# Patient Record
Sex: Male | Born: 1950 | Race: White | Hispanic: No | Marital: Single | Smoking: Former smoker
Health system: Southern US, Community
[De-identification: ages and names within clinical notes are randomized; demographics above are authoritative.]

## PROBLEM LIST (undated history)

## (undated) ENCOUNTER — Emergency Department (HOSPITAL_COMMUNITY): Payer: No Typology Code available for payment source

## (undated) DIAGNOSIS — I219 Acute myocardial infarction, unspecified: Secondary | ICD-10-CM

## (undated) DIAGNOSIS — F419 Anxiety disorder, unspecified: Secondary | ICD-10-CM

## (undated) DIAGNOSIS — J930 Spontaneous tension pneumothorax: Secondary | ICD-10-CM

## (undated) DIAGNOSIS — J449 Chronic obstructive pulmonary disease, unspecified: Secondary | ICD-10-CM

## (undated) DIAGNOSIS — H209 Unspecified iridocyclitis: Secondary | ICD-10-CM

## (undated) DIAGNOSIS — F329 Major depressive disorder, single episode, unspecified: Secondary | ICD-10-CM

## (undated) DIAGNOSIS — J40 Bronchitis, not specified as acute or chronic: Secondary | ICD-10-CM

## (undated) DIAGNOSIS — Z9981 Dependence on supplemental oxygen: Secondary | ICD-10-CM

## (undated) DIAGNOSIS — I10 Essential (primary) hypertension: Secondary | ICD-10-CM

## (undated) DIAGNOSIS — T1491XA Suicide attempt, initial encounter: Secondary | ICD-10-CM

## (undated) DIAGNOSIS — G4733 Obstructive sleep apnea (adult) (pediatric): Secondary | ICD-10-CM

## (undated) DIAGNOSIS — Z923 Personal history of irradiation: Secondary | ICD-10-CM

## (undated) DIAGNOSIS — F32A Depression, unspecified: Secondary | ICD-10-CM

## (undated) DIAGNOSIS — F99 Mental disorder, not otherwise specified: Secondary | ICD-10-CM

## (undated) HISTORY — PX: OTHER SURGICAL HISTORY: SHX169

## (undated) HISTORY — PX: SKIN CANCER EXCISION: SHX779

---

## 2013-01-09 ENCOUNTER — Emergency Department (HOSPITAL_COMMUNITY): Payer: Non-veteran care

## 2013-01-09 ENCOUNTER — Encounter (HOSPITAL_COMMUNITY): Payer: Self-pay | Admitting: Internal Medicine

## 2013-01-09 ENCOUNTER — Observation Stay (HOSPITAL_COMMUNITY)
Admission: EM | Admit: 2013-01-09 | Discharge: 2013-01-11 | Disposition: A | Discharge status: Home / Self Care | Payer: Non-veteran care | Attending: Internal Medicine | Admitting: Internal Medicine

## 2013-01-09 DIAGNOSIS — R0789 Other chest pain: Secondary | ICD-10-CM | POA: Diagnosis not present

## 2013-01-09 DIAGNOSIS — I252 Old myocardial infarction: Secondary | ICD-10-CM

## 2013-01-09 DIAGNOSIS — F329 Major depressive disorder, single episode, unspecified: Secondary | ICD-10-CM | POA: Diagnosis not present

## 2013-01-09 DIAGNOSIS — E669 Obesity, unspecified: Secondary | ICD-10-CM | POA: Diagnosis present

## 2013-01-09 DIAGNOSIS — I10 Essential (primary) hypertension: Secondary | ICD-10-CM | POA: Insufficient documentation

## 2013-01-09 DIAGNOSIS — Z720 Tobacco use: Secondary | ICD-10-CM | POA: Diagnosis present

## 2013-01-09 DIAGNOSIS — Z6833 Body mass index (BMI) 33.0-33.9, adult: Secondary | ICD-10-CM | POA: Insufficient documentation

## 2013-01-09 DIAGNOSIS — F32A Depression, unspecified: Secondary | ICD-10-CM | POA: Diagnosis present

## 2013-01-09 DIAGNOSIS — E1159 Type 2 diabetes mellitus with other circulatory complications: Secondary | ICD-10-CM | POA: Diagnosis present

## 2013-01-09 DIAGNOSIS — F3289 Other specified depressive episodes: Secondary | ICD-10-CM | POA: Insufficient documentation

## 2013-01-09 DIAGNOSIS — F17200 Nicotine dependence, unspecified, uncomplicated: Secondary | ICD-10-CM | POA: Diagnosis not present

## 2013-01-09 DIAGNOSIS — Z23 Encounter for immunization: Secondary | ICD-10-CM | POA: Insufficient documentation

## 2013-01-09 DIAGNOSIS — R079 Chest pain, unspecified: Secondary | ICD-10-CM | POA: Diagnosis present

## 2013-01-09 DIAGNOSIS — R0602 Shortness of breath: Secondary | ICD-10-CM | POA: Insufficient documentation

## 2013-01-09 HISTORY — DX: Essential (primary) hypertension: I10

## 2013-01-09 LAB — CBC
HCT: 45.8 % (ref 39.0–52.0)
Hemoglobin: 16 g/dL (ref 13.0–17.0)
MCH: 31.9 pg (ref 26.0–34.0)
MCHC: 34.9 g/dL (ref 30.0–36.0)
MCV: 91.4 fL (ref 78.0–100.0)
Platelets: 238 10*3/uL (ref 150–400)
RBC: 5.01 MIL/uL (ref 4.22–5.81)
RDW: 13.9 % (ref 11.5–15.5)
WBC: 10.8 10*3/uL — ABNORMAL HIGH (ref 4.0–10.5)

## 2013-01-09 LAB — BASIC METABOLIC PANEL
BUN: 17 mg/dL (ref 6–23)
CO2: 25 mEq/L (ref 19–32)
Calcium: 8.7 mg/dL (ref 8.4–10.5)
Chloride: 103 mEq/L (ref 96–112)
Creatinine, Ser: 0.99 mg/dL (ref 0.50–1.35)
GFR calc Af Amer: >90 mL/min (ref >90)
GFR calc non Af Amer: 87 mL/min — ABNORMAL LOW (ref >90)
Glucose, Bld: 89 mg/dL (ref 70–99)
Potassium: 4.2 mEq/L (ref 3.5–5.1)
Sodium: 138 mEq/L (ref 135–145)

## 2013-01-09 LAB — POCT I-STAT TROPONIN I: Troponin i, poc: 0.02 ng/mL (ref 0.00–0.08)

## 2013-01-09 LAB — PRO B NATRIURETIC PEPTIDE: Pro B Natriuretic peptide (BNP): 229.7 pg/mL — ABNORMAL HIGH (ref 0–125)

## 2013-01-09 MED ORDER — MORPHINE SULFATE 4 MG/ML IJ SOLN
4.0000 mg | Freq: Once | INTRAMUSCULAR | Status: AC
  Administered 2013-01-09: 4 mg via INTRAVENOUS
  Filled 2013-01-09: qty 1

## 2013-01-09 MED ORDER — HYDROMORPHONE HCL PF 1 MG/ML IJ SOLN: 1.0000 mg | INTRAMUSCULAR | Status: AC | PRN

## 2013-01-09 MED ORDER — ONDANSETRON HCL 4 MG/2ML IJ SOLN
4.0000 mg | Freq: Once | INTRAMUSCULAR | Status: AC
  Administered 2013-01-09: 4 mg via INTRAVENOUS
  Filled 2013-01-09: qty 2

## 2013-01-09 MED ORDER — NITROGLYCERIN 0.4 MG SL SUBL
0.4000 mg | SUBLINGUAL_TABLET | SUBLINGUAL | Status: DC | PRN
  Administered 2013-01-09: 0.4 mg via SUBLINGUAL
  Filled 2013-01-09: qty 25

## 2013-01-09 MED ORDER — ONDANSETRON HCL 4 MG/2ML IJ SOLN: 4.0000 mg | Freq: Three times a day (TID) | INTRAMUSCULAR | Status: DC | PRN

## 2013-01-09 MED ORDER — ASPIRIN 325 MG PO TABS
325.0000 mg | ORAL_TABLET | Freq: Every day | ORAL | Status: DC
  Administered 2013-01-09: 325 mg via ORAL
  Filled 2013-01-09: qty 1

## 2013-01-09 NOTE — ED Provider Notes (Signed)
CSN: [id number]     Arrival date & time 01/09/13  2110 History   First MD Initiated Contact with Patient 01/09/13 2206     Chief Complaint  Patient presents with  . Chest Pain   (Consider location/radiation/quality/duration/timing/severity/associated sxs/prior Treatment) Patient is a 62 y.o. male presenting with chest pain. The history is provided by the patient and medical records.  Chest Pain Associated symptoms: diaphoresis and nausea    This is a 62 year old male with past medical history significant for hypertension and BPH, presenting to the ED for midsternal chest pressure with radiation to left side of his jaw.  Patient states "I feel like there is an elephant sitting on my chest".  States he has associated SOB, diaphoresis, and nausea.  No vomiting or palpitations.  No numbness/weakness of extremities.  Patient has no prior cardiac history. States he has had episodes of similar in the past, but has never been formally evaluated. Patient has no family history of heart disease. Patient is a daily smoker, parceling one pack per day which is decreased from his prior 1.5- 2 packs per day.  No intervention PTA.  No past medical history on file. No past surgical history on file. No family history on file. History  Substance Use Topics  . Smoking status: Not on file  . Smokeless tobacco: Not on file  . Alcohol Use: Not on file    Review of Systems  Constitutional: Positive for diaphoresis.  Cardiovascular: Positive for chest pain.  Gastrointestinal: Positive for nausea.  All other systems reviewed and are negative.    Allergies  Demerol; Zocor; and Other  Home Medications   Current Outpatient Rx  Name  Route  Sig  Dispense  Refill  . lisinopril (PRINIVIL,ZESTRIL) 20 MG tablet   Oral   Take 20 mg by mouth daily.         . tamsulosin (FLOMAX) 0.4 MG CAPS capsule   Oral   Take 0.4 mg by mouth.          BP 150/88  Pulse 77  Temp(Src) 97.8 F (36.6 C) (Oral)  Resp  20  Ht 6' 3.5" (1.918 m)  Wt 268 lb (121.564 kg)  BMI 33.05 kg/m2  SpO2 93%  Physical Exam  Nursing note and vitals reviewed. Constitutional: He is oriented to person, place, and time. He appears well-developed and well-nourished. No distress.  HENT:  Head: Normocephalic and atraumatic.  Mouth/Throat: Oropharynx is clear and moist.  Eyes: Conjunctivae and EOM are normal. Pupils are equal, round, and reactive to light.  Neck: Normal range of motion. Neck supple.  Cardiovascular: Normal rate, regular rhythm and normal heart sounds.   Pulmonary/Chest: Effort normal and breath sounds normal. No respiratory distress. He has no wheezes.  Abdominal: Soft. Bowel sounds are normal. There is no tenderness. There is no guarding.  Musculoskeletal: Normal range of motion. He exhibits edema.  Trace edema BLE  Neurological: He is alert and oriented to person, place, and time. He has normal strength. He displays no tremor. No cranial nerve deficit or sensory deficit. He displays no seizure activity.  CN grossly intact, moves all extremities appropriately without ataxia, no focal neuro deficits or facial droop appreciated  Skin: Skin is warm and dry. He is not diaphoretic.  Psychiatric: He has a normal mood and affect.    ED Course  Procedures (including critical care time)   Date: 01/10/2013  Rate: 91  Rhythm: normal sinus rhythm  QRS Axis: normal  Intervals: normal  ST/T  Wave abnormalities: normal  Conduction Disutrbances:none  Narrative Interpretation:   Old EKG Reviewed: none available  Labs Review Labs Reviewed  CBC - Abnormal; Notable for the following:    WBC 10.8 (*)    All other components within normal limits  BASIC METABOLIC PANEL - Abnormal; Notable for the following:    GFR calc non Af Amer 87 (*)    All other components within normal limits  PRO B NATRIURETIC PEPTIDE - Abnormal; Notable for the following:    Pro B Natriuretic peptide (BNP) 229.7 (*)    All other  components within normal limits  POCT I-STAT TROPONIN I   Imaging Review Dg Chest 2 View  01/09/2013   CLINICAL DATA:  Chest pain.  EXAM: CHEST  2 VIEW  COMPARISON:  None.  FINDINGS: Normal sized heart. Small amount of linear density at the left lung base. Otherwise, clear lungs. The lungs are mildly hyperexpanded. Mild central peribronchial thickening. Mild thoracic spine degenerative changes.  IMPRESSION: 1. Mild changes of COPD and chronic bronchitis. 2. Minimal left basilar linear atelectasis or scarring.   Electronically Signed   By: Gordan Payment M.D.   On: 01/09/2013 23:24    EKG Interpretation   None       MDM   1. Chest pain    EKG normal sinus rhythm, no acute ischemic changes. Troponin negative. Labs as above, largely within normal limits. BNP mildly elevated at 229. Patient given ASA and 2 SL nitroglycerin with resolution of his pain, but did cause a headache.  Given pts sx on arrival and story of prior instances of the same, i feel he would benefit from admission for close monitoring and serial enzymes.  Consulted unassigned, spoke with Dr, Toniann Fail-- pt to be admitted to Telemetry.  Temp admit orders placed.  VS stable for transport to floor.  Garlon Hatchet, PA-C 01/10/13 [phone]

## 2013-01-09 NOTE — ED Notes (Signed)
Pt sts dizziness is worsening. And feels like he is going to pass out.

## 2013-01-09 NOTE — ED Notes (Signed)
Pt c/o substernal CP that radiates to jaw, with diaphoresis, nausea. He states that pain has decreased.

## 2013-01-10 ENCOUNTER — Encounter (HOSPITAL_COMMUNITY): Payer: Self-pay | Admitting: Internal Medicine

## 2013-01-10 DIAGNOSIS — I10 Essential (primary) hypertension: Secondary | ICD-10-CM | POA: Diagnosis present

## 2013-01-10 DIAGNOSIS — E1159 Type 2 diabetes mellitus with other circulatory complications: Secondary | ICD-10-CM | POA: Diagnosis present

## 2013-01-10 DIAGNOSIS — R079 Chest pain, unspecified: Secondary | ICD-10-CM | POA: Diagnosis present

## 2013-01-10 LAB — BASIC METABOLIC PANEL
BUN: 16 mg/dL (ref 6–23)
CO2: 24 mEq/L (ref 19–32)
Calcium: 8.4 mg/dL (ref 8.4–10.5)
Chloride: 103 mEq/L (ref 96–112)
Creatinine, Ser: 0.88 mg/dL (ref 0.50–1.35)
GFR calc Af Amer: >90 mL/min (ref >90)
GFR calc non Af Amer: >90 mL/min (ref >90)
Glucose, Bld: 85 mg/dL (ref 70–99)
Potassium: 3.8 mEq/L (ref 3.5–5.1)
Sodium: 137 mEq/L (ref 135–145)

## 2013-01-10 LAB — CBC
HCT: 43.3 % (ref 39.0–52.0)
Hemoglobin: 14.8 g/dL (ref 13.0–17.0)
MCH: 31.4 pg (ref 26.0–34.0)
MCHC: 34.2 g/dL (ref 30.0–36.0)
MCV: 91.7 fL (ref 78.0–100.0)
Platelets: 205 10*3/uL (ref 150–400)
RBC: 4.72 MIL/uL (ref 4.22–5.81)
RDW: 13.8 % (ref 11.5–15.5)
WBC: 12 10*3/uL — ABNORMAL HIGH (ref 4.0–10.5)

## 2013-01-10 LAB — HEPATIC FUNCTION PANEL
ALT: 11 U/L (ref 0–53)
AST: 18 U/L (ref 0–37)
Albumin: 3.1 g/dL — ABNORMAL LOW (ref 3.5–5.2)
Alkaline Phosphatase: 87 U/L (ref 39–117)
Bilirubin, Direct: <0.1 mg/dL (ref 0.0–0.3)
Total Bilirubin: 0.3 mg/dL (ref 0.3–1.2)
Total Protein: 6.3 g/dL (ref 6.0–8.3)

## 2013-01-10 LAB — TROPONIN I
Troponin I: <0.3 ng/mL (ref <0.30)
Troponin I: <0.3 ng/mL (ref <0.30)
Troponin I: <0.3 ng/mL (ref <0.30)

## 2013-01-10 LAB — D-DIMER, QUANTITATIVE: D-Dimer, Quant: 0.32 ug/mL-FEU (ref 0.00–0.48)

## 2013-01-10 MED ORDER — HYDRALAZINE HCL 20 MG/ML IJ SOLN: 10.0000 mg | INTRAMUSCULAR | Status: DC | PRN

## 2013-01-10 MED ORDER — ENOXAPARIN SODIUM 40 MG/0.4ML ~~LOC~~ SOLN
40.0000 mg | SUBCUTANEOUS | Status: DC
  Administered 2013-01-10 – 2013-01-11 (×2): 40 mg via SUBCUTANEOUS
  Filled 2013-01-10 (×2): qty 0.4

## 2013-01-10 MED ORDER — SODIUM CHLORIDE 0.9 % IJ SOLN
3.0000 mL | Freq: Two times a day (BID) | INTRAMUSCULAR | Status: DC
  Administered 2013-01-10 – 2013-01-11 (×3): 3 mL via INTRAVENOUS

## 2013-01-10 MED ORDER — SODIUM CHLORIDE 0.9 % IJ SOLN
3.0000 mL | Freq: Two times a day (BID) | INTRAMUSCULAR | Status: DC
  Administered 2013-01-10: 3 mL via INTRAVENOUS

## 2013-01-10 MED ORDER — INFLUENZA VAC SPLIT QUAD 0.5 ML IM SUSP
0.5000 mL | INTRAMUSCULAR | Status: AC
  Administered 2013-01-11: 0.5 mL via INTRAMUSCULAR
  Filled 2013-01-10: qty 0.5

## 2013-01-10 MED ORDER — ASPIRIN EC 81 MG PO TBEC
81.0000 mg | DELAYED_RELEASE_TABLET | Freq: Every day | ORAL | Status: DC
  Administered 2013-01-10 – 2013-01-11 (×2): 81 mg via ORAL
  Filled 2013-01-10 (×2): qty 1

## 2013-01-10 MED ORDER — ACETAMINOPHEN 325 MG PO TABS
650.0000 mg | ORAL_TABLET | Freq: Four times a day (QID) | ORAL | Status: DC | PRN
  Administered 2013-01-10 (×2): 650 mg via ORAL
  Filled 2013-01-10 (×2): qty 2

## 2013-01-10 MED ORDER — ONDANSETRON HCL 4 MG PO TABS: 4.0000 mg | ORAL_TABLET | Freq: Four times a day (QID) | ORAL | Status: DC | PRN

## 2013-01-10 MED ORDER — ONDANSETRON HCL 4 MG/2ML IJ SOLN: 4.0000 mg | Freq: Four times a day (QID) | INTRAMUSCULAR | Status: DC | PRN

## 2013-01-10 MED ORDER — TAMSULOSIN HCL 0.4 MG PO CAPS
0.4000 mg | ORAL_CAPSULE | Freq: Every day | ORAL | Status: DC
  Administered 2013-01-10 – 2013-01-11 (×2): 0.4 mg via ORAL
  Filled 2013-01-10 (×2): qty 1

## 2013-01-10 MED ORDER — ASPIRIN EC 325 MG PO TBEC
325.0000 mg | DELAYED_RELEASE_TABLET | Freq: Every day | ORAL | Status: DC
  Administered 2013-01-10: 325 mg via ORAL
  Filled 2013-01-10: qty 1

## 2013-01-10 MED ORDER — PNEUMOCOCCAL VAC POLYVALENT 25 MCG/0.5ML IJ INJ
0.5000 mL | INJECTION | INTRAMUSCULAR | Status: AC
  Administered 2013-01-11: 0.5 mL via INTRAMUSCULAR
  Filled 2013-01-10: qty 0.5

## 2013-01-10 MED ORDER — ACETAMINOPHEN 650 MG RE SUPP: 650.0000 mg | Freq: Four times a day (QID) | RECTAL | Status: DC | PRN

## 2013-01-10 NOTE — H&P (Signed)
[facility] History and Physical  Mario [name] ZOX:[id number] DOB: 01/15/1951 DOA: 01/09/2013  Referring physician: ER physician. PCP: No PCP Per Patient   Chief Complaint: Chest pain.  HPI: Mario [name] is a 61 y.o. male with history of hypertension presently on no medications for last one year due to financial issues, ongoing tobacco abuse presented to the ER because of chest pain. Patient was driving car at around 8 PM when he started developing retrosternal chest pain which was associated with shortness of breath and nausea. Chest pain did not get better he came to the ER. EKG and cardiac markers were negative and chest x-ray did not show anything acute. Patient's chest pain got better with nitroglycerin and has been admitted for further management. Patient denies any abdominal pain diarrhea fever chills productive cough.   Review of Systems: As presented in the history of presenting illness, rest negative.  Past Medical History  Diagnosis Date  . Hypertension    Past Surgical History  Procedure Laterality Date  . Skin cancer excision    . Cryptorchidism     Social History:  reports that he has been smoking.  He does not have any smokeless tobacco history on file. He reports that he does not drink alcohol or use illicit drugs. Where does patient live home. Can patient participate in ADLs? Yes.  Allergies  Allergen Reactions  . Demerol [Meperidine] Nausea And Vomiting    Violently sick  . Zocor [Simvastatin] Other (See Comments)    Made him very jittery, nausea and vomiting  . Other Other (See Comments)    Some antibiotic-not sure which one-caused nausea and vomiting.      Family History:  Family History  Problem Relation Age of Onset  . Dementia Father       Prior to Admission medications   Medication Sig Start Date End Date Taking? Authorizing Provider  lisinopril (PRINIVIL,ZESTRIL) 20 MG tablet Take 20 mg by mouth daily.    Historical Provider, MD   tamsulosin (FLOMAX) 0.4 MG CAPS capsule Take 0.4 mg by mouth.    Historical Provider, MD    Physical Exam: Filed Vitals:   01/09/13 2310 01/09/13 2315 01/09/13 2330 01/10/13 0018  BP: 130/80 150/88 153/96 132/93  Pulse: 85 77 70 71  Temp:      TempSrc:      Resp:  20 17 18   Height:      Weight:      SpO2:  93% 96% 95%     General:  Well-developed and nourished.  Eyes: Anicteric no pallor.  ENT: No discharge from ears eyes nose mouth.  Neck: No mass felt.  Cardiovascular: S1-S2 heard.  Respiratory: No rhonchi or crepitations.  Abdomen: Soft nontender bowel sounds present.  Skin: Chronic skin changes in the lower extremities.  Musculoskeletal: No edema.  Psychiatric: Appears normal.  Neurologic: Alert awake oriented to time place and person. Moves all extremities.  Labs on Admission:  Basic Metabolic Panel:  Recent Labs Lab 01/09/13 2123  NA 138  K 4.2  CL 103  CO2 25  GLUCOSE 89  BUN 17  CREATININE 0.99  CALCIUM 8.7   Liver Function Tests: No results found for this basename: AST, ALT, ALKPHOS, BILITOT, PROT, ALBUMIN,  in the last 168 hours No results found for this basename: LIPASE, AMYLASE,  in the last 168 hours No results found for this basename: AMMONIA,  in the last 168 hours CBC:  Recent Labs Lab 01/09/13 2123  WBC 10.8*  HGB 16.0  HCT 45.8  MCV 91.4  PLT 238   Cardiac Enzymes: No results found for this basename: CKTOTAL, CKMB, CKMBINDEX, TROPONINI,  in the last 168 hours  BNP (last 3 results)  Recent Labs  01/09/13 2122  PROBNP 229.7*   CBG: No results found for this basename: GLUCAP,  in the last 168 hours  Radiological Exams on Admission: Dg Chest 2 View  01/09/2013   CLINICAL DATA:  Chest pain.  EXAM: CHEST  2 VIEW  COMPARISON:  None.  FINDINGS: Normal sized heart. Small amount of linear density at the left lung base. Otherwise, clear lungs. The lungs are mildly hyperexpanded. Mild central peribronchial thickening. Mild  thoracic spine degenerative changes.  IMPRESSION: 1. Mild changes of COPD and chronic bronchitis. 2. Minimal left basilar linear atelectasis or scarring.   Electronically Signed   By: Gordan Payment M.D.   On: 01/09/2013 23:24    EKG: Independently reviewed. Normal sinus rhythm.  Assessment/Plan Principal Problem:   Chest pain Active Problems:   HTN (hypertension)   1. Chest pain - patient's chest pain has typical features and with history of ongoing tobacco abuse and history of hypertension we will cycle cardiac markers check 2-D echo aspirin when necessary nitroglycerin and check d-dimer. 2. Hypertension - noncompliant with medications due to financial issues. For now I have placed patient on when necessary IV hydralazine for systolic blood pressure more than 160. Based on patient's blood pressure trends patient will need different antihypertensives eventually. 3. Tobacco abuse - strongly advised to quit smoking.    Code Status: Full code.  Family Communication: None.  Disposition Plan: Admit for observation.    Sandrika Schwinn N. [facility] Pager [phone].  If 7PM-7AM, please contact night-coverage [email or web address] Password [facility] 01/10/2013, 12:31 AM

## 2013-01-10 NOTE — ED Provider Notes (Signed)
Medical screening examination/treatment/procedure(s) were performed by non-physician practitioner and as supervising physician I was immediately available for consultation/collaboration.  EKG Interpretation     Ventricular Rate:    PR Interval:    QRS Duration:   QT Interval:    QTC Calculation:   R Axis:     Text Interpretation:                Junius Argyle, MD 01/10/13 [phone]

## 2013-01-10 NOTE — Progress Notes (Signed)
Nutrition Brief Note  Patient identified on the Malnutrition Screening Tool (MST) Report for recent weight lost without trying (patient unsure) and eating poorly because of a decreased appetite.  Wt Readings from Last 15 Encounters:  01/10/13 266 lb 4.8 oz (120.793 kg)    Body mass index is 32.84 kg/(m^2). Patient meets criteria for Obesity Class I based on current BMI.   Current diet order is Heart Healthy, patient is consuming approximately 75% of meals at this time. Labs and medications reviewed.   No nutrition interventions warranted at this time. If nutrition issues arise, please consult RD.   Maureen Chatters, RD, LDN Pager #: [phone] After-Hours Pager #: [phone]

## 2013-01-10 NOTE — Progress Notes (Signed)
Patient admitted after midnight.  Chart reviewed.  Feels exhausted. Occasional shortness of breath. No chest pain. Not interested in quitting smoking. No history of previous stress test. No PCP. "Gave up" when he lost his job. Has been very depressed. Prefers to have inpatient ischemia workup rather than outpatient followup. Have discussed the case with Dr. Excell Seltzer. D-dimer normal. Cardiac enzymes negative. Echo not yet done. Would benefit from a Myoview.  He agrees to scheduling a Myoview for tomorrow. No need for them to console unless abnormal. Will discuss treatment of depression with the patient further tomorrow. He reports having been on antidepressants in the past.  Crista Curb M.D. [phone]

## 2013-01-10 NOTE — Progress Notes (Signed)
UR COMPLETED  

## 2013-01-11 ENCOUNTER — Emergency Department (HOSPITAL_COMMUNITY)
Admission: EM | Admit: 2013-01-11 | Discharge: 2013-01-12 | Disposition: A | Discharge status: Other Acute Inpatient Hospital | Payer: Non-veteran care | Attending: Emergency Medicine | Admitting: Emergency Medicine

## 2013-01-11 ENCOUNTER — Observation Stay (HOSPITAL_COMMUNITY): Payer: Non-veteran care

## 2013-01-11 ENCOUNTER — Encounter (HOSPITAL_COMMUNITY): Payer: Non-veteran care

## 2013-01-11 DIAGNOSIS — F329 Major depressive disorder, single episode, unspecified: Secondary | ICD-10-CM | POA: Diagnosis present

## 2013-01-11 DIAGNOSIS — Z720 Tobacco use: Secondary | ICD-10-CM | POA: Diagnosis present

## 2013-01-11 DIAGNOSIS — R079 Chest pain, unspecified: Secondary | ICD-10-CM

## 2013-01-11 DIAGNOSIS — Z79899 Other long term (current) drug therapy: Secondary | ICD-10-CM | POA: Insufficient documentation

## 2013-01-11 DIAGNOSIS — Z7982 Long term (current) use of aspirin: Secondary | ICD-10-CM | POA: Insufficient documentation

## 2013-01-11 DIAGNOSIS — E669 Obesity, unspecified: Secondary | ICD-10-CM | POA: Diagnosis present

## 2013-01-11 DIAGNOSIS — I10 Essential (primary) hypertension: Secondary | ICD-10-CM | POA: Insufficient documentation

## 2013-01-11 DIAGNOSIS — R45851 Suicidal ideations: Secondary | ICD-10-CM

## 2013-01-11 DIAGNOSIS — F32A Depression, unspecified: Secondary | ICD-10-CM | POA: Diagnosis present

## 2013-01-11 DIAGNOSIS — I252 Old myocardial infarction: Secondary | ICD-10-CM

## 2013-01-11 DIAGNOSIS — F17200 Nicotine dependence, unspecified, uncomplicated: Secondary | ICD-10-CM | POA: Insufficient documentation

## 2013-01-11 LAB — LIPID PANEL
Cholesterol: 136 mg/dL (ref 0–200)
HDL: 29 mg/dL — ABNORMAL LOW (ref >39)
LDL Cholesterol: 90 mg/dL (ref 0–99)
Total CHOL/HDL Ratio: 4.7 RATIO
Triglycerides: 86 mg/dL (ref <150)
VLDL: 17 mg/dL (ref 0–40)

## 2013-01-11 LAB — TSH: TSH: 1.111 u[IU]/mL (ref 0.350–4.500)

## 2013-01-11 MED ORDER — REGADENOSON 0.4 MG/5ML IV SOLN
0.4000 mg | Freq: Once | INTRAVENOUS | Status: AC
  Administered 2013-01-11: 0.4 mg via INTRAVENOUS

## 2013-01-11 MED ORDER — BUPROPION HCL ER (XL) 150 MG PO TB24: 150.0000 mg | ORAL_TABLET | Freq: Every day | ORAL | Status: DC

## 2013-01-11 MED ORDER — TECHNETIUM TC 99M SESTAMIBI GENERIC - CARDIOLITE
30.0000 | Freq: Once | INTRAVENOUS | Status: AC | PRN
  Administered 2013-01-11: 30 via INTRAVENOUS

## 2013-01-11 MED ORDER — TECHNETIUM TC 99M SESTAMIBI GENERIC - CARDIOLITE
10.0000 | Freq: Once | INTRAVENOUS | Status: AC | PRN
  Administered 2013-01-11: 10 via INTRAVENOUS

## 2013-01-11 MED ORDER — REGADENOSON 0.4 MG/5ML IV SOLN
INTRAVENOUS | Status: AC
  Filled 2013-01-11: qty 5

## 2013-01-11 MED ORDER — LISINOPRIL 20 MG PO TABS: 20.0000 mg | ORAL_TABLET | Freq: Every day | ORAL | Status: DC

## 2013-01-11 MED ORDER — ASPIRIN 81 MG PO TBEC: 81.0000 mg | DELAYED_RELEASE_TABLET | Freq: Every day | ORAL | Status: DC

## 2013-01-11 NOTE — Care Management Note (Signed)
    Page 1 of 1   01/11/2013     5:24:04 PM   CARE MANAGEMENT NOTE 01/11/2013  Patient:  Bjorklund,Page   Account Number:  [id number]  Date Initiated:  01/11/2013  Documentation initiated by:  Dossie Ocanas  Subjective/Objective Assessment:   PT ADM ON 01/11/13 WITH CHEST PAIN.  PTA, PT INDEPENDENT OF ADLS.     Action/Plan:   Anticipated DC Date:  01/11/2013   Anticipated DC Plan:  HOME/SELF CARE      DC Planning Services  CM consult  MATCH Program  [facility]      Choice offered to / List presented to:             Status of service:  Completed, signed off Medicare Important Message given?   (If response is "NO", the following Medicare IM given date fields will be blank) Date Medicare IM given:   Date Additional Medicare IM given:    Discharge Disposition:  HOME/SELF CARE  Per UR Regulation:  Reviewed for med. necessity/level of care/duration of stay  If discussed at Long Length of Stay Meetings, dates discussed:    Comments:  01/11/13 Nahomi Hegner,RN,BSN [phone] PT FOR DC TODAY.  MATCH LETTER GIVEN, AS HAS NO INSURANCE. PT HAS NO PCP; FO[facility].  APPT INFO ON AVS.  CSW CONSULTED FOR HOMELESS ISSUES.

## 2013-01-11 NOTE — Progress Notes (Signed)
Chaplain responded to spiritual care consult. Pt was sitting on side of bed, said he was "terrible."   Pt's primary concern is being discharged today and having no where to go. Pt explained that he lost his job when his health declined, then lost his apartment, and is low on gas in his car. Pt's family lives out of state.   Chaplain consulted with CSW. [place] is working with pt on finding shelter. Pt is a Cytogeneticist.   Pt said he feels "down, hopeless, and helpless."  Pt said, "I've thought about just ending it. I actually tried a couple of weeks ago. I put a plastic bag over my head, but I couldn't take it." Chaplain explained that medical team's goal is to keep pt safe and if he is not safe, chaplain would need to speak with pt's nurse. Chaplain asked if pt was safe to leave the hospital. Pt said, "I don't know. I don't know what I will do. Maybe just freeze to death."   Chaplain shared this conversation with pt's RN, Belenda Cruise, who said that "Dr. Lendell Caprice addressed this completely yesterday with the pt and cleared pt for discharge."  Maurene Capes, [place] [phone]

## 2013-01-11 NOTE — Progress Notes (Addendum)
01/11/13 Nursing Note Social work has given patient extended list of shelters/ and over night stay opportunities, Patient has called several places without success.  Dr Lendell Caprice Made aware and discharge order inplace  Patient agreeable to discharge, AVS paper prescriptions and medication list given and reviewed with patient. Patient states that his phone with contacts is in his car and he plans to call the paster of [place] [employer], Offered patient to have security assist to find car in parking lot. Patient declined at this time. Again offered to patient Per social worker patient could sit in ED waiting room until the AM and try shelters again in the morning. Patient again stated that he would try to find a place to stay. Will discharge home as ordered. 13 Spoke with on call SW Jodie and made her aware in case patient decided to stay in ED as previously offered.  Mario [name], [employer]

## 2013-01-11 NOTE — ED Notes (Signed)
Spoke at length with pt in ED.  Pt unable to secure housing this pm, but has plenty of resources.  Pt to meet with a pastor in the am re: his situation and follow up with area shelters.  Pt counseled on applying for disability/EBT benefits etc.  Pt plans on sleeping in car this pm/possibly warming up in the ED waiting room from time to time.  [employer] informed.  Case discussed with ED Director/AD.  Emotional support offered.

## 2013-01-11 NOTE — Discharge Summary (Signed)
Physician Discharge Summary  Mario [name] ZOX:[id number] DOB: [birth year] DOA: 01/09/2013  PCP: No PCP Per Patient  Admit date: 01/09/2013 Discharge date: 01/11/2013   Recommendations for Outpatient Follow-up:  1. Monitor depression symptoms on wellbutrin  Discharge Diagnoses:     Chest pain, noncardiac    HTN (hypertension)    Depression    Tobacco abuse    Obesity, unspecified    Old MI (myocardial infarction)   Discharge Condition: stable  Filed Weights   01/09/13 2216 01/10/13 0117  Weight: 121.564 kg (268 lb) 120.793 kg (266 lb 4.8 oz)    History of present illness:  62 y.o. male with history of hypertension presently on no medications for last one year due to financial issues, ongoing tobacco abuse presented to the ER because of chest pain. Patient was driving car at around 8 PM when he started developing retrosternal chest pain which was associated with shortness of breath and nausea. Chest pain did not get better he came to the ER. EKG and cardiac markers were negative and chest x-ray did not show anything acute. Patient's chest pain got better with nitroglycerin and has been admitted for further management. Patient denies any abdominal pain diarrhea fever chills productive cough.   Hospital Course:     Chest pain: MI ruled out. D dimer normal. Started on aspirin. Needs to quit smoking.  Myoview showed scar without inducible ischemia. EF 48%. No further chest pain.  LDL less than 100, and reports intolerance to zocor    HTN (hypertension):  Started back on lisinopril. Had been off meds for months    Depression: has lost his job, been evicted from home, has had depression in the past.  Denied active suicidal ideation. Able to contract for safety. Consulted social work for homelessness and community resources. Has been on paxil in the past.  Did not help. Started Wellbutrin and will arrange close follow up.  Care management assisted with discharge meds    Tobacco  abuse, counselled against    Obesity, unspecified    Old MI (myocardial infarction)  Procedures:  none  Consultations:  none  Discharge Exam: Filed Vitals:   01/11/13 1118  BP: 161/86  Pulse: 77  Temp:   Resp:     General: alert, oriented.  Cardiovascular: RRR without WRR Respiratory: CTA without WRR Psych:  Good eye contact. Affect flat, but occasionally cracking jokes. Talkative.  Discharge Instructions  Discharge Orders   Future Orders Complete By Expires   Activity as tolerated - No restrictions  As directed    Diet - low sodium heart healthy  As directed    Discharge instructions  As directed    Comments:     Quit smoking       Medication List         aspirin 81 MG EC tablet  Take 1 tablet (81 mg total) by mouth daily.     buPROPion 150 MG 24 hr tablet  Commonly known as:  WELLBUTRIN XL  Take 1 tablet (150 mg total) by mouth daily.     lisinopril 20 MG tablet  Commonly known as:  PRINIVIL,ZESTRIL  Take 1 tablet (20 mg total) by mouth daily.     tamsulosin 0.4 MG Caps capsule  Commonly known as:  FLOMAX  Take 0.4 mg by mouth.       Allergies  Allergen Reactions  . Demerol [Meperidine] Nausea And Vomiting    Violently sick  . Zocor [Simvastatin] Other (See Comments)    Made  him very jittery, nausea and vomiting  . Other Other (See Comments)    Some antibiotic-not sure which one-caused nausea and vomiting.         Follow-up Information   Follow up with [facility]    .   Contact information:   [address] [place] [address] [phone]       The results of significant diagnostics from this hospitalization (including imaging, microbiology, ancillary and laboratory) are listed below for reference.    Significant Diagnostic Studies: Dg Chest 2 View  01/09/2013   CLINICAL DATA:  Chest pain.  EXAM: CHEST  2 VIEW  COMPARISON:  None.  FINDINGS: Normal sized heart. Small amount of linear  density at the left lung base. Otherwise, clear lungs. The lungs are mildly hyperexpanded. Mild central peribronchial thickening. Mild thoracic spine degenerative changes.  IMPRESSION: 1. Mild changes of COPD and chronic bronchitis. 2. Minimal left basilar linear atelectasis or scarring.   Electronically Signed   By: Gordan Payment M.D.   On: 01/09/2013 23:24   Nm Myocar Multi W/spect W/wall Motion / Ef  01/11/2013   CLINICAL DATA:  Chest pain and hypertension.  EXAM: MYOCARDIAL IMAGING WITH SPECT (REST AND PHARMACOLOGIC-STRESS)  GATED LEFT VENTRICULAR WALL MOTION STUDY  LEFT VENTRICULAR EJECTION FRACTION  TECHNIQUE: Standard myocardial SPECT imaging was performed after resting intravenous injection of 10 mCi Tc-35m sestamibi. Subsequently, intravenous infusion of Lexiscan was performed under the supervision of the Cardiology staff. At peak effect of the drug, 30 mCi Tc-7m sestamibi was injected intravenously and standard myocardial SPECT imaging was performed. Quantitative gated imaging was also performed to evaluate left ventricular wall motion, and estimate left ventricular ejection fraction.  COMPARISON:  Chest x-ray 01/09/2013  FINDINGS: A remote inferior wall infarct is present. There is a matching defect at rest and stress. Moderate hypokinesis is present along the inferior wall. Remainder of the heart demonstrates normal perfusion and contractility.  The calculated ejection fraction is 48% with no estimated diastolic volume of 157 mL and end estimated systolic volume of 82 mL.  IMPRESSION: 1. 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 2. No evidence for acute ischemia or reversible perfusion. 3. Otherwise normal contractility. 4. Low normal cardiac function with an estimated ejection fraction of 48%.   Electronically Signed   By: Gennette Pac M.D.   On: 01/11/2013 13:46   EKG:  Normal sinus rhythm Normal ECG  Microbiology: No results found  for this or any previous visit (from the past 240 hour(s)).   Labs: Basic Metabolic Panel:  Recent Labs Lab 01/09/13 2123 01/10/13 0223  NA 138 137  K 4.2 3.8  CL 103 103  CO2 25 24  GLUCOSE 89 85  BUN 17 16  CREATININE 0.99 0.88  CALCIUM 8.7 8.4   Liver Function Tests:  Recent Labs Lab 01/10/13 0223  AST 18  ALT 11  ALKPHOS 87  BILITOT 0.3  PROT 6.3  ALBUMIN 3.1*   No results found for this basename: LIPASE, AMYLASE,  in the last 168 hours No results found for this basename: AMMONIA,  in the last 168 hours CBC:  Recent Labs Lab 01/09/13 2123 01/10/13 0223  WBC 10.8* 12.0*  HGB 16.0 14.8  HCT 45.8 43.3  MCV 91.4 91.7  PLT 238 205   Cardiac Enzymes:  Recent Labs Lab 01/10/13 0223 01/10/13 0705 01/10/13 1430  TROPONINI <0.30 <0.30 <0.30   BNP: BNP (last 3  results)  Recent Labs  01/09/13 2122  PROBNP 229.7*   CBG: No results found for this basename: GLUCAP,  in the last 168 hours     Signed:  Laurine Kuyper L  [facility] 01/11/2013, 2:38 PM

## 2013-01-11 NOTE — Progress Notes (Signed)
Clinical Social Work Department BRIEF PSYCHOSOCIAL ASSESSMENT 01/11/2013  Patient:  Mario [name],Mario [name]     Account Number:  [id number]     Admit date:  01/09/2013  Clinical Social Worker:  Harless Nakayama  Date/Time:  01/11/2013 03:00 PM  Referred by:  Physician  Date Referred:  01/11/2013 Referred for  Homelessness   Other Referral:   Interview type:  Patient Other interview type:    PSYCHOSOCIAL DATA Living Status:  ALONE Admitted from facility:   Level of care:   Primary support name:   Primary support relationship to patient:   Degree of support available:   Pt reports having limited to no support    CURRENT CONCERNS Current Concerns  Other - See comment   Other Concerns:   Homeless    SOCIAL WORK ASSESSMENT / PLAN CSW informed that pt is ready for dc and has no discharge location. CSW spoke with pt who repors he was living in an apartment prior to hospitalization but he no longer has that apartment. CSW provided pt with shelther list and asked pt to call shelters from his room. CSW did call [facility] for pt but they do not have any beds available at this time, they advised to keep checking. CSW staffed with supervisor who recommended calling [facility] in [place]. CSW call and left message for intake. CSW to continue to follow up with pt.   Assessment/plan status:  Psychosocial Support/Ongoing Assessment of Needs Other assessment/ plan:   Information/referral to community resources:   Homeless resources    PATIENT'S/FAMILY'S RESPONSE TO PLAN OF CARE: Pt was thankful for CSW support but doubtful on being able to find a dc location.       Chaney Maclaren, LCSWA [phone]

## 2013-01-12 ENCOUNTER — Inpatient Hospital Stay (HOSPITAL_COMMUNITY)
Admission: AD | Admit: 2013-01-12 | Discharge: 2013-01-21 | DRG: 885 | Disposition: A | Discharge status: Home / Self Care | Payer: Self-pay | Source: Intra-hospital | Attending: Psychiatry | Admitting: Psychiatry

## 2013-01-12 ENCOUNTER — Inpatient Hospital Stay: Admission: AD | Admit: 2013-01-12 | Payer: Self-pay | Source: Intra-hospital | Admitting: Psychiatry

## 2013-01-12 ENCOUNTER — Encounter (HOSPITAL_COMMUNITY): Payer: Self-pay

## 2013-01-12 ENCOUNTER — Encounter (HOSPITAL_COMMUNITY): Payer: Self-pay | Admitting: Emergency Medicine

## 2013-01-12 DIAGNOSIS — I10 Essential (primary) hypertension: Secondary | ICD-10-CM | POA: Diagnosis present

## 2013-01-12 DIAGNOSIS — Z79899 Other long term (current) drug therapy: Secondary | ICD-10-CM

## 2013-01-12 DIAGNOSIS — Z5900 Homelessness unspecified: Secondary | ICD-10-CM

## 2013-01-12 DIAGNOSIS — Z765 Malingerer [conscious simulation]: Secondary | ICD-10-CM

## 2013-01-12 DIAGNOSIS — F332 Major depressive disorder, recurrent severe without psychotic features: Principal | ICD-10-CM | POA: Diagnosis present

## 2013-01-12 DIAGNOSIS — R45851 Suicidal ideations: Secondary | ICD-10-CM

## 2013-01-12 HISTORY — DX: Bronchitis, not specified as acute or chronic: J40

## 2013-01-12 LAB — URINALYSIS, ROUTINE W REFLEX MICROSCOPIC
Bilirubin Urine: NEGATIVE
Glucose, UA: NEGATIVE mg/dL
Hgb urine dipstick: NEGATIVE
Ketones, ur: NEGATIVE mg/dL
Leukocytes, UA: NEGATIVE
Nitrite: NEGATIVE
Protein, ur: NEGATIVE mg/dL
Specific Gravity, Urine: 1.026 (ref 1.005–1.030)
Urobilinogen, UA: 1 mg/dL (ref 0.0–1.0)
pH: 7 (ref 5.0–8.0)

## 2013-01-12 LAB — BASIC METABOLIC PANEL
BUN: 14 mg/dL (ref 6–23)
CO2: 28 mEq/L (ref 19–32)
Calcium: 9 mg/dL (ref 8.4–10.5)
Chloride: 99 mEq/L (ref 96–112)
Creatinine, Ser: 0.95 mg/dL (ref 0.50–1.35)
GFR calc Af Amer: >90 mL/min (ref >90)
GFR calc non Af Amer: 88 mL/min — ABNORMAL LOW (ref >90)
Glucose, Bld: 94 mg/dL (ref 70–99)
Potassium: 4.3 mEq/L (ref 3.5–5.1)
Sodium: 134 mEq/L — ABNORMAL LOW (ref 135–145)

## 2013-01-12 LAB — CBC WITH DIFFERENTIAL/PLATELET
Basophils Absolute: 0.1 10*3/uL (ref 0.0–0.1)
Basophils Relative: 0 % (ref 0–1)
Eosinophils Absolute: 0.1 10*3/uL (ref 0.0–0.7)
Eosinophils Relative: 1 % (ref 0–5)
HCT: 47.3 % (ref 39.0–52.0)
Hemoglobin: 16.4 g/dL (ref 13.0–17.0)
Lymphocytes Relative: 33 % (ref 12–46)
Lymphs Abs: 4.2 10*3/uL — ABNORMAL HIGH (ref 0.7–4.0)
MCH: 31.5 pg (ref 26.0–34.0)
MCHC: 34.7 g/dL (ref 30.0–36.0)
MCV: 91 fL (ref 78.0–100.0)
Monocytes Absolute: 1 10*3/uL (ref 0.1–1.0)
Monocytes Relative: 8 % (ref 3–12)
Neutro Abs: 7.3 10*3/uL (ref 1.7–7.7)
Neutrophils Relative %: 58 % (ref 43–77)
Platelets: 222 10*3/uL (ref 150–400)
RBC: 5.2 MIL/uL (ref 4.22–5.81)
RDW: 13.6 % (ref 11.5–15.5)
WBC: 12.6 10*3/uL — ABNORMAL HIGH (ref 4.0–10.5)

## 2013-01-12 LAB — RAPID URINE DRUG SCREEN, HOSP PERFORMED
Amphetamines: NOT DETECTED
Barbiturates: NOT DETECTED
Benzodiazepines: NOT DETECTED
Cocaine: NOT DETECTED
Opiates: NOT DETECTED
Tetrahydrocannabinol: NOT DETECTED

## 2013-01-12 LAB — ETHANOL: Alcohol, Ethyl (B): <11 mg/dL (ref 0–11)

## 2013-01-12 MED ORDER — LISINOPRIL 20 MG PO TABS
20.0000 mg | ORAL_TABLET | Freq: Every day | ORAL | Status: DC
  Filled 2013-01-12: qty 1

## 2013-01-12 MED ORDER — MAGNESIUM HYDROXIDE 400 MG/5ML PO SUSP: 30.0000 mL | Freq: Every day | ORAL | Status: DC | PRN

## 2013-01-12 MED ORDER — TRAZODONE HCL 50 MG PO TABS
50.0000 mg | ORAL_TABLET | Freq: Every day | ORAL | Status: DC
  Administered 2013-01-12 – 2013-01-14 (×3): 50 mg via ORAL
  Filled 2013-01-12 (×6): qty 1

## 2013-01-12 MED ORDER — ALUM & MAG HYDROXIDE-SIMETH 200-200-20 MG/5ML PO SUSP: 30.0000 mL | ORAL | Status: DC | PRN

## 2013-01-12 MED ORDER — NICOTINE 21 MG/24HR TD PT24: 21.0000 mg | MEDICATED_PATCH | Freq: Every day | TRANSDERMAL | Status: DC

## 2013-01-12 MED ORDER — ACETAMINOPHEN 325 MG PO TABS
650.0000 mg | ORAL_TABLET | Freq: Four times a day (QID) | ORAL | Status: DC | PRN
  Administered 2013-01-12 – 2013-01-21 (×13): 650 mg via ORAL
  Filled 2013-01-12 (×14): qty 2

## 2013-01-12 MED ORDER — ACETAMINOPHEN 500 MG PO TABS
1000.0000 mg | ORAL_TABLET | Freq: Once | ORAL | Status: AC
  Administered 2013-01-12: 1000 mg via ORAL
  Filled 2013-01-12: qty 2

## 2013-01-12 MED ORDER — ACETAMINOPHEN 325 MG PO TABS: 650.0000 mg | ORAL_TABLET | ORAL | Status: DC | PRN

## 2013-01-12 MED ORDER — ASPIRIN EC 81 MG PO TBEC
81.0000 mg | DELAYED_RELEASE_TABLET | Freq: Every day | ORAL | Status: DC
  Filled 2013-01-12: qty 1

## 2013-01-12 MED ORDER — FLUOXETINE HCL 20 MG PO CAPS
20.0000 mg | ORAL_CAPSULE | Freq: Every day | ORAL | Status: DC
  Administered 2013-01-12 – 2013-01-13 (×2): 20 mg via ORAL
  Filled 2013-01-12 (×5): qty 1

## 2013-01-12 MED ORDER — BACITRACIN-NEOMYCIN-POLYMYXIN OINTMENT TUBE
TOPICAL_OINTMENT | Freq: Two times a day (BID) | CUTANEOUS | Status: DC
  Administered 2013-01-13 – 2013-01-16 (×5): via TOPICAL
  Filled 2013-01-12: qty 15

## 2013-01-12 MED ORDER — ZOLPIDEM TARTRATE 5 MG PO TABS
5.0000 mg | ORAL_TABLET | Freq: Every evening | ORAL | Status: DC | PRN
  Administered 2013-01-12: 5 mg via ORAL
  Filled 2013-01-12: qty 1

## 2013-01-12 MED ORDER — LISINOPRIL 20 MG PO TABS
20.0000 mg | ORAL_TABLET | Freq: Every morning | ORAL | Status: DC
  Administered 2013-01-12: 20 mg via ORAL

## 2013-01-12 MED ORDER — LISINOPRIL 20 MG PO TABS
20.0000 mg | ORAL_TABLET | Freq: Every day | ORAL | Status: DC
  Administered 2013-01-12 – 2013-01-21 (×10): 20 mg via ORAL
  Filled 2013-01-12 (×12): qty 1

## 2013-01-12 MED ORDER — TAMSULOSIN HCL 0.4 MG PO CAPS: 0.4000 mg | ORAL_CAPSULE | Freq: Every day | ORAL | Status: DC

## 2013-01-12 NOTE — Progress Notes (Signed)
Patient ID: Mario [name], male   DOB: August 15, 1950, 62 y.o.   MRN: [id number]  Patient alert, oriented, calm and cooperative. C/o irritation and itching to his left lower leg. Patient has linear excoriation secondary to scratching. Mild erythema noted, normal temperature and non-tender to touch. No drainage present.  Plan: Will start Bacitracin/Neomycin/polymyxin B topically BID.   Reviewed the information documented and agree with the treatment plan.  [place] Haub,[name] 01/14/2013 8:32 AM

## 2013-01-12 NOTE — ED Notes (Signed)
Patients belongings have been moved to locker 27. 2 bags

## 2013-01-12 NOTE — Progress Notes (Signed)
Patient ID: Mario [name], male   DOB: September 28, 1950, 62 y.o.   MRN: [id number] Pt. Came in with SI thoughts. Pt. Stated he had a plan to take 30 Ambien. Pt. Stated he has been on a downward spiral for a few months. Lost job, evicted from home, divorce, all finances spent. Pt. Stated he has had 3 previous SI attempts with trying to suffocate self with plastic bag. All three attempts failed because pt. Got too hot when trying to tie the bag around his head. Pt. States that he has no one to confide in. Pt. Lives alone and tends to keep to self. Pt. Stated he has social anxiety and does not like being around a large group of people. Pt. Is calm and cooperative, pleasant. Pt. Admits to having some hopelessness and depression. Pt. Stated he has no reason to live, everything is going wrong.Denies SI/HI/AVH. Pt. Stated he has chronic shoulder and lower back pain. Pt. Stated he is unable to stand longer than 10 minutes at a time, the pain in his back get to excruciating and he has to sit down. Pt. Stated he has a back injury when younger, use to wrestle. Pt. Introduced to unit, given food and drink. Support and encouragement given. Explained procedure and policy. Q 15 min safety checks. No complaints or signs of distress at the moment.

## 2013-01-12 NOTE — [facility] Group Notes (Signed)
[facility] Group Notes:  (Clinical Social Work)  01/12/2013   3:00-4:00PM  Summary of Progress/Problems:   The main focus of today's process group was for the patient to identify ways in which they have sabotaged their own mental health wellness/recovery.  Motivational interviewing was used to explore the reasons they engage in this behavior, and reasons they may have for wanting to change.  The Stages of Change were explained to the group using a handout, and patients identified where they are with regard to changing self-defeating behaviors.  The patient expressed that he self-sabotages by isolating himself, which leads to procrastination of important things he needs to do.  The good thing that does for him is allow him to escape reality, but unfortunately it also leads to longer and longer periods of isolation and even paranoia about leaving the house.  He feels that it would be more responsible to not wait for things to just fall apart, and would preempt coming problems such as his money running out.    Type of Therapy:  Process Group  Participation Level:  Active  Participation Quality:  Attentive and Sharing  Affect:  Anxious and Depressed  Cognitive:  Oriented  Insight:  Developing/Improving  Engagement in Therapy:  Engaged  Modes of Intervention:  Education, Motivational Interviewing   Ambrose Mantle, LCSW 01/12/2013, 4:00pm

## 2013-01-12 NOTE — ED Notes (Addendum)
Patient has 2 belonging bags containing: Belongings are currently in triage at the nurses station. 1 black jacket 1 blue t-shirt Blue jeans underwear 1 pair of brown shoes 1 pair of black socks 1 black cell phone 1 black cell phone charger 1 brown wallet $15 cash Paramedic card
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
## 2013-01-12 NOTE — ED Provider Notes (Signed)
CSN: [id number]     Arrival date & time 01/11/13  2351 History   First MD Initiated Contact with Patient 01/11/13 2355     Chief Complaint  Patient presents with  . Medical Clearance   (Consider location/radiation/quality/duration/timing/severity/associated sxs/prior Treatment) HPI Mario [name] Is a 62 year old male who presents the emergency department chief complaint of suicidal ideation.  The patient was recently seen yesterday with complaint of chest pain.  The patient states that he has had worsening depression over the past 9 months.  He has had 2 recent suicide attempts.  During these attempts he placed a bag over his head but he became too hot and he took it off.  Patient used a cold towel to try to cool it down but was unable to go through with the process due to discomfort.  Patient also researched how many Ambien pills it would take to kill him self and states that 30 pills will not do it.  He has full intact in of committing suicide and has multiple attempts at home.  Patient denies any substance abuse or alcohol abuse.  The patient denies any history of hospitalization.  Patient states he's had a spiral of both medical and social losses including the death of his mother in the past few years.  Patient was evicted from his home this past Tuesday.  He was released from the hospital yesterday and and have decided that he no longer wanted to live and would be able to "do it anymore." Patient denies any medical complaints at this time.  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  He requests Tylenol for pain relief Past Medical History  Diagnosis Date  . Hypertension    Past Surgical History  Procedure Laterality Date  . Skin cancer excision    . Cryptorchidism     Family History  Problem Relation Age of Onset  . Dementia Father    History  Substance Use Topics  . Smoking status: Current Every Day Smoker  . Smokeless tobacco: Not on file  . Alcohol Use: No     Review of Systems Ten systems reviewed and are negative for acute change, except as noted in the HPI. \  Allergies  Demerol; Zocor; and Other  Home Medications   Current Outpatient Rx  Name  Route  Sig  Dispense  Refill  . aspirin EC 81 MG EC tablet   Oral   Take 1 tablet (81 mg total) by mouth daily.         [name] buPROPion (WELLBUTRIN XL) 150 MG 24 hr tablet   Oral   Take 1 tablet (150 mg total) by mouth daily.   30 tablet   0   . lisinopril (PRINIVIL,ZESTRIL) 20 MG tablet   Oral   Take 1 tablet (20 mg total) by mouth daily.   30 tablet   0   . tamsulosin (FLOMAX) 0.4 MG CAPS capsule   Oral   Take 0.4 mg by mouth.          There were no vitals taken for this visit. Physical Exam Physical Exam  Nursing note and vitals reviewed. Constitutional: He appears well-developed and well-nourished. No distress.  HENT:  Head: Normocephalic and atraumatic.  Eyes: Conjunctivae normal are normal. No scleral icterus.  Neck: Normal range of motion. Neck supple.  Cardiovascular: Normal rate, regular rhythm and normal heart sounds.   Pulmonary/Chest: Effort normal and breath sounds normal. No respiratory distress.  Abdominal: Soft. There  is no tenderness.  Musculoskeletal: He exhibits no edema.  Neurological: He is alert.  Skin: Skin is warm and dry. He is not diaphoretic.  Psychiatric: His behavior is normal.    ED Course  Procedures (including critical care time) Labs Review Labs Reviewed - No data to display Imaging Review Nm Myocar Multi W/spect W/wall Motion / Ef  01/11/2013   CLINICAL DATA:  Chest pain and hypertension.  EXAM: MYOCARDIAL IMAGING WITH SPECT (REST AND PHARMACOLOGIC-STRESS)  GATED LEFT VENTRICULAR WALL MOTION STUDY  LEFT VENTRICULAR EJECTION FRACTION  TECHNIQUE: Standard myocardial SPECT imaging was performed after resting intravenous injection of 10 mCi Tc-38m sestamibi. Subsequently, intravenous infusion of Lexiscan was performed under the  supervision of the Cardiology staff. At peak effect of the drug, 30 mCi Tc-88m sestamibi was injected intravenously and standard myocardial SPECT imaging was performed. Quantitative gated imaging was also performed to evaluate left ventricular wall motion, and estimate left ventricular ejection fraction.  COMPARISON:  Chest x-ray 01/09/2013  FINDINGS: A remote inferior wall infarct is present. There is a matching defect at rest and stress. Moderate hypokinesis is present along the inferior wall. Remainder of the heart demonstrates normal perfusion and contractility.  The calculated ejection fraction is 48% with no estimated diastolic volume of 157 mL and end estimated systolic volume of 82 mL.  IMPRESSION: 1. 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 2. No evidence for acute ischemia or reversible perfusion. 3. Otherwise normal contractility. 4. Low normal cardiac function with an estimated ejection fraction of 48%.   Electronically Signed   By: Gennette Pac M.D.   On: 01/11/2013 13:46    EKG Interpretation   None       MDM   1. Suicidal intent    12:17 AM BP 184/102  Pulse 94  Temp(Src) 98.2 F (36.8 C) (Oral)  Resp 18  SpO2 100% Patient hypertensive. He did not take his medication and has been unable to access his medications. Labs currently pending. Pateint will need admission for his depression and suicide attempt.   2:24 AM patient accepted for inpatient psychiatric treatment at [facility] by Dr. Magdalen Spatz. Patient appears medically clear for inpatient psych work up.    Arthor Captain, PA-C 01/12/13 1515

## 2013-01-12 NOTE — ED Notes (Signed)
Pt arrived to ED with a need for medical clearance.  Pt states that he is suicidal.  Pt states he lost his job nine months ago.  Pt states he has chronic bursitis of the shoulders and is tired of living in pain.  Pt states he has a prescription for Ambien but has only 30 pills and didn't believe that that was enough.  Pt has attempted to place a bag over his head but states it got too hot and had to take it off.  Pt then attempted to use a wet towel and a plastic bag over his head but it took too long.

## 2013-01-12 NOTE — Tx Team (Signed)
Initial Interdisciplinary Treatment Plan  PATIENT STRENGTHS: (choose at least two) Ability for insight Active sense of humor Average or above average intelligence Capable of independent living Communication skills  PATIENT STRESSORS: Financial difficulties Health problems   PROBLEM LIST: Problem List/Patient Goals Date to be addressed Date deferred Reason deferred Estimated date of resolution  Evicted from home      Fired from job      divorce      Health concerns                                     DISCHARGE CRITERIA:  Ability to meet basic life and health needs Adequate post-discharge living arrangements Improved stabilization in mood, thinking, and/or behavior Reduction of life-threatening or endangering symptoms to within safe limits  PRELIMINARY DISCHARGE PLAN: Attend aftercare/continuing care group Outpatient therapy Placement in alternative living arrangements  PATIENT/FAMIILY INVOLVEMENT: This treatment plan has been presented to and reviewed with the patient, Mario [name].  The patient and family have been given the opportunity to ask questions and make suggestions.  Heriberto Antigua M 01/12/2013, 4:31 AM
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
## 2013-01-12 NOTE — Progress Notes (Signed)
[facility] Group Notes:  (Nursing/MHT/Case Management/Adjunct)  Date:  01/12/2013  Time:  8:00 p.m.   Type of Therapy:  Psychoeducational Skills  Participation Level:  Minimal  Participation Quality:  Resistant  Affect:  Blunted  Cognitive:  Appropriate  Insight:  Lacking  Engagement in Group:  Lacking  Modes of Intervention:  Education  Summary of Progress/Problems: The patient described his day as having been "ok". He mentioned briefly that he felt "overwhelmed", but would not elaborate any further. He did verbalize that he spent some time meeting his peers. As a theme for the day, he shared that his coping skill is to "be by myself".   Hazle Coca S 01/12/2013, 10:11 PM

## 2013-01-13 DIAGNOSIS — F332 Major depressive disorder, recurrent severe without psychotic features: Secondary | ICD-10-CM | POA: Diagnosis present

## 2013-01-13 DIAGNOSIS — R45851 Suicidal ideations: Secondary | ICD-10-CM

## 2013-01-13 MED ORDER — FLUOXETINE HCL 20 MG PO CAPS
40.0000 mg | ORAL_CAPSULE | Freq: Every day | ORAL | Status: DC
  Administered 2013-01-14 – 2013-01-21 (×8): 40 mg via ORAL
  Filled 2013-01-13 (×10): qty 2

## 2013-01-13 MED ORDER — NICOTINE 14 MG/24HR TD PT24
MEDICATED_PATCH | TRANSDERMAL | Status: AC
  Administered 2013-01-13: 09:00:00
  Filled 2013-01-13: qty 1
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
## 2013-01-13 NOTE — Progress Notes (Signed)
Nutrition Brief Note  Patient identified on the Malnutrition Screening Tool (MST) Report  Wt Readings from Last 15 Encounters:  01/12/13 263 lb (119.296 kg)  01/10/13 266 lb 4.8 oz (120.793 kg)    Body mass index is 32.43 kg/(m^2). Patient meets criteria for Obesity, Class 1 based on current BMI.   Current diet order is Low sodium, patient is consuming approximately 100% of meals at this time. Labs and medications reviewed.   No nutrition interventions warranted at this time. If nutrition issues arise, please consult RD.   Linnell Fulling, RD, LDN Pager #: [phone] After-Hours Pager #: [phone]

## 2013-01-13 NOTE — Progress Notes (Signed)
Writer has observed patient sitting in the dayroom watching tv with minimal interaction with peers. Patient attended group this evening and shortly after group requested his trazadone. Writer inquired as to how his day had been and patient reported he was not sure if he should have came here. Writer spoke with patient 1:1 and he reports that he is currently homeless and has financial problems which led to his eviction. Patient reports that he has spent all his money from his [address]- K and is currently broke. Writer encouraged patient to speak with his social worker concerning his current living situation. Writer informed him of the groups offered during the day that would benefit him  and encouraged him to attend. Patient currenlty denies si/hi/a/v hallucinations. Safety maintained on unit with 15 min checks. Patient compliant with POC.

## 2013-01-13 NOTE — Progress Notes (Signed)
Psychoeducational Group Note  Date: 01/13/2013 Time:  1015  Group Topic/Focus:  Identifying Needs:   The focus of this group is to help patients identify their personal needs that have been historically problematic and identify healthy behaviors to address their needs.  Participation Level:  Active  Participation Quality:  Appropriate  Affect:  Flat  Cognitive:  Oriented  Insight:  Improving  Engagement in Group:  Engaged  Additional Comments:    Hatcher Froning A 

## 2013-01-13 NOTE — [facility] Group Notes (Signed)
[facility] Group Notes:  (Clinical Social Work)  01/13/2013   3-4PM  Summary of Progress/Problems:   The main focus of today's process group was to identify the patient's current support system and decide on other supports that can be put in place.  The picture on workbook was used to discuss why additional supports are needed, and there was an interactive discussion about unhealthy supports and healthy supports.  The patient expressed full comprehension of the concepts presented, and agreed that there is a need to add more supports.  He was fully engaged in the discussion about healthy versus unhealthy supports, has had experience with both, and showed insight.  Type of Therapy:  Process Group  Participation Level:  Active  Participation Quality:  Attentive and Sharing  Affect:  Blunted  Cognitive:  Appropriate and Oriented  Insight:  Engaged  Engagement in Therapy:  Engaged  Modes of Intervention:  Education,  Support and [employer], LCSW 01/13/2013, 4:00pm
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
## 2013-01-13 NOTE — Progress Notes (Addendum)
Mario [name] is seen OOB UAL on the 500 hall today...tolerated fair. HE remains dishsheveled, he takes his meds as scheduled and he is engaged in trying to understand and learn healthier coping skills. He completes his AM self inventory, he rates his depression and hopelessness "1/2" and writes that he cont to have " off and on" SI ( but states he can contract easily with this Clinical research associate for safety".    A HE is flat, sad and has a blunted  Affect. He does not talk much and  Is acutely depressed and quite worried about his DC status (where he will live when he is DC'[facility]) and he writes " I don't know" under section on self inventory that asks himspecifically how he feels about his DC.     R Safety is in place and poc moves forward Addendum: Pt's prozac is increased today to 40    Mg  Per Dr. Rexene Edison.

## 2013-01-13 NOTE — Progress Notes (Signed)
Psychoeducational Group Note  Date:  01/13/2013 Time:  1015  Group Topic/Focus:  Making Healthy Choices:   The focus of this group is to help patients identify negative/unhealthy choices they were using prior to admission and identify positive/healthier coping strategies to replace them upon discharge.  Participation Level:  Active  Participation Quality:  Appropriate  Affect:  Appropriate  Cognitive:  Oriented  Insight:  Partisipating  Engagement in Group:  Engaged  Additional Comments:  Dina Mobley A 01/13/2013  

## 2013-01-14 DIAGNOSIS — F329 Major depressive disorder, single episode, unspecified: Secondary | ICD-10-CM

## 2013-01-14 MED ORDER — CLONAZEPAM 0.5 MG PO TABS
0.5000 mg | ORAL_TABLET | Freq: Two times a day (BID) | ORAL | Status: DC
  Administered 2013-01-14 – 2013-01-17 (×6): 0.5 mg via ORAL
  Filled 2013-01-14 (×6): qty 1

## 2013-01-14 MED ORDER — NICOTINE 21 MG/24HR TD PT24
21.0000 mg | MEDICATED_PATCH | Freq: Every day | TRANSDERMAL | Status: DC
  Administered 2013-01-14 – 2013-01-21 (×8): 21 mg via TRANSDERMAL
  Filled 2013-01-14 (×9): qty 1

## 2013-01-14 NOTE — Tx Team (Signed)
Interdisciplinary Treatment Plan Update (Adult)  Date: 01/14/2013  Time Reviewed:  9:45 AM  Progress in Treatment: Attending groups: Yes Participating in groups:  Yes Taking medication as prescribed:  Yes Tolerating medication:  Yes Family/Significant othe contact made: CSW assessing  Patient understands diagnosis:  Yes Discussing patient identified problems/goals with staff:  Yes Medical problems stabilized or resolved:  Yes Denies suicidal/homicidal ideation: Yes Issues/concerns per patient self-inventory:  Yes Other:  New problem(s) identified: N/A  Discharge Plan or Barriers: CSW assessing for appropriate referrals.  Reason for Continuation of Hospitalization: Anxiety Depression Medication Stabilization  Comments: N/A  Estimated length of stay: 3-5 days  For review of initial/current patient goals, please see plan of care.  Attendees: Patient:    Family:     Physician:  Dr. Johnalagadda 01/14/2013 9:57 AM   Nursing:   Carol Davis, RN 01/14/2013 9:57 AM   Clinical Social Worker:  Kaija Kovacevic Horton, LCSW 01/14/2013 9:57 AM   Other: Neil Mashburn, PA 01/14/2013 9:57 AM   Other:  Maseta Dorley, MA care coordination 01/14/2013 9:57 AM   Other:  Quylle Hodnett, LCSW 01/14/2013 9:57 AM   Other:  Beverly Knight, RN 01/14/2013 9:58 AM   Other: Christa Dopson, RN 01/14/2013 9:58 AM   Other: Jennifer Clark, RN case management  01/14/2013 9:58 AM   Other:    Other:    Other:      Scribe for Treatment Team:   Horton, Josselyn Harkins Nicole, 01/14/2013 , 9:57 AM   

## 2013-01-14 NOTE — Progress Notes (Signed)
D: Patient in the dayroom on approach interacting with peers.  Patient states he had a good day but he did not sleep good the night before.  Patient states he is worried because he does not have a place to go when discharged.  Patient states he was evicted from his apartment and has no income at all.  Patient is passive SI but verbally contracts for safety. A: Staff to monitor Q 15 mins for safety.  Encouragement and support offered.  Scheduled medications administered per orders. R: Patient remains safe on the unit.  Patient attended group tonight.  Patient visible on the unit and interacting with peers.  Patient taking administered medications.

## 2013-01-14 NOTE — Progress Notes (Signed)
Recreation Therapy Notes  Date: 11.17.2014 Time: 3:00pm Location: [address] Dayroom  Group Topic: Gratitude  Goal Area(s) Addresses:  Patient will be able to identify things they are grateful for. Patient will be able to identify benefit of recognizing things they are grateful for.   Behavioral Response: Engaged, Appropriate  Intervention: Mandala   Activity: Patients were provided worksheet with "I am Grateful For" surrounded by categories - Happiness, Laughter; Work, Play, Rest; Knowledge, Education. Using these categories patients were asked identify 2-3 things they are grateful that fit into each category.   Education: Runner, broadcasting/film/video, Pharmacologist, Self-expression  Education Outcome: Acknowledges understanding.   Clinical Observations/Feedback: Patient actively engaged in activity, identifying things he is grateful for to correspond with each category. Patient contributed to group discussion sharing a story of a man he met in [place] who had a positive attitude because he made it a point to be grateful each day. Patient expressed understanding of why this works and how it can positively effect life.   Marykay Lex Mckenzi Buonomo, LRT/CTRS  Jearl Klinefelter 01/14/2013 3:49 PM

## 2013-01-14 NOTE — Progress Notes (Signed)
Adult Psychoeducational Group Note  Date:  01/14/2013 Time:  9:44 PM  Group Topic/Focus:  Wrap-Up Group:   The focus of this group is to help patients review their daily goal of treatment and discuss progress on daily workbooks.  Participation Level:  Active  Participation Quality:  Appropriate and Attentive  Affect:  Appropriate  Cognitive:  Alert and Appropriate  Insight: Appropriate and Good  Engagement in Group:  Engaged  Modes of Intervention:  Discussion  Additional Comments:   Pt attended group today and was appropriate and attentive during group. He expressed to the group that he was anxious about his car insurance, but other than that he had an "okay" day.   [place] Shi K 01/14/2013, 9:44 PM

## 2013-01-14 NOTE — [facility] Group Notes (Signed)
[facility] LCSW Group Therapy  01/14/2013  1:15 PM   Type of Therapy:  Group Therapy  Participation Level:  Active  Participation Quality:  Appropriate and Attentive  Affect:  Appropriate, Flat and Depressed  Cognitive:  Alert and Appropriate  Insight:  Developing/Improving and Engaged  Engagement in Therapy:  Developing/Improving and Engaged  Modes of Intervention:  Clarification, Confrontation, Discussion, Education, Exploration, Limit-setting, Orientation, Problem-solving, Rapport Building, Dance movement psychotherapist, Socialization and Support  Summary of Progress/Problems: Pt identified obstacles faced currently and processed barriers involved in overcoming these obstacles. Pt identified steps necessary for overcoming these obstacles and explored motivation (internal and external) for facing these difficulties head on. Pt further identified one area of concern in their lives and chose a goal to focus on for today.  Pt states that his biggest obstacle is running out of money and now being homeless. Pt states that he is was planning on doing "something stupid", referring to the suicidal thoughts and attempts but is now more open to living, but having no idea where to go from here.  Pt states that he can't stay at a homeless shelter because he doesn't like to be around people.  Pt states hopeless and helpless, stating he has no help or supports.  Pt actively participated and was engaged in group discussion.    Reyes Ivan, LCSW 01/14/2013 2:38 PM

## 2013-01-14 NOTE — Progress Notes (Signed)
[facility] MD Progress Note  01/14/2013 1:48 PM Mario [name]  MRN:  [id number] Subjective:  Patient complaining about insomnia and unable to sleep last night even after taking medication. Patient stated his chest is hurting. Patient stated he wanted to talk to the Mr. Hollice Espy who is a Education officer, environmental from his church regarding financial support to pay for his car insurance. Patient reportedly lives in his car. Patient reported his 401(k) has been running out in the ninth and his life is his normal resources. Patient requested to make a phone call to Mr. Hollice Espy and then requests his financial needs. Patient has been compliant with his medication without adverse effects.  Diagnosis:   DSM5: Schizophrenia Disorders:   Obsessive-Compulsive Disorders:   Trauma-Stressor Disorders:   Substance/Addictive Disorders:   Depressive Disorders:  Major Depressive Disorder - Severe (296.23)  Axis I: Major Depression, single episode  ADL's:  Intact  Sleep: Poor  Appetite:  Poor  Suicidal Ideation:  Patient endorses suicide ideation and her contract for safety Homicidal Ideation:  Denied AEB (as evidenced by):  Psychiatric Specialty Exam: ROS  Blood pressure 142/90, pulse 87, temperature 98.4 F (36.9 C), temperature source Oral, resp. rate 16, height 6' 3.5" (1.918 m), weight 119.296 kg (263 lb).Body mass index is 32.43 kg/(m^2).  General Appearance: Disheveled and Guarded  Patent attorney::  Fair  Speech:  Clear and Coherent  Volume:  Decreased  Mood:  Anxious, Depressed, Hopeless and Worthless  Affect:  Depressed and Flat  Thought Process:  Goal Directed and Intact  Orientation:  Full (Time, Place, and Person)  Thought Content:  Rumination  Suicidal Thoughts:  Yes.  without intent/plan  Homicidal Thoughts:  No  Memory:  Immediate;   Fair  Judgement:  Impaired  Insight:  Lacking  Psychomotor Activity:  Psychomotor Retardation  Concentration:  Fair  Recall:  Fair  Akathisia:  NA  Handed:  Right  AIMS  (if indicated):     Assets:  Communication Skills Desire for Improvement Physical Health Resilience  Sleep:  Number of Hours: 6.75   Current Medications: Current Facility-Administered Medications  Medication Dose Route Frequency Provider Last Rate Last Dose  . acetaminophen (TYLENOL) tablet 650 mg  650 mg Oral Q6H PRN Kristeen Mans, NP   650 mg at 01/13/13 2238  . alum & mag hydroxide-simeth (MAALOX/MYLANTA) 200-200-20 MG/5ML suspension 30 mL  30 mL Oral Q4H PRN Kristeen Mans, NP      . FLUoxetine (PROZAC) capsule 40 mg  40 mg Oral Daily Nehemiah Settle, MD   40 mg at 01/14/13 0831  . lisinopril (PRINIVIL,ZESTRIL) tablet 20 mg  20 mg Oral Daily Kristeen Mans, NP   20 mg at 01/14/13 0831  . magnesium hydroxide (MILK OF MAGNESIA) suspension 30 mL  30 mL Oral Daily PRN Kristeen Mans, NP      . neomycin-bacitracin-polymyxin (NEOSPORIN) ointment   Topical BID Kristeen Mans, NP      . nicotine (NICODERM CQ - dosed in mg/24 hours) patch 21 mg  21 mg Transdermal Daily Nehemiah Settle, MD   21 mg at 01/14/13 1304  . traZODone (DESYREL) tablet 50 mg  50 mg Oral QHS Nehemiah Settle, MD   50 mg at 01/13/13 2237    Lab Results: No results found for this or any previous visit (from the past 48 hour(s)).  Physical Findings: AIMS: Facial and Oral Movements Muscles of Facial Expression: None, normal Lips and Perioral Area: None, normal Jaw: None, normal Tongue: None,  normal,Extremity Movements Upper (arms, wrists, hands, fingers): None, normal Lower (legs, knees, ankles, toes): None, normal, Trunk Movements Neck, shoulders, hips: None, normal, Overall Severity Severity of abnormal movements (highest score from questions above): None, normal Incapacitation due to abnormal movements: None, normal Patient's awareness of abnormal movements (rate only patient's report): No Awareness, Dental Status Current problems with teeth and/or dentures?: No Does patient usually wear  dentures?: No  CIWA:  CIWA-Ar Total: 0 COWS:     Treatment Plan Summary: Daily contact with patient to assess and evaluate symptoms and progress in treatment Medication management  Plan: Treatment Plan/Recommendations:   1. Admit for crisis management and stabilization. 2. Medication management to reduce current symptoms to base line and improve the patient's overall level of functioning. Continue fluoxetine 40 mg daily for depression and trazodone 50 mg at bedtime for sleep. May add Klonopin 0.5 mg twice daily for anxiety. 3. Treat health problems as indicated. 4. Develop treatment plan to decrease risk of relapse upon discharge and to reduce the need for readmission. 5. Psycho-social education regarding relapse prevention and self care. 6. Health care follow up as needed for medical problems. 7. Restart home medications where appropriate. 8. Disposition plans are in progress   Medical Decision Making Problem Points:  Established problem, worsening (2), New problem, with no additional work-up planned (3), Review of last therapy session (1) and Review of psycho-social stressors (1) Data Points:  Review or order clinical lab tests (1) Review or order medicine tests (1) Review of medication regiment & side effects (2) Review of new medications or change in dosage (2)  I certify that inpatient services furnished can reasonably be expected to improve the patient's condition.   Lemya Greenwell,[name] 01/14/2013, 1:48 PM

## 2013-01-14 NOTE — Progress Notes (Signed)
[facility] Group Notes:  (Nursing/MHT/Case Management/Adjunct)  Date:  01/13/2013 Time:  8:00 p.m.  Type of Therapy:  Psychoeducational Skills  Participation Level:  Active  Participation Quality:  Attentive  Affect:  Appropriate  Cognitive:  Appropriate  Insight:  Improving  Engagement in Group:  Improving  Modes of Intervention:  Education  Summary of Progress/Problems: The patient was more spontaneous in group this evening. He shared with the group that he felt better as a result of playing a board game with his peers. In addition, he spent more time socializing with his peers in the dayroom.  The patient did however mentioned that he is concerned about being homeless and that he does not have a therapist. He was unable to identify who is social supports are for the theme of the day.    Atilano Covelli S 01/14/2013, 12:25 AM

## 2013-01-14 NOTE — [facility] Suicide Risk Assessment (Signed)
[facility] Adult Inpatient Family/Significant Other Suicide Prevention Education  Suicide Prevention Education:   Patient Refusal for Family/Significant Other Suicide Prevention Education: The patient has refused to provide written consent for family/significant other to be provided Family/Significant Other Suicide Prevention Education during admission and/or prior to discharge.  Physician notified.  CSW provided suicide prevention information with patient.    The suicide prevention education provided includes the following:  Suicide risk factors  Suicide prevention and interventions  National Suicide Hotline telephone number  [facility] assessment telephone number  [facility] Emergency Assistance 911  [facility] and/or Residential Mobile Crisis Unit telephone number   Reyes Ivan, [place] 01/14/2013 3:03 PM

## 2013-01-14 NOTE — Progress Notes (Signed)
Adult Psychoeducational Group Note  Date:  01/14/2013 Time:  11:00am Group Topic/Focus:  Wellness Toolbox:   The focus of this group is to discuss various aspects of wellness, balancing those aspects and exploring ways to increase the ability to experience wellness.  Patients will create a wellness toolbox for use upon discharge.  Participation Level:  Did Not Attend  Participation Quality:    Affect:    Cognitive:    Insight:   Engagement in Group:    Modes of Intervention:    Additional Comments:   Pt did not attend group.  Shelly Bombard D 01/14/2013, 1:19 PM

## 2013-01-14 NOTE — Progress Notes (Signed)
Report received from [facility]. Patient lying in bed asleep with eyes closed and respirations even. No distress noted. Safety maintained with 15 min checks.

## 2013-01-14 NOTE — [facility] Group Notes (Signed)
[facility] LCSW Aftercare Discharge Planning Group Note   01/14/2013  8:45 AM  Participation Quality:  Did Not Attend  Reyes Ivan, LCSW 01/14/2013 10:07 AM

## 2013-01-14 NOTE — Progress Notes (Signed)
Patient ID: Mario [name], male   DOB: Apr 01, 1950, 62 y.o.   MRN: [id number]  D: Patient presents with depressed affect and depressed mood. Patient denies HI and A/V hallucinations. Patient endorses passive SI but verbally contracts for safety. Patient states that he has pain in both of his shoulders and rates it at a 7/10 but refuses any intervention. Patient c/o of chest pain stated that it's because he was tired. BP was rechecked this evening and it was WDL. Patient states that he no longer has any chest pain.   A: Encouragement and support was offered to patient. Scheduled medications were given to patient per physician's orders.  R: Patient is pleasant with Clinical research associate and is seen periodically in the milieu interacting with other patients. Q15 minute safety checks are maintained and patient is safe at this time.

## 2013-01-14 NOTE — ED Provider Notes (Signed)
Medical screening examination/treatment/procedure(s) were conducted as a shared visit with non-physician practitioner(s) and myself.  I personally evaluated the patient during the encounter.  EKG Interpretation   None       TTS to evaluate. Medically clear  Lyanne Co, MD 01/14/13 1501

## 2013-01-15 DIAGNOSIS — F322 Major depressive disorder, single episode, severe without psychotic features: Secondary | ICD-10-CM

## 2013-01-15 MED ORDER — TRAZODONE HCL 100 MG PO TABS
100.0000 mg | ORAL_TABLET | Freq: Every day | ORAL | Status: DC
  Filled 2013-01-15: qty 1

## 2013-01-15 MED ORDER — TRIAMCINOLONE 0.1 % CREAM:EUCERIN CREAM 1:1
TOPICAL_CREAM | Freq: Three times a day (TID) | CUTANEOUS | Status: DC
  Administered 2013-01-16 – 2013-01-20 (×7): via TOPICAL
  Filled 2013-01-15 (×2): qty 1

## 2013-01-15 MED ORDER — QUETIAPINE FUMARATE 50 MG PO TABS
50.0000 mg | ORAL_TABLET | Freq: Every day | ORAL | Status: DC
  Administered 2013-01-15: 50 mg via ORAL
  Filled 2013-01-15 (×4): qty 1

## 2013-01-15 NOTE — Progress Notes (Signed)
Patient ID: Mario [name], male   DOB: [birth year], 62 y.o.   MRN: [id number] [facility] MD Progress Note  01/15/2013 4:37 PM [name]  MRN:  [id number] Subjective:  Met with the patient 1:1 to discuss his progress. He reports feeling depressed and suicidal. He states he has a plan but "not here." He does contract for safety. He says he has trouble both going to sleep and staying asleep and that the trazodone is not helping even after it was increased.    He is attending groups and says they help, but he is still unclear on what his discharge plans are.     He also complains that his legs are not getting any better. Diagnosis:   DSM5: Schizophrenia Disorders:   Obsessive-Compulsive Disorders:   Trauma-Stressor Disorders:   Substance/Addictive Disorders:   Depressive Disorders:  Major Depressive Disorder - Severe (296.23)  Axis I: Major Depression, single episode  ADL's:  Intact  Sleep: Poor  Appetite:  Poor  Suicidal Ideation:  Patient endorses suicide ideation and her contract for safety Homicidal Ideation:  Denied AEB (as evidenced by):  Psychiatric Specialty Exam: ROS  Blood pressure 124/90, pulse 70, temperature 98.5 F (36.9 C), temperature source Oral, resp. rate 20, height 6' 3.5" (1.918 m), weight 119.296 kg (263 lb).Body mass index is 32.43 kg/(m^2).  General Appearance: Disheveled and Guarded  Patent attorney::  Fair  Speech:  Clear and Coherent  Volume:  Decreased  Mood:  Anxious, Depressed, Hopeless and Worthless  Affect:  Depressed and Flat  Thought Process:  Goal Directed and Intact  Orientation:  Full (Time, Place, and Person)  Thought Content:  Rumination  Suicidal Thoughts:  Yes.  without intent/plan  Homicidal Thoughts:  No  Memory:  Immediate;   Fair  Judgement:  Impaired  Insight:  Lacking  Psychomotor Activity:  Psychomotor Retardation  Concentration:  Fair  Recall:  Fair  Akathisia:  NA  Handed:  Right  AIMS (if indicated):     Assets:   Communication Skills Desire for Improvement Physical Health Resilience  Sleep:  Number of Hours: 6.75   Current Medications: Current Facility-Administered Medications  Medication Dose Route Frequency Provider Last Rate Last Dose  . acetaminophen (TYLENOL) tablet 650 mg  650 mg Oral Q6H PRN Kristeen Mans, NP   650 mg at 01/15/13 1205  . alum & mag hydroxide-simeth (MAALOX/MYLANTA) 200-200-20 MG/5ML suspension 30 mL  30 mL Oral Q4H PRN Kristeen Mans, NP      . clonazePAM Scarlette Calico) tablet 0.5 mg  0.5 mg Oral BID Nehemiah Settle, MD   0.5 mg at 01/15/13 0817  . FLUoxetine (PROZAC) capsule 40 mg  40 mg Oral Daily Nehemiah Settle, MD   40 mg at 01/15/13 0817  . lisinopril (PRINIVIL,ZESTRIL) tablet 20 mg  20 mg Oral Daily Kristeen Mans, NP   20 mg at 01/15/13 0817  . magnesium hydroxide (MILK OF MAGNESIA) suspension 30 mL  30 mL Oral Daily PRN Kristeen Mans, NP      . neomycin-bacitracin-polymyxin (NEOSPORIN) ointment   Topical BID Kristeen Mans, NP      . nicotine (NICODERM CQ - dosed in mg/24 hours) patch 21 mg  21 mg Transdermal Daily Nehemiah Settle, MD   21 mg at 01/15/13 0815  . traZODone (DESYREL) tablet 100 mg  100 mg Oral QHS Nehemiah Settle, MD        Lab Results: No results found for this or any previous visit (from the  past 48 hour(s)).  Physical Findings: patient has bilateral PVD of both lower extremities as well as excoriations from his dry skin.  He states it is worse after he showers but denies taking hot showers. AIMS: Facial and Oral Movements Muscles of Facial Expression: None, normal Lips and Perioral Area: None, normal Jaw: None, normal Tongue: None, normal,Extremity Movements Upper (arms, wrists, hands, fingers): None, normal Lower (legs, knees, ankles, toes): None, normal, Trunk Movements Neck, shoulders, hips: None, normal, Overall Severity Severity of abnormal movements (highest score from questions above): None,  normal Incapacitation due to abnormal movements: None, normal Patient's awareness of abnormal movements (rate only patient's report): No Awareness, Dental Status Current problems with teeth and/or dentures?: No Does patient usually wear dentures?: No  CIWA:  CIWA-Ar Total: 0 COWS:     Treatment Plan Summary: Daily contact with patient to assess and evaluate symptoms and progress in treatment Medication management  Plan: Treatment Plan/Recommendations:  1. D/C trazodone and use Seroquel for sleep. 2. Continue Prozac as written. 3. Will order Eucerin cream for legs and Aveeno soap. 4. ELOS: continue to monitor. Disposition in progress.  Medical Decision Making Problem Points:  Established problem, worsening (2), New problem, with no additional work-up planned (3), Review of last therapy session (1) and Review of psycho-social stressors (1) Data Points:  Review or order clinical lab tests (1) Review or order medicine tests (1) Review of medication regiment & side effects (2) Review of new medications or change in dosage (2)  I certify that inpatient services furnished can reasonably be expected to improve the patient's condition.  Rona Ravens. Mashburn RPAC 4:43 PM 01/15/2013  Reviewed the information documented and agree with the treatment plan.  Raymar Joiner,[name] 01/16/2013 4:16 PM

## 2013-01-15 NOTE — Progress Notes (Signed)
Recreation Therapy Notes  Date: 11.18.2014 Time: 2:45pm Location: [address] Dayroom   Group Topic: Animal Assisted Activities (AAA)  Behavioral Response: Engaged, Appropriate   Affect: Euthymic  Clinical Observations/Feedback: Dog Team: [place] & handler. Patient interacted appropriately with peer, dog team, and LRT.   Kaytie Ratcliffe L Aniket Paye, LRT/CTRS  [place] Howington L 01/15/2013 5:10 PM 

## 2013-01-15 NOTE — Progress Notes (Signed)
The focus of this group is to educate the patient on the purpose and policies of crisis stabilization and provide a format to answer questions about their admission.  The group details unit policies and expectations of patients while admitted.  Patient participated in group and did share humor when asked to.

## 2013-01-15 NOTE — Progress Notes (Signed)
[facility] Group Notes:  (Nursing/MHT/Case Management/Adjunct)  Date:  01/15/2013  Time:  8:00p.m.  Type of Therapy:  Psychoeducational Skills  Participation Level:  Active  Participation Quality:  Attentive  Affect:  Appropriate  Cognitive:  Appropriate  Insight:  Improving  Engagement in Group:  Improving  Modes of Intervention:  Education  Summary of Progress/Problems: The patient verbalized in group that he had a good day overall. For one, he kept himself awake throughout the day. Next, he learned a great deal from one of the group speaker's. Finally, he used some of his personal humor to socialize with his peers. The patient was unable to come up with a recovery technique for the theme of the day.   Leoni Goodness S 01/15/2013, 10:00 PM

## 2013-01-15 NOTE — [facility] Group Notes (Signed)
[facility] LCSW Group Therapy  01/15/2013  1:15 PM   Type of Therapy:  Group Therapy  Participation Level:  Active  Participation Quality:  Appropriate and Attentive  Affect:  Appropriate  Cognitive:  Alert and Appropriate  Insight:  Developing/Improving and Engaged  Engagement in Therapy:  Developing/Improving and Engaged  Modes of Intervention:  Activity, Clarification, Confrontation, Discussion, Education, Exploration, Limit-setting, Orientation, Problem-solving, Rapport Building, Reality Testing, Socialization and Support  Summary of Progress/Problems: Patient was attentive and engaged with speaker from [employer].  Patient was attentive to speaker while they shared their story of dealing with mental health and overcoming it.  Patient expressed interest in their programs and services and received information on their agency.  Patient processed ways they can relate to the speaker.     Reiley Bertagnolli Horton, LCSW 01/15/2013 1:26 PM   

## 2013-01-15 NOTE — Progress Notes (Signed)
D: Patient in he dayroom on approach.  Patient states he had a good day.  Patient states he did not sleep at all last night.  Patient states he is less stressed out because he found out his car insurance was paid.  Patient states he has passive SI but verbally contracts for safety.  Patient denies HI and denies AVH.   A: Staff to monitor Q 15 mins for safety.  Encouragement and support offered.  Scheduled medications administered per orders. R: Patient remains safe on the unit.  Patient attended group tonight.  Patient visible on the unit and interacting with peers.  Patient taking administered medications.

## 2013-01-15 NOTE — Progress Notes (Signed)
Patient ID: Mario [name], male   DOB: 1950/06/30, 62 y.o.   MRN: [id number] D-Patient reports poor sleep last night for the second night in a row.  His appetite is good and his energy level is low.  His ability to pay attention is poor and he is rating his depression at 7/10.  Patient talked about losing his job and not understanding how HR could interfere in his employment when he had been given no verbal warnings.A-  Encouraged patient to start making phone calls to pursue this while he is here as he says he plans to. R- Patient had several reasons why he has not started to pursue post discharge plans.  He continues to feel suicidal and contracts for safety.  Patient's legs continue to be reddened and itchy in shin and L ankle area.  He has been scratching them and has several scratch marks on them.  He is using ordered ointment to decrease itching.

## 2013-01-16 MED ORDER — QUETIAPINE FUMARATE 25 MG PO TABS
25.0000 mg | ORAL_TABLET | Freq: Every evening | ORAL | Status: DC | PRN
  Administered 2013-01-16 – 2013-01-18 (×3): 25 mg via ORAL
  Filled 2013-01-16 (×8): qty 1

## 2013-01-16 NOTE — Tx Team (Signed)
Interdisciplinary Treatment Plan Update (Adult)  Date: 01/16/2013  Time Reviewed:  9:45 AM  Progress in Treatment: Attending groups: Yes Participating in groups:  Yes Taking medication as prescribed:  Yes Tolerating medication:  Yes Family/Significant othe contact made: No, pt refused Patient understands diagnosis:  Yes Discussing patient identified problems/goals with staff:  Yes Medical problems stabilized or resolved:  Yes Denies suicidal/homicidal ideation: No, pt endorses SI Issues/concerns per patient self-inventory:  Yes Other:  New problem(s) identified: N/A  Discharge Plan or Barriers: CSW assessing for appropriate referrals.  Pt is homeless and not open to staying in a homeless shelter.  Pt wants to go to [facility].  CSW working with pt on a d/c plan.    Reason for Continuation of Hospitalization: Anxiety Depression Medication Stabilization  Comments: N/A  Estimated length of stay: 2-3 days  For review of initial/current patient goals, please see plan of care.  Attendees: Patient:    Family:     Physician:  Dr. Javier Glazier 01/16/2013 10:09 AM   Nursing:   Burnetta Sabin, RN 01/16/2013 10:09 AM   Clinical Social Worker:  Reyes Ivan, LCSW 01/16/2013 10:09 AM   Other: Verne Spurr, PA 01/16/2013 10:09 AM   Other:  Frankey Shown, MA care coordination 01/16/2013 10:09 AM   Other:  Juline Patch, LCSW 01/16/2013 10:09 AM   Other:  Lowella Grip, RN 01/16/2013 10:10 AM   Other: Marzetta Board, RN 01/16/2013 10:10 AM   Other: Liliane Bade, care coordination 01/16/2013 10:10 AM   Other:    Other:    Other:      Scribe for Treatment Team:   Carmina Miller, 01/16/2013 , 10:09 AM

## 2013-01-16 NOTE — Progress Notes (Signed)
D: Patient in the dayroom interacting with peers on approach.  Patient states he was having a good day until he talked to the doctor.  Patient states he is not ready for discharge and states he does not want to be kicked out on the street.  Patient states he is passive SI but verbally contracts for safety.  Patient states if he is discharged he is going to go out there and do something stupid.  Patient denies HI and denies AVH. A: Staff to monitor Q 15 mins for safety.  Encouragement and support offered.  Scheduled medications administered per orders. R: Patient remains safe on the unit.  Patient attended group tonight.  Patient visible on the unit.  Patient taking administered medications.

## 2013-01-16 NOTE — Progress Notes (Signed)
Adult Psychoeducational Group Note  Date:  01/16/2013 Time: 10:30am Group Topic/Focus:  Personal Choices and Values:   The focus of this group is to help patients assess and explore the importance of values in their lives, how their values affect their decisions, how they express their values and what opposes their expression.  Participation Level:  Active  Participation Quality:  Appropriate and Attentive  Affect:  Appropriate  Cognitive:  Alert and Appropriate  Insight: Appropriate  Engagement in Group:  Engaged  Modes of Intervention:  Discussion and Education  Additional Comments:  Pt attended and participated in group. Discussion was on personal development. The question was asked what does personal development mean to you? Pt stated personal development means not to dwell on the past and focus on the future.  Shelly Bombard D 01/16/2013, 4:22 PM

## 2013-01-16 NOTE — [facility] Group Notes (Signed)
[facility] LCSW Aftercare Discharge Planning Group Note   01/16/2013 8:45 AM  Participation Quality:  Alert and Appropriate   Mood/Affect:  Appropriate, Flat and Depressed  Depression Rating:  8  Anxiety Rating:  8  Thoughts of Suicide:  Pt endorses SI with a plan to jump off a bridge or cut with a razor, denies HI  Will you contract for safety?   Yes  Current AVH:  Pt denies  Plan for Discharge/Comments:  Pt attended discharge planning group and actively participated in group.  CSW provided pt with today's workbook.  Pt states that he is not doing well today and is tired.  Pt states that he doesn't know what he will do when he leaves here.  CSW worked with pt in depth after group about homeless resources and options.  Pt states that he can't stay at a homeless shelter, not open to relocating to a different city or staying in any shelter.  CSW discussed [employer] and pt states he wants to apply for disability and stay in this area for the [place].  Pt is very interested in getting into [facility].  CSW explained that it is hard to get into there but CSW has left a voicemail message for admissions at [facility] and will continue to call to make this referral.  CSW will continue to assess for appropriate referrals.  No further needs voiced by pt at this time.      Transportation Means: Pt reports access to transportation  Supports: No supports mentioned at this time  Reyes Ivan, LCSW 01/16/2013 9:58 AM

## 2013-01-16 NOTE — Progress Notes (Signed)
Patient ID: Mario [name], male   DOB: 1950/09/04, 62 y.o.   MRN: [id number] [facility] MD Progress Note  01/16/2013 4:04 PM Mario [name]  MRN:  [id number]  Subjective: Patient has been depressed, anxious and continued to be suicidal . Patient stated his left well after 48 hour was with the medication and also found himself snoring. Patient stated that his car insurance premium was paid with the help of pastor from his church. Patient is concerned about his psychosocial situation, placement in the community and financial support. Patient reported he wants to go to the social service department and also [facility] beneficial department and one pustule placement at the [facility]. Patient stated he would like to get unemployment benefits and the possible want to appeal if denied. He is attending groups and says they help, but he is still unclear on what his discharge plans are. Patient complained feeling drowsy and sleepy during the daytime so we will stop his clonazepam in daytime.  Diagnosis:   DSM5: Schizophrenia Disorders:   Obsessive-Compulsive Disorders:   Trauma-Stressor Disorders:   Substance/Addictive Disorders:   Depressive Disorders:  Major Depressive Disorder - Severe (296.23)  Axis I: Major Depression, single episode  ADL's:  Intact  Sleep: Poor  Appetite:  Poor  Suicidal Ideation:  Patient endorses suicide ideation and her contract for safety Homicidal Ideation:  Denied AEB (as evidenced by):  Psychiatric Specialty Exam: ROS  Blood pressure 150/95, pulse 71, temperature 97.8 F (36.6 C), temperature source Oral, resp. rate 18, height 6' 3.5" (1.918 m), weight 119.296 kg (263 lb).Body mass index is 32.43 kg/(m^2).  General Appearance: Disheveled and Guarded  Patent attorney::  Fair  Speech:  Clear and Coherent  Volume:  Decreased  Mood:  Anxious, Depressed, Hopeless and Worthless  Affect:  Depressed and Flat  Thought Process:  Goal Directed and Intact  Orientation:   Full (Time, Place, and Person)  Thought Content:  Rumination  Suicidal Thoughts:  Yes.  without intent/plan  Homicidal Thoughts:  No  Memory:  Immediate;   Fair  Judgement:  Impaired  Insight:  Lacking  Psychomotor Activity:  Psychomotor Retardation  Concentration:  Fair  Recall:  Fair  Akathisia:  NA  Handed:  Right  AIMS (if indicated):     Assets:  Communication Skills Desire for Improvement Physical Health Resilience  Sleep:  Number of Hours: 6.75   Current Medications: Current Facility-Administered Medications  Medication Dose Route Frequency Provider Last Rate Last Dose  . acetaminophen (TYLENOL) tablet 650 mg  650 mg Oral Q6H PRN Kristeen Mans, NP   650 mg at 01/15/13 1205  . alum & mag hydroxide-simeth (MAALOX/MYLANTA) 200-200-20 MG/5ML suspension 30 mL  30 mL Oral Q4H PRN Kristeen Mans, NP      . clonazePAM Scarlette Calico) tablet 0.5 mg  0.5 mg Oral BID Nehemiah Settle, MD   0.5 mg at 01/15/13 0817  . FLUoxetine (PROZAC) capsule 40 mg  40 mg Oral Daily Nehemiah Settle, MD   40 mg at 01/15/13 0817  . lisinopril (PRINIVIL,ZESTRIL) tablet 20 mg  20 mg Oral Daily Kristeen Mans, NP   20 mg at 01/15/13 0817  . magnesium hydroxide (MILK OF MAGNESIA) suspension 30 mL  30 mL Oral Daily PRN Kristeen Mans, NP      . neomycin-bacitracin-polymyxin (NEOSPORIN) ointment   Topical BID Kristeen Mans, NP      . nicotine (NICODERM CQ - dosed in mg/24 hours) patch 21 mg  21 mg Transdermal Daily  Nehemiah Settle, MD   21 mg at 01/15/13 0815  . traZODone (DESYREL) tablet 100 mg  100 mg Oral QHS Nehemiah Settle, MD        Lab Results: No results found for this or any previous visit (from the past 48 hour(s)).  Physical Findings: patient has bilateral PVD of both lower extremities as well as excoriations from his dry skin.  He states it is worse after he showers but denies taking hot showers. AIMS: Facial and Oral Movements Muscles of Facial Expression: None,  normal Lips and Perioral Area: None, normal Jaw: None, normal Tongue: None, normal,Extremity Movements Upper (arms, wrists, hands, fingers): None, normal Lower (legs, knees, ankles, toes): None, normal, Trunk Movements Neck, shoulders, hips: None, normal, Overall Severity Severity of abnormal movements (highest score from questions above): None, normal Incapacitation due to abnormal movements: None, normal Patient's awareness of abnormal movements (rate only patient's report): No Awareness, Dental Status Current problems with teeth and/or dentures?: No Does patient usually wear dentures?: No  CIWA:  CIWA-Ar Total: 0 COWS:     Treatment Plan Summary: Daily contact with patient to assess and evaluate symptoms and progress in treatment Medication management  Plan: Treatment Plan/Recommendations:  1. continue Seroquel 50 mg and clonazepam 0.5 mg at bedtime for sleep. 2. Continue Prozac 40 mg daily for depression. 3. Will order Eucerin cream for legs and Aveeno soap. 4. ELOS: continue to monitor. Disposition in progress.  Medical Decision Making Problem Points:  Established problem, worsening (2), New problem, with no additional work-up planned (3), Review of last therapy session (1) and Review of psycho-social stressors (1) Data Points:  Review or order clinical lab tests (1) Review or order medicine tests (1) Review of medication regiment & side effects (2) Review of new medications or change in dosage (2)  I certify that inpatient services furnished can reasonably be expected to improve the patient's condition.   Aureliano Oshields,[name] 01/16/2013 4:09 PM

## 2013-01-16 NOTE — Progress Notes (Signed)
Recreation Therapy Notes  Date: 11.19.2014 Time: 2:50pm Location: [address] Dayroom   Group Topic: Communication  Goal Area(s) Addresses:  Patient will effectively communicate with peers in group.   Patient will verbalize benefit of healthy communication. Patient will verbalize positive effect of healthy communication on post d/c goals.  Patient will identify communication techniques that made activity effective for group.   Behavioral Response: Engaged, Attentive, Appropriate   Intervention: Game  Activity: Random Words. Game was played in three rounds. Patients were asked to select words from provided container, round one patients were allowed to describe the selected words using 5 words. Round two patient was given four words. The last round patients were only allowed to use non-verbal means to get group members to guess selected words.     Education: Communication, Discharge Planning  Education Outcome: Acknowledges understanding  Clinical Observations/Feedback: Patient presented with flat, withdrawn affect. Patient participated in activity, but did so without any excitement or enthusiasm. Patient made no contributions to group discussion, but appeared to actively listen as he maintained appropriate eye contact with speaker.   Patient asked to speak with LRT 1:1 following group session. Patient expressed that he is not ready to leave, which is expected in the next few days. Patient expressed passive SI stating that he knows of ways he could kill himself, as well as stating that he has a knife in his car which he could use to end his life. Patient stated he did not think he would be able to go through with it, but expressed it was possible. Patient additionally stated he has no where to go following discharge and that he has no support system. LRT explained that his LCSW can arrange some place for him to go following d/c. Patient stated he was not open to going to [employer] or [employer]. LRT encouraged patient to speak with LCSW in order to see what other options are available. After some debate patient stated if he had someplace to go following d/c he would feel more comfortable. LRT again encouraged patient to speak with LCSW about his options following d/c, explaining that she would be the person to make arrangements for his d/c. Patient stated he open to speaking with LCSW. LRT stated she would let patient know he was interested in talking to LCSW.   Patinet additionally described in great detail previous suicide attempts. Both by means of wrapping a plastic bag around his head, the second time with a wet towel additionally wrapped around his neck. Patient stated he abandoned the second try because he was afraid he would fail at suicide and become brain dead.   Marykay Lex Roark Rufo, LRT/CTRS  Padraig Nhan L 01/16/2013 4:18 PM

## 2013-01-16 NOTE — [facility] Group Notes (Signed)
[facility] LCSW Group Therapy  01/16/2013  1:15 PM   Type of Therapy:  Group Therapy  Participation Level:  Did Not Attend   Keionna Kinnaird Horton, LCSW 01/16/2013 2:25 PM   

## 2013-01-16 NOTE — Progress Notes (Signed)
Adult Psychoeducational Group Note  Date:  01/16/2013 Time:  9:29 PM  Group Topic/Focus:  Wrap-Up Group:   The focus of this group is to help patients review their daily goal of treatment and discuss progress on daily workbooks.  Participation Level:  Active  Participation Quality:  Appropriate  Affect:  Appropriate  Cognitive:  Alert  Insight: Appropriate  Engagement in Group:  Engaged  Modes of Intervention:  Discussion  Additional Comments:  Pt stated that he day did start off crappy because of the adjustments that were made to his medications. He felt that he really did not wake up until about 3:00 this evening with meds. Pt is very bright and enjoys telling jokes.  Kaleen Odea R 01/16/2013, 9:29 PM

## 2013-01-16 NOTE — Progress Notes (Signed)
Patient ID: Mario [name], male   DOB: 06/29/1950, 62 y.o.   MRN: [id number] D: Patient presents with appropriate affect and depressed mood. Patient endorses SI but verbally contracts for safety. Patient denies HI and A/V hallucinations. Patient rates depression and hopelessness at 8/10 for the day.  Patient complains of pain but refuses any intervention. Patient complains of sleeping medication making him groggy during the day. Writer spoke with patient and encouraged patient to report these symptoms to physician.  A: Writer gave patient scheduled medications as prescribed by physician. Writer offered patient support and encouragement.   R: Patient is cooperative and receptive with Clinical research associate but remains to be negative about his treatment. Patient is seen in the milieu and is going to groups periodically. Q15 minute safety checks are maintained. Patient is safe at this time.

## 2013-01-17 NOTE — [facility] Group Notes (Signed)
[facility] LCSW Group Therapy  01/17/2013  1:15 PM   Type of Therapy:  Group Therapy  Participation Level:  Active  Participation Quality:  Attentive and Supportive  Affect:  Depressed and Flat  Cognitive:  Alert and Oriented  Insight:  Developing/Improving, Engaged and Supportive  Engagement in Therapy:  Developing/Improving, Engaged and Supportive  Modes of Intervention:  Activity, Clarification, Confrontation, Discussion, Education, Exploration, Limit-setting, Orientation, Problem-solving, Rapport Building, Dance movement psychotherapist, Socialization and Support  Summary of Progress/Problems: Patient was attentive and engaged with speaker from [employer].  Patient was attentive to speaker while they shared their story of dealing with mental health and overcoming it.  Patient expressed interest in their programs and services and received information on their agency.  Patient processed ways they can relate to the speaker.   Pt came into group about halfway through.    Undrea Shipes Horton, LCSW 01/17/2013 1:25 PM

## 2013-01-17 NOTE — Progress Notes (Signed)
Adult Psychoeducational Group Note  Date:  01/17/2013 Time:  11:00AM Group Topic/Focus:  Leisure and Lifestyle Changes  Participation Level:  Did Not Attend   Additional Comments:  Pt. Didn't attend group.   Bing Plume D 01/17/2013, 12:00 PM

## 2013-01-17 NOTE — Progress Notes (Signed)
Patient ID: Mario [name], male   DOB: 12/11/1950, 62 y.o.   MRN: [id number] [facility] MD Progress Note  01/17/2013 12:14 PM Wrigley Plasencia  MRN:  [id number]  Subjective: Patient stated that not feeling good and being sleepy during this morning. Patient stated that thinking about discharged from the facility making him feel scared and panicky. Patient  has been less socializing and has increased psychomotor retardation. Patient was encouraged to make phone calls regarding aftercare/disposition plans including placement in the community and financial support.  patient stated that he did not make an effort to make phone calls yet and has a plan of working with case Production designer, theatre/television/film.   Patient reported he wants to go to the social service department and also [facility] beneficial department and placement at the [facility]. Patient stated he would like to get unemployment benefits and the possible want to appeal if denied. He is attending groups and says they help, but he is still unclear on what his discharge plans are.   Diagnosis:   DSM5: Schizophrenia Disorders:   Obsessive-Compulsive Disorders:   Trauma-Stressor Disorders:   Substance/Addictive Disorders:   Depressive Disorders:  Major Depressive Disorder - Severe (296.23)  Axis I: Major Depression, single episode  ADL's:  Intact  Sleep: Poor  Appetite:  Poor  Suicidal Ideation:  Patient endorses suicide ideation and her contract for safety Homicidal Ideation:  Denied AEB (as evidenced by):  Psychiatric Specialty Exam: ROS  Blood pressure 145/89, pulse 88, temperature 97.4 F (36.3 C), temperature source Oral, resp. rate 20, height 6' 3.5" (1.918 m), weight 119.296 kg (263 lb).Body mass index is 32.43 kg/(m^2).  General Appearance: Disheveled and Guarded  Patent attorney::  Fair  Speech:  Clear and Coherent  Volume:  Decreased  Mood:  Anxious, Depressed, Hopeless and Worthless  Affect:  Depressed and Flat  Thought Process:  Goal Directed  and Intact  Orientation:  Full (Time, Place, and Person)  Thought Content:  Rumination  Suicidal Thoughts:  Yes.  without intent/plan  Homicidal Thoughts:  No  Memory:  Immediate;   Fair  Judgement:  Impaired  Insight:  Lacking  Psychomotor Activity:  Psychomotor Retardation  Concentration:  Fair  Recall:  Fair  Akathisia:  NA  Handed:  Right  AIMS (if indicated):     Assets:  Communication Skills Desire for Improvement Physical Health Resilience  Sleep:  Number of Hours: 5.75   Current Medications: Current Facility-Administered Medications  Medication Dose Route Frequency Provider Last Rate Last Dose  . acetaminophen (TYLENOL) tablet 650 mg  650 mg Oral Q6H PRN Kristeen Mans, NP   650 mg at 01/15/13 1205  . alum & mag hydroxide-simeth (MAALOX/MYLANTA) 200-200-20 MG/5ML suspension 30 mL  30 mL Oral Q4H PRN Kristeen Mans, NP      . clonazePAM Scarlette Calico) tablet 0.5 mg  0.5 mg Oral BID Nehemiah Settle, MD   0.5 mg at 01/15/13 0817  . FLUoxetine (PROZAC) capsule 40 mg  40 mg Oral Daily Nehemiah Settle, MD   40 mg at 01/15/13 0817  . lisinopril (PRINIVIL,ZESTRIL) tablet 20 mg  20 mg Oral Daily Kristeen Mans, NP   20 mg at 01/15/13 0817  . magnesium hydroxide (MILK OF MAGNESIA) suspension 30 mL  30 mL Oral Daily PRN Kristeen Mans, NP      . neomycin-bacitracin-polymyxin (NEOSPORIN) ointment   Topical BID Kristeen Mans, NP      . nicotine (NICODERM CQ - dosed in mg/24 hours) patch 21 mg  21 mg Transdermal Daily Nehemiah Settle, MD   21 mg at 01/15/13 0815  . traZODone (DESYREL) tablet 100 mg  100 mg Oral QHS Nehemiah Settle, MD        Lab Results: No results found for this or any previous visit (from the past 48 hour(s)).  Physical Findings: patient has bilateral PVD of both lower extremities as well as excoriations from his dry skin.  He states it is worse after he showers but denies taking hot showers. AIMS: Facial and Oral Movements Muscles of  Facial Expression: None, normal Lips and Perioral Area: None, normal Jaw: None, normal Tongue: None, normal,Extremity Movements Upper (arms, wrists, hands, fingers): None, normal Lower (legs, knees, ankles, toes): None, normal, Trunk Movements Neck, shoulders, hips: None, normal, Overall Severity Severity of abnormal movements (highest score from questions above): None, normal Incapacitation due to abnormal movements: None, normal Patient's awareness of abnormal movements (rate only patient's report): No Awareness, Dental Status Current problems with teeth and/or dentures?: No Does patient usually wear dentures?: No  CIWA:  CIWA-Ar Total: 0 COWS:     Treatment Plan Summary: Daily contact with patient to assess and evaluate symptoms and progress in treatment Medication management  Plan: Treatment Plan/Recommendations:  1. Continue Seroquel 50 mg Qhs and discontinue clonazepam due to increased sleep. 2. Continue Prozac 40 mg daily for depression. 3. Will order Eucerin cream for legs and Aveeno soap. 4. ELOS: continue to monitor. Disposition in progress.  Medical Decision Making Problem Points:  Established problem, worsening (2), New problem, with no additional work-up planned (3), Review of last therapy session (1) and Review of psycho-social stressors (1) Data Points:  Review or order clinical lab tests (1) Review or order medicine tests (1) Review of medication regiment & side effects (2) Review of new medications or change in dosage (2)  I certify that inpatient services furnished can reasonably be expected to improve the patient's condition.   Izick Gasbarro,[name] 01/17/2013 12:14 PM

## 2013-01-17 NOTE — Progress Notes (Signed)
Adult Psychoeducational Group Note  Date:  01/17/2013 Time:  10:00AM Group Topic/Focus:  Therapuetic Activity  Participation Level:  Did Not Attend   Additional Comments: Pt. Didn't attend group.   Bing Plume D 01/17/2013, 10:09 AM

## 2013-01-17 NOTE — Progress Notes (Signed)
Patient ID: Mario [name], male   DOB: 02-28-1951, 62 y.o.   MRN: [id number]  Around 11:45 a.m. pt spoke to MHT and told MHT that he was not going to lunch because his legs hurt standing in line. MHT told the patient that was his choice however she would move him to be in the front of the line so he did not have to stand long. Patient still did not go to the cafeteria for lunch. After lunch patient complained of light headedness and dizziness. Patient's vitals were taken and writer inquired if there was anything that caused his stated complaints. Patient stated, "I don't know." Vital signs were WDL for patient and patient was given water to help with possible dehydration. Patient stayed in his room. Shortly after patient was seen going into the day room for group. Patient also complained of a headache and was given PRN Tylenol for his complaint of pain. No signs of distress noted. Q15 minute safety checks are maintained and patient is safe at this time.

## 2013-01-17 NOTE — Progress Notes (Signed)
Patient ID: Mario [name], male   DOB: 04-09-1950, 62 y.o.   MRN: [id number] Morning wellness group- 09:00  The focus of this group is to educate the patient on the purpose and policies of crisis stabilization and provide a format to answer questions about their admission.  The group details unit policies and expectations of patients while admitted.  Patient did not attend group.

## 2013-01-17 NOTE — Progress Notes (Addendum)
Patient ID: Mario [name], male   DOB: 1951/01/30, 62 y.o.   MRN: [id number]  D: Patient presents with flat affect and depressed mood. Patient denies HI and A/V hallucinations. Patient endorses SI but contracts for safety. Patient states, "I won't do anything while I'm here but I will when I leave. I will look for a high place to jump off of." Patient rates his depression and hopelessness both at 10/10 today. Patient does not report any pain to writer but does rate his pain at 8/10 on his daily inventory sheet. Patient refuses any intervention stating, "nothing works." Patient does report that a decrease in his sleeping medication did help him not to feel as "groggy."  A: Writer gave patient encouragement and emotional support. Writer encouraged patient to continue to work on coping skills and find a purpose in order to move forward with his life. Writer gave patient scheduled medications per physician orders.   R: Patient is cooperative with Clinical research associate but does not appear receptive to writer's advice. Patient continues to have a negative outlook. Patient is seen in the milieu periodically talking to other patients and staff. Patient is sleeping through morning groups.  Q15 minute safety checks are maintained and patient is safe at this time.

## 2013-01-17 NOTE — Clinical Social Work Note (Addendum)
CSW met with pt individually to discuss discharge plans.  Pt is still not open to staying at any homeless shelters.  CSW discussed shelter options in [place], as well as other cities such as [place] or [employer].  Pt states that he barely knows [place] and wouldn't want to have to learn another city.  CSW discussed [employer] as an option and pt declined this option, stating that he didn't want to leave [place].  Pt states that he is paranoid around people and doesn't want to be around "crack heads".  Pt asked about halfway houses or [facility] but CSW explained that costs money and is for substance abuse, which pt is adamant about not having any history of substance abuse.  CSW has left messages for admission coordinator with [facility] with no return call back.  CSW will provide pt with a list of shelters in the community.   CSW asked pt if he has any friends or family that he could stay with instead and pt denies any support, besides his pastor.  Pt gave consent for CSW to talk with his pastor, Hendricks Limes [phone]).  CSW will contact him to see if there is any additional support the church can offer.  Pt does state that he is relieved that the church was able to help with his car insurance, so he can at least keep his car.   Pt states that he is not ready to discharge, as he continues to endorse suicidal ideation with a plan to cut with a razor or [place] and [place].  No further needs voiced by pt at this time.    CSW spoke with Hendricks Limes at this time.  Elijah Birk states that pt has been working with him and states he referred pt to sign up for unemployment, to go to the [facility] for homeless resources, go to [employer] for food stamps and referred him to the homeless shelters in [place].  Elijah Birk states that the church has paid for pt's car insurance for the next 2 months and are housing his belongings.  Elijah Birk states that he sees a lack of self motivation in pt and can't  help him any further until pt helps himslef. Elijah Birk states that once pt shows more motivation to handle what he needs to, the church may be able to help more.    Mario Ivan, LCSW 01/17/2013  2:53 PM

## 2013-01-18 MED ORDER — ARIPIPRAZOLE 5 MG PO TABS
5.0000 mg | ORAL_TABLET | Freq: Every day | ORAL | Status: DC
  Administered 2013-01-19: 5 mg via ORAL
  Filled 2013-01-18 (×3): qty 1

## 2013-01-18 NOTE — Progress Notes (Signed)
D   Pt OOB UAL on the 500 hall today.Marland KitchenHe  remains sad, depressed and flat and  States " I don't know.Marland KitchenMarland KitchenI  don't know what I'm doing...".    A He states he is " no better " today than when he was first admitted her. He rates his depression and hopelessness " 10 / 10 " and states he is " off and on " filled with suicidal ideations ( he contracts for safety with this nurse).   R He states he has ' no idea " what his dc plans are...he says he feels like MD and staff are " po'd at me" because "I'm st by this writer that no staff is judging him and / or upset that he is here.

## 2013-01-18 NOTE — [facility] Group Notes (Signed)
[facility] LCSW Aftercare Discharge Planning Group Note   01/18/2013 1:11 PM    Participation Quality:  Appropraite  Mood/Affect:  Appropriate  Depression Rating:  9  Anxiety Rating:  9  Thoughts of Suicide:  Yes  Will you contract for safety?   Yes  Current AVH:  No  Plan for Discharge/Comments:  Patient attended discharge planning group and actively participated in group.  Patient advised of not doing well.  Discharge plans are in process.  CSW provided all participants with daily workbook.   Transportation Means: Patient has transportation.   Supports:  Patient has a support system.   Kalisha Keadle, Joesph July

## 2013-01-18 NOTE — Progress Notes (Signed)
Recreation Therapy Notes   Date: 11.20.2014 Time: 2:45pm Location: 500 Hall Dayroom  Group Topic: Software engineer Activities (AAA)  Behavioral Response: Engaged, Attentive, Appropriate   Affect: Euthymic  Clinical Observations/Feedback: Dog Team: [employer] Patient interacted appropriately with peer, dog team, and LRT.    Marykay Lex Deina Lipsey, LRT/CTRS  Jessi Pitstick L 01/18/2013 8:56 AM

## 2013-01-18 NOTE — Progress Notes (Signed)
[facility] Group Notes:  (Nursing/MHT/Case Management/Adjunct)  Date:  01/18/2013  Time:  8:00 p.m.   Type of Therapy:  Psychoeducational Skills  Participation Level:  Minimal  Participation Quality:  Resistant  Affect:  Flat  Cognitive:  Lacking  Insight:  Lacking  Engagement in Group:  Limited  Modes of Intervention:  Education  Summary of Progress/Problems: The patient had very little to share with the group except to say that he had a "so so" day. As for the theme of the day, he shared that his relapse prevention involves not procrastinating.   Hazle Coca S 01/18/2013, 10:09 PM

## 2013-01-18 NOTE — Progress Notes (Signed)
Patient ID: Mario [name], male   DOB: 06/12/1950, 62 y.o.   MRN: [id number] [facility] MD Progress Note  01/18/2013 4:22 PM [name]  MRN:  [id number]  Subjective: Draycen again is isolating to his room stating that he has a headache. He rates his depression at an 8-9 and still reports suicidal ideation.  When it is pointed out that he is not making any effort to find lodging when he is discharged he states that "I've just given up."  He reports poor sleep, worsening suicidal ideation and depression with his anxiety is increasing. He reports that he is the same as when he arrived.  Diagnosis:   DSM5: Schizophrenia Disorders:   Obsessive-Compulsive Disorders:   Trauma-Stressor Disorders:   Substance/Addictive Disorders:   Depressive Disorders:  Major Depressive Disorder - Severe (296.23)  Axis I: Major Depression, single episode  ADL's:  Intact  Sleep: Poor  Appetite:  Poor  Suicidal Ideation:  Patient endorses suicide ideation and her contract for safety Homicidal Ideation:  Denied AEB (as evidenced by):  Psychiatric Specialty Exam: ROS  Blood pressure 151/86, pulse 72, temperature 97.2 F (36.2 C), temperature source Oral, resp. rate 18, height 6' 3.5" (1.918 m), weight 119.296 kg (263 lb), SpO2 95.00%.Body mass index is 32.43 kg/(m^2).  General Appearance: Disheveled and Guarded  Patent attorney::  Fair  Speech:  Clear and Coherent  Volume:  Decreased  Mood:  Anxious, Depressed, Hopeless and Worthless  Affect:  Depressed and Flat  Thought Process:  Goal Directed and Intact  Orientation:  Full (Time, Place, and Person)  Thought Content:  Rumination  Suicidal Thoughts:  Yes.  without intent/plan  Homicidal Thoughts:  No  Memory:  Immediate;   Fair  Judgement:  Impaired  Insight:  Lacking  Psychomotor Activity:  Psychomotor Retardation  Concentration:  Fair  Recall:  Fair  Akathisia:  NA  Handed:  Right  AIMS (if indicated):     Assets:  Communication Skills Desire for  Improvement Physical Health Resilience  Sleep:  Number of Hours: 6.5   Current Medications: Current Facility-Administered Medications  Medication Dose Route Frequency Provider Last Rate Last Dose  . acetaminophen (TYLENOL) tablet 650 mg  650 mg Oral Q6H PRN Kristeen Mans, NP   650 mg at 01/15/13 1205  . alum & mag hydroxide-simeth (MAALOX/MYLANTA) 200-200-20 MG/5ML suspension 30 mL  30 mL Oral Q4H PRN Kristeen Mans, NP      . clonazePAM Scarlette Calico) tablet 0.5 mg  0.5 mg Oral BID Nehemiah Settle, MD   0.5 mg at 01/15/13 0817  . FLUoxetine (PROZAC) capsule 40 mg  40 mg Oral Daily Nehemiah Settle, MD   40 mg at 01/15/13 0817  . lisinopril (PRINIVIL,ZESTRIL) tablet 20 mg  20 mg Oral Daily Kristeen Mans, NP   20 mg at 01/15/13 0817  . magnesium hydroxide (MILK OF MAGNESIA) suspension 30 mL  30 mL Oral Daily PRN Kristeen Mans, NP      . neomycin-bacitracin-polymyxin (NEOSPORIN) ointment   Topical BID Kristeen Mans, NP      . nicotine (NICODERM CQ - dosed in mg/24 hours) patch 21 mg  21 mg Transdermal Daily Nehemiah Settle, MD   21 mg at 01/15/13 0815  . traZODone (DESYREL) tablet 100 mg  100 mg Oral QHS Nehemiah Settle, MD        Lab Results: No results found for this or any previous visit (from the past 48 hour(s)).  Physical Findings: patient has bilateral  PVD of both lower extremities as well as excoriations from his dry skin.  He states it is worse after he showers but denies taking hot showers. AIMS: Facial and Oral Movements Muscles of Facial Expression: None, normal Lips and Perioral Area: None, normal Jaw: None, normal Tongue: None, normal,Extremity Movements Upper (arms, wrists, hands, fingers): None, normal Lower (legs, knees, ankles, toes): None, normal, Trunk Movements Neck, shoulders, hips: None, normal, Overall Severity Severity of abnormal movements (highest score from questions above): None, normal Incapacitation due to abnormal  movements: None, normal Patient's awareness of abnormal movements (rate only patient's report): No Awareness, Dental Status Current problems with teeth and/or dentures?: No Does patient usually wear dentures?: No  CIWA:  CIWA-Ar Total: 0 COWS:     Treatment Plan Summary: Daily contact with patient to assess and evaluate symptoms and progress in treatment Medication management  Plan: Treatment Plan/Recommendations:  1. Continue Seroquel 50 mg Qhs and discontinue clonazepam due to increased sleep. 2. Continue Prozac 40 mg daily for depression. 3. 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 4. Patient encouraged to seek out CM, attend all groups and to make more of an effort of recovering.   Medical Decision Making Problem Points:  Established problem, worsening (2), New problem, with no additional work-up planned (3), Review of last therapy session (1) and Review of psycho-social stressors (1) Data Points:  Review or order clinical lab tests (1) Review or order medicine tests (1) Review of medication regiment & side effects (2) Review of new medications or change in dosage (2)  I certify that inpatient services furnished can reasonably be expected to improve the patient's condition.   MASHBURN,NEIL 01/18/2013 4:22 PM  Reviewed the information documented and agree with the treatment plan.  Benton Tooker,[name] 01/19/2013 12:09 PM

## 2013-01-18 NOTE — Progress Notes (Signed)
D: Pt is flat in affect and depressed in mood. Reports positive for suicidal ideation. He is currently contracting for safety. Pt reports the urge to give up and continue with a plan to commit suicide at discharge. He denied having anything or anyone positive in his life. He does not like the plan of being discharged to a homeless shelter for the possibility of being around "crackheads". Pt has been up and active on the unit. Pt attended Karaoke this evening.  A: Writer administered scheduled medications to pt. Continued support and availability as needed was extended to this pt. Staff continue to monitor pt with q21min checks.  R: No adverse drug reactions noted. Pt receptive to treatment. Pt remains safe at this time.

## 2013-01-19 MED ORDER — ARIPIPRAZOLE 5 MG PO TABS
5.0000 mg | ORAL_TABLET | Freq: Every day | ORAL | Status: DC
  Administered 2013-01-19: 5 mg via ORAL
  Filled 2013-01-19 (×4): qty 1

## 2013-01-19 NOTE — Progress Notes (Signed)
.  Psychoeducational Group Note    Date: 01/19/2013 Time:  0930   Goal Setting Purpose of Group: To be able to set a goal that is measurable and that can be accomplished in one day Participation Level:  Active  Participation Quality:  Appropriate  Affect:  Appropriate  Cognitive:  Oriented  Insight:  improving  Engagement in Group:  Engaged  Additional Comments:  Participated and paid attention throughout the whole group.   Mario [name] A  

## 2013-01-19 NOTE — Progress Notes (Signed)
  Date: 01/19/2013 Time:  1015  Group Topic/Focus:  Identifying Needs:   The focus of this group is to help patients identify their personal needs that have been historically problematic and identify healthy behaviors to address their needs.  Participation Level:  Active  Participation Quality:  Attentive  Affect:  Appropriate  Cognitive:  Oriented  Insight:  Improving  Engagement in Group:  Engaged  Additional Comments:  Was involved in the group, shared and paid attention.  Dione Housekeeper

## 2013-01-19 NOTE — Progress Notes (Signed)
Writer spoke with patient 1:1 and he reports his day as being so-so. When writer asked him to elaborate, patient is discouraged with his discharge plans of being discharged to a shelter. Patient reports that he plans to get a referral to [facility] which is for veterans. Patient reports that he probably would have been better off not coming in. Writer encouraged patient to be hopeful that things will work out for him to be accepted to [facility]. Patient has flat affect but is receptive to encouragement. Patient reports passive si and verbally contracts for safety. Patient denies hi/a/v hallucinations. Safety maintained with 15 min checks.

## 2013-01-19 NOTE — Clinical Social Work Note (Signed)
Clinical Social Work Note  At the request of physician extender, CSW met with patient individually to provide him with some specific tasks to undertake with regard to moving his discharge forward.  A packet had been prepared for him which lists the shelters available to him in this county, since he wants to stay local, as well as a worksheet to help him make a list of negatives and positives about each, with the end result of a decision.  He stated initially he could not read the print and CSW told him repeatedly to ask MHTs or other patients to assist with this.  He stated he was quite disturbed by a physician extender earlier, felt that it was being implied that he is not trying to get better.  He stated he feels "only slightly" better in his mood than when he arrived.  He reported that he is feeling quite sedated currently due to a change in medication, but that he is glad something different is being tried since the first thing was not really helping significantly.  He wants to get in touch with the [facility] on Monday to see if they can help him get into [facility], but CSW also emphasized to him that the [place] system is under attack for under-performance for many vets and that he cannot just wait for their help.  Hee needs to use his Child psychotherapist in this facility to see if he can be referred to [facility].  CSW also recommended to him that he call [facility] directly and ask if there are currently any available beds, in order to have the most complete information with which to make plans.  CSW mentioned to the patient that it is reported he has expressed some hesitancy in going to a shelter, but that this may be his option if none other is available.  He talked again about he procrastinated, and that is how he ended up losing his job and apartment.  CSW related this to his current situation and told him he does not want to continue to procrastinate and find himself not only  homeless, but on the street rather than in a shelter.  He did not state outright that he would do the worksheet, but indicated some willingness to do it.  The patient stated previously his only plan had been to wait until his money ran out, then kill himself.  He did not reiterate this plan at this time or make any suicidal statements.  He was unable to state that he is glad he survived his suicide attempt.    Ambrose Mantle, LCSW 01/19/2013, 5:21 PM

## 2013-01-19 NOTE — Progress Notes (Signed)
Patient ID: Mario [name], male   DOB: 07-25-1950, 62 y.o.   MRN: [id number] [facility] MD Progress Note  01/19/2013 1:26 PM Mario [name]  MRN:  [id number]  Subjective: [name] again today states his depression is a 9/10, and his anxiety is an 8-9/10. His affect has changed minimally from his admission. He is up and attending groups and states that he did make a few phone calls yesterday, but he has had trouble getting an open phone. He also said he called [employer] but you have to be able to lift 50 #. He was able to lift an empty wheel chair during our discussion but quickly roadblocks with "that wasn't 50#." He goes on to disclose that he has a brother in [place], a Sister in [place], and a brother in [place] with whom he used to be close. But the last time he reached out to his brother there were some problems which [name] did not go into other than his brother did not like the fact that [name] had married an [place] American woman. He was encouraged to call his other brother and sister, but again, "roadblocked with "I don't have their numbers." Objective: While the patient continues to endorse maximum depression and anxiety, his behavior is suggestive of malingering. Diagnosis:   DSM5: Schizophrenia Disorders:   Obsessive-Compulsive Disorders:   Trauma-Stressor Disorders:   Substance/Addictive Disorders:   Depressive Disorders:  Major Depressive Disorder - Severe (296.23)  Axis I: Major Depression, single episode  ADL's:  Intact  Sleep: Poor  Appetite:  Poor  Suicidal Ideation:  Patient endorses suicide ideation and her contract for safety Homicidal Ideation:  Denied AEB (as evidenced by):  Psychiatric Specialty Exam: Review of Systems  Constitutional: Negative.  Negative for fever, chills, weight loss, malaise/fatigue and diaphoresis.  HENT: Negative for congestion and sore throat.   Eyes: Negative for blurred vision, double vision and photophobia.  Respiratory: Negative for cough,  shortness of breath and wheezing.   Cardiovascular: Negative for chest pain, palpitations and PND.  Gastrointestinal: Negative for heartburn, nausea, vomiting, abdominal pain, diarrhea and constipation.  Musculoskeletal: Negative for falls, joint pain and myalgias.  Neurological: Negative for dizziness, tingling, tremors, sensory change, speech change, focal weakness, seizures, loss of consciousness, weakness and headaches.  Endo/Heme/Allergies: Negative for polydipsia. Does not bruise/bleed easily.  Psychiatric/Behavioral: Negative for depression, suicidal ideas, hallucinations, memory loss and substance abuse. The patient is not nervous/anxious and does not have insomnia.     Blood pressure 131/88, pulse 92, temperature 97.5 F (36.4 C), temperature source Oral, resp. rate 16, height 6' 3.5" (1.918 m), weight 119.296 kg (263 lb), SpO2 95.00%.Body mass index is 32.43 kg/(m^2).  General Appearance: Disheveled and Guarded  Eye Contact::  Fair  Speech:  Clear and Coherent  Volume:  Decreased  Mood:  Depressed with more emotion today.  Affect:  Not as flat as he has been  Thought Process:  Goal Directed and Intact  Orientation:  Full (Time, Place, and Person)  Thought Content:  Rumination  Suicidal Thoughts:  Yes.  without intent/plan  Homicidal Thoughts:  No  Memory:  Immediate;   Fair  Judgement:  Impaired  Insight:  Lacking  Psychomotor Activity:  Psychomotor Retardation  Concentration:  Fair  Recall:  Fair  Akathisia:  NA  Handed:  Right  AIMS (if indicated):     Assets:  Communication Skills Desire for Improvement Physical Health Resilience  Sleep:  Number of Hours: 6.75   Current Medications: Current Facility-Administered Medications  Medication Dose  Route Frequency Provider Last Rate Last Dose  . acetaminophen (TYLENOL) tablet 650 mg  650 mg Oral Q6H PRN Kristeen Mans, NP   650 mg at 01/15/13 1205  . alum & mag hydroxide-simeth (MAALOX/MYLANTA) 200-200-20 MG/5ML suspension  30 mL  30 mL Oral Q4H PRN Kristeen Mans, NP      . clonazePAM Scarlette Calico) tablet 0.5 mg  0.5 mg Oral BID Nehemiah Settle, MD   0.5 mg at 01/15/13 0817  . FLUoxetine (PROZAC) capsule 40 mg  40 mg Oral Daily Nehemiah Settle, MD   40 mg at 01/15/13 0817  . lisinopril (PRINIVIL,ZESTRIL) tablet 20 mg  20 mg Oral Daily Kristeen Mans, NP   20 mg at 01/15/13 0817  . magnesium hydroxide (MILK OF MAGNESIA) suspension 30 mL  30 mL Oral Daily PRN Kristeen Mans, NP      . neomycin-bacitracin-polymyxin (NEOSPORIN) ointment   Topical BID Kristeen Mans, NP      . nicotine (NICODERM CQ - dosed in mg/24 hours) patch 21 mg  21 mg Transdermal Daily Nehemiah Settle, MD   21 mg at 01/15/13 0815  . traZODone (DESYREL) tablet 100 mg  100 mg Oral QHS Nehemiah Settle, MD        Lab Results: No results found for this or any previous visit (from the past 48 hour(s)).  Physical Findings: patient has bilateral PVD of both lower extremities as well as excoriations from his dry skin.  He states it is worse after he showers but denies taking hot showers. AIMS: Facial and Oral Movements Muscles of Facial Expression: None, normal Lips and Perioral Area: None, normal Jaw: None, normal Tongue: None, normal,Extremity Movements Upper (arms, wrists, hands, fingers): None, normal Lower (legs, knees, ankles, toes): None, normal, Trunk Movements Neck, shoulders, hips: None, normal, Overall Severity Severity of abnormal movements (highest score from questions above): None, normal Incapacitation due to abnormal movements: None, normal Patient's awareness of abnormal movements (rate only patient's report): No Awareness, Dental Status Current problems with teeth and/or dentures?: No Does patient usually wear dentures?: No  CIWA:  CIWA-Ar Total: 0 COWS:     Treatment Plan Summary: Daily contact with patient to assess and evaluate symptoms and progress in treatment Medication  management  Plan: Treatment Plan/Recommendations:  1. Continue Seroquel 50 mg Qhs and discontinue clonazepam due to increased sleep. 2. Continue Prozac 40 mg daily for depression. 3. 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 4. Patient is encouraged to seek out CM today for further instructions for finding housing. 5. Will continue current plan of medication. If still minimal improvement will increase Abilify to 5mg  BID.   Medical Decision Making Problem Points:  Established problem, worsening (2), New problem, with no additional work-up planned (3), Review of last therapy session (1) and Review of psycho-social stressors (1) Data Points:  Review or order clinical lab tests (1) Review or order medicine tests (1) Review of medication regiment & side effects (2) Review of new medications or change in dosage (2)  I certify that inpatient services furnished can reasonably be expected to improve the patient's condition.   MASHBURN,NEIL 01/19/2013 1:26 PM  Agree with the plan and reviewed notes.

## 2013-01-19 NOTE — [facility] Group Notes (Signed)
[facility] Group Notes:  (Clinical Social Work)  01/19/2013   3:00-4:00PM  Summary of Progress/Problems:   The main focus of today's process group was for the patient to identify ways in which they have sabotaged their own mental health wellness/recovery.  Motivational interviewing was used to explore the reasons they engage in this behavior, and reasons they may have for wanting to change.  The Stages of Change were explained to the group using a handout, and patients identified where they are with regard to changing self-defeating behaviors.  The patient expressed that he self-sabotages by procrastination, because it allows him to escape reality and responsibilities.  The negative consequence is that he is now unemployed and homeless.  Type of Therapy:  Process Group  Participation Level:  Active  Participation Quality:  Attentive  Affect:  Anxious, Depressed and Flat  Cognitive:  Alert  Insight:  Improving  Engagement in Therapy:  Improving  Modes of Intervention:  Education, Motivational Interviewing   Mario Mantle, LCSW 01/19/2013, 4:00pm

## 2013-01-19 NOTE — Progress Notes (Signed)
[facility] Group Notes:  (Nursing/MHT/Case Management/Adjunct)  Date:  01/19/2013  Time:  8:00 p.m.   Type of Therapy:  Psychoeducational Skills  Participation Level:  Minimal  Participation Quality:  Attentive  Affect:  Flat  Cognitive:  Appropriate  Insight:  Lacking  Engagement in Group:  Lacking  Modes of Intervention:  Education  Summary of Progress/Problems: The patient shared with the group this evening that he slept in between groups and was very drowsy. As a theme for the day, he mentioned that his coping skill is to try not to procrastinate as much.   Hazle Coca S 01/19/2013, 10:04 PM

## 2013-01-20 MED ORDER — ARIPIPRAZOLE 5 MG PO TABS
5.0000 mg | ORAL_TABLET | Freq: Two times a day (BID) | ORAL | Status: DC
  Administered 2013-01-20 – 2013-01-21 (×2): 5 mg via ORAL
  Filled 2013-01-20 (×4): qty 1

## 2013-01-20 MED ORDER — TRAZODONE HCL 50 MG PO TABS
50.0000 mg | ORAL_TABLET | Freq: Every day | ORAL | Status: DC
  Filled 2013-01-20: qty 1

## 2013-01-20 MED ORDER — ZOLPIDEM TARTRATE 5 MG PO TABS
5.0000 mg | ORAL_TABLET | Freq: Every day | ORAL | Status: DC
  Administered 2013-01-20: 5 mg via ORAL
  Filled 2013-01-20: qty 1

## 2013-01-20 NOTE — Progress Notes (Signed)
Writer spoke with patient 1:1 and he continues to speak negative of his current situation and his  plans for discharge. Patient complained about the NP he saw earlier today because he felt like she was rude. Writer inquired as to what his plans are for discharge and patient reports that [facility] have [facility] available. Writer encouraged patient to follow through with his plans. Patient remains flat and sad affect. He still reports passive si and verbally contracts for safety, denies hi/a/v hallucinations. Safety maintained with 15 min checks.

## 2013-01-20 NOTE — Progress Notes (Addendum)
D Kemet remains grossly flat, sad and depressed. HE is seen moping around the halls on the unit. He is dissatisfied in regards to his treatment and the communication he has had while here in our hospital.    A HE requested , and was given , 2 tylenols for c/o bursitis pain and stated "  A little " relief afterwards.    R He attends his groups as scheduled. His abilify is changed to po bid as well as trazadone 50 for sleep. He remains pessimistic about his plans to join / be accepted at [facility] and says he will call " first thing " when he is awake MON . His am self inventory shows he rates his depression and hopelessness "7/8"  And he reports he has " off and on" SI ( but he contracts for safety with this nurse). POC cont.

## 2013-01-20 NOTE — Progress Notes (Signed)
Psychoeducational Group Note  Psychoeducational Group Note  Date: 01/20/2013 Time:  0930 Group Topic/Focus:  Gratefulness:  The focus of this group is to help patients identify what two things they are most grateful for in their lives. What helps ground them and to center them on their work to their recovery.  Participation Level:  Active  Participation Quality:  Attentive  Affect:  Appropriate  Cognitive:  Oriented  Insight:  Improving  Engagement in Group:  Engaged  Additional Comments:  Participated in group  Alannie Amodio A  RN 

## 2013-01-20 NOTE — Progress Notes (Signed)
Patient has been up in the dayroom watching tv and interacting with peers. Patient reports his day as " so-so" and continues to talk negative about his situation. Writer encouraged patient to replace negative thoughts with positive and be more optimistic about his plans rather that thinking and talking so negatively. Patient was receptive and plans to move forward with referral for [facility]. Patient even though he talks negative he has been observed laughing and telling jokes to some of the other young females in the dayroom from time to time. Patient reports passive si and verbally contracts for safety, denies hi/a/v hallucinations. Safety maintained on unit.

## 2013-01-20 NOTE — Progress Notes (Signed)
Adult Psychoeducational Group Note  Date:  01/20/2013 Time:  10:31 PM  Group Topic/Focus:  Goals Group:   The focus of this group is to help patients establish daily goals to achieve during treatment and discuss how the patient can incorporate goal setting into their daily lives to aide in recovery.  Participation Level:  Active  Participation Quality:  Appropriate  Affect:  Appropriate  Cognitive:  Appropriate  Insight: Appropriate  Engagement in Group:  Engaged  Modes of Intervention:  Discussion  Additional Comments:  Pt stated that his day was good and he made some new friends  Aldona Lento 01/20/2013, 10:31 PM

## 2013-01-20 NOTE — Clinical Social Work Note (Signed)
Clinical Social Work Note  Patient informed CSW that he had looked at the papers and worksheet given to him yesterday, but that most places are closed because it is the weekend.  He intends to call some places, including [facility], tomorrow.  He asked what PTSD is, and when a brief explanation was rendered about reacting to a previous trauma, he responded "You mean like my ex-wife?"  CSW explained further about the diagnosis, and he stated that he had never seen combat in the military and was not molested as a child.    When CSW asked the reason for his inquiry, he stated that he had been told he would not qualify for disability based on his diagnosis of bursitis, and he had wondered about PTSD.    Ambrose Mantle, LCSW 01/20/2013, 4:46 PM

## 2013-01-20 NOTE — Progress Notes (Signed)
Patient ID: Mario [name], male   DOB: Sep 13, 1950, 62 y.o.   MRN: [id number] [facility] MD Progress Note  01/20/2013 3:19 PM Mario [name]  MRN:  [id number]  Subjective: Patient states that he did not sleep last night, and states he had to nap between groups yesterday as well as today. The seroquel was discontinued and the Abilify was restarted. He is ok with trying Trazodone. 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 He is not on disability, but has worked at [employer] [place] for 10 years.  He is interested in getting a referral to [facility] as a place to live.  Today he rates his depression at a 7/10, reports his SI has decreased >50%, and rates his anxiety as a 6-7/10. This is improvement over what he has shown.   Diagnosis:   DSM5: Schizophrenia Disorders:   Obsessive-Compulsive Disorders:   Trauma-Stressor Disorders:   Substance/Addictive Disorders:   Depressive Disorders:  Major Depressive Disorder - Severe (296.23)  Axis I: Major Depression, single episode  ADL's:  Intact  Sleep: Poor  Appetite:  Poor  Suicidal Ideation:  Patient endorses suicide ideation and her contract for safety Homicidal Ideation:  Denied AEB (as evidenced by):  Psychiatric Specialty Exam: Review of Systems  Constitutional: Negative.  Negative for fever, chills, weight loss, malaise/fatigue and diaphoresis.  HENT: Negative for congestion and sore throat.   Eyes: Negative for blurred vision, double vision and photophobia.  Respiratory: Negative for cough, shortness of breath and wheezing.   Cardiovascular: Negative for chest pain, palpitations and PND.  Gastrointestinal: Negative for heartburn, nausea, vomiting, abdominal pain, diarrhea and constipation.  Musculoskeletal: Negative for falls, joint pain and myalgias.  Neurological: Negative for dizziness, tingling, tremors, sensory change, speech change, focal weakness, seizures, loss of consciousness, weakness  and headaches.  Endo/Heme/Allergies: Negative for polydipsia. Does not bruise/bleed easily.  Psychiatric/Behavioral: Negative for depression, suicidal ideas, hallucinations, memory loss and substance abuse. The patient is not nervous/anxious and does not have insomnia.     Blood pressure 138/96, pulse 98, temperature 97.2 F (36.2 C), temperature source Oral, resp. rate 16, height 6' 3.5" (1.918 m), weight 119.296 kg (263 lb), SpO2 95.00%.Body mass index is 32.43 kg/(m^2).  General Appearance: Disheveled and Guarded  Eye Contact::  Fair  Speech:  Clear and Coherent  Volume:  Decreased  Mood:  Depressed with more emotion today.  Affect:  Not as flat as he has been  Thought Process:  Goal Directed and Intact  Orientation:  Full (Time, Place, and Person)  Thought Content:  Rumination  Suicidal Thoughts:  Yes.  without intent/plan  Homicidal Thoughts:  No  Memory:  Immediate;   Fair  Judgement:  Impaired  Insight:  Lacking  Psychomotor Activity:  Psychomotor Retardation  Concentration:  Fair  Recall:  Fair  Akathisia:  NA  Handed:  Right  AIMS (if indicated):     Assets:  Communication Skills Desire for Improvement Physical Health Resilience  Sleep:  Number of Hours: 6   Current Medications: Current Facility-Administered Medications  Medication Dose Route Frequency Provider Last Rate Last Dose  . acetaminophen (TYLENOL) tablet 650 mg  650 mg Oral Q6H PRN Kristeen Mans, NP   650 mg at 01/15/13 1205  . alum & mag hydroxide-simeth (MAALOX/MYLANTA) 200-200-20 MG/5ML suspension 30 mL  30 mL Oral Q4H PRN Kristeen Mans, NP      . clonazePAM (KLONOPIN) tablet 0.5 mg  0.5 mg Oral BID  Nehemiah Settle, MD   0.5 mg at 01/15/13 0817  . FLUoxetine (PROZAC) capsule 40 mg  40 mg Oral Daily Nehemiah Settle, MD   40 mg at 01/15/13 0817  . lisinopril (PRINIVIL,ZESTRIL) tablet 20 mg  20 mg Oral Daily Kristeen Mans, NP   20 mg at 01/15/13 0817  . magnesium hydroxide (MILK OF  MAGNESIA) suspension 30 mL  30 mL Oral Daily PRN Kristeen Mans, NP      . neomycin-bacitracin-polymyxin (NEOSPORIN) ointment   Topical BID Kristeen Mans, NP      . nicotine (NICODERM CQ - dosed in mg/24 hours) patch 21 mg  21 mg Transdermal Daily Nehemiah Settle, MD   21 mg at 01/15/13 0815  . traZODone (DESYREL) tablet 100 mg  100 mg Oral QHS Nehemiah Settle, MD        Lab Results: No results found for this or any previous visit (from the past 48 hour(s)).  Physical Findings: patient has bilateral PVD of both lower extremities as well as excoriations from his dry skin.  He states it is worse after he showers but denies taking hot showers. AIMS: Facial and Oral Movements Muscles of Facial Expression: None, normal Lips and Perioral Area: None, normal Jaw: None, normal Tongue: None, normal,Extremity Movements Upper (arms, wrists, hands, fingers): None, normal Lower (legs, knees, ankles, toes): None, normal, Trunk Movements Neck, shoulders, hips: None, normal, Overall Severity Severity of abnormal movements (highest score from questions above): None, normal Incapacitation due to abnormal movements: None, normal Patient's awareness of abnormal movements (rate only patient's report): No Awareness, Dental Status Current problems with teeth and/or dentures?: No Does patient usually wear dentures?: No  CIWA:  CIWA-Ar Total: 0 COWS:     Treatment Plan Summary: Daily contact with patient to assess and evaluate symptoms and progress in treatment Medication management  Plan: Treatment Plan/Recommendations:  1. Abilify to be increased to BID. 2. Continue Prozac 40 mg daily for depression. 3. 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 4. Patient is encouraged to seek out CM today for further instructions for finding housing. 5. ELOS: 1-2 days. 6. Will add Trazodone 50mg  for sleep. Medical Decision Making Problem Points:  Established problem, worsening  (2), New problem, with no additional work-up planned (3), Review of last therapy session (1) and Review of psycho-social stressors (1) Data Points:  Review or order clinical lab tests (1) Review or order medicine tests (1) Review of medication regiment & side effects (2) Review of new medications or change in dosage (2)  I certify that inpatient services furnished can reasonably be expected to improve the patient's condition.  Rona Ravens. Mashburn RPAC 3:29 PM 01/20/2013 I agreed with findings and treatment plan of this patient

## 2013-01-20 NOTE — [facility] Group Notes (Signed)
[facility] Group Notes:  (Clinical Social Work)  01/20/2013   1:15-2:20PM  Summary of Progress/Problems:   The main focus of today's process group was to identify the patient's current support system and decide on other supports that can be put in place.  The picture on workbook was used to discuss why additional supports are needed, then used to talk about how patients have given and received all different kinds of support.  An emphasis was placed on using counselor, doctor, therapy groups, 12-step groups, and problem-specific support groups to expand supports.  There was also an extensive discussion about what constitutes a healthy support versus an unhealthy support.  The patient expressed full comprehension of the concepts presented, and agreed that there is a need to add more supports.  The patient stated the current supports in place are his pastor and a co-worker or two.    Type of Therapy:  Process Group  Participation Level:  Active  Participation Quality:  Attentive and Sharing  Affect:  Depressed and Flat  Cognitive:  Appropriate and Oriented  Insight:  Developing/Improving  Engagement in Therapy:  Improving  Modes of Intervention:  Education,  Support and Processing  Ambrose Mantle, LCSW 01/20/2013, 4:00pm

## 2013-01-20 NOTE — Progress Notes (Signed)
Adult Psychoeducational Group Note  Date:  01/20/2013 Time:  1:15PM  Group Topic/Focus:  Healthy Support Systems  Participation Level:  Active  Participation Quality:  Appropriate  Affect:  Appropriate  Cognitive:  Appropriate  Insight: Limited  Engagement in Group:  Engaged  Modes of Intervention:  Discussion  Additional Comments:  Pt was active in the group session but was unable to name a support person initially, but eventually stated that a few coworkers "might" be support persons.   Zacarias Pontes R 01/20/2013, 2:54 PM

## 2013-01-21 ENCOUNTER — Encounter (HOSPITAL_COMMUNITY): Payer: Self-pay | Admitting: Emergency Medicine

## 2013-01-21 ENCOUNTER — Emergency Department (HOSPITAL_COMMUNITY)
Admission: EM | Admit: 2013-01-21 | Discharge: 2013-01-23 | Disposition: A | Discharge status: Other Acute Inpatient Hospital | Payer: Non-veteran care | Attending: Emergency Medicine | Admitting: Emergency Medicine

## 2013-01-21 DIAGNOSIS — J449 Chronic obstructive pulmonary disease, unspecified: Secondary | ICD-10-CM | POA: Insufficient documentation

## 2013-01-21 DIAGNOSIS — R45851 Suicidal ideations: Secondary | ICD-10-CM

## 2013-01-21 DIAGNOSIS — F17200 Nicotine dependence, unspecified, uncomplicated: Secondary | ICD-10-CM | POA: Insufficient documentation

## 2013-01-21 DIAGNOSIS — Z79899 Other long term (current) drug therapy: Secondary | ICD-10-CM | POA: Insufficient documentation

## 2013-01-21 DIAGNOSIS — J4489 Other specified chronic obstructive pulmonary disease: Secondary | ICD-10-CM | POA: Insufficient documentation

## 2013-01-21 DIAGNOSIS — M954 Acquired deformity of chest and rib: Secondary | ICD-10-CM | POA: Insufficient documentation

## 2013-01-21 DIAGNOSIS — F332 Major depressive disorder, recurrent severe without psychotic features: Secondary | ICD-10-CM | POA: Diagnosis present

## 2013-01-21 DIAGNOSIS — F411 Generalized anxiety disorder: Secondary | ICD-10-CM | POA: Insufficient documentation

## 2013-01-21 DIAGNOSIS — F3289 Other specified depressive episodes: Secondary | ICD-10-CM | POA: Insufficient documentation

## 2013-01-21 DIAGNOSIS — Z560 Unemployment, unspecified: Secondary | ICD-10-CM | POA: Insufficient documentation

## 2013-01-21 DIAGNOSIS — I10 Essential (primary) hypertension: Secondary | ICD-10-CM | POA: Insufficient documentation

## 2013-01-21 DIAGNOSIS — Z765 Malingerer [conscious simulation]: Secondary | ICD-10-CM

## 2013-01-21 DIAGNOSIS — F329 Major depressive disorder, single episode, unspecified: Secondary | ICD-10-CM

## 2013-01-21 DIAGNOSIS — F32A Depression, unspecified: Secondary | ICD-10-CM

## 2013-01-21 HISTORY — DX: Major depressive disorder, single episode, unspecified: F32.9

## 2013-01-21 HISTORY — DX: Mental disorder, not otherwise specified: F99

## 2013-01-21 HISTORY — DX: Suicide attempt, initial encounter: T14.91XA

## 2013-01-21 HISTORY — DX: Depression, unspecified: F32.A

## 2013-01-21 HISTORY — DX: Anxiety disorder, unspecified: F41.9

## 2013-01-21 LAB — COMPREHENSIVE METABOLIC PANEL WITH GFR
ALT: 19 U/L (ref 0–53)
AST: 20 U/L (ref 0–37)
Albumin: 3.5 g/dL (ref 3.5–5.2)
Alkaline Phosphatase: 101 U/L (ref 39–117)
BUN: 20 mg/dL (ref 6–23)
CO2: 26 meq/L (ref 19–32)
Calcium: 9 mg/dL (ref 8.4–10.5)
Chloride: 101 meq/L (ref 96–112)
Creatinine, Ser: 1.04 mg/dL (ref 0.50–1.35)
GFR calc Af Amer: 88 mL/min — ABNORMAL LOW
GFR calc non Af Amer: 76 mL/min — ABNORMAL LOW
Glucose, Bld: 112 mg/dL — ABNORMAL HIGH (ref 70–99)
Potassium: 4.4 meq/L (ref 3.5–5.1)
Sodium: 137 meq/L (ref 135–145)
Total Bilirubin: 0.2 mg/dL — ABNORMAL LOW (ref 0.3–1.2)
Total Protein: 7.3 g/dL (ref 6.0–8.3)

## 2013-01-21 LAB — CBC
HCT: 48.1 % (ref 39.0–52.0)
Hemoglobin: 16.7 g/dL (ref 13.0–17.0)
MCH: 31.3 pg (ref 26.0–34.0)
MCHC: 34.7 g/dL (ref 30.0–36.0)
MCV: 90.1 fL (ref 78.0–100.0)
Platelets: 267 10*3/uL (ref 150–400)
RBC: 5.34 MIL/uL (ref 4.22–5.81)
RDW: 13.9 % (ref 11.5–15.5)
WBC: 13.7 10*3/uL — ABNORMAL HIGH (ref 4.0–10.5)

## 2013-01-21 LAB — ACETAMINOPHEN LEVEL: Acetaminophen (Tylenol), Serum: <15 ug/mL (ref 10–30)

## 2013-01-21 LAB — RAPID URINE DRUG SCREEN, HOSP PERFORMED
Amphetamines: NOT DETECTED
Barbiturates: NOT DETECTED
Benzodiazepines: NOT DETECTED
Cocaine: NOT DETECTED
Opiates: NOT DETECTED
Tetrahydrocannabinol: NOT DETECTED

## 2013-01-21 LAB — ETHANOL: Alcohol, Ethyl (B): <11 mg/dL (ref 0–11)

## 2013-01-21 LAB — SALICYLATE LEVEL: Salicylate Lvl: <2 mg/dL — ABNORMAL LOW (ref 2.8–20.0)

## 2013-01-21 MED ORDER — ZOLPIDEM TARTRATE 5 MG PO TABS: 5.0000 mg | ORAL_TABLET | Freq: Every evening | ORAL | Status: DC | PRN

## 2013-01-21 MED ORDER — FLUOXETINE HCL 20 MG PO CAPS
40.0000 mg | ORAL_CAPSULE | Freq: Every day | ORAL | Status: DC
  Administered 2013-01-22 – 2013-01-23 (×2): 40 mg via ORAL
  Filled 2013-01-21 (×2): qty 2

## 2013-01-21 MED ORDER — ARIPIPRAZOLE 5 MG PO TABS
5.0000 mg | ORAL_TABLET | Freq: Two times a day (BID) | ORAL | Status: DC
  Administered 2013-01-22 – 2013-01-23 (×2): 5 mg via ORAL
  Filled 2013-01-21 (×4): qty 1

## 2013-01-21 MED ORDER — IBUPROFEN 400 MG PO TABS
600.0000 mg | ORAL_TABLET | Freq: Three times a day (TID) | ORAL | Status: DC | PRN
  Administered 2013-01-22 – 2013-01-23 (×2): 600 mg via ORAL
  Filled 2013-01-21 (×3): qty 1

## 2013-01-21 MED ORDER — TAMSULOSIN HCL 0.4 MG PO CAPS
0.4000 mg | ORAL_CAPSULE | Freq: Every day | ORAL | Status: DC
  Administered 2013-01-22: 0.4 mg via ORAL
  Filled 2013-01-21: qty 1

## 2013-01-21 MED ORDER — ARIPIPRAZOLE 5 MG PO TABS: 5.0000 mg | ORAL_TABLET | Freq: Two times a day (BID) | ORAL | Status: DC

## 2013-01-21 MED ORDER — LISINOPRIL 20 MG PO TABS: 20.0000 mg | ORAL_TABLET | Freq: Every day | ORAL | Status: DC

## 2013-01-21 MED ORDER — ACETAMINOPHEN 325 MG PO TABS
650.0000 mg | ORAL_TABLET | ORAL | Status: DC | PRN
  Administered 2013-01-22 – 2013-01-23 (×2): 650 mg via ORAL
  Filled 2013-01-21 (×4): qty 2

## 2013-01-21 MED ORDER — NICOTINE 21 MG/24HR TD PT24
21.0000 mg | MEDICATED_PATCH | Freq: Every day | TRANSDERMAL | Status: DC
  Administered 2013-01-22 – 2013-01-23 (×2): 21 mg via TRANSDERMAL
  Filled 2013-01-21 (×2): qty 1

## 2013-01-21 MED ORDER — TAMSULOSIN HCL 0.4 MG PO CAPS: 0.4000 mg | ORAL_CAPSULE | Freq: Every day | ORAL | Status: DC

## 2013-01-21 MED ORDER — ONDANSETRON HCL 4 MG PO TABS: 4.0000 mg | ORAL_TABLET | Freq: Three times a day (TID) | ORAL | Status: DC | PRN

## 2013-01-21 MED ORDER — FLUOXETINE HCL 40 MG PO CAPS: 40.0000 mg | ORAL_CAPSULE | Freq: Every day | ORAL | Status: DC

## 2013-01-21 MED ORDER — LISINOPRIL 20 MG PO TABS
20.0000 mg | ORAL_TABLET | Freq: Every day | ORAL | Status: DC
  Administered 2013-01-22 – 2013-01-23 (×2): 20 mg via ORAL
  Filled 2013-01-21 (×2): qty 1

## 2013-01-21 NOTE — ED Notes (Signed)
Patient placed in paper scrubs and gray slippers and security called to wand patient

## 2013-01-21 NOTE — ED Notes (Signed)
Patient states he is still in the mode where he wants to end it all.  Has prescription for 30 Ambien and looked it up on the internet and it said that was not enough to to do anything, tried plastic bags on his head "but it got to hot".  Discharged from [facility] today.  Stated he was angry with some of the MD's and did not want to deal with their attitudes.

## 2013-01-21 NOTE — ED Notes (Addendum)
Pt states he does not feel like he was ready to leave [facility], pt states he feels like "ending it". When asked if pt had a plan he states he was going to attempt to overdose on ambien but did not take any because he looked it up online and the dosage was not enough to kill him, pt states he tried a plastic bag but he got too hot, attempted to tie something around his neck but states he did not go through with it. Pt visibly sad and expressing hopelessness. Pt states he was in constant pain from bursitis and he lost his job in April.

## 2013-01-21 NOTE — Progress Notes (Signed)
Adult Psychoeducational Group Note  Date:  01/21/2013 Time:  11:00am Group Topic/Focus:  Self Care:   The focus of this group is to help patients understand the importance of self-care in order to improve or restore emotional, physical, spiritual, interpersonal, and financial health.  Participation Level:  Did Not Attend  Participation Quality:    Affect:    Cognitive:    Insight:   Engagement in Group:    Modes of Intervention:    Additional Comments:   Pt did not attend group Shelly Bombard D 01/21/2013, 1:43 PM

## 2013-01-21 NOTE — Progress Notes (Addendum)
[facility] Adult Case Management Discharge Plan :  Will you be returning to the same living situation after discharge: Yes,  pt was homeless on admission and will continue to be homeless at discharge.  CSW has made attempts to help pt with shelter referrals and pt has a list of all shelters in the area.  Pt continues to decline shelter as a placement option.  At discharge, do you have transportation home?:Yes,  pt has own car at the ED Do you have the ability to pay for your medications:Yes,  access to meds  Release of information consent forms completed and in the chart;  Patient's signature needed at discharge.  Patient to Follow up at: Follow-up Information   Follow up with [facility] On 01/22/2013. (Walk in on this date for hospital discharge appointment. Walk in clinic is Monday - Friday 8 am - 3 pm. They will then schedule you for medication management and therapy.)    Contact information:   [address], [place] [address] Phone: [phone] Fax: [phone]      Patient denies SI/HI:   Yes,  denies SI/HI, stating "not here".  Pt has had chronic SI.   Safety Planning and Suicide Prevention discussed:  Yes,  discussed with pt, refused consent to contact family/friend.  See suicide prevention education note.   See CSW notes in regards to assistance offered to pt during his stay.  Contact made with pt's pastor and the church can not offer any additional resources. A list of all shelters in the area, which includes emergency winter shelters, was provided for pt.  No further needs voiced by pt at this time.  Durwin Reges is a homeless liaison that works in the [place] area for the [place].  Pt is aware of this service and plans to work with her for additional VA benefits/resources.  Pt also received $20 from [facility] petty cash to help with gas.    Carmina Miller 01/21/2013, 10:54 AM

## 2013-01-21 NOTE — ED Notes (Signed)
House coverage called, will not have a sitter available until 11pm.

## 2013-01-21 NOTE — ED Provider Notes (Signed)
CSN: [id number]     Arrival date & time 01/21/13  2003 History   First MD Initiated Contact with Patient 01/21/13 2058     Chief Complaint  Patient presents with  . Suicidal   HPI  History provided by the patient and recent medical chart. The patient is a 62 year old male with history of hypertension, depression and COPD who presents with continued depression and suicidal thoughts. Patient reports that he has had worsening depression and suicidal thoughts that first began over 11 months ago. Patient states his problems began with bilateral shoulder pains and bursitis which interfered with his work. 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 Since then patient has been unemployed with worsening depression. He reports previous suicide attempts by placing a plastic bag over his head and tying around his neck. He states he has tried this twice over one month ago. He is also tried tying a wet towel over his face. He states every time he cannot take the discomfort and eventually stops himself. Patient was seen and evaluated multiple times emergency room for his depression and suicidal ideations. He was most recently in [facility] and discharged earlier today. Review of his medical records indicated that he had poor participation and psychiatric providers felt that he was malingering. Patient reports to me that he did feel like he was trying to participate in that he was not receiving the help that he needed. He denies any other changes.     Past Medical History  Diagnosis Date  . Hypertension   . Bronchitis   . Anxiety    Past Surgical History  Procedure Laterality Date  . Skin cancer excision    . Cryptorchidism     Family History  Problem Relation Age of Onset  . Dementia Father    History  Substance Use Topics  . Smoking status: Current Every Day Smoker -- 1.00 packs/day for 20 years    Types: Cigarettes  . Smokeless tobacco: Never Used  . Alcohol Use: No    Review of  Systems  All other systems reviewed and are negative.    Allergies  Demerol; Zocor; and Other  Home Medications   Current Outpatient Rx  Name  Route  Sig  Dispense  Refill  . ARIPiprazole (ABILIFY) 5 MG tablet   Oral   Take 1 tablet (5 mg total) by mouth 2 (two) times daily at 8 am and 10 pm. For depression and anxiety.   60 tablet   0   . FLUoxetine (PROZAC) 40 MG capsule   Oral   Take 1 capsule (40 mg total) by mouth daily. For depression.   30 capsule   0   . lisinopril (PRINIVIL,ZESTRIL) 20 MG tablet   Oral   Take 1 tablet (20 mg total) by mouth daily. For high blood pressure   30 tablet   0   . tamsulosin (FLOMAX) 0.4 MG CAPS capsule   Oral   Take 1 capsule (0.4 mg total) by mouth daily after breakfast. For BPH.   30 capsule   0    BP 154/97  Pulse 101  Temp(Src) 98.2 F (36.8 C) (Oral)  Resp 20  Ht 6\' 3"  (1.905 m)  Wt 265 lb 11.2 oz (120.521 kg)  BMI 33.21 kg/m2  SpO2 92% Physical Exam  Nursing note and vitals reviewed. Constitutional: He is oriented to person, place, and time. He appears well-developed and well-nourished.  HENT:  Head: Normocephalic.  Eyes: Conjunctivae are normal.  Cardiovascular: Normal rate and regular rhythm.   Pulmonary/Chest: Effort normal and breath sounds normal. No respiratory distress. He has no wheezes. He has no rales.  Pectus Carinatum present. Skin normal. No crepitus.  Abdominal: Soft. There is no tenderness. There is no rebound.  Musculoskeletal: Normal range of motion.  Neurological: He is alert and oriented to person, place, and time.  Skin: Skin is warm.  Psychiatric: His behavior is normal. He exhibits a depressed mood. He expresses suicidal ideation.    ED Course  Procedures   DIAGNOSTIC STUDIES: Oxygen Saturation is 94% on room air.    COORDINATION OF CARE:  Nursing notes reviewed. Vital signs reviewed. Initial pt interview and examination performed.   9:59 PM-Discussed work up plan with pt at  bedside, which includes medical screening and TTS evaluation. Pt agrees with plan.  Labs unremarkable. The patient is medically screened and cleared. Psychiatric holding orders in place. TTS consult placed.    Results for orders placed during the hospital encounter of 01/21/13  ACETAMINOPHEN LEVEL      Result Value Range   Acetaminophen (Tylenol), Serum <15.0  10 - 30 ug/mL  CBC      Result Value Range   WBC 13.7 (*) 4.0 - 10.5 K/uL   RBC 5.34  4.22 - 5.81 MIL/uL   Hemoglobin 16.7  13.0 - 17.0 g/dL   HCT 16.1  09.6 - 04.5 %   MCV 90.1  78.0 - 100.0 fL   MCH 31.3  26.0 - 34.0 pg   MCHC 34.7  30.0 - 36.0 g/dL   RDW 40.9  81.1 - 91.4 %   Platelets 267  150 - 400 K/uL  COMPREHENSIVE METABOLIC PANEL      Result Value Range   Sodium 137  135 - 145 mEq/L   Potassium 4.4  3.5 - 5.1 mEq/L   Chloride 101  96 - 112 mEq/L   CO2 26  19 - 32 mEq/L   Glucose, Bld 112 (*) 70 - 99 mg/dL   BUN 20  6 - 23 mg/dL   Creatinine, Ser 7.82  0.50 - 1.35 mg/dL   Calcium 9.0  8.4 - 95.6 mg/dL   Total Protein 7.3  6.0 - 8.3 g/dL   Albumin 3.5  3.5 - 5.2 g/dL   AST 20  0 - 37 U/L   ALT 19  0 - 53 U/L   Alkaline Phosphatase 101  39 - 117 U/L   Total Bilirubin 0.2 (*) 0.3 - 1.2 mg/dL   GFR calc non Af Amer 76 (*) >90 mL/min   GFR calc Af Amer 88 (*) >90 mL/min  ETHANOL      Result Value Range   Alcohol, Ethyl (B) <11  0 - 11 mg/dL  SALICYLATE LEVEL      Result Value Range   Salicylate Lvl <2.0 (*) 2.8 - 20.0 mg/dL  URINE RAPID DRUG SCREEN (HOSP PERFORMED)      Result Value Range   Opiates NONE DETECTED  NONE DETECTED   Cocaine NONE DETECTED  NONE DETECTED   Benzodiazepines NONE DETECTED  NONE DETECTED   Amphetamines NONE DETECTED  NONE DETECTED   Tetrahydrocannabinol NONE DETECTED  NONE DETECTED   Barbiturates NONE DETECTED  NONE DETECTED         MDM   1. Suicidal ideations   2. Depression         Angus Seller, PA-C 01/22/13 0532

## 2013-01-21 NOTE — ED Provider Notes (Signed)
Discussed with TTS. Psychiatry physician extender declines the patient himself, but states the patient will need to be seen by a psychiatrist in the morning.   Mario [name]. Rubin Payor, MD 01/21/13 2333

## 2013-01-21 NOTE — ED Notes (Signed)
Tele monitor placed in room at this time.

## 2013-01-21 NOTE — BH Assessment (Signed)
TTS Clinical research associate spoke with Dr. Rubin Payor, PA to obtain clinical information prior to assessing patient.  TTS consult requested for patient who presents with Depression and SI. Pt recently D/C from [facility] today.   Glorious Peach, MS, LCASA Assessment Counselor

## 2013-01-21 NOTE — BHH Group Notes (Signed)
[facility] LCSW Aftercare Discharge Planning Group Note   01/21/2013 8:45 AM  Participation Quality:  Alert and Appropriate   Mood/Affect:  Appropriate, Flat and Depressed  Depression Rating:  7  Anxiety Rating:  7  Thoughts of Suicide:  Pt denies HI, pt denies SI, stating "not here"  Will you contract for safety?   Yes  Current AVH:  Pt denies  Plan for Discharge/Comments:  Pt attended discharge planning group and actively participated in group.  CSW provided pt with today's workbook.  Pt reports poor sleep today.  Pt states that he plans to work with a Child psychotherapist with the [place] on housing on options.  Pt continues to deny wanting to stay at the shelter or going to [facility].  Pt will follow up at [facility] for medication management and therapy.  No further needs voiced by pt at this time.    Transportation Means: Pt reports access to transportation - pt has own transportation  Supports: No supports mentioned at this time  Reyes Ivan, LCSW 01/21/2013 10:26 AM

## 2013-01-21 NOTE — ED Notes (Signed)
Pt unable to void at this time. 

## 2013-01-21 NOTE — BH Assessment (Signed)
Consulted with psych extender Donell Sievert who declined patient at [facility] due to patient malingering and recent d/c from [facility] today, as patient did not benefit from inpatient treatment. Donell Sievert is requesting that patient be evaluated by a psychiatrist in the morning. Informed Dr. Rubin Payor EDP of this.  Glorious Peach, MS, LCASA Assessment Counselor

## 2013-01-21 NOTE — BHH Suicide Risk Assessment (Signed)
Suicide Risk Assessment  Discharge Assessment     Demographic Factors:  Male, Adolescent or young adult, Caucasian, Low socioeconomic status, Living alone and Unemployed  Mental Status Per Nursing Assessment::   On Admission:  NA  Current Mental Status by Physician: Mental Status Examination: Patient appeared as per his stated age, casually dressed, and fairly groomed, and maintaining good eye contact. Patient has good mood and his affect was constricted. He has normal rate, rhythm, and volume of speech. His thought process is linear and goal directed. Patient has denied suicidal, homicidal ideations, intentions or plans. Patient has no evidence of auditory or visual hallucinations, delusions, and paranoia. Patient has fair insight judgment and impulse control.  Loss Factors: Decrease in vocational status and Financial problems/change in socioeconomic status  Historical Factors: NA  Risk Reduction Factors:   Sense of responsibility to family, Religious beliefs about death, Positive therapeutic relationship and Positive coping skills or problem solving skills  Continued Clinical Symptoms:  Depression:   Recent sense of peace/wellbeing  Cognitive Features That Contribute To Risk:  Polarized thinking    Suicide Risk:  Mild:  Suicidal ideation of limited frequency, intensity, duration, and specificity.  There are no identifiable plans, no associated intent, mild dysphoria and related symptoms, good self-control (both objective and subjective assessment), few other risk factors, and identifiable protective factors, including available and accessible social support.  Discharge Diagnoses:   AXIS I:  Major Depression, Recurrent severe AXIS II:  Deferred AXIS III:   Past Medical History  Diagnosis Date  . Hypertension   . Bronchitis    AXIS IV:  economic problems, housing problems, occupational problems, other psychosocial or environmental problems, problems related to social  environment and problems with primary support group AXIS V:  51-60 moderate symptoms  Plan Of Care/Follow-up recommendations:  Activity:  As tolerated Diet:  Regular  Is patient on multiple antipsychotic therapies at discharge:  No   Has Patient had three or more failed trials of antipsychotic monotherapy by history:  No  Recommended Plan for Multiple Antipsychotic Therapies: NA  Nehemiah Settle., M.D. 01/21/2013, 12:22 PM

## 2013-01-21 NOTE — ED Notes (Signed)
Patient currently completing TTS. Sitter at bedside.

## 2013-01-21 NOTE — Progress Notes (Signed)
Pt attended spiritual care group on grief and loss facilitated by chaplain Burnis Kingfisher.  Group opened with brief discussion and psycho-social ed around grief and loss in relationships and in relation to self - identifying life patterns, circumstances, changes that cause losses. Established group norm of speaking from own life experience. Group goal of establishing open and affirming space for members to share loss and experience with grief, normalize grief experience and provide psycho social education and grief support.  Mario [name]) was present and attentive in group.  He did not contribute to group discussion.   Mario [name] MDiv

## 2013-01-21 NOTE — Discharge Summary (Signed)
Physician Discharge Summary Note  Patient:  Mario [name] is an 62 y.o., male MRN:  [id number] DOB:  1950/10/07 Patient phone:  [phone] (home)  Patient address:   [address] [place] [address],   Date of Admission:  01/12/2013 Date of Discharge: 01/21/2013   Reason for Admission:  Suicide attempt  Discharge Diagnoses: Principal Problem:   MDD (major depressive disorder), recurrent severe, without psychosis   consider malingering ROS  DSM5: DSM5:  Schizophrenia Disorders:  Obsessive-Compulsive Disorders:  Trauma-Stressor Disorders:  Substance/Addictive Disorders:  Depressive Disorders:  AXIS I: Major Depression, Recurrent severe  AXIS II: Deferred  AXIS III:  Past Medical History   Diagnosis  Date   .  Hypertension    .  Bronchitis     AXIS IV: economic problems, housing problems, occupational problems, other psychosocial or environmental problems, problems related to social environment, problems with access to health care services and problems with primary support group  AXIS V: 41-50 serious symptoms   Level of Care:  OP  Hospital Course:  Mario [name] is a 62 year old WM who was admitted after presenting to the [facility] the same day he was discharged from [facility] after a 2 day admission for chest pain. He had explained that he was depressed with SI to his RN, LCSW, and chaplin while in the hospital and referrals were made for him to follow up for outpatient therapy.        He was discharged at 2:37 pm and arrived in the [facility] at 11:30pm that same night stating that he had SI and had attempted suicide several weeks ago by tying a plastic bag over his head x 2, but self interrupted due to discomfort. He stated that he had full intent to go through with his desire to die as "he just couldn't do it any more."       Mario [name] stated that his problems began when he lost his job at [employer] [place] due to a bad back, his mother's death, and recently being evicted  from his apartment. He had been living in his car for several days before he presented to the ED with chest pain.         Mario [name] was evaluated and felt to be in need of acute psychiatric stabilization on an in patient unit with medication management.         Mario [name] was admitted to the adult unit. He was evaluated and his symptoms were identified. Medication management was discussed and initiated with Prozac.  He was oriented to the unit and encouraged to participate in unit programming. Medical problems were identified and treated appropriately. Home medication was restarted as needed.        The patient was evaluated each day by a clinical provider to ascertain the patient's response to treatment. He continued to report severe depression and suicidal ideation and showed little interest in working on his plans. Abilify was added to his regimen in an effort to reduce his symptoms.        Mario [name] was asked each day to complete a self inventory noting mood, mental status, pain, new symptoms, anxiety and concerns.  Each day he continued to report the maximum in severity of symptoms but was often seen behaving in a manner that was incongruent with his reported numbers.          He did attend some of the unit programming and had some participation. He did note that he often self  sabotages success by failing to do what is required of him. Coping skills, problem solving as well as relaxation therapies were also part of the unit programming.  He continued to be resistant to any further efforts to find housing.         It was felt that he was indeed malingering due to his lack of effort at pursuing any options for housing and poor motivation for recovery. Mario [name] was felt to be at his baseline and was discharged out with the following plan of care.  He was given prescriptions for one month, and 2 weeks worth of samples.            Consults:  None  Significant Diagnostic Studies:  labs: CBC, CMP, UA, UDS  Discharge  Vitals:   Blood pressure 121/89, pulse 109, temperature 98.1 F (36.7 C), temperature source Oral, resp. rate 18, height 6' 3.5" (1.918 m), weight 119.296 kg (263 lb), SpO2 95.00%. Body mass index is 32.43 kg/(m^2). Lab Results:   No results found for this or any previous visit (from the past 72 hour(s)).  Physical Findings: AIMS: Facial and Oral Movements Muscles of Facial Expression: None, normal Lips and Perioral Area: None, normal Jaw: None, normal Tongue: None, normal,Extremity Movements Upper (arms, wrists, hands, fingers): None, normal Lower (legs, knees, ankles, toes): None, normal, Trunk Movements Neck, shoulders, hips: None, normal, Overall Severity Severity of abnormal movements (highest score from questions above): None, normal Incapacitation due to abnormal movements: None, normal Patient's awareness of abnormal movements (rate only patient's report): No Awareness, Dental Status Current problems with teeth and/or dentures?: No Does patient usually wear dentures?: No  CIWA:  CIWA-Ar Total: 0 COWS:     Psychiatric Specialty Exam: See Psychiatric Specialty Exam and Suicide Risk Assessment completed by Attending Physician prior to discharge.  Discharge destination:  Home  Is patient on multiple antipsychotic therapies at discharge:  No   Has Patient had three or more failed trials of antipsychotic monotherapy by history:  No  Recommended Plan for Multiple Antipsychotic Therapies: NA  Discharge Orders   Future Appointments Provider Department Dept Phone   01/31/2013 4:30 PM [facility] Covering Provider [facility] [phone]   Future Orders Complete By Expires   Diet - low sodium heart healthy  As directed    Discharge instructions  As directed    Comments:     Take all of your medications as directed. Be sure to keep all of your follow up appointments.  If you are unable to keep your follow up appointment, call your Doctor's office to  let them know, and reschedule.  Make sure that you have enough medication to last until your appointment. Be sure to get plenty of rest. Going to bed at the same time each night will help. Try to avoid sleeping during the day.  Increase your activity as tolerated. Regular exercise will help you to sleep better and improve your mental health. Eating a heart healthy diet is recommended. Try to avoid salty or fried foods. Be sure to avoid all alcohol and illegal drugs.   Increase activity slowly  As directed        Medication List    STOP taking these medications       aspirin 81 MG EC tablet     buPROPion 150 MG 24 hr tablet  Commonly known as:  WELLBUTRIN XL      TAKE these medications     Indication   ARIPiprazole 5 MG  tablet  Commonly known as:  ABILIFY  Take 1 tablet (5 mg total) by mouth 2 (two) times daily at 8 am and 10 pm. For depression and anxiety.   Indication:  Major Depressive Disorder     FLUoxetine 40 MG capsule  Commonly known as:  PROZAC  Take 1 capsule (40 mg total) by mouth daily. For depression.   Indication:  Depression     lisinopril 20 MG tablet  Commonly known as:  PRINIVIL,ZESTRIL  Take 1 tablet (20 mg total) by mouth daily. For high blood pressure   Indication:  High Blood Pressure     tamsulosin 0.4 MG Caps capsule  Commonly known as:  FLOMAX  Take 1 capsule (0.4 mg total) by mouth daily after breakfast. For BPH.   Indication:  Enlarged Prostate with Urination Problems           Follow-up Information   Follow up with [facility] On 01/22/2013. (Walk in on this date for hospital discharge appointment. Walk in clinic is Monday - Friday 8 am - 3 pm. They will then schedule you for medication management and therapy.)    Contact information:   [address] [place] [address] Phone: [phone] Fax: [phone]      Follow-up recommendations:   Activities: Resume activity as tolerated. Diet: Heart healthy low sodium diet Tests: Follow up  testing will be determined by your out patient provider. Comments:    Total Discharge Time:  Greater than 30 minutes.  Signed: MASHBURN,NEIL 01/21/2013, 10:22 AM  Patient seen face-to-face interview for psychiatric evaluation, suicide risk assessment, discussed this with the treatment team and formulated treatment plan.Reviewed the information documented and agree with the discharge treatment plan.  Asjia Berrios,[name] 01/21/2013 6:14 PM

## 2013-01-21 NOTE — Progress Notes (Signed)
Patient ID: Mario [name], male   DOB: 02-25-1951, 62 y.o.   MRN: [id number]  D: Patient presents with flat affect and depressed mood. Patient did endorse SI this morning but could contract for safety. Patient denied HI and A/V hallucinations. Patient rated his depression and hopelessness symptoms at a 7/10 today. Patient c.o blurred vision and lightheadedness but vital signs were WDL and writer encouraged patient to push fluids. Patient did not have unsteady gait and did not appear impaired by stated complaints. On discharge, patient denies SI/HI and A/V hallucinations. Patient did have head pain this morning and requested PRN Tylenol. Patient stated that the weekend "white woman in business casual dress" promised him $20 dollars cash for gas. Writer inquired about who this person was. Patient said, "I think a Child psychotherapist."  A: Clinical research associate encouraged patient and offered emotional support. Writer gave patient prescribed medications and PRN Tylenol per physician's orders. Patient was given $88 cash for gas money per Child psychotherapist. Patient was given discharge and medication instructions and information was gone over by Clinical research associate.   R: Patient was receptive with Clinical research associate. Patient verbalized understanding of discharge and medication instructions and had no questions or concerns. Patient belongings were returned to patient and patient signed belongings sheet signifying that patient received all his items back. Q15 minute safety checks were maintained until patient was discharge. No signs of distress noted with patient upon discharge.

## 2013-01-22 ENCOUNTER — Encounter (HOSPITAL_COMMUNITY): Payer: Self-pay | Admitting: *Deleted

## 2013-01-22 NOTE — BH Assessment (Signed)
Per notes written by Verne Spurr, NP:  "This patient was presented for admission <24 hours after being discharged from [facility] where he was poorly motivated for recovery and felt to be malingering. He is declined for readmission to [facility] at this time, he has outpatient follow up information in his possession and should keep his scheduled appointments. He was given samples and prescriptions to use until his appointment".  "This was discussed with Dr. Elsie Saas who agrees with the above"  Writer will pass the above information in shift report. As this patient's recommended discharge plan is to "discharge home." No further actions needed from TTS.

## 2013-01-22 NOTE — BH Assessment (Signed)
[facility] Assessment Progress Note  Per Donell Sievert, PA, given that pt is endorsing SI w/ plan to slit throat and wrist, he is not safe for discharge.  However pt is too acute for admission to [facility].  Outside placement is to be sought for pt.  At 20:30 I spoke to Heide Guile, MHT to notify her.  She agrees to seek outside placement for pt.  Doylene Canning, MA Triage Specialist 01/22/2013 @ 20:33

## 2013-01-22 NOTE — Progress Notes (Signed)
Patient ID: Mario [name], male   DOB: 1950/03/06, 62 y.o.   MRN: [id number]   This patient was presented for admission <24 hours after being discharged from [facility] where he was poorly motivated for recovery and felt to be malingering. He is declined for readmission to [facility] at this time, he has outpatient follow up information in his possession and should keep his scheduled appointments.  He was given samples and prescriptions to use until his appointment. This was discussed with Dr. Elsie Saas who agrees with the above. [name] 01/22/2013 2:27 PM Reviewed the information documented and agree with the treatment plan.  [name],[name] 01/28/2013 8:52 AM

## 2013-01-22 NOTE — ED Provider Notes (Signed)
TTS consult is appreciated. The note states that there is concern that the patient is malingering and had just been discharged from [facility]. Note also states that the patient would be evaluated by a psychiatrist in the morning. There is no note from psychiatry. Applied to evaluate the patient and he confirms that he is suicidal, he has a visible at night and his box and that if he went home, he would cut his wrist and throat with a razor blade knife tonight. At this point, discharge is being held and 12 patient can get evaluation by psychiatrist.  Dione Booze, MD 01/22/13 [phone]
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
## 2013-01-22 NOTE — ED Provider Notes (Addendum)
Patient discussed with Verne Spurr and Dr. Shela Commons. discharged from behavioral health yesterday. They feel patient is malingering. Patient is in no distress. He did not attend any groups when he was hospitalized. He denies any current suicidal ideation stating it comes and goes. He denies any homicidal ideation. No hallucinations. He doesn't have a current plan for suicide. BP 155/93  Pulse 83  Temp(Src) 98.2 F (36.8 C) (Oral)  Resp 20  Ht 6\' 3"  (1.905 m)  Wt 265 lb 11.2 oz (120.521 kg)  BMI 33.21 kg/m2  SpO2 97%  Upon attempted discharge, patient told nurse he was planning to hurt himself with a razorblade.  Discharge will be held. D/w Dr. Preston Fleeting who will reconsult TTS.  Glynn Octave, MD 01/22/13 1454  Glynn Octave, MD 01/22/13 1610  Glynn Octave, MD 01/23/13 [phone]

## 2013-01-23 ENCOUNTER — Emergency Department (HOSPITAL_COMMUNITY)
Admission: EM | Admit: 2013-01-23 | Discharge: 2013-01-24 | Disposition: A | Discharge status: Home / Self Care | Payer: Non-veteran care | Attending: Emergency Medicine | Admitting: Emergency Medicine

## 2013-01-23 ENCOUNTER — Encounter (HOSPITAL_COMMUNITY): Payer: Self-pay | Admitting: Emergency Medicine

## 2013-01-23 DIAGNOSIS — R45851 Suicidal ideations: Secondary | ICD-10-CM | POA: Insufficient documentation

## 2013-01-23 DIAGNOSIS — F3289 Other specified depressive episodes: Secondary | ICD-10-CM | POA: Insufficient documentation

## 2013-01-23 DIAGNOSIS — F411 Generalized anxiety disorder: Secondary | ICD-10-CM | POA: Insufficient documentation

## 2013-01-23 DIAGNOSIS — Z79899 Other long term (current) drug therapy: Secondary | ICD-10-CM | POA: Insufficient documentation

## 2013-01-23 DIAGNOSIS — Z880 Allergy status to penicillin: Secondary | ICD-10-CM | POA: Insufficient documentation

## 2013-01-23 DIAGNOSIS — F17200 Nicotine dependence, unspecified, uncomplicated: Secondary | ICD-10-CM | POA: Insufficient documentation

## 2013-01-23 DIAGNOSIS — Z8709 Personal history of other diseases of the respiratory system: Secondary | ICD-10-CM | POA: Insufficient documentation

## 2013-01-23 DIAGNOSIS — F329 Major depressive disorder, single episode, unspecified: Secondary | ICD-10-CM | POA: Insufficient documentation

## 2013-01-23 DIAGNOSIS — I10 Essential (primary) hypertension: Secondary | ICD-10-CM | POA: Insufficient documentation

## 2013-01-23 NOTE — ED Notes (Signed)
Patient is resting comfortably. 

## 2013-01-23 NOTE — ED Notes (Signed)
Patient received meal tray.  Patient currently eating.
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
## 2013-01-23 NOTE — ED Notes (Signed)
Waiting for social work to come and speak with patient before he is released.

## 2013-01-23 NOTE — ED Notes (Signed)
Patient still waiting for discharge.  Patient had asked to stay one more night.   Dr. Elesa Massed working with [facility] to determine if okay to send patient home or to keep until tomorrow.

## 2013-01-23 NOTE — Progress Notes (Signed)
Writer consulted with Aggie after she completed a Tele Psych with the patient.  Aggie reports that the patient does not meet criteria for inpatient hospitalization and he will be discharged.  Aggie recommends that the patient will follow up with [facility].    Writer informed the ER MD (Dr. Elesa Massed).  Dr. Elesa Massed reports that she will discharge the patient after the NP, Aggie has submitted her Tele Psych Assessment in the epic chart.

## 2013-01-23 NOTE — ED Notes (Signed)
Patient had telepsych and advised that he was "not happy with the person I talked to.  She kept cutting me off and wouldn't let me speak.  I'm not ready to go home".   Patient expressed to sitter that he is homeless.

## 2013-01-23 NOTE — Progress Notes (Signed)
Writer informed the nurse working with the patient (Mario [name]) and the ER MD (Dr. Elesa Massed) that the patient will receive a Tele Psych with the NP, (Aggie) at 12:00

## 2013-01-23 NOTE — ED Provider Notes (Addendum)
12:27 PM  Patient here for 40+ hours with complaints of suicidal ideation. ED notes ?malingering because he is homeless and needs a place to stay. Behavioral health has assessed the patient I do not feel that he needs inpatient treatment at this time I feel he is safe for discharge. We'll have social work discussed with patient. Have referred back to [facility] for further management.  3:14 PM  Patient was seen in ED by SW, Lupita Leash, who reports pt has place to stay at [facility].  He states that he has not here because of a place to stay but rather because of his worsening depression due to loss of family member. D/w Minerva Areola with BH.  Patient still endorsing suicidal ideation with plan to cut his wrists or throat. He states he has access to a razor blade to use to do this. He states he has had prior suicide attempts. He does not contract for safety and does not feel safe for discharge. Discussed with paper health who recommends transfer to [facility] for the patient to be seen by a psychiatrist today.  3:29 PM  Discussed with Dr. Manus Gunning in the emergency department at [facility] who agrees to accept the patient. Have attempted to d/w Dr. Lucianne Muss or Dr. Dub Mikes multiple times today but unsuccessful in reaching on call psychiatrist.  Also discussed with charge nurse and psych ED nurse.  Will transferred to [facility] per behavioral health request Minerva Areola). Patient agrees to this plan. He agrees to voluntary evaluation.  Layla Maw Jannat Rosemeyer, DO 01/23/13 1228  Layla Maw Eliese Kerwood, DO 01/23/13 1517  Layla Maw Eusevio Schriver, DO 01/23/13 1619  Layla Maw Chrishawn Kring, DO 01/23/13 1610

## 2013-01-23 NOTE — ED Provider Notes (Signed)
CSN: [id number]     Arrival date & time 01/23/13  1915 History   First MD Initiated Contact with Patient 01/23/13 1916     Chief Complaint  Patient presents with  . Medical Clearance   (Consider location/radiation/quality/duration/timing/severity/associated sxs/prior Treatment) HPI Pt presenting in transfer from [facility] for further psych evaluation.  Pt was being seen at [facility] ED for c/o suicidality.  Per chart review, there was some concern for malingering due to patient being homeless.  However, pt was not willing to contract for safety.  States he has been feeling more depressed due to recent loss of family member.    Past Medical History  Diagnosis Date  . Hypertension   . Bronchitis   . Anxiety   . Mental disorder   . Depression   . Suicide attempt    Past Surgical History  Procedure Laterality Date  . Skin cancer excision    . Cryptorchidism     Family History  Problem Relation Age of Onset  . Dementia Father    History  Substance Use Topics  . Smoking status: Current Every Day Smoker -- 1.00 packs/day for 20 years    Types: Cigarettes  . Smokeless tobacco: Never Used  . 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    Review of Systems ROS reviewed and all otherwise negative except for mentioned in HPI  Allergies  Demerol; Zocor; Other; and Penicillins  Home Medications   Current Outpatient Rx  Name  Route  Sig  Dispense  Refill  . acetaminophen (TYLENOL) 500 MG tablet   Oral   Take 500 mg by mouth every 6 (six) hours as needed for moderate pain.         . ARIPiprazole (ABILIFY) 5 MG tablet   Oral   Take 1 tablet (5 mg total) by mouth 2 (two) times daily at 8 am and 10 pm. For depression and anxiety.   60 tablet   0   . FLUoxetine (PROZAC) 40 MG capsule   Oral   Take 1 capsule (40 mg total) by mouth daily. For depression.   30 capsule   0   . ibuprofen (ADVIL,MOTRIN) 200 MG tablet   Oral   Take  200 mg by mouth every 6 (six) hours as needed for mild pain.         [name] lisinopril (PRINIVIL,ZESTRIL) 20 MG tablet   Oral   Take 1 tablet (20 mg total) by mouth daily. For high blood pressure   30 tablet   0   . tamsulosin (FLOMAX) 0.4 MG CAPS capsule   Oral   Take 1 capsule (0.4 mg total) by mouth daily after breakfast. For BPH.   30 capsule   0    BP 153/94  Pulse 87  Temp(Src) 98.2 F (36.8 C) (Oral)  Resp 16  SpO2 94% Vitals reviewed Physical Exam Physical Examination: General appearance - alert, well appearing, and in no distress Mental status - alert, oriented to person, place, and time Chest - normal respiratory effort Skin - normal coloration and turgor, no rashes Psych- calm, flat affect  ED Course  Procedures (including critical care time) Labs Review Labs Reviewed - No data to display Imaging Review No results found.  EKG Interpretation   None       MDM   1. Suicidal ideation    Pt had been previously medically cleared at [facility] ED, he  was transferred today from [facility] for evaluation by TTS/Psychiatry to determine disposition.  Please refer to notes from [facility] for complete details.  Pt continues to state he is suicidal.  There is concern that he may be being manipulative due to homelessness.  Pt will be assess by TTS/psychiatry.      Ethelda Chick, MD 01/23/13 2227

## 2013-01-23 NOTE — ED Notes (Signed)
Per Ward, MD, not discharging patient until there is a note in [facility] showing that patient is not a threat to himself.   Advised her that patient wanted to speak with her about his situation.   Called social work to come and speak with patient to offer up resources.

## 2013-01-23 NOTE — ED Notes (Signed)
Patient comes in through the PSY transport. The patient continues to not be able to go home. Needs a Psy eval

## 2013-01-23 NOTE — ED Notes (Signed)
Social work spoke with patient who states he will kill himself if he leaves.   Social work going to talk with Dr. Elesa Massed about release.

## 2013-01-23 NOTE — ED Notes (Signed)
Called [facility] to verify when patient will have his TTS done, Shemeyer stated they will add patient to the list.

## 2013-01-23 NOTE — Progress Notes (Signed)
Patient Discharge Instructions:  After Visit Summary (AVS):   Faxed to:  01/23/13 Discharge Summary Note:   Faxed to:  01/23/13 Psychiatric Admission Assessment Note:   Faxed to:  01/23/13 Suicide Risk Assessment - Discharge Assessment:   Faxed to:  01/23/13 Faxed/Sent to the Next Level Care provider:  01/23/13 Faxed to [facility] @ [phone]  Jerelene Redden, 01/23/2013, 2:31 PM

## 2013-01-23 NOTE — ED Notes (Signed)
Dr. Elesa Massed calling [facility] about patient.  Discharge on hold at this time.

## 2013-01-23 NOTE — Progress Notes (Signed)
CSW was contacted by nursing to talk to this 62 year old male to give homeless resources.  CSW met with patient and homeless shelter resources provided. During the visit- patient verbalized that he had presented to the emergency room with suicidal ideations. He related that he has had multiple past attempts and was recently released from [facility] with planned follow-up with  [facility].  Patient states that he has been staying at the [facility] and he also has a car but he does not want to go there.  CSW contacted area shelters but they are all full.  Patient states that he has met with the Psychiatrist via telepsych service today; per discussion with patient's nurse and review of the chart- patient has been cleared for d/c.  During our visit- patient related that he continues to have suicidal thoughts and that he does not want to leave today. "Why can't I stay until tomorrow?"  When asked if he had a plan- patient was somewhat vague but then replied  "Well- I have a razor blade in my car and would use that."  (Patient reports that past attempts to harm himself have involved putting a plastic bag over his head but he always removes it when he 'gets too hot.'" )  CSW spoke with Dr. Elesa Massed, ED Physician and she was notified of above information and will talk further with the Behavioral Health Liason for follow up to see if patient could meet criteria for inpatient admission. If not- she will proceed with discharge.  Patient is aware of this.  The Mobile Crisis number and 911 will be placed on his AVS by nursing for him to call should he decide he wants to harm himself or others.  Patient talked about how he hates the holidays and the cold weather; he states he has no family in the state and family that is out of state are not involved in his care. Patient was able to talk about his past misfortunes and how he does not feel he has anything to live for.  He states that he has planned to kill himself for an  extended amount of time once his money ran out  (he was living on a 401K).  Discussed with patient the benefits of following up with the [facility] as they would be able to provide many additional resources and support.  He stated that he would consider contacting them.  No further CSW needs identified. CSW signing off.  Lorri Frederick. [place] Pugh  [phone]

## 2013-01-24 NOTE — ED Notes (Signed)
Patient denies SI, Hi and AVH at the time of discharge. No acute distress noted.

## 2013-01-24 NOTE — ED Notes (Signed)
Tele assessment done 

## 2013-01-24 NOTE — ED Provider Notes (Signed)
TTS evaluated the patient. He was just discharged from behavioral health for the same symptoms. He has declined most outpatient resources for what appeared to be inappropriate reasons. Today he is agreeable to followup with shelters. Is not actively suicidal. No self injury. Will discharge from emergency department. TTS provided outpatient resources and followup instructions.  Results for orders placed during the hospital encounter of 01/21/13  ACETAMINOPHEN LEVEL      Result Value Range   Acetaminophen (Tylenol), Serum <15.0  10 - 30 ug/mL  CBC      Result Value Range   WBC 13.7 (*) 4.0 - 10.5 K/uL   RBC 5.34  4.22 - 5.81 MIL/uL   Hemoglobin 16.7  13.0 - 17.0 g/dL   HCT 40.9  81.1 - 91.4 %   MCV 90.1  78.0 - 100.0 fL   MCH 31.3  26.0 - 34.0 pg   MCHC 34.7  30.0 - 36.0 g/dL   RDW 78.2  95.6 - 21.3 %   Platelets 267  150 - 400 K/uL  COMPREHENSIVE METABOLIC PANEL      Result Value Range   Sodium 137  135 - 145 mEq/L   Potassium 4.4  3.5 - 5.1 mEq/L   Chloride 101  96 - 112 mEq/L   CO2 26  19 - 32 mEq/L   Glucose, Bld 112 (*) 70 - 99 mg/dL   BUN 20  6 - 23 mg/dL   Creatinine, Ser 0.86  0.50 - 1.35 mg/dL   Calcium 9.0  8.4 - 57.8 mg/dL   Total Protein 7.3  6.0 - 8.3 g/dL   Albumin 3.5  3.5 - 5.2 g/dL   AST 20  0 - 37 U/L   ALT 19  0 - 53 U/L   Alkaline Phosphatase 101  39 - 117 U/L   Total Bilirubin 0.2 (*) 0.3 - 1.2 mg/dL   GFR calc non Af Amer 76 (*) >90 mL/min   GFR calc Af Amer 88 (*) >90 mL/min  ETHANOL      Result Value Range   Alcohol, Ethyl (B) <11  0 - 11 mg/dL  SALICYLATE LEVEL      Result Value Range   Salicylate Lvl <2.0 (*) 2.8 - 20.0 mg/dL  URINE RAPID DRUG SCREEN (HOSP PERFORMED)      Result Value Range   Opiates NONE DETECTED  NONE DETECTED   Cocaine NONE DETECTED  NONE DETECTED   Benzodiazepines NONE DETECTED  NONE DETECTED   Amphetamines NONE DETECTED  NONE DETECTED   Tetrahydrocannabinol NONE DETECTED  NONE DETECTED   Barbiturates NONE DETECTED  NONE  DETECTED       Sunnie Nielsen, MD 01/24/13 [phone]

## 2013-01-24 NOTE — ED Provider Notes (Signed)
Medical screening examination/treatment/procedure(s) were performed by non-physician practitioner and as supervising physician I was immediately available for consultation/collaboration.  EKG Interpretation   None        Tanny Harnack R. Sergey Ishler, MD 01/24/13 1058 

## 2013-01-24 NOTE — BHH Counselor (Signed)
CSW was contacted by nursing to talk to this 62 year old male to give homeless resources. CSW met with patient and homeless shelter resources provided. During the visit- patient verbalized that he had presented to the emergency room with suicidal ideations. He related that he has had multiple past attempts and was recently released from [facility] with planned follow-up with [facility]. Patient states that he has been staying at the [facility] and he also has a car but he does not want to go there. CSW contacted area shelters but they are all full. Patient states that he has met with the Psychiatrist via telepsych service today; per discussion with patient's nurse and review of the chart- patient has been cleared for d/c. During our visit- patient related that he continues to have suicidal thoughts and that he does not want to leave today. "Why can't I stay until tomorrow?" When asked if he had a plan- patient was somewhat vague but then replied "Well- I have a razor blade in my car and would use that." (Patient reports that past attempts to harm himself have involved putting a plastic bag over his head but he always removes it when he 'gets too hot.'" ) CSW spoke with Dr. Elesa Massed, ED Physician and she was notified of above information and will talk further with the Behavioral Health Liason for follow up to see if patient could meet criteria for inpatient admission. If not- she will proceed with discharge. Patient is aware of this. The Mobile Crisis number and 911 will be placed on his AVS by nursing for him to call should he decide he wants to harm himself or others. Patient talked about how he hates the holidays and the cold weather; he states he has no family in the state and family that is out of state are not involved in his care. Patient was able to talk about his past misfortunes and how he does not feel he has anything to live for. He states that he has planned to kill himself for an extended  amount of time once his money ran out (he was living on a 401K). Discussed with patient the benefits of following up with the [facility]) as they would be able to provide many additional resources and support. He stated that he would consider contacting them. No further CSW needs identified. CSW signing off. Lorri Frederick. [place] P[place] [phone]    01/24/13 0345am  Pls see previous note. After d/c from [facility], pt presented to [facility] with on-going SI, told this this that he had a razor in his vehicle that he could use to harm himself.  Pt told this writer--"I've been in this 'I give up' for months now" and I don't know what to do".  Pt says that he attempted to harm self 3x's in the past all by suffocation but when he put the bag over his head it was too hot so placed a cold towel on his face and then placed the bag back over his head.  This Clinical research associate inquired about pt.'s threat to harm self after 401k was depleted, pt told this Clinical research associate funds were depleted 2 mos ago.  This says he visited [facility], states he has not been residing with this facility.  Pt says he doesn't want to go to this facility because "it looks a little run down".  This Clinical research associate discussed disposition with Dr. Dierdre Highman, this writer recommends d/c and follow up with referrals provided to pt  by [facility] and CSW

## 2013-01-28 NOTE — Consult Note (Signed)
Agree with plan 

## 2013-01-31 ENCOUNTER — Ambulatory Visit: Payer: Self-pay

## 2013-03-22 ENCOUNTER — Encounter (HOSPITAL_COMMUNITY): Payer: Self-pay | Admitting: Emergency Medicine

## 2013-03-22 ENCOUNTER — Emergency Department (HOSPITAL_COMMUNITY): Payer: Non-veteran care

## 2013-03-22 ENCOUNTER — Emergency Department (HOSPITAL_COMMUNITY)
Admission: EM | Admit: 2013-03-22 | Discharge: 2013-03-22 | Disposition: A | Discharge status: Home / Self Care | Payer: Non-veteran care | Attending: Emergency Medicine | Admitting: Emergency Medicine

## 2013-03-22 DIAGNOSIS — J441 Chronic obstructive pulmonary disease with (acute) exacerbation: Secondary | ICD-10-CM

## 2013-03-22 DIAGNOSIS — F411 Generalized anxiety disorder: Secondary | ICD-10-CM | POA: Insufficient documentation

## 2013-03-22 DIAGNOSIS — Z880 Allergy status to penicillin: Secondary | ICD-10-CM | POA: Insufficient documentation

## 2013-03-22 DIAGNOSIS — I10 Essential (primary) hypertension: Secondary | ICD-10-CM | POA: Insufficient documentation

## 2013-03-22 DIAGNOSIS — F17200 Nicotine dependence, unspecified, uncomplicated: Secondary | ICD-10-CM | POA: Insufficient documentation

## 2013-03-22 DIAGNOSIS — F329 Major depressive disorder, single episode, unspecified: Secondary | ICD-10-CM | POA: Insufficient documentation

## 2013-03-22 DIAGNOSIS — Z79899 Other long term (current) drug therapy: Secondary | ICD-10-CM | POA: Insufficient documentation

## 2013-03-22 DIAGNOSIS — F3289 Other specified depressive episodes: Secondary | ICD-10-CM | POA: Insufficient documentation

## 2013-03-22 LAB — BASIC METABOLIC PANEL
BUN: 15 mg/dL (ref 6–23)
CO2: 23 mEq/L (ref 19–32)
Calcium: 8.6 mg/dL (ref 8.4–10.5)
Chloride: 101 mEq/L (ref 96–112)
Creatinine, Ser: 0.78 mg/dL (ref 0.50–1.35)
GFR calc Af Amer: >90 mL/min (ref >90)
GFR calc non Af Amer: >90 mL/min (ref >90)
Glucose, Bld: 84 mg/dL (ref 70–99)
Potassium: 4.4 mEq/L (ref 3.7–5.3)
Sodium: 136 mEq/L — ABNORMAL LOW (ref 137–147)

## 2013-03-22 LAB — CBC
HCT: 44.8 % (ref 39.0–52.0)
Hemoglobin: 15.1 g/dL (ref 13.0–17.0)
MCH: 31.2 pg (ref 26.0–34.0)
MCHC: 33.7 g/dL (ref 30.0–36.0)
MCV: 92.6 fL (ref 78.0–100.0)
Platelets: 269 10*3/uL (ref 150–400)
RBC: 4.84 MIL/uL (ref 4.22–5.81)
RDW: 14.2 % (ref 11.5–15.5)
WBC: 9.2 10*3/uL (ref 4.0–10.5)

## 2013-03-22 LAB — POCT I-STAT TROPONIN I: Troponin i, poc: 0 ng/mL (ref 0.00–0.08)

## 2013-03-22 LAB — PRO B NATRIURETIC PEPTIDE: Pro B Natriuretic peptide (BNP): 75.4 pg/mL (ref 0–125)

## 2013-03-22 MED ORDER — PREDNISONE 20 MG PO TABS
60.0000 mg | ORAL_TABLET | Freq: Once | ORAL | Status: AC
  Administered 2013-03-22: 60 mg via ORAL
  Filled 2013-03-22: qty 3

## 2013-03-22 MED ORDER — ALBUTEROL SULFATE (2.5 MG/3ML) 0.083% IN NEBU
5.0000 mg | INHALATION_SOLUTION | Freq: Once | RESPIRATORY_TRACT | Status: AC
  Administered 2013-03-22: 5 mg via RESPIRATORY_TRACT
  Filled 2013-03-22: qty 6

## 2013-03-22 MED ORDER — PREDNISONE 20 MG PO TABS: ORAL_TABLET | ORAL | Status: DC

## 2013-03-22 MED ORDER — ALBUTEROL SULFATE HFA 108 (90 BASE) MCG/ACT IN AERS
2.0000 | INHALATION_SPRAY | Freq: Once | RESPIRATORY_TRACT | Status: AC
  Administered 2013-03-22: 2 via RESPIRATORY_TRACT
  Filled 2013-03-22: qty 6.7

## 2013-03-22 MED ORDER — IPRATROPIUM BROMIDE 0.02 % IN SOLN
0.5000 mg | Freq: Once | RESPIRATORY_TRACT | Status: AC
  Administered 2013-03-22: 0.5 mg via RESPIRATORY_TRACT
  Filled 2013-03-22: qty 2.5

## 2013-03-22 NOTE — ED Provider Notes (Signed)
CSN: [id number]     Arrival date & time 03/22/13  1608 History   First MD Initiated Contact with Patient 03/22/13 1657     Chief Complaint  Patient presents with  . Shortness of Breath   (Consider location/radiation/quality/duration/timing/severity/associated sxs/prior Treatment) HPI Comments: Patient is a 63 year old male with a past medical history hypertension, bronchitis, anxiety and depression who presents to the emergency department complaining of shortness of breath x2 days. Patient states he is short of breath both at rest and on exertion, worse with laying flat. Patient states last night when he is laying in bed, he felt like he was "suffocating suffocating and could not catch my breath when laying flat". Admits to associated nonproductive cough that has been present for a couple weeks. States his chest feels tight. Denies chest pain, nausea, vomiting, diaphoresis, fever or chills.  Patient is a 63 y.o. male presenting with shortness of breath. The history is provided by the patient.  Shortness of Breath Associated symptoms: cough     Past Medical History  Diagnosis Date  . Hypertension   . Bronchitis   . Anxiety   . Mental disorder   . Depression   . Suicide attempt    Past Surgical History  Procedure Laterality Date  . Skin cancer excision    . Cryptorchidism     Family History  Problem Relation Age of Onset  . Dementia Father    History  Substance Use Topics  . Smoking status: Current Every Day Smoker -- 0.50 packs/day for 20 years    Types: Cigarettes  . Smokeless tobacco: Never Used  . 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    Review of Systems  Respiratory: Positive for cough and shortness of breath.   All other systems reviewed and are negative.    Allergies  Demerol; Zocor; Other; and Penicillins  Home Medications   Current Outpatient Rx  Name  Route  Sig  Dispense  Refill  . acetaminophen  (TYLENOL) 500 MG tablet   Oral   Take 500 mg by mouth every 6 (six) hours as needed for moderate pain.         . ARIPiprazole (ABILIFY) 5 MG tablet   Oral   Take 1 tablet (5 mg total) by mouth 2 (two) times daily at 8 am and 10 pm. For depression and anxiety.   60 tablet   0   . FLUoxetine (PROZAC) 40 MG capsule   Oral   Take 1 capsule (40 mg total) by mouth daily. For depression.   30 capsule   0   . ibuprofen (ADVIL,MOTRIN) 200 MG tablet   Oral   Take 200 mg by mouth every 6 (six) hours as needed for mild pain.         [name] lisinopril (PRINIVIL,ZESTRIL) 20 MG tablet   Oral   Take 1 tablet (20 mg total) by mouth daily. For high blood pressure   30 tablet   0   . tamsulosin (FLOMAX) 0.4 MG CAPS capsule   Oral   Take 1 capsule (0.4 mg total) by mouth daily after breakfast. For BPH.   30 capsule   0    BP 131/79  Pulse 81  Temp(Src) 98.2 F (36.8 C) (Oral)  Resp 16  SpO2 96% Physical Exam  Nursing note and vitals reviewed. Constitutional: He is oriented to person, place, and time. He appears well-developed and well-nourished. No  distress.  HENT:  Head: Normocephalic and atraumatic.  Mouth/Throat: Oropharynx is clear and moist.  Eyes: Conjunctivae are normal.  Neck: Normal range of motion. Neck supple.  Cardiovascular: Normal rate, regular rhythm, normal heart sounds and intact distal pulses.   No extremity edema.  Pulmonary/Chest: Effort normal. No respiratory distress.  Poor air movement, scattered inspiratory and expiratory wheezes bilateral.  Abdominal: Soft. Normal appearance and bowel sounds are normal. He exhibits no distension. There is no tenderness.  Musculoskeletal: Normal range of motion. He exhibits no edema.  Neurological: He is alert and oriented to person, place, and time.  Skin: Skin is warm and dry. He is not diaphoretic.  Psychiatric: He has a normal mood and affect. His behavior is normal.    ED Course  Procedures (including critical care  time) Labs Review Labs Reviewed  BASIC METABOLIC PANEL - Abnormal; Notable for the following:    Sodium 136 (*)    All other components within normal limits  CBC  PRO B NATRIURETIC PEPTIDE  POCT I-STAT TROPONIN I   Imaging Review Dg Chest 2 View  03/22/2013   CLINICAL DATA:  Shortness of breath.  EXAM: CHEST  2 VIEW  COMPARISON:  Two-view chest 03/07/2013.  FINDINGS: The heart size is normal. There is no edema or effusion to suggest failure. Emphysematous changes and chronic interstitial coarsening are stable. No focal airspace disease is evident. The visualized soft tissues and bony thorax are unremarkable.  IMPRESSION: 1. Emphysema. 2. No acute cardiopulmonary disease.   Electronically Signed   By: Lawrence Santiago M.D.   On: 03/22/2013 18:03    EKG Interpretation    Date/Time:  Friday March 22 2013 17:01:02 EST Ventricular Rate:  88 PR Interval:  166 QRS Duration: 100 QT Interval:  385 QTC Calculation: 466 R Axis:   35 Text Interpretation:  Atrial-paced complexes and normal sinus rhythm Abnormal R-wave progression, early transition No significant change since last tracing Confirmed by YAO  MD, DAVID [phone]) on 03/22/2013 5:18:51 PM            MDM   1. COPD exacerbation     Patient presenting with shortness of breath and cough, orthopnea. He is well appearing and in no apparent distress, normal vital signs, O2 sat 96% on room air. On exam, there is scattered inspiratory and expiratory wheezes. Labs pending- CBC, BMP, BNP, troponin. EKG and chest x-ray pending. Will give DuoNeb. 6:28 PM Labs normal, CXR showing emphysema, no no acute process. EKG unchanged. Pt reports improvement in symptoms after DuoNeb treatment. Repeat exam, lungs still with scattered wheezes, however marked clinical improvement noted. O2 sat remains stable. Will put on short course of steroids, albuterol inhaler. He is stable for discharge. F/u with PCP at the [place]. Return precautions given. Patient states  understanding of treatment care plan and is agreeable.  Illene Labrador, PA-C 03/22/13 [phone]

## 2013-03-22 NOTE — ED Provider Notes (Signed)
Medical screening examination/treatment/procedure(s) were performed by non-physician practitioner and as supervising physician I was immediately available for consultation/collaboration.  EKG Interpretation    Date/Time:  Friday March 22 2013 17:01:02 EST Ventricular Rate:  88 PR Interval:  166 QRS Duration: 100 QT Interval:  385 QTC Calculation: 466 R Axis:   35 Text Interpretation:  Atrial-paced complexes and normal sinus rhythm Abnormal R-wave progression, early transition No significant change since last tracing Confirmed by Ailee Pates  MD, Damonie Furney [phone]) on 03/22/2013 5:18:51 PM              Wandra Arthurs, MD 03/22/13 2317

## 2013-03-22 NOTE — Discharge Instructions (Signed)
Use inhaler every 4-6 hours as needed for shortness of breath. Take prednisone as prescribed beginning tomorrow as you were given the persistence in the emergency department today. Followup with your primary care physician.  Chronic Obstructive Pulmonary Disease Exacerbation Chronic obstructive pulmonary disease (COPD) is a common lung condition in which airflow from the lungs is limited. COPD is a general term that can be used to describe many different lung problems that limit airflow, including chronic bronchitis and emphysema. COPD exacerbations are episodes when breathing symptoms become much worse and require extra treatment. Without treatment, COPD exacerbations can be life threatening, and frequent COPD exacerbations can cause further damage to your lungs. CAUSES   Respiratory infections.   Exposure to smoke.   Exposure to air pollution, chemical fumes, or dust. Sometimes there is no apparent cause or trigger. RISK FACTORS  Smoking cigarettes.  Older age.  Frequent prior COPD exacerbations. SIGNS AND SYMPTOMS   Increased coughing.   Increased thick spit (sputum) production.   Increased wheezing.   Increased shortness of breath.   Rapid breathing.   Chest tightness. DIAGNOSIS  Your medical history, a physical exam, and tests will help your health care provider make a diagnosis. Tests may include:  A chest X-ray.  Basic lab tests.  Sputum testing.  An arterial blood gas test. TREATMENT  Depending on the severity of your COPD exacerbation, you may need to be admitted to a hospital for treatment. Some of the treatments commonly used to treat COPD exacerbations are:   Antibiotic medicines.   Bronchodilators. These are drugs that expand the air passages. They may be given with an inhaler or nebulizer. Spacer devices may be needed to help improve drug delivery.  Corticosteroid medicines.  Supplemental oxygen therapy.  HOME CARE INSTRUCTIONS   Do not  smoke. Quitting smoking is very important to prevent COPD from getting worse and exacerbations from happening as often.  Avoid exposure to all substances that irritate the airway, especially to tobacco smoke.   If prescribed, take your antibiotics as directed. Finish them even if you start to feel better.  Only take over-the-counter or prescription medicines as directed by your health care provider.It is important to use correct technique with inhaled medicines.  Drink enough fluids to keep your urine clear or pale yellow (unless you have a medical condition that requires fluid restriction).  Use a cool mist vaporizer. This makes it easier to clear your chest when you cough.   If you have a home nebulizer and oxygen, continue to use them as directed.   Maintain all necessary vaccinations to prevent infections.   Exercise regularly.   Eat a healthy diet.   Keep all follow-up appointments as directed by your health care provider. SEEK IMMEDIATE MEDICAL CARE IF:  You have worsening shortness of breath.   You have trouble talking.   You have severe chest pain.  You have blood in your sputum.  You have a fever.  You have weakness, vomit repeatedly, or faint.   You feel confused.   You continue to get worse. MAKE SURE YOU:   Understand these instructions.  Will watch your condition.  Will get help right away if you are not doing well or get worse. Document Released: 12/12/2006 Document Revised: 12/05/2012 Document Reviewed: 10/19/2012 [facility] Patient Information 2014 [place].

## 2013-03-22 NOTE — ED Notes (Signed)
Pt reports SOB x 2 days, worst at night when lying down. Pt states "felt like I was suffocating and couldn't catch my breath when lying down, so I had to sit up." Pt states has had non-productive cough for a couple weeks. Denies any pain. Denies fever.

## 2013-03-22 NOTE — Progress Notes (Signed)
   CARE MANAGEMENT ED NOTE 03/22/2013  Patient:  Mario [name],Mario [name]   Account Number:  [id number]  Date Initiated:  03/22/2013  Documentation initiated by:  Livia Snellen  Subjective/Objective Assessment:   Patient presents to Ed with shortness of breath and cough.     Subjective/Objective Assessment Detail:     Action/Plan:   Action/Plan Detail:   Patient dischrged to home with steroids and inhaler, to follow up with pcp.   Anticipated DC Date:  03/22/2013     Status Recommendation to Physician:   Result of Recommendation:    Other ED [place]  Other  PCP issues    Choice offered to / List presented to:            Status of service:  Completed, signed off  ED Comments:   ED Comments Detail:  Patient confirms his pcp is Dr. Ihor Gully at the [facility] outpatient clinic in [facility].  System updated.

## 2013-05-18 ENCOUNTER — Emergency Department (HOSPITAL_COMMUNITY)
Admission: EM | Admit: 2013-05-18 | Discharge: 2013-05-18 | Disposition: A | Discharge status: Home / Self Care | Payer: Non-veteran care | Attending: Emergency Medicine | Admitting: Emergency Medicine

## 2013-05-18 ENCOUNTER — Emergency Department (HOSPITAL_COMMUNITY): Payer: Non-veteran care

## 2013-05-18 ENCOUNTER — Encounter (HOSPITAL_COMMUNITY): Payer: Self-pay | Admitting: Emergency Medicine

## 2013-05-18 DIAGNOSIS — R059 Cough, unspecified: Secondary | ICD-10-CM

## 2013-05-18 DIAGNOSIS — R079 Chest pain, unspecified: Secondary | ICD-10-CM

## 2013-05-18 DIAGNOSIS — F3289 Other specified depressive episodes: Secondary | ICD-10-CM | POA: Insufficient documentation

## 2013-05-18 DIAGNOSIS — F329 Major depressive disorder, single episode, unspecified: Secondary | ICD-10-CM | POA: Insufficient documentation

## 2013-05-18 DIAGNOSIS — Z79899 Other long term (current) drug therapy: Secondary | ICD-10-CM | POA: Insufficient documentation

## 2013-05-18 DIAGNOSIS — F17200 Nicotine dependence, unspecified, uncomplicated: Secondary | ICD-10-CM | POA: Insufficient documentation

## 2013-05-18 DIAGNOSIS — J441 Chronic obstructive pulmonary disease with (acute) exacerbation: Secondary | ICD-10-CM | POA: Insufficient documentation

## 2013-05-18 DIAGNOSIS — R05 Cough: Secondary | ICD-10-CM

## 2013-05-18 DIAGNOSIS — Z7982 Long term (current) use of aspirin: Secondary | ICD-10-CM | POA: Insufficient documentation

## 2013-05-18 DIAGNOSIS — I252 Old myocardial infarction: Secondary | ICD-10-CM | POA: Insufficient documentation

## 2013-05-18 DIAGNOSIS — I10 Essential (primary) hypertension: Secondary | ICD-10-CM | POA: Insufficient documentation

## 2013-05-18 DIAGNOSIS — F411 Generalized anxiety disorder: Secondary | ICD-10-CM | POA: Insufficient documentation

## 2013-05-18 HISTORY — DX: Acute myocardial infarction, unspecified: I21.9

## 2013-05-18 LAB — CBC
HCT: 44.7 % (ref 39.0–52.0)
Hemoglobin: 15.6 g/dL (ref 13.0–17.0)
MCH: 33.1 pg (ref 26.0–34.0)
MCHC: 34.9 g/dL (ref 30.0–36.0)
MCV: 94.7 fL (ref 78.0–100.0)
Platelets: 268 10*3/uL (ref 150–400)
RBC: 4.72 MIL/uL (ref 4.22–5.81)
RDW: 13.9 % (ref 11.5–15.5)
WBC: 12.2 10*3/uL — ABNORMAL HIGH (ref 4.0–10.5)

## 2013-05-18 LAB — BASIC METABOLIC PANEL
BUN: 15 mg/dL (ref 6–23)
CO2: 22 mEq/L (ref 19–32)
Calcium: 8.7 mg/dL (ref 8.4–10.5)
Chloride: 102 mEq/L (ref 96–112)
Creatinine, Ser: 0.79 mg/dL (ref 0.50–1.35)
GFR calc Af Amer: >90 mL/min (ref >90)
GFR calc non Af Amer: >90 mL/min (ref >90)
Glucose, Bld: 96 mg/dL (ref 70–99)
Potassium: 4.5 mEq/L (ref 3.7–5.3)
Sodium: 137 mEq/L (ref 137–147)

## 2013-05-18 LAB — PRO B NATRIURETIC PEPTIDE: Pro B Natriuretic peptide (BNP): 26.1 pg/mL (ref 0–125)

## 2013-05-18 LAB — I-STAT TROPONIN, ED: Troponin i, poc: 0 ng/mL (ref 0.00–0.08)

## 2013-05-18 MED ORDER — ALBUTEROL SULFATE HFA 108 (90 BASE) MCG/ACT IN AERS
2.0000 | INHALATION_SPRAY | Freq: Once | RESPIRATORY_TRACT | Status: AC
  Administered 2013-05-18: 2 via RESPIRATORY_TRACT
  Filled 2013-05-18: qty 6.7

## 2013-05-18 MED ORDER — ALBUTEROL (5 MG/ML) CONTINUOUS INHALATION SOLN
5.0000 mg/h | INHALATION_SOLUTION | Freq: Once | RESPIRATORY_TRACT | Status: AC
  Administered 2013-05-18: 5 mg/h via RESPIRATORY_TRACT
  Filled 2013-05-18: qty 20

## 2013-05-18 MED ORDER — ALBUTEROL SULFATE (2.5 MG/3ML) 0.083% IN NEBU
2.5000 mg | INHALATION_SOLUTION | Freq: Once | RESPIRATORY_TRACT | Status: AC
  Administered 2013-05-18: 2.5 mg via RESPIRATORY_TRACT
  Filled 2013-05-18: qty 3

## 2013-05-18 MED ORDER — KETOROLAC TROMETHAMINE 30 MG/ML IJ SOLN
30.0000 mg | Freq: Once | INTRAMUSCULAR | Status: AC
  Administered 2013-05-18: 30 mg via INTRAVENOUS
  Filled 2013-05-18: qty 1

## 2013-05-18 MED ORDER — PREDNISONE 10 MG PO TABS: ORAL_TABLET | ORAL | Status: DC

## 2013-05-18 MED ORDER — AEROCHAMBER PLUS W/MASK MISC
Status: AC
  Administered 2013-05-18: 16:00:00
  Filled 2013-05-18: qty 1

## 2013-05-18 MED ORDER — PREDNISONE 20 MG PO TABS
60.0000 mg | ORAL_TABLET | Freq: Once | ORAL | Status: AC
  Administered 2013-05-18: 60 mg via ORAL
  Filled 2013-05-18: qty 3

## 2013-05-18 NOTE — Discharge Instructions (Signed)
Chronic Obstructive Pulmonary Disease  Chronic obstructive pulmonary disease (COPD) is a common lung condition in which airflow from the lungs is limited. COPD is a general term that can be used to describe many different lung problems that limit airflow, including both chronic bronchitis and emphysema.  If you have COPD, your lung function will probably never return to normal, but there are measures you can take to improve lung function and make yourself feel better.   CAUSES   · Smoking (common).    · Exposure to secondhand smoke.    · Genetic problems.  · Chronic inflammatory lung diseases or recurrent infections.  SYMPTOMS   · Shortness of breath, especially with physical activity.    · Deep, persistent (chronic) cough with a large amount of thick mucus.    · Wheezing.    · Rapid breaths (tachypnea).    · Gray or bluish discoloration (cyanosis) of the skin, especially in fingers, toes, or lips.    · Fatigue.    · Weight loss.    · Frequent infections or episodes when breathing symptoms become much worse (exacerbations).    · Chest tightness.  DIAGNOSIS   Your healthcare provider will take a medical history and perform a physical examination to make the initial diagnosis.  Additional tests for COPD may include:   · Lung (pulmonary) function tests.  · Chest X-ray.  · CT scan.  · Blood tests.  TREATMENT   Treatment available to help you feel better when you have COPD include:   · Inhaler and nebulizer medicines. These help manage the symptoms of COPD and make your breathing more comfortable  · Supplemental oxygen. Supplemental oxygen is only helpful if you have a low oxygen level in your blood.    · Exercise and physical activity. These are beneficial for nearly all people with COPD. Some people may also benefit from a pulmonary rehabilitation program.  HOME CARE INSTRUCTIONS   · Take all medicines (inhaled or pills) as directed by your health care provider.  · Only take over-the-counter or prescription medicines  for pain, fever, or discomfort as directed by your health care provider.    · Avoid over-the-counter medicines or cough syrups that dry up your airway (such as antihistamines) and slow down the elimination of secretions unless instructed otherwise by your healthcare provider.    · If you are a smoker, the most important thing that you can do is stop smoking. Continuing to smoke will cause further lung damage and breathing trouble. Ask your health care provider for help with quitting smoking. He or she can direct you to community resources or hospitals that provide support.  · Avoid exposure to irritants such as smoke, chemicals, and fumes that aggravate your breathing.  · Use oxygen therapy and pulmonary rehabilitation if directed by your health care provider. If you require home oxygen therapy, ask your healthcare provider whether you should purchase a pulse oximeter to measure your oxygen level at home.    · Avoid contact with individuals who have a contagious illness.  · Avoid extreme temperature and humidity changes.  · Eat healthy foods. Eating smaller, more frequent meals and resting before meals may help you maintain your strength.  · Stay active, but balance activity with periods of rest. Exercise and physical activity will help you maintain your ability to do things you want to do.  · Preventing infection and hospitalization is very important when you have COPD. Make sure to receive all the vaccines your health care provider recommends, especially the pneumococcal and influenza vaccines. Ask your healthcare provider whether you   in (inhaling) through your nose for 1 second. Then, purse your lips as if you were going to whistle and breathe out (exhale)  through the pursed lips for 2 seconds.   Diaphragmatic breathing. Start by putting one hand on your abdomen just above your waist. Inhale slowly through your nose. The hand on your abdomen should move out. Then purse your lips and exhale slowly. You should be able to feel the hand on your abdomen moving in as you exhale.   Learn and use controlled coughing to clear mucus from your lungs. Controlled coughing is a series of short, progressive coughs. The steps of controlled coughing are:  1. Lean your head slightly forward.  2. Breathe in deeply using diaphragmatic breathing.  3. Try to hold your breath for 3 seconds.  4. Keep your mouth slightly open while coughing twice.  5. Spit any mucus out into a tissue.  6. Rest and repeat the steps once or twice as needed. SEEK MEDICAL CARE IF:   You are coughing up more mucus than usual.   There is a change in the color or thickness of your mucus.   Your breathing is more labored than usual.   Your breathing is faster than usual.  SEEK IMMEDIATE MEDICAL CARE IF:   You have shortness of breath while you are resting.   You have shortness of breath that prevents you from:  Being able to talk.   Performing your usual physical activities.   You have chest pain lasting longer than 5 minutes.   Your skin color is more cyanotic than usual.  You measure low oxygen saturations for longer than 5 minutes with a pulse oximeter. MAKE SURE YOU:   Understand these instructions.  Will watch your condition.  Will get help right away if you are not doing well or get worse. Document Released: 11/24/2004 Document Revised: 12/05/2012 Document Reviewed: 10/11/2012 [facility] Patient Information 2014 [place], [place].  Use albuterol inhaler every 4-6 hours as needed Take prednisone as prescribed Return if symptoms worsen, chest pain or increasing shortness of breath

## 2013-05-18 NOTE — ED Notes (Signed)
Headache relieved, patient states "I am feeling better."

## 2013-05-18 NOTE — ED Provider Notes (Signed)
CSN: [id number]     Arrival date & time 05/18/13  1044 History   First MD Initiated Contact with Patient 05/18/13 1115     Chief Complaint  Patient presents with  . Chest Pain     (Consider location/radiation/quality/duration/timing/severity/associated sxs/prior Treatment) Patient is a 63 y.o. male presenting with chest pain. The history is provided by the patient. No language interpreter was used.  Chest Pain Pain location:  L chest and R lateral chest Pain quality: stabbing   Pain radiates to:  Does not radiate Pain radiates to the back: no   Pain severity:  Mild Duration:  6 hours Timing:  Constant Progression:  Improving Relieved by:  Nothing Ineffective treatments:  Aspirin and nitroglycerin Associated symptoms: cough   Associated symptoms: no abdominal pain, no diaphoresis, no dizziness, no fever, no nausea, no palpitations and not vomiting    62y.o caucasian male pt with hx of MI, HTN, COPD, Anxiety/Depression was brought into ER by EMS with chest pain starting at 6am.  The pain is sharp in character, non-radiating, and was constant for 5 hours about 5/10.  Pain unrelieved by 4 ASA and 1 NTG.  Deep breathing, coughing and palpation makes pain worse.  Pt denies associated N/V, SOB, and dizziness.  States he had negative stress test 4 months ago.  Pt also reports R costal border soreness starting around the same time.  Pain is also made worse with coughing and deep breathing.  Pt has chronic cough and has noticed he has been coughing more this past week and needed to use albuterol inhaler twice this morning for mild relief.    Past Medical History  Diagnosis Date  . Hypertension   . Bronchitis   . Anxiety   . Mental disorder   . Depression   . Suicide attempt   . MI (myocardial infarction)    Past Surgical History  Procedure Laterality Date  . Skin cancer excision    . Cryptorchidism     Family History  Problem Relation Age of Onset  . Dementia Father    History   Substance Use Topics  . Smoking status: Current Every Day Smoker -- 0.50 packs/day for 20 years    Types: Cigarettes  . Smokeless tobacco: Never Used  . Alcohol Use: No     Comment: denies use of any drugs or alcohol    Review of Systems  Constitutional: Negative for fever and diaphoresis.  Respiratory: Positive for cough.   Cardiovascular: Positive for chest pain. Negative for palpitations.  Gastrointestinal: Negative for nausea, vomiting, abdominal pain and diarrhea.  Genitourinary: Negative for dysuria.  Neurological: Negative for dizziness and light-headedness.      Allergies  Demerol and Zocor  Home Medications   Current Outpatient Rx  Name  Route  Sig  Dispense  Refill  . acetaminophen (TYLENOL) 500 MG tablet   Oral   Take 500 mg by mouth every 6 (six) hours as needed for moderate pain.         . ANTIPYRINE-BENZOCAINE OT   Both Ears   Place 1 drop into both ears daily as needed (ITCHING).         [name] ARIPiprazole (ABILIFY) 5 MG tablet   Oral   Take 1 tablet (5 mg total) by mouth 2 (two) times daily at 8 am and 10 pm. For depression and anxiety.   60 tablet   0   . aspirin 81 MG tablet   Oral   Take 81 mg by mouth  daily.         . DULoxetine (CYMBALTA) 30 MG capsule   Oral   Take 30 mg by mouth daily.         . flunisolide (NASALIDE) 25 MCG/ACT (0.025%) SOLN   Each Nare   Place 1 spray into both nostrils 2 (two) times daily as needed (STUFFY NOSE).         [name] gabapentin (NEURONTIN) 300 MG capsule   Oral   Take 300 mg by mouth 2 (two) times daily.         [name] guaiFENesin-dextromethorphan (ROBITUSSIN DM) 100-10 MG/5ML syrup   Oral   Take 5 mLs by mouth every 4 (four) hours as needed for cough.         [name] HYDROcodone-acetaminophen (NORCO/VICODIN) 5-325 MG per tablet   Oral   Take 1 tablet by mouth every 8 (eight) hours as needed for moderate pain.         [name] ibuprofen (ADVIL,MOTRIN) 200 MG tablet   Oral   Take 200 mg by mouth every 6  (six) hours as needed for mild pain.         . Ipratropium-Albuterol (COMBIVENT RESPIMAT) 20-100 MCG/ACT AERS respimat   Inhalation   Inhale 1 puff into the lungs every 6 (six) hours as needed for wheezing.         [name] lisinopril (PRINIVIL,ZESTRIL) 20 MG tablet   Oral   Take 1 tablet (20 mg total) by mouth daily. For high blood pressure   30 tablet   0   . Naphazoline HCl (CLEAR EYES OP)   Both Eyes   Place 1-2 drops into both eyes 2 (two) times daily as needed (DRY EYES).         . sodium chloride (OCEAN) 0.65 % SOLN nasal spray   Each Nare   Place 2 sprays into both nostrils 4 (four) times daily as needed for congestion.         . triamcinolone cream (KENALOG) 0.1 %   Topical   Apply 1 application topically daily as needed (ITCHING).         [name] predniSONE (DELTASONE) 10 MG tablet      Take 5 pills on day one, take 4 pills on day two, take 3 pills on day three, take 2 pills on day four, take 1 pill on day 5.   15 tablet   0    BP 109/58  Pulse 99  Temp(Src) 97.5 F (36.4 C) (Oral)  Resp 16  Ht 6\' 2"  (1.88 m)  Wt 284 lb (128.822 kg)  BMI 36.45 kg/m2  SpO2 100% Physical Exam  Constitutional: He is oriented to person, place, and time. He appears well-developed and well-nourished. No distress.  HENT:  Head: Normocephalic.  Eyes: Conjunctivae and EOM are normal. Pupils are equal, round, and reactive to light.  Cardiovascular: Normal rate, regular rhythm, normal heart sounds and intact distal pulses.  Exam reveals no gallop and no friction rub.   No murmur heard. Pulmonary/Chest: Effort normal. He has wheezes in the right upper field, the left upper field and the left middle field. He exhibits tenderness.  R costal margin, L chest tenderness to palpation  Abdominal: Soft. Bowel sounds are normal. He exhibits no distension.  Neurological: He is alert and oriented to person, place, and time.    ED Course  Procedures (including critical care time) Labs  Review Labs Reviewed  CBC - Abnormal; Notable for the following:    WBC 12.2 (*)  All other components within normal limits  BASIC METABOLIC PANEL  PRO B NATRIURETIC PEPTIDE  I-STAT TROPOININ, ED   Imaging Review No results found.   EKG Interpretation   Date/Time:  Saturday May 18 2013 10:52:24 EDT Ventricular Rate:  91 PR Interval:  162 QRS Duration: 103 QT Interval:  358 QTC Calculation: 440 R Axis:   -2 Text Interpretation:  Sinus rhythm Abnormal R-wave progression, early  transition When compared with ECG of 03/22/2013 No significant change was  found Confirmed by [facility]  MD, Nunzio Cory ([id number]) on 05/18/2013 11:16:58 AM      MDM   Final diagnoses:  Chest pain  Cough    Chest x-ray; COPD changes, no infiltrates. Negative troponin and BNP. Slight leuokocytosis. Afebrile. Stable electrolytes. Feeling better after albuterol neb tx's x2 and 1 hour long continuous neb tx. Prednisone given here and toradol for discomfort. SPO2 93-100%, no tachypnea or wheezing after tx's. Home with prednisone taper. Plan of care discussed and pt agrees. Return precautions given.       Elisha Headland, NP 05/22/13 2340

## 2013-05-18 NOTE — ED Notes (Signed)
Discharged with staff from [facility]. Patient denies pain and states I am feeling better. Vital signs stable. NAD noted at the time of discharge.

## 2013-05-18 NOTE — ED Notes (Addendum)
Per EMS: Chest pain since 0600 this am, hx of same, however normally it is midsternal.  This time it is more on right side accompanied by right arm numbness and tingling.  Also c/o RUQ pain that started around same times, denies recent injury, denies sob, denies other pain.  Hx of MI, CVA, HTN, COPD.  12 Lead unremarkable.  Was given 4 ASA PTA, [facility].  Given 1 NTG pta, denies relief from pain after NTG administration.  Rates pain 5/10.  Patient states pain is on left side upper chest and right sided anterior lower rib cage.  Worse with inspiration and palpation

## 2013-05-28 NOTE — ED Provider Notes (Signed)
Medical screening examination/treatment/procedure(s) were performed by non-physician practitioner and as supervising physician I was immediately available for consultation/collaboration.   EKG Interpretation   Date/Time:  Saturday May 18 2013 10:52:24 EDT Ventricular Rate:  91 PR Interval:  162 QRS Duration: 103 QT Interval:  358 QTC Calculation: 440 R Axis:   -2 Text Interpretation:  Sinus rhythm Abnormal R-wave progression, early  transition When compared with ECG of 03/22/2013 No significant change was  found Confirmed by [facility]  MD, Kayna Suppa ([id number]) on 05/18/2013 11:16:58 AM        Alfonzo Feller, DO 05/28/13 1038

## 2014-01-08 ENCOUNTER — Encounter (HOSPITAL_COMMUNITY): Payer: Self-pay | Admitting: *Deleted

## 2014-01-08 ENCOUNTER — Emergency Department (HOSPITAL_COMMUNITY)
Admission: EM | Admit: 2014-01-08 | Discharge: 2014-01-08 | Disposition: A | Discharge status: Home / Self Care | Payer: Non-veteran care | Attending: Emergency Medicine | Admitting: Emergency Medicine

## 2014-01-08 DIAGNOSIS — Z79899 Other long term (current) drug therapy: Secondary | ICD-10-CM | POA: Insufficient documentation

## 2014-01-08 DIAGNOSIS — S01311A Laceration without foreign body of right ear, initial encounter: Secondary | ICD-10-CM | POA: Insufficient documentation

## 2014-01-08 DIAGNOSIS — Z7982 Long term (current) use of aspirin: Secondary | ICD-10-CM | POA: Insufficient documentation

## 2014-01-08 DIAGNOSIS — Y929 Unspecified place or not applicable: Secondary | ICD-10-CM | POA: Diagnosis not present

## 2014-01-08 DIAGNOSIS — Y998 Other external cause status: Secondary | ICD-10-CM | POA: Diagnosis not present

## 2014-01-08 DIAGNOSIS — R42 Dizziness and giddiness: Secondary | ICD-10-CM | POA: Insufficient documentation

## 2014-01-08 DIAGNOSIS — Z8709 Personal history of other diseases of the respiratory system: Secondary | ICD-10-CM | POA: Insufficient documentation

## 2014-01-08 DIAGNOSIS — W298XXA Contact with other powered powered hand tools and household machinery, initial encounter: Secondary | ICD-10-CM | POA: Diagnosis not present

## 2014-01-08 DIAGNOSIS — Z7952 Long term (current) use of systemic steroids: Secondary | ICD-10-CM | POA: Diagnosis not present

## 2014-01-08 DIAGNOSIS — R110 Nausea: Secondary | ICD-10-CM | POA: Insufficient documentation

## 2014-01-08 DIAGNOSIS — I252 Old myocardial infarction: Secondary | ICD-10-CM | POA: Diagnosis not present

## 2014-01-08 DIAGNOSIS — I10 Essential (primary) hypertension: Secondary | ICD-10-CM | POA: Insufficient documentation

## 2014-01-08 DIAGNOSIS — Z720 Tobacco use: Secondary | ICD-10-CM | POA: Insufficient documentation

## 2014-01-08 DIAGNOSIS — Y9389 Activity, other specified: Secondary | ICD-10-CM | POA: Diagnosis not present

## 2014-01-08 MED ORDER — MECLIZINE HCL 25 MG PO TABS: 25.0000 mg | ORAL_TABLET | Freq: Three times a day (TID) | ORAL | Status: DC | PRN

## 2014-01-08 NOTE — ED Provider Notes (Signed)
CSN: [id number]     Arrival date & time 01/08/14  0102 History   First MD Initiated Contact with Patient 01/08/14 [phone]     Chief Complaint  Patient presents with  . Ear Injury     (Consider location/radiation/quality/duration/timing/severity/associated sxs/prior Treatment) HPI  Pt is a 63yo male presenting to ED with reports of right external ear bleeding that started "a few hours" PTA. Pt states he has a blood vessel close to the skin he accidentally cut when trimming hair. States last time this happened it took "forever" for bleeding to stop.  Pt only takes aspirin, no other blood thinners.  Pt states he used a cigarette to help stop the bleeding but states it did not help so he padded the wound with tissue paper and a bandage.  No other injuries.  Pt states he does have an unrelated concern for vertigo. States within the last few weeks he has had several episodes of vertigo, described as the room spinning, when he stands or lies down too quickly. States episodes only last about 5-6 seconds as he is able to focus on his finger to help stop the spinning sensation.  Reports associated nausea if vertigo lasts longer than a few seconds. Reports mild congestion but no cough, fever, vomiting, or headache. Denies any other symptoms. States he has been eating and drinking well. Denies head injury or falls. No change in balance. Does report hx of vertigo in the past for which he has taken meclizine.    Past Medical History  Diagnosis Date  . Hypertension   . Bronchitis   . Anxiety   . Mental disorder   . Depression   . Suicide attempt   . MI (myocardial infarction)    Past Surgical History  Procedure Laterality Date  . Skin cancer excision    . Cryptorchidism     Family History  Problem Relation Age of Onset  . Dementia Father    History  Substance Use Topics  . Smoking status: Current Every Day Smoker -- 0.50 packs/day for 20 years    Types: Cigarettes  . Smokeless tobacco: Never  Used  . Alcohol Use: No     Comment: denies use of any drugs or alcohol    Review of Systems  Constitutional: Negative for fever and chills.  HENT: Negative for congestion.   Eyes: Negative for photophobia and visual disturbance.  Respiratory: Negative for cough and choking.   Gastrointestinal: Positive for nausea. Negative for vomiting.  Skin: Positive for wound ( right ear). Negative for color change.  Neurological: Positive for dizziness. Negative for syncope, weakness, light-headedness and headaches.  All other systems reviewed and are negative.     Allergies  Demerol and Zocor  Home Medications   Prior to Admission medications   Medication Sig Start Date End Date Taking? Authorizing Provider  acetaminophen (TYLENOL) 500 MG tablet Take 500 mg by mouth every 6 (six) hours as needed for moderate pain.    Historical Provider, MD  ANTIPYRINE-BENZOCAINE OT Place 1 drop into both ears daily as needed (ITCHING).    Historical Provider, MD  ARIPiprazole (ABILIFY) 5 MG tablet Take 1 tablet (5 mg total) by mouth 2 (two) times daily at 8 am and 10 pm. For depression and anxiety. 01/21/13   Nena Polio, PA-C  aspirin 81 MG tablet Take 81 mg by mouth daily.    Historical Provider, MD  DULoxetine (CYMBALTA) 30 MG capsule Take 30 mg by mouth daily.    Historical Provider,  MD  flunisolide (NASALIDE) 25 MCG/ACT (0.025%) SOLN Place 1 spray into both nostrils 2 (two) times daily as needed (STUFFY NOSE).    Historical Provider, MD  gabapentin (NEURONTIN) 300 MG capsule Take 300 mg by mouth 2 (two) times daily.    Historical Provider, MD  guaiFENesin-dextromethorphan (ROBITUSSIN DM) 100-10 MG/5ML syrup Take 5 mLs by mouth every 4 (four) hours as needed for cough.    Historical Provider, MD  HYDROcodone-acetaminophen (NORCO/VICODIN) 5-325 MG per tablet Take 1 tablet by mouth every 8 (eight) hours as needed for moderate pain.    Historical Provider, MD  ibuprofen (ADVIL,MOTRIN) 200 MG tablet Take  200 mg by mouth every 6 (six) hours as needed for mild pain.    Historical Provider, MD  Ipratropium-Albuterol (COMBIVENT RESPIMAT) 20-100 MCG/ACT AERS respimat Inhale 1 puff into the lungs every 6 (six) hours as needed for wheezing.    Historical Provider, MD  lisinopril (PRINIVIL,ZESTRIL) 20 MG tablet Take 1 tablet (20 mg total) by mouth daily. For high blood pressure 01/21/13   Nena Polio, PA-C  meclizine (ANTIVERT) 25 MG tablet Take 1 tablet (25 mg total) by mouth 3 (three) times daily as needed for dizziness. 01/08/14   Noland Fordyce, PA-C  Naphazoline HCl (CLEAR EYES OP) Place 1-2 drops into both eyes 2 (two) times daily as needed (DRY EYES).    Historical Provider, MD  predniSONE (DELTASONE) 10 MG tablet Take 5 pills on day one, take 4 pills on day two, take 3 pills on day three, take 2 pills on day four, take 1 pill on day 5. 05/18/13   Elisha Headland, NP  sodium chloride (OCEAN) 0.65 % SOLN nasal spray Place 2 sprays into both nostrils 4 (four) times daily as needed for congestion.    Historical Provider, MD  triamcinolone cream (KENALOG) 0.1 % Apply 1 application topically daily as needed (ITCHING).    Historical Provider, MD   BP 150/86 mmHg  Pulse 99  Temp(Src) 97.3 F (36.3 C) (Oral)  Resp 17  Ht 6\' 2"  (1.88 m)  Wt 300 lb (136.079 kg)  BMI 38.50 kg/m2  SpO2 96% Physical Exam  Constitutional: He is oriented to person, place, and time. He appears well-developed and well-nourished.  HENT:  Head: Normocephalic and atraumatic.  Eyes: EOM are normal. Pupils are equal, round, and reactive to light.  Neck: Normal range of motion. Neck supple.  Cardiovascular: Normal rate.   Pulmonary/Chest: Effort normal. No respiratory distress.  Musculoskeletal: Normal range of motion.  Neurological: He is alert and oriented to person, place, and time. No cranial nerve deficit. Coordination normal.  Alert and oriented to person, place, and time. fluent speech. Normal gait  Skin: Skin is warm and  dry.  Right external ear: 0.5cm clotted laceration. No active bleeding.   Psychiatric: He has a normal mood and affect. His behavior is normal.  Nursing note and vitals reviewed.   ED Course  Procedures   The wound is cleansed, debrided of foreign material as much as possible, and dressed. The patient is alerted to watch for any signs of infection (redness, pus, pain, increased swelling or fever) and call if such occurs. Home wound care instructions are provided.   Labs Review Labs Reviewed - No data to display  Imaging Review No results found.   EKG Interpretation None      MDM   Final diagnoses:  Laceration of right ear without complication, initial encounter  Vertigo    Pt presenting to ED with c/o laceration  to right external ear that would not stop bleeding.  On exam, pt had pressure bandage on, which was gently removed with use of saline flush.  Wound appears clean, no active bleeding. New bandage reapplied. Home care instructions provided.  Pt also c/o intermittent vertigo. Hx of same.  No focal neuro deficits. Discharged home with meclizine.   Return precautions provided. Pt verbalized understanding and agreement with tx plan.     Noland Fordyce, PA-C 01/08/14 [place], MD 01/08/14 [phone]

## 2014-01-08 NOTE — ED Notes (Signed)
Declined W/C at D/C and was escorted to lobby by RN. 

## 2014-01-08 NOTE — ED Notes (Signed)
Pt reports he cut an enlarged blood vessel on on RT ear while trimming hair.

## 2014-05-23 ENCOUNTER — Other Ambulatory Visit (HOSPITAL_COMMUNITY): Payer: Self-pay

## 2014-05-23 ENCOUNTER — Emergency Department (HOSPITAL_COMMUNITY): Payer: Non-veteran care

## 2014-05-23 ENCOUNTER — Encounter (HOSPITAL_COMMUNITY): Payer: Self-pay | Admitting: Emergency Medicine

## 2014-05-23 ENCOUNTER — Inpatient Hospital Stay (HOSPITAL_COMMUNITY)
Admission: EM | Admit: 2014-05-23 | Discharge: 2014-05-26 | DRG: 192 | Disposition: A | Discharge status: Home / Self Care | Payer: Non-veteran care | Attending: Internal Medicine | Admitting: Internal Medicine

## 2014-05-23 DIAGNOSIS — E1159 Type 2 diabetes mellitus with other circulatory complications: Secondary | ICD-10-CM | POA: Diagnosis present

## 2014-05-23 DIAGNOSIS — T380X5A Adverse effect of glucocorticoids and synthetic analogues, initial encounter: Secondary | ICD-10-CM | POA: Diagnosis not present

## 2014-05-23 DIAGNOSIS — E785 Hyperlipidemia, unspecified: Secondary | ICD-10-CM | POA: Diagnosis present

## 2014-05-23 DIAGNOSIS — F17210 Nicotine dependence, cigarettes, uncomplicated: Secondary | ICD-10-CM | POA: Diagnosis present

## 2014-05-23 DIAGNOSIS — F329 Major depressive disorder, single episode, unspecified: Secondary | ICD-10-CM | POA: Diagnosis present

## 2014-05-23 DIAGNOSIS — R739 Hyperglycemia, unspecified: Secondary | ICD-10-CM | POA: Diagnosis present

## 2014-05-23 DIAGNOSIS — D751 Secondary polycythemia: Secondary | ICD-10-CM | POA: Diagnosis present

## 2014-05-23 DIAGNOSIS — I10 Essential (primary) hypertension: Secondary | ICD-10-CM | POA: Diagnosis present

## 2014-05-23 DIAGNOSIS — Z720 Tobacco use: Secondary | ICD-10-CM | POA: Diagnosis not present

## 2014-05-23 DIAGNOSIS — F419 Anxiety disorder, unspecified: Secondary | ICD-10-CM | POA: Diagnosis present

## 2014-05-23 DIAGNOSIS — Z885 Allergy status to narcotic agent status: Secondary | ICD-10-CM | POA: Diagnosis not present

## 2014-05-23 DIAGNOSIS — Z888 Allergy status to other drugs, medicaments and biological substances status: Secondary | ICD-10-CM | POA: Diagnosis not present

## 2014-05-23 DIAGNOSIS — G4733 Obstructive sleep apnea (adult) (pediatric): Secondary | ICD-10-CM | POA: Diagnosis present

## 2014-05-23 DIAGNOSIS — I252 Old myocardial infarction: Secondary | ICD-10-CM | POA: Diagnosis not present

## 2014-05-23 DIAGNOSIS — R0600 Dyspnea, unspecified: Secondary | ICD-10-CM

## 2014-05-23 DIAGNOSIS — R079 Chest pain, unspecified: Secondary | ICD-10-CM

## 2014-05-23 DIAGNOSIS — D72829 Elevated white blood cell count, unspecified: Secondary | ICD-10-CM | POA: Diagnosis not present

## 2014-05-23 DIAGNOSIS — J441 Chronic obstructive pulmonary disease with (acute) exacerbation: Secondary | ICD-10-CM | POA: Diagnosis present

## 2014-05-23 DIAGNOSIS — I152 Hypertension secondary to endocrine disorders: Secondary | ICD-10-CM | POA: Diagnosis present

## 2014-05-23 HISTORY — DX: Chronic obstructive pulmonary disease, unspecified: J44.9

## 2014-05-23 HISTORY — DX: Obstructive sleep apnea (adult) (pediatric): G47.33

## 2014-05-23 LAB — BASIC METABOLIC PANEL
Anion gap: 7 (ref 5–15)
BUN: 17 mg/dL (ref 6–23)
CO2: 24 mmol/L (ref 19–32)
Calcium: 8.7 mg/dL (ref 8.4–10.5)
Chloride: 104 mmol/L (ref 96–112)
Creatinine, Ser: 1.16 mg/dL (ref 0.50–1.35)
GFR calc Af Amer: 76 mL/min — ABNORMAL LOW (ref >90)
GFR calc non Af Amer: 66 mL/min — ABNORMAL LOW (ref >90)
Glucose, Bld: 171 mg/dL — ABNORMAL HIGH (ref 70–99)
Potassium: 4.3 mmol/L (ref 3.5–5.1)
Sodium: 135 mmol/L (ref 135–145)

## 2014-05-23 LAB — I-STAT TROPONIN, ED: Troponin i, poc: 0 ng/mL (ref 0.00–0.08)

## 2014-05-23 LAB — CBC
HCT: 50.1 % (ref 39.0–52.0)
Hemoglobin: 17.2 g/dL — ABNORMAL HIGH (ref 13.0–17.0)
MCH: 32.4 pg (ref 26.0–34.0)
MCHC: 34.3 g/dL (ref 30.0–36.0)
MCV: 94.4 fL (ref 78.0–100.0)
Platelets: 234 10*3/uL (ref 150–400)
RBC: 5.31 MIL/uL (ref 4.22–5.81)
RDW: 13.8 % (ref 11.5–15.5)
WBC: 12.6 10*3/uL — ABNORMAL HIGH (ref 4.0–10.5)

## 2014-05-23 LAB — TROPONIN I: Troponin I: <0.03 ng/mL (ref <0.031)

## 2014-05-23 LAB — BRAIN NATRIURETIC PEPTIDE: B Natriuretic Peptide: 20.3 pg/mL (ref 0.0–100.0)

## 2014-05-23 MED ORDER — BUPROPION HCL ER (SR) 150 MG PO TB12
150.0000 mg | ORAL_TABLET | Freq: Two times a day (BID) | ORAL | Status: DC
  Administered 2014-05-24 – 2014-05-26 (×5): 150 mg via ORAL
  Filled 2014-05-23 (×7): qty 1

## 2014-05-23 MED ORDER — ATORVASTATIN CALCIUM 80 MG PO TABS
80.0000 mg | ORAL_TABLET | Freq: Every day | ORAL | Status: DC
  Administered 2014-05-24 – 2014-05-25 (×2): 80 mg via ORAL
  Filled 2014-05-23 (×3): qty 1

## 2014-05-23 MED ORDER — IPRATROPIUM-ALBUTEROL 0.5-2.5 (3) MG/3ML IN SOLN
3.0000 mL | Freq: Four times a day (QID) | RESPIRATORY_TRACT | Status: DC | PRN
  Administered 2014-05-26: 3 mL via RESPIRATORY_TRACT
  Filled 2014-05-23: qty 3

## 2014-05-23 MED ORDER — MORPHINE SULFATE 4 MG/ML IJ SOLN
4.0000 mg | Freq: Once | INTRAMUSCULAR | Status: AC
  Administered 2014-05-23: 4 mg via INTRAVENOUS
  Filled 2014-05-23: qty 1

## 2014-05-23 MED ORDER — SODIUM CHLORIDE 0.9 % IJ SOLN
3.0000 mL | Freq: Two times a day (BID) | INTRAMUSCULAR | Status: DC
  Administered 2014-05-24 – 2014-05-25 (×5): 3 mL via INTRAVENOUS

## 2014-05-23 MED ORDER — GABAPENTIN 300 MG PO CAPS
300.0000 mg | ORAL_CAPSULE | Freq: Two times a day (BID) | ORAL | Status: DC
  Administered 2014-05-24 – 2014-05-26 (×6): 300 mg via ORAL
  Filled 2014-05-23 (×7): qty 1

## 2014-05-23 MED ORDER — LISINOPRIL 20 MG PO TABS
20.0000 mg | ORAL_TABLET | Freq: Every day | ORAL | Status: DC
Start: 2014-05-24 — End: 2014-05-26
  Administered 2014-05-24 – 2014-05-26 (×3): 20 mg via ORAL
  Filled 2014-05-23 (×3): qty 1

## 2014-05-23 MED ORDER — ONDANSETRON HCL 4 MG/2ML IJ SOLN
4.0000 mg | Freq: Once | INTRAMUSCULAR | Status: AC
  Administered 2014-05-23: 4 mg via INTRAVENOUS
  Filled 2014-05-23: qty 2

## 2014-05-23 MED ORDER — ACETAMINOPHEN 325 MG PO TABS
650.0000 mg | ORAL_TABLET | Freq: Four times a day (QID) | ORAL | Status: DC | PRN
Start: 2014-05-23 — End: 2014-05-26
  Administered 2014-05-24 – 2014-05-26 (×4): 650 mg via ORAL
  Filled 2014-05-23 (×4): qty 2

## 2014-05-23 MED ORDER — ENOXAPARIN SODIUM 80 MG/0.8ML ~~LOC~~ SOLN
70.0000 mg | SUBCUTANEOUS | Status: DC
  Administered 2014-05-24 – 2014-05-26 (×3): 70 mg via SUBCUTANEOUS
  Filled 2014-05-23 (×3): qty 0.8

## 2014-05-23 MED ORDER — GI COCKTAIL ~~LOC~~
30.0000 mL | Freq: Once | ORAL | Status: AC
  Administered 2014-05-23: 30 mL via ORAL

## 2014-05-23 MED ORDER — ALBUTEROL SULFATE (2.5 MG/3ML) 0.083% IN NEBU: 5.0000 mg | INHALATION_SOLUTION | RESPIRATORY_TRACT | Status: DC

## 2014-05-23 MED ORDER — SODIUM CHLORIDE 0.9 % IJ SOLN: 3.0000 mL | INTRAMUSCULAR | Status: DC | PRN

## 2014-05-23 MED ORDER — TIOTROPIUM BROMIDE MONOHYDRATE 18 MCG IN CAPS
18.0000 ug | ORAL_CAPSULE | Freq: Every day | RESPIRATORY_TRACT | Status: DC
  Administered 2014-05-24 – 2014-05-25 (×2): 18 ug via RESPIRATORY_TRACT
  Filled 2014-05-23: qty 5

## 2014-05-23 MED ORDER — AMITRIPTYLINE HCL 10 MG PO TABS
20.0000 mg | ORAL_TABLET | Freq: Every day | ORAL | Status: DC
  Administered 2014-05-24 – 2014-05-25 (×3): 20 mg via ORAL
  Filled 2014-05-23 (×4): qty 2

## 2014-05-23 MED ORDER — MECLIZINE HCL 25 MG PO TABS
25.0000 mg | ORAL_TABLET | Freq: Three times a day (TID) | ORAL | Status: DC | PRN
  Filled 2014-05-23: qty 1

## 2014-05-23 MED ORDER — ASPIRIN 81 MG PO CHEW
324.0000 mg | CHEWABLE_TABLET | Freq: Once | ORAL | Status: AC
  Administered 2014-05-23: 324 mg via ORAL
  Filled 2014-05-23: qty 4

## 2014-05-23 MED ORDER — ARIPIPRAZOLE 5 MG PO TABS
5.0000 mg | ORAL_TABLET | Freq: Every day | ORAL | Status: DC
  Administered 2014-05-24 – 2014-05-26 (×3): 5 mg via ORAL
  Filled 2014-05-23 (×3): qty 1

## 2014-05-23 MED ORDER — IPRATROPIUM BROMIDE 0.02 % IN SOLN: 0.5000 mg | RESPIRATORY_TRACT | Status: DC

## 2014-05-23 MED ORDER — ACETAMINOPHEN 500 MG PO TABS: 500.0000 mg | ORAL_TABLET | Freq: Four times a day (QID) | ORAL | Status: DC | PRN

## 2014-05-23 MED ORDER — SALINE SPRAY 0.65 % NA SOLN
2.0000 | Freq: Four times a day (QID) | NASAL | Status: DC | PRN
  Filled 2014-05-23: qty 44

## 2014-05-23 MED ORDER — SODIUM CHLORIDE 0.9 % IJ SOLN
3.0000 mL | Freq: Two times a day (BID) | INTRAMUSCULAR | Status: DC
  Administered 2014-05-24 – 2014-05-25 (×2): 3 mL via INTRAVENOUS

## 2014-05-23 MED ORDER — NICOTINE 21 MG/24HR TD PT24
21.0000 mg | MEDICATED_PATCH | Freq: Every day | TRANSDERMAL | Status: DC
  Administered 2014-05-24 – 2014-05-26 (×3): 21 mg via TRANSDERMAL
  Filled 2014-05-23 (×3): qty 1

## 2014-05-23 MED ORDER — ONDANSETRON HCL 4 MG/2ML IJ SOLN: 4.0000 mg | Freq: Four times a day (QID) | INTRAMUSCULAR | Status: DC | PRN

## 2014-05-23 MED ORDER — ASPIRIN 81 MG PO TABS: 81.0000 mg | ORAL_TABLET | Freq: Every day | ORAL | Status: DC

## 2014-05-23 MED ORDER — LEVOFLOXACIN IN D5W 500 MG/100ML IV SOLN
500.0000 mg | INTRAVENOUS | Status: DC
  Administered 2014-05-24 – 2014-05-25 (×2): 500 mg via INTRAVENOUS
  Filled 2014-05-23 (×2): qty 100

## 2014-05-23 MED ORDER — DULOXETINE HCL 30 MG PO CPEP
30.0000 mg | ORAL_CAPSULE | Freq: Every day | ORAL | Status: DC
  Administered 2014-05-24 – 2014-05-26 (×3): 30 mg via ORAL
  Filled 2014-05-23 (×3): qty 1

## 2014-05-23 MED ORDER — ASPIRIN EC 325 MG PO TBEC
325.0000 mg | DELAYED_RELEASE_TABLET | Freq: Every day | ORAL | Status: DC
  Administered 2014-05-24 – 2014-05-26 (×3): 325 mg via ORAL
  Filled 2014-05-23 (×3): qty 1

## 2014-05-23 MED ORDER — ALBUTEROL SULFATE (2.5 MG/3ML) 0.083% IN NEBU
5.0000 mg | INHALATION_SOLUTION | Freq: Once | RESPIRATORY_TRACT | Status: AC
  Administered 2014-05-23: 5 mg via RESPIRATORY_TRACT
  Filled 2014-05-23: qty 6

## 2014-05-23 MED ORDER — SODIUM CHLORIDE 0.9 % IV SOLN: 250.0000 mL | INTRAVENOUS | Status: DC | PRN

## 2014-05-23 MED ORDER — GI COCKTAIL ~~LOC~~
30.0000 mL | Freq: Once | ORAL | Status: DC
  Filled 2014-05-23: qty 30

## 2014-05-23 MED ORDER — ACETAMINOPHEN 650 MG RE SUPP: 650.0000 mg | Freq: Four times a day (QID) | RECTAL | Status: DC | PRN

## 2014-05-23 MED ORDER — ALBUTEROL SULFATE (2.5 MG/3ML) 0.083% IN NEBU: 2.5000 mg | INHALATION_SOLUTION | Freq: Four times a day (QID) | RESPIRATORY_TRACT | Status: DC | PRN

## 2014-05-23 MED ORDER — IPRATROPIUM BROMIDE 0.02 % IN SOLN
0.5000 mg | Freq: Once | RESPIRATORY_TRACT | Status: AC
  Administered 2014-05-23: 0.5 mg via RESPIRATORY_TRACT
  Filled 2014-05-23: qty 2.5

## 2014-05-23 MED ORDER — METHYLPREDNISOLONE SODIUM SUCC 125 MG IJ SOLR
80.0000 mg | Freq: Three times a day (TID) | INTRAMUSCULAR | Status: DC
  Administered 2014-05-24: 80 mg via INTRAVENOUS
  Filled 2014-05-23 (×4): qty 1.28

## 2014-05-23 MED ORDER — ALBUTEROL SULFATE (2.5 MG/3ML) 0.083% IN NEBU: 2.5000 mg | INHALATION_SOLUTION | Freq: Four times a day (QID) | RESPIRATORY_TRACT | Status: DC

## 2014-05-23 MED ORDER — PANTOPRAZOLE SODIUM 40 MG PO TBEC
40.0000 mg | DELAYED_RELEASE_TABLET | Freq: Every day | ORAL | Status: DC
  Administered 2014-05-24 – 2014-05-26 (×3): 40 mg via ORAL
  Filled 2014-05-23 (×3): qty 1

## 2014-05-23 MED ORDER — METHYLPREDNISOLONE SODIUM SUCC 125 MG IJ SOLR
125.0000 mg | Freq: Once | INTRAMUSCULAR | Status: AC
  Administered 2014-05-23: 125 mg via INTRAVENOUS
  Filled 2014-05-23: qty 2

## 2014-05-23 NOTE — ED Notes (Signed)
Was called for report. Pt now reporting chest pain, so the pt cannot go up until he is chest pain free.

## 2014-05-23 NOTE — ED Provider Notes (Signed)
CSN: [id number]     Arrival date & time 05/23/14  1843 History   First MD Initiated Contact with Patient 05/23/14 1857     Chief Complaint  Patient presents with  . Chest Pain     (Consider location/radiation/quality/duration/timing/severity/associated sxs/prior Treatment) HPI Comments: Patient with history of COPD (not oxygen dependent but is on 2 L at night with CPAP), cardiac catheterization done at the [facility] 3 weeks ago which showed mild obstructive disease per patient but not bad enough for stenting -- presents with complaint of chest pain described as a pressure and heaviness in the middle of the chest which occurred at rest approximately one and half hours prior to arrival. Patient has had similar chest pains in the past. Pressure does not radiate. It was not associated with diaphoresis, nausea/vomiting, palpitations, lightheadedness. Patient also reports worsening shortness of breath over the past week, especially with activity. He describes becoming very short of breath and needing to take breaks while taking out the garbage. No treatments prior to arrival. Patient took a baby aspirin this morning but no other aspirin. Patient denies increasing orthopnea. No worsening lower extremity edema. No history of congestive heart failure. Patient has noted some dizziness described as spinning at times when he lies flat or sits up. No syncope. Onset of symptoms acute. Course is improving. Nothing makes symptoms better or worse.  Patient is a 64 y.o. male presenting with chest pain. The history is provided by the patient and medical records.  Chest Pain Associated symptoms: back pain (chronic, unchanged) and shortness of breath   Associated symptoms: no abdominal pain, no cough, no diaphoresis, no fever, no nausea, no palpitations and not vomiting     Past Medical History  Diagnosis Date  . Hypertension   . Bronchitis   . Anxiety   . Mental disorder   . Depression   . Suicide attempt   .  MI (myocardial infarction)    Past Surgical History  Procedure Laterality Date  . Skin cancer excision    . Cryptorchidism     Family History  Problem Relation Age of Onset  . Dementia Father    History  Substance Use Topics  . Smoking status: Current Every Day Smoker -- 0.50 packs/day for 20 years    Types: Cigarettes  . Smokeless tobacco: Never Used  . Alcohol Use: No     Comment: denies use of any drugs or alcohol    Review of Systems  Constitutional: Negative for fever and diaphoresis.  Eyes: Negative for redness.  Respiratory: Positive for shortness of breath. Negative for cough and wheezing.   Cardiovascular: Positive for chest pain. Negative for palpitations and leg swelling.  Gastrointestinal: Negative for nausea, vomiting and abdominal pain.  Genitourinary: Negative for dysuria.  Musculoskeletal: Positive for back pain (chronic, unchanged). Negative for neck pain.  Skin: Negative for rash.  Neurological: Negative for syncope and light-headedness.  Psychiatric/Behavioral: The patient is not nervous/anxious.       Allergies  Demerol and Zocor  Home Medications   Prior to Admission medications   Medication Sig Start Date End Date Taking? Authorizing Provider  acetaminophen (TYLENOL) 500 MG tablet Take 500 mg by mouth every 6 (six) hours as needed for moderate pain.    Historical Provider, MD  ANTIPYRINE-BENZOCAINE OT Place 1 drop into both ears daily as needed (ITCHING).    Historical Provider, MD  ARIPiprazole (ABILIFY) 5 MG tablet Take 1 tablet (5 mg total) by mouth 2 (two) times daily at  8 am and 10 pm. For depression and anxiety. 01/21/13   Ruben Im, PA-C  aspirin 81 MG tablet Take 81 mg by mouth daily.    Historical Provider, MD  DULoxetine (CYMBALTA) 30 MG capsule Take 30 mg by mouth daily.    Historical Provider, MD  flunisolide (NASALIDE) 25 MCG/ACT (0.025%) SOLN Place 1 spray into both nostrils 2 (two) times daily as needed (STUFFY NOSE).     Historical Provider, MD  gabapentin (NEURONTIN) 300 MG capsule Take 300 mg by mouth 2 (two) times daily.    Historical Provider, MD  guaiFENesin-dextromethorphan (ROBITUSSIN DM) 100-10 MG/5ML syrup Take 5 mLs by mouth every 4 (four) hours as needed for cough.    Historical Provider, MD  HYDROcodone-acetaminophen (NORCO/VICODIN) 5-325 MG per tablet Take 1 tablet by mouth every 8 (eight) hours as needed for moderate pain.    Historical Provider, MD  ibuprofen (ADVIL,MOTRIN) 200 MG tablet Take 200 mg by mouth every 6 (six) hours as needed for mild pain.    Historical Provider, MD  Ipratropium-Albuterol (COMBIVENT RESPIMAT) 20-100 MCG/ACT AERS respimat Inhale 1 puff into the lungs every 6 (six) hours as needed for wheezing.    Historical Provider, MD  lisinopril (PRINIVIL,ZESTRIL) 20 MG tablet Take 1 tablet (20 mg total) by mouth daily. For high blood pressure 01/21/13   Ruben Im, PA-C  meclizine (ANTIVERT) 25 MG tablet Take 1 tablet (25 mg total) by mouth 3 (three) times daily as needed for dizziness. 01/08/14   Noland Fordyce, PA-C  Naphazoline HCl (CLEAR EYES OP) Place 1-2 drops into both eyes 2 (two) times daily as needed (DRY EYES).    Historical Provider, MD  predniSONE (DELTASONE) 10 MG tablet Take 5 pills on day one, take 4 pills on day two, take 3 pills on day three, take 2 pills on day four, take 1 pill on day 5. 05/18/13   Elisha Headland, NP  sodium chloride (OCEAN) 0.65 % SOLN nasal spray Place 2 sprays into both nostrils 4 (four) times daily as needed for congestion.    Historical Provider, MD  triamcinolone cream (KENALOG) 0.1 % Apply 1 application topically daily as needed (ITCHING).    Historical Provider, MD   BP 143/91 mmHg  Pulse 102  Temp(Src) 97.8 F (36.6 C) (Oral)  Resp 20  Ht 6\' 2"  (1.88 m)  Wt 317 lb 8 oz (144.017 kg)  BMI 40.75 kg/m2  SpO2 94%   Physical Exam  Constitutional: He appears well-developed and well-nourished.  HENT:  Head: Normocephalic and atraumatic.   Mouth/Throat: Mucous membranes are normal. Mucous membranes are not dry.  Eyes: Conjunctivae are normal.  Neck: Trachea normal and normal range of motion. Neck supple. Normal carotid pulses and no JVD present. No muscular tenderness present. Carotid bruit is not present. No tracheal deviation present.  Cardiovascular: Normal rate, regular rhythm, S1 normal, S2 normal, normal heart sounds and intact distal pulses.  Exam reveals no distant heart sounds and no decreased pulses.   No murmur heard. Pulmonary/Chest: Effort normal and breath sounds normal. No respiratory distress. He has no wheezes. He exhibits no tenderness.  Abdominal: Soft. Normal aorta and bowel sounds are normal. There is no tenderness. There is no rebound and no guarding.  Musculoskeletal: He exhibits no edema.  Neurological: He is alert.  Skin: Skin is warm and dry. He is not diaphoretic. No cyanosis. No pallor.  Psychiatric: He has a normal mood and affect.  Nursing note and vitals reviewed.   ED Course  Procedures (including critical care time) Labs Review Labs Reviewed  CBC - Abnormal; Notable for the following:    WBC 12.6 (*)    Hemoglobin 17.2 (*)    All other components within normal limits  BASIC METABOLIC PANEL - Abnormal; Notable for the following:    Glucose, Bld 171 (*)    GFR calc non Af Amer 66 (*)    GFR calc Af Amer 76 (*)    All other components within normal limits  BRAIN NATRIURETIC PEPTIDE  I-STAT TROPOININ, ED    Imaging Review Dg Chest 2 View  05/23/2014   CLINICAL DATA:  Chest pain, COPD  EXAM: CHEST  2 VIEW  COMPARISON:  05/18/2013  FINDINGS: Cardiomediastinal silhouette is stable. No acute infiltrate or pleural effusion. No pulmonary edema. Hyperinflation again noted. There is linear atelectasis or scarring in lingula. Mild degenerative changes thoracic spine.  IMPRESSION: No active disease. Hyperinflation again noted. Linear atelectasis or scarring in lingula.   Electronically Signed   By:  Lahoma Crocker M.D.   On: 05/23/2014 20:11     EKG Interpretation   Date/Time:  Friday May 23 2014 18:48:21 EDT Ventricular Rate:  105 PR Interval:  156 QRS Duration: 110 QT Interval:  346 QTC Calculation: 457 R Axis:   6 Text Interpretation:  Sinus tachycardia Otherwise normal ECG No  significant change since last tracing Confirmed by Mingo Amber  MD, [place]  ([id number]) on 05/23/2014 8:21:58 PM       6:59 PM Patient seen and examined. EKG reviewed, sinus tachycardia, no ischemic change. Work-up initiated. Medications ordered.   Vital signs reviewed and are as follows: BP 143/91 mmHg  Pulse 102  Temp(Src) 97.8 F (36.6 C) (Oral)  Resp 20  Ht 6\' 2"  (1.88 m)  Wt 317 lb 8 oz (144.017 kg)  BMI 40.75 kg/m2  SpO2 94%  10:00 PM patient ambulated with oxygen saturations to 78%. Will admit for COPD exacerbation and chest pain.  Spoke with Dr. Maudie Mercury of [facility] who will see patient.  Will give Solu-Medrol and albuterol/Atrovent.  MDM   Final diagnoses:  COPD exacerbation  Chest pain, unspecified chest pain type   Admit for above.    Carlisle Cater, PA-C 05/23/14 2201  Evelina Bucy, MD 05/23/14 [phone]

## 2014-05-23 NOTE — ED Notes (Signed)
Pt reports centralized cp that started at rest 1 hour ago, pt sts he's having a hard time catching his breath and dizziness when he lays down. Pt had cardiac cath completed a few weeks ago.

## 2014-05-23 NOTE — H&P (Addendum)
Mario [name] is an 64 y.o. male.    [facility]  Chief Complaint: dyspnea HPI: 64 yo male with Copd not on home o2, OSA, Tobacco use,  apparently c/o dry cough and increase in sob today and therefore presented to ED.  Pt was wheezing and pox 78% on ra while walking.  CXR negative.  Pt noted slight chest pain substernal with radiation to neck ,  pt states that he had a cardiac cath about 2 months ago at the [facility].   Pt will be admitted for Copd exacerbation.    Past Medical History  Diagnosis Date  . Hypertension   . Bronchitis   . Anxiety   . Mental disorder   . Depression   . Suicide attempt   . MI (myocardial infarction)     ????  . COPD (chronic obstructive pulmonary disease)   . OSA (obstructive sleep apnea)     Past Surgical History  Procedure Laterality Date  . Skin cancer excision    . Cryptorchidism      Family History  Problem Relation Age of Onset  . Dementia Father    Social History:  reports that he has been smoking Cigarettes.  He has a 10 pack-year smoking history. He has never used smokeless tobacco. He reports that he does not drink alcohol or use illicit drugs.  Allergies:  Allergies  Allergen Reactions  . Demerol [Meperidine] Nausea And Vomiting    Violently sick  . Zocor [Simvastatin] Other (See Comments)    Made him very jittery, nausea and vomiting  Medications reviewed   (Not in a hospital admission)  Results for orders placed or performed during the hospital encounter of 05/23/14 (from the past 48 hour(s))  CBC     Status: Abnormal   Collection Time: 05/23/14  6:58 PM  Result Value Ref Range   WBC 12.6 (H) 4.0 - 10.5 K/uL   RBC 5.31 4.22 - 5.81 MIL/uL   Hemoglobin 17.2 (H) 13.0 - 17.0 g/dL   HCT 50.1 39.0 - 52.0 %   MCV 94.4 78.0 - 100.0 fL   MCH 32.4 26.0 - 34.0 pg   MCHC 34.3 30.0 - 36.0 g/dL   RDW 13.8 11.5 - 15.5 %   Platelets 234 150 - 400 K/uL  Basic metabolic panel     Status: Abnormal   Collection Time: 05/23/14  6:58 PM  Result  Value Ref Range   Sodium 135 135 - 145 mmol/L   Potassium 4.3 3.5 - 5.1 mmol/L   Chloride 104 96 - 112 mmol/L   CO2 24 19 - 32 mmol/L   Glucose, Bld 171 (H) 70 - 99 mg/dL   BUN 17 6 - 23 mg/dL   Creatinine, Ser 1.16 0.50 - 1.35 mg/dL   Calcium 8.7 8.4 - 10.5 mg/dL   GFR calc non Af Amer 66 (L) >90 mL/min   GFR calc Af Amer 76 (L) >90 mL/min    Comment: (NOTE) The eGFR has been calculated using the CKD EPI equation. This calculation has not been validated in all clinical situations. eGFR's persistently <90 mL/min signify possible Chronic Kidney Disease.    Anion gap 7 5 - 15  BNP (order ONLY if patient complains of dyspnea/SOB AND you have documented it for THIS visit)     Status: None   Collection Time: 05/23/14  6:58 PM  Result Value Ref Range   B Natriuretic Peptide 20.3 0.0 - 100.0 pg/mL  I-stat troponin, ED (not at [facility])  Status: None   Collection Time: 05/23/14  7:10 PM  Result Value Ref Range   Troponin i, poc 0.00 0.00 - 0.08 ng/mL   Comment 3            Comment: Due to the release kinetics of cTnI, a negative result within the first hours of the onset of symptoms does not rule out myocardial infarction with certainty. If myocardial infarction is still suspected, repeat the test at appropriate intervals.    Dg Chest 2 View  05/23/2014   CLINICAL DATA:  Chest pain, COPD  EXAM: CHEST  2 VIEW  COMPARISON:  05/18/2013  FINDINGS: Cardiomediastinal silhouette is stable. No acute infiltrate or pleural effusion. No pulmonary edema. Hyperinflation again noted. There is linear atelectasis or scarring in lingula. Mild degenerative changes thoracic spine.  IMPRESSION: No active disease. Hyperinflation again noted. Linear atelectasis or scarring in lingula.   Electronically Signed   By: Lahoma Crocker M.D.   On: 05/23/2014 20:11    Review of Systems  Constitutional: Negative.   HENT: Negative.   Eyes: Negative.   Respiratory: Positive for cough, shortness of breath and wheezing.  Negative for hemoptysis and sputum production.   Cardiovascular: Negative.   Gastrointestinal: Negative.   Genitourinary: Negative.   Musculoskeletal: Negative.   Skin: Negative.   Neurological: Negative.   Endo/Heme/Allergies: Negative.   Psychiatric/Behavioral: Negative.     Blood pressure 126/89, pulse 76, temperature 97.8 F (36.6 C), temperature source Oral, resp. rate 11, height _0  (1.88 m), weight 144.017 kg (317 lb 8 oz), SpO2 98 %. Physical Exam  Constitutional: He is oriented to person, place, and time. He appears well-developed and well-nourished.  HENT:  Head: Normocephalic and atraumatic.  Mouth/Throat: No oropharyngeal exudate.  Eyes: Conjunctivae and EOM are normal. Pupils are equal, round, and reactive to light. No scleral icterus.  Neck: Normal range of motion. Neck supple. No JVD present. No tracheal deviation present. No thyromegaly present.  Cardiovascular: Normal rate and regular rhythm.  Exam reveals no gallop and no friction rub.   No murmur heard. Respiratory: He is in respiratory distress. He has wheezes. He has no rales. He exhibits no tenderness.  GI: Soft. Bowel sounds are normal. He exhibits no distension. There is no tenderness. There is no rebound and no guarding.  Musculoskeletal: Normal range of motion. He exhibits no edema or tenderness.  Lymphadenopathy:    He has no cervical adenopathy.  Neurological: He is alert and oriented to person, place, and time. He has normal reflexes. He displays normal reflexes. No cranial nerve deficit. He exhibits normal muscle tone. Coordination normal.  Skin: Skin is warm and dry. No rash noted. No erythema. No pallor.  Psychiatric: He has a normal mood and affect. His behavior is normal. Judgment and thought content normal.     Assessment/Plan Dyspnea secondary to Copd exacerbation Solumedrol 44m iv q8h spiriva 1puff qday Albuterol neb 1 neb po q6h and q6h prn levaquin 5031miv qday  Hyperglycemia Check  hga1c  Tobacco use Pt counselled for about 49m43mtes on smoking cessation.  Nicotine patch  Polycythemia Likely secodnary to tobacco use.   CP Start aspirin [id number] qday, Lipitor 80mg18mqhs Please expand data base in am  To find out results of cardiac cath  DVT prophylaxis :  Scd, and lovenox  Maurice Ramseur 05/23/2014, 10:01 PM

## 2014-05-24 DIAGNOSIS — R0600 Dyspnea, unspecified: Secondary | ICD-10-CM

## 2014-05-24 LAB — COMPREHENSIVE METABOLIC PANEL
ALT: 20 U/L (ref 0–53)
AST: 27 U/L (ref 0–37)
Albumin: 3.3 g/dL — ABNORMAL LOW (ref 3.5–5.2)
Alkaline Phosphatase: 93 U/L (ref 39–117)
Anion gap: 9 (ref 5–15)
BUN: 18 mg/dL (ref 6–23)
CO2: 24 mmol/L (ref 19–32)
Calcium: 8.6 mg/dL (ref 8.4–10.5)
Chloride: 102 mmol/L (ref 96–112)
Creatinine, Ser: 1.25 mg/dL (ref 0.50–1.35)
GFR calc Af Amer: 70 mL/min — ABNORMAL LOW (ref >90)
GFR calc non Af Amer: 60 mL/min — ABNORMAL LOW (ref >90)
Glucose, Bld: 246 mg/dL — ABNORMAL HIGH (ref 70–99)
Potassium: 4.5 mmol/L (ref 3.5–5.1)
Sodium: 135 mmol/L (ref 135–145)
Total Bilirubin: 0.5 mg/dL (ref 0.3–1.2)
Total Protein: 6.4 g/dL (ref 6.0–8.3)

## 2014-05-24 LAB — LIPID PANEL
Cholesterol: 230 mg/dL — ABNORMAL HIGH (ref 0–200)
HDL: 30 mg/dL — ABNORMAL LOW (ref >39)
LDL Cholesterol: 174 mg/dL — ABNORMAL HIGH (ref 0–99)
Total CHOL/HDL Ratio: 7.7 RATIO
Triglycerides: 131 mg/dL (ref <150)
VLDL: 26 mg/dL (ref 0–40)

## 2014-05-24 LAB — CBC
HCT: 48.6 % (ref 39.0–52.0)
Hemoglobin: 16.4 g/dL (ref 13.0–17.0)
MCH: 31.9 pg (ref 26.0–34.0)
MCHC: 33.7 g/dL (ref 30.0–36.0)
MCV: 94.6 fL (ref 78.0–100.0)
Platelets: 242 10*3/uL (ref 150–400)
RBC: 5.14 MIL/uL (ref 4.22–5.81)
RDW: 14 % (ref 11.5–15.5)
WBC: 11.8 10*3/uL — ABNORMAL HIGH (ref 4.0–10.5)

## 2014-05-24 LAB — TROPONIN I
Troponin I: <0.03 ng/mL (ref <0.031)
Troponin I: <0.03 ng/mL (ref <0.031)

## 2014-05-24 MED ORDER — METHYLPREDNISOLONE SODIUM SUCC 125 MG IJ SOLR
60.0000 mg | Freq: Two times a day (BID) | INTRAMUSCULAR | Status: DC
  Administered 2014-05-24 – 2014-05-25 (×2): 60 mg via INTRAVENOUS
  Filled 2014-05-24 (×4): qty 0.96

## 2014-05-24 MED ORDER — IPRATROPIUM-ALBUTEROL 0.5-2.5 (3) MG/3ML IN SOLN
3.0000 mL | RESPIRATORY_TRACT | Status: DC
  Administered 2014-05-24 – 2014-05-25 (×7): 3 mL via RESPIRATORY_TRACT
  Filled 2014-05-24 (×6): qty 3

## 2014-05-24 NOTE — Progress Notes (Signed)
RT Note: Pt staes he does wear CPAP at home w 2L o2 but does not want to wear one of ours. I told him to call if he changes his mine. R Twill continue to monitor

## 2014-05-24 NOTE — Progress Notes (Addendum)
[facility]                                                                              Patient Demographics  Mario [name], is a 64 y.o. male, DOB - 07-20-1950, [id number]  Admit date - 05/23/2014   Admitting Physician Jani Gravel, MD  Outpatient Primary MD for the patient is PROVID[facility] NOT [place]  LOS - 1   Chief Complaint  Patient presents with  . Chest Pain      HPI on 05/23/2014 by Dr. Jani Gravel 64 yo male with Copd not on home o2, OSA, Tobacco use, apparently c/o dry cough and increase in sob today and therefore presented to ED. Pt was wheezing and pox 78% on ra while walking. CXR negative. Pt noted slight chest pain substernal with radiation to neck , pt states that he had a cardiac cath about 2 months ago at the [facility]. Pt will be admitted for Copd exacerbation.   Assessment & Plan   Dyspnea secondary to COPD exacerbation -Chest x-ray: No active disease -Continue Solu-Medrol, nebulizer treatments, Levaquin, Spiriva, supplemental oxygen to maintain saturations above 92% -Patient continues to smoke -Currently afebrile, leukocytosis trending downward  Chest pain -Currently chest pain free -Continue aspirin, statin -Patient had cardiac catheterization approximately 3 weeks ago at [place] -pending receipt of records -Troponin negative x3  Hyperlipidemia -TC 2:30, TG 131, HDL 30, LDL 174 -Continue statin  Continued tobacco abuse -Patient counseled on smoking cessation -nicotine patch  Obstructive sleep apnea -Continue CPAP and supplemental oxygen  Polycythemia -Secondary to tobacco abuse -Hb trending downward  Hyperglycemia -Hemoglobin A1c pending -Currently complicated by Solu-Medrol -Has no history of diabetes -Will place on insulin sliding scale with CBG monitoring  Code Status: Full  Family Communication: None at bedside  Disposition Plan: Admitted  Time Spent in minutes   30 minutes  Procedures  None  Consults    None  DVT Prophylaxis  Lovenox  Lab Results  Component Value Date   PLT 242 05/24/2014    Medications  Scheduled Meds: . amitriptyline  20 mg Oral QHS  . ARIPiprazole  5 mg Oral Daily  . aspirin EC  325 mg Oral Daily  . atorvastatin  80 mg Oral q1800  . buPROPion  150 mg Oral BID  . DULoxetine  30 mg Oral Daily  . enoxaparin (LOVENOX) injection  70 mg Subcutaneous Q24H  . gabapentin  300 mg Oral BID  . gi cocktail  30 mL Oral Once  . ipratropium-albuterol  3 mL Nebulization Q4H  . levofloxacin (LEVAQUIN) IV  500 mg Intravenous Q24H  . lisinopril  20 mg Oral Daily  . methylPREDNISolone (SOLU-MEDROL) injection  80 mg Intravenous 3 times per day  . nicotine  21 mg Transdermal Daily  . pantoprazole  40 mg Oral Daily  . sodium chloride  3 mL Intravenous Q12H  . sodium chloride  3 mL Intravenous Q12H  . tiotropium  18 mcg Inhalation Daily   Continuous Infusions:  PRN Meds:.sodium chloride, acetaminophen **OR** acetaminophen, albuterol, ipratropium-albuterol, meclizine, ondansetron (ZOFRAN) IV, sodium chloride, sodium chloride  Antibiotics    Anti-infectives    Start     Dose/Rate Route Frequency Ordered  Stop   05/24/14 0000  levofloxacin (LEVAQUIN) IVPB 500 mg     500 mg 100 mL/hr over 60 Minutes Intravenous Every 24 hours 05/23/14 2355        Subjective:   Mario [name] seen and examined today.  Patient feels his shortness of breath has improved slightly. Denies any current chest pain. Denies any abdominal pain, nausea, vomiting, diarrhea.  Objective:   Filed Vitals:   05/24/14 0347 05/24/14 0516 05/24/14 0834 05/24/14 1042  BP:  124/70  115/67  Pulse:  10  106  Temp:  97.7 F (36.5 C)  97.8 F (36.6 C)  TempSrc:  Oral  Oral  Resp:  16  18  Height:      Weight:  142.293 kg (313 lb 11.2 oz)    SpO2: 91% 90% 91% 96%    Wt Readings from Last 3 Encounters:  05/24/14 142.293 kg (313 lb 11.2 oz)  01/08/14 136.079 kg (300 lb)  05/18/13 128.822 kg (284 lb)      Intake/Output Summary (Last 24 hours) at 05/24/14 1118 Last data filed at 05/24/14 0815  Gross per 24 hour  Intake    300 ml  Output      0 ml  Net    300 ml    Exam  General: Well developed, well nourished, NAD  HEENT: NCAT,  mucous membranes moist.   Cardiovascular: S1 S2 auscultated, RRR, no murmurs  Respiratory: Slight expiratory wheezing noted, diminished breath sounds  Abdomen: Soft, obese, nontender, nondistended, + bowel sounds  Extremities: warm dry without cyanosis clubbing or edema  Neuro: AAOx3, nonfocal  Psych: Normal affect and demeanor with intact judgement and insight  Data Review   Micro Results No results found for this or any previous visit (from the past 240 hour(s)).  Radiology Reports Dg Chest 2 View  05/23/2014   CLINICAL DATA:  Chest pain, COPD  EXAM: CHEST  2 VIEW  COMPARISON:  05/18/2013  FINDINGS: Cardiomediastinal silhouette is stable. No acute infiltrate or pleural effusion. No pulmonary edema. Hyperinflation again noted. There is linear atelectasis or scarring in lingula. Mild degenerative changes thoracic spine.  IMPRESSION: No active disease. Hyperinflation again noted. Linear atelectasis or scarring in lingula.   Electronically Signed   By: Lahoma Crocker M.D.   On: 05/23/2014 20:11    CBC  Recent Labs Lab 05/23/14 1858 05/24/14 0408  WBC 12.6* 11.8*  HGB 17.2* 16.4  HCT 50.1 48.6  PLT 234 242  MCV 94.4 94.6  MCH 32.4 31.9  MCHC 34.3 33.7  RDW 13.8 14.0    Chemistries   Recent Labs Lab 05/23/14 1858 05/24/14 0408  NA 135 135  K 4.3 4.5  CL 104 102  CO2 24 24  GLUCOSE 171* 246*  BUN 17 18  CREATININE 1.16 1.25  CALCIUM 8.7 8.6  AST  --  27  ALT  --  20  ALKPHOS  --  93  BILITOT  --  0.5   ------------------------------------------------------------------------------------------------------------------ estimated creatinine clearance is 92 mL/min (by C-G formula based on Cr of  1.25). ------------------------------------------------------------------------------------------------------------------ No results for input(s): HGBA1C in the last 72 hours. ------------------------------------------------------------------------------------------------------------------  Recent Labs  05/24/14 0408  CHOL 230*  HDL 30*  LDLCALC 174*  TRIG 131  CHOLHDL 7.7   ------------------------------------------------------------------------------------------------------------------ No results for input(s): TSH, T4TOTAL, T3FREE, THYROIDAB in the last 72 hours.  Invalid input(s): FREET3 ------------------------------------------------------------------------------------------------------------------ No results for input(s): VITAMINB12, FOLATE, FERRITIN, TIBC, IRON, RETICCTPCT in the last 72 hours.  Coagulation profile No results  for input(s): INR, PROTIME in the last 168 hours.  No results for input(s): DDIMER in the last 72 hours.  Cardiac Enzymes  Recent Labs Lab 05/23/14 2224 05/24/14 0408 05/24/14 0945  TROPONINI <0.03 <0.03 <0.03   ------------------------------------------------------------------------------------------------------------------ Invalid input(s): POCBNP    Mario [name] D.O. on 05/24/2014 at 11:18 AM  Between 7am to 7pm - Pager - [phone]  After 7pm go to [email or web address] - password [email or web address]  And look for the night coverage person covering for me after hours  [facility] Office  [phone]

## 2014-05-25 LAB — BASIC METABOLIC PANEL
Anion gap: 8 (ref 5–15)
BUN: 16 mg/dL (ref 6–23)
CO2: 24 mmol/L (ref 19–32)
Calcium: 8.7 mg/dL (ref 8.4–10.5)
Chloride: 103 mmol/L (ref 96–112)
Creatinine, Ser: 1.07 mg/dL (ref 0.50–1.35)
GFR calc Af Amer: 84 mL/min — ABNORMAL LOW (ref >90)
GFR calc non Af Amer: 72 mL/min — ABNORMAL LOW (ref >90)
Glucose, Bld: 171 mg/dL — ABNORMAL HIGH (ref 70–99)
Potassium: 4.5 mmol/L (ref 3.5–5.1)
Sodium: 135 mmol/L (ref 135–145)

## 2014-05-25 LAB — CBC
HCT: 46.1 % (ref 39.0–52.0)
Hemoglobin: 15.3 g/dL (ref 13.0–17.0)
MCH: 31.5 pg (ref 26.0–34.0)
MCHC: 33.2 g/dL (ref 30.0–36.0)
MCV: 95.1 fL (ref 78.0–100.0)
Platelets: 254 10*3/uL (ref 150–400)
RBC: 4.85 MIL/uL (ref 4.22–5.81)
RDW: 14 % (ref 11.5–15.5)
WBC: 19.7 10*3/uL — ABNORMAL HIGH (ref 4.0–10.5)

## 2014-05-25 MED ORDER — METHYLPREDNISOLONE SODIUM SUCC 125 MG IJ SOLR
60.0000 mg | Freq: Every day | INTRAMUSCULAR | Status: DC
  Administered 2014-05-26: 60 mg via INTRAVENOUS
  Filled 2014-05-25: qty 0.96

## 2014-05-25 MED ORDER — LEVOFLOXACIN 500 MG PO TABS
500.0000 mg | ORAL_TABLET | Freq: Every day | ORAL | Status: DC
  Administered 2014-05-25 – 2014-05-26 (×2): 500 mg via ORAL
  Filled 2014-05-25 (×2): qty 1

## 2014-05-25 MED ORDER — IPRATROPIUM-ALBUTEROL 0.5-2.5 (3) MG/3ML IN SOLN
3.0000 mL | Freq: Four times a day (QID) | RESPIRATORY_TRACT | Status: DC
  Administered 2014-05-25 (×2): 3 mL via RESPIRATORY_TRACT
  Filled 2014-05-25 (×3): qty 3

## 2014-05-25 NOTE — Progress Notes (Signed)
[facility]                                                                              Patient Demographics  Mario [name], is a 64 y.o. male, DOB - 1950-09-23, [id number]  Admit date - 05/23/2014   Admitting Physician Jani Gravel, MD  Outpatient Primary MD for the patient is PROVIDER NOT [place]  LOS - 2   Chief Complaint  Patient presents with  . Chest Pain      HPI on 05/23/2014 by Dr. Jani Gravel 64 yo male with Copd not on home o2, OSA, Tobacco use, apparently c/o dry cough and increase in sob today and therefore presented to ED. Pt was wheezing and pox 78% on ra while walking. CXR negative. Pt noted slight chest pain substernal with radiation to neck , pt states that he had a cardiac cath about 2 months ago at the [facility]. Pt will be admitted for Copd exacerbation.   Assessment & Plan   Dyspnea secondary to COPD exacerbation -Dyspnea improving as per patient, however, continues to wheeze -Chest x-ray: No active disease -Continue Solu-Medrol, nebulizer treatments, Levaquin, Spiriva, supplemental oxygen to maintain saturations above 92% -Patient continues to smoke -Currently afebrile -Will decrease solumedrol   Chest pain -Currently chest pain free -Continue aspirin, statin -Patient had cardiac catheterization approximately 3 weeks ago at [facility] hospital -pending receipt of records -Troponin negative x3  Hyperlipidemia -TC 2:30, TG 131, HDL 30, LDL 174 -Continue statin  Continued tobacco abuse -Patient counseled on smoking cessation -nicotine patch  Obstructive sleep apnea -Continue CPAP and supplemental oxygen  Polycythemia -Secondary to tobacco abuse -Hb trending downward  Hyperglycemia -Hemoglobin A1c pending -Currently complicated by Solu-Medrol -Has no history of diabetes -Will place on insulin sliding scale with CBG monitoring  Leukoyctosis -Likely secondary to steroids -Will continue to monitor CBC and reduce solumedrol -Patient  afebrile, CXR negative for infection, no urinary burning/pain  Code Status: Full  Family Communication: None at bedside  Disposition Plan: Admitted, possible d/c 3/28.   Time Spent in minutes   30 minutes  Procedures  None  Consults   None  DVT Prophylaxis  Lovenox  Lab Results  Component Value Date   PLT 254 05/25/2014    Medications  Scheduled Meds: . amitriptyline  20 mg Oral QHS  . ARIPiprazole  5 mg Oral Daily  . aspirin EC  325 mg Oral Daily  . atorvastatin  80 mg Oral q1800  . buPROPion  150 mg Oral BID  . DULoxetine  30 mg Oral Daily  . enoxaparin (LOVENOX) injection  70 mg Subcutaneous Q24H  . gabapentin  300 mg Oral BID  . gi cocktail  30 mL Oral Once  . ipratropium-albuterol  3 mL Nebulization QID  . levofloxacin (LEVAQUIN) IV  500 mg Intravenous Q24H  . lisinopril  20 mg Oral Daily  . methylPREDNISolone (SOLU-MEDROL) injection  60 mg Intravenous Q12H  . nicotine  21 mg Transdermal Daily  . pantoprazole  40 mg Oral Daily  . sodium chloride  3 mL Intravenous Q12H  . sodium chloride  3 mL Intravenous Q12H  . tiotropium  18 mcg Inhalation Daily   Continuous Infusions:  PRN Meds:.sodium chloride, acetaminophen **OR** acetaminophen, albuterol, ipratropium-albuterol, meclizine, ondansetron (ZOFRAN) IV, sodium chloride, sodium chloride  Antibiotics    Anti-infectives    Start     Dose/Rate Route Frequency Ordered Stop   05/24/14 0000  levofloxacin (LEVAQUIN) IVPB 500 mg     500 mg 100 mL/hr over 60 Minutes Intravenous Every 24 hours 05/23/14 2355        Subjective:   Mario [name] seen and examined today.  Patient feels his shortness of breath has improved slightly.  He continues to wheeze.  Denies chest pain, abdominal pain, nausea, vomiting, diarrhea.  Objective:   Filed Vitals:   05/24/14 2242 05/25/14 0451 05/25/14 1005 05/25/14 1028  BP: 111/65 121/71  131/68  Pulse: 103 106  88  Temp: 97.9 F (36.6 C) 98.2 F (36.8 C)  97.7 F (36.5 C)   TempSrc: Oral Oral  Oral  Resp: 21 21  20   Height:      Weight:  142.112 kg (313 lb 4.8 oz)    SpO2: 91% 94% 94% 94%    Wt Readings from Last 3 Encounters:  05/25/14 142.112 kg (313 lb 4.8 oz)  01/08/14 136.079 kg (300 lb)  05/18/13 128.822 kg (284 lb)     Intake/Output Summary (Last 24 hours) at 05/25/14 1227 Last data filed at 05/25/14 1013  Gross per 24 hour  Intake    360 ml  Output      0 ml  Net    360 ml    Exam  General: Well developed, well nourished, NAD  Cardiovascular: S1 S2 auscultated, RRR, no murmurs  Respiratory: Diminished breath sounds, +exp wheezing  Abdomen: Soft, obese, nontender, nondistended, + bowel sounds  Extremities: warm dry without cyanosis clubbing or edema  Data Review   Micro Results No results found for this or any previous visit (from the past 240 hour(s)).  Radiology Reports Dg Chest 2 View  05/23/2014   CLINICAL DATA:  Chest pain, COPD  EXAM: CHEST  2 VIEW  COMPARISON:  05/18/2013  FINDINGS: Cardiomediastinal silhouette is stable. No acute infiltrate or pleural effusion. No pulmonary edema. Hyperinflation again noted. There is linear atelectasis or scarring in lingula. Mild degenerative changes thoracic spine.  IMPRESSION: No active disease. Hyperinflation again noted. Linear atelectasis or scarring in lingula.   Electronically Signed   By: Lahoma Crocker M.D.   On: 05/23/2014 20:11    CBC  Recent Labs Lab 05/23/14 1858 05/24/14 0408 05/25/14 0447  WBC 12.6* 11.8* 19.7*  HGB 17.2* 16.4 15.3  HCT 50.1 48.6 46.1  PLT 234 242 254  MCV 94.4 94.6 95.1  MCH 32.4 31.9 31.5  MCHC 34.3 33.7 33.2  RDW 13.8 14.0 14.0    Chemistries   Recent Labs Lab 05/23/14 1858 05/24/14 0408 05/25/14 0447  NA 135 135 135  K 4.3 4.5 4.5  CL 104 102 103  CO2 24 24 24   GLUCOSE 171* 246* 171*  BUN 17 18 16   CREATININE 1.16 1.25 1.07  CALCIUM 8.7 8.6 8.7  AST  --  27  --   ALT  --  20  --   ALKPHOS  --  93  --   BILITOT  --  0.5  --      ------------------------------------------------------------------------------------------------------------------ estimated creatinine clearance is 107.5 mL/min (by C-G formula based on Cr of 1.07). ------------------------------------------------------------------------------------------------------------------ No results for input(s): HGBA1C in the last 72 hours. ------------------------------------------------------------------------------------------------------------------  Recent Labs  05/24/14 0408  CHOL 230*  HDL 30*  LDLCALC 174*  TRIG 131  CHOLHDL 7.7   ------------------------------------------------------------------------------------------------------------------ No results for input(s): TSH, T4TOTAL, T3FREE, THYROIDAB in the last 72 hours.  Invalid input(s): FREET3 ------------------------------------------------------------------------------------------------------------------ No results for input(s): VITAMINB12, FOLATE, FERRITIN, TIBC, IRON, RETICCTPCT in the last 72 hours.  Coagulation profile No results for input(s): INR, PROTIME in the last 168 hours.  No results for input(s): DDIMER in the last 72 hours.  Cardiac Enzymes  Recent Labs Lab 05/23/14 2224 05/24/14 0408 05/24/14 0945  TROPONINI <0.03 <0.03 <0.03   ------------------------------------------------------------------------------------------------------------------ Invalid input(s): POCBNP    Teigan Sahli D.O. on 05/25/2014 at 12:27 PM  Between 7am to 7pm - Pager - [phone]  After 7pm go to [email or web address] - password [email or web address]  And look for the night coverage person covering for me after hours  [facility] Office  [phone]

## 2014-05-25 NOTE — Progress Notes (Signed)
Utilization review completed.  

## 2014-05-26 LAB — HEMOGLOBIN A1C
Hgb A1c MFr Bld: 6.1 % — ABNORMAL HIGH (ref 4.8–5.6)
Mean Plasma Glucose: 128 mg/dL

## 2014-05-26 LAB — CBC
HCT: 49 % (ref 39.0–52.0)
Hemoglobin: 16 g/dL (ref 13.0–17.0)
MCH: 31.3 pg (ref 26.0–34.0)
MCHC: 32.7 g/dL (ref 30.0–36.0)
MCV: 95.9 fL (ref 78.0–100.0)
Platelets: 217 10*3/uL (ref 150–400)
RBC: 5.11 MIL/uL (ref 4.22–5.81)
RDW: 14.1 % (ref 11.5–15.5)
WBC: 14.3 10*3/uL — ABNORMAL HIGH (ref 4.0–10.5)

## 2014-05-26 MED ORDER — TIOTROPIUM BROMIDE MONOHYDRATE 18 MCG IN CAPS: 18.0000 ug | ORAL_CAPSULE | Freq: Every day | RESPIRATORY_TRACT | Status: DC

## 2014-05-26 MED ORDER — ATORVASTATIN CALCIUM 20 MG PO TABS: 20.0000 mg | ORAL_TABLET | Freq: Every day | ORAL | Status: DC

## 2014-05-26 MED ORDER — PREDNISONE 10 MG PO TABS: ORAL_TABLET | ORAL | Status: DC

## 2014-05-26 MED ORDER — PANTOPRAZOLE SODIUM 40 MG PO TBEC: 40.0000 mg | DELAYED_RELEASE_TABLET | Freq: Every day | ORAL | Status: DC

## 2014-05-26 MED ORDER — NICOTINE 21 MG/24HR TD PT24
21.0000 mg | MEDICATED_PATCH | Freq: Every day | TRANSDERMAL | Status: DC
Start: 2014-05-26 — End: 2014-07-21

## 2014-05-26 MED ORDER — LEVOFLOXACIN 500 MG PO TABS: 500.0000 mg | ORAL_TABLET | Freq: Every day | ORAL | Status: DC

## 2014-05-26 NOTE — Discharge Summary (Addendum)
Physician Discharge Summary  Mario [name] IZT:[id number] DOB: Jul 28, 1950 DOA: 05/23/2014  PCP: PROVIDER NOT IN SYSTEM  Admit date: 05/23/2014 Discharge date: 05/26/2014  Time spent: 45 minutes  Recommendations for Outpatient Follow-up:  Patient will be discharged home. Continue his medications as prescribed. Patient to follow-up with his primary care physician at the [facility] hospital within one week of discharge. Patient should avoid tobacco products. Patient to continue a heart healthy diet.  Discharge Diagnoses:  Dyspnea secondary to COPD exacerbation Chest pain Hyperlipidemia continued tobacco abuse Obstructive sleep apnea Polycythemia hyperglycemia Leukocytosis  Discharge Condition: Stable  Diet recommendation: Heart healthy  Filed Weights   05/24/14 0516 05/25/14 0451 05/26/14 0431  Weight: 142.293 kg (313 lb 11.2 oz) 142.112 kg (313 lb 4.8 oz) 142.112 kg (313 lb 4.8 oz)    History of present illness:  on 05/23/2014 by Dr. Jani Gravel 64 yo male with Copd not on home o2, OSA, Tobacco use, apparently c/o dry cough and increase in sob today and therefore presented to ED. Pt was wheezing and pox 78% on ra while walking. CXR negative. Pt noted slight chest pain substernal with radiation to neck , pt states that he had a cardiac cath about 2 months ago at the [facility]. Pt will be admitted for Copd exacerbation.  Hospital Course:  Dyspnea secondary to COPD exacerbation -Dyspnea improving as per patient, however, continues to wheeze -Chest x-ray: No active disease -Continue Solu-Medrol, nebulizer treatments, Levaquin, Spiriva, supplemental oxygen to maintain saturations above 92% -Patient continues to smoke -Currently afebrile -Will decrease solumedrol   Chest pain -Currently chest pain free -Continue aspirin, statin -Patient had cardiac catheterization approximately 3 weeks ago at [facility] hospital -pending receipt of records -Troponin negative x3  Hyperlipidemia -TC 230, TG  131, HDL 30, LDL 174 -Continue statin  Continued tobacco abuse -Patient counseled on smoking cessation -nicotine patch  Obstructive sleep apnea -Continue CPAP and supplemental oxygen  Polycythemia -Secondary to tobacco abuse -Hb trending downward  Hyperglycemia -Hemoglobin A1c 6.1 -Currently complicated by Solu-Medrol -Has no history of diabetes -was placed on insulin sliding scale with CBG monitoring  Leukoyctosis -Likely secondary to steroids, WBC tredning downward -Patient afebrile, CXR negative for infection, no urinary burning/pain  Procedures  None  Consults  None  Discharge Exam: Filed Vitals:   05/26/14 0431  BP: 138/89  Pulse: 85  Temp: 97.8 F (36.6 C)  Resp: 20   Exam  General: Well developed, well nourished, NAD  Cardiovascular: S1 S2 auscultated, RRR, no murmurs  Respiratory: Diminished but clear breath sounds, few scattered exp wheezing  Abdomen: Soft, obese, nontender, nondistended, + bowel sounds  Extremities: warm dry without cyanosis clubbing or edema  Neuro: AAOx3, nonfocal  Psych: Normal affect and demeanor with intact judgement and insight  Discharge Instructions      Discharge Instructions    Discharge instructions    Complete by:  As directed   Patient will be discharged home. Continue his medications as prescribed. Patient to follow-up with his primary care physician within one week of discharge. Patient should avoid tobacco products. Patient to continue a heart healthy diet.            Medication List    TAKE these medications        acetaminophen 500 MG tablet  Commonly known as:  TYLENOL  Take 500 mg by mouth every 6 (six) hours as needed for moderate pain.     amitriptyline 10 MG tablet  Commonly known as:  ELAVIL  Take 20 mg  by mouth at bedtime.     ARIPiprazole 5 MG tablet  Commonly known as:  ABILIFY  Take 1 tablet (5 mg total) by mouth 2 (two) times daily at 8 am and 10 pm. For depression and anxiety.       aspirin 81 MG tablet  Take 81 mg by mouth daily.     atorvastatin 20 MG tablet  Commonly known as:  LIPITOR  Take 1 tablet (20 mg total) by mouth daily.     buPROPion 150 MG 12 hr tablet  Commonly known as:  WELLBUTRIN SR  Take 150 mg by mouth 2 (two) times daily.     CLEAR EYES OP  Place 1-2 drops into both eyes 2 (two) times daily as needed (DRY EYES).     COMBIVENT RESPIMAT 20-100 MCG/ACT Aers respimat  Generic drug:  Ipratropium-Albuterol  Inhale 1 puff into the lungs every 6 (six) hours as needed for wheezing.     DULoxetine 30 MG capsule  Commonly known as:  CYMBALTA  Take 30 mg by mouth daily.     flunisolide 25 MCG/ACT (0.025%) Soln  Commonly known as:  NASALIDE  Place 1 spray into both nostrils 2 (two) times daily as needed (STUFFY NOSE).     gabapentin 300 MG capsule  Commonly known as:  NEURONTIN  Take 300 mg by mouth 2 (two) times daily.     levofloxacin 500 MG tablet  Commonly known as:  LEVAQUIN  Take 1 tablet (500 mg total) by mouth daily.     lisinopril 20 MG tablet  Commonly known as:  PRINIVIL,ZESTRIL  Take 1 tablet (20 mg total) by mouth daily. For high blood pressure     meclizine 25 MG tablet  Commonly known as:  ANTIVERT  Take 1 tablet (25 mg total) by mouth 3 (three) times daily as needed for dizziness.     nicotine 21 mg/24hr patch  Commonly known as:  NICODERM CQ - dosed in mg/24 hours  Place 1 patch (21 mg total) onto the skin daily.     OVER THE COUNTER MEDICATION  Take 1 tablet by mouth as needed (for pain).     OVER THE COUNTER MEDICATION  Inhale 1 application into the lungs at bedtime. CPAP     pantoprazole 40 MG tablet  Commonly known as:  PROTONIX  Take 1 tablet (40 mg total) by mouth daily.     predniSONE 10 MG tablet  Commonly known as:  DELTASONE  Prednisone dosing: Take  Prednisone 40mg  (4 tabs) x 3 days, then taper to 30mg  (3 tabs) x 3 days, then 20mg  (2 tabs) x 3days, then 10mg  (1 tab) x 3days, then OFF.      sodium chloride 0.65 % Soln nasal spray  Commonly known as:  OCEAN  Place 2 sprays into both nostrils 4 (four) times daily as needed for congestion.     tiotropium 18 MCG inhalation capsule  Commonly known as:  SPIRIVA  Place 1 capsule (18 mcg total) into inhaler and inhale daily.     triamcinolone cream 0.1 %  Commonly known as:  KENALOG  Apply 1 application topically daily as needed (ITCHING).       Allergies  Allergen Reactions  . Demerol [Meperidine] Nausea And Vomiting    Violently sick  . Zocor [Simvastatin] Other (See Comments)    Made him very jittery, nausea and vomiting   Follow-up Information    Follow up with Primary care physician. Schedule an appointment as soon as  possible for a visit in 1 week.   Why:  Hospital follow up       The results of significant diagnostics from this hospitalization (including imaging, microbiology, ancillary and laboratory) are listed below for reference.    Significant Diagnostic Studies: Dg Chest 2 View  05/23/2014   CLINICAL DATA:  Chest pain, COPD  EXAM: CHEST  2 VIEW  COMPARISON:  05/18/2013  FINDINGS: Cardiomediastinal silhouette is stable. No acute infiltrate or pleural effusion. No pulmonary edema. Hyperinflation again noted. There is linear atelectasis or scarring in lingula. Mild degenerative changes thoracic spine.  IMPRESSION: No active disease. Hyperinflation again noted. Linear atelectasis or scarring in lingula.   Electronically Signed   By: Lahoma Crocker M.D.   On: 05/23/2014 20:11    Microbiology: No results found for this or any previous visit (from the past 240 hour(s)).   Labs: Basic Metabolic Panel:  Recent Labs Lab 05/23/14 1858 05/24/14 0408 05/25/14 0447  NA 135 135 135  K 4.3 4.5 4.5  CL 104 102 103  CO2 24 24 24   GLUCOSE 171* 246* 171*  BUN 17 18 16   CREATININE 1.16 1.25 1.07  CALCIUM 8.7 8.6 8.7   Liver Function Tests:  Recent Labs Lab 05/24/14 0408  AST 27  ALT 20  ALKPHOS 93  BILITOT 0.5   PROT 6.4  ALBUMIN 3.3*   No results for input(s): LIPASE, AMYLASE in the last 168 hours. No results for input(s): AMMONIA in the last 168 hours. CBC:  Recent Labs Lab 05/23/14 1858 05/24/14 0408 05/25/14 0447 05/26/14 0605  WBC 12.6* 11.8* 19.7* 14.3*  HGB 17.2* 16.4 15.3 16.0  HCT 50.1 48.6 46.1 49.0  MCV 94.4 94.6 95.1 95.9  PLT 234 242 254 217   Cardiac Enzymes:  Recent Labs Lab 05/23/14 2224 05/24/14 0408 05/24/14 0945  TROPONINI <0.03 <0.03 <0.03   BNP: BNP (last 3 results)  Recent Labs  05/23/14 1858  BNP 20.3    ProBNP (last 3 results) No results for input(s): PROBNP in the last 8760 hours.  CBG: No results for input(s): GLUCAP in the last 168 hours.     SignedCristal Ford  [facility] 05/26/2014, 10:41 AM

## 2014-05-26 NOTE — Progress Notes (Signed)
Discharge instructions gave to pt and all questions answered. Pt is ready to discharge.

## 2014-05-26 NOTE — Discharge Instructions (Signed)

## 2014-06-22 ENCOUNTER — Encounter (HOSPITAL_COMMUNITY): Payer: Self-pay | Admitting: Physician Assistant

## 2014-07-21 ENCOUNTER — Emergency Department (HOSPITAL_COMMUNITY): Payer: Non-veteran care

## 2014-07-21 ENCOUNTER — Encounter (HOSPITAL_COMMUNITY): Payer: Self-pay | Admitting: Family Medicine

## 2014-07-21 ENCOUNTER — Emergency Department (HOSPITAL_COMMUNITY)
Admission: EM | Admit: 2014-07-21 | Discharge: 2014-07-21 | Disposition: A | Discharge status: Home / Self Care | Payer: Non-veteran care | Attending: Emergency Medicine | Admitting: Emergency Medicine

## 2014-07-21 DIAGNOSIS — J441 Chronic obstructive pulmonary disease with (acute) exacerbation: Secondary | ICD-10-CM | POA: Diagnosis not present

## 2014-07-21 DIAGNOSIS — R0602 Shortness of breath: Secondary | ICD-10-CM

## 2014-07-21 DIAGNOSIS — Z7951 Long term (current) use of inhaled steroids: Secondary | ICD-10-CM | POA: Diagnosis not present

## 2014-07-21 DIAGNOSIS — I10 Essential (primary) hypertension: Secondary | ICD-10-CM | POA: Insufficient documentation

## 2014-07-21 DIAGNOSIS — Z79899 Other long term (current) drug therapy: Secondary | ICD-10-CM | POA: Diagnosis not present

## 2014-07-21 DIAGNOSIS — Z7982 Long term (current) use of aspirin: Secondary | ICD-10-CM | POA: Diagnosis not present

## 2014-07-21 DIAGNOSIS — R079 Chest pain, unspecified: Secondary | ICD-10-CM | POA: Diagnosis present

## 2014-07-21 DIAGNOSIS — R42 Dizziness and giddiness: Secondary | ICD-10-CM | POA: Diagnosis not present

## 2014-07-21 DIAGNOSIS — Z720 Tobacco use: Secondary | ICD-10-CM | POA: Insufficient documentation

## 2014-07-21 DIAGNOSIS — Z8669 Personal history of other diseases of the nervous system and sense organs: Secondary | ICD-10-CM | POA: Diagnosis not present

## 2014-07-21 DIAGNOSIS — I252 Old myocardial infarction: Secondary | ICD-10-CM | POA: Diagnosis not present

## 2014-07-21 DIAGNOSIS — Z792 Long term (current) use of antibiotics: Secondary | ICD-10-CM | POA: Insufficient documentation

## 2014-07-21 DIAGNOSIS — R55 Syncope and collapse: Secondary | ICD-10-CM | POA: Diagnosis not present

## 2014-07-21 LAB — BRAIN NATRIURETIC PEPTIDE: B Natriuretic Peptide: 11.7 pg/mL (ref 0.0–100.0)

## 2014-07-21 LAB — CBC
HCT: 48.7 % (ref 39.0–52.0)
Hemoglobin: 16.7 g/dL (ref 13.0–17.0)
MCH: 32.5 pg (ref 26.0–34.0)
MCHC: 34.3 g/dL (ref 30.0–36.0)
MCV: 94.7 fL (ref 78.0–100.0)
Platelets: 256 10*3/uL (ref 150–400)
RBC: 5.14 MIL/uL (ref 4.22–5.81)
RDW: 14.2 % (ref 11.5–15.5)
WBC: 10.2 10*3/uL (ref 4.0–10.5)

## 2014-07-21 LAB — I-STAT TROPONIN, ED
Troponin i, poc: 0.01 ng/mL (ref 0.00–0.08)
Troponin i, poc: 0 ng/mL (ref 0.00–0.08)

## 2014-07-21 LAB — BASIC METABOLIC PANEL
Anion gap: 11 (ref 5–15)
BUN: 8 mg/dL (ref 6–20)
CO2: 25 mmol/L (ref 22–32)
Calcium: 9 mg/dL (ref 8.9–10.3)
Chloride: 101 mmol/L (ref 101–111)
Creatinine, Ser: 1.13 mg/dL (ref 0.61–1.24)
GFR calc Af Amer: >60 mL/min (ref >60)
GFR calc non Af Amer: >60 mL/min (ref >60)
Glucose, Bld: 87 mg/dL (ref 65–99)
Potassium: 4.4 mmol/L (ref 3.5–5.1)
Sodium: 137 mmol/L (ref 135–145)

## 2014-07-21 MED ORDER — DULOXETINE HCL 30 MG PO CPEP: 30.0000 mg | ORAL_CAPSULE | Freq: Every day | ORAL | Status: DC

## 2014-07-21 MED ORDER — IPRATROPIUM-ALBUTEROL 0.5-2.5 (3) MG/3ML IN SOLN
3.0000 mL | RESPIRATORY_TRACT | Status: DC
  Administered 2014-07-21: 3 mL via RESPIRATORY_TRACT
  Filled 2014-07-21: qty 3

## 2014-07-21 MED ORDER — HEPARIN SOD (PORK) LOCK FLUSH 100 UNIT/ML IV SOLN: 500.0000 [IU] | Freq: Once | INTRAVENOUS | Status: DC

## 2014-07-21 MED ORDER — PREDNISONE 20 MG PO TABS: ORAL_TABLET | ORAL | Status: DC

## 2014-07-21 MED ORDER — PREDNISONE 20 MG PO TABS
60.0000 mg | ORAL_TABLET | Freq: Once | ORAL | Status: AC
  Administered 2014-07-21: 60 mg via ORAL
  Filled 2014-07-21: qty 3

## 2014-07-21 MED ORDER — ARIPIPRAZOLE 5 MG PO TABS: 5.0000 mg | ORAL_TABLET | Freq: Every day | ORAL | Status: DC

## 2014-07-21 NOTE — ED Notes (Signed)
Pt here for chest pain, SOB that started today. sts intermittent. sts some dry eyes and blurred vision.

## 2014-07-21 NOTE — ED Notes (Signed)
MRI approximately 1 hour until patient taken for scan, 18:15.

## 2014-07-21 NOTE — ED Notes (Signed)
Pt. Left with all belongings 

## 2014-07-21 NOTE — Discharge Instructions (Signed)
°Chest Pain (Nonspecific) °It is often hard to give a specific diagnosis for the cause of chest pain. There is always a chance that your pain could be related to something serious, such as a heart attack or a blood clot in the lungs. You need to follow up with your health care provider for further evaluation. °CAUSES  °· Heartburn. °· Pneumonia or bronchitis. °· Anxiety or stress. °· Inflammation around your heart (pericarditis) or lung (pleuritis or pleurisy). °· A blood clot in the lung. °· A collapsed lung (pneumothorax). It can develop suddenly on its own (spontaneous pneumothorax) or from trauma to the chest. °· Shingles infection (herpes zoster virus). °The chest wall is composed of bones, muscles, and cartilage. Any of these can be the source of the pain. °· The bones can be bruised by injury. °· The muscles or cartilage can be strained by coughing or overwork. °· The cartilage can be affected by inflammation and become sore (costochondritis). °DIAGNOSIS  °Lab tests or other studies may be needed to find the cause of your pain. Your health care provider may have you take a test called an ambulatory electrocardiogram (ECG). An ECG records your heartbeat patterns over a 24-hour period. You may also have other tests, such as: °· Transthoracic echocardiogram (TTE). During echocardiography, sound waves are used to evaluate how blood flows through your heart. °· Transesophageal echocardiogram (TEE). °· Cardiac monitoring. This allows your health care provider to monitor your heart rate and rhythm in real time. °· Holter monitor. This is a portable device that records your heartbeat and can help diagnose heart arrhythmias. It allows your health care provider to track your heart activity for several days, if needed. °· Stress tests by exercise or by giving medicine that makes the heart beat faster. °TREATMENT  °· Treatment depends on what may be causing your chest pain. Treatment may include: °· Acid blockers for  heartburn. °· Anti-inflammatory medicine. °· Pain medicine for inflammatory conditions. °· Antibiotics if an infection is present. °· You may be advised to change lifestyle habits. This includes stopping smoking and avoiding alcohol, caffeine, and chocolate. °· You may be advised to keep your head raised (elevated) when sleeping. This reduces the chance of acid going backward from your stomach into your esophagus. °Most of the time, nonspecific chest pain will improve within 2-3 days with rest and mild pain medicine.  °HOME CARE INSTRUCTIONS  °· If antibiotics were prescribed, take them as directed. Finish them even if you start to feel better. °· For the next few days, avoid physical activities that bring on chest pain. Continue physical activities as directed. °· Do not use any tobacco products, including cigarettes, chewing tobacco, or electronic cigarettes. °· Avoid drinking alcohol. °· Only take medicine as directed by your health care provider. °· Follow your health care provider's suggestions for further testing if your chest pain does not go away. °· Keep any follow-up appointments you made. If you do not go to an appointment, you could develop lasting (chronic) problems with pain. If there is any problem keeping an appointment, call to reschedule. °SEEK MEDICAL CARE IF:  °· Your chest pain does not go away, even after treatment. °· You have a rash with blisters on your chest. °· You have a fever. °SEEK IMMEDIATE MEDICAL CARE IF:  °· You have increased chest pain or pain that spreads to your arm, neck, jaw, back, or abdomen. °· You have shortness of breath. °· You have an increasing cough, or you cough   up blood. °· You have severe back or abdominal pain. °· You feel nauseous or vomit. °· You have severe weakness. °· You faint. °· You have chills. °This is an emergency. Do not wait to see if the pain will go away. Get medical help at once. Call your local emergency services (911 in [place] Do not drive  yourself to the hospital. °MAKE SURE YOU:  °· Understand these instructions. °· Will watch your condition. °· Will get help right away if you are not doing well or get worse. °Document Released: 11/24/2004 Document Revised: 02/19/2013 Document Reviewed: 09/20/2007 °[employer]® Patient Information ©2015 [employer]. This information is not intended to replace advice given to you by your health care provider. Make sure you discuss any questions you have with your health care provider. ° ° °Dizziness °Dizziness is a common problem. It is a feeling of unsteadiness or light-headedness. You may feel like you are about to faint. Dizziness can lead to injury if you stumble or fall. A person of any age group can suffer from dizziness, but dizziness is more common in older adults. °CAUSES  °Dizziness can be caused by many different things, including: °· Middle ear problems. °· Standing for too long. °· Infections. °· An allergic reaction. °· Aging. °· An emotional response to something, such as the sight of blood. °· Side effects of medicines. °· Tiredness. °· Problems with circulation or blood pressure. °· Excessive use of alcohol or medicines, or illegal drug use. °· Breathing too fast (hyperventilation). °· An irregular heart rhythm (arrhythmia). °· A low red blood cell count (anemia). °· Pregnancy. °· Vomiting, diarrhea, fever, or other illnesses that cause body fluid loss (dehydration). °· Diseases or conditions such as Parkinson's disease, high blood pressure (hypertension), diabetes, and thyroid problems. °· Exposure to extreme heat. °DIAGNOSIS  °Your health care provider will ask about your symptoms, perform a physical exam, and perform an electrocardiogram (ECG) to record the electrical activity of your heart. Your health care provider may also perform other heart or blood tests to determine the cause of your dizziness. These may include: °· Transthoracic echocardiogram (TTE). During echocardiography, sound waves are  used to evaluate how blood flows through your heart. °· Transesophageal echocardiogram (TEE). °· Cardiac monitoring. This allows your health care provider to monitor your heart rate and rhythm in real time. °· Holter monitor. This is a portable device that records your heartbeat and can help diagnose heart arrhythmias. It allows your health care provider to track your heart activity for several days if needed. °· Stress tests by exercise or by giving medicine that makes the heart beat faster. °TREATMENT  °Treatment of dizziness depends on the cause of your symptoms and can vary greatly. °HOME CARE INSTRUCTIONS  °· Drink enough fluids to keep your urine clear or pale yellow. This is especially important in very hot weather. In older adults, it is also important in cold weather. °· Take your medicine exactly as directed if your dizziness is caused by medicines. When taking blood pressure medicines, it is especially important to get up slowly. °¨ Rise slowly from chairs and steady yourself until you feel okay. °¨ In the morning, first sit up on the side of the bed. When you feel okay, stand slowly while holding onto something until you know your balance is fine. °· Move your legs often if you need to stand in one place for a long time. Tighten and relax your muscles in your legs while standing. °· Have someone stay   with you for 1-2 days if dizziness continues to be a problem. Do this until you feel you are well enough to stay alone. Have the person call your health care provider if he or she notices changes in you that are concerning. °· Do not drive or use heavy machinery if you feel dizzy. °· Do not drink alcohol. °SEEK IMMEDIATE MEDICAL CARE IF:  °· Your dizziness or light-headedness gets worse. °· You feel nauseous or vomit. °· You have problems talking, walking, or using your arms, hands, or legs. °· You feel weak. °· You are not thinking clearly or you have trouble forming sentences. It may take a friend or  family member to notice this. °· You have chest pain, abdominal pain, shortness of breath, or sweating. °· Your vision changes. °· You notice any bleeding. °· You have side effects from medicine that seems to be getting worse rather than better. °MAKE SURE YOU:  °· Understand these instructions. °· Will watch your condition. °· Will get help right away if you are not doing well or get worse. °Document Released: 08/10/2000 Document Revised: 02/19/2013 Document Reviewed: 09/03/2010 °[employer]® Patient Information ©2015 [employer]. This information is not intended to replace advice given to you by your health care provider. Make sure you discuss any questions you have with your health care provider. ° ° °

## 2014-07-21 NOTE — ED Provider Notes (Signed)
CSN: [id number]     Arrival date & time 07/21/14  1405 History   First MD Initiated Contact with Patient 07/21/14 1517     Chief Complaint  Patient presents with  . Chest Pain     (Consider location/radiation/quality/duration/timing/severity/associated sxs/prior Treatment) Patient is a 64 y.o. male presenting with chest pain. The history is provided by the patient.  Chest Pain Pain location:  Substernal area Pain quality: burning and pressure   Pain radiates to:  Does not radiate Pain radiates to the back: no   Pain severity:  Mild Onset quality:  Gradual Duration:  4 hours Timing:  Intermittent Progression:  Unchanged Chronicity:  Recurrent Context: at rest   Context: not breathing and no drug use   Relieved by:  Nothing Worsened by:  Nothing tried Associated symptoms: dizziness (for past week, described as light-headedness), shortness of breath and syncope (no true syncope, however had multiple episodes of near-syncope while at rest today)   Associated symptoms: no abdominal pain, no fever, no nausea and not vomiting     Past Medical History  Diagnosis Date  . Hypertension   . Bronchitis   . Anxiety   . Mental disorder   . Depression   . Suicide attempt   . MI (myocardial infarction)     ????  . COPD (chronic obstructive pulmonary disease)   . OSA (obstructive sleep apnea)    Past Surgical History  Procedure Laterality Date  . Skin cancer excision    . Cryptorchidism     Family History  Problem Relation Age of Onset  . Dementia Father    History  Substance Use Topics  . Smoking status: Current Every Day Smoker -- 0.50 packs/day for 20 years    Types: Cigarettes  . Smokeless tobacco: Never Used  . Alcohol Use: No     Comment: denies use of any drugs or alcohol    Review of Systems  Constitutional: Negative for fever and chills.  Respiratory: Positive for shortness of breath.   Cardiovascular: Positive for chest pain and syncope (no true syncope,  however had multiple episodes of near-syncope while at rest today).  Gastrointestinal: Negative for nausea, vomiting and abdominal pain.  Neurological: Positive for dizziness (for past week, described as light-headedness).  All other systems reviewed and are negative.     Allergies  Demerol and Zocor  Home Medications   Prior to Admission medications   Medication Sig Start Date End Date Taking? Authorizing Provider  acetaminophen (TYLENOL) 500 MG tablet Take 500 mg by mouth every 6 (six) hours as needed for moderate pain.    Historical Provider, MD  amitriptyline (ELAVIL) 10 MG tablet Take 20 mg by mouth at bedtime.    Historical Provider, MD  ARIPiprazole (ABILIFY) 5 MG tablet Take 1 tablet (5 mg total) by mouth 2 (two) times daily at 8 am and 10 pm. For depression and anxiety. Patient taking differently: Take 5 mg by mouth daily. For depression and anxiety. 01/21/13   Ruben Im, PA-C  aspirin 81 MG tablet Take 81 mg by mouth daily.    Historical Provider, MD  atorvastatin (LIPITOR) 20 MG tablet Take 1 tablet (20 mg total) by mouth daily. 05/26/14   Maryann Mikhail, DO  buPROPion (WELLBUTRIN SR) 150 MG 12 hr tablet Take 150 mg by mouth 2 (two) times daily.    Historical Provider, MD  DULoxetine (CYMBALTA) 30 MG capsule Take 30 mg by mouth daily.    Historical Provider, MD  flunisolide (NASALIDE)  25 MCG/ACT (0.025%) SOLN Place 1 spray into both nostrils 2 (two) times daily as needed (STUFFY NOSE).    Historical Provider, MD  gabapentin (NEURONTIN) 300 MG capsule Take 300 mg by mouth 2 (two) times daily.    Historical Provider, MD  Ipratropium-Albuterol (COMBIVENT RESPIMAT) 20-100 MCG/ACT AERS respimat Inhale 1 puff into the lungs every 6 (six) hours as needed for wheezing.    Historical Provider, MD  levofloxacin (LEVAQUIN) 500 MG tablet Take 1 tablet (500 mg total) by mouth daily. 05/26/14   Maryann Mikhail, DO  lisinopril (PRINIVIL,ZESTRIL) 20 MG tablet Take 1 tablet (20 mg total) by  mouth daily. For high blood pressure 01/21/13   Ruben Im, PA-C  meclizine (ANTIVERT) 25 MG tablet Take 1 tablet (25 mg total) by mouth 3 (three) times daily as needed for dizziness. 01/08/14   Noland Fordyce, PA-C  Naphazoline HCl (CLEAR EYES OP) Place 1-2 drops into both eyes 2 (two) times daily as needed (DRY EYES).    Historical Provider, MD  nicotine (NICODERM CQ - DOSED IN MG/24 HOURS) 21 mg/24hr patch Place 1 patch (21 mg total) onto the skin daily. 05/26/14   Maryann Mikhail, DO  OVER THE COUNTER MEDICATION Take 1 tablet by mouth as needed (for pain).    Historical Provider, MD  OVER THE COUNTER MEDICATION Inhale 1 application into the lungs at bedtime. CPAP    Historical Provider, MD  pantoprazole (PROTONIX) 40 MG tablet Take 1 tablet (40 mg total) by mouth daily. 05/26/14   Maryann Mikhail, DO  predniSONE (DELTASONE) 10 MG tablet Prednisone dosing: Take  Prednisone '40mg'$  (4 tabs) x 3 days, then taper to '30mg'$  (3 tabs) x 3 days, then '20mg'$  (2 tabs) x 3days, then '10mg'$  (1 tab) x 3days, then OFF. 05/26/14   Maryann Mikhail, DO  sodium chloride (OCEAN) 0.65 % SOLN nasal spray Place 2 sprays into both nostrils 4 (four) times daily as needed for congestion.    Historical Provider, MD  tiotropium (SPIRIVA) 18 MCG inhalation capsule Place 1 capsule (18 mcg total) into inhaler and inhale daily. 05/26/14   Maryann Mikhail, DO  triamcinolone cream (KENALOG) 0.1 % Apply 1 application topically daily as needed (ITCHING).    Historical Provider, MD   BP 127/77 mmHg  Pulse 84  Temp(Src) 97.4 F (36.3 C)  Resp 18  SpO2 96% Physical Exam  Constitutional: He is oriented to person, place, and time. He appears well-developed and well-nourished. No distress.  HENT:  Head: Normocephalic and atraumatic.  Mouth/Throat: No oropharyngeal exudate.  Eyes: EOM are normal. Pupils are equal, round, and reactive to light.  Neck: Normal range of motion. Neck supple.  Cardiovascular: Normal rate and regular rhythm.   Exam reveals no friction rub.   No murmur heard. Pulmonary/Chest: Effort normal. No respiratory distress. He has decreased breath sounds (diffusely). He has wheezes (mild, diffuse). He has no rales.  Abdominal: He exhibits no distension. There is no tenderness. There is no rebound.  Musculoskeletal: Normal range of motion. He exhibits no edema.  Neurological: He is alert and oriented to person, place, and time.  Skin: He is not diaphoretic.  Nursing note and vitals reviewed.   ED Course  Procedures (including critical care time) Labs Review Labs Reviewed  CBC  BASIC METABOLIC PANEL  BRAIN NATRIURETIC PEPTIDE  I-STAT [place], ED    Imaging Review Dg Chest 2 View  07/21/2014   CLINICAL DATA:  Central chest pain with weakness which began 2 days ago  EXAM: CHEST  2 VIEW  COMPARISON:  05/23/2014  FINDINGS: Heart size and vascular pattern are normal. Hyperinflation suggests COPD. Mild lower lobe atelectasis similar to prior study. No evidence of pneumonia edema effusion or pneumothorax.  IMPRESSION: COPD with no acute findings.   Electronically Signed   By: Skipper Cliche M.D.   On: 07/21/2014 15:47     EKG Interpretation   Date/Time:  Monday Jul 21 2014 14:10:06 EDT Ventricular Rate:  101 PR Interval:  188 QRS Duration: 98 QT Interval:  330 QTC Calculation: 427 R Axis:   -6 Text Interpretation:  Sinus tachycardia Otherwise normal ECG No signficant  change since last tracing Confirmed by Mingo Amber  MD, [place] ([id number]) on  07/21/2014 3:18:41 PM      MDM   Final diagnoses:  Shortness of breath  Dizziness    64 year old male here with chest pain and shortness of breath. CP has been intermittent for past 5 hours, and it began while he was taking a nap. Does not radiate. Has had this before. 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 Mild associated SOB. No cough or fever. He also reports one week  of constant dizziness described as light-headedness, headaches, and nausea. One week ago he also ran out of his duloxetine and Abilify. He states occasionally he feels off balance with the dizziness, but none currently. Here vitals are stable. He is neurologically intact. On exam he does have some decreased breath sounds with some wheezing. Will give steroids injection and start breathing treatments. With normal neuro exam, will check an MRI, but I suspect his symptoms are likely due to withdrawal of his antidepressant and Abilify  MR normal. Serial troponins normal. Given refill of his cymbalta and abilify as his symptoms are likely due to being out of these meds.  Evelina Bucy, MD 07/22/14 [phone]

## 2015-10-10 ENCOUNTER — Encounter (HOSPITAL_COMMUNITY): Payer: Self-pay | Admitting: *Deleted

## 2015-10-10 ENCOUNTER — Emergency Department (HOSPITAL_COMMUNITY)
Admission: EM | Admit: 2015-10-10 | Discharge: 2015-10-10 | Disposition: A | Discharge status: Home / Self Care | Payer: Non-veteran care | Attending: Emergency Medicine | Admitting: Emergency Medicine

## 2015-10-10 DIAGNOSIS — I10 Essential (primary) hypertension: Secondary | ICD-10-CM | POA: Insufficient documentation

## 2015-10-10 DIAGNOSIS — Y999 Unspecified external cause status: Secondary | ICD-10-CM | POA: Insufficient documentation

## 2015-10-10 DIAGNOSIS — S3992XA Unspecified injury of lower back, initial encounter: Secondary | ICD-10-CM | POA: Diagnosis present

## 2015-10-10 DIAGNOSIS — J449 Chronic obstructive pulmonary disease, unspecified: Secondary | ICD-10-CM | POA: Insufficient documentation

## 2015-10-10 DIAGNOSIS — Y929 Unspecified place or not applicable: Secondary | ICD-10-CM | POA: Diagnosis not present

## 2015-10-10 DIAGNOSIS — Z85828 Personal history of other malignant neoplasm of skin: Secondary | ICD-10-CM | POA: Insufficient documentation

## 2015-10-10 DIAGNOSIS — Y939 Activity, unspecified: Secondary | ICD-10-CM | POA: Diagnosis not present

## 2015-10-10 DIAGNOSIS — Z79899 Other long term (current) drug therapy: Secondary | ICD-10-CM | POA: Diagnosis not present

## 2015-10-10 DIAGNOSIS — Z7982 Long term (current) use of aspirin: Secondary | ICD-10-CM | POA: Insufficient documentation

## 2015-10-10 DIAGNOSIS — X58XXXA Exposure to other specified factors, initial encounter: Secondary | ICD-10-CM | POA: Diagnosis not present

## 2015-10-10 DIAGNOSIS — S39012A Strain of muscle, fascia and tendon of lower back, initial encounter: Secondary | ICD-10-CM | POA: Diagnosis not present

## 2015-10-10 DIAGNOSIS — I252 Old myocardial infarction: Secondary | ICD-10-CM | POA: Diagnosis not present

## 2015-10-10 DIAGNOSIS — F17210 Nicotine dependence, cigarettes, uncomplicated: Secondary | ICD-10-CM | POA: Diagnosis not present

## 2015-10-10 MED ORDER — KETOROLAC TROMETHAMINE 60 MG/2ML IM SOLN
60.0000 mg | Freq: Once | INTRAMUSCULAR | Status: AC
  Administered 2015-10-10: 60 mg via INTRAMUSCULAR
  Filled 2015-10-10: qty 2

## 2015-10-10 NOTE — ED Provider Notes (Signed)
[facility] DEPT Provider Note   CSN: [id number] Arrival date & time: 10/10/15  4034  First Provider Contact:    First MD Initiated Contact with Patient 10/10/15 1948      By signing my name below, I, Mario [name], attest that this documentation has been prepared under the direction and in the presence of non-physician practitioner, Domenic Moras, PA-C Electronically Signed: Dolores [name], Scribe. 10/10/2015. 7:42 PM.   History   Chief Complaint Chief Complaint  Patient presents with  . Back Pain   The history is provided by the patient. No language interpreter was used.     HPI Comments:  Mario [name] is a 65 y.o. male with PMHx of chronic back pain who presents to the Emergency Department complaining of worsening sharp, stabbing right sided lower back pain that worsened 4 days ago. Pt reports that he was in the shower when he lost balance and jerked. He indicates the symptoms are similar to when he first hurt his back when he was 29. Pt indicates he has been compliant with all medications, which includes gabapentin, metholcarbamol, and ibuprofen. He states he saw his PCP earlier this week where he had his gabapentin dose increased and was also started on metholcarbamol with no relief. Pt reports symptoms are worsened by movement. No alleviating factors noted. Pt reports he saw his rheumatologist last week.  Pt denies any bowel/bladder incontinence, numbness in extremities, fever, or vomiting. He denies a history of cancer. 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    Past Medical History:  Diagnosis Date  . Anxiety   . Bronchitis   . COPD (chronic obstructive pulmonary disease) ([place])   . Depression   . Hypertension   . Mental disorder   . MI (myocardial infarction) ([place])    ????  . OSA (obstructive sleep apnea)   . Suicide attempt [facility])     Patient Active Problem List   Diagnosis Date Noted  . Dyspnea 05/23/2014  . COPD exacerbation ([place])  05/23/2014  . Malingering 01/21/2013  . MDD (major depressive disorder), recurrent severe, without psychosis ([place]) 01/13/2013  . Depression 01/11/2013  . Tobacco abuse 01/11/2013  . Obesity, unspecified 01/11/2013  . Old MI (myocardial infarction) 01/11/2013  . Chest pain 01/10/2013  . HTN (hypertension) 01/10/2013    Past Surgical History:  Procedure Laterality Date  . cryptorchidism    . SKIN CANCER EXCISION       Home Medications    Prior to Admission medications   Medication Sig Start Date End Date Taking? Authorizing Provider  acetaminophen (TYLENOL) 500 MG tablet Take 500 mg by mouth every 6 (six) hours as needed for moderate pain.    Historical Provider, MD  amitriptyline (ELAVIL) 10 MG tablet Take 20 mg by mouth at bedtime.    Historical Provider, MD  ARIPiprazole (ABILIFY) 5 MG tablet Take 1 tablet (5 mg total) by mouth daily. For depression and anxiety. 07/21/14   Evelina Bucy, MD  aspirin 81 MG tablet Take 81 mg by mouth daily.    Historical Provider, MD  atorvastatin (LIPITOR) 20 MG tablet Take 1 tablet (20 mg total) by mouth daily. Patient not taking: Reported on 07/21/2014 05/26/14   Velta Addison Mikhail, DO  buPROPion [facility] SR) 150 MG 12 hr tablet Take 150 mg by mouth 2 (two) times daily.    Historical Provider, MD  DULoxetine (CYMBALTA) 30 MG capsule Take 1 capsule (30 mg total) by mouth daily. 07/21/14  Evelina Bucy, MD  flunisolide (NASALIDE) 25 MCG/ACT (0.025%) SOLN Place 1 spray into both nostrils 2 (two) times daily as needed (STUFFY NOSE).    Historical Provider, MD  ibuprofen (ADVIL,MOTRIN) 600 MG tablet Take 600 mg by mouth 2 (two) times daily.    Historical Provider, MD  Ipratropium-Albuterol (COMBIVENT RESPIMAT) 20-100 MCG/ACT AERS respimat Inhale 1 puff into the lungs every 6 (six) hours as needed for wheezing.    Historical Provider, MD  lisinopril (PRINIVIL,ZESTRIL) 20 MG tablet Take 1 tablet (20 mg total) by mouth daily. For high blood pressure 01/21/13    Ruben Im, PA-C  meclizine (ANTIVERT) 25 MG tablet Take 1 tablet (25 mg total) by mouth 3 (three) times daily as needed for dizziness. 01/08/14   Noland Fordyce, PA-C  Naphazoline HCl (CLEAR EYES OP) Place 1-2 drops into both eyes 2 (two) times daily as needed (DRY EYES).    Historical Provider, MD  pantoprazole (PROTONIX) 40 MG tablet Take 1 tablet (40 mg total) by mouth daily. Patient not taking: Reported on 07/21/2014 05/26/14   Velta Addison Mikhail, DO  predniSONE (DELTASONE) 20 MG tablet 3 tabs po day one, then 2 po daily x 4 days 07/21/14   Evelina Bucy, MD  sodium chloride (OCEAN) 0.65 % SOLN nasal spray Place 2 sprays into both nostrils 4 (four) times daily as needed for congestion.    Historical Provider, MD  tiotropium (SPIRIVA) 18 MCG inhalation capsule Place 1 capsule (18 mcg total) into inhaler and inhale daily. 05/26/14   Maryann Mikhail, DO  triamcinolone cream (KENALOG) 0.1 % Apply 1 application topically daily as needed (ITCHING).    Historical Provider, MD    Family History Family History  Problem Relation Age of Onset  . Dementia Father     Social History Social History  Substance Use Topics  . Smoking status: Current Every Day Smoker    Packs/day: 0.50    Years: 20.00    Types: Cigarettes  . Smokeless tobacco: Never Used  . Alcohol use No     Comment: denies use of any drugs or alcohol     Allergies   Demerol [meperidine] and Zocor [simvastatin]   Review of Systems Review of Systems  Constitutional: Negative for fever.  Gastrointestinal: Negative for vomiting.  Musculoskeletal: Positive for back pain.  Neurological: Negative for numbness.     Physical Exam Updated Vital Signs BP 135/88 (BP Location: Right Arm)   Pulse 111   Temp 98.3 F (36.8 C) (Oral)   Resp 16   Ht '6\' 2"'$  (1.88 m)   Wt (!) 316 lb 2 oz (143.4 kg)   SpO2 92%   BMI 40.59 kg/m   Physical Exam  Constitutional: He is oriented to person, place, and time. He appears well-developed and  well-nourished. No distress.  HENT:  Head: Normocephalic and atraumatic.  Eyes: Conjunctivae are normal.  Cardiovascular: Normal rate.   Pulmonary/Chest: Effort normal.  Abdominal: He exhibits no distension.  Musculoskeletal: He exhibits tenderness.  Tenderness noted to lumbar spine at the level of L5-S1 and right lumbar paraspinal muscle on palpation without any overlying skin changes. Back ROM limited secondary to pain. No Crepitus or step-offs. Patellar tendon reflexes intact. Able to ambulate with cane.  Neurological: He is alert and oriented to person, place, and time.  Skin: Skin is warm and dry.  Psychiatric: He has a normal mood and affect.  Nursing note and vitals reviewed.    ED Treatments / Results  DIAGNOSTIC STUDIES:  Oxygen Saturation  is 92% on RA, low by my interpretation.    COORDINATION OF CARE:  7:58 PM Discussed treatment plan with pt at bedside which included gabapentin injection and referral to PCP and pt agreed to plan.   Labs (all labs ordered are listed, but only abnormal results are displayed) Labs Reviewed - No data to display  EKG  EKG Interpretation None       Radiology No results found.  Procedures Procedures (including critical care time)  Medications Ordered in ED Medications  ketorolac (TORADOL) injection 60 mg (60 mg Intramuscular Given 10/10/15 2013)     Initial Impression / Assessment and Plan / ED Course  I have reviewed the triage vital signs and the nursing notes.  Pertinent labs & imaging results that were available during my care of the patient were reviewed by me and considered in my medical decision making (see chart for details).  Clinical Course   Patient with back pain.  No neurological deficits and normal neuro exam.  Patient is ambulatory.  No loss of bowel or bladder control.  No concern for cauda equina.  No fever, night sweats, weight loss, h/o cancer, IVDA, no recent procedure to back. No urinary symptoms  suggestive of UTI.  Supportive care and return precaution discussed. Appears safe for discharge at this time. Follow up as indicated in discharge paperwork.   Final Clinical Impressions(s) / ED Diagnoses   Final diagnoses:  Low back strain, initial encounter    New Prescriptions New Prescriptions   No medications on file  I personally performed the services described in this documentation, which was scribed in my presence. The recorded information has been reviewed and is accurate.      Domenic Moras, PA-C 10/11/15 [place], MD 10/11/15 [phone]

## 2015-10-10 NOTE — ED Notes (Signed)
PA-C at bedside, discussing pain management with patient.
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
## 2015-10-10 NOTE — Discharge Instructions (Signed)
Please continue to take ibuprofen, methocarbamol and gabapentin previously prescribed to help with your lower back strain. Follow up with orthopedist for further management as needed.

## 2015-10-10 NOTE — ED Notes (Signed)
Patient verbalized understanding of discharge instructions and denies any further needs or questions at this time. VS stable. Patient ambulatory with steady gait, using cane. Declined wheelchair, RN escorted to ED entrance.

## 2016-06-27 ENCOUNTER — Inpatient Hospital Stay (HOSPITAL_COMMUNITY)
Admission: EM | Admit: 2016-06-27 | Discharge: 2016-07-07 | DRG: 199 | Disposition: A | Discharge status: Home / Self Care | Payer: Medicare Other | Attending: Cardiothoracic Surgery | Admitting: Cardiothoracic Surgery

## 2016-06-27 ENCOUNTER — Encounter (HOSPITAL_COMMUNITY): Payer: Self-pay | Admitting: Emergency Medicine

## 2016-06-27 ENCOUNTER — Emergency Department (HOSPITAL_COMMUNITY): Payer: Medicare Other

## 2016-06-27 DIAGNOSIS — J930 Spontaneous tension pneumothorax: Secondary | ICD-10-CM | POA: Diagnosis present

## 2016-06-27 DIAGNOSIS — J939 Pneumothorax, unspecified: Secondary | ICD-10-CM

## 2016-06-27 DIAGNOSIS — G4733 Obstructive sleep apnea (adult) (pediatric): Secondary | ICD-10-CM | POA: Diagnosis present

## 2016-06-27 DIAGNOSIS — J441 Chronic obstructive pulmonary disease with (acute) exacerbation: Secondary | ICD-10-CM | POA: Diagnosis not present

## 2016-06-27 DIAGNOSIS — J9601 Acute respiratory failure with hypoxia: Secondary | ICD-10-CM | POA: Diagnosis present

## 2016-06-27 DIAGNOSIS — J9383 Other pneumothorax: Secondary | ICD-10-CM | POA: Diagnosis present

## 2016-06-27 DIAGNOSIS — F17210 Nicotine dependence, cigarettes, uncomplicated: Secondary | ICD-10-CM | POA: Diagnosis present

## 2016-06-27 DIAGNOSIS — F329 Major depressive disorder, single episode, unspecified: Secondary | ICD-10-CM | POA: Diagnosis present

## 2016-06-27 DIAGNOSIS — I252 Old myocardial infarction: Secondary | ICD-10-CM

## 2016-06-27 DIAGNOSIS — Z4682 Encounter for fitting and adjustment of non-vascular catheter: Secondary | ICD-10-CM

## 2016-06-27 DIAGNOSIS — R079 Chest pain, unspecified: Secondary | ICD-10-CM | POA: Diagnosis present

## 2016-06-27 DIAGNOSIS — Z7982 Long term (current) use of aspirin: Secondary | ICD-10-CM

## 2016-06-27 DIAGNOSIS — Z85828 Personal history of other malignant neoplasm of skin: Secondary | ICD-10-CM

## 2016-06-27 DIAGNOSIS — Z9989 Dependence on other enabling machines and devices: Secondary | ICD-10-CM | POA: Diagnosis not present

## 2016-06-27 DIAGNOSIS — J449 Chronic obstructive pulmonary disease, unspecified: Secondary | ICD-10-CM | POA: Diagnosis present

## 2016-06-27 DIAGNOSIS — R0902 Hypoxemia: Secondary | ICD-10-CM | POA: Diagnosis not present

## 2016-06-27 DIAGNOSIS — M069 Rheumatoid arthritis, unspecified: Secondary | ICD-10-CM | POA: Diagnosis present

## 2016-06-27 DIAGNOSIS — J9311 Primary spontaneous pneumothorax: Secondary | ICD-10-CM

## 2016-06-27 DIAGNOSIS — Z79899 Other long term (current) drug therapy: Secondary | ICD-10-CM

## 2016-06-27 DIAGNOSIS — Z9689 Presence of other specified functional implants: Secondary | ICD-10-CM

## 2016-06-27 DIAGNOSIS — J9811 Atelectasis: Secondary | ICD-10-CM | POA: Diagnosis present

## 2016-06-27 DIAGNOSIS — Z9981 Dependence on supplemental oxygen: Secondary | ICD-10-CM | POA: Diagnosis not present

## 2016-06-27 DIAGNOSIS — G8929 Other chronic pain: Secondary | ICD-10-CM | POA: Diagnosis present

## 2016-06-27 DIAGNOSIS — F419 Anxiety disorder, unspecified: Secondary | ICD-10-CM | POA: Diagnosis present

## 2016-06-27 DIAGNOSIS — I10 Essential (primary) hypertension: Secondary | ICD-10-CM | POA: Diagnosis present

## 2016-06-27 HISTORY — DX: Spontaneous tension pneumothorax: J93.0

## 2016-06-27 HISTORY — PX: CHEST TUBE INSERTION: SHX231

## 2016-06-27 LAB — COMPREHENSIVE METABOLIC PANEL
ALT: 75 U/L — ABNORMAL HIGH (ref 17–63)
AST: 50 U/L — ABNORMAL HIGH (ref 15–41)
Albumin: 3.9 g/dL (ref 3.5–5.0)
Alkaline Phosphatase: 76 U/L (ref 38–126)
Anion gap: 8 (ref 5–15)
BUN: 13 mg/dL (ref 6–20)
CO2: 24 mmol/L (ref 22–32)
Calcium: 8.5 mg/dL — ABNORMAL LOW (ref 8.9–10.3)
Chloride: 105 mmol/L (ref 101–111)
Creatinine, Ser: 1.13 mg/dL (ref 0.61–1.24)
GFR calc Af Amer: >60 mL/min (ref >60)
GFR calc non Af Amer: >60 mL/min (ref >60)
Glucose, Bld: 150 mg/dL — ABNORMAL HIGH (ref 65–99)
Potassium: 4.5 mmol/L (ref 3.5–5.1)
Sodium: 137 mmol/L (ref 135–145)
Total Bilirubin: 0.6 mg/dL (ref 0.3–1.2)
Total Protein: 6.9 g/dL (ref 6.5–8.1)

## 2016-06-27 LAB — CBC WITH DIFFERENTIAL/PLATELET
Basophils Absolute: 0 10*3/uL (ref 0.0–0.1)
Basophils Relative: 0 %
Eosinophils Absolute: 0 10*3/uL (ref 0.0–0.7)
Eosinophils Relative: 0 %
HCT: 51.7 % (ref 39.0–52.0)
Hemoglobin: 17.6 g/dL — ABNORMAL HIGH (ref 13.0–17.0)
Lymphocytes Relative: 23 %
Lymphs Abs: 3 10*3/uL (ref 0.7–4.0)
MCH: 33.8 pg (ref 26.0–34.0)
MCHC: 34 g/dL (ref 30.0–36.0)
MCV: 99.2 fL (ref 78.0–100.0)
Monocytes Absolute: 0.4 10*3/uL (ref 0.1–1.0)
Monocytes Relative: 3 %
Neutro Abs: 9.4 10*3/uL — ABNORMAL HIGH (ref 1.7–7.7)
Neutrophils Relative %: 74 %
Platelets: 246 10*3/uL (ref 150–400)
RBC: 5.21 MIL/uL (ref 4.22–5.81)
RDW: 14.8 % (ref 11.5–15.5)
WBC: 12.8 10*3/uL — ABNORMAL HIGH (ref 4.0–10.5)

## 2016-06-27 LAB — I-STAT TROPONIN, ED: Troponin i, poc: 0 ng/mL (ref 0.00–0.08)

## 2016-06-27 LAB — CREATININE, SERUM
Creatinine, Ser: 1.34 mg/dL — ABNORMAL HIGH (ref 0.61–1.24)
GFR calc Af Amer: >60 mL/min (ref >60)
GFR calc non Af Amer: 54 mL/min — ABNORMAL LOW (ref >60)

## 2016-06-27 LAB — CBC
HCT: 51.4 % (ref 39.0–52.0)
Hemoglobin: 17.4 g/dL — ABNORMAL HIGH (ref 13.0–17.0)
MCH: 34 pg (ref 26.0–34.0)
MCHC: 33.9 g/dL (ref 30.0–36.0)
MCV: 100.4 fL — ABNORMAL HIGH (ref 78.0–100.0)
Platelets: 248 10*3/uL (ref 150–400)
RBC: 5.12 MIL/uL (ref 4.22–5.81)
RDW: 14.5 % (ref 11.5–15.5)
WBC: 15.8 10*3/uL — ABNORMAL HIGH (ref 4.0–10.5)

## 2016-06-27 LAB — D-DIMER, QUANTITATIVE: D-Dimer, Quant: 0.38 ug/mL-FEU (ref 0.00–0.50)

## 2016-06-27 MED ORDER — ONDANSETRON HCL 4 MG/2ML IJ SOLN
4.0000 mg | Freq: Once | INTRAMUSCULAR | Status: AC
  Administered 2016-06-27: 4 mg via INTRAVENOUS
  Filled 2016-06-27: qty 2

## 2016-06-27 MED ORDER — LIDOCAINE HCL (PF) 1 % IJ SOLN
0.0000 mL | Freq: Once | INTRAMUSCULAR | Status: DC | PRN
  Filled 2016-06-27: qty 30

## 2016-06-27 MED ORDER — OXYCODONE HCL 5 MG PO TABS
5.0000 mg | ORAL_TABLET | ORAL | Status: DC | PRN
  Administered 2016-06-27 – 2016-07-01 (×16): 5 mg via ORAL
  Filled 2016-06-27 (×17): qty 1

## 2016-06-27 MED ORDER — SODIUM CHLORIDE 0.9% FLUSH
3.0000 mL | Freq: Two times a day (BID) | INTRAVENOUS | Status: DC
  Administered 2016-06-27 – 2016-07-06 (×16): 3 mL via INTRAVENOUS

## 2016-06-27 MED ORDER — MAGNESIUM SULFATE 2 GM/50ML IV SOLN
2.0000 g | Freq: Once | INTRAVENOUS | Status: AC
  Administered 2016-06-27: 2 g via INTRAVENOUS
  Filled 2016-06-27: qty 50

## 2016-06-27 MED ORDER — TIOTROPIUM BROMIDE MONOHYDRATE 18 MCG IN CAPS
18.0000 ug | ORAL_CAPSULE | Freq: Every day | RESPIRATORY_TRACT | Status: DC
  Administered 2016-06-27 – 2016-07-07 (×9): 18 ug via RESPIRATORY_TRACT
  Filled 2016-06-27 (×3): qty 5

## 2016-06-27 MED ORDER — FENTANYL CITRATE (PF) 100 MCG/2ML IJ SOLN
25.0000 ug | INTRAMUSCULAR | Status: DC | PRN
  Administered 2016-06-27 – 2016-07-01 (×18): 50 ug via INTRAVENOUS
  Filled 2016-06-27 (×19): qty 2

## 2016-06-27 MED ORDER — PREDNISOLONE ACETATE 1 % OP SUSP
2.0000 [drp] | Freq: Four times a day (QID) | OPHTHALMIC | Status: DC
  Administered 2016-06-27 – 2016-07-07 (×36): 2 [drp] via OPHTHALMIC
  Filled 2016-06-27: qty 1

## 2016-06-27 MED ORDER — LIDOCAINE-EPINEPHRINE (PF) 2 %-1:200000 IJ SOLN
10.0000 mL | Freq: Once | INTRAMUSCULAR | Status: AC
  Administered 2016-06-27: 10 mL via INTRADERMAL
  Filled 2016-06-27: qty 20

## 2016-06-27 MED ORDER — TRAMADOL HCL 50 MG PO TABS
50.0000 mg | ORAL_TABLET | Freq: Four times a day (QID) | ORAL | Status: DC | PRN
  Administered 2016-06-27 – 2016-07-04 (×2): 50 mg via ORAL
  Filled 2016-06-27 (×3): qty 1

## 2016-06-27 MED ORDER — ARIPIPRAZOLE 5 MG PO TABS: 5.0000 mg | ORAL_TABLET | Freq: Every day | ORAL | Status: DC

## 2016-06-27 MED ORDER — LEVALBUTEROL HCL 0.63 MG/3ML IN NEBU
0.6300 mg | INHALATION_SOLUTION | Freq: Four times a day (QID) | RESPIRATORY_TRACT | Status: DC
  Administered 2016-06-27: 0.63 mg via RESPIRATORY_TRACT
  Filled 2016-06-27: qty 3

## 2016-06-27 MED ORDER — LEVALBUTEROL HCL 0.63 MG/3ML IN NEBU
0.6300 mg | INHALATION_SOLUTION | Freq: Three times a day (TID) | RESPIRATORY_TRACT | Status: DC
  Administered 2016-06-28 – 2016-07-07 (×28): 0.63 mg via RESPIRATORY_TRACT
  Filled 2016-06-27 (×29): qty 3

## 2016-06-27 MED ORDER — NITROGLYCERIN 2 % TD OINT
1.0000 [in_us] | TOPICAL_OINTMENT | Freq: Once | TRANSDERMAL | Status: AC
  Administered 2016-06-27: 1 [in_us] via TOPICAL
  Filled 2016-06-27: qty 1

## 2016-06-27 MED ORDER — NITROGLYCERIN 0.4 MG SL SUBL
0.4000 mg | SUBLINGUAL_TABLET | SUBLINGUAL | Status: DC | PRN
  Filled 2016-06-27: qty 1

## 2016-06-27 MED ORDER — PANTOPRAZOLE SODIUM 40 MG PO TBEC
40.0000 mg | DELAYED_RELEASE_TABLET | Freq: Every day | ORAL | Status: DC
  Administered 2016-06-27 – 2016-07-07 (×11): 40 mg via ORAL
  Filled 2016-06-27 (×11): qty 1

## 2016-06-27 MED ORDER — DOCUSATE SODIUM 100 MG PO CAPS
100.0000 mg | ORAL_CAPSULE | Freq: Every day | ORAL | Status: DC
  Administered 2016-06-27 – 2016-07-07 (×11): 100 mg via ORAL
  Filled 2016-06-27 (×11): qty 1

## 2016-06-27 MED ORDER — DULOXETINE HCL 30 MG PO CPEP
30.0000 mg | ORAL_CAPSULE | Freq: Every day | ORAL | Status: DC
  Administered 2016-06-27 – 2016-07-07 (×11): 30 mg via ORAL
  Filled 2016-06-27 (×11): qty 1

## 2016-06-27 MED ORDER — LISINOPRIL 10 MG PO TABS
20.0000 mg | ORAL_TABLET | Freq: Every day | ORAL | Status: DC
  Administered 2016-06-27: 20 mg via ORAL
  Filled 2016-06-27: qty 2

## 2016-06-27 MED ORDER — ENOXAPARIN SODIUM 40 MG/0.4ML ~~LOC~~ SOLN
40.0000 mg | SUBCUTANEOUS | Status: DC
  Administered 2016-06-27 – 2016-07-06 (×10): 40 mg via SUBCUTANEOUS
  Filled 2016-06-27 (×10): qty 0.4

## 2016-06-27 MED ORDER — ALBUTEROL (5 MG/ML) CONTINUOUS INHALATION SOLN
5.0000 mg/h | INHALATION_SOLUTION | RESPIRATORY_TRACT | Status: DC
  Administered 2016-06-27: 5 mg/h via RESPIRATORY_TRACT
  Filled 2016-06-27: qty 20

## 2016-06-27 MED ORDER — ONDANSETRON HCL 4 MG PO TABS: 4.0000 mg | ORAL_TABLET | Freq: Four times a day (QID) | ORAL | Status: DC | PRN

## 2016-06-27 MED ORDER — IPRATROPIUM BROMIDE 0.02 % IN SOLN
0.5000 mg | Freq: Once | RESPIRATORY_TRACT | Status: AC
  Administered 2016-06-27: 0.5 mg via RESPIRATORY_TRACT
  Filled 2016-06-27: qty 2.5

## 2016-06-27 MED ORDER — SODIUM CHLORIDE 0.9% FLUSH: 3.0000 mL | INTRAVENOUS | Status: DC | PRN

## 2016-06-27 MED ORDER — SODIUM CHLORIDE 0.9 % IV SOLN: 250.0000 mL | INTRAVENOUS | Status: DC | PRN

## 2016-06-27 MED ORDER — ACETAMINOPHEN 650 MG RE SUPP: 650.0000 mg | Freq: Four times a day (QID) | RECTAL | Status: DC | PRN

## 2016-06-27 MED ORDER — BUPROPION HCL ER (SR) 150 MG PO TB12
150.0000 mg | ORAL_TABLET | Freq: Two times a day (BID) | ORAL | Status: DC
  Administered 2016-06-27 – 2016-07-07 (×20): 150 mg via ORAL
  Filled 2016-06-27 (×21): qty 1

## 2016-06-27 MED ORDER — SODIUM CHLORIDE 0.9% FLUSH
3.0000 mL | Freq: Two times a day (BID) | INTRAVENOUS | Status: DC
  Administered 2016-06-27 – 2016-06-29 (×5): 3 mL via INTRAVENOUS

## 2016-06-27 MED ORDER — ONDANSETRON HCL 4 MG/2ML IJ SOLN: 4.0000 mg | Freq: Four times a day (QID) | INTRAMUSCULAR | Status: DC | PRN

## 2016-06-27 MED ORDER — ORAL CARE MOUTH RINSE
15.0000 mL | Freq: Two times a day (BID) | OROMUCOSAL | Status: DC
Start: 2016-06-27 — End: 2016-07-07
  Administered 2016-06-30 – 2016-07-06 (×8): 15 mL via OROMUCOSAL

## 2016-06-27 MED ORDER — ASPIRIN EC 81 MG PO TBEC
81.0000 mg | DELAYED_RELEASE_TABLET | Freq: Every day | ORAL | Status: DC
  Administered 2016-06-27 – 2016-07-07 (×11): 81 mg via ORAL
  Filled 2016-06-27 (×11): qty 1

## 2016-06-27 MED ORDER — ACETAMINOPHEN 325 MG PO TABS
650.0000 mg | ORAL_TABLET | Freq: Four times a day (QID) | ORAL | Status: DC | PRN
  Administered 2016-06-27 – 2016-07-02 (×2): 650 mg via ORAL
  Filled 2016-06-27 (×3): qty 2

## 2016-06-27 MED ORDER — AMITRIPTYLINE HCL 10 MG PO TABS
20.0000 mg | ORAL_TABLET | Freq: Every day | ORAL | Status: DC
  Administered 2016-06-27 – 2016-07-02 (×6): 20 mg via ORAL
  Filled 2016-06-27 (×6): qty 2

## 2016-06-27 MED ORDER — FENTANYL CITRATE (PF) 100 MCG/2ML IJ SOLN
100.0000 ug | Freq: Once | INTRAMUSCULAR | Status: AC
  Administered 2016-06-27: 100 ug via INTRAVENOUS
  Filled 2016-06-27: qty 2

## 2016-06-27 NOTE — ED Notes (Signed)
Cardio thoracic at bedside.

## 2016-06-27 NOTE — ED Triage Notes (Signed)
Pt arrives from home by [facility] that started suddenly this am. Pt reports substernal chest pain that is worse with movement. Pt reports hx of COPD. EMS gave '10mg'$  albuterol, '1mg'$  Atrovent, 125 soul medrol. 2 G magnesium IV en route. EMS reports improve lung sounds from diminished to audible wheezing. Pt has o2 sats on 83% on room air with ems arrival. Pt arrives to ED with o2 sat's 91% on neb treatment.

## 2016-06-27 NOTE — ED Notes (Signed)
When NRB removed, spo2 drops to 88%, dr Leonette Monarch notified and ordered to keep pt on NRB.

## 2016-06-27 NOTE — ED Notes (Signed)
Portable chest xray complete, admitting at bedside.

## 2016-06-27 NOTE — ED Notes (Signed)
Patient transported to X-ray 

## 2016-06-27 NOTE — ED Notes (Signed)
ED Provider at bedside. 

## 2016-06-27 NOTE — Progress Notes (Signed)
Pt unable to tolerate mask.  Pt will have home mask brought in tom.  RT left pt on 4LPM [place] and sats in lower 0s.  Rt will monitor.

## 2016-06-27 NOTE — H&P (Deleted)
[address]       [place] [address]             [phone]                                       Mario [name] [place] Medical Record #[id number] Date of Birth: Feb 23, 1951  Referring: Dr. Leonette Monarch M.D. Primary Care: PROVIDER NOT IN SYSTEM  Chief Complaint:       Patient presents with  . Respiratory distress, substernal chest pain  Reason for consultation: Spontaneous left tension pneumothorax  History of Present Illness:     This is a 66 year old Caucasian male with a history of tobacco abuse and COPD who presented to [facility] ED with complaints of substernal chest pain that worsened with movement and shortness of breath. He states the aformentioned symptoms started earlier this morning around 3 am. His oxygenation was 83% on room air upon arrival to the ED. CXR showed left tension pneumothorax with near collapse of the left lung. Dr. Leonette Monarch placed a left chest tube. Chest tube has NO air leak. Follow up chest x ray showed re expansion of the left lung with only a small pneumothorax left lateral chest wall and base. Dr. Servando Snare has been consulted to manage the chest tube. At the time of my exam, patient was in no acute distress. Vital signs were  BP 133/73   Pulse  93   Resp 17   SpO2 93% on aerosol mask.  Current Activity/ Functional Status: Patient is independent with mobility/ambulation, transfers, ADL's, IADL's.   Zubrod Score: At the time of surgery this patient's most appropriate activity status/level should be described as: '[]'$     0    Normal activity, no symptoms '[]'$     1    Restricted in physical strenuous activity but ambulatory, able to do out light work '[x]'$     2    Ambulatory and capable of self care, unable to do work activities, up and about more than 50% of the time                      '[]'$     3    Only limited self care, in bed greater than 50% of waking hours '[]'$     4    Completely disabled, no self care, confined to bed or chair '[]'$     5   Moribund      Past Medical History:  Diagnosis Date  . Anxiety   . Bronchitis   . COPD (chronic obstructive pulmonary disease) ([place])   . Depression   . Hypertension   . Mental disorder   . MI (myocardial infarction) ([place])   . OSA (obstructive sleep apnea)   . Suicide attempt [facility])          Past Surgical History:  Procedure Laterality Date  . cryptorchidism    . SKIN CANCER EXCISION (BCC on nose)      History  Smoking Status  . Current Every Day Smoker  . Packs/day: 0.50 now as trying to quit but 1 pack per day previously  . Years: 40.00  . Types: Cigarettes  Smokeless Tobacco  . Never Used        History  Alcohol Use No    Comment: denies use of any drugs or alcohol     Social History  Social History  . Marital status: Single    Spouse name: N/A  . Number of children: N/A  . Years of education: N/A      Occupational History  . Disabled         Allergies  Allergen Reactions  . Demerol [Meperidine] Nausea And Vomiting    Violently sick  . Zocor [Simvastatin] Other (See Comments)    Made him very jittery, nausea and vomiting             Current Facility-Administered Medications  Medication Dose Route Frequency Provider Last Rate Last Dose  . albuterol (PROVENTIL,VENTOLIN) solution continuous neb  5 mg/hr Nebulization Continuous Fatima Blank, MD 1 mL/hr at 06/27/16 1149 5 mg/hr at 06/27/16 1149  . nitroGLYCERIN (NITROSTAT) SL tablet 0.4 mg  0.4 mg Sublingual Q5 min PRN Fatima Blank, MD             Current Outpatient Prescriptions  Medication Sig Dispense Refill  . acetaminophen (TYLENOL) 500 MG tablet Take 500 mg by mouth every 6 (six) hours as needed for moderate pain.    [name] amitriptyline (ELAVIL) 10 MG tablet Take 20 mg by mouth at bedtime.    . ARIPiprazole (ABILIFY) 5 MG tablet Take 1 tablet (5 mg total) by mouth daily. For depression and anxiety. 30 tablet 0  . aspirin 81 MG tablet  Take 81 mg by mouth daily.    [name] atorvastatin (LIPITOR) 20 MG tablet Take 1 tablet (20 mg total) by mouth daily. (Patient not taking: Reported on 07/21/2014) 30 tablet 0  . buPROPion (WELLBUTRIN SR) 150 MG 12 hr tablet Take 150 mg by mouth 2 (two) times daily.    . DULoxetine (CYMBALTA) 30 MG capsule Take 1 capsule (30 mg total) by mouth daily. 30 capsule 0  . flunisolide (NASALIDE) 25 MCG/ACT (0.025%) SOLN Place 1 spray into both nostrils 2 (two) times daily as needed (STUFFY NOSE).    [name] ibuprofen (ADVIL,MOTRIN) 600 MG tablet Take 600 mg by mouth 2 (two) times daily.    . Ipratropium-Albuterol (COMBIVENT RESPIMAT) 20-100 MCG/ACT AERS respimat Inhale 1 puff into the lungs every 6 (six) hours as needed for wheezing.    [name] lisinopril (PRINIVIL,ZESTRIL) 20 MG tablet Take 1 tablet (20 mg total) by mouth daily. For high blood pressure 30 tablet 0  . meclizine (ANTIVERT) 25 MG tablet Take 1 tablet (25 mg total) by mouth 3 (three) times daily as needed for dizziness. 30 tablet 0  . Naphazoline HCl (CLEAR EYES OP) Place 1-2 drops into both eyes 2 (two) times daily as needed (DRY EYES).    . pantoprazole (PROTONIX) 40 MG tablet Take 1 tablet (40 mg total) by mouth daily. (Patient not taking: Reported on 07/21/2014) 30 tablet 0  . predniSONE (DELTASONE) 20 MG tablet 3 tabs po day one, then 2 po daily x 4 days 11 tablet 0  . sodium chloride (OCEAN) 0.65 % SOLN nasal spray Place 2 sprays into both nostrils 4 (four) times daily as needed for congestion.    [name] tiotropium (SPIRIVA) 18 MCG inhalation capsule Place 1 capsule (18 mcg total) into inhaler and inhale daily. 30 capsule 12  . triamcinolone cream (KENALOG) 0.1 % Apply 1 application topically daily as needed (ITCHING).           Family History  Problem Relation Age of Onset  . Dementia Father is deceased 73's   . Gallstones  Mother is deceased at 54                                                       Review of Systems:                 Cardiac Review of Systems: Y or N             Chest Pain [  Y  ] Resting SOB [ Y ]  Exertional SOB                   [Y ] Orthopnea [ N ]              Palpitations [N  ]Syncope  Aqua.Slicker  ]         Presyncope [ N  ]             General Review of Systems: [Y] = yes [N  ]=no Constitional:  nausea Aqua.Slicker  ]; night sweats Aqua.Slicker  ]; fever [ N ]; or chills [ N ]                                                                           Eye : blurred vision [ N ]; diplopia [  N ];              Resp: cough [ N ];  wheezing[N  ];  hemoptysis[N  ];  GI: vomiting[ N ];  dysphagia[  ]; melena[ N ];  hematochezia Aqua.Slicker  ];  GU: hematuria[ N ];                Skin: rash, swelling[ N ]; or itching[ N ];             Heme/Lymph: bleeding[ N ];  anemia[N  ];  Neuro: Sharlene.Ates  ]; stroke[N  ];  vertigo[ N ];  seizures[N ]  Psych:depression[ Y ]; anxiety[ Y ];             Endocrine: diabetes[ N ];  thyroid dysfunction[N  ];               Physical Exam: BP 133/73   Pulse  93   Resp 17   SpO2 93% on aerosol mask   General appearance: alert, cooperative and no distress Head: Normocephalic, without obvious abnormality, atraumatic Neck: no JVD and supple, symmetrical, trachea midline Resp: Clear to auscultation on right and slightly diminished left base, no wheezing or tachypnea Cardio: RRR, no murmur GI: Soft,obese, non tender, bowel sounds present Extremities: No LE edema Neurologic: Grossly normal  Diagnostic Studies & Laboratory data:     Recent Radiology Findings:   Dg Chest 2 View  Result Date: 06/27/2016 CLINICAL DATA:  Sharp pain. EXAM: CHEST  2 VIEW COMPARISON:  07/21/2014. FINDINGS: Left-sided tension pneumothorax is noted with near complete collapse of the left lung. Mild atelectasis right lung base. Cardiomegaly with normal pulmonary vascularity. No acute bony abnormality. IMPRESSION: Left-sided tension pneumothorax with near complete collapse of the left lung. Critical  Value/emergent results  were called by telephone at the time of interpretation on 06/27/2016 at 11:52 am to nurse Olevia Perches, who verbally acknowledged these results. Electronically Signed   By: Marcello Moores  Register   On: 06/27/2016 11:55    CLINICAL DATA: Shortness of breath and chest pain status post chest tube insertion  EXAM: PORTABLE CHEST 1 VIEW  COMPARISON: 06/27/2016  FINDINGS: A left sided chest tube has been placed with re-expansion of the left lung and decrease in volume of left-sided pneumothorax Small residual pneumothorax is identified laterally and over the left base. Atelectasis is identified within both lung bases.  IMPRESSION: 1. Significant decrease in volume of the left-sided pneumothorax status post chest tube placement.   Electronically Signed By: Kerby Moors M.D. On: 06/27/2016 13:31  I have independently reviewed the above radiologic studies.  Recent Lab Findings:      Lab Results  Component Value Date   WBC 12.8 (H) 06/27/2016   HGB 17.6 (H) 06/27/2016   HCT 51.7 06/27/2016   PLT 246 06/27/2016   GLUCOSE 150 (H) 06/27/2016   CHOL 230 (H) 05/24/2014   TRIG 131 05/24/2014   HDL 30 (L) 05/24/2014   LDLCALC 174 (H) 05/24/2014   ALT 75 (H) 06/27/2016   AST 50 (H) 06/27/2016   NA 137 06/27/2016   K 4.5 06/27/2016   CL 105 06/27/2016   CREATININE 1.13 06/27/2016   BUN 13 06/27/2016   CO2 24 06/27/2016   TSH 1.111 01/11/2013   HGBA1C 6.1 (H) 05/24/2014    Assessment / Plan:      1. Spontaneous left tension pneumothorax-Chest tube placed by ED physician. Follow up chest x ray showed re expansion of the left lung with only a small pneumothorax left lateral chest wall and base. Chest tube has NO air leak. Check CXR in am.  2. COPD-(chronic obstructive pulmonary disease). On Spiriva 18 mcg daily, Combivent Respimat 20/100 mcg/act one puff Q 6 hours PRN 3. Tobacco abuse-current cigarette use about one half pack per day. He is trying to quit  and was on a Nicotine patch. 4. Hypertension-on Lisinopril 20 mg daily. 5. OSA (obstructive sleep apnea)-wears CPAP with oxygen concentrator at night. 6. Depression and anxiety-on Aripiprazole 5 mg daily, Bupropion 150 mg daily, Duloxetine 30 mg daily, and Amitriptyline 20 mg at hs.  I  spent 20 minutes counseling the patient face to face and 50% or more the  time was spent in counseling and coordination of care. The total time spent in the appointment was 60 minutes.  Lars Pinks PA-C 06/27/2016 1:08 PM  Chest tube in good position , no air leak  Will need aggressive rx for underlying copd and smoking cessation  I have seen and examined Mario [name] and agree with the above assessment  and plan.  Grace Isaac MD Bee[place] [phone] Office [phone] 06/27/2016 2:18 PM

## 2016-06-27 NOTE — ED Notes (Signed)
Patient is stable and ready to be transport to the floor at this time.  Report was called to 2W RN.  Belongings taken with the patient to the floor.   

## 2016-06-27 NOTE — H&P (Signed)
[address]       [place] [address]             [phone]        Kenzie Regas [place] Medical Record #[id number] Date of Birth: 1950/06/10  Referring: Dr. Leonette Monarch M.D. Primary Care: PROVIDER NOT IN SYSTEM  Chief Complaint:    Patient presents with  . Respiratory distress, substernal chest pain  Reason for consultation: Spontaneous left tension pneumothorax  History of Present Illness:     This is a 66 year old Caucasian male with a history of tobacco abuse and COPD who presented to [facility] ED with complaints of substernal chest pain that worsened with movement and shortness of breath. He states the aformentioned symptoms started earlier this morning around 3 am. His oxygenation was 83% on room air upon arrival to the ED. CXR showed left tension pneumothorax with near collapse of the left lung. Dr. Leonette Monarch placed a left chest tube. Chest tube has NO air leak. Follow up chest x ray showed re expansion of the left lung with only a small pneumothorax left lateral chest wall and base. Dr. Servando Snare has been consulted to manage the chest tube. At the time of my exam, patient was in no acute distress. Vital signs were  BP 133/73   Pulse  93   Resp 17   SpO2 93% on aerosol mask.  Current Activity/ Functional Status: Patient is independent with mobility/ambulation, transfers, ADL's, IADL's.   Zubrod Score: At the time of surgery this patient's most appropriate activity status/level should be described as: '[]'$     0    Normal activity, no symptoms '[]'$     1    Restricted in physical strenuous activity but ambulatory, able to do out light work '[x]'$     2    Ambulatory and capable of self care, unable to do work activities, up and about more than 50% of the time                      '[]'$     3    Only limited self care, in bed greater than 50% of waking hours '[]'$     4    Completely disabled, no self care, confined to bed or chair '[]'$     5  Moribund  Past Medical History:    Diagnosis Date  . Anxiety   . Bronchitis   . COPD (chronic obstructive pulmonary disease) ([place])   . Depression   . Hypertension   . Mental disorder   . MI (myocardial infarction) ([place])   . OSA (obstructive sleep apnea)   . Suicide attempt [facility])     Past Surgical History:  Procedure Laterality Date  . cryptorchidism    . SKIN CANCER EXCISION (BCC on nose)      History  Smoking Status  . Current Every Day Smoker  . Packs/day: 0.50 now as trying to quit but 1 pack per day previously  . Years: 40.00  . Types: Cigarettes  Smokeless Tobacco  . Never Used   History  Alcohol Use No    Comment: denies use of any drugs or alcohol    Social History   Social History  . Marital status: Single    Spouse name: N/A  . Number of children: N/A  . Years of education: N/A   Occupational History  . Disabled    Allergies  Allergen Reactions  . Demerol [Meperidine] Nausea And Vomiting  Violently sick  . Zocor [Simvastatin] Other (See Comments)    Made him very jittery, nausea and vomiting    Current Facility-Administered Medications  Medication Dose Route Frequency Provider Last Rate Last Dose  . albuterol (PROVENTIL,VENTOLIN) solution continuous neb  5 mg/hr Nebulization Continuous Fatima Blank, MD 1 mL/hr at 06/27/16 1149 5 mg/hr at 06/27/16 1149  . nitroGLYCERIN (NITROSTAT) SL tablet 0.4 mg  0.4 mg Sublingual Q5 min PRN Fatima Blank, MD       Current Outpatient Prescriptions  Medication Sig Dispense Refill  . acetaminophen (TYLENOL) 500 MG tablet Take 500 mg by mouth every 6 (six) hours as needed for moderate pain.    [name] amitriptyline (ELAVIL) 10 MG tablet Take 20 mg by mouth at bedtime.    . ARIPiprazole (ABILIFY) 5 MG tablet Take 1 tablet (5 mg total) by mouth daily. For depression and anxiety. 30 tablet 0  . aspirin 81 MG tablet Take 81 mg by mouth daily.    [name] atorvastatin (LIPITOR) 20 MG tablet Take 1 tablet (20 mg total) by mouth daily. (Patient not  taking: Reported on 07/21/2014) 30 tablet 0  . buPROPion (WELLBUTRIN SR) 150 MG 12 hr tablet Take 150 mg by mouth 2 (two) times daily.    . DULoxetine (CYMBALTA) 30 MG capsule Take 1 capsule (30 mg total) by mouth daily. 30 capsule 0  . flunisolide (NASALIDE) 25 MCG/ACT (0.025%) SOLN Place 1 spray into both nostrils 2 (two) times daily as needed (STUFFY NOSE).    [name] ibuprofen (ADVIL,MOTRIN) 600 MG tablet Take 600 mg by mouth 2 (two) times daily.    . Ipratropium-Albuterol (COMBIVENT RESPIMAT) 20-100 MCG/ACT AERS respimat Inhale 1 puff into the lungs every 6 (six) hours as needed for wheezing.    [name] lisinopril (PRINIVIL,ZESTRIL) 20 MG tablet Take 1 tablet (20 mg total) by mouth daily. For high blood pressure 30 tablet 0  . meclizine (ANTIVERT) 25 MG tablet Take 1 tablet (25 mg total) by mouth 3 (three) times daily as needed for dizziness. 30 tablet 0  . Naphazoline HCl (CLEAR EYES OP) Place 1-2 drops into both eyes 2 (two) times daily as needed (DRY EYES).    . pantoprazole (PROTONIX) 40 MG tablet Take 1 tablet (40 mg total) by mouth daily. (Patient not taking: Reported on 07/21/2014) 30 tablet 0  . predniSONE (DELTASONE) 20 MG tablet 3 tabs po day one, then 2 po daily x 4 days 11 tablet 0  . sodium chloride (OCEAN) 0.65 % SOLN nasal spray Place 2 sprays into both nostrils 4 (four) times daily as needed for congestion.    [name] tiotropium (SPIRIVA) 18 MCG inhalation capsule Place 1 capsule (18 mcg total) into inhaler and inhale daily. 30 capsule 12  . triamcinolone cream (KENALOG) 0.1 % Apply 1 application topically daily as needed (ITCHING).      Family History  Problem Relation Age of Onset  . Dementia Father is deceased 84's   . Gallstones  Mother is deceased at 42                           Review of Systems:     Cardiac Review of Systems: Y or N  Chest Pain [  Y  ] Resting SOB [ Y ] Exertional SOB                   Jazmín.Cullens ] Orthopnea [ N ]    Palpitations Aqua.Slicker  ]  Syncope  Aqua.Slicker  ]   Presyncope [ N   ]  General Review of Systems: [Y] = yes [N  ]=no Constitional:  nausea Aqua.Slicker  ]; night sweats Aqua.Slicker  ]; fever [ N ]; or chills [ N ]                                                                Eye : blurred vision [ N ]; diplopia [  N ];              Resp: cough [ N ];  wheezing[N  ];  hemoptysis[N  ];  GI: vomiting[ N ];  dysphagia[  ]; melena[ N ];  hematochezia Aqua.Slicker  ];  GU: hematuria[ N ];                Skin: rash, swelling[ N ]; or itching[ N ];  Heme/Lymph: bleeding[ N ];  anemia[N  ];  Neuro: Sharlene.Ates  ]; stroke[N  ];  vertigo[ N ];  seizures[N ]  Psych:depression[ Y ]; anxiety[ Y ];  Endocrine: diabetes[ N ];  thyroid dysfunction[N  ];    Physical Exam: BP 133/73   Pulse  93   Resp 17   SpO2 93% on aerosol mask   General appearance: alert, cooperative and no distress Head: Normocephalic, without obvious abnormality, atraumatic Neck: no JVD and supple, symmetrical, trachea midline Resp: Clear to auscultation on right and slightly diminished left base, no wheezing or tachypnea Cardio: RRR, no murmur GI: Soft,obese, non tender, bowel sounds present Extremities: No LE edema Neurologic: Grossly normal  Diagnostic Studies & Laboratory data:     Recent Radiology Findings:   Dg Chest 2 View  Result Date: 06/27/2016 CLINICAL DATA:  Sharp pain. EXAM: CHEST  2 VIEW COMPARISON:  07/21/2014. FINDINGS: Left-sided tension pneumothorax is noted with near complete collapse of the left lung. Mild atelectasis right lung base. Cardiomegaly with normal pulmonary vascularity. No acute bony abnormality. IMPRESSION: Left-sided tension pneumothorax with near complete collapse of the left lung. Critical Value/emergent results were called by telephone at the time of interpretation on 06/27/2016 at 11:52 am to nurse Olevia Perches, who verbally acknowledged these results. Electronically Signed   By: Marcello Moores  Register   On: 06/27/2016 11:55    CLINICAL DATA:  Shortness of breath and chest pain status post chest tube  insertion  EXAM: PORTABLE CHEST 1 VIEW  COMPARISON:  06/27/2016  FINDINGS: A left sided chest tube has been placed with re-expansion of the left lung and decrease in volume of left-sided pneumothorax. Small residual pneumothorax is identified laterally and over the left base. Atelectasis is identified within both lung bases.  IMPRESSION: 1. Significant decrease in volume of the left-sided pneumothorax status post chest tube placement.   Electronically Signed   By: Kerby Moors M.D.   On: 06/27/2016 13:31  I have independently reviewed the above radiologic studies.  Recent Lab Findings: Lab Results  Component Value Date   WBC 12.8 (H) 06/27/2016   HGB 17.6 (H) 06/27/2016   HCT 51.7 06/27/2016   PLT 246 06/27/2016   GLUCOSE 150 (H) 06/27/2016   CHOL 230 (H) 05/24/2014   TRIG 131 05/24/2014   HDL 30 (L) 05/24/2014   LDLCALC 174 (H) 05/24/2014   ALT 75 (  H) 06/27/2016   AST 50 (H) 06/27/2016   NA 137 06/27/2016   K 4.5 06/27/2016   CL 105 06/27/2016   CREATININE 1.13 06/27/2016   BUN 13 06/27/2016   CO2 24 06/27/2016   TSH 1.111 01/11/2013   HGBA1C 6.1 (H) 05/24/2014    Assessment / Plan:      1. Spontaneous left tension pneumothorax-Chest tube placed by ED physician. Follow up chest x ray showed re expansion of the left lung with only a small pneumothorax left lateral chest wall and base. Chest tube has NO air leak. Check CXR in am.  2. COPD-(chronic obstructive pulmonary disease). On Spiriva 18 mcg daily, Combivent Respimat 20/100 mcg/act one puff Q 6 hours PRN 3. Tobacco abuse-current cigarette use about one half pack per day. He is trying to quit and was on a Nicotine patch. 4. Hypertension-on Lisinopril 20 mg daily. 5. OSA (obstructive sleep apnea)-wears CPAP with oxygen concentrator at night. 6. Depression and anxiety-on Aripiprazole 5 mg daily, Bupropion 150 mg daily, Duloxetine 30 mg daily, and Amitriptyline 20 mg at hs.  I  spent 20 minutes  counseling the patient face to face and 50% or more the  time was spent in counseling and coordination of care. The total time spent in the appointment was 60 minutes.  Lars Pinks PA-C 06/27/2016 1:08 PM  Chest tube in good position , no air leak  Will need aggressive rx for underlying copd and smoking cessation  I have seen and examined Corliss Marcus and agree with the above assessment  and plan.  Grace Isaac MD Beeper [phone] Office [phone] 06/27/2016 2:51 PM

## 2016-06-27 NOTE — Consult Note (Signed)
[address]       [place] [address]             [phone]        Madix Ned [place] Medical Record #[id number] Date of Birth: [birth year]  Referring: Dr. Leonette Monarch M.D. Primary Care: PROVIDER NOT IN SYSTEM  Chief Complaint:    Patient presents with  . Respiratory distress, substernal chest pain  Reason for consultation: Spontaneous left tension pneumothorax  History of Present Illness:     This is a 66 year old Caucasian male with a history of tobacco abuse and COPD who presented to [facility] ED with complaints of substernal chest pain that worsened with movement and shortness of breath. He states the aformentioned symptoms started earlier this morning around 3 am. His oxygenation was 83% on room air upon arrival to the ED. CXR showed left tension pneumothorax with near collapse of the left lung. Dr. Leonette Monarch placed a left chest tube. Chest tube has NO air leak. Follow up chest x ray showed re expansion of the left lung with only a small pneumothorax left lateral chest wall and base. Dr. Servando Snare has been consulted to manage the chest tube. At the time of my exam, patient was in no acute distress. Vital signs were  BP 133/73   Pulse  93   Resp 17   SpO2 93% on aerosol mask.  Current Activity/ Functional Status: Patient is independent with mobility/ambulation, transfers, ADL's, IADL's.   Zubrod Score: At the time of surgery this patient's most appropriate activity status/level should be described as: '[]'$     0    Normal activity, no symptoms '[]'$     1    Restricted in physical strenuous activity but ambulatory, able to do out light work '[x]'$     2    Ambulatory and capable of self care, unable to do work activities, up and about more than 50% of the time                      '[]'$     3    Only limited self care, in bed greater than 50% of waking hours '[]'$     4    Completely disabled, no self care, confined to bed or chair '[]'$     5  Moribund  Past Medical History:    Diagnosis Date  . Anxiety   . Bronchitis   . COPD (chronic obstructive pulmonary disease) ([place])   . Depression   . Hypertension   . Mental disorder   . MI (myocardial infarction) ([place])   . OSA (obstructive sleep apnea)   . Suicide attempt [facility])     Past Surgical History:  Procedure Laterality Date  . cryptorchidism    . SKIN CANCER EXCISION (BCC on nose)      History  Smoking Status  . Current Every Day Smoker  . Packs/day: 0.50 now as trying to quit but 1 pack per day previously  . Years: 40.00  . Types: Cigarettes  Smokeless Tobacco  . Never Used   History  Alcohol Use No    Comment: denies use of any drugs or alcohol    Social History   Social History  . Marital status: Single    Spouse name: N/A  . Number of children: N/A  . Years of education: N/A   Occupational History  . Disabled    Allergies  Allergen Reactions  . Demerol [Meperidine] Nausea And Vomiting  Violently sick  . Zocor [Simvastatin] Other (See Comments)    Made him very jittery, nausea and vomiting    Current Facility-Administered Medications  Medication Dose Route Frequency Provider Last Rate Last Dose  . albuterol (PROVENTIL,VENTOLIN) solution continuous neb  5 mg/hr Nebulization Continuous Fatima Blank, MD 1 mL/hr at 06/27/16 1149 5 mg/hr at 06/27/16 1149  . nitroGLYCERIN (NITROSTAT) SL tablet 0.4 mg  0.4 mg Sublingual Q5 min PRN Fatima Blank, MD       Current Outpatient Prescriptions  Medication Sig Dispense Refill  . acetaminophen (TYLENOL) 500 MG tablet Take 500 mg by mouth every 6 (six) hours as needed for moderate pain.    [name] amitriptyline (ELAVIL) 10 MG tablet Take 20 mg by mouth at bedtime.    . ARIPiprazole (ABILIFY) 5 MG tablet Take 1 tablet (5 mg total) by mouth daily. For depression and anxiety. 30 tablet 0  . aspirin 81 MG tablet Take 81 mg by mouth daily.    [name] atorvastatin (LIPITOR) 20 MG tablet Take 1 tablet (20 mg total) by mouth daily. (Patient not  taking: Reported on 07/21/2014) 30 tablet 0  . buPROPion (WELLBUTRIN SR) 150 MG 12 hr tablet Take 150 mg by mouth 2 (two) times daily.    . DULoxetine (CYMBALTA) 30 MG capsule Take 1 capsule (30 mg total) by mouth daily. 30 capsule 0  . flunisolide (NASALIDE) 25 MCG/ACT (0.025%) SOLN Place 1 spray into both nostrils 2 (two) times daily as needed (STUFFY NOSE).    [name] ibuprofen (ADVIL,MOTRIN) 600 MG tablet Take 600 mg by mouth 2 (two) times daily.    . Ipratropium-Albuterol (COMBIVENT RESPIMAT) 20-100 MCG/ACT AERS respimat Inhale 1 puff into the lungs every 6 (six) hours as needed for wheezing.    [name] lisinopril (PRINIVIL,ZESTRIL) 20 MG tablet Take 1 tablet (20 mg total) by mouth daily. For high blood pressure 30 tablet 0  . meclizine (ANTIVERT) 25 MG tablet Take 1 tablet (25 mg total) by mouth 3 (three) times daily as needed for dizziness. 30 tablet 0  . Naphazoline HCl (CLEAR EYES OP) Place 1-2 drops into both eyes 2 (two) times daily as needed (DRY EYES).    . pantoprazole (PROTONIX) 40 MG tablet Take 1 tablet (40 mg total) by mouth daily. (Patient not taking: Reported on 07/21/2014) 30 tablet 0  . predniSONE (DELTASONE) 20 MG tablet 3 tabs po day one, then 2 po daily x 4 days 11 tablet 0  . sodium chloride (OCEAN) 0.65 % SOLN nasal spray Place 2 sprays into both nostrils 4 (four) times daily as needed for congestion.    [name] tiotropium (SPIRIVA) 18 MCG inhalation capsule Place 1 capsule (18 mcg total) into inhaler and inhale daily. 30 capsule 12  . triamcinolone cream (KENALOG) 0.1 % Apply 1 application topically daily as needed (ITCHING).      Family History  Problem Relation Age of Onset  . Dementia Father is deceased 51's   . Gallstones  Mother is deceased at 87                           Review of Systems:     Cardiac Review of Systems: Y or N  Chest Pain [  Y  ] Resting SOB [ Y ] Exertional SOB                   Jazmín.Cullens ] Orthopnea [ N ]    Palpitations Aqua.Slicker  ]  Syncope  Aqua.Slicker  ]   Presyncope [ N   ]  General Review of Systems: [Y] = yes [N  ]=no Constitional:  nausea Aqua.Slicker  ]; night sweats Aqua.Slicker  ]; fever [ N ]; or chills [ N ]                                                                Eye : blurred vision [ N ]; diplopia [  N ];              Resp: cough [ N ];  wheezing[N  ];  hemoptysis[N  ];  GI: vomiting[ N ];  dysphagia[  ]; melena[ N ];  hematochezia Aqua.Slicker  ];  GU: hematuria[ N ];                Skin: rash, swelling[ N ]; or itching[ N ];  Heme/Lymph: bleeding[ N ];  anemia[N  ];  Neuro: Sharlene.Ates  ]; stroke[N  ];  vertigo[ N ];  seizures[N ]  Psych:depression[ Y ]; anxiety[ Y ];  Endocrine: diabetes[ N ];  thyroid dysfunction[N  ];    Physical Exam: BP 133/73   Pulse  93   Resp 17   SpO2 93% on aerosol mask   General appearance: alert, cooperative and no distress Head: Normocephalic, without obvious abnormality, atraumatic Neck: no JVD and supple, symmetrical, trachea midline Resp: Clear to auscultation on right and slightly diminished left base, no wheezing or tachypnea Cardio: RRR, no murmur GI: Soft,obese, non tender, bowel sounds present Extremities: No LE edema Neurologic: Grossly normal  Diagnostic Studies & Laboratory data:     Recent Radiology Findings:   Dg Chest 2 View  Result Date: 06/27/2016 CLINICAL DATA:  Sharp pain. EXAM: CHEST  2 VIEW COMPARISON:  07/21/2014. FINDINGS: Left-sided tension pneumothorax is noted with near complete collapse of the left lung. Mild atelectasis right lung base. Cardiomegaly with normal pulmonary vascularity. No acute bony abnormality. IMPRESSION: Left-sided tension pneumothorax with near complete collapse of the left lung. Critical Value/emergent results were called by telephone at the time of interpretation on 06/27/2016 at 11:52 am to nurse Olevia Perches, who verbally acknowledged these results. Electronically Signed   By: Marcello Moores  Register   On: 06/27/2016 11:55    CLINICAL DATA:  Shortness of breath and chest pain status post chest tube  insertion  EXAM: PORTABLE CHEST 1 VIEW  COMPARISON:  06/27/2016  FINDINGS: A left sided chest tube has been placed with re-expansion of the left lung and decrease in volume of left-sided pneumothorax. Small residual pneumothorax is identified laterally and over the left base. Atelectasis is identified within both lung bases.  IMPRESSION: 1. Significant decrease in volume of the left-sided pneumothorax status post chest tube placement.   Electronically Signed   By: Kerby Moors M.D.   On: 06/27/2016 13:31  I have independently reviewed the above radiologic studies.  Recent Lab Findings: Lab Results  Component Value Date   WBC 12.8 (H) 06/27/2016   HGB 17.6 (H) 06/27/2016   HCT 51.7 06/27/2016   PLT 246 06/27/2016   GLUCOSE 150 (H) 06/27/2016   CHOL 230 (H) 05/24/2014   TRIG 131 05/24/2014   HDL 30 (L) 05/24/2014   LDLCALC 174 (H) 05/24/2014   ALT 75 (  H) 06/27/2016   AST 50 (H) 06/27/2016   NA 137 06/27/2016   K 4.5 06/27/2016   CL 105 06/27/2016   CREATININE 1.13 06/27/2016   BUN 13 06/27/2016   CO2 24 06/27/2016   TSH 1.111 01/11/2013   HGBA1C 6.1 (H) 05/24/2014    Assessment / Plan:      1. Spontaneous left tension pneumothorax-Chest tube placed by ED physician. Follow up chest x ray showed re expansion of the left lung with only a small pneumothorax left lateral chest wall and base. Chest tube has NO air leak. Check CXR in am.  2. COPD-(chronic obstructive pulmonary disease). On Spiriva 18 mcg daily, Combivent Respimat 20/100 mcg/act one puff Q 6 hours PRN 3. Tobacco abuse-current cigarette use about one half pack per day. He is trying to quit and was on a Nicotine patch. 4. Hypertension-on Lisinopril 20 mg daily. 5. OSA (obstructive sleep apnea)-wears CPAP with oxygen concentrator at night. 6. Depression and anxiety-on Aripiprazole 5 mg daily, Bupropion 150 mg daily, Duloxetine 30 mg daily, and Amitriptyline 20 mg at hs.  I  spent 20 minutes  counseling the patient face to face and 50% or more the  time was spent in counseling and coordination of care. The total time spent in the appointment was 60 minutes.  Lars Pinks PA-C 06/27/2016 1:08 PM  Chest tube in good position , no air leak  Will need aggressive rx for underlying copd and smoking cessation  I have seen and examined Mario [name] and agree with the above assessment  and plan.  Grace Isaac MD Beeper [phone] Office [phone] 06/27/2016 2:18 PM

## 2016-06-27 NOTE — ED Provider Notes (Signed)
[place] DEPT Provider Note   CSN: [id number] Arrival date & time: 06/27/16  1051     History   Chief Complaint Chief Complaint  Patient presents with  . Respiratory Distress   Triage note: Pt arrives from home by [facility] that started suddenly this am. Pt reports substernal chest pain that is worse with movement. Pt reports hx of COPD. EMS gave '10mg'$  albuterol, '1mg'$  Atrovent, 125 soul medrol. 2 G magnesium IV en route. EMS reports improve lung sounds from diminished to audible wheezing. Pt has o2 sats on 83% on room air with ems arrival. Pt arrives to ED with o2 sat's 91% on neb treatment  HPI Kyley Laurel is a 66 y.o. male.  The history is provided by the patient.  Chest Pain   This is a recurrent problem. The current episode started 6 to 12 hours ago. The problem occurs constantly. The problem has not changed since onset.The pain is associated with movement. The pain is present in the substernal region. The quality of the pain is described as pressure-like and sharp. The pain radiates to the mid back. The symptoms are aggravated by certain positions. Associated symptoms include shortness of breath. Pertinent negatives include no cough, no fever, no lower extremity edema, no nausea and no vomiting. Risk factors include smoking/tobacco exposure, obesity, male gender and being elderly.  His past medical history is significant for COPD and hypertension.  Pertinent negatives for past medical history include no MI (pt denies).    Past Medical History:  Diagnosis Date  . Anxiety   . Bronchitis   . COPD (chronic obstructive pulmonary disease) ([place])   . Depression   . Hypertension   . Mental disorder   . MI (myocardial infarction) ([place])    ????  . OSA (obstructive sleep apnea)   . Suicide attempt [facility])     Patient Active Problem List   Diagnosis Date Noted  . Dyspnea 05/23/2014  . COPD exacerbation ([place]) 05/23/2014  . Malingering 01/21/2013  . MDD (major depressive  disorder), recurrent severe, without psychosis ([place]) 01/13/2013  . Depression 01/11/2013  . Tobacco abuse 01/11/2013  . Obesity, unspecified 01/11/2013  . Old MI (myocardial infarction) 01/11/2013  . Chest pain 01/10/2013  . HTN (hypertension) 01/10/2013    Past Surgical History:  Procedure Laterality Date  . cryptorchidism    . SKIN CANCER EXCISION         Home Medications    Prior to Admission medications   Medication Sig Start Date End Date Taking? Authorizing Provider  acetaminophen (TYLENOL) 500 MG tablet Take 500 mg by mouth every 6 (six) hours as needed for moderate pain.    Historical Provider, MD  amitriptyline (ELAVIL) 10 MG tablet Take 20 mg by mouth at bedtime.    Historical Provider, MD  ARIPiprazole (ABILIFY) 5 MG tablet Take 1 tablet (5 mg total) by mouth daily. For depression and anxiety. 07/21/14   Evelina Bucy, MD  aspirin 81 MG tablet Take 81 mg by mouth daily.    Historical Provider, MD  atorvastatin (LIPITOR) 20 MG tablet Take 1 tablet (20 mg total) by mouth daily. Patient not taking: Reported on 07/21/2014 05/26/14   Velta Addison Mikhail, DO  buPROPion [facility] SR) 150 MG 12 hr tablet Take 150 mg by mouth 2 (two) times daily.    Historical Provider, MD  DULoxetine (CYMBALTA) 30 MG capsule Take 1 capsule (30 mg total) by mouth daily. 07/21/14   Evelina Bucy, MD  flunisolide (NASALIDE) 25 MCG/ACT (0.025%) SOLN Place  1 spray into both nostrils 2 (two) times daily as needed (STUFFY NOSE).    Historical Provider, MD  ibuprofen (ADVIL,MOTRIN) 600 MG tablet Take 600 mg by mouth 2 (two) times daily.    Historical Provider, MD  Ipratropium-Albuterol (COMBIVENT RESPIMAT) 20-100 MCG/ACT AERS respimat Inhale 1 puff into the lungs every 6 (six) hours as needed for wheezing.    Historical Provider, MD  lisinopril (PRINIVIL,ZESTRIL) 20 MG tablet Take 1 tablet (20 mg total) by mouth daily. For high blood pressure 01/21/13   Ruben Im, PA-C  meclizine (ANTIVERT) 25 MG tablet  Take 1 tablet (25 mg total) by mouth 3 (three) times daily as needed for dizziness. 01/08/14   Noland Fordyce, PA-C  Naphazoline HCl (CLEAR EYES OP) Place 1-2 drops into both eyes 2 (two) times daily as needed (DRY EYES).    Historical Provider, MD  pantoprazole (PROTONIX) 40 MG tablet Take 1 tablet (40 mg total) by mouth daily. Patient not taking: Reported on 07/21/2014 05/26/14   Velta Addison Mikhail, DO  predniSONE (DELTASONE) 20 MG tablet 3 tabs po day one, then 2 po daily x 4 days 07/21/14   Evelina Bucy, MD  sodium chloride (OCEAN) 0.65 % SOLN nasal spray Place 2 sprays into both nostrils 4 (four) times daily as needed for congestion.    Historical Provider, MD  tiotropium (SPIRIVA) 18 MCG inhalation capsule Place 1 capsule (18 mcg total) into inhaler and inhale daily. 05/26/14   Maryann Mikhail, DO  triamcinolone cream (KENALOG) 0.1 % Apply 1 application topically daily as needed (ITCHING).    Historical Provider, MD    Family History Family History  Problem Relation Age of Onset  . Dementia Father     Social History Social History  Substance Use Topics  . Smoking status: Current Every Day Smoker    Packs/day: 0.50    Years: 20.00    Types: Cigarettes  . Smokeless tobacco: Never Used  . Alcohol use No     Comment: denies use of any drugs or alcohol     Allergies   Demerol [meperidine] and Zocor [simvastatin]   Review of Systems Review of Systems  Constitutional: Negative for fever.  Respiratory: Positive for shortness of breath. Negative for cough.   Cardiovascular: Positive for chest pain.  Gastrointestinal: Negative for nausea and vomiting.  All other systems are reviewed and are negative for acute change except as noted in the HPI   Physical Exam Updated Vital Signs BP (!) 130/92 (BP Location: Right Arm)   Pulse (!) 107   Resp (!) 24   SpO2 91%   Physical Exam  Constitutional: He is oriented to person, place, and time. He appears well-developed and well-nourished. No  distress.  HENT:  Head: Normocephalic and atraumatic.  Nose: Nose normal.  Eyes: Conjunctivae and EOM are normal. Pupils are equal, round, and reactive to light. Right eye exhibits no discharge. Left eye exhibits no discharge. No scleral icterus.  Neck: Normal range of motion. Neck supple.  Cardiovascular: Normal rate and regular rhythm.  Exam reveals no gallop and no friction rub.   No murmur heard. Pulmonary/Chest: Effort normal. No stridor. Tachypnea noted. No respiratory distress. He has no decreased breath sounds. He has wheezes (exp wheezing throughout).  Abdominal: Soft. He exhibits no distension. There is no tenderness.  Musculoskeletal: He exhibits no edema or tenderness.  Neurological: He is alert and oriented to person, place, and time.  Skin: Skin is warm and dry. No rash noted. He is not diaphoretic.  No erythema.  Psychiatric: He has a normal mood and affect.  Vitals reviewed.    ED Treatments / Results  Labs (all labs ordered are listed, but only abnormal results are displayed) Labs Reviewed  COMPREHENSIVE METABOLIC PANEL - Abnormal; Notable for the following:       Result Value   Glucose, Bld 150 (*)    Calcium 8.5 (*)    AST 50 (*)    ALT 75 (*)    All other components within normal limits  CBC WITH DIFFERENTIAL/PLATELET - Abnormal; Notable for the following:    WBC 12.8 (*)    Hemoglobin 17.6 (*)    Neutro Abs 9.4 (*)    All other components within normal limits  D-DIMER, QUANTITATIVE (NOT AT [facility])  I-STAT TROPOININ, ED    EKG  EKG Interpretation  Date/Time:  Monday June 27 2016 10:52:26 EDT Ventricular Rate:  107 PR Interval:    QRS Duration: 111 QT Interval:  339 QTC Calculation: 453 R Axis:   63 Text Interpretation:  Sinus tachycardia Low voltage, extremity and precordial leads Otherwise no significant change Confirmed by [facility] MD, PEDRO ([id number]) on 06/27/2016 11:13:16 AM       Radiology Dg Chest 2 View  Result Date: 06/27/2016 CLINICAL  DATA:  Sharp pain. EXAM: CHEST  2 VIEW COMPARISON:  07/21/2014. FINDINGS: Left-sided tension pneumothorax is noted with near complete collapse of the left lung. Mild atelectasis right lung base. Cardiomegaly with normal pulmonary vascularity. No acute bony abnormality. IMPRESSION: Left-sided tension pneumothorax with near complete collapse of the left lung. Critical Value/emergent results were called by telephone at the time of interpretation on 06/27/2016 at 11:52 am to nurse Olevia Perches, who verbally acknowledged these results. Electronically Signed   By: Marcello Moores  Register   On: 06/27/2016 11:55   Dg Chest Portable 1 View  Result Date: 06/27/2016 CLINICAL DATA:  Shortness of breath and chest pain status post chest tube insertion EXAM: PORTABLE CHEST 1 VIEW COMPARISON:  06/27/2016 FINDINGS: A left sided chest tube has been placed with re-expansion of the left lung and decrease in volume of left-sided pneumothorax. Small residual pneumothorax is identified laterally and over the left base. Atelectasis is identified within both lung bases. IMPRESSION: 1. Significant decrease in volume of the left-sided pneumothorax status post chest tube placement. Electronically Signed   By: Kerby Moors M.D.   On: 06/27/2016 13:31    Procedures CHEST TUBE INSERTION Date/Time: 06/27/2016 2:50 PM Performed by: Fatima Blank Authorized by: Fatima Blank   Consent:    Consent obtained:  Written   Consent given by:  Patient   Risks discussed:  Damage to surrounding structures and pain   Alternatives discussed:  No treatment Pre-procedure details:    Skin preparation:  ChloraPrep   Preparation: Patient was prepped and draped in the usual sterile fashion   Anesthesia (see MAR for exact dosages):    Anesthesia method:  Local infiltration   Local anesthetic:  Lidocaine 2% WITH epi Procedure details:    Placement location:  L lateral   Scalpel size:  11   Tube size (Fr):  Minicatheter   Ultrasound  guidance: no     Tension pneumothorax: yes     Tube connected to:  Suction   Suture material: 3-0 silk.   Dressing:  4x4 sterile gauze and petrolatum-impregnated gauze Post-procedure details:    Post-insertion x-ray findings: tube in good position     Patient tolerance of procedure:  Tolerated well, no immediate complications   (  including critical care time)  Medications Ordered in ED Medications  nitroGLYCERIN (NITROSTAT) SL tablet 0.4 mg (not administered)  albuterol (PROVENTIL,VENTOLIN) solution continuous neb (5 mg/hr Nebulization New Bag/Given 06/27/16 1149)  nitroGLYCERIN (NITROGLYN) 2 % ointment 1 inch (1 inch Topical Given 06/27/16 1120)  magnesium sulfate IVPB 2 g 50 mL (0 g Intravenous Stopped 06/27/16 1258)  ipratropium (ATROVENT) nebulizer solution 0.5 mg (0.5 mg Nebulization Given 06/27/16 1149)  fentaNYL (SUBLIMAZE) injection 100 mcg (100 mcg Intravenous Given 06/27/16 1237)  ondansetron (ZOFRAN) injection 4 mg (4 mg Intravenous Given 06/27/16 1237)  lidocaine-EPINEPHrine (XYLOCAINE W/EPI) 2 %-1:200000 (PF) injection 10 mL (10 mLs Intradermal Given by Other 06/27/16 1238)     Initial Impression / Assessment and Plan / ED Course  I have reviewed the triage vital signs and the nursing notes.  Pertinent labs & imaging results that were available during my care of the patient were reviewed by me and considered in my medical decision making (see chart for details).  Clinical Course as of Jun 28 1450  Mon Jun 27, 2016  1330 Tension PTx noted on CXR. HDS. Pigtail inserted with reexpansion of lung on repeat CXR. CT sx consulted and will admit pt for further management.  [PC]  3361 DG Chest Portable 1 View [NP]    Clinical Course User Index [NP] Jenny Reichmann, MD [PC] Fatima Blank, MD      Final Clinical Impressions(s) / ED Diagnoses   Final diagnoses:  Chest pain  Tension pneumothorax  Hypoxia      Fatima Blank, MD 06/27/16 1453

## 2016-06-27 NOTE — Consult Note (Signed)
Name: Mario [name] MRN: [id number] DOB: [birth year]    ADMISSION DATE:  06/27/2016 CONSULTATION DATE:  4/30  REFERRING MD :  Servando Snare   CHIEF COMPLAINT/reason for consult COPD  BRIEF PATIENT DESCRIPTION:  66 year old male w/ h/o COPD and tobacco abuse. He developed acute onset of substernal chest pain w/ associated shortness of breath. EMS was called and on arrival sats were 83% on room air. CXR showed left sided tension PTX. A left chest tube was placed per the EDP.  No air leak identified post placement.  Follow up CXR showed resolution of pneumothorax.    PAST MEDICAL HISTORY :   has a past medical history of Anxiety; Bronchitis; COPD (chronic obstructive pulmonary disease) ([place]); Depression; Hypertension; Mental disorder; MI (myocardial infarction) ([place]); OSA (obstructive sleep apnea); and Suicide attempt ([place]).  has a past surgical history that includes Skin cancer excision and cryptorchidism.   Prior to Admission medications   Medication Sig Start Date End Date Taking? Authorizing Provider  albuterol (PROVENTIL HFA;VENTOLIN HFA) 108 (90 Base) MCG/ACT inhaler Inhale 2 puffs into the lungs 4 (four) times daily.   Yes Historical Provider, MD  amitriptyline (ELAVIL) 10 MG tablet Take 20 mg by mouth at bedtime.   Yes Historical Provider, MD  aspirin 81 MG tablet Take 81 mg by mouth daily.   Yes Historical Provider, MD  buPROPion (WELLBUTRIN SR) 150 MG 12 hr tablet Take 300 mg by mouth every morning.    Yes Historical Provider, MD  carboxymethylcellulose 1 % ophthalmic solution Place 1 drop into both eyes at bedtime.   Yes Historical Provider, MD  Cholecalciferol (VITAMIN D) 2000 units CAPS Take 2,000 Units by mouth daily.   Yes Historical Provider, MD  clotrimazole (LOTRIMIN) 1 % cream Apply 1 application topically 2 (two) times daily.   Yes Historical Provider, MD  diclofenac (VOLTAREN) 75 MG EC tablet Take 75 mg by mouth 2 (two) times daily.   Yes Historical Provider, MD  DULoxetine  (CYMBALTA) 30 MG capsule Take 1 capsule (30 mg total) by mouth daily. 07/21/14  Yes Evelina Bucy, MD  flunisolide (NASALIDE) 25 MCG/ACT (0.025%) SOLN Place 1 spray into both nostrils daily.    Yes Historical Provider, MD  folic acid (FOLVITE) 1 MG tablet Take 1 mg by mouth daily.   Yes Historical Provider, MD  Hypromellose (ARTIFICIAL TEARS OP) Place 1 drop into both eyes daily as needed (dry eyes).   Yes Historical Provider, MD  Ipratropium-Albuterol (COMBIVENT RESPIMAT) 20-100 MCG/ACT AERS respimat Inhale 1 puff into the lungs every 6 (six) hours as needed for wheezing.   Yes Historical Provider, MD  lisinopril (PRINIVIL,ZESTRIL) 20 MG tablet Take 1 tablet (20 mg total) by mouth daily. For high blood pressure 01/21/13  Yes Milta Deiters T Mashburn, PA-C  methotrexate (RHEUMATREX) 2.5 MG tablet Take 15 mg by mouth once a week. Caution:Chemotherapy. Protect from light.   Yes Historical Provider, MD  mirtazapine (REMERON) 15 MG tablet Take 15 mg by mouth at bedtime.   Yes Historical Provider, MD  nicotine (NICODERM CQ - DOSED IN MG/24 HOURS) 21 mg/24hr patch Place 21 mg onto the skin daily.   Yes Historical Provider, MD  sennosides-docusate sodium (SENOKOT-S) 8.6-50 MG tablet Take 1-2 tablets by mouth daily as needed for constipation.   Yes Historical Provider, MD  sodium chloride (OCEAN) 0.65 % SOLN nasal spray Place 2 sprays into both nostrils 4 (four) times daily as needed for congestion.   Yes Historical Provider, MD  triamcinolone cream (KENALOG) 0.1 %  Apply 1 application topically 2 (two) times daily.   Yes Historical Provider, MD  ARIPiprazole (ABILIFY) 5 MG tablet Take 1 tablet (5 mg total) by mouth daily. For depression and anxiety. Patient not taking: Reported on 06/27/2016 07/21/14   Evelina Bucy, MD  atorvastatin (LIPITOR) 20 MG tablet Take 1 tablet (20 mg total) by mouth daily. Patient not taking: Reported on 07/21/2014 05/26/14   Velta Addison Mikhail, DO  meclizine (ANTIVERT) 25 MG tablet Take 1 tablet (25  mg total) by mouth 3 (three) times daily as needed for dizziness. Patient not taking: Reported on 06/27/2016 01/08/14   Noland Fordyce, PA-C  pantoprazole (PROTONIX) 40 MG tablet Take 1 tablet (40 mg total) by mouth daily. Patient not taking: Reported on 07/21/2014 05/26/14   Velta Addison Mikhail, DO  predniSONE (DELTASONE) 20 MG tablet 3 tabs po day one, then 2 po daily x 4 days Patient not taking: Reported on 06/27/2016 07/21/14   Evelina Bucy, MD  tiotropium (SPIRIVA) 18 MCG inhalation capsule Place 1 capsule (18 mcg total) into inhaler and inhale daily. Patient not taking: Reported on 06/27/2016 05/26/14   Cristal Ford, DO   Allergies  Allergen Reactions  . Demerol [Meperidine] Nausea And Vomiting    Violently sick  . Zocor [Simvastatin] Other (See Comments)    Made him very jittery, nausea and vomiting    FAMILY HISTORY:  family history includes Dementia in his father. SOCIAL HISTORY:  reports that he has been smoking Cigarettes.  He has a 10.00 pack-year smoking history. He has never used smokeless tobacco. He reports that he does not drink alcohol or use drugs.  REVIEW OF SYSTEMS:  POSITIVES IN BOLD Constitutional: Negative for fever, chills, weight loss, malaise/fatigue and diaphoresis.  HENT: Negative for hearing loss, ear pain, nosebleeds, congestion, sore throat, neck pain, tinnitus and ear discharge.   Eyes: Negative for blurred vision, double vision, photophobia, pain, discharge and redness.  Respiratory: Negative for cough, hemoptysis, sputum production, shortness of breath, wheezing and stridor.   Cardiovascular: Negative for chest pain, palpitations, orthopnea, claudication, leg swelling and PND.  Gastrointestinal: Negative for heartburn, nausea, vomiting, abdominal pain, diarrhea, constipation, blood in stool and melena.  Genitourinary: Negative for dysuria, urgency, frequency, hematuria and flank pain.  Musculoskeletal: Negative for myalgias, back pain, joint pain and falls.    Skin: Negative for itching and rash.  Neurological: Negative for dizziness, tingling, tremors, sensory change, speech change, focal weakness, seizures, loss of consciousness, weakness and headaches.  Endo/Heme/Allergies: Negative for environmental allergies and polydipsia. Does not bruise/bleed easily.  SUBJECTIVE:  Feels better  VITAL SIGNS: Pulse Rate:  [84-112] 92 (04/30 1500) Resp:  [15-24] 16 (04/30 1500) BP: (120-167)/(65-92) 126/71 (04/30 1500) SpO2:  [86 %-96 %] 94 % (04/30 1500)  PHYSICAL EXAMINATION: General:  Obese male in NAD, sitting on ER stretcher Neuro:  AAOx4, speech clear, MAE  HEENT:  MM pink/moist, thick neck  Cardiovascular:  s1s2 rrr, no m/r/g Lungs:  Even/non-labored, lungs bilaterally clear, L chest tube to 20 cm suction Abdomen:  Obese/soft, bsx4 active  Musculoskeletal:  No acute deformities  Skin:  Warm/dry, no edema    Recent Labs Lab 06/27/16 1121  NA 137  K 4.5  CL 105  CO2 24  BUN 13  CREATININE 1.13  GLUCOSE 150*    Recent Labs Lab 06/27/16 1121  HGB 17.6*  HCT 51.7  WBC 12.8*  PLT 246   Dg Chest 2 View  Result Date: 06/27/2016 CLINICAL DATA:  Sharp pain. EXAM: CHEST  2  VIEW COMPARISON:  07/21/2014. FINDINGS: Left-sided tension pneumothorax is noted with near complete collapse of the left lung. Mild atelectasis right lung base. Cardiomegaly with normal pulmonary vascularity. No acute bony abnormality. IMPRESSION: Left-sided tension pneumothorax with near complete collapse of the left lung. Critical Value/emergent results were called by telephone at the time of interpretation on 06/27/2016 at 11:52 am to nurse Olevia Perches, who verbally acknowledged these results. Electronically Signed   By: Marcello Moores  Register   On: 06/27/2016 11:55   Dg Chest Portable 1 View  Result Date: 06/27/2016 CLINICAL DATA:  Shortness of breath and chest pain status post chest tube insertion EXAM: PORTABLE CHEST 1 VIEW COMPARISON:  06/27/2016 FINDINGS: A left sided  chest tube has been placed with re-expansion of the left lung and decrease in volume of left-sided pneumothorax. Small residual pneumothorax is identified laterally and over the left base. Atelectasis is identified within both lung bases. IMPRESSION: 1. Significant decrease in volume of the left-sided pneumothorax status post chest tube placement. Electronically Signed   By: Kerby Moors M.D.   On: 06/27/2016 13:31    ASSESSMENT / PLAN:  Discussion:  66 y/o M with COPD, OSA on CPAP admitted 4/30 with a spontaneous pneumothorax s/p chest tube placement.  No acute evidence to support COPD exacerbation.  Followed at the [facility] for Pulmonary.    1.  Left Spontaneous Pneumothorax s/p Chest Tube  - continue chest tube to 20 cm suction - defer management to CVTS  - intermittent CXR  - add chest tube flush protocol to prevent clogging / malfunction  2.  COPD without Acute Exacerbation  - continue scheduled xopenex  - continue home Spiriva  - follow up at the [facility] post discharge for Pulmonary   3.  Tobacco Abuse  - smoking cessation counseling   4.  Acute Hypoxic Respiratory Failure - suspect in setting of PTX, recent URI and COPD (? Underlying O2 need) - O2 as needed to support sats 88-95% - will need ambulatory O2 assessment prior to dischargge   5.  OSA on CPAP  - continue nocturnal auto-set CPAP for now.  He may not tolerate with CT / monitor closely  6.  Rheumatoid Arthritis  - defer medications to primary, on methotrexate at baseline  - methotrexate on hold with recent URI  7. Chronic Pain / Depression  - continue amitriptyline, abilify, wellbutrin, cymbalta, oxycodone, ultram    Erick Colace ACNP-BC [place] Pager # [phone] OR # [phone] if no answer   06/27/2016, 3:28 PM   ATTENDING NOTE / ATTESTATION NOTE :   I have discussed the case with the resident/APP  Marni Griffon  I agree with the resident/APP's  history, physical examination, assessment,  and plans.    I have edited the above note and modified it according to our agreed history, physical examination, assessment and plan.   Briefly, 66 year old male w/ h/o COPD and tobacco abuse. Also with OSA severe on cpap.  Has HLA27 arthritis. He developed acute onset of substernal chest pain w/ associated shortness of breath. EMS was called and on arrival sats were 83% on room air. CXR showed left sided tension PTX. A left chest tube was placed per the EDP.  No air leak identified post placement.  Follow up CXR showed resolution of pneumothorax.  He finished levofloxacin more than a week ago for bronchitis. He improved and was at baseline. No recent infection prior to levofloxacin. He had some pulmonary issues with Humira for his  HLA B27 arthritis and he switched to methotrexate recently. Chest x-ray with significant pneumothorax on the left. Chest tube was placed by ER. Repeat chest x-ray with significantly improved pneumothorax.  Pt seen, examined, not in distress.  VSS.  NV not prominent.  Good air entry in both lung fields. No crackles, wheezing, rhonchi. Left chest tube in place. Good cardiac exam. Abdomen was soft, no masses or tenderness. Trace edema. No rashes.   Assessment/Plan : L Pneumothorax - cont chest tube for now - I'm not sure what the trigger for the pneumothorax is. It did not sound that he was persistently coughing from his recent bronchitis. He finished levofloxacin a week ago and was at baseline. - [facility] is primary. We'll defer further imaging and chest tube management to [facility].    COPD. Not in exacerbation - He is not in acute exacerbation of COPD. - continue home spiriva and xopenex/albuterol  - keep o2 sats > 88%   OSA, severe - hold off on cpap tonight 2/2 potentially worsening PTX, unless he desaturates.  - likely we can resume cpap in 1-2 days   Family :  No family at bedside.    Monica Becton, MD 06/27/2016, 5:43 PM [facility] Pager [phone] After 3 pm or if no answer, call [phone]

## 2016-06-28 ENCOUNTER — Encounter (HOSPITAL_COMMUNITY): Payer: Self-pay | Admitting: General Practice

## 2016-06-28 ENCOUNTER — Inpatient Hospital Stay (HOSPITAL_COMMUNITY): Payer: Medicare Other

## 2016-06-28 DIAGNOSIS — J939 Pneumothorax, unspecified: Secondary | ICD-10-CM

## 2016-06-28 DIAGNOSIS — J441 Chronic obstructive pulmonary disease with (acute) exacerbation: Secondary | ICD-10-CM

## 2016-06-28 DIAGNOSIS — G4733 Obstructive sleep apnea (adult) (pediatric): Secondary | ICD-10-CM

## 2016-06-28 DIAGNOSIS — Z9989 Dependence on other enabling machines and devices: Secondary | ICD-10-CM

## 2016-06-28 DIAGNOSIS — J9311 Primary spontaneous pneumothorax: Secondary | ICD-10-CM

## 2016-06-28 DIAGNOSIS — R0902 Hypoxemia: Secondary | ICD-10-CM

## 2016-06-28 LAB — PROTIME-INR
INR: 0.98
Prothrombin Time: 13 seconds (ref 11.4–15.2)

## 2016-06-28 MED ORDER — LISINOPRIL 10 MG PO TABS
10.0000 mg | ORAL_TABLET | Freq: Every day | ORAL | Status: DC
  Administered 2016-06-28 – 2016-07-07 (×10): 10 mg via ORAL
  Filled 2016-06-28 (×10): qty 1

## 2016-06-28 NOTE — Progress Notes (Addendum)
      [address]       [place] [address]             [phone]           Subjective: Patient states breathing is better now that he had a breathing treatment. He has pain to left of sternum, which he had at admission.  Objective: Vital signs in last 24 hours: Temp:  [97.7 F (36.5 C)-98 F (36.7 C)] 97.9 F (36.6 C) (05/01 0416) Pulse Rate:  [72-112] 96 (05/01 0639) Cardiac Rhythm: Sinus tachycardia (04/30 1900) Resp:  [15-24] 18 (05/01 0639) BP: (100-167)/(52-92) 100/57 (05/01 0416) SpO2:  [86 %-96 %] 89 % (05/01 0416) Weight:  [146.7 kg (323 lb 6.4 oz)] 146.7 kg (323 lb 6.4 oz) (04/30 1737)     Intake/Output from previous day: 04/30 0701 - 05/01 0700 In: 480 [P.O.:480] Out: 625 [Urine:625]   Physical Exam:  Cardiovascular: RRR Pulmonary: Mostly clear to auscultation bilaterally Chest Tube: to suction, no air leak  Lab Results: CBC: Recent Labs  06/27/16 1121 06/27/16 1711  WBC 12.8* 15.8*  HGB 17.6* 17.4*  HCT 51.7 51.4  PLT 246 248   BMET:  Recent Labs  06/27/16 1121 06/27/16 1711  NA 137  --   K 4.5  --   CL 105  --   CO2 24  --   GLUCOSE 150*  --   BUN 13  --   CREATININE 1.13 1.34*  CALCIUM 8.5*  --     PT/INR:  Recent Labs  06/28/16 0208  LABPROT 13.0  INR 0.98   ABG:  INR: Will add last result for INR, ABG once components are confirmed Will add last 4 CBG results once components are confirmed  Assessment/Plan:  1. CV - SR in the 90's. On Lisinopril 20 mg daily as taken pre op. SBP mostly in low 100's. Will decrease to 10 mg daily for now and monitor BP and creatinine (as slightly increased as 1.34). 2.  Pulmonary - On 5 liters of oxygen via [place]. CXR this am appears to show minor right lateral chest wall subcutaneous emphysema, no pneumothorax, and appears chest tube has moved back. Hope to place chest tube to water seal. Check CXR in am. 3. OSA- patient unable to tolerate CPAP (mask does not fit)  from here  so is going to have someone bring in his mask from home.  ZIMMERMAN,DONIELLE MPA-C 06/28/2016,7:12 AM  no air leak today  Chest tube on suction  Poss water seal in am I have seen and examined Mario [name] and agree with the above assessment  and plan.  Grace Isaac MD Beeper [phone] Office [phone] 06/28/2016 2:23 PM

## 2016-06-28 NOTE — Progress Notes (Signed)
Name: Mario [name] MRN: [id number] DOB: 1950/03/05    ADMISSION DATE:  06/27/2016 CONSULTATION DATE:  4/30  REFERRING MD :  Servando Snare   CHIEF COMPLAINT/reason for consult COPD  BRIEF PATIENT DESCRIPTION:  66 year old male w/ h/o COPD and tobacco abuse. He developed acute onset of substernal chest pain w/ associated shortness of breath. EMS was called and on arrival sats were 83% on room air. CXR showed left sided tension PTX. A left chest tube was placed per the EDP.  No air leak identified post placement.  Follow up CXR showed resolution of pneumothorax.    SUBJECTIVE:  States he is very sore at chest tube site.Otherwise his breathing is at baseline  VITAL SIGNS: Temp:  [97.7 F (36.5 C)-98 F (36.7 C)] 97.9 F (36.6 C) (05/01 0416) Pulse Rate:  [72-112] 96 (05/01 0639) Resp:  [15-18] 18 (05/01 0639) BP: (100-167)/(52-92) 121/62 (05/01 0853) SpO2:  [86 %-96 %] 89 % (05/01 0416) Weight:  [323 lb 6.4 oz (146.7 kg)] 323 lb 6.4 oz (146.7 kg) (04/30 1737)  PHYSICAL EXAMINATION: General:  Obese male in NAD, sitting on side of bed Neuro:  AAOx4, speech clear, MAE x 4, appropriate  HEENT:  MM pink/moist, thick neck, No JVD  Cardiovascular:  RRR, S1,S2, no RMG Lungs:  Even/non-labored, lungs bilaterally clear, L chest tube to 20 cm suction Abdomen:  Obese/soft, non-distended bsx4 +  Musculoskeletal:  No acute deformities noted,   Skin:  Warm/dry, intact,no edema, lesions or rash noted    Recent Labs Lab 06/27/16 1121 06/27/16 1711  NA 137  --   K 4.5  --   CL 105  --   CO2 24  --   BUN 13  --   CREATININE 1.13 1.34*  GLUCOSE 150*  --     Recent Labs Lab 06/27/16 1121 06/27/16 1711  HGB 17.6* 17.4*  HCT 51.7 51.4  WBC 12.8* 15.8*  PLT 246 248   Dg Chest 2 View  Result Date: 06/27/2016 CLINICAL DATA:  Sharp pain. EXAM: CHEST  2 VIEW COMPARISON:  07/21/2014. FINDINGS: Left-sided tension pneumothorax is noted with near complete collapse of the left lung. Mild  atelectasis right lung base. Cardiomegaly with normal pulmonary vascularity. No acute bony abnormality. IMPRESSION: Left-sided tension pneumothorax with near complete collapse of the left lung. Critical Value/emergent results were called by telephone at the time of interpretation on 06/27/2016 at 11:52 am to nurse Olevia Perches, who verbally acknowledged these results. Electronically Signed   By: Marcello Moores  Register   On: 06/27/2016 11:55   Dg Chest Port 1 View  Result Date: 06/28/2016 CLINICAL DATA:  Pneumothorax. EXAM: PORTABLE CHEST 1 VIEW COMPARISON:  06/27/2016. FINDINGS: Left chest tube noted. No pneumothorax noted on today's exam. Heart size normal. Low lung volumes with basilar atelectasis. No acute bony abnormality identified. Mild left chest wall subcutaneous emphysema . IMPRESSION: 1. Left chest tube noted.  No pneumothorax noted on today's exam. 2.  Low lung volumes with basilar atelectasis. Electronically Signed   By: Marcello Moores  Register   On: 06/28/2016 07:56   Dg Chest Portable 1 View  Result Date: 06/27/2016 CLINICAL DATA:  Shortness of breath and chest pain status post chest tube insertion EXAM: PORTABLE CHEST 1 VIEW COMPARISON:  06/27/2016 FINDINGS: A left sided chest tube has been placed with re-expansion of the left lung and decrease in volume of left-sided pneumothorax. Small residual pneumothorax is identified laterally and over the left base. Atelectasis is identified within both lung bases. IMPRESSION: 1.  Significant decrease in volume of the left-sided pneumothorax status post chest tube placement. Electronically Signed   By: Kerby Moors M.D.   On: 06/27/2016 13:31    ASSESSMENT / PLAN:  Discussion:  66 y/o M with COPD, OSA on CPAP admitted 4/30 with a spontaneous pneumothorax s/p chest tube placement.  No acute evidence to support COPD exacerbation. He completed  Treatment  1 week PTA for bronchitis with Levaquin. 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    1.  Left Spontaneous Pneumothorax s/p Chest Tube       No residual Pneumo per CXR 5/1      Low lung volumes with basilar atelectasis - continue chest tube to water seal per CVTS - defer management to CVTS  - intermittent CXR  - consider adding  chest tube flush protocol to prevent clogging / malfunction  2.  COPD without Acute Exacerbation  - continue scheduled xopenex/ albuterol  - continue home Spiriva  - follow up at the [facility] post discharge for Pulmonary   3.  Tobacco Abuse  - smoking cessation counseling  - Be Stronger than your Excuses card given with community resources to assist in      Smoking cessation  4.  Acute Hypoxic Respiratory Failure - suspect in setting of PTX, recent URI and COPD (? Underlying O2 need) - O2 as needed to support sats 88-93% - will need ambulatory O2 assessment prior to discharge   5.  OSA  Severe on CPAP  - resume    nocturnal auto-set CPAP starting 5/1 - Have family bring mask from home as hospital mask does not fit  - Monitor closely for tolerance  With recent pneumo   6.  Rheumatoid Arthritis  - defer medications to primary, on methotrexate at baseline  - methotrexate on hold with recent URI  7. Chronic Pain / Depression  - continue amitriptyline, abilify, wellbutrin, cymbalta, oxycodone, ultram  Magdalen Spatz, AGACNP-BC [facility] Pulmonary/Critical Care Medicine Pager # [phone]  Pager [phone] after 12 noon   06/28/2016, 12:29 PM  Attending Note:  66 year old male with COPD and OSA presenting to PCCM with spontaneous PTX.  On exam, lungs are clear and CT in place.  I reviewed CXR myself, CT in good position.  Discussed with PCCM-NP.  PTX:  - CT to water seal  - CVTS following  COPD:  - Xopenex  - Spiriva  - F/U in [place]  OSA:  - Auto-PAP  - Use with care in the setting of  a PTX  Hypoxemia:  - Titrate O2 for sat of 88-92%  - Home O2  PCCM will sign off, please call back if needed.  Patient seen and examined, agree with above note.  I dictated the care and orders written for this patient under my direction.  Rush Farmer, MD [phone]

## 2016-06-29 ENCOUNTER — Inpatient Hospital Stay (HOSPITAL_COMMUNITY): Payer: Medicare Other

## 2016-06-29 DIAGNOSIS — J9311 Primary spontaneous pneumothorax: Secondary | ICD-10-CM

## 2016-06-29 LAB — BASIC METABOLIC PANEL
Anion gap: 8 (ref 5–15)
BUN: 20 mg/dL (ref 6–20)
CO2: 26 mmol/L (ref 22–32)
Calcium: 8.6 mg/dL — ABNORMAL LOW (ref 8.9–10.3)
Chloride: 102 mmol/L (ref 101–111)
Creatinine, Ser: 1.11 mg/dL (ref 0.61–1.24)
GFR calc Af Amer: >60 mL/min (ref >60)
GFR calc non Af Amer: >60 mL/min (ref >60)
Glucose, Bld: 95 mg/dL (ref 65–99)
Potassium: 4.7 mmol/L (ref 3.5–5.1)
Sodium: 136 mmol/L (ref 135–145)

## 2016-06-29 NOTE — Progress Notes (Addendum)
      [address]       [place] [address]             [phone]           Subjective: Patient just finished breakfast and requesting pain medication.  Objective: Vital signs in last 24 hours: Temp:  [97.7 F (36.5 C)-97.8 F (36.6 C)] 97.7 F (36.5 C) (05/02 0410) Pulse Rate:  [66-94] 71 (05/02 0410) Cardiac Rhythm: Normal sinus rhythm (05/02 0700) Resp:  [16-20] 16 (05/02 0410) BP: (117-130)/(62-82) 117/82 (05/02 0410) SpO2:  [90 %-95 %] 93 % (05/02 0410)     Intake/Output from previous day: 05/01 0701 - 05/02 0700 In: 840 [P.O.:840] Out: 1655 [Urine:1625; Chest Tube:30]   Physical Exam:  Cardiovascular: RRR Pulmonary: Mostly clear to auscultation bilaterally Chest Tube: to suction, no air leak  Lab Results: CBC:  Recent Labs  06/27/16 1121 06/27/16 1711  WBC 12.8* 15.8*  HGB 17.6* 17.4*  HCT 51.7 51.4  PLT 246 248   BMET:   Recent Labs  06/27/16 1121 06/27/16 1711 06/29/16 0526  NA 137  --  136  K 4.5  --  4.7  CL 105  --  102  CO2 24  --  26  GLUCOSE 150*  --  95  BUN 13  --  20  CREATININE 1.13 1.34* 1.11  CALCIUM 8.5*  --  8.6*    PT/INR:   Recent Labs  06/28/16 0208  LABPROT 13.0  INR 0.98   ABG:  INR: Will add last result for INR, ABG once components are confirmed Will add last 4 CBG results once components are confirmed  Assessment/Plan:  1. CV - SR in the 70's. On Lisinopril 10 mg daily. 2.  Pulmonary - On 5 liters of oxygen via [place]. CXR this am shows no pneumothorax and bibasilar atelectasis.  Place chest tube to water seal. Check CXR in am. 3. OSA- patient unable to tolerate CPAP (mask does not fit)  from here so is going to have someone bring in his mask from home.  ZIMMERMAN,DONIELLE MPA-C 06/29/2016,7:14 AM   Wheezing today , getting pul rx now I have seen and examined Mario [name] and agree with the above assessment  and plan.  Grace Isaac MD Beeper [phone] Office  [phone] 06/29/2016 3:17 PM

## 2016-06-30 ENCOUNTER — Inpatient Hospital Stay (HOSPITAL_COMMUNITY): Payer: Medicare Other

## 2016-06-30 NOTE — Progress Notes (Signed)
Placed patient on CPAP for the night with pressure set at 18cm and oxygen set at 4lpm

## 2016-06-30 NOTE — Discharge Instructions (Signed)
Pneumothorax A pneumothorax, commonly called a collapsed lung, is a condition in which air leaks from a lung and builds up in the space between the lung and the chest wall (pleural space). The air in a pneumothorax is trapped outside the lung and takes up space, preventing the lung from fully expanding. This is a condition that usually occurs suddenly. The buildup of air may be small or large. A small pneumothorax may go away on its own. When a pneumothorax is larger, it will often require medical treatment and hospitalization. What are the causes? A pneumothorax can sometimes happen quickly with no apparent cause. People with underlying lung problems, particularly COPD or emphysema, are at higher risk of pneumothorax. However, pneumothorax can happen quickly even in people with no prior known lung problems. Trauma, surgery, medical procedures, or injury to the chest wall can also cause a pneumothorax. What are the signs or symptoms? Sometimes a pneumothorax will have no symptoms. When symptoms are present, they can include:  Chest pain.  Shortness of breath.  Increased rate of breathing.  Bluish color to your lips or skin (cyanosis). How is this diagnosed? Pneumothorax is usually diagnosed by a chest X-ray or chest CT scan. Your health care provider will also take a medical history and perform a physical exam to determine why you may have a pneumothorax. How is this treated? A small pneumothorax may go away on its own without treatment. Extra oxygen can sometimes help a small pneumothorax go away more quickly. For a larger pneumothorax or a pneumothorax that is causing symptoms, a procedure is usually needed to drain the air.In some cases, the health care provider may drain the air using a needle. In other cases, a chest tube may be inserted into the pleural space. A chest tube is a small tube placed between the ribs and into the pleural space. This removes the extra air and allows the lung to  expand back to its normal size. A large pneumothorax will usually require a hospital stay. If there is ongoing air leakage into the pleural space, then the chest tube may need to remain in place for several days until the air leak has healed. In some cases, surgery may be needed. Follow these instructions at home:  Only take over-the-counter or prescription medicines as directed by your health care provider.  If a cough or pain makes it difficult for you to sleep at night, try sleeping in a semi-upright position in a recliner or by using 2 or 3 pillows.  Rest and limit activity as directed by your health care provider.  If you had a chest tube and it was removed, ask your health care provider when it is okay to remove the dressing. Until your health care provider says you can remove the dressing, do not allow it to get wet.  Do not smoke. Smoking is a risk factor for pneumothorax.  Do not fly in an airplane or scuba dive until your health care provider says it is okay.  Follow up with your health care provider as directed. Get help right away if:  You have increasing chest pain or shortness of breath.  You have a cough that is not controlled with suppressants.  You begin coughing up blood.  You have pain that is getting worse or is not controlled with medicines.  You cough up thick, discolored mucus (sputum) that is yellow to green in color.  You have redness, increasing pain, or discharge at the site where a chest  tube had been in place (if your pneumothorax was treated with a chest tube).  The site where your chest tube was located opens up.  You feel air coming out of the site where the chest tube was placed.  You have a fever or persistent symptoms for more than 2-3 days.  You have a fever and your symptoms suddenly get worse. This information is not intended to replace advice given to you by your health care provider. Make sure you discuss any questions you have with your  health care provider. Document Released: 02/14/2005 Document Revised: 07/23/2015 Document Reviewed: 07/10/2013 Elsevier Interactive Patient Education  2017 [employer].

## 2016-06-30 NOTE — Discharge Summary (Signed)
Physician Discharge Summary       [address]       [place] [address]             [phone]    Patient ID: Mario [name] MRN: [id number] DOB/AGE: 1950-05-16 66 y.o.  Admit date: 06/27/2016 Discharge date: 07/07/2016  Admission Diagnoses: Spontaneous left tension pneumothorax  Active Diagnoses:  1. COPD(chronic obstructive pulmonary disease) ([place]) 2. Hypertension 3. Tobacco abuse 4. Anxiety 5. Depression 6. OSA (obstructive sleep apnea) 7. MI (myocardial infarction) ([place]) 8. Suicide attempt ([place]) 9. Basal cell skin cancer on nose  Consults: pulmonary/intensive care and IR  Procedure (s): Mini left chest tube placed by Dr. Leonette Monarch on 06/27/2016.  History of Presenting Illness: This is a 66 year old Caucasian male with a history of tobacco abuse and COPD who presented to [facility] ED with complaints of substernal chest pain that worsened with movement and shortness of breath. He states the aformentioned symptoms started earlier this morning around 3 am. His oxygenation was 83% on room air upon arrival to the ED. CXR showed left tension pneumothorax with near collapse of the left lung. Dr. Leonette Monarch placed a left chest tube. Chest tube has NO air leak. Follow up chest x ray showed re expansion of the left lung with only a small pneumothorax left lateral chest wall and base. Dr. Servando Snare has been consulted to manage the chest tube. At the time of my exam, patient was in no acute distress. Vital signs were  BP 133/73   Pulse  93   Resp 17   SpO2 93% on aerosol mask. He was admitted by Dr. Newell Coral for further evaluation and management of the left spontaneous pneumothorax.  Brief Hospital Course:  He remained afebrile and hemodynamically stable. He was restarted on Spiriva and Combivent PRN (history of COPD). He was give daily Xopenex breathing treatments tid. He was on 4-5l liters of oxygen via [place].  Initially, his chest tubeafter placement was to water seal. We placed it  to 20 cm of suction as he had a small pneumothorax after chest tube placement. Daily chest x rays were obtained and remained stable. Chest tube was placed to water seal on 06/29/2016 and there was no air leak. Chest tube was removed on 06/30/2016. Follow up chest x ray showed a recurrent left pneumothorax (20-30%). As discussed with Dr. Servando Snare, IR placed a 12 French pigtail catheter on 07/01/2016. Chest tube did not have an air leak. Daily chest x rays were obtained and remained stable ( no pneumothorax). Per Dr. Servando Snare, [facility] slurry was placed on 07/04/2016. Follow up chest x ray remained stable (no pneumothorax).He has been weaned to 2 liters of oxgyen via [place]. He will  require home oxygen at discharge as he has desaturation into the high 80's with ambulation on room air. Chest tube was removed on 07/05/2016. Chest x ray done this morning showed no re accumulation of left sided pneumothorax, persistent left basilar atelectasis (?pneumonia).  He expectorated green bloody sputum.  Will give him a 10 day course of Augmentin for possible pneumonia vs. Bronchitis.  Per Dr. Servando Snare, patient is surgically stable for discharge.  Latest Vital Signs: Blood pressure (!) 117/40, pulse 69, temperature 98.2 F (36.8 C), temperature source Oral, resp. rate 18, height '6\' 2"'$  (1.88 m), weight (!) 330 lb 1.6 oz (149.7 kg), SpO2 99 %.  Physical Exam: Cardiovascular: Slightly tachy Pulmonary: Mostly clear to auscultation bilaterally  Discharge Condition:Stable and discharged to home.  Recent laboratory  studies:  Lab Results  Component Value Date   WBC 15.8 (H) 06/27/2016   HGB 17.4 (H) 06/27/2016   HCT 51.4 06/27/2016   MCV 100.4 (H) 06/27/2016   PLT 248 06/27/2016   Lab Results  Component Value Date   NA 136 06/29/2016   K 4.7 06/29/2016   CL 102 06/29/2016   CO2 26 06/29/2016   CREATININE 1.13 07/04/2016   GLUCOSE 95 06/29/2016    Diagnostic Studies:   Dg Chest 2 View  Result Date:  06/27/2016 CLINICAL DATA:  Sharp pain. EXAM: CHEST  2 VIEW COMPARISON:  07/21/2014. FINDINGS: Left-sided tension pneumothorax is noted with near complete collapse of the left lung. Mild atelectasis right lung base. Cardiomegaly with normal pulmonary vascularity. No acute bony abnormality. IMPRESSION: Left-sided tension pneumothorax with near complete collapse of the left lung. Critical Value/emergent results were called by telephone at the time of interpretation on 06/27/2016 at 11:52 am to nurse Olevia Perches, who verbally acknowledged these results. Electronically Signed   By: Marcello Moores  Register   On: 06/27/2016 11:55   CLINICAL DATA:  Left-sided chest tube removal yesterday. Persistent left-sided chest pain. History of COPD, previous MI, left pneumothorax.  EXAM: CHEST  2 VIEW  COMPARISON:  Portable chest x-ray of Jul 05, 2016.  FINDINGS: The small caliber left-sided chest tube has been removed. There is no pneumothorax. There is persistent left basilar atelectasis or less likely infiltrate. The right lung is adequately inflated. Minimal right basilar interstitial density is present but has improved. The heart is normal in size. The pulmonary vascularity is not engorged. The mediastinum is normal in width. The bony thorax exhibits no acute abnormality.  IMPRESSION: No reaccumulation of left-sided pneumothorax since chest tube removal. There is persistent left basilar atelectasis or pneumonia. Minimal right basilar atelectasis persists but is less conspicuous today.   Electronically Signed   By: David  Martinique M.D.   On: 07/06/2016 08:05   Discharge Medications: Allergies as of 07/07/2016      Reactions   Demerol [meperidine] Nausea And Vomiting   Violently sick   Zocor [simvastatin] Other (See Comments)   Made him very jittery, nausea and vomiting      Medication List    STOP taking these medications   albuterol 108 (90 Base) MCG/ACT inhaler Commonly known as:  PROVENTIL  HFA;VENTOLIN HFA   amitriptyline 10 MG tablet Commonly known as:  ELAVIL   predniSONE 20 MG tablet Commonly known as:  DELTASONE     TAKE these medications   amoxicillin-clavulanate 875-125 MG tablet Commonly known as:  AUGMENTIN Take 1 tablet by mouth 2 (two) times daily.   ARIPiprazole 5 MG tablet Commonly known as:  ABILIFY Take 1 tablet (5 mg total) by mouth daily. For depression and anxiety.   ARTIFICIAL TEARS OP Place 1 drop into both eyes daily as needed (dry eyes).   aspirin 81 MG tablet Take 81 mg by mouth daily.   atorvastatin 20 MG tablet Commonly known as:  LIPITOR Take 1 tablet (20 mg total) by mouth daily.   buPROPion 150 MG 12 hr tablet Commonly known as:  WELLBUTRIN SR Take 300 mg by mouth every morning.   carboxymethylcellulose 1 % ophthalmic solution Place 1 drop into both eyes at bedtime.   clotrimazole 1 % cream Commonly known as:  LOTRIMIN Apply 1 application topically 2 (two) times daily.   COMBIVENT RESPIMAT 20-100 MCG/ACT Aers respimat Generic drug:  Ipratropium-Albuterol Inhale 1 puff into the lungs every 6 (six) hours as needed  for wheezing.   diclofenac 75 MG EC tablet Commonly known as:  VOLTAREN Take 75 mg by mouth 2 (two) times daily.   DULoxetine 30 MG capsule Commonly known as:  CYMBALTA Take 1 capsule (30 mg total) by mouth daily.   flunisolide 25 MCG/ACT (0.025%) Soln Commonly known as:  NASALIDE Place 1 spray into both nostrils daily.   folic acid 1 MG tablet Commonly known as:  FOLVITE Take 1 mg by mouth daily.   gabapentin 300 MG capsule Commonly known as:  NEURONTIN Take 2 capsules (600 mg total) by mouth 2 (two) times daily.   lisinopril 20 MG tablet Commonly known as:  PRINIVIL,ZESTRIL Take 1 tablet (20 mg total) by mouth daily. For high blood pressure   meclizine 25 MG tablet Commonly known as:  ANTIVERT Take 1 tablet (25 mg total) by mouth 3 (three) times daily as needed for dizziness.   methotrexate 2.5  MG tablet Commonly known as:  RHEUMATREX Take 15 mg by mouth once a week. Caution:Chemotherapy. Protect from light.   mirtazapine 15 MG tablet Commonly known as:  REMERON Take 15 mg by mouth at bedtime.   nicotine 21 mg/24hr patch Commonly known as:  NICODERM CQ - dosed in mg/24 hours Place 21 mg onto the skin daily.   Oxycodone HCl 10 MG Tabs Take 10 mg by mouth every 4-6 hours PRN severe pain.   pantoprazole 40 MG tablet Commonly known as:  PROTONIX Take 1 tablet (40 mg total) by mouth daily.   prednisoLONE acetate 1 % ophthalmic suspension Commonly known as:  PRED FORTE Place 1 drop into the right eye every 2 (two) hours.   sennosides-docusate sodium 8.6-50 MG tablet Commonly known as:  SENOKOT-S Take 1-2 tablets by mouth daily as needed for constipation.   sodium chloride 0.65 % Soln nasal spray Commonly known as:  OCEAN Place 2 sprays into both nostrils 4 (four) times daily as needed for congestion.   tiotropium 18 MCG inhalation capsule Commonly known as:  SPIRIVA Place 1 capsule (18 mcg total) into inhaler and inhale daily.   triamcinolone cream 0.1 % Commonly known as:  KENALOG Apply 1 application topically 2 (two) times daily.   Vitamin D 2000 units Caps Take 2,000 Units by mouth daily.            Durable Medical Equipment        Start     Ordered   07/05/16 0715  For home use only DME oxygen  Once    Question Answer Comment  Mode or (Route) Nasal cannula   Liters per Minute 2   Oxygen delivery system Gas      07/05/16 0721      Follow Up Appointments: Follow-up Information    Grace Isaac, MD. Call on 07/11/2016.   Specialty:  Cardiothoracic Surgery Why:  PA/LAT CXR to be taken (at [facility] which is in the same building as Dr. Emelda Brothers office) on 07/11/2016 at 1:45 [id number] time is at 2:15 pm and is with the physician assistant Contact information: [address] [place]  [address] [phone]        Eric Form NP Follow up on 07/14/2016.   Why:  Appointment time is at 9:15 am Contact information: [address] [place] [address]          Signed: Cinda Quest 07/07/2016, 8:01 AM

## 2016-06-30 NOTE — Progress Notes (Addendum)
      [address]       [place] [address]             [phone]           Subjective: Patient receiving breathing treatment, which he states helps his breathing a lot  Objective: Vital signs in last 24 hours: Temp:  [97.8 F (36.6 C)-98.5 F (36.9 C)] 97.8 F (36.6 C) (05/03 0452) Pulse Rate:  [73-78] 78 (05/03 0452) Cardiac Rhythm: Normal sinus rhythm (05/03 0700) Resp:  [18] 18 (05/03 0452) BP: (102-137)/(56-92) 137/92 (05/03 0452) SpO2:  [93 %-98 %] 96 % (05/03 0452)     Intake/Output from previous day: 05/02 0701 - 05/03 0700 In: 1580 [P.O.:1580] Out: 2360 [Urine:2325; Stool:1; Chest Tube:34]   Physical Exam:  Cardiovascular: RRR Pulmonary: Mostly clear to auscultation bilaterally Chest Tube: to water seal and no air leak  Lab Results: CBC:  Recent Labs  06/27/16 1121 06/27/16 1711  WBC 12.8* 15.8*  HGB 17.6* 17.4*  HCT 51.7 51.4  PLT 246 248   BMET:   Recent Labs  06/27/16 1121 06/27/16 1711 06/29/16 0526  NA 137  --  136  K 4.5  --  4.7  CL 105  --  102  CO2 24  --  26  GLUCOSE 150*  --  95  BUN 13  --  20  CREATININE 1.13 1.34* 1.11  CALCIUM 8.5*  --  8.6*    PT/INR:   Recent Labs  06/28/16 0208  LABPROT 13.0  INR 0.98   ABG:  INR: Will add last result for INR, ABG once components are confirmed Will add last 4 CBG results once components are confirmed  Assessment/Plan:  1. CV - SR in the 70's. On Lisinopril 10 mg daily. 2.  Pulmonary - On 5 liters of oxygen via [place]. CXR this am shows no pneumothorax and bibasilar atelectasis. Remove chest tube today. Continue Xopenex tid. Check CXR in am. 3. OSA- using CPAP mask from home.. 4. If CXR stable in am, will discharge. Will arrange for follow up with Dr. Servando Snare as well as pulmonary.  ZIMMERMAN,DONIELLE MPA-C 06/30/2016,7:30 AM

## 2016-07-01 ENCOUNTER — Inpatient Hospital Stay (HOSPITAL_COMMUNITY): Payer: Medicare Other

## 2016-07-01 ENCOUNTER — Encounter (HOSPITAL_COMMUNITY): Payer: Self-pay | Admitting: General Surgery

## 2016-07-01 MED ORDER — FENTANYL CITRATE (PF) 100 MCG/2ML IJ SOLN
INTRAMUSCULAR | Status: AC
  Filled 2016-07-01: qty 2

## 2016-07-01 MED ORDER — OXYCODONE HCL 5 MG PO TABS
10.0000 mg | ORAL_TABLET | ORAL | Status: DC | PRN
  Administered 2016-07-01 – 2016-07-07 (×40): 10 mg via ORAL
  Filled 2016-07-01 (×40): qty 2

## 2016-07-01 MED ORDER — FENTANYL CITRATE (PF) 100 MCG/2ML IJ SOLN
INTRAMUSCULAR | Status: AC | PRN
  Administered 2016-07-01: 25 ug via INTRAVENOUS
  Administered 2016-07-01: 50 ug via INTRAVENOUS

## 2016-07-01 MED ORDER — SODIUM CHLORIDE 0.9 % IV SOLN
INTRAVENOUS | Status: AC | PRN
Start: 2016-07-01 — End: 2016-07-01
  Administered 2016-07-01: 10 mL/h via INTRAVENOUS

## 2016-07-01 MED ORDER — MIDAZOLAM HCL 2 MG/2ML IJ SOLN
INTRAMUSCULAR | Status: AC
  Filled 2016-07-01: qty 2

## 2016-07-01 MED ORDER — LIDOCAINE HCL 1 % IJ SOLN
INTRAMUSCULAR | Status: AC
  Filled 2016-07-01: qty 20

## 2016-07-01 MED ORDER — CALCIUM CARBONATE ANTACID 500 MG PO CHEW
400.0000 mg | CHEWABLE_TABLET | Freq: Three times a day (TID) | ORAL | Status: DC | PRN
  Administered 2016-07-01: 400 mg via ORAL
  Filled 2016-07-01: qty 2

## 2016-07-01 MED ORDER — MIDAZOLAM HCL 2 MG/2ML IJ SOLN
INTRAMUSCULAR | Status: AC | PRN
  Administered 2016-07-01: 1 mg via INTRAVENOUS
  Administered 2016-07-01 (×2): 0.5 mg via INTRAVENOUS

## 2016-07-01 NOTE — Plan of Care (Signed)
Problem: Physical Regulation: Goal: Ability to maintain clinical measurements within normal limits will improve Outcome: Not Met (add Reason) Pt had pigtail chest tube placed today due to re-spontanious PE

## 2016-07-01 NOTE — Sedation Documentation (Signed)
Patient is resting comfortably. 

## 2016-07-01 NOTE — Procedures (Signed)
Interventional Radiology Procedure Note  Procedure: CT guided placement of left chest tube  Complications: None  Estimated Blood Loss: < 10 mL  Findings:  12 Fr pigtail chest tube placed in left pleural space.  Connected to [place] device.  Will connect to wall suction at -20 cm H2O.  Venetia Night. Kathlene Cote, M.D Pager:  [phone]

## 2016-07-01 NOTE — Consult Note (Signed)
.   Chief Complaint: left recurrent pneumothorax  Referring Physician:Dr. Lanelle Bal  Supervising Physician: Aletta Edouard  Patient Status: [facility] - In-pt  HPI: Mario [name] is a 66 y.o. male who was admitted on 4/30 secondary to a tension PTX on the left.  He had a chest tube placed by the EDP.  It was in for several day and removed yesterday after his CXR showed resolution.  On today's x-ray there is a recurrence of his PTX.  We have been requested to replace his chest tube.    Past Medical History:  Past Medical History:  Diagnosis Date  . Anxiety   . Bronchitis   . COPD (chronic obstructive pulmonary disease) ([place])   . Depression   . Hypertension   . Mental disorder   . MI (myocardial infarction) ([place])    ????  . OSA (obstructive sleep apnea)   . Suicide attempt ([place])   . Tension pneumothorax 06/27/2016    Past Surgical History:  Past Surgical History:  Procedure Laterality Date  . CHEST TUBE INSERTION Left 06/27/2016  . cryptorchidism    . SKIN CANCER EXCISION      Family History:  Family History  Problem Relation Age of Onset  . Dementia Father     Social History:  reports that he has been smoking Cigarettes.  He has a 10.00 pack-year smoking history. He has never used smokeless tobacco. He reports that he does not drink alcohol or use drugs.  Allergies:  Allergies  Allergen Reactions  . Demerol [Meperidine] Nausea And Vomiting    Violently sick  . Zocor [Simvastatin] Other (See Comments)    Made him very jittery, nausea and vomiting    Medications: Medications reviewed in epic  Please HPI for pertinent positives, otherwise complete 10 system ROS negative.  Mallampati Score: MD Evaluation Airway: WNL Heart: WNL Abdomen: WNL Chest/ Lungs: Other (comments) Chest/ lungs comments: decreased breath sounds on left secondary to PTX ASA  Classification: 3 Mallampati/Airway Score: One  Physical Exam: BP (!) 146/94 (BP Location: Left Arm)    Pulse 99   Temp 97.4 F (36.3 C) (Axillary)   Resp 18   Ht '6\' 2"'$  (1.88 m)   Wt (!) 323 lb 6.4 oz (146.7 kg)   SpO2 99%   BMI 41.52 kg/m  Body mass index is 41.52 kg/m. General: pleasant, obese white male who is laying in bed in NAD HEENT: head is normocephalic, atraumatic.  Sclera are noninjected.  PERRL.  Ears and nose without any masses or lesions.  Mouth is pink and moist Heart: regular, rate, and rhythm.  Normal s1,s2. No obvious murmurs, gallops, or rubs noted.  Palpable radial and pedal pulses bilaterally Lungs: CTAB, no wheezes, rhonchi, or rales noted.  Respiratory effort nonlabored with [place] in place.  Decreased breath sounds on the left. Abd: soft, NT, obese, +BS, no masses, hernias, or organomegaly Psych: A&Ox3 with an appropriate affect.   Labs: No results found for this or any previous visit (from the past 48 hour(s)).  Imaging: Dg Chest 1 View  Result Date: 06/30/2016 CLINICAL DATA:  Left pneumothorax with chest tube treatment EXAM: CHEST 1 VIEW COMPARISON:  Portable chest x-ray of Jun 29, 2016 and Jul 21, 2014. FINDINGS: The image is obtained in a lordotic manner. The lungs are mildly hypoinflated. Bibasilar densities persist but are stable. There is no pleural effusion or new visible pneumothorax today. The small caliber chest tube projects over the lateral aspect of the mid upper hemithorax overlying  the lateral aspect of the fourth rib. The heart and pulmonary vascularity are normal. IMPRESSION: Bibasilar atelectasis, stable.  No residual left-sided pneumothorax. Electronically Signed   By: David  Martinique M.D.   On: 06/30/2016 07:32   Dg Chest 2 View  Result Date: 07/01/2016 CLINICAL DATA:  66 year old on CPAP at night for the past 3 years, presenting with acute onset of shortness of breath after removing his CPAP. Patient had a left chest tube removed on 06/29/2016 for a pneumothorax. EXAM: CHEST  2 VIEW COMPARISON:  06/30/2016, 06/29/2016 and earlier. FINDINGS: Recurrent  left pneumothorax on the order of 20-30% or so. The left lung at the base is tethered to the pleural by scar. The pneumothorax is predominantly lateral in location. Associated passive atelectasis in the left lower lobe, increased since yesterday. Lungs otherwise clear. Emphysematous changes in the upper lobes, prominent bronchovascular markings diffusely and mild central peribronchial thickening are unchanged. Cardiac silhouette normal in size, unchanged. Thoracic aorta atherosclerotic, unchanged. Hilar and mediastinal contours otherwise unremarkable. Degenerative changes and DISH involving the thoracic spine. IMPRESSION: 1. Recurrent left pneumothorax on the order of 20 through 30% or so, predominantly lateral in location. The left lung at the base is tethered to the pleural by scar. 2. Associated mild passive atelectasis in the left lower lobe, increased since yesterday. 3. Stable baseline COPD/emphysema. I telephoned this critical value/emergent result at the time of interpretation on 07/01/2016 at 7:56 am to [place], the nurse caring for the patient, who verbally acknowledged these results. Electronically Signed   By: Evangeline Dakin M.D.   On: 07/01/2016 08:00   Dg Chest Port 1 View  Result Date: 06/30/2016 CLINICAL DATA:  Status post chest tube removal EXAM: PORTABLE CHEST 1 VIEW COMPARISON:  Study obtained earlier in the day FINDINGS: Chest tube removal of side without evident pneumothorax. There is slight scarring in the bases. There is no edema or consolidation. The heart size and pulmonary vascularity are normal. No adenopathy. No bone lesions. IMPRESSION: No evident pneumothorax. Slight bibasilar scarring. No edema or consolidation. Electronically Signed   By: Lowella Grip III M.D.   On: 06/30/2016 09:50    Assessment/Plan 1. Recurrent left pneumothorax We will plan to replaced his chest tube today.  He had a couple bites of breakfast so it will be early this afternoon before we can place  this.  He is NPO and his labs have been reviewed.   Risks and Benefits discussed with the patient including bleeding, infection, damage to adjacent structures. All of the patient's questions were answered, patient is agreeable to proceed. Consent signed and in chart.    Thank you for this interesting consult.  I greatly enjoyed meeting Mario [name] and look forward to participating in their care.  A copy of this report was sent to the requesting provider on this date.  Electronically Signed: Henreitta Cea 07/01/2016, 9:49 AM   I spent a total of 40 Minutes    in face to face in clinical consultation, greater than 50% of which was counseling/coordinating care for recurrent left pneumothorax

## 2016-07-01 NOTE — Sedation Documentation (Addendum)
Patient resting in bed. Dr Kathlene Cote said his pleura is sensitive. Having pain in chest tube area but falls asleep.

## 2016-07-01 NOTE — Sedation Documentation (Addendum)
Patient is resting comfortably. Still has shoulder pain as prior to procedure 8/10

## 2016-07-01 NOTE — Progress Notes (Addendum)
      [address]       [place] [address]             [phone]           Subjective: Patient states it was harder to take a deep breath this morning. He is receiving a breathing treatment.  Objective: Vital signs in last 24 hours: Temp:  [97.4 F (36.3 C)-98.3 F (36.8 C)] 97.4 F (36.3 C) (05/04 0602) Pulse Rate:  [80-99] 99 (05/04 0602) Cardiac Rhythm: Normal sinus rhythm (05/04 0700) Resp:  [18-158] 18 (05/04 0602) BP: (116-146)/(74-94) 146/94 (05/04 0602) SpO2:  [91 %-99 %] 99 % (05/04 0602)     Intake/Output from previous day: 05/03 0701 - 05/04 0700 In: 960 [P.O.:960] Out: 1675 [Urine:1675]   Physical Exam:  Cardiovascular: RRR Pulmonary: Mostly clear to auscultation bilaterally Chest Tube: to water seal and no air leak  Lab Results: CBC: No results for input(s): WBC, HGB, HCT, PLT in the last 72 hours. BMET:   Recent Labs  06/29/16 0526  NA 136  K 4.7  CL 102  CO2 26  GLUCOSE 95  BUN 20  CREATININE 1.11  CALCIUM 8.6*    PT/INR:  No results for input(s): LABPROT, INR in the last 72 hours. ABG:  INR: Will add last result for INR, ABG once components are confirmed Will add last 4 CBG results once components are confirmed  Assessment/Plan:  1. CV - SR in the 70's. On Lisinopril 10 mg daily. 2.  Pulmonary - On 3 liters of oxygen via [place]. CXR this am shows a pneumothorax and bibasilar atelectasis. As discussed with Dr. Servando Snare, will ask IR to place a pigtail to help re expand lung. Continue Xopenex tid. Check CXR in am. 3. OSA- using CPAP mask from home.[name]  Rubel Heckard MPA-C 07/01/2016,7:29 AM

## 2016-07-02 ENCOUNTER — Inpatient Hospital Stay (HOSPITAL_COMMUNITY): Payer: Medicare Other

## 2016-07-02 DIAGNOSIS — J9311 Primary spontaneous pneumothorax: Secondary | ICD-10-CM

## 2016-07-02 MED ORDER — POLYETHYLENE GLYCOL 3350 17 G PO PACK
17.0000 g | PACK | Freq: Every day | ORAL | Status: DC
  Administered 2016-07-02 – 2016-07-07 (×6): 17 g via ORAL
  Filled 2016-07-02 (×6): qty 1

## 2016-07-02 NOTE — Progress Notes (Addendum)
      [address]       [place] [address]             [phone]        Subjective: Feels pain when he takes a deep breath at the chest tube site.   Objective: Vital signs in last 24 hours: Temp:  [97.6 F (36.4 C)-98.3 F (36.8 C)] 97.6 F (36.4 C) (05/05 0512) Pulse Rate:  [70-89] 71 (05/05 0512) Cardiac Rhythm: Normal sinus rhythm (05/04 1900) Resp:  [13-19] 18 (05/05 0512) BP: (105-140)/(37-82) 125/65 (05/05 0512) SpO2:  [90 %-98 %] 95 % (05/05 0512)     Intake/Output from previous day: 05/04 0701 - 05/05 0700 In: 990 [P.O.:960] Out: 1050 [Urine:1050] Intake/Output this shift: No intake/output data recorded.  General appearance: alert, cooperative and no distress Heart: regular rate and rhythm, S1, S2 normal, no murmur, click, rub or gallop Lungs: clear to auscultation bilaterally Abdomen: soft, non-tender; bowel sounds normal; no masses,  no organomegaly Extremities: extremities normal, atraumatic, no cyanosis or edema Wound: clean and dry  Lab Results: No results for input(s): WBC, HGB, HCT, PLT in the last 72 hours. BMET: No results for input(s): NA, K, CL, CO2, GLUCOSE, BUN, CREATININE, CALCIUM in the last 72 hours.  PT/INR: No results for input(s): LABPROT, INR in the last 72 hours. ABG No results found for: PHART, HCO3, TCO2, ACIDBASEDEF, O2SAT CBG (last 3)  No results for input(s): GLUCAP in the last 72 hours.  Assessment/Plan: S/P IR placement of a left pigtail catheter   1. CV - SR in the 70's. On Lisinopril 10 mg daily with good blood pressure control 2.  Pulmonary - On 4 liters of oxygen via [place]. It appears on today's CXR that the left lung has completely expanded. Will await official read from radiology. Continue Xopenex tid. 3. OSA- using CPAP mask from home. 4. Pain poorly controlled per patient. He is on Oxycodone '10mg'$  q 3 hours. Perhaps he would benefit from gabapentin since there is not really room to increase his  narcotics.    LOS: 5 days    Mario [name] 07/02/2016  Consider talc slurry per tube before removal, check chest xray in am I have seen and examined Mario [name] and agree with the above assessment  and plan.  Grace Isaac MD [place] [phone] Office [phone] 07/02/2016 11:40 AM

## 2016-07-02 NOTE — Plan of Care (Signed)
Problem: Bowel/Gastric: Goal: Will not experience complications related to bowel motility Outcome: Not Progressing No BM since 4/30; Miralax started today

## 2016-07-02 NOTE — Progress Notes (Signed)
Referring Physician(s): Dr. Ceasar Mons  Supervising Physician: Aletta Edouard  Patient Status:  [facility] - In-pt  Chief Complaint: Follow up (L)chest tube  Subjective: Pt s/p placement of (L)pigtail chest tube for PTX Sitting up in chair. Some soreness at tube site. No SOB  Allergies: Demerol [meperidine] and Zocor [simvastatin]  Medications:  Current Facility-Administered Medications:  .  0.9 %  sodium chloride infusion, 250 mL, Intravenous, PRN, Lars Pinks M, PA-C .  acetaminophen (TYLENOL) tablet 650 mg, 650 mg, Oral, Q6H PRN, 650 mg at 07/02/16 0205 **OR** acetaminophen (TYLENOL) suppository 650 mg, 650 mg, Rectal, Q6H PRN, Tacy Dura, Donielle M, PA-C .  amitriptyline (ELAVIL) tablet 20 mg, 20 mg, Oral, QHS, Zimmerman, Donielle M, PA-C, 20 mg at 07/01/16 2148 .  aspirin EC tablet 81 mg, 81 mg, Oral, Daily, Lars Pinks M, PA-C, 81 mg at 07/02/16 4782 .  buPROPion [facility] SR) 12 hr tablet 150 mg, 150 mg, Oral, BID, Lars Pinks M, PA-C, 150 mg at 07/02/16 9562 .  calcium carbonate (TUMS - dosed in mg elemental calcium) chewable tablet 400 mg of elemental calcium, 400 mg of elemental calcium, Oral, TID PRN, Grace Isaac, MD, 400 mg of elemental calcium at 07/01/16 1710 .  docusate sodium (COLACE) capsule 100 mg, 100 mg, Oral, Daily, Lars Pinks M, PA-C, 100 mg at 07/02/16 1308 .  DULoxetine (CYMBALTA) DR capsule 30 mg, 30 mg, Oral, Daily, Lars Pinks M, PA-C, 30 mg at 07/02/16 6578 .  enoxaparin (LOVENOX) injection 40 mg, 40 mg, Subcutaneous, Q24H, Lars Pinks M, PA-C, 40 mg at 07/01/16 2148 .  levalbuterol (XOPENEX) nebulizer solution 0.63 mg, 0.63 mg, Nebulization, TID, Grace Isaac, MD, 0.63 mg at 07/02/16 0825 .  lisinopril (PRINIVIL,ZESTRIL) tablet 10 mg, 10 mg, Oral, Daily, Lars Pinks M, PA-C, 10 mg at 07/02/16 4696 .  MEDLINE mouth rinse, 15 mL, Mouth Rinse, BID, Grace Isaac, MD, 15 mL at  07/02/16 1000 .  nitroGLYCERIN (NITROSTAT) SL tablet 0.4 mg, 0.4 mg, Sublingual, Q5 min PRN, Cardama, Grayce Sessions, MD .  ondansetron (ZOFRAN) tablet 4 mg, 4 mg, Oral, Q6H PRN **OR** ondansetron (ZOFRAN) injection 4 mg, 4 mg, Intravenous, Q6H PRN, Tacy Dura, Donielle M, PA-C .  oxyCODONE (Oxy IR/ROXICODONE) immediate release tablet 10 mg, 10 mg, Oral, Q3H PRN, Grace Isaac, MD, 10 mg at 07/02/16 2952 .  pantoprazole (PROTONIX) EC tablet 40 mg, 40 mg, Oral, Daily, Lars Pinks M, PA-C, 40 mg at 07/02/16 8413 .  prednisoLONE acetate (PRED FORTE) 1 % ophthalmic suspension 2 drop, 2 drop, Right Eye, QID, Grace Isaac, MD, 2 drop at 07/02/16 1000 .  sodium chloride flush (NS) 0.9 % injection 3 mL, 3 mL, Intravenous, Q12H, Zimmerman, Donielle M, PA-C, 3 mL at 07/02/16 1000 .  sodium chloride flush (NS) 0.9 % injection 3 mL, 3 mL, Intravenous, PRN, Lars Pinks M, PA-C .  tiotropium [facility]) inhalation capsule 18 mcg, 18 mcg, Inhalation, Daily, Lars Pinks M, PA-C, 18 mcg at 07/01/16 2440 .  traMADol (ULTRAM) tablet 50 mg, 50 mg, Oral, Q6H PRN, Lars Pinks M, PA-C, 50 mg at 06/27/16 2023    Vital Signs: BP 125/65 (BP Location: Left Arm)   Pulse 71   Temp 97.6 F (36.4 C) (Oral)   Resp 18   Ht '6\' 2"'$  (1.88 m)   Wt (!) 323 lb 6.4 oz (146.7 kg)   SpO2 94%   BMI 41.52 kg/m   Physical Exam (L)chest tube intact, site clean, mildly tender No  air leak appreciated, scant serosanguinous fluid output CXR shows no PTX  Imaging: Dg Chest 1 View  Result Date: 06/30/2016 CLINICAL DATA:  Left pneumothorax with chest tube treatment EXAM: CHEST 1 VIEW COMPARISON:  Portable chest x-ray of Jun 29, 2016 and Jul 21, 2014. FINDINGS: The image is obtained in a lordotic manner. The lungs are mildly hypoinflated. Bibasilar densities persist but are stable. There is no pleural effusion or new visible pneumothorax today. The small caliber chest tube projects over the lateral  aspect of the mid upper hemithorax overlying the lateral aspect of the fourth rib. The heart and pulmonary vascularity are normal. IMPRESSION: Bibasilar atelectasis, stable.  No residual left-sided pneumothorax. Electronically Signed   By: David  Martinique M.D.   On: 06/30/2016 07:32   Dg Chest 2 View  Result Date: 07/01/2016 CLINICAL DATA:  66 year old on CPAP at night for the past 3 years, presenting with acute onset of shortness of breath after removing his CPAP. Patient had a left chest tube removed on 06/29/2016 for a pneumothorax. EXAM: CHEST  2 VIEW COMPARISON:  06/30/2016, 06/29/2016 and earlier. FINDINGS: Recurrent left pneumothorax on the order of 20-30% or so. The left lung at the base is tethered to the pleural by scar. The pneumothorax is predominantly lateral in location. Associated passive atelectasis in the left lower lobe, increased since yesterday. Lungs otherwise clear. Emphysematous changes in the upper lobes, prominent bronchovascular markings diffusely and mild central peribronchial thickening are unchanged. Cardiac silhouette normal in size, unchanged. Thoracic aorta atherosclerotic, unchanged. Hilar and mediastinal contours otherwise unremarkable. Degenerative changes and DISH involving the thoracic spine. IMPRESSION: 1. Recurrent left pneumothorax on the order of 20 through 30% or so, predominantly lateral in location. The left lung at the base is tethered to the pleural by scar. 2. Associated mild passive atelectasis in the left lower lobe, increased since yesterday. 3. Stable baseline COPD/emphysema. I telephoned this critical value/emergent result at the time of interpretation on 07/01/2016 at 7:56 am to [place], the nurse caring for the patient, who verbally acknowledged these results. Electronically Signed   By: Evangeline Dakin M.D.   On: 07/01/2016 08:00   Dg Chest Port 1 View  Result Date: 07/02/2016 CLINICAL DATA:  Follow-up left pneumothorax EXAM: PORTABLE CHEST 1 VIEW COMPARISON:   Chest radiograph from one day prior. FINDINGS: Left basilar pigtail chest tube is in place. Stable cardiomediastinal silhouette with normal heart size. No appreciable residual pneumothorax. No pleural effusion. Patchy bibasilar scarring versus atelectasis. No pulmonary edema. IMPRESSION: 1. No appreciable residual left pneumothorax status post left basilar chest tube placement. 2. Patchy bibasilar scarring versus atelectasis. Electronically Signed   By: Ilona Sorrel M.D.   On: 07/02/2016 09:10   Dg Chest Port 1 View  Result Date: 06/30/2016 CLINICAL DATA:  Status post chest tube removal EXAM: PORTABLE CHEST 1 VIEW COMPARISON:  Study obtained earlier in the day FINDINGS: Chest tube removal of side without evident pneumothorax. There is slight scarring in the bases. There is no edema or consolidation. The heart size and pulmonary vascularity are normal. No adenopathy. No bone lesions. IMPRESSION: No evident pneumothorax. Slight bibasilar scarring. No edema or consolidation. Electronically Signed   By: Lowella Grip III M.D.   On: 06/30/2016 09:50   Dg Chest Port 1 View  Result Date: 06/29/2016 CLINICAL DATA:  Chest tube treatment for left-sided pneumothorax. EXAM: PORTABLE CHEST 1 VIEW COMPARISON:  Portable chest x-ray of Jun 28, 2016 FINDINGS: The lungs are adequately inflated. No residual left-sided pneumothorax is  observed. The small caliber chest tube projects over the lateral aspects of the left fourth and fifth ribs and is stable. There is patchy density at both bases compatible with subsegmental atelectasis. There is no pleural effusion. The heart and pulmonary vascularity are normal. There is calcification in the wall of the aortic arch. IMPRESSION: No residual pneumothorax is observed. There is persistent bibasilar atelectasis. Electronically Signed   By: David  Martinique M.D.   On: 06/29/2016 07:48   Ct Perc Pleural Drain W/indwell Cath W/img Guide  Result Date: 07/01/2016 INDICATION: Recurrent  spontaneous left pneumothorax with request for image guided chest tube placement. EXAM: CT-GUIDED LEFT THORACOSTOMY TUBE PLACEMENT MEDICATIONS: See below. ANESTHESIA/SEDATION: Fentanyl 75 mcg IV; Versed 2.0 mg IV Moderate Sedation Time:  25 minutes. The patient was continuously monitored during the procedure by the interventional radiology nurse under my direct supervision. COMPLICATIONS: None immediate. PROCEDURE: Informed written consent was obtained from the patient after a thorough discussion of the procedural risks, benefits and alternatives. All questions were addressed. Maximal Sterile Barrier Technique was utilized including caps, mask, sterile gowns, sterile gloves, sterile drape, hand hygiene and skin antiseptic. A timeout was performed prior to the initiation of the procedure. Imaging was performed in a supine position. A site was marked along the left anterior chest wall. Under CT guidance, an 18 gauge trocar needle was advanced into the anterior pleural space. A guidewire was advanced. The tract was dilated and a 12 French pigtail catheter advanced into the pleural space. CT imaging was performed to confirm to position. The tube was secured at the skin with a Prolene retention suture, StatLock device and petroleum gauze dressing. The tube was connected to a [place] Pleur-Evac device which will be connected to wall suction at -20 cm of water. FINDINGS: Moderate-sized left-sided pneumothorax is present. The pigtail catheter was advanced into the anterior aspect of the pneumothorax at the level of the nipple line. IMPRESSION: CT-guided placement of left-sided pigtail chest tube within the pleural space to treat a recurrent pneumothorax. Electronically Signed   By: Aletta Edouard M.D.   On: 07/01/2016 16:30    Labs:  CBC:  Recent Labs  06/27/16 1121 06/27/16 1711  WBC 12.8* 15.8*  HGB 17.6* 17.4*  HCT 51.7 51.4  PLT 246 248    COAGS:  Recent Labs  06/28/16 0208  INR 0.98     BMP:  Recent Labs  06/27/16 1121 06/27/16 1711 06/29/16 0526  NA 137  --  136  K 4.5  --  4.7  CL 105  --  102  CO2 24  --  26  GLUCOSE 150*  --  95  BUN 13  --  20  CALCIUM 8.5*  --  8.6*  CREATININE 1.13 1.34* 1.11  GFRNONAA >60 54* >60  GFRAA >60 >60 >60    LIVER FUNCTION TESTS:  Recent Labs  06/27/16 1121  BILITOT 0.6  AST 50*  ALT 75*  ALKPHOS 76  PROT 6.9  ALBUMIN 3.9    Assessment and Plan: Spont (L)PTX s/p perc chest tube Keep to suction. Probable water seal tomorrow. IR following along.  Electronically Signed: Ascencion Dike 07/02/2016, 10:23 AM   I spent a total of 15 Minutes at the the patient's bedside AND on the patient's hospital floor or unit, greater than 50% of which was counseling/coordinating care for (L)chest tube for PTX

## 2016-07-03 ENCOUNTER — Inpatient Hospital Stay (HOSPITAL_COMMUNITY): Payer: Medicare Other

## 2016-07-03 MED ORDER — GABAPENTIN 300 MG PO CAPS
600.0000 mg | ORAL_CAPSULE | Freq: Two times a day (BID) | ORAL | Status: DC
  Administered 2016-07-03 – 2016-07-07 (×9): 600 mg via ORAL
  Filled 2016-07-03 (×9): qty 2

## 2016-07-03 MED ORDER — SENNA 8.6 MG PO TABS
1.0000 | ORAL_TABLET | Freq: Every day | ORAL | Status: DC
  Administered 2016-07-03 – 2016-07-07 (×5): 8.6 mg via ORAL
  Filled 2016-07-03 (×5): qty 1

## 2016-07-03 MED ORDER — MIRTAZAPINE 15 MG PO TBDP
15.0000 mg | ORAL_TABLET | Freq: Every day | ORAL | Status: DC
Start: 2016-07-03 — End: 2016-07-07
  Administered 2016-07-03 – 2016-07-06 (×4): 15 mg via ORAL
  Filled 2016-07-03 (×4): qty 1

## 2016-07-03 NOTE — Progress Notes (Addendum)
      [address]       [place] [address]             [phone]        Subjective: Feels okay this morning. Shares that he is not on some of his home medications.   Objective: Vital signs in last 24 hours: Temp:  [97.9 F (36.6 C)-98.1 F (36.7 C)] 97.9 F (36.6 C) (05/06 0814) Pulse Rate:  [80-81] 80 (05/06 4818) Cardiac Rhythm: Sinus tachycardia;Other (Comment) (05/06 0531) Resp:  [18] 18 (05/06 5631) BP: (124-126)/(69-75) 124/75 (05/06 4970) SpO2:  [93 %-95 %] 93 % (05/06 0608)    Intake/Output from previous day: 05/05 0701 - 05/06 0700 In: -  Out: 30 [Chest Tube:30] Intake/Output this shift: No intake/output data recorded.  General appearance: alert, cooperative and no distress Heart: regular rate and rhythm, S1, S2 normal, no murmur, click, rub or gallop Lungs: clear to auscultation bilaterally Abdomen: soft, non-tender; bowel sounds normal; no masses,  no organomegaly Extremities: extremities normal, atraumatic, no cyanosis or edema Wound: clean and dry  Lab Results: No results for input(s): WBC, HGB, HCT, PLT in the last 72 hours. BMET: No results for input(s): NA, K, CL, CO2, GLUCOSE, BUN, CREATININE, CALCIUM in the last 72 hours.  PT/INR: No results for input(s): LABPROT, INR in the last 72 hours. ABG No results found for: PHART, HCO3, TCO2, ACIDBASEDEF, O2SAT CBG (last 3)  No results for input(s): GLUCAP in the last 72 hours.  Assessment/Plan:  S/P IR placement of a left pigtail catheter   1. CV - SR in the 70's. On Lisinopril 10 mg daily with good blood pressure control 2. Pulmonary - Remains on 4liters of oxygen via [place]. Await CXR from this morning. Continue Xopenex tid. 3. OSA- using CPAP mask from home. 4. Pain poorly controlled per patient. He is on Oxycodone '10mg'$  q 3 hours.  5. The patient states he was on gabapentin at home '600mg'$  BID, Remeron '15mg'$  at night, and senna. I will make these changes. Also, he shares that he  stopped taking Amitiptyline, so I will remove. It appears some of the home medications have not been reviewed on the mar.   Plan: Await chest xray then will discuss talc slurry with Dr. Servando Snare.     LOS: 6 days    Elgie Collard 07/03/2016  Chest xray reviewed  Dg Chest Port 1 View  Result Date: 07/03/2016 CLINICAL DATA:  66 year old male with spontaneous left pneumothorax status post CT-guided chest tube placement. EXAM: PORTABLE CHEST 1 VIEW COMPARISON:  07/02/2016 and earlier. FINDINGS: Portable AP semi upright view at 0718 hours. Pigtail type left chest tube remains in place. No pneumothorax identified. Patchy bibasilar scarring or atelectasis is stable. No pulmonary edema. No pleural effusion identified. Stable cardiac size and mediastinal contours. IMPRESSION: 1. Stable left chest tube with no pneumothorax. 2. Stable ventilation with bibasilar atelectasis or scarring. Electronically Signed   By: Genevie Ann M.D.   On: 07/03/2016 08:42    Ct to water seal, consider talc slurry tomorrow  I have seen and examined Corliss Marcus and agree with the above assessment  and plan.  Grace Isaac MD Bee[place] [phone] Office [phone] 07/03/2016 10:49 AM

## 2016-07-03 NOTE — Progress Notes (Signed)
Referring Physician(s): Dr. Ceasar Mons  Supervising Physician: Aletta Edouard  Patient Status:  [facility] - In-pt  Chief Complaint: Follow up (L)chest tube  Subjective: Pt s/p placement of (L)pigtail chest tube for PTX Out for a walk in the halls. Mild soreness at tube site. No SOB  Allergies: Demerol [meperidine] and Zocor [simvastatin]  Medications:  Current Facility-Administered Medications:  .  0.9 %  sodium chloride infusion, 250 mL, Intravenous, PRN, Lars Pinks M, PA-C .  acetaminophen (TYLENOL) tablet 650 mg, 650 mg, Oral, Q6H PRN, 650 mg at 07/02/16 0205 **OR** acetaminophen (TYLENOL) suppository 650 mg, 650 mg, Rectal, Q6H PRN, Tacy Dura, Donielle M, PA-C .  aspirin EC tablet 81 mg, 81 mg, Oral, Daily, Lars Pinks M, PA-C, 81 mg at 07/03/16 0818 .  buPROPion [facility] SR) 12 hr tablet 150 mg, 150 mg, Oral, BID, Lars Pinks M, PA-C, 150 mg at 07/03/16 0817 .  calcium carbonate (TUMS - dosed in mg elemental calcium) chewable tablet 400 mg of elemental calcium, 400 mg of elemental calcium, Oral, TID PRN, Grace Isaac, MD, 400 mg of elemental calcium at 07/01/16 1710 .  docusate sodium (COLACE) capsule 100 mg, 100 mg, Oral, Daily, Lars Pinks M, PA-C, 100 mg at 07/03/16 0818 .  DULoxetine (CYMBALTA) DR capsule 30 mg, 30 mg, Oral, Daily, Lars Pinks M, PA-C, 30 mg at 07/03/16 0818 .  enoxaparin (LOVENOX) injection 40 mg, 40 mg, Subcutaneous, Q24H, Lars Pinks M, PA-C, 40 mg at 07/02/16 2141 .  gabapentin (NEURONTIN) capsule 600 mg, 600 mg, Oral, BID, Conte, Tessa N, PA-C, 600 mg at 07/03/16 0818 .  levalbuterol (XOPENEX) nebulizer solution 0.63 mg, 0.63 mg, Nebulization, TID, Grace Isaac, MD, 0.63 mg at 07/03/16 0856 .  lisinopril (PRINIVIL,ZESTRIL) tablet 10 mg, 10 mg, Oral, Daily, Lars Pinks M, PA-C, 10 mg at 07/03/16 0817 .  MEDLINE mouth rinse, 15 mL, Mouth Rinse, BID, Grace Isaac, MD, 15 mL at  07/03/16 0817 .  mirtazapine (REMERON SOL-TAB) disintegrating tablet 15 mg, 15 mg, Oral, QHS, Conte, Tessa N, PA-C .  nitroGLYCERIN (NITROSTAT) SL tablet 0.4 mg, 0.4 mg, Sublingual, Q5 min PRN, Cardama, Grayce Sessions, MD .  ondansetron (ZOFRAN) tablet 4 mg, 4 mg, Oral, Q6H PRN **OR** ondansetron (ZOFRAN) injection 4 mg, 4 mg, Intravenous, Q6H PRN, Tacy Dura, Donielle M, PA-C .  oxyCODONE (Oxy IR/ROXICODONE) immediate release tablet 10 mg, 10 mg, Oral, Q3H PRN, Grace Isaac, MD, 10 mg at 07/03/16 0818 .  pantoprazole (PROTONIX) EC tablet 40 mg, 40 mg, Oral, Daily, Lars Pinks M, PA-C, 40 mg at 07/03/16 0817 .  polyethylene glycol (MIRALAX / GLYCOLAX) packet 17 g, 17 g, Oral, Daily, Harriet Pho, Tessa N, PA-C, 17 g at 07/03/16 0816 .  prednisoLONE acetate (PRED FORTE) 1 % ophthalmic suspension 2 drop, 2 drop, Right Eye, QID, Grace Isaac, MD, 2 drop at 07/03/16 [phone] .  senna (SENOKOT) tablet 8.6 mg, 1 tablet, Oral, Daily, Harriet Pho, Tessa N, PA-C, 8.6 mg at 07/03/16 0817 .  sodium chloride flush (NS) 0.9 % injection 3 mL, 3 mL, Intravenous, Q12H, Zimmerman, Donielle M, PA-C, 3 mL at 07/03/16 1000 .  sodium chloride flush (NS) 0.9 % injection 3 mL, 3 mL, Intravenous, PRN, Lars Pinks M, PA-C .  tiotropium [facility]) inhalation capsule 18 mcg, 18 mcg, Inhalation, Daily, Lars Pinks M, PA-C, 18 mcg at 07/03/16 0856 .  traMADol (ULTRAM) tablet 50 mg, 50 mg, Oral, Q6H PRN, Nani Skillern, PA-C, 50 mg at 06/27/16 2023  Vital Signs: BP 124/75 (BP Location: Right Arm)   Pulse 80   Temp 97.9 F (36.6 C) (Oral)   Resp 18   Ht '6\' 2"'$  (1.88 m)   Wt (!) 323 lb 6.4 oz (146.7 kg)   SpO2 95%   BMI 41.52 kg/m   Physical Exam (L)chest tube intact, site clean, mildly tender No air leak appreciated, scant serosanguinous fluid output CXR shows no PTX  Imaging: Dg Chest 1 View  Result Date: 06/30/2016 CLINICAL DATA:  Left pneumothorax with chest tube treatment EXAM: CHEST  1 VIEW COMPARISON:  Portable chest x-ray of Jun 29, 2016 and Jul 21, 2014. FINDINGS: The image is obtained in a lordotic manner. The lungs are mildly hypoinflated. Bibasilar densities persist but are stable. There is no pleural effusion or new visible pneumothorax today. The small caliber chest tube projects over the lateral aspect of the mid upper hemithorax overlying the lateral aspect of the fourth rib. The heart and pulmonary vascularity are normal. IMPRESSION: Bibasilar atelectasis, stable.  No residual left-sided pneumothorax. Electronically Signed   By: David  Martinique M.D.   On: 06/30/2016 07:32   Dg Chest 2 View  Result Date: 07/01/2016 CLINICAL DATA:  66 year old on CPAP at night for the past 3 years, presenting with acute onset of shortness of breath after removing his CPAP. Patient had a left chest tube removed on 06/29/2016 for a pneumothorax. EXAM: CHEST  2 VIEW COMPARISON:  06/30/2016, 06/29/2016 and earlier. FINDINGS: Recurrent left pneumothorax on the order of 20-30% or so. The left lung at the base is tethered to the pleural by scar. The pneumothorax is predominantly lateral in location. Associated passive atelectasis in the left lower lobe, increased since yesterday. Lungs otherwise clear. Emphysematous changes in the upper lobes, prominent bronchovascular markings diffusely and mild central peribronchial thickening are unchanged. Cardiac silhouette normal in size, unchanged. Thoracic aorta atherosclerotic, unchanged. Hilar and mediastinal contours otherwise unremarkable. Degenerative changes and DISH involving the thoracic spine. IMPRESSION: 1. Recurrent left pneumothorax on the order of 20 through 30% or so, predominantly lateral in location. The left lung at the base is tethered to the pleural by scar. 2. Associated mild passive atelectasis in the left lower lobe, increased since yesterday. 3. Stable baseline COPD/emphysema. I telephoned this critical value/emergent result at the time of  interpretation on 07/01/2016 at 7:56 am to [place], the nurse caring for the patient, who verbally acknowledged these results. Electronically Signed   By: Evangeline Dakin M.D.   On: 07/01/2016 08:00   Dg Chest Port 1 View  Result Date: 07/03/2016 CLINICAL DATA:  66 year old male with spontaneous left pneumothorax status post CT-guided chest tube placement. EXAM: PORTABLE CHEST 1 VIEW COMPARISON:  07/02/2016 and earlier. FINDINGS: Portable AP semi upright view at 0718 hours. Pigtail type left chest tube remains in place. No pneumothorax identified. Patchy bibasilar scarring or atelectasis is stable. No pulmonary edema. No pleural effusion identified. Stable cardiac size and mediastinal contours. IMPRESSION: 1. Stable left chest tube with no pneumothorax. 2. Stable ventilation with bibasilar atelectasis or scarring. Electronically Signed   By: Genevie Ann M.D.   On: 07/03/2016 08:42   Dg Chest Port 1 View  Result Date: 07/02/2016 CLINICAL DATA:  Follow-up left pneumothorax EXAM: PORTABLE CHEST 1 VIEW COMPARISON:  Chest radiograph from one day prior. FINDINGS: Left basilar pigtail chest tube is in place. Stable cardiomediastinal silhouette with normal heart size. No appreciable residual pneumothorax. No pleural effusion. Patchy bibasilar scarring versus atelectasis. No pulmonary edema. IMPRESSION: 1. No  appreciable residual left pneumothorax status post left basilar chest tube placement. 2. Patchy bibasilar scarring versus atelectasis. Electronically Signed   By: Ilona Sorrel M.D.   On: 07/02/2016 09:10   Dg Chest Port 1 View  Result Date: 06/30/2016 CLINICAL DATA:  Status post chest tube removal EXAM: PORTABLE CHEST 1 VIEW COMPARISON:  Study obtained earlier in the day FINDINGS: Chest tube removal of side without evident pneumothorax. There is slight scarring in the bases. There is no edema or consolidation. The heart size and pulmonary vascularity are normal. No adenopathy. No bone lesions. IMPRESSION: No evident  pneumothorax. Slight bibasilar scarring. No edema or consolidation. Electronically Signed   By: Lowella Grip III M.D.   On: 06/30/2016 09:50   Ct Perc Pleural Drain W/indwell Cath W/img Guide  Result Date: 07/01/2016 INDICATION: Recurrent spontaneous left pneumothorax with request for image guided chest tube placement. EXAM: CT-GUIDED LEFT THORACOSTOMY TUBE PLACEMENT MEDICATIONS: See below. ANESTHESIA/SEDATION: Fentanyl 75 mcg IV; Versed 2.0 mg IV Moderate Sedation Time:  25 minutes. The patient was continuously monitored during the procedure by the interventional radiology nurse under my direct supervision. COMPLICATIONS: None immediate. PROCEDURE: Informed written consent was obtained from the patient after a thorough discussion of the procedural risks, benefits and alternatives. All questions were addressed. Maximal Sterile Barrier Technique was utilized including caps, mask, sterile gowns, sterile gloves, sterile drape, hand hygiene and skin antiseptic. A timeout was performed prior to the initiation of the procedure. Imaging was performed in a supine position. A site was marked along the left anterior chest wall. Under CT guidance, an 18 gauge trocar needle was advanced into the anterior pleural space. A guidewire was advanced. The tract was dilated and a 12 French pigtail catheter advanced into the pleural space. CT imaging was performed to confirm to position. The tube was secured at the skin with a Prolene retention suture, StatLock device and petroleum gauze dressing. The tube was connected to a [place] Pleur-Evac device which will be connected to wall suction at -20 cm of water. FINDINGS: Moderate-sized left-sided pneumothorax is present. The pigtail catheter was advanced into the anterior aspect of the pneumothorax at the level of the nipple line. IMPRESSION: CT-guided placement of left-sided pigtail chest tube within the pleural space to treat a recurrent pneumothorax. Electronically Signed   By:  Aletta Edouard M.D.   On: 07/01/2016 16:30    Labs:  CBC:  Recent Labs  06/27/16 1121 06/27/16 1711  WBC 12.8* 15.8*  HGB 17.6* 17.4*  HCT 51.7 51.4  PLT 246 248    COAGS:  Recent Labs  06/28/16 0208  INR 0.98    BMP:  Recent Labs  06/27/16 1121 06/27/16 1711 06/29/16 0526  NA 137  --  136  K 4.5  --  4.7  CL 105  --  102  CO2 24  --  26  GLUCOSE 150*  --  95  BUN 13  --  20  CALCIUM 8.5*  --  8.6*  CREATININE 1.13 1.34* 1.11  GFRNONAA >60 54* >60  GFRAA >60 >60 >60    LIVER FUNCTION TESTS:  Recent Labs  06/27/16 1121  BILITOT 0.6  AST 50*  ALT 75*  ALKPHOS 76  PROT 6.9  ALBUMIN 3.9    Assessment and Plan: Spont (L)PTX s/p perc chest tube CTS plans for talc/pleurodesis noted IR following along.  Electronically Signed: Ascencion Dike 07/03/2016, 10:31 AM   I spent a total of 15 Minutes at the the patient's bedside AND  on the patient's hospital floor or unit, greater than 50% of which was counseling/coordinating care for (L)chest tube for PTX

## 2016-07-04 ENCOUNTER — Other Ambulatory Visit: Payer: Self-pay | Admitting: Cardiothoracic Surgery

## 2016-07-04 ENCOUNTER — Inpatient Hospital Stay (HOSPITAL_COMMUNITY): Payer: Medicare Other

## 2016-07-04 DIAGNOSIS — J9311 Primary spontaneous pneumothorax: Secondary | ICD-10-CM

## 2016-07-04 DIAGNOSIS — J9383 Other pneumothorax: Secondary | ICD-10-CM

## 2016-07-04 LAB — CREATININE, SERUM
Creatinine, Ser: 1.13 mg/dL (ref 0.61–1.24)
GFR calc Af Amer: >60 mL/min (ref >60)
GFR calc non Af Amer: >60 mL/min (ref >60)

## 2016-07-04 MED ORDER — MORPHINE SULFATE (PF) 4 MG/ML IV SOLN
2.0000 mg | INTRAVENOUS | Status: AC | PRN
  Administered 2016-07-04 (×2): 2 mg via INTRAVENOUS
  Filled 2016-07-04 (×2): qty 1

## 2016-07-04 MED ORDER — TALC (STERITALC) POWDER FOR INTRAPLEURAL USE
4.0000 g | Freq: Once | INTRAVENOUS | Status: AC
  Administered 2016-07-04: 2 g via INTRAPLEURAL
  Filled 2016-07-04 (×3): qty 4

## 2016-07-04 NOTE — Progress Notes (Addendum)
      [address]       [place] [address]             [phone]           Subjective: Patient states he was able to sleep in bed last night without much difficulty.  Objective: Vital signs in last 24 hours: Temp:  [97.5 F (36.4 C)-97.8 F (36.6 C)] 97.5 F (36.4 C) (05/06 1925) Pulse Rate:  [80-86] 86 (05/07 0434) Cardiac Rhythm: Normal sinus rhythm (05/07 0700) Resp:  [18] 18 (05/07 0434) BP: (125-150)/(63-82) 150/82 (05/07 0434) SpO2:  [93 %-97 %] 93 % (05/07 0434)     Intake/Output from previous day: 05/06 0701 - 05/07 0700 In: -  Out: 615 [Urine:600; Chest Tube:15]   Physical Exam:  Cardiovascular: RRR Pulmonary: Mostly clear to auscultation bilaterally Chest Tube: to water seal and no air leak  Lab Results: CBC: No results for input(s): WBC, HGB, HCT, PLT in the last 72 hours. BMET:   Recent Labs  07/04/16 0523  CREATININE 1.13    PT/INR:  No results for input(s): LABPROT, INR in the last 72 hours. ABG:  INR: Will add last result for INR, ABG once components are confirmed Will add last 4 CBG results once components are confirmed  Assessment/Plan:  1. CV - SR in the 70's. On Lisinopril 10 mg daily. 2.  Pulmonary - S/p 12 French chest tube by IR 05/04.On 4 liters of oxygen via [place]. CXR NOT ordered this am so will order. May need TALC slurry. Xopenex tid. Check CXR in am.  3. OSA- using CPAP mask from home.[name]  ZIMMERMAN,DONIELLE MPA-C 07/04/2016,7:20 AM    Talc pleurodesis today I have seen and examined Corliss Marcus and agree with the above assessment  and plan.  Grace Isaac MD [place] [phone] Office [phone] 07/04/2016 4:35 PM

## 2016-07-04 NOTE — Care Management Note (Addendum)
Case Management Note  Patient Details  Name: Danel Requena MRN: [id number] Date of Birth: [birth year]  Subjective/Objective:   s/p left spontaneous left pneumothorax                 Action/Plan: Discharge Planning: Spoke to pt at bedside. Lives with a roommate. States his roommate will assist him with transportation to his appt. States [facility] [place]. Has nocturnal oxygen and CPAP through the [place] with [facility]. Pt may need continuous oxygen for home. Will need qualifying sats prior to dc. Will continue to follow for dc needs.   Contacted [place] and left message for [facility], [place] #[phone] ext [phone].   Spoke to [place] call support. Pt sees Dr, Andre Lefort, Fax # [phone].    Expected Discharge Date:                  Expected Discharge Plan:  [place]  In-House Referral:  NA  Discharge planning Services  CM Consult  Post Acute Care Choice:    Choice offered to:     DME Arranged:    DME Agency:     HH Arranged:    HH Agency:     Status of Service:  In process, will continue to follow  If discussed at Long Length of Stay Meetings, dates discussed:    Additional Comments:  Erenest Rasher, RN 07/04/2016, 2:58 PM

## 2016-07-04 NOTE — Progress Notes (Signed)
Referring Physician(s): Dr Festus Barren  Supervising Physician: Sandi Mariscal  Patient Status:  [facility] - In-pt  Chief Complaint:  Left chest tube placed 5/4 Spontaneous PTX  Subjective:  Up in chair Breathing some better VEL:[id number]: No change in left-sided chest tube position without evident pneumothorax. Bibasilar atelectasis. No new opacity. Stable cardiac silhouette. There is aortic atherosclerosis.  For talc slurry per TCTS PA   Allergies: Demerol [meperidine] and Zocor [simvastatin]  Medications: Prior to Admission medications   Medication Sig Start Date End Date Taking? Authorizing Provider  albuterol (PROVENTIL HFA;VENTOLIN HFA) 108 (90 Base) MCG/ACT inhaler Inhale 2 puffs into the lungs 4 (four) times daily.   Yes [provider]  amitriptyline (ELAVIL) 10 MG tablet Take 20 mg by mouth at bedtime.   Yes [provider]  aspirin 81 MG tablet Take 81 mg by mouth daily.   Yes [provider]  buPROPion (WELLBUTRIN SR) 150 MG 12 hr tablet Take 300 mg by mouth every morning.    Yes [provider]  carboxymethylcellulose 1 % ophthalmic solution Place 1 drop into both eyes at bedtime.   Yes [provider]  Cholecalciferol (VITAMIN D) 2000 units CAPS Take 2,000 Units by mouth daily.   Yes [provider]  clotrimazole (LOTRIMIN) 1 % cream Apply 1 application topically 2 (two) times daily.   Yes [provider]  diclofenac (VOLTAREN) 75 MG EC tablet Take 75 mg by mouth 2 (two) times daily.   Yes [provider]  DULoxetine (CYMBALTA) 30 MG capsule Take 1 capsule (30 mg total) by mouth daily. 07/21/14  Yes Evelina Bucy, MD  flunisolide (NASALIDE) 25 MCG/ACT (0.025%) SOLN Place 1 spray into both nostrils daily.    Yes [provider]  folic acid (FOLVITE) 1 MG tablet Take 1 mg by mouth daily.   Yes [provider]  Hypromellose (ARTIFICIAL TEARS OP) Place 1 drop into both eyes daily as  needed (dry eyes).   Yes [provider]  Ipratropium-Albuterol (COMBIVENT RESPIMAT) 20-100 MCG/ACT AERS respimat Inhale 1 puff into the lungs every 6 (six) hours as needed for wheezing.   Yes [provider]  lisinopril (PRINIVIL,ZESTRIL) 20 MG tablet Take 1 tablet (20 mg total) by mouth daily. For high blood pressure 01/21/13  Yes Mashburn, Marlane Hatcher, PA-C  methotrexate (RHEUMATREX) 2.5 MG tablet Take 15 mg by mouth once a week. Caution:Chemotherapy. Protect from light.   Yes [provider]  mirtazapine (REMERON) 15 MG tablet Take 15 mg by mouth at bedtime.   Yes [provider]  nicotine (NICODERM CQ - DOSED IN MG/24 HOURS) 21 mg/24hr patch Place 21 mg onto the skin daily.   Yes [provider]  prednisoLONE acetate (PRED FORTE) 1 % ophthalmic suspension Place 1 drop into the right eye every 2 (two) hours.   Yes [provider]  sennosides-docusate sodium (SENOKOT-S) 8.6-50 MG tablet Take 1-2 tablets by mouth daily as needed for constipation.   Yes [provider]  sodium chloride (OCEAN) 0.65 % SOLN nasal spray Place 2 sprays into both nostrils 4 (four) times daily as needed for congestion.   Yes [provider]  triamcinolone cream (KENALOG) 0.1 % Apply 1 application topically 2 (two) times daily.   Yes [provider]  ARIPiprazole (ABILIFY) 5 MG tablet Take 1 tablet (5 mg total) by mouth daily. For depression and anxiety. Patient not taking: Reported on 06/27/2016 07/21/14   Evelina Bucy, MD  atorvastatin (LIPITOR) 20 MG  tablet Take 1 tablet (20 mg total) by mouth daily. Patient not taking: Reported on 07/21/2014 05/26/14   Cristal Ford, DO  meclizine (ANTIVERT) 25 MG tablet Take 1 tablet (25 mg total) by mouth 3 (three) times daily as needed for dizziness. Patient not taking: Reported on 06/27/2016 01/08/14   Noland Fordyce, PA-C  pantoprazole (PROTONIX) 40 MG tablet Take 1 tablet (40 mg total) by mouth  daily. Patient not taking: Reported on 07/21/2014 05/26/14   Cristal Ford, DO  predniSONE (DELTASONE) 20 MG tablet 3 tabs po day one, then 2 po daily x 4 days Patient not taking: Reported on 06/27/2016 07/21/14   Evelina Bucy, MD  tiotropium (SPIRIVA) 18 MCG inhalation capsule Place 1 capsule (18 mcg total) into inhaler and inhale daily. Patient not taking: Reported on 06/27/2016 05/26/14   Cristal Ford, DO     Vital Signs: BP (!) 150/82 (BP Location: Left Arm)   Pulse 86   Temp 97.5 F (36.4 C) (Oral)   Resp 18   Ht '6\' 2"'$  (1.88 m)   Wt (!) 323 lb 6.4 oz (146.7 kg)   SpO2 93%   BMI 41.52 kg/m   Physical Exam  Constitutional: He is oriented to person, place, and time.  Cardiovascular: Normal rate and regular rhythm.   Pulmonary/Chest:  Few wheezes left chest  Musculoskeletal: Normal range of motion.  Neurological: He is alert and oriented to person, place, and time.  Skin: Skin is warm and dry.  Site of drain is clean and dry Water sealed since yesterday  No air leak  Psychiatric: He has a normal mood and affect. His behavior is normal.  Nursing note and vitals reviewed.   Imaging: Dg Chest 1 View  Result Date: 07/04/2016 CLINICAL DATA:  Chest tube on left side.  Atelectasis. EXAM: CHEST 1 VIEW COMPARISON:  Jul 03, 2016 FINDINGS: Chest tube remains on the left without evident pneumothorax. There is patchy atelectatic change in both lower lung zones, stable. No new opacity. Heart size and pulmonary vascularity are normal. No adenopathy. There is aortic atherosclerosis. No bone lesions. IMPRESSION: No change in left-sided chest tube position without evident pneumothorax. Bibasilar atelectasis. No new opacity. Stable cardiac silhouette. There is aortic atherosclerosis. Electronically Signed   By: Lowella Grip III M.D.   On: 07/04/2016 08:28   Dg Chest 2 View  Result Date: 07/01/2016 CLINICAL DATA:  66 year old on CPAP at night for the past 3 years, presenting with acute  onset of shortness of breath after removing his CPAP. Patient had a left chest tube removed on 06/29/2016 for a pneumothorax. EXAM: CHEST  2 VIEW COMPARISON:  06/30/2016, 06/29/2016 and earlier. FINDINGS: Recurrent left pneumothorax on the order of 20-30% or so. The left lung at the base is tethered to the pleural by scar. The pneumothorax is predominantly lateral in location. Associated passive atelectasis in the left lower lobe, increased since yesterday. Lungs otherwise clear. Emphysematous changes in the upper lobes, prominent bronchovascular markings diffusely and mild central peribronchial thickening are unchanged. Cardiac silhouette normal in size, unchanged. Thoracic aorta atherosclerotic, unchanged. Hilar and mediastinal contours otherwise unremarkable. Degenerative changes and DISH involving the thoracic spine. IMPRESSION: 1. Recurrent left pneumothorax on the order of 20 through 30% or so, predominantly lateral in location. The left lung at the base is tethered to the pleural by scar. 2. Associated mild passive atelectasis in the left lower lobe, increased since yesterday. 3. Stable baseline COPD/emphysema. I telephoned this critical value/emergent result at the time of interpretation  on 07/01/2016 at 7:56 am to [place], the nurse caring for the patient, who verbally acknowledged these results. Electronically Signed   By: Evangeline Dakin M.D.   On: 07/01/2016 08:00   Dg Chest Port 1 View  Result Date: 07/03/2016 CLINICAL DATA:  66 year old male with spontaneous left pneumothorax status post CT-guided chest tube placement. EXAM: PORTABLE CHEST 1 VIEW COMPARISON:  07/02/2016 and earlier. FINDINGS: Portable AP semi upright view at 0718 hours. Pigtail type left chest tube remains in place. No pneumothorax identified. Patchy bibasilar scarring or atelectasis is stable. No pulmonary edema. No pleural effusion identified. Stable cardiac size and mediastinal contours. IMPRESSION: 1. Stable left chest tube with  no pneumothorax. 2. Stable ventilation with bibasilar atelectasis or scarring. Electronically Signed   By: Genevie Ann M.D.   On: 07/03/2016 08:42   Dg Chest Port 1 View  Result Date: 07/02/2016 CLINICAL DATA:  Follow-up left pneumothorax EXAM: PORTABLE CHEST 1 VIEW COMPARISON:  Chest radiograph from one day prior. FINDINGS: Left basilar pigtail chest tube is in place. Stable cardiomediastinal silhouette with normal heart size. No appreciable residual pneumothorax. No pleural effusion. Patchy bibasilar scarring versus atelectasis. No pulmonary edema. IMPRESSION: 1. No appreciable residual left pneumothorax status post left basilar chest tube placement. 2. Patchy bibasilar scarring versus atelectasis. Electronically Signed   By: Ilona Sorrel M.D.   On: 07/02/2016 09:10   Dg Chest Port 1 View  Result Date: 06/30/2016 CLINICAL DATA:  Status post chest tube removal EXAM: PORTABLE CHEST 1 VIEW COMPARISON:  Study obtained earlier in the day FINDINGS: Chest tube removal of side without evident pneumothorax. There is slight scarring in the bases. There is no edema or consolidation. The heart size and pulmonary vascularity are normal. No adenopathy. No bone lesions. IMPRESSION: No evident pneumothorax. Slight bibasilar scarring. No edema or consolidation. Electronically Signed   By: Lowella Grip III M.D.   On: 06/30/2016 09:50   Ct Perc Pleural Drain W/indwell Cath W/img Guide  Result Date: 07/01/2016 INDICATION: Recurrent spontaneous left pneumothorax with request for image guided chest tube placement. EXAM: CT-GUIDED LEFT THORACOSTOMY TUBE PLACEMENT MEDICATIONS: See below. ANESTHESIA/SEDATION: Fentanyl 75 mcg IV; Versed 2.0 mg IV Moderate Sedation Time:  25 minutes. The patient was continuously monitored during the procedure by the interventional radiology nurse under my direct supervision. COMPLICATIONS: None immediate. PROCEDURE: Informed written consent was obtained from the patient after a thorough discussion  of the procedural risks, benefits and alternatives. All questions were addressed. Maximal Sterile Barrier Technique was utilized including caps, mask, sterile gowns, sterile gloves, sterile drape, hand hygiene and skin antiseptic. A timeout was performed prior to the initiation of the procedure. Imaging was performed in a supine position. A site was marked along the left anterior chest wall. Under CT guidance, an 18 gauge trocar needle was advanced into the anterior pleural space. A guidewire was advanced. The tract was dilated and a 12 French pigtail catheter advanced into the pleural space. CT imaging was performed to confirm to position. The tube was secured at the skin with a Prolene retention suture, StatLock device and petroleum gauze dressing. The tube was connected to a [place] Pleur-Evac device which will be connected to wall suction at -20 cm of water. FINDINGS: Moderate-sized left-sided pneumothorax is present. The pigtail catheter was advanced into the anterior aspect of the pneumothorax at the level of the nipple line. IMPRESSION: CT-guided placement of left-sided pigtail chest tube within the pleural space to treat a recurrent pneumothorax. Electronically Signed   By:  Aletta Edouard M.D.   On: 07/01/2016 16:30    Labs:  CBC:  Recent Labs  06/27/16 1121 06/27/16 1711  WBC 12.8* 15.8*  HGB 17.6* 17.4*  HCT 51.7 51.4  PLT 246 248    COAGS:  Recent Labs  06/28/16 0208  INR 0.98    BMP:  Recent Labs  06/27/16 1121 06/27/16 1711 06/29/16 0526 07/04/16 0523  NA 137  --  136  --   K 4.5  --  4.7  --   CL 105  --  102  --   CO2 24  --  26  --   GLUCOSE 150*  --  95  --   BUN 13  --  20  --   CALCIUM 8.5*  --  8.6*  --   CREATININE 1.13 1.34* 1.11 1.13  GFRNONAA >60 54* >60 >60  GFRAA >60 >60 >60 >60    LIVER FUNCTION TESTS:  Recent Labs  06/27/16 1121  BILITOT 0.6  AST 50*  ALT 75*  ALKPHOS 76  PROT 6.9  ALBUMIN 3.9    Assessment and Plan:  Chest tube  drain intact Talc slurry per notes for today? Will follow  Electronically Signed: Shawana Knoch A 07/04/2016, 9:37 AM   I spent a total of 15 Minutes at the the patient's bedside AND on the patient's hospital floor or unit, greater than 50% of which was counseling/coordinating care for left chest tube drain

## 2016-07-04 NOTE — Progress Notes (Signed)
BP (!) 150/82 (BP Location: Left Arm)   Pulse 86   Temp 97.5 F (36.4 C) (Oral)   Resp 18   Ht '6\' 2"'$  (1.88 m)   Wt (!) 323 lb 6.4 oz (146.7 kg)   SpO2 93%   BMI 41.52 kg/m   Left Bedside Talc Pleurodesis performed with 2G sterile talc.  Patient adamantly refused instillation of second 2G of sterile talc.  Patient complained of intense pain post procedure.  He was treated with IV Morphine and Oxycodone.  Chest tube will remain clamped until 3 pm.   Junie Panning Mical Kicklighter PA-C

## 2016-07-05 ENCOUNTER — Inpatient Hospital Stay (HOSPITAL_COMMUNITY): Payer: Medicare Other

## 2016-07-05 DIAGNOSIS — J9311 Primary spontaneous pneumothorax: Secondary | ICD-10-CM

## 2016-07-05 NOTE — Progress Notes (Signed)
Patient states he is able to place self on and off of CPAP when ready. RT informed patient if he has any trouble have RN contact RT.

## 2016-07-05 NOTE — Progress Notes (Signed)
SATURATION QUALIFICATIONS: (This note is used to comply with regulatory documentation for home oxygen)  Patient Saturations on Room Air at Rest = 92%  Patient Saturations on Room Air while Ambulating = 86%    Please briefly explain why patient needs home oxygen:  Patient tolerated walk without oxygen poorly. Stats dropped to 86%, patient become short of breath and dyspneic. Patient requiring 2L of oxygen.  Cyndia Bent RN

## 2016-07-05 NOTE — Progress Notes (Addendum)
      [address]       [place] [address]             [phone]           Subjective: Patient had extreme pain after TALC yesterday. Has pain at chest tube site this am and he states if he moves "wrong" it is hard to take a deep breath.  Objective: Vital signs in last 24 hours: Temp:  [97.5 F (36.4 C)-98 F (36.7 C)] 97.5 F (36.4 C) (05/08 0346) Pulse Rate:  [88-95] 88 (05/08 0346) Cardiac Rhythm: Sinus tachycardia (05/07 1908) Resp:  [17-18] 18 (05/08 0346) BP: (126-145)/(64-94) 142/65 (05/08 0346) SpO2:  [93 %-95 %] 94 % (05/08 0346)     Intake/Output from previous day: 05/07 0701 - 05/08 0700 In: 960 [P.O.:960] Out: 400 [Urine:400]   Physical Exam:  Cardiovascular: RRR Pulmonary: Mostly clear to auscultation bilaterally Chest Tube: to water seal and no air leak  Lab Results: CBC: No results for input(s): WBC, HGB, HCT, PLT in the last 72 hours. BMET:   Recent Labs  07/04/16 0523  CREATININE 1.13    PT/INR:  No results for input(s): LABPROT, INR in the last 72 hours. ABG:  INR: Will add last result for INR, ABG once components are confirmed Will add last 4 CBG results once components are confirmed  Assessment/Plan:  1. CV - SR in the 80's. On Lisinopril 10 mg daily. 2.  Pulmonary - S/p 12 French chest tube by IR 05/04. S/p TALC slurry 05/047. On 2 liters of oxygen via [place].  Xopenex tid. CXR this am appears relatively stable.  3. OSA- using CPAP mask from home.. 4. Will discuss disposition with Dr. Suzanne Boron MPA-C 07/05/2016,7:14 AM   D/c chest tube today I have seen and examined Mario [name] and agree with the above assessment  and plan.  Grace Isaac MD Beeper [phone] Office [phone] 07/05/2016 3:04 PM

## 2016-07-05 NOTE — Progress Notes (Signed)
Referring Physician(s):  Dr. Lanelle Bal  Supervising Physician: Corrie Mckusick  Patient Status:  Mario [name] - In-pt  Chief Complaint: Spontaneous PTX s/p chest tube placement 5/4  Subjective: Patient sitting in chair; tired from poor sleep.  Still on 2L [place]  Allergies: Demerol [meperidine] and Zocor [simvastatin]  Medications: Prior to Admission medications   Medication Sig Start Date End Date Taking? Authorizing Provider  albuterol (PROVENTIL HFA;VENTOLIN HFA) 108 (90 Base) MCG/ACT inhaler Inhale 2 puffs into the lungs 4 (four) times daily.   Yes [provider]  amitriptyline (ELAVIL) 10 MG tablet Take 20 mg by mouth at bedtime.   Yes [provider]  aspirin 81 MG tablet Take 81 mg by mouth daily.   Yes [provider]  buPROPion (WELLBUTRIN SR) 150 MG 12 hr tablet Take 300 mg by mouth every morning.    Yes [provider]  carboxymethylcellulose 1 % ophthalmic solution Place 1 drop into both eyes at bedtime.   Yes [provider]  Cholecalciferol (VITAMIN D) 2000 units CAPS Take 2,000 Units by mouth daily.   Yes [provider]  clotrimazole (LOTRIMIN) 1 % cream Apply 1 application topically 2 (two) times daily.   Yes [provider]  diclofenac (VOLTAREN) 75 MG EC tablet Take 75 mg by mouth 2 (two) times daily.   Yes [provider]  DULoxetine (CYMBALTA) 30 MG capsule Take 1 capsule (30 mg total) by mouth daily. 07/21/14  Yes Evelina Bucy, MD  flunisolide (NASALIDE) 25 MCG/ACT (0.025%) SOLN Place 1 spray into both nostrils daily.    Yes [provider]  folic acid (FOLVITE) 1 MG tablet Take 1 mg by mouth daily.   Yes [provider]  Hypromellose (ARTIFICIAL TEARS OP) Place 1 drop into both eyes daily as needed (dry eyes).   Yes [provider]  Ipratropium-Albuterol (COMBIVENT RESPIMAT) 20-100 MCG/ACT AERS respimat Inhale 1 puff into the lungs every 6 (six) hours as needed for  wheezing.   Yes [provider]  lisinopril (PRINIVIL,ZESTRIL) 20 MG tablet Take 1 tablet (20 mg total) by mouth daily. For high blood pressure 01/21/13  Yes Mashburn, Marlane Hatcher, PA-C  methotrexate (RHEUMATREX) 2.5 MG tablet Take 15 mg by mouth once a week. Caution:Chemotherapy. Protect from light.   Yes [provider]  mirtazapine (REMERON) 15 MG tablet Take 15 mg by mouth at bedtime.   Yes [provider]  nicotine (NICODERM CQ - DOSED IN MG/24 HOURS) 21 mg/24hr patch Place 21 mg onto the skin daily.   Yes [provider]  prednisoLONE acetate (PRED FORTE) 1 % ophthalmic suspension Place 1 drop into the right eye every 2 (two) hours.   Yes [provider]  sennosides-docusate sodium (SENOKOT-S) 8.6-50 MG tablet Take 1-2 tablets by mouth daily as needed for constipation.   Yes [provider]  sodium chloride (OCEAN) 0.65 % SOLN nasal spray Place 2 sprays into both nostrils 4 (four) times daily as needed for congestion.   Yes [provider]  triamcinolone cream (KENALOG) 0.1 % Apply 1 application topically 2 (two) times daily.   Yes [provider]  ARIPiprazole (ABILIFY) 5 MG tablet Take 1 tablet (5 mg total) by mouth daily. For depression and anxiety. Patient not taking: Reported on 06/27/2016 07/21/14   Evelina Bucy, MD  atorvastatin (LIPITOR) 20 MG tablet Take 1 tablet (20 mg total) by mouth daily. Patient not taking: Reported on 07/21/2014 05/26/14   Cristal Ford, DO  meclizine Johnathan Hausen)  25 MG tablet Take 1 tablet (25 mg total) by mouth 3 (three) times daily as needed for dizziness. Patient not taking: Reported on 06/27/2016 01/08/14   Noland Fordyce, PA-C  pantoprazole (PROTONIX) 40 MG tablet Take 1 tablet (40 mg total) by mouth daily. Patient not taking: Reported on 07/21/2014 05/26/14   Cristal Ford, DO  predniSONE (DELTASONE) 20 MG tablet 3 tabs po day one, then 2 po daily x 4 days Patient not taking: Reported on  06/27/2016 07/21/14   Evelina Bucy, MD  tiotropium (SPIRIVA) 18 MCG inhalation capsule Place 1 capsule (18 mcg total) into inhaler and inhale daily. Patient not taking: Reported on 06/27/2016 05/26/14   Cristal Ford, DO     Vital Signs: BP (!) 142/65 (BP Location: Left Arm)   Pulse 88   Temp 97.5 F (36.4 C) (Oral)   Resp 18   Ht '6\' 2"'$  (1.88 m)   Wt (!) 323 lb 6.4 oz (146.7 kg)   SpO2 90%   BMI 41.52 kg/m   Physical Exam  Constitutional: He appears well-developed.  Pulmonary/Chest: Effort normal. No respiratory distress.  Anterior chest tube in place.  Serosangiunous output, but to water seal.  Talc presents in tube.   Nursing note and vitals reviewed.   Imaging: Dg Chest 1 View  Result Date: 07/04/2016 CLINICAL DATA:  Chest tube on left side.  Atelectasis. EXAM: CHEST 1 VIEW COMPARISON:  Jul 03, 2016 FINDINGS: Chest tube remains on the left without evident pneumothorax. There is patchy atelectatic change in both lower lung zones, stable. No new opacity. Heart size and pulmonary vascularity are normal. No adenopathy. There is aortic atherosclerosis. No bone lesions. IMPRESSION: No change in left-sided chest tube position without evident pneumothorax. Bibasilar atelectasis. No new opacity. Stable cardiac silhouette. There is aortic atherosclerosis. Electronically Signed   By: Lowella Grip III M.D.   On: 07/04/2016 08:28   Dg Chest Port 1 View  Result Date: 07/05/2016 CLINICAL DATA:  Spontaneous left pneumothorax with chest tube treatment EXAM: PORTABLE CHEST 1 VIEW COMPARISON:  Portable chest x-ray of Jul 04, 2016 FINDINGS: The lungs are less well inflated today. No pneumothorax on the left is observed. The small caliber chest tube projects over the lower hemithorax. There is atelectasis at the left lung base. Minimal persistent density at the right lung base likely reflects atelectasis is well. The heart is normal in size. The pulmonary vascularity is not engorged. The bony thorax  exhibits no acute abnormality. IMPRESSION: No definite pneumothorax is observed. There is persistent bibasilar atelectasis more conspicuous today in part due to hypoinflation. The chest tube appears to be in stable position projecting over the posterior 8-9 left rib interspaces. Electronically Signed   By: David  Martinique M.D.   On: 07/05/2016 07:21   Dg Chest Port 1 View  Result Date: 07/03/2016 CLINICAL DATA:  66 year old male with spontaneous left pneumothorax status post CT-guided chest tube placement. EXAM: PORTABLE CHEST 1 VIEW COMPARISON:  07/02/2016 and earlier. FINDINGS: Portable AP semi upright view at 0718 hours. Pigtail type left chest tube remains in place. No pneumothorax identified. Patchy bibasilar scarring or atelectasis is stable. No pulmonary edema. No pleural effusion identified. Stable cardiac size and mediastinal contours. IMPRESSION: 1. Stable left chest tube with no pneumothorax. 2. Stable ventilation with bibasilar atelectasis or scarring. Electronically Signed   By: Genevie Ann M.D.   On: 07/03/2016 08:42   Dg Chest Port 1 View  Result Date: 07/02/2016 CLINICAL DATA:  Follow-up left pneumothorax EXAM: PORTABLE  CHEST 1 VIEW COMPARISON:  Chest radiograph from one day prior. FINDINGS: Left basilar pigtail chest tube is in place. Stable cardiomediastinal silhouette with normal heart size. No appreciable residual pneumothorax. No pleural effusion. Patchy bibasilar scarring versus atelectasis. No pulmonary edema. IMPRESSION: 1. No appreciable residual left pneumothorax status post left basilar chest tube placement. 2. Patchy bibasilar scarring versus atelectasis. Electronically Signed   By: Ilona Sorrel M.D.   On: 07/02/2016 09:10   Ct Perc Pleural Drain W/indwell Cath W/img Guide  Result Date: 07/01/2016 INDICATION: Recurrent spontaneous left pneumothorax with request for image guided chest tube placement. EXAM: CT-GUIDED LEFT THORACOSTOMY TUBE PLACEMENT MEDICATIONS: See below.  ANESTHESIA/SEDATION: Fentanyl 75 mcg IV; Versed 2.0 mg IV Moderate Sedation Time:  25 minutes. The patient was continuously monitored during the procedure by the interventional radiology nurse under my direct supervision. COMPLICATIONS: None immediate. PROCEDURE: Informed written consent was obtained from the patient after a thorough discussion of the procedural risks, benefits and alternatives. All questions were addressed. Maximal Sterile Barrier Technique was utilized including caps, mask, sterile gowns, sterile gloves, sterile drape, hand hygiene and skin antiseptic. A timeout was performed prior to the initiation of the procedure. Imaging was performed in a supine position. A site was marked along the left anterior chest wall. Under CT guidance, an 18 gauge trocar needle was advanced into the anterior pleural space. A guidewire was advanced. The tract was dilated and a 12 French pigtail catheter advanced into the pleural space. CT imaging was performed to confirm to position. The tube was secured at the skin with a Prolene retention suture, StatLock device and petroleum gauze dressing. The tube was connected to a [place] Pleur-Evac device which will be connected to wall suction at -20 cm of water. FINDINGS: Moderate-sized left-sided pneumothorax is present. The pigtail catheter was advanced into the anterior aspect of the pneumothorax at the level of the nipple line. IMPRESSION: CT-guided placement of left-sided pigtail chest tube within the pleural space to treat a recurrent pneumothorax. Electronically Signed   By: Aletta Edouard M.D.   On: 07/01/2016 16:30    Labs:  CBC:  Recent Labs  06/27/16 1121 06/27/16 1711  WBC 12.8* 15.8*  HGB 17.6* 17.4*  HCT 51.7 51.4  PLT 246 248    COAGS:  Recent Labs  06/28/16 0208  INR 0.98    BMP:  Recent Labs  06/27/16 1121 06/27/16 1711 06/29/16 0526 07/04/16 0523  NA 137  --  136  --   K 4.5  --  4.7  --   CL 105  --  102  --   CO2 24  --   26  --   GLUCOSE 150*  --  95  --   BUN 13  --  20  --   CALCIUM 8.5*  --  8.6*  --   CREATININE 1.13 1.34* 1.11 1.13  GFRNONAA >60 54* >60 >60  GFRAA >60 >60 >60 >60    LIVER FUNCTION TESTS:  Recent Labs  06/27/16 1121  BILITOT 0.6  AST 50*  ALT 75*  ALKPHOS 76  PROT 6.9  ALBUMIN 3.9    Assessment and Plan: Spontaneous pneumothorax Patient received talc slurry tomorrow which he was reports was extremely painful.  His tube is in place and to water seal today.   He is still on 2L [place].  CXR stable today. Plan per TCTS.  IR to follow.  Electronically Signed: Docia Barrier 07/05/2016, 1:24 PM   I spent a total  of 15 Minutes at the the patient's bedside AND on the patient's [name] floor or unit, greater than 50% of which was counseling/coordinating care for spontaneous PTX

## 2016-07-05 NOTE — Progress Notes (Signed)
Removed chest tube per MD order. Patient tolerated well. Occlusive dressing placed. Will continue to monitor.  Cyndia Bent RN

## 2016-07-06 ENCOUNTER — Inpatient Hospital Stay (HOSPITAL_COMMUNITY): Payer: Medicare Other

## 2016-07-06 MED ORDER — TRAMADOL HCL 50 MG PO TABS
50.0000 mg | ORAL_TABLET | Freq: Four times a day (QID) | ORAL | Status: DC | PRN
  Administered 2016-07-06 – 2016-07-07 (×4): 100 mg via ORAL
  Filled 2016-07-06 (×4): qty 2

## 2016-07-06 MED ORDER — OXYCODONE HCL 10 MG PO TABS: ORAL_TABLET | ORAL | 0 refills | Status: DC

## 2016-07-06 MED ORDER — GABAPENTIN 300 MG PO CAPS: 600.0000 mg | ORAL_CAPSULE | Freq: Two times a day (BID) | ORAL | 1 refills | Status: DC

## 2016-07-06 NOTE — Care Management Important Message (Signed)
Important Message  Patient Details  Name: Mario [name] MRN: [id number] Date of Birth: 11-09-1950   Medicare Important Message Given:  Yes    Ramyah Pankowski Abena 07/06/2016, 11:20 AM

## 2016-07-06 NOTE — Progress Notes (Addendum)
      [address]       [place] [address]             [phone]           Subjective: He is eating breakfast this am. Patient states has "pain all over" and feels as though he is having muscle spasms.   Objective: Vital signs in last 24 hours: Temp:  [97.7 F (36.5 C)-98 F (36.7 C)] 98 F (36.7 C) (05/09 0550) Pulse Rate:  [95-102] 102 (05/09 0550) Cardiac Rhythm: Sinus tachycardia (05/09 0700) Resp:  [18-24] 24 (05/09 0550) BP: (118-137)/(62-75) 137/67 (05/09 0550) SpO2:  [90 %-98 %] 91 % (05/09 0550)     Intake/Output from previous day: 05/08 0701 - 05/09 0700 In: 720 [P.O.:720] Out: 1750 [Urine:1750]   Physical Exam:  Cardiovascular: Slightly tachycardic Pulmonary: Mostly clear to auscultation bilaterally   Lab Results: CBC: No results for input(s): WBC, HGB, HCT, PLT in the last 72 hours. BMET:   Recent Labs  07/04/16 0523  CREATININE 1.13    PT/INR:  No results for input(s): LABPROT, INR in the last 72 hours. ABG:  INR: Will add last result for INR, ABG once components are confirmed Will add last 4 CBG results once components are confirmed  Assessment/Plan:  1. CV - Tachycardic in the low 100's at times. On Lisinopril 10 mg daily. BP improved so will resume home dose of 20 mg daily at discharge. Will not start a BB secondary to COPD. Perhaps some of the tachycardia is related to pain as it happened after TALC. 2.  Pulmonary - S/p TALC slurry 05/07. Chest tube removed yesterday. On 2 liters of oxygen via [place].  Will need oxygen at discharge. Xopenex tid. CXR this am appears stable. 3. OSA- using CPAP mask from home.. 4. Possible discharge home later today  ZIMMERMAN,DONIELLE MPA-C 07/06/2016,8:02 AM   Patient still slow walking Plan d/c in am On home o2 , has concentrator at home I have seen and examined Corliss Marcus and agree with the above assessment  and plan.  Grace Isaac MD Beeper [phone] Office  [phone] 07/06/2016 11:58 AM

## 2016-07-07 ENCOUNTER — Ambulatory Visit: Payer: Self-pay

## 2016-07-07 DIAGNOSIS — J9311 Primary spontaneous pneumothorax: Secondary | ICD-10-CM

## 2016-07-07 MED ORDER — AMOXICILLIN-POT CLAVULANATE 875-125 MG PO TABS: 1.0000 | ORAL_TABLET | Freq: Two times a day (BID) | ORAL | 0 refills | Status: AC

## 2016-07-07 NOTE — Progress Notes (Signed)
Spoke with Mario [name] at [facility] who manages home oxygen. She stated patient with l oxygen needs as demonstrated by sats 92% RA at rest, desat to 80's with exertion and recovered to 90s with 2L is considered as needing oxygen with ertional. Faxed sats to her at [phone]. She will contact [facility], to update DME order.

## 2016-07-07 NOTE — Progress Notes (Signed)
D/c instructions discussed with patient and pt verbalized understanding. Pt's roommate is coming to take him home. Pt states he is going home directly from the hospital, and he understands that he needs oxygen for exertion. Pt and case manager have spoken to a staff at the [place] and pt will have a portable oxygen tank delivered to him at home (he currently has a big o2 tank at home that he was using).

## 2016-07-07 NOTE — Progress Notes (Addendum)
Faxed H&P, DC note, facesheet, orders for O2 (now at continuous, was nocturnal pta) and new scripts to [place] at [phone]. Spoke with [place] Skye in dr Marisa Cyphers office [phone] ext [phone] to expidite O2 order getting rewritten by [facility], faxed to [facility], and to have [facility] contact [facility]  provide O2 for transportation home to hospital room today as patient has a DC order.  [employer] [phone]

## 2016-07-07 NOTE — Progress Notes (Signed)
SATURATION QUALIFICATIONS: (This note is used to comply with regulatory documentation for home oxygen)  Patient Saturations on Room Air at Rest =92%  Patient Saturations on Room Air while Ambulating = 82 %  Patient Saturations on 2 Liters of oxygen while Ambulating = 91 %  Please briefly explain why patient needs home oxygen: see above

## 2016-07-07 NOTE — Progress Notes (Addendum)
      [address]       [place] [address]             [phone]      Subjective:  Mario [name] states " I'm sore as hell".  He also states he coughed up a big ball of phlegm that was green and had some blood in it.  Objective: Vital signs in last 24 hours: Temp:  [97.6 F (36.4 C)-98.2 F (36.8 C)] 98.2 F (36.8 C) (05/10 0609) Pulse Rate:  [69-100] 69 (05/10 0609) Cardiac Rhythm: Normal sinus rhythm (05/09 1900) Resp:  [18] 18 (05/10 0609) BP: (117-134)/(40-75) 117/40 (05/10 0609) SpO2:  [90 %-99 %] 99 % (05/10 0609) Weight:  [330 lb 1.6 oz (149.7 kg)] 330 lb 1.6 oz (149.7 kg) (05/10 0609)  Intake/Output from previous day: 05/09 0701 - 05/10 0700 In: 480 [P.O.:480] Out: 250 [Urine:250]  General appearance: alert, cooperative and no distress Heart: regular rate and rhythm Lungs: clear to auscultation bilaterally Abdomen: soft, non-tender; bowel sounds normal; no masses,  no organomegaly Wound: clean and dry  Lab Results: No results for input(s): WBC, HGB, HCT, PLT in the last 72 hours. BMET: No results for input(s): NA, K, CL, CO2, GLUCOSE, BUN, CREATININE, CALCIUM in the last 72 hours.  PT/INR: No results for input(s): LABPROT, INR in the last 72 hours. ABG No results found for: PHART, HCO3, TCO2, ACIDBASEDEF, O2SAT CBG (last 3)  No results for input(s): GLUCAP in the last 72 hours.  Dg Chest 2 View  Result Date: 07/06/2016 CLINICAL DATA:  Left-sided chest tube removal yesterday. Persistent left-sided chest pain. History of COPD, previous MI, left pneumothorax. EXAM: CHEST  2 VIEW COMPARISON:  Portable chest x-ray of Jul 05, 2016. FINDINGS: The small caliber left-sided chest tube has been removed. There is no pneumothorax. There is persistent left basilar atelectasis or less likely infiltrate. The right lung is adequately inflated. Minimal right basilar interstitial density is present but has improved. The heart is normal in size. The pulmonary  vascularity is not engorged. The mediastinum is normal in width. The bony thorax exhibits no acute abnormality. IMPRESSION: No reaccumulation of left-sided pneumothorax since chest tube removal. There is persistent left basilar atelectasis or pneumonia. Minimal right basilar atelectasis persists but is less conspicuous today. Electronically Signed   By: David  Martinique M.D.   On: 07/06/2016 08:05    Assessment/Plan:  1. CV- Sinus Tach, Bp controlled- will continue Lisinopril 2. Pulm- S/P Talc slurry, pain is likely related to this, uses oxygen at home, green sputum could be early pneumonia with poor lungs and questionable opacity on lungs... Will d/c on empiric ABX 3. Dispo- patient stable, will give 10 days of Augmentin for possible early pneumonia, continue home oxygen use, d/c home today   LOS: 10 days    Ellwood Handler 07/07/2016  Xray reviewed no left ptx, suspect "early pneumonia " on xray is reation to talc, but will cover with antibiotics  No fever  I have seen and examined Mario [name] and agree with the above assessment  and plan.  Grace Isaac MD Beeper [phone] Office [phone] 07/07/2016 8:45 AM

## 2016-07-11 ENCOUNTER — Ambulatory Visit: Payer: Medicare Other

## 2016-07-14 ENCOUNTER — Inpatient Hospital Stay: Payer: Self-pay | Admitting: Acute Care

## 2016-09-17 ENCOUNTER — Encounter (HOSPITAL_COMMUNITY): Payer: Self-pay | Admitting: Emergency Medicine

## 2016-09-17 ENCOUNTER — Emergency Department (HOSPITAL_COMMUNITY): Payer: Non-veteran care

## 2016-09-17 ENCOUNTER — Observation Stay (HOSPITAL_COMMUNITY)
Admission: EM | Admit: 2016-09-17 | Discharge: 2016-09-18 | Discharge status: Against Medical Advice | Payer: Non-veteran care | Attending: Family Medicine | Admitting: Family Medicine

## 2016-09-17 ENCOUNTER — Other Ambulatory Visit: Payer: Self-pay

## 2016-09-17 DIAGNOSIS — Z6841 Body Mass Index (BMI) 40.0 and over, adult: Secondary | ICD-10-CM | POA: Diagnosis not present

## 2016-09-17 DIAGNOSIS — I10 Essential (primary) hypertension: Secondary | ICD-10-CM | POA: Diagnosis not present

## 2016-09-17 DIAGNOSIS — G4733 Obstructive sleep apnea (adult) (pediatric): Secondary | ICD-10-CM | POA: Diagnosis not present

## 2016-09-17 DIAGNOSIS — E875 Hyperkalemia: Secondary | ICD-10-CM | POA: Diagnosis not present

## 2016-09-17 DIAGNOSIS — I252 Old myocardial infarction: Secondary | ICD-10-CM | POA: Diagnosis not present

## 2016-09-17 DIAGNOSIS — J441 Chronic obstructive pulmonary disease with (acute) exacerbation: Secondary | ICD-10-CM | POA: Diagnosis not present

## 2016-09-17 DIAGNOSIS — F419 Anxiety disorder, unspecified: Secondary | ICD-10-CM | POA: Diagnosis not present

## 2016-09-17 DIAGNOSIS — Z79899 Other long term (current) drug therapy: Secondary | ICD-10-CM | POA: Diagnosis not present

## 2016-09-17 DIAGNOSIS — F332 Major depressive disorder, recurrent severe without psychotic features: Secondary | ICD-10-CM | POA: Diagnosis not present

## 2016-09-17 DIAGNOSIS — R079 Chest pain, unspecified: Secondary | ICD-10-CM | POA: Diagnosis present

## 2016-09-17 DIAGNOSIS — F17210 Nicotine dependence, cigarettes, uncomplicated: Secondary | ICD-10-CM | POA: Insufficient documentation

## 2016-09-17 DIAGNOSIS — E669 Obesity, unspecified: Secondary | ICD-10-CM | POA: Insufficient documentation

## 2016-09-17 LAB — CBC
HCT: 46.2 % (ref 39.0–52.0)
Hemoglobin: 15 g/dL (ref 13.0–17.0)
MCH: 32.3 pg (ref 26.0–34.0)
MCHC: 32.5 g/dL (ref 30.0–36.0)
MCV: 99.6 fL (ref 78.0–100.0)
Platelets: 262 10*3/uL (ref 150–400)
RBC: 4.64 MIL/uL (ref 4.22–5.81)
RDW: 15.6 % — ABNORMAL HIGH (ref 11.5–15.5)
WBC: 13.6 10*3/uL — ABNORMAL HIGH (ref 4.0–10.5)

## 2016-09-17 LAB — I-STAT TROPONIN, ED: Troponin i, poc: 0 ng/mL (ref 0.00–0.08)

## 2016-09-17 LAB — BASIC METABOLIC PANEL
Anion gap: 10 (ref 5–15)
BUN: 14 mg/dL (ref 6–20)
CO2: 26 mmol/L (ref 22–32)
Calcium: 9.2 mg/dL (ref 8.9–10.3)
Chloride: 99 mmol/L — ABNORMAL LOW (ref 101–111)
Creatinine, Ser: 1.23 mg/dL (ref 0.61–1.24)
GFR calc Af Amer: >60 mL/min (ref >60)
GFR calc non Af Amer: >60 mL/min (ref >60)
Glucose, Bld: 121 mg/dL — ABNORMAL HIGH (ref 65–99)
Potassium: 5.3 mmol/L — ABNORMAL HIGH (ref 3.5–5.1)
Sodium: 135 mmol/L (ref 135–145)

## 2016-09-17 LAB — D-DIMER, QUANTITATIVE (NOT AT ARMC): D-Dimer, Quant: 0.81 ug/mL-FEU — ABNORMAL HIGH (ref 0.00–0.50)

## 2016-09-17 LAB — TSH: TSH: 1.501 u[IU]/mL (ref 0.350–4.500)

## 2016-09-17 LAB — BRAIN NATRIURETIC PEPTIDE: B Natriuretic Peptide: 12.3 pg/mL (ref 0.0–100.0)

## 2016-09-17 LAB — TROPONIN I: Troponin I: <0.03 ng/mL (ref <0.03)

## 2016-09-17 LAB — LIPID PANEL
Cholesterol: 184 mg/dL (ref 0–200)
HDL: 42 mg/dL (ref >40)
LDL Cholesterol: 103 mg/dL — ABNORMAL HIGH (ref 0–99)
Total CHOL/HDL Ratio: 4.4 RATIO
Triglycerides: 197 mg/dL — ABNORMAL HIGH (ref <150)
VLDL: 39 mg/dL (ref 0–40)

## 2016-09-17 MED ORDER — ACETAMINOPHEN 325 MG PO TABS: 650.0000 mg | ORAL_TABLET | ORAL | Status: DC | PRN

## 2016-09-17 MED ORDER — LURASIDONE HCL 20 MG PO TABS
20.0000 mg | ORAL_TABLET | Freq: Every day | ORAL | Status: DC
  Administered 2016-09-17: 20 mg via ORAL
  Filled 2016-09-17 (×2): qty 1

## 2016-09-17 MED ORDER — HYDROCODONE-ACETAMINOPHEN 5-325 MG PO TABS: 2.0000 | ORAL_TABLET | Freq: Two times a day (BID) | ORAL | Status: DC | PRN

## 2016-09-17 MED ORDER — TIOTROPIUM BROMIDE MONOHYDRATE 18 MCG IN CAPS
18.0000 ug | ORAL_CAPSULE | Freq: Every day | RESPIRATORY_TRACT | Status: DC
  Administered 2016-09-18: 18 ug via RESPIRATORY_TRACT
  Filled 2016-09-17: qty 5

## 2016-09-17 MED ORDER — ONDANSETRON HCL 4 MG/2ML IJ SOLN: 4.0000 mg | Freq: Four times a day (QID) | INTRAMUSCULAR | Status: DC | PRN

## 2016-09-17 MED ORDER — ALBUTEROL SULFATE (2.5 MG/3ML) 0.083% IN NEBU: 2.5000 mg | INHALATION_SOLUTION | Freq: Four times a day (QID) | RESPIRATORY_TRACT | Status: DC | PRN

## 2016-09-17 MED ORDER — NITROGLYCERIN 0.4 MG SL SUBL
0.4000 mg | SUBLINGUAL_TABLET | SUBLINGUAL | Status: DC | PRN
Start: 2016-09-17 — End: 2016-09-18

## 2016-09-17 MED ORDER — ACETAMINOPHEN 500 MG PO TABS
1000.0000 mg | ORAL_TABLET | Freq: Once | ORAL | Status: AC
  Administered 2016-09-17: 1000 mg via ORAL
  Filled 2016-09-17: qty 2

## 2016-09-17 MED ORDER — PREDNISONE 5 MG PO TABS
5.0000 mg | ORAL_TABLET | Freq: Every day | ORAL | Status: DC
Start: 2016-09-18 — End: 2016-09-18
  Administered 2016-09-18: 5 mg via ORAL
  Filled 2016-09-17: qty 1

## 2016-09-17 MED ORDER — ENOXAPARIN SODIUM 40 MG/0.4ML ~~LOC~~ SOLN
40.0000 mg | SUBCUTANEOUS | Status: DC
  Administered 2016-09-17: 40 mg via SUBCUTANEOUS
  Filled 2016-09-17: qty 0.4

## 2016-09-17 MED ORDER — TIOTROPIUM BROMIDE MONOHYDRATE 18 MCG IN CAPS
18.0000 ug | ORAL_CAPSULE | Freq: Every day | RESPIRATORY_TRACT | Status: DC
  Filled 2016-09-17: qty 5

## 2016-09-17 MED ORDER — METHOTREXATE 2.5 MG PO TABS
15.0000 mg | ORAL_TABLET | ORAL | Status: DC
  Administered 2016-09-17: 15 mg via ORAL
  Filled 2016-09-17: qty 6

## 2016-09-17 MED ORDER — SENNOSIDES-DOCUSATE SODIUM 8.6-50 MG PO TABS: 1.0000 | ORAL_TABLET | Freq: Every evening | ORAL | Status: DC | PRN

## 2016-09-17 MED ORDER — IPRATROPIUM-ALBUTEROL 0.5-2.5 (3) MG/3ML IN SOLN: 3.0000 mL | Freq: Four times a day (QID) | RESPIRATORY_TRACT | Status: DC | PRN

## 2016-09-17 MED ORDER — GABAPENTIN 300 MG PO CAPS
900.0000 mg | ORAL_CAPSULE | Freq: Three times a day (TID) | ORAL | Status: DC
  Administered 2016-09-17 – 2016-09-18 (×2): 900 mg via ORAL
  Filled 2016-09-17 (×2): qty 3

## 2016-09-17 MED ORDER — ASPIRIN EC 81 MG PO TBEC
81.0000 mg | DELAYED_RELEASE_TABLET | Freq: Every day | ORAL | Status: DC
  Administered 2016-09-18: 81 mg via ORAL
  Filled 2016-09-17: qty 1

## 2016-09-17 MED ORDER — DULOXETINE HCL 60 MG PO CPEP
60.0000 mg | ORAL_CAPSULE | Freq: Every day | ORAL | Status: DC
  Administered 2016-09-18: 60 mg via ORAL
  Filled 2016-09-17: qty 1

## 2016-09-17 MED ORDER — DULOXETINE HCL 30 MG PO CPEP
30.0000 mg | ORAL_CAPSULE | Freq: Every day | ORAL | Status: DC
  Administered 2016-09-17: 30 mg via ORAL
  Filled 2016-09-17: qty 1

## 2016-09-17 MED ORDER — DULOXETINE HCL 30 MG PO CPEP: 30.0000 mg | ORAL_CAPSULE | ORAL | Status: DC

## 2016-09-17 MED ORDER — IOPAMIDOL (ISOVUE-370) INJECTION 76%
INTRAVENOUS | Status: AC
  Administered 2016-09-17: 100 mL
  Filled 2016-09-17: qty 100

## 2016-09-17 MED ORDER — BUPROPION HCL ER (XL) 300 MG PO TB24
300.0000 mg | ORAL_TABLET | Freq: Every day | ORAL | Status: DC
  Administered 2016-09-18: 300 mg via ORAL
  Filled 2016-09-17: qty 1

## 2016-09-17 MED ORDER — ASPIRIN 81 MG PO CHEW
324.0000 mg | CHEWABLE_TABLET | Freq: Once | ORAL | Status: AC
  Administered 2016-09-17: 324 mg via ORAL
  Filled 2016-09-17: qty 4

## 2016-09-17 MED ORDER — LISINOPRIL 20 MG PO TABS
40.0000 mg | ORAL_TABLET | Freq: Every day | ORAL | Status: DC
  Administered 2016-09-18: 40 mg via ORAL
  Filled 2016-09-17: qty 2

## 2016-09-17 NOTE — ED Notes (Signed)
Pt ambulated to restroom, no issues.

## 2016-09-17 NOTE — ED Provider Notes (Signed)
[facility] DEPT Provider Note   CSN: [id number] Arrival date & time: 09/17/16  1437     History   Chief Complaint Chief Complaint  Patient presents with  . Hypertension  . Chest Pain    HPI Momodou Consiglio is a 66 y.o. male.  Patient presents with high blood pressure chest pain and shortness of breath. Patient's history of COPD on oxygen only occasionally with exertion. Patient's on lisinopril and doubled his medicines to help his rising blood pressure. Patient's had intermittent chest pressure at times with exertion for the past 2-3 days. No cardiac history. Patient quit smoking in April. Patient has had a pneumothorax and chest tube in the past. Patient states last stress test was 3 years ago and is followed by the [place].      Past Medical History:  Diagnosis Date  . Anxiety   . Bronchitis   . COPD (chronic obstructive pulmonary disease) ([place])   . Depression   . Hypertension   . Mental disorder   . MI (myocardial infarction) ([place])    ????  . OSA (obstructive sleep apnea)   . Suicide attempt ([place])   . Tension pneumothorax 06/27/2016    Patient Active Problem List   Diagnosis Date Noted  . Spontaneous pneumothorax 06/27/2016  . Hypoxia   . Pneumothorax on left   . Dyspnea 05/23/2014  . COPD exacerbation ([place]) 05/23/2014  . Malingering 01/21/2013  . MDD (major depressive disorder), recurrent severe, without psychosis ([place]) 01/13/2013  . Depression 01/11/2013  . Tobacco abuse 01/11/2013  . Obesity, unspecified 01/11/2013  . Old MI (myocardial infarction) 01/11/2013  . Chest pain 01/10/2013  . HTN (hypertension) 01/10/2013    Past Surgical History:  Procedure Laterality Date  . CHEST TUBE INSERTION Left 06/27/2016  . cryptorchidism    . SKIN CANCER EXCISION         Home Medications    Prior to Admission medications   Medication Sig Start Date End Date Taking? Authorizing Provider  ARIPiprazole (ABILIFY) 5 MG tablet Take 1 tablet (5 mg total) by  mouth daily. For depression and anxiety. Patient not taking: Reported on 06/27/2016 07/21/14   Evelina Bucy, MD  aspirin 81 MG tablet Take 81 mg by mouth daily.    [provider]  atorvastatin (LIPITOR) 20 MG tablet Take 1 tablet (20 mg total) by mouth daily. Patient not taking: Reported on 07/21/2014 05/26/14   Cristal Ford, DO  buPROPion [facility] SR) 150 MG 12 hr tablet Take 300 mg by mouth every morning.     [provider]  carboxymethylcellulose 1 % ophthalmic solution Place 1 drop into both eyes at bedtime.    [provider]  Cholecalciferol (VITAMIN D) 2000 units CAPS Take 2,000 Units by mouth daily.    [provider]  clotrimazole (LOTRIMIN) 1 % cream Apply 1 application topically 2 (two) times daily.    [provider]  diclofenac (VOLTAREN) 75 MG EC tablet Take 75 mg by mouth 2 (two) times daily.    [provider]  DULoxetine (CYMBALTA) 30 MG capsule Take 1 capsule (30 mg total) by mouth daily. 07/21/14   Evelina Bucy, MD  flunisolide (NASALIDE) 25 MCG/ACT (0.025%) SOLN Place 1 spray into both nostrils daily.     [provider]  folic acid (FOLVITE) 1 MG tablet Take 1 mg by mouth daily.    [provider]  gabapentin (NEURONTIN) 300 MG capsule Take 2 capsules (600 mg total) by mouth 2 (two) times daily. 07/06/16  Lars Pinks M, PA-C  Hypromellose (ARTIFICIAL TEARS OP) Place 1 drop into both eyes daily as needed (dry eyes).    [provider]  Ipratropium-Albuterol (COMBIVENT RESPIMAT) 20-100 MCG/ACT AERS respimat Inhale 1 puff into the lungs every 6 (six) hours as needed for wheezing.    [provider]  lisinopril (PRINIVIL,ZESTRIL) 20 MG tablet Take 1 tablet (20 mg total) by mouth daily. For high blood pressure 01/21/13   Nena Polio T, PA-C  meclizine (ANTIVERT) 25 MG tablet Take 1 tablet (25 mg total) by mouth 3 (three) times daily as needed for dizziness. Patient not taking:  Reported on 06/27/2016 01/08/14   Noe Gens, PA-C  methotrexate (RHEUMATREX) 2.5 MG tablet Take 15 mg by mouth once a week. Caution:Chemotherapy. Protect from light.    [provider]  mirtazapine (REMERON) 15 MG tablet Take 15 mg by mouth at bedtime.    [provider]  nicotine (NICODERM CQ - DOSED IN MG/24 HOURS) 21 mg/24hr patch Place 21 mg onto the skin daily.    [provider]  oxyCODONE 10 MG TABS Take 10 mg by mouth every 4-6 hours PRN severe pain. 07/06/16   Nani Skillern, PA-C  pantoprazole (PROTONIX) 40 MG tablet Take 1 tablet (40 mg total) by mouth daily. Patient not taking: Reported on 07/21/2014 05/26/14   Cristal Ford, DO  prednisoLONE acetate (PRED FORTE) 1 % ophthalmic suspension Place 1 drop into the right eye every 2 (two) hours.    [provider]  sennosides-docusate sodium (SENOKOT-S) 8.6-50 MG tablet Take 1-2 tablets by mouth daily as needed for constipation.    [provider]  sodium chloride (OCEAN) 0.65 % SOLN nasal spray Place 2 sprays into both nostrils 4 (four) times daily as needed for congestion.    [provider]  tiotropium (SPIRIVA) 18 MCG inhalation capsule Place 1 capsule (18 mcg total) into inhaler and inhale daily. Patient not taking: Reported on 06/27/2016 05/26/14   Cristal Ford, DO  triamcinolone cream (KENALOG) 0.1 % Apply 1 application topically 2 (two) times daily.    [provider]    Family History Family History  Problem Relation Age of Onset  . Dementia Father     Social History Social History  Substance Use Topics  . Smoking status: Current Every Day Smoker    Packs/day: 0.50    Years: 20.00    Types: Cigarettes  . Smokeless tobacco: Never Used  . Alcohol use No     Comment: denies use of any drugs or alcohol     Allergies   Demerol [meperidine] and Zocor [simvastatin]   Review of Systems Review of Systems  Constitutional: Negative for chills and  fever.  HENT: Negative for congestion.   Eyes: Negative for visual disturbance.  Respiratory: Positive for shortness of breath.   Cardiovascular: Positive for chest pain.  Gastrointestinal: Negative for abdominal pain and vomiting.  Genitourinary: Negative for dysuria and flank pain.  Musculoskeletal: Negative for back pain, neck pain and neck stiffness.  Skin: Negative for rash.  Neurological: Positive for light-headedness. Negative for headaches.     Physical Exam Updated Vital Signs BP (!) 124/94   Pulse (!) 115   Temp 98.8 F (37.1 C) (Oral)   Resp 20   Ht 6\' 2"  (1.88 m)   Wt (!) 142.9 kg (315 lb)   SpO2 93%   BMI 40.44 kg/m   Physical Exam  Constitutional: He is oriented to person, place, and time. He appears  well-developed and well-nourished.  HENT:  Head: Normocephalic and atraumatic.  Eyes: Conjunctivae are normal. Right eye exhibits no discharge. Left eye exhibits no discharge.  Neck: Normal range of motion. Neck supple. No tracheal deviation present.  Cardiovascular: Regular rhythm.  Tachycardia present.   Pulmonary/Chest: Effort normal and breath sounds normal.  Abdominal: Soft. He exhibits no distension. There is no tenderness. There is no guarding.  Musculoskeletal: He exhibits edema (mild bilateral lower).  Neurological: He is alert and oriented to person, place, and time.  Skin: Skin is warm. No rash noted.  Psychiatric: He has a normal mood and affect.  Nursing note and vitals reviewed.    ED Treatments / Results  Labs (all labs ordered are listed, but only abnormal results are displayed) Labs Reviewed  CBC - Abnormal; Notable for the following:       Result Value   WBC 13.6 (*)    RDW 15.6 (*)    All other components within normal limits  BASIC METABOLIC PANEL  BRAIN NATRIURETIC PEPTIDE  D-DIMER, QUANTITATIVE (NOT AT [facility])  I-STAT TROPONIN, ED    EKG  EKG Interpretation  Date/Time:  Saturday September 17 2016 14:39:14 EDT Ventricular Rate:    115 PR Interval:  180 QRS Duration: 88 QT Interval:  304 QTC Calculation: 420 R Axis:   20 Text Interpretation:  Sinus tachycardia Otherwise normal ECG Confirmed by Elnora Morrison [phone]) on 09/17/2016 3:08:42 PM       Radiology Dg Chest 2 View  Result Date: 09/17/2016 CLINICAL DATA:  Elevated blood pressure for 3 days, lightheaded, history hypertension, COPD, MI EXAM: CHEST  2 VIEW COMPARISON:  07/06/2016 FINDINGS: Normal heart size, mediastinal contours, and pulmonary vascularity. Emphysematous changes consistent with COPD. Bibasilar scarring greater on LEFT. No acute infiltrate, pleural effusion or pneumothorax. Bones appear demineralized. IMPRESSION: COPD changes with LEFT basilar scarring. No acute abnormalities. Emphysema (ICD10-J43.9). Electronically Signed   By: Lavonia Dana M.D.   On: 09/17/2016 15:22    Procedures Procedures (including critical care time)  Medications Ordered in ED Medications  aspirin chewable tablet 324 mg (not administered)     Initial Impression / Assessment and Plan / ED Course  I have reviewed the triage vital signs and the nursing notes.  Pertinent labs & imaging results that were available during my care of the patient were reviewed by me and considered in my medical decision making (see chart for details).    Patient presents with hypertension, tachycardia, chest pressure and mild shortness of breath. Patient denies classic blood clot risk factors however with persistent mild tachycardia plan for d-dimer. Patient denies cardiac history although in his chart heart attack as listed. With significant chest pressure the past couple days plan for telemetry observation for further evaluation. Aspirin ordered.  The patients results and plan were reviewed and discussed.   Any x-rays performed were independently reviewed by myself.   Differential diagnosis were considered with the presenting HPI.  Medications  aspirin chewable tablet 324 mg (not  administered)    Vitals:   09/17/16 1441 09/17/16 1442  BP: (!) 124/94   Pulse: (!) 115   Resp: 20   Temp: 98.8 F (37.1 C)   TempSrc: Oral   SpO2: 93%   Weight:  (!) 142.9 kg (315 lb)  Height:  6\' 2"  (1.88 m)    Final diagnoses:  Acute chest pain    Admission/ observation were discussed with the admitting physician, patient and/or family and they are comfortable with the plan.  Final Clinical Impressions(s) / ED Diagnoses   Final diagnoses:  Acute chest pain    New Prescriptions New Prescriptions   No medications on file     Elnora Morrison, MD 09/21/16 1504

## 2016-09-17 NOTE — ED Notes (Addendum)
Patient transported to X-ray from Triage. X-ray stated they will bring pt back to room.

## 2016-09-17 NOTE — H&P (Signed)
[facility] Admission History and Physical Service Pager: [phone]  Patient name: Mario [name] Medical record number: [id number] Date of birth: Apr 18, 1950 Age: 66 y.o. Gender: male  Primary Care Provider: [facility] Consultants: None Code Status: FULL   Chief Complaint: chest pressure   Assessment and Plan: Mario [name] is a 66 y.o. male presenting with chest pain/pressure x 3 days . PMH is significant for HTN, obesity, tobacco use, h/o old MI (documented in problem list however pt denies any cardiac hx), COPD, h/o vertigo, anxiety and depression.    Chest pain, r/o ACS.   With 3 days of chest pain and pressure that has not subsided.  Pt notes he was seen in [place] ED yesterday for same problem.  Unable to see records however per patient and his roommate, enzyme level was ordered (likely troponin) and was found to be elevated.  He was advised to take Lisinopril to help with his blood pressures and follow up with primary doctor.  On arrival to ED, pt with elevated BP to 016 systolic and tachycardic to low 100s.  Afebrile with mildly elevated white count of 13.6.  Labs notable for mild hyperkalemia to 5.3, and normal BNP of 12.3.  CXR with COPD changes and L basilar scarring but no acute abnormalities.  Given body habitus, associated SOB with CP, and likely decreased mobility at home, concern for PE.  D-dimer mildly elevated to 0.81.  CTA ordered in ED which was negative for PE and revealed centrilobular emphysema with thick walled cavitation or bulla at the left lung base which may reflect prior infection -- neoplasm less likely in absence of ancillary findings however recommend repeat chest CT in 3 months.  EKG with sinus tachycardia and initial troponin 0.00.  In ED, given Aspirin 325 and 1g Tylenol which resolved his chest pressure. Symptoms largely resolved by time of my exam and chest pain not reproducible with palpation so unlikely MSK in nature.   Unlikely CHF as BNP within normal limits, no crackles appreciated on lung exam and no obvious signs of fluid overload such as leg swelling.  He does not have a prior echo but notes he did have a stress test about 4 years ago.  He is unsure of the results of this.  Will admit for rule out given his significant comorbidities and typical signs/symptoms concerning for ACS.  -Admit to telemetry - observation, attending Dr. Gwendlyn Deutscher -cont cardiac monitoring  -Trend serial troponins -continue daily ASA 81 mg  -SL nitro prn  -AM EKG -ECHO -would recommend stress test inpatient if possible, vs outpatient  -AM CBC, BMET  -Risk stratification labs: TSH, A1c, lipid panel -Tylenol 650 mg Q6 PRN  -Zofran 4 mg Q6 PRN  -continuous pulse ox -supplemental O2 as needed to maintain sats >92%  -vitals per unit routine   H/o MI.  Is documented on his problem list however pt denies any cardiac history or workup.  On admission when asked, he notes he did have a stress test about 4 years ago but unsure of the results.  Very rarely has episodes of angina which occurred more frequently when he was smoking often.  Last anginal episode was about 3 years ago and he has felt well in the meantime.  Take a daily ASA 81 mg at home and was started on a statin medication however was told his lipid panel was wnl and that he did not need to continue this.   -Continue daily aspirin 81 mg -Echo  as above -consider stress test while inpatient if possible vs set up outpatient   Hyperkalemia.  On admission with mild hyperkalemia to 5.3.  EKG without peaked T waves.  -AM EKG  -AM BMET  -Continue to monitor   HTN.  On admission with BP 151/99 however BPs have improved while in ED with most recent 136/85.   At home on Lisinopril 20-40 mg.  Reports good compliance with medications and checks blood pressures at home. Has recently doubled his home Lisinopril due to high readings.  -continue home Lisinopril; consider adding another agent if  needed  -Continue to monitor bp  HLD.  Last lipid panel 2016 with LDL 174, HDL 30, total chol 230, TG 131.  Lipitor 20 mg on home med list however pt reports he is no longer taking it due to jittery feeling with associated nausea and vomiting.  Of note, he states he was told his lipid profile was wnl and that he did not need to be on a statin medication.   -Consider restarting statin medication vs alternate day dosing vs Zetia -recheck lipid panel  COPD.  At home on Spiriva, and albuterol prn.  Long history of tobacco use.  Recently quit smoking.  Used to smoke 1-1/2 PPD x 30 years.   -Continue home Spiriva 18 mcg daily -albuterol 2 puffs Q6 PRN  -combivent Q6 PRN   Chronic pain.  Long-standing history of back, neck, and knee pain. Takes Norco/Vicodin 5-325 2 tabs BID prn at home, as well as Prednisone 5 mg daily from rehab for his knee x the past 1 month and MTX 15 mg once a week.  Additionally taking Gabapentin 900 mg TID.  -Follows with [facility] home meds  -Continue to monitor pain   Vertigo.  Pt used to take Antivert but states he no longer needs to as he no longer experiences symptoms.   -Will continue to monitor   Depression.  Stable. At home on Wellbutrin 300 mg and Cymbalta 30-60 mg.  H/o suicide attempt is documented in his chart.  Currently denies SI/HI and notes he is doing well on his medications.  -Continue home Wellbutrin -Continue home Cymbalta   Anxiety.  Stable.  Takes Latuda at home.  -Continue home medication    FEN/GI: SLIV, heart healthy diet  Prophylaxis: Lovenox   Disposition: Admit to tele-obs, attending Dr. Gwendlyn Deutscher   History of Present Illness:  Mario [name] is a 66 y.o. male presenting with HTN and chest pressure x 3 days.  Began last Thursday when he was playing video games.  Occurred at rest while laying down. Notes his chest felt heavy and tight like elephants sitting on it.  Laid down to take a nap but chest tightness did not go  away.  Took deep breaths to see if it would help and it did not.  Did not take any Nitro or aspirin at home. He recently quit smoking in April.  Sometimes has episodes of this where it goes away 20 min to 2 hours later however notes he has not had an episode in a while.  Denies any cardiac history.  Endorses episodes in the past of heart pounding.  Over last 3 days has also had some blurred vision.  Denies lightheadedness and dizziness.  Endorses some SOB associated with the CP but no palpitations or diaphoresis.  He reports good compliance with home medications and checks his blood pressures frequently at home.  BP was 151/109 at 4 AM this  morning.  Sometimes he has systolics to the 734L. Has recently doubled his home lisinopril to better control blood pressures.    He was seen at [facility] ED yesterday. I am unable to see records of this in his chart however per pt, he had an elevated enzyme level, likely his troponin, and was advised to increase his lisinopril dose and follow up with primary doctor.  Although he denies cardiac history, he notes he did have a stress test about 4 years ago and does not recall the results.  He does not think he has ever had an echocardiogram.  Follows with PCP in [place].    Review Of Systems: Per HPI with the following additions:   Review of Systems  Constitutional: Negative for chills and fever.  HENT: Negative for congestion and sore throat.   Eyes: Positive for blurred vision.  Respiratory: Positive for shortness of breath. Negative for cough and wheezing.   Cardiovascular: Positive for chest pain. Negative for palpitations and leg swelling.  Gastrointestinal: Negative for abdominal pain, nausea and vomiting.  Genitourinary: Negative for dysuria.  Musculoskeletal: Positive for back pain and joint pain.  Neurological: Negative for dizziness, weakness and headaches.   Patient Active Problem List   Diagnosis Date Noted  . Spontaneous pneumothorax 06/27/2016  .  Hypoxia   . Pneumothorax on left   . Dyspnea 05/23/2014  . COPD exacerbation ([place]) 05/23/2014  . Malingering 01/21/2013  . MDD (major depressive disorder), recurrent severe, without psychosis ([place]) 01/13/2013  . Depression 01/11/2013  . Tobacco abuse 01/11/2013  . Obesity, unspecified 01/11/2013  . Old MI (myocardial infarction) 01/11/2013  . Chest pain 01/10/2013  . HTN (hypertension) 01/10/2013   Past Medical History: Past Medical History:  Diagnosis Date  . Anxiety   . Bronchitis   . COPD (chronic obstructive pulmonary disease) ([place])   . Depression   . Hypertension   . Mental disorder   . MI (myocardial infarction) ([place])    ????  . OSA (obstructive sleep apnea)   . Suicide attempt ([place])   . Tension pneumothorax 06/27/2016   Past Surgical History: Past Surgical History:  Procedure Laterality Date  . CHEST TUBE INSERTION Left 06/27/2016  . cryptorchidism    . SKIN CANCER EXCISION     Social History: Social History  Substance Use Topics  . Smoking status: Current Every Day Smoker    Packs/day: 0.50    Years: 20.00    Types: Cigarettes  . Smokeless tobacco: Never Used  . Alcohol use No     Comment: denies use of any drugs or alcohol   Additional social history: Lives at home in a duplex with his roommate.  Former smoker, recently quit.  Used to smoke 1-1.5 PPD x 30 years, rare alcohol use.  Denies drug use.   Please also refer to relevant sections of EMR.  Family History: Family History  Problem Relation Age of Onset  . Dementia Father   No known cardiac history in family.   Allergies and Medications: Allergies  Allergen Reactions  . Demerol [Meperidine] Nausea And Vomiting    Violently sick  . Zocor [Simvastatin] Other (See Comments)    Made him very jittery, nausea and vomiting   No current facility-administered medications on file prior to encounter.    Current Outpatient Prescriptions on File Prior to Encounter  Medication Sig Dispense Refill  .  diclofenac (VOLTAREN) 75 MG EC tablet Take 75 mg by mouth 2 (two) times daily.    . DULoxetine (  CYMBALTA) 30 MG capsule Take 1 capsule (30 mg total) by mouth daily. (Patient taking differently: Take 30-60 mg by mouth See admin instructions. Take 2 capsules (60 mg) by mouth every morning and 1 capsule (30 mg) at bedtime) 30 capsule 0  . folic acid (FOLVITE) 1 MG tablet Take 1 mg by mouth daily.    [name] gabapentin (NEURONTIN) 300 MG capsule Take 2 capsules (600 mg total) by mouth 2 (two) times daily. (Patient taking differently: Take 900 mg by mouth 3 (three) times daily. ) 60 capsule 1  . Hypromellose (ARTIFICIAL TEARS OP) Place 1 drop into both eyes daily as needed (dry eyes).    . Ipratropium-Albuterol (COMBIVENT RESPIMAT) 20-100 MCG/ACT AERS respimat Inhale 1-2 puffs into the lungs every 6 (six) hours as needed for wheezing.     [name] lisinopril (PRINIVIL,ZESTRIL) 20 MG tablet Take 1 tablet (20 mg total) by mouth daily. For high blood pressure (Patient taking differently: Take 20-40 mg by mouth See admin instructions. Take 1 tablet (20 mg) by mouth daily (per [place] doctor take 1 tablet (20 mg) twice daily (early am and at breakfast) on 7/20, 7/21 and 7/22 and revisit PCP on 7/23) 30 tablet 0  . methotrexate (RHEUMATREX) 2.5 MG tablet Take 15 mg by mouth every Saturday. Caution:Chemotherapy. Protect from light.     . mirtazapine (REMERON) 15 MG tablet Take 15 mg by mouth at bedtime.    [name] oxyCODONE 10 MG TABS Take 10 mg by mouth every 4-6 hours PRN severe pain. 28 tablet 0  . sennosides-docusate sodium (SENOKOT-S) 8.6-50 MG tablet Take 1 tablet by mouth daily.     . sodium chloride (OCEAN) 0.65 % SOLN nasal spray Place 2 sprays into both nostrils 4 (four) times daily as needed for congestion.    [name] tiotropium (SPIRIVA) 18 MCG inhalation capsule Place 1 capsule (18 mcg total) into inhaler and inhale daily. 30 capsule 12  . ARIPiprazole (ABILIFY) 5 MG tablet Take 1 tablet (5 mg total) by mouth daily. For depression  and anxiety. (Patient not taking: Reported on 06/27/2016) 30 tablet 0  . atorvastatin (LIPITOR) 20 MG tablet Take 1 tablet (20 mg total) by mouth daily. (Patient not taking: Reported on 07/21/2014) 30 tablet 0  . meclizine (ANTIVERT) 25 MG tablet Take 1 tablet (25 mg total) by mouth 3 (three) times daily as needed for dizziness. (Patient not taking: Reported on 06/27/2016) 30 tablet 0  . pantoprazole (PROTONIX) 40 MG tablet Take 1 tablet (40 mg total) by mouth daily. (Patient not taking: Reported on 07/21/2014) 30 tablet 0   Objective: BP (!) 131/91   Pulse 94   Temp 98.8 F (37.1 C) (Oral)   Resp 17   Ht 6\' 2"  (1.88 m)   Wt (!) 315 lb (142.9 kg)   SpO2 94%   BMI 40.44 kg/m    Exam: General: pleasant 66 yo M, lying in hospital bed, in no acute distress Eyes: EOMI, PERRL ENTM: MMM, o/p clear Neck: supple, normal ROM  Cardiovascular: RRR no MRG, palpable pulses present  Respiratory: CTAB, WOB is comfortable on RA  Gastrointestinal: soft, NTND, no masses palpated, +bs  MSK: normal ROM  Derm: warm, dry, no rashes  Neuro: alert, oriented x 4, normal tone, 5/5 strength bilaterally in upper and lower ext, sensation intact  Psych: normal mood and affect   Labs and Imaging: CBC BMET   Recent Labs Lab 09/17/16 1450  WBC 13.6*  HGB 15.0  HCT 46.2  PLT 262  Recent Labs Lab 09/17/16 1450  NA 135  K 5.3*  CL 99*  CO2 26  BUN 14  CREATININE 1.23  GLUCOSE 121*  CALCIUM 9.2     Dg Chest 2 View  Result Date: 09/17/2016 CLINICAL DATA:  Elevated blood pressure for 3 days, lightheaded, history hypertension, COPD, MI EXAM: CHEST  2 VIEW COMPARISON:  07/06/2016 FINDINGS: Normal heart size, mediastinal contours, and pulmonary vascularity. Emphysematous changes consistent with COPD. Bibasilar scarring greater on LEFT. No acute infiltrate, pleural effusion or pneumothorax. Bones appear demineralized. IMPRESSION: COPD changes with LEFT basilar scarring. No acute abnormalities. Emphysema  (ICD10-J43.9). Electronically Signed   By: Lavonia Dana M.D.   On: 09/17/2016 15:22   Ct Angio Chest Pe W And/or Wo Contrast  Result Date: 09/17/2016 CLINICAL DATA:  Hypertension, chest pain and blurry vision with dizziness x3 days EXAM: CT ANGIOGRAPHY CHEST WITH CONTRAST TECHNIQUE: Multidetector CT imaging of the chest was performed using the standard protocol during bolus administration of intravenous contrast. Multiplanar CT image reconstructions and MIPs were obtained to evaluate the vascular anatomy. CONTRAST:  100 cc Isovue 370 IV COMPARISON:  CXR from earlier on the same day. FINDINGS: Cardiovascular: The study is of quality for the evaluation of pulmonary embolism. There are no filling defects in the central, lobar, segmental or subsegmental pulmonary artery branches to suggest acute pulmonary embolism. Great vessels are normal in course and caliber. Normal heart size. No significant pericardial fluid/thickening. Anatomic variant left intercostal vein seen coursing along the descending thoracic aorta and arch emptying into the left brachiocephalic vein. Minimal calcific aortic atherosclerosis. Mediastinum/Nodes: No discrete thyroid nodules. Unremarkable esophagus. No pathologically enlarged axillary, mediastinal or hilar lymph nodes. Lungs/Pleura: No pneumothorax. No pleural effusion. Centrilobular emphysema bilaterally with atelectasis at each lung base and lingula. A thick-walled cavitation with single wall thickness of 9 mm is seen at the left lung base involving the left lower lobe. It measures 2 cm in diameter. Findings are nonspecific but may represent a thick-walled pole possibly from prior infection. Malignancy is not excluded but believed less likely. Upper abdomen: Unremarkable. Musculoskeletal: No aggressive appearing focal osseous lesions. Diffuse idiopathic skeletal hyperostosis with thoracolumbar spondylosis is noted. Review of the MIP images confirms the above findings. IMPRESSION: 1.  Centrilobular emphysema. Thick-walled cavitation or bulla at the left lung base may reflect prior infection. Neoplasm believed less likely in the absence ancillary findings. Short term interval follow-up is suggested to assure stability Consider one of the following in 3 months for both low-risk and high-risk individuals: (a) repeat chest CT, (b) follow-up PET-CT, or (c) tissue sampling. This recommendation follows the consensus statement: Guidelines for Management of Incidental Pulmonary Nodules Detected on CT Images: From the Fleischner Society 2017; Radiology 2017; 284:228-243. 2. No acute pulmonary embolus. Aortic Atherosclerosis (ICD10-I70.0) and Emphysema (ICD10-J43.9). Electronically Signed   By: Ashley Royalty M.D.   On: 09/17/2016 19:17    Lovenia Kim, MD 09/17/2016, 10:20 PM PGY-2, [facility] pager: [phone], text pages welcome

## 2016-09-17 NOTE — ED Notes (Signed)
Pt denies CP

## 2016-09-17 NOTE — ED Triage Notes (Signed)
Pt c/o high blood pressure and chest pain ongoing since Thursday. Pt reports that he "doubled up" on his blood pressure medications yesterday and day.

## 2016-09-17 NOTE — ED Notes (Signed)
Pt c/o blurred vision but states this has been ongoing for months. Pt c/o headache and lightheadedness.

## 2016-09-18 ENCOUNTER — Other Ambulatory Visit: Payer: Self-pay

## 2016-09-18 ENCOUNTER — Observation Stay (HOSPITAL_BASED_OUTPATIENT_CLINIC_OR_DEPARTMENT_OTHER): Payer: Non-veteran care

## 2016-09-18 DIAGNOSIS — R079 Chest pain, unspecified: Secondary | ICD-10-CM

## 2016-09-18 DIAGNOSIS — R072 Precordial pain: Secondary | ICD-10-CM

## 2016-09-18 DIAGNOSIS — E875 Hyperkalemia: Secondary | ICD-10-CM

## 2016-09-18 DIAGNOSIS — Z720 Tobacco use: Secondary | ICD-10-CM | POA: Diagnosis not present

## 2016-09-18 DIAGNOSIS — F332 Major depressive disorder, recurrent severe without psychotic features: Secondary | ICD-10-CM | POA: Diagnosis not present

## 2016-09-18 DIAGNOSIS — I10 Essential (primary) hypertension: Secondary | ICD-10-CM | POA: Diagnosis not present

## 2016-09-18 DIAGNOSIS — F419 Anxiety disorder, unspecified: Secondary | ICD-10-CM | POA: Diagnosis not present

## 2016-09-18 LAB — BASIC METABOLIC PANEL
Anion gap: 7 (ref 5–15)
BUN: 16 mg/dL (ref 6–20)
CO2: 25 mmol/L (ref 22–32)
Calcium: 8.7 mg/dL — ABNORMAL LOW (ref 8.9–10.3)
Chloride: 106 mmol/L (ref 101–111)
Creatinine, Ser: 1.01 mg/dL (ref 0.61–1.24)
GFR calc Af Amer: >60 mL/min (ref >60)
GFR calc non Af Amer: >60 mL/min (ref >60)
Glucose, Bld: 95 mg/dL (ref 65–99)
Potassium: 4.6 mmol/L (ref 3.5–5.1)
Sodium: 138 mmol/L (ref 135–145)

## 2016-09-18 LAB — CBC
HCT: 42.1 % (ref 39.0–52.0)
Hemoglobin: 13.3 g/dL (ref 13.0–17.0)
MCH: 31.1 pg (ref 26.0–34.0)
MCHC: 31.6 g/dL (ref 30.0–36.0)
MCV: 98.6 fL (ref 78.0–100.0)
Platelets: 229 10*3/uL (ref 150–400)
RBC: 4.27 MIL/uL (ref 4.22–5.81)
RDW: 16 % — ABNORMAL HIGH (ref 11.5–15.5)
WBC: 10.2 10*3/uL (ref 4.0–10.5)

## 2016-09-18 LAB — TROPONIN I: Troponin I: <0.03 ng/mL (ref <0.03)

## 2016-09-18 MED ORDER — METOPROLOL TARTRATE 12.5 MG HALF TABLET
12.5000 mg | ORAL_TABLET | Freq: Two times a day (BID) | ORAL | Status: DC
  Administered 2016-09-18: 12.5 mg via ORAL
  Filled 2016-09-18: qty 1

## 2016-09-18 MED ORDER — ROSUVASTATIN CALCIUM 10 MG PO TABS: 5.0000 mg | ORAL_TABLET | Freq: Every day | ORAL | Status: DC

## 2016-09-18 NOTE — Care Management CC44 (Signed)
Condition Code 44 Documentation Completed  Patient Details  Name: Cailean Heacock MRN: [id number] Date of Birth: 01/03/1951   Condition Code 44 given:  Yes Patient signature on Condition Code 44 notice:  Yes Documentation of 2 MD's agreement:  Yes Code 44 added to claim:  Yes    Delrae Sawyers, RN 09/18/2016, 2:35 PM

## 2016-09-18 NOTE — Progress Notes (Signed)
Spoke with patient at approximately 1600 regarding his desire to be discharged. Discussed that his echo had not yet been read, and we needed the interpretation prior to his discharge. Explained need for interpretation and cardiology's agreement with this plan. Specifically discussed that if there were abnormal findings on echo, we would need to keep him inpatient for further cardiology evaluation and possibly more urgent treatment, and that outpatient stress test alone was only the plan if echo was completely normal. Patient amenable to waiting a few more hours for read, but said he would be leaving AMA after dinner regardless of echo status.   Received page from patient's RN at 1748 stating that patient leaving AMA. Immediately went to speak with patient but he had already left.   Adin Hector, MD, MPH PGY-3 [facility] Pager [phone]

## 2016-09-18 NOTE — Care Management Obs Status (Signed)
[facility] NOTIFICATION   Patient Details  Name: Mario [name] MRN: [id number] Date of Birth: 08-09-1950   Medicare Observation Status Notification Given:  Yes    CrutchfieldAntony Haste, RN 09/18/2016, 2:34 PM

## 2016-09-18 NOTE — Progress Notes (Signed)
Patient placed on CPAP for the night without complications. RT will continue to monitor as needed.

## 2016-09-18 NOTE — Consult Note (Signed)
Cardiology Consult    Patient ID: Maxum Cassarino MRN: [id number], DOB/AGE: 1950/08/24   Admit date: 09/17/2016 Date of Consult: 09/18/2016  Primary Physician: [facility] Primary Cardiologist: None Requesting Provider: Dr. Gwendlyn Deutscher Reason for Consultation: Chest pain  Luby Seamans is a 66 y.o. male who is being seen today for the evaluation of chest pain at the request of Dr. Gwendlyn Deutscher.   Patient Profile    66 year old male with past medical history of hypertension, obesity, tobacco use, anxiety/depression and COPD who presented with chest pain.  Past Medical History   Past Medical History:  Diagnosis Date  . Anxiety   . Bronchitis   . COPD (chronic obstructive pulmonary disease) ([place])   . Depression   . Hypertension   . Mental disorder   . MI (myocardial infarction) ([place])    ????  . OSA (obstructive sleep apnea)   . Suicide attempt ([place])   . Tension pneumothorax 06/27/2016    Past Surgical History:  Procedure Laterality Date  . CHEST TUBE INSERTION Left 06/27/2016  . cryptorchidism    . SKIN CANCER EXCISION       Allergies  Allergies  Allergen Reactions  . Demerol [Meperidine] Nausea And Vomiting    Violently sick  . Zocor [Simvastatin] Other (See Comments)    Made him very jittery, nausea and vomiting    History of Present Illness    Mr. Orsak is a 66 year old male with past medical history of hypertension, obesity, tobacco use, anxiety/depression and COPD. Reports he is currently followed through the [place]. Underwent stress testing back in 2014 which showed fixed defect along the inferior wall with associated hypokinesis but no reversible ischemia, and EF of 48%. States he did have a cardiac cath about 3 years ago done at the [place], but per his report no PCI or stenting done. States he is in his usual state of health up until Thursday evening. Noted that his blood pressure was significantly elevated with readings noting the systolic 073X. Reports he is  very compliant with his blood pressure medications, and was taking extra doses of medicine to try to improve his blood pressure. Also developed some left-sided chest tightness with this hypertension. States this subsided on its own, but his blood pressure remained persistently elevated. He went to the ED in [place], and was instructed that one of his labs was abnormal, in that his blood pressure was elevated. Was advised to take lisinopril to help lower his blood pressures and follow-up with his primary care doctor.  Reports attempting to increase his medication, but blood pressures continued to be elevated. Presented to the ED on 7/21 with reported symptoms. In the ED his labs showed potassium 5.3, other electrolytes stable, troponin negative 3, hemoglobin 15, d-dimer 0.81. EKG showed sinus tachycardia, rate 110s with no acute ST/T-wave abnormality. Blood pressure stable on admission. CTA was negative for PE, but noted aortic calcifications. Chest x-ray was stable COPD. Of note he reports quitting smoking back in April. No further episodes of chest pressure noted this admission.  Inpatient Medications    . aspirin EC  81 mg Oral Daily  . buPROPion  300 mg Oral Daily  . DULoxetine  60 mg Oral Daily   And  . DULoxetine  30 mg Oral QHS  . enoxaparin (LOVENOX) injection  40 mg Subcutaneous Q24H  . gabapentin  900 mg Oral TID  . lisinopril  40 mg Oral Daily  . lurasidone  20 mg Oral QHS  . methotrexate  15  mg Oral Q Sat  . predniSONE  5 mg Oral Q breakfast  . rosuvastatin  5 mg Oral q1800  . tiotropium  18 mcg Inhalation Daily    Family History    Family History  Problem Relation Age of Onset  . Dementia Father     Social History    Social History   Social History  . Marital status: Single    Spouse name: N/A  . Number of children: N/A  . Years of education: N/A   Occupational History  . Not on file.   Social History Main Topics  . Smoking status: Current Every Day Smoker     Packs/day: 0.50    Years: 20.00    Types: Cigarettes  . Smokeless tobacco: Never Used  . Alcohol use No     Comment: denies use of any drugs or alcohol  . Drug use: No  . Sexual activity: Not on file   Other Topics Concern  . Not on file   Social History Narrative  . No narrative on file     Review of Systems    See HPI  All other systems reviewed and are otherwise negative except as noted above.  Physical Exam    Blood pressure 131/77, pulse 97, temperature 98.7 F (37.1 C), temperature source Oral, resp. rate 20, height 6\' 2"  (1.88 m), weight (!) 312 lb (141.5 kg), SpO2 93 %.  General: Pleasant,Obese white male NAD Psych: Normal affect. Neuro: Alert and oriented X 3. Moves all extremities spontaneously. HEENT: Normal  Neck: Supple without bruits or JVD. Lungs:  Resp regular and unlabored, CTA. Heart: RRR no s3, s4, or murmurs. Abdomen: Soft, non-tender, non-distended, BS + x 4.  Extremities: No clubbing, cyanosis, mild bilateral lower extremity edema. DP/PT/Radials 2+ and equal bilaterally.  Labs    Troponin [facility] Test)  Recent Labs  09/17/16 1454  TROPIPOC 0.00    Recent Labs  09/17/16 2238 09/18/16 0243  TROPONINI <0.03 <0.03   Lab Results  Component Value Date   WBC 10.2 09/18/2016   HGB 13.3 09/18/2016   HCT 42.1 09/18/2016   MCV 98.6 09/18/2016   PLT 229 09/18/2016     Recent Labs Lab 09/18/16 0243  NA 138  K 4.6  CL 106  CO2 25  BUN 16  CREATININE 1.01  CALCIUM 8.7*  GLUCOSE 95   Lab Results  Component Value Date   CHOL 184 09/17/2016   HDL 42 09/17/2016   LDLCALC 103 (H) 09/17/2016   TRIG 197 (H) 09/17/2016   Lab Results  Component Value Date   DDIMER 0.81 (H) 09/17/2016     Radiology Studies    Dg Chest 2 View  Result Date: 09/17/2016 CLINICAL DATA:  Elevated blood pressure for 3 days, lightheaded, history hypertension, COPD, MI EXAM: CHEST  2 VIEW COMPARISON:  07/06/2016 FINDINGS: Normal heart size,  mediastinal contours, and pulmonary vascularity. Emphysematous changes consistent with COPD. Bibasilar scarring greater on LEFT. No acute infiltrate, pleural effusion or pneumothorax. Bones appear demineralized. IMPRESSION: COPD changes with LEFT basilar scarring. No acute abnormalities. Emphysema (ICD10-J43.9). Electronically Signed   By: Lavonia Dana M.D.   On: 09/17/2016 15:22   Ct Angio Chest Pe W And/or Wo Contrast  Result Date: 09/17/2016 CLINICAL DATA:  Hypertension, chest pain and blurry vision with dizziness x3 days EXAM: CT ANGIOGRAPHY CHEST WITH CONTRAST TECHNIQUE: Multidetector CT imaging of the chest was performed using the standard protocol during bolus administration of intravenous contrast. Multiplanar CT  image reconstructions and MIPs were obtained to evaluate the vascular anatomy. CONTRAST:  100 cc Isovue 370 IV COMPARISON:  CXR from earlier on the same day. FINDINGS: Cardiovascular: The study is of quality for the evaluation of pulmonary embolism. There are no filling defects in the central, lobar, segmental or subsegmental pulmonary artery branches to suggest acute pulmonary embolism. Great vessels are normal in course and caliber. Normal heart size. No significant pericardial fluid/thickening. Anatomic variant left intercostal vein seen coursing along the descending thoracic aorta and arch emptying into the left brachiocephalic vein. Minimal calcific aortic atherosclerosis. Mediastinum/Nodes: No discrete thyroid nodules. Unremarkable esophagus. No pathologically enlarged axillary, mediastinal or hilar lymph nodes. Lungs/Pleura: No pneumothorax. No pleural effusion. Centrilobular emphysema bilaterally with atelectasis at each lung base and lingula. A thick-walled cavitation with single wall thickness of 9 mm is seen at the left lung base involving the left lower lobe. It measures 2 cm in diameter. Findings are nonspecific but may represent a thick-walled pole possibly from prior infection.  Malignancy is not excluded but believed less likely. Upper abdomen: Unremarkable. Musculoskeletal: No aggressive appearing focal osseous lesions. Diffuse idiopathic skeletal hyperostosis with thoracolumbar spondylosis is noted. Review of the MIP images confirms the above findings. IMPRESSION: 1. Centrilobular emphysema. Thick-walled cavitation or bulla at the left lung base may reflect prior infection. Neoplasm believed less likely in the absence ancillary findings. Short term interval follow-up is suggested to assure stability Consider one of the following in 3 months for both low-risk and high-risk individuals: (a) repeat chest CT, (b) follow-up PET-CT, or (c) tissue sampling. This recommendation follows the consensus statement: Guidelines for Management of Incidental Pulmonary Nodules Detected on CT Images: From the Fleischner Society 2017; Radiology 2017; 284:228-243. 2. No acute pulmonary embolus. Aortic Atherosclerosis (ICD10-I70.0) and Emphysema (ICD10-J43.9). Electronically Signed   By: Ashley Royalty M.D.   On: 09/17/2016 19:17    ECG & Cardiac Imaging    EKG: Sinus tachycardia without acute ST/T-wave abnormalities  Echo: Pending  Stress Test: 11/14  FINDINGS: A remote inferior wall infarct is present. There is a matching defect at rest and stress. Moderate hypokinesis is present along the inferior wall. Remainder of the heart demonstrates normal perfusion and contractility.  The calculated ejection fraction is 48% with no estimated diastolic volume of 938 mL and end estimated systolic volume of 82 mL.  IMPRESSION: 1. 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 2. No evidence for acute ischemia or reversible perfusion. 3. Otherwise normal contractility. 4. Low normal cardiac function with an estimated ejection fraction of 48%.  Assessment & Plan    66 year old male with past medical history of hypertension, obesity, tobacco  use, anxiety/depression and COPD who presented with chest pain.  1. Chest pressure: Reports developing left-sided chest pressure in the setting of hypertension for the past couple of days. Presented to outside hospital and was instructed that his enzymes were elevated, but unable to obtain records to verify this. Troponins negative 2, EKG without acute findings. Underwent stress test in 2014 which showed defect along the inferior wall with associated hypokinesis. States he underwent cardiac cath several years ago to the [place] but no PCI or stenting done per patient report. No further episodes of chest tightness this admission. -- Discussed the option of stress test with patient, but he prefers to follow with the [place]. Given that he has had no further episodes of chest pressure, negative troponins and nonacute EKG seems reasonable to allow this to  be done outpatient to the [facility] per patient request given no acute findings on echo. --  Echo pending  2. HTN: Reports his blood pressures were uncontrolled prior to admission. Lisinopril recently increased prior to this admission without improvement per his report. He has been noted to be somewhat tachycardic this admission.  -- May benefit from low-dose beta blocker.  3. HL: LDL 103, on Crestor 5 mg daily. Noted to be intolerant to Zocor in the past.   4. History of tobacco use: States he quit back in April.  5. Hyperkalemia: Resolved   Signed, Luby Seamans, NP-C Pager [phone] 09/18/2016, 11:25 AM

## 2016-09-18 NOTE — Progress Notes (Signed)
Pt has decided to leave AMA. Risks to leaving AMA explained to pt in full detail including possibility for sudden death. Pt is AOx4 and verbalized understanding but still wishes to sign AMA. AMA papers signed. IVs removed. Telemetry removed. Pt walked off floor.

## 2016-09-18 NOTE — Progress Notes (Signed)
2D Echocardiogram has been performed.  Mario [name] 09/18/2016, 3:48 PM

## 2016-09-18 NOTE — Discharge Summary (Signed)
[facility] Discharge Summary  Patient name: Mario [name] Medical record number: [id number] Date of birth: 02-Nov-1950 Age: 66 y.o. Gender: male Date of Admission: 09/17/2016  Date of Discharge: PATIENT LEFT AMA 07/22 Admitting Physician: Kinnie Feil, MD  Primary Care Provider: [facility] Consultants: cardiology  Indication for Hospitalization: chest pain  Discharge Diagnoses/Problem List:  Patient Active Problem List   Diagnosis Date Noted  . Acute chest pain   . Hyperkalemia   . Spontaneous pneumothorax 06/27/2016  . Hypoxia   . Pneumothorax on left   . Dyspnea 05/23/2014  . COPD exacerbation ([place]) 05/23/2014  . Malingering 01/21/2013  . MDD (major depressive disorder), recurrent severe, without psychosis ([place]) 01/13/2013  . Depression 01/11/2013  . Tobacco abuse 01/11/2013  . Obesity, unspecified 01/11/2013  . Chest pain 01/10/2013  . HTN (hypertension) 01/10/2013     Disposition: LEFT AMA  Discharge Condition: Improved  Discharge Exam:  General: pleasant gentleman sitting up on side of bed in NAD Cardiovascular: RRR no MRG Respiratory: CTAB, no increased WOB Abdomen: obese abdomen, soft, NTND Extremities: no edema or cyanosis noted  Brief Hospital Course:  Patient presented with chest pain occurring at rest. He had already been seen for this at the [facility] ED the day prior and was told he had an elevated enzyme level, and was advised to increase his lisinopril dose and f/u with his PCP at [facility]. Patient's chest pain persisted, which is why he presented to [facility] the following day. Also found to have elevated BP to 151/109. Given risk factors of obesity, SOB associated with CP, and HTN, he was admitted for ACS rule out, and also out of concern for possible PE, as D-dimer was elevated.  CTA was performed which was negative for PE. EKG showed sinus tach. Trop were trended and were negative x3. Chest pain resolved in ED.  Cardiology was consulted given patient's risk factors, and recommended echo with outpatient stress test at [facility] if echo results normal. Echo was performed, however patient chose to leave AMA prior to echo read. Discussed with patient the risks of leaving before echo interpretation resulted (please see interim progress note from same date for further details). Patient voiced understanding but chose to leave regardless.   Issues for Follow Up:  1. Follow up on echo read.  2. Patient to schedule outpatient stress test at [facility].  3. Patient was started on metoprolol and statin during admission, however as he left AMA these were not prescribed.   Significant Procedures: None  Significant Labs and Imaging:   Recent Labs Lab 09/17/16 1450 09/18/16 0243  WBC 13.6* 10.2  HGB 15.0 13.3  HCT 46.2 42.1  PLT 262 229    Recent Labs Lab 09/17/16 1450 09/18/16 0243  NA 135 138  K 5.3* 4.6  CL 99* 106  CO2 26 25  GLUCOSE 121* 95  BUN 14 16  CREATININE 1.23 1.01  CALCIUM 9.2 8.7*     Results/Tests Pending: Echo, A1C    Verner Mould, MD 09/18/2016, 6:05 PM PGY-3, [place]

## 2016-09-18 NOTE — Progress Notes (Signed)
Family Medicine Teaching Service Daily Progress Note Intern Pager: [phone]  Patient name: Mario [name] Medical record number: [id number] Date of birth: 1951-01-18 Age: 66 y.o. Gender: male  Primary Care Provider: [facility] Consultants: cards Code Status: full  Pt Overview and Major Events to Date:  7/21- admitted to [facility] for ACS r/o  Assessment and Plan: Chest pain, r/o ACS.   With 3 days of chest pain and pressure that has not subsided. D-dimer mildly elevated to 0.81. CTA ordered in ED which was negative for PE. EKG with sinus tachycardia and initial troponin 0.00. Serum troponin < 0.03 x 3. -monitor on telemetry with continuous pulse ox  -repeat EKG pending this morning -obtain Echo -consult to cardiology this am -continue daily ASA 81 mg, SL nitro prn  -Tylenol 650 mg Q6 PRN  -Zofran 4 mg Q6 PRN  -supplemental O2 as needed to maintain sats >92%  -vitals per unit routine   ?H/o MI.  Is documented on his problem list however pt denies any cardiac history or workup. -Continue daily aspirin 81 mg -Echo as above -consider stress test while inpatient if possible vs set up outpatient   Hyperkalemia- resolved  On admission with mild hyperkalemia to 5.3 > this morning 4.6 -monitor BMET    HTN-  stable. BP this morning 126/88 -continue home Lisinopril -Continue to monitor bp  HLD- Lipitor 20 mg on home med list however pt reports he is no longer taking it due to jittery feeling with associated nausea and vomiting.  Of note, he states he was told his lipid profile was wnl and that he did not need to be on a statin medication.   -lipid panel repeated, 10 year ASCVD 15.8% indicating initiating a statin -will start Crestor due to side effects from Lipitor, if does not tolerate can try qod dosing  COPD- stable  At home on Spiriva, and albuterol prn.  Long history of tobacco use.  Recently quit smoking.  Used to smoke 1-1/2 PPD x 30 years.   -Continue home Spiriva  18 mcg daily -albuterol 2 puffs Q6 PRN  -combivent Q6 PRN   Chronic pain- stable on home regimen  Long-standing history of back, neck, and knee pain. Takes Norco/Vicodin 5-325 2 tabs BID prn at home, as well as Prednisone 5 mg daily from rehab for his knee x the past 1 month and MTX 15 mg once a week.  Additionally taking Gabapentin 900 mg TID.  -Follow up with Orthopedics at [place] home meds  -Continue to monitor pain   Vertigo- Chronic, asymptomatic. Pt used to take Antivert but states he no longer needs to as he no longer experiences symptoms.   -Will continue to monitor   Depression- Stable. At home on Wellbutrin 300 mg and Cymbalta 30-60 mg.  H/o suicide attempt is documented in his chart.  Currently denies SI/HI and notes he is doing well on his medications.  -Continue home Wellbutrin -Continue home Cymbalta   Anxiety-  Stable.  Takes Latuda at home.  -Continue home medication   FEN/GI: SLIV, heart healthy diet  Prophylaxis: Lovenox   Disposition: home pending ACS work up  Subjective:  Mr. Carchi is doing well this morning, his chest pain has resolved. He is worried about his blood pressure and follows this closely at home. Denies SOB, pain anywhere. No other complaints or concerns at this time.   Objective: Temp:  [97.5 F (36.4 C)-98.8 F (37.1 C)] 98.7 F (37.1 C) (07/22 0900) Pulse Rate:  [  75-115] 97 (07/22 0900) Resp:  [12-24] 20 (07/22 0900) BP: (111-151)/(71-99) 131/77 (07/22 0900) SpO2:  [90 %-95 %] 93 % (07/22 0900) Weight:  [312 lb (141.5 kg)-315 lb (142.9 kg)] 312 lb (141.5 kg) (07/21 2300) Physical Exam: General: pleasant gentleman sitting up on side of bed in NAD Cardiovascular: RRR no MRG Respiratory: CTAB, no increased WOB Abdomen: obese abdomen, soft, NTND Extremities: no edema or cyanosis noted  Laboratory:  Recent Labs Lab 09/17/16 1450 09/18/16 0243  WBC 13.6* 10.2  HGB 15.0 13.3  HCT 46.2 42.1  PLT 262 229    Recent  Labs Lab 09/17/16 1450 09/18/16 0243  NA 135 138  K 5.3* 4.6  CL 99* 106  CO2 26 25  BUN 14 16  CREATININE 1.23 1.01  CALCIUM 9.2 8.7*  GLUCOSE 121* 95   BNP 12.3 TSH 1.501 Lipid panel: LDL 103, HDL 42, Tri 197  Imaging/Diagnostic Tests:  Dg Chest 2 View IMPRESSION: COPD changes with LEFT basilar scarring. No acute abnormalities. Emphysema (ICD10-J43.9).  Ct Angio Chest Pe W And/or Wo Contrast IMPRESSION: 1. Centrilobular emphysema. Thick-walled cavitation or bulla at the left lung base may reflect prior infection. Neoplasm believed less likely in the absence ancillary findings. Short term interval follow-up is suggested to assure stability Consider one of the following in 3 months for both low-risk and high-risk individuals: (a) repeat chest CT, (b) follow-up PET-CT, or (c) tissue sampling. This recommendation follows the consensus statement: Guidelines for Management of Incidental Pulmonary Nodules Detected on CT Images: From the Fleischner Society 2017; Radiology 2017; 284:228-243. 2. No acute pulmonary embolus. Aortic Atherosclerosis (ICD10-I70.0) and Emphysema (ICD10-J43.9).  Steve Rattler, DO 09/18/2016, 9:25 AM PGY-2, [place] Intern pager: [phone], text pages welcome

## 2016-09-19 LAB — ECHOCARDIOGRAM COMPLETE
Height: 74 in
Weight: 4992 oz

## 2016-09-21 LAB — HIV ANTIBODY (ROUTINE TESTING W REFLEX): HIV Screen 4th Generation wRfx: NONREACTIVE

## 2016-09-21 LAB — HEMOGLOBIN A1C
Hgb A1c MFr Bld: 6.1 % — ABNORMAL HIGH (ref 4.8–5.6)
Mean Plasma Glucose: 128 mg/dL

## 2017-07-15 ENCOUNTER — Inpatient Hospital Stay (HOSPITAL_COMMUNITY)
Admission: EM | Admit: 2017-07-15 | Discharge: 2017-07-21 | DRG: 603 | Disposition: A | Discharge status: Home Health | Payer: Medicare Other | Attending: Internal Medicine | Admitting: Internal Medicine

## 2017-07-15 ENCOUNTER — Encounter (HOSPITAL_COMMUNITY): Payer: Self-pay | Admitting: Emergency Medicine

## 2017-07-15 DIAGNOSIS — L97321 Non-pressure chronic ulcer of left ankle limited to breakdown of skin: Secondary | ICD-10-CM

## 2017-07-15 DIAGNOSIS — L97929 Non-pressure chronic ulcer of unspecified part of left lower leg with unspecified severity: Secondary | ICD-10-CM | POA: Diagnosis present

## 2017-07-15 DIAGNOSIS — L97521 Non-pressure chronic ulcer of other part of left foot limited to breakdown of skin: Secondary | ICD-10-CM

## 2017-07-15 DIAGNOSIS — F17210 Nicotine dependence, cigarettes, uncomplicated: Secondary | ICD-10-CM | POA: Diagnosis present

## 2017-07-15 DIAGNOSIS — G4733 Obstructive sleep apnea (adult) (pediatric): Secondary | ICD-10-CM | POA: Diagnosis present

## 2017-07-15 DIAGNOSIS — E1159 Type 2 diabetes mellitus with other circulatory complications: Secondary | ICD-10-CM | POA: Diagnosis present

## 2017-07-15 DIAGNOSIS — Z885 Allergy status to narcotic agent status: Secondary | ICD-10-CM

## 2017-07-15 DIAGNOSIS — L03116 Cellulitis of left lower limb: Secondary | ICD-10-CM | POA: Diagnosis present

## 2017-07-15 DIAGNOSIS — Z79899 Other long term (current) drug therapy: Secondary | ICD-10-CM

## 2017-07-15 DIAGNOSIS — G8929 Other chronic pain: Secondary | ICD-10-CM

## 2017-07-15 DIAGNOSIS — E119 Type 2 diabetes mellitus without complications: Secondary | ICD-10-CM

## 2017-07-15 DIAGNOSIS — L97919 Non-pressure chronic ulcer of unspecified part of right lower leg with unspecified severity: Secondary | ICD-10-CM | POA: Diagnosis present

## 2017-07-15 DIAGNOSIS — I10 Essential (primary) hypertension: Secondary | ICD-10-CM | POA: Diagnosis present

## 2017-07-15 DIAGNOSIS — J449 Chronic obstructive pulmonary disease, unspecified: Secondary | ICD-10-CM | POA: Diagnosis present

## 2017-07-15 DIAGNOSIS — L03115 Cellulitis of right lower limb: Secondary | ICD-10-CM | POA: Diagnosis not present

## 2017-07-15 DIAGNOSIS — Z7952 Long term (current) use of systemic steroids: Secondary | ICD-10-CM

## 2017-07-15 DIAGNOSIS — Z8614 Personal history of Methicillin resistant Staphylococcus aureus infection: Secondary | ICD-10-CM

## 2017-07-15 DIAGNOSIS — M79605 Pain in left leg: Secondary | ICD-10-CM

## 2017-07-15 DIAGNOSIS — L97511 Non-pressure chronic ulcer of other part of right foot limited to breakdown of skin: Secondary | ICD-10-CM

## 2017-07-15 DIAGNOSIS — Z6841 Body Mass Index (BMI) 40.0 and over, adult: Secondary | ICD-10-CM

## 2017-07-15 DIAGNOSIS — Z7984 Long term (current) use of oral hypoglycemic drugs: Secondary | ICD-10-CM

## 2017-07-15 DIAGNOSIS — H209 Unspecified iridocyclitis: Secondary | ICD-10-CM | POA: Diagnosis present

## 2017-07-15 DIAGNOSIS — I776 Arteritis, unspecified: Secondary | ICD-10-CM | POA: Diagnosis present

## 2017-07-15 DIAGNOSIS — Z888 Allergy status to other drugs, medicaments and biological substances status: Secondary | ICD-10-CM

## 2017-07-15 DIAGNOSIS — Z1589 Genetic susceptibility to other disease: Secondary | ICD-10-CM

## 2017-07-15 DIAGNOSIS — L97311 Non-pressure chronic ulcer of right ankle limited to breakdown of skin: Secondary | ICD-10-CM

## 2017-07-15 DIAGNOSIS — M79604 Pain in right leg: Secondary | ICD-10-CM

## 2017-07-15 DIAGNOSIS — F319 Bipolar disorder, unspecified: Secondary | ICD-10-CM | POA: Diagnosis present

## 2017-07-15 DIAGNOSIS — I152 Hypertension secondary to endocrine disorders: Secondary | ICD-10-CM | POA: Diagnosis present

## 2017-07-15 DIAGNOSIS — E11622 Type 2 diabetes mellitus with other skin ulcer: Secondary | ICD-10-CM | POA: Diagnosis present

## 2017-07-15 DIAGNOSIS — I252 Old myocardial infarction: Secondary | ICD-10-CM

## 2017-07-15 DIAGNOSIS — Z85828 Personal history of other malignant neoplasm of skin: Secondary | ICD-10-CM

## 2017-07-15 DIAGNOSIS — F419 Anxiety disorder, unspecified: Secondary | ICD-10-CM | POA: Diagnosis present

## 2017-07-15 DIAGNOSIS — E1140 Type 2 diabetes mellitus with diabetic neuropathy, unspecified: Secondary | ICD-10-CM | POA: Diagnosis present

## 2017-07-15 DIAGNOSIS — Z7982 Long term (current) use of aspirin: Secondary | ICD-10-CM

## 2017-07-15 NOTE — ED Triage Notes (Signed)
Pt comes to ed, via ems, diabetic foot pains and wound care management. Pt  out of pain meds. Pt v/s 180/98. Hr 112, spo2 92, cbg 175, rr20.  Bilateral leg pain/ wound care conducted every thursday. Pt comes from home.

## 2017-07-15 NOTE — ED Provider Notes (Signed)
[facility] DEPT Provider Note: Georgena Spurling, MD, FACEP  CSN: [id number] MRN: [id number] ARRIVAL: 07/15/17 at 2128 ROOM: 1304/1304-01   CHIEF COMPLAINT  Foot Pain   HISTORY OF PRESENT ILLNESS  07/15/17 11:41 PM Mario [name] is a 67 y.o. 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  He is followed at the [facility] in [place] where he sees wound care weekly.  He is out of pain medication and is having severe pain associated with these wounds.  The pain is severe enough to have him contemplating suicide at times.  He changes his bandages several times a day.  Pain is worse with palpation or when lying still.  He is not having associated fever.    Past Medical History:  Diagnosis Date  . Anxiety   . Bronchitis   . COPD (chronic obstructive pulmonary disease) ([place])   . Depression   . Hypertension   . Mental disorder   . MI (myocardial infarction) ([place])    ????  . OSA (obstructive sleep apnea)   . Suicide attempt ([place])   . Tension pneumothorax 06/27/2016    Past Surgical History:  Procedure Laterality Date  . CHEST TUBE INSERTION Left 06/27/2016  . cryptorchidism    . SKIN CANCER EXCISION      Family History  Problem Relation Age of Onset  . Dementia Father     Social History   Tobacco Use  . Smoking status: Current Every Day Smoker    Packs/day: 0.50    Years: 20.00    Pack years: 10.00    Types: Cigarettes  . Smokeless tobacco: Never Used  Substance Use Topics  . Alcohol use: No    Alcohol/week: 0.0 oz    Comment: denies use of any drugs or alcohol  . Drug use: No    Prior to Admission medications   Medication Sig Start Date End Date Taking? Authorizing Provider  acetaminophen (TYLENOL) 325 MG tablet Take 975 mg by mouth every 8 (eight) hours as needed for mild pain, moderate pain or headache.   Yes [provider]  albuterol (PROAIR HFA) 108 (90 Base) MCG/ACT inhaler Inhale 2  puffs into the lungs every 6 (six) hours as needed for wheezing or shortness of breath.   Yes [provider]  aspirin EC 81 MG tablet Take 81 mg by mouth daily.   Yes [provider]  buPROPion (WELLBUTRIN XL) 300 MG 24 hr tablet Take 300 mg by mouth daily after breakfast.    Yes [provider]  cholecalciferol (VITAMIN D) 1000 units tablet Take 3,000 Units by mouth daily.   Yes [provider]  DULoxetine (CYMBALTA) 30 MG capsule Take 1 capsule (30 mg total) by mouth daily. Patient taking differently: Take 30-60 mg by mouth See admin instructions. Take 2 capsules (60 mg) by mouth every morning and 1 capsule (30 mg) at bedtime 07/21/14  Yes Evelina Bucy, MD  folic acid (FOLVITE) 1 MG tablet Take 1 mg by mouth daily.   Yes [provider]  gabapentin (NEURONTIN) 300 MG capsule Take 2 capsules (600 mg total) by mouth 2 (two) times daily. Patient taking differently: Take 900 mg by mouth 3 (three) times daily.  07/06/16  Yes Tacy Dura, Donielle M, PA-C  Hypromellose (ARTIFICIAL TEARS OP) Place 1 drop into both eyes daily as needed (dry eyes).   Yes [provider]  Ipratropium-Albuterol (COMBIVENT RESPIMAT) 20-100 MCG/ACT AERS respimat Inhale  1-2 puffs into the lungs every 6 (six) hours as needed for wheezing.    Yes [provider]  lisinopril (PRINIVIL,ZESTRIL) 20 MG tablet Take 1 tablet (20 mg total) by mouth daily. For high blood pressure Patient taking differently: Take 20 mg by mouth daily.  01/21/13  Yes Mashburn, Marlane Hatcher, PA-C  lurasidone (LATUDA) 20 MG TABS tablet Take 20 mg by mouth at bedtime.   Yes [provider]  metFORMIN (GLUCOPHAGE) 1000 MG tablet Take 1,000 mg by mouth 2 (two) times daily with a meal.   Yes [provider]  methotrexate (RHEUMATREX) 2.5 MG tablet Take 15 mg by mouth every Saturday. Caution:Chemotherapy. Protect from light.    Yes [provider]  mirtazapine (REMERON) 15 MG tablet  Take 15 mg by mouth at bedtime.   Yes [provider]  oxyCODONE 10 MG TABS Take 10 mg by mouth every 4-6 hours PRN severe pain. Patient taking differently: Take 5-10 mg by mouth every 6 (six) hours as needed (pain).  07/06/16  Yes Lars Pinks M, PA-C  predniSONE (DELTASONE) 5 MG tablet Take 35 mg by mouth daily with breakfast.    Yes [provider]  PRESCRIPTION MEDICATION Take 1 application by mouth daily as needed (foot itching). Anti-fungal cream with moisturizer from [place]   Yes [provider]  [place] into the lungs at bedtime. CPAP   Yes [provider]  ranitidine (ZANTAC) 150 MG tablet Take 150 mg by mouth 2 (two) times daily.   Yes [provider]  sennosides-docusate sodium (SENOKOT-S) 8.6-50 MG tablet Take 1 tablet by mouth daily.    Yes [provider]  sodium chloride (OCEAN) 0.65 % SOLN nasal spray Place 2 sprays into both nostrils 4 (four) times daily as needed for congestion.   Yes [provider]  tiotropium (SPIRIVA) 18 MCG inhalation capsule Place 1 capsule (18 mcg total) into inhaler and inhale daily. 05/26/14  Yes Mikhail, Velta Addison, DO  traMADol (ULTRAM) 50 MG tablet Take 100 mg by mouth every 8 (eight) hours as needed for moderate pain or severe pain.   Yes [provider]    Allergies Demerol [meperidine] and Zocor [simvastatin]   REVIEW OF SYSTEMS  Negative except as noted here or in the History of Present Illness.   PHYSICAL EXAMINATION  Initial Vital Signs Blood pressure 128/85, pulse 99, resp. rate 18, SpO2 94 %.  Examination General: Well-developed, obese male in no acute distress; appearance consistent with age of record HENT: normocephalic; atraumatic Eyes: pupils equal, round and reactive to light; extraocular muscles intact Neck: supple Heart: regular rate and rhythm Lungs: clear to auscultation bilaterally Abdomen: soft; nondistended; nontender; bowel  sounds present Extremities: No deformity; full range of motion; pulses normal Neurologic: Awake, alert and oriented; motor function intact in all extremities and symmetric; no facial droop Skin: Warm and dry; multiple ulcerations of the skin of the ankles and feet with surrounding erythema but no significant warmth:     Psychiatric: Normal mood and affect   RESULTS  Summary of this visit's results, reviewed by myself:   EKG Interpretation  Date/Time:    Ventricular Rate:    PR Interval:    QRS Duration:   QT Interval:    QTC Calculation:   R Axis:     Text Interpretation:        Laboratory Studies: Results for orders placed or performed during the hospital encounter of 07/15/17 (from the past 24 hour(s))  CBC with  Differential/Platelet     Status: Abnormal   Collection Time: 07/16/17 12:14 AM  Result Value Ref Range   WBC 13.5 (H) 4.0 - 10.5 K/uL   RBC 4.21 (L) 4.22 - 5.81 MIL/uL   Hemoglobin 14.4 13.0 - 17.0 g/dL   HCT 43.9 39.0 - 52.0 %   MCV 104.3 (H) 78.0 - 100.0 fL   MCH 34.2 (H) 26.0 - 34.0 pg   MCHC 32.8 30.0 - 36.0 g/dL   RDW 14.5 11.5 - 15.5 %   Platelets 307 150 - 400 K/uL   Neutrophils Relative % 82 %   Lymphocytes Relative 13 %   Monocytes Relative 5 %   Eosinophils Relative 0 %   Basophils Relative 0 %   Neutro Abs 11.0 (H) 1.7 - 7.7 K/uL   Lymphs Abs 1.8 0.7 - 4.0 K/uL   Monocytes Absolute 0.7 0.1 - 1.0 K/uL   Eosinophils Absolute 0.0 0.0 - 0.7 K/uL   Basophils Absolute 0.0 0.0 - 0.1 K/uL   Smear Review MORPHOLOGY UNREMARKABLE   Basic metabolic panel     Status: Abnormal   Collection Time: 07/16/17 12:14 AM  Result Value Ref Range   Sodium 141 135 - 145 mmol/L   Potassium 4.9 3.5 - 5.1 mmol/L   Chloride 105 101 - 111 mmol/L   CO2 23 22 - 32 mmol/L   Glucose, Bld 139 (H) 65 - 99 mg/dL   BUN 20 6 - 20 mg/dL   Creatinine, Ser 1.18 0.61 - 1.24 mg/dL   Calcium 9.4 8.9 - 10.3 mg/dL   GFR calc non Af Amer >60 >60 mL/min   GFR calc Af Amer >60 >60  mL/min   Anion gap 13 5 - 15  Glucose, capillary     Status: Abnormal   Collection Time: 07/16/17  7:20 AM  Result Value Ref Range   Glucose-Capillary 100 (H) 65 - 99 mg/dL   Imaging Studies: No results found.  ED COURSE and MDM  Nursing notes and initial vitals signs, including pulse oximetry, reviewed.  Vitals:   07/16/17 0224 07/16/17 0232 07/16/17 0554 07/16/17 0621  BP:  135/85 140/90 124/80  Pulse:  100 100 89  Resp:  17 19 16   Temp:   98.8 F (37.1 C) (!) 97.5 F (36.4 C)  TempSrc:   Oral Oral  SpO2: 93% (!) 89% 93% 98%   2:01 AM Patient's wound was redressed with Xeroform gauze and lidocaine cream.  Patient is failing outpatient management of his wound care and pain management.  We will have him admitted.  PROCEDURES    ED DIAGNOSES     ICD-10-CM   1. Chronic pain of lower extremity, bilateral M79.604    M79.605    G89.29   2. Skin ulcer of ankle, left, limited to breakdown of skin ([place]) L97.321   3. Skin ulcer of ankle, right, limited to breakdown of skin ([place]) L97.311   4. Skin ulcer of left foot, limited to breakdown of skin ([place]) L97.521   5. Skin ulcer of right foot, limited to breakdown of skin ([place]) L97.511        Shanon Rosser, MD 07/16/17 [phone]

## 2017-07-15 NOTE — ED Notes (Signed)
Bed: WA09 Expected date:  Expected time:  Means of arrival:  Comments: 67 yo M/Foot wound-pain

## 2017-07-15 NOTE — ED Notes (Signed)
"  Pt has verbalized he needs to get pain meds or he will just end it, by using his insulin at home."

## 2017-07-16 ENCOUNTER — Encounter (HOSPITAL_COMMUNITY): Payer: Self-pay

## 2017-07-16 ENCOUNTER — Other Ambulatory Visit: Payer: Self-pay

## 2017-07-16 DIAGNOSIS — Z794 Long term (current) use of insulin: Secondary | ICD-10-CM

## 2017-07-16 DIAGNOSIS — Z1589 Genetic susceptibility to other disease: Secondary | ICD-10-CM | POA: Diagnosis not present

## 2017-07-16 DIAGNOSIS — F17210 Nicotine dependence, cigarettes, uncomplicated: Secondary | ICD-10-CM | POA: Diagnosis present

## 2017-07-16 DIAGNOSIS — F419 Anxiety disorder, unspecified: Secondary | ICD-10-CM | POA: Diagnosis present

## 2017-07-16 DIAGNOSIS — I776 Arteritis, unspecified: Secondary | ICD-10-CM | POA: Diagnosis present

## 2017-07-16 DIAGNOSIS — Z79899 Other long term (current) drug therapy: Secondary | ICD-10-CM | POA: Diagnosis not present

## 2017-07-16 DIAGNOSIS — L97929 Non-pressure chronic ulcer of unspecified part of left lower leg with unspecified severity: Secondary | ICD-10-CM | POA: Diagnosis present

## 2017-07-16 DIAGNOSIS — Z7982 Long term (current) use of aspirin: Secondary | ICD-10-CM | POA: Diagnosis not present

## 2017-07-16 DIAGNOSIS — L03115 Cellulitis of right lower limb: Secondary | ICD-10-CM | POA: Diagnosis present

## 2017-07-16 DIAGNOSIS — E119 Type 2 diabetes mellitus without complications: Secondary | ICD-10-CM | POA: Diagnosis not present

## 2017-07-16 DIAGNOSIS — L0390 Cellulitis, unspecified: Secondary | ICD-10-CM | POA: Diagnosis not present

## 2017-07-16 DIAGNOSIS — L97311 Non-pressure chronic ulcer of right ankle limited to breakdown of skin: Secondary | ICD-10-CM

## 2017-07-16 DIAGNOSIS — M79605 Pain in left leg: Secondary | ICD-10-CM

## 2017-07-16 DIAGNOSIS — I10 Essential (primary) hypertension: Secondary | ICD-10-CM | POA: Diagnosis present

## 2017-07-16 DIAGNOSIS — J449 Chronic obstructive pulmonary disease, unspecified: Secondary | ICD-10-CM | POA: Diagnosis present

## 2017-07-16 DIAGNOSIS — Z6841 Body Mass Index (BMI) 40.0 and over, adult: Secondary | ICD-10-CM | POA: Diagnosis not present

## 2017-07-16 DIAGNOSIS — E1140 Type 2 diabetes mellitus with diabetic neuropathy, unspecified: Secondary | ICD-10-CM | POA: Diagnosis present

## 2017-07-16 DIAGNOSIS — Z888 Allergy status to other drugs, medicaments and biological substances status: Secondary | ICD-10-CM | POA: Diagnosis not present

## 2017-07-16 DIAGNOSIS — E11622 Type 2 diabetes mellitus with other skin ulcer: Secondary | ICD-10-CM | POA: Diagnosis present

## 2017-07-16 DIAGNOSIS — L97521 Non-pressure chronic ulcer of other part of left foot limited to breakdown of skin: Secondary | ICD-10-CM

## 2017-07-16 DIAGNOSIS — Z885 Allergy status to narcotic agent status: Secondary | ICD-10-CM | POA: Diagnosis not present

## 2017-07-16 DIAGNOSIS — M79604 Pain in right leg: Secondary | ICD-10-CM

## 2017-07-16 DIAGNOSIS — L03116 Cellulitis of left lower limb: Secondary | ICD-10-CM | POA: Diagnosis present

## 2017-07-16 DIAGNOSIS — G8929 Other chronic pain: Secondary | ICD-10-CM

## 2017-07-16 DIAGNOSIS — Z85828 Personal history of other malignant neoplasm of skin: Secondary | ICD-10-CM | POA: Diagnosis not present

## 2017-07-16 DIAGNOSIS — L97511 Non-pressure chronic ulcer of other part of right foot limited to breakdown of skin: Secondary | ICD-10-CM

## 2017-07-16 DIAGNOSIS — E118 Type 2 diabetes mellitus with unspecified complications: Secondary | ICD-10-CM | POA: Diagnosis not present

## 2017-07-16 DIAGNOSIS — I252 Old myocardial infarction: Secondary | ICD-10-CM | POA: Diagnosis not present

## 2017-07-16 DIAGNOSIS — Z7952 Long term (current) use of systemic steroids: Secondary | ICD-10-CM | POA: Diagnosis not present

## 2017-07-16 DIAGNOSIS — L97321 Non-pressure chronic ulcer of left ankle limited to breakdown of skin: Secondary | ICD-10-CM | POA: Diagnosis not present

## 2017-07-16 DIAGNOSIS — F319 Bipolar disorder, unspecified: Secondary | ICD-10-CM | POA: Diagnosis present

## 2017-07-16 DIAGNOSIS — Z8614 Personal history of Methicillin resistant Staphylococcus aureus infection: Secondary | ICD-10-CM | POA: Diagnosis not present

## 2017-07-16 DIAGNOSIS — G4733 Obstructive sleep apnea (adult) (pediatric): Secondary | ICD-10-CM | POA: Diagnosis present

## 2017-07-16 DIAGNOSIS — H209 Unspecified iridocyclitis: Secondary | ICD-10-CM | POA: Diagnosis present

## 2017-07-16 DIAGNOSIS — L97919 Non-pressure chronic ulcer of unspecified part of right lower leg with unspecified severity: Secondary | ICD-10-CM | POA: Diagnosis present

## 2017-07-16 LAB — BASIC METABOLIC PANEL
Anion gap: 13 (ref 5–15)
BUN: 20 mg/dL (ref 6–20)
CO2: 23 mmol/L (ref 22–32)
Calcium: 9.4 mg/dL (ref 8.9–10.3)
Chloride: 105 mmol/L (ref 101–111)
Creatinine, Ser: 1.18 mg/dL (ref 0.61–1.24)
GFR calc Af Amer: >60 mL/min (ref >60)
GFR calc non Af Amer: >60 mL/min (ref >60)
Glucose, Bld: 139 mg/dL — ABNORMAL HIGH (ref 65–99)
Potassium: 4.9 mmol/L (ref 3.5–5.1)
Sodium: 141 mmol/L (ref 135–145)

## 2017-07-16 LAB — CBC WITH DIFFERENTIAL/PLATELET
Basophils Absolute: 0 10*3/uL (ref 0.0–0.1)
Basophils Relative: 0 %
Eosinophils Absolute: 0 10*3/uL (ref 0.0–0.7)
Eosinophils Relative: 0 %
HCT: 43.9 % (ref 39.0–52.0)
Hemoglobin: 14.4 g/dL (ref 13.0–17.0)
Lymphocytes Relative: 13 %
Lymphs Abs: 1.8 10*3/uL (ref 0.7–4.0)
MCH: 34.2 pg — ABNORMAL HIGH (ref 26.0–34.0)
MCHC: 32.8 g/dL (ref 30.0–36.0)
MCV: 104.3 fL — ABNORMAL HIGH (ref 78.0–100.0)
Monocytes Absolute: 0.7 10*3/uL (ref 0.1–1.0)
Monocytes Relative: 5 %
Neutro Abs: 11 10*3/uL — ABNORMAL HIGH (ref 1.7–7.7)
Neutrophils Relative %: 82 %
Platelets: 307 10*3/uL (ref 150–400)
RBC: 4.21 MIL/uL — ABNORMAL LOW (ref 4.22–5.81)
RDW: 14.5 % (ref 11.5–15.5)
WBC: 13.5 10*3/uL — ABNORMAL HIGH (ref 4.0–10.5)

## 2017-07-16 LAB — GLUCOSE, CAPILLARY
Glucose-Capillary: 100 mg/dL — ABNORMAL HIGH (ref 65–99)
Glucose-Capillary: 164 mg/dL — ABNORMAL HIGH (ref 65–99)
Glucose-Capillary: 142 mg/dL — ABNORMAL HIGH (ref 65–99)
Glucose-Capillary: 161 mg/dL — ABNORMAL HIGH (ref 65–99)

## 2017-07-16 MED ORDER — ENOXAPARIN SODIUM 40 MG/0.4ML ~~LOC~~ SOLN
40.0000 mg | SUBCUTANEOUS | Status: DC
  Administered 2017-07-16 – 2017-07-21 (×6): 40 mg via SUBCUTANEOUS
  Filled 2017-07-16 (×7): qty 0.4

## 2017-07-16 MED ORDER — PREDNISONE 5 MG PO TABS
35.0000 mg | ORAL_TABLET | Freq: Every day | ORAL | Status: DC
  Administered 2017-07-17 – 2017-07-21 (×5): 35 mg via ORAL
  Filled 2017-07-16 (×2): qty 3
  Filled 2017-07-16: qty 1
  Filled 2017-07-16 (×2): qty 3

## 2017-07-16 MED ORDER — INSULIN GLARGINE 100 UNIT/ML ~~LOC~~ SOLN
12.0000 [IU] | Freq: Every day | SUBCUTANEOUS | Status: DC
  Administered 2017-07-16 – 2017-07-19 (×4): 12 [IU] via SUBCUTANEOUS
  Filled 2017-07-16 (×5): qty 0.12

## 2017-07-16 MED ORDER — VITAMIN D3 25 MCG (1000 UNIT) PO TABS
3000.0000 [IU] | ORAL_TABLET | Freq: Every day | ORAL | Status: DC
  Administered 2017-07-16 – 2017-07-21 (×6): 3000 [IU] via ORAL
  Filled 2017-07-16 (×6): qty 3

## 2017-07-16 MED ORDER — CLINDAMYCIN PHOSPHATE 600 MG/50ML IV SOLN
600.0000 mg | Freq: Three times a day (TID) | INTRAVENOUS | Status: DC
  Filled 2017-07-16: qty 50

## 2017-07-16 MED ORDER — SODIUM CHLORIDE 0.9 % IV SOLN: 100.0000 mg | Freq: Two times a day (BID) | INTRAVENOUS | Status: DC

## 2017-07-16 MED ORDER — ALBUTEROL SULFATE (2.5 MG/3ML) 0.083% IN NEBU: 3.0000 mL | INHALATION_SOLUTION | Freq: Four times a day (QID) | RESPIRATORY_TRACT | Status: DC | PRN

## 2017-07-16 MED ORDER — ONDANSETRON HCL 4 MG PO TABS: 4.0000 mg | ORAL_TABLET | Freq: Four times a day (QID) | ORAL | Status: DC | PRN

## 2017-07-16 MED ORDER — CLINDAMYCIN PHOSPHATE 600 MG/50ML IV SOLN: 600.0000 mg | Freq: Three times a day (TID) | INTRAVENOUS | Status: DC

## 2017-07-16 MED ORDER — ONDANSETRON HCL 4 MG/2ML IJ SOLN
4.0000 mg | Freq: Once | INTRAMUSCULAR | Status: AC
  Administered 2017-07-16: 4 mg via INTRAVENOUS
  Filled 2017-07-16: qty 2

## 2017-07-16 MED ORDER — LIDOCAINE 5 % EX OINT
TOPICAL_OINTMENT | CUTANEOUS | Status: DC | PRN
  Administered 2017-07-16: 1 via TOPICAL
  Filled 2017-07-16: qty 35.44

## 2017-07-16 MED ORDER — MIRTAZAPINE 15 MG PO TABS
15.0000 mg | ORAL_TABLET | Freq: Every day | ORAL | Status: DC
  Administered 2017-07-16 – 2017-07-20 (×5): 15 mg via ORAL
  Filled 2017-07-16 (×2): qty 1
  Filled 2017-07-16: qty 0.5
  Filled 2017-07-16 (×3): qty 1

## 2017-07-16 MED ORDER — DULOXETINE HCL 60 MG PO CPEP
60.0000 mg | ORAL_CAPSULE | Freq: Every day | ORAL | Status: DC
  Administered 2017-07-16 – 2017-07-20 (×5): 60 mg via ORAL
  Filled 2017-07-16 (×5): qty 1

## 2017-07-16 MED ORDER — INSULIN ASPART 100 UNIT/ML ~~LOC~~ SOLN
0.0000 [IU] | Freq: Three times a day (TID) | SUBCUTANEOUS | Status: DC
  Administered 2017-07-16: 2 [IU] via SUBCUTANEOUS
  Administered 2017-07-16 – 2017-07-17 (×3): 1 [IU] via SUBCUTANEOUS
  Administered 2017-07-18: 2 [IU] via SUBCUTANEOUS
  Administered 2017-07-18: 1 [IU] via SUBCUTANEOUS
  Administered 2017-07-19 (×2): 2 [IU] via SUBCUTANEOUS
  Administered 2017-07-20: 1 [IU] via SUBCUTANEOUS

## 2017-07-16 MED ORDER — IPRATROPIUM-ALBUTEROL 0.5-2.5 (3) MG/3ML IN SOLN: 3.0000 mL | Freq: Four times a day (QID) | RESPIRATORY_TRACT | Status: DC | PRN

## 2017-07-16 MED ORDER — SODIUM CHLORIDE 0.9 % IV BOLUS
1000.0000 mL | Freq: Once | INTRAVENOUS | Status: AC
  Administered 2017-07-16: 1000 mL via INTRAVENOUS

## 2017-07-16 MED ORDER — VANCOMYCIN HCL IN DEXTROSE 1-5 GM/200ML-% IV SOLN
1000.0000 mg | Freq: Once | INTRAVENOUS | Status: DC
  Filled 2017-07-16: qty 200

## 2017-07-16 MED ORDER — TIOTROPIUM BROMIDE MONOHYDRATE 18 MCG IN CAPS
18.0000 ug | ORAL_CAPSULE | Freq: Every day | RESPIRATORY_TRACT | Status: DC
  Administered 2017-07-17 – 2017-07-21 (×5): 18 ug via RESPIRATORY_TRACT
  Filled 2017-07-16: qty 5

## 2017-07-16 MED ORDER — FAMOTIDINE 20 MG PO TABS
20.0000 mg | ORAL_TABLET | Freq: Two times a day (BID) | ORAL | Status: DC
  Administered 2017-07-16 – 2017-07-21 (×11): 20 mg via ORAL
  Filled 2017-07-16 (×11): qty 1

## 2017-07-16 MED ORDER — TRAMADOL HCL 50 MG PO TABS
100.0000 mg | ORAL_TABLET | Freq: Three times a day (TID) | ORAL | Status: DC | PRN
  Administered 2017-07-16 – 2017-07-17 (×3): 100 mg via ORAL
  Filled 2017-07-16 (×3): qty 2

## 2017-07-16 MED ORDER — POLYVINYL ALCOHOL 1.4 % OP SOLN: 1.0000 [drp] | OPHTHALMIC | Status: DC | PRN

## 2017-07-16 MED ORDER — LISINOPRIL 20 MG PO TABS
20.0000 mg | ORAL_TABLET | Freq: Every day | ORAL | Status: DC
  Administered 2017-07-16 – 2017-07-21 (×6): 20 mg via ORAL
  Filled 2017-07-16 (×6): qty 1

## 2017-07-16 MED ORDER — BUPROPION HCL ER (XL) 150 MG PO TB24
300.0000 mg | ORAL_TABLET | Freq: Every day | ORAL | Status: DC
  Administered 2017-07-16 – 2017-07-21 (×6): 300 mg via ORAL
  Filled 2017-07-16 (×6): qty 2

## 2017-07-16 MED ORDER — OXYCODONE-ACETAMINOPHEN 7.5-325 MG PO TABS
1.0000 | ORAL_TABLET | Freq: Four times a day (QID) | ORAL | Status: DC | PRN
  Administered 2017-07-16 – 2017-07-17 (×4): 2 via ORAL
  Filled 2017-07-16 (×4): qty 2

## 2017-07-16 MED ORDER — VANCOMYCIN HCL 10 G IV SOLR
2500.0000 mg | Freq: Once | INTRAVENOUS | Status: AC
  Administered 2017-07-16: 2500 mg via INTRAVENOUS
  Filled 2017-07-16: qty 2500

## 2017-07-16 MED ORDER — HYDROMORPHONE HCL 1 MG/ML IJ SOLN
1.0000 mg | Freq: Once | INTRAMUSCULAR | Status: AC
  Administered 2017-07-16: 1 mg via INTRAVENOUS
  Filled 2017-07-16: qty 1

## 2017-07-16 MED ORDER — LURASIDONE HCL 20 MG PO TABS
20.0000 mg | ORAL_TABLET | Freq: Every day | ORAL | Status: DC
  Administered 2017-07-16 – 2017-07-20 (×5): 20 mg via ORAL
  Filled 2017-07-16 (×5): qty 1

## 2017-07-16 MED ORDER — SODIUM CHLORIDE 0.9 % IV SOLN
2000.0000 mg | INTRAVENOUS | Status: DC
  Administered 2017-07-17: 2000 mg via INTRAVENOUS
  Filled 2017-07-16 (×2): qty 2000

## 2017-07-16 MED ORDER — ASPIRIN EC 81 MG PO TBEC
81.0000 mg | DELAYED_RELEASE_TABLET | Freq: Every day | ORAL | Status: DC
  Administered 2017-07-16 – 2017-07-21 (×6): 81 mg via ORAL
  Filled 2017-07-16 (×6): qty 1

## 2017-07-16 MED ORDER — PREDNISONE 50 MG PO TABS
35.0000 mg | ORAL_TABLET | Freq: Every day | ORAL | Status: DC
  Administered 2017-07-16: 35 mg via ORAL
  Filled 2017-07-16 (×2): qty 0.5

## 2017-07-16 MED ORDER — SALINE SPRAY 0.65 % NA SOLN: 2.0000 | Freq: Four times a day (QID) | NASAL | Status: DC | PRN

## 2017-07-16 MED ORDER — ACETAMINOPHEN 650 MG RE SUPP: 650.0000 mg | Freq: Four times a day (QID) | RECTAL | Status: DC | PRN

## 2017-07-16 MED ORDER — DULOXETINE HCL 30 MG PO CPEP
30.0000 mg | ORAL_CAPSULE | Freq: Every day | ORAL | Status: DC
  Administered 2017-07-16 – 2017-07-19 (×4): 30 mg via ORAL
  Filled 2017-07-16 (×4): qty 1

## 2017-07-16 MED ORDER — GABAPENTIN 300 MG PO CAPS
900.0000 mg | ORAL_CAPSULE | Freq: Three times a day (TID) | ORAL | Status: DC
  Administered 2017-07-16 – 2017-07-21 (×16): 900 mg via ORAL
  Filled 2017-07-16 (×16): qty 3

## 2017-07-16 MED ORDER — FOLIC ACID 1 MG PO TABS
1.0000 mg | ORAL_TABLET | Freq: Every day | ORAL | Status: DC
  Administered 2017-07-16 – 2017-07-21 (×6): 1 mg via ORAL
  Filled 2017-07-16 (×6): qty 1

## 2017-07-16 MED ORDER — SENNOSIDES-DOCUSATE SODIUM 8.6-50 MG PO TABS
1.0000 | ORAL_TABLET | Freq: Every day | ORAL | Status: DC
  Administered 2017-07-16 – 2017-07-21 (×6): 1 via ORAL
  Filled 2017-07-16 (×5): qty 1

## 2017-07-16 MED ORDER — ACETAMINOPHEN 325 MG PO TABS
650.0000 mg | ORAL_TABLET | Freq: Four times a day (QID) | ORAL | Status: DC | PRN
  Administered 2017-07-18: 650 mg via ORAL
  Filled 2017-07-16: qty 2

## 2017-07-16 MED ORDER — SILVER SULFADIAZINE 1 % EX CREA
TOPICAL_CREAM | Freq: Every day | CUTANEOUS | Status: DC
  Administered 2017-07-16: 16:00:00 via TOPICAL
  Administered 2017-07-17: 1 via TOPICAL
  Administered 2017-07-18 – 2017-07-21 (×3): via TOPICAL
  Filled 2017-07-16 (×4): qty 50

## 2017-07-16 MED ORDER — ONDANSETRON HCL 4 MG/2ML IJ SOLN: 4.0000 mg | Freq: Four times a day (QID) | INTRAMUSCULAR | Status: DC | PRN

## 2017-07-16 NOTE — Progress Notes (Signed)
Pharmacy Antibiotic Note  Kaedyn Polivka is a 67 y.o. male admitted on 07/15/2017 with cellulitis.  Pharmacy has been consulted for vanc dosing.  Plan: 1) Vancomycin 2500mg  IV x 1 then 2g IV q24 - goal AUC 400-500 2) Will check vanc pk/tr as appropriate  Height: 6\' 2"  (188 cm) Weight: (!) 329 lb 6.4 oz (149.4 kg) IBW/kg (Calculated) : 82.2  Temp (24hrs), Avg:98.2 F (36.8 C), Min:97.5 F (36.4 C), Max:98.8 F (37.1 C)  Recent Labs  Lab 07/16/17 0014  WBC 13.5*  CREATININE 1.18    Estimated Creatinine Clearance: 96.3 mL/min (by C-G formula based on SCr of 1.18 mg/dL).    Allergies  Allergen Reactions  . Demerol [Meperidine] Nausea And Vomiting    Violently sick  . Zocor [Simvastatin] Other (See Comments)    Made him very jittery, nausea and vomiting     Thank you for allowing pharmacy to be a part of this patient's care.  Kara Mead 07/16/2017 10:19 AM

## 2017-07-16 NOTE — Progress Notes (Signed)
Received from ED at [facility].  Complaining of shooting pain, skin warm and dry, alert and oriented.   No family at bedside.

## 2017-07-16 NOTE — H&P (Signed)
History and Physical    Mario [name] VOP:[id number] DOB: February 12, 1951 DOA: 07/15/2017  PCP: [facility]  Patient coming from: Home  I have personally briefly reviewed patient's old medical records in [place]  Chief Complaint: Foot pain  HPI: Mario [name] is a 67 y.o. male with medical history significant of HLA-B27, uveitis.  Patient is normally on prednisone and MTX.  Patient developed painful red bumps on forearms and feet in Jan-Feb.  Sounds like he was treated with prednisone, MTX, and possibly mycophenolate for a time (not currently).  Bumps on forearm went away but developed weeping sores on feet.   His diagnostic work up was at the [place] and has included Derm consult, Rheum consult, and skin biopsy x3.  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  These have persisted despite a recent course of keflex.  He was told to hold MTX while he was on Keflex. So this has been on hold.  Feet are severely painful.  He is out of pain meds.   ED Course: Pain controlled with narcotics.  Hospital ist asked to admit as he has failed outpatient treatment.   Review of Systems: As per HPI otherwise 10 point review of systems negative.   Past Medical History:  Diagnosis Date  . Anxiety   . Bronchitis   . COPD (chronic obstructive pulmonary disease) ([place])   . Depression   . Hypertension   . Mental disorder   . MI (myocardial infarction) ([place])    ????  . OSA (obstructive sleep apnea)   . Suicide attempt ([place])   . Tension pneumothorax 06/27/2016    Past Surgical History:  Procedure Laterality Date  . CHEST TUBE INSERTION Left 06/27/2016  . cryptorchidism    . SKIN CANCER EXCISION       reports that he has been smoking cigarettes.  He has a 10.00 pack-year smoking history. He has never used smokeless tobacco. He reports that he does not drink alcohol or use drugs.  Allergies  Allergen Reactions  . Demerol [Meperidine] Nausea And  Vomiting    Violently sick  . Zocor [Simvastatin] Other (See Comments)    Made him very jittery, nausea and vomiting    Family History  Problem Relation Age of Onset  . Dementia Father      Prior to Admission medications   Medication Sig Start Date End Date Taking? Authorizing Provider  acetaminophen (TYLENOL) 325 MG tablet Take 975 mg by mouth every 8 (eight) hours as needed for mild pain, moderate pain or headache.   Yes [provider]  albuterol (PROAIR HFA) 108 (90 Base) MCG/ACT inhaler Inhale 2 puffs into the lungs every 6 (six) hours as needed for wheezing or shortness of breath.   Yes [provider]  aspirin EC 81 MG tablet Take 81 mg by mouth daily.   Yes [provider]  buPROPion (WELLBUTRIN XL) 300 MG 24 hr tablet Take 300 mg by mouth daily after breakfast.    Yes [provider]  cholecalciferol (VITAMIN D) 1000 units tablet Take 3,000 Units by mouth daily.   Yes [provider]  DULoxetine (CYMBALTA) 30 MG capsule Take 1 capsule (30 mg total) by mouth daily. Patient taking differently: Take 30-60 mg by mouth See admin instructions. Take 2 capsules (60 mg) by mouth every morning and 1 capsule (30 mg) at bedtime 07/21/14  Yes Evelina Bucy, MD  folic acid (FOLVITE) 1 MG tablet Take 1 mg by  mouth daily.   Yes [provider]  gabapentin (NEURONTIN) 300 MG capsule Take 2 capsules (600 mg total) by mouth 2 (two) times daily. Patient taking differently: Take 900 mg by mouth 3 (three) times daily.  07/06/16  Yes Tacy Dura, Donielle M, PA-C  Hypromellose (ARTIFICIAL TEARS OP) Place 1 drop into both eyes daily as needed (dry eyes).   Yes [provider]  Ipratropium-Albuterol (COMBIVENT RESPIMAT) 20-100 MCG/ACT AERS respimat Inhale 1-2 puffs into the lungs every 6 (six) hours as needed for wheezing.    Yes [provider]  lisinopril (PRINIVIL,ZESTRIL) 20 MG tablet Take 1 tablet (20 mg total) by mouth daily. For high  blood pressure Patient taking differently: Take 20 mg by mouth daily.  01/21/13  Yes Mashburn, Marlane Hatcher, PA-C  lurasidone (LATUDA) 20 MG TABS tablet Take 20 mg by mouth at bedtime.   Yes [provider]  metFORMIN (GLUCOPHAGE) 1000 MG tablet Take 1,000 mg by mouth 2 (two) times daily with a meal.   Yes [provider]  methotrexate (RHEUMATREX) 2.5 MG tablet Take 15 mg by mouth every Saturday. Caution:Chemotherapy. Protect from light.    Yes [provider]  mirtazapine (REMERON) 15 MG tablet Take 15 mg by mouth at bedtime.   Yes [provider]  oxyCODONE 10 MG TABS Take 10 mg by mouth every 4-6 hours PRN severe pain. Patient taking differently: Take 5-10 mg by mouth every 6 (six) hours as needed (pain).  07/06/16  Yes Lars Pinks M, PA-C  predniSONE (DELTASONE) 5 MG tablet Take 35 mg by mouth daily with breakfast.    Yes [provider]  PRESCRIPTION MEDICATION Take 1 application by mouth daily as needed (foot itching). Anti-fungal cream with moisturizer from [place]   Yes [provider]  [place] into the lungs at bedtime. CPAP   Yes [provider]  ranitidine (ZANTAC) 150 MG tablet Take 150 mg by mouth 2 (two) times daily.   Yes [provider]  sennosides-docusate sodium (SENOKOT-S) 8.6-50 MG tablet Take 1 tablet by mouth daily.    Yes [provider]  sodium chloride (OCEAN) 0.65 % SOLN nasal spray Place 2 sprays into both nostrils 4 (four) times daily as needed for congestion.   Yes [provider]  tiotropium (SPIRIVA) 18 MCG inhalation capsule Place 1 capsule (18 mcg total) into inhaler and inhale daily. 05/26/14  Yes Mikhail, Velta Addison, DO  traMADol (ULTRAM) 50 MG tablet Take 100 mg by mouth every 8 (eight) hours as needed for moderate pain or severe pain.   Yes [provider]    Physical Exam: Vitals:   07/15/17 2350 07/16/17 0058 07/16/17 0224 07/16/17 0232  BP:  115/72 (!) 145/94  135/85  Pulse: (!) 112 (!) 101  100  Resp: '20 18  17  ' SpO2: 95% 91% 93% (!) 89%    Constitutional: NAD, calm, comfortable Eyes: PERRL, lids and conjunctivae normal ENMT: Mucous membranes are moist. Posterior pharynx clear of any exudate or lesions.Normal dentition.  Neck: normal, supple, no masses, no thyromegaly Respiratory: clear to auscultation bilaterally, no wheezing, no crackles. Normal respiratory effort. No accessory muscle use.  Cardiovascular: Regular rate and rhythm, no murmurs / rubs / gallops. No extremity edema. 2+ pedal pulses. No carotid bruits.  Abdomen: no tenderness, no masses palpated. No hepatosplenomegaly. Bowel sounds positive.  Musculoskeletal: no clubbing / cyanosis. No joint deformity upper and lower extremities. Good ROM, no contractures. Normal muscle tone.  Skin:  Neurologic: CN 2-12 grossly intact. Sensation intact, DTR normal. Strength 5/5 in all 4.  Psychiatric: Normal judgment and insight. Alert and oriented x 3. Normal mood.    Labs on Admission: I have personally reviewed following labs and imaging studies  CBC: Recent Labs  Lab 07/16/17 0014  WBC 13.5*  NEUTROABS 11.0*  HGB 14.4  HCT 43.9  MCV 104.3*  PLT 875   Basic Metabolic Panel: Recent Labs  Lab 07/16/17 0014  NA 141  K 4.9  CL 105  CO2 23  GLUCOSE 139*  BUN 20  CREATININE 1.18  CALCIUM 9.4   GFR: CrCl cannot be calculated (Unknown ideal weight.). Liver Function Tests: No results for input(s): AST, ALT, ALKPHOS, BILITOT, PROT, ALBUMIN in the last 168 hours. No results for input(s): LIPASE, AMYLASE in the last 168 hours. No results for input(s): AMMONIA in the last 168 hours. Coagulation Profile: No results for input(s): INR, PROTIME in the last 168 hours. Cardiac Enzymes: No results for input(s): CKTOTAL, CKMB, CKMBINDEX, TROPONINI in the last 168 hours. BNP (last 3 results) No results for input(s): PROBNP in the last 8760 hours. HbA1C: No  results for input(s): HGBA1C in the last 72 hours. CBG: No results for input(s): GLUCAP in the last 168 hours. Lipid Profile: No results for input(s): CHOL, HDL, LDLCALC, TRIG, CHOLHDL, LDLDIRECT in the last 72 hours. Thyroid Function Tests: No results for input(s): TSH, T4TOTAL, FREET4, T3FREE, THYROIDAB in the last 72 hours. Anemia Panel: No results for input(s): VITAMINB12, FOLATE, FERRITIN, TIBC, IRON, RETICCTPCT in the last 72 hours. Urine analysis:    Component Value Date/Time   COLORURINE YELLOW 01/12/2013 0024   APPEARANCEUR CLEAR 01/12/2013 0024   LABSPEC 1.026 01/12/2013 0024   PHURINE 7.0 01/12/2013 0024   GLUCOSEU NEGATIVE 01/12/2013 0024   HGBUR NEGATIVE 01/12/2013 0024   BILIRUBINUR NEGATIVE 01/12/2013 0024   KETONESUR NEGATIVE 01/12/2013 0024   PROTEINUR NEGATIVE 01/12/2013 0024   UROBILINOGEN 1.0 01/12/2013 0024   NITRITE NEGATIVE 01/12/2013 0024   LEUKOCYTESUR NEGATIVE 01/12/2013 0024    Radiological Exams on Admission: No results found.  EKG: Independently reviewed.  Assessment/Plan Principal Problem:   Cellulitis of both feet Active Problems:   HTN (hypertension)   DM2 (diabetes mellitus, type 2) (HCC)   HLA B27 (HLA B27 positive)    1. Cellulitis of both feet - possibly bacterial cellulitis superimposed on what was previously a vasculitis?  Vasculitis now in remission thanks to daily prednisone? 1. Definitely sounds like someone thought it was infectious since they put him on course of keflex and had him hold MTX. 2. Will put on clindamycin IV 3. Skin and BCx pending 4. Trying to get records from [place] via Fax right now so we dont have to repeat what sounds like an extensive work up involving multiple specialists. 2. HLA-B27 - 1. Will continue prednisone for the moment. 3. HTN - Cont home meds 4. DM2 - 1. Cont home Lantus 12u QHS 2. Hold metformin 3. Sensitive SSI AC  DVT prophylaxis: Lovenox Code Status: Full Family Communication: No family in  room Disposition Plan: Home after admit Consults called: None Admission status: Admit to inpatient - failed outpatient ABx.   Etta Quill DO [facility] Pager [phone]  If 7AM-7PM, please contact day team taking care of patient [email or web address] Password [email or web address]  07/16/2017, 3:09 AM

## 2017-07-16 NOTE — Consult Note (Signed)
[facility] for Infectious Disease    Date of Admission:  07/15/2017   Total days of antibiotics: 1 vanco               Reason for Consult: vasculitis    Referring Provider: Mikhail   Assessment: LE wounds ? Vasculitis Cellulitis  Plan: 1. Treat as cellulitis on top of his vasculitis 2. Will change vanco to ancef as he improves.  3. Send BCx 4. Primary team obtain his records from [facility] PCP [facility] 5. We will follow with you.   Comment- The etiology of his wounds is unclear.  Getting his records and extensive [facility] w/u is paramount.   Thank you for the consult,  Principal Problem:   Cellulitis of both feet Active Problems:   HTN (hypertension)   DM2 (diabetes mellitus, type 2) (HCC)   HLA B27 (HLA B27 positive)   . aspirin EC  81 mg Oral Daily  . buPROPion  300 mg Oral QPC breakfast  . cholecalciferol  3,000 Units Oral Daily  . DULoxetine  30 mg Oral QHS  . DULoxetine  60 mg Oral Daily  . enoxaparin (LOVENOX) injection  40 mg Subcutaneous Q24H  . famotidine  20 mg Oral BID  . folic acid  1 mg Oral Daily  . gabapentin  900 mg Oral TID  . insulin aspart  0-9 Units Subcutaneous TID WC  . insulin glargine  12 Units Subcutaneous QHS  . lisinopril  20 mg Oral Daily  . lurasidone  20 mg Oral QHS  . mirtazapine  15 mg Oral QHS  . predniSONE  35 mg Oral Q breakfast  . senna-docusate  1 tablet Oral Daily  . silver sulfADIAZINE   Topical Daily  . tiotropium  18 mcg Inhalation Daily    HPI: Mario [name] is a 67 y.o. 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 He was hospitalized at [facility] ~ 6 weeks ago. He had Bx done at that time. The results of his w/u are unclear. He has prev been treated with mycophenolate, embrel, and methotrexate.He has been off these since his last hospitalization. He was treated with keflex for 1 month after d/c. He has had WOC f/u.   He comes to [facility]  on 5-18 with worsening pain after running out of his pain rx.   He has been afebrile, his WBC is 13.5.  He has a superficial Cx of his wounds sent today.  He denies f/c. He has had clear, yellow d/c from his wounds.   Review of Systems: Review of Systems  Constitutional: Positive for diaphoresis. Negative for chills and fever.  Respiratory: Positive for shortness of breath.   Gastrointestinal: Negative for constipation and diarrhea.  Genitourinary: Negative for dysuria.  Neurological: Positive for sensory change.  SOB from COPD. Prev neuropathy.  Please see HPI. All other systems reviewed and negative.   Past Medical History:  Diagnosis Date  . Anxiety   . Bronchitis   . COPD (chronic obstructive pulmonary disease) ([place])   . Depression   . Hypertension   . Mental disorder   . MI (myocardial infarction) ([place])    ????  . OSA (obstructive sleep apnea)   . Suicide attempt ([place])   . Tension pneumothorax 06/27/2016    Social History   Tobacco Use  . Smoking status: Current Every Day Smoker  Packs/day: 0.50    Years: 20.00    Pack years: 10.00    Types: Cigarettes  . Smokeless tobacco: Never Used  Substance Use Topics  . Alcohol use: No    Alcohol/week: 0.0 oz    Comment: denies use of any drugs or alcohol  . Drug use: No    Family History  Problem Relation Age of Onset  . Dementia Father      Medications:  Scheduled: . aspirin EC  81 mg Oral Daily  . buPROPion  300 mg Oral QPC breakfast  . cholecalciferol  3,000 Units Oral Daily  . DULoxetine  30 mg Oral QHS  . DULoxetine  60 mg Oral Daily  . enoxaparin (LOVENOX) injection  40 mg Subcutaneous Q24H  . famotidine  20 mg Oral BID  . folic acid  1 mg Oral Daily  . gabapentin  900 mg Oral TID  . insulin aspart  0-9 Units Subcutaneous TID WC  . insulin glargine  12 Units Subcutaneous QHS  . lisinopril  20 mg Oral Daily  . lurasidone  20 mg Oral QHS  . mirtazapine  15 mg Oral QHS  . predniSONE  35 mg Oral Q  breakfast  . senna-docusate  1 tablet Oral Daily  . silver sulfADIAZINE   Topical Daily  . tiotropium  18 mcg Inhalation Daily    Abtx:  Anti-infectives (From admission, onward)   Start     Dose/Rate Route Frequency Ordered Stop   07/17/17 1200  vancomycin (VANCOCIN) 2,000 mg in sodium chloride 0.9 % 500 mL IVPB     2,000 mg 250 mL/hr over 120 Minutes Intravenous Every 24 hours 07/16/17 1021     07/16/17 1100  vancomycin (VANCOCIN) 2,500 mg in sodium chloride 0.9 % 500 mL IVPB     2,500 mg 250 mL/hr over 120 Minutes Intravenous  Once 07/16/17 1021 07/16/17 1300   07/16/17 0930  vancomycin (VANCOCIN) IVPB 1000 mg/200 mL premix  Status:  Discontinued     1,000 mg 200 mL/hr over 60 Minutes Intravenous  Once 07/16/17 0922 07/16/17 1018   07/16/17 0600  clindamycin (CLEOCIN) IVPB 600 mg  Status:  Discontinued     600 mg 100 mL/hr over 30 Minutes Intravenous Every 8 hours 07/16/17 0238 07/16/17 0249   07/16/17 0600  clindamycin (CLEOCIN) IVPB 600 mg  Status:  Discontinued     600 mg 100 mL/hr over 30 Minutes Intravenous Every 8 hours 07/16/17 0256 07/16/17 0257   07/16/17 0300  doxycycline (VIBRAMYCIN) 100 mg in sodium chloride 0.9 % 250 mL IVPB  Status:  Discontinued     100 mg 125 mL/hr over 120 Minutes Intravenous Every 12 hours 07/16/17 0251 07/16/17 0251   07/16/17 0300  clindamycin (CLEOCIN) IVPB 600 mg  Status:  Discontinued     600 mg 100 mL/hr over 30 Minutes Intravenous Every 8 hours 07/16/17 0257 07/16/17 0808        OBJECTIVE: Blood pressure 140/85, pulse (!) 106, temperature 97.6 F (36.4 C), temperature source Oral, resp. rate 17, height '6\' 2"'  (1.88 m), weight (!) 149.4 kg (329 lb 6.4 oz), SpO2 94 %.  Physical Exam  Constitutional: He is oriented to person, place, and time. He appears well-developed and well-nourished.  HENT:  Mouth/Throat: No oropharyngeal exudate.  Eyes: Pupils are equal, round, and reactive to light. EOM are normal.  Neck: Normal range of  motion. Neck supple.  Cardiovascular: Normal rate, regular rhythm and normal heart sounds.  Pulmonary/Chest: Effort normal and breath  sounds normal.  Abdominal: Soft. Bowel sounds are normal. There is no tenderness.  Neurological: He is alert and oriented to person, place, and time. A sensory deficit is present.  Skin: There is erythema.       Lab Results Results for orders placed or performed during the hospital encounter of 07/15/17 (from the past 48 hour(s))  CBC with Differential/Platelet     Status: Abnormal   Collection Time: 07/16/17 12:14 AM  Result Value Ref Range   WBC 13.5 (H) 4.0 - 10.5 K/uL   RBC 4.21 (L) 4.22 - 5.81 MIL/uL   Hemoglobin 14.4 13.0 - 17.0 g/dL   HCT 43.9 39.0 - 52.0 %   MCV 104.3 (H) 78.0 - 100.0 fL   MCH 34.2 (H) 26.0 - 34.0 pg   MCHC 32.8 30.0 - 36.0 g/dL   RDW 14.5 11.5 - 15.5 %   Platelets 307 150 - 400 K/uL   Neutrophils Relative % 82 %   Lymphocytes Relative 13 %   Monocytes Relative 5 %   Eosinophils Relative 0 %   Basophils Relative 0 %   Neutro Abs 11.0 (H) 1.7 - 7.7 K/uL   Lymphs Abs 1.8 0.7 - 4.0 K/uL   Monocytes Absolute 0.7 0.1 - 1.0 K/uL   Eosinophils Absolute 0.0 0.0 - 0.7 K/uL   Basophils Absolute 0.0 0.0 - 0.1 K/uL   Smear Review MORPHOLOGY UNREMARKABLE     Comment: Performed at [facility], [place] [address], [place], [place] [address]  Basic metabolic panel     Status: Abnormal   Collection Time: 07/16/17 12:14 AM  Result Value Ref Range   Sodium 141 135 - 145 mmol/L   Potassium 4.9 3.5 - 5.1 mmol/L   Chloride 105 101 - 111 mmol/L   CO2 23 22 - 32 mmol/L   Glucose, Bld 139 (H) 65 - 99 mg/dL   BUN 20 6 - 20 mg/dL   Creatinine, Ser 1.18 0.61 - 1.24 mg/dL   Calcium 9.4 8.9 - 10.3 mg/dL   GFR calc non Af Amer >60 >60 mL/min   GFR calc Af Amer >60 >60 mL/min    Comment: (NOTE) The eGFR has been calculated using the CKD EPI equation. This calculation has not been validated in all clinical situations. eGFR's  persistently <60 mL/min signify possible Chronic Kidney Disease.    Anion gap 13 5 - 15    Comment: Performed at [facility], [place] [address] [place], [place] [address]  Wound or Superficial Culture     Status: None (Preliminary result)   Collection Time: 07/16/17 12:14 AM  Result Value Ref Range   Specimen Description      FOOT RIGHT Performed at [facility], [place] [address], [place], [place] [address]    Special Requests      NONE Performed at [facility], [place] [address], [place], [place] [address]    Gram Stain      NO WBC SEEN NO ORGANISMS SEEN Performed at [facility], [place] [address], [place], [place] [address]    Culture PENDING    Report Status PENDING   Glucose, capillary     Status: Abnormal   Collection Time: 07/16/17  7:20 AM  Result Value Ref Range   Glucose-Capillary 100 (H) 65 - 99 mg/dL  Glucose, capillary     Status: Abnormal   Collection Time: 07/16/17 11:35 AM  Result Value Ref Range   Glucose-Capillary 164 (H) 65 -  99 mg/dL      Component Value Date/Time   SDES  07/16/2017 0014    FOOT RIGHT Performed at [facility] [address], [place], [place] [address]    SPECREQUEST  07/16/2017 0014    NONE Performed at [facility], [place] [address], [place], [place] [address]    CULT PENDING 07/16/2017 0014   REPTSTATUS PENDING 07/16/2017 0014   No results found. Recent Results (from the past 240 hour(s))  Wound or Superficial Culture     Status: None (Preliminary result)   Collection Time: 07/16/17 12:14 AM  Result Value Ref Range Status   Specimen Description   Final    FOOT RIGHT Performed at [facility], [address] [place], [place] [address]    Special Requests   Final    NONE Performed at [facility], [place] [address], [place], [place] [address]    Gram Stain   Final    NO WBC SEEN NO ORGANISMS SEEN Performed at [facility], [place] [address], [place], [place] [address]    Culture PENDING  Incomplete   Report Status PENDING  Incomplete    Microbiology: Recent Results (from the past 240 hour(s))  Wound or Superficial Culture     Status: None (Preliminary result)   Collection Time: 07/16/17 12:14 AM  Result Value Ref Range Status   Specimen Description   Final    FOOT RIGHT Performed at [facility], [address], [place], [place] [address]    Special Requests   Final    NONE Performed at [facility], [place] [address] [place], [place] [address]    Gram Stain   Final    NO WBC SEEN NO ORGANISMS SEEN Performed at [facility], [place] [address], [place], [place] [address]    Culture PENDING  Incomplete   Report Status PENDING  Incomplete    Radiographs and labs were personally reviewed by me.   Bobby Rumpf, MD [facility] [place] [facility] [phone] 07/16/2017, 3:06 PM

## 2017-07-16 NOTE — Progress Notes (Signed)
PROGRESS NOTE    Mario [name]  XJO:[id number] DOB: 12/02/1950 DOA: 07/15/2017 PCP: [facility]   Brief Narrative:  HPI on 07/16/2017 by Dr. Jennette Kettle Mario [name] is a 67 y.o. male with medical history significant of HLA-B27, uveitis.  Patient is normally on prednisone and MTX.  Patient developed painful red bumps on forearms and feet in Jan-Feb.  Sounds like he was treated with prednisone, MTX, and possibly mycophenolate for a time (not currently).  Bumps on forearm went away but developed weeping sores on feet.   His diagnostic work up was at the [place] and has included Derm consult, Rheum consult, and skin biopsy x3. 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 These have persisted despite a recent course of keflex. He was told to hold MTX while he was on Keflex. So this has been on hold. Feet are severely painful.  He is out of pain meds.  Assessment & Plan   Lower extremity cellulitis -Possibly bacterial superimposed on a questionable vasculitis -Patient was initially placed on Keflex as an outpatient.  Has been dealing with this since January 2019.  Has been going to the [facility] for his care.  Patient has seen dermatology and rheumatology has also had skin biopsy.  States he was supposed to be transferred to [facility] from the [place] however this did not occur. -Patient initially placed on clindamycin however given his age and history of C. difficile colitis, will place him on vancomycin -Pending records from the [place]. -Wound culture pending; shows no organisms on Gram stain -Continue pain control.  Patient currently asking for IV Dilaudid and states oxycodone is currently not helping him.  HLA-B 27 -Continue prednisone  Essential hypertension -Continue lisinopril  Diabetes mellitus, type II with neuropathy -Continue Lantus, insulin sliding scale and CBG monitoring -Continue gabapentin for neuropathy  Depression/bipolar disorder -Continue  Cymbalta, Remeron, Latuda, and Wellbutrin  DVT Prophylaxis Lovenox  Code Status: Full  Family Communication: None at bedside  Disposition Plan: Admitted. Dispo pending  Consultants None  Procedures  None  Antibiotics   Anti-infectives (From admission, onward)   Start     Dose/Rate Route Frequency Ordered Stop   07/17/17 1200  vancomycin (VANCOCIN) 2,000 mg in sodium chloride 0.9 % 500 mL IVPB     2,000 mg 250 mL/hr over 120 Minutes Intravenous Every 24 hours 07/16/17 1021     07/16/17 1100  vancomycin (VANCOCIN) 2,500 mg in sodium chloride 0.9 % 500 mL IVPB     2,500 mg 250 mL/hr over 120 Minutes Intravenous  Once 07/16/17 1021     07/16/17 0930  vancomycin (VANCOCIN) IVPB 1000 mg/200 mL premix  Status:  Discontinued     1,000 mg 200 mL/hr over 60 Minutes Intravenous  Once 07/16/17 0922 07/16/17 1018   07/16/17 0600  clindamycin (CLEOCIN) IVPB 600 mg  Status:  Discontinued     600 mg 100 mL/hr over 30 Minutes Intravenous Every 8 hours 07/16/17 0238 07/16/17 0249   07/16/17 0600  clindamycin (CLEOCIN) IVPB 600 mg  Status:  Discontinued     600 mg 100 mL/hr over 30 Minutes Intravenous Every 8 hours 07/16/17 0256 07/16/17 0257   07/16/17 0300  doxycycline (VIBRAMYCIN) 100 mg in sodium chloride 0.9 % 250 mL IVPB  Status:  Discontinued     100 mg 125 mL/hr over 120 Minutes Intravenous Every 12 hours 07/16/17 0251 07/16/17 0251   07/16/17 0300  clindamycin (CLEOCIN) IVPB 600 mg  Status:  Discontinued  600 mg 100 mL/hr over 30 Minutes Intravenous Every 8 hours 07/16/17 0257 07/16/17 8416      Subjective:   Mario [name] seen and examined today.  Continues to complain of pain and would like Dilaudid.  States that his pain was uncontrolled at the [facility] and did not feel that they were doing enough for him as far as his care goes.  States he was supposed to go to [facility].  Currently denies chest pain, shortness of breath, abdominal pain, nausea vomiting,  diarrhea or constipation.  Objective:   Vitals:   07/16/17 0232 07/16/17 0554 07/16/17 0621 07/16/17 1011  BP: 135/85 140/90 124/80   Pulse: 100 100 89   Resp: '17 19 16   ' Temp:  98.8 F (37.1 C) (!) 97.5 F (36.4 C)   TempSrc:  Oral Oral   SpO2: (!) 89% 93% 98%   Weight:    (!) 149.4 kg (329 lb 6.4 oz)  Height:    '6\' 2"'  (1.88 m)    Intake/Output Summary (Last 24 hours) at 07/16/2017 1245 Last data filed at 07/16/2017 0853 Gross per 24 hour  Intake 240 ml  Output -  Net 240 ml   Filed Weights   07/16/17 1011  Weight: (!) 149.4 kg (329 lb 6.4 oz)    Exam  General: Well developed, well nourished, NAD, appears stated age  HEENT: NCAT, mucous membranes moist.   Extremities: warm dry without cyanosis clubbing. LE ext with dressings in place  Neuro: AAOx3, nonfocal  Psych: appropriate mood and affect   Data Reviewed: I have personally reviewed following labs and imaging studies  CBC: Recent Labs  Lab 07/16/17 0014  WBC 13.5*  NEUTROABS 11.0*  HGB 14.4  HCT 43.9  MCV 104.3*  PLT 606   Basic Metabolic Panel: Recent Labs  Lab 07/16/17 0014  NA 141  K 4.9  CL 105  CO2 23  GLUCOSE 139*  BUN 20  CREATININE 1.18  CALCIUM 9.4   GFR: Estimated Creatinine Clearance: 96.3 mL/min (by C-G formula based on SCr of 1.18 mg/dL). Liver Function Tests: No results for input(s): AST, ALT, ALKPHOS, BILITOT, PROT, ALBUMIN in the last 168 hours. No results for input(s): LIPASE, AMYLASE in the last 168 hours. No results for input(s): AMMONIA in the last 168 hours. Coagulation Profile: No results for input(s): INR, PROTIME in the last 168 hours. Cardiac Enzymes: No results for input(s): CKTOTAL, CKMB, CKMBINDEX, TROPONINI in the last 168 hours. BNP (last 3 results) No results for input(s): PROBNP in the last 8760 hours. HbA1C: No results for input(s): HGBA1C in the last 72 hours. CBG: Recent Labs  Lab 07/16/17 0720 07/16/17 1135  GLUCAP 100* 164*   Lipid  Profile: No results for input(s): CHOL, HDL, LDLCALC, TRIG, CHOLHDL, LDLDIRECT in the last 72 hours. Thyroid Function Tests: No results for input(s): TSH, T4TOTAL, FREET4, T3FREE, THYROIDAB in the last 72 hours. Anemia Panel: No results for input(s): VITAMINB12, FOLATE, FERRITIN, TIBC, IRON, RETICCTPCT in the last 72 hours. Urine analysis:    Component Value Date/Time   COLORURINE YELLOW 01/12/2013 0024   APPEARANCEUR CLEAR 01/12/2013 0024   LABSPEC 1.026 01/12/2013 0024   PHURINE 7.0 01/12/2013 0024   GLUCOSEU NEGATIVE 01/12/2013 0024   HGBUR NEGATIVE 01/12/2013 0024   BILIRUBINUR NEGATIVE 01/12/2013 0024   KETONESUR NEGATIVE 01/12/2013 0024   PROTEINUR NEGATIVE 01/12/2013 0024   UROBILINOGEN 1.0 01/12/2013 0024   NITRITE NEGATIVE 01/12/2013 0024   LEUKOCYTESUR NEGATIVE 01/12/2013 0024   Sepsis  Labs: '@[id number]' (procalcitonin:4,lacticidven:4)  ) Recent Results (from the past 240 hour(s))  Wound or Superficial Culture     Status: None (Preliminary result)   Collection Time: 07/16/17 12:14 AM  Result Value Ref Range Status   Specimen Description   Final    FOOT RIGHT Performed at [facility], [address], [place], [place] [address]    Special Requests   Final    NONE Performed at [facility], [place] [address] [place], [place] [address]    Gram Stain   Final    NO WBC SEEN NO ORGANISMS SEEN Performed at [facility], [place] [address] [place], [place] [address]    Culture PENDING  Incomplete   Report Status PENDING  Incomplete      Radiology Studies: No results found.   Scheduled Meds: . aspirin EC  81 mg Oral Daily  . buPROPion  300 mg Oral QPC breakfast  . cholecalciferol  3,000 Units Oral Daily  . DULoxetine  30 mg Oral QHS  . DULoxetine  60 mg Oral Daily  . enoxaparin (LOVENOX) injection  40 mg Subcutaneous Q24H  . famotidine  20 mg Oral BID  . folic acid  1 mg Oral Daily  . gabapentin  900 mg Oral TID  . insulin aspart   0-9 Units Subcutaneous TID WC  . insulin glargine  12 Units Subcutaneous QHS  . lisinopril  20 mg Oral Daily  . lurasidone  20 mg Oral QHS  . mirtazapine  15 mg Oral QHS  . predniSONE  35 mg Oral Q breakfast  . senna-docusate  1 tablet Oral Daily  . tiotropium  18 mcg Inhalation Daily   Continuous Infusions: . [START ON 07/17/2017] vancomycin    . vancomycin 2,500 mg (07/16/17 1049)     LOS: 0 days   Time Spent in minutes   30 minutes  Vandy Fong D.O. on 07/16/2017 at 12:45 PM  Between 7am to 7pm - Pager - [phone]  After 7pm go to [email or web address] - password [email or web address]  And look for the night coverage person covering for me after hours  [facility] Office  [phone]

## 2017-07-16 NOTE — ED Notes (Signed)
ED TO INPATIENT HANDOFF REPORT  Name/Age/Gender Mario [name] 67 y.o. male  Code Status    Code Status Orders  (From admission, onward)        Start     Ordered   07/16/17 0309  Full code  Continuous     07/16/17 0309    Code Status History    Date Active Date Inactive Code Status Order ID Comments User Context   09/17/2016 2107 09/18/2016 2055 Full Code [id number]  Lovenia Kim, MD ED   06/27/2016 1523 07/07/2016 1718 Full Code [id number]  Nani Skillern, PA-C ED   05/23/2014 2356 05/26/2014 1656 Full Code [id number]  Jani Gravel, MD Inpatient   01/21/2013 2202 01/23/2013 2202 Full Code [id number]  Martie Lee, PA-C ED   01/12/2013 0020 01/12/2013 0641 Full Code [id number]  Margarita Mail, PA-C ED   01/10/2013 0122 01/11/2013 2122 Full Code [id number]  Rise Patience, MD Inpatient      Home/SNF/Other Home  Chief Complaint foot pains  Level of Care/Admitting Diagnosis ED Disposition    ED Disposition Condition [place] [facility] Area: [facility] [[id number]]  Level of Care: Med-Surg [16]  Diagnosis: Cellulitis of both feet [8416606]  Admitting Physician: Etta Quill [[id number]]  Attending Physician: Etta Quill [[id number]]  Estimated length of stay: past midnight tomorrow  Certification:: I certify this patient will need inpatient services for at least 2 midnights  PT Class (Do Not Modify): Inpatient [101]  PT Acc Code (Do Not Modify): Private [1]       Medical History Past Medical History:  Diagnosis Date  . Anxiety   . Bronchitis   . COPD (chronic obstructive pulmonary disease) ([place])   . Depression   . Hypertension   . Mental disorder   . MI (myocardial infarction) ([place])    ????  . OSA (obstructive sleep apnea)   . Suicide attempt ([place])   . Tension pneumothorax 06/27/2016    Allergies Allergies  Allergen Reactions  . Demerol [Meperidine] Nausea And Vomiting    Violently sick  . Zocor [Simvastatin] Other (See  Comments)    Made him very jittery, nausea and vomiting    IV Location/Drains/Wounds Patient Lines/Drains/Airways Status   Active Line/Drains/Airways    Name:   Placement date:   Placement time:   Site:   Days:   Wound / Incision (Open or Dehisced) 01/08/14 Other (Comment) Ear Right Cut to RT external ear. bleeding controoled on arrival   01/08/14    0824    Ear   1285          Labs/Imaging Results for orders placed or performed during the hospital encounter of 07/15/17 (from the past 48 hour(s))  CBC with Differential/Platelet     Status: Abnormal   Collection Time: 07/16/17 12:14 AM  Result Value Ref Range   WBC 13.5 (H) 4.0 - 10.5 K/uL   RBC 4.21 (L) 4.22 - 5.81 MIL/uL   Hemoglobin 14.4 13.0 - 17.0 g/dL   HCT 43.9 39.0 - 52.0 %   MCV 104.3 (H) 78.0 - 100.0 fL   MCH 34.2 (H) 26.0 - 34.0 pg   MCHC 32.8 30.0 - 36.0 g/dL   RDW 14.5 11.5 - 15.5 %   Platelets 307 150 - 400 K/uL   Neutrophils Relative % 82 %   Lymphocytes Relative 13 %   Monocytes Relative 5 %   Eosinophils Relative 0 %   Basophils Relative 0 %   Neutro Abs  11.0 (H) 1.7 - 7.7 K/uL   Lymphs Abs 1.8 0.7 - 4.0 K/uL   Monocytes Absolute 0.7 0.1 - 1.0 K/uL   Eosinophils Absolute 0.0 0.0 - 0.7 K/uL   Basophils Absolute 0.0 0.0 - 0.1 K/uL   Smear Review MORPHOLOGY UNREMARKABLE     Comment: Performed at [facility], [place] [address], [place], [place] [address]  Basic metabolic panel     Status: Abnormal   Collection Time: 07/16/17 12:14 AM  Result Value Ref Range   Sodium 141 135 - 145 mmol/L   Potassium 4.9 3.5 - 5.1 mmol/L   Chloride 105 101 - 111 mmol/L   CO2 23 22 - 32 mmol/L   Glucose, Bld 139 (H) 65 - 99 mg/dL   BUN 20 6 - 20 mg/dL   Creatinine, Ser 1.18 0.61 - 1.24 mg/dL   Calcium 9.4 8.9 - 10.3 mg/dL   GFR calc non Af Amer >60 >60 mL/min   GFR calc Af Amer >60 >60 mL/min    Comment: (NOTE) The eGFR has been calculated using the CKD EPI equation. This calculation has not been  validated in all clinical situations. eGFR's persistently <60 mL/min signify possible Chronic Kidney Disease.    Anion gap 13 5 - 15    Comment: Performed at [facility], [place] [address] [place], [place] [address]   No results found.  Pending Labs Unresulted Labs (From admission, onward)   Start     Ordered   07/16/17 0014  Wound or Superficial Culture  Once,   STAT     07/16/17 0015      Vitals/Pain Today's Vitals   07/16/17 0219 07/16/17 0224 07/16/17 0232 07/16/17 0554  BP:   135/85 140/90  Pulse:   100 100  Resp:   17 19  Temp:    98.8 F (37.1 C)  TempSrc:    Oral  SpO2:  93% (!) 89% 93%  PainSc: 9    8     Isolation Precautions No active isolations  Medications Medications  lidocaine (XYLOCAINE) 5 % ointment (1 application Topical Given 07/16/17 0219)  oxyCODONE-acetaminophen (PERCOCET) 7.5-325 MG per tablet 1-2 tablet (has no administration in time range)  lurasidone (LATUDA) tablet 20 mg (has no administration in time range)  lisinopril (PRINIVIL,ZESTRIL) tablet 20 mg (has no administration in time range)  polyvinyl alcohol (LIQUIFILM TEARS) 1.4 % ophthalmic solution 1 drop (has no administration in time range)  gabapentin (NEURONTIN) capsule 900 mg (has no administration in time range)  predniSONE (DELTASONE) tablet 35 mg (has no administration in time range)  clindamycin (CLEOCIN) IVPB 600 mg (has no administration in time range)  albuterol (PROVENTIL) (2.5 MG/3ML) 0.083% nebulizer solution 3 mL (has no administration in time range)  aspirin EC tablet 81 mg (has no administration in time range)  buPROPion (WELLBUTRIN XL) 24 hr tablet 300 mg (has no administration in time range)  DULoxetine (CYMBALTA) DR capsule 30-60 mg (has no administration in time range)  cholecalciferol (VITAMIN D) tablet 3,000 Units (has no administration in time range)  folic acid (FOLVITE) tablet 1 mg (has no administration in time range)  mirtazapine (REMERON)  tablet 15 mg (has no administration in time range)  Ipratropium-Albuterol (COMBIVENT) respimat 1-2 puff (has no administration in time range)  traMADol (ULTRAM) tablet 100 mg (has no administration in time range)  tiotropium (SPIRIVA) inhalation capsule 18 mcg (has no administration in time range)  sennosides-docusate sodium (SENOKOT-S) 8.6-50 MG tablet 1 tablet (has no  administration in time range)  famotidine (PEPCID) tablet 20 mg (has no administration in time range)  sodium chloride (OCEAN) 0.65 % nasal spray 2 spray (has no administration in time range)  insulin glargine (LANTUS) injection 12 Units (has no administration in time range)  insulin aspart (novoLOG) injection 0-9 Units (has no administration in time range)  acetaminophen (TYLENOL) tablet 650 mg (has no administration in time range)    Or  acetaminophen (TYLENOL) suppository 650 mg (has no administration in time range)  ondansetron (ZOFRAN) tablet 4 mg (has no administration in time range)    Or  ondansetron (ZOFRAN) injection 4 mg (has no administration in time range)  enoxaparin (LOVENOX) injection 40 mg (has no administration in time range)  sodium chloride 0.9 % bolus 1,000 mL (0 mLs Intravenous Stopped 07/16/17 0245)  ondansetron (ZOFRAN) injection 4 mg (4 mg Intravenous Given 07/16/17 0108)  HYDROmorphone (DILAUDID) injection 1 mg (1 mg Intravenous Given 07/16/17 0107)    Mobility non-ambulatory currently bilateral leg wounds

## 2017-07-16 NOTE — Consult Note (Signed)
[facility] Nurse wound consult note Reason for Consult: BLE wounds  Wound type: Possible vasculitis, possible bacterial cellulitis per Dr. Juleen China note 07/16/17 at 0309 a.m. POA: Yes Patient did not want the wraps placed in the ED removed.  He insists that once the wraps are removed he wants lidocaine gel applied. I am hesitant to use lidocaine topically due to the possible cardiac implications given the extent of the wounds represented in the photos taken in our ED. We discussed the use of Silvadene cream, non-adherent gauze or telfa pads, and kerlex wraps daily.  He is in agreement with this.  I have spoken with Dr. Ree Kida via phone about the topical approach with silvadene.  She is in agreement with this plan.  She also agrees to avoid the lidocaine gel.  I have recommended to her consulting Infectious Diseases and Plastic Surgery.  She will follow through as she deems appropriate. Monitor the wound area(s) for worsening of condition such as: Signs/symptoms of infection,  Increase in size,  Development of or worsening of odor, Development of pain, or increased pain at the affected locations.  Notify the medical team if any of these develop.  Thank you for the consult.  Discussed plan of care with the patient and bedside nurse.  [facility] nurse will not follow at this time.  Please re-consult the [facility] team if needed.  Val Riles, RN, MSN, CWOCN, CNS-BC, pager [phone]

## 2017-07-17 ENCOUNTER — Encounter (HOSPITAL_COMMUNITY): Payer: Self-pay | Admitting: Physician Assistant

## 2017-07-17 DIAGNOSIS — E118 Type 2 diabetes mellitus with unspecified complications: Secondary | ICD-10-CM

## 2017-07-17 DIAGNOSIS — Z1589 Genetic susceptibility to other disease: Secondary | ICD-10-CM

## 2017-07-17 DIAGNOSIS — I10 Essential (primary) hypertension: Secondary | ICD-10-CM

## 2017-07-17 LAB — BASIC METABOLIC PANEL
Anion gap: 8 (ref 5–15)
BUN: 16 mg/dL (ref 6–20)
CO2: 28 mmol/L (ref 22–32)
Calcium: 8.8 mg/dL — ABNORMAL LOW (ref 8.9–10.3)
Chloride: 105 mmol/L (ref 101–111)
Creatinine, Ser: 0.98 mg/dL (ref 0.61–1.24)
GFR calc Af Amer: >60 mL/min (ref >60)
GFR calc non Af Amer: >60 mL/min (ref >60)
Glucose, Bld: 140 mg/dL — ABNORMAL HIGH (ref 65–99)
Potassium: 4.2 mmol/L (ref 3.5–5.1)
Sodium: 141 mmol/L (ref 135–145)

## 2017-07-17 LAB — CBC
HCT: 39.7 % (ref 39.0–52.0)
Hemoglobin: 12.9 g/dL — ABNORMAL LOW (ref 13.0–17.0)
MCH: 34 pg (ref 26.0–34.0)
MCHC: 32.5 g/dL (ref 30.0–36.0)
MCV: 104.7 fL — ABNORMAL HIGH (ref 78.0–100.0)
Platelets: 256 10*3/uL (ref 150–400)
RBC: 3.79 MIL/uL — ABNORMAL LOW (ref 4.22–5.81)
RDW: 14.2 % (ref 11.5–15.5)
WBC: 13.7 10*3/uL — ABNORMAL HIGH (ref 4.0–10.5)

## 2017-07-17 LAB — GLUCOSE, CAPILLARY
Glucose-Capillary: 95 mg/dL (ref 65–99)
Glucose-Capillary: 145 mg/dL — ABNORMAL HIGH (ref 65–99)
Glucose-Capillary: 138 mg/dL — ABNORMAL HIGH (ref 65–99)
Glucose-Capillary: 147 mg/dL — ABNORMAL HIGH (ref 65–99)

## 2017-07-17 MED ORDER — HYDROMORPHONE HCL 1 MG/ML IJ SOLN
1.0000 mg | Freq: Once | INTRAMUSCULAR | Status: AC
  Administered 2017-07-17: 1 mg via INTRAVENOUS
  Filled 2017-07-17: qty 1

## 2017-07-17 MED ORDER — TRAMADOL HCL 50 MG PO TABS
100.0000 mg | ORAL_TABLET | Freq: Three times a day (TID) | ORAL | Status: DC | PRN
  Administered 2017-07-17 – 2017-07-20 (×10): 100 mg via ORAL
  Filled 2017-07-17 (×11): qty 2

## 2017-07-17 MED ORDER — IPRATROPIUM-ALBUTEROL 0.5-2.5 (3) MG/3ML IN SOLN: 3.0000 mL | Freq: Four times a day (QID) | RESPIRATORY_TRACT | Status: DC | PRN

## 2017-07-17 MED ORDER — OXYCODONE-ACETAMINOPHEN 7.5-325 MG PO TABS
1.0000 | ORAL_TABLET | Freq: Four times a day (QID) | ORAL | Status: DC | PRN
  Administered 2017-07-17 – 2017-07-21 (×16): 2 via ORAL
  Filled 2017-07-17 (×18): qty 2

## 2017-07-17 NOTE — Progress Notes (Signed)
Subjective: Thinks right leg is improving as far as pain Antibiotics:  Anti-infectives (From admission, onward)   Start     Dose/Rate Route Frequency Ordered Stop   07/17/17 1200  vancomycin (VANCOCIN) 2,000 mg in sodium chloride 0.9 % 500 mL IVPB     2,000 mg 250 mL/hr over 120 Minutes Intravenous Every 24 hours 07/16/17 1021     07/16/17 1100  vancomycin (VANCOCIN) 2,500 mg in sodium chloride 0.9 % 500 mL IVPB     2,500 mg 250 mL/hr over 120 Minutes Intravenous  Once 07/16/17 1021 07/16/17 1300   07/16/17 0930  vancomycin (VANCOCIN) IVPB 1000 mg/200 mL premix  Status:  Discontinued     1,000 mg 200 mL/hr over 60 Minutes Intravenous  Once 07/16/17 0922 07/16/17 1018   07/16/17 0600  clindamycin (CLEOCIN) IVPB 600 mg  Status:  Discontinued     600 mg 100 mL/hr over 30 Minutes Intravenous Every 8 hours 07/16/17 0238 07/16/17 0249   07/16/17 0600  clindamycin (CLEOCIN) IVPB 600 mg  Status:  Discontinued     600 mg 100 mL/hr over 30 Minutes Intravenous Every 8 hours 07/16/17 0256 07/16/17 0257   07/16/17 0300  doxycycline (VIBRAMYCIN) 100 mg in sodium chloride 0.9 % 250 mL IVPB  Status:  Discontinued     100 mg 125 mL/hr over 120 Minutes Intravenous Every 12 hours 07/16/17 0251 07/16/17 0251   07/16/17 0300  clindamycin (CLEOCIN) IVPB 600 mg  Status:  Discontinued     600 mg 100 mL/hr over 30 Minutes Intravenous Every 8 hours 07/16/17 0257 07/16/17 0808      Medications: Scheduled Meds: . aspirin EC  81 mg Oral Daily  . buPROPion  300 mg Oral QPC breakfast  . cholecalciferol  3,000 Units Oral Daily  . DULoxetine  30 mg Oral QHS  . DULoxetine  60 mg Oral Daily  . enoxaparin (LOVENOX) injection  40 mg Subcutaneous Q24H  . famotidine  20 mg Oral BID  . folic acid  1 mg Oral Daily  . gabapentin  900 mg Oral TID  . insulin aspart  0-9 Units Subcutaneous TID WC  . insulin glargine  12 Units Subcutaneous QHS  . lisinopril  20 mg Oral Daily  . lurasidone  20 mg Oral QHS    . mirtazapine  15 mg Oral QHS  . predniSONE  35 mg Oral Q breakfast  . senna-docusate  1 tablet Oral Daily  . silver sulfADIAZINE   Topical Daily  . tiotropium  18 mcg Inhalation Daily   Continuous Infusions: . vancomycin     PRN Meds:.acetaminophen **OR** acetaminophen, albuterol, ipratropium-albuterol, lidocaine, ondansetron **OR** ondansetron (ZOFRAN) IV, oxyCODONE-acetaminophen, polyvinyl alcohol, sodium chloride, traMADol    Objective: Weight change:   Intake/Output Summary (Last 24 hours) at 07/17/2017 0958 Last data filed at 07/17/2017 0815 Gross per 24 hour  Intake 600 ml  Output -  Net 600 ml   Blood pressure 118/87, pulse 92, temperature (!) 97.5 F (36.4 C), temperature source Oral, resp. rate 16, height '6\' 2"'  (1.88 m), weight 146 lb (66.2 kg), SpO2 93 %. Temp:  [97.5 F (36.4 C)-97.9 F (36.6 C)] 97.5 F (36.4 C) (05/20 0450) Pulse Rate:  [92-106] 92 (05/20 0450) Resp:  [16-18] 16 (05/20 0450) BP: (118-140)/(82-87) 118/87 (05/20 0450) SpO2:  [93 %-97 %] 93 % (05/20 0932) Weight:  [146 lb (66.2 kg)-329 lb 6.4 oz (149.4 kg)] 146 lb (66.2 kg) (05/20 0800)  Physical Exam: General:  Alert and awake, oriented x3, not in any acute distress. HEENT: anicteric sclera,EOMI CVS regular rate, normal r,   Chest: no wheezing, resp distress Abdomen: soft  nondistended,  Extremities: legs wrapped in dressings Neuro: nonfocal  CBC:   BMET Recent Labs    07/16/17 0014 07/17/17 0533  NA 141 141  K 4.9 4.2  CL 105 105  CO2 23 28  GLUCOSE 139* 140*  BUN 20 16  CREATININE 1.18 0.98  CALCIUM 9.4 8.8*     Liver Panel  No results for input(s): PROT, ALBUMIN, AST, ALT, ALKPHOS, BILITOT, BILIDIR, IBILI in the last 72 hours.     Sedimentation Rate No results for input(s): ESRSEDRATE in the last 72 hours. C-Reactive Protein No results for input(s): CRP in the last 72 hours.  Micro Results: Recent Results (from the past 720 hour(s))  Wound or Superficial  Culture     Status: None (Preliminary result)   Collection Time: 07/16/17 12:14 AM  Result Value Ref Range Status   Specimen Description   Final    FOOT RIGHT Performed at [facility], [address] [place], [place] [address]    Special Requests   Final    NONE Performed at [facility], [place] [address], [place], [place] [address]    Gram Stain   Final    NO WBC SEEN NO ORGANISMS SEEN Performed at [facility], [place] [address], [place], [place] [address]    Culture PENDING  Incomplete   Report Status PENDING  Incomplete  Culture, blood (Routine X 2) w Reflex to ID Panel     Status: None (Preliminary result)   Collection Time: 07/16/17  3:59 PM  Result Value Ref Range Status   Specimen Description   Final    BLOOD RIGHT HAND Performed at [facility], [place] [address], [place], [place] [address]    Special Requests   Final    BOTTLES DRAWN AEROBIC ONLY Blood Culture adequate volume Performed at [place] [address] [place], [place] [address]    Culture PENDING  Incomplete   Report Status PENDING  Incomplete    Studies/Results: No results found.    Assessment/Plan:  INTERVAL HISTORY: pt with some improvement pain wise esp in RLE since change to vancomycin and altering of pain meds   Principal Problem:   Cellulitis of both feet Active Problems:   HTN (hypertension)   DM2 (diabetes mellitus, type 2) (HCC)   HLA B27 (HLA B27 positive)    Mario [name] is a 67 y.o. male with  Vasculitis recently DC from [place] with treatment for vasculitis and superimposed cellulitis on prednisone and keflex admitted with worsening pain R> L leg, erythema, changed to vancomcyin  #1 ? Superimposed cellulitis: fine to continue vancomycin for now  #2 Vasculitis: would be best served by transfer [facility] to [facility]  I will sign off for now please call with further questions.   LOS: 1 day   Alcide Evener 07/17/2017,  9:58 AM

## 2017-07-17 NOTE — Consult Note (Signed)
Reason for Consult: Bilateral lower leg ulcers Referring Physician: Dr. Curly Shores, Hospitalist Service, [place] Date: 07/17/2017  Mario [name] is an 67 y.o. male.  HPI: Mario [name] is a 67 yo male with a history of newly diagnosed T2DM,  HLAB27 vasculitis, tobacco abuse/cigarette smoking, HTN, obesity, depression, COPD, OSA  who was admitted to [facility] as his pain from bilateral lower leg wounds worsened. He had run out of pain medications.   He reports he developed some bumps over his arms and legs back in Feb. 2019 and was admitted to the [place] in [place] about 6 weeks ago with work up per his report consistent with vasculitis and cellulitis of the extremities. He reports his T2DM diagnosis is new. He reports previously being treated with Prednisone, MTX and Enbrel for his HLAB27.  He reports he was seeing Derm at the [facility] for follow up and was doing silver dressings to the lower legs twice weekly. He reports the drainage from the ulcers has lessened since this time, but that the wounds are no better than a couple of months ago and they are getting more painful.   He was on Keflex for about a month following his discharge from the [place]. He reports he was scheduled to have some vascular studies done of the lower extremities, but that he had not had any recent studies done. He reports fair control of his DM at home.  We are asked to evaluate his wounds.  He has been evaluated by ID and is being treated for cellulitis on top of his vasculitis. Wound cultures have shown no organisms, no growth and no wbc's. WBC is 13.5. Afebrile.  The patient reports some improvement in pain since admit. Current wound care is silvadene cream and xeroform dressings as everything else including adaptic was sticking to the ulcers and making dressing changes very painful. He reports he has sensation changes and is very numb in parts of the anterior ankle areas bilaterally.    Past Medical  History:  Diagnosis Date  . Anxiety   . Bronchitis   . COPD (chronic obstructive pulmonary disease) ([place])   . Depression   . Hypertension   . Mental disorder   . MI (myocardial infarction) ([place])    ????  . OSA (obstructive sleep apnea)   . Suicide attempt ([place])   . Tension pneumothorax 06/27/2016    Past Surgical History:  Procedure Laterality Date  . CHEST TUBE INSERTION Left 06/27/2016  . cryptorchidism    . SKIN CANCER EXCISION      Family History  Problem Relation Age of Onset  . Dementia Father     Social History:  reports that he has been smoking cigarettes.  He has a 10.00 pack-year smoking history. He has never used smokeless tobacco. He reports that he does not drink alcohol or use drugs.  Allergies:  Allergies  Allergen Reactions  . Demerol [Meperidine] Nausea And Vomiting    Violently sick  . Zocor [Simvastatin] Other (See Comments)    Made him very jittery, nausea and vomiting    Medications:  Continuous: . vancomycin 2,000 mg (07/17/17 1216)    Results for orders placed or performed during the hospital encounter of 07/15/17 (from the past 48 hour(s))  CBC with Differential/Platelet     Status: Abnormal   Collection Time: 07/16/17 12:14 AM  Result Value Ref Range   WBC 13.5 (H) 4.0 - 10.5 K/uL   RBC 4.21 (L) 4.22 - 5.81 MIL/uL  Hemoglobin 14.4 13.0 - 17.0 g/dL   HCT 43.9 39.0 - 52.0 %   MCV 104.3 (H) 78.0 - 100.0 fL   MCH 34.2 (H) 26.0 - 34.0 pg   MCHC 32.8 30.0 - 36.0 g/dL   RDW 14.5 11.5 - 15.5 %   Platelets 307 150 - 400 K/uL   Neutrophils Relative % 82 %   Lymphocytes Relative 13 %   Monocytes Relative 5 %   Eosinophils Relative 0 %   Basophils Relative 0 %   Neutro Abs 11.0 (H) 1.7 - 7.7 K/uL   Lymphs Abs 1.8 0.7 - 4.0 K/uL   Monocytes Absolute 0.7 0.1 - 1.0 K/uL   Eosinophils Absolute 0.0 0.0 - 0.7 K/uL   Basophils Absolute 0.0 0.0 - 0.1 K/uL   Smear Review MORPHOLOGY UNREMARKABLE     Comment: Performed at [facility], [place] [address] [place], [place] [address]  Basic metabolic panel     Status: Abnormal   Collection Time: 07/16/17 12:14 AM  Result Value Ref Range   Sodium 141 135 - 145 mmol/L   Potassium 4.9 3.5 - 5.1 mmol/L   Chloride 105 101 - 111 mmol/L   CO2 23 22 - 32 mmol/L   Glucose, Bld 139 (H) 65 - 99 mg/dL   BUN 20 6 - 20 mg/dL   Creatinine, Ser 1.18 0.61 - 1.24 mg/dL   Calcium 9.4 8.9 - 10.3 mg/dL   GFR calc non Af Amer >60 >60 mL/min   GFR calc Af Amer >60 >60 mL/min    Comment: (NOTE) The eGFR has been calculated using the CKD EPI equation. This calculation has not been validated in all clinical situations. eGFR's persistently <60 mL/min signify possible Chronic Kidney Disease.    Anion gap 13 5 - 15    Comment: Performed at [facility], [place] [address], [place], [place] [address]  Wound or Superficial Culture     Status: None (Preliminary result)   Collection Time: 07/16/17 12:14 AM  Result Value Ref Range   Specimen Description      FOOT RIGHT Performed at [facility], [place] [address], [place], [place] [address]    Special Requests      NONE Performed at [facility], [place] [address] [place], [place] [address]    Gram Stain NO WBC SEEN NO ORGANISMS SEEN     Culture      CULTURE REINCUBATED FOR BETTER GROWTH Performed at [facility], [place] [address], [place], [place] [address]    Report Status PENDING   Glucose, capillary     Status: Abnormal   Collection Time: 07/16/17  7:20 AM  Result Value Ref Range   Glucose-Capillary 100 (H) 65 - 99 mg/dL  Glucose, capillary     Status: Abnormal   Collection Time: 07/16/17 11:35 AM  Result Value Ref Range   Glucose-Capillary 164 (H) 65 - 99 mg/dL  Culture, blood (Routine X 2) w Reflex to ID Panel     Status: None (Preliminary result)   Collection Time: 07/16/17  3:59 PM  Result Value Ref Range   Specimen Description      BLOOD RIGHT HAND Performed at [facility], [place] [address], [place], [place] [address]    Special Requests      BOTTLES DRAWN AEROBIC ONLY Blood Culture adequate volume Performed at [place] [address] [place], [place] [address]    Culture PENDING  Report Status PENDING   Glucose, capillary     Status: Abnormal   Collection Time: 07/16/17  4:46 PM  Result Value Ref Range   Glucose-Capillary 142 (H) 65 - 99 mg/dL  Glucose, capillary     Status: Abnormal   Collection Time: 07/16/17  9:37 PM  Result Value Ref Range   Glucose-Capillary 161 (H) 65 - 99 mg/dL  CBC     Status: Abnormal   Collection Time: 07/17/17  5:33 AM  Result Value Ref Range   WBC 13.7 (H) 4.0 - 10.5 K/uL   RBC 3.79 (L) 4.22 - 5.81 MIL/uL   Hemoglobin 12.9 (L) 13.0 - 17.0 g/dL   HCT 39.7 39.0 - 52.0 %   MCV 104.7 (H) 78.0 - 100.0 fL   MCH 34.0 26.0 - 34.0 pg   MCHC 32.5 30.0 - 36.0 g/dL   RDW 14.2 11.5 - 15.5 %   Platelets 256 150 - 400 K/uL    Comment: Performed at [facility], [place] [address] [place], [place] [address]  Basic metabolic panel     Status: Abnormal   Collection Time: 07/17/17  5:33 AM  Result Value Ref Range   Sodium 141 135 - 145 mmol/L   Potassium 4.2 3.5 - 5.1 mmol/L   Chloride 105 101 - 111 mmol/L   CO2 28 22 - 32 mmol/L   Glucose, Bld 140 (H) 65 - 99 mg/dL   BUN 16 6 - 20 mg/dL   Creatinine, Ser 0.98 0.61 - 1.24 mg/dL   Calcium 8.8 (L) 8.9 - 10.3 mg/dL   GFR calc non Af Amer >60 >60 mL/min   GFR calc Af Amer >60 >60 mL/min    Comment: (NOTE) The eGFR has been calculated using the CKD EPI equation. This calculation has not been validated in all clinical situations. eGFR's persistently <60 mL/min signify possible Chronic Kidney Disease.    Anion gap 8 5 - 15    Comment: Performed at [facility], [place] [address], [place], [place] [address]  Glucose, capillary     Status: None   Collection Time: 07/17/17  7:53 AM  Result Value Ref Range    Glucose-Capillary 95 65 - 99 mg/dL  Glucose, capillary     Status: Abnormal   Collection Time: 07/17/17 12:01 PM  Result Value Ref Range   Glucose-Capillary 145 (H) 65 - 99 mg/dL    No results found.  Review of Systems  Constitutional: Positive for malaise/fatigue. Negative for chills, fever and weight loss.  Respiratory: Negative.   Cardiovascular: Positive for claudication and leg swelling.  Musculoskeletal: Positive for joint pain and myalgias.  Skin: Positive for rash.  Psychiatric/Behavioral: Positive for depression.   Blood pressure 132/89, pulse 98, temperature (!) 97.5 F (36.4 C), temperature source Oral, resp. rate 17, height '6\' 2"'  (1.88 m), weight 66.2 kg (146 lb), SpO2 95 %. Physical Exam  Constitutional: He is oriented to person, place, and time.  Obese elderly male lying in bed complains with severe pain with the dressing changes.   HENT:  Head: Normocephalic and atraumatic.  Cardiovascular: Normal rate.  Respiratory: Effort normal. No respiratory distress.  Musculoskeletal: He exhibits edema.  Painful dorsiflexion of right ankle>> left ankle.   Neurological: He is alert and oriented to person, place, and time.  Skin: There is erythema.  Bilateral lower legs with multiple shallow appearing ulcerations with nearly 100 % yellow green slough in nearly all wound beds. Very little pale pink tissue in several  smaller wounds. Minimal tan green drainage, some odor.  Bilateral toes very cool to touch. Difficulty palpating pedal pulses. Purple discoloration of toes. Erythema of distal calves.   Psychiatric: He has a normal mood and affect. His behavior is normal. Thought content normal.    Assessment/Plan: Bilateral lower leg ulcerations ? Vasculitis and cellulitis- Currently on Vancomycin and local wound care with silvadene and xeroform and kerlix dressing changes daily.   Might benefit from debridement in the OR and placement of Acell.   Would need ABI's to help  determine vascular status if not done at [facility].   Also need pre albumin and would recommend nutrition consult to make sure patient is meeting protein needs and also for further diet education with newly diagnosed DM.   Patient currently not smoking and hopefully can quit permanently.   Will discuss case with Dr. Marla Roe and she will follow up with patient later this week.   RAYBURN,SHAWN,PA-C Plastic Surgery [phone]

## 2017-07-17 NOTE — Progress Notes (Signed)
PROGRESS NOTE    Mario [name]  OIB:[id number] DOB: [birth year] DOA: 07/15/2017 PCP: [facility]   Brief Narrative:  HPI on 07/16/2017 by Dr. Jennette Kettle Mario [name] is a 67 y.o. male with medical history significant of HLA-B27, uveitis.  Patient is normally on prednisone and MTX.  Patient developed painful red bumps on forearms and feet in Jan-Feb.  Sounds like he was treated with prednisone, MTX, and possibly mycophenolate for a time (not currently).  Bumps on forearm went away but developed weeping sores on feet.   His diagnostic work up was at the [place] and has included Derm consult, Rheum consult, and skin biopsy x3. 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 These have persisted despite a recent course of keflex. He was told to hold MTX while he was on Keflex. So this has been on hold. Feet are severely painful.  He is out of pain meds.  Interim history Admitted for cellulitis and currently on antibiotics. Pending records from [place]. ID and plastics consulted.   Assessment & Plan   Lower extremity cellulitis -Possibly bacterial superimposed on a questionable vasculitis -Patient was initially placed on Keflex as an outpatient.  Has been dealing with this since January 2019.  Has been going to the [place] for his care.  Patient has seen dermatology and rheumatology has also had skin biopsy.  States he was supposed to be transferred to [facility] from the [place] however this did not occur. -Patient initially placed on clindamycin however given his age and history of C. difficile colitis, will place him on vancomycin -Pending records from the [place]. -Wound culture pending; shows no organisms on Gram stain -Continue pain control.  Patient currently asking for IV Dilaudid and states oxycodone is currently not helping him. -Wound care recommended ID consultation as well as plastic surgery consultation -ID consulted and appreciated, recommended continue  vancomycin and ceftriaxone when patient improves. -Plastic surgery consulted and appreciated, will likely evaluate on 07/18/2017  HLA-B 27 -Continue prednisone  Essential hypertension -Continue lisinopril  Diabetes mellitus, type II with neuropathy -Continue Lantus, insulin sliding scale and CBG monitoring -Continue gabapentin for neuropathy  Depression/bipolar disorder -Continue Cymbalta, Remeron, Latuda, and Wellbutrin  DVT Prophylaxis Lovenox  Code Status: Full  Family Communication: None at bedside  Disposition Plan: Admitted. Dispo pending  Consultants Infectious disease Plastic surgery  Procedures  None  Antibiotics   Anti-infectives (From admission, onward)   Start     Dose/Rate Route Frequency Ordered Stop   07/17/17 1200  vancomycin (VANCOCIN) 2,000 mg in sodium chloride 0.9 % 500 mL IVPB     2,000 mg 250 mL/hr over 120 Minutes Intravenous Every 24 hours 07/16/17 1021     07/16/17 1100  vancomycin (VANCOCIN) 2,500 mg in sodium chloride 0.9 % 500 mL IVPB     2,500 mg 250 mL/hr over 120 Minutes Intravenous  Once 07/16/17 1021 07/16/17 1300   07/16/17 0930  vancomycin (VANCOCIN) IVPB 1000 mg/200 mL premix  Status:  Discontinued     1,000 mg 200 mL/hr over 60 Minutes Intravenous  Once 07/16/17 0922 07/16/17 1018   07/16/17 0600  clindamycin (CLEOCIN) IVPB 600 mg  Status:  Discontinued     600 mg 100 mL/hr over 30 Minutes Intravenous Every 8 hours 07/16/17 0238 07/16/17 0249   07/16/17 0600  clindamycin (CLEOCIN) IVPB 600 mg  Status:  Discontinued     600 mg 100 mL/hr over 30 Minutes Intravenous Every 8 hours 07/16/17 0256 07/16/17  4854   07/16/17 0300  doxycycline (VIBRAMYCIN) 100 mg in sodium chloride 0.9 % 250 mL IVPB  Status:  Discontinued     100 mg 125 mL/hr over 120 Minutes Intravenous Every 12 hours 07/16/17 0251 07/16/17 0251   07/16/17 0300  clindamycin (CLEOCIN) IVPB 600 mg  Status:  Discontinued     600 mg 100 mL/hr over 30 Minutes Intravenous  Every 8 hours 07/16/17 0257 07/16/17 6270      Subjective:   Mario [name] seen and examined today.  Continue to have pain in the lower extremities, however feels her mildly improved.  Denies current chest pain, shortness of breath, abdominal pain, nausea or vomiting, diarrhea constipation, dizziness or headache.  Objective:   Vitals:   07/16/17 2141 07/17/17 0450 07/17/17 0800 07/17/17 0932  BP: 136/82 118/87    Pulse: 100 92    Resp: 16 16    Temp: 97.9 F (36.6 C) (!) 97.5 F (36.4 C)    TempSrc: Oral Oral    SpO2: 97% 93%  93%  Weight:   66.2 kg (146 lb)   Height:        Intake/Output Summary (Last 24 hours) at 07/17/2017 1220 Last data filed at 07/17/2017 0815 Gross per 24 hour  Intake 600 ml  Output -  Net 600 ml   Filed Weights   07/16/17 1011 07/17/17 0800  Weight: (!) 149.4 kg (329 lb 6.4 oz) 66.2 kg (146 lb)   Exam  General: Well developed, well nourished, NAD, appears stated age  HEENT: NCAT, mucous membranes moist.   Neck: Supple  Cardiovascular: S1 S2 auscultated, no rubs, murmurs or gallops. Regular rate and rhythm.  Respiratory: Clear to auscultation bilaterally with equal chest rise  Abdomen: Soft, obese, nontender, nondistended, + bowel sounds  Extremities: warm dry without cyanosis clubbing. +2 LE edema B/L with dressing in place  Neuro: AAOx3, nonfocal  Psych: appropriate mood and affect  Data Reviewed: I have personally reviewed following labs and imaging studies  CBC: Recent Labs  Lab 07/16/17 0014 07/17/17 0533  WBC 13.5* 13.7*  NEUTROABS 11.0*  --   HGB 14.4 12.9*  HCT 43.9 39.7  MCV 104.3* 104.7*  PLT 307 350   Basic Metabolic Panel: Recent Labs  Lab 07/16/17 0014 07/17/17 0533  NA 141 141  K 4.9 4.2  CL 105 105  CO2 23 28  GLUCOSE 139* 140*  BUN 20 16  CREATININE 1.18 0.98  CALCIUM 9.4 8.8*   GFR: Estimated Creatinine Clearance: 70.4 mL/min (by C-G formula based on SCr of 0.98 mg/dL). Liver Function  Tests: No results for input(s): AST, ALT, ALKPHOS, BILITOT, PROT, ALBUMIN in the last 168 hours. No results for input(s): LIPASE, AMYLASE in the last 168 hours. No results for input(s): AMMONIA in the last 168 hours. Coagulation Profile: No results for input(s): INR, PROTIME in the last 168 hours. Cardiac Enzymes: No results for input(s): CKTOTAL, CKMB, CKMBINDEX, TROPONINI in the last 168 hours. BNP (last 3 results) No results for input(s): PROBNP in the last 8760 hours. HbA1C: No results for input(s): HGBA1C in the last 72 hours. CBG: Recent Labs  Lab 07/16/17 1135 07/16/17 1646 07/16/17 2137 07/17/17 0753 07/17/17 1201  GLUCAP 164* 142* 161* 95 145*   Lipid Profile: No results for input(s): CHOL, HDL, LDLCALC, TRIG, CHOLHDL, LDLDIRECT in the last 72 hours. Thyroid Function Tests: No results for input(s): TSH, T4TOTAL, FREET4, T3FREE, THYROIDAB in the last 72 hours. Anemia Panel: No results for input(s): VITAMINB12, FOLATE, FERRITIN,  TIBC, IRON, RETICCTPCT in the last 72 hours. Urine analysis:    Component Value Date/Time   COLORURINE YELLOW 01/12/2013 0024   APPEARANCEUR CLEAR 01/12/2013 0024   LABSPEC 1.026 01/12/2013 0024   PHURINE 7.0 01/12/2013 0024   GLUCOSEU NEGATIVE 01/12/2013 0024   HGBUR NEGATIVE 01/12/2013 0024   BILIRUBINUR NEGATIVE 01/12/2013 0024   KETONESUR NEGATIVE 01/12/2013 0024   PROTEINUR NEGATIVE 01/12/2013 0024   UROBILINOGEN 1.0 01/12/2013 0024   NITRITE NEGATIVE 01/12/2013 0024   LEUKOCYTESUR NEGATIVE 01/12/2013 0024   Sepsis Labs: '@LABRCNTIP' (procalcitonin:4,lacticidven:4)  ) Recent Results (from the past 240 hour(s))  Wound or Superficial Culture     Status: None (Preliminary result)   Collection Time: 07/16/17 12:14 AM  Result Value Ref Range Status   Specimen Description   Final    FOOT RIGHT Performed at [facility], [place] [address], [place], [place] [address]    Special Requests   Final    NONE Performed at [facility], [place] [address] [place], [place] [address]    Gram Stain NO WBC SEEN NO ORGANISMS SEEN   Final   Culture   Final    CULTURE REINCUBATED FOR BETTER GROWTH Performed at [facility], [place] [address], [place], [place] [address]    Report Status PENDING  Incomplete  Culture, blood (Routine X 2) w Reflex to ID Panel     Status: None (Preliminary result)   Collection Time: 07/16/17  3:59 PM  Result Value Ref Range Status   Specimen Description   Final    BLOOD RIGHT HAND Performed at [facility], [place] [address] [place], [place] [address]    Special Requests   Final    BOTTLES DRAWN AEROBIC ONLY Blood Culture adequate volume Performed at [place] [address] [place], [place] [address]    Culture PENDING  Incomplete   Report Status PENDING  Incomplete      Radiology Studies: No results found.   Scheduled Meds: . aspirin EC  81 mg Oral Daily  . buPROPion  300 mg Oral QPC breakfast  . cholecalciferol  3,000 Units Oral Daily  . DULoxetine  30 mg Oral QHS  . DULoxetine  60 mg Oral Daily  . enoxaparin (LOVENOX) injection  40 mg Subcutaneous Q24H  . famotidine  20 mg Oral BID  . folic acid  1 mg Oral Daily  . gabapentin  900 mg Oral TID  . insulin aspart  0-9 Units Subcutaneous TID WC  . insulin glargine  12 Units Subcutaneous QHS  . lisinopril  20 mg Oral Daily  . lurasidone  20 mg Oral QHS  . mirtazapine  15 mg Oral QHS  . predniSONE  35 mg Oral Q breakfast  . senna-docusate  1 tablet Oral Daily  . silver sulfADIAZINE   Topical Daily  . tiotropium  18 mcg Inhalation Daily   Continuous Infusions: . vancomycin 2,000 mg (07/17/17 1216)     LOS: 1 day   Time Spent in minutes   30 minutes  Dereon Williamsen D.O. on 07/17/2017 at 12:20 PM  Between 7am to 7pm - Pager - [phone]  After 7pm go to [email or web address] - password [email or web address]  And look for the night coverage person covering for me after hours  [facility] Office  [phone]

## 2017-07-18 ENCOUNTER — Encounter (HOSPITAL_COMMUNITY): Payer: Self-pay

## 2017-07-18 LAB — AEROBIC CULTURE  (SUPERFICIAL SPECIMEN): Culture: NORMAL

## 2017-07-18 LAB — BASIC METABOLIC PANEL
Anion gap: 10 (ref 5–15)
BUN: 17 mg/dL (ref 6–20)
CO2: 29 mmol/L (ref 22–32)
Calcium: 8.8 mg/dL — ABNORMAL LOW (ref 8.9–10.3)
Chloride: 102 mmol/L (ref 101–111)
Creatinine, Ser: 0.89 mg/dL (ref 0.61–1.24)
GFR calc Af Amer: >60 mL/min (ref >60)
GFR calc non Af Amer: >60 mL/min (ref >60)
Glucose, Bld: 163 mg/dL — ABNORMAL HIGH (ref 65–99)
Potassium: 3.6 mmol/L (ref 3.5–5.1)
Sodium: 141 mmol/L (ref 135–145)

## 2017-07-18 LAB — AEROBIC CULTURE W GRAM STAIN (SUPERFICIAL SPECIMEN): Gram Stain: NONE SEEN

## 2017-07-18 LAB — GLUCOSE, CAPILLARY
Glucose-Capillary: 102 mg/dL — ABNORMAL HIGH (ref 65–99)
Glucose-Capillary: 134 mg/dL — ABNORMAL HIGH (ref 65–99)
Glucose-Capillary: 164 mg/dL — ABNORMAL HIGH (ref 65–99)
Glucose-Capillary: 115 mg/dL — ABNORMAL HIGH (ref 65–99)
Glucose-Capillary: 147 mg/dL — ABNORMAL HIGH (ref 65–99)

## 2017-07-18 LAB — PREALBUMIN: Prealbumin: 39 mg/dL — ABNORMAL HIGH (ref 18–38)

## 2017-07-18 MED ORDER — VANCOMYCIN HCL 10 G IV SOLR
1500.0000 mg | Freq: Two times a day (BID) | INTRAVENOUS | Status: DC
  Administered 2017-07-18 – 2017-07-21 (×7): 1500 mg via INTRAVENOUS
  Filled 2017-07-18 (×8): qty 1500

## 2017-07-18 MED ORDER — HYDROMORPHONE HCL 1 MG/ML IJ SOLN
1.0000 mg | Freq: Once | INTRAMUSCULAR | Status: AC
  Administered 2017-07-18: 1 mg via INTRAVENOUS
  Filled 2017-07-18: qty 1

## 2017-07-18 NOTE — Progress Notes (Signed)
Pharmacy Antibiotic Note  Mario [name] is a 67 y.o. male admitted on 07/15/2017 with cellulitis.  Pharmacy has been consulted for vanc dosing.  Plan: Day 3 antibiotics 1) SCr improved some - will change vanc from 2g q24 to 1500mg  IV q12 - goal AUC still 400-500 2) Will check vanc pk/tr as appropriate  Height: 6\' 2"  (188 cm) Weight: (!) 321 lb 14 oz (146 kg) IBW/kg (Calculated) : 82.2  Temp (24hrs), Avg:97.8 F (36.6 C), Min:97.7 F (36.5 C), Max:97.9 F (36.6 C)  Recent Labs  Lab 07/16/17 0014 07/17/17 0533 07/18/17 0544  WBC 13.5* 13.7*  --   CREATININE 1.18 0.98 0.89    Estimated Creatinine Clearance: 126.1 mL/min (by C-G formula based on SCr of 0.89 mg/dL).    Allergies  Allergen Reactions  . Demerol [Meperidine] Nausea And Vomiting    Violently sick  . Zocor [Simvastatin] Other (See Comments)    Made him very jittery, nausea and vomiting     Thank you for allowing pharmacy to be a part of this patient's care.  Adrian Saran, PharmD, BCPS Pager [phone] 07/18/2017 8:53 AM

## 2017-07-18 NOTE — Progress Notes (Signed)
PROGRESS NOTE    Filippo Puls  CHY:[id number] DOB: Mar 19, 1950 DOA: 07/15/2017 PCP: [facility]   Brief Narrative:  HPI on 07/16/2017 by Dr. Jennette Kettle Keylen Eckenrode is a 67 y.o. male with medical history significant of HLA-B27, uveitis.  Patient is normally on prednisone and MTX.  Patient developed painful red bumps on forearms and feet in Jan-Feb.  Sounds like he was treated with prednisone, MTX, and possibly mycophenolate for a time (not currently).  Bumps on forearm went away but developed weeping sores on feet.   His diagnostic work up was at the [place] and has included Derm consult, Rheum consult, and skin biopsy x3. 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 These have persisted despite a recent course of keflex. He was told to hold MTX while he was on Keflex. So this has been on hold. Feet are severely painful.  He is out of pain meds.  Interim history Admitted for cellulitis and currently on antibiotics. Pending records from [place]. ID and plastics consulted.   Assessment & Plan   Lower extremity cellulitis -Possibly bacterial superimposed on a questionable vasculitis -Patient was initially placed on Keflex as an outpatient.  Has been dealing with this since January 2019.  Has been going to the [place] for his care.  Patient has seen dermatology and rheumatology has also had skin biopsy.  States he was supposed to be transferred to [facility] from the [place] however this did not occur. -Patient initially placed on clindamycin however given his age and history of C. difficile colitis, will place him on vancomycin -Pending records from the [place]. -Wound culture pending; shows no organisms on Gram stain -Continue pain control.  Patient currently asking for IV Dilaudid and states oxycodone is currently not helping him. -Wound care recommended ID consultation as well as plastic surgery consultation -ID consulted and appreciated, recommended continue  vancomycin and ceftriaxone when patient improves. -Plastic surgery consulted and appreciated, and recommended ABI and prealbumin for possible nutrition consult. May benefit from debridement  HLA-B 27 -Continue prednisone  Essential hypertension -Continue lisinopril  Diabetes mellitus, type II with neuropathy -Continue Lantus, insulin sliding scale and CBG monitoring -Continue gabapentin for neuropathy  Depression/bipolar disorder -Continue Cymbalta, Remeron, Latuda, and Wellbutrin  DVT Prophylaxis Lovenox  Code Status: Full  Family Communication: None at bedside  Disposition Plan: Admitted. Pending further recommendations from plastics  Consultants Infectious disease Plastic surgery  Procedures  None  Antibiotics   Anti-infectives (From admission, onward)   Start     Dose/Rate Route Frequency Ordered Stop   07/18/17 1000  vancomycin (VANCOCIN) 1,500 mg in sodium chloride 0.9 % 500 mL IVPB     1,500 mg 250 mL/hr over 120 Minutes Intravenous Every 12 hours 07/18/17 0855     07/17/17 1200  vancomycin (VANCOCIN) 2,000 mg in sodium chloride 0.9 % 500 mL IVPB  Status:  Discontinued     2,000 mg 250 mL/hr over 120 Minutes Intravenous Every 24 hours 07/16/17 1021 07/18/17 0855   07/16/17 1100  vancomycin (VANCOCIN) 2,500 mg in sodium chloride 0.9 % 500 mL IVPB     2,500 mg 250 mL/hr over 120 Minutes Intravenous  Once 07/16/17 1021 07/16/17 1300   07/16/17 0930  vancomycin (VANCOCIN) IVPB 1000 mg/200 mL premix  Status:  Discontinued     1,000 mg 200 mL/hr over 60 Minutes Intravenous  Once 07/16/17 0922 07/16/17 1018   07/16/17 0600  clindamycin (CLEOCIN) IVPB 600 mg  Status:  Discontinued  600 mg 100 mL/hr over 30 Minutes Intravenous Every 8 hours 07/16/17 0238 07/16/17 0249   07/16/17 0600  clindamycin (CLEOCIN) IVPB 600 mg  Status:  Discontinued     600 mg 100 mL/hr over 30 Minutes Intravenous Every 8 hours 07/16/17 0256 07/16/17 0257   07/16/17 0300  doxycycline  (VIBRAMYCIN) 100 mg in sodium chloride 0.9 % 250 mL IVPB  Status:  Discontinued     100 mg 125 mL/hr over 120 Minutes Intravenous Every 12 hours 07/16/17 0251 07/16/17 0251   07/16/17 0300  clindamycin (CLEOCIN) IVPB 600 mg  Status:  Discontinued     600 mg 100 mL/hr over 30 Minutes Intravenous Every 8 hours 07/16/17 0257 07/16/17 4270      Subjective:   Corliss Marcus seen and examined today.  Feeling pain is better today. Denies current pain, shortness of breath, abdominal pain, nausea vomiting, diarrhea or constipation.  Objective:   Vitals:   07/17/17 1332 07/17/17 2223 07/18/17 0553 07/18/17 0829  BP: 132/89 125/80 (!) 151/98   Pulse: 98 94 98   Resp: '17 17 16   ' Temp:  97.9 F (36.6 C) 97.7 F (36.5 C)   TempSrc:  Oral Oral   SpO2: 95% 94% 94% 93%  Weight:      Height:        Intake/Output Summary (Last 24 hours) at 07/18/2017 1304 Last data filed at 07/18/2017 6237 Gross per 24 hour  Intake 1660 ml  Output -  Net 1660 ml   Filed Weights   07/16/17 1011 07/17/17 0800  Weight: (!) 149.4 kg (329 lb 6.4 oz) (!) 146 kg (321 lb 14 oz)   Exam  General: Well developed, well nourished, NAD, appears stated age  HEENT: NCAT,  mucous membranes moist.   Neck: Supple  Cardiovascular: S1 S2 auscultated, no rubs, murmurs or gallops. Regular rate and rhythm.  Respiratory: Clear to auscultation bilaterally with equal chest rise  Abdomen: Soft, obese, nontender, nondistended, + bowel sounds  Extremities: warm dry without cyanosis clubbing. +2 LE edema B/L with dressing in place  Neuro: AAOx3, nonfocal  Psych: propria mood and affect  Data Reviewed: I have personally reviewed following labs and imaging studies  CBC: Recent Labs  Lab 07/16/17 0014 07/17/17 0533  WBC 13.5* 13.7*  NEUTROABS 11.0*  --   HGB 14.4 12.9*  HCT 43.9 39.7  MCV 104.3* 104.7*  PLT 307 628   Basic Metabolic Panel: Recent Labs  Lab 07/16/17 0014 07/17/17 0533 07/18/17 0544  NA 141  141 141  K 4.9 4.2 3.6  CL 105 105 102  CO2 '23 28 29  ' GLUCOSE 139* 140* 163*  BUN '20 16 17  ' CREATININE 1.18 0.98 0.89  CALCIUM 9.4 8.8* 8.8*   GFR: Estimated Creatinine Clearance: 126.1 mL/min (by C-G formula based on SCr of 0.89 mg/dL). Liver Function Tests: No results for input(s): AST, ALT, ALKPHOS, BILITOT, PROT, ALBUMIN in the last 168 hours. No results for input(s): LIPASE, AMYLASE in the last 168 hours. No results for input(s): AMMONIA in the last 168 hours. Coagulation Profile: No results for input(s): INR, PROTIME in the last 168 hours. Cardiac Enzymes: No results for input(s): CKTOTAL, CKMB, CKMBINDEX, TROPONINI in the last 168 hours. BNP (last 3 results) No results for input(s): PROBNP in the last 8760 hours. HbA1C: No results for input(s): HGBA1C in the last 72 hours. CBG: Recent Labs  Lab 07/17/17 1201 07/17/17 1638 07/17/17 2218 07/18/17 0717 07/18/17 1211  GLUCAP 145* 138* 147* 102* 134*  Lipid Profile: No results for input(s): CHOL, HDL, LDLCALC, TRIG, CHOLHDL, LDLDIRECT in the last 72 hours. Thyroid Function Tests: No results for input(s): TSH, T4TOTAL, FREET4, T3FREE, THYROIDAB in the last 72 hours. Anemia Panel: No results for input(s): VITAMINB12, FOLATE, FERRITIN, TIBC, IRON, RETICCTPCT in the last 72 hours. Urine analysis:    Component Value Date/Time   COLORURINE YELLOW 01/12/2013 0024   APPEARANCEUR CLEAR 01/12/2013 0024   LABSPEC 1.026 01/12/2013 0024   PHURINE 7.0 01/12/2013 0024   GLUCOSEU NEGATIVE 01/12/2013 0024   HGBUR NEGATIVE 01/12/2013 0024   BILIRUBINUR NEGATIVE 01/12/2013 0024   KETONESUR NEGATIVE 01/12/2013 0024   PROTEINUR NEGATIVE 01/12/2013 0024   UROBILINOGEN 1.0 01/12/2013 0024   NITRITE NEGATIVE 01/12/2013 0024   LEUKOCYTESUR NEGATIVE 01/12/2013 0024   Sepsis Labs: '@LABRCNTIP' (procalcitonin:4,lacticidven:4)  ) Recent Results (from the past 240 hour(s))  Wound or Superficial Culture     Status: None   Collection  Time: 07/16/17 12:14 AM  Result Value Ref Range Status   Specimen Description   Final    FOOT RIGHT Performed at [facility], [place] [address] [place], [place] [address]    Special Requests   Final    NONE Performed at [facility], [place] [address] [place], [place] [address]    Gram Stain NO WBC SEEN NO ORGANISMS SEEN   Final   Culture   Final    NORMAL SKIN FLORA Performed at [facility], [place] [address] [place], [place] [address]    Report Status 07/18/2017 FINAL  Final  Culture, blood (Routine X 2) w Reflex to ID Panel     Status: None (Preliminary result)   Collection Time: 07/16/17  3:59 PM  Result Value Ref Range Status   Specimen Description   Final    BLOOD RIGHT HAND Performed at [facility], [place] [address] [place], [place] [address]    Special Requests   Final    BOTTLES DRAWN AEROBIC ONLY Blood Culture adequate volume Performed at [place] [address] [place], [place] [address]    Culture   Final    NO GROWTH < 24 HOURS Performed at [place] [address] [place], [place] [address]    Report Status PENDING  Incomplete      Radiology Studies: No results found.   Scheduled Meds: . aspirin EC  81 mg Oral Daily  . buPROPion  300 mg Oral QPC breakfast  . cholecalciferol  3,000 Units Oral Daily  . DULoxetine  30 mg Oral QHS  . DULoxetine  60 mg Oral Daily  . enoxaparin (LOVENOX) injection  40 mg Subcutaneous Q24H  . famotidine  20 mg Oral BID  . folic acid  1 mg Oral Daily  . gabapentin  900 mg Oral TID  .  HYDROmorphone (DILAUDID) injection  1 mg Intravenous Once  . insulin aspart  0-9 Units Subcutaneous TID WC  . insulin glargine  12 Units Subcutaneous QHS  . lisinopril  20 mg Oral Daily  . lurasidone  20 mg Oral QHS  . mirtazapine  15 mg Oral QHS  . predniSONE  35 mg Oral Q breakfast  . senna-docusate  1 tablet Oral Daily  . silver sulfADIAZINE   Topical Daily  .  tiotropium  18 mcg Inhalation Daily   Continuous Infusions: . vancomycin Stopped (07/18/17 1108)     LOS: 2 days   Time Spent in minutes   30 minutes  Gennell How D.O. on 07/18/2017 at 1:04 PM  Between 7am to 7pm - Pager - [phone]  After 7pm go to [email or web address] - password [email or web address]  And look for the night coverage person covering for me after hours  [facility] Office  [phone]

## 2017-07-19 ENCOUNTER — Inpatient Hospital Stay (HOSPITAL_COMMUNITY): Payer: Medicare Other

## 2017-07-19 DIAGNOSIS — L0390 Cellulitis, unspecified: Secondary | ICD-10-CM

## 2017-07-19 LAB — BASIC METABOLIC PANEL
Anion gap: 10 (ref 5–15)
BUN: 14 mg/dL (ref 6–20)
CO2: 29 mmol/L (ref 22–32)
Calcium: 8.7 mg/dL — ABNORMAL LOW (ref 8.9–10.3)
Chloride: 104 mmol/L (ref 101–111)
Creatinine, Ser: 1.04 mg/dL (ref 0.61–1.24)
GFR calc Af Amer: >60 mL/min (ref >60)
GFR calc non Af Amer: >60 mL/min (ref >60)
Glucose, Bld: 113 mg/dL — ABNORMAL HIGH (ref 65–99)
Potassium: 3.9 mmol/L (ref 3.5–5.1)
Sodium: 143 mmol/L (ref 135–145)

## 2017-07-19 LAB — GLUCOSE, CAPILLARY
Glucose-Capillary: 104 mg/dL — ABNORMAL HIGH (ref 65–99)
Glucose-Capillary: 171 mg/dL — ABNORMAL HIGH (ref 65–99)
Glucose-Capillary: 170 mg/dL — ABNORMAL HIGH (ref 65–99)
Glucose-Capillary: 112 mg/dL — ABNORMAL HIGH (ref 65–99)

## 2017-07-19 LAB — CBC
HCT: 39.6 % (ref 39.0–52.0)
Hemoglobin: 12.8 g/dL — ABNORMAL LOW (ref 13.0–17.0)
MCH: 33.9 pg (ref 26.0–34.0)
MCHC: 32.3 g/dL (ref 30.0–36.0)
MCV: 104.8 fL — ABNORMAL HIGH (ref 78.0–100.0)
Platelets: 279 10*3/uL (ref 150–400)
RBC: 3.78 MIL/uL — ABNORMAL LOW (ref 4.22–5.81)
RDW: 14.1 % (ref 11.5–15.5)
WBC: 13.7 10*3/uL — ABNORMAL HIGH (ref 4.0–10.5)

## 2017-07-19 MED ORDER — HYDROMORPHONE HCL 1 MG/ML IJ SOLN
1.0000 mg | Freq: Every day | INTRAMUSCULAR | Status: DC
  Administered 2017-07-20 – 2017-07-21 (×2): 1 mg via INTRAVENOUS
  Filled 2017-07-19 (×2): qty 1

## 2017-07-19 NOTE — Care Management Important Message (Signed)
Important Message  Patient Details  Name: Beldon Nowling MRN: [id number] Date of Birth: 1951/01/07   Medicare Important Message Given:  Yes    Kerin Salen 07/19/2017, 1:34 PMImportant Message  Patient Details  Name: Jovan Colligan MRN: [id number] Date of Birth: 10-20-1950   Medicare Important Message Given:  Yes    Kerin Salen 07/19/2017, 1:34 PM

## 2017-07-19 NOTE — Progress Notes (Signed)
[facility] Progress Note  Patient: Mario [name] VHQ:[id number]   PCP: [facility] DOB: 1950/05/28   DOA: 07/15/2017   DOS: 07/19/2017   Date of Service: the patient was seen and examined on 07/19/2017  Subjective: Complains about severe sharp shooting pain in bilateral toes.  No nausea no vomiting no diarrhea.  No fever no chills no chest pain no abdominal pain.  Brief hospital course: Pt. with PMH of newly diagnosed type II DM, vasculitis, HLA-B27 positivity, active smoker, HTN, morbid obesity, depression, COPD, OSA; admitted on 07/15/2017, presented with complaint of leg pain, was found to have bilateral inflammatory ulcers.  Started on broad-spectrum antibiotics, infectious disease, plastic surgery both were consulted.  Information from [place] was requested, following documents are available in physical chart. dermatology follow-up on Jul 13, 2017. Diagnosis for leukocytoclastic vasculitis with MRSA infection Twice daily dressing changes with dakins ABI was planned to be performed on June 6. Wound care plan was soaking rolled gauze with dakins 0.125 solution then applied to both legs, apply AmLactin 12% solution to legs intact skin, apply Adaptic base to all wounds then try tach, cover with absorbent pad, wrap with 2 rolls of Kerlix, applied band neck to secure. Change twice weekly until healed. Based on the note dated 07/07/2017 the patient was using Tritec silver daily on his wound as opposed to weekly.  Rheumatology follow-up on 07/03/2017 Vasculitis rash initially was on Enbrel and methotrexate and MMF and patient was resumed back on these immunosuppressive medications. Prednisone was recommended to be tapered on 5 mg/week, on 35 mg on 07/05/2017. Mycophenolate 1500 mg twice daily, Enbrel weekly, methotrexate 6 tab 2.5 mg/week, folic acid 1 mg daily, ranitidine 150 mg twice daily  Currently further plan is continue antibiotics and follow-up on recommendation from plastic  surgery.  Assessment and Plan: 1.  Bilateral lower extremity ulcer. Cellulitis versus active vasculitis causing ulceration. Recently hospitalized for similar situation, per patient he was treated with Keflex although notes from dermatology mentions about MRSA infection. 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 Started on steroids, methotrexate, CellCept, Enbrel. Dressing changes were recommended although the patient was mistakenly performing dressing changes daily instead of weekly. There is no documentation available, on follow-up the ulcers were getting better. Patient ran out of his pain medication and came to ER with worsening pain in his legs. Started on broad-spectrum antibiotics, infectious disease consulted switched to IV vancomycin, potentially can transition to oral Bactrim/doxycycline/clindamycin. Suspicion for is an active infection is less and appears to be more likely progressive inflammatory vasculitis causing ulceration. ABI cannot be performed due to presence of ulcers although flow appears triphasic in all the vessels. Plastic surgery was consulted, feels the patient may require debridement. ID was also consulted and has signed off although recommending the patient to be transferred to the [place] as all his work-up has been completed there. At present continue with current plan and follow-up on recommendation from plastic surgery. We will discuss with vascular surgery tomorrow as well. Informed nursing to perform dressing changes in my presence tomorrow.  2.  Type 2 diabetes mellitus. Neuropathy. Continue Lantus, continue sliding scale insulin, blood glucose well controlled. Continue gabapentin for neuropathy. Hemoglobin A1c checked in July 2018, will recheck tomorrow.  3.  Depression, bipolar disorder. Continue Cymbalta, Remeron, Latuda, Wellbutrin.   4. Morbid obesity, OSA. Continue CPAP at night.  Diet: Cardiac diet carb  modified DVT Prophylaxis: subcutaneous Heparin  Advance goals of care discussion: full code  Family Communication:  no family was present at bedside, at the time of interview.   Disposition:  Planning depends on plastic surgery consultation, also depends on patient's clinical progression.  Consultants: Infectious disease, plastic surgery Procedures: ABI  Antibiotics: Anti-infectives (From admission, onward)   Start     Dose/Rate Route Frequency Ordered Stop   07/18/17 1000  vancomycin (VANCOCIN) 1,500 mg in sodium chloride 0.9 % 500 mL IVPB     1,500 mg 250 mL/hr over 120 Minutes Intravenous Every 12 hours 07/18/17 0855     07/17/17 1200  vancomycin (VANCOCIN) 2,000 mg in sodium chloride 0.9 % 500 mL IVPB  Status:  Discontinued     2,000 mg 250 mL/hr over 120 Minutes Intravenous Every 24 hours 07/16/17 1021 07/18/17 0855   07/16/17 1100  vancomycin (VANCOCIN) 2,500 mg in sodium chloride 0.9 % 500 mL IVPB     2,500 mg 250 mL/hr over 120 Minutes Intravenous  Once 07/16/17 1021 07/16/17 1300   07/16/17 0930  vancomycin (VANCOCIN) IVPB 1000 mg/200 mL premix  Status:  Discontinued     1,000 mg 200 mL/hr over 60 Minutes Intravenous  Once 07/16/17 0922 07/16/17 1018   07/16/17 0600  clindamycin (CLEOCIN) IVPB 600 mg  Status:  Discontinued     600 mg 100 mL/hr over 30 Minutes Intravenous Every 8 hours 07/16/17 0238 07/16/17 0249   07/16/17 0600  clindamycin (CLEOCIN) IVPB 600 mg  Status:  Discontinued     600 mg 100 mL/hr over 30 Minutes Intravenous Every 8 hours 07/16/17 0256 07/16/17 0257   07/16/17 0300  doxycycline (VIBRAMYCIN) 100 mg in sodium chloride 0.9 % 250 mL IVPB  Status:  Discontinued     100 mg 125 mL/hr over 120 Minutes Intravenous Every 12 hours 07/16/17 0251 07/16/17 0251   07/16/17 0300  clindamycin (CLEOCIN) IVPB 600 mg  Status:  Discontinued     600 mg 100 mL/hr over 30 Minutes Intravenous Every 8 hours 07/16/17 0257 07/16/17 0808       Objective: Physical  Exam: Vitals:   07/19/17 0531 07/19/17 1046 07/19/17 1048 07/19/17 1306  BP: (!) 138/91   (!) 135/93  Pulse: 81   100  Resp: 20   18  Temp: (!) 97.3 F (36.3 C)   98.1 F (36.7 C)  TempSrc: Oral   Oral  SpO2: 100% 93% 93% 91%  Weight:      Height:        Intake/Output Summary (Last 24 hours) at 07/19/2017 1311 Last data filed at 07/19/2017 1305 Gross per 24 hour  Intake 2180 ml  Output -  Net 2180 ml   Filed Weights   07/16/17 1011 07/17/17 0800  Weight: (!) 149.4 kg (329 lb 6.4 oz) (!) 146 kg (321 lb 14 oz)   General: Alert, Awake and Oriented to Time, Place and Person. Appear in moderate distress, affect appropriate Eyes: PERRL, Conjunctiva normal ENT: Oral Mucosa clear moist. Neck: no JVD, no Abnormal Mass Or lumps Cardiovascular: S1 and S2 Present, no Murmur,  Respiratory: normal respiratory effort, Bilateral Air entry equal and Decreased, no use of accessory muscle, Clear to Auscultation, no Crackles, no wheezes Abdomen: Bowel Sound present, Soft and no tenderness, no hernia Skin: No rash seen on the exposed area.  Unable to complete examination due to presence of dressing. Extremities: bilateral Pedal edema, no calf tenderness Neurologic: Grossly no focal neuro deficit. Bilaterally Equal motor strength  Data Reviewed: CBC: Recent Labs  Lab 07/16/17 0014 07/17/17 0533 07/19/17 0540  WBC 13.5*  13.7* 13.7*  NEUTROABS 11.0*  --   --   HGB 14.4 12.9* 12.8*  HCT 43.9 39.7 39.6  MCV 104.3* 104.7* 104.8*  PLT 307 256 544   Basic Metabolic Panel: Recent Labs  Lab 07/16/17 0014 07/17/17 0533 07/18/17 0544 07/19/17 0540  NA 141 141 141 143  K 4.9 4.2 3.6 3.9  CL 105 105 102 104  CO2 _0 GLUCOSE 139* 140* 163* 113*  BUN _1 CREATININE 1.18 0.98 0.89 1.04  CALCIUM 9.4 8.8* 8.8* 8.7*    Liver Function Tests: No results for input(s): AST, ALT, ALKPHOS, BILITOT, PROT, ALBUMIN in the last 168 hours. No results for input(s): LIPASE, AMYLASE in  the last 168 hours. No results for input(s): AMMONIA in the last 168 hours. Coagulation Profile: No results for input(s): INR, PROTIME in the last 168 hours. Cardiac Enzymes: No results for input(s): CKTOTAL, CKMB, CKMBINDEX, TROPONINI in the last 168 hours. BNP (last 3 results) No results for input(s): PROBNP in the last 8760 hours. CBG: Recent Labs  Lab 07/18/17 1648 07/18/17 1943 07/18/17 2203 07/19/17 0732 07/19/17 1139  GLUCAP 164* 115* 147* 104* 171*   Studies: No results found.  Scheduled Meds: . aspirin EC  81 mg Oral Daily  . buPROPion  300 mg Oral QPC breakfast  . cholecalciferol  3,000 Units Oral Daily  . DULoxetine  30 mg Oral QHS  . DULoxetine  60 mg Oral Daily  . enoxaparin (LOVENOX) injection  40 mg Subcutaneous Q24H  . famotidine  20 mg Oral BID  . folic acid  1 mg Oral Daily  . gabapentin  900 mg Oral TID  . insulin aspart  0-9 Units Subcutaneous TID WC  . insulin glargine  12 Units Subcutaneous QHS  . lisinopril  20 mg Oral Daily  . lurasidone  20 mg Oral QHS  . mirtazapine  15 mg Oral QHS  . predniSONE  35 mg Oral Q breakfast  . senna-docusate  1 tablet Oral Daily  . silver sulfADIAZINE   Topical Daily  . tiotropium  18 mcg Inhalation Daily   Continuous Infusions: . vancomycin 1,500 mg (07/19/17 1014)   PRN Meds: acetaminophen **OR** acetaminophen, albuterol, ipratropium-albuterol, lidocaine, ondansetron **OR** ondansetron (ZOFRAN) IV, oxyCODONE-acetaminophen, polyvinyl alcohol, sodium chloride, traMADol  Time spent: 35 minutes  Author: [name], MD [facility] Pager: [phone] 07/19/2017 1:11 PM  If 7PM-7AM, please contact night-coverage at [email or web address], password [email or web address]

## 2017-07-19 NOTE — Progress Notes (Signed)
VASCULAR LAB PRELIMINARY  ARTERIAL  ABI completed:    RIGHT    LEFT    PRESSURE WAVEFORM  PRESSURE WAVEFORM  BRACHIAL 155 Triphasic BRACHIAL 153 Triphasic  DP - Biphasic DP - Biphasic  AT   AT    PT - Triphasic PT - Triphasic  PER   PER    GREAT TOE 130 NA GREAT TOE 100 NA    RIGHT LEFT  TBI 0.84 0.65   -Unable to obtain bilateral ABIs due to wound location and dressings. Bilateral posterior tibial artery waveforms are within normal limits at rest. The right TBI is within normal limits at rest. The left TBI is abnormal at rest.  07/19/2017 9:39 AM Maudry Mayhew, BS, RVT, RDCS, RDMS

## 2017-07-20 LAB — GLUCOSE, CAPILLARY
Glucose-Capillary: 68 mg/dL (ref 65–99)
Glucose-Capillary: 112 mg/dL — ABNORMAL HIGH (ref 65–99)
Glucose-Capillary: 137 mg/dL — ABNORMAL HIGH (ref 65–99)
Glucose-Capillary: 110 mg/dL — ABNORMAL HIGH (ref 65–99)
Glucose-Capillary: 106 mg/dL — ABNORMAL HIGH (ref 65–99)

## 2017-07-20 MED ORDER — INSULIN GLARGINE 100 UNIT/ML ~~LOC~~ SOLN
8.0000 [IU] | Freq: Every day | SUBCUTANEOUS | Status: DC
  Administered 2017-07-20: 8 [IU] via SUBCUTANEOUS
  Filled 2017-07-20 (×2): qty 0.08

## 2017-07-20 MED ORDER — DULOXETINE HCL 60 MG PO CPEP
60.0000 mg | ORAL_CAPSULE | Freq: Two times a day (BID) | ORAL | Status: DC
  Administered 2017-07-20 – 2017-07-21 (×2): 60 mg via ORAL
  Filled 2017-07-20 (×2): qty 1

## 2017-07-20 NOTE — Progress Notes (Signed)
Hypoglycemic Event  CBG: 68  Treatment:mild juice breakfast tray  Symptoms: none  Follow-up CBG: Time:0830 CBG Result:112  Possible Reasons for Event: unknown  Comments/MD notified:    Arletha Pili

## 2017-07-20 NOTE — Progress Notes (Signed)
Inpatient Diabetes Program Recommendations  AACE/ADA: New Consensus Statement on Inpatient Glycemic Control (2015)  Target Ranges:  Prepandial:   less than 140 mg/dL      Peak postprandial:   less than 180 mg/dL (1-2 hours)      Critically ill patients:  140 - 180 mg/dL   Results for Mario [name], Mario [name] (MRN [id number]) as of 07/20/2017 10:58  Ref. Range 07/20/2017 07:33  Glucose-Capillary Latest Ref Range: 65 - 99 mg/dL 68     Home DM Meds: Metformin 1000 mg BID  Current Orders: Lantus 12 units QHS       Novolog Sensitive Correction Scale/ SSI (0-9 units) TID AC       Patient receiving Prednisone 35 mg daily.   Mild Hypoglycemic event this AM after getting Lantus 12 units last PM.     MD- Please consider reducing Lantus to 6 units QHS    --Will follow patient during hospitalization--  Wyn Quaker RN, MSN, CDE Diabetes Coordinator Inpatient Glycemic Control Team Team Pager: [phone] (8a-5p)

## 2017-07-20 NOTE — Progress Notes (Addendum)
[facility] Progress Note  Patient: Mario [name] MMN:[id number]   PCP: [facility], Mario [name] DOB: Aug 19, 1950   DOA: 07/15/2017   DOS: 07/20/2017   Date of Service: the patient was seen and examined on 07/20/2017  Subjective: Patient continues to have sharp pain on the right leg.  No other complaints of chest pain nausea vomiting abdominal pain diarrhea constipation.  No other active bleeding.  No fever or chills.  Patient showed me the pictures off his leg before the problem initiated in April and after his discharge from the hospital.  Brief hospital course: Pt. with PMH of newly diagnosed type II DM, vasculitis, HLA-B27 positivity, active smoker, HTN, morbid obesity, depression, COPD, OSA; admitted on 07/15/2017, presented with complaint of leg pain, was found to have bilateral inflammatory ulcers.  Started on broad-spectrum antibiotics, infectious disease, plastic surgery both were consulted.  Information from [place] was requested, following documents are available in physical chart. dermatology follow-up on Jul 13, 2017. Diagnosis for leukocytoclastic vasculitis with MRSA infection Twice daily dressing changes with dakins ABI was planned to be performed on June 6. Wound care plan was soaking rolled gauze with dakins 0.125 solution then applied to both legs, apply AmLactin 12% solution to legs intact skin, apply Adaptic base to all wounds then try tach, cover with absorbent pad, wrap with 2 rolls of Kerlix, applied band neck to secure. Change twice weekly until healed. Based on the note dated 07/07/2017 the patient was using Tritec silver daily on his wound as opposed to weekly.  Rheumatology follow-up on 07/03/2017 Vasculitis rash initially was on Enbrel and methotrexate and MMF and patient was resumed back on these immunosuppressive medications. Prednisone was recommended to be tapered on 5 mg/week, on 35 mg on 07/05/2017. Mycophenolate 1500 mg twice daily, Enbrel weekly, methotrexate 6 tab  2.5 mg/week, folic acid 1 mg daily, ranitidine 150 mg twice daily  Currently further plan is continue antibiotics and follow-up on recommendation from plastic surgery.  Assessment and Plan: 1.  Bilateral lower extremity ulcer. Cellulitis versus active vasculitis causing ulceration. Recently hospitalized for similar situation, per patient he was treated with Keflex although notes from dermatology mentions about MRSA infection. 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 Started on steroids, methotrexate, CellCept, Enbrel. Dressing changes were recommended although the patient was mistakenly performing dressing changes daily instead of weekly. There is no documentation available, on follow-up the ulcers were getting better. Patient ran out of his pain medication and came to ER with worsening pain in his legs. Started on broad-spectrum antibiotics, infectious disease consulted switched to IV vancomycin, potentially can transition to oral Bactrim/doxycycline/clindamycin. Suspicion for is an active infection is less and appears to be more likely progressive inflammatory vasculitis causing ulceration. ABI cannot be performed due to presence of ulcers although flow appears triphasic in all the vessels. Plastic surgery was consulted, feels the patient may require debridement. ID was also consulted and has signed off although recommending the patient to be transferred to the [place] as all his work-up has been completed there. At present continue with current plan and follow-up on recommendation from plastic surgery. We will discuss with vascular surgery as well.  Patient showed me the pictures of the lesion before his admission, It did involve bilateral lower extremity with significant redness as well as black scabs over the ulcers. After his discharge from the hospital patient had yellow sloughing on the ulcer base with significant erythema of the surrounding  tissue and more swelling than current presentation.  Addendum: Discussed with plastic surgery, at present recommend to continue dressing changes, follow-up with rheumatology and dermatology as an outpatient.  No indication for debridement for now.  Mario [name] 2:23 PM 07/20/2017   2.  Type 2 diabetes mellitus. Neuropathy. Continue Lantus, continue sliding scale insulin, blood glucose well controlled. Continue gabapentin for neuropathy. Hemoglobin A1c checked in July 2018, will recheck tomorrow.  3.  Depression, bipolar disorder. Continue Cymbalta, Remeron, Latuda, Wellbutrin.  4. Morbid obesity, OSA. Continue CPAP at night.  5.  HLA-B27 uveitis. On his initial appointment with ophthalmology after diagnosis of diabetes patient was found to have HLA-B27 uveitis. Patient was referred to rheumatology and was started on methotrexate and Enbrel. His recent ulceration was thought to be secondary to leukocytoclastic vasculitis associated with that HLAB 27 disease.  And the patient was started on CellCept along with other medication.   Diet: Cardiac diet carb modified DVT Prophylaxis: subcutaneous Heparin  Advance goals of care discussion: full code  Family Communication: no family was present at bedside, at the time of interview.   Disposition:  Planning depends on plastic surgery consultation, also depends on patient's clinical progression.  Consultants: Infectious disease, plastic surgery Procedures: ABI  Antibiotics: Anti-infectives (From admission, onward)   Start     Dose/Rate Route Frequency Ordered Stop   07/18/17 1000  vancomycin (VANCOCIN) 1,500 mg in sodium chloride 0.9 % 500 mL IVPB     1,500 mg 250 mL/hr over 120 Minutes Intravenous Every 12 hours 07/18/17 0855     07/17/17 1200  vancomycin (VANCOCIN) 2,000 mg in sodium chloride 0.9 % 500 mL IVPB  Status:  Discontinued     2,000 mg 250 mL/hr over 120 Minutes Intravenous Every 24 hours 07/16/17 1021 07/18/17 0855     07/16/17 1100  vancomycin (VANCOCIN) 2,500 mg in sodium chloride 0.9 % 500 mL IVPB     2,500 mg 250 mL/hr over 120 Minutes Intravenous  Once 07/16/17 1021 07/16/17 1300   07/16/17 0930  vancomycin (VANCOCIN) IVPB 1000 mg/200 mL premix  Status:  Discontinued     1,000 mg 200 mL/hr over 60 Minutes Intravenous  Once 07/16/17 0922 07/16/17 1018   07/16/17 0600  clindamycin (CLEOCIN) IVPB 600 mg  Status:  Discontinued     600 mg 100 mL/hr over 30 Minutes Intravenous Every 8 hours 07/16/17 0238 07/16/17 0249   07/16/17 0600  clindamycin (CLEOCIN) IVPB 600 mg  Status:  Discontinued     600 mg 100 mL/hr over 30 Minutes Intravenous Every 8 hours 07/16/17 0256 07/16/17 0257   07/16/17 0300  doxycycline (VIBRAMYCIN) 100 mg in sodium chloride 0.9 % 250 mL IVPB  Status:  Discontinued     100 mg 125 mL/hr over 120 Minutes Intravenous Every 12 hours 07/16/17 0251 07/16/17 0251   07/16/17 0300  clindamycin (CLEOCIN) IVPB 600 mg  Status:  Discontinued     600 mg 100 mL/hr over 30 Minutes Intravenous Every 8 hours 07/16/17 0257 07/16/17 0808       Objective: Physical Exam: Vitals:   07/19/17 2115 07/20/17 0530 07/20/17 0847 07/20/17 0848  BP: 137/89 137/89    Pulse: 96 90    Resp: 17 14    Temp: (!) 97.5 F (36.4 C) 97.9 F (36.6 C)    TempSrc: Oral Oral    SpO2: 93% 98% 93% 93%  Weight:      Height:        Intake/Output Summary (Last 24 hours) at 07/20/2017 1003 Last data filed at 07/20/2017  5625 Gross per 24 hour  Intake 2520 ml  Output -  Net 2520 ml   Filed Weights   07/16/17 1011 07/17/17 0800  Weight: (!) 149.4 kg (329 lb 6.4 oz) (!) 146 kg (321 lb 14 oz)   General: Alert, Awake and Oriented to Time, Place and Person. Appear in mild distress, affect appropriate Eyes: PERRL, Conjunctiva normal ENT: Oral Mucosa clear moist. Neck: no JVD, no Abnormal Mass Or lumps Cardiovascular: S1 and S2 Present, no Murmur, Peripheral Pulses Present Respiratory: normal respiratory effort,  Bilateral Air entry equal and Decreased, no use of accessory muscle, Clear to Auscultation, no Crackles, no wheezes Abdomen: Bowel Sound present, Soft and no tenderness, no hernia Skin: Please see the pictures involving the lower extremity. No other significant rash anywhere in the body. Based on the information provided by the patient comparing the pictures prior to his admission as well as recent discharge from the hospital the redness is significantly better and the ulcers are stable from the day of admission this time.  Extremities: trace Pedal edema, generalized tenderness Neurologic: Grossly no focal neuro deficit. Bilaterally Equal motor strength Right foot medial aspect   Right foot lateral aspect    Left foot lateral aspect    Data Reviewed: CBC: Recent Labs  Lab 07/16/17 0014 07/17/17 0533 07/19/17 0540  WBC 13.5* 13.7* 13.7*  NEUTROABS 11.0*  --   --   HGB 14.4 12.9* 12.8*  HCT 43.9 39.7 39.6  MCV 104.3* 104.7* 104.8*  PLT 307 256 638   Basic Metabolic Panel: Recent Labs  Lab 07/16/17 0014 07/17/17 0533 07/18/17 0544 07/19/17 0540  NA 141 141 141 143  K 4.9 4.2 3.6 3.9  CL 105 105 102 104  CO2 '23 28 29 29  ' GLUCOSE 139* 140* 163* 113*  BUN '20 16 17 14  ' CREATININE 1.18 0.98 0.89 1.04  CALCIUM 9.4 8.8* 8.8* 8.7*    Liver Function Tests: No results for input(s): AST, ALT, ALKPHOS, BILITOT, PROT, ALBUMIN in the last 168 hours. No results for input(s): LIPASE, AMYLASE in the last 168 hours. No results for input(s): AMMONIA in the last 168 hours. Coagulation Profile: No results for input(s): INR, PROTIME in the last 168 hours. Cardiac Enzymes: No results for input(s): CKTOTAL, CKMB, CKMBINDEX, TROPONINI in the last 168 hours. BNP (last 3 results) No results for input(s): PROBNP in the last 8760 hours. CBG: Recent Labs  Lab 07/19/17 1139 07/19/17 1613 07/19/17 2114 07/20/17 0733 07/20/17 0841  GLUCAP 171* 170* 112* 68 112*   Studies: No  results found.  Scheduled Meds: . aspirin EC  81 mg Oral Daily  . buPROPion  300 mg Oral QPC breakfast  . cholecalciferol  3,000 Units Oral Daily  . DULoxetine  30 mg Oral QHS  . DULoxetine  60 mg Oral Daily  . enoxaparin (LOVENOX) injection  40 mg Subcutaneous Q24H  . famotidine  20 mg Oral BID  . folic acid  1 mg Oral Daily  . gabapentin  900 mg Oral TID  .  HYDROmorphone (DILAUDID) injection  1 mg Intravenous Q0600  . insulin aspart  0-9 Units Subcutaneous TID WC  . insulin glargine  12 Units Subcutaneous QHS  . lisinopril  20 mg Oral Daily  . lurasidone  20 mg Oral QHS  . mirtazapine  15 mg Oral QHS  . predniSONE  35 mg Oral Q breakfast  . senna-docusate  1 tablet Oral Daily  . silver sulfADIAZINE   Topical Daily  . tiotropium  18 mcg Inhalation Daily   Continuous Infusions: . vancomycin Stopped (07/20/17 0315)   PRN Meds: acetaminophen **OR** acetaminophen, albuterol, ipratropium-albuterol, lidocaine, ondansetron **OR** ondansetron (ZOFRAN) IV, oxyCODONE-acetaminophen, polyvinyl alcohol, sodium chloride, traMADol  Time spent: 35 minutes  Author: [name], MD [facility] Pager: [phone] 07/20/2017 10:03 AM  If 7PM-7AM, please contact night-coverage at [email or web address], password [facility]

## 2017-07-21 LAB — GLUCOSE, CAPILLARY
Glucose-Capillary: 89 mg/dL (ref 65–99)
Glucose-Capillary: 116 mg/dL — ABNORMAL HIGH (ref 65–99)

## 2017-07-21 LAB — HEMOGLOBIN A1C
Hgb A1c MFr Bld: 7.3 % — ABNORMAL HIGH (ref 4.8–5.6)
Mean Plasma Glucose: 162.81 mg/dL

## 2017-07-21 LAB — CREATININE, SERUM
Creatinine, Ser: 1 mg/dL (ref 0.61–1.24)
GFR calc Af Amer: >60 mL/min (ref >60)
GFR calc non Af Amer: >60 mL/min (ref >60)

## 2017-07-21 LAB — CULTURE, BLOOD (ROUTINE X 2)
Culture: NO GROWTH
Special Requests: ADEQUATE

## 2017-07-21 MED ORDER — DULOXETINE HCL 60 MG PO CPEP: 60.0000 mg | ORAL_CAPSULE | Freq: Two times a day (BID) | ORAL | 0 refills | Status: DC

## 2017-07-21 MED ORDER — DOXYCYCLINE HYCLATE 100 MG PO TABS: 100.0000 mg | ORAL_TABLET | Freq: Two times a day (BID) | ORAL | 0 refills | Status: DC

## 2017-07-21 MED ORDER — SILVER SULFADIAZINE 1 % EX CREA: TOPICAL_CREAM | Freq: Every day | CUTANEOUS | 0 refills | Status: DC

## 2017-07-21 MED ORDER — LIDOCAINE 5 % EX OINT: TOPICAL_OINTMENT | CUTANEOUS | 0 refills | Status: DC | PRN

## 2017-07-21 MED ORDER — OXYCODONE-ACETAMINOPHEN 7.5-325 MG PO TABS: 1.0000 | ORAL_TABLET | Freq: Four times a day (QID) | ORAL | 0 refills | Status: DC | PRN

## 2017-07-21 MED ORDER — METHOTREXATE 2.5 MG PO TABS: 15.0000 mg | ORAL_TABLET | ORAL | 0 refills | Status: DC

## 2017-07-21 MED ORDER — DOXYCYCLINE HYCLATE 100 MG PO TABS: 100.0000 mg | ORAL_TABLET | Freq: Two times a day (BID) | ORAL | Status: DC

## 2017-07-21 MED ORDER — DOXYCYCLINE HYCLATE 100 MG PO TABS: 100.0000 mg | ORAL_TABLET | Freq: Two times a day (BID) | ORAL | 0 refills | Status: AC

## 2017-07-21 MED FILL — OXYCODONE/APAP 7.5/325MG: 7.5-325 | 5 days supply | Qty: 20 | Fill #0

## 2017-07-21 MED FILL — DOXYCYCLINE HYCLATE 100 MG: 100 | 4 days supply | Qty: 8 | Fill #0

## 2017-07-21 NOTE — Progress Notes (Signed)
Consult-Transportation needs  CSW met with patient at bedside to determine patient transportation needs. Patient reports he does not have any family in [place] to help assist with his discharge. Patient reports he usually walks to his destination but is unable to do so today due to pain his feet and bandages (cellulitis of both feet). Patient does have funds to pay for his own taxi at this time. CSW provided patient with taxi voucher to his home listed in chart. Patient reports the address is within [facility].   Taxi voucher filled out given to nurse. No other needs identified.   Kathrin Greathouse, Latanya Presser, MSW Clinical Social Worker  [phone] 07/21/2017  2:07 PM

## 2017-07-21 NOTE — Progress Notes (Signed)
PT Cancellation Note  Patient Details Name: Jsean Taussig MRN: [id number] DOB: Aug 06, 1950   Cancelled Treatment:    Reason Eval/Treat Not Completed: Other (comment)(pt is about to DC.  Per SW, pt is able to ambulate and doesn't need PT prior to [place]. )   Philomena Doheny 07/21/2017, 2:18 PM [phone]

## 2017-07-21 NOTE — Progress Notes (Signed)
Discharge instructions given to patient. Bethann Punches RN

## 2017-07-21 NOTE — Progress Notes (Signed)
   Daily Progress Note  Asked to briefly review the chart in this patient with known vasculitis managed by [facility].  I reviewed the ABI and there is multiphasic flow into both feet.  R TBI is normal with L TBI nearing normal also, so I suspect his wounds are related to his known vasculitis.  I do not routine manage vasculitis medically, so I would defer to his current Dermatology and Rheumatology consultants.  I do not think I have anything to add to his care currently.  Thank you for the call.   Adele Barthel, MD, FACS [facility] Office: [phone] Pager: [phone]  07/21/2017, 9:19 AM

## 2017-07-21 NOTE — Care Management Note (Signed)
Case Management Note  Patient Details  Name: Mourad Cwikla MRN: [id number] Date of Birth: 1950/10/04  Subjective/Objective: Received call from attending about d/c home w/HHC. Patient active w/[facility]-HHRN-dsg changes rep Ronalee Belts aware of [facility] order,await face to face-MD notified. [facility]-patient will have to go to [place] clinic for scripts to be filled, & for ongoing transportation issues through the [place] CSW-I have left a message w/my call back to the [place] CSW-Bertina. Our CSW will provide patient w/a taxi voucher to get to the [place] @ d/c. No further CM needs.                 Action/Plan:d/c home w/HHC   Expected Discharge Date:  07/21/17               Expected Discharge Plan:  [place]  In-House Referral:     Discharge planning Services  CM Consult  Post Acute Care Choice:  Home Health(Active w/[facility] -HHRN-dsg changes.) Choice offered to:  Patient  DME Arranged:    DME Agency:     HH Arranged:  RN [place] Agency:  [facility] (formerly [employer])  Status of Service:  Completed, signed off  If discussed at [employer], dates discussed:    Additional Comments:  Dessa Phi, RN 07/21/2017, 11:52 AM

## 2017-07-21 NOTE — Evaluation (Signed)
Occupational Therapy Evaluation Patient Details Name: Mario [name] MRN: [id number] DOB: 1950/06/18 Today's Date: 07/21/2017    History of Present Illness Pt. with PMH of newly diagnosed type II DM, vasculitis, HLA-B27 positivity, active smoker, HTN, morbid obesity, depression, COPD, OSA; admitted on 07/15/2017, presented with complaint of leg pain, was found to have bilateral inflammatory ulcers   Clinical Impression   Pt admitted with bilateral foot ulcers. Pt currently with functional limitations due to the deficits listed below (see OT Problem List).  Pt will benefit from skilled OT to increase their safety and independence with ADL and functional mobility for ADL to facilitate discharge to venue listed below.      Follow Up Recommendations  No OT follow up    Equipment Recommendations  None recommended by OT       Precautions / Restrictions Precautions Precautions: Fall      Mobility Bed Mobility Overal bed mobility: Needs Assistance Bed Mobility: Supine to Sit     Supine to sit: Supervision        Transfers Overall transfer level: Needs assistance Equipment used: Straight cane Transfers: Sit to/from Stand;Stand Pivot Transfers Sit to Stand: Supervision Stand pivot transfers: Supervision       General transfer comment: VC for safety. Pt did walk to bathroom with a cane    Balance Overall balance assessment: Mild deficits observed, not formally tested                                         ADL either performed or assessed with clinical judgement   ADL Overall ADL's : Needs assistance/impaired Eating/Feeding: Set up;Sitting   Grooming: Set up;Standing   Upper Body Bathing: Set up;Sitting   Lower Body Bathing: Minimal assistance;Sit to/from stand;Cueing for sequencing   Upper Body Dressing : Set up;Sitting   Lower Body Dressing: Minimal assistance;Sit to/from stand   Toilet Transfer: Min guard;Ambulation Toilet Transfer Details  (indicate cue type and reason): cane Toileting- Clothing Manipulation and Hygiene: Supervision/safety;Sit to/from stand;Cueing for safety;Cueing for sequencing         General ADL Comments: Pt walked to the bathroom. Pt had bandages on his feet and would not try to don socks.  Pt does have darco shoes but he states he doesnt wear often     Vision Patient Visual Report: No change from baseline              Pertinent Vitals/Pain Pain Assessment: 0-10 Pain Score: 8  Pain Descriptors / Indicators: Discomfort;Sore Pain Intervention(s): Limited activity within patient's tolerance;Repositioned     Hand Dominance     Extremity/Trunk Assessment Upper Extremity Assessment Upper Extremity Assessment: Generalized weakness           Communication Communication Communication: No difficulties   Cognition Arousal/Alertness: Awake/alert Behavior During Therapy: WFL for tasks assessed/performed Overall Cognitive Status: Within Functional Limits for tasks assessed                                                Home Living Family/patient expects to be discharged to:: Private residence Living Arrangements: Non-relatives/Friends   Type of Home: House Home Access: Stairs to enter Technical brewer of Steps: 2   Home Layout: One level     Bathroom Shower/Tub: Tub/shower unit  Bathroom Toilet: Handicapped height     Home Equipment: [place] - single point;Walker - 2 wheels          Prior Functioning/Environment Level of Independence: Independent with assistive device(s)        Comments: not driving        OT Problem List: Decreased strength;Pain;Obesity;Decreased knowledge of use of DME or AE      OT Treatment/Interventions: Self-care/ADL training;Patient/family education;DME and/or AE instruction    OT Goals(Current goals can be found in the care plan section) Acute Rehab OT Goals Patient Stated Goal: home OT Goal Formulation: With  patient Time For Goal Achievement: 07/28/17 Potential to Achieve Goals: Good  OT Frequency: Min 2X/week              AM-PAC PT "6 Clicks" Daily Activity     Outcome Measure Help from another person eating meals?: None Help from another person taking care of personal grooming?: None Help from another person toileting, which includes using toliet, bedpan, or urinal?: A Little Help from another person bathing (including washing, rinsing, drying)?: A Little Help from another person to put on and taking off regular upper body clothing?: None Help from another person to put on and taking off regular lower body clothing?: A Little 6 Click Score: 21   End of Session Nurse Communication: Mobility status  Activity Tolerance: Patient tolerated treatment well Patient left: in bed(sitting EOB)  OT Visit Diagnosis: Unsteadiness on feet (R26.81);Pain Pain - part of body: (feet)                Time: 8350-7573 OT Time Calculation (min): 16 min Charges:  OT General Charges $OT Visit: 1 Visit OT Evaluation $OT Eval Moderate Complexity: 1 Mod G-Codes:     Mario [name], Mario [name]  Mario [name] 07/21/2017, 10:45 AM

## 2017-07-22 NOTE — Discharge Summary (Signed)
[facility] Discharge Summary   Patient: Mario [name] ESP:[id number]   PC[facility]: [facility] DOB: 11/16/1950   Date of admission: 07/15/2017   Date of discharge: 07/21/2017   Discharge Diagnoses:  Principal Problem:   Cellulitis of both feet Active Problems:   HTN (hypertension)   DM2 (diabetes mellitus, type 2) ([place])   HLA B27 (HLA B27 positive)   Admitted From: home Disposition:  Home with home health  Recommendations for Outpatient Follow-up:  1. Please follow-up in 1 week 2. Follow-up with dermatology regarding changes in 1 week 3. Follow-up with dermatology regarding his medication for vasculitis 4. Continue dressing changes daily as recommended on discharge.  Follow-up Information    [facility], [facility]. Schedule an appointment as soon as possible for a visit in 1 week(s).   Contact information: [place] [address] [phone]        rheumatology. Schedule an appointment as soon as possible for a visit in 1 week(s).   Why:  discuss about resuming medication        dermatology. Schedule an appointment as soon as possible for a visit in 1 week(s).   Why:  discuss about wound care.        Home, [facility] up.   Specialty:  Home Health Services Why:  [facility] Contact information: [address] [place] [address] [phone]          Diet recommendation: cardiac diet  Activity: The patient is advised to gradually reintroduce usual activities.  Discharge Condition: good  Code Status: full code  History of present illness: As per the H and P dictated on admission, "Mario [name] is a 67 y.o. male with medical history significant of HLA-B27, uveitis.  Patient is normally on prednisone and MTX.  Patient developed painful red bumps on forearms and feet in Jan-Feb.  Sounds like he was treated with prednisone, MTX, and possibly mycophenolate for a time (not currently).  Bumps on  forearm went away but developed weeping sores on feet.   His diagnostic work up was at the [facility] and has included Derm consult, Rheum consult, and skin biopsy x3.  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  These have persisted despite a recent course of keflex.  He was told to hold MTX while he was on Keflex. So this has been on hold.  Feet are severely painful.  He is out of pain meds."  Hospital Course:  Summary of his active problems in the hospital is as following. 1.Bilateral lower extremity ulcers. 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 Started on steroids and CellCept, was already on Enbrel, methotrexate for his HLA-B27 uveitis history. Dressing changes were recommended weekly with silver, although the patient was mistakenly performing dressing changes daily instead of weekly. Patient ran out of his pain medication and came to ER with worsening pain in his legs. Started on broad-spectrum antibiotics, infectious disease consulted switched to IV vancomycin. Suspicion for is an active infection is less and appears to be more likely progressive inflammatory vasculitis causing ulceration. ABI cannot be performed due to presence of ulcers although flow appears triphasic in all the vessels. Plastic surgery was consulted, initially they felt the patient may require debridement.  Later on, recommended to  continue dressing changes, follow-up with rheumatology and dermatology as an outpatient.  No indication for debridement for now.  Patient showed me the pictures of the lesion before his admission, It did involve bilateral lower extremity with significant redness as well as black scabs over the  ulcers. After his discharge from the hospital patient had yellow sloughing on the ulcer base with significant erythema of the surrounding tissue and more swelling than current presentation.  Discussed with vascular surgery and at present no recommendation continue follow-up with rheumatology for vasculitis. Appreciate assistance from ID, plastic surgery and vascular surgery in patient's management.  2.Type 2 diabetes mellitus. Neuropathy. Continue Lantus, continue sliding scale insulin, blood glucose well controlled. Continue gabapentin for neuropathy.  Cymbalta dose increased from 30 and 60 to 60 twice daily Hemoglobin A1c checked in July 2018, recheck here is 7.3  3.Depression, bipolar disorder. Continue Cymbalta, Remeron, Latuda, Wellbutrin.  4.Morbid obesity, OSA. Continue CPAP at night.  5.  HLA-B27 uveitis. On his initial appointment with ophthalmology after diagnosis of diabetes patient was found to have HLA-B27 uveitis. Patient was referred to rheumatology and was started on methotrexate and Enbrel. His recent ulceration was thought to be secondary to leukocytoclastic vasculitis associated with that HLAB 27 disease.  And the patient was started on CellCept along with other medication.  All other chronic medical condition were stable during the hospitalization.  Patient was ambulatory without any assistance, patient feels that he does not feel that he will need physical therapy.  Patient does home health vacuum joint which was resumed on discharge for dressing changes. Case manager and social worker consulted for assistance with medication and transportation. On the day of the discharge the patient's vitals were stable , and no other acute medical condition were reported by patient. the patient was felt safe to be discharge at home with home health.  Consultants: ID, plastic surgery, phone consultation with vascular surgery Procedures: ABI  DISCHARGE  MEDICATION: Allergies as of 07/21/2017      Reactions   Demerol [meperidine] Nausea And Vomiting   Violently sick   Zocor [simvastatin] Other (See Comments)   Made him very jittery, nausea and vomiting      Medication List    TAKE these medications   acetaminophen 325 MG tablet Commonly known as:  TYLENOL Take 975 mg by mouth every 8 (eight) hours as needed for mild pain, moderate pain or headache.   ARTIFICIAL TEARS OP Place 1 drop into both eyes daily as needed (dry eyes).   aspirin EC 81 MG tablet Take 81 mg by mouth daily.   buPROPion 300 MG 24 hr tablet Commonly known as:  WELLBUTRIN XL Take 300 mg by mouth daily after breakfast.   cholecalciferol 1000 units tablet Commonly known as:  VITAMIN D Take 3,000 Units by mouth daily.   COMBIVENT RESPIMAT 20-100 MCG/ACT Aers respimat Generic drug:  Ipratropium-Albuterol Inhale 1-2 puffs into the lungs every 6 (six) hours as needed for wheezing.   doxycycline 100 MG tablet Commonly known as:  VIBRA-TABS Take 1 tablet (100 mg total) by mouth every 12 (twelve) hours for 4 days.   DULoxetine 60 MG capsule Commonly known as:  CYMBALTA Take 1 capsule (60 mg total) by mouth 2 (two) times daily. What changed:    medication strength  how much to take  when to take this   folic acid 1 MG tablet Commonly known as:  FOLVITE Take 1 mg by mouth daily.   gabapentin 300 MG capsule Commonly known  as:  NEURONTIN Take 2 capsules (600 mg total) by mouth 2 (two) times daily. What changed:    how much to take  when to take this   lidocaine 5 % ointment Commonly known as:  XYLOCAINE Apply topically as needed (at dressing changes).   lisinopril 20 MG tablet Commonly known as:  PRINIVIL,ZESTRIL Take 1 tablet (20 mg total) by mouth daily. For high blood pressure What changed:  additional instructions   lurasidone 20 MG Tabs tablet Commonly known as:  LATUDA Take 20 mg by mouth at bedtime.   metFORMIN 1000 MG  tablet Commonly known as:  GLUCOPHAGE Take 1,000 mg by mouth 2 (two) times daily with a meal.   methotrexate 2.5 MG tablet Commonly known as:  RHEUMATREX Take 6 tablets (15 mg total) by mouth every Saturday. Hold until seen by rheumatology  Caution:Chemotherapy. Protect from light. What changed:  additional instructions   mirtazapine 15 MG tablet Commonly known as:  REMERON Take 15 mg by mouth at bedtime.   Oxycodone HCl 10 MG Tabs Take 10 mg by mouth every 4-6 hours PRN severe pain. What changed:    how much to take  how to take this  when to take this  reasons to take this  additional instructions   oxyCODONE-acetaminophen 7.5-325 MG tablet Commonly known as:  PERCOCET Take 1 tablet by mouth every 6 (six) hours as needed for severe pain.   predniSONE 5 MG tablet Commonly known as:  DELTASONE Take 35 mg by mouth daily with breakfast.   PRESCRIPTION MEDICATION Take 1 application by mouth daily as needed (foot itching). Anti-fungal cream with moisturizer from [facility] into the lungs at bedtime. CPAP   PROAIR HFA 108 (90 Base) MCG/ACT inhaler Generic drug:  albuterol Inhale 2 puffs into the lungs every 6 (six) hours as needed for wheezing or shortness of breath.   ranitidine 150 MG tablet Commonly known as:  ZANTAC Take 150 mg by mouth 2 (two) times daily.   sennosides-docusate sodium 8.6-50 MG tablet Commonly known as:  SENOKOT-S Take 1 tablet by mouth daily.   silver sulfADIAZINE 1 % cream Commonly known as:  SILVADENE Apply topically daily.   sodium chloride 0.65 % Soln nasal spray Commonly known as:  OCEAN Place 2 sprays into both nostrils 4 (four) times daily as needed for congestion.   tiotropium 18 MCG inhalation capsule Commonly known as:  SPIRIVA Place 1 capsule (18 mcg total) into inhaler and inhale daily.   traMADol 50 MG tablet Commonly known as:  ULTRAM Take 100 mg by mouth every 8 (eight) hours as needed for  moderate pain or severe pain.            Discharge Care Instructions  (From admission, onward)        Start     Ordered   07/21/17 0000  Discharge wound care:    Comments:  Apply to bilateral lower leg wounds.  Cover with non-adhering product such as vaseline gauzes, xeroform gauzes, telfa pads. If areas are weeping, then cover the non-adhering dressings with ABD pads.  Secure with kerlex. Change daily.   07/21/17 1146     Allergies  Allergen Reactions  . Demerol [Meperidine] Nausea And Vomiting    Violently sick  . Zocor [Simvastatin] Other (See Comments)    Made him very jittery, nausea and vomiting   Discharge Instructions    Diet - low sodium heart healthy   Complete by:  As directed  Discharge wound care:   Complete by:  As directed    Apply to bilateral lower leg wounds.  Cover with non-adhering product such as vaseline gauzes, xeroform gauzes, telfa pads. If areas are weeping, then cover the non-adhering dressings with ABD pads.  Secure with kerlex. Change daily.   Increase activity slowly   Complete by:  As directed      Discharge Exam: Filed Weights   07/16/17 1011 07/17/17 0800  Weight: (!) 149.4 kg (329 lb 6.4 oz) (!) 146 kg (321 lb 14 oz)   Vitals:   07/21/17 0520 07/21/17 0805  BP: (!) 149/101   Pulse: 78   Resp: 16   Temp: 98 F (36.7 C)   SpO2: 100% 100%   General: Appear in no distress, no Rash; Oral Mucosa moist. Cardiovascular: S1 and S2 Present, no Murmur, no JVD Respiratory: Bilateral Air entry present and Clear to Auscultation, no Crackles, no wheezes Abdomen: Bowel Sound present, Soft and no tenderness Extremities: trwce Pedal edema, bilateral lower extremity tenderness, multiple diffuse sloughing bilateral lower extremity ulcer unchanged from admission, no calf tenderness Neurology: Grossly no focal neuro deficit.  The results of significant diagnostics from this hospitalization (including imaging, microbiology, ancillary and  laboratory) are listed below for reference.    Significant Diagnostic Studies: No results found.  Microbiology: Recent Results (from the past 240 hour(s))  Wound or Superficial Culture     Status: None   Collection Time: 07/16/17 12:14 AM  Result Value Ref Range Status   Specimen Description   Final    FOOT RIGHT Performed at [facility], [address], [place], [place] [address]    Special Requests   Final    NONE Performed at [facility], [place] [address] [place], [place] [address]    Gram Stain NO WBC SEEN NO ORGANISMS SEEN   Final   Culture   Final    NORMAL SKIN FLORA Performed at [facility], [place] [address], [place], [place] [address]    Report Status 07/18/2017 FINAL  Final  Culture, blood (Routine X 2) w Reflex to ID Panel     Status: None   Collection Time: 07/16/17  3:59 PM  Result Value Ref Range Status   Specimen Description   Final    BLOOD RIGHT HAND Performed at [facility], [place] [address], [place], [place] [address]    Special Requests   Final    BOTTLES DRAWN AEROBIC ONLY Blood Culture adequate volume Performed at [facility] [address], [place], [place] [address]    Culture   Final    NO GROWTH 5 DAYS Performed at [facility], [place] [address] [place], [place] [address]    Report Status 07/21/2017 FINAL  Final     Labs: CBC: Recent Labs  Lab 07/16/17 0014 07/17/17 0533 07/19/17 0540  WBC 13.5* 13.7* 13.7*  NEUTROABS 11.0*  --   --   HGB 14.4 12.9* 12.8*  HCT 43.9 39.7 39.6  MCV 104.3* 104.7* 104.8*  PLT 307 256 353   Basic Metabolic Panel: Recent Labs  Lab 07/16/17 0014 07/17/17 0533 07/18/17 0544 07/19/17 0540 07/21/17 0535  NA 141 141 141 143  --   K 4.9 4.2 3.6 3.9  --   CL 105 105 102 104  --   CO2 '23 28 29 29  ' --   GLUCOSE 139* 140* 163* 113*  --   BUN 20 16 17  14  --   CREATININE 1.18 0.98 0.89 1.04 1.00  CALCIUM 9.4 8.8* 8.8* 8.7*  --    BNP (last  3 results) Recent Labs    09/17/16 1555  BNP 12.3   CBG: Recent Labs  Lab 07/20/17 1145 07/20/17 1635 07/20/17 2043 07/21/17 0721 07/21/17 1205  GLUCAP 137* 110* 106* 89 116*   Time spent: 35 minutes  Signed:  [name]  [facility] 07/21/2017 , 8:41 AM

## 2017-07-23 ENCOUNTER — Encounter (HOSPITAL_COMMUNITY): Payer: Self-pay | Admitting: Emergency Medicine

## 2017-07-23 ENCOUNTER — Emergency Department (HOSPITAL_COMMUNITY)
Admission: EM | Admit: 2017-07-23 | Discharge: 2017-07-24 | Disposition: A | Discharge status: Home / Self Care | Payer: Non-veteran care | Attending: Emergency Medicine | Admitting: Emergency Medicine

## 2017-07-23 DIAGNOSIS — I10 Essential (primary) hypertension: Secondary | ICD-10-CM | POA: Diagnosis not present

## 2017-07-23 DIAGNOSIS — Z7984 Long term (current) use of oral hypoglycemic drugs: Secondary | ICD-10-CM | POA: Diagnosis not present

## 2017-07-23 DIAGNOSIS — M79671 Pain in right foot: Secondary | ICD-10-CM | POA: Insufficient documentation

## 2017-07-23 DIAGNOSIS — Z79899 Other long term (current) drug therapy: Secondary | ICD-10-CM | POA: Insufficient documentation

## 2017-07-23 DIAGNOSIS — Z8679 Personal history of other diseases of the circulatory system: Secondary | ICD-10-CM | POA: Insufficient documentation

## 2017-07-23 DIAGNOSIS — F17210 Nicotine dependence, cigarettes, uncomplicated: Secondary | ICD-10-CM | POA: Diagnosis not present

## 2017-07-23 DIAGNOSIS — J449 Chronic obstructive pulmonary disease, unspecified: Secondary | ICD-10-CM | POA: Insufficient documentation

## 2017-07-23 DIAGNOSIS — E119 Type 2 diabetes mellitus without complications: Secondary | ICD-10-CM | POA: Insufficient documentation

## 2017-07-23 DIAGNOSIS — M792 Neuralgia and neuritis, unspecified: Secondary | ICD-10-CM | POA: Diagnosis not present

## 2017-07-23 MED ORDER — HYDROMORPHONE HCL 1 MG/ML IJ SOLN
1.0000 mg | Freq: Once | INTRAMUSCULAR | Status: AC
  Administered 2017-07-24: 1 mg via INTRAVENOUS
  Filled 2017-07-23: qty 1

## 2017-07-23 NOTE — ED Triage Notes (Signed)
Patient here from home via [facility] with complaints of bilateral foot pain, wound is being treated at wound center. Feet wrapped and bandaged by [facility]. Reports that he has shooting pain increased in right foot radiating to toe. Also states that "if I dont get any pain control im going to go home and shoot up all my insulin".

## 2017-07-23 NOTE — ED Provider Notes (Signed)
[facility] DEPT Provider Note   CSN: [id number] Arrival date & time: 07/23/17  2106     History   Chief Complaint Chief Complaint  Patient presents with  . Wound Infection  . Foot Pain    HPI Mario [name] is a 67 y.o. male.  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  The patient was discharged 2 days ago after a 6-day admission for management of foot wounds.  He has continued to have his wounds bandaged by [facility], but states that he is unable to maintain dressings on his right foot secondary to sharp, shooting, burning pains.  This pain is been fairly constant and has been worsening over the past 3 weeks.  The patient has tried applying topical lidocaine which provides very little temporary relief.  He has continued with 7.5 mg Percocet tablets in addition to tramadol, but this has not resulted in adequate pain control.  He states that he contemplates taking all of his insulin and "just going to sleep" because the pain is so severe.  He has not had any fever since discharge.  He was placed on a 4-day course of doxycycline with which she has remained compliant.  Symptoms previously managed by the [facility].  The history is provided by the patient. No language interpreter was used.  Foot Pain     Past Medical History:  Diagnosis Date  . Anxiety   . Bronchitis   . COPD (chronic obstructive pulmonary disease) ([place])   . Depression   . Hypertension   . Mental disorder   . MI (myocardial infarction) ([place])    ????  . OSA (obstructive sleep apnea)   . Suicide attempt ([place])   . Tension pneumothorax 06/27/2016    Patient Active Problem List   Diagnosis Date Noted  . Cellulitis of both feet 07/16/2017  . DM2 (diabetes mellitus, type 2) ([place]) 07/16/2017  . HLA B27 (HLA B27 positive) 07/16/2017  . Acute chest pain    . Hyperkalemia   . Spontaneous pneumothorax 06/27/2016  . Hypoxia   . Pneumothorax on left   . Dyspnea 05/23/2014  . COPD exacerbation ([place]) 05/23/2014  . Malingering 01/21/2013  . MDD (major depressive disorder), recurrent severe, without psychosis ([place]) 01/13/2013  . Depression 01/11/2013  . Tobacco abuse 01/11/2013  . Obesity, unspecified 01/11/2013  . Chest pain 01/10/2013  . HTN (hypertension) 01/10/2013    Past Surgical History:  Procedure Laterality Date  . CHEST TUBE INSERTION Left 06/27/2016  . cryptorchidism    . SKIN CANCER EXCISION          Home Medications    Prior to Admission medications   Medication Sig Start Date End Date Taking? Authorizing Provider  acetaminophen (TYLENOL) 325 MG tablet Take 975 mg by mouth every 8 (eight) hours as needed for mild pain, moderate pain or headache.   Yes [provider]  albuterol (PROAIR HFA) 108 (90 Base) MCG/ACT inhaler Inhale 2 puffs into the lungs every 6 (six) hours as needed for wheezing or shortness of breath.   Yes [provider]  aspirin EC 81 MG tablet Take 81 mg by mouth daily.   Yes [provider]  buPROPion (WELLBUTRIN XL) 300 MG 24 hr tablet Take 300 mg by mouth daily after breakfast.    Yes [provider]  cholecalciferol (VITAMIN D) 1000 units tablet  Take 3,000 Units by mouth daily.   Yes [provider]  doxycycline (VIBRA-TABS) 100 MG tablet Take 1 tablet (100 mg total) by mouth every 12 (twelve) hours for 4 days. 07/21/17 07/25/17 Yes Lavina Hamman, MD  DULoxetine (CYMBALTA) 60 MG capsule Take 1 capsule (60 mg total) by mouth 2 (two) times daily. 07/21/17  Yes Lavina Hamman, MD  folic acid (FOLVITE) 1 MG tablet Take 1 mg by mouth daily.   Yes [provider]  gabapentin (NEURONTIN) 300 MG capsule Take 2 capsules (600 mg total) by mouth 2 (two) times daily. Patient taking differently: Take 900 mg by mouth 3 (three) times daily.  07/06/16  Yes Tacy Dura,  Donielle M, PA-C  Hypromellose (ARTIFICIAL TEARS OP) Place 1 drop into both eyes daily as needed (dry eyes).   Yes [provider]  Ipratropium-Albuterol (COMBIVENT RESPIMAT) 20-100 MCG/ACT AERS respimat Inhale 1-2 puffs into the lungs every 6 (six) hours as needed for wheezing.    Yes [provider]  lisinopril (PRINIVIL,ZESTRIL) 20 MG tablet Take 1 tablet (20 mg total) by mouth daily. For high blood pressure Patient taking differently: Take 20 mg by mouth daily.  01/21/13  Yes Mashburn, Marlane Hatcher, PA-C  lurasidone (LATUDA) 20 MG TABS tablet Take 20 mg by mouth at bedtime.   Yes [provider]  metFORMIN (GLUCOPHAGE) 1000 MG tablet Take 1,000 mg by mouth 2 (two) times daily with a meal.   Yes [provider]  methotrexate (RHEUMATREX) 2.5 MG tablet Take 6 tablets (15 mg total) by mouth every Saturday. Hold until seen by rheumatology  Caution:Chemotherapy. Protect from light. 07/22/17  Yes Lavina Hamman, MD  mirtazapine (REMERON) 15 MG tablet Take 15 mg by mouth at bedtime.   Yes [provider]  oxyCODONE-acetaminophen (PERCOCET) 7.5-325 MG tablet Take 1 tablet by mouth every 6 (six) hours as needed for severe pain. 07/21/17  Yes Lavina Hamman, MD  predniSONE (DELTASONE) 5 MG tablet Take 35 mg by mouth daily with breakfast.    Yes [provider]  PRESCRIPTION MEDICATION Take 1 application by mouth daily as needed (foot itching). Anti-fungal cream with moisturizer from [place]   Yes [provider]  [place] into the lungs at bedtime. CPAP   Yes [provider]  ranitidine (ZANTAC) 150 MG tablet Take 150 mg by mouth 2 (two) times daily.   Yes [provider]  sennosides-docusate sodium (SENOKOT-S) 8.6-50 MG tablet Take 1 tablet by mouth daily.    Yes [provider]  sodium chloride (OCEAN) 0.65 % SOLN nasal spray Place 2 sprays into both nostrils 4 (four) times daily as needed for congestion.    Yes [provider]  tiotropium (SPIRIVA) 18 MCG inhalation capsule Place 1 capsule (18 mcg total) into inhaler and inhale daily. 05/26/14  Yes Mikhail, Velta Addison, DO  traMADol (ULTRAM) 50 MG tablet Take 100 mg by mouth every 8 (eight) hours as needed for moderate pain or severe pain.   Yes [provider]  lidocaine (XYLOCAINE) 5 % ointment Apply topically as needed (at dressing changes). 07/21/17   Lavina Hamman, MD  oxyCODONE 10 MG TABS Take 10 mg by mouth every 4-6 hours PRN severe pain. Patient not taking: Reported on 07/24/2017 07/06/16   Nani Skillern, PA-C  silver sulfADIAZINE (SILVADENE) 1 % cream Apply topically daily. 07/21/17   Lavina Hamman, MD    Family History Family History  Problem Relation Age of Onset  .  Dementia Father     Social History Social History   Tobacco Use  . Smoking status: Current Every Day Smoker    Packs/day: 0.50    Years: 20.00    Pack years: 10.00    Types: Cigarettes  . Smokeless tobacco: Never Used  Substance Use Topics  . Alcohol use: No    Alcohol/week: 0.0 oz    Comment: denies use of any drugs or alcohol  . Drug use: No     Allergies   Demerol [meperidine] and Zocor [simvastatin]   Review of Systems Review of Systems Ten systems reviewed and are negative for acute change, except as noted in the HPI.    Physical Exam Updated Vital Signs BP 118/80   Pulse (!) 101   Temp 98.2 F (36.8 C) (Oral)   Resp 16   SpO2 90%   Physical Exam  Constitutional: He is oriented to person, place, and time. He appears well-developed and well-nourished. No distress.  Nontoxic and in NAD  HENT:  Head: Normocephalic and atraumatic.  Eyes: Conjunctivae and EOM are normal. No scleral icterus.  Neck: Normal range of motion.  Cardiovascular: Normal rate, regular rhythm and intact distal pulses.  DP pulse 1+ in the RLE  Pulmonary/Chest: Effort normal. No respiratory distress.  Respirations even and unlabored    Musculoskeletal: Normal range of motion.  Edema to the RLE. Extremity is erythematous with multiple ulcerative lesions. No active drainage. Diffuse TTP. No crepitus or deformity.  Neurological: He is alert and oriented to person, place, and time. He exhibits normal muscle tone. Coordination normal.  Skin: Skin is warm and dry. No rash noted. He is not diaphoretic. No erythema. No pallor.  Psychiatric: He has a normal mood and affect. His behavior is normal.  Nursing note and vitals reviewed.          ED Treatments / Results  Labs (all labs ordered are listed, but only abnormal results are displayed) Labs Reviewed  CBC - Abnormal; Notable for the following components:      Result Value   WBC 13.9 (*)    RBC 4.15 (*)    MCV 103.4 (*)    MCH 34.2 (*)    All other components within normal limits  BASIC METABOLIC PANEL - Abnormal; Notable for the following components:   Glucose, Bld 127 (*)    BUN 22 (*)    Creatinine, Ser 1.25 (*)    GFR calc non Af Amer 59 (*)    All other components within normal limits    EKG None  Radiology No results found.  Procedures Procedures (including critical care time)  Medications Ordered in ED Medications  HYDROmorphone (DILAUDID) injection 1 mg (has no administration in time range)  HYDROmorphone (DILAUDID) injection 1 mg (1 mg Intravenous Given 07/24/17 0003)  oxyCODONE-acetaminophen (PERCOCET/ROXICET) 5-325 MG per tablet 2 tablet (2 tablets Oral Given 07/24/17 0134)  HYDROmorphone (DILAUDID) injection 1 mg (1 mg Intravenous Given 07/24/17 0300)  lidocaine (XYLOCAINE) 5 % ointment (1 application Topical Given 07/24/17 0518)     Initial Impression / Assessment and Plan / ED Course  I have reviewed the triage vital signs and the nursing notes.  Pertinent labs & imaging results that were available during my care of the patient were reviewed by me and considered in my medical decision making (see chart for details).     67 year old  male presents the emergency department for evaluation of foot pain.  This is associated with ulcerative lesions secondary to  known vasculitis.  Patient was discharged 2 days ago after a 6-day hospitalization for symptomatic management.  He has previously been followed by the [facility]; numerous specialists involved in the patient's care.  Patient with stable leukocytosis.  To this chronic leukocytosis may be secondary to ongoing steroid use.  It has not significantly worsened since discharge.  The patient does have erythema surrounding his wounds, though this is thought to be representative of inflammatory response rather than acute infection.  He is currently on a course of doxycycline which he reports full compliance with.  Compartments of bilateral lower extremities are soft.  Patient afebrile in the ED today.  Patient is mostly concerned about pain to his bilateral feet associated with wound sites.  Based on characteristics of the pain, this is thought to be nerve related.  I have explained to the patient that this is often difficult to control.  He is already on both Percocet and tramadol and may benefit from future follow-up with pain management.  The pain has been managed with Dilaudid and Percocet while observed in the emergency department.  I do not see current indication for further emergent work-up or admission at this time.  The patient has been encouraged to follow-up with his outpatient specialist, but has been told to return for any new or concerning symptoms.  Return precautions discussed and provided. Patient discharged in stable condition with no unaddressed concerns.   Final Clinical Impressions(s) / ED Diagnoses   Final diagnoses:  Foot pain, right  History of vasculitis  Neuropathic pain    ED Discharge Orders    None       Antonietta Breach, PA-C 07/24/17 5300    Ezequiel Essex, MD 07/24/17 [phone]

## 2017-07-24 LAB — CBC
HCT: 42.9 % (ref 39.0–52.0)
Hemoglobin: 14.2 g/dL (ref 13.0–17.0)
MCH: 34.2 pg — ABNORMAL HIGH (ref 26.0–34.0)
MCHC: 33.1 g/dL (ref 30.0–36.0)
MCV: 103.4 fL — ABNORMAL HIGH (ref 78.0–100.0)
Platelets: 329 10*3/uL (ref 150–400)
RBC: 4.15 MIL/uL — ABNORMAL LOW (ref 4.22–5.81)
RDW: 14.5 % (ref 11.5–15.5)
WBC: 13.9 10*3/uL — ABNORMAL HIGH (ref 4.0–10.5)

## 2017-07-24 LAB — BASIC METABOLIC PANEL
Anion gap: 14 (ref 5–15)
BUN: 22 mg/dL — ABNORMAL HIGH (ref 6–20)
CO2: 22 mmol/L (ref 22–32)
Calcium: 9.4 mg/dL (ref 8.9–10.3)
Chloride: 103 mmol/L (ref 101–111)
Creatinine, Ser: 1.25 mg/dL — ABNORMAL HIGH (ref 0.61–1.24)
GFR calc Af Amer: >60 mL/min (ref >60)
GFR calc non Af Amer: 59 mL/min — ABNORMAL LOW (ref >60)
Glucose, Bld: 127 mg/dL — ABNORMAL HIGH (ref 65–99)
Potassium: 5 mmol/L (ref 3.5–5.1)
Sodium: 139 mmol/L (ref 135–145)

## 2017-07-24 MED ORDER — HYDROMORPHONE HCL 1 MG/ML IJ SOLN
1.0000 mg | Freq: Once | INTRAMUSCULAR | Status: AC
  Administered 2017-07-24: 1 mg via INTRAVENOUS
  Filled 2017-07-24: qty 1

## 2017-07-24 MED ORDER — LIDOCAINE 5 % EX OINT
TOPICAL_OINTMENT | Freq: Once | CUTANEOUS | Status: AC
  Administered 2017-07-24: 1 via TOPICAL
  Filled 2017-07-24: qty 35.44

## 2017-07-24 MED ORDER — OXYCODONE-ACETAMINOPHEN 5-325 MG PO TABS
2.0000 | ORAL_TABLET | Freq: Once | ORAL | Status: AC
  Administered 2017-07-24: 2 via ORAL
  Filled 2017-07-24: qty 2

## 2017-07-24 NOTE — Progress Notes (Signed)
CSW spoke with RN and was informed that pt needs taxi vochure to get pt back home. CSW has reached out to [place] at [facility] and she is bringing vouchure down. There are no further CSW needs. CSW will sign off.    Virgie Dad. Casanova Schurman, MSW, [place] [facility] Clinical Social Worker [phone]

## 2017-07-24 NOTE — Discharge Instructions (Addendum)
Continue with your home prescribed pain medication.  We recommend close follow-up with your primary care doctor and specialist regarding ongoing care for your chronic wounds.  Return to the emergency department, as needed, for new or concerning symptoms.

## 2017-07-24 NOTE — Progress Notes (Signed)
Cab Voucher provided. Patient was able to pay $10.00 and voucher was made out for $13.00.  No other needs identified.   Kathrin Greathouse, Latanya Presser, MSW Clinical Social Worker  [phone] 07/24/2017  9:07 AM

## 2017-11-10 ENCOUNTER — Emergency Department (HOSPITAL_COMMUNITY)
Admission: EM | Admit: 2017-11-10 | Discharge: 2017-11-10 | Discharge status: Against Medical Advice | Payer: Medicare Other | Attending: Emergency Medicine | Admitting: Emergency Medicine

## 2017-11-10 ENCOUNTER — Other Ambulatory Visit: Payer: Self-pay

## 2017-11-10 ENCOUNTER — Encounter (HOSPITAL_COMMUNITY): Payer: Self-pay | Admitting: Emergency Medicine

## 2017-11-10 DIAGNOSIS — Z5321 Procedure and treatment not carried out due to patient leaving prior to being seen by health care provider: Secondary | ICD-10-CM | POA: Insufficient documentation

## 2017-11-10 DIAGNOSIS — R42 Dizziness and giddiness: Secondary | ICD-10-CM | POA: Insufficient documentation

## 2017-11-10 DIAGNOSIS — R1031 Right lower quadrant pain: Secondary | ICD-10-CM | POA: Diagnosis present

## 2017-11-10 LAB — CBC
HCT: 44.3 % (ref 39.0–52.0)
Hemoglobin: 14.1 g/dL (ref 13.0–17.0)
MCH: 32.9 pg (ref 26.0–34.0)
MCHC: 31.8 g/dL (ref 30.0–36.0)
MCV: 103.5 fL — ABNORMAL HIGH (ref 78.0–100.0)
Platelets: 317 10*3/uL (ref 150–400)
RBC: 4.28 MIL/uL (ref 4.22–5.81)
RDW: 14.4 % (ref 11.5–15.5)
WBC: 15.3 10*3/uL — ABNORMAL HIGH (ref 4.0–10.5)

## 2017-11-10 LAB — COMPREHENSIVE METABOLIC PANEL
ALT: 33 U/L (ref 0–44)
AST: 23 U/L (ref 15–41)
Albumin: 3.9 g/dL (ref 3.5–5.0)
Alkaline Phosphatase: 50 U/L (ref 38–126)
Anion gap: 13 (ref 5–15)
BUN: 14 mg/dL (ref 8–23)
CO2: 23 mmol/L (ref 22–32)
Calcium: 9.4 mg/dL (ref 8.9–10.3)
Chloride: 100 mmol/L (ref 98–111)
Creatinine, Ser: 1.3 mg/dL — ABNORMAL HIGH (ref 0.61–1.24)
GFR calc Af Amer: >60 mL/min (ref >60)
GFR calc non Af Amer: 56 mL/min — ABNORMAL LOW (ref >60)
Glucose, Bld: 108 mg/dL — ABNORMAL HIGH (ref 70–99)
Potassium: 4.9 mmol/L (ref 3.5–5.1)
Sodium: 136 mmol/L (ref 135–145)
Total Bilirubin: 0.6 mg/dL (ref 0.3–1.2)
Total Protein: 6.6 g/dL (ref 6.5–8.1)

## 2017-11-10 LAB — URINALYSIS, ROUTINE W REFLEX MICROSCOPIC
Bilirubin Urine: NEGATIVE
Glucose, UA: NEGATIVE mg/dL
Hgb urine dipstick: NEGATIVE
Ketones, ur: NEGATIVE mg/dL
Leukocytes, UA: NEGATIVE
Nitrite: NEGATIVE
Protein, ur: NEGATIVE mg/dL
Specific Gravity, Urine: 1.01 (ref 1.005–1.030)
pH: 7 (ref 5.0–8.0)

## 2017-11-10 LAB — LIPASE, BLOOD: Lipase: 35 U/L (ref 11–51)

## 2017-11-10 NOTE — ED Triage Notes (Addendum)
Per [facility], [facility], throbbing RLQ pain x 2 days. Pt reports light-headed, increases with movement. Pt reports nausea. Denies vomiting and diarrhea. Pt reports today he developed weakness and dizziness. Pt reports eating drinking normally.

## 2018-02-20 ENCOUNTER — Emergency Department (HOSPITAL_COMMUNITY)
Admission: EM | Admit: 2018-02-20 | Discharge: 2018-02-21 | Disposition: A | Discharge status: Home / Self Care | Payer: Non-veteran care | Attending: Emergency Medicine | Admitting: Emergency Medicine

## 2018-02-20 ENCOUNTER — Encounter (HOSPITAL_COMMUNITY): Payer: Self-pay | Admitting: Emergency Medicine

## 2018-02-20 ENCOUNTER — Other Ambulatory Visit: Payer: Self-pay

## 2018-02-20 DIAGNOSIS — F17210 Nicotine dependence, cigarettes, uncomplicated: Secondary | ICD-10-CM | POA: Insufficient documentation

## 2018-02-20 DIAGNOSIS — R45851 Suicidal ideations: Secondary | ICD-10-CM | POA: Insufficient documentation

## 2018-02-20 DIAGNOSIS — F4325 Adjustment disorder with mixed disturbance of emotions and conduct: Secondary | ICD-10-CM | POA: Diagnosis present

## 2018-02-20 DIAGNOSIS — I252 Old myocardial infarction: Secondary | ICD-10-CM | POA: Insufficient documentation

## 2018-02-20 DIAGNOSIS — Z915 Personal history of self-harm: Secondary | ICD-10-CM | POA: Insufficient documentation

## 2018-02-20 DIAGNOSIS — J449 Chronic obstructive pulmonary disease, unspecified: Secondary | ICD-10-CM | POA: Insufficient documentation

## 2018-02-20 DIAGNOSIS — I10 Essential (primary) hypertension: Secondary | ICD-10-CM | POA: Insufficient documentation

## 2018-02-20 DIAGNOSIS — F332 Major depressive disorder, recurrent severe without psychotic features: Secondary | ICD-10-CM | POA: Insufficient documentation

## 2018-02-20 LAB — CBC WITH DIFFERENTIAL/PLATELET
Abs Immature Granulocytes: 0.09 10*3/uL — ABNORMAL HIGH (ref 0.00–0.07)
Basophils Absolute: 0.1 10*3/uL (ref 0.0–0.1)
Basophils Relative: 1 %
Eosinophils Absolute: 0.1 10*3/uL (ref 0.0–0.5)
Eosinophils Relative: 1 %
HCT: 45.1 % (ref 39.0–52.0)
Hemoglobin: 14.9 g/dL (ref 13.0–17.0)
Immature Granulocytes: 1 %
Lymphocytes Relative: 30 %
Lymphs Abs: 3.9 10*3/uL (ref 0.7–4.0)
MCH: 32.8 pg (ref 26.0–34.0)
MCHC: 33 g/dL (ref 30.0–36.0)
MCV: 99.3 fL (ref 80.0–100.0)
Monocytes Absolute: 1.1 10*3/uL — ABNORMAL HIGH (ref 0.1–1.0)
Monocytes Relative: 9 %
Neutro Abs: 7.7 10*3/uL (ref 1.7–7.7)
Neutrophils Relative %: 58 %
Platelets: 336 10*3/uL (ref 150–400)
RBC: 4.54 MIL/uL (ref 4.22–5.81)
RDW: 13.7 % (ref 11.5–15.5)
WBC: 13 10*3/uL — ABNORMAL HIGH (ref 4.0–10.5)
nRBC: 0 % (ref 0.0–0.2)

## 2018-02-20 LAB — COMPREHENSIVE METABOLIC PANEL
ALT: 63 U/L — ABNORMAL HIGH (ref 0–44)
AST: 37 U/L (ref 15–41)
Albumin: 4.2 g/dL (ref 3.5–5.0)
Alkaline Phosphatase: 59 U/L (ref 38–126)
Anion gap: 10 (ref 5–15)
BUN: 12 mg/dL (ref 8–23)
CO2: 22 mmol/L (ref 22–32)
Calcium: 8.6 mg/dL — ABNORMAL LOW (ref 8.9–10.3)
Chloride: 105 mmol/L (ref 98–111)
Creatinine, Ser: 1.23 mg/dL (ref 0.61–1.24)
GFR calc Af Amer: >60 mL/min (ref >60)
GFR calc non Af Amer: >60 mL/min (ref >60)
Glucose, Bld: 112 mg/dL — ABNORMAL HIGH (ref 70–99)
Potassium: 4 mmol/L (ref 3.5–5.1)
Sodium: 137 mmol/L (ref 135–145)
Total Bilirubin: 0.6 mg/dL (ref 0.3–1.2)
Total Protein: 6.9 g/dL (ref 6.5–8.1)

## 2018-02-20 LAB — RAPID URINE DRUG SCREEN, HOSP PERFORMED
Amphetamines: NOT DETECTED
Barbiturates: NOT DETECTED
Benzodiazepines: NOT DETECTED
Cocaine: NOT DETECTED
Opiates: NOT DETECTED
Tetrahydrocannabinol: NOT DETECTED

## 2018-02-20 LAB — ETHANOL: Alcohol, Ethyl (B): <10 mg/dL (ref <10)

## 2018-02-20 MED ORDER — METFORMIN HCL 500 MG PO TABS
1000.0000 mg | ORAL_TABLET | Freq: Two times a day (BID) | ORAL | Status: DC
  Administered 2018-02-21: 1000 mg via ORAL
  Filled 2018-02-20: qty 2

## 2018-02-20 MED ORDER — VITAMIN D 25 MCG (1000 UNIT) PO TABS
3000.0000 [IU] | ORAL_TABLET | Freq: Every day | ORAL | Status: DC
  Administered 2018-02-21: 3000 [IU] via ORAL
  Filled 2018-02-20: qty 3

## 2018-02-20 MED ORDER — VITAMIN D 1000 UNITS PO TABS: 3000.0000 [IU] | ORAL_TABLET | Freq: Every day | ORAL | Status: DC

## 2018-02-20 MED ORDER — BUSPIRONE HCL 10 MG PO TABS
30.0000 mg | ORAL_TABLET | Freq: Three times a day (TID) | ORAL | Status: DC
  Administered 2018-02-20 – 2018-02-21 (×2): 30 mg via ORAL
  Filled 2018-02-20 (×2): qty 3

## 2018-02-20 MED ORDER — DULOXETINE HCL 30 MG PO CPEP
60.0000 mg | ORAL_CAPSULE | Freq: Two times a day (BID) | ORAL | Status: DC
  Administered 2018-02-20 – 2018-02-21 (×2): 60 mg via ORAL
  Filled 2018-02-20 (×2): qty 2

## 2018-02-20 MED ORDER — SENNA-DOCUSATE SODIUM 8.6-50 MG PO TABS: 1.0000 | ORAL_TABLET | Freq: Every day | ORAL | Status: DC

## 2018-02-20 MED ORDER — LURASIDONE HCL 20 MG PO TABS
40.0000 mg | ORAL_TABLET | Freq: Every day | ORAL | Status: DC
  Administered 2018-02-20: 40 mg via ORAL
  Filled 2018-02-20: qty 2

## 2018-02-20 MED ORDER — TIOTROPIUM BROMIDE MONOHYDRATE 18 MCG IN CAPS: 18.0000 ug | ORAL_CAPSULE | Freq: Every day | RESPIRATORY_TRACT | Status: DC

## 2018-02-20 MED ORDER — MYCOPHENOLATE MOFETIL 250 MG PO CAPS
1500.0000 mg | ORAL_CAPSULE | Freq: Two times a day (BID) | ORAL | Status: DC
  Administered 2018-02-20 – 2018-02-21 (×2): 1500 mg via ORAL
  Filled 2018-02-20 (×2): qty 6

## 2018-02-20 MED ORDER — BUPROPION HCL ER (XL) 150 MG PO TB24
300.0000 mg | ORAL_TABLET | Freq: Every day | ORAL | Status: DC
  Administered 2018-02-21: 300 mg via ORAL
  Filled 2018-02-20: qty 2

## 2018-02-20 MED ORDER — MIRTAZAPINE 30 MG PO TABS
45.0000 mg | ORAL_TABLET | Freq: Every day | ORAL | Status: DC
  Administered 2018-02-20: 45 mg via ORAL
  Filled 2018-02-20: qty 2

## 2018-02-20 MED ORDER — SENNOSIDES-DOCUSATE SODIUM 8.6-50 MG PO TABS
1.0000 | ORAL_TABLET | Freq: Every day | ORAL | Status: DC
  Administered 2018-02-20: 1 via ORAL
  Filled 2018-02-20: qty 1

## 2018-02-20 MED ORDER — METHOTREXATE 2.5 MG PO TABS: 15.0000 mg | ORAL_TABLET | ORAL | Status: DC

## 2018-02-20 MED ORDER — LISINOPRIL 20 MG PO TABS
20.0000 mg | ORAL_TABLET | Freq: Every day | ORAL | Status: DC
  Administered 2018-02-20 – 2018-02-21 (×2): 20 mg via ORAL
  Filled 2018-02-20 (×2): qty 1

## 2018-02-20 MED ORDER — ACETAMINOPHEN 325 MG PO TABS
650.0000 mg | ORAL_TABLET | Freq: Once | ORAL | Status: AC
  Administered 2018-02-20: 650 mg via ORAL
  Filled 2018-02-20: qty 2

## 2018-02-20 MED ORDER — OXYCODONE-ACETAMINOPHEN 7.5-325 MG PO TABS
1.0000 | ORAL_TABLET | Freq: Four times a day (QID) | ORAL | Status: DC | PRN
  Administered 2018-02-21: 1 via ORAL
  Filled 2018-02-20: qty 1

## 2018-02-20 MED ORDER — ALBUTEROL SULFATE HFA 108 (90 BASE) MCG/ACT IN AERS: 2.0000 | INHALATION_SPRAY | Freq: Four times a day (QID) | RESPIRATORY_TRACT | Status: DC | PRN

## 2018-02-20 MED ORDER — ASPIRIN EC 81 MG PO TBEC
81.0000 mg | DELAYED_RELEASE_TABLET | Freq: Every day | ORAL | Status: DC
  Administered 2018-02-21: 81 mg via ORAL
  Filled 2018-02-20: qty 1

## 2018-02-20 MED ORDER — FAMOTIDINE 20 MG PO TABS
20.0000 mg | ORAL_TABLET | Freq: Every day | ORAL | Status: DC
  Administered 2018-02-20 – 2018-02-21 (×2): 20 mg via ORAL
  Filled 2018-02-20 (×2): qty 1

## 2018-02-20 MED ORDER — ADULT MULTIVITAMIN W/MINERALS CH
1.0000 | ORAL_TABLET | Freq: Every day | ORAL | Status: DC
  Administered 2018-02-20 – 2018-02-21 (×2): 1 via ORAL
  Filled 2018-02-20 (×2): qty 1

## 2018-02-20 MED ORDER — GABAPENTIN 300 MG PO CAPS
600.0000 mg | ORAL_CAPSULE | Freq: Two times a day (BID) | ORAL | Status: DC
  Administered 2018-02-20 – 2018-02-21 (×2): 600 mg via ORAL
  Filled 2018-02-20 (×2): qty 2

## 2018-02-20 NOTE — ED Triage Notes (Signed)
Patient reports having suicidal ideations for 24 hours. He has tried to commit suicide in the past. He intended to commit suicide by taking sleep medication and oxycodone. He called the police before he went through with it.

## 2018-02-20 NOTE — ED Provider Notes (Signed)
[facility] Emergency Department Provider Note MRN:  [id number]  Arrival date & time: 02/20/18     Chief Complaint   Suicidal   History of Present Illness   Mario [name] is a 68 y.o. year-old male with a history of depression, COPD, suicide attempt presenting to the ED with chief complaint of suicidal ideation.  Patient has been struggling with depression for some time.  Has lost his relationships with his family, roommate recently moved out.  Feeling very lonely, more depressed.  Daughter did not call or text him on his birthday.  Felt like the walls were closing in today, felt sick of his life.  Had a specific plan to overdose on his home medications, oxycodone specifically.  Decided to call for help instead.  Denies any other symptoms.  No AVH, no drug or alcohol use today.  Review of Systems  A complete 10 system review of systems was obtained and all systems are negative except as noted in the HPI and PMH.   Patient's Health History    Past Medical History:  Diagnosis Date  . Anxiety   . Bronchitis   . COPD (chronic obstructive pulmonary disease) ([place])   . Depression   . Hypertension   . Mental disorder   . MI (myocardial infarction) ([place])    ????  . OSA (obstructive sleep apnea)   . Suicide attempt ([place] [name])   . Tension pneumothorax 06/27/2016    Past Surgical History:  Procedure Laterality Date  . CHEST TUBE INSERTION Left 06/27/2016  . cryptorchidism    . SKIN CANCER EXCISION      Family History  Problem Relation Age of Onset  . Dementia Father     Social History   Socioeconomic History  . Marital status: Single    Spouse name: Not on file  . Number of children: Not on file  . Years of education: Not on file  . Highest education level: Not on file  Occupational History  . Not on file  Social Needs  . Financial resource strain: Not on file  . Food insecurity:    Worry: Not on file    Inability: Not on file  . Transportation needs:   Medical: Not on file    Non-medical: Not on file  Tobacco Use  . Smoking status: Current Every Day Smoker    Packs/day: 0.50    Years: 20.00    Pack years: 10.00    Types: Cigarettes  . Smokeless tobacco: Never Used  Substance and Sexual Activity  . Alcohol use: No    Alcohol/week: 0.0 standard drinks    Comment: denies use of any drugs or alcohol  . Drug use: No  . Sexual activity: Not on file  Lifestyle  . Physical activity:    Days per week: Not on file    Minutes per session: Not on file  . Stress: Not on file  Relationships  . Social connections:    Talks on phone: Not on file    Gets together: Not on file    Attends religious service: Not on file    Active member of club or organization: Not on file    Attends meetings of clubs or organizations: Not on file    Relationship status: Not on file  . Intimate partner violence:    Fear of current or ex partner: Not on file    Emotionally abused: Not on file    Physically abused: Not on file    Forced  sexual activity: Not on file  Other Topics Concern  . Not on file  Social History Narrative  . Not on file     Physical Exam  Vital Signs and Nursing Notes reviewed Vitals:   02/20/18 1728 02/20/18 1753  BP:  (!) 152/97  Pulse:  97  Resp:  18  Temp:  97.6 F (36.4 C)  SpO2: 95% 91%    CONSTITUTIONAL: Chronically ill-appearing, NAD NEURO:  Alert and oriented x 3, no focal deficits EYES:  eyes equal and reactive ENT/NECK:  no LAD, no JVD CARDIO: Regular rate, well-perfused, normal S1 and S2 PULM:  CTAB no wheezing or rhonchi GI/GU:  normal bowel sounds, non-distended, non-tender MSK/SPINE:  No gross deformities, no edema SKIN:  no rash, atraumatic PSYCH:  Appropriate speech and behavior  Diagnostic and Interventional Summary    Labs Reviewed  COMPREHENSIVE METABOLIC PANEL - Abnormal; Notable for the following components:      Result Value   Glucose, Bld 112 (*)    Calcium 8.6 (*)    ALT 63 (*)    All  other components within normal limits  CBC WITH DIFFERENTIAL/PLATELET - Abnormal; Notable for the following components:   WBC 13.0 (*)    Monocytes Absolute 1.1 (*)    Abs Immature Granulocytes 0.09 (*)    All other components within normal limits  ETHANOL  RAPID URINE DRUG SCREEN, HOSP PERFORMED    No orders to display    Medications - No data to display   Procedures Critical Care  ED Course and Medical Decision Making  I have reviewed the triage vital signs and the nursing notes.  Pertinent labs & imaging results that were available during my care of the patient were reviewed by me and considered in my medical decision making (see below for details).  Anticipating inpatient management in this 67 year old male history of depression here with specific plan for suicide.  Here voluntarily, work-up pending.  Medically cleared, TTS recommending inpatient management.  Signed out to default provider.  Barth Kirks. Sedonia Small, [place] [email or web address]  Final Clinical Impressions(s) / ED Diagnoses     ICD-10-CM   1. Suicidal ideation R45.851     ED Discharge Orders    None         Maudie Flakes, MD 02/20/18 2032

## 2018-02-20 NOTE — BH Assessment (Signed)
[facility] Assessment Progress Note   Case was staffed with [facility] NP who recommended a inpatient admission to assist with stabilization.

## 2018-02-20 NOTE — BH Assessment (Signed)
Assessment Note  Mario [name] is an 67 y.o. male with a history of depression presenting to the ED with chief complaint of suicidal ideation. Patient denies any H/I or AVH. Patient denies any current SA issues reporting he has been maintaining his sobriety for over 20 years. Patient reports one prior attempt at self harm in 2014 when he was diagnosed with depression and was hospitalized for that incident at [facility]. Patient reports he has been receiving services from the [place] since then that assists with medication management for symptom management. Patient reports current medication compliance although states he has chronic pain management issues that have worsened his symptoms of depression to include: feeling useless and excessive fatigue. Patient states he currently resides alone and has limited support. Patient states he has been struggling with depression since his health has declined and feels his medications are not working as indicated. Patient states this date he felt very overwhelmed due to "having no one" and "pain that won't go away." Patient states he has lost his relationships with his family and his roommate recently moved out. Patient states his daughter did not call or text him on his birthday. Patient had a specific plan to overdose on his home medications, oxycodone specifically. Decided to call for help instead. Per note review in 2014 patient at that time, attempted to overdose taking 30 Ambien and looked it up on the Internet and it said that was not enough to do anything, tried plastic bags on his head "but it got to hot." Patient was admitted inpatient at [facility] for that incident. Patient this date denies any other symptoms.  No AVH, no drug or alcohol use. Patient is alert and oriented and presents with pleasant affect. Case was staffed with Lavina Hamman NP who recommended a inpatient admission to assist with stabilization.    Diagnosis: F33.2 MDD recurrent without psychotic features,  severe  Past Medical History:  Past Medical History:  Diagnosis Date  . Anxiety   . Bronchitis   . COPD (chronic obstructive pulmonary disease) ([place])   . Depression   . Hypertension   . Mental disorder   . MI (myocardial infarction) ([place])    ????  . OSA (obstructive sleep apnea)   . Suicide attempt ([place])   . Tension pneumothorax 06/27/2016    Past Surgical History:  Procedure Laterality Date  . CHEST TUBE INSERTION Left 06/27/2016  . cryptorchidism    . SKIN CANCER EXCISION      Family History:  Family History  Problem Relation Age of Onset  . Dementia Father     Social History:  reports that he has been smoking cigarettes. He has a 10.00 pack-year smoking history. He has never used smokeless tobacco. He reports that he does not drink alcohol or use drugs.  Additional Social History:  Alcohol / Drug Use Pain Medications: pt denies Prescriptions: pt denies Over the Counter: pt denies History of alcohol / drug use?: No history of alcohol / drug abuse Longest period of sobriety (when/how long): NA Negative Consequences of Use: (NA) Withdrawal Symptoms: (NA)  CIWA: CIWA-Ar BP: (!) 152/97 Pulse Rate: 97 COWS:    Allergies:  Allergies  Allergen Reactions  . Demerol [Meperidine] Nausea And Vomiting    Violently sick  . Zocor [Simvastatin] Other (See Comments)    Made him very jittery, nausea and vomiting    Home Medications: (Not in a hospital admission)   OB/GYN Status:  No LMP for male patient.  General Assessment Data Location of Assessment:  [facility] ED TTS Assessment: In system Is this a Tele or Face-to-Face Assessment?: Face-to-Face Is this an Initial Assessment or a Re-assessment for this encounter?: Initial Assessment Patient Accompanied by:: (NA) Language Other than English: No Living Arrangements: (Alone) What gender do you identify as?: Male Marital status: Single Living Arrangements: Alone Can pt return to current living arrangement?:  Yes Admission Status: Voluntary Is patient capable of signing voluntary admission?: Yes Referral Source: Self/Family/Friend Insurance type: VA     Crisis Care Plan Living Arrangements: Alone Legal Guardian: (NA) Name of Psychiatrist: Provider at [facility] Name of Therapist: None  Education Status Is patient currently in school?: No Is the patient employed, unemployed or receiving disability?: Receiving disability income  Risk to self with the past 6 months Suicidal Ideation: Yes-Currently Present Has patient been a risk to self within the past 6 months prior to admission? : No Suicidal Intent: Yes-Currently Present Has patient had any suicidal intent within the past 6 months prior to admission? : No Is patient at risk for suicide?: Yes Suicidal Plan?: Yes-Currently Present Has patient had any suicidal plan within the past 6 months prior to admission? : No Specify Current Suicidal Plan: Overdose Access to Means: Yes Specify Access to Suicidal Means: Pt has medications What has been your use of drugs/alcohol within the last 12 months?: Denies current use Previous Attempts/Gestures: Yes How many times?: 1 Other Self Harm Risks: (NA) Triggers for Past Attempts: (Chronic pain) Intentional Self Injurious Behavior: None Family Suicide History: No Recent stressful life event(s): Other (Comment)(Chronic pain) Persecutory voices/beliefs?: No Depression: Yes Depression Symptoms: Isolating, Fatigue, Feeling worthless/self pity Substance abuse history and/or treatment for substance abuse?: No Suicide prevention information given to non-admitted patients: Not applicable  Risk to Others within the past 6 months Homicidal Ideation: No Does patient have any lifetime risk of violence toward others beyond the six months prior to admission? : No Thoughts of Harm to Others: No Current Homicidal Intent: No Current Homicidal Plan: No Access to Homicidal Means: No Identified Victim: NA History of  harm to others?: No Assessment of Violence: None Noted Violent Behavior Description: NA Does patient have access to weapons?: No Criminal Charges Pending?: No Does patient have a court date: No Is patient on probation?: No  Psychosis Hallucinations: None noted Delusions: None noted  Mental Status Report Appearance/Hygiene: Unremarkable Eye Contact: Good Motor Activity: Freedom of movement Speech: Logical/coherent Level of Consciousness: Alert Mood: Pleasant Affect: Appropriate to circumstance Anxiety Level: Minimal Thought Processes: Coherent, Relevant Judgement: Partial Orientation: Person, Place, Time Obsessive Compulsive Thoughts/Behaviors: None  Cognitive Functioning Concentration: Normal Memory: Recent Intact, Remote Intact Is patient IDD: No Insight: Good Impulse Control: Fair Appetite: Good Have you had any weight changes? : No Change Sleep: No Change Total Hours of Sleep: 7 Vegetative Symptoms: None  ADLScreening [facility] Assessment Services) Patient's cognitive ability adequate to safely complete daily activities?: Yes Patient able to express need for assistance with ADLs?: Yes Independently performs ADLs?: Yes (appropriate for developmental age)  Prior Inpatient Therapy Prior Inpatient Therapy: Yes Prior Therapy Dates: 2014 Prior Therapy Facilty/Provider(s): [facility] Reason for Treatment: MH issues  Prior Outpatient Therapy Prior Outpatient Therapy: Yes Prior Therapy Dates: Ongoing [facility] Reason for Treatment: Med mang Does patient have an ACCT team?: No Does patient have Intensive In-House Services?  : No Does patient have [facility] services? : No Does patient have P4CC services?: No  ADL Screening (condition at time of admission) Patient's cognitive ability adequate to safely complete daily activities?: Yes  Is the patient deaf or have difficulty hearing?: No Does the patient have difficulty seeing, even when wearing  glasses/contacts?: No Does the patient have difficulty concentrating, remembering, or making decisions?: No Patient able to express need for assistance with ADLs?: Yes Does the patient have difficulty dressing or bathing?: No Independently performs ADLs?: Yes (appropriate for developmental age) Does the patient have difficulty walking or climbing stairs?: No Weakness of Legs: None Weakness of Arms/Hands: None  Home Assistive Devices/Equipment Home Assistive Devices/Equipment: None  Therapy Consults (therapy consults require a physician order) PT Evaluation Needed: No OT Evalulation Needed: No SLP Evaluation Needed: No Abuse/Neglect Assessment (Assessment to be complete while patient is alone) Physical Abuse: Denies Verbal Abuse: Denies Sexual Abuse: Denies Exploitation of patient/patient's resources: Denies Self-Neglect: Denies Values / Beliefs Cultural Requests During Hospitalization: None Spiritual Requests During Hospitalization: None Consults Spiritual Care Consult Needed: No Social Work Consult Needed: No Regulatory affairs officer (For Healthcare) Does Patient Have a Medical Advance Directive?: No Would patient like information on creating a medical advance directive?: No - Patient declined          Disposition: Case was staffed with Starkes NP who recommended a inpatient admission to assist with stabilization.    Disposition Initial Assessment Completed for this Encounter: Yes Disposition of Patient: Admit Type of inpatient treatment program: Adult Patient refused recommended treatment: No Mode of transportation if patient is discharged/movement?: (Unk)  On Site Evaluation by:   Reviewed with Physician:    Mamie Nick 02/20/2018 6:32 PM

## 2018-02-20 NOTE — ED Notes (Signed)
Bed: FE76 Expected date:  Expected time:  Means of arrival:  Comments: EMS SI

## 2018-02-21 DIAGNOSIS — F4325 Adjustment disorder with mixed disturbance of emotions and conduct: Secondary | ICD-10-CM

## 2018-02-21 NOTE — Discharge Instructions (Signed)
For your behavioral health needs, you are advised to continue treatment at the [place]:       [facility]      [place], [place] [address]      [phone]  For supportive services for veterans in the [place] area, contact the [place]:       [facility]      [address], [place], [place] [address]      [phone]      Hours of operation: 8:00 am - 7:00 pm, Monday - Friday  For crisis services for veterans, call the [employer], available 24 hours a day, 7 days a week:       [employer]      [phone]

## 2018-02-21 NOTE — Consult Note (Addendum)
[facility] Psych ED Discharge  02/21/2018 11:01 AM Mario [name]  MRN:  [id number] Principal Problem: Adjustment disorder with mixed disturbance of emotions and conduct Discharge Diagnoses: Principal Problem:   Adjustment disorder with mixed disturbance of emotions and conduct  Subjective: 67 yo male who presented to the ED with suicidal ideations due to increase in stress.  On assessment, he denies suicidal/homicidal ideations, hallucinations, or substance abuse.  Depression increases with stress and decreases with medications and video gaming. He is presently moving and has boxed up his things but awaiting his landlord to move him as he does not drive.  Upset his landlord has not moved him yet so he can set up his computer and play games.  Basically he came to the hospital to prompt the move which worked as he is moving him tomorrow.  Patient offered a geriatric placement or a 24 hour stay as he insisted he needed to move tomorrow.  Goes to outpatient at the [facility], stable for discharge.  Total Time spent with patient: 45 minutes  Past Psychiatric History: depression  Past Medical History:  Past Medical History:  Diagnosis Date  . Anxiety   . Bronchitis   . COPD (chronic obstructive pulmonary disease) ([place])   . Depression   . Hypertension   . Mental disorder   . MI (myocardial infarction) ([place])    ????  . OSA (obstructive sleep apnea)   . Suicide attempt ([place])   . Tension pneumothorax 06/27/2016    Past Surgical History:  Procedure Laterality Date  . CHEST TUBE INSERTION Left 06/27/2016  . cryptorchidism    . SKIN CANCER EXCISION     Family History:  Family History  Problem Relation Age of Onset  . Dementia Father    Family Psychiatric  History: see above Social History:  Social History   Substance and Sexual Activity  Alcohol Use No  . Alcohol/week: 0.0 standard drinks   Comment: denies use of any drugs or alcohol     Social History   Substance and Sexual Activity  Drug  Use No    Social History   Socioeconomic History  . Marital status: Single    Spouse name: Not on file  . Number of children: Not on file  . Years of education: Not on file  . Highest education level: Not on file  Occupational History  . Not on file  Social Needs  . Financial resource strain: Not on file  . Food insecurity:    Worry: Not on file    Inability: Not on file  . Transportation needs:    Medical: Not on file    Non-medical: Not on file  Tobacco Use  . Smoking status: Current Every Day Smoker    Packs/day: 0.50    Years: 20.00    Pack years: 10.00    Types: Cigarettes  . Smokeless tobacco: Never Used  Substance and Sexual Activity  . Alcohol use: No    Alcohol/week: 0.0 standard drinks    Comment: denies use of any drugs or alcohol  . Drug use: No  . Sexual activity: Not on file  Lifestyle  . Physical activity:    Days per week: Not on file    Minutes per session: Not on file  . Stress: Not on file  Relationships  . Social connections:    Talks on phone: Not on file    Gets together: Not on file    Attends religious service: Not on file    Active  member of club or organization: Not on file    Attends meetings of clubs or organizations: Not on file    Relationship status: Not on file  Other Topics Concern  . Not on file  Social History Narrative  . Not on file    Has this patient used any form of tobacco in the last 30 days? (Cigarettes, Smokeless Tobacco, Cigars, and/or Pipes) NA  Current Medications: Current Facility-Administered Medications  Medication Dose Route Frequency Provider Last Rate Last Dose  . albuterol (PROVENTIL HFA;VENTOLIN HFA) 108 (90 Base) MCG/ACT inhaler 2 puff  2 puff Inhalation Q6H PRN Maudie Flakes, MD      . aspirin EC tablet 81 mg  81 mg Oral Daily Maudie Flakes, MD   81 mg at 02/21/18 1058  . buPROPion (WELLBUTRIN XL) 24 hr tablet 300 mg  300 mg Oral QPC breakfast Maudie Flakes, MD   300 mg at 02/21/18 9147  .  busPIRone (BUSPAR) tablet 30 mg  30 mg Oral TID Maudie Flakes, MD   30 mg at 02/21/18 1058  . cholecalciferol (VITAMIN D3) tablet 3,000 Units  3,000 Units Oral Daily Maudie Flakes, MD   3,000 Units at 02/21/18 1100  . DULoxetine (CYMBALTA) DR capsule 60 mg  60 mg Oral BID Maudie Flakes, MD   60 mg at 02/21/18 1100  . famotidine (PEPCID) tablet 20 mg  20 mg Oral Daily Maudie Flakes, MD   20 mg at 02/21/18 1058  . gabapentin (NEURONTIN) capsule 600 mg  600 mg Oral BID Maudie Flakes, MD   600 mg at 02/20/18 2143  . lisinopril (PRINIVIL,ZESTRIL) tablet 20 mg  20 mg Oral Daily Maudie Flakes, MD   20 mg at 02/21/18 1059  . lurasidone (LATUDA) tablet 40 mg  40 mg Oral QHS Maudie Flakes, MD   40 mg at 02/20/18 2146  . metFORMIN (GLUCOPHAGE) tablet 1,000 mg  1,000 mg Oral BID WC Maudie Flakes, MD   1,000 mg at 02/21/18 8295  . [START ON 02/24/2018] methotrexate (RHEUMATREX) tablet 15 mg  15 mg Oral Q Sat Maudie Flakes, MD      . mirtazapine (REMERON) tablet 45 mg  45 mg Oral QHS Maudie Flakes, MD   45 mg at 02/20/18 2143  . multivitamin with minerals tablet 1 tablet  1 tablet Oral Daily Maudie Flakes, MD   1 tablet at 02/21/18 1100  . mycophenolate (CELLCEPT) capsule 1,500 mg  1,500 mg Oral BID Maudie Flakes, MD   1,500 mg at 02/20/18 2145  . senna-docusate (Senokot-S) tablet 1 tablet  1 tablet Oral QHS Maudie Flakes, MD   1 tablet at 02/20/18 2146   Current Outpatient Medications  Medication Sig Dispense Refill  . albuterol (PROAIR HFA) 108 (90 Base) MCG/ACT inhaler Inhale 2 puffs into the lungs every 6 (six) hours as needed for wheezing or shortness of breath.    [name] aspirin EC 81 MG tablet Take 81 mg by mouth daily.    [name] buPROPion (WELLBUTRIN XL) 300 MG 24 hr tablet Take 300 mg by mouth daily after breakfast.     . busPIRone (BUSPAR) 30 MG tablet Take 30 mg by mouth 3 (three) times daily.    . cholecalciferol (VITAMIN D) 1000 units tablet Take 3,000 Units by mouth daily.    .  DULoxetine (CYMBALTA) 60 MG capsule Take 1 capsule (60 mg total) by mouth 2 (two) times daily. 60 capsule 0  .  Etanercept (ENBREL K[place]) Inject into the skin See admin instructions. Every Wednesday    . folic acid (FOLVITE) 1 MG tablet Take 1 mg by mouth daily.    [name] gabapentin (NEURONTIN) 300 MG capsule Take 2 capsules (600 mg total) by mouth 2 (two) times daily. (Patient taking differently: Take 900 mg by mouth 3 (three) times daily. ) 60 capsule 1  . Hypromellose (ARTIFICIAL TEARS OP) Place 1 drop into both eyes daily as needed (dry eyes).    . Ipratropium-Albuterol (COMBIVENT RESPIMAT) 20-100 MCG/ACT AERS respimat Inhale 1-2 puffs into the lungs every 6 (six) hours as needed for wheezing.     . lidocaine (XYLOCAINE) 5 % ointment Apply topically as needed (at dressing changes). 35.44 g 0  . lisinopril (PRINIVIL,ZESTRIL) 20 MG tablet Take 1 tablet (20 mg total) by mouth daily. For high blood pressure (Patient taking differently: Take 20 mg by mouth daily. ) 30 tablet 0  . lurasidone (LATUDA) 20 MG TABS tablet Take 40 mg by mouth at bedtime.     . metFORMIN (GLUCOPHAGE) 1000 MG tablet Take 1,000 mg by mouth 2 (two) times daily with a meal.    . methotrexate (RHEUMATREX) 2.5 MG tablet Take 6 tablets (15 mg total) by mouth every Saturday. Hold until seen by rheumatology  Caution:Chemotherapy. Protect from light. 4 tablet 0  . mirtazapine (REMERON) 15 MG tablet Take 45 mg by mouth at bedtime.     . Multiple Vitamin (MULTIVITAMIN WITH MINERALS) TABS tablet Take 1 tablet by mouth daily.    . mycophenolate (CELLCEPT) 250 MG capsule Take 1,500 mg by mouth 2 (two) times daily.    [name] oxyCODONE-acetaminophen (PERCOCET) 7.5-325 MG tablet Take 1 tablet by mouth every 6 (six) hours as needed for severe pain. 20 tablet 0  . PRESCRIPTION MEDICATION Take 1 application by mouth daily as needed (foot itching). Anti-fungal cream with moisturizer from [place]    . ranitidine (ZANTAC) 150 MG tablet Take 150 mg by mouth 2 (two)  times daily.    . sennosides-docusate sodium (SENOKOT-S) 8.6-50 MG tablet Take 1 tablet by mouth daily.     . sodium chloride (OCEAN) 0.65 % SOLN nasal spray Place 2 sprays into both nostrils 4 (four) times daily as needed for congestion.    . traMADol (ULTRAM) 50 MG tablet Take 100 mg by mouth every 8 (eight) hours as needed for moderate pain or severe pain.    . vitamin C (ASCORBIC ACID) 500 MG tablet Take 500 mg by mouth daily.    [name] oxyCODONE 10 MG TABS Take 10 mg by mouth every 4-6 hours PRN severe pain. (Patient not taking: Reported on 07/24/2017) 28 tablet 0  . PRESCRIPTION MEDICATION Inhale into the lungs at bedtime. CPAP    . silver sulfADIAZINE (SILVADENE) 1 % cream Apply topically daily. (Patient not taking: Reported on 02/20/2018) 400 g 0  . tiotropium (SPIRIVA) 18 MCG inhalation capsule Place 1 capsule (18 mcg total) into inhaler and inhale daily. (Patient not taking: Reported on 02/20/2018) 30 capsule 12   PTA Medications: (Not in a hospital admission)   Musculoskeletal: Strength & Muscle Tone: within normal limits Gait & Station: normal Patient leans: N/A  Psychiatric Specialty Exam: Physical Exam  Nursing note and vitals reviewed. Constitutional: He is oriented to person, place, and time. He appears well-developed and well-nourished.  HENT:  Head: Normocephalic.  Neck: Normal range of motion.  Respiratory: Effort normal.  Musculoskeletal: Normal range of motion.  Neurological: He is alert and oriented to  person, place, and time.  Psychiatric: His speech is normal and behavior is normal. Judgment and thought content normal. His mood appears anxious. Cognition and memory are normal.    Review of Systems  Psychiatric/Behavioral: The patient is nervous/anxious.   All other systems reviewed and are negative.   Blood pressure (!) 154/97, pulse 98, temperature (!) 97.4 F (36.3 C), temperature source Oral, resp. rate 18, height 6\' 2"  (1.88 m), weight (!) 149.7 kg, SpO2 92  %.Body mass index is 42.37 kg/m.  General Appearance: Casual  Eye Contact:  Good  Speech:  Normal Rate  Volume:  Normal  Mood:  Anxious, mild  Affect:  Congruent  Thought Process:  Coherent and Descriptions of Associations: Intact  Orientation:  Full (Time, Place, and Person)  Thought Content:  WDL and Logical  Suicidal Thoughts:  No  Homicidal Thoughts:  No  Memory:  Immediate;   Good Recent;   Good Remote;   Good  Judgement:  Fair  Insight:  Good  Psychomotor Activity:  Normal  Concentration:  Concentration: Good and Attention Span: Good  Recall:  Good  Fund of Knowledge:  Good  Language:  Good  Akathisia:  No  Handed:  Right  AIMS (if indicated):   N/A  Assets:  Housing Leisure Time Resilience Social Support  ADL's:  Intact  Cognition:  WNL  Sleep:   N/A     Demographic Factors:  Male and Caucasian  Loss Factors: NA  Historical Factors: NA  Risk Reduction Factors:   Sense of responsibility to family, Positive social support and Positive therapeutic relationship  Continued Clinical Symptoms:  Anxiety, mild  Cognitive Features That Contribute To Risk:  None    Suicide Risk:  Minimal: No identifiable suicidal ideation.  Patients presenting with no risk factors but with morbid ruminations; may be classified as minimal risk based on the severity of the depressive symptoms    Plan Of Care/Follow-up recommendations:  Adjustment disorder with mixed disturbance of emotions and conduct: -Continue Wellbutrin 300 mg daily for depression -Continue Cymbalta 60 mg BID for depression -Continue Latuda 40 mg daily for mood stabilization  Anxiety: -Continue Buspar 30 mg TID for anxiety  Insomnia  -Continue Remeron 45 mg at bedtime  Activity:  as tolerated Diet:  heart healthy diet  Disposition: discharge home Waylan Boga, NP 02/21/2018, 11:01 AM   Patient seen face-to-face for psychiatric evaluation, chart reviewed and case discussed with the physician  extender and developed treatment plan. Reviewed the information documented and agree with the treatment plan.  Buford Dresser, DO 02/21/18 1:39 PM

## 2018-02-21 NOTE — BH Assessment (Signed)
[facility] Assessment Progress Note  Per Buford Dresser, DO, this pt does not require psychiatric hospitalization at this time.  Pt is to be discharged from [facility] with recommendation to continue treatment at the [facility].  This has been included in pt's discharge instructions, along with other information regarding supportive services for veterans.  Pt's nurse has been notified.  Jalene Mullet, [place] Triage Specialist [phone]

## 2018-04-30 ENCOUNTER — Emergency Department (HOSPITAL_COMMUNITY)
Admission: EM | Admit: 2018-04-30 | Discharge: 2018-05-01 | Disposition: A | Discharge status: Rehabilitation Facility | Payer: Non-veteran care | Attending: Emergency Medicine | Admitting: Emergency Medicine

## 2018-04-30 ENCOUNTER — Other Ambulatory Visit: Payer: Self-pay

## 2018-04-30 DIAGNOSIS — F17210 Nicotine dependence, cigarettes, uncomplicated: Secondary | ICD-10-CM | POA: Insufficient documentation

## 2018-04-30 DIAGNOSIS — F32A Depression, unspecified: Secondary | ICD-10-CM

## 2018-04-30 DIAGNOSIS — F329 Major depressive disorder, single episode, unspecified: Secondary | ICD-10-CM

## 2018-04-30 DIAGNOSIS — I252 Old myocardial infarction: Secondary | ICD-10-CM | POA: Insufficient documentation

## 2018-04-30 DIAGNOSIS — I10 Essential (primary) hypertension: Secondary | ICD-10-CM | POA: Insufficient documentation

## 2018-04-30 DIAGNOSIS — Z79899 Other long term (current) drug therapy: Secondary | ICD-10-CM | POA: Insufficient documentation

## 2018-04-30 DIAGNOSIS — Z85828 Personal history of other malignant neoplasm of skin: Secondary | ICD-10-CM | POA: Insufficient documentation

## 2018-04-30 DIAGNOSIS — E119 Type 2 diabetes mellitus without complications: Secondary | ICD-10-CM | POA: Insufficient documentation

## 2018-04-30 DIAGNOSIS — F332 Major depressive disorder, recurrent severe without psychotic features: Secondary | ICD-10-CM

## 2018-04-30 DIAGNOSIS — J449 Chronic obstructive pulmonary disease, unspecified: Secondary | ICD-10-CM | POA: Insufficient documentation

## 2018-04-30 DIAGNOSIS — Z7984 Long term (current) use of oral hypoglycemic drugs: Secondary | ICD-10-CM | POA: Insufficient documentation

## 2018-04-30 DIAGNOSIS — Z7982 Long term (current) use of aspirin: Secondary | ICD-10-CM | POA: Insufficient documentation

## 2018-04-30 DIAGNOSIS — R45851 Suicidal ideations: Secondary | ICD-10-CM | POA: Insufficient documentation

## 2018-04-30 LAB — COMPREHENSIVE METABOLIC PANEL
ALT: 26 U/L (ref 0–44)
AST: 21 U/L (ref 15–41)
Albumin: 4.3 g/dL (ref 3.5–5.0)
Alkaline Phosphatase: 81 U/L (ref 38–126)
Anion gap: 11 (ref 5–15)
BUN: 15 mg/dL (ref 8–23)
CO2: 20 mmol/L — ABNORMAL LOW (ref 22–32)
Calcium: 9.2 mg/dL (ref 8.9–10.3)
Chloride: 105 mmol/L (ref 98–111)
Creatinine, Ser: 1.12 mg/dL (ref 0.61–1.24)
GFR calc Af Amer: >60 mL/min (ref >60)
GFR calc non Af Amer: >60 mL/min (ref >60)
Glucose, Bld: 100 mg/dL — ABNORMAL HIGH (ref 70–99)
Potassium: 3.9 mmol/L (ref 3.5–5.1)
Sodium: 136 mmol/L (ref 135–145)
Total Bilirubin: 0.3 mg/dL (ref 0.3–1.2)
Total Protein: 7.6 g/dL (ref 6.5–8.1)

## 2018-04-30 LAB — RAPID URINE DRUG SCREEN, HOSP PERFORMED
Amphetamines: NOT DETECTED
Barbiturates: NOT DETECTED
Benzodiazepines: NOT DETECTED
Cocaine: NOT DETECTED
Opiates: NOT DETECTED
Tetrahydrocannabinol: NOT DETECTED

## 2018-04-30 LAB — CBC WITH DIFFERENTIAL/PLATELET
Abs Immature Granulocytes: 0.09 10*3/uL — ABNORMAL HIGH (ref 0.00–0.07)
Basophils Absolute: 0.1 10*3/uL (ref 0.0–0.1)
Basophils Relative: 1 %
Eosinophils Absolute: 0.1 10*3/uL (ref 0.0–0.5)
Eosinophils Relative: 1 %
HCT: 50.1 % (ref 39.0–52.0)
Hemoglobin: 16.4 g/dL (ref 13.0–17.0)
Immature Granulocytes: 1 %
Lymphocytes Relative: 22 %
Lymphs Abs: 3.2 10*3/uL (ref 0.7–4.0)
MCH: 32.3 pg (ref 26.0–34.0)
MCHC: 32.7 g/dL (ref 30.0–36.0)
MCV: 98.8 fL (ref 80.0–100.0)
Monocytes Absolute: 1.3 10*3/uL — ABNORMAL HIGH (ref 0.1–1.0)
Monocytes Relative: 9 %
Neutro Abs: 9.6 10*3/uL — ABNORMAL HIGH (ref 1.7–7.7)
Neutrophils Relative %: 66 %
Platelets: 352 10*3/uL (ref 150–400)
RBC: 5.07 MIL/uL (ref 4.22–5.81)
RDW: 14 % (ref 11.5–15.5)
WBC: 14.3 10*3/uL — ABNORMAL HIGH (ref 4.0–10.5)
nRBC: 0 % (ref 0.0–0.2)

## 2018-04-30 LAB — SALICYLATE LEVEL: Salicylate Lvl: <7 mg/dL (ref 2.8–30.0)

## 2018-04-30 LAB — ACETAMINOPHEN LEVEL: Acetaminophen (Tylenol), Serum: <10 ug/mL — ABNORMAL LOW (ref 10–30)

## 2018-04-30 LAB — ETHANOL: Alcohol, Ethyl (B): <10 mg/dL (ref <10)

## 2018-04-30 MED ORDER — TRAMADOL HCL 50 MG PO TABS
100.0000 mg | ORAL_TABLET | Freq: Three times a day (TID) | ORAL | Status: DC | PRN
  Administered 2018-04-30 – 2018-05-01 (×2): 100 mg via ORAL
  Filled 2018-04-30 (×2): qty 2

## 2018-04-30 MED ORDER — MYCOPHENOLATE MOFETIL 250 MG PO CAPS
1500.0000 mg | ORAL_CAPSULE | Freq: Two times a day (BID) | ORAL | Status: DC
  Administered 2018-04-30 – 2018-05-01 (×2): 1500 mg via ORAL
  Filled 2018-04-30 (×2): qty 6

## 2018-04-30 MED ORDER — FOLIC ACID 1 MG PO TABS
1.0000 mg | ORAL_TABLET | Freq: Every day | ORAL | Status: DC
  Administered 2018-05-01: 1 mg via ORAL
  Filled 2018-04-30 (×2): qty 1

## 2018-04-30 MED ORDER — METFORMIN HCL 500 MG PO TABS
1000.0000 mg | ORAL_TABLET | Freq: Two times a day (BID) | ORAL | Status: DC
  Filled 2018-04-30 (×2): qty 2

## 2018-04-30 MED ORDER — LISINOPRIL 20 MG PO TABS
20.0000 mg | ORAL_TABLET | Freq: Every day | ORAL | Status: DC
  Administered 2018-05-01: 20 mg via ORAL
  Filled 2018-04-30 (×2): qty 1

## 2018-04-30 MED ORDER — GABAPENTIN 300 MG PO CAPS
900.0000 mg | ORAL_CAPSULE | Freq: Three times a day (TID) | ORAL | Status: DC
  Administered 2018-04-30 – 2018-05-01 (×2): 900 mg via ORAL
  Filled 2018-04-30 (×2): qty 3

## 2018-04-30 MED ORDER — ACETAMINOPHEN 325 MG PO TABS
650.0000 mg | ORAL_TABLET | Freq: Once | ORAL | Status: DC
  Filled 2018-04-30: qty 2

## 2018-04-30 NOTE — ED Notes (Signed)
Bed: WA27 Expected date:  Expected time:  Means of arrival:  Comments: [place] b

## 2018-04-30 NOTE — BH Assessment (Signed)
[facility] Assessment Progress Note Case was staffed with Lord DNP who recommended a inpatient admission to assist with stabilization.       

## 2018-04-30 NOTE — ED Provider Notes (Signed)
[facility] DEPT Provider Note   CSN: [id number] Arrival date & time: 04/30/18  1449    History   Chief Complaint Chief Complaint  Patient presents with  . Suicidal    HPI Mario [name] is a 68 y.o. male with a past medical history of hypertension, COPD, anxiety, depression who presents to ED for worsening feelings of depression and suicidal ideation.  He states that he is no longer in touch with his family and does not enjoy living with his roommate.  He has been having issues with falling asleep at night.  He has a specific plan of wanting to overdose on his oxycodone 15 mg.  He did have a prior suicide attempt in 2015.  He denies any HI, AVH.  He denies alcohol or other drug use.  Reports tobacco use.     HPI  Past Medical History:  Diagnosis Date  . Anxiety   . Bronchitis   . COPD (chronic obstructive pulmonary disease) ([place])   . Depression   . Hypertension   . Mental disorder   . MI (myocardial infarction) ([place])    ????  . OSA (obstructive sleep apnea)   . Suicide attempt ([place])   . Tension pneumothorax 06/27/2016    Patient Active Problem List   Diagnosis Date Noted  . 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 Cellulitis of both feet 07/16/2017  . DM2 (diabetes mellitus, type 2) ([place]) 07/16/2017  . HLA B27 (HLA B27 positive) 07/16/2017  . Acute chest pain   . Hyperkalemia   . Spontaneous pneumothorax 06/27/2016  . Hypoxia   . Pneumothorax on left   . Dyspnea 05/23/2014  . COPD exacerbation ([place]) 05/23/2014  . Malingering 01/21/2013  . Depression 01/11/2013  . Tobacco abuse 01/11/2013  . Obesity, unspecified 01/11/2013  . Chest pain 01/10/2013  . HTN (hypertension) 01/10/2013    Past Surgical History:  Procedure Laterality Date  . CHEST TUBE INSERTION Left 06/27/2016  . cryptorchidism    . SKIN CANCER EXCISION          Home Medications    Prior to Admission medications     Medication Sig Start Date End Date Taking? Authorizing Provider  aspirin EC 81 MG tablet Take 81 mg by mouth daily.   Yes [provider]  buPROPion (WELLBUTRIN XL) 300 MG 24 hr tablet Take 300 mg by mouth daily after breakfast.    Yes [provider]  busPIRone (BUSPAR) 30 MG tablet Take 30 mg by mouth 3 (three) times daily.   Yes [provider]  cholecalciferol (VITAMIN D) 1000 units tablet Take 3,000 Units by mouth daily.   Yes [provider]  DULoxetine (CYMBALTA) 60 MG capsule Take 1 capsule (60 mg total) by mouth 2 (two) times daily. 07/21/17  Yes Lavina Hamman, MD  folic acid (FOLVITE) 1 MG tablet Take 1 mg by mouth daily.   Yes [provider]  gabapentin (NEURONTIN) 300 MG capsule Take 2 capsules (600 mg total) by mouth 2 (two) times daily. Patient taking differently: Take 900 mg by mouth 3 (three) times daily.  07/06/16  Yes Tacy Dura, Donielle M, PA-C  Hypromellose (ARTIFICIAL TEARS OP) Place 1 drop into both eyes daily as needed (dry eyes).   Yes [provider]  lisinopril (PRINIVIL,ZESTRIL) 20 MG tablet Take 1 tablet (20 mg total) by mouth daily. For high blood pressure Patient taking differently: Take 20 mg by mouth daily.  01/21/13  Yes  Nena Polio T, PA-C  lurasidone (LATUDA) 20 MG TABS tablet Take 40 mg by mouth at bedtime.    Yes [provider]  metFORMIN (GLUCOPHAGE) 1000 MG tablet Take 1,000 mg by mouth 2 (two) times daily with a meal.   Yes [provider]  methotrexate (RHEUMATREX) 2.5 MG tablet Take 6 tablets (15 mg total) by mouth every Saturday. Hold until seen by rheumatology  Caution:Chemotherapy. Protect from light. 07/22/17  Yes Lavina Hamman, MD  Multiple Vitamin (MULTIVITAMIN WITH MINERALS) TABS tablet Take 1 tablet by mouth daily.   Yes [provider]  mycophenolate (CELLCEPT) 250 MG capsule Take 1,500 mg by mouth 2 (two) times daily.   Yes [provider]  Olodaterol  HCl 2.5 MCG/ACT AERS Inhale 2 puffs into the lungs every morning.   Yes [provider]  sennosides-docusate sodium (SENOKOT-S) 8.6-50 MG tablet Take 1 tablet by mouth daily.    Yes [provider]  sodium chloride (OCEAN) 0.65 % SOLN nasal spray Place 2 sprays into both nostrils 4 (four) times daily as needed for congestion.   Yes [provider]  traMADol (ULTRAM) 50 MG tablet Take 100 mg by mouth every 8 (eight) hours as needed for moderate pain or severe pain.   Yes [provider]  vitamin C (ASCORBIC ACID) 500 MG tablet Take 500 mg by mouth daily.   Yes [provider]  Etanercept (ENBREL [place]) Inject into the skin See admin instructions. Every Wednesday    [provider]  lidocaine (XYLOCAINE) 5 % ointment Apply topically as needed (at dressing changes). Patient not taking: Reported on 04/30/2018 07/21/17   Lavina Hamman, MD  silver sulfADIAZINE (SILVADENE) 1 % cream Apply topically daily. Patient not taking: Reported on 02/20/2018 07/21/17   Lavina Hamman, MD  tiotropium [facility]) 18 MCG inhalation capsule Place 1 capsule (18 mcg total) into inhaler and inhale daily. Patient not taking: Reported on 02/20/2018 05/26/14   Cristal Ford, DO    Family History Family History  Problem Relation Age of Onset  . Dementia Father     Social History Social History   Tobacco Use  . Smoking status: Current Every Day Smoker    Packs/day: 0.50    Years: 20.00    Pack years: 10.00    Types: Cigarettes  . Smokeless tobacco: Never Used  Substance Use Topics  . Alcohol use: No    Alcohol/week: 0.0 standard drinks    Comment: denies use of any drugs or alcohol  . Drug use: No     Allergies   Demerol [meperidine] and Zocor [simvastatin]   Review of Systems Review of Systems  Constitutional: Negative for appetite change, chills and fever.  HENT: Negative for ear pain, rhinorrhea, sneezing and sore throat.   Eyes: Negative for  photophobia and visual disturbance.  Respiratory: Negative for cough, chest tightness, shortness of breath and wheezing.   Cardiovascular: Negative for chest pain and palpitations.  Gastrointestinal: Negative for abdominal pain, blood in stool, constipation, diarrhea, nausea and vomiting.  Genitourinary: Negative for dysuria, hematuria and urgency.  Musculoskeletal: Negative for myalgias.  Skin: Negative for rash.  Neurological: Negative for dizziness, weakness and light-headedness.  Psychiatric/Behavioral: Positive for suicidal ideas.     Physical Exam Updated Vital Signs BP 120/86 (BP Location: Right Arm)   Pulse 90   Temp 97.6 F (36.4 C) (Oral)   Resp 16   Ht '6\' 2"'  (1.88 m)   Wt (!) 149.7 kg   SpO2  94%   BMI 42.37 kg/m   Physical Exam Vitals signs and nursing note reviewed.  Constitutional:      General: He is not in acute distress.    Appearance: He is well-developed.  HENT:     Head: Normocephalic and atraumatic.     Nose: Nose normal.  Eyes:     General: No scleral icterus.       Left eye: No discharge.     Conjunctiva/sclera: Conjunctivae normal.  Neck:     Musculoskeletal: Normal range of motion and neck supple.  Cardiovascular:     Rate and Rhythm: Normal rate and regular rhythm.     Heart sounds: Normal heart sounds. No murmur. No friction rub. No gallop.   Pulmonary:     Effort: Pulmonary effort is normal. No respiratory distress.     Breath sounds: Normal breath sounds.  Abdominal:     General: Bowel sounds are normal. There is no distension.     Palpations: Abdomen is soft.     Tenderness: There is no abdominal tenderness. There is no guarding.  Musculoskeletal: Normal range of motion.  Skin:    General: Skin is warm and dry.     Findings: No rash.  Neurological:     Mental Status: He is alert.     Motor: No abnormal muscle tone.     Coordination: Coordination normal.      ED Treatments / Results  Labs (all labs ordered are listed, but only  abnormal results are displayed) Labs Reviewed  COMPREHENSIVE METABOLIC PANEL - Abnormal; Notable for the following components:      Result Value   CO2 20 (*)    Glucose, Bld 100 (*)    All other components within normal limits  CBC WITH DIFFERENTIAL/PLATELET - Abnormal; Notable for the following components:   WBC 14.3 (*)    Neutro Abs 9.6 (*)    Monocytes Absolute 1.3 (*)    Abs Immature Granulocytes 0.09 (*)    All other components within normal limits  ACETAMINOPHEN LEVEL - Abnormal; Notable for the following components:   Acetaminophen (Tylenol), Serum <10 (*)    All other components within normal limits  ETHANOL  RAPID URINE DRUG SCREEN, HOSP PERFORMED  SALICYLATE LEVEL    EKG None  Radiology No results found.  Procedures Procedures (including critical care time)  Medications Ordered in ED Medications  acetaminophen (TYLENOL) tablet 650 mg (has no administration in time range)  folic acid (FOLVITE) tablet 1 mg (has no administration in time range)  gabapentin (NEURONTIN) capsule 900 mg (has no administration in time range)  lisinopril (PRINIVIL,ZESTRIL) tablet 20 mg (has no administration in time range)  mycophenolate (CELLCEPT) capsule 1,500 mg (has no administration in time range)  metFORMIN (GLUCOPHAGE) tablet 1,000 mg (has no administration in time range)  traMADol (ULTRAM) tablet 100 mg (has no administration in time range)     Initial Impression / Assessment and Plan / ED Course  I have reviewed the triage vital signs and the nursing notes.  Pertinent labs & imaging results that were available during my care of the patient were reviewed by me and considered in my medical decision making (see chart for details).        68 year old male with a past medical history of anxiety, depression presents to ED for suicidal ideations.  His plan is to overdose on his oxycodone.  Lab work with leukocytosis of 14, which appears chronic for him.  Ethanol, UDS,  salicylate, acetaminophen level  and CMP are unremarkable.  EKG shows normal sinus rhythm.  Patient is medically cleared for TTS evaluation and disposition.  TTS recommends inpatient treatment.  Final Clinical Impressions(s) / ED Diagnoses   Final diagnoses:  Suicidal ideation  Depression, unspecified depression type    ED Discharge Orders    None       Delia Heady, PA-C 04/30/18 2039    Drenda Freeze, MD 05/01/18 1128

## 2018-04-30 NOTE — Progress Notes (Signed)
CSW spoke with AOD at the [facility] and assessed that there are no psychiatric beds available to this time.  AC Maudie Mercury has been notified.   Audree Camel, LCSW, [place] Disposition [facility]/TTS [phone]

## 2018-04-30 NOTE — Progress Notes (Signed)
Pt meets inpatient criteria per Waylan Boga, NP. Referral information has been sent to the following hospitals for review:  [facility]   Disposition will continue to assist with inpatient placement needs.   Audree Camel, LCSW, [place] Disposition [facility]/TTS [phone]

## 2018-04-30 NOTE — BH Assessment (Addendum)
Assessment Note  Mario [name] is an 68 y.o. male that presents this date voluntary with S/I. Patient reports a plan to overdose on "old pain medications." Patient denies any H/I or AVH. Patient per notes was last seen on 02/20/18 when he presented with S/I. Patient was recommended for a inpatient admission at that time. Patient at that time also reported a plan to overdose on medications. Patient has a noted history of depression who presents to ED for worsening feelings of depression and suicidal ideation. He states that he is no longer in touch with his family and does not enjoy living with his roommate. Patient could not provide any contact information in reference to obtaining any collateral information. Patient states he hasn't been able to sleep "in weeks" reporting 2 hours or less a night for the past month. Patient states he currently receives services from the [facility] [place] in [place] that assists with medication management. Patient states he has a specific plan of wanting to overdose on his oxycodone 15 mg that he has been "hiding for months." Patient denies any current SA issues reporting he has been maintaining his sobriety for over 20 years. UDS pending this date. Patient reports one prior attempt at self harm in 2014 when he was diagnosed with depression and was hospitalized for that incident at [facility]. Patient reports he has been receiving services from the [facility] since then that assists with medication management for symptoms of depression. Patient reports current medication compliance although states "they don't work anymore." Patient reports ongoing symptoms to include: hopelessness and excessive fatigue. Patient states he currently resides with a roommate that he "just can't stand." Patient states he has lost his relationships with his family and just "wants to end it all." Patient stated "after some thinking" this date he decided to contact 911 who transported him here to "get help." Patient this date  denies any other symptoms. Patient is alert and oriented and presents with pleasant affect. Case was staffed with Reita Cliche DNP who recommended a inpatient admission to assist with stabilization.     Diagnosis: F33.2 MDD recurrent without psychotic features, severe   Past Medical History:  Past Medical History:  Diagnosis Date  . Anxiety   . Bronchitis   . COPD (chronic obstructive pulmonary disease) ([place])   . Depression   . Hypertension   . Mental disorder   . MI (myocardial infarction) ([place])    ????  . OSA (obstructive sleep apnea)   . Suicide attempt ([place])   . Tension pneumothorax 06/27/2016    Past Surgical History:  Procedure Laterality Date  . CHEST TUBE INSERTION Left 06/27/2016  . cryptorchidism    . SKIN CANCER EXCISION      Family History:  Family History  Problem Relation Age of Onset  . Dementia Father     Social History:  reports that he has been smoking cigarettes. He has a 10.00 pack-year smoking history. He has never used smokeless tobacco. He reports that he does not drink alcohol or use drugs.  Additional Social History:  Alcohol / Drug Use Pain Medications: pt denies Prescriptions: pt denies Over the Counter: pt denies History of alcohol / drug use?: No history of alcohol / drug abuse Longest period of sobriety (when/how long): NA  CIWA: CIWA-Ar BP: (!) 144/92 COWS:    Allergies:  Allergies  Allergen Reactions  . Demerol [Meperidine] Nausea And Vomiting    Violently sick  . Zocor [Simvastatin] Other (See Comments)    Made him very  jittery, nausea and vomiting    Home Medications: (Not in a hospital admission)   OB/GYN Status:  No LMP for male patient.  General Assessment Data Location of Assessment: [facility] TTS Assessment: In system Is this a Tele or Face-to-Face Assessment?: Face-to-Face Is this an Initial Assessment or a Re-assessment for this encounter?: Initial Assessment Patient Accompanied by:: (NA) Language Other than English:  No Living Arrangements: Other (Comment)(Roommate) What gender do you identify as?: Male Marital status: Single Living Arrangements: Non-relatives/Friends Can pt return to current living arrangement?: Yes Admission Status: Voluntary Is patient capable of signing voluntary admission?: Yes Referral Source: Self/Family/Friend Insurance type: VA     Crisis Care Plan Living Arrangements: Non-relatives/Friends Legal Guardian: (NA) Name of Psychiatrist: [place]  Name of Therapist: None  Education Status Is patient currently in school?: No Is the patient employed, unemployed or receiving disability?: Unemployed  Risk to self with the past 6 months Suicidal Ideation: Yes-Currently Present Has patient been a risk to self within the past 6 months prior to admission? : No Suicidal Intent: Yes-Currently Present Has patient had any suicidal intent within the past 6 months prior to admission? : No Is patient at risk for suicide?: Yes Suicidal Plan?: Yes-Currently Present Has patient had any suicidal plan within the past 6 months prior to admission? : No Specify Current Suicidal Plan: Overdose on medications Access to Means: Yes Specify Access to Suicidal Means: Pt states they have "old pain medications"  What has been your use of drugs/alcohol within the last 12 months?: Denies Previous Attempts/Gestures: Yes How many times?: 1 Other Self Harm Risks: NA Triggers for Past Attempts: Unknown Intentional Self Injurious Behavior: None Family Suicide History: No Recent stressful life event(s): Other (Comment)(Increased MH symptoms) Persecutory voices/beliefs?: No Depression: Yes Depression Symptoms: Isolating, Feeling worthless/self pity Substance abuse history and/or treatment for substance abuse?: No Suicide prevention information given to non-admitted patients: Not applicable  Risk to Others within the past 6 months Homicidal Ideation: No Does patient have any lifetime risk of  violence toward others beyond the six months prior to admission? : No Thoughts of Harm to Others: No Current Homicidal Intent: No Current Homicidal Plan: No Access to Homicidal Means: No Identified Victim: NA History of harm to others?: No Assessment of Violence: None Noted Violent Behavior Description: NA Does patient have access to weapons?: No Criminal Charges Pending?: No Does patient have a court date: No Is patient on probation?: No  Psychosis Hallucinations: None noted Delusions: None noted  Mental Status Report Appearance/Hygiene: Unremarkable Eye Contact: Fair Motor Activity: Freedom of movement Speech: Logical/coherent Level of Consciousness: Quiet/awake Mood: Depressed Affect: Appropriate to circumstance Anxiety Level: Minimal Thought Processes: Coherent, Relevant Judgement: Partial Orientation: Person, Place, Time Obsessive Compulsive Thoughts/Behaviors: None  Cognitive Functioning Concentration: Normal Memory: Recent Intact, Remote Intact Is patient IDD: No Insight: Fair Impulse Control: Fair Appetite: Good Have you had any weight changes? : No Change Sleep: Decreased Total Hours of Sleep: 3 Vegetative Symptoms: None  ADLScreening [facility] Assessment Services) Patient's cognitive ability adequate to safely complete daily activities?: Yes Patient able to express need for assistance with ADLs?: Yes Independently performs ADLs?: Yes (appropriate for developmental age)  Prior Inpatient Therapy Prior Inpatient Therapy: Yes Prior Therapy Dates: 2019, 2014 Prior Therapy Facilty/Provider(s): [facility]  Reason for Treatment: MH issues  Prior Outpatient Therapy Prior Outpatient Therapy: Yes Prior Therapy Dates: Ongoing [facility] Reason for Treatment: Med mang Does patient have an ACCT team?: No Does patient have Intensive  In-House Services?  : No Does patient have [facility] services? : No Does patient have P4CC services?: No  ADL  Screening (condition at time of admission) Patient's cognitive ability adequate to safely complete daily activities?: Yes Is the patient deaf or have difficulty hearing?: No Does the patient have difficulty seeing, even when wearing glasses/contacts?: No Does the patient have difficulty concentrating, remembering, or making decisions?: No Patient able to express need for assistance with ADLs?: Yes Does the patient have difficulty dressing or bathing?: No Independently performs ADLs?: Yes (appropriate for developmental age) Does the patient have difficulty walking or climbing stairs?: No Weakness of Legs: None Weakness of Arms/Hands: None  Home Assistive Devices/Equipment Home Assistive Devices/Equipment: None  Therapy Consults (therapy consults require a physician order) PT Evaluation Needed: No OT Evalulation Needed: No SLP Evaluation Needed: No Abuse/Neglect Assessment (Assessment to be complete while patient is alone) Physical Abuse: Denies Verbal Abuse: Denies Sexual Abuse: Denies Exploitation of patient/patient's resources: Denies Self-Neglect: Denies Values / Beliefs Cultural Requests During Hospitalization: None Spiritual Requests During Hospitalization: None Consults Spiritual Care Consult Needed: No Social Work Consult Needed: No Regulatory affairs officer (For Healthcare) Does Patient Have a Medical Advance Directive?: No Would patient like information on creating a medical advance directive?: No - Patient declined          Disposition: Case was staffed with Reita Cliche DNP who recommended a inpatient admission to assist with stabilization. Disposition Initial Assessment Completed for this Encounter: Yes Disposition of Patient: Admit Type of inpatient treatment program: Adult Patient refused recommended treatment: No Mode of transportation if patient is discharged/movement?: (Unk)  On Site Evaluation by:   Reviewed with Physician:    Mamie Nick 04/30/2018 6:33 PM

## 2018-04-30 NOTE — Progress Notes (Addendum)
04/30/2018 at 2308: [facility] will accept patient 05/01/2018 after 1100  Room: [facility] Accepting: Hervey Ard Attending: Leanna Sato Call to Report: [phone]  Address: [place], [place] [address]  This information was provided to pt's nurse, Luis Abed, at [phone].

## 2018-04-30 NOTE — ED Triage Notes (Signed)
Pt BIB [facility] and presents with depression and anxiety.  Pt has been dealing with these issues and was feeling suicidal today.  Pt states he hasn't taken Embril for a few days since he hasn't been able to get to the [place].

## 2018-05-01 MED ORDER — ZOLPIDEM TARTRATE 5 MG PO TABS
5.0000 mg | ORAL_TABLET | Freq: Every evening | ORAL | Status: DC | PRN
  Administered 2018-05-01: 5 mg via ORAL
  Filled 2018-05-01: qty 1

## 2018-05-01 NOTE — ED Notes (Signed)
[facility] called 

## 2018-10-10 ENCOUNTER — Emergency Department (HOSPITAL_COMMUNITY): Payer: No Typology Code available for payment source

## 2018-10-10 ENCOUNTER — Other Ambulatory Visit: Payer: Self-pay

## 2018-10-10 ENCOUNTER — Emergency Department (HOSPITAL_COMMUNITY)
Admission: EM | Admit: 2018-10-10 | Discharge: 2018-10-13 | Disposition: A | Discharge status: Home / Self Care | Payer: No Typology Code available for payment source | Attending: Emergency Medicine | Admitting: Emergency Medicine

## 2018-10-10 ENCOUNTER — Encounter (HOSPITAL_COMMUNITY): Payer: Self-pay | Admitting: *Deleted

## 2018-10-10 DIAGNOSIS — Z20828 Contact with and (suspected) exposure to other viral communicable diseases: Secondary | ICD-10-CM | POA: Insufficient documentation

## 2018-10-10 DIAGNOSIS — I10 Essential (primary) hypertension: Secondary | ICD-10-CM | POA: Insufficient documentation

## 2018-10-10 DIAGNOSIS — R0602 Shortness of breath: Secondary | ICD-10-CM | POA: Diagnosis not present

## 2018-10-10 DIAGNOSIS — F339 Major depressive disorder, recurrent, unspecified: Secondary | ICD-10-CM

## 2018-10-10 DIAGNOSIS — E119 Type 2 diabetes mellitus without complications: Secondary | ICD-10-CM | POA: Insufficient documentation

## 2018-10-10 DIAGNOSIS — F329 Major depressive disorder, single episode, unspecified: Secondary | ICD-10-CM | POA: Diagnosis not present

## 2018-10-10 DIAGNOSIS — J449 Chronic obstructive pulmonary disease, unspecified: Secondary | ICD-10-CM | POA: Diagnosis not present

## 2018-10-10 DIAGNOSIS — R45851 Suicidal ideations: Secondary | ICD-10-CM | POA: Insufficient documentation

## 2018-10-10 DIAGNOSIS — F17210 Nicotine dependence, cigarettes, uncomplicated: Secondary | ICD-10-CM | POA: Insufficient documentation

## 2018-10-10 DIAGNOSIS — F4325 Adjustment disorder with mixed disturbance of emotions and conduct: Secondary | ICD-10-CM | POA: Diagnosis present

## 2018-10-10 DIAGNOSIS — F331 Major depressive disorder, recurrent, moderate: Secondary | ICD-10-CM | POA: Diagnosis not present

## 2018-10-10 DIAGNOSIS — Z79899 Other long term (current) drug therapy: Secondary | ICD-10-CM | POA: Diagnosis not present

## 2018-10-10 DIAGNOSIS — Z794 Long term (current) use of insulin: Secondary | ICD-10-CM | POA: Diagnosis not present

## 2018-10-10 LAB — CBC
HCT: 48.4 % (ref 39.0–52.0)
Hemoglobin: 16.2 g/dL (ref 13.0–17.0)
MCH: 32.5 pg (ref 26.0–34.0)
MCHC: 33.5 g/dL (ref 30.0–36.0)
MCV: 97.2 fL (ref 80.0–100.0)
Platelets: 282 10*3/uL (ref 150–400)
RBC: 4.98 MIL/uL (ref 4.22–5.81)
RDW: 13.3 % (ref 11.5–15.5)
WBC: 11.7 10*3/uL — ABNORMAL HIGH (ref 4.0–10.5)
nRBC: 0 % (ref 0.0–0.2)

## 2018-10-10 LAB — COMPREHENSIVE METABOLIC PANEL
ALT: 19 U/L (ref 0–44)
AST: 19 U/L (ref 15–41)
Albumin: 3.7 g/dL (ref 3.5–5.0)
Alkaline Phosphatase: 74 U/L (ref 38–126)
Anion gap: 10 (ref 5–15)
BUN: 13 mg/dL (ref 8–23)
CO2: 24 mmol/L (ref 22–32)
Calcium: 9 mg/dL (ref 8.9–10.3)
Chloride: 102 mmol/L (ref 98–111)
Creatinine, Ser: 1.1 mg/dL (ref 0.61–1.24)
GFR calc Af Amer: >60 mL/min (ref >60)
GFR calc non Af Amer: >60 mL/min (ref >60)
Glucose, Bld: 118 mg/dL — ABNORMAL HIGH (ref 70–99)
Potassium: 4.3 mmol/L (ref 3.5–5.1)
Sodium: 136 mmol/L (ref 135–145)
Total Bilirubin: 0.6 mg/dL (ref 0.3–1.2)
Total Protein: 6.8 g/dL (ref 6.5–8.1)

## 2018-10-10 LAB — ETHANOL: Alcohol, Ethyl (B): <10 mg/dL (ref <10)

## 2018-10-10 LAB — TROPONIN I (HIGH SENSITIVITY): Troponin I (High Sensitivity): 6 ng/L (ref <18)

## 2018-10-10 LAB — ACETAMINOPHEN LEVEL: Acetaminophen (Tylenol), Serum: <10 ug/mL — ABNORMAL LOW (ref 10–30)

## 2018-10-10 LAB — SALICYLATE LEVEL: Salicylate Lvl: <7 mg/dL (ref 2.8–30.0)

## 2018-10-10 MED ORDER — CYCLOBENZAPRINE HCL 10 MG PO TABS
10.0000 mg | ORAL_TABLET | Freq: Every day | ORAL | Status: DC
  Administered 2018-10-10 – 2018-10-12 (×3): 10 mg via ORAL
  Filled 2018-10-10 (×3): qty 1

## 2018-10-10 MED ORDER — FAMOTIDINE 20 MG PO TABS
20.0000 mg | ORAL_TABLET | Freq: Two times a day (BID) | ORAL | Status: DC
  Administered 2018-10-10 – 2018-10-13 (×6): 20 mg via ORAL
  Filled 2018-10-10 (×6): qty 1

## 2018-10-10 MED ORDER — CITALOPRAM HYDROBROMIDE 10 MG PO TABS
20.0000 mg | ORAL_TABLET | Freq: Every day | ORAL | Status: DC
  Administered 2018-10-11 – 2018-10-13 (×3): 20 mg via ORAL
  Filled 2018-10-10 (×3): qty 2

## 2018-10-10 MED ORDER — DILTIAZEM HCL ER COATED BEADS 180 MG PO CP24
180.0000 mg | ORAL_CAPSULE | Freq: Every day | ORAL | Status: DC
  Administered 2018-10-10 – 2018-10-13 (×3): 180 mg via ORAL
  Filled 2018-10-10 (×4): qty 1

## 2018-10-10 MED ORDER — CLOBETASOL PROPIONATE 0.05 % EX OINT
1.0000 "application " | TOPICAL_OINTMENT | Freq: Two times a day (BID) | CUTANEOUS | Status: DC
  Administered 2018-10-11 – 2018-10-12 (×4): 1 via TOPICAL
  Filled 2018-10-10: qty 15

## 2018-10-10 MED ORDER — METHOTREXATE 2.5 MG PO TABS
15.0000 mg | ORAL_TABLET | ORAL | Status: DC
  Administered 2018-10-13: 15 mg via ORAL
  Filled 2018-10-10: qty 6

## 2018-10-10 MED ORDER — FOLIC ACID 1 MG PO TABS
1.0000 mg | ORAL_TABLET | Freq: Every day | ORAL | Status: DC
  Administered 2018-10-11 – 2018-10-13 (×3): 1 mg via ORAL
  Filled 2018-10-10 (×3): qty 1

## 2018-10-10 MED ORDER — ONDANSETRON 4 MG PO TBDP
4.0000 mg | ORAL_TABLET | Freq: Once | ORAL | Status: DC
  Filled 2018-10-10: qty 1

## 2018-10-10 MED ORDER — VITAMIN C 500 MG PO TABS
500.0000 mg | ORAL_TABLET | Freq: Every day | ORAL | Status: DC
  Administered 2018-10-11 – 2018-10-13 (×3): 500 mg via ORAL
  Filled 2018-10-10 (×3): qty 1

## 2018-10-10 MED ORDER — GUAIFENESIN 400 MG PO TABS: 400.0000 mg | ORAL_TABLET | Freq: Three times a day (TID) | ORAL | Status: DC

## 2018-10-10 MED ORDER — IBUPROFEN 400 MG PO TABS
600.0000 mg | ORAL_TABLET | Freq: Four times a day (QID) | ORAL | Status: DC | PRN
  Administered 2018-10-11: 600 mg via ORAL
  Filled 2018-10-10 (×2): qty 1

## 2018-10-10 MED ORDER — BUSPIRONE HCL 10 MG PO TABS
15.0000 mg | ORAL_TABLET | Freq: Two times a day (BID) | ORAL | Status: DC
  Administered 2018-10-10 – 2018-10-13 (×6): 15 mg via ORAL
  Filled 2018-10-10 (×6): qty 2

## 2018-10-10 MED ORDER — LORATADINE 10 MG PO TABS
10.0000 mg | ORAL_TABLET | Freq: Every day | ORAL | Status: DC
  Administered 2018-10-11 – 2018-10-13 (×3): 10 mg via ORAL
  Filled 2018-10-10 (×3): qty 1

## 2018-10-10 MED ORDER — CALCIUM CARBONATE-VITAMIN D 500-200 MG-UNIT PO TABS
1.0000 | ORAL_TABLET | Freq: Two times a day (BID) | ORAL | Status: DC
  Administered 2018-10-11 – 2018-10-13 (×5): 1 via ORAL
  Filled 2018-10-10 (×6): qty 1

## 2018-10-10 MED ORDER — ALBUTEROL SULFATE HFA 108 (90 BASE) MCG/ACT IN AERS: 2.0000 | INHALATION_SPRAY | Freq: Four times a day (QID) | RESPIRATORY_TRACT | Status: DC | PRN

## 2018-10-10 MED ORDER — VITAMIN D-3 25 MCG (1000 UT) PO CAPS: 1000.0000 [IU] | ORAL_CAPSULE | Freq: Every day | ORAL | Status: DC

## 2018-10-10 MED ORDER — METFORMIN HCL 500 MG PO TABS
1000.0000 mg | ORAL_TABLET | Freq: Two times a day (BID) | ORAL | Status: DC
  Administered 2018-10-11 – 2018-10-12 (×4): 1000 mg via ORAL
  Filled 2018-10-10 (×4): qty 2

## 2018-10-10 MED ORDER — ADULT MULTIVITAMIN W/MINERALS CH
1.0000 | ORAL_TABLET | Freq: Every day | ORAL | Status: DC
  Administered 2018-10-11 – 2018-10-13 (×3): 1 via ORAL
  Filled 2018-10-10 (×2): qty 1

## 2018-10-10 MED ORDER — ASPIRIN EC 81 MG PO TBEC
81.0000 mg | DELAYED_RELEASE_TABLET | Freq: Every day | ORAL | Status: DC
  Administered 2018-10-11 – 2018-10-13 (×3): 81 mg via ORAL
  Filled 2018-10-10 (×3): qty 1

## 2018-10-10 MED ORDER — VITAMIN D 25 MCG (1000 UNIT) PO TABS
1000.0000 [IU] | ORAL_TABLET | Freq: Every day | ORAL | Status: DC
  Administered 2018-10-11 – 2018-10-13 (×3): 1000 [IU] via ORAL
  Filled 2018-10-10 (×3): qty 1

## 2018-10-10 MED ORDER — DULOXETINE HCL 60 MG PO CPEP
60.0000 mg | ORAL_CAPSULE | Freq: Two times a day (BID) | ORAL | Status: DC
  Administered 2018-10-10 – 2018-10-13 (×6): 60 mg via ORAL
  Filled 2018-10-10 (×7): qty 1

## 2018-10-10 MED ORDER — MYCOPHENOLATE MOFETIL 250 MG PO CAPS
1500.0000 mg | ORAL_CAPSULE | Freq: Two times a day (BID) | ORAL | Status: DC
  Administered 2018-10-10 – 2018-10-13 (×6): 1500 mg via ORAL
  Filled 2018-10-10 (×8): qty 6

## 2018-10-10 MED ORDER — ACETAMINOPHEN 325 MG PO TABS
650.0000 mg | ORAL_TABLET | Freq: Once | ORAL | Status: AC
  Administered 2018-10-10: 23:00:00 650 mg via ORAL
  Filled 2018-10-10: qty 2

## 2018-10-10 MED ORDER — BUPROPION HCL ER (XL) 150 MG PO TB24
300.0000 mg | ORAL_TABLET | Freq: Every day | ORAL | Status: DC
  Administered 2018-10-11 – 2018-10-13 (×3): 300 mg via ORAL
  Filled 2018-10-10 (×3): qty 2

## 2018-10-10 MED ORDER — MELATONIN 3 MG PO TABS
6.0000 mg | ORAL_TABLET | Freq: Every day | ORAL | Status: DC
  Administered 2018-10-10 – 2018-10-12 (×3): 6 mg via ORAL
  Filled 2018-10-10 (×4): qty 2

## 2018-10-10 MED ORDER — LISINOPRIL 20 MG PO TABS
20.0000 mg | ORAL_TABLET | Freq: Every day | ORAL | Status: DC
  Administered 2018-10-11 – 2018-10-13 (×2): 20 mg via ORAL
  Filled 2018-10-10 (×3): qty 1

## 2018-10-10 MED ORDER — DEXTRAN 70-HYPROMELLOSE (PF) 0.1-0.3 % OP SOLN: 1.0000 [drp] | OPHTHALMIC | Status: DC

## 2018-10-10 MED ORDER — ETANERCEPT 50 MG/ML ~~LOC~~ SOSY: 50.0000 mg | PREFILLED_SYRINGE | SUBCUTANEOUS | Status: DC

## 2018-10-10 MED ORDER — HYPROMELLOSE (GONIOSCOPIC) 2.5 % OP SOLN
1.0000 [drp] | OPHTHALMIC | Status: DC | PRN
  Administered 2018-10-11 – 2018-10-12 (×2): 1 [drp] via OPHTHALMIC
  Filled 2018-10-10 (×2): qty 15

## 2018-10-10 MED ORDER — GABAPENTIN 300 MG PO CAPS
600.0000 mg | ORAL_CAPSULE | Freq: Two times a day (BID) | ORAL | Status: DC
  Administered 2018-10-10 – 2018-10-13 (×6): 600 mg via ORAL
  Filled 2018-10-10 (×6): qty 2

## 2018-10-10 MED ORDER — FLUOCINONIDE 0.05 % EX CREA: 1.0000 "application " | TOPICAL_CREAM | CUTANEOUS | Status: DC

## 2018-10-10 MED ORDER — TRAZODONE HCL 50 MG PO TABS
50.0000 mg | ORAL_TABLET | Freq: Every day | ORAL | Status: DC
  Administered 2018-10-10 – 2018-10-11 (×2): 75 mg via ORAL
  Administered 2018-10-12: 50 mg via ORAL
  Filled 2018-10-10 (×2): qty 2
  Filled 2018-10-10: qty 1

## 2018-10-10 MED ORDER — SALINE SPRAY 0.65 % NA SOLN
2.0000 | Freq: Four times a day (QID) | NASAL | Status: DC | PRN
  Filled 2018-10-10: qty 44

## 2018-10-10 MED ORDER — INSULIN GLARGINE 100 UNIT/ML ~~LOC~~ SOLN: 12.0000 [IU] | Freq: Every day | SUBCUTANEOUS | Status: DC

## 2018-10-10 MED ORDER — LURASIDONE HCL 40 MG PO TABS
40.0000 mg | ORAL_TABLET | Freq: Every day | ORAL | Status: DC
  Administered 2018-10-11 – 2018-10-12 (×2): 40 mg via ORAL
  Filled 2018-10-10 (×2): qty 1

## 2018-10-10 NOTE — ED Notes (Signed)
The pt report that he has been saving up oxycodone pills so that he can taKE AN OVERDOSE
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
## 2018-10-10 NOTE — ED Notes (Signed)
Pt just finished with tts food given

## 2018-10-10 NOTE — ED Provider Notes (Signed)
Blood pressure (!) 144/110, pulse 97, temperature 99.2 F (37.3 C), resp. rate (!) 180, height 6\' 2"  (1.88 m), weight (!) 145.2 kg, SpO2 95 %.  In short, Mario [name] is a 68 y.o. male with a chief complaint of Shortness of Breath and Psychiatric Evaluation .  Refer to the original H&P for additional details.  10:14 PM  Behavioral health is recommending inpatient.  Prior ED team ordered home medications.     Margette Fast, MD 10/10/18 2215

## 2018-10-10 NOTE — ED Notes (Signed)
Pt transported to xray 

## 2018-10-10 NOTE — ED Notes (Signed)
tts machine at bedside waiting for person to come on screen

## 2018-10-10 NOTE — ED Notes (Signed)
P-T C/O A HEADACHE ALSO
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
## 2018-10-10 NOTE — ED Notes (Signed)
TTS at bedside. 

## 2018-10-10 NOTE — ED Notes (Signed)
Pt has been medically cleared

## 2018-10-11 LAB — SARS CORONAVIRUS 2 BY RT PCR (HOSPITAL ORDER, PERFORMED IN ~~LOC~~ HOSPITAL LAB): SARS Coronavirus 2: NEGATIVE

## 2018-10-11 LAB — RAPID URINE DRUG SCREEN, HOSP PERFORMED
Amphetamines: NOT DETECTED
Barbiturates: NOT DETECTED
Benzodiazepines: NOT DETECTED
Cocaine: NOT DETECTED
Opiates: NOT DETECTED
Tetrahydrocannabinol: NOT DETECTED

## 2018-10-11 MED ORDER — ACETAMINOPHEN 500 MG PO TABS
1000.0000 mg | ORAL_TABLET | Freq: Once | ORAL | Status: AC
  Administered 2018-10-12: 1000 mg via ORAL
  Filled 2018-10-11: qty 2

## 2018-10-11 MED ORDER — ACETAMINOPHEN 325 MG PO TABS
650.0000 mg | ORAL_TABLET | Freq: Once | ORAL | Status: AC
  Administered 2018-10-11: 650 mg via ORAL
  Filled 2018-10-11: qty 2

## 2018-10-11 MED ORDER — PREDNISOLONE ACETATE 1 % OP SUSP
1.0000 [drp] | OPHTHALMIC | Status: DC | PRN
  Filled 2018-10-11: qty 5

## 2018-10-11 NOTE — ED Provider Notes (Signed)
  Physical Exam  BP 110/60 (BP Location: Left Arm)   Pulse 81   Temp 97.9 F (36.6 C) (Oral)   Resp 18   Ht 6\' 2"  (1.88 m)   Wt (!) 145.2 kg   SpO2 94%   BMI 41.09 kg/m   0830: Pt rounded on this morning. Sitting up on bed eating breakfast. ER work up reviewed without significant abnormalities.  Psych has recommended inpatient treatment. Pending psych placement. COVID negative on 8/12. Patient requesting prednisone eye drops for dry eyes and tramadol that he takes q8 hours.  Will order eye drops. Chart review shows in the past has reported suicidal ideation with plan to overdose on oxycodone. Previous suicide attempt. Will defer tramadol here and opt for tylenol instead.    Kinnie Feil, PA-C 10/11/18 [id number]    Sherwood Gambler, MD 10/11/18 [phone]

## 2018-10-11 NOTE — ED Notes (Signed)
Dinner tray ordered.

## 2018-10-11 NOTE — ED Notes (Signed)
Urine culture collected and sent to the main lab.

## 2018-10-11 NOTE — Progress Notes (Addendum)
CSW called [facility] and spoke to the AOD who stated that the facility is at capacity. Disposition will continue to assist with inpatient placement needs.   Referral information has also been sent to the following hospitals for review:  [place], [place], [place] Disposition [facility] [phone]

## 2018-10-11 NOTE — ED Notes (Signed)
Breakfast ordered 

## 2018-10-11 NOTE — ED Notes (Signed)
Lunch tray ordered 
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
## 2018-10-11 NOTE — ED Notes (Signed)
Patient refused Tylenol, states he just took Advil.

## 2018-10-11 NOTE — Progress Notes (Signed)
CSW faxed [place] referral to the following:  [place]   CSW and TTS will follow-up in effort to secure placement.   Netta Neat, MSW, LCSW Clinical Social Work

## 2018-10-12 DIAGNOSIS — F339 Major depressive disorder, recurrent, unspecified: Secondary | ICD-10-CM

## 2018-10-12 MED ORDER — ALUM & MAG HYDROXIDE-SIMETH 200-200-20 MG/5ML PO SUSP
30.0000 mL | Freq: Once | ORAL | Status: AC
  Administered 2018-10-12: 30 mL via ORAL
  Filled 2018-10-12: qty 30

## 2018-10-12 MED ORDER — LIDOCAINE VISCOUS HCL 2 % MT SOLN
15.0000 mL | Freq: Once | OROMUCOSAL | Status: AC
  Administered 2018-10-12: 14:00:00 15 mL via ORAL
  Filled 2018-10-12: qty 15

## 2018-10-12 NOTE — ED Provider Notes (Signed)
Patient labs, vitals, and RN notes reviewed.  Patient pending placement for suicidal thoughts. On evaluation, patient reporting chest burning.  It began when he was laying flat.  Begins substernal and radiates up to his throat.  Since sitting up, patient reports symptoms have improved.  No history of heartburn.  Patient was initially seen for shortness of breath, at that time he had reassuring cardiac work-up including a normal EKG.  Due to chest burning, will repeat EKG, although low suspicion for ACS at this time.  Likely heartburn.  GI cocktail given.  On reassessment, patient reports continued improvement with GI cocktail.  Case discussed with attending, Dr. Sedonia Small evaluated the patient.  Repeat EKG reviewed, no stemi. As I have very low suspicion for ACS, will hold on repeat blood work. discussed with pt. Discussed pt to inform staff of any worsening or new CP.    Franchot Heidelberg, PA-C 10/12/18 1500    Maudie Flakes, MD 10/13/18 1559

## 2018-10-12 NOTE — ED Notes (Signed)
Dinner tray ordered.

## 2018-10-12 NOTE — ED Notes (Signed)
Lunch tray ordered 
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
## 2018-10-12 NOTE — ED Notes (Signed)
Sitter in room with pt.

## 2018-10-13 DIAGNOSIS — F331 Major depressive disorder, recurrent, moderate: Secondary | ICD-10-CM

## 2018-10-13 DIAGNOSIS — R45851 Suicidal ideations: Secondary | ICD-10-CM
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
## 2018-10-13 NOTE — ED Notes (Signed)
Ordered bfast 

## 2018-10-13 NOTE — Discharge Instructions (Addendum)
Return for any new worsening symptoms.
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
## 2018-10-13 NOTE — ED Notes (Signed)
Patient verbalizes understanding of discharge instructions . Opportunity for questions and answers were provided . Armband removed by staff ,Pt discharged from ED. W/C  offered at D/C  and Declined W/C at D/C and was escorted to lobby by RN.  
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
## 2018-10-13 NOTE — ED Triage Notes (Signed)
AT time of DC Pt reported he does not use O2  All the time and travels with out nasal O2

## 2019-06-22 ENCOUNTER — Emergency Department (HOSPITAL_COMMUNITY): Payer: No Typology Code available for payment source

## 2019-06-22 ENCOUNTER — Inpatient Hospital Stay (HOSPITAL_COMMUNITY)
Admission: EM | Admit: 2019-06-22 | Discharge: 2019-06-25 | DRG: 191 | Disposition: A | Discharge status: Home Health | Payer: No Typology Code available for payment source | Attending: Internal Medicine | Admitting: Internal Medicine

## 2019-06-22 DIAGNOSIS — J441 Chronic obstructive pulmonary disease with (acute) exacerbation: Secondary | ICD-10-CM | POA: Diagnosis not present

## 2019-06-22 DIAGNOSIS — F419 Anxiety disorder, unspecified: Secondary | ICD-10-CM | POA: Diagnosis present

## 2019-06-22 DIAGNOSIS — R000 Tachycardia, unspecified: Secondary | ICD-10-CM | POA: Diagnosis present

## 2019-06-22 DIAGNOSIS — E1165 Type 2 diabetes mellitus with hyperglycemia: Secondary | ICD-10-CM | POA: Diagnosis present

## 2019-06-22 DIAGNOSIS — Z23 Encounter for immunization: Secondary | ICD-10-CM

## 2019-06-22 DIAGNOSIS — F329 Major depressive disorder, single episode, unspecified: Secondary | ICD-10-CM | POA: Diagnosis present

## 2019-06-22 DIAGNOSIS — Z794 Long term (current) use of insulin: Secondary | ICD-10-CM

## 2019-06-22 DIAGNOSIS — R739 Hyperglycemia, unspecified: Secondary | ICD-10-CM | POA: Diagnosis present

## 2019-06-22 DIAGNOSIS — I110 Hypertensive heart disease with heart failure: Secondary | ICD-10-CM | POA: Diagnosis present

## 2019-06-22 DIAGNOSIS — Z85828 Personal history of other malignant neoplasm of skin: Secondary | ICD-10-CM

## 2019-06-22 DIAGNOSIS — J439 Emphysema, unspecified: Secondary | ICD-10-CM | POA: Diagnosis not present

## 2019-06-22 DIAGNOSIS — R5381 Other malaise: Secondary | ICD-10-CM | POA: Diagnosis present

## 2019-06-22 DIAGNOSIS — I5032 Chronic diastolic (congestive) heart failure: Secondary | ICD-10-CM | POA: Diagnosis present

## 2019-06-22 DIAGNOSIS — Z915 Personal history of self-harm: Secondary | ICD-10-CM

## 2019-06-22 DIAGNOSIS — F17210 Nicotine dependence, cigarettes, uncomplicated: Secondary | ICD-10-CM | POA: Diagnosis present

## 2019-06-22 DIAGNOSIS — E119 Type 2 diabetes mellitus without complications: Secondary | ICD-10-CM

## 2019-06-22 DIAGNOSIS — Z79899 Other long term (current) drug therapy: Secondary | ICD-10-CM

## 2019-06-22 DIAGNOSIS — Z6841 Body Mass Index (BMI) 40.0 and over, adult: Secondary | ICD-10-CM

## 2019-06-22 DIAGNOSIS — Z7982 Long term (current) use of aspirin: Secondary | ICD-10-CM

## 2019-06-22 DIAGNOSIS — G4733 Obstructive sleep apnea (adult) (pediatric): Secondary | ICD-10-CM | POA: Diagnosis present

## 2019-06-22 DIAGNOSIS — Z9981 Dependence on supplemental oxygen: Secondary | ICD-10-CM

## 2019-06-22 DIAGNOSIS — R45851 Suicidal ideations: Secondary | ICD-10-CM

## 2019-06-22 DIAGNOSIS — Z20822 Contact with and (suspected) exposure to covid-19: Secondary | ICD-10-CM | POA: Diagnosis present

## 2019-06-22 DIAGNOSIS — F32A Depression, unspecified: Secondary | ICD-10-CM | POA: Diagnosis present

## 2019-06-22 DIAGNOSIS — J9611 Chronic respiratory failure with hypoxia: Secondary | ICD-10-CM | POA: Diagnosis present

## 2019-06-22 DIAGNOSIS — Z888 Allergy status to other drugs, medicaments and biological substances status: Secondary | ICD-10-CM

## 2019-06-22 LAB — CBC
HCT: 47.1 % (ref 39.0–52.0)
Hemoglobin: 15.3 g/dL (ref 13.0–17.0)
MCH: 32.6 pg (ref 26.0–34.0)
MCHC: 32.5 g/dL (ref 30.0–36.0)
MCV: 100.4 fL — ABNORMAL HIGH (ref 80.0–100.0)
Platelets: 296 10*3/uL (ref 150–400)
RBC: 4.69 MIL/uL (ref 4.22–5.81)
RDW: 13.4 % (ref 11.5–15.5)
WBC: 12.7 10*3/uL — ABNORMAL HIGH (ref 4.0–10.5)
nRBC: 0 % (ref 0.0–0.2)

## 2019-06-22 LAB — COMPREHENSIVE METABOLIC PANEL
ALT: 30 U/L (ref 0–44)
AST: 24 U/L (ref 15–41)
Albumin: 3.8 g/dL (ref 3.5–5.0)
Alkaline Phosphatase: 54 U/L (ref 38–126)
Anion gap: 12 (ref 5–15)
BUN: 21 mg/dL (ref 8–23)
CO2: 21 mmol/L — ABNORMAL LOW (ref 22–32)
Calcium: 9.3 mg/dL (ref 8.9–10.3)
Chloride: 103 mmol/L (ref 98–111)
Creatinine, Ser: 1.33 mg/dL — ABNORMAL HIGH (ref 0.61–1.24)
GFR calc Af Amer: >60 mL/min (ref >60)
GFR calc non Af Amer: 55 mL/min — ABNORMAL LOW (ref >60)
Glucose, Bld: 186 mg/dL — ABNORMAL HIGH (ref 70–99)
Potassium: 4.3 mmol/L (ref 3.5–5.1)
Sodium: 136 mmol/L (ref 135–145)
Total Bilirubin: 0.6 mg/dL (ref 0.3–1.2)
Total Protein: 7.1 g/dL (ref 6.5–8.1)

## 2019-06-22 LAB — ETHANOL: Alcohol, Ethyl (B): <10 mg/dL (ref <10)

## 2019-06-22 LAB — ACETAMINOPHEN LEVEL: Acetaminophen (Tylenol), Serum: <10 ug/mL — ABNORMAL LOW (ref 10–30)

## 2019-06-22 LAB — SALICYLATE LEVEL: Salicylate Lvl: <7 mg/dL — ABNORMAL LOW (ref 7.0–30.0)

## 2019-06-22 NOTE — ED Triage Notes (Signed)
Pt arrived by EMS for COPD exacerbation and SI. Per ems, pt was watching TV that triggered suicidal thoughts. Also reports exertional sob. EKG NSR, sats 93% CBG 133 bp 120/70

## 2019-06-23 ENCOUNTER — Other Ambulatory Visit: Payer: Self-pay

## 2019-06-23 ENCOUNTER — Encounter (HOSPITAL_COMMUNITY): Payer: Self-pay | Admitting: Emergency Medicine

## 2019-06-23 DIAGNOSIS — J9611 Chronic respiratory failure with hypoxia: Secondary | ICD-10-CM | POA: Diagnosis present

## 2019-06-23 DIAGNOSIS — Z79899 Other long term (current) drug therapy: Secondary | ICD-10-CM | POA: Diagnosis not present

## 2019-06-23 DIAGNOSIS — Z23 Encounter for immunization: Secondary | ICD-10-CM | POA: Diagnosis not present

## 2019-06-23 DIAGNOSIS — F17210 Nicotine dependence, cigarettes, uncomplicated: Secondary | ICD-10-CM | POA: Diagnosis present

## 2019-06-23 DIAGNOSIS — Z915 Personal history of self-harm: Secondary | ICD-10-CM | POA: Diagnosis not present

## 2019-06-23 DIAGNOSIS — Z888 Allergy status to other drugs, medicaments and biological substances status: Secondary | ICD-10-CM | POA: Diagnosis not present

## 2019-06-23 DIAGNOSIS — R45851 Suicidal ideations: Secondary | ICD-10-CM

## 2019-06-23 DIAGNOSIS — R739 Hyperglycemia, unspecified: Secondary | ICD-10-CM | POA: Diagnosis present

## 2019-06-23 DIAGNOSIS — F329 Major depressive disorder, single episode, unspecified: Secondary | ICD-10-CM | POA: Diagnosis present

## 2019-06-23 DIAGNOSIS — G4733 Obstructive sleep apnea (adult) (pediatric): Secondary | ICD-10-CM | POA: Diagnosis present

## 2019-06-23 DIAGNOSIS — Z20822 Contact with and (suspected) exposure to covid-19: Secondary | ICD-10-CM | POA: Diagnosis present

## 2019-06-23 DIAGNOSIS — Z9981 Dependence on supplemental oxygen: Secondary | ICD-10-CM | POA: Diagnosis not present

## 2019-06-23 DIAGNOSIS — R000 Tachycardia, unspecified: Secondary | ICD-10-CM | POA: Diagnosis present

## 2019-06-23 DIAGNOSIS — Z794 Long term (current) use of insulin: Secondary | ICD-10-CM | POA: Diagnosis not present

## 2019-06-23 DIAGNOSIS — I110 Hypertensive heart disease with heart failure: Secondary | ICD-10-CM | POA: Diagnosis present

## 2019-06-23 DIAGNOSIS — Z6841 Body Mass Index (BMI) 40.0 and over, adult: Secondary | ICD-10-CM | POA: Diagnosis not present

## 2019-06-23 DIAGNOSIS — J441 Chronic obstructive pulmonary disease with (acute) exacerbation: Secondary | ICD-10-CM

## 2019-06-23 DIAGNOSIS — E1165 Type 2 diabetes mellitus with hyperglycemia: Secondary | ICD-10-CM | POA: Diagnosis present

## 2019-06-23 DIAGNOSIS — F419 Anxiety disorder, unspecified: Secondary | ICD-10-CM | POA: Diagnosis present

## 2019-06-23 DIAGNOSIS — R5381 Other malaise: Secondary | ICD-10-CM | POA: Diagnosis present

## 2019-06-23 DIAGNOSIS — J439 Emphysema, unspecified: Secondary | ICD-10-CM | POA: Diagnosis present

## 2019-06-23 DIAGNOSIS — I5032 Chronic diastolic (congestive) heart failure: Secondary | ICD-10-CM | POA: Diagnosis present

## 2019-06-23 DIAGNOSIS — Z85828 Personal history of other malignant neoplasm of skin: Secondary | ICD-10-CM | POA: Diagnosis not present

## 2019-06-23 DIAGNOSIS — Z7982 Long term (current) use of aspirin: Secondary | ICD-10-CM | POA: Diagnosis not present

## 2019-06-23 HISTORY — DX: Suicidal ideations: R45.851

## 2019-06-23 LAB — CBC
HCT: 44.1 % (ref 39.0–52.0)
Hemoglobin: 14.4 g/dL (ref 13.0–17.0)
MCH: 32.5 pg (ref 26.0–34.0)
MCHC: 32.7 g/dL (ref 30.0–36.0)
MCV: 99.5 fL (ref 80.0–100.0)
Platelets: 280 10*3/uL (ref 150–400)
RBC: 4.43 MIL/uL (ref 4.22–5.81)
RDW: 13.3 % (ref 11.5–15.5)
WBC: 11.7 10*3/uL — ABNORMAL HIGH (ref 4.0–10.5)
nRBC: 0 % (ref 0.0–0.2)

## 2019-06-23 LAB — CBG MONITORING, ED
Glucose-Capillary: 349 mg/dL — ABNORMAL HIGH (ref 70–99)
Glucose-Capillary: 288 mg/dL — ABNORMAL HIGH (ref 70–99)

## 2019-06-23 LAB — RESPIRATORY PANEL BY RT PCR (FLU A&B, COVID)
Influenza A by PCR: NEGATIVE
Influenza B by PCR: NEGATIVE
SARS Coronavirus 2 by RT PCR: NEGATIVE

## 2019-06-23 LAB — HEMOGLOBIN A1C
Hgb A1c MFr Bld: 7.1 % — ABNORMAL HIGH (ref 4.8–5.6)
Mean Plasma Glucose: 157.07 mg/dL

## 2019-06-23 LAB — GLUCOSE, CAPILLARY
Glucose-Capillary: 276 mg/dL — ABNORMAL HIGH (ref 70–99)
Glucose-Capillary: 159 mg/dL — ABNORMAL HIGH (ref 70–99)

## 2019-06-23 LAB — RAPID URINE DRUG SCREEN, HOSP PERFORMED
Amphetamines: NOT DETECTED
Barbiturates: NOT DETECTED
Benzodiazepines: NOT DETECTED
Cocaine: NOT DETECTED
Opiates: NOT DETECTED
Tetrahydrocannabinol: NOT DETECTED

## 2019-06-23 LAB — HIV ANTIBODY (ROUTINE TESTING W REFLEX): HIV Screen 4th Generation wRfx: NONREACTIVE

## 2019-06-23 LAB — CREATININE, SERUM
Creatinine, Ser: 1.37 mg/dL — ABNORMAL HIGH (ref 0.61–1.24)
GFR calc Af Amer: >60 mL/min (ref >60)
GFR calc non Af Amer: 53 mL/min — ABNORMAL LOW (ref >60)

## 2019-06-23 LAB — TROPONIN I (HIGH SENSITIVITY)
Troponin I (High Sensitivity): 9 ng/L (ref <18)
Troponin I (High Sensitivity): 7 ng/L (ref <18)

## 2019-06-23 MED ORDER — ALBUTEROL SULFATE HFA 108 (90 BASE) MCG/ACT IN AERS: 2.0000 | INHALATION_SPRAY | Freq: Four times a day (QID) | RESPIRATORY_TRACT | Status: DC | PRN

## 2019-06-23 MED ORDER — TRAZODONE HCL 50 MG PO TABS
50.0000 mg | ORAL_TABLET | Freq: Every day | ORAL | Status: DC
  Administered 2019-06-23 – 2019-06-24 (×2): 50 mg via ORAL
  Filled 2019-06-23 (×2): qty 1

## 2019-06-23 MED ORDER — HEPARIN SODIUM (PORCINE) 5000 UNIT/ML IJ SOLN
5000.0000 [IU] | Freq: Three times a day (TID) | INTRAMUSCULAR | Status: DC
  Administered 2019-06-23 – 2019-06-25 (×8): 5000 [IU] via SUBCUTANEOUS
  Filled 2019-06-23 (×8): qty 1

## 2019-06-23 MED ORDER — ALBUTEROL SULFATE (2.5 MG/3ML) 0.083% IN NEBU
5.0000 mg | INHALATION_SOLUTION | Freq: Once | RESPIRATORY_TRACT | Status: AC
  Administered 2019-06-23: 5 mg via RESPIRATORY_TRACT
  Filled 2019-06-23: qty 6

## 2019-06-23 MED ORDER — PREDNISONE 20 MG PO TABS
50.0000 mg | ORAL_TABLET | Freq: Every day | ORAL | Status: DC
  Filled 2019-06-23: qty 3

## 2019-06-23 MED ORDER — INSULIN ASPART 100 UNIT/ML ~~LOC~~ SOLN: 0.0000 [IU] | Freq: Every day | SUBCUTANEOUS | Status: DC

## 2019-06-23 MED ORDER — MYCOPHENOLATE MOFETIL 250 MG PO CAPS: 500.0000 mg | ORAL_CAPSULE | Freq: Two times a day (BID) | ORAL | Status: DC

## 2019-06-23 MED ORDER — IPRATROPIUM BROMIDE HFA 17 MCG/ACT IN AERS
4.0000 | INHALATION_SPRAY | Freq: Once | RESPIRATORY_TRACT | Status: AC
  Administered 2019-06-23: 4 via RESPIRATORY_TRACT
  Filled 2019-06-23: qty 12.9

## 2019-06-23 MED ORDER — BUSPIRONE HCL 15 MG PO TABS
15.0000 mg | ORAL_TABLET | Freq: Every day | ORAL | Status: DC
  Administered 2019-06-23 – 2019-06-25 (×3): 15 mg via ORAL
  Filled 2019-06-23: qty 1
  Filled 2019-06-23: qty 2
  Filled 2019-06-23: qty 1

## 2019-06-23 MED ORDER — BUPROPION HCL ER (XL) 150 MG PO TB24
300.0000 mg | ORAL_TABLET | Freq: Every day | ORAL | Status: DC
  Administered 2019-06-23 – 2019-06-25 (×3): 300 mg via ORAL
  Filled 2019-06-23 (×3): qty 2

## 2019-06-23 MED ORDER — METHOCARBAMOL 500 MG PO TABS: 500.0000 mg | ORAL_TABLET | Freq: Three times a day (TID) | ORAL | Status: DC | PRN

## 2019-06-23 MED ORDER — ALBUTEROL SULFATE HFA 108 (90 BASE) MCG/ACT IN AERS
10.0000 | INHALATION_SPRAY | Freq: Once | RESPIRATORY_TRACT | Status: AC
  Administered 2019-06-23: 10 via RESPIRATORY_TRACT
  Filled 2019-06-23: qty 6.7

## 2019-06-23 MED ORDER — IPRATROPIUM BROMIDE 0.02 % IN SOLN
0.5000 mg | Freq: Once | RESPIRATORY_TRACT | Status: AC
  Administered 2019-06-23: 0.5 mg via RESPIRATORY_TRACT
  Filled 2019-06-23: qty 2.5

## 2019-06-23 MED ORDER — PREDNISOLONE ACETATE 1 % OP SUSP: 1.0000 [drp] | OPHTHALMIC | Status: DC | PRN

## 2019-06-23 MED ORDER — TRAMADOL HCL 50 MG PO TABS
50.0000 mg | ORAL_TABLET | Freq: Three times a day (TID) | ORAL | Status: DC | PRN
  Administered 2019-06-25: 50 mg via ORAL
  Filled 2019-06-23: qty 1

## 2019-06-23 MED ORDER — SALINE SPRAY 0.65 % NA SOLN
2.0000 | Freq: Four times a day (QID) | NASAL | Status: DC | PRN
  Filled 2019-06-23: qty 44

## 2019-06-23 MED ORDER — CYCLOBENZAPRINE HCL 10 MG PO TABS
10.0000 mg | ORAL_TABLET | Freq: Every day | ORAL | Status: DC
  Administered 2019-06-23 – 2019-06-24 (×2): 10 mg via ORAL
  Filled 2019-06-23 (×2): qty 1

## 2019-06-23 MED ORDER — MYCOPHENOLATE MOFETIL 250 MG PO CAPS
1500.0000 mg | ORAL_CAPSULE | Freq: Two times a day (BID) | ORAL | Status: DC
  Administered 2019-06-24 – 2019-06-25 (×2): 1500 mg via ORAL
  Filled 2019-06-23 (×5): qty 6

## 2019-06-23 MED ORDER — AZITHROMYCIN 250 MG PO TABS
500.0000 mg | ORAL_TABLET | Freq: Every day | ORAL | Status: DC
  Administered 2019-06-23 – 2019-06-25 (×3): 500 mg via ORAL
  Filled 2019-06-23 (×4): qty 2

## 2019-06-23 MED ORDER — INSULIN ASPART 100 UNIT/ML ~~LOC~~ SOLN
0.0000 [IU] | Freq: Three times a day (TID) | SUBCUTANEOUS | Status: DC
  Administered 2019-06-23: 8 [IU] via SUBCUTANEOUS
  Administered 2019-06-23: 11 [IU] via SUBCUTANEOUS
  Administered 2019-06-23: 3 [IU] via SUBCUTANEOUS
  Administered 2019-06-24: 5 [IU] via SUBCUTANEOUS
  Administered 2019-06-24: 2 [IU] via SUBCUTANEOUS
  Administered 2019-06-24: 8 [IU] via SUBCUTANEOUS
  Administered 2019-06-25: 3 [IU] via SUBCUTANEOUS
  Administered 2019-06-25: 2 [IU] via SUBCUTANEOUS

## 2019-06-23 MED ORDER — LEVALBUTEROL HCL 0.63 MG/3ML IN NEBU
0.6300 mg | INHALATION_SOLUTION | Freq: Four times a day (QID) | RESPIRATORY_TRACT | Status: DC
  Administered 2019-06-23: 0.63 mg via RESPIRATORY_TRACT
  Filled 2019-06-23: qty 3

## 2019-06-23 MED ORDER — DILTIAZEM HCL ER COATED BEADS 180 MG PO CP24
180.0000 mg | ORAL_CAPSULE | Freq: Every day | ORAL | Status: DC
  Administered 2019-06-23 – 2019-06-25 (×3): 180 mg via ORAL
  Filled 2019-06-23 (×3): qty 1

## 2019-06-23 MED ORDER — PREDNISONE 20 MG PO TABS
40.0000 mg | ORAL_TABLET | Freq: Every day | ORAL | Status: DC
  Administered 2019-06-24 – 2019-06-25 (×2): 40 mg via ORAL
  Filled 2019-06-23 (×2): qty 2

## 2019-06-23 MED ORDER — GABAPENTIN 300 MG PO CAPS
600.0000 mg | ORAL_CAPSULE | Freq: Two times a day (BID) | ORAL | Status: DC
  Administered 2019-06-23 – 2019-06-24 (×3): 600 mg via ORAL
  Filled 2019-06-23 (×3): qty 2

## 2019-06-23 MED ORDER — MAGNESIUM SULFATE 2 GM/50ML IV SOLN
2.0000 g | Freq: Once | INTRAVENOUS | Status: AC
  Administered 2019-06-23: 2 g via INTRAVENOUS
  Filled 2019-06-23: qty 50

## 2019-06-23 MED ORDER — METHYLPREDNISOLONE SODIUM SUCC 125 MG IJ SOLR
125.0000 mg | Freq: Once | INTRAMUSCULAR | Status: AC
  Administered 2019-06-23: 125 mg via INTRAVENOUS
  Filled 2019-06-23: qty 2

## 2019-06-23 MED ORDER — ASPIRIN EC 81 MG PO TBEC
81.0000 mg | DELAYED_RELEASE_TABLET | Freq: Every day | ORAL | Status: DC
  Administered 2019-06-23 – 2019-06-25 (×3): 81 mg via ORAL
  Filled 2019-06-23 (×3): qty 1

## 2019-06-23 MED ORDER — PNEUMOCOCCAL VAC POLYVALENT 25 MCG/0.5ML IJ INJ
0.5000 mL | INJECTION | INTRAMUSCULAR | Status: AC
  Administered 2019-06-25: 0.5 mL via INTRAMUSCULAR
  Filled 2019-06-23: qty 0.5

## 2019-06-23 MED ORDER — IPRATROPIUM-ALBUTEROL 0.5-2.5 (3) MG/3ML IN SOLN: 3.0000 mL | RESPIRATORY_TRACT | Status: DC | PRN

## 2019-06-23 MED ORDER — LURASIDONE HCL 40 MG PO TABS
40.0000 mg | ORAL_TABLET | Freq: Every day | ORAL | Status: DC
  Administered 2019-06-23 – 2019-06-24 (×2): 40 mg via ORAL
  Filled 2019-06-23 (×4): qty 1

## 2019-06-23 MED ORDER — AEROCHAMBER PLUS FLO-VU LARGE MISC
Status: AC
  Administered 2019-06-23: 1
  Filled 2019-06-23: qty 1

## 2019-06-23 MED ORDER — OXCARBAZEPINE 300 MG PO TABS
300.0000 mg | ORAL_TABLET | Freq: Three times a day (TID) | ORAL | Status: DC
  Administered 2019-06-23 – 2019-06-25 (×5): 300 mg via ORAL
  Filled 2019-06-23 (×6): qty 1

## 2019-06-23 MED ORDER — INSULIN ASPART 100 UNIT/ML ~~LOC~~ SOLN
5.0000 [IU] | Freq: Three times a day (TID) | SUBCUTANEOUS | Status: DC
  Administered 2019-06-23 – 2019-06-25 (×7): 5 [IU] via SUBCUTANEOUS

## 2019-06-23 MED ORDER — IPRATROPIUM-ALBUTEROL 0.5-2.5 (3) MG/3ML IN SOLN
3.0000 mL | Freq: Four times a day (QID) | RESPIRATORY_TRACT | Status: DC
  Administered 2019-06-23 (×2): 3 mL via RESPIRATORY_TRACT
  Filled 2019-06-23 (×3): qty 3

## 2019-06-23 MED ORDER — DULOXETINE HCL 60 MG PO CPEP
60.0000 mg | ORAL_CAPSULE | Freq: Two times a day (BID) | ORAL | Status: DC
  Administered 2019-06-23 – 2019-06-25 (×5): 60 mg via ORAL
  Filled 2019-06-23 (×6): qty 1

## 2019-06-23 MED ORDER — IPRATROPIUM BROMIDE 0.02 % IN SOLN
0.5000 mg | Freq: Four times a day (QID) | RESPIRATORY_TRACT | Status: DC
  Administered 2019-06-23: 0.5 mg via RESPIRATORY_TRACT
  Filled 2019-06-23: qty 2.5

## 2019-06-23 MED ORDER — METHYLPREDNISOLONE SODIUM SUCC 40 MG IJ SOLR: 40.0000 mg | Freq: Every day | INTRAMUSCULAR | Status: DC

## 2019-06-23 NOTE — Progress Notes (Signed)
Pt placed on CPAP with home mask and 4L O2 bled in. Pt tolerating well at this time. RT will continue to monitor.

## 2019-06-23 NOTE — Progress Notes (Addendum)
[facility] PROGRESS NOTE  Patient: Mario [name] EKC:[id number]   [facility] DOB: 1951-01-06   DOA: 06/22/2019   DOS: 06/23/2019    Subjective: Continues to report suicidal ideation.  Also tells me that he has a plan.  No nausea no vomiting.  He tells me that he has been treated for depression but given current environment he is feeling suicidal. Denies any chest pain denies any shortness of breath right now at rest but tells me that he gets short of breath even walking to the bathroom at home at his baseline.  He was brought to the hospital as he had trouble breathing even at rest.  Objective:  Vitals:   06/23/19 0600 06/23/19 0802  BP: (!) 105/49   Pulse: (!) 110   Resp: (!) 22   Temp:  98.4 F (36.9 C)  SpO2: 94%     General: Appear in mild distress, no Rash; Oral Mucosa Clear, moist. no Abnormal Neck Mass Or lumps, Conjunctiva normal  Cardiovascular: S1 and S2 Present, no Murmur, Respiratory: increased respiratory effort, Bilateral Air entry present and Clear to Auscultation, no Crackles, no wheezes Abdomen: Bowel Sound present, Soft and no tenderness, no hernia Extremities: no Pedal edema, no calf tenderness Neurology: alert and oriented to time, place, and person affect appropriate.    Assessment and plan: Patient was admitted by my colleague Dr. Humphrey Rolls earlier on 06/23/2019. I have reviewed the H&P as well as assessment and plan and agree with the same. Important changes in the plan are listed below.  Plan of care: Principal Problem:   COPD exacerbation ([place]) Appears mild in nature. Patient is on home oxygen. Transitioning to oral steroids. Continue as needed nebulizers. Continue home inhalers. Continue antibiotics for a few more days.  Active Problems:   Depression Suicidal ideation. Psychiatry consulted for inpatient psych admission. Resuming home medications.  Type 2 diabetes mellitus. Uncontrolled with hyperglycemia. No  complication. Monitor for now.    Sleep apnea Continue CPAP nightly.  Sinus tachycardia. Resume Cardizem. Change albuterol to Xopenex.  Change from telemetry to [place].  Author: [name], MD [facility] 06/23/2019 8:51 AM   If 7PM-7AM, please contact night-coverage at [email or web address]

## 2019-06-23 NOTE — ED Notes (Signed)
Pt wanded by security. 

## 2019-06-23 NOTE — ED Provider Notes (Signed)
[facility] Provider Note   CSN: [id number] Arrival date & time: 06/22/19  1858     History Chief Complaint  Patient presents with  . Suicidal  . Shortness of Breath    Mario [name] is a 69 y.o. male.   Shortness of Breath Severity:  Severe Onset quality:  Gradual Timing:  Constant Progression:  Worsening Chronicity:  Chronic Context: activity   Relieved by:  Nothing Worsened by:  Nothing Ineffective treatments:  Inhaler Associated symptoms: no abdominal pain and no fever   Risk factors: no recent alcohol use   Mental Health Problem Presenting symptoms: depression   Presenting symptoms: no agitation   Degree of incapacity (severity):  Severe Timing:  Constant Progression:  Unchanged Chronicity:  New Context: not alcohol use   Treatment compliance:  Untreated Relieved by:  Nothing Worsened by:  Nothing Ineffective treatments:  None tried Associated symptoms: no abdominal pain and no weight change   Risk factors: no hx of suicide attempts   Patient with COPD that says he does not wear O2 all the time just with his CPAP presents with worsening SOB and wheezing and chest tightness for 24-48 hours.  Also is suicidal and has a plan to OD on muscle relaxers.       Past Medical History:  Diagnosis Date  . Anxiety   . Bronchitis   . COPD (chronic obstructive pulmonary disease) ([place])   . Depression   . Hypertension   . Mental disorder   . MI (myocardial infarction) ([place])    ????  . OSA (obstructive sleep apnea)   . Suicide attempt ([place])   . Tension pneumothorax 06/27/2016    Patient Active Problem List   Diagnosis Date Noted  . Major depressive disorder, recurrent episode ([place]) 10/12/2018  . 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 Cellulitis of both feet 07/16/2017  . DM2 (diabetes mellitus, type 2) ([place]) 07/16/2017  . HLA B27 (HLA B27 positive) 07/16/2017  . Acute chest pain    . Hyperkalemia   . Spontaneous pneumothorax 06/27/2016  . Hypoxia   . Pneumothorax on left   . Dyspnea 05/23/2014  . COPD exacerbation ([place]) 05/23/2014  . Malingering 01/21/2013  . Depression 01/11/2013  . Tobacco abuse 01/11/2013  . Obesity, unspecified 01/11/2013  . Chest pain 01/10/2013  . HTN (hypertension) 01/10/2013    Past Surgical History:  Procedure Laterality Date  . CHEST TUBE INSERTION Left 06/27/2016  . cryptorchidism    . SKIN CANCER EXCISION         Family History  Problem Relation Age of Onset  . Dementia Father     Social History   Tobacco Use  . Smoking status: Current Every Day Smoker    Packs/day: 0.50    Years: 20.00    Pack years: 10.00    Types: Cigarettes  . Smokeless tobacco: Never Used  Substance Use Topics  . Alcohol use: No    Alcohol/week: 0.0 standard drinks    Comment: denies use of any drugs or alcohol  . Drug use: No    Home Medications Prior to Admission medications   Medication Sig Start Date End Date Taking? Authorizing Provider  albuterol (PROAIR HFA) 108 (90 Base) MCG/ACT inhaler Inhale 2 puffs into the lungs every 6 (six) hours as needed for wheezing or shortness of breath.    [provider]  aspirin EC 81 MG tablet Take 81 mg by mouth daily.  [provider]  buPROPion (WELLBUTRIN XL) 300 MG 24 hr tablet Take 300 mg by mouth daily after breakfast.     [provider]  busPIRone (BUSPAR) 15 MG tablet Take 15 mg by mouth 2 (two) times daily.    [provider]  calcium-vitamin D (OSCAL WITH D) 500-200 MG-UNIT tablet Take 1 tablet by mouth 2 (two) times daily with a meal.    [provider]  Cholecalciferol (VITAMIN D-3) 25 MCG (1000 UT) CAPS Take 1,000 Units by mouth daily with breakfast.    [provider]  citalopram (CELEXA) 20 MG tablet Take 20 mg by mouth daily. 05/12/18 05/12/19  [provider]  clobetasol ointment (TEMOVATE) 1.61 % Apply 1 application  topically See admin instructions. Apply a small amount to healed areas of legs 2 times a day as directed- avoid face and genitals    [provider]  cyclobenzaprine (FLEXERIL) 10 MG tablet Take 10 mg by mouth at bedtime.    [provider]  Dextran 70-Hypromellose (ARTIFICIAL TEARS PF OP) Place 1-2 drops into both eyes every 2 (two) hours.     [provider]  diltiazem (CARDIZEM CD) 180 MG 24 hr capsule Take 180 mg by mouth daily.    [provider]  DULoxetine (CYMBALTA) 60 MG capsule Take 1 capsule (60 mg total) by mouth 2 (two) times daily. Patient taking differently: Take 60 mg by mouth 2 (two) times daily.  07/21/17   Lavina Hamman, MD  etanercept (ENBREL) 50 MG/ML injection Inject 50 mg into the skin every Wednesday.     [provider]  fluocinonide cream (LIDEX) 0.96 % Apply 1 application topically See admin instructions. Apply a small amount to affected areas 2 times a day    [provider]  folic acid (FOLVITE) 1 MG tablet Take 1 mg by mouth daily.    [provider]  gabapentin (NEURONTIN) 300 MG capsule Take 2 capsules (600 mg total) by mouth 2 (two) times daily. Patient taking differently: Take 900 mg by mouth 2 (two) times daily.  07/06/16   Nani Skillern, PA-C  guaifenesin (HUMIBID E) 400 MG TABS tablet Take 400 mg by mouth 3 (three) times daily.    [provider]  ibuprofen (ADVIL) 600 MG tablet Take 600 mg by mouth every 6 (six) hours as needed (for headaches).    [provider]  insulin glargine (LANTUS) 100 UNIT/ML injection Inject 12 Units into the skin at bedtime.    [provider]  lidocaine (XYLOCAINE) 5 % ointment Apply topically as needed (at dressing changes). Patient not taking: Reported on 10/10/2018 07/21/17   Lavina Hamman, MD  lisinopril (PRINIVIL,ZESTRIL) 20 MG tablet Take 1 tablet (20 mg total) by mouth daily. For high blood pressure Patient taking differently: Take  20 mg by mouth daily.  01/21/13   Nena Polio T, PA-C  lurasidone (LATUDA) 40 MG TABS tablet Take 40 mg by mouth daily after supper.    [provider]  Melatonin 3 MG TABS Take 6 mg by mouth at bedtime.    [provider]  metFORMIN (GLUCOPHAGE) 1000 MG tablet Take 1,000 mg by mouth 2 (two) times daily with a meal.    [provider]  methocarbamol (ROBAXIN) 500 MG tablet Take 500 mg by mouth 3 (three) times daily as needed (for muscular pain).    [provider]  methotrexate (RHEUMATREX) 2.5 MG tablet Take 6 tablets (15 mg total) by mouth  every Saturday. Hold until seen by rheumatology  Caution:Chemotherapy. Protect from light. Patient taking differently: Take 15 mg by mouth every Saturday. Caution:Chemotherapy. Protect from light. 07/22/17   Lavina Hamman, MD  Multiple Vitamin (MULTIVITAMIN WITH MINERALS) TABS tablet Take 1 tablet by mouth daily.    [provider]  mycophenolate (CELLCEPT) 250 MG capsule Take 1,500 mg by mouth 2 (two) times daily.    [provider]  OXYGEN Inhale 2 L/min into the lungs See admin instructions. 2 liters/min at bedtime and during all times of exertion    [provider]  prednisoLONE acetate (PRED FORTE) 1 % ophthalmic suspension Place 1 drop into the left eye every 2 (two) hours as needed (as directed).     [provider]  silver sulfADIAZINE (SILVADENE) 1 % cream Apply topically daily. Patient not taking: Reported on 10/10/2018 07/21/17   Lavina Hamman, MD  sodium chloride (OCEAN) 0.65 % SOLN nasal spray Place 2 sprays into both nostrils 4 (four) times daily as needed for congestion.    [provider]  tiotropium (SPIRIVA) 18 MCG inhalation capsule Place 1 capsule (18 mcg total) into inhaler and inhale daily. Patient not taking: Reported on 10/10/2018 05/26/14   Cristal Ford, DO  Tiotropium Bromide-Olodaterol 2.5-2.5 MCG/ACT AERS Inhale 2 puffs into the lungs every morning.     [provider]  traMADol (ULTRAM) 50 MG tablet Take 100 mg by mouth 3 (three) times daily as needed (for pain).     [provider]  traZODone (DESYREL) 50 MG tablet Take 50-75 mg by mouth at bedtime.     [provider]  vitamin C (ASCORBIC ACID) 500 MG tablet Take 500 mg by mouth daily.    [provider]    Allergies    Demerol [meperidine], Zocor [simvastatin], Beet [beta vulgaris], and Liver  Review of Systems   Review of Systems  Constitutional: Negative for fever.  HENT: Negative for congestion.   Eyes: Negative for visual disturbance.  Respiratory: Positive for shortness of breath.   Gastrointestinal: Negative for abdominal pain.  Endocrine: Negative for polyuria.  Genitourinary: Negative for difficulty urinating.  Musculoskeletal: Negative for arthralgias.  Neurological: Negative for dizziness.  Psychiatric/Behavioral: Negative for agitation.  All other systems reviewed and are negative.   Physical Exam Updated Vital Signs BP 134/81 (BP Location: Left Arm)   Pulse 98   Temp 98.5 F (36.9 C) (Oral)   Resp 18   Ht '6\' 2"'  (1.88 m)   Wt (!) 149.7 kg   SpO2 92%   BMI 42.37 kg/m   Physical Exam Vitals and nursing note reviewed.  Constitutional:      General: He is not in acute distress.    Appearance: Normal appearance.  HENT:     Head: Normocephalic and atraumatic.     Nose: Nose normal.  Eyes:     Conjunctiva/sclera: Conjunctivae normal.     Pupils: Pupils are equal, round, and reactive to light.  Cardiovascular:     Rate and Rhythm: Normal rate and regular rhythm.     Pulses: Normal pulses.     Heart sounds: Normal heart sounds.  Pulmonary:     Breath sounds: Wheezing present.  Abdominal:     General: Abdomen is flat. Bowel sounds are normal.     Tenderness: There is no abdominal tenderness. There is no guarding or rebound.  Musculoskeletal:        General: Normal range of motion.     Cervical back:  Normal range  of motion and neck supple.  Skin:    General: Skin is warm and dry.     Capillary Refill: Capillary refill takes less than 2 seconds.  Neurological:     General: No focal deficit present.     Mental Status: He is alert and oriented to person, place, and time.  Psychiatric:        Thought Content: Thought content includes suicidal ideation. Thought content includes suicidal plan.     ED Results / Procedures / Treatments   Labs (all labs ordered are listed, but only abnormal results are displayed) Results for orders placed or performed during the hospital encounter of 06/22/19  Comprehensive metabolic panel  Result Value Ref Range   Sodium 136 135 - 145 mmol/L   Potassium 4.3 3.5 - 5.1 mmol/L   Chloride 103 98 - 111 mmol/L   CO2 21 (L) 22 - 32 mmol/L   Glucose, Bld 186 (H) 70 - 99 mg/dL   BUN 21 8 - 23 mg/dL   Creatinine, Ser 1.33 (H) 0.61 - 1.24 mg/dL   Calcium 9.3 8.9 - 10.3 mg/dL   Total Protein 7.1 6.5 - 8.1 g/dL   Albumin 3.8 3.5 - 5.0 g/dL   AST 24 15 - 41 U/L   ALT 30 0 - 44 U/L   Alkaline Phosphatase 54 38 - 126 U/L   Total Bilirubin 0.6 0.3 - 1.2 mg/dL   GFR calc non Af Amer 55 (L) >60 mL/min   GFR calc Af Amer >60 >60 mL/min   Anion gap 12 5 - 15  Ethanol  Result Value Ref Range   Alcohol, Ethyl (B) <33 <82 mg/dL  Salicylate level  Result Value Ref Range   Salicylate Lvl <5.0 (L) 7.0 - 30.0 mg/dL  Acetaminophen level  Result Value Ref Range   Acetaminophen (Tylenol), Serum <10 (L) 10 - 30 ug/mL  cbc  Result Value Ref Range   WBC 12.7 (H) 4.0 - 10.5 K/uL   RBC 4.69 4.22 - 5.81 MIL/uL   Hemoglobin 15.3 13.0 - 17.0 g/dL   HCT 47.1 39.0 - 52.0 %   MCV 100.4 (H) 80.0 - 100.0 fL   MCH 32.6 26.0 - 34.0 pg   MCHC 32.5 30.0 - 36.0 g/dL   RDW 13.4 11.5 - 15.5 %   Platelets 296 150 - 400 K/uL   nRBC 0.0 0.0 - 0.2 %   DG Chest 2 View  Result Date: 06/22/2019 CLINICAL DATA:  COPD exacerbation, suicidal ideation EXAM: CHEST - 2 VIEW COMPARISON:  10/10/2018  FINDINGS: Frontal and lateral views of the chest demonstrate a stable cardiac silhouette. Extensive background emphysema with bibasilar scarring unchanged. No airspace disease, effusion, or pneumothorax. No acute bony abnormalities. IMPRESSION: 1. Severe emphysema, no acute process. Electronically Signed   By: Randa Ngo M.D.   On: 06/22/2019 21:31    EKG EKG Interpretation  Date/Time:  Saturday Carling Liberman 24 2021 20:37:11 EDT Ventricular Rate:  99 PR Interval:  178 QRS Duration: 116 QT Interval:  364 QTC Calculation: 467 R Axis:   24 Text Interpretation: Normal sinus rhythm Confirmed by Randal Buba, Sharlon Pfohl ([id number]) on 06/23/2019 12:14:37 AM   Radiology DG Chest 2 View  Result Date: 06/22/2019 CLINICAL DATA:  COPD exacerbation, suicidal ideation EXAM: CHEST - 2 VIEW COMPARISON:  10/10/2018 FINDINGS: Frontal and lateral views of the chest demonstrate a stable cardiac silhouette. Extensive background emphysema with bibasilar scarring unchanged. No airspace disease, effusion, or pneumothorax. No acute bony abnormalities. IMPRESSION:  1. Severe emphysema, no acute process. Electronically Signed   By: Randa Ngo M.D.   On: 06/22/2019 21:31    Procedures Procedures (including critical care time)  Medications Ordered in ED Medications  magnesium sulfate IVPB 2 g 50 mL (has no administration in time range)  albuterol (VENTOLIN HFA) 108 (90 Base) MCG/ACT inhaler 10 puff (10 puffs Inhalation Given 06/23/19 0055)  ipratropium (ATROVENT HFA) inhaler 4 puff (4 puffs Inhalation Given 06/23/19 0055)  methylPREDNISolone sodium succinate (SOLU-MEDROL) 125 mg/2 mL injection 125 mg (125 mg Intravenous Given 06/23/19 0048)  AeroChamber Plus Flo-Vu Large MISC (1 each  Given 06/23/19 0102)    ED Course  I have reviewed the triage vital signs and the nursing notes.  Pertinent labs & imaging results that were available during my care of the patient were reviewed by me and considered in my medical decision making  (see chart for details).    Cannot be cleared medically at this time.  Will need inpatient admission for COPD exacerbation followed by psychiatric evaluation for SI. Final Clinical Impression(s) / ED Diagnoses Final diagnoses:  None   Admit to medicine.   Rx / DC Orders ED Discharge Orders    None       Juanetta Negash, MD 06/23/19 4840

## 2019-06-23 NOTE — H&P (Signed)
History and Physical    Mario [name] NUU:[id number] DOB: 27-Sep-1950 DOA: 06/22/2019  PCP: [facility] (Confirm with patient/family/NH records and if not entered, this has to be entered at [facility] point of entry) Patient coming from: Home  I have personally briefly reviewed patient's old medical records in [place]  Chief Complaint: Shortness of breath and suicidal ideation  HPI: Mario [name] is a 69 y.o. male with medical history significant of hypertension, COPD, depression and sleep apnea presented to ED for evaluation of shortness of breath and suicidal ideation.  Patient states that he started having shortness of breath that started yesterday and continue to worsen along with episodic cough and his condition did not improve with home inhaler and oxygenation.  Patient states that he had COPD exacerbation multiple times in the past.  Patient admits of having some chills but no fever along with productive cough but denies chest pain, nausea, vomiting, abdominal pain and urinary symptoms.  Patient states that he has history of depression but he suddenly started having suicidal thoughts today.  Has no history of previous suicidal attempts.  ED Course: On arrival to the ED patient had temperature of 98.5, blood pressure 134/81, heart rate 98, respiratory rate 18 and oxygen saturation 92% on room air.  Blood work showed WBC 12.7, hemoglobin 15.3, BUN 21, creatinine 1.3, blood glucose 186.  Showed severe emphysema but no acute cardiopulmonary problem.  Patient was started on oxygen with nasal cannula and given IV Solu-Medrol, with ipratropium and albuterol puffs.  Patient was also given magnesium sulfate.  Review of Systems: As per HPI otherwise 10 point review of systems negative.    Past Medical History:  Diagnosis Date  . Anxiety   . Bronchitis   . COPD (chronic obstructive pulmonary disease) ([place])   . Depression   . Hypertension   . Mental disorder   . MI (myocardial  infarction) ([place])    ????  . OSA (obstructive sleep apnea)   . Suicide attempt ([place])   . Tension pneumothorax 06/27/2016    Past Surgical History:  Procedure Laterality Date  . CHEST TUBE INSERTION Left 06/27/2016  . cryptorchidism    . SKIN CANCER EXCISION       reports that he has been smoking cigarettes. He has a 10.00 pack-year smoking history. He has never used smokeless tobacco. He reports that he does not drink alcohol or use drugs.  Allergies  Allergen Reactions  . Demerol [Meperidine] Nausea And Vomiting and Other (See Comments)    Violently sick  . Zocor [Simvastatin] Nausea And Vomiting and Other (See Comments)    Made him very jittery, also  . Beet [Beta Vulgaris] Nausea And Vomiting  . Liver Nausea And Vomiting    Family History  Problem Relation Age of Onset  . Dementia Father      Prior to Admission medications   Medication Sig Start Date End Date Taking? Authorizing Provider  albuterol (PROAIR HFA) 108 (90 Base) MCG/ACT inhaler Inhale 2 puffs into the lungs every 6 (six) hours as needed for wheezing or shortness of breath.    [provider]  aspirin EC 81 MG tablet Take 81 mg by mouth daily.    [provider]  buPROPion (WELLBUTRIN XL) 300 MG 24 hr tablet Take 300 mg by mouth daily after breakfast.     [provider]  busPIRone (BUSPAR) 15 MG tablet Take 15 mg by mouth 2 (two) times daily.    [provider]  calcium-vitamin D (OSCAL WITH D) 500-200 MG-UNIT tablet Take 1 tablet by mouth 2 (two) times daily with a meal.    [provider]  Cholecalciferol (VITAMIN D-3) 25 MCG (1000 UT) CAPS Take 1,000 Units by mouth daily with breakfast.    [provider]  citalopram (CELEXA) 20 MG tablet Take 20 mg by mouth daily. 05/12/18 05/12/19  [provider]  clobetasol ointment (TEMOVATE) 1.61 % Apply 1 application topically See admin instructions. Apply a small amount to healed areas of legs 2 times a  day as directed- avoid face and genitals    [provider]  cyclobenzaprine (FLEXERIL) 10 MG tablet Take 10 mg by mouth at bedtime.    [provider]  Dextran 70-Hypromellose (ARTIFICIAL TEARS PF OP) Place 1-2 drops into both eyes every 2 (two) hours.     [provider]  diltiazem (CARDIZEM CD) 180 MG 24 hr capsule Take 180 mg by mouth daily.    [provider]  DULoxetine (CYMBALTA) 60 MG capsule Take 1 capsule (60 mg total) by mouth 2 (two) times daily. Patient taking differently: Take 60 mg by mouth 2 (two) times daily.  07/21/17   Lavina Hamman, MD  etanercept (ENBREL) 50 MG/ML injection Inject 50 mg into the skin every Wednesday.     [provider]  fluocinonide cream (LIDEX) 0.96 % Apply 1 application topically See admin instructions. Apply a small amount to affected areas 2 times a day    [provider]  folic acid (FOLVITE) 1 MG tablet Take 1 mg by mouth daily.    [provider]  gabapentin (NEURONTIN) 300 MG capsule Take 2 capsules (600 mg total) by mouth 2 (two) times daily. Patient taking differently: Take 900 mg by mouth 2 (two) times daily.  07/06/16   Nani Skillern, PA-C  guaifenesin (HUMIBID E) 400 MG TABS tablet Take 400 mg by mouth 3 (three) times daily.    [provider]  ibuprofen (ADVIL) 600 MG tablet Take 600 mg by mouth every 6 (six) hours as needed (for headaches).    [provider]  insulin glargine (LANTUS) 100 UNIT/ML injection Inject 12 Units into the skin at bedtime.    [provider]  lidocaine (XYLOCAINE) 5 % ointment Apply topically as needed (at dressing changes). Patient not taking: Reported on 10/10/2018 07/21/17   Lavina Hamman, MD  lisinopril (PRINIVIL,ZESTRIL) 20 MG tablet Take 1 tablet (20 mg total) by mouth daily. For high blood pressure Patient taking differently: Take 20 mg by mouth daily.  01/21/13   Nena Polio T, PA-C  lurasidone (LATUDA) 40 MG  TABS tablet Take 40 mg by mouth daily after supper.    [provider]  Melatonin 3 MG TABS Take 6 mg by mouth at bedtime.    [provider]  metFORMIN (GLUCOPHAGE) 1000 MG tablet Take 1,000 mg by mouth 2 (two) times daily with a meal.    [provider]  methocarbamol (ROBAXIN) 500 MG tablet Take 500 mg by mouth 3 (three) times daily as needed (for muscular pain).    [provider]  methotrexate (RHEUMATREX) 2.5 MG tablet Take 6 tablets (15 mg total) by mouth every Saturday. Hold until seen by rheumatology  Caution:Chemotherapy. Protect from light. Patient taking differently: Take 15 mg by mouth every Saturday. Caution:Chemotherapy. Protect from light. 07/22/17   Lavina Hamman, MD  Multiple Vitamin (MULTIVITAMIN WITH MINERALS) TABS tablet Take 1 tablet by mouth daily.  [provider]  mycophenolate (CELLCEPT) 250 MG capsule Take 1,500 mg by mouth 2 (two) times daily.    [provider]  OXYGEN Inhale 2 L/min into the lungs See admin instructions. 2 liters/min at bedtime and during all times of exertion    [provider]  prednisoLONE acetate (PRED FORTE) 1 % ophthalmic suspension Place 1 drop into the left eye every 2 (two) hours as needed (as directed).     [provider]  silver sulfADIAZINE (SILVADENE) 1 % cream Apply topically daily. Patient not taking: Reported on 10/10/2018 07/21/17   Lavina Hamman, MD  sodium chloride (OCEAN) 0.65 % SOLN nasal spray Place 2 sprays into both nostrils 4 (four) times daily as needed for congestion.    [provider]  tiotropium (SPIRIVA) 18 MCG inhalation capsule Place 1 capsule (18 mcg total) into inhaler and inhale daily. Patient not taking: Reported on 10/10/2018 05/26/14   Cristal Ford, DO  Tiotropium Bromide-Olodaterol 2.5-2.5 MCG/ACT AERS Inhale 2 puffs into the lungs every morning.    [provider]  traMADol (ULTRAM) 50 MG tablet Take 100 mg by mouth 3  (three) times daily as needed (for pain).     [provider]  traZODone (DESYREL) 50 MG tablet Take 50-75 mg by mouth at bedtime.     [provider]  vitamin C (ASCORBIC ACID) 500 MG tablet Take 500 mg by mouth daily.    [provider]    Physical Exam: Vitals:   06/23/19 0215 06/23/19 0335 06/23/19 0545 06/23/19 0600  BP: (!) 116/53 (!) 143/79  (!) 105/49  Pulse: 99 (!) 105 96 (!) 110  Resp: (!) 21 20 (!) 22 (!) 22  Temp:      TempSrc:      SpO2: (!) 87% (!) 87% 94% 94%  Weight:      Height:        Constitutional: NAD, calm, comfortable Vitals:   06/23/19 0215 06/23/19 0335 06/23/19 0545 06/23/19 0600  BP: (!) 116/53 (!) 143/79  (!) 105/49  Pulse: 99 (!) 105 96 (!) 110  Resp: (!) 21 20 (!) 22 (!) 22  Temp:      TempSrc:      SpO2: (!) 87% (!) 87% 94% 94%  Weight:      Height:       Eyes: PERRL, lids and conjunctivae normal ENMT: Mucous membranes are moist. Posterior pharynx clear of any exudate or lesions.Normal dentition.  Neck: normal, supple, no masses, no thyromegaly Respiratory: Patient is on 2 L of oxygen with nasal cannula.  Diminished breath sounds in bilateral lower lobes.  Diffuse bilateral wheezing present on auscultation. No accessory muscle use.  Cardiovascular: Regular rate and rhythm, no murmurs / rubs / gallops. No extremity edema. 2+ pedal pulses. No carotid bruits.  Abdomen: no tenderness, no masses palpated. No hepatosplenomegaly. Bowel sounds positive.  Musculoskeletal: no clubbing / cyanosis. No joint deformity upper and lower extremities. Good ROM, no contractures. Normal muscle tone.  Skin: no rashes, lesions, ulcers. No induration Neurologic: CN 2-12 grossly intact. Sensation intact, DTR normal. Strength 5/5 in all 4.  Psychiatric: Normal judgment and insight. Alert and oriented x 3. Normal mood.   Labs on Admission: I have personally reviewed following labs and imaging studies  CBC: Recent Labs  Lab 06/22/19 2053    WBC 12.7*  HGB 15.3  HCT 47.1  MCV 100.4*  PLT 789   Basic Metabolic Panel: Recent Labs  Lab 06/22/19 2053  NA 136  K 4.3  CL 103  CO2 21*  GLUCOSE 186*  BUN 21  CREATININE 1.33*  CALCIUM 9.3   GFR: Estimated Creatinine Clearance: 83.2 mL/min (A) (by C-G formula based on SCr of 1.33 mg/dL (H)). Liver Function Tests: Recent Labs  Lab 06/22/19 2053  AST 24  ALT 30  ALKPHOS 54  BILITOT 0.6  PROT 7.1  ALBUMIN 3.8   No results for input(s): LIPASE, AMYLASE in the last 168 hours. No results for input(s): AMMONIA in the last 168 hours. Coagulation Profile: No results for input(s): INR, PROTIME in the last 168 hours. Cardiac Enzymes: No results for input(s): CKTOTAL, CKMB, CKMBINDEX, TROPONINI in the last 168 hours. BNP (last 3 results) No results for input(s): PROBNP in the last 8760 hours. HbA1C: No results for input(s): HGBA1C in the last 72 hours. CBG: No results for input(s): GLUCAP in the last 168 hours. Lipid Profile: No results for input(s): CHOL, HDL, LDLCALC, TRIG, CHOLHDL, LDLDIRECT in the last 72 hours. Thyroid Function Tests: No results for input(s): TSH, T4TOTAL, FREET4, T3FREE, THYROIDAB in the last 72 hours. Anemia Panel: No results for input(s): VITAMINB12, FOLATE, FERRITIN, TIBC, IRON, RETICCTPCT in the last 72 hours. Urine analysis:    Component Value Date/Time   COLORURINE YELLOW 11/10/2017 [place] 11/10/2017 1746   LABSPEC 1.010 11/10/2017 1746   PHURINE 7.0 11/10/2017 1746   GLUCOSEU NEGATIVE 11/10/2017 1746   HGBUR NEGATIVE 11/10/2017 1746   BILIRUBINUR NEGATIVE 11/10/2017 1746   KETONESUR NEGATIVE 11/10/2017 1746   PROTEINUR NEGATIVE 11/10/2017 1746   UROBILINOGEN 1.0 01/12/2013 0024   NITRITE NEGATIVE 11/10/2017 1746   LEUKOCYTESUR NEGATIVE 11/10/2017 1746    Radiological Exams on Admission: DG Chest 2 View  Result Date: 06/22/2019 CLINICAL DATA:  COPD exacerbation, suicidal ideation EXAM: CHEST - 2 VIEW  COMPARISON:  10/10/2018 FINDINGS: Frontal and lateral views of the chest demonstrate a stable cardiac silhouette. Extensive background emphysema with bibasilar scarring unchanged. No airspace disease, effusion, or pneumothorax. No acute bony abnormalities. IMPRESSION: 1. Severe emphysema, no acute process. Electronically Signed   By: Randa Ngo M.D.   On: 06/22/2019 21:31      Assessment/Plan Principal Problem:   COPD exacerbation (HCC) Oxygen supplementation with nasal cannula as needed. DuoNeb scheduled and as needed ordered for shortness of breath. IV azithromycin 500 mg daily because of productive cough. IV Solu-Medrol 40 mg daily. Continue to monitor  Active Problems:  Suicidal ideations Patient admits of having suicidal ideations today and he reported that he has a history of depression.  Psych consult will be ordered once the patient becomes stable.  Patient has no history of previous suicidal attempts.      Hyperglycemia Low-dose sliding scale insulin ordered. Blood glucose monitoring and hypoglycemic protocol in place. Diabetic diet    Sleep apnea  Continue CPAP in the night     DVT prophylaxis: Heparin Code Status: Full code Disposition Plan:  Consults called: Psychiatric evaluation will be ordered once patient become medically stable Admission status: Observation/telemetry   Edmonia Lynch MD [facility] Pager [phone]   If 7PM-7AM, please contact night-coverage [email or web address] Password   06/23/2019, 6:53 AM

## 2019-06-23 NOTE — ED Notes (Signed)
Gave patient a pitcher of water

## 2019-06-23 NOTE — Progress Notes (Signed)
Pt. Wears cpap at home and has his own personal mask with him. cpap was set up for pt. Pt. Able to place himself on when ready.

## 2019-06-23 NOTE — Evaluation (Signed)
Physical Therapy Evaluation Patient Details Name: Mario [name] MRN: [id number] DOB: [birth year] Today's Date: 06/23/2019   History of Present Illness  69 y.o. male with medical history significant of hypertension, COPD, depression and sleep apnea presented to ED for evaluation of shortness of breath and suicidal ideation.  Patient states that he started having shortness of breath that started yesterday and continue to worsen along with episodic cough and his condition did not improve with home inhaler and oxygenation. Pt also reports having suicidal thoughts today.  Clinical Impression  Pt presents to PT with deficits in activity tolerance, gait, and with back pain. Pt reports SOB with 4/10 BORG dyspnea rating at end of ambulation. Pt also with slowed gait speed and reduced step length. Pt reports often fatiguing and being limited by breathing or back pain, limiting his mobility to household distances only. Pt will benefit from continued acute PT POC to improve activity tolerance and reduce back pain. PT recommends HHPT if pt discharges home.    Follow Up Recommendations Home health PT;Supervision - Intermittent(possibly D/C to behavioral health)    Equipment Recommendations  None recommended by PT(pt has necessary DME)    Recommendations for Other Services       Precautions / Restrictions Precautions Precautions: Fall Restrictions Weight Bearing Restrictions: No      Mobility  Bed Mobility Overal bed mobility: Independent                Transfers Overall transfer level: Independent Equipment used: None                Ambulation/Gait Ambulation/Gait assistance: Supervision Gait Distance (Feet): 80 Feet Assistive device: Straight cane Gait Pattern/deviations: Wide base of support;Step-to pattern Gait velocity: reduced Gait velocity interpretation: 1.31 - 2.62 ft/sec, indicative of limited community ambulator General Gait Details: pt with shortened step to gait,  reduced gait speed  Stairs            Wheelchair Mobility    Modified Rankin (Stroke Patients Only)       Balance Overall balance assessment: Mild deficits observed, not formally tested                                           Pertinent Vitals/Pain Pain Assessment: Faces Faces Pain Scale: Hurts little more Pain Location: low back Pain Descriptors / Indicators: Grimacing Pain Intervention(s): Monitored during session    Home Living Family/patient expects to be discharged to:: Private residence(possibly behavioral health) Living Arrangements: Non-relatives/Friends(roommate) Available Help at Discharge: Other (Comment)(roommate PRN) Type of Home: Apartment Home Access: Stairs to enter Entrance Stairs-Rails: Right Entrance Stairs-Number of Steps: 3 Home Layout: One level Home Equipment: Walker - 2 wheels;Cane - single point      Prior Function Level of Independence: Independent with assistive device(s)         Comments: pt utilizies cane outside of apartment     Hand Dominance   Dominant Hand: Right    Extremity/Trunk Assessment   Upper Extremity Assessment Upper Extremity Assessment: Overall WFL for tasks assessed    Lower Extremity Assessment Lower Extremity Assessment: Generalized weakness    Cervical / Trunk Assessment Cervical / Trunk Assessment: Kyphotic  Communication   Communication: No difficulties  Cognition Arousal/Alertness: Awake/alert Behavior During Therapy: WFL for tasks assessed/performed Overall Cognitive Status: Within Functional Limits for tasks assessed  General Comments General comments (skin integrity, edema, etc.): pt on 3L  at rest, saturatign from 90-95% during session. Tachy up to 125 with activity (110s at rest)    Exercises     Assessment/Plan    PT Assessment Patient needs continued PT services  PT Problem List Decreased activity  tolerance;Decreased balance;Decreased strength;Decreased mobility;Cardiopulmonary status limiting activity;Pain       PT Treatment Interventions Gait training;Stair training;Functional mobility training;Therapeutic activities;Therapeutic exercise;Balance training;Neuromuscular re-education;Patient/family education    PT Goals (Current goals can be found in the Care Plan section)  Acute Rehab PT Goals Patient Stated Goal: To reduced back pain and improve activity tolerance PT Goal Formulation: With patient Time For Goal Achievement: 07/07/19 Potential to Achieve Goals: Fair Additional Goals Additional Goal #1: Pt will maintain dynamic standing balance within 10 inches of his base of support without UE support, independently.    Frequency Min 3X/week   Barriers to discharge        Co-evaluation               AM-PAC PT "6 Clicks" Mobility  Outcome Measure Help needed turning from your back to your side while in a flat bed without using bedrails?: None Help needed moving from lying on your back to sitting on the side of a flat bed without using bedrails?: None Help needed moving to and from a bed to a chair (including a wheelchair)?: None Help needed standing up from a chair using your arms (e.g., wheelchair or bedside chair)?: None Help needed to walk in hospital room?: None Help needed climbing 3-5 steps with a railing? : A Lot 6 Click Score: 22    End of Session Equipment Utilized During Treatment: Oxygen Activity Tolerance: Patient limited by pain;Patient limited by fatigue Patient left: in bed;with call bell/phone within reach Nurse Communication: Mobility status PT Visit Diagnosis: Other (comment);Pain;Other abnormalities of gait and mobility (R26.89)(cardiopulmonary endurance) Pain - Right/Left: (low back)    Time: 4503-8882 PT Time Calculation (min) (ACUTE ONLY): 24 min   Charges:   PT Evaluation $PT Eval Moderate Complexity: 1 Mod          Zenaida Niece,  PT, DPT Acute Rehabilitation Pager: [phone]   Zenaida Niece 06/23/2019, 12:35 PM

## 2019-06-24 LAB — GLUCOSE, CAPILLARY
Glucose-Capillary: 150 mg/dL — ABNORMAL HIGH (ref 70–99)
Glucose-Capillary: 278 mg/dL — ABNORMAL HIGH (ref 70–99)
Glucose-Capillary: 225 mg/dL — ABNORMAL HIGH (ref 70–99)
Glucose-Capillary: 137 mg/dL — ABNORMAL HIGH (ref 70–99)

## 2019-06-24 MED ORDER — EYE WASH OPHTH SOLN
1.0000 [drp] | OPHTHALMIC | Status: DC | PRN
  Filled 2019-06-24: qty 118

## 2019-06-24 MED ORDER — IPRATROPIUM BROMIDE 0.02 % IN SOLN
0.5000 mg | Freq: Three times a day (TID) | RESPIRATORY_TRACT | Status: DC
  Administered 2019-06-24: 0.5 mg via RESPIRATORY_TRACT
  Filled 2019-06-24: qty 2.5

## 2019-06-24 MED ORDER — FUROSEMIDE 40 MG PO TABS
40.0000 mg | ORAL_TABLET | Freq: Every day | ORAL | Status: DC
  Administered 2019-06-24 – 2019-06-25 (×2): 40 mg via ORAL
  Filled 2019-06-24 (×2): qty 1

## 2019-06-24 MED ORDER — GABAPENTIN 300 MG PO CAPS: 900.0000 mg | ORAL_CAPSULE | Freq: Every day | ORAL | Status: DC

## 2019-06-24 MED ORDER — LEVALBUTEROL HCL 0.63 MG/3ML IN NEBU
0.6300 mg | INHALATION_SOLUTION | Freq: Three times a day (TID) | RESPIRATORY_TRACT | Status: DC
  Administered 2019-06-24: 0.63 mg via RESPIRATORY_TRACT
  Filled 2019-06-24: qty 3

## 2019-06-24 NOTE — Progress Notes (Addendum)
   06/23/19 1942  Assess: MEWS Score  Temp 97.6 F (36.4 C)  BP (!) 141/78  Pulse Rate (!) 104  Resp 16  SpO2 93 %  O2 Device Nasal Cannula  O2 Flow Rate (L/min) 4 L/min  Assess: MEWS Score  MEWS Temp 0  MEWS Systolic 0  MEWS Pulse 1  MEWS RR 0  MEWS LOC 0  MEWS Score 1  MEWS Score Color Green  Assess: if the MEWS score is Yellow or Red  Were vital signs taken at a resting state? Yes  Focused Assessment Documented focused assessment  Early Detection of Sepsis Score *See Row Information* Low  MEWS guidelines implemented *See Row Information* Yes  Treat  MEWS Interventions Other (Comment) (Pt on Yellow in ED, will start Yellow MEWS per protocol.)  Take Vital Signs  Increase Vital Sign Frequency  Yellow: Q 2hr X 2 then Q 4hr X 2, if remains yellow, continue Q 4hrs  Notify: Charge Nurse/RN  Name of Charge Nurse/RN Notified [place]  Date Charge Nurse/RN Notified 06/23/19  Time Charge Nurse/RN Notified 1940  Document  Patient Outcome Other (Comment) (Pt PR trending down and RR within normal level.)   Patient Outcome: Patient's PR is trending down and RR is within normal limits.

## 2019-06-24 NOTE — Consult Note (Signed)
Telepsych Consultation   Reason for Consult:  Suicidal ideation Referring Physician: Internal medicine Location of Patient: [id number] Location of Provider: [facility]  Patient Identification: Mario [name] MRN:  [id number] Principal Diagnosis: COPD exacerbation ([place]) Diagnosis:  Principal Problem:   COPD exacerbation ([place]) Active Problems:   Depression   Hyperglycemia   Sleep apnea   Suicidal ideations   Total Time spent with patient: 15 minutes  Subjective:   Mario [name] is a 69 y.o. male was seen and evaluated via teleassessment.  Patient is endorsing suicidal ideations with plan and intent.  Reports " I have multiple plans" and  "I have a ton of muscle relaxants at home."  Although patient reports chronic suicidal ideations he states due to physical health and world issues this is causing his depression to worsen. Reported social isolation and symptoms of worries.  Denied auditory or visual hallucinations.  He reports he is followed by Mario [name] ([place]) where he is prescribed Wellbutrin, BuSpar, Cymbalta, Latuda, Trazodone, Gabapentin and Trileptal. Reported taken and tolerating medication well. Reported diagnosis of Major depressive disorder and Anxiety.  Patient reported previous inpatient admissions roughly 2 years prior states he is feeling the same as he did 2 years ago.  Patient reports he does not have any family locally.  States his family resides in [place] and [place].  States he does not have transportation to and from appointments.  Reports a good appetite.  States she is resting well throughout the night.  Discussed inpatient admission once medically cleared.  Patient was receptive to plan. Support, encouragement and reassurance was provided.  HPI:  Per admission assessment note: 69 y.o. male with medical history significant of hypertension, COPD, depression and sleep apnea presented to ED for evaluation of shortness of breath and suicidal ideation.   Patient states that he started having shortness of breath that started yesterday and continue to worsen along with episodic cough and his condition did not improve with home inhaler and oxygenation. Pt also reports having suicidal thoughts today.  Past Psychiatric History:   Risk to Self:   Risk to Others:   Prior Inpatient Therapy:   Prior Outpatient Therapy:    Past Medical History:  Past Medical History:  Diagnosis Date  . Anxiety   . Bronchitis   . COPD (chronic obstructive pulmonary disease) ([place])   . Depression   . Hypertension   . Mental disorder   . MI (myocardial infarction) ([place])    ????  . OSA (obstructive sleep apnea)   . Suicide attempt (Mario [name])   . Tension pneumothorax 06/27/2016    Past Surgical History:  Procedure Laterality Date  . CHEST TUBE INSERTION Left 06/27/2016  . cryptorchidism    . SKIN CANCER EXCISION     Family History:  Family History  Problem Relation Age of Onset  . Dementia Father    Family Psychiatric  History:  Social History:  Social History   Substance and Sexual Activity  Alcohol Use No  . Alcohol/week: 0.0 standard drinks   Comment: denies use of any drugs or alcohol     Social History   Substance and Sexual Activity  Drug Use No    Social History   Socioeconomic History  . Marital status: Single    Spouse name: Not on file  . Number of children: Not on file  . Years of education: Not on file  . Highest education level: Not on file  Occupational History  . Not on file  Tobacco Use  .  Smoking status: Current Every Day Smoker    Packs/day: 0.50    Years: 20.00    Pack years: 10.00    Types: Cigarettes  . Smokeless tobacco: Never Used  Substance and Sexual Activity  . Alcohol use: No    Alcohol/week: 0.0 standard drinks    Comment: denies use of any drugs or alcohol  . Drug use: No  . Sexual activity: Not on file  Other Topics Concern  . Not on file  Social History Narrative  . Not on file   Social  Determinants of Health   Financial Resource Strain:   . Difficulty of Paying Living Expenses:   Food Insecurity:   . Worried About Charity fundraiser in the Last Year:   . Arboriculturist in the Last Year:   Transportation Needs:   . Film/video editor (Medical):   [name] Lack of Transportation (Non-Medical):   Physical Activity:   . Days of Exercise per Week:   . Minutes of Exercise per Session:   Stress:   . Feeling of Stress :   Social Connections:   . Frequency of Communication with Friends and Family:   . Frequency of Social Gatherings with Friends and Family:   . Attends Religious Services:   . Active Member of Clubs or Organizations:   . Attends Archivist Meetings:   [name] Marital Status:    Additional Social History:    Allergies:   Allergies  Allergen Reactions  . Demerol [Meperidine] Nausea And Vomiting and Other (See Comments)    Violently sick  . Zocor [Simvastatin] Nausea And Vomiting and Other (See Comments)    Made him very jittery, also  . Beet [Beta Vulgaris] Nausea And Vomiting  . Liver Nausea And Vomiting    Labs:  Results for orders placed or performed during the [name] encounter of 06/22/19 (from the past 48 hour(s))  Comprehensive metabolic panel     Status: Abnormal   Collection Time: 06/22/19  8:53 PM  Result Value Ref Range   Sodium 136 135 - 145 mmol/L   Potassium 4.3 3.5 - 5.1 mmol/L   Chloride 103 98 - 111 mmol/L   CO2 21 (L) 22 - 32 mmol/L   Glucose, Bld 186 (H) 70 - 99 mg/dL    Comment: Glucose reference range applies only to samples taken after fasting for at least 8 hours.   BUN 21 8 - 23 mg/dL   Creatinine, Ser 1.33 (H) 0.61 - 1.24 mg/dL   Calcium 9.3 8.9 - 10.3 mg/dL   Total Protein 7.1 6.5 - 8.1 g/dL   Albumin 3.8 3.5 - 5.0 g/dL   AST 24 15 - 41 U/L   ALT 30 0 - 44 U/L   Alkaline Phosphatase 54 38 - 126 U/L   Total Bilirubin 0.6 0.3 - 1.2 mg/dL   GFR calc non Af Amer 55 (L) >60 mL/min   GFR calc Af Amer >60 >60  mL/min   Anion gap 12 5 - 15    Comment: Performed at [facility] [address], [place], [place] [address]  Ethanol     Status: None   Collection Time: 06/22/19  8:53 PM  Result Value Ref Range   Alcohol, Ethyl (B) <10 <10 mg/dL    Comment: (NOTE) Lowest detectable limit for serum alcohol is 10 mg/dL. For medical purposes only. Performed at [name], [place] [address], [place], [place] [address]   Salicylate level  Status: Abnormal   Collection Time: 06/22/19  8:53 PM  Result Value Ref Range   Salicylate Lvl <5.7 (L) 7.0 - 30.0 mg/dL    Comment: Performed at [facility] [address], [place], [place] [address]  Acetaminophen level     Status: Abnormal   Collection Time: 06/22/19  8:53 PM  Result Value Ref Range   Acetaminophen (Tylenol), Serum <10 (L) 10 - 30 ug/mL    Comment: (NOTE) Therapeutic concentrations vary significantly. A range of 10-30 ug/mL  may be an effective concentration for many patients. However, some  are best treated at concentrations outside of this range. Acetaminophen concentrations >150 ug/mL at 4 hours after ingestion  and >50 ug/mL at 12 hours after ingestion are often associated with  toxic reactions. Performed at [name], [place] [address], [place], [place] [address]   cbc     Status: Abnormal   Collection Time: 06/22/19  8:53 PM  Result Value Ref Range   WBC 12.7 (H) 4.0 - 10.5 K/uL   RBC 4.69 4.22 - 5.81 MIL/uL   Hemoglobin 15.3 13.0 - 17.0 g/dL   HCT 47.1 39.0 - 52.0 %   MCV 100.4 (H) 80.0 - 100.0 fL   MCH 32.6 26.0 - 34.0 pg   MCHC 32.5 30.0 - 36.0 g/dL   RDW 13.4 11.5 - 15.5 %   Platelets 296 150 - 400 K/uL   nRBC 0.0 0.0 - 0.2 %    Comment: Performed at [name], [place] [address] [place], [place] [address]  Rapid urine drug screen ([name] performed)     Status: None   Collection Time: 06/23/19 12:27 AM  Result Value Ref Range   Opiates NONE DETECTED NONE DETECTED   Cocaine NONE  DETECTED NONE DETECTED   Benzodiazepines NONE DETECTED NONE DETECTED   Amphetamines NONE DETECTED NONE DETECTED   Tetrahydrocannabinol NONE DETECTED NONE DETECTED   Barbiturates NONE DETECTED NONE DETECTED    Comment: (NOTE) DRUG SCREEN FOR MEDICAL PURPOSES ONLY.  IF CONFIRMATION IS NEEDED FOR ANY PURPOSE, NOTIFY LAB WITHIN 5 DAYS. LOWEST DETECTABLE LIMITS FOR URINE DRUG SCREEN Drug Class                     Cutoff (ng/mL) Amphetamine and metabolites    1000 Barbiturate and metabolites    200 Benzodiazepine                 401 Tricyclics and metabolites     300 Opiates and metabolites        300 Cocaine and metabolites        300 THC                            50 Performed at [name], [place] [address] [place], [place] [address]   Troponin I (High Sensitivity)     Status: None   Collection Time: 06/23/19 12:41 AM  Result Value Ref Range   Troponin I (High Sensitivity) 9 <18 ng/L    Comment: (NOTE) Elevated high sensitivity troponin I (hsTnI) values and significant  changes across serial measurements may suggest ACS but many other  chronic and acute conditions are known to elevate hsTnI results.  Refer to the "Links" section for chest pain algorithms and additional  guidance. Performed at [name], [place] [address], [place], [place] [address]   Respiratory Panel by RT PCR (Flu  A&B, Covid) - Nasopharyngeal Swab     Status: None   Collection Time: 06/23/19 12:41 AM   Specimen: Nasopharyngeal Swab  Result Value Ref Range   SARS Coronavirus 2 by RT PCR NEGATIVE NEGATIVE    Comment: (NOTE) SARS-CoV-2 target nucleic acids are NOT DETECTED. The SARS-CoV-2 RNA is generally detectable in upper respiratoy specimens during the acute phase of infection. The lowest concentration of SARS-CoV-2 viral copies this assay can detect is 131 copies/mL. A negative result does not preclude SARS-Cov-2 infection and should not be used as the sole basis for treatment or other  patient management decisions. A negative result may occur with  improper specimen collection/handling, submission of specimen other than nasopharyngeal swab, presence of viral mutation(s) within the areas targeted by this assay, and inadequate number of viral copies (<131 copies/mL). A negative result must be combined with clinical observations, patient history, and epidemiological information. The expected result is Negative. Fact Sheet for Patients:  PinkCheek.be Fact Sheet for Healthcare Providers:  GravelBags.it This test is not yet ap proved or cleared by the [place] FDA and  has been authorized for detection and/or diagnosis of SARS-CoV-2 by FDA under an Emergency Use Authorization (EUA). This EUA will remain  in effect (meaning this test can be used) for the duration of the COVID-19 declaration under Section 564(b)(1) of the Act, 21 U.S.C. section 360bbb-3(b)(1), unless the authorization is terminated or revoked sooner.    Influenza A by PCR NEGATIVE NEGATIVE   Influenza B by PCR NEGATIVE NEGATIVE    Comment: (NOTE) The Xpert Xpress SARS-CoV-2/FLU/RSV assay is intended as an aid in  the diagnosis of influenza from Nasopharyngeal swab specimens and  should not be used as a sole basis for treatment. Nasal washings and  aspirates are unacceptable for Xpert Xpress SARS-CoV-2/FLU/RSV  testing. Fact Sheet for Patients: PinkCheek.be Fact Sheet for Healthcare Providers: GravelBags.it This test is not yet approved or cleared by the [place] FDA and  has been authorized for detection and/or diagnosis of SARS-CoV-2 by  FDA under an Emergency Use Authorization (EUA). This EUA will remain  in effect (meaning this test can be used) for the duration of the  Covid-19 declaration under Section 564(b)(1) of the Act, 21  U.S.C. section 360bbb-3(b)(1), unless the  authorization is  terminated or revoked. Performed at [name], [place] [address], [place], [place] [address]   Troponin I (High Sensitivity)     Status: None   Collection Time: 06/23/19  2:13 AM  Result Value Ref Range   Troponin I (High Sensitivity) 7 <18 ng/L    Comment: (NOTE) Elevated high sensitivity troponin I (hsTnI) values and significant  changes across serial measurements may suggest ACS but many other  chronic and acute conditions are known to elevate hsTnI results.  Refer to the "Links" section for chest pain algorithms and additional  guidance. Performed at [name], [place] [address] [place], [place] [address]   HIV Antibody (routine testing w rflx)     Status: None   Collection Time: 06/23/19  6:54 AM  Result Value Ref Range   HIV Screen 4th Generation wRfx NON REACTIVE NON REACTIVE    Comment: Performed at [facility] [address], [place] [address]  CBC     Status: Abnormal   Collection Time: 06/23/19  6:54 AM  Result Value Ref Range   WBC 11.7 (H) 4.0 - 10.5 K/uL   RBC 4.43 4.22 - 5.81 MIL/uL  Hemoglobin 14.4 13.0 - 17.0 g/dL   HCT 44.1 39.0 - 52.0 %   MCV 99.5 80.0 - 100.0 fL   MCH 32.5 26.0 - 34.0 pg   MCHC 32.7 30.0 - 36.0 g/dL   RDW 13.3 11.5 - 15.5 %   Platelets 280 150 - 400 K/uL   nRBC 0.0 0.0 - 0.2 %    Comment: Performed at [name], [place] [address] [place], [place] [address]  Creatinine, serum     Status: Abnormal   Collection Time: 06/23/19  6:54 AM  Result Value Ref Range   Creatinine, Ser 1.37 (H) 0.61 - 1.24 mg/dL   GFR calc non Af Amer 53 (L) >60 mL/min   GFR calc Af Amer >60 >60 mL/min    Comment: Performed at [facility] [address], [place], [place] [address]  Hemoglobin A1c     Status: Abnormal   Collection Time: 06/23/19  6:54 AM  Result Value Ref Range   Hgb A1c MFr Bld 7.1 (H) 4.8 - 5.6 %    Comment: (NOTE) Pre diabetes:          5.7%-6.4% Diabetes:               >6.4% Glycemic control for   <7.0% adults with diabetes    Mean Plasma Glucose 157.07 mg/dL    Comment: Performed at [facility] [address], [place], [place] [address]  CBG monitoring, ED     Status: Abnormal   Collection Time: 06/23/19  8:21 AM  Result Value Ref Range   Glucose-Capillary 349 (H) 70 - 99 mg/dL    Comment: Glucose reference range applies only to samples taken after fasting for at least 8 hours.   Comment 1 Notify RN    Comment 2 Document in Chart   CBG monitoring, ED     Status: Abnormal   Collection Time: 06/23/19 12:09 PM  Result Value Ref Range   Glucose-Capillary 288 (H) 70 - 99 mg/dL    Comment: Glucose reference range applies only to samples taken after fasting for at least 8 hours.   Comment 1 Notify RN    Comment 2 Document in Chart   Glucose, capillary     Status: Abnormal   Collection Time: 06/23/19  5:45 PM  Result Value Ref Range   Glucose-Capillary 276 (H) 70 - 99 mg/dL    Comment: Glucose reference range applies only to samples taken after fasting for at least 8 hours.  Glucose, capillary     Status: Abnormal   Collection Time: 06/23/19  9:10 PM  Result Value Ref Range   Glucose-Capillary 159 (H) 70 - 99 mg/dL    Comment: Glucose reference range applies only to samples taken after fasting for at least 8 hours.  Glucose, capillary     Status: Abnormal   Collection Time: 06/24/19  8:11 AM  Result Value Ref Range   Glucose-Capillary 150 (H) 70 - 99 mg/dL    Comment: Glucose reference range applies only to samples taken after fasting for at least 8 hours.   Comment 1 Notify RN    Comment 2 Document in Chart   Glucose, capillary     Status: Abnormal   Collection Time: 06/24/19 12:24 PM  Result Value Ref Range   Glucose-Capillary 278 (H) 70 - 99 mg/dL    Comment: Glucose reference range applies only to samples taken after fasting for at least 8 hours.    Medications:  Current  Facility-Administered Medications  Medication Dose Route  Frequency Provider Last Rate Last Admin  . aspirin EC tablet 81 mg  81 mg Oral Daily Lavina Hamman, MD   81 mg at 06/24/19 0847  . azithromycin (ZITHROMAX) tablet 500 mg  500 mg Oral Daily Bunnie Pion Z, DO   500 mg at 06/24/19 0848  . buPROPion (WELLBUTRIN XL) 24 hr tablet 300 mg  300 mg Oral QPC breakfast Lavina Hamman, MD   300 mg at 06/24/19 0848  . busPIRone (BUSPAR) tablet 15 mg  15 mg Oral Daily Lavina Hamman, MD   15 mg at 06/24/19 0849  . cyclobenzaprine (FLEXERIL) tablet 10 mg  10 mg Oral QHS Lavina Hamman, MD   10 mg at 06/23/19 2153  . diltiazem (CARDIZEM CD) 24 hr capsule 180 mg  180 mg Oral Daily Lavina Hamman, MD   180 mg at 06/24/19 0847  . DULoxetine (CYMBALTA) DR capsule 60 mg  60 mg Oral BID Lavina Hamman, MD   60 mg at 06/24/19 4128  . eye wash ((SODIUM/POTASSIUM/SOD CHLORIDE)) ophthalmic solution 1 drop  1 drop Both Eyes PRN Lavina Hamman, MD      . furosemide (LASIX) tablet 40 mg  40 mg Oral Daily Lavina Hamman, MD   40 mg at 06/24/19 1234  . [START ON 06/25/2019] gabapentin (NEURONTIN) capsule 900 mg  900 mg Oral QHS Lavina Hamman, MD      . heparin injection 5,000 Units  5,000 Units Subcutaneous Q8H Bunnie Pion Z, DO   5,000 Units at 06/24/19 1233  . insulin aspart (novoLOG) injection 0-15 Units  0-15 Units Subcutaneous TID WC Bunnie Pion Z, DO   8 Units at 06/24/19 1233  . insulin aspart (novoLOG) injection 0-5 Units  0-5 Units Subcutaneous QHS Bunnie Pion Z, DO      . insulin aspart (novoLOG) injection 5 Units  5 Units Subcutaneous TID WC Lavina Hamman, MD   5 Units at 06/24/19 1233  . ipratropium-albuterol (DUONEB) 0.5-2.5 (3) MG/3ML nebulizer solution 3 mL  3 mL Nebulization Q4H PRN Bunnie Pion Z, DO      . lurasidone (LATUDA) tablet 40 mg  40 mg Oral QPC supper Lavina Hamman, MD   40 mg at 06/23/19 2053  . methocarbamol (ROBAXIN) tablet 500 mg  500 mg Oral TID PRN Lavina Hamman, MD      . mycophenolate (CELLCEPT) capsule 1,500 mg   1,500 mg Oral BID Lovey Newcomer T, NP   1,500 mg at 06/24/19 0850  . Oxcarbazepine (TRILEPTAL) tablet 300 mg  300 mg Oral TID Lavina Hamman, MD   300 mg at 06/23/19 2154  . pneumococcal 23 valent vaccine (PNEUMOVAX-23) injection 0.5 mL  0.5 mL Intramuscular Tomorrow-1000 Lavina Hamman, MD      . predniSONE (DELTASONE) tablet 40 mg  40 mg Oral Q breakfast Lavina Hamman, MD   40 mg at 06/24/19 0848  . sodium chloride (OCEAN) 0.65 % nasal spray 2 spray  2 spray Each Nare QID PRN Lavina Hamman, MD      . traMADol Veatrice Bourbon) tablet 50 mg  50 mg Oral TID PRN Lavina Hamman, MD      . traZODone (DESYREL) tablet 50 mg  50 mg Oral QHS Lavina Hamman, MD   50 mg at 06/23/19 2154    Musculoskeletal:  Psychiatric Specialty Exam: Physical Exam  Review of Systems  Blood pressure 138/78, pulse 95, temperature  40 F (36.7 C), temperature source Oral, resp. rate 20, height 6\' 2"  (1.88 m), weight (!) 149.7 kg, SpO2 93 %.Body mass index is 42.37 kg/m.  General Appearance: Casual and Guarded  Eye Contact:  Fair  Speech:  Clear and Coherent  Volume:  Normal  Mood:  Anxious and Depressed  Affect:  Congruent  Thought Process:  Coherent  Orientation:  Full (Time, Place, and Person)  Thought Content:  Logical  Suicidal Thoughts:  Yes.  with intent/plan  Homicidal Thoughts:  No  Memory:  Immediate;   Fair Recent;   Fair  Judgement:  Fair  Insight:  Fair  Psychomotor Activity:  Normal  Concentration:  Concentration: Fair  Recall:  [employer] of Knowledge:  Fair  Language:  Fair  Akathisia:  No  Handed:  Right  AIMS (if indicated):     Assets:  Communication Skills Desire for Improvement Social Support  ADL's:  Intact  Cognition:  WNL  Sleep:       Treatment Plan Summary: Daily contact with patient to assess and evaluate symptoms and progress in treatment and Medication management - SW to seek placement after med clearances - continue with current medications for mood stablization.     Disposition: Recommend psychiatric Inpatient admission when medically cleared.  This service was provided via telemedicine using a 2-way, interactive audio and video technology.  Names of all persons participating in this telemedicine service and their role in this encounter. Name: Maximilian Tallo  Role: Patient   Name: T.Patrizia Paule Role: NP   Name:  Nurse Tech  Role:   Name:  Role:     Derrill [place], NP 06/24/2019 3:54 PM

## 2019-06-24 NOTE — Progress Notes (Signed)
Nutrition Brief Note  Nutrition consult for pt with PMH of COPD admitted with COPD exacerbation and suicidal ideation. Psych consult pending.  Per chart review pt's weight is stable and eating 100% of meals.  Continue to encourage PO intake on modified diet.  Recommend outpatient DM education once appropriate due to psych issues.   Wt Readings from Last 15 Encounters:  06/22/19 (!) 149.7 kg  10/10/18 (!) 145.2 kg  04/30/18 (!) 149.7 kg  02/20/18 (!) 149.7 kg  11/10/17 (!) 151 kg  07/17/17 (!) 146 kg  09/17/16 (!) 141.5 kg  07/07/16 (!) 149.7 kg  10/10/15 (!) 143.4 kg  05/26/14 (!) 142.1 kg  01/08/14 136.1 kg  05/18/13 128.8 kg  01/21/13 120.5 kg  01/10/13 120.8 kg    Body mass index is 42.37 kg/m. Patient meets criteria for morbid obesity based on current BMI.   Current diet order is heart healthy/CHO modified, patient is consuming approximately 100% of meals at this time. Labs and medications reviewed.  Lab Results  Component Value Date   HGBA1C 7.1 (H) 06/23/2019    No nutrition interventions warranted at this time. If nutrition issues arise, please consult RD.   Lockie Pares., RD, LDN, CNSC See AMiON for contact information

## 2019-06-24 NOTE — Progress Notes (Signed)
[facility] Progress Note  Patient: Mario [name]    HYI:[id number]  DOA: 06/22/2019     Date of Service: the patient was seen and examined on 06/24/2019  Chief Complaint  Patient presents with  . Suicidal  . Shortness of Breath   Brief hospital course: Mario [name] is a 69 y.o. male with medical history significant of hypertension, COPD, depression and sleep apnea presented to ED for evaluation of shortness of breath and suicidal ideation.  Patient states that he started having shortness of breath that started yesterday and continue to worsen along with episodic cough and his condition did not improve with home inhaler and oxygenation.  Patient states that he had COPD exacerbation multiple times in the past.  Patient admits of having some chills but no fever along with productive cough but denies chest pain, nausea, vomiting, abdominal pain and urinary symptoms.  Patient states that he has history of depression but he suddenly started having suicidal thoughts today.  Has no history of previous suicidal attempts. Found to have mild COPD exacerbation. Appears to have chronic diastolic CHF. We have consulted psychiatry for assistance.   Currently further plan is follow psych recommendation.  Assessment and Plan: COPD exacerbation ([place]) Appears mild in nature. Patient is on home oxygen. Transitioning to oral steroids. Continue as needed nebulizers. Continue home inhalers. Continue antibiotics for a few more days.  Depression Suicidal ideation. Psychiatry consulted for inpatient psych admission. Resuming home medications.  Type 2 diabetes mellitus. Uncontrolled with hyperglycemia. No complication. Monitor for now.  Sleep apnea Continue CPAP nightly.  Sinus tachycardia. Resume Cardizem. Change albuterol to Xopenex.  Chronic diastolic CHF Add lasix   Morbid obesity  Body mass index is 42.37 kg/m.   Diet: heart carb modified diet DVT Prophylaxis: Subcutaneous  Lovenox   Advance goals of care discussion: Full code  Family Communication: no family was present at bedside, at the time of interview.   Disposition:  Status is: Inpatient  Remains inpatient appropriate because:suicidal ideations pending psych eval. If tries to leave hospital MUST BE IVC.    Dispo: The patient is from: Home              Anticipated d/c is to: [facility]              Anticipated d/c date is: 06/24/2019  Pending psych consult              Patient currently is medically stable to d/c.  Subjective: breathing is better sleeping okay no acute complains  Physical Exam: General:  alert oriented to time, place, and person.  Appear in mild distress, affect appropriate Eyes: PERRL ENT: Oral Mucosa Clear, moist  Neck: difficult to assess  JVD,  Cardiovascular: S1 and S2 Present, no Murmur,  Respiratory: good respiratory effort, Bilateral Air entry equal and Decreased, no Crackles, no wheezes Abdomen: Bowel Sound present, Soft and no tenderness,  Skin: no rash Extremities: bilateral  Pedal edema, no calf tenderness Neurologic: without any new focal findings Gait not checked due to patient safety concerns  Vitals:   06/24/19 0206 06/24/19 0609 06/24/19 0624 06/24/19 0752  BP: 128/75 (!) 154/102 (!) 151/85   Pulse: 80 89 81 87  Resp: 16 20  18   Temp: (!) 96.7 F (35.9 C) (!) 96.4 F (35.8 C)    TempSrc: Axillary Axillary    SpO2: 97% 95%  96%  Weight:      Height:        Intake/Output Summary (Last 24 hours)  at 06/24/2019 1300 Last data filed at 06/23/2019 2000 Gross per 24 hour  Intake 537 ml  Output --  Net 537 ml   Filed Weights   06/22/19 2031  Weight: (!) 149.7 kg    Data Reviewed: I have personally reviewed and interpreted daily labs, tele strips, imagings as discussed above. I reviewed all nursing notes, pharmacy notes, vitals, pertinent old records I have discussed plan of care as described above with RN and patient/family.  CBC: Recent Labs  Lab  06/22/19 2053 06/23/19 0654  WBC 12.7* 11.7*  HGB 15.3 14.4  HCT 47.1 44.1  MCV 100.4* 99.5  PLT 296 967   Basic Metabolic Panel: Recent Labs  Lab 06/22/19 2053 06/23/19 0654  NA 136  --   K 4.3  --   CL 103  --   CO2 21*  --   GLUCOSE 186*  --   BUN 21  --   CREATININE 1.33* 1.37*  CALCIUM 9.3  --     Studies: No results found.  Scheduled Meds: . aspirin EC  81 mg Oral Daily  . azithromycin  500 mg Oral Daily  . buPROPion  300 mg Oral QPC breakfast  . busPIRone  15 mg Oral Daily  . cyclobenzaprine  10 mg Oral QHS  . diltiazem  180 mg Oral Daily  . DULoxetine  60 mg Oral BID  . furosemide  40 mg Oral Daily  . gabapentin  600 mg Oral BID  . heparin  5,000 Units Subcutaneous Q8H  . insulin aspart  0-15 Units Subcutaneous TID WC  . insulin aspart  0-5 Units Subcutaneous QHS  . insulin aspart  5 Units Subcutaneous TID WC  . ipratropium  0.5 mg Nebulization TID  . levalbuterol  0.63 mg Nebulization TID  . lurasidone  40 mg Oral QPC supper  . mycophenolate  1,500 mg Oral BID  . Oxcarbazepine  300 mg Oral TID  . pneumococcal 23 valent vaccine  0.5 mL Intramuscular Tomorrow-1000  . predniSONE  40 mg Oral Q breakfast  . traZODone  50 mg Oral QHS   Continuous Infusions: PRN Meds: ipratropium-albuterol, methocarbamol, sodium chloride, traMADol  Time spent: 35 minutes  Author: [name], MD [facility] 06/24/2019 1:00 PM  To reach On-call, see care teams to locate the attending and reach out to them via [email or web address]. If 7PM-7AM, please contact night-coverage If you still have difficulty reaching the attending provider, please page the [facility] (Director on Call) for [facility] on amion for assistance.

## 2019-06-24 NOTE — Social Work (Signed)
Await psychiatry recommendations.   Westley Hummer, MSW, [place] Work

## 2019-06-24 NOTE — Social Work (Signed)
Pt recommended for psychiatry placement.  Pt will need geri-psych placement. Placement will be complicated by pt insurance coverage/mobility needs.  Westley Hummer, MSW, [place] Work

## 2019-06-24 NOTE — Consult Note (Addendum)
NP attempted assess patient, awaiting for RN to locate [facility]

## 2019-06-25 DIAGNOSIS — J439 Emphysema, unspecified: Secondary | ICD-10-CM | POA: Diagnosis not present

## 2019-06-25 LAB — COMPREHENSIVE METABOLIC PANEL
ALT: 33 U/L (ref 0–44)
AST: 27 U/L (ref 15–41)
Albumin: 3.5 g/dL (ref 3.5–5.0)
Alkaline Phosphatase: 46 U/L (ref 38–126)
Anion gap: 12 (ref 5–15)
BUN: 21 mg/dL (ref 8–23)
CO2: 24 mmol/L (ref 22–32)
Calcium: 8.9 mg/dL (ref 8.9–10.3)
Chloride: 101 mmol/L (ref 98–111)
Creatinine, Ser: 1.13 mg/dL (ref 0.61–1.24)
GFR calc Af Amer: >60 mL/min (ref >60)
GFR calc non Af Amer: >60 mL/min (ref >60)
Glucose, Bld: 140 mg/dL — ABNORMAL HIGH (ref 70–99)
Potassium: 4 mmol/L (ref 3.5–5.1)
Sodium: 137 mmol/L (ref 135–145)
Total Bilirubin: 0.5 mg/dL (ref 0.3–1.2)
Total Protein: 6.3 g/dL — ABNORMAL LOW (ref 6.5–8.1)

## 2019-06-25 LAB — MAGNESIUM: Magnesium: 2.1 mg/dL (ref 1.7–2.4)

## 2019-06-25 LAB — CBC WITH DIFFERENTIAL/PLATELET
Abs Immature Granulocytes: 0.05 10*3/uL (ref 0.00–0.07)
Basophils Absolute: 0.1 10*3/uL (ref 0.0–0.1)
Basophils Relative: 0 %
Eosinophils Absolute: 0.1 10*3/uL (ref 0.0–0.5)
Eosinophils Relative: 1 %
HCT: 43 % (ref 39.0–52.0)
Hemoglobin: 14.3 g/dL (ref 13.0–17.0)
Immature Granulocytes: 0 %
Lymphocytes Relative: 30 %
Lymphs Abs: 4 10*3/uL (ref 0.7–4.0)
MCH: 32.8 pg (ref 26.0–34.0)
MCHC: 33.3 g/dL (ref 30.0–36.0)
MCV: 98.6 fL (ref 80.0–100.0)
Monocytes Absolute: 1.1 10*3/uL — ABNORMAL HIGH (ref 0.1–1.0)
Monocytes Relative: 8 %
Neutro Abs: 8 10*3/uL — ABNORMAL HIGH (ref 1.7–7.7)
Neutrophils Relative %: 61 %
Platelets: 224 10*3/uL (ref 150–400)
RBC: 4.36 MIL/uL (ref 4.22–5.81)
RDW: 13.4 % (ref 11.5–15.5)
WBC: 13.3 10*3/uL — ABNORMAL HIGH (ref 4.0–10.5)
nRBC: 0 % (ref 0.0–0.2)

## 2019-06-25 LAB — GLUCOSE, CAPILLARY
Glucose-Capillary: 130 mg/dL — ABNORMAL HIGH (ref 70–99)
Glucose-Capillary: 181 mg/dL — ABNORMAL HIGH (ref 70–99)

## 2019-06-25 MED ORDER — PREDNISONE 20 MG PO TABS: 40.0000 mg | ORAL_TABLET | Freq: Every day | ORAL | 0 refills | Status: AC

## 2019-06-25 NOTE — Progress Notes (Signed)
Corliss Marcus to be D/C'd per MD order. Discussed with the patient and all questions fully answered. ? VSS, Skin clean, dry and intact without evidence of skin break down, no evidence of skin tears noted. ? IV catheter discontinued intact. Site without signs and symptoms of complications. Dressing and pressure applied. ? An After Visit Summary was printed and given to the patient. Patient informed where to pickup prescriptions. ? D/C education completed with patient/family including follow up instructions, medication list, d/c activities limitations if indicated, with other d/c instructions as indicated by MD - patient able to verbalize understanding, all questions fully answered.  ? Patient instructed to return to ED, call 911, or call MD for any changes in condition.  ? Patient to be escorted via [place], and D/C home via private auto.

## 2019-06-25 NOTE — TOC Initial Note (Signed)
Transition of Care [facility]) - Initial/Assessment Note    Patient Details  Name: Mario [name] MRN: [id number] Date of Birth: August 18, 1950  [facility]) CM/SW Contact:    Marilu Favre, RN Phone Number: 06/25/2019, 1:45 PM  Clinical Narrative:                 Patient from home with roommate. Psychiatry cleared patient to go home. NCM provided OP psych resources. [facility] also to schedule a [facility] up appointment and also discuss same.   Patient has home oxygen through [facility] and wears PRN. He also has shower chair, walker and cane but does not use.   Orders for home health PT/OT. Patient has used [facility] in past and would like to use again.   [facility] , he is aware. Marlin instructed NCM to fax home health orders for PT and OT and clinicals to PCP Dr Cherlynn Kaiser at [phone]. They will arrange home health with [facility] and call patient for follow up appointment. Information faxed.   Patient plans to arrange an uber home.   Expected Discharge Plan: [place] Barriers to Discharge: No Barriers Identified   Patient Goals and CMS Choice Patient states their goals for this hospitalization and ongoing recovery are:: to return to home CMS Medicare.gov Compare Post Acute Care list provided to:: Patient Choice offered to / list presented to : Patient  Expected Discharge Plan and Services Expected Discharge Plan: [place]   Discharge Planning Services: CM Consult Post Acute Care Choice: [place] arrangements for the past 2 months: Single Family Home Expected Discharge Date: 06/25/19               DME Arranged: N/A DME Agency: NA       HH Arranged: PT, OT HH Agency: [facility] (formerly [employer]) Date [place]: 06/25/19 Time [place]: 1344 Representative spoke with at [place]: [place]  Prior Living Arrangements/Services Living arrangements  for the past 2 months: [place] Lives with:: Roommate Patient language and need for interpreter reviewed:: Yes Do you feel safe going back to the place where you live?: Yes      Need for Family Participation in Patient Care: Yes (Comment) Care giver support system in place?: Yes (comment) Current home services: DME Criminal Activity/Legal Involvement Pertinent to Current Situation/Hospitalization: No - Comment as needed  Activities of Daily Living Home Assistive Devices/Equipment: Cane (specify quad or straight) ADL Screening (condition at time of admission) Patient's cognitive ability adequate to safely complete daily activities?: Yes Is the patient deaf or have difficulty hearing?: No Does the patient have difficulty seeing, even when wearing glasses/contacts?: No Does the patient have difficulty concentrating, remembering, or making decisions?: No Patient able to express need for assistance with ADLs?: Yes Does the patient have difficulty dressing or bathing?: No Independently performs ADLs?: Yes (appropriate for developmental age) Does the patient have difficulty walking or climbing stairs?: No Weakness of Legs: None Weakness of Arms/Hands: None  Permission Sought/Granted   Permission granted to share information with : No              Emotional Assessment Appearance:: Appears stated age Attitude/Demeanor/Rapport: Engaged Affect (typically observed): Accepting Orientation: : Oriented to Self, Oriented to Place, Oriented to  Time, Oriented to Situation Alcohol / Substance Use: Not Applicable Psych Involvement: No (comment)  Admission diagnosis:  COPD exacerbation (  [place]) [J44.1] Suicidal ideations [R45.851] Patient Active Problem List   Diagnosis Date Noted  . Hyperglycemia 06/23/2019  . Sleep apnea 06/23/2019  . Suicidal ideations 06/23/2019  . Major depressive disorder, recurrent episode ([place]) 10/12/2018  . 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 Cellulitis of both feet 07/16/2017  . DM2 (diabetes mellitus, type 2) ([place]) 07/16/2017  . HLA B27 (HLA B27 positive) 07/16/2017  . Acute chest pain   . Hyperkalemia   . Spontaneous pneumothorax 06/27/2016  . Hypoxia   . Pneumothorax on left   . Dyspnea 05/23/2014  . COPD exacerbation ([place]) 05/23/2014  . Malingering 01/21/2013  . Depression 01/11/2013  . Tobacco abuse 01/11/2013  . Obesity, unspecified 01/11/2013  . Chest pain 01/10/2013  . HTN (hypertension) 01/10/2013   PCP:  Clinic, [place]:   [place], [place] [phone] [place] [address] Phone: [phone] Fax: [phone]     Social Determinants of Health (SDOH) Interventions    Readmission Risk Interventions No flowsheet data found.

## 2019-06-25 NOTE — Discharge Summary (Signed)
Physician Discharge Summary  Mario [name] TIW:[id number] DOB: Jul 24, 1950 DOA: 06/22/2019  PCP: [facility], [facility]  Admit date: 06/22/2019 Discharge date: 06/25/2019  Admitted From: Home Disposition: Home  Recommendations for Outpatient Follow-up:  1. Follow up with PCP in 1-2 weeks 2. Follow-up with outpatient psychiatric provider  Home Health: PT/OT Equipment/Devices: Oxygen, CPAP available at home  Discharge Condition: Stable CODE STATUS: Full code Diet recommendation: Low-salt low-carb diet  Discharge summary: 69 year old gentleman with history of hypertension, COPD, chronic hypoxic respiratory failure on 2 L oxygen at home, sleep apnea on CPAP, history of depression who presents to the emergency room with episodic cough, shortness of breath and having suicidal thoughts with overdosing on medications but no attempt.  In the emergency room, he was found with some wheezing and shortness of breath, admitted and treated as COPD exacerbation as well as psychiatry consulted for evaluation.  COPD exacerbation: Most of the symptoms improved.  Received 3 days of azithromycin.  Currently on room air most of the time, has oxygen at home.  Treated with prednisone, will continue for 4 more days and stop.  He has adequate bronchodilator therapies at home.  Uses CPAP at night.  Suicidal ideation with underlying history of depression: He did have some suicidal thoughts when he presented to the emergency room, however since then he has been denying any suicidal homicidal ideations.  He denies any hallucinations or delusions.  Initially admitted under suicide precaution with psychiatry consultation.  Patient reported to this provider and psychiatric provider that he does not have any suicidal ideation and he would never do that.  He feels safe to go home.  He will continue current regimen including gabapentin, Wellbutrin, Latuda and Cymbalta.  Social worker to provide outpatient resources.  Patient  has safe dispo/plan.  He has physical weakness and debility, he will benefit with follow-up with PT OT at home.  No change in medications done.  He has information about crisis plan, calling 911, coming to the emergency room or calling suicide hotline.  His blood sugars were elevated with use of prednisone.  A1c 7.1.  He does have diabetes, however he does not want to start on medications.  He will watch his diet.  Discharge Diagnoses:  Principal Problem:   COPD exacerbation ([place]) Active Problems:   Depression   Hyperglycemia   Sleep apnea   Suicidal ideations    Discharge Instructions  Discharge Instructions    Call MD for:  difficulty breathing, headache or visual disturbances   Complete by: As directed    Diet - low sodium heart healthy   Complete by: As directed    Diet Carb Modified   Complete by: As directed    Increase activity slowly   Complete by: As directed      Allergies as of 06/25/2019      Reactions   Demerol [meperidine] Nausea And Vomiting, Other (See Comments)   Violently sick   Zocor [simvastatin] Nausea And Vomiting, Other (See Comments)   Made him very jittery, also   Beet [beta Vulgaris] Nausea And Vomiting   Liver Nausea And Vomiting      Medication List    STOP taking these medications   lidocaine 5 % ointment Commonly known as: XYLOCAINE   prednisoLONE acetate 1 % ophthalmic suspension Commonly known as: PRED FORTE   silver sulfADIAZINE 1 % cream Commonly known as: SILVADENE   tiotropium 18 MCG inhalation capsule Commonly known as: SPIRIVA     TAKE these medications  ARTIFICIAL TEARS PF OP Place 1-2 drops into both eyes every 2 (two) hours.   aspirin EC 81 MG tablet Take 81 mg by mouth daily.   buPROPion 300 MG 24 hr tablet Commonly known as: WELLBUTRIN XL Take 300 mg by mouth daily after breakfast.   busPIRone 15 MG tablet Commonly known as: BUSPAR Take 15 mg by mouth daily.   calcium-vitamin D 500-200 MG-UNIT  tablet Commonly known as: OSCAL WITH D Take 1 tablet by mouth 2 (two) times daily with a meal.   clobetasol ointment 0.05 % Commonly known as: TEMOVATE Apply 1 application topically See admin instructions. Apply a small amount to healed areas of legs 2 times a day as directed- avoid face and genitals   cyclobenzaprine 10 MG tablet Commonly known as: FLEXERIL Take 10 mg by mouth at bedtime.   diltiazem 180 MG 24 hr capsule Commonly known as: CARDIZEM CD Take 180 mg by mouth daily.   DULoxetine 60 MG capsule Commonly known as: CYMBALTA Take 1 capsule (60 mg total) by mouth 2 (two) times daily.   Enbrel 50 MG/ML injection Generic drug: etanercept Inject 50 mg into the skin every Wednesday.   fluocinonide cream 0.05 % Commonly known as: LIDEX Apply 1 application topically See admin instructions. Apply a small amount to affected areas 2 times a day   folic acid 1 MG tablet Commonly known as: FOLVITE Take 1 mg by mouth daily.   gabapentin 300 MG capsule Commonly known as: NEURONTIN Take 2 capsules (600 mg total) by mouth 2 (two) times daily. What changed:   how much to take  when to take this   lisinopril 20 MG tablet Commonly known as: ZESTRIL Take 1 tablet (20 mg total) by mouth daily. For high blood pressure What changed: additional instructions   lurasidone 40 MG Tabs tablet Commonly known as: LATUDA Take 40 mg by mouth daily after supper.   melatonin 3 MG Tabs tablet Take 6 mg by mouth at bedtime.   methocarbamol 500 MG tablet Commonly known as: ROBAXIN Take 500 mg by mouth 3 (three) times daily as needed (for muscular pain).   methotrexate 2.5 MG tablet Commonly known as: RHEUMATREX Take 6 tablets (15 mg total) by mouth every Saturday. Hold until seen by rheumatology  Caution:Chemotherapy. Protect from light. What changed: additional instructions   multivitamin with minerals Tabs tablet Take 1 tablet by mouth daily.   mycophenolate 250 MG  capsule Commonly known as: CELLCEPT Take 1,500 mg by mouth 2 (two) times daily.   Oxcarbazepine 300 MG tablet Commonly known as: TRILEPTAL Take 300 mg by mouth 3 (three) times daily.   OXYGEN Inhale 2 L/min into the lungs See admin instructions. 2 liters/min at bedtime and during all times of exertion   predniSONE 20 MG tablet Commonly known as: DELTASONE Take 2 tablets (40 mg total) by mouth daily with breakfast for 4 days. Start taking on: June 26, 2019   ProAir HFA 108 (90 Base) MCG/ACT inhaler Generic drug: albuterol Inhale 2 puffs into the lungs every 6 (six) hours as needed for wheezing or shortness of breath.   sodium chloride 0.65 % Soln nasal spray Commonly known as: OCEAN Place 2 sprays into both nostrils 4 (four) times daily as needed for congestion.   Tiotropium Bromide-Olodaterol 2.5-2.5 MCG/ACT Aers Inhale 2 puffs into the lungs every morning.   traMADol 50 MG tablet Commonly known as: ULTRAM Take 100 mg by mouth 3 (three) times daily as needed for moderate pain (for  pain).   traZODone 50 MG tablet Commonly known as: DESYREL Take 50-75 mg by mouth at bedtime.   vitamin C 500 MG tablet Commonly known as: ASCORBIC ACID Take 500 mg by mouth 2 (two) times daily.   Vitamin D-3 25 MCG (1000 UT) Caps Take 1,000 Units by mouth daily with breakfast.   vitamin E 180 MG (400 UNITS) capsule Generic drug: vitamin E Take 400 Units by mouth daily.       Allergies  Allergen Reactions  . Demerol [Meperidine] Nausea And Vomiting and Other (See Comments)    Violently sick  . Zocor [Simvastatin] Nausea And Vomiting and Other (See Comments)    Made him very jittery, also  . Beet [Beta Vulgaris] Nausea And Vomiting  . Liver Nausea And Vomiting    Consultations:  Psychiatry   Procedures/Studies: DG Chest 2 View  Result Date: 06/22/2019 CLINICAL DATA:  COPD exacerbation, suicidal ideation EXAM: CHEST - 2 VIEW COMPARISON:  10/10/2018 FINDINGS: Frontal and  lateral views of the chest demonstrate a stable cardiac silhouette. Extensive background emphysema with bibasilar scarring unchanged. No airspace disease, effusion, or pneumothorax. No acute bony abnormalities. IMPRESSION: 1. Severe emphysema, no acute process. Electronically Signed   By: Randa Ngo M.D.   On: 06/22/2019 21:31   Subjective: Patient seen and examined.  No overnight events.  Denies any chest pain or wheezing. We discussed about his presentation and he stated that he is ready to go home.  He stated that he will never do any harm to himself and others.  He says his negative roommate is very supportive and helps him.   Discharge Exam: Vitals:   06/24/19 2141 06/24/19 2141  BP: 115/78 115/78  Pulse: 88 88  Resp: 20 20  Temp: 98 F (36.7 C) 98 F (36.7 C)  SpO2: 93% 93%   Vitals:   06/24/19 0752 06/24/19 1350 06/24/19 2141 06/24/19 2141  BP:  138/78 115/78 115/78  Pulse: 87 95 88 88  Resp: 18 20 20 20   Temp:  98 F (36.7 C) 98 F (36.7 C) 98 F (36.7 C)  TempSrc:  Oral Oral Oral  SpO2: 96% 93% 93% 93%  Weight:      Height:        General: Pt is alert, awake, not in acute distress, sitting in the side of the bed.  On 2 L oxygen. Cardiovascular: RRR, S1/S2 +, no rubs, no gallops Respiratory: CTA bilaterally, no wheezing, no rhonchi, no added sounds Abdominal: Soft, NT, ND, bowel sounds +, obese and pendulous Extremities: no edema, no cyanosis Normal mood.  Denies any delusions or hallucinations.  Denies any suicidal homicidal ideations.    The results of significant diagnostics from this hospitalization (including imaging, microbiology, ancillary and laboratory) are listed below for reference.     Microbiology: Recent Results (from the past 240 hour(s))  Respiratory Panel by RT PCR (Flu A&B, Covid) - Nasopharyngeal Swab     Status: None   Collection Time: 06/23/19 12:41 AM   Specimen: Nasopharyngeal Swab  Result Value Ref Range Status   SARS Coronavirus 2  by RT PCR NEGATIVE NEGATIVE Final    Comment: (NOTE) SARS-CoV-2 target nucleic acids are NOT DETECTED. The SARS-CoV-2 RNA is generally detectable in upper respiratoy specimens during the acute phase of infection. The lowest concentration of SARS-CoV-2 viral copies this assay can detect is 131 copies/mL. A negative result does not preclude SARS-Cov-2 infection and should not be used as the sole basis for treatment or other  patient management decisions. A negative result may occur with  improper specimen collection/handling, submission of specimen other than nasopharyngeal swab, presence of viral mutation(s) within the areas targeted by this assay, and inadequate number of viral copies (<131 copies/mL). A negative result must be combined with clinical observations, patient history, and epidemiological information. The expected result is Negative. Fact Sheet for Patients:  PinkCheek.be Fact Sheet for Healthcare Providers:  GravelBags.it This test is not yet ap proved or cleared by the [place] FDA and  has been authorized for detection and/or diagnosis of SARS-CoV-2 by FDA under an Emergency Use Authorization (EUA). This EUA will remain  in effect (meaning this test can be used) for the duration of the COVID-19 declaration under Section 564(b)(1) of the Act, 21 U.S.C. section 360bbb-3(b)(1), unless the authorization is terminated or revoked sooner.    Influenza A by PCR NEGATIVE NEGATIVE Final   Influenza B by PCR NEGATIVE NEGATIVE Final    Comment: (NOTE) The Xpert Xpress SARS-CoV-2/FLU/RSV assay is intended as an aid in  the diagnosis of influenza from Nasopharyngeal swab specimens and  should not be used as a sole basis for treatment. Nasal washings and  aspirates are unacceptable for Xpert Xpress SARS-CoV-2/FLU/RSV  testing. Fact Sheet for Patients: PinkCheek.be Fact Sheet for Healthcare  Providers: GravelBags.it This test is not yet approved or cleared by the [place] FDA and  has been authorized for detection and/or diagnosis of SARS-CoV-2 by  FDA under an Emergency Use Authorization (EUA). This EUA will remain  in effect (meaning this test can be used) for the duration of the  Covid-19 declaration under Section 564(b)(1) of the Act, 21  U.S.C. section 360bbb-3(b)(1), unless the authorization is  terminated or revoked. Performed at [facility], [place] [address], [place], [place] [address]      Labs: BNP (last 3 results) No results for input(s): BNP in the last 8760 hours. Basic Metabolic Panel: Recent Labs  Lab 06/22/19 2053 06/23/19 0654 06/25/19 0209  NA 136  --  137  K 4.3  --  4.0  CL 103  --  101  CO2 21*  --  24  GLUCOSE 186*  --  140*  BUN 21  --  21  CREATININE 1.33* 1.37* 1.13  CALCIUM 9.3  --  8.9  MG  --   --  2.1   Liver Function Tests: Recent Labs  Lab 06/22/19 2053 06/25/19 0209  AST 24 27  ALT 30 33  ALKPHOS 54 46  BILITOT 0.6 0.5  PROT 7.1 6.3*  ALBUMIN 3.8 3.5   No results for input(s): LIPASE, AMYLASE in the last 168 hours. No results for input(s): AMMONIA in the last 168 hours. CBC: Recent Labs  Lab 06/22/19 2053 06/23/19 0654 06/25/19 0209  WBC 12.7* 11.7* 13.3*  NEUTROABS  --   --  8.0*  HGB 15.3 14.4 14.3  HCT 47.1 44.1 43.0  MCV 100.4* 99.5 98.6  PLT 296 280 224   Cardiac Enzymes: No results for input(s): CKTOTAL, CKMB, CKMBINDEX, TROPONINI in the last 168 hours. BNP: Invalid input(s): POCBNP CBG: Recent Labs  Lab 06/24/19 1224 06/24/19 1626 06/24/19 2142 06/25/19 0727 06/25/19 1203  GLUCAP 278* 225* 137* 130* 181*   D-Dimer No results for input(s): DDIMER in the last 72 hours. Hgb A1c Recent Labs    06/23/19 0654  HGBA1C 7.1*   Lipid Profile No results for input(s): CHOL, HDL, LDLCALC, TRIG, CHOLHDL, LDLDIRECT in the last 72 hours. Thyroid function  studies No results for input(s): TSH, T4TOTAL, T3FREE, THYROIDAB in the last 72 hours.  Invalid input(s): FREET3 Anemia work up No results for input(s): VITAMINB12, FOLATE, FERRITIN, TIBC, IRON, RETICCTPCT in the last 72 hours. Urinalysis    Component Value Date/Time   COLORURINE YELLOW 11/10/2017 [place] 11/10/2017 1746   LABSPEC 1.010 11/10/2017 1746   PHURINE 7.0 11/10/2017 1746   GLUCOSEU NEGATIVE 11/10/2017 1746   HGBUR NEGATIVE 11/10/2017 1746   BILIRUBINUR NEGATIVE 11/10/2017 1746   KETONESUR NEGATIVE 11/10/2017 1746   PROTEINUR NEGATIVE 11/10/2017 1746   UROBILINOGEN 1.0 01/12/2013 0024   NITRITE NEGATIVE 11/10/2017 1746   LEUKOCYTESUR NEGATIVE 11/10/2017 1746   Sepsis Labs Invalid input(s): PROCALCITONIN,  WBC,  LACTICIDVEN Microbiology Recent Results (from the past 240 hour(s))  Respiratory Panel by RT PCR (Flu A&B, Covid) - Nasopharyngeal Swab     Status: None   Collection Time: 06/23/19 12:41 AM   Specimen: Nasopharyngeal Swab  Result Value Ref Range Status   SARS Coronavirus 2 by RT PCR NEGATIVE NEGATIVE Final    Comment: (NOTE) SARS-CoV-2 target nucleic acids are NOT DETECTED. The SARS-CoV-2 RNA is generally detectable in upper respiratoy specimens during the acute phase of infection. The lowest concentration of SARS-CoV-2 viral copies this assay can detect is 131 copies/mL. A negative result does not preclude SARS-Cov-2 infection and should not be used as the sole basis for treatment or other patient management decisions. A negative result may occur with  improper specimen collection/handling, submission of specimen other than nasopharyngeal swab, presence of viral mutation(s) within the areas targeted by this assay, and inadequate number of viral copies (<131 copies/mL). A negative result must be combined with clinical observations, patient history, and epidemiological information. The expected result is Negative. Fact Sheet for Patients:   PinkCheek.be Fact Sheet for Healthcare Providers:  GravelBags.it This test is not yet ap proved or cleared by the [place] FDA and  has been authorized for detection and/or diagnosis of SARS-CoV-2 by FDA under an Emergency Use Authorization (EUA). This EUA will remain  in effect (meaning this test can be used) for the duration of the COVID-19 declaration under Section 564(b)(1) of the Act, 21 U.S.C. section 360bbb-3(b)(1), unless the authorization is terminated or revoked sooner.    Influenza A by PCR NEGATIVE NEGATIVE Final   Influenza B by PCR NEGATIVE NEGATIVE Final    Comment: (NOTE) The Xpert Xpress SARS-CoV-2/FLU/RSV assay is intended as an aid in  the diagnosis of influenza from Nasopharyngeal swab specimens and  should not be used as a sole basis for treatment. Nasal washings and  aspirates are unacceptable for Xpert Xpress SARS-CoV-2/FLU/RSV  testing. Fact Sheet for Patients: PinkCheek.be Fact Sheet for Healthcare Providers: GravelBags.it This test is not yet approved or cleared by the [place] FDA and  has been authorized for detection and/or diagnosis of SARS-CoV-2 by  FDA under an Emergency Use Authorization (EUA). This EUA will remain  in effect (meaning this test can be used) for the duration of the  Covid-19 declaration under Section 564(b)(1) of the Act, 21  U.S.C. section 360bbb-3(b)(1), unless the authorization is  terminated or revoked. Performed at [facility], [place] [address] [place], [place] [address]      Time coordinating discharge:  35 minutes  SIGNED:   Barb Merino, MD  [facility] 06/25/2019, 1:21 PM

## 2019-06-25 NOTE — Consult Note (Signed)
[facility] Face-to-Face Psychiatry Consult   Reason for Consult:  "suicidal ideation" Referring Physician:  Dr Sloan Leiter Patient Identification: Mario [name] MRN:  [id number] Principal Diagnosis: COPD exacerbation ([place]) Diagnosis:  Principal Problem:   COPD exacerbation ([place]) Active Problems:   Depression   Hyperglycemia   Sleep apnea   Suicidal ideations   Total Time spent with patient: 30 minutes  Subjective:   Mario [name] is a 69 y.o. male patient.  Patient reassessed by nurse practitioner, patient assessed on yesterday by psychiatry team. Patient states "I am ready to go home." Patient states "I said I was suicidal but I know that I would never do it, I feel all right now." Patient denies suicidal and homicidal ideations today.  Patient reports prior suicide attempts, last approximately 6 years ago.  Patient denies homicidal ideations.  Patient denies auditory visual hallucinations.  Patient denies symptoms of paranoia. Patient reports recent stressor "I do not like my current neighborhood."  Patient reports his landlord is working with him to find alternative housing.  Patient reports he lives with roommate.  Patient denies access to weapons.  Patient denies alcohol and substance use. Patient reports [facility]  Patient reports having outpatient talk therapy in the past, would like to be seen by outpatient talk therapy moving forward.  Patient reports he is stable on his current home medication regimen including gabapentin, Wellbutrin, Latuda, and Cymbalta.  HPI:   Patient admitted for shortness of breath related to COPD and suicidal ideations.  Past Psychiatric History: Depression  Risk to Self:  Denies Risk to Others:  Denies Prior Inpatient Therapy:  : Behavioral health in 2014 [facility] Out[facility]  [facility]  Past Medical History:  Past Medical History:  Diagnosis Date  . Anxiety   .  Bronchitis   . COPD (chronic obstructive pulmonary disease) ([place])   . Depression   . Hypertension   . Mental disorder   . MI (myocardial infarction) ([place])    ????  . OSA (obstructive sleep apnea)   . Suicide attempt ([place])   . Tension pneumothorax 06/27/2016    Past Surgical History:  Procedure Laterality Date  . CHEST TUBE INSERTION Left 06/27/2016  . cryptorchidism    . SKIN CANCER EXCISION     Family History:  Family History  Problem Relation Age of Onset  . Dementia Father    Family Psychiatric  History: Denies Social History:  Social History   Substance and Sexual Activity  Alcohol Use No  . Alcohol/week: 0.0 standard drinks   Comment: denies use of any drugs or alcohol     Social History   Substance and Sexual Activity  Drug Use No    Social History   Socioeconomic History  . Marital status: Single    Spouse name: Not on file  . Number of children: Not on file  . Years of education: Not on file  . Highest education level: Not on file  Occupational History  . Not on file  Tobacco Use  . Smoking status: Current Every Day Smoker    Packs/day: 0.50    Years: 20.00    Pack years: 10.00    Types: Cigarettes  . Smokeless tobacco: Never Used  Substance and Sexual Activity  . Alcohol use: No    Alcohol/week: 0.0 standard drinks    Comment: denies use of any drugs or alcohol  . Drug use: No  . Sexual activity: Not on  file  Other Topics Concern  . Not on file  Social History Narrative  . Not on file   Social Determinants of Health   Financial Resource Strain:   . Difficulty of Paying Living Expenses:   Food Insecurity:   . Worried About Charity fundraiser in the Last Year:   . Arboriculturist in the Last Year:   Transportation Needs:   . Film/video editor (Medical):   [name] Lack of Transportation (Non-Medical):   Physical Activity:   . Days of Exercise per Week:   . Minutes of Exercise per Session:   Stress:   . Feeling of Stress :   Social  Connections:   . Frequency of Communication with Friends and Family:   . Frequency of Social Gatherings with Friends and Family:   . Attends Religious Services:   . Active Member of Clubs or Organizations:   . Attends Archivist Meetings:   [name] Marital Status:    Additional Social History:    Allergies:   Allergies  Allergen Reactions  . Demerol [Meperidine] Nausea And Vomiting and Other (See Comments)    Violently sick  . Zocor [Simvastatin] Nausea And Vomiting and Other (See Comments)    Made him very jittery, also  . Beet [Beta Vulgaris] Nausea And Vomiting  . Liver Nausea And Vomiting    Labs:  Results for orders placed or performed during the hospital encounter of 06/22/19 (from the past 48 hour(s))  Glucose, capillary     Status: Abnormal   Collection Time: 06/23/19  5:45 PM  Result Value Ref Range   Glucose-Capillary 276 (H) 70 - 99 mg/dL    Comment: Glucose reference range applies only to samples taken after fasting for at least 8 hours.  Glucose, capillary     Status: Abnormal   Collection Time: 06/23/19  9:10 PM  Result Value Ref Range   Glucose-Capillary 159 (H) 70 - 99 mg/dL    Comment: Glucose reference range applies only to samples taken after fasting for at least 8 hours.  Glucose, capillary     Status: Abnormal   Collection Time: 06/24/19  8:11 AM  Result Value Ref Range   Glucose-Capillary 150 (H) 70 - 99 mg/dL    Comment: Glucose reference range applies only to samples taken after fasting for at least 8 hours.   Comment 1 Notify RN    Comment 2 Document in Chart   Glucose, capillary     Status: Abnormal   Collection Time: 06/24/19 12:24 PM  Result Value Ref Range   Glucose-Capillary 278 (H) 70 - 99 mg/dL    Comment: Glucose reference range applies only to samples taken after fasting for at least 8 hours.  Glucose, capillary     Status: Abnormal   Collection Time: 06/24/19  4:26 PM  Result Value Ref Range   Glucose-Capillary 225 (H) 70 - 99  mg/dL    Comment: Glucose reference range applies only to samples taken after fasting for at least 8 hours.  Glucose, capillary     Status: Abnormal   Collection Time: 06/24/19  9:42 PM  Result Value Ref Range   Glucose-Capillary 137 (H) 70 - 99 mg/dL    Comment: Glucose reference range applies only to samples taken after fasting for at least 8 hours.  CBC with Differential/Platelet     Status: Abnormal   Collection Time: 06/25/19  2:09 AM  Result Value Ref Range   WBC 13.3 (H) 4.0 -  10.5 K/uL   RBC 4.36 4.22 - 5.81 MIL/uL   Hemoglobin 14.3 13.0 - 17.0 g/dL   HCT 43.0 39.0 - 52.0 %   MCV 98.6 80.0 - 100.0 fL   MCH 32.8 26.0 - 34.0 pg   MCHC 33.3 30.0 - 36.0 g/dL   RDW 13.4 11.5 - 15.5 %   Platelets 224 150 - 400 K/uL   nRBC 0.0 0.0 - 0.2 %   Neutrophils Relative % 61 %   Neutro Abs 8.0 (H) 1.7 - 7.7 K/uL   Lymphocytes Relative 30 %   Lymphs Abs 4.0 0.7 - 4.0 K/uL   Monocytes Relative 8 %   Monocytes Absolute 1.1 (H) 0.1 - 1.0 K/uL   Eosinophils Relative 1 %   Eosinophils Absolute 0.1 0.0 - 0.5 K/uL   Basophils Relative 0 %   Basophils Absolute 0.1 0.0 - 0.1 K/uL   Immature Granulocytes 0 %   Abs Immature Granulocytes 0.05 0.00 - 0.07 K/uL    Comment: Performed at [facility] [address] [place], [place] [address]  Comprehensive metabolic panel     Status: Abnormal   Collection Time: 06/25/19  2:09 AM  Result Value Ref Range   Sodium 137 135 - 145 mmol/L   Potassium 4.0 3.5 - 5.1 mmol/L   Chloride 101 98 - 111 mmol/L   CO2 24 22 - 32 mmol/L   Glucose, Bld 140 (H) 70 - 99 mg/dL    Comment: Glucose reference range applies only to samples taken after fasting for at least 8 hours.   BUN 21 8 - 23 mg/dL   Creatinine, Ser 1.13 0.61 - 1.24 mg/dL   Calcium 8.9 8.9 - 10.3 mg/dL   Total Protein 6.3 (L) 6.5 - 8.1 g/dL   Albumin 3.5 3.5 - 5.0 g/dL   AST 27 15 - 41 U/L   ALT 33 0 - 44 U/L   Alkaline Phosphatase 46 38 - 126 U/L   Total Bilirubin 0.5 0.3 - 1.2 mg/dL    GFR calc non Af Amer >60 >60 mL/min   GFR calc Af Amer >60 >60 mL/min   Anion gap 12 5 - 15    Comment: Performed at [facility], [place] [address] [place], [place] [address]  Magnesium     Status: None   Collection Time: 06/25/19  2:09 AM  Result Value Ref Range   Magnesium 2.1 1.7 - 2.4 mg/dL    Comment: Performed at [facility] [address], [place], [place] [address]  Glucose, capillary     Status: Abnormal   Collection Time: 06/25/19  7:27 AM  Result Value Ref Range   Glucose-Capillary 130 (H) 70 - 99 mg/dL    Comment: Glucose reference range applies only to samples taken after fasting for at least 8 hours.  Glucose, capillary     Status: Abnormal   Collection Time: 06/25/19 12:03 PM  Result Value Ref Range   Glucose-Capillary 181 (H) 70 - 99 mg/dL    Comment: Glucose reference range applies only to samples taken after fasting for at least 8 hours.    Current Facility-Administered Medications  Medication Dose Route Frequency Provider Last Rate Last Admin  . aspirin EC tablet 81 mg  81 mg Oral Daily Lavina Hamman, MD   81 mg at 06/25/19 0834  . azithromycin (ZITHROMAX) tablet 500 mg  500 mg Oral Daily Bunnie Pion Z, DO   500 mg at 06/25/19 8315  .  buPROPion (WELLBUTRIN XL) 24 hr tablet 300 mg  300 mg Oral QPC breakfast Lavina Hamman, MD   300 mg at 06/25/19 0539  . busPIRone (BUSPAR) tablet 15 mg  15 mg Oral Daily Lavina Hamman, MD   15 mg at 06/25/19 7673  . cyclobenzaprine (FLEXERIL) tablet 10 mg  10 mg Oral QHS Lavina Hamman, MD   10 mg at 06/24/19 2155  . diltiazem (CARDIZEM CD) 24 hr capsule 180 mg  180 mg Oral Daily Lavina Hamman, MD   180 mg at 06/25/19 4193  . DULoxetine (CYMBALTA) DR capsule 60 mg  60 mg Oral BID Lavina Hamman, MD   60 mg at 06/25/19 7902  . eye wash ((SODIUM/POTASSIUM/SOD CHLORIDE)) ophthalmic solution 1 drop  1 drop Both Eyes PRN Lavina Hamman, MD      . furosemide (LASIX) tablet 40 mg  40 mg Oral Daily Lavina Hamman, MD    40 mg at 06/25/19 4097  . gabapentin (NEURONTIN) capsule 900 mg  900 mg Oral QHS Lavina Hamman, MD      . heparin injection 5,000 Units  5,000 Units Subcutaneous Q8H Bunnie Pion Z, [place]   5,000 Units at 06/25/19 [phone]  . insulin aspart (novoLOG) injection 0-15 Units  0-15 Units Subcutaneous TID WC Bunnie Pion Z, DO   3 Units at 06/25/19 1235  . insulin aspart (novoLOG) injection 0-5 Units  0-5 Units Subcutaneous QHS Bunnie Pion Z, DO      . insulin aspart (novoLOG) injection 5 Units  5 Units Subcutaneous TID WC Lavina Hamman, MD   5 Units at 06/25/19 1235  . ipratropium-albuterol (DUONEB) 0.5-2.5 (3) MG/3ML nebulizer solution 3 mL  3 mL Nebulization Q4H PRN Bunnie Pion Z, DO      . lurasidone (LATUDA) tablet 40 mg  40 mg Oral QPC supper Lavina Hamman, MD   40 mg at 06/24/19 1650  . methocarbamol (ROBAXIN) tablet 500 mg  500 mg Oral TID PRN Lavina Hamman, MD      . mycophenolate (CELLCEPT) capsule 1,500 mg  1,500 mg Oral BID Lovey Newcomer T, NP   1,500 mg at 06/25/19 9924  . Oxcarbazepine (TRILEPTAL) tablet 300 mg  300 mg Oral TID Lavina Hamman, MD   300 mg at 06/25/19 2683  . predniSONE (DELTASONE) tablet 40 mg  40 mg Oral Q breakfast Lavina Hamman, MD   40 mg at 06/25/19 4196  . sodium chloride (OCEAN) 0.65 % nasal spray 2 spray  2 spray Each Nare QID PRN Lavina Hamman, MD      . traMADol Veatrice Bourbon) tablet 50 mg  50 mg Oral TID PRN Lavina Hamman, MD   50 mg at 06/25/19 1131  . traZODone (DESYREL) tablet 50 mg  50 mg Oral QHS Lavina Hamman, MD   50 mg at 06/24/19 2154    Musculoskeletal: Strength & Muscle Tone: within normal limits Gait & Station: normal Patient leans: N/A  Psychiatric Specialty Exam: Physical Exam  Review of Systems  Blood pressure 115/78, pulse 88, temperature 98 F (36.7 C), temperature source Oral, resp. rate 20, height 6\' 2"  (1.88 m), weight (!) 149.7 kg, SpO2 93 %.Body mass index is 42.37 kg/m.  General Appearance: Casual and Fairly  Groomed  Eye Contact:  Good  Speech:  Clear and Coherent and Normal Rate  Volume:  Normal  Mood:  Euthymic  Affect:  Appropriate and Congruent  Thought Process:  Coherent, Goal Directed  and Descriptions of Associations: Intact  Orientation:  Full (Time, Place, and Person)  Thought Content:  Logical  Suicidal Thoughts:  No  Homicidal Thoughts:  No  Memory:  Immediate;   Good Recent;   Good Remote;   Good  Judgement:  Good  Insight:  Good  Psychomotor Activity:  Normal  Concentration:  Concentration: Good and Attention Span: Good  Recall:  Good  Fund of Knowledge:  Good  Language:  Good  Akathisia:  No  Handed:  Right  AIMS (if indicated):     Assets:  Communication Skills Desire for Improvement Financial Resources/Insurance Housing Intimacy Leisure Time Physical Health Resilience Social Support Talents/Skills  ADL's:  Intact  Cognition:  WNL  Sleep:        Treatment Plan Summary: Patient discussed Dr. Dwyane Dee. Social work to assist with resources for outpatient talk therapist. Plan Follow-up with established outpatient psychiatry.  Continue home medications as ordered.  Disposition: No evidence of imminent risk to self or others at present.   Patient does not meet criteria for psychiatric inpatient admission. Supportive therapy provided about ongoing stressors. Discussed crisis plan, support from social network, calling 911, coming to the Emergency Department, and calling Suicide Hotline.  Emmaline Kluver, FNP 06/25/2019 12:52 PM

## 2019-06-25 NOTE — Progress Notes (Addendum)
Physical Therapy Treatment Patient Details Name: Mario [name] MRN: [id number] DOB: 03-Mar-1950 Today's Date: 06/25/2019    History of Present Illness 69 y.o. male with medical history significant of hypertension, COPD, depression and sleep apnea presented to ED for evaluation of shortness of breath and suicidal ideation.  Patient states that he started having shortness of breath that started yesterday and continue to worsen along with episodic cough and his condition did not improve with home inhaler and oxygenation. Pt also reported having suicidal thoughts.    PT Comments    Pt demonstrating transfers independently and slight improvement in gait tolerance.  Pt was limited due to back pain and fatigue.  Declined further activity after ambulation.    Follow Up Recommendations  Home health PT;Supervision - Intermittent    Equipment Recommendations  None recommended by PT    Recommendations for Other Services       Precautions / Restrictions Precautions Precautions: Fall    Mobility  Bed Mobility Overal bed mobility: Independent                Transfers Overall transfer level: Independent Equipment used: None                Ambulation/Gait Ambulation/Gait assistance: Supervision Gait Distance (Feet): 90 Feet Assistive device: Straight cane Gait Pattern/deviations: Wide base of support;Step-to pattern Gait velocity: reduced   General Gait Details: Step to R pattern with R toe out; fatigued easily and limited by back pain; on 2 LPM O2 with sats 93%   Stairs             Wheelchair Mobility    Modified Rankin (Stroke Patients Only)       Balance Overall balance assessment: Needs assistance   Sitting balance-Leahy Scale: Normal     Standing balance support: No upper extremity supported;During functional activity Standing balance-Leahy Scale: Good Standing balance comment: Pt able to reach ~5-8 inches outside BOS to get his cane from closet                             Cognition Arousal/Alertness: Awake/alert Behavior During Therapy: WFL for tasks assessed/performed Overall Cognitive Status: Within Functional Limits for tasks assessed                                        Exercises      General Comments General comments (skin integrity, edema, etc.): Pt on 2 L [place] with sats 93-95%; HR 100 bpm rest and up to 116 with activity      Pertinent Vitals/Pain Pain Assessment: Faces Faces Pain Scale: Hurts even more Pain Location: low back-chronic Pain Descriptors / Indicators: Grimacing Pain Intervention(s): Monitored during session;Relaxation;Repositioned    Home Living                      Prior Function            PT Goals (current goals can now be found in the care plan section) Acute Rehab PT Goals Patient Stated Goal: To reduced back pain and improve activity tolerance PT Goal Formulation: With patient Time For Goal Achievement: 07/07/19 Potential to Achieve Goals: Fair Progress towards PT goals: Progressing toward goals    Frequency    Min 3X/week      PT Plan Current plan remains appropriate    Co-evaluation  AM-PAC PT "6 Clicks" Mobility   Outcome Measure  Help needed turning from your back to your side while in a flat bed without using bedrails?: None Help needed moving from lying on your back to sitting on the side of a flat bed without using bedrails?: None Help needed moving to and from a bed to a chair (including a wheelchair)?: None Help needed standing up from a chair using your arms (e.g., wheelchair or bedside chair)?: None Help needed to walk in hospital room?: None Help needed climbing 3-5 steps with a railing? : A Little 6 Click Score: 23    End of Session Equipment Utilized During Treatment: Oxygen Activity Tolerance: Patient limited by pain;Patient limited by fatigue Patient left: in bed;with call bell/phone within reach;with  nursing/sitter in room Nurse Communication: Mobility status PT Visit Diagnosis: Other (comment);Pain;Other abnormalities of gait and mobility (R26.89) Pain - part of body: (back)     Time: 0122-2411 PT Time Calculation (min) (ACUTE ONLY): 15 min  Charges:  $Gait Training: 8-22 mins                     Maggie Font, PT Acute Rehab Services Pager [phone] [facility] [place] 06/25/2019, 12:23 PM

## 2019-07-02 ENCOUNTER — Emergency Department (HOSPITAL_COMMUNITY): Payer: No Typology Code available for payment source

## 2019-07-02 ENCOUNTER — Observation Stay (HOSPITAL_COMMUNITY)
Admission: EM | Admit: 2019-07-02 | Discharge: 2019-07-03 | Disposition: A | Discharge status: Home / Self Care | Payer: No Typology Code available for payment source | Attending: Family Medicine | Admitting: Family Medicine

## 2019-07-02 ENCOUNTER — Encounter (HOSPITAL_COMMUNITY): Payer: Self-pay | Admitting: Emergency Medicine

## 2019-07-02 ENCOUNTER — Other Ambulatory Visit: Payer: Self-pay

## 2019-07-02 DIAGNOSIS — Z79899 Other long term (current) drug therapy: Secondary | ICD-10-CM | POA: Insufficient documentation

## 2019-07-02 DIAGNOSIS — I119 Hypertensive heart disease without heart failure: Secondary | ICD-10-CM | POA: Insufficient documentation

## 2019-07-02 DIAGNOSIS — Z885 Allergy status to narcotic agent status: Secondary | ICD-10-CM | POA: Insufficient documentation

## 2019-07-02 DIAGNOSIS — J439 Emphysema, unspecified: Secondary | ICD-10-CM | POA: Diagnosis not present

## 2019-07-02 DIAGNOSIS — Z915 Personal history of self-harm: Secondary | ICD-10-CM | POA: Diagnosis not present

## 2019-07-02 DIAGNOSIS — F17210 Nicotine dependence, cigarettes, uncomplicated: Secondary | ICD-10-CM | POA: Insufficient documentation

## 2019-07-02 DIAGNOSIS — E1159 Type 2 diabetes mellitus with other circulatory complications: Secondary | ICD-10-CM | POA: Diagnosis present

## 2019-07-02 DIAGNOSIS — F329 Major depressive disorder, single episode, unspecified: Secondary | ICD-10-CM | POA: Insufficient documentation

## 2019-07-02 DIAGNOSIS — N179 Acute kidney failure, unspecified: Secondary | ICD-10-CM | POA: Diagnosis not present

## 2019-07-02 DIAGNOSIS — R739 Hyperglycemia, unspecified: Secondary | ICD-10-CM

## 2019-07-02 DIAGNOSIS — G4733 Obstructive sleep apnea (adult) (pediatric): Secondary | ICD-10-CM | POA: Diagnosis not present

## 2019-07-02 DIAGNOSIS — Z9981 Dependence on supplemental oxygen: Secondary | ICD-10-CM | POA: Insufficient documentation

## 2019-07-02 DIAGNOSIS — I10 Essential (primary) hypertension: Secondary | ICD-10-CM | POA: Diagnosis present

## 2019-07-02 DIAGNOSIS — I252 Old myocardial infarction: Secondary | ICD-10-CM | POA: Insufficient documentation

## 2019-07-02 DIAGNOSIS — E1165 Type 2 diabetes mellitus with hyperglycemia: Secondary | ICD-10-CM | POA: Diagnosis not present

## 2019-07-02 DIAGNOSIS — R079 Chest pain, unspecified: Principal | ICD-10-CM | POA: Diagnosis present

## 2019-07-02 DIAGNOSIS — F419 Anxiety disorder, unspecified: Secondary | ICD-10-CM | POA: Diagnosis not present

## 2019-07-02 DIAGNOSIS — Z85828 Personal history of other malignant neoplasm of skin: Secondary | ICD-10-CM | POA: Insufficient documentation

## 2019-07-02 DIAGNOSIS — F32A Depression, unspecified: Secondary | ICD-10-CM | POA: Diagnosis present

## 2019-07-02 DIAGNOSIS — Z20822 Contact with and (suspected) exposure to covid-19: Secondary | ICD-10-CM | POA: Diagnosis not present

## 2019-07-02 DIAGNOSIS — Z7982 Long term (current) use of aspirin: Secondary | ICD-10-CM | POA: Insufficient documentation

## 2019-07-02 DIAGNOSIS — E119 Type 2 diabetes mellitus without complications: Secondary | ICD-10-CM

## 2019-07-02 DIAGNOSIS — Z888 Allergy status to other drugs, medicaments and biological substances status: Secondary | ICD-10-CM | POA: Insufficient documentation

## 2019-07-02 DIAGNOSIS — Z6841 Body Mass Index (BMI) 40.0 and over, adult: Secondary | ICD-10-CM | POA: Insufficient documentation

## 2019-07-02 DIAGNOSIS — J449 Chronic obstructive pulmonary disease, unspecified: Secondary | ICD-10-CM | POA: Diagnosis present

## 2019-07-02 LAB — CBC
HCT: 48.3 % (ref 39.0–52.0)
Hemoglobin: 15.9 g/dL (ref 13.0–17.0)
MCH: 33.3 pg (ref 26.0–34.0)
MCHC: 32.9 g/dL (ref 30.0–36.0)
MCV: 101 fL — ABNORMAL HIGH (ref 80.0–100.0)
Platelets: 354 10*3/uL (ref 150–400)
RBC: 4.78 MIL/uL (ref 4.22–5.81)
RDW: 13.2 % (ref 11.5–15.5)
WBC: 14 10*3/uL — ABNORMAL HIGH (ref 4.0–10.5)
nRBC: 0 % (ref 0.0–0.2)

## 2019-07-02 LAB — BASIC METABOLIC PANEL
Anion gap: 16 — ABNORMAL HIGH (ref 5–15)
BUN: 20 mg/dL (ref 8–23)
CO2: 22 mmol/L (ref 22–32)
Calcium: 9.3 mg/dL (ref 8.9–10.3)
Chloride: 98 mmol/L (ref 98–111)
Creatinine, Ser: 1.65 mg/dL — ABNORMAL HIGH (ref 0.61–1.24)
GFR calc Af Amer: 49 mL/min — ABNORMAL LOW (ref >60)
GFR calc non Af Amer: 42 mL/min — ABNORMAL LOW (ref >60)
Glucose, Bld: 222 mg/dL — ABNORMAL HIGH (ref 70–99)
Potassium: 4.8 mmol/L (ref 3.5–5.1)
Sodium: 136 mmol/L (ref 135–145)

## 2019-07-02 LAB — TROPONIN I (HIGH SENSITIVITY)
Troponin I (High Sensitivity): 7 ng/L (ref <18)
Troponin I (High Sensitivity): 7 ng/L (ref <18)

## 2019-07-02 MED ORDER — SODIUM CHLORIDE 0.9% FLUSH
3.0000 mL | Freq: Once | INTRAVENOUS | Status: AC
  Administered 2019-07-02: 3 mL via INTRAVENOUS

## 2019-07-02 MED ORDER — ONDANSETRON 4 MG PO TBDP
8.0000 mg | ORAL_TABLET | Freq: Once | ORAL | Status: AC
  Administered 2019-07-02: 8 mg via ORAL
  Filled 2019-07-02: qty 2

## 2019-07-02 MED ORDER — NITROGLYCERIN 0.4 MG SL SUBL
0.4000 mg | SUBLINGUAL_TABLET | SUBLINGUAL | Status: DC | PRN
  Administered 2019-07-02: 0.4 mg via SUBLINGUAL
  Filled 2019-07-02: qty 1

## 2019-07-02 NOTE — ED Triage Notes (Signed)
Pt arrives via [facility] for new onset CP that radiates to neck started about 45 minutes ago- given 324 mg asa and 1 sl nitro.

## 2019-07-02 NOTE — ED Notes (Signed)
Pt pain improved from 4 to 2 after 1 nitro. Pt now complaining of nausea, PA notified.

## 2019-07-02 NOTE — ED Provider Notes (Signed)
[facility] Provider Note   CSN: [id number] Arrival date & time: 07/02/19  3664     History Chief Complaint  Patient presents with  . Chest Pain    Render Marley is a 69 y.o. male with COPD on 2L of O2, HTN, hx of vasculitis who presents with chest pain. He states that around 4:30pm when he was lying down he had an acute onset of severe central chest pressure that radiated to his neck with associated diaphoresis and nausea. After about 30 min he called EMS. He took a full dose of ASA and they gave him nitro and it improved the pain to a 4/10. He reports hx of cardiac cath in 2016 which was done at the [facility], but per his report no PCI or stenting done. He was recently admitted for COPD exacerbation last week and finished course of steroids.   HPI     Past Medical History:  Diagnosis Date  . Anxiety   . Bronchitis   . COPD (chronic obstructive pulmonary disease) ([place])   . Depression   . Hypertension   . Mental disorder   . MI (myocardial infarction) ([place])    ????  . OSA (obstructive sleep apnea)   . Suicide attempt ([place])   . Tension pneumothorax 06/27/2016    Patient Active Problem List   Diagnosis Date Noted  . Hyperglycemia 06/23/2019  . Sleep apnea 06/23/2019  . Suicidal ideations 06/23/2019  . Major depressive disorder, recurrent episode ([place]) 10/12/2018  . 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 Cellulitis of both feet 07/16/2017  . DM2 (diabetes mellitus, type 2) ([place]) 07/16/2017  . HLA B27 (HLA B27 positive) 07/16/2017  . Acute chest pain   . Hyperkalemia   . Spontaneous pneumothorax 06/27/2016  . Hypoxia   . Pneumothorax on left   . Dyspnea 05/23/2014  . COPD exacerbation ([place]) 05/23/2014  . Malingering 01/21/2013  . Depression 01/11/2013  . Tobacco abuse 01/11/2013  . Obesity, unspecified 01/11/2013  . Chest pain 01/10/2013  . HTN (hypertension) 01/10/2013    Past  Surgical History:  Procedure Laterality Date  . CHEST TUBE INSERTION Left 06/27/2016  . cryptorchidism    . SKIN CANCER EXCISION         Family History  Problem Relation Age of Onset  . Dementia Father     Social History   Tobacco Use  . Smoking status: Current Every Day Smoker    Packs/day: 0.50    Years: 20.00    Pack years: 10.00    Types: Cigarettes  . Smokeless tobacco: Never Used  Substance Use Topics  . Alcohol use: No    Alcohol/week: 0.0 standard drinks    Comment: denies use of any drugs or alcohol  . Drug use: No    Home Medications Prior to Admission medications   Medication Sig Start Date End Date Taking? Authorizing Provider  albuterol (PROAIR HFA) 108 (90 Base) MCG/ACT inhaler Inhale 2 puffs into the lungs every 6 (six) hours as needed for wheezing or shortness of breath.   Yes [provider]  aspirin EC 81 MG tablet Take 81 mg by mouth daily.   Yes [provider]  buPROPion (WELLBUTRIN XL) 300 MG 24 hr tablet Take 300 mg by mouth daily after breakfast.    Yes [provider]  busPIRone (BUSPAR) 15 MG tablet Take 15 mg by mouth daily.    Yes [provider]  calcium-vitamin  D (OSCAL WITH D) 500-200 MG-UNIT tablet Take 1 tablet by mouth 2 (two) times daily with a meal.   Yes [provider]  Cholecalciferol (VITAMIN D-3) 25 MCG (1000 UT) CAPS Take 1,000 Units by mouth daily with breakfast.   Yes [provider]  clobetasol ointment (TEMOVATE) 5.95 % Apply 1 application topically See admin instructions. Apply a small amount to healed areas of legs 2 times a day as directed- avoid face and genitals   Yes [provider]  cyclobenzaprine (FLEXERIL) 10 MG tablet Take 10 mg by mouth at bedtime.   Yes [provider]  Dextran 70-Hypromellose (ARTIFICIAL TEARS PF OP) Place 1-2 drops into both eyes every 2 (two) hours as needed (for dryness).    Yes [provider]  diltiazem (CARDIZEM  CD) 180 MG 24 hr capsule Take 180 mg by mouth daily.    [provider]  DULoxetine (CYMBALTA) 60 MG capsule Take 1 capsule (60 mg total) by mouth 2 (two) times daily. Patient taking differently: Take 60 mg by mouth 2 (two) times daily.  07/21/17   Lavina Hamman, MD  etanercept (ENBREL) 50 MG/ML injection Inject 50 mg into the skin every Wednesday.     [provider]  fluocinonide cream (LIDEX) 6.38 % Apply 1 application topically See admin instructions. Apply a small amount to affected areas 2 times a day    [provider]  folic acid (FOLVITE) 1 MG tablet Take 1 mg by mouth daily.    [provider]  gabapentin (NEURONTIN) 300 MG capsule Take 2 capsules (600 mg total) by mouth 2 (two) times daily. Patient taking differently: Take 900 mg by mouth at bedtime.  07/06/16   Nani Skillern, PA-C  lisinopril (PRINIVIL,ZESTRIL) 20 MG tablet Take 1 tablet (20 mg total) by mouth daily. For high blood pressure Patient taking differently: Take 20 mg by mouth daily.  01/21/13   Nena Polio T, PA-C  lurasidone (LATUDA) 40 MG TABS tablet Take 40 mg by mouth daily after supper.    [provider]  Melatonin 3 MG TABS Take 6 mg by mouth at bedtime.    [provider]  methocarbamol (ROBAXIN) 500 MG tablet Take 500 mg by mouth 3 (three) times daily as needed (for muscular pain).    [provider]  methotrexate (RHEUMATREX) 2.5 MG tablet Take 6 tablets (15 mg total) by mouth every Saturday. Hold until seen by rheumatology  Caution:Chemotherapy. Protect from light. Patient taking differently: Take 15 mg by mouth every Saturday. Caution:Chemotherapy. Protect from light. 07/22/17   Lavina Hamman, MD  Multiple Vitamin (MULTIVITAMIN WITH MINERALS) TABS tablet Take 1 tablet by mouth daily.    [provider]  mycophenolate (CELLCEPT) 250 MG capsule Take 1,500 mg by mouth 2 (two) times daily.    [provider]  Oxcarbazepine  (TRILEPTAL) 300 MG tablet Take 300 mg by mouth 3 (three) times daily.    [provider]  OXYGEN Inhale 2 L/min into the lungs See admin instructions. 2 liters/min at bedtime and during all times of exertion    [provider]  sodium chloride (OCEAN) 0.65 % SOLN nasal spray Place 2 sprays into both nostrils 4 (four) times daily as needed for congestion.    [provider]  Tiotropium Bromide-Olodaterol 2.5-2.5 MCG/ACT AERS Inhale 2 puffs into the lungs every morning.    [provider]  traMADol (ULTRAM) 50 MG tablet Take 100 mg by mouth 3 (three) times  daily as needed for moderate pain (for pain).     [provider]  traZODone (DESYREL) 50 MG tablet Take 50-75 mg by mouth at bedtime.     [provider]  vitamin C (ASCORBIC ACID) 500 MG tablet Take 500 mg by mouth 2 (two) times daily.     [provider]  vitamin E (VITAMIN E) 180 MG (400 UNITS) capsule Take 400 Units by mouth daily.    [provider]    Allergies    Demerol [meperidine], Zocor [simvastatin], Beet [beta vulgaris], and Liver  Review of Systems   Review of Systems  Constitutional: Positive for diaphoresis. Negative for chills and fever.  Respiratory: Positive for shortness of breath. Negative for cough and wheezing.   Cardiovascular: Positive for chest pain. Negative for palpitations.  Gastrointestinal: Positive for nausea. Negative for abdominal pain and vomiting.  All other systems reviewed and are negative.   Physical Exam Updated Vital Signs BP 124/83   Pulse 94   Resp 18   SpO2 95%   Physical Exam Vitals and nursing note reviewed.  Constitutional:      General: He is not in acute distress.    Appearance: He is well-developed. He is obese. He is not ill-appearing.     Comments: Chronically ill appearing male in NAD. On 2L via [place]  HENT:     Head: Normocephalic and atraumatic.  Eyes:     General: No scleral icterus.       Right eye: No  discharge.        Left eye: No discharge.     Conjunctiva/sclera: Conjunctivae normal.     Pupils: Pupils are equal, round, and reactive to light.  Cardiovascular:     Rate and Rhythm: Normal rate and regular rhythm.  Pulmonary:     Effort: Pulmonary effort is normal. No respiratory distress.     Breath sounds: Normal breath sounds.  Abdominal:     General: There is no distension.     Palpations: Abdomen is soft.     Tenderness: There is no abdominal tenderness.  Musculoskeletal:     Cervical back: Normal range of motion.  Skin:    General: Skin is warm and dry.  Neurological:     Mental Status: He is alert and oriented to person, place, and time.  Psychiatric:        Behavior: Behavior normal.     ED Results / Procedures / Treatments   Labs (all labs ordered are listed, but only abnormal results are displayed) Labs Reviewed  BASIC METABOLIC PANEL - Abnormal; Notable for the following components:      Result Value   Glucose, Bld 222 (*)    Creatinine, Ser 1.65 (*)    GFR calc non Af Amer 42 (*)    GFR calc Af Amer 49 (*)    Anion gap 16 (*)    All other components within normal limits  CBC - Abnormal; Notable for the following components:   WBC 14.0 (*)    MCV 101.0 (*)    All other components within normal limits  RESPIRATORY PANEL BY RT PCR (FLU A&B, COVID)  TROPONIN I (HIGH SENSITIVITY)  TROPONIN I (HIGH SENSITIVITY)    EKG EKG Interpretation  Date/Time:  Tuesday Jul 02 2019 18:20:01 EDT Ventricular Rate:  99 PR Interval:  190 QRS Duration: 100 QT Interval:  334 QTC Calculation: 428 R Axis:   -3 Text Interpretation: Normal sinus rhythm Normal ECG No significant change since  last tracing Confirmed by Theotis Burrow [phone]) on 07/02/2019 10:24:17 PM   Radiology DG Chest 2 View  Result Date: 07/02/2019 CLINICAL DATA:  69 year old male with chest pain and shortness of breath. EXAM: CHEST - 2 VIEW COMPARISON:  Chest radiograph dated 06/22/2019. FINDINGS: There  is background of emphysema and hyperexpansion of the lungs. There are bibasilar atelectasis/scarring. No consolidative changes. There is no pleural effusion or pneumothorax. Stable cardiac silhouette. No acute osseous pathology is IMPRESSION: 1. No active cardiopulmonary disease. 2. Emphysema. Electronically Signed   By: Anner Crete M.D.   On: 07/02/2019 19:09    Procedures Procedures (including critical care time)  Medications Ordered in ED Medications  nitroGLYCERIN (NITROSTAT) SL tablet 0.4 mg (0.4 mg Sublingual Given 07/02/19 2311)  sodium chloride flush (NS) 0.9 % injection 3 mL (3 mLs Intravenous Given 07/02/19 2226)  ondansetron (ZOFRAN-ODT) disintegrating tablet 8 mg (8 mg Oral Given 07/02/19 2328)    ED Course  I have reviewed the triage vital signs and the nursing notes.  Pertinent labs & imaging results that were available during my care of the patient were reviewed by me and considered in my medical decision making (see chart for details).  69 year old male presents with acute onset of severe substernal chest pain radiating to the neck with associated diaphoresis, shortness of breath and nausea.  Symptoms have been constant since 430 this afternoon.  Chest pain is drastically improved after he was given nitroglycerin but still present.  EKG is sinus rhythm.  Labs show a leukocytosis (14), hyperglycemia (222), and AKI (1.6), with minimally elevated anion gap (16).  Patient was just on steroids which could be the cause of several of his lab abnormalities.  Initial and second troponin are 7.  Chest x-ray is normal.  Shared visit with Dr. Rex Kras.  Will discuss with cardiology  Discussed with Dr. De Nurse who recommends repeating nitro to see if it resolves the pain.  He recommends admission to hospitalist service since testing has all been normal here  Patient was given second dose of nitroglycerin and it has essentially resolved his pain.  Pt's HEART score is 6. Consulted Dr. Olevia Bowens with  [facility] who will admit  MDM Rules/Calculators/A&P                      Final Clinical Impression(s) / ED Diagnoses Final diagnoses:  Nonspecific chest pain  Hyperglycemia    Rx / DC Orders ED Discharge Orders    None       Recardo Evangelist, PA-C 07/03/19 0000    Little, Wenda Overland, MD 07/06/19 [phone]

## 2019-07-02 NOTE — H&P (Signed)
History and Physical    Clell Trahan KVT:[id number] DOB: 1950-12-02 DOA: 07/02/2019  PCP: [facility]  Patient coming from: Home.  I have personally briefly reviewed patient's old medical records in [place]  Chief Complaint: Chest pain.  HPI: Mario [name] is a 69 y.o. male with medical history significant of anxiety, bronchitis, COPD, depression, history of suicide attempt, tension pneumothorax, OSA Don CPAP, hypertension, morbid obesity, positive HLA-B27 vasculitis, CAD/history of MI who is coming to the emergency department due to pressure-like chest pain, radiated to his neck, associated with dyspnea, diaphoresis and nausea that started about 1630 while he was lying down.  He called EMS after 30 minutes since the pain would not relieve.  He got aspirin and sublingual nitroglycerin which decreased the pain substantially, but not completely.  He was given another NTG here in the ED and is CP free at the moment.  He complains of headache due to NTG.  He denies fever, chills, rhinorrhea, sore throat, wheezing or hemoptysis.  Denies abdominal pain, emesis, melena or hematochezia.  He has occasional diarrhea and frequent constipation.  No dysuria, frequency or hematuria.  ED Course: Initial vital signs temperature 97.7 F, pulse 91, respiration 18, blood pressure 127/68 mmHg and O2 sat 95% on room air.  The patient received sublingual nitroglycerin and 8 mg of Zofran-ODT.  Cardiology was consulted and recommended medicine admission.  The patient stated to me that he would prefer to follow-up with cardiology the [facility] clinic, unless cardiology evaluation cannot be postponed.  White count is 14.0, hemoglobin 15.9 g/dL and platelets 354.  Leukocytosis is consistent with recent measurement last week.  BMP shows normal electrolytes.  Glucose was 222, BUN 20 and creatinine 1.65.  Anion gap was 16.  EKG was sinus rhythm with abnormal R wave progression, troponin x2 was normal.  SARS 2 and  influenza PCR was negative.  His chest radiograph showed emphysema, but no active cardiopulmonary disease.  I discussed the patient's glucose level and he was surprised to know that he was diabetic.  He believes that this was due to prednisone, which he takes for his vasculitis.  Review of Systems: As per HPI otherwise all other systems reviewed and are negative.  Past Medical History:  Diagnosis Date  . Anxiety   . Bronchitis   . COPD (chronic obstructive pulmonary disease) ([place])   . Depression   . Hypertension   . Mental disorder   . MI (myocardial infarction) ([place])    ????  . OSA (obstructive sleep apnea)   . Suicide attempt ([place])   . Tension pneumothorax 06/27/2016    Past Surgical History:  Procedure Laterality Date  . CHEST TUBE INSERTION Left 06/27/2016  . cryptorchidism    . SKIN CANCER EXCISION      Social History  reports that he has been smoking cigarettes. He has a 10.00 pack-year smoking history. He has never used smokeless tobacco. He reports that he does not drink alcohol or use drugs.  Allergies  Allergen Reactions  . Demerol [Meperidine] Nausea And Vomiting and Other (See Comments)    Made the patient "violently sick"  . Zocor [Simvastatin] Nausea And Vomiting and Other (See Comments)    Made him very jittery, also  . Beet [Beta Vulgaris] Nausea And Vomiting  . Liver Nausea And Vomiting    Family History  Problem Relation Age of Onset  . Dementia Father    Prior to Admission medications   Medication Sig Start Date End Date Taking?  Authorizing Provider  albuterol (PROAIR HFA) 108 (90 Base) MCG/ACT inhaler Inhale 2 puffs into the lungs every 6 (six) hours as needed for wheezing or shortness of breath.   Yes [provider]  aspirin EC 81 MG tablet Take 81 mg by mouth daily.   Yes [provider]  buPROPion (WELLBUTRIN XL) 300 MG 24 hr tablet Take 300 mg by mouth daily after breakfast.    Yes [provider]  busPIRone  (BUSPAR) 15 MG tablet Take 15 mg by mouth in the morning.    Yes [provider]  calcium-vitamin D (OSCAL WITH D) 500-200 MG-UNIT tablet Take 1 tablet by mouth 2 (two) times daily with a meal.   Yes [provider]  Cholecalciferol (VITAMIN D-3) 25 MCG (1000 UT) CAPS Take 1,000 Units by mouth daily with breakfast.   Yes [provider]  clobetasol ointment (TEMOVATE) 6.46 % Apply 1 application topically See admin instructions. Apply a small amount to healed areas of legs 2 times a day as directed- avoid face and genitals   Yes [provider]  cyclobenzaprine (FLEXERIL) 10 MG tablet Take 10 mg by mouth at bedtime.   Yes [provider]  Dextran 70-Hypromellose (ARTIFICIAL TEARS PF OP) Place 1-2 drops into both eyes every 2 (two) hours as needed (for dryness).    Yes [provider]  diltiazem (CARDIZEM CD) 180 MG 24 hr capsule Take 180 mg by mouth daily.   Yes [provider]  docusate calcium (SURFAK) 240 MG capsule Take 240 mg by mouth daily.   Yes [provider]  DULoxetine (CYMBALTA) 60 MG capsule Take 1 capsule (60 mg total) by mouth 2 (two) times daily. Patient taking differently: Take 60 mg by mouth 2 (two) times daily.  07/21/17  Yes Lavina Hamman, MD  etanercept (ENBREL) 50 MG/ML injection Inject 50 mg into the skin every Wednesday.    Yes [provider]  folic acid (FOLVITE) 1 MG tablet Take 1 mg by mouth daily.   Yes [provider]  gabapentin (NEURONTIN) 300 MG capsule Take 2 capsules (600 mg total) by mouth 2 (two) times daily. Patient taking differently: Take 900 mg by mouth at bedtime.  07/06/16  Yes Lars Pinks M, PA-C  lisinopril (PRINIVIL,ZESTRIL) 20 MG tablet Take 1 tablet (20 mg total) by mouth daily. For high blood pressure Patient taking differently: Take 20 mg by mouth daily.  01/21/13  Yes Mashburn, Marlane Hatcher, PA-C  lurasidone (LATUDA) 40 MG TABS tablet Take 40 mg by mouth daily  after supper.   Yes [provider]  Melatonin 3 MG TABS Take 6 mg by mouth at bedtime.   Yes [provider]  methocarbamol (ROBAXIN) 500 MG tablet Take 500 mg by mouth 3 (three) times daily as needed (for muscular pain).   Yes [provider]  methotrexate (RHEUMATREX) 2.5 MG tablet Take 6 tablets (15 mg total) by mouth every Saturday. Hold until seen by rheumatology  Caution:Chemotherapy. Protect from light. Patient taking differently: Take 15 mg by mouth every Saturday. Caution:Chemotherapy. Protect from light. 07/22/17  Yes Lavina Hamman, MD  Multiple Vitamin (MULTIVITAMIN WITH MINERALS) TABS tablet Take 1 tablet by mouth daily.   Yes [provider]  mycophenolate (CELLCEPT) 250 MG capsule Take 1,500 mg by mouth 2 (two) times daily.   Yes [provider]  Oxcarbazepine (TRILEPTAL) 300 MG tablet Take 300 mg by mouth See admin instructions. Take 300 mg by mouth in the morning,  300 mg with supper/evening meal, and 300 mg at bedtime   Yes [provider]  OXYGEN Inhale 2 L/min into the lungs See admin instructions. 2 liters/min at bedtime with CPAP, during naps and all times of exertion   Yes [provider]  PRESCRIPTION MEDICATION CPAP- At bedtime and during during any naps   Yes [provider]  Propylene Glycol (SYSTANE BALANCE) 0.6 % SOLN Place 1 drop into both eyes 4 (four) times daily.   Yes [provider]  sodium chloride (OCEAN) 0.65 % SOLN nasal spray Place 2 sprays into both nostrils 4 (four) times daily as needed for congestion.   Yes [provider]  Tiotropium Bromide-Olodaterol (STIOLTO RESPIMAT) 2.5-2.5 MCG/ACT AERS Inhale 2 puffs into the lungs in the morning.   Yes [provider]  traZODone (DESYREL) 50 MG tablet Take 50-75 mg by mouth at bedtime.    Yes [provider]  vitamin C (ASCORBIC ACID) 500 MG tablet Take 500 mg by mouth 2 (two) times daily.    Yes [provider]  vitamin E (VITAMIN E) 180 MG (400 UNITS) capsule Take 400 Units by mouth daily.   Yes [provider]    Physical Exam: Vitals:   07/02/19 2230 07/02/19 2310 07/02/19 2315 07/02/19 2330  BP: (!) 128/99 127/74 94/60 119/78  Pulse: 93 92 (!) 105 93  Resp: 20 20 (!) 22 (!) 22  Temp:      TempSrc:      SpO2: 97% 95% 92% 95%    Constitutional: NAD, calm, comfortable Eyes: PERRL, lids and conjunctivae normal ENMT: Mucous membranes are moist. Posterior pharynx clear of any exudate or lesions. Neck: normal, supple, no masses, no thyromegaly Respiratory: Decreased breath sounds on bases, otherwise clear to auscultation bilaterally, no wheezing, no crackles. Normal respiratory effort. No accessory muscle use.  Cardiovascular: Regular rate and rhythm, no murmurs / rubs / gallops.  Stage II lower extremity lymphedema.  No lower extremities pitting edema. 2+ pedal pulses. No carotid bruits.  Abdomen: Obese.  Nondistended. Bowel sounds positive. Soft, no tenderness, no masses palpated. No hepatosplenomegaly. Musculoskeletal: no clubbing / cyanosis.  Good ROM, no contractures. Normal muscle tone.  Skin: Mild hyperkeratosis and vasculitis discoloration of the lower extremities. Neurologic: CN 2-12 grossly intact. Sensation intact, DTR normal. Strength 5/5 in all 4.  Psychiatric: Normal judgment and insight. Alert and oriented x 3. Normal mood.   Labs on Admission: I have personally reviewed following labs and imaging studies  CBC: Recent Labs  Lab 07/02/19 1850  WBC 14.0*  HGB 15.9  HCT 48.3  MCV 101.0*  PLT 097    Basic Metabolic Panel: Recent Labs  Lab 07/02/19 1850  NA 136  K 4.8  CL 98  CO2 22  GLUCOSE 222*  BUN 20  CREATININE 1.65*  CALCIUM 9.3    GFR: Estimated Creatinine Clearance: 67.1 mL/min (A) (by C-G formula based on SCr of 1.65 mg/dL (H)).  Liver Function Tests: No results for input(s): AST, ALT, ALKPHOS, BILITOT, PROT, ALBUMIN in the  last 168 hours.  Radiological Exams on Admission: DG Chest 2 View  Result Date: 07/02/2019 CLINICAL DATA:  69 year old male with chest pain and shortness of breath. EXAM: CHEST - 2 VIEW COMPARISON:  Chest radiograph dated 06/22/2019. FINDINGS: There is background of emphysema and hyperexpansion of the lungs. There are bibasilar atelectasis/scarring. No consolidative changes. There is no pleural effusion or pneumothorax. Stable cardiac silhouette. No acute osseous pathology is IMPRESSION: 1. No active  cardiopulmonary disease. 2. Emphysema. Electronically Signed   By: Anner Crete M.D.   On: 07/02/2019 19:05 September 2016 echocardiogram   LV EF: 50% -  55%   -------------------------------------------------------------------  Indications:   Chest pain 786.51.   -------------------------------------------------------------------  History:  PMH: Pneumothorax, Obesity, chest pain, tobacco use,  COPD, hypertension, MI.   -------------------------------------------------------------------  Study Conclusions   - Procedure narrative: Transthoracic echocardiography. Image  quality was fair. The study was technically difficult, as a  result of poor sound wave transmission.  - Left ventricle: The cavity size was normal. Wall thickness was  increased in a pattern of moderate LVH. Systolic function was  normal. The estimated ejection fraction was in the range of 50%  to 55%. Images were inadequate for LV wall motion assessment.  Doppler parameters are consistent with abnormal left ventricular  relaxation (grade 1 diastolic dysfunction). The E/e&' ratio is  between 8-15, suggesting indeterminate LV filling pressure.  - Mitral valve: Poorly visualized.  - Left atrium: The atrium was normal in size.  - Inferior vena cava: The vessel was dilated. The respirophasic  diameter changes were blunted (< 50%), consistent with elevated  central venous pressure.   EKG:  Independently reviewed.  Vent. rate 93 BPM PR interval * ms QRS duration 105 ms QT/QTc 341/425 ms P-R-T axes 59 3 73 Sinus rhythm Abnormal R-wave progression, early transition  Assessment/Plan Principal Problem:   Chest pain Heart score of at least 6. Observation/telemetry. Continue supplemental oxygen. SL NTG as needed. Morphine 4 mg IVP as needed. Check echocardiogram in a.m. Prefers to follow-up with cardiology at the [place].  Active Problems:   AKI (acute kidney injury) ([place]) Hold lisinopril. Hydrate. Monitor intake and output. Follow-up renal function electrolytes.    HTN (hypertension) Hold lisinopril. Continue diltiazem 180 mg p.o. daily. Monitor BP, HR, renal function electrolytes.    Depression Continue Wellbutrin 300 mg p.o. after breakfast. Continue BuSpar 15 mg p.o. in the morning. Continue duloxetine 60 mg p.o. twice daily. Continue Trileptal 300 mg p.o. 3 times daily. Continue trazodone as needed at bedtime.    DM2 (diabetes mellitus, type 2) ([place]) The patient stated he is not diabetic to his knowledge. There is a diagnosis and 3 of type 2 diabetes from 2 years ago. Carbohydrate modified diet. Check hemoglobin A1c.    OSA (obstructive sleep apnea) Continue CPAP at bedtime    COPD (chronic obstructive pulmonary disease) ([facility]) Supplemental oxygen as needed. Bronchodilators as needed.      DVT prophylaxis: Lovenox SQ. Code Status:   Full code. Family Communication: Disposition Plan:   Patient is from:  Home.  Anticipated DC to:  Home.  Anticipated DC date:  07/03/2019.  Anticipated DC barriers: Clinical improvement. Consults called:   Admission status:  Observation/telemetry.  Severity of Illness:  Moderate severity.  Reubin Milan MD [facility]  How to contact the [facility] Attending or Consulting provider [place] or covering provider during after hours [place], for this patient?   1. Check the care team in [facility] and look for a)  attending/consulting [facility] provider listed and b) the [facility] team listed 2. Log into [email or web address] and use [place]'s universal password to access. If you do not have the password, please contact the hospital operator. 3. Locate the [facility] provider you are looking for under [facility] and page to a number that you can be directly reached. 4. If you still have difficulty reaching the provider, please page the [facility] (Director on  Call) for the Hospitalists listed on amion for assistance.  07/02/2019, 11:58 PM   This document was prepared using Dragon voice recognition software and may contain some unintended transcription errors.

## 2019-07-03 ENCOUNTER — Observation Stay (HOSPITAL_BASED_OUTPATIENT_CLINIC_OR_DEPARTMENT_OTHER): Payer: No Typology Code available for payment source

## 2019-07-03 ENCOUNTER — Other Ambulatory Visit: Payer: Self-pay

## 2019-07-03 DIAGNOSIS — R079 Chest pain, unspecified: Secondary | ICD-10-CM

## 2019-07-03 LAB — ECHOCARDIOGRAM COMPLETE
Height: 74 in
Weight: 5291.04 oz

## 2019-07-03 LAB — COMPREHENSIVE METABOLIC PANEL
ALT: 27 U/L (ref 0–44)
AST: 20 U/L (ref 15–41)
Albumin: 3.6 g/dL (ref 3.5–5.0)
Alkaline Phosphatase: 54 U/L (ref 38–126)
Anion gap: 11 (ref 5–15)
BUN: 18 mg/dL (ref 8–23)
CO2: 24 mmol/L (ref 22–32)
Calcium: 9.2 mg/dL (ref 8.9–10.3)
Chloride: 99 mmol/L (ref 98–111)
Creatinine, Ser: 1.42 mg/dL — ABNORMAL HIGH (ref 0.61–1.24)
GFR calc Af Amer: 59 mL/min — ABNORMAL LOW (ref >60)
GFR calc non Af Amer: 51 mL/min — ABNORMAL LOW (ref >60)
Glucose, Bld: 279 mg/dL — ABNORMAL HIGH (ref 70–99)
Potassium: 4.5 mmol/L (ref 3.5–5.1)
Sodium: 134 mmol/L — ABNORMAL LOW (ref 135–145)
Total Bilirubin: 0.7 mg/dL (ref 0.3–1.2)
Total Protein: 6.4 g/dL — ABNORMAL LOW (ref 6.5–8.1)

## 2019-07-03 LAB — RESPIRATORY PANEL BY RT PCR (FLU A&B, COVID)
Influenza A by PCR: NEGATIVE
Influenza B by PCR: NEGATIVE
SARS Coronavirus 2 by RT PCR: NEGATIVE

## 2019-07-03 LAB — CBC
HCT: 45.4 % (ref 39.0–52.0)
Hemoglobin: 14.9 g/dL (ref 13.0–17.0)
MCH: 33.1 pg (ref 26.0–34.0)
MCHC: 32.8 g/dL (ref 30.0–36.0)
MCV: 100.9 fL — ABNORMAL HIGH (ref 80.0–100.0)
Platelets: 303 10*3/uL (ref 150–400)
RBC: 4.5 MIL/uL (ref 4.22–5.81)
RDW: 13.1 % (ref 11.5–15.5)
WBC: 12.5 10*3/uL — ABNORMAL HIGH (ref 4.0–10.5)
nRBC: 0 % (ref 0.0–0.2)

## 2019-07-03 LAB — TROPONIN I (HIGH SENSITIVITY)
Troponin I (High Sensitivity): 5 ng/L (ref <18)
Troponin I (High Sensitivity): 5 ng/L (ref <18)
Troponin I (High Sensitivity): 6 ng/L (ref <18)

## 2019-07-03 LAB — CBG MONITORING, ED
Glucose-Capillary: 239 mg/dL — ABNORMAL HIGH (ref 70–99)
Glucose-Capillary: 264 mg/dL — ABNORMAL HIGH (ref 70–99)

## 2019-07-03 LAB — HEMOGLOBIN A1C
Hgb A1c MFr Bld: 7.5 % — ABNORMAL HIGH (ref 4.8–5.6)
Mean Plasma Glucose: 168.55 mg/dL

## 2019-07-03 MED ORDER — NITROGLYCERIN 0.4 MG SL SUBL: 0.4000 mg | SUBLINGUAL_TABLET | SUBLINGUAL | 12 refills | Status: DC | PRN

## 2019-07-03 MED ORDER — ENOXAPARIN SODIUM 40 MG/0.4ML ~~LOC~~ SOLN
40.0000 mg | SUBCUTANEOUS | Status: DC
Start: 2019-07-03 — End: 2019-07-03

## 2019-07-03 MED ORDER — METHOCARBAMOL 500 MG PO TABS: 500.0000 mg | ORAL_TABLET | Freq: Three times a day (TID) | ORAL | Status: DC | PRN

## 2019-07-03 MED ORDER — GABAPENTIN 300 MG PO CAPS
900.0000 mg | ORAL_CAPSULE | Freq: Every day | ORAL | Status: DC
  Administered 2019-07-03: 900 mg via ORAL
  Filled 2019-07-03: qty 3

## 2019-07-03 MED ORDER — ACETAMINOPHEN 325 MG PO TABS: 650.0000 mg | ORAL_TABLET | Freq: Four times a day (QID) | ORAL | Status: DC | PRN

## 2019-07-03 MED ORDER — DILTIAZEM HCL ER COATED BEADS 180 MG PO CP24
180.0000 mg | ORAL_CAPSULE | Freq: Every day | ORAL | Status: DC
  Administered 2019-07-03: 180 mg via ORAL
  Filled 2019-07-03: qty 1

## 2019-07-03 MED ORDER — DULOXETINE HCL 60 MG PO CPEP
60.0000 mg | ORAL_CAPSULE | Freq: Two times a day (BID) | ORAL | Status: DC
  Administered 2019-07-03: 60 mg via ORAL
  Filled 2019-07-03: qty 1

## 2019-07-03 MED ORDER — PERFLUTREN LIPID MICROSPHERE
1.0000 mL | INTRAVENOUS | Status: AC | PRN
  Administered 2019-07-03: 3 mL via INTRAVENOUS
  Filled 2019-07-03: qty 10

## 2019-07-03 MED ORDER — ACETAMINOPHEN 650 MG RE SUPP: 650.0000 mg | Freq: Four times a day (QID) | RECTAL | Status: DC | PRN

## 2019-07-03 MED ORDER — ACETAMINOPHEN 325 MG PO TABS: 650.0000 mg | ORAL_TABLET | ORAL | Status: DC | PRN

## 2019-07-03 MED ORDER — MELATONIN 3 MG PO TABS
6.0000 mg | ORAL_TABLET | Freq: Every day | ORAL | Status: DC
  Administered 2019-07-03: 6 mg via ORAL
  Filled 2019-07-03 (×2): qty 2

## 2019-07-03 MED ORDER — ALBUTEROL SULFATE HFA 108 (90 BASE) MCG/ACT IN AERS: 2.0000 | INHALATION_SPRAY | Freq: Four times a day (QID) | RESPIRATORY_TRACT | Status: DC | PRN

## 2019-07-03 MED ORDER — ASPIRIN EC 81 MG PO TBEC
81.0000 mg | DELAYED_RELEASE_TABLET | Freq: Every day | ORAL | Status: DC
  Administered 2019-07-03: 81 mg via ORAL
  Filled 2019-07-03: qty 1

## 2019-07-03 MED ORDER — FOLIC ACID 1 MG PO TABS
1.0000 mg | ORAL_TABLET | Freq: Every day | ORAL | Status: DC
  Administered 2019-07-03: 1 mg via ORAL
  Filled 2019-07-03: qty 1

## 2019-07-03 MED ORDER — BUSPIRONE HCL 10 MG PO TABS
15.0000 mg | ORAL_TABLET | Freq: Every morning | ORAL | Status: DC
  Administered 2019-07-03: 15 mg via ORAL
  Filled 2019-07-03: qty 2

## 2019-07-03 MED ORDER — OXCARBAZEPINE 300 MG PO TABS: 300.0000 mg | ORAL_TABLET | ORAL | Status: DC

## 2019-07-03 MED ORDER — DOCUSATE SODIUM 100 MG PO CAPS
100.0000 mg | ORAL_CAPSULE | Freq: Every day | ORAL | Status: DC
  Administered 2019-07-03: 100 mg via ORAL
  Filled 2019-07-03: qty 1

## 2019-07-03 MED ORDER — ONDANSETRON HCL 4 MG/2ML IJ SOLN: 4.0000 mg | Freq: Four times a day (QID) | INTRAMUSCULAR | Status: DC | PRN

## 2019-07-03 MED ORDER — BUPROPION HCL ER (XL) 150 MG PO TB24
300.0000 mg | ORAL_TABLET | Freq: Every day | ORAL | Status: DC
  Administered 2019-07-03: 300 mg via ORAL
  Filled 2019-07-03: qty 2

## 2019-07-03 MED ORDER — SALINE SPRAY 0.65 % NA SOLN: 2.0000 | Freq: Four times a day (QID) | NASAL | Status: DC | PRN

## 2019-07-03 MED ORDER — MYCOPHENOLATE MOFETIL 250 MG PO CAPS
1500.0000 mg | ORAL_CAPSULE | Freq: Two times a day (BID) | ORAL | Status: DC
  Administered 2019-07-03: 1500 mg via ORAL
  Filled 2019-07-03 (×2): qty 6

## 2019-07-03 MED ORDER — LURASIDONE HCL 40 MG PO TABS: 40.0000 mg | ORAL_TABLET | Freq: Every day | ORAL | Status: DC

## 2019-07-03 MED ORDER — TRAZODONE HCL 50 MG PO TABS
50.0000 mg | ORAL_TABLET | Freq: Every day | ORAL | Status: DC
  Administered 2019-07-03: 75 mg via ORAL
  Filled 2019-07-03: qty 2

## 2019-07-03 MED ORDER — ENOXAPARIN SODIUM 80 MG/0.8ML ~~LOC~~ SOLN
75.0000 mg | SUBCUTANEOUS | Status: DC
  Administered 2019-07-03: 75 mg via SUBCUTANEOUS
  Filled 2019-07-03: qty 0.75

## 2019-07-03 MED ORDER — SODIUM CHLORIDE 0.45 % IV SOLN: INTRAVENOUS | Status: DC

## 2019-07-03 MED ORDER — MORPHINE SULFATE (PF) 4 MG/ML IV SOLN: 4.0000 mg | INTRAVENOUS | Status: DC | PRN

## 2019-07-03 MED ORDER — CYCLOBENZAPRINE HCL 10 MG PO TABS
10.0000 mg | ORAL_TABLET | Freq: Every day | ORAL | Status: DC
  Administered 2019-07-03: 10 mg via ORAL
  Filled 2019-07-03: qty 1

## 2019-07-03 MED ORDER — POLYVINYL ALCOHOL 1.4 % OP SOLN
1.0000 [drp] | Freq: Four times a day (QID) | OPHTHALMIC | Status: DC
  Administered 2019-07-03 (×2): 1 [drp] via OPHTHALMIC
  Filled 2019-07-03: qty 15

## 2019-07-03 NOTE — Progress Notes (Addendum)
Inpatient Diabetes Program Recommendations  AACE/ADA: New Consensus Statement on Inpatient Glycemic Control (2015)  Target Ranges:  Prepandial:   less than 140 mg/dL      Peak postprandial:   less than 180 mg/dL (1-2 hours)      Critically ill patients:  140 - 180 mg/dL   Lab Results  Component Value Date   GLUCAP 181 (H) 06/25/2019   HGBA1C 7.5 (H) 07/02/2019    Review of Glycemic Control Results for Mario [name], Mario [name] (MRN [id number]) as of 07/03/2019 09:41  Ref. Range 07/03/2019 05:40  Glucose Latest Ref Range: 70 - 99 mg/dL 279 (H)   Diabetes history: DM 2 Outpatient Diabetes medications: none Current orders for Inpatient glycemic control: none  Inpatient Diabetes Program Recommendations:    Glucose 200's in labs.  Consider CBGs and Novolog moderate 0-15 units tid + hs scale.  Thanks,  Tama Headings RN, MSN, BC-ADM Inpatient Diabetes Coordinator Team Pager [phone] (8a-5p)

## 2019-07-03 NOTE — Consult Note (Signed)
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  He described the pain as occurring over the center of the chest with some radiation to the neck.  He reports associated nausea and diaphoresis.  Of note he was evaluated in the hospital at the end of April 2021 for complaints of episodic chest pain and dyspnea.  He was treated at that time for COPD exacerbation with inhalers, antibiotics and steroids. He also had a cardiac cath in 2016 at the [facility]. He did not undergo an intervention.  The electrocardiogram in the hospital showed normal sinus rhythm with a ventricular rate of 93 beats per minute.  There were no ischemic ST-T wave changes.  The high-sensitivity troponins were 7 and 7.  His other labs were as follows: Sodium 136, potassium 4.8, glucose 222, creatinine 1.65, WBC 14, hemoglobin 15.9 and platelets of 354.  The chest x-ray was consistent with emphysema and hyperexpansion of the lungs.  There was no evidence of vascular congestion.  In the ED he was treated with 324 mg of aspirin, sublingual nitroglycerin and IV Zofran.  He reported some relief of his chest pain with the nitroglycerin.    Home Medications Prior to Admission medications   Medication Sig Start Date End Date Taking? Authorizing Provider  albuterol (PROAIR HFA) 108 (90 Base) MCG/ACT inhaler Inhale 2 puffs into the lungs every 6 (six) hours as needed for wheezing or  shortness of breath.   Yes [provider]  aspirin EC 81 MG tablet Take 81 mg by mouth daily.   Yes [provider]  buPROPion (WELLBUTRIN XL) 300 MG 24 hr tablet Take 300 mg by mouth daily after breakfast.    Yes [provider]  busPIRone (BUSPAR) 15 MG tablet Take 15 mg by mouth in the morning.    Yes [provider]  calcium-vitamin D (OSCAL WITH D) 500-200 MG-UNIT tablet Take 1 tablet by mouth 2 (two) times daily with a meal.   Yes [provider]  Cholecalciferol (VITAMIN D-3) 25 MCG (1000 UT) CAPS Take 1,000 Units by mouth daily with breakfast.   Yes [provider]  clobetasol ointment (TEMOVATE) 8.67 % Apply 1 application topically See admin instructions. Apply a small amount to healed areas of legs 2 times a day as directed- avoid face and genitals   Yes [provider]  cyclobenzaprine (FLEXERIL) 10 MG tablet Take 10 mg by mouth at bedtime.   Yes [provider]  Dextran 70-Hypromellose (ARTIFICIAL TEARS PF OP) Place 1-2 drops into both eyes every 2 (two) hours as needed (for dryness).    Yes [provider]  diltiazem (CARDIZEM CD) 180 MG 24 hr capsule Take 180 mg by mouth daily.   Yes [provider]  docusate calcium (SURFAK) 240 MG capsule Take 240 mg by mouth daily.   Yes [provider]  DULoxetine (CYMBALTA) 60 MG capsule Take 1 capsule (60 mg total) by mouth 2 (two) times daily. Patient  taking differently: Take 60 mg by mouth 2 (two) times daily.  07/21/17  Yes Lavina Hamman, MD  etanercept (ENBREL) 50 MG/ML injection Inject 50 mg into the skin every Wednesday.    Yes [provider]  folic acid (FOLVITE) 1 MG tablet Take 1 mg by mouth daily.   Yes [provider]  gabapentin (NEURONTIN) 300 MG capsule Take 2 capsules (600 mg total) by mouth 2 (two) times daily. Patient taking differently: Take 900 mg by mouth at bedtime.  07/06/16  Yes Lars Pinks M, PA-C    lisinopril (PRINIVIL,ZESTRIL) 20 MG tablet Take 1 tablet (20 mg total) by mouth daily. For high blood pressure Patient taking differently: Take 20 mg by mouth daily.  01/21/13  Yes Mashburn, Marlane Hatcher, PA-C  lurasidone (LATUDA) 40 MG TABS tablet Take 40 mg by mouth daily after supper.   Yes [provider]  Melatonin 3 MG TABS Take 6 mg by mouth at bedtime.   Yes [provider]  methocarbamol (ROBAXIN) 500 MG tablet Take 500 mg by mouth 3 (three) times daily as needed (for muscular pain).   Yes [provider]  methotrexate (RHEUMATREX) 2.5 MG tablet Take 6 tablets (15 mg total) by mouth every Saturday. Hold until seen by rheumatology  Caution:Chemotherapy. Protect from light. Patient taking differently: Take 15 mg by mouth every Saturday. Caution:Chemotherapy. Protect from light. 07/22/17  Yes Lavina Hamman, MD  Multiple Vitamin (MULTIVITAMIN WITH MINERALS) TABS tablet Take 1 tablet by mouth daily.   Yes [provider]  mycophenolate (CELLCEPT) 250 MG capsule Take 1,500 mg by mouth 2 (two) times daily.   Yes [provider]  Oxcarbazepine (TRILEPTAL) 300 MG tablet Take 300 mg by mouth See admin instructions. Take 300 mg by mouth in the morning, 300 mg with supper/evening meal, and 300 mg at bedtime   Yes [provider]  OXYGEN Inhale 2 L/min into the lungs See admin instructions. 2 liters/min at bedtime with CPAP, during naps and all times of exertion   Yes [provider]  PRESCRIPTION MEDICATION CPAP- At bedtime and during during any naps   Yes [provider]  Propylene Glycol (SYSTANE BALANCE) 0.6 % SOLN Place 1 drop into both eyes 4 (four) times daily.   Yes [provider]  sodium chloride (OCEAN) 0.65 % SOLN nasal spray Place 2 sprays into both nostrils 4 (four) times daily as needed for congestion.   Yes [provider]  Tiotropium Bromide-Olodaterol (STIOLTO RESPIMAT) 2.5-2.5 MCG/ACT AERS Inhale 2  puffs into the lungs in the morning.   Yes [provider]  traZODone (DESYREL) 50 MG tablet Take 50-75 mg by mouth at bedtime.    Yes [provider]  vitamin C (ASCORBIC ACID) 500 MG tablet Take 500 mg by mouth 2 (two) times daily.    Yes [provider]  vitamin E (VITAMIN E) 180 MG (400 UNITS) capsule Take 400 Units by mouth daily.   Yes [provider]    Past Medical History: Past Medical History:  Diagnosis Date  . Anxiety   . Bronchitis   . COPD (chronic obstructive pulmonary disease) ([place])   . Depression   . Hypertension   . Mental disorder   . MI (myocardial infarction) ([place])    ????  . OSA (obstructive sleep apnea)   . Suicide attempt ([place])   . Tension pneumothorax 06/27/2016    Past Surgical History: Past Surgical History:  Procedure Laterality Date  . CHEST  TUBE INSERTION Left 06/27/2016  . cryptorchidism    . SKIN CANCER EXCISION      Family History: Family History  Problem Relation Age of Onset  . Dementia Father     Social History: Social History   Socioeconomic History  . Marital status: Single    Spouse name: Not on file  . Number of children: Not on file  . Years of education: Not on file  . Highest education level: Not on file  Occupational History  . Not on file  Tobacco Use  . Smoking status: Current Every Day Smoker    Packs/day: 0.50    Years: 20.00    Pack years: 10.00    Types: Cigarettes  . Smokeless tobacco: Never Used  Substance and Sexual Activity  . Alcohol use: No    Alcohol/week: 0.0 standard drinks    Comment: denies use of any drugs or alcohol  . Drug use: No  . Sexual activity: Not on file  Other Topics Concern  . Not on file  Social History Narrative  . Not on file   Social Determinants of Health   Financial Resource Strain:   . Difficulty of Paying Living Expenses:   Food Insecurity:   . Worried About Charity fundraiser in the Last Year:   . Arboriculturist in the Last  Year:   Transportation Needs:   . Film/video editor (Medical):   [name] Lack of Transportation (Non-Medical):   Physical Activity:   . Days of Exercise per Week:   . Minutes of Exercise per Session:   Stress:   . Feeling of Stress :   Social Connections:   . Frequency of Communication with Friends and Family:   . Frequency of Social Gatherings with Friends and Family:   . Attends Religious Services:   . Active Member of Clubs or Organizations:   . Attends Archivist Meetings:   [name] Marital Status:     Allergies:  Allergies  Allergen Reactions  . Demerol [Meperidine] Nausea And Vomiting and Other (See Comments)    Made the patient "violently sick"  . Zocor [Simvastatin] Nausea And Vomiting and Other (See Comments)    Made him very jittery, also  . Beet [Beta Vulgaris] Nausea And Vomiting  . Liver Nausea And Vomiting     Review of Systems: [y] = yes, [ ]  = no   . General: Weight gain [ ] ; Weight loss [ ] ; Anorexia [ ] ; Fatigue [ ] ; Fever [ ] ; Chills [ ] ; Weakness [ ] ; Diaphoresis [Y]; . Cardiac: Chest pain/pressure [Y]; Resting SOB [Y]; Exertional SOB [ ] ; Orthopnea [ ] ; Pedal Edema [ ] ; Palpitations [ ] ; Syncope [ ] ; Presyncope [ ] ; Paroxysmal nocturnal dyspnea[ ]   . Pulmonary: Cough [ ] ; Wheezing[ ] ; Hemoptysis[ ] ; Sputum [ ] ; Snoring [ ]   . GI: Nausea [Y]; Vomiting[ ] ; Dysphagia[ ] ; Melena[ ] ; Hematochezia [ ] ; Heartburn[ ] ; Abdominal pain [ ] ; Constipation [ ] ; Diarrhea [ ] ; BRBPR [ ]   . GU: Hematuria[ ] ; Dysuria [ ] ; Nocturia[ ]   . Vascular: Pain in legs with walking [ ] ; Pain in feet with lying flat [ ] ; Non-healing sores [ ] ; Stroke [ ] ; TIA [ ] ; Slurred speech [ ] ;  . Neuro: Headaches[ ] ; Vertigo[ ] ; Seizures[ ] ; Paresthesias[ ] ;Blurred vision [ ] ; Diplopia [ ] ; Vision changes [ ]   . Ortho/Skin: Arthritis [ ] ; Joint pain [ ] ; Muscle pain [ ] ; Joint swelling [ ] ; Back Pain [ ] ;  Rash [ ]   . Psych: Depression[ ] ; Anxiety[ ]   . Heme: Bleeding problems [ ] ; Clotting  disorders [ ] ; Anemia [ ]   . Endocrine: Diabetes [ ] ; Thyroid dysfunction[ ]      Objective:    Vital Signs:   Temp:  [97.7 F (36.5 C)] 97.7 F (36.5 C) (05/04 2225) Pulse Rate:  [91-105] 93 (05/04 2330) Resp:  [13-22] 22 (05/04 2330) BP: (94-128)/(60-99) 119/78 (05/04 2330) SpO2:  [92 %-97 %] 95 % (05/04 2330)    Weight change: There were no vitals filed for this visit.  Intake/Output:  No intake or output data in the 24 hours ending 07/03/19 0009    Physical Exam    General:  Well appearing. No resp difficulty, obese HEENT: normal Neck: supple. JVP . Carotids 2+ bilat; no bruits. No lymphadenopathy or thyromegaly appreciated. Cor: PMI nondisplaced. Regular rate & rhythm. No rubs, gallops or murmurs. Lungs: clear to auscultation bilaterally Abdomen: soft, nontender, nondistended. No hepatosplenomegaly. No bruits or masses. Good bowel sounds. Extremities: no cyanosis, clubbing, rash, edema Neuro: alert & orientedx3, cranial nerves grossly intact. moves all 4 extremities w/o difficulty.  Affect normal    EKG    The ECG from 07/02/2019, that I reviewed personally shows normal sinus rhythm with a ventricular rate of 99 bpm.  There are no ischemic ST-T wave changes.  Labs   Basic Metabolic Panel: Recent Labs  Lab 07/02/19 1850  NA 136  K 4.8  CL 98  CO2 22  GLUCOSE 222*  BUN 20  CREATININE 1.65*  CALCIUM 9.3    Liver Function Tests: No results for input(s): AST, ALT, ALKPHOS, BILITOT, PROT, ALBUMIN in the last 168 hours. No results for input(s): LIPASE, AMYLASE in the last 168 hours. No results for input(s): AMMONIA in the last 168 hours.  CBC: Recent Labs  Lab 07/02/19 1850  WBC 14.0*  HGB 15.9  HCT 48.3  MCV 101.0*  PLT 354    Cardiac Enzymes: No results for input(s): CKTOTAL, CKMB, CKMBINDEX, TROPONINI in the last 168 hours.  BNP: BNP (last 3 results) No results for input(s): BNP in the last 8760 hours.  ProBNP (last 3 results) No results  for input(s): PROBNP in the last 8760 hours.   CBG: No results for input(s): GLUCAP in the last 168 hours.  Coagulation Studies: No results for input(s): LABPROT, INR in the last 72 hours.   Imaging   DG Chest 2 View  Result Date: 07/02/2019 CLINICAL DATA:  69 year old male with chest pain and shortness of breath. EXAM: CHEST - 2 VIEW COMPARISON:  Chest radiograph dated 06/22/2019. FINDINGS: There is background of emphysema and hyperexpansion of the lungs. There are bibasilar atelectasis/scarring. No consolidative changes. There is no pleural effusion or pneumothorax. Stable cardiac silhouette. No acute osseous pathology is IMPRESSION: 1. No active cardiopulmonary disease. 2. Emphysema. Electronically Signed   By: Anner Crete M.D.   On: 07/02/2019 19:09       Assessment/Plan   1. Chest pain The patient presents to the hospital with complaints of severe constant chest pain occurring for hours prior to presentation.  His high-sensitivity troponins are negative.  The ECG does not reveal any ischemic changes.  He does have risk factors for heart disease such as diabetes mellitus and hypertension.  -Agree with admission to telemetry for overnight observation -Serial ECGs -Continue to trend cardiac biomarkers -Consider aspirin 81 mg daily -Check a lipid panel -Nitroglycerin as needed for chest pain -Transthoracic echocardiogram -NPO after midnight  We will evaluate his need for an ischemic work-up based on the initial interpretation of a transthoracic echocardiogram that can evaluate the LV function and rule out any valvular abnormality.  We will continue to reassess the patient once more objective clinical data is available.   Meade Maw, MD  07/03/2019, 12:09 AM  Cardiology Overnight Team Please contact [facility] for night-coverage after hours (4p -7a ) and weekends on [email or web address]

## 2019-07-03 NOTE — Discharge Summary (Signed)
Physician Discharge Summary  Mario [name] HDQ:[id number] DOB: 11-May-1950 DOA: 07/02/2019  PCP: [facility]  Admit date: 07/02/2019 Discharge date: 07/03/2019  Admitted From: Home  Disposition:  Hom   Recommendations for Outpatient Follow-up:  1. Follow up with PCP in 1-2 weeks  Home Health: None  Equipment/Devices: None  Discharge Condition: Good  CODE STATUS: FULL Diet recommendation: Cardiac  Brief/Interim Summary: Mario [name] is a 69 y.o. male with medical history significant of anxiety, bronchitis, COPD, depression, history of suicide attempt, tension pneumothorax, OSA Don CPAP, hypertension, morbid obesity, positive HLA-B27 vasculitis, CAD/history of MI who is coming to the emergency department due to pressure-like chest pain, radiated to his neck, associated with dyspnea, diaphoresis and nausea that started about 1630 while he was lying down.  He called EMS after 30 minutes since the pain would not relieve.  He got aspirin and sublingual nitroglycerin which decreased the pain substantially, but not completely.  He was given another NTG here in the ED and is CP free at the moment.  He complains of headache due to NTG.  He denies fever, chills, rhinorrhea, sore throat, wheezing or hemoptysis.  Denies abdominal pain, emesis, melena or hematochezia.  He has occasional diarrhea and frequent constipation.  No dysuria, frequency or hematuria.   In the ER, initial ECG was unremarkable, serial troponins were negative.  Given cardiac risk factors, the patient was admitted for risk stratification by Cardiology.     PRINCIPAL HOSPITAL DIAGNOSIS: Chest pain, likely noncardiac    Discharge Diagnoses:   Chest pain Patient admitted.  Serial troponins negative.  Chest pain resolved and did not recur with ambulation.  Patient underwent echo that showed no regional wall motion abnormalities.  He was evaluated by Cardiology who felt no further ischemic work up was necessary and  patient should follow up with primary Cardiologist at the [place].             Discharge Instructions  Discharge Instructions    Diet - low sodium heart healthy   Complete by: As directed    Discharge instructions   Complete by: As directed    You were admitted for chest pain. While you were here, your testing was reassuring and your heart ultrasound showed no signs of recent heart attack.  You should call your primary care doctor for a follow up as soon as possible.  We will forward your discharge summary to him.  Ask him to forward this to your cardiologist and schedule you an appointment to see your cardiologist as soon as possible.   Increase activity slowly   Complete by: As directed      Allergies as of 07/03/2019      Reactions   Demerol [meperidine] Nausea And Vomiting, Other (See Comments)   Made the patient "violently sick"   Zocor [simvastatin] Nausea And Vomiting, Other (See Comments)   Made him very jittery, also   Beet [beta Vulgaris] Nausea And Vomiting   Liver Nausea And Vomiting      Medication List    TAKE these medications   ARTIFICIAL TEARS PF OP Place 1-2 drops into both eyes every 2 (two) hours as needed (for dryness).   aspirin EC 81 MG tablet Take 81 mg by mouth daily.   buPROPion 300 MG 24 hr tablet Commonly known as: WELLBUTRIN XL Take 300 mg by mouth daily after breakfast.   busPIRone 15 MG tablet Commonly known as: BUSPAR Take 15 mg by mouth in the morning.   calcium-vitamin D  500-200 MG-UNIT tablet Commonly known as: OSCAL WITH D Take 1 tablet by mouth 2 (two) times daily with a meal.   clobetasol ointment 0.05 % Commonly known as: TEMOVATE Apply 1 application topically See admin instructions. Apply a small amount to healed areas of legs 2 times a day as directed- avoid face and genitals   cyclobenzaprine 10 MG tablet Commonly known as: FLEXERIL Take 10 mg by mouth at bedtime.   diltiazem 180 MG 24 hr capsule Commonly known as:  CARDIZEM CD Take 180 mg by mouth daily.   docusate calcium 240 MG capsule Commonly known as: SURFAK Take 240 mg by mouth daily.   DULoxetine 60 MG capsule Commonly known as: CYMBALTA Take 1 capsule (60 mg total) by mouth 2 (two) times daily.   Enbrel 50 MG/ML injection Generic drug: etanercept Inject 50 mg into the skin every Wednesday.   folic acid 1 MG tablet Commonly known as: FOLVITE Take 1 mg by mouth daily.   gabapentin 300 MG capsule Commonly known as: NEURONTIN Take 2 capsules (600 mg total) by mouth 2 (two) times daily. What changed:   how much to take  when to take this   lisinopril 20 MG tablet Commonly known as: ZESTRIL Take 1 tablet (20 mg total) by mouth daily. For high blood pressure What changed: additional instructions   lurasidone 40 MG Tabs tablet Commonly known as: LATUDA Take 40 mg by mouth daily after supper.   melatonin 3 MG Tabs tablet Take 6 mg by mouth at bedtime.   methocarbamol 500 MG tablet Commonly known as: ROBAXIN Take 500 mg by mouth 3 (three) times daily as needed (for muscular pain).   methotrexate 2.5 MG tablet Commonly known as: RHEUMATREX Take 6 tablets (15 mg total) by mouth every Saturday. Hold until seen by rheumatology  Caution:Chemotherapy. Protect from light. What changed: additional instructions   multivitamin with minerals Tabs tablet Take 1 tablet by mouth daily.   mycophenolate 250 MG capsule Commonly known as: CELLCEPT Take 1,500 mg by mouth 2 (two) times daily.   nitroGLYCERIN 0.4 MG SL tablet Commonly known as: NITROSTAT Place 1 tablet (0.4 mg total) under the tongue every 5 (five) minutes as needed for chest pain.   Oxcarbazepine 300 MG tablet Commonly known as: TRILEPTAL Take 300 mg by mouth See admin instructions. Take 300 mg by mouth in the morning, 300 mg with supper/evening meal, and 300 mg at bedtime   OXYGEN Inhale 2 L/min into the lungs See admin instructions. 2 liters/min at bedtime with  CPAP, during naps and all times of exertion   PRESCRIPTION MEDICATION CPAP- At bedtime and during during any naps   ProAir HFA 108 (90 Base) MCG/ACT inhaler Generic drug: albuterol Inhale 2 puffs into the lungs every 6 (six) hours as needed for wheezing or shortness of breath.   sodium chloride 0.65 % Soln nasal spray Commonly known as: OCEAN Place 2 sprays into both nostrils 4 (four) times daily as needed for congestion.   Stiolto Respimat 2.5-2.5 MCG/ACT Aers Generic drug: Tiotropium Bromide-Olodaterol Inhale 2 puffs into the lungs in the morning.   Systane Balance 0.6 % Soln Generic drug: Propylene Glycol Place 1 drop into both eyes 4 (four) times daily.   traZODone 50 MG tablet Commonly known as: DESYREL Take 50-75 mg by mouth at bedtime.   vitamin C 500 MG tablet Commonly known as: ASCORBIC ACID Take 500 mg by mouth 2 (two) times daily.   Vitamin D-3 25 MCG (1000 UT) Caps  Take 1,000 Units by mouth daily with breakfast.   vitamin E 180 MG (400 UNITS) capsule Generic drug: vitamin E Take 400 Units by mouth daily.       Allergies  Allergen Reactions  . Demerol [Meperidine] Nausea And Vomiting and Other (See Comments)    Made the patient "violently sick"  . Zocor [Simvastatin] Nausea And Vomiting and Other (See Comments)    Made him very jittery, also  . Beet [Beta Vulgaris] Nausea And Vomiting  . Liver Nausea And Vomiting    Consultations:  Cardiology   Procedures/Studies: DG Chest 2 View  Result Date: 07/02/2019 CLINICAL DATA:  69 year old male with chest pain and shortness of breath. EXAM: CHEST - 2 VIEW COMPARISON:  Chest radiograph dated 06/22/2019. FINDINGS: There is background of emphysema and hyperexpansion of the lungs. There are bibasilar atelectasis/scarring. No consolidative changes. There is no pleural effusion or pneumothorax. Stable cardiac silhouette. No acute osseous pathology is IMPRESSION: 1. No active cardiopulmonary disease. 2. Emphysema.  Electronically Signed   By: Anner Crete M.D.   On: 07/02/2019 19:09   DG Chest 2 View  Result Date: 06/22/2019 CLINICAL DATA:  COPD exacerbation, suicidal ideation EXAM: CHEST - 2 VIEW COMPARISON:  10/10/2018 FINDINGS: Frontal and lateral views of the chest demonstrate a stable cardiac silhouette. Extensive background emphysema with bibasilar scarring unchanged. No airspace disease, effusion, or pneumothorax. No acute bony abnormalities. IMPRESSION: 1. Severe emphysema, no acute process. Electronically Signed   By: Randa Ngo M.D.   On: 06/22/2019 21:31   ECHOCARDIOGRAM COMPLETE  Result Date: 07/03/2019    ECHOCARDIOGRAM REPORT   Patient Name:   Mario [name] Date of Exam: 07/03/2019 Medical Rec #:  [id number]      Height:       74.0 in Accession #:    [id number]     Weight:       330.7 lb Date of Birth:  Jun 05, 1950      BSA:          2.691 m Patient Age:    62 years       BP:           108/69 mmHg Patient Gender: M              HR:           108 bpm. Exam Location:  Inpatient Procedure: 2D Echo Indications:    Chest Pain R07.9  History:        Patient has prior history of Echocardiogram examinations, most                 recent 09/19/2016. Previous Myocardial Infarction; Risk                 Factors:Hypertension.  Sonographer:    Mikki Santee RDCS (AE) Referring Phys: [id number] DAVID MANUEL [place]  1. Left ventricular ejection fraction, by estimation, is 50 to 55%. The left ventricle has low normal function. Left ventricular endocardial border not optimally defined to evaluate regional wall motion. There is mild left ventricular hypertrophy. Left ventricular diastolic parameters are indeterminate.  2. Right ventricular systolic function is normal. The right ventricular size is normal.  3. The mitral valve is normal in structure. No evidence of mitral valve regurgitation. No evidence of mitral stenosis.  4. The aortic valve is normal in structure. Aortic valve regurgitation is not  visualized. No aortic stenosis is present.  5. The inferior vena cava is dilated in size with <50% respiratory variability, suggesting  right atrial pressure of 15 mmHg. FINDINGS  Left Ventricle: Left ventricular ejection fraction, by estimation, is 50 to 55%. The left ventricle has low normal function. Left ventricular endocardial border not optimally defined to evaluate regional wall motion. Definity contrast agent was given IV  to delineate the left ventricular endocardial borders. The left ventricular internal cavity size was normal in size. There is mild left ventricular hypertrophy. Left ventricular diastolic parameters are indeterminate. Right Ventricle: The right ventricular size is normal. No increase in right ventricular wall thickness. Right ventricular systolic function is normal. Left Atrium: Left atrial size was normal in size. Right Atrium: Right atrial size was normal in size. Pericardium: There is no evidence of pericardial effusion. Mitral Valve: The mitral valve is normal in structure. Normal mobility of the mitral valve leaflets. No evidence of mitral valve regurgitation. No evidence of mitral valve stenosis. Tricuspid Valve: The tricuspid valve is normal in structure. Tricuspid valve regurgitation is not demonstrated. No evidence of tricuspid stenosis. Aortic Valve: The aortic valve is normal in structure. Aortic valve regurgitation is not visualized. No aortic stenosis is present. Pulmonic Valve: The pulmonic valve was normal in structure. Pulmonic valve regurgitation is not visualized. No evidence of pulmonic stenosis. Aorta: The aortic root is normal in size and structure. Venous: The inferior vena cava is dilated in size with less than 50% respiratory variability, suggesting right atrial pressure of 15 mmHg. IAS/Shunts: No atrial level shunt detected by color flow Doppler.  LEFT VENTRICLE PLAX 2D LVIDd:         3.90 cm  Diastology LVIDs:         2.80 cm  LV e' lateral: 14.60 cm/s LV PW:          1.20 cm  LV e' medial:  11.10 cm/s LV IVS:        1.20 cm LVOT diam:     2.60 cm LV SV:         59 LV SV Index:   22 LVOT Area:     5.31 cm  RIGHT VENTRICLE RV S prime:     12.40 cm/s TAPSE (M-mode): 1.7 cm LEFT ATRIUM             Index       RIGHT ATRIUM           Index LA diam:        2.70 cm 1.00 cm/m  RA Area:     15.10 cm LA Vol (A2C):   29.4 ml 10.92 ml/m RA Volume:   37.30 ml  13.86 ml/m LA Vol (A4C):   26.6 ml 9.88 ml/m LA Biplane Vol: 28.2 ml 10.48 ml/m  AORTIC VALVE LVOT Vmax:   53.50 cm/s LVOT Vmean:  39.100 cm/s LVOT VTI:    0.112 m  AORTA Ao Root diam: 3.40 cm  SHUNTS Systemic VTI:  0.11 m Systemic Diam: 2.60 cm Candee Furbish MD Electronically signed by Candee Furbish MD Signature Date/Time: 07/03/2019/1:07:25 PM    Final        Subjective: No chest pain, dyspnea, orthopnea, GERD.  Discharge Exam: Vitals:   07/03/19 1300 07/03/19 1450  BP: 136/87 131/74  Pulse: (!) 102 (!) 101  Resp: (!) 22 17  Temp:  97.7 F (36.5 C)  SpO2: 93% 93%   Vitals:   07/03/19 1100 07/03/19 1200 07/03/19 1300 07/03/19 1450  BP: 110/62 128/79 136/87 131/74  Pulse: (!) 106 98 (!) 102 (!) 101  Resp: 19 20 (!) 22 17  Temp:  97.7 F (36.5 C)  TempSrc:    Oral  SpO2: 97% 98% 93% 93%  Weight:      Height:        General: Pt is alert, awake, not in acute distress Cardiovascular: RRR, nl S1-S2, no murmurs appreciated.   No LE edema.   Respiratory: Normal respiratory rate and rhythm.  CTAB without rales or wheezes. Abdominal: Abdomen soft and non-tender.  No distension or HSM.   Neuro/Psych: Strength symmetric in upper and lower extremities.  Judgment and insight appear normal.   The results of significant diagnostics from this hospitalization (including imaging, microbiology, ancillary and laboratory) are listed below for reference.     Microbiology: Recent Results (from the past 240 hour(s))  Respiratory Panel by RT PCR (Flu A&B, Covid) - Nasopharyngeal Swab     Status: None    Collection Time: 07/02/19 11:26 PM   Specimen: Nasopharyngeal Swab  Result Value Ref Range Status   SARS Coronavirus 2 by RT PCR NEGATIVE NEGATIVE Final    Comment: (NOTE) SARS-CoV-2 target nucleic acids are NOT DETECTED. The SARS-CoV-2 RNA is generally detectable in upper respiratoy specimens during the acute phase of infection. The lowest concentration of SARS-CoV-2 viral copies this assay can detect is 131 copies/mL. A negative result does not preclude SARS-Cov-2 infection and should not be used as the sole basis for treatment or other patient management decisions. A negative result may occur with  improper specimen collection/handling, submission of specimen other than nasopharyngeal swab, presence of viral mutation(s) within the areas targeted by this assay, and inadequate number of viral copies (<131 copies/mL). A negative result must be combined with clinical observations, patient history, and epidemiological information. The expected result is Negative. Fact Sheet for Patients:  PinkCheek.be Fact Sheet for Healthcare Providers:  GravelBags.it This test is not yet ap proved or cleared by the [place] FDA and  has been authorized for detection and/or diagnosis of SARS-CoV-2 by FDA under an Emergency Use Authorization (EUA). This EUA will remain  in effect (meaning this test can be used) for the duration of the COVID-19 declaration under Section 564(b)(1) of the Act, 21 U.S.C. section 360bbb-3(b)(1), unless the authorization is terminated or revoked sooner.    Influenza A by PCR NEGATIVE NEGATIVE Final   Influenza B by PCR NEGATIVE NEGATIVE Final    Comment: (NOTE) The Xpert Xpress SARS-CoV-2/FLU/RSV assay is intended as an aid in  the diagnosis of influenza from Nasopharyngeal swab specimens and  should not be used as a sole basis for treatment. Nasal washings and  aspirates are unacceptable for Xpert Xpress  SARS-CoV-2/FLU/RSV  testing. Fact Sheet for Patients: PinkCheek.be Fact Sheet for Healthcare Providers: GravelBags.it This test is not yet approved or cleared by the [place] FDA and  has been authorized for detection and/or diagnosis of SARS-CoV-2 by  FDA under an Emergency Use Authorization (EUA). This EUA will remain  in effect (meaning this test can be used) for the duration of the  Covid-19 declaration under Section 564(b)(1) of the Act, 21  U.S.C. section 360bbb-3(b)(1), unless the authorization is  terminated or revoked. Performed at [facility], [place] [address] [place], [place] [address]      Labs: BNP (last 3 results) No results for input(s): BNP in the last 8760 hours. Basic Metabolic Panel: Recent Labs  Lab 07/02/19 1850 07/03/19 0540  NA 136 134*  K 4.8 4.5  CL 98 99  CO2 22 24  GLUCOSE 222* 279*  BUN 20 18  CREATININE 1.65* 1.42*  CALCIUM 9.3 9.2   Liver Function Tests: Recent Labs  Lab 07/03/19 0540  AST 20  ALT 27  ALKPHOS 54  BILITOT 0.7  PROT 6.4*  ALBUMIN 3.6   No results for input(s): LIPASE, AMYLASE in the last 168 hours. No results for input(s): AMMONIA in the last 168 hours. CBC: Recent Labs  Lab 07/02/19 1850 07/03/19 0540  WBC 14.0* 12.5*  HGB 15.9 14.9  HCT 48.3 45.4  MCV 101.0* 100.9*  PLT 354 303   Cardiac Enzymes: No results for input(s): CKTOTAL, CKMB, CKMBINDEX, TROPONINI in the last 168 hours. BNP: Invalid input(s): POCBNP CBG: Recent Labs  Lab 07/03/19 0737 07/03/19 1152  GLUCAP 239* 264*   D-Dimer No results for input(s): DDIMER in the last 72 hours. Hgb A1c Recent Labs    07/02/19 1850  HGBA1C 7.5*   Lipid Profile No results for input(s): CHOL, HDL, LDLCALC, TRIG, CHOLHDL, LDLDIRECT in the last 72 hours. Thyroid function studies No results for input(s): TSH, T4TOTAL, T3FREE, THYROIDAB in the last 72 hours.  Invalid input(s):  FREET3 Anemia work up No results for input(s): VITAMINB12, FOLATE, FERRITIN, TIBC, IRON, RETICCTPCT in the last 72 hours. Urinalysis    Component Value Date/Time   COLORURINE YELLOW 11/10/2017 [place] 11/10/2017 1746   LABSPEC 1.010 11/10/2017 1746   PHURINE 7.0 11/10/2017 1746   GLUCOSEU NEGATIVE 11/10/2017 1746   HGBUR NEGATIVE 11/10/2017 1746   BILIRUBINUR NEGATIVE 11/10/2017 1746   KETONESUR NEGATIVE 11/10/2017 1746   PROTEINUR NEGATIVE 11/10/2017 1746   UROBILINOGEN 1.0 01/12/2013 0024   NITRITE NEGATIVE 11/10/2017 1746   LEUKOCYTESUR NEGATIVE 11/10/2017 1746   Sepsis Labs Invalid input(s): PROCALCITONIN,  WBC,  LACTICIDVEN Microbiology Recent Results (from the past 240 hour(s))  Respiratory Panel by RT PCR (Flu A&B, Covid) - Nasopharyngeal Swab     Status: None   Collection Time: 07/02/19 11:26 PM   Specimen: Nasopharyngeal Swab  Result Value Ref Range Status   SARS Coronavirus 2 by RT PCR NEGATIVE NEGATIVE Final    Comment: (NOTE) SARS-CoV-2 target nucleic acids are NOT DETECTED. The SARS-CoV-2 RNA is generally detectable in upper respiratoy specimens during the acute phase of infection. The lowest concentration of SARS-CoV-2 viral copies this assay can detect is 131 copies/mL. A negative result does not preclude SARS-Cov-2 infection and should not be used as the sole basis for treatment or other patient management decisions. A negative result may occur with  improper specimen collection/handling, submission of specimen other than nasopharyngeal swab, presence of viral mutation(s) within the areas targeted by this assay, and inadequate number of viral copies (<131 copies/mL). A negative result must be combined with clinical observations, patient history, and epidemiological information. The expected result is Negative. Fact Sheet for Patients:  PinkCheek.be Fact Sheet for Healthcare Providers:   GravelBags.it This test is not yet ap proved or cleared by the [place] FDA and  has been authorized for detection and/or diagnosis of SARS-CoV-2 by FDA under an Emergency Use Authorization (EUA). This EUA will remain  in effect (meaning this test can be used) for the duration of the COVID-19 declaration under Section 564(b)(1) of the Act, 21 U.S.C. section 360bbb-3(b)(1), unless the authorization is terminated or revoked sooner.    Influenza A by PCR NEGATIVE NEGATIVE Final   Influenza B by PCR NEGATIVE NEGATIVE Final    Comment: (NOTE) The Xpert Xpress SARS-CoV-2/FLU/RSV assay is intended as an aid in  the diagnosis of influenza from Nasopharyngeal swab specimens  and  should not be used as a sole basis for treatment. Nasal washings and  aspirates are unacceptable for Xpert Xpress SARS-CoV-2/FLU/RSV  testing. Fact Sheet for Patients: PinkCheek.be Fact Sheet for Healthcare Providers: GravelBags.it This test is not yet approved or cleared by the [place] FDA and  has been authorized for detection and/or diagnosis of SARS-CoV-2 by  FDA under an Emergency Use Authorization (EUA). This EUA will remain  in effect (meaning this test can be used) for the duration of the  Covid-19 declaration under Section 564(b)(1) of the Act, 21  U.S.C. section 360bbb-3(b)(1), unless the authorization is  terminated or revoked. Performed at [facility], [place] [address] [place], [place] [address]      Time coordinating discharge: 25 minutes      SIGNED:   Edwin Dada, MD  [facility] 07/03/2019, 8:13 PM

## 2019-07-03 NOTE — Progress Notes (Signed)
Discussed with patient his sx this am and reviewed H&P by Dr. Paticia Stack.  Patient developed central CP while laying down.  No radiation but had some discomfort in the back of his neck.  He felt hot all over.  Pain lasted 90 minutes and improved some with SL NTG.  hsTrop neg x 5 and EKG nonischemic.  2D echo with low normal LVF and no focal wall motion abnormality.  He has not had any further pain.  ? Cardiac related or esophageal spasm.  OK to discharge home and have patient followup with his Cardiologist from the [place] tomorrow or Friday.

## 2019-07-03 NOTE — ED Notes (Signed)
Pt verbalized understanding of discharge paperwork, prescription and follow-up care. Pt discharged with cane, phone and clothes.

## 2019-07-03 NOTE — Progress Notes (Signed)
  Echocardiogram 2D Echocardiogram has been performed.  Jennette Dubin 07/03/2019, 11:54 AM

## 2019-07-06 ENCOUNTER — Encounter (HOSPITAL_COMMUNITY): Payer: Self-pay

## 2019-07-06 ENCOUNTER — Other Ambulatory Visit: Payer: Self-pay

## 2019-07-06 ENCOUNTER — Emergency Department (HOSPITAL_COMMUNITY): Payer: No Typology Code available for payment source

## 2019-07-06 ENCOUNTER — Emergency Department (HOSPITAL_COMMUNITY)
Admission: EM | Admit: 2019-07-06 | Discharge: 2019-07-07 | Disposition: A | Discharge status: Home / Self Care | Payer: No Typology Code available for payment source | Attending: Emergency Medicine | Admitting: Emergency Medicine

## 2019-07-06 DIAGNOSIS — I10 Essential (primary) hypertension: Secondary | ICD-10-CM | POA: Diagnosis not present

## 2019-07-06 DIAGNOSIS — R52 Pain, unspecified: Secondary | ICD-10-CM

## 2019-07-06 DIAGNOSIS — G8929 Other chronic pain: Secondary | ICD-10-CM | POA: Diagnosis not present

## 2019-07-06 DIAGNOSIS — Z9981 Dependence on supplemental oxygen: Secondary | ICD-10-CM | POA: Insufficient documentation

## 2019-07-06 DIAGNOSIS — F17210 Nicotine dependence, cigarettes, uncomplicated: Secondary | ICD-10-CM | POA: Diagnosis not present

## 2019-07-06 DIAGNOSIS — Z7982 Long term (current) use of aspirin: Secondary | ICD-10-CM | POA: Diagnosis not present

## 2019-07-06 DIAGNOSIS — I252 Old myocardial infarction: Secondary | ICD-10-CM | POA: Insufficient documentation

## 2019-07-06 DIAGNOSIS — J449 Chronic obstructive pulmonary disease, unspecified: Secondary | ICD-10-CM | POA: Diagnosis not present

## 2019-07-06 DIAGNOSIS — Z79899 Other long term (current) drug therapy: Secondary | ICD-10-CM | POA: Diagnosis not present

## 2019-07-06 DIAGNOSIS — M79671 Pain in right foot: Secondary | ICD-10-CM | POA: Insufficient documentation

## 2019-07-06 DIAGNOSIS — R0789 Other chest pain: Secondary | ICD-10-CM | POA: Diagnosis not present

## 2019-07-06 LAB — CBG MONITORING, ED: Glucose-Capillary: 180 mg/dL — ABNORMAL HIGH (ref 70–99)

## 2019-07-06 MED ORDER — ASPIRIN 81 MG PO CHEW
324.0000 mg | CHEWABLE_TABLET | Freq: Once | ORAL | Status: AC
  Administered 2019-07-07: 324 mg via ORAL
  Filled 2019-07-06: qty 4

## 2019-07-06 MED ORDER — HYDROMORPHONE HCL 1 MG/ML IJ SOLN
1.0000 mg | Freq: Once | INTRAMUSCULAR | Status: AC
  Administered 2019-07-06: 1 mg via INTRAMUSCULAR
  Filled 2019-07-06: qty 1

## 2019-07-06 MED ORDER — HYDROMORPHONE HCL 1 MG/ML IJ SOLN
INTRAMUSCULAR | Status: AC
  Filled 2019-07-06: qty 1

## 2019-07-06 NOTE — ED Notes (Signed)
Ambulated to bathroom with standby assist.

## 2019-07-06 NOTE — ED Provider Notes (Signed)
[facility] EMERGENCY DEPARTMENT Provider Note   CSN: [id number] Arrival date & time: 07/06/19  1845     History Chief Complaint  Patient presents with  . Foot Pain    Mario [name] is a 69 y.o. male with a history of COPD, diabetes mellitus type 2, HTN, anxiety, depression, OSA on CPAP, history of suicide attempt, morbid obesity, and positive HLA-B27 vasculitis, and CAD who presents to the emergency department with a chief complaint of right foot pain.  Patient reports that he has been having pain in his right foot over the last day.  He characterizes the pain as burning and feeling as if he is being stabbed in the great toe and medial aspect of the foot.  Pain is constant.  No known aggravating or alleviating factors.  No redness, warmth, fever, chills, numbness, weakness, redness, warmth, ankle pain, leg swelling, shortness of breath, or chest pain.  States that he has not tried any treatment prior to arrival because he does not have any medication at home other than muscle relaxers.  States that he was previously given 15 mg of Percocet for his symptoms.  He has not been seen by his rheumatologist syndrome because he does not have transportation to the [place].  He has been compliant with his home CellCept and methotrexate.  Reports that he was given a prescription of prednisone during a previous admission, but did not take it because he did not want his blood sugars to go up.  Later, the patient states that he did have 20 minutes of chest pain earlier today.  Pain is since resolved.  He has some home oxygen at night, but has not had to increase or wear his oxygen more frequently than usual.  Patient recently had an admission on 5/5 for cardiac work-up where he had an echo that showed no regional wall abnormalities and cardiology felt that no further ischemic work-up is indicated.  He should follow up with his cardiologist at the [facility] in the outpatient setting.    The history  is provided by the patient. No language interpreter was used.       Past Medical History:  Diagnosis Date  . Anxiety   . Bronchitis   . COPD (chronic obstructive pulmonary disease) ([place])   . Depression   . Hypertension   . Mental disorder   . MI (myocardial infarction) ([place])    ????  . OSA (obstructive sleep apnea)   . Suicide attempt ([place])   . Tension pneumothorax 06/27/2016    Patient Active Problem List   Diagnosis Date Noted  . COPD (chronic obstructive pulmonary disease) ([place])   . AKI (acute kidney injury) ([place])   . Hyperglycemia 06/23/2019  . OSA (obstructive sleep apnea) 06/23/2019  . Suicidal ideations 06/23/2019  . Major depressive disorder, recurrent episode ([place]) 10/12/2018  . 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 Cellulitis of both feet 07/16/2017  . DM2 (diabetes mellitus, type 2) ([place]) 07/16/2017  . HLA B27 (HLA B27 positive) 07/16/2017  . Acute chest pain   . Hyperkalemia   . Spontaneous pneumothorax 06/27/2016  . Hypoxia   . Pneumothorax on left   . Dyspnea 05/23/2014  . COPD exacerbation ([place]) 05/23/2014  . Malingering 01/21/2013  . Depression 01/11/2013  . Tobacco abuse 01/11/2013  . Obesity, unspecified 01/11/2013  . Chest pain 01/10/2013  . HTN (hypertension) 01/10/2013    Past Surgical History:  Procedure Laterality Date  . CHEST TUBE  INSERTION Left 06/27/2016  . cryptorchidism    . SKIN CANCER EXCISION         Family History  Problem Relation Age of Onset  . Dementia Father     Social History   Tobacco Use  . Smoking status: Current Every Day Smoker    Packs/day: 0.50    Years: 20.00    Pack years: 10.00    Types: Cigarettes  . Smokeless tobacco: Never Used  Substance Use Topics  . Alcohol use: No    Alcohol/week: 0.0 standard drinks    Comment: denies use of any drugs or alcohol  . Drug use: No    Home Medications Prior to Admission medications   Medication Sig Start Date  End Date Taking? Authorizing Provider  albuterol (PROAIR HFA) 108 (90 Base) MCG/ACT inhaler Inhale 2 puffs into the lungs every 6 (six) hours as needed for wheezing or shortness of breath.   Yes [provider]  aspirin EC 81 MG tablet Take 81 mg by mouth daily.   Yes [provider]  buPROPion (WELLBUTRIN XL) 300 MG 24 hr tablet Take 300 mg by mouth daily after breakfast.    Yes [provider]  busPIRone (BUSPAR) 15 MG tablet Take 15 mg by mouth in the morning.    Yes [provider]  calcium-vitamin D (OSCAL WITH D) 500-200 MG-UNIT tablet Take 1 tablet by mouth 2 (two) times daily with a meal.   Yes [provider]  Cholecalciferol (VITAMIN D-3) 25 MCG (1000 UT) CAPS Take 1,000 Units by mouth daily with breakfast.   Yes [provider]  clobetasol ointment (TEMOVATE) 9.51 % Apply 1 application topically See admin instructions. Apply a small amount to healed areas of legs 2 times a day as directed- avoid face and genitals   Yes [provider]  cyclobenzaprine (FLEXERIL) 10 MG tablet Take 10 mg by mouth at bedtime.   Yes [provider]  Dextran 70-Hypromellose (ARTIFICIAL TEARS PF OP) Place 1-2 drops into both eyes every 2 (two) hours as needed (for dryness).    Yes [provider]  diltiazem (CARDIZEM CD) 180 MG 24 hr capsule Take 180 mg by mouth daily.   Yes [provider]  docusate calcium (SURFAK) 240 MG capsule Take 240 mg by mouth daily.   Yes [provider]  DULoxetine (CYMBALTA) 60 MG capsule Take 1 capsule (60 mg total) by mouth 2 (two) times daily. Patient taking differently: Take 60 mg by mouth 2 (two) times daily.  07/21/17  Yes Lavina Hamman, MD  etanercept (ENBREL) 50 MG/ML injection Inject 50 mg into the skin every Wednesday.    Yes [provider]  folic acid (FOLVITE) 1 MG tablet Take 1 mg by mouth daily.   Yes [provider]  gabapentin (NEURONTIN) 300 MG  capsule Take 2 capsules (600 mg total) by mouth 2 (two) times daily. Patient taking differently: Take 900 mg by mouth at bedtime.  07/06/16  Yes Lars Pinks M, PA-C  lisinopril (PRINIVIL,ZESTRIL) 20 MG tablet Take 1 tablet (20 mg total) by mouth daily. For high blood pressure Patient taking differently: Take 20 mg by mouth daily.  01/21/13  Yes Mashburn, Marlane Hatcher, PA-C  lurasidone (LATUDA) 40 MG TABS tablet Take 40 mg by mouth daily after supper.   Yes [provider]  Melatonin 3 MG TABS Take 6 mg by mouth at bedtime.   Yes [provider]  methocarbamol (ROBAXIN) 500 MG tablet Take 500  mg by mouth 3 (three) times daily as needed (for muscular pain).   Yes [provider]  methotrexate (RHEUMATREX) 2.5 MG tablet Take 6 tablets (15 mg total) by mouth every Saturday. Hold until seen by rheumatology  Caution:Chemotherapy. Protect from light. Patient taking differently: Take 15 mg by mouth every Saturday. Caution:Chemotherapy. Protect from light. 07/22/17  Yes Lavina Hamman, MD  Multiple Vitamin (MULTIVITAMIN WITH MINERALS) TABS tablet Take 1 tablet by mouth daily.   Yes [provider]  mycophenolate (CELLCEPT) 250 MG capsule Take 1,500 mg by mouth 2 (two) times daily.   Yes [provider]  nitroGLYCERIN (NITROSTAT) 0.4 MG SL tablet Place 1 tablet (0.4 mg total) under the tongue every 5 (five) minutes as needed for chest pain. 07/03/19  Yes Danford, Suann Larry, MD  Oxcarbazepine (TRILEPTAL) 300 MG tablet Take 300 mg by mouth 3 (three) times daily.    Yes [provider]  Propylene Glycol (SYSTANE BALANCE) 0.6 % SOLN Place 1 drop into both eyes 4 (four) times daily.   Yes [provider]  sodium chloride (OCEAN) 0.65 % SOLN nasal spray Place 2 sprays into both nostrils 4 (four) times daily as needed for congestion.   Yes [provider]  Tiotropium Bromide-Olodaterol (STIOLTO RESPIMAT) 2.5-2.5 MCG/ACT AERS Inhale 2 puffs into  the lungs in the morning.   Yes [provider]  traZODone (DESYREL) 50 MG tablet Take 50-75 mg by mouth at bedtime.    Yes [provider]  vitamin C (ASCORBIC ACID) 500 MG tablet Take 500 mg by mouth 2 (two) times daily.    Yes [provider]  vitamin E (VITAMIN E) 180 MG (400 UNITS) capsule Take 400 Units by mouth daily.   Yes [provider]  OXYGEN Inhale 2 L/min into the lungs See admin instructions. 2 liters/min at bedtime with CPAP, during naps and all times of exertion    [provider]  PRESCRIPTION MEDICATION CPAP- At bedtime and during during any naps    [provider]    Allergies    Demerol [meperidine], Zocor [simvastatin], Beet [beta vulgaris], and Liver  Review of Systems   Review of Systems  Constitutional: Negative for appetite change and fever.  Respiratory: Negative for shortness of breath.   Cardiovascular: Positive for chest pain (resolved).  Gastrointestinal: Negative for abdominal pain, diarrhea, nausea and vomiting.  Genitourinary: Negative for dysuria.  Musculoskeletal: Positive for arthralgias and myalgias. Negative for back pain, gait problem, joint swelling, neck pain and neck stiffness.  Skin: Negative for color change, rash and wound.  Allergic/Immunologic: Negative for immunocompromised state.  Neurological: Negative for dizziness, syncope, weakness, numbness and headaches.  Psychiatric/Behavioral: Negative for confusion.    Physical Exam Updated Vital Signs BP 135/82 (BP Location: Left Arm)   Pulse 88   Temp 98 F (36.7 C) (Oral)   Resp 18   Ht _0  (1.88 m)   Wt (!) 150 kg   SpO2 94%   BMI 42.46 kg/m   Physical Exam Vitals and nursing note reviewed.  Constitutional:      Appearance: He is well-developed.     Comments: Well-appearing.  No acute distress.  HENT:     Head: Normocephalic.  Eyes:     Conjunctiva/sclera: Conjunctivae normal.  Cardiovascular:     Rate and Rhythm:  Normal rate and regular rhythm.     Heart sounds: No murmur.  Pulmonary:     Effort: Pulmonary effort is normal. No respiratory distress.  Breath sounds: No stridor. No wheezing, rhonchi or rales.     Comments: Lungs are clear to auscultation bilaterally. Chest:     Chest wall: No tenderness.  Abdominal:     General: There is no distension.     Palpations: Abdomen is soft.  Musculoskeletal:     Cervical back: Neck supple.     Comments: Chronic appearing lesions noted to the right foot.  DP and PT pulses are 2+ and symmetric.  Sensation is intact and equal throughout.  5/5 strength against resistance with dorsiflexion plantarflexion.  He has no focal tenderness to palpation.  Moves all digits independently.  There is no warmth or redness.  No drainage.  Skin:    General: Skin is warm and dry.  Neurological:     Mental Status: He is alert.  Psychiatric:        Behavior: Behavior normal.       ED Results / Procedures / Treatments   Labs (all labs ordered are listed, but only abnormal results are displayed) Labs Reviewed  CBC WITH DIFFERENTIAL/PLATELET - Abnormal; Notable for the following components:      Result Value   WBC 13.8 (*)    Neutro Abs 7.9 (*)    Lymphs Abs 4.5 (*)    Monocytes Absolute 1.1 (*)    All other components within normal limits  BASIC METABOLIC PANEL - Abnormal; Notable for the following components:   Glucose, Bld 166 (*)    Creatinine, Ser 1.39 (*)    GFR calc non Af Amer 52 (*)    All other components within normal limits  CBG MONITORING, ED - Abnormal; Notable for the following components:   Glucose-Capillary 180 (*)    All other components within normal limits  TROPONIN I (HIGH SENSITIVITY)  TROPONIN I (HIGH SENSITIVITY)    EKG EKG Interpretation  Date/Time:  Sunday Jul 07 2019 00:56:43 EDT Ventricular Rate:  93 PR Interval:  196 QRS Duration: 100 QT Interval:  350 QTC Calculation: 435 R Axis:   27 Text Interpretation: Normal sinus  rhythm Low voltage QRS Cannot rule out Anterior infarct , age undetermined Abnormal ECG No significant change was found Confirmed by Ezequiel Essex [phone]) on 07/07/2019 1:13:56 AM   Radiology DG Chest 1 View  Result Date: 07/07/2019 CLINICAL DATA:  Chronic right foot pain, vasculitis, COPD EXAM: CHEST  1 VIEW COMPARISON:  07/02/2019 FINDINGS: Single frontal view of the chest demonstrates a stable cardiac silhouette. Background emphysema and scarring unchanged. No acute airspace disease, effusion, or pneumothorax. IMPRESSION: 1. Stable emphysema, no acute process. Electronically Signed   By: Randa Ngo M.D.   On: 07/07/2019 00:27   DG Foot Complete Right  Result Date: 07/07/2019 CLINICAL DATA:  Chronic right foot pain, vasculitis EXAM: RIGHT FOOT COMPLETE - 3+ VIEW COMPARISON:  None. FINDINGS: Frontal, oblique, and lateral views of the right foot are obtained. No fracture, subluxation, or dislocation. Mild joint space narrowing and osteophyte formation within the midfoot and first metatarsophalangeal joint, compatible with osteoarthritis. Soft tissues are unremarkable. IMPRESSION: 1. Mild osteoarthritis.  No acute or destructive bony abnormalities. Electronically Signed   By: Randa Ngo M.D.   On: 07/07/2019 00:28    Procedures Procedures (including critical care time)  Medications Ordered in ED Medications  HYDROmorphone (DILAUDID) injection 1 mg (1 mg Intramuscular Given 07/06/19 2241)  aspirin chewable tablet 324 mg (324 mg Oral Given 07/07/19 0044)  oxyCODONE-acetaminophen (PERCOCET/ROXICET) 5-325 MG per tablet 2 tablet (2 tablets Oral Given 07/07/19 0120)  traMADol (ULTRAM) tablet 50 mg (50 mg Oral Given 07/07/19 0344)    ED Course  I have reviewed the triage vital signs and the nursing notes.  Pertinent labs & imaging results that were available during my care of the patient were reviewed by me and considered in my medical decision making (see chart for details).    MDM  Rules/Calculators/A&P                      69 year old male with a history of COPD, diabetes mellitus type 2, HTN, anxiety, depression, OSA on CPAP, history of suicide attempt, morbid obesity, and positive HLA-B27 vasculitis, and CAD who presents to the emergency department with a chief complaint of right foot pain.  He reports that he has a history of vasculitis and pain feels similar to a vasculitis flare.  He has been compliant with his on methotrexate and CellCept.  After I evaluated the patient, he was seen by Dr. Wyvonnia Dusky, attending physician.  Reports that he had a 20-minute episode of chest pain earlier in the day.  Dr. Wyvonnia Dusky is recommended cardiac work-up in the ER with serial troponins.  Patient was evaluated after Dilaudid was given, but was still endorsing severe pain so Percocet was ordered.  Exam of the right foot is unremarkable.  He does not appear to have a vasculitis flare.  However, given his complaints of pain, he has been given Dilaudid for pain control in the ER while awaiting cardiac work-up.  While awaiting cardiac work-up, nursing staff reports that the patient is caught multiple times for severe pain, tramadol was ordered.  On reevaluation, patient is now stating that the entire foot is numb, but sensation was previously intact on exam.  Sensation continues to be intact on repeat exam.  Discussed with the patient that he may benefit from gabapentin for neuropathic pain, but states "that medication is garbage and doesn't work."  Chest x-ray is unremarkable.  EKG with normal sinus rhythm.  Glucose is 166 with normal anion gap and bicarb.  Mild leukocytosis of 13.8, but this appears to be close to the patient's baseline.  Troponin is not elevated and trend is flat.  On reevaluation, patient reports significant improvement in pain in his foot.  Patient is able to stand and ambulate without difficulty.  Although he reports considerable control, he is now requesting admission for  pain control and feels that he needs inpatient pain management.  After discussing the patient at length with Dr. Wyvonnia Dusky, the patient is appropriate for discharge.  Inpatient admission is not indicated.  He was strongly advised to follow-up with the [place].  Advised that if he needs pain management, that he can receive a referral to his pain clinic from the [place].  Per chart review, patient has multiple ER visits for suicidal ideation and has reported plans to attempt to overdose on oxycodone.  He has a previous suicide attempt.  Given this history, will advise that the patient treat his symptoms with Tylenol and RICE therapy.  If pain persists, he was again, strongly urged to follow-up with his outpatient care team.  All questions answered.  He is hemodynamically stable and in no acute distress.  Safe for discharge home with outpatient follow-up.  Final Clinical Impression(s) / ED Diagnoses Final diagnoses:  Chronic foot pain, right  Atypical chest pain    Rx / DC Orders ED Discharge Orders    None       Neldon Shepard A, PA-C 07/07/19  [id number]    Ezequiel Essex, MD 07/07/19 [phone]

## 2019-07-06 NOTE — ED Triage Notes (Signed)
To triage via EMS.  Pt c/o chronic right foot pain d/t to vasculitis.

## 2019-07-06 NOTE — ED Triage Notes (Signed)
EMS BP 152/88 HR 114 RR 20 SpO2 96% on room air  CBG 145

## 2019-07-07 LAB — BASIC METABOLIC PANEL
Anion gap: 14 (ref 5–15)
BUN: 14 mg/dL (ref 8–23)
CO2: 24 mmol/L (ref 22–32)
Calcium: 9.4 mg/dL (ref 8.9–10.3)
Chloride: 99 mmol/L (ref 98–111)
Creatinine, Ser: 1.39 mg/dL — ABNORMAL HIGH (ref 0.61–1.24)
GFR calc Af Amer: >60 mL/min (ref >60)
GFR calc non Af Amer: 52 mL/min — ABNORMAL LOW (ref >60)
Glucose, Bld: 166 mg/dL — ABNORMAL HIGH (ref 70–99)
Potassium: 4.7 mmol/L (ref 3.5–5.1)
Sodium: 137 mmol/L (ref 135–145)

## 2019-07-07 LAB — CBC WITH DIFFERENTIAL/PLATELET
Abs Immature Granulocytes: 0.06 10*3/uL (ref 0.00–0.07)
Basophils Absolute: 0.1 10*3/uL (ref 0.0–0.1)
Basophils Relative: 0 %
Eosinophils Absolute: 0.2 10*3/uL (ref 0.0–0.5)
Eosinophils Relative: 1 %
HCT: 49.5 % (ref 39.0–52.0)
Hemoglobin: 16.2 g/dL (ref 13.0–17.0)
Immature Granulocytes: 0 %
Lymphocytes Relative: 33 %
Lymphs Abs: 4.5 10*3/uL — ABNORMAL HIGH (ref 0.7–4.0)
MCH: 32.7 pg (ref 26.0–34.0)
MCHC: 32.7 g/dL (ref 30.0–36.0)
MCV: 99.8 fL (ref 80.0–100.0)
Monocytes Absolute: 1.1 10*3/uL — ABNORMAL HIGH (ref 0.1–1.0)
Monocytes Relative: 8 %
Neutro Abs: 7.9 10*3/uL — ABNORMAL HIGH (ref 1.7–7.7)
Neutrophils Relative %: 58 %
Platelets: 309 10*3/uL (ref 150–400)
RBC: 4.96 MIL/uL (ref 4.22–5.81)
RDW: 13.1 % (ref 11.5–15.5)
WBC: 13.8 10*3/uL — ABNORMAL HIGH (ref 4.0–10.5)
nRBC: 0 % (ref 0.0–0.2)

## 2019-07-07 LAB — TROPONIN I (HIGH SENSITIVITY)
Troponin I (High Sensitivity): 5 ng/L (ref <18)
Troponin I (High Sensitivity): 6 ng/L (ref <18)

## 2019-07-07 MED ORDER — TRAMADOL HCL 50 MG PO TABS
50.0000 mg | ORAL_TABLET | Freq: Once | ORAL | Status: AC
  Administered 2019-07-07: 50 mg via ORAL
  Filled 2019-07-07: qty 1

## 2019-07-07 MED ORDER — OXYCODONE-ACETAMINOPHEN 5-325 MG PO TABS
2.0000 | ORAL_TABLET | Freq: Once | ORAL | Status: AC
  Administered 2019-07-07: 2 via ORAL
  Filled 2019-07-07: qty 2

## 2019-07-07 NOTE — ED Notes (Signed)
Dc instructions given to the pt. Pt verbalized feeling unhappy with care, states he consider he need to be admitted for pain control. Pt oriented that we tried all way to control his pain with no success, nothing abnormal seen on xray or labs that can let us admit him on the hospital that he  Needs to follow up with PCP as recommended by ED provider. Pt then wheeled out to the lobby. PT AO x 4, NAD noticed, able to walk on the hallway.

## 2019-07-07 NOTE — Discharge Instructions (Addendum)
Thank you for allowing me to care for you today in the Emergency Department.   Your work-up today in the ER was reassuring.  [facility].  You can also follow-up with your primary care provider at the [facility] if you require continued pain management.  Take 650 mg of Tylenol once every 6 hours.  You can apply an ice pack to areas that are painful for 15 to 20 minutes up to 3-4 times a day.  Try to elevate your left leg at or above the level of your heart to help with pain.  Return to the emergency department if your entire foot becomes red, swollen, starts have thick, mucus-like drainage, if you develop respiratory distress, high fevers, or other new, concerning symptoms.

## 2019-07-11 ENCOUNTER — Encounter (HOSPITAL_COMMUNITY): Payer: Self-pay | Admitting: *Deleted

## 2019-07-11 ENCOUNTER — Emergency Department (HOSPITAL_COMMUNITY)
Admission: EM | Admit: 2019-07-11 | Discharge: 2019-07-15 | Disposition: A | Discharge status: Home / Self Care | Payer: No Typology Code available for payment source | Attending: Emergency Medicine | Admitting: Emergency Medicine

## 2019-07-11 ENCOUNTER — Emergency Department (HOSPITAL_COMMUNITY): Payer: No Typology Code available for payment source

## 2019-07-11 DIAGNOSIS — E119 Type 2 diabetes mellitus without complications: Secondary | ICD-10-CM | POA: Diagnosis not present

## 2019-07-11 DIAGNOSIS — F418 Other specified anxiety disorders: Secondary | ICD-10-CM | POA: Insufficient documentation

## 2019-07-11 DIAGNOSIS — Z0489 Encounter for examination and observation for other specified reasons: Secondary | ICD-10-CM | POA: Diagnosis not present

## 2019-07-11 DIAGNOSIS — R45851 Suicidal ideations: Secondary | ICD-10-CM | POA: Diagnosis not present

## 2019-07-11 DIAGNOSIS — J449 Chronic obstructive pulmonary disease, unspecified: Secondary | ICD-10-CM | POA: Insufficient documentation

## 2019-07-11 DIAGNOSIS — F32A Depression, unspecified: Secondary | ICD-10-CM | POA: Diagnosis present

## 2019-07-11 DIAGNOSIS — I10 Essential (primary) hypertension: Secondary | ICD-10-CM | POA: Diagnosis not present

## 2019-07-11 DIAGNOSIS — Z20822 Contact with and (suspected) exposure to covid-19: Secondary | ICD-10-CM | POA: Diagnosis not present

## 2019-07-11 DIAGNOSIS — F17210 Nicotine dependence, cigarettes, uncomplicated: Secondary | ICD-10-CM | POA: Insufficient documentation

## 2019-07-11 DIAGNOSIS — R0602 Shortness of breath: Secondary | ICD-10-CM | POA: Diagnosis present

## 2019-07-11 DIAGNOSIS — F332 Major depressive disorder, recurrent severe without psychotic features: Secondary | ICD-10-CM | POA: Insufficient documentation

## 2019-07-11 LAB — CBC
HCT: 47.5 % (ref 39.0–52.0)
Hemoglobin: 15.3 g/dL (ref 13.0–17.0)
MCH: 32.3 pg (ref 26.0–34.0)
MCHC: 32.2 g/dL (ref 30.0–36.0)
MCV: 100.2 fL — ABNORMAL HIGH (ref 80.0–100.0)
Platelets: 314 10*3/uL (ref 150–400)
RBC: 4.74 MIL/uL (ref 4.22–5.81)
RDW: 13.1 % (ref 11.5–15.5)
WBC: 12 10*3/uL — ABNORMAL HIGH (ref 4.0–10.5)
nRBC: 0 % (ref 0.0–0.2)

## 2019-07-11 LAB — RAPID URINE DRUG SCREEN, HOSP PERFORMED
Amphetamines: NOT DETECTED
Barbiturates: NOT DETECTED
Benzodiazepines: NOT DETECTED
Cocaine: NOT DETECTED
Opiates: NOT DETECTED
Tetrahydrocannabinol: NOT DETECTED

## 2019-07-11 LAB — COMPREHENSIVE METABOLIC PANEL
ALT: 30 U/L (ref 0–44)
AST: 25 U/L (ref 15–41)
Albumin: 3.9 g/dL (ref 3.5–5.0)
Alkaline Phosphatase: 58 U/L (ref 38–126)
Anion gap: 10 (ref 5–15)
BUN: 16 mg/dL (ref 8–23)
CO2: 23 mmol/L (ref 22–32)
Calcium: 9.1 mg/dL (ref 8.9–10.3)
Chloride: 103 mmol/L (ref 98–111)
Creatinine, Ser: 1.55 mg/dL — ABNORMAL HIGH (ref 0.61–1.24)
GFR calc Af Amer: 53 mL/min — ABNORMAL LOW (ref >60)
GFR calc non Af Amer: 46 mL/min — ABNORMAL LOW (ref >60)
Glucose, Bld: 252 mg/dL — ABNORMAL HIGH (ref 70–99)
Potassium: 4.8 mmol/L (ref 3.5–5.1)
Sodium: 136 mmol/L (ref 135–145)
Total Bilirubin: 0.4 mg/dL (ref 0.3–1.2)
Total Protein: 6.7 g/dL (ref 6.5–8.1)

## 2019-07-11 LAB — ETHANOL: Alcohol, Ethyl (B): <10 mg/dL (ref <10)

## 2019-07-11 LAB — ACETAMINOPHEN LEVEL: Acetaminophen (Tylenol), Serum: <10 ug/mL — ABNORMAL LOW (ref 10–30)

## 2019-07-11 LAB — SALICYLATE LEVEL: Salicylate Lvl: <7 mg/dL — ABNORMAL LOW (ref 7.0–30.0)

## 2019-07-11 NOTE — ED Triage Notes (Signed)
To ED for eval of increased SOB today and feeling of SI. Pt states he can usually walk to one room to the next and feels fine but today he felt like he was suffocating. This makes pt feel SI. Also with newer nerve pain in right foot. [facility]. States he has a plan to take all his muscle relaxers. Pt is cooperative and calm.

## 2019-07-12 ENCOUNTER — Encounter (HOSPITAL_COMMUNITY): Payer: Self-pay | Admitting: Emergency Medicine

## 2019-07-12 LAB — TROPONIN I (HIGH SENSITIVITY)
Troponin I (High Sensitivity): 6 ng/L (ref <18)
Troponin I (High Sensitivity): 6 ng/L (ref <18)

## 2019-07-12 LAB — SARS CORONAVIRUS 2 BY RT PCR (HOSPITAL ORDER, PERFORMED IN ~~LOC~~ HOSPITAL LAB): SARS Coronavirus 2: NEGATIVE

## 2019-07-12 LAB — CBG MONITORING, ED: Glucose-Capillary: 335 mg/dL — ABNORMAL HIGH (ref 70–99)

## 2019-07-12 MED ORDER — GABAPENTIN 300 MG PO CAPS
900.0000 mg | ORAL_CAPSULE | Freq: Every day | ORAL | Status: DC
  Administered 2019-07-12 – 2019-07-14 (×3): 900 mg via ORAL
  Filled 2019-07-12 (×3): qty 3

## 2019-07-12 MED ORDER — BUSPIRONE HCL 10 MG PO TABS
15.0000 mg | ORAL_TABLET | Freq: Every day | ORAL | Status: DC
  Administered 2019-07-12 – 2019-07-15 (×4): 15 mg via ORAL
  Filled 2019-07-12 (×4): qty 2

## 2019-07-12 MED ORDER — CYCLOBENZAPRINE HCL 10 MG PO TABS
10.0000 mg | ORAL_TABLET | Freq: Every day | ORAL | Status: DC
  Administered 2019-07-12 – 2019-07-14 (×3): 10 mg via ORAL
  Filled 2019-07-12 (×4): qty 1

## 2019-07-12 MED ORDER — DILTIAZEM HCL ER COATED BEADS 180 MG PO CP24
180.0000 mg | ORAL_CAPSULE | Freq: Every day | ORAL | Status: DC
  Administered 2019-07-12 – 2019-07-15 (×4): 180 mg via ORAL
  Filled 2019-07-12 (×4): qty 1

## 2019-07-12 MED ORDER — GABAPENTIN 100 MG PO CAPS
300.0000 mg | ORAL_CAPSULE | Freq: Once | ORAL | Status: AC
  Administered 2019-07-12: 300 mg via ORAL
  Filled 2019-07-12: qty 3

## 2019-07-12 MED ORDER — MELATONIN 3 MG PO TABS
6.0000 mg | ORAL_TABLET | Freq: Every day | ORAL | Status: DC
  Administered 2019-07-12 – 2019-07-14 (×3): 6 mg via ORAL
  Filled 2019-07-12 (×3): qty 2

## 2019-07-12 MED ORDER — LURASIDONE HCL 40 MG PO TABS
40.0000 mg | ORAL_TABLET | Freq: Every day | ORAL | Status: DC
  Administered 2019-07-12 – 2019-07-14 (×3): 40 mg via ORAL
  Filled 2019-07-12 (×4): qty 1

## 2019-07-12 MED ORDER — DULOXETINE HCL 60 MG PO CPEP
60.0000 mg | ORAL_CAPSULE | Freq: Two times a day (BID) | ORAL | Status: DC
  Administered 2019-07-12 – 2019-07-15 (×7): 60 mg via ORAL
  Filled 2019-07-12 (×8): qty 1

## 2019-07-12 MED ORDER — IBUPROFEN 400 MG PO TABS
400.0000 mg | ORAL_TABLET | Freq: Four times a day (QID) | ORAL | Status: DC | PRN
  Administered 2019-07-13 – 2019-07-14 (×3): 400 mg via ORAL
  Filled 2019-07-12 (×4): qty 1

## 2019-07-12 MED ORDER — POLYVINYL ALCOHOL 1.4 % OP SOLN
1.0000 [drp] | Freq: Four times a day (QID) | OPHTHALMIC | Status: DC
  Administered 2019-07-12 – 2019-07-15 (×7): 1 [drp] via OPHTHALMIC
  Filled 2019-07-12: qty 15

## 2019-07-12 MED ORDER — BUPROPION HCL ER (XL) 150 MG PO TB24
300.0000 mg | ORAL_TABLET | Freq: Every day | ORAL | Status: DC
  Administered 2019-07-12 – 2019-07-15 (×4): 300 mg via ORAL
  Filled 2019-07-12 (×4): qty 2

## 2019-07-12 MED ORDER — OXCARBAZEPINE 300 MG PO TABS
300.0000 mg | ORAL_TABLET | Freq: Three times a day (TID) | ORAL | Status: DC
  Administered 2019-07-12 – 2019-07-15 (×10): 300 mg via ORAL
  Filled 2019-07-12 (×10): qty 1

## 2019-07-12 MED ORDER — ALBUTEROL SULFATE HFA 108 (90 BASE) MCG/ACT IN AERS
2.0000 | INHALATION_SPRAY | Freq: Four times a day (QID) | RESPIRATORY_TRACT | Status: DC
  Administered 2019-07-12: 2 via RESPIRATORY_TRACT
  Filled 2019-07-12: qty 6.7

## 2019-07-12 MED ORDER — METHOCARBAMOL 500 MG PO TABS
500.0000 mg | ORAL_TABLET | Freq: Three times a day (TID) | ORAL | Status: DC | PRN
  Administered 2019-07-13 – 2019-07-14 (×2): 500 mg via ORAL
  Filled 2019-07-12 (×2): qty 1

## 2019-07-12 MED ORDER — DOCUSATE SODIUM 100 MG PO CAPS
200.0000 mg | ORAL_CAPSULE | Freq: Every day | ORAL | Status: DC
  Administered 2019-07-12 – 2019-07-15 (×4): 200 mg via ORAL
  Filled 2019-07-12 (×4): qty 2

## 2019-07-12 MED ORDER — ALBUTEROL SULFATE HFA 108 (90 BASE) MCG/ACT IN AERS
4.0000 | INHALATION_SPRAY | Freq: Once | RESPIRATORY_TRACT | Status: AC
  Administered 2019-07-12: 4 via RESPIRATORY_TRACT
  Filled 2019-07-12: qty 6.7

## 2019-07-12 MED ORDER — LISINOPRIL 20 MG PO TABS
20.0000 mg | ORAL_TABLET | Freq: Every day | ORAL | Status: DC
  Administered 2019-07-12 – 2019-07-15 (×4): 20 mg via ORAL
  Filled 2019-07-12 (×4): qty 1

## 2019-07-12 MED ORDER — VITAMIN D 25 MCG (1000 UNIT) PO TABS
1000.0000 [IU] | ORAL_TABLET | Freq: Every day | ORAL | Status: DC
  Administered 2019-07-13 – 2019-07-15 (×3): 1000 [IU] via ORAL
  Filled 2019-07-12 (×3): qty 1

## 2019-07-12 MED ORDER — PREDNISONE 20 MG PO TABS
60.0000 mg | ORAL_TABLET | Freq: Once | ORAL | Status: AC
  Administered 2019-07-12: 60 mg via ORAL
  Filled 2019-07-12: qty 3

## 2019-07-12 MED ORDER — TRAZODONE HCL 50 MG PO TABS
50.0000 mg | ORAL_TABLET | Freq: Every day | ORAL | Status: DC
  Administered 2019-07-12: 50 mg via ORAL
  Filled 2019-07-12: qty 2

## 2019-07-12 MED ORDER — ACETAMINOPHEN 500 MG PO TABS
1000.0000 mg | ORAL_TABLET | Freq: Once | ORAL | Status: AC
  Administered 2019-07-12: 1000 mg via ORAL
  Filled 2019-07-12: qty 2

## 2019-07-12 MED ORDER — ASCORBIC ACID 500 MG PO TABS
500.0000 mg | ORAL_TABLET | Freq: Two times a day (BID) | ORAL | Status: DC
  Administered 2019-07-12 – 2019-07-15 (×7): 500 mg via ORAL
  Filled 2019-07-12 (×7): qty 1

## 2019-07-12 MED ORDER — SALINE SPRAY 0.65 % NA SOLN: 2.0000 | Freq: Four times a day (QID) | NASAL | Status: DC | PRN

## 2019-07-12 MED ORDER — VITAMIN E 180 MG (400 UNIT) PO CAPS
400.0000 [IU] | ORAL_CAPSULE | Freq: Every day | ORAL | Status: DC
  Administered 2019-07-13 – 2019-07-15 (×3): 400 [IU] via ORAL
  Filled 2019-07-12 (×4): qty 1

## 2019-07-12 MED ORDER — IPRATROPIUM-ALBUTEROL 0.5-2.5 (3) MG/3ML IN SOLN
3.0000 mL | RESPIRATORY_TRACT | Status: DC
  Filled 2019-07-12: qty 3

## 2019-07-12 MED ORDER — ASPIRIN EC 81 MG PO TBEC
81.0000 mg | DELAYED_RELEASE_TABLET | Freq: Every day | ORAL | Status: DC
  Administered 2019-07-12 – 2019-07-15 (×4): 81 mg via ORAL
  Filled 2019-07-12 (×4): qty 1

## 2019-07-12 MED ORDER — MYCOPHENOLATE MOFETIL 250 MG PO CAPS
1500.0000 mg | ORAL_CAPSULE | Freq: Two times a day (BID) | ORAL | Status: DC
  Administered 2019-07-12 – 2019-07-15 (×7): 1500 mg via ORAL
  Filled 2019-07-12 (×9): qty 6

## 2019-07-12 MED ORDER — METFORMIN HCL 500 MG PO TABS
500.0000 mg | ORAL_TABLET | Freq: Once | ORAL | Status: AC
  Administered 2019-07-12: 500 mg via ORAL
  Filled 2019-07-12: qty 1

## 2019-07-12 MED ORDER — ALBUTEROL SULFATE HFA 108 (90 BASE) MCG/ACT IN AERS: 2.0000 | INHALATION_SPRAY | Freq: Four times a day (QID) | RESPIRATORY_TRACT | Status: DC | PRN

## 2019-07-12 NOTE — ED Notes (Signed)
Breakfast Tray Ordered @ [id number].

## 2019-07-12 NOTE — Progress Notes (Signed)
RT switched patient from nebulizer treatments to an inhaler due to patient being held in the hallway. First dose is scheduled at 2000.

## 2019-07-12 NOTE — ED Notes (Signed)
Pt unable to change into scrubs due to pt needing a 4X and size is unavailable.

## 2019-07-12 NOTE — ED Notes (Signed)
Breakfast tray given. °

## 2019-07-12 NOTE — ED Notes (Signed)
The person is threatening to leave since he is iun the hallway  Dr ray has ivcd him but the pt is getting more upset

## 2019-07-12 NOTE — ED Notes (Signed)
Pt placed on 2 L of O2; -Monique,RN

## 2019-07-12 NOTE — [facility] Counselor (Signed)
Disposition: Dr. Dwyane Dee and Priscille Loveless, PMHNP recommend gero-psych once medically cleared.

## 2019-07-12 NOTE — ED Notes (Signed)
MD Ray at bedside speaking w/ pt and informing him of plan. Pt continues to ask for medication for pain. MD Ray made multiple offers and pt refused stating he wanted a shot of dilaudid. MD Ray informed him that would not be part of his treatment plan. Pt then stated he was no longer suicidal and wishes to leave as he is not receiving a shot of narcotics. MD Ray informs pt that he is IVC'd and he is unable to leave at this time. Pt becoming belligerent and agitated. Demanding that [facility] be called back to re-evaluate. This RN spoke w/ counselor over at [facility] and they stated based on his exam this afternoon, they still recommend inpt psych placement, and will re-eval in the AM. Plan communicated to pt. Pt is agitated and cursing, but voices understanding.

## 2019-07-12 NOTE — ED Notes (Signed)
Assisted to bathroom

## 2019-07-12 NOTE — ED Notes (Signed)
Pt belongings placed in locker #3 and pt valuables given to security.

## 2019-07-12 NOTE — ED Provider Notes (Signed)
69 year old male who was seen and evaluated during the night last night.  He came in complaining of being suicidal.  He is also complaining of chronic leg pain.  He has some cough here in the department and received a dose of prednisone.  Subsequently, he had elevated blood sugars.  He is being restarted on Metformin which she has been on previously.  This is likely to be able to be stopped once the prednisone is worn off.  His respiratory status has been stable here in the ED. He has been seen and evaluated by psych and advised to have inpatient [facility] psych hospitalization. Patient is medically cleared for psychiatric care. Patient is also now threatening to leave.  He is endorsed being suicidal and IVC patient papers are initiated   Pattricia Boss, MD 07/12/19 1610

## 2019-07-12 NOTE — ED Provider Notes (Addendum)
Emergency Department Provider Note  I have reviewed the triage vital signs and the nursing notes.  HISTORY  Chief Complaint Shortness of Breath and Medical Clearance   HPI Mario [name] is a 69 y.o. male with multimedical problems documented below who presents the emergency department today secondary to multiple complaints.  #1 patient states he is suicidal.  Patient states he does not his living situation, health problems and has also personal problems.  He is thought about overdosing on muscle relaxers.  He has tried overdose on narcotic pain medication in the past.  States he would like some help with this he is here voluntarily currently.  He also states that he has had shortness of breath.  Similar to previous COPD exacerbation start around 3:00 in afternoon.  Is severe dyspnea with any ambulation and better with rest.  No significant chest pain associate with this as some cough is not productive.  No fevers.  No lower extremity swelling.  Patient also states that he has right foot pain.  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  Patient states that his medications at home do not work.  Is been going on for very long time and has not changed recently but is severe in nature.  No swelling in his leg or no recent trauma.   No other associated or modifying symptoms.    Past Medical History:  Diagnosis Date  . Anxiety   . Bronchitis   . COPD (chronic obstructive pulmonary disease) ([place])   . Depression   . Hypertension   . Mental disorder   . MI (myocardial infarction) ([place])    ????  . OSA (obstructive sleep apnea)   . Suicide attempt ([place])   . Tension pneumothorax 06/27/2016    Patient Active Problem List   Diagnosis Date Noted  . COPD (chronic obstructive pulmonary disease) ([place])   . AKI (acute kidney injury) ([place])   . Hyperglycemia 06/23/2019  . OSA (obstructive sleep apnea) 06/23/2019  . Suicidal ideations 06/23/2019  . Major  depressive disorder, recurrent episode ([place]) 10/12/2018  . 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 Cellulitis of both feet 07/16/2017  . DM2 (diabetes mellitus, type 2) ([place]) 07/16/2017  . HLA B27 (HLA B27 positive) 07/16/2017  . Acute chest pain   . Hyperkalemia   . Spontaneous pneumothorax 06/27/2016  . Hypoxia   . Pneumothorax on left   . Dyspnea 05/23/2014  . COPD exacerbation ([place]) 05/23/2014  . Malingering 01/21/2013  . Depression 01/11/2013  . Tobacco abuse 01/11/2013  . Obesity, unspecified 01/11/2013  . Chest pain 01/10/2013  . HTN (hypertension) 01/10/2013    Past Surgical History:  Procedure Laterality Date  . CHEST TUBE INSERTION Left 06/27/2016  . cryptorchidism    . SKIN CANCER EXCISION      Current Outpatient Rx  . Order #: [id number] Class: Historical Med  . Order #: [id number] Class: Historical Med  . Order #: [id number] Class: Historical Med  . Order #: [id number] Class: Historical Med  . Order #: [id number] Class: Historical Med  . Order #: [id number] Class: Historical Med  . Order #: [id number] Class: Historical Med  . Order #: [id number] Class: Historical Med  . Order #: [id number] Class: Historical Med  . Order #: [id number] Class: Historical Med  . Order #: [id number] Class: Historical Med  . Order #: [id number] Class: Print  . Order #: [id number] Class: Historical Med  . Order #: [id number] Class: Historical Med  . Order #: [id number] Class: Print  .  Order #: [id number] Class: No Print  . Order #: [id number] Class: Historical Med  . Order #: [id number] Class: Historical Med  . Order #: [id number] Class: Historical Med  . Order #: [id number] Class: No Print  . Order #: [id number] Class: Historical Med  . Order #: [id number] Class: Historical Med  . Order #: [id number] Class: Normal  . Order #: [id number] Class: Historical Med  . Order #: [id number] Class: Historical Med  . Order #: [id number] Class: Historical Med  . Order #: [id number] Class: Historical  Med  . Order #: [id number] Class: Historical Med  . Order #: [id number] Class: Historical Med  . Order #: [id number] Class: Historical Med  . Order #: [id number] Class: Historical Med  . Order #: [id number] Class: Historical Med    Allergies Demerol [meperidine], Zocor [simvastatin], Beet [beta vulgaris], and Liver  Family History  Problem Relation Age of Onset  . Dementia Father     Social History Social History   Tobacco Use  . Smoking status: Current Every Day Smoker    Packs/day: 0.50    Years: 20.00    Pack years: 10.00    Types: Cigarettes  . Smokeless tobacco: Never Used  Substance Use Topics  . Alcohol use: No    Alcohol/week: 0.0 standard drinks    Comment: denies use of any drugs or alcohol  . Drug use: No    Review of Systems  All other systems negative except as documented in the HPI. All pertinent positives and negatives as reviewed in the HPI. ____________________________________________  PHYSICAL EXAM:  VITAL SIGNS: ED Triage Vitals  Enc Vitals Group     BP 07/11/19 2012 125/85     Pulse Rate 07/11/19 2012 (!) 107     Resp 07/11/19 2012 18     Temp 07/11/19 2012 98.6 F (37 C)     Temp Source 07/11/19 2012 Oral     SpO2 07/11/19 2012 94 %    Constitutional: Alert and oriented. Well appearing and in no acute distress. Eyes: Conjunctivae are normal. PERRL. EOMI. Head: Atraumatic. Nose: No congestion/rhinnorhea. Mouth/Throat: Mucous membranes are moist.  Oropharynx non-erythematous. Neck: No stridor.  No meningeal signs.   Cardiovascular: Normal rate, regular rhythm. Good peripheral circulation. Grossly normal heart sounds.   Respiratory: tachypneic respiratory effort.  No retractions. Lungs diminished with bilateral wheezing. Gastrointestinal: Soft and nontender. No distention.  Musculoskeletal: No lower extremity tenderness nor edema. No gross deformities of extremities. Neurologic:  Normal speech and language. No gross focal neurologic deficits are  appreciated.  Skin:  Skin is warm, dry and intact. No rash noted.  ____________________________________________   LABS (all labs ordered are listed, but only abnormal results are displayed)  Labs Reviewed  COMPREHENSIVE METABOLIC PANEL - Abnormal; Notable for the following components:      Result Value   Glucose, Bld 252 (*)    Creatinine, Ser 1.55 (*)    GFR calc non Af Amer 46 (*)    GFR calc Af Amer 53 (*)    All other components within normal limits  SALICYLATE LEVEL - Abnormal; Notable for the following components:   Salicylate Lvl <9.4 (*)    All other components within normal limits  ACETAMINOPHEN LEVEL - Abnormal; Notable for the following components:   Acetaminophen (Tylenol), Serum <10 (*)    All other components within normal limits  CBC - Abnormal; Notable for the following components:   WBC 12.0 (*)    MCV 100.2 (*)    All other components within normal limits  SARS CORONAVIRUS 2 BY RT PCR (HOSPITAL ORDER, PERFORMED IN  [facility])  ETHANOL  RAPID URINE DRUG SCREEN, HOSP PERFORMED  TROPONIN I (HIGH SENSITIVITY)  TROPONIN I (HIGH SENSITIVITY)   ____________________________________________  EKG   EKG Interpretation  Date/Time:  Thursday Jul 11 2019 20:43:05 EDT Ventricular Rate:  103 PR Interval:  184 QRS Duration: 112 QT Interval:  348 QTC Calculation: 455 R Axis:   54 Text Interpretation: Sinus tachycardia Otherwise normal ECG No significant change since last tracing Confirmed by Merrily Pew [phone]) on 07/12/2019 1:56:54 AM       ____________________________________________  RADIOLOGY  DG Chest 2 View  Result Date: 07/11/2019 CLINICAL DATA:  Shortness of breath for 4 hours EXAM: CHEST - 2 VIEW COMPARISON:  CT 09/17/2016, radiograph 07/06/2019 FINDINGS: Chronic bandlike reticular opacities in the lung bases compatible with scarring. Additional streaky basilar opacities likely reflective of atelectasis and interstitial change seen on  comparison with relative apical emphysematous lucency as seen on prior CT. No consolidation, features of edema, pneumothorax, or effusion. Cardiomediastinal contours are stable from prior. No acute osseous or soft tissue abnormality. Degenerative changes are present in the imaged spine and shoulders. IMPRESSION: 1. No acute cardiopulmonary abnormality. 2. Chronic scarring in the lung bases. 3. Streaky basilar opacities likely reflect atelectasis and interstitial change seen on comparison studies. 4. Emphysematous changes in the lung apices better seen on prior CT. Electronically Signed   By: Lovena Le M.D.   On: 07/11/2019 21:10   ____________________________________________  PROCEDURES  Procedure(s) performed:   Procedures ____________________________________________  INITIAL IMPRESSION / ASSESSMENT AND PLAN / ED COURSE   This patient presents to the ED for concern of breath, foot pain and suicidality, this involves an extensive number of treatment options, and is a complaint that carries with it a high risk of complications and morbidity.  The differential diagnosis includes foot pain is chronic and not in need of working up today.  Shortness of breath could be related to COPD versus CHF versus pneumonia.  At this time based on physical exam and his history most likely COPD will treat for same.  Will add on troponin and EKG to make sure there is no cardiology issues.  If we get his breathing better and he is not hypoxic or need hospital admission will consult psychiatry.  If patient's breathing does not improve may need to admit him for further breathing treatments prior to psychiatric consultation.  Patient is refusing further breathing treatments. Not tachypneic. No resp distress. Medically cleared for tts consultation at this time. Home meds ordered.   Still asking for pain medicine for chronic foot pain. Tylenol/neurontin ordered.   If patient tried to leave prior to TTS consultation,  would suggest IVC.  ____________________________________________  FINAL CLINICAL IMPRESSION(S) / ED DIAGNOSES  Final diagnoses:  None    MEDICATIONS GIVEN DURING THIS VISIT:  Medications  ipratropium-albuterol (DUONEB) 0.5-2.5 (3) MG/3ML nebulizer solution 3 mL (3 mLs Nebulization Refused 07/12/19 0354)  albuterol (VENTOLIN HFA) 108 (90 Base) MCG/ACT inhaler 2 puff (has no administration in time range)  aspirin EC tablet 81 mg (has no administration in time range)  buPROPion (WELLBUTRIN XL) 24 hr tablet 300 mg (has no administration in time range)  busPIRone (BUSPAR) tablet 15 mg (has no administration in time range)  cholecalciferol (VITAMIN D3) tablet 1,000 Units (has no administration in time range)  DULoxetine (CYMBALTA) DR capsule 60 mg (has no administration in time range)  docusate sodium (COLACE) capsule 200 mg (has no administration in time range)  diltiazem (CARDIZEM CD) 24 hr  capsule 180 mg (has no administration in time range)  cyclobenzaprine (FLEXERIL) tablet 10 mg (has no administration in time range)  gabapentin (NEURONTIN) capsule 900 mg (has no administration in time range)  lisinopril (ZESTRIL) tablet 20 mg (has no administration in time range)  lurasidone (LATUDA) tablet 40 mg (has no administration in time range)  melatonin tablet 6 mg (has no administration in time range)  methocarbamol (ROBAXIN) tablet 500 mg (has no administration in time range)  mycophenolate (CELLCEPT) capsule 1,500 mg (has no administration in time range)  Oxcarbazepine (TRILEPTAL) tablet 300 mg (has no administration in time range)  polyvinyl alcohol (LIQUIFILM TEARS) 1.4 % ophthalmic solution 1 drop (has no administration in time range)  sodium chloride (OCEAN) 0.65 % nasal spray 2 spray (has no administration in time range)  traZODone (DESYREL) tablet 50-75 mg (has no administration in time range)  ascorbic acid (VITAMIN C) tablet 500 mg (has no administration in time range)  vitamin E  capsule 400 Units (has no administration in time range)  albuterol (VENTOLIN HFA) 108 (90 Base) MCG/ACT inhaler 4 puff (4 puffs Inhalation Given 07/12/19 0124)  predniSONE (DELTASONE) tablet 60 mg (60 mg Oral Given 07/12/19 0124)  acetaminophen (TYLENOL) tablet 1,000 mg (1,000 mg Oral Given 07/12/19 0423)  gabapentin (NEURONTIN) capsule 300 mg (300 mg Oral Given 07/12/19 0423)    NEW OUTPATIENT MEDICATIONS STARTED DURING THIS VISIT:  New Prescriptions   No medications on file    Note:  This note was prepared with assistance of Dragon voice recognition software. Occasional wrong-word or sound-a-like substitutions may have occurred due to the inherent limitations of voice recognition software.   Shiva Karis, Corene Cornea, MD 07/12/19 [id number]    Merrily Pew, MD 07/12/19 [phone]

## 2019-07-12 NOTE — BH Assessment (Signed)
Assessment Note  Mario [name] is an 69 y.o. male presenting voluntarily to [facility] ED with multiple complaints including SOB, nerve pain, and repeated suicidal ideation secondary to chronic pain. Patient reports SI with a plan to overdose on muscle relaxer's, which he states he has at home. Patient identifies several life stressors including not liking his residence, no natural supports, and chronic pain. Patient is seen at the [facility] for medication management. He does not currently have an outpatient therapist. Patient denies HI/AVH, substance use, or trauma history.   Patient is alert and oriented x 4. He is dressed in a hospital gown. His speech is logical, eye contact is good, and thoughts are organized. Patient's mood is depressed/irritable and his affect is congruent. He has limited insight, judgement, and impulse control. He does not appear to be responding to internal stimuli or experiencing delusional thought content.  Diagnosis: F33.2 MDD, recurrent, severe  Past Medical History:  Past Medical History:  Diagnosis Date  . Anxiety   . Bronchitis   . COPD (chronic obstructive pulmonary disease) ([place])   . Depression   . Hypertension   . Mental disorder   . MI (myocardial infarction) ([place])    ????  . OSA (obstructive sleep apnea)   . Suicide attempt ([place])   . Tension pneumothorax 06/27/2016    Past Surgical History:  Procedure Laterality Date  . CHEST TUBE INSERTION Left 06/27/2016  . cryptorchidism    . SKIN CANCER EXCISION      Family History:  Family History  Problem Relation Age of Onset  . Dementia Father     Social History:  reports that he has been smoking cigarettes. He has a 10.00 pack-year smoking history. He has never used smokeless tobacco. He reports that he does not drink alcohol or use drugs.  Additional Social History:  Alcohol / Drug Use Pain Medications: see MAR Prescriptions: see MAR Over the Counter: see MAR History of alcohol / drug use?: No history of  alcohol / drug abuse  CIWA: CIWA-Ar BP: 135/80 Pulse Rate: 97 COWS:    Allergies:  Allergies  Allergen Reactions  . Demerol [Meperidine] Nausea And Vomiting and Other (See Comments)    Made the patient "violently sick"  . Zocor [Simvastatin] Nausea And Vomiting and Other (See Comments)    Made him very jittery, also  . Beet [Beta Vulgaris] Nausea And Vomiting  . Liver Nausea And Vomiting    Home Medications: (Not in a hospital admission)   OB/GYN Status:  No LMP for male patient.  General Assessment Data Location of Assessment: [facility] ED TTS Assessment: In system Is this a Tele or Face-to-Face Assessment?: Face-to-Face Is this an Initial Assessment or a Re-assessment for this encounter?: Initial Assessment Patient Accompanied by:: N/A Language Other than English: No Living Arrangements: (private residence) What gender do you identify as?: Male Marital status: Single Maiden name: Sisler Pregnancy Status: No Living Arrangements: Non-relatives/Friends Can pt return to current living arrangement?: Yes Admission Status: Voluntary Is patient capable of signing voluntary admission?: Yes Referral Source: Self/Family/Friend Insurance type: [place] Living Arrangements: Non-relatives/Friends Legal Guardian: (self) Name of Psychiatrist: [place] Name of Therapist: none  Education Status Is patient currently in school?: No Is the patient employed, unemployed or receiving disability?: Receiving disability income, Unemployed  Risk to self with the past 6 months Suicidal Ideation: Yes-Currently Present Has patient been a risk to self within the past 6 months prior to admission? : Yes Suicidal Intent:  Yes-Currently Present Has patient had any suicidal intent within the past 6 months prior to admission? : Yes Is patient at risk for suicide?: Yes Suicidal Plan?: Yes-Currently Present Has patient had any suicidal plan within the past 6 months prior to  admission? : Yes Specify Current Suicidal Plan: overdose on muscle relaxers Access to Means: Yes Specify Access to Suicidal Means: states has a bottle at home What has been your use of drugs/alcohol within the last 12 months?: denies Previous Attempts/Gestures: No How many times?: 0 Other Self Harm Risks: denies Triggers for Past Attempts: None known Intentional Self Injurious Behavior: None Family Suicide History: No Recent stressful life event(s): Recent negative physical changes Persecutory voices/beliefs?: No Depression: Yes Depression Symptoms: Despondent, Insomnia, Isolating, Tearfulness, Fatigue, Loss of interest in usual pleasures, Guilt, Feeling worthless/self pity, Feeling angry/irritable Substance abuse history and/or treatment for substance abuse?: No Suicide prevention information given to non-admitted patients: Not applicable  Risk to Others within the past 6 months Homicidal Ideation: No Does patient have any lifetime risk of violence toward others beyond the six months prior to admission? : No Thoughts of Harm to Others: No Current Homicidal Intent: No Current Homicidal Plan: No Access to Homicidal Means: No Identified Victim: denies History of harm to others?: No Assessment of Violence: None Noted Violent Behavior Description: denies Does patient have access to weapons?: No Criminal Charges Pending?: No Does patient have a court date: No Is patient on probation?: No  Psychosis Hallucinations: None noted Delusions: None noted  Mental Status Report Appearance/Hygiene: In hospital gown Eye Contact: Good Motor Activity: Freedom of movement Speech: Logical/coherent Level of Consciousness: Alert Mood: Depressed, Irritable Affect: Depressed Anxiety Level: Moderate Thought Processes: Coherent, Relevant Judgement: Partial Orientation: Person, Time, Place, Situation Obsessive Compulsive Thoughts/Behaviors: None  Cognitive Functioning Concentration:  Normal Memory: Recent Intact, Remote Intact Is patient IDD: No Insight: Fair Impulse Control: Fair Appetite: Good Have you had any weight changes? : No Change Sleep: No Change Total Hours of Sleep: 8 Vegetative Symptoms: None  ADLScreening [facility] Assessment Services) Patient's cognitive ability adequate to safely complete daily activities?: Yes Patient able to express need for assistance with ADLs?: Yes Independently performs ADLs?: Yes (appropriate for developmental age)  Prior Inpatient Therapy Prior Inpatient Therapy: No  Prior Outpatient Therapy Prior Outpatient Therapy: Yes Prior Therapy Dates: ongoing [facility] Reason for Treatment: med management Does patient have an ACCT team?: No Does patient have Intensive In-House Services?  : No Does patient have [facility] services? : No Does patient have P4CC services?: No  ADL Screening (condition at time of admission) Patient's cognitive ability adequate to safely complete daily activities?: Yes Is the patient deaf or have difficulty hearing?: No Does the patient have difficulty seeing, even when wearing glasses/contacts?: No Does the patient have difficulty concentrating, remembering, or making decisions?: No Patient able to express need for assistance with ADLs?: Yes Does the patient have difficulty dressing or bathing?: No Independently performs ADLs?: Yes (appropriate for developmental age) Does the patient have difficulty walking or climbing stairs?: No Weakness of Legs: None Weakness of Arms/Hands: None  Home Assistive Devices/Equipment Home Assistive Devices/Equipment: Cane (specify quad or straight), CPAP, Oxygen  Therapy Consults (therapy consults require a physician order) PT Evaluation Needed: No OT Evalulation Needed: No SLP Evaluation Needed: No Abuse/Neglect Assessment (Assessment to be complete while patient is alone) Physical Abuse: Denies Verbal Abuse: Denies Sexual Abuse:  Denies Exploitation of patient/patient's resources: Denies Self-Neglect: Denies Values / Beliefs Cultural Requests During Hospitalization: None  Spiritual Requests During Hospitalization: None Consults Spiritual Care Consult Needed: No Transition of Care Team Consult Needed: No Advance Directives (For Healthcare) Does Patient Have a Medical Advance Directive?: No Would patient like information on creating a medical advance directive?: No - Patient declined          Disposition: Dr. Dwyane Dee and Priscille Loveless, PMHNP recommend gero-psych once medically cleared. Disposition Initial Assessment Completed for this Encounter: Yes  On Site Evaluation by:   Reviewed with Physician:    Orvis Brill 07/12/2019 3:34 PM

## 2019-07-12 NOTE — ED Notes (Signed)
Called [facility]  To check on staus of TTS, [facility] A/C stated someone was coming to ED to do in person TTS

## 2019-07-12 NOTE — ED Notes (Signed)
Lunch Tray Ordered @ 1051. 

## 2019-07-12 NOTE — ED Notes (Signed)
Assisted patient up to bathroom

## 2019-07-12 NOTE — ED Notes (Signed)
Pt declined nebulizer treatment and states " I need medicine for my nerve pain". RN notified Dr. Dayna Barker.

## 2019-07-12 NOTE — Consult Note (Signed)
[facility] Consult   Reason for Consult:  "suicidal ideation" Referring Physician:  Dr Sloan Leiter Patient Identification: Mario [name] MRN:  [id number] Principal Diagnosis: <principal problem not specified> Diagnosis:  Active Problems:   * No active hospital problems. *   Total Time spent with patient: 30 minutes  Subjective:   Mario [name] is a 69 y.o. male patient who presents to the ER with chronic suicidal ideations 2/t chronic pain and other medical conditions. He reports that his main concern is getting pain relief. He has presented to our facility about 4 times in the past month related to the problems above. He continues to endorse significant pain and would like for [place] to have the medical team to help him. He is receiving outpatient [facility]. He reports previous suicidal attempt and current suicidal ideations that are complicated by his pain, living situation, and isolation. He actively endorses suicidal ideations with a plan to overdose on muscle relaxants.   HPI:  Mario [name] is a 69 y.o. male with multimedical problems documented below who presents the emergency department today secondary to multiple complaints.  #1 patient states he is suicidal.  Patient states he does not his living situation, health problems and has also personal problems.  He is thought about overdosing on muscle relaxers.  He has tried overdose on narcotic pain medication in the past.  States he would like some help with this he is here voluntarily currently.  He also states that he has had shortness of breath.  Similar to previous COPD exacerbation start around 3:00 in afternoon.  Is severe dyspnea with any ambulation and better with rest.  No significant chest pain associate with this as some cough is not productive.  No fevers.  No lower extremity swelling.  Patient also states that he has right foot pain.  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  Patient states that his medications at home do not work.  Is been going on for very long time and has not changed recently but is severe in nature.  No swelling in his leg or no recent trauma.  Past Psychiatric History: Depression  Risk to Self:   Yes Risk to Others:  Denies Prior Inpatient Therapy:  : Behavioral health in 2014 [facility]  Past Medical History:  Past Medical History:  Diagnosis Date  . Anxiety   . Bronchitis   . COPD (chronic obstructive pulmonary disease) ([place])   . Depression   . Hypertension   . Mental disorder   . MI (myocardial infarction) ([place])    ????  . OSA (obstructive sleep apnea)   . Suicide attempt ([employer])   . Tension pneumothorax 06/27/2016    Past Surgical History:  Procedure Laterality Date  . CHEST TUBE INSERTION Left 06/27/2016  . cryptorchidism    . SKIN CANCER EXCISION     Family History:  Family History  Problem Relation Age of Onset  . Dementia Father    Family Psychiatric  History: Denies Social History:  Social History   Substance and Sexual Activity  Alcohol Use No  . Alcohol/week: 0.0 standard drinks   Comment: denies use of any drugs or alcohol     Social History   Substance and Sexual Activity  Drug Use No    Social History   Socioeconomic History  . Marital status: Single    Spouse name: Not on file  .  Number of children: Not on file  . Years of education: Not on file  . Highest education level: Not on file  Occupational History  . Not on file  Tobacco Use  . Smoking status: Current Every Day Smoker    Packs/day: 0.50    Years: 20.00    Pack years: 10.00    Types: Cigarettes  . Smokeless tobacco: Never Used  Substance and Sexual Activity  . Alcohol use: No    Alcohol/week: 0.0 standard drinks    Comment: denies use of any drugs or alcohol  . Drug use: No  . Sexual activity: Not on file  Other Topics Concern  . Not on file  Social History  Narrative  . Not on file   Social Determinants of Health   Financial Resource Strain:   . Difficulty of Paying Living Expenses:   Food Insecurity:   . Worried About Charity fundraiser in the Last Year:   . Arboriculturist in the Last Year:   Transportation Needs:   . Film/video editor (Medical):   [name] Lack of Transportation (Non-Medical):   Physical Activity:   . Days of Exercise per Week:   . Minutes of Exercise per Session:   Stress:   . Feeling of Stress :   Social Connections:   . Frequency of Communication with Friends and Family:   . Frequency of Social Gatherings with Friends and Family:   . Attends Religious Services:   . Active Member of Clubs or Organizations:   . Attends Archivist Meetings:   [name] Marital Status:    Additional Social History:    Allergies:   Allergies  Allergen Reactions  . Demerol [Meperidine] Nausea And Vomiting and Other (See Comments)    Made the patient "violently sick"  . Zocor [Simvastatin] Nausea And Vomiting and Other (See Comments)    Made him very jittery, also  . Beet [Beta Vulgaris] Nausea And Vomiting  . Liver Nausea And Vomiting    Labs:  Results for orders placed or performed during the hospital encounter of 07/11/19 (from the past 48 hour(s))  Rapid urine drug screen (hospital performed)     Status: None   Collection Time: 07/11/19  8:39 PM  Result Value Ref Range   Opiates NONE DETECTED NONE DETECTED   Cocaine NONE DETECTED NONE DETECTED   Benzodiazepines NONE DETECTED NONE DETECTED   Amphetamines NONE DETECTED NONE DETECTED   Tetrahydrocannabinol NONE DETECTED NONE DETECTED   Barbiturates NONE DETECTED NONE DETECTED    Comment: (NOTE) DRUG SCREEN FOR MEDICAL PURPOSES ONLY.  IF CONFIRMATION IS NEEDED FOR ANY PURPOSE, NOTIFY LAB WITHIN 5 DAYS. LOWEST DETECTABLE LIMITS FOR URINE DRUG SCREEN Drug Class                     Cutoff (ng/mL) Amphetamine and metabolites    1000 Barbiturate and metabolites     200 Benzodiazepine                 846 Tricyclics and metabolites     300 Opiates and metabolites        300 Cocaine and metabolites        300 THC                            50 Performed at [facility], [place] [address], [place], [place] [address]   Comprehensive metabolic panel  Status: Abnormal   Collection Time: 07/11/19  8:56 PM  Result Value Ref Range   Sodium 136 135 - 145 mmol/L   Potassium 4.8 3.5 - 5.1 mmol/L   Chloride 103 98 - 111 mmol/L   CO2 23 22 - 32 mmol/L   Glucose, Bld 252 (H) 70 - 99 mg/dL    Comment: Glucose reference range applies only to samples taken after fasting for at least 8 hours.   BUN 16 8 - 23 mg/dL   Creatinine, Ser 1.55 (H) 0.61 - 1.24 mg/dL   Calcium 9.1 8.9 - 10.3 mg/dL   Total Protein 6.7 6.5 - 8.1 g/dL   Albumin 3.9 3.5 - 5.0 g/dL   AST 25 15 - 41 U/L   ALT 30 0 - 44 U/L   Alkaline Phosphatase 58 38 - 126 U/L   Total Bilirubin 0.4 0.3 - 1.2 mg/dL   GFR calc non Af Amer 46 (L) >60 mL/min   GFR calc Af Amer 53 (L) >60 mL/min   Anion gap 10 5 - 15    Comment: Performed at [facility] [address] [place], [place] [address]  Ethanol     Status: None   Collection Time: 07/11/19  8:56 PM  Result Value Ref Range   Alcohol, Ethyl (B) <10 <10 mg/dL    Comment: (NOTE) Lowest detectable limit for serum alcohol is 10 mg/dL. For medical purposes only. Performed at [facility], [place] [address] [place], [place] [address]   Salicylate level     Status: Abnormal   Collection Time: 07/11/19  8:56 PM  Result Value Ref Range   Salicylate Lvl <2.2 (L) 7.0 - 30.0 mg/dL    Comment: Performed at [facility] [address] [place], [place] [address]  Acetaminophen level     Status: Abnormal   Collection Time: 07/11/19  8:56 PM  Result Value Ref Range   Acetaminophen (Tylenol), Serum <10 (L) 10 - 30 ug/mL    Comment: (NOTE) Therapeutic concentrations vary significantly. A range of 10-30 ug/mL  may be an effective  concentration for many patients. However, some  are best treated at concentrations outside of this range. Acetaminophen concentrations >150 ug/mL at 4 hours after ingestion  and >50 ug/mL at 12 hours after ingestion are often associated with  toxic reactions. Performed at [facility], [place] [address], [place], [place] [address]   cbc     Status: Abnormal   Collection Time: 07/11/19  8:56 PM  Result Value Ref Range   WBC 12.0 (H) 4.0 - 10.5 K/uL   RBC 4.74 4.22 - 5.81 MIL/uL   Hemoglobin 15.3 13.0 - 17.0 g/dL   HCT 47.5 39.0 - 52.0 %   MCV 100.2 (H) 80.0 - 100.0 fL   MCH 32.3 26.0 - 34.0 pg   MCHC 32.2 30.0 - 36.0 g/dL   RDW 13.1 11.5 - 15.5 %   Platelets 314 150 - 400 K/uL   nRBC 0.0 0.0 - 0.2 %    Comment: Performed at [facility], [place] [address] [place], [place] [address]  Troponin I (High Sensitivity)     Status: None   Collection Time: 07/12/19  2:06 AM  Result Value Ref Range   Troponin I (High Sensitivity) 6 <18 ng/L    Comment: (NOTE) Elevated high sensitivity troponin I (hsTnI) values and significant  changes across serial measurements may suggest ACS but many other  chronic and acute conditions  are known to elevate hsTnI results.  Refer to the "Links" section for chest pain algorithms and additional  guidance. Performed at [facility], [place] [address], [place], [place] [address]   SARS Coronavirus 2 by RT PCR (hospital order, performed in [facility] hospital lab) Nasopharyngeal Nasopharyngeal Swab     Status: None   Collection Time: 07/12/19  2:19 AM   Specimen: Nasopharyngeal Swab  Result Value Ref Range   SARS Coronavirus 2 NEGATIVE NEGATIVE    Comment: (NOTE) SARS-CoV-2 target nucleic acids are NOT DETECTED. The SARS-CoV-2 RNA is generally detectable in upper and lower respiratory specimens during the acute phase of infection. The lowest concentration of SARS-CoV-2 viral copies this assay can detect is 250 copies / mL. A negative result  does not preclude SARS-CoV-2 infection and should not be used as the sole basis for treatment or other patient management decisions.  A negative result may occur with improper specimen collection / handling, submission of specimen other than nasopharyngeal swab, presence of viral mutation(s) within the areas targeted by this assay, and inadequate number of viral copies (<250 copies / mL). A negative result must be combined with clinical observations, patient history, and epidemiological information. Fact Sheet for Patients:   StrictlyIdeas.no Fact Sheet for Healthcare Providers: BankingDealers.co.za This test is not yet approved or cleared  by the [place] FDA and has been authorized for detection and/or diagnosis of SARS-CoV-2 by FDA under an Emergency Use Authorization (EUA).  This EUA will remain in effect (meaning this test can be used) for the duration of the COVID-19 declaration under Section 564(b)(1) of the Act, 21 U.S.C. section 360bbb-3(b)(1), unless the authorization is terminated or revoked sooner. Performed at [facility], [place] [address] [place], [place] [address]   Troponin I (High Sensitivity)     Status: None   Collection Time: 07/12/19  3:43 AM  Result Value Ref Range   Troponin I (High Sensitivity) 6 <18 ng/L    Comment: (NOTE) Elevated high sensitivity troponin I (hsTnI) values and significant  changes across serial measurements may suggest ACS but many other  chronic and acute conditions are known to elevate hsTnI results.  Refer to the "Links" section for chest pain algorithms and additional  guidance. Performed at [facility], [place] [address] [place], [place] [address]   CBG monitoring, ED     Status: Abnormal   Collection Time: 07/12/19 10:50 AM  Result Value Ref Range   Glucose-Capillary 335 (H) 70 - 99 mg/dL    Comment: Glucose reference range applies only to samples taken after fasting  for at least 8 hours.    Current Facility-Administered Medications  Medication Dose Route Frequency Provider Last Rate Last Admin  . albuterol (VENTOLIN HFA) 108 (90 Base) MCG/ACT inhaler 2 puff  2 puff Inhalation Q6H PRN Mesner, Corene Cornea, MD      . ascorbic acid (VITAMIN C) tablet 500 mg  500 mg Oral BID Mesner, Corene Cornea, MD   500 mg at 07/12/19 1212  . aspirin EC tablet 81 mg  81 mg Oral Daily Mesner, Jason, MD   81 mg at 07/12/19 1213  . buPROPion (WELLBUTRIN XL) 24 hr tablet 300 mg  300 mg Oral QPC breakfast Mesner, Corene Cornea, MD   300 mg at 07/12/19 1211  . busPIRone (BUSPAR) tablet 15 mg  15 mg Oral Daily Mesner, Jason, MD   15 mg at 07/12/19 1211  . cholecalciferol (VITAMIN D3) tablet 1,000 Units  1,000 Units Oral  Q breakfast Mesner, Corene Cornea, MD      . cyclobenzaprine (FLEXERIL) tablet 10 mg  10 mg Oral QHS Mesner, Corene Cornea, MD      . diltiazem (CARDIZEM CD) 24 hr capsule 180 mg  180 mg Oral Daily Mesner, Jason, MD   180 mg at 07/12/19 1215  . docusate sodium (COLACE) capsule 200 mg  200 mg Oral Daily Mesner, Jason, MD   200 mg at 07/12/19 1210  . DULoxetine (CYMBALTA) DR capsule 60 mg  60 mg Oral BID Mesner, Corene Cornea, MD   60 mg at 07/12/19 1214  . gabapentin (NEURONTIN) capsule 900 mg  900 mg Oral QHS Mesner, Corene Cornea, MD      . ibuprofen (ADVIL) tablet 400 mg  400 mg Oral Q6H PRN Mesner, Corene Cornea, MD      . ipratropium-albuterol (DUONEB) 0.5-2.5 (3) MG/3ML nebulizer solution 3 mL  3 mL Nebulization Q4H Mesner, Corene Cornea, MD      . lisinopril (ZESTRIL) tablet 20 mg  20 mg Oral Daily Mesner, Jason, MD   20 mg at 07/12/19 1213  . lurasidone (LATUDA) tablet 40 mg  40 mg Oral QPC supper Mesner, Corene Cornea, MD      . melatonin tablet 6 mg  6 mg Oral QHS Mesner, Corene Cornea, MD      . methocarbamol (ROBAXIN) tablet 500 mg  500 mg Oral TID PRN Mesner, Corene Cornea, MD      . mycophenolate (CELLCEPT) capsule 1,500 mg  1,500 mg Oral BID Mesner, Corene Cornea, MD   1,500 mg at 07/12/19 1215  . Oxcarbazepine (TRILEPTAL) tablet 300 mg  300 mg Oral  TID Mesner, Corene Cornea, MD   300 mg at 07/12/19 1214  . polyvinyl alcohol (LIQUIFILM TEARS) 1.4 % ophthalmic solution 1 drop  1 drop Both Eyes QID Mesner, Corene Cornea, MD      . sodium chloride (OCEAN) 0.65 % nasal spray 2 spray  2 spray Each Nare QID PRN Mesner, Corene Cornea, MD      . traZODone (DESYREL) tablet 50-75 mg  50-75 mg Oral QHS Mesner, Corene Cornea, MD      . vitamin E capsule 400 Units  400 Units Oral Daily Mesner, Corene Cornea, MD       Current Outpatient Medications  Medication Sig Dispense Refill  . albuterol (PROAIR HFA) 108 (90 Base) MCG/ACT inhaler Inhale 2 puffs into the lungs every 6 (six) hours as needed for wheezing or shortness of breath.    [name] aspirin EC 81 MG tablet Take 81 mg by mouth daily.    [name] buPROPion (WELLBUTRIN XL) 300 MG 24 hr tablet Take 300 mg by mouth daily after breakfast.     . busPIRone (BUSPAR) 15 MG tablet Take 15 mg by mouth daily.     . calcium-vitamin D (OSCAL WITH D) 500-200 MG-UNIT tablet Take 1 tablet by mouth 2 (two) times daily with a meal.    . Cholecalciferol (VITAMIN D-3) 25 MCG (1000 UT) CAPS Take 1,000 Units by mouth daily with breakfast.    . clobetasol ointment (TEMOVATE) 3.50 % Apply 1 application topically See admin instructions. Apply a small amount to healed areas of legs 2 times a day as directed- avoid face and genitals    . cyclobenzaprine (FLEXERIL) 10 MG tablet Take 10 mg by mouth at bedtime.    [name] Dextran 70-Hypromellose (ARTIFICIAL TEARS PF OP) Place 1-2 drops into both eyes every 2 (two) hours as needed (for dryness).     [name] diltiazem (CARDIZEM CD) 180 MG 24 hr capsule Take 180 mg by  mouth daily.    [name] docusate calcium (SURFAK) 240 MG capsule Take 240 mg by mouth daily.    . DULoxetine (CYMBALTA) 60 MG capsule Take 1 capsule (60 mg total) by mouth 2 (two) times daily. (Patient taking differently: Take 60 mg by mouth 2 (two) times daily. ) 60 capsule 0  . etanercept (ENBREL) 50 MG/ML injection Inject 50 mg into the skin every Wednesday.     . folic acid  (FOLVITE) 1 MG tablet Take 1 mg by mouth daily.    [name] gabapentin (NEURONTIN) 300 MG capsule Take 2 capsules (600 mg total) by mouth 2 (two) times daily. (Patient taking differently: Take 900 mg by mouth at bedtime. ) 60 capsule 1  . lisinopril (PRINIVIL,ZESTRIL) 20 MG tablet Take 1 tablet (20 mg total) by mouth daily. For high blood pressure (Patient taking differently: Take 20 mg by mouth daily. ) 30 tablet 0  . lurasidone (LATUDA) 40 MG TABS tablet Take 40 mg by mouth daily after supper.    . Melatonin 3 MG TABS Take 6 mg by mouth at bedtime.    . methocarbamol (ROBAXIN) 500 MG tablet Take 500 mg by mouth 3 (three) times daily as needed (for muscular pain).    . methotrexate (RHEUMATREX) 2.5 MG tablet Take 6 tablets (15 mg total) by mouth every Saturday. Hold until seen by rheumatology  Caution:Chemotherapy. Protect from light. (Patient taking differently: Take 15 mg by mouth every Saturday. Caution:Chemotherapy. Protect from light.) 4 tablet 0  . Multiple Vitamin (MULTIVITAMIN WITH MINERALS) TABS tablet Take 1 tablet by mouth daily.    . mycophenolate (CELLCEPT) 250 MG capsule Take 1,500 mg by mouth 2 (two) times daily.    . nitroGLYCERIN (NITROSTAT) 0.4 MG SL tablet Place 1 tablet (0.4 mg total) under the tongue every 5 (five) minutes as needed for chest pain. 12 tablet 12  . Oxcarbazepine (TRILEPTAL) 300 MG tablet Take 300 mg by mouth 3 (three) times daily.     [name] Propylene Glycol (SYSTANE BALANCE) 0.6 % SOLN Place 1 drop into both eyes 4 (four) times daily.    . sodium chloride (OCEAN) 0.65 % SOLN nasal spray Place 2 sprays into both nostrils 4 (four) times daily as needed for congestion.    . Tiotropium Bromide-Olodaterol (STIOLTO RESPIMAT) 2.5-2.5 MCG/ACT AERS Inhale 2 puffs into the lungs daily.     . traZODone (DESYREL) 50 MG tablet Take 50-75 mg by mouth at bedtime.     . vitamin C (ASCORBIC ACID) 500 MG tablet Take 500 mg by mouth 2 (two) times daily.     . vitamin E (VITAMIN E) 180 MG  (400 UNITS) capsule Take 400 Units by mouth daily.    . OXYGEN Inhale 2 L/min into the lungs See admin instructions. 2 liters/min at bedtime with CPAP, during naps and all times of exertion    . PRESCRIPTION MEDICATION CPAP- At bedtime and during during any naps      Musculoskeletal: Strength & Muscle Tone: within normal limits Gait & Station: normal Patient leans: N/A  Psychiatric Specialty Exam: Physical Exam   Review of Systems   Blood pressure 135/80, pulse 97, temperature (!) 97.5 F (36.4 C), temperature source Oral, resp. rate 19, SpO2 93 %.There is no height or weight on file to calculate BMI.  General Appearance: Casual and Fairly Groomed  Eye Contact:  Good  Speech:  Clear and Coherent and Normal Rate  Volume:  Normal  Mood:  Depressed  Affect:  Depressed and Flat  Thought Process:  Coherent, Goal Directed and Descriptions of Associations: Circumstantial  Orientation:  Full (Time, Place, and Person)  Thought Content:  Logical  Suicidal Thoughts:  Yes.  with intent/plan  Homicidal Thoughts:  No  Memory:  Immediate;   Good Recent;   Good Remote;   Good  Judgement:  Good  Insight:  Good  Psychomotor Activity:  Normal  Concentration:  Concentration: Good and Attention Span: Good  Recall:  Good  Fund of Knowledge:  Good  Language:  Good  Akathisia:  No  Handed:  Right  AIMS (if indicated):     Assets:  Communication Skills Desire for Improvement Financial Resources/Insurance Housing Intimacy Leisure Time Physical Health Resilience Social Support Talents/Skills  ADL's:  Intact  Cognition:  WNL  Sleep:        Treatment Plan Summary: Patient discussed with Dwyane Dee. Will recommend gero psych at this time for ongoing management of depression, anxiety, and chronic suicidal ideations. Patient has multiple risk factors for suicide to include age, caucasian male, no family support, veteran, chronic pain, and isolation.  Daily contact with patient to assess and  evaluate symptoms and progress in treatment and Medication management   Continue home medications as ordered.  Disposition: Recommend psychiatric Inpatient admission when medically cleared.  Suella Broad, FNP 07/12/2019 2:24 PM

## 2019-07-12 NOTE — Progress Notes (Signed)
Inpatient Diabetes Program Recommendations  AACE/ADA: New Consensus Statement on Inpatient Glycemic Control (2015)  Target Ranges:  Prepandial:   less than 140 mg/dL      Peak postprandial:   less than 180 mg/dL (1-2 hours)      Critically ill patients:  140 - 180 mg/dL   Lab Results  Component Value Date   GLUCAP 335 (H) 07/12/2019   HGBA1C 7.5 (H) 07/02/2019    Review of Glycemic Control Results for DIYARI, CHERNE (MRN [id number]) as of 07/12/2019 12:59  Ref. Range 07/12/2019 10:50  Glucose-Capillary Latest Ref Range: 70 - 99 mg/dL 335 (H)    Diabetes history: DM2  Outpatient Diabetes medications:  None  Inpatient Diabetes Program Recommendations:     CHO modified diet CBG's AC/HS  Novolog 0-15 TID & 0-5 QHS   Thank you, Reche Dixon, RN, BSN Diabetes Coordinator Inpatient Diabetes Program [phone] (team pager from 8a-5p)

## 2019-07-13 ENCOUNTER — Other Ambulatory Visit: Payer: Self-pay

## 2019-07-13 LAB — CBG MONITORING, ED: Glucose-Capillary: 194 mg/dL — ABNORMAL HIGH (ref 70–99)

## 2019-07-13 MED ORDER — METFORMIN HCL 500 MG PO TABS
500.0000 mg | ORAL_TABLET | Freq: Once | ORAL | Status: AC
  Administered 2019-07-13: 500 mg via ORAL
  Filled 2019-07-13: qty 1

## 2019-07-13 MED ORDER — TRAZODONE HCL 50 MG PO TABS
50.0000 mg | ORAL_TABLET | Freq: Every day | ORAL | Status: DC
  Administered 2019-07-13 – 2019-07-14 (×2): 50 mg via ORAL
  Filled 2019-07-13 (×2): qty 1

## 2019-07-13 MED ORDER — UMECLIDINIUM-VILANTEROL 62.5-25 MCG/INH IN AEPB
1.0000 | INHALATION_SPRAY | Freq: Every day | RESPIRATORY_TRACT | Status: DC
  Administered 2019-07-14 – 2019-07-15 (×2): 1 via RESPIRATORY_TRACT
  Filled 2019-07-13 (×2): qty 14

## 2019-07-13 NOTE — Progress Notes (Signed)
Patient meets criteria for inpatient treatment. No appropriate or available beds at [facility]. CSW faxed referrals to the following facilities for review:   [facility] (after hours)   [facility]    TTS will continue to seek bed placement.  Chalmers Guest. Guerry Bruin, MSW, [place] Work/Disposition Phone: [phone] Fax: [phone]

## 2019-07-13 NOTE — ED Provider Notes (Signed)
Emergency Medicine Observation Re-evaluation Note  Mario [name] is a 69 y.o. male, seen on rounds today.  Pt initially presented to the ED for complaints of Shortness of Breath and Medical Clearance Currently, the patient is sleeping  Physical Exam  BP (!) 100/55   Pulse 90   Temp 97.6 F (36.4 C) (Oral)   Resp 18   SpO2 93%  Physical Exam Vitals and nursing note reviewed.  Constitutional:      General: He is not in acute distress.    Appearance: He is well-developed. He is not ill-appearing, toxic-appearing or diaphoretic.  HENT:     Head: Atraumatic.  Eyes:     Pupils: Pupils are equal, round, and reactive to light.  Cardiovascular:     Rate and Rhythm: Normal rate and regular rhythm.  Pulmonary:     Effort: Pulmonary effort is normal. No respiratory distress.  Abdominal:     General: There is no distension.     Palpations: Abdomen is soft.  Musculoskeletal:        General: Normal range of motion.     Cervical back: Normal range of motion and neck supple.  Skin:    General: Skin is warm and dry.  Neurological:     Mental Status: He is alert.    ED Course / MDM  EKG:EKG Interpretation  Date/Time:  Thursday Jul 11 2019 20:43:05 EDT Ventricular Rate:  103 PR Interval:  184 QRS Duration: 112 QT Interval:  348 QTC Calculation: 455 R Axis:   54 Text Interpretation: Sinus tachycardia Otherwise normal ECG No significant change since last tracing Confirmed by Merrily Pew [phone]) on 07/12/2019 1:56:54 AM    I have reviewed the labs performed to date as well as medications administered while in observation.  Recent changes in the last 24 hours include seen by DM educator who recommends NovoLog sliding scale and diet modification. >>> Patient refused Insulin however is agreeable to Metformin PO. Will place order for Metformin. Will place orders and change diet to carb controlled.  Did have hypoglycemia yesterday and was given 1 dose of metformin which he was on at home.   CBG today at 194. He was placed on 2 L oxygen while sleeping due to history of sleep apnea and CPAP at home. No oxygen need when awake.  Does have history of COPD however does not appear to have gross exacerbation at this time.  He does have as needed inhalers ordered. Plan  Current plan is for awaiting bed placement for [facility] psych. Medically stable at this time. Patient is under full IVC at this time.  The patient has been placed in psychiatric observation due to the need to provide a safe environment for the patient while obtaining psychiatric consultation and evaluation, as well as ongoing medical and medication management to treat the patient's condition.  The patient has been placed under full IVC at this time.   Sederick Jacobsen A, PA-C 07/13/19 1003    Little, Wenda Over[place], MD 07/13/19 1321

## 2019-07-13 NOTE — ED Notes (Signed)
Pt given Robaxin as requested d/t c/o chronic neck pain. Pt asking for Tylenol as well. Advised pt he may have Ibuprofen at 1630. Voiced understanding.

## 2019-07-13 NOTE — ED Notes (Signed)
B Mancheril, Pharmacist, to review pt's home meds as pt states he is on a long-acting inhaler and only uses Proventil prn.

## 2019-07-13 NOTE — ED Notes (Signed)
Pt noted to be lying on bed w/eyes closed. Respirations even, unlabored. O2 infusing at 2L/min.

## 2019-07-13 NOTE — ED Notes (Signed)
Breakfast Ordered 

## 2019-07-13 NOTE — ED Notes (Signed)
Pt states he is on CPAP at night and does not have anyone who can bring his CPAP machine. States his roommate does not drive.

## 2019-07-14 NOTE — ED Provider Notes (Signed)
Emergency Medicine Observation Re-evaluation Note  Mario [name] is a 69 y.o. male, seen on rounds today.  Pt initially presented to the ED for complaints of Shortness of Breath and Medical Clearance Currently, the patient is currently under IVC.  Pending bed placement for Geri psych  Physical Exam  BP 137/66 (BP Location: Left Arm)   Pulse 87   Temp 97.8 F (36.6 C) (Oral)   Resp 18   SpO2 93%  Physical Exam Vitals and nursing note reviewed.  Constitutional:      General: He is not in acute distress.    Appearance: He is well-developed. He is not diaphoretic.  HENT:     Head: Atraumatic.  Cardiovascular:     Rate and Rhythm: Normal rate and regular rhythm.  Pulmonary:     Effort: Pulmonary effort is normal. No respiratory distress.  Abdominal:     General: There is no distension.     Palpations: Abdomen is soft.  Musculoskeletal:        General: Normal range of motion.     Cervical back: Normal range of motion and neck supple.  Skin:    General: Skin is warm and dry.  Neurological:     Mental Status: He is alert.     ED Course / MDM  EKG:EKG Interpretation  Date/Time:  Thursday Jul 11 2019 20:43:05 EDT Ventricular Rate:  103 PR Interval:  184 QRS Duration: 112 QT Interval:  348 QTC Calculation: 455 R Axis:   54 Text Interpretation: Sinus tachycardia Otherwise normal ECG No significant change since last tracing Confirmed by Merrily Pew [phone]) on 07/12/2019 1:56:54 AM    I have reviewed the labs performed to date as well as medications administered while in observation.  Recent changes in the last 24 hours include stable blood sugars on Metformin.  No respiratory distress.   Plan  Current plan is for patient to be [facility]  No current beds available. Patient is under full IVC at this time.   Mario [name] A, PA-C 07/14/19 [id number]    Mario Morgan, MD 07/15/19 1512

## 2019-07-14 NOTE — ED Notes (Signed)
Pt aware of tx plan - Continues to meet Inpt - Pt voiced concern d/t he states he wants to be d/c'd to home - denies SI/HI. Offered for pt to call someone to advise of tx plan. States he does not have roommate's phone number. Offered for pt to call contact listed in his demographics - Initially declined then stated he would like to try to call him to obtain other friends' numbers. States if he has to go Inpt, he only wants to go to [place].

## 2019-07-14 NOTE — BH Assessment (Addendum)
08/14/19: Patient continues to meet inpatient criteria per Ricky Ala, NP. Re-faxed referrals to the [facility]. Also, faxed referrals to additional hospitals. See list below:  Re-faxed to [facility]: [facility] (after hours)  [facility]   Faxed to additional hospitals:  [place] Details  [facility] Details  [facility] Details  [facility] Details  [facility] Details  [facility] Details  [facility] Details  [facility] Details  [facility] Details  [facility] Details  [facility] Details  [facility] Details  [facility] Details  [facility] Details  [facility] Details  [place] Details  [facility] Details  [place] Details  [facility] Details  [facility] Details  [facility] Details  [facility] Details  [facility]

## 2019-07-14 NOTE — ED Notes (Signed)
Cat, [place], advised declining pt d/t requires CPAP or O2 at night.

## 2019-07-14 NOTE — ED Notes (Signed)
Pt given ice pack for c/o chronic headache/neck pain.

## 2019-07-14 NOTE — ED Notes (Signed)
Pt had bathed himself and changed gown prior to VS's being taken.

## 2019-07-14 NOTE — ED Notes (Signed)
Breakfast Ordered 

## 2019-07-15 DIAGNOSIS — F329 Major depressive disorder, single episode, unspecified: Secondary | ICD-10-CM | POA: Diagnosis not present

## 2019-07-15 MED ORDER — ACETAMINOPHEN 325 MG PO TABS
650.0000 mg | ORAL_TABLET | Freq: Four times a day (QID) | ORAL | Status: DC | PRN
  Administered 2019-07-15: 650 mg via ORAL
  Filled 2019-07-15: qty 2

## 2019-07-15 NOTE — Progress Notes (Signed)
CSW left message with April at [facility] to discuss bed availability.   Audree Camel, LCSW, [place] Disposition [facility]/TTS [phone]

## 2019-07-15 NOTE — Consult Note (Signed)
Telepsych Consultation   Reason for Consult:  Suicidal ideation Referring Physician: EDP Location of Patient: [facility] Location of Provider: [place] Department  Patient Identification: Mario [name] MRN:  [id number] Principal Diagnosis: Depression Diagnosis:  Principal Problem:   Depression   Total Time spent with patient: 20 minutes  Subjective:   Mario [name] is a 69 y.o. male patient admitted with report of "Nerve pain that was getting worse but now it's better and I'm no longer suicidal."  HPI:    From initial Mario [name] on 07/12/2019 by Orvis Brill, Counselor:  Mario [name] is an 69 y.o. male presenting voluntarily to [facility] ED with multiple complaints including SOB, nerve pain, and repeated suicidal ideation secondary to chronic pain. Patient reports SI with a plan to overdose on muscle relaxer's, which he states he has at home. Patient identifies several life stressors including not liking his residence, no natural supports, and chronic pain. Patient is seen at the [facility] for medication management. He does not currently have an outpatient therapist. Patient denies HI/AVH, substance use, or trauma history.   Patient is alert and oriented x 4. He is dressed in a hospital gown. His speech is logical, eye contact is good, and thoughts are organized. Patient's mood is depressed/irritable and his affect is congruent. He has limited insight, judgement, and impulse control. He does not appear to be responding to internal stimuli or experiencing delusional thought content.  From psychiatric assessment 07/15/2019:  Patient is alert and oriented. He denies current suicidal ideation. He states "I was not suicidal yesterday either. The pain in my right foot is better now. I really need to get back home. I did not plan on staying this long. I have appointments at the [facility] later this week that I can't miss. I am not going to harm myself now. I am not having any thoughts like that. I  need to go home." Patient cooperative with assessment. He says has an appointment with PCP and is hopeful that a treatment will be put in place for his chronic pain. When asked why his pain is now better stated "I think the nerves must be healing. It's just not as bad." Discussed case with Dr. Dwyane Dee. The patient no longer meets criteria for inpatient. He has has services through the [place] for this week to address his medical concerns. Reports that he takes medications for depression and is compliant with the medications.   Past Psychiatric History: Depression  Risk to Self: Suicidal Ideation: Denies on 07/15/2019 Suicidal Intent: Denies Is patient at risk for suicide?: Yes due to medical problems Suicidal Plan?: Denies  Specify Current Suicidal Plan: Denies currently Access to Means: Yes Specify Access to Suicidal Means: states has a bottle at home What has been your use of drugs/alcohol within the last 12 months?: denies How many times?: 0 Other Self Harm Risks: denies Triggers for Past Attempts: None known Intentional Self Injurious Behavior: None Risk to Others: Homicidal Ideation: No Thoughts of Harm to Others: No Current Homicidal Intent: No Current Homicidal Plan: No Access to Homicidal Means: No Identified Victim: denies History of harm to others?: No Assessment of Violence: None Noted Violent Behavior Description: denies Does patient have access to weapons?: No Criminal Charges Pending?: No Does patient have a court date: No Prior Inpatient Therapy: Prior Inpatient Therapy: No Prior Outpatient Therapy: Prior Outpatient Therapy: Yes Prior Therapy Dates: ongoing [facility] Reason for Treatment: med management Does patient have an ACCT team?: No Does patient have  Intensive In-House Services?  : No Does patient have [facility] services? : No Does patient have P4CC services?: No  Past Medical History:  Past Medical History:  Diagnosis Date  . Anxiety    . Bronchitis   . COPD (chronic obstructive pulmonary disease) ([place])   . Depression   . Hypertension   . Mental disorder   . MI (myocardial infarction) ([place])    ????  . OSA (obstructive sleep apnea)   . Suicide attempt ([place])   . Tension pneumothorax 06/27/2016    Past Surgical History:  Procedure Laterality Date  . CHEST TUBE INSERTION Left 06/27/2016  . cryptorchidism    . SKIN CANCER EXCISION     Family History:  Family History  Problem Relation Age of Onset  . Dementia Father    Family Psychiatric  History: None Social History:  Social History   Substance and Sexual Activity  Alcohol Use No  . Alcohol/week: 0.0 standard drinks   Comment: denies use of any drugs or alcohol     Social History   Substance and Sexual Activity  Drug Use No    Social History   Socioeconomic History  . Marital status: Single    Spouse name: Not on file  . Number of children: Not on file  . Years of education: Not on file  . Highest education level: Not on file  Occupational History  . Not on file  Tobacco Use  . Smoking status: Current Every Day Smoker    Packs/day: 0.50    Years: 20.00    Pack years: 10.00    Types: Cigarettes  . Smokeless tobacco: Never Used  Substance and Sexual Activity  . Alcohol use: No    Alcohol/week: 0.0 standard drinks    Comment: denies use of any drugs or alcohol  . Drug use: No  . Sexual activity: Not on file  Other Topics Concern  . Not on file  Social History Narrative  . Not on file   Social Determinants of Health   Financial Resource Strain:   . Difficulty of Paying Living Expenses:   Food Insecurity:   . Worried About Charity fundraiser in the Last Year:   . Arboriculturist in the Last Year:   Transportation Needs:   . Film/video editor (Medical):   [name] Lack of Transportation (Non-Medical):   Physical Activity:   . Days of Exercise per Week:   . Minutes of Exercise per Session:   Stress:   . Feeling of Stress :   Social  Connections:   . Frequency of Communication with Friends and Family:   . Frequency of Social Gatherings with Friends and Family:   . Attends Religious Services:   . Active Member of Clubs or Organizations:   . Attends Archivist Meetings:   [name] Marital Status:    Additional Social History:    Allergies:   Allergies  Allergen Reactions  . Demerol [Meperidine] Nausea And Vomiting and Other (See Comments)    Made the patient "violently sick"  . Zocor [Simvastatin] Nausea And Vomiting and Other (See Comments)    Made him very jittery, also  . Beet [Beta Vulgaris] Nausea And Vomiting  . Liver Nausea And Vomiting    Labs: No results found for this or any previous visit (from the past 48 hour(s)).  Medications:  Current Facility-Administered Medications  Medication Dose Route Frequency Provider Last Rate Last Admin  . acetaminophen (TYLENOL) tablet 650 mg  650  mg Oral Q6H PRN Hayden Rasmussen, MD   650 mg at 07/15/19 1054  . albuterol (VENTOLIN HFA) 108 (90 Base) MCG/ACT inhaler 2 puff  2 puff Inhalation Q6H PRN Mesner, Corene Cornea, MD      . albuterol (VENTOLIN HFA) 108 (90 Base) MCG/ACT inhaler 2 puff  2 puff Inhalation Q6H Mesner, Jason, MD   2 puff at 07/12/19 2211  . ascorbic acid (VITAMIN C) tablet 500 mg  500 mg Oral BID Mesner, Corene Cornea, MD   500 mg at 07/15/19 0952  . aspirin EC tablet 81 mg  81 mg Oral Daily Mesner, Jason, MD   81 mg at 07/15/19 0946  . buPROPion (WELLBUTRIN XL) 24 hr tablet 300 mg  300 mg Oral QPC breakfast Mesner, Corene Cornea, MD   300 mg at 07/15/19 0947  . busPIRone (BUSPAR) tablet 15 mg  15 mg Oral Daily Mesner, Jason, MD   15 mg at 07/15/19 0946  . cholecalciferol (VITAMIN D3) tablet 1,000 Units  1,000 Units Oral Q breakfast Mesner, Corene Cornea, MD   1,000 Units at 07/15/19 [phone]  . cyclobenzaprine (FLEXERIL) tablet 10 mg  10 mg Oral QHS Mesner, Jason, MD   10 mg at 07/14/19 2151  . diltiazem (CARDIZEM CD) 24 hr capsule 180 mg  180 mg Oral Daily Mesner, Jason, MD    180 mg at 07/14/19 1017  . docusate sodium (COLACE) capsule 200 mg  200 mg Oral Daily Mesner, Jason, MD   200 mg at 07/15/19 0952  . DULoxetine (CYMBALTA) DR capsule 60 mg  60 mg Oral BID Mesner, Corene Cornea, MD   60 mg at 07/15/19 0952  . gabapentin (NEURONTIN) capsule 900 mg  900 mg Oral QHS Mesner, Jason, MD   900 mg at 07/14/19 2150  . ibuprofen (ADVIL) tablet 400 mg  400 mg Oral Q6H PRN Mesner, Corene Cornea, MD   400 mg at 07/14/19 1441  . lisinopril (ZESTRIL) tablet 20 mg  20 mg Oral Daily Mesner, Jason, MD   20 mg at 07/15/19 0948  . lurasidone (LATUDA) tablet 40 mg  40 mg Oral QPC supper Mesner, Corene Cornea, MD   40 mg at 07/14/19 1755  . melatonin tablet 6 mg  6 mg Oral QHS Mesner, Corene Cornea, MD   6 mg at 07/14/19 2151  . methocarbamol (ROBAXIN) tablet 500 mg  500 mg Oral TID PRN Mesner, Corene Cornea, MD   500 mg at 07/14/19 1441  . mycophenolate (CELLCEPT) capsule 1,500 mg  1,500 mg Oral BID Mesner, Corene Cornea, MD   1,500 mg at 07/14/19 2150  . Oxcarbazepine (TRILEPTAL) tablet 300 mg  300 mg Oral TID Mesner, Corene Cornea, MD   300 mg at 07/15/19 0952  . polyvinyl alcohol (LIQUIFILM TEARS) 1.4 % ophthalmic solution 1 drop  1 drop Both Eyes QID Mesner, Jason, MD   1 drop at 07/15/19 1003  . sodium chloride (OCEAN) 0.65 % nasal spray 2 spray  2 spray Each Nare QID PRN Mesner, Corene Cornea, MD      . traZODone (DESYREL) tablet 50 mg  50 mg Oral QHS Henderly, Britni A, PA-C   50 mg at 07/14/19 2150  . umeclidinium-vilanterol (ANORO ELLIPTA) 62.5-25 MCG/INH 1 puff  1 puff Inhalation Daily Wyvonnia Dusky, MD   1 puff at 07/15/19 0945  . vitamin E capsule 400 Units  400 Units Oral Daily Mesner, Jason, MD   400 Units at 07/14/19 1017   Current Outpatient Medications  Medication Sig Dispense Refill  . albuterol (PROAIR HFA) 108 (90 Base) MCG/ACT inhaler  Inhale 2 puffs into the lungs every 6 (six) hours as needed for wheezing or shortness of breath.    [name] aspirin EC 81 MG tablet Take 81 mg by mouth daily.    [name] buPROPion (WELLBUTRIN XL) 300 MG  24 hr tablet Take 300 mg by mouth daily after breakfast.     . busPIRone (BUSPAR) 15 MG tablet Take 15 mg by mouth daily.     . calcium-vitamin D (OSCAL WITH D) 500-200 MG-UNIT tablet Take 1 tablet by mouth 2 (two) times daily with a meal.    . Cholecalciferol (VITAMIN D-3) 25 MCG (1000 UT) CAPS Take 1,000 Units by mouth daily with breakfast.    . clobetasol ointment (TEMOVATE) 6.04 % Apply 1 application topically See admin instructions. Apply a small amount to healed areas of legs 2 times a day as directed- avoid face and genitals    . cyclobenzaprine (FLEXERIL) 10 MG tablet Take 10 mg by mouth at bedtime.    [name] Dextran 70-Hypromellose (ARTIFICIAL TEARS PF OP) Place 1-2 drops into both eyes every 2 (two) hours as needed (for dryness).     [name] diltiazem (CARDIZEM CD) 180 MG 24 hr capsule Take 180 mg by mouth daily.    [name] docusate calcium (SURFAK) 240 MG capsule Take 240 mg by mouth daily.    . DULoxetine (CYMBALTA) 60 MG capsule Take 1 capsule (60 mg total) by mouth 2 (two) times daily. (Patient taking differently: Take 60 mg by mouth 2 (two) times daily. ) 60 capsule 0  . etanercept (ENBREL) 50 MG/ML injection Inject 50 mg into the skin every Wednesday.     . folic acid (FOLVITE) 1 MG tablet Take 1 mg by mouth daily.    [name] gabapentin (NEURONTIN) 300 MG capsule Take 2 capsules (600 mg total) by mouth 2 (two) times daily. (Patient taking differently: Take 900 mg by mouth at bedtime. ) 60 capsule 1  . lisinopril (PRINIVIL,ZESTRIL) 20 MG tablet Take 1 tablet (20 mg total) by mouth daily. For high blood pressure (Patient taking differently: Take 20 mg by mouth daily. ) 30 tablet 0  . lurasidone (LATUDA) 40 MG TABS tablet Take 40 mg by mouth daily after supper.    . Melatonin 3 MG TABS Take 6 mg by mouth at bedtime.    . methocarbamol (ROBAXIN) 500 MG tablet Take 500 mg by mouth 3 (three) times daily as needed (for muscular pain).    . methotrexate (RHEUMATREX) 2.5 MG tablet Take 6 tablets (15 mg total) by  mouth every Saturday. Hold until seen by rheumatology  Caution:Chemotherapy. Protect from light. (Patient taking differently: Take 15 mg by mouth every Saturday. Caution:Chemotherapy. Protect from light.) 4 tablet 0  . Multiple Vitamin (MULTIVITAMIN WITH MINERALS) TABS tablet Take 1 tablet by mouth daily.    . mycophenolate (CELLCEPT) 250 MG capsule Take 1,500 mg by mouth 2 (two) times daily.    . nitroGLYCERIN (NITROSTAT) 0.4 MG SL tablet Place 1 tablet (0.4 mg total) under the tongue every 5 (five) minutes as needed for chest pain. 12 tablet 12  . Oxcarbazepine (TRILEPTAL) 300 MG tablet Take 300 mg by mouth 3 (three) times daily.     [name] Propylene Glycol (SYSTANE BALANCE) 0.6 % SOLN Place 1 drop into both eyes 4 (four) times daily.    . sodium chloride (OCEAN) 0.65 % SOLN nasal spray Place 2 sprays into both nostrils 4 (four) times daily as needed for congestion.    . Tiotropium Bromide-Olodaterol (STIOLTO RESPIMAT) 2.5-2.5  MCG/ACT AERS Inhale 2 puffs into the lungs daily.     . traZODone (DESYREL) 50 MG tablet Take 50-75 mg by mouth at bedtime.     . vitamin C (ASCORBIC ACID) 500 MG tablet Take 500 mg by mouth 2 (two) times daily.     . vitamin E (VITAMIN E) 180 MG (400 UNITS) capsule Take 400 Units by mouth daily.    . OXYGEN Inhale 2 L/min into the lungs See admin instructions. 2 liters/min at bedtime with CPAP, during naps and all times of exertion    . PRESCRIPTION MEDICATION CPAP- At bedtime and during during any naps      Musculoskeletal:  Unable to assess via camera   Psychiatric Specialty Exam: Physical Exam  Psychiatric: His speech is normal and behavior is normal. Judgment and thought content normal. Cognition and memory are normal. He exhibits a depressed mood.    Review of Systems  Blood pressure (S) (!) 151/93, pulse (S) (!) 103, temperature (S) 97.6 F (36.4 C), temperature source Axillary, resp. rate (S) 20, SpO2 (S) 94 %.There is no height or weight on file to calculate  BMI.  General Appearance: Disheveled  Eye Contact:  Good  Speech:  Clear and Coherent  Volume:  Normal  Mood:  Euthymic  Affect:  Appropriate  Thought Process:  Coherent and Goal Directed  Orientation:  Full (Time, Place, and Person)  Thought Content:  WDL  Suicidal Thoughts:  No  Homicidal Thoughts:  No  Memory:  Immediate;   Good Recent;   Good Remote;   Good  Judgement:  Fair  Insight:  Present  Psychomotor Activity:  Normal  Concentration:  Concentration: Good and Attention Span: Good  Recall:  Good  Fund of Knowledge:  Good  Language:  Good  Akathisia:  No  Handed:  Right  AIMS (if indicated):     Assets:  Communication Skills Desire for Improvement Financial Resources/Insurance Housing Leisure Time Resilience  ADL's:  Intact  Cognition:  WNL  Sleep:        Treatment Plan Summary: Plan Discharge home to [facility] up with [facility].   Disposition: No evidence of imminent risk to self or others at present.   Patient does not meet criteria for psychiatric inpatient admission. Discussed crisis plan, support from social network, calling 911, coming to the Emergency Department, and calling Suicide Hotline.  This service was provided via telemedicine using a 2-way, interactive audio and video technology.  Names of all persons participating in this telemedicine service and their role in this encounter. Name: Elmarie Shiley  Role: Psych NP  Name: Mario [name]  Role: Patient  Name:  Role:   Name:  Role:     Elmarie Shiley, NP 07/15/2019 11:23 AM

## 2019-07-15 NOTE — ED Notes (Signed)
Pt has appt at [facility] in [place] on the 20th that he states he cannot miss- it is for his covid shot, drug screen in order to get referred to pain management dr.

## 2019-07-15 NOTE — ED Notes (Signed)
Breakfast ordered 

## 2019-07-15 NOTE — Discharge Instructions (Addendum)
He was seen in the emergency department for evaluation of feeling suicidal.  You were evaluated by behavioral health and they felt you were safe to be discharged and follow-up with your doctors at the [place].  If you have any worsening or concerning symptoms please return to the emergency department

## 2019-07-15 NOTE — ED Notes (Signed)
Per Psych NP note and via secure chat, ok for pt to be discharged home with [facility] services. EDP notified of this plan of care pending IVC rescind. Pt aware of plan for d/c today, states he will take an uber home and has money to pay for these services.

## 2019-07-15 NOTE — ED Notes (Addendum)
Patient verbalizes understanding of discharge instructions. Opportunity for questioning and answers were provided. Armband removed by staff, pt discharged from ED to home via EMS, pt uses personal phone returned from security to call, states he has money with him to pay for this. Pt is calm, cooperative, ambulatory with cane at d/c. Belongings returned fromlocker 3 and security office. Reviewed discharge instructions, resources, and need [facility] with [facility]. Fax confirmation of rescind of IVC prior to dc

## 2019-07-15 NOTE — ED Provider Notes (Signed)
Emergency Medicine Observation Re-evaluation Note  Mario [name] is a 69 y.o. male, seen on rounds today.  Pt initially presented to the ED for complaints of Shortness of Breath and Medical Clearance Currently, the patient is medically stable.  Physical Exam  BP (!) 143/88 (BP Location: Left Arm)   Pulse 92   Temp (!) 97.5 F (36.4 C) (Axillary)   Resp 18   SpO2 96%  Physical Exam  ED Course / MDM  EKG:EKG Interpretation  Date/Time:  Thursday Jul 11 2019 20:43:05 EDT Ventricular Rate:  103 PR Interval:  184 QRS Duration: 112 QT Interval:  348 QTC Calculation: 455 R Axis:   54 Text Interpretation: Sinus tachycardia Otherwise normal ECG No significant change since last tracing Confirmed by Merrily Pew [phone]) on 07/12/2019 1:56:54 AM    I have reviewed the labs performed to date as well as medications administered while in observation.  Recent changes in the last 24 hours include behavioral health eval. Plan  Current plan is for for discharge.  Outpatient follow-up in the [place] system. Patient is under full IVC and I will rescind it   Hayden Rasmussen, MD 07/15/19 1304

## 2019-12-25 ENCOUNTER — Inpatient Hospital Stay (HOSPITAL_COMMUNITY)
Admission: EM | Admit: 2019-12-25 | Discharge: 2019-12-28 | DRG: 149 | Disposition: A | Discharge status: Home Health | Payer: No Typology Code available for payment source | Attending: Internal Medicine | Admitting: Internal Medicine

## 2019-12-25 ENCOUNTER — Emergency Department (HOSPITAL_COMMUNITY): Payer: No Typology Code available for payment source

## 2019-12-25 ENCOUNTER — Encounter (HOSPITAL_COMMUNITY): Payer: Self-pay

## 2019-12-25 ENCOUNTER — Other Ambulatory Visit: Payer: Self-pay

## 2019-12-25 DIAGNOSIS — H8112 Benign paroxysmal vertigo, left ear: Principal | ICD-10-CM | POA: Diagnosis present

## 2019-12-25 DIAGNOSIS — R918 Other nonspecific abnormal finding of lung field: Secondary | ICD-10-CM

## 2019-12-25 DIAGNOSIS — J449 Chronic obstructive pulmonary disease, unspecified: Secondary | ICD-10-CM | POA: Diagnosis present

## 2019-12-25 DIAGNOSIS — F32A Depression, unspecified: Secondary | ICD-10-CM | POA: Diagnosis present

## 2019-12-25 DIAGNOSIS — N1831 Chronic kidney disease, stage 3a: Secondary | ICD-10-CM | POA: Diagnosis present

## 2019-12-25 DIAGNOSIS — I951 Orthostatic hypotension: Secondary | ICD-10-CM | POA: Diagnosis present

## 2019-12-25 DIAGNOSIS — E1159 Type 2 diabetes mellitus with other circulatory complications: Secondary | ICD-10-CM | POA: Diagnosis present

## 2019-12-25 DIAGNOSIS — H209 Unspecified iridocyclitis: Secondary | ICD-10-CM | POA: Diagnosis present

## 2019-12-25 DIAGNOSIS — F17210 Nicotine dependence, cigarettes, uncomplicated: Secondary | ICD-10-CM | POA: Diagnosis present

## 2019-12-25 DIAGNOSIS — R0902 Hypoxemia: Secondary | ICD-10-CM | POA: Diagnosis present

## 2019-12-25 DIAGNOSIS — H538 Other visual disturbances: Secondary | ICD-10-CM | POA: Diagnosis present

## 2019-12-25 DIAGNOSIS — I252 Old myocardial infarction: Secondary | ICD-10-CM

## 2019-12-25 DIAGNOSIS — R42 Dizziness and giddiness: Secondary | ICD-10-CM | POA: Diagnosis not present

## 2019-12-25 DIAGNOSIS — J440 Chronic obstructive pulmonary disease with acute lower respiratory infection: Secondary | ICD-10-CM | POA: Diagnosis present

## 2019-12-25 DIAGNOSIS — F419 Anxiety disorder, unspecified: Secondary | ICD-10-CM | POA: Diagnosis present

## 2019-12-25 DIAGNOSIS — E119 Type 2 diabetes mellitus without complications: Secondary | ICD-10-CM

## 2019-12-25 DIAGNOSIS — J189 Pneumonia, unspecified organism: Secondary | ICD-10-CM | POA: Diagnosis present

## 2019-12-25 DIAGNOSIS — I10 Essential (primary) hypertension: Secondary | ICD-10-CM | POA: Diagnosis present

## 2019-12-25 DIAGNOSIS — Z85828 Personal history of other malignant neoplasm of skin: Secondary | ICD-10-CM

## 2019-12-25 DIAGNOSIS — Z79899 Other long term (current) drug therapy: Secondary | ICD-10-CM

## 2019-12-25 DIAGNOSIS — Z20822 Contact with and (suspected) exposure to covid-19: Secondary | ICD-10-CM | POA: Diagnosis present

## 2019-12-25 DIAGNOSIS — Z6841 Body Mass Index (BMI) 40.0 and over, adult: Secondary | ICD-10-CM

## 2019-12-25 DIAGNOSIS — Z888 Allergy status to other drugs, medicaments and biological substances status: Secondary | ICD-10-CM

## 2019-12-25 DIAGNOSIS — I129 Hypertensive chronic kidney disease with stage 1 through stage 4 chronic kidney disease, or unspecified chronic kidney disease: Secondary | ICD-10-CM | POA: Diagnosis present

## 2019-12-25 DIAGNOSIS — G4733 Obstructive sleep apnea (adult) (pediatric): Secondary | ICD-10-CM | POA: Diagnosis present

## 2019-12-25 DIAGNOSIS — E1122 Type 2 diabetes mellitus with diabetic chronic kidney disease: Secondary | ICD-10-CM | POA: Diagnosis present

## 2019-12-25 DIAGNOSIS — R9389 Abnormal findings on diagnostic imaging of other specified body structures: Secondary | ICD-10-CM | POA: Diagnosis present

## 2019-12-25 DIAGNOSIS — R682 Dry mouth, unspecified: Secondary | ICD-10-CM | POA: Diagnosis present

## 2019-12-25 DIAGNOSIS — Z7982 Long term (current) use of aspirin: Secondary | ICD-10-CM

## 2019-12-25 DIAGNOSIS — G8929 Other chronic pain: Secondary | ICD-10-CM | POA: Diagnosis present

## 2019-12-25 HISTORY — DX: Unspecified iridocyclitis: H20.9

## 2019-12-25 LAB — BASIC METABOLIC PANEL
Anion gap: 15 (ref 5–15)
BUN: 21 mg/dL (ref 8–23)
CO2: 20 mmol/L — ABNORMAL LOW (ref 22–32)
Calcium: 9.1 mg/dL (ref 8.9–10.3)
Chloride: 99 mmol/L (ref 98–111)
Creatinine, Ser: 1.38 mg/dL — ABNORMAL HIGH (ref 0.61–1.24)
GFR, Estimated: 56 mL/min — ABNORMAL LOW (ref >60)
Glucose, Bld: 165 mg/dL — ABNORMAL HIGH (ref 70–99)
Potassium: 4.6 mmol/L (ref 3.5–5.1)
Sodium: 134 mmol/L — ABNORMAL LOW (ref 135–145)

## 2019-12-25 LAB — CBC
HCT: 47.4 % (ref 39.0–52.0)
Hemoglobin: 15.6 g/dL (ref 13.0–17.0)
MCH: 32.4 pg (ref 26.0–34.0)
MCHC: 32.9 g/dL (ref 30.0–36.0)
MCV: 98.5 fL (ref 80.0–100.0)
Platelets: 315 10*3/uL (ref 150–400)
RBC: 4.81 MIL/uL (ref 4.22–5.81)
RDW: 13.5 % (ref 11.5–15.5)
WBC: 14.6 10*3/uL — ABNORMAL HIGH (ref 4.0–10.5)
nRBC: 0 % (ref 0.0–0.2)

## 2019-12-25 LAB — URINALYSIS, ROUTINE W REFLEX MICROSCOPIC
Bilirubin Urine: NEGATIVE
Glucose, UA: NEGATIVE mg/dL
Hgb urine dipstick: NEGATIVE
Ketones, ur: 5 mg/dL — AB
Leukocytes,Ua: NEGATIVE
Nitrite: NEGATIVE
Protein, ur: NEGATIVE mg/dL
Specific Gravity, Urine: 1.025 (ref 1.005–1.030)
pH: 5 (ref 5.0–8.0)

## 2019-12-25 LAB — BRAIN NATRIURETIC PEPTIDE: B Natriuretic Peptide: 27.3 pg/mL (ref 0.0–100.0)

## 2019-12-25 LAB — TROPONIN I (HIGH SENSITIVITY): Troponin I (High Sensitivity): 6 ng/L (ref <18)

## 2019-12-25 NOTE — ED Provider Notes (Signed)
[facility] EMERGENCY DEPARTMENT Provider Note   CSN: [id number] Arrival date & time: 12/25/19  [place]     History Chief Complaint  Patient presents with  . Dizziness    Mario [name] is a 69 y.o. male.  HPI   Pt states he was having trouble with feeling lightheaded.  The symptoms started around 1 pm.  Pt first noticed that he was feeling short of breath.    He noticed it when he was walking in to another room.  He felt like he had to catch his breath.  He also noticed he was getting lightheaded especially when he sat down.  He also had ringing in his ears and a dry mouth.  [facility] Past Medical History:  Diagnosis Date  . Anxiety   . Bronchitis   . COPD (chronic obstructive pulmonary disease) ([place])   . Depression   . Hypertension   . Mental disorder   . MI (myocardial infarction) ([place])    ????  . OSA (obstructive sleep apnea)   . Suicide attempt ([place])   . Tension pneumothorax 06/27/2016    Patient Active Problem List   Diagnosis Date Noted  . COPD (chronic obstructive pulmonary disease) ([place])   . AKI (acute kidney injury) ([place])   . Hyperglycemia 06/23/2019  . OSA (obstructive sleep apnea) 06/23/2019  . Suicidal ideations 06/23/2019  . Major depressive disorder, recurrent episode ([place]) 10/12/2018  . 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 Cellulitis of both feet 07/16/2017  . DM2 (diabetes mellitus, type 2) ([place]) 07/16/2017  . HLA B27 (HLA B27 positive) 07/16/2017  . Acute chest pain   . Hyperkalemia   . Spontaneous pneumothorax 06/27/2016  . Hypoxia   . Pneumothorax on left   . Dyspnea 05/23/2014  . COPD exacerbation ([place]) 05/23/2014  . Malingering 01/21/2013  . Depression 01/11/2013  . Tobacco abuse 01/11/2013  . Obesity, unspecified 01/11/2013  . Chest pain 01/10/2013  . HTN (hypertension) 01/10/2013    Past Surgical History:  Procedure Laterality  Date  . CHEST TUBE INSERTION Left 06/27/2016  . cryptorchidism    . SKIN CANCER EXCISION         Family History  Problem Relation Age of Onset  . Dementia Father     Social History   Tobacco Use  . Smoking status: Current Every Day Smoker    Packs/day: 0.50    Years: 20.00    Pack years: 10.00    Types: Cigarettes  . Smokeless tobacco: Never Used  Vaping Use  . Vaping Use: Never used  Substance Use Topics  . Alcohol use: No    Alcohol/week: 0.0 standard drinks    Comment: denies use of any drugs or alcohol  . Drug use: No    Home Medications Prior to Admission medications   Medication Sig Start Date End Date Taking? Authorizing Provider  albuterol (PROAIR HFA) 108 (90 Base) MCG/ACT inhaler Inhale 2 puffs into the lungs every 6 (six) hours as needed for wheezing or shortness of breath.    [provider]  aspirin EC 81 MG tablet Take 81 mg by mouth daily.    [provider]  buPROPion (WELLBUTRIN XL) 300 MG 24 hr tablet Take 300 mg by mouth daily after breakfast.     [provider]  busPIRone (BUSPAR) 15 MG tablet Take 15 mg by mouth daily.     [provider]  calcium-vitamin D (OSCAL WITH D) 500-200 MG-UNIT tablet Take 1 tablet by mouth 2 (two) times daily with a meal.    [provider]  Cholecalciferol (VITAMIN D-3) 25 MCG (1000 UT) CAPS Take 1,000 Units by mouth daily with breakfast.    [provider]  clobetasol ointment (TEMOVATE) 0.62 % Apply 1 application topically See admin instructions. Apply a small amount to healed areas of legs 2 times a day as directed- avoid face and genitals    [provider]  cyclobenzaprine (FLEXERIL) 10 MG tablet Take 10 mg by mouth at bedtime.    [provider]  Dextran 70-Hypromellose (ARTIFICIAL TEARS PF OP) Place 1-2 drops into both eyes every 2 (two) hours as needed (for dryness).     [provider]  diltiazem (CARDIZEM CD) 180 MG 24 hr capsule Take  180 mg by mouth daily.    [provider]  docusate calcium (SURFAK) 240 MG capsule Take 240 mg by mouth daily.    [provider]  DULoxetine (CYMBALTA) 60 MG capsule Take 1 capsule (60 mg total) by mouth 2 (two) times daily. Patient taking differently: Take 60 mg by mouth 2 (two) times daily.  07/21/17   Lavina Hamman, MD  etanercept (ENBREL) 50 MG/ML injection Inject 50 mg into the skin every Wednesday.     [provider]  folic acid (FOLVITE) 1 MG tablet Take 1 mg by mouth daily.    [provider]  gabapentin (NEURONTIN) 300 MG capsule Take 2 capsules (600 mg total) by mouth 2 (two) times daily. Patient taking differently: Take 900 mg by mouth at bedtime.  07/06/16   Nani Skillern, PA-C  lisinopril (PRINIVIL,ZESTRIL) 20 MG tablet Take 1 tablet (20 mg total) by mouth daily. For high blood pressure Patient taking differently: Take 20 mg by mouth daily.  01/21/13   Nena Polio T, PA-C  lurasidone (LATUDA) 40 MG TABS tablet Take 40 mg by mouth daily after supper.    [provider]  Melatonin 3 MG TABS Take 6 mg by mouth at bedtime.    [provider]  methocarbamol (ROBAXIN) 500 MG tablet Take 500 mg by mouth 3 (three) times daily as needed (for muscular pain).    [provider]  methotrexate (RHEUMATREX) 2.5 MG tablet Take 6 tablets (15 mg total) by mouth every Saturday. Hold until seen by rheumatology  Caution:Chemotherapy. Protect from light. Patient taking differently: Take 15 mg by mouth every Saturday. Caution:Chemotherapy. Protect from light. 07/22/17   Lavina Hamman, MD  Multiple Vitamin (MULTIVITAMIN WITH MINERALS) TABS tablet Take 1 tablet by mouth daily.    [provider]  mycophenolate (CELLCEPT) 250 MG capsule Take 1,500 mg by mouth 2 (two) times daily.    [provider]  nitroGLYCERIN (NITROSTAT) 0.4 MG SL tablet Place 1 tablet (0.4 mg total) under the tongue every 5 (five) minutes as  needed for chest pain. 07/03/19   Danford, Suann Larry, MD  Oxcarbazepine (TRILEPTAL) 300 MG tablet Take 300 mg by mouth 3 (three) times daily.     [provider]  OXYGEN Inhale 2 L/min into the lungs See admin instructions. 2 liters/min at bedtime with CPAP, during naps and all times of exertion    [provider]  PRESCRIPTION MEDICATION CPAP- At bedtime and during during any naps    [provider]  Propylene Glycol (SYSTANE BALANCE) 0.6 % SOLN Place 1 drop into both eyes 4 (four) times  daily.    [provider]  sodium chloride (OCEAN) 0.65 % SOLN nasal spray Place 2 sprays into both nostrils 4 (four) times daily as needed for congestion.    [provider]  Tiotropium Bromide-Olodaterol (STIOLTO RESPIMAT) 2.5-2.5 MCG/ACT AERS Inhale 2 puffs into the lungs daily.     [provider]  traZODone (DESYREL) 50 MG tablet Take 50-75 mg by mouth at bedtime.     [provider]  vitamin C (ASCORBIC ACID) 500 MG tablet Take 500 mg by mouth 2 (two) times daily.     [provider]  vitamin E (VITAMIN E) 180 MG (400 UNITS) capsule Take 400 Units by mouth daily.    [provider]    Allergies    Demerol [meperidine], Zocor [simvastatin], Beet [beta vulgaris], and Liver  Review of Systems   Review of Systems  All other systems reviewed and are negative.   Physical Exam Updated Vital Signs BP 134/81 (BP Location: Left Arm)   Pulse (!) 111   Temp 99 F (37.2 C) (Oral)   Resp 20   SpO2 95%   Physical Exam Vitals and nursing note reviewed.  Constitutional:      General: He is not in acute distress.    Appearance: He is well-developed.  HENT:     Head: Normocephalic and atraumatic.     Right Ear: External ear normal.     Left Ear: External ear normal.  Eyes:     General: No scleral icterus.       Right eye: No discharge.        Left eye: No discharge.     Conjunctiva/sclera: Conjunctivae normal.  Neck:      Trachea: No tracheal deviation.  Cardiovascular:     Rate and Rhythm: Regular rhythm. Tachycardia present.  Pulmonary:     Effort: Pulmonary effort is normal. No respiratory distress.     Breath sounds: Normal breath sounds. No stridor. No wheezing or rales.  Abdominal:     General: Bowel sounds are normal. There is no distension.     Palpations: Abdomen is soft.     Tenderness: There is no abdominal tenderness. There is no guarding or rebound.  Musculoskeletal:        General: No tenderness.     Cervical back: Neck supple.     Right lower leg: Edema present.     Left lower leg: Edema present.     Comments: Mild edema lower extremities  Skin:    General: Skin is warm and dry.     Findings: No rash.  Neurological:     Mental Status: He is alert.     Cranial Nerves: No cranial nerve deficit (no facial droop, extraocular movements intact, no slurred speech).     Sensory: No sensory deficit.     Motor: No abnormal muscle tone or seizure activity.     Coordination: Coordination normal.     ED Results / Procedures / Treatments   Labs (all labs ordered are listed, but only abnormal results are displayed) Labs Reviewed  BASIC METABOLIC PANEL - Abnormal; Notable for the following components:      Result Value   Sodium 134 (*)    CO2 20 (*)    Glucose, Bld 165 (*)    Creatinine, Ser 1.38 (*)    GFR, Estimated 56 (*)    All other components within normal limits  CBC - Abnormal; Notable for the following components:   WBC 14.6 (*)  All other components within normal limits  URINALYSIS, ROUTINE W REFLEX MICROSCOPIC  CBG MONITORING, ED    EKG EKG Interpretation  Date/Time:  Wednesday December 25 2019 19:10:29 EDT Ventricular Rate:  109 PR Interval:  208 QRS Duration: 112 QT Interval:  322 QTC Calculation: 433 R Axis:   39 Text Interpretation: Sinus tachycardia Low voltage QRS Borderline ECG No significant change since last tracing Confirmed by Dorie Rank [id number]) on  12/25/2019 9:58:24 PM   Radiology CT HEAD WO CONTRAST  Result Date: 12/25/2019 CLINICAL DATA:  Nonspecific dizziness.  Lightheadedness. EXAM: CT HEAD WITHOUT CONTRAST TECHNIQUE: Contiguous axial images were obtained from the base of the skull through the vertex without intravenous contrast. COMPARISON:  Brain MRI 07/21/2014 FINDINGS: Brain: Mild generalized atrophy is similar to prior MRI. No intracranial hemorrhage, mass effect, or midline shift. No hydrocephalus. The basilar cisterns are patent. Moderate chronic small vessel ischemia. No evidence of territorial infarct or acute ischemia. No extra-axial or intracranial fluid collection. Vascular: Atherosclerosis of skullbase vasculature without hyperdense vessel or abnormal calcification. Skull: No acute findings. No fracture or destructive lesion. No definite CT findings to left skull base ovoid T2 hyperintensity on prior MRI. Sinuses/Orbits: Previous fluid in right mastoid air cells has resolved. Mastoid air cells are clear. Paranasal sinuses are clear. Right cataract resection. No acute orbital findings. Other: None. IMPRESSION: 1. No acute intracranial abnormality. 2. Unchanged atrophy and chronic small vessel ischemia. Electronically Signed   By: Keith Rake M.D.   On: 12/25/2019 19:54    Procedures Procedures (including critical care time)  Medications Ordered in ED Medications - No data to display  ED Course  I have reviewed the triage vital signs and the nursing notes.  Pertinent labs & imaging results that were available during my care of the patient were reviewed by me and considered in my medical decision making (see chart for details).  Clinical Course as of Dec 24 2321  Wed Dec 25, 2019  2321 BNP is not elevated.  Initial troponin is normal   [JK]  2322 Patient noted to have tachycardia and borderline O2 sats.  Concerned about the possibility of PE.  Will order CT scan to evaluate further   [JK]    Clinical Course User  Index [JK] Dorie Rank, MD   MDM Rules/Calculators/A&P                          Pt presents with dyspnea, lightheadedness.  No pna on cxr.  No signs of CHF.  No anemia or AKI.  Noted to be tachycardic.  Concerning for possible PE.  Will ct scan to evaluate further.  Care turned over to Dr Christy Gentles at change of shift.   If negative anticipate discharge.   Final Clinical Impression(s) / ED Diagnoses pending   Dorie Rank, MD 12/25/19 2325

## 2019-12-25 NOTE — ED Triage Notes (Signed)
Pt BIB [facility] for dizziness and lightheadedness x4 hours, increase SOB x1 day having to use his inhaler more especially with any activity, negative for orthostatic changes, 92% RA   BP 120/80 HR 110 96% 2L [place]  CBG 141 RR 16

## 2019-12-26 ENCOUNTER — Encounter (HOSPITAL_COMMUNITY): Payer: Self-pay | Admitting: Internal Medicine

## 2019-12-26 ENCOUNTER — Observation Stay (HOSPITAL_COMMUNITY): Payer: No Typology Code available for payment source

## 2019-12-26 ENCOUNTER — Emergency Department (HOSPITAL_COMMUNITY): Payer: No Typology Code available for payment source

## 2019-12-26 DIAGNOSIS — N1831 Chronic kidney disease, stage 3a: Secondary | ICD-10-CM | POA: Diagnosis present

## 2019-12-26 DIAGNOSIS — H209 Unspecified iridocyclitis: Secondary | ICD-10-CM | POA: Diagnosis present

## 2019-12-26 DIAGNOSIS — R42 Dizziness and giddiness: Secondary | ICD-10-CM

## 2019-12-26 DIAGNOSIS — R9389 Abnormal findings on diagnostic imaging of other specified body structures: Secondary | ICD-10-CM | POA: Diagnosis present

## 2019-12-26 LAB — RESPIRATORY PANEL BY RT PCR (FLU A&B, COVID)
Influenza A by PCR: NEGATIVE
Influenza B by PCR: NEGATIVE
SARS Coronavirus 2 by RT PCR: NEGATIVE

## 2019-12-26 LAB — GLUCOSE, CAPILLARY
Glucose-Capillary: 97 mg/dL (ref 70–99)
Glucose-Capillary: 124 mg/dL — ABNORMAL HIGH (ref 70–99)

## 2019-12-26 LAB — TSH: TSH: 1.241 u[IU]/mL (ref 0.350–4.500)

## 2019-12-26 LAB — CBG MONITORING, ED: Glucose-Capillary: 134 mg/dL — ABNORMAL HIGH (ref 70–99)

## 2019-12-26 MED ORDER — ACETAMINOPHEN 325 MG PO TABS
650.0000 mg | ORAL_TABLET | ORAL | Status: DC | PRN
  Administered 2019-12-27 – 2019-12-28 (×2): 650 mg via ORAL
  Filled 2019-12-26 (×3): qty 2

## 2019-12-26 MED ORDER — TRAZODONE HCL 50 MG PO TABS
75.0000 mg | ORAL_TABLET | Freq: Every day | ORAL | Status: DC
  Administered 2019-12-26 – 2019-12-27 (×2): 75 mg via ORAL
  Filled 2019-12-26 (×2): qty 2

## 2019-12-26 MED ORDER — POLYVINYL ALCOHOL 1.4 % OP SOLN
1.0000 [drp] | Freq: Three times a day (TID) | OPHTHALMIC | Status: DC
  Administered 2019-12-27 – 2019-12-28 (×3): 1 [drp] via OPHTHALMIC
  Filled 2019-12-26: qty 15

## 2019-12-26 MED ORDER — TIOTROPIUM BROMIDE MONOHYDRATE 18 MCG IN CAPS: 18.0000 ug | ORAL_CAPSULE | Freq: Every day | RESPIRATORY_TRACT | Status: DC

## 2019-12-26 MED ORDER — BUSPIRONE HCL 10 MG PO TABS
15.0000 mg | ORAL_TABLET | Freq: Every day | ORAL | Status: DC
  Administered 2019-12-27 – 2019-12-28 (×2): 15 mg via ORAL
  Filled 2019-12-26 (×2): qty 2

## 2019-12-26 MED ORDER — ACETAMINOPHEN 650 MG RE SUPP: 650.0000 mg | RECTAL | Status: DC | PRN

## 2019-12-26 MED ORDER — ASPIRIN 300 MG RE SUPP: 300.0000 mg | Freq: Every day | RECTAL | Status: DC

## 2019-12-26 MED ORDER — SENNOSIDES-DOCUSATE SODIUM 8.6-50 MG PO TABS: 1.0000 | ORAL_TABLET | Freq: Every evening | ORAL | Status: DC | PRN

## 2019-12-26 MED ORDER — FENTANYL CITRATE (PF) 100 MCG/2ML IJ SOLN
100.0000 ug | Freq: Once | INTRAMUSCULAR | Status: AC
  Administered 2019-12-26: 100 ug via INTRAVENOUS
  Filled 2019-12-26: qty 2

## 2019-12-26 MED ORDER — SODIUM CHLORIDE 0.9 % IV SOLN: INTRAVENOUS | Status: DC

## 2019-12-26 MED ORDER — FLUTICASONE PROPIONATE 50 MCG/ACT NA SUSP
1.0000 | Freq: Every day | NASAL | Status: DC
  Administered 2019-12-27 – 2019-12-28 (×2): 1 via NASAL
  Filled 2019-12-26: qty 16

## 2019-12-26 MED ORDER — GUAIFENESIN 400 MG PO TABS: 400.0000 mg | ORAL_TABLET | Freq: Two times a day (BID) | ORAL | Status: DC

## 2019-12-26 MED ORDER — ACETAMINOPHEN 160 MG/5ML PO SOLN: 650.0000 mg | ORAL | Status: DC | PRN

## 2019-12-26 MED ORDER — DULOXETINE HCL 60 MG PO CPEP
60.0000 mg | ORAL_CAPSULE | Freq: Two times a day (BID) | ORAL | Status: DC
  Administered 2019-12-27 – 2019-12-28 (×4): 60 mg via ORAL
  Filled 2019-12-26 (×4): qty 1

## 2019-12-26 MED ORDER — PROPYLENE GLYCOL 0.6 % OP SOLN: 1.0000 [drp] | Freq: Three times a day (TID) | OPHTHALMIC | Status: DC

## 2019-12-26 MED ORDER — MORPHINE SULFATE (PF) 2 MG/ML IV SOLN
2.0000 mg | INTRAVENOUS | Status: DC | PRN
  Administered 2019-12-26 – 2019-12-28 (×8): 2 mg via INTRAVENOUS
  Filled 2019-12-26 (×9): qty 1

## 2019-12-26 MED ORDER — LACTATED RINGERS IV BOLUS
500.0000 mL | Freq: Once | INTRAVENOUS | Status: AC
  Administered 2019-12-26: 500 mL via INTRAVENOUS

## 2019-12-26 MED ORDER — MELATONIN 3 MG PO TABS
6.0000 mg | ORAL_TABLET | Freq: Every day | ORAL | Status: DC
  Administered 2019-12-26 – 2019-12-27 (×2): 6 mg via ORAL
  Filled 2019-12-26 (×2): qty 2

## 2019-12-26 MED ORDER — IOHEXOL 350 MG/ML SOLN
100.0000 mL | Freq: Once | INTRAVENOUS | Status: AC | PRN
  Administered 2019-12-26: 100 mL via INTRAVENOUS

## 2019-12-26 MED ORDER — ASPIRIN 325 MG PO TABS
325.0000 mg | ORAL_TABLET | Freq: Every day | ORAL | Status: DC
  Administered 2019-12-26 – 2019-12-28 (×3): 325 mg via ORAL
  Filled 2019-12-26 (×3): qty 1

## 2019-12-26 MED ORDER — LORAZEPAM 2 MG/ML IJ SOLN
1.0000 mg | Freq: Once | INTRAMUSCULAR | Status: AC
  Administered 2019-12-26: 1 mg via INTRAVENOUS
  Filled 2019-12-26: qty 1

## 2019-12-26 MED ORDER — TRAMADOL HCL 50 MG PO TABS
50.0000 mg | ORAL_TABLET | Freq: Two times a day (BID) | ORAL | Status: DC
  Administered 2019-12-26 – 2019-12-28 (×4): 50 mg via ORAL
  Filled 2019-12-26 (×4): qty 1

## 2019-12-26 MED ORDER — INSULIN ASPART 100 UNIT/ML ~~LOC~~ SOLN
0.0000 [IU] | Freq: Three times a day (TID) | SUBCUTANEOUS | Status: DC
  Administered 2019-12-27: 3 [IU] via SUBCUTANEOUS
  Administered 2019-12-27 – 2019-12-28 (×2): 2 [IU] via SUBCUTANEOUS

## 2019-12-26 MED ORDER — ONDANSETRON HCL 4 MG/2ML IJ SOLN
4.0000 mg | Freq: Once | INTRAMUSCULAR | Status: AC
  Administered 2019-12-26: 4 mg via INTRAVENOUS
  Filled 2019-12-26: qty 2

## 2019-12-26 MED ORDER — INFLUENZA VAC A&B SA ADJ QUAD 0.5 ML IM PRSY
0.5000 mL | PREFILLED_SYRINGE | INTRAMUSCULAR | Status: DC
  Filled 2019-12-26 (×2): qty 0.5

## 2019-12-26 MED ORDER — ENOXAPARIN SODIUM 40 MG/0.4ML ~~LOC~~ SOLN
40.0000 mg | Freq: Every day | SUBCUTANEOUS | Status: DC
  Administered 2019-12-26 – 2019-12-28 (×3): 40 mg via SUBCUTANEOUS
  Filled 2019-12-26 (×3): qty 0.4

## 2019-12-26 MED ORDER — CYCLOPENTOLATE HCL 1 % OP SOLN
1.0000 [drp] | Freq: Three times a day (TID) | OPHTHALMIC | Status: DC
  Administered 2019-12-26 – 2019-12-28 (×4): 1 [drp] via OPHTHALMIC
  Filled 2019-12-26: qty 2

## 2019-12-26 MED ORDER — CYCLOBENZAPRINE HCL 10 MG PO TABS
10.0000 mg | ORAL_TABLET | Freq: Every day | ORAL | Status: DC
  Administered 2019-12-27 (×2): 10 mg via ORAL
  Filled 2019-12-26 (×3): qty 1

## 2019-12-26 MED ORDER — OXCARBAZEPINE 300 MG PO TABS: 450.0000 mg | ORAL_TABLET | Freq: Three times a day (TID) | ORAL | Status: DC

## 2019-12-26 MED ORDER — LURASIDONE HCL 40 MG PO TABS
40.0000 mg | ORAL_TABLET | Freq: Every day | ORAL | Status: DC
  Administered 2019-12-27 (×2): 40 mg via ORAL
  Filled 2019-12-26 (×4): qty 1

## 2019-12-26 MED ORDER — LISINOPRIL 20 MG PO TABS
20.0000 mg | ORAL_TABLET | Freq: Every day | ORAL | Status: DC
  Administered 2019-12-27: 20 mg via ORAL
  Filled 2019-12-26: qty 1

## 2019-12-26 MED ORDER — PREDNISOLONE ACETATE 1 % OP SUSP
1.0000 [drp] | Freq: Two times a day (BID) | OPHTHALMIC | Status: DC
  Administered 2019-12-26 – 2019-12-28 (×4): 1 [drp] via OPHTHALMIC
  Filled 2019-12-26: qty 5

## 2019-12-26 MED ORDER — UMECLIDINIUM BROMIDE 62.5 MCG/INH IN AEPB
1.0000 | INHALATION_SPRAY | Freq: Every day | RESPIRATORY_TRACT | Status: DC
  Administered 2019-12-27 – 2019-12-28 (×2): 1 via RESPIRATORY_TRACT
  Filled 2019-12-26: qty 7

## 2019-12-26 MED ORDER — POLYETHYLENE GLYCOL 3350 17 G PO PACK: 17.0000 g | PACK | Freq: Every day | ORAL | Status: DC

## 2019-12-26 MED ORDER — BUPROPION HCL ER (XL) 150 MG PO TB24
300.0000 mg | ORAL_TABLET | Freq: Every day | ORAL | Status: DC
  Administered 2019-12-27 – 2019-12-28 (×2): 300 mg via ORAL
  Filled 2019-12-26 (×2): qty 2

## 2019-12-26 MED ORDER — SODIUM CHLORIDE 0.9 % IV SOLN
500.0000 mg | Freq: Once | INTRAVENOUS | Status: AC
  Administered 2019-12-26: 500 mg via INTRAVENOUS
  Filled 2019-12-26 (×2): qty 500

## 2019-12-26 MED ORDER — TAMSULOSIN HCL 0.4 MG PO CAPS
0.4000 mg | ORAL_CAPSULE | Freq: Every day | ORAL | Status: DC
  Administered 2019-12-27 – 2019-12-28 (×2): 0.4 mg via ORAL
  Filled 2019-12-26 (×2): qty 1

## 2019-12-26 MED ORDER — GABAPENTIN 300 MG PO CAPS
900.0000 mg | ORAL_CAPSULE | Freq: Every day | ORAL | Status: DC
  Administered 2019-12-26 – 2019-12-27 (×2): 900 mg via ORAL
  Filled 2019-12-26 (×2): qty 3

## 2019-12-26 MED ORDER — FOLIC ACID 1 MG PO TABS
1.0000 mg | ORAL_TABLET | Freq: Every day | ORAL | Status: DC
  Administered 2019-12-27 – 2019-12-28 (×2): 1 mg via ORAL
  Filled 2019-12-26 (×2): qty 1

## 2019-12-26 MED ORDER — MYCOPHENOLATE MOFETIL 250 MG PO CAPS
1500.0000 mg | ORAL_CAPSULE | Freq: Two times a day (BID) | ORAL | Status: DC
  Administered 2019-12-27 – 2019-12-28 (×4): 1500 mg via ORAL
  Filled 2019-12-26 (×6): qty 6

## 2019-12-26 MED ORDER — SODIUM CHLORIDE 0.9 % IV SOLN
1.0000 g | Freq: Once | INTRAVENOUS | Status: AC
  Administered 2019-12-26: 1 g via INTRAVENOUS
  Filled 2019-12-26: qty 10

## 2019-12-26 MED ORDER — DILTIAZEM HCL ER COATED BEADS 180 MG PO CP24
180.0000 mg | ORAL_CAPSULE | Freq: Every day | ORAL | Status: DC
  Administered 2019-12-27 – 2019-12-28 (×2): 180 mg via ORAL
  Filled 2019-12-26 (×2): qty 1

## 2019-12-26 NOTE — ED Notes (Signed)
Pt transported to MRI 

## 2019-12-26 NOTE — ED Provider Notes (Signed)
I assumed care in signout to follow-up with CT chest.  There is no signs of acute PE, but does have significant changes due to emphysema.  Patient reports he still feels lightheaded upon changing position and headache.  He does report chronically worsening shortness of breath due to emphysema.  He confirms having oxygen at home.  He does not use it every day. Plan to treat headache, check orthostatics and reassess. CT head was already performed and is negative Patient without any gross neuro deficits   Ripley Fraise, MD 12/26/19 [phone]

## 2019-12-26 NOTE — ED Notes (Signed)
Lunch Tray Ordered @ 1042. 

## 2019-12-26 NOTE — Progress Notes (Signed)
Spoke to Dr. Lorin Mercy about issues with patient's IV access for exam contrast. Per Dr. Lorin Mercy ok to change exam to W/O.

## 2019-12-26 NOTE — ED Notes (Signed)
Water given to patient

## 2019-12-26 NOTE — H&P (Addendum)
History and Physical    Mario [name] ONG:[id number] DOB: [birth year] DOA: 12/25/2019  PCP: [facility] Consultants:  Jeanie Cooks - Pain management Patient coming from:  Home - lives with roommate; NOK: None  Chief Complaint: Dizziness  HPI: Mario [name] is a 69 y.o. male with medical history significant of OSA; CAD; depression with h/o suicidality; HTN; and COPD presenting with dizziness.  He reports that he was light, headed, dizzy, SOB.  He called the [place] and triage nurse told him to come to the ER.  Symptoms started about 1pm yesterday.  Dizziness was mostly from standing to sitting.  A while ago, he was really dizzy, maybe after 4pm.  He has dry eyes and so isn't sure is his vision is blurry.  Eating and drinking regularly.  He checked his blood sugar and it was 95.  BP was maybe a little low (for him).  +frontal headache.  His legs feel weak.  No change in cough.  +SOB yesterday, worse than usual from COPD.  He uses a concentrator with 2L with CPAP but no daytime O2.  No fevers or other signs of infection (other than L eye infection, uveitis, being treated for this for a couple of weeks).  He has recurrent uveitis, has abnormal rheum marker and is being treated by rheum for this.      ED Course:  Carryover, per Dr. Marlowe Sax:  Patient with history of COPD presented with complaints of dizziness/vertigo-like symptoms and shortness of breath. Head CT negative. CT angiogram negative for PE but showing worsening emphysema and superimposed infiltrate. He has been started on antibiotics for coverage of pneumonia. Continues to have vertigo symptoms and cannot go home. Brain MRI needed for further evaluation.   Review of Systems: As per HPI; otherwise review of systems reviewed and negative.   Ambulatory Status:  Ambulates with a cane  COVID Vaccine Status:  Complete  Past Medical History:  Diagnosis Date  . Anxiety   . Bronchitis   . COPD (chronic obstructive pulmonary disease)  ([place])   . Depression   . Hypertension   . Mental disorder   . MI (myocardial infarction) ([place])    ????  . OSA (obstructive sleep apnea)   . Suicide attempt ([place])   . Tension pneumothorax 06/27/2016  . Uveitis     Past Surgical History:  Procedure Laterality Date  . CHEST TUBE INSERTION Left 06/27/2016  . cryptorchidism    . SKIN CANCER EXCISION      Social History   Socioeconomic History  . Marital status: Single    Spouse name: Not on file  . Number of children: Not on file  . Years of education: Not on file  . Highest education level: Not on file  Occupational History  . Occupation: retired  Tobacco Use  . Smoking status: Former Smoker    Packs/day: 1.00    Years: 35.00    Pack years: 35.00    Types: Cigarettes    Quit date: 05/2016    Years since quitting: 3.5  . Smokeless tobacco: Never Used  Vaping Use  . Vaping Use: Never used  Substance and Sexual Activity  . Alcohol use: No    Alcohol/week: 0.0 standard drinks    Comment: denies use of any drugs or alcohol  . Drug use: No  . Sexual activity: Not on file  Other Topics Concern  . Not on file  Social History Narrative  . Not on file   Social Determinants of Health  Financial Resource Strain:   . Difficulty of Paying Living Expenses: Not on file  Food Insecurity:   . Worried About Charity fundraiser in the Last Year: Not on file  . Ran Out of Food in the Last Year: Not on file  Transportation Needs:   . Lack of Transportation (Medical): Not on file  . Lack of Transportation (Non-Medical): Not on file  Physical Activity:   . Days of Exercise per Week: Not on file  . Minutes of Exercise per Session: Not on file  Stress:   . Feeling of Stress : Not on file  Social Connections:   . Frequency of Communication with Friends and Family: Not on file  . Frequency of Social Gatherings with Friends and Family: Not on file  . Attends Religious Services: Not on file  . Active Member of Clubs or  Organizations: Not on file  . Attends Archivist Meetings: Not on file  . Marital Status: Not on file  Intimate Partner Violence:   . Fear of Current or Ex-Partner: Not on file  . Emotionally Abused: Not on file  . Physically Abused: Not on file  . Sexually Abused: Not on file    Allergies  Allergen Reactions  . Demerol [Meperidine] Nausea And Vomiting and Other (See Comments)    Made the patient "violently sick"  . Zocor [Simvastatin] Nausea And Vomiting and Other (See Comments)    Made him very jittery, also  . Beet [Beta Vulgaris] Nausea And Vomiting  . Liver Nausea And Vomiting    Family History  Problem Relation Age of Onset  . Dementia Father     Prior to Admission medications   Medication Sig Start Date End Date Taking? Authorizing Provider  albuterol (PROAIR HFA) 108 (90 Base) MCG/ACT inhaler Inhale 2 puffs into the lungs every 6 (six) hours as needed for wheezing or shortness of breath.    [provider]  aspirin EC 81 MG tablet Take 81 mg by mouth daily.    [provider]  buPROPion (WELLBUTRIN XL) 300 MG 24 hr tablet Take 300 mg by mouth daily after breakfast.     [provider]  busPIRone (BUSPAR) 15 MG tablet Take 15 mg by mouth daily.     [provider]  calcium-vitamin D (OSCAL WITH D) 500-200 MG-UNIT tablet Take 1 tablet by mouth 2 (two) times daily with a meal.    [provider]  Cholecalciferol (VITAMIN D-3) 25 MCG (1000 UT) CAPS Take 1,000 Units by mouth daily with breakfast.    [provider]  clobetasol ointment (TEMOVATE) 5.68 % Apply 1 application topically See admin instructions. Apply a small amount to healed areas of legs 2 times a day as directed- avoid face and genitals    [provider]  cyclobenzaprine (FLEXERIL) 10 MG tablet Take 10 mg by mouth at bedtime.    [provider]  Dextran 70-Hypromellose (ARTIFICIAL TEARS PF OP) Place 1-2 drops into both eyes every 2  (two) hours as needed (for dryness).     [provider]  diltiazem (CARDIZEM CD) 180 MG 24 hr capsule Take 180 mg by mouth daily.    [provider]  docusate calcium (SURFAK) 240 MG capsule Take 240 mg by mouth daily.    [provider]  DULoxetine (CYMBALTA) 60 MG capsule Take 1 capsule (60 mg total) by mouth 2 (two) times daily. Patient taking differently: Take 60 mg by mouth 2 (two) times daily.  07/21/17   Lavina Hamman, MD  etanercept (ENBREL) 50 MG/ML injection Inject 50 mg into the skin every Wednesday.     [provider]  folic acid (FOLVITE) 1 MG tablet Take 1 mg by mouth daily.    [provider]  gabapentin (NEURONTIN) 300 MG capsule Take 2 capsules (600 mg total) by mouth 2 (two) times daily. Patient taking differently: Take 900 mg by mouth at bedtime.  07/06/16   Nani Skillern, PA-C  lisinopril (PRINIVIL,ZESTRIL) 20 MG tablet Take 1 tablet (20 mg total) by mouth daily. For high blood pressure Patient taking differently: Take 20 mg by mouth daily.  01/21/13   Nena Polio T, PA-C  lurasidone (LATUDA) 40 MG TABS tablet Take 40 mg by mouth daily after supper.    [provider]  Melatonin 3 MG TABS Take 6 mg by mouth at bedtime.    [provider]  methocarbamol (ROBAXIN) 500 MG tablet Take 500 mg by mouth 3 (three) times daily as needed (for muscular pain).    [provider]  methotrexate (RHEUMATREX) 2.5 MG tablet Take 6 tablets (15 mg total) by mouth every Saturday. Hold until seen by rheumatology  Caution:Chemotherapy. Protect from light. Patient taking differently: Take 15 mg by mouth every Saturday. Caution:Chemotherapy. Protect from light. 07/22/17   Lavina Hamman, MD  Multiple Vitamin (MULTIVITAMIN WITH MINERALS) TABS tablet Take 1 tablet by mouth daily.    [provider]  mycophenolate (CELLCEPT) 250 MG capsule Take 1,500 mg by mouth 2 (two) times daily.    [provider]   nitroGLYCERIN (NITROSTAT) 0.4 MG SL tablet Place 1 tablet (0.4 mg total) under the tongue every 5 (five) minutes as needed for chest pain. 07/03/19   Danford, Suann Larry, MD  Oxcarbazepine (TRILEPTAL) 300 MG tablet Take 300 mg by mouth 3 (three) times daily.     [provider]  OXYGEN Inhale 2 L/min into the lungs See admin instructions. 2 liters/min at bedtime with CPAP, during naps and all times of exertion    [provider]  PRESCRIPTION MEDICATION CPAP- At bedtime and during during any naps    [provider]  Propylene Glycol (SYSTANE BALANCE) 0.6 % SOLN Place 1 drop into both eyes 4 (four) times daily.    [provider]  sodium chloride (OCEAN) 0.65 % SOLN nasal spray Place 2 sprays into both nostrils 4 (four) times daily as needed for congestion.    [provider]  Tiotropium Bromide-Olodaterol (STIOLTO RESPIMAT) 2.5-2.5 MCG/ACT AERS Inhale 2 puffs into the lungs daily.     [provider]  traZODone (DESYREL) 50 MG tablet Take 50-75 mg by mouth at bedtime.     [provider]  vitamin C (ASCORBIC ACID) 500 MG tablet Take 500 mg by mouth 2 (two) times daily.     [provider]  vitamin E (VITAMIN E) 180 MG (400 UNITS) capsule Take 400 Units by mouth daily.    [provider]    Physical Exam: Vitals:   12/26/19 1400 12/26/19 1415 12/26/19 1500 12/26/19 1619  BP: (!) 157/100 (!) 141/89 (!) 154/104 (!) 147/93  Pulse: (!) 112 (!) 110 (!) 107 (!) 107  Resp:    20  Temp:      TempSrc:      SpO2: (!) 86% (!) 89% 95%      . General:  Appears calm and comfortable and is NAD . Eyes:  PERRL, EOMI, normal lids, iris; mildly  injected L > R conjunctivae . ENT:  grossly normal hearing, lips & tongue, mmm; mostly absent dentition . Neck:  no LAD, masses or thyromegaly . Cardiovascular:  RRR, no m/r/g. No LE edema.  [name] Respiratory:   CTA bilaterally with no wheezes/rales/rhonchi.  Normal respiratory  effort. . Abdomen:  soft, NT, ND, NABS . Skin:  no rash or induration seen on limited exam . Musculoskeletal:  grossly normal tone BUE/BLE, good ROM, no bony abnormality . Psychiatric:  blunted mood and affect, speech fluent and appropriate, AOx3 . Neurologic:  CN 2-12 grossly intact, moves all extremities in coordinated fashion    Radiological Exams on Admission: Independently reviewed - see discussion in A/P where applicable  CT HEAD WO CONTRAST  Result Date: 12/25/2019 CLINICAL DATA:  Nonspecific dizziness.  Lightheadedness. EXAM: CT HEAD WITHOUT CONTRAST TECHNIQUE: Contiguous axial images were obtained from the base of the skull through the vertex without intravenous contrast. COMPARISON:  Brain MRI 07/21/2014 FINDINGS: Brain: Mild generalized atrophy is similar to prior MRI. No intracranial hemorrhage, mass effect, or midline shift. No hydrocephalus. The basilar cisterns are patent. Moderate chronic small vessel ischemia. No evidence of territorial infarct or acute ischemia. No extra-axial or intracranial fluid collection. Vascular: Atherosclerosis of skullbase vasculature without hyperdense vessel or abnormal calcification. Skull: No acute findings. No fracture or destructive lesion. No definite CT findings to left skull base ovoid T2 hyperintensity on prior MRI. Sinuses/Orbits: Previous fluid in right mastoid air cells has resolved. Mastoid air cells are clear. Paranasal sinuses are clear. Right cataract resection. No acute orbital findings. Other: None. IMPRESSION: 1. No acute intracranial abnormality. 2. Unchanged atrophy and chronic small vessel ischemia. Electronically Signed   By: Keith Rake M.D.   On: 12/25/2019 19:54   CT Angio Chest PE W and/or Wo Contrast  Result Date: 12/26/2019 CLINICAL DATA:  PE suspected, high prob Shortness of breath.  Dizziness and lightheaded. EXAM: CT ANGIOGRAPHY CHEST WITH CONTRAST TECHNIQUE: Multidetector CT imaging of the chest was performed using  the standard protocol during bolus administration of intravenous contrast. Multiplanar CT image reconstructions and MIPs were obtained to evaluate the vascular anatomy. CONTRAST:  154m OMNIPAQUE IOHEXOL 350 MG/ML SOLN COMPARISON:  Radiograph yesterday.  Chest CTA 09/17/2016 FINDINGS: Cardiovascular: There are no filling defects within the pulmonary arteries to suggest pulmonary embolus. Basilar assessment is partially obscured by breathing motion artifact. Thoracic aorta is normal in caliber. No aortic dissection. There is irregular calcified and noncalcified plaque involving the descending thoracic aorta. No evidence of vasculitis or penetrating ulcer. Upper normal heart size. No pericardial effusion. Coronary artery calcifications are seen. Again seen variant venous drainage on intercostal vein into the brachiocephalic, unchanged. Mediastinum/Nodes: Multiple small mediastinal lymph nodes not enlarged by size criteria. No hilar adenopathy. No esophageal wall thickening. No suspicious thyroid nodule. Lungs/Pleura: Moderately advanced emphysema. There is a thick-walled cavitary process in the periphery of the left lower lobe, series 13, image 72. This measures 3.8 cm on the current exam, increased in size from 2 cm previously. Subpleural bleb in the right lower lobe has increased in size from 2018 with adjacent chronic atelectasis/scarring. Mild chronic scarring in the lingula. Mild elevation of the right hemidiaphragm with adjacent compressive atelectasis. No pleural fluid. No pneumothorax. Upper Abdomen: Prominent size liver with steatosis, partially included. No acute findings. Musculoskeletal: Flowing syndesmophytes throughout the thoracic spine. There are no acute or suspicious osseous abnormalities. Review of the MIP images confirms the above findings. IMPRESSION: 1. No pulmonary embolus. 2. Moderately advanced emphysema.  Thick-walled cavitary process in the periphery of the left lower lobe has increased in  size from 2018 CT, currently 3.8 cm, previously 2 cm. This may represent an area of chronic scarring given prior chest tube placement in this region, however increased size is worrisome for malignancy or potentially superimposed infectious process. Recommend either short-term follow-up CT after course of treatment or further characterization with PET CT. 3. Subpleural bleb in the right lower lobe has increased in size from 2018 with adjacent compressive atelectasis/scarring. Aortic Atherosclerosis (ICD10-I70.0) and Emphysema (ICD10-J43.9). Electronically Signed   By: Keith Rake M.D.   On: 12/26/2019 02:28   MR ANGIO HEAD WO CONTRAST  Result Date: 12/26/2019 CLINICAL DATA:  Dizziness EXAM: MRI HEAD WITHOUT CONTRAST MRA HEAD WITHOUT CONTRAST TECHNIQUE: Multiplanar, multiecho pulse sequences of the brain and surrounding structures were obtained without intravenous contrast. Angiographic images of the head were obtained using MRA technique without contrast. COMPARISON:  CT head 12/25/2019. FINDINGS: MRI HEAD FINDINGS Brain: No acute infarction, hemorrhage, hydrocephalus, extra-axial collection or mass lesion. Mild generalized volume loss with ex vacuo ventricular dilation. Scattered T2/FLAIR hyperintensities within the white matter, compatible with chronic microvascular ischemic disease. Vascular: Major arterial flow voids are maintained at the skull base. Skull and upper cervical spine: Normal marrow signal. Sinuses/Orbits: Sinuses are clear.  Unremarkable orbits. Other: No mastoid effusions. Small amount of suspected trapped fluid at the left petrous apex. MRA HEAD FINDINGS Anterior circulation: Limited evaluation of the ICAs proximal to the cavernous segment secondary to artifact. Within this limitation, no evidence of hemodynamically significant proximal stenosis or large vessel occlusion. No aneurysm identified. Posterior circulation: No evidence of hemodynamically significant stenosis or large vessel  occlusion. Mild irregularity of the basilar artery and bilateral posterior cerebral arteries, likely related to atherosclerosis. Flow related signal identified bilateral posteroinferior cerebral arteries. IMPRESSION: 1. No acute intracranial abnormality. Specifically, no acute infarct. 2. No evidence of hemodynamically significant stenosis or large vessel occlusion, within limitations detailed above. 3. Generalized cerebral volume loss. Electronically Signed   By: Margaretha Sheffield MD   On: 12/26/2019 14:49   MR BRAIN WO CONTRAST  Result Date: 12/26/2019 CLINICAL DATA:  Dizziness EXAM: MRI HEAD WITHOUT CONTRAST MRA HEAD WITHOUT CONTRAST TECHNIQUE: Multiplanar, multiecho pulse sequences of the brain and surrounding structures were obtained without intravenous contrast. Angiographic images of the head were obtained using MRA technique without contrast. COMPARISON:  CT head 12/25/2019. FINDINGS: MRI HEAD FINDINGS Brain: No acute infarction, hemorrhage, hydrocephalus, extra-axial collection or mass lesion. Mild generalized volume loss with ex vacuo ventricular dilation. Scattered T2/FLAIR hyperintensities within the white matter, compatible with chronic microvascular ischemic disease. Vascular: Major arterial flow voids are maintained at the skull base. Skull and upper cervical spine: Normal marrow signal. Sinuses/Orbits: Sinuses are clear.  Unremarkable orbits. Other: No mastoid effusions. Small amount of suspected trapped fluid at the left petrous apex. MRA HEAD FINDINGS Anterior circulation: Limited evaluation of the ICAs proximal to the cavernous segment secondary to artifact. Within this limitation, no evidence of hemodynamically significant proximal stenosis or large vessel occlusion. No aneurysm identified. Posterior circulation: No evidence of hemodynamically significant stenosis or large vessel occlusion. Mild irregularity of the basilar artery and bilateral posterior cerebral arteries, likely related to  atherosclerosis. Flow related signal identified bilateral posteroinferior cerebral arteries. IMPRESSION: 1. No acute intracranial abnormality. Specifically, no acute infarct. 2. No evidence of hemodynamically significant stenosis or large vessel occlusion, within limitations detailed above. 3. Generalized cerebral volume loss. Electronically Signed   By: Margaretha Sheffield MD  On: 12/26/2019 14:49   DG Chest Portable 1 View  Result Date: 12/25/2019 CLINICAL DATA:  Shortness of breath EXAM: PORTABLE CHEST 1 VIEW COMPARISON:  07/11/2019 FINDINGS: The heart size and mediastinal contours are within normal limits. Scarring at the left lung base. The visualized skeletal structures are unremarkable. IMPRESSION: No active disease. Electronically Signed   By: Ulyses Jarred M.D.   On: 12/25/2019 22:22    EKG: Independently reviewed.  Sinus tachycardia with rate 109; low voltage with no evidence of acute ischemia   Labs on Admission: I have personally reviewed the available labs and imaging studies at the time of the admission.  Pertinent labs:   Glucose 165 BUN 21/Creatinine 1.38/GFR 56 - stable BNP 27.3 HS troponin 6 WBC 14.6 UA: 5 ketones COVID/flu negative   Assessment/Plan Principal Problem:   Vertigo Active Problems:   HTN (hypertension)   Depression   DM2 (diabetes mellitus, type 2) (HCC)   OSA (obstructive sleep apnea)   COPD (chronic obstructive pulmonary disease) (HCC)   Uveitis of both eyes   Stage 3a chronic kidney disease (HCC)   Abnormal CT of the chest   Vertigo -Patient presenting with acute onset of dizziness, light headedness -Negative head CT, but somewhat concerning given new onset of headache and vertigo -As such, MRI/MRA ordered and negative -PT vesitbular evaluation ordered -Will observe on telemetry overnight, anticipate d/c to home tomorrow -PT/OT/ST Consults -Risk stratification with A1c, FLP; will also check TSH and UDS  Abnormal chest CT -Patient did  not report respiratory symptoms to me but there was a CTA ordered and it showed moderately advanced emphysema with a thick-walled cavitary process in the LLL that is increased in size; short-term f/u with repeat CT/PET is recommended -Dr. Valeta Harms from pulmonology was notified and is happy to arrange for short-interval f/u in the pulmonology clinic - his assistance is greatly appreciated -The patient was notified of this finding and of the need for f/u accordingly -He was given one dose of antibiotics in the ER but these were not continued due to low suspicion of active infection at this time.  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 -This is commonly associated with anterior uveitis -Followed by rheumatology -Continue home meds - Enbrel, Methotrexate (with folate), Cellcept, Cyclopentolate, Predforte, Systane  Stage 3a CKD -Appears to be stable at this time -Will follow  OSA -Continue CPAP -Uses 2L O2 with CPAP  COPD -Advanced COPD based on CT -Continue Humibid -Incruse Ellipta substituted for Stiolto due to formulary  Obesity  -BMI 42.5 -Weight loss should be encouraged -Outpatient PCP/bariatric medicine/bariatric surgery f/u encouraged  Chronic pain -Continue Flexeril, Cymbalta, Neurontin, Ultram -Hold Naprosyn for now    Note: This patient has been tested and is negative for the novel coronavirus COVID-19. He has been fully vaccinated against COVID-19.    DVT prophylaxis:  Lovenox  Code Status: Full - confirmed with patient Family Communication: None present Disposition Plan:  The patient is from: home  Anticipated d/c is to: home without [facility]  services   Anticipated d/c date will depend on clinical response to treatment, but possibly as early as tomorrow if he has excellent response to treatment  Patient is currently: acutely ill Consults called:  PT/OT/ST Admission status:  It is my clinical opinion that referral for OBSERVATION is reasonable and necessary in this patient based on the above information provided. The aforementioned taken together are felt to place the patient at high risk for further clinical deterioration. However it is anticipated that the patient may be medically stable for discharge from the hospital within 24 to 48 hours.     Karmen Bongo MD [facility]   How to contact the [facility] Attending or Consulting provider [place] or covering provider during after hours [place], for this patient?  1. Check the care team in [facility] and look for a) attending/consulting [facility] provider listed and b) the [facility] team listed 2. Log into [email or web address] and use [place]'s universal password to access. If you do not have the password, please contact the hospital operator. 3. Locate the [facility] provider you are looking for under [facility] and page to a number that you can be directly reached. 4. If you still have difficulty reaching the provider, please page the [facility] (Director on Call) for the Hospitalists listed on amion for assistance.   12/26/2019, 5:35 PM

## 2019-12-26 NOTE — ED Notes (Signed)
Patient transported to CT 

## 2019-12-26 NOTE — Evaluation (Signed)
Physical Therapy Evaluation Patient Details Name: Mario [name] MRN: [id number] DOB: Jan 06, 1951 Today's Date: 12/26/2019   History of Present Illness  69yo male c/o feeling lightheaded and SOB, referred to ED by [facility]. PE negative. Developed dizziness while in the ED, MRA/MRI negative. PMH anxiety, HTN, mental disorder, MI, hx suicide attempt, MDD, malingering, obesity  Clinical Impression   Patient received in ED stretcher, pleasant and cooperative but with apparent slow processing and possible STM deficit; gives me very unclear answers today and was very difficult to perform an accurate vestibular assessment with today, often telling me that he "doesn't know" or that conflicting things make him less or more dizzy. Did not really see any signs of concern vestibular wise with limited testing today, however when sitting at EOB his BP did raise to 167/112- also note that HR was intermittently tachycardic and SPO2 did fluctuate between 86-94% on 2LPM this session. In flowsheets it does look like his BP did drop quite a bit with positional changes. At end of session he also told me that he always feels this light headed and dizzy- "its my regular. Again, very hard to perform accurate vestibular assessment on him due to unclear answers today. Right now suspect that dizziness may be from a non-vestibular source, but will continue to follow acutely. Left in ED stretcher with RN aware of patient status.      Follow Up Recommendations Other (comment) (HHPT vs OP PT)    Equipment Recommendations  None recommended by PT    Recommendations for Other Services       Precautions / Restrictions Precautions Precautions: Fall;Other (comment) Precaution Comments: watch vitals Restrictions Weight Bearing Restrictions: No      Mobility  Bed Mobility Overal bed mobility: Needs Assistance Bed Mobility: Supine to Sit;Sit to Supine     Supine to sit: Min assist Sit to supine: Min guard   General bed  mobility comments: MinA to get to edge of ED stretcher, however able to lay self down with min guard    Transfers                 General transfer comment: deferred- BP 167/112 sitting EOB  Ambulation/Gait             General Gait Details: deferred- BP 167/112 sitting EOB  Stairs            Wheelchair Mobility    Modified Rankin (Stroke Patients Only)       Balance Overall balance assessment: Needs assistance Sitting-balance support: Bilateral upper extremity supported;Feet unsupported Sitting balance-Leahy Scale: Fair Sitting balance - Comments: mild posterior unsteadiness with feet unsupported Postural control: Posterior lean     Standing balance comment: deferred at eval due to elevated BP                             Pertinent Vitals/Pain Pain Assessment: No/denies pain    Home Living Family/patient expects to be discharged to:: Private residence Living Arrangements: Non-relatives/Friends Available Help at Discharge: Other (Comment) (in and out room mate) Type of Home: Apartment Home Access: Stairs to enter Entrance Stairs-Rails: Right Entrance Stairs-Number of Steps: 4-5 ("they are nasty ones, they are out of code") Home Layout: One level Home Equipment: Walker - 2 wheels;Cane - single point      Prior Function Level of Independence: Independent with assistive device(s)         Comments: pt utilizies cane outside of apartment  Hand Dominance   Dominant Hand: Right    Extremity/Trunk Assessment   Upper Extremity Assessment Upper Extremity Assessment: Defer to OT evaluation    Lower Extremity Assessment Lower Extremity Assessment: Generalized weakness    Cervical / Trunk Assessment Cervical / Trunk Assessment: Kyphotic  Communication   Communication: No difficulties  Cognition Arousal/Alertness: Awake/alert Behavior During Therapy: Flat affect;Impulsive Overall Cognitive Status: No family/caregiver present to  determine baseline cognitive functioning Area of Impairment: Attention;Memory;Following commands;Safety/judgement;Awareness;Problem solving                   Current Attention Level: Sustained Memory: Decreased short-term memory;Decreased recall of precautions Following Commands: Follows one step commands consistently;Follows one step commands with increased time Safety/Judgement: Decreased awareness of safety Awareness: Intellectual Problem Solving: Slow processing;Difficulty sequencing;Requires verbal cues General Comments: slow processing and needed repetitive cues to follow commands today; often tells me "I don't know" or was very unclear with his answers in general      General Comments      Exercises     Assessment/Plan    PT Assessment Patient needs continued PT services  PT Problem List Decreased strength;Decreased cognition;Decreased knowledge of use of DME;Obesity;Decreased safety awareness;Decreased activity tolerance;Decreased balance;Decreased mobility;Cardiopulmonary status limiting activity       PT Treatment Interventions DME instruction;Balance training;Gait training;Neuromuscular re-education;Stair training;Functional mobility training;Patient/family education;Therapeutic activities;Therapeutic exercise    PT Goals (Current goals can be found in the Care Plan section)  Acute Rehab PT Goals Patient Stated Goal: less dizzy PT Goal Formulation: With patient Time For Goal Achievement: 01/09/20 Potential to Achieve Goals: Fair    Frequency Min 3X/week   Barriers to discharge        Co-evaluation               AM-PAC PT "6 Clicks" Mobility  Outcome Measure Help needed turning from your back to your side while in a flat bed without using bedrails?: A Little Help needed moving from lying on your back to sitting on the side of a flat bed without using bedrails?: A Little Help needed moving to and from a bed to a chair (including a wheelchair)?: A  Little Help needed standing up from a chair using your arms (e.g., wheelchair or bedside chair)?: A Little Help needed to walk in hospital room?: A Little Help needed climbing 3-5 steps with a railing? : A Lot 6 Click Score: 17    End of Session   Activity Tolerance: Patient tolerated treatment well Patient left: in bed;with call bell/phone within reach (on ED stretcher) Nurse Communication: Mobility status PT Visit Diagnosis: Unsteadiness on feet (R26.81);Difficulty in walking, not elsewhere classified (R26.2);Muscle weakness (generalized) (M62.81);Dizziness and giddiness (R42)    Time: 1435-1500 PT Time Calculation (min) (ACUTE ONLY): 25 min   Charges:   PT Evaluation $PT Eval Moderate Complexity: 1 Mod PT Treatments $Neuromuscular Re-education: 8-22 mins       Windell Norfolk, DPT, PN1   Supplemental Physical Therapist [place]    Pager [phone] Acute Rehab Office [phone]

## 2019-12-26 NOTE — ED Notes (Signed)
Pt states improved head pain but continues to feel dizzy and short of breath. Pt sitting on side of bed, does not appear in distress, respirations are even and non-labored Skin is warm,dry and intact  Pt is A&Ox4

## 2019-12-26 NOTE — ED Notes (Signed)
Pt to floor.

## 2019-12-26 NOTE — ED Notes (Signed)
PT at bedside.

## 2019-12-26 NOTE — ED Notes (Signed)
Dr. Lorin Mercy paged to [phone] Malachi Bonds, RN paged by Levada Dy

## 2019-12-26 NOTE — ED Provider Notes (Signed)
Patient reports he has continued dizziness.  Patient reports that he now feels like things are spinning, where he originally told me he felt lightheaded.  Patient is a poor historian.  Patient is also a fall risk.  Patient will be admitted for further evaluation and management.  He may benefit from MRI to definitively rule out stroke.  However due to unsteadiness, he would need to be admitted for monitoring and likely physical therapy.  Patient may also have superimposed infiltrate vs. mass on CT scan, will start IV antibiotics.  Discussed with Dr. Marlowe Sax for admission   Ripley Fraise, MD 12/26/19 [phone]

## 2019-12-27 DIAGNOSIS — I951 Orthostatic hypotension: Secondary | ICD-10-CM | POA: Diagnosis present

## 2019-12-27 DIAGNOSIS — J189 Pneumonia, unspecified organism: Secondary | ICD-10-CM | POA: Diagnosis present

## 2019-12-27 DIAGNOSIS — Z20822 Contact with and (suspected) exposure to covid-19: Secondary | ICD-10-CM | POA: Diagnosis present

## 2019-12-27 DIAGNOSIS — H538 Other visual disturbances: Secondary | ICD-10-CM | POA: Diagnosis present

## 2019-12-27 DIAGNOSIS — Z79899 Other long term (current) drug therapy: Secondary | ICD-10-CM | POA: Diagnosis not present

## 2019-12-27 DIAGNOSIS — I10 Essential (primary) hypertension: Secondary | ICD-10-CM | POA: Diagnosis not present

## 2019-12-27 DIAGNOSIS — H209 Unspecified iridocyclitis: Secondary | ICD-10-CM | POA: Diagnosis present

## 2019-12-27 DIAGNOSIS — J440 Chronic obstructive pulmonary disease with acute lower respiratory infection: Secondary | ICD-10-CM | POA: Diagnosis present

## 2019-12-27 DIAGNOSIS — Z7982 Long term (current) use of aspirin: Secondary | ICD-10-CM | POA: Diagnosis not present

## 2019-12-27 DIAGNOSIS — Z6841 Body Mass Index (BMI) 40.0 and over, adult: Secondary | ICD-10-CM | POA: Diagnosis not present

## 2019-12-27 DIAGNOSIS — I129 Hypertensive chronic kidney disease with stage 1 through stage 4 chronic kidney disease, or unspecified chronic kidney disease: Secondary | ICD-10-CM | POA: Diagnosis present

## 2019-12-27 DIAGNOSIS — Z85828 Personal history of other malignant neoplasm of skin: Secondary | ICD-10-CM | POA: Diagnosis not present

## 2019-12-27 DIAGNOSIS — F32A Depression, unspecified: Secondary | ICD-10-CM | POA: Diagnosis present

## 2019-12-27 DIAGNOSIS — H8112 Benign paroxysmal vertigo, left ear: Secondary | ICD-10-CM | POA: Diagnosis present

## 2019-12-27 DIAGNOSIS — N1831 Chronic kidney disease, stage 3a: Secondary | ICD-10-CM | POA: Diagnosis present

## 2019-12-27 DIAGNOSIS — G8929 Other chronic pain: Secondary | ICD-10-CM | POA: Diagnosis present

## 2019-12-27 DIAGNOSIS — R682 Dry mouth, unspecified: Secondary | ICD-10-CM | POA: Diagnosis present

## 2019-12-27 DIAGNOSIS — F17210 Nicotine dependence, cigarettes, uncomplicated: Secondary | ICD-10-CM | POA: Diagnosis present

## 2019-12-27 DIAGNOSIS — R42 Dizziness and giddiness: Secondary | ICD-10-CM | POA: Diagnosis present

## 2019-12-27 DIAGNOSIS — Z888 Allergy status to other drugs, medicaments and biological substances status: Secondary | ICD-10-CM | POA: Diagnosis not present

## 2019-12-27 DIAGNOSIS — R0902 Hypoxemia: Secondary | ICD-10-CM | POA: Diagnosis present

## 2019-12-27 DIAGNOSIS — I252 Old myocardial infarction: Secondary | ICD-10-CM | POA: Diagnosis not present

## 2019-12-27 DIAGNOSIS — F419 Anxiety disorder, unspecified: Secondary | ICD-10-CM | POA: Diagnosis present

## 2019-12-27 DIAGNOSIS — J431 Panlobular emphysema: Secondary | ICD-10-CM | POA: Diagnosis not present

## 2019-12-27 DIAGNOSIS — E1122 Type 2 diabetes mellitus with diabetic chronic kidney disease: Secondary | ICD-10-CM | POA: Diagnosis present

## 2019-12-27 DIAGNOSIS — G4733 Obstructive sleep apnea (adult) (pediatric): Secondary | ICD-10-CM | POA: Diagnosis present

## 2019-12-27 LAB — GLUCOSE, CAPILLARY
Glucose-Capillary: 139 mg/dL — ABNORMAL HIGH (ref 70–99)
Glucose-Capillary: 186 mg/dL — ABNORMAL HIGH (ref 70–99)
Glucose-Capillary: 93 mg/dL (ref 70–99)
Glucose-Capillary: 132 mg/dL — ABNORMAL HIGH (ref 70–99)

## 2019-12-27 LAB — LIPID PANEL
Cholesterol: 187 mg/dL (ref 0–200)
HDL: 34 mg/dL — ABNORMAL LOW (ref >40)
LDL Cholesterol: 94 mg/dL (ref 0–99)
Total CHOL/HDL Ratio: 5.5 RATIO
Triglycerides: 297 mg/dL — ABNORMAL HIGH (ref <150)
VLDL: 59 mg/dL — ABNORMAL HIGH (ref 0–40)

## 2019-12-27 MED ORDER — OXCARBAZEPINE 300 MG PO TABS
300.0000 mg | ORAL_TABLET | Freq: Two times a day (BID) | ORAL | Status: DC
  Administered 2019-12-27 – 2019-12-28 (×3): 300 mg via ORAL
  Filled 2019-12-27 (×3): qty 1

## 2019-12-27 NOTE — Evaluation (Addendum)
Speech Language Pathology Evaluation Patient Details Name: Mario [name] MRN: [id number] DOB: Apr 30, 1950 Today's Date: 12/27/2019 Time: 2458-0998 SLP Time Calculation (min) (ACUTE ONLY): 18 min  Problem List:  Patient Active Problem List   Diagnosis Date Noted  . Vertigo 12/26/2019  . 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 Stage 3a chronic kidney disease ([place]) 12/26/2019  . Abnormal CT of the chest 12/26/2019  . COPD (chronic obstructive pulmonary disease) ([place])   . AKI (acute kidney injury) ([place])   . Hyperglycemia 06/23/2019  . OSA (obstructive sleep apnea) 06/23/2019  . Suicidal ideations 06/23/2019  . Major depressive disorder, recurrent episode ([place]) 10/12/2018  . 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 Cellulitis of both feet 07/16/2017  . DM2 (diabetes mellitus, type 2) ([place]) 07/16/2017  . HLA B27 (HLA B27 positive) 07/16/2017  . Acute chest pain   . Hyperkalemia   . Spontaneous pneumothorax 06/27/2016  . Hypoxia   . Pneumothorax on left   . Dyspnea 05/23/2014  . COPD exacerbation ([place]) 05/23/2014  . Malingering 01/21/2013  . Depression 01/11/2013  . Tobacco abuse 01/11/2013  . Obesity, unspecified 01/11/2013  . Chest pain 01/10/2013  . HTN (hypertension) 01/10/2013   Past Medical History:  Past Medical History:  Diagnosis Date  . Anxiety   . Bronchitis   . COPD (chronic obstructive pulmonary disease) ([place])   . Depression   . Hypertension   . Mental disorder   . MI (myocardial infarction) ([place])    ????  . OSA (obstructive sleep apnea)   . Suicide attempt ([place])   . Tension pneumothorax 06/27/2016  . Uveitis    Past Surgical History:  Past Surgical History:  Procedure Laterality Date  . CHEST TUBE INSERTION Left 06/27/2016  . cryptorchidism    . SKIN CANCER EXCISION     HPI:  Patient with history of COPD presented on 12/25/19 with complaints of dizziness/vertigo-like symptoms and shortness of breath.  Head CT  negative.  CT angiogram negative for PE but showing worsening emphysema and superimposed infiltrate.  He has been started on antibiotics for coverage of pneumonia.  Continues to have vertigo symptoms and cannot go home.  MRI on 12/26/19 indicated No acute intracranial abnormality. Specifically, no acute infarct.  Assessment / Plan / Recommendation Clinical Impression  Pt was assessed via SLUMS ([place] Mental Status Examination) with a score of 19/30 obtained with a typical score being 27/30.  Deficits included: delayed recall of objects with 2/5 able to be recalled with 3/5 obtained given categorization cue, 75% accuracy with auditory comprehension task, simple calculation task with 50% accuracy despite repetition of task.  Word fluency task [facility] and visual deficits (blurriness) noted with clock formation and glasses were not available, so this may have impacted formulation of clock, although he only marked clock vs writing numbers on it, so directives may have been difficult to carry out as well as visual deficits impacting performance.  Pt oriented x4 and very pleasant during SLE, but flat affect apparent.  Pt with decreased awareness for current deficits. Recommend ST f/u for on-going cognitive assessment and cognitive deficits.  Thank you for this consult.    SLP Assessment  SLP Recommendation/Assessment: Patient needs continued Speech Language Pathology Services SLP Visit Diagnosis: Cognitive communication deficit (R41.841)    Follow Up Recommendations  Other (comment) (TBD)    Frequency and Duration min 2x/week  1 week      SLP Evaluation Cognition  Overall Cognitive Status: No family/caregiver  present to determine baseline cognitive functioning Arousal/Alertness: Awake/alert Orientation Level: Oriented X4 Attention: Sustained Sustained Attention: Impaired Sustained Attention Impairment: Verbal basic;Functional basic (could be related to pain) Memory: Impaired Memory  Impairment: Retrieval deficit;Decreased recall of new information;Decreased short term memory Decreased Short Term Memory: Verbal basic;Functional basic Immediate Memory Recall: Sock;Blue;Bed Memory Recall Sock: Without Cue Memory Recall Blue: With Cue Memory Recall Bed: Not able to recall Problem Solving: Impaired Problem Solving Impairment: Verbal basic;Functional basic (Stated he would be able to function at home as before if d/c) Behaviors: Perseveration Safety/Judgment: Impaired Comments:  (stated he would be able to function as before if d/c)       Comprehension  Auditory Comprehension Overall Auditory Comprehension: Impaired Yes/No Questions: Within Functional Limits Conversation: Simple Interfering Components: Pain;Working memory EffectiveTechniques: Repetition Retail banker: Not tested Reading Comprehension Reading Status: Unable to assess (comment) (visual deficits)    Expression Expression Primary Mode of Expression: Verbal Verbal Expression Overall Verbal Expression: Appears within functional limits for tasks assessed Level of Generative/Spontaneous Verbalization: Conversation Repetition: No impairment Naming: No impairment Pragmatics: Impairment Impairments: Abnormal affect Non-Verbal Means of Communication: Not applicable Written Expression Dominant Hand: Right Written Expression: Unable to assess (comment) (visual deficits)   Oral / Motor  Oral Motor/Sensory Function Overall Oral Motor/Sensory Function: Within functional limits Motor Speech Overall Motor Speech: Appears within functional limits for tasks assessed Respiration: Within functional limits Phonation: Normal Resonance: Within functional limits Articulation: Within functional limitis Intelligibility: Intelligible Motor Planning: Witnin functional limits Motor Speech Errors: Not applicable                      Elvina Sidle, M.S., CCC-SLP 12/27/2019, 1:26  PM

## 2019-12-27 NOTE — Progress Notes (Signed)
Physical Therapy Treatment Patient Details Name: Mario [name] MRN: [id number] DOB: 06-Dec-1950 Today's Date: 12/27/2019    History of Present Illness 69yo male c/o feeling lightheaded and SOB, referred to ED by [facility]. PE negative. Developed dizziness while in the ED, MRA/MRI negative. PMH anxiety, HTN, mental disorder, MI, hx suicide attempt, MDD, malingering, obesity    PT Comments    Pt admitted with above diagnosis. Pt was able to tolerate some vestibular testing. Pt appeared positive for left BPPV and treated.  However also pt positive for orthostatic BPs.  See below.  Did not walk pt due to pt with dizziness and ears ringing with standing with BP dropping.  Notified nursing.  Will continue as pt tolerates.  Pt currently with functional limitations due to balance and endurance deficits. Pt will benefit from skilled PT to increase their independence and safety with mobility to allow discharge to the venue listed below.    Orthostatic BPs  Supine 127/79, 90 bpm  Sitting 143/77, 94 bpm  Standing 124/71, 102 bpm  Standing after 3 min 108/94, 101 bpm    Pt dizziness at rest on arrival sitting EOB was 0/10 per pt.  During treatment 4/10.  After standing dizziness incr to 7/10.    Follow Up Recommendations  Other (comment) (HHPT vs OP PT)     Equipment Recommendations  None recommended by PT    Recommendations for Other Services       Precautions / Restrictions Precautions Precautions: Fall;Other (comment) Precaution Comments: watch vitals Restrictions Weight Bearing Restrictions: No    Mobility  Bed Mobility Overal bed mobility: Needs Assistance Bed Mobility: Supine to Sit;Sit to Supine     Supine to sit: Min guard Sit to supine: Min guard   General bed mobility comments: Sitting EOB on arrival.  Did some bed mobitity with testing. Min guard overall for bed mobility as pt lightheaded  Transfers Overall transfer level: Needs assistance Equipment used: Rolling walker (2  wheeled) Transfers: Sit to/from Stand Sit to Stand: Min guard         General transfer comment: Cues for hand placement.  STeadying assist given as pt was lightheaded and reported ears ringing. Orthostatic BPs taken.   Ambulation/Gait             General Gait Details: Deferred due to orthostatic BP   Stairs             Wheelchair Mobility    Modified Rankin (Stroke Patients Only)       Balance Overall balance assessment: Needs assistance Sitting-balance support: Feet unsupported;No upper extremity supported Sitting balance-Leahy Scale: Fair Sitting balance - Comments: Can sit EOB without assist   Standing balance support: Bilateral upper extremity supported;During functional activity Standing balance-Leahy Scale: Poor Standing balance comment: relies on UE support for balance.                             Cognition Arousal/Alertness: Awake/alert Behavior During Therapy: Flat affect;Impulsive Overall Cognitive Status: No family/caregiver present to determine baseline cognitive functioning Area of Impairment: Attention;Memory;Following commands;Safety/judgement;Awareness;Problem solving                   Current Attention Level: Sustained Memory: Decreased short-term memory;Decreased recall of precautions Following Commands: Follows one step commands consistently;Follows one step commands with increased time Safety/Judgement: Decreased awareness of safety Awareness: Intellectual Problem Solving: Slow processing;Difficulty sequencing;Requires verbal cues General Comments: slow processing and needed repetitive cues to follow commands  today; often tells me "I don't know" or was very unclear with his answers in general      Exercises      General Comments General comments (skin integrity, edema, etc.): Pt tested for BPPV and appeared to have positive test for left BPPV.  Treated with canalith repositioning and pt states that his spinning was  resolved after treatment.        Pertinent Vitals/Pain Pain Assessment: No/denies pain    Home Living                      Prior Function            PT Goals (current goals can now be found in the care plan section) Acute Rehab PT Goals Patient Stated Goal: less dizzy Progress towards PT goals: Progressing toward goals    Frequency    Min 3X/week      PT Plan Current plan remains appropriate    Co-evaluation              AM-PAC PT "6 Clicks" Mobility   Outcome Measure  Help needed turning from your back to your side while in a flat bed without using bedrails?: A Little Help needed moving from lying on your back to sitting on the side of a flat bed without using bedrails?: A Little Help needed moving to and from a bed to a chair (including a wheelchair)?: A Little Help needed standing up from a chair using your arms (e.g., wheelchair or bedside chair)?: A Little Help needed to walk in hospital room?: A Little Help needed climbing 3-5 steps with a railing? : A Lot 6 Click Score: 17    End of Session Equipment Utilized During Treatment: Gait belt Activity Tolerance: Patient limited by fatigue (limited by dizziness) Patient left: in bed;with call bell/phone within reach (sitting EOB to finish breakfast) Nurse Communication: Mobility status (Nurse aware of pt orthostasis) PT Visit Diagnosis: Unsteadiness on feet (R26.81);Difficulty in walking, not elsewhere classified (R26.2);Muscle weakness (generalized) (M62.81);Dizziness and giddiness (R42)     Time: 0929-5747 PT Time Calculation (min) (ACUTE ONLY): 27 min  Charges:  $Therapeutic Activity: 8-22 mins $Canalith Rep Proc: 8-22 mins                     Carlethia Mesquita W,PT Acute Rehabilitation Services Pager:  [phone]  Office:  [place] 12/27/2019, 10:16 AM

## 2019-12-27 NOTE — TOC Initial Note (Signed)
Transition of Care [facility]) - Initial/Assessment Note    Patient Details  Name: Mario [name] MRN: [id number] Date of Birth: Jan 27, 1951  [facility] CM/SW Contact:    Pollie Friar, RN Phone Number: 12/27/2019, 4:06 PM  Clinical Narrative:                 Pt lives with roommate. Pt uses [place] transportation for appts. He prefers [place] over outpatient d/t transportation limitations. CM provided choice and [facility] decided on. Amy with [facility] accepted the referral. Pt denies issues with home medications. TOC following for further d/c needs.   Expected Discharge Plan: [place] Barriers to Discharge: Continued Medical Work up   Patient Goals and CMS Choice   CMS Medicare.gov Compare Post Acute Care list provided to:: Patient Choice offered to / list presented to : Patient  Expected Discharge Plan and Services Expected Discharge Plan: [place]   Discharge Planning Services: CM Consult Post Acute Care Choice: [place] arrangements for the past 2 months: Apartment                           HH Arranged: PT, OT [place]: [facility] Date [place]: 12/27/19   Representative spoke with at [place]: Amy  Prior Living Arrangements/Services Living arrangements for the past 2 months: [place] with:: Roommate Patient language and need for interpreter reviewed:: Yes Do you feel safe going back to the place where you live?: Yes      Need for Family Participation in Patient Care: Yes (Comment) Care giver support system in place?: No (comment) Current home services: DME (shower seat/ CPAP/ oxygen/ cane/ walker) Criminal Activity/Legal Involvement Pertinent to Current Situation/Hospitalization: No - Comment as needed  Activities of Daily Living Home Assistive Devices/Equipment: CPAP, Walker (specify type), Cane (specify quad or straight) ADL Screening (condition at time of  admission) Patient's cognitive ability adequate to safely complete daily activities?: Yes Is the patient deaf or have difficulty hearing?: No Does the patient have difficulty seeing, even when wearing glasses/contacts?: No Does the patient have difficulty concentrating, remembering, or making decisions?: No Patient able to express need for assistance with ADLs?: No Does the patient have difficulty dressing or bathing?: No Independently performs ADLs?: Yes (appropriate for developmental age) Does the patient have difficulty walking or climbing stairs?: No Weakness of Legs: None Weakness of Arms/Hands: None  Permission Sought/Granted                  Emotional Assessment Appearance:: Appears stated age Attitude/Demeanor/Rapport: Engaged Affect (typically observed): Accepting Orientation: : Oriented to Self, Oriented to Place, Oriented to  Time, Oriented to Situation   Psych Involvement: No (comment)  Admission diagnosis:  Dizziness [R42] Vertigo [R42] Lung mass [R91.8] Hypoxia [R09.02] Community acquired pneumonia of left lower lobe of lung [J18.9] Patient Active Problem List   Diagnosis Date Noted  . Vertigo 12/26/2019  . 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 Stage 3a chronic kidney disease ([place]) 12/26/2019  . Abnormal CT of the chest 12/26/2019  . COPD (chronic obstructive pulmonary disease) ([place])   . AKI (acute kidney injury) ([place])   . Hyperglycemia 06/23/2019  . OSA (obstructive sleep apnea) 06/23/2019  . Suicidal ideations 06/23/2019  . Major depressive disorder, recurrent episode ([place]) 10/12/2018  . 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 Cellulitis of both feet 07/16/2017  . DM2 (  diabetes mellitus, type 2) ([place]) 07/16/2017  . HLA B27 (HLA B27 positive) 07/16/2017  . Acute chest pain   . Hyperkalemia   . Spontaneous pneumothorax 06/27/2016  . Hypoxia   . Pneumothorax on left   . Dyspnea 05/23/2014  . COPD exacerbation  ([place]) 05/23/2014  . Malingering 01/21/2013  . Depression 01/11/2013  . Tobacco abuse 01/11/2013  . Obesity, unspecified 01/11/2013  . Chest pain 01/10/2013  . HTN (hypertension) 01/10/2013   PCP:  Clinic, [place]:   [place], [place] [phone] [place] [address] Phone: [phone] Fax: [phone]     Social Determinants of Health (SDOH) Interventions    Readmission Risk Interventions No flowsheet data found.

## 2019-12-27 NOTE — Evaluation (Signed)
Occupational Therapy Evaluation Patient Details Name: Mario [name] MRN: [id number] DOB: 03-Dec-1950 Today's Date: 12/27/2019    History of Present Illness 69yo male c/o feeling lightheaded and SOB, referred to ED by [facility]. PE negative. Developed dizziness while in the ED, MRA/MRI negative. PMH anxiety, HTN, mental disorder, MI, hx suicide attempt, MDD, malingering, obesity   Clinical Impression   PT admitted with dizziness. Pt currently with functional limitiations due to the deficits listed below (see OT problem list). Pt reports blurred vision but then reading menu to place lunch order when handed menu. Pt states "I can't read the number but I can read the choices." the print is same size font only one is more bold print. Pt completes basic transfer with RW min guard (A). Pt reports calling [facility] for any doctor appointments and limited to no support from roommate.  Pt will benefit from skilled OT to increase their independence and safety with adls and balance to allow discharge [place].     Follow Up Recommendations  Home health OT    Equipment Recommendations  Other (comment) (RW)    Recommendations for Other Services       Precautions / Restrictions Precautions Precautions: Fall Precaution Comments: watch vitals      Mobility Bed Mobility Overal bed mobility: Needs Assistance Bed Mobility: Supine to Sit;Sit to Supine     Supine to sit: Min guard Sit to supine: Min guard        Transfers Overall transfer level: Needs assistance Equipment used: Rolling walker (2 wheeled) Transfers: Sit to/from Stand Sit to Stand: Min guard         General transfer comment: use of bil uE ot push from bed surface    Balance Overall balance assessment: Needs assistance Sitting-balance support: Bilateral upper extremity supported;Feet supported Sitting balance-Leahy Scale: Fair     Standing balance support: Single extremity supported;During functional  activity Standing balance-Leahy Scale: Fair                             ADL either performed or assessed with clinical judgement   ADL Overall ADL's : Needs assistance/impaired Eating/Feeding: Modified independent Eating/Feeding Details (indicate cue type and reason): drinking from straw at bedside Grooming: Wash/dry hands;Supervision/safety Grooming Details (indicate cue type and reason): cues for safety with RW             Lower Body Dressing: Maximal assistance   Toilet Transfer: Min Psychiatric nurse Details (indicate cue type and reason): cues for safety with RW         Functional mobility during ADLs: Rolling walker;Min guard       Vision Baseline Vision/History: No visual deficits Patient Visual Report: Blurring of vision       Perception     Praxis      Pertinent Vitals/Pain Pain Assessment: 0-10 Pain Score: 7  Pain Location: head/neck Pain Descriptors / Indicators: Constant;Discomfort;Grimacing;Headache Pain Intervention(s): Premedicated before session;Repositioned;Heat applied     Hand Dominance Right   Extremity/Trunk Assessment Upper Extremity Assessment Upper Extremity Assessment: Generalized weakness   Lower Extremity Assessment Lower Extremity Assessment: Defer to PT evaluation   Cervical / Trunk Assessment Cervical / Trunk Assessment: Kyphotic   Communication Communication Communication: No difficulties   Cognition Arousal/Alertness: Awake/alert Behavior During Therapy: Flat affect;Impulsive Overall Cognitive Status: No family/caregiver present to determine baseline cognitive functioning  General Comments: decrease awareness to fall risk and getting up without (A)    General Comments  pt without any c/o dizziness able to complete transfer to and from bathroom. pt does report some neck stiffness    Exercises     Shoulder Instructions      Home Living Family/patient  expects to be discharged to:: Private residence Living Arrangements: Non-relatives/Friends Available Help at Discharge: Friend(s);Available PRN/intermittently Type of Home: Apartment Home Access: Stairs to enter [employer] of Steps: 4-5 ("they are nasty ones, they are out of code") Entrance Stairs-Rails: Right Home Layout: One level     Bathroom Shower/Tub: Teacher, early years/pre: Standard     Home Equipment: Environmental consultant - 2 wheels;Kasandra Knudsen - single point      Lives With: Other (Comment) (Roommate)    Prior Functioning/Environment Level of Independence: Independent with assistive device(s)        Comments: pt utilizies cane outside of apartment        OT Problem List: Decreased activity tolerance;Decreased strength;Decreased range of motion;Impaired balance (sitting and/or standing);Impaired vision/perception;Decreased safety awareness;Decreased knowledge of use of DME or AE;Decreased knowledge of precautions;Obesity;Pain      OT Treatment/Interventions: Self-care/ADL training;Therapeutic exercise;DME and/or AE instruction;Therapeutic activities;Patient/family education;Balance training;Manual therapy    OT Goals(Current goals can be found in the care plan section) Acute Rehab OT Goals Patient Stated Goal: to order lunch OT Goal Formulation: Patient unable to participate in goal setting Time For Goal Achievement: 01/10/20 Potential to Achieve Goals: Good  OT Frequency: Min 2X/week   Barriers to D/C: Decreased caregiver support          Co-evaluation              AM-PAC OT "6 Clicks" Daily Activity     Outcome Measure Help from another person eating meals?: None Help from another person taking care of personal grooming?: None Help from another person toileting, which includes using toliet, bedpan, or urinal?: A Little Help from another person bathing (including washing, rinsing, drying)?: A Little Help from another person to put on and taking  off regular upper body clothing?: None Help from another person to put on and taking off regular lower body clothing?: A Little 6 Click Score: 21   End of Session Equipment Utilized During Treatment: Rolling walker Nurse Communication: Mobility status;Precautions  Activity Tolerance: Patient tolerated treatment well Patient left: in bed;with call bell/phone within reach;with bed alarm set  OT Visit Diagnosis: Unsteadiness on feet (R26.81);Muscle weakness (generalized) (M62.81);Pain                Time: 8003-4917 OT Time Calculation (min): 23 min Charges:  OT General Charges $OT Visit: 1 Visit OT Evaluation $OT Eval Moderate Complexity: 1 Mod   Brynn, OTR/L  Acute Rehabilitation Services Pager: [phone] Office: [phone] .   Jeri Modena 12/27/2019, 3:30 PM

## 2019-12-27 NOTE — Progress Notes (Signed)
PROGRESS NOTE  Mario [name]  DOB: 1951-01-18  PCP: [facility] UVO:[id number]  DOA: 12/25/2019  LOS: 0 days   Chief Complaint  Patient presents with  . Dizziness    Brief narrative: Mario [name] is a 69 y.o. male with medical history significant of OSA; CAD; depression with h/o suicidality; HTN; and COPD Patient presented to the ED on 10/28 with 2 to 3 days history of dizziness, lightheadedness and headache for 24 hours prior to presentation. Reports dizziness was mostly from standing to sitting.  Denies any nausea, vomiting, diarrhea.  No fever.  In the ED, CT head negative.  CT angio of chest negative for PE but showed worsening emphysema and superimposed infiltrate. Patient was admitted to hospital service for further evaluation management.   Subjective: Patient was seen and examined this morning. Elderly obese Caucasian male. Complains of a headache this morning.  Assessment/Plan: Acute onset dizziness -Progressively worsening for 2 to 3 days prior to presentation. -CT head negative.  MRI/MRA brain negative. -Physical therapy evaluated him for vestibular testing.  Apparently he was positive for left BPPV and was treated but he was also positive for orthostatic blood pressure drop from supine 127/792 standing 108/94 in 3 minutes. -Continue normal saline at 50 mill per hour for now. -Continue to monitor in telemetry. -Home with PT versus outpatient PT at discharge.  Abnormal chest CT -Did not have respiratory symptoms on admission.   -CT was obtained for some unclear reason which showed moderately advanced emphysema with a thick-walled cavitary process in the LLL that is increased in size; short-term f/u with repeat CT/PET is recommended -Dr. Valeta Harms from pulmonology was notified by admitting physician.  Recommended short-interval f/u in the pulmonology clinic  -Patient did not require antibiotic treatment.  History of essential hypertension -On Cardizem and  lisinopril at home. -Continue Cardizem.  Keep lisinopril on hold.  Type 2 diabetes mellitus -A1c 7.5 on May 2021. -Metformin on hold. -Continue sliding scale insulin with Accu-Cheks. 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  COPD -Advanced COPD based on CT -Continue Humibid -Incruse Ellipta substituted for Stiolto due to formulary  Morbid obesity - There is no height or weight on file to calculate BMI. Patient has been advised to make an attempt to improve diet and exercise patterns to aid in weight loss.  Chronic pain -Continue Flexeril, Cymbalta, Neurontin, Ultram -Hold Naprosyn for now  Mobility: PT eval obtained Code Status:   Code Status: Full Code  Nutritional status: There is no height or weight on file to calculate BMI.     Diet Order            Diet heart healthy/carb modified Room service appropriate? Yes; Fluid consistency: Thin  Diet effective ____                 DVT prophylaxis: enoxaparin (LOVENOX) injection 40 mg Start: 12/26/19 1045   Antimicrobials:  None Fluid: Normal saline at 50 mill per hour Consultants: None Family Communication:  None at bedside  Status is: Observation  The patient will require care spanning > 2 midnights and should be moved to inpatient because: Patient is persistently dizzy and has orthostatic  hypotension.  Needs IV fluid  Dispo: The patient is from: Home              Anticipated d/c is to: Home              Anticipated d/c date is: 1 to 2 days              Patient currently is not medically stable to d/c.       Infusions:  . sodium chloride 50 mL/hr at 12/26/19 1651    Scheduled Meds: . aspirin  300 mg Rectal Daily   Or  . aspirin  325 mg Oral Daily  . buPROPion  300 mg Oral Daily  . busPIRone  15 mg Oral Daily  . cyclobenzaprine  10 mg Oral QHS  . cyclopentolate  1 drop Left Eye Q8H  . diltiazem  180 mg Oral Daily  . DULoxetine  60 mg Oral BID WC  . enoxaparin (LOVENOX) injection  40 mg Subcutaneous Daily  . fluticasone  1 spray Each Nare Daily  . folic acid  1 mg Oral Daily  . gabapentin  900 mg Oral QHS  . influenza vaccine adjuvanted  0.5 mL Intramuscular Tomorrow-1000  . insulin aspart  0-15 Units Subcutaneous TID WC  . lurasidone  40 mg Oral QPC supper  . melatonin  6 mg Oral QHS  . mycophenolate  1,500 mg Oral BID  . polyvinyl alcohol  1 drop Both Eyes Q8H  . prednisoLONE acetate  1 drop Both Eyes BID  . tamsulosin  0.4 mg Oral Daily  . traMADol  50 mg Oral BID  . traZODone  75 mg Oral QHS  . umeclidinium bromide  1 puff Inhalation Daily    Antimicrobials: Anti-infectives (From admission, onward)   Start     Dose/Rate Route Frequency Ordered Stop   12/26/19 0415  cefTRIAXone (ROCEPHIN) 1 g in sodium chloride 0.9 % 100 mL IVPB        1 g 200 mL/hr over 30 Minutes Intravenous  Once 12/26/19 0414 12/26/19 0545   12/26/19 0415  azithromycin (ZITHROMAX) 500 mg in sodium chloride 0.9 % 250 mL IVPB        500 mg 250 mL/hr over 60 Minutes Intravenous  Once 12/26/19 0414 12/26/19 0715      PRN meds: acetaminophen **OR** acetaminophen (TYLENOL) oral liquid 160 mg/5 mL **OR** acetaminophen, morphine injection, senna-docusate   Objective: Vitals:   12/27/19 0815 12/27/19 1135  BP:  118/60  Pulse:  100  Resp:  20  Temp:  97.8 F (36.6 C)  SpO2:  98% 93%    Intake/Output Summary (Last 24 hours) at 12/27/2019 1232 Last data filed at 12/27/2019 0052 Gross per 24 hour  Intake --  Output 900 ml  Net -900 ml   There were no vitals filed for this visit. Weight change:  There is no height or weight on file to calculate BMI.   Physical Exam: General exam: Appears calm and comfortable.  Morbidly obese, not in distress Skin: No rashes, lesions or ulcers. HEENT: Atraumatic, normocephalic, supple neck, no obvious bleeding Lungs: Clear to auscultation bilaterally CVS: Regular rate and rhythm, no murmur GI/Abd soft, nontender, nondistended, bowel sound present CNS: Alert, awake,  oriented x3 Psychiatry: Mood appropriate Extremities: No pedal edema, no calf tenderness  Data Review: I have personally reviewed the laboratory data and studies available.  Recent Labs  Lab 12/25/19 1856  WBC 14.6*  HGB 15.6  HCT 47.4  MCV 98.5  PLT 315   Recent Labs  Lab 12/25/19 1856  NA 134*  K 4.6  CL 99  CO2 20*  GLUCOSE 165*  BUN 21  CREATININE 1.38*  CALCIUM 9.1    F/u labs ordered  Signed, Terrilee Croak, MD [facility] 12/27/2019

## 2019-12-28 DIAGNOSIS — R42 Dizziness and giddiness: Secondary | ICD-10-CM | POA: Diagnosis not present

## 2019-12-28 DIAGNOSIS — I10 Essential (primary) hypertension: Secondary | ICD-10-CM

## 2019-12-28 DIAGNOSIS — J431 Panlobular emphysema: Secondary | ICD-10-CM | POA: Diagnosis not present

## 2019-12-28 LAB — CBC WITH DIFFERENTIAL/PLATELET
Abs Immature Granulocytes: 0.03 10*3/uL (ref 0.00–0.07)
Basophils Absolute: 0.1 10*3/uL (ref 0.0–0.1)
Basophils Relative: 1 %
Eosinophils Absolute: 0.2 10*3/uL (ref 0.0–0.5)
Eosinophils Relative: 3 %
HCT: 45.2 % (ref 39.0–52.0)
Hemoglobin: 14.8 g/dL (ref 13.0–17.0)
Immature Granulocytes: 0 %
Lymphocytes Relative: 30 %
Lymphs Abs: 2.9 10*3/uL (ref 0.7–4.0)
MCH: 32.7 pg (ref 26.0–34.0)
MCHC: 32.7 g/dL (ref 30.0–36.0)
MCV: 100 fL (ref 80.0–100.0)
Monocytes Absolute: 0.9 10*3/uL (ref 0.1–1.0)
Monocytes Relative: 10 %
Neutro Abs: 5.3 10*3/uL (ref 1.7–7.7)
Neutrophils Relative %: 56 %
Platelets: 267 10*3/uL (ref 150–400)
RBC: 4.52 MIL/uL (ref 4.22–5.81)
RDW: 13.5 % (ref 11.5–15.5)
WBC: 9.5 10*3/uL (ref 4.0–10.5)
nRBC: 0 % (ref 0.0–0.2)

## 2019-12-28 LAB — HEMOGLOBIN A1C
Hgb A1c MFr Bld: 7.8 % — ABNORMAL HIGH (ref 4.8–5.6)
Mean Plasma Glucose: 177 mg/dL

## 2019-12-28 LAB — BASIC METABOLIC PANEL
Anion gap: 9 (ref 5–15)
BUN: 15 mg/dL (ref 8–23)
CO2: 27 mmol/L (ref 22–32)
Calcium: 8.8 mg/dL — ABNORMAL LOW (ref 8.9–10.3)
Chloride: 101 mmol/L (ref 98–111)
Creatinine, Ser: 1.03 mg/dL (ref 0.61–1.24)
GFR, Estimated: >60 mL/min (ref >60)
Glucose, Bld: 140 mg/dL — ABNORMAL HIGH (ref 70–99)
Potassium: 5.1 mmol/L (ref 3.5–5.1)
Sodium: 137 mmol/L (ref 135–145)

## 2019-12-28 LAB — GLUCOSE, CAPILLARY
Glucose-Capillary: 140 mg/dL — ABNORMAL HIGH (ref 70–99)
Glucose-Capillary: 117 mg/dL — ABNORMAL HIGH (ref 70–99)

## 2019-12-28 NOTE — Discharge Summary (Signed)
Physician Discharge Summary  Patient ID: Mario [name] MRN: [id number] DOB/AGE: 07-29-1950 69 y.o.  Admit date: 12/25/2019 Discharge date: 12/28/2019  Admission Diagnoses:  Discharge Diagnoses:  Principal Problem:   Vertigo Active Problems:   HTN (hypertension)   Depression   DM2 (diabetes mellitus, type 2) (HCC)   OSA (obstructive sleep apnea)   COPD (chronic obstructive pulmonary disease) (HCC)   Uveitis of both eyes   Stage 3a chronic kidney disease (HCC)   Abnormal CT of the chest   Discharged Condition: good  Hospital Course:   Mario [name] a 69 y.o.malewith medical history significant ofOSA; CAD; depression with h/o suicidality; HTN; and COPD Patient presented to the ED on 10/28 with 2 to 3 days history of dizziness, lightheadedness and headache for 24 hours prior to presentation. Reports dizziness was mostly from standing to sitting.  Denies any nausea, vomiting, diarrhea.  No fever.  In the ED, CT head negative.  CT angio of chest negative for PE but showed worsening emphysema and superimposed infiltrate. Patient was admitted to hospital service for further evaluation management.  #1.  Acute vertigo and dizziness secondary to benign positional vertigo and orthostatic hypotension. Patient received IV fluids.  Patient was also evaluated by Occupational Therapy.  Condition had improved.  At this point, patient be referred to occupational therapy for additional treatment as outpatient.  Blood pressure has been adjusted to discontinue lisinopril.  Diltiazem was continued.  Patient has not had any syncope before or after admission to the hospital. Patient also had a MRI of the brain did not show any acute illness at time of admission.  #2.  Left lower lobe cavitary lesion. Incidental finding on CT scan, patient does not have any respiratory symptoms. Follow-up with pulmonology in 2 weeks.  3.  Essential hypertension. Continue with the diltiazem, discontinue  lisinopril due to orthostatic hypotension peer  4.  Depression with history of suicide ideation per No recurrence at this admission.  Continue current medicines per  5.  Stage IIIa chronic kidney disease Stable.  4.  Obstruct sleep apnea with chronic hypoxemic respite failure. Patient was using 2 L oxygen at nighttime with CPAP.  9.  COPD pain Continue home dose medicines per  10.  Morbid obesity and chronic pain.    Consults: None  Significant Diagnostic Studies:  MRI HEAD FINDINGS  Brain: No acute infarction, hemorrhage, hydrocephalus, extra-axial collection or mass lesion. Mild generalized volume loss with ex vacuo ventricular dilation. Scattered T2/FLAIR hyperintensities within the white matter, compatible with chronic microvascular ischemic disease.  Vascular: Major arterial flow voids are maintained at the skull base.  Skull and upper cervical spine: Normal marrow signal.  Sinuses/Orbits: Sinuses are clear.  Unremarkable orbits.  Other: No mastoid effusions. Small amount of suspected trapped fluid at the left petrous apex.  MRA HEAD FINDINGS  Anterior circulation: Limited evaluation of the ICAs proximal to the cavernous segment secondary to artifact. Within this limitation, no evidence of hemodynamically significant proximal stenosis or large vessel occlusion. No aneurysm identified.  Posterior circulation: No evidence of hemodynamically significant stenosis or large vessel occlusion. Mild irregularity of the basilar artery and bilateral posterior cerebral arteries, likely related to atherosclerosis. Flow related signal identified bilateral posteroinferior cerebral arteries.  IMPRESSION: 1. No acute intracranial abnormality. Specifically, no acute infarct. 2. No evidence of hemodynamically significant stenosis or large vessel occlusion, within limitations detailed above. 3. Generalized cerebral volume loss.   Electronically Signed   By:  Margaretha Sheffield MD   On: 12/26/2019 14:49  CT ANGIOGRAPHY CHEST WITH CONTRAST  TECHNIQUE: Multidetector CT imaging of the chest was performed using the standard protocol during bolus administration of intravenous contrast. Multiplanar CT image reconstructions and MIPs were obtained to evaluate the vascular anatomy.  CONTRAST:  132mL OMNIPAQUE IOHEXOL 350 MG/ML SOLN  COMPARISON:  Radiograph yesterday.  Chest CTA 09/17/2016  FINDINGS: Cardiovascular: There are no filling defects within the pulmonary arteries to suggest pulmonary embolus. Basilar assessment is partially obscured by breathing motion artifact. Thoracic aorta is normal in caliber. No aortic dissection. There is irregular calcified and noncalcified plaque involving the descending thoracic aorta. No evidence of vasculitis or penetrating ulcer. Upper normal heart size. No pericardial effusion. Coronary artery calcifications are seen. Again seen variant venous drainage on intercostal vein into the brachiocephalic, unchanged.  Mediastinum/Nodes: Multiple small mediastinal lymph nodes not enlarged by size criteria. No hilar adenopathy. No esophageal wall thickening. No suspicious thyroid nodule.  Lungs/Pleura: Moderately advanced emphysema. There is a thick-walled cavitary process in the periphery of the left lower lobe, series 13, image 72. This measures 3.8 cm on the current exam, increased in size from 2 cm previously. Subpleural bleb in the right lower lobe has increased in size from 2018 with adjacent chronic atelectasis/scarring. Mild chronic scarring in the lingula. Mild elevation of the right hemidiaphragm with adjacent compressive atelectasis. No pleural fluid. No pneumothorax.  Upper Abdomen: Prominent size liver with steatosis, partially included. No acute findings.  Musculoskeletal: Flowing syndesmophytes throughout the thoracic spine. There are no acute or suspicious osseous  abnormalities.  Review of the MIP images confirms the above findings.  IMPRESSION: 1. No pulmonary embolus. 2. Moderately advanced emphysema. Thick-walled cavitary process in the periphery of the left lower lobe has increased in size from 2018 CT, currently 3.8 cm, previously 2 cm. This may represent an area of chronic scarring given prior chest tube placement in this region, however increased size is worrisome for malignancy or potentially superimposed infectious process. Recommend either short-term follow-up CT after course of treatment or further characterization with PET CT. 3. Subpleural bleb in the right lower lobe has increased in size from 2018 with adjacent compressive atelectasis/scarring.  Aortic Atherosclerosis (ICD10-I70.0) and Emphysema (ICD10-J43.9).   Electronically Signed   By: Keith Rake M.D.   On: 12/26/2019 02:28  Treatments: IV hydration  Discharge Exam: Blood pressure 135/75, pulse 87, temperature 97.9 F (36.6 C), temperature source Oral, resp. rate (!) 21, SpO2 94 %. General appearance: alert and cooperative Resp: clear to auscultation bilaterally Cardio: regular rate and rhythm, S1, S2 normal, no murmur, click, rub or gallop GI: soft, non-tender; bowel sounds normal; no masses,  no organomegaly Extremities: extremities normal, atraumatic, no cyanosis or edema  Disposition: Discharge disposition: 01-Home or Self Care       Discharge Instructions    Ambulatory referral to Occupational Therapy   Complete by: As directed    Vertigo   Diet - low sodium heart healthy   Complete by: As directed    Increase activity slowly   Complete by: As directed      Allergies as of 12/28/2019      Reactions   Demerol [meperidine] Nausea And Vomiting, Other (See Comments)   Made the patient "violently sick"   Zocor [simvastatin] Nausea And Vomiting, Other (See Comments)   Made him very jittery, also   Beet [beta Vulgaris] Nausea And Vomiting    Liver Nausea And Vomiting      Medication List    STOP taking these medications  lisinopril 20 MG tablet Commonly known as: ZESTRIL   naproxen 500 MG tablet Commonly known as: NAPROSYN     TAKE these medications   buPROPion 300 MG 24 hr tablet Commonly known as: WELLBUTRIN XL Take 300 mg by mouth daily.   busPIRone 15 MG tablet Commonly known as: BUSPAR Take 15 mg by mouth daily.   calcium-vitamin D 500-200 MG-UNIT tablet Commonly known as: OSCAL WITH D Take 1 tablet by mouth 2 (two) times daily with a meal.   cyclobenzaprine 10 MG tablet Commonly known as: FLEXERIL Take 10 mg by mouth at bedtime.   cyclopentolate 1 % ophthalmic solution Commonly known as: CYCLODRYL,CYCLOGYL Place 1 drop into the left eye in the morning, at noon, and at bedtime.   diltiazem 180 MG 24 hr capsule Commonly known as: CARDIZEM CD Take 180 mg by mouth daily.   docusate calcium 240 MG capsule Commonly known as: SURFAK Take 240 mg by mouth daily.   DULoxetine 60 MG capsule Commonly known as: CYMBALTA Take 1 capsule (60 mg total) by mouth 2 (two) times daily. What changed: when to take this   Enbrel 50 MG/ML injection Generic drug: etanercept Inject 50 mg into the skin once a week.   fluticasone 50 MCG/ACT nasal spray Commonly known as: FLONASE Place 2 sprays into both nostrils daily.   folic acid 1 MG tablet Commonly known as: FOLVITE Take 1 mg by mouth daily.   gabapentin 300 MG capsule Commonly known as: NEURONTIN Take 2 capsules (600 mg total) by mouth 2 (two) times daily. What changed:   how much to take  when to take this   guaifenesin 400 MG Tabs tablet Commonly known as: HUMIBID E Take 400 mg by mouth in the morning and at bedtime.   lurasidone 40 MG Tabs tablet Commonly known as: LATUDA Take 40 mg by mouth daily after supper.   melatonin 3 MG Tabs tablet Take 6 mg by mouth at bedtime.   metFORMIN 500 MG tablet Commonly known as: GLUCOPHAGE Take 500 mg  by mouth in the morning and at bedtime.   methotrexate 2.5 MG tablet Commonly known as: RHEUMATREX Take 6 tablets (15 mg total) by mouth every Saturday. Hold until seen by rheumatology  Caution:Chemotherapy. Protect from light. What changed:   when to take this  additional instructions   multivitamin with minerals Tabs tablet Take 1 tablet by mouth daily.   mycophenolate 250 MG capsule Commonly known as: CELLCEPT Take 1,500 mg by mouth 2 (two) times daily.   nitroGLYCERIN 0.4 MG SL tablet Commonly known as: NITROSTAT Place 1 tablet (0.4 mg total) under the tongue every 5 (five) minutes as needed for chest pain.   Oxcarbazepine 300 MG tablet Commonly known as: TRILEPTAL Take 300 mg by mouth 2 (two) times daily.   OXYGEN Inhale 2 L/min into the lungs See admin instructions. 2 liters/min at bedtime with CPAP, during naps and all times of exertion   polyethylene glycol 17 g packet Commonly known as: MIRALAX / GLYCOLAX Take 17 g by mouth daily.   prednisoLONE acetate 1 % ophthalmic suspension Commonly known as: PRED FORTE Place 1 drop into both eyes See admin instructions. INSTILL 1 DROP IN LEFT EYE EVERY HOUR AND FOLLOW TAPER SCHEDULE FOR PRED FORTE FOR THE RIGHT EYE   PRESCRIPTION MEDICATION CPAP- At bedtime and during during any naps   sodium chloride 0.65 % Soln nasal spray Commonly known as: OCEAN Place 2 sprays into both nostrils 4 (four) times daily as  needed for congestion.   Stiolto Respimat 2.5-2.5 MCG/ACT Aers Generic drug: Tiotropium Bromide-Olodaterol Inhale 2 puffs into the lungs daily.   Systane Balance 0.6 % Soln Generic drug: Propylene Glycol Place 1 drop into both eyes in the morning, at noon, and at bedtime.   tamsulosin 0.4 MG Caps capsule Commonly known as: FLOMAX Take 0.4 mg by mouth daily.   traMADol 50 MG tablet Commonly known as: ULTRAM Take 50 mg by mouth 2 (two) times daily.   traZODone 50 MG tablet Commonly known as: DESYREL Take  75 mg by mouth at bedtime.   vitamin C 500 MG tablet Commonly known as: ASCORBIC ACID Take 500 mg by mouth 2 (two) times daily.   Vitamin D-3 25 MCG (1000 UT) Caps Take 1,000 Units by mouth daily. IN ADDITION TO CALCIUM +D       Follow-up Information    [facility] Follow up.   Specialty: Home Health Services Why: The home health agency will contact you for the first home visit. Contact information: [place] [address] [phone]        [facility] Follow up.   Contact information: [place] [address] [phone]        Sueanne Margarita, MD .   Specialty: Cardiology Contact information: [phone] [address] [place] [address] [phone]              35 minutes  Signed: Sharen Hones 12/28/2019, 9:08 AM

## 2019-12-28 NOTE — Progress Notes (Signed)
Patient being discharged home with home health. Education and information provided to patient. IV removed. CCMD notified. All belongings with patient. Leaving unit via wheelchair.

## 2020-01-03 ENCOUNTER — Emergency Department (HOSPITAL_COMMUNITY): Payer: No Typology Code available for payment source

## 2020-01-03 ENCOUNTER — Other Ambulatory Visit: Payer: Self-pay

## 2020-01-03 ENCOUNTER — Inpatient Hospital Stay (HOSPITAL_COMMUNITY)
Admission: EM | Admit: 2020-01-03 | Discharge: 2020-01-06 | DRG: 190 | Disposition: A | Discharge status: Home / Self Care | Payer: No Typology Code available for payment source | Attending: Internal Medicine | Admitting: Internal Medicine

## 2020-01-03 ENCOUNTER — Encounter (HOSPITAL_COMMUNITY): Payer: Self-pay

## 2020-01-03 DIAGNOSIS — N1831 Chronic kidney disease, stage 3a: Secondary | ICD-10-CM | POA: Diagnosis present

## 2020-01-03 DIAGNOSIS — Z79899 Other long term (current) drug therapy: Secondary | ICD-10-CM

## 2020-01-03 DIAGNOSIS — Z713 Dietary counseling and surveillance: Secondary | ICD-10-CM

## 2020-01-03 DIAGNOSIS — Z888 Allergy status to other drugs, medicaments and biological substances status: Secondary | ICD-10-CM

## 2020-01-03 DIAGNOSIS — E1122 Type 2 diabetes mellitus with diabetic chronic kidney disease: Secondary | ICD-10-CM | POA: Diagnosis present

## 2020-01-03 DIAGNOSIS — N400 Enlarged prostate without lower urinary tract symptoms: Secondary | ICD-10-CM | POA: Diagnosis present

## 2020-01-03 DIAGNOSIS — Z6841 Body Mass Index (BMI) 40.0 and over, adult: Secondary | ICD-10-CM

## 2020-01-03 DIAGNOSIS — R0902 Hypoxemia: Secondary | ICD-10-CM

## 2020-01-03 DIAGNOSIS — H209 Unspecified iridocyclitis: Secondary | ICD-10-CM | POA: Diagnosis present

## 2020-01-03 DIAGNOSIS — Z85828 Personal history of other malignant neoplasm of skin: Secondary | ICD-10-CM

## 2020-01-03 DIAGNOSIS — Z885 Allergy status to narcotic agent status: Secondary | ICD-10-CM

## 2020-01-03 DIAGNOSIS — G4733 Obstructive sleep apnea (adult) (pediatric): Secondary | ICD-10-CM | POA: Diagnosis present

## 2020-01-03 DIAGNOSIS — I129 Hypertensive chronic kidney disease with stage 1 through stage 4 chronic kidney disease, or unspecified chronic kidney disease: Secondary | ICD-10-CM | POA: Diagnosis present

## 2020-01-03 DIAGNOSIS — F419 Anxiety disorder, unspecified: Secondary | ICD-10-CM | POA: Diagnosis present

## 2020-01-03 DIAGNOSIS — J441 Chronic obstructive pulmonary disease with (acute) exacerbation: Secondary | ICD-10-CM | POA: Diagnosis not present

## 2020-01-03 DIAGNOSIS — Z9151 Personal history of suicidal behavior: Secondary | ICD-10-CM

## 2020-01-03 DIAGNOSIS — Z79891 Long term (current) use of opiate analgesic: Secondary | ICD-10-CM

## 2020-01-03 DIAGNOSIS — R079 Chest pain, unspecified: Secondary | ICD-10-CM

## 2020-01-03 DIAGNOSIS — R0602 Shortness of breath: Secondary | ICD-10-CM | POA: Diagnosis not present

## 2020-01-03 DIAGNOSIS — I252 Old myocardial infarction: Secondary | ICD-10-CM

## 2020-01-03 DIAGNOSIS — Z7984 Long term (current) use of oral hypoglycemic drugs: Secondary | ICD-10-CM

## 2020-01-03 DIAGNOSIS — J9621 Acute and chronic respiratory failure with hypoxia: Secondary | ICD-10-CM | POA: Diagnosis present

## 2020-01-03 DIAGNOSIS — Z20822 Contact with and (suspected) exposure to covid-19: Secondary | ICD-10-CM | POA: Diagnosis present

## 2020-01-03 DIAGNOSIS — E1165 Type 2 diabetes mellitus with hyperglycemia: Secondary | ICD-10-CM | POA: Diagnosis present

## 2020-01-03 DIAGNOSIS — F32A Depression, unspecified: Secondary | ICD-10-CM | POA: Diagnosis present

## 2020-01-03 DIAGNOSIS — Z91018 Allergy to other foods: Secondary | ICD-10-CM

## 2020-01-03 DIAGNOSIS — G894 Chronic pain syndrome: Secondary | ICD-10-CM | POA: Diagnosis present

## 2020-01-03 DIAGNOSIS — Z87891 Personal history of nicotine dependence: Secondary | ICD-10-CM

## 2020-01-03 DIAGNOSIS — Z9981 Dependence on supplemental oxygen: Secondary | ICD-10-CM

## 2020-01-03 LAB — COMPREHENSIVE METABOLIC PANEL
ALT: 37 U/L (ref 0–44)
AST: 24 U/L (ref 15–41)
Albumin: 4.6 g/dL (ref 3.5–5.0)
Alkaline Phosphatase: 64 U/L (ref 38–126)
Anion gap: 12 (ref 5–15)
BUN: 21 mg/dL (ref 8–23)
CO2: 25 mmol/L (ref 22–32)
Calcium: 9.9 mg/dL (ref 8.9–10.3)
Chloride: 98 mmol/L (ref 98–111)
Creatinine, Ser: 1.2 mg/dL (ref 0.61–1.24)
GFR, Estimated: >60 mL/min (ref >60)
Glucose, Bld: 118 mg/dL — ABNORMAL HIGH (ref 70–99)
Potassium: 4.4 mmol/L (ref 3.5–5.1)
Sodium: 135 mmol/L (ref 135–145)
Total Bilirubin: 0.6 mg/dL (ref 0.3–1.2)
Total Protein: 7.8 g/dL (ref 6.5–8.1)

## 2020-01-03 LAB — CBC WITH DIFFERENTIAL/PLATELET
Abs Immature Granulocytes: 0.04 10*3/uL (ref 0.00–0.07)
Basophils Absolute: 0.1 10*3/uL (ref 0.0–0.1)
Basophils Relative: 1 %
Eosinophils Absolute: 0.2 10*3/uL (ref 0.0–0.5)
Eosinophils Relative: 2 %
HCT: 47.5 % (ref 39.0–52.0)
Hemoglobin: 15.7 g/dL (ref 13.0–17.0)
Immature Granulocytes: 0 %
Lymphocytes Relative: 30 %
Lymphs Abs: 4.4 10*3/uL — ABNORMAL HIGH (ref 0.7–4.0)
MCH: 32.4 pg (ref 26.0–34.0)
MCHC: 33.1 g/dL (ref 30.0–36.0)
MCV: 98.1 fL (ref 80.0–100.0)
Monocytes Absolute: 1.1 10*3/uL — ABNORMAL HIGH (ref 0.1–1.0)
Monocytes Relative: 8 %
Neutro Abs: 8.7 10*3/uL — ABNORMAL HIGH (ref 1.7–7.7)
Neutrophils Relative %: 59 %
Platelets: 306 10*3/uL (ref 150–400)
RBC: 4.84 MIL/uL (ref 4.22–5.81)
RDW: 13.6 % (ref 11.5–15.5)
WBC: 14.5 10*3/uL — ABNORMAL HIGH (ref 4.0–10.5)
nRBC: 0 % (ref 0.0–0.2)

## 2020-01-03 LAB — D-DIMER, QUANTITATIVE: D-Dimer, Quant: <0.27 ug/mL-FEU (ref 0.00–0.50)

## 2020-01-03 MED ORDER — ALBUTEROL SULFATE HFA 108 (90 BASE) MCG/ACT IN AERS
2.0000 | INHALATION_SPRAY | Freq: Once | RESPIRATORY_TRACT | Status: AC
  Administered 2020-01-03: 2 via RESPIRATORY_TRACT
  Filled 2020-01-03: qty 6.7

## 2020-01-03 MED ORDER — METHYLPREDNISOLONE SODIUM SUCC 125 MG IJ SOLR
125.0000 mg | Freq: Once | INTRAMUSCULAR | Status: AC
  Administered 2020-01-03: 125 mg via INTRAVENOUS
  Filled 2020-01-03: qty 2

## 2020-01-03 NOTE — ED Notes (Signed)
ED Provider at bedside. 

## 2020-01-03 NOTE — ED Triage Notes (Signed)
Pt BIB EMS from home. Pt has SOB with exertion at baseline with his hx of COPD. Pt states its worsening. EMS reports clear lungs. EMS reports pt EKG sinus tach. Pt has home O2 but does not use it regularly. Pt uses CiPAP at night.   114/70 106 HR 20 Resp 94% 4L [place]

## 2020-01-03 NOTE — ED Provider Notes (Signed)
[name] DEPT Provider Note   CSN: [id number] Arrival date & time: 01/03/20  1950     History Chief Complaint  Patient presents with  . Shortness of Breath    Mario [name] is a 69 y.o. male.  HPI  HPI: A 69 year old patient with a history of treated diabetes, hypertension and obesity presents for evaluation of chest pain. Initial onset of pain was less than one hour ago. The patient's chest pain is described as heaviness/pressure/tightness and is worse with exertion. The patient complains of nausea and reports some diaphoresis. The patient's chest pain is middle- or left-sided, is not well-localized, is not sharp and does radiate to the arms/jaw/neck. The patient has no history of stroke, has no history of peripheral artery disease, has not smoked in the past 90 days, has no relevant family history of coronary artery disease (first degree relative at less than age 19) and has no history of hypercholesterolemia.    Presents with progressive dyspnea over the last few weeks, Short of breath just walking to bathroom, worse with leaning forward to pick something up Rx oxygen for exertion and sleep with CPAP Tried the inhaler tonight when got out of the shower but had such severe dyspnea [facility] who [facility] and said to go to ED EMS Using daily inhaler Had chest pain, felt same as it has previously, like something sitting on chest has had this severely before, tonight was in chest with radiation to the throat Mild cough, feels like needs to cough something up but can't, has been chronic, was able to raise some green phlegm with spot of blood, other was light brown No fever  Dyspnea with laying down which started tonight Chest pressure, does acknowledge nausea, diaphoresis  88% is baseline   Past Medical History:  Diagnosis Date  . Anxiety   . Bronchitis   . COPD (chronic obstructive pulmonary disease) ([place])   . Depression   .  Hypertension   . Mental disorder   . MI (myocardial infarction) ([place])    ????  . OSA (obstructive sleep apnea)   . Suicide attempt ([place])   . Tension pneumothorax 06/27/2016  . Uveitis     Patient Active Problem List   Diagnosis Date Noted  . Vertigo 12/26/2019  . 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 Stage 3a chronic kidney disease ([place]) 12/26/2019  . Abnormal CT of the chest 12/26/2019  . COPD (chronic obstructive pulmonary disease) ([place])   . AKI (acute kidney injury) ([place])   . Hyperglycemia 06/23/2019  . OSA (obstructive sleep apnea) 06/23/2019  . Suicidal ideations 06/23/2019  . Major depressive disorder, recurrent episode ([place]) 10/12/2018  . 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 Cellulitis of both feet 07/16/2017  . DM2 (diabetes mellitus, type 2) ([place]) 07/16/2017  . HLA B27 (HLA B27 positive) 07/16/2017  . Acute chest pain   . Hyperkalemia   . Spontaneous pneumothorax 06/27/2016  . Hypoxia   . Pneumothorax on left   . Dyspnea 05/23/2014  . COPD exacerbation ([place]) 05/23/2014  . Malingering 01/21/2013  . Depression 01/11/2013  . Tobacco abuse 01/11/2013  . Obesity, unspecified 01/11/2013  . Chest pain 01/10/2013  . HTN (hypertension) 01/10/2013    Past Surgical History:  Procedure Laterality Date  . CHEST TUBE INSERTION Left 06/27/2016  . cryptorchidism    . SKIN CANCER EXCISION         Family History  Problem Relation Age of  Onset  . Dementia Father     Social History   Tobacco Use  . Smoking status: Former Smoker    Packs/day: 1.00    Years: 35.00    Pack years: 35.00    Types: Cigarettes    Quit date: 05/2016    Years since quitting: 3.6  . Smokeless tobacco: Never Used  Vaping Use  . Vaping Use: Never used  Substance Use Topics  . Alcohol use: No    Alcohol/week: 0.0 standard drinks    Comment: denies use of any drugs or alcohol  . Drug use: No    Home Medications Prior to Admission medications    Medication Sig Start Date End Date Taking? Authorizing Provider  buPROPion (WELLBUTRIN XL) 300 MG 24 hr tablet Take 300 mg by mouth daily.    Yes [provider]  busPIRone (BUSPAR) 15 MG tablet Take 15 mg by mouth daily.    Yes [provider]  calcium-vitamin D (OSCAL WITH D) 500-200 MG-UNIT tablet Take 1 tablet by mouth 2 (two) times daily with a meal.   Yes [provider]  Cholecalciferol (VITAMIN D-3) 25 MCG (1000 UT) CAPS Take 1,000 Units by mouth daily. IN ADDITION TO CALCIUM +D   Yes [provider]  cyclobenzaprine (FLEXERIL) 10 MG tablet Take 10 mg by mouth at bedtime.   Yes [provider]  diltiazem (CARDIZEM CD) 180 MG 24 hr capsule Take 180 mg by mouth daily.   Yes [provider]  docusate calcium (SURFAK) 240 MG capsule Take 240 mg by mouth daily.   Yes [provider]  DULoxetine (CYMBALTA) 60 MG capsule Take 1 capsule (60 mg total) by mouth 2 (two) times daily. Patient taking differently: Take 60 mg by mouth 2 (two) times daily with a meal.  07/21/17  Yes Lavina Hamman, MD  etanercept (ENBREL) 50 MG/ML injection Inject 50 mg into the skin once a week.    Yes [provider]  fluticasone (FLONASE) 50 MCG/ACT nasal spray Place 2 sprays into both nostrils daily.    Yes [provider]  folic acid (FOLVITE) 1 MG tablet Take 1 mg by mouth daily.   Yes [provider]  gabapentin (NEURONTIN) 300 MG capsule Take 2 capsules (600 mg total) by mouth 2 (two) times daily. Patient taking differently: Take 900 mg by mouth at bedtime. 3 capsules at bedtime 07/06/16  Yes Lars Pinks M, PA-C  guaifenesin (HUMIBID E) 400 MG TABS tablet Take 400 mg by mouth in the morning and at bedtime.    Yes [provider]  lurasidone (LATUDA) 40 MG TABS tablet Take 40 mg by mouth daily after supper.   Yes [provider]  Melatonin 3 MG TABS Take 6 mg by mouth at bedtime.   Yes [provider]  metFORMIN (GLUCOPHAGE) 500 MG tablet Take 500 mg by mouth in the morning and at bedtime.   Yes [provider]  methotrexate (RHEUMATREX) 2.5 MG tablet Take 6 tablets (15 mg total) by mouth every Saturday. Hold until seen by rheumatology  Caution:Chemotherapy. Protect from light. Patient taking differently: Take 15 mg by mouth once a week.  07/22/17  Yes Lavina Hamman, MD  Multiple Vitamin (MULTIVITAMIN WITH MINERALS) TABS tablet Take 1 tablet by mouth daily.   Yes [provider]  mycophenolate (CELLCEPT) 250 MG capsule Take 1,500 mg by mouth 2 (two) times daily.   Yes [provider]  nitroGLYCERIN (NITROSTAT) 0.4 MG SL tablet  Place 1 tablet (0.4 mg total) under the tongue every 5 (five) minutes as needed for chest pain. 07/03/19  Yes Danford, Suann Larry, MD  Oxcarbazepine (TRILEPTAL) 300 MG tablet Take 300 mg by mouth 2 (two) times daily. 2 tablets 3 to 4 times daily   Yes [provider]  OXYGEN Inhale 2 L/min into the lungs See admin instructions. 2 liters/min at bedtime with CPAP, during naps and all times of exertion   Yes [provider]  polyethylene glycol (MIRALAX / GLYCOLAX) 17 g packet Take 17 g by mouth daily.   Yes [provider]  prednisoLONE acetate (PRED FORTE) 1 % ophthalmic suspension Place 1 drop into both eyes See admin instructions. INSTILL 1 DROP IN LEFT EYE EVERY HOUR AND FOLLOW TAPER SCHEDULE FOR PRED FORTE FOR THE RIGHT EYE   Yes [provider]  PRESCRIPTION MEDICATION CPAP- At bedtime and during during any naps   Yes [provider]  Propylene Glycol (SYSTANE BALANCE) 0.6 % SOLN Place 1 drop into both eyes in the morning, at noon, and at bedtime.    Yes [provider]  sodium chloride (OCEAN) 0.65 % SOLN nasal spray Place 2 sprays into both nostrils 4 (four) times daily as needed for congestion.   Yes [provider]  tamsulosin (FLOMAX) 0.4 MG CAPS capsule Take  0.4 mg by mouth daily.   Yes [provider]  Tiotropium Bromide-Olodaterol (STIOLTO RESPIMAT) 2.5-2.5 MCG/ACT AERS Inhale 2 puffs into the lungs daily.    Yes [provider]  traMADol (ULTRAM) 50 MG tablet Take 50 mg by mouth at bedtime as needed.    Yes [provider]  traZODone (DESYREL) 50 MG tablet Take 75 mg by mouth at bedtime.    Yes [provider]  vitamin C (ASCORBIC ACID) 500 MG tablet Take 500 mg by mouth 2 (two) times daily.    Yes [provider]    Allergies    Demerol [meperidine], Zocor [simvastatin], Beet [beta vulgaris], and Liver  Review of Systems   Review of Systems  Constitutional: Positive for diaphoresis. Negative for fever.  HENT: Negative for congestion and sore throat.   Eyes: Negative for visual disturbance.  Respiratory: Positive for cough, shortness of breath and wheezing (yesterday).   Cardiovascular: Positive for chest pain. Negative for leg swelling.  Gastrointestinal: Positive for nausea. Negative for abdominal pain and vomiting.  Genitourinary: Negative for difficulty urinating.  Musculoskeletal: Negative for back pain and neck stiffness.  Skin: Negative for rash.  Neurological: Positive for light-headedness and headaches. Negative for syncope.    Physical Exam Updated Vital Signs BP 119/83   Pulse 98   Temp 98.3 F (36.8 C) (Oral)   Resp 18   Ht '6\' 2"'  (1.88 m)   Wt (!) 154.2 kg   SpO2 92%   BMI 43.65 kg/m   Physical Exam Vitals and nursing note reviewed.  Constitutional:      General: He is not in acute distress.    Appearance: He is well-developed. He is not diaphoretic.  HENT:     Head: Normocephalic and atraumatic.  Eyes:     Conjunctiva/sclera: Conjunctivae normal.  Cardiovascular:     Rate and Rhythm: Normal rate and regular rhythm.     Heart sounds: Normal heart sounds. No murmur heard.  No friction rub. No gallop.      Comments: Does not want to lay down due to dyspnea--exam  limited by body habitus no signs of obvious JVD  Pulmonary:     Effort: Pulmonary effort is normal. No respiratory distress.     Breath sounds: Normal breath sounds. No wheezing or rales.  Abdominal:     General: There is no distension.     Palpations: Abdomen is soft.     Tenderness: There is no abdominal tenderness. There is no guarding.  Musculoskeletal:     Cervical back: Normal range of motion.     Right lower leg: Right lower leg edema: trace.     Left lower leg: Left lower leg edema: trace.  Skin:    General: Skin is warm and dry.  Neurological:     Mental Status: He is alert and oriented to person, place, and time.     ED Results / Procedures / Treatments   Labs (all labs ordered are listed, but only abnormal results are displayed) Labs Reviewed  CBC WITH DIFFERENTIAL/PLATELET - Abnormal; Notable for the following components:      Result Value   WBC 14.5 (*)    Neutro Abs 8.7 (*)    Lymphs Abs 4.4 (*)    Monocytes Absolute 1.1 (*)    All other components within normal limits  COMPREHENSIVE METABOLIC PANEL - Abnormal; Notable for the following components:   Glucose, Bld 118 (*)    All other components within normal limits  RESPIRATORY PANEL BY RT PCR (FLU A&B, COVID)  D-DIMER, QUANTITATIVE (NOT AT [facility])  BRAIN NATRIURETIC PEPTIDE  TROPONIN I (HIGH SENSITIVITY)    EKG EKG Interpretation  Date/Time:  Friday January 03 2020 22:51:16 EDT Ventricular Rate:  97 PR Interval:    QRS Duration: 108 QT Interval:  338 QTC Calculation: 430 R Axis:   3 Text Interpretation: Sinus rhythm Borderline prolonged PR interval Low voltage, precordial leads Minimal ST elevation, inferior leads No significant change since last tracing Confirmed by Gareth Morgan [phone]) on 01/03/2020 11:31:53 PM   Radiology DG Chest 2 View  Result Date: 01/03/2020 CLINICAL DATA:  Shortness of breath EXAM: CHEST - 2 VIEW COMPARISON:  12/25/2019 FINDINGS: Cardiac shadow is stable. Mild scarring  is noted in the left lung base stable from prior exam. Hyperinflation of the lungs is again seen and stable. No bony abnormality is noted. IMPRESSION: No acute abnormality noted. Electronically Signed   By: Inez Catalina M.D.   On: 01/03/2020 22:41    Procedures Procedures (including critical care time)  Medications Ordered in ED Medications  methylPREDNISolone sodium succinate (SOLU-MEDROL) 125 mg/2 mL injection 125 mg (125 mg Intravenous Given 01/03/20 2302)  albuterol (VENTOLIN HFA) 108 (90 Base) MCG/ACT inhaler 2 puff (2 puffs Inhalation Given 01/03/20 2302)    ED Course  I have reviewed the triage vital signs and the nursing notes.  Pertinent labs & imaging results that were available during my care of the patient were reviewed by me and considered in my medical decision making (see chart for details).    MDM Rules/Calculators/A&P HEAR Score: 69                         69 year old male with history of COPD who reports he is prescribed oxygen for ambulation and 2 L to wear with his CPAP at night, history of pneumothorax, hypertension, depression, recent admission with concern for dizziness with a CT PE study which showed no evidence of PE but did show findings of a left-sided cavitary lesion, who presents with acute worsening shortness of breath tonight with associated chest pain.  Differential diagnosis  for dyspnea includes ACS, PE, COPD exacerbation, CHF exacerbation, anemia, pneumonia, viral etiology such as COVID 19 infection, metabolic abnormality.  Chest x-ray was done which showed no acute abnormalities. EKG was evaluated by me which showed no acute findings.  Low risk [place] with recent negative CTA for PE and negative ddimer.    No significant anemia or electrolyte abnormalities.  Does have history that suggest orthopnea.  BNP is pending.  Describes chest pain with typical features and radiation to the neck, and feel that his exertional symptoms may also be secondary to cardiac  etiology.  Provided Solu-Medrol and albuterol for possible COPD exacerbation, however he does not have significant wheezing or tachypnea to suggest this as an etiology of his symptoms.  Describes orthopnea, BNP pending at time of transfer of care.  Describes typical type of chest pressure symptoms with radiation to neck that accompanied his shortness of breath tonight.  HEART score elevated. Troponin, BNP pending at time of transfer or care.      Final Clinical Impression(s) / ED Diagnoses Final diagnoses:  Chest pain, unspecified type  Shortness of breath    Rx / DC Orders ED Discharge Orders    None       Gareth Morgan, MD 01/03/20 2350

## 2020-01-04 DIAGNOSIS — J9621 Acute and chronic respiratory failure with hypoxia: Secondary | ICD-10-CM | POA: Diagnosis not present

## 2020-01-04 DIAGNOSIS — I10 Essential (primary) hypertension: Secondary | ICD-10-CM | POA: Diagnosis not present

## 2020-01-04 DIAGNOSIS — J441 Chronic obstructive pulmonary disease with (acute) exacerbation: Secondary | ICD-10-CM

## 2020-01-04 DIAGNOSIS — G8929 Other chronic pain: Secondary | ICD-10-CM

## 2020-01-04 DIAGNOSIS — E119 Type 2 diabetes mellitus without complications: Secondary | ICD-10-CM

## 2020-01-04 LAB — TROPONIN I (HIGH SENSITIVITY)
Troponin I (High Sensitivity): 5 ng/L (ref <18)
Troponin I (High Sensitivity): 5 ng/L (ref <18)

## 2020-01-04 LAB — CREATININE, SERUM
Creatinine, Ser: 1.1 mg/dL (ref 0.61–1.24)
GFR, Estimated: >60 mL/min (ref >60)

## 2020-01-04 LAB — RESPIRATORY PANEL BY RT PCR (FLU A&B, COVID)
Influenza A by PCR: NEGATIVE
Influenza B by PCR: NEGATIVE
SARS Coronavirus 2 by RT PCR: NEGATIVE

## 2020-01-04 LAB — BRAIN NATRIURETIC PEPTIDE: B Natriuretic Peptide: 55.2 pg/mL (ref 0.0–100.0)

## 2020-01-04 LAB — CBC
HCT: 46.1 % (ref 39.0–52.0)
Hemoglobin: 15.3 g/dL (ref 13.0–17.0)
MCH: 32.6 pg (ref 26.0–34.0)
MCHC: 33.2 g/dL (ref 30.0–36.0)
MCV: 98.3 fL (ref 80.0–100.0)
Platelets: 312 10*3/uL (ref 150–400)
RBC: 4.69 MIL/uL (ref 4.22–5.81)
RDW: 13.6 % (ref 11.5–15.5)
WBC: 14.6 10*3/uL — ABNORMAL HIGH (ref 4.0–10.5)
nRBC: 0 % (ref 0.0–0.2)

## 2020-01-04 LAB — HEMOGLOBIN A1C
Hgb A1c MFr Bld: 7.5 % — ABNORMAL HIGH (ref 4.8–5.6)
Mean Plasma Glucose: 168.55 mg/dL

## 2020-01-04 LAB — GLUCOSE, CAPILLARY
Glucose-Capillary: 193 mg/dL — ABNORMAL HIGH (ref 70–99)
Glucose-Capillary: 217 mg/dL — ABNORMAL HIGH (ref 70–99)

## 2020-01-04 MED ORDER — PREDNISONE 20 MG PO TABS
40.0000 mg | ORAL_TABLET | Freq: Every day | ORAL | Status: DC
  Filled 2020-01-04: qty 2

## 2020-01-04 MED ORDER — GUAIFENESIN ER 600 MG PO TB12
600.0000 mg | ORAL_TABLET | Freq: Two times a day (BID) | ORAL | Status: DC
  Administered 2020-01-05: 600 mg via ORAL
  Filled 2020-01-04 (×2): qty 1

## 2020-01-04 MED ORDER — ENOXAPARIN SODIUM 80 MG/0.8ML ~~LOC~~ SOLN
80.0000 mg | SUBCUTANEOUS | Status: DC
  Administered 2020-01-04 – 2020-01-05 (×2): 80 mg via SUBCUTANEOUS
  Filled 2020-01-04 (×2): qty 0.8

## 2020-01-04 MED ORDER — FOLIC ACID 1 MG PO TABS
1.0000 mg | ORAL_TABLET | Freq: Every day | ORAL | Status: DC
  Administered 2020-01-05 – 2020-01-06 (×2): 1 mg via ORAL
  Filled 2020-01-04 (×2): qty 1

## 2020-01-04 MED ORDER — LURASIDONE HCL 40 MG PO TABS
40.0000 mg | ORAL_TABLET | Freq: Every day | ORAL | Status: DC
  Administered 2020-01-04 – 2020-01-05 (×2): 40 mg via ORAL
  Filled 2020-01-04 (×3): qty 1

## 2020-01-04 MED ORDER — ALBUTEROL SULFATE (2.5 MG/3ML) 0.083% IN NEBU: 2.5000 mg | INHALATION_SOLUTION | RESPIRATORY_TRACT | Status: DC | PRN

## 2020-01-04 MED ORDER — MELATONIN 3 MG PO TABS
6.0000 mg | ORAL_TABLET | Freq: Every day | ORAL | Status: DC
  Administered 2020-01-04 – 2020-01-05 (×2): 6 mg via ORAL
  Filled 2020-01-04 (×2): qty 2

## 2020-01-04 MED ORDER — TRAZODONE HCL 50 MG PO TABS
75.0000 mg | ORAL_TABLET | Freq: Every day | ORAL | Status: DC
  Administered 2020-01-04 – 2020-01-05 (×2): 75 mg via ORAL
  Filled 2020-01-04 (×2): qty 2

## 2020-01-04 MED ORDER — ARFORMOTEROL TARTRATE 15 MCG/2ML IN NEBU
15.0000 ug | INHALATION_SOLUTION | Freq: Two times a day (BID) | RESPIRATORY_TRACT | Status: DC
  Administered 2020-01-04 – 2020-01-06 (×4): 15 ug via RESPIRATORY_TRACT
  Filled 2020-01-04 (×5): qty 2

## 2020-01-04 MED ORDER — ACETAMINOPHEN 325 MG PO TABS
650.0000 mg | ORAL_TABLET | Freq: Once | ORAL | Status: AC
  Administered 2020-01-04: 650 mg via ORAL
  Filled 2020-01-04: qty 2

## 2020-01-04 MED ORDER — BUSPIRONE HCL 5 MG PO TABS
15.0000 mg | ORAL_TABLET | Freq: Every day | ORAL | Status: DC
  Administered 2020-01-05 – 2020-01-06 (×2): 15 mg via ORAL
  Filled 2020-01-04 (×2): qty 1

## 2020-01-04 MED ORDER — FLUTICASONE PROPIONATE 50 MCG/ACT NA SUSP
2.0000 | Freq: Every day | NASAL | Status: DC
  Administered 2020-01-05 – 2020-01-06 (×2): 2 via NASAL
  Filled 2020-01-04: qty 16

## 2020-01-04 MED ORDER — METHYLPREDNISOLONE SODIUM SUCC 125 MG IJ SOLR
60.0000 mg | Freq: Two times a day (BID) | INTRAMUSCULAR | Status: AC
  Administered 2020-01-04 – 2020-01-05 (×2): 60 mg via INTRAVENOUS
  Filled 2020-01-04 (×2): qty 2

## 2020-01-04 MED ORDER — DOCUSATE SODIUM 100 MG PO CAPS
100.0000 mg | ORAL_CAPSULE | Freq: Every day | ORAL | Status: DC
  Administered 2020-01-05 – 2020-01-06 (×2): 100 mg via ORAL
  Filled 2020-01-04 (×2): qty 1

## 2020-01-04 MED ORDER — VITAMIN D 25 MCG (1000 UNIT) PO TABS
1000.0000 [IU] | ORAL_TABLET | Freq: Every day | ORAL | Status: DC
  Administered 2020-01-05 – 2020-01-06 (×2): 1000 [IU] via ORAL
  Filled 2020-01-04 (×2): qty 1

## 2020-01-04 MED ORDER — INSULIN ASPART 100 UNIT/ML ~~LOC~~ SOLN
0.0000 [IU] | Freq: Three times a day (TID) | SUBCUTANEOUS | Status: DC
  Administered 2020-01-04: 3 [IU] via SUBCUTANEOUS
  Administered 2020-01-05: 8 [IU] via SUBCUTANEOUS
  Administered 2020-01-05: 10 [IU] via SUBCUTANEOUS
  Administered 2020-01-05: 2 [IU] via SUBCUTANEOUS
  Administered 2020-01-06: 5 [IU] via SUBCUTANEOUS
  Administered 2020-01-06: 2 [IU] via SUBCUTANEOUS

## 2020-01-04 MED ORDER — ARFORMOTEROL TARTRATE 15 MCG/2ML IN NEBU
15.0000 ug | INHALATION_SOLUTION | Freq: Two times a day (BID) | RESPIRATORY_TRACT | Status: DC
  Filled 2020-01-04: qty 2

## 2020-01-04 MED ORDER — BUPROPION HCL ER (XL) 300 MG PO TB24
300.0000 mg | ORAL_TABLET | Freq: Every day | ORAL | Status: DC
  Administered 2020-01-05 – 2020-01-06 (×2): 300 mg via ORAL
  Filled 2020-01-04 (×2): qty 1

## 2020-01-04 MED ORDER — MYCOPHENOLATE MOFETIL 250 MG PO CAPS
1500.0000 mg | ORAL_CAPSULE | Freq: Two times a day (BID) | ORAL | Status: DC
  Administered 2020-01-05 – 2020-01-06 (×3): 1500 mg via ORAL
  Filled 2020-01-04 (×4): qty 6

## 2020-01-04 MED ORDER — METHOTREXATE 2.5 MG PO TABS: 15.0000 mg | ORAL_TABLET | ORAL | Status: DC

## 2020-01-04 MED ORDER — TAMSULOSIN HCL 0.4 MG PO CAPS
0.4000 mg | ORAL_CAPSULE | Freq: Every day | ORAL | Status: DC
  Administered 2020-01-05 – 2020-01-06 (×2): 0.4 mg via ORAL
  Filled 2020-01-04 (×2): qty 1

## 2020-01-04 MED ORDER — DILTIAZEM HCL ER COATED BEADS 180 MG PO CP24
180.0000 mg | ORAL_CAPSULE | Freq: Every day | ORAL | Status: DC
  Administered 2020-01-04 – 2020-01-06 (×3): 180 mg via ORAL
  Filled 2020-01-04 (×3): qty 1

## 2020-01-04 MED ORDER — INSULIN ASPART 100 UNIT/ML ~~LOC~~ SOLN
0.0000 [IU] | Freq: Every day | SUBCUTANEOUS | Status: DC
  Administered 2020-01-04 – 2020-01-05 (×2): 2 [IU] via SUBCUTANEOUS

## 2020-01-04 MED ORDER — ASCORBIC ACID 500 MG PO TABS
500.0000 mg | ORAL_TABLET | Freq: Two times a day (BID) | ORAL | Status: DC
  Administered 2020-01-04 – 2020-01-06 (×4): 500 mg via ORAL
  Filled 2020-01-04 (×4): qty 1

## 2020-01-04 MED ORDER — ONDANSETRON HCL 4 MG/2ML IJ SOLN: 4.0000 mg | Freq: Four times a day (QID) | INTRAMUSCULAR | Status: DC | PRN

## 2020-01-04 MED ORDER — HYDROCODONE-ACETAMINOPHEN 7.5-325 MG PO TABS
1.0000 | ORAL_TABLET | Freq: Four times a day (QID) | ORAL | Status: DC | PRN
  Administered 2020-01-04 – 2020-01-06 (×4): 1 via ORAL
  Filled 2020-01-04 (×4): qty 1

## 2020-01-04 MED ORDER — TRAMADOL HCL 50 MG PO TABS
50.0000 mg | ORAL_TABLET | Freq: Three times a day (TID) | ORAL | Status: DC | PRN
  Administered 2020-01-05: 50 mg via ORAL
  Filled 2020-01-04: qty 1

## 2020-01-04 MED ORDER — OXCARBAZEPINE 300 MG PO TABS
300.0000 mg | ORAL_TABLET | Freq: Two times a day (BID) | ORAL | Status: DC
  Administered 2020-01-04 – 2020-01-06 (×4): 300 mg via ORAL
  Filled 2020-01-04 (×4): qty 1

## 2020-01-04 MED ORDER — FLUTICASONE FUROATE-VILANTEROL 100-25 MCG/INH IN AEPB
1.0000 | INHALATION_SPRAY | Freq: Every day | RESPIRATORY_TRACT | Status: DC
  Filled 2020-01-04: qty 28

## 2020-01-04 MED ORDER — POLYETHYLENE GLYCOL 3350 17 G PO PACK: 17.0000 g | PACK | Freq: Every day | ORAL | Status: DC

## 2020-01-04 MED ORDER — POLYVINYL ALCOHOL 1.4 % OP SOLN
1.0000 [drp] | Freq: Three times a day (TID) | OPHTHALMIC | Status: DC
  Administered 2020-01-04 – 2020-01-06 (×3): 1 [drp] via OPHTHALMIC
  Filled 2020-01-04: qty 15

## 2020-01-04 MED ORDER — SALINE SPRAY 0.65 % NA SOLN
2.0000 | Freq: Four times a day (QID) | NASAL | Status: DC | PRN
  Administered 2020-01-04: 2 via NASAL
  Filled 2020-01-04: qty 44

## 2020-01-04 MED ORDER — CYCLOBENZAPRINE HCL 10 MG PO TABS
10.0000 mg | ORAL_TABLET | Freq: Every day | ORAL | Status: DC
  Administered 2020-01-04 – 2020-01-05 (×2): 10 mg via ORAL
  Filled 2020-01-04 (×2): qty 1

## 2020-01-04 MED ORDER — PREDNISOLONE ACETATE 1 % OP SUSP
1.0000 [drp] | Freq: Two times a day (BID) | OPHTHALMIC | Status: DC
  Administered 2020-01-05 – 2020-01-06 (×3): 1 [drp] via OPHTHALMIC
  Filled 2020-01-04: qty 5

## 2020-01-04 MED ORDER — GABAPENTIN 300 MG PO CAPS
900.0000 mg | ORAL_CAPSULE | Freq: Every day | ORAL | Status: DC
  Administered 2020-01-04 – 2020-01-05 (×2): 900 mg via ORAL
  Filled 2020-01-04 (×2): qty 3

## 2020-01-04 MED ORDER — ONDANSETRON HCL 4 MG PO TABS: 4.0000 mg | ORAL_TABLET | Freq: Four times a day (QID) | ORAL | Status: DC | PRN

## 2020-01-04 MED ORDER — POLYVINYL ALCOHOL 1.4 % OP SOLN
1.0000 [drp] | OPHTHALMIC | Status: DC | PRN
  Administered 2020-01-05: 1 [drp] via OPHTHALMIC
  Filled 2020-01-04: qty 15

## 2020-01-04 MED ORDER — DULOXETINE HCL 60 MG PO CPEP
60.0000 mg | ORAL_CAPSULE | Freq: Two times a day (BID) | ORAL | Status: DC
  Administered 2020-01-04 – 2020-01-06 (×5): 60 mg via ORAL
  Filled 2020-01-04: qty 2
  Filled 2020-01-04 (×4): qty 1

## 2020-01-04 MED ORDER — CALCIUM CARBONATE-VITAMIN D 500-200 MG-UNIT PO TABS
1.0000 | ORAL_TABLET | Freq: Two times a day (BID) | ORAL | Status: DC
  Administered 2020-01-05 – 2020-01-06 (×3): 1 via ORAL
  Filled 2020-01-04 (×3): qty 1

## 2020-01-04 NOTE — H&P (Signed)
History and Physical    Mario [name] VOH:[id number] DOB: Mar 05, 1950 DOA: 01/03/2020  PCP: [facility]  Patient coming from: Home  Chief Complaint: "hard to breathe"  HPI: Mario [name] is a 69 y.o. male with medical history significant of HTN, COPD, chronic hypoxic respiratory failure w/ home O2 use. Presenting with 3 weeks of worsening dyspnea. Reports that over the past 3 weeks he's had a harder time catching his breath. Simple actions -- especially showering -- leaves him winded. He's been taking his normal COPD medications and having to use his rescue inhaler more often. He reports intermittent chest pressure like his regular angina during this time, but denies any other symptoms. After failing to turn the corner by yesterday, he decided it was time to come to the ED. He denies any other alleviating or aggravating factors. He denies any other treatments.    ED Course: CXR was negative of acute process. He was dountd to be hypoxic and tachypneic. EKG was performed that did not show STEMI. His trp were negative x 2 and his BNP was normal. He was given solumedrol and albuterol and began to feel better. TRH was called for admission.      Review of Systems:  Denies fever, productive cough, sick contacts, lightheadedness, dizziness, N/V/D. Review of systems is otherwise negative for all not mentioned in HPI.   PMHx Past Medical History:  Diagnosis Date  . Anxiety   . Bronchitis   . COPD (chronic obstructive pulmonary disease) ([place])   . Depression   . Hypertension   . Mental disorder   . MI (myocardial infarction) ([place])    ????  . OSA (obstructive sleep apnea)   . Suicide attempt ([place])   . Tension pneumothorax 06/27/2016  . Uveitis     PSHx Past Surgical History:  Procedure Laterality Date  . CHEST TUBE INSERTION Left 06/27/2016  . cryptorchidism    . SKIN CANCER EXCISION      SocHx  reports that he quit smoking about 3 years ago. His smoking use included  cigarettes. He has a 35.00 pack-year smoking history. He has never used smokeless tobacco. He reports that he does not drink alcohol and does not use drugs.  Allergies  Allergen Reactions  . Demerol [Meperidine] Nausea And Vomiting and Other (See Comments)    Made the patient "violently sick"  . Zocor [Simvastatin] Nausea And Vomiting and Other (See Comments)    Made him very jittery, also  . Beet [Beta Vulgaris] Nausea And Vomiting  . Liver Nausea And Vomiting    FamHx Family History  Problem Relation Age of Onset  . Dementia Father     Prior to Admission medications   Medication Sig Start Date End Date Taking? Authorizing Provider  buPROPion (WELLBUTRIN XL) 300 MG 24 hr tablet Take 300 mg by mouth daily.    Yes [provider]  busPIRone (BUSPAR) 15 MG tablet Take 15 mg by mouth daily.    Yes [provider]  calcium-vitamin D (OSCAL WITH D) 500-200 MG-UNIT tablet Take 1 tablet by mouth 2 (two) times daily with a meal.   Yes [provider]  Cholecalciferol (VITAMIN D-3) 25 MCG (1000 UT) CAPS Take 1,000 Units by mouth daily. IN ADDITION TO CALCIUM +D   Yes [provider]  cyclobenzaprine (FLEXERIL) 10 MG tablet Take 10 mg by mouth at bedtime.   Yes [provider]  diltiazem (CARDIZEM CD) 180 MG 24 hr capsule Take 180 mg by mouth daily.  Yes [provider]  docusate calcium (SURFAK) 240 MG capsule Take 240 mg by mouth daily.   Yes [provider]  DULoxetine (CYMBALTA) 60 MG capsule Take 1 capsule (60 mg total) by mouth 2 (two) times daily. Patient taking differently: Take 60 mg by mouth 2 (two) times daily with a meal.  07/21/17  Yes Lavina Hamman, MD  etanercept (ENBREL) 50 MG/ML injection Inject 50 mg into the skin once a week.    Yes [provider]  fluticasone (FLONASE) 50 MCG/ACT nasal spray Place 2 sprays into both nostrils daily.    Yes [provider]  folic acid (FOLVITE) 1 MG tablet Take  1 mg by mouth daily.   Yes [provider]  gabapentin (NEURONTIN) 300 MG capsule Take 2 capsules (600 mg total) by mouth 2 (two) times daily. Patient taking differently: Take 900 mg by mouth at bedtime. 3 capsules at bedtime 07/06/16  Yes Lars Pinks M, PA-C  guaifenesin (HUMIBID E) 400 MG TABS tablet Take 400 mg by mouth in the morning and at bedtime.    Yes [provider]  lurasidone (LATUDA) 40 MG TABS tablet Take 40 mg by mouth daily after supper.   Yes [provider]  Melatonin 3 MG TABS Take 6 mg by mouth at bedtime.   Yes [provider]  metFORMIN (GLUCOPHAGE) 500 MG tablet Take 500 mg by mouth in the morning and at bedtime.   Yes [provider]  methotrexate (RHEUMATREX) 2.5 MG tablet Take 6 tablets (15 mg total) by mouth every Saturday. Hold until seen by rheumatology  Caution:Chemotherapy. Protect from light. Patient taking differently: Take 15 mg by mouth once a week.  07/22/17  Yes Lavina Hamman, MD  Multiple Vitamin (MULTIVITAMIN WITH MINERALS) TABS tablet Take 1 tablet by mouth daily.   Yes [provider]  mycophenolate (CELLCEPT) 250 MG capsule Take 1,500 mg by mouth 2 (two) times daily.   Yes [provider]  nitroGLYCERIN (NITROSTAT) 0.4 MG SL tablet Place 1 tablet (0.4 mg total) under the tongue every 5 (five) minutes as needed for chest pain. 07/03/19  Yes Danford, Suann Larry, MD  Oxcarbazepine (TRILEPTAL) 300 MG tablet Take 300 mg by mouth 2 (two) times daily. 2 tablets 3 to 4 times daily   Yes [provider]  OXYGEN Inhale 2 L/min into the lungs See admin instructions. 2 liters/min at bedtime with CPAP, during naps and all times of exertion   Yes [provider]  polyethylene glycol (MIRALAX / GLYCOLAX) 17 g packet Take 17 g by mouth daily.   Yes [provider]  prednisoLONE acetate (PRED FORTE) 1 % ophthalmic suspension Place 1 drop into both eyes See admin instructions.  INSTILL 1 DROP IN LEFT EYE EVERY HOUR AND FOLLOW TAPER SCHEDULE FOR PRED FORTE FOR THE RIGHT EYE   Yes [provider]  PRESCRIPTION MEDICATION CPAP- At bedtime and during during any naps   Yes [provider]  Propylene Glycol (SYSTANE BALANCE) 0.6 % SOLN Place 1 drop into both eyes in the morning, at noon, and at bedtime.    Yes [provider]  sodium chloride (OCEAN) 0.65 % SOLN nasal spray Place 2 sprays into both nostrils 4 (four) times daily as needed for congestion.   Yes [provider]  tamsulosin (FLOMAX) 0.4 MG CAPS capsule Take 0.4 mg by mouth daily.   Yes [provider]  Tiotropium Bromide-Olodaterol (STIOLTO RESPIMAT) 2.5-2.5 MCG/ACT AERS Inhale 2 puffs  into the lungs daily.    Yes [provider]  traMADol (ULTRAM) 50 MG tablet Take 50 mg by mouth at bedtime as needed.    Yes [provider]  traZODone (DESYREL) 50 MG tablet Take 75 mg by mouth at bedtime.    Yes [provider]  vitamin C (ASCORBIC ACID) 500 MG tablet Take 500 mg by mouth 2 (two) times daily.    Yes [provider]    Physical Exam: Vitals:   01/04/20 0615 01/04/20 0630 01/04/20 0703 01/04/20 0730  BP: (!) 143/91 (!) 154/91 (!) 160/92 (!) 166/102  Pulse: (!) 108 (!) 107 (!) 110 (!) 110  Resp:   18 18  Temp:      TempSrc:      SpO2: 91% 91% 90% 91%  Weight:      Height:        General: 69 y.o. male resting in bed in NAD Eyes: PERRL, normal sclera ENMT: Nares patent w/o discharge, orophaynx clear, dentition normal, ears w/o discharge/lesions/ulcers Neck: Supple, trachea midline Cardiovascular: RRR, +S1, S2, no m/g/r, equal pulses throughout Respiratory: soft exp wheeze at bases, slightly increased WOB on 2.5L [place] GI: BS+, NDNT, no masses noted, no organomegaly noted MSK: No e/c/c Skin: Multiple scarred-in ulcerations on BLE c/w his Hx of vasculitis Neuro: A&O x 3, no focal deficits Psyc: Appropriate interaction and  affect, calm/cooperative  Labs on Admission: I have personally reviewed following labs and imaging studies  CBC: Recent Labs  Lab 01/03/20 2235  WBC 14.5*  NEUTROABS 8.7*  HGB 15.7  HCT 47.5  MCV 98.1  PLT 607   Basic Metabolic Panel: Recent Labs  Lab 01/03/20 2235  NA 135  K 4.4  CL 98  CO2 25  GLUCOSE 118*  BUN 21  CREATININE 1.20  CALCIUM 9.9   GFR: Estimated Creatinine Clearance: 92.5 mL/min (by C-G formula based on SCr of 1.2 mg/dL). Liver Function Tests: Recent Labs  Lab 01/03/20 2235  AST 24  ALT 37  ALKPHOS 64  BILITOT 0.6  PROT 7.8  ALBUMIN 4.6   No results for input(s): LIPASE, AMYLASE in the last 168 hours. No results for input(s): AMMONIA in the last 168 hours. Coagulation Profile: No results for input(s): INR, PROTIME in the last 168 hours. Cardiac Enzymes: No results for input(s): CKTOTAL, CKMB, CKMBINDEX, TROPONINI in the last 168 hours. BNP (last 3 results) No results for input(s): PROBNP in the last 8760 hours. HbA1C: No results for input(s): HGBA1C in the last 72 hours. CBG: Recent Labs  Lab 12/28/19 1236  GLUCAP 117*   Lipid Profile: No results for input(s): CHOL, HDL, LDLCALC, TRIG, CHOLHDL, LDLDIRECT in the last 72 hours. Thyroid Function Tests: No results for input(s): TSH, T4TOTAL, FREET4, T3FREE, THYROIDAB in the last 72 hours. Anemia Panel: No results for input(s): VITAMINB12, FOLATE, FERRITIN, TIBC, IRON, RETICCTPCT in the last 72 hours. Urine analysis:    Component Value Date/Time   COLORURINE YELLOW 12/25/2019 2238   APPEARANCEUR CLEAR 12/25/2019 2238   LABSPEC 1.025 12/25/2019 2238   PHURINE 5.0 12/25/2019 2238   GLUCOSEU NEGATIVE 12/25/2019 2238   [place] 12/25/2019 2238   BILIRUBINUR NEGATIVE 12/25/2019 2238   KETONESUR 5 (A) 12/25/2019 2238   PROTEINUR NEGATIVE 12/25/2019 2238   UROBILINOGEN 1.0 01/12/2013 0024   NITRITE NEGATIVE 12/25/2019 2238   LEUKOCYTESUR NEGATIVE 12/25/2019 2238     Radiological Exams on Admission: DG Chest 2 View  Result Date: 01/03/2020 CLINICAL DATA:  Shortness of breath EXAM:  CHEST - 2 VIEW COMPARISON:  12/25/2019 FINDINGS: Cardiac shadow is stable. Mild scarring is noted in the left lung base stable from prior exam. Hyperinflation of the lungs is again seen and stable. No bony abnormality is noted. IMPRESSION: No acute abnormality noted. Electronically Signed   By: Inez Catalina M.D.   On: 01/03/2020 22:41    EKG: Independently reviewed. Sinus, no ST change  Assessment/Plan COPD exacerbation Acute on chronic hypoxic respiratory failure     - admit to obs, telemetry     - nebs, steroids, guaifenesin, supplemental O2 (at baseline, he uses 2L [place] at night)     - CXR is w/o acute disease  HTN     - continue cardizem  DM2     - SSI, DM2 diet, glucose checks  Anxiety/Depression     - continue trileptal, latuda, cymbalta, wellbutrin, buspar  Chronic pain     - continue neurontin, flexeril, tramadol  Uveitis     - continue methotrexate, cellcept, cyclopentolate, enbrel  OSA     - continue CPAP w/ atleast 2L [place]  Morbid obesity     - counseled on lifestyle changes; follow up with PCP  DVT prophylaxis: lovenox  Code Status: FULL  Family Communication: None at bedside  Consults called: None  Status is: Observation  The patient remains OBS appropriate and will d/c before 2 midnights.  Dispo: The patient is from: Home              Anticipated d/c is to: Home              Anticipated d/c date is: 1 day              Patient currently is not medically stable to d/c.  Jonnie Finner DO [facility]  If 7PM-7AM, please contact night-coverage [email or web address]  01/04/2020, 7:46 AM

## 2020-01-05 DIAGNOSIS — N400 Enlarged prostate without lower urinary tract symptoms: Secondary | ICD-10-CM | POA: Diagnosis present

## 2020-01-05 DIAGNOSIS — H209 Unspecified iridocyclitis: Secondary | ICD-10-CM | POA: Diagnosis present

## 2020-01-05 DIAGNOSIS — E669 Obesity, unspecified: Secondary | ICD-10-CM | POA: Diagnosis not present

## 2020-01-05 DIAGNOSIS — Z6841 Body Mass Index (BMI) 40.0 and over, adult: Secondary | ICD-10-CM | POA: Diagnosis not present

## 2020-01-05 DIAGNOSIS — Z20822 Contact with and (suspected) exposure to covid-19: Secondary | ICD-10-CM | POA: Diagnosis present

## 2020-01-05 DIAGNOSIS — D72829 Elevated white blood cell count, unspecified: Secondary | ICD-10-CM

## 2020-01-05 DIAGNOSIS — F32A Depression, unspecified: Secondary | ICD-10-CM | POA: Diagnosis present

## 2020-01-05 DIAGNOSIS — E1165 Type 2 diabetes mellitus with hyperglycemia: Secondary | ICD-10-CM | POA: Diagnosis present

## 2020-01-05 DIAGNOSIS — E1169 Type 2 diabetes mellitus with other specified complication: Secondary | ICD-10-CM

## 2020-01-05 DIAGNOSIS — I252 Old myocardial infarction: Secondary | ICD-10-CM | POA: Diagnosis not present

## 2020-01-05 DIAGNOSIS — Z79891 Long term (current) use of opiate analgesic: Secondary | ICD-10-CM | POA: Diagnosis not present

## 2020-01-05 DIAGNOSIS — Z7984 Long term (current) use of oral hypoglycemic drugs: Secondary | ICD-10-CM | POA: Diagnosis not present

## 2020-01-05 DIAGNOSIS — Z9981 Dependence on supplemental oxygen: Secondary | ICD-10-CM | POA: Diagnosis not present

## 2020-01-05 DIAGNOSIS — R0602 Shortness of breath: Secondary | ICD-10-CM | POA: Diagnosis present

## 2020-01-05 DIAGNOSIS — E1122 Type 2 diabetes mellitus with diabetic chronic kidney disease: Secondary | ICD-10-CM | POA: Diagnosis present

## 2020-01-05 DIAGNOSIS — Z87891 Personal history of nicotine dependence: Secondary | ICD-10-CM | POA: Diagnosis not present

## 2020-01-05 DIAGNOSIS — I129 Hypertensive chronic kidney disease with stage 1 through stage 4 chronic kidney disease, or unspecified chronic kidney disease: Secondary | ICD-10-CM | POA: Diagnosis present

## 2020-01-05 DIAGNOSIS — F419 Anxiety disorder, unspecified: Secondary | ICD-10-CM | POA: Diagnosis present

## 2020-01-05 DIAGNOSIS — Z85828 Personal history of other malignant neoplasm of skin: Secondary | ICD-10-CM | POA: Diagnosis not present

## 2020-01-05 DIAGNOSIS — Z713 Dietary counseling and surveillance: Secondary | ICD-10-CM | POA: Diagnosis not present

## 2020-01-05 DIAGNOSIS — J9621 Acute and chronic respiratory failure with hypoxia: Secondary | ICD-10-CM | POA: Diagnosis present

## 2020-01-05 DIAGNOSIS — N1831 Chronic kidney disease, stage 3a: Secondary | ICD-10-CM | POA: Diagnosis present

## 2020-01-05 DIAGNOSIS — Z79899 Other long term (current) drug therapy: Secondary | ICD-10-CM | POA: Diagnosis not present

## 2020-01-05 DIAGNOSIS — Z9151 Personal history of suicidal behavior: Secondary | ICD-10-CM | POA: Diagnosis not present

## 2020-01-05 DIAGNOSIS — G4733 Obstructive sleep apnea (adult) (pediatric): Secondary | ICD-10-CM | POA: Diagnosis present

## 2020-01-05 DIAGNOSIS — J441 Chronic obstructive pulmonary disease with (acute) exacerbation: Principal | ICD-10-CM

## 2020-01-05 DIAGNOSIS — G894 Chronic pain syndrome: Secondary | ICD-10-CM | POA: Diagnosis present

## 2020-01-05 LAB — COMPREHENSIVE METABOLIC PANEL
ALT: 28 U/L (ref 0–44)
AST: 19 U/L (ref 15–41)
Albumin: 4.3 g/dL (ref 3.5–5.0)
Alkaline Phosphatase: 58 U/L (ref 38–126)
Anion gap: 12 (ref 5–15)
BUN: 19 mg/dL (ref 8–23)
CO2: 25 mmol/L (ref 22–32)
Calcium: 9.1 mg/dL (ref 8.9–10.3)
Chloride: 100 mmol/L (ref 98–111)
Creatinine, Ser: 1.07 mg/dL (ref 0.61–1.24)
GFR, Estimated: >60 mL/min (ref >60)
Glucose, Bld: 165 mg/dL — ABNORMAL HIGH (ref 70–99)
Potassium: 4.3 mmol/L (ref 3.5–5.1)
Sodium: 137 mmol/L (ref 135–145)
Total Bilirubin: 0.6 mg/dL (ref 0.3–1.2)
Total Protein: 6.9 g/dL (ref 6.5–8.1)

## 2020-01-05 LAB — CBC
HCT: 45 % (ref 39.0–52.0)
Hemoglobin: 14.9 g/dL (ref 13.0–17.0)
MCH: 32.7 pg (ref 26.0–34.0)
MCHC: 33.1 g/dL (ref 30.0–36.0)
MCV: 98.9 fL (ref 80.0–100.0)
Platelets: 278 10*3/uL (ref 150–400)
RBC: 4.55 MIL/uL (ref 4.22–5.81)
RDW: 13.9 % (ref 11.5–15.5)
WBC: 12.9 10*3/uL — ABNORMAL HIGH (ref 4.0–10.5)
nRBC: 0 % (ref 0.0–0.2)

## 2020-01-05 LAB — GLUCOSE, CAPILLARY
Glucose-Capillary: 252 mg/dL — ABNORMAL HIGH (ref 70–99)
Glucose-Capillary: 268 mg/dL — ABNORMAL HIGH (ref 70–99)
Glucose-Capillary: 204 mg/dL — ABNORMAL HIGH (ref 70–99)
Glucose-Capillary: 240 mg/dL — ABNORMAL HIGH (ref 70–99)

## 2020-01-05 MED ORDER — METHYLPREDNISOLONE SODIUM SUCC 125 MG IJ SOLR
60.0000 mg | Freq: Two times a day (BID) | INTRAMUSCULAR | Status: DC
  Administered 2020-01-05 – 2020-01-06 (×2): 60 mg via INTRAVENOUS
  Filled 2020-01-05 (×2): qty 2

## 2020-01-05 MED ORDER — BUDESONIDE 0.25 MG/2ML IN SUSP
0.2500 mg | Freq: Two times a day (BID) | RESPIRATORY_TRACT | Status: DC
  Administered 2020-01-05 – 2020-01-06 (×2): 0.25 mg via RESPIRATORY_TRACT
  Filled 2020-01-05 (×2): qty 2

## 2020-01-05 MED ORDER — IPRATROPIUM-ALBUTEROL 0.5-2.5 (3) MG/3ML IN SOLN: 3.0000 mL | Freq: Four times a day (QID) | RESPIRATORY_TRACT | Status: DC | PRN

## 2020-01-05 MED ORDER — LEVALBUTEROL HCL 0.63 MG/3ML IN NEBU: 0.6300 mg | INHALATION_SOLUTION | Freq: Four times a day (QID) | RESPIRATORY_TRACT | Status: DC

## 2020-01-05 MED ORDER — IPRATROPIUM BROMIDE 0.02 % IN SOLN: 0.5000 mg | Freq: Four times a day (QID) | RESPIRATORY_TRACT | Status: DC

## 2020-01-05 MED ORDER — ALBUTEROL SULFATE (2.5 MG/3ML) 0.083% IN NEBU: 2.5000 mg | INHALATION_SOLUTION | RESPIRATORY_TRACT | Status: DC | PRN

## 2020-01-05 MED ORDER — METHOTREXATE 2.5 MG PO TABS
15.0000 mg | ORAL_TABLET | ORAL | Status: DC
  Administered 2020-01-05: 15 mg via ORAL
  Filled 2020-01-05: qty 6

## 2020-01-05 MED ORDER — GUAIFENESIN ER 600 MG PO TB12
1200.0000 mg | ORAL_TABLET | Freq: Two times a day (BID) | ORAL | Status: DC
  Administered 2020-01-05: 1200 mg via ORAL
  Filled 2020-01-05: qty 2

## 2020-01-05 NOTE — Progress Notes (Signed)
Pt continues not wanting to wear CPAP

## 2020-01-05 NOTE — Plan of Care (Signed)
°  Problem: Education: Goal: Knowledge of General Education information will improve Description: Including pain rating scale, medication(s)/side effects and non-pharmacologic comfort measures Outcome: Progressing   Problem: Health Behavior/Discharge Planning: Goal: Ability to manage health-related needs will improve Outcome: Progressing   Problem: Clinical Measurements: Goal: Ability to maintain clinical measurements within normal limits will improve Outcome: Progressing Goal: Will remain free from infection Outcome: Progressing Goal: Diagnostic test results will improve Outcome: Progressing Goal: Respiratory complications will improve Outcome: Progressing Goal: Cardiovascular complication will be avoided Outcome: Progressing   Problem: Nutrition: Goal: Adequate nutrition will be maintained Outcome: Progressing   Problem: Coping: Goal: Level of anxiety will decrease Outcome: Progressing   Problem: Pain Managment: Goal: General experience of comfort will improve Outcome: Progressing   Problem: Safety: Goal: Ability to remain free from injury will improve Outcome: Progressing   Problem: Skin Integrity: Goal: Risk for impaired skin integrity will decrease Outcome: Progressing

## 2020-01-05 NOTE — Progress Notes (Signed)
PROGRESS NOTE    Mario [name]  GLO:[id number] DOB: [birth year] DOA: 01/03/2020 PCP: [facility], [facility]   Brief Narrative:  HPI per Dr. Cherylann Ratel on 01/04/20 Mario [name] is a 69 y.o. male with medical history significant of HTN, COPD, chronic hypoxic respiratory failure w/ home O2 use. Presenting with 3 weeks of worsening dyspnea. Reports that over the past 3 weeks he's had a harder time catching his breath. Simple actions -- especially showering -- leaves him winded. He's been taking his normal COPD medications and having to use his rescue inhaler more often. He reports intermittent chest pressure like his regular angina during this time, but denies any other symptoms. After failing to turn the corner by yesterday, he decided it was time to come to the ED. He denies any other alleviating or aggravating factors. He denies any other treatments.    ED Course: CXR was negative of acute process. He was dountd to be hypoxic and tachypneic. EKG was performed that did not show STEMI. His trp were negative x 2 and his BNP was normal. He was given solumedrol and albuterol and began to feel better. TRH was called for admission.      Review of Systems:  Denies fever, productive cough, sick contacts, lightheadedness, dizziness, N/V/D. Review of systems is otherwise negative for all not mentioned in HPI.   **Interim History Patient feels a little bit better but still not back to his baseline is still felt somewhat dyspneic.  He thinks he is improving well.  Breathing treatments have been changed around and have added guaifenesin, flutter valve, incentive spirometry and added back IV steroids.  Anticipating discharging home in the a.m. after home O2 screen.  Assessment & Plan:   Active Problems:   COPD exacerbation (HCC)  Acute Exacerbation of COPD Acute on Chronic Respiratory Failure with Hypoxia -Admit to obs, telemetry but still not back to Baseline -Continue with as needed albuterol nebs  2.5 nebs every every 2 as needed for wheezing, started on Brovana 15 mcg neb twice daily as well as adding budesonide 0.25 mg neb twice daily.  We will discontinue his Adair Patter as patient takes Stiolto at home -Started Xopenex and Atrovent breathing treatments scheduled every 6 Continue with guaifenesin 1200 g p.o. twice daily, flutter valve, incentive spirometry -Continue supplemental oxygen via nasal cannula; wears 2 L of oxygen via nasal cannula nightly with his CPAP -Continue with fluticasone 2 sprays in each nare daily -He received IV Solu-Medrol 60 mg every 12 hours and then this was changed to 40 mg p.o. daily but will hold 40 p.o. daily and continue with 60 every 12 and wean to 60 daily and sent home on a prednisone taper -Patient had a leukocytosis of 14.6 and this is now trended down to 12.9 -Chest x-ray showed "Cardiac shadow is stable. Mild scarring is noted in the left lung base stable from prior exam. Hyperinflation of the lungs is again seen and stable. No bony abnormality is noted."  HTN -Continue Diltiazem 170 mg po Daily -Continue to Monitor BP per Protocol -Last BP was   DM2 -C/w Moderate Novolog SSI AC/HS -HbA1c was 7.5 -CBG's ranging from 193-268 -Continue to Monitor Blood Sugars carefully and adjust Insulin Regimen as Necessary given that he will likley have elevated blood sugar 2/2 to Steroid Demargination   BPH -Continue Tamsulosin 0.4 mg p.o. 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   Weight as of this encounter: 154.2 kg. -Weight loss and Dietary Counseling given   DVT prophylaxis: Enoxaparin 80 mg sq q24h Code Status: FULL CODE  Family Communication: No family present at bedside Disposition Plan: Anticipating D/C Home in the AM  Status is: Observation  The patient will require care spanning > 2 midnights and should be moved to inpatient because: Unsafe d/c plan, IV treatments appropriate due to intensity of illness or inability to take PO and Inpatient level of care appropriate due to severity of illness  Dispo: The patient is from: Home              Anticipated d/c is to: Home              Anticipated d/c date is: 1 day              Patient currently is not medically stable to d/c.  Consultants:  None    Procedures: None  Antimicrobials: Anti-infectives (From admission, onward)   None       Subjective: Seen and examined at bedside and he was doing okay today still not back to his baseline.  Still felt a little dyspneic.  Still coughing up some sputum.  No nausea or vomiting.  Denies any lightheadedness or dizziness.  Is yet to ambulate the halls yet.  No other concerns or complaints at this time and he denies any chest pain or any burning or discomfort in his urine.  Objective: Vitals:   01/04/20 1450 01/04/20 1820 01/04/20 1938 01/05/20 0612  BP: (!) 159/98 123/63  (!) 146/93  Pulse: (!) 108 (!) 107  82  Resp: 20   20  Temp: 97.8 F (36.6 C) 98 F (36.7 C)  97.7 F (36.5 C)  TempSrc: Oral Oral    SpO2: 93% (!) 89% 92% 98%  Weight:      Height:        Intake/Output Summary (Last 24 hours) at 01/05/2020 1215 Last data filed at 01/05/2020 0600 Gross per 24 hour  Intake 240 ml  Output --  Net 240 ml    Filed Weights   01/03/20 2004  Weight: (!) 154.2 kg   Examination: Physical Exam:  Constitutional: WN/WD morbidly obese Caucasian male currently in no acute distress appears calm and resting in the bed Eyes: Lids and conjunctivae normal, sclerae anicteric  ENMT: External Ears, Nose appear normal. Grossly normal hearing. Neck: Appears normal, supple, no cervical masses, normal ROM, no appreciable thyromegaly: No appreciable JVD but difficult to assess given his body habitus Respiratory: Diminished to auscultation bilaterally with coarse breath sounds and some wheezing.  No appreciable, rales, rhonchi or crackles. Normal respiratory effort and patient is not tachypenic. No accessory muscle use.  Currently wearing supplemental oxygen via nasal cannula Cardiovascular: RRR, no murmurs / rubs / gallops. S1 and S2 auscultated.  Trace extremity edema Abdomen: Soft, non-tender, distended secondary body habitus. Bowel sounds positive.  GU: Deferred. Musculoskeletal: No clubbing / cyanosis of digits/nails. No joint deformity upper and lower extremities.  Skin: No rashes, lesions, ulcers on limited skin evaluation. No induration; Warm and dry.  Neurologic: CN 2-12 grossly intact with no focal deficits. Romberg sign and cerebellar reflexes not assessed.  Psychiatric: Normal judgment and insight. Alert and oriented x 3. Normal mood and appropriate affect.   Data Reviewed: I have personally reviewed following labs and imaging studies  CBC: Recent Labs  Lab 01/03/20 2235 01/04/20 1645 01/05/20 0540  WBC 14.5* 14.6* 12.9*  NEUTROABS 8.7*  --   --   HGB 15.7 15.3 14.9  HCT 47.5 46.1 45.0  MCV 98.1 98.3 98.9  PLT 306 312 480   Basic Metabolic Panel: Recent Labs  Lab 01/03/20 2235 01/04/20 1645 01/05/20 0540  NA 135  --  137  K 4.4  --  4.3  CL 98  --  100  CO2 25  --  25  GLUCOSE 118*  --  165*  BUN 21  --  19  CREATININE 1.20 1.10 1.07  CALCIUM 9.9  --  9.1   GFR: Estimated  Creatinine Clearance: 103.7 mL/min (by C-G formula based on SCr of 1.07 mg/dL). Liver Function Tests: Recent Labs  Lab 01/03/20 2235 01/05/20 0540  AST 24 19  ALT 37 28  ALKPHOS 64 58  BILITOT 0.6 0.6  PROT 7.8 6.9  ALBUMIN 4.6 4.3   No results for input(s): LIPASE, AMYLASE in the last 168 hours. No results for input(s): AMMONIA in the last 168 hours. Coagulation Profile: No results for input(s): INR, PROTIME in the last 168 hours. Cardiac Enzymes: No results for input(s): CKTOTAL, CKMB, CKMBINDEX, TROPONINI in the last 168 hours. BNP (last 3 results) No results for input(s): PROBNP in the last 8760 hours. HbA1C: Recent Labs    01/04/20 1645  HGBA1C 7.5*   CBG: Recent Labs  Lab 01/04/20 1639 01/04/20 2033 01/05/20 0914 01/05/20 1130  GLUCAP 193* 217* 252* 268*   Lipid Profile: No results for input(s): CHOL, HDL, LDLCALC, TRIG, CHOLHDL, LDLDIRECT in the last 72 hours. Thyroid Function Tests: No results for input(s): TSH, T4TOTAL, FREET4, T3FREE, THYROIDAB in the last 72 hours. Anemia Panel: No results for input(s): VITAMINB12, FOLATE, FERRITIN, TIBC, IRON, RETICCTPCT in the last 72 hours. Sepsis Labs: No results for input(s): PROCALCITON, LATICACIDVEN in the last 168 hours.  Recent Results (from the past 240 hour(s))  Respiratory Panel by RT PCR (Flu A&B, Covid) - Nasopharyngeal Swab     Status: None   Collection Time: 01/03/20 11:49 PM   Specimen: Nasopharyngeal Swab  Result Value Ref Range Status   SARS Coronavirus 2 by RT PCR NEGATIVE NEGATIVE Final    Comment: (NOTE) SARS-CoV-2 target nucleic acids are NOT DETECTED.  The SARS-CoV-2 RNA is generally detectable in upper respiratoy specimens during the acute phase of infection. The lowest concentration of SARS-CoV-2 viral copies this assay can detect is 131 copies/mL. A negative result does not preclude SARS-Cov-2 infection and should not be used as the sole basis for treatment or other patient management  decisions. A negative result may occur with  improper specimen collection/handling, submission of specimen other than nasopharyngeal swab, presence of viral mutation(s) within the areas targeted by this assay, and inadequate number of viral copies (<131 copies/mL). A negative result must be combined with clinical observations, patient history, and epidemiological information. The expected result is Negative.  Fact Sheet for Patients:  PinkCheek.be  Fact Sheet for Healthcare Providers:  GravelBags.it  This test is no t yet approved or cleared by the [place] FDA and  has been authorized for detection and/or diagnosis of SARS-CoV-2 by FDA under an Emergency Use Authorization (EUA). This EUA will remain  in effect (meaning this test can be used) for the duration of the COVID-19 declaration under Section 564(b)(1) of the Act, 21 U.S.C. section 360bbb-3(b)(1), unless the authorization is terminated or revoked sooner.     Influenza A by PCR NEGATIVE NEGATIVE Final   Influenza B by PCR NEGATIVE NEGATIVE Final    Comment: (NOTE) The Xpert Xpress SARS-CoV-2/FLU/RSV assay is intended as an aid in  the diagnosis of influenza from Nasopharyngeal swab specimens and  should not be used as a sole basis for treatment. Nasal washings and  aspirates are unacceptable for Xpert Xpress SARS-CoV-2/FLU/RSV  testing.  Fact Sheet for Patients: PinkCheek.be  Fact Sheet for Healthcare Providers: GravelBags.it  This test is not yet approved or cleared by the [place] FDA and  has been authorized for detection and/or diagnosis of SARS-CoV-2 by  FDA under an Emergency Use Authorization (EUA). This EUA will remain  in effect (meaning this test can be used) for the duration of the  Covid-19 declaration under Section 564(b)(1) of the Act, 21  U.S.C. section 360bbb-3(b)(1), unless the  authorization is  terminated or revoked. Performed at [facility], [place] [address], [place], [place] [address]      RN Pressure Injury Documentation:     Estimated body mass index is 43.65 kg/m as calculated from the following:   Height as of this encounter: _0  (1.88 m).   Weight as of this encounter: 154.2 kg.  Malnutrition Type:   Malnutrition Characteristics:   Nutrition Interventions:   Radiology Studies: DG Chest 2 View  Result Date: 01/03/2020 CLINICAL DATA:  Shortness of breath EXAM: CHEST - 2 VIEW COMPARISON:  12/25/2019 FINDINGS: Cardiac shadow is stable. Mild scarring is noted in the left lung base stable from prior exam. Hyperinflation of the lungs is again seen and stable. No bony abnormality is noted. IMPRESSION: No acute abnormality noted. Electronically Signed   By: Inez Catalina M.D.   On: 01/03/2020 22:41   Scheduled Meds:  arformoterol  15 mcg Nebulization BID   vitamin C  500 mg Oral BID   buPROPion  300 mg Oral Daily   busPIRone  15 mg Oral Daily   calcium-vitamin D  1 tablet Oral BID WC   cholecalciferol  1,000 Units Oral Daily   cyclobenzaprine  10 mg Oral QHS   diltiazem  180 mg Oral Daily   docusate sodium  100 mg Oral Daily   DULoxetine  60 mg Oral BID WC   enoxaparin (LOVENOX) injection  80 mg Subcutaneous Q24H   fluticasone  2 spray Each Nare Daily   fluticasone furoate-vilanterol  1 puff Inhalation Daily   folic acid  1 mg Oral Daily   gabapentin  900 mg Oral QHS   guaiFENesin  600 mg Oral BID   insulin aspart  0-15 Units Subcutaneous TID WC   insulin aspart  0-5 Units Subcutaneous QHS   lurasidone  40 mg Oral QPC supper   melatonin  6 mg Oral QHS   methotrexate  15 mg Oral Weekly   mycophenolate  1,500 mg Oral BID   Oxcarbazepine  300 mg Oral BID   polyethylene glycol  17 g Oral Daily  polyvinyl alcohol  1 drop Both Eyes TID   prednisoLONE acetate  1 drop Both Eyes See admin instructions    predniSONE  40 mg Oral Q breakfast   tamsulosin  0.4 mg Oral Daily   traZODone  75 mg Oral QHS   Continuous Infusions:   LOS: 0 days   Kerney Elbe, DO [facility] PAGER is on AMION  If 7PM-7AM, please contact night-coverage [email or web address]

## 2020-01-05 NOTE — Progress Notes (Signed)
Patient did not want to wear CPAP tonight. RN placed patient on 2 liters oxygen.

## 2020-01-06 ENCOUNTER — Inpatient Hospital Stay (HOSPITAL_COMMUNITY): Payer: No Typology Code available for payment source

## 2020-01-06 LAB — CBC WITH DIFFERENTIAL/PLATELET
Abs Immature Granulocytes: 0.07 10*3/uL (ref 0.00–0.07)
Basophils Absolute: 0 10*3/uL (ref 0.0–0.1)
Basophils Relative: 0 %
Eosinophils Absolute: 0 10*3/uL (ref 0.0–0.5)
Eosinophils Relative: 0 %
HCT: 43.6 % (ref 39.0–52.0)
Hemoglobin: 14.3 g/dL (ref 13.0–17.0)
Immature Granulocytes: 0 %
Lymphocytes Relative: 10 %
Lymphs Abs: 1.7 10*3/uL (ref 0.7–4.0)
MCH: 32 pg (ref 26.0–34.0)
MCHC: 32.8 g/dL (ref 30.0–36.0)
MCV: 97.5 fL (ref 80.0–100.0)
Monocytes Absolute: 0.7 10*3/uL (ref 0.1–1.0)
Monocytes Relative: 4 %
Neutro Abs: 14.6 10*3/uL — ABNORMAL HIGH (ref 1.7–7.7)
Neutrophils Relative %: 86 %
Platelets: 289 10*3/uL (ref 150–400)
RBC: 4.47 MIL/uL (ref 4.22–5.81)
RDW: 13.6 % (ref 11.5–15.5)
WBC: 17.2 10*3/uL — ABNORMAL HIGH (ref 4.0–10.5)
nRBC: 0 % (ref 0.0–0.2)

## 2020-01-06 LAB — COMPREHENSIVE METABOLIC PANEL
ALT: 27 U/L (ref 0–44)
AST: 21 U/L (ref 15–41)
Albumin: 4 g/dL (ref 3.5–5.0)
Alkaline Phosphatase: 54 U/L (ref 38–126)
Anion gap: 11 (ref 5–15)
BUN: 25 mg/dL — ABNORMAL HIGH (ref 8–23)
CO2: 23 mmol/L (ref 22–32)
Calcium: 9 mg/dL (ref 8.9–10.3)
Chloride: 98 mmol/L (ref 98–111)
Creatinine, Ser: 1.03 mg/dL (ref 0.61–1.24)
GFR, Estimated: >60 mL/min (ref >60)
Glucose, Bld: 214 mg/dL — ABNORMAL HIGH (ref 70–99)
Potassium: 4.3 mmol/L (ref 3.5–5.1)
Sodium: 132 mmol/L — ABNORMAL LOW (ref 135–145)
Total Bilirubin: 0.7 mg/dL (ref 0.3–1.2)
Total Protein: 6.7 g/dL (ref 6.5–8.1)

## 2020-01-06 LAB — GLUCOSE, CAPILLARY
Glucose-Capillary: 235 mg/dL — ABNORMAL HIGH (ref 70–99)
Glucose-Capillary: 240 mg/dL — ABNORMAL HIGH (ref 70–99)

## 2020-01-06 LAB — PHOSPHORUS: Phosphorus: 3.8 mg/dL (ref 2.5–4.6)

## 2020-01-06 LAB — MAGNESIUM: Magnesium: 2 mg/dL (ref 1.7–2.4)

## 2020-01-06 MED ORDER — AZITHROMYCIN 500 MG PO TABS: 500.0000 mg | ORAL_TABLET | Freq: Every day | ORAL | 0 refills | Status: AC

## 2020-01-06 MED ORDER — PREDNISONE 10 MG (21) PO TBPK: ORAL_TABLET | ORAL | 0 refills | Status: DC

## 2020-01-06 MED ORDER — ONDANSETRON HCL 4 MG PO TABS: 4.0000 mg | ORAL_TABLET | Freq: Four times a day (QID) | ORAL | 0 refills | Status: DC | PRN

## 2020-01-06 NOTE — TOC Progression Note (Signed)
Transition of Care [facility]) - Progression Note    Patient Details  Name: Mario [name] MRN: [id number] Date of Birth: 1950-08-07  [facility] CM/SW Contact  Purcell Mouton, RN Phone Number: 01/06/2020, 12:36 PM  Clinical Narrative:      Spoke with pt concerning O2 to go home with. Pt states that he will not need O2 to go home.       Expected Discharge Plan and Services           Expected Discharge Date: 01/06/20                                     Social Determinants of Health (SDOH) Interventions    Readmission Risk Interventions No flowsheet data found.

## 2020-01-06 NOTE — Progress Notes (Signed)
SATURATION QUALIFICATIONS: (This note is used to comply with regulatory documentation for home oxygen)  Patient Saturations on Room Air at Rest = 91  Patient Saturations on Room Air while Ambulating = 88  Patient Saturations on 2 Liters of oxygen while Ambulating = 97  Please briefly explain why patient needs home oxygen: desat with consistant activity

## 2020-01-06 NOTE — Progress Notes (Signed)
Patient discharged from unit with his personal belongings via private vehicle. Discharge instructions and medications reviewed, patient denies any questions or concerns. IV removed from LFA site WDL. Tele removed. Patient confirms he has oxygen at home.

## 2020-01-06 NOTE — TOC Progression Note (Signed)
Transition of Care [facility]) - Progression Note    Patient Details  Name: Mario [name] MRN: [id number] Date of Birth: October 20, 1950  [facility]) CM/SW Contact  Purcell Mouton, RN Phone Number: 01/06/2020, 11:49 AM  Clinical Narrative:    [facility] and D/C'd today.         Expected Discharge Plan and Services           Expected Discharge Date: 01/06/20                                     Social Determinants of Health (SDOH) Interventions    Readmission Risk Interventions No flowsheet data found.

## 2020-01-06 NOTE — Progress Notes (Signed)
Physician Discharge Summary  Mario [name] NLZ:[id number] DOB: 05-15-1950 DOA: 01/03/2020  PCP: [facility]  Admit date: 01/03/2020 Discharge date: 01/06/2020  Admitted From: Home Disposition: Home  Recommendations for Outpatient Follow-up:  1. Follow up with PCP in 1-2 weeks 2. Follow up with Pulmonary within 1-2 weeks 3. Please obtain CMP/CBC, Mag, Phos in one week 4. Repeat CXR in 3-6 weeks 5. Please follow up on the following pending results:  Home Health: No  Equipment/Devices: None  Discharge Condition: Stable  CODE STATUS: FULL CODE Diet recommendation: Heart Healthy Diet   Brief/Interim Summary: HPI per Mario [name] on 01/04/20 Mario [name] a 69 y.o.malewith medical history significant ofHTN, COPD, chronic hypoxic respiratory failure w/ home O2 use. Presenting with 3 weeks of worsening dyspnea. Reports that over the past 3 weeks he's had a harder time catching his breath. Simple actions -- especially showering -- leaves him winded. He's been taking his normal COPD medications and having to use his rescue inhaler more often. He reports intermittent chest pressure like his regular angina during this time, but denies any other symptoms. After failing to turn the corner by yesterday, he decided it was time to come to the ED. He denies any other alleviating or aggravating factors. He denies any other treatments.  ED Course:CXR was negative of acute process. He was dountd to be hypoxic and tachypneic. EKG was performed that did not show STEMI. His trp were negative x 2 and his BNP was normal. He was given solumedrol and albuterol and began to feel better. TRH was called for admission.  Review of Systems:Denies fever, productive cough, sick contacts, lightheadedness, dizziness, N/V/D.Review of systems is otherwise negative for all not mentioned in HPI.   **Interim History Patient feels a little bit better but still not back to his baseline is still  felt somewhat dyspneic.  He thinks he is improving well.  Breathing treatments have been changed around and have added guaifenesin, flutter valve, incentive spirometry and added back IV steroids.  Anticipating discharging home in the a.m. after home O2 screen.  Patient significantly improved and was close to his baseline and was deemed stable for discharge.  PT had no further recommendations and he had saturations of 91% on room air and then desaturated down to 88% but was put back on his chronic 2 L.  He is stable for discharge at this time and will need to follow-up with PCP and pulmonologist and he will be discharged on p.o. antibiotics with azithromycin p.o. steroids and continue his COPD inhaler with Stiolto.  Discharge Diagnoses:  Active Problems:   COPD exacerbation (HCC)   Acute exacerbation of chronic obstructive pulmonary disease (COPD) (HCC)  Acute Exacerbation of COPD Acute on Chronic Respiratory Failure with Hypoxia -Admit to obs, telemetry but still not back to Baseline -Continue with as needed albuterol nebs 2.5 nebs every every 2 as needed for wheezing, started on Brovana 15 mcg neb twice daily as well as adding budesonide 0.25 mg neb twice daily.  We will discontinue his Adair Patter as patient takes Stiolto at home -Started Xopenex and Atrovent breathing treatments scheduled every 6 Continue with guaifenesin 1200 g p.o. twice daily, flutter valve, incentive spirometry -Continue supplemental oxygen via nasal cannula; wears 2 L of oxygen via nasal cannula nightly with his CPAP -Continue with fluticasone 2 sprays in each nare daily -He received IV Solu-Medrol 60 mg every 12 hours and then this was changed to 40 mg p.o. daily but will hold 40 p.o.  daily and continue with 60 every 12 and wean to 60 daily and sent home on a prednisone taper -Patient had a leukocytosis of 14.6 and this is now trended down to 12.9 and is now trending back up in the setting of steroid margination is  17.2 -We will start p.o. azithromycin for 5 days total and transition his Solu-Medrol to a Sterapred taper for 21 days -Chest x-ray showed "Cardiac shadow is stable. Mild scarring is noted in the left lung base stable from prior exam. Hyperinflation of the lungs is again seen and stable. No bony abnormality is noted." -Patient no longer dyspneic and back to his baseline and will resume his home Stiolto and follow-up with PCP as well as pulmonary outpatient setting and repeat a chest x-ray in 3 to 6 weeks  HTN -Continue Diltiazem 170 mg po Daily -Continue to Monitor BP per Protocol -Last BP was  121/66  DM2 -C/w Moderate Novolog SSI AC/HS -HbA1c was 7.5 -CBG's ranging from 204-240 -Continue to Monitor Blood Sugars carefully and adjust Insulin Regimen as Necessary given that he will likley have elevated blood sugar 2/2 to Steroid Demargination  -Follow-up with PCP in the outpatient setting  BPH -Continue Tamsulosin 0.4 mg p.o. 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   Weight as of this  encounter: 154.2 kg. -Weight loss and Dietary Counseling given   Discharge Instructions  Discharge Instructions    Call MD for:  difficulty breathing, headache or visual disturbances   Complete by: As directed    Call MD for:  extreme fatigue   Complete by: As directed    Call MD for:  hives   Complete by: As directed    Call MD for:  persistant dizziness or light-headedness   Complete by: As directed    Call MD for:  persistant nausea and vomiting   Complete by: As directed    Call MD for:  redness, tenderness, or signs of infection (pain, swelling, redness, odor or green/yellow discharge around incision site)   Complete by: As directed    Call MD for:  severe uncontrolled pain   Complete by: As directed    Call MD for:  temperature >100.4   Complete by: As directed    Diet - low sodium heart healthy   Complete by: As directed    Discharge instructions   Complete by: As directed    You were cared for by a hospitalist during your hospital stay. If you have any questions about your discharge medications or the care you received while you were in the hospital after you are  discharged, you can call the unit and ask to speak with the hospitalist on call if the hospitalist that took care of you is not available. Once you are discharged, your primary care physician will handle any further medical issues. Please note that NO REFILLS for any discharge medications will be authorized once you are discharged, as it is imperative that you return to your primary care physician (or establish a relationship with a primary care physician if you do not have one) for your aftercare needs so that they can reassess your need for medications and monitor your lab values.  Follow up with PCP and Pulmonary as an outpatient. Take all medications as prescribed. If symptoms change or worsen please return to the ED for evaluation   Increase activity slowly   Complete by: As directed      Allergies as of  01/06/2020      Reactions   Demerol [meperidine] Nausea And Vomiting, Other (See Comments)   Made the patient "violently sick"   Zocor [simvastatin] Nausea And Vomiting, Other (See Comments)   Made him very jittery, also   Beet [beta Vulgaris] Nausea And Vomiting   Liver Nausea And Vomiting      Medication List    TAKE these medications   azithromycin 500 MG tablet Commonly known as: Zithromax Take 1 tablet (500 mg total) by mouth daily for 5 days. Take 1 tablet daily for 3 days.   buPROPion 300 MG 24 hr tablet Commonly known as: WELLBUTRIN XL Take 300 mg by mouth daily.   busPIRone 15 MG tablet Commonly known as: BUSPAR Take 15 mg by mouth daily.   calcium-vitamin D 500-200 MG-UNIT tablet Commonly known as: OSCAL WITH D Take 1 tablet by mouth 2 (two) times daily with a meal.   cyclobenzaprine 10 MG tablet Commonly known as: FLEXERIL Take 10 mg by mouth at bedtime.   diltiazem 180 MG 24 hr capsule Commonly known as: CARDIZEM CD Take 180 mg by mouth daily.   docusate calcium 240 MG capsule Commonly known as: SURFAK Take 240 mg by mouth daily.   DULoxetine 60 MG capsule Commonly known as: CYMBALTA Take 1 capsule (60 mg total) by mouth 2 (two) times daily. What changed: when to take this   Enbrel 50 MG/ML injection Generic drug: etanercept Inject 50 mg into the skin once a week.   fluticasone 50 MCG/ACT nasal spray Commonly known as: FLONASE Place 2 sprays into both nostrils daily.   folic acid 1 MG tablet Commonly known as: FOLVITE Take 1 mg by mouth daily.   gabapentin 300 MG capsule Commonly known as: NEURONTIN Take 2 capsules (600 mg total) by mouth 2 (two) times daily. What changed:   how much to take  when to take this  additional instructions   guaifenesin 400 MG Tabs tablet Commonly known as: HUMIBID E Take 400 mg by mouth in the morning and at bedtime.   insulin glargine 100 UNIT/ML injection Commonly known as: LANTUS Inject 13 Units  into the skin at bedtime.   lisinopril 20 MG tablet Commonly known as: ZESTRIL Take 20 mg by mouth daily.   lurasidone 40 MG Tabs tablet Commonly known as: LATUDA Take 40 mg by mouth daily after supper.   melatonin 3 MG Tabs tablet Take 6 mg by mouth at bedtime.   metFORMIN 500 MG tablet Commonly known as: GLUCOPHAGE Take 500 mg by mouth in the morning and at bedtime.   methotrexate 2.5 MG tablet Commonly known as: RHEUMATREX  Take 6 tablets (15 mg total) by mouth every Saturday. Hold until seen by rheumatology  Caution:Chemotherapy. Protect from light. What changed:   when to take this  additional instructions   multivitamin with minerals Tabs tablet Take 1 tablet by mouth daily.   mycophenolate 250 MG capsule Commonly known as: CELLCEPT Take 1,500 mg by mouth 2 (two) times daily.   nitroGLYCERIN 0.4 MG SL tablet Commonly known as: NITROSTAT Place 1 tablet (0.4 mg total) under the tongue every 5 (five) minutes as needed for chest pain.   ondansetron 4 MG tablet Commonly known as: ZOFRAN Take 1 tablet (4 mg total) by mouth every 6 (six) hours as needed for nausea.   Oxcarbazepine 300 MG tablet Commonly known as: TRILEPTAL Take 450 mg by mouth 3 (three) times daily as needed (nerve pain).   OXYGEN Inhale 2 L/min into the lungs See admin instructions. 2 liters/min at bedtime with CPAP, during naps and all times of exertion   polyethylene glycol 17 g packet Commonly known as: MIRALAX / GLYCOLAX Take 17 g by mouth daily.   prednisoLONE acetate 1 % ophthalmic suspension Commonly known as: PRED FORTE Place 1 drop into both eyes See admin instructions. INSTILL 1 DROP IN LEFT EYE EVERY HOUR AND FOLLOW TAPER SCHEDULE FOR PRED FORTE FOR THE RIGHT EYE   predniSONE 10 MG (21) Tbpk tablet Commonly known as: STERAPRED UNI-PAK 21 TAB Take 6 pills on day 1, 5 pills on day 2, 4 pills on day 3, 3 pills on day 4, 2 pills on day 5, 1 pill on day 6, and then stop on day 7    PRESCRIPTION MEDICATION CPAP- At bedtime and during during any naps   sodium chloride 0.65 % Soln nasal spray Commonly known as: OCEAN Place 2 sprays into both nostrils 4 (four) times daily as needed for congestion.   Stiolto Respimat 2.5-2.5 MCG/ACT Aers Generic drug: Tiotropium Bromide-Olodaterol Inhale 2 puffs into the lungs daily.   Systane Balance 0.6 % Soln Generic drug: Propylene Glycol Place 1 drop into both eyes in the morning, at noon, and at bedtime.   tamsulosin 0.4 MG Caps capsule Commonly known as: FLOMAX Take 0.4 mg by mouth daily.   traMADol 50 MG tablet Commonly known as: ULTRAM Take 50 mg by mouth 2 (two) times daily.   traZODone 50 MG tablet Commonly known as: DESYREL Take 75 mg by mouth at bedtime.   vitamin C 500 MG tablet Commonly known as: ASCORBIC ACID Take 500 mg by mouth 2 (two) times daily.   Vitamin D-3 25 MCG (1000 UT) Caps Take 1,000 Units by mouth daily. IN ADDITION TO CALCIUM +D       Allergies  Allergen Reactions  . Demerol [Meperidine] Nausea And Vomiting and Other (See Comments)    Made the patient "violently sick"  . Zocor [Simvastatin] Nausea And Vomiting and Other (See Comments)    Made him very jittery, also  . Beet [Beta Vulgaris] Nausea And Vomiting  . Liver Nausea And Vomiting    Consultations:  None  Procedures/Studies: DG Chest 2 View  Result Date: 01/03/2020 CLINICAL DATA:  Shortness of breath EXAM: CHEST - 2 VIEW COMPARISON:  12/25/2019 FINDINGS: Cardiac shadow is stable. Mild scarring is noted in the left lung base stable from prior exam. Hyperinflation of the lungs is again seen and stable. No bony abnormality is noted. IMPRESSION: No acute abnormality noted. Electronically Signed   By: Inez Catalina M.D.   On: 01/03/2020 22:41  CT HEAD WO CONTRAST  Result Date: 12/25/2019 CLINICAL DATA:  Nonspecific dizziness.  Lightheadedness. EXAM: CT HEAD WITHOUT CONTRAST TECHNIQUE: Contiguous axial images were obtained  from the base of the skull through the vertex without intravenous contrast. COMPARISON:  Brain MRI 07/21/2014 FINDINGS: Brain: Mild generalized atrophy is similar to prior MRI. No intracranial hemorrhage, mass effect, or midline shift. No hydrocephalus. The basilar cisterns are patent. Moderate chronic small vessel ischemia. No evidence of territorial infarct or acute ischemia. No extra-axial or intracranial fluid collection. Vascular: Atherosclerosis of skullbase vasculature without hyperdense vessel or abnormal calcification. Skull: No acute findings. No fracture or destructive lesion. No definite CT findings to left skull base ovoid T2 hyperintensity on prior MRI. Sinuses/Orbits: Previous fluid in right mastoid air cells has resolved. Mastoid air cells are clear. Paranasal sinuses are clear. Right cataract resection. No acute orbital findings. Other: None. IMPRESSION: 1. No acute intracranial abnormality. 2. Unchanged atrophy and chronic small vessel ischemia. Electronically Signed   By: Keith Rake M.D.   On: 12/25/2019 19:54   CT Angio Chest PE W and/or Wo Contrast  Result Date: 12/26/2019 CLINICAL DATA:  PE suspected, high prob Shortness of breath.  Dizziness and lightheaded. EXAM: CT ANGIOGRAPHY CHEST WITH CONTRAST TECHNIQUE: Multidetector CT imaging of the chest was performed using the standard protocol during bolus administration of intravenous contrast. Multiplanar CT image reconstructions and MIPs were obtained to evaluate the vascular anatomy. CONTRAST:  150m OMNIPAQUE IOHEXOL 350 MG/ML SOLN COMPARISON:  Radiograph yesterday.  Chest CTA 09/17/2016 FINDINGS: Cardiovascular: There are no filling defects within the pulmonary arteries to suggest pulmonary embolus. Basilar assessment is partially obscured by breathing motion artifact. Thoracic aorta is normal in caliber. No aortic dissection. There is irregular calcified and noncalcified plaque involving the descending thoracic aorta. No evidence  of vasculitis or penetrating ulcer. Upper normal heart size. No pericardial effusion. Coronary artery calcifications are seen. Again seen variant venous drainage on intercostal vein into the brachiocephalic, unchanged. Mediastinum/Nodes: Multiple small mediastinal lymph nodes not enlarged by size criteria. No hilar adenopathy. No esophageal wall thickening. No suspicious thyroid nodule. Lungs/Pleura: Moderately advanced emphysema. There is a thick-walled cavitary process in the periphery of the left lower lobe, series 13, image 72. This measures 3.8 cm on the current exam, increased in size from 2 cm previously. Subpleural bleb in the right lower lobe has increased in size from 2018 with adjacent chronic atelectasis/scarring. Mild chronic scarring in the lingula. Mild elevation of the right hemidiaphragm with adjacent compressive atelectasis. No pleural fluid. No pneumothorax. Upper Abdomen: Prominent size liver with steatosis, partially included. No acute findings. Musculoskeletal: Flowing syndesmophytes throughout the thoracic spine. There are no acute or suspicious osseous abnormalities. Review of the MIP images confirms the above findings. IMPRESSION: 1. No pulmonary embolus. 2. Moderately advanced emphysema. Thick-walled cavitary process in the periphery of the left lower lobe has increased in size from 2018 CT, currently 3.8 cm, previously 2 cm. This may represent an area of chronic scarring given prior chest tube placement in this region, however increased size is worrisome for malignancy or potentially superimposed infectious process. Recommend either short-term follow-up CT after course of treatment or further characterization with PET CT. 3. Subpleural bleb in the right lower lobe has increased in size from 2018 with adjacent compressive atelectasis/scarring. Aortic Atherosclerosis (ICD10-I70.0) and Emphysema (ICD10-J43.9). Electronically Signed   By: MKeith RakeM.D.   On: 12/26/2019 02:28   MR  ANGIO HEAD WO CONTRAST  Result Date: 12/26/2019 CLINICAL DATA:  Dizziness  EXAM: MRI HEAD WITHOUT CONTRAST MRA HEAD WITHOUT CONTRAST TECHNIQUE: Multiplanar, multiecho pulse sequences of the brain and surrounding structures were obtained without intravenous contrast. Angiographic images of the head were obtained using MRA technique without contrast. COMPARISON:  CT head 12/25/2019. FINDINGS: MRI HEAD FINDINGS Brain: No acute infarction, hemorrhage, hydrocephalus, extra-axial collection or mass lesion. Mild generalized volume loss with ex vacuo ventricular dilation. Scattered T2/FLAIR hyperintensities within the white matter, compatible with chronic microvascular ischemic disease. Vascular: Major arterial flow voids are maintained at the skull base. Skull and upper cervical spine: Normal marrow signal. Sinuses/Orbits: Sinuses are clear.  Unremarkable orbits. Other: No mastoid effusions. Small amount of suspected trapped fluid at the left petrous apex. MRA HEAD FINDINGS Anterior circulation: Limited evaluation of the ICAs proximal to the cavernous segment secondary to artifact. Within this limitation, no evidence of hemodynamically significant proximal stenosis or large vessel occlusion. No aneurysm identified. Posterior circulation: No evidence of hemodynamically significant stenosis or large vessel occlusion. Mild irregularity of the basilar artery and bilateral posterior cerebral arteries, likely related to atherosclerosis. Flow related signal identified bilateral posteroinferior cerebral arteries. IMPRESSION: 1. No acute intracranial abnormality. Specifically, no acute infarct. 2. No evidence of hemodynamically significant stenosis or large vessel occlusion, within limitations detailed above. 3. Generalized cerebral volume loss. Electronically Signed   By: Margaretha Sheffield MD   On: 12/26/2019 14:49   MR BRAIN WO CONTRAST  Result Date: 12/26/2019 CLINICAL DATA:  Dizziness EXAM: MRI HEAD WITHOUT CONTRAST MRA  HEAD WITHOUT CONTRAST TECHNIQUE: Multiplanar, multiecho pulse sequences of the brain and surrounding structures were obtained without intravenous contrast. Angiographic images of the head were obtained using MRA technique without contrast. COMPARISON:  CT head 12/25/2019. FINDINGS: MRI HEAD FINDINGS Brain: No acute infarction, hemorrhage, hydrocephalus, extra-axial collection or mass lesion. Mild generalized volume loss with ex vacuo ventricular dilation. Scattered T2/FLAIR hyperintensities within the white matter, compatible with chronic microvascular ischemic disease. Vascular: Major arterial flow voids are maintained at the skull base. Skull and upper cervical spine: Normal marrow signal. Sinuses/Orbits: Sinuses are clear.  Unremarkable orbits. Other: No mastoid effusions. Small amount of suspected trapped fluid at the left petrous apex. MRA HEAD FINDINGS Anterior circulation: Limited evaluation of the ICAs proximal to the cavernous segment secondary to artifact. Within this limitation, no evidence of hemodynamically significant proximal stenosis or large vessel occlusion. No aneurysm identified. Posterior circulation: No evidence of hemodynamically significant stenosis or large vessel occlusion. Mild irregularity of the basilar artery and bilateral posterior cerebral arteries, likely related to atherosclerosis. Flow related signal identified bilateral posteroinferior cerebral arteries. IMPRESSION: 1. No acute intracranial abnormality. Specifically, no acute infarct. 2. No evidence of hemodynamically significant stenosis or large vessel occlusion, within limitations detailed above. 3. Generalized cerebral volume loss. Electronically Signed   By: Margaretha Sheffield MD   On: 12/26/2019 14:49   DG CHEST PORT 1 VIEW  Result Date: 01/06/2020 CLINICAL DATA:  Shortness of breath EXAM: PORTABLE CHEST 1 VIEW COMPARISON:  Three days ago FINDINGS: Low volume chest with streaky density at the bases. No Kerley lines,  effusion, or pneumothorax. Normal heart size for technique. IMPRESSION: Lower volumes and increased atelectasis since 3 days ago. Electronically Signed   By: Monte Fantasia M.D.   On: 01/06/2020 07:39   DG Chest Portable 1 View  Result Date: 12/25/2019 CLINICAL DATA:  Shortness of breath EXAM: PORTABLE CHEST 1 VIEW COMPARISON:  07/11/2019 FINDINGS: The heart size and mediastinal contours are within normal limits. Scarring at the left lung base. The visualized  skeletal structures are unremarkable. IMPRESSION: No active disease. Electronically Signed   By: Ulyses Jarred M.D.   On: 12/25/2019 22:22     Subjective: Seen and examined and felt close to his baseline and felt well.  Denied chest pain, lightheadedness or dizziness.  Rested and ambulated without issues.  No nausea or vomiting.  Denies any other concerns at this time.  He is ready to be discharged home.  Discharge Exam: Vitals:   01/06/20 0800 01/06/20 0836  BP:    Pulse:    Resp:    Temp:    SpO2: 96% 94%   Vitals:   01/05/20 2156 01/06/20 0515 01/06/20 0800 01/06/20 0836  BP: 138/90 121/66    Pulse: 96 90    Resp: 18 16    Temp: 97.6 F (36.4 C) (!) 97.4 F (36.3 C)    TempSrc: Oral Oral    SpO2: 93% 93% 96% 94%  Weight:      Height:       General: Pt is alert, awake, not in acute distress Cardiovascular: RRR, S1/S2 +, no rubs, no gallops Respiratory: Diminished bilaterally with minimal wheezing but no appreciable rhonchi; unlabored breathing is wearing 2 L supplemental oxygen via nasal cannula Abdominal: Soft, NT, distended secondary body habitus, bowel sounds + Extremities: Trace edema, no cyanosis  The results of significant diagnostics from this hospitalization (including imaging, microbiology, ancillary and laboratory) are listed below for reference.    Microbiology: Recent Results (from the past 240 hour(s))  Respiratory Panel by RT PCR (Flu A&B, Covid) - Nasopharyngeal Swab     Status: None   Collection  Time: 01/03/20 11:49 PM   Specimen: Nasopharyngeal Swab  Result Value Ref Range Status   SARS Coronavirus 2 by RT PCR NEGATIVE NEGATIVE Final    Comment: (NOTE) SARS-CoV-2 target nucleic acids are NOT DETECTED.  The SARS-CoV-2 RNA is generally detectable in upper respiratoy specimens during the acute phase of infection. The lowest concentration of SARS-CoV-2 viral copies this assay can detect is 131 copies/mL. A negative result does not preclude SARS-Cov-2 infection and should not be used as the sole basis for treatment or other patient management decisions. A negative result may occur with  improper specimen collection/handling, submission of specimen other than nasopharyngeal swab, presence of viral mutation(s) within the areas targeted by this assay, and inadequate number of viral copies (<131 copies/mL). A negative result must be combined with clinical observations, patient history, and epidemiological information. The expected result is Negative.  Fact Sheet for Patients:  PinkCheek.be  Fact Sheet for Healthcare Providers:  GravelBags.it  This test is no t yet approved or cleared by the [place] FDA and  has been authorized for detection and/or diagnosis of SARS-CoV-2 by FDA under an Emergency Use Authorization (EUA). This EUA will remain  in effect (meaning this test can be used) for the duration of the COVID-19 declaration under Section 564(b)(1) of the Act, 21 U.S.C. section 360bbb-3(b)(1), unless the authorization is terminated or revoked sooner.     Influenza A by PCR NEGATIVE NEGATIVE Final   Influenza B by PCR NEGATIVE NEGATIVE Final    Comment: (NOTE) The Xpert Xpress SARS-CoV-2/FLU/RSV assay is intended as an aid in  the diagnosis of influenza from Nasopharyngeal swab specimens and  should not be used as a sole basis for treatment. Nasal washings and  aspirates are unacceptable for Xpert Xpress  SARS-CoV-2/FLU/RSV  testing.  Fact Sheet for Patients: PinkCheek.be  Fact Sheet for Healthcare Providers: GravelBags.it  This  test is not yet approved or cleared by the [place] and  has been authorized for detection and/or diagnosis of SARS-CoV-2 by  FDA under an Emergency Use Authorization (EUA). This EUA will remain  in effect (meaning this test can be used) for the duration of the  Covid-19 declaration under Section 564(b)(1) of the Act, 21  U.S.C. section 360bbb-3(b)(1), unless the authorization is  terminated or revoked. Performed at [facility], [place] [address], [place], [place] [address]    Labs: BNP (last 3 results) Recent Labs    12/25/19 1856 01/03/20 2235  BNP 27.3 56.3   Basic Metabolic Panel: Recent Labs  Lab 01/03/20 2235 01/04/20 1645 01/05/20 0540 01/06/20 0446  NA 135  --  137 132*  K 4.4  --  4.3 4.3  CL 98  --  100 98  CO2 25  --  25 23  GLUCOSE 118*  --  165* 214*  BUN 21  --  19 25*  CREATININE 1.20 1.10 1.07 1.03  CALCIUM 9.9  --  9.1 9.0  MG  --   --   --  2.0  PHOS  --   --   --  3.8   Liver Function Tests: Recent Labs  Lab 01/03/20 2235 01/05/20 0540 01/06/20 0446  AST $Re'24 19 21  'fgL$ ALT 37 28 27  ALKPHOS 64 58 54  BILITOT 0.6 0.6 0.7  PROT 7.8 6.9 6.7  ALBUMIN 4.6 4.3 4.0   No results for input(s): LIPASE, AMYLASE in the last 168 hours. No results for input(s): AMMONIA in the last 168 hours. CBC: Recent Labs  Lab 01/03/20 2235 01/04/20 1645 01/05/20 0540 01/06/20 0446  WBC 14.5* 14.6* 12.9* 17.2*  NEUTROABS 8.7*  --   --  14.6*  HGB 15.7 15.3 14.9 14.3  HCT 47.5 46.1 45.0 43.6  MCV 98.1 98.3 98.9 97.5  PLT 306 312 278 289   Cardiac Enzymes: No results for input(s): CKTOTAL, CKMB, CKMBINDEX, TROPONINI in the last 168 hours. BNP: Invalid input(s): POCBNP CBG: Recent Labs  Lab 01/05/20 1130 01/05/20 1643 01/05/20 2158  01/06/20 0744 01/06/20 1138  GLUCAP 268* 204* 240* 235* 240*   D-Dimer Recent Labs    01/03/20 2235  DDIMER <0.27   Hgb A1c Recent Labs    01/04/20 1645  HGBA1C 7.5*   Lipid Profile No results for input(s): CHOL, HDL, LDLCALC, TRIG, CHOLHDL, LDLDIRECT in the last 72 hours. Thyroid function studies No results for input(s): TSH, T4TOTAL, T3FREE, THYROIDAB in the last 72 hours.  Invalid input(s): FREET3 Anemia work up No results for input(s): VITAMINB12, FOLATE, FERRITIN, TIBC, IRON, RETICCTPCT in the last 72 hours. Urinalysis    Component Value Date/Time   COLORURINE YELLOW 12/25/2019 2238   APPEARANCEUR CLEAR 12/25/2019 2238   LABSPEC 1.025 12/25/2019 2238   PHURINE 5.0 12/25/2019 2238   GLUCOSEU NEGATIVE 12/25/2019 2238   HGBUR NEGATIVE 12/25/2019 2238   BILIRUBINUR NEGATIVE 12/25/2019 2238   KETONESUR 5 (A) 12/25/2019 2238   PROTEINUR NEGATIVE 12/25/2019 2238   UROBILINOGEN 1.0 01/12/2013 0024   NITRITE NEGATIVE 12/25/2019 2238   LEUKOCYTESUR NEGATIVE 12/25/2019 2238   Sepsis Labs Invalid input(s): PROCALCITONIN,  WBC,  LACTICIDVEN Microbiology Recent Results (from the past 240 hour(s))  Respiratory Panel by RT PCR (Flu A&B, Covid) - Nasopharyngeal Swab     Status: None   Collection Time: 01/03/20 11:49 PM   Specimen: Nasopharyngeal Swab  Result Value Ref Range Status   SARS Coronavirus 2 by RT  PCR NEGATIVE NEGATIVE Final    Comment: (NOTE) SARS-CoV-2 target nucleic acids are NOT DETECTED.  The SARS-CoV-2 RNA is generally detectable in upper respiratoy specimens during the acute phase of infection. The lowest concentration of SARS-CoV-2 viral copies this assay can detect is 131 copies/mL. A negative result does not preclude SARS-Cov-2 infection and should not be used as the sole basis for treatment or other patient management decisions. A negative result may occur with  improper specimen collection/handling, submission of specimen other than nasopharyngeal  swab, presence of viral mutation(s) within the areas targeted by this assay, and inadequate number of viral copies (<131 copies/mL). A negative result must be combined with clinical observations, patient history, and epidemiological information. The expected result is Negative.  Fact Sheet for Patients:  PinkCheek.be  Fact Sheet for Healthcare Providers:  GravelBags.it  This test is no t yet approved or cleared by the [place] FDA and  has been authorized for detection and/or diagnosis of SARS-CoV-2 by FDA under an Emergency Use Authorization (EUA). This EUA will remain  in effect (meaning this test can be used) for the duration of the COVID-19 declaration under Section 564(b)(1) of the Act, 21 U.S.C. section 360bbb-3(b)(1), unless the authorization is terminated or revoked sooner.     Influenza A by PCR NEGATIVE NEGATIVE Final   Influenza B by PCR NEGATIVE NEGATIVE Final    Comment: (NOTE) The Xpert Xpress SARS-CoV-2/FLU/RSV assay is intended as an aid in  the diagnosis of influenza from Nasopharyngeal swab specimens and  should not be used as a sole basis for treatment. Nasal washings and  aspirates are unacceptable for Xpert Xpress SARS-CoV-2/FLU/RSV  testing.  Fact Sheet for Patients: PinkCheek.be  Fact Sheet for Healthcare Providers: GravelBags.it  This test is not yet approved or cleared by the [place] FDA and  has been authorized for detection and/or diagnosis of SARS-CoV-2 by  FDA under an Emergency Use Authorization (EUA). This EUA will remain  in effect (meaning this test can be used) for the duration of the  Covid-19 declaration under Section 564(b)(1) of the Act, 21  U.S.C. section 360bbb-3(b)(1), unless the authorization is  terminated or revoked. Performed at [facility], [place] [address], [place], [place] [address]     Time coordinating discharge: 35 minutes  SIGNED:  Kerney Elbe, DO [facility] 01/06/2020, 6:43 PM Pager is on [place]  If 7PM-7AM, please contact night-coverage [email or web address]

## 2020-01-06 NOTE — Progress Notes (Signed)
PT Cancellation Note  Patient Details Name: Mario [name] MRN: [id number] DOB: [birth year]   Cancelled Treatment:    Reason Eval/Treat Not Completed: PT screened, no needs identified, will sign off. Pt reports he amb with nusrsing staff, is at his baseline and they checked amb O2 sats    [facility] 01/06/2020, 11:39 AM

## 2020-01-06 NOTE — Discharge Summary (Signed)
Physician Discharge Summary  Mario [name] NLZ:[id number] DOB: 05-15-1950 DOA: 01/03/2020  PCP: [facility]  Admit date: 01/03/2020 Discharge date: 01/06/2020  Admitted From: Home Disposition: Home  Recommendations for Outpatient Follow-up:  1. Follow up with PCP in 1-2 weeks 2. Follow up with Pulmonary within 1-2 weeks 3. Please obtain CMP/CBC, Mag, Phos in one week 4. Repeat CXR in 3-6 weeks 5. Please follow up on the following pending results:  Home Health: No  Equipment/Devices: None  Discharge Condition: Stable  CODE STATUS: FULL CODE Diet recommendation: Heart Healthy Diet   Brief/Interim Summary: HPI per Dr. Cherylann Ratel on 01/04/20 Mario [name] a 69 y.o.malewith medical history significant ofHTN, COPD, chronic hypoxic respiratory failure w/ home O2 use. Presenting with 3 weeks of worsening dyspnea. Reports that over the past 3 weeks he's had a harder time catching his breath. Simple actions -- especially showering -- leaves him winded. He's been taking his normal COPD medications and having to use his rescue inhaler more often. He reports intermittent chest pressure like his regular angina during this time, but denies any other symptoms. After failing to turn the corner by yesterday, he decided it was time to come to the ED. He denies any other alleviating or aggravating factors. He denies any other treatments.  ED Course:CXR was negative of acute process. He was dountd to be hypoxic and tachypneic. EKG was performed that did not show STEMI. His trp were negative x 2 and his BNP was normal. He was given solumedrol and albuterol and began to feel better. TRH was called for admission.  Review of Systems:Denies fever, productive cough, sick contacts, lightheadedness, dizziness, N/V/D.Review of systems is otherwise negative for all not mentioned in HPI.   **Interim History Patient feels a little bit better but still not back to his baseline is still  felt somewhat dyspneic.  He thinks he is improving well.  Breathing treatments have been changed around and have added guaifenesin, flutter valve, incentive spirometry and added back IV steroids.  Anticipating discharging home in the a.m. after home O2 screen.  Patient significantly improved and was close to his baseline and was deemed stable for discharge.  PT had no further recommendations and he had saturations of 91% on room air and then desaturated down to 88% but was put back on his chronic 2 L.  He is stable for discharge at this time and will need to follow-up with PCP and pulmonologist and he will be discharged on p.o. antibiotics with azithromycin p.o. steroids and continue his COPD inhaler with Stiolto.  Discharge Diagnoses:  Active Problems:   COPD exacerbation (HCC)   Acute exacerbation of chronic obstructive pulmonary disease (COPD) (HCC)  Acute Exacerbation of COPD Acute on Chronic Respiratory Failure with Hypoxia -Admit to obs, telemetry but still not back to Baseline -Continue with as needed albuterol nebs 2.5 nebs every every 2 as needed for wheezing, started on Brovana 15 mcg neb twice daily as well as adding budesonide 0.25 mg neb twice daily.  We will discontinue his Adair Patter as patient takes Stiolto at home -Started Xopenex and Atrovent breathing treatments scheduled every 6 Continue with guaifenesin 1200 g p.o. twice daily, flutter valve, incentive spirometry -Continue supplemental oxygen via nasal cannula; wears 2 L of oxygen via nasal cannula nightly with his CPAP -Continue with fluticasone 2 sprays in each nare daily -He received IV Solu-Medrol 60 mg every 12 hours and then this was changed to 40 mg p.o. daily but will hold 40 p.o.  daily and continue with 60 every 12 and wean to 60 daily and sent home on a prednisone taper -Patient had a leukocytosis of 14.6 and this is now trended down to 12.9 and is now trending back up in the setting of steroid margination is  17.2 -We will start p.o. azithromycin for 5 days total and transition his Solu-Medrol to a Sterapred taper for 21 days -Chest x-ray showed "Cardiac shadow is stable. Mild scarring is noted in the left lung base stable from prior exam. Hyperinflation of the lungs is again seen and stable. No bony abnormality is noted." -Patient no longer dyspneic and back to his baseline and will resume his home Stiolto and follow-up with PCP as well as pulmonary outpatient setting and repeat a chest x-ray in 3 to 6 weeks  HTN -Continue Diltiazem 170 mg po Daily -Continue to Monitor BP per Protocol -Last BP was  121/66  DM2 -C/w Moderate Novolog SSI AC/HS -HbA1c was 7.5 -CBG's ranging from 204-240 -Continue to Monitor Blood Sugars carefully and adjust Insulin Regimen as Necessary given that he will likley have elevated blood sugar 2/2 to Steroid Demargination  -Follow-up with PCP in the outpatient setting  BPH -Continue Tamsulosin 0.4 mg p.o. 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   Weight as of this  encounter: 154.2 kg. -Weight loss and Dietary Counseling given   Discharge Instructions  Discharge Instructions    Call MD for:  difficulty breathing, headache or visual disturbances   Complete by: As directed    Call MD for:  extreme fatigue   Complete by: As directed    Call MD for:  hives   Complete by: As directed    Call MD for:  persistant dizziness or light-headedness   Complete by: As directed    Call MD for:  persistant nausea and vomiting   Complete by: As directed    Call MD for:  redness, tenderness, or signs of infection (pain, swelling, redness, odor or green/yellow discharge around incision site)   Complete by: As directed    Call MD for:  severe uncontrolled pain   Complete by: As directed    Call MD for:  temperature >100.4   Complete by: As directed    Diet - low sodium heart healthy   Complete by: As directed    Discharge instructions   Complete by: As directed    You were cared for by a hospitalist during your hospital stay. If you have any questions about your discharge medications or the care you received while you were in the hospital after you are  discharged, you can call the unit and ask to speak with the hospitalist on call if the hospitalist that took care of you is not available. Once you are discharged, your primary care physician will handle any further medical issues. Please note that NO REFILLS for any discharge medications will be authorized once you are discharged, as it is imperative that you return to your primary care physician (or establish a relationship with a primary care physician if you do not have one) for your aftercare needs so that they can reassess your need for medications and monitor your lab values.  Follow up with PCP and Pulmonary as an outpatient. Take all medications as prescribed. If symptoms change or worsen please return to the ED for evaluation   Increase activity slowly   Complete by: As directed      Allergies as of  01/06/2020      Reactions   Demerol [meperidine] Nausea And Vomiting, Other (See Comments)   Made the patient "violently sick"   Zocor [simvastatin] Nausea And Vomiting, Other (See Comments)   Made him very jittery, also   Beet [beta Vulgaris] Nausea And Vomiting   Liver Nausea And Vomiting      Medication List    TAKE these medications   azithromycin 500 MG tablet Commonly known as: Zithromax Take 1 tablet (500 mg total) by mouth daily for 5 days. Take 1 tablet daily for 3 days.   buPROPion 300 MG 24 hr tablet Commonly known as: WELLBUTRIN XL Take 300 mg by mouth daily.   busPIRone 15 MG tablet Commonly known as: BUSPAR Take 15 mg by mouth daily.   calcium-vitamin D 500-200 MG-UNIT tablet Commonly known as: OSCAL WITH D Take 1 tablet by mouth 2 (two) times daily with a meal.   cyclobenzaprine 10 MG tablet Commonly known as: FLEXERIL Take 10 mg by mouth at bedtime.   diltiazem 180 MG 24 hr capsule Commonly known as: CARDIZEM CD Take 180 mg by mouth daily.   docusate calcium 240 MG capsule Commonly known as: SURFAK Take 240 mg by mouth daily.   DULoxetine 60 MG capsule Commonly known as: CYMBALTA Take 1 capsule (60 mg total) by mouth 2 (two) times daily. What changed: when to take this   Enbrel 50 MG/ML injection Generic drug: etanercept Inject 50 mg into the skin once a week.   fluticasone 50 MCG/ACT nasal spray Commonly known as: FLONASE Place 2 sprays into both nostrils daily.   folic acid 1 MG tablet Commonly known as: FOLVITE Take 1 mg by mouth daily.   gabapentin 300 MG capsule Commonly known as: NEURONTIN Take 2 capsules (600 mg total) by mouth 2 (two) times daily. What changed:   how much to take  when to take this  additional instructions   guaifenesin 400 MG Tabs tablet Commonly known as: HUMIBID E Take 400 mg by mouth in the morning and at bedtime.   insulin glargine 100 UNIT/ML injection Commonly known as: LANTUS Inject 13 Units  into the skin at bedtime.   lisinopril 20 MG tablet Commonly known as: ZESTRIL Take 20 mg by mouth daily.   lurasidone 40 MG Tabs tablet Commonly known as: LATUDA Take 40 mg by mouth daily after supper.   melatonin 3 MG Tabs tablet Take 6 mg by mouth at bedtime.   metFORMIN 500 MG tablet Commonly known as: GLUCOPHAGE Take 500 mg by mouth in the morning and at bedtime.   methotrexate 2.5 MG tablet Commonly known as: RHEUMATREX  Take 6 tablets (15 mg total) by mouth every Saturday. Hold until seen by rheumatology  Caution:Chemotherapy. Protect from light. What changed:   when to take this  additional instructions   multivitamin with minerals Tabs tablet Take 1 tablet by mouth daily.   mycophenolate 250 MG capsule Commonly known as: CELLCEPT Take 1,500 mg by mouth 2 (two) times daily.   nitroGLYCERIN 0.4 MG SL tablet Commonly known as: NITROSTAT Place 1 tablet (0.4 mg total) under the tongue every 5 (five) minutes as needed for chest pain.   ondansetron 4 MG tablet Commonly known as: ZOFRAN Take 1 tablet (4 mg total) by mouth every 6 (six) hours as needed for nausea.   Oxcarbazepine 300 MG tablet Commonly known as: TRILEPTAL Take 450 mg by mouth 3 (three) times daily as needed (nerve pain).   OXYGEN Inhale 2 L/min into the lungs See admin instructions. 2 liters/min at bedtime with CPAP, during naps and all times of exertion   polyethylene glycol 17 g packet Commonly known as: MIRALAX / GLYCOLAX Take 17 g by mouth daily.   prednisoLONE acetate 1 % ophthalmic suspension Commonly known as: PRED FORTE Place 1 drop into both eyes See admin instructions. INSTILL 1 DROP IN LEFT EYE EVERY HOUR AND FOLLOW TAPER SCHEDULE FOR PRED FORTE FOR THE RIGHT EYE   predniSONE 10 MG (21) Tbpk tablet Commonly known as: STERAPRED UNI-PAK 21 TAB Take 6 pills on day 1, 5 pills on day 2, 4 pills on day 3, 3 pills on day 4, 2 pills on day 5, 1 pill on day 6, and then stop on day 7    PRESCRIPTION MEDICATION CPAP- At bedtime and during during any naps   sodium chloride 0.65 % Soln nasal spray Commonly known as: OCEAN Place 2 sprays into both nostrils 4 (four) times daily as needed for congestion.   Stiolto Respimat 2.5-2.5 MCG/ACT Aers Generic drug: Tiotropium Bromide-Olodaterol Inhale 2 puffs into the lungs daily.   Systane Balance 0.6 % Soln Generic drug: Propylene Glycol Place 1 drop into both eyes in the morning, at noon, and at bedtime.   tamsulosin 0.4 MG Caps capsule Commonly known as: FLOMAX Take 0.4 mg by mouth daily.   traMADol 50 MG tablet Commonly known as: ULTRAM Take 50 mg by mouth 2 (two) times daily.   traZODone 50 MG tablet Commonly known as: DESYREL Take 75 mg by mouth at bedtime.   vitamin C 500 MG tablet Commonly known as: ASCORBIC ACID Take 500 mg by mouth 2 (two) times daily.   Vitamin D-3 25 MCG (1000 UT) Caps Take 1,000 Units by mouth daily. IN ADDITION TO CALCIUM +D       Allergies  Allergen Reactions  . Demerol [Meperidine] Nausea And Vomiting and Other (See Comments)    Made the patient "violently sick"  . Zocor [Simvastatin] Nausea And Vomiting and Other (See Comments)    Made him very jittery, also  . Beet [Beta Vulgaris] Nausea And Vomiting  . Liver Nausea And Vomiting    Consultations:  None  Procedures/Studies: DG Chest 2 View  Result Date: 01/03/2020 CLINICAL DATA:  Shortness of breath EXAM: CHEST - 2 VIEW COMPARISON:  12/25/2019 FINDINGS: Cardiac shadow is stable. Mild scarring is noted in the left lung base stable from prior exam. Hyperinflation of the lungs is again seen and stable. No bony abnormality is noted. IMPRESSION: No acute abnormality noted. Electronically Signed   By: Inez Catalina M.D.   On: 01/03/2020 22:41  CT HEAD WO CONTRAST  Result Date: 12/25/2019 CLINICAL DATA:  Nonspecific dizziness.  Lightheadedness. EXAM: CT HEAD WITHOUT CONTRAST TECHNIQUE: Contiguous axial images were obtained  from the base of the skull through the vertex without intravenous contrast. COMPARISON:  Brain MRI 07/21/2014 FINDINGS: Brain: Mild generalized atrophy is similar to prior MRI. No intracranial hemorrhage, mass effect, or midline shift. No hydrocephalus. The basilar cisterns are patent. Moderate chronic small vessel ischemia. No evidence of territorial infarct or acute ischemia. No extra-axial or intracranial fluid collection. Vascular: Atherosclerosis of skullbase vasculature without hyperdense vessel or abnormal calcification. Skull: No acute findings. No fracture or destructive lesion. No definite CT findings to left skull base ovoid T2 hyperintensity on prior MRI. Sinuses/Orbits: Previous fluid in right mastoid air cells has resolved. Mastoid air cells are clear. Paranasal sinuses are clear. Right cataract resection. No acute orbital findings. Other: None. IMPRESSION: 1. No acute intracranial abnormality. 2. Unchanged atrophy and chronic small vessel ischemia. Electronically Signed   By: Keith Rake M.D.   On: 12/25/2019 19:54   CT Angio Chest PE W and/or Wo Contrast  Result Date: 12/26/2019 CLINICAL DATA:  PE suspected, high prob Shortness of breath.  Dizziness and lightheaded. EXAM: CT ANGIOGRAPHY CHEST WITH CONTRAST TECHNIQUE: Multidetector CT imaging of the chest was performed using the standard protocol during bolus administration of intravenous contrast. Multiplanar CT image reconstructions and MIPs were obtained to evaluate the vascular anatomy. CONTRAST:  150m OMNIPAQUE IOHEXOL 350 MG/ML SOLN COMPARISON:  Radiograph yesterday.  Chest CTA 09/17/2016 FINDINGS: Cardiovascular: There are no filling defects within the pulmonary arteries to suggest pulmonary embolus. Basilar assessment is partially obscured by breathing motion artifact. Thoracic aorta is normal in caliber. No aortic dissection. There is irregular calcified and noncalcified plaque involving the descending thoracic aorta. No evidence  of vasculitis or penetrating ulcer. Upper normal heart size. No pericardial effusion. Coronary artery calcifications are seen. Again seen variant venous drainage on intercostal vein into the brachiocephalic, unchanged. Mediastinum/Nodes: Multiple small mediastinal lymph nodes not enlarged by size criteria. No hilar adenopathy. No esophageal wall thickening. No suspicious thyroid nodule. Lungs/Pleura: Moderately advanced emphysema. There is a thick-walled cavitary process in the periphery of the left lower lobe, series 13, image 72. This measures 3.8 cm on the current exam, increased in size from 2 cm previously. Subpleural bleb in the right lower lobe has increased in size from 2018 with adjacent chronic atelectasis/scarring. Mild chronic scarring in the lingula. Mild elevation of the right hemidiaphragm with adjacent compressive atelectasis. No pleural fluid. No pneumothorax. Upper Abdomen: Prominent size liver with steatosis, partially included. No acute findings. Musculoskeletal: Flowing syndesmophytes throughout the thoracic spine. There are no acute or suspicious osseous abnormalities. Review of the MIP images confirms the above findings. IMPRESSION: 1. No pulmonary embolus. 2. Moderately advanced emphysema. Thick-walled cavitary process in the periphery of the left lower lobe has increased in size from 2018 CT, currently 3.8 cm, previously 2 cm. This may represent an area of chronic scarring given prior chest tube placement in this region, however increased size is worrisome for malignancy or potentially superimposed infectious process. Recommend either short-term follow-up CT after course of treatment or further characterization with PET CT. 3. Subpleural bleb in the right lower lobe has increased in size from 2018 with adjacent compressive atelectasis/scarring. Aortic Atherosclerosis (ICD10-I70.0) and Emphysema (ICD10-J43.9). Electronically Signed   By: MKeith RakeM.D.   On: 12/26/2019 02:28   MR  ANGIO HEAD WO CONTRAST  Result Date: 12/26/2019 CLINICAL DATA:  Dizziness  EXAM: MRI HEAD WITHOUT CONTRAST MRA HEAD WITHOUT CONTRAST TECHNIQUE: Multiplanar, multiecho pulse sequences of the brain and surrounding structures were obtained without intravenous contrast. Angiographic images of the head were obtained using MRA technique without contrast. COMPARISON:  CT head 12/25/2019. FINDINGS: MRI HEAD FINDINGS Brain: No acute infarction, hemorrhage, hydrocephalus, extra-axial collection or mass lesion. Mild generalized volume loss with ex vacuo ventricular dilation. Scattered T2/FLAIR hyperintensities within the white matter, compatible with chronic microvascular ischemic disease. Vascular: Major arterial flow voids are maintained at the skull base. Skull and upper cervical spine: Normal marrow signal. Sinuses/Orbits: Sinuses are clear.  Unremarkable orbits. Other: No mastoid effusions. Small amount of suspected trapped fluid at the left petrous apex. MRA HEAD FINDINGS Anterior circulation: Limited evaluation of the ICAs proximal to the cavernous segment secondary to artifact. Within this limitation, no evidence of hemodynamically significant proximal stenosis or large vessel occlusion. No aneurysm identified. Posterior circulation: No evidence of hemodynamically significant stenosis or large vessel occlusion. Mild irregularity of the basilar artery and bilateral posterior cerebral arteries, likely related to atherosclerosis. Flow related signal identified bilateral posteroinferior cerebral arteries. IMPRESSION: 1. No acute intracranial abnormality. Specifically, no acute infarct. 2. No evidence of hemodynamically significant stenosis or large vessel occlusion, within limitations detailed above. 3. Generalized cerebral volume loss. Electronically Signed   By: Margaretha Sheffield MD   On: 12/26/2019 14:49   MR BRAIN WO CONTRAST  Result Date: 12/26/2019 CLINICAL DATA:  Dizziness EXAM: MRI HEAD WITHOUT CONTRAST MRA  HEAD WITHOUT CONTRAST TECHNIQUE: Multiplanar, multiecho pulse sequences of the brain and surrounding structures were obtained without intravenous contrast. Angiographic images of the head were obtained using MRA technique without contrast. COMPARISON:  CT head 12/25/2019. FINDINGS: MRI HEAD FINDINGS Brain: No acute infarction, hemorrhage, hydrocephalus, extra-axial collection or mass lesion. Mild generalized volume loss with ex vacuo ventricular dilation. Scattered T2/FLAIR hyperintensities within the white matter, compatible with chronic microvascular ischemic disease. Vascular: Major arterial flow voids are maintained at the skull base. Skull and upper cervical spine: Normal marrow signal. Sinuses/Orbits: Sinuses are clear.  Unremarkable orbits. Other: No mastoid effusions. Small amount of suspected trapped fluid at the left petrous apex. MRA HEAD FINDINGS Anterior circulation: Limited evaluation of the ICAs proximal to the cavernous segment secondary to artifact. Within this limitation, no evidence of hemodynamically significant proximal stenosis or large vessel occlusion. No aneurysm identified. Posterior circulation: No evidence of hemodynamically significant stenosis or large vessel occlusion. Mild irregularity of the basilar artery and bilateral posterior cerebral arteries, likely related to atherosclerosis. Flow related signal identified bilateral posteroinferior cerebral arteries. IMPRESSION: 1. No acute intracranial abnormality. Specifically, no acute infarct. 2. No evidence of hemodynamically significant stenosis or large vessel occlusion, within limitations detailed above. 3. Generalized cerebral volume loss. Electronically Signed   By: Margaretha Sheffield MD   On: 12/26/2019 14:49   DG CHEST PORT 1 VIEW  Result Date: 01/06/2020 CLINICAL DATA:  Shortness of breath EXAM: PORTABLE CHEST 1 VIEW COMPARISON:  Three days ago FINDINGS: Low volume chest with streaky density at the bases. No Kerley lines,  effusion, or pneumothorax. Normal heart size for technique. IMPRESSION: Lower volumes and increased atelectasis since 3 days ago. Electronically Signed   By: Monte Fantasia M.D.   On: 01/06/2020 07:39   DG Chest Portable 1 View  Result Date: 12/25/2019 CLINICAL DATA:  Shortness of breath EXAM: PORTABLE CHEST 1 VIEW COMPARISON:  07/11/2019 FINDINGS: The heart size and mediastinal contours are within normal limits. Scarring at the left lung base. The visualized  skeletal structures are unremarkable. IMPRESSION: No active disease. Electronically Signed   By: Ulyses Jarred M.D.   On: 12/25/2019 22:22    Subjective: Seen and examined and felt close to his baseline and felt well.  Denied chest pain, lightheadedness or dizziness.  Rested and ambulated without issues.  No nausea or vomiting.  Denies any other concerns at this time.  He is ready to be discharged home.  Discharge Exam: Vitals:   01/06/20 0800 01/06/20 0836  BP:    Pulse:    Resp:    Temp:    SpO2: 96% 94%   Vitals:   01/05/20 2156 01/06/20 0515 01/06/20 0800 01/06/20 0836  BP: 138/90 121/66    Pulse: 96 90    Resp: 18 16    Temp: 97.6 F (36.4 C) (!) 97.4 F (36.3 C)    TempSrc: Oral Oral    SpO2: 93% 93% 96% 94%  Weight:      Height:       General: Pt is alert, awake, not in acute distress Cardiovascular: RRR, S1/S2 +, no rubs, no gallops Respiratory: Diminished bilaterally with minimal wheezing but no appreciable rhonchi; unlabored breathing is wearing 2 L supplemental oxygen via nasal cannula Abdominal: Soft, NT, distended secondary body habitus, bowel sounds + Extremities: Trace edema, no cyanosis  The results of significant diagnostics from this hospitalization (including imaging, microbiology, ancillary and laboratory) are listed below for reference.    Microbiology: Recent Results (from the past 240 hour(s))  Respiratory Panel by RT PCR (Flu A&B, Covid) - Nasopharyngeal Swab     Status: None   Collection Time:  01/03/20 11:49 PM   Specimen: Nasopharyngeal Swab  Result Value Ref Range Status   SARS Coronavirus 2 by RT PCR NEGATIVE NEGATIVE Final    Comment: (NOTE) SARS-CoV-2 target nucleic acids are NOT DETECTED.  The SARS-CoV-2 RNA is generally detectable in upper respiratoy specimens during the acute phase of infection. The lowest concentration of SARS-CoV-2 viral copies this assay can detect is 131 copies/mL. A negative result does not preclude SARS-Cov-2 infection and should not be used as the sole basis for treatment or other patient management decisions. A negative result may occur with  improper specimen collection/handling, submission of specimen other than nasopharyngeal swab, presence of viral mutation(s) within the areas targeted by this assay, and inadequate number of viral copies (<131 copies/mL). A negative result must be combined with clinical observations, patient history, and epidemiological information. The expected result is Negative.  Fact Sheet for Patients:  PinkCheek.be  Fact Sheet for Healthcare Providers:  GravelBags.it  This test is no t yet approved or cleared by the [place] FDA and  has been authorized for detection and/or diagnosis of SARS-CoV-2 by FDA under an Emergency Use Authorization (EUA). This EUA will remain  in effect (meaning this test can be used) for the duration of the COVID-19 declaration under Section 564(b)(1) of the Act, 21 U.S.C. section 360bbb-3(b)(1), unless the authorization is terminated or revoked sooner.     Influenza A by PCR NEGATIVE NEGATIVE Final   Influenza B by PCR NEGATIVE NEGATIVE Final    Comment: (NOTE) The Xpert Xpress SARS-CoV-2/FLU/RSV assay is intended as an aid in  the diagnosis of influenza from Nasopharyngeal swab specimens and  should not be used as a sole basis for treatment. Nasal washings and  aspirates are unacceptable for Xpert Xpress  SARS-CoV-2/FLU/RSV  testing.  Fact Sheet for Patients: PinkCheek.be  Fact Sheet for Healthcare Providers: GravelBags.it  This test  is not yet approved or cleared by the [place] and  has been authorized for detection and/or diagnosis of SARS-CoV-2 by  FDA under an Emergency Use Authorization (EUA). This EUA will remain  in effect (meaning this test can be used) for the duration of the  Covid-19 declaration under Section 564(b)(1) of the Act, 21  U.S.C. section 360bbb-3(b)(1), unless the authorization is  terminated or revoked. Performed at [facility], [place] [address] [place], [place] [address]    Labs: BNP (last 3 results) Recent Labs    12/25/19 1856 01/03/20 2235  BNP 27.3 35.0   Basic Metabolic Panel: Recent Labs  Lab 01/03/20 2235 01/04/20 1645 01/05/20 0540 01/06/20 0446  NA 135  --  137 132*  K 4.4  --  4.3 4.3  CL 98  --  100 98  CO2 25  --  25 23  GLUCOSE 118*  --  165* 214*  BUN 21  --  19 25*  CREATININE 1.20 1.10 1.07 1.03  CALCIUM 9.9  --  9.1 9.0  MG  --   --   --  2.0  PHOS  --   --   --  3.8   Liver Function Tests: Recent Labs  Lab 01/03/20 2235 01/05/20 0540 01/06/20 0446  AST _0 ALT 37 28 27  ALKPHOS 64 58 54  BILITOT 0.6 0.6 0.7  PROT 7.8 6.9 6.7  ALBUMIN 4.6 4.3 4.0   No results for input(s): LIPASE, AMYLASE in the last 168 hours. No results for input(s): AMMONIA in the last 168 hours. CBC: Recent Labs  Lab 01/03/20 2235 01/04/20 1645 01/05/20 0540 01/06/20 0446  WBC 14.5* 14.6* 12.9* 17.2*  NEUTROABS 8.7*  --   --  14.6*  HGB 15.7 15.3 14.9 14.3  HCT 47.5 46.1 45.0 43.6  MCV 98.1 98.3 98.9 97.5  PLT 306 312 278 289   Cardiac Enzymes: No results for input(s): CKTOTAL, CKMB, CKMBINDEX, TROPONINI in the last 168 hours. BNP: Invalid input(s): POCBNP CBG: Recent Labs  Lab 01/05/20 1130 01/05/20 1643 01/05/20 2158  01/06/20 0744 01/06/20 1138  GLUCAP 268* 204* 240* 235* 240*   D-Dimer Recent Labs    01/03/20 2235  DDIMER <0.27   Hgb A1c Recent Labs    01/04/20 1645  HGBA1C 7.5*   Lipid Profile No results for input(s): CHOL, HDL, LDLCALC, TRIG, CHOLHDL, LDLDIRECT in the last 72 hours. Thyroid function studies No results for input(s): TSH, T4TOTAL, T3FREE, THYROIDAB in the last 72 hours.  Invalid input(s): FREET3 Anemia work up No results for input(s): VITAMINB12, FOLATE, FERRITIN, TIBC, IRON, RETICCTPCT in the last 72 hours. Urinalysis    Component Value Date/Time   COLORURINE YELLOW 12/25/2019 2238   APPEARANCEUR CLEAR 12/25/2019 2238   LABSPEC 1.025 12/25/2019 2238   PHURINE 5.0 12/25/2019 2238   GLUCOSEU NEGATIVE 12/25/2019 2238   HGBUR NEGATIVE 12/25/2019 2238   BILIRUBINUR NEGATIVE 12/25/2019 2238   KETONESUR 5 (A) 12/25/2019 2238   PROTEINUR NEGATIVE 12/25/2019 2238   UROBILINOGEN 1.0 01/12/2013 0024   NITRITE NEGATIVE 12/25/2019 2238   LEUKOCYTESUR NEGATIVE 12/25/2019 2238   Sepsis Labs Invalid input(s): PROCALCITONIN,  WBC,  LACTICIDVEN Microbiology Recent Results (from the past 240 hour(s))  Respiratory Panel by RT PCR (Flu A&B, Covid) - Nasopharyngeal Swab     Status: None   Collection Time: 01/03/20 11:49 PM   Specimen: Nasopharyngeal Swab  Result Value Ref Range Status   SARS Coronavirus 2 by RT PCR  NEGATIVE NEGATIVE Final    Comment: (NOTE) SARS-CoV-2 target nucleic acids are NOT DETECTED.  The SARS-CoV-2 RNA is generally detectable in upper respiratoy specimens during the acute phase of infection. The lowest concentration of SARS-CoV-2 viral copies this assay can detect is 131 copies/mL. A negative result does not preclude SARS-Cov-2 infection and should not be used as the sole basis for treatment or other patient management decisions. A negative result may occur with  improper specimen collection/handling, submission of specimen other than nasopharyngeal  swab, presence of viral mutation(s) within the areas targeted by this assay, and inadequate number of viral copies (<131 copies/mL). A negative result must be combined with clinical observations, patient history, and epidemiological information. The expected result is Negative.  Fact Sheet for Patients:  PinkCheek.be  Fact Sheet for Healthcare Providers:  GravelBags.it  This test is no t yet approved or cleared by the [place] FDA and  has been authorized for detection and/or diagnosis of SARS-CoV-2 by FDA under an Emergency Use Authorization (EUA). This EUA will remain  in effect (meaning this test can be used) for the duration of the COVID-19 declaration under Section 564(b)(1) of the Act, 21 U.S.C. section 360bbb-3(b)(1), unless the authorization is terminated or revoked sooner.     Influenza A by PCR NEGATIVE NEGATIVE Final   Influenza B by PCR NEGATIVE NEGATIVE Final    Comment: (NOTE) The Xpert Xpress SARS-CoV-2/FLU/RSV assay is intended as an aid in  the diagnosis of influenza from Nasopharyngeal swab specimens and  should not be used as a sole basis for treatment. Nasal washings and  aspirates are unacceptable for Xpert Xpress SARS-CoV-2/FLU/RSV  testing.  Fact Sheet for Patients: PinkCheek.be  Fact Sheet for Healthcare Providers: GravelBags.it  This test is not yet approved or cleared by the [place] FDA and  has been authorized for detection and/or diagnosis of SARS-CoV-2 by  FDA under an Emergency Use Authorization (EUA). This EUA will remain  in effect (meaning this test can be used) for the duration of the  Covid-19 declaration under Section 564(b)(1) of the Act, 21  U.S.C. section 360bbb-3(b)(1), unless the authorization is  terminated or revoked. Performed at [facility], [place] [address], [place], [place] [address]     Time coordinating discharge: 35 minutes  SIGNED:  Kerney Elbe, DO [facility] 01/06/2020, 6:51 PM Pager is on [place]  If 7PM-7AM, please contact night-coverage [email or web address]

## 2020-03-21 ENCOUNTER — Other Ambulatory Visit: Payer: Self-pay

## 2020-03-21 ENCOUNTER — Emergency Department (HOSPITAL_COMMUNITY)
Admission: EM | Admit: 2020-03-21 | Discharge: 2020-03-22 | Disposition: A | Discharge status: Home / Self Care | Payer: No Typology Code available for payment source | Attending: Emergency Medicine | Admitting: Emergency Medicine

## 2020-03-21 ENCOUNTER — Encounter (HOSPITAL_COMMUNITY): Payer: Self-pay

## 2020-03-21 ENCOUNTER — Emergency Department (HOSPITAL_COMMUNITY): Payer: No Typology Code available for payment source

## 2020-03-21 DIAGNOSIS — Z20822 Contact with and (suspected) exposure to covid-19: Secondary | ICD-10-CM | POA: Diagnosis not present

## 2020-03-21 DIAGNOSIS — E1122 Type 2 diabetes mellitus with diabetic chronic kidney disease: Secondary | ICD-10-CM | POA: Diagnosis not present

## 2020-03-21 DIAGNOSIS — Z87891 Personal history of nicotine dependence: Secondary | ICD-10-CM | POA: Insufficient documentation

## 2020-03-21 DIAGNOSIS — Z7984 Long term (current) use of oral hypoglycemic drugs: Secondary | ICD-10-CM | POA: Insufficient documentation

## 2020-03-21 DIAGNOSIS — I129 Hypertensive chronic kidney disease with stage 1 through stage 4 chronic kidney disease, or unspecified chronic kidney disease: Secondary | ICD-10-CM | POA: Insufficient documentation

## 2020-03-21 DIAGNOSIS — R531 Weakness: Secondary | ICD-10-CM | POA: Diagnosis present

## 2020-03-21 DIAGNOSIS — R350 Frequency of micturition: Secondary | ICD-10-CM | POA: Insufficient documentation

## 2020-03-21 DIAGNOSIS — D72819 Decreased white blood cell count, unspecified: Secondary | ICD-10-CM | POA: Diagnosis not present

## 2020-03-21 DIAGNOSIS — J441 Chronic obstructive pulmonary disease with (acute) exacerbation: Secondary | ICD-10-CM | POA: Insufficient documentation

## 2020-03-21 DIAGNOSIS — N1831 Chronic kidney disease, stage 3a: Secondary | ICD-10-CM | POA: Insufficient documentation

## 2020-03-21 DIAGNOSIS — Z79899 Other long term (current) drug therapy: Secondary | ICD-10-CM | POA: Diagnosis not present

## 2020-03-21 DIAGNOSIS — E1165 Type 2 diabetes mellitus with hyperglycemia: Secondary | ICD-10-CM | POA: Diagnosis not present

## 2020-03-21 LAB — CBC WITH DIFFERENTIAL/PLATELET
Abs Immature Granulocytes: 0.04 10*3/uL (ref 0.00–0.07)
Basophils Absolute: 0.1 10*3/uL (ref 0.0–0.1)
Basophils Relative: 1 %
Eosinophils Absolute: 0.1 10*3/uL (ref 0.0–0.5)
Eosinophils Relative: 1 %
HCT: 46.7 % (ref 39.0–52.0)
Hemoglobin: 15.8 g/dL (ref 13.0–17.0)
Immature Granulocytes: 0 %
Lymphocytes Relative: 23 %
Lymphs Abs: 2.8 10*3/uL (ref 0.7–4.0)
MCH: 33.5 pg (ref 26.0–34.0)
MCHC: 33.8 g/dL (ref 30.0–36.0)
MCV: 98.9 fL (ref 80.0–100.0)
Monocytes Absolute: 1.1 10*3/uL — ABNORMAL HIGH (ref 0.1–1.0)
Monocytes Relative: 9 %
Neutro Abs: 7.9 10*3/uL — ABNORMAL HIGH (ref 1.7–7.7)
Neutrophils Relative %: 66 %
Platelets: 296 10*3/uL (ref 150–400)
RBC: 4.72 MIL/uL (ref 4.22–5.81)
RDW: 13.1 % (ref 11.5–15.5)
WBC: 12.1 10*3/uL — ABNORMAL HIGH (ref 4.0–10.5)
nRBC: 0 % (ref 0.0–0.2)

## 2020-03-21 LAB — URINALYSIS, ROUTINE W REFLEX MICROSCOPIC
Bilirubin Urine: NEGATIVE
Glucose, UA: NEGATIVE mg/dL
Hgb urine dipstick: NEGATIVE
Ketones, ur: NEGATIVE mg/dL
Leukocytes,Ua: NEGATIVE
Nitrite: NEGATIVE
Protein, ur: NEGATIVE mg/dL
Specific Gravity, Urine: 1.012 (ref 1.005–1.030)
pH: 5 (ref 5.0–8.0)

## 2020-03-21 LAB — COMPREHENSIVE METABOLIC PANEL
ALT: 34 U/L (ref 0–44)
AST: 29 U/L (ref 15–41)
Albumin: 4.1 g/dL (ref 3.5–5.0)
Alkaline Phosphatase: 49 U/L (ref 38–126)
Anion gap: 12 (ref 5–15)
BUN: 15 mg/dL (ref 8–23)
CO2: 23 mmol/L (ref 22–32)
Calcium: 8.8 mg/dL — ABNORMAL LOW (ref 8.9–10.3)
Chloride: 101 mmol/L (ref 98–111)
Creatinine, Ser: 0.95 mg/dL (ref 0.61–1.24)
GFR, Estimated: >60 mL/min (ref >60)
Glucose, Bld: 158 mg/dL — ABNORMAL HIGH (ref 70–99)
Potassium: 4.2 mmol/L (ref 3.5–5.1)
Sodium: 136 mmol/L (ref 135–145)
Total Bilirubin: 0.4 mg/dL (ref 0.3–1.2)
Total Protein: 7 g/dL (ref 6.5–8.1)

## 2020-03-21 LAB — POC SARS CORONAVIRUS 2 AG -  ED: SARS Coronavirus 2 Ag: NEGATIVE

## 2020-03-21 MED ORDER — PREDNISONE 20 MG PO TABS: 40.0000 mg | ORAL_TABLET | Freq: Every day | ORAL | 0 refills | Status: AC

## 2020-03-21 MED ORDER — METOCLOPRAMIDE HCL 5 MG/ML IJ SOLN
10.0000 mg | Freq: Once | INTRAMUSCULAR | Status: AC
  Administered 2020-03-21: 10 mg via INTRAVENOUS
  Filled 2020-03-21: qty 2

## 2020-03-21 MED ORDER — METHYLPREDNISOLONE SODIUM SUCC 125 MG IJ SOLR
125.0000 mg | Freq: Once | INTRAMUSCULAR | Status: AC
  Administered 2020-03-21: 125 mg via INTRAVENOUS
  Filled 2020-03-21: qty 2

## 2020-03-21 NOTE — Discharge Instructions (Addendum)
You came to the Emergency Department today to be evaluated for your dizziness and shortness of breath.  He tested negative for COVID-19.  Your urine showed no signs of a urinary tract infection.  Your lab work was reassuring.  Your chest x-ray showed no infection.  The CT scan of your head showed no acute abnormalities.  Your shortness of breath is likely due to a COPD exacerbation.  You received steroids in the emergency department and I have prescribed you a 5-day course of oral steroids to take at home.  Some common side effects include feelings of extra energy, feeling warm, increased appetite, and stomach upset.  If you are diabetic your sugars may run higher than usual.   Please return to the emergency department if: Develop sudden weakness, especially on one side of your face or body. Have trouble breathing or shortness of breath. Have problems with your vision. Have trouble talking or swallowing. Have trouble standing or walking. Are light-headed or lose consciousness.You have worsening shortness of breath, even when resting. You have trouble talking. You have severe chest pain. You cough up blood. You have a fever. You have weakness, vomit repeatedly, or faint. You feel confused. You are not able to sleep because of your symptoms. You have trouble doing daily activities.

## 2020-03-21 NOTE — ED Provider Notes (Signed)
Medical screening examination/treatment/procedure(s) were conducted as a shared visit with non-physician practitioner(s) and myself.  I personally evaluated the patient during the encounter.    70 year old male presents with weakness and dizziness x2 days along with increased shortness of breath.  States his dizziness gets worse with standing.  Patient states that he has had some suprapubic tenderness as well as urinary frequency.  Denies any flank pain.  Denies any focal neurological deficits.  On exam he is somewhat tachypneic.  Will order chest x-ray and labs.   Lacretia Leigh, MD 03/21/20 [phone]

## 2020-03-21 NOTE — ED Notes (Signed)
Patient requesting pain medicine, PA Peter notified.

## 2020-03-21 NOTE — ED Provider Notes (Addendum)
[facility] DEPT Provider Note   CSN: [id number] Arrival date & time: 03/21/20  1823     History Chief Complaint  Patient presents with  . Weakness    Mario [name] is a 70 y.o. male with a history of COPD, hypertension, diabetes type 2, major depressive disorder, OSA.  Patient presents with a chief complaint of generalized weakness and dizziness.  Patient reports that his symptoms started around 16:00 this afternoon, have been constant, worse when he changes positions, no alleviating factors.  Patient also complains of shortness of breath that is worse with exertion.  Reports he uses oxygen at home when exerting himself.  Patient denies any cough or hemoptysis.  Also endorses some suprapubic tenderness and increased urinary frequency.  Patient denies any fevers, chills, cough, nausea, vomiting, syncopal episodes, numbness or tingling in extremities, confusion, neck or back pain.  Patient denies any recent traumatic injuries.  Per chart review patient was seen in the emergency department 12/25/19 for complaints of dizziness.  Patient had MRI performed at that time which did not show any acute illness.  Patient was diagnosed with acute vertigo and dizziness secondary to benign positional vertigo.    HPI     Past Medical History:  Diagnosis Date  . Anxiety   . Bronchitis   . COPD (chronic obstructive pulmonary disease) ([place])   . Depression   . Hypertension   . Mental disorder   . MI (myocardial infarction) ([place])    ????  . OSA (obstructive sleep apnea)   . Suicide attempt ([place])   . Tension pneumothorax 06/27/2016  . Uveitis     Patient Active Problem List   Diagnosis Date Noted  . Acute exacerbation of chronic obstructive pulmonary disease (COPD) ([place]) 01/05/2020  . Vertigo 12/26/2019  . 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 Stage 3a chronic kidney disease ([place]) 12/26/2019  . Abnormal CT of the chest 12/26/2019  . COPD (chronic obstructive  pulmonary disease) ([place])   . AKI (acute kidney injury) ([place])   . Hyperglycemia 06/23/2019  . OSA (obstructive sleep apnea) 06/23/2019  . Suicidal ideations 06/23/2019  . Major depressive disorder, recurrent episode ([place]) 10/12/2018  . 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 Cellulitis of both feet 07/16/2017  . DM2 (diabetes mellitus, type 2) ([place]) 07/16/2017  . HLA B27 (HLA B27 positive) 07/16/2017  . Acute chest pain   . Hyperkalemia   . Spontaneous pneumothorax 06/27/2016  . Hypoxia   . Pneumothorax on left   . Dyspnea 05/23/2014  . COPD exacerbation ([place]) 05/23/2014  . Malingering 01/21/2013  . Depression 01/11/2013  . Tobacco abuse 01/11/2013  . Obesity, unspecified 01/11/2013  . Chest pain 01/10/2013  . HTN (hypertension) 01/10/2013    Past Surgical History:  Procedure Laterality Date  . CHEST TUBE INSERTION Left 06/27/2016  . cryptorchidism    . SKIN CANCER EXCISION         Family History  Problem Relation Age of Onset  . Dementia Father     Social History   Tobacco Use  . Smoking status: Former Smoker    Packs/day: 1.00    Years: 35.00    Pack years: 35.00    Types: Cigarettes    Quit date: 05/2016    Years since quitting: 3.8  . Smokeless tobacco: Never Used  Vaping Use  . Vaping Use: Never used  Substance Use Topics  . Alcohol use: No    Alcohol/week: 0.0 standard drinks  Comment: denies use of any drugs or alcohol  . Drug use: No    Home Medications Prior to Admission medications   Medication Sig Start Date End Date Taking? Authorizing Provider  buPROPion (WELLBUTRIN XL) 300 MG 24 hr tablet Take 300 mg by mouth daily.     [provider]  busPIRone (BUSPAR) 15 MG tablet Take 15 mg by mouth daily.     [provider]  calcium-vitamin D (OSCAL WITH D) 500-200 MG-UNIT tablet Take 1 tablet by mouth 2 (two) times daily with a meal.    [provider]  Cholecalciferol (VITAMIN  D-3) 25 MCG (1000 UT) CAPS Take 1,000 Units by mouth daily. IN ADDITION TO CALCIUM +D    [provider]  cyclobenzaprine (FLEXERIL) 10 MG tablet Take 10 mg by mouth at bedtime.    [provider]  diltiazem (CARDIZEM CD) 180 MG 24 hr capsule Take 180 mg by mouth daily.    [provider]  docusate calcium (SURFAK) 240 MG capsule Take 240 mg by mouth daily.    [provider]  DULoxetine (CYMBALTA) 60 MG capsule Take 1 capsule (60 mg total) by mouth 2 (two) times daily. Patient taking differently: Take 60 mg by mouth 2 (two) times daily with a meal.  07/21/17   Lavina Hamman, MD  etanercept (ENBREL) 50 MG/ML injection Inject 50 mg into the skin once a week.     [provider]  fluticasone (FLONASE) 50 MCG/ACT nasal spray Place 2 sprays into both nostrils daily.     [provider]  folic acid (FOLVITE) 1 MG tablet Take 1 mg by mouth daily.    [provider]  gabapentin (NEURONTIN) 300 MG capsule Take 2 capsules (600 mg total) by mouth 2 (two) times daily. Patient taking differently: Take 900 mg by mouth at bedtime. 3 capsules at bedtime 07/06/16   Lars Pinks M, PA-C  guaifenesin (HUMIBID E) 400 MG TABS tablet Take 400 mg by mouth in the morning and at bedtime.     [provider]  insulin glargine (LANTUS) 100 UNIT/ML injection Inject 13 Units into the skin at bedtime.     [provider]  lisinopril (ZESTRIL) 20 MG tablet Take 20 mg by mouth daily.    [provider]  lurasidone (LATUDA) 40 MG TABS tablet Take 40 mg by mouth daily after supper.    [provider]  Melatonin 3 MG TABS Take 6 mg by mouth at bedtime.    [provider]  metFORMIN (GLUCOPHAGE) 500 MG tablet Take 500 mg by mouth in the morning and at bedtime.    [provider]  methotrexate (RHEUMATREX) 2.5 MG tablet Take 6 tablets (15 mg total) by mouth every Saturday. Hold until seen by rheumatology   Caution:Chemotherapy. Protect from light. Patient taking differently: Take 15 mg by mouth once a week.  07/22/17   Lavina Hamman, MD  Multiple Vitamin (MULTIVITAMIN WITH MINERALS) TABS tablet Take 1 tablet by mouth daily.    [provider]  mycophenolate (CELLCEPT) 250 MG capsule Take 1,500 mg by mouth 2 (two) times daily.    [provider]  nitroGLYCERIN (NITROSTAT) 0.4 MG SL tablet Place 1 tablet (0.4 mg total) under the tongue every 5 (five) minutes as needed for chest pain. 07/03/19   Danford, Suann Larry, MD  ondansetron (ZOFRAN) 4 MG tablet Take 1 tablet (4 mg total) by mouth every 6 (six) hours as needed for nausea. 01/06/20  Sheikh, Omair Latif, DO  Oxcarbazepine (TRILEPTAL) 300 MG tablet Take 450 mg by mouth 3 (three) times daily as needed (nerve pain).     [provider]  OXYGEN Inhale 2 L/min into the lungs See admin instructions. 2 liters/min at bedtime with CPAP, during naps and all times of exertion    [provider]  polyethylene glycol (MIRALAX / GLYCOLAX) 17 g packet Take 17 g by mouth daily.    [provider]  prednisoLONE acetate (PRED FORTE) 1 % ophthalmic suspension Place 1 drop into both eyes See admin instructions. INSTILL 1 DROP IN LEFT EYE EVERY HOUR AND FOLLOW TAPER SCHEDULE FOR PRED FORTE FOR THE RIGHT EYE    [provider]  predniSONE (STERAPRED UNI-PAK 21 TAB) 10 MG (21) TBPK tablet Take 6 pills on day 1, 5 pills on day 2, 4 pills on day 3, 3 pills on day 4, 2 pills on day 5, 1 pill on day 6, and then stop on day 7 01/06/20   Sheikh, [employer] Latif, DO  PRESCRIPTION MEDICATION CPAP- At bedtime and during during any naps    [provider]  Propylene Glycol (SYSTANE BALANCE) 0.6 % SOLN Place 1 drop into both eyes in the morning, at noon, and at bedtime.     [provider]  sodium chloride (OCEAN) 0.65 % SOLN nasal spray Place 2 sprays into both nostrils 4 (four) times daily as needed for congestion.     [provider]  tamsulosin (FLOMAX) 0.4 MG CAPS capsule Take 0.4 mg by mouth daily.    [provider]  Tiotropium Bromide-Olodaterol (STIOLTO RESPIMAT) 2.5-2.5 MCG/ACT AERS Inhale 2 puffs into the lungs daily.    [provider]  traMADol (ULTRAM) 50 MG tablet Take 50 mg by mouth 2 (two) times daily.     [provider]  traZODone (DESYREL) 50 MG tablet Take 75 mg by mouth at bedtime.     [provider]  vitamin C (ASCORBIC ACID) 500 MG tablet Take 500 mg by mouth 2 (two) times daily.     [provider]    Allergies    Demerol [meperidine], Zocor [simvastatin], Beet [beta vulgaris], and Liver  Review of Systems   Review of Systems  Constitutional: Negative for chills and fever.  Eyes: Negative for visual disturbance.  Respiratory: Positive for shortness of breath.   Cardiovascular: Negative for chest pain.  Gastrointestinal: Negative for abdominal pain, nausea and vomiting.  Genitourinary: Positive for frequency. Negative for difficulty urinating and dysuria.  Musculoskeletal: Negative for back pain and neck pain.  Skin: Negative for color change and rash.  Neurological: Positive for dizziness and weakness (Generalized). Negative for tremors, seizures, syncope, facial asymmetry, speech difficulty, light-headedness, numbness and headaches.  Psychiatric/Behavioral: Negative for confusion.    Physical Exam Updated Vital Signs BP (!) 155/80 (BP Location: Right Arm)   Pulse 99   Temp 98.3 F (36.8 C) (Oral)   Resp (!) 22   SpO2 95%   Physical Exam Vitals and nursing note reviewed.  Constitutional:      General: He is not in acute distress.    Appearance: He is obese. He is not ill-appearing, toxic-appearing or diaphoretic.     Interventions: Nasal cannula in place.  HENT:     Head: Normocephalic and atraumatic.  Eyes:     General: No scleral icterus.       Right eye: No discharge.        Left eye: No discharge.  Extraocular Movements: Extraocular movements intact.     Pupils: Pupils are equal, round, and reactive to light.  Cardiovascular:     Rate and Rhythm: Normal rate.  Pulmonary:     Effort: Pulmonary effort is normal. Tachypnea and prolonged expiration present. No respiratory distress.     Breath sounds: No stridor. No wheezing, rhonchi or rales.  Chest:     Chest wall: No tenderness.  Abdominal:     General: Abdomen is protuberant. Bowel sounds are normal. There is no distension.     Palpations: Abdomen is soft. There is pulsatile mass. There is no mass.     Tenderness: There is abdominal tenderness in the suprapubic area.  Musculoskeletal:     Cervical back: Normal range of motion and neck supple. No rigidity.     Right lower leg: No edema.     Left lower leg: No edema.  Skin:    General: Skin is warm and dry.  Neurological:     General: No focal deficit present.     Mental Status: He is alert.     GCS: GCS eye subscore is 4. GCS verbal subscore is 5. GCS motor subscore is 6.     Cranial Nerves: No cranial nerve deficit or facial asymmetry.     Sensory: Sensation is intact.     Motor: No weakness, tremor, seizure activity or pronator drift.     Coordination: Romberg sign negative. Finger-Nose-Finger Test normal.     Gait: Gait is intact.     Comments: CN II-XII intact, equal grip strength, +5 strength to bilateral upper and lower extremities   Patient was unsteady walking however uses cane at baseline  Psychiatric:        Behavior: Behavior is cooperative.     ED Results / Procedures / Treatments   Labs (all labs ordered are listed, but only abnormal results are displayed) Labs Reviewed  CBC WITH DIFFERENTIAL/PLATELET - Abnormal; Notable for the following components:      Result Value   WBC 12.1 (*)    Neutro Abs 7.9 (*)    Monocytes Absolute 1.1 (*)    All other components within normal limits  COMPREHENSIVE METABOLIC PANEL - Abnormal; Notable for the following  components:   Glucose, Bld 158 (*)    Calcium 8.8 (*)    All other components within normal limits  URINALYSIS, ROUTINE W REFLEX MICROSCOPIC  POC SARS CORONAVIRUS 2 AG -  ED    EKG None  Radiology CT Head Wo Contrast  Result Date: 03/21/2020 CLINICAL DATA:  70 year old male with dizziness. EXAM: CT HEAD WITHOUT CONTRAST TECHNIQUE: Contiguous axial images were obtained from the base of the skull through the vertex without intravenous contrast. COMPARISON:  Head CT dated 12/25/2019. FINDINGS: Brain: There is mild age-related atrophy and chronic microvascular ischemic changes. There is no acute intracranial hemorrhage. No mass effect or midline shift no extra-axial fluid collection. Vascular: No hyperdense vessel or unexpected calcification. Skull: Normal. Negative for fracture or focal lesion. Sinuses/Orbits: No acute finding. Other: None IMPRESSION: 1. No acute intracranial pathology. 2. Mild age-related atrophy and chronic microvascular ischemic changes. Electronically Signed   By: Anner Crete M.D.   On: 03/21/2020 21:10   DG Chest Portable 1 View  Result Date: 03/21/2020 CLINICAL DATA:  Shortness of breath, weakness, and dizziness beginning about our ago. History of COPD. Former smoker. EXAM: PORTABLE CHEST 1 VIEW COMPARISON:  01/06/2020 FINDINGS: Technical quality of the examination is limited by body habitus. Heart size and  pulmonary vascularity are normal for technique. Emphysematous changes are suggested in the lungs. Linear atelectasis or infiltration in the lung bases is similar to prior study. No pleural effusion. No pneumothorax. Mediastinal contours appear intact. IMPRESSION: Emphysematous changes in the lungs. Infiltration or atelectasis in the lung bases, similar to previous study. Electronically Signed   By: Lucienne Capers M.D.   On: 03/21/2020 19:36    Procedures Procedures (including critical care time)  Medications Ordered in ED Medications - No data to display  ED  Course  I have reviewed the triage vital signs and the nursing notes.  Pertinent labs & imaging results that were available during my care of the patient were reviewed by me and considered in my medical decision making (see chart for details).    MDM Rules/Calculators/A&P                          Pleasant 70 year old male in no acute distress.  Patient complains of dizziness, generalized weakness, and shortness of breath.    Patient observed to be on 2 L oxygen via nasal cannula.  Oxygen was turned off during interview and patient's saturation was noted to be greater than 90% on room air.  Patient was able to speak in full complete sentences without difficulty.  Patient was noted to be slightly tachypneic.      On physical exam lungs are clear to auscultation.  Chest x-ray showed emphysematous changes in the lungs; infiltration or atelectasis in lung bases similar to previous study; no pleural effusion or pneumothorax.  Likely findings on chest x-ray correlate to atelectasis as patient had no rhonchi on physical exam.  Patient was tested for COVID-19 and found to be negative.  Concern for COPD exacerbation.  Patient given Solu-Medrol in emergency department and prescribed 5-day course of prednisone.  Patient was warned that steroids increase his blood sugar.  Patient denies any recent falls or injuries.  No neurological deficits were noted on physical exam.  Patient denies any change in dizziness when moved to standing position during physical exam.  Noncontrast head CT showed no acute intracranial pathology; mild age-related atrophy and chronic microvascular ischemic changes.  On physical exam patient complained of tenderness to suprapubic region.  Patient also reported increased urinary frequency.  UA was ordered and found to be unremarkable.  CMP showed glucose elevated at 158.  CBC showed slight leukocytosis at 12.1.    While in the emergency department patient complained of headache reported  headache came on gradually, pain was not maximal at onset, similar to previous headaches he has had in the past.  Patient was given Reglan.  Patient reports improvement in headache after receiving medication.  Patient vital signs remained stable throughout hospitalization.  Will discharge patient with follow-up to PCP.  Discussed results, findings, treatment and follow up. Patient advised of return precautions. Patient verbalized understanding and agreed with plan.  Patient was discussed with and evaluated by Dr. Zenia Resides.    Mario [name] was evaluated in Emergency Department on 03/22/2020 for the symptoms described in the history of present illness. He was evaluated in the context of the global COVID-19 pandemic, which necessitated consideration that the patient might be at risk for infection with the SARS-CoV-2 virus that causes COVID-19. Institutional protocols and algorithms that pertain to the evaluation of patients at risk for COVID-19 are in a state of rapid change based on information released by regulatory bodies including the CDC and federal and state organizations.  These policies and algorithms were followed during the patient's care in the ED.   Final Clinical Impression(s) / ED Diagnoses Final diagnoses:  Generalized weakness  COPD exacerbation ([place])    Rx / DC Orders ED Discharge Orders         Ordered    predniSONE (DELTASONE) 20 MG tablet  Daily        03/21/20 2319           Loni Beckwith, PA-C 03/22/20 0223    Loni Beckwith, PA-C 03/22/20 3016    Lacretia Leigh, MD 03/22/20 2242

## 2020-03-21 NOTE — ED Notes (Addendum)
Patient calling an uber for a ride back to his apartment. Patient states he does not want to wait hours for the [facility] service. Patient stating he will be careful walking up the stairs when he arrives back home.

## 2020-03-21 NOTE — ED Triage Notes (Signed)
Pt BIB EMS from home. Pt reports feeling weak and dizzy about an hour ago. Pt reports this same thing happened about a month ago and was seen here. A&O x4.   140/100 HR 90 96% 2L

## 2020-03-27 ENCOUNTER — Encounter (HOSPITAL_COMMUNITY): Payer: Self-pay | Admitting: Emergency Medicine

## 2020-03-27 ENCOUNTER — Emergency Department (HOSPITAL_COMMUNITY): Payer: No Typology Code available for payment source

## 2020-03-27 ENCOUNTER — Other Ambulatory Visit: Payer: Self-pay

## 2020-03-27 ENCOUNTER — Emergency Department (HOSPITAL_COMMUNITY)
Admission: EM | Admit: 2020-03-27 | Discharge: 2020-03-27 | Disposition: A | Discharge status: Home / Self Care | Payer: No Typology Code available for payment source | Attending: Emergency Medicine | Admitting: Emergency Medicine

## 2020-03-27 DIAGNOSIS — I129 Hypertensive chronic kidney disease with stage 1 through stage 4 chronic kidney disease, or unspecified chronic kidney disease: Secondary | ICD-10-CM | POA: Insufficient documentation

## 2020-03-27 DIAGNOSIS — Z87891 Personal history of nicotine dependence: Secondary | ICD-10-CM | POA: Diagnosis not present

## 2020-03-27 DIAGNOSIS — Z7984 Long term (current) use of oral hypoglycemic drugs: Secondary | ICD-10-CM | POA: Insufficient documentation

## 2020-03-27 DIAGNOSIS — J441 Chronic obstructive pulmonary disease with (acute) exacerbation: Secondary | ICD-10-CM | POA: Diagnosis not present

## 2020-03-27 DIAGNOSIS — Z79899 Other long term (current) drug therapy: Secondary | ICD-10-CM | POA: Insufficient documentation

## 2020-03-27 DIAGNOSIS — E1122 Type 2 diabetes mellitus with diabetic chronic kidney disease: Secondary | ICD-10-CM | POA: Diagnosis not present

## 2020-03-27 DIAGNOSIS — Z794 Long term (current) use of insulin: Secondary | ICD-10-CM | POA: Diagnosis not present

## 2020-03-27 DIAGNOSIS — E1165 Type 2 diabetes mellitus with hyperglycemia: Secondary | ICD-10-CM | POA: Diagnosis not present

## 2020-03-27 DIAGNOSIS — N1831 Chronic kidney disease, stage 3a: Secondary | ICD-10-CM | POA: Diagnosis not present

## 2020-03-27 DIAGNOSIS — M79674 Pain in right toe(s): Secondary | ICD-10-CM | POA: Diagnosis present

## 2020-03-27 LAB — BASIC METABOLIC PANEL
Anion gap: 14 (ref 5–15)
BUN: 16 mg/dL (ref 8–23)
CO2: 23 mmol/L (ref 22–32)
Calcium: 9.1 mg/dL (ref 8.9–10.3)
Chloride: 99 mmol/L (ref 98–111)
Creatinine, Ser: 1.23 mg/dL (ref 0.61–1.24)
GFR, Estimated: >60 mL/min (ref >60)
Glucose, Bld: 138 mg/dL — ABNORMAL HIGH (ref 70–99)
Potassium: 4.2 mmol/L (ref 3.5–5.1)
Sodium: 136 mmol/L (ref 135–145)

## 2020-03-27 LAB — URIC ACID: Uric Acid, Serum: 5.2 mg/dL (ref 3.7–8.6)

## 2020-03-27 LAB — CBC WITH DIFFERENTIAL/PLATELET
Abs Immature Granulocytes: 0.05 10*3/uL (ref 0.00–0.07)
Basophils Absolute: 0.1 10*3/uL (ref 0.0–0.1)
Basophils Relative: 1 %
Eosinophils Absolute: 0.2 10*3/uL (ref 0.0–0.5)
Eosinophils Relative: 2 %
HCT: 46.3 % (ref 39.0–52.0)
Hemoglobin: 15.5 g/dL (ref 13.0–17.0)
Immature Granulocytes: 1 %
Lymphocytes Relative: 32 %
Lymphs Abs: 3.5 10*3/uL (ref 0.7–4.0)
MCH: 33.1 pg (ref 26.0–34.0)
MCHC: 33.5 g/dL (ref 30.0–36.0)
MCV: 98.9 fL (ref 80.0–100.0)
Monocytes Absolute: 0.9 10*3/uL (ref 0.1–1.0)
Monocytes Relative: 8 %
Neutro Abs: 6.2 10*3/uL (ref 1.7–7.7)
Neutrophils Relative %: 56 %
Platelets: 294 10*3/uL (ref 150–400)
RBC: 4.68 MIL/uL (ref 4.22–5.81)
RDW: 13.2 % (ref 11.5–15.5)
WBC: 10.9 10*3/uL — ABNORMAL HIGH (ref 4.0–10.5)
nRBC: 0 % (ref 0.0–0.2)

## 2020-03-27 MED ORDER — DILTIAZEM HCL ER COATED BEADS 180 MG PO CP24
180.0000 mg | ORAL_CAPSULE | Freq: Every day | ORAL | Status: DC
  Filled 2020-03-27: qty 1

## 2020-03-27 MED ORDER — CALCIUM CARBONATE-VITAMIN D 500-200 MG-UNIT PO TABS
1.0000 | ORAL_TABLET | Freq: Two times a day (BID) | ORAL | Status: DC
  Filled 2020-03-27: qty 1

## 2020-03-27 MED ORDER — TRAZODONE HCL 50 MG PO TABS
75.0000 mg | ORAL_TABLET | Freq: Every day | ORAL | Status: DC
  Administered 2020-03-27: 75 mg via ORAL
  Filled 2020-03-27: qty 2

## 2020-03-27 MED ORDER — GABAPENTIN 300 MG PO CAPS
900.0000 mg | ORAL_CAPSULE | Freq: Every day | ORAL | Status: DC
  Administered 2020-03-27: 900 mg via ORAL
  Filled 2020-03-27: qty 3

## 2020-03-27 MED ORDER — HYDROCODONE-ACETAMINOPHEN 5-325 MG PO TABS: 1.0000 | ORAL_TABLET | Freq: Four times a day (QID) | ORAL | 0 refills | Status: DC | PRN

## 2020-03-27 MED ORDER — ARTIFICIAL TEARS OPHTHALMIC OINT
TOPICAL_OINTMENT | Freq: Once | OPHTHALMIC | Status: DC
  Filled 2020-03-27: qty 3.5

## 2020-03-27 MED ORDER — OXCARBAZEPINE 300 MG PO TABS: 600.0000 mg | ORAL_TABLET | Freq: Four times a day (QID) | ORAL | Status: DC | PRN

## 2020-03-27 MED ORDER — DEXAMETHASONE SODIUM PHOSPHATE 10 MG/ML IJ SOLN
10.0000 mg | Freq: Once | INTRAMUSCULAR | Status: AC
  Administered 2020-03-27: 10 mg via INTRAMUSCULAR
  Filled 2020-03-27: qty 1

## 2020-03-27 MED ORDER — BUSPIRONE HCL 10 MG PO TABS
15.0000 mg | ORAL_TABLET | Freq: Every day | ORAL | Status: DC
  Filled 2020-03-27: qty 2

## 2020-03-27 MED ORDER — INSULIN GLARGINE 100 UNIT/ML ~~LOC~~ SOLN
12.0000 [IU] | Freq: Every day | SUBCUTANEOUS | Status: DC
  Filled 2020-03-27: qty 0.12

## 2020-03-27 MED ORDER — HYDROMORPHONE HCL 1 MG/ML IJ SOLN
1.0000 mg | Freq: Once | INTRAMUSCULAR | Status: AC
Start: 2020-03-27 — End: 2020-03-27
  Administered 2020-03-27: 1 mg via INTRAMUSCULAR
  Filled 2020-03-27: qty 1

## 2020-03-27 MED ORDER — PREDNISONE 20 MG PO TABS: 40.0000 mg | ORAL_TABLET | Freq: Every day | ORAL | 0 refills | Status: DC

## 2020-03-27 MED ORDER — DULOXETINE HCL 30 MG PO CPEP
60.0000 mg | ORAL_CAPSULE | Freq: Two times a day (BID) | ORAL | Status: DC
Start: 2020-03-28 — End: 2020-03-28

## 2020-03-27 MED ORDER — TAMSULOSIN HCL 0.4 MG PO CAPS
0.4000 mg | ORAL_CAPSULE | Freq: Every day | ORAL | Status: DC
  Filled 2020-03-27: qty 1

## 2020-03-27 MED ORDER — FOLIC ACID 1 MG PO TABS
1.0000 mg | ORAL_TABLET | Freq: Every day | ORAL | Status: DC
  Administered 2020-03-27: 1 mg via ORAL
  Filled 2020-03-27: qty 1

## 2020-03-27 MED ORDER — CYCLOBENZAPRINE HCL 10 MG PO TABS
10.0000 mg | ORAL_TABLET | Freq: Every day | ORAL | Status: DC
  Administered 2020-03-27: 10 mg via ORAL
  Filled 2020-03-27: qty 1

## 2020-03-27 MED ORDER — HYDROMORPHONE HCL 1 MG/ML IJ SOLN
1.0000 mg | INTRAMUSCULAR | Status: DC | PRN
  Administered 2020-03-27: 1 mg via INTRAVENOUS
  Filled 2020-03-27 (×2): qty 1

## 2020-03-27 MED ORDER — MELATONIN 3 MG PO TABS
6.0000 mg | ORAL_TABLET | Freq: Every day | ORAL | Status: DC
  Administered 2020-03-27: 6 mg via ORAL
  Filled 2020-03-27: qty 2

## 2020-03-27 MED ORDER — HYDROMORPHONE HCL 2 MG PO TABS: 1.0000 mg | ORAL_TABLET | Freq: Four times a day (QID) | ORAL | 0 refills | Status: DC | PRN

## 2020-03-27 MED ORDER — METFORMIN HCL 500 MG PO TABS
500.0000 mg | ORAL_TABLET | Freq: Two times a day (BID) | ORAL | Status: DC
Start: 2020-03-28 — End: 2020-03-28

## 2020-03-27 MED ORDER — LISINOPRIL 20 MG PO TABS
20.0000 mg | ORAL_TABLET | Freq: Every day | ORAL | Status: DC
  Filled 2020-03-27: qty 1

## 2020-03-27 MED ORDER — TRAMADOL HCL 50 MG PO TABS
100.0000 mg | ORAL_TABLET | Freq: Every day | ORAL | Status: DC
Start: 2020-03-27 — End: 2020-03-28
  Administered 2020-03-27: 100 mg via ORAL
  Filled 2020-03-27: qty 2

## 2020-03-27 MED ORDER — LURASIDONE HCL 40 MG PO TABS: 40.0000 mg | ORAL_TABLET | Freq: Every day | ORAL | Status: DC

## 2020-03-27 MED ORDER — POLYVINYL ALCOHOL 1.4 % OP SOLN
1.0000 [drp] | Freq: Three times a day (TID) | OPHTHALMIC | Status: DC
  Filled 2020-03-27: qty 15

## 2020-03-27 MED ORDER — BUPROPION HCL ER (XL) 150 MG PO TB24
300.0000 mg | ORAL_TABLET | Freq: Every day | ORAL | Status: DC
  Filled 2020-03-27: qty 2

## 2020-03-27 MED ORDER — MYCOPHENOLATE MOFETIL 250 MG PO CAPS
1500.0000 mg | ORAL_CAPSULE | Freq: Two times a day (BID) | ORAL | Status: DC
  Filled 2020-03-27: qty 6

## 2020-03-27 MED ORDER — FLUTICASONE PROPIONATE 50 MCG/ACT NA SUSP
2.0000 | Freq: Every day | NASAL | Status: DC
  Filled 2020-03-27: qty 16

## 2020-03-27 MED ORDER — DOCUSATE SODIUM 100 MG PO CAPS
100.0000 mg | ORAL_CAPSULE | Freq: Every day | ORAL | Status: DC
  Filled 2020-03-27: qty 1

## 2020-03-27 NOTE — ED Triage Notes (Signed)
Patient presents with nerve pain in his right toe. He reports that if no one looks at his toe he will commit suicide. The patient told the triage RN that he had muscle relaxers he could take at home to accomplish this. Patient uses home O2 at night.   Patient is now sitting on the edge of the bed and refuses to lay down.   EMS vitals: 118 HR 155/98 BP 94% SPO2 on room air 121 CBG

## 2020-03-27 NOTE — ED Provider Notes (Addendum)
Mario [name] Provider Note   CSN: [id number] Arrival date & time: 03/27/20  1754     History Chief Complaint  Patient presents with  . Toe Pain  . Suicidal    Mario [name] is a 70 y.o. male.  HPI Patient with multiple medical issues including COPD, prior vasculitis presents via EMS with concern of new right great toe pain.  He notes that he had similar pain throughout the entirety of the right foot 6 years ago.  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  He started a prolonged course of chronic steroids, the last about 1 year.  Following that illness patient has had diminished sensation throughout the foot with chronic skin color changes.  He is unaware of a history of gout.  Today, without clear precipitant developed severe sharp pain focally in the right great toe.  Pain is worse with motion, palpation, but present with no provocation.  No proximal pain, no new skin color changes, no other complaints including fever, nausea, vomiting.  No relief with ibuprofen.    Past Medical History:  Diagnosis Date  . Anxiety   . Bronchitis   . COPD (chronic obstructive pulmonary disease) ([place])   . Depression   . Hypertension   . Mental disorder   . MI (myocardial infarction) ([place])    ????  . OSA (obstructive sleep apnea)   . Suicide attempt ([place])   . Tension pneumothorax 06/27/2016  . Uveitis     Patient Active Problem List   Diagnosis Date Noted  . Acute exacerbation of chronic obstructive pulmonary disease (COPD) ([place]) 01/05/2020  . Vertigo 12/26/2019  . 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 Stage 3a chronic kidney disease ([place]) 12/26/2019  . Abnormal CT of the chest 12/26/2019  . COPD (chronic obstructive pulmonary disease) ([place])   . AKI (acute kidney injury) ([place])   . Hyperglycemia 06/23/2019  . OSA (obstructive sleep apnea) 06/23/2019  . Suicidal ideations 06/23/2019  . Major depressive disorder, recurrent episode ([place]) 10/12/2018  .  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 Cellulitis of both feet 07/16/2017  . DM2 (diabetes mellitus, type 2) ([place]) 07/16/2017  . HLA B27 (HLA B27 positive) 07/16/2017  . Acute chest pain   . Hyperkalemia   . Spontaneous pneumothorax 06/27/2016  . Hypoxia   . Pneumothorax on left   . Dyspnea 05/23/2014  . COPD exacerbation ([place]) 05/23/2014  . Malingering 01/21/2013  . Depression 01/11/2013  . Tobacco abuse 01/11/2013  . Obesity, unspecified 01/11/2013  . Chest pain 01/10/2013  . HTN (hypertension) 01/10/2013    Past Surgical History:  Procedure Laterality Date  . CHEST TUBE INSERTION Left 06/27/2016  . cryptorchidism    . SKIN CANCER EXCISION         Family History  Problem Relation Age of Onset  . Dementia Father     Social History   Tobacco Use  . Smoking status: Former Smoker    Packs/day: 1.00    Years: 35.00    Pack years: 35.00    Types: Cigarettes    Quit date: 05/2016    Years since quitting: 3.8  . Smokeless tobacco: Never Used  Vaping Use  . Vaping Use: Never used  Substance Use Topics  . Alcohol use: No    Alcohol/week: 0.0 standard drinks    Comment: denies use of any drugs or alcohol  . Drug use: No    Home Medications Prior to Admission  medications   Medication Sig Start Date End Date Taking? Authorizing Provider  b complex vitamins capsule Take 1 capsule by mouth daily.    [provider]  buPROPion (WELLBUTRIN XL) 300 MG 24 hr tablet Take 300 mg by mouth daily.     [provider]  busPIRone (BUSPAR) 15 MG tablet Take 15 mg by mouth daily.     [provider]  calcium-vitamin D (OSCAL WITH D) 500-200 MG-UNIT tablet Take 1 tablet by mouth 2 (two) times daily with a meal.    [provider]  Cholecalciferol (VITAMIN D-3) 25 MCG (1000 UT) CAPS Take 1,000 Units by mouth daily.    [provider]  cyclobenzaprine (FLEXERIL) 10 MG tablet Take 10 mg by mouth at  bedtime.    [provider]  diltiazem (CARDIZEM CD) 180 MG 24 hr capsule Take 180 mg by mouth daily.    [provider]  docusate calcium (SURFAK) 240 MG capsule Take 240 mg by mouth daily.    [provider]  DULoxetine (CYMBALTA) 60 MG capsule Take 1 capsule (60 mg total) by mouth 2 (two) times daily. Patient taking differently: Take 60 mg by mouth 2 (two) times daily with a meal. 07/21/17   Mario Hamman, MD  etanercept (ENBREL) 50 MG/ML injection Inject 50 mg into the skin every Wednesday.    [provider]  fluticasone (FLONASE) 50 MCG/ACT nasal spray Place 2 sprays into both nostrils daily.     [provider]  folic acid (FOLVITE) 1 MG tablet Take 1 mg by mouth daily.    [provider]  gabapentin (NEURONTIN) 300 MG capsule Take 2 capsules (600 mg total) by mouth 2 (two) times daily. Patient taking differently: Take 900 mg by mouth at bedtime. 07/06/16   Lars Pinks M, PA-C  insulin glargine (LANTUS) 100 UNIT/ML injection Inject 12 Units into the skin at bedtime.    [provider]  lisinopril (ZESTRIL) 20 MG tablet Take 20 mg by mouth daily.    [provider]  lurasidone (LATUDA) 40 MG TABS tablet Take 40 mg by mouth daily after supper.    [provider]  Melatonin 3 MG TABS Take 6 mg by mouth at bedtime.    [provider]  metFORMIN (GLUCOPHAGE) 500 MG tablet Take 500 mg by mouth 2 (two) times daily with a meal.    [provider]  methotrexate (RHEUMATREX) 2.5 MG tablet Take 6 tablets (15 mg total) by mouth every Saturday. Hold until seen by rheumatology  Caution:Chemotherapy. Protect from light. Patient taking differently: Take 15 mg by mouth every Saturday. 07/22/17   Mario Hamman, MD  Multiple Vitamin (MULTIVITAMIN WITH MINERALS) TABS tablet Take 1 tablet by mouth daily.    [provider]  mycophenolate (CELLCEPT) 250 MG capsule Take 1,500 mg by mouth 2 (two)  times daily.    [provider]  nitroGLYCERIN (NITROSTAT) 0.4 MG SL tablet Place 1 tablet (0.4 mg total) under the tongue every 5 (five) minutes as needed for chest pain. 07/03/19   Mario [name], Suann Larry, MD  OVER THE COUNTER MEDICATION Take 1 tablet by mouth at bedtime. ZZZquil Pure ZZZ    [provider]  Oxcarbazepine (TRILEPTAL) 300 MG tablet Take 600 mg by mouth 4 (four) times daily as needed (nerve pain).    [provider]  polyethylene glycol (MIRALAX / GLYCOLAX) 17 g packet Take 17 g by mouth daily.    [provider]  PRESCRIPTION MEDICATION Inhale into the lungs See admin instructions. CPAP- At bedtime and during during any naps    [provider]  Propylene Glycol (SYSTANE BALANCE) 0.6 % SOLN Place 1 drop into both eyes in the morning, at noon, and at bedtime.     [provider]  sodium chloride (OCEAN) 0.65 % SOLN nasal spray Place 2 sprays into both nostrils 4 (four) times daily as needed for congestion.    [provider]  tamsulosin (FLOMAX) 0.4 MG CAPS capsule Take 0.4 mg by mouth daily.    [provider]  Tiotropium Bromide-Olodaterol (STIOLTO RESPIMAT) 2.5-2.5 MCG/ACT AERS Inhale 2 puffs into the lungs daily.    [provider]  traMADol (ULTRAM) 50 MG tablet Take 100 mg by mouth at bedtime.    [provider]  traZODone (DESYREL) 50 MG tablet Take 75 mg by mouth at bedtime.    [provider]  VITAMIN A PO Take 1 tablet by mouth daily.    [provider]  vitamin C (ASCORBIC ACID) 500 MG tablet Take 500 mg by mouth 2 (two) times daily.     [provider]    Allergies    Demerol [meperidine], Zocor [simvastatin], Beet [beta vulgaris], and Liver  Review of Systems   Review of Systems  Constitutional:       Per HPI, otherwise negative  HENT:       Per HPI, otherwise negative  Respiratory:       Per HPI, otherwise negative  Cardiovascular:       Per HPI,  otherwise negative  Gastrointestinal: Negative for vomiting.  Endocrine:       Negative aside from HPI  Genitourinary:       Neg aside from HPI   Musculoskeletal:       Per HPI, otherwise negative  Skin: Positive for color change.  Neurological: Negative for syncope and weakness.  Psychiatric/Behavioral:       Patient clarifies that the pain in his foot is so bad that he would be suicidal if it is not addressed, he has not uniquely suicidal.    Physical Exam Updated Vital Signs There were no vitals taken for this visit.  Physical Exam Vitals and nursing note reviewed.  Constitutional:      General: He is not in acute distress.    Appearance: He is well-developed. He is obese.  HENT:     Head: Normocephalic and atraumatic.  Eyes:     Extraocular Movements: EOM normal.     Conjunctiva/sclera: Conjunctivae normal.  Cardiovascular:     Rate and Rhythm: Normal rate and regular rhythm.     Pulses: Normal pulses.  Pulmonary:     Effort: Pulmonary effort is normal. No respiratory distress.     Breath sounds: No stridor.  Abdominal:     General: There is no distension.  Musculoskeletal:        General: No edema.       Legs:  Skin:    General: Skin is warm and dry.  Neurological:     Mental Status: He is alert and oriented to person, place, and time.     Comments: Sensation in the foot, ankle diminished, though he can discern it.  Patient denies changes from baseline.  Plantar reflex appropriate.  Psychiatric:        Mood and Affect: Mood and affect and mood normal.        Behavior: Behavior normal.     ED Results /  Procedures / Treatments   Labs (all labs ordered are listed, but only abnormal results are displayed) Labs Reviewed  BASIC METABOLIC PANEL - Abnormal; Notable for the following components:      Result Value   Glucose, Bld 138 (*)    All other components within normal limits  CBC WITH DIFFERENTIAL/PLATELET - Abnormal; Notable for the following components:    WBC 10.9 (*)    All other components within normal limits  URIC ACID    EKG None  Radiology DG Toe Great Right  Result Date: 03/27/2020 CLINICAL DATA:  Pain EXAM: RIGHT GREAT TOE COMPARISON:  None. FINDINGS: There are mild degenerative changes of the first metatarsophalangeal joint and interphalangeal joint. These appear to be stable since the prior study. There are no convincing erosions. No acute displaced fracture or dislocation. IMPRESSION: Mild degenerative changes are noted of the first digit, not substantially changed from prior study. No definite radiographic evidence to suggest an underlying diagnosis of gout. Electronically Signed   By: Constance Holster M.D.   On: 03/27/2020 19:13    Procedures Procedures   Medications Ordered in ED Medications  HYDROmorphone (DILAUDID) injection 1 mg (has no administration in time range)  dexamethasone (DECADRON) injection 10 mg (has no administration in time range)    ED Course  I have reviewed the triage vital signs and the nursing notes.  Pertinent labs & imaging results that were available during my care of the patient were reviewed by me and considered in my medical decision making (see chart for details).    9:14 PM Patient in no distress, awake and alert.  He continues to complain of pain in his toe.  He notes that should his pain recur he may feel suicidal, but otherwise denies suicidal ideation, there is low suspicion for true suicidal intent. He does confirm that he had suicide attempts 8 years ago after he lost his job, but seemingly none since that time. 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 Patient has had some relief with intramuscular Dilaudid, initiation of steroids, and likely appropriate for ongoing outpatient management. However, the patient notes that he  has no resources, and typically receives his medications from the [facility], but cannot travel there to get them either. I placed a consult for social workers to assist for medication assistance, and transportation. Patient's home medications have been written for today, and prescriptions have been sent to the pharmacy already.  10:40 PM I discussed patient case with her social work Medical laboratory scientific officer.  Arrangements have been made for transportation, and meds to be sent to pharmacy with assistance, patient will be able to pick those up tomorrow. Without other complaints, suspicion for neuropathic pain, no evidence for septic arthritis, bacteremia, sepsis, and have to patient specifies his statements of suicidal intent only related to difficulty with pain control, is discharged in stable condition to follow-up with his outpatient providers, obtain his medications in the morning.  Final Clinical Impression(s) / ED Diagnoses Final diagnoses:  Pain of right great toe     Carmin Muskrat, MD 03/27/20 2116    Carmin Muskrat, MD 03/27/20 2241

## 2020-03-27 NOTE — Discharge Instructions (Signed)
Prescriptions have been sent to the [facility] in [place].  Please obtain these as soon as possible.  In addition to following up with your physicians there, be sure to keep your primary care visit appointment next week.

## 2020-03-27 NOTE — Care Management (Signed)
ED RN Care Manager received call from SW concerning medication assistance.  Patient is active with the [facility] in [place], but has had  transportation issues.  Spoke with patient he states, since [place] transportation has been suspended. Patient has not been able to keep his appt. Offered assisting with finding a local patient declined, states he likes his PCP at the [facility].  Discussed having EDP send prescriptions to a pharmacy he request they be sent [facility] on [address] in [place].  RNCM will contact [facility] concerning delivery of meds and f/u with patient.  Patient will be transported home via [employer].  Updated EDP on discharge plan.

## 2020-03-27 NOTE — ED Triage Notes (Signed)
EMS reported that the patient stated "If I can't gaet my toe looked at then I will be suicidal." Writer questioned the patient about being suicidal and patient repeated that if he could not get his foot fixed he would be suicidal." Patient then said, "I have a bottle of muscle relaxers that I can take if I need too."

## 2020-03-27 NOTE — ED Notes (Signed)
Discharged with no concerns at this time. Patient given a cab voucher.

## 2020-03-30 ENCOUNTER — Emergency Department (EMERGENCY_DEPARTMENT_HOSPITAL)
Admission: EM | Admit: 2020-03-30 | Discharge: 2020-04-01 | Disposition: A | Discharge status: Home / Self Care | Payer: No Typology Code available for payment source | Source: Home / Self Care | Attending: Emergency Medicine | Admitting: Emergency Medicine

## 2020-03-30 ENCOUNTER — Other Ambulatory Visit: Payer: Self-pay

## 2020-03-30 ENCOUNTER — Encounter (HOSPITAL_COMMUNITY): Payer: Self-pay | Admitting: Obstetrics and Gynecology

## 2020-03-30 ENCOUNTER — Emergency Department (HOSPITAL_COMMUNITY): Payer: No Typology Code available for payment source

## 2020-03-30 DIAGNOSIS — R000 Tachycardia, unspecified: Secondary | ICD-10-CM | POA: Insufficient documentation

## 2020-03-30 DIAGNOSIS — F32A Depression, unspecified: Secondary | ICD-10-CM | POA: Diagnosis not present

## 2020-03-30 DIAGNOSIS — J441 Chronic obstructive pulmonary disease with (acute) exacerbation: Secondary | ICD-10-CM | POA: Insufficient documentation

## 2020-03-30 DIAGNOSIS — Z79899 Other long term (current) drug therapy: Secondary | ICD-10-CM | POA: Insufficient documentation

## 2020-03-30 DIAGNOSIS — Z20822 Contact with and (suspected) exposure to covid-19: Secondary | ICD-10-CM | POA: Insufficient documentation

## 2020-03-30 DIAGNOSIS — F329 Major depressive disorder, single episode, unspecified: Secondary | ICD-10-CM | POA: Insufficient documentation

## 2020-03-30 DIAGNOSIS — I129 Hypertensive chronic kidney disease with stage 1 through stage 4 chronic kidney disease, or unspecified chronic kidney disease: Secondary | ICD-10-CM | POA: Insufficient documentation

## 2020-03-30 DIAGNOSIS — Z7952 Long term (current) use of systemic steroids: Secondary | ICD-10-CM | POA: Insufficient documentation

## 2020-03-30 DIAGNOSIS — J189 Pneumonia, unspecified organism: Secondary | ICD-10-CM | POA: Diagnosis not present

## 2020-03-30 DIAGNOSIS — R45851 Suicidal ideations: Secondary | ICD-10-CM

## 2020-03-30 DIAGNOSIS — Z7984 Long term (current) use of oral hypoglycemic drugs: Secondary | ICD-10-CM | POA: Insufficient documentation

## 2020-03-30 DIAGNOSIS — N1831 Chronic kidney disease, stage 3a: Secondary | ICD-10-CM | POA: Insufficient documentation

## 2020-03-30 DIAGNOSIS — E1122 Type 2 diabetes mellitus with diabetic chronic kidney disease: Secondary | ICD-10-CM | POA: Insufficient documentation

## 2020-03-30 DIAGNOSIS — Z87891 Personal history of nicotine dependence: Secondary | ICD-10-CM | POA: Insufficient documentation

## 2020-03-30 LAB — COMPREHENSIVE METABOLIC PANEL
ALT: 29 U/L (ref 0–44)
AST: 25 U/L (ref 15–41)
Albumin: 4.1 g/dL (ref 3.5–5.0)
Alkaline Phosphatase: 51 U/L (ref 38–126)
Anion gap: 14 (ref 5–15)
BUN: 19 mg/dL (ref 8–23)
CO2: 21 mmol/L — ABNORMAL LOW (ref 22–32)
Calcium: 9.2 mg/dL (ref 8.9–10.3)
Chloride: 103 mmol/L (ref 98–111)
Creatinine, Ser: 1.08 mg/dL (ref 0.61–1.24)
GFR, Estimated: >60 mL/min (ref >60)
Glucose, Bld: 116 mg/dL — ABNORMAL HIGH (ref 70–99)
Potassium: 4.3 mmol/L (ref 3.5–5.1)
Sodium: 138 mmol/L (ref 135–145)
Total Bilirubin: 0.3 mg/dL (ref 0.3–1.2)
Total Protein: 6.7 g/dL (ref 6.5–8.1)

## 2020-03-30 LAB — BRAIN NATRIURETIC PEPTIDE: B Natriuretic Peptide: 57.3 pg/mL (ref 0.0–100.0)

## 2020-03-30 LAB — RAPID URINE DRUG SCREEN, HOSP PERFORMED
Amphetamines: NOT DETECTED
Barbiturates: NOT DETECTED
Benzodiazepines: NOT DETECTED
Cocaine: NOT DETECTED
Opiates: NOT DETECTED
Tetrahydrocannabinol: NOT DETECTED

## 2020-03-30 LAB — CBC
HCT: 46.5 % (ref 39.0–52.0)
Hemoglobin: 15.2 g/dL (ref 13.0–17.0)
MCH: 32.8 pg (ref 26.0–34.0)
MCHC: 32.7 g/dL (ref 30.0–36.0)
MCV: 100.4 fL — ABNORMAL HIGH (ref 80.0–100.0)
Platelets: 302 10*3/uL (ref 150–400)
RBC: 4.63 MIL/uL (ref 4.22–5.81)
RDW: 13.3 % (ref 11.5–15.5)
WBC: 13.2 10*3/uL — ABNORMAL HIGH (ref 4.0–10.5)
nRBC: 0 % (ref 0.0–0.2)

## 2020-03-30 LAB — TROPONIN I (HIGH SENSITIVITY)
Troponin I (High Sensitivity): 5 ng/L (ref <18)
Troponin I (High Sensitivity): 5 ng/L (ref <18)

## 2020-03-30 LAB — SARS CORONAVIRUS 2 BY RT PCR (HOSPITAL ORDER, PERFORMED IN ~~LOC~~ HOSPITAL LAB): SARS Coronavirus 2: NEGATIVE

## 2020-03-30 MED ORDER — DOCUSATE SODIUM 100 MG PO CAPS
100.0000 mg | ORAL_CAPSULE | Freq: Every day | ORAL | Status: DC
  Administered 2020-03-31 – 2020-04-01 (×2): 100 mg via ORAL
  Filled 2020-03-30 (×2): qty 1

## 2020-03-30 MED ORDER — LURASIDONE HCL 40 MG PO TABS
40.0000 mg | ORAL_TABLET | Freq: Every day | ORAL | Status: DC
  Administered 2020-03-31: 40 mg via ORAL
  Filled 2020-03-30 (×3): qty 1

## 2020-03-30 MED ORDER — METFORMIN HCL 500 MG PO TABS
500.0000 mg | ORAL_TABLET | Freq: Two times a day (BID) | ORAL | Status: DC
  Administered 2020-03-31 – 2020-04-01 (×3): 500 mg via ORAL
  Filled 2020-03-30 (×3): qty 1

## 2020-03-30 MED ORDER — ACETAMINOPHEN 325 MG PO TABS
650.0000 mg | ORAL_TABLET | Freq: Once | ORAL | Status: AC
  Administered 2020-03-30: 650 mg via ORAL
  Filled 2020-03-30: qty 2

## 2020-03-30 MED ORDER — DULOXETINE HCL 30 MG PO CPEP
60.0000 mg | ORAL_CAPSULE | Freq: Two times a day (BID) | ORAL | Status: DC
Start: 2020-03-31 — End: 2020-04-01
  Administered 2020-03-31 – 2020-04-01 (×3): 60 mg via ORAL
  Filled 2020-03-30 (×3): qty 2

## 2020-03-30 MED ORDER — BUPROPION HCL ER (XL) 150 MG PO TB24
300.0000 mg | ORAL_TABLET | Freq: Every day | ORAL | Status: DC
  Administered 2020-03-31 – 2020-04-01 (×2): 300 mg via ORAL
  Filled 2020-03-30 (×2): qty 2

## 2020-03-30 MED ORDER — OXCARBAZEPINE 300 MG PO TABS
600.0000 mg | ORAL_TABLET | Freq: Three times a day (TID) | ORAL | Status: DC
  Administered 2020-03-30 – 2020-04-01 (×5): 600 mg via ORAL
  Filled 2020-03-30 (×5): qty 2

## 2020-03-30 MED ORDER — MYCOPHENOLATE MOFETIL 250 MG PO CAPS
1500.0000 mg | ORAL_CAPSULE | Freq: Two times a day (BID) | ORAL | Status: DC
  Filled 2020-03-30: qty 6

## 2020-03-30 MED ORDER — IPRATROPIUM-ALBUTEROL 0.5-2.5 (3) MG/3ML IN SOLN
3.0000 mL | Freq: Once | RESPIRATORY_TRACT | Status: AC
  Administered 2020-03-30: 3 mL via RESPIRATORY_TRACT
  Filled 2020-03-30: qty 3

## 2020-03-30 MED ORDER — MYCOPHENOLATE MOFETIL 250 MG PO CAPS
1500.0000 mg | ORAL_CAPSULE | Freq: Two times a day (BID) | ORAL | Status: DC
  Administered 2020-03-31 – 2020-04-01 (×3): 1500 mg via ORAL
  Filled 2020-03-30 (×5): qty 6

## 2020-03-30 MED ORDER — DILTIAZEM HCL ER COATED BEADS 180 MG PO CP24
180.0000 mg | ORAL_CAPSULE | Freq: Every day | ORAL | Status: DC
  Administered 2020-03-31 – 2020-04-01 (×2): 180 mg via ORAL
  Filled 2020-03-30 (×2): qty 1

## 2020-03-30 MED ORDER — METHYLPREDNISOLONE SODIUM SUCC 125 MG IJ SOLR
125.0000 mg | Freq: Once | INTRAMUSCULAR | Status: AC
  Administered 2020-03-30: 125 mg via INTRAVENOUS
  Filled 2020-03-30: qty 2

## 2020-03-30 MED ORDER — GABAPENTIN 300 MG PO CAPS
900.0000 mg | ORAL_CAPSULE | Freq: Every day | ORAL | Status: DC
  Administered 2020-03-30 – 2020-03-31 (×2): 900 mg via ORAL
  Filled 2020-03-30 (×2): qty 3

## 2020-03-30 MED ORDER — CYCLOBENZAPRINE HCL 10 MG PO TABS
10.0000 mg | ORAL_TABLET | Freq: Every day | ORAL | Status: DC
Start: 2020-03-30 — End: 2020-04-01
  Administered 2020-03-30 – 2020-03-31 (×2): 10 mg via ORAL
  Filled 2020-03-30 (×2): qty 1

## 2020-03-30 MED ORDER — HYDROMORPHONE HCL 2 MG PO TABS
1.0000 mg | ORAL_TABLET | Freq: Four times a day (QID) | ORAL | Status: DC | PRN
  Administered 2020-03-30 – 2020-04-01 (×5): 1 mg via ORAL
  Filled 2020-03-30 (×5): qty 1

## 2020-03-30 NOTE — ED Notes (Signed)
Pt is dressed out into burgundy scrubs and a large hospital gown as a top. There are two bags of pt belongings located in the  "Patient Belongings 19-22" cabinet. Pt's walking cane is located at nurses station below these cabinets.   Pt was wanded by security.

## 2020-03-30 NOTE — ED Notes (Signed)
Wallet and cell phone locked up with security.

## 2020-03-30 NOTE — ED Notes (Signed)
Pt's O2 sats consistently below 87%. Increased O2 to 4 L via nasal cannula. O2 sats now 95%

## 2020-03-30 NOTE — ED Notes (Signed)
Pt ambulatory to room from EMS stretcher without staff assistance. However pt does use cane.

## 2020-03-30 NOTE — ED Provider Notes (Signed)
[facility] DEPT Provider Note   CSN: [id number] Arrival date & time: 03/30/20  1837     History Chief Complaint  Patient presents with  . Shortness of Breath    Mario [name] is a 70 y.o. male.  Patient with history of chronic pain, COPD, depression presents to ER chief complaint of shortness of breath and thoughts of suicide.  He was seen here in the ER 3 days ago for medical evaluation and at that time had threatened thoughts of self-harm but had stated that he only had these thoughts because of his physical discomfort.  Patient's history is consistent with suicide attempt in the past.  He states that he has been having persistent thoughts of self-harm for the past 2 days since he has been home.  He developed worsening shortness of breath and presented to the ER.  Denies any headache.  Denies chest pain fevers or cough.  On 2 L nasal cannula at baseline.        Past Medical History:  Diagnosis Date  . Anxiety   . Bronchitis   . COPD (chronic obstructive pulmonary disease) ([place])   . Depression   . Hypertension   . Mental disorder   . MI (myocardial infarction) ([place])    ????  . OSA (obstructive sleep apnea)   . Suicide attempt ([place])   . Tension pneumothorax 06/27/2016  . Uveitis     Patient Active Problem List   Diagnosis Date Noted  . Acute exacerbation of chronic obstructive pulmonary disease (COPD) ([place]) 01/05/2020  . Vertigo 12/26/2019  . 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 Stage 3a chronic kidney disease ([place]) 12/26/2019  . Abnormal CT of the chest 12/26/2019  . COPD (chronic obstructive pulmonary disease) ([place])   . AKI (acute kidney injury) ([place])   . Hyperglycemia 06/23/2019  . OSA (obstructive sleep apnea) 06/23/2019  . Suicidal ideations 06/23/2019  . Major depressive disorder, recurrent episode ([place]) 10/12/2018  . 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 Cellulitis of both feet  07/16/2017  . DM2 (diabetes mellitus, type 2) ([place]) 07/16/2017  . HLA B27 (HLA B27 positive) 07/16/2017  . Acute chest pain   . Hyperkalemia   . Spontaneous pneumothorax 06/27/2016  . Hypoxia   . Pneumothorax on left   . Dyspnea 05/23/2014  . COPD exacerbation ([place]) 05/23/2014  . Malingering 01/21/2013  . Depression 01/11/2013  . Tobacco abuse 01/11/2013  . Obesity, unspecified 01/11/2013  . Chest pain 01/10/2013  . HTN (hypertension) 01/10/2013    Past Surgical History:  Procedure Laterality Date  . CHEST TUBE INSERTION Left 06/27/2016  . cryptorchidism    . SKIN CANCER EXCISION         Family History  Problem Relation Age of Onset  . Dementia Father     Social History   Tobacco Use  . Smoking status: Former Smoker    Packs/day: 1.00    Years: 35.00    Pack years: 35.00    Types: Cigarettes    Quit date: 05/2016    Years since quitting: 3.8  . Smokeless tobacco: Never Used  Vaping Use  . Vaping Use: Never used  Substance Use Topics  . Alcohol use: No    Alcohol/week: 0.0 standard drinks    Comment: denies use of any drugs or alcohol  . Drug use: No    Home Medications Prior to Admission medications   Medication Sig Start Date End Date Taking? Authorizing Provider  acetaminophen (TYLENOL) 500 MG tablet Take 1,000 mg by mouth every 6 (six) hours as needed for moderate pain.   Yes [provider]  b complex vitamins capsule Take 1 capsule by mouth daily.   Yes [provider]  buPROPion (WELLBUTRIN XL) 300 MG 24 hr tablet Take 300 mg by mouth daily.    Yes [provider]  busPIRone (BUSPAR) 15 MG tablet Take 15 mg by mouth daily.    Yes [provider]  calcium-vitamin D (OSCAL WITH D) 500-200 MG-UNIT tablet Take 1 tablet by mouth 2 (two) times daily with a meal.   Yes [provider]  Cholecalciferol (VITAMIN D-3) 25 MCG (1000 UT) CAPS Take 1,000 Units by mouth daily.   Yes [provider]   cyclobenzaprine (FLEXERIL) 10 MG tablet Take 10 mg by mouth at bedtime.   Yes [provider]  diltiazem (CARDIZEM CD) 180 MG 24 hr capsule Take 180 mg by mouth daily.   Yes [provider]  docusate calcium (SURFAK) 240 MG capsule Take 240 mg by mouth daily.   Yes [provider]  DULoxetine (CYMBALTA) 60 MG capsule Take 1 capsule (60 mg total) by mouth 2 (two) times daily. Patient taking differently: Take 60 mg by mouth 2 (two) times daily with a meal. 07/21/17  Yes Rolly Salter, MD  etanercept (ENBREL) 50 MG/ML injection Inject 50 mg into the skin every Wednesday.   Yes [provider]  fluticasone (FLONASE) 50 MCG/ACT nasal spray Place 2 sprays into both nostrils daily.    Yes [provider]  folic acid (FOLVITE) 1 MG tablet Take 1 mg by mouth daily.   Yes [provider]  gabapentin (NEURONTIN) 300 MG capsule Take 2 capsules (600 mg total) by mouth 2 (two) times daily. Patient taking differently: Take 900 mg by mouth at bedtime. 07/06/16  Yes Doree Fudge M, PA-C  HYDROmorphone (DILAUDID) 2 MG tablet Take 0.5 tablets (1 mg total) by mouth every 6 (six) hours as needed for severe pain. 03/27/20  Yes Gerhard Munch, MD  lisinopril (ZESTRIL) 20 MG tablet Take 20 mg by mouth daily.   Yes [provider]  lurasidone (LATUDA) 40 MG TABS tablet Take 40 mg by mouth daily after supper.   Yes [provider]  Melatonin 3 MG TABS Take 6 mg by mouth at bedtime.   Yes [provider]  metFORMIN (GLUCOPHAGE) 500 MG tablet Take 500 mg by mouth 2 (two) times daily with a meal.   Yes [provider]  methotrexate (RHEUMATREX) 2.5 MG tablet Take 6 tablets (15 mg total) by mouth every Saturday. Hold until seen by rheumatology  Caution:Chemotherapy. Protect from light. Patient taking differently: Take 15 mg by mouth every Saturday. 07/22/17  Yes Rolly Salter, MD  Multiple Vitamin (MULTIVITAMIN WITH MINERALS)  TABS tablet Take 1 tablet by mouth daily.   Yes [provider]  mycophenolate (CELLCEPT) 250 MG capsule Take 1,500 mg by mouth 2 (two) times daily.   Yes [provider]  nitroGLYCERIN (NITROSTAT) 0.4 MG SL tablet Place 1 tablet (0.4 mg total) under the tongue every 5 (five) minutes as needed for chest pain. 07/03/19  Yes Danford, Earl Lites, MD  OVER THE COUNTER MEDICATION Place 1 drop into both ears daily. Miracell ProEar for irritated itchy ears   Yes [provider]  OVER THE COUNTER MEDICATION Take 2 tablets by mouth daily. Pure Zzz   Yes [provider]  Oxcarbazepine (TRILEPTAL) 300  MG tablet Take 600 mg by mouth 3 (three) times daily.   Yes [provider]  polyethylene glycol (MIRALAX / GLYCOLAX) 17 g packet Take 17 g by mouth daily.   Yes [provider]  Propylene Glycol (SYSTANE BALANCE) 0.6 % SOLN Place 1 drop into both eyes in the morning, at noon, and at bedtime.    Yes [provider]  sodium chloride (OCEAN) 0.65 % SOLN nasal spray Place 2 sprays into both nostrils 4 (four) times daily as needed for congestion.   Yes [provider]  tamsulosin (FLOMAX) 0.4 MG CAPS capsule Take 0.4 mg by mouth daily.   Yes [provider]  Tiotropium Bromide-Olodaterol (STIOLTO RESPIMAT) 2.5-2.5 MCG/ACT AERS Inhale 2 puffs into the lungs daily.   Yes [provider]  traMADol (ULTRAM) 50 MG tablet Take 100 mg by mouth at bedtime.   Yes [provider]  traZODone (DESYREL) 50 MG tablet Take 75 mg by mouth at bedtime.   Yes [provider]  VITAMIN A PO Take 1 tablet by mouth daily.   Yes [provider]  vitamin C (ASCORBIC ACID) 500 MG tablet Take 500 mg by mouth 2 (two) times daily.    Yes [provider]  predniSONE (DELTASONE) 20 MG tablet Take 2 tablets (40 mg total) by mouth daily with breakfast. For the next four days Patient not taking: No sig reported 03/27/20    Carmin Muskrat, MD  PRESCRIPTION MEDICATION Inhale into the lungs See admin instructions. CPAP- At bedtime and during during any naps    [provider]    Allergies    Demerol [meperidine], Zocor [simvastatin], Beet [beta vulgaris], and Liver  Review of Systems   Review of Systems  Constitutional: Negative for fever.  HENT: Negative for ear pain and sore throat.   Eyes: Negative for pain.  Respiratory: Positive for shortness of breath. Negative for cough.   Cardiovascular: Negative for chest pain.  Gastrointestinal: Negative for abdominal pain.  Genitourinary: Negative for flank pain.  Musculoskeletal: Negative for back pain.  Skin: Negative for color change and rash.  Neurological: Negative for syncope.  Psychiatric/Behavioral: Positive for suicidal ideas.  All other systems reviewed and are negative.   Physical Exam Updated Vital Signs BP (!) 153/85 (BP Location: Left Arm)   Pulse 88   Temp 97.9 F (36.6 C) (Oral)   Resp (!) 22   Ht _0  (1.88 m)   Wt (!) 149.7 kg   SpO2 93%   BMI 42.37 kg/m   Physical Exam Constitutional:      General: He is not in acute distress.    Appearance: He is well-developed.  HENT:     Head: Normocephalic.     Nose: Nose normal.  Eyes:     Extraocular Movements: Extraocular movements intact.  Cardiovascular:     Rate and Rhythm: Normal rate.  Pulmonary:     Effort: Tachypnea present.     Breath sounds: No wheezing.  Skin:    Coloration: Skin is not jaundiced.  Neurological:     Mental Status: He is alert. Mental status is at baseline.     ED Results / Procedures / Treatments   Labs (all labs ordered are listed, but only abnormal results are displayed) Labs Reviewed  CBC - Abnormal; Notable for the following components:      Result Value   WBC 13.2 (*)    MCV 100.4 (*)    All other components within normal limits  COMPREHENSIVE METABOLIC PANEL - Abnormal; Notable for the following components:   CO2 21 (*)     Glucose, Bld 116 (*)    All other components within normal limits  SARS CORONAVIRUS 2 BY RT PCR (HOSPITAL ORDER, [facility])  BRAIN NATRIURETIC PEPTIDE  RAPID URINE DRUG SCREEN, HOSP PERFORMED  TROPONIN I (HIGH SENSITIVITY)  TROPONIN I (HIGH SENSITIVITY)    EKG EKG Interpretation  Date/Time:  Monday March 30 2020 19:05:38 EST Ventricular Rate:  101 PR Interval:    QRS Duration: 110 QT Interval:  343 QTC Calculation: 445 R Axis:   6 Text Interpretation: Sinus tachycardia Confirmed by Thamas Jaegers ([id number]) on 03/30/2020 7:26:40 PM   Radiology DG Chest 2 View  Result Date: 03/30/2020 CLINICAL DATA:  Shortness of breath. EXAM: CHEST - 2 VIEW COMPARISON:  March 21, 2020 FINDINGS: Again noted are emphysematous changes bilaterally. There are chronic appearing opacities at the lung bases similar to prior study. There is no pneumothorax. No large pleural effusion. There is no definite acute osseous abnormality. IMPRESSION: No active cardiopulmonary disease. Chronic changes as detailed above, not substantially changed from prior study. Electronically Signed   By: Constance Holster M.D.   On: 03/30/2020 20:26    Procedures Procedures   Medications Ordered in ED Medications  ipratropium-albuterol (DUONEB) 0.5-2.5 (3) MG/3ML nebulizer solution 3 mL (has no administration in time range)  buPROPion (WELLBUTRIN XL) 24 hr tablet 300 mg (has no administration in time range)  cyclobenzaprine (FLEXERIL) tablet 10 mg (has no administration in time range)  diltiazem (CARDIZEM CD) 24 hr capsule 180 mg (has no administration in time range)  docusate sodium (COLACE) capsule 100 mg (has no administration in time range)  DULoxetine (CYMBALTA) DR capsule 60 mg (has no administration in time range)  gabapentin (NEURONTIN) capsule 900 mg (has no administration in time range)  HYDROmorphone (DILAUDID) tablet 1 mg (has no administration in time range)  lurasidone (LATUDA) tablet  40 mg (has no administration in time range)  metFORMIN (GLUCOPHAGE) tablet 500 mg (has no administration in time range)  Oxcarbazepine (TRILEPTAL) tablet 600 mg (has no administration in time range)  mycophenolate (CELLCEPT) capsule 1,500 mg (has no administration in time range)  acetaminophen (TYLENOL) tablet 650 mg (650 mg Oral Given 03/30/20 2200)  methylPREDNISolone sodium succinate (SOLU-MEDROL) 125 mg/2 mL injection 125 mg (125 mg Intravenous Given 03/30/20 2238)    ED Course  I have reviewed the triage vital signs and the nursing notes.  Pertinent labs & imaging results that were available during my care of the patient were reviewed by me and considered in my medical decision making (see chart for details).    MDM Rules/Calculators/A&P                          2 sets of troponin were sent are both unremarkable troponin sent and unremarkable.  Labs otherwise normal limits.  Patient satting about 90 to 93% on room air which is appropriate.  proBNP is negative as well.  Given the breathing treatment and Solu-Medrol IV.  From prior imaging.  Suicidal thoughts, the patient states that he is tired of living and all this pain.  He has an active plan of overdosing on his medications. consultation with behavioral team requested.     Final Clinical Impression(s) / ED Diagnoses Final diagnoses:  COPD exacerbation ([place])  Depression, unspecified depression type    Rx / DC Orders ED Discharge Orders  None       Luna Fuse, MD 03/30/20 2240
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
## 2020-03-31 LAB — CBG MONITORING, ED
Glucose-Capillary: 230 mg/dL — ABNORMAL HIGH (ref 70–99)
Glucose-Capillary: 168 mg/dL — ABNORMAL HIGH (ref 70–99)
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
## 2020-03-31 NOTE — BH Assessment (Signed)
Lindon Romp, NP, patient meets inpatient criteria. Geropsychiatry recommended. Disposition SW to secure placement in AM.

## 2020-03-31 NOTE — BH Assessment (Signed)
[place] Assessment Progress Note   Per Shuvon Rankin, NP, this voluntary pt requires psychiatric hospitalization at this time.  The following facilities have been contacted to seek placement for this pt, with results as noted:  Beds available, information sent, decision pending: [facility]   Unable to reach: [facility] (left message at 15:39)  Declined: [facility] (O2 is exclusionary) [facility] (O2 is exclusionary) [facility] [place] (O2 is exclusionary) [facility] (O2 is exclusionary)  At capacity: [facility] system  Unit currently closed: [facility] (closed permanently)  If this voluntary pt is accepted to a facility, please discuss disposition with pt to be sure that they agree to the plan.  If a facility agrees to accept pt and the plan changes in any way please call the facility to inform them of the change.  Final disposition is pending as of this writing.  Jalene Mullet, [place] Coordinator [phone]

## 2020-03-31 NOTE — ED Notes (Signed)
Brought pt food trey 

## 2020-03-31 NOTE — ED Notes (Signed)
Family stop by with phone charger for PT which I put in pt's bag located in cabinet 19-22

## 2020-04-01 DIAGNOSIS — F32A Depression, unspecified: Secondary | ICD-10-CM

## 2020-04-01 DIAGNOSIS — R45851 Suicidal ideations: Secondary | ICD-10-CM

## 2020-04-01 NOTE — ED Notes (Signed)
Per Suezanne Jacquet at [employer], [employer] Lucianne Lei is on the way to pick up pt and will call [facility] ED when they arrive

## 2020-04-01 NOTE — ED Notes (Signed)
[facility] waiver signed by pt, copy given to pt, and Santiago Glad, Network engineer to scan and email to transportation services

## 2020-04-01 NOTE — ED Notes (Signed)
Called safe transport for d/c home. [facility] working on arranging a ride now

## 2020-04-01 NOTE — ED Provider Notes (Signed)
Emergency Medicine Observation Re-evaluation Note  Mario [name] is a 70 y.o. male, seen on rounds today.  Pt initially presented to the ED for complaints of Shortness of Breath Currently, the patient is resting in bed, awaiting psych placement.  Physical Exam  BP (!) 158/98   Pulse 73   Temp 97.9 F (36.6 C) (Oral)   Resp 18   Ht 6\' 2"  (1.88 m)   Wt (!) 149.7 kg   SpO2 96%   BMI 42.37 kg/m  Physical Exam General: Resting in bed, no acute distress Cardiac: Warm and well-perfused Lungs: Even and unlabored Psych: Calm and cooperative  ED Course / MDM  EKG:EKG Interpretation  Date/Time:  Monday March 30 2020 19:05:38 EST Ventricular Rate:  101 PR Interval:    QRS Duration: 110 QT Interval:  343 QTC Calculation: 445 R Axis:   6 Text Interpretation: Sinus tachycardia Confirmed by Thamas Jaegers ([id number]) on 03/30/2020 7:26:40 PM    I have reviewed the labs performed to date as well as medications administered while in observation.  Recent changes in the last 24 hours include no acute events overnight, psych recommending inpatient.  Plan  Current plan is for inpatient placement per psychiatry. Patient is not under full IVC at this time.   Lucrezia Starch, MD 04/01/20 [phone]

## 2020-04-01 NOTE — Progress Notes (Signed)
TOC CM/CSW reached out to [facility] [phone].  [facility] stated if pt wanted to come there he would have to drop his VA benefits and accept their insurance.  Pt is unwilling to do that, therefore CSW attempted to contact [facility]/[name] [phone].  CSW left HIPPA compliant message with my contact information.    CSW will continue to follow for dc needs.  Mario [name], MSW, LCSW-A Pronouns:  She, Her, [place] ED Transitions of CareClinical Social Worker [name] [phone]

## 2020-04-01 NOTE — BH Assessment (Signed)
[facility] Assessment Progress Note  Per Shuvon Rankin, NP, this voluntary pt does not require psychiatric hospitalization at this time.  Pt is psychiatrically cleared.  Shuvon reports that pt would benefit from therapy and if available, enhanced services through the [facility] where pt currently receives psychiatry.  She reports that these services would need to be virtual, and that pt's in-home IT is somewhat limited at this time.  At 11:03 this Probation officer called the [facility] and spoke to [place].  She reports that all of these resources are available through them.  She adds that they will reach out to pt either later this week or early next week to address all of these needs.  This has been included in pt's discharge instructions.  EDP Richard Roslynn Amble and pt's nurse, Vicente Males, have been notified.  Jalene Mullet, [place] Triage Specialist [phone]

## 2020-04-01 NOTE — Discharge Instructions (Signed)
For your behavioral health needs, you are advised to continue treatment through the [facility].  In addition to psychiatry they offer counseling and a variety of other behavioral health services, both in-person and virtually.  They can also assist you with necessary equipment to facilitate virtual services.  The staff at [facility] Emergency Department has already reached out to the [facility], and they have indicated that they will be contacting you later this week or early next week to provide you with the enhanced services that you need:       [facility]      [address] [place], [place] [address]      [phone]

## 2020-04-01 NOTE — ED Notes (Signed)
Called transportation services again. Per transport service, 2 ubers were called and did not successfully pick up pt. This RN was never notified that the [facility] was here either time they supposedly came. Ben at transportation services stated "I will call [employer] and try to get the safe transport van to pick him up". Pt made aware of the wait and is sitting back in bed for comfort

## 2020-04-01 NOTE — Consult Note (Signed)
Telepsych Consultation   Reason for Consult:  Suicidal ideation Referring Physician:  Luna Fuse, MD Location of Patient: [facility] ED Location of Provider: Other: [facility]  Patient Identification: Mario [name] MRN:  [id number] Principal Diagnosis: <principal problem not specified> Diagnosis:  Active Problems:   * No active hospital problems. *   Total Time spent with patient: 30 minutes  Subjective:   Mario [name] is a 70 y.o. male patient with a history of chronic pain, COPD, and depression initially presented to the ED with complaints of shortness of breath and later complaints of suicidal ideation related to his medical ailments.  Patient reporting suicidal ideation started 4 days ago related to COPD getting worse in his back treatments not working for the pain.  HPI:  Mario [name], 70 y.o., male patient seen via tele health by this provider, consulted with Dr. Dwyane Dee; and chart reviewed on 04/01/20.  On evaluation Mario [name] reports that he is feeling better today.  Reports he has been able to sleep on and off but pain makes it difficult to sleep.  Patient states that he is not having any suicidal thoughts.  States when he first came into the ED he was frustrated "I am not having any suicidal thoughts anymore just getting away from the situation, away from the house, and seeing something different is better than just sitting in the house."  Patient reports that he no longer has a driver's license so he is unable to get away from the home.  Patient states he lives in an apartment where he and another male rent a room.  States that he and his roommate get along with each other.  Patient reports no close family living nearby his sister lives in [place].  Patient also reports that he has outpatient psychiatric services through the [facility] for medication management and is done virtually related to [employer] transportation not running any more related to Covid and transportation will not start back up  until mask are no longer needed.  Patient reports that he has had therapy in the past and it did work and that he is also interested in restarting.  Patient also reports through the [facility] he has seen Mario [name] for hypnosis that he feels worked with his pain. During evaluation Mario [name] is sitting on side of bed in hospital gown in no acute distress.  He is alert, oriented x 4, calm and cooperative.  His mood is euthymic with congruent affect.  He does not appear to be responding to internal/external stimuli or delusional thoughts.  Patient denies suicidal/self-harm/homicidal ideation, psychosis, and paranoia.  Patient answered question appropriately.  Discussed setting up therapy sessions that could be done virtually.  Patient in agreement.  Will speak with behavioral health coordinator to arrange services.  Past Psychiatric History: Depression, anxiety  Risk to Self:  No Risk to Others:  No Prior Inpatient Therapy:  Yes Prior Outpatient Therapy:  Yes  Past Medical History:  Past Medical History:  Diagnosis Date  . Anxiety   . Bronchitis   . COPD (chronic obstructive pulmonary disease) ([place])   . Depression   . Hypertension   . Mental disorder   . MI (myocardial infarction) ([place])    ????  . OSA (obstructive sleep apnea)   . Suicide attempt ([place])   . Tension pneumothorax 06/27/2016  . Uveitis     Past Surgical History:  Procedure Laterality Date  . CHEST TUBE INSERTION Left 06/27/2016  . cryptorchidism    .  SKIN CANCER EXCISION     Family History:  Family History  Problem Relation Age of Onset  . Dementia Father    Family Psychiatric  History: Denies Social History:  Social History   Substance and Sexual Activity  Alcohol Use No  . Alcohol/week: 0.0 standard drinks   Comment: denies use of any drugs or alcohol     Social History   Substance and Sexual Activity  Drug Use No    Social History   Socioeconomic History  . Marital status: Single     Spouse name: Not on file  . Number of children: Not on file  . Years of education: Not on file  . Highest education level: Not on file  Occupational History  . Occupation: retired  Tobacco Use  . Smoking status: Former Smoker    Packs/day: 1.00    Years: 35.00    Pack years: 35.00    Types: Cigarettes    Quit date: 05/2016    Years since quitting: 3.8  . Smokeless tobacco: Never Used  Vaping Use  . Vaping Use: Never used  Substance and Sexual Activity  . Alcohol use: No    Alcohol/week: 0.0 standard drinks    Comment: denies use of any drugs or alcohol  . Drug use: No  . Sexual activity: Not on file  Other Topics Concern  . Not on file  Social History Narrative  . Not on file   Social Determinants of Health   Financial Resource Strain: Not on file  Food Insecurity: Not on file  Transportation Needs: Not on file  Physical Activity: Not on file  Stress: Not on file  Social Connections: Not on file   Additional Social History:    Allergies:   Allergies  Allergen Reactions  . Demerol [Meperidine] Nausea And Vomiting and Other (See Comments)    Made the patient "violently sick"  . Zocor [Simvastatin] Nausea And Vomiting and Other (See Comments)    Made him very jittery, also  . Beet [Beta Vulgaris] Nausea And Vomiting  . Liver Nausea And Vomiting    Labs:  Results for orders placed or performed during the hospital encounter of 03/30/20 (from the past 48 hour(s))  CBC     Status: Abnormal   Collection Time: 03/30/20  8:34 PM  Result Value Ref Range   WBC 13.2 (H) 4.0 - 10.5 K/uL   RBC 4.63 4.22 - 5.81 MIL/uL   Hemoglobin 15.2 13.0 - 17.0 g/dL   HCT 46.5 39.0 - 52.0 %   MCV 100.4 (H) 80.0 - 100.0 fL   MCH 32.8 26.0 - 34.0 pg   MCHC 32.7 30.0 - 36.0 g/dL   RDW 13.3 11.5 - 15.5 %   Platelets 302 150 - 400 K/uL   nRBC 0.0 0.0 - 0.2 %    Comment: Performed at [facility], [place] [address] [place], [place] [address]  Troponin I (High  Sensitivity)     Status: None   Collection Time: 03/30/20  8:34 PM  Result Value Ref Range   Troponin I (High Sensitivity) 5 <18 ng/L    Comment: (NOTE) Elevated high sensitivity troponin I (hsTnI) values and significant  changes across serial measurements may suggest ACS but many other  chronic and acute conditions are known to elevate hsTnI results.  Refer to the "Links" section for chest pain algorithms and additional  guidance. Performed at [facility], [place] [address], [place], [place] [address]   Brain natriuretic  peptide     Status: None   Collection Time: 03/30/20  8:34 PM  Result Value Ref Range   B Natriuretic Peptide 57.3 0.0 - 100.0 pg/mL    Comment: Performed at [facility], [place] [address], [place], [place] [address]  Comprehensive metabolic panel     Status: Abnormal   Collection Time: 03/30/20  8:34 PM  Result Value Ref Range   Sodium 138 135 - 145 mmol/L   Potassium 4.3 3.5 - 5.1 mmol/L   Chloride 103 98 - 111 mmol/L   CO2 21 (L) 22 - 32 mmol/L   Glucose, Bld 116 (H) 70 - 99 mg/dL    Comment: Glucose reference range applies only to samples taken after fasting for at least 8 hours.   BUN 19 8 - 23 mg/dL   Creatinine, Ser 1.08 0.61 - 1.24 mg/dL   Calcium 9.2 8.9 - 10.3 mg/dL   Total Protein 6.7 6.5 - 8.1 g/dL   Albumin 4.1 3.5 - 5.0 g/dL   AST 25 15 - 41 U/L   ALT 29 0 - 44 U/L   Alkaline Phosphatase 51 38 - 126 U/L   Total Bilirubin 0.3 0.3 - 1.2 mg/dL   GFR, Estimated >60 >60 mL/min    Comment: (NOTE) Calculated using the CKD-EPI Creatinine Equation (2021)    Anion gap 14 5 - 15    Comment: Performed at [facility], [place] [address] [place], [place] [address]  SARS Coronavirus 2 by RT PCR (hospital order, performed in [facility] hospital lab) Nasopharyngeal Nasopharyngeal Swab     Status: None   Collection Time: 03/30/20  8:35 PM   Specimen: Nasopharyngeal Swab  Result Value Ref Range   SARS  Coronavirus 2 NEGATIVE NEGATIVE    Comment: (NOTE) SARS-CoV-2 target nucleic acids are NOT DETECTED.  The SARS-CoV-2 RNA is generally detectable in upper and lower respiratory specimens during the acute phase of infection. The lowest concentration of SARS-CoV-2 viral copies this assay can detect is 250 copies / mL. A negative result does not preclude SARS-CoV-2 infection and should not be used as the sole basis for treatment or other patient management decisions.  A negative result may occur with improper specimen collection / handling, submission of specimen other than nasopharyngeal swab, presence of viral mutation(s) within the areas targeted by this assay, and inadequate number of viral copies (<250 copies / mL). A negative result must be combined with clinical observations, patient history, and epidemiological information.  Fact Sheet for Patients:   StrictlyIdeas.no  Fact Sheet for Healthcare Providers: BankingDealers.co.za  This test is not yet approved or  cleared by the [place] FDA and has been authorized for detection and/or diagnosis of SARS-CoV-2 by FDA under an Emergency Use Authorization (EUA).  This EUA will remain in effect (meaning this test can be used) for the duration of the COVID-19 declaration under Section 564(b)(1) of the Act, 21 U.S.C. section 360bbb-3(b)(1), unless the authorization is terminated or revoked sooner.  Performed at [facility], [place] [address] [place], [place] [address]   Rapid urine drug screen (hospital performed)     Status: None   Collection Time: 03/30/20  9:11 PM  Result Value Ref Range   Opiates NONE DETECTED NONE DETECTED   Cocaine NONE DETECTED NONE DETECTED   Benzodiazepines NONE DETECTED NONE DETECTED   Amphetamines NONE DETECTED NONE DETECTED   Tetrahydrocannabinol NONE DETECTED NONE DETECTED   Barbiturates NONE DETECTED NONE DETECTED  Comment:  (NOTE) DRUG SCREEN FOR MEDICAL PURPOSES ONLY.  IF CONFIRMATION IS NEEDED FOR ANY PURPOSE, NOTIFY LAB WITHIN 5 DAYS.  LOWEST DETECTABLE LIMITS FOR URINE DRUG SCREEN Drug Class                     Cutoff (ng/mL) Amphetamine and metabolites    1000 Barbiturate and metabolites    200 Benzodiazepine                 664 Tricyclics and metabolites     300 Opiates and metabolites        300 Cocaine and metabolites        300 THC                            50 Performed at [facility], [place] [address] [place], [place] [address]   Troponin I (High Sensitivity)     Status: None   Collection Time: 03/30/20 10:37 PM  Result Value Ref Range   Troponin I (High Sensitivity) 5 <18 ng/L    Comment: (NOTE) Elevated high sensitivity troponin I (hsTnI) values and significant  changes across serial measurements may suggest ACS but many other  chronic and acute conditions are known to elevate hsTnI results.  Refer to the "Links" section for chest pain algorithms and additional  guidance. Performed at [facility], [place] [address] [place], [place] [address]   CBG monitoring, ED     Status: Abnormal   Collection Time: 03/31/20  7:39 AM  Result Value Ref Range   Glucose-Capillary 230 (H) 70 - 99 mg/dL    Comment: Glucose reference range applies only to samples taken after fasting for at least 8 hours.  CBG monitoring, ED     Status: Abnormal   Collection Time: 03/31/20  5:09 PM  Result Value Ref Range   Glucose-Capillary 168 (H) 70 - 99 mg/dL    Comment: Glucose reference range applies only to samples taken after fasting for at least 8 hours.    Medications:  Current Facility-Administered Medications  Medication Dose Route Frequency Provider Last Rate Last Admin  . buPROPion (WELLBUTRIN XL) 24 hr tablet 300 mg  300 mg Oral Daily Luna Fuse, MD   300 mg at 04/01/20 0945  . cyclobenzaprine (FLEXERIL) tablet 10 mg  10 mg Oral QHS Luna Fuse, MD   10 mg  at 03/31/20 2222  . diltiazem (CARDIZEM CD) 24 hr capsule 180 mg  180 mg Oral Daily Luna Fuse, MD   180 mg at 04/01/20 0944  . docusate sodium (COLACE) capsule 100 mg  100 mg Oral Daily Luna Fuse, MD   100 mg at 04/01/20 0944  . DULoxetine (CYMBALTA) DR capsule 60 mg  60 mg Oral BID WC Luna Fuse, MD   60 mg at 04/01/20 0945  . gabapentin (NEURONTIN) capsule 900 mg  900 mg Oral QHS Luna Fuse, MD   900 mg at 03/31/20 2223  . HYDROmorphone (DILAUDID) tablet 1 mg  1 mg Oral Q6H PRN Luna Fuse, MD   1 mg at 04/01/20 0949  . lurasidone (LATUDA) tablet 40 mg  40 mg Oral QPC supper Luna Fuse, MD   40 mg at 03/31/20 2224  . metFORMIN (GLUCOPHAGE) tablet 500 mg  500 mg Oral BID WC Luna Fuse, MD   500 mg at 04/01/20 0944  . mycophenolate (CELLCEPT) capsule  1,500 mg  1,500 mg Oral BID Luna Fuse, MD   1,500 mg at 04/01/20 4782  . Oxcarbazepine (TRILEPTAL) tablet 600 mg  600 mg Oral TID Luna Fuse, MD   600 mg at 04/01/20 9562   Current Outpatient Medications  Medication Sig Dispense Refill  . acetaminophen (TYLENOL) 500 MG tablet Take 1,000 mg by mouth every 6 (six) hours as needed for moderate pain.    [name] b complex vitamins capsule Take 1 capsule by mouth daily.    [name] buPROPion (WELLBUTRIN XL) 300 MG 24 hr tablet Take 300 mg by mouth daily.     . busPIRone (BUSPAR) 15 MG tablet Take 15 mg by mouth daily.     . calcium-vitamin D (OSCAL WITH D) 500-200 MG-UNIT tablet Take 1 tablet by mouth 2 (two) times daily with a meal.    . Cholecalciferol (VITAMIN D-3) 25 MCG (1000 UT) CAPS Take 1,000 Units by mouth daily.    . cyclobenzaprine (FLEXERIL) 10 MG tablet Take 10 mg by mouth at bedtime.    [name] diltiazem (CARDIZEM CD) 180 MG 24 hr capsule Take 180 mg by mouth daily.    [name] docusate calcium (SURFAK) 240 MG capsule Take 240 mg by mouth daily.    . DULoxetine (CYMBALTA) 60 MG capsule Take 1 capsule (60 mg total) by mouth 2 (two) times daily. (Patient taking differently: Take  60 mg by mouth 2 (two) times daily with a meal.) 60 capsule 0  . etanercept (ENBREL) 50 MG/ML injection Inject 50 mg into the skin every Wednesday.    . fluticasone (FLONASE) 50 MCG/ACT nasal spray Place 2 sprays into both nostrils daily.     . folic acid (FOLVITE) 1 MG tablet Take 1 mg by mouth daily.    [name] gabapentin (NEURONTIN) 300 MG capsule Take 2 capsules (600 mg total) by mouth 2 (two) times daily. (Patient taking differently: Take 900 mg by mouth at bedtime.) 60 capsule 1  . HYDROmorphone (DILAUDID) 2 MG tablet Take 0.5 tablets (1 mg total) by mouth every 6 (six) hours as needed for severe pain. 13 tablet 0  . lisinopril (ZESTRIL) 20 MG tablet Take 20 mg by mouth daily.    [name] lurasidone (LATUDA) 40 MG TABS tablet Take 40 mg by mouth daily after supper.    . Melatonin 3 MG TABS Take 6 mg by mouth at bedtime.    . metFORMIN (GLUCOPHAGE) 500 MG tablet Take 500 mg by mouth 2 (two) times daily with a meal.    . methotrexate (RHEUMATREX) 2.5 MG tablet Take 6 tablets (15 mg total) by mouth every Saturday. Hold until seen by rheumatology  Caution:Chemotherapy. Protect from light. (Patient taking differently: Take 15 mg by mouth every Saturday.) 4 tablet 0  . Multiple Vitamin (MULTIVITAMIN WITH MINERALS) TABS tablet Take 1 tablet by mouth daily.    . mycophenolate (CELLCEPT) 250 MG capsule Take 1,500 mg by mouth 2 (two) times daily.    . nitroGLYCERIN (NITROSTAT) 0.4 MG SL tablet Place 1 tablet (0.4 mg total) under the tongue every 5 (five) minutes as needed for chest pain. 12 tablet 12  . OVER THE COUNTER MEDICATION Place 1 drop into both ears daily. Miracell ProEar for irritated itchy ears    . OVER THE COUNTER MEDICATION Take 2 tablets by mouth daily. Pure Zzz    . Oxcarbazepine (TRILEPTAL) 300 MG tablet Take 600 mg by mouth 3 (three) times daily.    . polyethylene glycol (MIRALAX / GLYCOLAX) 17 g  packet Take 17 g by mouth daily.    [name] Propylene Glycol (SYSTANE BALANCE) 0.6 % SOLN Place 1 drop into  both eyes in the morning, at noon, and at bedtime.     . sodium chloride (OCEAN) 0.65 % SOLN nasal spray Place 2 sprays into both nostrils 4 (four) times daily as needed for congestion.    . tamsulosin (FLOMAX) 0.4 MG CAPS capsule Take 0.4 mg by mouth daily.    . Tiotropium Bromide-Olodaterol (STIOLTO RESPIMAT) 2.5-2.5 MCG/ACT AERS Inhale 2 puffs into the lungs daily.    . traMADol (ULTRAM) 50 MG tablet Take 100 mg by mouth at bedtime.    . traZODone (DESYREL) 50 MG tablet Take 75 mg by mouth at bedtime.    [name] VITAMIN A PO Take 1 tablet by mouth daily.    . vitamin C (ASCORBIC ACID) 500 MG tablet Take 500 mg by mouth 2 (two) times daily.     . predniSONE (DELTASONE) 20 MG tablet Take 2 tablets (40 mg total) by mouth daily with breakfast. For the next four days (Patient not taking: No sig reported) 8 tablet 0  . PRESCRIPTION MEDICATION Inhale into the lungs See admin instructions. CPAP- At bedtime and during during any naps      Musculoskeletal: Strength & Muscle Tone: within normal limits Gait & Station: normal Patient leans: N/A  Psychiatric Specialty Exam: Physical Exam  Review of Systems  Blood pressure 131/75, pulse 87, temperature 97.9 F (36.6 C), temperature source Oral, resp. rate 16, height 6\' 2"  (1.88 m), weight (!) 149.7 kg, SpO2 97 %.Body mass index is 42.37 kg/m.  General Appearance: Casual in hospital gown  Eye Contact:  Good  Speech:  Clear and Coherent and Normal Rate  Volume:  Normal  Mood:  Euthymic  Affect:  Congruent  Thought Process:  Coherent, Goal Directed and Descriptions of Associations: Intact  Orientation:  Full (Time, Place, and Person)  Thought Content:  WDL  Suicidal Thoughts:  No  Homicidal Thoughts:  No  Memory:  Immediate;   Good Recent;   Good  Judgement:  Intact  Insight:  Present  Psychomotor Activity:  Normal  Concentration:  Concentration: Good and Attention Span: Good  Recall:  Good  Fund of Knowledge:  Good  Language:  Good  Akathisia:   No  Handed:  Right  AIMS (if indicated):     Assets:  Communication Skills Desire for Improvement Housing  ADL's:  Intact  Cognition:  WNL  Sleep:      Patient with a history of anxiety, depression and chronic suicidal ideations related to physical ailments.  Patient's closest relative his sister lives out of state in [place]; but patient living with a roommate whom he states he gets along with but no local family support.  Patient is a veteran with chronic pain and isolation at home related to no transportation and only receiving medication management virtually through the [place].  Patient does not feel he needs psychiatric admission but would benefit from therapy sessions.  May be a possibility for patient to receive intensive outpatient or a type of intensive in-home services.  Will consult with behavioral health coordinator to assist patient in arranging services Also spoke to social worker to see if patient was eligible for day program since part of his depression Stim from not being able to get out and around anymore because he is unable to drive.    Treatment Plan Summary: Plan Psychiatrically clear.  Behavioral health coordinator to assist with  arranging outpatient psychiatric services for therapy virtually for patient.  Disposition: Psychiatrically clear No evidence of imminent risk to self or others at present.   Patient does not meet criteria for psychiatric inpatient admission. Supportive therapy provided about ongoing stressors. Refer to IOP. Discussed crisis plan, support from social network, calling 911, coming to the Emergency Department, and calling Suicide Hotline.  This service was provided via telemedicine using a 2-way, interactive audio and video technology.  Names of all persons participating in this telemedicine service and their role in this encounter. Name: Earleen Newport Role: NP  Name: Dr. Hampton Abbot Role: Psychiatrist  Name: Mario [name] Role: Patient   Name: Dr. Roslynn Amble Role: [name] EDP informed via secure message: Patient seen and psychiatrically cleared.  [place] coordinator Gershon Mussel) assisting patient with getting set up with therapy and Social work consult ordered to assist patient with resources for day program     Mario Rivenbark, NP 04/01/2020 10:08 AM

## 2020-04-03 ENCOUNTER — Encounter (HOSPITAL_COMMUNITY): Payer: Self-pay | Admitting: Emergency Medicine

## 2020-04-03 ENCOUNTER — Other Ambulatory Visit: Payer: Self-pay

## 2020-04-03 ENCOUNTER — Emergency Department (HOSPITAL_COMMUNITY): Payer: No Typology Code available for payment source

## 2020-04-03 ENCOUNTER — Inpatient Hospital Stay (HOSPITAL_COMMUNITY)
Admission: EM | Admit: 2020-04-03 | Discharge: 2020-04-07 | DRG: 193 | Disposition: A | Discharge status: Home / Self Care | Payer: No Typology Code available for payment source | Attending: Internal Medicine | Admitting: Internal Medicine

## 2020-04-03 DIAGNOSIS — I252 Old myocardial infarction: Secondary | ICD-10-CM

## 2020-04-03 DIAGNOSIS — Z6841 Body Mass Index (BMI) 40.0 and over, adult: Secondary | ICD-10-CM

## 2020-04-03 DIAGNOSIS — J9621 Acute and chronic respiratory failure with hypoxia: Secondary | ICD-10-CM | POA: Diagnosis present

## 2020-04-03 DIAGNOSIS — Z20822 Contact with and (suspected) exposure to covid-19: Secondary | ICD-10-CM | POA: Diagnosis present

## 2020-04-03 DIAGNOSIS — I10 Essential (primary) hypertension: Secondary | ICD-10-CM | POA: Diagnosis present

## 2020-04-03 DIAGNOSIS — Z7984 Long term (current) use of oral hypoglycemic drugs: Secondary | ICD-10-CM

## 2020-04-03 DIAGNOSIS — E1159 Type 2 diabetes mellitus with other circulatory complications: Secondary | ICD-10-CM | POA: Diagnosis present

## 2020-04-03 DIAGNOSIS — G4733 Obstructive sleep apnea (adult) (pediatric): Secondary | ICD-10-CM | POA: Diagnosis present

## 2020-04-03 DIAGNOSIS — F32A Depression, unspecified: Secondary | ICD-10-CM | POA: Diagnosis present

## 2020-04-03 DIAGNOSIS — Z79899 Other long term (current) drug therapy: Secondary | ICD-10-CM

## 2020-04-03 DIAGNOSIS — E119 Type 2 diabetes mellitus without complications: Secondary | ICD-10-CM

## 2020-04-03 DIAGNOSIS — K5903 Drug induced constipation: Secondary | ICD-10-CM | POA: Diagnosis present

## 2020-04-03 DIAGNOSIS — G894 Chronic pain syndrome: Secondary | ICD-10-CM | POA: Diagnosis present

## 2020-04-03 DIAGNOSIS — Z1589 Genetic susceptibility to other disease: Secondary | ICD-10-CM

## 2020-04-03 DIAGNOSIS — Z9114 Patient's other noncompliance with medication regimen: Secondary | ICD-10-CM

## 2020-04-03 DIAGNOSIS — Z23 Encounter for immunization: Secondary | ICD-10-CM

## 2020-04-03 DIAGNOSIS — J189 Pneumonia, unspecified organism: Principal | ICD-10-CM | POA: Diagnosis present

## 2020-04-03 DIAGNOSIS — I776 Arteritis, unspecified: Secondary | ICD-10-CM | POA: Diagnosis present

## 2020-04-03 DIAGNOSIS — J440 Chronic obstructive pulmonary disease with acute lower respiratory infection: Secondary | ICD-10-CM | POA: Diagnosis present

## 2020-04-03 DIAGNOSIS — T40605A Adverse effect of unspecified narcotics, initial encounter: Secondary | ICD-10-CM | POA: Diagnosis present

## 2020-04-03 DIAGNOSIS — J449 Chronic obstructive pulmonary disease, unspecified: Secondary | ICD-10-CM | POA: Diagnosis present

## 2020-04-03 DIAGNOSIS — Z87891 Personal history of nicotine dependence: Secondary | ICD-10-CM

## 2020-04-03 DIAGNOSIS — D849 Immunodeficiency, unspecified: Secondary | ICD-10-CM | POA: Diagnosis present

## 2020-04-03 DIAGNOSIS — H209 Unspecified iridocyclitis: Secondary | ICD-10-CM | POA: Diagnosis present

## 2020-04-03 DIAGNOSIS — Z85828 Personal history of other malignant neoplasm of skin: Secondary | ICD-10-CM

## 2020-04-03 LAB — COMPREHENSIVE METABOLIC PANEL
ALT: 37 U/L (ref 0–44)
AST: 28 U/L (ref 15–41)
Albumin: 4.5 g/dL (ref 3.5–5.0)
Alkaline Phosphatase: 56 U/L (ref 38–126)
Anion gap: 15 (ref 5–15)
BUN: 18 mg/dL (ref 8–23)
CO2: 24 mmol/L (ref 22–32)
Calcium: 9.6 mg/dL (ref 8.9–10.3)
Chloride: 100 mmol/L (ref 98–111)
Creatinine, Ser: 1.22 mg/dL (ref 0.61–1.24)
GFR, Estimated: >60 mL/min (ref >60)
Glucose, Bld: 98 mg/dL (ref 70–99)
Potassium: 4.5 mmol/L (ref 3.5–5.1)
Sodium: 139 mmol/L (ref 135–145)
Total Bilirubin: 0.6 mg/dL (ref 0.3–1.2)
Total Protein: 7.6 g/dL (ref 6.5–8.1)

## 2020-04-03 LAB — URINALYSIS, ROUTINE W REFLEX MICROSCOPIC
Bilirubin Urine: NEGATIVE
Glucose, UA: NEGATIVE mg/dL
Hgb urine dipstick: NEGATIVE
Ketones, ur: 5 mg/dL — AB
Leukocytes,Ua: NEGATIVE
Nitrite: NEGATIVE
Protein, ur: NEGATIVE mg/dL
Specific Gravity, Urine: 1.029 (ref 1.005–1.030)
pH: 6 (ref 5.0–8.0)

## 2020-04-03 LAB — CBC WITH DIFFERENTIAL/PLATELET
Abs Immature Granulocytes: 0.09 10*3/uL — ABNORMAL HIGH (ref 0.00–0.07)
Basophils Absolute: 0.1 10*3/uL (ref 0.0–0.1)
Basophils Relative: 1 %
Eosinophils Absolute: 0.2 10*3/uL (ref 0.0–0.5)
Eosinophils Relative: 1 %
HCT: 50.1 % (ref 39.0–52.0)
Hemoglobin: 16.9 g/dL (ref 13.0–17.0)
Immature Granulocytes: 1 %
Lymphocytes Relative: 26 %
Lymphs Abs: 3.9 10*3/uL (ref 0.7–4.0)
MCH: 33.1 pg (ref 26.0–34.0)
MCHC: 33.7 g/dL (ref 30.0–36.0)
MCV: 98 fL (ref 80.0–100.0)
Monocytes Absolute: 1.4 10*3/uL — ABNORMAL HIGH (ref 0.1–1.0)
Monocytes Relative: 9 %
Neutro Abs: 9.6 10*3/uL — ABNORMAL HIGH (ref 1.7–7.7)
Neutrophils Relative %: 62 %
Platelets: 342 10*3/uL (ref 150–400)
RBC: 5.11 MIL/uL (ref 4.22–5.81)
RDW: 13.3 % (ref 11.5–15.5)
WBC: 15.2 10*3/uL — ABNORMAL HIGH (ref 4.0–10.5)
nRBC: 0 % (ref 0.0–0.2)

## 2020-04-03 LAB — SARS CORONAVIRUS 2 BY RT PCR (HOSPITAL ORDER, PERFORMED IN ~~LOC~~ HOSPITAL LAB): SARS Coronavirus 2: NEGATIVE

## 2020-04-03 MED ORDER — INFLUENZA VAC A&B SA ADJ QUAD 0.5 ML IM PRSY
0.5000 mL | PREFILLED_SYRINGE | INTRAMUSCULAR | Status: AC
  Administered 2020-04-04: 0.5 mL via INTRAMUSCULAR
  Filled 2020-04-03: qty 0.5

## 2020-04-03 MED ORDER — IOHEXOL 350 MG/ML SOLN
100.0000 mL | Freq: Once | INTRAVENOUS | Status: AC | PRN
  Administered 2020-04-03: 100 mL via INTRAVENOUS

## 2020-04-03 MED ORDER — SODIUM CHLORIDE 0.9 % IV BOLUS
500.0000 mL | Freq: Once | INTRAVENOUS | Status: AC
  Administered 2020-04-03: 500 mL via INTRAVENOUS

## 2020-04-03 MED ORDER — AZITHROMYCIN 250 MG PO TABS
500.0000 mg | ORAL_TABLET | Freq: Once | ORAL | Status: AC
  Administered 2020-04-03: 500 mg via ORAL
  Filled 2020-04-03: qty 2

## 2020-04-03 MED ORDER — ALBUTEROL SULFATE HFA 108 (90 BASE) MCG/ACT IN AERS
2.0000 | INHALATION_SPRAY | Freq: Once | RESPIRATORY_TRACT | Status: AC
  Administered 2020-04-03: 2 via RESPIRATORY_TRACT
  Filled 2020-04-03: qty 6.7

## 2020-04-03 MED ORDER — ONDANSETRON HCL 4 MG/2ML IJ SOLN
4.0000 mg | Freq: Once | INTRAMUSCULAR | Status: AC
  Administered 2020-04-03: 4 mg via INTRAVENOUS
  Filled 2020-04-03: qty 2

## 2020-04-03 MED ORDER — SODIUM CHLORIDE 0.9 % IV SOLN
1.0000 g | Freq: Once | INTRAVENOUS | Status: AC
  Administered 2020-04-03: 1 g via INTRAVENOUS
  Filled 2020-04-03: qty 10

## 2020-04-03 NOTE — ED Provider Notes (Signed)
[facility] DEPT Provider Note   CSN: [id number] Arrival date & time: 04/03/20  1650     History Chief Complaint  Patient presents with  . Constipation    Mario [name] is a 70 y.o. male.  The history is provided by the patient and medical records.  Constipation  Mario [name] is a 70 y.o. male who presents to the Emergency Department complaining of constipation. He reports history of chronic constipation but symptoms worsened over a week ago when he was started on pain medication. He reports lower abdominal discomfort and is only able to have a few pellets of stool out today. Last BM was greater than one week ago. He denies any fevers, nausea, vomiting. He has a history of vasculitis, COPD. He is on methotrexate, Enbrel and CellCept for vasculitis.    Past Medical History:  Diagnosis Date  . Anxiety   . Bronchitis   . COPD (chronic obstructive pulmonary disease) ([place])   . Depression   . Hypertension   . Mental disorder   . MI (myocardial infarction) ([place])    ????  . OSA (obstructive sleep apnea)   . Suicide attempt ([place])   . Tension pneumothorax 06/27/2016  . Uveitis     Patient Active Problem List   Diagnosis Date Noted  . CAP (community acquired pneumonia) 04/03/2020  . Acute exacerbation of chronic obstructive pulmonary disease (COPD) ([place]) 01/05/2020  . Vertigo 12/26/2019  . 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 Stage 3a chronic kidney disease ([place]) 12/26/2019  . Abnormal CT of the chest 12/26/2019  . COPD (chronic obstructive pulmonary disease) ([place])   . AKI (acute kidney injury) ([place])   . Hyperglycemia 06/23/2019  . OSA (obstructive sleep apnea) 06/23/2019  . Suicidal ideation 06/23/2019  . Major depressive disorder, recurrent episode ([place]) 10/12/2018  . 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 Cellulitis of both feet 07/16/2017  . DM2 (diabetes mellitus, type 2) ([place]) 07/16/2017  . HLA  B27 (HLA B27 positive) 07/16/2017  . Acute chest pain   . Hyperkalemia   . Spontaneous pneumothorax 06/27/2016  . Hypoxia   . Pneumothorax on left   . Dyspnea 05/23/2014  . COPD exacerbation ([place]) 05/23/2014  . Malingering 01/21/2013  . Depression 01/11/2013  . Tobacco abuse 01/11/2013  . Obesity, unspecified 01/11/2013  . Chest pain 01/10/2013  . HTN (hypertension) 01/10/2013    Past Surgical History:  Procedure Laterality Date  . CHEST TUBE INSERTION Left 06/27/2016  . cryptorchidism    . SKIN CANCER EXCISION         Family History  Problem Relation Age of Onset  . Dementia Father     Social History   Tobacco Use  . Smoking status: Former Smoker    Packs/day: 1.00    Years: 35.00    Pack years: 35.00    Types: Cigarettes    Quit date: 05/2016    Years since quitting: 3.8  . Smokeless tobacco: Never Used  Vaping Use  . Vaping Use: Never used  Substance Use Topics  . Alcohol use: No    Alcohol/week: 0.0 standard drinks    Comment: denies use of any drugs or alcohol  . Drug use: No    Home Medications Prior to Admission medications   Medication Sig Start Date End Date Taking? Authorizing Provider  acetaminophen (TYLENOL) 500 MG tablet Take 1,000 mg by mouth every 6 (six) hours as needed for moderate pain.   Yes [provider]  b complex vitamins capsule Take 1 capsule by mouth daily.   Yes [provider]  busPIRone (BUSPAR) 15 MG tablet Take 15 mg by mouth daily.    Yes [provider]  calcium-vitamin D (OSCAL WITH D) 500-200 MG-UNIT tablet Take 1 tablet by mouth 2 (two) times daily with a meal.   Yes [provider]  Cholecalciferol (VITAMIN D-3) 25 MCG (1000 UT) CAPS Take 1,000 Units by mouth daily.   Yes [provider]  cyclobenzaprine (FLEXERIL) 10 MG tablet Take 10 mg by mouth at bedtime.   Yes [provider]  diltiazem (CARDIZEM CD) 180 MG 24 hr capsule Take 180 mg by mouth daily.   Yes  [provider]  docusate calcium (SURFAK) 240 MG capsule Take 240 mg by mouth daily.   Yes [provider]  DULoxetine (CYMBALTA) 60 MG capsule Take 1 capsule (60 mg total) by mouth 2 (two) times daily. Patient taking differently: Take 60 mg by mouth 2 (two) times daily with a meal. 07/21/17  Yes Lavina Hamman, MD  etanercept (ENBREL) 50 MG/ML injection Inject 50 mg into the skin every Wednesday.   Yes [provider]  fluticasone (FLONASE) 50 MCG/ACT nasal spray Place 2 sprays into both nostrils daily.    Yes [provider]  folic acid (FOLVITE) 1 MG tablet Take 1 mg by mouth daily.   Yes [provider]  gabapentin (NEURONTIN) 300 MG capsule Take 2 capsules (600 mg total) by mouth 2 (two) times daily. Patient taking differently: Take 900 mg by mouth at bedtime. 07/06/16  Yes Lars Pinks M, PA-C  HYDROmorphone (DILAUDID) 2 MG tablet Take 0.5 tablets (1 mg total) by mouth every 6 (six) hours as needed for severe pain. 03/27/20  Yes Carmin Muskrat, MD  lisinopril (ZESTRIL) 20 MG tablet Take 20 mg by mouth daily.   Yes [provider]  lurasidone (LATUDA) 40 MG TABS tablet Take 40 mg by mouth daily after supper.   Yes [provider]  Melatonin 3 MG TABS Take 6 mg by mouth at bedtime.   Yes [provider]  metFORMIN (GLUCOPHAGE) 500 MG tablet Take 500 mg by mouth 2 (two) times daily with a meal.   Yes [provider]  methotrexate (RHEUMATREX) 2.5 MG tablet Take 6 tablets (15 mg total) by mouth every Saturday. Hold until seen by rheumatology  Caution:Chemotherapy. Protect from light. Patient taking differently: Take 15 mg by mouth every Saturday. 07/22/17  Yes Lavina Hamman, MD  Multiple Vitamin (MULTIVITAMIN WITH MINERALS) TABS tablet Take 1 tablet by mouth daily.   Yes [provider]  mycophenolate (CELLCEPT) 250 MG capsule Take 1,500 mg by mouth 2 (two) times daily.   Yes [provider]   OVER THE COUNTER MEDICATION Place 1 drop into both ears daily. Miracell ProEar for irritated itchy ears   Yes [provider]  OVER THE COUNTER MEDICATION Take 2 tablets by mouth daily. Pure Zzz   Yes [provider]  Oxcarbazepine (TRILEPTAL) 300 MG tablet Take 600 mg by mouth 3 (three) times daily.   Yes [provider]  polyethylene glycol (MIRALAX / GLYCOLAX) 17 g packet Take 17 g by mouth daily.   Yes [provider]  PRESCRIPTION MEDICATION Inhale into the lungs See admin instructions. CPAP- At bedtime and during during any naps   Yes [provider]  Propylene Glycol (SYSTANE BALANCE) 0.6 % SOLN Place 1 drop into both eyes in  the morning, at noon, and at bedtime.    Yes [provider]  tamsulosin (FLOMAX) 0.4 MG CAPS capsule Take 0.4 mg by mouth daily.   Yes [provider]  Tiotropium Bromide-Olodaterol (STIOLTO RESPIMAT) 2.5-2.5 MCG/ACT AERS Inhale 2 puffs into the lungs daily.   Yes [provider]  traMADol (ULTRAM) 50 MG tablet Take 100 mg by mouth at bedtime.   Yes [provider]  traZODone (DESYREL) 50 MG tablet Take 75 mg by mouth at bedtime.   Yes [provider]  VITAMIN A PO Take 1 tablet by mouth daily.   Yes [provider]  vitamin C (ASCORBIC ACID) 500 MG tablet Take 500 mg by mouth 2 (two) times daily.    Yes [provider]  nitroGLYCERIN (NITROSTAT) 0.4 MG SL tablet Place 1 tablet (0.4 mg total) under the tongue every 5 (five) minutes as needed for chest pain. 07/03/19   Danford, Suann Larry, MD  sodium chloride (OCEAN) 0.65 % SOLN nasal spray Place 2 sprays into both nostrils 4 (four) times daily as needed for congestion.    [provider]    Allergies    Demerol [meperidine], Zocor [simvastatin], Beet [beta vulgaris], and Liver  Review of Systems   Review of Systems  Gastrointestinal: Positive for constipation.  All other systems reviewed and are  negative.   Physical Exam Updated Vital Signs BP 136/70   Pulse 92   Temp 98.5 F (36.9 C) (Oral)   Resp 15   SpO2 92%   Physical Exam Vitals and nursing note reviewed.  Constitutional:      Appearance: He is well-developed and well-nourished.  HENT:     Head: Normocephalic and atraumatic.  Cardiovascular:     Rate and Rhythm: Regular rhythm. Tachycardia present.     Heart sounds: No murmur heard.   Pulmonary:     Effort: Pulmonary effort is normal. No respiratory distress.     Comments: Decreased air movement bilaterally Abdominal:     Palpations: Abdomen is soft.     Tenderness: There is no abdominal tenderness. There is no guarding or rebound.  Musculoskeletal:        General: No tenderness or edema.  Skin:    General: Skin is warm and dry.  Neurological:     Mental Status: He is alert and oriented to person, place, and time.  Psychiatric:        Mood and Affect: Mood and affect normal.        Behavior: Behavior normal.     ED Results / Procedures / Treatments   Labs (all labs ordered are listed, but only abnormal results are displayed) Labs Reviewed  CBC WITH DIFFERENTIAL/PLATELET - Abnormal; Notable for the following components:      Result Value   WBC 15.2 (*)    Neutro Abs 9.6 (*)    Monocytes Absolute 1.4 (*)    Abs Immature Granulocytes 0.09 (*)    All other components within normal limits  URINALYSIS, ROUTINE W REFLEX MICROSCOPIC - Abnormal; Notable for the following components:   Color, Urine AMBER (*)    Ketones, ur 5 (*)    All other components within normal limits  SARS CORONAVIRUS 2 BY RT PCR [facility] ORDER, [place] [facility])  COMPREHENSIVE METABOLIC PANEL    EKG EKG Interpretation  Date/Time:  Friday April 03 2020 17:52:08 EST Ventricular Rate:  102 PR Interval:    QRS Duration: 106 QT Interval:  334 QTC Calculation:  435 R Axis:   -44 Text Interpretation: Sinus tachycardia Left axis deviation Confirmed by  Quintella Reichert [phone]) on 04/03/2020 5:55:12 PM   Radiology CT Angio Chest PE W/Cm &/Or Wo Cm  Result Date: 04/03/2020 CLINICAL DATA:  70 year old male with concern for pulmonary embolism. EXAM: CT ANGIOGRAPHY CHEST CT ABDOMEN AND PELVIS WITH CONTRAST TECHNIQUE: Multidetector CT imaging of the chest was performed using the standard protocol during bolus administration of intravenous contrast. Multiplanar CT image reconstructions and MIPs were obtained to evaluate the vascular anatomy. Multidetector CT imaging of the abdomen and pelvis was performed using the standard protocol during bolus administration of intravenous contrast. CONTRAST:  113m OMNIPAQUE IOHEXOL 350 MG/ML SOLN COMPARISON:  Chest CT dated 12/26/2019 and radiograph dated 04/03/2020. FINDINGS: CTA CHEST FINDINGS Cardiovascular: There is no cardiomegaly or pericardial effusion. There is coronary vascular calcification. Moderate atherosclerotic calcification of the thoracic aorta. No aneurysmal dilatation. Evaluation of the pulmonary arteries is limited due to suboptimal opacification and timing of the contrast. No definite pulmonary artery embolus identified. Mediastinum/Nodes: No hilar or mediastinal adenopathy. The esophagus and the thyroid gland are grossly unremarkable. No mediastinal fluid collection. Lungs/Pleura: Shallow inspiration. There is severe emphysematous changes of the lungs with bilateral lower lobe subpleural blebs. There is a 3.2 x 2.6 cm focus of ground-glass opacity at the left lung base which may represent scarring, atelectasis, or infiltrate. This is a centrally similar to prior CT. Continued follow-up recommended. No new consolidation. Small left pleural effusion. No pneumothorax. The central airways are patent. Musculoskeletal: Osteopenia with degenerative changes of the spine. No acute osseous pathology. Review of the MIP images confirms the above findings. CT ABDOMEN and PELVIS FINDINGS No intra-abdominal free air or free  fluid. Hepatobiliary: There is fatty infiltration of the liver. No intrahepatic biliary dilatation. There is a 2.5 cm cyst in the right lobe of the liver. The gallbladder is unremarkable. Pancreas: Unremarkable. No pancreatic ductal dilatation or surrounding inflammatory changes. Spleen: Normal in size without focal abnormality. Adrenals/Urinary Tract: The adrenal glands unremarkable. There is no hydronephrosis on either side. There is symmetric enhancement and excretion of contrast by both kidneys. Bilateral renal cysts measure up to 2 cm on the right. The visualized ureters and urinary bladder appear unremarkable. Stomach/Bowel: There is sigmoid diverticulosis without active inflammatory changes. There is a large amount of stool throughout the colon. There is no bowel obstruction or active inflammation. Normal appendix. Vascular/Lymphatic: Moderate aortoiliac atherosclerotic disease. The IVC is unremarkable. No portal venous gas. There is no adenopathy. Reproductive: The prostate and seminal vesicles are grossly unremarkable. No pelvic mass. Other: None Musculoskeletal: Degenerative changes of the spine. No acute osseous pathology. Review of the MIP images confirms the above findings. IMPRESSION: 1. No CT evidence of pulmonary embolism. 2. Severe emphysema with bilateral lower lobe subpleural blebs. 3. No significant interval change in the rounded ground-glass opacity at the left lung base may represent scarring, atelectasis, or infiltrate. Continued follow-up recommended. 4. Small left pleural effusion. 5. Fatty liver. 6. Sigmoid diverticulosis. No bowel obstruction. Normal appendix. 7. Aortic Atherosclerosis (ICD10-I70.0) and Emphysema (ICD10-J43.9). Electronically Signed   By: AAnner CreteM.D.   On: 04/03/2020 20:14   CT Abdomen Pelvis W Contrast  Result Date: 04/03/2020 CLINICAL DATA:  70year old male with concern for pulmonary embolism. EXAM: CT ANGIOGRAPHY CHEST CT ABDOMEN AND PELVIS WITH CONTRAST  TECHNIQUE: Multidetector CT imaging of the chest was performed using the standard protocol during bolus administration of intravenous contrast. Multiplanar CT image reconstructions and MIPs were obtained  to evaluate the vascular anatomy. Multidetector CT imaging of the abdomen and pelvis was performed using the standard protocol during bolus administration of intravenous contrast. CONTRAST:  137m OMNIPAQUE IOHEXOL 350 MG/ML SOLN COMPARISON:  Chest CT dated 12/26/2019 and radiograph dated 04/03/2020. FINDINGS: CTA CHEST FINDINGS Cardiovascular: There is no cardiomegaly or pericardial effusion. There is coronary vascular calcification. Moderate atherosclerotic calcification of the thoracic aorta. No aneurysmal dilatation. Evaluation of the pulmonary arteries is limited due to suboptimal opacification and timing of the contrast. No definite pulmonary artery embolus identified. Mediastinum/Nodes: No hilar or mediastinal adenopathy. The esophagus and the thyroid gland are grossly unremarkable. No mediastinal fluid collection. Lungs/Pleura: Shallow inspiration. There is severe emphysematous changes of the lungs with bilateral lower lobe subpleural blebs. There is a 3.2 x 2.6 cm focus of ground-glass opacity at the left lung base which may represent scarring, atelectasis, or infiltrate. This is a centrally similar to prior CT. Continued follow-up recommended. No new consolidation. Small left pleural effusion. No pneumothorax. The central airways are patent. Musculoskeletal: Osteopenia with degenerative changes of the spine. No acute osseous pathology. Review of the MIP images confirms the above findings. CT ABDOMEN and PELVIS FINDINGS No intra-abdominal free air or free fluid. Hepatobiliary: There is fatty infiltration of the liver. No intrahepatic biliary dilatation. There is a 2.5 cm cyst in the right lobe of the liver. The gallbladder is unremarkable. Pancreas: Unremarkable. No pancreatic ductal dilatation or  surrounding inflammatory changes. Spleen: Normal in size without focal abnormality. Adrenals/Urinary Tract: The adrenal glands unremarkable. There is no hydronephrosis on either side. There is symmetric enhancement and excretion of contrast by both kidneys. Bilateral renal cysts measure up to 2 cm on the right. The visualized ureters and urinary bladder appear unremarkable. Stomach/Bowel: There is sigmoid diverticulosis without active inflammatory changes. There is a large amount of stool throughout the colon. There is no bowel obstruction or active inflammation. Normal appendix. Vascular/Lymphatic: Moderate aortoiliac atherosclerotic disease. The IVC is unremarkable. No portal venous gas. There is no adenopathy. Reproductive: The prostate and seminal vesicles are grossly unremarkable. No pelvic mass. Other: None Musculoskeletal: Degenerative changes of the spine. No acute osseous pathology. Review of the MIP images confirms the above findings. IMPRESSION: 1. No CT evidence of pulmonary embolism. 2. Severe emphysema with bilateral lower lobe subpleural blebs. 3. No significant interval change in the rounded ground-glass opacity at the left lung base may represent scarring, atelectasis, or infiltrate. Continued follow-up recommended. 4. Small left pleural effusion. 5. Fatty liver. 6. Sigmoid diverticulosis. No bowel obstruction. Normal appendix. 7. Aortic Atherosclerosis (ICD10-I70.0) and Emphysema (ICD10-J43.9). Electronically Signed   By: AAnner CreteM.D.   On: 04/03/2020 20:14   DG Chest Port 1 View  Result Date: 04/03/2020 CLINICAL DATA:  Shortness of breath EXAM: PORTABLE CHEST 1 VIEW COMPARISON:  Chest radiograph March 30, 2020. FINDINGS: The heart size and mediastinal contours are within normal limits. Chronic bilateral emphysematous changes. Left basilar airspace opacity. No pleural effusion. No pneumothorax. The visualized skeletal structures are unchanged. IMPRESSION: 1. Left basilar airspace  opacity suspicious for pneumonia. 2. Chronic bilateral emphysematous changes. Electronically Signed   By: JDahlia BailiffMD   On: 04/03/2020 18:42    Procedures Procedures   Medications Ordered in ED Medications  influenza vaccine adjuvanted (FLUAD) injection 0.5 mL (has no administration in time range)  albuterol (VENTOLIN HFA) 108 (90 Base) MCG/ACT inhaler 2 puff (2 puffs Inhalation Given 04/03/20 1803)  sodium chloride 0.9 % bolus 500 mL (500 mLs Intravenous New Bag/Given  04/03/20 1859)  ondansetron (ZOFRAN) injection 4 mg (4 mg Intravenous Given 04/03/20 1859)  iohexol (OMNIPAQUE) 350 MG/ML injection 100 mL (100 mLs Intravenous Contrast Given 04/03/20 1931)  cefTRIAXone (ROCEPHIN) 1 g in sodium chloride 0.9 % 100 mL IVPB (1 g Intravenous New Bag/Given 04/03/20 2132)  azithromycin (ZITHROMAX) tablet 500 mg (500 mg Oral Given 04/03/20 2131)    ED Course  I have reviewed the triage vital signs and the nursing notes.  Pertinent labs & imaging results that were available during my care of the patient were reviewed by me and considered in my medical decision making (see chart for details).    MDM Rules/Calculators/A&P                         patient with history of COPD, and vasculitis here for evaluation of abdominal pain, constipation. He is tachycardic, hypoxic on ED presentation. He denies any difficulty breathing. Lung exam with decreased air movement, no respiratory distress. He was treated with trial of albuterol, with no significant change in his symptoms. Chest x-ray with possible infiltrate. Given his health history A CTA PE study was obtained as well a CT abdomen pelvis to rule out intra-abdominal infection. CTA is negative for PE, demonstrates atelectasis versus scarring versus left lower lobe infiltrate. Given his new hypoxia, will treat with antibiotics for possible pneumonia. CBC with mild leukocytosis, slightly increased when compared to priors. CT abdomen pelvis is negative for acute  abnormality. Discussed with patient findings of studies, plan to admit given new oxygen requirement. Hospitalist consulted for admission.  Final Clinical Impression(s) / ED Diagnoses Final diagnoses:  None    Rx / DC Orders ED Discharge Orders    None       Quintella Reichert, MD 04/03/20 2216

## 2020-04-03 NOTE — ED Triage Notes (Signed)
BIBA  Per EMS pt hasn't had bowel movement x8 days  Pt was put on dilaudid last Friday  Vitals  92% 2L  98 CBG  20 RR 138/86  110 HR

## 2020-04-03 NOTE — ED Notes (Signed)
Pt given water at MD approval. Pt also requesting pain medicine. MD made aware.

## 2020-04-04 ENCOUNTER — Encounter (HOSPITAL_COMMUNITY): Payer: Self-pay | Admitting: Internal Medicine

## 2020-04-04 DIAGNOSIS — Z9114 Patient's other noncompliance with medication regimen: Secondary | ICD-10-CM | POA: Diagnosis not present

## 2020-04-04 DIAGNOSIS — J440 Chronic obstructive pulmonary disease with acute lower respiratory infection: Secondary | ICD-10-CM | POA: Diagnosis not present

## 2020-04-04 DIAGNOSIS — D849 Immunodeficiency, unspecified: Secondary | ICD-10-CM | POA: Diagnosis not present

## 2020-04-04 DIAGNOSIS — J431 Panlobular emphysema: Secondary | ICD-10-CM | POA: Diagnosis not present

## 2020-04-04 DIAGNOSIS — Z20822 Contact with and (suspected) exposure to covid-19: Secondary | ICD-10-CM | POA: Diagnosis present

## 2020-04-04 DIAGNOSIS — K5900 Constipation, unspecified: Secondary | ICD-10-CM | POA: Diagnosis not present

## 2020-04-04 DIAGNOSIS — Z79899 Other long term (current) drug therapy: Secondary | ICD-10-CM | POA: Diagnosis not present

## 2020-04-04 DIAGNOSIS — Z7984 Long term (current) use of oral hypoglycemic drugs: Secondary | ICD-10-CM | POA: Diagnosis not present

## 2020-04-04 DIAGNOSIS — H209 Unspecified iridocyclitis: Secondary | ICD-10-CM | POA: Diagnosis not present

## 2020-04-04 DIAGNOSIS — E1165 Type 2 diabetes mellitus with hyperglycemia: Secondary | ICD-10-CM

## 2020-04-04 DIAGNOSIS — J9621 Acute and chronic respiratory failure with hypoxia: Secondary | ICD-10-CM | POA: Diagnosis not present

## 2020-04-04 DIAGNOSIS — F32A Depression, unspecified: Secondary | ICD-10-CM | POA: Diagnosis present

## 2020-04-04 DIAGNOSIS — K5903 Drug induced constipation: Secondary | ICD-10-CM

## 2020-04-04 DIAGNOSIS — Z23 Encounter for immunization: Secondary | ICD-10-CM | POA: Diagnosis not present

## 2020-04-04 DIAGNOSIS — Z85828 Personal history of other malignant neoplasm of skin: Secondary | ICD-10-CM | POA: Diagnosis not present

## 2020-04-04 DIAGNOSIS — I776 Arteritis, unspecified: Secondary | ICD-10-CM | POA: Diagnosis not present

## 2020-04-04 DIAGNOSIS — I10 Essential (primary) hypertension: Secondary | ICD-10-CM | POA: Diagnosis present

## 2020-04-04 DIAGNOSIS — G4733 Obstructive sleep apnea (adult) (pediatric): Secondary | ICD-10-CM | POA: Diagnosis not present

## 2020-04-04 DIAGNOSIS — T40605A Adverse effect of unspecified narcotics, initial encounter: Secondary | ICD-10-CM | POA: Diagnosis not present

## 2020-04-04 DIAGNOSIS — G894 Chronic pain syndrome: Secondary | ICD-10-CM | POA: Diagnosis not present

## 2020-04-04 DIAGNOSIS — E119 Type 2 diabetes mellitus without complications: Secondary | ICD-10-CM | POA: Diagnosis not present

## 2020-04-04 DIAGNOSIS — J189 Pneumonia, unspecified organism: Secondary | ICD-10-CM | POA: Diagnosis present

## 2020-04-04 DIAGNOSIS — Z6841 Body Mass Index (BMI) 40.0 and over, adult: Secondary | ICD-10-CM | POA: Diagnosis not present

## 2020-04-04 DIAGNOSIS — Z87891 Personal history of nicotine dependence: Secondary | ICD-10-CM | POA: Diagnosis not present

## 2020-04-04 DIAGNOSIS — J9601 Acute respiratory failure with hypoxia: Secondary | ICD-10-CM | POA: Diagnosis not present

## 2020-04-04 DIAGNOSIS — I252 Old myocardial infarction: Secondary | ICD-10-CM | POA: Diagnosis not present

## 2020-04-04 LAB — GLUCOSE, CAPILLARY
Glucose-Capillary: 148 mg/dL — ABNORMAL HIGH (ref 70–99)
Glucose-Capillary: 228 mg/dL — ABNORMAL HIGH (ref 70–99)

## 2020-04-04 LAB — CBC
HCT: 43.7 % (ref 39.0–52.0)
Hemoglobin: 14.3 g/dL (ref 13.0–17.0)
MCH: 32.6 pg (ref 26.0–34.0)
MCHC: 32.7 g/dL (ref 30.0–36.0)
MCV: 99.5 fL (ref 80.0–100.0)
Platelets: 270 10*3/uL (ref 150–400)
RBC: 4.39 MIL/uL (ref 4.22–5.81)
RDW: 13.2 % (ref 11.5–15.5)
WBC: 13.5 10*3/uL — ABNORMAL HIGH (ref 4.0–10.5)
nRBC: 0 % (ref 0.0–0.2)

## 2020-04-04 LAB — BASIC METABOLIC PANEL
Anion gap: 5 (ref 5–15)
Anion gap: 12 (ref 5–15)
BUN: 13 mg/dL (ref 8–23)
BUN: 15 mg/dL (ref 8–23)
CO2: 17 mmol/L — ABNORMAL LOW (ref 22–32)
CO2: 20 mmol/L — ABNORMAL LOW (ref 22–32)
Calcium: 5.6 mg/dL — CL (ref 8.9–10.3)
Calcium: 8.5 mg/dL — ABNORMAL LOW (ref 8.9–10.3)
Chloride: 112 mmol/L — ABNORMAL HIGH (ref 98–111)
Chloride: 102 mmol/L (ref 98–111)
Creatinine, Ser: 0.7 mg/dL (ref 0.61–1.24)
Creatinine, Ser: 0.87 mg/dL (ref 0.61–1.24)
GFR, Estimated: >60 mL/min (ref >60)
GFR, Estimated: >60 mL/min (ref >60)
Glucose, Bld: 120 mg/dL — ABNORMAL HIGH (ref 70–99)
Glucose, Bld: 155 mg/dL — ABNORMAL HIGH (ref 70–99)
Potassium: 2.7 mmol/L — CL (ref 3.5–5.1)
Potassium: 4.1 mmol/L (ref 3.5–5.1)
Sodium: 134 mmol/L — ABNORMAL LOW (ref 135–145)
Sodium: 134 mmol/L — ABNORMAL LOW (ref 135–145)

## 2020-04-04 LAB — C-REACTIVE PROTEIN: CRP: 1.2 mg/dL — ABNORMAL HIGH (ref <1.0)

## 2020-04-04 LAB — SEDIMENTATION RATE: Sed Rate: 9 mm/hr (ref 0–16)

## 2020-04-04 LAB — CBG MONITORING, ED
Glucose-Capillary: 198 mg/dL — ABNORMAL HIGH (ref 70–99)
Glucose-Capillary: 158 mg/dL — ABNORMAL HIGH (ref 70–99)

## 2020-04-04 MED ORDER — PREDNISONE 20 MG PO TABS
20.0000 mg | ORAL_TABLET | Freq: Every day | ORAL | Status: AC
  Administered 2020-04-04 – 2020-04-06 (×3): 20 mg via ORAL
  Filled 2020-04-04 (×3): qty 1

## 2020-04-04 MED ORDER — POLYVINYL ALCOHOL 1.4 % OP SOLN
1.0000 [drp] | Freq: Every day | OPHTHALMIC | Status: DC
  Administered 2020-04-04 – 2020-04-07 (×3): 1 [drp] via OPHTHALMIC
  Filled 2020-04-04 (×2): qty 15

## 2020-04-04 MED ORDER — LISINOPRIL 20 MG PO TABS
20.0000 mg | ORAL_TABLET | Freq: Every day | ORAL | Status: DC
  Administered 2020-04-04 – 2020-04-07 (×4): 20 mg via ORAL
  Filled 2020-04-04 (×4): qty 1

## 2020-04-04 MED ORDER — UMECLIDINIUM-VILANTEROL 62.5-25 MCG/INH IN AEPB
1.0000 | INHALATION_SPRAY | Freq: Every day | RESPIRATORY_TRACT | Status: DC
  Administered 2020-04-05 – 2020-04-07 (×3): 1 via RESPIRATORY_TRACT
  Filled 2020-04-04: qty 14

## 2020-04-04 MED ORDER — MORPHINE SULFATE (PF) 2 MG/ML IV SOLN
2.0000 mg | Freq: Once | INTRAVENOUS | Status: AC
Start: 2020-04-04 — End: 2020-04-04
  Administered 2020-04-04: 2 mg via INTRAVENOUS
  Filled 2020-04-04: qty 1

## 2020-04-04 MED ORDER — ACETAMINOPHEN 650 MG RE SUPP: 650.0000 mg | Freq: Four times a day (QID) | RECTAL | Status: DC | PRN

## 2020-04-04 MED ORDER — SENNA 8.6 MG PO TABS
1.0000 | ORAL_TABLET | Freq: Every day | ORAL | Status: DC
  Administered 2020-04-04 (×2): 8.6 mg via ORAL
  Filled 2020-04-04 (×2): qty 1

## 2020-04-04 MED ORDER — MYCOPHENOLATE MOFETIL 250 MG PO CAPS
1500.0000 mg | ORAL_CAPSULE | Freq: Two times a day (BID) | ORAL | Status: DC
  Administered 2020-04-04 – 2020-04-07 (×8): 1500 mg via ORAL
  Filled 2020-04-04 (×8): qty 6

## 2020-04-04 MED ORDER — POLYETHYLENE GLYCOL 3350 17 G PO PACK
17.0000 g | PACK | Freq: Two times a day (BID) | ORAL | Status: DC
  Administered 2020-04-04 – 2020-04-07 (×6): 17 g via ORAL
  Filled 2020-04-04 (×6): qty 1

## 2020-04-04 MED ORDER — FOLIC ACID 1 MG PO TABS
1.0000 mg | ORAL_TABLET | Freq: Every day | ORAL | Status: DC
  Administered 2020-04-04 – 2020-04-07 (×4): 1 mg via ORAL
  Filled 2020-04-04 (×4): qty 1

## 2020-04-04 MED ORDER — BISACODYL 10 MG RE SUPP
10.0000 mg | Freq: Once | RECTAL | Status: AC
  Administered 2020-04-04: 10 mg via RECTAL
  Filled 2020-04-04: qty 1

## 2020-04-04 MED ORDER — MELATONIN 3 MG PO TABS
6.0000 mg | ORAL_TABLET | Freq: Every day | ORAL | Status: DC
  Administered 2020-04-04 – 2020-04-06 (×4): 6 mg via ORAL
  Filled 2020-04-04 (×4): qty 2

## 2020-04-04 MED ORDER — GABAPENTIN 300 MG PO CAPS
900.0000 mg | ORAL_CAPSULE | Freq: Every day | ORAL | Status: DC
  Administered 2020-04-04 – 2020-04-06 (×4): 900 mg via ORAL
  Filled 2020-04-04 (×4): qty 3

## 2020-04-04 MED ORDER — TRAZODONE HCL 50 MG PO TABS: 50.0000 mg | ORAL_TABLET | Freq: Every evening | ORAL | Status: DC | PRN

## 2020-04-04 MED ORDER — METHOTREXATE 2.5 MG PO TABS: 15.0000 mg | ORAL_TABLET | ORAL | Status: DC

## 2020-04-04 MED ORDER — BISACODYL 10 MG RE SUPP: 10.0000 mg | Freq: Once | RECTAL | Status: DC

## 2020-04-04 MED ORDER — POLYETHYLENE GLYCOL 3350 17 G PO PACK
17.0000 g | PACK | Freq: Every day | ORAL | Status: DC
  Administered 2020-04-04: 17 g via ORAL
  Filled 2020-04-04: qty 1

## 2020-04-04 MED ORDER — ACETAMINOPHEN 325 MG PO TABS
650.0000 mg | ORAL_TABLET | Freq: Four times a day (QID) | ORAL | Status: DC | PRN
  Administered 2020-04-04: 650 mg via ORAL
  Filled 2020-04-04: qty 2

## 2020-04-04 MED ORDER — LURASIDONE HCL 40 MG PO TABS
40.0000 mg | ORAL_TABLET | Freq: Every day | ORAL | Status: DC
  Administered 2020-04-04 – 2020-04-06 (×3): 40 mg via ORAL
  Filled 2020-04-04 (×5): qty 1

## 2020-04-04 MED ORDER — AZITHROMYCIN 500 MG IV SOLR
500.0000 mg | INTRAVENOUS | Status: DC
  Administered 2020-04-04 – 2020-04-05 (×2): 500 mg via INTRAVENOUS
  Filled 2020-04-04 (×3): qty 500

## 2020-04-04 MED ORDER — CEFTRIAXONE SODIUM 2 G IJ SOLR
2.0000 g | INTRAMUSCULAR | Status: DC
  Administered 2020-04-05: 2 g via INTRAVENOUS
  Filled 2020-04-04: qty 20

## 2020-04-04 MED ORDER — DULOXETINE HCL 60 MG PO CPEP
60.0000 mg | ORAL_CAPSULE | Freq: Two times a day (BID) | ORAL | Status: DC
  Administered 2020-04-04 – 2020-04-07 (×7): 60 mg via ORAL
  Filled 2020-04-04 (×2): qty 2
  Filled 2020-04-04 (×6): qty 1

## 2020-04-04 MED ORDER — ETANERCEPT 50 MG/ML ~~LOC~~ SOSY: 50.0000 mg | PREFILLED_SYRINGE | SUBCUTANEOUS | Status: DC

## 2020-04-04 MED ORDER — INSULIN ASPART 100 UNIT/ML ~~LOC~~ SOLN
0.0000 [IU] | Freq: Three times a day (TID) | SUBCUTANEOUS | Status: DC
  Administered 2020-04-04 (×2): 2 [IU] via SUBCUTANEOUS
  Administered 2020-04-04: 1 [IU] via SUBCUTANEOUS
  Administered 2020-04-05: 2 [IU] via SUBCUTANEOUS
  Administered 2020-04-05: 3 [IU] via SUBCUTANEOUS
  Administered 2020-04-05: 2 [IU] via SUBCUTANEOUS
  Administered 2020-04-06: 1 [IU] via SUBCUTANEOUS
  Administered 2020-04-06 (×2): 3 [IU] via SUBCUTANEOUS
  Administered 2020-04-07 (×2): 2 [IU] via SUBCUTANEOUS
  Filled 2020-04-04: qty 0.09

## 2020-04-04 MED ORDER — OXCARBAZEPINE 300 MG PO TABS
600.0000 mg | ORAL_TABLET | Freq: Three times a day (TID) | ORAL | Status: DC
  Administered 2020-04-04 – 2020-04-07 (×10): 600 mg via ORAL
  Filled 2020-04-04 (×11): qty 2

## 2020-04-04 MED ORDER — DILTIAZEM HCL ER COATED BEADS 180 MG PO CP24
180.0000 mg | ORAL_CAPSULE | Freq: Every day | ORAL | Status: DC
  Administered 2020-04-04 – 2020-04-07 (×4): 180 mg via ORAL
  Filled 2020-04-04 (×4): qty 1

## 2020-04-04 MED ORDER — TAMSULOSIN HCL 0.4 MG PO CAPS
0.4000 mg | ORAL_CAPSULE | Freq: Every day | ORAL | Status: DC
  Administered 2020-04-04 – 2020-04-07 (×4): 0.4 mg via ORAL
  Filled 2020-04-04 (×4): qty 1

## 2020-04-04 MED ORDER — PROPYLENE GLYCOL 0.6 % OP SOLN: 1.0000 [drp] | Freq: Every day | OPHTHALMIC | Status: DC

## 2020-04-04 MED ORDER — NITROGLYCERIN 0.4 MG SL SUBL: 0.4000 mg | SUBLINGUAL_TABLET | SUBLINGUAL | Status: DC | PRN

## 2020-04-04 MED ORDER — DOCUSATE SODIUM 100 MG PO CAPS
100.0000 mg | ORAL_CAPSULE | Freq: Every day | ORAL | Status: DC
  Administered 2020-04-04 – 2020-04-07 (×4): 100 mg via ORAL
  Filled 2020-04-04 (×4): qty 1

## 2020-04-04 MED ORDER — ENOXAPARIN SODIUM 60 MG/0.6ML ~~LOC~~ SOLN
60.0000 mg | SUBCUTANEOUS | Status: DC
  Administered 2020-04-04 – 2020-04-07 (×4): 60 mg via SUBCUTANEOUS
  Filled 2020-04-04 (×4): qty 0.6

## 2020-04-04 MED ORDER — BUSPIRONE HCL 5 MG PO TABS
15.0000 mg | ORAL_TABLET | Freq: Every day | ORAL | Status: DC
  Administered 2020-04-04 – 2020-04-07 (×4): 15 mg via ORAL
  Filled 2020-04-04: qty 3
  Filled 2020-04-04: qty 2
  Filled 2020-04-04 (×2): qty 3

## 2020-04-04 MED ORDER — SENNOSIDES-DOCUSATE SODIUM 8.6-50 MG PO TABS
1.0000 | ORAL_TABLET | Freq: Two times a day (BID) | ORAL | Status: DC
  Administered 2020-04-04 – 2020-04-07 (×7): 1 via ORAL
  Filled 2020-04-04 (×7): qty 1

## 2020-04-04 MED ORDER — TRAZODONE HCL 50 MG PO TABS
75.0000 mg | ORAL_TABLET | Freq: Every day | ORAL | Status: DC
  Administered 2020-04-04 – 2020-04-06 (×4): 75 mg via ORAL
  Filled 2020-04-04 (×4): qty 2

## 2020-04-04 MED ORDER — CYCLOBENZAPRINE HCL 10 MG PO TABS
10.0000 mg | ORAL_TABLET | Freq: Every day | ORAL | Status: DC
  Administered 2020-04-04 – 2020-04-06 (×4): 10 mg via ORAL
  Filled 2020-04-04 (×4): qty 1

## 2020-04-04 NOTE — ED Notes (Signed)
Pt had moderate-sized BM after soap suds enema. Pt states that "I still feel like I have a lot in there". Provider notified

## 2020-04-04 NOTE — ED Notes (Signed)
Report called to floor

## 2020-04-04 NOTE — Progress Notes (Signed)
Patient seen and examined personally, I reviewed the chart, history and physical and admission note, done by admitting physician this morning and agree with the same with following addendum.  Please refer to the morning admission note for more detailed plan of care.  Briefly, 70 year old male with history of COPD, sleep apnea, morbid obesity with BMI 42, hypertension, diabetes mellitus, chronic pain, depression, vasculitis/uveitis and immunosuppressant presented to the ED with constipation x8 days after starting pain medication for lower extremity pain around the same time.  Patient denied chest pain shortness of breath In the ED CT chest abdomen pelvis was done showing features concerning for infiltrates in the left lower side concerning for possible pneumonia otherwise CT abdomen was unremarkable lab work showed leukocytosis 15 K, was also hypoxic in the ED needing 4 L oxygen.  Given the possibility of pneumonia on the CT and hypoxia and that patient is immunosuppressive patient was placed on antibiotics and admission was requested for further management.  Patient was given soapsuds enema for constipation and had a bowel movement x1.  Patient seen and examined in the ED, Alert,awake, on 4l [place] spo2 at 93%, having his meal, low abdomen discomfort but no nausea vomiting, passing gas, had BM earlier this morning  Issues Acute hypoxic respiratory failure due to pneumonia in the setting of COPD.  No PE on CTA but has severe emphysema suspect component of mild acute exacerbation so starting with steroid course, cont nebs . He uses 2l [place] in night w/ CPAP.  Continue supplemental oxygen, bronchodilators.  Possible community-acquired pneumonia, needing oxygen as above, leukocytosis downtrending, is on ceftriaxone/erythromycin.  Keep current antibiotics monitor labs Recent Labs  Lab 03/30/20 2034 04/03/20 1718 04/04/20 0337  WBC 13.2* 15.2* 13.5*   Constipation x8 days PTA status post subset enema in the  ED, with bowel movement x1, and senna Dulcolax  If needed- can try Naloxogel. reprots he ran out of his miralax 3 wks agoe then started having " CRAPPING BM for 3 wks, and no BM last 8 days" PTA  COPD w/ mild exacerbation- diminished breath sound, given pneumonia suspect mild exacerbations will keep on short course of steroids. Cont inhalers.  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 History of uveitis-as per above patient has been on this medication for some time and not holding for now. OSA on CPAP at bedtime Hypertension: Blood pressure controlled on lisinopril and Cardizem Depression/psy issues: Continue home Latuda, Trileptal, BuSpar and Cymbalta Chronic pain gabapentin/Cymbalta and also tramadol Morbid obesity w/ BMI 42, will benefit w/ wt loss

## 2020-04-04 NOTE — ED Notes (Signed)
Pt provided sandwich and water. 

## 2020-04-04 NOTE — H&P (Signed)
 History and Physical    Mario [name] MRN:[id number] DOB: 01/19/1951 DOA: 04/03/2020  PCP: Clinic, [facility]  Patient coming from: Home.  Chief Complaint: Constipation.  HPI: Mario [name] is a 69 y.o. 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  Denies any nausea vomiting has some abdominal discomfort.  Denies any chest pain or productive cough or shortness of breath.  ED Course: In the ER patient underwent CT chest abdomen pelvis which was showing features concerning for infiltrates in the left lower side concerning for possible pneumonia and CT abdomen was unremarkable.  Labs were showing mild leukocytosis of 15.  Patient was hypoxic in the ER requiring 4 L oxygen with the CT scan finding concerning for infiltrates and patient immunosuppressed state patient was started on antibiotics.  Patient also ordered enema for the constipation.  Admitted for further observation.  Covid test is negative.  EKG shows sinus tachycardia.  Review of Systems: As per HPI, rest all negative.   Past Medical History:  Diagnosis Date  . Anxiety   . Bronchitis   . COPD (chronic obstructive pulmonary disease) (HCC)   . Depression   . Hypertension   . Mental disorder   . MI (myocardial infarction) (HCC)    ????  . OSA (obstructive sleep apnea)   . Suicide attempt (HCC)   . Tension pneumothorax 06/27/2016  . Uveitis     Past Surgical History:  Procedure Laterality Date  . CHEST TUBE INSERTION Left 06/27/2016  . cryptorchidism    . SKIN CANCER EXCISION       reports that he quit smoking about 3 years ago. His smoking use included cigarettes. He has a 35.00 pack-year smoking history. He has never used  smokeless tobacco. He reports that he does not drink alcohol and does not use drugs.  Allergies  Allergen Reactions  . Demerol [Meperidine] Nausea And Vomiting and Other (See Comments)    Made the patient "violently sick"  . Zocor [Simvastatin] Nausea And Vomiting and Other (See Comments)    Made him very jittery, also  . Beet [Beta Vulgaris] Nausea And Vomiting  . Liver Nausea And Vomiting    Family History  Problem Relation Age of Onset  . Dementia Father     Prior to Admission medications   Medication Sig Start Date End Date Taking? Authorizing Provider  acetaminophen (TYLENOL) 500 MG tablet Take 1,000 mg by mouth every 6 (six) hours as needed for moderate pain.   Yes [provider]  b complex vitamins capsule Take 1 capsule by mouth daily.   Yes [provider]  busPIRone (BUSPAR) 15 MG tablet Take 15 mg by mouth daily.    Yes [provider]  calcium-vitamin D (OSCAL WITH D) 500-200 MG-UNIT tablet Take 1 tablet by mouth 2 (two) times daily with a meal.   Yes [provider]  Cholecalciferol (VITAMIN D-3) 25 MCG (1000 UT) CAPS Take 1,000 Units by mouth daily.   Yes [provider]  cyclobenzaprine (FLEXERIL) 10 MG tablet Take 10 mg by mouth at bedtime.   Yes [provider]  diltiazem (CARDIZEM CD) 180 MG 24 hr capsule Take 180 mg by mouth daily.   Yes [provider]    docusate calcium (SURFAK) 240 MG capsule Take 240 mg by mouth daily.   Yes [provider]  DULoxetine (CYMBALTA) 60 MG capsule Take 1 capsule (60 mg total) by mouth 2 (two) times daily. Patient taking differently: Take 60 mg by mouth 2 (two) times daily with a meal. 07/21/17  Yes Patel, Pranav M, MD  etanercept (ENBREL) 50 MG/ML injection Inject 50 mg into the skin every Wednesday.   Yes [provider]  fluticasone (FLONASE) 50 MCG/ACT nasal spray Place 2 sprays into both nostrils daily.    Yes [provider]  folic acid  (FOLVITE) 1 MG tablet Take 1 mg by mouth daily.   Yes [provider]  gabapentin (NEURONTIN) 300 MG capsule Take 2 capsules (600 mg total) by mouth 2 (two) times daily. Patient taking differently: Take 900 mg by mouth at bedtime. 07/06/16  Yes Zimmerman, Donielle M, PA-C  HYDROmorphone (DILAUDID) 2 MG tablet Take 0.5 tablets (1 mg total) by mouth every 6 (six) hours as needed for severe pain. 03/27/20  Yes Lockwood, Rashaud, MD  lisinopril (ZESTRIL) 20 MG tablet Take 20 mg by mouth daily.   Yes [provider]  lurasidone (LATUDA) 40 MG TABS tablet Take 40 mg by mouth daily after supper.   Yes [provider]  Melatonin 3 MG TABS Take 6 mg by mouth at bedtime.   Yes [provider]  metFORMIN (GLUCOPHAGE) 500 MG tablet Take 500 mg by mouth 2 (two) times daily with a meal.   Yes [provider]  methotrexate (RHEUMATREX) 2.5 MG tablet Take 6 tablets (15 mg total) by mouth every Saturday. Hold until seen by rheumatology  Caution:Chemotherapy. Protect from light. Patient taking differently: Take 15 mg by mouth every Saturday. 07/22/17  Yes Patel, Pranav M, MD  Multiple Vitamin (MULTIVITAMIN WITH MINERALS) TABS tablet Take 1 tablet by mouth daily.   Yes [provider]  mycophenolate (CELLCEPT) 250 MG capsule Take 1,500 mg by mouth 2 (two) times daily.   Yes [provider]  OVER THE COUNTER MEDICATION Place 1 drop into both ears daily. Miracell ProEar for irritated itchy ears   Yes [provider]  OVER THE COUNTER MEDICATION Take 2 tablets by mouth daily. Pure Zzz   Yes [provider]  Oxcarbazepine (TRILEPTAL) 300 MG tablet Take 600 mg by mouth 3 (three) times daily.   Yes [provider]  polyethylene glycol (MIRALAX / GLYCOLAX) 17 g packet Take 17 g by mouth daily.   Yes [provider]  PRESCRIPTION MEDICATION Inhale into the lungs See admin instructions. CPAP- At bedtime and during during any naps    Yes [provider]  Propylene Glycol (SYSTANE BALANCE) 0.6 % SOLN Place 1 drop into both eyes in the morning, at noon, and at bedtime.    Yes [provider]  tamsulosin (FLOMAX) 0.4 MG CAPS capsule Take 0.4 mg by mouth daily.   Yes [provider]  Tiotropium Bromide-Olodaterol (STIOLTO RESPIMAT) 2.5-2.5 MCG/ACT AERS Inhale 2 puffs into the lungs daily.   Yes [provider]  traMADol (ULTRAM) 50 MG tablet Take 100 mg by mouth at bedtime.   Yes [provider]  traZODone (DESYREL) 50 MG tablet Take 75 mg by mouth at bedtime.   Yes [provider]  VITAMIN A PO Take 1 tablet by mouth daily.   Yes [provider]  vitamin C (ASCORBIC ACID) 500 MG tablet Take 500 mg by mouth 2 (two) times daily.      Yes [provider]  nitroGLYCERIN (NITROSTAT) 0.4 MG SL tablet Place 1 tablet (0.4 mg total) under the tongue every 5 (five) minutes as needed for chest pain. 07/03/19   Danford, Suann Larry, MD  sodium chloride (OCEAN) 0.65 % SOLN nasal spray Place 2 sprays into both nostrils 4 (four) times daily as needed for congestion.    [provider]    Physical Exam: Constitutional: Moderately built and nourished. Vitals:   04/03/20 1709 04/03/20 1900 04/03/20 1917  BP: 123/69  136/70  Pulse: (!) 109 93 92  Resp: (!) _0 Temp: 98.5 F (36.9 C)    TempSrc: Oral    SpO2: 94% 91% 92%   Eyes: Anicteric no pallor. ENMT: No discharge from the ears eyes nose or mouth. Neck: No mass felt.  No neck rigidity. Respiratory: No rhonchi or crepitations. Cardiovascular: S1-S2 heard. Abdomen: Soft nontender mildly distended no guarding or rigidity. Musculoskeletal: No edema. Skin: Rash in the right foot. Neurologic: Alert awake oriented to time place and person.  Moves all extremities. Psychiatric: Appears normal.   Labs on Admission: I have personally reviewed following labs and imaging studies  CBC: Recent Labs  Lab  03/30/20 2034 04/03/20 1718  WBC 13.2* 15.2*  NEUTROABS  --  9.6*  HGB 15.2 16.9  HCT 46.5 50.1  MCV 100.4* 98.0  PLT 302 614   Basic Metabolic Panel: Recent Labs  Lab 03/30/20 2034 04/03/20 1718  NA 138 139  K 4.3 4.5  CL 103 100  CO2 21* 24  GLUCOSE 116* 98  BUN 19 18  CREATININE 1.08 1.22  CALCIUM 9.2 9.6   GFR: Estimated Creatinine Clearance: 89.5 mL/min (by C-G formula based on SCr of 1.22 mg/dL). Liver Function Tests: Recent Labs  Lab 03/30/20 2034 04/03/20 1718  AST 25 28  ALT 29 37  ALKPHOS 51 56  BILITOT 0.3 0.6  PROT 6.7 7.6  ALBUMIN 4.1 4.5   No results for input(s): LIPASE, AMYLASE in the last 168 hours. No results for input(s): AMMONIA in the last 168 hours. Coagulation Profile: No results for input(s): INR, PROTIME in the last 168 hours. Cardiac Enzymes: No results for input(s): CKTOTAL, CKMB, CKMBINDEX, TROPONINI in the last 168 hours. BNP (last 3 results) No results for input(s): PROBNP in the last 8760 hours. HbA1C: No results for input(s): HGBA1C in the last 72 hours. CBG: Recent Labs  Lab 03/31/20 0739 03/31/20 1709  GLUCAP 230* 168*   Lipid Profile: No results for input(s): CHOL, HDL, LDLCALC, TRIG, CHOLHDL, LDLDIRECT in the last 72 hours. Thyroid Function Tests: No results for input(s): TSH, T4TOTAL, FREET4, T3FREE, THYROIDAB in the last 72 hours. Anemia Panel: No results for input(s): VITAMINB12, FOLATE, FERRITIN, TIBC, IRON, RETICCTPCT in the last 72 hours. Urine analysis:    Component Value Date/Time   COLORURINE AMBER (A) 04/03/2020 1718   APPEARANCEUR CLEAR 04/03/2020 1718   LABSPEC 1.029 04/03/2020 1718   PHURINE 6.0 04/03/2020 1718   GLUCOSEU NEGATIVE 04/03/2020 1718   HGBUR NEGATIVE 04/03/2020 1718   BILIRUBINUR NEGATIVE 04/03/2020 1718   KETONESUR 5 (A) 04/03/2020 1718   PROTEINUR NEGATIVE 04/03/2020 1718   UROBILINOGEN 1.0 01/12/2013 0024   NITRITE NEGATIVE 04/03/2020 1718   LEUKOCYTESUR NEGATIVE 04/03/2020 1718    Sepsis Labs: _1 (procalcitonin:4,lacticidven:4) ) Recent Results (from the past 240 hour(s))  SARS Coronavirus 2 by RT PCR (hospital order, performed in [place] hospital lab) Nasopharyngeal Nasopharyngeal Swab     Status: None   Collection Time: 03/30/20  8:35 PM   Specimen: Nasopharyngeal Swab  Result Value Ref Range Status   SARS Coronavirus 2 NEGATIVE NEGATIVE Final    Comment: (NOTE) SARS-CoV-2 target nucleic acids are NOT DETECTED.  The SARS-CoV-2 RNA is generally detectable in upper and lower respiratory specimens during the acute phase of infection. The lowest concentration of SARS-CoV-2 viral copies this assay can detect is 250 copies / mL. A negative result does not preclude SARS-CoV-2 infection and should not be used as the sole basis for treatment or other patient management decisions.  A negative result may occur with improper specimen collection / handling, submission of specimen other than nasopharyngeal swab, presence of viral mutation(s) within the areas targeted by this assay, and inadequate number of viral copies (<250 copies / mL). A negative result must be combined with clinical observations, patient history, and epidemiological information.  Fact Sheet for Patients:   https://www.fda.gov/media/136312/download  Fact Sheet for Healthcare Providers: https://www.fda.gov/media/136313/download  This test is not yet approved or  cleared by the [place] FDA and has been authorized for detection and/or diagnosis of SARS-CoV-2 by FDA under an Emergency Use Authorization (EUA).  This EUA will remain in effect (meaning this test can be used) for the duration of the COVID-19 declaration under Section 564(b)(1) of the Act, 21 U.S.C. section 360bbb-3(b)(1), unless the authorization is terminated or revoked sooner.  Performed at [facility], [address] [place], [place] [address]   SARS Coronavirus 2 by RT PCR (hospital order,  performed in [facility] hospital lab) Nasopharyngeal Nasopharyngeal Swab     Status: None   Collection Time: 04/03/20  9:40 PM   Specimen: Nasopharyngeal Swab  Result Value Ref Range Status   SARS Coronavirus 2 NEGATIVE NEGATIVE Final    Comment: (NOTE) SARS-CoV-2 target nucleic acids are NOT DETECTED.  The SARS-CoV-2 RNA is generally detectable in upper and lower respiratory specimens during the acute phase of infection. The lowest concentration of SARS-CoV-2 viral copies this assay can detect is 250 copies / mL. A negative result does not preclude SARS-CoV-2 infection and should not be used as the sole basis for treatment or other patient management decisions.  A negative result may occur with improper specimen collection / handling, submission of specimen other than nasopharyngeal swab, presence of viral mutation(s) within the areas targeted by this assay, and inadequate number of viral copies (<250 copies / mL). A negative result must be combined with clinical observations, patient history, and epidemiological information.  Fact Sheet for Patients:   https://www.fda.gov/media/136312/download  Fact Sheet for Healthcare Providers: https://www.fda.gov/media/136313/download  This test is not yet approved or  cleared by the [place] FDA and has been authorized for detection and/or diagnosis of SARS-CoV-2 by FDA under an Emergency Use Authorization (EUA).  This EUA will remain in effect (meaning this test can be used) for the duration of the COVID-19 declaration under Section 564(b)(1) of the Act, 21 U.S.C. section 360bbb-3(b)(1), unless the authorization is terminated or revoked sooner.  Performed at [facility], [address] [place], [place] [address]      Radiological Exams on Admission: CT Angio Chest PE W/Cm &/Or Wo Cm  Result Date: 04/03/2020 CLINICAL DATA:  69-year-old male with concern for pulmonary embolism. EXAM: CT ANGIOGRAPHY CHEST CT  ABDOMEN AND PELVIS WITH CONTRAST TECHNIQUE: Multidetector CT imaging of the chest was performed using the standard protocol during bolus administration of intravenous contrast. Multiplanar CT image reconstructions and MIPs were obtained to evaluate the vascular anatomy. Multidetector CT   imaging of the abdomen and pelvis was performed using the standard protocol during bolus administration of intravenous contrast. CONTRAST:  131m OMNIPAQUE IOHEXOL 350 MG/ML SOLN COMPARISON:  Chest CT dated 12/26/2019 and radiograph dated 04/03/2020. FINDINGS: CTA CHEST FINDINGS Cardiovascular: There is no cardiomegaly or pericardial effusion. There is coronary vascular calcification. Moderate atherosclerotic calcification of the thoracic aorta. No aneurysmal dilatation. Evaluation of the pulmonary arteries is limited due to suboptimal opacification and timing of the contrast. No definite pulmonary artery embolus identified. Mediastinum/Nodes: No hilar or mediastinal adenopathy. The esophagus and the thyroid gland are grossly unremarkable. No mediastinal fluid collection. Lungs/Pleura: Shallow inspiration. There is severe emphysematous changes of the lungs with bilateral lower lobe subpleural blebs. There is a 3.2 x 2.6 cm focus of ground-glass opacity at the left lung base which may represent scarring, atelectasis, or infiltrate. This is a centrally similar to prior CT. Continued follow-up recommended. No new consolidation. Small left pleural effusion. No pneumothorax. The central airways are patent. Musculoskeletal: Osteopenia with degenerative changes of the spine. No acute osseous pathology. Review of the MIP images confirms the above findings. CT ABDOMEN and PELVIS FINDINGS No intra-abdominal free air or free fluid. Hepatobiliary: There is fatty infiltration of the liver. No intrahepatic biliary dilatation. There is a 2.5 cm cyst in the right lobe of the liver. The gallbladder is unremarkable. Pancreas: Unremarkable. No  pancreatic ductal dilatation or surrounding inflammatory changes. Spleen: Normal in size without focal abnormality. Adrenals/Urinary Tract: The adrenal glands unremarkable. There is no hydronephrosis on either side. There is symmetric enhancement and excretion of contrast by both kidneys. Bilateral renal cysts measure up to 2 cm on the right. The visualized ureters and urinary bladder appear unremarkable. Stomach/Bowel: There is sigmoid diverticulosis without active inflammatory changes. There is a large amount of stool throughout the colon. There is no bowel obstruction or active inflammation. Normal appendix. Vascular/Lymphatic: Moderate aortoiliac atherosclerotic disease. The IVC is unremarkable. No portal venous gas. There is no adenopathy. Reproductive: The prostate and seminal vesicles are grossly unremarkable. No pelvic mass. Other: None Musculoskeletal: Degenerative changes of the spine. No acute osseous pathology. Review of the MIP images confirms the above findings. IMPRESSION: 1. No CT evidence of pulmonary embolism. 2. Severe emphysema with bilateral lower lobe subpleural blebs. 3. No significant interval change in the rounded ground-glass opacity at the left lung base may represent scarring, atelectasis, or infiltrate. Continued follow-up recommended. 4. Small left pleural effusion. 5. Fatty liver. 6. Sigmoid diverticulosis. No bowel obstruction. Normal appendix. 7. Aortic Atherosclerosis (ICD10-I70.0) and Emphysema (ICD10-J43.9). Electronically Signed   By: AAnner CreteM.D.   On: 04/03/2020 20:14   CT Abdomen Pelvis W Contrast  Result Date: 04/03/2020 CLINICAL DATA:  70year old male with concern for pulmonary embolism. EXAM: CT ANGIOGRAPHY CHEST CT ABDOMEN AND PELVIS WITH CONTRAST TECHNIQUE: Multidetector CT imaging of the chest was performed using the standard protocol during bolus administration of intravenous contrast. Multiplanar CT image reconstructions and MIPs were obtained to evaluate  the vascular anatomy. Multidetector CT imaging of the abdomen and pelvis was performed using the standard protocol during bolus administration of intravenous contrast. CONTRAST:  1045mOMNIPAQUE IOHEXOL 350 MG/ML SOLN COMPARISON:  Chest CT dated 12/26/2019 and radiograph dated 04/03/2020. FINDINGS: CTA CHEST FINDINGS Cardiovascular: There is no cardiomegaly or pericardial effusion. There is coronary vascular calcification. Moderate atherosclerotic calcification of the thoracic aorta. No aneurysmal dilatation. Evaluation of the pulmonary arteries is limited due to suboptimal opacification and timing of the contrast. No definite pulmonary artery embolus identified.  Mediastinum/Nodes: No hilar or mediastinal adenopathy. The esophagus and the thyroid gland are grossly unremarkable. No mediastinal fluid collection. Lungs/Pleura: Shallow inspiration. There is severe emphysematous changes of the lungs with bilateral lower lobe subpleural blebs. There is a 3.2 x 2.6 cm focus of ground-glass opacity at the left lung base which may represent scarring, atelectasis, or infiltrate. This is a centrally similar to prior CT. Continued follow-up recommended. No new consolidation. Small left pleural effusion. No pneumothorax. The central airways are patent. Musculoskeletal: Osteopenia with degenerative changes of the spine. No acute osseous pathology. Review of the MIP images confirms the above findings. CT ABDOMEN and PELVIS FINDINGS No intra-abdominal free air or free fluid. Hepatobiliary: There is fatty infiltration of the liver. No intrahepatic biliary dilatation. There is a 2.5 cm cyst in the right lobe of the liver. The gallbladder is unremarkable. Pancreas: Unremarkable. No pancreatic ductal dilatation or surrounding inflammatory changes. Spleen: Normal in size without focal abnormality. Adrenals/Urinary Tract: The adrenal glands unremarkable. There is no hydronephrosis on either side. There is symmetric enhancement and  excretion of contrast by both kidneys. Bilateral renal cysts measure up to 2 cm on the right. The visualized ureters and urinary bladder appear unremarkable. Stomach/Bowel: There is sigmoid diverticulosis without active inflammatory changes. There is a large amount of stool throughout the colon. There is no bowel obstruction or active inflammation. Normal appendix. Vascular/Lymphatic: Moderate aortoiliac atherosclerotic disease. The IVC is unremarkable. No portal venous gas. There is no adenopathy. Reproductive: The prostate and seminal vesicles are grossly unremarkable. No pelvic mass. Other: None Musculoskeletal: Degenerative changes of the spine. No acute osseous pathology. Review of the MIP images confirms the above findings. IMPRESSION: 1. No CT evidence of pulmonary embolism. 2. Severe emphysema with bilateral lower lobe subpleural blebs. 3. No significant interval change in the rounded ground-glass opacity at the left lung base may represent scarring, atelectasis, or infiltrate. Continued follow-up recommended. 4. Small left pleural effusion. 5. Fatty liver. 6. Sigmoid diverticulosis. No bowel obstruction. Normal appendix. 7. Aortic Atherosclerosis (ICD10-I70.0) and Emphysema (ICD10-J43.9). Electronically Signed   By: Arash  Radparvar M.D.   On: 04/03/2020 20:14   DG Chest Port 1 View  Result Date: 04/03/2020 CLINICAL DATA:  Shortness of breath EXAM: PORTABLE CHEST 1 VIEW COMPARISON:  Chest radiograph March 30, 2020. FINDINGS: The heart size and mediastinal contours are within normal limits. Chronic bilateral emphysematous changes. Left basilar airspace opacity. No pleural effusion. No pneumothorax. The visualized skeletal structures are unchanged. IMPRESSION: 1. Left basilar airspace opacity suspicious for pneumonia. 2. Chronic bilateral emphysematous changes. Electronically Signed   By: Jeffrey  Waltz MD   On: 04/03/2020 18:42    EKG: Independently reviewed.  Sinus  tachycardia.  Assessment/Plan Principal Problem:   CAP (community acquired pneumonia) Active Problems:   HTN (hypertension)   DM2 (diabetes mellitus, type 2) (HCC)   HLA B27 (HLA B27 positive)   OSA (obstructive sleep apnea)   COPD (chronic obstructive pulmonary disease) (HCC)   Uveitis of both eyes    1. Possible community-acquired pneumonia for which patient has been placed on empiric antibiotics.  Covid test is negative.  Continue to monitor respiratory status. 2. Constipation with recent use of pain medication.  Soapsuds enema was ordered by the ER physician.  I have placed patient on senna and Dulcolax suppository.  If there is no much resolved and I have discussed with pharmacy about starting naloxegol. 3. COPD not actively wheezing we will keep patient on inhalers. 4. Diabetes mellitus type 2 we will   keep patient on sliding scale coverage. 5. History of vasculitis of the right lower extremity [facility].  Recently had some increased pain but presently patient complains more discomfort in the abdomen.  Will check sed rate and CRP to make sure there is no exacerbation.  We will continue patient's mycophenolate and methotrexate and etanercept.  Patient does have signs of infection but since patient has been on these medications chronically for uveitis and vasculitis hesitant to stop at this time given the patient's pneumonia is not overwhelming. 6. History of uveitis see #5.  On immunosuppressant. 7. Sleep apnea on CPAP at bedtime. 8. Hypertension on lisinopril and Cardizem. 9. Depression on Latuda Trileptal BuSpar Cymbalta.  Denies any suicidal ideation. 10. Chronic pain on gabapentin and takes tramadol. 11. Morbid obesity with BMI of 42.37.  Nutrition counseling.   DVT prophylaxis: Lovenox. Code Status: Full code. Family Communication: Discussed with patient. Disposition Plan: Home. Consults called: None. Admission status:  Observation.   Arshad N Kakrakandy MD [facility] Pager [phone].  If 7PM-7AM, please contact night-coverage [email or web address] Password [email or web address]  04/04/2020, 12:34 AM     

## 2020-04-04 NOTE — ED Notes (Signed)
Pt currently in restroom trying to have a BM after soap suds enema

## 2020-04-04 NOTE — ED Notes (Signed)
Date and time results received: 04/04/20 5:04 AM (use smartphrase ".now" to insert current time)  Test: Potassium ; Calcium Critical Value: 2.7; 5.6  Name of Provider Notified: Hal Hope, MD  Orders Received? Or Actions Taken?: Orders Received - See Orders for details

## 2020-04-05 DIAGNOSIS — I10 Essential (primary) hypertension: Secondary | ICD-10-CM

## 2020-04-05 DIAGNOSIS — J431 Panlobular emphysema: Secondary | ICD-10-CM | POA: Diagnosis not present

## 2020-04-05 DIAGNOSIS — J189 Pneumonia, unspecified organism: Secondary | ICD-10-CM | POA: Diagnosis not present

## 2020-04-05 DIAGNOSIS — J9601 Acute respiratory failure with hypoxia: Secondary | ICD-10-CM | POA: Diagnosis not present

## 2020-04-05 LAB — BASIC METABOLIC PANEL
Anion gap: 9 (ref 5–15)
BUN: 16 mg/dL (ref 8–23)
CO2: 28 mmol/L (ref 22–32)
Calcium: 8.2 mg/dL — ABNORMAL LOW (ref 8.9–10.3)
Chloride: 101 mmol/L (ref 98–111)
Creatinine, Ser: 1.1 mg/dL (ref 0.61–1.24)
GFR, Estimated: >60 mL/min (ref >60)
Glucose, Bld: 158 mg/dL — ABNORMAL HIGH (ref 70–99)
Potassium: 3.9 mmol/L (ref 3.5–5.1)
Sodium: 138 mmol/L (ref 135–145)

## 2020-04-05 LAB — GLUCOSE, CAPILLARY
Glucose-Capillary: 153 mg/dL — ABNORMAL HIGH (ref 70–99)
Glucose-Capillary: 163 mg/dL — ABNORMAL HIGH (ref 70–99)
Glucose-Capillary: 208 mg/dL — ABNORMAL HIGH (ref 70–99)
Glucose-Capillary: 136 mg/dL — ABNORMAL HIGH (ref 70–99)

## 2020-04-05 MED ORDER — TRAMADOL HCL 50 MG PO TABS
50.0000 mg | ORAL_TABLET | Freq: Four times a day (QID) | ORAL | Status: DC | PRN
  Administered 2020-04-05 – 2020-04-06 (×4): 50 mg via ORAL
  Filled 2020-04-05 (×4): qty 1

## 2020-04-05 MED ORDER — SORBITOL 70 % SOLN
960.0000 mL | TOPICAL_OIL | Freq: Once | ORAL | Status: AC
  Administered 2020-04-05: 960 mL via RECTAL
  Filled 2020-04-05: qty 473

## 2020-04-05 NOTE — Plan of Care (Signed)

## 2020-04-05 NOTE — Progress Notes (Addendum)
[facility] PROGRESS NOTE   Mario [name] ZOX:[id number] DOB: 11/04/1950 DOA: 04/03/2020  PCP: [facility]  Brief History/Interval Summary: 70 year old male with a past medical history of COPD, obstructive sleep apnea, uses oxygen mainly at nighttime, morbid obesity, essential hypertension, diabetes mellitus, chronic pain, depression who presented to the ED with the main complaints of constipation. However he was noted to be hypoxic. Evaluation raised concern for pneumonia. Patient was hospitalized for further management.  Reason for Visit: Acute respiratory failure with hypoxia.  Consultants: None  Procedures: None  Antibiotics: Anti-infectives (From admission, onward)   Start     Dose/Rate Route Frequency Ordered Stop   04/05/20 2000  cefTRIAXone (ROCEPHIN) 2 g in sodium chloride 0.9 % 100 mL IVPB        2 g 200 mL/hr over 30 Minutes Intravenous Every 24 hours 04/04/20 0033 04/10/20 1959   04/04/20 2200  azithromycin (ZITHROMAX) 500 mg in sodium chloride 0.9 % 250 mL IVPB        500 mg 250 mL/hr over 60 Minutes Intravenous Every 24 hours 04/04/20 0033 04/09/20 2159   04/03/20 2100  cefTRIAXone (ROCEPHIN) 1 g in sodium chloride 0.9 % 100 mL IVPB        1 g 200 mL/hr over 30 Minutes Intravenous  Once 04/03/20 2045 04/03/20 2205   04/03/20 2100  azithromycin (ZITHROMAX) tablet 500 mg        500 mg Oral  Once 04/03/20 2045 04/03/20 2131      Subjective/Interval History: Patient mentions that he is feeling slightly better today compared to yesterday. Still mainly concerned about his constipation. He mentions that he ran out of his laxatives for a week and could not refill them in time. Continues to have little bit more shortness of breath than his baseline. Denies any nausea or vomiting.    Assessment/Plan:  Community-acquired pneumonia/acute respiratory failure with hypoxia/history of COPD Patient uses oxygen only at nighttime. Noted to be hypoxic in the ED.  Was requiring 5 L of oxygen by nasal cannula. Continue to wean down. Imaging studies showed pneumonia. Covid test was negative. Patient remains on antibacterials. Mobilize.  Constipation Usually takes laxative last home. Out of his medication and could not get a refill on time. Was out of his medications for a week. Came in with significant constipation. He was given a soapsuds enema with some resolution but patient still feels full. Will be given additional enemas today. Continue Senokot and laxatives. Abdomen is benign on examination. He does not have any nausea or vomiting. CT abdomen pelvis did not show any acute findings. TSH was normal in October.  History of COPD Stable. No wheezing noted today. Noted to have some wheezing yesterday. Was started on steroids.  Diabetes mellitus type 2 Continue SSI. Five in November 2021.  History of vasculitis of right lower extremity Followed at the [place]. Patient on immunosuppressants. Patient noted to be on mycophenolate, methotrexate and etanercept. These medications are being continued.  History of uveitis Stable. As above.  History of sleep apnea Continue CPAP at bedtime.  Essential hypertension On lisinopril and Cardizem which is being continued.  History of depression Continue home medications.  Chronic pain syndrome Gabapentin and tramadol at home.  Morbid obesity Estimated body mass index is 42.34 kg/m as calculated from the following:   Height as of this encounter: 6\' 2"  (1.88 m).   Weight as of this encounter: 149.6 kg.   DVT Prophylaxis: On Lovenox Code Status: Full code Family Communication: Discussed  with the patient Disposition Plan: Hopefully return home when improved. Mobilize.  Status is: Inpatient  Remains inpatient appropriate because:IV treatments appropriate due to intensity of illness or inability to take PO and Inpatient level of care appropriate due to severity of illness   Dispo: The patient is from:  Home              Anticipated d/c is to: Home              Anticipated d/c date is: 2 days              Patient currently is not medically stable to d/c.   Difficult to place patient No     Medications:  Scheduled: . busPIRone  15 mg Oral Daily  . cyclobenzaprine  10 mg Oral QHS  . diltiazem  180 mg Oral Daily  . docusate sodium  100 mg Oral Daily  . DULoxetine  60 mg Oral BID WC  . enoxaparin (LOVENOX) injection  60 mg Subcutaneous Q24H  . [START ON 04/08/2020] etanercept  50 mg Subcutaneous Q Wed  . folic acid  1 mg Oral Daily  . gabapentin  900 mg Oral QHS  . insulin aspart  0-9 Units Subcutaneous TID WC  . lisinopril  20 mg Oral Daily  . lurasidone  40 mg Oral QPC supper  . melatonin  6 mg Oral QHS  . [START ON 04/11/2020] methotrexate  15 mg Oral Q Sat  . mycophenolate  1,500 mg Oral BID  . Oxcarbazepine  600 mg Oral TID  . polyethylene glycol  17 g Oral BID  . polyvinyl alcohol  1 drop Both Eyes Daily  . predniSONE  20 mg Oral Q breakfast  . senna-docusate  1 tablet Oral BID  . sorbitol, milk of mag, mineral oil, glycerin (SMOG) enema  960 mL Rectal Once  . tamsulosin  0.4 mg Oral Daily  . traZODone  75 mg Oral QHS  . umeclidinium-vilanterol  1 puff Inhalation Daily   Continuous: . azithromycin 500 mg (04/04/20 2301)  . cefTRIAXone (ROCEPHIN)  IV     QJJ:HERDEYCXKGYJE **OR** acetaminophen, nitroGLYCERIN   Objective:  Vital Signs  Vitals:   04/05/20 0306 04/05/20 0613 04/05/20 0751 04/05/20 0917  BP: (!) 106/52 128/83  134/78  Pulse: 81 76    Resp: (!) 21 20    Temp: 98 F (36.7 C) 98.1 F (36.7 C)    TempSrc:      SpO2: 91% 93% 96%   Weight:      Height:        Intake/Output Summary (Last 24 hours) at 04/05/2020 1111 Last data filed at 04/05/2020 0500 Gross per 24 hour  Intake 708.06 ml  Output --  Net 708.06 ml   Filed Weights   04/04/20 1748  Weight: (!) 149.6 kg    General appearance: Awake alert.  In no distress Resp: Mildly tachypneic at  rest. Diminished air entry at the bases with few crackles. No wheezing or rhonchi. Cardio: S1-S2 is normal regular.  No S3-S4.  No rubs murmurs or bruit GI: Abdomen is soft.  Nontender nondistended.  Bowel sounds are present normal.  No masses organomegaly Extremities: No edema.  Full range of motion of lower extremities. Neurologic: Alert and oriented x3.  No focal neurological deficits.    Lab Results:  Data Reviewed: I have personally reviewed following labs and imaging studies  CBC: Recent Labs  Lab 03/30/20 2034 04/03/20 1718 04/04/20 0337  WBC 13.2* 15.2*  13.5*  NEUTROABS  --  9.6*  --   HGB 15.2 16.9 14.3  HCT 46.5 50.1 43.7  MCV 100.4* 98.0 99.5  PLT 302 342 009    Basic Metabolic Panel: Recent Labs  Lab 03/30/20 2034 04/03/20 1718 04/04/20 0337 04/04/20 0516 04/05/20 0749  NA 138 139 134* 134* 138  K 4.3 4.5 2.7* 4.1 3.9  CL 103 100 112* 102 101  CO2 21* 24 17* 20* 28  GLUCOSE 116* 98 120* 155* 158*  BUN 19 18 13 15 16   CREATININE 1.08 1.22 0.70 0.87 1.10  CALCIUM 9.2 9.6 5.6* 8.5* 8.2*    GFR: Estimated Creatinine Clearance: 99.3 mL/min (by C-G formula based on SCr of 1.1 mg/dL).  Liver Function Tests: Recent Labs  Lab 03/30/20 2034 04/03/20 1718  AST 25 28  ALT 29 37  ALKPHOS 51 56  BILITOT 0.3 0.6  PROT 6.7 7.6  ALBUMIN 4.1 4.5     CBG: Recent Labs  Lab 04/04/20 0745 04/04/20 1204 04/04/20 1700 04/04/20 2052 04/05/20 0727  GLUCAP 198* 158* 148* 228* 153*     Recent Results (from the past 240 hour(s))  SARS Coronavirus 2 by RT PCR (hospital order, performed in [facility] hospital lab) Nasopharyngeal Nasopharyngeal Swab     Status: None   Collection Time: 03/30/20  8:35 PM   Specimen: Nasopharyngeal Swab  Result Value Ref Range Status   SARS Coronavirus 2 NEGATIVE NEGATIVE Final    Comment: (NOTE) SARS-CoV-2 target nucleic acids are NOT DETECTED.  The SARS-CoV-2 RNA is generally detectable in upper and lower respiratory  specimens during the acute phase of infection. The lowest concentration of SARS-CoV-2 viral copies this assay can detect is 250 copies / mL. A negative result does not preclude SARS-CoV-2 infection and should not be used as the sole basis for treatment or other patient management decisions.  A negative result may occur with improper specimen collection / handling, submission of specimen other than nasopharyngeal swab, presence of viral mutation(s) within the areas targeted by this assay, and inadequate number of viral copies (<250 copies / mL). A negative result must be combined with clinical observations, patient history, and epidemiological information.  Fact Sheet for Patients:   StrictlyIdeas.no  Fact Sheet for Healthcare Providers: BankingDealers.co.za  This test is not yet approved or  cleared by the [place] FDA and has been authorized for detection and/or diagnosis of SARS-CoV-2 by FDA under an Emergency Use Authorization (EUA).  This EUA will remain in effect (meaning this test can be used) for the duration of the COVID-19 declaration under Section 564(b)(1) of the Act, 21 U.S.C. section 360bbb-3(b)(1), unless the authorization is terminated or revoked sooner.  Performed at [facility], [place] [address], [place], [place] [address]   SARS Coronavirus 2 by RT PCR (hospital order, performed in [facility] hospital lab) Nasopharyngeal Nasopharyngeal Swab     Status: None   Collection Time: 04/03/20  9:40 PM   Specimen: Nasopharyngeal Swab  Result Value Ref Range Status   SARS Coronavirus 2 NEGATIVE NEGATIVE Final    Comment: (NOTE) SARS-CoV-2 target nucleic acids are NOT DETECTED.  The SARS-CoV-2 RNA is generally detectable in upper and lower respiratory specimens during the acute phase of infection. The lowest concentration of SARS-CoV-2 viral copies this assay can detect is 250 copies / mL. A negative  result does not preclude SARS-CoV-2 infection and should not be used as the sole basis for treatment or other patient management decisions.  A  negative result may occur with improper specimen collection / handling, submission of specimen other than nasopharyngeal swab, presence of viral mutation(s) within the areas targeted by this assay, and inadequate number of viral copies (<250 copies / mL). A negative result must be combined with clinical observations, patient history, and epidemiological information.  Fact Sheet for Patients:   StrictlyIdeas.no  Fact Sheet for Healthcare Providers: BankingDealers.co.za  This test is not yet approved or  cleared by the [place] FDA and has been authorized for detection and/or diagnosis of SARS-CoV-2 by FDA under an Emergency Use Authorization (EUA).  This EUA will remain in effect (meaning this test can be used) for the duration of the COVID-19 declaration under Section 564(b)(1) of the Act, 21 U.S.C. section 360bbb-3(b)(1), unless the authorization is terminated or revoked sooner.  Performed at [facility], [place] [address], [place], [place] [address]       Radiology Studies: CT Angio Chest PE W/Cm &/Or Wo Cm  Result Date: 04/03/2020 CLINICAL DATA:  70 year old male with concern for pulmonary embolism. EXAM: CT ANGIOGRAPHY CHEST CT ABDOMEN AND PELVIS WITH CONTRAST TECHNIQUE: Multidetector CT imaging of the chest was performed using the standard protocol during bolus administration of intravenous contrast. Multiplanar CT image reconstructions and MIPs were obtained to evaluate the vascular anatomy. Multidetector CT imaging of the abdomen and pelvis was performed using the standard protocol during bolus administration of intravenous contrast. CONTRAST:  150mL OMNIPAQUE IOHEXOL 350 MG/ML SOLN COMPARISON:  Chest CT dated 12/26/2019 and radiograph dated 04/03/2020. FINDINGS: CTA  CHEST FINDINGS Cardiovascular: There is no cardiomegaly or pericardial effusion. There is coronary vascular calcification. Moderate atherosclerotic calcification of the thoracic aorta. No aneurysmal dilatation. Evaluation of the pulmonary arteries is limited due to suboptimal opacification and timing of the contrast. No definite pulmonary artery embolus identified. Mediastinum/Nodes: No hilar or mediastinal adenopathy. The esophagus and the thyroid gland are grossly unremarkable. No mediastinal fluid collection. Lungs/Pleura: Shallow inspiration. There is severe emphysematous changes of the lungs with bilateral lower lobe subpleural blebs. There is a 3.2 x 2.6 cm focus of ground-glass opacity at the left lung base which may represent scarring, atelectasis, or infiltrate. This is a centrally similar to prior CT. Continued follow-up recommended. No new consolidation. Small left pleural effusion. No pneumothorax. The central airways are patent. Musculoskeletal: Osteopenia with degenerative changes of the spine. No acute osseous pathology. Review of the MIP images confirms the above findings. CT ABDOMEN and PELVIS FINDINGS No intra-abdominal free air or free fluid. Hepatobiliary: There is fatty infiltration of the liver. No intrahepatic biliary dilatation. There is a 2.5 cm cyst in the right lobe of the liver. The gallbladder is unremarkable. Pancreas: Unremarkable. No pancreatic ductal dilatation or surrounding inflammatory changes. Spleen: Normal in size without focal abnormality. Adrenals/Urinary Tract: The adrenal glands unremarkable. There is no hydronephrosis on either side. There is symmetric enhancement and excretion of contrast by both kidneys. Bilateral renal cysts measure up to 2 cm on the right. The visualized ureters and urinary bladder appear unremarkable. Stomach/Bowel: There is sigmoid diverticulosis without active inflammatory changes. There is a large amount of stool throughout the colon. There is no  bowel obstruction or active inflammation. Normal appendix. Vascular/Lymphatic: Moderate aortoiliac atherosclerotic disease. The IVC is unremarkable. No portal venous gas. There is no adenopathy. Reproductive: The prostate and seminal vesicles are grossly unremarkable. No pelvic mass. Other: None Musculoskeletal: Degenerative changes of the spine. No acute osseous pathology. Review of the MIP images confirms the above findings. IMPRESSION: 1.  No CT evidence of pulmonary embolism. 2. Severe emphysema with bilateral lower lobe subpleural blebs. 3. No significant interval change in the rounded ground-glass opacity at the left lung base may represent scarring, atelectasis, or infiltrate. Continued follow-up recommended. 4. Small left pleural effusion. 5. Fatty liver. 6. Sigmoid diverticulosis. No bowel obstruction. Normal appendix. 7. Aortic Atherosclerosis (ICD10-I70.0) and Emphysema (ICD10-J43.9). Electronically Signed   By: Anner Crete M.D.   On: 04/03/2020 20:14   CT Abdomen Pelvis W Contrast  Result Date: 04/03/2020 CLINICAL DATA:  70 year old male with concern for pulmonary embolism. EXAM: CT ANGIOGRAPHY CHEST CT ABDOMEN AND PELVIS WITH CONTRAST TECHNIQUE: Multidetector CT imaging of the chest was performed using the standard protocol during bolus administration of intravenous contrast. Multiplanar CT image reconstructions and MIPs were obtained to evaluate the vascular anatomy. Multidetector CT imaging of the abdomen and pelvis was performed using the standard protocol during bolus administration of intravenous contrast. CONTRAST:  132mL OMNIPAQUE IOHEXOL 350 MG/ML SOLN COMPARISON:  Chest CT dated 12/26/2019 and radiograph dated 04/03/2020. FINDINGS: CTA CHEST FINDINGS Cardiovascular: There is no cardiomegaly or pericardial effusion. There is coronary vascular calcification. Moderate atherosclerotic calcification of the thoracic aorta. No aneurysmal dilatation. Evaluation of the pulmonary arteries is  limited due to suboptimal opacification and timing of the contrast. No definite pulmonary artery embolus identified. Mediastinum/Nodes: No hilar or mediastinal adenopathy. The esophagus and the thyroid gland are grossly unremarkable. No mediastinal fluid collection. Lungs/Pleura: Shallow inspiration. There is severe emphysematous changes of the lungs with bilateral lower lobe subpleural blebs. There is a 3.2 x 2.6 cm focus of ground-glass opacity at the left lung base which may represent scarring, atelectasis, or infiltrate. This is a centrally similar to prior CT. Continued follow-up recommended. No new consolidation. Small left pleural effusion. No pneumothorax. The central airways are patent. Musculoskeletal: Osteopenia with degenerative changes of the spine. No acute osseous pathology. Review of the MIP images confirms the above findings. CT ABDOMEN and PELVIS FINDINGS No intra-abdominal free air or free fluid. Hepatobiliary: There is fatty infiltration of the liver. No intrahepatic biliary dilatation. There is a 2.5 cm cyst in the right lobe of the liver. The gallbladder is unremarkable. Pancreas: Unremarkable. No pancreatic ductal dilatation or surrounding inflammatory changes. Spleen: Normal in size without focal abnormality. Adrenals/Urinary Tract: The adrenal glands unremarkable. There is no hydronephrosis on either side. There is symmetric enhancement and excretion of contrast by both kidneys. Bilateral renal cysts measure up to 2 cm on the right. The visualized ureters and urinary bladder appear unremarkable. Stomach/Bowel: There is sigmoid diverticulosis without active inflammatory changes. There is a large amount of stool throughout the colon. There is no bowel obstruction or active inflammation. Normal appendix. Vascular/Lymphatic: Moderate aortoiliac atherosclerotic disease. The IVC is unremarkable. No portal venous gas. There is no adenopathy. Reproductive: The prostate and seminal vesicles are  grossly unremarkable. No pelvic mass. Other: None Musculoskeletal: Degenerative changes of the spine. No acute osseous pathology. Review of the MIP images confirms the above findings. IMPRESSION: 1. No CT evidence of pulmonary embolism. 2. Severe emphysema with bilateral lower lobe subpleural blebs. 3. No significant interval change in the rounded ground-glass opacity at the left lung base may represent scarring, atelectasis, or infiltrate. Continued follow-up recommended. 4. Small left pleural effusion. 5. Fatty liver. 6. Sigmoid diverticulosis. No bowel obstruction. Normal appendix. 7. Aortic Atherosclerosis (ICD10-I70.0) and Emphysema (ICD10-J43.9). Electronically Signed   By: Anner Crete M.D.   On: 04/03/2020 20:14   DG Chest [facility] 1 View  Result  Date: 04/03/2020 CLINICAL DATA:  Shortness of breath EXAM: PORTABLE CHEST 1 VIEW COMPARISON:  Chest radiograph March 30, 2020. FINDINGS: The heart size and mediastinal contours are within normal limits. Chronic bilateral emphysematous changes. Left basilar airspace opacity. No pleural effusion. No pneumothorax. The visualized skeletal structures are unchanged. IMPRESSION: 1. Left basilar airspace opacity suspicious for pneumonia. 2. Chronic bilateral emphysematous changes. Electronically Signed   By: Dahlia Bailiff MD   On: 04/03/2020 18:42       LOS: 1 day   Bonnielee Haff  [facility] Pager on [email or web address]  04/05/2020, 11:11 AM

## 2020-04-05 NOTE — Evaluation (Signed)
Physical Therapy Evaluation Patient Details Name: Mario [name] MRN: [id number] DOB: 1950-04-06 Today's Date: 04/05/2020   History of Present Illness  Pt admitted with dx of CAP and constipation x 8 days.  Pt with hx of MI, COPD, chronic back pain and obesity.  Clinical Impression  Pt admitted as above and presenting with functional mobility limitations 2* mild ambulatory balance deficits, chronic back pain exacerbated by increased mobility, and limited endurance.  Pt desat with ambulation on RA to 86% with marked increase in WOB.  O2 initiated at 2L and advanced to 3L to recover sats and maintain above 90% for ambulation.  Pt states he uses O2 with cpap at home.     Follow Up Recommendations No PT follow up    Equipment Recommendations  None recommended by PT    Recommendations for Other Services       Precautions / Restrictions Precautions Precautions: Fall Precaution Comments: monitor O2 sats Restrictions Weight Bearing Restrictions: No      Mobility  Bed Mobility Overal bed mobility: Modified Independent             General bed mobility comments: Pt unassisted to sit EOB    Transfers Overall transfer level: Modified independent Equipment used: None Transfers: Sit to/from Stand Sit to Stand: Modified independent (Device/Increase time)         General transfer comment: Pt unassisted to standing and with good balance noted  Ambulation/Gait Ambulation/Gait assistance: Min guard;Supervision Gait Distance (Feet): 300 Feet Assistive device: Straight cane Gait Pattern/deviations: Step-through pattern;Decreased step length - right;Decreased step length - left;Shuffle;Wide base of support Gait velocity: decr   General Gait Details: slightly widened BOS, several rest stops with SOB and pt desat to 86% on RA. O2 initiated at 2L and advanced to 3L to maintain sats above 90%  Stairs            Wheelchair Mobility    Modified Rankin (Stroke Patients Only)        Balance Overall balance assessment: Mild deficits observed, not formally tested                                           Pertinent Vitals/Pain Pain Assessment: Faces Faces Pain Scale: Hurts even more Pain Location: Back pain increasing with increased distance ambulated Pain Descriptors / Indicators: Grimacing;Guarding Pain Intervention(s): Limited activity within patient's tolerance;Monitored during session    Home Living Family/patient expects to be discharged to:: Private residence Living Arrangements: Alone Available Help at Discharge: Friend(s);Available PRN/intermittently Type of Home: Apartment Home Access: Stairs to enter Entrance Stairs-Rails: Right Entrance Stairs-Number of Steps: 4-5 ("they are nasty ones, they are out of code") Home Layout: One level Home Equipment: Walker - 2 wheels;Cane - single point      Prior Function Level of Independence: Independent with assistive device(s)         Comments: pt utilizies cane outside of apartment     Hand Dominance   Dominant Hand: Right    Extremity/Trunk Assessment   Upper Extremity Assessment Upper Extremity Assessment: Overall WFL for tasks assessed    Lower Extremity Assessment Lower Extremity Assessment: Overall WFL for tasks assessed    Cervical / Trunk Assessment Cervical / Trunk Assessment: Normal  Communication   Communication: No difficulties  Cognition Arousal/Alertness: Awake/alert Behavior During Therapy: WFL for tasks assessed/performed Overall Cognitive Status: Within Functional Limits for tasks assessed  General Comments      Exercises     Assessment/Plan    PT Assessment Patient needs continued PT services  PT Problem List Decreased activity tolerance;Decreased balance;Obesity;Pain;Cardiopulmonary status limiting activity       PT Treatment Interventions DME instruction;Gait training;Stair  training;Functional mobility training;Therapeutic activities;Therapeutic exercise;Balance training;Patient/family education    PT Goals (Current goals can be found in the Care Plan section)  Acute Rehab PT Goals Patient Stated Goal: HOME PT Goal Formulation: With patient Time For Goal Achievement: 04/19/20 Potential to Achieve Goals: Good    Frequency Min 3X/week   Barriers to discharge        Co-evaluation               AM-PAC PT "6 Clicks" Mobility  Outcome Measure Help needed turning from your back to your side while in a flat bed without using bedrails?: None Help needed moving from lying on your back to sitting on the side of a flat bed without using bedrails?: None Help needed moving to and from a bed to a chair (including a wheelchair)?: None Help needed standing up from a chair using your arms (e.g., wheelchair or bedside chair)?: A Little Help needed to walk in hospital room?: A Little Help needed climbing 3-5 steps with a railing? : A Little 6 Click Score: 21    End of Session Equipment Utilized During Treatment: Oxygen Activity Tolerance: Patient tolerated treatment well;Patient limited by fatigue;Patient limited by pain Patient left: Other (comment) (sitting EOB) Nurse Communication: Mobility status;Other (comment) (O2 sats with gait) PT Visit Diagnosis: Difficulty in walking, not elsewhere classified (R26.2)    Time: 2952-8413 PT Time Calculation (min) (ACUTE ONLY): 29 min   Charges:   PT Evaluation $PT Eval Low Complexity: 1 Low          [facility] Pager [phone] Office [phone]   Mario [name] 04/05/2020, 5:45 PM

## 2020-04-06 DIAGNOSIS — J9601 Acute respiratory failure with hypoxia: Secondary | ICD-10-CM | POA: Diagnosis not present

## 2020-04-06 DIAGNOSIS — K5900 Constipation, unspecified: Secondary | ICD-10-CM

## 2020-04-06 DIAGNOSIS — J431 Panlobular emphysema: Secondary | ICD-10-CM | POA: Diagnosis not present

## 2020-04-06 DIAGNOSIS — J189 Pneumonia, unspecified organism: Secondary | ICD-10-CM | POA: Diagnosis not present

## 2020-04-06 LAB — GLUCOSE, CAPILLARY
Glucose-Capillary: 141 mg/dL — ABNORMAL HIGH (ref 70–99)
Glucose-Capillary: 206 mg/dL — ABNORMAL HIGH (ref 70–99)
Glucose-Capillary: 214 mg/dL — ABNORMAL HIGH (ref 70–99)
Glucose-Capillary: 211 mg/dL — ABNORMAL HIGH (ref 70–99)

## 2020-04-06 MED ORDER — MAGNESIUM CITRATE PO SOLN
1.0000 | Freq: Once | ORAL | Status: AC
  Administered 2020-04-06: 1 via ORAL
  Filled 2020-04-06: qty 296

## 2020-04-06 MED ORDER — OXYCODONE HCL 5 MG PO TABS
5.0000 mg | ORAL_TABLET | Freq: Four times a day (QID) | ORAL | Status: DC | PRN
Start: 2020-04-06 — End: 2020-04-07
  Administered 2020-04-06 – 2020-04-07 (×2): 10 mg via ORAL
  Filled 2020-04-06: qty 1
  Filled 2020-04-06: qty 2
  Filled 2020-04-06: qty 1

## 2020-04-06 MED ORDER — CEFDINIR 300 MG PO CAPS
300.0000 mg | ORAL_CAPSULE | Freq: Two times a day (BID) | ORAL | Status: DC
  Administered 2020-04-06 – 2020-04-07 (×2): 300 mg via ORAL
  Filled 2020-04-06 (×2): qty 1

## 2020-04-06 MED ORDER — AZITHROMYCIN 250 MG PO TABS
500.0000 mg | ORAL_TABLET | Freq: Every day | ORAL | Status: DC
  Administered 2020-04-06 – 2020-04-07 (×2): 500 mg via ORAL
  Filled 2020-04-06 (×2): qty 2

## 2020-04-06 NOTE — TOC Initial Note (Addendum)
Transition of Care [facility]) - Initial/Assessment Note    Patient Details  Name: Mario [name] MRN: [id number] Date of Birth: 1950/08/24  Transition of Care [facility]) CM/SW Contact:    Dessa Phi, RN Phone Number: 04/06/2020, 2:20 PM  Clinical Narrative:Spoke to patient about d/c plans-d/c home-home 02-[facility] Sheela Stack csw Brianna [phone] vm for home 02-delivery to hospital @ d/c-patient wants to d/c home on own by uber. Will await call back from [place]. Informed our admitting dept of 72hr tel# to call the [place] for admission-1 [phone].    3:13p-faxed 02 sats fro PT note to [email or web address]  3:31p-spoke to [place] rep-[place]-will need documented ambulatory 02 sats prior emailing for home 02 to be delivered to hospital prior [email or web address].               Expected Discharge Plan: Home/Self Care Barriers to Discharge: Continued Medical Work up   Patient Goals and CMS Choice Patient states their goals for this hospitalization and ongoing recovery are:: go home CMS Medicare.gov Compare Post Acute Care list provided to:: Patient Choice offered to / list presented to : Patient  Expected Discharge Plan and Services Expected Discharge Plan: Home/Self Care   Discharge Planning Services: CM Consult   Living arrangements for the past 2 months: Apartment                                      Prior Living Arrangements/Services Living arrangements for the past 2 months: Apartment Lives with:: Roommate Patient language and need for interpreter reviewed:: Yes Do you feel safe going back to the place where you live?: Yes      Need for Family Participation in Patient Care: No (Comment) Care giver support system in place?: Yes (comment) Current home services: DME (cane, rw) Criminal Activity/Legal Involvement Pertinent to Current Situation/Hospitalization: No - Comment as needed  Activities of  Daily Living Home Assistive Devices/Equipment: CPAP,Oxygen,Cane (specify quad or straight),Eyeglasses ADL Screening (condition at time of admission) Patient's cognitive ability adequate to safely complete daily activities?: Yes Is the patient deaf or have difficulty hearing?: No Does the patient have difficulty seeing, even when wearing glasses/contacts?: No Does the patient have difficulty concentrating, remembering, or making decisions?: No Patient able to express need for assistance with ADLs?: Yes Does the patient have difficulty dressing or bathing?: No Independently performs ADLs?: Yes (appropriate for developmental age) Does the patient have difficulty walking or climbing stairs?: Yes (gets sob) Weakness of Legs: Both Weakness of Arms/Hands: Both  Permission Sought/Granted Permission sought to share information with : Case Manager Permission granted to share information with : Yes, Verbal Permission Granted  Share Information with NAME: Case manager           Emotional Assessment Appearance:: Appears stated age Attitude/Demeanor/Rapport: Gracious Affect (typically observed): Accepting Orientation: : Oriented to Self,Oriented to Place,Oriented to  Time,Oriented to Situation Alcohol / Substance Use: Not Applicable Psych Involvement: No (comment)  Admission diagnosis:  Drug-induced constipation [K59.03] CAP (community acquired pneumonia) [J18.9] Community acquired pneumonia of left lower lobe of lung [J18.9] Patient Active Problem List   Diagnosis Date Noted  . Drug-induced constipation   . CAP (community acquired pneumonia) 04/03/2020  . Acute exacerbation of chronic obstructive pulmonary disease (COPD) ([place]) 01/05/2020  . Vertigo 12/26/2019  . 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 Stage 3a chronic kidney disease ([place]) 12/26/2019  . Abnormal  CT of the chest 12/26/2019  . COPD (chronic obstructive pulmonary disease) ([place])   . AKI (acute kidney injury) ([place])   .  Hyperglycemia 06/23/2019  . OSA (obstructive sleep apnea) 06/23/2019  . Suicidal ideation 06/23/2019  . Major depressive disorder, recurrent episode ([place]) 10/12/2018  . 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 Cellulitis of both feet 07/16/2017  . DM2 (diabetes mellitus, type 2) ([place]) 07/16/2017  . HLA B27 (HLA B27 positive) 07/16/2017  . Acute chest pain   . Hyperkalemia   . Spontaneous pneumothorax 06/27/2016  . Hypoxia   . Pneumothorax on left   . Dyspnea 05/23/2014  . COPD exacerbation ([place]) 05/23/2014  . Malingering 01/21/2013  . Depression 01/11/2013  . Tobacco abuse 01/11/2013  . Obesity, unspecified 01/11/2013  . Chest pain 01/10/2013  . HTN (hypertension) 01/10/2013   PCP:  Clinic, [place]:   [place], [place] - [facility] [address] [place] [address] Phone: [phone] Fax: [phone]     Social Determinants of Health (SDOH) Interventions    Readmission Risk Interventions No flowsheet data found.

## 2020-04-06 NOTE — Progress Notes (Signed)
SATURATION QUALIFICATIONS:   Patient Saturations on Room Air at Rest = 90 %  Patient Saturations on Room Air while Ambulating = 86 %  Patient Saturations on 2 Liters of oxygen while Ambulating = 94-96%  Please briefly explain why patient needs home oxygen: Activity overall tolerated well, pt reported SOB after ambulating 48 feet. O2 saturation decreased to 86% at this time. Oxygen 2 lt [place] applied and saturation increased 96%.

## 2020-04-06 NOTE — Progress Notes (Signed)
[facility] PROGRESS NOTE   Mario [name] QQP:[id number] DOB: 02-20-1951 DOA: 04/03/2020  PCP: [facility]  Brief History/Interval Summary: 70 year old male with a past medical history of COPD, obstructive sleep apnea, uses oxygen mainly at nighttime, morbid obesity, essential hypertension, diabetes mellitus, chronic pain, depression who presented to the ED with the main complaints of constipation. However he was noted to be hypoxic. Evaluation raised concern for pneumonia. Patient was hospitalized for further management.  Reason for Visit: Acute respiratory failure with hypoxia.  Consultants: None  Procedures: None  Antibiotics: Anti-infectives (From admission, onward)   Start     Dose/Rate Route Frequency Ordered Stop   04/05/20 2000  cefTRIAXone (ROCEPHIN) 2 g in sodium chloride 0.9 % 100 mL IVPB        2 g 200 mL/hr over 30 Minutes Intravenous Every 24 hours 04/04/20 0033 04/10/20 1959   04/04/20 2200  azithromycin (ZITHROMAX) 500 mg in sodium chloride 0.9 % 250 mL IVPB        500 mg 250 mL/hr over 60 Minutes Intravenous Every 24 hours 04/04/20 0033 04/09/20 2159   04/03/20 2100  cefTRIAXone (ROCEPHIN) 1 g in sodium chloride 0.9 % 100 mL IVPB        1 g 200 mL/hr over 30 Minutes Intravenous  Once 04/03/20 2045 04/03/20 2205   04/03/20 2100  azithromycin (ZITHROMAX) tablet 500 mg        500 mg Oral  Once 04/03/20 2045 04/03/20 2131      Subjective/Interval History: Patient mentions that from a respiratory standpoint he is feeling better.  Not as short of breath as before.  However he is still concerned that he has not had a bowel movement.  Passing gas from below.  Had a small BM yesterday after smog enema.      Assessment/Plan:  Community-acquired pneumonia/acute respiratory failure with hypoxia/history of COPD Patient uses oxygen only at nighttime. Noted to be hypoxic in the ED. Was requiring 5 L of oxygen by nasal cannula. Has been weaned down to 3  L.   Continue antibiotics.  Changed to oral today.   Mobilize.  Constipation Usually takes laxative last home. He ran out of his medication and could not get a refill on time. Was out of his medications for a week. Came in with significant constipation. He was given a soapsuds enema with some resolution but patient still feels full.  Given a smog enema yesterday without any relief.  Abdomen remains benign.  Good bowel sounds are present.  We will give him mag citrate.   CT abdomen pelvis did not show any acute findings. TSH was normal in October.  History of COPD with mild acute exacerbation Started on steroids.  Seems to be better.  Not much wheezing heard today.    Diabetes mellitus type 2 Continue SSI.   History of vasculitis of right lower extremity Followed at the [place]. Patient on immunosuppressants. Patient noted to be on mycophenolate, methotrexate and etanercept. These medications are being continued.  History of uveitis Stable. As above.  History of sleep apnea Continue CPAP at bedtime.  Essential hypertension On lisinopril and Cardizem which is being continued.  History of depression Continue home medications.  Chronic pain syndrome Gabapentin and tramadol at home.  Morbid obesity Estimated body mass index is 42.34 kg/m as calculated from the following:   Height as of this encounter: 6\' 2"  (1.88 m).   Weight as of this encounter: 149.6 kg.   DVT Prophylaxis: On Lovenox Code  Status: Full code Family Communication: Discussed with the patient Disposition Plan: Hopefully return home when improved.  Seen by PT  Status is: Inpatient  Remains inpatient appropriate because:IV treatments appropriate due to intensity of illness or inability to take PO and Inpatient level of care appropriate due to severity of illness   Dispo: The patient is from: Home              Anticipated d/c is to: Home              Anticipated d/c date is: 2 days              Patient currently  is not medically stable to d/c.   Difficult to place patient No     Medications:  Scheduled: . busPIRone  15 mg Oral Daily  . cyclobenzaprine  10 mg Oral QHS  . diltiazem  180 mg Oral Daily  . docusate sodium  100 mg Oral Daily  . DULoxetine  60 mg Oral BID WC  . enoxaparin (LOVENOX) injection  60 mg Subcutaneous Q24H  . [START ON 04/08/2020] etanercept  50 mg Subcutaneous Q Wed  . folic acid  1 mg Oral Daily  . gabapentin  900 mg Oral QHS  . insulin aspart  0-9 Units Subcutaneous TID WC  . lisinopril  20 mg Oral Daily  . lurasidone  40 mg Oral QPC supper  . melatonin  6 mg Oral QHS  . [START ON 04/11/2020] methotrexate  15 mg Oral Q Sat  . mycophenolate  1,500 mg Oral BID  . Oxcarbazepine  600 mg Oral TID  . polyethylene glycol  17 g Oral BID  . polyvinyl alcohol  1 drop Both Eyes Daily  . senna-docusate  1 tablet Oral BID  . tamsulosin  0.4 mg Oral Daily  . traZODone  75 mg Oral QHS  . umeclidinium-vilanterol  1 puff Inhalation Daily   Continuous: . azithromycin 500 mg (04/05/20 2210)  . cefTRIAXone (ROCEPHIN)  IV 2 g (04/05/20 2100)   XHB:ZJIRCVELFYBOF **OR** acetaminophen, nitroGLYCERIN, traMADol   Objective:  Vital Signs  Vitals:   04/05/20 2043 04/06/20 0555 04/06/20 0811 04/06/20 0955  BP: (!) 143/86 110/60  (!) 141/85  Pulse: 97 70  94  Resp: 20 20  18   Temp: 97.9 F (36.6 C) 98.1 F (36.7 C)    TempSrc: Oral     SpO2: 92% 95% 94% 98%  Weight:      Height:        Intake/Output Summary (Last 24 hours) at 04/06/2020 1123 Last data filed at 04/05/2020 2210 Gross per 24 hour  Intake 480 ml  Output 400 ml  Net 80 ml   Filed Weights   04/04/20 1748  Weight: (!) 149.6 kg    General appearance: Awake alert.  In no distress Resp: Clear to auscultation bilaterally.  Normal effort Cardio: S1-S2 is normal regular.  No S3-S4.  No rubs murmurs or bruit GI: Abdomen is soft.  Nontender nondistended.  Bowel sounds are present normal.  No masses  organomegaly Extremities: No edema.  Neurologic: Alert and oriented x3.  No focal neurological deficits.      Lab Results:  Data Reviewed: I have personally reviewed following labs and imaging studies  CBC: Recent Labs  Lab 03/30/20 2034 04/03/20 1718 04/04/20 0337  WBC 13.2* 15.2* 13.5*  NEUTROABS  --  9.6*  --   HGB 15.2 16.9 14.3  HCT 46.5 50.1 43.7  MCV 100.4* 98.0 99.5  PLT  302 342 650    Basic Metabolic Panel: Recent Labs  Lab 03/30/20 2034 04/03/20 1718 04/04/20 0337 04/04/20 0516 04/05/20 0749  NA 138 139 134* 134* 138  K 4.3 4.5 2.7* 4.1 3.9  CL 103 100 112* 102 101  CO2 21* 24 17* 20* 28  GLUCOSE 116* 98 120* 155* 158*  BUN 19 18 13 15 16   CREATININE 1.08 1.22 0.70 0.87 1.10  CALCIUM 9.2 9.6 5.6* 8.5* 8.2*    GFR: Estimated Creatinine Clearance: 99.3 mL/min (by C-G formula based on SCr of 1.1 mg/dL).  Liver Function Tests: Recent Labs  Lab 03/30/20 2034 04/03/20 1718  AST 25 28  ALT 29 37  ALKPHOS 51 56  BILITOT 0.3 0.6  PROT 6.7 7.6  ALBUMIN 4.1 4.5     CBG: Recent Labs  Lab 04/05/20 0727 04/05/20 1140 04/05/20 1655 04/05/20 2041 04/06/20 0747  GLUCAP 153* 163* 208* 136* 141*     Recent Results (from the past 240 hour(s))  SARS Coronavirus 2 by RT PCR (hospital order, performed in [facility] hospital lab) Nasopharyngeal Nasopharyngeal Swab     Status: None   Collection Time: 03/30/20  8:35 PM   Specimen: Nasopharyngeal Swab  Result Value Ref Range Status   SARS Coronavirus 2 NEGATIVE NEGATIVE Final    Comment: (NOTE) SARS-CoV-2 target nucleic acids are NOT DETECTED.  The SARS-CoV-2 RNA is generally detectable in upper and lower respiratory specimens during the acute phase of infection. The lowest concentration of SARS-CoV-2 viral copies this assay can detect is 250 copies / mL. A negative result does not preclude SARS-CoV-2 infection and should not be used as the sole basis for treatment or other patient management  decisions.  A negative result may occur with improper specimen collection / handling, submission of specimen other than nasopharyngeal swab, presence of viral mutation(s) within the areas targeted by this assay, and inadequate number of viral copies (<250 copies / mL). A negative result must be combined with clinical observations, patient history, and epidemiological information.  Fact Sheet for Patients:   StrictlyIdeas.no  Fact Sheet for Healthcare Providers: BankingDealers.co.za  This test is not yet approved or  cleared by the [place] FDA and has been authorized for detection and/or diagnosis of SARS-CoV-2 by FDA under an Emergency Use Authorization (EUA).  This EUA will remain in effect (meaning this test can be used) for the duration of the COVID-19 declaration under Section 564(b)(1) of the Act, 21 U.S.C. section 360bbb-3(b)(1), unless the authorization is terminated or revoked sooner.  Performed at [facility], [place] [address] [place], [place] [address]   SARS Coronavirus 2 by RT PCR (hospital order, performed in [facility] hospital lab) Nasopharyngeal Nasopharyngeal Swab     Status: None   Collection Time: 04/03/20  9:40 PM   Specimen: Nasopharyngeal Swab  Result Value Ref Range Status   SARS Coronavirus 2 NEGATIVE NEGATIVE Final    Comment: (NOTE) SARS-CoV-2 target nucleic acids are NOT DETECTED.  The SARS-CoV-2 RNA is generally detectable in upper and lower respiratory specimens during the acute phase of infection. The lowest concentration of SARS-CoV-2 viral copies this assay can detect is 250 copies / mL. A negative result does not preclude SARS-CoV-2 infection and should not be used as the sole basis for treatment or other patient management decisions.  A negative result may occur with improper specimen collection / handling, submission of specimen other than nasopharyngeal swab, presence of  viral mutation(s) within the areas targeted by this  assay, and inadequate number of viral copies (<250 copies / mL). A negative result must be combined with clinical observations, patient history, and epidemiological information.  Fact Sheet for Patients:   StrictlyIdeas.no  Fact Sheet for Healthcare Providers: BankingDealers.co.za  This test is not yet approved or  cleared by the [place] FDA and has been authorized for detection and/or diagnosis of SARS-CoV-2 by FDA under an Emergency Use Authorization (EUA).  This EUA will remain in effect (meaning this test can be used) for the duration of the COVID-19 declaration under Section 564(b)(1) of the Act, 21 U.S.C. section 360bbb-3(b)(1), unless the authorization is terminated or revoked sooner.  Performed at [facility], [place] [address], [place], [place] [address]       Radiology Studies: No results found.     LOS: 2 days   [facility] Pager on [email or web address]  04/06/2020, 11:23 AM

## 2020-04-06 NOTE — Progress Notes (Signed)
Nutrition Brief Note  Patient identified on the Malnutrition Screening Tool (MST) Report (score: 2.0).  Wt Readings from Last 15 Encounters:  04/04/20 (!) 149.6 kg  03/30/20 (!) 149.7 kg  01/03/20 (!) 154.2 kg  07/06/19 (!) 150 kg  07/03/19 (!) 150 kg  06/22/19 (!) 149.7 kg  10/10/18 (!) 145.2 kg  04/30/18 (!) 149.7 kg  02/20/18 (!) 149.7 kg  11/10/17 (!) 151 kg  07/17/17 (!) 146 kg  09/17/16 (!) 141.5 kg  07/07/16 (!) 149.7 kg  10/10/15 (!) 143.4 kg  05/26/14 (!) 142.1 kg    Body mass index is 42.34 kg/m. Patient meets criteria for morbid obesity based on current BMI. Weight on 04/04/20 was 329 lb and weight on 01/03/20, which was an outlier of weights since 2016, was 339 lb. This indicates 10 lb weight loss (3% body weight) which is not significant for 3 month time frame. Skin WDL.  Current diet order is Heart Healthy Carb Modified and he is eating 95-100% at meals at this time. Labs and medications reviewed.   No nutrition interventions warranted at this time. If nutrition issues arise, please consult RD.      Jarome Matin, MS, RD, LDN, CNSC Inpatient Clinical Dietitian RD pager # available in [place]  After hours/weekend pager # available in [facility]

## 2020-04-06 NOTE — Evaluation (Signed)
Occupational Therapy Evaluation Patient Details Name: Mario [name] MRN: [id number] DOB: Jan 22, 1951 Today's Date: 04/06/2020    History of Present Illness Pt admitted with dx of CAP and constipation x 8 days.  Pt with hx of MI, COPD, chronic back pain and obesity.   Clinical Impression   Patient lives in apartment alone and is mod I with self care, functional transfers. Pt reports main complaint at home is dyspnea with showering. Patient already has shower chair, Met[place] and long handle sponge. Recommended to patient to set up one of his small O2 tanks and set on chair outside the shower for supplemental oxygen it states "I'll think about it." Pt desat to 87-88% after ~68ft of ambulation, with 2L O2 donned and seated rest pt quickly recovered to 93%. Patient did not require any physical assistance for ADLs or functional transfers, no further acute OT needs at this time will sign off. Please re-consult if new needs arise.   Follow Up Recommendations  No OT follow up    Equipment Recommendations  None recommended by OT       Precautions / Restrictions Precautions Precaution Comments: monitor O2 sats Restrictions Weight Bearing Restrictions: No      Mobility Bed Mobility Overal bed mobility: Modified Independent                  Transfers Overall transfer level: Modified independent Equipment used: None Transfers: Sit to/from Stand Sit to Stand: Modified independent (Device/Increase time)              Balance Overall balance assessment: Mild deficits observed, not formally tested                                         ADL either performed or assessed with clinical judgement   ADL Overall ADL's : At baseline                                       General ADL Comments: patient able to ambulate to bathroom to perform toileting tasks, doff/don socks and perform functional transfers without assistance. patient does desat to 87-88% on RA  after ~58ft ambulation, donned 2L O2 and patient recover quickly to 93%. Educated patient on use of O2 during showers at home as patient states he gets very out of breath with this ADL. Pt already has DME to maximize energy conservation while bathing.                  Pertinent Vitals/Pain Pain Assessment: 0-10 Pain Score: 6  Pain Location: back Pain Descriptors / Indicators: Aching Pain Intervention(s): RN gave pain meds during session     Hand Dominance Right   Extremity/Trunk Assessment Upper Extremity Assessment Upper Extremity Assessment: Overall WFL for tasks assessed   Lower Extremity Assessment Lower Extremity Assessment: Defer to PT evaluation   Cervical / Trunk Assessment Cervical / Trunk Assessment: Normal   Communication Communication Communication: No difficulties   Cognition Arousal/Alertness: Awake/alert Behavior During Therapy: WFL for tasks assessed/performed Overall Cognitive Status: Within Functional Limits for tasks assessed  Home Living Family/patient expects to be discharged to:: Private residence Living Arrangements: Alone Available Help at Discharge: Friend(s);Available PRN/intermittently Type of Home: Apartment Home Access: Stairs to enter [employer] of Steps: 4-5 ("they are nasty ones, they are out of code") Entrance Stairs-Rails: Right Home Layout: One level     Bathroom Shower/Tub: Teacher, early years/pre: Standard     Home Equipment: Environmental consultant - 2 wheels;Cane - single point;Shower seat;Hand held shower head;Adaptive equipment Adaptive Equipment: Long-handled sponge        Prior Functioning/Environment Level of Independence: Independent with assistive device(s)        Comments: pt utilizies cane outside of apartment        OT Problem List: Cardiopulmonary status limiting activity         OT Goals(Current goals can be found in the care  plan section) Acute Rehab OT Goals Patient Stated Goal: HOME OT Goal Formulation: All assessment and education complete, DC therapy   AM-PAC OT "6 Clicks" Daily Activity     Outcome Measure Help from another person eating meals?: None Help from another person taking care of personal grooming?: None Help from another person toileting, which includes using toliet, bedpan, or urinal?: None Help from another person bathing (including washing, rinsing, drying)?: None Help from another person to put on and taking off regular upper body clothing?: None Help from another person to put on and taking off regular lower body clothing?: None 6 Click Score: 24   End of Session Equipment Utilized During Treatment: Oxygen;Other (comment) (cane) Nurse Communication: Mobility status  Activity Tolerance: Patient tolerated treatment well Patient left: Other (comment);with call bell/phone within reach (seated EOB)  OT Visit Diagnosis: Other abnormalities of gait and mobility (R26.89)                Time: 0940-7680 OT Time Calculation (min): 34 min Charges:  OT General Charges $OT Visit: 1 Visit OT Evaluation $OT Eval Low Complexity: 1 Low OT Treatments $Self Care/Home Management : 8-22 mins  Delbert Phenix OT OT pager: [place] 04/06/2020, 12:48 PM

## 2020-04-07 DIAGNOSIS — J189 Pneumonia, unspecified organism: Secondary | ICD-10-CM | POA: Diagnosis not present

## 2020-04-07 LAB — CBC
HCT: 41.5 % (ref 39.0–52.0)
Hemoglobin: 13.6 g/dL (ref 13.0–17.0)
MCH: 32.8 pg (ref 26.0–34.0)
MCHC: 32.8 g/dL (ref 30.0–36.0)
MCV: 100 fL (ref 80.0–100.0)
Platelets: 227 10*3/uL (ref 150–400)
RBC: 4.15 MIL/uL — ABNORMAL LOW (ref 4.22–5.81)
RDW: 13.1 % (ref 11.5–15.5)
WBC: 10.8 10*3/uL — ABNORMAL HIGH (ref 4.0–10.5)
nRBC: 0 % (ref 0.0–0.2)

## 2020-04-07 LAB — GLUCOSE, CAPILLARY
Glucose-Capillary: 154 mg/dL — ABNORMAL HIGH (ref 70–99)
Glucose-Capillary: 171 mg/dL — ABNORMAL HIGH (ref 70–99)
Glucose-Capillary: 159 mg/dL — ABNORMAL HIGH (ref 70–99)

## 2020-04-07 LAB — BASIC METABOLIC PANEL
Anion gap: 10 (ref 5–15)
BUN: 11 mg/dL (ref 8–23)
CO2: 24 mmol/L (ref 22–32)
Calcium: 8.5 mg/dL — ABNORMAL LOW (ref 8.9–10.3)
Chloride: 97 mmol/L — ABNORMAL LOW (ref 98–111)
Creatinine, Ser: 0.9 mg/dL (ref 0.61–1.24)
GFR, Estimated: >60 mL/min (ref >60)
Glucose, Bld: 141 mg/dL — ABNORMAL HIGH (ref 70–99)
Potassium: 3.9 mmol/L (ref 3.5–5.1)
Sodium: 131 mmol/L — ABNORMAL LOW (ref 135–145)

## 2020-04-07 MED ORDER — SENNOSIDES-DOCUSATE SODIUM 8.6-50 MG PO TABS: 1.0000 | ORAL_TABLET | Freq: Two times a day (BID) | ORAL | 0 refills | Status: DC

## 2020-04-07 MED ORDER — CEFDINIR 300 MG PO CAPS: 300.0000 mg | ORAL_CAPSULE | Freq: Two times a day (BID) | ORAL | 0 refills | Status: AC

## 2020-04-07 MED ORDER — AZITHROMYCIN 500 MG PO TABS: 500.0000 mg | ORAL_TABLET | Freq: Every day | ORAL | 0 refills | Status: AC

## 2020-04-07 NOTE — Progress Notes (Signed)
Reviewed discharge instructions with patient. Patient verbalized understanding of instructions and medications.  Doak Mah, Laurel Dimmer, RN

## 2020-04-07 NOTE — Plan of Care (Signed)
  Problem: Education: Goal: Knowledge of General Education information will improve Description: Including pain rating scale, medication(s)/side effects and non-pharmacologic comfort measures Outcome: Completed/Met   Problem: Activity: Goal: Risk for activity intolerance will decrease Outcome: Completed/Met   Problem: Coping: Goal: Level of anxiety will decrease Outcome: Completed/Met   Problem: Pain Managment: Goal: General experience of comfort will improve Outcome: Completed/Met

## 2020-04-07 NOTE — TOC Transition Note (Addendum)
Transition of Care [facility]) - CM/SW Discharge Note   Patient Details  Name: Mario [name] MRN: [id number] Date of Birth: Aug 11, 1950  Transition of Care [facility]) CM/SW Contact:  Dessa Phi, RN Phone Number: 04/07/2020, 11:58 AM   Clinical Narrative: Faxed w/confirmation to Sharlene Motts rep Darlina Guys- H&P,home 02 order-await delivery of home 02 travel tank to patient's rm prior d/c today. Nsg managing arrangements for transportation. No further CM needs.  1:21p-TC [place] home 02-the time frame for delivery is a 4hr timeframe.     Final next level of care: Home/Self Care Barriers to Discharge: No Barriers Identified   Patient Goals and CMS Choice Patient states their goals for this hospitalization and ongoing recovery are:: go home CMS Medicare.gov Compare Post Acute Care list provided to:: Patient Choice offered to / list presented to : Patient  Discharge Placement                       Discharge Plan and Services   Discharge Planning Services: CM Consult            DME Arranged: Oxygen DME Agency:  [facility]) Date DME Agency Contacted: 04/07/20 Time DME Agency Contacted: [phone] Representative spoke with at DME Agency: [place] ([place]) Interventions     Readmission Risk Interventions No flowsheet data found.

## 2020-04-07 NOTE — Discharge Summary (Signed)
[facility]  Physician Discharge Summary   Patient ID: Mario [name] MRN: [id number] DOB/AGE: 1951-02-05 70 y.o.  Admit date: 04/03/2020 Discharge date: 04/07/2020  PCP: [facility]  DISCHARGE DIAGNOSES:  Community-acquired pneumonia Acute on chronic respiratory failure with hypoxia Constipation History of COPD with mild acute exacerbation Diabetes mellitus type 2 History of vasculitis History of uveitis Essential hypertension Obstructive sleep apnea History of depression Chronic pain syndrome Morbid obesity  RECOMMENDATIONS FOR OUTPATIENT FOLLOW UP: 1. Patient to follow-up with PCP in 1 to 2 weeks    Home Health: None Equipment/Devices: Home oxygen  CODE STATUS: Full code  DISCHARGE CONDITION: fair  Diet recommendation: Modified carbohydrate  INITIAL HISTORY: 70 year old male with a past medical history of COPD, obstructive sleep apnea, uses oxygen mainly at nighttime, morbid obesity, essential hypertension, diabetes mellitus, chronic pain, depression who presented to the ED with the main complaints of constipation. However he was noted to be hypoxic. Evaluation raised concern for pneumonia. Patient was hospitalized for further management.   HOSPITAL COURSE:   Community-acquired pneumonia/acute on chronic respiratory failure with hypoxia Patient uses oxygen only at nighttime. Noted to be hypoxic in the ED. Was requiring 5 L of oxygen by nasal cannula. Has been weaned down to 2 L.   Antibiotics were changed to oral.  Patient was mobilized.  Home oxygen will be ordered for around-the-clock use.   Constipation Usually takes laxative last home. He ran out of his medication and could not get a refill on time. Was out of his medications for a week.  Plus he was requiring narcotics for worsening back pain over the last couple of weeks.  Likely medication induced constipation.  Was given mag citrate with resolution.   CT abdomen pelvis did not show any  acute findings. TSH was normal in October.  History of COPD with mild acute exacerbation Given a course of steroids.  Seems to be better.    Diabetes mellitus type 2    History of vasculitis of right lower extremity Followed at the [place]. Patient on immunosuppressants. Patient noted to be on mycophenolate, methotrexate and etanercept.   History of uveitis Stable.  History of sleep apnea Continue CPAP at bedtime.  Essential hypertension On lisinopril and Cardizem which is being continued.  History of depression Continue home medications.  Chronic pain syndrome Gabapentin and tramadol at home.  Morbid obesity Estimated body mass index is 42.34 kg/m as calculated from the following:   Height as of this encounter: 6\' 2"  (1.88 m).   Weight as of this encounter: 149.6 kg.   Overall stable.  Okay for discharge home today.   PERTINENT LABS:  The results of significant diagnostics from this hospitalization (including imaging, microbiology, ancillary and laboratory) are listed below for reference.    Microbiology: Recent Results (from the past 240 hour(s))  SARS Coronavirus 2 by RT PCR (hospital order, performed in [facility] hospital lab) Nasopharyngeal Nasopharyngeal Swab     Status: None   Collection Time: 03/30/20  8:35 PM   Specimen: Nasopharyngeal Swab  Result Value Ref Range Status   SARS Coronavirus 2 NEGATIVE NEGATIVE Final    Comment: (NOTE) SARS-CoV-2 target nucleic acids are NOT DETECTED.  The SARS-CoV-2 RNA is generally detectable in upper and lower respiratory specimens during the acute phase of infection. The lowest concentration of SARS-CoV-2 viral copies this assay can detect is 250 copies / mL. A negative result does not preclude SARS-CoV-2 infection and should not be used as the sole basis for treatment  or other patient management decisions.  A negative result may occur with improper specimen collection / handling, submission of specimen  other than nasopharyngeal swab, presence of viral mutation(s) within the areas targeted by this assay, and inadequate number of viral copies (<250 copies / mL). A negative result must be combined with clinical observations, patient history, and epidemiological information.  Fact Sheet for Patients:   StrictlyIdeas.no  Fact Sheet for Healthcare Providers: BankingDealers.co.za  This test is not yet approved or  cleared by the [place] FDA and has been authorized for detection and/or diagnosis of SARS-CoV-2 by FDA under an Emergency Use Authorization (EUA).  This EUA will remain in effect (meaning this test can be used) for the duration of the COVID-19 declaration under Section 564(b)(1) of the Act, 21 U.S.C. section 360bbb-3(b)(1), unless the authorization is terminated or revoked sooner.  Performed at [facility], [place] [address] [place], [place] [address]   SARS Coronavirus 2 by RT PCR (hospital order, performed in [facility] hospital lab) Nasopharyngeal Nasopharyngeal Swab     Status: None   Collection Time: 04/03/20  9:40 PM   Specimen: Nasopharyngeal Swab  Result Value Ref Range Status   SARS Coronavirus 2 NEGATIVE NEGATIVE Final    Comment: (NOTE) SARS-CoV-2 target nucleic acids are NOT DETECTED.  The SARS-CoV-2 RNA is generally detectable in upper and lower respiratory specimens during the acute phase of infection. The lowest concentration of SARS-CoV-2 viral copies this assay can detect is 250 copies / mL. A negative result does not preclude SARS-CoV-2 infection and should not be used as the sole basis for treatment or other patient management decisions.  A negative result may occur with improper specimen collection / handling, submission of specimen other than nasopharyngeal swab, presence of viral mutation(s) within the areas targeted by this assay, and inadequate number of viral copies (<250  copies / mL). A negative result must be combined with clinical observations, patient history, and epidemiological information.  Fact Sheet for Patients:   StrictlyIdeas.no  Fact Sheet for Healthcare Providers: BankingDealers.co.za  This test is not yet approved or  cleared by the [place] FDA and has been authorized for detection and/or diagnosis of SARS-CoV-2 by FDA under an Emergency Use Authorization (EUA).  This EUA will remain in effect (meaning this test can be used) for the duration of the COVID-19 declaration under Section 564(b)(1) of the Act, 21 U.S.C. section 360bbb-3(b)(1), unless the authorization is terminated or revoked sooner.  Performed at [facility], [place] [address] [place], [place] [address]      Labs:  COVID-19 Labs   Lab Results  Component Value Date   [place] 04/03/2020   [place] NEGATIVE 03/30/2020   [place] NEGATIVE 01/03/2020   [place] NEGATIVE 12/25/2019      Basic Metabolic Panel: Recent Labs  Lab 04/03/20 1718 04/04/20 0337 04/04/20 0516 04/05/20 0749 04/07/20 0502  NA 139 134* 134* 138 131*  K 4.5 2.7* 4.1 3.9 3.9  CL 100 112* 102 101 97*  CO2 24 17* 20* 28 24  GLUCOSE 98 120* 155* 158* 141*  BUN 18 13 15 16 11   CREATININE 1.22 0.70 0.87 1.10 0.90  CALCIUM 9.6 5.6* 8.5* 8.2* 8.5*   Liver Function Tests: Recent Labs  Lab 04/03/20 1718  AST 28  ALT 37  ALKPHOS 56  BILITOT 0.6  PROT 7.6  ALBUMIN 4.5   CBC: Recent Labs  Lab 04/03/20 1718 04/04/20 0337 04/07/20 0502  WBC 15.2* 13.5* 10.8*  NEUTROABS 9.6*  --   --   HGB 16.9 14.3 13.6  HCT 50.1 43.7 41.5  MCV 98.0 99.5 100.0  PLT 342 270 227   BNP: BNP (last 3 results) Recent Labs    12/25/19 1856 01/03/20 2235 03/30/20 2034  BNP 27.3 55.2 57.3    CBG: Recent Labs  Lab 04/06/20 1143 04/06/20 1606 04/06/20 2103 04/07/20 0740 04/07/20 1130  GLUCAP 206*  214* 211* 154* 171*     IMAGING STUDIES DG Chest 2 View  Result Date: 03/30/2020 CLINICAL DATA:  Shortness of breath. EXAM: CHEST - 2 VIEW COMPARISON:  March 21, 2020 FINDINGS: Again noted are emphysematous changes bilaterally. There are chronic appearing opacities at the lung bases similar to prior study. There is no pneumothorax. No large pleural effusion. There is no definite acute osseous abnormality. IMPRESSION: No active cardiopulmonary disease. Chronic changes as detailed above, not substantially changed from prior study. Electronically Signed   By: Constance Holster M.D.   On: 03/30/2020 20:26   CT Head Wo Contrast  Result Date: 03/21/2020 CLINICAL DATA:  70 year old male with dizziness. EXAM: CT HEAD WITHOUT CONTRAST TECHNIQUE: Contiguous axial images were obtained from the base of the skull through the vertex without intravenous contrast. COMPARISON:  Head CT dated 12/25/2019. FINDINGS: Brain: There is mild age-related atrophy and chronic microvascular ischemic changes. There is no acute intracranial hemorrhage. No mass effect or midline shift no extra-axial fluid collection. Vascular: No hyperdense vessel or unexpected calcification. Skull: Normal. Negative for fracture or focal lesion. Sinuses/Orbits: No acute finding. Other: None IMPRESSION: 1. No acute intracranial pathology. 2. Mild age-related atrophy and chronic microvascular ischemic changes. Electronically Signed   By: Anner Crete M.D.   On: 03/21/2020 21:10   CT Angio Chest PE W/Cm &/Or Wo Cm  Result Date: 04/03/2020 CLINICAL DATA:  70 year old male with concern for pulmonary embolism. EXAM: CT ANGIOGRAPHY CHEST CT ABDOMEN AND PELVIS WITH CONTRAST TECHNIQUE: Multidetector CT imaging of the chest was performed using the standard protocol during bolus administration of intravenous contrast. Multiplanar CT image reconstructions and MIPs were obtained to evaluate the vascular anatomy. Multidetector CT imaging of the abdomen and  pelvis was performed using the standard protocol during bolus administration of intravenous contrast. CONTRAST:  116mL OMNIPAQUE IOHEXOL 350 MG/ML SOLN COMPARISON:  Chest CT dated 12/26/2019 and radiograph dated 04/03/2020. FINDINGS: CTA CHEST FINDINGS Cardiovascular: There is no cardiomegaly or pericardial effusion. There is coronary vascular calcification. Moderate atherosclerotic calcification of the thoracic aorta. No aneurysmal dilatation. Evaluation of the pulmonary arteries is limited due to suboptimal opacification and timing of the contrast. No definite pulmonary artery embolus identified. Mediastinum/Nodes: No hilar or mediastinal adenopathy. The esophagus and the thyroid gland are grossly unremarkable. No mediastinal fluid collection. Lungs/Pleura: Shallow inspiration. There is severe emphysematous changes of the lungs with bilateral lower lobe subpleural blebs. There is a 3.2 x 2.6 cm focus of ground-glass opacity at the left lung base which may represent scarring, atelectasis, or infiltrate. This is a centrally similar to prior CT. Continued follow-up recommended. No new consolidation. Small left pleural effusion. No pneumothorax. The central airways are patent. Musculoskeletal: Osteopenia with degenerative changes of the spine. No acute osseous pathology. Review of the MIP images confirms the above findings. CT ABDOMEN and PELVIS FINDINGS No intra-abdominal free air or free fluid. Hepatobiliary: There is fatty infiltration of the liver. No intrahepatic biliary dilatation. There is a 2.5 cm cyst in the right lobe of the liver. The gallbladder is unremarkable. Pancreas: Unremarkable. No pancreatic  ductal dilatation or surrounding inflammatory changes. Spleen: Normal in size without focal abnormality. Adrenals/Urinary Tract: The adrenal glands unremarkable. There is no hydronephrosis on either side. There is symmetric enhancement and excretion of contrast by both kidneys. Bilateral renal cysts measure up  to 2 cm on the right. The visualized ureters and urinary bladder appear unremarkable. Stomach/Bowel: There is sigmoid diverticulosis without active inflammatory changes. There is a large amount of stool throughout the colon. There is no bowel obstruction or active inflammation. Normal appendix. Vascular/Lymphatic: Moderate aortoiliac atherosclerotic disease. The IVC is unremarkable. No portal venous gas. There is no adenopathy. Reproductive: The prostate and seminal vesicles are grossly unremarkable. No pelvic mass. Other: None Musculoskeletal: Degenerative changes of the spine. No acute osseous pathology. Review of the MIP images confirms the above findings. IMPRESSION: 1. No CT evidence of pulmonary embolism. 2. Severe emphysema with bilateral lower lobe subpleural blebs. 3. No significant interval change in the rounded ground-glass opacity at the left lung base may represent scarring, atelectasis, or infiltrate. Continued follow-up recommended. 4. Small left pleural effusion. 5. Fatty liver. 6. Sigmoid diverticulosis. No bowel obstruction. Normal appendix. 7. Aortic Atherosclerosis (ICD10-I70.0) and Emphysema (ICD10-J43.9). Electronically Signed   By: Anner Crete M.D.   On: 04/03/2020 20:14   CT Abdomen Pelvis W Contrast  Result Date: 04/03/2020 CLINICAL DATA:  70 year old male with concern for pulmonary embolism. EXAM: CT ANGIOGRAPHY CHEST CT ABDOMEN AND PELVIS WITH CONTRAST TECHNIQUE: Multidetector CT imaging of the chest was performed using the standard protocol during bolus administration of intravenous contrast. Multiplanar CT image reconstructions and MIPs were obtained to evaluate the vascular anatomy. Multidetector CT imaging of the abdomen and pelvis was performed using the standard protocol during bolus administration of intravenous contrast. CONTRAST:  127mL OMNIPAQUE IOHEXOL 350 MG/ML SOLN COMPARISON:  Chest CT dated 12/26/2019 and radiograph dated 04/03/2020. FINDINGS: CTA CHEST FINDINGS  Cardiovascular: There is no cardiomegaly or pericardial effusion. There is coronary vascular calcification. Moderate atherosclerotic calcification of the thoracic aorta. No aneurysmal dilatation. Evaluation of the pulmonary arteries is limited due to suboptimal opacification and timing of the contrast. No definite pulmonary artery embolus identified. Mediastinum/Nodes: No hilar or mediastinal adenopathy. The esophagus and the thyroid gland are grossly unremarkable. No mediastinal fluid collection. Lungs/Pleura: Shallow inspiration. There is severe emphysematous changes of the lungs with bilateral lower lobe subpleural blebs. There is a 3.2 x 2.6 cm focus of ground-glass opacity at the left lung base which may represent scarring, atelectasis, or infiltrate. This is a centrally similar to prior CT. Continued follow-up recommended. No new consolidation. Small left pleural effusion. No pneumothorax. The central airways are patent. Musculoskeletal: Osteopenia with degenerative changes of the spine. No acute osseous pathology. Review of the MIP images confirms the above findings. CT ABDOMEN and PELVIS FINDINGS No intra-abdominal free air or free fluid. Hepatobiliary: There is fatty infiltration of the liver. No intrahepatic biliary dilatation. There is a 2.5 cm cyst in the right lobe of the liver. The gallbladder is unremarkable. Pancreas: Unremarkable. No pancreatic ductal dilatation or surrounding inflammatory changes. Spleen: Normal in size without focal abnormality. Adrenals/Urinary Tract: The adrenal glands unremarkable. There is no hydronephrosis on either side. There is symmetric enhancement and excretion of contrast by both kidneys. Bilateral renal cysts measure up to 2 cm on the right. The visualized ureters and urinary bladder appear unremarkable. Stomach/Bowel: There is sigmoid diverticulosis without active inflammatory changes. There is a large amount of stool throughout the colon. There is no bowel  obstruction or active inflammation. Normal  appendix. Vascular/Lymphatic: Moderate aortoiliac atherosclerotic disease. The IVC is unremarkable. No portal venous gas. There is no adenopathy. Reproductive: The prostate and seminal vesicles are grossly unremarkable. No pelvic mass. Other: None Musculoskeletal: Degenerative changes of the spine. No acute osseous pathology. Review of the MIP images confirms the above findings. IMPRESSION: 1. No CT evidence of pulmonary embolism. 2. Severe emphysema with bilateral lower lobe subpleural blebs. 3. No significant interval change in the rounded ground-glass opacity at the left lung base may represent scarring, atelectasis, or infiltrate. Continued follow-up recommended. 4. Small left pleural effusion. 5. Fatty liver. 6. Sigmoid diverticulosis. No bowel obstruction. Normal appendix. 7. Aortic Atherosclerosis (ICD10-I70.0) and Emphysema (ICD10-J43.9). Electronically Signed   By: Anner Crete M.D.   On: 04/03/2020 20:14   DG Chest Port 1 View  Result Date: 04/03/2020 CLINICAL DATA:  Shortness of breath EXAM: PORTABLE CHEST 1 VIEW COMPARISON:  Chest radiograph March 30, 2020. FINDINGS: The heart size and mediastinal contours are within normal limits. Chronic bilateral emphysematous changes. Left basilar airspace opacity. No pleural effusion. No pneumothorax. The visualized skeletal structures are unchanged. IMPRESSION: 1. Left basilar airspace opacity suspicious for pneumonia. 2. Chronic bilateral emphysematous changes. Electronically Signed   By: Dahlia Bailiff MD   On: 04/03/2020 18:42   DG Chest Portable 1 View  Result Date: 03/21/2020 CLINICAL DATA:  Shortness of breath, weakness, and dizziness beginning about our ago. History of COPD. Former smoker. EXAM: PORTABLE CHEST 1 VIEW COMPARISON:  01/06/2020 FINDINGS: Technical quality of the examination is limited by body habitus. Heart size and pulmonary vascularity are normal for technique. Emphysematous changes are  suggested in the lungs. Linear atelectasis or infiltration in the lung bases is similar to prior study. No pleural effusion. No pneumothorax. Mediastinal contours appear intact. IMPRESSION: Emphysematous changes in the lungs. Infiltration or atelectasis in the lung bases, similar to previous study. Electronically Signed   By: Lucienne Capers M.D.   On: 03/21/2020 19:36   DG Toe Great Right  Result Date: 03/27/2020 CLINICAL DATA:  Pain EXAM: RIGHT GREAT TOE COMPARISON:  None. FINDINGS: There are mild degenerative changes of the first metatarsophalangeal joint and interphalangeal joint. These appear to be stable since the prior study. There are no convincing erosions. No acute displaced fracture or dislocation. IMPRESSION: Mild degenerative changes are noted of the first digit, not substantially changed from prior study. No definite radiographic evidence to suggest an underlying diagnosis of gout. Electronically Signed   By: Constance Holster M.D.   On: 03/27/2020 19:13    DISCHARGE EXAMINATION: Vitals:   04/07/20 0428 04/07/20 0751 04/07/20 0800 04/07/20 1426  BP: 105/69   119/68  Pulse: 73   92  Resp: 20   14  Temp: 100.3 F (37.9 C)  97.6 F (36.4 C) 98.3 F (36.8 C)  TempSrc: Oral  Oral   SpO2: 92% 94%  (!) 88%  Weight:      Height:       General appearance: Awake alert.  In no distress Resp: Clear to auscultation bilaterally.  Normal effort Cardio: S1-S2 is normal regular.  No S3-S4.  No rubs murmurs or bruit GI: Abdomen is soft.  Nontender nondistended.  Bowel sounds are present normal.  No masses organomegaly    DISPOSITION: Home  Discharge Instructions    Call MD for:  difficulty breathing, headache or visual disturbances   Complete by: As directed    Call MD for:  extreme fatigue   Complete by: As directed    Call MD  for:  persistant dizziness or light-headedness   Complete by: As directed    Call MD for:  persistant nausea and vomiting   Complete by: As directed     Call MD for:  severe uncontrolled pain   Complete by: As directed    Call MD for:  temperature >100.4   Complete by: As directed    Diet - low sodium heart healthy   Complete by: As directed    Discharge instructions   Complete by: As directed    Please take your medications as prescribed.  Follow-up with your primary care provider in 1 to 2 weeks.  Use oxygen around-the-clock for now especially with exertion.  You were cared for by a hospitalist during your hospital stay. If you have any questions about your discharge medications or the care you received while you were in the hospital after you are discharged, you can call the unit and asked to speak with the hospitalist on call if the hospitalist that took care of you is not available. Once you are discharged, your primary care physician will handle any further medical issues. Please note that NO REFILLS for any discharge medications will be authorized once you are discharged, as it is imperative that you return to your primary care physician (or establish a relationship with a primary care physician if you do not have one) for your aftercare needs so that they can reassess your need for medications and monitor your lab values. If you do not have a primary care physician, you can call [phone] for a physician referral.   Increase activity slowly   Complete by: As directed         Allergies as of 04/07/2020      Reactions   Demerol [meperidine] Nausea And Vomiting, Other (See Comments)   Made the patient "violently sick"   Zocor [simvastatin] Nausea And Vomiting, Other (See Comments)   Made him very jittery, also   Beet [beta Vulgaris] Nausea And Vomiting   Liver Nausea And Vomiting      Medication List    STOP taking these medications   HYDROmorphone 2 MG tablet Commonly known as: Dilaudid     TAKE these medications   acetaminophen 500 MG tablet Commonly known as: TYLENOL Take 1,000 mg by mouth every 6 (six) hours as needed for  moderate pain.   azithromycin 500 MG tablet Commonly known as: ZITHROMAX Take 1 tablet (500 mg total) by mouth daily for 3 days.   b complex vitamins capsule Take 1 capsule by mouth daily.   busPIRone 15 MG tablet Commonly known as: BUSPAR Take 15 mg by mouth daily.   calcium-vitamin D 500-200 MG-UNIT tablet Commonly known as: OSCAL WITH D Take 1 tablet by mouth 2 (two) times daily with a meal.   cefdinir 300 MG capsule Commonly known as: OMNICEF Take 1 capsule (300 mg total) by mouth every 12 (twelve) hours for 4 days.   cyclobenzaprine 10 MG tablet Commonly known as: FLEXERIL Take 10 mg by mouth at bedtime.   diltiazem 180 MG 24 hr capsule Commonly known as: CARDIZEM CD Take 180 mg by mouth daily.   docusate calcium 240 MG capsule Commonly known as: SURFAK Take 240 mg by mouth daily.   DULoxetine 60 MG capsule Commonly known as: CYMBALTA Take 1 capsule (60 mg total) by mouth 2 (two) times daily. What changed: when to take this   Enbrel 50 MG/ML injection Generic drug: etanercept Inject 50 mg into the skin every  Wednesday.   fluticasone 50 MCG/ACT nasal spray Commonly known as: FLONASE Place 2 sprays into both nostrils daily.   folic acid 1 MG tablet Commonly known as: FOLVITE Take 1 mg by mouth daily.   gabapentin 300 MG capsule Commonly known as: NEURONTIN Take 900 mg by mouth at bedtime.   lisinopril 20 MG tablet Commonly known as: ZESTRIL Take 20 mg by mouth daily.   lurasidone 40 MG Tabs tablet Commonly known as: LATUDA Take 40 mg by mouth daily after supper.   melatonin 3 MG Tabs tablet Take 6 mg by mouth at bedtime.   metFORMIN 500 MG tablet Commonly known as: GLUCOPHAGE Take 500 mg by mouth 2 (two) times daily with a meal.   methotrexate 2.5 MG tablet Commonly known as: RHEUMATREX Take 6 tablets (15 mg total) by mouth every Saturday. Hold until seen by rheumatology  Caution:Chemotherapy. Protect from light. What changed: additional  instructions   multivitamin with minerals Tabs tablet Take 1 tablet by mouth daily.   mycophenolate 250 MG capsule Commonly known as: CELLCEPT Take 1,500 mg by mouth 2 (two) times daily.   nitroGLYCERIN 0.4 MG SL tablet Commonly known as: NITROSTAT Place 1 tablet (0.4 mg total) under the tongue every 5 (five) minutes as needed for chest pain.   OVER THE COUNTER MEDICATION Place 1 drop into both ears daily. Miracell ProEar for irritated itchy ears   OVER THE COUNTER MEDICATION Take 2 tablets by mouth daily. Pure Zzz   Oxcarbazepine 300 MG tablet Commonly known as: TRILEPTAL Take 600 mg by mouth 3 (three) times daily.   polyethylene glycol 17 g packet Commonly known as: MIRALAX / GLYCOLAX Take 17 g by mouth daily.   PRESCRIPTION MEDICATION Inhale into the lungs See admin instructions. CPAP- At bedtime and during during any naps   senna-docusate 8.6-50 MG tablet Commonly known as: Senokot-S Take 1 tablet by mouth 2 (two) times daily.   sodium chloride 0.65 % Soln nasal spray Commonly known as: OCEAN Place 2 sprays into both nostrils 4 (four) times daily as needed for congestion.   Stiolto Respimat 2.5-2.5 MCG/ACT Aers Generic drug: Tiotropium Bromide-Olodaterol Inhale 2 puffs into the lungs daily.   Systane Balance 0.6 % Soln Generic drug: Propylene Glycol Place 1 drop into both eyes in the morning, at noon, and at bedtime.   tamsulosin 0.4 MG Caps capsule Commonly known as: FLOMAX Take 0.4 mg by mouth daily.   traMADol 50 MG tablet Commonly known as: ULTRAM Take 100 mg by mouth at bedtime.   traZODone 50 MG tablet Commonly known as: DESYREL Take 75 mg by mouth at bedtime.   VITAMIN A PO Take 1 tablet by mouth daily.   vitamin C 500 MG tablet Commonly known as: ASCORBIC ACID Take 500 mg by mouth 2 (two) times daily.   Vitamin D-3 25 MCG (1000 UT) Caps Take 1,000 Units by mouth daily.            Durable Medical Equipment  (From admission, onward)          Start     Ordered   04/07/20 1004  For home use only DME oxygen  Once       Question Answer Comment  Length of Need 6 Months   Mode or (Route) Nasal cannula   Liters per Minute 2   Frequency Continuous (stationary and portable oxygen unit needed)   Oxygen conserving device Yes   Oxygen delivery system Gas      04/07/20 1003  Follow-up Information    Clinic, [place]. Schedule an appointment as soon as possible for a visit in 1 week(s).   Contact information: [place] [address] [phone]        [facility] Follow up.   Why: home oxygen Contact information: tel# [phone]              TOTAL DISCHARGE TIME: 35 minutes  Mariusz Jubb [employer] on [email or web address]  04/07/2020, 3:49 PM

## 2020-04-07 NOTE — Progress Notes (Signed)
   04/07/20 1200  Mobility  Activity Ambulated in hall  Level of Assistance Contact guard assist, steadying assist  Assistive Device Cane  Distance Ambulated (ft) 108 ft  Mobility Response Tolerated well  Mobility performed by Mobility specialist  $Mobility charge 1 Mobility    Mobility Specialist: Progress Note  Pre-Mobility: 82 HR, 93% SpO2 During Mobility: 109 HR, 89% SpO2 Post-Mobility: 98 HR, 95% SpO2   Pt tolerated ambulation well. Ambulated in hall on 2 liters O2. Sats dropped to 89% during ambulation, however recovered to 95% quickly. Pt complained of right lower back pain during ambulation as well as a headache pre ambulation. Pt left EOB with call light in reach. RN notified.   Mario [name] Mobility Specialist/Rehab Tech [facility] [employer]

## 2020-04-07 NOTE — Discharge Instructions (Signed)
Community-Acquired Pneumonia, Adult Pneumonia is a lung infection that causes inflammation and the buildup of mucus and fluids in the lungs. This may cause coughing and difficulty breathing. Community-acquired pneumonia is pneumonia that develops in people who are not, and have not recently been, in a hospital or other health care facility. Usually, pneumonia develops as a result of an illness that is caused by a virus, such as the common cold and the flu (influenza). It can also be caused by bacteria or fungi. While the common cold and influenza can pass from person to person (are contagious), pneumonia itself is not considered contagious. What are the causes? This condition may be caused by:  Viruses.  Bacteria.  Fungi, such as molds or mushrooms.   What increases the risk? The following factors may make you more likely to develop this condition:  Having certain medical conditions, such as: ? A long-term (chronic) disease, which may include chronic obstructive pulmonary disease (COPD), asthma, heart failure, cystic fibrosis, diabetes, kidney disease, sickle cell disease, and human immunodeficiency virus (HIV). ? A condition that increases the risk of breathing in (aspirating) mucus and other fluids from your mouth and nose. ? A weakened body defense system (immune system).  Having had your spleen removed (splenectomy). The spleen is the organ that helps fight germs and infections.  Not cleaning your teeth and gums well (poor dental hygiene).  Using tobacco products.  Traveling to places where germs that cause pneumonia are present.  Being near certain animals, or animal habitats, that have germs that cause pneumonia.  Being older than 69 years of age. What are the signs or symptoms? Symptoms of this condition include:  A dry cough or a wet (productive) cough.  A fever.  Sweating or chills.  Chest pain, especially when breathing deeply or coughing.  Fast breathing,  difficulty breathing, or shortness of breath.  Tiredness (fatigue).  Muscle aches. How is this diagnosed? This condition may be diagnosed based on your medical history or a physical exam. You may also have tests, including:  Chest X-rays.  Tests of the level of oxygen and other gases in your blood.  Tests of: ? Your blood. ? Mucus from your lungs (sputum). ? Fluid around your lungs (pleural fluid). ? Your urine. If your pneumonia is severe, other tests may be done to learn more about the cause.   How is this treated? Treatment for this condition depends on many factors, such as the cause of your pneumonia, your medicines, and other medical conditions that you have. For most adults, pneumonia may be treated at home. In some cases, treatment must happen in a hospital and may include:  Medicines that are given by mouth (orally) or through an IV, including: ? Antibiotic medicines, if bacteria caused the pneumonia. ? Medicines that kill viruses (antiviral medicines), if a virus caused the pneumonia.  Oxygen therapy. Severe pneumonia, although rare, may require the following treatments:  Mechanical ventilation.This procedure uses a machine to help you breathe if you cannot breathe well on your own or maintain a safe level of blood oxygen.  Thoracentesis. This procedure removes any buildup of pleural fluid to help with breathing. Follow these instructions at home: Medicines  Take over-the-counter and prescription medicines only as told by your health care provider.  Take cough medicine only if you have trouble sleeping. Cough medicine can prevent your body from removing mucus from your lungs.  If you were prescribed an antibiotic medicine, take it as told by your health care  provider. Do not stop taking the antibiotic even if you start to feel better. Lifestyle  Do not drink alcohol.  Do not use any products that contain nicotine or tobacco, such as cigarettes, e-cigarettes, and  chewing tobacco. If you need help quitting, ask your health care provider.  Eat a healthy diet. This includes plenty of vegetables, fruits, whole grains, low-fat dairy products, and lean protein.      General instructions  Rest a lot and get at least 8 hours of sleep each night.  Sleep in a partly upright position at night. Place a few pillows under your head or sleep in a reclining chair.  Return to your normal activities as told by your health care provider. Ask your health care provider what activities are safe for you.  Drink enough fluid to keep your urine pale yellow. This helps to thin the mucus in your lungs.  If your throat is sore, gargle with a salt-water mixture 3-4 times a day or as needed. To make a salt-water mixture, completely dissolve -1 tsp (3-6 g) of salt in 1 cup (237 mL) of warm water.  Keep all follow-up visits as told by your health care provider. This is important.   How is this prevented? You can lower your risk of developing community-acquired pneumonia by:  Getting the pneumonia vaccine. There are different types and schedules of pneumonia vaccines. Ask your health care provider which option is best for you. Consider getting the pneumonia vaccine if: ? You are older than 69 years of age. ? You are 66-47 years of age and are receiving cancer treatment, have chronic lung disease, or have other medical conditions that affect your immune system. Ask your health care provider if this applies to you.  Getting your influenza vaccine every year. Ask your health care provider which type of vaccine is best for you.  Getting regular dental checkups.  Washing your hands often with soap and water for at least 20 seconds. If soap and water are not available, use hand sanitizer. Contact a health care provider if you have:  A fever.  Trouble sleeping because you cannot control your cough with cough medicine. Get help right away if:  Your shortness of breath becomes  worse.  Your chest pain increases.  Your sickness becomes worse, especially if you are an older adult or have a weak immune system.  You cough up blood. These symptoms may represent a serious problem that is an emergency. Do not wait to see if the symptoms will go away. Get medical help right away. Call your local emergency services (911 in the [place] Do not drive yourself to the hospital. Summary  Pneumonia is an infection of the lungs.  Community-acquired pneumonia develops in people who have not been in the hospital. It can be caused by bacteria, viruses, or fungi.  This condition may be treated with antibiotics or antiviral medicines.  Severe pneumonia may require a hospital stay and treatment to help with breathing. This information is not intended to replace advice given to you by your health care provider. Make sure you discuss any questions you have with your health care provider. Document Revised: 11/27/2018 Document Reviewed: 11/27/2018 Elsevier Patient Education  2021 [place].    Constipation, Adult Constipation is when a person has fewer than three bowel movements in a week, has difficulty having a bowel movement, or has stools (feces) that are dry, hard, or larger than normal. Constipation may be caused by an underlying condition. It  may become worse with age if a person takes certain medicines and does not take in enough fluids. Follow these instructions at home: Eating and drinking  Eat foods that have a lot of fiber, such as beans, whole grains, and fresh fruits and vegetables.  Limit foods that are low in fiber and high in fat and processed sugars, such as fried or sweet foods. These include french fries, hamburgers, cookies, candies, and soda.  Drink enough fluid to keep your urine pale yellow.   General instructions  Exercise regularly or as told by your health care provider. Try to do 150 minutes of moderate exercise each week.  Use the bathroom when you  have the urge to go. Do not hold it in.  Take over-the-counter and prescription medicines only as told by your health care provider. This includes any fiber supplements.  During bowel movements: ? Practice deep breathing while relaxing the lower abdomen. ? Practice pelvic floor relaxation.  Watch your condition for any changes. Let your health care provider know about them.  Keep all follow-up visits as told by your health care provider. This is important. Contact a health care provider if:  You have pain that gets worse.  You have a fever.  You do not have a bowel movement after 4 days.  You vomit.  You are not hungry or you lose weight.  You are bleeding from the opening between the buttocks (anus).  You have thin, pencil-like stools. Get help right away if:  You have a fever and your symptoms suddenly get worse.  You leak stool or have blood in your stool.  Your abdomen is bloated.  You have severe pain in your abdomen.  You feel dizzy or you faint. Summary  Constipation is when a person has fewer than three bowel movements in a week, has difficulty having a bowel movement, or has stools (feces) that are dry, hard, or larger than normal.  Eat foods that have a lot of fiber, such as beans, whole grains, and fresh fruits and vegetables.  Drink enough fluid to keep your urine pale yellow.  Take over-the-counter and prescription medicines only as told by your health care provider. This includes any fiber supplements. This information is not intended to replace advice given to you by your health care provider. Make sure you discuss any questions you have with your health care provider. Document Revised: 01/02/2019 Document Reviewed: 01/02/2019 Elsevier Patient Education  [place].

## 2020-04-11 ENCOUNTER — Encounter (HOSPITAL_COMMUNITY): Payer: Self-pay | Admitting: Emergency Medicine

## 2020-04-11 ENCOUNTER — Emergency Department (HOSPITAL_COMMUNITY)
Admission: EM | Admit: 2020-04-11 | Discharge: 2020-04-11 | Disposition: A | Discharge status: Home / Self Care | Payer: No Typology Code available for payment source | Attending: Emergency Medicine | Admitting: Emergency Medicine

## 2020-04-11 ENCOUNTER — Other Ambulatory Visit: Payer: Self-pay

## 2020-04-11 DIAGNOSIS — Z79899 Other long term (current) drug therapy: Secondary | ICD-10-CM | POA: Insufficient documentation

## 2020-04-11 DIAGNOSIS — I129 Hypertensive chronic kidney disease with stage 1 through stage 4 chronic kidney disease, or unspecified chronic kidney disease: Secondary | ICD-10-CM | POA: Insufficient documentation

## 2020-04-11 DIAGNOSIS — F419 Anxiety disorder, unspecified: Secondary | ICD-10-CM | POA: Insufficient documentation

## 2020-04-11 DIAGNOSIS — E1122 Type 2 diabetes mellitus with diabetic chronic kidney disease: Secondary | ICD-10-CM | POA: Insufficient documentation

## 2020-04-11 DIAGNOSIS — N1831 Chronic kidney disease, stage 3a: Secondary | ICD-10-CM | POA: Diagnosis not present

## 2020-04-11 DIAGNOSIS — F410 Panic disorder [episodic paroxysmal anxiety] without agoraphobia: Secondary | ICD-10-CM | POA: Insufficient documentation

## 2020-04-11 DIAGNOSIS — J441 Chronic obstructive pulmonary disease with (acute) exacerbation: Secondary | ICD-10-CM | POA: Insufficient documentation

## 2020-04-11 DIAGNOSIS — Z87891 Personal history of nicotine dependence: Secondary | ICD-10-CM | POA: Diagnosis not present

## 2020-04-11 DIAGNOSIS — Z7984 Long term (current) use of oral hypoglycemic drugs: Secondary | ICD-10-CM | POA: Diagnosis not present

## 2020-04-11 DIAGNOSIS — Z7952 Long term (current) use of systemic steroids: Secondary | ICD-10-CM | POA: Insufficient documentation

## 2020-04-11 DIAGNOSIS — R000 Tachycardia, unspecified: Secondary | ICD-10-CM | POA: Diagnosis not present

## 2020-04-11 LAB — CBC WITH DIFFERENTIAL/PLATELET
Abs Immature Granulocytes: 0.06 10*3/uL (ref 0.00–0.07)
Basophils Absolute: 0.1 10*3/uL (ref 0.0–0.1)
Basophils Relative: 1 %
Eosinophils Absolute: 0.2 10*3/uL (ref 0.0–0.5)
Eosinophils Relative: 2 %
HCT: 49 % (ref 39.0–52.0)
Hemoglobin: 16 g/dL (ref 13.0–17.0)
Immature Granulocytes: 1 %
Lymphocytes Relative: 32 %
Lymphs Abs: 3.8 10*3/uL (ref 0.7–4.0)
MCH: 32.6 pg (ref 26.0–34.0)
MCHC: 32.7 g/dL (ref 30.0–36.0)
MCV: 99.8 fL (ref 80.0–100.0)
Monocytes Absolute: 1.1 10*3/uL — ABNORMAL HIGH (ref 0.1–1.0)
Monocytes Relative: 9 %
Neutro Abs: 6.7 10*3/uL (ref 1.7–7.7)
Neutrophils Relative %: 55 %
Platelets: 354 10*3/uL (ref 150–400)
RBC: 4.91 MIL/uL (ref 4.22–5.81)
RDW: 13 % (ref 11.5–15.5)
WBC: 12 10*3/uL — ABNORMAL HIGH (ref 4.0–10.5)
nRBC: 0 % (ref 0.0–0.2)

## 2020-04-11 LAB — COMPREHENSIVE METABOLIC PANEL
ALT: 31 U/L (ref 0–44)
AST: 20 U/L (ref 15–41)
Albumin: 4.2 g/dL (ref 3.5–5.0)
Alkaline Phosphatase: 60 U/L (ref 38–126)
Anion gap: 14 (ref 5–15)
BUN: 15 mg/dL (ref 8–23)
CO2: 21 mmol/L — ABNORMAL LOW (ref 22–32)
Calcium: 9.2 mg/dL (ref 8.9–10.3)
Chloride: 101 mmol/L (ref 98–111)
Creatinine, Ser: 1.31 mg/dL — ABNORMAL HIGH (ref 0.61–1.24)
GFR, Estimated: 59 mL/min — ABNORMAL LOW (ref >60)
Glucose, Bld: 266 mg/dL — ABNORMAL HIGH (ref 70–99)
Potassium: 4.4 mmol/L (ref 3.5–5.1)
Sodium: 136 mmol/L (ref 135–145)
Total Bilirubin: 0.5 mg/dL (ref 0.3–1.2)
Total Protein: 7.3 g/dL (ref 6.5–8.1)

## 2020-04-11 LAB — ETHANOL: Alcohol, Ethyl (B): <10 mg/dL (ref <10)

## 2020-04-11 MED ORDER — ONDANSETRON HCL 4 MG PO TABS
8.0000 mg | ORAL_TABLET | Freq: Once | ORAL | Status: AC
  Administered 2020-04-11: 8 mg via ORAL
  Filled 2020-04-11: qty 2

## 2020-04-11 MED ORDER — OXYCODONE-ACETAMINOPHEN 5-325 MG PO TABS
2.0000 | ORAL_TABLET | Freq: Once | ORAL | Status: AC
  Administered 2020-04-11: 2 via ORAL
  Filled 2020-04-11: qty 2
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
## 2020-04-11 NOTE — Discharge Instructions (Signed)
We suspect that your symptoms are secondary to anxiety. We had sent a message to the behavioral health urgent care, to further assist you.  You have requested to be discharged before we are able to get you transferred over there, and opted to call your own doctor instead.  Provided his information for the behavioral health urgent care.  They are able to help patients with symptoms like anxiety, panic attacks.  Please consider going there tonight or tomorrow if you are not feeling better.

## 2020-04-14 ENCOUNTER — Encounter (HOSPITAL_COMMUNITY): Payer: Self-pay | Admitting: Emergency Medicine

## 2020-04-14 ENCOUNTER — Other Ambulatory Visit: Payer: Self-pay

## 2020-04-14 ENCOUNTER — Emergency Department (HOSPITAL_COMMUNITY): Payer: No Typology Code available for payment source

## 2020-04-14 ENCOUNTER — Emergency Department (HOSPITAL_COMMUNITY)
Admission: EM | Admit: 2020-04-14 | Discharge: 2020-04-14 | Disposition: A | Discharge status: Home / Self Care | Payer: No Typology Code available for payment source | Attending: Emergency Medicine | Admitting: Emergency Medicine

## 2020-04-14 DIAGNOSIS — N183 Chronic kidney disease, stage 3 unspecified: Secondary | ICD-10-CM | POA: Insufficient documentation

## 2020-04-14 DIAGNOSIS — J449 Chronic obstructive pulmonary disease, unspecified: Secondary | ICD-10-CM | POA: Diagnosis not present

## 2020-04-14 DIAGNOSIS — K5900 Constipation, unspecified: Secondary | ICD-10-CM | POA: Diagnosis present

## 2020-04-14 DIAGNOSIS — K5901 Slow transit constipation: Secondary | ICD-10-CM | POA: Diagnosis not present

## 2020-04-14 DIAGNOSIS — Z87891 Personal history of nicotine dependence: Secondary | ICD-10-CM | POA: Diagnosis not present

## 2020-04-14 DIAGNOSIS — I129 Hypertensive chronic kidney disease with stage 1 through stage 4 chronic kidney disease, or unspecified chronic kidney disease: Secondary | ICD-10-CM | POA: Insufficient documentation

## 2020-04-14 DIAGNOSIS — Z79899 Other long term (current) drug therapy: Secondary | ICD-10-CM | POA: Diagnosis not present

## 2020-04-14 DIAGNOSIS — R000 Tachycardia, unspecified: Secondary | ICD-10-CM | POA: Insufficient documentation

## 2020-04-14 LAB — COMPREHENSIVE METABOLIC PANEL
ALT: 30 U/L (ref 0–44)
AST: 23 U/L (ref 15–41)
Albumin: 4.2 g/dL (ref 3.5–5.0)
Alkaline Phosphatase: 55 U/L (ref 38–126)
Anion gap: 12 (ref 5–15)
BUN: 17 mg/dL (ref 8–23)
CO2: 25 mmol/L (ref 22–32)
Calcium: 9.2 mg/dL (ref 8.9–10.3)
Chloride: 99 mmol/L (ref 98–111)
Creatinine, Ser: 1.6 mg/dL — ABNORMAL HIGH (ref 0.61–1.24)
GFR, Estimated: 47 mL/min — ABNORMAL LOW (ref >60)
Glucose, Bld: 154 mg/dL — ABNORMAL HIGH (ref 70–99)
Potassium: 4.3 mmol/L (ref 3.5–5.1)
Sodium: 136 mmol/L (ref 135–145)
Total Bilirubin: 0.5 mg/dL (ref 0.3–1.2)
Total Protein: 7.3 g/dL (ref 6.5–8.1)

## 2020-04-14 LAB — CBC WITH DIFFERENTIAL/PLATELET
Abs Immature Granulocytes: 0.04 10*3/uL (ref 0.00–0.07)
Basophils Absolute: 0.1 10*3/uL (ref 0.0–0.1)
Basophils Relative: 1 %
Eosinophils Absolute: 0.2 10*3/uL (ref 0.0–0.5)
Eosinophils Relative: 2 %
HCT: 47.3 % (ref 39.0–52.0)
Hemoglobin: 15.8 g/dL (ref 13.0–17.0)
Immature Granulocytes: 0 %
Lymphocytes Relative: 34 %
Lymphs Abs: 3.7 10*3/uL (ref 0.7–4.0)
MCH: 33.1 pg (ref 26.0–34.0)
MCHC: 33.4 g/dL (ref 30.0–36.0)
MCV: 99 fL (ref 80.0–100.0)
Monocytes Absolute: 0.9 10*3/uL (ref 0.1–1.0)
Monocytes Relative: 8 %
Neutro Abs: 6 10*3/uL (ref 1.7–7.7)
Neutrophils Relative %: 55 %
Platelets: 330 10*3/uL (ref 150–400)
RBC: 4.78 MIL/uL (ref 4.22–5.81)
RDW: 12.6 % (ref 11.5–15.5)
WBC: 10.8 10*3/uL — ABNORMAL HIGH (ref 4.0–10.5)
nRBC: 0 % (ref 0.0–0.2)

## 2020-04-14 LAB — URINALYSIS, ROUTINE W REFLEX MICROSCOPIC
Bilirubin Urine: NEGATIVE
Glucose, UA: NEGATIVE mg/dL
Hgb urine dipstick: NEGATIVE
Ketones, ur: 5 mg/dL — AB
Leukocytes,Ua: NEGATIVE
Nitrite: NEGATIVE
Protein, ur: NEGATIVE mg/dL
Specific Gravity, Urine: 1.019 (ref 1.005–1.030)
pH: 6 (ref 5.0–8.0)

## 2020-04-14 MED ORDER — SODIUM CHLORIDE 0.9 % IV BOLUS: 500.0000 mL | Freq: Once | INTRAVENOUS | Status: DC

## 2020-04-14 MED ORDER — SORBITOL 70 % SOLN
960.0000 mL | TOPICAL_OIL | Freq: Once | ORAL | Status: AC
  Administered 2020-04-14: 960 mL via RECTAL
  Filled 2020-04-14: qty 473

## 2020-04-14 NOTE — ED Notes (Addendum)
Pt had successful small BM after enema administration. MD made aware.

## 2020-04-14 NOTE — ED Notes (Signed)
MD/RN suggested another round of enema administration, pt refused.

## 2020-04-14 NOTE — Discharge Instructions (Signed)
You may increase your Senokot to three times a day until you have multiple bowel movements. He may also increase your MiraLAX to 3 to 4 times daily. Drink plenty of fluids. Your kidney function needs to be rechecked in the next week. Please follow-up with your family doctor for recheck.

## 2020-04-14 NOTE — ED Triage Notes (Signed)
Per EMS, pt from home with c/o SOB x 4 days that's worse on exertion and with lying flat. Hx of COPD. Pt also c/o of constipation with LBM being 10-12 days ago. Per ems no distention or tenderness noted. Pt reports being dx with Pneumonia on 1/28 and still currently taking abx. 92% on room air , EMS placed pt on [facility] for comfort and sats increased to 95%.

## 2020-04-14 NOTE — ED Notes (Signed)
After IV access was established, pt request IV to be taken out. IV removed at this time.

## 2020-04-14 NOTE — ED Provider Notes (Signed)
[facility] DEPT Provider Note   CSN: [id number] Arrival date & time: 04/14/20  1712     History Chief Complaint  Patient presents with  . Shortness of Breath  . Constipation    Mario [name] is a 70 y.o. male.  The history is provided by the patient and medical records.  Shortness of Breath Constipation  Mario [name] is a 70 y.o. male who presents to the Emergency Department complaining of constipation. He presents the emergency department for evaluation of constipation. He states that he has not had a bowel movement in about 10 days. He has a history of COPD and was recently admitted to the hospital for pneumonia and constipation. He was feeling improved at the time of hospital discharge but now has not been having bowel movements despite taking all medications as he was prescribed. Due to his COPD he is short of breath at baseline. He is unsure if this is any worse than his baseline. He denies any fevers, chest pain, nausea, vomiting, dysuria, leg swelling or pain. He does have some mild lower abdominal discomfort at times. No additional symptoms.    Past Medical History:  Diagnosis Date  . Anxiety   . Bronchitis   . COPD (chronic obstructive pulmonary disease) ([place])   . Depression   . Hypertension   . Mental disorder   . MI (myocardial infarction) ([place])    ????  . OSA (obstructive sleep apnea)   . Suicide attempt ([place])   . Tension pneumothorax 06/27/2016  . Uveitis     Patient Active Problem List   Diagnosis Date Noted  . Drug-induced constipation   . CAP (community acquired pneumonia) 04/03/2020  . Acute exacerbation of chronic obstructive pulmonary disease (COPD) ([place]) 01/05/2020  . Vertigo 12/26/2019  . 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 Stage 3a chronic kidney disease ([place]) 12/26/2019  . Abnormal CT of the chest 12/26/2019  . COPD (chronic obstructive pulmonary disease) ([place])   . AKI (acute kidney injury) ([place])   .  Hyperglycemia 06/23/2019  . OSA (obstructive sleep apnea) 06/23/2019  . Suicidal ideation 06/23/2019  . Major depressive disorder, recurrent episode ([place]) 10/12/2018  . 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 Cellulitis of both feet 07/16/2017  . DM2 (diabetes mellitus, type 2) ([place]) 07/16/2017  . HLA B27 (HLA B27 positive) 07/16/2017  . Acute chest pain   . Hyperkalemia   . Spontaneous pneumothorax 06/27/2016  . Hypoxia   . Pneumothorax on left   . Dyspnea 05/23/2014  . COPD exacerbation ([place]) 05/23/2014  . Malingering 01/21/2013  . Depression 01/11/2013  . Tobacco abuse 01/11/2013  . Obesity, unspecified 01/11/2013  . Chest pain 01/10/2013  . HTN (hypertension) 01/10/2013    Past Surgical History:  Procedure Laterality Date  . CHEST TUBE INSERTION Left 06/27/2016  . cryptorchidism    . SKIN CANCER EXCISION         Family History  Problem Relation Age of Onset  . Dementia Father     Social History   Tobacco Use  . Smoking status: Former Smoker    Packs/day: 1.00    Years: 35.00    Pack years: 35.00    Types: Cigarettes    Quit date: 05/2016    Years since quitting: 3.8  . Smokeless tobacco: Never Used  Vaping Use  . Vaping Use: Never used  Substance Use Topics  . Alcohol use: No    Alcohol/week: 0.0 standard  drinks    Comment: denies use of any drugs or alcohol  . Drug use: No    Home Medications Prior to Admission medications   Medication Sig Start Date End Date Taking? Authorizing Provider  acetaminophen (TYLENOL) 500 MG tablet Take 1,000 mg by mouth every 6 (six) hours as needed for moderate pain.    [provider]  b complex vitamins capsule Take 1 capsule by mouth daily.    [provider]  busPIRone (BUSPAR) 15 MG tablet Take 15 mg by mouth daily.     [provider]  calcium-vitamin D (OSCAL WITH D) 500-200 MG-UNIT tablet Take 1 tablet by mouth 2 (two) times daily with a meal.     [provider]  Cholecalciferol (VITAMIN D-3) 25 MCG (1000 UT) CAPS Take 1,000 Units by mouth daily.    [provider]  cyclobenzaprine (FLEXERIL) 10 MG tablet Take 10 mg by mouth at bedtime.    [provider]  diltiazem (CARDIZEM CD) 180 MG 24 hr capsule Take 180 mg by mouth daily.    [provider]  docusate calcium (SURFAK) 240 MG capsule Take 240 mg by mouth daily.    [provider]  DULoxetine (CYMBALTA) 60 MG capsule Take 1 capsule (60 mg total) by mouth 2 (two) times daily. Patient taking differently: Take 60 mg by mouth 2 (two) times daily with a meal. 07/21/17   Lavina Hamman, MD  etanercept (ENBREL) 50 MG/ML injection Inject 50 mg into the skin every Wednesday.    [provider]  fluticasone (FLONASE) 50 MCG/ACT nasal spray Place 2 sprays into both nostrils daily.     [provider]  folic acid (FOLVITE) 1 MG tablet Take 1 mg by mouth daily.    [provider]  gabapentin (NEURONTIN) 300 MG capsule Take 900 mg by mouth at bedtime.    [provider]  lisinopril (ZESTRIL) 20 MG tablet Take 20 mg by mouth daily.    [provider]  lurasidone (LATUDA) 40 MG TABS tablet Take 40 mg by mouth daily after supper.    [provider]  Melatonin 3 MG TABS Take 6 mg by mouth at bedtime.    [provider]  metFORMIN (GLUCOPHAGE) 500 MG tablet Take 500 mg by mouth 2 (two) times daily with a meal.    [provider]  methotrexate (RHEUMATREX) 2.5 MG tablet Take 6 tablets (15 mg total) by mouth every Saturday. Hold until seen by rheumatology  Caution:Chemotherapy. Protect from light. Patient taking differently: Take 15 mg by mouth every Saturday. 07/22/17   Lavina Hamman, MD  Multiple Vitamin (MULTIVITAMIN WITH MINERALS) TABS tablet Take 1 tablet by mouth daily.    [provider]  mycophenolate (CELLCEPT) 250 MG capsule Take 1,500 mg by mouth 2 (two) times daily.     [provider]  nitroGLYCERIN (NITROSTAT) 0.4 MG SL tablet Place 1 tablet (0.4 mg total) under the tongue every 5 (five) minutes as needed for chest pain. 07/03/19   Danford, Suann Larry, MD  OVER THE COUNTER MEDICATION Place 1 drop into both ears daily. Miracell ProEar for irritated itchy ears    [provider]  OVER THE COUNTER MEDICATION Take 2 tablets by mouth daily. Pure Zzz    [provider]  Oxcarbazepine (TRILEPTAL) 300 MG tablet Take 600 mg by mouth 3 (three) times daily.    [provider]  polyethylene glycol (MIRALAX / GLYCOLAX) 17 g packet Take 17 g  by mouth daily.    [provider]  PRESCRIPTION MEDICATION Inhale into the lungs See admin instructions. CPAP- At bedtime and during during any naps    [provider]  Propylene Glycol (SYSTANE BALANCE) 0.6 % SOLN Place 1 drop into both eyes in the morning, at noon, and at bedtime.     [provider]  senna-docusate (SENOKOT-S) 8.6-50 MG tablet Take 1 tablet by mouth 2 (two) times daily. 04/07/20   Bonnielee Haff, MD  sodium chloride (OCEAN) 0.65 % SOLN nasal spray Place 2 sprays into both nostrils 4 (four) times daily as needed for congestion.    [provider]  tamsulosin (FLOMAX) 0.4 MG CAPS capsule Take 0.4 mg by mouth daily.    [provider]  Tiotropium Bromide-Olodaterol (STIOLTO RESPIMAT) 2.5-2.5 MCG/ACT AERS Inhale 2 puffs into the lungs daily.    [provider]  traMADol (ULTRAM) 50 MG tablet Take 100 mg by mouth at bedtime.    [provider]  traZODone (DESYREL) 50 MG tablet Take 75 mg by mouth at bedtime.    [provider]  VITAMIN A PO Take 1 tablet by mouth daily.    [provider]  vitamin C (ASCORBIC ACID) 500 MG tablet Take 500 mg by mouth 2 (two) times daily.     [provider]    Allergies    Demerol [meperidine], Zocor [simvastatin], Beet [beta vulgaris], and Liver  Review of  Systems   Review of Systems  Respiratory: Positive for shortness of breath.   Gastrointestinal: Positive for constipation.  All other systems reviewed and are negative.   Physical Exam Updated Vital Signs BP 136/85 (BP Location: Left Arm)   Pulse 98   Temp 98.3 F (36.8 C) (Oral)   Resp 11   Ht _0  (1.88 m)   Wt (!) 149.6 kg   SpO2 90%   BMI 42.34 kg/m   Physical Exam Vitals and nursing note reviewed.  Constitutional:      Appearance: He is well-developed and well-nourished. He is diaphoretic.  HENT:     Head: Normocephalic and atraumatic.  Cardiovascular:     Rate and Rhythm: Regular rhythm. Tachycardia present.     Heart sounds: No murmur heard.   Pulmonary:     Effort: Pulmonary effort is normal. No respiratory distress.     Comments: Decreased air movement bilaterally Abdominal:     Palpations: Abdomen is soft.     Tenderness: There is no abdominal tenderness. There is no guarding or rebound.  Musculoskeletal:        General: No swelling, tenderness or edema.  Skin:    General: Skin is warm.  Neurological:     Mental Status: He is alert and oriented to person, place, and time.  Psychiatric:        Mood and Affect: Mood and affect normal.        Behavior: Behavior normal.     ED Results / Procedures / Treatments   Labs (all labs ordered are listed, but only abnormal results are displayed) Labs Reviewed  COMPREHENSIVE METABOLIC PANEL - Abnormal; Notable for the following components:      Result Value   Glucose, Bld 154 (*)    Creatinine, Ser 1.60 (*)    GFR, Estimated 47 (*)    All other components within normal limits  CBC WITH DIFFERENTIAL/PLATELET - Abnormal; Notable for the following components:   WBC 10.8 (*)    All other components within normal limits  URINALYSIS, ROUTINE W REFLEX MICROSCOPIC - Abnormal; Notable for the following components:   Ketones, ur 5 (*)    All other components within normal limits    EKG EKG  Interpretation  Date/Time:  Tuesday April 14 2020 18:15:43 EST Ventricular Rate:  108 PR Interval:    QRS Duration: 115 QT Interval:  337 QTC Calculation: 452 R Axis:   7 Text Interpretation: Sinus tachycardia Nonspecific intraventricular conduction delay Low voltage, precordial leads Minimal ST elevation, inferior leads Confirmed by Quintella Reichert [phone]) on 04/14/2020 6:21:50 PM   Radiology DG Chest Port 1 View  Result Date: 04/14/2020 CLINICAL DATA:  Shortness of breath EXAM: PORTABLE CHEST 1 VIEW COMPARISON:  04/03/2020 FINDINGS: There is a persistent airspace opacity at the left lung base. There are streaky opacities at the right lung base suggestive of atelectasis or scarring. Emphysematous changes are noted. No pneumothorax. There is a probable small left-sided pleural effusion. The heart size is stable. Aortic calcifications are noted. IMPRESSION: No acute cardiopulmonary process. Chronic findings as detailed above. Electronically Signed   By: Constance Holster M.D.   On: 04/14/2020 18:55    Procedures Procedures   Medications Ordered in ED Medications  sodium chloride 0.9 % bolus 500 mL (500 mLs Intravenous Not Given 04/14/20 2029)  sorbitol, milk of mag, mineral oil, glycerin (SMOG) enema (960 mLs Rectal Given 04/14/20 1946)    ED Course  I have reviewed the triage vital signs and the nursing notes.  Pertinent labs & imaging results that were available during my care of the patient were reviewed by me and considered in my medical decision making (see chart for details).    MDM Rules/Calculators/A&P                         patient with history of COPD here for evaluation of constipation. He was recently admitted for pneumonia and constipation. Overall his breathing is improved since recent hospitalization. Labs with mild elevation is creatinine compared to baseline. He has no significant abdominal tenderness on examination. Chest x-ray with no evidence of acute  pneumonia. He was treated with an enema in the emergency department with production of small amount of stool. Discussed recommendation for additional trial with repeat enema and patient declines. Plan to discharge home with home care for constipation. Discussed outpatient follow-up and return precautions.  Patient's oxygen saturations maintained in the low 90s during his ED stay, this is felt to be at his baseline due to underlying COPD and he does not appear to have an acute exacerbation, PE or pneumonia. He has no respiratory distress in his respirations were at this baseline.  Final Clinical Impression(s) / ED Diagnoses Final diagnoses:  Slow transit constipation    Rx / DC Orders ED Discharge Orders    None       Quintella Reichert, MD 04/14/20 2237

## 2020-04-14 NOTE — ED Notes (Signed)
~  200 cc SMOG enema given rectally at this time. Will monitor for BM.

## 2020-07-22 ENCOUNTER — Encounter (HOSPITAL_COMMUNITY): Payer: Self-pay | Admitting: Emergency Medicine

## 2020-07-22 ENCOUNTER — Emergency Department (HOSPITAL_COMMUNITY): Payer: No Typology Code available for payment source

## 2020-07-22 ENCOUNTER — Other Ambulatory Visit: Payer: Self-pay

## 2020-07-22 ENCOUNTER — Emergency Department (HOSPITAL_COMMUNITY)
Admission: EM | Admit: 2020-07-22 | Discharge: 2020-07-22 | Disposition: A | Discharge status: Home / Self Care | Payer: No Typology Code available for payment source | Attending: Emergency Medicine | Admitting: Emergency Medicine

## 2020-07-22 DIAGNOSIS — Z7951 Long term (current) use of inhaled steroids: Secondary | ICD-10-CM | POA: Insufficient documentation

## 2020-07-22 DIAGNOSIS — R531 Weakness: Secondary | ICD-10-CM | POA: Diagnosis present

## 2020-07-22 DIAGNOSIS — Z87891 Personal history of nicotine dependence: Secondary | ICD-10-CM | POA: Diagnosis not present

## 2020-07-22 DIAGNOSIS — I129 Hypertensive chronic kidney disease with stage 1 through stage 4 chronic kidney disease, or unspecified chronic kidney disease: Secondary | ICD-10-CM | POA: Diagnosis not present

## 2020-07-22 DIAGNOSIS — Z7984 Long term (current) use of oral hypoglycemic drugs: Secondary | ICD-10-CM | POA: Insufficient documentation

## 2020-07-22 DIAGNOSIS — J441 Chronic obstructive pulmonary disease with (acute) exacerbation: Secondary | ICD-10-CM | POA: Diagnosis not present

## 2020-07-22 DIAGNOSIS — E1122 Type 2 diabetes mellitus with diabetic chronic kidney disease: Secondary | ICD-10-CM | POA: Insufficient documentation

## 2020-07-22 DIAGNOSIS — N1831 Chronic kidney disease, stage 3a: Secondary | ICD-10-CM | POA: Insufficient documentation

## 2020-07-22 DIAGNOSIS — M791 Myalgia, unspecified site: Secondary | ICD-10-CM | POA: Diagnosis not present

## 2020-07-22 DIAGNOSIS — Z79899 Other long term (current) drug therapy: Secondary | ICD-10-CM | POA: Insufficient documentation

## 2020-07-22 DIAGNOSIS — R0602 Shortness of breath: Secondary | ICD-10-CM | POA: Insufficient documentation

## 2020-07-22 DIAGNOSIS — E860 Dehydration: Secondary | ICD-10-CM | POA: Diagnosis not present

## 2020-07-22 LAB — COMPREHENSIVE METABOLIC PANEL
ALT: 39 U/L (ref 0–44)
AST: 36 U/L (ref 15–41)
Albumin: 4.3 g/dL (ref 3.5–5.0)
Alkaline Phosphatase: 52 U/L (ref 38–126)
Anion gap: 10 (ref 5–15)
BUN: 15 mg/dL (ref 8–23)
CO2: 27 mmol/L (ref 22–32)
Calcium: 9.1 mg/dL (ref 8.9–10.3)
Chloride: 98 mmol/L (ref 98–111)
Creatinine, Ser: 1.06 mg/dL (ref 0.61–1.24)
GFR, Estimated: >60 mL/min (ref >60)
Glucose, Bld: 108 mg/dL — ABNORMAL HIGH (ref 70–99)
Potassium: 4.4 mmol/L (ref 3.5–5.1)
Sodium: 135 mmol/L (ref 135–145)
Total Bilirubin: 0.5 mg/dL (ref 0.3–1.2)
Total Protein: 7.3 g/dL (ref 6.5–8.1)

## 2020-07-22 LAB — CBC WITH DIFFERENTIAL/PLATELET
Abs Immature Granulocytes: 0.05 10*3/uL (ref 0.00–0.07)
Basophils Absolute: 0 10*3/uL (ref 0.0–0.1)
Basophils Relative: 0 %
Eosinophils Absolute: 0.2 10*3/uL (ref 0.0–0.5)
Eosinophils Relative: 1 %
HCT: 44.8 % (ref 39.0–52.0)
Hemoglobin: 15 g/dL (ref 13.0–17.0)
Immature Granulocytes: 0 %
Lymphocytes Relative: 23 %
Lymphs Abs: 2.6 10*3/uL (ref 0.7–4.0)
MCH: 32.6 pg (ref 26.0–34.0)
MCHC: 33.5 g/dL (ref 30.0–36.0)
MCV: 97.4 fL (ref 80.0–100.0)
Monocytes Absolute: 0.9 10*3/uL (ref 0.1–1.0)
Monocytes Relative: 8 %
Neutro Abs: 7.8 10*3/uL — ABNORMAL HIGH (ref 1.7–7.7)
Neutrophils Relative %: 68 %
Platelets: 270 10*3/uL (ref 150–400)
RBC: 4.6 MIL/uL (ref 4.22–5.81)
RDW: 13.2 % (ref 11.5–15.5)
WBC: 11.5 10*3/uL — ABNORMAL HIGH (ref 4.0–10.5)
nRBC: 0 % (ref 0.0–0.2)

## 2020-07-22 LAB — URINALYSIS, ROUTINE W REFLEX MICROSCOPIC
Bilirubin Urine: NEGATIVE
Glucose, UA: NEGATIVE mg/dL
Hgb urine dipstick: NEGATIVE
Ketones, ur: 5 mg/dL — AB
Leukocytes,Ua: NEGATIVE
Nitrite: NEGATIVE
Protein, ur: NEGATIVE mg/dL
Specific Gravity, Urine: 1.02 (ref 1.005–1.030)
pH: 7 (ref 5.0–8.0)

## 2020-07-22 MED ORDER — SODIUM CHLORIDE 0.9 % IV BOLUS
1000.0000 mL | Freq: Once | INTRAVENOUS | Status: AC
  Administered 2020-07-22: 1000 mL via INTRAVENOUS

## 2020-07-22 MED ORDER — OXYCODONE-ACETAMINOPHEN 5-325 MG PO TABS
1.0000 | ORAL_TABLET | Freq: Once | ORAL | Status: AC
  Administered 2020-07-22: 1 via ORAL
  Filled 2020-07-22: qty 1

## 2020-07-22 NOTE — Discharge Instructions (Addendum)
Drink plenty of fluids.  Take your albuterol inhaler if necessary.  Follow-up with your family doctor if not improving

## 2020-07-22 NOTE — ED Provider Notes (Signed)
[facility] DEPT Provider Note   CSN: [id number] Arrival date & time: 07/22/20  1515     History Chief Complaint  Patient presents with  . Shortness of Breath  . Weakness    Mario [name] is a 70 y.o. male.  Patient states that he got his shingles shot the other day and now he complains of weakness and myalgias.  He also states he has some shortness of breath.  He uses O2 at home for his COPD.  The history is provided by the patient and medical records.  Weakness Severity:  Mild Onset quality:  Sudden Timing:  Constant Chronicity:  New Context: not alcohol use   Relieved by:  Nothing Associated symptoms: myalgias   Associated symptoms: no abdominal pain, no chest pain, no cough, no diarrhea, no frequency, no headaches and no seizures        Past Medical History:  Diagnosis Date  . Anxiety   . Bronchitis   . COPD (chronic obstructive pulmonary disease) ([place])   . Depression   . Hypertension   . Mental disorder   . MI (myocardial infarction) ([place])    ????  . OSA (obstructive sleep apnea)   . Suicide attempt ([place])   . Tension pneumothorax 06/27/2016  . Uveitis     Patient Active Problem List   Diagnosis Date Noted  . Drug-induced constipation   . CAP (community acquired pneumonia) 04/03/2020  . Acute exacerbation of chronic obstructive pulmonary disease (COPD) ([place]) 01/05/2020  . Vertigo 12/26/2019  . 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 Stage 3a chronic kidney disease ([place]) 12/26/2019  . Abnormal CT of the chest 12/26/2019  . COPD (chronic obstructive pulmonary disease) ([place])   . AKI (acute kidney injury) ([place])   . Hyperglycemia 06/23/2019  . OSA (obstructive sleep apnea) 06/23/2019  . Suicidal ideation 06/23/2019  . Major depressive disorder, recurrent episode ([place]) 10/12/2018  . 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 Cellulitis of both feet 07/16/2017  . DM2 (diabetes mellitus,  type 2) ([place]) 07/16/2017  . HLA B27 (HLA B27 positive) 07/16/2017  . Acute chest pain   . Hyperkalemia   . Spontaneous pneumothorax 06/27/2016  . Hypoxia   . Pneumothorax on left   . Dyspnea 05/23/2014  . COPD exacerbation ([place]) 05/23/2014  . Malingering 01/21/2013  . Depression 01/11/2013  . Tobacco abuse 01/11/2013  . Obesity, unspecified 01/11/2013  . Chest pain 01/10/2013  . HTN (hypertension) 01/10/2013    Past Surgical History:  Procedure Laterality Date  . CHEST TUBE INSERTION Left 06/27/2016  . cryptorchidism    . SKIN CANCER EXCISION         Family History  Problem Relation Age of Onset  . Dementia Father     Social History   Tobacco Use  . Smoking status: Former Smoker    Packs/day: 1.00    Years: 35.00    Pack years: 35.00    Types: Cigarettes    Quit date: 05/2016    Years since quitting: 4.1  . Smokeless tobacco: Never Used  Vaping Use  . Vaping Use: Never used  Substance Use Topics  . Alcohol use: No    Alcohol/week: 0.0 standard drinks    Comment: denies use of any drugs or alcohol  . Drug use: No    Home Medications Prior to Admission medications   Medication Sig Start Date End Date Taking? Authorizing Provider  acetaminophen (TYLENOL) 500 MG tablet Take 1,000  mg by mouth every 6 (six) hours as needed for moderate pain.    [provider]  b complex vitamins capsule Take 1 capsule by mouth daily.    [provider]  busPIRone (BUSPAR) 15 MG tablet Take 15 mg by mouth daily.     [provider]  calcium-vitamin D (OSCAL WITH D) 500-200 MG-UNIT tablet Take 1 tablet by mouth 2 (two) times daily with a meal.    [provider]  Cholecalciferol (VITAMIN D-3) 25 MCG (1000 UT) CAPS Take 1,000 Units by mouth daily.    [provider]  cyclobenzaprine (FLEXERIL) 10 MG tablet Take 10 mg by mouth at bedtime.    [provider]  diltiazem (CARDIZEM CD) 180 MG 24 hr capsule Take 180 mg by mouth daily.     [provider]  docusate calcium (SURFAK) 240 MG capsule Take 240 mg by mouth daily.    [provider]  DULoxetine (CYMBALTA) 60 MG capsule Take 1 capsule (60 mg total) by mouth 2 (two) times daily. Patient taking differently: Take 60 mg by mouth 2 (two) times daily with a meal. 07/21/17   Lavina Hamman, MD  etanercept (ENBREL) 50 MG/ML injection Inject 50 mg into the skin every Wednesday.    [provider]  fluticasone (FLONASE) 50 MCG/ACT nasal spray Place 2 sprays into both nostrils daily.     [provider]  folic acid (FOLVITE) 1 MG tablet Take 1 mg by mouth daily.    [provider]  gabapentin (NEURONTIN) 300 MG capsule Take 900 mg by mouth at bedtime.    [provider]  lisinopril (ZESTRIL) 20 MG tablet Take 20 mg by mouth daily.    [provider]  lurasidone (LATUDA) 40 MG TABS tablet Take 40 mg by mouth daily after supper.    [provider]  Melatonin 3 MG TABS Take 6 mg by mouth at bedtime.    [provider]  metFORMIN (GLUCOPHAGE) 500 MG tablet Take 500 mg by mouth 2 (two) times daily with a meal.    [provider]  methotrexate (RHEUMATREX) 2.5 MG tablet Take 6 tablets (15 mg total) by mouth every Saturday. Hold until seen by rheumatology  Caution:Chemotherapy. Protect from light. Patient taking differently: Take 15 mg by mouth every Saturday. 07/22/17   Lavina Hamman, MD  Multiple Vitamin (MULTIVITAMIN WITH MINERALS) TABS tablet Take 1 tablet by mouth daily.    [provider]  mycophenolate (CELLCEPT) 250 MG capsule Take 1,500 mg by mouth 2 (two) times daily.    [provider]  nitroGLYCERIN (NITROSTAT) 0.4 MG SL tablet Place 1 tablet (0.4 mg total) under the tongue every 5 (five) minutes as needed for chest pain. 07/03/19   Danford, Suann Larry, MD  OVER THE COUNTER MEDICATION Place 1 drop into both ears daily. Miracell ProEar for irritated itchy ears     [provider]  OVER THE COUNTER MEDICATION Take 2 tablets by mouth daily. Pure Zzz    [provider]  Oxcarbazepine (TRILEPTAL) 300 MG tablet Take 600 mg by mouth 3 (three) times daily.    [provider]  polyethylene glycol (MIRALAX / GLYCOLAX) 17 g packet Take 17 g by mouth daily.    [provider]  PRESCRIPTION MEDICATION Inhale into the lungs See admin instructions. CPAP- At bedtime and during during any naps    [provider]  Propylene Glycol (SYSTANE BALANCE) 0.6 % SOLN Place 1 drop into  both eyes in the morning, at noon, and at bedtime.     [provider]  senna-docusate (SENOKOT-S) 8.6-50 MG tablet Take 1 tablet by mouth 2 (two) times daily. 04/07/20   Bonnielee Haff, MD  sodium chloride (OCEAN) 0.65 % SOLN nasal spray Place 2 sprays into both nostrils 4 (four) times daily as needed for congestion.    [provider]  tamsulosin (FLOMAX) 0.4 MG CAPS capsule Take 0.4 mg by mouth daily.    [provider]  Tiotropium Bromide-Olodaterol (STIOLTO RESPIMAT) 2.5-2.5 MCG/ACT AERS Inhale 2 puffs into the lungs daily.    [provider]  traMADol (ULTRAM) 50 MG tablet Take 100 mg by mouth at bedtime.    [provider]  traZODone (DESYREL) 50 MG tablet Take 75 mg by mouth at bedtime.    [provider]  VITAMIN A PO Take 1 tablet by mouth daily.    [provider]  vitamin C (ASCORBIC ACID) 500 MG tablet Take 500 mg by mouth 2 (two) times daily.     [provider]    Allergies    Demerol [meperidine], Zocor [simvastatin], Beet [beta vulgaris], and Liver  Review of Systems   Review of Systems  Constitutional: Negative for appetite change and fatigue.  HENT: Negative for congestion, ear discharge and sinus pressure.   Eyes: Negative for discharge.  Respiratory: Negative for cough.   Cardiovascular: Negative for chest pain.  Gastrointestinal: Negative for abdominal pain  and diarrhea.  Genitourinary: Negative for frequency and hematuria.  Musculoskeletal: Positive for myalgias. Negative for back pain.  Skin: Negative for rash.  Neurological: Positive for weakness. Negative for seizures and headaches.  Psychiatric/Behavioral: Negative for hallucinations.    Physical Exam Updated Vital Signs BP 110/69   Pulse 91   Temp 98.3 F (36.8 C) (Oral)   Resp 16   SpO2 92%   Physical Exam Vitals and nursing note reviewed.  Constitutional:      Appearance: He is well-developed.  HENT:     Head: Normocephalic.     Nose: Nose normal.  Eyes:     General: No scleral icterus.    Conjunctiva/sclera: Conjunctivae normal.  Neck:     Thyroid: No thyromegaly.  Cardiovascular:     Rate and Rhythm: Normal rate and regular rhythm.     Heart sounds: No murmur heard. No friction rub. No gallop.   Pulmonary:     Breath sounds: No stridor. No wheezing or rales.  Chest:     Chest wall: No tenderness.  Abdominal:     General: There is no distension.     Tenderness: There is no abdominal tenderness. There is no rebound.  Musculoskeletal:        General: Normal range of motion.     Cervical back: Neck supple.  Lymphadenopathy:     Cervical: No cervical adenopathy.  Skin:    Findings: No erythema or rash.  Neurological:     Mental Status: He is alert and oriented to person, place, and time.     Motor: No abnormal muscle tone.     Coordination: Coordination normal.  Psychiatric:        Behavior: Behavior normal.     ED Results / Procedures / Treatments   Labs (all labs ordered are listed, but only abnormal results are displayed) Labs Reviewed  CBC WITH DIFFERENTIAL/PLATELET - Abnormal; Notable for the following components:      Result Value   WBC 11.5 (*)    Neutro  Abs 7.8 (*)    All other components within normal limits  COMPREHENSIVE METABOLIC PANEL - Abnormal; Notable for the following components:   Glucose, Bld 108 (*)    All other components  within normal limits  URINALYSIS, ROUTINE W REFLEX MICROSCOPIC - Abnormal; Notable for the following components:   APPearance CLOUDY (*)    Ketones, ur 5 (*)    All other components within normal limits    EKG None  Radiology DG Chest 2 View  Result Date: 07/22/2020 CLINICAL DATA:  Shortness of breath EXAM: CHEST - 2 VIEW COMPARISON:  04/14/2020 FINDINGS: Cardiac shadow is stable. The lungs are well aerated bilaterally. Bibasilar airspace opacity is noted most consistent with atelectasis. No sizable effusion is seen. Degenerative changes of the thoracic spine are noted. IMPRESSION: Changes of bibasilar atelectasis. Electronically Signed   By: Inez Catalina M.D.   On: 07/22/2020 16:58   DG Lumbar Spine Complete  Result Date: 07/22/2020 CLINICAL DATA:  Low back pain, no known injury, initial encounter EXAM: LUMBAR SPINE - COMPLETE 4+ VIEW COMPARISON:  04/03/2020 FINDINGS: Four non rib-bearing lumbar type vertebral bodies are well visualized. Fifth lumbar vertebra is partially sacralized. Significant disc space narrowing is noted at L4-5. Osteophytic changes are noted throughout the lumbar spine. No anterolisthesis is seen. No pars defects are noted. No soft tissue abnormality is seen. IMPRESSION: Degenerative change without acute abnormality. Electronically Signed   By: Inez Catalina M.D.   On: 07/22/2020 17:01   CT Head Wo Contrast  Result Date: 07/22/2020 CLINICAL DATA:  Weakness. EXAM: CT HEAD WITHOUT CONTRAST TECHNIQUE: Contiguous axial images were obtained from the base of the skull through the vertex without intravenous contrast. COMPARISON:  March 21, 2020. FINDINGS: Brain: No evidence of acute infarction, hemorrhage, hydrocephalus, extra-axial collection or mass lesion/mass effect. Vascular: No hyperdense vessel or unexpected calcification. Skull: Normal. Negative for fracture or focal lesion. Sinuses/Orbits: No acute finding. Other: None. IMPRESSION: No acute intracranial abnormality  seen. Electronically Signed   By: Marijo Conception M.D.   On: 07/22/2020 18:52    Procedures Procedures   Medications Ordered in ED Medications  oxyCODONE-acetaminophen (PERCOCET/ROXICET) 5-325 MG per tablet 1 tablet (1 tablet Oral Given 07/22/20 1609)  sodium chloride 0.9 % bolus 1,000 mL (1,000 mLs Intravenous New Bag/Given (Non-Interop) 07/22/20 1830)    ED Course  I have reviewed the triage vital signs and the nursing notes.  Pertinent labs & imaging results that were available during my care of the patient were reviewed by me and considered in my medical decision making (see chart for details). Patient improved with IV fluids and a Percocet.  His O2 sats have remained in the mid 90s on his 2 L.     MDM Rules/Calculators/A&P                         Patient with myalgias and dehydration.  Myalgias could be related to the recent shingles injection.  He improved with fluids and a Percocet.  He will follow-up with his PCP Final Clinical Impression(s) / ED Diagnoses Final diagnoses:  Dehydration  Myalgia    Rx / DC Orders ED Discharge Orders    None       Milton Ferguson, MD 07/22/20 1946

## 2020-07-22 NOTE — ED Triage Notes (Signed)
Pt BIB EMS from home c/o weakness, lightheadedness, and shob. Hx of COPD. Pt concerned he's having side effects of the shingles vaccine he received yesterday. 92% on 2LNC. O2 at home PRN.

## 2020-07-25 ENCOUNTER — Emergency Department (HOSPITAL_COMMUNITY): Payer: Medicare Other

## 2020-07-25 ENCOUNTER — Encounter (HOSPITAL_COMMUNITY): Payer: Self-pay | Admitting: Emergency Medicine

## 2020-07-25 ENCOUNTER — Inpatient Hospital Stay (HOSPITAL_COMMUNITY)
Admission: EM | Admit: 2020-07-25 | Discharge: 2020-08-05 | DRG: 166 | Disposition: A | Discharge status: Home / Self Care | Payer: Medicare Other | Attending: Internal Medicine | Admitting: Internal Medicine

## 2020-07-25 ENCOUNTER — Other Ambulatory Visit: Payer: Self-pay

## 2020-07-25 DIAGNOSIS — I152 Hypertension secondary to endocrine disorders: Secondary | ICD-10-CM | POA: Diagnosis present

## 2020-07-25 DIAGNOSIS — E1159 Type 2 diabetes mellitus with other circulatory complications: Secondary | ICD-10-CM | POA: Diagnosis present

## 2020-07-25 DIAGNOSIS — Z91018 Allergy to other foods: Secondary | ICD-10-CM

## 2020-07-25 DIAGNOSIS — C3490 Malignant neoplasm of unspecified part of unspecified bronchus or lung: Secondary | ICD-10-CM | POA: Diagnosis present

## 2020-07-25 DIAGNOSIS — E871 Hypo-osmolality and hyponatremia: Secondary | ICD-10-CM | POA: Diagnosis not present

## 2020-07-25 DIAGNOSIS — Z79899 Other long term (current) drug therapy: Secondary | ICD-10-CM

## 2020-07-25 DIAGNOSIS — J441 Chronic obstructive pulmonary disease with (acute) exacerbation: Principal | ICD-10-CM | POA: Diagnosis present

## 2020-07-25 DIAGNOSIS — Z7984 Long term (current) use of oral hypoglycemic drugs: Secondary | ICD-10-CM

## 2020-07-25 DIAGNOSIS — E785 Hyperlipidemia, unspecified: Secondary | ICD-10-CM

## 2020-07-25 DIAGNOSIS — G8929 Other chronic pain: Secondary | ICD-10-CM | POA: Diagnosis present

## 2020-07-25 DIAGNOSIS — Z9151 Personal history of suicidal behavior: Secondary | ICD-10-CM

## 2020-07-25 DIAGNOSIS — Z20822 Contact with and (suspected) exposure to covid-19: Secondary | ICD-10-CM | POA: Diagnosis present

## 2020-07-25 DIAGNOSIS — F32A Depression, unspecified: Secondary | ICD-10-CM | POA: Diagnosis present

## 2020-07-25 DIAGNOSIS — Z87891 Personal history of nicotine dependence: Secondary | ICD-10-CM

## 2020-07-25 DIAGNOSIS — G4733 Obstructive sleep apnea (adult) (pediatric): Secondary | ICD-10-CM | POA: Diagnosis present

## 2020-07-25 DIAGNOSIS — F419 Anxiety disorder, unspecified: Secondary | ICD-10-CM | POA: Diagnosis present

## 2020-07-25 DIAGNOSIS — R519 Headache, unspecified: Secondary | ICD-10-CM | POA: Diagnosis not present

## 2020-07-25 DIAGNOSIS — J9601 Acute respiratory failure with hypoxia: Secondary | ICD-10-CM | POA: Diagnosis present

## 2020-07-25 DIAGNOSIS — I252 Old myocardial infarction: Secondary | ICD-10-CM

## 2020-07-25 DIAGNOSIS — Z888 Allergy status to other drugs, medicaments and biological substances status: Secondary | ICD-10-CM

## 2020-07-25 DIAGNOSIS — K5900 Constipation, unspecified: Secondary | ICD-10-CM

## 2020-07-25 DIAGNOSIS — Z85828 Personal history of other malignant neoplasm of skin: Secondary | ICD-10-CM

## 2020-07-25 DIAGNOSIS — E119 Type 2 diabetes mellitus without complications: Secondary | ICD-10-CM

## 2020-07-25 DIAGNOSIS — I10 Essential (primary) hypertension: Secondary | ICD-10-CM | POA: Diagnosis present

## 2020-07-25 DIAGNOSIS — J9621 Acute and chronic respiratory failure with hypoxia: Secondary | ICD-10-CM | POA: Diagnosis present

## 2020-07-25 DIAGNOSIS — T380X5A Adverse effect of glucocorticoids and synthetic analogues, initial encounter: Secondary | ICD-10-CM | POA: Diagnosis not present

## 2020-07-25 DIAGNOSIS — R0602 Shortness of breath: Secondary | ICD-10-CM

## 2020-07-25 DIAGNOSIS — E1165 Type 2 diabetes mellitus with hyperglycemia: Secondary | ICD-10-CM | POA: Diagnosis present

## 2020-07-25 DIAGNOSIS — R918 Other nonspecific abnormal finding of lung field: Secondary | ICD-10-CM

## 2020-07-25 DIAGNOSIS — Z419 Encounter for procedure for purposes other than remedying health state, unspecified: Secondary | ICD-10-CM

## 2020-07-25 DIAGNOSIS — E1169 Type 2 diabetes mellitus with other specified complication: Secondary | ICD-10-CM

## 2020-07-25 DIAGNOSIS — Z6841 Body Mass Index (BMI) 40.0 and over, adult: Secondary | ICD-10-CM

## 2020-07-25 DIAGNOSIS — M542 Cervicalgia: Secondary | ICD-10-CM | POA: Diagnosis present

## 2020-07-25 DIAGNOSIS — R911 Solitary pulmonary nodule: Secondary | ICD-10-CM | POA: Diagnosis present

## 2020-07-25 DIAGNOSIS — Z885 Allergy status to narcotic agent status: Secondary | ICD-10-CM

## 2020-07-25 DIAGNOSIS — Z9889 Other specified postprocedural states: Secondary | ICD-10-CM

## 2020-07-25 DIAGNOSIS — M545 Low back pain, unspecified: Secondary | ICD-10-CM | POA: Diagnosis present

## 2020-07-25 LAB — URINALYSIS, ROUTINE W REFLEX MICROSCOPIC
Bilirubin Urine: NEGATIVE
Glucose, UA: NEGATIVE mg/dL
Hgb urine dipstick: NEGATIVE
Ketones, ur: NEGATIVE mg/dL
Leukocytes,Ua: NEGATIVE
Nitrite: NEGATIVE
Protein, ur: NEGATIVE mg/dL
Specific Gravity, Urine: 1.02 (ref 1.005–1.030)
pH: 7 (ref 5.0–8.0)

## 2020-07-25 LAB — CBC WITH DIFFERENTIAL/PLATELET
Abs Immature Granulocytes: 0.04 10*3/uL (ref 0.00–0.07)
Basophils Absolute: 0.1 10*3/uL (ref 0.0–0.1)
Basophils Relative: 1 %
Eosinophils Absolute: 0.2 10*3/uL (ref 0.0–0.5)
Eosinophils Relative: 1 %
HCT: 45 % (ref 39.0–52.0)
Hemoglobin: 15.1 g/dL (ref 13.0–17.0)
Immature Granulocytes: 0 %
Lymphocytes Relative: 30 %
Lymphs Abs: 3.5 10*3/uL (ref 0.7–4.0)
MCH: 33.1 pg (ref 26.0–34.0)
MCHC: 33.6 g/dL (ref 30.0–36.0)
MCV: 98.7 fL (ref 80.0–100.0)
Monocytes Absolute: 1.3 10*3/uL — ABNORMAL HIGH (ref 0.1–1.0)
Monocytes Relative: 11 %
Neutro Abs: 6.9 10*3/uL (ref 1.7–7.7)
Neutrophils Relative %: 57 %
Platelets: 276 10*3/uL (ref 150–400)
RBC: 4.56 MIL/uL (ref 4.22–5.81)
RDW: 13.3 % (ref 11.5–15.5)
WBC: 12 10*3/uL — ABNORMAL HIGH (ref 4.0–10.5)
nRBC: 0 % (ref 0.0–0.2)

## 2020-07-25 LAB — COMPREHENSIVE METABOLIC PANEL
ALT: 43 U/L (ref 0–44)
AST: 34 U/L (ref 15–41)
Albumin: 4.3 g/dL (ref 3.5–5.0)
Alkaline Phosphatase: 51 U/L (ref 38–126)
Anion gap: 11 (ref 5–15)
BUN: 15 mg/dL (ref 8–23)
CO2: 23 mmol/L (ref 22–32)
Calcium: 9.3 mg/dL (ref 8.9–10.3)
Chloride: 101 mmol/L (ref 98–111)
Creatinine, Ser: 1.05 mg/dL (ref 0.61–1.24)
GFR, Estimated: >60 mL/min (ref >60)
Glucose, Bld: 111 mg/dL — ABNORMAL HIGH (ref 70–99)
Potassium: 4.5 mmol/L (ref 3.5–5.1)
Sodium: 135 mmol/L (ref 135–145)
Total Bilirubin: 0.4 mg/dL (ref 0.3–1.2)
Total Protein: 7.5 g/dL (ref 6.5–8.1)

## 2020-07-25 LAB — LIPASE, BLOOD: Lipase: 33 U/L (ref 11–51)

## 2020-07-25 NOTE — ED Notes (Signed)
Blood drawn, full rainbow and dark green sent

## 2020-07-25 NOTE — ED Triage Notes (Signed)
Per EMS, patient from home, reports constipation for ten days. States last BM was ten days ago. EMS reports supplies to prep for colonoscopy visible at the home. Hx COPD.

## 2020-07-25 NOTE — ED Provider Notes (Addendum)
Emergency Medicine Provider Triage Evaluation Note  Mario [name] , a 70 y.o. male  was evaluated in triage.  Pt complains of constipation that has been present for 2 weeks. States he has not had a bm in 11 days. He has tried taking miralax, docusate and mag citrate and he has still not had any output. He is c/o lower abd pain. He denies nv.  States he got his shingles shot a few days ago and since then he has felt very weak  Review of Systems  Positive: Constipation, abd pain Negative: nv  Physical Exam  BP (!) 165/83 (BP Location: Right Arm)   Pulse (!) 110   Temp 98.1 F (36.7 C) (Oral)   Resp 20   Ht 6\' 2"  (1.88 m)   Wt (!) 154.2 kg   SpO2 91%   BMI 43.65 kg/m  Gen:   Awake, no distress   Resp:  Normal effort MSK:   Moves extremities without difficulty  Other:  Luq abd ttp  Medical Decision Making  Medically screening exam initiated at 8:29 PM.  Appropriate orders placed.  Mario [name] was informed that the remainder of the evaluation will be completed by another provider, this initial triage assessment does not replace that evaluation, and the importance of remaining in the ED until their evaluation is complete.    Rodney Booze, PA-C 07/25/20 2032    Rodney Booze, PA-C 07/25/20 2034    Dorie Rank, MD 07/25/20 2154

## 2020-07-25 NOTE — ED Provider Notes (Addendum)
Received the patient in signout from Dr. Joya Gaskins, briefly the patient is a 70 year old male here with a chief complaints of constipation.  Found to be tachypneic and tachycardic.  CT scan of the abdomen pelvis concerning for a lung mass.  Plan for further laboratory evaluation, if unremarkable will attempt to ambulate the patient and see if we can discharge him home.   Patient hypoxic into the 70s with minimal ambulation on his 2L of O2 at home. Will CT, discuss with hospitalist for admission.  No obvious wheezing on exam we will try 2 DuoNeb's.  After discussion with the hospitalist we will start the patient on heparin as well.  CRITICAL CARE Performed by: Cecilio Asper   Total critical care time: 35 minutes  Critical care time was exclusive of separately billable procedures and treating other patients.  Critical care was necessary to treat or prevent imminent or life-threatening deterioration.  Critical care was time spent personally by me on the following activities: development of treatment plan with patient and/or surrogate as well as nursing, discussions with consultants, evaluation of patient's response to treatment, examination of patient, obtaining history from patient or surrogate, ordering and performing treatments and interventions, ordering and review of laboratory studies, ordering and review of radiographic studies, pulse oximetry and re-evaluation of patient's condition.    Deno Etienne, DO 07/26/20 [place], [place], DO 07/26/20 [phone]

## 2020-07-25 NOTE — ED Provider Notes (Signed)
[facility] DEPT Provider Note   CSN: [id number] Arrival date & time: 07/25/20  1939     History Chief Complaint  Patient presents with  . Constipation    Mario [name] is a 70 y.o. male.  He has also had increased shortness of breath and weakness for several days.  He was seen here in the ED recently, and had a chest x-ray that was negative for acute findings.  He attributes some of this weakness to receiving the shingles vaccine.  He does have home oxygen which he uses with his CPAP machine and when he goes out in public.  However, he does not typically use oxygen continuously.  The history is provided by the patient.  Constipation Severity:  Severe Time since last bowel movement:  10 days Timing:  Constant Progression:  Unchanged Chronicity:  Recurrent Context comment:  He did not have his MiraLAX for several days, and when he resumed taking it, he developed constipation despite his resumption of this medication. Stool description:  None produced Relieved by:  Nothing Worsened by:  Nothing Ineffective treatments:  Miralax and laxatives Associated symptoms: abdominal pain   Associated symptoms: no back pain, no dysuria, no fever and no vomiting        Past Medical History:  Diagnosis Date  . Anxiety   . Bronchitis   . COPD (chronic obstructive pulmonary disease) ([place])   . Depression   . Hypertension   . Mental disorder   . MI (myocardial infarction) ([place])    ????  . OSA (obstructive sleep apnea)   . Suicide attempt ([place])   . Tension pneumothorax 06/27/2016  . Uveitis     Patient Active Problem List   Diagnosis Date Noted  . Drug-induced constipation   . CAP (community acquired pneumonia) 04/03/2020  . Acute exacerbation of chronic obstructive pulmonary disease (COPD) ([place]) 01/05/2020  . Vertigo 12/26/2019  . 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 Stage 3a chronic kidney disease ([place]) 12/26/2019  . Abnormal CT of the chest  12/26/2019  . COPD (chronic obstructive pulmonary disease) ([place])   . AKI (acute kidney injury) ([place])   . Hyperglycemia 06/23/2019  . OSA (obstructive sleep apnea) 06/23/2019  . Suicidal ideation 06/23/2019  . Major depressive disorder, recurrent episode ([place]) 10/12/2018  . 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 Cellulitis of both feet 07/16/2017  . DM2 (diabetes mellitus, type 2) ([place]) 07/16/2017  . HLA B27 (HLA B27 positive) 07/16/2017  . Acute chest pain   . Hyperkalemia   . Spontaneous pneumothorax 06/27/2016  . Hypoxia   . Pneumothorax on left   . Dyspnea 05/23/2014  . COPD exacerbation ([place]) 05/23/2014  . Malingering 01/21/2013  . Depression 01/11/2013  . Tobacco abuse 01/11/2013  . Obesity, unspecified 01/11/2013  . Chest pain 01/10/2013  . HTN (hypertension) 01/10/2013    Past Surgical History:  Procedure Laterality Date  . CHEST TUBE INSERTION Left 06/27/2016  . cryptorchidism    . SKIN CANCER EXCISION         Family History  Problem Relation Age of Onset  . Dementia Father     Social History   Tobacco Use  . Smoking status: Former Smoker    Packs/day: 1.00    Years: 35.00    Pack years: 35.00    Types: Cigarettes    Quit date: 05/2016    Years since quitting: 4.1  . Smokeless tobacco: Never Used  Vaping Use  .  Vaping Use: Never used  Substance Use Topics  . Alcohol use: No    Alcohol/week: 0.0 standard drinks    Comment: denies use of any drugs or alcohol  . Drug use: No    Home Medications Prior to Admission medications   Medication Sig Start Date End Date Taking? Authorizing Provider  acetaminophen (TYLENOL) 500 MG tablet Take 1,000 mg by mouth every 6 (six) hours as needed for moderate pain.    [provider]  b complex vitamins capsule Take 1 capsule by mouth daily.    [provider]  busPIRone (BUSPAR) 15 MG tablet Take 15 mg by mouth daily.     [provider]   calcium-vitamin D (OSCAL WITH D) 500-200 MG-UNIT tablet Take 1 tablet by mouth 2 (two) times daily with a meal.    [provider]  Cholecalciferol (VITAMIN D-3) 25 MCG (1000 UT) CAPS Take 1,000 Units by mouth daily.    [provider]  cyclobenzaprine (FLEXERIL) 10 MG tablet Take 10 mg by mouth at bedtime.    [provider]  diltiazem (CARDIZEM CD) 180 MG 24 hr capsule Take 180 mg by mouth daily.    [provider]  docusate calcium (SURFAK) 240 MG capsule Take 240 mg by mouth daily.    [provider]  DULoxetine (CYMBALTA) 60 MG capsule Take 1 capsule (60 mg total) by mouth 2 (two) times daily. Patient taking differently: Take 60 mg by mouth 2 (two) times daily with a meal. 07/21/17   Lavina Hamman, MD  etanercept (ENBREL) 50 MG/ML injection Inject 50 mg into the skin every Wednesday.    [provider]  fluticasone (FLONASE) 50 MCG/ACT nasal spray Place 2 sprays into both nostrils daily.     [provider]  folic acid (FOLVITE) 1 MG tablet Take 1 mg by mouth daily.    [provider]  gabapentin (NEURONTIN) 300 MG capsule Take 900 mg by mouth at bedtime.    [provider]  lisinopril (ZESTRIL) 20 MG tablet Take 20 mg by mouth daily.    [provider]  lurasidone (LATUDA) 40 MG TABS tablet Take 40 mg by mouth daily after supper.    [provider]  Melatonin 3 MG TABS Take 6 mg by mouth at bedtime.    [provider]  metFORMIN (GLUCOPHAGE) 500 MG tablet Take 500 mg by mouth 2 (two) times daily with a meal.    [provider]  methotrexate (RHEUMATREX) 2.5 MG tablet Take 6 tablets (15 mg total) by mouth every Saturday. Hold until seen by rheumatology  Caution:Chemotherapy. Protect from light. Patient taking differently: Take 15 mg by mouth every Saturday. 07/22/17   Lavina Hamman, MD  Multiple Vitamin (MULTIVITAMIN WITH MINERALS) TABS tablet Take 1 tablet by mouth daily.     [provider]  mycophenolate (CELLCEPT) 250 MG capsule Take 1,500 mg by mouth 2 (two) times daily.    [provider]  nitroGLYCERIN (NITROSTAT) 0.4 MG SL tablet Place 1 tablet (0.4 mg total) under the tongue every 5 (five) minutes as needed for chest pain. 07/03/19   Danford, Suann Larry, MD  OVER THE COUNTER MEDICATION Place 1 drop into both ears daily. Miracell ProEar for irritated itchy ears    [provider]  OVER THE COUNTER MEDICATION Take 2 tablets by mouth daily. Pure Zzz    [provider]  Oxcarbazepine (TRILEPTAL) 300 MG tablet Take 600 mg by mouth 3 (three) times  daily.    [provider]  polyethylene glycol (MIRALAX / GLYCOLAX) 17 g packet Take 17 g by mouth daily.    [provider]  PRESCRIPTION MEDICATION Inhale into the lungs See admin instructions. CPAP- At bedtime and during during any naps    [provider]  Propylene Glycol (SYSTANE BALANCE) 0.6 % SOLN Place 1 drop into both eyes in the morning, at noon, and at bedtime.     [provider]  senna-docusate (SENOKOT-S) 8.6-50 MG tablet Take 1 tablet by mouth 2 (two) times daily. 04/07/20   Bonnielee Haff, MD  sodium chloride (OCEAN) 0.65 % SOLN nasal spray Place 2 sprays into both nostrils 4 (four) times daily as needed for congestion.    [provider]  tamsulosin (FLOMAX) 0.4 MG CAPS capsule Take 0.4 mg by mouth daily.    [provider]  Tiotropium Bromide-Olodaterol (STIOLTO RESPIMAT) 2.5-2.5 MCG/ACT AERS Inhale 2 puffs into the lungs daily.    [provider]  traMADol (ULTRAM) 50 MG tablet Take 100 mg by mouth at bedtime.    [provider]  traZODone (DESYREL) 50 MG tablet Take 75 mg by mouth at bedtime.    [provider]  VITAMIN A PO Take 1 tablet by mouth daily.    [provider]  vitamin C (ASCORBIC ACID) 500 MG tablet Take 500 mg by mouth 2 (two) times daily.     [provider]     Allergies    Demerol [meperidine], Zocor [simvastatin], Beet [beta vulgaris], and Liver  Review of Systems   Review of Systems  Constitutional: Negative for chills and fever.  HENT: Negative for ear pain and sore throat.   Eyes: Negative for pain and visual disturbance.  Respiratory: Positive for shortness of breath. Negative for cough.   Cardiovascular: Negative for chest pain and palpitations.  Gastrointestinal: Positive for abdominal pain and constipation. Negative for vomiting.  Genitourinary: Negative for dysuria and hematuria.  Musculoskeletal: Negative for arthralgias and back pain.  Skin: Negative for color change and rash.  Neurological: Positive for weakness. Negative for seizures and syncope.       Diffuse weakness  All other systems reviewed and are negative.   Physical Exam Updated Vital Signs BP (!) 165/83 (BP Location: Right Arm)   Pulse (!) 110   Temp 98.1 F (36.7 C) (Oral)   Resp 20   Ht _0  (1.88 m)   Wt (!) 154.2 kg   SpO2 91%   BMI 43.65 kg/m   Physical Exam Vitals and nursing note reviewed.  Constitutional:      Appearance: He is well-developed.     Comments: Appears very dyspneic when he changes positions  HENT:     Head: Normocephalic and atraumatic.  Eyes:     Conjunctiva/sclera: Conjunctivae normal.  Cardiovascular:     Rate and Rhythm: Regular rhythm. Tachycardia present.     Heart sounds: No murmur heard.   Pulmonary:     Breath sounds: Normal breath sounds. No wheezing.     Comments: Tachypnea; becomes very short of breath with position changes and even speaking Abdominal:     General: There is distension.     Palpations: Abdomen is soft.     Tenderness: There is no abdominal tenderness.  Genitourinary:    Rectum: Normal.     Comments: No fecal impaction Musculoskeletal:     Cervical back: Neck supple.  Skin:    General: Skin is warm and dry.  Neurological:  General: No focal deficit present.     Mental Status: He  is alert. Mental status is at baseline.  Psychiatric:        Mood and Affect: Mood normal.     ED Results / Procedures / Treatments   Labs (all labs ordered are listed, but only abnormal results are displayed) Labs Reviewed  COMPREHENSIVE METABOLIC PANEL - Abnormal; Notable for the following components:      Result Value   Glucose, Bld 111 (*)    All other components within normal limits  CBC WITH DIFFERENTIAL/PLATELET - Abnormal; Notable for the following components:   WBC 12.0 (*)    Monocytes Absolute 1.3 (*)    All other components within normal limits  CBC - Abnormal; Notable for the following components:   WBC 11.2 (*)    All other components within normal limits  RESP PANEL BY RT-PCR (FLU A&B, COVID) ARPGX2  LIPASE, BLOOD  URINALYSIS, ROUTINE W REFLEX MICROSCOPIC  BRAIN NATRIURETIC PEPTIDE  D-DIMER, QUANTITATIVE  HIV ANTIBODY (ROUTINE TESTING W REFLEX)  CBC  CREATININE, SERUM  HEMOGLOBIN A1C  TROPONIN I (HIGH SENSITIVITY)  TROPONIN I (HIGH SENSITIVITY)    EKG EKG Interpretation  Date/Time:  Saturday Jul 25 2020 21:56:03 EDT Ventricular Rate:  96 PR Interval:  211 QRS Duration: 121 QT Interval:  359 QTC Calculation: 454 R Axis:   -19 Text Interpretation: Sinus rhythm Prolonged PR interval Nonspecific intraventricular conduction delay 12 Lead; [place] No significant change since last tracing Confirmed by Dorie Rank [phone]) on 07/25/2020 10:06:50 PM   Radiology CT ABDOMEN PELVIS WO CONTRAST  Result Date: 07/25/2020 CLINICAL DATA:  Constipation for 10 days, abdominal pain EXAM: CT ABDOMEN AND PELVIS WITHOUT CONTRAST TECHNIQUE: Multidetector CT imaging of the abdomen and pelvis was performed following the standard protocol without IV contrast. COMPARISON:  04/03/2020, 12/26/2019 FINDINGS: Lower chest: Bullous emphysematous changes are again seen at the lung bases. Trace left pleural effusion again identified, slightly increased in size since prior study. The  somewhat spiculated area of subpleural consolidation within the left lower lobe on prior studies is again identified, now measuring approximately 4.6 x 3.8 x 3.5 cm, increased since 12/26/2019 with this area measured approximately 3.8 x 3.2 x 2.0 cm with less masslike character. Underlying neoplasm cannot be excluded, and follow-up PET CT may be useful. Hepatobiliary: Unenhanced imaging of the liver and gallbladder demonstrates no focal abnormalities. No evidence of cholelithiasis or cholecystitis. Hepatic steatosis again noted. Pancreas: Unremarkable. No pancreatic ductal dilatation or surrounding inflammatory changes. Spleen: Normal in size without focal abnormality. Adrenals/Urinary Tract: No urinary tract calculi or obstructive uropathy. Bilateral renal hypodensities are noted compatible with cortical cysts. The adrenals are unremarkable. Bladder is minimally distended with no focal abnormality. Stomach/Bowel: No bowel obstruction or ileus. Sigmoid diverticulosis without diverticulitis. There is significant retained stool throughout the colon consistent with given history of constipation. No bowel wall thickening or inflammatory change. Vascular/Lymphatic: Aortic atherosclerosis. No enlarged abdominal or pelvic lymph nodes. Reproductive: Prostate is unremarkable. Other: No free fluid or free intraperitoneal gas. Small fat containing left inguinal hernia unchanged. No bowel herniation. Musculoskeletal: No acute or destructive bony lesions. Reconstructed images demonstrate no additional findings. IMPRESSION: 1. Significant fecal retention consistent with history of constipation. No bowel obstruction or ileus. 2. increase in size of the subpleural left lower lobe consolidation, with increasing masslike character in comparison to 12/24/2019 exam. Neoplasm cannot be excluded, and follow-up PET CT is recommended. 3. Sigmoid diverticulosis without diverticulitis. 4. Trace left pleural effusion, slightly  increased in  size since prior study. 5. Hepatic steatosis. 6. Aortic Atherosclerosis (ICD10-I70.0) and Emphysema (ICD10-J43.9). Electronically Signed   By: Randa Ngo M.D.   On: 07/25/2020 21:29   CT Angio Chest PE W and/or Wo Contrast  Result Date: 07/26/2020 CLINICAL DATA:  Hypoxia.  Pulmonary embolism suspected EXAM: CT ANGIOGRAPHY CHEST WITH CONTRAST TECHNIQUE: Multidetector CT imaging of the chest was performed using the standard protocol during bolus administration of intravenous contrast. Multiplanar CT image reconstructions and MIPs were obtained to evaluate the vascular anatomy. CONTRAST:  138m OMNIPAQUE IOHEXOL 350 MG/ML SOLN COMPARISON:  04/03/2020 FINDINGS: Cardiovascular: Satisfactory opacification of the pulmonary arteries to the segmental level. No evidence of pulmonary embolism when allowing for levels of obscuration due to motion artifact. Normal heart size. No pericardial effusion. Aortic and coronary atherosclerosis. Mediastinum/Nodes: Negative for adenopathy or mass. Lungs/Pleura: Centrilobular and panlobular emphysema. Chronic opacity with cavitary features the left costophrenic sulcus laterally, up to 5.7 cm. Dimensions are mildly increased from 28828and certainly increased from 2018. 7 mm pulmonary nodule in the left upper lobe is stable from 2021. Upper Abdomen: No acute finding Musculoskeletal: Generalized spondylitic spurring with multilevel bridging. No acute finding. Review of the MIP images confirms the above findings. IMPRESSION: 1. No evidence of pulmonary embolism. Moderate artifact due to respiratory motion. 2. Chronic airspace opacity with cavitation in the left lower lobe, gradually enlarging since 2018. An adenocarcinoma could have this appearance, recommend specific follow-up. 3. Emphysema and pulmonary scarring. Trace pleural fluid on the left. Electronically Signed   By: JMonte FantasiaM.D.   On: 07/26/2020 04:53   DG Chest Port 1 View  Result Date: 07/25/2020 CLINICAL DATA:   70year old male with shortness of breath. EXAM: PORTABLE CHEST 1 VIEW COMPARISON:  Chest radiograph dated 07/22/2020. FINDINGS: Background of emphysema. Bibasilar atelectasis/scarring. No focal consolidation, pleural effusion, or pneumothorax. The cardiac silhouette is within normal limits. No acute osseous pathology. IMPRESSION: No active disease. Electronically Signed   By: AAnner CreteM.D.   On: 07/25/2020 23:28    Procedures Procedures   Medications Ordered in ED Medications  lactulose (CHRONULAC) 10 GM/15ML solution 10 g (10 g Oral Patient Refused/Not Given 07/26/20 1330)  heparin injection 5,000 Units (has no administration in time range)  predniSONE (DELTASONE) tablet 40 mg (has no administration in time range)  acetaminophen (TYLENOL) tablet 650 mg (has no administration in time range)    Or  acetaminophen (TYLENOL) suppository 650 mg (has no administration in time range)  docusate sodium (COLACE) capsule 100 mg (has no administration in time range)  ondansetron (ZOFRAN) tablet 4 mg (has no administration in time range)    Or  ondansetron (ZOFRAN) injection 4 mg (has no administration in time range)  insulin aspart (novoLOG) injection 0-15 Units (has no administration in time range)  insulin aspart (novoLOG) injection 0-5 Units (has no administration in time range)  arformoterol (BROVANA) nebulizer solution 15 mcg (15 mcg Nebulization Given 07/26/20 1325)  revefenacin (YUPELRI) nebulizer solution 175 mcg (175 mcg Nebulization Given 07/26/20 1325)  azithromycin (ZITHROMAX) tablet 500 mg (500 mg Oral Given 07/26/20 1335)    Followed by  azithromycin (ZITHROMAX) tablet 250 mg (has no administration in time range)  ipratropium-albuterol (DUONEB) 0.5-2.5 (3) MG/3ML nebulizer solution 3 mL (3 mLs Nebulization Given 07/26/20 0427)  heparin bolus via infusion 5,000 Units (5,000 Units Intravenous Bolus from Bag 07/26/20 0424)  iohexol (OMNIPAQUE) 350 MG/ML injection 100 mL (100 mLs  Intravenous Contrast Given 07/26/20 0350)    ED  Course  I have reviewed the triage vital signs and the nursing notes.  Pertinent labs & imaging results that were available during my care of the patient were reviewed by me and considered in my medical decision making (see chart for details).    MDM Rules/Calculators/A&P                          [name] presented with constipation.  CT scan did not reveal acute findings intra-abdominal he.  No fecal impaction.  Enema will be administered.  He did complain of shortness of breath, and he was tachycardic and hypoxic.  At this point, I decided to expand his ED work-up to evaluate for pneumonia, pleural effusion, CHF, ACS, or worsening of a possible pulmonary malignancy seen on today's CT scan. Final Clinical Impression(s) / ED Diagnoses Final diagnoses:  Constipation, unspecified constipation type  Shortness of breath  Mass of left lung    Rx / DC Orders ED Discharge Orders    None       Arnaldo Natal, MD 07/26/20 1535

## 2020-07-26 ENCOUNTER — Other Ambulatory Visit: Payer: Self-pay

## 2020-07-26 ENCOUNTER — Inpatient Hospital Stay (HOSPITAL_COMMUNITY): Payer: Medicare Other

## 2020-07-26 ENCOUNTER — Encounter (HOSPITAL_COMMUNITY): Payer: Self-pay

## 2020-07-26 DIAGNOSIS — J9621 Acute and chronic respiratory failure with hypoxia: Secondary | ICD-10-CM | POA: Diagnosis present

## 2020-07-26 DIAGNOSIS — E1165 Type 2 diabetes mellitus with hyperglycemia: Secondary | ICD-10-CM | POA: Diagnosis present

## 2020-07-26 DIAGNOSIS — C3490 Malignant neoplasm of unspecified part of unspecified bronchus or lung: Secondary | ICD-10-CM | POA: Diagnosis present

## 2020-07-26 DIAGNOSIS — Z9151 Personal history of suicidal behavior: Secondary | ICD-10-CM | POA: Diagnosis not present

## 2020-07-26 DIAGNOSIS — F32A Depression, unspecified: Secondary | ICD-10-CM | POA: Diagnosis present

## 2020-07-26 DIAGNOSIS — K5900 Constipation, unspecified: Secondary | ICD-10-CM | POA: Diagnosis present

## 2020-07-26 DIAGNOSIS — Z87891 Personal history of nicotine dependence: Secondary | ICD-10-CM | POA: Diagnosis not present

## 2020-07-26 DIAGNOSIS — G8929 Other chronic pain: Secondary | ICD-10-CM | POA: Diagnosis present

## 2020-07-26 DIAGNOSIS — J984 Other disorders of lung: Secondary | ICD-10-CM | POA: Diagnosis not present

## 2020-07-26 DIAGNOSIS — Z885 Allergy status to narcotic agent status: Secondary | ICD-10-CM | POA: Diagnosis not present

## 2020-07-26 DIAGNOSIS — J441 Chronic obstructive pulmonary disease with (acute) exacerbation: Secondary | ICD-10-CM | POA: Diagnosis present

## 2020-07-26 DIAGNOSIS — J9601 Acute respiratory failure with hypoxia: Secondary | ICD-10-CM | POA: Diagnosis not present

## 2020-07-26 DIAGNOSIS — Z20822 Contact with and (suspected) exposure to covid-19: Secondary | ICD-10-CM | POA: Diagnosis present

## 2020-07-26 DIAGNOSIS — R918 Other nonspecific abnormal finding of lung field: Secondary | ICD-10-CM | POA: Diagnosis not present

## 2020-07-26 DIAGNOSIS — Z888 Allergy status to other drugs, medicaments and biological substances status: Secondary | ICD-10-CM | POA: Diagnosis not present

## 2020-07-26 DIAGNOSIS — Z6841 Body Mass Index (BMI) 40.0 and over, adult: Secondary | ICD-10-CM | POA: Diagnosis not present

## 2020-07-26 DIAGNOSIS — G4733 Obstructive sleep apnea (adult) (pediatric): Secondary | ICD-10-CM | POA: Diagnosis present

## 2020-07-26 DIAGNOSIS — Z85828 Personal history of other malignant neoplasm of skin: Secondary | ICD-10-CM | POA: Diagnosis not present

## 2020-07-26 DIAGNOSIS — Z79899 Other long term (current) drug therapy: Secondary | ICD-10-CM | POA: Diagnosis not present

## 2020-07-26 DIAGNOSIS — M542 Cervicalgia: Secondary | ICD-10-CM | POA: Diagnosis present

## 2020-07-26 DIAGNOSIS — R0602 Shortness of breath: Secondary | ICD-10-CM | POA: Diagnosis present

## 2020-07-26 DIAGNOSIS — I252 Old myocardial infarction: Secondary | ICD-10-CM | POA: Diagnosis not present

## 2020-07-26 DIAGNOSIS — E871 Hypo-osmolality and hyponatremia: Secondary | ICD-10-CM | POA: Diagnosis not present

## 2020-07-26 DIAGNOSIS — F419 Anxiety disorder, unspecified: Secondary | ICD-10-CM | POA: Diagnosis present

## 2020-07-26 DIAGNOSIS — I10 Essential (primary) hypertension: Secondary | ICD-10-CM | POA: Diagnosis present

## 2020-07-26 DIAGNOSIS — Z91018 Allergy to other foods: Secondary | ICD-10-CM | POA: Diagnosis not present

## 2020-07-26 DIAGNOSIS — M545 Low back pain, unspecified: Secondary | ICD-10-CM | POA: Diagnosis present

## 2020-07-26 LAB — CBC
HCT: 43.1 % (ref 39.0–52.0)
HCT: 42.9 % (ref 39.0–52.0)
Hemoglobin: 14.2 g/dL (ref 13.0–17.0)
Hemoglobin: 14.3 g/dL (ref 13.0–17.0)
MCH: 32.3 pg (ref 26.0–34.0)
MCH: 32.7 pg (ref 26.0–34.0)
MCHC: 32.9 g/dL (ref 30.0–36.0)
MCHC: 33.3 g/dL (ref 30.0–36.0)
MCV: 98.2 fL (ref 80.0–100.0)
MCV: 98.2 fL (ref 80.0–100.0)
Platelets: 269 10*3/uL (ref 150–400)
Platelets: 258 10*3/uL (ref 150–400)
RBC: 4.39 MIL/uL (ref 4.22–5.81)
RBC: 4.37 MIL/uL (ref 4.22–5.81)
RDW: 13.2 % (ref 11.5–15.5)
RDW: 13.2 % (ref 11.5–15.5)
WBC: 11.2 10*3/uL — ABNORMAL HIGH (ref 4.0–10.5)
WBC: 8.6 10*3/uL (ref 4.0–10.5)
nRBC: 0 % (ref 0.0–0.2)
nRBC: 0 % (ref 0.0–0.2)

## 2020-07-26 LAB — RESP PANEL BY RT-PCR (FLU A&B, COVID) ARPGX2
Influenza A by PCR: NEGATIVE
Influenza B by PCR: NEGATIVE
SARS Coronavirus 2 by RT PCR: NEGATIVE

## 2020-07-26 LAB — D-DIMER, QUANTITATIVE: D-Dimer, Quant: 0.47 ug/mL-FEU (ref 0.00–0.50)

## 2020-07-26 LAB — TROPONIN I (HIGH SENSITIVITY)
Troponin I (High Sensitivity): 4 ng/L (ref <18)
Troponin I (High Sensitivity): 5 ng/L (ref <18)

## 2020-07-26 LAB — HIV ANTIBODY (ROUTINE TESTING W REFLEX): HIV Screen 4th Generation wRfx: NONREACTIVE

## 2020-07-26 LAB — CREATININE, SERUM
Creatinine, Ser: 1.28 mg/dL — ABNORMAL HIGH (ref 0.61–1.24)
GFR, Estimated: >60 mL/min (ref >60)

## 2020-07-26 LAB — BRAIN NATRIURETIC PEPTIDE: B Natriuretic Peptide: 23.7 pg/mL (ref 0.0–100.0)

## 2020-07-26 LAB — GLUCOSE, CAPILLARY
Glucose-Capillary: 114 mg/dL — ABNORMAL HIGH (ref 70–99)
Glucose-Capillary: 129 mg/dL — ABNORMAL HIGH (ref 70–99)

## 2020-07-26 MED ORDER — ACETAMINOPHEN 325 MG PO TABS: 650.0000 mg | ORAL_TABLET | Freq: Four times a day (QID) | ORAL | Status: DC | PRN

## 2020-07-26 MED ORDER — HEPARIN BOLUS VIA INFUSION
5000.0000 [IU] | Freq: Once | INTRAVENOUS | Status: AC
  Administered 2020-07-26: 5000 [IU] via INTRAVENOUS
  Filled 2020-07-26: qty 5000

## 2020-07-26 MED ORDER — PREDNISONE 20 MG PO TABS: 40.0000 mg | ORAL_TABLET | Freq: Every day | ORAL | Status: DC

## 2020-07-26 MED ORDER — ARFORMOTEROL TARTRATE 15 MCG/2ML IN NEBU
15.0000 ug | INHALATION_SOLUTION | Freq: Two times a day (BID) | RESPIRATORY_TRACT | Status: DC
  Administered 2020-07-26 – 2020-08-05 (×19): 15 ug via RESPIRATORY_TRACT
  Filled 2020-07-26 (×20): qty 2

## 2020-07-26 MED ORDER — LACTULOSE 10 GM/15ML PO SOLN
10.0000 g | Freq: Two times a day (BID) | ORAL | Status: DC
  Administered 2020-07-27 – 2020-07-29 (×5): 10 g via ORAL
  Filled 2020-07-26 (×6): qty 15

## 2020-07-26 MED ORDER — MELATONIN 3 MG PO TABS
6.0000 mg | ORAL_TABLET | Freq: Every day | ORAL | Status: DC
  Administered 2020-07-26 – 2020-08-04 (×10): 6 mg via ORAL
  Filled 2020-07-26 (×10): qty 2

## 2020-07-26 MED ORDER — DOCUSATE SODIUM 100 MG PO CAPS
100.0000 mg | ORAL_CAPSULE | Freq: Two times a day (BID) | ORAL | Status: DC
  Administered 2020-07-26 – 2020-07-29 (×6): 100 mg via ORAL
  Filled 2020-07-26 (×6): qty 1

## 2020-07-26 MED ORDER — IOHEXOL 350 MG/ML SOLN
100.0000 mL | Freq: Once | INTRAVENOUS | Status: AC | PRN
  Administered 2020-07-26: 100 mL via INTRAVENOUS

## 2020-07-26 MED ORDER — AZITHROMYCIN 250 MG PO TABS
250.0000 mg | ORAL_TABLET | Freq: Every day | ORAL | Status: AC
  Administered 2020-07-27 – 2020-07-30 (×4): 250 mg via ORAL
  Filled 2020-07-26 (×4): qty 1

## 2020-07-26 MED ORDER — IPRATROPIUM-ALBUTEROL 0.5-2.5 (3) MG/3ML IN SOLN
3.0000 mL | RESPIRATORY_TRACT | Status: AC
  Administered 2020-07-26 (×2): 3 mL via RESPIRATORY_TRACT
  Filled 2020-07-26: qty 6

## 2020-07-26 MED ORDER — HEPARIN SODIUM (PORCINE) 5000 UNIT/ML IJ SOLN
5000.0000 [IU] | Freq: Three times a day (TID) | INTRAMUSCULAR | Status: DC
  Administered 2020-07-26 – 2020-08-05 (×28): 5000 [IU] via SUBCUTANEOUS
  Filled 2020-07-26 (×28): qty 1

## 2020-07-26 MED ORDER — DULOXETINE HCL 60 MG PO CPEP
60.0000 mg | ORAL_CAPSULE | Freq: Two times a day (BID) | ORAL | Status: DC
  Administered 2020-07-26 – 2020-08-05 (×20): 60 mg via ORAL
  Filled 2020-07-26 (×20): qty 1

## 2020-07-26 MED ORDER — BUSPIRONE HCL 5 MG PO TABS
15.0000 mg | ORAL_TABLET | Freq: Two times a day (BID) | ORAL | Status: DC
  Administered 2020-07-26 – 2020-08-05 (×20): 15 mg via ORAL
  Filled 2020-07-26: qty 1
  Filled 2020-07-26 (×8): qty 3
  Filled 2020-07-26: qty 1
  Filled 2020-07-26 (×2): qty 3
  Filled 2020-07-26: qty 1
  Filled 2020-07-26 (×2): qty 3
  Filled 2020-07-26 (×5): qty 1

## 2020-07-26 MED ORDER — ACETAMINOPHEN 325 MG PO TABS
650.0000 mg | ORAL_TABLET | Freq: Four times a day (QID) | ORAL | Status: DC | PRN
  Administered 2020-07-26 – 2020-08-05 (×12): 650 mg via ORAL
  Filled 2020-07-26 (×13): qty 2

## 2020-07-26 MED ORDER — ONDANSETRON HCL 4 MG/2ML IJ SOLN
4.0000 mg | Freq: Four times a day (QID) | INTRAMUSCULAR | Status: DC | PRN
  Administered 2020-07-30 – 2020-08-01 (×2): 4 mg via INTRAVENOUS
  Filled 2020-07-26: qty 2

## 2020-07-26 MED ORDER — TRAZODONE HCL 50 MG PO TABS
75.0000 mg | ORAL_TABLET | Freq: Every day | ORAL | Status: DC
  Administered 2020-07-26 – 2020-08-04 (×11): 75 mg via ORAL
  Filled 2020-07-26 (×10): qty 2

## 2020-07-26 MED ORDER — INSULIN ASPART 100 UNIT/ML IJ SOLN
0.0000 [IU] | Freq: Every day | INTRAMUSCULAR | Status: DC
  Administered 2020-07-29 – 2020-08-04 (×4): 2 [IU] via SUBCUTANEOUS

## 2020-07-26 MED ORDER — REVEFENACIN 175 MCG/3ML IN SOLN
175.0000 ug | Freq: Every day | RESPIRATORY_TRACT | Status: DC
  Administered 2020-07-26 – 2020-08-05 (×9): 175 ug via RESPIRATORY_TRACT
  Filled 2020-07-26 (×11): qty 3

## 2020-07-26 MED ORDER — ACETAMINOPHEN 650 MG RE SUPP: 650.0000 mg | Freq: Four times a day (QID) | RECTAL | Status: DC | PRN

## 2020-07-26 MED ORDER — ONDANSETRON HCL 4 MG PO TABS
4.0000 mg | ORAL_TABLET | Freq: Four times a day (QID) | ORAL | Status: DC | PRN
  Administered 2020-08-01 – 2020-08-04 (×2): 4 mg via ORAL
  Filled 2020-07-26 (×3): qty 1

## 2020-07-26 MED ORDER — INSULIN ASPART 100 UNIT/ML IJ SOLN
0.0000 [IU] | Freq: Three times a day (TID) | INTRAMUSCULAR | Status: DC
  Administered 2020-07-27 (×3): 2 [IU] via SUBCUTANEOUS
  Administered 2020-07-28 (×2): 3 [IU] via SUBCUTANEOUS
  Administered 2020-07-28: 2 [IU] via SUBCUTANEOUS
  Administered 2020-07-29 – 2020-07-31 (×5): 3 [IU] via SUBCUTANEOUS
  Administered 2020-07-31: 8 [IU] via SUBCUTANEOUS
  Administered 2020-07-31 – 2020-08-02 (×3): 2 [IU] via SUBCUTANEOUS
  Administered 2020-08-03 (×2): 3 [IU] via SUBCUTANEOUS
  Administered 2020-08-03: 2 [IU] via SUBCUTANEOUS
  Administered 2020-08-04: 5 [IU] via SUBCUTANEOUS
  Administered 2020-08-04 – 2020-08-05 (×4): 3 [IU] via SUBCUTANEOUS

## 2020-07-26 MED ORDER — PREDNISONE 20 MG PO TABS
40.0000 mg | ORAL_TABLET | Freq: Every day | ORAL | Status: AC
  Administered 2020-07-27 – 2020-07-31 (×5): 40 mg via ORAL
  Filled 2020-07-26 (×5): qty 2

## 2020-07-26 MED ORDER — AZITHROMYCIN 250 MG PO TABS
500.0000 mg | ORAL_TABLET | Freq: Every day | ORAL | Status: AC
  Administered 2020-07-26: 500 mg via ORAL
  Filled 2020-07-26: qty 2

## 2020-07-26 MED ORDER — BUPROPION HCL ER (XL) 300 MG PO TB24
300.0000 mg | ORAL_TABLET | Freq: Every day | ORAL | Status: DC
  Administered 2020-07-27 – 2020-08-05 (×10): 300 mg via ORAL
  Filled 2020-07-26 (×10): qty 1

## 2020-07-26 MED ORDER — HEPARIN (PORCINE) 25000 UT/250ML-% IV SOLN
1900.0000 [IU]/h | INTRAVENOUS | Status: DC
  Administered 2020-07-26: 1900 [IU]/h via INTRAVENOUS
  Filled 2020-07-26: qty 250

## 2020-07-26 NOTE — Consult Note (Signed)
NAME:  ISHAAQ PENNA, MRN:  [id number], DOB:  02/29/1951, LOS: 0 ADMISSION DATE:  07/25/2020, CONSULTATION DATE:  07/26/20 REFERRING MD:  Cherylann Ratel, DO CHIEF COMPLAINT:  Lung Mass  History of Present Illness:  [name] is a 70 year old male, former smoker with COPD/emphysema, obstructive sleep apnea on CPAP with 2 L O2, obesity, hypertension and Uveitis/HLA-B27 positivity (on methotrexate and mycophenolate) who is admitted for abdominal pain due to constipation and COPD exacerbation.  PCCM has been consulted for evaluation of a left lower lobe cavitary mass.  Patient has about a 60-pack-year smoking history and quit in 2018 after he had a pneumothorax on the left.  He denies any cough or hemoptysis.  He denies any night sweats, chills, weight loss or loss of appetite.  No family history of lung cancer. He is followed by a pulmonologist at the [facility] clinic for his COPD and uses stiolto daily.   He reports not having a bowel movement over the past 2 weeks. He reports his breathing has worsened over the past week.   CT scans have been reviewed and he has an enlarging left lower lobe mass with cavitatry component first noted in 2018 and is currently 5.7cm.    We discussed the options of pursuing a biopsy of the lung lesion via navigational bronchoscopy and the patient is amenable to having that done while he is in the hospital.    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 Admitted 5/29  Interim History / Subjective:    Objective   Blood pressure 136/82, pulse 91, temperature 98 F (36.7 C), temperature source Oral, resp. rate 20, height _0  (1.88 m), weight (!) 154.2 kg, SpO2 95 %.        No intake or output data in the 24 hours ending 07/26/20 1029 Filed Weights   07/25/20 1944  Weight: (!) 154.2 kg    Examination: General: elderly male, obese, no acute distress HENT: [place]/AT, moist mucous membranes, sclera anicteric Lungs: Diminished breath sounds bilaterally.  No wheezing or rhonchi.  No rales. Cardiovascular: Regular rate and rhythm, no murmurs. Abdomen: Soft, nontender, nondistended, active bowel sounds present Extremities: Warm, no edema Neuro: Alert, oriented x3, moving all extremities. GU: Deferred  Labs/imaging that I have personally reviewed  (right click and "Reselect all SmartList Selections" daily)   CT Chest scans 09/17/16, 12/26/19, 04/03/20 and 07/26/20  - Centrilobular and panlobular emphysema. 5.7cm mass of left lower lobe near costophrenic sulcus with cavitary component. Initially was a thick rimmed cavitary lesion in 2018.   5/29: WBC 11.2k, Hgb 14.2, plts 269 Influenza and Covid negative.  UA unremarkable.  Cr 1.05, AST/ALT 34/43, total protein 7.5  Resolved Hospital Problem list     Assessment & Plan:   Left lower lobe cavitary lung mass Given patient's smoking history and age, he is high risk for lung cancer. Discussed case with Dr. Valeta Harms and we will work on scheduling him for navigational bronchoscopy on 6/1. The cavitary lesion is less likely inflammatory as HLA-B27 patients typically have fibrocystic changes of the apices.   - Patient is to be NPO night before the procedure - Hold DVT prophylaxis the morning of the procedure - Patient has not been taking any antiplatelet agents - The risks of the procedure  have been explained to him which include bleeding, pneumothorax and infection.   COPD with exacerbation - start $Remov'40mg'unjFPi$  prednisone PO for 5 days and azithromycin for 5 days - start yupelri and brovana nebs  PCCM will continue to follow  Labs   CBC: Recent Labs  Lab 07/22/20 1550 07/25/20 2140 07/26/20 0448  WBC 11.5* 12.0*  11.2*  NEUTROABS 7.8* 6.9  --   HGB 15.0 15.1 14.2  HCT 44.8 45.0 43.1  MCV 97.4 98.7 98.2  PLT 270 276 060    Basic Metabolic Panel: Recent Labs  Lab 07/22/20 1550 07/25/20 2140  NA 135 135  K 4.4 4.5  CL 98 101  CO2 27 23  GLUCOSE 108* 111*  BUN 15 15  CREATININE 1.06 1.05  CALCIUM 9.1 9.3   GFR: Estimated Creatinine Clearance: 105.7 mL/min (by C-G formula based on SCr of 1.05 mg/dL). Recent Labs  Lab 07/22/20 1550 07/25/20 2140 07/26/20 0448  WBC 11.5* 12.0* 11.2*    Liver Function Tests: Recent Labs  Lab 07/22/20 1550 07/25/20 2140  AST 36 34  ALT 39 43  ALKPHOS 52 51  BILITOT 0.5 0.4  PROT 7.3 7.5  ALBUMIN 4.3 4.3   Recent Labs  Lab 07/25/20 2140  LIPASE 33   No results for input(s): AMMONIA in the last 168 hours.  ABG No results found for: PHART, PCO2ART, PO2ART, HCO3, TCO2, ACIDBASEDEF, O2SAT   Coagulation Profile: No results for input(s): INR, PROTIME in the last 168 hours.  Cardiac Enzymes: No results for input(s): CKTOTAL, CKMB, CKMBINDEX, TROPONINI in the last 168 hours.  HbA1C: Hgb A1c MFr Bld  Date/Time Value Ref Range Status  01/04/2020 04:45 PM 7.5 (H) 4.8 - 5.6 % Final    Comment:    (NOTE) Pre diabetes:          5.7%-6.4%  Diabetes:              >6.4%  Glycemic control for   <7.0% adults with diabetes   12/27/2019 03:04 AM 7.8 (H) 4.8 - 5.6 % Final    Comment:    (NOTE)         Prediabetes: 5.7 - 6.4         Diabetes: >6.4         Glycemic control for adults with diabetes: <7.0     CBG: No results for input(s): GLUCAP in the last 168 hours.  Review of Systems:   Review of Systems  Constitutional: Negative for chills, fever, malaise/fatigue and weight loss.  HENT: Negative for congestion, sinus pain and sore throat.   Eyes: Negative.   Respiratory: Positive for shortness of breath. Negative for cough, hemoptysis, sputum production and wheezing.   Cardiovascular: Negative for chest pain, palpitations, orthopnea,  claudication and leg swelling.  Gastrointestinal: Positive for abdominal pain and constipation. Negative for heartburn, nausea and vomiting.  Genitourinary: Negative.   Musculoskeletal: Negative for joint pain and myalgias.  Skin: Negative for rash.  Neurological: Positive for weakness.  Endo/Heme/Allergies: Negative.   Psychiatric/Behavioral: Negative.    Past Medical History:  He,  has a past medical history of Anxiety, Bronchitis, COPD (chronic obstructive pulmonary disease) ([place]), Depression, Hypertension, Mental disorder, MI (myocardial infarction) ([place]), OSA (obstructive sleep apnea), Suicide attempt ([place]), Tension pneumothorax (06/27/2016), and Uveitis.   Surgical History:   Past Surgical History:  Procedure Laterality Date  . CHEST TUBE INSERTION Left 06/27/2016  . cryptorchidism    . SKIN CANCER EXCISION       Social History:  reports that he quit smoking about 4 years ago. His smoking use included cigarettes. He has a 35.00 pack-year smoking history. He has never used smokeless tobacco. He reports that he does not drink alcohol and does not use drugs.   Family History:  His family history includes Dementia in his father.   Allergies Allergies  Allergen Reactions  . Demerol [Meperidine] Nausea And Vomiting and Other (See Comments)    Made the patient "violently sick"  . Zocor [Simvastatin] Nausea And Vomiting and Other (See Comments)    Made him very jittery, also  . Beet [Beta Vulgaris] Nausea And Vomiting  . Codeine     Other reaction(s): NAUSEA,VOMITING  . Liver Nausea And Vomiting     Home Medications  Prior to Admission medications   Medication Sig Start Date End Date Taking? Authorizing Provider  buPROPion (WELLBUTRIN XL) 300 MG 24 hr tablet Take 300 mg by mouth daily. 02/05/20  Yes [provider]  busPIRone (BUSPAR) 15 MG tablet Take 15 mg by mouth 2 (two) times daily.   Yes [provider]  Cholecalciferol (VITAMIN D-3) 25 MCG (1000 UT)  CAPS Take 1,000 Units by mouth daily.   Yes [provider]  cyclobenzaprine (FLEXERIL) 10 MG tablet Take 20 mg by mouth at bedtime.   Yes [provider]  cyclopentolate (CYCLODRYL,CYCLOGYL) 1 % ophthalmic solution Place 1 drop into the left eye 3 (three) times daily. 12/19/19  Yes [provider]  diltiazem (CARDIZEM CD) 180 MG 24 hr capsule Take 180 mg by mouth daily.   Yes [provider]  Docusate Sodium (DSS) 100 MG CAPS Take 100 mg by mouth daily as needed for constipation. 06/22/20  Yes [provider]  DULoxetine (CYMBALTA) 60 MG capsule Take 1 capsule (60 mg total) by mouth 2 (two) times daily. Patient taking differently: Take 60 mg by mouth 2 (two) times daily with a meal. 07/21/17  Yes Lavina Hamman, MD  folic acid (FOLVITE) 1 MG tablet Take 1 mg by mouth daily.   Yes [provider]  gabapentin (NEURONTIN) 300 MG capsule Take 900 mg by mouth at bedtime.   Yes [provider]  guaifenesin (HUMIBID E) 400 MG TABS tablet Take 400 mg by mouth 2 (two) times daily. 12/20/19  Yes [provider]  lisinopril (ZESTRIL) 20 MG tablet Take 20 mg by mouth daily.   Yes [provider]  lurasidone (LATUDA) 40 MG TABS tablet Take 40 mg by mouth daily after supper.   Yes [provider]  Melatonin 3 MG TABS Take 6 mg by mouth at bedtime.   Yes [provider]  metFORMIN (GLUCOPHAGE) 500 MG tablet Take 500 mg by mouth 2 (two) times daily with a meal.   Yes [provider]  methotrexate (RHEUMATREX) 2.5 MG tablet Take 6 tablets (15 mg total) by mouth every Saturday. Hold until seen by rheumatology  Caution:Chemotherapy. Protect from light. Patient taking differently: Take 15 mg by mouth every Saturday. 07/22/17  Yes Lavina Hamman, MD  Multiple Vitamin (MULTIVITAMIN WITH MINERALS) TABS tablet Take 1 tablet by mouth daily.   Yes [provider]  mycophenolate (CELLCEPT) 250 MG capsule Take  1,500 mg by mouth 2 (two) times daily.   Yes [provider]  naproxen (NAPROSYN) 500 MG tablet Take 500 mg by mouth 2 (two) times daily as needed for mild pain.   Yes [provider]  Olodaterol HCl (STRIVERDI RESPIMAT) 2.5 MCG/ACT AERS Inhale 2 puffs into  the lungs daily.   Yes [provider]  oxcarbazepine (TRILEPTAL) 600 MG tablet Take 600 mg by mouth 3 (three) times daily as needed for pain. 07/16/20  Yes [provider]  Polyethyl Glycol-Propyl Glycol 0.4-0.3 % SOLN Place 1 drop into both eyes in the morning, at noon, in the evening, and at bedtime. 07/13/20  Yes [provider]  Polyvinyl Alcohol-Povidone PF 1.4-0.6 % SOLN Place 1 drop into both eyes 4 (four) times daily as needed for dry eyes. 05/25/20  Yes [provider]  prednisoLONE acetate (PRED FORTE) 1 % ophthalmic suspension Apply 1 drop to eye as directed. Instill one drop in left eye every hour and follow taper schedule for pred forte for the right eye. 12/19/19  Yes [provider]  tamsulosin (FLOMAX) 0.4 MG CAPS capsule Take 0.4 mg by mouth daily.   Yes [provider]  traMADol (ULTRAM) 50 MG tablet Take 50 mg by mouth 2 (two) times daily.   Yes [provider]  traZODone (DESYREL) 50 MG tablet Take 75 mg by mouth at bedtime.   Yes [provider]  acetaminophen (TYLENOL) 500 MG tablet Take 1,000 mg by mouth every 6 (six) hours as needed for moderate pain.    [provider]  b complex vitamins capsule Take 1 capsule by mouth daily.    [provider]  calcium-vitamin D (OSCAL WITH D) 500-200 MG-UNIT tablet Take 1 tablet by mouth 2 (two) times daily with a meal.    [provider]  etanercept (ENBREL) 50 MG/ML injection Inject 50 mg into the skin every Wednesday.    [provider]  fluticasone (FLONASE) 50 MCG/ACT nasal spray Place 2 sprays into both nostrils daily.     [provider]   nitroGLYCERIN (NITROSTAT) 0.4 MG SL tablet Place 1 tablet (0.4 mg total) under the tongue every 5 (five) minutes as needed for chest pain. 07/03/19   Danford, Suann Larry, MD  OVER THE COUNTER MEDICATION Place 1 drop into both ears daily. Miracell ProEar for irritated itchy ears    [provider]  polyethylene glycol (MIRALAX / GLYCOLAX) 17 g packet Take 17 g by mouth daily.    [provider]  PRESCRIPTION MEDICATION Inhale into the lungs See admin instructions. CPAP- At bedtime and during during any naps    [provider]  Propylene Glycol (SYSTANE BALANCE) 0.6 % SOLN Place 1 drop into both eyes in the morning, at noon, and at bedtime.     [provider]  senna-docusate (SENOKOT-S) 8.6-50 MG tablet Take 1 tablet by mouth 2 (two) times daily. 04/07/20   Bonnielee Haff, MD  sodium chloride (OCEAN) 0.65 % SOLN nasal spray Place 2 sprays into both nostrils 4 (four) times daily as needed for congestion.    [provider]  Tiotropium Bromide-Olodaterol (STIOLTO RESPIMAT) 2.5-2.5 MCG/ACT AERS Inhale 2 puffs into the lungs daily.    [provider]  VITAMIN A PO Take 1 tablet by mouth daily.    [provider]  vitamin C (ASCORBIC ACID) 500 MG tablet Take 500 mg by mouth 2 (two) times daily.     [provider]     Critical care time: n/a    Freda Jackson, MD [facility] Office: [phone]   See Amion for personal pager PCCM on call pager [phone] until 7pm. Please call Elink 7p-7a. [phone]

## 2020-07-26 NOTE — ED Notes (Signed)
Patients O2 via  increased to 4L

## 2020-07-26 NOTE — ED Notes (Signed)
Enema instilled. Patient instructed to call staff when he is unable to hold enema in any longer.

## 2020-07-26 NOTE — ED Notes (Signed)
Patient had a large, firm bowel movement. Patient assisted with cleaning himself up.

## 2020-07-26 NOTE — H&P (Signed)
History and Physical    Mario [name]:[id number] DOB: 03-26-1950 DOA: 07/25/2020  PCP: [facility]  Patient coming from: Home  Chief Complaint: constipation and shortness of breath  HPI: Mario [name] is a 70 y.o. male with medical history significant of COPD, HTN, anxiety, DM. Presenting with constipation and shortness of breath. He reports that he's not had a BM in 2 weeks. He chronically takes docusate and miralax. He added mag citrate a couple days ago, and he still hasn't had a BM. He has some global abdominal discomfort. In addition, he has had an increase in shortness of breath over the last few days. He is on CPAP at night w/ 2L [place]. He otherwise normally uses his O2 in a PRN fashion during the day. He has found himself more short of breath whether resting or with activity over the last few days. He has no fever, increased cough, increased peripheral edema, or CP. He became concerned with his symptoms and decided to come to the ED. He denies any other aggravating or alleviating factors.   ED Course: CT ab/pelvis shows significant stool retention. CTA chest was negative for PE but did show a LLL opacity w/ cavitation that is concerning for malignancy. TRH was called for admission.   Review of Systems:  Denies CP, palpitations, N/V/D, fevers, sick contacts. Reports dyspnea, ab pain, constipation. Review of systems is otherwise negative for all not mentioned in HPI.   PMHx Past Medical History:  Diagnosis Date  . Anxiety   . Bronchitis   . COPD (chronic obstructive pulmonary disease) ([place])   . Depression   . Hypertension   . Mental disorder   . MI (myocardial infarction) ([place])    ????  . OSA (obstructive sleep apnea)   . Suicide attempt ([place])   . Tension pneumothorax 06/27/2016  . Uveitis     PSHx Past Surgical History:  Procedure Laterality Date  . CHEST TUBE INSERTION Left 06/27/2016  . cryptorchidism    . SKIN CANCER EXCISION      SocHx  reports  that he quit smoking about 4 years ago. His smoking use included cigarettes. He has a 35.00 pack-year smoking history. He has never used smokeless tobacco. He reports that he does not drink alcohol and does not use drugs.  Allergies  Allergen Reactions  . Demerol [Meperidine] Nausea And Vomiting and Other (See Comments)    Made the patient "violently sick"  . Zocor [Simvastatin] Nausea And Vomiting and Other (See Comments)    Made him very jittery, also  . Beet [Beta Vulgaris] Nausea And Vomiting  . Codeine     Other reaction(s): NAUSEA,VOMITING  . Liver Nausea And Vomiting    FamHx Family History  Problem Relation Age of Onset  . Dementia Father     Prior to Admission medications   Medication Sig Start Date End Date Taking? Authorizing Provider  b complex vitamins capsule Take 1 capsule by mouth daily.   Yes [provider]  busPIRone (BUSPAR) 15 MG tablet Take 15 mg by mouth daily.    Yes [provider]  calcium-vitamin D (OSCAL WITH D) 500-200 MG-UNIT tablet Take 1 tablet by mouth 2 (two) times daily with a meal.   Yes [provider]  Cholecalciferol (VITAMIN D-3) 25 MCG (1000 UT) CAPS Take 1,000 Units by mouth daily.   Yes [provider]  cyclobenzaprine (FLEXERIL) 10 MG tablet Take 10 mg by mouth at bedtime.   Yes [provider]  diltiazem (CARDIZEM CD) 180 MG 24 hr capsule Take 180 mg by mouth daily.   Yes [provider]  docusate calcium (SURFAK) 240 MG capsule Take 240 mg by mouth daily.   Yes [provider]  DULoxetine (CYMBALTA) 60 MG capsule Take 1 capsule (60 mg total) by mouth 2 (two) times daily. Patient taking differently: Take 60 mg by mouth 2 (two) times daily with a meal. 07/21/17  Yes Lavina Hamman, MD  etanercept (ENBREL) 50 MG/ML injection Inject 50 mg into the skin every Wednesday.   Yes [provider]  acetaminophen (TYLENOL) 500 MG tablet Take 1,000 mg by mouth every 6 (six) hours  as needed for moderate pain.    [provider]  fluticasone (FLONASE) 50 MCG/ACT nasal spray Place 2 sprays into both nostrils daily.     [provider]  folic acid (FOLVITE) 1 MG tablet Take 1 mg by mouth daily.    [provider]  gabapentin (NEURONTIN) 300 MG capsule Take 900 mg by mouth at bedtime.    [provider]  lisinopril (ZESTRIL) 20 MG tablet Take 20 mg by mouth daily.    [provider]  lurasidone (LATUDA) 40 MG TABS tablet Take 40 mg by mouth daily after supper.    [provider]  Melatonin 3 MG TABS Take 6 mg by mouth at bedtime.    [provider]  metFORMIN (GLUCOPHAGE) 500 MG tablet Take 500 mg by mouth 2 (two) times daily with a meal.    [provider]  methotrexate (RHEUMATREX) 2.5 MG tablet Take 6 tablets (15 mg total) by mouth every Saturday. Hold until seen by rheumatology  Caution:Chemotherapy. Protect from light. Patient taking differently: Take 15 mg by mouth every Saturday. 07/22/17   Lavina Hamman, MD  Multiple Vitamin (MULTIVITAMIN WITH MINERALS) TABS tablet Take 1 tablet by mouth daily.    [provider]  mycophenolate (CELLCEPT) 250 MG capsule Take 1,500 mg by mouth 2 (two) times daily.    [provider]  nitroGLYCERIN (NITROSTAT) 0.4 MG SL tablet Place 1 tablet (0.4 mg total) under the tongue every 5 (five) minutes as needed for chest pain. 07/03/19   Danford, Suann Larry, MD  OVER THE COUNTER MEDICATION Place 1 drop into both ears daily. Miracell ProEar for irritated itchy ears    [provider]  OVER THE COUNTER MEDICATION Take 2 tablets by mouth daily. Pure Zzz    [provider]  Oxcarbazepine (TRILEPTAL) 300 MG tablet Take 600 mg by mouth 3 (three) times daily.    [provider]  polyethylene glycol (MIRALAX / GLYCOLAX) 17 g packet Take 17 g by mouth daily.    [provider]  PRESCRIPTION MEDICATION Inhale into the lungs See  admin instructions. CPAP- At bedtime and during during any naps    [provider]  Propylene Glycol (SYSTANE BALANCE) 0.6 % SOLN Place 1 drop into both eyes in the morning, at noon, and at bedtime.     [provider]  senna-docusate (SENOKOT-S) 8.6-50 MG tablet Take 1 tablet by mouth 2 (two) times daily. 04/07/20   Bonnielee Haff, MD  sodium chloride (OCEAN) 0.65 % SOLN nasal spray Place 2 sprays into both nostrils 4 (four) times daily as needed for congestion.    [provider]  tamsulosin (FLOMAX) 0.4 MG CAPS capsule Take 0.4 mg by mouth daily.    [provider]  Tiotropium Bromide-Olodaterol (STIOLTO RESPIMAT) 2.5-2.5 MCG/ACT AERS Inhale 2  puffs into the lungs daily.    [provider]  traMADol (ULTRAM) 50 MG tablet Take 100 mg by mouth at bedtime.    [provider]  traZODone (DESYREL) 50 MG tablet Take 75 mg by mouth at bedtime.    [provider]  VITAMIN A PO Take 1 tablet by mouth daily.    [provider]  vitamin C (ASCORBIC ACID) 500 MG tablet Take 500 mg by mouth 2 (two) times daily.     [provider]    Physical Exam: Vitals:   07/26/20 0430 07/26/20 0445 07/26/20 0500 07/26/20 0600  BP: 134/76 138/75 131/84 134/79  Pulse: 96 97 100 92  Resp: (!) 26 (!) 24 (!) 22 (!) 23  Temp:      TempSrc:      SpO2: 98% 91% 92% 93%  Weight:      Height:        General: 70 y.o. male resting in bed in NAD Eyes: PERRL, normal sclera ENMT: Nares patent w/o discharge, orophaynx clear, dentition normal, ears w/o discharge/lesions/ulcers Neck: Supple, trachea midline Cardiovascular: RRR, +S1, S2, no m/g/r, equal pulses throughout Respiratory: somewhat decreased at bases, no w/r/r, normal WOB on 4L [place] GI: BS hypoactive, obese, NDNT, no masses noted, no organomegaly noted MSK: No e/c/c Skin: No rashes, bruises, ulcerations noted Neuro: A&O x 3, no focal deficits Psyc: Appropriate interaction and affect,  calm/cooperative  Labs on Admission: I have personally reviewed following labs and imaging studies  CBC: Recent Labs  Lab 07/22/20 1550 07/25/20 2140 07/26/20 0448  WBC 11.5* 12.0* 11.2*  NEUTROABS 7.8* 6.9  --   HGB 15.0 15.1 14.2  HCT 44.8 45.0 43.1  MCV 97.4 98.7 98.2  PLT 270 276 829   Basic Metabolic Panel: Recent Labs  Lab 07/22/20 1550 07/25/20 2140  NA 135 135  K 4.4 4.5  CL 98 101  CO2 27 23  GLUCOSE 108* 111*  BUN 15 15  CREATININE 1.06 1.05  CALCIUM 9.1 9.3   GFR: Estimated Creatinine Clearance: 105.7 mL/min (by C-G formula based on SCr of 1.05 mg/dL). Liver Function Tests: Recent Labs  Lab 07/22/20 1550 07/25/20 2140  AST 36 34  ALT 39 43  ALKPHOS 52 51  BILITOT 0.5 0.4  PROT 7.3 7.5  ALBUMIN 4.3 4.3   Recent Labs  Lab 07/25/20 2140  LIPASE 33   No results for input(s): AMMONIA in the last 168 hours. Coagulation Profile: No results for input(s): INR, PROTIME in the last 168 hours. Cardiac Enzymes: No results for input(s): CKTOTAL, CKMB, CKMBINDEX, TROPONINI in the last 168 hours. BNP (last 3 results) No results for input(s): PROBNP in the last 8760 hours. HbA1C: No results for input(s): HGBA1C in the last 72 hours. CBG: No results for input(s): GLUCAP in the last 168 hours. Lipid Profile: No results for input(s): CHOL, HDL, LDLCALC, TRIG, CHOLHDL, LDLDIRECT in the last 72 hours. Thyroid Function Tests: No results for input(s): TSH, T4TOTAL, FREET4, T3FREE, THYROIDAB in the last 72 hours. Anemia Panel: No results for input(s): VITAMINB12, FOLATE, FERRITIN, TIBC, IRON, RETICCTPCT in the last 72 hours. Urine analysis:    Component Value Date/Time   COLORURINE YELLOW 07/25/2020 2107   APPEARANCEUR CLEAR 07/25/2020 2107   LABSPEC 1.020 07/25/2020 2107   [place] 7.0 07/25/2020 2107   GLUCOSEU NEGATIVE 07/25/2020 2107   HGBUR NEGATIVE 07/25/2020 2107   BILIRUBINUR NEGATIVE 07/25/2020 2107   [place] NEGATIVE 07/25/2020 2107    PROTEINUR NEGATIVE 07/25/2020 2107  UROBILINOGEN 1.0 01/12/2013 0024   NITRITE NEGATIVE 07/25/2020 2107   LEUKOCYTESUR NEGATIVE 07/25/2020 2107    Radiological Exams on Admission: CT ABDOMEN PELVIS WO CONTRAST  Result Date: 07/25/2020 CLINICAL DATA:  Constipation for 10 days, abdominal pain EXAM: CT ABDOMEN AND PELVIS WITHOUT CONTRAST TECHNIQUE: Multidetector CT imaging of the abdomen and pelvis was performed following the standard protocol without IV contrast. COMPARISON:  04/03/2020, 12/26/2019 FINDINGS: Lower chest: Bullous emphysematous changes are again seen at the lung bases. Trace left pleural effusion again identified, slightly increased in size since prior study. The somewhat spiculated area of subpleural consolidation within the left lower lobe on prior studies is again identified, now measuring approximately 4.6 x 3.8 x 3.5 cm, increased since 12/26/2019 with this area measured approximately 3.8 x 3.2 x 2.0 cm with less masslike character. Underlying neoplasm cannot be excluded, and follow-up PET CT may be useful. Hepatobiliary: Unenhanced imaging of the liver and gallbladder demonstrates no focal abnormalities. No evidence of cholelithiasis or cholecystitis. Hepatic steatosis again noted. Pancreas: Unremarkable. No pancreatic ductal dilatation or surrounding inflammatory changes. Spleen: Normal in size without focal abnormality. Adrenals/Urinary Tract: No urinary tract calculi or obstructive uropathy. Bilateral renal hypodensities are noted compatible with cortical cysts. The adrenals are unremarkable. Bladder is minimally distended with no focal abnormality. Stomach/Bowel: No bowel obstruction or ileus. Sigmoid diverticulosis without diverticulitis. There is significant retained stool throughout the colon consistent with given history of constipation. No bowel wall thickening or inflammatory change. Vascular/Lymphatic: Aortic atherosclerosis. No enlarged abdominal or pelvic lymph nodes.  Reproductive: Prostate is unremarkable. Other: No free fluid or free intraperitoneal gas. Small fat containing left inguinal hernia unchanged. No bowel herniation. Musculoskeletal: No acute or destructive bony lesions. Reconstructed images demonstrate no additional findings. IMPRESSION: 1. Significant fecal retention consistent with history of constipation. No bowel obstruction or ileus. 2. increase in size of the subpleural left lower lobe consolidation, with increasing masslike character in comparison to 12/24/2019 exam. Neoplasm cannot be excluded, and follow-up PET CT is recommended. 3. Sigmoid diverticulosis without diverticulitis. 4. Trace left pleural effusion, slightly increased in size since prior study. 5. Hepatic steatosis. 6. Aortic Atherosclerosis (ICD10-I70.0) and Emphysema (ICD10-J43.9). Electronically Signed   By: Randa Ngo M.D.   On: 07/25/2020 21:29   CT Angio Chest PE W and/or Wo Contrast  Result Date: 07/26/2020 CLINICAL DATA:  Hypoxia.  Pulmonary embolism suspected EXAM: CT ANGIOGRAPHY CHEST WITH CONTRAST TECHNIQUE: Multidetector CT imaging of the chest was performed using the standard protocol during bolus administration of intravenous contrast. Multiplanar CT image reconstructions and MIPs were obtained to evaluate the vascular anatomy. CONTRAST:  165mL OMNIPAQUE IOHEXOL 350 MG/ML SOLN COMPARISON:  04/03/2020 FINDINGS: Cardiovascular: Satisfactory opacification of the pulmonary arteries to the segmental level. No evidence of pulmonary embolism when allowing for levels of obscuration due to motion artifact. Normal heart size. No pericardial effusion. Aortic and coronary atherosclerosis. Mediastinum/Nodes: Negative for adenopathy or mass. Lungs/Pleura: Centrilobular and panlobular emphysema. Chronic opacity with cavitary features the left costophrenic sulcus laterally, up to 5.7 cm. Dimensions are mildly increased from 8563 and certainly increased from 2018. 7 mm pulmonary nodule in the  left upper lobe is stable from 2021. Upper Abdomen: No acute finding Musculoskeletal: Generalized spondylitic spurring with multilevel bridging. No acute finding. Review of the MIP images confirms the above findings. IMPRESSION: 1. No evidence of pulmonary embolism. Moderate artifact due to respiratory motion. 2. Chronic airspace opacity with cavitation in the left lower lobe, gradually enlarging since 2018. An adenocarcinoma could have this  appearance, recommend specific follow-up. 3. Emphysema and pulmonary scarring. Trace pleural fluid on the left. Electronically Signed   By: Monte Fantasia M.D.   On: 07/26/2020 04:53   DG Chest Port 1 View  Result Date: 07/25/2020 CLINICAL DATA:  70 year old male with shortness of breath. EXAM: PORTABLE CHEST 1 VIEW COMPARISON:  Chest radiograph dated 07/22/2020. FINDINGS: Background of emphysema. Bibasilar atelectasis/scarring. No focal consolidation, pleural effusion, or pneumothorax. The cardiac silhouette is within normal limits. No acute osseous pathology. IMPRESSION: No active disease. Electronically Signed   By: Anner Crete M.D.   On: 07/25/2020 23:28    EKG: Independently reviewed. Sinus, no st elevation  Assessment/Plan COPD exacerbation Acute on chronic hypoxic respiratory failure (chronic 2L [place] use w/ CPAP at night)     - admitted to inpt, progressive     - EDP initially concerned for PE; empirically placed heparin while awaiting CTA chest; it was negative for PE, stop heparin gtt     - he is moving good air with very minimal wheeze, but he's decreased at bases     - BNP and trp are negative, EKG is ok, there is no chest pain     - start him with nebs and PO steroids     - guiafenesin     - wean O2 as able  Constipation     - this is a chronic condition that has recently been exacerbated     - got an enema last night     - order another enema for today, start lactulose, continue home docusate  LLL lung cavitary lesion     - normal  [facility]m [facility]     - spoke with PCCM about this lesion; they will review case with the patient; can possibly get a bronch for Bx on Wednesday  DM2     - DM diet, SSI, A1c, glucose checks  Anxiety/Depression     - continue home meds  Morbid obesity OSA     - CPAP at night     - follow up with PCP on diet, lifestyle changes  HTN     - continue home regimen  DVT prophylaxis: heparin  Code Status: FULL  Family Communication: None at bedside  Consults called: None   Status is: Inpatient  Remains inpatient appropriate because:Inpatient level of care appropriate due to severity of illness   Dispo: The patient is from: Home              Anticipated d/c is to: Home              Patient currently is not medically stable to d/c.   Difficult to place patient No  Time spent coordinating admission: 70 minutes  [facility]  If 7PM-7AM, please contact night-coverage [email or web address]  07/26/2020, 8:07 AM

## 2020-07-26 NOTE — H&P (View-Only) (Signed)
NAME:  Mario [name], MRN:  [id number], DOB:  02/29/1951, LOS: 0 ADMISSION DATE:  07/25/2020, CONSULTATION DATE:  07/26/20 REFERRING MD:  Cherylann Ratel, DO CHIEF COMPLAINT:  Lung Mass  History of Present Illness:  Mario [name] is a 70 year old male, former smoker with COPD/emphysema, obstructive sleep apnea on CPAP with 2 L O2, obesity, hypertension and Uveitis/HLA-B27 positivity (on methotrexate and mycophenolate) who is admitted for abdominal pain due to constipation and COPD exacerbation.  PCCM has been consulted for evaluation of a left lower lobe cavitary mass.  Patient has about a 60-pack-year smoking history and quit in 2018 after he had a pneumothorax on the left.  He denies any cough or hemoptysis.  He denies any night sweats, chills, weight loss or loss of appetite.  No family history of lung cancer. He is followed by a pulmonologist at the [facility] clinic for his COPD and uses stiolto daily.   He reports not having a bowel movement over the past 2 weeks. He reports his breathing has worsened over the past week.   CT scans have been reviewed and he has an enlarging left lower lobe mass with cavitatry component first noted in 2018 and is currently 5.7cm.    We discussed the options of pursuing a biopsy of the lung lesion via navigational bronchoscopy and the patient is amenable to having that done while he is in the hospital.    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 Admitted 5/29  Interim History / Subjective:    Objective   Blood pressure 136/82, pulse 91, temperature 98 F (36.7 C), temperature source Oral, resp. rate 20, height _0  (1.88 m), weight (!) 154.2 kg, SpO2 95 %.        No intake or output data in the 24 hours ending 07/26/20 1029 Filed Weights   07/25/20 1944  Weight: (!) 154.2 kg    Examination: General: elderly male, obese, no acute distress HENT: [place]/AT, moist mucous membranes, sclera anicteric Lungs: Diminished breath sounds bilaterally.  No wheezing or rhonchi.  No rales. Cardiovascular: Regular rate and rhythm, no murmurs. Abdomen: Soft, nontender, nondistended, active bowel sounds present Extremities: Warm, no edema Neuro: Alert, oriented x3, moving all extremities. GU: Deferred  Labs/imaging that I have personally reviewed  (right click and "Reselect all SmartList Selections" daily)   CT Chest scans 09/17/16, 12/26/19, 04/03/20 and 07/26/20  - Centrilobular and panlobular emphysema. 5.7cm mass of left lower lobe near costophrenic sulcus with cavitary component. Initially was a thick rimmed cavitary lesion in 2018.   5/29: WBC 11.2k, Hgb 14.2, plts 269 Influenza and Covid negative.  UA unremarkable.  Cr 1.05, AST/ALT 34/43, total protein 7.5  Resolved Hospital Problem list     Assessment & Plan:   Left lower lobe cavitary lung mass Given patient's smoking history and age, he is high risk for lung cancer. Discussed case with Dr. Valeta Harms and we will work on scheduling him for navigational bronchoscopy on 6/1. The cavitary lesion is less likely inflammatory as HLA-B27 patients typically have fibrocystic changes of the apices.   - Patient is to be NPO night before the procedure - Hold DVT prophylaxis the morning of the procedure - Patient has not been taking any antiplatelet agents - The risks of the procedure  have been explained to him which include bleeding, pneumothorax and infection.   COPD with exacerbation - start $Remov'40mg'ujWHlp$  prednisone PO for 5 days and azithromycin for 5 days - start yupelri and brovana nebs  PCCM will continue to follow  Labs   CBC: Recent Labs  Lab 07/22/20 1550 07/25/20 2140 07/26/20 0448  WBC 11.5* 12.0*  11.2*  NEUTROABS 7.8* 6.9  --   HGB 15.0 15.1 14.2  HCT 44.8 45.0 43.1  MCV 97.4 98.7 98.2  PLT 270 276 536    Basic Metabolic Panel: Recent Labs  Lab 07/22/20 1550 07/25/20 2140  NA 135 135  K 4.4 4.5  CL 98 101  CO2 27 23  GLUCOSE 108* 111*  BUN 15 15  CREATININE 1.06 1.05  CALCIUM 9.1 9.3   GFR: Estimated Creatinine Clearance: 105.7 mL/min (by C-G formula based on SCr of 1.05 mg/dL). Recent Labs  Lab 07/22/20 1550 07/25/20 2140 07/26/20 0448  WBC 11.5* 12.0* 11.2*    Liver Function Tests: Recent Labs  Lab 07/22/20 1550 07/25/20 2140  AST 36 34  ALT 39 43  ALKPHOS 52 51  BILITOT 0.5 0.4  PROT 7.3 7.5  ALBUMIN 4.3 4.3   Recent Labs  Lab 07/25/20 2140  LIPASE 33   No results for input(s): AMMONIA in the last 168 hours.  ABG No results found for: PHART, PCO2ART, PO2ART, HCO3, TCO2, ACIDBASEDEF, O2SAT   Coagulation Profile: No results for input(s): INR, PROTIME in the last 168 hours.  Cardiac Enzymes: No results for input(s): CKTOTAL, CKMB, CKMBINDEX, TROPONINI in the last 168 hours.  HbA1C: Hgb A1c MFr Bld  Date/Time Value Ref Range Status  01/04/2020 04:45 PM 7.5 (H) 4.8 - 5.6 % Final    Comment:    (NOTE) Pre diabetes:          5.7%-6.4%  Diabetes:              >6.4%  Glycemic control for   <7.0% adults with diabetes   12/27/2019 03:04 AM 7.8 (H) 4.8 - 5.6 % Final    Comment:    (NOTE)         Prediabetes: 5.7 - 6.4         Diabetes: >6.4         Glycemic control for adults with diabetes: <7.0     CBG: No results for input(s): GLUCAP in the last 168 hours.  Review of Systems:   Review of Systems  Constitutional: Negative for chills, fever, malaise/fatigue and weight loss.  HENT: Negative for congestion, sinus pain and sore throat.   Eyes: Negative.   Respiratory: Positive for shortness of breath. Negative for cough, hemoptysis, sputum production and wheezing.   Cardiovascular: Negative for chest pain, palpitations, orthopnea,  claudication and leg swelling.  Gastrointestinal: Positive for abdominal pain and constipation. Negative for heartburn, nausea and vomiting.  Genitourinary: Negative.   Musculoskeletal: Negative for joint pain and myalgias.  Skin: Negative for rash.  Neurological: Positive for weakness.  Endo/Heme/Allergies: Negative.   Psychiatric/Behavioral: Negative.    Past Medical History:  He,  has a past medical history of Anxiety, Bronchitis, COPD (chronic obstructive pulmonary disease) ([place]), Depression, Hypertension, Mental disorder, MI (myocardial infarction) ([place]), OSA (obstructive sleep apnea), Suicide attempt ([place]), Tension pneumothorax (06/27/2016), and Uveitis.   Surgical History:   Past Surgical History:  Procedure Laterality Date  . CHEST TUBE INSERTION Left 06/27/2016  . cryptorchidism    . SKIN CANCER EXCISION       Social History:  reports that he quit smoking about 4 years ago. His smoking use included cigarettes. He has a 35.00 pack-year smoking history. He has never used smokeless tobacco. He reports that he does not drink alcohol and does not use drugs.   Family History:  His family history includes Dementia in his father.   Allergies Allergies  Allergen Reactions  . Demerol [Meperidine] Nausea And Vomiting and Other (See Comments)    Made the patient "violently sick"  . Zocor [Simvastatin] Nausea And Vomiting and Other (See Comments)    Made him very jittery, also  . Beet [Beta Vulgaris] Nausea And Vomiting  . Codeine     Other reaction(s): NAUSEA,VOMITING  . Liver Nausea And Vomiting     Home Medications  Prior to Admission medications   Medication Sig Start Date End Date Taking? Authorizing Provider  buPROPion (WELLBUTRIN XL) 300 MG 24 hr tablet Take 300 mg by mouth daily. 02/05/20  Yes [provider]  busPIRone (BUSPAR) 15 MG tablet Take 15 mg by mouth 2 (two) times daily.   Yes [provider]  Cholecalciferol (VITAMIN D-3) 25 MCG (1000 UT)  CAPS Take 1,000 Units by mouth daily.   Yes [provider]  cyclobenzaprine (FLEXERIL) 10 MG tablet Take 20 mg by mouth at bedtime.   Yes [provider]  cyclopentolate (CYCLODRYL,CYCLOGYL) 1 % ophthalmic solution Place 1 drop into the left eye 3 (three) times daily. 12/19/19  Yes [provider]  diltiazem (CARDIZEM CD) 180 MG 24 hr capsule Take 180 mg by mouth daily.   Yes [provider]  Docusate Sodium (DSS) 100 MG CAPS Take 100 mg by mouth daily as needed for constipation. 06/22/20  Yes [provider]  DULoxetine (CYMBALTA) 60 MG capsule Take 1 capsule (60 mg total) by mouth 2 (two) times daily. Patient taking differently: Take 60 mg by mouth 2 (two) times daily with a meal. 07/21/17  Yes Lavina Hamman, MD  folic acid (FOLVITE) 1 MG tablet Take 1 mg by mouth daily.   Yes [provider]  gabapentin (NEURONTIN) 300 MG capsule Take 900 mg by mouth at bedtime.   Yes [provider]  guaifenesin (HUMIBID E) 400 MG TABS tablet Take 400 mg by mouth 2 (two) times daily. 12/20/19  Yes [provider]  lisinopril (ZESTRIL) 20 MG tablet Take 20 mg by mouth daily.   Yes [provider]  lurasidone (LATUDA) 40 MG TABS tablet Take 40 mg by mouth daily after supper.   Yes [provider]  Melatonin 3 MG TABS Take 6 mg by mouth at bedtime.   Yes [provider]  metFORMIN (GLUCOPHAGE) 500 MG tablet Take 500 mg by mouth 2 (two) times daily with a meal.   Yes [provider]  methotrexate (RHEUMATREX) 2.5 MG tablet Take 6 tablets (15 mg total) by mouth every Saturday. Hold until seen by rheumatology  Caution:Chemotherapy. Protect from light. Patient taking differently: Take 15 mg by mouth every Saturday. 07/22/17  Yes Lavina Hamman, MD  Multiple Vitamin (MULTIVITAMIN WITH MINERALS) TABS tablet Take 1 tablet by mouth daily.   Yes [provider]  mycophenolate (CELLCEPT) 250 MG capsule Take  1,500 mg by mouth 2 (two) times daily.   Yes [provider]  naproxen (NAPROSYN) 500 MG tablet Take 500 mg by mouth 2 (two) times daily as needed for mild pain.   Yes [provider]  Olodaterol HCl (STRIVERDI RESPIMAT) 2.5 MCG/ACT AERS Inhale 2 puffs into  the lungs daily.   Yes [provider]  oxcarbazepine (TRILEPTAL) 600 MG tablet Take 600 mg by mouth 3 (three) times daily as needed for pain. 07/16/20  Yes [provider]  Polyethyl Glycol-Propyl Glycol 0.4-0.3 % SOLN Place 1 drop into both eyes in the morning, at noon, in the evening, and at bedtime. 07/13/20  Yes [provider]  Polyvinyl Alcohol-Povidone PF 1.4-0.6 % SOLN Place 1 drop into both eyes 4 (four) times daily as needed for dry eyes. 05/25/20  Yes [provider]  prednisoLONE acetate (PRED FORTE) 1 % ophthalmic suspension Apply 1 drop to eye as directed. Instill one drop in left eye every hour and follow taper schedule for pred forte for the right eye. 12/19/19  Yes [provider]  tamsulosin (FLOMAX) 0.4 MG CAPS capsule Take 0.4 mg by mouth daily.   Yes [provider]  traMADol (ULTRAM) 50 MG tablet Take 50 mg by mouth 2 (two) times daily.   Yes [provider]  traZODone (DESYREL) 50 MG tablet Take 75 mg by mouth at bedtime.   Yes [provider]  acetaminophen (TYLENOL) 500 MG tablet Take 1,000 mg by mouth every 6 (six) hours as needed for moderate pain.    [provider]  b complex vitamins capsule Take 1 capsule by mouth daily.    [provider]  calcium-vitamin D (OSCAL WITH D) 500-200 MG-UNIT tablet Take 1 tablet by mouth 2 (two) times daily with a meal.    [provider]  etanercept (ENBREL) 50 MG/ML injection Inject 50 mg into the skin every Wednesday.    [provider]  fluticasone (FLONASE) 50 MCG/ACT nasal spray Place 2 sprays into both nostrils daily.     [provider]   nitroGLYCERIN (NITROSTAT) 0.4 MG SL tablet Place 1 tablet (0.4 mg total) under the tongue every 5 (five) minutes as needed for chest pain. 07/03/19   Danford, Suann Larry, MD  OVER THE COUNTER MEDICATION Place 1 drop into both ears daily. Miracell ProEar for irritated itchy ears    [provider]  polyethylene glycol (MIRALAX / GLYCOLAX) 17 g packet Take 17 g by mouth daily.    [provider]  PRESCRIPTION MEDICATION Inhale into the lungs See admin instructions. CPAP- At bedtime and during during any naps    [provider]  Propylene Glycol (SYSTANE BALANCE) 0.6 % SOLN Place 1 drop into both eyes in the morning, at noon, and at bedtime.     [provider]  senna-docusate (SENOKOT-S) 8.6-50 MG tablet Take 1 tablet by mouth 2 (two) times daily. 04/07/20   Bonnielee Haff, MD  sodium chloride (OCEAN) 0.65 % SOLN nasal spray Place 2 sprays into both nostrils 4 (four) times daily as needed for congestion.    [provider]  Tiotropium Bromide-Olodaterol (STIOLTO RESPIMAT) 2.5-2.5 MCG/ACT AERS Inhale 2 puffs into the lungs daily.    [provider]  VITAMIN A PO Take 1 tablet by mouth daily.    [provider]  vitamin C (ASCORBIC ACID) 500 MG tablet Take 500 mg by mouth 2 (two) times daily.     [provider]     Critical care time: n/a    Freda Jackson, MD [facility] Office: [phone]   See Amion for personal pager PCCM on call pager [phone] until 7pm. Please call Elink 7p-7a. [phone]

## 2020-07-26 NOTE — ED Notes (Signed)
Per CT, patient's IV location is not satisfactory for scan. IV team consult placed.

## 2020-07-26 NOTE — Progress Notes (Signed)
ANTICOAGULATION CONSULT NOTE - Initial Consult  Pharmacy Consult for heparin Indication: pulmonary embolus  Allergies  Allergen Reactions  . Demerol [Meperidine] Nausea And Vomiting and Other (See Comments)    Made the patient "violently sick"  . Zocor [Simvastatin] Nausea And Vomiting and Other (See Comments)    Made him very jittery, also  . Beet [Beta Vulgaris] Nausea And Vomiting  . Liver Nausea And Vomiting    Patient Measurements: Height: 6\' 2"  (188 cm) Weight: (!) 154.2 kg (340 lb) IBW/kg (Calculated) : 82.2 Heparin Dosing Weight: 118kg  Vital Signs: Temp: 98 F (36.7 C) (05/28 2323) Temp Source: Oral (05/28 2323) BP: 150/95 (05/29 0130) Pulse Rate: 96 (05/29 0130)  Labs: Recent Labs    07/25/20 2140 07/26/20 0233  HGB 15.1  --   HCT 45.0  --   PLT 276  --   CREATININE 1.05  --   TROPONINIHS 4 5    Estimated Creatinine Clearance: 105.7 mL/min (by C-G formula based on SCr of 1.05 mg/dL).   Medical History: Past Medical History:  Diagnosis Date  . Anxiety   . Bronchitis   . COPD (chronic obstructive pulmonary disease) ([place])   . Depression   . Hypertension   . Mental disorder   . MI (myocardial infarction) ([place])    ????  . OSA (obstructive sleep apnea)   . Suicide attempt ([place])   . Tension pneumothorax 06/27/2016  . Uveitis      Assessment: 70 yo male with increased SOB found to be tachypnic and tachycardic. CT scan of the abdomen pelvis concerning for a lung mass.  Pharmacy consulted to dose heparin for PE.  No prior AC noted  07/26/2020  CBC WNL Scr 1.05  Goal of Therapy:  Heparin level 0.3-0.7 units/ml Monitor platelets by anticoagulation protocol:   Plan:  Heparin bolus 5000 units x 1 Start heparin drip at 1900 units/hr Heparin level in 6 hours Daily CBC   [name] RPh 07/26/2020, 3:31 AM

## 2020-07-27 DIAGNOSIS — J9601 Acute respiratory failure with hypoxia: Secondary | ICD-10-CM

## 2020-07-27 LAB — COMPREHENSIVE METABOLIC PANEL
ALT: 37 U/L (ref 0–44)
AST: 28 U/L (ref 15–41)
Albumin: 3.9 g/dL (ref 3.5–5.0)
Alkaline Phosphatase: 49 U/L (ref 38–126)
Anion gap: 8 (ref 5–15)
BUN: 15 mg/dL (ref 8–23)
CO2: 26 mmol/L (ref 22–32)
Calcium: 8.9 mg/dL (ref 8.9–10.3)
Chloride: 105 mmol/L (ref 98–111)
Creatinine, Ser: 1.07 mg/dL (ref 0.61–1.24)
GFR, Estimated: >60 mL/min (ref >60)
Glucose, Bld: 125 mg/dL — ABNORMAL HIGH (ref 70–99)
Potassium: 4 mmol/L (ref 3.5–5.1)
Sodium: 139 mmol/L (ref 135–145)
Total Bilirubin: 0.4 mg/dL (ref 0.3–1.2)
Total Protein: 6.9 g/dL (ref 6.5–8.1)

## 2020-07-27 LAB — CBC
HCT: 43.9 % (ref 39.0–52.0)
Hemoglobin: 14.5 g/dL (ref 13.0–17.0)
MCH: 32.6 pg (ref 26.0–34.0)
MCHC: 33 g/dL (ref 30.0–36.0)
MCV: 98.7 fL (ref 80.0–100.0)
Platelets: 263 10*3/uL (ref 150–400)
RBC: 4.45 MIL/uL (ref 4.22–5.81)
RDW: 13.2 % (ref 11.5–15.5)
WBC: 7.5 10*3/uL (ref 4.0–10.5)
nRBC: 0 % (ref 0.0–0.2)

## 2020-07-27 LAB — GLUCOSE, CAPILLARY
Glucose-Capillary: 134 mg/dL — ABNORMAL HIGH (ref 70–99)
Glucose-Capillary: 148 mg/dL — ABNORMAL HIGH (ref 70–99)
Glucose-Capillary: 124 mg/dL — ABNORMAL HIGH (ref 70–99)
Glucose-Capillary: 146 mg/dL — ABNORMAL HIGH (ref 70–99)

## 2020-07-27 MED ORDER — CYCLOBENZAPRINE HCL 10 MG PO TABS
20.0000 mg | ORAL_TABLET | Freq: Every day | ORAL | Status: DC
  Administered 2020-07-27: 20 mg via ORAL
  Filled 2020-07-27: qty 2

## 2020-07-27 MED ORDER — TRAMADOL HCL 50 MG PO TABS
100.0000 mg | ORAL_TABLET | Freq: Every day | ORAL | Status: DC
  Filled 2020-07-27: qty 2

## 2020-07-27 MED ORDER — LISINOPRIL 20 MG PO TABS
20.0000 mg | ORAL_TABLET | Freq: Every day | ORAL | Status: DC
  Administered 2020-07-27 – 2020-08-05 (×10): 20 mg via ORAL
  Filled 2020-07-27 (×10): qty 1

## 2020-07-27 MED ORDER — DILTIAZEM HCL ER COATED BEADS 180 MG PO CP24
180.0000 mg | ORAL_CAPSULE | Freq: Every day | ORAL | Status: DC
  Administered 2020-07-27 – 2020-08-05 (×10): 180 mg via ORAL
  Filled 2020-07-27 (×10): qty 1

## 2020-07-27 MED ORDER — POLYETHYLENE GLYCOL 3350 17 G PO PACK
17.0000 g | PACK | Freq: Every day | ORAL | Status: DC
  Administered 2020-07-27 – 2020-07-31 (×5): 17 g via ORAL
  Filled 2020-07-27 (×5): qty 1

## 2020-07-27 MED ORDER — B COMPLEX VITAMINS PO CAPS: 1.0000 | ORAL_CAPSULE | Freq: Every day | ORAL | Status: DC

## 2020-07-27 MED ORDER — ASPIRIN EC 81 MG PO TBEC
81.0000 mg | DELAYED_RELEASE_TABLET | Freq: Every day | ORAL | Status: DC
  Administered 2020-07-27 – 2020-08-05 (×10): 81 mg via ORAL
  Filled 2020-07-27 (×10): qty 1

## 2020-07-27 MED ORDER — TRAMADOL HCL 50 MG PO TABS: 100.0000 mg | ORAL_TABLET | Freq: Every day | ORAL | Status: DC

## 2020-07-27 MED ORDER — KETOROLAC TROMETHAMINE 15 MG/ML IJ SOLN
15.0000 mg | Freq: Three times a day (TID) | INTRAMUSCULAR | Status: AC | PRN
  Administered 2020-07-27 – 2020-07-28 (×3): 15 mg via INTRAVENOUS
  Filled 2020-07-27 (×4): qty 1

## 2020-07-27 MED ORDER — GABAPENTIN 300 MG PO CAPS
900.0000 mg | ORAL_CAPSULE | Freq: Every day | ORAL | Status: DC
  Administered 2020-07-27 – 2020-08-04 (×9): 900 mg via ORAL
  Filled 2020-07-27 (×9): qty 3

## 2020-07-27 MED ORDER — DOCUSATE SODIUM 100 MG PO CAPS: 100.0000 mg | ORAL_CAPSULE | Freq: Every day | ORAL | Status: DC | PRN

## 2020-07-27 MED ORDER — VITAMIN D3 25 MCG (1000 UNIT) PO TABS
1000.0000 [IU] | ORAL_TABLET | Freq: Every day | ORAL | Status: DC
  Administered 2020-07-27 – 2020-07-30 (×4): 1000 [IU] via ORAL
  Filled 2020-07-27 (×4): qty 1

## 2020-07-27 MED ORDER — ADULT MULTIVITAMIN W/MINERALS CH
1.0000 | ORAL_TABLET | Freq: Every day | ORAL | Status: DC
  Administered 2020-07-27 – 2020-08-05 (×10): 1 via ORAL
  Filled 2020-07-27 (×10): qty 1

## 2020-07-27 MED ORDER — FOLIC ACID 1 MG PO TABS
1.0000 mg | ORAL_TABLET | Freq: Every day | ORAL | Status: DC
  Administered 2020-07-27 – 2020-08-05 (×10): 1 mg via ORAL
  Filled 2020-07-27 (×10): qty 1

## 2020-07-27 MED ORDER — NITROGLYCERIN 0.4 MG SL SUBL: 0.4000 mg | SUBLINGUAL_TABLET | SUBLINGUAL | Status: DC | PRN

## 2020-07-27 MED ORDER — ASCORBIC ACID 500 MG PO TABS
500.0000 mg | ORAL_TABLET | Freq: Two times a day (BID) | ORAL | Status: DC
  Administered 2020-07-27 – 2020-07-30 (×7): 500 mg via ORAL
  Filled 2020-07-27 (×7): qty 1

## 2020-07-27 MED ORDER — CALCIUM CARBONATE-VITAMIN D 500-200 MG-UNIT PO TABS
1.0000 | ORAL_TABLET | Freq: Two times a day (BID) | ORAL | Status: DC
  Administered 2020-07-27 – 2020-07-30 (×6): 1 via ORAL
  Filled 2020-07-27 (×6): qty 1

## 2020-07-27 MED ORDER — MYCOPHENOLATE MOFETIL 250 MG PO CAPS
1500.0000 mg | ORAL_CAPSULE | Freq: Two times a day (BID) | ORAL | Status: DC
  Administered 2020-07-27 – 2020-08-05 (×19): 1500 mg via ORAL
  Filled 2020-07-27 (×19): qty 6

## 2020-07-27 MED ORDER — SENNOSIDES-DOCUSATE SODIUM 8.6-50 MG PO TABS
1.0000 | ORAL_TABLET | Freq: Two times a day (BID) | ORAL | Status: DC
  Administered 2020-07-27 – 2020-08-01 (×12): 1 via ORAL
  Filled 2020-07-27 (×13): qty 1

## 2020-07-27 MED ORDER — B COMPLEX-C PO TABS
1.0000 | ORAL_TABLET | Freq: Every day | ORAL | Status: DC
  Administered 2020-07-27 – 2020-07-30 (×4): 1 via ORAL
  Filled 2020-07-27 (×4): qty 1

## 2020-07-27 MED ORDER — OXCARBAZEPINE 300 MG PO TABS
600.0000 mg | ORAL_TABLET | Freq: Three times a day (TID) | ORAL | Status: DC
  Administered 2020-07-27 – 2020-08-05 (×28): 600 mg via ORAL
  Filled 2020-07-27 (×30): qty 2

## 2020-07-27 MED ORDER — PHENOL 1.4 % MT LIQD
1.0000 | OROMUCOSAL | Status: DC | PRN
  Filled 2020-07-27: qty 177

## 2020-07-27 MED ORDER — TRAMADOL HCL 50 MG PO TABS
100.0000 mg | ORAL_TABLET | Freq: Every day | ORAL | Status: DC
  Administered 2020-07-27 – 2020-08-04 (×10): 100 mg via ORAL
  Filled 2020-07-27 (×9): qty 2

## 2020-07-27 MED ORDER — POLYVINYL ALCOHOL 1.4 % OP SOLN
1.0000 [drp] | OPHTHALMIC | Status: DC | PRN
  Administered 2020-07-27 – 2020-08-04 (×4): 1 [drp] via OPHTHALMIC
  Filled 2020-07-27: qty 15

## 2020-07-27 MED ORDER — TAMSULOSIN HCL 0.4 MG PO CAPS
0.4000 mg | ORAL_CAPSULE | Freq: Every day | ORAL | Status: DC
  Administered 2020-07-27 – 2020-08-05 (×10): 0.4 mg via ORAL
  Filled 2020-07-27 (×9): qty 1

## 2020-07-27 NOTE — Progress Notes (Signed)
PROGRESS NOTE    Mario [name]  ZOX:[id number] DOB: 04/04/1950 DOA: 07/25/2020 PCP: Clinic, Mario [name]   Brief Narrative:  HPI: Mario [name] is a 70 y.o. male with medical history significant of COPD, HTN, anxiety, DM. Presenting with constipation and shortness of breath. He reports that he's not had a BM in 2 weeks. He chronically takes docusate and miralax. He added mag citrate a couple days ago, and he still hasn't had a BM. He has some global abdominal discomfort. In addition, he has had an increase in shortness of breath over the last few days. He is on CPAP at night w/ 2L [place]. He otherwise normally uses his O2 in a PRN fashion during the day. He has found himself more short of breath whether resting or with activity over the last few days. He has no fever, increased cough, increased peripheral edema, or CP. He became concerned with his symptoms and decided to come to the ED. He denies any other aggravating or alleviating factors.   ED Course: CT ab/pelvis shows significant stool retention. CTA chest was negative for PE but did show a LLL opacity w/ cavitation that is concerning for malignancy. [facility] was called for admission.    Assessment & Plan:   Active Problems:   Acute respiratory failure with hypoxemia (HCC)  Acute on chronic hypoxic respiratory failure secondary to acute COPD exacerbation (chronic 2L [place] use w/ CPAP at night): Still feels shortness of breath with minimal exertion, going to the bathroom.  On 2 L of oxygen which he usually uses at night only.  Expiratory wheezes.  Continue steroids, bronchodilators and antibiotics.  Constipation: this is a chronic condition that has recently been exacerbated.  Has had 1 bowel movement.  Continue current regimen.  LLL lung cavitary lesion: normal follow up with pulm at [facility].  PCCM on board and he is tentatively scheduled for bronchoscopy with biopsy on Wednesday.  DM2: Recent hemoglobin A1c 7.5 last year.  Blood sugar  controlled.  Continue SSI.  Anxiety/Depression: Controlled.     - continue home meds  Morbid obesity OSA     - CPAP at night     - follow up with PCP on diet, lifestyle changes  HTN: Controlled.  Continue current regimen which is diltiazem and lisinopril.  Headache: Did not resolve with Tylenol, requesting more medication.  We will try Toradol.  DVT prophylaxis: heparin injection 5,000 Units Start: 07/26/20 2200   Code Status: Full Code  Family Communication:  None present at bedside.  Plan of care discussed with patient in length and he verbalized understanding and agreed with it.  Status is: Inpatient  Remains inpatient appropriate because:Ongoing diagnostic testing needed not appropriate for outpatient work up   Dispo: The patient is from: Home              Anticipated d/c is to: Home              Patient currently is not medically stable to d/c.   Difficult to place patient No        Estimated body mass index is 44.01 kg/m as calculated from the following:   Height as of this encounter: 6\' 2"  (1.88 m).   Weight as of this encounter: 155.5 kg.      Nutritional status:               Consultants:   PCCM  Procedures:   None  Antimicrobials:  Anti-infectives (From admission, onward)   Start  Dose/Rate Route Frequency Ordered Stop   07/27/20 1000  azithromycin (ZITHROMAX) tablet 250 mg       "Followed by" Linked Group Details   250 mg Oral Daily 07/26/20 1123 07/31/20 0959   07/26/20 1130  azithromycin (ZITHROMAX) tablet 500 mg       "Followed by" Linked Group Details   500 mg Oral Daily 07/26/20 1123 07/26/20 1335         Subjective: Seen and examined.  He states that he still feels shortness of breath, just like yesterday with no improvement.  No other complaint.  Objective: Vitals:   07/27/20 0310 07/27/20 0737 07/27/20 1052 07/27/20 1154  BP: 137/85  (!) 122/55 (!) 150/95  Pulse: 94   (!) 101  Resp: 18   20  Temp: 97.8 F  (36.6 C)   (!) 97.5 F (36.4 C)  TempSrc: Oral   Oral  SpO2: 91% 95%  95%  Weight:      Height:        Intake/Output Summary (Last 24 hours) at 07/27/2020 1323 Last data filed at 07/27/2020 0900 Gross per 24 hour  Intake 1320 ml  Output 2850 ml  Net -1530 ml   Filed Weights   07/25/20 1944 07/26/20 1445  Weight: (!) 154.2 kg (!) 155.5 kg    Examination:  General exam: Appears calm and comfortable, obese Respiratory system: Diffuse expiratory wheezes bilaterally. Respiratory effort normal. Cardiovascular system: S1 & S2 heard, RRR. No JVD, murmurs, rubs, gallops or clicks. No pedal edema. Gastrointestinal system: Abdomen is nondistended, soft and nontender. No organomegaly or masses felt. Normal bowel sounds heard. Central nervous system: Alert and oriented. No focal neurological deficits. Extremities: Symmetric 5 x 5 power. Skin: No rashes, lesions or ulcers Psychiatry: Judgement and insight appear normal. Mood & affect appropriate.    Data Reviewed: I have personally reviewed following labs and imaging studies  CBC: Recent Labs  Lab 07/22/20 1550 07/25/20 2140 07/26/20 0448 07/26/20 1620 07/27/20 0505  WBC 11.5* 12.0* 11.2* 8.6 7.5  NEUTROABS 7.8* 6.9  --   --   --   HGB 15.0 15.1 14.2 14.3 14.5  HCT 44.8 45.0 43.1 42.9 43.9  MCV 97.4 98.7 98.2 98.2 98.7  PLT 270 276 269 258 161   Basic Metabolic Panel: Recent Labs  Lab 07/22/20 1550 07/25/20 2140 07/26/20 1620 07/27/20 0505  NA 135 135  --  139  K 4.4 4.5  --  4.0  CL 98 101  --  105  CO2 27 23  --  26  GLUCOSE 108* 111*  --  125*  BUN 15 15  --  15  CREATININE 1.06 1.05 1.28* 1.07  CALCIUM 9.1 9.3  --  8.9   GFR: Estimated Creatinine Clearance: 104.2 mL/min (by C-G formula based on SCr of 1.07 mg/dL). Liver Function Tests: Recent Labs  Lab 07/22/20 1550 07/25/20 2140 07/27/20 0505  AST 36 34 28  ALT 39 43 37  ALKPHOS 52 51 49  BILITOT 0.5 0.4 0.4  PROT 7.3 7.5 6.9  ALBUMIN 4.3 4.3 3.9    Recent Labs  Lab 07/25/20 2140  LIPASE 33   No results for input(s): AMMONIA in the last 168 hours. Coagulation Profile: No results for input(s): INR, PROTIME in the last 168 hours. Cardiac Enzymes: No results for input(s): CKTOTAL, CKMB, CKMBINDEX, TROPONINI in the last 168 hours. BNP (last 3 results) No results for input(s): PROBNP in the last 8760 hours. HbA1C: No results for input(s): HGBA1C in the  last 72 hours. CBG: Recent Labs  Lab 07/26/20 1703 07/26/20 2217 07/27/20 0731 07/27/20 1151  GLUCAP 114* 129* 134* 148*   Lipid Profile: No results for input(s): CHOL, HDL, LDLCALC, TRIG, CHOLHDL, LDLDIRECT in the last 72 hours. Thyroid Function Tests: No results for input(s): TSH, T4TOTAL, FREET4, T3FREE, THYROIDAB in the last 72 hours. Anemia Panel: No results for input(s): VITAMINB12, FOLATE, FERRITIN, TIBC, IRON, RETICCTPCT in the last 72 hours. Sepsis Labs: No results for input(s): PROCALCITON, LATICACIDVEN in the last 168 hours.  Recent Results (from the past 240 hour(s))  Resp Panel by RT-PCR (Flu A&B, Covid) Nasopharyngeal Swab     Status: None   Collection Time: 07/26/20  2:33 AM   Specimen: Nasopharyngeal Swab; Nasopharyngeal(NP) swabs in vial transport medium  Result Value Ref Range Status   SARS Coronavirus 2 by RT PCR NEGATIVE NEGATIVE Final    Comment: (NOTE) SARS-CoV-2 target nucleic acids are NOT DETECTED.  The SARS-CoV-2 RNA is generally detectable in upper respiratory specimens during the acute phase of infection. The lowest concentration of SARS-CoV-2 viral copies this assay can detect is 138 copies/mL. A negative result does not preclude SARS-Cov-2 infection and should not be used as the sole basis for treatment or other patient management decisions. A negative result may occur with  improper specimen collection/handling, submission of specimen other than nasopharyngeal swab, presence of viral mutation(s) within the areas targeted by this assay,  and inadequate number of viral copies(<138 copies/mL). A negative result must be combined with clinical observations, patient history, and epidemiological information. The expected result is Negative.  Fact Sheet for Patients:  EntrepreneurPulse.com.au  Fact Sheet for Healthcare Providers:  IncredibleEmployment.be  This test is no t yet approved or cleared by the [place] FDA and  has been authorized for detection and/or diagnosis of SARS-CoV-2 by FDA under an Emergency Use Authorization (EUA). This EUA will remain  in effect (meaning this test can be used) for the duration of the COVID-19 declaration under Section 564(b)(1) of the Act, 21 U.S.C.section 360bbb-3(b)(1), unless the authorization is terminated  or revoked sooner.       Influenza A by PCR NEGATIVE NEGATIVE Final   Influenza B by PCR NEGATIVE NEGATIVE Final    Comment: (NOTE) The Xpert Xpress SARS-CoV-2/FLU/RSV plus assay is intended as an aid in the diagnosis of influenza from Nasopharyngeal swab specimens and should not be used as a sole basis for treatment. Nasal washings and aspirates are unacceptable for Xpert Xpress SARS-CoV-2/FLU/RSV testing.  Fact Sheet for Patients: EntrepreneurPulse.com.au  Fact Sheet for Healthcare Providers: IncredibleEmployment.be  This test is not yet approved or cleared by the [place] FDA and has been authorized for detection and/or diagnosis of SARS-CoV-2 by FDA under an Emergency Use Authorization (EUA). This EUA will remain in effect (meaning this test can be used) for the duration of the COVID-19 declaration under Section 564(b)(1) of the Act, 21 U.S.C. section 360bbb-3(b)(1), unless the authorization is terminated or revoked.  Performed at [facility], [place] [address], [place], [place] [address]       Radiology Studies: CT ABDOMEN PELVIS WO CONTRAST  Result Date:  07/25/2020 CLINICAL DATA:  Constipation for 10 days, abdominal pain EXAM: CT ABDOMEN AND PELVIS WITHOUT CONTRAST TECHNIQUE: Multidetector CT imaging of the abdomen and pelvis was performed following the standard protocol without IV contrast. COMPARISON:  04/03/2020, 12/26/2019 FINDINGS: Lower chest: Bullous emphysematous changes are again seen at the lung bases. Trace left pleural effusion again identified, slightly increased in size  since prior study. The somewhat spiculated area of subpleural consolidation within the left lower lobe on prior studies is again identified, now measuring approximately 4.6 x 3.8 x 3.5 cm, increased since 12/26/2019 with this area measured approximately 3.8 x 3.2 x 2.0 cm with less masslike character. Underlying neoplasm cannot be excluded, and follow-up PET CT may be useful. Hepatobiliary: Unenhanced imaging of the liver and gallbladder demonstrates no focal abnormalities. No evidence of cholelithiasis or cholecystitis. Hepatic steatosis again noted. Pancreas: Unremarkable. No pancreatic ductal dilatation or surrounding inflammatory changes. Spleen: Normal in size without focal abnormality. Adrenals/Urinary Tract: No urinary tract calculi or obstructive uropathy. Bilateral renal hypodensities are noted compatible with cortical cysts. The adrenals are unremarkable. Bladder is minimally distended with no focal abnormality. Stomach/Bowel: No bowel obstruction or ileus. Sigmoid diverticulosis without diverticulitis. There is significant retained stool throughout the colon consistent with given history of constipation. No bowel wall thickening or inflammatory change. Vascular/Lymphatic: Aortic atherosclerosis. No enlarged abdominal or pelvic lymph nodes. Reproductive: Prostate is unremarkable. Other: No free fluid or free intraperitoneal gas. Small fat containing left inguinal hernia unchanged. No bowel herniation. Musculoskeletal: No acute or destructive bony lesions. Reconstructed images  demonstrate no additional findings. IMPRESSION: 1. Significant fecal retention consistent with history of constipation. No bowel obstruction or ileus. 2. increase in size of the subpleural left lower lobe consolidation, with increasing masslike character in comparison to 12/24/2019 exam. Neoplasm cannot be excluded, and follow-up PET CT is recommended. 3. Sigmoid diverticulosis without diverticulitis. 4. Trace left pleural effusion, slightly increased in size since prior study. 5. Hepatic steatosis. 6. Aortic Atherosclerosis (ICD10-I70.0) and Emphysema (ICD10-J43.9). Electronically Signed   By: Randa Ngo M.D.   On: 07/25/2020 21:29   CT Angio Chest PE W and/or Wo Contrast  Result Date: 07/26/2020 CLINICAL DATA:  Hypoxia.  Pulmonary embolism suspected EXAM: CT ANGIOGRAPHY CHEST WITH CONTRAST TECHNIQUE: Multidetector CT imaging of the chest was performed using the standard protocol during bolus administration of intravenous contrast. Multiplanar CT image reconstructions and MIPs were obtained to evaluate the vascular anatomy. CONTRAST:  126mL OMNIPAQUE IOHEXOL 350 MG/ML SOLN COMPARISON:  04/03/2020 FINDINGS: Cardiovascular: Satisfactory opacification of the pulmonary arteries to the segmental level. No evidence of pulmonary embolism when allowing for levels of obscuration due to motion artifact. Normal heart size. No pericardial effusion. Aortic and coronary atherosclerosis. Mediastinum/Nodes: Negative for adenopathy or mass. Lungs/Pleura: Centrilobular and panlobular emphysema. Chronic opacity with cavitary features the left costophrenic sulcus laterally, up to 5.7 cm. Dimensions are mildly increased from 2297 and certainly increased from 2018. 7 mm pulmonary nodule in the left upper lobe is stable from 2021. Upper Abdomen: No acute finding Musculoskeletal: Generalized spondylitic spurring with multilevel bridging. No acute finding. Review of the MIP images confirms the above findings. IMPRESSION: 1. No  evidence of pulmonary embolism. Moderate artifact due to respiratory motion. 2. Chronic airspace opacity with cavitation in the left lower lobe, gradually enlarging since 2018. An adenocarcinoma could have this appearance, recommend specific follow-up. 3. Emphysema and pulmonary scarring. Trace pleural fluid on the left. Electronically Signed   By: Monte Fantasia M.D.   On: 07/26/2020 04:53   DG Chest Port 1 View  Result Date: 07/25/2020 CLINICAL DATA:  70 year old male with shortness of breath. EXAM: PORTABLE CHEST 1 VIEW COMPARISON:  Chest radiograph dated 07/22/2020. FINDINGS: Background of emphysema. Bibasilar atelectasis/scarring. No focal consolidation, pleural effusion, or pneumothorax. The cardiac silhouette is within normal limits. No acute osseous pathology. IMPRESSION: No active disease. Electronically Signed   By:  Anner Crete M.D.   On: 07/25/2020 23:28    Scheduled Meds: . arformoterol  15 mcg Nebulization BID  . vitamin C  500 mg Oral BID  . azithromycin  250 mg Oral Daily  . B-complex with vitamin C  1 tablet Oral Daily  . buPROPion  300 mg Oral Daily  . busPIRone  15 mg Oral BID  . calcium-vitamin D  1 tablet Oral BID WC  . cholecalciferol  1,000 Units Oral Daily  . cyclobenzaprine  20 mg Oral QHS  . diltiazem  180 mg Oral Daily  . docusate sodium  100 mg Oral BID  . DULoxetine  60 mg Oral BID  . folic acid  1 mg Oral Daily  . gabapentin  900 mg Oral QHS  . heparin  5,000 Units Subcutaneous Q8H  . insulin aspart  0-15 Units Subcutaneous TID WC  . insulin aspart  0-5 Units Subcutaneous QHS  . lactulose  10 g Oral BID  . lisinopril  20 mg Oral Daily  . melatonin  6 mg Oral QHS  . multivitamin with minerals  1 tablet Oral Daily  . mycophenolate  1,500 mg Oral BID  . oxcarbazepine  600 mg Oral TID  . polyethylene glycol  17 g Oral Daily  . predniSONE  40 mg Oral Q breakfast  . revefenacin  175 mcg Nebulization Daily  . senna-docusate  1 tablet Oral BID  .  tamsulosin  0.4 mg Oral Daily  . traMADol  100 mg Oral QHS  . traZODone  75 mg Oral QHS   Continuous Infusions:   LOS: 1 day   Time spent: 35 minutes   Darliss Cheney, MD [facility]  07/27/2020, 1:23 PM   How to contact the [facility] Attending or Consulting provider [place] or covering provider during after hours [place], for this patient?  1. Check the care team in [facility] and look for a) attending/consulting [facility] provider listed and b) the [facility] team listed. Page or secure chat [phone]. 2. Log into [email or web address] and use [place]'s universal password to access. If you do not have the password, please contact the hospital operator. 3. Locate the [facility] provider you are looking for under [facility] and page to a number that you can be directly reached. 4. If you still have difficulty reaching the provider, please page the [facility] (Director on Call) for the Hospitalists listed on amion for assistance.

## 2020-07-28 LAB — BASIC METABOLIC PANEL
Anion gap: 9 (ref 5–15)
BUN: 22 mg/dL (ref 8–23)
CO2: 24 mmol/L (ref 22–32)
Calcium: 9 mg/dL (ref 8.9–10.3)
Chloride: 104 mmol/L (ref 98–111)
Creatinine, Ser: 1.11 mg/dL (ref 0.61–1.24)
GFR, Estimated: >60 mL/min (ref >60)
Glucose, Bld: 128 mg/dL — ABNORMAL HIGH (ref 70–99)
Potassium: 4.2 mmol/L (ref 3.5–5.1)
Sodium: 137 mmol/L (ref 135–145)

## 2020-07-28 LAB — CBC WITH DIFFERENTIAL/PLATELET
Abs Immature Granulocytes: 0.04 10*3/uL (ref 0.00–0.07)
Basophils Absolute: 0.1 10*3/uL (ref 0.0–0.1)
Basophils Relative: 0 %
Eosinophils Absolute: 0.2 10*3/uL (ref 0.0–0.5)
Eosinophils Relative: 2 %
HCT: 46.1 % (ref 39.0–52.0)
Hemoglobin: 15 g/dL (ref 13.0–17.0)
Immature Granulocytes: 0 %
Lymphocytes Relative: 33 %
Lymphs Abs: 3.8 10*3/uL (ref 0.7–4.0)
MCH: 32.2 pg (ref 26.0–34.0)
MCHC: 32.5 g/dL (ref 30.0–36.0)
MCV: 98.9 fL (ref 80.0–100.0)
Monocytes Absolute: 1.2 10*3/uL — ABNORMAL HIGH (ref 0.1–1.0)
Monocytes Relative: 10 %
Neutro Abs: 6.4 10*3/uL (ref 1.7–7.7)
Neutrophils Relative %: 55 %
Platelets: 281 10*3/uL (ref 150–400)
RBC: 4.66 MIL/uL (ref 4.22–5.81)
RDW: 13.3 % (ref 11.5–15.5)
WBC: 11.5 10*3/uL — ABNORMAL HIGH (ref 4.0–10.5)
nRBC: 0 % (ref 0.0–0.2)

## 2020-07-28 LAB — GLUCOSE, CAPILLARY
Glucose-Capillary: 121 mg/dL — ABNORMAL HIGH (ref 70–99)
Glucose-Capillary: 159 mg/dL — ABNORMAL HIGH (ref 70–99)
Glucose-Capillary: 181 mg/dL — ABNORMAL HIGH (ref 70–99)
Glucose-Capillary: 178 mg/dL — ABNORMAL HIGH (ref 70–99)

## 2020-07-28 LAB — HEMOGLOBIN A1C
Hgb A1c MFr Bld: 6.9 % — ABNORMAL HIGH (ref 4.8–5.6)
Mean Plasma Glucose: 151 mg/dL

## 2020-07-28 LAB — MAGNESIUM: Magnesium: 2.5 mg/dL — ABNORMAL HIGH (ref 1.7–2.4)

## 2020-07-28 MED ORDER — CYCLOBENZAPRINE HCL 10 MG PO TABS: 10.0000 mg | ORAL_TABLET | Freq: Three times a day (TID) | ORAL | Status: DC | PRN

## 2020-07-28 MED ORDER — IPRATROPIUM-ALBUTEROL 0.5-2.5 (3) MG/3ML IN SOLN
3.0000 mL | Freq: Four times a day (QID) | RESPIRATORY_TRACT | Status: DC | PRN
  Administered 2020-07-29: 3 mL via RESPIRATORY_TRACT

## 2020-07-28 MED ORDER — CYCLOBENZAPRINE HCL 10 MG PO TABS
10.0000 mg | ORAL_TABLET | Freq: Three times a day (TID) | ORAL | Status: DC | PRN
  Administered 2020-07-28 – 2020-08-04 (×8): 10 mg via ORAL
  Filled 2020-07-28 (×8): qty 1

## 2020-07-28 MED ORDER — CYCLOBENZAPRINE HCL 10 MG PO TABS
20.0000 mg | ORAL_TABLET | Freq: Every day | ORAL | Status: DC
  Administered 2020-07-28 – 2020-08-04 (×8): 20 mg via ORAL
  Filled 2020-07-28 (×9): qty 2

## 2020-07-28 MED ORDER — CYCLOBENZAPRINE HCL 10 MG PO TABS
10.0000 mg | ORAL_TABLET | ORAL | Status: AC
  Administered 2020-07-28: 10 mg via ORAL
  Filled 2020-07-28: qty 1

## 2020-07-28 MED ORDER — LURASIDONE HCL 40 MG PO TABS
40.0000 mg | ORAL_TABLET | Freq: Every day | ORAL | Status: DC
  Administered 2020-07-28 – 2020-08-04 (×8): 40 mg via ORAL
  Filled 2020-07-28 (×9): qty 1

## 2020-07-28 NOTE — Progress Notes (Signed)
Pt scheduled for navigational bronchoscopy with Dr. Valeta Harms on 6/2 at 0730.  [facility] set up to have patient arrive to short stay at 0615. Anderson Malta, bedside RN, aware.  Vista Lawman, RN

## 2020-07-28 NOTE — Progress Notes (Signed)
PROGRESS NOTE    Mario [name]  GUR:[id number] DOB: [birth year] DOA: 07/25/2020 PCP: [facility]   Brief Narrative:  Mario [name] is a 70 y.o. male with medical history significant of COPD, HTN, anxiety, DM. Presented with constipation and shortness of breath. he's not had a BM in 2 weeks. He chronically takes docusate and miralax. He added mag citrate a couple days ago, and he still hasn't had a BM. In addition, he has had an increase in shortness of breath over the last few days. He is on CPAP at night w/ 2L [name]. He otherwise normally uses his O2 in a PRN fashion during the day. He has found himself more short of breath whether resting or with activity over the last few days. He has no fever, increased cough, increased peripheral edema, or CP.   In the ED, CT ab/pelvis shows significant stool retention. CTA chest was negative for PE but did show a LLL opacity w/ cavitation that is concerning for malignancy. TRH was called for admission for lung mass and acute COPD exacerbation.   PCCM consulted.   Assessment & Plan:   Active Problems:   HTN (hypertension)   DM2 (diabetes mellitus, type 2) (HCC)   COPD with acute exacerbation (HCC)   Acute respiratory failure with hypoxemia (HCC)  Acute on chronic hypoxic respiratory failure secondary to acute COPD exacerbation (chronic 2L [place] use w/ CPAP at night): Still feels shortness of breath with minimal exertion even with going to the bathroom, with no improvement compared to yesterday.  On 2 L of oxygen which he usually uses at night only.  Faint end expiratory wheezes, much improved compared to yesterday.  Continue steroids, bronchodilators and antibiotics.  Constipation: this is a chronic condition that has recently been exacerbated.  Has had 1 bowel movement.  Continue current regimen.  LLL lung cavitary lesion: normal follow up with pulm [facility]  PCCM on board and he is tentatively scheduled for bronchoscopy with biopsy  tomorrow.  DM2: Recent hemoglobin A1c 7.5 last year.  Blood sugar controlled.  Continue SSI.  Anxiety/Depression: Controlled.     - continue home meds  Morbid obesity OSA     - CPAP at night     - follow up with PCP on diet, lifestyle changes  HTN: Controlled.  Continue current regimen which is diltiazem and lisinopril.  Headache: Resolved.  Chronic neck and low back pain: Takes pain medications and Flexeril at home.  All of them are resumed.  I did inform him that he needs to mobilize more and that will help his pain.  RN aware.  DVT prophylaxis: heparin injection 5,000 Units Start: 07/26/20 2200   Code Status: Full Code  Family Communication:  None present at bedside.  Plan of care discussed with patient in length and he verbalized understanding and agreed with it.  Status is: Inpatient  Remains inpatient appropriate because:Ongoing diagnostic testing needed not appropriate for outpatient work up   Dispo: The patient is from: Home              Anticipated d/c is to: Home              Patient currently is not medically stable to d/c.   Difficult to place patient No        Estimated body mass index is 44.01 kg/m as calculated from the following:   Height as of this encounter: 6\' 2"  (1.88 m).   Weight as of this encounter: 155.5  kg.      Nutritional status:               Consultants:   PCCM  Procedures:   None  Antimicrobials:  Anti-infectives (From admission, onward)   Start     Dose/Rate Route Frequency Ordered Stop   07/27/20 1000  azithromycin (ZITHROMAX) tablet 250 mg       "Followed by" Linked Group Details   250 mg Oral Daily 07/26/20 1123 07/31/20 0959   07/26/20 1130  azithromycin (ZITHROMAX) tablet 500 mg       "Followed by" Linked Group Details   500 mg Oral Daily 07/26/20 1123 07/26/20 1335         Subjective: Seen and examined.  Complains of neck pain and back pain which is chronic.  No headache.  Also complains of  shortness of breath with exertion with no improvement compared to yesterday.  Objective: Vitals:   07/27/20 1052 07/27/20 1154 07/27/20 2106 07/28/20 0912  BP: (!) 122/55 (!) 150/95 132/75   Pulse:  (!) 101 92   Resp:  20    Temp:  (!) 97.5 F (36.4 C) (!) 97.5 F (36.4 C)   TempSrc:  Oral Oral   SpO2:  95% 93% 93%  Weight:      Height:        Intake/Output Summary (Last 24 hours) at 07/28/2020 1005 Last data filed at 07/28/2020 0500 Gross per 24 hour  Intake 240 ml  Output 1375 ml  Net -1135 ml   Filed Weights   07/25/20 1944 07/26/20 1445  Weight: (!) 154.2 kg (!) 155.5 kg    Examination: General exam: Appears calm and comfortable, obese Respiratory system: Faint end expiratory wheezes bilaterally. Respiratory effort normal. Cardiovascular system: S1 & S2 heard, RRR. No JVD, murmurs, rubs, gallops or clicks. No pedal edema. Gastrointestinal system: Abdomen is nondistended, soft and nontender. No organomegaly or masses felt. Normal bowel sounds heard. Central nervous system: Alert and oriented. No focal neurological deficits. Extremities: Symmetric 5 x 5 power. Skin: No rashes, lesions or ulcers.  Psychiatry: Judgement and insight appear normal. Mood & affect appropriate.   Data Reviewed: I have personally reviewed following labs and imaging studies  CBC: Recent Labs  Lab 07/22/20 1550 07/25/20 2140 07/26/20 0448 07/26/20 1620 07/27/20 0505 07/28/20 0433  WBC 11.5* 12.0* 11.2* 8.6 7.5 11.5*  NEUTROABS 7.8* 6.9  --   --   --  6.4  HGB 15.0 15.1 14.2 14.3 14.5 15.0  HCT 44.8 45.0 43.1 42.9 43.9 46.1  MCV 97.4 98.7 98.2 98.2 98.7 98.9  PLT 270 276 269 258 263 235   Basic Metabolic Panel: Recent Labs  Lab 07/22/20 1550 07/25/20 2140 07/26/20 1620 07/27/20 0505 07/28/20 0433  NA 135 135  --  139 137  K 4.4 4.5  --  4.0 4.2  CL 98 101  --  105 104  CO2 27 23  --  26 24  GLUCOSE 108* 111*  --  125* 128*  BUN 15 15  --  15 22  CREATININE 1.06 1.05 1.28*  1.07 1.11  CALCIUM 9.1 9.3  --  8.9 9.0  MG  --   --   --   --  2.5*   GFR: Estimated Creatinine Clearance: 100.5 mL/min (by C-G formula based on SCr of 1.11 mg/dL). Liver Function Tests: Recent Labs  Lab 07/22/20 1550 07/25/20 2140 07/27/20 0505  AST 36 34 28  ALT 39 43 37  ALKPHOS 52 51 49  BILITOT 0.5 0.4 0.4  PROT 7.3 7.5 6.9  ALBUMIN 4.3 4.3 3.9   Recent Labs  Lab 07/25/20 2140  LIPASE 33   No results for input(s): AMMONIA in the last 168 hours. Coagulation Profile: No results for input(s): INR, PROTIME in the last 168 hours. Cardiac Enzymes: No results for input(s): CKTOTAL, CKMB, CKMBINDEX, TROPONINI in the last 168 hours. BNP (last 3 results) No results for input(s): PROBNP in the last 8760 hours. HbA1C: Recent Labs    07/26/20 0448  HGBA1C 6.9*   CBG: Recent Labs  Lab 07/27/20 0731 07/27/20 1151 07/27/20 1723 07/27/20 2109 07/28/20 0715  GLUCAP 134* 148* 124* 146* 121*   Lipid Profile: No results for input(s): CHOL, HDL, LDLCALC, TRIG, CHOLHDL, LDLDIRECT in the last 72 hours. Thyroid Function Tests: No results for input(s): TSH, T4TOTAL, FREET4, T3FREE, THYROIDAB in the last 72 hours. Anemia Panel: No results for input(s): VITAMINB12, FOLATE, FERRITIN, TIBC, IRON, RETICCTPCT in the last 72 hours. Sepsis Labs: No results for input(s): PROCALCITON, LATICACIDVEN in the last 168 hours.  Recent Results (from the past 240 hour(s))  Resp Panel by RT-PCR (Flu A&B, Covid) Nasopharyngeal Swab     Status: None   Collection Time: 07/26/20  2:33 AM   Specimen: Nasopharyngeal Swab; Nasopharyngeal(NP) swabs in vial transport medium  Result Value Ref Range Status   SARS Coronavirus 2 by RT PCR NEGATIVE NEGATIVE Final    Comment: (NOTE) SARS-CoV-2 target nucleic acids are NOT DETECTED.  The SARS-CoV-2 RNA is generally detectable in upper respiratory specimens during the acute phase of infection. The lowest concentration of SARS-CoV-2 viral copies this assay  can detect is 138 copies/mL. A negative result does not preclude SARS-Cov-2 infection and should not be used as the sole basis for treatment or other patient management decisions. A negative result may occur with  improper specimen collection/handling, submission of specimen other than nasopharyngeal swab, presence of viral mutation(s) within the areas targeted by this assay, and inadequate number of viral copies(<138 copies/mL). A negative result must be combined with clinical observations, patient history, and epidemiological information. The expected result is Negative.  Fact Sheet for Patients:  EntrepreneurPulse.com.au  Fact Sheet for Healthcare Providers:  IncredibleEmployment.be  This test is no t yet approved or cleared by the [place] FDA and  has been authorized for detection and/or diagnosis of SARS-CoV-2 by FDA under an Emergency Use Authorization (EUA). This EUA will remain  in effect (meaning this test can be used) for the duration of the COVID-19 declaration under Section 564(b)(1) of the Act, 21 U.S.C.section 360bbb-3(b)(1), unless the authorization is terminated  or revoked sooner.       Influenza A by PCR NEGATIVE NEGATIVE Final   Influenza B by PCR NEGATIVE NEGATIVE Final    Comment: (NOTE) The Xpert Xpress SARS-CoV-2/FLU/RSV plus assay is intended as an aid in the diagnosis of influenza from Nasopharyngeal swab specimens and should not be used as a sole basis for treatment. Nasal washings and aspirates are unacceptable for Xpert Xpress SARS-CoV-2/FLU/RSV testing.  Fact Sheet for Patients: EntrepreneurPulse.com.au  Fact Sheet for Healthcare Providers: IncredibleEmployment.be  This test is not yet approved or cleared by the [place] FDA and has been authorized for detection and/or diagnosis of SARS-CoV-2 by FDA under an Emergency Use Authorization (EUA). This EUA will  remain in effect (meaning this test can be used) for the duration of the COVID-19 declaration under Section 564(b)(1) of the Act, 21 U.S.C. section 360bbb-3(b)(1), unless the authorization is terminated or revoked.  Performed at [facility], [place] [address] [place], [place] [address]       Radiology Studies: No results found.  Scheduled Meds: . arformoterol  15 mcg Nebulization BID  . vitamin C  500 mg Oral BID  . aspirin EC  81 mg Oral Daily  . azithromycin  250 mg Oral Daily  . B-complex with vitamin C  1 tablet Oral Daily  . buPROPion  300 mg Oral Daily  . busPIRone  15 mg Oral BID  . calcium-vitamin D  1 tablet Oral BID WC  . cholecalciferol  1,000 Units Oral Daily  . cyclobenzaprine  20 mg Oral QHS  . diltiazem  180 mg Oral Daily  . docusate sodium  100 mg Oral BID  . DULoxetine  60 mg Oral BID  . folic acid  1 mg Oral Daily  . gabapentin  900 mg Oral QHS  . heparin  5,000 Units Subcutaneous Q8H  . insulin aspart  0-15 Units Subcutaneous TID WC  . insulin aspart  0-5 Units Subcutaneous QHS  . lactulose  10 g Oral BID  . lisinopril  20 mg Oral Daily  . lurasidone  40 mg Oral QPC supper  . melatonin  6 mg Oral QHS  . multivitamin with minerals  1 tablet Oral Daily  . mycophenolate  1,500 mg Oral BID  . oxcarbazepine  600 mg Oral TID  . polyethylene glycol  17 g Oral Daily  . predniSONE  40 mg Oral Q breakfast  . revefenacin  175 mcg Nebulization Daily  . senna-docusate  1 tablet Oral BID  . tamsulosin  0.4 mg Oral Daily  . traMADol  100 mg Oral QHS  . traZODone  75 mg Oral QHS   Continuous Infusions:   LOS: 2 days   Time spent: 30 minutes   Darliss Cheney, MD [facility]  07/28/2020, 10:05 AM   How to contact the [facility] Attending or Consulting provider [place] or covering provider during after hours [place], for this patient?  1. Check the care team in [facility] and look for a) attending/consulting [facility] provider listed and b) the [facility] team  listed. Page or secure chat [phone]. 2. Log into [email or web address] and use [place]'s universal password to access. If you do not have the password, please contact the hospital operator. 3. Locate the [facility] provider you are looking for under [facility] and page to a number that you can be directly reached. 4. If you still have difficulty reaching the provider, please page the [facility] (Director on Call) for the Hospitalists listed on amion for assistance.

## 2020-07-28 NOTE — Progress Notes (Signed)
Patient c/o of muscle spasms and requesting flexeril. Notified Dr. Sidney Ace. New order for 10mg  PO flexeril now and TID PRN for muscle spasms. The flexeril 20mg  PO HS order was discontinued. Will continue to monitor.

## 2020-07-29 ENCOUNTER — Encounter (HOSPITAL_COMMUNITY): Payer: Self-pay | Admitting: Internal Medicine

## 2020-07-29 LAB — GLUCOSE, CAPILLARY
Glucose-Capillary: 118 mg/dL — ABNORMAL HIGH (ref 70–99)
Glucose-Capillary: 171 mg/dL — ABNORMAL HIGH (ref 70–99)
Glucose-Capillary: 198 mg/dL — ABNORMAL HIGH (ref 70–99)
Glucose-Capillary: 204 mg/dL — ABNORMAL HIGH (ref 70–99)

## 2020-07-29 LAB — CBC WITH DIFFERENTIAL/PLATELET
Abs Immature Granulocytes: 0.05 10*3/uL (ref 0.00–0.07)
Basophils Absolute: 0.1 10*3/uL (ref 0.0–0.1)
Basophils Relative: 1 %
Eosinophils Absolute: 0.1 10*3/uL (ref 0.0–0.5)
Eosinophils Relative: 1 %
HCT: 41.2 % (ref 39.0–52.0)
Hemoglobin: 13.6 g/dL (ref 13.0–17.0)
Immature Granulocytes: 1 %
Lymphocytes Relative: 34 %
Lymphs Abs: 3.7 10*3/uL (ref 0.7–4.0)
MCH: 32.5 pg (ref 26.0–34.0)
MCHC: 33 g/dL (ref 30.0–36.0)
MCV: 98.6 fL (ref 80.0–100.0)
Monocytes Absolute: 1.2 10*3/uL — ABNORMAL HIGH (ref 0.1–1.0)
Monocytes Relative: 10 %
Neutro Abs: 5.9 10*3/uL (ref 1.7–7.7)
Neutrophils Relative %: 53 %
Platelets: 288 10*3/uL (ref 150–400)
RBC: 4.18 MIL/uL — ABNORMAL LOW (ref 4.22–5.81)
RDW: 13.2 % (ref 11.5–15.5)
WBC: 11.1 10*3/uL — ABNORMAL HIGH (ref 4.0–10.5)
nRBC: 0 % (ref 0.0–0.2)

## 2020-07-29 MED ORDER — LACTULOSE 10 GM/15ML PO SOLN
20.0000 g | Freq: Two times a day (BID) | ORAL | Status: DC
  Administered 2020-07-29 – 2020-08-01 (×6): 20 g via ORAL
  Filled 2020-07-29 (×7): qty 30

## 2020-07-29 NOTE — Anesthesia Preprocedure Evaluation (Addendum)
Anesthesia Evaluation  Patient identified by MRN, date of birth, ID band Patient awake    Reviewed: Allergy & Precautions, NPO status , Patient's Chart, lab work & pertinent test results  History of Anesthesia Complications Negative for: history of anesthetic complications  Airway Mallampati: I  TM Distance: >3 FB Neck ROM: Full    Dental  (+) Poor Dentition, Missing, Dental Advisory Given   Pulmonary sleep apnea and Continuous Positive Airway Pressure Ventilation , COPD (2L home O2),  oxygen dependent, former smoker,  Lung mass   Pulmonary exam normal        Cardiovascular hypertension, Normal cardiovascular exam     Neuro/Psych Anxiety Depression negative neurological ROS     GI/Hepatic negative GI ROS, Neg liver ROS,   Endo/Other  diabetes, Type 2Morbid obesity  Renal/GU Renal InsufficiencyRenal disease (CKD; Cr 1.11)  negative genitourinary   Musculoskeletal negative musculoskeletal ROS (+)   Abdominal   Peds  Hematology negative hematology ROS (+)   Anesthesia Other Findings  Admitted 07/25/20 for COPD exacerbation, found to have lung mass on workup; back on home O2 requirement at this time  Echo 07/03/19: EF 50-55% (low normal), mild LVH, normal RV function, valves unremarkable  Reproductive/Obstetrics                            Anesthesia Physical Anesthesia Plan  ASA: IV  Anesthesia Plan: General   Post-op Pain Management:    Induction: Intravenous  PONV Risk Score and Plan: 2 and Ondansetron, Dexamethasone, Treatment may vary due to age or medical condition and Midazolam  Airway Management Planned: Oral ETT  Additional Equipment: None  Intra-op Plan:   Post-operative Plan: Extubation in OR  Informed Consent: I have reviewed the patients History and Physical, chart, labs and discussed the procedure including the risks, benefits and alternatives for the proposed  anesthesia with the patient or authorized representative who has indicated his/her understanding and acceptance.     Dental advisory given  Plan Discussed with:   Anesthesia Plan Comments:        Anesthesia Quick Evaluation

## 2020-07-29 NOTE — Progress Notes (Signed)
Pt declined assistance with nocturnal cpap.  Pt stated he will call if he wants to wear it later tonight.  Machine remains in room on standby.

## 2020-07-29 NOTE — Progress Notes (Signed)
SATURATION QUALIFICATIONS: (This note is used to comply with regulatory documentation for home oxygen)  Patient Saturations on Room Air at Rest = 86%  Patient Saturations on Room Air while Ambulating = 82%  Patient Saturations on 4 Liters of oxygen while Ambulating = 93%  Please briefly explain why patient needs home oxygen:Pt requires up to 4L of O2 while walking to maintain satutations above 92%

## 2020-07-29 NOTE — Progress Notes (Signed)
[facility] Progress Note  Patient: Mario [name]    TIW:[id number]  DOA: 07/25/2020     Date of Service: the patient was seen and examined on 07/29/2020  Brief hospital course: Past medical history of COPD, HTN, anxiety, DM.  Presents with complaints of constipation and shortness of breath.  Found to have COPD exacerbation.  Chronically using 2 LPM at home.  Currently back on 2 LPM. CTA chest shows evidence of left lower lobe opacity with cavitation.  Currently scheduled for bronchoscopy on 6/2. Currently plan is monitor postop recovery.  Assessment and Plan: 1.  Acute on chronic hypoxic respiratory failure Acute COPD exacerbation secondary to bronchitis OSA Chronically on 2 LPM use as well as CPAP Currently breathing is improved significantly. Still exertional dyspnea and dizziness reported by the patient. Although maintains good saturation on 2 LPM continue steroids, inhalers as well as antibiotics.  2.  Left lower lobe cavitary lesion Follows up with pulmonary at [facility]. PCCM was consulted. Bronchoscopy and biopsy on 6/2. Can be discharged home once procedure is completed.  3.  Constipation Appears to be a chronic problem. Currently improving.  4.  Type 2 diabetes mellitus, uncontrolled with hyperglycemia.  Without long-term insulin use without any complication. Hemoglobin A1c 7.5. Blood sugars mildly elevated secondary to steroids. Continue current regimen.  5.  Anxiety and depression. No current complaints. Stable.  Continue current regimen.  6.  Essential hypertension On Cardizem and lisinopril.  Continue both.  7.  Morbid obesity, OSA Placing the patient at high risk of poor outcome. Follow-up with PCP for diet and lifestyle changes. Body mass index is 44.01 kg/m.   Diet: Carb modified diet DVT Prophylaxis:   heparin injection 5,000 Units Start: 07/26/20 2200    Advance goals of care discussion: Full code  Family Communication: no family was present at  bedside, at the time of interview.   Disposition:  Status is: Inpatient  Remains inpatient appropriate because:Ongoing diagnostic testing needed not appropriate for outpatient work up   Dispo: The patient is from: Home              Anticipated d/c is to: Home              Patient currently is not medically stable to d/c.   Difficult to place patient No   Subjective: No nausea no vomiting.  No shortness of breath at rest but  going to the bathroom has some shortness of breath as well as occasional dizziness.  Sleeping okay.  Physical Exam:  General: Appear in mild distress, no Rash; Oral Mucosa Clear, moist. no Abnormal Neck Mass Or lumps, Conjunctiva normal  Cardiovascular: S1 and S2 Present, no Murmur, Respiratory: increased respiratory effort, Bilateral Air entry present and faint crackles, no wheezes Abdomen: Bowel Sound present, Soft and no tenderness Extremities: trace Pedal edema Neurology: alert and oriented to time, place, and person affect appropriate. no new focal deficit Gait not checked due to patient safety concerns    Vitals:   07/29/20 0420 07/29/20 0902 07/29/20 0902 07/29/20 0941  BP: (!) 152/101 133/75 133/75   Pulse: 98  86   Resp:      Temp: 97.6 F (36.4 C)     TempSrc:      SpO2: 94%  96% 97%  Weight:      Height:        Intake/Output Summary (Last 24 hours) at 07/29/2020 1132 Last data filed at 07/29/2020 0934 Gross per 24 hour  Intake 840 ml  Output --  Net 840 ml   Filed Weights   07/25/20 1944 07/26/20 1445  Weight: (!) 154.2 kg (!) 155.5 kg    Data Reviewed: I have personally reviewed and interpreted daily labs, tele strips, imaging. I reviewed all nursing notes, pharmacy notes, vitals, pertinent old records I have discussed plan of care as described above with RN and patient/family.  CBC: Recent Labs  Lab 07/22/20 1550 07/25/20 2140 07/26/20 0448 07/26/20 1620 07/27/20 0505 07/28/20 0433 07/29/20 0438  WBC 11.5* 12.0* 11.2*  8.6 7.5 11.5* 11.1*  NEUTROABS 7.8* 6.9  --   --   --  6.4 5.9  HGB 15.0 15.1 14.2 14.3 14.5 15.0 13.6  HCT 44.8 45.0 43.1 42.9 43.9 46.1 41.2  MCV 97.4 98.7 98.2 98.2 98.7 98.9 98.6  PLT 270 276 269 258 263 281 161   Basic Metabolic Panel: Recent Labs  Lab 07/22/20 1550 07/25/20 2140 07/26/20 1620 07/27/20 0505 07/28/20 0433  NA 135 135  --  139 137  K 4.4 4.5  --  4.0 4.2  CL 98 101  --  105 104  CO2 27 23  --  26 24  GLUCOSE 108* 111*  --  125* 128*  BUN 15 15  --  15 22  CREATININE 1.06 1.05 1.28* 1.07 1.11  CALCIUM 9.1 9.3  --  8.9 9.0  MG  --   --   --   --  2.5*    Studies: No results found.  Scheduled Meds: . arformoterol  15 mcg Nebulization BID  . vitamin C  500 mg Oral BID  . aspirin EC  81 mg Oral Daily  . azithromycin  250 mg Oral Daily  . B-complex with vitamin C  1 tablet Oral Daily  . buPROPion  300 mg Oral Daily  . busPIRone  15 mg Oral BID  . calcium-vitamin D  1 tablet Oral BID WC  . cholecalciferol  1,000 Units Oral Daily  . cyclobenzaprine  20 mg Oral QHS  . diltiazem  180 mg Oral Daily  . docusate sodium  100 mg Oral BID  . DULoxetine  60 mg Oral BID  . folic acid  1 mg Oral Daily  . gabapentin  900 mg Oral QHS  . heparin  5,000 Units Subcutaneous Q8H  . insulin aspart  0-15 Units Subcutaneous TID WC  . insulin aspart  0-5 Units Subcutaneous QHS  . lactulose  10 g Oral BID  . lisinopril  20 mg Oral Daily  . lurasidone  40 mg Oral QPC supper  . melatonin  6 mg Oral QHS  . multivitamin with minerals  1 tablet Oral Daily  . mycophenolate  1,500 mg Oral BID  . oxcarbazepine  600 mg Oral TID  . polyethylene glycol  17 g Oral Daily  . predniSONE  40 mg Oral Q breakfast  . revefenacin  175 mcg Nebulization Daily  . senna-docusate  1 tablet Oral BID  . tamsulosin  0.4 mg Oral Daily  . traMADol  100 mg Oral QHS  . traZODone  75 mg Oral QHS   Continuous Infusions: PRN Meds: acetaminophen **OR** acetaminophen, cyclobenzaprine, docusate sodium,  ipratropium-albuterol, nitroGLYCERIN, ondansetron **OR** ondansetron (ZOFRAN) IV, phenol, polyvinyl alcohol  Time spent: 35 minutes  Author: [name], MD [facility] 07/29/2020 11:32 AM  To reach On-call, see care teams to locate the attending and reach out via [email or web address]. Between 7PM-7AM, please contact night-coverage If you still have difficulty reaching the attending provider, please  page the [facility] (Director on Call) for [facility] on amion for assistance.

## 2020-07-30 ENCOUNTER — Inpatient Hospital Stay (HOSPITAL_COMMUNITY): Payer: Medicare Other | Admitting: Anesthesiology

## 2020-07-30 ENCOUNTER — Encounter (HOSPITAL_COMMUNITY)
Admission: EM | Disposition: A | Discharge status: Home / Self Care | Payer: Self-pay | Source: Home / Self Care | Attending: Internal Medicine

## 2020-07-30 ENCOUNTER — Inpatient Hospital Stay (HOSPITAL_COMMUNITY): Payer: Medicare Other

## 2020-07-30 ENCOUNTER — Telehealth: Payer: Self-pay | Admitting: Pulmonary Disease

## 2020-07-30 ENCOUNTER — Encounter: Payer: Self-pay | Admitting: Internal Medicine

## 2020-07-30 DIAGNOSIS — R918 Other nonspecific abnormal finding of lung field: Secondary | ICD-10-CM

## 2020-07-30 DIAGNOSIS — J441 Chronic obstructive pulmonary disease with (acute) exacerbation: Secondary | ICD-10-CM | POA: Diagnosis not present

## 2020-07-30 DIAGNOSIS — R911 Solitary pulmonary nodule: Secondary | ICD-10-CM | POA: Diagnosis present

## 2020-07-30 HISTORY — PX: BRONCHIAL NEEDLE ASPIRATION BIOPSY: SHX5106

## 2020-07-30 HISTORY — PX: VIDEO BRONCHOSCOPY WITH ENDOBRONCHIAL NAVIGATION: SHX6175

## 2020-07-30 HISTORY — PX: BRONCHIAL BRUSHINGS: SHX5108

## 2020-07-30 HISTORY — PX: BRONCHIAL WASHINGS: SHX5105

## 2020-07-30 HISTORY — PX: BRONCHIAL BIOPSY: SHX5109

## 2020-07-30 LAB — BASIC METABOLIC PANEL
Anion gap: 8 (ref 5–15)
BUN: 20 mg/dL (ref 8–23)
CO2: 27 mmol/L (ref 22–32)
Calcium: 8.8 mg/dL — ABNORMAL LOW (ref 8.9–10.3)
Chloride: 99 mmol/L (ref 98–111)
Creatinine, Ser: 0.89 mg/dL (ref 0.61–1.24)
GFR, Estimated: >60 mL/min (ref >60)
Glucose, Bld: 119 mg/dL — ABNORMAL HIGH (ref 70–99)
Potassium: 3.9 mmol/L (ref 3.5–5.1)
Sodium: 134 mmol/L — ABNORMAL LOW (ref 135–145)

## 2020-07-30 LAB — GLUCOSE, CAPILLARY
Glucose-Capillary: 76 mg/dL (ref 70–99)
Glucose-Capillary: 186 mg/dL — ABNORMAL HIGH (ref 70–99)
Glucose-Capillary: 196 mg/dL — ABNORMAL HIGH (ref 70–99)
Glucose-Capillary: 214 mg/dL — ABNORMAL HIGH (ref 70–99)

## 2020-07-30 LAB — CBC
HCT: 42.6 % (ref 39.0–52.0)
Hemoglobin: 14.2 g/dL (ref 13.0–17.0)
MCH: 32.6 pg (ref 26.0–34.0)
MCHC: 33.3 g/dL (ref 30.0–36.0)
MCV: 97.9 fL (ref 80.0–100.0)
Platelets: 269 10*3/uL (ref 150–400)
RBC: 4.35 MIL/uL (ref 4.22–5.81)
RDW: 13.1 % (ref 11.5–15.5)
WBC: 10.7 10*3/uL — ABNORMAL HIGH (ref 4.0–10.5)
nRBC: 0 % (ref 0.0–0.2)

## 2020-07-30 SURGERY — VIDEO BRONCHOSCOPY WITH ENDOBRONCHIAL NAVIGATION
Anesthesia: General | Laterality: Left

## 2020-07-30 MED ORDER — OXYCODONE HCL 5 MG/5ML PO SOLN: 5.0000 mg | Freq: Once | ORAL | Status: DC | PRN

## 2020-07-30 MED ORDER — LIDOCAINE 2% (20 MG/ML) 5 ML SYRINGE
INTRAMUSCULAR | Status: DC | PRN
  Administered 2020-07-30: 50 mg via INTRAVENOUS

## 2020-07-30 MED ORDER — FENTANYL CITRATE (PF) 100 MCG/2ML IJ SOLN: 25.0000 ug | INTRAMUSCULAR | Status: DC | PRN

## 2020-07-30 MED ORDER — IPRATROPIUM-ALBUTEROL 0.5-2.5 (3) MG/3ML IN SOLN
3.0000 mL | Freq: Four times a day (QID) | RESPIRATORY_TRACT | Status: DC
  Administered 2020-07-30: 3 mL via RESPIRATORY_TRACT
  Filled 2020-07-30: qty 3

## 2020-07-30 MED ORDER — ALBUTEROL SULFATE (2.5 MG/3ML) 0.083% IN NEBU
2.5000 mg | INHALATION_SOLUTION | Freq: Three times a day (TID) | RESPIRATORY_TRACT | Status: DC
  Administered 2020-07-31 – 2020-08-05 (×15): 2.5 mg via RESPIRATORY_TRACT
  Filled 2020-07-30 (×17): qty 3

## 2020-07-30 MED ORDER — DEXAMETHASONE SODIUM PHOSPHATE 10 MG/ML IJ SOLN
INTRAMUSCULAR | Status: DC | PRN
  Administered 2020-07-30: 5 mg via INTRAVENOUS

## 2020-07-30 MED ORDER — AMISULPRIDE (ANTIEMETIC) 5 MG/2ML IV SOLN: 10.0000 mg | Freq: Once | INTRAVENOUS | Status: DC | PRN

## 2020-07-30 MED ORDER — FUROSEMIDE 10 MG/ML IJ SOLN
20.0000 mg | Freq: Once | INTRAMUSCULAR | Status: AC
  Administered 2020-07-30: 20 mg via INTRAVENOUS
  Filled 2020-07-30: qty 2

## 2020-07-30 MED ORDER — SUCCINYLCHOLINE CHLORIDE 20 MG/ML IJ SOLN
INTRAMUSCULAR | Status: DC | PRN
  Administered 2020-07-30: 160 mg via INTRAVENOUS

## 2020-07-30 MED ORDER — PHENYLEPHRINE HCL-NACL 10-0.9 MG/250ML-% IV SOLN
INTRAVENOUS | Status: DC | PRN
  Administered 2020-07-30: 40 ug/min via INTRAVENOUS

## 2020-07-30 MED ORDER — ONDANSETRON HCL 4 MG/2ML IJ SOLN: 4.0000 mg | Freq: Once | INTRAMUSCULAR | Status: DC | PRN

## 2020-07-30 MED ORDER — SUGAMMADEX SODIUM 200 MG/2ML IV SOLN
INTRAVENOUS | Status: DC | PRN
  Administered 2020-07-30: 400 mg via INTRAVENOUS

## 2020-07-30 MED ORDER — FENTANYL CITRATE (PF) 250 MCG/5ML IJ SOLN
INTRAMUSCULAR | Status: DC | PRN
  Administered 2020-07-30: 50 ug via INTRAVENOUS

## 2020-07-30 MED ORDER — PROPOFOL 10 MG/ML IV BOLUS
INTRAVENOUS | Status: DC | PRN
  Administered 2020-07-30: 180 mg via INTRAVENOUS

## 2020-07-30 MED ORDER — ROCURONIUM BROMIDE 10 MG/ML (PF) SYRINGE
PREFILLED_SYRINGE | INTRAVENOUS | Status: DC | PRN
  Administered 2020-07-30: 40 mg via INTRAVENOUS
  Administered 2020-07-30: 10 mg via INTRAVENOUS

## 2020-07-30 MED ORDER — ALBUTEROL SULFATE HFA 108 (90 BASE) MCG/ACT IN AERS
INHALATION_SPRAY | RESPIRATORY_TRACT | Status: DC | PRN
  Administered 2020-07-30 (×2): 3 via RESPIRATORY_TRACT

## 2020-07-30 MED ORDER — LACTATED RINGERS IV SOLN: INTRAVENOUS | Status: DC | PRN

## 2020-07-30 MED ORDER — OXYCODONE HCL 5 MG PO TABS: 5.0000 mg | ORAL_TABLET | Freq: Once | ORAL | Status: DC | PRN

## 2020-07-30 MED ORDER — ALBUTEROL SULFATE (2.5 MG/3ML) 0.083% IN NEBU: 2.5000 mg | INHALATION_SOLUTION | Freq: Four times a day (QID) | RESPIRATORY_TRACT | Status: DC | PRN

## 2020-07-30 SURGICAL SUPPLY — 46 items
ADAPTER BRONCH F/PENTAX (ADAPTER) ×4 IMPLANT
ADAPTER VALVE BIOPSY EBUS (MISCELLANEOUS) IMPLANT
ADPTR VALVE BIOPSY EBUS (MISCELLANEOUS)
BRUSH CYTOL CELLEBRITY 1.5X140 (MISCELLANEOUS) ×4 IMPLANT
BRUSH SUPERTRAX BIOPSY (INSTRUMENTS) IMPLANT
BRUSH SUPERTRAX NDL-TIP CYTO (INSTRUMENTS) ×4 IMPLANT
CANISTER SUCT 3000ML PPV (MISCELLANEOUS) ×4 IMPLANT
CHANNEL WORK EXTEND EDGE 180 (KITS) IMPLANT
CHANNEL WORK EXTEND EDGE 45 (KITS) IMPLANT
CHANNEL WORK EXTEND EDGE 90 (KITS) IMPLANT
CONT SPEC 4OZ CLIKSEAL STRL BL (MISCELLANEOUS) ×4 IMPLANT
COVER BACK TABLE 60X90IN (DRAPES) ×4 IMPLANT
FILTER STRAW FLUID ASPIR (MISCELLANEOUS) IMPLANT
FORCEPS BIOP SUPERTRX PREMAR (INSTRUMENTS) ×4 IMPLANT
GAUZE SPONGE 4X4 12PLY STRL (GAUZE/BANDAGES/DRESSINGS) ×4 IMPLANT
GLOVE SURG SS PI 7.5 STRL IVOR (GLOVE) ×8 IMPLANT
GOWN STRL REUS W/ TWL LRG LVL3 (GOWN DISPOSABLE) ×4 IMPLANT
GOWN STRL REUS W/TWL LRG LVL3 (GOWN DISPOSABLE) ×4
KIT CLEAN ENDO COMPLIANCE (KITS) ×4 IMPLANT
KIT LOCATABLE GUIDE (CANNULA) IMPLANT
KIT MARKER FIDUCIAL DELIVERY (KITS) IMPLANT
KIT PROCEDURE EDGE 180 (KITS) IMPLANT
KIT PROCEDURE EDGE 45 (KITS) IMPLANT
KIT PROCEDURE EDGE 90 (KITS) IMPLANT
KIT TURNOVER KIT B (KITS) ×4 IMPLANT
MARKER SKIN DUAL TIP RULER LAB (MISCELLANEOUS) ×4 IMPLANT
NDL SUPERTRX PREMARK BIOPSY (NEEDLE) ×2 IMPLANT
NEEDLE SUPERTRX PREMARK BIOPSY (NEEDLE) ×4 IMPLANT
NS IRRIG 1000ML POUR BTL (IV SOLUTION) ×4 IMPLANT
OIL SILICONE PENTAX (PARTS (SERVICE/REPAIRS)) ×4 IMPLANT
PAD ARMBOARD 7.5X6 YLW CONV (MISCELLANEOUS) ×8 IMPLANT
PATCHES PATIENT (LABEL) ×12 IMPLANT
SOL ANTI FOG 6CC (MISCELLANEOUS) ×2 IMPLANT
SOLUTION ANTI FOG 6CC (MISCELLANEOUS) ×2
SYR 20CC LL (SYRINGE) ×4 IMPLANT
SYR 20ML ECCENTRIC (SYRINGE) ×4 IMPLANT
SYR 50ML SLIP (SYRINGE) ×4 IMPLANT
TOWEL OR 17X24 6PK STRL BLUE (TOWEL DISPOSABLE) ×4 IMPLANT
TRAP SPECIMEN MUCOUS 40CC (MISCELLANEOUS) IMPLANT
TUBE CONNECTING 20'X1/4 (TUBING) ×1
TUBE CONNECTING 20X1/4 (TUBING) ×3 IMPLANT
UNDERPAD 30X30 (UNDERPADS AND DIAPERS) ×4 IMPLANT
VALVE BIOPSY  SINGLE USE (MISCELLANEOUS) ×2
VALVE BIOPSY SINGLE USE (MISCELLANEOUS) ×2 IMPLANT
VALVE SUCTION BRONCHIO DISP (MISCELLANEOUS) ×4 IMPLANT
WATER STERILE IRR 1000ML POUR (IV SOLUTION) ×4 IMPLANT

## 2020-07-30 NOTE — Plan of Care (Signed)
  Problem: Education: Goal: Knowledge of General Education information will improve Description: Including pain rating scale, medication(s)/side effects and non-pharmacologic comfort measures Outcome: Progressing   Problem: Health Behavior/Discharge Planning: Goal: Ability to manage health-related needs will improve Outcome: Progressing   Problem: Clinical Measurements: Goal: Ability to maintain clinical measurements within normal limits will improve Outcome: Progressing Goal: Will remain free from infection Outcome: Progressing Goal: Diagnostic test results will improve Outcome: Progressing Goal: Respiratory complications will improve Outcome: Progressing Goal: Cardiovascular complication will be avoided Outcome: Progressing   Problem: Activity: Goal: Risk for activity intolerance will decrease Outcome: Progressing   Problem: Coping: Goal: Level of anxiety will decrease Outcome: Progressing   Problem: Elimination: Goal: Will not experience complications related to bowel motility Outcome: Progressing   Problem: Pain Managment: Goal: General experience of comfort will improve Outcome: Progressing   Problem: Safety: Goal: Ability to remain free from injury will improve Outcome: Progressing

## 2020-07-30 NOTE — Transfer of Care (Signed)
Immediate Anesthesia Transfer of Care Note  Patient: AKIF WELDY  Procedure(s) Performed: VIDEO BRONCHOSCOPY WITH ENDOBRONCHIAL NAVIGATION (Left ) BRONCHIAL BRUSHINGS BRONCHIAL NEEDLE ASPIRATION BIOPSIES BRONCHIAL BIOPSIES BRONCHIAL WASHINGS  Patient Location: PACU  Anesthesia Type:General  Level of Consciousness: awake, alert  and oriented  Airway & Oxygen Therapy: Patient Spontanous Breathing and Patient connected to face mask oxygen  Post-op Assessment: Report given to RN, Post -op Vital signs reviewed and stable and Patient moving all extremities X 4  Post vital signs: Reviewed and stable  Last Vitals:  Vitals Value Taken Time  BP 133/86 07/30/20 0840  Temp    Pulse 91 07/30/20 0840  Resp 20 07/30/20 0840  SpO2 96 % 07/30/20 0840  Vitals shown include unvalidated device data.  Last Pain:  Vitals:   07/30/20 0513  TempSrc: Oral  PainSc:       Patients Stated Pain Goal: 0 (97/41/63 8453)  Complications: No complications documented.

## 2020-07-30 NOTE — Progress Notes (Signed)
[facility] Progress Note  Patient: Mario [name]    BOF:[id number]  DOA: 07/25/2020     Date of Service: the patient was seen and examined on 07/30/2020  Brief hospital course: Past medical history of COPD, HTN, anxiety, DM.  Presents with complaints of constipation and shortness of breath.  Found to have COPD exacerbation.  Chronically using 2 LPM at home.  Currently back on 2 LPM. CTA chest shows evidence of left lower lobe opacity with cavitation.  Currently scheduled for bronchoscopy on 6/2. Currently plan is monitor improvement in respiratory distress  Assessment and Plan: 1.  Acute on chronic hypoxic respiratory failure Acute COPD exacerbation secondary to bronchitis OSA Chronically on 2 LPM use as well as CPAP Initially breathing is improved but now reports worsening shortness of breath again and on 5 LPM 94%. Still exertional dyspnea and dizziness reported by the patient. continue steroids, inhalers as well as antibiotics. 1 dose of IV Lasix and monitor response. will require DME nebulizer at home. Continue current dose of prednisone for now.  2.  Left lower lobe cavitary lesion Follows up with pulmonary at [facility]. PCCM was consulted. Bronchoscopy and biopsy on 6/2. Tolerated procedure very well other than some sore throat. Postop chest x-ray shows no evidence of acute abnormality or pneumothorax.  3.  Constipation Appears to be a chronic problem. Currently improving.  4.  Type 2 diabetes mellitus, uncontrolled with hyperglycemia.  Without long-term insulin use without any complication. Hemoglobin A1c 7.5. Blood sugars mildly elevated secondary to steroids. Continue current regimen.  5.  Anxiety and depression. No current complaints. Stable.  Continue current regimen.  6.  Essential hypertension On Cardizem and lisinopril.  Continue both.  7.  Morbid obesity, OSA Placing the patient at high risk of poor outcome. Follow-up with PCP for diet and lifestyle  changes. Body mass index is 44.01 kg/m.   8.  Neck pain. Reportedly chronic in nature. Currently no pain. No radicular symptoms as well.  No tingling or numbness on exam. Sees neurology patient is on Flexeril for the same. Outpatient follow-up with PCP and neurology.  Diet: Carb modified diet DVT Prophylaxis:   heparin injection 5,000 Units Start: 07/26/20 2200    Advance goals of care discussion: Full code  Family Communication: no family was present at bedside, at the time of interview.   Disposition:  Status is: Inpatient  Remains inpatient appropriate because:Ongoing diagnostic testing needed not appropriate for outpatient work up Now on 5 LPM oxygen post bronchoscopy.  Dispo: The patient is from: Home              Anticipated d/c is to: Home              Patient currently is not medically stable to d/c.   Difficult to place patient No   Subjective: Reports no shortness of breath.  Continues to have some cough.  Continues to have fatigue and tiredness.  Also reports dizziness on exertion.  Tells me that he dropped down to 82% on ambulation on 5 L.  Physical Exam:  General: Appear in mild distress, no Rash; Oral Mucosa Clear, moist. no Abnormal Neck Mass Or lumps, Conjunctiva normal  Cardiovascular: S1 and S2 Present, no Murmur, Respiratory: increased respiratory effort, Bilateral Air entry present and no Crackles, bilateral wheezes Abdomen: Bowel Sound present, Soft and no tenderness Extremities: bilateral Pedal edema Neurology: alert and oriented to time, place, and person affect appropriate. no new focal deficit Gait not checked due to patient  safety concerns  Vitals:   07/30/20 0910 07/30/20 0925 07/30/20 1009 07/30/20 1223  BP: 139/87 132/78 125/77 (!) 142/74  Pulse: 88 86 90 97  Resp: 19 17 (!) 21 19  Temp:  97.7 F (36.5 C)  98 F (36.7 C)  TempSrc:      SpO2: 93% 94% 94% 93%  Weight:      Height:        Intake/Output Summary (Last 24 hours) at  07/30/2020 1520 Last data filed at 07/30/2020 1257 Gross per 24 hour  Intake 1360 ml  Output 405 ml  Net 955 ml   Filed Weights   07/25/20 1944 07/26/20 1445  Weight: (!) 154.2 kg (!) 155.5 kg    Data Reviewed: I have personally reviewed and interpreted daily labs, tele strips, imaging. I reviewed all nursing notes, pharmacy notes, vitals, pertinent old records I have discussed plan of care as described above with RN and patient/family.  CBC: Recent Labs  Lab 07/25/20 2140 07/26/20 0448 07/26/20 1620 07/27/20 0505 07/28/20 0433 07/29/20 0438 07/30/20 0453  WBC 12.0*   < > 8.6 7.5 11.5* 11.1* 10.7*  NEUTROABS 6.9  --   --   --  6.4 5.9  --   HGB 15.1   < > 14.3 14.5 15.0 13.6 14.2  HCT 45.0   < > 42.9 43.9 46.1 41.2 42.6  MCV 98.7   < > 98.2 98.7 98.9 98.6 97.9  PLT 276   < > 258 263 281 288 269   < > = values in this interval not displayed.   Basic Metabolic Panel: Recent Labs  Lab 07/25/20 2140 07/26/20 1620 07/27/20 0505 07/28/20 0433 07/30/20 0453  NA 135  --  139 137 134*  K 4.5  --  4.0 4.2 3.9  CL 101  --  105 104 99  CO2 23  --  26 24 27   GLUCOSE 111*  --  125* 128* 119*  BUN 15  --  15 22 20   CREATININE 1.05 1.28* 1.07 1.11 0.89  CALCIUM 9.3  --  8.9 9.0 8.8*  MG  --   --   --  2.5*  --     Studies: DG CHEST PORT 1 VIEW  Result Date: 07/30/2020 CLINICAL DATA:  70 year old male status post bronchoscopic biopsy. EXAM: PORTABLE CHEST 1 VIEW COMPARISON:  CTA chest 07/26/2020 and earlier. FINDINGS: Portable AP upright views at 0846 hours. No pneumothorax. Stable cardiac size and mediastinal contours. Visualized tracheal air column is within normal limits. Patchy bibasilar pulmonary opacity is stable since last month. No pulmonary edema or new pulmonary opacity. Stable visualized osseous structures. IMPRESSION: No acute finding or adverse features status post bronchoscopic biopsy. Stable bilateral lung base opacity. Electronically Signed   By: Genevie Ann M.D.   On:  07/30/2020 08:53   DG C-ARM BRONCHOSCOPY  Result Date: 07/30/2020 C-ARM BRONCHOSCOPY: Fluoroscopy was utilized by the requesting physician.  No radiographic interpretation.    Scheduled Meds: . arformoterol  15 mcg Nebulization BID  . aspirin EC  81 mg Oral Daily  . buPROPion  300 mg Oral Daily  . busPIRone  15 mg Oral BID  . cyclobenzaprine  20 mg Oral QHS  . diltiazem  180 mg Oral Daily  . DULoxetine  60 mg Oral BID  . folic acid  1 mg Oral Daily  . furosemide  20 mg Intravenous Once  . gabapentin  900 mg Oral QHS  . heparin  5,000 Units Subcutaneous Q8H  .  insulin aspart  0-15 Units Subcutaneous TID WC  . insulin aspart  0-5 Units Subcutaneous QHS  . lactulose  20 g Oral BID  . lisinopril  20 mg Oral Daily  . lurasidone  40 mg Oral QPC supper  . melatonin  6 mg Oral QHS  . multivitamin with minerals  1 tablet Oral Daily  . mycophenolate  1,500 mg Oral BID  . oxcarbazepine  600 mg Oral TID  . polyethylene glycol  17 g Oral Daily  . predniSONE  40 mg Oral Q breakfast  . revefenacin  175 mcg Nebulization Daily  . senna-docusate  1 tablet Oral BID  . tamsulosin  0.4 mg Oral Daily  . traMADol  100 mg Oral QHS  . traZODone  75 mg Oral QHS   Continuous Infusions: PRN Meds: acetaminophen **OR** acetaminophen, cyclobenzaprine, ipratropium-albuterol, nitroGLYCERIN, ondansetron **OR** ondansetron (ZOFRAN) IV, phenol, polyvinyl alcohol  Time spent: 35 minutes  Author: [name], MD [facility] 07/30/2020 3:20 PM  To reach On-call, see care teams to locate the attending and reach out via [email or web address]. Between 7PM-7AM, please contact night-coverage If you still have difficulty reaching the attending provider, please page the [facility] (Director on Call) for [facility] on amion for assistance.

## 2020-07-30 NOTE — Telephone Encounter (Signed)
appt scheduled for 6/16 at 12 for this pt.

## 2020-07-30 NOTE — Anesthesia Procedure Notes (Signed)
Procedure Name: Intubation Date/Time: 07/30/2020 7:43 AM Performed by: Gaylene Brooks, CRNA Pre-anesthesia Checklist: Patient identified, Emergency Drugs available, Suction available and Patient being monitored Patient Re-evaluated:Patient Re-evaluated prior to induction Oxygen Delivery Method: Circle System Utilized Preoxygenation: Pre-oxygenation with 100% oxygen Induction Type: IV induction Laryngoscope Size: Glidescope and 4 Grade View: Grade I Tube type: Oral Tube size: 8.5 mm Number of attempts: 1 Airway Equipment and Method: Stylet and Oral airway Placement Confirmation: ETT inserted through vocal cords under direct vision,  positive ETCO2 and breath sounds checked- equal and bilateral Secured at: 23 cm Tube secured with: Tape Dental Injury: Teeth and Oropharynx as per pre-operative assessment  Comments: Glidescope used due to pt's large tongue, full beard, poor dentition. AOI. +ETCO2, BBS=.

## 2020-07-30 NOTE — Interval H&P Note (Signed)
History and Physical Interval Note:  07/30/2020 6:52 AM  Mario [name]  has presented today for surgery, with the diagnosis of lung mass.  The various methods of treatment have been discussed with the patient and family. After consideration of risks, benefits and other options for treatment, the patient has consented to  Procedure(s): [place] (Left) as a surgical intervention.  The patient's history has been reviewed, patient examined, no change in status, stable for surgery.  I have reviewed the patient's chart and labs.  Questions were answered to the patient's satisfaction.    I met with patient in pre-op. We discussed the risks, benefits and alternatives of navigational bronchoscopy. He is agrees to proceed. No barriers to proceed at this time.   [place]

## 2020-07-30 NOTE — Anesthesia Postprocedure Evaluation (Signed)
Anesthesia Post Note  Patient: Mario [name]  Procedure(s) Performed: VIDEO BRONCHOSCOPY WITH ENDOBRONCHIAL NAVIGATION (Left ) BRONCHIAL BRUSHINGS BRONCHIAL NEEDLE ASPIRATION BIOPSIES BRONCHIAL BIOPSIES BRONCHIAL WASHINGS     Patient location during evaluation: PACU Anesthesia Type: General Level of consciousness: awake and alert Pain management: pain level controlled Vital Signs Assessment: post-procedure vital signs reviewed and stable Respiratory status: spontaneous breathing, nonlabored ventilation and respiratory function stable Cardiovascular status: blood pressure returned to baseline and stable Postop Assessment: no apparent nausea or vomiting Anesthetic complications: no   No complications documented.  Last Vitals:  Vitals:   07/30/20 1009 07/30/20 1223  BP: 125/77 (!) 142/74  Pulse: 90 97  Resp: (!) 21 19  Temp:  36.7 C  SpO2: 94% 93%    Last Pain:  Vitals:   07/30/20 0910  TempSrc:   PainSc: 0-No pain                 Lidia Collum

## 2020-07-30 NOTE — Op Note (Signed)
Video Bronchoscopy with Electromagnetic Navigation Procedure Note  Date of Operation: 07/30/2020  Pre-op Diagnosis: Left lower lobe nodule  Post-op Diagnosis: Left lower lobe nodule  Surgeon: Garner Nash, DO   Assistants: None   Anesthesia: General endotracheal anesthesia  Operation: Flexible video fiberoptic bronchoscopy with electromagnetic navigation and biopsies.  Estimated Blood Loss: Minimal  Complications: None   Indications and History: Mario [name] is a 70 y.o. male with left lower lobe nodule.  The risks, benefits, complications, treatment options and expected outcomes were discussed with the patient.  The possibilities of pneumothorax, pneumonia, reaction to medication, pulmonary aspiration, perforation of a viscus, bleeding, failure to diagnose a condition and creating a complication requiring transfusion or operation were discussed with the patient who freely signed the consent.    Description of Procedure: The patient was seen in the Preoperative Area, was examined and was deemed appropriate to proceed.  The patient was taken to [facility] endoscopy room 2, identified as Mario [name] and the procedure verified as Flexible Video Fiberoptic Bronchoscopy.  A Time Out was held and the above information confirmed.   Prior to the date of the procedure a high-resolution CT scan of the chest was performed. Utilizing [place] a virtual tracheobronchial tree was generated to allow the creation of distinct navigation pathways to the patient's parenchymal abnormalities. After being taken to the operating room general anesthesia was initiated and the patient  was orally intubated. The video fiberoptic bronchoscope was introduced via the endotracheal tube and a general inspection was performed which showed normal right and left lung anatomy no evidence of endobronchial lesion scattered thick tacky mucous pearls.  Several small ones obstructing the distal segments that were  aspirated clear for visualization of the distal segments.   Target #1 left lower lobe: The extendable working channel and locator guide were introduced into the bronchoscope. The distinct navigation pathways prepared prior to this procedure were then utilized to navigate to within 0.8 cm of patient's lesion(s) identified on CT scan. The extendable working channel was secured into place and the locator guide was withdrawn. Under fluoroscopic guidance transbronchial needle brushings, transbronchial Wang needle biopsies, and transbronchial forceps biopsies were performed to be sent for cytology and pathology. A bronchioalveolar lavage was performed in the left lower lobe and sent for cytology and microbiology (bacterial, fungal, AFB smears and cultures).   Standard therapeutic bronchoscope was inserted into the patient's airway and used for aspiration of the bilateral mainstem's.  This was used for clearance of any remaining blood clots and debris.  All distal subsegments were patent at the termination of the procedure. At the end of the procedure a general airway inspection was performed and there was no evidence of active bleeding. The bronchoscope was removed.  The patient tolerated the procedure well. There was no significant blood loss and there were no obvious complications. A post-procedural chest x-ray is pending.  Samples Target #1 left lower lobe: 1. Transbronchial needle brushings from left lower lobe 2. Transbronchial Wang needle biopsies from left lower lobe 3. Transbronchial forceps biopsies from left lower lobe 4. Bronchoalveolar lavage from left lower lobe  Plans:  The patient will be discharged from the PACU to home when recovered from anesthesia and after chest x-ray is reviewed. We will review the cytology, pathology and microbiology results with the patient when they become available. Outpatient followup will be with Garner Nash, DO.   Garner Nash, DO [facility] 07/30/2020 8:33 AM

## 2020-07-30 NOTE — Progress Notes (Signed)
Report called to [facility], [facility] currently transporting patient back to [facility].  Rowe Pavy, RN

## 2020-07-30 NOTE — Telephone Encounter (Signed)
Please schedule procedure follow up with Dr. Valeta Harms in 1-2 weeks for 15 minute slot.   Thanks, Wille Glaser

## 2020-07-30 NOTE — Consult Note (Signed)
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  PCCM has been consulted for evaluation of a left lower lobe cavitary mass.  Patient has about a 60-pack-year smoking history and quit in 2018 after he had a pneumothorax on the left.  He denies any cough or hemoptysis.  He denies any night sweats, chills, weight loss or loss of appetite.  No family history of lung cancer. He is followed by a pulmonologist at the [facility] clinic for his COPD and uses stiolto daily.   He reports not having a bowel movement over the past 2 weeks. He reports his breathing has worsened over the past week.   CT scans have been reviewed and he has an enlarging left lower lobe mass with cavitatry component first noted in 2018 and is currently 5.7cm.    We discussed the options of pursuing a biopsy of the lung lesion via navigational bronchoscopy and the patient is amenable to having that done while he is in the hospital.    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 Admitted 5/29 . Nav bronch 6/2 by Dr. Valeta Harms  Interim History / Subjective:   Patient is doing well after nav bronch this morning.   He is complaining of neck pain which is a chronic issue, and also arm and leg  weakness over the past 2 weeks since he had a vaccine.   Otherwise his breathing is good right now.   Objective   Blood pressure (!) 142/74, pulse 97, temperature 98 F (36.7 C), resp. rate 19, height '6\' 2"'  (1.88 m), weight (!) 155.5 kg, SpO2 93 %.        Intake/Output Summary (Last 24 hours) at 07/30/2020 1414 Last data filed at 07/30/2020 1257 Gross per 24 hour  Intake 1360 ml  Output 405 ml  Net 955 ml   Filed Weights   07/25/20 1944 07/26/20 1445  Weight: (!) 154.2 kg (!) 155.5 kg    Examination: General: elderly male, obese, no acute distress HENT: [place]/AT, moist mucous membranes, sclera anicteric Lungs: clear to auscultation bilatearlly.  No wheezing or rhonchi.  No rales. Cardiovascular: Regular rate and rhythm, no murmurs. Abdomen: Soft, nontender, nondistended, active bowel sounds present Extremities: Warm, no edema Neuro: Alert, oriented x3, moving all extremities. GU: Deferred  Labs/imaging that I have personally reviewed  (right click and "Reselect all SmartList Selections" daily)   CT Chest scans 09/17/16, 12/26/19, 04/03/20 and 07/26/20  - Centrilobular and panlobular emphysema. 5.7cm mass of left lower lobe near costophrenic sulcus with cavitary component. Initially was a thick rimmed cavitary lesion in 2018.   CXR 6/2 - no post procedure pneumothoraces  Resolved Hospital Problem list     Assessment & Plan:   Left lower lobe cavitary lung mass S/p Nav Bronchoscopy with TBNA 6/2 Given patient's smoking history and age, he is high risk for lung cancer.  The cavitary lesion is less likely inflammatory as HLA-B27 patients typically have fibrocystic changes of the apices.   - No post procedure complications - will arrange outpatient follow up with Dr. Valeta Harms in the next 1 - 2 weeks   COPD with exacerbation - 77m prednisone PO for 5 days and azithromycin for 5 days - Continue yupelri and brovana nebs - He can continue stiolto inhaler as an outpatient.    PCCM  will sign off.   Labs   CBC: Recent Labs  Lab 07/25/20 2140 07/26/20 0448 07/26/20 1620 07/27/20 0505 07/28/20 0433 07/29/20 0438 07/30/20 0453  WBC 12.0*   < > 8.6 7.5 11.5* 11.1* 10.7*  NEUTROABS 6.9  --   --   --  6.4 5.9  --   HGB 15.1   < > 14.3 14.5 15.0 13.6 14.2  HCT 45.0   < > 42.9 43.9 46.1 41.2 42.6  MCV 98.7   < > 98.2 98.7 98.9 98.6 97.9  PLT 276   < > 258 263 281 288 269   < > = values in this interval not displayed.    Basic Metabolic Panel: Recent Labs  Lab 07/25/20 2140 07/26/20 1620 07/27/20 0505 07/28/20 0433 07/30/20 0453  NA 135  --  139 137 134*  K 4.5  --  4.0 4.2 3.9  CL 101  --  105 104 99  CO2 23  --  '26 24 27  ' GLUCOSE 111*  --  125* 128* 119*  BUN 15  --  '15 22 20  ' CREATININE 1.05 1.28* 1.07 1.11 0.89  CALCIUM 9.3  --  8.9 9.0 8.8*  MG  --   --   --  2.5*  --    GFR: Estimated Creatinine Clearance: 125.3 mL/min (by C-G formula based on SCr of 0.89 mg/dL). Recent Labs  Lab 07/27/20 0505 07/28/20 0433 07/29/20 0438 07/30/20 0453  WBC 7.5 11.5* 11.1* 10.7*    Liver Function Tests: Recent Labs  Lab 07/25/20 2140 07/27/20 0505  AST 34 28  ALT 43 37  ALKPHOS 51 49  BILITOT 0.4 0.4  PROT 7.5 6.9  ALBUMIN 4.3 3.9   Recent Labs  Lab 07/25/20 2140  LIPASE 33   No results for input(s): AMMONIA in the last 168 hours.  ABG No results found for: PHART, PCO2ART, PO2ART, HCO3, TCO2, ACIDBASEDEF, O2SAT   Coagulation Profile: No results for input(s): INR, PROTIME in the last 168 hours.  Cardiac Enzymes: No results for input(s): CKTOTAL, CKMB, CKMBINDEX, TROPONINI in the last 168 hours.  HbA1C: Hgb A1c MFr Bld  Date/Time Value Ref Range Status  07/26/2020 04:48 AM 6.9 (H) 4.8 - 5.6 % Final    Comment:    (NOTE)         Prediabetes: 5.7 - 6.4         Diabetes: >6.4         Glycemic control for adults with diabetes: <7.0   01/04/2020 04:45 PM 7.5 (H) 4.8 - 5.6 % Final    Comment:    (NOTE) Pre diabetes:           5.7%-6.4%  Diabetes:              >6.4%  Glycemic control for   <7.0% adults with diabetes     CBG: Recent Labs  Lab 07/29/20 1124 07/29/20 1712 07/29/20 2123 07/30/20 0841 07/30/20 1221  GLUCAP 171* 198* 204* 76 186*    Critical care time: n/a  Freda Jackson, MD [facility] Office: [phone]   See Amion for personal pager PCCM on call pager [phone] until 7pm. Please call [place] 7p-7a. [phone]

## 2020-07-31 ENCOUNTER — Inpatient Hospital Stay (HOSPITAL_COMMUNITY): Payer: Medicare Other

## 2020-07-31 LAB — ACID FAST SMEAR (AFB, MYCOBACTERIA)
Acid Fast Smear: NEGATIVE
Acid Fast Smear: NEGATIVE

## 2020-07-31 LAB — BASIC METABOLIC PANEL
Anion gap: 11 (ref 5–15)
BUN: 21 mg/dL (ref 8–23)
CO2: 26 mmol/L (ref 22–32)
Calcium: 8.9 mg/dL (ref 8.9–10.3)
Chloride: 96 mmol/L — ABNORMAL LOW (ref 98–111)
Creatinine, Ser: 0.94 mg/dL (ref 0.61–1.24)
GFR, Estimated: >60 mL/min (ref >60)
Glucose, Bld: 125 mg/dL — ABNORMAL HIGH (ref 70–99)
Potassium: 4.4 mmol/L (ref 3.5–5.1)
Sodium: 133 mmol/L — ABNORMAL LOW (ref 135–145)

## 2020-07-31 LAB — CBC
HCT: 44.9 % (ref 39.0–52.0)
Hemoglobin: 14.8 g/dL (ref 13.0–17.0)
MCH: 32.7 pg (ref 26.0–34.0)
MCHC: 33 g/dL (ref 30.0–36.0)
MCV: 99.3 fL (ref 80.0–100.0)
Platelets: 268 10*3/uL (ref 150–400)
RBC: 4.52 MIL/uL (ref 4.22–5.81)
RDW: 13.2 % (ref 11.5–15.5)
WBC: 12.4 10*3/uL — ABNORMAL HIGH (ref 4.0–10.5)
nRBC: 0 % (ref 0.0–0.2)

## 2020-07-31 LAB — GLUCOSE, CAPILLARY
Glucose-Capillary: 130 mg/dL — ABNORMAL HIGH (ref 70–99)
Glucose-Capillary: 174 mg/dL — ABNORMAL HIGH (ref 70–99)
Glucose-Capillary: 297 mg/dL — ABNORMAL HIGH (ref 70–99)
Glucose-Capillary: 153 mg/dL — ABNORMAL HIGH (ref 70–99)

## 2020-07-31 LAB — CYTOLOGY - NON PAP

## 2020-07-31 MED ORDER — MAGNESIUM CITRATE PO SOLN
1.0000 | Freq: Once | ORAL | Status: AC
  Administered 2020-07-31: 1 via ORAL
  Filled 2020-07-31: qty 296

## 2020-07-31 MED ORDER — HYDROCOD POLST-CPM POLST ER 10-8 MG/5ML PO SUER
5.0000 mL | Freq: Two times a day (BID) | ORAL | Status: DC | PRN
Start: 2020-07-31 — End: 2020-08-05
  Administered 2020-07-31: 5 mL via ORAL
  Filled 2020-07-31: qty 5

## 2020-07-31 NOTE — Progress Notes (Signed)
SATURATION QUALIFICATIONS: (This note is used to comply with regulatory documentation for home oxygen)  Patient Saturations on Room Air at Rest = 87%  Patient Saturations on Room Air while Ambulating = 87%  Patient Saturations on 2 Liters of oxygen while Ambulating = 95%     Patient Saturations on 2 Liters of oxygen while at Rest = 92%  Please briefly explain why patient needs home oxygen:   Pt has increased oxygen demands at rest and during ambulation. Pt would benefit from home oxygen.

## 2020-07-31 NOTE — Progress Notes (Signed)
[facility] Progress Note  Patient: Mario [name]    FUX:[id number]  DOA: 07/25/2020     Date of Service: the patient was seen and examined on 07/31/2020  Brief hospital course: Past medical history of COPD, HTN, anxiety, DM.  Presents with complaints of constipation and shortness of breath.  Found to have COPD exacerbation.  Chronically using 2 LPM at home.  Currently back on 2 LPM. CTA chest shows evidence of left lower lobe opacity with cavitation.  Currently scheduled for bronchoscopy on 6/2. Currently plan is monitor improvement in respiratory distress  Assessment and Plan: 1.  Acute on chronic hypoxic respiratory failure Acute COPD exacerbation secondary to bronchitis OSA Chronically on 2 LPM use as well as CPAP Initially breathing is improved but now reports worsening shortness of breath again and on 5 LPM 94%. Still exertional dyspnea and dizziness reported by the patient. continue steroids, inhalers as well as antibiotics. 1 dose of IV Lasix and monitor response. will require DME nebulizer at home.  [place] office is closed on weekends. Continue current dose of prednisone for now.  2.  Left lower lobe cavitary lesion Follows up with pulmonary at [facility]. PCCM was consulted. Bronchoscopy and biopsy on 6/2. Tolerated procedure very well other than some sore throat. Postop chest x-ray shows no evidence of acute abnormality or pneumothorax.  3.  Constipation Appears to be a chronic problem. Currently improving.  X-ray continues to show stool burden.  We will add mag citrate and monitor.  4.  Type 2 diabetes mellitus, uncontrolled with hyperglycemia.  Without long-term insulin use without any complication. Hemoglobin A1c 7.5. Blood sugars mildly elevated secondary to steroids. Continue current regimen.  5.  Anxiety and depression. No current complaints. Stable.  Continue current regimen.  6.  Essential hypertension On Cardizem and lisinopril.  Continue both.  7.  Morbid  obesity, OSA Placing the patient at high risk of poor outcome. Follow-up with PCP for diet and lifestyle changes. Body mass index is 44.01 kg/m.   8.  Neck pain. Reportedly chronic in nature. Currently no pain. No radicular symptoms as well.  No tingling or numbness on exam. Sees neurology patient is on Flexeril for the same. Outpatient follow-up with PCP and neurology.  Diet: Carb modified diet DVT Prophylaxis:   heparin injection 5,000 Units Start: 07/26/20 2200  Advance goals of care discussion: Full code  Family Communication: no family was present at bedside, at the time of interview.   Disposition:  Status is: Inpatient  Remains inpatient appropriate because:Ongoing diagnostic testing needed not appropriate for outpatient work up Now on 5 LPM oxygen post bronchoscopy.  Dispo: The patient is from: Home              Anticipated d/c is to: Home              Patient currently is not medically stable to d/c.   Difficult to place patient No   Subjective: Reports abdominal pain from coughing.  No nausea no vomiting.  Continues to report constipation passing gas.  Continues to have shortness of breath.  Reports more cough today.  Physical Exam:  General: Appear in mild distress, no Rash; Oral Mucosa Clear, moist. no Abnormal Neck Mass Or lumps, Conjunctiva normal  Cardiovascular: S1 and S2 Present, no Murmur, Respiratory: good respiratory effort, Bilateral Air entry present and CTA, no Crackles, bilateral expiratory wheezing which improves with cough  Abdomen: Bowel Sound present, Soft and no tenderness Extremities: Bilateral trace pedal edema Neurology: alert  and oriented to time, place, and person affect appropriate. no new focal deficit Gait not checked due to patient safety concerns   Vitals:   07/31/20 0602 07/31/20 0634 07/31/20 1206 07/31/20 1500  BP: 133/82  (!) 143/78   Pulse: 79  85   Resp: 18  18   Temp: 97.7 F (36.5 C)  (!) 97.5 F (36.4 C)   TempSrc:  Oral     SpO2: 100% 100% 96% 95%  Weight:      Height:        Intake/Output Summary (Last 24 hours) at 07/31/2020 1911 Last data filed at 07/31/2020 1207 Gross per 24 hour  Intake 360 ml  Output 950 ml  Net -590 ml   Filed Weights   07/25/20 1944 07/26/20 1445  Weight: (!) 154.2 kg (!) 155.5 kg    Data Reviewed: I have personally reviewed and interpreted daily labs, tele strips, imaging. I reviewed all nursing notes, pharmacy notes, vitals, pertinent old records I have discussed plan of care as described above with RN and patient/family.  CBC: Recent Labs  Lab 07/25/20 2140 07/26/20 0448 07/27/20 0505 07/28/20 0433 07/29/20 0438 07/30/20 0453 07/31/20 0438  WBC 12.0*   < > 7.5 11.5* 11.1* 10.7* 12.4*  NEUTROABS 6.9  --   --  6.4 5.9  --   --   HGB 15.1   < > 14.5 15.0 13.6 14.2 14.8  HCT 45.0   < > 43.9 46.1 41.2 42.6 44.9  MCV 98.7   < > 98.7 98.9 98.6 97.9 99.3  PLT 276   < > 263 281 288 269 268   < > = values in this interval not displayed.   Basic Metabolic Panel: Recent Labs  Lab 07/25/20 2140 07/26/20 1620 07/27/20 0505 07/28/20 0433 07/30/20 0453 07/31/20 0438  NA 135  --  139 137 134* 133*  K 4.5  --  4.0 4.2 3.9 4.4  CL 101  --  105 104 99 96*  CO2 23  --  26 24 27 26   GLUCOSE 111*  --  125* 128* 119* 125*  BUN 15  --  15 22 20 21   CREATININE 1.05 1.28* 1.07 1.11 0.89 0.94  CALCIUM 9.3  --  8.9 9.0 8.8* 8.9  MG  --   --   --  2.5*  --   --     Studies: DG Abd 2 Views  Result Date: 07/31/2020 CLINICAL DATA:  Constipation. EXAM: ABDOMEN - 2 VIEW COMPARISON:  CT abdomen pelvis 07/25/2020 FINDINGS: Detail limited due to large patient size. Multiple images were attempted. Normal bowel gas pattern. Moderate stool in the right and transverse colon. Empty rectum. IMPRESSION: Moderate amount of stool in the colon. Negative for bowel obstruction. Electronically Signed   By: Franchot Gallo M.D.   On: 07/31/2020 14:16    Scheduled Meds: . albuterol  2.5 mg  Nebulization TID  . arformoterol  15 mcg Nebulization BID  . aspirin EC  81 mg Oral Daily  . buPROPion  300 mg Oral Daily  . busPIRone  15 mg Oral BID  . cyclobenzaprine  20 mg Oral QHS  . diltiazem  180 mg Oral Daily  . DULoxetine  60 mg Oral BID  . folic acid  1 mg Oral Daily  . gabapentin  900 mg Oral QHS  . heparin  5,000 Units Subcutaneous Q8H  . insulin aspart  0-15 Units Subcutaneous TID WC  . insulin aspart  0-5 Units Subcutaneous QHS  .  lactulose  20 g Oral BID  . lisinopril  20 mg Oral Daily  . lurasidone  40 mg Oral QPC supper  . melatonin  6 mg Oral QHS  . multivitamin with minerals  1 tablet Oral Daily  . mycophenolate  1,500 mg Oral BID  . oxcarbazepine  600 mg Oral TID  . polyethylene glycol  17 g Oral Daily  . revefenacin  175 mcg Nebulization Daily  . senna-docusate  1 tablet Oral BID  . tamsulosin  0.4 mg Oral Daily  . traMADol  100 mg Oral QHS  . traZODone  75 mg Oral QHS   Continuous Infusions: PRN Meds: acetaminophen **OR** acetaminophen, albuterol, chlorpheniramine-HYDROcodone, cyclobenzaprine, nitroGLYCERIN, ondansetron **OR** ondansetron (ZOFRAN) IV, phenol, polyvinyl alcohol  Time spent: 35 minutes  Author: [name], MD [facility] 07/31/2020 7:11 PM  To reach On-call, see care teams to locate the attending and reach out via [email or web address]. Between 7PM-7AM, please contact night-coverage If you still have difficulty reaching the attending provider, please page the [facility] (Director on Call) for [facility] on amion for assistance.

## 2020-07-31 NOTE — TOC Initial Note (Signed)
Transition of Care [facility]) - Initial/Assessment Note    Patient Details  Name: Mario [name] MRN: [id number] Date of Birth: August 03, 1950  [facility] CM/SW Contact:    Dessa Phi, RN Phone Number: 07/31/2020, 3:49 PM  Clinical Narrative: Spoke to patient about d/c plans-he has [facility]-ordered for neb machine. Patient states he doesn't have anyone to bring - [place] [facility] rep-Brianna-faxed 02 sats to Dr. Radford Pax office fax#[phone], & neb order;faxed to [facility] fax#[phone]. Requested 02 travel tank be brought to hospital;patient can get [facility] machine [facility] any [facility] office.Patient may have to stay in hospital until Monday since [place] office closed on weekends.                 Expected Discharge Plan: Home/Self Care Barriers to Discharge: Continued Medical Work up   Patient Goals and CMS Choice Patient states their goals for this hospitalization and ongoing recovery are:: go home CMS Medicare.gov Compare Post Acute Care list provided to:: Patient Choice offered to / list presented to : Patient  Expected Discharge Plan and Services Expected Discharge Plan: Home/Self Care   Discharge Planning Services: CM Consult Post Acute Care Choice: Durable Medical Equipment (neb machine) Living arrangements for the past 2 months: Apartment                 DME Arranged: Nebulizer machine                    Prior Living Arrangements/Services Living arrangements for the past 2 months: Apartment Lives with:: Roommate Patient language and need for interpreter reviewed:: Yes Do you feel safe going back to the place where you live?: Yes      Need for Family Participation in Patient Care: No (Comment) Care giver support system in place?: Yes (comment) Current home services: DME ([facility]) Criminal Activity/Legal Involvement Pertinent to Current Situation/Hospitalization: No - Comment as needed  Activities of Daily Living Home Assistive  Devices/Equipment: Cane (specify quad or straight) ADL Screening (condition at time of admission) Patient's cognitive ability adequate to safely complete daily activities?: Yes Is the patient deaf or have difficulty hearing?: No Does the patient have difficulty seeing, even when wearing glasses/contacts?: No Does the patient have difficulty concentrating, remembering, or making decisions?: No Patient able to express need for assistance with ADLs?: Yes Does the patient have difficulty dressing or bathing?: No Independently performs ADLs?: Yes (appropriate for developmental age) Does the patient have difficulty walking or climbing stairs?: No Weakness of Legs: Both Weakness of Arms/Hands: None  Permission Sought/Granted Permission sought to share information with : Case Manager Permission granted to share information with : Yes, Verbal Permission Granted  Share Information with NAME: Case manager           Emotional Assessment Appearance:: Appears stated age Attitude/Demeanor/Rapport: Gracious Affect (typically observed): Accepting Orientation: : Oriented to Self,Oriented to Place,Oriented to  Time,Oriented to Situation Alcohol / Substance Use: Not Applicable Psych Involvement: No (comment)  Admission diagnosis:  Shortness of breath [R06.02] Constipation, unspecified constipation type [K59.00] Acute respiratory failure with hypoxemia ([place]) [J96.01] Mass of left lung [R91.8] Patient Active Problem List   Diagnosis Date Noted  . Lung nodule 07/30/2020  . Mass of left lung   . Acute respiratory failure with hypoxemia ([place]) 07/26/2020  . Drug-induced constipation   . CAP (community acquired pneumonia) 04/03/2020  . COPD with acute exacerbation ([place]) 01/05/2020  . Vertigo 12/26/2019  . 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  Stage 3a chronic kidney disease ([place]) 12/26/2019  . Abnormal CT of the chest 12/26/2019  . COPD (chronic obstructive pulmonary disease) ([place])   . AKI (acute  kidney injury) ([place])   . Hyperglycemia 06/23/2019  . OSA (obstructive sleep apnea) 06/23/2019  . Suicidal ideation 06/23/2019  . Major depressive disorder, recurrent episode ([place]) 10/12/2018  . 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 Cellulitis of both feet 07/16/2017  . DM2 (diabetes mellitus, type 2) ([place]) 07/16/2017  . HLA B27 (HLA B27 positive) 07/16/2017  . Acute chest pain   . Hyperkalemia   . Spontaneous pneumothorax 06/27/2016  . Hypoxia   . Pneumothorax on left   . Dyspnea 05/23/2014  . COPD exacerbation ([place]) 05/23/2014  . Malingering 01/21/2013  . Depression 01/11/2013  . Tobacco abuse 01/11/2013  . Obesity, unspecified 01/11/2013  . Chest pain 01/10/2013  . HTN (hypertension) 01/10/2013   PCP:  Clinic, [place]:   [place], [place] - [address] [place] [address] Phone: [phone] Fax: [phone]     Social Determinants of Health (SDOH) Interventions    Readmission Risk Interventions No flowsheet data found.

## 2020-08-01 LAB — CBC
HCT: 43 % (ref 39.0–52.0)
Hemoglobin: 14.1 g/dL (ref 13.0–17.0)
MCH: 32.6 pg (ref 26.0–34.0)
MCHC: 32.8 g/dL (ref 30.0–36.0)
MCV: 99.5 fL (ref 80.0–100.0)
Platelets: 274 10*3/uL (ref 150–400)
RBC: 4.32 MIL/uL (ref 4.22–5.81)
RDW: 13.2 % (ref 11.5–15.5)
WBC: 12.4 10*3/uL — ABNORMAL HIGH (ref 4.0–10.5)
nRBC: 0 % (ref 0.0–0.2)

## 2020-08-01 LAB — GLUCOSE, CAPILLARY
Glucose-Capillary: 117 mg/dL — ABNORMAL HIGH (ref 70–99)
Glucose-Capillary: 125 mg/dL — ABNORMAL HIGH (ref 70–99)
Glucose-Capillary: 89 mg/dL (ref 70–99)
Glucose-Capillary: 127 mg/dL — ABNORMAL HIGH (ref 70–99)

## 2020-08-01 LAB — CULTURE, BAL-QUANTITATIVE W GRAM STAIN: Culture: NO GROWTH

## 2020-08-01 LAB — BASIC METABOLIC PANEL
Anion gap: 9 (ref 5–15)
BUN: 20 mg/dL (ref 8–23)
CO2: 27 mmol/L (ref 22–32)
Calcium: 8.4 mg/dL — ABNORMAL LOW (ref 8.9–10.3)
Chloride: 96 mmol/L — ABNORMAL LOW (ref 98–111)
Creatinine, Ser: 0.9 mg/dL (ref 0.61–1.24)
GFR, Estimated: >60 mL/min (ref >60)
Glucose, Bld: 124 mg/dL — ABNORMAL HIGH (ref 70–99)
Potassium: 4.1 mmol/L (ref 3.5–5.1)
Sodium: 132 mmol/L — ABNORMAL LOW (ref 135–145)

## 2020-08-01 MED ORDER — PEG 3350-KCL-NA BICARB-NACL 420 G PO SOLR
1000.0000 mL | Freq: Once | ORAL | Status: AC
  Administered 2020-08-01: 1000 mL via ORAL

## 2020-08-01 MED ORDER — MAGNESIUM CITRATE PO SOLN: 1.0000 | Freq: Once | ORAL | Status: DC

## 2020-08-01 NOTE — Progress Notes (Signed)
[facility] Progress Note  Patient: Mario [name]    FAO:[id number]  DOA: 07/25/2020     Date of Service: the patient was seen and examined on 08/01/2020  Brief hospital course: Past medical history of COPD, HTN, anxiety, DM.  Presents with complaints of constipation and shortness of breath.  Found to have COPD exacerbation.  Chronically using 2 LPM at home.  Currently back on 2 LPM. CTA chest shows evidence of left lower lobe opacity with cavitation.  Currently scheduled for bronchoscopy on 6/2. Currently plan is monitor improvement in respiratory distress  Assessment and Plan: 1.  Acute on chronic hypoxic respiratory failure Acute COPD exacerbation secondary to bronchitis OSA Chronically on 2 LPM use as well as CPAP Initially breathing is improved but now reports worsening shortness of breath again and on 5 LPM 94%. Still exertional dyspnea and dizziness reported by the patient. continue steroids, inhalers as well as antibiotics. Will continue daily Lasix starting tomorrow. will require DME nebulizer at home.  [place] office is closed on weekends. Continue current dose of prednisone for now.  2.  Left lower lobe cavitary lesion Adenocarcinoma of the lung Follows up with pulmonary at [facility]. PCCM was consulted. Bronchoscopy and biopsy on 6/2.  Cultures so far negative but biopsy positive for adenocarcinoma. Outpatient pulmonary and oncology referral. Tolerated procedure very well other than some sore throat. Postop chest x-ray shows no evidence of acute abnormality or pneumothorax.  3.  Constipation Appears to be a chronic problem. Currently improving.  X-ray continues to show stool burden. No benefit from mag citrate. Will initiate GoLytely.  4.  Type 2 diabetes mellitus, uncontrolled with hyperglycemia.  Without long-term insulin use without any complication. Hemoglobin A1c 7.5. Blood sugars mildly elevated secondary to steroids. Continue current regimen.  5.  Anxiety and  depression. No current complaints. Stable.  Continue current regimen.  6.  Essential hypertension On Cardizem and lisinopril.  Continue both.  7.  Morbid obesity, OSA Placing the patient at high risk of poor outcome. Follow-up with PCP for diet and lifestyle changes. Body mass index is 44.01 kg/m.   8.  Neck pain. Reportedly chronic in nature. Currently no pain. No radicular symptoms as well.  No tingling or numbness on exam. Sees neurology patient is on Flexeril for the same. Outpatient follow-up with PCP and neurology.  Diet: Carb modified diet DVT Prophylaxis:   heparin injection 5,000 Units Start: 07/26/20 2200  Advance goals of care discussion: Full code  Family Communication: no family was present at bedside, at the time of interview.   Disposition:  Status is: Inpatient  Remains inpatient appropriate because:Ongoing diagnostic testing needed not appropriate for outpatient work up Now on 5 LPM oxygen post bronchoscopy.  Dispo: The patient is from: Home              Anticipated d/c is to: Home              Patient currently is not medically stable to d/c.   Difficult to place patient No   Subjective: No nausea no vomiting.  No fever no chills.  No chest pain.  No abdominal pain.  Still reports constipation.  Still reports cough.  Still report shortness of breath.  Physical Exam:  General: Appear in mild distress, no Rash; Oral Mucosa Clear, moist. no Abnormal Neck Mass Or lumps, Conjunctiva normal  Cardiovascular: S1 and S2 Present, no Murmur, Respiratory: good respiratory effort, Bilateral Air entry present and CTA, no Crackles, no wheezes Abdomen: Bowel  Sound present, Soft and no tenderness Extremities: Bilateral pedal edema Neurology: alert and oriented to time, place, and person affect appropriate. no new focal deficit Gait not checked due to patient safety concerns   Vitals:   08/01/20 0838 08/01/20 1253 08/01/20 1440 08/01/20 1724  BP:    130/75   Pulse:  82  86  Resp:  18  18  Temp:  98.2 F (36.8 C)  97.9 F (36.6 C)  TempSrc:  Oral  Oral  SpO2: 97% 98% 90% 95%  Weight:      Height:        Intake/Output Summary (Last 24 hours) at 08/01/2020 1740 Last data filed at 08/01/2020 1230 Gross per 24 hour  Intake 840 ml  Output 300 ml  Net 540 ml   Filed Weights   07/25/20 1944 07/26/20 1445  Weight: (!) 154.2 kg (!) 155.5 kg    Data Reviewed: I have personally reviewed and interpreted daily labs, tele strips, imaging. I reviewed all nursing notes, pharmacy notes, vitals, pertinent old records I have discussed plan of care as described above with RN and patient/family.  CBC: Recent Labs  Lab 07/25/20 2140 07/26/20 0448 07/28/20 0433 07/29/20 0438 07/30/20 0453 07/31/20 0438 08/01/20 0719  WBC 12.0*   < > 11.5* 11.1* 10.7* 12.4* 12.4*  NEUTROABS 6.9  --  6.4 5.9  --   --   --   HGB 15.1   < > 15.0 13.6 14.2 14.8 14.1  HCT 45.0   < > 46.1 41.2 42.6 44.9 43.0  MCV 98.7   < > 98.9 98.6 97.9 99.3 99.5  PLT 276   < > 281 288 269 268 274   < > = values in this interval not displayed.   Basic Metabolic Panel: Recent Labs  Lab 07/27/20 0505 07/28/20 0433 07/30/20 0453 07/31/20 0438 08/01/20 0719  NA 139 137 134* 133* 132*  K 4.0 4.2 3.9 4.4 4.1  CL 105 104 99 96* 96*  CO2 26 24 27 26 27   GLUCOSE 125* 128* 119* 125* 124*  BUN 15 22 20 21 20   CREATININE 1.07 1.11 0.89 0.94 0.90  CALCIUM 8.9 9.0 8.8* 8.9 8.4*  MG  --  2.5*  --   --   --     Studies: No results found.  Scheduled Meds: . albuterol  2.5 mg Nebulization TID  . arformoterol  15 mcg Nebulization BID  . aspirin EC  81 mg Oral Daily  . buPROPion  300 mg Oral Daily  . busPIRone  15 mg Oral BID  . cyclobenzaprine  20 mg Oral QHS  . diltiazem  180 mg Oral Daily  . DULoxetine  60 mg Oral BID  . folic acid  1 mg Oral Daily  . gabapentin  900 mg Oral QHS  . heparin  5,000 Units Subcutaneous Q8H  . insulin aspart  0-15 Units Subcutaneous TID WC  .  insulin aspart  0-5 Units Subcutaneous QHS  . lactulose  20 g Oral BID  . lisinopril  20 mg Oral Daily  . lurasidone  40 mg Oral QPC supper  . melatonin  6 mg Oral QHS  . multivitamin with minerals  1 tablet Oral Daily  . mycophenolate  1,500 mg Oral BID  . oxcarbazepine  600 mg Oral TID  . revefenacin  175 mcg Nebulization Daily  . senna-docusate  1 tablet Oral BID  . tamsulosin  0.4 mg Oral Daily  . traMADol  100 mg Oral QHS  .  traZODone  75 mg Oral QHS   Continuous Infusions: PRN Meds: acetaminophen **OR** acetaminophen, albuterol, chlorpheniramine-HYDROcodone, cyclobenzaprine, nitroGLYCERIN, ondansetron **OR** ondansetron (ZOFRAN) IV, phenol, polyvinyl alcohol  Time spent: 35 minutes  Author: [name], MD [facility] 08/01/2020 5:40 PM  To reach On-call, see care teams to locate the attending and reach out via [email or web address]. Between 7PM-7AM, please contact night-coverage If you still have difficulty reaching the attending provider, please page the [facility] (Director on Call) for [facility] on amion for assistance.

## 2020-08-02 LAB — BASIC METABOLIC PANEL
Anion gap: 8 (ref 5–15)
Anion gap: 9 (ref 5–15)
BUN: 23 mg/dL (ref 8–23)
BUN: 18 mg/dL (ref 8–23)
CO2: 27 mmol/L (ref 22–32)
CO2: 25 mmol/L (ref 22–32)
Calcium: 8.4 mg/dL — ABNORMAL LOW (ref 8.9–10.3)
Calcium: 8.5 mg/dL — ABNORMAL LOW (ref 8.9–10.3)
Chloride: 93 mmol/L — ABNORMAL LOW (ref 98–111)
Chloride: 92 mmol/L — ABNORMAL LOW (ref 98–111)
Creatinine, Ser: 1.09 mg/dL (ref 0.61–1.24)
Creatinine, Ser: 1.13 mg/dL (ref 0.61–1.24)
GFR, Estimated: >60 mL/min (ref >60)
GFR, Estimated: >60 mL/min (ref >60)
Glucose, Bld: 164 mg/dL — ABNORMAL HIGH (ref 70–99)
Glucose, Bld: 120 mg/dL — ABNORMAL HIGH (ref 70–99)
Potassium: 4.3 mmol/L (ref 3.5–5.1)
Potassium: 4.7 mmol/L (ref 3.5–5.1)
Sodium: 128 mmol/L — ABNORMAL LOW (ref 135–145)
Sodium: 126 mmol/L — ABNORMAL LOW (ref 135–145)

## 2020-08-02 LAB — CBC
HCT: 40.1 % (ref 39.0–52.0)
Hemoglobin: 13.3 g/dL (ref 13.0–17.0)
MCH: 32.6 pg (ref 26.0–34.0)
MCHC: 33.2 g/dL (ref 30.0–36.0)
MCV: 98.3 fL (ref 80.0–100.0)
Platelets: 260 10*3/uL (ref 150–400)
RBC: 4.08 MIL/uL — ABNORMAL LOW (ref 4.22–5.81)
RDW: 13.1 % (ref 11.5–15.5)
WBC: 11.6 10*3/uL — ABNORMAL HIGH (ref 4.0–10.5)
nRBC: 0 % (ref 0.0–0.2)

## 2020-08-02 LAB — GLUCOSE, CAPILLARY
Glucose-Capillary: 110 mg/dL — ABNORMAL HIGH (ref 70–99)
Glucose-Capillary: 109 mg/dL — ABNORMAL HIGH (ref 70–99)
Glucose-Capillary: 139 mg/dL — ABNORMAL HIGH (ref 70–99)
Glucose-Capillary: 125 mg/dL — ABNORMAL HIGH (ref 70–99)

## 2020-08-02 LAB — OSMOLALITY: Osmolality: 277 mOsm/kg (ref 275–295)

## 2020-08-02 MED ORDER — POLYETHYLENE GLYCOL 3350 17 G PO PACK
17.0000 g | PACK | Freq: Every day | ORAL | Status: DC
  Administered 2020-08-02 – 2020-08-05 (×4): 17 g via ORAL
  Filled 2020-08-02 (×4): qty 1

## 2020-08-02 MED ORDER — FUROSEMIDE 40 MG PO TABS
40.0000 mg | ORAL_TABLET | Freq: Every day | ORAL | Status: DC
  Administered 2020-08-02 – 2020-08-03 (×2): 40 mg via ORAL
  Filled 2020-08-02 (×2): qty 1

## 2020-08-02 MED ORDER — DOCUSATE SODIUM 100 MG PO CAPS
100.0000 mg | ORAL_CAPSULE | Freq: Two times a day (BID) | ORAL | Status: DC | PRN
  Administered 2020-08-02: 100 mg via ORAL
  Filled 2020-08-02: qty 1

## 2020-08-02 MED ORDER — PREDNISONE 5 MG PO TABS
30.0000 mg | ORAL_TABLET | Freq: Every day | ORAL | Status: DC
  Administered 2020-08-03 – 2020-08-05 (×3): 30 mg via ORAL
  Filled 2020-08-02 (×3): qty 2

## 2020-08-02 MED ORDER — SALINE SPRAY 0.65 % NA SOLN
1.0000 | NASAL | Status: DC | PRN
  Filled 2020-08-02 (×2): qty 44

## 2020-08-02 NOTE — Plan of Care (Signed)
  Problem: Education: Goal: Knowledge of General Education information will improve Description Including pain rating scale, medication(s)/side effects and non-pharmacologic comfort measures Outcome: Progressing   Problem: Health Behavior/Discharge Planning: Goal: Ability to manage health-related needs will improve Outcome: Progressing   

## 2020-08-02 NOTE — Plan of Care (Signed)
  Problem: Safety: Goal: Ability to remain free from injury will improve Outcome: Progressing   Problem: Coping: Goal: Level of anxiety will decrease Outcome: Progressing   Problem: Pain Managment: Goal: General experience of comfort will improve Outcome: Progressing

## 2020-08-02 NOTE — Progress Notes (Signed)
[facility] Progress Note  Patient: Mario [name]    VFI:[id number]  DOA: 07/25/2020     Date of Service: the patient was seen and examined on 08/02/2020  Brief hospital course: Past medical history of COPD, HTN, anxiety, DM.  Presents with complaints of constipation and shortness of breath.  Found to have COPD exacerbation.  Chronically using 2 LPM at home.  Currently back on 2 LPM. CTA chest shows evidence of left lower lobe opacity with cavitation.  Currently scheduled for bronchoscopy on 6/2. Currently plan is monitor and treat hyponatremia  Assessment and Plan: 1.  Acute on chronic hypoxic respiratory failure Acute COPD exacerbation secondary to bronchitis OSA Chronically on 2 LPM use as well as CPAP Initially breathing is improved reportedly. Patient continues to report worsening shortness of breath. Suspect this is patient's new baseline. At rest patient is on 2 LPM but on exertion requires 4-5 LPM. Treated with IV steroids, inhalers and antibiotics. Will continue daily Lasix. will require DME nebulizer at home.  [place] office is closed on weekends. Continue prednisone taper now  2.  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 PCCM was consulted. Bronchoscopy and biopsy on 6/2.  Cultures so far negative but biopsy positive for adenocarcinoma. Outpatient pulmonary and oncology referral. Tolerated procedure very well other than some sore throat. Postop chest x-ray shows no evidence of acute abnormality or pneumothorax.  3.  Constipation Appears to be a chronic problem. Currently improving.  X-ray continues to show stool burden. No benefit from mag citrate. Resolved with GoLytely. Continue MiraLAX scheduled and Colace as needed.  4.  Type 2 diabetes mellitus, uncontrolled with hyperglycemia.  Without long-term insulin use without any complication. Hemoglobin A1c 7.5. Blood sugars mildly elevated secondary to  steroids. Continue current regimen.  5.  Anxiety and depression. No current complaints. Stable.  Continue current regimen.  6.  Essential hypertension On Cardizem and lisinopril.  Continue both.  7.  Morbid obesity, OSA Placing the patient at high risk of poor outcome. Follow-up with PCP for diet and lifestyle changes. Body mass index is 44.01 kg/m.   8.  Neck pain. Reportedly chronic in nature. Currently no pain. No radicular symptoms as well.  No tingling or numbness on exam. Sees neurology patient is on Flexeril for the same. Outpatient follow-up with PCP and neurology.  9.  Hyponatremia. Likely from volume overload. Currently will treat with free water restriction and Lasix. BMP every 6 hours.  Diet: Carb modified diet, fluid restrictions 1200 cc. DVT Prophylaxis:   heparin injection 5,000 Units Start: 07/26/20 2200  Advance goals of care discussion: Full code  Family Communication: no family was present at bedside, at the time of interview.   Disposition:  Status is: Inpatient  Remains inpatient appropriate because:Ongoing diagnostic testing needed not appropriate for outpatient work up Now on 5 LPM oxygen post bronchoscopy.  Dispo: The patient is from: Home              Anticipated d/c is to: Home              Patient currently is not medically stable to d/c.   Difficult to place patient No   Subjective: No nausea no vomiting.  No fever no chills.  Still fatigue and tired.  Physical Exam:  General: Appear in mild distress, no Rash; Oral Mucosa Clear, moist. no Abnormal Neck Mass Or lumps, Conjunctiva normal  Cardiovascular: S1 and S2 Present, no Murmur, Respiratory:  good respiratory effort, Bilateral Air entry present and CTA, no Crackles, no wheezes Abdomen: Bowel Sound present, Soft and no tenderness Extremities: Bilateral pedal edema Neurology: alert and oriented to time, place, and person affect appropriate. no new focal deficit Gait not checked  due to patient safety concerns   Vitals:   08/02/20 0521 08/02/20 0746 08/02/20 1339 08/02/20 1453  BP: 130/70  (!) 109/56   Pulse: 80  88   Resp: 18  18   Temp: 98.2 F (36.8 C)  97.8 F (36.6 C)   TempSrc:      SpO2: 99% 97% 96% 94%  Weight:      Height:        Intake/Output Summary (Last 24 hours) at 08/02/2020 1643 Last data filed at 08/02/2020 1500 Gross per 24 hour  Intake 1550 ml  Output --  Net 1550 ml   Filed Weights   07/25/20 1944 07/26/20 1445  Weight: (!) 154.2 kg (!) 155.5 kg    Data Reviewed: I have personally reviewed and interpreted daily labs, tele strips, imaging. I reviewed all nursing notes, pharmacy notes, vitals, pertinent old records I have discussed plan of care as described above with RN and patient/family.  CBC: Recent Labs  Lab 07/28/20 0433 07/29/20 0438 07/30/20 0453 07/31/20 0438 08/01/20 0719 08/02/20 0220  WBC 11.5* 11.1* 10.7* 12.4* 12.4* 11.6*  NEUTROABS 6.4 5.9  --   --   --   --   HGB 15.0 13.6 14.2 14.8 14.1 13.3  HCT 46.1 41.2 42.6 44.9 43.0 40.1  MCV 98.9 98.6 97.9 99.3 99.5 98.3  PLT 281 288 269 268 274 992   Basic Metabolic Panel: Recent Labs  Lab 07/28/20 0433 07/30/20 0453 07/31/20 0438 08/01/20 0719 08/02/20 0220  NA 137 134* 133* 132* 128*  K 4.2 3.9 4.4 4.1 4.3  CL 104 99 96* 96* 93*  CO2 24 27 26 27 27   GLUCOSE 128* 119* 125* 124* 164*  BUN 22 20 21 20 23   CREATININE 1.11 0.89 0.94 0.90 1.09  CALCIUM 9.0 8.8* 8.9 8.4* 8.4*  MG 2.5*  --   --   --   --     Studies: No results found.  Scheduled Meds: . albuterol  2.5 mg Nebulization TID  . arformoterol  15 mcg Nebulization BID  . aspirin EC  81 mg Oral Daily  . buPROPion  300 mg Oral Daily  . busPIRone  15 mg Oral BID  . cyclobenzaprine  20 mg Oral QHS  . diltiazem  180 mg Oral Daily  . DULoxetine  60 mg Oral BID  . folic acid  1 mg Oral Daily  . gabapentin  900 mg Oral QHS  . heparin  5,000 Units Subcutaneous Q8H  . insulin aspart  0-15 Units  Subcutaneous TID WC  . insulin aspart  0-5 Units Subcutaneous QHS  . lisinopril  20 mg Oral Daily  . lurasidone  40 mg Oral QPC supper  . melatonin  6 mg Oral QHS  . multivitamin with minerals  1 tablet Oral Daily  . mycophenolate  1,500 mg Oral BID  . oxcarbazepine  600 mg Oral TID  . polyethylene glycol  17 g Oral Daily  . revefenacin  175 mcg Nebulization Daily  . tamsulosin  0.4 mg Oral Daily  . traMADol  100 mg Oral QHS  . traZODone  75 mg Oral QHS   Continuous Infusions: PRN Meds: acetaminophen **OR** acetaminophen, albuterol, chlorpheniramine-HYDROcodone, cyclobenzaprine, docusate sodium, nitroGLYCERIN, ondansetron **OR** ondansetron (  ZOFRAN) IV, phenol, polyvinyl alcohol  Time spent: 35 minutes  Author: [name], MD [facility] 08/02/2020 4:43 PM  To reach On-call, see care teams to locate the attending and reach out via [email or web address]. Between 7PM-7AM, please contact night-coverage If you still have difficulty reaching the attending provider, please page the [facility] (Director on Call) for [facility] on amion for assistance.

## 2020-08-03 ENCOUNTER — Encounter (HOSPITAL_COMMUNITY): Payer: Self-pay | Admitting: Pulmonary Disease

## 2020-08-03 LAB — BASIC METABOLIC PANEL
Anion gap: 6 (ref 5–15)
Anion gap: 10 (ref 5–15)
Anion gap: 10 (ref 5–15)
Anion gap: 9 (ref 5–15)
BUN: 17 mg/dL (ref 8–23)
BUN: 16 mg/dL (ref 8–23)
BUN: 21 mg/dL (ref 8–23)
BUN: 24 mg/dL — ABNORMAL HIGH (ref 8–23)
CO2: 28 mmol/L (ref 22–32)
CO2: 25 mmol/L (ref 22–32)
CO2: 27 mmol/L (ref 22–32)
CO2: 26 mmol/L (ref 22–32)
Calcium: 8.8 mg/dL — ABNORMAL LOW (ref 8.9–10.3)
Calcium: 9.1 mg/dL (ref 8.9–10.3)
Calcium: 9.4 mg/dL (ref 8.9–10.3)
Calcium: 8.8 mg/dL — ABNORMAL LOW (ref 8.9–10.3)
Chloride: 93 mmol/L — ABNORMAL LOW (ref 98–111)
Chloride: 89 mmol/L — ABNORMAL LOW (ref 98–111)
Chloride: 91 mmol/L — ABNORMAL LOW (ref 98–111)
Chloride: 91 mmol/L — ABNORMAL LOW (ref 98–111)
Creatinine, Ser: 1.04 mg/dL (ref 0.61–1.24)
Creatinine, Ser: 0.97 mg/dL (ref 0.61–1.24)
Creatinine, Ser: 1.43 mg/dL — ABNORMAL HIGH (ref 0.61–1.24)
Creatinine, Ser: 1.37 mg/dL — ABNORMAL HIGH (ref 0.61–1.24)
GFR, Estimated: >60 mL/min (ref >60)
GFR, Estimated: >60 mL/min (ref >60)
GFR, Estimated: 53 mL/min — ABNORMAL LOW (ref >60)
GFR, Estimated: 56 mL/min — ABNORMAL LOW (ref >60)
Glucose, Bld: 134 mg/dL — ABNORMAL HIGH (ref 70–99)
Glucose, Bld: 162 mg/dL — ABNORMAL HIGH (ref 70–99)
Glucose, Bld: 155 mg/dL — ABNORMAL HIGH (ref 70–99)
Glucose, Bld: 187 mg/dL — ABNORMAL HIGH (ref 70–99)
Potassium: 4.3 mmol/L (ref 3.5–5.1)
Potassium: 5.2 mmol/L — ABNORMAL HIGH (ref 3.5–5.1)
Potassium: 4.8 mmol/L (ref 3.5–5.1)
Potassium: 4.2 mmol/L (ref 3.5–5.1)
Sodium: 127 mmol/L — ABNORMAL LOW (ref 135–145)
Sodium: 124 mmol/L — ABNORMAL LOW (ref 135–145)
Sodium: 128 mmol/L — ABNORMAL LOW (ref 135–145)
Sodium: 126 mmol/L — ABNORMAL LOW (ref 135–145)

## 2020-08-03 LAB — AEROBIC/ANAEROBIC CULTURE W GRAM STAIN (SURGICAL/DEEP WOUND): Culture: NORMAL

## 2020-08-03 LAB — CBC
HCT: 42.8 % (ref 39.0–52.0)
Hemoglobin: 14.5 g/dL (ref 13.0–17.0)
MCH: 32.9 pg (ref 26.0–34.0)
MCHC: 33.9 g/dL (ref 30.0–36.0)
MCV: 97.1 fL (ref 80.0–100.0)
Platelets: 261 10*3/uL (ref 150–400)
RBC: 4.41 MIL/uL (ref 4.22–5.81)
RDW: 13 % (ref 11.5–15.5)
WBC: 11.9 10*3/uL — ABNORMAL HIGH (ref 4.0–10.5)
nRBC: 0 % (ref 0.0–0.2)

## 2020-08-03 LAB — GLUCOSE, CAPILLARY
Glucose-Capillary: 142 mg/dL — ABNORMAL HIGH (ref 70–99)
Glucose-Capillary: 160 mg/dL — ABNORMAL HIGH (ref 70–99)
Glucose-Capillary: 177 mg/dL — ABNORMAL HIGH (ref 70–99)
Glucose-Capillary: 220 mg/dL — ABNORMAL HIGH (ref 70–99)

## 2020-08-03 MED ORDER — SODIUM CHLORIDE 1 G PO TABS
2.0000 g | ORAL_TABLET | Freq: Three times a day (TID) | ORAL | Status: DC
  Administered 2020-08-03 – 2020-08-05 (×6): 2 g via ORAL
  Filled 2020-08-03 (×7): qty 2

## 2020-08-03 MED ORDER — ENSURE ENLIVE PO LIQD
237.0000 mL | Freq: Three times a day (TID) | ORAL | Status: DC
  Administered 2020-08-03 – 2020-08-05 (×6): 237 mL via ORAL

## 2020-08-03 MED ORDER — ENSURE ENLIVE PO LIQD: 237.0000 mL | Freq: Two times a day (BID) | ORAL | Status: DC

## 2020-08-03 NOTE — Plan of Care (Signed)
  Problem: Education: Goal: Knowledge of General Education information will improve Description: Including pain rating scale, medication(s)/side effects and non-pharmacologic comfort measures Outcome: Progressing   Problem: Coping: Goal: Level of anxiety will decrease Outcome: Progressing   

## 2020-08-03 NOTE — Progress Notes (Signed)
Team,   Please ensure follow-up made with Dr. Earlie Server in the oncology office.  Was recently discharged from the hospital.  I did his inpatient bronchoscopy last week.  Pathology consistent with adenocarcinoma of the lung.  He already has an appointment to see me next week in clinic.  Okay to place referral if needed.  Thanks,  BLI  Garner Nash, DO [facility] 08/03/2020 6:30 PM

## 2020-08-03 NOTE — Progress Notes (Signed)
[facility] Progress Note  Patient: Mario [name]    OEU:[id number]  DOA: 07/25/2020     Date of Service: the patient was seen and examined on 08/03/2020  Brief hospital course: Past medical history of COPD, HTN, anxiety, DM.  Presents with complaints of constipation and shortness of breath.  Found to have COPD exacerbation.  Chronically using 2 LPM at home.  Currently back on 2 LPM. CTA chest shows evidence of left lower lobe opacity with cavitation.  Currently scheduled for bronchoscopy on 6/2. Currently plan is monitor and treat hyponatremia.  Assessment and Plan: 1.  Acute on chronic hypoxic respiratory failure Acute COPD exacerbation secondary to bronchitis OSA Chronically on 2 LPM use as well as CPAP Initially breathing is improved reportedly. Patient continues to report worsening shortness of breath. Suspect this is patient's new baseline. At rest patient is on 2 LPM but on exertion requires 4-5 LPM. Treated with IV steroids, inhalers and antibiotics. Continue prednisone taper now.   2.  Left lower lobe cavitary lesion Adenocarcinoma of the lung Follows up with pulmonary at [facility]. PCCM was consulted. Bronchoscopy and biopsy on 6/2.  Cultures so far negative but biopsy positive for adenocarcinoma.  I notified the patient about the diagnosis. Outpatient pulmonary and oncology referral. Tolerated procedure very well other than some sore throat. Postop chest x-ray shows no evidence of acute abnormality or pneumothorax.  3.  Constipation Appears to be a chronic problem. Currently improving.  X-ray continues to show stool burden. No benefit from mag citrate. Resolved with GoLytely. Continue MiraLAX scheduled and Colace as needed.  4.  Type 2 diabetes mellitus, uncontrolled with hyperglycemia.  Without long-term insulin use without any complication. Hemoglobin A1c 7.5. Blood sugars mildly elevated secondary to steroids. Continue current regimen.  5.  Anxiety and  depression. No current complaints. Stable.  Continue current regimen.  6.  Essential hypertension On Cardizem and lisinopril.  Continue both.  7.  Morbid obesity, OSA Placing the patient at high risk of poor outcome. Follow-up with PCP for diet and lifestyle changes. Body mass index is 44.01 kg/m.   8.  Neck pain. Reportedly chronic in nature. Currently no pain. No radicular symptoms as well.  No tingling or numbness on exam. Sees neurology patient is on Flexeril for the same. Outpatient follow-up with PCP and neurology.  9.  Hyponatremia. Likely from volume overload. Continue to free water restriction.  Patient had a piture in the room and appears to be mouth breathing which leads to dry mouth. Also on salt tablets and Ensure.  Monitor.  Diet: Carb modified diet, fluid restrictions 1200 cc. DVT Prophylaxis:   heparin injection 5,000 Units Start: 07/26/20 2200  Advance goals of care discussion: Full code  Family Communication: no family was present at bedside, at the time of interview.   Disposition:  Status is: Inpatient  Remains inpatient appropriate because:Ongoing diagnostic testing needed not appropriate for outpatient work up Now on 5 LPM oxygen post bronchoscopy.  Dispo: The patient is from: Home              Anticipated d/c is to: Home              Patient currently is not medically stable to d/c.   Difficult to place patient No   Subjective: No nausea no vomiting.  No fever no chills.  No BM so far.  Passing gas.  Physical Exam:  General: Appear in mild distress, no Rash; Oral Mucosa Clear, moist. no Abnormal Neck  Mass Or lumps, Conjunctiva normal  Cardiovascular: S1 and S2 Present, no Murmur, Respiratory: good respiratory effort, Bilateral Air entry present and CTA, no Crackles, no wheezes Abdomen: Bowel Sound present, Soft and no tenderness Extremities: no Pedal edema Neurology: alert and oriented to time, place, and person affect appropriate. no new  focal deficit Gait not checked due to patient safety concerns   Vitals:   08/02/20 2149 08/03/20 0604 08/03/20 1335 08/03/20 1357  BP: 121/69 138/83 (!) 126/99   Pulse: 91 64 (!) 103   Resp: 16 18 18    Temp: 97.7 F (36.5 C) 98.4 F (36.9 C) 97.7 F (36.5 C)   TempSrc: Oral Oral Oral   SpO2: 95% 97% 93% 94%  Weight:      Height:        Intake/Output Summary (Last 24 hours) at 08/03/2020 1856 Last data filed at 08/03/2020 1849 Gross per 24 hour  Intake 1200 ml  Output --  Net 1200 ml   Filed Weights   07/25/20 1944 07/26/20 1445  Weight: (!) 154.2 kg (!) 155.5 kg    Data Reviewed: I have personally reviewed and interpreted daily labs, tele strips, imaging. I reviewed all nursing notes, pharmacy notes, vitals, pertinent old records I have discussed plan of care as described above with RN and patient/family.  CBC: Recent Labs  Lab 07/28/20 0433 07/29/20 0438 07/30/20 0453 07/31/20 0438 08/01/20 0719 08/02/20 0220 08/03/20 0327  WBC 11.5* 11.1* 10.7* 12.4* 12.4* 11.6* 11.9*  NEUTROABS 6.4 5.9  --   --   --   --   --   HGB 15.0 13.6 14.2 14.8 14.1 13.3 14.5  HCT 46.1 41.2 42.6 44.9 43.0 40.1 42.8  MCV 98.9 98.6 97.9 99.3 99.5 98.3 97.1  PLT 281 288 269 268 274 260 599   Basic Metabolic Panel: Recent Labs  Lab 07/28/20 0433 07/30/20 0453 08/01/20 0719 08/02/20 0220 08/02/20 1704 08/03/20 0327 08/03/20 1149  NA 137   < > 132* 128* 126* 127* 124*  K 4.2   < > 4.1 4.3 4.7 4.3 5.2*  CL 104   < > 96* 93* 92* 93* 89*  CO2 24   < > 27 27 25 28 25   GLUCOSE 128*   < > 124* 164* 120* 134* 162*  BUN 22   < > 20 23 18 17 16   CREATININE 1.11   < > 0.90 1.09 1.13 1.04 0.97  CALCIUM 9.0   < > 8.4* 8.4* 8.5* 8.8* 9.1  MG 2.5*  --   --   --   --   --   --    < > = values in this interval not displayed.    Studies: No results found.  Scheduled Meds: . albuterol  2.5 mg Nebulization TID  . arformoterol  15 mcg Nebulization BID  . aspirin EC  81 mg Oral Daily  .  buPROPion  300 mg Oral Daily  . busPIRone  15 mg Oral BID  . cyclobenzaprine  20 mg Oral QHS  . diltiazem  180 mg Oral Daily  . DULoxetine  60 mg Oral BID  . feeding supplement  237 mL Oral TID BM  . folic acid  1 mg Oral Daily  . gabapentin  900 mg Oral QHS  . heparin  5,000 Units Subcutaneous Q8H  . insulin aspart  0-15 Units Subcutaneous TID WC  . insulin aspart  0-5 Units Subcutaneous QHS  . lisinopril  20 mg Oral Daily  . lurasidone  40 mg Oral QPC supper  . melatonin  6 mg Oral QHS  . multivitamin with minerals  1 tablet Oral Daily  . mycophenolate  1,500 mg Oral BID  . oxcarbazepine  600 mg Oral TID  . polyethylene glycol  17 g Oral Daily  . predniSONE  30 mg Oral Q breakfast  . revefenacin  175 mcg Nebulization Daily  . sodium chloride  2 g Oral TID WC  . tamsulosin  0.4 mg Oral Daily  . traMADol  100 mg Oral QHS  . traZODone  75 mg Oral QHS   Continuous Infusions: PRN Meds: acetaminophen **OR** acetaminophen, albuterol, chlorpheniramine-HYDROcodone, cyclobenzaprine, docusate sodium, nitroGLYCERIN, ondansetron **OR** ondansetron (ZOFRAN) IV, phenol, polyvinyl alcohol, sodium chloride  Time spent: 35 minutes  Author: [name], MD [facility] 08/03/2020 6:56 PM  To reach On-call, see care teams to locate the attending and reach out via [email or web address]. Between 7PM-7AM, please contact night-coverage If you still have difficulty reaching the attending provider, please page the [facility] (Director on Call) for [facility] on amion for assistance.

## 2020-08-03 NOTE — Progress Notes (Signed)
SATURATION QUALIFICATIONS:   Patient Saturations on Room Air at Rest = 87%  Patient Saturations on [employer] while Ambulating = 88%  Patient Saturations on 3 Liters of oxygen while Ambulating = 93%  Please briefly explain why patient needs home oxygen:  Patient unable to adequately oxygenate while at rest or while ambulating without supplemental oxygen.

## 2020-08-03 NOTE — Progress Notes (Signed)
Discussed CPAP qhs with Pt.  Pt states he will contact his RN to contact respiratory when he is ready for his cpap.

## 2020-08-03 NOTE — Plan of Care (Signed)
  Problem: Education: Goal: Knowledge of General Education information will improve Description Including pain rating scale, medication(s)/side effects and non-pharmacologic comfort measures Outcome: Progressing   

## 2020-08-04 ENCOUNTER — Other Ambulatory Visit: Payer: Self-pay | Admitting: Pulmonary Disease

## 2020-08-04 ENCOUNTER — Other Ambulatory Visit (HOSPITAL_COMMUNITY): Payer: Self-pay

## 2020-08-04 DIAGNOSIS — C801 Malignant (primary) neoplasm, unspecified: Secondary | ICD-10-CM

## 2020-08-04 LAB — CBC
HCT: 41.3 % (ref 39.0–52.0)
Hemoglobin: 14.1 g/dL (ref 13.0–17.0)
MCH: 32.9 pg (ref 26.0–34.0)
MCHC: 34.1 g/dL (ref 30.0–36.0)
MCV: 96.3 fL (ref 80.0–100.0)
Platelets: 264 10*3/uL (ref 150–400)
RBC: 4.29 MIL/uL (ref 4.22–5.81)
RDW: 13.1 % (ref 11.5–15.5)
WBC: 12.4 10*3/uL — ABNORMAL HIGH (ref 4.0–10.5)
nRBC: 0 % (ref 0.0–0.2)

## 2020-08-04 LAB — BASIC METABOLIC PANEL
Anion gap: 9 (ref 5–15)
BUN: 23 mg/dL (ref 8–23)
CO2: 28 mmol/L (ref 22–32)
Calcium: 8.8 mg/dL — ABNORMAL LOW (ref 8.9–10.3)
Chloride: 91 mmol/L — ABNORMAL LOW (ref 98–111)
Creatinine, Ser: 1.18 mg/dL (ref 0.61–1.24)
GFR, Estimated: >60 mL/min (ref >60)
Glucose, Bld: 149 mg/dL — ABNORMAL HIGH (ref 70–99)
Potassium: 4 mmol/L (ref 3.5–5.1)
Sodium: 128 mmol/L — ABNORMAL LOW (ref 135–145)

## 2020-08-04 LAB — GLUCOSE, CAPILLARY
Glucose-Capillary: 180 mg/dL — ABNORMAL HIGH (ref 70–99)
Glucose-Capillary: 161 mg/dL — ABNORMAL HIGH (ref 70–99)
Glucose-Capillary: 207 mg/dL — ABNORMAL HIGH (ref 70–99)
Glucose-Capillary: 229 mg/dL — ABNORMAL HIGH (ref 70–99)

## 2020-08-04 LAB — AEROBIC/ANAEROBIC CULTURE W GRAM STAIN (SURGICAL/DEEP WOUND): Culture: NO GROWTH

## 2020-08-04 MED ORDER — PREDNISONE 10 MG PO TABS: ORAL_TABLET | ORAL | 0 refills | Status: DC

## 2020-08-04 MED ORDER — DOCUSATE SODIUM 100 MG PO CAPS: 200.0000 mg | ORAL_CAPSULE | Freq: Two times a day (BID) | ORAL | 0 refills | Status: DC

## 2020-08-04 MED ORDER — DOCUSATE SODIUM 100 MG PO CAPS
200.0000 mg | ORAL_CAPSULE | Freq: Two times a day (BID) | ORAL | 0 refills | Status: DC
  Filled 2020-08-04: qty 10, 3d supply, fill #0

## 2020-08-04 MED ORDER — ENSURE ENLIVE PO LIQD
237.0000 mL | Freq: Three times a day (TID) | ORAL | 0 refills | Status: DC
  Filled 2020-08-04: qty 10000, 14d supply, fill #0

## 2020-08-04 MED ORDER — ENSURE ENLIVE PO LIQD: 237.0000 mL | Freq: Three times a day (TID) | ORAL | 0 refills | Status: DC

## 2020-08-04 MED ORDER — SODIUM CHLORIDE 1 G PO TABS
1.0000 g | ORAL_TABLET | Freq: Three times a day (TID) | ORAL | 0 refills | Status: AC
  Filled 2020-08-04 – 2020-08-05 (×2): qty 18, 6d supply, fill #0

## 2020-08-04 MED ORDER — PREDNISONE 10 MG PO TABS
ORAL_TABLET | ORAL | 0 refills | Status: DC
  Filled 2020-08-04: qty 18, 9d supply, fill #0

## 2020-08-04 MED ORDER — BISACODYL 5 MG PO TBEC: 5.0000 mg | DELAYED_RELEASE_TABLET | Freq: Every day | ORAL | 0 refills | Status: DC

## 2020-08-04 MED ORDER — BISACODYL 5 MG PO TBEC
5.0000 mg | DELAYED_RELEASE_TABLET | Freq: Every day | ORAL | Status: DC
  Administered 2020-08-04 – 2020-08-05 (×2): 5 mg via ORAL
  Filled 2020-08-04 (×2): qty 1

## 2020-08-04 MED ORDER — BISACODYL 5 MG PO TBEC
5.0000 mg | DELAYED_RELEASE_TABLET | Freq: Every day | ORAL | 0 refills | Status: DC
  Filled 2020-08-04: qty 30, 30d supply, fill #0

## 2020-08-04 MED ORDER — SODIUM CHLORIDE 1 G PO TABS: 1.0000 g | ORAL_TABLET | Freq: Three times a day (TID) | ORAL | 0 refills | Status: AC

## 2020-08-04 MED ORDER — ALBUTEROL SULFATE (2.5 MG/3ML) 0.083% IN NEBU
2.5000 mg | INHALATION_SOLUTION | RESPIRATORY_TRACT | 2 refills | Status: DC | PRN
  Filled 2020-08-04: qty 90, 5d supply, fill #0

## 2020-08-04 MED ORDER — DOCUSATE SODIUM 100 MG PO CAPS
200.0000 mg | ORAL_CAPSULE | Freq: Two times a day (BID) | ORAL | Status: DC
  Administered 2020-08-04 – 2020-08-05 (×3): 200 mg via ORAL
  Filled 2020-08-04 (×4): qty 2

## 2020-08-04 NOTE — Plan of Care (Signed)
  Problem: Education: Goal: Knowledge of General Education information will improve Description: Including pain rating scale, medication(s)/side effects and non-pharmacologic comfort measures Outcome: Progressing   Problem: Clinical Measurements: Goal: Respiratory complications will improve Outcome: Progressing   Problem: Coping: Goal: Level of anxiety will decrease Outcome: Progressing   Problem: Pain Managment: Goal: General experience of comfort will improve Outcome: Progressing   

## 2020-08-04 NOTE — Discharge Summary (Signed)
[facility] Discharge Summary   Patient: Mario [name]:[id number]  PCP: [facility]  Date of admission: 07/25/2020   Date of discharge:  08/04/2020     Discharge Diagnoses:  Principal diagnosis Acute COPD exacerbation with acute on chronic hypoxic respiratory failure Active Problems:   HTN (hypertension)   DM2 (diabetes mellitus, type 2) ([place])   COPD with acute exacerbation ([place])   Acute respiratory failure with hypoxemia (HCC)   Lung nodule   Mass of left lung  Admitted From: Home Disposition:  Home home health  Recommendations for Outpatient Follow-up:  1. PCP: Follow-up with PCP in 1 week.  Follow-up with pulmonary as well as oncology. 2. Follow up LABS/TEST: Repeat BMP in 1 week   Follow-up Information    Icard, Bradley L, DO Follow up on 08/13/2020.   Specialty: Pulmonary Disease Why: Appointment time is 12pm. Please show up 15 minutes before your appointment Contact information: [facility] [address] [phone]        Cl[facility], [facility]. Schedule an appointment as soon as possible for a visit in 1 week(s).   Contact information: [place] [address] [phone]              Discharge Instructions    Diet Carb Modified   Complete by: As directed    Increase activity slowly   Complete by: As directed       Diet recommendation: Cardiac diet  Activity: The patient is advised to gradually reintroduce usual activities, as tolerated  Discharge Condition: stable  Code Status: Full code   History of present illness: As per the H and P dictated on admission, "Mario [name] is a 70 y.o. male with medical history significant of COPD, HTN, anxiety, DM. Presenting with constipation and shortness of breath. He reports that he's not had a BM in 2 weeks. He chronically takes docusate and miralax. He added mag citrate a couple days ago, and he still hasn't had a BM. He has  some global abdominal discomfort. In addition, he has had an increase in shortness of breath over the last few days. He is on CPAP at night w/ 2L [place]. He otherwise normally uses his O2 in a PRN fashion during the day. He has found himself more short of breath whether resting or with activity over the last few days. He has no fever, increased cough, increased peripheral edema, or CP. He became concerned with his symptoms and decided to come to the ED. He denies any other aggravating or alleviating factors.   ED Course: CT ab/pelvis shows significant stool retention. CTA chest was negative for PE but did show a LLL opacity w/ cavitation that is concerning for malignancy. TRH was called for admission. "  Hospital Course:  Summary of his active problems in the hospital is as following. 1.  Acute on chronic hypoxic respiratory failure Acute COPD exacerbation secondary to bronchitis OSA Chronically on 2 LPM use as well as CPAP Initially breathing is improved reportedly. Patient continues to report worsening shortness of breath. Suspect this is patient's new baseline. At rest patient is on 2 LPM but on exertion requires 3 LPM. Treated with IV steroids, inhalers and antibiotics. Continue prednisone taper now.   2.  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 PCCM was consulted. Bronchoscopy and biopsy on 6/2.   Biopsy positive for adenocarcinoma.  I notified the patient about the  diagnosis. Outpatient pulmonary and oncology referral placed by pulmonary. Cultures are growing RARE PREVOTELLA MELANINOGENICA, although since the patient is symptomatically improving therefore no therapy.  3.  Constipation Appears to be a chronic problem. Currently improving.  X-ray continues to show stool burden. No benefit from mag citrate. Resolved with GoLytely. Continue MiraLAX scheduled and Colace as needed.  4.  Type 2 diabetes mellitus, uncontrolled with  hyperglycemia. Without long-term insulin use without any complication. Hemoglobin A1c 7.5. Blood sugars mildly elevated secondary to steroids. Continue current regimen.  5.  Anxiety and depression. No current complaints. Stable.  Continue current regimen.  6.  Essential hypertension On Cardizem and lisinopril.  Continue both.  7.  Morbid obesity, OSA Placing the patient at high risk of poor outcome. Follow-up with PCP for diet and lifestyle changes. Body mass index is 44.01 kg/m.   8.  Neck pain. Reportedly chronic in nature. Currently no pain. No radicular symptoms as well.  No tingling or numbness on exam. Sees neurology patient is on Flexeril for the same. Outpatient follow-up with PCP and neurology.  9.  Hyponatremia. Likely from volume overload. Continue to free water restriction. Patient had a piture in the room and appears to be mouth breathing which leads to dry mouth. Also on salt tablets and Ensure.  Monitor. Currently unable to access medication on discharge, therefore we will monitor sodium treatment in the hospital.  10.  History of rheumatoid arthritis. Patient is on methotrexate, CellCept and Enbrel. Currently medications on hold.  Body mass index is 44.01 kg/m.   Patient was ambulatory without any assistance. On the day of the discharge the patient's vitals were stable, and no other new acute medical condition were reported. The patient was felt safe to be discharge at Home with Home health.  Consultants: PCCM  Procedures: Bronchoscopy and biopsy  DISCHARGE MEDICATION: Allergies as of 08/04/2020      Reactions   Demerol [meperidine] Nausea And Vomiting, Other (See Comments)   Made the patient "violently sick"   Zocor [simvastatin] Nausea And Vomiting, Other (See Comments)   Made him very jittery, also   Beet [beta Vulgaris] Nausea And Vomiting   Codeine    Other reaction(s): NAUSEA,VOMITING   Liver Nausea And Vomiting      Medication List     STOP taking these medications   senna-docusate 8.6-50 MG tablet Commonly known as: Senokot-S     TAKE these medications   acetaminophen 500 MG tablet Commonly known as: TYLENOL Take 1,000 mg by mouth every 6 (six) hours as needed for moderate pain.   albuterol 108 (90 Base) MCG/ACT inhaler Commonly known as: VENTOLIN HFA Inhale 2 puffs into the lungs every 6 (six) hours as needed for wheezing. What changed: Another medication with the same name was added. Make sure you understand how and when to take each.   albuterol (2.5 MG/3ML) 0.083% nebulizer solution Commonly known as: PROVENTIL Inhale 3 mLs (2.5 mg total) by nebulization every 4 (four) hours as needed for wheezing or shortness of breath. What changed: You were already taking a medication with the same name, and this prescription was added. Make sure you understand how and when to take each.   aspirin 81 MG EC tablet Take 81 mg by mouth daily.   b complex vitamins capsule Take 1 capsule by mouth daily.   bisacodyl 5 MG EC tablet Commonly known as: DULCOLAX Take 1 tablet (5 mg total) by mouth daily at 12 noon.   buPROPion 300 MG 24  hr tablet Commonly known as: WELLBUTRIN XL Take 300 mg by mouth daily.   busPIRone 15 MG tablet Commonly known as: BUSPAR Take 15 mg by mouth 2 (two) times daily.   calcium-vitamin D 500-200 MG-UNIT tablet Commonly known as: OSCAL WITH D Take 1 tablet by mouth 2 (two) times daily with a meal.   cyclobenzaprine 10 MG tablet Commonly known as: FLEXERIL Take 20 mg by mouth at bedtime.   diltiazem 180 MG 24 hr capsule Commonly known as: CARDIZEM CD Take 180 mg by mouth daily.   docusate sodium 100 MG capsule Commonly known as: COLACE Take 2 capsules (200 mg total) by mouth 2 (two) times daily. What changed:   how much to take  when to take this  reasons to take this   DULoxetine 60 MG capsule Commonly known as: CYMBALTA Take 1 capsule (60 mg total) by mouth 2 (two) times  daily. What changed: when to take this   Enbrel 50 MG/ML injection Generic drug: etanercept Inject 50 mg into the skin every Wednesday.   feeding supplement Liqd Take 237 mLs by mouth 3 (three) times daily between meals.   fluticasone 50 MCG/ACT nasal spray Commonly known as: FLONASE Place 2 sprays into both nostrils daily.   folic acid 1 MG tablet Commonly known as: FOLVITE Take 1 mg by mouth daily.   gabapentin 300 MG capsule Commonly known as: NEURONTIN Take 900 mg by mouth at bedtime.   Garlic Oil 3 MG Caps Take 3 mg by mouth daily.   lisinopril 20 MG tablet Commonly known as: ZESTRIL Take 20 mg by mouth daily.   lurasidone 40 MG Tabs tablet Commonly known as: LATUDA Take 40 mg by mouth daily after supper.   melatonin 3 MG Tabs tablet Take 6 mg by mouth at bedtime.   metFORMIN 500 MG tablet Commonly known as: GLUCOPHAGE Take 500 mg by mouth 2 (two) times daily with a meal.   methotrexate 2.5 MG tablet Commonly known as: RHEUMATREX Take 6 tablets (15 mg total) by mouth every Saturday. Hold until seen by rheumatology  Caution:Chemotherapy. Protect from light. What changed: additional instructions   multivitamin with minerals Tabs tablet Take 1 tablet by mouth daily.   mycophenolate 250 MG capsule Commonly known as: CELLCEPT Take 1,500 mg by mouth 2 (two) times daily.   nitroGLYCERIN 0.4 MG SL tablet Commonly known as: NITROSTAT Place 1 tablet (0.4 mg total) under the tongue every 5 (five) minutes as needed for chest pain.   OVER THE COUNTER MEDICATION Place 1 drop into both ears daily as needed (itching). Miracell ProEar for irritated itchy ears   OVER THE COUNTER MEDICATION Take 2 tablets by mouth at bedtime. Pure Zzzz   oxcarbazepine 600 MG tablet Commonly known as: TRILEPTAL Take 600 mg by mouth 3 (three) times daily.   polyethylene glycol 17 g packet Commonly known as: MIRALAX / GLYCOLAX Take 17 g by mouth daily.   polyvinyl alcohol 1.4 %  ophthalmic solution Commonly known as: LIQUIFILM TEARS Place 1 drop into both eyes 4 (four) times daily as needed for dry eyes.   predniSONE 10 MG tablet Commonly known as: DELTASONE Take 3 tablets (38m) daily for 3 days, then 2 tablets daily for 3 days, then 1 tablet daily for 3 days, then stop   PRESCRIPTION MEDICATION Inhale into the lungs See admin instructions. CPAP- At bedtime and during during any naps   sodium chloride 0.65 % Soln nasal spray Commonly known as: OCEAN Place 2 sprays  into both nostrils 4 (four) times daily as needed for congestion.   sodium chloride 1 g tablet Take 1 tablet (1 g total) by mouth 3 (three) times daily with a meal for 6 days.   Stiolto Respimat 2.5-2.5 MCG/ACT Aers Generic drug: Tiotropium Bromide-Olodaterol Inhale 2 puffs into the lungs daily.   Striverdi Respimat 2.5 MCG/ACT Aers Generic drug: Olodaterol HCl Inhale 2 puffs into the lungs daily.   Systane Balance 0.6 % Soln Generic drug: Propylene Glycol Place 1 drop into both eyes in the morning, at noon, and at bedtime.   tamsulosin 0.4 MG Caps capsule Commonly known as: FLOMAX Take 0.4 mg by mouth daily.   traMADol 50 MG tablet Commonly known as: ULTRAM Take 100 mg by mouth at bedtime.   traZODone 50 MG tablet Commonly known as: DESYREL Take 75 mg by mouth at bedtime.   VITAMIN A PO Take 1 tablet by mouth daily.   vitamin C 500 MG tablet Commonly known as: ASCORBIC ACID Take 500 mg by mouth 2 (two) times daily.   Vitamin D-3 25 MCG (1000 UT) Caps Take 1,000 Units by mouth daily.            Durable Medical Equipment  (From admission, onward)         Start     Ordered   08/04/20 0940  For home use only DME oxygen  Once       Question Answer Comment  Length of Need Lifetime   Liters per Minute 3   Frequency Continuous (stationary and portable oxygen unit needed)   Oxygen delivery system Gas      08/04/20 0939   07/30/20 1457  For home use only DME Nebulizer  machine  Once       Question Answer Comment  Patient needs a nebulizer to treat with the following condition COPD (chronic obstructive pulmonary disease) ([place])   Length of Need Lifetime      07/30/20 1456          Discharge Exam: Filed Weights   07/25/20 1944 07/26/20 1445  Weight: (!) 154.2 kg (!) 155.5 kg   Vitals:   08/04/20 0838 08/04/20 1300  BP:  137/90  Pulse:  95  Resp:  16  Temp:  97.7 F (36.5 C)  SpO2: 97% 96%   General: Appear in mild distress, no Rash; Oral Mucosa Clear, moist. no Abnormal Neck Mass Or lumps, Conjunctiva normal  Cardiovascular: S1 and S2 Present, no Murmur, Respiratory: good respiratory effort, Bilateral Air entry present and CTA, no Crackles, no wheezes Abdomen: Bowel Sound present, Soft and no tenderness Extremities: Bilateral trace pedal edema Neurology: alert and oriented to time, place, and person affect appropriate. no new focal deficit Gait not checked due to patient safety concerns  The results of significant diagnostics from this hospitalization (including imaging, microbiology, ancillary and laboratory) are listed below for reference.    Significant Diagnostic Studies: CT ABDOMEN PELVIS WO CONTRAST  Result Date: 07/25/2020 CLINICAL DATA:  Constipation for 10 days, abdominal pain EXAM: CT ABDOMEN AND PELVIS WITHOUT CONTRAST TECHNIQUE: Multidetector CT imaging of the abdomen and pelvis was performed following the standard protocol without IV contrast. COMPARISON:  04/03/2020, 12/26/2019 FINDINGS: Lower chest: Bullous emphysematous changes are again seen at the lung bases. Trace left pleural effusion again identified, slightly increased in size since prior study. The somewhat spiculated area of subpleural consolidation within the left lower lobe on prior studies is again identified, now measuring approximately 4.6 x 3.8 x 3.5  cm, increased since 12/26/2019 with this area measured approximately 3.8 x 3.2 x 2.0 cm with less masslike character.  Underlying neoplasm cannot be excluded, and follow-up PET CT may be useful. Hepatobiliary: Unenhanced imaging of the liver and gallbladder demonstrates no focal abnormalities. No evidence of cholelithiasis or cholecystitis. Hepatic steatosis again noted. Pancreas: Unremarkable. No pancreatic ductal dilatation or surrounding inflammatory changes. Spleen: Normal in size without focal abnormality. Adrenals/Urinary Tract: No urinary tract calculi or obstructive uropathy. Bilateral renal hypodensities are noted compatible with cortical cysts. The adrenals are unremarkable. Bladder is minimally distended with no focal abnormality. Stomach/Bowel: No bowel obstruction or ileus. Sigmoid diverticulosis without diverticulitis. There is significant retained stool throughout the colon consistent with given history of constipation. No bowel wall thickening or inflammatory change. Vascular/Lymphatic: Aortic atherosclerosis. No enlarged abdominal or pelvic lymph nodes. Reproductive: Prostate is unremarkable. Other: No free fluid or free intraperitoneal gas. Small fat containing left inguinal hernia unchanged. No bowel herniation. Musculoskeletal: No acute or destructive bony lesions. Reconstructed images demonstrate no additional findings. IMPRESSION: 1. Significant fecal retention consistent with history of constipation. No bowel obstruction or ileus. 2. increase in size of the subpleural left lower lobe consolidation, with increasing masslike character in comparison to 12/24/2019 exam. Neoplasm cannot be excluded, and follow-up PET CT is recommended. 3. Sigmoid diverticulosis without diverticulitis. 4. Trace left pleural effusion, slightly increased in size since prior study. 5. Hepatic steatosis. 6. Aortic Atherosclerosis (ICD10-I70.0) and Emphysema (ICD10-J43.9). Electronically Signed   By: Randa Ngo M.D.   On: 07/25/2020 21:29   DG Chest 2 View  Result Date: 07/22/2020 CLINICAL DATA:  Shortness of breath EXAM: CHEST -  2 VIEW COMPARISON:  04/14/2020 FINDINGS: Cardiac shadow is stable. The lungs are well aerated bilaterally. Bibasilar airspace opacity is noted most consistent with atelectasis. No sizable effusion is seen. Degenerative changes of the thoracic spine are noted. IMPRESSION: Changes of bibasilar atelectasis. Electronically Signed   By: Inez Catalina M.D.   On: 07/22/2020 16:58   DG Lumbar Spine Complete  Result Date: 07/22/2020 CLINICAL DATA:  Low back pain, no known injury, initial encounter EXAM: LUMBAR SPINE - COMPLETE 4+ VIEW COMPARISON:  04/03/2020 FINDINGS: Four non rib-bearing lumbar type vertebral bodies are well visualized. Fifth lumbar vertebra is partially sacralized. Significant disc space narrowing is noted at L4-5. Osteophytic changes are noted throughout the lumbar spine. No anterolisthesis is seen. No pars defects are noted. No soft tissue abnormality is seen. IMPRESSION: Degenerative change without acute abnormality. Electronically Signed   By: Inez Catalina M.D.   On: 07/22/2020 17:01   CT Head Wo Contrast  Result Date: 07/22/2020 CLINICAL DATA:  Weakness. EXAM: CT HEAD WITHOUT CONTRAST TECHNIQUE: Contiguous axial images were obtained from the base of the skull through the vertex without intravenous contrast. COMPARISON:  March 21, 2020. FINDINGS: Brain: No evidence of acute infarction, hemorrhage, hydrocephalus, extra-axial collection or mass lesion/mass effect. Vascular: No hyperdense vessel or unexpected calcification. Skull: Normal. Negative for fracture or focal lesion. Sinuses/Orbits: No acute finding. Other: None. IMPRESSION: No acute intracranial abnormality seen. Electronically Signed   By: Marijo Conception M.D.   On: 07/22/2020 18:52   CT Angio Chest PE W and/or Wo Contrast  Result Date: 07/26/2020 CLINICAL DATA:  Hypoxia.  Pulmonary embolism suspected EXAM: CT ANGIOGRAPHY CHEST WITH CONTRAST TECHNIQUE: Multidetector CT imaging of the chest was performed using the standard protocol  during bolus administration of intravenous contrast. Multiplanar CT image reconstructions and MIPs were obtained to evaluate the vascular anatomy. CONTRAST:  192m OMNIPAQUE IOHEXOL 350 MG/ML SOLN COMPARISON:  04/03/2020 FINDINGS: Cardiovascular: Satisfactory opacification of the pulmonary arteries to the segmental level. No evidence of pulmonary embolism when allowing for levels of obscuration due to motion artifact. Normal heart size. No pericardial effusion. Aortic and coronary atherosclerosis. Mediastinum/Nodes: Negative for adenopathy or mass. Lungs/Pleura: Centrilobular and panlobular emphysema. Chronic opacity with cavitary features the left costophrenic sulcus laterally, up to 5.7 cm. Dimensions are mildly increased from 25956and certainly increased from 2018. 7 mm pulmonary nodule in the left upper lobe is stable from 2021. Upper Abdomen: No acute finding Musculoskeletal: Generalized spondylitic spurring with multilevel bridging. No acute finding. Review of the MIP images confirms the above findings. IMPRESSION: 1. No evidence of pulmonary embolism. Moderate artifact due to respiratory motion. 2. Chronic airspace opacity with cavitation in the left lower lobe, gradually enlarging since 2018. An adenocarcinoma could have this appearance, recommend specific follow-up. 3. Emphysema and pulmonary scarring. Trace pleural fluid on the left. Electronically Signed   By: JMonte FantasiaM.D.   On: 07/26/2020 04:53   DG CHEST PORT 1 VIEW  Result Date: 07/30/2020 CLINICAL DATA:  70year old male status post bronchoscopic biopsy. EXAM: PORTABLE CHEST 1 VIEW COMPARISON:  CTA chest 07/26/2020 and earlier. FINDINGS: Portable AP upright views at 0846 hours. No pneumothorax. Stable cardiac size and mediastinal contours. Visualized tracheal air column is within normal limits. Patchy bibasilar pulmonary opacity is stable since last month. No pulmonary edema or new pulmonary opacity. Stable visualized osseous structures.  IMPRESSION: No acute finding or adverse features status post bronchoscopic biopsy. Stable bilateral lung base opacity. Electronically Signed   By: HGenevie AnnM.D.   On: 07/30/2020 08:53   DG Chest Port 1 View  Result Date: 07/25/2020 CLINICAL DATA:  70year old male with shortness of breath. EXAM: PORTABLE CHEST 1 VIEW COMPARISON:  Chest radiograph dated 07/22/2020. FINDINGS: Background of emphysema. Bibasilar atelectasis/scarring. No focal consolidation, pleural effusion, or pneumothorax. The cardiac silhouette is within normal limits. No acute osseous pathology. IMPRESSION: No active disease. Electronically Signed   By: AAnner CreteM.D.   On: 07/25/2020 23:28   DG Abd 2 Views  Result Date: 07/31/2020 CLINICAL DATA:  Constipation. EXAM: ABDOMEN - 2 VIEW COMPARISON:  CT abdomen pelvis 07/25/2020 FINDINGS: Detail limited due to large patient size. Multiple images were attempted. Normal bowel gas pattern. Moderate stool in the right and transverse colon. Empty rectum. IMPRESSION: Moderate amount of stool in the colon. Negative for bowel obstruction. Electronically Signed   By: CFranchot GalloM.D.   On: 07/31/2020 14:16   DG C-ARM BRONCHOSCOPY  Result Date: 07/30/2020 C-ARM BRONCHOSCOPY: Fluoroscopy was utilized by the requesting physician.  No radiographic interpretation.    Microbiology: Recent Results (from the past 240 hour(s))  Resp Panel by RT-PCR (Flu A&B, Covid) Nasopharyngeal Swab     Status: None   Collection Time: 07/26/20  2:33 AM   Specimen: Nasopharyngeal Swab; Nasopharyngeal(NP) swabs in vial transport medium  Result Value Ref Range Status   SARS Coronavirus 2 by RT PCR NEGATIVE NEGATIVE Final    Comment: (NOTE) SARS-CoV-2 target nucleic acids are NOT DETECTED.  The SARS-CoV-2 RNA is generally detectable in upper respiratory specimens during the acute phase of infection. The lowest concentration of SARS-CoV-2 viral copies this assay can detect is 138 copies/mL. A negative  result does not preclude SARS-Cov-2 infection and should not be used as the sole basis for treatment or other patient management decisions. A negative result may occur with  improper specimen  collection/handling, submission of specimen other than nasopharyngeal swab, presence of viral mutation(s) within the areas targeted by this assay, and inadequate number of viral copies(<138 copies/mL). A negative result must be combined with clinical observations, patient history, and epidemiological information. The expected result is Negative.  Fact Sheet for Patients:  EntrepreneurPulse.com.au  Fact Sheet for Healthcare Providers:  IncredibleEmployment.be  This test is no t yet approved or cleared by the [place] FDA and  has been authorized for detection and/or diagnosis of SARS-CoV-2 by FDA under an Emergency Use Authorization (EUA). This EUA will remain  in effect (meaning this test can be used) for the duration of the COVID-19 declaration under Section 564(b)(1) of the Act, 21 U.S.C.section 360bbb-3(b)(1), unless the authorization is terminated  or revoked sooner.       Influenza A by PCR NEGATIVE NEGATIVE Final   Influenza B by PCR NEGATIVE NEGATIVE Final    Comment: (NOTE) The Xpert Xpress SARS-CoV-2/FLU/RSV plus assay is intended as an aid in the diagnosis of influenza from Nasopharyngeal swab specimens and should not be used as a sole basis for treatment. Nasal washings and aspirates are unacceptable for Xpert Xpress SARS-CoV-2/FLU/RSV testing.  Fact Sheet for Patients: EntrepreneurPulse.com.au  Fact Sheet for Healthcare Providers: IncredibleEmployment.be  This test is not yet approved or cleared by the [place] FDA and has been authorized for detection and/or diagnosis of SARS-CoV-2 by FDA under an Emergency Use Authorization (EUA). This EUA will remain in effect (meaning this test can be used)  for the duration of the COVID-19 declaration under Section 564(b)(1) of the Act, 21 U.S.C. section 360bbb-3(b)(1), unless the authorization is terminated or revoked.  Performed at [facility], [place] [address] [place], [place] [address]   Aerobic/Anaerobic Culture w Gram Stain (surgical/deep wound)     Status: None   Collection Time: 07/30/20  8:13 AM   Specimen: PATH Respiratory biopsy; Lung  Result Value Ref Range Status   Specimen Description LUNG  Final   Special Requests LLL BITES SPEC C  Final   Gram Stain   Final    RARE WBC PRESENT, PREDOMINANTLY MONONUCLEAR NO ORGANISMS SEEN    Culture   Final    No growth aerobically or anaerobically. Performed at [facility], [place] [address], [place], [place] [address]    Report Status 08/04/2020 FINAL  Final  Acid Fast Smear (AFB)     Status: None   Collection Time: 07/30/20  8:13 AM   Specimen: PATH Respiratory biopsy; Lung  Result Value Ref Range Status   AFB Specimen Processing Concentration  Final   Acid Fast Smear Negative  Final    Comment: (NOTE) Performed At: [facility] [place], [place] [id number] Rush Farmer MD [id number]    Source (AFB) TISSUE  Final    Comment: LUNG LLL BITES SPEC C Performed at [facility], [place] [address], [place], [place] [address]   Fungus Culture With Stain     Status: None (Preliminary result)   Collection Time: 07/30/20  8:13 AM  Result Value Ref Range Status   Fungus Stain Final report  Final    Comment: (NOTE) Performed At: [facility] [place], [place] [id number] Rush Farmer MD [id number]    Fungus (Mycology) Culture PENDING  Incomplete   Fungal Source TISSUE  Final    Comment: LUNG LLL BITES SPEC C Performed at [facility], [place] [address] [place], [place] [address]   Fungus  Culture Result     Status: None   Collection Time: 07/30/20  8:13 AM  Result Value Ref Range Status    Result 1 Comment  Final    Comment: (NOTE) KOH/Calcofluor preparation:  no fungus observed. Performed At: [facility] [place], [place] [id number] Rush Farmer MD [id number]   Culture, BAL-quantitative w Gram Stain     Status: None   Collection Time: 07/30/20  8:24 AM   Specimen: Bronchial Alveolar Lavage; Lung  Result Value Ref Range Status   Specimen Description BRONCHIAL ALVEOLAR LAVAGE  Final   Special Requests LLL BAL SPEC D  Final   Gram Stain   Final    FEW WBC PRESENT,BOTH PMN AND MONONUCLEAR NO ORGANISMS SEEN    Culture   Final    NO GROWTH 2 DAYS Performed at [facility], [place] [address], [place], [place] [address]    Report Status 08/01/2020 FINAL  Final  Aerobic/Anaerobic Culture w Gram Stain (surgical/deep wound)     Status: None   Collection Time: 07/30/20  8:24 AM   Specimen: Bronchial Alveolar Lavage; Lung  Result Value Ref Range Status   Specimen Description BRONCHIAL ALVEOLAR LAVAGE  Final   Special Requests LLL BAL SPEC D  Final   Gram Stain   Final    RARE WBC PRESENT,BOTH PMN AND MONONUCLEAR NO ORGANISMS SEEN    Culture   Final    RARE Consistent with normal respiratory flora. RARE PREVOTELLA MELANINOGENICA BETA LACTAMASE POSITIVE Performed at [facility], [place] [address], [place], [place] [address]    Report Status 08/03/2020 FINAL  Final  Acid Fast Smear (AFB)     Status: None   Collection Time: 07/30/20  8:24 AM   Specimen: Bronchial Alveolar Lavage; Lung  Result Value Ref Range Status   AFB Specimen Processing Concentration  Final   Acid Fast Smear Negative  Final    Comment: (NOTE) Performed At: [facility] [place], [place] [id number] Rush Farmer MD [id number]    Source (AFB) BRONCHIAL ALVEOLAR LAVAGE  Final    Comment: LLL BAL SPEC D Performed at [facility], [place] [address] [place], [place] [address]   Fungus Culture With Stain     Status: None (Preliminary  result)   Collection Time: 07/30/20  8:24 AM  Result Value Ref Range Status   Fungus Stain Final report  Final    Comment: (NOTE) Performed At: [facility] [place], [place] [id number] Rush Farmer MD QA:[id number]    Fungus (Mycology) Culture PENDING  Incomplete   Fungal Source BRONCHIAL ALVEOLAR LAVAGE  Final    Comment: LLL BAL SPEC D Performed at [facility], [place] [address], [place], [place] [address]   Fungus Culture Result     Status: None   Collection Time: 07/30/20  8:24 AM  Result Value Ref Range Status   Result 1 Comment  Final    Comment: (NOTE) KOH/Calcofluor preparation:  no fungus observed. Performed At: [facility] [place], [place] [id number] Rush Farmer MD [id number]      Labs: CBC: Recent Labs  Lab 07/29/20 0438 07/30/20 0453 07/31/20 0438 08/01/20 0719 08/02/20 0220 08/03/20 0327 08/04/20 0319  WBC 11.1*   < > 12.4* 12.4* 11.6* 11.9* 12.4*  NEUTROABS 5.9  --   --   --   --   --   --   HGB 13.6   < > 14.8  14.1 13.3 14.5 14.1  HCT 41.2   < > 44.9 43.0 40.1 42.8 41.3  MCV 98.6   < > 99.3 99.5 98.3 97.1 96.3  PLT 288   < > 268 274 260 261 264   < > = values in this interval not displayed.   Basic Metabolic Panel: Recent Labs  Lab 08/03/20 0327 08/03/20 1149 08/03/20 1834 08/03/20 2255 08/04/20 0319  NA 127* 124* 128* 126* 128*  K 4.3 5.2* 4.8 4.2 4.0  CL 93* 89* 91* 91* 91*  CO2 _0 GLUCOSE 134* 162* 155* 187* 149*  BUN _1 24* 23  CREATININE 1.04 0.97 1.43* 1.37* 1.18  CALCIUM 8.8* 9.1 9.4 8.8* 8.8*   Liver Function Tests: No results for input(s): AST, ALT, ALKPHOS, BILITOT, PROT, ALBUMIN in the last 168 hours. CBG: Recent Labs  Lab 08/03/20 1652 08/03/20 2123 08/04/20 0728 08/04/20 1144 08/04/20 1656  GLUCAP 177* 220* 180* 161* 207*    Time spent: 35 minutes  Signed:  [name]  [facility]  08/04/2020 7:25 PM

## 2020-08-04 NOTE — TOC Progression Note (Signed)
  Transition of Care [facility]) - Progression Note    Patient Details  Name: Mario [name] MRN: [id number] Date of Birth: 10-03-1950  Transition of Care [facility]) CM/SW Contact  Lennart Pall, [place] Phone Number: 08/04/2020, 2:32 PM  Clinical Narrative:    Will plan for dc tomorrow due to delay in securing needed medications for dc ([facility] to home - 1 day). O2 tank has been delivered to pt's room.  Should be on track for dc tomorrow.   Expected Discharge Plan: Home/Self Care Barriers to Discharge: Continued Medical Work up  Expected Discharge Plan and Services Expected Discharge Plan: Home/Self Care   Discharge Planning Services: CM Consult Post Acute Care Choice: Durable Medical Equipment (neb machine) Living arrangements for the past 2 months: Apartment Expected Discharge Date: 08/04/20               DME Arranged: Chiropodist                     Social Determinants of Health (SDOH) Interventions    Readmission Risk Interventions No flowsheet data found.

## 2020-08-04 NOTE — Progress Notes (Addendum)
Oxygen Walking Test   O2 87% when walking in the hallway without oxygen, Heart rate 99.   Will continue to monitor.   SWhittemore, RN      SATURATION QUALIFICATIONS: (This note is used to comply with regulatory documentation for home oxygen)  Patient Saturations on Room Air at Rest 89 %  Patient Saturations on Room Air while Ambulating 87%  Patient Saturations on 3 Liters of oxygen while Ambulating 99%  Please briefly explain why patient needs home oxygen: Acute on chronic respiratory failure with hypoxia

## 2020-08-04 NOTE — Progress Notes (Signed)
Patient refused CPAP for the night  

## 2020-08-05 ENCOUNTER — Other Ambulatory Visit (HOSPITAL_COMMUNITY): Payer: Self-pay

## 2020-08-05 DIAGNOSIS — R0602 Shortness of breath: Secondary | ICD-10-CM

## 2020-08-05 DIAGNOSIS — K5900 Constipation, unspecified: Secondary | ICD-10-CM

## 2020-08-05 LAB — BASIC METABOLIC PANEL
Anion gap: 9 (ref 5–15)
BUN: 24 mg/dL — ABNORMAL HIGH (ref 8–23)
CO2: 27 mmol/L (ref 22–32)
Calcium: 9.3 mg/dL (ref 8.9–10.3)
Chloride: 98 mmol/L (ref 98–111)
Creatinine, Ser: 1.1 mg/dL (ref 0.61–1.24)
GFR, Estimated: >60 mL/min (ref >60)
Glucose, Bld: 153 mg/dL — ABNORMAL HIGH (ref 70–99)
Potassium: 4.5 mmol/L (ref 3.5–5.1)
Sodium: 134 mmol/L — ABNORMAL LOW (ref 135–145)

## 2020-08-05 LAB — GLUCOSE, CAPILLARY
Glucose-Capillary: 156 mg/dL — ABNORMAL HIGH (ref 70–99)
Glucose-Capillary: 193 mg/dL — ABNORMAL HIGH (ref 70–99)

## 2020-08-05 LAB — MAGNESIUM: Magnesium: 2.2 mg/dL (ref 1.7–2.4)

## 2020-08-05 LAB — CBC
HCT: 44.1 % (ref 39.0–52.0)
Hemoglobin: 14.3 g/dL (ref 13.0–17.0)
MCH: 32 pg (ref 26.0–34.0)
MCHC: 32.4 g/dL (ref 30.0–36.0)
MCV: 98.7 fL (ref 80.0–100.0)
Platelets: 301 10*3/uL (ref 150–400)
RBC: 4.47 MIL/uL (ref 4.22–5.81)
RDW: 13.3 % (ref 11.5–15.5)
WBC: 12.7 10*3/uL — ABNORMAL HIGH (ref 4.0–10.5)
nRBC: 0 % (ref 0.0–0.2)

## 2020-08-10 ENCOUNTER — Telehealth: Payer: Self-pay | Admitting: *Deleted

## 2020-08-10 NOTE — Telephone Encounter (Signed)
I received referral on Mario [name] today.  I called to schedule him to be seen here at [facility]. He states he does not want to be seen here, he wants to go to [facility]. He states we are not equipped to take care of him.  I listened as he explained. I stated I will update Dr. Valeta Harms to make referral to [facility].

## 2020-08-13 ENCOUNTER — Ambulatory Visit: Payer: Self-pay | Admitting: Pulmonary Disease

## 2020-08-13 ENCOUNTER — Emergency Department (HOSPITAL_COMMUNITY)
Admission: EM | Admit: 2020-08-13 | Discharge: 2020-08-13 | Disposition: A | Discharge status: Home / Self Care | Payer: No Typology Code available for payment source | Attending: Emergency Medicine | Admitting: Emergency Medicine

## 2020-08-13 ENCOUNTER — Telehealth: Payer: Self-pay | Admitting: Pulmonary Disease

## 2020-08-13 DIAGNOSIS — I129 Hypertensive chronic kidney disease with stage 1 through stage 4 chronic kidney disease, or unspecified chronic kidney disease: Secondary | ICD-10-CM | POA: Diagnosis not present

## 2020-08-13 DIAGNOSIS — Z7984 Long term (current) use of oral hypoglycemic drugs: Secondary | ICD-10-CM | POA: Insufficient documentation

## 2020-08-13 DIAGNOSIS — Z79899 Other long term (current) drug therapy: Secondary | ICD-10-CM | POA: Insufficient documentation

## 2020-08-13 DIAGNOSIS — Z87891 Personal history of nicotine dependence: Secondary | ICD-10-CM | POA: Insufficient documentation

## 2020-08-13 DIAGNOSIS — Z7951 Long term (current) use of inhaled steroids: Secondary | ICD-10-CM | POA: Diagnosis not present

## 2020-08-13 DIAGNOSIS — N1831 Chronic kidney disease, stage 3a: Secondary | ICD-10-CM | POA: Diagnosis not present

## 2020-08-13 DIAGNOSIS — R42 Dizziness and giddiness: Secondary | ICD-10-CM | POA: Diagnosis present

## 2020-08-13 DIAGNOSIS — Z7982 Long term (current) use of aspirin: Secondary | ICD-10-CM | POA: Diagnosis not present

## 2020-08-13 DIAGNOSIS — E1122 Type 2 diabetes mellitus with diabetic chronic kidney disease: Secondary | ICD-10-CM | POA: Diagnosis not present

## 2020-08-13 DIAGNOSIS — J441 Chronic obstructive pulmonary disease with (acute) exacerbation: Secondary | ICD-10-CM | POA: Insufficient documentation

## 2020-08-13 LAB — CBC WITH DIFFERENTIAL/PLATELET
Abs Immature Granulocytes: 0.03 10*3/uL (ref 0.00–0.07)
Basophils Absolute: 0.1 10*3/uL (ref 0.0–0.1)
Basophils Relative: 1 %
Eosinophils Absolute: 0.1 10*3/uL (ref 0.0–0.5)
Eosinophils Relative: 1 %
HCT: 44.8 % (ref 39.0–52.0)
Hemoglobin: 14.4 g/dL (ref 13.0–17.0)
Immature Granulocytes: 0 %
Lymphocytes Relative: 24 %
Lymphs Abs: 2.2 10*3/uL (ref 0.7–4.0)
MCH: 31.9 pg (ref 26.0–34.0)
MCHC: 32.1 g/dL (ref 30.0–36.0)
MCV: 99.3 fL (ref 80.0–100.0)
Monocytes Absolute: 1.2 10*3/uL — ABNORMAL HIGH (ref 0.1–1.0)
Monocytes Relative: 13 %
Neutro Abs: 5.5 10*3/uL (ref 1.7–7.7)
Neutrophils Relative %: 61 %
Platelets: 292 10*3/uL (ref 150–400)
RBC: 4.51 MIL/uL (ref 4.22–5.81)
RDW: 13 % (ref 11.5–15.5)
WBC: 9 10*3/uL (ref 4.0–10.5)
nRBC: 0 % (ref 0.0–0.2)

## 2020-08-13 LAB — COMPREHENSIVE METABOLIC PANEL
ALT: 25 U/L (ref 0–44)
AST: 46 U/L — ABNORMAL HIGH (ref 15–41)
Albumin: 4.1 g/dL (ref 3.5–5.0)
Alkaline Phosphatase: 63 U/L (ref 38–126)
Anion gap: 10 (ref 5–15)
BUN: 15 mg/dL (ref 8–23)
CO2: 23 mmol/L (ref 22–32)
Calcium: 9 mg/dL (ref 8.9–10.3)
Chloride: 100 mmol/L (ref 98–111)
Creatinine, Ser: 1.26 mg/dL — ABNORMAL HIGH (ref 0.61–1.24)
GFR, Estimated: >60 mL/min (ref >60)
Glucose, Bld: 161 mg/dL — ABNORMAL HIGH (ref 70–99)
Potassium: 5.8 mmol/L — ABNORMAL HIGH (ref 3.5–5.1)
Sodium: 133 mmol/L — ABNORMAL LOW (ref 135–145)
Total Bilirubin: 1.2 mg/dL (ref 0.3–1.2)
Total Protein: 7.3 g/dL (ref 6.5–8.1)

## 2020-08-13 MED ORDER — LACTATED RINGERS IV SOLN: INTRAVENOUS | Status: DC

## 2020-08-13 NOTE — ED Triage Notes (Signed)
Ems brings pt in from home for dizziness. States he has been feeling dizzy for weeks. Pt was recently in the hospital for low sodium.

## 2020-08-13 NOTE — Telephone Encounter (Signed)
Called and spoke with patient. He has requested to have a copy of his bronch results sent to Dr. Gwenette Greet at the [facility]. Advised him that I would do so.   Nothing further needed at time of call.

## 2020-08-13 NOTE — Telephone Encounter (Signed)
Attempted to call pt but unable to reach. Left message for him to return call. °

## 2020-08-13 NOTE — Discharge Instructions (Addendum)
Follow-up with your doctor for your dizziness and lightheadedness

## 2020-08-13 NOTE — Telephone Encounter (Signed)
Pt needs to contact medical records to obtain disc of all images.  [phone] for medical records  Called and spoke with pt letting him know the above info and he verbalized understanding. When trying to provide pt with # for medical records, he stated that the did not have anything to write the number down with and would have his doctor's nurse contact medical records. Nothing further needed.

## 2020-08-13 NOTE — ED Provider Notes (Signed)
[facility] DEPT Provider Note   CSN: [id number] Arrival date & time: 08/13/20  1741     History Chief Complaint  Patient presents with   Dizziness    Mario [name] is a 70 y.o. male.  70 year old male who presents with dizziness has been going on for several weeks.  States that it waxes and wanes.  Also endorses some lightheadedness.  Denies any recent history of volume loss.  No fever or chills.  History of hyponatremia and was discharged from the hospital 9 days ago.  States he was dizzy and lightheaded during that time to.  States sometimes he gets worse when he tries to stand up.  No recent changes to his medications.  Denies any chest pain or abdominal comfort.  No headaches noted.      Past Medical History:  Diagnosis Date   Anxiety    Bronchitis    COPD (chronic obstructive pulmonary disease) ([place])    Depression    Hypertension    Mental disorder    MI (myocardial infarction) ([place])    ????   OSA (obstructive sleep apnea)    Suicide attempt ([place])    Tension pneumothorax 06/27/2016   Uveitis     Patient Active Problem List   Diagnosis Date Noted   Lung nodule 07/30/2020   Mass of left lung    Acute respiratory failure with hypoxemia ([place]) 07/26/2020   Constipation    CAP (community acquired pneumonia) 04/03/2020   COPD with acute exacerbation ([facility]) 01/05/2020   Vertigo 12/26/2019   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   Stage 3a chronic kidney disease ([place]) 12/26/2019   Abnormal CT of the chest 12/26/2019   COPD (chronic obstructive pulmonary disease) ([place])    AKI (acute kidney injury) ([place])    Hyperglycemia 06/23/2019   OSA (obstructive sleep apnea) 06/23/2019   Suicidal ideation 06/23/2019   Major depressive disorder, recurrent episode ([place]) 10/12/2018   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) 07/16/2017   HLA  B27 (HLA B27 positive) 07/16/2017   Acute chest pain    Hyperkalemia    Spontaneous pneumothorax 06/27/2016   Hypoxia    Pneumothorax on left    Dyspnea 05/23/2014   COPD exacerbation ([place]) 05/23/2014   Malingering 01/21/2013   Depression 01/11/2013   Tobacco abuse 01/11/2013   Obesity, unspecified 01/11/2013   Chest pain 01/10/2013   HTN (hypertension) 01/10/2013    Past Surgical History:  Procedure Laterality Date   BRONCHIAL BIOPSY  07/30/2020   Procedure: BRONCHIAL BIOPSIES;  Surgeon: Garner Nash, DO;  Location: [facility];  Service: Pulmonary;;   BRONCHIAL BRUSHINGS  07/30/2020   Procedure: BRONCHIAL BRUSHINGS;  Surgeon: Garner Nash, DO;  Location: [facility];  Service: Pulmonary;;   BRONCHIAL NEEDLE ASPIRATION BIOPSY  07/30/2020   Procedure: BRONCHIAL NEEDLE ASPIRATION BIOPSIES;  Surgeon: Garner Nash, DO;  Location: [facility];  Service: Pulmonary;;   BRONCHIAL WASHINGS  07/30/2020   Procedure: BRONCHIAL WASHINGS;  Surgeon: Garner Nash, DO;  Location: [facility];  Service: Pulmonary;;   CHEST TUBE INSERTION Left 06/27/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     SKIN CANCER EXCISION     VIDEO BRONCHOSCOPY WITH ENDOBRONCHIAL NAVIGATION Left 07/30/2020   Procedure: VIDEO BRONCHOSCOPY WITH ENDOBRONCHIAL NAVIGATION;  Surgeon: Garner Nash, DO;  Location: [facility];  Service: Pulmonary;  Laterality: Left;  Family History  Problem Relation Age of Onset   Dementia Father     Social History   Tobacco Use   Smoking status: Former    Packs/day: 1.00    Years: 35.00    Pack years: 35.00    Types: Cigarettes    Quit date: 05/2016    Years since quitting: 4.2   Smokeless tobacco: Never  Vaping Use   Vaping Use: Never used  Substance Use Topics   Alcohol use: No    Alcohol/week: 0.0 standard drinks    Comment: denies use of any drugs or alcohol   Drug use: No    Home Medications Prior to Admission medications   Medication Sig Start Date End Date  Taking? Authorizing Provider  acetaminophen (TYLENOL) 500 MG tablet Take 1,000 mg by mouth every 6 (six) hours as needed for moderate pain.    [provider]  albuterol (PROVENTIL) (2.5 MG/3ML) 0.083% nebulizer solution Inhale 3 mLs (2.5 mg total) by nebulization every 4 (four) hours as needed for wheezing or shortness of breath. 08/04/20 08/04/21  Lavina Hamman, MD  albuterol (VENTOLIN HFA) 108 (90 Base) MCG/ACT inhaler Inhale 2 puffs into the lungs every 6 (six) hours as needed for wheezing. 06/02/20   [provider]  aspirin 81 MG EC tablet Take 81 mg by mouth daily.    [provider]  b complex vitamins capsule Take 1 capsule by mouth daily.    [provider]  bisacodyl (DULCOLAX) 5 MG EC tablet Take 1 tablet (5 mg total) by mouth daily at 12 noon. 08/04/20   Lavina Hamman, MD  buPROPion (WELLBUTRIN XL) 300 MG 24 hr tablet Take 300 mg by mouth daily. 02/05/20   [provider]  busPIRone (BUSPAR) 15 MG tablet Take 15 mg by mouth 2 (two) times daily.    [provider]  calcium-vitamin D (OSCAL WITH D) 500-200 MG-UNIT tablet Take 1 tablet by mouth 2 (two) times daily with a meal.    [provider]  Cholecalciferol (VITAMIN D-3) 25 MCG (1000 UT) CAPS Take 1,000 Units by mouth daily.    [provider]  cyclobenzaprine (FLEXERIL) 10 MG tablet Take 20 mg by mouth at bedtime.    [provider]  diltiazem (CARDIZEM CD) 180 MG 24 hr capsule Take 180 mg by mouth daily.    [provider]  docusate sodium (COLACE) 100 MG capsule Take 2 capsules (200 mg total) by mouth 2 (two) times daily. 08/04/20   Lavina Hamman, MD  DULoxetine (CYMBALTA) 60 MG capsule Take 1 capsule (60 mg total) by mouth 2 (two) times daily. Patient taking differently: Take 60 mg by mouth 2 (two) times daily with a meal. 07/21/17   Lavina Hamman, MD  etanercept (ENBREL) 50 MG/ML injection Inject 50 mg into the skin every Wednesday. Patient not  taking: Reported on 07/26/2020    [provider]  feeding supplement (ENSURE ENLIVE / ENSURE PLUS) LIQD Take 237 mLs by mouth 3 (three) times daily between meals. 08/04/20   Lavina Hamman, MD  fluticasone Asencion Islam) 50 MCG/ACT nasal spray Place 2 sprays into both nostrils daily.     [provider]  folic acid (FOLVITE) 1 MG tablet Take 1 mg by mouth daily.    [provider]  gabapentin (NEURONTIN) 300 MG capsule Take 900 mg by mouth at bedtime.    [provider]  Garlic Oil 3 MG CAPS Take 3 mg by mouth daily.  [provider]  lisinopril (ZESTRIL) 20 MG tablet Take 20 mg by mouth daily.    [provider]  lurasidone (LATUDA) 40 MG TABS tablet Take 40 mg by mouth daily after supper.    [provider]  Melatonin 3 MG TABS Take 6 mg by mouth at bedtime.    [provider]  metFORMIN (GLUCOPHAGE) 500 MG tablet Take 500 mg by mouth 2 (two) times daily with a meal.    [provider]  methotrexate (RHEUMATREX) 2.5 MG tablet Take 6 tablets (15 mg total) by mouth every Saturday. Hold until seen by rheumatology  Caution:Chemotherapy. Protect from light. Patient taking differently: Take 15 mg by mouth every Saturday. 07/22/17   Lavina Hamman, MD  Multiple Vitamin (MULTIVITAMIN WITH MINERALS) TABS tablet Take 1 tablet by mouth daily.    [provider]  mycophenolate (CELLCEPT) 250 MG capsule Take 1,500 mg by mouth 2 (two) times daily.    [provider]  nitroGLYCERIN (NITROSTAT) 0.4 MG SL tablet Place 1 tablet (0.4 mg total) under the tongue every 5 (five) minutes as needed for chest pain. 07/03/19   Danford, Suann Larry, MD  Olodaterol HCl (STRIVERDI RESPIMAT) 2.5 MCG/ACT AERS Inhale 2 puffs into the lungs daily.    [provider]  OVER THE COUNTER MEDICATION Place 1 drop into both ears daily as needed (itching). Miracell ProEar for irritated itchy ears    [provider]  OVER THE  COUNTER MEDICATION Take 2 tablets by mouth at bedtime. Pure Zzzz    [provider]  oxcarbazepine (TRILEPTAL) 600 MG tablet Take 600 mg by mouth 3 (three) times daily. 07/16/20   [provider]  polyethylene glycol (MIRALAX / GLYCOLAX) 17 g packet Take 17 g by mouth daily.    [provider]  polyvinyl alcohol (LIQUIFILM TEARS) 1.4 % ophthalmic solution Place 1 drop into both eyes 4 (four) times daily as needed for dry eyes.    [provider]  predniSONE (DELTASONE) 10 MG tablet Take 3 tablets (64m) daily for 3 days, then 2 tablets daily for 3 days, then 1 tablet daily for 3 days, then stop 08/04/20   PLavina Hamman MD  PRESCRIPTION MEDICATION Inhale into the lungs See admin instructions. CPAP- At bedtime and during during any naps    [provider]  Propylene Glycol (SYSTANE BALANCE) 0.6 % SOLN Place 1 drop into both eyes in the morning, at noon, and at bedtime.     [provider]  sodium chloride (OCEAN) 0.65 % SOLN nasal spray Place 2 sprays into both nostrils 4 (four) times daily as needed for congestion.    [provider]  tamsulosin (FLOMAX) 0.4 MG CAPS capsule Take 0.4 mg by mouth daily.    [provider]  Tiotropium Bromide-Olodaterol (STIOLTO RESPIMAT) 2.5-2.5 MCG/ACT AERS Inhale 2 puffs into the lungs daily.    [provider]  traMADol (ULTRAM) 50 MG tablet Take 100 mg by mouth at bedtime.    [provider]  traZODone (DESYREL) 50 MG tablet Take 75 mg by mouth at bedtime.    [provider]  VITAMIN A PO Take 1 tablet by mouth daily.    [provider]  vitamin C (ASCORBIC ACID) 500 MG tablet Take 500 mg by mouth 2 (two) times daily.     [provider]    Allergies    Demerol [meperidine], Zocor [simvastatin], Beet [beta vulgaris], Codeine, and Liver  Review of Systems  Review of Systems  All other systems reviewed and are negative.  Physical Exam Updated  Vital Signs BP 120/67 (BP Location: Left Arm)   Pulse (!) 101   Temp 98.4 F (36.9 C) (Oral)   Resp 20   SpO2 92%   Physical Exam Vitals and nursing note reviewed.  Constitutional:      General: He is not in acute distress.    Appearance: Normal appearance. He is well-developed. He is not toxic-appearing.  HENT:     Head: Normocephalic and atraumatic.  Eyes:     General: Lids are normal.     Conjunctiva/sclera: Conjunctivae normal.     Pupils: Pupils are equal, round, and reactive to light.  Neck:     Thyroid: No thyroid mass.     Trachea: No tracheal deviation.  Cardiovascular:     Rate and Rhythm: Normal rate and regular rhythm.     Heart sounds: Normal heart sounds. No murmur heard.   No gallop.  Pulmonary:     Effort: Pulmonary effort is normal. No respiratory distress.     Breath sounds: Normal breath sounds. No stridor. No decreased breath sounds, wheezing, rhonchi or rales.  Abdominal:     General: There is no distension.     Palpations: Abdomen is soft.     Tenderness: There is no abdominal tenderness. There is no rebound.  Musculoskeletal:        General: No tenderness. Normal range of motion.     Cervical back: Normal range of motion and neck supple.  Skin:    General: Skin is warm and dry.     Findings: No abrasion or rash.  Neurological:     General: No focal deficit present.     Mental Status: He is alert and oriented to person, place, and time. Mental status is at baseline.     GCS: GCS eye subscore is 4. GCS verbal subscore is 5. GCS motor subscore is 6.     Cranial Nerves: Cranial nerves are intact. No cranial nerve deficit.     Sensory: No sensory deficit.     Motor: Motor function is intact.     Gait: Gait is intact.  Psychiatric:        Attention and Perception: Attention normal.        Speech: Speech normal.        Behavior: Behavior normal.    ED Results / Procedures / Treatments   Labs (all labs ordered are listed, but only abnormal results  are displayed) Labs Reviewed  CBC WITH DIFFERENTIAL/PLATELET  COMPREHENSIVE METABOLIC PANEL    EKG EKG Interpretation  Date/Time:  Thursday August 13 2020 18:24:06 EDT Ventricular Rate:  98 PR Interval:  196 QRS Duration: 108 QT Interval:  334 QTC Calculation: 427 R Axis:   16 Text Interpretation: Sinus rhythm Low voltage, precordial leads Abnormal R-wave progression, early transition Confirmed by Lacretia Leigh ([id number]) on 08/13/2020 8:02:20 PM  Radiology No results found.  Procedures Procedures   Medications Ordered in ED Medications  lactated ringers infusion (has no administration in time range)    ED Course  I have reviewed the triage vital signs and the nursing notes.  Pertinent labs & imaging results that were available during my care of the patient were reviewed by me and considered in my medical decision making (see chart for details).    MDM Rules/Calculators/A&P  Patient is EKG without ischemic changes.  Sodium is stable at 133.  Potassium is hemolyzed.  BUN and creatinine are stable.  States he has been dizzy and lightheaded for over 4 weeks now.  States his main reason for coming here was due to frustration.  Will refer him back to his doctor Final Clinical Impression(s) / ED Diagnoses Final diagnoses:  None    Rx / DC Orders ED Discharge Orders     None        Lacretia Leigh, MD 08/13/20 2003

## 2020-08-13 NOTE — Progress Notes (Deleted)
Synopsis: Referred in June 2022 for post bronchoscopy follow-up, PCP: [facility]  Subjective:   PATIENT ID: Mario [name] GENDER: male DOB: 1950-04-24, MRN: [id number]  No chief complaint on file.   Is a 70 year old gentleman, obese, hypertension, COPD, former smoker quit in 2018.At [facility].  Patient had a recent hospitalization he was taken on 07/30/2020 for bronchoscopy.  Presentation imaging to the hospital on 07/26/2020 revealed a chronic airspace disease with early cavitation from a 2018 CT scan.  Decision was made to take for navigational bronchoscopy with tissue biopsy.  Biopsy results returned positive for adenocarcinoma.  Subsequently the patient has been discharged from the hospital.    Past Medical History:  Diagnosis Date   Anxiety    Bronchitis    COPD (chronic obstructive pulmonary disease) ([place])    Depression    Hypertension    Mental disorder    MI (myocardial infarction) ([place])    ????   OSA (obstructive sleep apnea)    Suicide attempt ([place])    Tension pneumothorax 06/27/2016   Uveitis      Family History  Problem Relation Age of Onset   Dementia Father      Past Surgical History:  Procedure Laterality Date   BRONCHIAL BIOPSY  07/30/2020   Procedure: BRONCHIAL BIOPSIES;  Surgeon: Garner Nash, DO;  Location: [facility];  Service: Pulmonary;;   BRONCHIAL BRUSHINGS  07/30/2020   Procedure: BRONCHIAL BRUSHINGS;  Surgeon: Garner Nash, DO;  Location: [facility];  Service: Pulmonary;;   BRONCHIAL NEEDLE ASPIRATION BIOPSY  07/30/2020   Procedure: BRONCHIAL NEEDLE ASPIRATION BIOPSIES;  Surgeon: Garner Nash, DO;  Location: [facility];  Service: Pulmonary;;   BRONCHIAL WASHINGS  07/30/2020   Procedure: BRONCHIAL WASHINGS;  Surgeon: Garner Nash, DO;  Location: [facility];  Service: Pulmonary;;   CHEST TUBE INSERTION Left 06/27/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     SKIN CANCER EXCISION     VIDEO BRONCHOSCOPY WITH ENDOBRONCHIAL NAVIGATION  Left 07/30/2020   Procedure: VIDEO BRONCHOSCOPY WITH ENDOBRONCHIAL NAVIGATION;  Surgeon: Garner Nash, DO;  Location: [facility];  Service: Pulmonary;  Laterality: Left;    Social History   Socioeconomic History   Marital status: Single    Spouse name: Not on file   Number of children: Not on file   Years of education: Not on file   Highest education level: Not on file  Occupational History   Occupation: retired  Tobacco Use   Smoking status: Former    Packs/day: 1.00    Years: 35.00    Pack years: 35.00    Types: Cigarettes    Quit date: 05/2016    Years since quitting: 4.2   Smokeless tobacco: Never  Vaping Use   Vaping Use: Never used  Substance and Sexual Activity   Alcohol use: No    Alcohol/week: 0.0 standard drinks    Comment: denies use of any drugs or alcohol   Drug use: No   Sexual activity: Not on file  Other Topics Concern   Not on file  Social History Narrative   Not on file   Social Determinants of Health   Financial Resource Strain: Not on file  Food Insecurity: Not on file  Transportation Needs: Not on file  Physical Activity: Not on file  Stress: Not on file  Social Connections: Not on file  Intimate Partner Violence: Not on file     Allergies  Allergen Reactions   Demerol [Meperidine] Nausea And Vomiting and  Other (See Comments)    Made the patient "violently sick"   Zocor [Simvastatin] Nausea And Vomiting and Other (See Comments)    Made him very jittery, also   Beet [Beta Vulgaris] Nausea And Vomiting   Codeine     Other reaction(s): NAUSEA,VOMITING   Liver Nausea And Vomiting     Outpatient Medications Prior to Visit  Medication Sig Dispense Refill   acetaminophen (TYLENOL) 500 MG tablet Take 1,000 mg by mouth every 6 (six) hours as needed for moderate pain.     albuterol (PROVENTIL) (2.5 MG/3ML) 0.083% nebulizer solution Inhale 3 mLs (2.5 mg total) by nebulization every 4 (four) hours as needed for wheezing or shortness of  breath. 90 mL 2   albuterol (VENTOLIN HFA) 108 (90 Base) MCG/ACT inhaler Inhale 2 puffs into the lungs every 6 (six) hours as needed for wheezing.     aspirin 81 MG EC tablet Take 81 mg by mouth daily.     b complex vitamins capsule Take 1 capsule by mouth daily.     bisacodyl (DULCOLAX) 5 MG EC tablet Take 1 tablet (5 mg total) by mouth daily at 12 noon. 30 tablet 0   buPROPion (WELLBUTRIN XL) 300 MG 24 hr tablet Take 300 mg by mouth daily.     busPIRone (BUSPAR) 15 MG tablet Take 15 mg by mouth 2 (two) times daily.     calcium-vitamin D (OSCAL WITH D) 500-200 MG-UNIT tablet Take 1 tablet by mouth 2 (two) times daily with a meal.     Cholecalciferol (VITAMIN D-3) 25 MCG (1000 UT) CAPS Take 1,000 Units by mouth daily.     cyclobenzaprine (FLEXERIL) 10 MG tablet Take 20 mg by mouth at bedtime.     diltiazem (CARDIZEM CD) 180 MG 24 hr capsule Take 180 mg by mouth daily.     docusate sodium (COLACE) 100 MG capsule Take 2 capsules (200 mg total) by mouth 2 (two) times daily. 10 capsule 0   DULoxetine (CYMBALTA) 60 MG capsule Take 1 capsule (60 mg total) by mouth 2 (two) times daily. (Patient taking differently: Take 60 mg by mouth 2 (two) times daily with a meal.) 60 capsule 0   etanercept (ENBREL) 50 MG/ML injection Inject 50 mg into the skin every Wednesday. (Patient not taking: Reported on 07/26/2020)     feeding supplement (ENSURE ENLIVE / ENSURE PLUS) LIQD Take 237 mLs by mouth 3 (three) times daily between meals. 10000 mL 0   fluticasone (FLONASE) 50 MCG/ACT nasal spray Place 2 sprays into both nostrils daily.      folic acid (FOLVITE) 1 MG tablet Take 1 mg by mouth daily.     gabapentin (NEURONTIN) 300 MG capsule Take 900 mg by mouth at bedtime.     Garlic Oil 3 MG CAPS Take 3 mg by mouth daily.     lisinopril (ZESTRIL) 20 MG tablet Take 20 mg by mouth daily.     lurasidone (LATUDA) 40 MG TABS tablet Take 40 mg by mouth daily after supper.     Melatonin 3 MG TABS Take 6 mg by mouth at bedtime.      metFORMIN (GLUCOPHAGE) 500 MG tablet Take 500 mg by mouth 2 (two) times daily with a meal.     methotrexate (RHEUMATREX) 2.5 MG tablet Take 6 tablets (15 mg total) by mouth every Saturday. Hold until seen by rheumatology  Caution:Chemotherapy. Protect from light. (Patient taking differently: Take 15 mg by mouth every Saturday.) 4 tablet 0   Multiple Vitamin (  MULTIVITAMIN WITH MINERALS) TABS tablet Take 1 tablet by mouth daily.     mycophenolate (CELLCEPT) 250 MG capsule Take 1,500 mg by mouth 2 (two) times daily.     nitroGLYCERIN (NITROSTAT) 0.4 MG SL tablet Place 1 tablet (0.4 mg total) under the tongue every 5 (five) minutes as needed for chest pain. 12 tablet 12   Olodaterol HCl (STRIVERDI RESPIMAT) 2.5 MCG/ACT AERS Inhale 2 puffs into the lungs daily.     OVER THE COUNTER MEDICATION Place 1 drop into both ears daily as needed (itching). Miracell ProEar for irritated itchy ears     OVER THE COUNTER MEDICATION Take 2 tablets by mouth at bedtime. Pure Zzzz     oxcarbazepine (TRILEPTAL) 600 MG tablet Take 600 mg by mouth 3 (three) times daily.     polyethylene glycol (MIRALAX / GLYCOLAX) 17 g packet Take 17 g by mouth daily.     polyvinyl alcohol (LIQUIFILM TEARS) 1.4 % ophthalmic solution Place 1 drop into both eyes 4 (four) times daily as needed for dry eyes.     predniSONE (DELTASONE) 10 MG tablet Take 3 tablets (30mg ) daily for 3 days, then 2 tablets daily for 3 days, then 1 tablet daily for 3 days, then stop 18 tablet 0   PRESCRIPTION MEDICATION Inhale into the lungs See admin instructions. CPAP- At bedtime and during during any naps     Propylene Glycol (SYSTANE BALANCE) 0.6 % SOLN Place 1 drop into both eyes in the morning, at noon, and at bedtime.      sodium chloride (OCEAN) 0.65 % SOLN nasal spray Place 2 sprays into both nostrils 4 (four) times daily as needed for congestion.     tamsulosin (FLOMAX) 0.4 MG CAPS capsule Take 0.4 mg by mouth daily.     Tiotropium Bromide-Olodaterol  (STIOLTO RESPIMAT) 2.5-2.5 MCG/ACT AERS Inhale 2 puffs into the lungs daily.     traMADol (ULTRAM) 50 MG tablet Take 100 mg by mouth at bedtime.     traZODone (DESYREL) 50 MG tablet Take 75 mg by mouth at bedtime.     VITAMIN A PO Take 1 tablet by mouth daily.     vitamin C (ASCORBIC ACID) 500 MG tablet Take 500 mg by mouth 2 (two) times daily.      No facility-administered medications prior to visit.    ROS   Objective:  Physical Exam   There were no vitals filed for this visit.   on *** LPM *** RA BMI Readings from Last 3 Encounters:  07/26/20 44.01 kg/m  04/14/20 42.34 kg/m  04/04/20 42.34 kg/m   Wt Readings from Last 3 Encounters:  07/26/20 (!) 342 lb 12.8 oz (155.5 kg)  04/14/20 (!) 329 lb 12.9 oz (149.6 kg)  04/04/20 (!) 329 lb 12.9 oz (149.6 kg)     CBC    Component Value Date/Time   WBC 12.7 (H) 08/05/2020 0313   RBC 4.47 08/05/2020 0313   HGB 14.3 08/05/2020 0313   HCT 44.1 08/05/2020 0313   PLT 301 08/05/2020 0313   MCV 98.7 08/05/2020 0313   MCH 32.0 08/05/2020 0313   MCHC 32.4 08/05/2020 0313   RDW 13.3 08/05/2020 0313   LYMPHSABS 3.7 07/29/2020 0438   MONOABS 1.2 (H) 07/29/2020 0438   EOSABS 0.1 07/29/2020 0438   BASOSABS 0.1 07/29/2020 0438     Chest Imaging: 07/26/2020: CTA chest no PE, left lower lobe chronic airspace disease been present since 2018. The patient's images have been independently reviewed by me.  Pulmonary Functions Testing Results: No flowsheet data found.  FeNO:   Pathology:   FINAL MICROSCOPIC DIAGNOSIS:  A. LUNG, LLL, BRUSHING:  - Suspicious for malignancy  B. LUNG, LLL, FINE NEEDLE ASPIRATION:  - Malignant cells consistent with adenocarcinoma  Echocardiogram:   Heart Catheterization:     Assessment & Plan:     ICD-10-CM   1. NSCLC of left lung (HCC)  C34.92       Discussion:    Current Outpatient Medications:    acetaminophen (TYLENOL) 500 MG tablet, Take 1,000 mg by mouth every 6 (six) hours as  needed for moderate pain., Disp: , Rfl:    albuterol (PROVENTIL) (2.5 MG/3ML) 0.083% nebulizer solution, Inhale 3 mLs (2.5 mg total) by nebulization every 4 (four) hours as needed for wheezing or shortness of breath., Disp: 90 mL, Rfl: 2   albuterol (VENTOLIN HFA) 108 (90 Base) MCG/ACT inhaler, Inhale 2 puffs into the lungs every 6 (six) hours as needed for wheezing., Disp: , Rfl:    aspirin 81 MG EC tablet, Take 81 mg by mouth daily., Disp: , Rfl:    b complex vitamins capsule, Take 1 capsule by mouth daily., Disp: , Rfl:    bisacodyl (DULCOLAX) 5 MG EC tablet, Take 1 tablet (5 mg total) by mouth daily at 12 noon., Disp: 30 tablet, Rfl: 0   buPROPion (WELLBUTRIN XL) 300 MG 24 hr tablet, Take 300 mg by mouth daily., Disp: , Rfl:    busPIRone (BUSPAR) 15 MG tablet, Take 15 mg by mouth 2 (two) times daily., Disp: , Rfl:    calcium-vitamin D (OSCAL WITH D) 500-200 MG-UNIT tablet, Take 1 tablet by mouth 2 (two) times daily with a meal., Disp: , Rfl:    Cholecalciferol (VITAMIN D-3) 25 MCG (1000 UT) CAPS, Take 1,000 Units by mouth daily., Disp: , Rfl:    cyclobenzaprine (FLEXERIL) 10 MG tablet, Take 20 mg by mouth at bedtime., Disp: , Rfl:    diltiazem (CARDIZEM CD) 180 MG 24 hr capsule, Take 180 mg by mouth daily., Disp: , Rfl:    docusate sodium (COLACE) 100 MG capsule, Take 2 capsules (200 mg total) by mouth 2 (two) times daily., Disp: 10 capsule, Rfl: 0   DULoxetine (CYMBALTA) 60 MG capsule, Take 1 capsule (60 mg total) by mouth 2 (two) times daily. (Patient taking differently: Take 60 mg by mouth 2 (two) times daily with a meal.), Disp: 60 capsule, Rfl: 0   etanercept (ENBREL) 50 MG/ML injection, Inject 50 mg into the skin every Wednesday. (Patient not taking: Reported on 07/26/2020), Disp: , Rfl:    feeding supplement (ENSURE ENLIVE / ENSURE PLUS) LIQD, Take 237 mLs by mouth 3 (three) times daily between meals., Disp: 10000 mL, Rfl: 0   fluticasone (FLONASE) 50 MCG/ACT nasal spray, Place 2 sprays  into both nostrils daily. , Disp: , Rfl:    folic acid (FOLVITE) 1 MG tablet, Take 1 mg by mouth daily., Disp: , Rfl:    gabapentin (NEURONTIN) 300 MG capsule, Take 900 mg by mouth at bedtime., Disp: , Rfl:    Garlic Oil 3 MG CAPS, Take 3 mg by mouth daily., Disp: , Rfl:    lisinopril (ZESTRIL) 20 MG tablet, Take 20 mg by mouth daily., Disp: , Rfl:    lurasidone (LATUDA) 40 MG TABS tablet, Take 40 mg by mouth daily after supper., Disp: , Rfl:    Melatonin 3 MG TABS, Take 6 mg by mouth at bedtime., Disp: , Rfl:    metFORMIN (  GLUCOPHAGE) 500 MG tablet, Take 500 mg by mouth 2 (two) times daily with a meal., Disp: , Rfl:    methotrexate (RHEUMATREX) 2.5 MG tablet, Take 6 tablets (15 mg total) by mouth every Saturday. Hold until seen by rheumatology  Caution:Chemotherapy. Protect from light. (Patient taking differently: Take 15 mg by mouth every Saturday.), Disp: 4 tablet, Rfl: 0   Multiple Vitamin (MULTIVITAMIN WITH MINERALS) TABS tablet, Take 1 tablet by mouth daily., Disp: , Rfl:    mycophenolate (CELLCEPT) 250 MG capsule, Take 1,500 mg by mouth 2 (two) times daily., Disp: , Rfl:    nitroGLYCERIN (NITROSTAT) 0.4 MG SL tablet, Place 1 tablet (0.4 mg total) under the tongue every 5 (five) minutes as needed for chest pain., Disp: 12 tablet, Rfl: 12   Olodaterol HCl (STRIVERDI RESPIMAT) 2.5 MCG/ACT AERS, Inhale 2 puffs into the lungs daily., Disp: , Rfl:    OVER THE COUNTER MEDICATION, Place 1 drop into both ears daily as needed (itching). Miracell ProEar for irritated itchy ears, Disp: , Rfl:    OVER THE COUNTER MEDICATION, Take 2 tablets by mouth at bedtime. Pure Zzzz, Disp: , Rfl:    oxcarbazepine (TRILEPTAL) 600 MG tablet, Take 600 mg by mouth 3 (three) times daily., Disp: , Rfl:    polyethylene glycol (MIRALAX / GLYCOLAX) 17 g packet, Take 17 g by mouth daily., Disp: , Rfl:    polyvinyl alcohol (LIQUIFILM TEARS) 1.4 % ophthalmic solution, Place 1 drop into both eyes 4 (four) times daily as needed for  dry eyes., Disp: , Rfl:    predniSONE (DELTASONE) 10 MG tablet, Take 3 tablets (30mg ) daily for 3 days, then 2 tablets daily for 3 days, then 1 tablet daily for 3 days, then stop, Disp: 18 tablet, Rfl: 0   PRESCRIPTION MEDICATION, Inhale into the lungs See admin instructions. CPAP- At bedtime and during during any naps, Disp: , Rfl:    Propylene Glycol (SYSTANE BALANCE) 0.6 % SOLN, Place 1 drop into both eyes in the morning, at noon, and at bedtime. , Disp: , Rfl:    sodium chloride (OCEAN) 0.65 % SOLN nasal spray, Place 2 sprays into both nostrils 4 (four) times daily as needed for congestion., Disp: , Rfl:    tamsulosin (FLOMAX) 0.4 MG CAPS capsule, Take 0.4 mg by mouth daily., Disp: , Rfl:    Tiotropium Bromide-Olodaterol (STIOLTO RESPIMAT) 2.5-2.5 MCG/ACT AERS, Inhale 2 puffs into the lungs daily., Disp: , Rfl:    traMADol (ULTRAM) 50 MG tablet, Take 100 mg by mouth at bedtime., Disp: , Rfl:    traZODone (DESYREL) 50 MG tablet, Take 75 mg by mouth at bedtime., Disp: , Rfl:    VITAMIN A PO, Take 1 tablet by mouth daily., Disp: , Rfl:    vitamin C (ASCORBIC ACID) 500 MG tablet, Take 500 mg by mouth 2 (two) times daily. , Disp: , Rfl:   I spent *** minutes dedicated to the care of this patient on the date of this encounter to include pre-visit review of records, face-to-face time with the patient discussing conditions above, post visit ordering of testing, clinical documentation with the electronic health record, making appropriate referrals as documented, and communicating necessary findings to members of the patients care team.   Garner Nash, [facility] 08/13/2020 10:00 AM

## 2020-08-14 ENCOUNTER — Other Ambulatory Visit (HOSPITAL_COMMUNITY): Payer: Self-pay

## 2020-08-28 LAB — FUNGUS CULTURE WITH STAIN

## 2020-08-28 LAB — FUNGUS CULTURE RESULT

## 2020-08-28 LAB — FUNGAL ORGANISM REFLEX

## 2020-09-02 ENCOUNTER — Other Ambulatory Visit (HOSPITAL_COMMUNITY): Payer: Self-pay | Admitting: Internal Medicine

## 2020-09-02 ENCOUNTER — Other Ambulatory Visit: Payer: Self-pay | Admitting: Internal Medicine

## 2020-09-02 DIAGNOSIS — C3432 Malignant neoplasm of lower lobe, left bronchus or lung: Secondary | ICD-10-CM

## 2020-09-03 ENCOUNTER — Emergency Department (HOSPITAL_COMMUNITY): Payer: No Typology Code available for payment source

## 2020-09-03 ENCOUNTER — Inpatient Hospital Stay (HOSPITAL_COMMUNITY)
Admission: EM | Admit: 2020-09-03 | Discharge: 2020-09-05 | DRG: 191 | Disposition: A | Discharge status: Home / Self Care | Payer: No Typology Code available for payment source | Attending: Family Medicine | Admitting: Family Medicine

## 2020-09-03 ENCOUNTER — Other Ambulatory Visit: Payer: Self-pay

## 2020-09-03 DIAGNOSIS — Z7984 Long term (current) use of oral hypoglycemic drugs: Secondary | ICD-10-CM

## 2020-09-03 DIAGNOSIS — J441 Chronic obstructive pulmonary disease with (acute) exacerbation: Principal | ICD-10-CM | POA: Diagnosis present

## 2020-09-03 DIAGNOSIS — J9611 Chronic respiratory failure with hypoxia: Secondary | ICD-10-CM | POA: Diagnosis present

## 2020-09-03 DIAGNOSIS — Z87891 Personal history of nicotine dependence: Secondary | ICD-10-CM

## 2020-09-03 DIAGNOSIS — Z7951 Long term (current) use of inhaled steroids: Secondary | ICD-10-CM

## 2020-09-03 DIAGNOSIS — F419 Anxiety disorder, unspecified: Secondary | ICD-10-CM | POA: Diagnosis present

## 2020-09-03 DIAGNOSIS — E1165 Type 2 diabetes mellitus with hyperglycemia: Secondary | ICD-10-CM

## 2020-09-03 DIAGNOSIS — I10 Essential (primary) hypertension: Secondary | ICD-10-CM | POA: Diagnosis present

## 2020-09-03 DIAGNOSIS — I252 Old myocardial infarction: Secondary | ICD-10-CM

## 2020-09-03 DIAGNOSIS — Z6841 Body Mass Index (BMI) 40.0 and over, adult: Secondary | ICD-10-CM

## 2020-09-03 DIAGNOSIS — Z7982 Long term (current) use of aspirin: Secondary | ICD-10-CM

## 2020-09-03 DIAGNOSIS — C3432 Malignant neoplasm of lower lobe, left bronchus or lung: Secondary | ICD-10-CM | POA: Diagnosis present

## 2020-09-03 DIAGNOSIS — Z79899 Other long term (current) drug therapy: Secondary | ICD-10-CM

## 2020-09-03 DIAGNOSIS — E119 Type 2 diabetes mellitus without complications: Secondary | ICD-10-CM | POA: Diagnosis present

## 2020-09-03 DIAGNOSIS — G4733 Obstructive sleep apnea (adult) (pediatric): Secondary | ICD-10-CM | POA: Diagnosis present

## 2020-09-03 DIAGNOSIS — Z20822 Contact with and (suspected) exposure to covid-19: Secondary | ICD-10-CM | POA: Diagnosis present

## 2020-09-03 DIAGNOSIS — F32A Depression, unspecified: Secondary | ICD-10-CM | POA: Diagnosis present

## 2020-09-03 LAB — COMPREHENSIVE METABOLIC PANEL
ALT: 38 U/L (ref 0–44)
AST: 27 U/L (ref 15–41)
Albumin: 4.2 g/dL (ref 3.5–5.0)
Alkaline Phosphatase: 53 U/L (ref 38–126)
Anion gap: 11 (ref 5–15)
BUN: 17 mg/dL (ref 8–23)
CO2: 27 mmol/L (ref 22–32)
Calcium: 9.5 mg/dL (ref 8.9–10.3)
Chloride: 101 mmol/L (ref 98–111)
Creatinine, Ser: 1.33 mg/dL — ABNORMAL HIGH (ref 0.61–1.24)
GFR, Estimated: 58 mL/min — ABNORMAL LOW (ref >60)
Glucose, Bld: 124 mg/dL — ABNORMAL HIGH (ref 70–99)
Potassium: 4.3 mmol/L (ref 3.5–5.1)
Sodium: 139 mmol/L (ref 135–145)
Total Bilirubin: 0.4 mg/dL (ref 0.3–1.2)
Total Protein: 7.3 g/dL (ref 6.5–8.1)

## 2020-09-03 LAB — CBC WITH DIFFERENTIAL/PLATELET
Abs Immature Granulocytes: 0.03 10*3/uL (ref 0.00–0.07)
Basophils Absolute: 0.1 10*3/uL (ref 0.0–0.1)
Basophils Relative: 1 %
Eosinophils Absolute: 0.2 10*3/uL (ref 0.0–0.5)
Eosinophils Relative: 2 %
HCT: 45.8 % (ref 39.0–52.0)
Hemoglobin: 15.3 g/dL (ref 13.0–17.0)
Immature Granulocytes: 0 %
Lymphocytes Relative: 38 %
Lymphs Abs: 4.3 10*3/uL — ABNORMAL HIGH (ref 0.7–4.0)
MCH: 32.7 pg (ref 26.0–34.0)
MCHC: 33.4 g/dL (ref 30.0–36.0)
MCV: 97.9 fL (ref 80.0–100.0)
Monocytes Absolute: 1.1 10*3/uL — ABNORMAL HIGH (ref 0.1–1.0)
Monocytes Relative: 10 %
Neutro Abs: 5.7 10*3/uL (ref 1.7–7.7)
Neutrophils Relative %: 49 %
Platelets: 278 10*3/uL (ref 150–400)
RBC: 4.68 MIL/uL (ref 4.22–5.81)
RDW: 13.3 % (ref 11.5–15.5)
WBC: 11.3 10*3/uL — ABNORMAL HIGH (ref 4.0–10.5)
nRBC: 0 % (ref 0.0–0.2)

## 2020-09-03 LAB — BRAIN NATRIURETIC PEPTIDE: B Natriuretic Peptide: 10.3 pg/mL (ref 0.0–100.0)

## 2020-09-03 MED ORDER — IOHEXOL 350 MG/ML SOLN
80.0000 mL | Freq: Once | INTRAVENOUS | Status: AC | PRN
  Administered 2020-09-03: 80 mL via INTRAVENOUS

## 2020-09-03 MED ORDER — ALBUTEROL SULFATE (2.5 MG/3ML) 0.083% IN NEBU
5.0000 mg | INHALATION_SOLUTION | Freq: Once | RESPIRATORY_TRACT | Status: AC
  Administered 2020-09-04: 5 mg via RESPIRATORY_TRACT
  Filled 2020-09-03: qty 6

## 2020-09-03 MED ORDER — SODIUM CHLORIDE 0.9 % IV SOLN
1.0000 g | Freq: Once | INTRAVENOUS | Status: AC
  Administered 2020-09-04: 1 g via INTRAVENOUS
  Filled 2020-09-03: qty 10

## 2020-09-03 MED ORDER — SODIUM CHLORIDE 0.9 % IV SOLN
500.0000 mg | Freq: Once | INTRAVENOUS | Status: AC
  Administered 2020-09-04: 500 mg via INTRAVENOUS
  Filled 2020-09-03: qty 500

## 2020-09-03 MED ORDER — PREDNISONE 20 MG PO TABS
60.0000 mg | ORAL_TABLET | ORAL | Status: AC
  Administered 2020-09-04: 60 mg via ORAL
  Filled 2020-09-03: qty 3

## 2020-09-03 NOTE — ED Notes (Signed)
Patient made aware we need a urine sample. He said he will need more water before he can provide that.

## 2020-09-03 NOTE — ED Triage Notes (Signed)
Pt arrived from home via EMS for [facility] with hx of COPD.  Pt recently dx with lung cancer, has not started treatments yet.  Pt wears 3L 02 at night, when EMS arrived pt's 02 sats 86% on ra.  Pt states he became short of breath today and the shortness of breath worsened when he changed the filter in his hvac system.

## 2020-09-03 NOTE — ED Provider Notes (Signed)
[facility] DEPT Provider Note   CSN: [id number] Arrival date & time: 09/03/20  2108     History Chief Complaint  Patient presents with   Shortness of Breath    Mario [name] is a 70 y.o. male.  HPI Patient presents via EMS for concern of dyspnea. He has a history of COPD, does not wear oxygen at home aside from nocturnally. Patient also has a notable recent diagnosis of lung cancer, has not yet started therapy. Per EMS the patient's saturation was 86% on room air, this improved with nonrebreather mask in route.  Patient notes that he became dyspneic while performing ADL today.  No pain, no fever, no syncope, no fall.  No medication taken for relief, though he does feel better with supplemental oxygen.    Past Medical History:  Diagnosis Date   Anxiety    Bronchitis    COPD (chronic obstructive pulmonary disease) ([place])    Depression    Hypertension    Mental disorder    MI (myocardial infarction) ([place])    ????   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   Stage 3a chronic kidney disease ([place]) 12/26/2019   Abnormal CT of the chest 12/26/2019   COPD (chronic obstructive pulmonary disease) ([place])    AKI (acute kidney injury) ([place])    Hyperglycemia 06/23/2019   OSA (obstructive sleep apnea) 06/23/2019   Suicidal ideation 06/23/2019   Major depressive disorder, recurrent episode ([place]) 10/12/2018   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   Cellulitis of both feet 07/16/2017   DM2 (diabetes mellitus, type 2) ([place])  07/16/2017   HLA B27 (HLA B27 positive) 07/16/2017   Acute chest pain    Hyperkalemia    Spontaneous pneumothorax 06/27/2016   Hypoxia    Pneumothorax on left    Dyspnea 05/23/2014   COPD exacerbation ([place]) 05/23/2014   Malingering 01/21/2013   Depression 01/11/2013   Tobacco abuse 01/11/2013   Obesity, unspecified 01/11/2013   Chest pain 01/10/2013   HTN (hypertension) 01/10/2013    Past Surgical History:  Procedure Laterality Date   BRONCHIAL BIOPSY  07/30/2020   Procedure: BRONCHIAL BIOPSIES;  Surgeon: Garner Nash, DO;  Location: [facility];  Service: Pulmonary;;   BRONCHIAL BRUSHINGS  07/30/2020   Procedure: BRONCHIAL BRUSHINGS;  Surgeon: Garner Nash, DO;  Location: [facility];  Service: Pulmonary;;   BRONCHIAL NEEDLE ASPIRATION BIOPSY  07/30/2020   Procedure: BRONCHIAL NEEDLE ASPIRATION BIOPSIES;  Surgeon: Garner Nash, DO;  Location: [facility];  Service: Pulmonary;;   BRONCHIAL WASHINGS  07/30/2020   Procedure: BRONCHIAL WASHINGS;  Surgeon: Garner Nash, DO;  Location: [facility];  Service: Pulmonary;;   CHEST TUBE INSERTION Left 06/27/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     SKIN CANCER EXCISION     VIDEO BRONCHOSCOPY WITH ENDOBRONCHIAL NAVIGATION Left 07/30/2020   Procedure: VIDEO BRONCHOSCOPY WITH ENDOBRONCHIAL NAVIGATION;  Surgeon: Garner Nash, DO;  Location: [facility];  Service: Pulmonary;  Laterality: Left;       Family History  Problem Relation Age of Onset   Dementia Father     Social History   Tobacco Use   Smoking status: Former    Packs/day: 1.00    Years: 35.00    Pack years: 35.00    Types: Cigarettes    Quit date: 05/2016    Years since quitting: 4.2   Smokeless tobacco: Never  Vaping Use   Vaping Use: Never used  Substance Use Topics   Alcohol use: No    Alcohol/week: 0.0 standard drinks    Comment: denies use of any drugs or alcohol   Drug use: No    Home Medications Prior to Admission medications   Medication Sig Start  Date End Date Taking? Authorizing Provider  acetaminophen (TYLENOL) 500 MG tablet Take 1,000 mg by mouth every 6 (six) hours as needed for moderate pain.    [provider]  albuterol (PROVENTIL) (2.5 MG/3ML) 0.083% nebulizer solution Inhale 3 mLs (2.5 mg total) by nebulization every 4 (four) hours as needed for wheezing or shortness of breath. 08/04/20 08/04/21  Lavina Hamman, MD  albuterol (VENTOLIN HFA) 108 (90 Base) MCG/ACT inhaler Inhale 2 puffs into the lungs every 6 (six) hours as needed for wheezing. 06/02/20   [provider]  aspirin 81 MG EC tablet Take 81 mg by mouth daily.    [provider]  b complex vitamins capsule Take 1 capsule by mouth daily.    [provider]  bisacodyl (DULCOLAX) 5 MG EC tablet Take 1 tablet (5 mg total) by mouth daily at 12 noon. 08/04/20   Lavina Hamman, MD  buPROPion (WELLBUTRIN XL) 300 MG 24 hr tablet Take 300 mg by mouth daily. 02/05/20   [provider]  busPIRone (BUSPAR) 15 MG tablet Take 15 mg by mouth 2 (two) times daily.    [provider]  calcium-vitamin D (OSCAL WITH D) 500-200 MG-UNIT tablet Take 1 tablet by mouth 2 (two) times daily with a meal.    [provider]  Cholecalciferol (VITAMIN D-3) 25 MCG (1000 UT) CAPS Take 1,000 Units by mouth daily.    [provider]  cyclobenzaprine (FLEXERIL) 10 MG tablet Take 20 mg by mouth at bedtime.    [provider]  diltiazem (CARDIZEM CD) 180 MG 24 hr capsule Take 180 mg by mouth daily.    [provider]  docusate sodium (COLACE) 100 MG capsule Take 2 capsules (200 mg total) by mouth 2 (two) times daily. 08/04/20   Lavina Hamman, MD  DULoxetine (CYMBALTA) 60 MG capsule Take 1 capsule (60 mg total) by mouth 2 (two) times daily. Patient taking differently: Take 60 mg by mouth 2 (two) times daily with a meal. 07/21/17   Lavina Hamman, MD  etanercept (ENBREL) 50 MG/ML injection Inject 50 mg into the skin every  Wednesday. Patient not taking: Reported on 07/26/2020    [provider]  feeding supplement (ENSURE ENLIVE / ENSURE PLUS) LIQD Take 237 mLs by mouth 3 (three) times daily between meals. 08/04/20   Lavina Hamman, MD  fluticasone Asencion Islam) 50 MCG/ACT nasal spray Place 2 sprays into both nostrils daily.     [provider]  folic acid (FOLVITE) 1 MG tablet Take 1 mg by mouth daily.    [provider]  gabapentin (NEURONTIN) 300 MG capsule Take 900 mg by mouth at bedtime.    [provider]  Garlic Oil 3 MG CAPS Take 3 mg by mouth daily.  [provider]  lisinopril (ZESTRIL) 20 MG tablet Take 20 mg by mouth daily.    [provider]  lurasidone (LATUDA) 40 MG TABS tablet Take 40 mg by mouth daily after supper.    [provider]  Melatonin 3 MG TABS Take 6 mg by mouth at bedtime.    [provider]  metFORMIN (GLUCOPHAGE) 500 MG tablet Take 500 mg by mouth 2 (two) times daily with a meal.    [provider]  methotrexate (RHEUMATREX) 2.5 MG tablet Take 6 tablets (15 mg total) by mouth every Saturday. Hold until seen by rheumatology  Caution:Chemotherapy. Protect from light. Patient taking differently: Take 15 mg by mouth every Saturday. 07/22/17   Lavina Hamman, MD  Multiple Vitamin (MULTIVITAMIN WITH MINERALS) TABS tablet Take 1 tablet by mouth daily.    [provider]  mycophenolate (CELLCEPT) 250 MG capsule Take 1,500 mg by mouth 2 (two) times daily.    [provider]  nitroGLYCERIN (NITROSTAT) 0.4 MG SL tablet Place 1 tablet (0.4 mg total) under the tongue every 5 (five) minutes as needed for chest pain. 07/03/19   Danford, Suann Larry, MD  Olodaterol HCl (STRIVERDI RESPIMAT) 2.5 MCG/ACT AERS Inhale 2 puffs into the lungs daily.    [provider]  OVER THE COUNTER MEDICATION Place 1 drop into both ears daily as needed (itching). Miracell ProEar for irritated itchy ears    [provider]  OVER THE COUNTER MEDICATION Take 2 tablets by mouth at bedtime. Pure Zzzz    [provider]  oxcarbazepine (TRILEPTAL) 600 MG tablet Take 600 mg by mouth 3 (three) times daily. 07/16/20   [provider]  polyethylene glycol (MIRALAX / GLYCOLAX) 17 g packet Take 17 g by mouth daily.    [provider]  polyvinyl alcohol (LIQUIFILM TEARS) 1.4 % ophthalmic solution Place 1 drop into both eyes 4 (four) times daily as needed for dry eyes.    [provider]  predniSONE (DELTASONE) 10 MG tablet Take 3 tablets (94m) daily for 3 days, then 2 tablets daily for 3 days, then 1 tablet daily for 3 days, then stop 08/04/20   PLavina Hamman MD  PRESCRIPTION MEDICATION Inhale into the lungs See admin instructions. CPAP- At bedtime and during during any naps    [provider]  Propylene Glycol (SYSTANE BALANCE) 0.6 % SOLN Place 1 drop into both eyes in the morning, at noon, and at bedtime.     [provider]  sodium chloride (OCEAN) 0.65 % SOLN nasal spray Place 2 sprays into both nostrils 4 (four) times daily as needed for congestion.    [provider]  tamsulosin (FLOMAX) 0.4 MG CAPS capsule Take 0.4 mg by mouth daily.    [provider]  Tiotropium Bromide-Olodaterol (STIOLTO RESPIMAT) 2.5-2.5 MCG/ACT AERS Inhale 2 puffs into the lungs daily.    [provider]  traMADol (ULTRAM) 50 MG tablet Take 100 mg by mouth at bedtime.    [provider]  traZODone (DESYREL) 50 MG tablet Take 75 mg by mouth at bedtime.    [provider]  VITAMIN A PO Take 1 tablet by mouth daily.    [provider]  vitamin C (ASCORBIC ACID) 500 MG tablet Take 500 mg by mouth 2 (two) times daily.     [provider]    Allergies    Demerol [meperidine], Zocor [simvastatin], Beet [beta vulgaris], Codeine, and Liver  Review of Systems  Review of Systems  Constitutional:        Per HPI, otherwise  negative  HENT:         Per HPI, otherwise negative  Respiratory:         Per HPI, otherwise negative  Cardiovascular:        Per HPI, otherwise negative  Gastrointestinal:  Negative for vomiting.  Endocrine:       Negative aside from HPI  Genitourinary:        Neg aside from HPI   Musculoskeletal:        Per HPI, otherwise negative  Skin: Negative.   Neurological:  Negative for syncope.   Physical Exam Updated Vital Signs BP 120/85 (BP Location: Right Arm)   Pulse 93   Temp (!) 97.4 F (36.3 C) (Axillary)   Resp 17   SpO2 93%   Physical Exam Vitals and nursing note reviewed.  Constitutional:      Appearance: He is obese. He is ill-appearing and diaphoretic.  HENT:     Head: Normocephalic and atraumatic.  Eyes:     Conjunctiva/sclera: Conjunctivae normal.  Cardiovascular:     Rate and Rhythm: Regular rhythm. Tachycardia present.  Pulmonary:     Effort: Tachypnea present.     Breath sounds: Decreased breath sounds present.  Abdominal:     General: There is no distension.  Skin:    General: Skin is warm.  Neurological:     Mental Status: He is alert and oriented to person, place, and time.    ED Results / Procedures / Treatments   Labs (all labs ordered are listed, but only abnormal results are displayed) Labs Reviewed  COMPREHENSIVE METABOLIC PANEL - Abnormal; Notable for the following components:      Result Value   Glucose, Bld 124 (*)    Creatinine, Ser 1.33 (*)    GFR, Estimated 58 (*)    All other components within normal limits  CBC WITH DIFFERENTIAL/PLATELET - Abnormal; Notable for the following components:   WBC 11.3 (*)    Lymphs Abs 4.3 (*)    Monocytes Absolute 1.1 (*)    All other components within normal limits  BRAIN NATRIURETIC PEPTIDE  URINALYSIS, ROUTINE W REFLEX MICROSCOPIC    EKG None  Radiology CT Angio Chest PE W/Cm &/Or Wo Cm  Result Date: 09/03/2020 CLINICAL DATA:  Shortness of breath, lung cancer, hypoxia. EXAM: CT  ANGIOGRAPHY CHEST WITH CONTRAST TECHNIQUE: Multidetector CT imaging of the chest was performed using the standard protocol during bolus administration of intravenous contrast. Multiplanar CT image reconstructions and MIPs were obtained to evaluate the vascular anatomy. CONTRAST:  34m OMNIPAQUE IOHEXOL 350 MG/ML SOLN COMPARISON:  Chest radiograph dated 09/03/2020. CT chest dated 07/26/2020. FINDINGS: Cardiovascular: Satisfactory opacification the bilateral pulmonary arteries to the lobar level. Evaluation is also constrained due to respiratory motion. Within that constraint, there is no evidence of pulmonary embolism. Although not tailored for evaluation of the thoracic aorta, there is no evidence of thoracic aortic aneurysm or dissection. Atherosclerotic calcifications descending thoracic aorta. The heart is normal in size.  No pericardial effusion. Coronary atherosclerosis of the LAD and right coronary artery. Mediastinum/Nodes: No suspicious mediastinal lymphadenopathy. Visualized thyroid is unremarkable. Lungs/Pleura: Moderate centrilobular and paraseptal emphysematous changes, upper lung predominant. 4.1 x 3.0 cm left lower lobe nodule (series 7/image 114), poorly visualized due to motion degradation, corresponding to the patient's known primary bronchogenic neoplasm. Small left pleural effusion. No pneumothorax. Upper Abdomen: Visualized upper abdomen is grossly unremarkable. Musculoskeletal: Degenerative  changes of the visualized thoracolumbar spine. Review of the MIP images confirms the above findings. IMPRESSION: No evidence of pulmonary embolism. 4.1 cm left lower lobe nodule, corresponding to the patient's known primary bronchogenic neoplasm, poorly visualized due to motion degradation. Small left pleural effusion. Aortic Atherosclerosis (ICD10-I70.0) and Emphysema (ICD10-J43.9). Electronically Signed   By: Julian Hy M.D.   On: 09/03/2020 22:56   DG Chest Port 1 View  Result Date:  09/03/2020 CLINICAL DATA:  Shortness of breath recent diagnosis of lung cancer EXAM: PORTABLE CHEST 1 VIEW COMPARISON:  07/30/2020, chest CT 07/26/2020 FINDINGS: Emphysema with bronchitic changes. Ill-defined left lower lobe opacity likely corresponds to patient's CT lung mass. Scarring or atelectasis at the bases. Stable cardiomediastinal silhouette. IMPRESSION: 1. Emphysema with scarring or atelectasis at the bases 2. Ill-defined left lung base opacity likely corresponds to the CT demonstrated lung mass Electronically Signed   By: Donavan Foil M.D.   On: 09/03/2020 21:36    Procedures Procedures   Medications Ordered in ED Medications  predniSONE (DELTASONE) tablet 60 mg (has no administration in time range)  albuterol (PROVENTIL) (2.5 MG/3ML) 0.083% nebulizer solution 5 mg (has no administration in time range)  azithromycin (ZITHROMAX) 500 mg in sodium chloride 0.9 % 250 mL IVPB (has no administration in time range)  cefTRIAXone (ROCEPHIN) 1 g in sodium chloride 0.9 % 100 mL IVPB (has no administration in time range)  iohexol (OMNIPAQUE) 350 MG/ML injection 80 mL (80 mLs Intravenous Contrast Given 09/03/20 2239)    ED Course  I have reviewed the triage vital signs and the nursing notes.  Pertinent labs & imaging results that were available during my care of the patient were reviewed by me and considered in my medical decision making (see chart for details).  Cardiac 80s sinus normal Pulse ox 94% with 3 L via nasal cannula abnormal  Adult male with recent cancer diagnosis, known history of COPD presents with new dyspnea.  Broad differential including pneumonia, COPD exacerbation, malignancy progression, PE all considered.  CT ordered, labs ordered, supplemental oxygen continued, bronchodilators provided.  11:44 PM Patient continues to feel weak, dyspneic.  I reviewed his CT, discussed it with him.  No evidence for pulmonary embolism. Patient's lung mass is demonstrated again. He and I  discussed the implications of his findings, and given his new oxygen requirement with 4 L via nasal cannula to achieve saturation of 95% patient will require admission.  Steroids, antibiotics, bronchodilators all ordered. MDM Rules/Calculators/A&P MDM Number of Diagnoses or Management Options COPD exacerbation ([place]): new, needed workup   Amount and/or Complexity of Data Reviewed Clinical lab tests: ordered and reviewed Tests in the radiology section of CPT: ordered and reviewed Tests in the medicine section of CPT: reviewed and ordered Decide to obtain previous medical records or to obtain history from someone other than the patient: yes Obtain history from someone other than the patient: yes Review and summarize past medical records: yes Discuss the patient with other providers: yes Independent visualization of images, tracings, or specimens: yes  Risk of Complications, Morbidity, and/or Mortality Presenting problems: high Diagnostic procedures: high Management options: high  Critical Care Total time providing critical care: < 30 minutes  Patient Progress Patient progress: stable   Final Clinical Impression(s) / ED Diagnoses Final diagnoses:  COPD exacerbation ([place])     Carmin Muskrat, MD 09/03/20 2346

## 2020-09-03 NOTE — ED Notes (Signed)
Patient still has not urinated.

## 2020-09-04 ENCOUNTER — Encounter (HOSPITAL_COMMUNITY): Payer: Self-pay | Admitting: Internal Medicine

## 2020-09-04 DIAGNOSIS — Z20822 Contact with and (suspected) exposure to covid-19: Secondary | ICD-10-CM | POA: Diagnosis present

## 2020-09-04 DIAGNOSIS — E119 Type 2 diabetes mellitus without complications: Secondary | ICD-10-CM | POA: Diagnosis present

## 2020-09-04 DIAGNOSIS — G4733 Obstructive sleep apnea (adult) (pediatric): Secondary | ICD-10-CM | POA: Diagnosis present

## 2020-09-04 DIAGNOSIS — Z7984 Long term (current) use of oral hypoglycemic drugs: Secondary | ICD-10-CM | POA: Diagnosis not present

## 2020-09-04 DIAGNOSIS — I10 Essential (primary) hypertension: Secondary | ICD-10-CM | POA: Diagnosis present

## 2020-09-04 DIAGNOSIS — F32A Depression, unspecified: Secondary | ICD-10-CM | POA: Diagnosis present

## 2020-09-04 DIAGNOSIS — Z87891 Personal history of nicotine dependence: Secondary | ICD-10-CM | POA: Diagnosis not present

## 2020-09-04 DIAGNOSIS — Z79899 Other long term (current) drug therapy: Secondary | ICD-10-CM | POA: Diagnosis not present

## 2020-09-04 DIAGNOSIS — J9611 Chronic respiratory failure with hypoxia: Secondary | ICD-10-CM | POA: Diagnosis present

## 2020-09-04 DIAGNOSIS — Z6841 Body Mass Index (BMI) 40.0 and over, adult: Secondary | ICD-10-CM | POA: Diagnosis not present

## 2020-09-04 DIAGNOSIS — C3432 Malignant neoplasm of lower lobe, left bronchus or lung: Secondary | ICD-10-CM | POA: Diagnosis present

## 2020-09-04 DIAGNOSIS — Z7982 Long term (current) use of aspirin: Secondary | ICD-10-CM | POA: Diagnosis not present

## 2020-09-04 DIAGNOSIS — F419 Anxiety disorder, unspecified: Secondary | ICD-10-CM | POA: Diagnosis present

## 2020-09-04 DIAGNOSIS — Z7951 Long term (current) use of inhaled steroids: Secondary | ICD-10-CM | POA: Diagnosis not present

## 2020-09-04 DIAGNOSIS — I252 Old myocardial infarction: Secondary | ICD-10-CM | POA: Diagnosis not present

## 2020-09-04 DIAGNOSIS — J441 Chronic obstructive pulmonary disease with (acute) exacerbation: Secondary | ICD-10-CM | POA: Diagnosis present

## 2020-09-04 LAB — URINALYSIS, ROUTINE W REFLEX MICROSCOPIC
Bilirubin Urine: NEGATIVE
Glucose, UA: NEGATIVE mg/dL
Hgb urine dipstick: NEGATIVE
Ketones, ur: NEGATIVE mg/dL
Leukocytes,Ua: NEGATIVE
Nitrite: NEGATIVE
Protein, ur: NEGATIVE mg/dL
Specific Gravity, Urine: 1.042 — ABNORMAL HIGH (ref 1.005–1.030)
pH: 7 (ref 5.0–8.0)

## 2020-09-04 LAB — CBC
HCT: 44.6 % (ref 39.0–52.0)
Hemoglobin: 14.8 g/dL (ref 13.0–17.0)
MCH: 32.9 pg (ref 26.0–34.0)
MCHC: 33.2 g/dL (ref 30.0–36.0)
MCV: 99.1 fL (ref 80.0–100.0)
Platelets: 244 10*3/uL (ref 150–400)
RBC: 4.5 MIL/uL (ref 4.22–5.81)
RDW: 13.2 % (ref 11.5–15.5)
WBC: 9 10*3/uL (ref 4.0–10.5)
nRBC: 0 % (ref 0.0–0.2)

## 2020-09-04 LAB — CBG MONITORING, ED: Glucose-Capillary: 204 mg/dL — ABNORMAL HIGH (ref 70–99)

## 2020-09-04 LAB — GLUCOSE, CAPILLARY
Glucose-Capillary: 207 mg/dL — ABNORMAL HIGH (ref 70–99)
Glucose-Capillary: 196 mg/dL — ABNORMAL HIGH (ref 70–99)

## 2020-09-04 LAB — CREATININE, SERUM
Creatinine, Ser: 1.03 mg/dL (ref 0.61–1.24)
GFR, Estimated: >60 mL/min (ref >60)

## 2020-09-04 LAB — PROCALCITONIN: Procalcitonin: <0.1 ng/mL

## 2020-09-04 LAB — SARS CORONAVIRUS 2 (TAT 6-24 HRS): SARS Coronavirus 2: NEGATIVE

## 2020-09-04 MED ORDER — POLYETHYLENE GLYCOL 3350 17 G PO PACK
17.0000 g | PACK | Freq: Every day | ORAL | Status: DC
  Administered 2020-09-04 – 2020-09-05 (×2): 17 g via ORAL
  Filled 2020-09-04 (×2): qty 1

## 2020-09-04 MED ORDER — BUSPIRONE HCL 5 MG PO TABS
15.0000 mg | ORAL_TABLET | Freq: Two times a day (BID) | ORAL | Status: DC
  Administered 2020-09-04 – 2020-09-05 (×3): 15 mg via ORAL
  Filled 2020-09-04 (×2): qty 1
  Filled 2020-09-04: qty 2

## 2020-09-04 MED ORDER — DOCUSATE SODIUM 100 MG PO CAPS
200.0000 mg | ORAL_CAPSULE | Freq: Two times a day (BID) | ORAL | Status: DC
  Administered 2020-09-04 – 2020-09-05 (×3): 200 mg via ORAL
  Filled 2020-09-04 (×3): qty 2

## 2020-09-04 MED ORDER — BISACODYL 5 MG PO TBEC
5.0000 mg | DELAYED_RELEASE_TABLET | Freq: Every day | ORAL | Status: DC
  Administered 2020-09-04: 5 mg via ORAL
  Filled 2020-09-04: qty 1

## 2020-09-04 MED ORDER — BUPROPION HCL ER (XL) 300 MG PO TB24
300.0000 mg | ORAL_TABLET | Freq: Every day | ORAL | Status: DC
  Administered 2020-09-04 – 2020-09-05 (×2): 300 mg via ORAL
  Filled 2020-09-04: qty 2
  Filled 2020-09-04: qty 1

## 2020-09-04 MED ORDER — ONDANSETRON HCL 4 MG/2ML IJ SOLN: 4.0000 mg | Freq: Four times a day (QID) | INTRAMUSCULAR | Status: DC | PRN

## 2020-09-04 MED ORDER — INSULIN ASPART 100 UNIT/ML IJ SOLN
0.0000 [IU] | Freq: Every day | INTRAMUSCULAR | Status: DC
  Filled 2020-09-04: qty 0.05

## 2020-09-04 MED ORDER — GUAIFENESIN ER 600 MG PO TB12
600.0000 mg | ORAL_TABLET | Freq: Two times a day (BID) | ORAL | Status: DC
  Administered 2020-09-04 – 2020-09-05 (×2): 600 mg via ORAL
  Filled 2020-09-04 (×2): qty 1

## 2020-09-04 MED ORDER — POLYVINYL ALCOHOL 1.4 % OP SOLN
1.0000 [drp] | Freq: Three times a day (TID) | OPHTHALMIC | Status: DC
  Administered 2020-09-04 – 2020-09-05 (×2): 1 [drp] via OPHTHALMIC
  Filled 2020-09-04: qty 15

## 2020-09-04 MED ORDER — FOLIC ACID 1 MG PO TABS
1.0000 mg | ORAL_TABLET | Freq: Every day | ORAL | Status: DC
  Administered 2020-09-04 – 2020-09-05 (×2): 1 mg via ORAL
  Filled 2020-09-04 (×2): qty 1

## 2020-09-04 MED ORDER — INSULIN ASPART 100 UNIT/ML IJ SOLN
0.0000 [IU] | Freq: Three times a day (TID) | INTRAMUSCULAR | Status: DC
  Administered 2020-09-04 – 2020-09-05 (×3): 7 [IU] via SUBCUTANEOUS
  Filled 2020-09-04: qty 0.2

## 2020-09-04 MED ORDER — DULOXETINE HCL 30 MG PO CPEP
60.0000 mg | ORAL_CAPSULE | Freq: Two times a day (BID) | ORAL | Status: DC
  Administered 2020-09-04 – 2020-09-05 (×2): 60 mg via ORAL
  Filled 2020-09-04 (×2): qty 2

## 2020-09-04 MED ORDER — TAMSULOSIN HCL 0.4 MG PO CAPS
0.4000 mg | ORAL_CAPSULE | Freq: Every day | ORAL | Status: DC
  Administered 2020-09-04 – 2020-09-05 (×2): 0.4 mg via ORAL
  Filled 2020-09-04 (×2): qty 1

## 2020-09-04 MED ORDER — CYCLOBENZAPRINE HCL 10 MG PO TABS
20.0000 mg | ORAL_TABLET | Freq: Every day | ORAL | Status: DC
  Administered 2020-09-04: 20 mg via ORAL
  Filled 2020-09-04: qty 2

## 2020-09-04 MED ORDER — TRAMADOL HCL 50 MG PO TABS
100.0000 mg | ORAL_TABLET | Freq: Three times a day (TID) | ORAL | Status: DC | PRN
  Administered 2020-09-04: 100 mg via ORAL
  Filled 2020-09-04: qty 2

## 2020-09-04 MED ORDER — GABAPENTIN 300 MG PO CAPS
900.0000 mg | ORAL_CAPSULE | Freq: Every day | ORAL | Status: DC
  Administered 2020-09-04: 900 mg via ORAL
  Filled 2020-09-04: qty 3

## 2020-09-04 MED ORDER — NITROGLYCERIN 0.4 MG SL SUBL: 0.4000 mg | SUBLINGUAL_TABLET | SUBLINGUAL | Status: DC | PRN

## 2020-09-04 MED ORDER — MYCOPHENOLATE MOFETIL 250 MG PO CAPS
1500.0000 mg | ORAL_CAPSULE | Freq: Two times a day (BID) | ORAL | Status: DC
  Administered 2020-09-04 – 2020-09-05 (×3): 1500 mg via ORAL
  Filled 2020-09-04 (×4): qty 6

## 2020-09-04 MED ORDER — TRAZODONE HCL 50 MG PO TABS
75.0000 mg | ORAL_TABLET | Freq: Every day | ORAL | Status: DC
  Administered 2020-09-04: 75 mg via ORAL
  Filled 2020-09-04: qty 2

## 2020-09-04 MED ORDER — LISINOPRIL 20 MG PO TABS
20.0000 mg | ORAL_TABLET | Freq: Every day | ORAL | Status: DC
  Administered 2020-09-04 – 2020-09-05 (×2): 20 mg via ORAL
  Filled 2020-09-04 (×2): qty 1

## 2020-09-04 MED ORDER — ALBUTEROL SULFATE (2.5 MG/3ML) 0.083% IN NEBU: 2.5000 mg | INHALATION_SOLUTION | Freq: Four times a day (QID) | RESPIRATORY_TRACT | Status: DC | PRN

## 2020-09-04 MED ORDER — PREDNISONE 20 MG PO TABS
40.0000 mg | ORAL_TABLET | Freq: Every day | ORAL | Status: DC
  Administered 2020-09-05: 40 mg via ORAL
  Filled 2020-09-04: qty 2

## 2020-09-04 MED ORDER — ASPIRIN EC 81 MG PO TBEC
81.0000 mg | DELAYED_RELEASE_TABLET | Freq: Every day | ORAL | Status: DC
  Administered 2020-09-04 – 2020-09-05 (×2): 81 mg via ORAL
  Filled 2020-09-04 (×2): qty 1

## 2020-09-04 MED ORDER — SALINE SPRAY 0.65 % NA SOLN
2.0000 | Freq: Four times a day (QID) | NASAL | Status: DC | PRN
  Filled 2020-09-04: qty 44

## 2020-09-04 MED ORDER — FLUTICASONE PROPIONATE 50 MCG/ACT NA SUSP
2.0000 | Freq: Every day | NASAL | Status: DC
  Administered 2020-09-04: 2 via NASAL
  Filled 2020-09-04: qty 16

## 2020-09-04 MED ORDER — ARFORMOTEROL TARTRATE 15 MCG/2ML IN NEBU
15.0000 ug | INHALATION_SOLUTION | Freq: Two times a day (BID) | RESPIRATORY_TRACT | Status: DC
  Administered 2020-09-04 – 2020-09-05 (×2): 15 ug via RESPIRATORY_TRACT
  Filled 2020-09-04 (×3): qty 2

## 2020-09-04 MED ORDER — LURASIDONE HCL 40 MG PO TABS
40.0000 mg | ORAL_TABLET | Freq: Every day | ORAL | Status: DC
  Administered 2020-09-04: 40 mg via ORAL
  Filled 2020-09-04 (×2): qty 1

## 2020-09-04 MED ORDER — POLYVINYL ALCOHOL 1.4 % OP SOLN
1.0000 [drp] | Freq: Four times a day (QID) | OPHTHALMIC | Status: DC | PRN
  Filled 2020-09-04: qty 15

## 2020-09-04 MED ORDER — ENOXAPARIN SODIUM 40 MG/0.4ML IJ SOSY
40.0000 mg | PREFILLED_SYRINGE | INTRAMUSCULAR | Status: DC
  Administered 2020-09-04: 40 mg via SUBCUTANEOUS
  Filled 2020-09-04: qty 0.4

## 2020-09-04 MED ORDER — ACETAMINOPHEN 325 MG PO TABS
650.0000 mg | ORAL_TABLET | Freq: Four times a day (QID) | ORAL | Status: DC | PRN
  Administered 2020-09-04: 650 mg via ORAL
  Filled 2020-09-04: qty 2

## 2020-09-04 MED ORDER — HYDRALAZINE HCL 20 MG/ML IJ SOLN: 10.0000 mg | Freq: Three times a day (TID) | INTRAMUSCULAR | Status: DC | PRN

## 2020-09-04 MED ORDER — IPRATROPIUM-ALBUTEROL 0.5-2.5 (3) MG/3ML IN SOLN
3.0000 mL | Freq: Four times a day (QID) | RESPIRATORY_TRACT | Status: DC
  Administered 2020-09-04 (×3): 3 mL via RESPIRATORY_TRACT
  Filled 2020-09-04 (×2): qty 3

## 2020-09-04 MED ORDER — IPRATROPIUM-ALBUTEROL 0.5-2.5 (3) MG/3ML IN SOLN
3.0000 mL | Freq: Three times a day (TID) | RESPIRATORY_TRACT | Status: DC
  Administered 2020-09-05: 3 mL via RESPIRATORY_TRACT
  Filled 2020-09-04: qty 3

## 2020-09-04 MED ORDER — OXCARBAZEPINE 300 MG PO TABS
600.0000 mg | ORAL_TABLET | Freq: Three times a day (TID) | ORAL | Status: DC
  Administered 2020-09-04 – 2020-09-05 (×4): 600 mg via ORAL
  Filled 2020-09-04 (×5): qty 2

## 2020-09-04 MED ORDER — ADULT MULTIVITAMIN W/MINERALS CH
1.0000 | ORAL_TABLET | Freq: Every day | ORAL | Status: DC
  Administered 2020-09-04 – 2020-09-05 (×2): 1 via ORAL
  Filled 2020-09-04 (×2): qty 1

## 2020-09-04 MED ORDER — DILTIAZEM HCL ER COATED BEADS 180 MG PO CP24
180.0000 mg | ORAL_CAPSULE | Freq: Every day | ORAL | Status: DC
  Administered 2020-09-04 – 2020-09-05 (×2): 180 mg via ORAL
  Filled 2020-09-04 (×2): qty 1

## 2020-09-04 MED ORDER — ONDANSETRON HCL 4 MG PO TABS: 4.0000 mg | ORAL_TABLET | Freq: Four times a day (QID) | ORAL | Status: DC | PRN

## 2020-09-04 MED ORDER — METHYLPREDNISOLONE SODIUM SUCC 125 MG IJ SOLR
60.0000 mg | Freq: Two times a day (BID) | INTRAMUSCULAR | Status: AC
  Administered 2020-09-04 (×2): 60 mg via INTRAVENOUS
  Filled 2020-09-04 (×2): qty 2

## 2020-09-04 MED ORDER — MELATONIN 3 MG PO TABS
6.0000 mg | ORAL_TABLET | Freq: Every day | ORAL | Status: DC
  Administered 2020-09-04: 6 mg via ORAL
  Filled 2020-09-04: qty 2

## 2020-09-04 NOTE — Plan of Care (Signed)
  Problem: Education: Goal: Knowledge of General Education information will improve Description: Including pain rating scale, medication(s)/side effects and non-pharmacologic comfort measures Outcome: Progressing   Problem: Health Behavior/Discharge Planning: Goal: Ability to manage health-related needs will improve Outcome: Progressing   Problem: Clinical Measurements: Goal: Ability to maintain clinical measurements within normal limits will improve Outcome: Progressing Goal: Will remain free from infection Outcome: Progressing   Problem: Education: Goal: Knowledge of disease or condition will improve Outcome: Progressing Goal: Knowledge of the prescribed therapeutic regimen will improve Outcome: Progressing   Problem: Activity: Goal: Ability to tolerate increased activity will improve Outcome: Progressing Goal: Will verbalize the importance of balancing activity with adequate rest periods Outcome: Progressing   Problem: Respiratory: Goal: Ability to maintain a clear airway will improve Outcome: Progressing Goal: Levels of oxygenation will improve Outcome: Progressing Goal: Ability to maintain adequate ventilation will improve Outcome: Progressing

## 2020-09-04 NOTE — Progress Notes (Signed)
Patient is refusing to wear our mask for CPAP tonight. States he will wear oxygen only. Encouraged patient if he changes his mind to let RN know to call RT.

## 2020-09-04 NOTE — H&P (Signed)
History and Physical    RAFE MACKOWSKI BZJ:[id number] DOB: 1950/04/06 DOA: 09/03/2020  PCP: [facility]  Patient coming from: Home  Chief Complaint: Shortness of breath  HPI: Mario [name] is a 70 y.o. male with medical history significant of COPD, HTN, anxiety/depression, DM2, NSCLC. Presenting with dyspnea. Reports that he became short of breath yesterday afternoon after placing an air filter in his vent. He sat down to rest, but it took a long time for him to recover on his 3L [place] O2 supplementation. He didn't have any chest pain or palpitations. He remained short of breath into the evening, so he called the [place] triage nurse. They recommended going to the ED for evaluation. He denies any other aggravating or alleviating factors.    ED Course: Initially he was thought to have PNA. Started on CAP coverage. He was also given prednisone and nebs. CTA of chest obtained. Negative for PE. TRH was called for admission.  Review of Systems:  Denies CP, palpitations, lightheadedness, dizziness, N/V/D, fever. Reports cough, dyspnea. Review of systems is otherwise negative for all not mentioned in HPI.   PMHx Past Medical History:  Diagnosis Date   Anxiety    Bronchitis    COPD (chronic obstructive pulmonary disease) ([place])    Depression    Hypertension    Mental disorder    MI (myocardial infarction) ([place])    ????   OSA (obstructive sleep apnea)    Suicide attempt ([place])    Tension pneumothorax 06/27/2016   Uveitis     PSHx Past Surgical History:  Procedure Laterality Date   BRONCHIAL BIOPSY  07/30/2020   Procedure: BRONCHIAL BIOPSIES;  Surgeon: Garner Nash, DO;  Location: [facility];  Service: Pulmonary;;   BRONCHIAL BRUSHINGS  07/30/2020   Procedure: BRONCHIAL BRUSHINGS;  Surgeon: Garner Nash, DO;  Location: [facility];  Service: Pulmonary;;   BRONCHIAL NEEDLE ASPIRATION BIOPSY  07/30/2020   Procedure: BRONCHIAL NEEDLE ASPIRATION BIOPSIES;  Surgeon: Garner Nash, DO;  Location: [facility];  Service: Pulmonary;;   BRONCHIAL WASHINGS  07/30/2020   Procedure: BRONCHIAL WASHINGS;  Surgeon: Garner Nash, DO;  Location: [facility];  Service: Pulmonary;;   CHEST TUBE INSERTION Left 06/27/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     SKIN CANCER EXCISION     VIDEO BRONCHOSCOPY WITH ENDOBRONCHIAL NAVIGATION Left 07/30/2020   Procedure: VIDEO BRONCHOSCOPY WITH ENDOBRONCHIAL NAVIGATION;  Surgeon: Garner Nash, DO;  Location: [facility];  Service: Pulmonary;  Laterality: Left;    SocHx  reports that he quit smoking about 4 years ago. His smoking use included cigarettes. He has a 35.00 pack-year smoking history. He has never used smokeless tobacco. He reports that he does not drink alcohol and does not use drugs.  Allergies  Allergen Reactions   Demerol [Meperidine] Nausea And Vomiting and Other (See Comments)    Made the patient "violently sick"   Zocor [Simvastatin] Nausea And Vomiting and Other (See Comments)    Made him very jittery, also    FamHx Family History  Problem Relation Age of Onset   Dementia Father     Prior to Admission medications   Medication Sig Start Date End Date Taking? Authorizing Provider  acetaminophen (TYLENOL) 500 MG tablet Take 1,000 mg by mouth every 6 (six) hours as needed for moderate pain.   Yes [provider]  albuterol (PROVENTIL) (2.5 MG/3ML) 0.083% nebulizer solution Inhale 3 mLs (2.5 mg total) by nebulization every 4 (four) hours as needed for wheezing  or shortness of breath. 08/04/20 08/04/21 Yes Lavina Hamman, MD  albuterol (VENTOLIN HFA) 108 (90 Base) MCG/ACT inhaler Inhale 2 puffs into the lungs every 6 (six) hours as needed for wheezing. 06/02/20  Yes [provider]  aspirin 81 MG EC tablet Take 81 mg by mouth daily.   Yes [provider]  b complex vitamins capsule Take 1 capsule by mouth daily.   Yes [provider]  bisacodyl (DULCOLAX) 5 MG EC tablet Take 1 tablet (5 mg total) by  mouth daily at 12 noon. 08/04/20  Yes Lavina Hamman, MD  buPROPion (WELLBUTRIN XL) 300 MG 24 hr tablet Take 300 mg by mouth daily. 02/05/20  Yes [provider]  busPIRone (BUSPAR) 15 MG tablet Take 15 mg by mouth 2 (two) times daily.   Yes [provider]  calcium-vitamin D (OSCAL WITH D) 500-200 MG-UNIT tablet Take 1 tablet by mouth 2 (two) times daily with a meal.   Yes [provider]  Cholecalciferol (VITAMIN D-3) 25 MCG (1000 UT) CAPS Take 1,000 Units by mouth daily.   Yes [provider]  cyclobenzaprine (FLEXERIL) 10 MG tablet Take 20 mg by mouth at bedtime.   Yes [provider]  diltiazem (CARDIZEM CD) 180 MG 24 hr capsule Take 180 mg by mouth daily.   Yes [provider]  docusate sodium (COLACE) 100 MG capsule Take 2 capsules (200 mg total) by mouth 2 (two) times daily. 08/04/20  Yes Lavina Hamman, MD  DULoxetine (CYMBALTA) 60 MG capsule Take 1 capsule (60 mg total) by mouth 2 (two) times daily. Patient taking differently: Take 60 mg by mouth 2 (two) times daily with a meal. 07/21/17  Yes Lavina Hamman, MD  feeding supplement (ENSURE ENLIVE / ENSURE PLUS) LIQD Take 237 mLs by mouth 3 (three) times daily between meals. 08/04/20  Yes Lavina Hamman, MD  fluticasone (FLONASE) 50 MCG/ACT nasal spray Place 2 sprays into both nostrils daily.    Yes [provider]  folic acid (FOLVITE) 1 MG tablet Take 1 mg by mouth daily.   Yes [provider]  gabapentin (NEURONTIN) 300 MG capsule Take 900 mg by mouth at bedtime.   Yes [provider]  Garlic Oil 3 MG CAPS Take 3 mg by mouth daily.   Yes [provider]  lisinopril (ZESTRIL) 20 MG tablet Take 20 mg by mouth daily.   Yes [provider]  lurasidone (LATUDA) 40 MG TABS tablet Take 40 mg by mouth daily after supper.   Yes [provider]  Melatonin 3 MG TABS Take 6 mg by mouth at bedtime.   Yes [provider]  metFORMIN  (GLUCOPHAGE) 500 MG tablet Take 500 mg by mouth 2 (two) times daily with a meal.   Yes [provider]  methotrexate (RHEUMATREX) 2.5 MG tablet Take 6 tablets (15 mg total) by mouth every Saturday. Hold until seen by rheumatology  Caution:Chemotherapy. Protect from light. Patient taking differently: Take 15 mg by mouth every Saturday. 07/22/17  Yes Lavina Hamman, MD  Multiple Vitamin (MULTIVITAMIN WITH MINERALS) TABS tablet Take 1 tablet by mouth daily.   Yes [provider]  mycophenolate (CELLCEPT) 250 MG capsule Take 1,500 mg by mouth 2 (two) times daily.   Yes [provider]  nitroGLYCERIN (NITROSTAT) 0.4 MG SL tablet Place 1 tablet (0.4 mg total) under the tongue every 5 (five) minutes as needed for chest pain. 07/03/19  Yes Danford, Suann Larry, MD  Olodaterol HCl (STRIVERDI RESPIMAT) 2.5 MCG/ACT AERS Inhale 2 puffs into the lungs daily.   Yes [provider]  OVER THE COUNTER MEDICATION Place 1 drop into both ears daily as needed (itching). Miracell ProEar for irritated itchy ears   Yes [provider]  OVER THE COUNTER MEDICATION Take 2 tablets by mouth at bedtime. Pure Zzzz   Yes [provider]  oxcarbazepine (TRILEPTAL) 600 MG tablet Take 600 mg by mouth 3 (three) times daily. 07/16/20  Yes [provider]  polyethylene glycol (MIRALAX / GLYCOLAX) 17 g packet Take 17 g by mouth daily.   Yes [provider]  polyvinyl alcohol (LIQUIFILM TEARS) 1.4 % ophthalmic solution Place 1 drop into both eyes 4 (four) times daily as needed for dry eyes.   Yes [provider]  PRESCRIPTION MEDICATION Inhale into the lungs See admin instructions. CPAP- At bedtime and during during any naps   Yes [provider]  Propylene Glycol (SYSTANE BALANCE) 0.6 % SOLN Place 1 drop into both eyes in the morning, at noon, and at bedtime.    Yes [provider]  sodium chloride (OCEAN) 0.65 % SOLN nasal spray Place 2  sprays into both nostrils 4 (four) times daily as needed for congestion.   Yes [provider]  tamsulosin (FLOMAX) 0.4 MG CAPS capsule Take 0.4 mg by mouth daily.   Yes [provider]  Tiotropium Bromide-Olodaterol (STIOLTO RESPIMAT) 2.5-2.5 MCG/ACT AERS Inhale 2 puffs into the lungs daily.   Yes [provider]  traMADol (ULTRAM) 50 MG tablet Take 100 mg by mouth at bedtime.   Yes [provider]  traZODone (DESYREL) 50 MG tablet Take 75 mg by mouth at bedtime.   Yes [provider]  VITAMIN A PO Take 1 tablet by mouth daily.   Yes [provider]  vitamin C (ASCORBIC ACID) 500 MG tablet Take 500 mg by mouth 2 (two) times daily.    Yes [provider]  Certolizumab Pegol 2 X 200 MG/ML PSKT Inject into the skin. INJECT 400MG  (2 SYRINGES) SUBCUTANEOUSLY EVERY 2 WEEKS FOR 6 WEEKS, THEN INJECT 200MG  (1 SYRINGE) EVERY 2 WEEKS --400 MG ON WEEK 0, 2 AND 4 FOR 3 DOSES ONLY, THEN MAINTENANCE: 200 MG  EVERY 2 WEEKS --400 MG ON WEEK 0, 2 AND 4 FOR 3 DOSES ONLY, THEN MAINTENANCE: 200 MG   EVERY 2 WEEKS 08/06/20   [provider]  predniSONE (DELTASONE) 10 MG tablet Take 3 tablets (30mg ) daily for 3 days, then 2 tablets daily for 3 days, then 1 tablet daily for 3 days, then stop Patient not taking: Reported on 09/04/2020 08/04/20   Lavina Hamman, MD    Physical Exam: Vitals:   09/04/20 0400 09/04/20 0500 09/04/20 0600 09/04/20 0654  BP: (!) 144/75 (!) 144/92 (!) 160/134 (!) 161/96  Pulse: 92 98 99 (!) 109  Resp: 18 18 18 18   Temp:      TempSrc:      SpO2: 95% 96% 93% 98%    General: 70 y.o. male resting in bed in NAD Eyes: PERRL, normal sclera ENMT: Nares patent w/o discharge, orophaynx clear, dentition normal, ears w/o discharge/lesions/ulcers Neck: Supple, trachea midline Cardiovascular: RRR, +S1, S2, no m/g/r, equal pulses throughout Respiratory: decreased at bases, soft exp wheeze noted, slightly increase WOB on 4L [place] GI: BS+,  NDNT, no masses noted, no organomegaly noted MSK: No c/c; mild b/l pedal edema Skin: No rashes, bruises, ulcerations noted Neuro: A&O x 3,  no focal deficits Psyc: Appropriate interaction and affect, calm/cooperative  Labs on Admission: I have personally reviewed following labs and imaging studies  CBC: Recent Labs  Lab 09/03/20 2119  WBC 11.3*  NEUTROABS 5.7  HGB 15.3  HCT 45.8  MCV 97.9  PLT 097   Basic Metabolic Panel: Recent Labs  Lab 09/03/20 2119  NA 139  K 4.3  CL 101  CO2 27  GLUCOSE 124*  BUN 17  CREATININE 1.33*  CALCIUM 9.5   GFR: CrCl cannot be calculated (Unknown ideal weight.). Liver Function Tests: Recent Labs  Lab 09/03/20 2119  AST 27  ALT 38  ALKPHOS 53  BILITOT 0.4  PROT 7.3  ALBUMIN 4.2   No results for input(s): LIPASE, AMYLASE in the last 168 hours. No results for input(s): AMMONIA in the last 168 hours. Coagulation Profile: No results for input(s): INR, PROTIME in the last 168 hours. Cardiac Enzymes: No results for input(s): CKTOTAL, CKMB, CKMBINDEX, TROPONINI in the last 168 hours. BNP (last 3 results) No results for input(s): PROBNP in the last 8760 hours. HbA1C: No results for input(s): HGBA1C in the last 72 hours. CBG: No results for input(s): GLUCAP in the last 168 hours. Lipid Profile: No results for input(s): CHOL, HDL, LDLCALC, TRIG, CHOLHDL, LDLDIRECT in the last 72 hours. Thyroid Function Tests: No results for input(s): TSH, T4TOTAL, FREET4, T3FREE, THYROIDAB in the last 72 hours. Anemia Panel: No results for input(s): VITAMINB12, FOLATE, FERRITIN, TIBC, IRON, RETICCTPCT in the last 72 hours. Urine analysis:    Component Value Date/Time   COLORURINE YELLOW 09/03/2020 2330   APPEARANCEUR CLEAR 09/03/2020 2330   LABSPEC 1.042 (H) 09/03/2020 2330   PHURINE 7.0 09/03/2020 2330   GLUCOSEU NEGATIVE 09/03/2020 2330   HGBUR NEGATIVE 09/03/2020 2330   BILIRUBINUR NEGATIVE 09/03/2020 2330   KETONESUR NEGATIVE 09/03/2020  2330   PROTEINUR NEGATIVE 09/03/2020 2330   UROBILINOGEN 1.0 01/12/2013 0024   NITRITE NEGATIVE 09/03/2020 2330   LEUKOCYTESUR NEGATIVE 09/03/2020 2330    Radiological Exams on Admission: CT Angio Chest PE W/Cm &/Or Wo Cm  Result Date: 09/03/2020 CLINICAL DATA:  Shortness of breath, lung cancer, hypoxia. EXAM: CT ANGIOGRAPHY CHEST WITH CONTRAST TECHNIQUE: Multidetector CT imaging of the chest was performed using the standard protocol during bolus administration of intravenous contrast. Multiplanar CT image reconstructions and MIPs were obtained to evaluate the vascular anatomy. CONTRAST:  21mL OMNIPAQUE IOHEXOL 350 MG/ML SOLN COMPARISON:  Chest radiograph dated 09/03/2020. CT chest dated 07/26/2020. FINDINGS: Cardiovascular: Satisfactory opacification the bilateral pulmonary arteries to the lobar level. Evaluation is also constrained due to respiratory motion. Within that constraint, there is no evidence of pulmonary embolism. Although not tailored for evaluation of the thoracic aorta, there is no evidence of thoracic aortic aneurysm or dissection. Atherosclerotic calcifications descending thoracic aorta. The heart is normal in size.  No pericardial effusion. Coronary atherosclerosis of the LAD and right coronary artery. Mediastinum/Nodes: No suspicious mediastinal lymphadenopathy. Visualized thyroid is unremarkable. Lungs/Pleura: Moderate centrilobular and paraseptal emphysematous changes, upper lung predominant. 4.1 x 3.0 cm left lower lobe nodule (series 7/image 114), poorly visualized due to motion degradation, corresponding to the patient's known primary bronchogenic neoplasm. Small left pleural effusion. No pneumothorax. Upper Abdomen: Visualized upper abdomen is grossly unremarkable. Musculoskeletal: Degenerative changes of the visualized thoracolumbar spine. Review of the MIP images confirms the above findings. IMPRESSION: No evidence of pulmonary embolism. 4.1 cm left lower lobe nodule,  corresponding to the patient's known primary bronchogenic neoplasm, poorly visualized due to motion degradation. Small  left pleural effusion. Aortic Atherosclerosis (ICD10-I70.0) and Emphysema (ICD10-J43.9). Electronically Signed   By: Julian Hy M.D.   On: 09/03/2020 22:56   DG Chest Port 1 View  Result Date: 09/03/2020 CLINICAL DATA:  Shortness of breath recent diagnosis of lung cancer EXAM: PORTABLE CHEST 1 VIEW COMPARISON:  07/30/2020, chest CT 07/26/2020 FINDINGS: Emphysema with bronchitic changes. Ill-defined left lower lobe opacity likely corresponds to patient's CT lung mass. Scarring or atelectasis at the bases. Stable cardiomediastinal silhouette. IMPRESSION: 1. Emphysema with scarring or atelectasis at the bases 2. Ill-defined left lung base opacity likely corresponds to the CT demonstrated lung mass Electronically Signed   By: Donavan Foil M.D.   On: 09/03/2020 21:36    EKG: Independently reviewed. Sinus, no st elevations  Assessment/Plan COPD exacerbation Chronic hypoxic respiratory failure on 3L [place] at baseline Lung CA OSA on CPap w/ O2     - admitted to inpt, tele     - presenting as COPD exacerbation; will continue steroids and start duonebs     - CTA w/o PNA; will hold further abx for now and check procal     - IS, guaifenesin     - Cpap at night; he is normally on 3L [place] and w/ CPAP as well  HTN     - continue home regimen  DM2     - SSI, glucose checks, DM diet     - A1c 6 weeks ago was 6.9  Anxiety/Depresison     - continue home regimen  Morbid obesity     - counsel on diet, lifestyle changes; follow up outpt  Non-small cell lung CA     - to start chemo soon w/ [facility]? Continue outpt follow up  DVT prophylaxis: lovenox  Code Status: FULL  Family Communication: None at bedside  Consults called: None   Status is: Inpatient  Remains inpatient appropriate because:Inpatient level of care appropriate due to severity of illness  Dispo: The patient is from:  Home              Anticipated d/c is to: Home              Patient currently is not medically stable to d/c.   Difficult to place patient No  Time spent coordinating admission: 70 minutes  [facility]  If 7PM-7AM, please contact night-coverage [email or web address]  09/04/2020, 8:12 AM

## 2020-09-04 NOTE — ED Notes (Signed)
Pt c/o 8/10 lower back pain. Pt reports this pain is chronic.  Messaged provider to make aware, await call back.

## 2020-09-04 NOTE — ED Notes (Signed)
Pt removed heart monitor leads, refusing to wear them.

## 2020-09-04 NOTE — ED Notes (Signed)
Dr.Rathore placed order for tylenol for pt's pain.

## 2020-09-05 DIAGNOSIS — J441 Chronic obstructive pulmonary disease with (acute) exacerbation: Principal | ICD-10-CM

## 2020-09-05 LAB — COMPREHENSIVE METABOLIC PANEL
ALT: 33 U/L (ref 0–44)
AST: 25 U/L (ref 15–41)
Albumin: 4.2 g/dL (ref 3.5–5.0)
Alkaline Phosphatase: 44 U/L (ref 38–126)
Anion gap: 9 (ref 5–15)
BUN: 18 mg/dL (ref 8–23)
CO2: 25 mmol/L (ref 22–32)
Calcium: 8.9 mg/dL (ref 8.9–10.3)
Chloride: 101 mmol/L (ref 98–111)
Creatinine, Ser: 1.05 mg/dL (ref 0.61–1.24)
GFR, Estimated: >60 mL/min (ref >60)
Glucose, Bld: 234 mg/dL — ABNORMAL HIGH (ref 70–99)
Potassium: 4.4 mmol/L (ref 3.5–5.1)
Sodium: 135 mmol/L (ref 135–145)
Total Bilirubin: 0.6 mg/dL (ref 0.3–1.2)
Total Protein: 6.8 g/dL (ref 6.5–8.1)

## 2020-09-05 LAB — GLUCOSE, CAPILLARY
Glucose-Capillary: 227 mg/dL — ABNORMAL HIGH (ref 70–99)
Glucose-Capillary: 281 mg/dL — ABNORMAL HIGH (ref 70–99)

## 2020-09-05 LAB — CBC
HCT: 43.2 % (ref 39.0–52.0)
Hemoglobin: 14.3 g/dL (ref 13.0–17.0)
MCH: 32.5 pg (ref 26.0–34.0)
MCHC: 33.1 g/dL (ref 30.0–36.0)
MCV: 98.2 fL (ref 80.0–100.0)
Platelets: 249 10*3/uL (ref 150–400)
RBC: 4.4 MIL/uL (ref 4.22–5.81)
RDW: 13.4 % (ref 11.5–15.5)
WBC: 11.3 10*3/uL — ABNORMAL HIGH (ref 4.0–10.5)
nRBC: 0 % (ref 0.0–0.2)

## 2020-09-05 MED ORDER — PREDNISONE 10 MG PO TABS: 40.0000 mg | ORAL_TABLET | Freq: Every day | ORAL | 0 refills | Status: AC

## 2020-09-05 NOTE — Progress Notes (Signed)
Pt. Discharged via wheelchair accompanied by staff. Pt. Is alert and oriented, no distress. Discharge instructions and education given, pt. Verbalized understanding. All belongings are with the patient.

## 2020-09-05 NOTE — Discharge Summary (Signed)
Physician Discharge Summary  TYON CERASOLI QQP:[id number] DOB: [birth year] DOA: 09/03/2020  PCP: [facility]  Admit date: 09/03/2020 Discharge date: 09/05/2020  Admitted From: Home Disposition: Home   Recommendations for Outpatient Follow-up:  Follow up with PCP in 1-2 weeks Urged to follow up with oncology soon. Gave number for [facility] as pt is interested in establishing there as he doesn't have reliable rides to [facility].   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 Initially he was thought to have PNA. Started on CAP coverage. He was also given prednisone and nebs. CTA of chest revealed no PE or lobar consolidation. PCT negative, WBC 9k, covid test negative. He was admitted, given steroids and scheduled nebulized bronchodilators with significant improvement and return to his baseline oxygen requirement the following day. He is stable and requesting discharge home.   Discharge Diagnoses:  Active Problems:   COPD exacerbation (HCC)  COPD exacerbation Chronic hypoxic respiratory failure on 3L [place] at baseline Lung CA OSA on CPAP w/O2     - Continue 3L O2 at home, qHS CPAP.      - Continue steroids and start duonebs. He has "plenty" of nebs and supplies. He reports he has prednisone left over, though this was also sent to his pharmacy at the [place].    HTN     - Continue home regimen   DM2: A1c 6 weeks ago was 6.9. Carb modified diet recommended, especially during steroid burst.    Anxiety/Depresison     - Continue home regimen    Non-small cell lung CA     - Continue plan to have this [facility]. Urged pt to initiate treatment soon.  Obesity: Estimated body mass index is 42.88 kg/m as calculated from the following:   Height as of this encounter: 6\' 2"  (1.88 m).   Weight as of this encounter: 151.5 kg.  Discharge Instructions Discharge Instructions     Diet - low sodium heart healthy   Complete by: As directed    Diet Carb Modified   Complete by: As directed    Discharge instructions   Complete by: As directed    You were evaluated for COPD exacerbation and have improved, returning to your baseline oxygen requirement and improved respiratory effort. You are stable for discharge but will need to take prednisone 40mg  daily (starting tomorrow morning) for 3 days. You will also need to take albuterol through the nebulizer machine at home twice daily AND up to every 4 hours as needed for shortness of breath or wheezing.  If your symptoms return, seek medical attention right away. Otherwise, follow up with the [facility] for ongoing treatment. You will also need oncology follow up as soon as possible. You can call the [facility] here near [facility] at [phone] or follow up wherever you wish.   Increase activity slowly   Complete by: As directed       Allergies as of 09/05/2020       Reactions   Demerol [meperidine] Nausea And Vomiting, Other (See Comments)  Made the patient "violently sick"   Zocor [simvastatin] Nausea And Vomiting, Other (See Comments)   Made him very jittery, also        Medication List     TAKE these medications    acetaminophen 500 MG tablet Commonly known as: TYLENOL Take 1,000 mg by mouth every 6 (six) hours as needed for moderate pain.   albuterol 108 (90 Base) MCG/ACT inhaler Commonly known as: VENTOLIN HFA Inhale 2 puffs into the lungs every 6 (six) hours as needed for wheezing.   albuterol (2.5 MG/3ML) 0.083% nebulizer  solution Commonly known as: PROVENTIL Inhale 3 mLs (2.5 mg total) by nebulization every 4 (four) hours as needed for wheezing or shortness of breath.   aspirin 81 MG EC tablet Take 81 mg by mouth daily.   b complex vitamins capsule Take 1 capsule by mouth daily.   bisacodyl 5 MG EC tablet Commonly known as: DULCOLAX Take 1 tablet (5 mg total) by mouth daily at 12 noon.   buPROPion 300 MG 24 hr tablet Commonly known as: WELLBUTRIN XL Take 300 mg by mouth daily.   busPIRone 15 MG tablet Commonly known as: BUSPAR Take 15 mg by mouth 2 (two) times daily.   calcium-vitamin D 500-200 MG-UNIT tablet Commonly known as: OSCAL WITH D Take 1 tablet by mouth 2 (two) times daily with a meal.   Certolizumab Pegol 2 X 200 MG/ML Pskt Inject into the skin. INJECT 400MG  (2 SYRINGES) SUBCUTANEOUSLY EVERY 2 WEEKS FOR 6 WEEKS, THEN INJECT 200MG  (1 SYRINGE) EVERY 2 WEEKS --400 MG ON WEEK 0, 2 AND 4 FOR 3 DOSES ONLY, THEN MAINTENANCE: 200 MG  EVERY 2 WEEKS --400 MG ON WEEK 0, 2 AND 4 FOR 3 DOSES ONLY, THEN MAINTENANCE: 200 MG   EVERY 2 WEEKS   cyclobenzaprine 10 MG tablet Commonly known as: FLEXERIL Take 20 mg by mouth at bedtime.   diltiazem 180 MG 24 hr capsule Commonly known as: CARDIZEM CD Take 180 mg by mouth daily.   docusate sodium 100 MG capsule Commonly known as: COLACE Take 2 capsules (200 mg total) by mouth 2 (two) times daily.   DULoxetine 60 MG capsule Commonly known as: CYMBALTA Take 1 capsule (60 mg total) by mouth 2 (two) times daily. What changed: when to take this   feeding supplement Liqd Take 237 mLs by mouth 3 (three) times daily between meals.   fluticasone 50 MCG/ACT nasal spray Commonly known as: FLONASE Place 2 sprays into both nostrils daily.   folic acid 1 MG tablet Commonly known as: FOLVITE Take 1 mg by mouth daily.   gabapentin 300 MG capsule Commonly known as: NEURONTIN Take 900 mg by mouth at bedtime.   Garlic Oil 3 MG Caps Take 3 mg by mouth  daily.   lisinopril 20 MG tablet Commonly known as: ZESTRIL Take 20 mg by mouth daily.   lurasidone 40 MG Tabs tablet Commonly known as: LATUDA Take 40 mg by mouth daily after supper.   melatonin 3 MG Tabs tablet Take 6 mg by mouth at bedtime.   metFORMIN 500 MG tablet Commonly known as: GLUCOPHAGE Take 500 mg by mouth 2 (two) times daily with a meal.   methotrexate 2.5 MG tablet Commonly known as: RHEUMATREX Take 6 tablets (15 mg total) by mouth every Saturday. Hold until seen by rheumatology  Caution:Chemotherapy. Protect from light. What changed: additional instructions   multivitamin with minerals Tabs tablet Take 1 tablet by mouth daily.   mycophenolate 250 MG capsule  Commonly known as: CELLCEPT Take 1,500 mg by mouth 2 (two) times daily.   nitroGLYCERIN 0.4 MG SL tablet Commonly known as: NITROSTAT Place 1 tablet (0.4 mg total) under the tongue every 5 (five) minutes as needed for chest pain.   OVER THE COUNTER MEDICATION Place 1 drop into both ears daily as needed (itching). Miracell ProEar for irritated itchy ears   OVER THE COUNTER MEDICATION Take 2 tablets by mouth at bedtime. Pure Zzzz   oxcarbazepine 600 MG tablet Commonly known as: TRILEPTAL Take 600 mg by mouth 3 (three) times daily.   polyethylene glycol 17 g packet Commonly known as: MIRALAX / GLYCOLAX Take 17 g by mouth daily.   polyvinyl alcohol 1.4 % ophthalmic solution Commonly known as: LIQUIFILM TEARS Place 1 drop into both eyes 4 (four) times daily as needed for dry eyes.   predniSONE 10 MG tablet Commonly known as: DELTASONE Take 4 tablets (40 mg total) by mouth daily with breakfast for 3 days. Start taking on: September 06, 2020 What changed:  how much to take how to take this when to take this additional instructions   PRESCRIPTION MEDICATION Inhale into the lungs See admin instructions. CPAP- At bedtime and during during any naps   sodium chloride 0.65 % Soln nasal spray Commonly  known as: OCEAN Place 2 sprays into both nostrils 4 (four) times daily as needed for congestion.   Stiolto Respimat 2.5-2.5 MCG/ACT Aers Generic drug: Tiotropium Bromide-Olodaterol Inhale 2 puffs into the lungs daily.   Striverdi Respimat 2.5 MCG/ACT Aers Generic drug: Olodaterol HCl Inhale 2 puffs into the lungs daily.   Systane Balance 0.6 % Soln Generic drug: Propylene Glycol Place 1 drop into both eyes in the morning, at noon, and at bedtime.   tamsulosin 0.4 MG Caps capsule Commonly known as: FLOMAX Take 0.4 mg by mouth daily.   traMADol 50 MG tablet Commonly known as: ULTRAM Take 100 mg by mouth at bedtime.   traZODone 50 MG tablet Commonly known as: DESYREL Take 75 mg by mouth at bedtime.   VITAMIN A PO Take 1 tablet by mouth daily.   vitamin C 500 MG tablet Commonly known as: ASCORBIC ACID Take 500 mg by mouth 2 (two) times daily.   Vitamin D-3 25 MCG (1000 UT) Caps Take 1,000 Units by mouth daily.        Follow-up Information     Clinic, [place]. Schedule an appointment as soon as possible for a visit.   Contact information: [place] [address] [phone]                Allergies  Allergen Reactions   Demerol [Meperidine] Nausea And Vomiting and Other (See Comments)    Made the patient "violently sick"   Zocor [Simvastatin] Nausea And Vomiting and Other (See Comments)    Made him very jittery, also    Consultations: None  Procedures/Studies: CT Angio Chest PE W/Cm &/Or Wo Cm  Result Date: 09/03/2020 CLINICAL DATA:  Shortness of breath, lung cancer, hypoxia. EXAM: CT ANGIOGRAPHY CHEST WITH CONTRAST TECHNIQUE: Multidetector CT imaging of the chest was performed using the standard protocol during bolus administration of intravenous contrast. Multiplanar CT image reconstructions and MIPs were obtained to evaluate the vascular anatomy. CONTRAST:  2mL OMNIPAQUE IOHEXOL 350 MG/ML SOLN COMPARISON:   Chest radiograph dated 09/03/2020. CT chest dated 07/26/2020. FINDINGS: Cardiovascular: Satisfactory opacification the bilateral pulmonary arteries to the lobar level. Evaluation is also constrained due to respiratory motion. Within that  constraint, there is no evidence of pulmonary embolism. Although not tailored for evaluation of the thoracic aorta, there is no evidence of thoracic aortic aneurysm or dissection. Atherosclerotic calcifications descending thoracic aorta. The heart is normal in size.  No pericardial effusion. Coronary atherosclerosis of the LAD and right coronary artery. Mediastinum/Nodes: No suspicious mediastinal lymphadenopathy. Visualized thyroid is unremarkable. Lungs/Pleura: Moderate centrilobular and paraseptal emphysematous changes, upper lung predominant. 4.1 x 3.0 cm left lower lobe nodule (series 7/image 114), poorly visualized due to motion degradation, corresponding to the patient's known primary bronchogenic neoplasm. Small left pleural effusion. No pneumothorax. Upper Abdomen: Visualized upper abdomen is grossly unremarkable. Musculoskeletal: Degenerative changes of the visualized thoracolumbar spine. Review of the MIP images confirms the above findings. IMPRESSION: No evidence of pulmonary embolism. 4.1 cm left lower lobe nodule, corresponding to the patient's known primary bronchogenic neoplasm, poorly visualized due to motion degradation. Small left pleural effusion. Aortic Atherosclerosis (ICD10-I70.0) and Emphysema (ICD10-J43.9). Electronically Signed   By: Julian Hy M.D.   On: 09/03/2020 22:56   DG Chest Port 1 View  Result Date: 09/03/2020 CLINICAL DATA:  Shortness of breath recent diagnosis of lung cancer EXAM: PORTABLE CHEST 1 VIEW COMPARISON:  07/30/2020, chest CT 07/26/2020 FINDINGS: Emphysema with bronchitic changes. Ill-defined left lower lobe opacity likely corresponds to patient's CT lung mass. Scarring or atelectasis at the bases. Stable cardiomediastinal  silhouette. IMPRESSION: 1. Emphysema with scarring or atelectasis at the bases 2. Ill-defined left lung base opacity likely corresponds to the CT demonstrated lung mass Electronically Signed   By: Donavan Foil M.D.   On: 09/03/2020 21:36      Subjective: Breathing at baseline this morning. Feels "much better." No chest pain or fever or purulent cough. Would like to go home.  Discharge Exam: Vitals:   09/05/20 0240 09/05/20 0750  BP: (!) 147/90   Pulse: 98   Resp: 18   Temp: (!) 97.5 F (36.4 C)   SpO2: 90% 91%   General: Frail male appearing older than stated age Cardiovascular: RRR, S1/S2 +, no rubs, no gallops Respiratory: No wheezes, fair air exchange. No crackles.  Abdominal: Soft, NT, ND, bowel sounds + Extremities: No edema, no cyanosis  Labs: BNP (last 3 results) Recent Labs    03/30/20 2034 07/25/20 2140 09/03/20 2119  BNP 57.3 23.7 65.7   Basic Metabolic Panel: Recent Labs  Lab 09/03/20 2119 09/04/20 0953 09/05/20 0515  NA 139  --  135  K 4.3  --  4.4  CL 101  --  101  CO2 27  --  25  GLUCOSE 124*  --  234*  BUN 17  --  18  CREATININE 1.33* 1.03 1.05  CALCIUM 9.5  --  8.9   Liver Function Tests: Recent Labs  Lab 09/03/20 2119 09/05/20 0515  AST 27 25  ALT 38 33  ALKPHOS 53 44  BILITOT 0.4 0.6  PROT 7.3 6.8  ALBUMIN 4.2 4.2   No results for input(s): LIPASE, AMYLASE in the last 168 hours. No results for input(s): AMMONIA in the last 168 hours. CBC: Recent Labs  Lab 09/03/20 2119 09/04/20 0953 09/05/20 0515  WBC 11.3* 9.0 11.3*  NEUTROABS 5.7  --   --   HGB 15.3 14.8 14.3  HCT 45.8 44.6 43.2  MCV 97.9 99.1 98.2  PLT 278 244 249   Cardiac Enzymes: No results for input(s): CKTOTAL, CKMB, CKMBINDEX, TROPONINI in the last 168 hours. BNP: Invalid input(s): POCBNP CBG: Recent Labs  Lab 09/04/20 1231 09/04/20  1709 09/04/20 2152 09/05/20 0803 09/05/20 1131  GLUCAP 204* 207* 196* 227* 281*   D-Dimer No results for input(s):  DDIMER in the last 72 hours. Hgb A1c No results for input(s): HGBA1C in the last 72 hours. Lipid Profile No results for input(s): CHOL, HDL, LDLCALC, TRIG, CHOLHDL, LDLDIRECT in the last 72 hours. Thyroid function studies No results for input(s): TSH, T4TOTAL, T3FREE, THYROIDAB in the last 72 hours.  Invalid input(s): FREET3 Anemia work up No results for input(s): VITAMINB12, FOLATE, FERRITIN, TIBC, IRON, RETICCTPCT in the last 72 hours. Urinalysis    Component Value Date/Time   COLORURINE YELLOW 09/03/2020 2330   APPEARANCEUR CLEAR 09/03/2020 2330   LABSPEC 1.042 (H) 09/03/2020 2330   PHURINE 7.0 09/03/2020 2330   GLUCOSEU NEGATIVE 09/03/2020 2330   HGBUR NEGATIVE 09/03/2020 2330   BILIRUBINUR NEGATIVE 09/03/2020 2330   KETONESUR NEGATIVE 09/03/2020 2330   PROTEINUR NEGATIVE 09/03/2020 2330   UROBILINOGEN 1.0 01/12/2013 0024   NITRITE NEGATIVE 09/03/2020 2330   LEUKOCYTESUR NEGATIVE 09/03/2020 2330    Microbiology Recent Results (from the past 240 hour(s))  SARS CORONAVIRUS 2 (TAT 6-24 HRS) Nasopharyngeal Nasopharyngeal Swab     Status: None   Collection Time: 09/03/20 11:47 PM   Specimen: Nasopharyngeal Swab  Result Value Ref Range Status   SARS Coronavirus 2 NEGATIVE NEGATIVE Final    Comment: (NOTE) SARS-CoV-2 target nucleic acids are NOT DETECTED.  The SARS-CoV-2 RNA is generally detectable in upper and lower respiratory specimens during the acute phase of infection. Negative results do not preclude SARS-CoV-2 infection, do not rule out co-infections with other pathogens, and should not be used as the sole basis for treatment or other patient management decisions. Negative results must be combined with clinical observations, patient history, and epidemiological information. The expected result is Negative.  Fact Sheet for Patients: SugarRoll.be  Fact Sheet for Healthcare Providers: https://www.woods-mathews.com/  This  test is not yet approved or cleared by the [place] FDA and  has been authorized for detection and/or diagnosis of SARS-CoV-2 by FDA under an Emergency Use Authorization (EUA). This EUA will remain  in effect (meaning this test can be used) for the duration of the COVID-19 declaration under Se ction 564(b)(1) of the Act, 21 U.S.C. section 360bbb-3(b)(1), unless the authorization is terminated or revoked sooner.  Performed at [facility], [place] [address] [place],  [address]     Time coordinating discharge: Approximately 40 minutes  Patrecia Pour, MD  [facility] 09/05/2020, 3:58 PM

## 2020-09-05 NOTE — Plan of Care (Signed)
  Problem: Education: Goal: Knowledge of disease or condition will improve Outcome: Not Progressing Goal: Knowledge of the prescribed therapeutic regimen will improve Outcome: Not Progressing Goal: Individualized Educational Video(s) Outcome: Not Progressing   Problem: Activity: Goal: Ability to tolerate increased activity will improve Outcome: Not Progressing Goal: Will verbalize the importance of balancing activity with adequate rest periods Outcome: Not Progressing   Problem: Respiratory: Goal: Ability to maintain a clear airway will improve Outcome: Not Progressing Goal: Levels of oxygenation will improve Outcome: Not Progressing Goal: Ability to maintain adequate ventilation will improve Outcome: Not Progressing   

## 2020-09-07 ENCOUNTER — Telehealth: Payer: Self-pay | Admitting: *Deleted

## 2020-09-07 DIAGNOSIS — R918 Other nonspecific abnormal finding of lung field: Secondary | ICD-10-CM

## 2020-09-07 NOTE — Telephone Encounter (Signed)
I received a message that patient needs to be seen at the [facility] due to limited transportation.  I update him on appt for med onc.  I will update transportation service that he is in need of assistance. He verbalized understanding of appt.

## 2020-09-09 ENCOUNTER — Telehealth: Payer: Self-pay | Admitting: Gastroenterology

## 2020-09-09 NOTE — Telephone Encounter (Signed)
See note on paper record.

## 2020-09-09 NOTE — Telephone Encounter (Signed)
Hey Dr. Fuller Plan,   We received a referral from [facility] for a colonoscopy. Patient last colonoscopy was in 2018. I have the records, will send for review. Can you please advise on scheduling?  Thank you.

## 2020-09-10 ENCOUNTER — Other Ambulatory Visit: Payer: Self-pay | Admitting: Family Medicine

## 2020-09-10 ENCOUNTER — Other Ambulatory Visit (HOSPITAL_COMMUNITY): Payer: Self-pay | Admitting: Family Medicine

## 2020-09-10 DIAGNOSIS — C3490 Malignant neoplasm of unspecified part of unspecified bronchus or lung: Secondary | ICD-10-CM

## 2020-09-10 NOTE — Telephone Encounter (Signed)
Left message to return call to give recommendation from Dr. Fuller Plan

## 2020-09-10 NOTE — Telephone Encounter (Signed)
Spoke with patient. Advised of recommendation, because of difficult and incomplete colonoscopy, provider believes should be seen at a tertiary center such as [facility], [place], or [facility]. Patient verbalized understanding and says he will contact the [place].

## 2020-09-11 ENCOUNTER — Encounter: Payer: Self-pay | Admitting: *Deleted

## 2020-09-11 NOTE — Progress Notes (Signed)
I received notification that patient has been contacted by transportation services and they will be offering rides to treatment.

## 2020-09-14 LAB — ACID FAST CULTURE WITH REFLEXED SENSITIVITIES (MYCOBACTERIA)
Acid Fast Culture: NEGATIVE
Acid Fast Culture: NEGATIVE

## 2020-09-16 NOTE — Progress Notes (Signed)
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 no Pacemaker/ICD? no Possible current pregnancy?no Is the patient on methotrexate? yes  Current Complaints / other details:  none     Vitals:   09/21/20 1229  BP: 134/80  Pulse: (!) 109  Resp: (!) 22  Temp: (!) 97.1 F (36.2 C)  TempSrc: Temporal  SpO2: 95%  Weight: (!) 344 lb (156 kg)  Height: 6\' 2"  (1.88 m)

## 2020-09-18 ENCOUNTER — Ambulatory Visit (HOSPITAL_COMMUNITY)
Admission: RE | Admit: 2020-09-18 | Discharge: 2020-09-18 | Disposition: A | Discharge status: Home / Self Care | Payer: No Typology Code available for payment source | Source: Ambulatory Visit | Attending: Internal Medicine | Admitting: Internal Medicine

## 2020-09-18 ENCOUNTER — Other Ambulatory Visit: Payer: Self-pay

## 2020-09-18 DIAGNOSIS — C3432 Malignant neoplasm of lower lobe, left bronchus or lung: Secondary | ICD-10-CM | POA: Diagnosis not present

## 2020-09-18 LAB — GLUCOSE, CAPILLARY: Glucose-Capillary: 169 mg/dL — ABNORMAL HIGH (ref 70–99)

## 2020-09-18 MED ORDER — FLUDEOXYGLUCOSE F - 18 (FDG) INJECTION
16.0000 | Freq: Once | INTRAVENOUS | Status: AC | PRN
  Administered 2020-09-18: 15.8 via INTRAVENOUS

## 2020-09-18 NOTE — Progress Notes (Signed)
Radiation Oncology         [phone] ________________________________  Initial Outpatient Consultation  Name: Mario [name] MRN: [id number]  Date: 09/21/2020  DOB: May 17, 1950  IR:[id number], Mario Liner, MD   REFERRING PHYSICIAN: [name], MD  DIAGNOSIS: The primary encounter diagnosis was Non-small cell cancer of left lung ([place]). A diagnosis of Lung nodule was also pertinent to this visit.  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 male who is accompanied by no one. he is seen as a courtesy of Dr. [name] for an opinion concerning radiation therapy as part of management for his recently diagnosed non-small cell lung cancer. The patient presented to the [facility] on 07/25/20 with dyspnea, abdominal pain, and constipation. A subsequent finding during this visit came from a CT of the abdomen and pelvis taken which showed a left lower lobe opacity with cavitations; noted to be concerning for malignancy. On ROS performed during ED visit, the patient denied chest pain, palpitations, nausea, vomiting, diarrhea, or fever. Chest CT to rule out PE taken on the following day (07/26/20) showed no evidence of pulmonary embolism, however it did again show the chronic airspace opacity with cavitation in the LLL, this time suspected to possibly resemble adenocarcinoma (cavitation and opacity noted to have been gradually enlarging since 2018).  The patient underwent a bronchoscopy with endobrachial navigation under Dr. Valeta Harms on 07/30/20. Fine needle aspiration of the left lower lobe revealed malignant cells consistent with adenocarcinoma.   The patient again presented to the [facility] ED on 09/03/20 with dyspnea and a cough. The patient reported that he became short of  breath in the afternoon prior after replacing an air filter in his vent. He stated that sat down to rest but it took him a long time to recover even while on his typical 3L O2 (via nasal cannula). The patient remained short of breath into the evening. Upon arrival at the ED, he denied chest pain or palpitations lightheadedness, dizziness, or fever. The patient was admitted.   Imaging taken during this ED visit (09/03/20) are as follows:  - Chest x-ray taken on 09/03/20 which showed the ill-defined left lung base opacity; corresponding to the CT demonstrated lung mass.  -Chest CT taken on 09/03/20 revealing the left lower lobe nodule to measure 4.1 cm; corresponding to the patients primary bronchogenic neoplasm. Small left pleural effusion was of note as well. (LLL nodule was noted to be poorly visualized due to motion degradation).   Other relevant scans to the patients cancer diagnosis are as follows:  -PET scan skull base to thigh taken on 09/18/20 ordered by Dr. [name] demonstrating: 1) the small left pleural nodules to be hypermetabolic and concerning for early pleural metastatic disease. Also seen again was small left pleural effusion with low activity; with the max SUV of these pleural nodules being 5.1. 2) Low grade activity (max SUV of 2.7) of the LLL cavitary lung lesion. 3) A high density small left pericardial lymph node with a max SUV of 4.9. -MRI of The Brain on 09/20/20 taken during an ED visit which showed no evidence of metastatic spread to the brain.  Of note: On 08/13/20 the patient presented to the [facility] ED with dizziness that had been ongoing for several weeks which he  described as fluctuating in severity. He additionally endorsed some lightheadedness. (No definitive ED diagnosis was drawn from this encounter).  PREVIOUS RADIATION THERAPY: No  PAST MEDICAL HISTORY:  Past Medical History:  Diagnosis Date   Anxiety    Bronchitis    COPD (chronic obstructive pulmonary  disease) ([place])    Depression    Hypertension    Mental disorder    MI (myocardial infarction) ([place])    ????   OSA (obstructive sleep apnea)    Suicide attempt ([place])    Tension pneumothorax 06/27/2016   Uveitis     PAST SURGICAL HISTORY: Past Surgical History:  Procedure Laterality Date   BRONCHIAL BIOPSY  07/30/2020   Procedure: BRONCHIAL BIOPSIES;  Surgeon: Garner Nash, DO;  Location: [facility];  Service: Pulmonary;;   BRONCHIAL BRUSHINGS  07/30/2020   Procedure: BRONCHIAL BRUSHINGS;  Surgeon: Garner Nash, DO;  Location: [facility];  Service: Pulmonary;;   BRONCHIAL NEEDLE ASPIRATION BIOPSY  07/30/2020   Procedure: BRONCHIAL NEEDLE ASPIRATION BIOPSIES;  Surgeon: Garner Nash, DO;  Location: [facility];  Service: Pulmonary;;   BRONCHIAL WASHINGS  07/30/2020   Procedure: BRONCHIAL WASHINGS;  Surgeon: Garner Nash, DO;  Location: [facility];  Service: Pulmonary;;   CHEST TUBE INSERTION Left 06/27/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     SKIN CANCER EXCISION     VIDEO BRONCHOSCOPY WITH ENDOBRONCHIAL NAVIGATION Left 07/30/2020   Procedure: VIDEO BRONCHOSCOPY WITH ENDOBRONCHIAL NAVIGATION;  Surgeon: Garner Nash, DO;  Location: [place];  Service: Pulmonary;  Laterality: Left;    FAMILY HISTORY:  Family History  Problem Relation Age of Onset   Dementia Father     SOCIAL HISTORY:  Social History   Tobacco Use   Smoking status: Former    Packs/day: 1.00    Years: 35.00    Pack years: 35.00    Types: Cigarettes    Quit date: 05/2016    Years since quitting: 4.3   Smokeless tobacco: Never  Vaping Use   Vaping Use: Never used  Substance Use Topics   Alcohol use: No    Alcohol/week: 0.0 standard drinks    Comment: denies use of any drugs or alcohol   Drug use: No    ALLERGIES:  Allergies  Allergen Reactions   Demerol [Meperidine] Nausea And Vomiting and Other (See Comments)    Made the patient "violently sick"   Zocor [Simvastatin] Nausea And Vomiting and  Other (See Comments)    Made him very jittery, also    MEDICATIONS:  Current Outpatient Medications  Medication Sig Dispense Refill   acetaminophen (TYLENOL) 500 MG tablet Take 1,000 mg by mouth every 6 (six) hours as needed for moderate pain.     albuterol (PROVENTIL) (2.5 MG/3ML) 0.083% nebulizer solution Inhale 3 mLs (2.5 mg total) by nebulization every 4 (four) hours as needed for wheezing or shortness of breath. 90 mL 2   albuterol (VENTOLIN HFA) 108 (90 Base) MCG/ACT inhaler Inhale 2 puffs into the lungs every 6 (six) hours as needed for wheezing.     aspirin 81 MG EC tablet Take 81 mg by mouth daily.     b complex vitamins capsule Take 1 capsule by mouth daily.     bisacodyl (DULCOLAX) 5 MG EC tablet Take 1 tablet (5 mg total) by mouth daily at 12 noon. 30 tablet 0   buPROPion (WELLBUTRIN XL) 300 MG 24 hr tablet Take 300 mg by mouth daily.     busPIRone (BUSPAR) 15 MG tablet Take 15  mg by mouth 2 (two) times daily.     calcium-vitamin D (OSCAL WITH D) 500-200 MG-UNIT tablet Take 1 tablet by mouth 2 (two) times daily with a meal.     Cholecalciferol (VITAMIN D-3) 25 MCG (1000 UT) CAPS Take 1,000 Units by mouth daily.     cyclobenzaprine (FLEXERIL) 10 MG tablet Take 20 mg by mouth at bedtime.     diltiazem (CARDIZEM CD) 180 MG 24 hr capsule Take 180 mg by mouth daily.     docusate sodium (COLACE) 100 MG capsule Take 2 capsules (200 mg total) by mouth 2 (two) times daily. 10 capsule 0   DULoxetine (CYMBALTA) 60 MG capsule Take 1 capsule (60 mg total) by mouth 2 (two) times daily. 60 capsule 0   fluticasone (FLONASE) 50 MCG/ACT nasal spray Place 2 sprays into both nostrils daily.      folic acid (FOLVITE) 1 MG tablet Take 1 mg by mouth daily.     gabapentin (NEURONTIN) 300 MG capsule Take 900 mg by mouth at bedtime.     Garlic Oil 3 MG CAPS Take 3 mg by mouth daily.     lisinopril (ZESTRIL) 20 MG tablet Take 20 mg by mouth daily.     lurasidone (LATUDA) 40 MG TABS tablet Take 40 mg by  mouth daily after supper.     Melatonin 3 MG TABS Take 6 mg by mouth at bedtime.     metFORMIN (GLUCOPHAGE) 500 MG tablet Take 500 mg by mouth 2 (two) times daily with a meal.     methotrexate (RHEUMATREX) 2.5 MG tablet Take 6 tablets (15 mg total) by mouth every Saturday. Hold until seen by rheumatology  Caution:Chemotherapy. Protect from light. 4 tablet 0   Multiple Vitamin (MULTIVITAMIN WITH MINERALS) TABS tablet Take 1 tablet by mouth daily.     mycophenolate (CELLCEPT) 250 MG capsule Take 1,500 mg by mouth 2 (two) times daily.     Olodaterol HCl (STRIVERDI RESPIMAT) 2.5 MCG/ACT AERS Inhale 2 puffs into the lungs daily.     OVER THE COUNTER MEDICATION Place 1 drop into both ears daily as needed (itching). Miracell ProEar for irritated itchy ears     OVER THE COUNTER MEDICATION Take 2 tablets by mouth at bedtime. Pure Zzzz     oxcarbazepine (TRILEPTAL) 600 MG tablet Take 600 mg by mouth 3 (three) times daily.     polyethylene glycol (MIRALAX / GLYCOLAX) 17 g packet Take 17 g by mouth daily.     polyvinyl alcohol (LIQUIFILM TEARS) 1.4 % ophthalmic solution Place 1 drop into both eyes 4 (four) times daily as needed for dry eyes.     PRESCRIPTION MEDICATION Inhale into the lungs See admin instructions. CPAP- At bedtime and during during any naps     Propylene Glycol (SYSTANE BALANCE) 0.6 % SOLN Place 1 drop into both eyes in the morning, at noon, and at bedtime.      sodium chloride (OCEAN) 0.65 % SOLN nasal spray Place 2 sprays into both nostrils 4 (four) times daily as needed for congestion.     tamsulosin (FLOMAX) 0.4 MG CAPS capsule Take 0.4 mg by mouth daily.     traMADol (ULTRAM) 50 MG tablet Take 100 mg by mouth at bedtime.     traZODone (DESYREL) 50 MG tablet Take 75 mg by mouth at bedtime.     VITAMIN A PO Take 1 tablet by mouth daily.     vitamin C (ASCORBIC ACID) 500 MG tablet Take 500 mg by mouth  2 (two) times daily.      Certolizumab Pegol 2 X 200 MG/ML PSKT Inject into the skin.  INJECT 400MG  (2 SYRINGES) SUBCUTANEOUSLY EVERY 2 WEEKS FOR 6 WEEKS, THEN INJECT 200MG  (1 SYRINGE) EVERY 2 WEEKS --400 MG ON WEEK 0, 2 AND 4 FOR 3 DOSES ONLY, THEN MAINTENANCE: 200 MG  EVERY 2 WEEKS --400 MG ON WEEK 0, 2 AND 4 FOR 3 DOSES ONLY, THEN MAINTENANCE: 200 MG   EVERY 2 WEEKS (Patient not taking: Reported on 09/21/2020)     feeding supplement (ENSURE ENLIVE / ENSURE PLUS) LIQD Take 237 mLs by mouth 3 (three) times daily between meals. (Patient not taking: Reported on 09/21/2020) 10000 mL 0   nitroGLYCERIN (NITROSTAT) 0.4 MG SL tablet Place 1 tablet (0.4 mg total) under the tongue every 5 (five) minutes as needed for chest pain. (Patient not taking: Reported on 09/21/2020) 12 tablet 12   Tiotropium Bromide-Olodaterol (STIOLTO RESPIMAT) 2.5-2.5 MCG/ACT AERS Inhale 2 puffs into the lungs daily. (Patient not taking: Reported on 09/21/2020)     No current facility-administered medications for this encounter.    REVIEW OF SYSTEMS:  A 10+ POINT REVIEW OF SYSTEMS WAS OBTAINED including neurology, dermatology, psychiatry, cardiac, respiratory, lymph, extremities, GI, GU, musculoskeletal, constitutional, reproductive, HEENT.  He denies any pain within the chest area significant cough or hemoptysis.  He is on continuous oxygen.   PHYSICAL EXAM:  height is 6\' 2"  (1.88 m) and weight is 344 lb (156 kg) (abnormal). His temporal temperature is 97.1 F (36.2 C) (abnormal). His blood pressure is 134/80 and his pulse is 109 (abnormal). His respiration is 22 (abnormal) and oxygen saturation is 95%.   General: Alert and oriented, in no acute distress, remains in wheelchair for evaluation HEENT: Head is normocephalic. Extraocular movements are intact.  Neck: Neck is supple, no palpable cervical or supraclavicular lymphadenopathy. Heart: Regular in rate and rhythm with no murmurs, rubs, or gallops. Chest: Clear to auscultation bilaterally, with no rhonchi, wheezes, or rales. Abdomen: Soft, nontender, nondistended,  with no rigidity or guarding. Extremities: No cyanosis or edema. Lymphatics: see Neck Exam Skin: No concerning lesions. Musculoskeletal: symmetric strength and muscle tone throughout. Neurologic: Cranial nerves II through XII are grossly intact. No obvious focalities. Speech is fluent. Coordination is intact. Psychiatric: Judgment and insight are intact. Affect is appropriate.   ECOG = 2  0 - Asymptomatic (Fully active, able to carry on all predisease activities without restriction)  1 - Symptomatic but completely ambulatory (Restricted in physically strenuous activity but ambulatory and able to carry out work of a light or sedentary nature. For example, light housework, office work)  2 - Symptomatic, <50% in bed during the day (Ambulatory and capable of all self care but unable to carry out any work activities. Up and about more than 50% of waking hours)  3 - Symptomatic, >50% in bed, but not bedbound (Capable of only limited self-care, confined to bed or chair 50% or more of waking hours)  4 - Bedbound (Completely disabled. Cannot carry on any self-care. Totally confined to bed or chair)  5 - Death   Eustace Pen MM, Creech RH, Tormey DC, et al. [phone]). "Toxicity and response criteria of the [facility]". Combine Oncol. 5 (6): 649-55  LABORATORY DATA:  Lab Results  Component Value Date   WBC 11.3 (H) 09/05/2020   HGB 14.3 09/05/2020   HCT 43.2 09/05/2020   MCV 98.2 09/05/2020   PLT 249 09/05/2020   NEUTROABS 5.7 09/03/2020  Lab Results  Component Value Date   NA 135 09/05/2020   K 4.4 09/05/2020   CL 101 09/05/2020   CO2 25 09/05/2020   GLUCOSE 234 (H) 09/05/2020   CREATININE 1.05 09/05/2020   CALCIUM 8.9 09/05/2020      RADIOGRAPHY: CT Angio Chest PE W/Cm &/Or Wo Cm  Result Date: 09/03/2020 CLINICAL DATA:  Shortness of breath, lung cancer, hypoxia. EXAM: CT ANGIOGRAPHY CHEST WITH CONTRAST TECHNIQUE: Multidetector CT imaging of the chest was  performed using the standard protocol during bolus administration of intravenous contrast. Multiplanar CT image reconstructions and MIPs were obtained to evaluate the vascular anatomy. CONTRAST:  48mL OMNIPAQUE IOHEXOL 350 MG/ML SOLN COMPARISON:  Chest radiograph dated 09/03/2020. CT chest dated 07/26/2020. FINDINGS: Cardiovascular: Satisfactory opacification the bilateral pulmonary arteries to the lobar level. Evaluation is also constrained due to respiratory motion. Within that constraint, there is no evidence of pulmonary embolism. Although not tailored for evaluation of the thoracic aorta, there is no evidence of thoracic aortic aneurysm or dissection. Atherosclerotic calcifications descending thoracic aorta. The heart is normal in size.  No pericardial effusion. Coronary atherosclerosis of the LAD and right coronary artery. Mediastinum/Nodes: No suspicious mediastinal lymphadenopathy. Visualized thyroid is unremarkable. Lungs/Pleura: Moderate centrilobular and paraseptal emphysematous changes, upper lung predominant. 4.1 x 3.0 cm left lower lobe nodule (series 7/image 114), poorly visualized due to motion degradation, corresponding to the patient's known primary bronchogenic neoplasm. Small left pleural effusion. No pneumothorax. Upper Abdomen: Visualized upper abdomen is grossly unremarkable. Musculoskeletal: Degenerative changes of the visualized thoracolumbar spine. Review of the MIP images confirms the above findings. IMPRESSION: No evidence of pulmonary embolism. 4.1 cm left lower lobe nodule, corresponding to the patient's known primary bronchogenic neoplasm, poorly visualized due to motion degradation. Small left pleural effusion. Aortic Atherosclerosis (ICD10-I70.0) and Emphysema (ICD10-J43.9). Electronically Signed   By: Julian Hy M.D.   On: 09/03/2020 22:56   MR BRAIN WO CONTRAST  Result Date: 09/21/2020 CLINICAL DATA:  Non-small cell lung cancer metastatic to brain. EXAM: MRI HEAD WITHOUT  CONTRAST TECHNIQUE: Multiplanar, multiecho pulse sequences of the brain and surrounding structures were obtained without intravenous contrast. COMPARISON:  Head CT 07/22/2020 and MRI 12/26/2019 FINDINGS: The study is mildly motion degraded. Brain: There is no evidence of an acute infarct, intracranial hemorrhage, mass, midline shift, or extra-axial fluid collection. T2 hyperintensities in the cerebral white matter bilaterally are unchanged from the prior MRI and are nonspecific but compatible with mild to moderate chronic small vessel ischemic disease. There is mild to moderate cerebral atrophy. Vascular: Major intracranial vascular flow voids are preserved. Skull and upper cervical spine: Unremarkable bone marrow signal. Sinuses/Orbits: Right cataract extraction. Paranasal sinuses and mastoid air cells are clear. Other: None. IMPRESSION: 1. No evidence of intracranial metastases on this unenhanced study. 2. Mild to moderate chronic small vessel ischemic disease and cerebral atrophy. Electronically Signed   By: Logan Bores M.D.   On: 09/21/2020 08:16   NM PET Image Initial (PI) Skull Base To Thigh  Result Date: 09/19/2020 CLINICAL DATA:  Subsequent treatment strategy for left lower lobe lung cancer. EXAM: NUCLEAR MEDICINE PET SKULL BASE TO THIGH TECHNIQUE: 15.8 mCi F-18 FDG was injected intravenously. Full-ring PET imaging was performed from the skull base to thigh after the radiotracer. CT data was obtained and used for attenuation correction and anatomic localization. Fasting blood glucose: 169 mg/dl COMPARISON:  CT chest 09/03/2020 FINDINGS: Mediastinal blood pool activity: SUV max 2.8 Liver activity: SUV max NA NECK: No significant abnormal hypermetabolic activity  in this region. Incidental CT findings: none CHEST: The cavitary left lower lobe process just above the diaphragm has maximum SUV of 2.7. However, there is some localized pleural thickening anteriorly along the left upper lobe measuring about 0.6  cm in thickness with maximum SUV 5.1, concerning for pleural malignancy. A pleural nodule laterally at the left lung base is about 5 mm in thickness, maximum SUV 3.9. Trace left pleural effusion with low-grade activity, maximum SUV about 2.4. A high density left pericardial lymph node measuring 0.5 cm in short axis on image 115 of series 4 has maximum SUV of 4.9. Incidental CT findings: Mild aortic and coronary artery atherosclerotic calcification. Severe centrilobular emphysema. Mild scarring or atelectasis at both lung bases. ABDOMEN/PELVIS: Bowel activity is likely physiologic. Incidental CT findings: Sigmoid colon diverticulosis. Hepatic steatosis. Exophytic hypodense renal lesions are photopenic and likely cysts. Aortoiliac atherosclerotic vascular disease. SKELETON: Unremarkable Incidental CT findings: none IMPRESSION: 1. Small pleural nodules on the left are hypermetabolic and raise concern for early pleural metastatic disease. There is also a small left pleural effusion with low-grade activity. Maximum SUV of these pleural based nodules up to 5.1. 2. Airspace opacity in the left lower lobe at the site of the prior cavitary lung lesion with only low-grade activity (maximum SUV 2.7). 3. High density small left pericardial lymph node has a maximum SUV of 4.9. 4. Other imaging findings of potential clinical significance: Aortic Atherosclerosis (ICD10-I70.0) and Emphysema (ICD10-J43.9). Sigmoid colon diverticulosis. Electronically Signed   By: Van Clines M.D.   On: 09/19/2020 13:44   DG Chest Port 1 View  Result Date: 09/03/2020 CLINICAL DATA:  Shortness of breath recent diagnosis of lung cancer EXAM: PORTABLE CHEST 1 VIEW COMPARISON:  07/30/2020, chest CT 07/26/2020 FINDINGS: Emphysema with bronchitic changes. Ill-defined left lower lobe opacity likely corresponds to patient's CT lung mass. Scarring or atelectasis at the bases. Stable cardiomediastinal silhouette. IMPRESSION: 1. Emphysema with  scarring or atelectasis at the bases 2. Ill-defined left lung base opacity likely corresponds to the CT demonstrated lung mass Electronically Signed   By: Donavan Foil M.D.   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  Options for management would be to proceed with definitive course of radiation therapy directed at the lesion in the left lower lobe and follow the pleural-based lesions.  If the patient is not a candidate for radiosensitizing chemotherapy given his lack of lymph nodal involvement  and comorbidities, then he may be a candidate for hypofractionated accelerated radiation therapy or possibly SBRT.  Patient will meet with Dr. Julien Nordmann tomorrow for medical oncology evaluation.  The patient has significant comorbidities and unsure if he would be a candidate for radiosensitizing chemotherapy.  In addition patient has significant problems with vasculitis as well as uveitis.  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  I recommend the hold this therapy during the course of his radiation treatment.    PLAN: The patient will meet with Dr. Julien Nordmann tomorrow for medical oncology evaluation.  Final treatment plan details are pending input from medical oncology.   60 minutes of total time was spent for this patient encounter, including preparation, face-to-face counseling with the patient and coordination of care, physical exam, and documentation of the encounter.   ------------------------------------------------  Blair Promise, PhD, MD  This document serves as a record of services personally performed by  Gery Pray, MD. It was created on his behalf by Roney Mans, a trained medical scribe. The creation of this record is based on the scribe's personal observations and the provider's statements to them. This document has been checked and approved by  the attending provider.

## 2020-09-20 ENCOUNTER — Ambulatory Visit (HOSPITAL_COMMUNITY)
Admission: RE | Admit: 2020-09-20 | Discharge: 2020-09-20 | Disposition: A | Discharge status: Home / Self Care | Payer: No Typology Code available for payment source | Source: Ambulatory Visit | Attending: Family Medicine | Admitting: Family Medicine

## 2020-09-20 DIAGNOSIS — C3490 Malignant neoplasm of unspecified part of unspecified bronchus or lung: Secondary | ICD-10-CM | POA: Diagnosis not present

## 2020-09-20 DIAGNOSIS — C7931 Secondary malignant neoplasm of brain: Secondary | ICD-10-CM | POA: Insufficient documentation

## 2020-09-21 ENCOUNTER — Other Ambulatory Visit: Payer: Self-pay

## 2020-09-21 ENCOUNTER — Ambulatory Visit
Admission: RE | Admit: 2020-09-21 | Discharge: 2020-09-21 | Disposition: A | Discharge status: Home / Self Care | Payer: No Typology Code available for payment source | Source: Ambulatory Visit | Attending: Radiation Oncology | Admitting: Radiation Oncology

## 2020-09-21 ENCOUNTER — Ambulatory Visit: Payer: No Typology Code available for payment source | Admitting: Radiation Oncology

## 2020-09-21 ENCOUNTER — Encounter: Payer: Self-pay | Admitting: Radiation Oncology

## 2020-09-21 ENCOUNTER — Ambulatory Visit: Payer: No Typology Code available for payment source

## 2020-09-21 VITALS — BP 134/80 | HR 109 | Temp 97.1°F | Resp 22 | Ht 74.0 in | Wt 344.0 lb

## 2020-09-21 DIAGNOSIS — Z87891 Personal history of nicotine dependence: Secondary | ICD-10-CM | POA: Diagnosis not present

## 2020-09-21 DIAGNOSIS — J449 Chronic obstructive pulmonary disease, unspecified: Secondary | ICD-10-CM | POA: Diagnosis not present

## 2020-09-21 DIAGNOSIS — Z7984 Long term (current) use of oral hypoglycemic drugs: Secondary | ICD-10-CM | POA: Diagnosis not present

## 2020-09-21 DIAGNOSIS — K5730 Diverticulosis of large intestine without perforation or abscess without bleeding: Secondary | ICD-10-CM | POA: Diagnosis not present

## 2020-09-21 DIAGNOSIS — I252 Old myocardial infarction: Secondary | ICD-10-CM | POA: Insufficient documentation

## 2020-09-21 DIAGNOSIS — Z923 Personal history of irradiation: Secondary | ICD-10-CM | POA: Diagnosis not present

## 2020-09-21 DIAGNOSIS — G4730 Sleep apnea, unspecified: Secondary | ICD-10-CM | POA: Insufficient documentation

## 2020-09-21 DIAGNOSIS — C3432 Malignant neoplasm of lower lobe, left bronchus or lung: Secondary | ICD-10-CM | POA: Insufficient documentation

## 2020-09-21 DIAGNOSIS — I10 Essential (primary) hypertension: Secondary | ICD-10-CM | POA: Insufficient documentation

## 2020-09-21 DIAGNOSIS — C3492 Malignant neoplasm of unspecified part of left bronchus or lung: Secondary | ICD-10-CM

## 2020-09-21 DIAGNOSIS — I2510 Atherosclerotic heart disease of native coronary artery without angina pectoris: Secondary | ICD-10-CM | POA: Diagnosis not present

## 2020-09-21 DIAGNOSIS — Z79899 Other long term (current) drug therapy: Secondary | ICD-10-CM | POA: Diagnosis not present

## 2020-09-21 DIAGNOSIS — R911 Solitary pulmonary nodule: Secondary | ICD-10-CM

## 2020-09-21 DIAGNOSIS — H209 Unspecified iridocyclitis: Secondary | ICD-10-CM | POA: Insufficient documentation

## 2020-09-21 DIAGNOSIS — R42 Dizziness and giddiness: Secondary | ICD-10-CM | POA: Insufficient documentation

## 2020-09-21 DIAGNOSIS — K760 Fatty (change of) liver, not elsewhere classified: Secondary | ICD-10-CM | POA: Insufficient documentation

## 2020-09-21 DIAGNOSIS — Z7982 Long term (current) use of aspirin: Secondary | ICD-10-CM | POA: Diagnosis not present

## 2020-09-21 DIAGNOSIS — J90 Pleural effusion, not elsewhere classified: Secondary | ICD-10-CM | POA: Insufficient documentation

## 2020-09-21 DIAGNOSIS — J432 Centrilobular emphysema: Secondary | ICD-10-CM | POA: Diagnosis not present

## 2020-09-21 NOTE — Progress Notes (Signed)
See MD note for nursing evaluation. °

## 2020-09-22 ENCOUNTER — Inpatient Hospital Stay: Payer: No Typology Code available for payment source

## 2020-09-22 ENCOUNTER — Inpatient Hospital Stay: Payer: No Typology Code available for payment source | Attending: Internal Medicine | Admitting: Internal Medicine

## 2020-09-22 ENCOUNTER — Encounter: Payer: Self-pay | Admitting: *Deleted

## 2020-09-22 ENCOUNTER — Encounter: Payer: Self-pay | Admitting: Internal Medicine

## 2020-09-22 ENCOUNTER — Encounter: Payer: Self-pay | Admitting: Emergency Medicine

## 2020-09-22 VITALS — BP 109/74 | HR 104 | Temp 97.6°F | Resp 18

## 2020-09-22 DIAGNOSIS — C3490 Malignant neoplasm of unspecified part of unspecified bronchus or lung: Secondary | ICD-10-CM

## 2020-09-22 DIAGNOSIS — R918 Other nonspecific abnormal finding of lung field: Secondary | ICD-10-CM

## 2020-09-22 DIAGNOSIS — Z87891 Personal history of nicotine dependence: Secondary | ICD-10-CM | POA: Diagnosis not present

## 2020-09-22 DIAGNOSIS — J90 Pleural effusion, not elsewhere classified: Secondary | ICD-10-CM | POA: Diagnosis not present

## 2020-09-22 DIAGNOSIS — C3432 Malignant neoplasm of lower lobe, left bronchus or lung: Secondary | ICD-10-CM

## 2020-09-22 DIAGNOSIS — C3492 Malignant neoplasm of unspecified part of left bronchus or lung: Secondary | ICD-10-CM

## 2020-09-22 DIAGNOSIS — I252 Old myocardial infarction: Secondary | ICD-10-CM | POA: Diagnosis not present

## 2020-09-22 LAB — CBC WITH DIFFERENTIAL (CANCER CENTER ONLY)
Abs Immature Granulocytes: 0.03 10*3/uL (ref 0.00–0.07)
Basophils Absolute: 0.1 10*3/uL (ref 0.0–0.1)
Basophils Relative: 1 %
Eosinophils Absolute: 0.1 10*3/uL (ref 0.0–0.5)
Eosinophils Relative: 1 %
HCT: 43 % (ref 39.0–52.0)
Hemoglobin: 14.9 g/dL (ref 13.0–17.0)
Immature Granulocytes: 0 %
Lymphocytes Relative: 34 %
Lymphs Abs: 2.7 10*3/uL (ref 0.7–4.0)
MCH: 33.4 pg (ref 26.0–34.0)
MCHC: 34.7 g/dL (ref 30.0–36.0)
MCV: 96.4 fL (ref 80.0–100.0)
Monocytes Absolute: 0.8 10*3/uL (ref 0.1–1.0)
Monocytes Relative: 10 %
Neutro Abs: 4.1 10*3/uL (ref 1.7–7.7)
Neutrophils Relative %: 54 %
Platelet Count: 281 10*3/uL (ref 150–400)
RBC: 4.46 MIL/uL (ref 4.22–5.81)
RDW: 13.4 % (ref 11.5–15.5)
WBC Count: 7.8 10*3/uL (ref 4.0–10.5)
nRBC: 0 % (ref 0.0–0.2)

## 2020-09-22 LAB — CMP (CANCER CENTER ONLY)
ALT: 78 U/L — ABNORMAL HIGH (ref 0–44)
AST: 56 U/L — ABNORMAL HIGH (ref 15–41)
Albumin: 3.9 g/dL (ref 3.5–5.0)
Alkaline Phosphatase: 54 U/L (ref 38–126)
Anion gap: 11 (ref 5–15)
BUN: 12 mg/dL (ref 8–23)
CO2: 26 mmol/L (ref 22–32)
Calcium: 9 mg/dL (ref 8.9–10.3)
Chloride: 101 mmol/L (ref 98–111)
Creatinine: 1.2 mg/dL (ref 0.61–1.24)
GFR, Estimated: >60 mL/min (ref >60)
Glucose, Bld: 165 mg/dL — ABNORMAL HIGH (ref 70–99)
Potassium: 4.6 mmol/L (ref 3.5–5.1)
Sodium: 138 mmol/L (ref 135–145)
Total Bilirubin: 0.3 mg/dL (ref 0.3–1.2)
Total Protein: 7.1 g/dL (ref 6.5–8.1)

## 2020-09-22 NOTE — Research (Signed)
[id number] - Customer service manager for the Discovery and Validation of Biomarkers for the Prediction, Diagnosis, and Management of Disease  Study Intro:  Patient Mario [name] was identified by this Research officer, political party as a potential candidate for the above listed study.  This Clinical Research Coordinator met with BOSTON COOKSON, [id number], on 09/22/20 in a manner and location that ensures patient privacy to discuss participation in the above listed research study.  Patient is Unaccompanied.  A copy of the informed consent document and separate HIPAA Authorization was provided to the patient.  Patient reads, speaks, and understands [place].   Patient was provided with the business card of this Coordinator and encouraged to contact the research team with any questions.  Approximately 10 minutes were spent with the patient reviewing the informed consent documents.  Patient was provided the option of taking informed consent documents home to review and was encouraged to review at their convenience with their support network, including other care providers. Patient took the consent documents home to review.  Plan:  Will call the patient in a few days to follow up on interest in this study and go over next steps.  Clabe Seal Clinical Research Coordinator I  09/22/20 3:10 PM

## 2020-09-22 NOTE — Progress Notes (Signed)
Oncology Nurse Navigator Documentation  Oncology Nurse Navigator Flowsheets 09/22/2020  Abnormal Finding Date 07/26/2020  Confirmed Diagnosis Date 07/30/2020  Diagnosis Status Pending Molecular Studies  Planned Course of Treatment Radiation  Phase of Treatment Radiation  Navigator Follow Up Date: 09/24/2020  Navigator Follow Up Reason: Appointment Review  Navigator Location [facility]-  Navigator Encounter Type Clinic/MDC;Initial MedOnc  Patient Visit Type Initial;MedOnc  Treatment Phase Pre-Tx/Tx Discussion  Barriers/Navigation Needs Coordination of Care;Education  Education Concerns with Finances/ Eligibility;Newly Diagnosed Cancer Education;Understanding Cancer/ Treatment Options;Other  Interventions Education;Coordination of Care;Psycho-Social Support  Acuity Level 3-Moderate Needs (3-4 Barriers Identified)  Coordination of Care Other;Appts  Education Method Verbal;Written  Time Spent with Patient 75

## 2020-09-22 NOTE — Progress Notes (Signed)
[place] Telephone:([phone]   [phone]) [phone]  CONSULT NOTE  REFERRING PHYSICIAN: Dr. Leory Plowman Icard  REASON FOR CONSULTATION:  70 years old white male recently diagnosed with lung cancer.  HPI FIELD Mario [name] is a 70 y.o. male with past medical history significant for COPD, depression, hypertension, obstructive sleep apnea, borderline diabetes mellitus as well as uveitis and long history of smoking but quit 2018.  The patient mention that he has been complaining of shortness of breath and during his evaluation he had CT scan of the chest on 04/03/2020 and that showed 3.2 x 2.6 cm focus of groundglass opacity at the left lung base suspicious for scarring, atelectasis or infiltrate.  He was followed by observation and repeat CT angiogram of the chest on 07/26/2020 showed chronic opacity with cavitary features at the left costophrenic sulcus laterally measuring up to 5.7 cm mildly increased from 2021.  There was also a 7 mm pulmonary nodule in the left upper lobe that has been stable since 2021.  On 07/30/2020 the patient underwent video bronchoscopy with electromagnetic navigation procedure under the care of Dr. Valeta Harms.  The final pathology ([id number]) showed malignant cells consistent with adenocarcinoma.  The patient was reluctant about treatment.  He had repeat CT angiogram of the chest on 09/03/2020 and it showed 4.1 cm left lower lobe mass corresponding to the patient is known primary bronchogenic neoplasm.  There was also small left pleural effusion.  A PET scan on September 18, 2020 showed small pleural nodules on the left that are hypermetabolic and there is concern for early pleural metastatic disease.  There was also small left pleural effusion with low-grade activity and maximum SUV of these pleural-based nodules up to 5.1.  There was airspace opacity in the left lower lobe at the site of the prior cavitary lung lesion with low-grade activity of 2.7 and high density small  left pericardial lymph node with SUV max of 4.9.  MRI of the brain on 09/20/2020 showed no evidence of metastatic disease to the brain. The patient was referred to me today for evaluation and recommendation regarding his condition. When seen today he continues to complain of shortness of breath at baseline with mild cough and no chest pain or hemoptysis.  He also complains of back pain.  He has constipation and blurry vision.  He denied having any nausea, vomiting, abdominal pain or diarrhea. Family history significant for father died from Alzheimer's and mother died from old age.  He has a sister had lung cancer and brother with throat cancer. The patient is single and has 1 daughter and 1 granddaughter.  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  He has a history of smoking 1 pack/day for around 40 years and quit in 2018.  He has a history of alcohol abuse in the past but recently drinks socially.  He has no history of drug abuse.  HPI  Past Medical History:  Diagnosis Date   Anxiety    Bronchitis    COPD (chronic obstructive pulmonary disease) ([place])    Depression    Hypertension    Mental disorder    MI (myocardial infarction) ([place])    ????   OSA (obstructive sleep apnea)    Suicide attempt [facility])    Tension pneumothorax 06/27/2016   Uveitis     Past Surgical History:  Procedure Laterality Date   BRONCHIAL BIOPSY  07/30/2020   Procedure: BRONCHIAL BIOPSIES;  Surgeon: June Leap  L, DO;  Location: [place] [facility];  Service: Pulmonary;;   BRONCHIAL BRUSHINGS  07/30/2020   Procedure: BRONCHIAL BRUSHINGS;  Surgeon: Garner Nash, DO;  Location: [place] [facility];  Service: Pulmonary;;   BRONCHIAL NEEDLE ASPIRATION BIOPSY  07/30/2020   Procedure: BRONCHIAL NEEDLE ASPIRATION BIOPSIES;  Surgeon: Garner Nash, DO;  Location: [place];  Service: Pulmonary;;   BRONCHIAL WASHINGS  07/30/2020   Procedure: BRONCHIAL WASHINGS;  Surgeon: Garner Nash, DO;  Location: [place]  ENDOSCOPY;  Service: Pulmonary;;   CHEST TUBE INSERTION Left 06/27/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     SKIN CANCER EXCISION     VIDEO BRONCHOSCOPY WITH ENDOBRONCHIAL NAVIGATION Left 07/30/2020   Procedure: VIDEO BRONCHOSCOPY WITH ENDOBRONCHIAL NAVIGATION;  Surgeon: Garner Nash, DO;  Location: [place];  Service: Pulmonary;  Laterality: Left;    Family History  Problem Relation Age of Onset   Dementia Father     Social History Social History   Tobacco Use   Smoking status: Former    Packs/day: 1.00    Years: 35.00    Pack years: 35.00    Types: Cigarettes    Quit date: 05/2016    Years since quitting: 4.3   Smokeless tobacco: Never  Vaping Use   Vaping Use: Never used  Substance Use Topics   Alcohol use: No    Alcohol/week: 0.0 standard drinks    Comment: denies use of any drugs or alcohol   Drug use: No    Allergies  Allergen Reactions   Demerol [Meperidine] Nausea And Vomiting and Other (See Comments)    Made the patient "violently sick"   Zocor [Simvastatin] Nausea And Vomiting and Other (See Comments)    Made him very jittery, also    Current Outpatient Medications  Medication Sig Dispense Refill   acetaminophen (TYLENOL) 500 MG tablet Take 1,000 mg by mouth every 6 (six) hours as needed for moderate pain.     albuterol (PROVENTIL) (2.5 MG/3ML) 0.083% nebulizer solution Inhale 3 mLs (2.5 mg total) by nebulization every 4 (four) hours as needed for wheezing or shortness of breath. 90 mL 2   albuterol (VENTOLIN HFA) 108 (90 Base) MCG/ACT inhaler Inhale 2 puffs into the lungs every 6 (six) hours as needed for wheezing.     aspirin 81 MG EC tablet Take 81 mg by mouth daily.     b complex vitamins capsule Take 1 capsule by mouth daily.     bisacodyl (DULCOLAX) 5 MG EC tablet Take 1 tablet (5 mg total) by mouth daily at 12 noon. 30 tablet 0   buPROPion (WELLBUTRIN XL) 300 MG 24 hr tablet Take 300 mg by mouth daily.     busPIRone (BUSPAR) 15 MG tablet Take 15 mg by mouth 2  (two) times daily.     calcium-vitamin D (OSCAL WITH D) 500-200 MG-UNIT tablet Take 1 tablet by mouth 2 (two) times daily with a meal.     Cholecalciferol (VITAMIN D-3) 25 MCG (1000 UT) CAPS Take 1,000 Units by mouth daily.     cyclobenzaprine (FLEXERIL) 10 MG tablet Take 20 mg by mouth at bedtime.     diltiazem (CARDIZEM CD) 180 MG 24 hr capsule Take 180 mg by mouth daily.     docusate sodium (COLACE) 100 MG capsule Take 2 capsules (200 mg total) by mouth 2 (two) times daily. 10 capsule 0   DULoxetine (CYMBALTA) 60 MG capsule Take 1 capsule (60 mg total) by mouth 2 (two) times daily. 60 capsule 0   fluticasone (  FLONASE) 50 MCG/ACT nasal spray Place 2 sprays into both nostrils daily.      folic acid (FOLVITE) 1 MG tablet Take 1 mg by mouth daily.     gabapentin (NEURONTIN) 300 MG capsule Take 900 mg by mouth at bedtime.     Garlic Oil 3 MG CAPS Take 3 mg by mouth daily.     lisinopril (ZESTRIL) 20 MG tablet Take 20 mg by mouth daily.     lurasidone (LATUDA) 40 MG TABS tablet Take 40 mg by mouth daily after supper.     Melatonin 3 MG TABS Take 6 mg by mouth at bedtime.     metFORMIN (GLUCOPHAGE) 500 MG tablet Take 500 mg by mouth 2 (two) times daily with a meal.     Multiple Vitamin (MULTIVITAMIN WITH MINERALS) TABS tablet Take 1 tablet by mouth daily.     mycophenolate (CELLCEPT) 250 MG capsule Take 1,500 mg by mouth 2 (two) times daily.     nitroGLYCERIN (NITROSTAT) 0.4 MG SL tablet Place 1 tablet (0.4 mg total) under the tongue every 5 (five) minutes as needed for chest pain. 12 tablet 12   Olodaterol HCl (STRIVERDI RESPIMAT) 2.5 MCG/ACT AERS Inhale 2 puffs into the lungs daily.     OVER THE COUNTER MEDICATION Place 1 drop into both ears daily as needed (itching). Miracell ProEar for irritated itchy ears     OVER THE COUNTER MEDICATION Take 2 tablets by mouth at bedtime. Pure Zzzz     oxcarbazepine (TRILEPTAL) 600 MG tablet Take 600 mg by mouth 3 (three) times daily.     polyethylene glycol  (MIRALAX / GLYCOLAX) 17 g packet Take 17 g by mouth daily.     polyvinyl alcohol (LIQUIFILM TEARS) 1.4 % ophthalmic solution Place 1 drop into both eyes 4 (four) times daily as needed for dry eyes.     PRESCRIPTION MEDICATION Inhale into the lungs See admin instructions. CPAP- At bedtime and during during any naps     Propylene Glycol (SYSTANE BALANCE) 0.6 % SOLN Place 1 drop into both eyes in the morning, at noon, and at bedtime.      sodium chloride (OCEAN) 0.65 % SOLN nasal spray Place 2 sprays into both nostrils 4 (four) times daily as needed for congestion.     tamsulosin (FLOMAX) 0.4 MG CAPS capsule Take 0.4 mg by mouth daily.     traMADol (ULTRAM) 50 MG tablet Take 100 mg by mouth at bedtime.     traZODone (DESYREL) 50 MG tablet Take 75 mg by mouth at bedtime.     VITAMIN A PO Take 1 tablet by mouth daily.     vitamin C (ASCORBIC ACID) 500 MG tablet Take 500 mg by mouth 2 (two) times daily.      Certolizumab Pegol 2 X 200 MG/ML PSKT Inject into the skin. INJECT 400MG  (2 SYRINGES) SUBCUTANEOUSLY EVERY 2 WEEKS FOR 6 WEEKS, THEN INJECT 200MG  (1 SYRINGE) EVERY 2 WEEKS --400 MG ON WEEK 0, 2 AND 4 FOR 3 DOSES ONLY, THEN MAINTENANCE: 200 MG  EVERY 2 WEEKS --400 MG ON WEEK 0, 2 AND 4 FOR 3 DOSES ONLY, THEN MAINTENANCE: 200 MG   EVERY 2 WEEKS (Patient not taking: No sig reported)     feeding supplement (ENSURE ENLIVE / ENSURE PLUS) LIQD Take 237 mLs by mouth 3 (three) times daily between meals. (Patient not taking: No sig reported) 10000 mL 0   No current facility-administered medications for this visit.    Review of Systems  Constitutional: positive for fatigue Eyes: negative Ears, nose, mouth, throat, and face: negative Respiratory: positive for cough and dyspnea on exertion Cardiovascular: negative Gastrointestinal: positive for constipation Genitourinary:negative Integument/breast: negative Hematologic/lymphatic: negative Musculoskeletal:negative Neurological:  negative Behavioral/Psych: negative Endocrine: negative Allergic/Immunologic: negative  Physical Exam  IRS:WNIOE, healthy, no distress, well nourished, and well developed SKIN: skin color, texture, turgor are normal, no rashes or significant lesions HEAD: Normocephalic, No masses, lesions, tenderness or abnormalities EYES: normal, PERRLA, Conjunctiva are pink and non-injected EARS: External ears normal, Canals clear OROPHARYNX:no exudate, no erythema, and lips, buccal mucosa, and tongue normal  NECK: supple, no adenopathy, no JVD LYMPH:  no palpable lymphadenopathy, no hepatosplenomegaly LUNGS: clear to auscultation , and palpation HEART: regular rate & rhythm, no murmurs, and no gallops ABDOMEN:abdomen soft, non-tender, obese, normal bowel sounds, and no masses or organomegaly BACK: No CVA tenderness, Range of motion is normal EXTREMITIES:no joint deformities, effusion, or inflammation, no edema  NEURO: alert & oriented x 3 with fluent speech, no focal motor/sensory deficits  PERFORMANCE STATUS: ECOG 1  LABORATORY DATA: Lab Results  Component Value Date   WBC 7.8 09/22/2020   HGB 14.9 09/22/2020   HCT 43.0 09/22/2020   MCV 96.4 09/22/2020   PLT 281 09/22/2020      Chemistry      Component Value Date/Time   NA 138 09/22/2020 1330   K 4.6 09/22/2020 1330   CL 101 09/22/2020 1330   CO2 26 09/22/2020 1330   BUN 12 09/22/2020 1330   CREATININE 1.20 09/22/2020 1330      Component Value Date/Time   CALCIUM 9.0 09/22/2020 1330   ALKPHOS 54 09/22/2020 1330   AST 56 (H) 09/22/2020 1330   ALT 78 (H) 09/22/2020 1330   BILITOT 0.3 09/22/2020 1330       RADIOGRAPHIC STUDIES: CT Angio Chest PE W/Cm &/Or Wo Cm  Result Date: 09/03/2020 CLINICAL DATA:  Shortness of breath, lung cancer, hypoxia. EXAM: CT ANGIOGRAPHY CHEST WITH CONTRAST TECHNIQUE: Multidetector CT imaging of the chest was performed using the standard protocol during bolus administration of intravenous contrast.  Multiplanar CT image reconstructions and MIPs were obtained to evaluate the vascular anatomy. CONTRAST:  86mL OMNIPAQUE IOHEXOL 350 MG/ML SOLN COMPARISON:  Chest radiograph dated 09/03/2020. CT chest dated 07/26/2020. FINDINGS: Cardiovascular: Satisfactory opacification the bilateral pulmonary arteries to the lobar level. Evaluation is also constrained due to respiratory motion. Within that constraint, there is no evidence of pulmonary embolism. Although not tailored for evaluation of the thoracic aorta, there is no evidence of thoracic aortic aneurysm or dissection. Atherosclerotic calcifications descending thoracic aorta. The heart is normal in size.  No pericardial effusion. Coronary atherosclerosis of the LAD and right coronary artery. Mediastinum/Nodes: No suspicious mediastinal lymphadenopathy. Visualized thyroid is unremarkable. Lungs/Pleura: Moderate centrilobular and paraseptal emphysematous changes, upper lung predominant. 4.1 x 3.0 cm left lower lobe nodule (series 7/image 114), poorly visualized due to motion degradation, corresponding to the patient's known primary bronchogenic neoplasm. Small left pleural effusion. No pneumothorax. Upper Abdomen: Visualized upper abdomen is grossly unremarkable. Musculoskeletal: Degenerative changes of the visualized thoracolumbar spine. Review of the MIP images confirms the above findings. IMPRESSION: No evidence of pulmonary embolism. 4.1 cm left lower lobe nodule, corresponding to the patient's known primary bronchogenic neoplasm, poorly visualized due to motion degradation. Small left pleural effusion. Aortic Atherosclerosis (ICD10-I70.0) and Emphysema (ICD10-J43.9). Electronically Signed   By: Julian Hy M.D.   On: 09/03/2020 22:56   MR BRAIN WO CONTRAST  Result Date: 09/21/2020 CLINICAL DATA:  Non-small cell lung cancer metastatic to brain. EXAM: MRI HEAD WITHOUT CONTRAST TECHNIQUE: Multiplanar, multiecho pulse sequences of the brain and surrounding  structures were obtained without intravenous contrast. COMPARISON:  Head CT 07/22/2020 and MRI 12/26/2019 FINDINGS: The study is mildly motion degraded. Brain: There is no evidence of an acute infarct, intracranial hemorrhage, mass, midline shift, or extra-axial fluid collection. T2 hyperintensities in the cerebral white matter bilaterally are unchanged from the prior MRI and are nonspecific but compatible with mild to moderate chronic small vessel ischemic disease. There is mild to moderate cerebral atrophy. Vascular: Major intracranial vascular flow voids are preserved. Skull and upper cervical spine: Unremarkable bone marrow signal. Sinuses/Orbits: Right cataract extraction. Paranasal sinuses and mastoid air cells are clear. Other: None. IMPRESSION: 1. No evidence of intracranial metastases on this unenhanced study. 2. Mild to moderate chronic small vessel ischemic disease and cerebral atrophy. Electronically Signed   By: Logan Bores M.D.   On: 09/21/2020 08:16   NM PET Image Initial (PI) Skull Base To Thigh  Result Date: 09/19/2020 CLINICAL DATA:  Subsequent treatment strategy for left lower lobe lung cancer. EXAM: NUCLEAR MEDICINE PET SKULL BASE TO THIGH TECHNIQUE: 15.8 mCi F-18 FDG was injected intravenously. Full-ring PET imaging was performed from the skull base to thigh after the radiotracer. CT data was obtained and used for attenuation correction and anatomic localization. Fasting blood glucose: 169 mg/dl COMPARISON:  CT chest 09/03/2020 FINDINGS: Mediastinal blood pool activity: SUV max 2.8 Liver activity: SUV max NA NECK: No significant abnormal hypermetabolic activity in this region. Incidental CT findings: none CHEST: The cavitary left lower lobe process just above the diaphragm has maximum SUV of 2.7. However, there is some localized pleural thickening anteriorly along the left upper lobe measuring about 0.6 cm in thickness with maximum SUV 5.1, concerning for pleural malignancy. A pleural  nodule laterally at the left lung base is about 5 mm in thickness, maximum SUV 3.9. Trace left pleural effusion with low-grade activity, maximum SUV about 2.4. A high density left pericardial lymph node measuring 0.5 cm in short axis on image 115 of series 4 has maximum SUV of 4.9. Incidental CT findings: Mild aortic and coronary artery atherosclerotic calcification. Severe centrilobular emphysema. Mild scarring or atelectasis at both lung bases. ABDOMEN/PELVIS: Bowel activity is likely physiologic. Incidental CT findings: Sigmoid colon diverticulosis. Hepatic steatosis. Exophytic hypodense renal lesions are photopenic and likely cysts. Aortoiliac atherosclerotic vascular disease. SKELETON: Unremarkable Incidental CT findings: none IMPRESSION: 1. Small pleural nodules on the left are hypermetabolic and raise concern for early pleural metastatic disease. There is also a small left pleural effusion with low-grade activity. Maximum SUV of these pleural based nodules up to 5.1. 2. Airspace opacity in the left lower lobe at the site of the prior cavitary lung lesion with only low-grade activity (maximum SUV 2.7). 3. High density small left pericardial lymph node has a maximum SUV of 4.9. 4. Other imaging findings of potential clinical significance: Aortic Atherosclerosis (ICD10-I70.0) and Emphysema (ICD10-J43.9). Sigmoid colon diverticulosis. Electronically Signed   By: Van Clines M.D.   On: 09/19/2020 13:44   DG Chest Port 1 View  Result Date: 09/03/2020 CLINICAL DATA:  Shortness of breath recent diagnosis of lung cancer EXAM: PORTABLE CHEST 1 VIEW COMPARISON:  07/30/2020, chest CT 07/26/2020 FINDINGS: Emphysema with bronchitic changes. Ill-defined left lower lobe opacity likely corresponds to patient's CT lung mass. Scarring or atelectasis at the bases. Stable cardiomediastinal silhouette. IMPRESSION: 1. Emphysema with scarring or atelectasis at the bases 2. Ill-defined  left lung base opacity likely  corresponds to the CT demonstrated lung mass Electronically Signed   By: Donavan Foil M.D.   On: 09/03/2020 21:36    ASSESSMENT: This is a very pleasant 70 years old white male recently diagnosed with at least stage IIb (T3, N0, M0) non-small cell lung cancer, adenocarcinoma presented with left lower lobe lung mass diagnosed in June 2022.  The patient also has suspicious pleural-based nodule and small left pleural effusion on the recent PET scan that could be concerning for metastatic disease but not completely confirmed at this point.   PLAN: I had a lengthy discussion with the patient today about his current disease stage, prognosis and treatment options. I personally and independently reviewed the scan images and discussed the results and showed the images to the patient. In the absence of clear evidence for the metastatic disease in the left lung or the pleural effusion, I recommended for the patient to proceed with SBRT to the left lower lobe lung mass with curative intention.  He will need to be monitored very closely for any possibility of disease progression on the pleural space or the pleural fluid. I will send blood sample to [facility] for molecular studies with tissue next of the blood sample is negative for actionable mutation. I will arrange for the patient to come back for follow-up visit in around 4 months with repeat CT scan of the chest for restaging of his disease. If the next scan showed any concerning findings for disease progression, I will discuss with the patient the option of palliative systemic chemotherapy versus palliative care. The patient is in agreement with the current plan. He was advised to call immediately if he has any other concerning symptoms in the interval. The patient voices understanding of current disease status and treatment options and is in agreement with the current care plan.  All questions were answered. The patient knows to call the clinic with any  problems, questions or concerns. We can certainly see the patient much sooner if necessary.  Thank you so much for allowing me to participate in the care of Mario [name]. I will continue to follow up the patient with you and assist in his care.  The total time spent in the appointment was 60 minutes.  Disclaimer: This note was dictated with voice recognition software. Similar sounding words can inadvertently be transcribed and may not be corrected upon review.   Eilleen Kempf September 22, 2020, 2:35 PM

## 2020-09-23 ENCOUNTER — Encounter: Payer: Self-pay | Admitting: General Practice

## 2020-09-23 NOTE — Progress Notes (Signed)
[place] Psychosocial Distress Screening Clinical Social Work  Clinical Social Work was referred by distress screening protocol.  The patient scored a 7 on the Psychosocial Distress Thermometer which indicates moderate distress. Clinical Social Worker contacted patient by phone to assess for distress and other psychosocial needs. He has spoken w oncologists, is optimistic about his situation.  He starts radiation treatment tomorrow.  He states that he has no current concerns, no needs.  [facility] and [facility] of care.  He is using [employer], has already gotten rides from this service.  Briefly described Mario [name] and encouraged him to reach out as needed.    ONCBCN DISTRESS SCREENING 09/21/2020  Screening Type Initial Screening  Distress experienced in past week (1-10) 7  Practical problem type Transportation  Family Problem type Other (comment)  Emotional problem type Depression;Nervousness/Anxiety;Adjusting to illness;Isolation/feeling alone;Boredom;Adjusting to appearance changes  Spiritual/Religous concerns type Relating to God;Loss of Faith;Loss of sense of purpose  Information Concerns Type Lack of info about diagnosis;Lack of info about treatment  Physical Problem type Pain;Sleep/insomnia;Getting around;Breathing;Constipation/diarrhea  Referral to clinical social work Yes    Clinical Social Worker follow up needed: No.  If yes, follow up plan:  Beverely Pace, [place], LCSW Clinical Social Worker Phone:  [phone]

## 2020-09-24 ENCOUNTER — Ambulatory Visit
Admission: RE | Admit: 2020-09-24 | Discharge: 2020-09-24 | Disposition: A | Discharge status: Home / Self Care | Payer: No Typology Code available for payment source | Source: Ambulatory Visit | Attending: Radiation Oncology | Admitting: Radiation Oncology

## 2020-09-24 ENCOUNTER — Other Ambulatory Visit: Payer: Self-pay

## 2020-09-24 DIAGNOSIS — C3492 Malignant neoplasm of unspecified part of left bronchus or lung: Secondary | ICD-10-CM

## 2020-09-24 DIAGNOSIS — C3432 Malignant neoplasm of lower lobe, left bronchus or lung: Secondary | ICD-10-CM | POA: Diagnosis present

## 2020-09-25 ENCOUNTER — Telehealth: Payer: Self-pay | Admitting: Emergency Medicine

## 2020-09-25 NOTE — Telephone Encounter (Addendum)
[id number] - Customer service manager for the Discovery and Validation of Biomarkers for the Prediction, Diagnosis, and Management of Disease  3:40PM: Called to follow up concerning this research study.  Patient did not answer, left voicemail requesting return call.  4:00PM:  Patient returned call.  Patient does not have routine lab appointment scheduled prior to radiation treatment.  Discussed options with patient for research participation. Decided to not schedule extra lab appointment only for research prior to start of radiation treatment.  The patient was thanked for his time and consideration of this study.  Clabe Seal Clinical Research Coordinator I  09/25/20  3:41 PM

## 2020-09-28 DIAGNOSIS — C801 Malignant (primary) neoplasm, unspecified: Secondary | ICD-10-CM

## 2020-09-28 HISTORY — DX: Malignant (primary) neoplasm, unspecified: C80.1

## 2020-09-30 DIAGNOSIS — C3432 Malignant neoplasm of lower lobe, left bronchus or lung: Secondary | ICD-10-CM | POA: Diagnosis not present

## 2020-09-30 DIAGNOSIS — Z510 Encounter for antineoplastic radiation therapy: Secondary | ICD-10-CM | POA: Diagnosis not present

## 2020-10-05 ENCOUNTER — Other Ambulatory Visit: Payer: Self-pay

## 2020-10-05 ENCOUNTER — Ambulatory Visit
Admission: RE | Admit: 2020-10-05 | Discharge: 2020-10-05 | Disposition: A | Discharge status: Home / Self Care | Payer: No Typology Code available for payment source | Source: Ambulatory Visit | Attending: Radiation Oncology | Admitting: Radiation Oncology

## 2020-10-05 DIAGNOSIS — Z510 Encounter for antineoplastic radiation therapy: Secondary | ICD-10-CM | POA: Diagnosis not present

## 2020-10-06 ENCOUNTER — Ambulatory Visit: Payer: No Typology Code available for payment source | Admitting: Radiation Oncology

## 2020-10-07 ENCOUNTER — Other Ambulatory Visit: Payer: Self-pay

## 2020-10-07 ENCOUNTER — Ambulatory Visit
Admission: RE | Admit: 2020-10-07 | Discharge: 2020-10-07 | Disposition: A | Discharge status: Home / Self Care | Payer: No Typology Code available for payment source | Source: Ambulatory Visit | Attending: Radiation Oncology | Admitting: Radiation Oncology

## 2020-10-07 DIAGNOSIS — Z510 Encounter for antineoplastic radiation therapy: Secondary | ICD-10-CM | POA: Diagnosis not present

## 2020-10-09 ENCOUNTER — Other Ambulatory Visit: Payer: Self-pay

## 2020-10-09 ENCOUNTER — Ambulatory Visit
Admission: RE | Admit: 2020-10-09 | Discharge: 2020-10-09 | Disposition: A | Discharge status: Home / Self Care | Payer: No Typology Code available for payment source | Source: Ambulatory Visit | Attending: Radiation Oncology | Admitting: Radiation Oncology

## 2020-10-09 DIAGNOSIS — Z510 Encounter for antineoplastic radiation therapy: Secondary | ICD-10-CM | POA: Diagnosis not present

## 2020-10-12 ENCOUNTER — Ambulatory Visit: Payer: No Typology Code available for payment source | Admitting: Radiation Oncology

## 2020-10-13 ENCOUNTER — Other Ambulatory Visit: Payer: Self-pay

## 2020-10-13 ENCOUNTER — Ambulatory Visit
Admission: RE | Admit: 2020-10-13 | Discharge: 2020-10-13 | Disposition: A | Discharge status: Home / Self Care | Payer: No Typology Code available for payment source | Source: Ambulatory Visit | Attending: Radiation Oncology | Admitting: Radiation Oncology

## 2020-10-13 DIAGNOSIS — C3492 Malignant neoplasm of unspecified part of left bronchus or lung: Secondary | ICD-10-CM

## 2020-10-13 DIAGNOSIS — Z510 Encounter for antineoplastic radiation therapy: Secondary | ICD-10-CM | POA: Diagnosis not present

## 2020-10-15 ENCOUNTER — Other Ambulatory Visit: Payer: Self-pay

## 2020-10-15 ENCOUNTER — Encounter: Payer: Self-pay | Admitting: Radiation Oncology

## 2020-10-15 ENCOUNTER — Ambulatory Visit
Admission: RE | Admit: 2020-10-15 | Discharge: 2020-10-15 | Disposition: A | Discharge status: Home / Self Care | Payer: No Typology Code available for payment source | Source: Ambulatory Visit | Attending: Radiation Oncology | Admitting: Radiation Oncology

## 2020-10-15 DIAGNOSIS — Z510 Encounter for antineoplastic radiation therapy: Secondary | ICD-10-CM | POA: Diagnosis not present

## 2020-10-15 DIAGNOSIS — C3492 Malignant neoplasm of unspecified part of left bronchus or lung: Secondary | ICD-10-CM

## 2020-10-15 LAB — GUARDANT 360

## 2020-11-08 ENCOUNTER — Other Ambulatory Visit: Payer: Self-pay

## 2020-11-08 ENCOUNTER — Emergency Department (HOSPITAL_COMMUNITY)
Admission: EM | Admit: 2020-11-08 | Discharge: 2020-11-08 | Disposition: A | Discharge status: Home / Self Care | Payer: No Typology Code available for payment source | Attending: Emergency Medicine | Admitting: Emergency Medicine

## 2020-11-08 ENCOUNTER — Emergency Department (HOSPITAL_COMMUNITY): Payer: No Typology Code available for payment source

## 2020-11-08 ENCOUNTER — Encounter (HOSPITAL_COMMUNITY): Payer: Self-pay

## 2020-11-08 DIAGNOSIS — B349 Viral infection, unspecified: Secondary | ICD-10-CM | POA: Diagnosis not present

## 2020-11-08 DIAGNOSIS — Z20822 Contact with and (suspected) exposure to covid-19: Secondary | ICD-10-CM | POA: Insufficient documentation

## 2020-11-08 DIAGNOSIS — I129 Hypertensive chronic kidney disease with stage 1 through stage 4 chronic kidney disease, or unspecified chronic kidney disease: Secondary | ICD-10-CM | POA: Diagnosis not present

## 2020-11-08 DIAGNOSIS — Z7982 Long term (current) use of aspirin: Secondary | ICD-10-CM | POA: Diagnosis not present

## 2020-11-08 DIAGNOSIS — Z87891 Personal history of nicotine dependence: Secondary | ICD-10-CM | POA: Diagnosis not present

## 2020-11-08 DIAGNOSIS — N1831 Chronic kidney disease, stage 3a: Secondary | ICD-10-CM | POA: Insufficient documentation

## 2020-11-08 DIAGNOSIS — R531 Weakness: Secondary | ICD-10-CM | POA: Diagnosis present

## 2020-11-08 DIAGNOSIS — J449 Chronic obstructive pulmonary disease, unspecified: Secondary | ICD-10-CM | POA: Diagnosis not present

## 2020-11-08 LAB — CBC WITH DIFFERENTIAL/PLATELET
Abs Immature Granulocytes: 0.04 10*3/uL (ref 0.00–0.07)
Basophils Absolute: 0.1 10*3/uL (ref 0.0–0.1)
Basophils Relative: 1 %
Eosinophils Absolute: 0.1 10*3/uL (ref 0.0–0.5)
Eosinophils Relative: 1 %
HCT: 46 % (ref 39.0–52.0)
Hemoglobin: 15.8 g/dL (ref 13.0–17.0)
Immature Granulocytes: 0 %
Lymphocytes Relative: 14 %
Lymphs Abs: 1.3 10*3/uL (ref 0.7–4.0)
MCH: 33.1 pg (ref 26.0–34.0)
MCHC: 34.3 g/dL (ref 30.0–36.0)
MCV: 96.4 fL (ref 80.0–100.0)
Monocytes Absolute: 1 10*3/uL (ref 0.1–1.0)
Monocytes Relative: 11 %
Neutro Abs: 6.5 10*3/uL (ref 1.7–7.7)
Neutrophils Relative %: 73 %
Platelets: 296 10*3/uL (ref 150–400)
RBC: 4.77 MIL/uL (ref 4.22–5.81)
RDW: 13.2 % (ref 11.5–15.5)
WBC: 8.9 10*3/uL (ref 4.0–10.5)
nRBC: 0 % (ref 0.0–0.2)

## 2020-11-08 LAB — BLOOD GAS, VENOUS
Acid-Base Excess: 1 mmol/L (ref 0.0–2.0)
Bicarbonate: 25.4 mmol/L (ref 20.0–28.0)
O2 Saturation: 56.9 %
Patient temperature: 98.6
pCO2, Ven: 41.9 mmHg — ABNORMAL LOW (ref 44.0–60.0)
pH, Ven: 7.401 (ref 7.250–7.430)
pO2, Ven: 33.6 mmHg (ref 32.0–45.0)

## 2020-11-08 LAB — RESP PANEL BY RT-PCR (FLU A&B, COVID) ARPGX2
Influenza A by PCR: NEGATIVE
Influenza B by PCR: NEGATIVE
SARS Coronavirus 2 by RT PCR: NEGATIVE

## 2020-11-08 LAB — COMPREHENSIVE METABOLIC PANEL
ALT: 49 U/L — ABNORMAL HIGH (ref 0–44)
AST: 46 U/L — ABNORMAL HIGH (ref 15–41)
Albumin: 4.5 g/dL (ref 3.5–5.0)
Alkaline Phosphatase: 53 U/L (ref 38–126)
Anion gap: 14 (ref 5–15)
BUN: 15 mg/dL (ref 8–23)
CO2: 24 mmol/L (ref 22–32)
Calcium: 9.1 mg/dL (ref 8.9–10.3)
Chloride: 95 mmol/L — ABNORMAL LOW (ref 98–111)
Creatinine, Ser: 0.93 mg/dL (ref 0.61–1.24)
GFR, Estimated: >60 mL/min (ref >60)
Glucose, Bld: 140 mg/dL — ABNORMAL HIGH (ref 70–99)
Potassium: 4.6 mmol/L (ref 3.5–5.1)
Sodium: 133 mmol/L — ABNORMAL LOW (ref 135–145)
Total Bilirubin: 0.4 mg/dL (ref 0.3–1.2)
Total Protein: 7.2 g/dL (ref 6.5–8.1)

## 2020-11-08 LAB — TROPONIN I (HIGH SENSITIVITY): Troponin I (High Sensitivity): 5 ng/L (ref <18)

## 2020-11-08 MED ORDER — METHYLPREDNISOLONE SODIUM SUCC 125 MG IJ SOLR
125.0000 mg | Freq: Once | INTRAMUSCULAR | Status: AC
  Administered 2020-11-08: 125 mg via INTRAVENOUS
  Filled 2020-11-08: qty 2

## 2020-11-08 MED ORDER — ALBUTEROL SULFATE HFA 108 (90 BASE) MCG/ACT IN AERS
2.0000 | INHALATION_SPRAY | Freq: Once | RESPIRATORY_TRACT | Status: AC
  Administered 2020-11-08: 2 via RESPIRATORY_TRACT
  Filled 2020-11-08: qty 6.7

## 2020-11-08 NOTE — ED Provider Notes (Signed)
[facility] DEPT Provider Note   CSN: [id number] Arrival date & time: 11/08/20  1722     History Chief Complaint  Patient presents with   Weakness    SAN LOHMEYER is a 70 y.o. male  with pertinent PMH of COPD, HTN, anxiety/depression, DM2, and NSCLC presenting with "whole body weakness making me feel shaky" since 2 pm today. He also complains of DOE which he has at baseline, with no associated cough or sputum production. He uses 3L [employer] O2 supplementation with exertion, at baseline. He denies fevers, chills, N/V/D, LOC, syncope, head trauma, falls, abdominal pain, chest pain, palpitations, and leg swelling. He denies any recent sick contacts.    Weakness Associated symptoms: myalgias and shortness of breath   Associated symptoms: no abdominal pain, no arthralgias, no chest pain, no cough, no diarrhea, no dysuria, no fever, no headaches and no vomiting       Past Medical History:  Diagnosis Date   Anxiety    Bronchitis    COPD (chronic obstructive pulmonary disease) ([place])    Depression    Hypertension    Mental disorder    MI (myocardial infarction) ([place])    ????   OSA (obstructive sleep apnea)    Suicide attempt [facility])    Tension pneumothorax 06/27/2016   Uveitis     Patient Active Problem List   Diagnosis Date Noted   Non-small cell cancer of left lung ([place]) 09/21/2020   Lung nodule 07/30/2020   Mass of left lung    Acute respiratory failure with hypoxemia ([place]) 07/26/2020   Constipation    CAP (community acquired pneumonia) 04/03/2020   COPD with acute exacerbation ([place]) 01/05/2020   Vertigo 12/26/2019   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   Stage 3a chronic kidney disease ([place]) 12/26/2019   Abnormal CT of the chest 12/26/2019   COPD (chronic obstructive pulmonary disease) ([place])    AKI (acute kidney injury) ([place])    Hyperglycemia 06/23/2019   OSA (obstructive sleep apnea) 06/23/2019   Suicidal ideation 06/23/2019   Major depressive  disorder, recurrent episode ([place]) 10/12/2018   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   Cellulitis of both feet 07/16/2017   DM2 (diabetes mellitus, type 2) ([place]) 07/16/2017   HLA B27 (HLA B27 positive) 07/16/2017   Acute chest pain    Hyperkalemia    Spontaneous pneumothorax 06/27/2016   Hypoxia    Pneumothorax on left    Dyspnea 05/23/2014   COPD exacerbation ([place]) 05/23/2014   Malingering 01/21/2013   Depression 01/11/2013   Tobacco abuse 01/11/2013   Obesity, unspecified 01/11/2013   Chest pain 01/10/2013   HTN (hypertension) 01/10/2013    Past Surgical History:  Procedure Laterality Date   BRONCHIAL BIOPSY  07/30/2020   Procedure: BRONCHIAL BIOPSIES;  Surgeon: Garner Nash, DO;  Location: [facility];  Service: Pulmonary;;   BRONCHIAL BRUSHINGS  07/30/2020   Procedure: BRONCHIAL BRUSHINGS;  Surgeon: Garner Nash, DO;  Location: [facility];  Service: Pulmonary;;   BRONCHIAL NEEDLE ASPIRATION BIOPSY  07/30/2020   Procedure: BRONCHIAL NEEDLE ASPIRATION BIOPSIES;  Surgeon: Garner Nash, DO;  Location: [facility];  Service: Pulmonary;;   BRONCHIAL WASHINGS  07/30/2020   Procedure: BRONCHIAL WASHINGS;  Surgeon: Garner Nash, DO;  Location: [facility];  Service: Pulmonary;;   CHEST TUBE INSERTION Left 06/27/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     SKIN CANCER EXCISION     VIDEO BRONCHOSCOPY WITH ENDOBRONCHIAL NAVIGATION Left 07/30/2020  Procedure: VIDEO BRONCHOSCOPY WITH ENDOBRONCHIAL NAVIGATION;  Surgeon: Garner Nash, DO;  Location: [facility];  Service: Pulmonary;  Laterality: Left;       Family History  Problem Relation Age of Onset   Dementia Father     Social History   Tobacco Use   Smoking status: Former    Packs/day: 1.00    Years: 35.00    Pack years: 35.00    Types: Cigarettes    Quit date: 05/2016    Years since quitting: 4.4   Smokeless tobacco: Never  Vaping Use   Vaping Use: Never used  Substance Use  Topics   Alcohol use: No    Alcohol/week: 0.0 standard drinks    Comment: denies use of any drugs or alcohol   Drug use: No    Home Medications Prior to Admission medications   Medication Sig Start Date End Date Taking? Authorizing Provider  acetaminophen (TYLENOL) 500 MG tablet Take 1,000 mg by mouth every 6 (six) hours as needed for moderate pain.    [provider]  albuterol (PROVENTIL) (2.5 MG/3ML) 0.083% nebulizer solution Inhale 3 mLs (2.5 mg total) by nebulization every 4 (four) hours as needed for wheezing or shortness of breath. 08/04/20 08/04/21  Lavina Hamman, MD  albuterol (VENTOLIN HFA) 108 (90 Base) MCG/ACT inhaler Inhale 2 puffs into the lungs every 6 (six) hours as needed for wheezing. 06/02/20   [provider]  aspirin 81 MG EC tablet Take 81 mg by mouth daily.    [provider]  b complex vitamins capsule Take 1 capsule by mouth daily.    [provider]  bisacodyl (DULCOLAX) 5 MG EC tablet Take 1 tablet (5 mg total) by mouth daily at 12 noon. 08/04/20   Lavina Hamman, MD  buPROPion (WELLBUTRIN XL) 300 MG 24 hr tablet Take 300 mg by mouth daily. 02/05/20   [provider]  busPIRone (BUSPAR) 15 MG tablet Take 15 mg by mouth 2 (two) times daily.    [provider]  calcium-vitamin D (OSCAL WITH D) 500-200 MG-UNIT tablet Take 1 tablet by mouth 2 (two) times daily with a meal.    [provider]  Certolizumab Pegol 2 X 200 MG/ML PSKT Inject into the skin. INJECT 400MG (2 SYRINGES) SUBCUTANEOUSLY EVERY 2 WEEKS FOR 6 WEEKS, THEN INJECT 200MG (1 SYRINGE) EVERY 2 WEEKS --400 MG ON WEEK 0, 2 AND 4 FOR 3 DOSES ONLY, THEN MAINTENANCE: 200 MG  EVERY 2 WEEKS --400 MG ON WEEK 0, 2 AND 4 FOR 3 DOSES ONLY, THEN MAINTENANCE: 200 MG   EVERY 2 WEEKS Patient not taking: No sig reported 08/06/20   [provider]  Cholecalciferol (VITAMIN D-3) 25 MCG (1000 UT) CAPS Take 1,000 Units by mouth daily.    [provider]   cyclobenzaprine (FLEXERIL) 10 MG tablet Take 20 mg by mouth at bedtime.    [provider]  diltiazem (CARDIZEM CD) 180 MG 24 hr capsule Take 180 mg by mouth daily.    [provider]  docusate sodium (COLACE) 100 MG capsule Take 2 capsules (200 mg total) by mouth 2 (two) times daily. 08/04/20   Lavina Hamman, MD  DULoxetine (CYMBALTA) 60 MG capsule Take 1 capsule (60 mg total) by mouth 2 (two) times daily. 07/21/17   Lavina Hamman, MD  feeding supplement (ENSURE ENLIVE / ENSURE PLUS) LIQD Take 237 mLs by mouth 3 (three) times daily between meals. Patient not taking: No sig reported 08/04/20  Lavina Hamman, MD  fluticasone [facility]) 50 MCG/ACT nasal spray Place 2 sprays into both nostrils daily.     [provider]  folic acid (FOLVITE) 1 MG tablet Take 1 mg by mouth daily.    [provider]  gabapentin (NEURONTIN) 300 MG capsule Take 900 mg by mouth at bedtime.    [provider]  Garlic Oil 3 MG CAPS Take 3 mg by mouth daily.    [provider]  lisinopril (ZESTRIL) 20 MG tablet Take 20 mg by mouth daily.    [provider]  lurasidone (LATUDA) 40 MG TABS tablet Take 40 mg by mouth daily after supper.    [provider]  Melatonin 3 MG TABS Take 6 mg by mouth at bedtime.    [provider]  metFORMIN (GLUCOPHAGE) 500 MG tablet Take 500 mg by mouth 2 (two) times daily with a meal.    [provider]  Multiple Vitamin (MULTIVITAMIN WITH MINERALS) TABS tablet Take 1 tablet by mouth daily.    [provider]  mycophenolate (CELLCEPT) 250 MG capsule Take 1,500 mg by mouth 2 (two) times daily.    [provider]  nitroGLYCERIN (NITROSTAT) 0.4 MG SL tablet Place 1 tablet (0.4 mg total) under the tongue every 5 (five) minutes as needed for chest pain. 07/03/19   Danford, Suann Larry, MD  Olodaterol HCl (STRIVERDI RESPIMAT) 2.5 MCG/ACT AERS Inhale 2 puffs into the lungs daily.    [provider]  OVER THE COUNTER MEDICATION Place 1 drop into both ears daily as needed (itching). Miracell ProEar for irritated itchy ears    [provider]  OVER THE COUNTER MEDICATION Take 2 tablets by mouth at bedtime. Pure Zzzz    [provider]  oxcarbazepine (TRILEPTAL) 600 MG tablet Take 600 mg by mouth 3 (three) times daily. 07/16/20   [provider]  polyethylene glycol (MIRALAX / GLYCOLAX) 17 g packet Take 17 g by mouth daily.    [provider]  polyvinyl alcohol (LIQUIFILM TEARS) 1.4 % ophthalmic solution Place 1 drop into both eyes 4 (four) times daily as needed for dry eyes.    [provider]  PRESCRIPTION MEDICATION Inhale into the lungs See admin instructions. CPAP- At bedtime and during during any naps    [provider]  Propylene Glycol (SYSTANE BALANCE) 0.6 % SOLN Place 1 drop into both eyes in the morning, at noon, and at bedtime.     [provider]  sodium chloride (OCEAN) 0.65 % SOLN nasal spray Place 2 sprays into both nostrils 4 (four) times daily as needed for congestion.    [provider]  tamsulosin (FLOMAX) 0.4 MG CAPS capsule Take 0.4 mg by mouth daily.    [provider]  traMADol (ULTRAM) 50 MG tablet Take 100 mg by mouth at bedtime.    [provider]  traZODone (DESYREL) 50 MG tablet Take 75 mg by mouth at bedtime.    [provider]  VITAMIN A PO Take 1 tablet by mouth daily.    [provider]  vitamin C (ASCORBIC ACID) 500 MG tablet Take 500 mg by mouth 2 (two) times daily.     [provider]    Allergies    Demerol [meperidine] and Zocor [simvastatin]  Review of Systems   Review of Systems  Constitutional:  Positive for chills and fatigue. Negative for fever.  HENT:  Negative for rhinorrhea, sneezing and sore throat.   Respiratory:  Positive for shortness of breath. Negative for cough, chest tightness and wheezing.   Cardiovascular:   Negative for chest pain, palpitations and leg swelling.  Gastrointestinal:  Negative for abdominal pain, constipation, diarrhea and vomiting.  Genitourinary:  Negative for dysuria.  Musculoskeletal:  Positive for myalgias. Negative for arthralgias and joint swelling.  Neurological:  Positive for weakness. Negative for headaches.   Physical Exam Updated Vital Signs BP 108/79   Pulse 95   Temp 97.6 F (36.4 C) (Oral)   Resp 18   SpO2 95%   Physical Exam Constitutional:      General: He is not in acute distress.    Appearance: Normal appearance. He is not ill-appearing or diaphoretic.  HENT:     Head: Normocephalic and atraumatic.     Mouth/Throat:     Pharynx: Oropharynx is clear.  Eyes:     Extraocular Movements: Extraocular movements intact.  Cardiovascular:     Rate and Rhythm: Normal rate and regular rhythm.     Pulses: Normal pulses.     Heart sounds: Normal heart sounds.  Pulmonary:     Effort: Pulmonary effort is normal.     Breath sounds: Normal breath sounds. No wheezing.  Abdominal:     General: Abdomen is flat.     Palpations: Abdomen is soft.  Musculoskeletal:        General: Normal range of motion.     Cervical back: Normal range of motion.  Skin:    General: Skin is warm and dry.  Neurological:     General: No focal deficit present.     Mental Status: He is alert and oriented to person, place, and time. Mental status is at baseline.  Psychiatric:        Mood and Affect: Mood normal.        Behavior: Behavior normal.    ED Results / Procedures / Treatments   Labs (all labs ordered are listed, but only abnormal results are displayed) Labs Reviewed  COMPREHENSIVE METABOLIC PANEL - Abnormal; Notable for the following components:      Result Value   Sodium 133 (*)    Chloride 95 (*)    Glucose, Bld 140 (*)    AST 46 (*)    ALT 49 (*)    All other components within normal limits  BLOOD GAS, VENOUS - Abnormal; Notable for the following components:    pCO2, Ven 41.9 (*)    All other components within normal limits  RESP PANEL BY RT-PCR (FLU A&B, COVID) [id number]  CBC WITH DIFFERENTIAL/PLATELET  TROPONIN I (HIGH SENSITIVITY)  TROPONIN I (HIGH SENSITIVITY)    EKG EKG Interpretation  Date/Time:  Sunday November 08 2020 17:35:19 EDT Ventricular Rate:  105 PR Interval:  211 QRS Duration: 110 QT Interval:  335 QTC Calculation: 443 R Axis:     Text Interpretation: EASI Derived Leads No significant change since last tracing Confirmed by Wandra Arthurs [phone]) on 11/08/2020 6:07:17 PM  Radiology DG Chest Port 1 View  Result Date: 11/08/2020 CLINICAL DATA:  Shortness of breath, generalized weakness and shortness of breath since 1,400 today. EXAM: PORTABLE CHEST 1 VIEW COMPARISON:  September 03, 2020. FINDINGS: Trachea midline. Cardiomediastinal contours and hilar structures are stable. Basilar opacity in the LEFT chest is unchanged since September 03, 2020. No new area of lobar consolidation.  No sign of pneumothorax. On limited assessment there is no acute skeletal process. IMPRESSION: Stable chest x-ray. No acute cardiopulmonary disease. Persistent opacity at the LEFT lung  base, indeterminate. May correspond to bronchogenic neoplasm. Suggest correlation with previous biopsy results and follow-up as warranted. Electronically Signed   By: Zetta Bills M.D.   On: 11/08/2020 18:35    Procedures Procedures   Medications Ordered in ED Medications  albuterol (VENTOLIN HFA) 108 (90 Base) MCG/ACT inhaler 2 puff (0 puffs Inhalation Hold 11/08/20 1846)  methylPREDNISolone sodium succinate (SOLU-MEDROL) 125 mg/2 mL injection 125 mg (125 mg Intravenous Given 11/08/20 1835)    ED Course  I have reviewed the triage vital signs and the nursing notes.  Pertinent labs & imaging results that were available during my care of the patient were reviewed by me and considered in my medical decision making (see chart for details).    MDM Rules/Calculators/A&P                            OLVIN ROHR is a 70 y.o. male with pertinent PMH of COPD, HTN, DM2, and NSCLC presenting with myalgia since this afternoon with associated DOE which is at his baseline. He requires 3L [place] of supplemental O2 at home when exerting himself. Vitals stable. No associated cough and sputum production, however given O2 sat 84 on presentation and DOE, started albuterol and solu-medrol. Pt is euvolemic on exam, no peripheral edema, no crackles or wheezing with lung ausculation. CXR negative for pneumonia. CBC without leukocytosis. COVID negative. Pt remains saturating well at > 90%, lungs clear to auscultation bilaterally, and remains in NAD. This is likely due to a viral illness, and suspect will improve over the next several days with conservative management.   Final Clinical Impression(s) / ED Diagnoses Final diagnoses:  Viral infection    Rx / DC Orders ED Discharge Orders     None        Lajean Manes, MD 11/08/20 2034    Drenda Freeze, MD 11/12/20 [phone]

## 2020-11-08 NOTE — ED Triage Notes (Signed)
Pt arrives via ems from home. Pt reports generalized weakness and sob since 1400. Initial o2 was 84%.

## 2020-11-08 NOTE — Discharge Instructions (Addendum)
Mario [name] you came in with weakness. We did a workup on you and monitored you for this. This shows that you do not have COVID,  heart attack, pneumonia, or any blood infection. What you are experiencing is likely a viral illness which will improve over the next several days. You can take over the count tylenol as need for chills, fever, weakness. Please return to the ED if you have worsening shortness of breath, weakness or develop chest pain.

## 2020-11-11 ENCOUNTER — Encounter: Payer: Self-pay | Admitting: Radiation Oncology

## 2020-11-18 NOTE — Progress Notes (Signed)
Radiation Oncology         [phone] ________________________________  Name: Mario [name] MRN: [id number]  Date: 11/19/2020  DOB: Oct 06, 1950  Follow-Up Visit Note  CC: Clinic, [facility]    ICD-10-CM   1. Non-small cell cancer of left lung ([place])  C34.92     2. Lung nodule  R91.1       Diagnosis: At [place]un Stage IIB (cT3, N0, M0) left lower lobe non-small cell lung cancer, adenocarcinoma  (chronic airspace opacity with cavitation in the left lower lobe noted to be gradually enlarging since 2018)  Interval Since Last Radiation: 1 month and 4 days   Intent: Curative  Radiation Treatment Dates: 10/05/2020 through 10/15/2020 Site Technique Total Dose (Gy) Dose per Fx (Gy) Completed Fx Beam Energies  Lung, Left: Lung_Lt IMRT 50/50 10 5/5 [id number]    Narrative:  The patient returns today for routine follow-up. He tolerated his recent IMRT treatment relatively well with the exception of back pain, intermittent dry cough, and some mild fatigue. Near the end of his treatment, the patient was noted to be SOB on exertion and at rest (O2 sats 97% on 3 L).    The patient recently presented to [facility] ED on [facility]/08/20 with weakness and shakiness; associated symptoms of shortness of breath and myalgias were noted upon arrival to the ED. Patient was noted to be hypoxic 84% per EMS but remained at 95% on his baseline 3 L while in the ED.  Patient was also found to have diminished breath sounds. Chest x-ray performed during ED course showed known mass in the left base. Attending physician, Dr. Darl Householder stated that the patients symptoms were likely viral. Patient was cleared to discharge home that same day.   The patient otherwise has not received any additional imaging since his initial consultation.      Reports tolerating his radiation therapy well without any significant side effects.  He denies any significant cough or hemoptysis.  He is comfortable on room air  as long as he is sitting.  He does use 3 L occasionally with movement.                          Allergies:  is allergic to demerol [meperidine] and zocor [simvastatin].  Meds: Current Outpatient Medications  Medication Sig Dispense Refill   acetaminophen (TYLENOL) 500 MG tablet Take 1,000 mg by mouth every 6 (six) hours as needed for moderate pain.     albuterol (PROVENTIL) (2.5 MG/3ML) 0.083% nebulizer solution Inhale 3 mLs (2.5 mg total) by nebulization every 4 (four) hours as needed for wheezing or shortness of breath. 90 mL 2   albuterol (VENTOLIN HFA) 108 (90 Base) MCG/ACT inhaler Inhale 2 puffs into the lungs every 6 (six) hours as needed for wheezing.     aspirin 81 MG EC tablet Take 81 mg by mouth daily.     b complex vitamins capsule Take 1 capsule by mouth daily.     bisacodyl (DULCOLAX) 5 MG EC tablet Take 1 tablet (5 mg total) by mouth daily at 12 noon. 30 tablet 0   buPROPion (WELLBUTRIN XL) 300 MG 24 hr tablet Take 300 mg by mouth daily.     busPIRone (BUSPAR) 15 MG tablet Take 15 mg by mouth 2 (two) times daily.     calcium-vitamin D (OSCAL WITH D) 500-200 MG-UNIT tablet Take 1 tablet by mouth 2 (two) times daily with a  meal.     Cholecalciferol (VITAMIN D-3) 25 MCG (1000 UT) CAPS Take 1,000 Units by mouth daily.     cyclobenzaprine (FLEXERIL) 10 MG tablet Take 20 mg by mouth at bedtime.     diltiazem (CARDIZEM CD) 180 MG 24 hr capsule Take 180 mg by mouth daily.     docusate sodium (COLACE) 100 MG capsule Take 2 capsules (200 mg total) by mouth 2 (two) times daily. 10 capsule 0   DULoxetine (CYMBALTA) 60 MG capsule Take 1 capsule (60 mg total) by mouth 2 (two) times daily. 60 capsule 0   feeding supplement (ENSURE ENLIVE / ENSURE PLUS) LIQD Take 237 mLs by mouth 3 (three) times daily between meals. 10000 mL 0   fluticasone (FLONASE) 50 MCG/ACT nasal spray Place 2 sprays into both nostrils daily.      folic acid (FOLVITE) 1 MG tablet Take 1 mg by mouth daily.     gabapentin  (NEURONTIN) 300 MG capsule Take 900 mg by mouth at bedtime.     Garlic Oil 3 MG CAPS Take 3 mg by mouth daily.     lisinopril (ZESTRIL) 20 MG tablet Take 20 mg by mouth daily.     lurasidone (LATUDA) 40 MG TABS tablet Take 40 mg by mouth daily after supper.     Melatonin 3 MG TABS Take 6 mg by mouth at bedtime.     metFORMIN (GLUCOPHAGE) 500 MG tablet Take 500 mg by mouth 2 (two) times daily with a meal.     Multiple Vitamin (MULTIVITAMIN WITH MINERALS) TABS tablet Take 1 tablet by mouth daily.     mycophenolate (CELLCEPT) 250 MG capsule Take 1,500 mg by mouth 2 (two) times daily.     Olodaterol HCl (STRIVERDI RESPIMAT) 2.5 MCG/ACT AERS Inhale 2 puffs into the lungs daily.     OVER THE COUNTER MEDICATION Place 1 drop into both ears daily as needed (itching). Miracell ProEar for irritated itchy ears     OVER THE COUNTER MEDICATION Take 2 tablets by mouth at bedtime. Pure Zzzz     oxcarbazepine (TRILEPTAL) 600 MG tablet Take 600 mg by mouth 3 (three) times daily.     polyethylene glycol (MIRALAX / GLYCOLAX) 17 g packet Take 17 g by mouth daily.     polyvinyl alcohol (LIQUIFILM TEARS) 1.4 % ophthalmic solution Place 1 drop into both eyes 4 (four) times daily as needed for dry eyes.     PRESCRIPTION MEDICATION Inhale into the lungs See admin instructions. CPAP- At bedtime and during during any naps     Propylene Glycol (SYSTANE BALANCE) 0.6 % SOLN Place 1 drop into both eyes in the morning, at noon, and at bedtime.      sodium chloride (OCEAN) 0.65 % SOLN nasal spray Place 2 sprays into both nostrils 4 (four) times daily as needed for congestion.     tamsulosin (FLOMAX) 0.4 MG CAPS capsule Take 0.4 mg by mouth daily.     traMADol (ULTRAM) 50 MG tablet Take 100 mg by mouth at bedtime.     traZODone (DESYREL) 50 MG tablet Take 75 mg by mouth at bedtime.     VITAMIN A PO Take 1 tablet by mouth daily.     vitamin C (ASCORBIC ACID) 500 MG tablet Take 500 mg by mouth 2 (two) times daily.       Certolizumab Pegol 2 X 200 MG/ML PSKT Inject into the skin. INJECT 400MG  (2 SYRINGES) SUBCUTANEOUSLY EVERY 2 WEEKS FOR 6 WEEKS, THEN INJECT 200MG  (1  SYRINGE) EVERY 2 WEEKS --400 MG ON WEEK 0, 2 AND 4 FOR 3 DOSES ONLY, THEN MAINTENANCE: 200 MG  EVERY 2 WEEKS --400 MG ON WEEK 0, 2 AND 4 FOR 3 DOSES ONLY, THEN MAINTENANCE: 200 MG   EVERY 2 WEEKS (Patient not taking: No sig reported)     nitroGLYCERIN (NITROSTAT) 0.4 MG SL tablet Place 1 tablet (0.4 mg total) under the tongue every 5 (five) minutes as needed for chest pain. (Patient not taking: Reported on 11/19/2020) 12 tablet 12   No current facility-administered medications for this encounter.    Physical Findings: The patient is in no acute distress. Patient is alert and oriented.  height is 6\' 2"  (1.88 m) and weight is 338 lb 3.2 oz (153.4 kg) (abnormal). His temperature is 97.9 F (36.6 C). His blood pressure is 135/83 and his pulse is 100. His respiration is 24 (abnormal) and oxygen saturation is 93%. .  No significant changes. Lungs are clear to auscultation bilaterally. Heart has regular rate and rhythm. No palpable cervical, supraclavicular, or axillary adenopathy. Abdomen soft, non-tender, normal bowel sounds.    Lab Findings: Lab Results  Component Value Date   WBC 8.9 11/08/2020   HGB 15.8 11/08/2020   HCT 46.0 11/08/2020   MCV 96.4 11/08/2020   PLT 296 11/08/2020    Radiographic Findings: DG Chest Port 1 View  Result Date: 11/08/2020 CLINICAL DATA:  Shortness of breath, generalized weakness and shortness of breath since 1,400 today. EXAM: PORTABLE CHEST 1 VIEW COMPARISON:  September 03, 2020. FINDINGS: Trachea midline. Cardiomediastinal contours and hilar structures are stable. Basilar opacity in the LEFT chest is unchanged since September 03, 2020. No new area of lobar consolidation.  No sign of pneumothorax. On limited assessment there is no acute skeletal process. IMPRESSION: Stable chest x-ray. No acute cardiopulmonary disease. Persistent  opacity at the LEFT lung base, indeterminate. May correspond to bronchogenic neoplasm. Suggest correlation with previous biopsy results and follow-up as warranted. Electronically Signed   By: Zetta Bills M.D.   On: 11/08/2020 18:35    Impression:  Stage IIB (cT3, N0, M0) left lower lobe non-small cell lung cancer, adenocarcinoma  (chronic airspace opacity with cavitation in the left lower lobe noted to be gradually enlarging since 2018)   The patient tolerated his radiation therapy well without any significant side effects at this point.  Plan: Patient is scheduled for a chest CT scan in November and follow-up with Dr. Julien Nordmann.  In light of his close follow-up with medical oncology have not scheduled him for formal follow-up appointment but would be glad to see him at any time.   __________________________  Blair Promise, PhD, MD   This document serves as a record of services personally performed by Gery Pray, MD. It was created on his behalf by Roney Mans, a trained medical scribe. The creation of this record is based on the scribe's personal observations and the provider's statements to them. This document has been checked and approved by the attending provider.

## 2020-11-18 NOTE — Progress Notes (Incomplete)
  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 He reports having pain to back and mild fatigue. Noted patient to have shortness of breath on exertion and at rest. O2 sats 97% on 3 L. Patient reports having a dry cough at times. He denied swallowing issues and skin is intact.  Plan: The patient will follow-up with radiation oncology in one month .  ________________________________________________ -----------------------------------  Blair Promise, PhD, MD  This document serves as a record of services personally performed by Gery Pray, MD. It was created on his behalf by Roney Mans, a trained medical scribe. The creation of this record is based on the scribe's personal observations and the provider's statements to them. This document has been checked and approved by the attending provider.

## 2020-11-19 ENCOUNTER — Other Ambulatory Visit: Payer: Self-pay

## 2020-11-19 ENCOUNTER — Ambulatory Visit
Admission: RE | Admit: 2020-11-19 | Discharge: 2020-11-19 | Disposition: A | Discharge status: Home / Self Care | Payer: No Typology Code available for payment source | Source: Ambulatory Visit | Attending: Radiation Oncology | Admitting: Radiation Oncology

## 2020-11-19 ENCOUNTER — Encounter: Payer: Self-pay | Admitting: Radiation Oncology

## 2020-11-19 VITALS — BP 135/83 | HR 100 | Temp 97.9°F | Resp 24 | Ht 74.0 in | Wt 338.2 lb

## 2020-11-19 DIAGNOSIS — Z79899 Other long term (current) drug therapy: Secondary | ICD-10-CM | POA: Diagnosis not present

## 2020-11-19 DIAGNOSIS — C3432 Malignant neoplasm of lower lobe, left bronchus or lung: Secondary | ICD-10-CM | POA: Insufficient documentation

## 2020-11-19 DIAGNOSIS — Z923 Personal history of irradiation: Secondary | ICD-10-CM | POA: Diagnosis not present

## 2020-11-19 DIAGNOSIS — Z7984 Long term (current) use of oral hypoglycemic drugs: Secondary | ICD-10-CM | POA: Diagnosis not present

## 2020-11-19 DIAGNOSIS — R911 Solitary pulmonary nodule: Secondary | ICD-10-CM

## 2020-11-19 DIAGNOSIS — Z7982 Long term (current) use of aspirin: Secondary | ICD-10-CM | POA: Diagnosis not present

## 2020-11-19 DIAGNOSIS — C3492 Malignant neoplasm of unspecified part of left bronchus or lung: Secondary | ICD-10-CM

## 2020-11-19 HISTORY — DX: Personal history of irradiation: Z92.3

## 2020-11-19 NOTE — Progress Notes (Signed)
Mario [name] is here today for follow up post radiation to the lung.  Lung Side: left  Does the patient complain of any of the following: Pain: Patient denies pain.  Shortness of breath w/wo exertion: Yes , reports shortness of breath on exertion. Patient wears oxygen @ 3 liters as needed.  Cough: no Hemoptysis: No Pain with swallowing: no Swallowing/choking concerns: no Appetite: Good Energy Level: Mild fatigue Post radiation skin Changes: skin remains intact    Additional comments if applicable:  Patient reports having nose bleed on yesterday.  Vitals:   11/19/20 1034  BP: 135/83  Pulse: 100  Resp: (!) 24  Temp: 97.9 F (36.6 C)  SpO2: 93%  Weight: (!) 338 lb 3.2 oz (153.4 kg)  Height: 6\' 2"  (1.88 m)

## 2020-12-25 ENCOUNTER — Emergency Department (HOSPITAL_BASED_OUTPATIENT_CLINIC_OR_DEPARTMENT_OTHER)
Admission: EM | Admit: 2020-12-25 | Discharge: 2020-12-26 | Disposition: A | Discharge status: Home / Self Care | Payer: No Typology Code available for payment source | Attending: Emergency Medicine | Admitting: Emergency Medicine

## 2020-12-25 ENCOUNTER — Other Ambulatory Visit: Payer: Self-pay

## 2020-12-25 ENCOUNTER — Emergency Department (HOSPITAL_BASED_OUTPATIENT_CLINIC_OR_DEPARTMENT_OTHER): Payer: No Typology Code available for payment source

## 2020-12-25 ENCOUNTER — Encounter (HOSPITAL_BASED_OUTPATIENT_CLINIC_OR_DEPARTMENT_OTHER): Payer: Self-pay | Admitting: Emergency Medicine

## 2020-12-25 DIAGNOSIS — Z87891 Personal history of nicotine dependence: Secondary | ICD-10-CM | POA: Insufficient documentation

## 2020-12-25 DIAGNOSIS — E119 Type 2 diabetes mellitus without complications: Secondary | ICD-10-CM | POA: Diagnosis not present

## 2020-12-25 DIAGNOSIS — J449 Chronic obstructive pulmonary disease, unspecified: Secondary | ICD-10-CM

## 2020-12-25 DIAGNOSIS — J441 Chronic obstructive pulmonary disease with (acute) exacerbation: Secondary | ICD-10-CM | POA: Insufficient documentation

## 2020-12-25 DIAGNOSIS — R0602 Shortness of breath: Secondary | ICD-10-CM | POA: Diagnosis present

## 2020-12-25 DIAGNOSIS — I10 Essential (primary) hypertension: Secondary | ICD-10-CM | POA: Diagnosis not present

## 2020-12-25 DIAGNOSIS — Z85118 Personal history of other malignant neoplasm of bronchus and lung: Secondary | ICD-10-CM | POA: Diagnosis not present

## 2020-12-25 DIAGNOSIS — Z79899 Other long term (current) drug therapy: Secondary | ICD-10-CM | POA: Diagnosis not present

## 2020-12-25 DIAGNOSIS — Z7984 Long term (current) use of oral hypoglycemic drugs: Secondary | ICD-10-CM | POA: Insufficient documentation

## 2020-12-25 DIAGNOSIS — Z7951 Long term (current) use of inhaled steroids: Secondary | ICD-10-CM | POA: Diagnosis not present

## 2020-12-25 DIAGNOSIS — Z7982 Long term (current) use of aspirin: Secondary | ICD-10-CM | POA: Insufficient documentation

## 2020-12-25 DIAGNOSIS — T59811A Toxic effect of smoke, accidental (unintentional), initial encounter: Secondary | ICD-10-CM

## 2020-12-25 MED ORDER — ALBUTEROL SULFATE (2.5 MG/3ML) 0.083% IN NEBU
5.0000 mg | INHALATION_SOLUTION | Freq: Once | RESPIRATORY_TRACT | Status: AC
  Administered 2020-12-25: 5 mg via RESPIRATORY_TRACT
  Filled 2020-12-25: qty 6

## 2020-12-25 MED ORDER — IPRATROPIUM BROMIDE 0.02 % IN SOLN
0.5000 mg | Freq: Once | RESPIRATORY_TRACT | Status: AC
  Administered 2020-12-25: 0.5 mg via RESPIRATORY_TRACT
  Filled 2020-12-25: qty 2.5
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
## 2020-12-26 MED ORDER — HYDROCODONE-ACETAMINOPHEN 5-325 MG PO TABS
1.0000 | ORAL_TABLET | Freq: Once | ORAL | Status: AC
  Administered 2020-12-26: 1 via ORAL
  Filled 2020-12-26: qty 1

## 2020-12-26 MED ORDER — ACETAMINOPHEN 325 MG PO TABS
650.0000 mg | ORAL_TABLET | Freq: Once | ORAL | Status: AC
  Administered 2020-12-26: 650 mg via ORAL
  Filled 2020-12-26: qty 2

## 2020-12-26 NOTE — ED Notes (Signed)
Called [facility] to get ETA/status update of when they were coming to get patient to take him home - Was advised that pt was 2nd on the list so it should not be too long.  04:18
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
## 2020-12-29 ENCOUNTER — Other Ambulatory Visit: Payer: Self-pay | Admitting: Medical Oncology

## 2020-12-29 ENCOUNTER — Telehealth: Payer: Self-pay | Admitting: Medical Oncology

## 2020-12-29 NOTE — Telephone Encounter (Signed)
Generalized Pain-I returned pt call at the request of XRT. He reports having generalized pain x 1 week and SOB. He did not say a specific part of the body -Just generalized He saw his PCP for the same recently . I instructed pt to contact his PCP . He said he would.

## 2020-12-30 ENCOUNTER — Emergency Department (HOSPITAL_COMMUNITY)
Admission: EM | Admit: 2020-12-30 | Discharge: 2020-12-31 | Disposition: A | Discharge status: Home / Self Care | Payer: No Typology Code available for payment source | Attending: Emergency Medicine | Admitting: Emergency Medicine

## 2020-12-30 ENCOUNTER — Other Ambulatory Visit: Payer: Self-pay

## 2020-12-30 ENCOUNTER — Emergency Department (HOSPITAL_COMMUNITY): Payer: No Typology Code available for payment source

## 2020-12-30 ENCOUNTER — Encounter (HOSPITAL_COMMUNITY): Payer: Self-pay

## 2020-12-30 DIAGNOSIS — L02211 Cutaneous abscess of abdominal wall: Secondary | ICD-10-CM | POA: Insufficient documentation

## 2020-12-30 DIAGNOSIS — E1122 Type 2 diabetes mellitus with diabetic chronic kidney disease: Secondary | ICD-10-CM | POA: Diagnosis not present

## 2020-12-30 DIAGNOSIS — R0602 Shortness of breath: Secondary | ICD-10-CM | POA: Insufficient documentation

## 2020-12-30 DIAGNOSIS — Z7984 Long term (current) use of oral hypoglycemic drugs: Secondary | ICD-10-CM | POA: Insufficient documentation

## 2020-12-30 DIAGNOSIS — J449 Chronic obstructive pulmonary disease, unspecified: Secondary | ICD-10-CM | POA: Diagnosis not present

## 2020-12-30 DIAGNOSIS — Z85118 Personal history of other malignant neoplasm of bronchus and lung: Secondary | ICD-10-CM | POA: Diagnosis not present

## 2020-12-30 DIAGNOSIS — N1831 Chronic kidney disease, stage 3a: Secondary | ICD-10-CM | POA: Insufficient documentation

## 2020-12-30 DIAGNOSIS — I129 Hypertensive chronic kidney disease with stage 1 through stage 4 chronic kidney disease, or unspecified chronic kidney disease: Secondary | ICD-10-CM | POA: Insufficient documentation

## 2020-12-30 DIAGNOSIS — Z79899 Other long term (current) drug therapy: Secondary | ICD-10-CM | POA: Insufficient documentation

## 2020-12-30 DIAGNOSIS — Z7951 Long term (current) use of inhaled steroids: Secondary | ICD-10-CM | POA: Diagnosis not present

## 2020-12-30 DIAGNOSIS — Z87891 Personal history of nicotine dependence: Secondary | ICD-10-CM | POA: Diagnosis not present

## 2020-12-30 DIAGNOSIS — Z7982 Long term (current) use of aspirin: Secondary | ICD-10-CM | POA: Insufficient documentation

## 2020-12-30 DIAGNOSIS — M791 Myalgia, unspecified site: Secondary | ICD-10-CM | POA: Diagnosis not present

## 2020-12-30 DIAGNOSIS — Z20822 Contact with and (suspected) exposure to covid-19: Secondary | ICD-10-CM | POA: Diagnosis not present

## 2020-12-30 DIAGNOSIS — M545 Low back pain, unspecified: Secondary | ICD-10-CM | POA: Diagnosis not present

## 2020-12-30 LAB — CBC WITH DIFFERENTIAL/PLATELET
Abs Immature Granulocytes: 0.05 10*3/uL (ref 0.00–0.07)
Basophils Absolute: 0.1 10*3/uL (ref 0.0–0.1)
Basophils Relative: 1 %
Eosinophils Absolute: 0.1 10*3/uL (ref 0.0–0.5)
Eosinophils Relative: 1 %
HCT: 44.4 % (ref 39.0–52.0)
Hemoglobin: 14.8 g/dL (ref 13.0–17.0)
Immature Granulocytes: 1 %
Lymphocytes Relative: 16 %
Lymphs Abs: 1.7 10*3/uL (ref 0.7–4.0)
MCH: 32.5 pg (ref 26.0–34.0)
MCHC: 33.3 g/dL (ref 30.0–36.0)
MCV: 97.6 fL (ref 80.0–100.0)
Monocytes Absolute: 1 10*3/uL (ref 0.1–1.0)
Monocytes Relative: 10 %
Neutro Abs: 7.9 10*3/uL — ABNORMAL HIGH (ref 1.7–7.7)
Neutrophils Relative %: 71 %
Platelets: 338 10*3/uL (ref 150–400)
RBC: 4.55 MIL/uL (ref 4.22–5.81)
RDW: 13 % (ref 11.5–15.5)
WBC: 10.9 10*3/uL — ABNORMAL HIGH (ref 4.0–10.5)
nRBC: 0 % (ref 0.0–0.2)

## 2020-12-30 LAB — BASIC METABOLIC PANEL
Anion gap: 10 (ref 5–15)
BUN: 14 mg/dL (ref 8–23)
CO2: 23 mmol/L (ref 22–32)
Calcium: 8.9 mg/dL (ref 8.9–10.3)
Chloride: 97 mmol/L — ABNORMAL LOW (ref 98–111)
Creatinine, Ser: 0.92 mg/dL (ref 0.61–1.24)
GFR, Estimated: >60 mL/min (ref >60)
Glucose, Bld: 142 mg/dL — ABNORMAL HIGH (ref 70–99)
Potassium: 4.3 mmol/L (ref 3.5–5.1)
Sodium: 130 mmol/L — ABNORMAL LOW (ref 135–145)

## 2020-12-30 LAB — C-REACTIVE PROTEIN: CRP: 3.9 mg/dL — ABNORMAL HIGH (ref <1.0)

## 2020-12-30 LAB — SEDIMENTATION RATE: Sed Rate: 4 mm/hr (ref 0–16)

## 2020-12-30 LAB — RESP PANEL BY RT-PCR (FLU A&B, COVID) ARPGX2
Influenza A by PCR: NEGATIVE
Influenza B by PCR: NEGATIVE
SARS Coronavirus 2 by RT PCR: NEGATIVE

## 2020-12-30 LAB — TROPONIN I (HIGH SENSITIVITY)
Troponin I (High Sensitivity): 4 ng/L (ref <18)
Troponin I (High Sensitivity): 5 ng/L (ref <18)

## 2020-12-30 NOTE — ED Provider Notes (Signed)
Emergency Medicine Provider Triage Evaluation Note  KWANE ROHL , a 70 y.o. male  was evaluated in triage.  Pt complains of generalized myalgias over the last week.  Patient does have chronic shortness of breath but it has been acutely worse over the last couple weeks.  He reports associated nausea, intermittent chest pains, and subjective fever. Denies any urinary symptoms.  Has a history of lung cancer and is currently in treatment.  Review of Systems  Positive:  Negative: See above   Physical Exam  BP (!) 149/91 (BP Location: Right Arm)   Pulse (!) 109   Temp 98.1 F (36.7 C) (Oral)   Resp 20   Ht 6\' 2"  (1.88 m)   Wt (!) 154.2 kg   SpO2 95%   BMI 43.65 kg/m  Gen:   Awake, no distress   Resp:  Normal effort  MSK:   Moves extremities without difficulty  Other:    Medical Decision Making  Medically screening exam initiated at 3:25 PM.  Appropriate orders placed.  DEONDRE MARINARO was informed that the remainder of the evaluation will be completed by another provider, this initial triage assessment does not replace that evaluation, and the importance of remaining in the ED until their evaluation is complete.     Myna Bright [place], PA-C 12/30/20 1526    Carmin Muskrat, MD 12/30/20 1714

## 2020-12-30 NOTE — ED Triage Notes (Signed)
Pt BIB EMS from home. Pt c/o body aches, SOB x1 week. Pt seen 5 days ago at [facility] ED for SOB. Pt has hx of lung cancer and COPD. Pt states SOB is worse with exertion. Pt is on 3L O2 [place] at home as needed. EMS reports pt was using his home O2 [place] at home and was 95% on 3L. Pt 92% RA.

## 2020-12-31 ENCOUNTER — Encounter (HOSPITAL_COMMUNITY): Payer: Self-pay

## 2020-12-31 ENCOUNTER — Emergency Department (HOSPITAL_COMMUNITY): Payer: No Typology Code available for payment source

## 2020-12-31 MED ORDER — SODIUM CHLORIDE 0.9 % IV BOLUS
500.0000 mL | Freq: Once | INTRAVENOUS | Status: AC
  Administered 2020-12-31: 500 mL via INTRAVENOUS

## 2020-12-31 MED ORDER — KETOROLAC TROMETHAMINE 30 MG/ML IJ SOLN
30.0000 mg | Freq: Once | INTRAMUSCULAR | Status: AC
  Administered 2020-12-31: 30 mg via INTRAVENOUS
  Filled 2020-12-31: qty 1

## 2020-12-31 MED ORDER — SODIUM CHLORIDE (PF) 0.9 % IJ SOLN
INTRAMUSCULAR | Status: AC
  Filled 2020-12-31: qty 50

## 2020-12-31 MED ORDER — ONDANSETRON HCL 4 MG/2ML IJ SOLN
4.0000 mg | Freq: Once | INTRAMUSCULAR | Status: AC
  Administered 2020-12-31: 4 mg via INTRAVENOUS
  Filled 2020-12-31: qty 2

## 2020-12-31 MED ORDER — MORPHINE SULFATE (PF) 4 MG/ML IV SOLN
4.0000 mg | Freq: Once | INTRAVENOUS | Status: AC
  Administered 2020-12-31: 4 mg via INTRAVENOUS
  Filled 2020-12-31: qty 1

## 2020-12-31 MED ORDER — IOHEXOL 350 MG/ML SOLN
100.0000 mL | Freq: Once | INTRAVENOUS | Status: AC | PRN
  Administered 2020-12-31: 100 mL via INTRAVENOUS

## 2020-12-31 MED ORDER — HYDROCODONE-ACETAMINOPHEN 5-325 MG PO TABS
1.0000 | ORAL_TABLET | Freq: Four times a day (QID) | ORAL | 0 refills | Status: DC | PRN
Start: 2020-12-31 — End: 2021-01-10

## 2020-12-31 NOTE — Discharge Instructions (Signed)
Take hydrocodone as prescribed as needed for pain.  Follow-up with your primary doctor in the next week if symptoms are not improving, and return to the ER if symptoms significantly worsen or change.

## 2020-12-31 NOTE — ED Provider Notes (Signed)
[facility] DEPT Provider Note   CSN: [id number] Arrival date & time: 12/30/20  1353     History Chief Complaint  Patient presents with   Shortness of Breath   Generalized Body Aches    Mario [name] is a 70 y.o. male.  Patient is a 70 year old male with history of COPD, hypertension, obstructive sleep apnea, and lung cancer in the left lower lobe.  Patient presenting today for evaluation of back pain and shortness of breath.  He describes pain in his low back that radiates up his back and into both arms.  This occurs intermittently throughout the day and has been occurring for the past week.  He denies any specific injury or trauma.  He also describes increased shortness of breath, but no fevers, chills, or cough.  He denies any radiation of his pain into his legs and denies any bowel or bladder complaints.  He is status post radiation therapy for the lung mass and is taking some sort of oral chemotherapy agent.  The history is provided by the patient.      Past Medical History:  Diagnosis Date   Anxiety    Bronchitis    COPD (chronic obstructive pulmonary disease) (HCC)    Depression    History of radiation therapy    Left lung- 10/05/20-10/15/20- Dr. Gery Pray   Hypertension    Mental disorder    MI (myocardial infarction) [facility])    ????   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[place]) 09/21/2020   Lung nodule 07/30/2020   Mass of left lung    Acute respiratory failure with hypoxemia ([place]) 07/26/2020   Constipation    CAP (community acquired pneumonia) 04/03/2020   COPD with acute exacerbation ([place]) 01/05/2020   Vertigo 12/26/2019   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   Stage 3a chronic kidney disease ([place]) 12/26/2019   Abnormal CT of the chest 12/26/2019   COPD (chronic  obstructive pulmonary disease) ([place])    AKI (acute kidney injury) ([place])    Hyperglycemia 06/23/2019   OSA (obstructive sleep apnea) 06/23/2019   Suicidal ideation 06/23/2019   Major depressive disorder, recurrent episode ([place]) 10/12/2018   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   Cellulitis of both feet 07/16/2017   DM2 (diabetes mellitus, type 2) ([place]) 07/16/2017   HLA B27 (HLA B27 positive) 07/16/2017   Acute chest pain    Hyperkalemia    Spontaneous pneumothorax 06/27/2016   Hypoxia    Pneumothorax on left    Dyspnea 05/23/2014   COPD exacerbation ([place]) 05/23/2014   Malingering 01/21/2013   Depression 01/11/2013   Tobacco abuse 01/11/2013   Obesity, unspecified 01/11/2013   Chest pain 01/10/2013   HTN (hypertension) 01/10/2013    Past Surgical History:  Procedure Laterality Date   BRONCHIAL BIOPSY  07/30/2020   Procedure: BRONCHIAL BIOPSIES;  Surgeon: Garner Nash, DO;  Location: [facility];  Service: Pulmonary;;   BRONCHIAL BRUSHINGS  07/30/2020   Procedure: BRONCHIAL BRUSHINGS;  Surgeon: Garner Nash, DO;  Location: [facility];  Service: Pulmonary;;   BRONCHIAL NEEDLE ASPIRATION BIOPSY  07/30/2020   Procedure: BRONCHIAL NEEDLE ASPIRATION BIOPSIES;  Surgeon: Garner Nash, DO;  Location: [facility];  Service: Pulmonary;;   BRONCHIAL WASHINGS  07/30/2020   Procedure:  BRONCHIAL WASHINGS;  Surgeon: Garner Nash, DO;  Location: [facility];  Service: Pulmonary;;   CHEST TUBE INSERTION Left 06/27/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     SKIN CANCER EXCISION     VIDEO BRONCHOSCOPY WITH ENDOBRONCHIAL NAVIGATION Left 07/30/2020   Procedure: VIDEO BRONCHOSCOPY WITH ENDOBRONCHIAL NAVIGATION;  Surgeon: Garner Nash, DO;  Location: [facility];  Service: Pulmonary;  Laterality: Left;       Family History  Problem Relation Age of Onset   Dementia Father     Social History   Tobacco Use   Smoking status: Former    Packs/day: 1.00     Years: 35.00    Pack years: 35.00    Types: Cigarettes    Quit date: 05/2016    Years since quitting: 4.5   Smokeless tobacco: Never  Vaping Use   Vaping Use: Never used  Substance Use Topics   Alcohol use: No    Alcohol/week: 0.0 standard drinks    Comment: denies use of any drugs or alcohol   Drug use: No    Home Medications Prior to Admission medications   Medication Sig Start Date End Date Taking? Authorizing Provider  acetaminophen (TYLENOL) 500 MG tablet Take 1,000 mg by mouth every 6 (six) hours as needed for moderate pain.    [provider]  albuterol (PROVENTIL) (2.5 MG/3ML) 0.083% nebulizer solution Inhale 3 mLs (2.5 mg total) by nebulization every 4 (four) hours as needed for wheezing or shortness of breath. 08/04/20 08/04/21  Lavina Hamman, MD  albuterol (VENTOLIN HFA) 108 (90 Base) MCG/ACT inhaler Inhale 2 puffs into the lungs every 6 (six) hours as needed for wheezing. 06/02/20   [provider]  aspirin 81 MG EC tablet Take 81 mg by mouth daily.    [provider]  b complex vitamins capsule Take 1 capsule by mouth daily.    [provider]  bisacodyl (DULCOLAX) 5 MG EC tablet Take 1 tablet (5 mg total) by mouth daily at 12 noon. 08/04/20   Lavina Hamman, MD  buPROPion (WELLBUTRIN XL) 300 MG 24 hr tablet Take 300 mg by mouth daily. 02/05/20   [provider]  busPIRone (BUSPAR) 15 MG tablet Take 15 mg by mouth 2 (two) times daily.    [provider]  calcium-vitamin D (OSCAL WITH D) 500-200 MG-UNIT tablet Take 1 tablet by mouth 2 (two) times daily with a meal.    [provider]  Certolizumab Pegol 2 X 200 MG/ML PSKT Inject into the skin. INJECT 400MG (2 SYRINGES) SUBCUTANEOUSLY EVERY 2 WEEKS FOR 6 WEEKS, THEN INJECT 200MG (1 SYRINGE) EVERY 2 WEEKS --400 MG ON WEEK 0, 2 AND 4 FOR 3 DOSES ONLY, THEN MAINTENANCE: 200 MG  EVERY 2 WEEKS --400 MG ON WEEK 0, 2 AND 4 FOR 3 DOSES ONLY, THEN MAINTENANCE: 200 MG   EVERY 2  WEEKS Patient not taking: No sig reported 08/06/20   [provider]  Cholecalciferol (VITAMIN D-3) 25 MCG (1000 UT) CAPS Take 1,000 Units by mouth daily.    [provider]  cyclobenzaprine (FLEXERIL) 10 MG tablet Take 20 mg by mouth at bedtime.    [provider]  diltiazem (CARDIZEM CD) 180 MG 24 hr capsule Take 180 mg by mouth daily.    [provider]  docusate sodium (COLACE) 100 MG capsule Take 2 capsules (200 mg total) by mouth 2 (two) times daily. 08/04/20   Lavina Hamman, MD  DULoxetine (CYMBALTA) 60 MG capsule Take  1 capsule (60 mg total) by mouth 2 (two) times daily. 07/21/17   Lavina Hamman, MD  feeding supplement (ENSURE ENLIVE / ENSURE PLUS) LIQD Take 237 mLs by mouth 3 (three) times daily between meals. 08/04/20   Lavina Hamman, MD  fluticasone Asencion Islam) 50 MCG/ACT nasal spray Place 2 sprays into both nostrils daily.     [provider]  folic acid (FOLVITE) 1 MG tablet Take 1 mg by mouth daily.    [provider]  gabapentin (NEURONTIN) 300 MG capsule Take 900 mg by mouth at bedtime.    [provider]  Garlic Oil 3 MG CAPS Take 3 mg by mouth daily.    [provider]  lisinopril (ZESTRIL) 20 MG tablet Take 20 mg by mouth daily.    [provider]  lurasidone (LATUDA) 40 MG TABS tablet Take 40 mg by mouth daily after supper.    [provider]  Melatonin 3 MG TABS Take 6 mg by mouth at bedtime.    [provider]  metFORMIN (GLUCOPHAGE) 500 MG tablet Take 500 mg by mouth 2 (two) times daily with a meal.    [provider]  Multiple Vitamin (MULTIVITAMIN WITH MINERALS) TABS tablet Take 1 tablet by mouth daily.    [provider]  mycophenolate (CELLCEPT) 250 MG capsule Take 1,500 mg by mouth 2 (two) times daily.    [provider]  nitroGLYCERIN (NITROSTAT) 0.4 MG SL tablet Place 1 tablet (0.4 mg total) under the tongue every 5 (five) minutes as needed for  chest pain. 07/03/19   Danford, Suann Larry, MD  Olodaterol HCl (STRIVERDI RESPIMAT) 2.5 MCG/ACT AERS Inhale 2 puffs into the lungs daily.    [provider]  OVER THE COUNTER MEDICATION Place 1 drop into both ears daily as needed (itching). Miracell ProEar for irritated itchy ears    [provider]  OVER THE COUNTER MEDICATION Take 2 tablets by mouth at bedtime. Pure Zzzz    [provider]  oxcarbazepine (TRILEPTAL) 600 MG tablet Take 600 mg by mouth 3 (three) times daily. 07/16/20   [provider]  polyethylene glycol (MIRALAX / GLYCOLAX) 17 g packet Take 17 g by mouth daily.    [provider]  polyvinyl alcohol (LIQUIFILM TEARS) 1.4 % ophthalmic solution Place 1 drop into both eyes 4 (four) times daily as needed for dry eyes.    [provider]  PRESCRIPTION MEDICATION Inhale into the lungs See admin instructions. CPAP- At bedtime and during during any naps    [provider]  Propylene Glycol (SYSTANE BALANCE) 0.6 % SOLN Place 1 drop into both eyes in the morning, at noon, and at bedtime.     [provider]  sodium chloride (OCEAN) 0.65 % SOLN nasal spray Place 2 sprays into both nostrils 4 (four) times daily as needed for congestion.    [provider]  tamsulosin (FLOMAX) 0.4 MG CAPS capsule Take 0.4 mg by mouth daily.    [provider]  traMADol (ULTRAM) 50 MG tablet Take 100 mg by mouth at bedtime.    [provider]  traZODone (DESYREL) 50 MG tablet Take 75 mg by mouth at bedtime.    [provider]  VITAMIN A PO Take 1 tablet by mouth daily.    [provider]  vitamin C (ASCORBIC ACID) 500 MG tablet Take 500 mg by mouth 2 (two) times daily.     [provider]    Allergies  Demerol [meperidine] and Zocor [simvastatin]  Review of Systems   Review of Systems  All other systems reviewed and are negative.  Physical Exam Updated Vital Signs BP (!) 147/85  (BP Location: Left Arm)   Pulse 92   Temp 98.3 F (36.8 C) (Oral)   Resp 18   Ht '6\' 2"'  (1.88 m)   Wt (!) 154.2 kg   SpO2 90%   BMI 43.65 kg/m   Physical Exam Vitals and nursing note reviewed.  Constitutional:      General: He is not in acute distress.    Appearance: He is well-developed. He is not diaphoretic.  HENT:     Head: Normocephalic and atraumatic.  Cardiovascular:     Rate and Rhythm: Normal rate and regular rhythm.     Heart sounds: No murmur heard.   No friction rub.  Pulmonary:     Effort: Pulmonary effort is normal. No respiratory distress.     Breath sounds: Normal breath sounds. No wheezing or rales.  Abdominal:     General: Bowel sounds are normal. There is no distension.     Palpations: Abdomen is soft.     Tenderness: There is no abdominal tenderness.  Musculoskeletal:        General: Normal range of motion.     Cervical back: Normal range of motion and neck supple.     Comments: There is tenderness to palpation in the soft tissues of the lumbar region.  There is no bony tenderness or step-off.  Skin:    General: Skin is warm and dry.  Neurological:     Mental Status: He is alert and oriented to person, place, and time.     Coordination: Coordination normal.    ED Results / Procedures / Treatments   Labs (all labs ordered are listed, but only abnormal results are displayed) Labs Reviewed  CBC WITH DIFFERENTIAL/PLATELET - Abnormal; Notable for the following components:      Result Value   WBC 10.9 (*)    Neutro Abs 7.9 (*)    All other components within normal limits  BASIC METABOLIC PANEL - Abnormal; Notable for the following components:   Sodium 130 (*)    Chloride 97 (*)    Glucose, Bld 142 (*)    All other components within normal limits  C-REACTIVE PROTEIN - Abnormal; Notable for the following components:   CRP 3.9 (*)    All other components within normal limits  RESP PANEL BY RT-PCR (FLU A&B, COVID) ARPGX2  SEDIMENTATION RATE   TROPONIN I (HIGH SENSITIVITY)  TROPONIN I (HIGH SENSITIVITY)    EKG None  Radiology DG Chest 2 View  Result Date: 12/30/2020 CLINICAL DATA:  Shortness of breath. EXAM: CHEST - 2 VIEW COMPARISON:  Chest x-ray 12/25/2020. PET CT 09/18/2020. FINDINGS: There is a stable small left pleural effusion. There is some increasing patchy airspace disease in the left lower lobe. There is no evidence for pneumothorax. The cardiomediastinal silhouette is within normal limits. IMPRESSION: 1. Increasing left lower lobe patchy airspace disease. Findings are concerning for infection. Followup PA and lateral chest X-ray is recommended in 3-4 weeks following trial of antibiotic therapy to ensure resolution and exclude underlying malignancy. 2. Stable small left pleural effusion. Electronically Signed   By: Ronney Asters M.D.   On: 12/30/2020 15:53    Procedures Procedures   Medications Ordered in ED Medications  sodium chloride 0.9 % bolus 500 mL (has no administration in time range)  ketorolac (TORADOL) 30 MG/ML  injection 30 mg (has no administration in time range)  morphine 4 MG/ML injection 4 mg (has no administration in time range)    ED Course  I have reviewed the triage vital signs and the nursing notes.  Pertinent labs & imaging results that were available during my care of the patient were reviewed by me and considered in my medical decision making (see chart for details).    MDM Rules/Calculators/A&P  Patient is a 70 year old male with history of lung cancer presenting with complaints of pain in his back extending down both arms.  This began earlier this week and has been occurring intermittently since.  Patient's physical examination is unremarkable and laboratory studies are reassuring.  I did obtain CT scan of the chest, abdomen, and pelvis as well as lumbar spine due to his history of malignancy.  There is no evidence for metastasis or spread to these areas.  I have been unable to  establish the cause of his discomfort, however vital signs remained stable and he appears comfortable.  At this point, I feel as though patient can safely be discharged with outpatient follow-up.  He will be prescribed medication for his pain.  Final Clinical Impression(s) / ED Diagnoses Final diagnoses:  None    Rx / DC Orders ED Discharge Orders     None        Veryl Speak, MD 12/31/20 [phone]

## 2021-01-08 ENCOUNTER — Encounter (HOSPITAL_COMMUNITY): Payer: Self-pay | Admitting: Emergency Medicine

## 2021-01-08 ENCOUNTER — Other Ambulatory Visit: Payer: Self-pay

## 2021-01-08 ENCOUNTER — Emergency Department (HOSPITAL_COMMUNITY): Payer: No Typology Code available for payment source

## 2021-01-08 ENCOUNTER — Inpatient Hospital Stay (HOSPITAL_COMMUNITY)
Admission: EM | Admit: 2021-01-08 | Discharge: 2021-01-10 | DRG: 193 | Disposition: A | Discharge status: Home / Self Care | Payer: No Typology Code available for payment source | Attending: Internal Medicine | Admitting: Internal Medicine

## 2021-01-08 DIAGNOSIS — R059 Cough, unspecified: Secondary | ICD-10-CM | POA: Diagnosis not present

## 2021-01-08 DIAGNOSIS — Z923 Personal history of irradiation: Secondary | ICD-10-CM

## 2021-01-08 DIAGNOSIS — Z7960 Long term (current) use of unspecified immunomodulators and immunosuppressants: Secondary | ICD-10-CM

## 2021-01-08 DIAGNOSIS — Z7984 Long term (current) use of oral hypoglycemic drugs: Secondary | ICD-10-CM

## 2021-01-08 DIAGNOSIS — N400 Enlarged prostate without lower urinary tract symptoms: Secondary | ICD-10-CM | POA: Diagnosis present

## 2021-01-08 DIAGNOSIS — G4733 Obstructive sleep apnea (adult) (pediatric): Secondary | ICD-10-CM | POA: Diagnosis present

## 2021-01-08 DIAGNOSIS — Z6841 Body Mass Index (BMI) 40.0 and over, adult: Secondary | ICD-10-CM

## 2021-01-08 DIAGNOSIS — C3492 Malignant neoplasm of unspecified part of left bronchus or lung: Secondary | ICD-10-CM | POA: Diagnosis present

## 2021-01-08 DIAGNOSIS — M545 Low back pain, unspecified: Secondary | ICD-10-CM | POA: Diagnosis present

## 2021-01-08 DIAGNOSIS — B957 Other staphylococcus as the cause of diseases classified elsewhere: Secondary | ICD-10-CM | POA: Diagnosis present

## 2021-01-08 DIAGNOSIS — F419 Anxiety disorder, unspecified: Secondary | ICD-10-CM | POA: Diagnosis present

## 2021-01-08 DIAGNOSIS — J439 Emphysema, unspecified: Secondary | ICD-10-CM | POA: Diagnosis present

## 2021-01-08 DIAGNOSIS — Z7982 Long term (current) use of aspirin: Secondary | ICD-10-CM

## 2021-01-08 DIAGNOSIS — Z79899 Other long term (current) drug therapy: Secondary | ICD-10-CM

## 2021-01-08 DIAGNOSIS — D84821 Immunodeficiency due to drugs: Secondary | ICD-10-CM | POA: Diagnosis present

## 2021-01-08 DIAGNOSIS — I252 Old myocardial infarction: Secondary | ICD-10-CM

## 2021-01-08 DIAGNOSIS — J9620 Acute and chronic respiratory failure, unspecified whether with hypoxia or hypercapnia: Secondary | ICD-10-CM | POA: Diagnosis present

## 2021-01-08 DIAGNOSIS — Z79631 Long term (current) use of antimetabolite agent: Secondary | ICD-10-CM

## 2021-01-08 DIAGNOSIS — I10 Essential (primary) hypertension: Secondary | ICD-10-CM | POA: Diagnosis present

## 2021-01-08 DIAGNOSIS — E119 Type 2 diabetes mellitus without complications: Secondary | ICD-10-CM

## 2021-01-08 DIAGNOSIS — I2510 Atherosclerotic heart disease of native coronary artery without angina pectoris: Secondary | ICD-10-CM | POA: Diagnosis present

## 2021-01-08 DIAGNOSIS — J189 Pneumonia, unspecified organism: Secondary | ICD-10-CM | POA: Diagnosis not present

## 2021-01-08 DIAGNOSIS — H209 Unspecified iridocyclitis: Secondary | ICD-10-CM | POA: Diagnosis present

## 2021-01-08 DIAGNOSIS — Z885 Allergy status to narcotic agent status: Secondary | ICD-10-CM

## 2021-01-08 DIAGNOSIS — Z20822 Contact with and (suspected) exposure to covid-19: Secondary | ICD-10-CM | POA: Diagnosis present

## 2021-01-08 DIAGNOSIS — F32A Depression, unspecified: Secondary | ICD-10-CM | POA: Diagnosis present

## 2021-01-08 DIAGNOSIS — J9621 Acute and chronic respiratory failure with hypoxia: Secondary | ICD-10-CM | POA: Diagnosis present

## 2021-01-08 DIAGNOSIS — I700 Atherosclerosis of aorta: Secondary | ICD-10-CM | POA: Diagnosis present

## 2021-01-08 DIAGNOSIS — Z79891 Long term (current) use of opiate analgesic: Secondary | ICD-10-CM

## 2021-01-08 DIAGNOSIS — M459 Ankylosing spondylitis of unspecified sites in spine: Secondary | ICD-10-CM | POA: Diagnosis present

## 2021-01-08 DIAGNOSIS — Z87891 Personal history of nicotine dependence: Secondary | ICD-10-CM

## 2021-01-08 DIAGNOSIS — Z888 Allergy status to other drugs, medicaments and biological substances status: Secondary | ICD-10-CM

## 2021-01-08 DIAGNOSIS — G894 Chronic pain syndrome: Secondary | ICD-10-CM | POA: Diagnosis present

## 2021-01-08 DIAGNOSIS — R079 Chest pain, unspecified: Secondary | ICD-10-CM

## 2021-01-08 LAB — COMPREHENSIVE METABOLIC PANEL
ALT: 37 U/L (ref 0–44)
AST: 25 U/L (ref 15–41)
Albumin: 4.3 g/dL (ref 3.5–5.0)
Alkaline Phosphatase: 64 U/L (ref 38–126)
Anion gap: 10 (ref 5–15)
BUN: 14 mg/dL (ref 8–23)
CO2: 22 mmol/L (ref 22–32)
Calcium: 9.2 mg/dL (ref 8.9–10.3)
Chloride: 100 mmol/L (ref 98–111)
Creatinine, Ser: 1 mg/dL (ref 0.61–1.24)
GFR, Estimated: >60 mL/min (ref >60)
Glucose, Bld: 209 mg/dL — ABNORMAL HIGH (ref 70–99)
Potassium: 4.2 mmol/L (ref 3.5–5.1)
Sodium: 132 mmol/L — ABNORMAL LOW (ref 135–145)
Total Bilirubin: 0.4 mg/dL (ref 0.3–1.2)
Total Protein: 7.2 g/dL (ref 6.5–8.1)

## 2021-01-08 LAB — CBC WITH DIFFERENTIAL/PLATELET
Abs Immature Granulocytes: 0.08 10*3/uL — ABNORMAL HIGH (ref 0.00–0.07)
Basophils Absolute: 0.1 10*3/uL (ref 0.0–0.1)
Basophils Relative: 1 %
Eosinophils Absolute: 0.1 10*3/uL (ref 0.0–0.5)
Eosinophils Relative: 1 %
HCT: 42.5 % (ref 39.0–52.0)
Hemoglobin: 14.5 g/dL (ref 13.0–17.0)
Immature Granulocytes: 1 %
Lymphocytes Relative: 14 %
Lymphs Abs: 1.8 10*3/uL (ref 0.7–4.0)
MCH: 33 pg (ref 26.0–34.0)
MCHC: 34.1 g/dL (ref 30.0–36.0)
MCV: 96.8 fL (ref 80.0–100.0)
Monocytes Absolute: 1 10*3/uL (ref 0.1–1.0)
Monocytes Relative: 8 %
Neutro Abs: 9.9 10*3/uL — ABNORMAL HIGH (ref 1.7–7.7)
Neutrophils Relative %: 75 %
Platelets: 359 10*3/uL (ref 150–400)
RBC: 4.39 MIL/uL (ref 4.22–5.81)
RDW: 13.3 % (ref 11.5–15.5)
WBC: 13 10*3/uL — ABNORMAL HIGH (ref 4.0–10.5)
nRBC: 0 % (ref 0.0–0.2)

## 2021-01-08 LAB — URINALYSIS, ROUTINE W REFLEX MICROSCOPIC
Bilirubin Urine: NEGATIVE
Glucose, UA: NEGATIVE mg/dL
Hgb urine dipstick: NEGATIVE
Ketones, ur: NEGATIVE mg/dL
Leukocytes,Ua: NEGATIVE
Nitrite: NEGATIVE
Protein, ur: NEGATIVE mg/dL
Specific Gravity, Urine: 1.019 (ref 1.005–1.030)
pH: 6 (ref 5.0–8.0)

## 2021-01-08 LAB — TROPONIN I (HIGH SENSITIVITY): Troponin I (High Sensitivity): 5 ng/L (ref <18)

## 2021-01-08 LAB — LIPASE, BLOOD: Lipase: 36 U/L (ref 11–51)

## 2021-01-08 MED ORDER — ONDANSETRON HCL 4 MG/2ML IJ SOLN
4.0000 mg | Freq: Once | INTRAMUSCULAR | Status: AC
  Administered 2021-01-08: 4 mg via INTRAVENOUS
  Filled 2021-01-08: qty 2

## 2021-01-08 MED ORDER — MORPHINE SULFATE (PF) 4 MG/ML IV SOLN
4.0000 mg | Freq: Once | INTRAVENOUS | Status: AC
  Administered 2021-01-08: 4 mg via INTRAVENOUS
  Filled 2021-01-08: qty 1

## 2021-01-08 NOTE — ED Notes (Signed)
Pt is aware of the need for a urine specimen.

## 2021-01-08 NOTE — ED Provider Notes (Signed)
Emergency Medicine Provider Triage Evaluation Note  Mario [name] , a 70 y.o. male  was evaluated in triage.  Pt complains of low back pain and lower abdominal pain  Review of Systems  Positive: Pain with coughing Negative: fever  Physical Exam  BP (!) 151/88   Pulse (!) 102   Temp 98.3 F (36.8 C) (Oral)   Resp 20   SpO2 93%  Gen:   Awake, no distress   Resp:  Normal effort  MSK:   Moves extremities. Pain in back with movement  Other:    Medical Decision Making  Medically screening exam initiated at 6:56 PM.  Appropriate orders placed.  DEVARION MCCLANAHAN was informed that the remainder of the evaluation will be completed by another provider, this initial triage assessment does not replace that evaluation, and the importance of remaining in the ED until their evaluation is complete.     Sidney Ace 01/08/21 2120    Lajean Saver, MD 01/11/21 [phone]

## 2021-01-08 NOTE — ED Triage Notes (Signed)
Pt BIB EMS from home. Pt reports back pain that has become worse over the past 2 weeks. He reports radiation to anterior chest wall when taking a deep breath.

## 2021-01-09 ENCOUNTER — Emergency Department (HOSPITAL_COMMUNITY): Payer: No Typology Code available for payment source

## 2021-01-09 ENCOUNTER — Encounter (HOSPITAL_COMMUNITY): Payer: Self-pay

## 2021-01-09 DIAGNOSIS — F32A Depression, unspecified: Secondary | ICD-10-CM

## 2021-01-09 DIAGNOSIS — I252 Old myocardial infarction: Secondary | ICD-10-CM | POA: Diagnosis not present

## 2021-01-09 DIAGNOSIS — Z923 Personal history of irradiation: Secondary | ICD-10-CM | POA: Diagnosis not present

## 2021-01-09 DIAGNOSIS — H209 Unspecified iridocyclitis: Secondary | ICD-10-CM | POA: Diagnosis present

## 2021-01-09 DIAGNOSIS — E119 Type 2 diabetes mellitus without complications: Secondary | ICD-10-CM | POA: Diagnosis present

## 2021-01-09 DIAGNOSIS — I2510 Atherosclerotic heart disease of native coronary artery without angina pectoris: Secondary | ICD-10-CM | POA: Diagnosis present

## 2021-01-09 DIAGNOSIS — J439 Emphysema, unspecified: Secondary | ICD-10-CM | POA: Diagnosis present

## 2021-01-09 DIAGNOSIS — G4733 Obstructive sleep apnea (adult) (pediatric): Secondary | ICD-10-CM | POA: Diagnosis present

## 2021-01-09 DIAGNOSIS — E1165 Type 2 diabetes mellitus with hyperglycemia: Secondary | ICD-10-CM

## 2021-01-09 DIAGNOSIS — J189 Pneumonia, unspecified organism: Principal | ICD-10-CM

## 2021-01-09 DIAGNOSIS — Z6841 Body Mass Index (BMI) 40.0 and over, adult: Secondary | ICD-10-CM | POA: Diagnosis not present

## 2021-01-09 DIAGNOSIS — M545 Low back pain, unspecified: Secondary | ICD-10-CM | POA: Diagnosis present

## 2021-01-09 DIAGNOSIS — F419 Anxiety disorder, unspecified: Secondary | ICD-10-CM | POA: Diagnosis present

## 2021-01-09 DIAGNOSIS — N400 Enlarged prostate without lower urinary tract symptoms: Secondary | ICD-10-CM | POA: Diagnosis present

## 2021-01-09 DIAGNOSIS — J9621 Acute and chronic respiratory failure with hypoxia: Secondary | ICD-10-CM | POA: Diagnosis present

## 2021-01-09 DIAGNOSIS — I700 Atherosclerosis of aorta: Secondary | ICD-10-CM | POA: Diagnosis present

## 2021-01-09 DIAGNOSIS — R059 Cough, unspecified: Secondary | ICD-10-CM | POA: Diagnosis present

## 2021-01-09 DIAGNOSIS — Z7960 Long term (current) use of unspecified immunomodulators and immunosuppressants: Secondary | ICD-10-CM | POA: Diagnosis not present

## 2021-01-09 DIAGNOSIS — C3492 Malignant neoplasm of unspecified part of left bronchus or lung: Secondary | ICD-10-CM | POA: Diagnosis present

## 2021-01-09 DIAGNOSIS — G894 Chronic pain syndrome: Secondary | ICD-10-CM | POA: Diagnosis present

## 2021-01-09 DIAGNOSIS — D84821 Immunodeficiency due to drugs: Secondary | ICD-10-CM | POA: Diagnosis present

## 2021-01-09 DIAGNOSIS — M459 Ankylosing spondylitis of unspecified sites in spine: Secondary | ICD-10-CM | POA: Diagnosis present

## 2021-01-09 DIAGNOSIS — J9620 Acute and chronic respiratory failure, unspecified whether with hypoxia or hypercapnia: Secondary | ICD-10-CM | POA: Diagnosis present

## 2021-01-09 DIAGNOSIS — B957 Other staphylococcus as the cause of diseases classified elsewhere: Secondary | ICD-10-CM | POA: Diagnosis present

## 2021-01-09 DIAGNOSIS — Z20822 Contact with and (suspected) exposure to covid-19: Secondary | ICD-10-CM | POA: Diagnosis present

## 2021-01-09 DIAGNOSIS — I10 Essential (primary) hypertension: Secondary | ICD-10-CM | POA: Diagnosis present

## 2021-01-09 LAB — GLUCOSE, CAPILLARY
Glucose-Capillary: 141 mg/dL — ABNORMAL HIGH (ref 70–99)
Glucose-Capillary: 106 mg/dL — ABNORMAL HIGH (ref 70–99)
Glucose-Capillary: 120 mg/dL — ABNORMAL HIGH (ref 70–99)
Glucose-Capillary: 163 mg/dL — ABNORMAL HIGH (ref 70–99)

## 2021-01-09 LAB — HEMOGLOBIN A1C
Hgb A1c MFr Bld: 7 % — ABNORMAL HIGH (ref 4.8–5.6)
Mean Plasma Glucose: 154.2 mg/dL

## 2021-01-09 LAB — RESP PANEL BY RT-PCR (FLU A&B, COVID) ARPGX2
Influenza A by PCR: NEGATIVE
Influenza B by PCR: NEGATIVE
SARS Coronavirus 2 by RT PCR: NEGATIVE

## 2021-01-09 LAB — CBC
HCT: 42.8 % (ref 39.0–52.0)
Hemoglobin: 14.2 g/dL (ref 13.0–17.0)
MCH: 32.8 pg (ref 26.0–34.0)
MCHC: 33.2 g/dL (ref 30.0–36.0)
MCV: 98.8 fL (ref 80.0–100.0)
Platelets: 343 10*3/uL (ref 150–400)
RBC: 4.33 MIL/uL (ref 4.22–5.81)
RDW: 13.3 % (ref 11.5–15.5)
WBC: 10.3 10*3/uL (ref 4.0–10.5)
nRBC: 0 % (ref 0.0–0.2)

## 2021-01-09 LAB — PROCALCITONIN: Procalcitonin: <0.1 ng/mL

## 2021-01-09 LAB — LACTIC ACID, PLASMA: Lactic Acid, Venous: 1.7 mmol/L (ref 0.5–1.9)

## 2021-01-09 LAB — TROPONIN I (HIGH SENSITIVITY): Troponin I (High Sensitivity): 5 ng/L (ref <18)

## 2021-01-09 LAB — MRSA NEXT GEN BY PCR, NASAL: MRSA by PCR Next Gen: NOT DETECTED

## 2021-01-09 MED ORDER — INSULIN ASPART 100 UNIT/ML IJ SOLN
0.0000 [IU] | Freq: Every day | INTRAMUSCULAR | Status: DC
Start: 2021-01-09 — End: 2021-01-10

## 2021-01-09 MED ORDER — BUSPIRONE HCL 5 MG PO TABS
15.0000 mg | ORAL_TABLET | Freq: Every day | ORAL | Status: DC
  Administered 2021-01-09 – 2021-01-10 (×2): 15 mg via ORAL
  Filled 2021-01-09 (×2): qty 1

## 2021-01-09 MED ORDER — METHOTREXATE 2.5 MG PO TABS: 15.0000 mg | ORAL_TABLET | ORAL | Status: DC

## 2021-01-09 MED ORDER — POLYVINYL ALCOHOL 1.4 % OP SOLN
1.0000 [drp] | OPHTHALMIC | Status: DC | PRN
  Administered 2021-01-09: 1 [drp] via OPHTHALMIC
  Filled 2021-01-09: qty 15

## 2021-01-09 MED ORDER — ACETAMINOPHEN 650 MG RE SUPP: 650.0000 mg | Freq: Four times a day (QID) | RECTAL | Status: DC | PRN

## 2021-01-09 MED ORDER — TAMSULOSIN HCL 0.4 MG PO CAPS
0.4000 mg | ORAL_CAPSULE | Freq: Every day | ORAL | Status: DC
  Administered 2021-01-09 – 2021-01-10 (×2): 0.4 mg via ORAL
  Filled 2021-01-09 (×2): qty 1

## 2021-01-09 MED ORDER — ALBUTEROL SULFATE (2.5 MG/3ML) 0.083% IN NEBU: 3.0000 mL | INHALATION_SOLUTION | Freq: Four times a day (QID) | RESPIRATORY_TRACT | Status: DC | PRN

## 2021-01-09 MED ORDER — INSULIN ASPART 100 UNIT/ML IJ SOLN
0.0000 [IU] | Freq: Three times a day (TID) | INTRAMUSCULAR | Status: DC
  Administered 2021-01-09: 1 [IU] via SUBCUTANEOUS
  Administered 2021-01-10: 2 [IU] via SUBCUTANEOUS

## 2021-01-09 MED ORDER — OXCARBAZEPINE 300 MG PO TABS: 600.0000 mg | ORAL_TABLET | ORAL | Status: DC

## 2021-01-09 MED ORDER — VANCOMYCIN HCL 2000 MG/400ML IV SOLN
2000.0000 mg | Freq: Once | INTRAVENOUS | Status: AC
  Administered 2021-01-09: 2000 mg via INTRAVENOUS
  Filled 2021-01-09: qty 400

## 2021-01-09 MED ORDER — LISINOPRIL 20 MG PO TABS
20.0000 mg | ORAL_TABLET | Freq: Every day | ORAL | Status: DC
  Administered 2021-01-09 – 2021-01-10 (×2): 20 mg via ORAL
  Filled 2021-01-09 (×2): qty 1

## 2021-01-09 MED ORDER — ACETAMINOPHEN 325 MG PO TABS
650.0000 mg | ORAL_TABLET | Freq: Four times a day (QID) | ORAL | Status: DC | PRN
  Administered 2021-01-09 – 2021-01-10 (×3): 650 mg via ORAL
  Filled 2021-01-09 (×3): qty 2

## 2021-01-09 MED ORDER — MYCOPHENOLATE MOFETIL 250 MG PO CAPS
1500.0000 mg | ORAL_CAPSULE | Freq: Two times a day (BID) | ORAL | Status: DC
  Filled 2021-01-09: qty 6

## 2021-01-09 MED ORDER — SODIUM CHLORIDE 0.9 % IV SOLN
2.0000 g | Freq: Once | INTRAVENOUS | Status: AC
  Administered 2021-01-09: 2 g via INTRAVENOUS
  Filled 2021-01-09: qty 2

## 2021-01-09 MED ORDER — MORPHINE SULFATE (PF) 2 MG/ML IV SOLN
2.0000 mg | INTRAVENOUS | Status: DC | PRN
  Administered 2021-01-09 – 2021-01-10 (×6): 2 mg via INTRAVENOUS
  Filled 2021-01-09 (×6): qty 1

## 2021-01-09 MED ORDER — BUPROPION HCL ER (XL) 300 MG PO TB24
300.0000 mg | ORAL_TABLET | Freq: Every day | ORAL | Status: DC
  Administered 2021-01-09 – 2021-01-10 (×2): 300 mg via ORAL
  Filled 2021-01-09 (×2): qty 1

## 2021-01-09 MED ORDER — OXYCODONE HCL 5 MG PO TABS
10.0000 mg | ORAL_TABLET | ORAL | Status: DC | PRN
  Administered 2021-01-09 – 2021-01-10 (×2): 10 mg via ORAL
  Filled 2021-01-09 (×2): qty 2

## 2021-01-09 MED ORDER — HYDROCODONE-ACETAMINOPHEN 5-325 MG PO TABS
1.0000 | ORAL_TABLET | Freq: Four times a day (QID) | ORAL | Status: DC | PRN
  Administered 2021-01-09: 1 via ORAL
  Filled 2021-01-09: qty 1

## 2021-01-09 MED ORDER — ARFORMOTEROL TARTRATE 15 MCG/2ML IN NEBU
15.0000 ug | INHALATION_SOLUTION | Freq: Two times a day (BID) | RESPIRATORY_TRACT | Status: DC
  Administered 2021-01-09 – 2021-01-10 (×2): 15 ug via RESPIRATORY_TRACT
  Filled 2021-01-09 (×3): qty 2

## 2021-01-09 MED ORDER — DULOXETINE HCL 60 MG PO CPEP
60.0000 mg | ORAL_CAPSULE | Freq: Two times a day (BID) | ORAL | Status: DC
  Administered 2021-01-09 – 2021-01-10 (×3): 60 mg via ORAL
  Filled 2021-01-09 (×3): qty 1

## 2021-01-09 MED ORDER — SODIUM CHLORIDE 0.9 % IV SOLN
500.0000 mg | INTRAVENOUS | Status: DC
  Administered 2021-01-09 – 2021-01-10 (×2): 500 mg via INTRAVENOUS
  Filled 2021-01-09 (×2): qty 500

## 2021-01-09 MED ORDER — ONDANSETRON HCL 4 MG/2ML IJ SOLN: 4.0000 mg | Freq: Four times a day (QID) | INTRAMUSCULAR | Status: DC | PRN

## 2021-01-09 MED ORDER — SODIUM CHLORIDE 0.9 % IV SOLN
2.0000 g | INTRAVENOUS | Status: DC
  Administered 2021-01-09 – 2021-01-10 (×2): 2 g via INTRAVENOUS
  Filled 2021-01-09 (×2): qty 20

## 2021-01-09 MED ORDER — FLUTICASONE PROPIONATE 50 MCG/ACT NA SUSP
2.0000 | Freq: Every day | NASAL | Status: DC
  Administered 2021-01-09 – 2021-01-10 (×2): 2 via NASAL
  Filled 2021-01-09: qty 16

## 2021-01-09 MED ORDER — OXCARBAZEPINE 300 MG PO TABS
600.0000 mg | ORAL_TABLET | Freq: Every day | ORAL | Status: DC
  Administered 2021-01-09 – 2021-01-10 (×2): 600 mg via ORAL
  Filled 2021-01-09 (×2): qty 2

## 2021-01-09 MED ORDER — TRAZODONE HCL 50 MG PO TABS
75.0000 mg | ORAL_TABLET | Freq: Every day | ORAL | Status: DC
  Administered 2021-01-09: 75 mg via ORAL
  Filled 2021-01-09: qty 2

## 2021-01-09 MED ORDER — LURASIDONE HCL 40 MG PO TABS
40.0000 mg | ORAL_TABLET | Freq: Every day | ORAL | Status: DC
  Administered 2021-01-09: 40 mg via ORAL
  Filled 2021-01-09 (×2): qty 1

## 2021-01-09 MED ORDER — OXCARBAZEPINE 300 MG PO TABS
900.0000 mg | ORAL_TABLET | Freq: Every day | ORAL | Status: DC
  Administered 2021-01-09: 900 mg via ORAL
  Filled 2021-01-09 (×2): qty 3

## 2021-01-09 MED ORDER — IOHEXOL 350 MG/ML SOLN
75.0000 mL | Freq: Once | INTRAVENOUS | Status: AC | PRN
  Administered 2021-01-09: 75 mL via INTRAVENOUS

## 2021-01-09 MED ORDER — DILTIAZEM HCL ER COATED BEADS 180 MG PO CP24
180.0000 mg | ORAL_CAPSULE | Freq: Every day | ORAL | Status: DC
  Administered 2021-01-09 – 2021-01-10 (×2): 180 mg via ORAL
  Filled 2021-01-09 (×2): qty 1

## 2021-01-09 MED ORDER — ASPIRIN EC 81 MG PO TBEC
81.0000 mg | DELAYED_RELEASE_TABLET | Freq: Every day | ORAL | Status: DC
  Administered 2021-01-09 – 2021-01-10 (×2): 81 mg via ORAL
  Filled 2021-01-09 (×2): qty 1

## 2021-01-09 MED ORDER — TRAMADOL HCL 50 MG PO TABS
100.0000 mg | ORAL_TABLET | Freq: Every day | ORAL | Status: DC
  Administered 2021-01-09: 100 mg via ORAL
  Filled 2021-01-09: qty 2

## 2021-01-09 MED ORDER — ENOXAPARIN SODIUM 80 MG/0.8ML IJ SOSY
70.0000 mg | PREFILLED_SYRINGE | INTRAMUSCULAR | Status: DC
  Administered 2021-01-09 – 2021-01-10 (×2): 70 mg via SUBCUTANEOUS
  Filled 2021-01-09 (×2): qty 0.8

## 2021-01-09 MED ORDER — GABAPENTIN 300 MG PO CAPS
900.0000 mg | ORAL_CAPSULE | Freq: Every day | ORAL | Status: DC
  Administered 2021-01-09: 900 mg via ORAL
  Filled 2021-01-09: qty 3

## 2021-01-09 NOTE — Progress Notes (Signed)
Pt refused his breathing tx and cpap. He said he is trying to sleep and he does not want to use cpap here.

## 2021-01-09 NOTE — Progress Notes (Signed)
Methotrexate (Trexall; Rheumatrex) hold criteria Hgb < 8 WBC < 3 Pltc < 100K SCr > 1.5x baseline (or > 2 if baseline unknown) AST or ALT >3x ULN Bili > 1.5x ULN Ascites or pleural effusion Diarrhea - Grade 2 or higher Ulcerative stomatitis Unexplained pneumonitis / hypoxemia Active infection   Hold Methotrexate today for active infection (PNA). Consider resume next week if clinically improved.    Netta Cedars, PharmD, BCPS 01/09/2021@6 :37 AM

## 2021-01-09 NOTE — Progress Notes (Signed)
A consult was received from an ED physician for Vancomycin per pharmacy dosing.  The patient's profile has been reviewed for ht/wt/allergies/indication/available labs.   A one time order has been placed for Vancomycin 2gm & Cefepime 2gm IV.  Further antibiotics/pharmacy consults should be ordered by admitting physician if indicated.                       Thank you, Netta Cedars PharmD 01/09/2021  1:03 AM

## 2021-01-09 NOTE — ED Provider Notes (Signed)
[facility] DEPT Provider Note   CSN: [id number] Arrival date & time: 01/08/21  1828     History No chief complaint on file.   Mario [name] is a 70 y.o. male.  HPI Patient is a 70 year old male with a past medical history significant for CAD with MI, OSA, pneumothorax, COPD, bronchitis, anxiety, depression, small cell lung cancer undergoing radiation therapy treatment through September 2022  Patient informs me that he is experiencing more coughing than usual but it seems that his primary concern is rib cage pain partially from coughing and also back pain.  Some of this pain is chronic.  Patient states over that he is coughing more also having severe rib pain and back pain with coughing and also feeling short of breath.  He states that he is prescribed 3 L of oxygen to use as needed at home however he states he does not use it very often.  Denies any hemoptysis he states that occasionally his pain is severe enough that it causes him to feel somewhat lightheaded.  He has not had any syncope or near syncope however.  Also is complaining of chest pain that is achy constant sternal nonradiating does seem to be worse with exertion occasionally.  Also seems to have a pleuritic component     Past Medical History:  Diagnosis Date   Anxiety    Bronchitis    COPD (chronic obstructive pulmonary disease) ([place])    Depression    History of radiation therapy    Left lung- 10/05/20-10/15/20- Dr. Gery Pray   Hypertension    Mental disorder    MI (myocardial infarction) [facility])    ????   OSA (obstructive sleep apnea)    Suicide attempt ([place])    Tension pneumothorax 06/27/2016   Uveitis     Patient Active Problem List   Diagnosis Date Noted   HCAP (healthcare-associated pneumonia) 01/09/2021   Non-small cell cancer of left lung ([place]) 09/21/2020   Lung nodule 07/30/2020   Mass of left lung    Acute respiratory failure with hypoxemia ([place]) 07/26/2020    Constipation    CAP (community acquired pneumonia) 04/03/2020   COPD with acute exacerbation ([place]) 01/05/2020   Vertigo 12/26/2019   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   Stage 3a chronic kidney disease ([place]) 12/26/2019   Abnormal CT of the chest 12/26/2019   COPD (chronic obstructive pulmonary disease) ([place])    AKI (acute kidney injury) ([place])    Hyperglycemia 06/23/2019   OSA (obstructive sleep apnea) 06/23/2019   Suicidal ideation 06/23/2019   Major depressive disorder, recurrent episode ([place]) 10/12/2018   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   Cellulitis of both feet 07/16/2017   DM2 (diabetes mellitus, type 2) ([place]) 07/16/2017   HLA B27 (HLA B27 positive) 07/16/2017   Acute chest pain    Hyperkalemia    Spontaneous pneumothorax 06/27/2016   Hypoxia    Pneumothorax on left    Dyspnea 05/23/2014   COPD exacerbation ([place]) 05/23/2014   Malingering 01/21/2013   Depression 01/11/2013   Tobacco abuse 01/11/2013   Obesity, unspecified 01/11/2013   Chest pain 01/10/2013   HTN (hypertension) 01/10/2013    Past Surgical History:  Procedure Laterality Date   BRONCHIAL BIOPSY  07/30/2020   Procedure: BRONCHIAL BIOPSIES;  Surgeon: Garner Nash, DO;  Location: [facility];  Service: Pulmonary;;   BRONCHIAL BRUSHINGS  07/30/2020   Procedure: BRONCHIAL BRUSHINGS;  Surgeon: Garner Nash, DO;  Location:  [facility];  Service: Pulmonary;;   BRONCHIAL NEEDLE ASPIRATION BIOPSY  07/30/2020   Procedure: BRONCHIAL NEEDLE ASPIRATION BIOPSIES;  Surgeon: Garner Nash, DO;  Location: [facility];  Service: Pulmonary;;   BRONCHIAL WASHINGS  07/30/2020   Procedure: BRONCHIAL WASHINGS;  Surgeon: Garner Nash, DO;  Location: [facility];  Service: Pulmonary;;   CHEST TUBE INSERTION Left 06/27/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     SKIN CANCER EXCISION     VIDEO BRONCHOSCOPY WITH ENDOBRONCHIAL NAVIGATION Left 07/30/2020   Procedure: VIDEO BRONCHOSCOPY WITH  ENDOBRONCHIAL NAVIGATION;  Surgeon: Garner Nash, DO;  Location: [facility];  Service: Pulmonary;  Laterality: Left;       Family History  Problem Relation Age of Onset   Dementia Father     Social History   Tobacco Use   Smoking status: Former    Packs/day: 1.00    Years: 35.00    Pack years: 35.00    Types: Cigarettes    Quit date: 05/2016    Years since quitting: 4.6   Smokeless tobacco: Never  Vaping Use   Vaping Use: Never used  Substance Use Topics   Alcohol use: No    Alcohol/week: 0.0 standard drinks    Comment: denies use of any drugs or alcohol   Drug use: No    Home Medications Prior to Admission medications   Medication Sig Start Date End Date Taking? Authorizing Provider  acetaminophen (TYLENOL) 500 MG tablet Take 1,000 mg by mouth every 6 (six) hours as needed for moderate pain.   Yes [provider]  albuterol (PROVENTIL) (2.5 MG/3ML) 0.083% nebulizer solution Inhale 3 mLs (2.5 mg total) by nebulization every 4 (four) hours as needed for wheezing or shortness of breath. 08/04/20 08/04/21 Yes Lavina Hamman, MD  albuterol (VENTOLIN HFA) 108 (90 Base) MCG/ACT inhaler Inhale 2 puffs into the lungs every 6 (six) hours as needed for wheezing. 06/02/20  Yes [provider]  aspirin 81 MG EC tablet Take 81 mg by mouth daily.   Yes [provider]  b complex vitamins capsule Take 1 capsule by mouth daily.   Yes [provider]  bisacodyl (DULCOLAX) 5 MG EC tablet Take 1 tablet (5 mg total) by mouth daily at 12 noon. Patient taking differently: Take 5 mg by mouth daily. 08/04/20  Yes Lavina Hamman, MD  buPROPion (WELLBUTRIN XL) 300 MG 24 hr tablet Take 300 mg by mouth daily. 02/05/20  Yes [provider]  busPIRone (BUSPAR) 15 MG tablet Take 15 mg by mouth daily.   Yes [provider]  calcium-vitamin D (OSCAL WITH D) 500-200 MG-UNIT tablet Take 1 tablet by mouth 2 (two) times daily with a meal.   Yes [provider]  Cholecalciferol (VITAMIN D-3) 25 MCG (1000 UT) CAPS Take 1,000 Units by mouth daily.   Yes [provider]  cyclobenzaprine (FLEXERIL) 10 MG tablet Take 20 mg by mouth at bedtime.   Yes [provider]  diltiazem (CARDIZEM CD) 180 MG 24 hr capsule Take 180 mg by mouth daily.   Yes [provider]  docusate sodium (COLACE) 100 MG capsule Take 2 capsules (200 mg total) by mouth 2 (two) times daily. Patient taking differently: Take 200 mg by mouth daily. 08/04/20  Yes Lavina Hamman, MD  DULoxetine (CYMBALTA) 60 MG capsule Take 1 capsule (60 mg total) by mouth 2 (two) times daily. 07/21/17  Yes Lavina Hamman, MD  feeding supplement (ENSURE ENLIVE / ENSURE PLUS) LIQD  Take 237 mLs by mouth 3 (three) times daily between meals. Patient taking differently: Take 237 mLs by mouth daily. 08/04/20  Yes Lavina Hamman, MD  fluticasone (FLONASE) 50 MCG/ACT nasal spray Place 2 sprays into both nostrils daily.    Yes [provider]  folic acid (FOLVITE) 1 MG tablet Take 1 mg by mouth daily.   Yes [provider]  gabapentin (NEURONTIN) 300 MG capsule Take 900 mg by mouth at bedtime.   Yes [provider]  Garlic Oil 3 MG CAPS Take 3 mg by mouth daily.   Yes [provider]  HYDROcodone-acetaminophen (NORCO) 5-325 MG tablet Take 1-2 tablets by mouth every 6 (six) hours as needed. Patient taking differently: Take 1-2 tablets by mouth every 6 (six) hours as needed. 12/31/20  Yes Delo, Nathaneil Canary, MD  lisinopril (ZESTRIL) 20 MG tablet Take 20 mg by mouth daily.   Yes [provider]  lurasidone (LATUDA) 40 MG TABS tablet Take 40 mg by mouth daily after supper.   Yes [provider]  metFORMIN (GLUCOPHAGE) 500 MG tablet Take 500 mg by mouth 2 (two) times daily with a meal.   Yes [provider]  methotrexate (RHEUMATREX) 2.5 MG tablet Take 15 mg by mouth once a week. On Saturday   Yes [provider]   Multiple Vitamin (MULTIVITAMIN WITH MINERALS) TABS tablet Take 1 tablet by mouth daily.   Yes [provider]  mycophenolate (CELLCEPT) 250 MG capsule Take 1,500 mg by mouth 2 (two) times daily.   Yes [provider]  Olodaterol HCl (STRIVERDI RESPIMAT) 2.5 MCG/ACT AERS Inhale 2 puffs into the lungs daily.   Yes [provider]  OVER THE COUNTER MEDICATION Place 1 drop into both ears daily as needed (itching). Miracell ProEar for irritated itchy ears   Yes [provider]  OVER THE COUNTER MEDICATION Take 2 tablets by mouth at bedtime. Pure Zzzz   Yes [provider]  oxcarbazepine (TRILEPTAL) 600 MG tablet Take 600-900 mg by mouth See admin instructions. Take 675m by mouth in the morning then 9021mat bedtime 07/16/20  Yes [provider]  polyethylene glycol (MIRALAX / GLYCOLAX) 17 g packet Take 17 g by mouth daily.   Yes [provider]  polyvinyl alcohol (LIQUIFILM TEARS) 1.4 % ophthalmic solution Place 1 drop into both eyes 4 (four) times daily as needed for dry eyes.   Yes [provider]  Propylene Glycol (SYSTANE BALANCE) 0.6 % SOLN Place 1 drop into both eyes in the morning, at noon, and at bedtime.    Yes [provider]  sodium chloride (OCEAN) 0.65 % SOLN nasal spray Place 2 sprays into both nostrils 4 (four) times daily as needed for congestion.   Yes [provider]  tamsulosin (FLOMAX) 0.4 MG CAPS capsule Take 0.4 mg by mouth daily.   Yes [provider]  traZODone (DESYREL) 50 MG tablet Take 75 mg by mouth at bedtime.   Yes [provider]  VITAMIN A PO Take 1 tablet by mouth daily.   Yes [provider]  vitamin C (ASCORBIC ACID) 500 MG tablet Take 500 mg by mouth 2 (two) times daily.    Yes [provider]  Certolizumab Pegol 2 X 200 MG/ML PSKT Inject into the skin. INJECT 400MG (2 SYRINGES) SUBCUTANEOUSLY EVERY 2 WEEKS FOR 6 WEEKS, THEN INJECT 200MG (1  SYRINGE) EVERY 2 WEEKS --400 MG ON WEEK 0, 2 AND 4 FOR 3 DOSES ONLY, THEN MAINTENANCE:  200 MG  EVERY 2 WEEKS --400 MG ON WEEK 0, 2 AND 4 FOR 3 DOSES ONLY, THEN MAINTENANCE: 200 MG   EVERY 2 WEEKS Patient not taking: Reported on 01/09/2021 08/06/20   [provider]  Melatonin 3 MG TABS Take 6 mg by mouth at bedtime.    [provider]  nitroGLYCERIN (NITROSTAT) 0.4 MG SL tablet Place 1 tablet (0.4 mg total) under the tongue every 5 (five) minutes as needed for chest pain. 07/03/19   Danford, Suann Larry, MD  PRESCRIPTION MEDICATION Inhale into the lungs See admin instructions. CPAP- At bedtime and during during any naps    [provider]  traMADol (ULTRAM) 50 MG tablet Take 100 mg by mouth at bedtime.    [provider]    Allergies    Demerol [meperidine] and Zocor [simvastatin]  Review of Systems   Review of Systems  Constitutional:  Positive for fatigue. Negative for chills and fever.  HENT:  Negative for congestion.   Eyes:  Negative for pain.  Respiratory:  Positive for shortness of breath. Negative for cough.   Cardiovascular:  Positive for chest pain. Negative for leg swelling.  Gastrointestinal:  Negative for abdominal pain and vomiting.  Genitourinary:  Negative for dysuria.  Musculoskeletal:  Positive for back pain and myalgias.  Skin:  Negative for rash.  Neurological:  Negative for dizziness and headaches.   Physical Exam Updated Vital Signs BP (!) 154/102 (BP Location: Left Wrist)   Pulse 92   Temp 98.1 F (36.7 C) (Oral)   Resp 18   SpO2 95%   Physical Exam Vitals and nursing note reviewed.  Constitutional:      General: He is not in acute distress.    Comments: Uncomfortable appearing 70 year old male Nontoxic-appearing Appears chronically ill  HENT:     Head: Normocephalic and atraumatic.     Nose: Nose normal.  Eyes:     General: No scleral icterus. Cardiovascular:     Rate and Rhythm: Normal rate and regular rhythm.      Pulses: Normal pulses.     Heart sounds: Normal heart sounds.  Pulmonary:     Effort: No respiratory distress.     Breath sounds: No wheezing.     Comments: Mild tachypnea, lungs without any auscultated wheezing or crackles.  SPO2 91% on room air Abdominal:     Palpations: Abdomen is soft.     Tenderness: There is no abdominal tenderness. There is no rebound.  Musculoskeletal:     Cervical back: Normal range of motion.     Right lower leg: No edema.     Left lower leg: No edema.     Comments: Diffuse mid thoracic and lumbar tenderness to palpation There is also some paravertebral muscular tenderness.  Skin:    General: Skin is warm and dry.     Capillary Refill: Capillary refill takes less than 2 seconds.  Neurological:     Mental Status: He is alert. Mental status is at baseline.  Psychiatric:        Mood and Affect: Mood normal.        Behavior: Behavior normal.    ED Results / Procedures / Treatments   Labs (all labs ordered are listed, but only abnormal results are displayed) Labs Reviewed  CBC WITH DIFFERENTIAL/PLATELET - Abnormal; Notable for the following components:      Result Value   WBC 13.0 (*)    Neutro Abs 9.9 (*)    Abs Immature Granulocytes  0.08 (*)    All other components within normal limits  COMPREHENSIVE METABOLIC PANEL - Abnormal; Notable for the following components:   Sodium 132 (*)    Glucose, Bld 209 (*)    All other components within normal limits  MRSA NEXT GEN BY PCR, NASAL  CULTURE, BLOOD (ROUTINE X 2)  CULTURE, BLOOD (ROUTINE X 2)  RESP PANEL BY RT-PCR (FLU A&B, COVID) ARPGX2  LIPASE, BLOOD  URINALYSIS, ROUTINE W REFLEX MICROSCOPIC  LACTIC ACID, PLASMA  TROPONIN I (HIGH SENSITIVITY)  TROPONIN I (HIGH SENSITIVITY)    EKG None  Radiology DG Chest 2 View  Result Date: 01/08/2021 CLINICAL DATA:  Chest pain and shortness of breath. EXAM: CHEST - 2 VIEW COMPARISON:  Chest x-ray 11/08/2020. CT chest abdomen and pelvis 12/31/2020.  FINDINGS: The cardiomediastinal silhouette is within normal limits. There are patchy airspace opacities in the left lung base increased from prior exam. Bullae in the lung bases are stable from prior study. There is no pleural effusion or pneumothorax. There are no acute fractures. IMPRESSION: 1. Patchy left lower lobe airspace disease worrisome for infection. 2.  Emphysema (ICD10-J43.9). Electronically Signed   By: Ronney Asters M.D.   On: 01/08/2021 23:36   CT Angio Chest PE W and/or Wo Contrast  Result Date: 01/09/2021 CLINICAL DATA:  Chest/back pain EXAM: CT ANGIOGRAPHY CHEST WITH CONTRAST TECHNIQUE: Multidetector CT imaging of the chest was performed using the standard protocol during bolus administration of intravenous contrast. Multiplanar CT image reconstructions and MIPs were obtained to evaluate the vascular anatomy. CONTRAST:  29m OMNIPAQUE IOHEXOL 350 MG/ML SOLN COMPARISON:  Chest radiographs dated 01/08/2021. CT chest dated 12/31/2020. FINDINGS: Cardiovascular: Satisfactory opacification the bilateral pulmonary arteries to the segmental level. No evidence of pulmonary embolism. Although not tailored for evaluation of the thoracic aorta, there is no evidence of thoracic aneurysm or dissection. The heart is normal in size.  No pericardial effusion. Three vessel coronary atherosclerosis. Mediastinum/Nodes: No suspicious mediastinal lymphadenopathy. Visualized thyroid is unremarkable. Lungs/Pleura: Evaluation of the lung parenchyma is constrained by respiratory motion. 3.6 x 3.1 cm mass in the left lower lobe (series 6/image 112), poorly evaluated on the current study, corresponding to the patient's known primary bronchogenic neoplasm. Moderate emphysematous changes with bullous changes in the bilateral lower lungs. Small left pleural effusion. No pneumothorax. Upper Abdomen: Visualized upper abdomen is grossly unremarkable. Musculoskeletal: Degenerative changes of the visualized thoracolumbar spine.  Review of the MIP images confirms the above findings. IMPRESSION: No evidence of pulmonary embolism. 3.6 cm mass in the left lower lobe, poorly evaluated on the current study due to motion degradation, but corresponding to the patient's known primary bronchogenic neoplasm. Small left pleural effusion. Aortic Atherosclerosis (ICD10-I70.0) and Emphysema (ICD10-J43.9). Electronically Signed   By: SJulian HyM.D.   On: 01/09/2021 03:51    Procedures Procedures   Medications Ordered in ED Medications  ondansetron (ZOFRAN) injection 4 mg (4 mg Intravenous Given 01/08/21 2306)  morphine 4 MG/ML injection 4 mg (4 mg Intravenous Given 01/08/21 2307)  ceFEPIme (MAXIPIME) 2 g in sodium chloride 0.9 % 100 mL IVPB (0 g Intravenous Stopped 01/09/21 0145)  vancomycin (VANCOREADY) IVPB 2000 mg/400 mL (2,000 mg Intravenous New Bag/Given 01/09/21 0236)  iohexol (OMNIPAQUE) 350 MG/ML injection 75 mL (75 mLs Intravenous Contrast Given 01/09/21 0320)    ED Course  I have reviewed the triage vital signs and the nursing notes.  Pertinent labs & imaging results that were available during my care of the patient were reviewed by me  and considered in my medical decision making (see chart for details).    MDM Rules/Calculators/A&P                          Patient is a 70 year old gentleman presented to the ER today with his third presentation to the emergency room within the past couple weeks for similar symptoms.  He has a history of lung cancer and is currently undergoing radiation therapy for this.  He is complaining of some chest pain which seems to be exertional and pleuritic.  The pleuritic component certainly merits consideration of radiation related pain or perhaps related to his lung tumor  Some of his pain certainly seems chronic.  However he is mildly tachycardic on my initial evaluation and complaining of pleuritic sharp chest pain does have a history of cancer obese and is 70 years old this makes  him relatively high risk for pulmonary embolism  Patient's x-ray is notable for a patchy left lower lobe airspace disease consistent with pneumonia patient has been in and out of emergency room multiple times making HCAP a possibility discussed with my attending physician who recommended empiric treatment of HCAP, blood cultures, admission  CMP unremarkable apart from hyperglycemia and mild hyponatremia.  CBC with leukocytosis with left shift.  Urinalysis unremarkable blood cultures pending.  Lactic within normal limits.  COVID influenza pending.  Troponin x2 within normal limits.  EKG nonischemic  Discussed with Dr. Marlowe Sax of hospitalist service who will admit for HCAP, chest pain. I plan to follow-up on PE study--this was negative for PE.  Will not heparinize.  Patient is admitted to hospital service.  KRISTINA BERTONE was evaluated in Emergency Department on 01/09/2021 for the symptoms described in the history of present illness. He was evaluated in the context of the global COVID-19 pandemic, which necessitated consideration that the patient might be at risk for infection with the SARS-CoV-2 virus that causes COVID-19. Institutional protocols and algorithms that pertain to the evaluation of patients at risk for COVID-19 are in a state of rapid change based on information released by regulatory bodies including the CDC and federal and state organizations. These policies and algorithms were followed during the patient's care in the ED.   Final Clinical Impression(s) / ED Diagnoses Final diagnoses:  HCAP (healthcare-associated pneumonia)  Chest pain, unspecified type    Rx / DC Orders ED Discharge Orders     None        Tedd Sias, [place] 01/09/21 0505    Palumbo, April, MD 01/09/21 0532

## 2021-01-09 NOTE — H&P (Addendum)
History and Physical    HADI DUBIN JME:[id number] DOB: 06/18/1950 DOA: 01/08/2021  PCP: [facility] Patient coming from: Home  Chief Complaint: Cough  HPI: Mario [name] is a 70 y.o. male with medical history significant of COPD, chronic hypoxic respiratory failure on 3 L oxygen at baseline, OSA on CPAP, hypertension, anxiety, depression, type 2 diabetes, uveitis, ankylosing spondylitis, non-small cell lung cancer presented to the ED for evaluation of cough, shortness of breath, and pleuritic chest pain.  Oxygen saturation in the upper 80s on room air and placed on 3 L supplemental oxygen.  Not febrile.  Labs showing WBC 13.0, hemoglobin 14.5, platelet count 359k.  Sodium 132, potassium 4.2, chloride 100, bicarb 22, BUN 14, creatinine 1.0, glucose 209.  High-sensitivity troponin negative x2.  Blood cultures drawn.  Lactic acid 1.7.  MRSA PCR negative.  COVID and influenza PCR pending.  Chest x-ray showing patchy left lower lobe airspace disease worrisome for pneumonia.  Patient was given vancomycin and cefepime.  CT angiogram chest subsequently done and came back negative for PE.  Showing a 3.6 cm mass in the left lower lobe, poorly evaluated on the current study due to motion degradation, but corresponding to the patient's known primary bronchogenic neoplasm.  Small left pleural effusion.  Patient states he is coughing for several weeks and his ribs are hurting on both sides.  Cough is nonproductive.  His chest hurts when he takes deep breaths.  States he is always short of breath due to his COPD.  He is using his home inhalers.  He is fully vaccinated against COVID and recently also received a flu shot.  He finished radiation treatment for his lung cancer in September and is supposed to follow-up with his medical oncologist later this month to discuss further treatment plan.  He uses 3 L home oxygen as needed.  He has not received chemotherapy yet.  He is also having mid and lower  back pain which is chronic for which he is being seen at a pain clinic and receiving injections.  No other complaints.  Review of Systems:  All systems reviewed and apart from history of presenting illness, are negative.  Past Medical History:  Diagnosis Date   Anxiety    Bronchitis    COPD (chronic obstructive pulmonary disease) (HCC)    Depression    History of radiation therapy    Left lung- 10/05/20-10/15/20- Dr. Gery Pray   Hypertension    Mental disorder    MI (myocardial infarction) [facility])    ????   OSA (obstructive sleep apnea)    Suicide attempt [facility])    Tension pneumothorax 06/27/2016   Uveitis     Past Surgical History:  Procedure Laterality Date   BRONCHIAL BIOPSY  07/30/2020   Procedure: BRONCHIAL BIOPSIES;  Surgeon: Garner Nash, DO;  Location: [facility];  Service: Pulmonary;;   BRONCHIAL BRUSHINGS  07/30/2020   Procedure: BRONCHIAL BRUSHINGS;  Surgeon: Garner Nash, DO;  Location: [facility];  Service: Pulmonary;;   BRONCHIAL NEEDLE ASPIRATION BIOPSY  07/30/2020   Procedure: BRONCHIAL NEEDLE ASPIRATION BIOPSIES;  Surgeon: Garner Nash, DO;  Location: [facility];  Service: Pulmonary;;   BRONCHIAL WASHINGS  07/30/2020   Procedure: BRONCHIAL WASHINGS;  Surgeon: Garner Nash, DO;  Location: [facility];  Service: Pulmonary;;   CHEST TUBE INSERTION Left 06/27/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     SKIN CANCER EXCISION     VIDEO BRONCHOSCOPY WITH ENDOBRONCHIAL NAVIGATION Left 07/30/2020   Procedure: VIDEO  BRONCHOSCOPY WITH ENDOBRONCHIAL NAVIGATION;  Surgeon: Garner Nash, DO;  Location: [facility];  Service: Pulmonary;  Laterality: Left;     reports that he quit smoking about 4 years ago. His smoking use included cigarettes. He has a 35.00 pack-year smoking history. He has never used smokeless tobacco. He reports that he does not drink alcohol and does not use drugs.  Allergies  Allergen Reactions   Demerol [Meperidine] Nausea And Vomiting and Other (See  Comments)    Made the patient "violently sick"   Zocor [Simvastatin] Nausea And Vomiting and Other (See Comments)    Made him very jittery, also    Family History  Problem Relation Age of Onset   Dementia Father     Prior to Admission medications   Medication Sig Start Date End Date Taking? Authorizing Provider  acetaminophen (TYLENOL) 500 MG tablet Take 1,000 mg by mouth every 6 (six) hours as needed for moderate pain.   Yes [provider]  albuterol (PROVENTIL) (2.5 MG/3ML) 0.083% nebulizer solution Inhale 3 mLs (2.5 mg total) by nebulization every 4 (four) hours as needed for wheezing or shortness of breath. 08/04/20 08/04/21 Yes Lavina Hamman, MD  albuterol (VENTOLIN HFA) 108 (90 Base) MCG/ACT inhaler Inhale 2 puffs into the lungs every 6 (six) hours as needed for wheezing. 06/02/20  Yes [provider]  aspirin 81 MG EC tablet Take 81 mg by mouth daily.   Yes [provider]  b complex vitamins capsule Take 1 capsule by mouth daily.   Yes [provider]  bisacodyl (DULCOLAX) 5 MG EC tablet Take 1 tablet (5 mg total) by mouth daily at 12 noon. Patient taking differently: Take 5 mg by mouth daily. 08/04/20  Yes Lavina Hamman, MD  buPROPion (WELLBUTRIN XL) 300 MG 24 hr tablet Take 300 mg by mouth daily. 02/05/20  Yes [provider]  busPIRone (BUSPAR) 15 MG tablet Take 15 mg by mouth daily.   Yes [provider]  calcium-vitamin D (OSCAL WITH D) 500-200 MG-UNIT tablet Take 1 tablet by mouth 2 (two) times daily with a meal.   Yes [provider]  Cholecalciferol (VITAMIN D-3) 25 MCG (1000 UT) CAPS Take 1,000 Units by mouth daily.   Yes [provider]  cyclobenzaprine (FLEXERIL) 10 MG tablet Take 20 mg by mouth at bedtime.   Yes [provider]  diltiazem (CARDIZEM CD) 180 MG 24 hr capsule Take 180 mg by mouth daily.   Yes [provider]  docusate sodium (COLACE) 100 MG capsule Take 2 capsules (200 mg  total) by mouth 2 (two) times daily. Patient taking differently: Take 200 mg by mouth daily. 08/04/20  Yes Lavina Hamman, MD  DULoxetine (CYMBALTA) 60 MG capsule Take 1 capsule (60 mg total) by mouth 2 (two) times daily. 07/21/17  Yes Lavina Hamman, MD  feeding supplement (ENSURE ENLIVE / ENSURE PLUS) LIQD Take 237 mLs by mouth 3 (three) times daily between meals. Patient taking differently: Take 237 mLs by mouth daily. 08/04/20  Yes Lavina Hamman, MD  fluticasone (FLONASE) 50 MCG/ACT nasal spray Place 2 sprays into both nostrils daily.    Yes [provider]  folic acid (FOLVITE) 1 MG tablet Take 1 mg by mouth daily.   Yes [provider]  gabapentin (NEURONTIN) 300 MG capsule Take 900 mg by mouth at bedtime.   Yes [provider]  Garlic Oil 3 MG CAPS Take 3 mg by mouth daily.   Yes  [provider]  HYDROcodone-acetaminophen (NORCO) 5-325 MG tablet Take 1-2 tablets by mouth every 6 (six) hours as needed. Patient taking differently: Take 1-2 tablets by mouth every 6 (six) hours as needed. 12/31/20  Yes Delo, Nathaneil Canary, MD  lisinopril (ZESTRIL) 20 MG tablet Take 20 mg by mouth daily.   Yes [provider]  lurasidone (LATUDA) 40 MG TABS tablet Take 40 mg by mouth daily after supper.   Yes [provider]  metFORMIN (GLUCOPHAGE) 500 MG tablet Take 500 mg by mouth 2 (two) times daily with a meal.   Yes [provider]  methotrexate (RHEUMATREX) 2.5 MG tablet Take 15 mg by mouth once a week. On Saturday   Yes [provider]  Multiple Vitamin (MULTIVITAMIN WITH MINERALS) TABS tablet Take 1 tablet by mouth daily.   Yes [provider]  mycophenolate (CELLCEPT) 250 MG capsule Take 1,500 mg by mouth 2 (two) times daily.   Yes [provider]  Olodaterol HCl (STRIVERDI RESPIMAT) 2.5 MCG/ACT AERS Inhale 2 puffs into the lungs daily.   Yes [provider]  OVER THE COUNTER MEDICATION Place 1 drop into both ears  daily as needed (itching). Miracell ProEar for irritated itchy ears   Yes [provider]  OVER THE COUNTER MEDICATION Take 2 tablets by mouth at bedtime. Pure Zzzz   Yes [provider]  oxcarbazepine (TRILEPTAL) 600 MG tablet Take 600-900 mg by mouth See admin instructions. Take 600mg  by mouth in the morning then 900mg  at bedtime 07/16/20  Yes [provider]  polyethylene glycol (MIRALAX / GLYCOLAX) 17 g packet Take 17 g by mouth daily.   Yes [provider]  polyvinyl alcohol (LIQUIFILM TEARS) 1.4 % ophthalmic solution Place 1 drop into both eyes 4 (four) times daily as needed for dry eyes.   Yes [provider]  Propylene Glycol (SYSTANE BALANCE) 0.6 % SOLN Place 1 drop into both eyes in the morning, at noon, and at bedtime.    Yes [provider]  sodium chloride (OCEAN) 0.65 % SOLN nasal spray Place 2 sprays into both nostrils 4 (four) times daily as needed for congestion.   Yes [provider]  tamsulosin (FLOMAX) 0.4 MG CAPS capsule Take 0.4 mg by mouth daily.   Yes [provider]  traZODone (DESYREL) 50 MG tablet Take 75 mg by mouth at bedtime.   Yes [provider]  VITAMIN A PO Take 1 tablet by mouth daily.   Yes [provider]  vitamin C (ASCORBIC ACID) 500 MG tablet Take 500 mg by mouth 2 (two) times daily.    Yes [provider]  Certolizumab Pegol 2 X 200 MG/ML PSKT Inject into the skin. INJECT 400MG  (2 SYRINGES) SUBCUTANEOUSLY EVERY 2 WEEKS FOR 6 WEEKS, THEN INJECT 200MG  (1 SYRINGE) EVERY 2 WEEKS --400 MG ON WEEK 0, 2 AND 4 FOR 3 DOSES ONLY, THEN MAINTENANCE: 200 MG  EVERY 2 WEEKS --400 MG ON WEEK 0, 2 AND 4 FOR 3 DOSES ONLY, THEN MAINTENANCE: 200 MG   EVERY 2 WEEKS Patient not taking: Reported on 01/09/2021 08/06/20   [provider]  Melatonin 3 MG TABS Take 6 mg by mouth at bedtime.    [provider]  nitroGLYCERIN (NITROSTAT) 0.4 MG SL tablet Place 1 tablet (0.4 mg  total) under the tongue every 5 (five) minutes as needed for chest pain. 07/03/19   Danford, Suann Larry, MD  PRESCRIPTION MEDICATION Inhale into the lungs See admin instructions. CPAP- At  bedtime and during during any naps    [provider]  traMADol (ULTRAM) 50 MG tablet Take 100 mg by mouth at bedtime.    [provider]    Physical Exam: Vitals:   01/09/21 0415 01/09/21 0416 01/09/21 0443 01/09/21 0512  BP: (!) 168/102 (!) 168/102 (!) 154/102   Pulse: 97 95 92   Resp:  20 18   Temp:  98.1 F (36.7 C) 98.1 F (36.7 C)   TempSrc:  Oral Oral   SpO2: 93% 93% 95%   Weight:    (!) 152 kg  Height:    6\' 2"  (1.88 m)    Physical Exam Constitutional:      General: He is not in acute distress. HENT:     Head: Normocephalic and atraumatic.  Eyes:     Extraocular Movements: Extraocular movements intact.     Conjunctiva/sclera: Conjunctivae normal.  Cardiovascular:     Rate and Rhythm: Normal rate and regular rhythm.     Pulses: Normal pulses.  Pulmonary:     Effort: Pulmonary effort is normal. No respiratory distress.     Breath sounds: No wheezing or rales.  Abdominal:     General: Bowel sounds are normal.     Palpations: Abdomen is soft.     Tenderness: There is no abdominal tenderness. There is no guarding.  Musculoskeletal:        General: No swelling or tenderness.     Cervical back: Normal range of motion and neck supple.  Skin:    General: Skin is warm and dry.  Neurological:     General: No focal deficit present.     Mental Status: He is alert and oriented to person, place, and time.     Labs on Admission: I have personally reviewed following labs and imaging studies  CBC: Recent Labs  Lab 01/08/21 1915  WBC 13.0*  NEUTROABS 9.9*  HGB 14.5  HCT 42.5  MCV 96.8  PLT 073   Basic Metabolic Panel: Recent Labs  Lab 01/08/21 1915  NA 132*  K 4.2  CL 100  CO2 22  GLUCOSE 209*  BUN 14  CREATININE 1.00  CALCIUM 9.2   GFR: Estimated  Creatinine Clearance: 108.6 mL/min (by C-G formula based on SCr of 1 mg/dL). Liver Function Tests: Recent Labs  Lab 01/08/21 1915  AST 25  ALT 37  ALKPHOS 64  BILITOT 0.4  PROT 7.2  ALBUMIN 4.3   Recent Labs  Lab 01/08/21 1915  LIPASE 36   No results for input(s): AMMONIA in the last 168 hours. Coagulation Profile: No results for input(s): INR, PROTIME in the last 168 hours. Cardiac Enzymes: No results for input(s): CKTOTAL, CKMB, CKMBINDEX, TROPONINI in the last 168 hours. BNP (last 3 results) No results for input(s): PROBNP in the last 8760 hours. HbA1C: No results for input(s): HGBA1C in the last 72 hours. CBG: No results for input(s): GLUCAP in the last 168 hours. Lipid Profile: No results for input(s): CHOL, HDL, LDLCALC, TRIG, CHOLHDL, LDLDIRECT in the last 72 hours. Thyroid Function Tests: No results for input(s): TSH, T4TOTAL, FREET4, T3FREE, THYROIDAB in the last 72 hours. Anemia Panel: No results for input(s): VITAMINB12, FOLATE, FERRITIN, TIBC, IRON, RETICCTPCT in the last 72 hours. Urine analysis:    Component Value Date/Time   COLORURINE YELLOW 01/08/2021 [place] 01/08/2021 2320   LABSPEC 1.019 01/08/2021 2320   PHURINE 6.0 01/08/2021 2320   GLUCOSEU NEGATIVE 01/08/2021 2320   HGBUR NEGATIVE 01/08/2021  [place] 01/08/2021 [place] 01/08/2021 2320   PROTEINUR NEGATIVE 01/08/2021 2320   UROBILINOGEN 1.0 01/12/2013 0024   NITRITE NEGATIVE 01/08/2021 2320   LEUKOCYTESUR NEGATIVE 01/08/2021 2320    Radiological Exams on Admission: DG Chest 2 View  Result Date: 01/08/2021 CLINICAL DATA:  Chest pain and shortness of breath. EXAM: CHEST - 2 VIEW COMPARISON:  Chest x-ray 11/08/2020. CT chest abdomen and pelvis 12/31/2020. FINDINGS: The cardiomediastinal silhouette is within normal limits. There are patchy airspace opacities in the left lung base increased from prior exam. Bullae in the lung bases are  stable from prior study. There is no pleural effusion or pneumothorax. There are no acute fractures. IMPRESSION: 1. Patchy left lower lobe airspace disease worrisome for infection. 2.  Emphysema (ICD10-J43.9). Electronically Signed   By: Ronney Asters M.D.   On: 01/08/2021 23:36   CT Angio Chest PE W and/or Wo Contrast  Result Date: 01/09/2021 CLINICAL DATA:  Chest/back pain EXAM: CT ANGIOGRAPHY CHEST WITH CONTRAST TECHNIQUE: Multidetector CT imaging of the chest was performed using the standard protocol during bolus administration of intravenous contrast. Multiplanar CT image reconstructions and MIPs were obtained to evaluate the vascular anatomy. CONTRAST:  29mL OMNIPAQUE IOHEXOL 350 MG/ML SOLN COMPARISON:  Chest radiographs dated 01/08/2021. CT chest dated 12/31/2020. FINDINGS: Cardiovascular: Satisfactory opacification the bilateral pulmonary arteries to the segmental level. No evidence of pulmonary embolism. Although not tailored for evaluation of the thoracic aorta, there is no evidence of thoracic aneurysm or dissection. The heart is normal in size.  No pericardial effusion. Three vessel coronary atherosclerosis. Mediastinum/Nodes: No suspicious mediastinal lymphadenopathy. Visualized thyroid is unremarkable. Lungs/Pleura: Evaluation of the lung parenchyma is constrained by respiratory motion. 3.6 x 3.1 cm mass in the left lower lobe (series 6/image 112), poorly evaluated on the current study, corresponding to the patient's known primary bronchogenic neoplasm. Moderate emphysematous changes with bullous changes in the bilateral lower lungs. Small left pleural effusion. No pneumothorax. Upper Abdomen: Visualized upper abdomen is grossly unremarkable. Musculoskeletal: Degenerative changes of the visualized thoracolumbar spine. Review of the MIP images confirms the above findings. IMPRESSION: No evidence of pulmonary embolism. 3.6 cm mass in the left lower lobe, poorly evaluated on the current study due to  motion degradation, but corresponding to the patient's known primary bronchogenic neoplasm. Small left pleural effusion. Aortic Atherosclerosis (ICD10-I70.0) and Emphysema (ICD10-J43.9). Electronically Signed   By: Julian Hy M.D.   On: 01/09/2021 03:51    EKG: Independently reviewed.  Sinus rhythm with first-degree AV block, no significant change since prior tracing.  Assessment/Plan Principal Problem:   CAP (community acquired pneumonia) Active Problems:   Depression   DM2 (diabetes mellitus, type 2) (HCC)   OSA (obstructive sleep apnea)   Acute on chronic respiratory failure (HCC)   Acute on chronic hypoxemic respiratory failure secondary to possible community-acquired pneumonia Patient here for evaluation of cough, shortness of breath, and pleuritic chest pain.  Oxygen saturation in the upper 80s on room air.  Currently stable on 2 L supplemental oxygen, uses 3 L at home prn. Labs showing mild leukocytosis.  No fever, lactic acidosis, or other signs of sepsis.  Chest x-ray done initially showing patchy left lower lobe airspace disease worrisome for pneumonia.  Patient was given vancomycin and cefepime in the ED.  MRSA PCR negative. CT angiogram chest subsequently done and came back negative for PE.  Showing a 3.6 cm mass in the left lower lobe, poorly evaluated on the current study due  to motion degradation, but corresponding to the patient's known primary bronchogenic neoplasm.  Small left pleural effusion. -Ceftriaxone and azithromycin.  Check procalcitonin level and monitor WBC count.  Blood cultures pending.  Continue supplemental oxygen. COVID and influenza PCR pending.  COPD Stable.  Not wheezing. -Continue home inhalers.  OSA -Continue nightly CPAP  Hypertension -Continue Cardizem and lisinopril  Noninsulin-dependent type 2 diabetes -Check A1c.  Sliding scale insulin sensitive ACHS.  Non-small cell lung cancer Patient states he has not received chemotherapy yet.   States he finished radiation therapy in September and is supposed to follow-up with his medical oncologist later this month to discuss further treatment plan. -Outpatient oncology follow-up  History of uveitis/ankylosing spondylitis On immunosuppressants. -Hold at this time  Mood disorder -Continue home medications  DVT prophylaxis: Lovenox Code Status: Patient wishes to be full code. Family Communication: No family available at this time. Disposition Plan: Status is: Inpatient  Remains inpatient appropriate because: Acute on chronic hypoxemic respiratory failure, pneumonia  Level of care: Level of care: Telemetry  The medical decision making on this patient was of high complexity and the patient is at high risk for clinical deterioration, therefore this is a level 3 visit.  Shela Leff MD [facility]  If 7PM-7AM, please contact night-coverage [email or web address]  01/09/2021, 6:19 AM

## 2021-01-09 NOTE — Progress Notes (Addendum)
PROGRESS NOTE    Mario [name]  OBS:[id number] DOB: January 20, 1951 DOA: 01/08/2021 PCP: [facility]    Brief Narrative:  Mario [name] is a 70 year old male [employer] veteran with past medical history significant for chronic hypoxic respiratory failure on 3 L nasal cannula at baseline, COPD, OSA on CPAP, essential hypertension, anxiety, depression, type 2 diabetes mellitus, uveitis, ankylosing spondylitis, non-small cell lung cancer on radiation who presents to [facility] ED on 11/11 with cough, shortness of breath and pleuritic chest pain.  Patient reports coughing for several weeks with chest wall pain bilaterally.  Cough is nonproductive.  Worse when takes deep inspiration.  Patient reports is compliant with his home inhalers.  No recent sick contacts.  Also complaining of of chronic mid/lower back pain in which he is being managed at a pain clinic with injections outpatient.  No other complaints.  Patient just completed radiation treatment for his non-small cell lung cancer in September, is followed by radiation oncology Dr. Sondra Come.  Has upcoming appointment with Dr. Earlie Server medical oncology to discuss further treatment.  Has yet to start chemotherapy.  In the ED, temperature 98.3 F, HR 102, RR 20, BP 151/88, SPO2 80% on room air.  Sodium 132, potassium 4.2, chloride 100, CO2 22, glucose 209, BUN 14, creatinine 1.00, lipase 36, AST 25, ALT 37, total bilirubin 0.4.  High sensitive troponin 5.  WBC 13.0, hemoglobin 14.5, platelets 259.  Lactic acid 1.7.  MRSA PCR negative.  Urinalysis unrevealing.  Covid-19 PCR negative.  Influenza A/B PCR negative.  Chest x-ray with patchy left lower lobe airspace disease worrisome for infection, emphysema.  CT angiogram chest with no evidence of pulmonary embolism, 3.6 cm mass left lower lobe, poorly evaluated due to motion degradation but likely secondary to known bronchogenic neoplasm, small left pleural effusion, aortic atherosclerosis and emphysema.  EDP  started patient on vancomycin and cefepime for concerns of pneumonia.  Hospitalist service consulted for further evaluation and management of acute on chronic respiratory failure secondary to pneumonia.   Assessment & Plan:   Principal Problem:   CAP (community acquired pneumonia) Active Problems:   Depression   DM2 (diabetes mellitus, type 2) (HCC)   OSA (obstructive sleep apnea)   Acute on chronic respiratory failure (HCC)   Acute on chronic respiratory failure with hypoxia, POA Community acquired pneumonia Patient presented to the ED with progressive shortness of breath, cough, pleuritic chest pain with noted oxygen saturation in the upper 80s on room air.  Patient at baseline uses 3 L nasal cannula as needed.  WBC count elevated at 13.0 with chest x-ray with left lower lobe airspace disease worrisome for pneumonia.  Patient was initially started on vancomycin and cefepime in the ED.  MRSA PCR negative.  Influenza A/B PCR negative.  COVID-19 PCR negative.  CT angiogram chest negative for PE, does note known 3.6 cm mass left lower lobe.  Suspect atypical versus gram-negative organism. --Blood cultures x2: Pending --Azithromycin 500 mg IV daily x5 days --Ceftriaxone 2 g IV every 24 hours --Continue supple oxygen, maintain SPO2 greater than 88%, on 3 L nasal cannula, (Baseline 3L Halfway PRN)  COPD On Striverdi respimat 2puffs daily at home --Brovana neb twice daily --Albuterol neb every 6 hours as needed wheezing/shortness of breath --Continue supplemental oxygen as above, maintain SPO2 greater than 88%  OSA: Continue nocturnal CPAP  Essential hypertension --Cardizem 180 mg p.o. daily --Lisinopril 20 mg p.o. daily  Type 2 diabetes mellitus On metformin 5 mg p.o. twice daily  at home --Hold oral hypoglycemics while inpatient --SSI for coverage --CBGs qAC/HS  Non-small cell lung cancer CT angiogram chest with known 3.6 cm mass left lower lobe.  Just completed radiation therapy in  September 2022, followed by Dr. Sondra Come.  Has upcoming appointment with medical oncology, Dr. Earlie Server and to discuss chemotherapy options.  History of uveitis/ankylosing spondylitis Chronic pain syndrome --CellCept 1500 mg p.o. twice daily --Tramadol 100 mg p.o. nightly --Oxycodone 10 mg p.o. every 4 hours as needed moderate pain --Gabapentin 900 mg p.o. daily --Outpatient follow-up with pain clinic  Mood disorder --Cymbalta 60 mg p.o. twice daily --Trileptal 600 mg p.o. daily and 900mg  PO qHS --Latuda 40 mg p.o. daily --Wellbutrin 300 mg p.o. daily --BuSpar 50 mg p.o. daily  BPH: Tamsulosin 0.4 mg p.o. daily  Morbid obesity: Body mass index is 43.02 kg/m.  Discussed with patient needs for aggressive lifestyle changes/weight loss as this complicates all facets of care.  Outpatient follow-up with PCP.  May benefit from bariatric evaluation outpatient.  DVT prophylaxis:   Lovenox   Code Status: Full Code Family Communication: No family present at bedside this morning  Disposition Plan:  Level of care: Telemetry Status is: Inpatient  Remains inpatient appropriate because: IV antibiotics      Consultants:  None  Procedures:  None  Antimicrobials:  Vancomycin 11/11 - 11/11 Cefepime 11/11 - 11/11 Azithromycin 11/12>> Ceftriaxone 11/12>>   Subjective: Patient seen examined at bedside, resting comfortably.  Sitting at edge of bed.  States breathing improved.  Complaining of pain to his lower back and chest wall from coughing.  No other specific complaints or concerns at this time.  Denies headache, no fever/chills/night sweats, no nausea/vomiting/diarrhea, no abdominal pain, no weakness, no fatigue, no paresthesias.  No acute events overnight per nursing staff.  Objective: Vitals:   01/09/21 0443 01/09/21 0512 01/09/21 0834 01/09/21 0932  BP: (!) 154/102     Pulse: 92     Resp: 18     Temp: 98.1 F (36.7 C)     TempSrc: Oral     SpO2: 95%  93% 94%  Weight:  (!)  152 kg    Height:  6\' 2"  (1.88 m)      Intake/Output Summary (Last 24 hours) at 01/09/2021 1148 Last data filed at 01/09/2021 0600 Gross per 24 hour  Intake 399.13 ml  Output --  Net 399.13 ml   Filed Weights   01/09/21 0512  Weight: (!) 152 kg    Examination:  General exam: Appears calm and comfortable, obese Respiratory system: Mild diffuse mid to late expiratory wheezing bilaterally, no crackles, normal respiratory effort without accessory muscle use, on 3 L nasal cannula with SPO2 94% at rest. Cardiovascular system: S1 & S2 heard, RRR. No JVD, murmurs, rubs, gallops or clicks. No pedal edema. Gastrointestinal system: Abdomen is nondistended, soft and nontender. No organomegaly or masses felt. Normal bowel sounds heard. Central nervous system: Alert and oriented. No focal neurological deficits. Extremities: Symmetric 5 x 5 power. Skin: No rashes, lesions or ulcers Psychiatry: Judgement and insight appear normal. Mood & affect appropriate.     Data Reviewed: I have personally reviewed following labs and imaging studies  CBC: Recent Labs  Lab 01/08/21 1915 01/09/21 0756  WBC 13.0* 10.3  NEUTROABS 9.9*  --   HGB 14.5 14.2  HCT 42.5 42.8  MCV 96.8 98.8  PLT 359 944   Basic Metabolic Panel: Recent Labs  Lab 01/08/21 1915  NA 132*  K 4.2  CL  100  CO2 22  GLUCOSE 209*  BUN 14  CREATININE 1.00  CALCIUM 9.2   GFR: Estimated Creatinine Clearance: 108.6 mL/min (by C-G formula based on SCr of 1 mg/dL). Liver Function Tests: Recent Labs  Lab 01/08/21 1915  AST 25  ALT 37  ALKPHOS 64  BILITOT 0.4  PROT 7.2  ALBUMIN 4.3   Recent Labs  Lab 01/08/21 1915  LIPASE 36   No results for input(s): AMMONIA in the last 168 hours. Coagulation Profile: No results for input(s): INR, PROTIME in the last 168 hours. Cardiac Enzymes: No results for input(s): CKTOTAL, CKMB, CKMBINDEX, TROPONINI in the last 168 hours. BNP (last 3 results) No results for input(s):  PROBNP in the last 8760 hours. HbA1C: Recent Labs    01/09/21 0756  HGBA1C 7.0*   CBG: Recent Labs  Lab 01/09/21 0754  GLUCAP 141*   Lipid Profile: No results for input(s): CHOL, HDL, LDLCALC, TRIG, CHOLHDL, LDLDIRECT in the last 72 hours. Thyroid Function Tests: No results for input(s): TSH, T4TOTAL, FREET4, T3FREE, THYROIDAB in the last 72 hours. Anemia Panel: No results for input(s): VITAMINB12, FOLATE, FERRITIN, TIBC, IRON, RETICCTPCT in the last 72 hours. Sepsis Labs: Recent Labs  Lab 01/09/21 0116 01/09/21 0756  PROCALCITON  --  <0.10  LATICACIDVEN 1.7  --     Recent Results (from the past 240 hour(s))  Resp Panel by RT-PCR (Flu A&B, Covid) Nasopharyngeal Swab     Status: None   Collection Time: 12/30/20  9:00 PM   Specimen: Nasopharyngeal Swab; Nasopharyngeal(NP) swabs in vial transport medium  Result Value Ref Range Status   SARS Coronavirus 2 by RT PCR NEGATIVE NEGATIVE Final    Comment: (NOTE) SARS-CoV-2 target nucleic acids are NOT DETECTED.  The SARS-CoV-2 RNA is generally detectable in upper respiratory specimens during the acute phase of infection. The lowest concentration of SARS-CoV-2 viral copies this assay can detect is 138 copies/mL. A negative result does not preclude SARS-Cov-2 infection and should not be used as the sole basis for treatment or other patient management decisions. A negative result may occur with  improper specimen collection/handling, submission of specimen other than nasopharyngeal swab, presence of viral mutation(s) within the areas targeted by this assay, and inadequate number of viral copies(<138 copies/mL). A negative result must be combined with clinical observations, patient history, and epidemiological information. The expected result is Negative.  Fact Sheet for Patients:  EntrepreneurPulse.com.au  Fact Sheet for Healthcare Providers:  IncredibleEmployment.be  This test is no t  yet approved or cleared by the [place] FDA and  has been authorized for detection and/or diagnosis of SARS-CoV-2 by FDA under an Emergency Use Authorization (EUA). This EUA will remain  in effect (meaning this test can be used) for the duration of the COVID-19 declaration under Section 564(b)(1) of the Act, 21 U.S.C.section 360bbb-3(b)(1), unless the authorization is terminated  or revoked sooner.       Influenza A by PCR NEGATIVE NEGATIVE Final   Influenza B by PCR NEGATIVE NEGATIVE Final    Comment: (NOTE) The Xpert Xpress SARS-CoV-2/FLU/RSV plus assay is intended as an aid in the diagnosis of influenza from Nasopharyngeal swab specimens and should not be used as a sole basis for treatment. Nasal washings and aspirates are unacceptable for Xpert Xpress SARS-CoV-2/FLU/RSV testing.  Fact Sheet for Patients: EntrepreneurPulse.com.au  Fact Sheet for Healthcare Providers: IncredibleEmployment.be  This test is not yet approved or cleared by the [place] FDA and has been authorized for detection and/or diagnosis of  SARS-CoV-2 by FDA under an Emergency Use Authorization (EUA). This EUA will remain in effect (meaning this test can be used) for the duration of the COVID-19 declaration under Section 564(b)(1) of the Act, 21 U.S.C. section 360bbb-3(b)(1), unless the authorization is terminated or revoked.  Performed at [facility], [place] [address], [place], [place] [address]   MRSA Next Gen by PCR, Nasal     Status: None   Collection Time: 01/09/21  1:16 AM   Specimen: Nasopharyngeal Swab; Nasal Swab  Result Value Ref Range Status   MRSA by PCR Next Gen NOT DETECTED NOT DETECTED Final    Comment: (NOTE) The GeneXpert MRSA Assay (FDA approved for NASAL specimens only), is one component of a comprehensive MRSA colonization surveillance program. It is not intended to diagnose MRSA infection nor to guide or monitor  treatment for MRSA infections. Test performance is not FDA approved in patients less than 40 years old. Performed at [facility], [place] [address] [place], [place] [address]   Resp Panel by RT-PCR (Flu A&B, Covid) Nasopharyngeal Swab     Status: None   Collection Time: 01/09/21  1:24 AM   Specimen: Nasopharyngeal Swab; Nasopharyngeal(NP) swabs in vial transport medium  Result Value Ref Range Status   SARS Coronavirus 2 by RT PCR NEGATIVE NEGATIVE Final    Comment: (NOTE) SARS-CoV-2 target nucleic acids are NOT DETECTED.  The SARS-CoV-2 RNA is generally detectable in upper respiratory specimens during the acute phase of infection. The lowest concentration of SARS-CoV-2 viral copies this assay can detect is 138 copies/mL. A negative result does not preclude SARS-Cov-2 infection and should not be used as the sole basis for treatment or other patient management decisions. A negative result may occur with  improper specimen collection/handling, submission of specimen other than nasopharyngeal swab, presence of viral mutation(s) within the areas targeted by this assay, and inadequate number of viral copies(<138 copies/mL). A negative result must be combined with clinical observations, patient history, and epidemiological information. The expected result is Negative.  Fact Sheet for Patients:  EntrepreneurPulse.com.au  Fact Sheet for Healthcare Providers:  IncredibleEmployment.be  This test is no t yet approved or cleared by the [place] FDA and  has been authorized for detection and/or diagnosis of SARS-CoV-2 by FDA under an Emergency Use Authorization (EUA). This EUA will remain  in effect (meaning this test can be used) for the duration of the COVID-19 declaration under Section 564(b)(1) of the Act, 21 U.S.C.section 360bbb-3(b)(1), unless the authorization is terminated  or revoked sooner.       Influenza A by PCR  NEGATIVE NEGATIVE Final   Influenza B by PCR NEGATIVE NEGATIVE Final    Comment: (NOTE) The Xpert Xpress SARS-CoV-2/FLU/RSV plus assay is intended as an aid in the diagnosis of influenza from Nasopharyngeal swab specimens and should not be used as a sole basis for treatment. Nasal washings and aspirates are unacceptable for Xpert Xpress SARS-CoV-2/FLU/RSV testing.  Fact Sheet for Patients: EntrepreneurPulse.com.au  Fact Sheet for Healthcare Providers: IncredibleEmployment.be  This test is not yet approved or cleared by the [place] FDA and has been authorized for detection and/or diagnosis of SARS-CoV-2 by FDA under an Emergency Use Authorization (EUA). This EUA will remain in effect (meaning this test can be used) for the duration of the COVID-19 declaration under Section 564(b)(1) of the Act, 21 U.S.C. section 360bbb-3(b)(1), unless the authorization is terminated or revoked.  Performed at [facility], [address] [place], [place]  [address]          Radiology Studies: DG Chest 2 View  Result Date: 01/08/2021 CLINICAL DATA:  Chest pain and shortness of breath. EXAM: CHEST - 2 VIEW COMPARISON:  Chest x-ray 11/08/2020. CT chest abdomen and pelvis 12/31/2020. FINDINGS: The cardiomediastinal silhouette is within normal limits. There are patchy airspace opacities in the left lung base increased from prior exam. Bullae in the lung bases are stable from prior study. There is no pleural effusion or pneumothorax. There are no acute fractures. IMPRESSION: 1. Patchy left lower lobe airspace disease worrisome for infection. 2.  Emphysema (ICD10-J43.9). Electronically Signed   By: Ronney Asters M.D.   On: 01/08/2021 23:36   CT Angio Chest PE W and/or Wo Contrast  Result Date: 01/09/2021 CLINICAL DATA:  Chest/back pain EXAM: CT ANGIOGRAPHY CHEST WITH CONTRAST TECHNIQUE: Multidetector CT imaging of the chest was performed  using the standard protocol during bolus administration of intravenous contrast. Multiplanar CT image reconstructions and MIPs were obtained to evaluate the vascular anatomy. CONTRAST:  23mL OMNIPAQUE IOHEXOL 350 MG/ML SOLN COMPARISON:  Chest radiographs dated 01/08/2021. CT chest dated 12/31/2020. FINDINGS: Cardiovascular: Satisfactory opacification the bilateral pulmonary arteries to the segmental level. No evidence of pulmonary embolism. Although not tailored for evaluation of the thoracic aorta, there is no evidence of thoracic aneurysm or dissection. The heart is normal in size.  No pericardial effusion. Three vessel coronary atherosclerosis. Mediastinum/Nodes: No suspicious mediastinal lymphadenopathy. Visualized thyroid is unremarkable. Lungs/Pleura: Evaluation of the lung parenchyma is constrained by respiratory motion. 3.6 x 3.1 cm mass in the left lower lobe (series 6/image 112), poorly evaluated on the current study, corresponding to the patient's known primary bronchogenic neoplasm. Moderate emphysematous changes with bullous changes in the bilateral lower lungs. Small left pleural effusion. No pneumothorax. Upper Abdomen: Visualized upper abdomen is grossly unremarkable. Musculoskeletal: Degenerative changes of the visualized thoracolumbar spine. Review of the MIP images confirms the above findings. IMPRESSION: No evidence of pulmonary embolism. 3.6 cm mass in the left lower lobe, poorly evaluated on the current study due to motion degradation, but corresponding to the patient's known primary bronchogenic neoplasm. Small left pleural effusion. Aortic Atherosclerosis (ICD10-I70.0) and Emphysema (ICD10-J43.9). Electronically Signed   By: Julian Hy M.D.   On: 01/09/2021 03:51        Scheduled Meds:  arformoterol  15 mcg Nebulization BID   aspirin EC  81 mg Oral Daily   buPROPion  300 mg Oral Daily   busPIRone  15 mg Oral Daily   diltiazem  180 mg Oral Daily   DULoxetine  60 mg Oral BID    enoxaparin (LOVENOX) injection  70 mg Subcutaneous Q24H   fluticasone  2 spray Each Nare Daily   gabapentin  900 mg Oral QHS   insulin aspart  0-5 Units Subcutaneous QHS   insulin aspart  0-9 Units Subcutaneous TID WC   lisinopril  20 mg Oral Daily   lurasidone  40 mg Oral QPC supper   OXcarbazepine  600 mg Oral Daily   And   OXcarbazepine  900 mg Oral QHS   tamsulosin  0.4 mg Oral Daily   traMADol  100 mg Oral QHS   traZODone  75 mg Oral QHS   Continuous Infusions:  azithromycin 500 mg (01/09/21 0947)   cefTRIAXone (ROCEPHIN)  IV 2 g (01/09/21 0844)     LOS: 0 days    Time spent: 39 minutes spent on chart review, discussion with nursing staff, consultants, updating family and interview/physical  exam; more than 50% of that time was spent in counseling and/or coordination of care.    Deaundra Kutzer J [place] DO [facility] Available via Epic secure chat 7am-7pm After these hours, please refer to coverage provider listed on [email or web address] 01/09/2021, 11:48 AM

## 2021-01-10 DIAGNOSIS — F32A Depression, unspecified: Secondary | ICD-10-CM | POA: Diagnosis not present

## 2021-01-10 DIAGNOSIS — E1165 Type 2 diabetes mellitus with hyperglycemia: Secondary | ICD-10-CM | POA: Diagnosis not present

## 2021-01-10 DIAGNOSIS — J9621 Acute and chronic respiratory failure with hypoxia: Secondary | ICD-10-CM | POA: Diagnosis not present

## 2021-01-10 DIAGNOSIS — J189 Pneumonia, unspecified organism: Secondary | ICD-10-CM | POA: Diagnosis not present

## 2021-01-10 LAB — BASIC METABOLIC PANEL
Anion gap: 7 (ref 5–15)
BUN: 18 mg/dL (ref 8–23)
CO2: 29 mmol/L (ref 22–32)
Calcium: 8.9 mg/dL (ref 8.9–10.3)
Chloride: 96 mmol/L — ABNORMAL LOW (ref 98–111)
Creatinine, Ser: 1 mg/dL (ref 0.61–1.24)
GFR, Estimated: >60 mL/min (ref >60)
Glucose, Bld: 126 mg/dL — ABNORMAL HIGH (ref 70–99)
Potassium: 4.3 mmol/L (ref 3.5–5.1)
Sodium: 132 mmol/L — ABNORMAL LOW (ref 135–145)

## 2021-01-10 LAB — BLOOD CULTURE ID PANEL (REFLEXED) - BCID2

## 2021-01-10 LAB — CBC
HCT: 40.9 % (ref 39.0–52.0)
Hemoglobin: 13.9 g/dL (ref 13.0–17.0)
MCH: 33.1 pg (ref 26.0–34.0)
MCHC: 34 g/dL (ref 30.0–36.0)
MCV: 97.4 fL (ref 80.0–100.0)
Platelets: 301 10*3/uL (ref 150–400)
RBC: 4.2 MIL/uL — ABNORMAL LOW (ref 4.22–5.81)
RDW: 13.2 % (ref 11.5–15.5)
WBC: 9 10*3/uL (ref 4.0–10.5)
nRBC: 0 % (ref 0.0–0.2)

## 2021-01-10 LAB — GLUCOSE, CAPILLARY
Glucose-Capillary: 110 mg/dL — ABNORMAL HIGH (ref 70–99)
Glucose-Capillary: 177 mg/dL — ABNORMAL HIGH (ref 70–99)

## 2021-01-10 MED ORDER — OXYCODONE HCL 10 MG PO TABS: 10.0000 mg | ORAL_TABLET | ORAL | 0 refills | Status: AC | PRN

## 2021-01-10 MED ORDER — AZITHROMYCIN 500 MG PO TABS: 500.0000 mg | ORAL_TABLET | Freq: Every day | ORAL | 0 refills | Status: AC

## 2021-01-10 MED ORDER — CEFDINIR 300 MG PO CAPS: 300.0000 mg | ORAL_CAPSULE | Freq: Two times a day (BID) | ORAL | 0 refills | Status: AC

## 2021-01-10 NOTE — Progress Notes (Signed)
PHARMACY - PHYSICIAN COMMUNICATION CRITICAL VALUE ALERT - BLOOD CULTURE IDENTIFICATION (BCID)  Mario [name] is an 70 y.o. male who presented to [facility] on 01/08/2021 with a chief complaint of cough  Assessment:  Admit with CAP on Rocephin + Zithromax Blood cx + Staph species in 1 set of 1 bottle -->likely contaminant.   Name of physician (or Provider) Contacted: X Blount  Current antibiotics: Rocephin + Zithromax  Changes to prescribed antibiotics recommended:  Continue Rocephin + Zithromax and monitor final cx data.   Results for orders placed or performed during the hospital encounter of 01/08/21  Blood Culture ID Panel (Reflexed) (Collected: 01/09/2021  1:16 AM)  Result Value Ref Range   Enterococcus faecalis NOT DETECTED NOT DETECTED   Enterococcus Faecium NOT DETECTED NOT DETECTED   Listeria monocytogenes NOT DETECTED NOT DETECTED   Staphylococcus species DETECTED (A) NOT DETECTED   Staphylococcus aureus (BCID) NOT DETECTED NOT DETECTED   Staphylococcus epidermidis NOT DETECTED NOT DETECTED   Staphylococcus lugdunensis NOT DETECTED NOT DETECTED   Streptococcus species NOT DETECTED NOT DETECTED   Streptococcus agalactiae NOT DETECTED NOT DETECTED   Streptococcus pneumoniae NOT DETECTED NOT DETECTED   Streptococcus pyogenes NOT DETECTED NOT DETECTED   A.calcoaceticus-baumannii NOT DETECTED NOT DETECTED   Bacteroides fragilis NOT DETECTED NOT DETECTED   Enterobacterales NOT DETECTED NOT DETECTED   Enterobacter cloacae complex NOT DETECTED NOT DETECTED   Escherichia coli NOT DETECTED NOT DETECTED   Klebsiella aerogenes NOT DETECTED NOT DETECTED   Klebsiella oxytoca NOT DETECTED NOT DETECTED   Klebsiella pneumoniae NOT DETECTED NOT DETECTED   Proteus species NOT DETECTED NOT DETECTED   Salmonella species NOT DETECTED NOT DETECTED   Serratia marcescens NOT DETECTED NOT DETECTED   Haemophilus influenzae NOT DETECTED NOT DETECTED   Neisseria meningitidis NOT DETECTED NOT  DETECTED   Pseudomonas aeruginosa NOT DETECTED NOT DETECTED   Stenotrophomonas maltophilia NOT DETECTED NOT DETECTED   Candida albicans NOT DETECTED NOT DETECTED   Candida auris NOT DETECTED NOT DETECTED   Candida glabrata NOT DETECTED NOT DETECTED   Candida krusei NOT DETECTED NOT DETECTED   Candida parapsilosis NOT DETECTED NOT DETECTED   Candida tropicalis NOT DETECTED NOT DETECTED   Cryptococcus neoformans/gattii NOT DETECTED NOT DETECTED    Netta Cedars PharmD 01/10/2021  6:15 AM

## 2021-01-10 NOTE — Discharge Summary (Signed)
Physician Discharge Summary  Mario [name] MEQ:[id number] DOB: 30-May-1950 DOA: 01/08/2021  PCP: [facility]  Admit date: 01/08/2021 Discharge date: 01/10/2021  Admitted From: Home Disposition: Home  Recommendations for Outpatient Follow-up:  Follow up with PCP in 1-2 weeks Continue azithromycin and cefdinir to complete antibiotic course for pneumonia Follow-up finalized blood cultures which were pending at time of discharge  Home Health: No Equipment/Devices: Continues on home supplemental oxygen  Discharge Condition: Stable CODE STATUS: Full code Diet recommendation: Heart healthy/consistent carbohydrate diet  History of present illness:  Mario [name] is a 70 year old male [employer] veteran with past medical history significant for chronic hypoxic respiratory failure on 3 L nasal cannula at baseline, COPD, OSA on CPAP, essential hypertension, anxiety, depression, type 2 diabetes mellitus, uveitis, ankylosing spondylitis, non-small cell lung cancer on radiation who presents to [facility] ED on 11/11 with cough, shortness of breath and pleuritic chest pain.  Patient reports coughing for several weeks with chest wall pain bilaterally.  Cough is nonproductive.  Worse when takes deep inspiration.  Patient reports is compliant with his home inhalers.  No recent sick contacts.  Also complaining of of chronic mid/lower back pain in which he is being managed at a pain clinic with injections outpatient.  No other complaints.   Patient just completed radiation treatment for his non-small cell lung cancer in September, is followed by radiation oncology Dr. Sondra Come.  Has upcoming appointment with Dr. Earlie Server medical oncology to discuss further treatment.  Has yet to start chemotherapy.   In the ED, temperature 98.3 F, HR 102, RR 20, BP 151/88, SPO2 80% on room air.  Sodium 132, potassium 4.2, chloride 100, CO2 22, glucose 209, BUN 14, creatinine 1.00, lipase 36, AST 25, ALT 37, total  bilirubin 0.4.  High sensitive troponin 5.  WBC 13.0, hemoglobin 14.5, platelets 259.  Lactic acid 1.7.  MRSA PCR negative.  Urinalysis unrevealing.  Covid-19 PCR negative.  Influenza A/B PCR negative.  Chest x-ray with patchy left lower lobe airspace disease worrisome for infection, emphysema.  CT angiogram chest with no evidence of pulmonary embolism, 3.6 cm mass left lower lobe, poorly evaluated due to motion degradation but likely secondary to known bronchogenic neoplasm, small left pleural effusion, aortic atherosclerosis and emphysema.  EDP started patient on vancomycin and cefepime for concerns of pneumonia.  Hospitalist service consulted for further evaluation and management of acute on chronic respiratory failure secondary to pneumonia.  Hospital course:  Acute on chronic respiratory failure with hypoxia, POA Community acquired pneumonia Patient presented to the ED with progressive shortness of breath, cough, pleuritic chest pain with noted oxygen saturation in the upper 80s on room air.  Patient at baseline uses 3 L nasal cannula as needed.  WBC count elevated at 13.0 with chest x-ray with left lower lobe airspace disease worrisome for pneumonia.  Patient was initially started on vancomycin and cefepime in the ED.  MRSA PCR negative.  Influenza A/B PCR negative.  COVID-19 PCR negative.  CT angiogram chest negative for PE, does note known 3.6 cm mass left lower lobe.  Suspect atypical versus gram-negative organism.  Patient was started azithromycin and ceftriaxone with resolution of leukocytosis and back to his normal oxygen need.  Patient will continue antibiotics with azithromycin and cefdinir to complete MRI course on discharge.  Positive blood culture Blood cultures 2 out of 4 positive for Staphylococcus species, etiology likely secondary to contaminant.  Follow-up finalized blood cultures which were pending at time of discharge.  COPD  On Striverdi respimat 2puffs daily at home.  Continue  home oxygen.   OSA: Continue nocturnal CPAP   Essential hypertension Cardizem 180 mg p.o. daily, Lisinopril 20 mg p.o. daily   Type 2 diabetes mellitus On metformin 5 mg p.o. twice daily at home   Non-small cell lung cancer CT angiogram chest with known 3.6 cm mass left lower lobe.  Just completed radiation therapy in September 2022, followed by Dr. Sondra Come.  Has upcoming appointment with medical oncology, Dr. Earlie Server and to discuss chemotherapy options.   History of uveitis/ankylosing spondylitis Chronic pain syndrome CellCept 1500 mg p.o. twice daily, methotrexate, tramadol 100 mg p.o. nightly, gabapentin, oxycodone 10 mg as needed.  Outpatient follow-up with pain clinic.   Mood disorder Cymbalta 60 mg p.o. twice daily, Trileptal 600 mg p.o. daily and 900mg  PO qHS, Latuda 40 mg p.o. daily, Wellbutrin 300 mg p.o. daily, BuSpar 50 mg p.o. daily   BPH: Tamsulosin 0.4 mg p.o. daily   Morbid obesity: Body mass index is 43.02 kg/m.  Discussed with patient needs for aggressive lifestyle changes/weight loss as this complicates all facets of care.  Outpatient follow-up with PCP.  May benefit from bariatric evaluation outpatient.  Discharge Diagnoses:  Principal Problem:   CAP (community acquired pneumonia) Active Problems:   Depression   DM2 (diabetes mellitus, type 2) (HCC)   OSA (obstructive sleep apnea)    Discharge Instructions  Discharge Instructions     Call MD for:  difficulty breathing, headache or visual disturbances   Complete by: As directed    Call MD for:  extreme fatigue   Complete by: As directed    Call MD for:  persistant dizziness or light-headedness   Complete by: As directed    Call MD for:  persistant nausea and vomiting   Complete by: As directed    Call MD for:  severe uncontrolled pain   Complete by: As directed    Call MD for:  temperature >100.4   Complete by: As directed    Diet - low sodium heart healthy   Complete by: As directed    Increase  activity slowly   Complete by: As directed       Allergies as of 01/10/2021       Reactions   Demerol [meperidine] Nausea And Vomiting, Other (See Comments)   Made the patient "violently sick"   Zocor [simvastatin] Nausea And Vomiting, Other (See Comments)   Made him very jittery, also        Medication List     STOP taking these medications    Certolizumab Pegol 2 X 200 MG/ML Pskt   HYDROcodone-acetaminophen 5-325 MG tablet Commonly known as: Norco       TAKE these medications    acetaminophen 500 MG tablet Commonly known as: TYLENOL Take 1,000 mg by mouth every 6 (six) hours as needed for moderate pain.   albuterol 108 (90 Base) MCG/ACT inhaler Commonly known as: VENTOLIN HFA Inhale 2 puffs into the lungs every 6 (six) hours as needed for wheezing.   albuterol (2.5 MG/3ML) 0.083% nebulizer solution Commonly known as: PROVENTIL Inhale 3 mLs (2.5 mg total) by nebulization every 4 (four) hours as needed for wheezing or shortness of breath.   aspirin 81 MG EC tablet Take 81 mg by mouth daily.   azithromycin 500 MG tablet Commonly known as: Zithromax Take 1 tablet (500 mg total) by mouth daily for 3 days. Take 1 tablet daily for 3 days. Start taking on: January 11, 2021  b complex vitamins capsule Take 1 capsule by mouth daily.   bisacodyl 5 MG EC tablet Commonly known as: DULCOLAX Take 1 tablet (5 mg total) by mouth daily at 12 noon. What changed: when to take this   buPROPion 300 MG 24 hr tablet Commonly known as: WELLBUTRIN XL Take 300 mg by mouth daily.   busPIRone 15 MG tablet Commonly known as: BUSPAR Take 15 mg by mouth daily.   calcium-vitamin D 500-200 MG-UNIT tablet Commonly known as: OSCAL WITH D Take 1 tablet by mouth 2 (two) times daily with a meal.   cefdinir 300 MG capsule Commonly known as: OMNICEF Take 1 capsule (300 mg total) by mouth 2 (two) times daily for 5 days. Start taking on: January 11, 2021   cyclobenzaprine 10 MG  tablet Commonly known as: FLEXERIL Take 20 mg by mouth at bedtime.   diltiazem 180 MG 24 hr capsule Commonly known as: CARDIZEM CD Take 180 mg by mouth daily.   docusate sodium 100 MG capsule Commonly known as: COLACE Take 2 capsules (200 mg total) by mouth 2 (two) times daily. What changed: when to take this   DULoxetine 60 MG capsule Commonly known as: CYMBALTA Take 1 capsule (60 mg total) by mouth 2 (two) times daily.   feeding supplement Liqd Take 237 mLs by mouth 3 (three) times daily between meals. What changed: when to take this   fluticasone 50 MCG/ACT nasal spray Commonly known as: FLONASE Place 2 sprays into both nostrils daily.   folic acid 1 MG tablet Commonly known as: FOLVITE Take 1 mg by mouth daily.   gabapentin 300 MG capsule Commonly known as: NEURONTIN Take 900 mg by mouth at bedtime.   Garlic Oil 3 MG Caps Take 3 mg by mouth daily.   lisinopril 20 MG tablet Commonly known as: ZESTRIL Take 20 mg by mouth daily.   lurasidone 40 MG Tabs tablet Commonly known as: LATUDA Take 40 mg by mouth daily after supper.   melatonin 3 MG Tabs tablet Take 6 mg by mouth at bedtime.   metFORMIN 500 MG tablet Commonly known as: GLUCOPHAGE Take 500 mg by mouth 2 (two) times daily with a meal.   methotrexate 2.5 MG tablet Commonly known as: RHEUMATREX Take 15 mg by mouth once a week. On Saturday   multivitamin with minerals Tabs tablet Take 1 tablet by mouth daily.   mycophenolate 250 MG capsule Commonly known as: CELLCEPT Take 1,500 mg by mouth 2 (two) times daily.   nitroGLYCERIN 0.4 MG SL tablet Commonly known as: NITROSTAT Place 1 tablet (0.4 mg total) under the tongue every 5 (five) minutes as needed for chest pain.   OVER THE COUNTER MEDICATION Place 1 drop into both ears daily as needed (itching). Miracell ProEar for irritated itchy ears   OVER THE COUNTER MEDICATION Take 2 tablets by mouth at bedtime. Pure Zzzz   oxcarbazepine 600 MG  tablet Commonly known as: TRILEPTAL Take 600-900 mg by mouth See admin instructions. Take 600mg  by mouth in the morning then 900mg  at bedtime   Oxycodone HCl 10 MG Tabs Take 1 tablet (10 mg total) by mouth every 4 (four) hours as needed for up to 7 days for moderate pain.   polyethylene glycol 17 g packet Commonly known as: MIRALAX / GLYCOLAX Take 17 g by mouth daily.   polyvinyl alcohol 1.4 % ophthalmic solution Commonly known as: LIQUIFILM TEARS Place 1 drop into both eyes 4 (four) times daily as needed for dry eyes.  PRESCRIPTION MEDICATION Inhale into the lungs See admin instructions. CPAP- At bedtime and during during any naps   sodium chloride 0.65 % Soln nasal spray Commonly known as: OCEAN Place 2 sprays into both nostrils 4 (four) times daily as needed for congestion.   Striverdi Respimat 2.5 MCG/ACT Aers Generic drug: Olodaterol HCl Inhale 2 puffs into the lungs daily.   Systane Balance 0.6 % Soln Generic drug: Propylene Glycol Place 1 drop into both eyes in the morning, at noon, and at bedtime.   tamsulosin 0.4 MG Caps capsule Commonly known as: FLOMAX Take 0.4 mg by mouth daily.   traMADol 50 MG tablet Commonly known as: ULTRAM Take 100 mg by mouth at bedtime.   traZODone 50 MG tablet Commonly known as: DESYREL Take 75 mg by mouth at bedtime.   VITAMIN A PO Take 1 tablet by mouth daily.   vitamin C 500 MG tablet Commonly known as: ASCORBIC ACID Take 500 mg by mouth 2 (two) times daily.   Vitamin D-3 25 MCG (1000 UT) Caps Take 1,000 Units by mouth daily.        Follow-up Information     [facility], [facility]. Schedule an appointment as soon as possible for a visit in 1 week(s).   Contact information: [place] [address] [phone]                Allergies  Allergen Reactions   Demerol [Meperidine] Nausea And Vomiting and Other (See Comments)    Made the patient "violently sick"   Zocor  [Simvastatin] Nausea And Vomiting and Other (See Comments)    Made him very jittery, also    Consultations: None   Procedures/Studies: DG Chest 2 View  Result Date: 01/08/2021 CLINICAL DATA:  Chest pain and shortness of breath. EXAM: CHEST - 2 VIEW COMPARISON:  Chest x-ray 11/08/2020. CT chest abdomen and pelvis 12/31/2020. FINDINGS: The cardiomediastinal silhouette is within normal limits. There are patchy airspace opacities in the left lung base increased from prior exam. Bullae in the lung bases are stable from prior study. There is no pleural effusion or pneumothorax. There are no acute fractures. IMPRESSION: 1. Patchy left lower lobe airspace disease worrisome for infection. 2.  Emphysema (ICD10-J43.9). Electronically Signed   By: Ronney Asters M.D.   On: 01/08/2021 23:36   DG Chest 2 View  Result Date: 12/30/2020 CLINICAL DATA:  Shortness of breath. EXAM: CHEST - 2 VIEW COMPARISON:  Chest x-ray 12/25/2020. PET CT 09/18/2020. FINDINGS: There is a stable small left pleural effusion. There is some increasing patchy airspace disease in the left lower lobe. There is no evidence for pneumothorax. The cardiomediastinal silhouette is within normal limits. IMPRESSION: 1. Increasing left lower lobe patchy airspace disease. Findings are concerning for infection. Followup PA and lateral chest X-ray is recommended in 3-4 weeks following trial of antibiotic therapy to ensure resolution and exclude underlying malignancy. 2. Stable small left pleural effusion. Electronically Signed   By: Ronney Asters M.D.   On: 12/30/2020 15:53   CT Chest W Contrast  Result Date: 12/31/2020 CLINICAL DATA:  Abdominal abscess or infection suspected. Increasing shortness of breath and leukocytosis. Previous history of lung cancer with resection, radiation therapy completed August 2022 EXAM: CT CHEST, ABDOMEN, AND PELVIS WITH CONTRAST TECHNIQUE: Multidetector CT imaging of the chest, abdomen and pelvis was performed following  the standard protocol during bolus administration of intravenous contrast. CONTRAST:  136mL OMNIPAQUE IOHEXOL 350 MG/ML SOLN COMPARISON:  CTA chest 09/03/2020, CT abdomen  pelvis without contrast 07/25/2020, CT abdomen pelvis with contrast 04/03/2020 FINDINGS: CT CHEST FINDINGS Cardiovascular: Normal cardiac size. Patchy three-vessel calcific CAD is again noted. There is no pericardial effusion. No evidence of central pulmonary arterial embolism. There is no aortic aneurysm or dissection. There is patchy mixed plaque in the descending aortic segment. The great vessels are widely patent. Mediastinum/Nodes: No intrathoracic or axillary adenopathy or thyroid mass. Lungs/Pleura: There is a small but increasing partially loculated left pleural effusion posteriorly. Centrilobular, paraseptal and panlobular emphysematous changes are again seen as well as bullous disease in both bases, scattered linear scar-like opacities. Posterolateral left lower lobe mass is again noted, today measuring 4.4 x 3.8 cm, previously 4.1 x 3.0 cm. There is posterior atelectasis in both lower lobes. No focal pneumonia is seen. Musculoskeletal: 1 there is osteopenia and multilevel thoracic spine bridging enthesopathy. No destructive thoracic bone lesion is seen. CT ABDOMEN PELVIS FINDINGS Hepatobiliary: The liver is 18 cm length mildly steatotic, showing a slightly cirrhotic configuration; the gallbladder is distended to nearly 13 cm in length but is without wall thickening or appreciable calcified stones. A 2.7 cm cyst in the anterior segment of the right lobe is redemonstrated without mass enhancement. Pancreas: Unremarkable. No pancreatic ductal dilatation or surrounding inflammatory changes. Spleen: Normal. Adrenals/Urinary Tract: There are small renal cysts without mass, calculus or hydronephrosis. No adrenal mass is seen. Enlarged prostate measures 5.9 cm with bladder base impression but bladder is unremarkable. Stomach/Bowel: The gastric  wall is normal. The unopacified small bowel is unremarkable. There is no appendiceal dilatation or inflammatory change. There is moderate to severe stool retention. There are colonic diverticula greatest in sigmoid segment. Trace interstitial haziness in the perisigmoid fat is similar to the prior studies and could be due to a fat scarring from old diverticulitis or an early acute diverticulitis, but there is no free air, free fluid or abscess. Vascular/Lymphatic: Aortic atherosclerosis. No enlarged abdominal or pelvic lymph nodes. Reproductive: Enlarged prostate, 5.9 cm transverse Other: There are small umbilical and inguinal fat hernias. There is no free fluid, free air or free hemorrhage Musculoskeletal: There is osteopenia, degenerative changes and slight levoscoliosis of the lumbar spine. There is ankylosis across the right SI joint. IMPRESSION: 1. Small but enlarging partially loculated posterior left pleural effusion with left lower lobe mass slightly larger than previously. Likely enlarging neoplasm. 2. COPD changes without visible acute infiltrate. 3. Vascular calcifications including coronary arteries. 4. Moderate to severe constipation, with diverticulosis greatest in the sigmoid, with Perisigmoid faint interstitial changes which could be due to fat scarring from old diverticulitis or early acute diverticulitis. Similar findings were present on the prior studies. 5. Fatty liver with a slightly cirrhotic configuration. No splenomegaly. No ascites. 6. Enlarged prostate. 7. Umbilical and inguinal fat hernias. 8. Osteopenia and degenerative and DISH changes. No destructive bone lesion is seen. 9. Distended but otherwise unremarkable gallbladder. Electronically Signed   By: Telford Nab M.D.   On: 12/31/2020 02:50   CT Angio Chest PE W and/or Wo Contrast  Result Date: 01/09/2021 CLINICAL DATA:  Chest/back pain EXAM: CT ANGIOGRAPHY CHEST WITH CONTRAST TECHNIQUE: Multidetector CT imaging of the chest was  performed using the standard protocol during bolus administration of intravenous contrast. Multiplanar CT image reconstructions and MIPs were obtained to evaluate the vascular anatomy. CONTRAST:  16mL OMNIPAQUE IOHEXOL 350 MG/ML SOLN COMPARISON:  Chest radiographs dated 01/08/2021. CT chest dated 12/31/2020. FINDINGS: Cardiovascular: Satisfactory opacification the bilateral pulmonary arteries to the segmental level. No evidence of pulmonary  embolism. Although not tailored for evaluation of the thoracic aorta, there is no evidence of thoracic aneurysm or dissection. The heart is normal in size.  No pericardial effusion. Three vessel coronary atherosclerosis. Mediastinum/Nodes: No suspicious mediastinal lymphadenopathy. Visualized thyroid is unremarkable. Lungs/Pleura: Evaluation of the lung parenchyma is constrained by respiratory motion. 3.6 x 3.1 cm mass in the left lower lobe (series 6/image 112), poorly evaluated on the current study, corresponding to the patient's known primary bronchogenic neoplasm. Moderate emphysematous changes with bullous changes in the bilateral lower lungs. Small left pleural effusion. No pneumothorax. Upper Abdomen: Visualized upper abdomen is grossly unremarkable. Musculoskeletal: Degenerative changes of the visualized thoracolumbar spine. Review of the MIP images confirms the above findings. IMPRESSION: No evidence of pulmonary embolism. 3.6 cm mass in the left lower lobe, poorly evaluated on the current study due to motion degradation, but corresponding to the patient's known primary bronchogenic neoplasm. Small left pleural effusion. Aortic Atherosclerosis (ICD10-I70.0) and Emphysema (ICD10-J43.9). Electronically Signed   By: Julian Hy M.D.   On: 01/09/2021 03:51   CT Lumbar Spine Wo Contrast  Result Date: 12/31/2020 CLINICAL DATA:  Low back pain with increased fracture risk EXAM: CT LUMBAR SPINE WITHOUT CONTRAST TECHNIQUE: Multidetector CT imaging of the lumbar spine was  performed without intravenous contrast administration. Multiplanar CT image reconstructions were also generated. COMPARISON:  None. 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 Alignment: Normal. Vertebrae: No acute fracture or focal pathologic process. Paraspinal and other soft tissues: Calcific aortic atherosclerosis. Disc levels: Multilevel degenerative disc disease and facet arthrosis. No high-grade spinal canal stenosis. No neural impingement. IMPRESSION: 1. No acute fracture or static subluxation of the lumbar spine. 2. 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 Aortic Atherosclerosis (ICD10-I70.0). Electronically Signed   By: Ulyses Jarred M.D.   On: 12/31/2020 02:22   CT ABDOMEN PELVIS W CONTRAST  Result Date: 12/31/2020 CLINICAL DATA:  Abdominal abscess or infection suspected. Increasing shortness of breath and leukocytosis. Previous history of lung cancer with resection, radiation therapy completed August 2022 EXAM: CT CHEST, ABDOMEN, AND PELVIS WITH CONTRAST TECHNIQUE: Multidetector CT imaging of the chest, abdomen and pelvis was performed following the standard protocol during bolus administration of intravenous contrast. CONTRAST:  11mL OMNIPAQUE IOHEXOL 350 MG/ML SOLN COMPARISON:  CTA chest 09/03/2020, CT abdomen pelvis without contrast 07/25/2020, CT abdomen pelvis with contrast 04/03/2020 FINDINGS: CT CHEST FINDINGS Cardiovascular: Normal cardiac size. Patchy three-vessel calcific CAD is again noted. There is no pericardial effusion. No evidence of central pulmonary arterial embolism. There is no aortic aneurysm or dissection. There is patchy mixed plaque in the descending aortic segment. The great vessels are widely patent. Mediastinum/Nodes: No intrathoracic or axillary adenopathy or thyroid mass. Lungs/Pleura: There is a small but increasing partially loculated  left pleural effusion posteriorly. Centrilobular, paraseptal and panlobular emphysematous changes are again seen as well as bullous disease in both bases, scattered linear scar-like opacities. Posterolateral left lower lobe mass is again noted, today measuring 4.4 x 3.8 cm, previously 4.1 x 3.0 cm. There is posterior atelectasis in both lower lobes. No focal pneumonia is seen. Musculoskeletal: 1 there is osteopenia and multilevel thoracic spine bridging enthesopathy. No destructive thoracic bone lesion is seen. CT ABDOMEN PELVIS FINDINGS Hepatobiliary: The liver is 18 cm length mildly steatotic, showing a slightly cirrhotic configuration; the gallbladder is distended to nearly 13 cm in length but is without wall thickening or appreciable calcified stones. A 2.7 cm  cyst in the anterior segment of the right lobe is redemonstrated without mass enhancement. Pancreas: Unremarkable. No pancreatic ductal dilatation or surrounding inflammatory changes. Spleen: Normal. Adrenals/Urinary Tract: There are small renal cysts without mass, calculus or hydronephrosis. No adrenal mass is seen. Enlarged prostate measures 5.9 cm with bladder base impression but bladder is unremarkable. Stomach/Bowel: The gastric wall is normal. The unopacified small bowel is unremarkable. There is no appendiceal dilatation or inflammatory change. There is moderate to severe stool retention. There are colonic diverticula greatest in sigmoid segment. Trace interstitial haziness in the perisigmoid fat is similar to the prior studies and could be due to a fat scarring from old diverticulitis or an early acute diverticulitis, but there is no free air, free fluid or abscess. Vascular/Lymphatic: Aortic atherosclerosis. No enlarged abdominal or pelvic lymph nodes. Reproductive: Enlarged prostate, 5.9 cm transverse Other: There are small umbilical and inguinal fat hernias. There is no free fluid, free air or free hemorrhage Musculoskeletal: There is  osteopenia, degenerative changes and slight levoscoliosis of the lumbar spine. There is ankylosis across the right SI joint. IMPRESSION: 1. Small but enlarging partially loculated posterior left pleural effusion with left lower lobe mass slightly larger than previously. Likely enlarging neoplasm. 2. COPD changes without visible acute infiltrate. 3. Vascular calcifications including coronary arteries. 4. Moderate to severe constipation, with diverticulosis greatest in the sigmoid, with Perisigmoid faint interstitial changes which could be due to fat scarring from old diverticulitis or early acute diverticulitis. Similar findings were present on the prior studies. 5. Fatty liver with a slightly cirrhotic configuration. No splenomegaly. No ascites. 6. Enlarged prostate. 7. Umbilical and inguinal fat hernias. 8. Osteopenia and degenerative and DISH changes. No destructive bone lesion is seen. 9. Distended but otherwise unremarkable gallbladder. Electronically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

## 2021-01-10 NOTE — TOC Transition Note (Signed)
Transition of Care [facility]) - CM/SW Discharge Note   Patient Details  Name: Mario [name] MRN: [id number] Date of Birth: 1951-01-21  [facility] CM/SW Contact:  Ross Ludwig, LCSW Phone Number: 01/10/2021, 11:46 AM   Clinical Narrative:     Patient will be discharging back home.  Patient is on oxygen, and currently has oxygen at home.  Patient requested EMS transport back home.  CSW contacted [facility], and updated bedside nurse.  TOC signing off, please reconsult if social work needs arise.  Final next level of care: Home/Self Care Barriers to Discharge: Barriers Resolved   Patient Goals and CMS Choice Patient states their goals for this hospitalization and ongoing recovery are:: To return back home      Discharge Placement  Patient discharging back home.                     Discharge Plan and Services                                     Social Determinants of Health (SDOH) Interventions     Readmission Risk Interventions No flowsheet data found.

## 2021-01-10 NOTE — Progress Notes (Signed)
AVS given to patient and explained at the bedside. Medications and follow up appointments have been explained with pt verbalizing understanding.  

## 2021-01-11 ENCOUNTER — Emergency Department (HOSPITAL_COMMUNITY)
Admission: EM | Admit: 2021-01-11 | Discharge: 2021-01-12 | Disposition: A | Discharge status: Home / Self Care | Payer: No Typology Code available for payment source | Attending: Emergency Medicine | Admitting: Emergency Medicine

## 2021-01-11 ENCOUNTER — Encounter (HOSPITAL_COMMUNITY): Payer: Self-pay | Admitting: Emergency Medicine

## 2021-01-11 ENCOUNTER — Other Ambulatory Visit: Payer: Self-pay

## 2021-01-11 DIAGNOSIS — R0789 Other chest pain: Secondary | ICD-10-CM | POA: Insufficient documentation

## 2021-01-11 DIAGNOSIS — Z7984 Long term (current) use of oral hypoglycemic drugs: Secondary | ICD-10-CM | POA: Diagnosis not present

## 2021-01-11 DIAGNOSIS — I129 Hypertensive chronic kidney disease with stage 1 through stage 4 chronic kidney disease, or unspecified chronic kidney disease: Secondary | ICD-10-CM | POA: Insufficient documentation

## 2021-01-11 DIAGNOSIS — Z87891 Personal history of nicotine dependence: Secondary | ICD-10-CM | POA: Insufficient documentation

## 2021-01-11 DIAGNOSIS — J441 Chronic obstructive pulmonary disease with (acute) exacerbation: Secondary | ICD-10-CM | POA: Diagnosis not present

## 2021-01-11 DIAGNOSIS — R000 Tachycardia, unspecified: Secondary | ICD-10-CM | POA: Diagnosis not present

## 2021-01-11 DIAGNOSIS — Z85118 Personal history of other malignant neoplasm of bronchus and lung: Secondary | ICD-10-CM | POA: Insufficient documentation

## 2021-01-11 DIAGNOSIS — E871 Hypo-osmolality and hyponatremia: Secondary | ICD-10-CM | POA: Diagnosis not present

## 2021-01-11 DIAGNOSIS — Z79899 Other long term (current) drug therapy: Secondary | ICD-10-CM | POA: Diagnosis not present

## 2021-01-11 DIAGNOSIS — E1122 Type 2 diabetes mellitus with diabetic chronic kidney disease: Secondary | ICD-10-CM | POA: Insufficient documentation

## 2021-01-11 DIAGNOSIS — N1831 Chronic kidney disease, stage 3a: Secondary | ICD-10-CM | POA: Insufficient documentation

## 2021-01-11 DIAGNOSIS — R079 Chest pain, unspecified: Secondary | ICD-10-CM | POA: Diagnosis present

## 2021-01-11 DIAGNOSIS — Z7982 Long term (current) use of aspirin: Secondary | ICD-10-CM | POA: Insufficient documentation

## 2021-01-11 MED ORDER — ONDANSETRON 8 MG PO TBDP
8.0000 mg | ORAL_TABLET | Freq: Once | ORAL | Status: AC
  Administered 2021-01-12: 8 mg via ORAL
  Filled 2021-01-11: qty 1

## 2021-01-11 MED ORDER — AZITHROMYCIN 250 MG PO TABS
500.0000 mg | ORAL_TABLET | Freq: Once | ORAL | Status: AC
  Administered 2021-01-12: 500 mg via ORAL
  Filled 2021-01-11: qty 2

## 2021-01-11 MED ORDER — CEFDINIR 300 MG PO CAPS
600.0000 mg | ORAL_CAPSULE | Freq: Once | ORAL | Status: AC
  Administered 2021-01-12: 600 mg via ORAL
  Filled 2021-01-11: qty 2

## 2021-01-11 MED ORDER — ALBUTEROL SULFATE HFA 108 (90 BASE) MCG/ACT IN AERS: 2.0000 | INHALATION_SPRAY | RESPIRATORY_TRACT | Status: DC | PRN

## 2021-01-11 MED ORDER — IBUPROFEN 200 MG PO TABS
400.0000 mg | ORAL_TABLET | Freq: Once | ORAL | Status: AC
  Administered 2021-01-12: 400 mg via ORAL
  Filled 2021-01-11: qty 2

## 2021-01-11 NOTE — ED Triage Notes (Signed)
Pt arrived by EMS from home. Pt was diagnosed with pneumonia several days ago and was discharged home yesterday. Pt was sent home with antibiotics RX but has not started his antibiotics. Pt states that his chest hurts worse now than when he was in the hospital before. Pt has hx of COPD and uses oxygen when he exerts. Pt's oxygen is 89 on room air in triage.

## 2021-01-11 NOTE — ED Provider Notes (Signed)
[name] DEPT Provider Note   CSN: [id number] Arrival date & time: 01/11/21  2054     History Chief Complaint  Patient presents with   Pneumonia    Mario [name] is a 70 y.o. male.  The history is provided by the patient.  Pneumonia He has history of hypertension, COPD, lung cancer and was recently hospitalized with pneumonia.  He comes in complaining of pain across his lower chest anteriorly.  This is the same pain that he had while he was in the hospital.  It is constant, but worse when he exhales.  He rates pain at 4/10.  Pain is actually worse now than when he was in the hospital.  He denies fever, chills, sweats.  He denies any change in his normal baseline dyspnea (on home oxygen 3 L/min).  Of note, he was discharged with prescriptions for azithromycin and cefdinir, but they were ordered through a mail order pharmacy and he has not received the prescriptions yet.  At home, he has taken acetaminophen for pain without relief.   Past Medical History:  Diagnosis Date   Anxiety    Bronchitis    COPD (chronic obstructive pulmonary disease) (HCC)    Depression    History of radiation therapy    Left lung- 10/05/20-10/15/20- Dr. Gery Pray   Hypertension    Mental disorder    MI (myocardial infarction) [facility])    ????   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   Stage 3a chronic kidney disease ([place]) 12/26/2019   Abnormal CT of the chest 12/26/2019   COPD (chronic obstructive pulmonary disease) ([place])     AKI (acute kidney injury) ([place])    Hyperglycemia 06/23/2019   OSA (obstructive sleep apnea) 06/23/2019   Suicidal ideation 06/23/2019   Major depressive disorder, recurrent episode ([place]) 10/12/2018   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   Cellulitis of both feet 07/16/2017   DM2 (diabetes mellitus, type 2) ([place]) 07/16/2017   HLA B27 (HLA B27 positive) 07/16/2017   Acute chest pain    Hyperkalemia    Spontaneous pneumothorax 06/27/2016   Hypoxia    Pneumothorax on left    Dyspnea 05/23/2014   COPD exacerbation ([place]) 05/23/2014   Malingering 01/21/2013   Depression 01/11/2013   Tobacco abuse 01/11/2013   Obesity, unspecified 01/11/2013   Chest pain 01/10/2013   HTN (hypertension) 01/10/2013    Past Surgical History:  Procedure Laterality Date   BRONCHIAL BIOPSY  07/30/2020   Procedure: BRONCHIAL BIOPSIES;  Surgeon: Garner Nash, DO;  Location: [facility];  Service: Pulmonary;;   BRONCHIAL BRUSHINGS  07/30/2020   Procedure: BRONCHIAL BRUSHINGS;  Surgeon: Garner Nash, DO;  Location: [facility];  Service: Pulmonary;;   BRONCHIAL NEEDLE ASPIRATION BIOPSY  07/30/2020   Procedure: BRONCHIAL NEEDLE ASPIRATION BIOPSIES;  Surgeon: Garner Nash, DO;  Location: [facility];  Service: Pulmonary;;   BRONCHIAL WASHINGS  07/30/2020   Procedure: BRONCHIAL WASHINGS;  Surgeon: Valeta Harms,  Octavio Graves, DO;  Location: [facility];  Service: Pulmonary;;   CHEST TUBE INSERTION Left 06/27/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     SKIN CANCER EXCISION     VIDEO BRONCHOSCOPY WITH ENDOBRONCHIAL NAVIGATION Left 07/30/2020   Procedure: VIDEO BRONCHOSCOPY WITH ENDOBRONCHIAL NAVIGATION;  Surgeon: Garner Nash, DO;  Location: [facility];  Service: Pulmonary;  Laterality: Left;       Family History  Problem Relation Age of Onset   Dementia Father     Social History   Tobacco Use   Smoking status: Former    Packs/day: 1.00    Years: 35.00    Pack years: 35.00     Types: Cigarettes    Quit date: 05/2016    Years since quitting: 4.6   Smokeless tobacco: Never  Vaping Use   Vaping Use: Never used  Substance Use Topics   Alcohol use: No    Alcohol/week: 0.0 standard drinks    Comment: denies use of any drugs or alcohol   Drug use: No    Home Medications Prior to Admission medications   Medication Sig Start Date End Date Taking? Authorizing Provider  acetaminophen (TYLENOL) 500 MG tablet Take 1,000 mg by mouth every 6 (six) hours as needed for moderate pain.    [provider]  albuterol (PROVENTIL) (2.5 MG/3ML) 0.083% nebulizer solution Inhale 3 mLs (2.5 mg total) by nebulization every 4 (four) hours as needed for wheezing or shortness of breath. 08/04/20 08/04/21  Lavina Hamman, MD  albuterol (VENTOLIN HFA) 108 (90 Base) MCG/ACT inhaler Inhale 2 puffs into the lungs every 6 (six) hours as needed for wheezing. 06/02/20   [provider]  aspirin 81 MG EC tablet Take 81 mg by mouth daily.    [provider]  azithromycin (ZITHROMAX) 500 MG tablet Take 1 tablet (500 mg total) by mouth daily for 3 days. Take 1 tablet daily for 3 days. 01/11/21 01/14/21  [place] Eric J, DO  b complex vitamins capsule Take 1 capsule by mouth daily.    [provider]  bisacodyl (DULCOLAX) 5 MG EC tablet Take 1 tablet (5 mg total) by mouth daily at 12 noon. Patient taking differently: Take 5 mg by mouth daily. 08/04/20   Lavina Hamman, MD  buPROPion (WELLBUTRIN XL) 300 MG 24 hr tablet Take 300 mg by mouth daily. 02/05/20   [provider]  busPIRone (BUSPAR) 15 MG tablet Take 15 mg by mouth daily.    [provider]  calcium-vitamin D (OSCAL WITH D) 500-200 MG-UNIT tablet Take 1 tablet by mouth 2 (two) times daily with a meal.    [provider]  cefdinir (OMNICEF) 300 MG capsule Take 1 capsule (300 mg total) by mouth 2 (two) times daily for 5 days. 01/11/21 01/16/21  [place] Eric J, DO  Cholecalciferol  (VITAMIN D-3) 25 MCG (1000 UT) CAPS Take 1,000 Units by mouth daily.    [provider]  cyclobenzaprine (FLEXERIL) 10 MG tablet Take 20 mg by mouth at bedtime.    [provider]  diltiazem (CARDIZEM CD) 180 MG 24 hr capsule Take 180 mg by mouth daily.    [provider]  docusate sodium (COLACE) 100 MG capsule Take 2 capsules (200 mg total) by mouth 2 (two) times daily. Patient taking differently: Take 200 mg by mouth daily. 08/04/20   Lavina Hamman, MD  DULoxetine (CYMBALTA) 60 MG capsule Take 1 capsule (60 mg total) by mouth 2 (two) times daily. 07/21/17  Lavina Hamman, MD  feeding supplement (ENSURE ENLIVE / ENSURE PLUS) LIQD Take 237 mLs by mouth 3 (three) times daily between meals. Patient taking differently: Take 237 mLs by mouth daily. 08/04/20   Lavina Hamman, MD  fluticasone Asencion Islam) 50 MCG/ACT nasal spray Place 2 sprays into both nostrils daily.     [provider]  folic acid (FOLVITE) 1 MG tablet Take 1 mg by mouth daily.    [provider]  gabapentin (NEURONTIN) 300 MG capsule Take 900 mg by mouth at bedtime.    [provider]  Garlic Oil 3 MG CAPS Take 3 mg by mouth daily.    [provider]  lisinopril (ZESTRIL) 20 MG tablet Take 20 mg by mouth daily.    [provider]  lurasidone (LATUDA) 40 MG TABS tablet Take 40 mg by mouth daily after supper.    [provider]  Melatonin 3 MG TABS Take 6 mg by mouth at bedtime.    [provider]  metFORMIN (GLUCOPHAGE) 500 MG tablet Take 500 mg by mouth 2 (two) times daily with a meal.    [provider]  methotrexate (RHEUMATREX) 2.5 MG tablet Take 15 mg by mouth once a week. On Saturday    [provider]  Multiple Vitamin (MULTIVITAMIN WITH MINERALS) TABS tablet Take 1 tablet by mouth daily.    [provider]  mycophenolate (CELLCEPT) 250 MG capsule Take 1,500 mg by mouth 2 (two) times daily.    [provider]  nitroGLYCERIN (NITROSTAT) 0.4 MG SL tablet Place 1 tablet (0.4 mg total) under the tongue every 5 (five) minutes as needed for chest pain. 07/03/19   [place], Suann Larry, MD  Olodaterol HCl (STRIVERDI RESPIMAT) 2.5 MCG/ACT AERS Inhale 2 puffs into the lungs daily.    [provider]  OVER THE COUNTER MEDICATION Place 1 drop into both ears daily as needed (itching). Miracell ProEar for irritated itchy ears    [provider]  OVER THE COUNTER MEDICATION Take 2 tablets by mouth at bedtime. Pure Zzzz    [provider]  oxcarbazepine (TRILEPTAL) 600 MG tablet Take 600-900 mg by mouth See admin instructions. Take 610m by mouth in the morning then 9049mat bedtime 07/16/20   [provider]  oxyCODONE 10 MG TABS Take 1 tablet (10 mg total) by mouth every 4 (four) hours as needed for up to 7 days for moderate pain. 01/10/21 01/17/21  [place]ErDonnamarie PoagDO  polyethylene glycol (MIRALAX / GLYCOLAX) 17 g packet Take 17 g by mouth daily.    [provider]  polyvinyl alcohol (LIQUIFILM TEARS) 1.4 % ophthalmic solution Place 1 drop into both eyes 4 (four) times daily as needed for dry eyes.    [provider]  PRESCRIPTION MEDICATION Inhale into the lungs See admin instructions. CPAP- At bedtime and during during any naps    [provider]  Propylene Glycol (SYSTANE BALANCE) 0.6 % SOLN Place 1 drop into both eyes in the morning, at noon, and at bedtime.     [provider]  sodium chloride (OCEAN) 0.65 % SOLN nasal spray Place 2 sprays into both nostrils 4 (four) times daily as needed for congestion.    [provider]  tamsulosin (FLOMAX) 0.4 MG CAPS capsule Take 0.4 mg by mouth daily.    [provider]  traMADol (ULTRAM) 50 MG tablet Take 100 mg by mouth at bedtime.    [provider]  traZODone (  DESYREL) 50 MG tablet Take 75 mg by mouth at bedtime.    [provider]  VITAMIN A PO Take 1 tablet by  mouth daily.    [provider]  vitamin C (ASCORBIC ACID) 500 MG tablet Take 500 mg by mouth 2 (two) times daily.     [provider]    Allergies    Demerol [meperidine] and Zocor [simvastatin]  Review of Systems   Review of Systems  All other systems reviewed and are negative.  Physical Exam Updated Vital Signs BP 125/71   Pulse 94   Temp 98 F (36.7 C)   Resp 16   Ht '6\' 2"'  (1.88 m)   Wt (!) 154.2 kg   SpO2 92%   BMI 43.65 kg/m   Physical Exam Vitals and nursing note reviewed.  70 year old male, resting comfortably and in no acute distress. Vital signs are normal. Oxygen saturation is 92%, which is normal. Head is normocephalic and atraumatic. PERRLA, EOMI. Oropharynx is clear. Neck is nontender and supple without adenopathy or JVD. Back is nontender and there is no CVA tenderness. Lungs are clear without rales, wheezes, or rhonchi. Chest is nontender. Heart has regular rate and rhythm without murmur. Abdomen is soft, flat, nontender. Extremities have no cyanosis or edema, full range of motion is present. Skin is warm and dry without rash. Neurologic: Mental status is normal, cranial nerves are intact, moves all extremities equally.  ED Results / Procedures / Treatments   Labs (all labs ordered are listed, but only abnormal results are displayed) Labs Reviewed  CBC WITH DIFFERENTIAL/PLATELET - Abnormal; Notable for the following components:      Result Value   WBC 12.2 (*)    Neutro Abs 8.6 (*)    Monocytes Absolute 1.3 (*)    All other components within normal limits  BASIC METABOLIC PANEL - Abnormal; Notable for the following components:   Sodium 131 (*)    CO2 21 (*)    Glucose, Bld 143 (*)    All other components within normal limits  TROPONIN I (HIGH SENSITIVITY)  TROPONIN I (HIGH SENSITIVITY)    EKG EKG Interpretation  Date/Time:  Monday January 11 2021 21:14:15 EST Ventricular Rate:  105 PR Interval:  195 QRS  Duration: 121 QT Interval:  348 QTC Calculation: 460 R Axis:   -28 Text Interpretation: Sinus tachycardia Nonspecific intraventricular conduction delay When compared with ECG of 01/08/2021, No significant change was found Confirmed by Delora Fuel ([id number]) on 01/11/2021 11:31:48 PM  Radiology DG Chest 2 View  Result Date: 01/12/2021 CLINICAL DATA:  Chest pain and history of pneumonia EXAM: CHEST - 2 VIEW COMPARISON:  01/08/2021 FINDINGS: Cardiac shadow is stable. Small left-sided pleural effusion is again seen. Persistent opacity is noted in the left lower lobe consistent with the patient's known mass lesion and the associated effusion. Right basilar atelectasis is noted increased from the prior exam. No other new focal abnormality is seen. IMPRESSION: New right basilar atelectasis. Left lower lobe mass consistent with the given clinical history stable from the prior exam. Small left effusion is noted. Electronically Signed   By: Inez Catalina M.D.   On: 01/12/2021 00:33    Procedures Procedures   Medications Ordered in ED Medications  albuterol (VENTOLIN HFA) 108 (90 Base) MCG/ACT inhaler 2 puff (has no administration in time range)  prochlorperazine (COMPAZINE) tablet 10 mg (has no administration in time range)  ibuprofen (ADVIL) tablet 400 mg (400 mg Oral Given 01/12/21 0021)  ondansetron (ZOFRAN-ODT) disintegrating tablet 8 mg (8 mg Oral Given 01/12/21 0022)  azithromycin (ZITHROMAX) tablet 500 mg (500 mg Oral Given 01/12/21 0021)  cefdinir (OMNICEF) capsule 600 mg (600 mg Oral Given 01/12/21 0030)  oxyCODONE-acetaminophen (PERCOCET/ROXICET) 5-325 MG per tablet 1 tablet (1 tablet Oral Given 01/12/21 0151)    ED Course  I have reviewed the triage vital signs and the nursing notes.  Pertinent labs & imaging results that were available during my care of the patient were reviewed by me and considered in my medical decision making (see chart for details).   MDM Rules/Calculators/A&P                          Chest pain of uncertain cause, possibly related to his pneumonia.  Though index of suspicion for cardiac disease.  Old records are reviewed, and although he had a chest x-ray which was suspicious for left lower lobe pneumonia, CT angiogram of the chest showed presence of a small pleural effusion and no actual infiltrate.  I suspect that his x-ray findings were secondary to the effusion.  However, he definitely should continue his course of antibiotics until completed.  He is given a dose of azithromycin and cefdinir in the emergency department today.  ECG shows no acute changes.  We will recheck chest x-ray and screening labs including troponin.  We will give a therapeutic trial of ibuprofen for pain relief.  He had no relief with ibuprofen.  He is given a dose of oxycodone-acetaminophen with modest relief.  He is complaining of recurrence of nausea and is given a dose of prochlorperazine.  Labs showed normal troponin x2, mild hyponatremia which is not felt to be clinically significant.  Chest x-ray shows possibly some slight improvement compared with most recent chest x-ray.  He is discharged with instructions to continue taking his medications as prescribed.  When his antibiotics arrived, he should complete his course of antibiotics.  Follow-up with his oncologist.  Return precautions discussed.  Final Clinical Impression(s) / ED Diagnoses Final diagnoses:  Atypical chest pain  Hyponatremia    Rx / DC Orders ED Discharge Orders          Ordered    prochlorperazine (COMPAZINE) 10 MG tablet  Every 6 hours PRN        01/12/21 1281             Delora Fuel, MD 18/86/77 0408

## 2021-01-12 ENCOUNTER — Emergency Department (HOSPITAL_COMMUNITY): Payer: No Typology Code available for payment source

## 2021-01-12 LAB — CBC WITH DIFFERENTIAL/PLATELET
Abs Immature Granulocytes: 0.06 10*3/uL (ref 0.00–0.07)
Basophils Absolute: 0 10*3/uL (ref 0.0–0.1)
Basophils Relative: 0 %
Eosinophils Absolute: 0.1 10*3/uL (ref 0.0–0.5)
Eosinophils Relative: 1 %
HCT: 43.7 % (ref 39.0–52.0)
Hemoglobin: 15.1 g/dL (ref 13.0–17.0)
Immature Granulocytes: 1 %
Lymphocytes Relative: 17 %
Lymphs Abs: 2 10*3/uL (ref 0.7–4.0)
MCH: 33 pg (ref 26.0–34.0)
MCHC: 34.6 g/dL (ref 30.0–36.0)
MCV: 95.4 fL (ref 80.0–100.0)
Monocytes Absolute: 1.3 10*3/uL — ABNORMAL HIGH (ref 0.1–1.0)
Monocytes Relative: 11 %
Neutro Abs: 8.6 10*3/uL — ABNORMAL HIGH (ref 1.7–7.7)
Neutrophils Relative %: 70 %
Platelets: 375 10*3/uL (ref 150–400)
RBC: 4.58 MIL/uL (ref 4.22–5.81)
RDW: 13.5 % (ref 11.5–15.5)
WBC: 12.2 10*3/uL — ABNORMAL HIGH (ref 4.0–10.5)
nRBC: 0 % (ref 0.0–0.2)

## 2021-01-12 LAB — TROPONIN I (HIGH SENSITIVITY)
Troponin I (High Sensitivity): 5 ng/L (ref <18)
Troponin I (High Sensitivity): 4 ng/L (ref <18)

## 2021-01-12 LAB — BASIC METABOLIC PANEL
Anion gap: 11 (ref 5–15)
BUN: 17 mg/dL (ref 8–23)
CO2: 21 mmol/L — ABNORMAL LOW (ref 22–32)
Calcium: 9.5 mg/dL (ref 8.9–10.3)
Chloride: 99 mmol/L (ref 98–111)
Creatinine, Ser: 1.04 mg/dL (ref 0.61–1.24)
GFR, Estimated: >60 mL/min (ref >60)
Glucose, Bld: 143 mg/dL — ABNORMAL HIGH (ref 70–99)
Potassium: 4.4 mmol/L (ref 3.5–5.1)
Sodium: 131 mmol/L — ABNORMAL LOW (ref 135–145)

## 2021-01-12 LAB — CULTURE, BLOOD (ROUTINE X 2): Special Requests: ADEQUATE

## 2021-01-12 MED ORDER — PROCHLORPERAZINE MALEATE 10 MG PO TABS: 10.0000 mg | ORAL_TABLET | Freq: Four times a day (QID) | ORAL | 0 refills | Status: DC | PRN

## 2021-01-12 MED ORDER — OXYCODONE-ACETAMINOPHEN 5-325 MG PO TABS
1.0000 | ORAL_TABLET | Freq: Once | ORAL | Status: AC
  Administered 2021-01-12: 1 via ORAL
  Filled 2021-01-12: qty 1

## 2021-01-12 MED ORDER — PROCHLORPERAZINE MALEATE 10 MG PO TABS
10.0000 mg | ORAL_TABLET | Freq: Once | ORAL | Status: DC
  Filled 2021-01-12: qty 1

## 2021-01-12 NOTE — Discharge Instructions (Addendum)
He is complete the course of antibiotics which had been prescribed for you.  He may take the oxycodone tablets as needed for pain -these were prescribed for you upon discharge from your most recent hospitalization.  Return if symptoms are getting worse.

## 2021-01-15 ENCOUNTER — Telehealth: Payer: Self-pay | Admitting: Radiology

## 2021-01-15 NOTE — Telephone Encounter (Signed)
Patient reports intermittent bilateral ribcage pain & pain in middle of back over last 3 weeks. Last had radiation therapy in September.

## 2021-01-18 ENCOUNTER — Ambulatory Visit (HOSPITAL_COMMUNITY)
Admission: RE | Admit: 2021-01-18 | Discharge: 2021-01-18 | Disposition: A | Discharge status: Home / Self Care | Payer: No Typology Code available for payment source | Source: Ambulatory Visit | Attending: Internal Medicine | Admitting: Internal Medicine

## 2021-01-18 ENCOUNTER — Encounter (HOSPITAL_COMMUNITY): Payer: Self-pay

## 2021-01-18 ENCOUNTER — Inpatient Hospital Stay: Payer: No Typology Code available for payment source | Attending: Internal Medicine

## 2021-01-18 ENCOUNTER — Other Ambulatory Visit: Payer: Self-pay

## 2021-01-18 DIAGNOSIS — R0781 Pleurodynia: Secondary | ICD-10-CM | POA: Diagnosis not present

## 2021-01-18 DIAGNOSIS — C3432 Malignant neoplasm of lower lobe, left bronchus or lung: Secondary | ICD-10-CM | POA: Insufficient documentation

## 2021-01-18 DIAGNOSIS — J90 Pleural effusion, not elsewhere classified: Secondary | ICD-10-CM | POA: Diagnosis not present

## 2021-01-18 DIAGNOSIS — C3490 Malignant neoplasm of unspecified part of unspecified bronchus or lung: Secondary | ICD-10-CM | POA: Diagnosis present

## 2021-01-18 LAB — CBC WITH DIFFERENTIAL (CANCER CENTER ONLY)
Abs Immature Granulocytes: 0.05 10*3/uL (ref 0.00–0.07)
Basophils Absolute: 0.1 10*3/uL (ref 0.0–0.1)
Basophils Relative: 1 %
Eosinophils Absolute: 0.1 10*3/uL (ref 0.0–0.5)
Eosinophils Relative: 1 %
HCT: 46.7 % (ref 39.0–52.0)
Hemoglobin: 15.5 g/dL (ref 13.0–17.0)
Immature Granulocytes: 1 %
Lymphocytes Relative: 13 %
Lymphs Abs: 1.4 10*3/uL (ref 0.7–4.0)
MCH: 32.1 pg (ref 26.0–34.0)
MCHC: 33.2 g/dL (ref 30.0–36.0)
MCV: 96.7 fL (ref 80.0–100.0)
Monocytes Absolute: 0.8 10*3/uL (ref 0.1–1.0)
Monocytes Relative: 8 %
Neutro Abs: 8.6 10*3/uL — ABNORMAL HIGH (ref 1.7–7.7)
Neutrophils Relative %: 76 %
Platelet Count: 338 10*3/uL (ref 150–400)
RBC: 4.83 MIL/uL (ref 4.22–5.81)
RDW: 13.3 % (ref 11.5–15.5)
WBC Count: 11 10*3/uL — ABNORMAL HIGH (ref 4.0–10.5)
nRBC: 0 % (ref 0.0–0.2)

## 2021-01-18 LAB — CMP (CANCER CENTER ONLY)
ALT: 24 U/L (ref 0–44)
AST: 16 U/L (ref 15–41)
Albumin: 4.1 g/dL (ref 3.5–5.0)
Alkaline Phosphatase: 85 U/L (ref 38–126)
Anion gap: 8 (ref 5–15)
BUN: 14 mg/dL (ref 8–23)
CO2: 24 mmol/L (ref 22–32)
Calcium: 9.4 mg/dL (ref 8.9–10.3)
Chloride: 102 mmol/L (ref 98–111)
Creatinine: 1.16 mg/dL (ref 0.61–1.24)
GFR, Estimated: >60 mL/min (ref >60)
Glucose, Bld: 164 mg/dL — ABNORMAL HIGH (ref 70–99)
Potassium: 4.8 mmol/L (ref 3.5–5.1)
Sodium: 134 mmol/L — ABNORMAL LOW (ref 135–145)
Total Bilirubin: 0.3 mg/dL (ref 0.3–1.2)
Total Protein: 7.7 g/dL (ref 6.5–8.1)

## 2021-01-18 LAB — CULTURE, BLOOD (ROUTINE X 2): Special Requests: ADEQUATE

## 2021-01-18 MED ORDER — IOHEXOL 350 MG/ML SOLN
60.0000 mL | Freq: Once | INTRAVENOUS | Status: AC | PRN
  Administered 2021-01-18: 60 mL via INTRAVENOUS

## 2021-01-18 MED ORDER — SODIUM CHLORIDE (PF) 0.9 % IJ SOLN
INTRAMUSCULAR | Status: AC
  Filled 2021-01-18: qty 50

## 2021-01-20 ENCOUNTER — Encounter (HOSPITAL_COMMUNITY): Payer: Self-pay | Admitting: Emergency Medicine

## 2021-01-20 ENCOUNTER — Emergency Department (HOSPITAL_COMMUNITY): Payer: No Typology Code available for payment source

## 2021-01-20 ENCOUNTER — Other Ambulatory Visit: Payer: Self-pay

## 2021-01-20 ENCOUNTER — Emergency Department (HOSPITAL_COMMUNITY)
Admission: EM | Admit: 2021-01-20 | Discharge: 2021-01-20 | Disposition: A | Discharge status: Home / Self Care | Payer: No Typology Code available for payment source | Attending: Emergency Medicine | Admitting: Emergency Medicine

## 2021-01-20 ENCOUNTER — Inpatient Hospital Stay (HOSPITAL_BASED_OUTPATIENT_CLINIC_OR_DEPARTMENT_OTHER): Payer: No Typology Code available for payment source | Admitting: Internal Medicine

## 2021-01-20 VITALS — BP 141/86 | HR 113 | Temp 97.9°F | Resp 19 | Ht 74.0 in | Wt 328.7 lb

## 2021-01-20 DIAGNOSIS — C3490 Malignant neoplasm of unspecified part of unspecified bronchus or lung: Secondary | ICD-10-CM

## 2021-01-20 DIAGNOSIS — E119 Type 2 diabetes mellitus without complications: Secondary | ICD-10-CM | POA: Diagnosis not present

## 2021-01-20 DIAGNOSIS — N1831 Chronic kidney disease, stage 3a: Secondary | ICD-10-CM | POA: Insufficient documentation

## 2021-01-20 DIAGNOSIS — Z7951 Long term (current) use of inhaled steroids: Secondary | ICD-10-CM | POA: Diagnosis not present

## 2021-01-20 DIAGNOSIS — Z85118 Personal history of other malignant neoplasm of bronchus and lung: Secondary | ICD-10-CM | POA: Insufficient documentation

## 2021-01-20 DIAGNOSIS — Z7984 Long term (current) use of oral hypoglycemic drugs: Secondary | ICD-10-CM | POA: Insufficient documentation

## 2021-01-20 DIAGNOSIS — Z79899 Other long term (current) drug therapy: Secondary | ICD-10-CM | POA: Insufficient documentation

## 2021-01-20 DIAGNOSIS — J449 Chronic obstructive pulmonary disease, unspecified: Secondary | ICD-10-CM | POA: Diagnosis not present

## 2021-01-20 DIAGNOSIS — I129 Hypertensive chronic kidney disease with stage 1 through stage 4 chronic kidney disease, or unspecified chronic kidney disease: Secondary | ICD-10-CM | POA: Diagnosis not present

## 2021-01-20 DIAGNOSIS — Z87891 Personal history of nicotine dependence: Secondary | ICD-10-CM | POA: Diagnosis not present

## 2021-01-20 DIAGNOSIS — Z7982 Long term (current) use of aspirin: Secondary | ICD-10-CM | POA: Diagnosis not present

## 2021-01-20 DIAGNOSIS — M549 Dorsalgia, unspecified: Secondary | ICD-10-CM

## 2021-01-20 DIAGNOSIS — C3432 Malignant neoplasm of lower lobe, left bronchus or lung: Secondary | ICD-10-CM | POA: Diagnosis not present

## 2021-01-20 DIAGNOSIS — R0781 Pleurodynia: Secondary | ICD-10-CM | POA: Diagnosis present

## 2021-01-20 MED ORDER — OXYCODONE-ACETAMINOPHEN 5-325 MG PO TABS: 1.0000 | ORAL_TABLET | Freq: Four times a day (QID) | ORAL | 0 refills | Status: DC | PRN

## 2021-01-20 MED ORDER — MORPHINE SULFATE (PF) 10 MG/ML IV SOLN
10.0000 mg | Freq: Once | INTRAVENOUS | Status: AC
  Administered 2021-01-20: 10 mg via SUBCUTANEOUS
  Filled 2021-01-20: qty 1

## 2021-01-20 MED ORDER — MORPHINE SULFATE (PF) 10 MG/ML IV SOLN
10.0000 mg | Freq: Once | INTRAVENOUS | Status: DC
  Filled 2021-01-20: qty 1

## 2021-01-20 NOTE — Progress Notes (Signed)
[place] [phone]   Fax:([phone]) [address] [place] [address]  DIAGNOSIS: stage IIb (T3, N0, M0) non-small cell lung cancer, adenocarcinoma presented with left lower lobe lung mass diagnosed in June 2022.  The patient also has suspicious pleural-based nodule and small left pleural effusion on the recent PET scan that could be concerning for metastatic disease but not completely confirmed at this point.  PRIOR THERAPY: Status post SBRT to the left lower lobe lung mass under the care of Dr. Dorathy Daft completed on 10/15/2020.  CURRENT THERAPY: Observation.  INTERVAL HISTORY: Mario [name] 70 y.o. male returns to the clinic today for follow-up visit.  The patient is feeling fine today with no concerning complaints except for pain at the lower rib cage bilaterally.  He denied having any current shortness of breath except with exertion with mild cough and no hemoptysis.  He denied having any fever or chills.  He has no nausea, vomiting, diarrhea or constipation.  He has no headache or visual changes.  He denied having any significant weight loss or night sweats.  He had repeat CT scan of the chest performed recently and is here for evaluation and discussion of his scan results.  MEDICAL HISTORY: Past Medical History:  Diagnosis Date   Anxiety    Bronchitis    COPD (chronic obstructive pulmonary disease) ([place])    Depression    History of radiation therapy    Left lung- 10/05/20-10/15/20- Dr. Gery Pray   Hypertension    lung ca 09/2020   Mental disorder    MI (myocardial infarction) ([place])    ????   OSA (obstructive sleep apnea)    Suicide attempt ([place])    Tension pneumothorax 06/27/2016   Uveitis     ALLERGIES:  is allergic to demerol [meperidine] and zocor [simvastatin].  MEDICATIONS:  Current Outpatient Medications  Medication Sig Dispense Refill    acetaminophen (TYLENOL) 500 MG tablet Take 1,000 mg by mouth every 6 (six) hours as needed for moderate pain.     albuterol (PROVENTIL) (2.5 MG/3ML) 0.083% nebulizer solution Inhale 3 mLs (2.5 mg total) by nebulization every 4 (four) hours as needed for wheezing or shortness of breath. 90 mL 2   albuterol (VENTOLIN HFA) 108 (90 Base) MCG/ACT inhaler Inhale 2 puffs into the lungs every 6 (six) hours as needed for wheezing.     aspirin 81 MG EC tablet Take 81 mg by mouth daily.     b complex vitamins capsule Take 1 capsule by mouth daily.     bisacodyl (DULCOLAX) 5 MG EC tablet Take 1 tablet (5 mg total) by mouth daily at 12 noon. (Patient taking differently: Take 5 mg by mouth daily.) 30 tablet 0   buPROPion (WELLBUTRIN XL) 300 MG 24 hr tablet Take 300 mg by mouth daily.     busPIRone (BUSPAR) 15 MG tablet Take 15 mg by mouth daily.     calcium-vitamin D (OSCAL WITH D) 500-200 MG-UNIT tablet Take 1 tablet by mouth 2 (two) times daily with a meal.     Cholecalciferol (VITAMIN D-3) 25 MCG (1000 UT) CAPS Take 1,000 Units by mouth daily.     cyclobenzaprine (FLEXERIL) 10 MG tablet Take 20 mg by mouth at bedtime.     diltiazem (CARDIZEM CD) 180 MG 24 hr capsule Take 180 mg by mouth daily.     docusate sodium (COLACE) 100 MG capsule Take 2  capsules (200 mg total) by mouth 2 (two) times daily. (Patient taking differently: Take 200 mg by mouth daily.) 10 capsule 0   DULoxetine (CYMBALTA) 60 MG capsule Take 1 capsule (60 mg total) by mouth 2 (two) times daily. 60 capsule 0   feeding supplement (ENSURE ENLIVE / ENSURE PLUS) LIQD Take 237 mLs by mouth 3 (three) times daily between meals. (Patient taking differently: Take 237 mLs by mouth daily.) 10000 mL 0   fluticasone (FLONASE) 50 MCG/ACT nasal spray Place 2 sprays into both nostrils daily.      folic acid (FOLVITE) 1 MG tablet Take 1 mg by mouth daily.     gabapentin (NEURONTIN) 300 MG capsule Take 900 mg by mouth at bedtime.     Garlic Oil 3 MG CAPS Take 3  mg by mouth daily.     lisinopril (ZESTRIL) 20 MG tablet Take 20 mg by mouth daily.     lurasidone (LATUDA) 40 MG TABS tablet Take 40 mg by mouth daily after supper.     Melatonin 3 MG TABS Take 6 mg by mouth at bedtime.     metFORMIN (GLUCOPHAGE) 500 MG tablet Take 500 mg by mouth 2 (two) times daily with a meal.     methotrexate (RHEUMATREX) 2.5 MG tablet Take 15 mg by mouth once a week. On Saturday     Multiple Vitamin (MULTIVITAMIN WITH MINERALS) TABS tablet Take 1 tablet by mouth daily.     mycophenolate (CELLCEPT) 250 MG capsule Take 1,500 mg by mouth 2 (two) times daily.     nitroGLYCERIN (NITROSTAT) 0.4 MG SL tablet Place 1 tablet (0.4 mg total) under the tongue every 5 (five) minutes as needed for chest pain. 12 tablet 12   Olodaterol HCl (STRIVERDI RESPIMAT) 2.5 MCG/ACT AERS Inhale 2 puffs into the lungs daily.     OVER THE COUNTER MEDICATION Place 1 drop into both ears daily as needed (itching). Miracell ProEar for irritated itchy ears     OVER THE COUNTER MEDICATION Take 2 tablets by mouth at bedtime. Pure Zzzz     oxcarbazepine (TRILEPTAL) 600 MG tablet Take 600-900 mg by mouth See admin instructions. Take 600mg  by mouth in the morning then 900mg  at bedtime     polyethylene glycol (MIRALAX / GLYCOLAX) 17 g packet Take 17 g by mouth daily.     polyvinyl alcohol (LIQUIFILM TEARS) 1.4 % ophthalmic solution Place 1 drop into both eyes 4 (four) times daily as needed for dry eyes.     PRESCRIPTION MEDICATION Inhale into the lungs See admin instructions. CPAP- At bedtime and during during any naps     prochlorperazine (COMPAZINE) 10 MG tablet Take 1 tablet (10 mg total) by mouth every 6 (six) hours as needed for nausea or vomiting. 20 tablet 0   Propylene Glycol (SYSTANE BALANCE) 0.6 % SOLN Place 1 drop into both eyes in the morning, at noon, and at bedtime.      sodium chloride (OCEAN) 0.65 % SOLN nasal spray Place 2 sprays into both nostrils 4 (four) times daily as needed for congestion.      tamsulosin (FLOMAX) 0.4 MG CAPS capsule Take 0.4 mg by mouth daily.     traMADol (ULTRAM) 50 MG tablet Take 100 mg by mouth at bedtime.     traZODone (DESYREL) 50 MG tablet Take 75 mg by mouth at bedtime.     VITAMIN A PO Take 1 tablet by mouth daily.     vitamin C (ASCORBIC ACID) 500 MG tablet Take  500 mg by mouth 2 (two) times daily.      No current facility-administered medications for this visit.    SURGICAL HISTORY:  Past Surgical History:  Procedure Laterality Date   BRONCHIAL BIOPSY  07/30/2020   Procedure: BRONCHIAL BIOPSIES;  Surgeon: Garner Nash, DO;  Location: [facility];  Service: Pulmonary;;   BRONCHIAL BRUSHINGS  07/30/2020   Procedure: BRONCHIAL BRUSHINGS;  Surgeon: Garner Nash, DO;  Location: [facility];  Service: Pulmonary;;   BRONCHIAL NEEDLE ASPIRATION BIOPSY  07/30/2020   Procedure: BRONCHIAL NEEDLE ASPIRATION BIOPSIES;  Surgeon: Garner Nash, DO;  Location: [facility];  Service: Pulmonary;;   BRONCHIAL WASHINGS  07/30/2020   Procedure: BRONCHIAL WASHINGS;  Surgeon: Garner Nash, DO;  Location: [facility];  Service: Pulmonary;;   CHEST TUBE INSERTION Left 06/27/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     SKIN CANCER EXCISION     VIDEO BRONCHOSCOPY WITH ENDOBRONCHIAL NAVIGATION Left 07/30/2020   Procedure: VIDEO BRONCHOSCOPY WITH ENDOBRONCHIAL NAVIGATION;  Surgeon: Garner Nash, DO;  Location: [facility];  Service: Pulmonary;  Laterality: Left;    REVIEW OF SYSTEMS:  A comprehensive review of systems was negative except for: Constitutional: positive for fatigue Respiratory: positive for dyspnea on exertion and pleurisy/chest pain   PHYSICAL EXAMINATION: General appearance: alert, cooperative, fatigued, and no distress Head: Normocephalic, without obvious abnormality, atraumatic Neck: no adenopathy, no JVD, supple, symmetrical, trachea midline, and thyroid not enlarged, symmetric, no tenderness/mass/nodules Lymph nodes: Cervical, supraclavicular, and axillary  nodes normal. Resp: clear to auscultation bilaterally Back: symmetric, no curvature. ROM normal. No CVA tenderness. Cardio: regular rate and rhythm, S1, S2 normal, no murmur, click, rub or gallop GI: soft, non-tender; bowel sounds normal; no masses,  no organomegaly Extremities: extremities normal, atraumatic, no cyanosis or edema  ECOG PERFORMANCE STATUS: 1 - Symptomatic but completely ambulatory  Blood pressure (!) 141/86, pulse (!) 113, temperature 97.9 F (36.6 C), temperature source Tympanic, resp. rate 19, height 6\' 2"  (1.88 m), weight (!) 328 lb 11.2 oz (149.1 kg), SpO2 94 %.  LABORATORY DATA: Lab Results  Component Value Date   WBC 11.0 (H) 01/18/2021   HGB 15.5 01/18/2021   HCT 46.7 01/18/2021   MCV 96.7 01/18/2021   PLT 338 01/18/2021      Chemistry      Component Value Date/Time   NA 134 (L) 01/18/2021 1058   K 4.8 01/18/2021 1058   CL 102 01/18/2021 1058   CO2 24 01/18/2021 1058   BUN 14 01/18/2021 1058   CREATININE 1.16 01/18/2021 1058      Component Value Date/Time   CALCIUM 9.4 01/18/2021 1058   ALKPHOS 85 01/18/2021 1058   AST 16 01/18/2021 1058   ALT 24 01/18/2021 1058   BILITOT 0.3 01/18/2021 1058       RADIOGRAPHIC STUDIES: DG Chest 2 View  Result Date: 01/12/2021 CLINICAL DATA:  Chest pain and history of pneumonia EXAM: CHEST - 2 VIEW COMPARISON:  01/08/2021 FINDINGS: Cardiac shadow is stable. Small left-sided pleural effusion is again seen. Persistent opacity is noted in the left lower lobe consistent with the patient's known mass lesion and the associated effusion. Right basilar atelectasis is noted increased from the prior exam. No other new focal abnormality is seen. IMPRESSION: New right basilar atelectasis. Left lower lobe mass consistent with the given clinical history stable from the prior exam. Small left effusion is noted. Electronically Signed   By: Inez Catalina M.D.   On: 01/12/2021 00:33   DG Chest 2 View  Result Date:  01/08/2021 CLINICAL DATA:  Chest pain and shortness of breath. EXAM: CHEST - 2 VIEW COMPARISON:  Chest x-ray 11/08/2020. CT chest abdomen and pelvis 12/31/2020. FINDINGS: The cardiomediastinal silhouette is within normal limits. There are patchy airspace opacities in the left lung base increased from prior exam. Bullae in the lung bases are stable from prior study. There is no pleural effusion or pneumothorax. There are no acute fractures. IMPRESSION: 1. Patchy left lower lobe airspace disease worrisome for infection. 2.  Emphysema (ICD10-J43.9). Electronically Signed   By: Ronney Asters M.D.   On: 01/08/2021 23:36   DG Chest 2 View  Result Date: 12/30/2020 CLINICAL DATA:  Shortness of breath. EXAM: CHEST - 2 VIEW COMPARISON:  Chest x-ray 12/25/2020. PET CT 09/18/2020. FINDINGS: There is a stable small left pleural effusion. There is some increasing patchy airspace disease in the left lower lobe. There is no evidence for pneumothorax. The cardiomediastinal silhouette is within normal limits. IMPRESSION: 1. Increasing left lower lobe patchy airspace disease. Findings are concerning for infection. Followup PA and lateral chest X-ray is recommended in 3-4 weeks following trial of antibiotic therapy to ensure resolution and exclude underlying malignancy. 2. Stable small left pleural effusion. Electronically Signed   By: Ronney Asters M.D.   On: 12/30/2020 15:53   CT Chest W Contrast  Result Date: 01/19/2021 CLINICAL DATA:  A 70 year old male presents with history of non-small cell lung cancer for staging evaluation/follow-up. EXAM: CT CHEST WITH CONTRAST TECHNIQUE: Multidetector CT imaging of the chest was performed during intravenous contrast administration. CONTRAST:  45mL OMNIPAQUE IOHEXOL 350 MG/ML SOLN COMPARISON:  January 09, 2021. FINDINGS: Cardiovascular: Calcified atheromatous plaque of the thoracic aorta. Three-vessel coronary artery disease. Heart size stable without substantial pericardial  effusion. Central pulmonary vasculature unremarkable on venous phase. Mediastinum/Nodes: No thoracic inlet, axillary, mediastinal or hilar adenopathy. Esophagus grossly normal. Lungs/Pleura: Marked pulmonary emphysema. Loculated LEFT-sided pleural fluid collection similar to the the prior study. 3.3 x 3.2 cm LEFT lower lobe mass measuring as large as 5 cm on previous imaging, not substantially changed compared to the exam of December 31, 2020 with some surrounding ground-glass and mild septal thickening in the setting of prior radiation. No new pulmonary nodule. No consolidation. No sign of effusion. Bullous disease in the lingula as before. The airways are patent. Upper Abdomen: Hepatic steatosis. Imaged portions the liver, gallbladder, pancreas, spleen, adrenal glands and kidneys are unremarkable. No acute gastrointestinal process to the extent evaluated. Musculoskeletal: No chest wall mass. No acute bone finding. No destructive bone process. Spinal degenerative changes. IMPRESSION: LEFT lower lobe mass remains slightly greater than 3 cm but is decreased in size compared to more remote studies and shows adjacent post treatment changes and loculated LEFT pleural effusion. These findings are not changed since December 31, 2020. Marked pulmonary emphysema. Three-vessel coronary artery disease. Hepatic steatosis. Aortic Atherosclerosis (ICD10-I70.0) and Emphysema (ICD10-J43.9). Electronically Signed   By: Zetta Bills M.D.   On: 01/19/2021 12:44   CT Chest W Contrast  Result Date: 12/31/2020 CLINICAL DATA:  Abdominal abscess or infection suspected. Increasing shortness of breath and leukocytosis. Previous history of lung cancer with resection, radiation therapy completed August 2022 EXAM: CT CHEST, ABDOMEN, AND PELVIS WITH CONTRAST TECHNIQUE: Multidetector CT imaging of the chest, abdomen and pelvis was performed following the standard protocol during bolus administration of intravenous contrast. CONTRAST:  147mL  OMNIPAQUE IOHEXOL 350 MG/ML SOLN COMPARISON:  CTA chest 09/03/2020, CT abdomen pelvis without contrast 07/25/2020, CT abdomen pelvis with contrast  04/03/2020 FINDINGS: CT CHEST FINDINGS Cardiovascular: Normal cardiac size. Patchy three-vessel calcific CAD is again noted. There is no pericardial effusion. No evidence of central pulmonary arterial embolism. There is no aortic aneurysm or dissection. There is patchy mixed plaque in the descending aortic segment. The great vessels are widely patent. Mediastinum/Nodes: No intrathoracic or axillary adenopathy or thyroid mass. Lungs/Pleura: There is a small but increasing partially loculated left pleural effusion posteriorly. Centrilobular, paraseptal and panlobular emphysematous changes are again seen as well as bullous disease in both bases, scattered linear scar-like opacities. Posterolateral left lower lobe mass is again noted, today measuring 4.4 x 3.8 cm, previously 4.1 x 3.0 cm. There is posterior atelectasis in both lower lobes. No focal pneumonia is seen. Musculoskeletal: 1 there is osteopenia and multilevel thoracic spine bridging enthesopathy. No destructive thoracic bone lesion is seen. CT ABDOMEN PELVIS FINDINGS Hepatobiliary: The liver is 18 cm length mildly steatotic, showing a slightly cirrhotic configuration; the gallbladder is distended to nearly 13 cm in length but is without wall thickening or appreciable calcified stones. A 2.7 cm cyst in the anterior segment of the right lobe is redemonstrated without mass enhancement. Pancreas: Unremarkable. No pancreatic ductal dilatation or surrounding inflammatory changes. Spleen: Normal. Adrenals/Urinary Tract: There are small renal cysts without mass, calculus or hydronephrosis. No adrenal mass is seen. Enlarged prostate measures 5.9 cm with bladder base impression but bladder is unremarkable. Stomach/Bowel: The gastric wall is normal. The unopacified small bowel is unremarkable. There is no appendiceal  dilatation or inflammatory change. There is moderate to severe stool retention. There are colonic diverticula greatest in sigmoid segment. Trace interstitial haziness in the perisigmoid fat is similar to the prior studies and could be due to a fat scarring from old diverticulitis or an early acute diverticulitis, but there is no free air, free fluid or abscess. Vascular/Lymphatic: Aortic atherosclerosis. No enlarged abdominal or pelvic lymph nodes. Reproductive: Enlarged prostate, 5.9 cm transverse Other: There are small umbilical and inguinal fat hernias. There is no free fluid, free air or free hemorrhage Musculoskeletal: There is osteopenia, degenerative changes and slight levoscoliosis of the lumbar spine. There is ankylosis across the right SI joint. IMPRESSION: 1. Small but enlarging partially loculated posterior left pleural effusion with left lower lobe mass slightly larger than previously. Likely enlarging neoplasm. 2. COPD changes without visible acute infiltrate. 3. Vascular calcifications including coronary arteries. 4. Moderate to severe constipation, with diverticulosis greatest in the sigmoid, with Perisigmoid faint interstitial changes which could be due to fat scarring from old diverticulitis or early acute diverticulitis. Similar findings were present on the prior studies. 5. Fatty liver with a slightly cirrhotic configuration. No splenomegaly. No ascites. 6. Enlarged prostate. 7. Umbilical and inguinal fat hernias. 8. Osteopenia and degenerative and DISH changes. No destructive bone lesion is seen. 9. Distended but otherwise unremarkable gallbladder. Electronically Signed   By: Telford Nab M.D.   On: 12/31/2020 02:50   CT Angio Chest PE W and/or Wo Contrast  Result Date: 01/09/2021 CLINICAL DATA:  Chest/back pain EXAM: CT ANGIOGRAPHY CHEST WITH CONTRAST TECHNIQUE: Multidetector CT imaging of the chest was performed using the standard protocol during bolus administration of intravenous  contrast. Multiplanar CT image reconstructions and MIPs were obtained to evaluate the vascular anatomy. CONTRAST:  73mL OMNIPAQUE IOHEXOL 350 MG/ML SOLN COMPARISON:  Chest radiographs dated 01/08/2021. CT chest dated 12/31/2020. FINDINGS: Cardiovascular: Satisfactory opacification the bilateral pulmonary arteries to the segmental level. No evidence of pulmonary embolism. Although not tailored for evaluation of the thoracic  aorta, there is no evidence of thoracic aneurysm or dissection. The heart is normal in size.  No pericardial effusion. Three vessel coronary atherosclerosis. Mediastinum/Nodes: No suspicious mediastinal lymphadenopathy. Visualized thyroid is unremarkable. Lungs/Pleura: Evaluation of the lung parenchyma is constrained by respiratory motion. 3.6 x 3.1 cm mass in the left lower lobe (series 6/image 112), poorly evaluated on the current study, corresponding to the patient's known primary bronchogenic neoplasm. Moderate emphysematous changes with bullous changes in the bilateral lower lungs. Small left pleural effusion. No pneumothorax. Upper Abdomen: Visualized upper abdomen is grossly unremarkable. Musculoskeletal: Degenerative changes of the visualized thoracolumbar spine. Review of the MIP images confirms the above findings. IMPRESSION: No evidence of pulmonary embolism. 3.6 cm mass in the left lower lobe, poorly evaluated on the current study due to motion degradation, but corresponding to the patient's known primary bronchogenic neoplasm. Small left pleural effusion. Aortic Atherosclerosis (ICD10-I70.0) and Emphysema (ICD10-J43.9). Electronically Signed   By: Julian Hy M.D.   On: 01/09/2021 03:51   CT Lumbar Spine Wo Contrast  Result Date: 12/31/2020 CLINICAL DATA:  Low back pain with increased fracture risk EXAM: CT LUMBAR SPINE WITHOUT CONTRAST TECHNIQUE: Multidetector CT imaging of the lumbar spine was performed without intravenous contrast administration. Multiplanar CT image  reconstructions were also generated. COMPARISON:  None. 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 Alignment: Normal. Vertebrae: No acute fracture or focal pathologic process. Paraspinal and other soft tissues: Calcific aortic atherosclerosis. Disc levels: Multilevel degenerative disc disease and facet arthrosis. No high-grade spinal canal stenosis. No neural impingement. IMPRESSION: 1. No acute fracture or static subluxation of the lumbar spine. 2. 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 Aortic Atherosclerosis (ICD10-I70.0). Electronically Signed   By: Ulyses Jarred M.D.   On: 12/31/2020 02:22   CT ABDOMEN PELVIS W CONTRAST  Result Date: 12/31/2020 CLINICAL DATA:  Abdominal abscess or infection suspected. Increasing shortness of breath and leukocytosis. Previous history of lung cancer with resection, radiation therapy completed August 2022 EXAM: CT CHEST, ABDOMEN, AND PELVIS WITH CONTRAST TECHNIQUE: Multidetector CT imaging of the chest, abdomen and pelvis was performed following the standard protocol during bolus administration of intravenous contrast. CONTRAST:  136mL OMNIPAQUE IOHEXOL 350 MG/ML SOLN COMPARISON:  CTA chest 09/03/2020, CT abdomen pelvis without contrast 07/25/2020, CT abdomen pelvis with contrast 04/03/2020 FINDINGS: CT CHEST FINDINGS Cardiovascular: Normal cardiac size. Patchy three-vessel calcific CAD is again noted. There is no pericardial effusion. No evidence of central pulmonary arterial embolism. There is no aortic aneurysm or dissection. There is patchy mixed plaque in the descending aortic segment. The great vessels are widely patent. Mediastinum/Nodes: No intrathoracic or axillary adenopathy or thyroid mass. Lungs/Pleura: There is a small but increasing partially loculated left pleural effusion posteriorly. Centrilobular, paraseptal and panlobular  emphysematous changes are again seen as well as bullous disease in both bases, scattered linear scar-like opacities. Posterolateral left lower lobe mass is again noted, today measuring 4.4 x 3.8 cm, previously 4.1 x 3.0 cm. There is posterior atelectasis in both lower lobes. No focal pneumonia is seen. Musculoskeletal: 1 there is osteopenia and multilevel thoracic spine bridging enthesopathy. No destructive thoracic bone lesion is seen. CT ABDOMEN PELVIS FINDINGS Hepatobiliary: The liver is 18 cm length mildly steatotic, showing a slightly cirrhotic configuration; the gallbladder is distended to nearly 13 cm in length but is without wall thickening or appreciable calcified stones. A 2.7 cm cyst in the anterior segment of the right lobe  is redemonstrated without mass enhancement. Pancreas: Unremarkable. No pancreatic ductal dilatation or surrounding inflammatory changes. Spleen: Normal. Adrenals/Urinary Tract: There are small renal cysts without mass, calculus or hydronephrosis. No adrenal mass is seen. Enlarged prostate measures 5.9 cm with bladder base impression but bladder is unremarkable. Stomach/Bowel: The gastric wall is normal. The unopacified small bowel is unremarkable. There is no appendiceal dilatation or inflammatory change. There is moderate to severe stool retention. There are colonic diverticula greatest in sigmoid segment. Trace interstitial haziness in the perisigmoid fat is similar to the prior studies and could be due to a fat scarring from old diverticulitis or an early acute diverticulitis, but there is no free air, free fluid or abscess. Vascular/Lymphatic: Aortic atherosclerosis. No enlarged abdominal or pelvic lymph nodes. Reproductive: Enlarged prostate, 5.9 cm transverse Other: There are small umbilical and inguinal fat hernias. There is no free fluid, free air or free hemorrhage Musculoskeletal: There is osteopenia, degenerative changes and slight levoscoliosis of the lumbar spine. There  is ankylosis across the right SI joint. IMPRESSION: 1. Small but enlarging partially loculated posterior left pleural effusion with left lower lobe mass slightly larger than previously. Likely enlarging neoplasm. 2. COPD changes without visible acute infiltrate. 3. Vascular calcifications including coronary arteries. 4. Moderate to severe constipation, with diverticulosis greatest in the sigmoid, with Perisigmoid faint interstitial changes which could be due to fat scarring from old diverticulitis or early acute diverticulitis. Similar findings were present on the prior studies. 5. Fatty liver with a slightly cirrhotic configuration. No splenomegaly. No ascites. 6. Enlarged prostate. 7. Umbilical and inguinal fat hernias. 8. Osteopenia and degenerative and DISH changes. No destructive bone lesion is seen. 9. Distended but otherwise unremarkable gallbladder. Electronically Signed   By: Telford Nab M.D.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

## 2021-01-20 NOTE — Discharge Instructions (Signed)
Restart taking your pain medications as directed.  Follow-up with your doctors next week

## 2021-01-20 NOTE — ED Notes (Addendum)
[facility] called as pt requested.

## 2021-01-20 NOTE — ED Provider Notes (Signed)
[facility] DEPT Provider Note   CSN: [id number] Arrival date & time: 01/20/21  1928     History Chief Complaint  Patient presents with   Back Pain    Mario [name] is a 70 y.o. male.  70 year old male with history of COPD as well as lung cancer who presents with chronic pain in his posterior ribs for almost a month.  Has been seen by his physician for this most recently several days ago and had a CT of his chest which did not show any evidence of pathologic fracture.  He denies any new neurological changes.  Patient is chronically on oxygen and denies any new dyspnea.  No hemoptysis.  No fever or cough.  No anginal or CHF symptoms.  No new leg pain or swelling.  Pain is worse with certain positions.  Has been prescribed OxyContin IR but has not used this medication      Past Medical History:  Diagnosis Date   Anxiety    Bronchitis    COPD (chronic obstructive pulmonary disease) ([place])    Depression    History of radiation therapy    Left lung- 10/05/20-10/15/20- Dr. Gery Pray   Hypertension    lung ca 09/2020   Mental disorder    MI (myocardial infarction) ([place])    ????   OSA (obstructive sleep apnea)    Suicide attempt [facility])    Tension pneumothorax 06/27/2016   Uveitis     Patient Active Problem List   Diagnosis Date Noted   Non-small cell cancer of left lung ([place]) 09/21/2020   Lung nodule 07/30/2020   Mass of left lung    Acute respiratory failure with hypoxemia ([place]) 07/26/2020   Constipation    CAP (community acquired pneumonia) 04/03/2020   COPD with acute exacerbation ([place]) 01/05/2020   Vertigo 12/26/2019   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   Stage 3a chronic kidney disease ([place]) 12/26/2019   Abnormal CT of the chest 12/26/2019   COPD (chronic obstructive pulmonary disease) (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   Cellulitis of both feet 07/16/2017   DM2 (diabetes mellitus, type 2) ([place]) 07/16/2017   HLA B27 (HLA B27 positive) 07/16/2017   Acute chest pain    Hyperkalemia    Spontaneous pneumothorax 06/27/2016   Hypoxia    Pneumothorax on left    Dyspnea 05/23/2014   COPD exacerbation ([place]) 05/23/2014   Malingering 01/21/2013   Depression 01/11/2013   Tobacco abuse 01/11/2013   Obesity, unspecified 01/11/2013   Chest pain 01/10/2013   HTN (hypertension) 01/10/2013    Past Surgical History:  Procedure Laterality Date   BRONCHIAL BIOPSY  07/30/2020   Procedure: BRONCHIAL BIOPSIES;  Surgeon: Garner Nash, DO;  Location: [facility];  Service: Pulmonary;;   BRONCHIAL BRUSHINGS  07/30/2020   Procedure: BRONCHIAL BRUSHINGS;  Surgeon: Garner Nash, DO;  Location: [facility];  Service: Pulmonary;;   BRONCHIAL NEEDLE ASPIRATION BIOPSY  07/30/2020   Procedure: BRONCHIAL NEEDLE ASPIRATION BIOPSIES;  Surgeon: Garner Nash, DO;  Location: [facility];  Service: Pulmonary;;   BRONCHIAL WASHINGS  07/30/2020   Procedure: BRONCHIAL WASHINGS;  Surgeon: Garner Nash, DO;  Location: [facility];  Service: Pulmonary;;   CHEST TUBE INSERTION Left 06/27/2016  cryptorchidism     SKIN CANCER EXCISION     VIDEO BRONCHOSCOPY WITH ENDOBRONCHIAL NAVIGATION Left 07/30/2020   Procedure: VIDEO BRONCHOSCOPY WITH ENDOBRONCHIAL NAVIGATION;  Surgeon: Garner Nash, DO;  Location: [facility];  Service: Pulmonary;  Laterality: Left;       Family History  Problem Relation Age of Onset   Dementia Father     Social History   Tobacco Use   Smoking status: Former    Packs/day: 1.00    Years: 35.00    Pack years: 35.00    Types: Cigarettes    Quit date: 05/2016    Years since quitting: 4.6   Smokeless tobacco: Never  Vaping Use   Vaping Use: Never used   Substance Use Topics   Alcohol use: No    Alcohol/week: 0.0 standard drinks    Comment: denies use of any drugs or alcohol   Drug use: No    Home Medications Prior to Admission medications   Medication Sig Start Date End Date Taking? Authorizing Provider  acetaminophen (TYLENOL) 500 MG tablet Take 1,000 mg by mouth every 6 (six) hours as needed for moderate pain.    [provider]  albuterol (PROVENTIL) (2.5 MG/3ML) 0.083% nebulizer solution Inhale 3 mLs (2.5 mg total) by nebulization every 4 (four) hours as needed for wheezing or shortness of breath. 08/04/20 08/04/21  Lavina Hamman, MD  albuterol (VENTOLIN HFA) 108 (90 Base) MCG/ACT inhaler Inhale 2 puffs into the lungs every 6 (six) hours as needed for wheezing. 06/02/20   [provider]  aspirin 81 MG EC tablet Take 81 mg by mouth daily.    [provider]  b complex vitamins capsule Take 1 capsule by mouth daily.    [provider]  bisacodyl (DULCOLAX) 5 MG EC tablet Take 1 tablet (5 mg total) by mouth daily at 12 noon. Patient taking differently: Take 5 mg by mouth daily. 08/04/20   Lavina Hamman, MD  buPROPion (WELLBUTRIN XL) 300 MG 24 hr tablet Take 300 mg by mouth daily. 02/05/20   [provider]  busPIRone (BUSPAR) 15 MG tablet Take 15 mg by mouth daily.    [provider]  calcium-vitamin D (OSCAL WITH D) 500-200 MG-UNIT tablet Take 1 tablet by mouth 2 (two) times daily with a meal.    [provider]  Cholecalciferol (VITAMIN D-3) 25 MCG (1000 UT) CAPS Take 1,000 Units by mouth daily.    [provider]  cyclobenzaprine (FLEXERIL) 10 MG tablet Take 20 mg by mouth at bedtime.    [provider]  diltiazem (CARDIZEM CD) 180 MG 24 hr capsule Take 180 mg by mouth daily.    [provider]  docusate sodium (COLACE) 100 MG capsule Take 2 capsules (200 mg total) by mouth 2 (two) times daily. Patient taking differently: Take 200 mg by mouth daily.  08/04/20   Lavina Hamman, MD  DULoxetine (CYMBALTA) 60 MG capsule Take 1 capsule (60 mg total) by mouth 2 (two) times daily. 07/21/17   Lavina Hamman, MD  feeding supplement (ENSURE ENLIVE / ENSURE PLUS) LIQD Take 237 mLs by mouth 3 (three) times daily between meals. Patient taking differently: Take 237 mLs by mouth daily. 08/04/20   Lavina Hamman, MD  fluticasone Asencion Islam) 50 MCG/ACT nasal spray Place 2 sprays into both nostrils daily.     [provider]  folic acid (FOLVITE) 1 MG tablet Take 1 mg by mouth daily.    [provider]  gabapentin (NEURONTIN) 300 MG capsule Take 900 mg by mouth at bedtime.    [provider]  Garlic Oil 3 MG CAPS Take 3 mg by mouth daily.    [provider]  lisinopril (ZESTRIL) 20 MG tablet Take 20 mg by mouth daily.    [provider]  lurasidone (LATUDA) 40 MG TABS tablet Take 40 mg by mouth daily after supper.    [provider]  Melatonin 3 MG TABS Take 6 mg by mouth at bedtime.    [provider]  metFORMIN (GLUCOPHAGE) 500 MG tablet Take 500 mg by mouth 2 (two) times daily with a meal.    [provider]  methotrexate (RHEUMATREX) 2.5 MG tablet Take 15 mg by mouth once a week. On Saturday    [provider]  Multiple Vitamin (MULTIVITAMIN WITH MINERALS) TABS tablet Take 1 tablet by mouth daily.    [provider]  mycophenolate (CELLCEPT) 250 MG capsule Take 1,500 mg by mouth 2 (two) times daily.    [provider]  nitroGLYCERIN (NITROSTAT) 0.4 MG SL tablet Place 1 tablet (0.4 mg total) under the tongue every 5 (five) minutes as needed for chest pain. 07/03/19   Danford, Suann Larry, MD  Olodaterol HCl (STRIVERDI RESPIMAT) 2.5 MCG/ACT AERS Inhale 2 puffs into the lungs daily.    [provider]  OVER THE COUNTER MEDICATION Place 1 drop into both ears daily as needed (itching). Miracell ProEar for irritated itchy ears    [provider]  OVER  THE COUNTER MEDICATION Take 2 tablets by mouth at bedtime. Pure Zzzz    [provider]  oxcarbazepine (TRILEPTAL) 600 MG tablet Take 600-900 mg by mouth See admin instructions. Take 691m by mouth in the morning then 904mat bedtime 07/16/20   [provider]  polyethylene glycol (MIRALAX / GLYCOLAX) 17 g packet Take 17 g by mouth daily.    [provider]  polyvinyl alcohol (LIQUIFILM TEARS) 1.4 % ophthalmic solution Place 1 drop into both eyes 4 (four) times daily as needed for dry eyes.    [provider]  PRESCRIPTION MEDICATION Inhale into the lungs See admin instructions. CPAP- At bedtime and during during any naps    [provider]  prochlorperazine (COMPAZINE) 10 MG tablet Take 1 tablet (10 mg total) by mouth every 6 (six) hours as needed for nausea or vomiting. 1179/39/03 GlDelora FuelMD  Propylene Glycol (SYSTANE BALANCE) 0.6 % SOLN Place 1 drop into both eyes in the morning, at noon, and at bedtime.     [provider]  sodium chloride (OCEAN) 0.65 % SOLN nasal spray Place 2 sprays into both nostrils 4 (four) times daily as needed for congestion.    [provider]  tamsulosin (FLOMAX) 0.4 MG CAPS capsule Take 0.4 mg by mouth daily.    [provider]  traMADol (ULTRAM) 50 MG tablet Take 100 mg by mouth at bedtime.    [provider]  traZODone (DESYREL) 50 MG tablet Take 75 mg by mouth at bedtime.    [provider]  VITAMIN A PO Take 1 tablet by mouth daily.    [provider]  vitamin C (ASCORBIC ACID) 500 MG tablet Take 500 mg by mouth 2 (two) times daily.     [provider]    Allergies    Demerol [meperidine] and Zocor [simvastatin]  Review of Systems   Review of Systems  All other systems reviewed  and are negative.  Physical Exam Updated Vital Signs BP (!) 153/92 (BP Location: Left Arm)   Pulse (!) 109   Temp 98.7 F (37.1 C) (Oral)   Resp 18   Ht 1.88 m (6'  2")   Wt (!) 147 kg   SpO2 93%   BMI 41.60 kg/m   Physical Exam Vitals and nursing note reviewed.  Constitutional:      General: He is not in acute distress.    Appearance: Normal appearance. He is well-developed. He is not toxic-appearing.  HENT:     Head: Normocephalic and atraumatic.  Eyes:     General: Lids are normal.     Conjunctiva/sclera: Conjunctivae normal.     Pupils: Pupils are equal, round, and reactive to light.  Neck:     Thyroid: No thyroid mass.     Trachea: No tracheal deviation.  Cardiovascular:     Rate and Rhythm: Normal rate and regular rhythm.     Heart sounds: Normal heart sounds. No murmur heard.   No gallop.  Pulmonary:     Effort: Pulmonary effort is normal. No respiratory distress.     Breath sounds: Normal breath sounds. No stridor. No decreased breath sounds, wheezing, rhonchi or rales.  Abdominal:     General: There is no distension.     Palpations: Abdomen is soft.     Tenderness: There is no abdominal tenderness. There is no rebound.  Musculoskeletal:        General: No tenderness. Normal range of motion.     Cervical back: Normal range of motion and neck supple.  Skin:    General: Skin is warm and dry.     Findings: No abrasion or rash.  Neurological:     Mental Status: He is alert and oriented to person, place, and time. Mental status is at baseline.     GCS: GCS eye subscore is 4. GCS verbal subscore is 5. GCS motor subscore is 6.     Cranial Nerves: No cranial nerve deficit.     Sensory: No sensory deficit.     Motor: Motor function is intact.  Psychiatric:        Attention and Perception: Attention normal.        Speech: Speech normal.        Behavior: Behavior normal.    ED Results / Procedures / Treatments   Labs (all labs ordered are listed, but only abnormal results are displayed) Labs Reviewed - No data to display  EKG None  Radiology No results found.  Procedures Procedures   Medications Ordered in  ED Medications  morphine sulfate (PF) 10 MG/ML injection 10 mg (has no administration in time range)    ED Course  I have reviewed the triage vital signs and the nursing notes.  Pertinent labs & imaging results that were available during my care of the patient were reviewed by me and considered in my medical decision making (see chart for details).    MDM Rules/Calculators/A&P                           Patient's chest cT for 2 days ago reviewed and no evidence of pathologic fracture.  Patient's current symptoms are unchanged from what he has been experiencing for the past several weeks.  Patient given dose of IM morphine here.  Patient encouraged to follow-up with his physicians Final Clinical Impression(s) / ED Diagnoses Final diagnoses:  None  Rx / DC Orders ED Discharge Orders     None        Lacretia Leigh, MD 01/20/21 2004

## 2021-01-20 NOTE — ED Triage Notes (Signed)
Patient BIB [facility], upper back pain x 1 month, patient reported to think back pain is from radiation from lung cancer. Pain rated 8/10. Last radiation was September.

## 2021-01-25 ENCOUNTER — Telehealth: Payer: Self-pay | Admitting: Internal Medicine

## 2021-01-25 NOTE — Telephone Encounter (Signed)
Scheduled follow-up appointments per 11/23 los. Patient is aware. Mailed calendar.

## 2021-01-27 ENCOUNTER — Encounter: Payer: Self-pay | Admitting: Family

## 2021-02-03 ENCOUNTER — Observation Stay (HOSPITAL_COMMUNITY)
Admission: EM | Admit: 2021-02-03 | Discharge: 2021-02-04 | Disposition: A | Discharge status: Home / Self Care | Payer: No Typology Code available for payment source | Attending: Internal Medicine | Admitting: Internal Medicine

## 2021-02-03 ENCOUNTER — Emergency Department (HOSPITAL_COMMUNITY): Payer: No Typology Code available for payment source

## 2021-02-03 ENCOUNTER — Encounter (HOSPITAL_COMMUNITY): Payer: Self-pay | Admitting: Oncology

## 2021-02-03 ENCOUNTER — Other Ambulatory Visit: Payer: Self-pay

## 2021-02-03 DIAGNOSIS — I10 Essential (primary) hypertension: Secondary | ICD-10-CM | POA: Diagnosis not present

## 2021-02-03 DIAGNOSIS — E1159 Type 2 diabetes mellitus with other circulatory complications: Secondary | ICD-10-CM | POA: Diagnosis present

## 2021-02-03 DIAGNOSIS — Z7984 Long term (current) use of oral hypoglycemic drugs: Secondary | ICD-10-CM | POA: Diagnosis not present

## 2021-02-03 DIAGNOSIS — Z87891 Personal history of nicotine dependence: Secondary | ICD-10-CM | POA: Insufficient documentation

## 2021-02-03 DIAGNOSIS — J449 Chronic obstructive pulmonary disease, unspecified: Secondary | ICD-10-CM | POA: Diagnosis present

## 2021-02-03 DIAGNOSIS — Z79899 Other long term (current) drug therapy: Secondary | ICD-10-CM | POA: Insufficient documentation

## 2021-02-03 DIAGNOSIS — Z7982 Long term (current) use of aspirin: Secondary | ICD-10-CM | POA: Diagnosis not present

## 2021-02-03 DIAGNOSIS — Z85118 Personal history of other malignant neoplasm of bronchus and lung: Secondary | ICD-10-CM | POA: Insufficient documentation

## 2021-02-03 DIAGNOSIS — I2699 Other pulmonary embolism without acute cor pulmonale: Secondary | ICD-10-CM | POA: Diagnosis not present

## 2021-02-03 DIAGNOSIS — G4733 Obstructive sleep apnea (adult) (pediatric): Secondary | ICD-10-CM | POA: Diagnosis present

## 2021-02-03 DIAGNOSIS — Z20822 Contact with and (suspected) exposure to covid-19: Secondary | ICD-10-CM | POA: Diagnosis not present

## 2021-02-03 DIAGNOSIS — F39 Unspecified mood [affective] disorder: Secondary | ICD-10-CM

## 2021-02-03 DIAGNOSIS — E119 Type 2 diabetes mellitus without complications: Secondary | ICD-10-CM | POA: Diagnosis not present

## 2021-02-03 DIAGNOSIS — R0602 Shortness of breath: Secondary | ICD-10-CM | POA: Diagnosis present

## 2021-02-03 DIAGNOSIS — C3492 Malignant neoplasm of unspecified part of left bronchus or lung: Secondary | ICD-10-CM | POA: Diagnosis present

## 2021-02-03 DIAGNOSIS — R0600 Dyspnea, unspecified: Secondary | ICD-10-CM

## 2021-02-03 LAB — BASIC METABOLIC PANEL
Anion gap: 9 (ref 5–15)
BUN: 18 mg/dL (ref 8–23)
CO2: 25 mmol/L (ref 22–32)
Calcium: 9.3 mg/dL (ref 8.9–10.3)
Chloride: 101 mmol/L (ref 98–111)
Creatinine, Ser: 0.98 mg/dL (ref 0.61–1.24)
GFR, Estimated: >60 mL/min (ref >60)
Glucose, Bld: 138 mg/dL — ABNORMAL HIGH (ref 70–99)
Potassium: 4.5 mmol/L (ref 3.5–5.1)
Sodium: 135 mmol/L (ref 135–145)

## 2021-02-03 LAB — CBC
HCT: 47 % (ref 39.0–52.0)
Hemoglobin: 15.6 g/dL (ref 13.0–17.0)
MCH: 32.6 pg (ref 26.0–34.0)
MCHC: 33.2 g/dL (ref 30.0–36.0)
MCV: 98.3 fL (ref 80.0–100.0)
Platelets: 317 10*3/uL (ref 150–400)
RBC: 4.78 MIL/uL (ref 4.22–5.81)
RDW: 13.3 % (ref 11.5–15.5)
WBC: 10.9 10*3/uL — ABNORMAL HIGH (ref 4.0–10.5)
nRBC: 0 % (ref 0.0–0.2)

## 2021-02-03 LAB — PROTIME-INR
INR: 1 (ref 0.8–1.2)
Prothrombin Time: 12.9 seconds (ref 11.4–15.2)

## 2021-02-03 LAB — TROPONIN I (HIGH SENSITIVITY)
Troponin I (High Sensitivity): 5 ng/L (ref <18)
Troponin I (High Sensitivity): 5 ng/L (ref <18)

## 2021-02-03 LAB — RESP PANEL BY RT-PCR (FLU A&B, COVID) ARPGX2
Influenza A by PCR: NEGATIVE
Influenza B by PCR: NEGATIVE
SARS Coronavirus 2 by RT PCR: NEGATIVE

## 2021-02-03 LAB — APTT: aPTT: 29 seconds (ref 24–36)

## 2021-02-03 MED ORDER — HEPARIN BOLUS VIA INFUSION
4000.0000 [IU] | Freq: Once | INTRAVENOUS | Status: AC
  Administered 2021-02-03: 4000 [IU] via INTRAVENOUS
  Filled 2021-02-03: qty 4000

## 2021-02-03 MED ORDER — ONDANSETRON 4 MG PO TBDP
4.0000 mg | ORAL_TABLET | Freq: Once | ORAL | Status: AC
  Administered 2021-02-03: 4 mg via ORAL
  Filled 2021-02-03: qty 1

## 2021-02-03 MED ORDER — TRAMADOL HCL 50 MG PO TABS
100.0000 mg | ORAL_TABLET | Freq: Every day | ORAL | Status: DC
  Administered 2021-02-03: 100 mg via ORAL
  Filled 2021-02-03: qty 2

## 2021-02-03 MED ORDER — IOHEXOL 350 MG/ML SOLN
100.0000 mL | Freq: Once | INTRAVENOUS | Status: AC | PRN
  Administered 2021-02-03: 100 mL via INTRAVENOUS

## 2021-02-03 MED ORDER — HEPARIN (PORCINE) 25000 UT/250ML-% IV SOLN
1800.0000 [IU]/h | INTRAVENOUS | Status: DC
  Administered 2021-02-03: 1800 [IU]/h via INTRAVENOUS
  Filled 2021-02-03: qty 250

## 2021-02-03 NOTE — H&P (Signed)
History and Physical    Mario [name]:[id number] DOB: [birth year] DOA: 02/03/2021  PCP: [facility]  Patient coming from: Home  I have personally briefly reviewed patient's old medical records in [place]  Chief Complaint: Shortness of breath  HPI: Mario [name] is a 70 y.o. male with medical history significant for COPD, chronic hypoxic respiratory failure on 3 L O2 via [place] as needed, non-small cell lung cancer (left lower lobe lung mass) s/p SBRT, T2DM, HTN, HLD, mood disorder, uveitis, ankylosing spondylitis, chronic pain syndrome, OSA on CPAP who presented to the ED for evaluation of shortness of breath.  Patient reports pleuritic chest discomfort at the right lower ribs anteriorly and at his back around 4 PM on 12/7.  He does report chronic back pain from prior injury and ankylosing spondylitis however pleuritic changes are new from his baseline.  He also noticed some worsening exertional dyspnea compared to his baseline.  He has not had any significant chest pain, nausea, vomiting, abdominal pain, dysuria, diarrhea, peripheral edema, or obvious bleeding.  ED Course:  Initial vitals showed BP 140/102, pulse 112, RR 21, temp 98.9 F, SPO2 94% on 4 L supplemental O2 via [place].  Labs show WBC 10.9, hemoglobin 15.6, platelets 317,000, sodium 135, potassium 4.5, bicarb 25, BUN 18, creatinine 0.98, serum glucose 138, high-sensitivity troponin 5.  SARS-CoV-2 and influenza PCR negative.  Portable chest x-ray showed increased airspace disease at the left lung base.  CTA chest PE study was limited by respiratory motion but positive for the presence of acute PE within the segmental pulmonary arteries of the right lower lobe.  No CT evidence of right heart strain seen.  Severe emphysematous changes noted.  Known left lower lobe pulmonary mass is grossly unchanged.  Patient was started on IV heparin and the hospitalist service was consulted to admit for further evaluation  and management.  Review of Systems: All systems reviewed and are negative except as documented in history of present illness above.   Past Medical History:  Diagnosis Date   Anxiety    Bronchitis    COPD (chronic obstructive pulmonary disease) ([place])    Depression    History of radiation therapy    Left lung- 10/05/20-10/15/20- Dr. Gery Pray   Hypertension    lung ca 09/2020   Mental disorder    MI (myocardial infarction) ([place])    ????   OSA (obstructive sleep apnea)    Suicide attempt [facility])    Tension pneumothorax 06/27/2016   Uveitis     Past Surgical History:  Procedure Laterality Date   BRONCHIAL BIOPSY  07/30/2020   Procedure: BRONCHIAL BIOPSIES;  Surgeon: Garner Nash, DO;  Location: [name];  Service: Pulmonary;;   BRONCHIAL BRUSHINGS  07/30/2020   Procedure: BRONCHIAL BRUSHINGS;  Surgeon: Garner Nash, DO;  Location: [facility];  Service: Pulmonary;;   BRONCHIAL NEEDLE ASPIRATION BIOPSY  07/30/2020   Procedure: BRONCHIAL NEEDLE ASPIRATION BIOPSIES;  Surgeon: Garner Nash, DO;  Location: [facility];  Service: Pulmonary;;   BRONCHIAL WASHINGS  07/30/2020   Procedure: BRONCHIAL WASHINGS;  Surgeon: Garner Nash, DO;  Location: [facility];  Service: Pulmonary;;   CHEST TUBE INSERTION Left 06/27/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     SKIN CANCER EXCISION     VIDEO BRONCHOSCOPY WITH ENDOBRONCHIAL NAVIGATION Left 07/30/2020   Procedure: VIDEO BRONCHOSCOPY WITH ENDOBRONCHIAL NAVIGATION;  Surgeon: Garner Nash, DO;  Location: [facility];  Service: Pulmonary;  Laterality: Left;    Social History:  reports that he quit smoking about 4 years ago. His smoking use included cigarettes. He has a 35.00 pack-year smoking history. He has never used smokeless tobacco. He reports that he does not drink alcohol and does not use drugs.  Allergies  Allergen Reactions   Demerol [Meperidine] Nausea And Vomiting and Other (See Comments)    Made the patient "violently sick"    Zocor [Simvastatin] Nausea And Vomiting and Other (See Comments)    Made him very jittery, also    Family History  Problem Relation Age of Onset   Dementia Father      Prior to Admission medications   Medication Sig Start Date End Date Taking? Authorizing Provider  acetaminophen (TYLENOL) 500 MG tablet Take 1,000 mg by mouth every 6 (six) hours as needed for moderate pain.    [provider]  albuterol (PROVENTIL) (2.5 MG/3ML) 0.083% nebulizer solution Inhale 3 mLs (2.5 mg total) by nebulization every 4 (four) hours as needed for wheezing or shortness of breath. 08/04/20 08/04/21  Lavina Hamman, MD  albuterol (VENTOLIN HFA) 108 (90 Base) MCG/ACT inhaler Inhale 2 puffs into the lungs every 6 (six) hours as needed for wheezing. 06/02/20   [provider]  aspirin 81 MG EC tablet Take 81 mg by mouth daily.    [provider]  b complex vitamins capsule Take 1 capsule by mouth daily.    [provider]  bisacodyl (DULCOLAX) 5 MG EC tablet Take 1 tablet (5 mg total) by mouth daily at 12 noon. Patient taking differently: Take 5 mg by mouth daily. 08/04/20   Lavina Hamman, MD  buPROPion (WELLBUTRIN XL) 300 MG 24 hr tablet Take 300 mg by mouth daily. 02/05/20   [provider]  busPIRone (BUSPAR) 15 MG tablet Take 15 mg by mouth daily.    [provider]  calcium-vitamin D (OSCAL WITH D) 500-200 MG-UNIT tablet Take 1 tablet by mouth 2 (two) times daily with a meal.    [provider]  Cholecalciferol (VITAMIN D-3) 25 MCG (1000 UT) CAPS Take 1,000 Units by mouth daily.    [provider]  cyclobenzaprine (FLEXERIL) 10 MG tablet Take 20 mg by mouth at bedtime.    [provider]  diltiazem (CARDIZEM CD) 180 MG 24 hr capsule Take 180 mg by mouth daily.    [provider]  docusate sodium (COLACE) 100 MG capsule Take 2 capsules (200 mg total) by mouth 2 (two) times daily. Patient taking differently: Take 200 mg by  mouth daily. 08/04/20   Lavina Hamman, MD  DULoxetine (CYMBALTA) 60 MG capsule Take 1 capsule (60 mg total) by mouth 2 (two) times daily. 07/21/17   Lavina Hamman, MD  feeding supplement (ENSURE ENLIVE / ENSURE PLUS) LIQD Take 237 mLs by mouth 3 (three) times daily between meals. Patient taking differently: Take 237 mLs by mouth daily. 08/04/20   Lavina Hamman, MD  fluticasone Asencion Islam) 50 MCG/ACT nasal spray Place 2 sprays into both nostrils daily.     [provider]  folic acid (FOLVITE) 1 MG tablet Take 1 mg by mouth daily.    [provider]  gabapentin (NEURONTIN) 300 MG capsule Take 900 mg by mouth at bedtime.    [provider]  Garlic Oil 3 MG CAPS Take 3 mg by mouth daily.    [provider]  lisinopril (ZESTRIL) 20 MG tablet Take 20 mg by mouth daily.    [provider]  lurasidone (  LATUDA) 40 MG TABS tablet Take 40 mg by mouth daily after supper.    [provider]  Melatonin 3 MG TABS Take 6 mg by mouth at bedtime.    [provider]  metFORMIN (GLUCOPHAGE) 500 MG tablet Take 500 mg by mouth 2 (two) times daily with a meal.    [provider]  methotrexate (RHEUMATREX) 2.5 MG tablet Take 15 mg by mouth once a week. On Saturday    [provider]  Multiple Vitamin (MULTIVITAMIN WITH MINERALS) TABS tablet Take 1 tablet by mouth daily.    [provider]  mycophenolate (CELLCEPT) 250 MG capsule Take 1,500 mg by mouth 2 (two) times daily.    [provider]  nitroGLYCERIN (NITROSTAT) 0.4 MG SL tablet Place 1 tablet (0.4 mg total) under the tongue every 5 (five) minutes as needed for chest pain. 07/03/19   Danford, Suann Larry, MD  Olodaterol HCl (STRIVERDI RESPIMAT) 2.5 MCG/ACT AERS Inhale 2 puffs into the lungs daily.    [provider]  OVER THE COUNTER MEDICATION Place 1 drop into both ears daily as needed (itching). Miracell ProEar for irritated itchy ears    [provider]  OVER THE COUNTER MEDICATION Take 2 tablets by mouth at bedtime. Pure Zzzz    [provider]  oxcarbazepine (TRILEPTAL) 600 MG tablet Take 600-900 mg by mouth See admin instructions. Take 600mg  by mouth in the morning then 900mg  at bedtime 07/16/20   [provider]  oxyCODONE-acetaminophen (PERCOCET/ROXICET) 5-325 MG tablet Take 1-2 tablets by mouth every 6 (six) hours as needed for severe pain. 01/20/21   Lacretia Leigh, MD  polyethylene glycol (MIRALAX / GLYCOLAX) 17 g packet Take 17 g by mouth daily.    [provider]  polyvinyl alcohol (LIQUIFILM TEARS) 1.4 % ophthalmic solution Place 1 drop into both eyes 4 (four) times daily as needed for dry eyes.    [provider]  PRESCRIPTION MEDICATION Inhale into the lungs See admin instructions. CPAP- At bedtime and during during any naps    [provider]  prochlorperazine (COMPAZINE) 10 MG tablet Take 1 tablet (10 mg total) by mouth every 6 (six) hours as needed for nausea or vomiting. 28/78/67   Delora Fuel, MD  Propylene Glycol (SYSTANE BALANCE) 0.6 % SOLN Place 1 drop into both eyes in the morning, at noon, and at bedtime.     [provider]  sodium chloride (OCEAN) 0.65 % SOLN nasal spray Place 2 sprays into both nostrils 4 (four) times daily as needed for congestion.    [provider]  tamsulosin (FLOMAX) 0.4 MG CAPS capsule Take 0.4 mg by mouth daily.    [provider]  traMADol (ULTRAM) 50 MG tablet Take 100 mg by mouth at bedtime.    [provider]  traZODone (DESYREL) 50 MG tablet Take 75 mg by mouth at bedtime.    [provider]  VITAMIN A PO Take 1 tablet by mouth daily.    [provider]  vitamin C (ASCORBIC ACID) 500 MG tablet Take 500 mg by mouth 2 (two) times daily.     [provider]    Physical Exam: Vitals:   02/03/21 1718 02/03/21 1726  BP:  (!) 140/102  Pulse:  (!) 111  Resp:  (!) 21  Temp:   98.9 F (37.2 C)  TempSrc:  Oral  SpO2: 90% 94%   Constitutional: Obese man sitting up in bed, NAD, calm, comfortable Eyes: PERRL, lids  and conjunctivae normal ENMT: Mucous membranes are moist. Posterior pharynx clear of any exudate or lesions.Normal dentition.  Neck: normal, supple, no masses. Respiratory: Inspiratory crackles right lung base otherwise clear to auscultation. Normal respiratory effort while on 3 L O2 via [place]. No accessory muscle use.  Cardiovascular: Regular rate and rhythm, no murmurs / rubs / gallops. No extremity edema. 2+ pedal pulses. Abdomen: no tenderness, no masses palpated. No hepatosplenomegaly. Bowel sounds positive.  Musculoskeletal: no clubbing / cyanosis. No joint deformity upper and lower extremities. Good ROM, no contractures. Normal muscle tone.  Skin: no rashes, lesions, ulcers. No induration Neurologic: CN 2-12 grossly intact. Sensation intact. Strength 5/5 in all 4.  Psychiatric: Normal judgment and insight. Alert and oriented x 3. Normal mood.   Labs on Admission: I have personally reviewed following labs and imaging studies  CBC: Recent Labs  Lab 02/03/21 1803  WBC 10.9*  HGB 15.6  HCT 47.0  MCV 98.3  PLT 314   Basic Metabolic Panel: Recent Labs  Lab 02/03/21 1803  NA 135  K 4.5  CL 101  CO2 25  GLUCOSE 138*  BUN 18  CREATININE 0.98  CALCIUM 9.3   GFR: CrCl cannot be calculated (Unknown ideal weight.). Liver Function Tests: No results for input(s): AST, ALT, ALKPHOS, BILITOT, PROT, ALBUMIN in the last 168 hours. No results for input(s): LIPASE, AMYLASE in the last 168 hours. No results for input(s): AMMONIA in the last 168 hours. Coagulation Profile: No results for input(s): INR, PROTIME in the last 168 hours. Cardiac Enzymes: No results for input(s): CKTOTAL, CKMB, CKMBINDEX, TROPONINI in the last 168 hours. BNP (last 3 results) No results for input(s): PROBNP in the last 8760 hours. HbA1C: No results for input(s): HGBA1C  in the last 72 hours. CBG: No results for input(s): GLUCAP in the last 168 hours. Lipid Profile: No results for input(s): CHOL, HDL, LDLCALC, TRIG, CHOLHDL, LDLDIRECT in the last 72 hours. Thyroid Function Tests: No results for input(s): TSH, T4TOTAL, FREET4, T3FREE, THYROIDAB in the last 72 hours. Anemia Panel: No results for input(s): VITAMINB12, FOLATE, FERRITIN, TIBC, IRON, RETICCTPCT in the last 72 hours. Urine analysis:    Component Value Date/Time   COLORURINE YELLOW 01/08/2021 2320   APPEARANCEUR CLEAR 01/08/2021 2320   LABSPEC 1.019 01/08/2021 2320   PHURINE 6.0 01/08/2021 2320   GLUCOSEU NEGATIVE 01/08/2021 2320   HGBUR NEGATIVE 01/08/2021 2320   BILIRUBINUR NEGATIVE 01/08/2021 2320   KETONESUR NEGATIVE 01/08/2021 2320   PROTEINUR NEGATIVE 01/08/2021 2320   UROBILINOGEN 1.0 01/12/2013 0024   NITRITE NEGATIVE 01/08/2021 2320   LEUKOCYTESUR NEGATIVE 01/08/2021 2320    Radiological Exams on Admission: CT Angio Chest PE W and/or Wo Contrast  Addendum Date: 02/03/2021   ADDENDUM REPORT: 02/03/2021 21:10 ADDENDUM: These results were called by telephone at the time of interpretation on 02/03/2021 at 9:07 pm to provider [facility] , who verbally acknowledged these results. Electronically Signed   By: Fidela Salisbury M.D.   On: 02/03/2021 21:10   Result Date: 02/03/2021 CLINICAL DATA:  Pulmonary embolism (PE) suspected, high prob EXAM: CT ANGIOGRAPHY CHEST WITH CONTRAST TECHNIQUE: Multidetector CT imaging of the chest was performed using the standard protocol during bolus administration of intravenous contrast. Multiplanar CT image reconstructions and MIPs were obtained to evaluate the vascular anatomy. CONTRAST:  153mL OMNIPAQUE IOHEXOL 350 MG/ML SOLN COMPARISON:  01/18/2021 FINDINGS: Cardiovascular: The examination is limited by respiratory motion artifact, particularly involving the segmental pulmonary arteries of the lung bases bilaterally. Despite thisq, there is  a tiny  branching intraluminal filling defect identified within the segmental pulmonary arteries of the right lower lobe in keeping with an acute pulmonary embolus. The embolic burden is tiny there is no CT evidence of right heart strain. The central pulmonary arteries are enlarged in keeping with changes of pulmonary arterial hypertension. Moderate coronary artery calcification. Global cardiac size within normal limits. No pericardial effusion. The thoracic aorta is of normal caliber. Mild mixed atherosclerotic plaque within the descending thoracic aorta. Mediastinum/Nodes: No enlarged mediastinal, hilar, or axillary lymph nodes. Thyroid gland, trachea, and esophagus demonstrate no significant findings. Lungs/Pleura: Severe emphysema. Multiple bulla are again identified within the lung bases bilaterally. Subpleural solid mass within the left lower lobe is not well visualized on this examination due to respiratory motion artifact and phase of contrast administration, but appears grossly unchanged from prior examination at axial image # 109/10. Surrounding ground-glass opacity may reflect the sequela of radiation therapy in the appropriate clinical setting. No new focal pulmonary nodules or infiltrates. No pneumothorax. Small left pleural effusion is stable. No central obstructing lesion. Upper Abdomen: No acute abnormality. Simple cyst again identified within the right hepatic lobe. Musculoskeletal: No acute bone abnormality. No lytic or blastic bone lesion. Review of the MIP images confirms the above findings. IMPRESSION: Technically limited examination, but positive for the presence of acute pulmonary embolism. Tiny embolic burden. No CT evidence of right heart strain. Moderate coronary artery calcification. Severe emphysema. Known left lower lobe pulmonary mass is grossly unchanged, though not well assessed on this examination. Aortic Atherosclerosis (ICD10-I70.0) and Emphysema (ICD10-J43.9). Electronically Signed: By:  Fidela Salisbury M.D. On: 02/03/2021 21:05   DG Chest Port 1 View  Result Date: 02/03/2021 CLINICAL DATA:  A 70 year male could plans of shortness of breath, history of lung cancer by report. EXAM: PORTABLE CHEST 1 VIEW COMPARISON:  Comparison is made with January 20, 2021. FINDINGS: Cardiomediastinal contours and hilar structures are stable. Persistent and increased LEFT lower lobe airspace disease about the area of the patient's known LEFT lower lobe mass. Appearance of the chest is quite similar to previous imaging. The most inferior aspect of the chest is excluded from view. No signs of pneumothorax. Minimal RIGHT lower lobe atelectasis. On limited assessment there is no acute skeletal process. IMPRESSION: Increased airspace disease at the LEFT lung base is suspected surrounding the area of mass lesion known to exist in this location. This may represent developing pneumonia. Likely with small LEFT effusion. Minimal RIGHT lower lobe atelectasis. Electronically Signed   By: Zetta Bills M.D.   On: 02/03/2021 18:06    EKG: Personally reviewed. Sinus tachycardia, rate 112, nonspecific IVCD with LAD.  Not significantly changed when compared to prior.  Assessment/Plan Principal Problem:   Acute pulmonary embolism without acute cor pulmonale (HCC) Active Problems:   Hypertension associated with diabetes (HCC)   DM2 (diabetes mellitus, type 2) (HCC)   OSA (obstructive sleep apnea)   COPD (chronic obstructive pulmonary disease) (HCC)   Non-small cell cancer of left lung (HCC)   Mood disorder (HCC)   Mario [name] is a 70 y.o. male with medical history significant for COPD, chronic hypoxic respiratory failure on 3 L O2 via  as needed, non-small cell lung cancer (left lower lobe lung mass) s/p SBRT, T2DM, HTN, HLD, mood disorder, uveitis, ankylosing spondylitis, chronic pain syndrome, OSA on CPAP who is admitted with acute PE.  Acute pulmonary embolism: Small acute PE seen in the right lower  lobe without CT Evidence of  right heart strain.  Started on IV heparin, continue overnight.  Can likely transition to oral anticoagulation soon.   COPD with chronic hypoxic respiratory failure: Currently stable.  Uses 3 L O2 via  chronically with exertion and at night. -Continue Striverdi and albuterol as needed  Non-small cell lung cancer: S/p SBRT September 2022.  Left lower lung mass unchanged on CTA.  Type 2 diabetes: Hold metformin and placed on SSI.  Hypertension: Continue diltiazem.  History of uveitis/ankylosing spondylitis Chronic pain syndrome: Continue CellCept, gabapentin, Cymbalta, Flexeri, tramadol l.  Mood disorder: Continue home BuSpar, bupropion, Abilify, Latuda, Trileptal.  BPH: Continue Flomax.  OSA: Continue CPAP nightly.  DVT prophylaxis: IV heparin Code Status: Full code, confirmed on admission Family Communication: Discussed with patient, he discussed with his close friend Disposition Plan: From home and likely discharge to home pending clinical progress Consults called: None Level of care: Telemetry Admission status:   Status is: Observation  The patient remains OBS appropriate and will d/c before 2 midnights.   Zada Finders MD [facility]  If 7PM-7AM, please contact night-coverage [email or web address]  02/03/2021, 10:11 PM

## 2021-02-03 NOTE — ED Provider Notes (Signed)
Mario [name] DEPT Provider Note   CSN: [id number] Arrival date & time: 02/03/21  1709     History Chief Complaint  Patient presents with  . Shortness of Breath    Mario [name] is a 70 y.o. male.  70 year old male with prior medical history as detailed below presents for evaluation.  Patient with complaint of chest discomfort and dyspnea with exertion.  Patient does have a history of lung cancer.  Patient is not currently on anticoagulation.  Patient reports increased chest discomfort with exertion.  This began today.  The history is provided by the patient.  Shortness of Breath Severity:  Moderate Onset quality:  Sudden Duration:  1 day Timing:  Constant Progression:  Unchanged Chronicity:  New Context: activity   Relieved by:  Nothing     Past Medical History:  Diagnosis Date  . Anxiety   . Bronchitis   . COPD (chronic obstructive pulmonary disease) ([place])   . Depression   . History of radiation therapy    Left lung- 10/05/20-10/15/20- Dr. Gery Pray  . Hypertension   . lung ca 09/2020  . Mental disorder   . MI (myocardial infarction) ([place])    ????  . OSA (obstructive sleep apnea)   . Suicide attempt ([place])   . Tension pneumothorax 06/27/2016  . Uveitis     Patient Active Problem List   Diagnosis Date Noted  . Acute pulmonary embolism without acute cor pulmonale ([place]) 02/03/2021  . Non-small cell cancer of left lung ([place]) 09/21/2020  . Lung nodule 07/30/2020  . Mass of left lung   . Acute respiratory failure with hypoxemia ([place]) 07/26/2020  . Constipation   . CAP (community acquired pneumonia) 04/03/2020  . COPD with acute exacerbation ([place]) 01/05/2020  . Vertigo 12/26/2019  . 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 Stage 3a chronic kidney disease ([place]) 12/26/2019  . Abnormal CT of the chest 12/26/2019  . COPD (chronic obstructive pulmonary disease) ([place])   . AKI (acute kidney injury) ([place])   . Hyperglycemia 06/23/2019  .  OSA (obstructive sleep apnea) 06/23/2019  . Suicidal ideation 06/23/2019  . Major depressive disorder, recurrent episode ([place]) 10/12/2018  . 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 Cellulitis of both feet 07/16/2017  . DM2 (diabetes mellitus, type 2) ([place]) 07/16/2017  . HLA B27 (HLA B27 positive) 07/16/2017  . Acute chest pain   . Hyperkalemia   . Spontaneous pneumothorax 06/27/2016  . Hypoxia   . Pneumothorax on left   . Dyspnea 05/23/2014  . COPD exacerbation ([place]) 05/23/2014  . Malingering 01/21/2013  . Depression 01/11/2013  . Tobacco abuse 01/11/2013  . Obesity, unspecified 01/11/2013  . Chest pain 01/10/2013  . HTN (hypertension) 01/10/2013    Past Surgical History:  Procedure Laterality Date  . BRONCHIAL BIOPSY  07/30/2020   Procedure: BRONCHIAL BIOPSIES;  Surgeon: Garner Nash, DO;  Location: [facility];  Service: Pulmonary;;  . BRONCHIAL BRUSHINGS  07/30/2020   Procedure: BRONCHIAL BRUSHINGS;  Surgeon: Garner Nash, DO;  Location: [facility];  Service: Pulmonary;;  . BRONCHIAL NEEDLE ASPIRATION BIOPSY  07/30/2020   Procedure: BRONCHIAL NEEDLE ASPIRATION BIOPSIES;  Surgeon: Garner Nash, DO;  Location: [facility];  Service: Pulmonary;;  . BRONCHIAL WASHINGS  07/30/2020   Procedure: BRONCHIAL WASHINGS;  Surgeon: Garner Nash, DO;  Location: [facility];  Service: Pulmonary;;  . CHEST TUBE INSERTION Left 06/27/2016  . cryptorchidism    . SKIN CANCER EXCISION    .  VIDEO BRONCHOSCOPY WITH ENDOBRONCHIAL NAVIGATION Left 07/30/2020   Procedure: VIDEO BRONCHOSCOPY WITH ENDOBRONCHIAL NAVIGATION;  Surgeon: Garner Nash, DO;  Location: [facility];  Service: Pulmonary;  Laterality: Left;       Family History  Problem Relation Age of Onset  . Dementia Father     Social History   Tobacco Use  . Smoking status: Former    Packs/day: 1.00    Years: 35.00    Pack years: 35.00    Types: Cigarettes    Quit date:  05/2016    Years since quitting: 4.6  . Smokeless tobacco: Never  Vaping Use  . Vaping Use: Never used  Substance Use Topics  . Alcohol use: No    Alcohol/week: 0.0 standard drinks    Comment: denies use of any drugs or alcohol  . Drug use: No    Home Medications Prior to Admission medications   Medication Sig Start Date End Date Taking? Authorizing Provider  acetaminophen (TYLENOL) 500 MG tablet Take 1,000 mg by mouth every 6 (six) hours as needed for moderate pain.    [provider]  albuterol (PROVENTIL) (2.5 MG/3ML) 0.083% nebulizer solution Inhale 3 mLs (2.5 mg total) by nebulization every 4 (four) hours as needed for wheezing or shortness of breath. 08/04/20 08/04/21  Lavina Hamman, MD  albuterol (VENTOLIN HFA) 108 (90 Base) MCG/ACT inhaler Inhale 2 puffs into the lungs every 6 (six) hours as needed for wheezing. 06/02/20   [provider]  aspirin 81 MG EC tablet Take 81 mg by mouth daily.    [provider]  b complex vitamins capsule Take 1 capsule by mouth daily.    [provider]  bisacodyl (DULCOLAX) 5 MG EC tablet Take 1 tablet (5 mg total) by mouth daily at 12 noon. Patient taking differently: Take 5 mg by mouth daily. 08/04/20   Lavina Hamman, MD  buPROPion (WELLBUTRIN XL) 300 MG 24 hr tablet Take 300 mg by mouth daily. 02/05/20   [provider]  busPIRone (BUSPAR) 15 MG tablet Take 15 mg by mouth daily.    [provider]  calcium-vitamin D (OSCAL WITH D) 500-200 MG-UNIT tablet Take 1 tablet by mouth 2 (two) times daily with a meal.    [provider]  Cholecalciferol (VITAMIN D-3) 25 MCG (1000 UT) CAPS Take 1,000 Units by mouth daily.    [provider]  cyclobenzaprine (FLEXERIL) 10 MG tablet Take 20 mg by mouth at bedtime.    [provider]  diltiazem (CARDIZEM CD) 180 MG 24 hr capsule Take 180 mg by mouth daily.    [provider]  docusate sodium (COLACE) 100 MG capsule Take 2  capsules (200 mg total) by mouth 2 (two) times daily. Patient taking differently: Take 200 mg by mouth daily. 08/04/20   Lavina Hamman, MD  DULoxetine (CYMBALTA) 60 MG capsule Take 1 capsule (60 mg total) by mouth 2 (two) times daily. 07/21/17   Lavina Hamman, MD  feeding supplement (ENSURE ENLIVE / ENSURE PLUS) LIQD Take 237 mLs by mouth 3 (three) times daily between meals. Patient taking differently: Take 237 mLs by mouth daily. 08/04/20   Lavina Hamman, MD  fluticasone Asencion Islam) 50 MCG/ACT nasal spray Place 2 sprays into both nostrils daily.     [provider]  folic acid (FOLVITE) 1 MG tablet Take 1 mg by mouth daily.    [provider]  gabapentin (NEURONTIN) 300 MG capsule Take 900 mg by mouth  at bedtime.    [provider]  Garlic Oil 3 MG CAPS Take 3 mg by mouth daily.    [provider]  lisinopril (ZESTRIL) 20 MG tablet Take 20 mg by mouth daily.    [provider]  lurasidone (LATUDA) 40 MG TABS tablet Take 40 mg by mouth daily after supper.    [provider]  Melatonin 3 MG TABS Take 6 mg by mouth at bedtime.    [provider]  metFORMIN (GLUCOPHAGE) 500 MG tablet Take 500 mg by mouth 2 (two) times daily with a meal.    [provider]  methotrexate (RHEUMATREX) 2.5 MG tablet Take 15 mg by mouth once a week. On Saturday    [provider]  Multiple Vitamin (MULTIVITAMIN WITH MINERALS) TABS tablet Take 1 tablet by mouth daily.    [provider]  mycophenolate (CELLCEPT) 250 MG capsule Take 1,500 mg by mouth 2 (two) times daily.    [provider]  nitroGLYCERIN (NITROSTAT) 0.4 MG SL tablet Place 1 tablet (0.4 mg total) under the tongue every 5 (five) minutes as needed for chest pain. 07/03/19   Danford, Suann Larry, MD  Olodaterol HCl (STRIVERDI RESPIMAT) 2.5 MCG/ACT AERS Inhale 2 puffs into the lungs daily.    [provider]  OVER THE COUNTER MEDICATION Place 1 drop into  both ears daily as needed (itching). Miracell ProEar for irritated itchy ears    [provider]  OVER THE COUNTER MEDICATION Take 2 tablets by mouth at bedtime. Pure Zzzz    [provider]  oxcarbazepine (TRILEPTAL) 600 MG tablet Take 600-900 mg by mouth See admin instructions. Take 656m by mouth in the morning then [id number] bedtime 07/16/20   [provider]  oxyCODONE-acetaminophen (PERCOCET/ROXICET) 5-325 MG tablet Take 1-2 tablets by mouth every 6 (six) hours as needed for severe pain. 01/20/21   AlLacretia LeighMD  polyethylene glycol (MIRALAX / GLYCOLAX) 17 g packet Take 17 g by mouth daily.    [provider]  polyvinyl alcohol (LIQUIFILM TEARS) 1.4 % ophthalmic solution Place 1 drop into both eyes 4 (four) times daily as needed for dry eyes.    [provider]  PRESCRIPTION MEDICATION Inhale into the lungs See admin instructions. CPAP- At bedtime and during during any naps    [provider]  prochlorperazine (COMPAZINE) 10 MG tablet Take 1 tablet (10 mg total) by mouth every 6 (six) hours as needed for nausea or vomiting. 1197/35/32 GlDelora FuelMD  Propylene Glycol (SYSTANE BALANCE) 0.6 % SOLN Place 1 drop into both eyes in the morning, at noon, and at bedtime.     [provider]  sodium chloride (OCEAN) 0.65 % SOLN nasal spray Place 2 sprays into both nostrils 4 (four) times daily as needed for congestion.    [provider]  tamsulosin (FLOMAX) 0.4 MG CAPS capsule Take 0.4 mg by mouth daily.    [provider]  traMADol (ULTRAM) 50 MG tablet Take 100 mg by mouth at bedtime.    [provider]  traZODone (DESYREL) 50 MG tablet Take 75 mg by mouth at bedtime.    [provider]  VITAMIN A PO Take 1 tablet by mouth daily.    [provider]  vitamin C (ASCORBIC ACID) 500 MG tablet Take 500 mg by mouth 2 (two) times daily.     [provider]    Allergies    Demerol  [meperidine] and Zocor [  simvastatin]  Review of Systems   Review of Systems  Respiratory:  Positive for shortness of breath.   All other systems reviewed and are negative.  Physical Exam Updated Vital Signs BP (!) 140/102 (BP Location: Left Arm)   Pulse (!) 111   Temp 98.9 F (37.2 C) (Oral)   Resp (!) 21   SpO2 94%   Physical Exam Vitals and nursing note reviewed.  Constitutional:      General: He is not in acute distress.    Appearance: Normal appearance. He is well-developed.  HENT:     Head: Normocephalic and atraumatic.  Eyes:     Conjunctiva/sclera: Conjunctivae normal.     Pupils: Pupils are equal, round, and reactive to light.  Cardiovascular:     Rate and Rhythm: Normal rate and regular rhythm.     Heart sounds: Normal heart sounds.  Pulmonary:     Effort: Pulmonary effort is normal. No respiratory distress.     Breath sounds: Normal breath sounds.  Abdominal:     General: There is no distension.     Palpations: Abdomen is soft.     Tenderness: There is no abdominal tenderness.  Musculoskeletal:        General: No deformity. Normal range of motion.     Cervical back: Normal range of motion and neck supple.  Skin:    General: Skin is warm and dry.  Neurological:     General: No focal deficit present.     Mental Status: He is alert and oriented to person, place, and time.    ED Results / Procedures / Treatments   Labs (all labs ordered are listed, but only abnormal results are displayed) Labs Reviewed  BASIC METABOLIC PANEL - Abnormal; Notable for the following components:      Result Value   Glucose, Bld 138 (*)    All other components within normal limits  CBC - Abnormal; Notable for the following components:   WBC 10.9 (*)    All other components within normal limits  RESP PANEL BY RT-PCR (FLU A&B, COVID) ARPGX2  APTT  PROTIME-INR  CBC  TROPONIN I (HIGH SENSITIVITY)  TROPONIN I (HIGH SENSITIVITY)    EKG EKG  Interpretation  Date/Time:  Wednesday February 03 2021 17:35:34 EST Ventricular Rate:  112 PR Interval:  181 QRS Duration: 120 QT Interval:  344 QTC Calculation: 470 R Axis:   -54 Text Interpretation: Sinus tachycardia Nonspecific IVCD with LAD Confirmed by Dene Gentry [phone]) on 02/03/2021 9:22:54 PM  Radiology CT Angio Chest PE W and/or Wo Contrast  Addendum Date: 02/03/2021   ADDENDUM REPORT: 02/03/2021 21:10 ADDENDUM: These results were called by telephone at the time of interpretation on 02/03/2021 at 9:07 pm to provider Coral Ceo , who verbally acknowledged these results. Electronically Signed   By: Fidela Salisbury M.D.   On: 02/03/2021 21:10   Result Date: 02/03/2021 CLINICAL DATA:  Pulmonary embolism (PE) suspected, high prob EXAM: CT ANGIOGRAPHY CHEST WITH CONTRAST TECHNIQUE: Multidetector CT imaging of the chest was performed using the standard protocol during bolus administration of intravenous contrast. Multiplanar CT image reconstructions and MIPs were obtained to evaluate the vascular anatomy. CONTRAST:  149m OMNIPAQUE IOHEXOL 350 MG/ML SOLN COMPARISON:  01/18/2021 FINDINGS: Cardiovascular: The examination is limited by respiratory motion artifact, particularly involving the segmental pulmonary arteries of the lung bases bilaterally. Despite thisq, there is a tiny branching intraluminal filling defect identified within the segmental pulmonary arteries of the right lower lobe in keeping with an  acute pulmonary embolus. The embolic burden is tiny there is no CT evidence of right heart strain. The central pulmonary arteries are enlarged in keeping with changes of pulmonary arterial hypertension. Moderate coronary artery calcification. Global cardiac size within normal limits. No pericardial effusion. The thoracic aorta is of normal caliber. Mild mixed atherosclerotic plaque within the descending thoracic aorta. Mediastinum/Nodes: No enlarged mediastinal, hilar, or axillary lymph  nodes. Thyroid gland, trachea, and esophagus demonstrate no significant findings. Lungs/Pleura: Severe emphysema. Multiple bulla are again identified within the lung bases bilaterally. Subpleural solid mass within the left lower lobe is not well visualized on this examination due to respiratory motion artifact and phase of contrast administration, but appears grossly unchanged from prior examination at axial image # 109/10. Surrounding ground-glass opacity may reflect the sequela of radiation therapy in the appropriate clinical setting. No new focal pulmonary nodules or infiltrates. No pneumothorax. Small left pleural effusion is stable. No central obstructing lesion. Upper Abdomen: No acute abnormality. Simple cyst again identified within the right hepatic lobe. Musculoskeletal: No acute bone abnormality. No lytic or blastic bone lesion. Review of the MIP images confirms the above findings. IMPRESSION: Technically limited examination, but positive for the presence of acute pulmonary embolism. Tiny embolic burden. No CT evidence of right heart strain. Moderate coronary artery calcification. Severe emphysema. Known left lower lobe pulmonary mass is grossly unchanged, though not well assessed on this examination. Aortic Atherosclerosis (ICD10-I70.0) and Emphysema (ICD10-J43.9). Electronically Signed: By: Fidela Salisbury M.D. On: 02/03/2021 21:05   DG Chest Port 1 View  Result Date: 02/03/2021 CLINICAL DATA:  A 70 year male could plans of shortness of breath, history of lung cancer by report. EXAM: PORTABLE CHEST 1 VIEW COMPARISON:  Comparison is made with January 20, 2021. FINDINGS: Cardiomediastinal contours and hilar structures are stable. Persistent and increased LEFT lower lobe airspace disease about the area of the patient's known LEFT lower lobe mass. Appearance of the chest is quite similar to previous imaging. The most inferior aspect of the chest is excluded from view. No signs of pneumothorax. Minimal  RIGHT lower lobe atelectasis. On limited assessment there is no acute skeletal process. IMPRESSION: Increased airspace disease at the LEFT lung base is suspected surrounding the area of mass lesion known to exist in this location. This may represent developing pneumonia. Likely with small LEFT effusion. Minimal RIGHT lower lobe atelectasis. Electronically Signed   By: Zetta Bills M.D.   On: 02/03/2021 18:06    Procedures Procedures   Medications Ordered in ED Medications  heparin ADULT infusion 100 units/mL (25000 units/229m) (1,800 Units/hr Intravenous New Bag/Given 02/03/21 2208)  ondansetron (ZOFRAN-ODT) disintegrating tablet 4 mg (4 mg Oral Given 02/03/21 2031)  iohexol (OMNIPAQUE) 350 MG/ML injection 100 mL (100 mLs Intravenous Contrast Given 02/03/21 2046)  heparin bolus via infusion 4,000 Units (4,000 Units Intravenous Bolus from Bag 02/03/21 2208)    ED Course  I have reviewed the triage vital signs and the nursing notes.  Pertinent labs & imaging results that were available during my care of the patient were reviewed by me and considered in my medical decision making (see chart for details).    MDM Rules/Calculators/A&P                           MDM  MSE complete  Mario COLEMANwas evaluated in Emergency Department on 02/03/2021 for the symptoms described in the history of present illness. He was evaluated in the context  of the global COVID-19 pandemic, which necessitated consideration that the patient might be at risk for infection with the SARS-CoV-2 virus that causes COVID-19. Institutional protocols and algorithms that pertain to the evaluation of patients at risk for COVID-19 are in a state of rapid change based on information released by regulatory bodies including the CDC and federal and state organizations. These policies and algorithms were followed during the patient's care in the ED.  Patient is presenting with complaint of chest discomfort and exertional shortness  of breath.  Patient's presentation is concerning for possibility of PE.  CT imaging does reveal evidence of PE with minimal clot burden.  No right heart strain on CT.  Patient started on heparin.  Patient would likely benefit from overnight observation and treatment.  Hospitalist services aware of case and will evaluate for same.    Final Clinical Impression(s) / ED Diagnoses Final diagnoses:  Dyspnea, unspecified type  Other acute pulmonary embolism, unspecified whether acute cor pulmonale present [facility])    Rx / DC Orders ED Discharge Orders     None        Valarie Merino, MD 02/03/21 2305

## 2021-02-03 NOTE — ED Provider Notes (Signed)
Emergency Medicine Provider Triage Evaluation Note  Mario [name] , a 70 y.o. male  was evaluated in triage.  Pt complains of cp, sob. Hx lung CA. Pain is pleuritic in nature  Review of Systems  Positive: Chest pain, sob Negative: fever  Physical Exam  BP (!) 140/102 (BP Location: Left Arm)   Pulse (!) 111   Temp 98.9 F (37.2 C) (Oral)   Resp (!) 21   SpO2 94%  Gen:   Awake, no distress   Resp:  Normal effort  MSK:   Moves extremities without difficulty  Other:  Decreased lung sounds throughout  Medical Decision Making  Medically screening exam initiated at 5:35 PM.  Appropriate orders placed.  Mario [name] was informed that the remainder of the evaluation will be completed by another provider, this initial triage assessment does not replace that evaluation, and the importance of remaining in the ED until their evaluation is complete.     Rodney Booze, PA-C 02/03/21 1736    Sherwood Gambler, MD 02/04/21 [phone]

## 2021-02-03 NOTE — Progress Notes (Signed)
ANTICOAGULATION CONSULT NOTE - Initial Consult  Pharmacy Consult for Heparin Indication: pulmonary embolus  Allergies  Allergen Reactions   Demerol [Meperidine] Nausea And Vomiting and Other (See Comments)    Made the patient "violently sick"   Zocor [Simvastatin] Nausea And Vomiting and Other (See Comments)    Made him very jittery, also    Patient Measurements:   Heparin Dosing Weight: Using 102 kg weight in November was 149 kg, ht 74 in  Vital Signs: Temp: 98.9 F (37.2 C) (12/07 1726) Temp Source: Oral (12/07 1726) BP: 140/102 (12/07 1726) Pulse Rate: 111 (12/07 1726)  Labs: Recent Labs    02/03/21 1803  HGB 15.6  HCT 47.0  PLT 317  CREATININE 0.98  TROPONINIHS 5    CrCl cannot be calculated (Unknown ideal weight.).   Medical History: Past Medical History:  Diagnosis Date   Anxiety    Bronchitis    COPD (chronic obstructive pulmonary disease) ([place])    Depression    History of radiation therapy    Left lung- 10/05/20-10/15/20- Dr. Gery Pray   Hypertension    lung ca 09/2020   Mental disorder    MI (myocardial infarction) ([place])    ????   OSA (obstructive sleep apnea)    Suicide attempt [facility])    Tension pneumothorax 06/27/2016   Uveitis     Assessment: 70 y/o M with a h/o lung cancer admitted with new PE.     Goal of Therapy:  Heparin level 0.3-0.7 units/ml Monitor platelets by anticoagulation protocol: Yes   Plan:  Give 4000 units bolus x 1 Start heparin infusion at 1800 units/hr Check anti-Xa level in 8 hours and daily while on heparin Continue to monitor H&H and platelets  Ulice Dash D 02/03/2021,9:30 PM

## 2021-02-03 NOTE — ED Triage Notes (Signed)
Pt bib [facility] from home d/t shob and chest pain w/ inspiration.  Per EMS pt O2 sats drop into the high 80's w/ exertion,4L O2 applied by EMS.

## 2021-02-03 NOTE — ED Provider Notes (Signed)
70 year old male presented the emergency department with chest pain and shortness of breath, history of lung cancer.  Tachycardic on arrival.  I received a call from the radiologist in regards to positive pulmonary embolism on CT PE study.  I have made charge and nursing staff aware, requested that he gets the next available room.   Lorelle Gibbs, DO 02/03/21 2114

## 2021-02-04 ENCOUNTER — Other Ambulatory Visit (HOSPITAL_COMMUNITY): Payer: Self-pay

## 2021-02-04 DIAGNOSIS — J431 Panlobular emphysema: Secondary | ICD-10-CM

## 2021-02-04 DIAGNOSIS — I2699 Other pulmonary embolism without acute cor pulmonale: Secondary | ICD-10-CM | POA: Diagnosis not present

## 2021-02-04 DIAGNOSIS — G4733 Obstructive sleep apnea (adult) (pediatric): Secondary | ICD-10-CM

## 2021-02-04 DIAGNOSIS — E1165 Type 2 diabetes mellitus with hyperglycemia: Secondary | ICD-10-CM

## 2021-02-04 DIAGNOSIS — E1159 Type 2 diabetes mellitus with other circulatory complications: Secondary | ICD-10-CM

## 2021-02-04 DIAGNOSIS — F39 Unspecified mood [affective] disorder: Secondary | ICD-10-CM

## 2021-02-04 DIAGNOSIS — C3492 Malignant neoplasm of unspecified part of left bronchus or lung: Secondary | ICD-10-CM

## 2021-02-04 DIAGNOSIS — I152 Hypertension secondary to endocrine disorders: Secondary | ICD-10-CM

## 2021-02-04 LAB — CBC
HCT: 45.5 % (ref 39.0–52.0)
Hemoglobin: 15.1 g/dL (ref 13.0–17.0)
MCH: 32.8 pg (ref 26.0–34.0)
MCHC: 33.2 g/dL (ref 30.0–36.0)
MCV: 98.9 fL (ref 80.0–100.0)
Platelets: 301 10*3/uL (ref 150–400)
RBC: 4.6 MIL/uL (ref 4.22–5.81)
RDW: 13.6 % (ref 11.5–15.5)
WBC: 10.6 10*3/uL — ABNORMAL HIGH (ref 4.0–10.5)
nRBC: 0 % (ref 0.0–0.2)

## 2021-02-04 LAB — BASIC METABOLIC PANEL
Anion gap: 10 (ref 5–15)
BUN: 18 mg/dL (ref 8–23)
CO2: 24 mmol/L (ref 22–32)
Calcium: 9.2 mg/dL (ref 8.9–10.3)
Chloride: 99 mmol/L (ref 98–111)
Creatinine, Ser: 1.09 mg/dL (ref 0.61–1.24)
GFR, Estimated: >60 mL/min (ref >60)
Glucose, Bld: 185 mg/dL — ABNORMAL HIGH (ref 70–99)
Potassium: 4.3 mmol/L (ref 3.5–5.1)
Sodium: 133 mmol/L — ABNORMAL LOW (ref 135–145)

## 2021-02-04 LAB — HEPARIN LEVEL (UNFRACTIONATED): Heparin Unfractionated: 0.43 IU/mL (ref 0.30–0.70)

## 2021-02-04 LAB — CBG MONITORING, ED: Glucose-Capillary: 267 mg/dL — ABNORMAL HIGH (ref 70–99)

## 2021-02-04 MED ORDER — LISINOPRIL 10 MG PO TABS
20.0000 mg | ORAL_TABLET | Freq: Every day | ORAL | Status: DC
  Administered 2021-02-04: 20 mg via ORAL
  Filled 2021-02-04: qty 2

## 2021-02-04 MED ORDER — BUPROPION HCL ER (XL) 150 MG PO TB24
300.0000 mg | ORAL_TABLET | Freq: Every day | ORAL | Status: DC
  Administered 2021-02-04: 300 mg via ORAL
  Filled 2021-02-04: qty 2

## 2021-02-04 MED ORDER — ADULT MULTIVITAMIN W/MINERALS CH
1.0000 | ORAL_TABLET | Freq: Every day | ORAL | Status: DC
  Administered 2021-02-04: 1 via ORAL
  Filled 2021-02-04: qty 1

## 2021-02-04 MED ORDER — METHYLPREDNISOLONE SODIUM SUCC 125 MG IJ SOLR
60.0000 mg | Freq: Once | INTRAMUSCULAR | Status: AC
  Administered 2021-02-04: 60 mg via INTRAVENOUS
  Filled 2021-02-04: qty 2

## 2021-02-04 MED ORDER — SENNOSIDES-DOCUSATE SODIUM 8.6-50 MG PO TABS: 1.0000 | ORAL_TABLET | Freq: Every evening | ORAL | Status: DC | PRN

## 2021-02-04 MED ORDER — ACETAMINOPHEN 325 MG PO TABS: 650.0000 mg | ORAL_TABLET | Freq: Four times a day (QID) | ORAL | Status: DC | PRN

## 2021-02-04 MED ORDER — INSULIN ASPART 100 UNIT/ML IJ SOLN
0.0000 [IU] | Freq: Three times a day (TID) | INTRAMUSCULAR | Status: DC
Start: 2021-02-04 — End: 2021-02-04
  Administered 2021-02-04: 2 [IU] via SUBCUTANEOUS
  Administered 2021-02-04: 5 [IU] via SUBCUTANEOUS
  Filled 2021-02-04: qty 0.09

## 2021-02-04 MED ORDER — MORPHINE SULFATE (PF) 2 MG/ML IV SOLN
1.0000 mg | Freq: Once | INTRAVENOUS | Status: AC
  Administered 2021-02-04: 1 mg via INTRAVENOUS
  Filled 2021-02-04: qty 1

## 2021-02-04 MED ORDER — LURASIDONE HCL 40 MG PO TABS
40.0000 mg | ORAL_TABLET | Freq: Every day | ORAL | Status: DC
  Filled 2021-02-04: qty 1

## 2021-02-04 MED ORDER — ENSURE ENLIVE PO LIQD
237.0000 mL | Freq: Every day | ORAL | Status: DC
  Filled 2021-02-04: qty 237

## 2021-02-04 MED ORDER — APIXABAN 5 MG PO TABS: 5.0000 mg | ORAL_TABLET | Freq: Two times a day (BID) | ORAL | Status: DC

## 2021-02-04 MED ORDER — MYCOPHENOLATE MOFETIL 250 MG PO CAPS
1500.0000 mg | ORAL_CAPSULE | Freq: Two times a day (BID) | ORAL | Status: DC
  Administered 2021-02-04 (×2): 1500 mg via ORAL
  Filled 2021-02-04 (×3): qty 6

## 2021-02-04 MED ORDER — OXCARBAZEPINE 300 MG PO TABS
600.0000 mg | ORAL_TABLET | Freq: Two times a day (BID) | ORAL | Status: DC
  Administered 2021-02-04: 600 mg via ORAL
  Filled 2021-02-04: qty 2

## 2021-02-04 MED ORDER — APIXABAN 5 MG PO TABS
10.0000 mg | ORAL_TABLET | Freq: Two times a day (BID) | ORAL | Status: DC
  Administered 2021-02-04: 10 mg via ORAL
  Filled 2021-02-04 (×2): qty 2

## 2021-02-04 MED ORDER — ACETAMINOPHEN 650 MG RE SUPP: 650.0000 mg | Freq: Four times a day (QID) | RECTAL | Status: DC | PRN

## 2021-02-04 MED ORDER — ONDANSETRON HCL 4 MG/2ML IJ SOLN
4.0000 mg | Freq: Four times a day (QID) | INTRAMUSCULAR | Status: DC | PRN
  Administered 2021-02-04: 4 mg via INTRAVENOUS
  Filled 2021-02-04: qty 2

## 2021-02-04 MED ORDER — MELATONIN 3 MG PO TABS
6.0000 mg | ORAL_TABLET | Freq: Every day | ORAL | Status: DC
  Administered 2021-02-04: 6 mg via ORAL
  Filled 2021-02-04: qty 2

## 2021-02-04 MED ORDER — OXYCODONE HCL 5 MG PO TABS
10.0000 mg | ORAL_TABLET | ORAL | Status: AC | PRN
  Administered 2021-02-04 (×2): 10 mg via ORAL
  Filled 2021-02-04 (×2): qty 2

## 2021-02-04 MED ORDER — FOLIC ACID 1 MG PO TABS
1.0000 mg | ORAL_TABLET | Freq: Every day | ORAL | Status: DC
  Administered 2021-02-04: 1 mg via ORAL
  Filled 2021-02-04: qty 1

## 2021-02-04 MED ORDER — ARFORMOTEROL TARTRATE 15 MCG/2ML IN NEBU
15.0000 ug | INHALATION_SOLUTION | Freq: Two times a day (BID) | RESPIRATORY_TRACT | Status: DC
  Filled 2021-02-04 (×2): qty 2

## 2021-02-04 MED ORDER — GABAPENTIN 300 MG PO CAPS
900.0000 mg | ORAL_CAPSULE | Freq: Every day | ORAL | Status: DC
  Administered 2021-02-04: 900 mg via ORAL
  Filled 2021-02-04: qty 3

## 2021-02-04 MED ORDER — SODIUM CHLORIDE 0.9% FLUSH
3.0000 mL | Freq: Two times a day (BID) | INTRAVENOUS | Status: DC
  Administered 2021-02-04: 3 mL via INTRAVENOUS

## 2021-02-04 MED ORDER — TAMSULOSIN HCL 0.4 MG PO CAPS
0.4000 mg | ORAL_CAPSULE | Freq: Every day | ORAL | Status: DC
  Administered 2021-02-04: 0.4 mg via ORAL
  Filled 2021-02-04: qty 1

## 2021-02-04 MED ORDER — OXCARBAZEPINE 300 MG PO TABS
900.0000 mg | ORAL_TABLET | Freq: Every day | ORAL | Status: DC
  Administered 2021-02-04: 900 mg via ORAL
  Filled 2021-02-04: qty 3

## 2021-02-04 MED ORDER — ALBUTEROL SULFATE (2.5 MG/3ML) 0.083% IN NEBU: 2.5000 mg | INHALATION_SOLUTION | RESPIRATORY_TRACT | Status: DC | PRN

## 2021-02-04 MED ORDER — TRAZODONE HCL 50 MG PO TABS
75.0000 mg | ORAL_TABLET | Freq: Every day | ORAL | Status: DC
  Administered 2021-02-04: 75 mg via ORAL
  Filled 2021-02-04: qty 1.5

## 2021-02-04 MED ORDER — PREDNISONE 10 MG PO TABS: ORAL_TABLET | ORAL | 0 refills | Status: AC

## 2021-02-04 MED ORDER — BUSPIRONE HCL 10 MG PO TABS
15.0000 mg | ORAL_TABLET | Freq: Every day | ORAL | Status: DC
  Administered 2021-02-04: 15 mg via ORAL
  Filled 2021-02-04: qty 2

## 2021-02-04 MED ORDER — ONDANSETRON HCL 4 MG PO TABS: 4.0000 mg | ORAL_TABLET | Freq: Four times a day (QID) | ORAL | Status: DC | PRN

## 2021-02-04 MED ORDER — DILTIAZEM HCL ER COATED BEADS 180 MG PO CP24
180.0000 mg | ORAL_CAPSULE | Freq: Every day | ORAL | Status: DC
  Administered 2021-02-04: 180 mg via ORAL
  Filled 2021-02-04: qty 1

## 2021-02-04 MED ORDER — DULOXETINE HCL 30 MG PO CPEP
60.0000 mg | ORAL_CAPSULE | Freq: Two times a day (BID) | ORAL | Status: DC
  Administered 2021-02-04 (×2): 60 mg via ORAL
  Filled 2021-02-04 (×2): qty 2

## 2021-02-04 MED ORDER — CYCLOBENZAPRINE HCL 10 MG PO TABS
30.0000 mg | ORAL_TABLET | Freq: Every day | ORAL | Status: DC
  Administered 2021-02-04: 30 mg via ORAL
  Filled 2021-02-04: qty 3

## 2021-02-04 MED ORDER — APIXABAN (ELIQUIS) VTE STARTER PACK (10MG AND 5MG)
ORAL_TABLET | ORAL | 0 refills | Status: DC
  Filled 2021-02-04: qty 74, 28d supply, fill #0

## 2021-02-04 NOTE — Progress Notes (Signed)
ANTICOAGULATION CONSULT NOTE - Initial Consult  Pharmacy Consult for Heparin Indication: pulmonary embolus  Allergies  Allergen Reactions   Demerol [Meperidine] Nausea And Vomiting and Other (See Comments)    Made the patient "violently sick"   Zocor [Simvastatin] Nausea And Vomiting and Other (See Comments)    Made him very jittery, also    Patient Measurements:   Heparin Dosing Weight: Using 102 kg weight in November was 149 kg, ht 74 in  Vital Signs: BP: 122/95 (12/08 0400) Pulse Rate: 99 (12/08 0400)  Labs: Recent Labs    02/03/21 1803 02/03/21 2205 02/04/21 0502  HGB 15.6  --  15.1  HCT 47.0  --  45.5  PLT 317  --  301  APTT  --  29  --   LABPROT  --  12.9  --   INR  --  1.0  --   HEPARINUNFRC  --   --  0.43  CREATININE 0.98  --  1.09  TROPONINIHS 5 5  --      CrCl cannot be calculated (Unknown ideal weight.).   Medical History: Past Medical History:  Diagnosis Date   Anxiety    Bronchitis    COPD (chronic obstructive pulmonary disease) ([place])    Depression    History of radiation therapy    Left lung- 10/05/20-10/15/20- Dr. Gery Pray   Hypertension    lung ca 09/2020   Mental disorder    MI (myocardial infarction) ([place])    ????   OSA (obstructive sleep apnea)    Suicide attempt [facility])    Tension pneumothorax 06/27/2016   Uveitis     Assessment: 70 y/o M with a h/o lung cancer admitted with new PE.   02/04/21 Heparin level = 0.43 (therapeutic) with heparin gtt  @ 1800 units/hr CBC wnl No complications of therapy noted    Goal of Therapy:  Heparin level 0.3-0.7 units/ml Monitor platelets by anticoagulation protocol: Yes   Plan:  Continue heparin gtt @ 1800 units/hr Check heparin level in 6 hr to confirm therapeutic dose Daily heparin level & CBC  Asir Bingley, Toribio Harbour, PharmD 02/04/2021,5:42 AM

## 2021-02-04 NOTE — Discharge Planning (Signed)
Chloeanne Poteet J. Clydene Laming, RN, BSN, [employer] [phone] Pt given brochure with 30 day free card and refill assistance card intact by pharmacy.  Pt utilizes [facility] and [facility] for prescription needs.  NCM called pharmacy to confirm availability of medication.  Information relayed to pt.  Pt verbalizes importance of filling medication upon discharge.

## 2021-02-04 NOTE — Discharge Instructions (Signed)
Information on my medicine - ELIQUIS (apixaban)  This medication education was reviewed with me or my healthcare representative as part of my discharge preparation.  The pharmacist that spoke with me during my hospital stay was:  Minda Ditto, [facility]  Why was Eliquis prescribed for you? Eliquis was prescribed to treat blood clots that may have been found in the veins of your legs (deep vein thrombosis) or in your lungs (pulmonary embolism) and to reduce the risk of them occurring again.  What do You need to know about Eliquis ? The starting dose is 10 mg (two 5 mg tablets) taken TWICE daily for the FIRST SEVEN (7) DAYS, then on (enter date)  02/11/2021  the dose is reduced to ONE 5 mg tablet taken TWICE daily.  Eliquis may be taken with or without food.   Try to take the dose about the same time in the morning and in the evening. If you have difficulty swallowing the tablet whole please discuss with your pharmacist how to take the medication safely.  Take Eliquis exactly as prescribed and DO NOT stop taking Eliquis without talking to the doctor who prescribed the medication.  Stopping may increase your risk of developing a new blood clot.  Refill your prescription before you run out.  After discharge, you should have regular check-up appointments with your healthcare provider that is prescribing your Eliquis.    What do you do if you miss a dose? If a dose of ELIQUIS is not taken at the scheduled time, take it as soon as possible on the same day and twice-daily administration should be resumed. The dose should not be doubled to make up for a missed dose.  Important Safety Information A possible side effect of Eliquis is bleeding. You should call your healthcare provider right away if you experience any of the following: Bleeding from an injury or your nose that does not stop. Unusual colored urine (red or dark brown) or unusual colored stools (red or black). Unusual bruising for  unknown reasons. A serious fall or if you hit your head (even if there is no bleeding).  Some medicines may interact with Eliquis and might increase your risk of bleeding or clotting while on Eliquis. To help avoid this, consult your healthcare provider or pharmacist prior to using any new prescription or non-prescription medications, including herbals, vitamins, non-steroidal anti-inflammatory drugs (NSAIDs) and supplements.  This website has more information on Eliquis (apixaban): http://www.eliquis.com/eliquis/home

## 2021-02-04 NOTE — Progress Notes (Signed)
Brief Pharmacy note:   12/8 counseled on Apixaban Apixaban dospack delivered to pt in ED  Minda Ditto PharmD [facility] [phone] 02/04/2021, 11:15 AM

## 2021-02-04 NOTE — Discharge Summary (Signed)
Physician Discharge Summary  Mario [name] CNO:[id number] DOB: 09-25-1950 DOA: 02/03/2021  PCP: [facility]  Admit date: 02/03/2021 Discharge date: 02/04/2021  Admitted From: Home Disposition: Home  Recommendations for Outpatient Follow-up:  Follow up with PCP in 1-2 weeks Started on Eliquis for right lower lobe segmental DVT; will need medication refill to complete 66-month course  Home Health: No Equipment/Devices: Remains on his home oxygen 3 L per nasal cannula  Discharge Condition: Stable CODE STATUS: Full code Diet recommendation: Heart healthy/consistent carbohydrate diet  History of present illness:  Mario [name] is a 70 y.o. male with medical history significant for COPD, chronic hypoxic respiratory failure on 3 L O2 via [place] as needed, non-small cell lung cancer (left lower lobe lung mass) s/p SBRT, T2DM, HTN, HLD, mood disorder, uveitis, ankylosing spondylitis, chronic pain syndrome, OSA on CPAP who presented to the ED for evaluation of shortness of breath. Patient reports pleuritic chest discomfort at the right lower ribs anteriorly and at his back around 4 PM on 12/7.  He does report chronic back pain from prior injury and ankylosing spondylitis however pleuritic changes are new from his baseline.  He also noticed some worsening exertional dyspnea compared to his baseline.  He has not had any significant chest pain, nausea, vomiting, abdominal pain, dysuria, diarrhea, peripheral edema, or obvious bleeding.   In the ED, BP 140/102, pulse 112, RR 21, temp 98.9 F, SPO2 94% on 4 L supplemental O2 via [place]. WBC 10.9, hemoglobin 15.6, platelets 317,000, sodium 135, potassium 4.5, bicarb 25, BUN 18, creatinine 0.98, serum glucose 138, high-sensitivity troponin 5. SARS-CoV-2 and influenza PCR negative. Portable chest x-ray showed increased airspace disease at the left lung base. CTA chest PE study was limited by respiratory motion but positive for the presence of acute PE  within the segmental pulmonary arteries of the right lower lobe.  No CT evidence of right heart strain seen.  Severe emphysematous changes noted.  Known left lower lobe pulmonary mass is grossly unchanged. Patient was started on IV heparin and the hospitalist service was consulted to admit for further evaluation and management.  Hospital course:  Acute pulmonary embolism: Small acute PE seen in the right lower lobe without CT Evidence of right heart strain.  Started on IV heparin, and transitioned to Eliquis.  Will need Eliquis refills by PCP to complete 81-month course.   COPD with chronic hypoxic respiratory failure: Currently stable.  Uses 3 L O2 via [place] chronically with exertion and at night. Continue Striverdi and albuterol as needed   Non-small cell lung cancer: S/p SBRT September 2022.  Left lower lung mass unchanged on CTA.   Type 2 diabetes: Continue home metformin    Hypertension: Continue diltiazem.   History of uveitis/ankylosing spondylitis Chronic pain syndrome: Continue CellCept, gabapentin, Cymbalta, Flexeri, tramadol.  Outpatient follow-up PCP.   Mood disorder: Continue home BuSpar, bupropion, Abilify, Latuda, Trileptal.   BPH: Continue Flomax.   OSA: Continue CPAP nightly.  Discharge Diagnoses:  Principal Problem:   Acute pulmonary embolism without acute cor pulmonale (HCC) Active Problems:   Hypertension associated with diabetes ([place])   DM2 (diabetes mellitus, type 2) (HCC)   OSA (obstructive sleep apnea)   COPD (chronic obstructive pulmonary disease) (HCC)   Non-small cell cancer of left lung (HCC)   Mood disorder [facility])    Discharge Instructions  Discharge Instructions     Call MD for:  difficulty breathing, headache or visual disturbances   Complete by: As directed  Call MD for:  extreme fatigue   Complete by: As directed    Call MD for:  persistant dizziness or light-headedness   Complete by: As directed    Call MD for:  persistant nausea  and vomiting   Complete by: As directed    Call MD for:  severe uncontrolled pain   Complete by: As directed    Call MD for:  temperature >100.4   Complete by: As directed    Diet - low sodium heart healthy   Complete by: As directed    Increase activity slowly   Complete by: As directed       Allergies as of 02/04/2021       Reactions   Demerol [meperidine] Nausea And Vomiting, Other (See Comments)   Made the patient "violently sick"   Zocor [simvastatin] Nausea And Vomiting, Other (See Comments)   Made him very jittery, also        Medication List     TAKE these medications    acetaminophen 500 MG tablet Commonly known as: TYLENOL Take 1,000 mg by mouth every 6 (six) hours as needed for moderate pain.   albuterol 108 (90 Base) MCG/ACT inhaler Commonly known as: VENTOLIN HFA Inhale 2 puffs into the lungs every 6 (six) hours as needed for wheezing.   albuterol (2.5 MG/3ML) 0.083% nebulizer solution Commonly known as: PROVENTIL Inhale 3 mLs (2.5 mg total) by nebulization every 4 (four) hours as needed for wheezing or shortness of breath.   aspirin 81 MG EC tablet Take 81 mg by mouth daily.   b complex vitamins capsule Take 1 capsule by mouth daily.   bisacodyl 5 MG EC tablet Commonly known as: DULCOLAX Take 1 tablet (5 mg total) by mouth daily at 12 noon. What changed: when to take this   buPROPion 300 MG 24 hr tablet Commonly known as: WELLBUTRIN XL Take 300 mg by mouth daily.   busPIRone 15 MG tablet Commonly known as: BUSPAR Take 15 mg by mouth daily.   calcium-vitamin D 500-200 MG-UNIT tablet Commonly known as: OSCAL WITH D Take 1 tablet by mouth 2 (two) times daily with a meal.   clotrimazole 1 % cream Commonly known as: LOTRIMIN 1 application daily. for rash   cyclobenzaprine 10 MG tablet Commonly known as: FLEXERIL Take 30 mg by mouth at bedtime.   diltiazem 180 MG 24 hr capsule Commonly known as: CARDIZEM CD Take 180 mg by mouth daily.    docusate sodium 100 MG capsule Commonly known as: COLACE Take 2 capsules (200 mg total) by mouth 2 (two) times daily. What changed: when to take this   DULoxetine 60 MG capsule Commonly known as: CYMBALTA Take 1 capsule (60 mg total) by mouth 2 (two) times daily.   Eliquis DVT/PE Starter Pack Generic drug: Apixaban Starter Pack (10mg  and 5mg ) Take as directed on package: start with two-5mg  tablets twice daily for 7 days. On day 8, switch to one-5mg  tablet twice daily.   feeding supplement Liqd Take 237 mLs by mouth 3 (three) times daily between meals. What changed: when to take this   fluticasone 50 MCG/ACT nasal spray Commonly known as: FLONASE Place 2 sprays into both nostrils daily.   folic acid 1 MG tablet Commonly known as: FOLVITE Take 1 mg by mouth daily.   gabapentin 300 MG capsule Commonly known as: NEURONTIN Take 900 mg by mouth at bedtime.   Garlic Oil 3 MG Caps Take 1,000 mg by mouth daily.   lisinopril  20 MG tablet Commonly known as: ZESTRIL Take 20 mg by mouth daily.   lurasidone 40 MG Tabs tablet Commonly known as: LATUDA Take 40 mg by mouth daily after supper.   melatonin 3 MG Tabs tablet Take 6 mg by mouth at bedtime.   metFORMIN 500 MG tablet Commonly known as: GLUCOPHAGE Take 500 mg by mouth 2 (two) times daily with a meal.   methotrexate 2.5 MG tablet Commonly known as: RHEUMATREX Take 15 mg by mouth once a week. On Saturday- 6 tablets   multivitamin with minerals Tabs tablet Take 1 tablet by mouth daily.   mycophenolate 250 MG capsule Commonly known as: CELLCEPT Take 1,500 mg by mouth 2 (two) times daily.   nitroGLYCERIN 0.4 MG SL tablet Commonly known as: NITROSTAT Place 1 tablet (0.4 mg total) under the tongue every 5 (five) minutes as needed for chest pain.   OVER THE COUNTER MEDICATION Place 1 drop into both ears daily as needed (itching). Miracell ProEar for irritated itchy ears   OVER THE COUNTER MEDICATION Take 2 tablets  by mouth at bedtime. Pure Zzzz   oxcarbazepine 600 MG tablet Commonly known as: TRILEPTAL Take 600-900 mg by mouth See admin instructions. Take 600mg  by mouth twice a day and 900mg  at bedtime   oxyCODONE-acetaminophen 5-325 MG tablet Commonly known as: PERCOCET/ROXICET Take 1-2 tablets by mouth every 6 (six) hours as needed for severe pain.   polyethylene glycol 17 g packet Commonly known as: MIRALAX / GLYCOLAX Take 17 g by mouth daily.   polyvinyl alcohol 1.4 % ophthalmic solution Commonly known as: LIQUIFILM TEARS Place 1 drop into both eyes 4 (four) times daily as needed for dry eyes.   predniSONE 10 MG tablet Commonly known as: DELTASONE Take 4 tablets (40 mg total) by mouth daily for 2 days, THEN 3 tablets (30 mg total) daily for 2 days, THEN 2 tablets (20 mg total) daily for 2 days, THEN 1 tablet (10 mg total) daily for 2 days. Start taking on: February 05, 2021   PRESCRIPTION MEDICATION Inhale into the lungs See admin instructions. CPAP- At bedtime and during during any naps   prochlorperazine 10 MG tablet Commonly known as: COMPAZINE Take 1 tablet (10 mg total) by mouth every 6 (six) hours as needed for nausea or vomiting.   sodium chloride 0.65 % Soln nasal spray Commonly known as: OCEAN Place 2 sprays into both nostrils 4 (four) times daily as needed for congestion.   Striverdi Respimat 2.5 MCG/ACT Aers Generic drug: Olodaterol HCl Inhale 2 puffs into the lungs daily.   Systane Balance 0.6 % Soln Generic drug: Propylene Glycol Place 1 drop into both eyes in the morning, at noon, and at bedtime.   tamsulosin 0.4 MG Caps capsule Commonly known as: FLOMAX Take 0.4 mg by mouth daily.   traMADol 50 MG tablet Commonly known as: ULTRAM Take 100 mg by mouth at bedtime.   traZODone 50 MG tablet Commonly known as: DESYREL Take 75 mg by mouth at bedtime.   VITAMIN A PO Take 1 tablet by mouth daily.   vitamin C 500 MG tablet Commonly known as: ASCORBIC  ACID Take 500 mg by mouth 2 (two) times daily.   Vitamin D-3 25 MCG (1000 UT) Caps Take 1,000 Units by mouth daily.        Follow-up Information     [facility], [facility]. Schedule an appointment as soon as possible for a visit in 1 week(s).   Contact information: [address]  [place] [address] [phone]         Sueanne Margarita, MD .   Specialty: Cardiology Contact information: [id number] [place] [address] [phone]                Allergies  Allergen Reactions   Demerol [Meperidine] Nausea And Vomiting and Other (See Comments)    Made the patient "violently sick"   Zocor [Simvastatin] Nausea And Vomiting and Other (See Comments)    Made him very jittery, also    Consultations: None   Procedures/Studies: DG Chest 2 View  Result Date: 01/20/2021 CLINICAL DATA:  Back pain and rib pain with history ofw lung cancer and COPD also presenting with shortness of breath. EXAM: CHEST - 2 VIEW COMPARISON:  Comparison is made with recent chest CT of November of 2022 and prior chest CT of January 09, 2021. FINDINGS: Cardiomediastinal contours and hilar structures are stable. Signs of LEFT basilar airspace disease and bullous changes at the lung bases. Known LEFT lower lobe mass not well evaluated on the current exam. Blunting of the LEFT costodiaphragmatic sulcus. No pneumothorax. On limited assessment there is no acute skeletal process. IMPRESSION: Post treatment changes and LEFT lung mass a better evaluated on recent CT evaluation showing no gross change. It would be difficult to exclude superimposed infection in the current context. Small LEFT-sided pleural effusion as before. Electronically Signed   By: Zetta Bills M.D.   On: 01/20/2021 20:43   DG Chest 2 View  Result Date: 01/12/2021 CLINICAL DATA:  Chest pain and history of pneumonia EXAM: CHEST - 2 VIEW COMPARISON:  01/08/2021 FINDINGS: Cardiac shadow is  stable. Small left-sided pleural effusion is again seen. Persistent opacity is noted in the left lower lobe consistent with the patient's known mass lesion and the associated effusion. Right basilar atelectasis is noted increased from the prior exam. No other new focal abnormality is seen. IMPRESSION: New right basilar atelectasis. Left lower lobe mass consistent with the given clinical history stable from the prior exam. Small left effusion is noted. Electronically Signed   By: Inez Catalina M.D.   On: 01/12/2021 00:33   DG Chest 2 View  Result Date: 01/08/2021 CLINICAL DATA:  Chest pain and shortness of breath. EXAM: CHEST - 2 VIEW COMPARISON:  Chest x-ray 11/08/2020. CT chest abdomen and pelvis 12/31/2020. FINDINGS: The cardiomediastinal silhouette is within normal limits. There are patchy airspace opacities in the left lung base increased from prior exam. Bullae in the lung bases are stable from prior study. There is no pleural effusion or pneumothorax. There are no acute fractures. IMPRESSION: 1. Patchy left lower lobe airspace disease worrisome for infection. 2.  Emphysema (ICD10-J43.9). Electronically Signed   By: Ronney Asters M.D.   On: 01/08/2021 23:36   CT Chest W Contrast  Result Date: 01/19/2021 CLINICAL DATA:  A 70 year old male presents with history of non-small cell lung cancer for staging evaluation/follow-up. EXAM: CT CHEST WITH CONTRAST TECHNIQUE: Multidetector CT imaging of the chest was performed during intravenous contrast administration. CONTRAST:  80mL OMNIPAQUE IOHEXOL 350 MG/ML SOLN COMPARISON:  January 09, 2021. FINDINGS: Cardiovascular: Calcified atheromatous plaque of the thoracic aorta. Three-vessel coronary artery disease. Heart size stable without substantial pericardial effusion. Central pulmonary vasculature unremarkable on venous phase. Mediastinum/Nodes: No thoracic inlet, axillary, mediastinal or hilar adenopathy. Esophagus grossly normal. Lungs/Pleura: Marked pulmonary  emphysema. Loculated LEFT-sided pleural fluid collection similar to the the prior study. 3.3 x 3.2 cm LEFT lower lobe mass measuring  as large as 5 cm on previous imaging, not substantially changed compared to the exam of December 31, 2020 with some surrounding ground-glass and mild septal thickening in the setting of prior radiation. No new pulmonary nodule. No consolidation. No sign of effusion. Bullous disease in the lingula as before. The airways are patent. Upper Abdomen: Hepatic steatosis. Imaged portions the liver, gallbladder, pancreas, spleen, adrenal glands and kidneys are unremarkable. No acute gastrointestinal process to the extent evaluated. Musculoskeletal: No chest wall mass. No acute bone finding. No destructive bone process. Spinal degenerative changes. IMPRESSION: LEFT lower lobe mass remains slightly greater than 3 cm but is decreased in size compared to more remote studies and shows adjacent post treatment changes and loculated LEFT pleural effusion. These findings are not changed since December 31, 2020. Marked pulmonary emphysema. Three-vessel coronary artery disease. Hepatic steatosis. Aortic Atherosclerosis (ICD10-I70.0) and Emphysema (ICD10-J43.9). Electronically Signed   By: Zetta Bills M.D.   On: 01/19/2021 12:44   CT Angio Chest PE W and/or Wo Contrast  Addendum Date: 02/03/2021   ADDENDUM REPORT: 02/03/2021 21:10 ADDENDUM: These results were called by telephone at the time of interpretation on 02/03/2021 at 9:07 pm to provider [facility] , who verbally acknowledged these results. Electronically Signed   By: Fidela Salisbury M.D.   On: 02/03/2021 21:10   Result Date: 02/03/2021 CLINICAL DATA:  Pulmonary embolism (PE) suspected, high prob EXAM: CT ANGIOGRAPHY CHEST WITH CONTRAST TECHNIQUE: Multidetector CT imaging of the chest was performed using the standard protocol during bolus administration of intravenous contrast. Multiplanar CT image reconstructions and MIPs were obtained to  evaluate the vascular anatomy. CONTRAST:  187mL OMNIPAQUE IOHEXOL 350 MG/ML SOLN COMPARISON:  01/18/2021 FINDINGS: Cardiovascular: The examination is limited by respiratory motion artifact, particularly involving the segmental pulmonary arteries of the lung bases bilaterally. Despite thisq, there is a tiny branching intraluminal filling defect identified within the segmental pulmonary arteries of the right lower lobe in keeping with an acute pulmonary embolus. The embolic burden is tiny there is no CT evidence of right heart strain. The central pulmonary arteries are enlarged in keeping with changes of pulmonary arterial hypertension. Moderate coronary artery calcification. Global cardiac size within normal limits. No pericardial effusion. The thoracic aorta is of normal caliber. Mild mixed atherosclerotic plaque within the descending thoracic aorta. Mediastinum/Nodes: No enlarged mediastinal, hilar, or axillary lymph nodes. Thyroid gland, trachea, and esophagus demonstrate no significant findings. Lungs/Pleura: Severe emphysema. Multiple bulla are again identified within the lung bases bilaterally. Subpleural solid mass within the left lower lobe is not well visualized on this examination due to respiratory motion artifact and phase of contrast administration, but appears grossly unchanged from prior examination at axial image # 109/10. Surrounding ground-glass opacity may reflect the sequela of radiation therapy in the appropriate clinical setting. No new focal pulmonary nodules or infiltrates. No pneumothorax. Small left pleural effusion is stable. No central obstructing lesion. Upper Abdomen: No acute abnormality. Simple cyst again identified within the right hepatic lobe. Musculoskeletal: No acute bone abnormality. No lytic or blastic bone lesion. Review of the MIP images confirms the above findings. IMPRESSION: Technically limited examination, but positive for the presence of acute pulmonary embolism. Tiny  embolic burden. No CT evidence of right heart strain. Moderate coronary artery calcification. Severe emphysema. Known left lower lobe pulmonary mass is grossly unchanged, though not well assessed on this examination. Aortic Atherosclerosis (ICD10-I70.0) and Emphysema (ICD10-J43.9). Electronically Signed: By: Fidela Salisbury M.D. On: 02/03/2021 21:05   CT Angio Chest PE  W and/or Wo Contrast  Result Date: 01/09/2021 CLINICAL DATA:  Chest/back pain EXAM: CT ANGIOGRAPHY CHEST WITH CONTRAST TECHNIQUE: Multidetector CT imaging of the chest was performed using the standard protocol during bolus administration of intravenous contrast. Multiplanar CT image reconstructions and MIPs were obtained to evaluate the vascular anatomy. CONTRAST:  35mL OMNIPAQUE IOHEXOL 350 MG/ML SOLN COMPARISON:  Chest radiographs dated 01/08/2021. CT chest dated 12/31/2020. FINDINGS: Cardiovascular: Satisfactory opacification the bilateral pulmonary arteries to the segmental level. No evidence of pulmonary embolism. Although not tailored for evaluation of the thoracic aorta, there is no evidence of thoracic aneurysm or dissection. The heart is normal in size.  No pericardial effusion. Three vessel coronary atherosclerosis. Mediastinum/Nodes: No suspicious mediastinal lymphadenopathy. Visualized thyroid is unremarkable. Lungs/Pleura: Evaluation of the lung parenchyma is constrained by respiratory motion. 3.6 x 3.1 cm mass in the left lower lobe (series 6/image 112), poorly evaluated on the current study, corresponding to the patient's known primary bronchogenic neoplasm. Moderate emphysematous changes with bullous changes in the bilateral lower lungs. Small left pleural effusion. No pneumothorax. Upper Abdomen: Visualized upper abdomen is grossly unremarkable. Musculoskeletal: Degenerative changes of the visualized thoracolumbar spine. Review of the MIP images confirms the above findings. IMPRESSION: No evidence of pulmonary embolism. 3.6 cm  mass in the left lower lobe, poorly evaluated on the current study due to motion degradation, but corresponding to the patient's known primary bronchogenic neoplasm. Small left pleural effusion. Aortic Atherosclerosis (ICD10-I70.0) and Emphysema (ICD10-J43.9). Electronically Signed   By: Julian Hy M.D.   On: 01/09/2021 03:51   DG Chest Port 1 View  Result Date: 02/03/2021 CLINICAL DATA:  A 70 year male could plans of shortness of breath, history of lung cancer by report. EXAM: PORTABLE CHEST 1 VIEW COMPARISON:  Comparison is made with January 20, 2021. FINDINGS: Cardiomediastinal contours and hilar structures are stable. Persistent and increased LEFT lower lobe airspace disease about the area of the patient's known LEFT lower lobe mass. Appearance of the chest is quite similar to previous imaging. The most inferior aspect of the chest is excluded from view. No signs of pneumothorax. Minimal RIGHT lower lobe atelectasis. On limited assessment there is no acute skeletal process. IMPRESSION: Increased airspace disease at the LEFT lung base is suspected surrounding the area of mass lesion known to exist in this location. This may represent developing pneumonia. Likely with small LEFT effusion. Minimal RIGHT lower lobe atelectasis. Electronically Signed   By: Zetta Bills M.D.   On: 02/03/2021 18:06     Subjective: Seen examined at bedside, resting comfortably.  Remains in ED holding area.  On heparin drip and transition to Eliquis this morning.  Discussed with patient needs to follow-up with his PCP for further refills to complete 28-month course.  Remains on his home oxygen.  Complains of continued irritation to his low back which has been ongoing for the last 3 months.  Discussed with him that needs close follow-up with his PCP, likely due to his chronic pain and ankylosing spondylitis.  No other questions or concerns at this time.  Denies headache, no dizziness, no chest pain, no shortness of breath  more than his normal baseline, no fever/chills/night sweats, no nausea/vomiting/diarrhea, no palpitations, no abdominal pain, no weakness, no fatigue, no paresthesias.  No acute events overnight per nursing staff.  Discharge Exam: Vitals:   02/04/21 1030 02/04/21 1100  BP: (!) 138/93 (!) 159/107  Pulse: 94 90  Resp: 18 16  Temp:    SpO2: 98% 92%  Vitals:   02/04/21 0830 02/04/21 0945 02/04/21 1030 02/04/21 1100  BP: (!) 141/91 136/86 (!) 138/93 (!) 159/107  Pulse: 94 91 94 90  Resp: 19 15 18 16   Temp:      TempSrc:      SpO2: 98% 96% 98% 92%    General: Pt is alert, awake, not in acute distress Cardiovascular: RRR, S1/S2 +, no rubs, no gallops Respiratory: CTA bilaterally, no wheezing, no rhonchi Abdominal: Soft, NT, ND, bowel sounds + Extremities: no edema, no cyanosis    The results of significant diagnostics from this hospitalization (including imaging, microbiology, ancillary and laboratory) are listed below for reference.     Microbiology: Recent Results (from the past 240 hour(s))  Resp Panel by RT-PCR (Flu A&B, Covid) Nasopharyngeal Swab     Status: None   Collection Time: 02/03/21  6:04 PM   Specimen: Nasopharyngeal Swab; Nasopharyngeal(NP) swabs in vial transport medium  Result Value Ref Range Status   SARS Coronavirus 2 by RT PCR NEGATIVE NEGATIVE Final    Comment: (NOTE) SARS-CoV-2 target nucleic acids are NOT DETECTED.  The SARS-CoV-2 RNA is generally detectable in upper respiratory specimens during the acute phase of infection. The lowest concentration of SARS-CoV-2 viral copies this assay can detect is 138 copies/mL. A negative result does not preclude SARS-Cov-2 infection and should not be used as the sole basis for treatment or other patient management decisions. A negative result may occur with  improper specimen collection/handling, submission of specimen other than nasopharyngeal swab, presence of viral mutation(s) within the areas targeted by  this assay, and inadequate number of viral copies(<138 copies/mL). A negative result must be combined with clinical observations, patient history, and epidemiological information. The expected result is Negative.  Fact Sheet for Patients:  EntrepreneurPulse.com.au  Fact Sheet for Healthcare Providers:  IncredibleEmployment.be  This test is no t yet approved or cleared by the [place] FDA and  has been authorized for detection and/or diagnosis of SARS-CoV-2 by FDA under an Emergency Use Authorization (EUA). This EUA will remain  in effect (meaning this test can be used) for the duration of the COVID-19 declaration under Section 564(b)(1) of the Act, 21 U.S.C.section 360bbb-3(b)(1), unless the authorization is terminated  or revoked sooner.       Influenza A by PCR NEGATIVE NEGATIVE Final   Influenza B by PCR NEGATIVE NEGATIVE Final    Comment: (NOTE) The Xpert Xpress SARS-CoV-2/FLU/RSV plus assay is intended as an aid in the diagnosis of influenza from Nasopharyngeal swab specimens and should not be used as a sole basis for treatment. Nasal washings and aspirates are unacceptable for Xpert Xpress SARS-CoV-2/FLU/RSV testing.  Fact Sheet for Patients: EntrepreneurPulse.com.au  Fact Sheet for Healthcare Providers: IncredibleEmployment.be  This test is not yet approved or cleared by the [place] FDA and has been authorized for detection and/or diagnosis of SARS-CoV-2 by FDA under an Emergency Use Authorization (EUA). This EUA will remain in effect (meaning this test can be used) for the duration of the COVID-19 declaration under Section 564(b)(1) of the Act, 21 U.S.C. section 360bbb-3(b)(1), unless the authorization is terminated or revoked.  Performed at [facility], [place] [address], [place], [place] [address]      Labs: BNP (last 3 results) Recent Labs    03/30/20 2034  07/25/20 2140 09/03/20 2119  BNP 57.3 23.7 35.0   Basic Metabolic Panel: Recent Labs  Lab 02/03/21 1803 02/04/21 0502  NA 135 133*  K 4.5 4.3  CL 101 99  CO2 25 24  GLUCOSE 138* 185*  BUN 18 18  CREATININE 0.98 1.09  CALCIUM 9.3 9.2   Liver Function Tests: No results for input(s): AST, ALT, ALKPHOS, BILITOT, PROT, ALBUMIN in the last 168 hours. No results for input(s): LIPASE, AMYLASE in the last 168 hours. No results for input(s): AMMONIA in the last 168 hours. CBC: Recent Labs  Lab 02/03/21 1803 02/04/21 0502  WBC 10.9* 10.6*  HGB 15.6 15.1  HCT 47.0 45.5  MCV 98.3 98.9  PLT 317 301   Cardiac Enzymes: No results for input(s): CKTOTAL, CKMB, CKMBINDEX, TROPONINI in the last 168 hours. BNP: Invalid input(s): POCBNP CBG: No results for input(s): GLUCAP in the last 168 hours. D-Dimer No results for input(s): DDIMER in the last 72 hours. Hgb A1c No results for input(s): HGBA1C in the last 72 hours. Lipid Profile No results for input(s): CHOL, HDL, LDLCALC, TRIG, CHOLHDL, LDLDIRECT in the last 72 hours. Thyroid function studies No results for input(s): TSH, T4TOTAL, T3FREE, THYROIDAB in the last 72 hours.  Invalid input(s): FREET3 Anemia work up No results for input(s): VITAMINB12, FOLATE, FERRITIN, TIBC, IRON, RETICCTPCT in the last 72 hours. Urinalysis    Component Value Date/Time   COLORURINE YELLOW 01/08/2021 2320   APPEARANCEUR CLEAR 01/08/2021 2320   LABSPEC 1.019 01/08/2021 2320   PHURINE 6.0 01/08/2021 2320   GLUCOSEU NEGATIVE 01/08/2021 2320   HGBUR NEGATIVE 01/08/2021 2320   BILIRUBINUR NEGATIVE 01/08/2021 2320   KETONESUR NEGATIVE 01/08/2021 2320   PROTEINUR NEGATIVE 01/08/2021 2320   UROBILINOGEN 1.0 01/12/2013 0024   NITRITE NEGATIVE 01/08/2021 2320   LEUKOCYTESUR NEGATIVE 01/08/2021 2320   Sepsis Labs Invalid input(s): PROCALCITONIN,  WBC,  LACTICIDVEN Microbiology Recent Results (from the past 240 hour(s))  Resp Panel by RT-PCR (Flu  A&B, Covid) Nasopharyngeal Swab     Status: None   Collection Time: 02/03/21  6:04 PM   Specimen: Nasopharyngeal Swab; Nasopharyngeal(NP) swabs in vial transport medium  Result Value Ref Range Status   SARS Coronavirus 2 by RT PCR NEGATIVE NEGATIVE Final    Comment: (NOTE) SARS-CoV-2 target nucleic acids are NOT DETECTED.  The SARS-CoV-2 RNA is generally detectable in upper respiratory specimens during the acute phase of infection. The lowest concentration of SARS-CoV-2 viral copies this assay can detect is 138 copies/mL. A negative result does not preclude SARS-Cov-2 infection and should not be used as the sole basis for treatment or other patient management decisions. A negative result may occur with  improper specimen collection/handling, submission of specimen other than nasopharyngeal swab, presence of viral mutation(s) within the areas targeted by this assay, and inadequate number of viral copies(<138 copies/mL). A negative result must be combined with clinical observations, patient history, and epidemiological information. The expected result is Negative.  Fact Sheet for Patients:  EntrepreneurPulse.com.au  Fact Sheet for Healthcare Providers:  IncredibleEmployment.be  This test is no t yet approved or cleared by the [place] FDA and  has been authorized for detection and/or diagnosis of SARS-CoV-2 by FDA under an Emergency Use Authorization (EUA). This EUA will remain  in effect (meaning this test can be used) for the duration of the COVID-19 declaration under Section 564(b)(1) of the Act, 21 U.S.C.section 360bbb-3(b)(1), unless the authorization is terminated  or revoked sooner.       Influenza A by PCR NEGATIVE NEGATIVE Final   Influenza B by PCR NEGATIVE NEGATIVE Final    Comment: (NOTE) The Xpert Xpress SARS-CoV-2/FLU/RSV plus assay is intended as an aid in the diagnosis of  influenza from Nasopharyngeal swab specimens  and should not be used as a sole basis for treatment. Nasal washings and aspirates are unacceptable for Xpert Xpress SARS-CoV-2/FLU/RSV testing.  Fact Sheet for Patients: EntrepreneurPulse.com.au  Fact Sheet for Healthcare Providers: IncredibleEmployment.be  This test is not yet approved or cleared by the [place] FDA and has been authorized for detection and/or diagnosis of SARS-CoV-2 by FDA under an Emergency Use Authorization (EUA). This EUA will remain in effect (meaning this test can be used) for the duration of the COVID-19 declaration under Section 564(b)(1) of the Act, 21 U.S.C. section 360bbb-3(b)(1), unless the authorization is terminated or revoked.  Performed at [facility], [place] [address] [place], [place] [address]      Time coordinating discharge: Over 30 minutes  SIGNED:   Isao Seltzer J [place] DO  [facility] 02/04/2021, 12:29 PM

## 2021-02-04 NOTE — Progress Notes (Signed)
ANTICOAGULATION CONSULT NOTE - Initial Consult  Pharmacy Consult for apixaban  Indication: pulmonary embolus  Allergies  Allergen Reactions   Demerol [Meperidine] Nausea And Vomiting and Other (See Comments)    Made the patient "violently sick"   Zocor [Simvastatin] Nausea And Vomiting and Other (See Comments)    Made him very jittery, also    Patient Measurements:   Heparin Dosing Weight: Using 102 kg weight in November was 149 kg, ht 74 in  Vital Signs: BP: 141/91 (12/08 0830) Pulse Rate: 94 (12/08 0830)  Labs: Recent Labs    02/03/21 1803 02/03/21 2205 02/04/21 0502  HGB 15.6  --  15.1  HCT 47.0  --  45.5  PLT 317  --  301  APTT  --  29  --   LABPROT  --  12.9  --   INR  --  1.0  --   HEPARINUNFRC  --   --  0.43  CREATININE 0.98  --  1.09  TROPONINIHS 5 5  --      CrCl cannot be calculated (Unknown ideal weight.).   Medical History: Past Medical History:  Diagnosis Date   Anxiety    Bronchitis    COPD (chronic obstructive pulmonary disease) ([place])    Depression    History of radiation therapy    Left lung- 10/05/20-10/15/20- Dr. Gery Pray   Hypertension    lung ca 09/2020   Mental disorder    MI (myocardial infarction) ([place])    ????   OSA (obstructive sleep apnea)    Suicide attempt [facility])    Tension pneumothorax 06/27/2016   Uveitis     Assessment: 70 y/o M with a h/o lung cancer admitted with new PE.   02/04/21 CBC wnl SCr 1.09 ng/dl  Plan to transition from IV heparin to apixaban today   Goal of Therapy:  Heparin level 0.3-0.7 units/ml Monitor platelets by anticoagulation protocol: Yes   Plan:  Stop heparin drip and start apixaban 10 mg PO BID x 7 days then apixaban 5 mg PO BID  Monitor CBC, renal function Monitor for signs and symptoms of bleeding    Royetta Asal, PharmD, BCPS 02/04/2021 8:47 AM

## 2021-02-05 ENCOUNTER — Emergency Department (HOSPITAL_COMMUNITY)
Admission: EM | Admit: 2021-02-05 | Discharge: 2021-02-06 | Disposition: A | Discharge status: Home / Self Care | Payer: No Typology Code available for payment source | Attending: Emergency Medicine | Admitting: Emergency Medicine

## 2021-02-05 ENCOUNTER — Emergency Department (HOSPITAL_COMMUNITY): Payer: No Typology Code available for payment source

## 2021-02-05 DIAGNOSIS — Z85118 Personal history of other malignant neoplasm of bronchus and lung: Secondary | ICD-10-CM | POA: Insufficient documentation

## 2021-02-05 DIAGNOSIS — I10 Essential (primary) hypertension: Secondary | ICD-10-CM | POA: Diagnosis not present

## 2021-02-05 DIAGNOSIS — R072 Precordial pain: Secondary | ICD-10-CM | POA: Insufficient documentation

## 2021-02-05 DIAGNOSIS — M542 Cervicalgia: Secondary | ICD-10-CM | POA: Diagnosis not present

## 2021-02-05 DIAGNOSIS — R000 Tachycardia, unspecified: Secondary | ICD-10-CM | POA: Insufficient documentation

## 2021-02-05 DIAGNOSIS — E119 Type 2 diabetes mellitus without complications: Secondary | ICD-10-CM | POA: Diagnosis not present

## 2021-02-05 DIAGNOSIS — Z7951 Long term (current) use of inhaled steroids: Secondary | ICD-10-CM | POA: Insufficient documentation

## 2021-02-05 DIAGNOSIS — J449 Chronic obstructive pulmonary disease, unspecified: Secondary | ICD-10-CM | POA: Insufficient documentation

## 2021-02-05 DIAGNOSIS — Z7982 Long term (current) use of aspirin: Secondary | ICD-10-CM | POA: Diagnosis not present

## 2021-02-05 DIAGNOSIS — Z79899 Other long term (current) drug therapy: Secondary | ICD-10-CM | POA: Diagnosis not present

## 2021-02-05 DIAGNOSIS — Z87891 Personal history of nicotine dependence: Secondary | ICD-10-CM | POA: Insufficient documentation

## 2021-02-05 DIAGNOSIS — Z7984 Long term (current) use of oral hypoglycemic drugs: Secondary | ICD-10-CM | POA: Diagnosis not present

## 2021-02-05 DIAGNOSIS — R079 Chest pain, unspecified: Secondary | ICD-10-CM

## 2021-02-05 LAB — CBC WITH DIFFERENTIAL/PLATELET
Abs Immature Granulocytes: 0.07 10*3/uL (ref 0.00–0.07)
Basophils Absolute: 0 10*3/uL (ref 0.0–0.1)
Basophils Relative: 0 %
Eosinophils Absolute: 0.1 10*3/uL (ref 0.0–0.5)
Eosinophils Relative: 1 %
HCT: 44.3 % (ref 39.0–52.0)
Hemoglobin: 14.7 g/dL (ref 13.0–17.0)
Immature Granulocytes: 1 %
Lymphocytes Relative: 16 %
Lymphs Abs: 1.9 10*3/uL (ref 0.7–4.0)
MCH: 32.5 pg (ref 26.0–34.0)
MCHC: 33.2 g/dL (ref 30.0–36.0)
MCV: 97.8 fL (ref 80.0–100.0)
Monocytes Absolute: 1.2 10*3/uL — ABNORMAL HIGH (ref 0.1–1.0)
Monocytes Relative: 10 %
Neutro Abs: 8.4 10*3/uL — ABNORMAL HIGH (ref 1.7–7.7)
Neutrophils Relative %: 72 %
Platelets: 298 10*3/uL (ref 150–400)
RBC: 4.53 MIL/uL (ref 4.22–5.81)
RDW: 13.2 % (ref 11.5–15.5)
WBC: 11.6 10*3/uL — ABNORMAL HIGH (ref 4.0–10.5)
nRBC: 0 % (ref 0.0–0.2)

## 2021-02-05 LAB — TROPONIN I (HIGH SENSITIVITY)
Troponin I (High Sensitivity): 4 ng/L (ref <18)
Troponin I (High Sensitivity): 5 ng/L (ref <18)

## 2021-02-05 LAB — BRAIN NATRIURETIC PEPTIDE: B Natriuretic Peptide: 19 pg/mL (ref 0.0–100.0)

## 2021-02-05 MED ORDER — ACETAMINOPHEN 325 MG PO TABS: 650.0000 mg | ORAL_TABLET | Freq: Once | ORAL | Status: DC

## 2021-02-05 MED ORDER — OXYCODONE-ACETAMINOPHEN 5-325 MG PO TABS
1.0000 | ORAL_TABLET | Freq: Once | ORAL | Status: AC
  Administered 2021-02-05: 1 via ORAL
  Filled 2021-02-05: qty 1

## 2021-02-05 MED ORDER — ONDANSETRON HCL 4 MG/2ML IJ SOLN
4.0000 mg | Freq: Once | INTRAMUSCULAR | Status: AC
  Administered 2021-02-05: 4 mg via INTRAVENOUS
  Filled 2021-02-05: qty 2

## 2021-02-05 NOTE — Discharge Instructions (Signed)
Continue your current medications.  Follow-up with your doctor.  REturn as needed for Worsening or recurrent symptoms

## 2021-02-05 NOTE — ED Provider Notes (Signed)
[facility] DEPT Provider Note   CSN: [id number] Arrival date & time: 02/05/21  1456     History Chief Complaint  Patient presents with   Shortness of Breath   Chest Pain    Mario [name] is a 70 y.o. male.  He has a history of COPD and uses oxygen 3 L.  He was seen 2 days ago for pleuritic chest pain shortness of breath and found to have a PE.  He was admitted on heparin and transition to Eliquis.  Today while he was sitting he experienced severe substernal chest pain radiating into his neck.  Lasted about 10 minutes and went away.  He called the [place] and they recommended he come to the hospital.  He is still having a little bit of the pleuritic chest pain that first brought him to the hospital.  No hemoptysis fevers chills.  Baseline shortness of breath.  The history is provided by the patient.  Chest Pain Pain location:  Substernal area Pain quality: aching   Pain radiates to:  Neck Pain severity:  Severe Onset quality:  Sudden Duration:  10 minutes Timing:  Constant Progression:  Resolved Chronicity:  New Context: at rest   Relieved by:  None tried Worsened by:  Nothing Ineffective treatments:  None tried Associated symptoms: shortness of breath   Associated symptoms: no abdominal pain, no diaphoresis, no fever, no headache, no nausea, no palpitations and no vomiting   Risk factors: diabetes mellitus, hypertension, male sex and prior DVT/PE       Past Medical History:  Diagnosis Date   Anxiety    Bronchitis    COPD (chronic obstructive pulmonary disease) (HCC)    Depression    History of radiation therapy    Left lung- 10/05/20-10/15/20- Dr. Gery Pray   Hypertension    lung ca 09/2020   Mental disorder    MI (myocardial infarction) ([place])    ????   OSA (obstructive sleep apnea)    Suicide attempt [facility])    Tension pneumothorax 06/27/2016   Uveitis     Patient Active Problem List   Diagnosis Date Noted   Acute pulmonary  embolism without acute cor pulmonale ([place]) 02/03/2021   Mood disorder ([place]) 02/03/2021   Non-small cell cancer of left lung ([place]) 09/21/2020   Lung nodule 07/30/2020   Mass of left lung    Acute respiratory failure with hypoxemia ([place]) 07/26/2020   Constipation    CAP (community acquired pneumonia) 04/03/2020   COPD with acute exacerbation ([place]) 01/05/2020   Vertigo 12/26/2019   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   Stage 3a chronic kidney disease ([place]) 12/26/2019   Abnormal CT of the chest 12/26/2019   COPD (chronic obstructive pulmonary disease) ([place])    AKI (acute kidney injury) ([place])    Hyperglycemia 06/23/2019   OSA (obstructive sleep apnea) 06/23/2019   Suicidal ideation 06/23/2019   Major depressive disorder, recurrent episode ([place]) 10/12/2018   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   Cellulitis of both feet 07/16/2017   DM2 (diabetes mellitus, type 2) ([place]) 07/16/2017   HLA B27 (HLA B27 positive) 07/16/2017   Acute chest pain    Hyperkalemia    Spontaneous pneumothorax 06/27/2016   Hypoxia    Pneumothorax on left    Dyspnea 05/23/2014   COPD exacerbation ([place]) 05/23/2014   Malingering 01/21/2013   Depression 01/11/2013   Tobacco abuse 01/11/2013   Obesity, unspecified 01/11/2013   Chest pain 01/10/2013  Hypertension associated with diabetes ([place]) 01/10/2013    Past Surgical History:  Procedure Laterality Date   BRONCHIAL BIOPSY  07/30/2020   Procedure: BRONCHIAL BIOPSIES;  Surgeon: Garner Nash, DO;  Location: [facility];  Service: Pulmonary;;   BRONCHIAL BRUSHINGS  07/30/2020   Procedure: BRONCHIAL BRUSHINGS;  Surgeon: Garner Nash, DO;  Location: [place];  Service: Pulmonary;;   BRONCHIAL NEEDLE ASPIRATION BIOPSY  07/30/2020   Procedure: BRONCHIAL NEEDLE ASPIRATION BIOPSIES;  Surgeon: Garner Nash, DO;  Location: [facility];  Service: Pulmonary;;   BRONCHIAL WASHINGS  07/30/2020   Procedure: BRONCHIAL WASHINGS;   Surgeon: Garner Nash, DO;  Location: [facility];  Service: Pulmonary;;   CHEST TUBE INSERTION Left 06/27/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     SKIN CANCER EXCISION     VIDEO BRONCHOSCOPY WITH ENDOBRONCHIAL NAVIGATION Left 07/30/2020   Procedure: VIDEO BRONCHOSCOPY WITH ENDOBRONCHIAL NAVIGATION;  Surgeon: Garner Nash, DO;  Location: [facility] [place];  Service: Pulmonary;  Laterality: Left;       Family History  Problem Relation Age of Onset   Dementia Father     Social History   Tobacco Use   Smoking status: Former    Packs/day: 1.00    Years: 35.00    Pack years: 35.00    Types: Cigarettes    Quit date: 05/2016    Years since quitting: 4.6   Smokeless tobacco: Never  Vaping Use   Vaping Use: Never used  Substance Use Topics   Alcohol use: No    Alcohol/week: 0.0 standard drinks    Comment: denies use of any drugs or alcohol   Drug use: No    Home Medications Prior to Admission medications   Medication Sig Start Date End Date Taking? Authorizing Provider  acetaminophen (TYLENOL) 500 MG tablet Take 1,000 mg by mouth every 6 (six) hours as needed for moderate pain.    [provider]  albuterol (PROVENTIL) (2.5 MG/3ML) 0.083% nebulizer solution Inhale 3 mLs (2.5 mg total) by nebulization every 4 (four) hours as needed for wheezing or shortness of breath. 08/04/20 08/04/21  Lavina Hamman, MD  albuterol (VENTOLIN HFA) 108 (90 Base) MCG/ACT inhaler Inhale 2 puffs into the lungs every 6 (six) hours as needed for wheezing. 06/02/20   [provider]  APIXABAN Arne Cleveland) VTE STARTER PACK (10MG AND 5MG) Take as directed on package: start with two-33m tablets twice daily for 7 days. On day 8, switch to one-539mtablet twice daily. 02/04/21   [place]ErDonnamarie PoagDO  aspirin 81 MG EC tablet Take 81 mg by mouth daily.    [provider]  b complex vitamins capsule Take 1 capsule by mouth daily.    [provider]  bisacodyl (DULCOLAX) 5 MG EC tablet Take 1 tablet (5  mg total) by mouth daily at 12 noon. Patient taking differently: Take 5 mg by mouth daily. 08/04/20   PaLavina HammanMD  buPROPion (WELLBUTRIN XL) 300 MG 24 hr tablet Take 300 mg by mouth daily. 02/05/20   [provider]  busPIRone (BUSPAR) 15 MG tablet Take 15 mg by mouth daily.    [provider]  calcium-vitamin D (OSCAL WITH D) 500-200 MG-UNIT tablet Take 1 tablet by mouth 2 (two) times daily with a meal.    [provider]  Cholecalciferol (VITAMIN D-3) 25 MCG (1000 UT) CAPS Take 1,000 Units by mouth daily.    [provider]  clotrimazole (LOTRIMIN) 1 % cream 1 application daily. for rash  01/25/21   [provider]  cyclobenzaprine (FLEXERIL) 10 MG tablet Take 30 mg by mouth at bedtime.    [provider]  diltiazem (CARDIZEM CD) 180 MG 24 hr capsule Take 180 mg by mouth daily.    [provider]  docusate sodium (COLACE) 100 MG capsule Take 2 capsules (200 mg total) by mouth 2 (two) times daily. Patient taking differently: Take 200 mg by mouth daily. 08/04/20   Lavina Hamman, MD  DULoxetine (CYMBALTA) 60 MG capsule Take 1 capsule (60 mg total) by mouth 2 (two) times daily. 07/21/17   Lavina Hamman, MD  feeding supplement (ENSURE ENLIVE / ENSURE PLUS) LIQD Take 237 mLs by mouth 3 (three) times daily between meals. Patient taking differently: Take 237 mLs by mouth daily. 08/04/20   Lavina Hamman, MD  fluticasone Asencion Islam) 50 MCG/ACT nasal spray Place 2 sprays into both nostrils daily.     [provider]  folic acid (FOLVITE) 1 MG tablet Take 1 mg by mouth daily.    [provider]  gabapentin (NEURONTIN) 300 MG capsule Take 900 mg by mouth at bedtime.    [provider]  Garlic Oil 3 MG CAPS Take 1,000 mg by mouth daily.    [provider]  lisinopril (ZESTRIL) 20 MG tablet Take 20 mg by mouth daily.    [provider]  lurasidone (LATUDA) 40 MG TABS tablet Take 40 mg by mouth daily  after supper.    [provider]  Melatonin 3 MG TABS Take 6 mg by mouth at bedtime.    [provider]  metFORMIN (GLUCOPHAGE) 500 MG tablet Take 500 mg by mouth 2 (two) times daily with a meal.    [provider]  methotrexate (RHEUMATREX) 2.5 MG tablet Take 15 mg by mouth once a week. On Saturday- 6 tablets    [provider]  Multiple Vitamin (MULTIVITAMIN WITH MINERALS) TABS tablet Take 1 tablet by mouth daily.    [provider]  mycophenolate (CELLCEPT) 250 MG capsule Take 1,500 mg by mouth 2 (two) times daily.    [provider]  nitroGLYCERIN (NITROSTAT) 0.4 MG SL tablet Place 1 tablet (0.4 mg total) under the tongue every 5 (five) minutes as needed for chest pain. 07/03/19   Danford, Suann Larry, MD  Olodaterol HCl (STRIVERDI RESPIMAT) 2.5 MCG/ACT AERS Inhale 2 puffs into the lungs daily.    [provider]  OVER THE COUNTER MEDICATION Place 1 drop into both ears daily as needed (itching). Miracell ProEar for irritated itchy ears    [provider]  OVER THE COUNTER MEDICATION Take 2 tablets by mouth at bedtime. Pure Zzzz    [provider]  oxcarbazepine (TRILEPTAL) 600 MG tablet Take 600-900 mg by mouth See admin instructions. Take 649m by mouth twice a day and 9049mat bedtime 07/16/20   [provider]  oxyCODONE-acetaminophen (PERCOCET/ROXICET) 5-325 MG tablet Take 1-2 tablets by mouth every 6 (six) hours as needed for severe pain. 01/20/21   AlLacretia LeighMD  polyethylene glycol (MIRALAX / GLYCOLAX) 17 g packet Take 17 g by mouth daily.    [provider]  polyvinyl alcohol (LIQUIFILM TEARS) 1.4 % ophthalmic solution Place 1 drop into both eyes 4 (four) times daily as needed for dry eyes.    [provider]  predniSONE (DELTASONE) 10 MG tablet Take 4 tablets (40 mg total) by mouth daily for 2 days, THEN 3 tablets (30 mg total) daily for  2 days, THEN 2 tablets (20 mg total) daily  for 2 days, THEN 1 tablet (10 mg total) daily for 2 days. 02/05/21 02/13/21  [place] Eric J, DO  PRESCRIPTION MEDICATION Inhale into the lungs See admin instructions. CPAP- At bedtime and during during any naps    [provider]  prochlorperazine (COMPAZINE) 10 MG tablet Take 1 tablet (10 mg total) by mouth every 6 (six) hours as needed for nausea or vomiting. 94/80/16   Delora Fuel, MD  Propylene Glycol (SYSTANE BALANCE) 0.6 % SOLN Place 1 drop into both eyes in the morning, at noon, and at bedtime.     [provider]  sodium chloride (OCEAN) 0.65 % SOLN nasal spray Place 2 sprays into both nostrils 4 (four) times daily as needed for congestion.    [provider]  tamsulosin (FLOMAX) 0.4 MG CAPS capsule Take 0.4 mg by mouth daily.    [provider]  traMADol (ULTRAM) 50 MG tablet Take 100 mg by mouth at bedtime.    [provider]  traZODone (DESYREL) 50 MG tablet Take 75 mg by mouth at bedtime.    [provider]  VITAMIN A PO Take 1 tablet by mouth daily.    [provider]  vitamin C (ASCORBIC ACID) 500 MG tablet Take 500 mg by mouth 2 (two) times daily.     [provider]    Allergies    Demerol [meperidine] and Zocor [simvastatin]  Review of Systems   Review of Systems  Constitutional:  Negative for diaphoresis and fever.  HENT:  Negative for sore throat.   Eyes:  Negative for visual disturbance.  Respiratory:  Positive for shortness of breath.   Cardiovascular:  Positive for chest pain. Negative for palpitations.  Gastrointestinal:  Negative for abdominal pain, nausea and vomiting.  Genitourinary:  Negative for dysuria.  Musculoskeletal:  Positive for neck pain.  Skin:  Negative for rash.  Neurological:  Negative for headaches.   Physical Exam Updated Vital Signs BP (!) 151/84 (BP Location: Right Arm)   Pulse (!) 101   Temp 98.2 F (36.8 C) (Oral)   Resp 18   Ht 6' 2" (1.88 m)   Wt (!) 147 kg    SpO2 94%   BMI 41.60 kg/m   Physical Exam Vitals and nursing note reviewed.  Constitutional:      General: He is not in acute distress.    Appearance: He is well-developed. He is obese.  HENT:     Head: Normocephalic and atraumatic.  Eyes:     Conjunctiva/sclera: Conjunctivae normal.  Cardiovascular:     Rate and Rhythm: Regular rhythm. Tachycardia present.     Heart sounds: No murmur heard. Pulmonary:     Effort: Pulmonary effort is normal. No respiratory distress.     Breath sounds: Normal breath sounds.  Abdominal:     Palpations: Abdomen is soft.     Tenderness: There is no abdominal tenderness. There is no guarding or rebound.  Musculoskeletal:        General: No swelling.     Cervical back: Neck supple.     Right lower leg: No tenderness. No edema.     Left lower leg: No tenderness. No edema.  Skin:    General: Skin is warm and dry.     Capillary Refill: Capillary refill takes less than 2 seconds.  Neurological:     General: No focal deficit present.     Mental Status: He is alert.  Psychiatric:        Mood and Affect: Mood normal.    ED Results / Procedures / Treatments   Labs (all labs ordered are listed, but only abnormal results are displayed) Labs Reviewed  CBC WITH DIFFERENTIAL/PLATELET - Abnormal; Notable for the following components:      Result Value   WBC 11.6 (*)    Neutro Abs 8.4 (*)    Monocytes Absolute 1.2 (*)    All other components within normal limits  BRAIN NATRIURETIC PEPTIDE  TROPONIN I (HIGH SENSITIVITY)  TROPONIN I (HIGH SENSITIVITY)    EKG EKG Interpretation  Date/Time:  Friday February 05 2021 16:02:40 EST Ventricular Rate:  99 PR Interval:  200 QRS Duration: 108 QT Interval:  337 QTC Calculation: 433 R Axis:   16 Text Interpretation: Sinus rhythm Low voltage, precordial leads No significant change since last tracing Confirmed by Dorie Rank [phone]) on 02/05/2021 5:48:15 PM  Radiology CT Angio Chest PE W and/or Wo  Contrast  Addendum Date: 02/03/2021   ADDENDUM REPORT: 02/03/2021 21:10 ADDENDUM: These results were called by telephone at the time of interpretation on 02/03/2021 at 9:07 pm to provider Coral Ceo , who verbally acknowledged these results. Electronically Signed   By: Fidela Salisbury M.D.   On: 02/03/2021 21:10   Result Date: 02/03/2021 CLINICAL DATA:  Pulmonary embolism (PE) suspected, high prob EXAM: CT ANGIOGRAPHY CHEST WITH CONTRAST TECHNIQUE: Multidetector CT imaging of the chest was performed using the standard protocol during bolus administration of intravenous contrast. Multiplanar CT image reconstructions and MIPs were obtained to evaluate the vascular anatomy. CONTRAST:  14m OMNIPAQUE IOHEXOL 350 MG/ML SOLN COMPARISON:  01/18/2021 FINDINGS: Cardiovascular: The examination is limited by respiratory motion artifact, particularly involving the segmental pulmonary arteries of the lung bases bilaterally. Despite thisq, there is a tiny branching intraluminal filling defect identified within the segmental pulmonary arteries of the right lower lobe in keeping with an acute pulmonary embolus. The embolic burden is tiny there is no CT evidence of right heart strain. The central pulmonary arteries are enlarged in keeping with changes of pulmonary arterial hypertension. Moderate coronary artery calcification. Global cardiac size within normal limits. No pericardial effusion. The thoracic aorta is of normal caliber. Mild mixed atherosclerotic plaque within the descending thoracic aorta. Mediastinum/Nodes: No enlarged mediastinal, hilar, or axillary lymph nodes. Thyroid gland, trachea, and esophagus demonstrate no significant findings. Lungs/Pleura: Severe emphysema. Multiple bulla are again identified within the lung bases bilaterally. Subpleural solid mass within the left lower lobe is not well visualized on this examination due to respiratory motion artifact and phase of contrast administration, but appears  grossly unchanged from prior examination at axial image # 109/10. Surrounding ground-glass opacity may reflect the sequela of radiation therapy in the appropriate clinical setting. No new focal pulmonary nodules or infiltrates. No pneumothorax. Small left pleural effusion is stable. No central obstructing lesion. Upper Abdomen: No acute abnormality. Simple cyst again identified within the right hepatic lobe. Musculoskeletal: No acute bone abnormality. No lytic or blastic bone lesion. Review of the MIP images confirms the above findings. IMPRESSION: Technically limited examination, but positive for the presence of acute pulmonary embolism. Tiny embolic burden. No CT evidence of right heart strain. Moderate coronary artery calcification. Severe emphysema. Known left lower lobe pulmonary mass is grossly unchanged, though not well assessed on this examination. Aortic Atherosclerosis (ICD10-I70.0) and Emphysema (ICD10-J43.9). Electronically Signed: By: AFidela SalisburyM.D. On: 02/03/2021 21:05   DG Chest Port 1 View  Result Date: 02/05/2021 CLINICAL  DATA:  Chest pain and shortness of breath. Recently diagnosed with a PE. History of lung cancer. EXAM: PORTABLE CHEST 1 VIEW COMPARISON:  Chest radiograph and CTA 02/03/2021 FINDINGS: The cardiomediastinal silhouette is unchanged with normal heart size. A small left pleural effusion is unchanged. Patchy left basilar opacity is also unchanged and is in the region of the patient's known mass. Minimal right basilar opacity is unchanged and likely reflects atelectasis. No pneumothorax is identified. No acute osseous abnormality is seen. IMPRESSION: Unchanged small left pleural effusion and left basilar airspace disease. Electronically Signed   By: Logan Bores M.D.   On: 02/05/2021 16:15    Procedures Procedures   Medications Ordered in ED Medications  ondansetron (ZOFRAN) injection 4 mg (4 mg Intravenous Given 02/05/21 1815)    ED Course  I have reviewed the triage  vital signs and the nursing notes.  Pertinent labs & imaging results that were available during my care of the patient were reviewed by me and considered in my medical decision making (see chart for details).  Clinical Course as of 02/05/21 1933  Ludwig Clarks Feb 05, 2021  1610 Chest x-ray interpreted by me as opacity left lower lobe similar to prior x-ray 12/7.  Awaiting radiology reading. [MB]    Clinical Course User Index [MB] Hayden Rasmussen, MD   MDM Rules/Calculators/A&P                          This patient complains of chest pain in the setting of recent PE; this involves an extensive number of treatment Options and is a complaint that carries with it a high risk of complications and Morbidity. The differential includes ACS, pneumonia, pneumothorax, PE, vascular, reflux, musculoskeletal  I ordered, reviewed and interpreted labs, which included CBC with elevated white count stable hemoglobin, troponin x1 unremarkable, BNP not elevated  I ordered imaging studies which included chest x-ray and I independently    visualized and interpreted imaging which showed left basilar airspace disease and small effusion  Previous records obtained and reviewed in epic including prior ED visit and admission  After the interventions stated above, I reevaluated the patient and found patient to be pain-free other than mild pleuritic component from his prior PE.  Not requiring additional oxygen.  Heart rate not elevated currently.  His care is signed out to oncoming provider Dr. Tomi Bamberger to follow-up on delta troponin.  Do not feel he needs additional imaging including CT angio as is already on adequate treatment.   Final Clinical Impression(s) / ED Diagnoses Final diagnoses:  Chest pain, unspecified type    Rx / DC Orders ED Discharge Orders     None        Hayden Rasmussen, MD 02/05/21 [phone]

## 2021-02-05 NOTE — ED Triage Notes (Signed)
Pt arrived via [employer] home.   Called out for chest pain, shob, and dyspnea. Chest pain lasted about 2 min at home.   Pt was diagnosed with a PE lastnight at ED.   Pt took his first Eliquis today.   A/Ox4 Ambulatory with cane with ems (Exertional shob)

## 2021-02-05 NOTE — ED Provider Notes (Signed)
Patient was seen by Dr. Melina Copa initially.  Please see his note.  Plan was to follow-up on the patient's laboratory tests.  Serial troponins are normal.  X-ray shows persistent airspace disease in the left lower lung.  This is the same area of his known malignancy.  Patient not having any issues with tachycardia or acute coronary syndrome or heart strain with his recent PE diagnosis.  Appears stable for discharge and continued outpatient management   Dorie Rank, MD 02/05/21 1930

## 2021-02-06 NOTE — ED Notes (Signed)
Spoke with patient. He states that he "never said his chest pain had to do with his heart." He tells me that his ribs are what are bothering him and have been for 6 weeks. He states that oral pain medications don't work, but a shot of morphine might. Provider made aware. Patient updated that we are continuing to wait on PTAR and if he feels that he needs to be reassessed by a provider and taken off of the PTAR list to let us know. He states that he would continue to wait for discharge with PTAR.

## 2021-02-06 NOTE — ED Notes (Signed)
Pt left with PTAR

## 2021-02-06 NOTE — ED Notes (Signed)
Patient complain of Chest Pain stating I got sharp pain on my chest radiating to my right rib. EDP informed. Ordered EKG and recheck VS. Pt upset stating "Why you're not doing anything that can help me". RN explain everything was been done for him by the EDP and they're is nothing that they can further do at this time. Charge RN informed.

## 2021-05-19 ENCOUNTER — Inpatient Hospital Stay (HOSPITAL_COMMUNITY)
Admission: EM | Admit: 2021-05-19 | Discharge: 2021-05-21 | DRG: 190 | Disposition: A | Discharge status: Home Health | Payer: No Typology Code available for payment source | Attending: Internal Medicine | Admitting: Internal Medicine

## 2021-05-19 ENCOUNTER — Emergency Department (HOSPITAL_COMMUNITY): Payer: No Typology Code available for payment source

## 2021-05-19 ENCOUNTER — Other Ambulatory Visit: Payer: Self-pay

## 2021-05-19 DIAGNOSIS — Z79899 Other long term (current) drug therapy: Secondary | ICD-10-CM

## 2021-05-19 DIAGNOSIS — G4733 Obstructive sleep apnea (adult) (pediatric): Secondary | ICD-10-CM | POA: Diagnosis present

## 2021-05-19 DIAGNOSIS — E119 Type 2 diabetes mellitus without complications: Secondary | ICD-10-CM | POA: Diagnosis present

## 2021-05-19 DIAGNOSIS — J440 Chronic obstructive pulmonary disease with acute lower respiratory infection: Principal | ICD-10-CM | POA: Diagnosis present

## 2021-05-19 DIAGNOSIS — M549 Dorsalgia, unspecified: Secondary | ICD-10-CM | POA: Diagnosis present

## 2021-05-19 DIAGNOSIS — Z888 Allergy status to other drugs, medicaments and biological substances status: Secondary | ICD-10-CM

## 2021-05-19 DIAGNOSIS — Z923 Personal history of irradiation: Secondary | ICD-10-CM

## 2021-05-19 DIAGNOSIS — J9620 Acute and chronic respiratory failure, unspecified whether with hypoxia or hypercapnia: Secondary | ICD-10-CM

## 2021-05-19 DIAGNOSIS — F32A Depression, unspecified: Secondary | ICD-10-CM | POA: Diagnosis present

## 2021-05-19 DIAGNOSIS — J9621 Acute and chronic respiratory failure with hypoxia: Secondary | ICD-10-CM | POA: Diagnosis present

## 2021-05-19 DIAGNOSIS — J441 Chronic obstructive pulmonary disease with (acute) exacerbation: Principal | ICD-10-CM

## 2021-05-19 DIAGNOSIS — R000 Tachycardia, unspecified: Secondary | ICD-10-CM | POA: Diagnosis not present

## 2021-05-19 DIAGNOSIS — Z7960 Long term (current) use of unspecified immunomodulators and immunosuppressants: Secondary | ICD-10-CM

## 2021-05-19 DIAGNOSIS — J449 Chronic obstructive pulmonary disease, unspecified: Secondary | ICD-10-CM | POA: Diagnosis present

## 2021-05-19 DIAGNOSIS — Z885 Allergy status to narcotic agent status: Secondary | ICD-10-CM

## 2021-05-19 DIAGNOSIS — F419 Anxiety disorder, unspecified: Secondary | ICD-10-CM | POA: Diagnosis present

## 2021-05-19 DIAGNOSIS — H209 Unspecified iridocyclitis: Secondary | ICD-10-CM | POA: Diagnosis present

## 2021-05-19 DIAGNOSIS — Z9981 Dependence on supplemental oxygen: Secondary | ICD-10-CM

## 2021-05-19 DIAGNOSIS — E871 Hypo-osmolality and hyponatremia: Secondary | ICD-10-CM | POA: Diagnosis present

## 2021-05-19 DIAGNOSIS — J189 Pneumonia, unspecified organism: Secondary | ICD-10-CM | POA: Diagnosis present

## 2021-05-19 DIAGNOSIS — C3492 Malignant neoplasm of unspecified part of left bronchus or lung: Secondary | ICD-10-CM | POA: Diagnosis present

## 2021-05-19 DIAGNOSIS — Z87891 Personal history of nicotine dependence: Secondary | ICD-10-CM

## 2021-05-19 DIAGNOSIS — Z7982 Long term (current) use of aspirin: Secondary | ICD-10-CM

## 2021-05-19 DIAGNOSIS — Z86711 Personal history of pulmonary embolism: Secondary | ICD-10-CM

## 2021-05-19 DIAGNOSIS — F5105 Insomnia due to other mental disorder: Secondary | ICD-10-CM | POA: Diagnosis present

## 2021-05-19 DIAGNOSIS — Z7901 Long term (current) use of anticoagulants: Secondary | ICD-10-CM

## 2021-05-19 DIAGNOSIS — K5900 Constipation, unspecified: Secondary | ICD-10-CM | POA: Diagnosis present

## 2021-05-19 DIAGNOSIS — G8929 Other chronic pain: Secondary | ICD-10-CM | POA: Diagnosis present

## 2021-05-19 DIAGNOSIS — M459 Ankylosing spondylitis of unspecified sites in spine: Secondary | ICD-10-CM | POA: Diagnosis present

## 2021-05-19 DIAGNOSIS — Z20822 Contact with and (suspected) exposure to covid-19: Secondary | ICD-10-CM | POA: Diagnosis present

## 2021-05-19 DIAGNOSIS — I10 Essential (primary) hypertension: Secondary | ICD-10-CM | POA: Diagnosis present

## 2021-05-19 DIAGNOSIS — Z85118 Personal history of other malignant neoplasm of bronchus and lung: Secondary | ICD-10-CM

## 2021-05-19 DIAGNOSIS — N400 Enlarged prostate without lower urinary tract symptoms: Secondary | ICD-10-CM | POA: Diagnosis present

## 2021-05-19 DIAGNOSIS — Z7984 Long term (current) use of oral hypoglycemic drugs: Secondary | ICD-10-CM

## 2021-05-19 DIAGNOSIS — L304 Erythema intertrigo: Secondary | ICD-10-CM | POA: Diagnosis present

## 2021-05-19 LAB — CBC WITH DIFFERENTIAL/PLATELET
Abs Immature Granulocytes: 0.07 10*3/uL (ref 0.00–0.07)
Basophils Absolute: 0.1 10*3/uL (ref 0.0–0.1)
Basophils Relative: 1 %
Eosinophils Absolute: 0.2 10*3/uL (ref 0.0–0.5)
Eosinophils Relative: 2 %
HCT: 43 % (ref 39.0–52.0)
Hemoglobin: 14.7 g/dL (ref 13.0–17.0)
Immature Granulocytes: 1 %
Lymphocytes Relative: 11 %
Lymphs Abs: 1.2 10*3/uL (ref 0.7–4.0)
MCH: 32.6 pg (ref 26.0–34.0)
MCHC: 34.2 g/dL (ref 30.0–36.0)
MCV: 95.3 fL (ref 80.0–100.0)
Monocytes Absolute: 0.5 10*3/uL (ref 0.1–1.0)
Monocytes Relative: 5 %
Neutro Abs: 9.2 10*3/uL — ABNORMAL HIGH (ref 1.7–7.7)
Neutrophils Relative %: 80 %
Platelets: 329 10*3/uL (ref 150–400)
RBC: 4.51 MIL/uL (ref 4.22–5.81)
RDW: 14.1 % (ref 11.5–15.5)
WBC: 11.2 10*3/uL — ABNORMAL HIGH (ref 4.0–10.5)
nRBC: 0 % (ref 0.0–0.2)

## 2021-05-19 LAB — I-STAT VENOUS BLOOD GAS, ED
Acid-Base Excess: 0 mmol/L (ref 0.0–2.0)
Bicarbonate: 23.1 mmol/L (ref 20.0–28.0)
Calcium, Ion: 1.13 mmol/L — ABNORMAL LOW (ref 1.15–1.40)
HCT: 45 % (ref 39.0–52.0)
Hemoglobin: 15.3 g/dL (ref 13.0–17.0)
O2 Saturation: 90 %
Potassium: 4.6 mmol/L (ref 3.5–5.1)
Sodium: 132 mmol/L — ABNORMAL LOW (ref 135–145)
TCO2: 24 mmol/L (ref 22–32)
pCO2, Ven: 33 mmHg — ABNORMAL LOW (ref 44–60)
pH, Ven: 7.454 — ABNORMAL HIGH (ref 7.25–7.43)
pO2, Ven: 54 mmHg — ABNORMAL HIGH (ref 32–45)

## 2021-05-19 LAB — COMPREHENSIVE METABOLIC PANEL
ALT: 24 U/L (ref 0–44)
AST: 23 U/L (ref 15–41)
Albumin: 3.8 g/dL (ref 3.5–5.0)
Alkaline Phosphatase: 84 U/L (ref 38–126)
Anion gap: 11 (ref 5–15)
BUN: 10 mg/dL (ref 8–23)
CO2: 22 mmol/L (ref 22–32)
Calcium: 9.3 mg/dL (ref 8.9–10.3)
Chloride: 99 mmol/L (ref 98–111)
Creatinine, Ser: 1.05 mg/dL (ref 0.61–1.24)
GFR, Estimated: >60 mL/min (ref >60)
Glucose, Bld: 145 mg/dL — ABNORMAL HIGH (ref 70–99)
Potassium: 4.5 mmol/L (ref 3.5–5.1)
Sodium: 132 mmol/L — ABNORMAL LOW (ref 135–145)
Total Bilirubin: 0.6 mg/dL (ref 0.3–1.2)
Total Protein: 6.7 g/dL (ref 6.5–8.1)

## 2021-05-19 LAB — RESP PANEL BY RT-PCR (FLU A&B, COVID) ARPGX2
Influenza A by PCR: NEGATIVE
Influenza B by PCR: NEGATIVE
SARS Coronavirus 2 by RT PCR: NEGATIVE

## 2021-05-19 LAB — TROPONIN I (HIGH SENSITIVITY)
Troponin I (High Sensitivity): 8 ng/L (ref <18)
Troponin I (High Sensitivity): 8 ng/L (ref <18)

## 2021-05-19 LAB — BRAIN NATRIURETIC PEPTIDE: B Natriuretic Peptide: 15.6 pg/mL (ref 0.0–100.0)

## 2021-05-19 MED ORDER — SODIUM CHLORIDE 0.9 % IV SOLN
1.0000 g | Freq: Once | INTRAVENOUS | Status: AC
  Administered 2021-05-19: 1 g via INTRAVENOUS
  Filled 2021-05-19: qty 10

## 2021-05-19 MED ORDER — SODIUM CHLORIDE 0.9 % IV BOLUS
500.0000 mL | Freq: Once | INTRAVENOUS | Status: AC
  Administered 2021-05-19: 500 mL via INTRAVENOUS

## 2021-05-19 MED ORDER — IPRATROPIUM-ALBUTEROL 0.5-2.5 (3) MG/3ML IN SOLN
3.0000 mL | Freq: Once | RESPIRATORY_TRACT | Status: AC
  Administered 2021-05-19: 3 mL via RESPIRATORY_TRACT
  Filled 2021-05-19: qty 3

## 2021-05-19 MED ORDER — IOHEXOL 350 MG/ML SOLN
63.0000 mL | Freq: Once | INTRAVENOUS | Status: AC | PRN
  Administered 2021-05-19: 63 mL via INTRAVENOUS

## 2021-05-19 NOTE — ED Triage Notes (Signed)
Pt arrives to ED BIB [facility] c/o SHOB. Per EMS pt has increasing SHOB over the past couple of weeks. Per EMS pt was Dx recently with a PE and is on Eliquis and Aspirin. Hx of COPD and Lung Cancer. Is on 3L [place]. EMS administered in route Atrovent and 124mg  Solumedrol. Pt also c/o constipation x1 week. Pt a/o x4, No distress noted at this time. ? ?BP 160/96 ?HR 100 ?R 24 ?O2 94% 3L [place] ?CBG 151 ?

## 2021-05-19 NOTE — ED Provider Notes (Signed)
?[facility] ?Provider Note ? ? ?CSN: [id number] ?Arrival date & time: 05/19/21  1945 ? ?  ? ?History ? ?Chief Complaint  ?Patient presents with  ? Shortness of Breath  ? ? ?Mario [name] is a 71 y.o. male with a past medical history significant for hypertension, diabetes, kidney disease, tobacco abuse, COPD, lung cancer, recent pulmonary embolism currently on Eliquis, aspirin.  He reports that he has had worsening shortness of breath for around 2 weeks, as well as constipation for 1 week.  He is on 3 L nasal cannula chronically.  He reports he has been getting radiation for his lung cancer but is currently on pause.  He reports that his pulmonary embolism was diagnosed 6 weeks ago, he has been taking his medication as prescribed with no missed doses.  Patient reports that he does have a history of chronic constipation, endorses taking MiraLAX daily as well as docusate twice daily.  He reports that he has had 1 small bowel movement in the last week, is still passing gas.  He denies significant abdominal pain.  He reports some occasional nausea but reports that is easily controlled with Zofran.  With his shortness of breath on exertion patient endorses some occasional chest pressure on the left side.  Patient denies previous history of ACS, reports previous stress test and cardiac catheterization showing some coronary artery disease but did not have any stents placed.  Patient denies any fever, chills.  He denies any rectal bleeding, rectal pain.  He denies history of bowel obstruction. ? ? ?Shortness of Breath ?Associated symptoms: chest pain   ? ?  ? ?Home Medications ?Prior to Admission medications   ?Medication Sig Start Date End Date Taking? Authorizing Provider  ?acetaminophen (TYLENOL) 500 MG tablet Take 1,000 mg by mouth every 6 (six) hours as needed for moderate pain.    [provider]  ?albuterol (PROVENTIL) (2.5 MG/3ML) 0.083% nebulizer solution Inhale 3  mLs (2.5 mg total) by nebulization every 4 (four) hours as needed for wheezing or shortness of breath. 08/04/20 08/04/21  Lavina Hamman, MD  ?albuterol (VENTOLIN HFA) 108 (90 Base) MCG/ACT inhaler Inhale 2 puffs into the lungs every 6 (six) hours as needed for wheezing. 06/02/20   [provider]  ?APIXABAN Arne Cleveland) VTE STARTER PACK (10MG  AND 5MG ) Take as directed on package: start with two-5mg  tablets twice daily for 7 days. On day 8, switch to one-5mg  tablet twice daily. 02/04/21   [place] Eric J, DO  ?aspirin 81 MG EC tablet Take 81 mg by mouth daily.    [provider]  ?b complex vitamins capsule Take 1 capsule by mouth daily.    [provider]  ?bisacodyl (DULCOLAX) 5 MG EC tablet Take 1 tablet (5 mg total) by mouth daily at 12 noon. ?Patient taking differently: Take 5 mg by mouth daily. 08/04/20   Lavina Hamman, MD  ?buPROPion (WELLBUTRIN XL) 300 MG 24 hr tablet Take 300 mg by mouth daily. 02/05/20   [provider]  ?busPIRone (BUSPAR) 15 MG tablet Take 15 mg by mouth daily.    [provider]  ?calcium-vitamin D (OSCAL WITH D) 500-200 MG-UNIT tablet Take 1 tablet by mouth 2 (two) times daily with a meal.    [provider]  ?Cholecalciferol (VITAMIN D-3) 25 MCG (1000 UT) CAPS Take 1,000 Units by mouth daily.    [provider]  ?clotrimazole (LOTRIMIN) 1 % cream 1 application daily. for rash 01/25/21  [provider]  ?cyclobenzaprine (FLEXERIL) 10 MG tablet Take 30 mg by mouth at bedtime.    [provider]  ?diltiazem (CARDIZEM CD) 180 MG 24 hr capsule Take 180 mg by mouth daily.    [provider]  ?docusate sodium (COLACE) 100 MG capsule Take 2 capsules (200 mg total) by mouth 2 (two) times daily. ?Patient taking differently: Take 200 mg by mouth daily. 08/04/20   Lavina Hamman, MD  ?DULoxetine (CYMBALTA) 60 MG capsule Take 1 capsule (60 mg total) by mouth 2 (two) times daily. 07/21/17   Lavina Hamman, MD  ?feeding  supplement (ENSURE ENLIVE / ENSURE PLUS) LIQD Take 237 mLs by mouth 3 (three) times daily between meals. ?Patient taking differently: Take 237 mLs by mouth daily. 08/04/20   Lavina Hamman, MD  ?fluticasone Asencion Islam) 50 MCG/ACT nasal spray Place 2 sprays into both nostrils daily.     [provider]  ?folic acid (FOLVITE) 1 MG tablet Take 1 mg by mouth daily.    [provider]  ?gabapentin (NEURONTIN) 300 MG capsule Take 900 mg by mouth at bedtime.    [provider]  ?Garlic Oil 3 MG CAPS Take 1,000 mg by mouth daily.    [provider]  ?lisinopril (ZESTRIL) 20 MG tablet Take 20 mg by mouth daily.    [provider]  ?lurasidone (LATUDA) 40 MG TABS tablet Take 40 mg by mouth daily after supper.    [provider]  ?Melatonin 3 MG TABS Take 6 mg by mouth at bedtime.    [provider]  ?metFORMIN (GLUCOPHAGE) 500 MG tablet Take 500 mg by mouth 2 (two) times daily with a meal.    [provider]  ?methotrexate (RHEUMATREX) 2.5 MG tablet Take 15 mg by mouth once a week. On Saturday- 6 tablets    [provider]  ?Multiple Vitamin (MULTIVITAMIN WITH MINERALS) TABS tablet Take 1 tablet by mouth daily.    [provider]  ?mycophenolate (CELLCEPT) 250 MG capsule Take 1,500 mg by mouth 2 (two) times daily.    [provider]  ?nitroGLYCERIN (NITROSTAT) 0.4 MG SL tablet Place 1 tablet (0.4 mg total) under the tongue every 5 (five) minutes as needed for chest pain. 07/03/19   [place], Suann Larry, MD  ?Olodaterol HCl (STRIVERDI RESPIMAT) 2.5 MCG/ACT AERS Inhale 2 puffs into the lungs daily.    [provider]  ?OVER THE COUNTER MEDICATION Place 1 drop into both ears daily as needed (itching). Miracell ProEar for irritated itchy ears    [provider]  ?OVER THE COUNTER MEDICATION Take 2 tablets by mouth at bedtime. Pure Zzzz    [provider]  ?oxcarbazepine (TRILEPTAL) 600 MG tablet Take 600-900  mg by mouth See admin instructions. Take 600mg  by mouth in the Am and 900mg  ( 1 1/2 tabs) at bedtime 07/16/20   [provider]  ?oxyCODONE-acetaminophen (PERCOCET/ROXICET) 5-325 MG tablet Take 1-2 tablets by mouth every 6 (six) hours as needed for severe pain. 01/20/21   Lacretia Leigh, MD  ?OXYGEN Inhale 3 L into the lungs at bedtime.    [provider]  ?polyethylene glycol (MIRALAX / GLYCOLAX) 17 g packet Take 17 g by mouth daily.    [provider]  ?polyvinyl alcohol (LIQUIFILM TEARS) 1.4 % ophthalmic solution Place 1 drop into both eyes 4 (four) times daily as needed for dry eyes.    [provider]  ?PRESCRIPTION MEDICATION Inhale into the lungs See  admin instructions. CPAP- At bedtime and during during any naps    [provider]  ?prochlorperazine (COMPAZINE) 10 MG tablet Take 1 tablet (10 mg total) by mouth every 6 (six) hours as needed for nausea or vomiting. 53/97/67   Delora Fuel, MD  ?Propylene Glycol (SYSTANE BALANCE) 0.6 % SOLN Place 1 drop into both eyes in the morning, at noon, and at bedtime.     [provider]  ?sodium chloride (OCEAN) 0.65 % SOLN nasal spray Place 2 sprays into both nostrils 4 (four) times daily as needed for congestion.    [provider]  ?tamsulosin (FLOMAX) 0.4 MG CAPS capsule Take 0.4 mg by mouth daily.    [provider]  ?traMADol (ULTRAM) 50 MG tablet Take 100 mg by mouth at bedtime.    [provider]  ?traZODone (DESYREL) 50 MG tablet Take 75 mg by mouth at bedtime.    [provider]  ?VITAMIN A PO Take 1 tablet by mouth daily.    [provider]  ?vitamin C (ASCORBIC ACID) 500 MG tablet Take 500 mg by mouth 2 (two) times daily.     [provider]  ?   ? ?Allergies    ?Demerol [meperidine] and Zocor [simvastatin]   ? ?Review of Systems   ?Review of Systems  ?Respiratory:  Positive for chest tightness and shortness of breath.   ?Cardiovascular:  Positive for  chest pain.  ?All other systems reviewed and are negative. ? ?Physical Exam ?Updated Vital Signs ?BP 140/79   Pulse 96   Temp 98.5 ?F (36.9 ?C) (Oral)   Resp (!) 22   SpO2 92%  ?Physical Exam ?Vitals and

## 2021-05-19 NOTE — H&P (Addendum)
? ? ? ?Date: 05/19/2021     ?     ?     ?Patient Name:  Mario [name] MRN: [id number]  ?DOB: 1950-07-21 Age / Sex: 71 y.o., male   ?PCP: [facility]    ?     ?Medical Service: Internal Medicine Teaching Service    ?     ?Attending Physician: Dr. Joni Reining     ?First Contact: Dr. Raymondo Band Pager: [phone]  ?Second Contact: Dr. Coy Saunas Pager: [phone]  ?     ?After Hours (After 5p/  First Contact Pager: [phone]  ?weekends / holidays): Second Contact Pager: [phone]  ? ?Chief Complaint: Shortness of breath ? ? ?History of Present Illness:  ?Patient is a 71 year old male with past medical hx of COPD, [place] lung cancer stage IIb in 06/2020 s/p radiation therapy, PE in 2023, OSA on CPAP, HTN, chronic back pain, hx of ankylosing spondylitis, anxiety, and depression who is presenting to the ED with shortness of breath. Shortness of breath worsening for 2 weeks. He is on 3 L [place] at home.  About 2 weeks ago, his dependence on oxygen increased.  He was seen by his pulmonary doctor about 2 weeks ago but at that time his shortness of breath was not this bad. Patient uses 3L at home when moving but does not when he is at rest. He states without it, he gets short of breath while ambulating to the kitchen.  He was given a new inhaler and has been using it. He was diagnosed with PE six weeks ago and was started on Eliquis to which he endorses adherence.  ? ?He endorsed compliance with his CPAP and said he does not sleep without it.  On review of systems he is denying any fevers or chills, any chest pain.  He is denying orthopnea and sleeps on 1 pillow.  He is endorsing increased fatigue, dry cough, and constipation.  For his constipation he states he has been taking MiraLAX and Dulcolax for 8 months and his bowel movements are once to twice daily but he has only had 1 bowel movement in the past week.  He had a small bowel movement couple days ago that was difficult to pass.  He is denying any sick contacts except stating  that his roommate was sick but unable to tell me the symptoms of the roommate.  When inquiring about other contacts he states he does get food deliveries to his apartment and seldom leaves the apartment. ? ?He has multiple sub-specialists and follows with them regularly.  He also follows with rheumatology, pulmonology, and neurology and has seen all of them this month.  All of them are part of the [place] network.  He last saw his oncologist in September and is supposed to see them next month. ? ? ?Review of Systems: Negative unless stated in the HPI. ? ?Past Medical History:  ?Diagnosis Date  ? Anxiety   ? Bronchitis   ? COPD (chronic obstructive pulmonary disease) ([place])   ? Depression   ? History of radiation therapy   ? Left lung- 10/05/20-10/15/20- Dr. Gery Pray  ? Hypertension   ? lung ca 09/2020  ? Mental disorder   ? MI (myocardial infarction) ([place])   ? ????  ? OSA (obstructive sleep apnea)   ? Suicide attempt [facility])   ? Tension pneumothorax 06/27/2016  ? Uveitis   ? ? ?Meds:  ?Current Outpatient Medications  ?Medication Instructions  ? acetaminophen (TYLENOL) 1,000 mg,  Oral, Every 6 hours PRN  ? albuterol (PROVENTIL) (2.5 MG/3ML) 0.083% nebulizer solution Inhale 3 mLs (2.5 mg total) by nebulization every 4 (four) hours as needed for wheezing or shortness of breath.  ? albuterol (VENTOLIN HFA) 108 (90 Base) MCG/ACT inhaler 2 puffs, Inhalation, Every 6 hours PRN  ? APIXABAN (ELIQUIS) VTE STARTER PACK (10MG  AND 5MG ) Take as directed on package: start with two-5mg  tablets twice daily for 7 days. On day 8, switch to one-5mg  tablet twice daily.  ? aspirin 81 mg, Oral, Daily  ? b complex vitamins capsule 1 capsule, Oral, Daily  ? bisacodyl (DULCOLAX) 5 mg, Oral, Daily  ? buPROPion (WELLBUTRIN XL) 300 mg, Oral, Daily  ? busPIRone (BUSPAR) 15 mg, Oral, Daily  ? calcium-vitamin D (OSCAL WITH D) 500-200 MG-UNIT tablet 1 tablet, Oral, 2 times daily with meals  ? clotrimazole (LOTRIMIN) 1 % cream 1 application., Daily, for  rash  ? cyclobenzaprine (FLEXERIL) 30 mg, Oral, Daily at bedtime  ? diltiazem (CARDIZEM CD) 180 mg, Oral, Daily  ? docusate sodium (COLACE) 200 mg, Oral, 2 times daily  ? DULoxetine (CYMBALTA) 60 mg, Oral, 2 times daily  ? feeding supplement (ENSURE ENLIVE / ENSURE PLUS) LIQD 237 mLs, Oral, 3 times daily between meals  ? fluticasone (FLONASE) 50 MCG/ACT nasal spray 2 sprays, Each Nare, Daily  ? folic acid (FOLVITE) 1 mg, Oral, Daily  ? gabapentin (NEURONTIN) 900 mg, Oral, Daily at bedtime  ? Garlic Oil 4,401 mg, Oral, Daily  ? lisinopril (ZESTRIL) 20 mg, Oral, Daily  ? lurasidone (LATUDA) 40 mg, Oral, Daily after supper  ? melatonin 6 mg, Oral, Daily at bedtime  ? metFORMIN (GLUCOPHAGE) 500 mg, Oral, 2 times daily with meals  ? methotrexate (RHEUMATREX) 15 mg, Oral, Weekly, On Saturday- 6 tablets  ? Multiple Vitamin (MULTIVITAMIN WITH MINERALS) TABS tablet 1 tablet, Oral, Daily  ? mycophenolate (CELLCEPT) 1,500 mg, Oral, 2 times daily  ? nitroGLYCERIN (NITROSTAT) 0.4 mg, Sublingual, Every 5 min PRN  ? Olodaterol HCl (STRIVERDI RESPIMAT) 2.5 MCG/ACT AERS 2 puffs, Inhalation, Daily  ? OVER THE COUNTER MEDICATION 1 drop, Both EARS, Daily PRN, Miracell ProEar for irritated itchy ears  ? OVER THE COUNTER MEDICATION 2 tablets, Oral, Daily at bedtime, Pure Zzzz  ? oxcarbazepine (TRILEPTAL) 600-900 mg, Oral, See admin instructions, Take 600mg  by mouth in the Am and 900mg  ( 1 1/2 tabs) at bedtime  ? oxyCODONE-acetaminophen (PERCOCET/ROXICET) 5-325 MG tablet 1-2 tablets, Oral, Every 6 hours PRN  ? OXYGEN 3 L, Inhalation, Daily at bedtime  ? polyethylene glycol (MIRALAX / GLYCOLAX) 17 g, Oral, Daily  ? polyvinyl alcohol (LIQUIFILM TEARS) 1.4 % ophthalmic solution 1 drop, Both Eyes, 4 times daily PRN  ? PRESCRIPTION MEDICATION Inhalation, See admin instructions, CPAP- At bedtime and during during any naps   ? prochlorperazine (COMPAZINE) 10 mg, Oral, Every 6 hours PRN  ? Propylene Glycol (SYSTANE BALANCE) 0.6 % SOLN 1 drop,  Both Eyes, 3 times daily  ? sodium chloride (OCEAN) 0.65 % SOLN nasal spray 2 sprays, Each Nare, 4 times daily PRN  ? tamsulosin (FLOMAX) 0.4 mg, Oral, Daily  ? traMADol (ULTRAM) 100 mg, Oral, Daily at bedtime  ? traZODone (DESYREL) 75 mg, Oral, Daily at bedtime  ? VITAMIN A PO 1 tablet, Oral, Daily  ? vitamin C (ASCORBIC ACID) 500 mg, Oral, 2 times daily  ? Vitamin D-3 1,000 Units, Oral, Daily  ?  ? ? ?Allergies: ?Allergies as of 05/19/2021 - Review Complete 02/05/2021  ?Allergen Reaction Noted  ?  Demerol [meperidine] Nausea And Vomiting and Other (See Comments) 01/09/2013  ? Zocor [simvastatin] Nausea And Vomiting and Other (See Comments) 01/09/2013  ? ? ?Family History:  ?Family History  ?Problem Relation Age of Onset  ? Dementia Father   ? ? ?Social History: Lives with room mate. No pets. Not currently smoking, no alcohol or illicit substance use. Able to perform ADLs but needs help iADLs and room mate helps.  ?Social History  ? ?Socioeconomic History  ? Marital status: Single  ?  Spouse name: Not on file  ? Number of children: Not on file  ? Years of education: Not on file  ? Highest education level: Not on file  ?Occupational History  ? Occupation: retired  ?Tobacco Use  ? Smoking status: Former  ?  Packs/day: 1.00  ?  Years: 35.00  ?  Pack years: 35.00  ?  Types: Cigarettes  ?  Quit date: 05/2016  ?  Years since quitting: 4.9  ? Smokeless tobacco: Never  ?Vaping Use  ? Vaping Use: Never used  ?Substance and Sexual Activity  ? Alcohol use: No  ?  Alcohol/week: 0.0 standard drinks  ?  Comment: denies use of any drugs or alcohol  ? Drug use: No  ? Sexual activity: Not on file  ?Other Topics Concern  ? Not on file  ?Social History Narrative  ? Not on file  ? ?Social Determinants of Health  ? ?Financial Resource Strain: Low Risk   ? Difficulty of Paying Living Expenses: Not hard at all  ?Food Insecurity: Not on file  ?Transportation Needs: No Transportation Needs  ? Lack of Transportation (Medical): No  ? Lack of  Transportation (Non-Medical): No  ?Physical Activity: Not on file  ?Stress: No Stress Concern Present  ? Feeling of Stress : Not at all  ?Social Connections: Not on file  ?Intimate Partner Violence: Not

## 2021-05-19 NOTE — ED Notes (Signed)
Pt pulled IV accidentally while moving. ?

## 2021-05-20 DIAGNOSIS — Z85118 Personal history of other malignant neoplasm of bronchus and lung: Secondary | ICD-10-CM | POA: Diagnosis not present

## 2021-05-20 DIAGNOSIS — Z9981 Dependence on supplemental oxygen: Secondary | ICD-10-CM | POA: Diagnosis not present

## 2021-05-20 DIAGNOSIS — C3492 Malignant neoplasm of unspecified part of left bronchus or lung: Secondary | ICD-10-CM | POA: Diagnosis not present

## 2021-05-20 DIAGNOSIS — Z7960 Long term (current) use of unspecified immunomodulators and immunosuppressants: Secondary | ICD-10-CM | POA: Diagnosis not present

## 2021-05-20 DIAGNOSIS — J431 Panlobular emphysema: Secondary | ICD-10-CM

## 2021-05-20 DIAGNOSIS — H209 Unspecified iridocyclitis: Secondary | ICD-10-CM | POA: Diagnosis present

## 2021-05-20 DIAGNOSIS — M549 Dorsalgia, unspecified: Secondary | ICD-10-CM | POA: Diagnosis present

## 2021-05-20 DIAGNOSIS — Z7984 Long term (current) use of oral hypoglycemic drugs: Secondary | ICD-10-CM | POA: Diagnosis not present

## 2021-05-20 DIAGNOSIS — M459 Ankylosing spondylitis of unspecified sites in spine: Secondary | ICD-10-CM | POA: Diagnosis present

## 2021-05-20 DIAGNOSIS — Z87891 Personal history of nicotine dependence: Secondary | ICD-10-CM | POA: Diagnosis not present

## 2021-05-20 DIAGNOSIS — I10 Essential (primary) hypertension: Secondary | ICD-10-CM | POA: Diagnosis present

## 2021-05-20 DIAGNOSIS — Z7901 Long term (current) use of anticoagulants: Secondary | ICD-10-CM | POA: Diagnosis not present

## 2021-05-20 DIAGNOSIS — N400 Enlarged prostate without lower urinary tract symptoms: Secondary | ICD-10-CM | POA: Diagnosis present

## 2021-05-20 DIAGNOSIS — F5105 Insomnia due to other mental disorder: Secondary | ICD-10-CM | POA: Diagnosis present

## 2021-05-20 DIAGNOSIS — J9621 Acute and chronic respiratory failure with hypoxia: Secondary | ICD-10-CM

## 2021-05-20 DIAGNOSIS — J189 Pneumonia, unspecified organism: Secondary | ICD-10-CM

## 2021-05-20 DIAGNOSIS — F419 Anxiety disorder, unspecified: Secondary | ICD-10-CM | POA: Diagnosis present

## 2021-05-20 DIAGNOSIS — E871 Hypo-osmolality and hyponatremia: Secondary | ICD-10-CM | POA: Diagnosis present

## 2021-05-20 DIAGNOSIS — R0602 Shortness of breath: Secondary | ICD-10-CM | POA: Diagnosis present

## 2021-05-20 DIAGNOSIS — F32A Depression, unspecified: Secondary | ICD-10-CM | POA: Diagnosis present

## 2021-05-20 DIAGNOSIS — J440 Chronic obstructive pulmonary disease with acute lower respiratory infection: Secondary | ICD-10-CM | POA: Diagnosis present

## 2021-05-20 DIAGNOSIS — Z20822 Contact with and (suspected) exposure to covid-19: Secondary | ICD-10-CM | POA: Diagnosis present

## 2021-05-20 DIAGNOSIS — Z79899 Other long term (current) drug therapy: Secondary | ICD-10-CM | POA: Diagnosis not present

## 2021-05-20 DIAGNOSIS — Z885 Allergy status to narcotic agent status: Secondary | ICD-10-CM | POA: Diagnosis not present

## 2021-05-20 DIAGNOSIS — L304 Erythema intertrigo: Secondary | ICD-10-CM | POA: Diagnosis present

## 2021-05-20 DIAGNOSIS — J441 Chronic obstructive pulmonary disease with (acute) exacerbation: Secondary | ICD-10-CM | POA: Diagnosis not present

## 2021-05-20 DIAGNOSIS — R000 Tachycardia, unspecified: Secondary | ICD-10-CM | POA: Diagnosis present

## 2021-05-20 DIAGNOSIS — J9601 Acute respiratory failure with hypoxia: Secondary | ICD-10-CM | POA: Diagnosis not present

## 2021-05-20 DIAGNOSIS — E119 Type 2 diabetes mellitus without complications: Secondary | ICD-10-CM | POA: Diagnosis present

## 2021-05-20 DIAGNOSIS — G8929 Other chronic pain: Secondary | ICD-10-CM | POA: Diagnosis present

## 2021-05-20 DIAGNOSIS — Z7982 Long term (current) use of aspirin: Secondary | ICD-10-CM | POA: Diagnosis not present

## 2021-05-20 DIAGNOSIS — Z86711 Personal history of pulmonary embolism: Secondary | ICD-10-CM | POA: Diagnosis not present

## 2021-05-20 LAB — CBC WITH DIFFERENTIAL/PLATELET
Abs Immature Granulocytes: 0.1 10*3/uL — ABNORMAL HIGH (ref 0.00–0.07)
Basophils Absolute: 0 10*3/uL (ref 0.0–0.1)
Basophils Relative: 0 %
Eosinophils Absolute: 0 10*3/uL (ref 0.0–0.5)
Eosinophils Relative: 0 %
HCT: 40.4 % (ref 39.0–52.0)
Hemoglobin: 13.8 g/dL (ref 13.0–17.0)
Immature Granulocytes: 1 %
Lymphocytes Relative: 7 %
Lymphs Abs: 0.7 10*3/uL (ref 0.7–4.0)
MCH: 32.7 pg (ref 26.0–34.0)
MCHC: 34.2 g/dL (ref 30.0–36.0)
MCV: 95.7 fL (ref 80.0–100.0)
Monocytes Absolute: 0.2 10*3/uL (ref 0.1–1.0)
Monocytes Relative: 2 %
Neutro Abs: 8.8 10*3/uL — ABNORMAL HIGH (ref 1.7–7.7)
Neutrophils Relative %: 90 %
Platelets: 329 10*3/uL (ref 150–400)
RBC: 4.22 MIL/uL (ref 4.22–5.81)
RDW: 14.1 % (ref 11.5–15.5)
WBC: 9.8 10*3/uL (ref 4.0–10.5)
nRBC: 0 % (ref 0.0–0.2)

## 2021-05-20 LAB — BASIC METABOLIC PANEL
Anion gap: 12 (ref 5–15)
BUN: 12 mg/dL (ref 8–23)
CO2: 23 mmol/L (ref 22–32)
Calcium: 9 mg/dL (ref 8.9–10.3)
Chloride: 97 mmol/L — ABNORMAL LOW (ref 98–111)
Creatinine, Ser: 1.12 mg/dL (ref 0.61–1.24)
GFR, Estimated: >60 mL/min (ref >60)
Glucose, Bld: 252 mg/dL — ABNORMAL HIGH (ref 70–99)
Potassium: 4.8 mmol/L (ref 3.5–5.1)
Sodium: 132 mmol/L — ABNORMAL LOW (ref 135–145)

## 2021-05-20 LAB — GLUCOSE, CAPILLARY
Glucose-Capillary: 252 mg/dL — ABNORMAL HIGH (ref 70–99)
Glucose-Capillary: 223 mg/dL — ABNORMAL HIGH (ref 70–99)

## 2021-05-20 LAB — CBG MONITORING, ED: Glucose-Capillary: 237 mg/dL — ABNORMAL HIGH (ref 70–99)

## 2021-05-20 MED ORDER — B COMPLEX-C PO TABS
1.0000 | ORAL_TABLET | Freq: Every day | ORAL | Status: DC
  Administered 2021-05-20 – 2021-05-21 (×2): 1 via ORAL
  Filled 2021-05-20 (×3): qty 1

## 2021-05-20 MED ORDER — SENNOSIDES-DOCUSATE SODIUM 8.6-50 MG PO TABS
1.0000 | ORAL_TABLET | Freq: Two times a day (BID) | ORAL | Status: DC
  Administered 2021-05-20 – 2021-05-21 (×3): 1 via ORAL
  Filled 2021-05-20 (×3): qty 1

## 2021-05-20 MED ORDER — INSULIN ASPART 100 UNIT/ML IJ SOLN
0.0000 [IU] | Freq: Three times a day (TID) | INTRAMUSCULAR | Status: DC
  Administered 2021-05-20: 5 [IU] via SUBCUTANEOUS
  Administered 2021-05-21 (×2): 3 [IU] via SUBCUTANEOUS
  Administered 2021-05-21: 8 [IU] via SUBCUTANEOUS

## 2021-05-20 MED ORDER — ASPIRIN EC 81 MG PO TBEC
81.0000 mg | DELAYED_RELEASE_TABLET | Freq: Every day | ORAL | Status: DC
  Administered 2021-05-20 – 2021-05-21 (×2): 81 mg via ORAL
  Filled 2021-05-20 (×2): qty 1

## 2021-05-20 MED ORDER — INSULIN ASPART 100 UNIT/ML IJ SOLN
0.0000 [IU] | Freq: Every day | INTRAMUSCULAR | Status: DC
  Administered 2021-05-20: 2 [IU] via SUBCUTANEOUS

## 2021-05-20 MED ORDER — TRAMADOL HCL 50 MG PO TABS
50.0000 mg | ORAL_TABLET | Freq: Two times a day (BID) | ORAL | Status: DC
  Administered 2021-05-20: 50 mg via ORAL
  Filled 2021-05-20 (×3): qty 1

## 2021-05-20 MED ORDER — ENSURE ENLIVE PO LIQD
237.0000 mL | Freq: Three times a day (TID) | ORAL | Status: DC
  Administered 2021-05-20 – 2021-05-21 (×4): 237 mL via ORAL
  Filled 2021-05-20: qty 237

## 2021-05-20 MED ORDER — ADULT MULTIVITAMIN W/MINERALS CH
1.0000 | ORAL_TABLET | Freq: Every day | ORAL | Status: DC
  Administered 2021-05-20 – 2021-05-21 (×2): 1 via ORAL
  Filled 2021-05-20 (×2): qty 1

## 2021-05-20 MED ORDER — FOLIC ACID 1 MG PO TABS
1.0000 mg | ORAL_TABLET | Freq: Every day | ORAL | Status: DC
  Administered 2021-05-20 – 2021-05-21 (×2): 1 mg via ORAL
  Filled 2021-05-20 (×2): qty 1

## 2021-05-20 MED ORDER — METHOTREXATE 2.5 MG PO TABS
15.0000 mg | ORAL_TABLET | ORAL | Status: DC
  Filled 2021-05-20: qty 6

## 2021-05-20 MED ORDER — BUSPIRONE HCL 15 MG PO TABS
15.0000 mg | ORAL_TABLET | Freq: Every day | ORAL | Status: DC
  Administered 2021-05-20 – 2021-05-21 (×2): 15 mg via ORAL
  Filled 2021-05-20: qty 2
  Filled 2021-05-20: qty 1

## 2021-05-20 MED ORDER — POLYVINYL ALCOHOL 1.4 % OP SOLN
1.0000 [drp] | Freq: Three times a day (TID) | OPHTHALMIC | Status: DC
  Administered 2021-05-20 – 2021-05-21 (×4): 1 [drp] via OPHTHALMIC
  Filled 2021-05-20: qty 15

## 2021-05-20 MED ORDER — DULOXETINE HCL 60 MG PO CPEP
60.0000 mg | ORAL_CAPSULE | Freq: Two times a day (BID) | ORAL | Status: DC
  Administered 2021-05-20 – 2021-05-21 (×3): 60 mg via ORAL
  Filled 2021-05-20 (×4): qty 1

## 2021-05-20 MED ORDER — MYCOPHENOLATE MOFETIL 250 MG PO CAPS
1500.0000 mg | ORAL_CAPSULE | Freq: Two times a day (BID) | ORAL | Status: DC
  Administered 2021-05-20 – 2021-05-21 (×2): 1500 mg via ORAL
  Filled 2021-05-20 (×4): qty 6

## 2021-05-20 MED ORDER — ACETAMINOPHEN 650 MG RE SUPP: 650.0000 mg | Freq: Four times a day (QID) | RECTAL | Status: DC | PRN

## 2021-05-20 MED ORDER — DILTIAZEM HCL ER COATED BEADS 180 MG PO CP24
180.0000 mg | ORAL_CAPSULE | Freq: Every day | ORAL | Status: DC
Start: 2021-05-20 — End: 2021-05-21
  Administered 2021-05-20 – 2021-05-21 (×2): 180 mg via ORAL
  Filled 2021-05-20 (×3): qty 1

## 2021-05-20 MED ORDER — BUSPIRONE HCL 10 MG PO TABS: 15.0000 mg | ORAL_TABLET | Freq: Every day | ORAL | Status: DC

## 2021-05-20 MED ORDER — AZITHROMYCIN 250 MG PO TABS
500.0000 mg | ORAL_TABLET | Freq: Every day | ORAL | Status: DC
  Administered 2021-05-20 – 2021-05-21 (×2): 500 mg via ORAL
  Filled 2021-05-20 (×2): qty 2

## 2021-05-20 MED ORDER — OXCARBAZEPINE 300 MG PO TABS
600.0000 mg | ORAL_TABLET | Freq: Two times a day (BID) | ORAL | Status: DC
  Administered 2021-05-20 – 2021-05-21 (×3): 600 mg via ORAL
  Filled 2021-05-20 (×5): qty 2

## 2021-05-20 MED ORDER — GUAIFENESIN ER 600 MG PO TB12
600.0000 mg | ORAL_TABLET | Freq: Two times a day (BID) | ORAL | Status: DC
  Administered 2021-05-20 – 2021-05-21 (×3): 600 mg via ORAL
  Filled 2021-05-20 (×3): qty 1

## 2021-05-20 MED ORDER — ALBUTEROL SULFATE (2.5 MG/3ML) 0.083% IN NEBU: 2.5000 mg | INHALATION_SOLUTION | Freq: Four times a day (QID) | RESPIRATORY_TRACT | Status: DC | PRN

## 2021-05-20 MED ORDER — LISINOPRIL 20 MG PO TABS
20.0000 mg | ORAL_TABLET | Freq: Every day | ORAL | Status: DC
  Administered 2021-05-20 – 2021-05-21 (×2): 20 mg via ORAL
  Filled 2021-05-20 (×2): qty 1

## 2021-05-20 MED ORDER — POLYETHYLENE GLYCOL 3350 17 G PO PACK
17.0000 g | PACK | Freq: Every day | ORAL | Status: DC
  Administered 2021-05-20 – 2021-05-21 (×2): 17 g via ORAL
  Filled 2021-05-20 (×2): qty 1

## 2021-05-20 MED ORDER — GABAPENTIN 300 MG PO CAPS
900.0000 mg | ORAL_CAPSULE | Freq: Every day | ORAL | Status: DC
  Administered 2021-05-20 (×2): 900 mg via ORAL
  Filled 2021-05-20 (×2): qty 3

## 2021-05-20 MED ORDER — SALINE SPRAY 0.65 % NA SOLN: 2.0000 | Freq: Four times a day (QID) | NASAL | Status: DC | PRN

## 2021-05-20 MED ORDER — AZITHROMYCIN 250 MG PO TABS: 500.0000 mg | ORAL_TABLET | Freq: Every day | ORAL | Status: DC

## 2021-05-20 MED ORDER — OXCARBAZEPINE 300 MG PO TABS
900.0000 mg | ORAL_TABLET | Freq: Every day | ORAL | Status: DC
  Administered 2021-05-20 (×2): 900 mg via ORAL
  Filled 2021-05-20 (×3): qty 3

## 2021-05-20 MED ORDER — APIXABAN 5 MG PO TABS
5.0000 mg | ORAL_TABLET | Freq: Two times a day (BID) | ORAL | Status: DC
Start: 2021-05-20 — End: 2021-05-21
  Administered 2021-05-20 – 2021-05-21 (×3): 5 mg via ORAL
  Filled 2021-05-20 (×3): qty 1

## 2021-05-20 MED ORDER — CYCLOBENZAPRINE HCL 10 MG PO TABS
30.0000 mg | ORAL_TABLET | Freq: Every day | ORAL | Status: DC
  Administered 2021-05-20 (×2): 30 mg via ORAL
  Filled 2021-05-20 (×2): qty 3

## 2021-05-20 MED ORDER — ARFORMOTEROL TARTRATE 15 MCG/2ML IN NEBU
15.0000 ug | INHALATION_SOLUTION | Freq: Two times a day (BID) | RESPIRATORY_TRACT | Status: DC
Start: 2021-05-20 — End: 2021-05-21
  Administered 2021-05-20 – 2021-05-21 (×3): 15 ug via RESPIRATORY_TRACT
  Filled 2021-05-20 (×3): qty 2

## 2021-05-20 MED ORDER — BUPROPION HCL ER (XL) 150 MG PO TB24
300.0000 mg | ORAL_TABLET | Freq: Every day | ORAL | Status: DC
  Administered 2021-05-20 – 2021-05-21 (×2): 300 mg via ORAL
  Filled 2021-05-20: qty 2
  Filled 2021-05-20 (×2): qty 1

## 2021-05-20 MED ORDER — ASCORBIC ACID 500 MG PO TABS
500.0000 mg | ORAL_TABLET | Freq: Two times a day (BID) | ORAL | Status: DC
  Administered 2021-05-20 – 2021-05-21 (×3): 500 mg via ORAL
  Filled 2021-05-20 (×3): qty 1

## 2021-05-20 MED ORDER — VITAMIN D 25 MCG (1000 UNIT) PO TABS
1000.0000 [IU] | ORAL_TABLET | Freq: Every day | ORAL | Status: DC
  Administered 2021-05-20 – 2021-05-21 (×2): 1000 [IU] via ORAL
  Filled 2021-05-20 (×2): qty 1

## 2021-05-20 MED ORDER — OXYCODONE-ACETAMINOPHEN 5-325 MG PO TABS
1.0000 | ORAL_TABLET | Freq: Once | ORAL | Status: AC
Start: 2021-05-20 — End: 2021-05-20
  Administered 2021-05-20: 1 via ORAL
  Filled 2021-05-20: qty 1

## 2021-05-20 MED ORDER — TRAZODONE HCL 150 MG PO TABS
75.0000 mg | ORAL_TABLET | Freq: Every day | ORAL | Status: DC
  Administered 2021-05-20 (×2): 75 mg via ORAL
  Filled 2021-05-20: qty 1
  Filled 2021-05-20: qty 2

## 2021-05-20 MED ORDER — METFORMIN HCL 500 MG PO TABS
500.0000 mg | ORAL_TABLET | Freq: Two times a day (BID) | ORAL | Status: DC
Start: 2021-05-21 — End: 2021-05-21
  Administered 2021-05-21: 500 mg via ORAL
  Filled 2021-05-20: qty 1

## 2021-05-20 MED ORDER — ACETAMINOPHEN 325 MG PO TABS
650.0000 mg | ORAL_TABLET | Freq: Four times a day (QID) | ORAL | Status: DC | PRN
  Administered 2021-05-20 (×2): 650 mg via ORAL
  Filled 2021-05-20 (×2): qty 2

## 2021-05-20 MED ORDER — PREDNISONE 20 MG PO TABS
20.0000 mg | ORAL_TABLET | Freq: Every day | ORAL | Status: DC
  Administered 2021-05-20 – 2021-05-21 (×2): 20 mg via ORAL
  Filled 2021-05-20 (×2): qty 1

## 2021-05-20 MED ORDER — AMOXICILLIN-POT CLAVULANATE 875-125 MG PO TABS
1.0000 | ORAL_TABLET | Freq: Two times a day (BID) | ORAL | Status: DC
  Administered 2021-05-21: 1 via ORAL
  Filled 2021-05-20: qty 1

## 2021-05-20 MED ORDER — TAMSULOSIN HCL 0.4 MG PO CAPS
0.4000 mg | ORAL_CAPSULE | Freq: Every day | ORAL | Status: DC
  Administered 2021-05-20 – 2021-05-21 (×2): 0.4 mg via ORAL
  Filled 2021-05-20 (×2): qty 1

## 2021-05-20 MED ORDER — POLYVINYL ALCOHOL 1.4 % OP SOLN: 1.0000 [drp] | Freq: Four times a day (QID) | OPHTHALMIC | Status: DC | PRN

## 2021-05-20 MED ORDER — MELATONIN 3 MG PO TABS
6.0000 mg | ORAL_TABLET | Freq: Every day | ORAL | Status: DC
  Administered 2021-05-20 (×2): 6 mg via ORAL
  Filled 2021-05-20 (×2): qty 2

## 2021-05-20 MED ORDER — METFORMIN HCL 500 MG PO TABS
500.0000 mg | ORAL_TABLET | Freq: Two times a day (BID) | ORAL | Status: DC
Start: 2021-05-20 — End: 2021-05-20

## 2021-05-20 MED ORDER — FLUTICASONE PROPIONATE 50 MCG/ACT NA SUSP
2.0000 | Freq: Every day | NASAL | Status: DC
  Administered 2021-05-20 – 2021-05-21 (×2): 2 via NASAL
  Filled 2021-05-20: qty 16

## 2021-05-20 MED ORDER — LURASIDONE HCL 40 MG PO TABS
40.0000 mg | ORAL_TABLET | Freq: Every day | ORAL | Status: DC
  Administered 2021-05-20 – 2021-05-21 (×2): 40 mg via ORAL
  Filled 2021-05-20 (×2): qty 1

## 2021-05-20 MED ORDER — ALBUTEROL SULFATE (2.5 MG/3ML) 0.083% IN NEBU: 2.5000 mg | INHALATION_SOLUTION | RESPIRATORY_TRACT | Status: DC | PRN

## 2021-05-20 MED ORDER — OYSTER SHELL CALCIUM/D3 500-5 MG-MCG PO TABS
1.0000 | ORAL_TABLET | Freq: Two times a day (BID) | ORAL | Status: DC
  Administered 2021-05-20 – 2021-05-21 (×3): 1 via ORAL
  Filled 2021-05-20 (×4): qty 1

## 2021-05-20 MED ORDER — OXCARBAZEPINE 300 MG PO TABS: 600.0000 mg | ORAL_TABLET | ORAL | Status: DC

## 2021-05-20 MED ORDER — IPRATROPIUM-ALBUTEROL 0.5-2.5 (3) MG/3ML IN SOLN
3.0000 mL | Freq: Four times a day (QID) | RESPIRATORY_TRACT | Status: DC | PRN
  Filled 2021-05-20: qty 3

## 2021-05-20 MED ORDER — PROCHLORPERAZINE MALEATE 10 MG PO TABS
10.0000 mg | ORAL_TABLET | Freq: Four times a day (QID) | ORAL | Status: DC | PRN
  Administered 2021-05-20 – 2021-05-21 (×4): 10 mg via ORAL
  Filled 2021-05-20 (×2): qty 1
  Filled 2021-05-20 (×2): qty 2
  Filled 2021-05-20: qty 1

## 2021-05-20 NOTE — ED Notes (Signed)
Pt c/o nagging HA with some nausea.  ?

## 2021-05-20 NOTE — Progress Notes (Signed)
Mario [name] is a 71 y.o. male patient admitted. Awake, alert - oriented  X 4 - no acute distress noted.  VSS - Blood pressure 136/79, pulse (!) 103, temperature 97.8 ?F (36.6 ?C), resp. rate 19, SpO2 93 %.    IV in place, occlusive dsg intact without redness. ? ?Orientation to room, and floor completed.  Admission INP armband ID verified with patient/family, and in place.   ?SR up x 2, fall assessment complete, with patient and family able to verbalize understanding of risk associated with falls, and verbalized understanding to call nsg before up out of bed.  Call light within reach, patient able to voice, and demonstrate understanding. ?No evidence of skin break down noted on exam. ? ?  ? ? ?Will cont to eval and treat per MD orders. ? ?Hezzie Bump, RN ?05/20/2021 8:29 PM  ?

## 2021-05-20 NOTE — TOC Progression Note (Signed)
Transition of Care (TOC) - Progression Note  ? ? ?Patient Details  ?Name: Mario [name] ?MRN: [id number] ?Date of Birth: June 18, 1950 ? ?Transition of Care (TOC) CM/SW Contact  ?Marilu Favre, RN ?Phone Number: ?05/20/2021, 3:51 PM ? ?Clinical Narrative:    ? ?Patient from home with home oxygen. Currently requiring increased liter flow.  ? ?Oxygen agency may need updated oxygen orders at discharge  ? ? ?Transition of Care (TOC) Screening Note ? ? ?Patient Details  ?Name: Mario [name] ?Date of Birth: 02-12-1951 ? ? ? ? ?Transition of Care Department [facility]) has reviewed patient and  will continue to monitor patient advancement through interdisciplinary progression rounds. If new patient transition needs arise, please place a TOC consult. ?  ?  ?  ? ?Expected Discharge Plan and Services ?  ?  ?  ?  ?  ?                ?  ?  ?  ?  ?  ?  ?  ?  ?  ?  ? ? ?Social Determinants of Health (SDOH) Interventions ?  ? ?Readmission Risk Interventions ?   ? View : No data to display.  ?  ?  ?  ? ? ?

## 2021-05-20 NOTE — Progress Notes (Signed)
Pt c/o chest pain 3/10, pressure on his chest "like an elephant sitting on my chest". BP 129/78, P 91, RR 20, O2sats 93% in 4li/[place]. Also refuses to have soap suds enema tonight. On call Md made aware, and came up to the floor to see the pt. ?

## 2021-05-20 NOTE — Progress Notes (Addendum)
? ? ?SUBJECTIVE:  ?Patient Summary: Mario [name] is a 71 y.o. male with past medical history of COPD, [place] lung cancer stage IIb in 06/2020 s/p radiation therapy, PE in 2023, OSA on CPAP, HTN, chronic back pain, hx of ankylosing spondylitis, anxiety, and depression, T2DM, who is presenting to the ED with shortness of breath ? ?Overnight Events: None ? ?Interim History: Patient feels his breathing has improved since admission but is still not completely at baseline. He continues to feel concerned about constipation over the alst week despite home Miralax and docusate use; he states that in past hospitalizations a soap sud enema has helped relieve his constipation. At home he says that his oxygen saturation at rest ranges 88-91% and is comfortable, and that his heart rate reads at around 110. He mostly uses oxygen at home when ambulatory and does not use it at rest. ? ?OBJECTIVE:  ?Vital Signs: ?Vitals:  ? 05/20/21 0315 05/20/21 0430 05/20/21 0500 05/20/21 0629  ?BP: (!) 152/75 117/88 124/79 115/66  ?Pulse: (!) 104 (!) 103 (!) 101 (!) 105  ?Resp:   (!) 23 20  ?Temp:      ?TempSrc:      ?SpO2: 93% 90% 93% 92%  ? ?Supplemental O2: Nasal Cannula ?SpO2: 92 % ?O2 Flow Rate (L/min): 5 L/min ? ?There were no vitals filed for this visit. ? ?No intake or output data in the 24 hours ending 05/20/21 0707 ?Net IO Since Admission: No IO data has been entered for this period [05/20/21 0707] ? ?Physical Exam: ?Constitutional:Chronically-ill appearing gentleman sitting on side of bed. No acute distress. ?Cardio:Tachycardic with regular rhythm.  ?Pulm: O2 saturations >95% on 5L [place]. ?Skin:Warm and dry. ?Neuro:Alert and oriented x3. No focal deficit noted. ?Psych:Normal mood and affect. ? ?Patient Lines/Drains/Airways Status   ? ? Active Line/Drains/Airways   ? ? Name Placement date Placement time Site Days  ? Peripheral IV 05/19/21 18 G 1" Anterior;Distal;Right;Upper Antecubital 05/19/21  2238  Antecubital  1  ? ?  ?  ? ?   ? ? ? ?ASSESSMENT/PLAN:  ?Assessment: ?Principal Problem: ?  Acute on chronic respiratory failure ([place]) ?Active Problems: ?  COPD (chronic obstructive pulmonary disease) ([place]) ?  CAP (community acquired pneumonia) ?  Constipation ?  Non-small cell cancer of left lung ([place]) ? ? ?Plan: ?#Acute on Chronic Hypoxic Respiratory Failure ?#COPD vs Community Acquired Pneumonia ?#Hx of [place] Lung Cancer Stage II s/p radiation ?Patient reported using home oxygen while ambulating, 3L [place], and none while at rest. He gets short of breath when going from seated to standing. At home when he measures his oxygen levels it ranges 88-91% and he feels comfortable; on that device his heart rate is typically elevated to around 110 as well. He feels that he has had improvement of his breathing with breathing treatments and no longer feels that he is wheezing. We did have staff ambulate him and during that time his SpO2 dropped to 84% and he required wheelchair assistance to get back to his room. He was given 6L [place] for recovery which increased his SpO2 to 91%, which he is currently maintaining on 4L [place]. ?-Start prednisone 20 mg daily for 5 days ?-Continue Augmentin 875-125 mg BID, day 1/5 ?-Azithromycin 500 mg daily, day 1/3 ?-Supportive measures: albuterol inhaler q4h PRN, [place] nebulizer BID, mucinex BID, DuoNeb q6h PRN ?-Wean to baseline 3L [place] as tolerated, goal SpO2 >88% ?  ?#Constipation ?Patient takes docusate and miralax at home for constipation, which has provided minimal  relief of his constipation. In the past he has had success with soap sud enema. Fortunately he has had some movement of gas and, though small, stool. ?-Soap sud enema ordered for one dose ?-MiraLAX daily and Senokot BID; will deescalate once patient has successful BM. ?  ?#Ankylosing Spondylitis ?#Chronic Back Pain ?#Uveitis ?Chronic conditions managed with extensive pain regimen and methotrexate once weekly as well as mycophenolate twice daily. Originally held on  admission given concern for pneumonia.  ?-Methotrexate 15 mg once weekly on Saturday ?-Mycophenolate 1500 mg BID ?-Continue home tramadol 50 mg BID, gabapentin 900 mg daily at bedtime, duloxetine 60 mg BID, cyclobenzaprine 30 mg daily at bedtime ?  ?#HTN ?Managed outpatient with lisinopril and diltiazem.  ?-Continue home lisinopril 20 mg daily and diltiazem 180 mg daily. ?  ?#OSA ?Continue CPAP for sleep. ? ?#Type 2 diabetes mellitus ?Home regimen includes metformin. ?-Continue metformin 500 mg BID ?-SSI and nighttime correction ?  ?#Depression ?#Anxiety ?#Insomnia ?Chronically managed with trazodone, buspirone, buproprione, lurasidone. ?-Continue trazodone 75 mg daily at bedtime, buspirone 15 mg daily, buproprione 300 mg daily, lurasidone 40 mg daily ?  ?#BPH ?Managed outpatient with Flomax 0.4 mg daily. ?-Continue Flomax 0.4 mg daily ? ?Best Practice: ?Diet: Cardiac diet ?IVF: None ?VTE: Eliquis ?Code: Full ?AB: Augmentin day 1/5, azithromycin day 1/3 ?DISPO: Anticipated discharge in 1-2 days to Home pending Medical stability. ? ?Signature: ?Farrel Gordon, D.O.  ?Internal Medicine Resident, PGY-1 ?[facility]  ?Pager: [phone] ?7:07 AM, 05/20/2021  ? ?Please contact the on call pager after 5 pm and on weekends at [phone].  ?

## 2021-05-20 NOTE — Plan of Care (Signed)
  Problem: Clinical Measurements: Goal: Will remain free from infection Outcome: Progressing   Problem: Clinical Measurements: Goal: Diagnostic test results will improve Outcome: Progressing   Problem: Clinical Measurements: Goal: Respiratory complications will improve Outcome: Progressing   

## 2021-05-21 ENCOUNTER — Emergency Department (HOSPITAL_COMMUNITY): Payer: No Typology Code available for payment source

## 2021-05-21 ENCOUNTER — Encounter (HOSPITAL_COMMUNITY): Payer: Self-pay | Admitting: Emergency Medicine

## 2021-05-21 ENCOUNTER — Other Ambulatory Visit (HOSPITAL_COMMUNITY): Payer: Self-pay

## 2021-05-21 ENCOUNTER — Observation Stay (HOSPITAL_COMMUNITY)
Admission: EM | Admit: 2021-05-21 | Discharge: 2021-05-25 | Disposition: A | Discharge status: Home Health | Payer: No Typology Code available for payment source | Attending: Internal Medicine | Admitting: Internal Medicine

## 2021-05-21 ENCOUNTER — Other Ambulatory Visit: Payer: Self-pay

## 2021-05-21 DIAGNOSIS — I10 Essential (primary) hypertension: Secondary | ICD-10-CM | POA: Insufficient documentation

## 2021-05-21 DIAGNOSIS — E871 Hypo-osmolality and hyponatremia: Secondary | ICD-10-CM | POA: Insufficient documentation

## 2021-05-21 DIAGNOSIS — Z7982 Long term (current) use of aspirin: Secondary | ICD-10-CM | POA: Insufficient documentation

## 2021-05-21 DIAGNOSIS — J9601 Acute respiratory failure with hypoxia: Principal | ICD-10-CM | POA: Diagnosis present

## 2021-05-21 DIAGNOSIS — Z79899 Other long term (current) drug therapy: Secondary | ICD-10-CM | POA: Insufficient documentation

## 2021-05-21 DIAGNOSIS — J441 Chronic obstructive pulmonary disease with (acute) exacerbation: Secondary | ICD-10-CM | POA: Diagnosis present

## 2021-05-21 DIAGNOSIS — Z85118 Personal history of other malignant neoplasm of bronchus and lung: Secondary | ICD-10-CM | POA: Insufficient documentation

## 2021-05-21 DIAGNOSIS — E119 Type 2 diabetes mellitus without complications: Secondary | ICD-10-CM | POA: Insufficient documentation

## 2021-05-21 DIAGNOSIS — Z7984 Long term (current) use of oral hypoglycemic drugs: Secondary | ICD-10-CM | POA: Insufficient documentation

## 2021-05-21 DIAGNOSIS — J189 Pneumonia, unspecified organism: Secondary | ICD-10-CM | POA: Diagnosis present

## 2021-05-21 DIAGNOSIS — Z7901 Long term (current) use of anticoagulants: Secondary | ICD-10-CM | POA: Insufficient documentation

## 2021-05-21 DIAGNOSIS — C3492 Malignant neoplasm of unspecified part of left bronchus or lung: Secondary | ICD-10-CM | POA: Diagnosis present

## 2021-05-21 LAB — GLUCOSE, CAPILLARY
Glucose-Capillary: 191 mg/dL — ABNORMAL HIGH (ref 70–99)
Glucose-Capillary: 195 mg/dL — ABNORMAL HIGH (ref 70–99)
Glucose-Capillary: 269 mg/dL — ABNORMAL HIGH (ref 70–99)

## 2021-05-21 LAB — CBC
HCT: 41.6 % (ref 39.0–52.0)
Hemoglobin: 13.9 g/dL (ref 13.0–17.0)
MCH: 32.2 pg (ref 26.0–34.0)
MCHC: 33.4 g/dL (ref 30.0–36.0)
MCV: 96.3 fL (ref 80.0–100.0)
Platelets: 300 10*3/uL (ref 150–400)
RBC: 4.32 MIL/uL (ref 4.22–5.81)
RDW: 13.9 % (ref 11.5–15.5)
WBC: 11.9 10*3/uL — ABNORMAL HIGH (ref 4.0–10.5)
nRBC: 0 % (ref 0.0–0.2)

## 2021-05-21 LAB — CBC WITH DIFFERENTIAL/PLATELET
Abs Immature Granulocytes: 0.04 10*3/uL (ref 0.00–0.07)
Basophils Absolute: 0 10*3/uL (ref 0.0–0.1)
Basophils Relative: 0 %
Eosinophils Absolute: 0.1 10*3/uL (ref 0.0–0.5)
Eosinophils Relative: 1 %
HCT: 44.1 % (ref 39.0–52.0)
Hemoglobin: 15 g/dL (ref 13.0–17.0)
Immature Granulocytes: 0 %
Lymphocytes Relative: 12 %
Lymphs Abs: 1.5 10*3/uL (ref 0.7–4.0)
MCH: 32.5 pg (ref 26.0–34.0)
MCHC: 34 g/dL (ref 30.0–36.0)
MCV: 95.5 fL (ref 80.0–100.0)
Monocytes Absolute: 1 10*3/uL (ref 0.1–1.0)
Monocytes Relative: 8 %
Neutro Abs: 10 10*3/uL — ABNORMAL HIGH (ref 1.7–7.7)
Neutrophils Relative %: 79 %
Platelets: 367 10*3/uL (ref 150–400)
RBC: 4.62 MIL/uL (ref 4.22–5.81)
RDW: 14 % (ref 11.5–15.5)
WBC: 12.6 10*3/uL — ABNORMAL HIGH (ref 4.0–10.5)
nRBC: 0 % (ref 0.0–0.2)

## 2021-05-21 LAB — BASIC METABOLIC PANEL
Anion gap: 8 (ref 5–15)
Anion gap: 9 (ref 5–15)
BUN: 14 mg/dL (ref 8–23)
BUN: 18 mg/dL (ref 8–23)
CO2: 27 mmol/L (ref 22–32)
CO2: 25 mmol/L (ref 22–32)
Calcium: 8.8 mg/dL — ABNORMAL LOW (ref 8.9–10.3)
Calcium: 9.2 mg/dL (ref 8.9–10.3)
Chloride: 97 mmol/L — ABNORMAL LOW (ref 98–111)
Chloride: 95 mmol/L — ABNORMAL LOW (ref 98–111)
Creatinine, Ser: 0.92 mg/dL (ref 0.61–1.24)
Creatinine, Ser: 1.14 mg/dL (ref 0.61–1.24)
GFR, Estimated: >60 mL/min (ref >60)
GFR, Estimated: >60 mL/min (ref >60)
Glucose, Bld: 193 mg/dL — ABNORMAL HIGH (ref 70–99)
Glucose, Bld: 148 mg/dL — ABNORMAL HIGH (ref 70–99)
Potassium: 4.5 mmol/L (ref 3.5–5.1)
Potassium: 4.7 mmol/L (ref 3.5–5.1)
Sodium: 132 mmol/L — ABNORMAL LOW (ref 135–145)
Sodium: 129 mmol/L — ABNORMAL LOW (ref 135–145)

## 2021-05-21 LAB — BRAIN NATRIURETIC PEPTIDE: B Natriuretic Peptide: 31.6 pg/mL (ref 0.0–100.0)

## 2021-05-21 LAB — TROPONIN I (HIGH SENSITIVITY): Troponin I (High Sensitivity): 15 ng/L (ref <18)

## 2021-05-21 MED ORDER — PREDNISONE 20 MG PO TABS
20.0000 mg | ORAL_TABLET | Freq: Every day | ORAL | 0 refills | Status: DC
Start: 2021-05-22 — End: 2021-05-25

## 2021-05-21 MED ORDER — AZITHROMYCIN 250 MG PO TABS
500.0000 mg | ORAL_TABLET | Freq: Every day | ORAL | Status: AC
  Administered 2021-05-22 – 2021-05-24 (×3): 500 mg via ORAL
  Filled 2021-05-21 (×3): qty 2

## 2021-05-21 MED ORDER — ENSURE ENLIVE PO LIQD
237.0000 mL | Freq: Three times a day (TID) | ORAL | Status: DC
  Administered 2021-05-22 – 2021-05-24 (×5): 237 mL via ORAL
  Filled 2021-05-21: qty 237

## 2021-05-21 MED ORDER — LISINOPRIL 20 MG PO TABS
20.0000 mg | ORAL_TABLET | Freq: Every day | ORAL | Status: DC
  Administered 2021-05-22 – 2021-05-24 (×3): 20 mg via ORAL
  Filled 2021-05-21 (×3): qty 1

## 2021-05-21 MED ORDER — PREDNISOLONE ACETATE 1 % OP SUSP: 1.0000 [drp] | OPHTHALMIC | Status: DC

## 2021-05-21 MED ORDER — PREDNISONE 20 MG PO TABS
20.0000 mg | ORAL_TABLET | Freq: Every day | ORAL | Status: AC
  Administered 2021-05-22 – 2021-05-24 (×3): 20 mg via ORAL
  Filled 2021-05-21 (×3): qty 1

## 2021-05-21 MED ORDER — BUDESONIDE 0.25 MG/2ML IN SUSP
0.2500 mg | Freq: Two times a day (BID) | RESPIRATORY_TRACT | Status: DC
  Administered 2021-05-22 – 2021-05-24 (×6): 0.25 mg via RESPIRATORY_TRACT
  Filled 2021-05-21 (×6): qty 2

## 2021-05-21 MED ORDER — CLOTRIMAZOLE-BETAMETHASONE 1-0.05 % EX CREA
TOPICAL_CREAM | Freq: Two times a day (BID) | CUTANEOUS | 0 refills | Status: DC
  Filled 2021-05-21: qty 15, 7d supply, fill #0

## 2021-05-21 MED ORDER — MOMETASONE FUROATE 200 MCG/ACT IN AERO: 2.0000 | INHALATION_SPRAY | Freq: Every day | RESPIRATORY_TRACT | Status: DC

## 2021-05-21 MED ORDER — AMOXICILLIN-POT CLAVULANATE 875-125 MG PO TABS
1.0000 | ORAL_TABLET | Freq: Two times a day (BID) | ORAL | 0 refills | Status: DC
  Filled 2021-05-21: qty 9, 5d supply, fill #0

## 2021-05-21 MED ORDER — DILTIAZEM HCL ER COATED BEADS 180 MG PO CP24
180.0000 mg | ORAL_CAPSULE | Freq: Every day | ORAL | Status: DC
  Administered 2021-05-22 – 2021-05-24 (×3): 180 mg via ORAL
  Filled 2021-05-21 (×3): qty 1

## 2021-05-21 MED ORDER — BUSPIRONE HCL 15 MG PO TABS
15.0000 mg | ORAL_TABLET | Freq: Every day | ORAL | Status: DC
  Administered 2021-05-22 – 2021-05-24 (×3): 15 mg via ORAL
  Filled 2021-05-21 (×2): qty 1
  Filled 2021-05-21: qty 2

## 2021-05-21 MED ORDER — CLOTRIMAZOLE-BETAMETHASONE 1-0.05 % EX CREA: TOPICAL_CREAM | Freq: Two times a day (BID) | CUTANEOUS | 0 refills | Status: DC

## 2021-05-21 MED ORDER — LURASIDONE HCL 40 MG PO TABS
40.0000 mg | ORAL_TABLET | Freq: Every day | ORAL | Status: DC
  Administered 2021-05-22 – 2021-05-24 (×3): 40 mg via ORAL
  Filled 2021-05-21 (×3): qty 1

## 2021-05-21 MED ORDER — TAMSULOSIN HCL 0.4 MG PO CAPS
0.4000 mg | ORAL_CAPSULE | Freq: Every day | ORAL | Status: DC
  Administered 2021-05-22 – 2021-05-24 (×3): 0.4 mg via ORAL
  Filled 2021-05-21 (×3): qty 1

## 2021-05-21 MED ORDER — GUAIFENESIN ER 600 MG PO TB12
600.0000 mg | ORAL_TABLET | Freq: Two times a day (BID) | ORAL | Status: DC
  Administered 2021-05-22 – 2021-05-24 (×6): 600 mg via ORAL
  Filled 2021-05-21 (×7): qty 1

## 2021-05-21 MED ORDER — CLOTRIMAZOLE-BETAMETHASONE 1-0.05 % EX CREA
TOPICAL_CREAM | Freq: Two times a day (BID) | CUTANEOUS | Status: DC
  Filled 2021-05-21 (×2): qty 15

## 2021-05-21 MED ORDER — ORAL CARE MOUTH RINSE
15.0000 mL | Freq: Two times a day (BID) | OROMUCOSAL | Status: DC
  Administered 2021-05-21: 15 mL via OROMUCOSAL

## 2021-05-21 MED ORDER — OYSTER SHELL CALCIUM/D3 500-5 MG-MCG PO TABS
1.0000 | ORAL_TABLET | Freq: Two times a day (BID) | ORAL | Status: DC
  Administered 2021-05-22 – 2021-05-24 (×6): 1 via ORAL
  Filled 2021-05-21 (×7): qty 1

## 2021-05-21 MED ORDER — VITAMIN D 25 MCG (1000 UNIT) PO TABS
1000.0000 [IU] | ORAL_TABLET | Freq: Every day | ORAL | Status: DC
  Administered 2021-05-22 – 2021-05-24 (×3): 1000 [IU] via ORAL
  Filled 2021-05-21 (×3): qty 1

## 2021-05-21 MED ORDER — FOLIC ACID 1 MG PO TABS
1.0000 mg | ORAL_TABLET | Freq: Every day | ORAL | Status: DC
  Administered 2021-05-22 – 2021-05-24 (×3): 1 mg via ORAL
  Filled 2021-05-21 (×3): qty 1

## 2021-05-21 MED ORDER — ACETAMINOPHEN 650 MG RE SUPP
650.0000 mg | Freq: Four times a day (QID) | RECTAL | Status: DC | PRN
Start: 2021-05-21 — End: 2021-05-25

## 2021-05-21 MED ORDER — METFORMIN HCL 500 MG PO TABS
500.0000 mg | ORAL_TABLET | Freq: Two times a day (BID) | ORAL | Status: DC
  Administered 2021-05-22 – 2021-05-24 (×6): 500 mg via ORAL
  Filled 2021-05-21 (×6): qty 1

## 2021-05-21 MED ORDER — B COMPLEX-C PO TABS
1.0000 | ORAL_TABLET | Freq: Every day | ORAL | Status: DC
  Administered 2021-05-22 – 2021-05-24 (×3): 1 via ORAL
  Filled 2021-05-21 (×3): qty 1

## 2021-05-21 MED ORDER — POLYVINYL ALCOHOL 1.4 % OP SOLN
1.0000 [drp] | Freq: Four times a day (QID) | OPHTHALMIC | Status: DC | PRN
  Filled 2021-05-21: qty 15

## 2021-05-21 MED ORDER — AZITHROMYCIN 500 MG PO TABS: 500.0000 mg | ORAL_TABLET | Freq: Every day | ORAL | 0 refills | Status: DC

## 2021-05-21 MED ORDER — TRAMADOL HCL 50 MG PO TABS
100.0000 mg | ORAL_TABLET | Freq: Every evening | ORAL | Status: DC | PRN
  Administered 2021-05-22 – 2021-05-24 (×3): 100 mg via ORAL
  Filled 2021-05-21 (×3): qty 2

## 2021-05-21 MED ORDER — ASCORBIC ACID 500 MG PO TABS
500.0000 mg | ORAL_TABLET | Freq: Every day | ORAL | Status: DC
  Administered 2021-05-22 – 2021-05-24 (×3): 500 mg via ORAL
  Filled 2021-05-21 (×3): qty 1

## 2021-05-21 MED ORDER — CYCLOSPORINE 0.05 % OP EMUL
1.0000 [drp] | Freq: Two times a day (BID) | OPHTHALMIC | Status: DC
  Administered 2021-05-22 – 2021-05-24 (×7): 1 [drp] via OPHTHALMIC
  Filled 2021-05-21 (×7): qty 30

## 2021-05-21 MED ORDER — MYCOPHENOLATE MOFETIL 250 MG PO CAPS
1500.0000 mg | ORAL_CAPSULE | Freq: Two times a day (BID) | ORAL | Status: DC
  Administered 2021-05-22 – 2021-05-24 (×6): 1500 mg via ORAL
  Filled 2021-05-21 (×8): qty 6

## 2021-05-21 MED ORDER — AMOXICILLIN-POT CLAVULANATE 875-125 MG PO TABS: 1.0000 | ORAL_TABLET | Freq: Two times a day (BID) | ORAL | 0 refills | Status: DC

## 2021-05-21 MED ORDER — OXCARBAZEPINE 300 MG PO TABS: 600.0000 mg | ORAL_TABLET | ORAL | Status: DC

## 2021-05-21 MED ORDER — DULOXETINE HCL 60 MG PO CPEP
60.0000 mg | ORAL_CAPSULE | Freq: Two times a day (BID) | ORAL | Status: DC
  Administered 2021-05-22 – 2021-05-24 (×6): 60 mg via ORAL
  Filled 2021-05-21 (×7): qty 1

## 2021-05-21 MED ORDER — GUAIFENESIN ER 600 MG PO TB12
600.0000 mg | ORAL_TABLET | Freq: Two times a day (BID) | ORAL | 0 refills | Status: DC
  Filled 2021-05-21: qty 12, 6d supply, fill #0

## 2021-05-21 MED ORDER — NITROGLYCERIN 0.4 MG SL SUBL: 0.4000 mg | SUBLINGUAL_TABLET | SUBLINGUAL | Status: DC | PRN

## 2021-05-21 MED ORDER — SALINE SPRAY 0.65 % NA SOLN
2.0000 | Freq: Four times a day (QID) | NASAL | Status: DC | PRN
  Filled 2021-05-21: qty 44

## 2021-05-21 MED ORDER — OXCARBAZEPINE 300 MG PO TABS
600.0000 mg | ORAL_TABLET | Freq: Two times a day (BID) | ORAL | Status: DC
  Administered 2021-05-22 – 2021-05-24 (×6): 600 mg via ORAL
  Filled 2021-05-21 (×7): qty 2

## 2021-05-21 MED ORDER — MELATONIN 3 MG PO TABS
6.0000 mg | ORAL_TABLET | Freq: Every day | ORAL | Status: DC
  Administered 2021-05-22 – 2021-05-23 (×3): 6 mg via ORAL
  Filled 2021-05-21 (×3): qty 2

## 2021-05-21 MED ORDER — ALBUTEROL SULFATE (2.5 MG/3ML) 0.083% IN NEBU: 3.0000 mL | INHALATION_SOLUTION | Freq: Four times a day (QID) | RESPIRATORY_TRACT | Status: DC | PRN

## 2021-05-21 MED ORDER — GABAPENTIN 300 MG PO CAPS
900.0000 mg | ORAL_CAPSULE | Freq: Every day | ORAL | Status: DC
  Administered 2021-05-22 – 2021-05-23 (×3): 900 mg via ORAL
  Filled 2021-05-21 (×3): qty 3

## 2021-05-21 MED ORDER — ADULT MULTIVITAMIN W/MINERALS CH
1.0000 | ORAL_TABLET | Freq: Every day | ORAL | Status: DC
  Administered 2021-05-22 – 2021-05-24 (×3): 1 via ORAL
  Filled 2021-05-21 (×3): qty 1

## 2021-05-21 MED ORDER — AZITHROMYCIN 500 MG PO TABS
500.0000 mg | ORAL_TABLET | Freq: Every day | ORAL | 0 refills | Status: DC
  Filled 2021-05-21: qty 1, 1d supply, fill #0

## 2021-05-21 MED ORDER — BUPROPION HCL ER (XL) 150 MG PO TB24
300.0000 mg | ORAL_TABLET | Freq: Every day | ORAL | Status: DC
  Administered 2021-05-22 – 2021-05-24 (×3): 300 mg via ORAL
  Filled 2021-05-21 (×2): qty 2
  Filled 2021-05-21: qty 1

## 2021-05-21 MED ORDER — ASPIRIN EC 81 MG PO TBEC
81.0000 mg | DELAYED_RELEASE_TABLET | Freq: Every day | ORAL | Status: DC
  Administered 2021-05-22 – 2021-05-24 (×3): 81 mg via ORAL
  Filled 2021-05-21 (×3): qty 1

## 2021-05-21 MED ORDER — CLOTRIMAZOLE-BETAMETHASONE 1-0.05 % EX CREA
TOPICAL_CREAM | Freq: Two times a day (BID) | CUTANEOUS | Status: DC
  Filled 2021-05-21: qty 15

## 2021-05-21 MED ORDER — PREDNISONE 20 MG PO TABS
20.0000 mg | ORAL_TABLET | Freq: Every day | ORAL | 0 refills | Status: DC
  Filled 2021-05-21: qty 3, 3d supply, fill #0

## 2021-05-21 MED ORDER — METHOTREXATE 2.5 MG PO TABS
15.0000 mg | ORAL_TABLET | ORAL | Status: DC
  Administered 2021-05-22: 15 mg via ORAL
  Filled 2021-05-21: qty 6

## 2021-05-21 MED ORDER — APIXABAN 5 MG PO TABS
5.0000 mg | ORAL_TABLET | Freq: Two times a day (BID) | ORAL | Status: DC
  Administered 2021-05-22 – 2021-05-24 (×7): 5 mg via ORAL
  Filled 2021-05-21 (×7): qty 1

## 2021-05-21 MED ORDER — TRAZODONE HCL 150 MG PO TABS
75.0000 mg | ORAL_TABLET | Freq: Every day | ORAL | Status: DC
  Administered 2021-05-22 – 2021-05-23 (×3): 75 mg via ORAL
  Filled 2021-05-21: qty 1
  Filled 2021-05-21: qty 2
  Filled 2021-05-21: qty 1

## 2021-05-21 MED ORDER — ACETAMINOPHEN 325 MG PO TABS
650.0000 mg | ORAL_TABLET | Freq: Four times a day (QID) | ORAL | Status: DC | PRN
  Administered 2021-05-22 – 2021-05-24 (×3): 650 mg via ORAL
  Filled 2021-05-21 (×3): qty 2

## 2021-05-21 MED ORDER — IPRATROPIUM-ALBUTEROL 0.5-2.5 (3) MG/3ML IN SOLN
3.0000 mL | Freq: Once | RESPIRATORY_TRACT | Status: AC
  Administered 2021-05-21: 3 mL via RESPIRATORY_TRACT
  Filled 2021-05-21: qty 3

## 2021-05-21 MED ORDER — POLYETHYL GLYCOL-PROPYL GLYCOL 0.4-0.3 % OP GEL: Freq: Every day | OPHTHALMIC | Status: DC | PRN

## 2021-05-21 MED ORDER — AMOXICILLIN-POT CLAVULANATE 875-125 MG PO TABS
1.0000 | ORAL_TABLET | Freq: Two times a day (BID) | ORAL | Status: DC
  Administered 2021-05-22 – 2021-05-24 (×7): 1 via ORAL
  Filled 2021-05-21 (×9): qty 1

## 2021-05-21 MED ORDER — GUAIFENESIN ER 600 MG PO TB12: 600.0000 mg | ORAL_TABLET | Freq: Two times a day (BID) | ORAL | 0 refills | Status: DC

## 2021-05-21 MED ORDER — MAGNESIUM SULFATE 2 GM/50ML IV SOLN
2.0000 g | Freq: Once | INTRAVENOUS | Status: AC
  Administered 2021-05-21: 2 g via INTRAVENOUS
  Filled 2021-05-21: qty 50

## 2021-05-21 MED ORDER — OXCARBAZEPINE 300 MG PO TABS
900.0000 mg | ORAL_TABLET | Freq: Every day | ORAL | Status: DC
  Administered 2021-05-22 – 2021-05-23 (×3): 900 mg via ORAL
  Filled 2021-05-21 (×4): qty 3

## 2021-05-21 MED ORDER — CLOTRIMAZOLE-BETAMETHASONE 1-0.05 % EX CREA: TOPICAL_CREAM | Freq: Two times a day (BID) | CUTANEOUS | Status: DC

## 2021-05-21 MED ORDER — CYCLOBENZAPRINE HCL 10 MG PO TABS
30.0000 mg | ORAL_TABLET | Freq: Every day | ORAL | Status: DC
  Administered 2021-05-22 – 2021-05-23 (×3): 30 mg via ORAL
  Filled 2021-05-21 (×4): qty 3

## 2021-05-21 NOTE — Progress Notes (Signed)
Mobility Specialist Progress Note: ? ? 05/21/21 0957  ?Mobility  ?Activity Ambulated with assistance in hallway  ?Level of Assistance Independent after set-up  ?Assistive Device [place]  ?Distance Ambulated (ft) 120 ft  ?Activity Response Tolerated well  ?$Mobility charge 1 Mobility  ? ?Pt received in bed willing to participate in mobility. Complaints of back pain. Pt required 1 seated break. Left EOB with call bell in reach and all needs met.  ?  ?SPO2 during: 92% ? ?Mario [name] ?Mobility Specialist ?Primary Phone [phone] ? ?

## 2021-05-21 NOTE — Discharge Summary (Signed)
? ?Name: Mario [name] ?MRN: [id number] ?DOB: November 26, 1950 71 y.o. ?PCP: [facility] ? ?Date of Admission: 05/19/2021  7:45 PM ?Date of Discharge:  05/21/2021 ?Attending Physician: Dr. Angelia Mould ? ?DISCHARGE DIAGNOSIS:  ?Primary Problem: Acute on chronic respiratory failure ([place])  ? ?Hospital Problems: ?Principal Problem: ?  Acute on chronic respiratory failure ([place]) ?Active Problems: ?  COPD (chronic obstructive pulmonary disease) ([place]) ?  CAP (community acquired pneumonia) ?  Constipation ?  Non-small cell cancer of left lung ([place]) ?  Hyponatremia ?  ? ?DISCHARGE MEDICATIONS:  ? ?Allergies as of 05/21/2021   ? ?   Reactions  ? Demerol [meperidine] Nausea And Vomiting, Other (See Comments)  ? Made the patient "violently sick"  ? Zocor [simvastatin] Nausea And Vomiting, Other (See Comments)  ? Made him very jittery, also  ? ?  ? ?  ?Medication List  ?  ? ?TAKE these medications   ? ?acetaminophen 500 MG tablet ?Commonly known as: TYLENOL ?Take 1,000 mg by mouth every 6 (six) hours as needed for moderate pain. ?  ?albuterol 108 (90 Base) MCG/ACT inhaler ?Commonly known as: VENTOLIN HFA ?Inhale 2 puffs into the lungs every 6 (six) hours as needed for wheezing. ?What changed: Another medication with the same name was changed. Make sure you understand how and when to take each. ?  ?albuterol (2.5 MG/3ML) 0.083% nebulizer solution ?Commonly known as: PROVENTIL ?Inhale 3 mLs (2.5 mg total) by nebulization every 4 (four) hours as needed for wheezing or shortness of breath. ?What changed: when to take this ?  ?amoxicillin-clavulanate 875-125 MG tablet ?Commonly known as: AUGMENTIN ?Take 1 tablet by mouth 2 (two) times daily. ?  ?apixaban 5 MG Tabs tablet ?Commonly known as: ELIQUIS ?Take 5 mg by mouth 2 (two) times daily. ?  ?aspirin 81 MG EC tablet ?Take 81 mg by mouth daily. ?  ?azithromycin 500 MG tablet ?Commonly known as: Zithromax ?Take 1 tablet (500 mg total) by mouth daily for 1 day. Take 1 tablet  daily. ?  ?b complex vitamins capsule ?Take 1 capsule by mouth daily. ?  ?bisacodyl 5 MG EC tablet ?Commonly known as: DULCOLAX ?Take 1 tablet (5 mg total) by mouth daily at 12 noon. ?What changed: when to take this ?  ?buPROPion 300 MG 24 hr tablet ?Commonly known as: WELLBUTRIN XL ?Take 300 mg by mouth daily. ?  ?busPIRone 15 MG tablet ?Commonly known as: BUSPAR ?Take 15 mg by mouth daily. ?  ?calcium-vitamin D 500-200 MG-UNIT tablet ?Commonly known as: OSCAL WITH D ?Take 1 tablet by mouth 2 (two) times daily with a meal. ?  ?carboxymethylcellulose 0.5 % Soln ?Commonly known as: REFRESH PLUS ?1 drop 4 (four) times daily as needed (dry eyes). ?  ?clobetasol ointment 0.05 % ?Commonly known as: TEMOVATE ?Apply 1 application. topically 2 (two) times daily as needed (itching). ?  ?clotrimazole-betamethasone cream ?Commonly known as: LOTRISONE ?Apply topically 2 (two) times daily. ?  ?cyclobenzaprine 10 MG tablet ?Commonly known as: FLEXERIL ?Take 10-30 mg by mouth at bedtime. 30mg  at bedtime, 10mg  during the day ?  ?cycloSPORINE 0.05 % ophthalmic emulsion ?Commonly known as: RESTASIS ?Place 1 drop into both eyes 2 (two) times daily. ?  ?diltiazem 180 MG 24 hr capsule ?Commonly known as: CARDIZEM CD ?Take 180 mg by mouth daily. ?  ?docusate sodium 100 MG capsule ?Commonly known as: COLACE ?Take 2 capsules (200 mg total) by mouth 2 (two) times daily. ?What changed: when to take this ?  ?DULoxetine  60 MG capsule ?Commonly known as: CYMBALTA ?Take 1 capsule (60 mg total) by mouth 2 (two) times daily. ?  ?feeding supplement Liqd ?Take 237 mLs by mouth 3 (three) times daily between meals. ?What changed: when to take this ?  ?fluticasone 50 MCG/ACT nasal spray ?Commonly known as: FLONASE ?Place 2 sprays into both nostrils daily. ?  ?folic acid 1 MG tablet ?Commonly known as: FOLVITE ?Take 1 mg by mouth daily. ?  ?gabapentin 300 MG capsule ?Commonly known as: NEURONTIN ?Take 900 mg by mouth at bedtime. ?  ?GARLIC OIL PO ?Take  1 capsule by mouth daily. ?  ?guaiFENesin 600 MG 12 hr tablet ?Commonly known as: [place] ?Take 1 tablet (600 mg total) by mouth 2 (two) times daily. ?  ?lisinopril 20 MG tablet ?Commonly known as: ZESTRIL ?Take 20 mg by mouth daily. ?  ?lurasidone 40 MG Tabs tablet ?Commonly known as: LATUDA ?Take 40 mg by mouth daily after supper. ?  ?melatonin 3 MG Tabs tablet ?Take 6 mg by mouth at bedtime. ?  ?metFORMIN 500 MG tablet ?Commonly known as: GLUCOPHAGE ?Take 500 mg by mouth 2 (two) times daily with a meal. ?  ?methotrexate 2.5 MG tablet ?Commonly known as: RHEUMATREX ?Take 15 mg by mouth every Saturday. ?  ?Mometasone Furoate 200 MCG/ACT Aero ?Inhale 2 puffs into the lungs daily. ?  ?multivitamin with minerals Tabs tablet ?Take 1 tablet by mouth daily. ?  ?mycophenolate 250 MG capsule ?Commonly known as: CELLCEPT ?Take 1,500 mg by mouth 2 (two) times daily. ?  ?nitroGLYCERIN 0.4 MG SL tablet ?Commonly known as: NITROSTAT ?Place 1 tablet (0.4 mg total) under the tongue every 5 (five) minutes as needed for chest pain. ?  ?OVER THE COUNTER MEDICATION ?Take 2 each by mouth at bedtime as needed (sleep). Pure Zzz ?  ?oxcarbazepine 600 MG tablet ?Commonly known as: TRILEPTAL ?Take 600-900 mg by mouth See admin instructions. Take 600mg  twice daily and 900mg  at bedtime ?  ?OXYGEN ?Inhale 3 L into the lungs at bedtime. ?  ?polyethylene glycol 17 g packet ?Commonly known as: MIRALAX / GLYCOLAX ?Take 17 g by mouth daily. ?  ?prednisoLONE acetate 1 % ophthalmic suspension ?Commonly known as: PRED FORTE ?Place 1 drop into the right eye every hour. ?  ?predniSONE 20 MG tablet ?Commonly known as: DELTASONE ?Take 1 tablet (20 mg total) by mouth daily with breakfast. ?Start taking on: May 22, 2021 ?  ?PRESCRIPTION MEDICATION ?Inhale into the lungs See admin instructions. CPAP- At bedtime and during during any naps ?  ?sodium chloride 0.65 % Soln nasal spray ?Commonly known as: OCEAN ?Place 2 sprays into both nostrils 4 (four) times  daily as needed for congestion. ?  ?[place] Respimat 2.5 MCG/ACT Aers ?Generic drug: Olodaterol HCl ?Inhale 2 puffs into the lungs daily. ?  ?SYSTANE OP ?Apply 1 drop to eye daily as needed (dry eye). ?  ?tamsulosin 0.4 MG Caps capsule ?Commonly known as: FLOMAX ?Take 0.4 mg by mouth daily. ?  ?traMADol 50 MG tablet ?Commonly known as: ULTRAM ?Take 100 mg by mouth at bedtime as needed for moderate pain. ?  ?traZODone 50 MG tablet ?Commonly known as: DESYREL ?Take 75 mg by mouth at bedtime. ?  ?VITAMIN A PO ?Take 1 tablet by mouth daily. ?  ?vitamin C 500 MG tablet ?Commonly known as: ASCORBIC ACID ?Take 500 mg by mouth daily. ?  ?Vitamin D-3 25 MCG (1000 UT) Caps ?Take 1,000 Units by mouth daily. ?  ? ?  ? ? ?  DISPOSITION AND FOLLOW-UP:  ?Mr.Mario [name] was discharged from [facility] in Stable condition. At the hospital follow up visit please address: ? ?Acute on Chronic Hypoxic Respiratory Failure ?COPD vs Community Acquired Pneumonia ?Hx of [place] Lung Cancer Stage II s/p radiation ?Monitor O2 requirements at rest and while ambulating for continued improvement to home baseline of 3L [place] while ambulating and minimal supplementation needs at rest. Ensure patient completed full course of antibiotic and steroid therapy. Consider repeat chest imaging in 6-8 weeks to monitor for resolution of suspected pneumonia. Consider monitoring blood glucose levels as he did have increased levels with steroid therapy during admission. ?  ?Constipation ?Follow-up regarding recurrent constipation issues and ensure that he has an adequate bowel regimen. ? ?Follow-up Recommendations: ?Consults: None ?Labs: Blood Sugar and CBC ?Studies: Consider repeat chest x-ray in 6-8 weeks. ?Medications:  ? ?START these medications: ?Augmentin 875-125 mg two times daily. Patient will have second dose for today to take at home and will resume twice daily dosing on 03/25. ?Azithromycin 500 mg daily for one day starting  03/25 ?Prednisone 20 mg daily for 3 days starting 03/25 ?Clotrimazole-betamethasone (Lotrisone) cream to groin twice daily ? ?Follow-up Appointments: ? Follow-up Information   ? ? [facility] Schedule an a

## 2021-05-21 NOTE — Evaluation (Signed)
Physical Therapy Evaluation ?Patient Details ?Name: Mario [name] ?MRN: [id number] ?DOB: Mar 12, 1950 ?Today's Date: 05/21/2021 ? ?History of Present Illness ? Pt is 71 yo male admitted on 05/19/21 with acute on chronic resp failure - COPD vs CAP and hx of [place] lung CA stage II s/p radiation.  Pt with hx including PE in 2023, OSA on CPAP, HTN, chronic back pain, hx of ankylosing spondylitis, anxiety, and depression, T2DM.  ?Clinical Impression ? Pt admitted with above diagnosis. At baseline, pt is independent but does report 3 L O2 use with activity and sleep.  Reports limited community ambulation due to shortness of breath and requiring frequent rest breaks with IADLs (putting groceries away- that are delivered).  Pt also reports chronic back pain that is unchanged at this time.  Today, he was able to ambulate 60'x2 with standing rest break, required O2 and cues for pursed lip breathing.  Pt reports he is not typically able to ambulate much further but is not typically this fatigued. He is interested in [place], Pt currently with functional limitations due to the deficits listed below (see PT Problem List). Pt will benefit from skilled PT to increase their independence and safety with mobility to allow discharge to the venue listed below.   ?   ? SATURATION QUALIFICATIONS: (This note is used to comply with regulatory documentation for home oxygen) ? ?Patient Saturations on Room Air at Rest = 91% ? ?Patient Saturations on Room Air while Ambulating = 86% with short distance ? ?Patient Saturations on 2 Liters of oxygen while Ambulating = 91%. Of note pt very short of breath on 2 L and reports uses 3 L at home.  Placed on 3 L to complete walk with sats 93%. ? ?Please briefly explain why patient needs home oxygen: To maintain O2 sats with activity.  ? ?Recommendations for follow up therapy are one component of a multi-disciplinary discharge planning process, led by the attending physician.  Recommendations may be updated based  on patient status, additional functional criteria and insurance authorization. ? ?Follow Up Recommendations Home health PT ? ?  ?Assistance Recommended at Discharge PRN  ?Patient can return home with the following ? Assist for transportation ? ?  ?Equipment Recommendations None recommended by PT  ?Recommendations for Other Services ?    ?  ?Functional Status Assessment Patient has had a recent decline in their functional status and demonstrates the ability to make significant improvements in function in a reasonable and predictable amount of time.  ? ?  ?Precautions / Restrictions Precautions ?Precautions: Fall ?Precaution Comments: monitor sats  ? ?  ? ?Mobility ? Bed Mobility ?Overal bed mobility: Needs Assistance ?Bed Mobility: Supine to Sit, Sit to Supine ?  ?  ?Supine to sit: Independent ?Sit to supine: Independent ?  ?  ?  ? ?Transfers ?Overall transfer level: Needs assistance ?Equipment used: Straight cane ?Transfers: Sit to/from Stand ?Sit to Stand: Supervision ?  ?  ?  ?  ?  ?General transfer comment: performed from bed and toilet; assist with [place] ADLs ?  ? ?Ambulation/Gait ?Ambulation/Gait assistance: Min guard ?Gait Distance (Feet): 60 Feet (60'x2) ?Assistive device: Straight cane ?Gait Pattern/deviations: Step-through pattern, Decreased stride length, Wide base of support ?Gait velocity: decrased ?  ?  ?General Gait Details: Ambulated 15'x2 to /from restroom with seated rest breaks then 60'x2 with standing rest break.  Pt utilizing cane. No LOB but fatigued easily and cued for pursed lip breathing ? ?Stairs ?  ?  ?  ?  ?  ? ?  Wheelchair Mobility ?  ? ?Modified Rankin (Stroke Patients Only) ?  ? ?  ? ?Balance Overall balance assessment: Needs assistance ?Sitting-balance support: No upper extremity supported ?Sitting balance-Leahy Scale: Normal ?  ?  ?Standing balance support: Single extremity supported, No upper extremity supported ?Standing balance-Leahy Scale: Fair ?Standing balance comment: cane  to ambulate but could static stand no AD ?  ?  ?  ?  ?  ?  ?  ?  ?  ?  ?  ?   ? ? ? ?Pertinent Vitals/Pain Pain Assessment ?Pain Assessment: 0-10 ?Pain Score: 4  ?Pain Location: chronic back pain ?Pain Descriptors / Indicators: Discomfort ?Pain Intervention(s): Limited activity within patient's tolerance, Monitored during session  ? ? ?Home Living Family/patient expects to be discharged to:: Private residence ?Living Arrangements: Other (Comment) (roommate) ?Available Help at Discharge: Friend(s);Available PRN/intermittently ?Type of Home: Apartment ?Home Access: Stairs to enter ?Entrance Stairs-Rails: Right ?Entrance Stairs-Number of Steps: 5 - reports really high ?  ?Home Layout: One level ?Home Equipment: Shower seat;Hand held Engineering geologist (2 wheels);Cane - single point ?Additional Comments: wears 3 L O2 with activity and sleep  ?  ?Prior Function Prior Level of Function : Independent/Modified Independent ?  ?  ?  ?  ?  ?  ?Mobility Comments: Can ambulate short community distances with cane; no AD in home; reports couple falls in last 6 months but fell onto bed ?ADLs Comments: independent with ADLs and IADLs; reports sits to shower, easily fatigues ?  ? ? ?Hand Dominance  ? Dominant Hand: Right ? ?  ?Extremity/Trunk Assessment  ? Upper Extremity Assessment ?Upper Extremity Assessment: Overall WFL for tasks assessed ?  ? ?Lower Extremity Assessment ?Lower Extremity Assessment: Overall WFL for tasks assessed ?  ? ?Cervical / Trunk Assessment ?Cervical / Trunk Assessment: Other exceptions ?Cervical / Trunk Exceptions: hx of back pain  ?Communication  ? Communication: No difficulties  ?Cognition Arousal/Alertness: Awake/alert ?Behavior During Therapy: [facility] for tasks assessed/performed ?Overall Cognitive Status: Within Functional Limits for tasks assessed ?  ?  ?  ?  ?  ?  ?  ?  ?  ?  ?  ?  ?  ?  ?  ?  ?  ?  ?  ? ?  ?General Comments General comments (skin integrity, edema, etc.): Pt on 5 L O2 at arrival  with sats 99%.  Tried RA and at rest 91% and drops to 86% with activity.  2 L to ambulate with sats91% but pt reports very SOB and typically uses 3L.  Increased to 3 L and sats 93% with activity. ? ?  ?Exercises    ? ?Assessment/Plan  ?  ?PT Assessment Patient needs continued PT services  ?PT Problem List Decreased strength;Decreased mobility;Decreased activity tolerance;Cardiopulmonary status limiting activity;Decreased balance;Decreased knowledge of use of DME ? ?   ?  ?PT Treatment Interventions DME instruction;Therapeutic activities;Gait training;Therapeutic exercise;Patient/family education;Stair training;Balance training;Functional mobility training   ? ?PT Goals (Current goals can be found in the Care Plan section)  ?Acute Rehab PT Goals ?Patient Stated Goal: return home ?PT Goal Formulation: With patient ?Time For Goal Achievement: 06/04/21 ?Potential to Achieve Goals: Good ? ?  ?Frequency Min 3X/week ?  ? ? ?Co-evaluation   ?  ?  ?  ?  ? ? ?  ?AM-PAC PT "6 Clicks" Mobility  ?Outcome Measure Help needed turning from your back to your side while in a flat bed without using bedrails?: None ?Help needed moving from lying on  your back to sitting on the side of a flat bed without using bedrails?: None ?Help needed moving to and from a bed to a chair (including a wheelchair)?: A Little ?Help needed standing up from a chair using your arms (e.g., wheelchair or bedside chair)?: A Little ?Help needed to walk in hospital room?: A Little ?Help needed climbing 3-5 steps with a railing? : A Little ?6 Click Score: 20 ? ?  ?End of Session Equipment Utilized During Treatment: Oxygen ?Activity Tolerance: Patient tolerated treatment well ?Patient left: in bed;with call bell/phone within reach ?Nurse Communication: Mobility status ?PT Visit Diagnosis: Other abnormalities of gait and mobility (R26.89) ?  ? ?Time: 8301-4159 ?PT Time Calculation (min) (ACUTE ONLY): 31 min ? ? ?Charges:   PT Evaluation ?$PT Eval Low Complexity: 1  Low ?PT Treatments ?$Gait Training: 8-22 mins ?  ?   ? ? ?Abran Richard, PT ?Acute Rehab Services ?Pager [phone] ?[facility] [phone] ? ? ?Mikael Spray Khyler Urda ?05/21/2021, 12:18 PM ? ?

## 2021-05-21 NOTE — Progress Notes (Signed)
PT Cancellation Note ? ?Patient Details ?Name: Mario [name] ?MRN: [id number] ?DOB: 01-26-1951 ? ? ?Cancelled Treatment:    Reason Eval/Treat Not Completed: Other (comment) ?Pt reports just walked with mobility specialist and back hurts - does not feel like walking right now.  PT orders for imminent d/c and to assess O2 needs - will f/u in a couple hours as able.  ?Abran Richard, PT ?Acute Rehab Services ?Pager [phone] ?[facility] [phone] ? ? ?Mikael Spray Garald Rhew ?05/21/2021, 10:12 AM ?

## 2021-05-21 NOTE — ED Triage Notes (Signed)
Pt BIB [facility] from home, c/o increased shortness of breath. Seen in ED previously this week for same. Hx COPD and lung cancer. On EMS arrival, room SpO2 85%, pt states he wears oxygen at home "sometimes" when he needs help catching his breath. Given 5mg  albuterol. No new swelling or weight gain. ?

## 2021-05-21 NOTE — TOC Initial Note (Addendum)
Transition of Care (TOC) - Initial/Assessment Note  ? ? ?Patient Details  ?Name: Mario [name] ?MRN: [id number] ?Date of Birth: 1950-04-12 ? ?Transition of Care (TOC) CM/SW Contact:    ?Marilu Favre, RN ?Phone Number: ?05/21/2021, 2:20 PM ? ?Clinical Narrative:                 ?Patient for discharge today. NCM confirmed face sheet information. Patient already has home oxygen, requesting [facility] transport home. [facility] called and paperwork on chart  ? ?[facility] called. Patient has had [facility] in the past and would like them again . [place] accepted referral ? ?Patient wants to use his VA benefit for home health services. NCM called [facility] and was instructed to fax discharge summary and orders to Dr Radford Pax [phone]. Fax sent  [place] F[place] with [facility] aware and will follow up .  ? ?Expected Discharge Plan: [place] ?Barriers to Discharge: No Barriers Identified ? ? ?Patient Goals and CMS Choice ?Patient states their goals for this hospitalization and ongoing recovery are:: to go home ?CMS Medicare.gov Compare Post Acute Care list provided to:: Patient ?Choice offered to / list presented to : Patient ? ?Expected Discharge Plan and Services ?Expected Discharge Plan: [place] ?  ?Discharge Planning Services: CM Consult ?Post Acute Care Choice: Home Health ?Living arrangements for the past 2 months: Apartment ?Expected Discharge Date: 05/21/21               ?  ?DME Agency: NA ?  ?  ?  ?HH Arranged: PT ?[place] Agency: [facility]) ?Date HH Agency Contacted: 05/21/21 ?Time [place]: 7169 ?Representative spoke with at [place]: [place]lee ? ?Prior Living Arrangements/Services ?Living arrangements for the past 2 months: Apartment ?Lives with:: Roommate ?Patient language and need for interpreter reviewed:: Yes ?Do you feel safe going back to the place where you live?: Yes      ?Need for Family Participation in Patient Care: Yes  (Comment) ?Care giver support system in place?: Yes (comment) ?Current home services: DME ?Criminal Activity/Legal Involvement Pertinent to Current Situation/Hospitalization: No - Comment as needed ? ?Activities of Daily Living ?  ?  ? ?Permission Sought/Granted ?  ?Permission granted to share information with : No ?   ?   ?   ?   ? ?Emotional Assessment ?Appearance:: Appears stated age ?Attitude/Demeanor/Rapport: Engaged ?Affect (typically observed): Accepting ?Orientation: : Oriented to Self, Oriented to Place, Oriented to  Time, Oriented to Situation ?Alcohol / Substance Use: Not Applicable ?Psych Involvement: No (comment) ? ?Admission diagnosis:  CAP (community acquired pneumonia) [J18.9] ?Patient Active Problem List  ? Diagnosis Date Noted  ? Hyponatremia 05/20/2021  ? Acute pulmonary embolism without acute cor pulmonale ([place]) 02/03/2021  ? Mood disorder ([place]) 02/03/2021  ? Acute on chronic respiratory failure ([place]) 01/09/2021  ? Non-small cell cancer of left lung ([place]) 09/21/2020  ? Lung nodule 07/30/2020  ? Mass of left lung   ? Acute respiratory failure with hypoxemia ([place]) 07/26/2020  ? Constipation   ? CAP (community acquired pneumonia) 04/03/2020  ? COPD with acute exacerbation ([place]) 01/05/2020  ? Vertigo 12/26/2019  ? 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 Stage 3a chronic kidney disease ([place]) 12/26/2019  ? Abnormal CT of the chest 12/26/2019  ? COPD (chronic obstructive pulmonary disease) ([place])   ? AKI (acute kidney injury) ([place])   ? Hyperglycemia 06/23/2019  ? OSA (obstructive sleep apnea) 06/23/2019  ?  Suicidal ideation 06/23/2019  ? Major depressive disorder, recurrent episode ([place]) 10/12/2018  ? 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 Cellulitis of both feet 07/16/2017  ? DM2 (diabetes mellitus, type 2) ([place]) 07/16/2017  ? HLA B27 (HLA B27 positive) 07/16/2017  ? Acute chest pain   ? Hyperkalemia   ? Spontaneous pneumothorax 06/27/2016  ? Hypoxia   ? Pneumothorax  on left   ? Dyspnea 05/23/2014  ? COPD exacerbation ([place]) 05/23/2014  ? Malingering 01/21/2013  ? Depression 01/11/2013  ? Tobacco abuse 01/11/2013  ? Obesity, unspecified 01/11/2013  ? Chest pain 01/10/2013  ? Hypertension associated with diabetes ([place]) 01/10/2013  ? ?PCP:  [facility] ?Pharmacy:   ?[facility] ?[phone] ?[place] [address] ?Phone: [phone] Fax: [phone] ? ?[facility] ?[address] ?[place] [address] ?Phone: [phone] Fax: [phone] ? ?[id number] [place] [address] [address]?Phone: [phone]: [phone]? ?[facility] ?[address]?Phone: [phone]: [phone] ? ? ? ? ?Social Determinants of Health (SDOH) Interventions ?  ? ?Readmission Risk Interventions ?   ? View : No data to display.  ?  ?  ?  ? ? ? ?

## 2021-05-21 NOTE — Progress Notes (Signed)
Pt discharged home with [facility] in stable condition  ?

## 2021-05-21 NOTE — H&P (Addendum)
? ? ? ?Date: 05/21/2021     ?     ?     ?Patient Name:  Mario [name] MRN: [id number]  ?DOB: 12-04-1950 Age / Sex: 71 y.o., male   ?PCP: [facility]    ?     ?Medical Service: Internal Medicine Teaching Service    ?     ?Attending Physician: Dr. Heber , Rachel Moulds, DO    ?First Contact: Dr. Marlou Sa Pager: [phone]  ?Second Contact: Dr. Coy Saunas Pager: [phone]  ?     ?After Hours (After 5p/  First Contact Pager: [phone]  ?weekends / holidays): Second Contact Pager: [phone]  ? ?Chief Complaint: SOB ? ?History of Present Illness: Mario [name] is a 71 year old gentleman with history of COPD, non-small cell lung cancer stage IIb in 06/2020 status post radiation therapy, recent PE on Eliquis, OSA on CPAP, hypertension, chronic back and neck pain, ankylosing spondylitis, uveitis, anxiety and depression presenting with worsening shortness of breath.  Patient was just admitted to the hospital on 3/22 for acute hypoxic respiratory failure secondary to CAP and COPD exacerbation.  He was treated with antibiotics and prednisone, discharged earlier today.  He states since returning home he was feeling poorly with worsening shortness of breath even while on his supplemental oxygen.  States he tried turning his oxygen requirement up on his home tank without any improvement and subsequently called EMS.  On EMS arrival he was found to be tachycardic to the 130s, tachypneic 24, and saturating at 85% on room air.  He initially required nonrebreather and quickly transitioned to 5 L nasal cannula.  Since being in the ER he states that his breathing feels about the same.  He feels that he becomes more short of breath with any physical exertion such as lying flat, turning in the bed or standing up to urinate.  He denies any chest pain, fevers, nausea, vomiting, abdominal pain.  He states that his bowel movements have significantly improved since receiving an enema.  He also endorses a productive cough with  sputum. ? ?Meds:  ?No current facility-administered medications on file prior to encounter.  ? ?Current Outpatient Medications on File Prior to Encounter  ?Medication Sig Dispense Refill  ? acetaminophen (TYLENOL) 500 MG tablet Take 1,000 mg by mouth every 6 (six) hours as needed for moderate pain.    ? albuterol (PROVENTIL) (2.5 MG/3ML) 0.083% nebulizer solution Inhale 3 mLs (2.5 mg total) by nebulization every 4 (four) hours as needed for wheezing or shortness of breath. (Patient taking differently: Take 2.5 mg by nebulization every 6 (six) hours as needed for wheezing or shortness of breath.) 90 mL 2  ? albuterol (VENTOLIN HFA) 108 (90 Base) MCG/ACT inhaler Inhale 2 puffs into the lungs every 6 (six) hours as needed for wheezing.    ? amoxicillin-clavulanate (AUGMENTIN) 875-125 MG tablet Take 1 tablet by mouth 2 (two) times daily. 9 tablet 0  ? apixaban (ELIQUIS) 5 MG TABS tablet Take 5 mg by mouth 2 (two) times daily.    ? aspirin 81 MG EC tablet Take 81 mg by mouth daily.    ? azithromycin (ZITHROMAX) 500 MG tablet Take 1 tablet (500 mg total) by mouth daily for 1 day. Take 1 tablet daily. 1 tablet 0  ? b complex vitamins capsule Take 1 capsule by mouth daily.    ? bisacodyl (DULCOLAX) 5 MG EC tablet Take 1 tablet (5 mg total) by mouth daily at 12 noon. (Patient taking differently: Take 5  mg by mouth daily.) 30 tablet 0  ? buPROPion (WELLBUTRIN XL) 300 MG 24 hr tablet Take 300 mg by mouth daily.    ? busPIRone (BUSPAR) 15 MG tablet Take 15 mg by mouth daily.    ? calcium-vitamin D (OSCAL WITH D) 500-200 MG-UNIT tablet Take 1 tablet by mouth 2 (two) times daily with a meal.    ? carboxymethylcellulose (REFRESH PLUS) 0.5 % SOLN 1 drop 4 (four) times daily as needed (dry eyes).    ? Cholecalciferol (VITAMIN D-3) 25 MCG (1000 UT) CAPS Take 1,000 Units by mouth daily.    ? clobetasol ointment (TEMOVATE) 2.95 % Apply 1 application. topically 2 (two) times daily as needed (itching).    ? clotrimazole-betamethasone  (LOTRISONE) cream Apply topically 2 (two) times daily. 30 g 0  ? cyclobenzaprine (FLEXERIL) 10 MG tablet Take 10-30 mg by mouth at bedtime. 30mg  at bedtime, 10mg  during the day    ? cycloSPORINE (RESTASIS) 0.05 % ophthalmic emulsion Place 1 drop into both eyes 2 (two) times daily.    ? diltiazem (CARDIZEM CD) 180 MG 24 hr capsule Take 180 mg by mouth daily.    ? docusate sodium (COLACE) 100 MG capsule Take 2 capsules (200 mg total) by mouth 2 (two) times daily. (Patient taking differently: Take 200 mg by mouth daily.) 10 capsule 0  ? DULoxetine (CYMBALTA) 60 MG capsule Take 1 capsule (60 mg total) by mouth 2 (two) times daily. 60 capsule 0  ? feeding supplement (ENSURE ENLIVE / ENSURE PLUS) LIQD Take 237 mLs by mouth 3 (three) times daily between meals. (Patient taking differently: Take 237 mLs by mouth daily.) 10000 mL 0  ? fluticasone (FLONASE) 50 MCG/ACT nasal spray Place 2 sprays into both nostrils daily.     ? folic acid (FOLVITE) 1 MG tablet Take 1 mg by mouth daily.    ? gabapentin (NEURONTIN) 300 MG capsule Take 900 mg by mouth at bedtime.    ? GARLIC OIL PO Take 1 capsule by mouth daily.    ? guaiFENesin (MUCINEX) 600 MG 12 hr tablet Take 1 tablet (600 mg total) by mouth 2 (two) times daily. 12 tablet 0  ? lisinopril (ZESTRIL) 20 MG tablet Take 20 mg by mouth daily.    ? lurasidone (LATUDA) 40 MG TABS tablet Take 40 mg by mouth daily after supper.    ? Melatonin 3 MG TABS Take 6 mg by mouth at bedtime.    ? metFORMIN (GLUCOPHAGE) 500 MG tablet Take 500 mg by mouth 2 (two) times daily with a meal.    ? methotrexate (RHEUMATREX) 2.5 MG tablet Take 15 mg by mouth every Saturday.    ? Mometasone Furoate 200 MCG/ACT AERO Inhale 2 puffs into the lungs daily.    ? Multiple Vitamin (MULTIVITAMIN WITH MINERALS) TABS tablet Take 1 tablet by mouth daily.    ? mycophenolate (CELLCEPT) 250 MG capsule Take 1,500 mg by mouth 2 (two) times daily.    ? nitroGLYCERIN (NITROSTAT) 0.4 MG SL tablet Place 1 tablet (0.4 mg total)  under the tongue every 5 (five) minutes as needed for chest pain. 12 tablet 12  ? Olodaterol HCl (STRIVERDI RESPIMAT) 2.5 MCG/ACT AERS Inhale 2 puffs into the lungs daily.    ? OVER THE COUNTER MEDICATION Take 2 each by mouth at bedtime as needed (sleep). Pure Zzz    ? oxcarbazepine (TRILEPTAL) 600 MG tablet Take 600-900 mg by mouth See admin instructions. Take 600mg  twice daily and 900mg  at bedtime    ?  OXYGEN Inhale 3 L into the lungs at bedtime.    ? Polyethyl Glycol-Propyl Glycol (SYSTANE OP) Apply 1 drop to eye daily as needed (dry eye).    ? polyethylene glycol (MIRALAX / GLYCOLAX) 17 g packet Take 17 g by mouth daily.    ? prednisoLONE acetate (PRED FORTE) 1 % ophthalmic suspension Place 1 drop into the right eye every hour.    ? predniSONE (DELTASONE) 20 MG tablet Take 1 tablet (20 mg total) by mouth daily with breakfast. 3 tablet 0  ? PRESCRIPTION MEDICATION Inhale into the lungs See admin instructions. CPAP- At bedtime and during during any naps    ? sodium chloride (OCEAN) 0.65 % SOLN nasal spray Place 2 sprays into both nostrils 4 (four) times daily as needed for congestion.    ? tamsulosin (FLOMAX) 0.4 MG CAPS capsule Take 0.4 mg by mouth daily.    ? traMADol (ULTRAM) 50 MG tablet Take 100 mg by mouth at bedtime as needed for moderate pain.    ? traZODone (DESYREL) 50 MG tablet Take 75 mg by mouth at bedtime.    ? VITAMIN A PO Take 1 tablet by mouth daily.    ? vitamin C (ASCORBIC ACID) 500 MG tablet Take 500 mg by mouth daily.    ?  ? ? ?Allergies: ?Allergies as of 05/21/2021 - Review Complete 05/21/2021  ?Allergen Reaction Noted  ? Demerol [meperidine] Nausea And Vomiting and Other (See Comments) 01/09/2013  ? Zocor [simvastatin] Nausea And Vomiting and Other (See Comments) 01/09/2013  ? ?Past Medical History:  ?Diagnosis Date  ? Anxiety   ? Bronchitis   ? COPD (chronic obstructive pulmonary disease) ([place])   ? Depression   ? History of radiation therapy   ? Left lung- 10/05/20-10/15/20- Dr. Gery Pray  ? Hypertension   ? lung ca 09/2020  ? Mental disorder   ? MI (myocardial infarction) ([place])   ? ????  ? OSA (obstructive sleep apnea)   ? Suicide attempt [facility])   ? Tension pneumothorax 06/27/2016  ? Uveitis   ? ? ?

## 2021-05-21 NOTE — Hospital Course (Addendum)
Acute on Chronic Hypoxic Respiratory Failure ?COPD vs Community Acquired Pneumonia ?Hx of [place] Lung Cancer Stage II s/p radiation ?Patient presented to [facility] ED with shortness of breath found to require increased oxygen supplementation from baseline. He had some tachycardia but was otherwise hemodynamically stable without fever and mild leukocytosis. He received breathing treatments in the ED and a one-time dose of ceftriaxone. CTA chest was collected and did not show evidence of pulmonary embolism but did show a new left-sided consolidation suspicious for infection. He was admitted and started on Augmentin and azithromycin with breathing treatments and symptomatic management as well as prednisone for mild COPD exacerbation. An initial attempt at ambulatory saturations was made and during that time his SpO2 dropped to 84% on 3L [place]. This recovered with rest and increasing O2 supplementation and patient tolerated ambulating with 4L [place] with as needed rest breaks with appropriate oxygen saturations on day of discharge. ?  ?Constipation ?Patient reported severe constipation in the weeks prior to admission with very small bowel movement and some gas movement. He was given MiraLax and Senokot scheduled without relief. He endorsed previous success with Soap Sud enema which was given prior to discharge with successful BM. ?  ?Ankylosing Spondylitis ?Chronic Back Pain ?Uveitis ?Chronic conditions. Home methotrexate and mycophenolate were continued. Home pain regimen of tramadol, gabapentin, duloxetine, cyclobenzaprine were continued. ?  ?HTN ?Chronic condition. Continued home lisinopril and diltiazem.  ?  ?OSA ?Chronic condition. Continued CPAP for sleep. ?  ?Type 2 diabetes mellitus ?Chronic condition. Continued home metformin and SSI at mealtime and with nighttime correction. ?  ?Depression ?Anxiety ?Insomnia ?Chronically conditions. Continued home trazodone, buspirone, buproprione, lurasidone. ?  ?BPH ?Chronic condition.  Continued home Flomax. ?

## 2021-05-21 NOTE — Progress Notes (Deleted)
? ?HD#1 ?SUBJECTIVE:  ?Patient Summary: Mario [name] is a 71 y.o. male with past medical history of COPD, [place] lung cancer stage IIb in 06/2020 s/p radiation therapy, PE in 2023, OSA on CPAP, HTN, chronic back pain, hx of ankylosing spondylitis, anxiety, and depression, T2DM, who is presenting to the ED with shortness of breath and admitted for possible CAP with mild COPD exacerbation. ? ?Overnight Events: Patient was started on lotrisone cream for rash in his groin which did improve with the cream. He had a brief, self-resolving episode of chest pressure as well not felt to be ACS in nature. ? ?Interim History: Patient reports feeling better today than when he first came into the hospital in that his breathing has improved and he was able to walk some without drastic decreases in his oxygen level. He had back pain when walking and needed a rest break but does have chronic back pain from ankylosing spondylitis. He endorses improvement of his groin rash after he was started on lotrisone cream last night. ? ?OBJECTIVE:  ?Vital Signs: ?Vitals:  ? 05/20/21 2211 05/21/21 0210 05/21/21 0815 05/21/21 0857  ?BP: 129/78 (!) 151/91 130/79   ?Pulse: 91 83 79   ?Resp: 20 20 17    ?Temp: 97.6 ?F (36.4 ?C)  97.7 ?F (36.5 ?C)   ?TempSrc:   Oral   ?SpO2: 93% 98% 98% 93%  ? ?Supplemental O2: Nasal Cannula ?SpO2: 93 % ?O2 Flow Rate (L/min): 5 L/min ? ?There were no vitals filed for this visit. ? ? ?Intake/Output Summary (Last 24 hours) at 05/21/2021 1203 ?Last data filed at 05/21/2021 1000 ?Gross per 24 hour  ?Intake 370 ml  ?Output 475 ml  ?Net -105 ml  ? ?Net IO Since Admission: -105 mL [05/21/21 1203] ? ?Physical Exam: ?Constitutional:Patient is smiling, sitting at side of bed enjoying his breakfast. No acute distress. ?Cardio:Regular rate and rhythm. No murmurs, rubs, gallops. ?Pulm:Decreased breath sounds with mild crackles over L lung base. ?Abdomen:Soft, nontender, nondistended. ?Skin:Warm and dry. ?Neuro:Alert and oriented  x3. No focal deficit noted. ?Psych:Pleasant mood and affect.  ? ?Patient Lines/Drains/Airways Status   ? ? Active Line/Drains/Airways   ? ? Name Placement date Placement time Site Days  ? Peripheral IV 05/19/21 18 G 1" Anterior;Distal;Right;Upper Antecubital 05/19/21  2238  Antecubital  2  ? ?  ?  ? ?  ? ? ? ?ASSESSMENT/PLAN:  ?Assessment: ?Principal Problem: ?  Acute on chronic respiratory failure ([place]) ?Active Problems: ?  COPD (chronic obstructive pulmonary disease) ([place]) ?  CAP (community acquired pneumonia) ?  Constipation ?  Non-small cell cancer of left lung ([place]) ?  Hyponatremia ? ? ?Plan: ?#Acute on Chronic Hypoxic Respiratory Failure ?#COPD vs Community Acquired Pneumonia ?#Hx of [place] Lung Cancer Stage II s/p radiation ?On ambulation with mobility technician today he was able to maintain O2 saturation of 92% on 4L [place] and as needed rest breaks. He had back pain while ambulating but endorses chronic back pain due to ankylosing spondylitis. Overall he feels that his symptoms are improving including his breathing. He remains hemodynamically stable without fever. He has a mild leukocytosis but was started on steroid therpay for mild COPD exacerbation so we will monitor this for now. ?-Continue prednisone 20 mg daily, day 2/5 ?-Start Augmentin 875-125 mg BID, day 1/5 ?-Continue Azithromycin 500 mg daily, day 2/3 ?-Supportive measures: albuterol inhaler q4h PRN, [place] nebulizer BID, mucinex BID, DuoNeb q6h PRN ?-Wean to baseline 3L [place] as tolerated, goal SpO2 >88% ?  ?#Constipation ?Unfortunately  patient has still not had a bowel movement. He has not yet received the enema ordered at admission.  ?-Soap sud enema ordered for one dose ?-MiraLAX daily and Senokot BID; will deescalate once patient has successful BM. ?  ?#Ankylosing Spondylitis ?#Chronic Back Pain ?#Uveitis ?Chronic conditions managed with extensive pain regimen and methotrexate once weekly as well as mycophenolate twice daily.  ?-Methotrexate 15 mg  once weekly on Saturday ?-Mycophenolate 1500 mg BID ?-Continue home tramadol 50 mg BID, gabapentin 900 mg daily at bedtime, duloxetine 60 mg BID, cyclobenzaprine 30 mg daily at bedtime ?  ?#HTN ?Managed outpatient with lisinopril and diltiazem.  ?-Continue home lisinopril 20 mg daily and diltiazem 180 mg daily. ?  ?#OSA ?Continue CPAP for sleep. ?  ?#Type 2 diabetes mellitus ?Home regimen includes metformin. He has had some increase in blood glucose levels since starting prednisone for mild COPD exacerbation. ?-Continue metformin 500 mg BID ?-SSI and nighttime correction ?-Consider adding novoLOG 2 units TID meal coverage  ?  ?#Depression ?#Anxiety ?#Insomnia ?Chronically managed with trazodone, buspirone, buproprione, lurasidone. ?-Continue trazodone 75 mg daily at bedtime, buspirone 15 mg daily, buproprione 300 mg daily, lurasidone 40 mg daily ?  ?#BPH ?Managed outpatient with Flomax 0.4 mg daily. ?-Continue Flomax 0.4 mg daily ? ?Best Practice: ?Diet: Cardiac diet ?IVF: None ?VTE: Eliquis ?Code: Full ?AB: Augmentin, azithromycin ?DISPO: Anticipated discharge today or tomorrow to Home pending Medical stability. ? ?Signature: ?Farrel Gordon, D.O.  ?Internal Medicine Resident, PGY-1 ?[facility]  ?Pager: [phone] ?12:03 PM, 05/21/2021  ? ?Please contact the on call pager after 5 pm and on weekends at [phone]. ? ?

## 2021-05-21 NOTE — Progress Notes (Signed)
Paged by RN about chest pain which lasted less than a minute.  Reported 3 out of 10 pressure-like sensation which she described as elephant sitting on his chest.  Evaluated at the bedside and patient states that the chest pain had quickly come and gone when he was moving in the bed.  Denies any other symptoms such as diaphoresis, nausea, vomiting or worsening of his shortness of breath.  He states he still has not had a bowel movement but has not received an enema today.  States he does not want an enema at this late in the evening as he wants to sleep.  Patient also complains of a rash in his groin. ? ?Physical exam ?General-obese gentleman sitting at the side of the bed comfortably resting ?CV-RRR, no murmurs rubs or gallops ?Pulm-on 5 L supplemental oxygen, normal work of breathing ?Skin-erythematous rash in bilateral groins ? ? ?Assessment/plan: ?Unclear etiology of patient's short episode of chest pain which has now completely resolved.  Vitals remained stable.  Low suspicion for ACS.  Will continue to monitor.  Patient's groin rash appears consistent with intertrigo.  Recommended he keep this area dry and can use lotrisone cream.  ? ?-Keep groin region dry, can use small towels and put in between skin folds ?-Lotrisone cream twice daily ? ? ?

## 2021-05-21 NOTE — Progress Notes (Signed)
Inpatient Diabetes Program Recommendations ? ?AACE/ADA: New Consensus Statement on Inpatient Glycemic Control (2015) ? ?Target Ranges:  Prepandial:   less than 140 mg/dL ?     Peak postprandial:   less than 180 mg/dL (1-2 hours) ?     Critically ill patients:  140 - 180 mg/dL  ? ?Lab Results  ?Component Value Date  ? GLUCAP 191 (H) 05/21/2021  ? HGBA1C 7.0 (H) 01/09/2021  ? ? ?Review of Glycemic Control ? Latest Reference Range & Units 05/20/21 12:33 05/20/21 15:52 05/20/21 19:52 05/21/21 07:32  ?Glucose-Capillary 70 - 99 mg/dL 237 (H) 252 (H) 223 (H) 191 (H)  ? ?Diabetes history: DM 2 ?Outpatient Diabetes medications: metformin 500 mg bid ?Current orders for Inpatient glycemic control:  ?Metformin 500 mg bid ?Novolog 0-15 units tid + hs ? ?Ensure enlive tid between meals ? ?Inpatient Diabetes Program Recommendations:   ? ?Due to prednisone dose/supplements, postprandial glucose trends are elevated. Pt may benefit from: ? ?-   Novolog 2 units tid meal coverage if eating >50% of meals ? ?Thanks, ? ?Tama Headings RN, MSN, BC-ADM ?Inpatient Diabetes Coordinator ?Team Pager [phone] (8a-5p) ? ? ?

## 2021-05-21 NOTE — ED Provider Notes (Signed)
?[facility] ?Provider Note ? ? ?CSN: [id number] ?Arrival date & time: 05/21/21  2025 ? ?  ? ?History ?Chief Complaint  ?Patient presents with  ? Shortness of Breath  ? ? ?Mario [name] is a 71 y.o. male with history of non-small cell lung cancer and COPD on 3 L nasal cannula baseline who was recently discharged today for COPD exacerbation presenting because he feels much worse.  Patient reports not being care for himself since he has been home.  Patient states he feels sweaty and very short of breath, but denies any chest pain or worsening leg swelling.  He reports is much worse with exertion and better at rest.  Been unable to lay flat because he feels so short of breath.  He has to sit up on the edge of the bed to feel comfortable.  Patient was found to be 85% on his baseline nasal cannula and therefore placed on a nonrebreather with improvement to 92%.  Cough productive and with yellowish-green sputum. ? ?HPI ? ?  ? ?Home Medications ?Prior to Admission medications   ?Medication Sig Start Date End Date Taking? Authorizing Provider  ?acetaminophen (TYLENOL) 500 MG tablet Take 1,000 mg by mouth every 6 (six) hours as needed for moderate pain.    [provider]  ?albuterol (PROVENTIL) (2.5 MG/3ML) 0.083% nebulizer solution Inhale 3 mLs (2.5 mg total) by nebulization every 4 (four) hours as needed for wheezing or shortness of breath. ?Patient taking differently: Take 2.5 mg by nebulization every 6 (six) hours as needed for wheezing or shortness of breath. 08/04/20 08/04/21  Lavina Hamman, MD  ?albuterol (VENTOLIN HFA) 108 (90 Base) MCG/ACT inhaler Inhale 2 puffs into the lungs every 6 (six) hours as needed for wheezing. 06/02/20   [provider]  ?amoxicillin-clavulanate (AUGMENTIN) 875-125 MG tablet Take 1 tablet by mouth 2 (two) times daily. 05/21/21   Farrel Gordon, DO  ?apixaban (ELIQUIS) 5 MG TABS tablet Take 5 mg by mouth 2 (two) times daily.    [provider]  ?aspirin 81 MG EC tablet Take 81 mg by mouth daily.    [provider]  ?azithromycin (ZITHROMAX) 500 MG tablet Take 1 tablet (500 mg total) by mouth daily for 1 day. Take 1 tablet daily. 05/21/21 05/22/21  Farrel Gordon, DO  ?b complex vitamins capsule Take 1 capsule by mouth daily.    [provider]  ?bisacodyl (DULCOLAX) 5 MG EC tablet Take 1 tablet (5 mg total) by mouth daily at 12 noon. ?Patient taking differently: Take 5 mg by mouth daily. 08/04/20   Lavina Hamman, MD  ?buPROPion (WELLBUTRIN XL) 300 MG 24 hr tablet Take 300 mg by mouth daily. 02/05/20   [provider]  ?busPIRone (BUSPAR) 15 MG tablet Take 15 mg by mouth daily.    [provider]  ?calcium-vitamin D (OSCAL WITH D) 500-200 MG-UNIT tablet Take 1 tablet by mouth 2 (two) times daily with a meal.    [provider]  ?carboxymethylcellulose (REFRESH PLUS) 0.5 % SOLN 1 drop 4 (four) times daily as needed (dry eyes).    [provider]  ?Cholecalciferol (VITAMIN D-3) 25 MCG (1000 UT) CAPS Take 1,000 Units by mouth daily.    [provider]  ?clobetasol ointment (TEMOVATE) 6.19 % Apply 1 application. topically 2 (two) times daily as needed (itching).    [provider]  ?clotrimazole-betamethasone (LOTRISONE) cream Apply topically 2 (two) times daily. 05/21/21   Farrel Gordon,  DO  ?cyclobenzaprine (FLEXERIL) 10 MG tablet Take 10-30 mg by mouth at bedtime. 30mg  at bedtime, 10mg  during the day    [provider]  ?cycloSPORINE (RESTASIS) 0.05 % ophthalmic emulsion Place 1 drop into both eyes 2 (two) times daily.    [provider]  ?diltiazem (CARDIZEM CD) 180 MG 24 hr capsule Take 180 mg by mouth daily.    [provider]  ?docusate sodium (COLACE) 100 MG capsule Take 2 capsules (200 mg total) by mouth 2 (two) times daily. ?Patient taking differently: Take 200 mg by mouth daily. 08/04/20   Lavina Hamman, MD  ?DULoxetine (CYMBALTA) 60 MG capsule  Take 1 capsule (60 mg total) by mouth 2 (two) times daily. 07/21/17   Lavina Hamman, MD  ?feeding supplement (ENSURE ENLIVE / ENSURE PLUS) LIQD Take 237 mLs by mouth 3 (three) times daily between meals. ?Patient taking differently: Take 237 mLs by mouth daily. 08/04/20   Lavina Hamman, MD  ?fluticasone Asencion Islam) 50 MCG/ACT nasal spray Place 2 sprays into both nostrils daily.     [provider]  ?folic acid (FOLVITE) 1 MG tablet Take 1 mg by mouth daily.    [provider]  ?gabapentin (NEURONTIN) 300 MG capsule Take 900 mg by mouth at bedtime.    [provider]  ?GARLIC OIL PO Take 1 capsule by mouth daily.    [provider]  ?guaiFENesin (MUCINEX) 600 MG 12 hr tablet Take 1 tablet (600 mg total) by mouth 2 (two) times daily. 05/21/21   Farrel Gordon, DO  ?lisinopril (ZESTRIL) 20 MG tablet Take 20 mg by mouth daily.    [provider]  ?lurasidone (LATUDA) 40 MG TABS tablet Take 40 mg by mouth daily after supper.    [provider]  ?Melatonin 3 MG TABS Take 6 mg by mouth at bedtime.    [provider]  ?metFORMIN (GLUCOPHAGE) 500 MG tablet Take 500 mg by mouth 2 (two) times daily with a meal.    [provider]  ?methotrexate (RHEUMATREX) 2.5 MG tablet Take 15 mg by mouth every Saturday.    [provider]  ?Mometasone Furoate 200 MCG/ACT AERO Inhale 2 puffs into the lungs daily.    [provider]  ?Multiple Vitamin (MULTIVITAMIN WITH MINERALS) TABS tablet Take 1 tablet by mouth daily.    [provider]  ?mycophenolate (CELLCEPT) 250 MG capsule Take 1,500 mg by mouth 2 (two) times daily.    [provider]  ?nitroGLYCERIN (NITROSTAT) 0.4 MG SL tablet Place 1 tablet (0.4 mg total) under the tongue every 5 (five) minutes as needed for chest pain. 07/03/19   Danford, Suann Larry, MD  ?Olodaterol HCl (STRIVERDI RESPIMAT) 2.5 MCG/ACT AERS Inhale 2 puffs into the lungs daily.    [provider]  ?OVER  THE COUNTER MEDICATION Take 2 each by mouth at bedtime as needed (sleep). Pure Zzz    [provider]  ?oxcarbazepine (TRILEPTAL) 600 MG tablet Take 600-900 mg by mouth See admin instructions. Take 600mg  twice daily and 900mg  at bedtime 07/16/20   [provider]  ?OXYGEN Inhale 3 L into the lungs at bedtime.    [provider]  ?Polyethyl Glycol-Propyl Glycol (SYSTANE OP) Apply 1 drop to eye daily as needed (dry eye).    [provider]  ?polyethylene glycol (MIRALAX / GLYCOLAX) 17 g packet Take 17 g by mouth daily.    [provider]  ?prednisoLONE acetate (PRED FORTE) 1 %  ophthalmic suspension Place 1 drop into the right eye every hour.    [provider]  ?predniSONE (DELTASONE) 20 MG tablet Take 1 tablet (20 mg total) by mouth daily with breakfast. 05/22/21   Farrel Gordon, DO  ?PRESCRIPTION MEDICATION Inhale into the lungs See admin instructions. CPAP- At bedtime and during during any naps    [provider]  ?sodium chloride (OCEAN) 0.65 % SOLN nasal spray Place 2 sprays into both nostrils 4 (four) times daily as needed for congestion.    [provider]  ?tamsulosin (FLOMAX) 0.4 MG CAPS capsule Take 0.4 mg by mouth daily.    [provider]  ?traMADol (ULTRAM) 50 MG tablet Take 100 mg by mouth at bedtime as needed for moderate pain.    [provider]  ?traZODone (DESYREL) 50 MG tablet Take 75 mg by mouth at bedtime.    [provider]  ?VITAMIN A PO Take 1 tablet by mouth daily.    [provider]  ?vitamin C (ASCORBIC ACID) 500 MG tablet Take 500 mg by mouth daily.    [provider]  ?   ? ?Allergies    ?Demerol [meperidine] and Zocor [simvastatin]   ? ?Review of Systems   ?Review of Systems  ?Constitutional:  Positive for fatigue. Negative for fever.  ?Respiratory:  Positive for shortness of breath. Negative for chest tightness.   ?Cardiovascular:  Negative for chest pain and palpitations.   ? ?Physical Exam ?Updated Vital Signs ?BP 129/78   Pulse (!) 113   Resp 18   SpO2 91%  ?Physical Exam ?Constitutional:   ?   General: He is in acute distress.  ?   Appearance: He is ill-appearing.  ?Cardiov

## 2021-05-22 ENCOUNTER — Encounter (HOSPITAL_COMMUNITY): Payer: Self-pay | Admitting: Internal Medicine

## 2021-05-22 LAB — BASIC METABOLIC PANEL
Anion gap: 8 (ref 5–15)
BUN: 15 mg/dL (ref 8–23)
CO2: 29 mmol/L (ref 22–32)
Calcium: 9.6 mg/dL (ref 8.9–10.3)
Chloride: 96 mmol/L — ABNORMAL LOW (ref 98–111)
Creatinine, Ser: 1.22 mg/dL (ref 0.61–1.24)
GFR, Estimated: >60 mL/min (ref >60)
Glucose, Bld: 174 mg/dL — ABNORMAL HIGH (ref 70–99)
Potassium: 4.9 mmol/L (ref 3.5–5.1)
Sodium: 133 mmol/L — ABNORMAL LOW (ref 135–145)

## 2021-05-22 LAB — CBC WITH DIFFERENTIAL/PLATELET
Abs Immature Granulocytes: 0.07 10*3/uL (ref 0.00–0.07)
Basophils Absolute: 0.1 10*3/uL (ref 0.0–0.1)
Basophils Relative: 0 %
Eosinophils Absolute: 0.1 10*3/uL (ref 0.0–0.5)
Eosinophils Relative: 1 %
HCT: 42.7 % (ref 39.0–52.0)
Hemoglobin: 14.6 g/dL (ref 13.0–17.0)
Immature Granulocytes: 1 %
Lymphocytes Relative: 18 %
Lymphs Abs: 2.4 10*3/uL (ref 0.7–4.0)
MCH: 33 pg (ref 26.0–34.0)
MCHC: 34.2 g/dL (ref 30.0–36.0)
MCV: 96.4 fL (ref 80.0–100.0)
Monocytes Absolute: 1.2 10*3/uL — ABNORMAL HIGH (ref 0.1–1.0)
Monocytes Relative: 9 %
Neutro Abs: 9.1 10*3/uL — ABNORMAL HIGH (ref 1.7–7.7)
Neutrophils Relative %: 71 %
Platelets: 347 10*3/uL (ref 150–400)
RBC: 4.43 MIL/uL (ref 4.22–5.81)
RDW: 14.2 % (ref 11.5–15.5)
WBC: 12.8 10*3/uL — ABNORMAL HIGH (ref 4.0–10.5)
nRBC: 0 % (ref 0.0–0.2)

## 2021-05-22 LAB — CBG MONITORING, ED
Glucose-Capillary: 221 mg/dL — ABNORMAL HIGH (ref 70–99)
Glucose-Capillary: 154 mg/dL — ABNORMAL HIGH (ref 70–99)

## 2021-05-22 LAB — GLUCOSE, CAPILLARY
Glucose-Capillary: 181 mg/dL — ABNORMAL HIGH (ref 70–99)
Glucose-Capillary: 200 mg/dL — ABNORMAL HIGH (ref 70–99)

## 2021-05-22 LAB — HEMOGLOBIN A1C
Hgb A1c MFr Bld: 7.6 % — ABNORMAL HIGH (ref 4.8–5.6)
Mean Plasma Glucose: 171.42 mg/dL

## 2021-05-22 LAB — TROPONIN I (HIGH SENSITIVITY): Troponin I (High Sensitivity): 21 ng/L — ABNORMAL HIGH (ref <18)

## 2021-05-22 MED ORDER — IPRATROPIUM-ALBUTEROL 0.5-2.5 (3) MG/3ML IN SOLN: 3.0000 mL | Freq: Four times a day (QID) | RESPIRATORY_TRACT | Status: DC | PRN

## 2021-05-22 MED ORDER — ALBUTEROL SULFATE HFA 108 (90 BASE) MCG/ACT IN AERS
1.0000 | INHALATION_SPRAY | RESPIRATORY_TRACT | Status: DC | PRN
  Administered 2021-05-22: 2 via RESPIRATORY_TRACT
  Filled 2021-05-22: qty 6.7

## 2021-05-22 MED ORDER — IPRATROPIUM-ALBUTEROL 0.5-2.5 (3) MG/3ML IN SOLN
3.0000 mL | Freq: Four times a day (QID) | RESPIRATORY_TRACT | Status: DC | PRN
  Administered 2021-05-22: 3 mL via RESPIRATORY_TRACT

## 2021-05-22 MED ORDER — FUROSEMIDE 10 MG/ML IJ SOLN
20.0000 mg | Freq: Once | INTRAMUSCULAR | Status: AC
  Administered 2021-05-22: 20 mg via INTRAVENOUS
  Filled 2021-05-22: qty 2

## 2021-05-22 MED ORDER — INSULIN ASPART 100 UNIT/ML IJ SOLN
0.0000 [IU] | Freq: Three times a day (TID) | INTRAMUSCULAR | Status: DC
  Administered 2021-05-22: 5 [IU] via SUBCUTANEOUS
  Administered 2021-05-22 – 2021-05-23 (×3): 3 [IU] via SUBCUTANEOUS
  Administered 2021-05-23: 2 [IU] via SUBCUTANEOUS
  Administered 2021-05-23 – 2021-05-24 (×2): 3 [IU] via SUBCUTANEOUS
  Administered 2021-05-24: 2 [IU] via SUBCUTANEOUS

## 2021-05-22 MED ORDER — PREDNISOLONE ACETATE 1 % OP SUSP
1.0000 [drp] | OPHTHALMIC | Status: DC
  Administered 2021-05-22 – 2021-05-24 (×43): 1 [drp] via OPHTHALMIC
  Filled 2021-05-22: qty 5

## 2021-05-22 NOTE — ED Provider Notes (Signed)
Supervised resident visit.  Patient discharged yesterday for COPD exacerbation.  Has not felt better since being home.  History of COPD and lung cancer.  He has 3 L of oxygen use chronically but having to wear increased oxygen at home.  Diminished breath sounds.  Increased work of breathing.  Overall differential diagnosis is COPD exacerbation/pneumonia versus less likely heart failure.  Denies any chest pain.  Overall suspect may be discharged too early.  He has been on steroids and breathing treatments at home without much improvement.  He is been tachycardic here.  EKG shows sinus tachycardia.  No ischemic changes.  We will get CBC, BMP, chest x-ray.  Will give breathing treatment, steroids and overall anticipate readmission. ? ?Chest x-ray shows persistent left lower lobe consolidation.  Troponin 15 and 21.  No significant leukocytosis or anemia or electrolyte abnormality otherwise.  Overall given increasing oxygen requirement and continued COPD symptoms will admit for further care.  Hemodynamically stable throughout my care. ? ?This chart was dictated using voice recognition software.  Despite best efforts to proofread,  errors can occur which can change the documentation meaning.  ?  Lennice Sites, DO ?05/22/21 0014 ? ?

## 2021-05-22 NOTE — Progress Notes (Signed)
? ?Subjective:  ? ?Hospital day:1 ? ?Overnight event: No acute events overnight ? ?Interim History: Patient seen in the ER sitting in bed. Patient reports he had his O2 on 3L at home but could not catch his breath when ambulating or exerting himself so ge called EMS.  He is comfortable on 4 L at rest at the moment but states he will get out of breath easily if he starts moving.  He also endorse a productive Cough of dark/greenish sputum but denies any fevers, chills or chest pain.  ? ?Objective: ? ?Vital signs in last 24 hours: ?Vitals:  ? 05/22/21 0630 05/22/21 0730 05/22/21 0750 05/22/21 0753  ?BP: 119/81 112/78    ?Pulse: 96 90    ?Resp: 18 20    ?SpO2: 90% 90% 90% 92%  ? ? ?There were no vitals filed for this visit. ? ?No intake or output data in the 24 hours ending 05/22/21 0825 ?Net IO Since Admission: No IO data has been entered for this period [05/22/21 0825] ? ?Recent Labs  ?  05/21/21 ?0732 05/21/21 ?1155 05/21/21 ?1620  ?GLUCAP 191* 195* 269*  ?  ? ?Pertinent Labs: ? ?  Latest Ref Rng & Units 05/22/2021  ?  3:35 AM 05/21/2021  ?  9:04 PM 05/21/2021  ?  6:44 AM  ?CBC  ?WBC 4.0 - 10.5 K/uL 12.8   12.6   11.9    ?Hemoglobin 13.0 - 17.0 g/dL 14.6   15.0   13.9    ?Hematocrit 39.0 - 52.0 % 42.7   44.1   41.6    ?Platelets 150 - 400 K/uL 347   367   300    ? ? ? ?  Latest Ref Rng & Units 05/22/2021  ?  3:35 AM 05/21/2021  ?  9:04 PM 05/21/2021  ?  6:44 AM  ?CMP  ?Glucose 70 - 99 mg/dL 174   148   193    ?BUN 8 - 23 mg/dL 15   18   14     ?Creatinine 0.61 - 1.24 mg/dL 1.22   1.14   0.92    ?Sodium 135 - 145 mmol/L 133   129   132    ?Potassium 3.5 - 5.1 mmol/L 4.9   4.7   4.5    ?Chloride 98 - 111 mmol/L 96   95   97    ?CO2 22 - 32 mmol/L 29   25   27     ?Calcium 8.9 - 10.3 mg/dL 9.6   9.2   8.8    ? ? ?Imaging: ?DG Chest Port 1 View ? ?Result Date: 05/21/2021 ?CLINICAL DATA:  Shortness of breath, hypoxia, COPD and history of lung cancer. EXAM: PORTABLE CHEST 1 VIEW COMPARISON:  CTA chest and portable chest both  05/19/2021. FINDINGS: The cardiac size is normal. There is a stable mediastinum with aortic atherosclerosis. Small left pleural effusion and dense left lower lobe consolidation continues to be seen, without significant change. There is obscuration of the known left lower lobe small mass seen on chest CT of 02/03/2021. The lungs emphysematous and otherwise clear. The right sulci are sharp. No new or worsening abnormality is seen. Osteopenia and thoracic spondylosis. IMPRESSION: Persistent left lower lobe consolidation and small left pleural effusion. The known left lower lobe solid mass seen on prior studies is not visualized separate from this. No new or worsening infiltrate is seen. COPD. Electronically Signed   By: Telford Nab M.D.   On: 05/21/2021 20:48   ? ?  Physical Exam ? ?General: Pleasant, obese elderly male sitting in bed. No acute distress. ?CV: Tachycardic.  Regular rhythm, No m/r/g.  Trace BLE edema ?Pulmonary: On 4L [place]. Lungs CTAB. Normal effort. Distant rales on the left base. Decreased air movement. No wheezing or rhonchi. ?Abdominal: Soft, nontender. Mildly distended.  Normal bowel sounds. ?Extremities: Radial and DP pulses 2+ and symmetric.  Normal ROM. ?Skin: Warm and dry. No obvious rash or lesions. ?Neuro: A&Ox3. Moves all extremities. Normal sensation to gross touch.  ?Psych: Normal mood and affect ? ? ?Assessment/Plan: ?RIAZ ONORATO is a 71 y.o. 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  ? ?Principal Problem: ?  Acute hypoxemic respiratory failure ([place]) ?Active Problems: ?  COPD exacerbation ([place]) ?  CAP (community acquired pneumonia) ?  Non-small cell cancer of left lung  ([place]) ? ?Acute on Chronic Hypoxic Respiratory Failure ?COPD exacerbation  ?CAP ?Hx of [place] Lung Cancer Stage II s/p radiation ?Patient was recently admitted from 3/22-3/24 for acute on chronic hypoxic respiratory failure in the setting of CAP and COPD exacerbation. Patient readmitted due to dyspnea on exertion while on 3 L [place] at home. He remains tachycardic and mildly tachypneic on exam. Still on 4 L [place] with O2 sats 89% to 92%. Gram stain of sputum culture shows rare gram+ cocci. Mild leukocytosis of 12.8 the patient remains afebrile. Repeat CXR shows persistent LLL consolidation and small left pleural effusion but no new or worsening infiltrate. Patient desatting on 3 L [place] due to progressive and severe emphysema. Patient would likely benefit from Trelegy and pulmonary rehab. O2 need will likely increase to 4 L  at discharge. ?--Continue O2 supplementation, wean down with goal of 88%--92% ?--Pulse ox with ambulation tomorrow ?--IV lasix 20 mg x1 dose ?--Continue Augmentin and azithromycin ?--Continue prednisone 20 mg daily ?--Continue albuterol inhaler, Durela and DuoNebs ?--Continue as needed guaifenesin for cough ?--Patient sees [place] pulmonologist, will likely need pulmonary rehab and Trelegy ?  ?Sinus tachycardia ?Likely in the setting of acute on chronic hypoxic respiratory failure. Patient reports his heart rate ranged from the 90s to 100s at home at baseline. ?--Continue telemetry ?  ?Hyponatremia ?Sodium of 129 on admission improved to 133 today. Possibly an SIADH picture in the setting of lung disease. ?--Daily BMP ?  ?Ankylosing Spondylitis ?Uveitis ?-Continue home methotrexate and mycophenolate ? ?Chronic Back Pain ?-Continue home pain regimen of tramadol, gabapentin, duloxetine, cyclobenzaprine  ?  ?HTN ?BP stable with SBP in the 110s to 130s. ?-Continue lisinopril and diltiazem.  ?  ?OSA ?--Continue CPAP at night ? ?Type 2 diabetes mellitus ?A1c of 7.6%. Blood sugar of 221 this morning. ?-Continue home  metformin ?-SSI ?  ?Depression ?Anxiety ?Insomnia ?-Continue home trazodone, buspirone, buproprione, lurasidone. ?  ?BPH ?-Continue home Flomax.  ? ?Diet: Carb modified ?IVF: None ?VTE: Eliquis ?CODE: Full ? ?Prior to Admission Living Arrangement: Home ?Anticipated Discharge Location: Home with home health ?Barriers to Discharge: Medical stability ?Dispo: Anticipated discharge in approximately 1-2 day(s).  ? ?Signed: ?Lacinda Axon, MD ?05/22/2021, 8:25 AM  ?Pager: [phone] ?Internal Medicine Teaching Service ?After 5pm on weekdays and 1pm on weekends: On Call pager: [phone] ? ?

## 2021-05-23 DIAGNOSIS — J9601 Acute respiratory failure with hypoxia: Secondary | ICD-10-CM | POA: Diagnosis not present

## 2021-05-23 DIAGNOSIS — J440 Chronic obstructive pulmonary disease with acute lower respiratory infection: Secondary | ICD-10-CM | POA: Diagnosis not present

## 2021-05-23 DIAGNOSIS — J189 Pneumonia, unspecified organism: Secondary | ICD-10-CM

## 2021-05-23 DIAGNOSIS — E871 Hypo-osmolality and hyponatremia: Secondary | ICD-10-CM | POA: Diagnosis not present

## 2021-05-23 DIAGNOSIS — J441 Chronic obstructive pulmonary disease with (acute) exacerbation: Secondary | ICD-10-CM

## 2021-05-23 DIAGNOSIS — I10 Essential (primary) hypertension: Secondary | ICD-10-CM | POA: Diagnosis not present

## 2021-05-23 LAB — CULTURE, RESPIRATORY W GRAM STAIN
Culture: NORMAL
Gram Stain: NONE SEEN

## 2021-05-23 LAB — GLUCOSE, CAPILLARY
Glucose-Capillary: 176 mg/dL — ABNORMAL HIGH (ref 70–99)
Glucose-Capillary: 144 mg/dL — ABNORMAL HIGH (ref 70–99)
Glucose-Capillary: 194 mg/dL — ABNORMAL HIGH (ref 70–99)
Glucose-Capillary: 144 mg/dL — ABNORMAL HIGH (ref 70–99)

## 2021-05-23 MED ORDER — ONDANSETRON 4 MG PO TBDP
4.0000 mg | ORAL_TABLET | Freq: Four times a day (QID) | ORAL | Status: DC
  Administered 2021-05-23 – 2021-05-24 (×2): 4 mg via ORAL
  Filled 2021-05-23 (×2): qty 1

## 2021-05-23 MED ORDER — ONDANSETRON HCL 4 MG/2ML IJ SOLN: 4.0000 mg | Freq: Four times a day (QID) | INTRAMUSCULAR | Status: DC

## 2021-05-23 NOTE — TOC Progression Note (Signed)
Transition of Care (TOC) - Progression Note  ? ? ?Patient Details  ?Name: ROMELLO HOEHN ?MRN: [id number] ?Date of Birth: 18-Apr-1950 ? ?Transition of Care (TOC) CM/SW Contact  ?Bartholomew Crews, RN ?Phone Number: [phone] ?05/23/2021, 10:09 AM ? ?Clinical Narrative:    ? ?Spoke with patient at the bedside to discuss post acute transition. PTA home with roommate who assists with [place]. Has home oxygen from [place] through [place]. Followed by [facility]. Will need medical transportation d/t oxygen for transition home - states that he has [employer] insurance for medical transportation. He usess [facility] transportation to get to medical appointments. His choice for [facility] agency is [facility] if [facility] services needed. Medications filled via [place] - no part D at this time, but patient stated that he will have [facility] in April. TOC following for transition needs.  ? ?  ?  ? ?Expected Discharge Plan and Services ?  ?  ?  ?  ?  ?                ?  ?  ?  ?  ?  ?  ?  ?  ?  ?  ? ? ?Social Determinants of Health (SDOH) Interventions ?  ? ?Readmission Risk Interventions ?   ? View : No data to display.  ?  ?  ?  ? ? ?

## 2021-05-23 NOTE — Progress Notes (Signed)
? ?Subjective:  ? ?Hospital day:2 ? ?Overnight event: No acute events overnight ? ?Interim History:  ?Morning: He feels short of breath when ambulating to the bathroom. Has not coughed up any sputum since yesterday. Denies any fevers, chills, chest pain. Had a bowel movement this morning.  ? ?Afternoon: Patient reported to have an episode of chest pain by RN while ambulating to the bathroom of his oxygen.  Chest pain resolved without any intervention after patient got back to his bed and put on his oxygen.  He denied any shortness of breath or dizziness.  Continues to endorse shortness of breath with exertion. ? ?Objective: ? ?Vital signs in last 24 hours: ?Vitals:  ? 05/22/21 1955 05/22/21 2239 05/23/21 0017 05/23/21 0435  ?BP: 122/77  (!) 141/65 121/76  ?Pulse: 100 82 99 96  ?Resp: 19 18 18 18   ?Temp:      ?TempSrc:      ?SpO2: 93% 91% 95% 93%  ?Weight:  (!) 147 kg    ?Height:  6\' 2"  (1.88 m)    ? ? ?Filed Weights  ? 05/22/21 2239  ?Weight: (!) 147 kg  ? ? ? ?Intake/Output Summary (Last 24 hours) at 05/23/2021 0634 ?Last data filed at 05/23/2021 0300 ?Gross per 24 hour  ?Intake --  ?Output 550 ml  ?Net -550 ml  ? ?Net IO Since Admission: -550 mL [05/23/21 0634] ? ?Recent Labs  ?  05/22/21 ?1204 05/22/21 ?1638 05/22/21 ?1952  ?GLUCAP 154* 181* 200*  ?  ? ?Pertinent Labs: ? ?  Latest Ref Rng & Units 05/22/2021  ?  3:35 AM 05/21/2021  ?  9:04 PM 05/21/2021  ?  6:44 AM  ?CBC  ?WBC 4.0 - 10.5 K/uL 12.8   12.6   11.9    ?Hemoglobin 13.0 - 17.0 g/dL 14.6   15.0   13.9    ?Hematocrit 39.0 - 52.0 % 42.7   44.1   41.6    ?Platelets 150 - 400 K/uL 347   367   300    ? ? ? ?  Latest Ref Rng & Units 05/22/2021  ?  3:35 AM 05/21/2021  ?  9:04 PM 05/21/2021  ?  6:44 AM  ?CMP  ?Glucose 70 - 99 mg/dL 174   148   193    ?BUN 8 - 23 mg/dL 15   18   14     ?Creatinine 0.61 - 1.24 mg/dL 1.22   1.14   0.92    ?Sodium 135 - 145 mmol/L 133   129   132    ?Potassium 3.5 - 5.1 mmol/L 4.9   4.7   4.5    ?Chloride 98 - 111 mmol/L 96   95   97     ?CO2 22 - 32 mmol/L 29   25   27     ?Calcium 8.9 - 10.3 mg/dL 9.6   9.2   8.8    ? ? ?Imaging: ?No results found. ? ?Physical Exam ? ?General: Pleasant, obese elderly male sitting in bed. No acute distress. ?CV: Tachycardic. Regular rhythm, No m/r/g.  Trace BLE edema ?Pulmonary: On 5L [place]. Lungs CTAB. Normal effort. No wheezing, rhonchi or rales. Decreased air movement. ?Abdominal: Soft, nontender. Mildly distended.  Normal bowel sounds. ?Extremities: Radial and DP pulses 2+ and symmetric.  Normal ROM. ?Skin: Warm and dry. No obvious rash or lesions. ?Neuro: A&Ox3. Moves all extremities. Normal sensation to gross touch.  ?Psych: Normal mood and affect ? ?Assessment/Plan: ?ALEXXANDER KURT is  a 71 y.o. 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  ? ?Principal Problem: ?  Acute hypoxemic respiratory failure ([place]) ?Active Problems: ?  COPD exacerbation ([place]) ?  CAP (community acquired pneumonia) ?  Non-small cell cancer of left lung ([place]) ? ?Acute on Chronic Hypoxic Respiratory Failure ?COPD exacerbation  ?CAP ?Hx of [place] Lung Cancer Stage II s/p radiation ?Patient was recently admitted from 3/22-3/24 for acute on chronic hypoxic respiratory failure in the setting of CAP and COPD exacerbation. Patient readmitted due to dyspnea on exertion while on 3 L [place] at home.  Repeat pulse ox with ambulation shows patient able to maintain O2 sats around 92% on 3 L.  Patient still complains of dyspnea on exertion and had an episode of chest pain while off his oxygen. Chest pain resolved without intervention. Cough has improved. Unfortunately, pt does not have continuous home oxygen at home as well as oxygen supplies for safe  discharge.  Respiratory culture shows rare gram-positive cocci, pending culture. ?--New DME order for continuous O2 at [facility] ?--Continue Augmentin and azithromycin (Day 5 of abx) ?--Continue prednisone 20 mg daily ?--Continue albuterol inhaler, Durela and DuoNebs ?--Continue as needed guaifenesin for cough ?--Patient sees [place] pulmonologist, will likely need pulmonary rehab and Trelegy ?--Pending discharge home with home health tomorrow ?  ?Sinus tachycardia ?Chest pain, resolved ?Likely in the setting of acute on chronic hypoxic respiratory failure. Patient reports his heart rate ranged from the 90s to 100s at home at baseline. HR remains in the 90s to 100s today. EKG showed sinus rhythm with first-degree AV block with rates in the 90s. ?--Continue telemetry ?  ?Hyponatremia ?Sodium of 129 on admission, improved to 133. ?--Daily BMP ?  ?Ankylosing Spondylitis ?Uveitis ?-Continue home methotrexate and mycophenolate ? ?Chronic Back Pain ?-Continue home pain regimen of tramadol, gabapentin, duloxetine, cyclobenzaprine  ?  ?HTN ?BP in the 120s to 150s. ?-Continue lisinopril and diltiazem.  ?  ?OSA ?--Continue CPAP at night ? ?Type 2 diabetes mellitus ?A1c of 7.6%.  Blood sugar in the 140s to 170s overnight. ?-Continue home metformin ?-SSI ?  ?Depression ?Anxiety ?Insomnia ?-Continue home trazodone, buspirone, buproprione, lurasidone. ?  ?BPH ?-Continue home Flomax.  ? ?Diet: Carb modified ?IVF: None ?VTE: Eliquis ?CODE: Full ? ?Prior to Admission Living Arrangement: Home ?Anticipated Discharge Location: Home with home health ?Barriers to Discharge: Medical stability ?Dispo: Anticipated discharge tomorrow ?Signed: ?Lacinda Axon, MD ?05/23/2021, 6:34 AM  ?Pager: [phone] ?Internal Medicine Teaching Service ?After 5pm on weekdays and 1pm on weekends: On Call pager: [phone] ? ?

## 2021-05-23 NOTE — Progress Notes (Signed)
SATURATION QUALIFICATIONS: (This note is used to comply with regulatory documentation for home oxygen) ? ?Patient Saturations on Room Air at Rest = 95% ? ?Patient Saturations on Room Air while Ambulating = 87% ? ?Patient Saturations on 3 Liters of oxygen while Ambulating = 92% ? ?Please briefly explain why patient needs home oxygen: ?Pt. Desats  to 87% on room air while ambulating. ?

## 2021-05-23 NOTE — Progress Notes (Signed)
Pt. Complaint of (mid chest pain while using the restroom), pt . " Described the chest pain as elephants crushing his chest", MD aware. Pt on tele. Per pt., pain subsided as he went back and sat EOB.  ?

## 2021-05-23 NOTE — Care Management Obs Status (Signed)
MEDICARE OBSERVATION STATUS NOTIFICATION ? ? ?Patient Details  ?Name: Mario [name] ?MRN: [id number] ?Date of Birth: 07/03/1950 ? ? ?Medicare Observation Status Notification Given:  Yes ? ? ? ?Bartholomew Crews, RN ?05/23/2021, 10:08 AM ?

## 2021-05-23 NOTE — Plan of Care (Signed)
?  Problem: Health Behavior/Discharge Planning: ?Goal: Ability to manage health-related needs will improve ?Outcome: Progressing ?  ?Problem: Clinical Measurements: ?Goal: Will remain free from infection ?Outcome: Progressing ?Goal: Respiratory complications will improve ?Outcome: Progressing ?  ?

## 2021-05-23 NOTE — TOC Initial Note (Addendum)
Transition of Care (TOC) - Initial/Assessment Note  ? ? ?Patient Details  ?Name: Mario [name] ?MRN: [id number] ?Date of Birth: [birth year] ? ?Transition of Care (TOC) CM/SW Contact:    ?Bartholomew Crews, RN ?Phone Number: [phone] ?05/23/2021, 2:06 PM ? ?Clinical Narrative:                 ? ?Spoke to patient at the bedside this afternoon to discuss post acute transition. Patient has home oxygen for night time use with his cpap. Current medical necessity demonstrates that patient needs continuous O2. Patient does not have tubing at home that allows him to get around house with oxygen on and does not have enough portable oxygen to get through the night. Spoke with Aaron Edelman at [place] on speakerphone with patient to confirm that patient has oxygen for nighttime use and not continuous. Patient is schduled for visit from [facility] tomorrow. [facility] needs an updated DME order for home oxygen with medical necessity, and referral must come from [place]. Message sent to MD to advise.  ? ?Notifed [facility] 72 hour hotline to notify of patient current observation status. Referral ID# [id number].  ? ?DME order for home oxygen and clinical notes sent to Dr. Radford Pax and Juliann Pulse at [facility] [phone].  ? ?Katina at [facility] notified of patient's readmission.  ? ?TOC following for transition needs.  ? ?Expected Discharge Plan: [place] ?  ? ? ?Patient Goals and CMS Choice ?  ?CMS Medicare.gov Compare Post Acute Care list provided to:: Patient ?Choice offered to / list presented to : Patient ? ?Expected Discharge Plan and Services ?Expected Discharge Plan: [place] ?  ?Discharge Planning Services: CM Consult ?Post Acute Care Choice: Durable Medical Equipment, Home Health ?Living arrangements for the past 2 months: Apartment ?                ?DME Arranged: Oxygen ?DME Agency: [place] [phone]) ?Date DME Agency Contacted: 05/23/21 ?Time DME Agency  Contacted: [phone] ?Representative spoke with at DME Agency: Aaron Edelman ?HH Arranged: PT ?[place] Agency: [place] ?Date HH Agency Contacted: 05/23/21 ?Time [place]: 2122 ?Representative spoke with at [place]: Alwyn Ren ? ?Prior Living Arrangements/Services ?Living arrangements for the past 2 months: Apartment ?Lives with:: Self, Roommate ?Patient language and need for interpreter reviewed:: Yes ?       ?  ?  ?Current home services: DME ?Criminal Activity/Legal Involvement Pertinent to Current Situation/Hospitalization: No - Comment as needed ? ?Activities of Daily Living ?Home Assistive Devices/Equipment: Other (Comment) (cane) ?ADL Screening (condition at time of admission) ?Patient's cognitive ability adequate to safely complete daily activities?: Yes ?Is the patient deaf or have difficulty hearing?: No ?Does the patient have difficulty seeing, even when wearing glasses/contacts?: No ?Does the patient have difficulty concentrating, remembering, or making decisions?: No ?Patient able to express need for assistance with ADLs?: No ?Does the patient have difficulty dressing or bathing?: No ?Independently performs ADLs?: Yes (appropriate for developmental age) ?Does the patient have difficulty walking or climbing stairs?: Yes ?Weakness of Legs: None ?Weakness of Arms/Hands: None ? ?Permission Sought/Granted ?  ?  ?   ?   ?   ?   ? ?Emotional Assessment ?Appearance:: Appears stated age ?Attitude/Demeanor/Rapport: Engaged ?Affect (typically observed): Accepting ?Orientation: : Oriented to Self, Oriented to Place, Oriented to  Time, Oriented to Situation ?Alcohol / Substance Use: Not Applicable ?Psych Involvement: No (comment) ? ?Admission diagnosis:  COPD exacerbation ([place]) [J44.1] ?Acute  respiratory failure with hypoxia ([place]) [J96.01] ?Acute hypoxemic respiratory failure ([place]) [J96.01] ?Patient Active Problem List  ? Diagnosis Date Noted  ? Acute hypoxemic respiratory failure ([place]) 05/21/2021  ?  Hyponatremia 05/20/2021  ? Acute pulmonary embolism without acute cor pulmonale ([place]) 02/03/2021  ? Mood disorder ([place]) 02/03/2021  ? Acute on chronic respiratory failure ([place]) 01/09/2021  ? Non-small cell cancer of left lung ([place]) 09/21/2020  ? Lung nodule 07/30/2020  ? Mass of left lung   ? Acute respiratory failure with hypoxemia ([place]) 07/26/2020  ? Constipation   ? CAP (community acquired pneumonia) 04/03/2020  ? COPD with acute exacerbation ([place]) 01/05/2020  ? Vertigo 12/26/2019  ? 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 Stage 3a chronic kidney disease ([place]) 12/26/2019  ? Abnormal CT of the chest 12/26/2019  ? COPD (chronic obstructive pulmonary disease) ([place])   ? AKI (acute kidney injury) ([place])   ? Hyperglycemia 06/23/2019  ? OSA (obstructive sleep apnea) 06/23/2019  ? Suicidal ideation 06/23/2019  ? Major depressive disorder, recurrent episode ([place]) 10/12/2018  ? 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 Cellulitis of both feet 07/16/2017  ? DM2 (diabetes mellitus, type 2) ([place]) 07/16/2017  ? HLA B27 (HLA B27 positive) 07/16/2017  ? Acute chest pain   ? Hyperkalemia   ? Spontaneous pneumothorax 06/27/2016  ? Hypoxia   ? Pneumothorax on left   ? Dyspnea 05/23/2014  ? COPD exacerbation ([place]) 05/23/2014  ? Malingering 01/21/2013  ? Depression 01/11/2013  ? Tobacco abuse 01/11/2013  ? Obesity, unspecified 01/11/2013  ? Chest pain 01/10/2013  ? Hypertension associated with diabetes ([place]) 01/10/2013  ? ?PCP:  [facility] ?Pharmacy:   ?[facility] ?[phone] ?[place] [address] ?Phone: [phone] Fax: [phone] ? ?[facility] ?[address] ?[place] [address] ?Phone: [phone] Fax: [phone] ? ?[id number] [facility] [address]  [address]?Phone: [phone]: [phone]? ?[facility] ?[address]?Phone: [phone]: [phone] ? ? ? ? ?Social Determinants of Health (SDOH) Interventions ?  ? ?Readmission Risk Interventions ?   ? View : No data to display.  ?  ?  ?  ? ? ? ?

## 2021-05-23 NOTE — Evaluation (Signed)
Physical Therapy Evaluation ?Patient Details ?Name: Mario [name] ?MRN: [id number] ?DOB: [birth year] ?Today's Date: 05/23/2021 ? ?History of Present Illness ? Pt is a 71 y/o male admitted secondary to SOB. Pt found to have acute on chronic Hypoxic Respiratory Failure and COPD exacerbation. PMH including but not limited to [facility] Lung Cancer Stage II s/p radiation, COPD and HTN. ?  ?Clinical Impression ? Pt presented sitting upright at EOB, awake and willing to participate in therapy session. Prior to admission, pt reported that he ambulated short distances within his home without an AD and was independent with ADLs. He reported that he uses disability transportation services to go to and from doctor appointments, and typically uses a delivery service for food/groceries. Pt lives with a roommate in a single level home with several steps to enter (with rail). At the time of evaluation, pt able to complete transfers with supervision and ambulate within his room without an AD with min guard for safety. Pt would continue to benefit from skilled physical therapy services at this time while admitted and after d/c to address the below listed limitations in order to improve overall safety and independence with functional mobility. ? ?   ? ?Recommendations for follow up therapy are one component of a multi-disciplinary discharge planning process, led by the attending physician.  Recommendations may be updated based on patient status, additional functional criteria and insurance authorization. ? ?Follow Up Recommendations Home health PT ? ?  ?Assistance Recommended at Discharge PRN  ?Patient can return home with the following ? Assist for transportation ? ?  ?Equipment Recommendations None recommended by PT  ?Recommendations for Other Services ?    ?  ?Functional Status Assessment Patient has had a recent decline in their functional status and demonstrates the ability to make significant improvements in function in a reasonable  and predictable amount of time.  ? ?  ?Precautions / Restrictions Precautions ?Precautions: Fall ?Precaution Comments: needs supplemental O2 ?Restrictions ?Weight Bearing Restrictions: No  ? ?  ? ?Mobility ? Bed Mobility ?  ?  ?  ?  ?  ?  ?  ?General bed mobility comments: pt sitting upright at EOB upon PT arrival ?  ? ?Transfers ?Overall transfer level: Needs assistance ?Equipment used: None ?Transfers: Sit to/from Stand ?Sit to Stand: Supervision ?  ?  ?  ?  ?  ?General transfer comment: pt able to complete sit<>stand from EOB x1 and from toilet x1 with supervision for safety ?  ? ?Ambulation/Gait ?Ambulation/Gait assistance: Min guard ?Gait Distance (Feet): 30 Feet (15' x2 (to and from bathroom)) ?Assistive device: None ?Gait Pattern/deviations: Decreased step length - right, Decreased stride length ?Gait velocity: decreased ?  ?  ?General Gait Details: pt with slow, steady gait within his room to and from the bathroom with close min guard from PT for safety ? ?Stairs ?  ?  ?  ?  ?  ? ?Wheelchair Mobility ?  ? ?Modified Rankin (Stroke Patients Only) ?  ? ?  ? ?Balance Overall balance assessment: Needs assistance ?Sitting-balance support: No upper extremity supported ?Sitting balance-Leahy Scale: Good ?  ?  ?Standing balance support: During functional activity, No upper extremity supported ?Standing balance-Leahy Scale: Fair ?  ?  ?  ?  ?  ?  ?  ?  ?  ?  ?  ?  ?   ? ? ? ?Pertinent Vitals/Pain Pain Assessment ?Pain Assessment: Faces ?Faces Pain Scale: Hurts a little bit ?Pain Location: chronic back pain ?Pain Descriptors /  Indicators: Discomfort ?Pain Intervention(s): Monitored during session  ? ? ?Home Living Family/patient expects to be discharged to:: Private residence ?Living Arrangements: Non-relatives/Friends ?Available Help at Discharge: Friend(s);Available PRN/intermittently ?Type of Home: Apartment ?Home Access: Stairs to enter ?Entrance Stairs-Rails: Right ?Entrance Stairs-Number of Steps: 5 ?  ?Home  Layout: One level ?Home Equipment: Kasandra Knudsen - single point;Shower seat ?Additional Comments: wears 3 L O2 with activity and sleep  ?  ?Prior Function Prior Level of Function : Independent/Modified Independent ?  ?  ?  ?  ?  ?  ?Mobility Comments: ambulates within his home without an AD; typically utilizes a w/c when at dr's appointments; otherwise does not leave his home ?ADLs Comments: independent with ADLs and IADLs; reports sits to shower, easily fatigues ?  ? ? ?Hand Dominance  ?   ? ?  ?Extremity/Trunk Assessment  ? Upper Extremity Assessment ?Upper Extremity Assessment: Generalized weakness ?  ? ?Lower Extremity Assessment ?Lower Extremity Assessment: Generalized weakness ?  ? ?Cervical / Trunk Assessment ?Cervical / Trunk Assessment: Other exceptions ?Cervical / Trunk Exceptions: hx of back pain  ?Communication  ? Communication: No difficulties  ?Cognition Arousal/Alertness: Awake/alert ?Behavior During Therapy: [facility] for tasks assessed/performed ?Overall Cognitive Status: Within Functional Limits for tasks assessed ?  ?  ?  ?  ?  ?  ?  ?  ?  ?  ?  ?  ?  ?  ?  ?  ?  ?  ?  ? ?  ?General Comments   ? ?  ?Exercises    ? ?Assessment/Plan  ?  ?PT Assessment Patient needs continued PT services  ?PT Problem List Decreased strength;Decreased activity tolerance;Decreased mobility;Cardiopulmonary status limiting activity ? ?   ?  ?PT Treatment Interventions Gait training;DME instruction;Stair training;Functional mobility training;Therapeutic activities;Therapeutic exercise;Balance training;Neuromuscular re-education;Patient/family education   ? ?PT Goals (Current goals can be found in the Care Plan section)  ?Acute Rehab PT Goals ?Patient Stated Goal: for his breathing to improve ?PT Goal Formulation: With patient ?Time For Goal Achievement: 06/06/21 ?Potential to Achieve Goals: Good ? ?  ?Frequency Min 3X/week ?  ? ? ?Co-evaluation   ?  ?  ?  ?  ? ? ?  ?AM-PAC PT "6 Clicks" Mobility  ?Outcome Measure Help needed turning  from your back to your side while in a flat bed without using bedrails?: None ?Help needed moving from lying on your back to sitting on the side of a flat bed without using bedrails?: None ?Help needed moving to and from a bed to a chair (including a wheelchair)?: A Little ?Help needed standing up from a chair using your arms (e.g., wheelchair or bedside chair)?: None ?Help needed to walk in hospital room?: A Little ?Help needed climbing 3-5 steps with a railing? : A Little ?6 Click Score: 21 ? ?  ?End of Session Equipment Utilized During Treatment: Oxygen (3L) ?Activity Tolerance: Patient tolerated treatment well ?Patient left: with call bell/phone within reach;Other (comment) (sitting up at EOB) ?Nurse Communication: Mobility status ?PT Visit Diagnosis: Other abnormalities of gait and mobility (R26.89) ?  ? ?Time: 6073-7106 ?PT Time Calculation (min) (ACUTE ONLY): 29 min ? ? ?Charges:   PT Evaluation ?$PT Eval Moderate Complexity: 1 Mod ?PT Treatments ?$Therapeutic Activity: 8-22 mins ?  ?   ? ? ?Mario [name], PT, DPT  ?Acute Rehabilitation Services ?Office [phone] ? ? ?Clearnce Sorrel [place] ?05/23/2021, 2:01 PM ? ?

## 2021-05-24 DIAGNOSIS — J441 Chronic obstructive pulmonary disease with (acute) exacerbation: Secondary | ICD-10-CM | POA: Diagnosis not present

## 2021-05-24 DIAGNOSIS — J9601 Acute respiratory failure with hypoxia: Secondary | ICD-10-CM | POA: Diagnosis not present

## 2021-05-24 DIAGNOSIS — J440 Chronic obstructive pulmonary disease with acute lower respiratory infection: Secondary | ICD-10-CM | POA: Diagnosis not present

## 2021-05-24 DIAGNOSIS — J189 Pneumonia, unspecified organism: Secondary | ICD-10-CM | POA: Diagnosis not present

## 2021-05-24 LAB — CBC
HCT: 39.5 % (ref 39.0–52.0)
Hemoglobin: 13.4 g/dL (ref 13.0–17.0)
MCH: 32.4 pg (ref 26.0–34.0)
MCHC: 33.9 g/dL (ref 30.0–36.0)
MCV: 95.4 fL (ref 80.0–100.0)
Platelets: 330 10*3/uL (ref 150–400)
RBC: 4.14 MIL/uL — ABNORMAL LOW (ref 4.22–5.81)
RDW: 13.7 % (ref 11.5–15.5)
WBC: 11.4 10*3/uL — ABNORMAL HIGH (ref 4.0–10.5)
nRBC: 0 % (ref 0.0–0.2)

## 2021-05-24 LAB — BASIC METABOLIC PANEL
Anion gap: 6 (ref 5–15)
BUN: 20 mg/dL (ref 8–23)
CO2: 27 mmol/L (ref 22–32)
Calcium: 8.9 mg/dL (ref 8.9–10.3)
Chloride: 96 mmol/L — ABNORMAL LOW (ref 98–111)
Creatinine, Ser: 0.86 mg/dL (ref 0.61–1.24)
GFR, Estimated: >60 mL/min (ref >60)
Glucose, Bld: 163 mg/dL — ABNORMAL HIGH (ref 70–99)
Potassium: 4.4 mmol/L (ref 3.5–5.1)
Sodium: 129 mmol/L — ABNORMAL LOW (ref 135–145)

## 2021-05-24 LAB — GLUCOSE, CAPILLARY
Glucose-Capillary: 200 mg/dL — ABNORMAL HIGH (ref 70–99)
Glucose-Capillary: 121 mg/dL — ABNORMAL HIGH (ref 70–99)
Glucose-Capillary: 112 mg/dL — ABNORMAL HIGH (ref 70–99)
Glucose-Capillary: 98 mg/dL (ref 70–99)

## 2021-05-24 LAB — MAGNESIUM: Magnesium: 2.1 mg/dL (ref 1.7–2.4)

## 2021-05-24 NOTE — Discharge Summary (Signed)
? ?Name: Mario [name] ?MRN: [id number] ?DOB: 1951/02/17 71 y.o. ?PCP: [facility] ? ?Date of Admission: 05/21/2021  8:25 PM ?Date of Discharge:  05/24/2021 ?Attending Physician: Dr. Angelia Mould ? ?DISCHARGE DIAGNOSIS:  ?Primary Problem: Acute hypoxemic respiratory failure ([place])  ? ?Hospital Problems: ?Principal Problem: ?  Acute hypoxemic respiratory failure ([place]) ?Active Problems: ?  COPD exacerbation ([place]) ?  CAP (community acquired pneumonia) ?  Non-small cell cancer of left lung ([place]) ?  ? ?DISCHARGE MEDICATIONS:  ? ?Allergies as of 05/24/2021   ? ?   Reactions  ? Demerol [meperidine] Nausea And Vomiting, Other (See Comments)  ? Made the patient "violently sick"  ? Zocor [simvastatin] Nausea And Vomiting, Other (See Comments)  ? Made him very jittery, also  ? ?  ? ?  ?Medication List  ?  ? ?STOP taking these medications   ? ?amoxicillin-clavulanate 875-125 MG tablet ?Commonly known as: AUGMENTIN ?  ?azithromycin 500 MG tablet ?Commonly known as: Zithromax ?  ?predniSONE 20 MG tablet ?Commonly known as: DELTASONE ?  ? ?  ? ?TAKE these medications   ? ?acetaminophen 500 MG tablet ?Commonly known as: TYLENOL ?Take 1,000 mg by mouth every 6 (six) hours as needed for moderate pain. ?  ?albuterol 108 (90 Base) MCG/ACT inhaler ?Commonly known as: VENTOLIN HFA ?Inhale 2 puffs into the lungs every 6 (six) hours as needed for wheezing. ?What changed: Another medication with the same name was changed. Make sure you understand how and when to take each. ?  ?albuterol (2.5 MG/3ML) 0.083% nebulizer solution ?Commonly known as: PROVENTIL ?Inhale 3 mLs (2.5 mg total) by nebulization every 4 (four) hours as needed for wheezing or shortness of breath. ?What changed: when to take this ?  ?apixaban 5 MG Tabs tablet ?Commonly known as: ELIQUIS ?Take 5 mg by mouth 2 (two) times daily. ?  ?aspirin 81 MG EC tablet ?Take 81 mg by mouth daily. ?  ?b complex vitamins capsule ?Take 1 capsule by mouth daily. ?  ?bisacodyl 5 MG  EC tablet ?Commonly known as: DULCOLAX ?Take 1 tablet (5 mg total) by mouth daily at 12 noon. ?What changed: when to take this ?  ?buPROPion 300 MG 24 hr tablet ?Commonly known as: WELLBUTRIN XL ?Take 300 mg by mouth daily. ?  ?busPIRone 15 MG tablet ?Commonly known as: BUSPAR ?Take 15 mg by mouth daily. ?  ?calcium-vitamin D 500-200 MG-UNIT tablet ?Commonly known as: OSCAL WITH D ?Take 1 tablet by mouth 2 (two) times daily with a meal. ?  ?carboxymethylcellulose 0.5 % Soln ?Commonly known as: REFRESH PLUS ?1 drop 4 (four) times daily as needed (dry eyes). ?  ?clobetasol ointment 0.05 % ?Commonly known as: TEMOVATE ?Apply 1 application. topically 2 (two) times daily as needed (itching). ?  ?clotrimazole-betamethasone cream ?Commonly known as: LOTRISONE ?Apply topically 2 (two) times daily. ?  ?cyclobenzaprine 10 MG tablet ?Commonly known as: FLEXERIL ?Take 10-30 mg by mouth at bedtime. 30mg  at bedtime, 10mg  during the day ?  ?cycloSPORINE 0.05 % ophthalmic emulsion ?Commonly known as: RESTASIS ?Place 1 drop into both eyes 2 (two) times daily. ?  ?diltiazem 180 MG 24 hr capsule ?Commonly known as: CARDIZEM CD ?Take 180 mg by mouth daily. ?  ?docusate sodium 100 MG capsule ?Commonly known as: COLACE ?Take 2 capsules (200 mg total) by mouth 2 (two) times daily. ?What changed: when to take this ?  ?DULoxetine 60 MG capsule ?Commonly known as: CYMBALTA ?Take 1 capsule (60 mg total) by mouth  2 (two) times daily. ?  ?feeding supplement Liqd ?Take 237 mLs by mouth 3 (three) times daily between meals. ?What changed: when to take this ?  ?fluticasone 50 MCG/ACT nasal spray ?Commonly known as: FLONASE ?Place 2 sprays into both nostrils daily. ?  ?folic acid 1 MG tablet ?Commonly known as: FOLVITE ?Take 1 mg by mouth daily. ?  ?gabapentin 300 MG capsule ?Commonly known as: NEURONTIN ?Take 900 mg by mouth at bedtime. ?  ?GARLIC OIL PO ?Take 1 capsule by mouth daily. ?  ?guaiFENesin 600 MG 12 hr tablet ?Commonly known as:  Sunol ?Take 1 tablet (600 mg total) by mouth 2 (two) times daily. ?  ?lisinopril 20 MG tablet ?Commonly known as: ZESTRIL ?Take 20 mg by mouth daily. ?  ?lurasidone 40 MG Tabs tablet ?Commonly known as: LATUDA ?Take 40 mg by mouth daily after supper. ?  ?melatonin 3 MG Tabs tablet ?Take 6 mg by mouth at bedtime. ?  ?metFORMIN 500 MG tablet ?Commonly known as: GLUCOPHAGE ?Take 500 mg by mouth 2 (two) times daily with a meal. ?  ?methotrexate 2.5 MG tablet ?Commonly known as: RHEUMATREX ?Take 15 mg by mouth every Saturday. ?  ?Mometasone Furoate 200 MCG/ACT Aero ?Inhale 2 puffs into the lungs daily. ?  ?multivitamin with minerals Tabs tablet ?Take 1 tablet by mouth daily. ?  ?mycophenolate 250 MG capsule ?Commonly known as: CELLCEPT ?Take 1,500 mg by mouth 2 (two) times daily. ?  ?nitroGLYCERIN 0.4 MG SL tablet ?Commonly known as: NITROSTAT ?Place 1 tablet (0.4 mg total) under the tongue every 5 (five) minutes as needed for chest pain. ?  ?OVER THE COUNTER MEDICATION ?Take 2 each by mouth at bedtime as needed (sleep). Pure Zzz ?  ?oxcarbazepine 600 MG tablet ?Commonly known as: TRILEPTAL ?Take 600-900 mg by mouth See admin instructions. Take 600mg  twice daily and 900mg  at bedtime ?  ?OXYGEN ?Inhale 3 L into the lungs at bedtime. ?  ?polyethylene glycol 17 g packet ?Commonly known as: MIRALAX / GLYCOLAX ?Take 17 g by mouth daily. ?  ?prednisoLONE acetate 1 % ophthalmic suspension ?Commonly known as: PRED FORTE ?Place 1 drop into the right eye every hour. ?  ?PRESCRIPTION MEDICATION ?Inhale into the lungs See admin instructions. CPAP- At bedtime and during during any naps ?  ?sodium chloride 0.65 % Soln nasal spray ?Commonly known as: OCEAN ?Place 2 sprays into both nostrils 4 (four) times daily as needed for congestion. ?  ?Striverdi Respimat 2.5 MCG/ACT Aers ?Generic drug: Olodaterol HCl ?Inhale 2 puffs into the lungs daily. ?  ?SYSTANE OP ?Apply 1 drop to eye daily as needed (dry eye). ?  ?tamsulosin 0.4 MG Caps  capsule ?Commonly known as: FLOMAX ?Take 0.4 mg by mouth daily. ?  ?traMADol 50 MG tablet ?Commonly known as: ULTRAM ?Take 100 mg by mouth at bedtime as needed for moderate pain. ?  ?traZODone 50 MG tablet ?Commonly known as: DESYREL ?Take 75 mg by mouth at bedtime. ?  ?VITAMIN A PO ?Take 1 tablet by mouth daily. ?  ?vitamin C 500 MG tablet ?Commonly known as: ASCORBIC ACID ?Take 500 mg by mouth daily. ?  ?Vitamin D-3 25 MCG (1000 UT) Caps ?Take 1,000 Units by mouth daily. ?  ? ?  ? ?  ?  ? ? ?  ?Durable Medical Equipment  ?(From admission, onward)  ?  ? ? ?  ? ?  Start     Ordered  ? 05/23/21 1358  For home use only DME oxygen  Once       ?Question Answer Comment  ?Length of Need Lifetime   ?Mode or (Route) Nasal cannula   ?Liters per Minute 3   ?Frequency Continuous (stationary and portable oxygen unit needed)   ?Oxygen delivery system Gas   ?  ? 05/23/21 1357  ? ?  ?  ? ?  ? ? ?DISPOSITION AND FOLLOW-UP:  ?Mr.RONITH BERTI was discharged from [facility] in Stable condition. At the hospital follow up visit please address: ? ?Acute on Chronic Hypoxic Respiratory Failure ?COPD exacerbation  ?CAP ?Hx of [place] Lung Cancer Stage II s/p radiation ?Patient completed antibiotic and steroid course prior to discharge. Monitor for signs of worsening respiratory status or infection. Recheck CBC for resolution of leukocytosis and blood glucose levels for improvement to normal with completion of steroid course.  Would consider referring the patient to pulmonary rehab and starting a new agent such as Trelegy. ?  ?Hyponatremia ?Recheck BMP to monitor for stable electrolytes. ? ?Follow-up Recommendations: ?Consults: Consider referral to pulmonary rehab. ?Labs: Basic Metabolic Profile and CBC ?Studies: None ?Medications:  ? ?NO medication changes were made at time of discharge. ?Consider starting Trelegy as above. ? ?Follow-up Appointments: ? Follow-up Information   ? ? [facility] Follow up in 1  week(s).   ?Why: Schedule a hospital follow-up visit in the next 1 week. ?Contact information: ?[address] ?[place] [address] ?[phone] ? ? ?  ?  ? ? Sueanne Margarita, MD .   ?Specialt

## 2021-05-24 NOTE — Progress Notes (Signed)
Pt is going home. No cpap needed tonight. ?

## 2021-05-24 NOTE — Progress Notes (Signed)
PT Cancellation Note ? ?Patient Details ?Name: Mario [name] ?MRN: [id number] ?DOB: 11-20-1950 ? ? ?Cancelled Treatment:    Reason Eval/Treat Not Completed: Other (comment).  Pt was concerned about not overexerting today, going to leave later and does not want to be fatigued. Follow up as time and pt allow. ? ? ?[name] ?05/24/2021, 4:11 PM ? ?Mee Hives, PT PhD ?Acute Rehab Dept. Number: [facility] [phone] and [place] [phone] ? ?

## 2021-05-24 NOTE — Hospital Course (Addendum)
Acute on Chronic Hypoxic Respiratory Failure ?COPD exacerbation  ?CAP ?Hx of [place] Lung Cancer Stage II s/p radiation ?Patient presented for exacerbation of shortness of breath after returning home from [facility]. On EMS arrival he was saturating 85% on room air which supplementation improved to 88%. He was hemodynamically stable without fever and did have mild leukocytosis. He was admitted for acute on chronic respiratory failure and continued on azithromycin, Augmentin, and prednisone which he was discharged with. Sputum culture was collected and was negative for staphylococcus or pseudomonas, showed normal respiratory flora only. Ambulatory oxygen saturations were: room air on rest 95%, room air while ambulating 87%, 3L [place] while ambulating 92%. He completed his course of antibiotic and prednisone therapy prior to discharge. ?  ?Sinus tachycardia ?HR max of 122, which was documented on admission. Felt to be in the setting of acute on chronic hypoxic respiratory failure. HR improved to 90s-105 throughout admission. ?  ?Hyponatremia ?Patient was noted to be hyponatremic at 129 at admission which fluctuated from 129-133 throughout admission. This is comparable to previous admisison. ?  ?Ankylosing Spondylitis ?Uveitis ?Chronic conditions. Continued home methotrexate and mycophenolate. ? ?Chronic Back Pain ?Chronic condition. Continued home pain regimen of tramadol, gabapentin, duloxetine, cyclobenzaprine. ?  ?HTN ?Normotensive throughout admission. Continued home lisinopril and diltiazem.  ?  ?OSA ?Chronic condition. Provided CPAP for nightly use. ?  ?Type 2 diabetes mellitus ?Chronic condition, managed outpatient with metformin. HbA1c of 7.6%. Continued home metformin and SSI. ?  ?Depression ?Anxiety ?Insomnia ?Chronic conditions. Continued home trazodone, buspirone, buproprione, lurasidone. ?  ?BPH ?Chronic condition. Continued home Flomax.  ?

## 2021-05-24 NOTE — Progress Notes (Signed)
Mobility Specialist Progress Note: ? ? 05/24/21 1024  ?Mobility  ?Activity Ambulated with assistance to bathroom;Ambulated with assistance in room  ?Level of Assistance Standby assist, set-up cues, supervision of patient - no hands on  ?Assistive Device None  ?Distance Ambulated (ft) 30 ft  ?Activity Response Tolerated well  ?$Mobility charge 1 Mobility  ? ?Pt received in bathroom needing help getting back. Required peri-care. SOB returning to bed (SpO2 85%) educated on bringing oxygen to the bathroom and pursed lip breathing when SOB. Left EOB with call bell in reach and all needs met.  ? ?Mario [name] ?Mobility Specialist ?Primary Phone [phone] ? ?

## 2021-05-24 NOTE — TOC Progression Note (Addendum)
Transition of Care (TOC) - Progression Note  ? ? ?Patient Details  ?Name: Mario [name] ?MRN: [id number] ?Date of Birth: April 12, 1950 ? ?Transition of Care (TOC) CM/SW Contact  ?Marilu Favre, RN ?Phone Number: ?05/24/2021, 10:08 AM ? ?Clinical Narrative:    ? ?[place] no answer no answering services.  ? ?[facility] spoke to [place] . NCM explained to [facility] needs continuous home oxygen and can be discharged as soon as home oxygen arranged and delivered to home. She will message DR Turner's nurse to call NCM back. Await return call.  ? ? ?Patient has home oxygen for CPAP through [facility] DME [phone].   ? ?[facility] will need a contact person name and number for delivery of DME. Patient provided roommate Hattie Perch [phone]   ? ?Marjory Lies with [facility] following  ? ? ?[place] returned call. Katelyn requesting H and P , home oxygen order  and ambulation note be faxed to PCP again Dr Radford Pax fax [phone] and to [facility] [phone] . Both faxes sent . Patient's roommates name and number also given to [facility] and put on both faxes  ? ? ?[place] with [facility] called back and stated they already have orders from Dr Radford Pax for patient to be on 3L [place] continuous oxygen and DME in home. Patient states he does not and NCM yesterday Wendi spoke to [place] at [place] and confirmed. Levada Dy will send [facility] orders to have oxygen delivered today to home today. Levada Dy has roommates number .  ? ?NCM called Hattie Perch [phone] ( from cell and hospital phone )  to let him know, left messages  ? ? ?Dr Marlou Sa spoke to her attending . Patient told them he has a concentrator at home but it does extend to multiple rooms . He also told them that he has multiple 1 hour tanks and 3 hour tank.  Nurse reports patient has been independent of all care . Dr Marlou Sa will discuss discharge with patient for today  . Patient told NCM he could not physically get in a cab.  ? ?NCM can provide cab or [facility] transport home .  ? ?[facility] called patient has a concentrator and 20 tanks at home.  ? ?1440 PTAR called. WIll be here around 5 pm. Secure chatted MD and nurse  ? ?[place] with [facility] aware discharge today. Patient was observation , he does not need orders  ? ?[place] returned call and stated [facility] exchanged empty tanks for full ones today. Wilber Oliphant also confirmed patient has a Conservation officer, nature  ?Expected Discharge Plan: [place] ?  ? ?Expected Discharge Plan and Services ?Expected Discharge Plan: [place] ?  ?Discharge Planning Services: CM Consult ?Post Acute Care Choice: Durable Medical Equipment, Home Health ?Living arrangements for the past 2 months: Apartment ?                ?DME Arranged: Oxygen ?DME Agency: [place] [phone]) ?Date DME Agency Contacted: 05/23/21 ?Time DME Agency Contacted: [phone] ?Representative spoke with at DME Agency: Aaron Edelman ?HH Arranged: PT ?[place] Agency: [place] ?Date HH Agency Contacted: 05/23/21 ?Time [place]: [phone] ?Representative spoke with at [place]: Alwyn Ren ? ? ?Social Determinants of Health (SDOH) Interventions ?  ? ?Readmission Risk Interventions ?   ?  View : No data to display.  ?  ?  ?  ? ? ?

## 2021-05-25 NOTE — Plan of Care (Signed)
?  Problem: Health Behavior/Discharge Planning: ?Goal: Ability to manage health-related needs will improve ?Outcome: Adequate for Discharge ?  ?Problem: Clinical Measurements: ?Goal: Ability to maintain clinical measurements within normal limits will improve ?Outcome: Adequate for Discharge ?Goal: Will remain free from infection ?Outcome: Adequate for Discharge ?Goal: Diagnostic test results will improve ?Outcome: Adequate for Discharge ?Goal: Respiratory complications will improve ?Outcome: Adequate for Discharge ?Goal: Cardiovascular complication will be avoided ?Outcome: Adequate for Discharge ?  ?

## 2021-05-25 NOTE — Plan of Care (Signed)
?  Problem: Health Behavior/Discharge Planning: ?Goal: Ability to manage health-related needs will improve ?05/25/2021 0039 by Sallye Lat, RN ?Outcome: Adequate for Discharge ?05/25/2021 0039 by Sallye Lat, RN ?Outcome: Adequate for Discharge ?  ?Problem: Clinical Measurements: ?Goal: Ability to maintain clinical measurements within normal limits will improve ?05/25/2021 0039 by Sallye Lat, RN ?Outcome: Adequate for Discharge ?05/25/2021 0039 by Sallye Lat, RN ?Outcome: Adequate for Discharge ?Goal: Will remain free from infection ?05/25/2021 0039 by Sallye Lat, RN ?Outcome: Adequate for Discharge ?05/25/2021 0039 by Sallye Lat, RN ?Outcome: Adequate for Discharge ?Goal: Diagnostic test results will improve ?05/25/2021 0039 by Sallye Lat, RN ?Outcome: Adequate for Discharge ?05/25/2021 0039 by Sallye Lat, RN ?Outcome: Adequate for Discharge ?Goal: Respiratory complications will improve ?05/25/2021 0039 by Sallye Lat, RN ?Outcome: Adequate for Discharge ?05/25/2021 0039 by Sallye Lat, RN ?Outcome: Adequate for Discharge ?Goal: Cardiovascular complication will be avoided ?05/25/2021 0039 by Sallye Lat, RN ?Outcome: Adequate for Discharge ?05/25/2021 0039 by Sallye Lat, RN ?Outcome: Adequate for Discharge ?  ?

## 2021-05-25 NOTE — Progress Notes (Signed)
Pt discharged home via PTAR. Vital signs stable, no distress noted. Belongings and discharge paperwork in his possession.  ?

## 2021-06-03 ENCOUNTER — Other Ambulatory Visit: Payer: Self-pay

## 2021-06-03 ENCOUNTER — Emergency Department (HOSPITAL_COMMUNITY): Payer: No Typology Code available for payment source

## 2021-06-03 ENCOUNTER — Encounter (HOSPITAL_COMMUNITY): Payer: Self-pay

## 2021-06-03 ENCOUNTER — Inpatient Hospital Stay (HOSPITAL_COMMUNITY)
Admission: EM | Admit: 2021-06-03 | Discharge: 2021-06-11 | DRG: 871 | Disposition: A | Discharge status: Home / Self Care | Payer: No Typology Code available for payment source | Attending: General Practice | Admitting: General Practice

## 2021-06-03 DIAGNOSIS — M549 Dorsalgia, unspecified: Secondary | ICD-10-CM | POA: Diagnosis present

## 2021-06-03 DIAGNOSIS — I10 Essential (primary) hypertension: Secondary | ICD-10-CM | POA: Diagnosis present

## 2021-06-03 DIAGNOSIS — Z7984 Long term (current) use of oral hypoglycemic drugs: Secondary | ICD-10-CM

## 2021-06-03 DIAGNOSIS — Z86711 Personal history of pulmonary embolism: Secondary | ICD-10-CM | POA: Diagnosis present

## 2021-06-03 DIAGNOSIS — I252 Old myocardial infarction: Secondary | ICD-10-CM

## 2021-06-03 DIAGNOSIS — G8929 Other chronic pain: Secondary | ICD-10-CM | POA: Diagnosis present

## 2021-06-03 DIAGNOSIS — F419 Anxiety disorder, unspecified: Secondary | ICD-10-CM | POA: Diagnosis present

## 2021-06-03 DIAGNOSIS — Z885 Allergy status to narcotic agent status: Secondary | ICD-10-CM

## 2021-06-03 DIAGNOSIS — R6520 Severe sepsis without septic shock: Secondary | ICD-10-CM | POA: Diagnosis present

## 2021-06-03 DIAGNOSIS — Y95 Nosocomial condition: Secondary | ICD-10-CM | POA: Diagnosis present

## 2021-06-03 DIAGNOSIS — N400 Enlarged prostate without lower urinary tract symptoms: Secondary | ICD-10-CM | POA: Diagnosis present

## 2021-06-03 DIAGNOSIS — J90 Pleural effusion, not elsewhere classified: Secondary | ICD-10-CM | POA: Diagnosis present

## 2021-06-03 DIAGNOSIS — K5909 Other constipation: Secondary | ICD-10-CM | POA: Diagnosis present

## 2021-06-03 DIAGNOSIS — Z7901 Long term (current) use of anticoagulants: Secondary | ICD-10-CM

## 2021-06-03 DIAGNOSIS — Z923 Personal history of irradiation: Secondary | ICD-10-CM

## 2021-06-03 DIAGNOSIS — A419 Sepsis, unspecified organism: Secondary | ICD-10-CM | POA: Diagnosis not present

## 2021-06-03 DIAGNOSIS — Z85118 Personal history of other malignant neoplasm of bronchus and lung: Secondary | ICD-10-CM

## 2021-06-03 DIAGNOSIS — E119 Type 2 diabetes mellitus without complications: Secondary | ICD-10-CM

## 2021-06-03 DIAGNOSIS — J189 Pneumonia, unspecified organism: Secondary | ICD-10-CM

## 2021-06-03 DIAGNOSIS — F32A Depression, unspecified: Secondary | ICD-10-CM | POA: Diagnosis present

## 2021-06-03 DIAGNOSIS — Z888 Allergy status to other drugs, medicaments and biological substances status: Secondary | ICD-10-CM

## 2021-06-03 DIAGNOSIS — Z6841 Body Mass Index (BMI) 40.0 and over, adult: Secondary | ICD-10-CM

## 2021-06-03 DIAGNOSIS — J9601 Acute respiratory failure with hypoxia: Secondary | ICD-10-CM | POA: Diagnosis present

## 2021-06-03 DIAGNOSIS — J9621 Acute and chronic respiratory failure with hypoxia: Secondary | ICD-10-CM | POA: Diagnosis present

## 2021-06-03 DIAGNOSIS — Z20822 Contact with and (suspected) exposure to covid-19: Secondary | ICD-10-CM | POA: Diagnosis present

## 2021-06-03 DIAGNOSIS — Z79899 Other long term (current) drug therapy: Secondary | ICD-10-CM

## 2021-06-03 DIAGNOSIS — J309 Allergic rhinitis, unspecified: Secondary | ICD-10-CM | POA: Diagnosis present

## 2021-06-03 DIAGNOSIS — R0602 Shortness of breath: Secondary | ICD-10-CM | POA: Diagnosis not present

## 2021-06-03 DIAGNOSIS — J441 Chronic obstructive pulmonary disease with (acute) exacerbation: Secondary | ICD-10-CM | POA: Diagnosis present

## 2021-06-03 DIAGNOSIS — Z87891 Personal history of nicotine dependence: Secondary | ICD-10-CM

## 2021-06-03 DIAGNOSIS — J439 Emphysema, unspecified: Secondary | ICD-10-CM | POA: Diagnosis present

## 2021-06-03 DIAGNOSIS — Z7982 Long term (current) use of aspirin: Secondary | ICD-10-CM

## 2021-06-03 DIAGNOSIS — G4733 Obstructive sleep apnea (adult) (pediatric): Secondary | ICD-10-CM | POA: Diagnosis present

## 2021-06-03 DIAGNOSIS — Z79631 Long term (current) use of antimetabolite agent: Secondary | ICD-10-CM

## 2021-06-03 LAB — RESP PANEL BY RT-PCR (FLU A&B, COVID) ARPGX2
Influenza A by PCR: NEGATIVE
Influenza B by PCR: NEGATIVE
SARS Coronavirus 2 by RT PCR: NEGATIVE

## 2021-06-03 LAB — CBC
HCT: 43.4 % (ref 39.0–52.0)
Hemoglobin: 14.5 g/dL (ref 13.0–17.0)
MCH: 32.1 pg (ref 26.0–34.0)
MCHC: 33.4 g/dL (ref 30.0–36.0)
MCV: 96 fL (ref 80.0–100.0)
Platelets: 359 10*3/uL (ref 150–400)
RBC: 4.52 MIL/uL (ref 4.22–5.81)
RDW: 13.9 % (ref 11.5–15.5)
WBC: 13.7 10*3/uL — ABNORMAL HIGH (ref 4.0–10.5)
nRBC: 0 % (ref 0.0–0.2)

## 2021-06-03 MED ORDER — ALBUTEROL SULFATE (2.5 MG/3ML) 0.083% IN NEBU
5.0000 mg | INHALATION_SOLUTION | Freq: Once | RESPIRATORY_TRACT | Status: AC
  Administered 2021-06-03: 5 mg via RESPIRATORY_TRACT
  Filled 2021-06-03: qty 6

## 2021-06-03 MED ORDER — ALBUTEROL SULFATE HFA 108 (90 BASE) MCG/ACT IN AERS: 2.0000 | INHALATION_SPRAY | RESPIRATORY_TRACT | Status: DC | PRN

## 2021-06-03 NOTE — ED Triage Notes (Signed)
Patient BIB [facility] from home. Talked to [place] they said he was having COPD exacerbation. On room air 90%. No wheezing noted. Prescribed 3L [place] from the [place] but waiting for a concentrator.  ?

## 2021-06-04 ENCOUNTER — Inpatient Hospital Stay (HOSPITAL_COMMUNITY): Payer: No Typology Code available for payment source

## 2021-06-04 ENCOUNTER — Encounter (HOSPITAL_COMMUNITY): Payer: Self-pay | Admitting: Emergency Medicine

## 2021-06-04 DIAGNOSIS — J441 Chronic obstructive pulmonary disease with (acute) exacerbation: Secondary | ICD-10-CM | POA: Diagnosis not present

## 2021-06-04 DIAGNOSIS — K5909 Other constipation: Secondary | ICD-10-CM | POA: Diagnosis present

## 2021-06-04 DIAGNOSIS — J9601 Acute respiratory failure with hypoxia: Secondary | ICD-10-CM | POA: Diagnosis not present

## 2021-06-04 DIAGNOSIS — J309 Allergic rhinitis, unspecified: Secondary | ICD-10-CM | POA: Diagnosis present

## 2021-06-04 DIAGNOSIS — G8929 Other chronic pain: Secondary | ICD-10-CM | POA: Diagnosis present

## 2021-06-04 DIAGNOSIS — R6520 Severe sepsis without septic shock: Secondary | ICD-10-CM | POA: Diagnosis present

## 2021-06-04 DIAGNOSIS — I10 Essential (primary) hypertension: Secondary | ICD-10-CM | POA: Diagnosis present

## 2021-06-04 DIAGNOSIS — R0602 Shortness of breath: Secondary | ICD-10-CM | POA: Diagnosis present

## 2021-06-04 DIAGNOSIS — Z86711 Personal history of pulmonary embolism: Secondary | ICD-10-CM | POA: Diagnosis not present

## 2021-06-04 DIAGNOSIS — J9621 Acute and chronic respiratory failure with hypoxia: Secondary | ICD-10-CM | POA: Diagnosis present

## 2021-06-04 DIAGNOSIS — E1165 Type 2 diabetes mellitus with hyperglycemia: Secondary | ICD-10-CM

## 2021-06-04 DIAGNOSIS — I252 Old myocardial infarction: Secondary | ICD-10-CM | POA: Diagnosis not present

## 2021-06-04 DIAGNOSIS — J439 Emphysema, unspecified: Secondary | ICD-10-CM | POA: Diagnosis present

## 2021-06-04 DIAGNOSIS — Z6841 Body Mass Index (BMI) 40.0 and over, adult: Secondary | ICD-10-CM | POA: Diagnosis not present

## 2021-06-04 DIAGNOSIS — J301 Allergic rhinitis due to pollen: Secondary | ICD-10-CM

## 2021-06-04 DIAGNOSIS — J189 Pneumonia, unspecified organism: Secondary | ICD-10-CM | POA: Diagnosis present

## 2021-06-04 DIAGNOSIS — A419 Sepsis, unspecified organism: Secondary | ICD-10-CM | POA: Diagnosis present

## 2021-06-04 DIAGNOSIS — F32A Depression, unspecified: Secondary | ICD-10-CM | POA: Diagnosis present

## 2021-06-04 DIAGNOSIS — Z85118 Personal history of other malignant neoplasm of bronchus and lung: Secondary | ICD-10-CM | POA: Diagnosis not present

## 2021-06-04 DIAGNOSIS — M549 Dorsalgia, unspecified: Secondary | ICD-10-CM | POA: Diagnosis present

## 2021-06-04 DIAGNOSIS — Y95 Nosocomial condition: Secondary | ICD-10-CM | POA: Diagnosis present

## 2021-06-04 DIAGNOSIS — Z87891 Personal history of nicotine dependence: Secondary | ICD-10-CM | POA: Diagnosis not present

## 2021-06-04 DIAGNOSIS — J90 Pleural effusion, not elsewhere classified: Secondary | ICD-10-CM | POA: Diagnosis present

## 2021-06-04 DIAGNOSIS — Z923 Personal history of irradiation: Secondary | ICD-10-CM | POA: Diagnosis not present

## 2021-06-04 DIAGNOSIS — E119 Type 2 diabetes mellitus without complications: Secondary | ICD-10-CM | POA: Diagnosis present

## 2021-06-04 DIAGNOSIS — N400 Enlarged prostate without lower urinary tract symptoms: Secondary | ICD-10-CM | POA: Diagnosis present

## 2021-06-04 DIAGNOSIS — Z20822 Contact with and (suspected) exposure to covid-19: Secondary | ICD-10-CM | POA: Diagnosis present

## 2021-06-04 DIAGNOSIS — F419 Anxiety disorder, unspecified: Secondary | ICD-10-CM | POA: Diagnosis present

## 2021-06-04 DIAGNOSIS — G4733 Obstructive sleep apnea (adult) (pediatric): Secondary | ICD-10-CM | POA: Diagnosis present

## 2021-06-04 LAB — BASIC METABOLIC PANEL
Anion gap: 11 (ref 5–15)
BUN: 17 mg/dL (ref 8–23)
CO2: 23 mmol/L (ref 22–32)
Calcium: 9.5 mg/dL (ref 8.9–10.3)
Chloride: 99 mmol/L (ref 98–111)
Creatinine, Ser: 0.99 mg/dL (ref 0.61–1.24)
GFR, Estimated: >60 mL/min (ref >60)
Glucose, Bld: 188 mg/dL — ABNORMAL HIGH (ref 70–99)
Potassium: 4.1 mmol/L (ref 3.5–5.1)
Sodium: 133 mmol/L — ABNORMAL LOW (ref 135–145)

## 2021-06-04 LAB — COMPREHENSIVE METABOLIC PANEL
ALT: 27 U/L (ref 0–44)
AST: 25 U/L (ref 15–41)
Albumin: 4.2 g/dL (ref 3.5–5.0)
Alkaline Phosphatase: 84 U/L (ref 38–126)
Anion gap: 13 (ref 5–15)
BUN: 18 mg/dL (ref 8–23)
CO2: 20 mmol/L — ABNORMAL LOW (ref 22–32)
Calcium: 9.4 mg/dL (ref 8.9–10.3)
Chloride: 96 mmol/L — ABNORMAL LOW (ref 98–111)
Creatinine, Ser: 1.24 mg/dL (ref 0.61–1.24)
GFR, Estimated: >60 mL/min (ref >60)
Glucose, Bld: 295 mg/dL — ABNORMAL HIGH (ref 70–99)
Potassium: 5.1 mmol/L (ref 3.5–5.1)
Sodium: 129 mmol/L — ABNORMAL LOW (ref 135–145)
Total Bilirubin: 0.4 mg/dL (ref 0.3–1.2)
Total Protein: 7.7 g/dL (ref 6.5–8.1)

## 2021-06-04 LAB — CBC WITH DIFFERENTIAL/PLATELET
Abs Immature Granulocytes: 0.06 10*3/uL (ref 0.00–0.07)
Basophils Absolute: 0 10*3/uL (ref 0.0–0.1)
Basophils Relative: 0 %
Eosinophils Absolute: 0 10*3/uL (ref 0.0–0.5)
Eosinophils Relative: 0 %
HCT: 43 % (ref 39.0–52.0)
Hemoglobin: 14.5 g/dL (ref 13.0–17.0)
Immature Granulocytes: 1 %
Lymphocytes Relative: 4 %
Lymphs Abs: 0.5 10*3/uL — ABNORMAL LOW (ref 0.7–4.0)
MCH: 32.9 pg (ref 26.0–34.0)
MCHC: 33.7 g/dL (ref 30.0–36.0)
MCV: 97.5 fL (ref 80.0–100.0)
Monocytes Absolute: 0.1 10*3/uL (ref 0.1–1.0)
Monocytes Relative: 1 %
Neutro Abs: 11.5 10*3/uL — ABNORMAL HIGH (ref 1.7–7.7)
Neutrophils Relative %: 94 %
Platelets: 371 10*3/uL (ref 150–400)
RBC: 4.41 MIL/uL (ref 4.22–5.81)
RDW: 14.2 % (ref 11.5–15.5)
WBC: 12.1 10*3/uL — ABNORMAL HIGH (ref 4.0–10.5)
nRBC: 0 % (ref 0.0–0.2)

## 2021-06-04 LAB — BRAIN NATRIURETIC PEPTIDE: B Natriuretic Peptide: 29.6 pg/mL (ref 0.0–100.0)

## 2021-06-04 LAB — BLOOD GAS, ARTERIAL
Acid-Base Excess: 3.2 mmol/L — ABNORMAL HIGH (ref 0.0–2.0)
Bicarbonate: 26.9 mmol/L (ref 20.0–28.0)
Drawn by: 23532
O2 Content: 4 L/min
O2 Saturation: 96.9 %
Patient temperature: 37
pCO2 arterial: 37 mmHg (ref 32–48)
pH, Arterial: 7.47 — ABNORMAL HIGH (ref 7.35–7.45)
pO2, Arterial: 70 mmHg — ABNORMAL LOW (ref 83–108)

## 2021-06-04 LAB — BLOOD GAS, VENOUS
Acid-base deficit: 3.3 mmol/L — ABNORMAL HIGH (ref 0.0–2.0)
Bicarbonate: 21.3 mmol/L (ref 20.0–28.0)
O2 Saturation: 83.8 %
Patient temperature: 37
pCO2, Ven: 36 mmHg — ABNORMAL LOW (ref 44–60)
pH, Ven: 7.38 (ref 7.25–7.43)
pO2, Ven: 50 mmHg — ABNORMAL HIGH (ref 32–45)

## 2021-06-04 LAB — MAGNESIUM
Magnesium: 1.9 mg/dL (ref 1.7–2.4)
Magnesium: 2 mg/dL (ref 1.7–2.4)

## 2021-06-04 LAB — TROPONIN I (HIGH SENSITIVITY)
Troponin I (High Sensitivity): 5 ng/L (ref <18)
Troponin I (High Sensitivity): 5 ng/L (ref <18)

## 2021-06-04 LAB — LACTIC ACID, PLASMA
Lactic Acid, Venous: 4.4 mmol/L (ref 0.5–1.9)
Lactic Acid, Venous: 4 mmol/L (ref 0.5–1.9)
Lactic Acid, Venous: 3.4 mmol/L (ref 0.5–1.9)

## 2021-06-04 LAB — MRSA NEXT GEN BY PCR, NASAL: MRSA by PCR Next Gen: NOT DETECTED

## 2021-06-04 LAB — PROCALCITONIN: Procalcitonin: <0.1 ng/mL

## 2021-06-04 LAB — PHOSPHORUS: Phosphorus: 3.1 mg/dL (ref 2.5–4.6)

## 2021-06-04 LAB — GLUCOSE, CAPILLARY: Glucose-Capillary: 223 mg/dL — ABNORMAL HIGH (ref 70–99)

## 2021-06-04 MED ORDER — SODIUM CHLORIDE 0.9 % IV SOLN
2.0000 g | Freq: Three times a day (TID) | INTRAVENOUS | Status: DC
  Administered 2021-06-04 – 2021-06-10 (×19): 2 g via INTRAVENOUS
  Filled 2021-06-04 (×21): qty 12.5

## 2021-06-04 MED ORDER — TAMSULOSIN HCL 0.4 MG PO CAPS
0.4000 mg | ORAL_CAPSULE | Freq: Every day | ORAL | Status: DC
  Administered 2021-06-04 – 2021-06-11 (×8): 0.4 mg via ORAL
  Filled 2021-06-04 (×8): qty 1

## 2021-06-04 MED ORDER — APIXABAN 5 MG PO TABS
5.0000 mg | ORAL_TABLET | Freq: Two times a day (BID) | ORAL | Status: DC
  Administered 2021-06-04 – 2021-06-11 (×15): 5 mg via ORAL
  Filled 2021-06-04 (×15): qty 1

## 2021-06-04 MED ORDER — BUSPIRONE HCL 10 MG PO TABS
15.0000 mg | ORAL_TABLET | Freq: Every day | ORAL | Status: DC
  Administered 2021-06-04 – 2021-06-11 (×8): 15 mg via ORAL
  Filled 2021-06-04 (×8): qty 2

## 2021-06-04 MED ORDER — LISINOPRIL 10 MG PO TABS
20.0000 mg | ORAL_TABLET | Freq: Every day | ORAL | Status: DC
  Administered 2021-06-04 – 2021-06-11 (×8): 20 mg via ORAL
  Filled 2021-06-04 (×8): qty 2

## 2021-06-04 MED ORDER — DILTIAZEM HCL ER COATED BEADS 180 MG PO CP24
180.0000 mg | ORAL_CAPSULE | Freq: Every day | ORAL | Status: DC
  Administered 2021-06-04 – 2021-06-11 (×8): 180 mg via ORAL
  Filled 2021-06-04 (×8): qty 1

## 2021-06-04 MED ORDER — ASPIRIN EC 81 MG PO TBEC
81.0000 mg | DELAYED_RELEASE_TABLET | Freq: Every day | ORAL | Status: DC
  Administered 2021-06-04 – 2021-06-11 (×8): 81 mg via ORAL
  Filled 2021-06-04 (×8): qty 1

## 2021-06-04 MED ORDER — LURASIDONE HCL 40 MG PO TABS
40.0000 mg | ORAL_TABLET | Freq: Every day | ORAL | Status: DC
  Administered 2021-06-04 – 2021-06-10 (×7): 40 mg via ORAL
  Filled 2021-06-04 (×7): qty 1

## 2021-06-04 MED ORDER — FLUTICASONE PROPIONATE 50 MCG/ACT NA SUSP
2.0000 | Freq: Every day | NASAL | Status: DC
  Administered 2021-06-04 – 2021-06-11 (×8): 2 via NASAL
  Filled 2021-06-04: qty 16

## 2021-06-04 MED ORDER — IOHEXOL 350 MG/ML SOLN
100.0000 mL | Freq: Once | INTRAVENOUS | Status: AC | PRN
  Administered 2021-06-04: 100 mL via INTRAVENOUS

## 2021-06-04 MED ORDER — VANCOMYCIN HCL 2000 MG/400ML IV SOLN
2000.0000 mg | Freq: Once | INTRAVENOUS | Status: AC
  Administered 2021-06-04: 2000 mg via INTRAVENOUS
  Filled 2021-06-04: qty 400

## 2021-06-04 MED ORDER — BUPROPION HCL ER (XL) 150 MG PO TB24
300.0000 mg | ORAL_TABLET | Freq: Every day | ORAL | Status: DC
  Administered 2021-06-04 – 2021-06-11 (×8): 300 mg via ORAL
  Filled 2021-06-04 (×8): qty 2

## 2021-06-04 MED ORDER — OXCARBAZEPINE 300 MG PO TABS: 600.0000 mg | ORAL_TABLET | ORAL | Status: DC

## 2021-06-04 MED ORDER — ARFORMOTEROL TARTRATE 15 MCG/2ML IN NEBU
15.0000 ug | INHALATION_SOLUTION | Freq: Two times a day (BID) | RESPIRATORY_TRACT | Status: DC
  Administered 2021-06-04 – 2021-06-11 (×14): 15 ug via RESPIRATORY_TRACT
  Filled 2021-06-04 (×16): qty 2

## 2021-06-04 MED ORDER — SODIUM CHLORIDE (PF) 0.9 % IJ SOLN
INTRAMUSCULAR | Status: AC
  Filled 2021-06-04: qty 50

## 2021-06-04 MED ORDER — IPRATROPIUM-ALBUTEROL 0.5-2.5 (3) MG/3ML IN SOLN
3.0000 mL | Freq: Four times a day (QID) | RESPIRATORY_TRACT | Status: DC
  Administered 2021-06-04 – 2021-06-05 (×6): 3 mL via RESPIRATORY_TRACT
  Filled 2021-06-04 (×6): qty 3

## 2021-06-04 MED ORDER — CHLORHEXIDINE GLUCONATE 0.12 % MT SOLN
15.0000 mL | Freq: Two times a day (BID) | OROMUCOSAL | Status: DC
  Administered 2021-06-04 – 2021-06-11 (×14): 15 mL via OROMUCOSAL
  Filled 2021-06-04 (×14): qty 15

## 2021-06-04 MED ORDER — GABAPENTIN 300 MG PO CAPS
900.0000 mg | ORAL_CAPSULE | Freq: Every day | ORAL | Status: DC
  Administered 2021-06-04 – 2021-06-10 (×7): 900 mg via ORAL
  Filled 2021-06-04 (×7): qty 3

## 2021-06-04 MED ORDER — SODIUM CHLORIDE 0.9 % IV SOLN
2.0000 g | Freq: Once | INTRAVENOUS | Status: AC
  Administered 2021-06-04: 2 g via INTRAVENOUS
  Filled 2021-06-04: qty 12.5

## 2021-06-04 MED ORDER — GUAIFENESIN ER 600 MG PO TB12
600.0000 mg | ORAL_TABLET | Freq: Two times a day (BID) | ORAL | Status: DC
Start: 2021-06-04 — End: 2021-06-11
  Administered 2021-06-04 – 2021-06-11 (×15): 600 mg via ORAL
  Filled 2021-06-04 (×15): qty 1

## 2021-06-04 MED ORDER — INSULIN ASPART 100 UNIT/ML IJ SOLN
0.0000 [IU] | Freq: Three times a day (TID) | INTRAMUSCULAR | Status: DC
  Administered 2021-06-04: 5 [IU] via SUBCUTANEOUS
  Administered 2021-06-04: 3 [IU] via SUBCUTANEOUS
  Administered 2021-06-05: 7 [IU] via SUBCUTANEOUS
  Filled 2021-06-04: qty 0.09

## 2021-06-04 MED ORDER — MYCOPHENOLATE MOFETIL 250 MG PO CAPS
1500.0000 mg | ORAL_CAPSULE | Freq: Two times a day (BID) | ORAL | Status: DC
Start: 2021-06-04 — End: 2021-06-11
  Administered 2021-06-04 – 2021-06-11 (×15): 1500 mg via ORAL
  Filled 2021-06-04 (×15): qty 6

## 2021-06-04 MED ORDER — ACETAMINOPHEN 650 MG RE SUPP: 650.0000 mg | Freq: Four times a day (QID) | RECTAL | Status: DC | PRN

## 2021-06-04 MED ORDER — ALBUTEROL SULFATE (2.5 MG/3ML) 0.083% IN NEBU
5.0000 mg | INHALATION_SOLUTION | Freq: Once | RESPIRATORY_TRACT | Status: AC
  Administered 2021-06-04: 5 mg via RESPIRATORY_TRACT
  Filled 2021-06-04: qty 6

## 2021-06-04 MED ORDER — METHYLPREDNISOLONE SODIUM SUCC 125 MG IJ SOLR
80.0000 mg | Freq: Two times a day (BID) | INTRAMUSCULAR | Status: DC
  Administered 2021-06-04 – 2021-06-09 (×11): 80 mg via INTRAVENOUS
  Filled 2021-06-04 (×11): qty 2

## 2021-06-04 MED ORDER — ORAL CARE MOUTH RINSE
15.0000 mL | Freq: Two times a day (BID) | OROMUCOSAL | Status: DC
  Administered 2021-06-05 – 2021-06-10 (×9): 15 mL via OROMUCOSAL

## 2021-06-04 MED ORDER — BUDESONIDE 0.5 MG/2ML IN SUSP
0.5000 mg | Freq: Two times a day (BID) | RESPIRATORY_TRACT | Status: DC
  Administered 2021-06-04 – 2021-06-10 (×12): 0.5 mg via RESPIRATORY_TRACT
  Filled 2021-06-04 (×12): qty 2

## 2021-06-04 MED ORDER — OXCARBAZEPINE 300 MG PO TABS
600.0000 mg | ORAL_TABLET | Freq: Two times a day (BID) | ORAL | Status: DC
  Administered 2021-06-04 – 2021-06-11 (×13): 600 mg via ORAL
  Filled 2021-06-04 (×14): qty 2
  Filled 2021-06-04: qty 1
  Filled 2021-06-04 (×2): qty 2

## 2021-06-04 MED ORDER — OXCARBAZEPINE 300 MG PO TABS
900.0000 mg | ORAL_TABLET | Freq: Every day | ORAL | Status: DC
  Administered 2021-06-04 – 2021-06-10 (×7): 900 mg via ORAL
  Filled 2021-06-04 (×7): qty 3

## 2021-06-04 MED ORDER — ACETAMINOPHEN 325 MG PO TABS
650.0000 mg | ORAL_TABLET | Freq: Four times a day (QID) | ORAL | Status: DC | PRN
  Administered 2021-06-04 – 2021-06-11 (×13): 650 mg via ORAL
  Filled 2021-06-04 (×13): qty 2

## 2021-06-04 MED ORDER — IPRATROPIUM-ALBUTEROL 0.5-2.5 (3) MG/3ML IN SOLN: 3.0000 mL | Freq: Four times a day (QID) | RESPIRATORY_TRACT | Status: DC

## 2021-06-04 MED ORDER — VANCOMYCIN HCL 1500 MG/300ML IV SOLN
1500.0000 mg | Freq: Two times a day (BID) | INTRAVENOUS | Status: DC
  Filled 2021-06-04: qty 300

## 2021-06-04 MED ORDER — ALBUTEROL SULFATE (2.5 MG/3ML) 0.083% IN NEBU
2.5000 mg | INHALATION_SOLUTION | RESPIRATORY_TRACT | Status: DC | PRN
Start: 2021-06-04 — End: 2021-06-11
  Administered 2021-06-04 – 2021-06-06 (×2): 2.5 mg via RESPIRATORY_TRACT
  Filled 2021-06-04 (×2): qty 3

## 2021-06-04 MED ORDER — CLOBETASOL PROPIONATE 0.05 % EX OINT
1.0000 "application " | TOPICAL_OINTMENT | Freq: Two times a day (BID) | CUTANEOUS | Status: DC | PRN
  Administered 2021-06-04: 1 via TOPICAL
  Filled 2021-06-04: qty 15

## 2021-06-04 MED ORDER — SODIUM CHLORIDE 0.9 % IV BOLUS
1000.0000 mL | Freq: Once | INTRAVENOUS | Status: AC
  Administered 2021-06-04: 1000 mL via INTRAVENOUS

## 2021-06-04 NOTE — Assessment & Plan Note (Signed)
? ?#)   Acute hypoxic respiratory failure: in the context of acute respiratory symptoms and patient's report of no known baseline supplemental O2 requirements during the day, presenting O2 sat noted to be in the mid 80s on room air, subsequent improving into the mid 90s on 3 to 4 L nasal cannula. Appears to be on basis of right lower lobe pneumonia, as above, resulting in acute COPD exacerbation as further outlined above.  Additionally, CTA chest ordered by EDP, with result currently pending. ? ? ?Plan: further evaluation/management of presenting right lower lobe pneumonia and resultant acute COPD exacerbation, as above. Monitor continuous pulse ox with prn supplemental O2 to maintain O2 sats greater than or equal to 92%. monitor on telemetry. CMP/CBC in the AM. Check serum Mg and Phos levels. Check blood gas. Flutter valve, incentive spirometry.  ? ? ? ?

## 2021-06-04 NOTE — Assessment & Plan Note (Signed)
? ? ?#)   Essential Hypertension: documented h/o such, with outpatient antihypertensive regimen including lisinopril, diltiazem.  SBP's in the ED today: In the 130s.  ? ?Plan: Close monitoring of subsequent BP via routine VS. continue home antihypertensive medications. ? ?

## 2021-06-04 NOTE — H&P (Addendum)
?History and Physical  ? ? ?PLEASE NOTE THAT DRAGON DICTATION SOFTWARE WAS USED IN THE CONSTRUCTION OF THIS NOTE. ? ? ?Mario [name] HUD:[id number] DOB: 01/30/1951 DOA: 06/03/2021 ? ?PCP: [facility]  ?Patient coming from: home  ? ?I have personally briefly reviewed patient's old medical records in [place] ? ?Chief Complaint: Shortness of breath ? ?HPI: Mario [name] is a 71 y.o. 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  ? ?The patient was recently hospitalized for acute COPD exacerbation in the setting of suspected acquired pneumonia for which he received IV antibiotics.  Over the course of that hospitalization, he reports that his breathing improved with breathing treatments, steroids, and attics.  Only discharged home approximately 1 week ago, reports ensuing development of progressive shortness of breath over the last 3 to 4 days associated new onset productive cough and subjective fever.  Is any associated orthopnea, PND, or new onset peripheral edema.  Not associate with any chest pain, diaphoresis, potation's, nausea, vomiting, presyncope, or syncope.  No hemoptysis.  He does note some intermittent wheezing over the last few days.  No new lower extremity erythema or calf tenderness.  No recent trauma or travel.  Denies any headache or neck stiffness, abdominal pain, nausea, vomiting, diarrhea, or rash. ? ?He acknowledges a history of COPD, reports that he is on 3 L nasal cannula during nocturnal timeframe alone.  Denies any known history of diurnal supplemental oxygen requirements. ? ? ? ?ED Course:  ?Vital signs in the ED were notable for the following: Afebrile; heart rate  83-90; blood pressure 132/74; respiratory rate 16-24, oxygen saturation initially noted to be 86% on room air, subsequent proving to 95 to 96% on 3 to 4 L nasal cannula while awake. ? ?Labs were notable for the following: BMP notable for creatinine 0.99.  CBC notable for white blood cell count 13,700.  BNP 29, high-sensitivity troponin I x2 values were both found to be 5.  COVID-19/influenza PCR negative. ? ?Imaging and additional notable ED work-up: 2 view chest x-ray in comparison to most recent prior chest x-ray from 05/21/2021 showed interval development of infiltrate at the right lung base concerning for pneumonia, in the absence of any evidence of edema, effusion, or pneumothorax.  CTA chest was ordered by EDP, with result currently pending. ? ?While in the ED, the following were administered: Albuterol nebulizer, cefepime, IV vancomycin. ? ?Subsequently, the patient was admitted for further evaluation management of severe sepsis due to suspected right lower lobe pneumonia, complicated by acute COPD exacerbation resulting in acute hypoxic respiratory failure.  ? ? ? ?Review of Systems: As per HPI otherwise 10 point review of systems negative.  ? ?Past Medical History:  ?Diagnosis Date  ? Anxiety   ? Bronchitis   ? COPD (chronic obstructive pulmonary disease) ([place])   ? Depression   ? History of radiation therapy   ? Left lung- 10/05/20-10/15/20- Dr. Gery Pray  ? Hypertension   ? lung ca 09/2020  ? Mental disorder   ? MI (myocardial infarction) ([place])   ? ????  ? OSA (obstructive sleep apnea)   ? Suicide attempt [facility])   ? Tension pneumothorax 06/27/2016  ? Uveitis   ? ? ?Past  Surgical History:  ?Procedure Laterality Date  ? BRONCHIAL BIOPSY  07/30/2020  ? Procedure: BRONCHIAL BIOPSIES;  Surgeon: Garner Nash, DO;  Location: [facility];  Service: Pulmonary;;  ? BRONCHIAL BRUSHINGS  07/30/2020  ? Procedure: BRONCHIAL BRUSHINGS;  Surgeon: Garner Nash, DO;  Location: [facility];  Service: Pulmonary;;  ?  BRONCHIAL NEEDLE ASPIRATION BIOPSY  07/30/2020  ? Procedure: BRONCHIAL NEEDLE ASPIRATION BIOPSIES;  Surgeon: Garner Nash, DO;  Location: [facility];  Service: Pulmonary;;  ? BRONCHIAL WASHINGS  07/30/2020  ? Procedure: BRONCHIAL WASHINGS;  Surgeon: Garner Nash, DO;  Location: [place];  Service: Pulmonary;;  ? CHEST TUBE INSERTION Left 06/27/2016  ? cryptorchidism    ? SKIN CANCER EXCISION    ? VIDEO BRONCHOSCOPY WITH ENDOBRONCHIAL NAVIGATION Left 07/30/2020  ? Procedure: VIDEO BRONCHOSCOPY WITH ENDOBRONCHIAL NAVIGATION;  Surgeon: Garner Nash, DO;  Location: [facility];  Service: Pulmonary;  Laterality: Left;  ? ? ?Social History: ? reports that he quit smoking about 5 years ago. His smoking use included cigarettes. He has a 35.00 pack-year smoking history. He has never used smokeless tobacco. He reports that he does not drink alcohol and does not use drugs. ? ? ?Allergies  ?Allergen Reactions  ? Demerol [Meperidine] Nausea And Vomiting and Other (See Comments)  ?  Made the patient "violently sick"  ? Zocor [Simvastatin] Nausea And Vomiting and Other (See Comments)  ?  Made him very jittery, also  ? ? ?Family History  ?Problem Relation Age of Onset  ? Dementia Father   ? ? ?Family history reviewed and not pertinent  ? ? ?Prior to Admission medications   ?Medication Sig Start Date End Date Taking? Authorizing Provider  ?acetaminophen (TYLENOL) 500 MG tablet Take 1,000 mg by mouth every 6 (six) hours as needed for moderate pain.    [provider]  ?albuterol (PROVENTIL) (2.5 MG/3ML) 0.083% nebulizer solution Inhale 3 mLs (2.5 mg total) by nebulization every 4 (four) hours as needed for wheezing or shortness of breath. ?Patient taking differently: Take 2.5 mg by nebulization every 6 (six) hours as needed for wheezing or shortness of breath. 08/04/20 08/04/21  Lavina Hamman, MD  ?albuterol (VENTOLIN HFA) 108 (90 Base) MCG/ACT inhaler Inhale 2 puffs into the lungs every 6 (six) hours as needed  for wheezing. 06/02/20   [provider]  ?apixaban (ELIQUIS) 5 MG TABS tablet Take 5 mg by mouth 2 (two) times daily.    [provider]  ?aspirin 81 MG EC tablet Take 81 mg by mouth daily.    [provider]  ?b complex vitamins capsule Take 1 capsule by mouth daily.    [provider]  ?bisacodyl (DULCOLAX) 5 MG EC tablet Take 1 tablet (5 mg total) by mouth daily at 12 noon. ?Patient taking differently: Take 5 mg by mouth daily. 08/04/20   Lavina Hamman, MD  ?buPROPion (WELLBUTRIN XL) 300 MG 24 hr tablet Take 300 mg by mouth daily. 02/05/20   [provider]  ?busPIRone (BUSPAR) 15 MG tablet Take 15 mg by mouth daily.    [provider]  ?calcium-vitamin D (OSCAL WITH D) 500-200 MG-UNIT tablet Take 1 tablet by mouth 2 (two) times daily with a meal.    [provider]  ?carboxymethylcellulose (REFRESH PLUS) 0.5 % SOLN 1 drop 4 (four) times daily as needed (dry eyes).    [provider]  ?Cholecalciferol (VITAMIN D-3) 25 MCG (1000 UT) CAPS Take 1,000 Units by mouth daily.  [provider]  ?clobetasol ointment (TEMOVATE) 4.43 % Apply 1 application. topically 2 (two) times daily as needed (itching).    [provider]  ?clotrimazole-betamethasone (LOTRISONE) cream Apply topically 2 (two) times daily. 05/21/21   Farrel Gordon, DO  ?cyclobenzaprine (FLEXERIL) 10 MG tablet Take 10-30 mg by mouth at bedtime. 30mg  at bedtime, 10mg  during the day    [provider]  ?cycloSPORINE (RESTASIS) 0.05 % ophthalmic emulsion Place 1 drop into both eyes 2 (two) times daily.    [provider]  ?diltiazem (CARDIZEM CD) 180 MG 24 hr capsule Take 180 mg by mouth daily.    [provider]  ?docusate sodium (COLACE) 100 MG capsule Take 2 capsules (200 mg total) by mouth 2 (two) times daily. ?Patient taking differently: Take 200 mg by mouth daily. 08/04/20   Lavina Hamman, MD  ?DULoxetine (CYMBALTA) 60 MG capsule Take 1  capsule (60 mg total) by mouth 2 (two) times daily. 07/21/17   Lavina Hamman, MD  ?feeding supplement (ENSURE ENLIVE / ENSURE PLUS) LIQD Take 237 mLs by mouth 3 (three) times daily between meals. ?Patient taking

## 2021-06-04 NOTE — Assessment & Plan Note (Signed)
? ?#)   History of acute pulmonary embolism: Documentation of diagnosis of acute pulmonary embolism December 2022, with patient reporting good compliance with ongoing Eliquis therapy. ? ?Plan: Continue home Eliquis. ? ? ?

## 2021-06-04 NOTE — Assessment & Plan Note (Signed)
? ?#)  Severe sepsis due to right lower lobe pneumonia: Diagnosis on the basis of 3 to 4 days of shortness of breath associate with new onset productive cough, subjective fever, with presenting chest x-ray showing interval development of right lower lobe infiltrate concerning for pneumonia.  As the patient was recently hospitalized during which he received IV antibiotics, criteria met for expansion of spectrum of IV antibiotic coverage to include resistant organism, including indication for MRSA/antipseudomonal coverage. ? ?SIRS criteria met via leukocytosis, tachypnea. Lactic acid level to be checked now. Of note, given the associated presence of suspected end organ damage in the form of concominant presenting acute hypoxic respiratory failure, criteria are met for pt's sepsis to be considered severe in nature. However, in the absence of lactic acid level that is greater than or equal to 4.0, and in the absence of any associated hypotension refractory to IVF's, there are no indications for administration of a 30 mL/kg IVF bolus at this time.  ? ?Additional ED work-up/management notable for: Initiation of IV vancomycin and cefepime, which we continued given the above rationale for expansion of IV antibiotic spectrum of coverage.  Of note, CTA chest also ordered by EDP, with result currently pending. ? ? ? ? ?Plan: CBC w/ diff and CMP in AM.  Check blood cx's x 2. Abx: Continue IV vancomycin and cefepime, as above.  Stat lactic acid level now.  Add on procalcitonin level.  Check MRSA PCR, strep urine antigen.  Flutter valve, NSAIDs from a tree.  Monitor continuous pulse oximetry.  Guaifenesin.  Further evaluation management of ensuing acute COPD exacerbation, as further detailed below.  Follow-up result of CTA chest.  Check urinalysis. ? ? ?

## 2021-06-04 NOTE — Progress Notes (Signed)
?PROGRESS NOTE ? ? ? ?[name]  OFH:[id number] DOB: [birth year] DOA: 06/03/2021 ?PCP: [facility]  ? ? ?Brief Narrative:  ? 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 ? ?Patient recently discharged from [facility] in which she was hospitalized 05/21/2021 through 05/24/2021 on the internal medicine teaching service for acute COPD exacerbation in the setting of community-acquired pneumonia.  Patient was treated with breathing treatments, steroids and antibiotics x5 days.  Following return home, continued with progressive shortness of breath.  Patient is prescribed continuous oxygen, but patient reports that he has not received an oxygen concentrator from the [place] to support this as of yet.  Patient also reports productive cough and subjective fever.  Denies chest pain, no diaphoresis, no nausea/vomiting/diarrhea, no chills/night sweats, no abdominal pain, no rash, no headache, no new lower extremity erythema or calf tenderness. ? ?In the ED, temperature 97.4 ?F, HR 87, RR 24, BP 1 3476, SPO2 90% on 4 L nasal cannula.  WBC 13.7, hemoglobin 14.5, platelets 359.  Sodium 133, potassium 4.1, chloride 99, CO2 23, glucose 188, BUN 17, creatinine 0.99.  BNP 29.6, high sensitive troponin 5.  COVID-19 PCR negative.  Influenza A/B PCR negative.  Chest x-ray with left pleural effusion with basilar consolidation versus atelectasis, unchanged.  Developing infiltration versus atelectasis right lung base.  CT angiogram chest with slightly prominent main pulmonary arteries, no arterial embolic filling defect, left lower lobe consolidation  continues to be seen, not significant change from 16 days prior but with increased underlying left pleural effusion which is predominantly subpulmonic and may be at least partially loculated with partial fascial extension of the fluid, no enhancing pleural rind around the fluid to suspect empyema and fluid is low in density consistent with transudative rather than exudative effusion, persistence of 1.5 cm fissural nodule or fluid in the lingular base, advanced emphysematous change, aortic/coronary artery atherosclerosis.  Patient was given albuterol neb, cefepime, IV vancomycin.  Hospital service consulted for further evaluation management of acute on chronic respiratory failure secondary to right lower lobe pneumonia complicated by acute COPD exacerbation. ? ?Assessment & Plan: ?  ? ?Acute on chronic hypoxic respiratory failure, POA ?Patient presenting to ED with progressive shortness of breath over the last 3-4 days associated with wheezing.  Etiology likely multifactorial with persistent pneumonia, now developing right lower lobe with acute COPD exacerbation.  Patient on 3 L nasal cannula at baseline, also concerned that patient is not receiving his home oxygen as needed. ?--Continue treatment of pneumonia and COPD exacerbation as below ?--Strict titrate supplemental oxygen to maintain SPO2 greater than 88%, 3 L nasal cannula baseline ?--TOC for home O2 needs ? ?Community-acquired pneumonia ?Patient recently hospitalized and completed 5-day course of antibiotics for commune acquired pneumonia with Augmentin and azithromycin.  Complicated by immunocompromise state due to treatment of ankylosing spondylitis/uveitis with methotrexate and mycophenolate.  Imaging studies show persistent consolidative process left lower lobe with new development of a right lower lobe infiltrate.  Patient with elevated WBC count of 13.7.  BNP within normal limits.  MRSA PCR negative, vancomycin discontinued. ?--Blood cultures x2:  Pending ?--Cefepime ?--Supplemental oxygen, maintain SPO2 greater than  88% ?--Mucinex 600 mg p.o. twice daily ?--Flutter valve, incentive spirometry ?--CBC daily ? ?Acute COPD exacerbation ?Patient reports progressive wheezing over the last 3-4 days.  Not improved with home nebulizer.  Reports compliance with his home inhalers. ?--Brovana neb twice daily ?--Pulmicort neb twice daily ?--Solu-Medrol 80 mg IV every 12 hours ?--Albuterol/DuoNebs as needed ?--Continue supplemental oxygen as above, baseline 3 L nasal cannula ? ?Essential hypertension ?--Cardizem CD1 180 mg p.o. daily ?--Lisinopril 20 mg p.o. daily ? ?Type 2 diabetes mellitus ?Home regimen includes metformin 500 mg p.o. twice daily. ?--Hold oral hypoglycemics while inpatient ?--SSI for coverage ?--CBGs before every meal/at bedtime ? ?History of pulmonary embolism ?Diagnosed December 2022.  Remains on Eliquis, no missed doses.  CT angiogram chest on admission negative for PE. ?--Continue Eliquis 5 g p.o. twice daily ? ?Depression/anxiety ?--Trileptal ?--Latuda ?--Bupropion ?--BuSpar ? ?BPH ?--Tamsulosin 0.4 mg p.o. daily ? ?OSA ?--Continue nocturnal CPAP ? ?Hx non-small cell lung cancer stage II s/p radiation ?Outpatient follow-up with oncology. ? ?History of ankylosing spondylitis/uveitis ?Chronic, stable.  Continue home methotrexate and mycophenolate. ? ?Physical deconditioning: ?--PT/OT evaluation tomorrow morning ? ?Morbid obesity ?Body mass index is 43.65 kg/m?[name]  Discussed with patient needs for aggressive lifestyle changes/weight loss as this complicates all facets of care.  Outpatient follow-up with PCP.   ? ? ? ?DVT prophylaxis: SCDs Start: 06/04/21 0139 ?apixaban (ELIQUIS) tablet 5 mg  ?  Code Status: Full Code ?Family Communication:  ? ?Disposition Plan:  ?Level of care: Progressive ?Status is: Inpatient ?Remains inpatient appropriate because: IV antibiotics ?  ? ?Consultants:  ?None ? ?Procedures:  ?None ? ?Antimicrobials:  ?Vancomycin 4/6 -  4/7 ?Cefepime 4/6>> ? ? ?Subjective: ?Patient seen examined bedside, resting calmly.  Continues with shortness of breath greater than his typical baseline.  Currently on 3.5 L nasal cannula with a baseline of 3 L.  Continues with wheezing.  Reports he has yet to receive his oxygen concentrator from the [place] and does not have continuous oxygen which is prescribed for him at this time at home.  No other specific complaints or concerns at this time.  Denies headache, no chest pain, no palpitations, no dizziness, no vision changes, no abdominal pain, no fever/chills/night sweats, no nausea/vomiting/diarrhea, no paresthesias.  No acute events overnight per nursing staff. ? ?Objective: ?Vitals:  ? 06/04/21 0900 06/04/21 1000 06/04/21 1054 06/04/21 1100  ?BP: (!) 150/75 (!) 161/79  (!) 161/82  ?Pulse: (!) 101 (!) 106  (!) 109  ?Resp: 18 18  18   ?Temp:      ?TempSrc:      ?SpO2: 93% 92% 93% 97%  ?Weight:      ?Height:      ? ?No intake or output data in the 24 hours ending 06/04/21 1213 ?Filed Weights  ? 06/03/21 2256  ?Weight: (!) 154.2 kg  ? ? ?Examination: ? ?Physical Exam: ?GEN: NAD, alert and oriented x 3, obese ?HEENT: NCAT, PERRL, EOMI, sclera clear, MMM ?PULM: Diminished breath sounds bilateral bases with crackles on right, wheezing throughout, normal respiratory effort without accessory muscle use, on 3.5 L nasal cannula ?CV: RRR w/o M/G/R ?GI: abd soft, NTND, NABS, no R/G/M ?MSK: Trace bilateral peripheral edema lower extremities, muscle strength globally intact 5/5 bilateral upper/lower extremities ?NEURO: CN II-XII intact, no focal deficits, sensation to light touch intact ?PSYCH: normal mood/affect ?Integumentary: dry/intact, no rashes or wounds ? ? ? ?Data Reviewed: I have personally reviewed following labs and imaging studies ? ?CBC: ?Recent Labs  ?Lab 06/03/21 ?2335 06/04/21 ?  0500  ?WBC 13.7* 12.1*  ?NEUTROABS  --  11.5*  ?HGB 14.5 14.5  ?HCT 43.4 43.0  ?MCV 96.0 97.5  ?PLT 359 371  ? ?Basic Metabolic  Panel: ?Recent Labs  ?Lab 06/03/21 ?2335 06/04/21 ?0115 06/04/21 ?0500  ?NA 133*  --  129*  ?K 4.1  --  5.1  ?CL 99  --  96*  ?CO2 23  --  20*  ?GLUCOSE 188*  --  295*  ?BUN 17  --  18  ?CREATININE 0.99  --  1.24  ?CALCIUM 9.

## 2021-06-04 NOTE — ED Provider Notes (Signed)
?[facility] DEPT ?Provider Note ? ? ?CSN: [id number] ?Arrival date & time: 06/03/21  2248 ? ?  ? ?History ? ?Chief Complaint  ?Patient presents with  ? Shortness of Breath  ? ? ?Mario [name] is a 71 y.o. male. ? ?The history is provided by the patient.  ?Shortness of Breath ?Severity:  Severe ?Onset quality:  Gradual ?Duration: days. ?Timing:  Constant ?Progression:  Worsening ?Chronicity:  Recurrent ?Context: not weather changes   ?Relieved by:  Nothing ?Worsened by:  Nothing ?Ineffective treatments:  None tried ?Associated symptoms: wheezing   ?Associated symptoms: no abdominal pain, no fever and no rash   ?Risk factors: no recent surgery   ?Patient with COPD with hypoxia at home.  Does not have an order for O2 24/7.  Has cough and wheezing used nebs once today.   ?  ? ?Home Medications ?Prior to Admission medications   ?Medication Sig Start Date End Date Taking? Authorizing Provider  ?acetaminophen (TYLENOL) 500 MG tablet Take 1,000 mg by mouth every 6 (six) hours as needed for moderate pain.    [provider]  ?albuterol (PROVENTIL) (2.5 MG/3ML) 0.083% nebulizer solution Inhale 3 mLs (2.5 mg total) by nebulization every 4 (four) hours as needed for wheezing or shortness of breath. ?Patient taking differently: Take 2.5 mg by nebulization every 6 (six) hours as needed for wheezing or shortness of breath. 08/04/20 08/04/21  Lavina Hamman, MD  ?albuterol (VENTOLIN HFA) 108 (90 Base) MCG/ACT inhaler Inhale 2 puffs into the lungs every 6 (six) hours as needed for wheezing. 06/02/20   [provider]  ?apixaban (ELIQUIS) 5 MG TABS tablet Take 5 mg by mouth 2 (two) times daily.    [provider]  ?aspirin 81 MG EC tablet Take 81 mg by mouth daily.    [provider]  ?b complex vitamins capsule Take 1 capsule by mouth daily.    [provider]  ?bisacodyl (DULCOLAX) 5 MG EC tablet Take 1 tablet (5 mg total) by mouth daily at 12 noon. ?Patient  taking differently: Take 5 mg by mouth daily. 08/04/20   Lavina Hamman, MD  ?buPROPion (WELLBUTRIN XL) 300 MG 24 hr tablet Take 300 mg by mouth daily. 02/05/20   [provider]  ?busPIRone (BUSPAR) 15 MG tablet Take 15 mg by mouth daily.    [provider]  ?calcium-vitamin D (OSCAL WITH D) 500-200 MG-UNIT tablet Take 1 tablet by mouth 2 (two) times daily with a meal.    [provider]  ?carboxymethylcellulose (REFRESH PLUS) 0.5 % SOLN 1 drop 4 (four) times daily as needed (dry eyes).    [provider]  ?Cholecalciferol (VITAMIN D-3) 25 MCG (1000 UT) CAPS Take 1,000 Units by mouth daily.    [provider]  ?clobetasol ointment (TEMOVATE) 1.88 % Apply 1 application. topically 2 (two) times daily as needed (itching).    [provider]  ?clotrimazole-betamethasone (LOTRISONE) cream Apply topically 2 (two) times daily. 05/21/21   Farrel Gordon, DO  ?cyclobenzaprine (FLEXERIL) 10 MG tablet Take 10-30 mg by mouth at bedtime. 30mg  at bedtime, 10mg  during the day    [provider]  ?cycloSPORINE (RESTASIS) 0.05 % ophthalmic emulsion Place 1 drop into both eyes 2 (two) times daily.    [provider]  ?diltiazem (CARDIZEM CD) 180 MG 24 hr capsule Take 180 mg by mouth daily.    [provider]  ?docusate sodium (COLACE) 100 MG capsule Take 2 capsules (200 mg  total) by mouth 2 (two) times daily. ?Patient taking differently: Take 200 mg by mouth daily. 08/04/20   Lavina Hamman, MD  ?DULoxetine (CYMBALTA) 60 MG capsule Take 1 capsule (60 mg total) by mouth 2 (two) times daily. 07/21/17   Lavina Hamman, MD  ?feeding supplement (ENSURE ENLIVE / ENSURE PLUS) LIQD Take 237 mLs by mouth 3 (three) times daily between meals. ?Patient taking differently: Take 237 mLs by mouth daily. 08/04/20   Lavina Hamman, MD  ?fluticasone Asencion Islam) 50 MCG/ACT nasal spray Place 2 sprays into both nostrils daily.     [provider]  ?folic acid (FOLVITE) 1 MG  tablet Take 1 mg by mouth daily.    [provider]  ?gabapentin (NEURONTIN) 300 MG capsule Take 900 mg by mouth at bedtime.    [provider]  ?GARLIC OIL PO Take 1 capsule by mouth daily.    [provider]  ?guaiFENesin (MUCINEX) 600 MG 12 hr tablet Take 1 tablet (600 mg total) by mouth 2 (two) times daily. 05/21/21   Farrel Gordon, DO  ?lisinopril (ZESTRIL) 20 MG tablet Take 20 mg by mouth daily.    [provider]  ?lurasidone (LATUDA) 40 MG TABS tablet Take 40 mg by mouth daily after supper.    [provider]  ?Melatonin 3 MG TABS Take 6 mg by mouth at bedtime.    [provider]  ?metFORMIN (GLUCOPHAGE) 500 MG tablet Take 500 mg by mouth 2 (two) times daily with a meal.    [provider]  ?methotrexate (RHEUMATREX) 2.5 MG tablet Take 15 mg by mouth every Saturday.    [provider]  ?Mometasone Furoate 200 MCG/ACT AERO Inhale 2 puffs into the lungs daily.    [provider]  ?Multiple Vitamin (MULTIVITAMIN WITH MINERALS) TABS tablet Take 1 tablet by mouth daily.    [provider]  ?mycophenolate (CELLCEPT) 250 MG capsule Take 1,500 mg by mouth 2 (two) times daily.    [provider]  ?nitroGLYCERIN (NITROSTAT) 0.4 MG SL tablet Place 1 tablet (0.4 mg total) under the tongue every 5 (five) minutes as needed for chest pain. 07/03/19   Danford, Suann Larry, MD  ?Olodaterol HCl (STRIVERDI RESPIMAT) 2.5 MCG/ACT AERS Inhale 2 puffs into the lungs daily.    [provider]  ?OVER THE COUNTER MEDICATION Take 2 each by mouth at bedtime as needed (sleep). Pure Zzz    [provider]  ?oxcarbazepine (TRILEPTAL) 600 MG tablet Take 600-900 mg by mouth See admin instructions. Take 600mg  twice daily and 900mg  at bedtime 07/16/20   [provider]  ?OXYGEN Inhale 3 L into the lungs at bedtime.    [provider]  ?Polyethyl Glycol-Propyl Glycol (SYSTANE OP) Apply 1 drop to eye daily as needed  (dry eye).    [provider]  ?polyethylene glycol (MIRALAX / GLYCOLAX) 17 g packet Take 17 g by mouth daily.    [provider]  ?prednisoLONE acetate (PRED FORTE) 1 % ophthalmic suspension Place 1 drop into the right eye every hour.    [provider]  ?PRESCRIPTION MEDICATION Inhale into the lungs See admin instructions. CPAP- At bedtime and during during any naps    [provider]  ?sodium chloride (OCEAN) 0.65 % SOLN nasal spray Place 2 sprays into both nostrils 4 (four) times daily as needed for congestion.    [provider]  ?tamsulosin (FLOMAX) 0.4 MG CAPS capsule Take 0.4 mg by mouth daily.  [provider]  ?traMADol (ULTRAM) 50 MG tablet Take 100 mg by mouth at bedtime as needed for moderate pain.    [provider]  ?traZODone (DESYREL) 50 MG tablet Take 75 mg by mouth at bedtime.    [provider]  ?VITAMIN A PO Take 1 tablet by mouth daily.    [provider]  ?vitamin C (ASCORBIC ACID) 500 MG tablet Take 500 mg by mouth daily.    [provider]  ?   ? ?Allergies    ?Demerol [meperidine] and Zocor [simvastatin]   ? ?Review of Systems   ?Review of Systems  ?Constitutional:  Negative for fever.  ?HENT:  Negative for facial swelling.   ?Eyes:  Negative for redness.  ?Respiratory:  Positive for shortness of breath and wheezing.   ?Cardiovascular:  Negative for palpitations.  ?Gastrointestinal:  Negative for abdominal pain.  ?Genitourinary:  Negative for difficulty urinating.  ?Musculoskeletal:  Negative for neck stiffness.  ?Skin:  Negative for rash.  ?Neurological:  Negative for facial asymmetry.  ? ?Physical Exam ?Updated Vital Signs ?BP 137/76   Pulse 79   Temp (!) 97.4 ?F (36.3 ?C) (Oral)   Resp 16   Ht 6\' 2"  (1.88 m)   Wt (!) 154.2 kg   SpO2 92%   BMI 43.65 kg/m?  ?Physical Exam ?Vitals and nursing note reviewed.  ?Constitutional:   ?   General: He is not in acute distress. ?   Appearance: Normal  appearance.  ?HENT:  ?   Head: Normocephalic and atraumatic.  ?   Nose: Nose normal.  ?Eyes:  ?   Conjunctiva/sclera: Conjunctivae normal.  ?   Pupils: Pupils are equal, round, and reactive to light.  ?Cardiovas

## 2021-06-04 NOTE — Progress Notes (Signed)
Pharmacy Antibiotic Note ? ?[name] is a 71 y.o. male admitted on 06/03/2021 with shortness of breath.  Pharmacy has been consulted to dose vancomycin and cefepime for pna ? ?Plan: ?Vancomycin 2gm IV x 1 then 1500mg  q12h (AUC 537.7, Scr 0.99) ?Cefepime 2gm IV q8h ?Follow renal function, cultures and clinical course ? ?Height: 6\' 2"  (188 cm) ?Weight: (!) 154.2 kg (340 lb) ?IBW/kg (Calculated) : 82.2 ? ?Temp (24hrs), Avg:97.4 ?F (36.3 ?C), Min:97.4 ?F (36.3 ?C), Max:97.4 ?F (36.3 ?C) ? ?Recent Labs  ?Lab 06/03/21 ?2335  ?WBC 13.7*  ?CREATININE 0.99  ?  ?Estimated Creatinine Clearance: 110.6 mL/min (by C-G formula based on SCr of 0.99 mg/dL).   ? ?Allergies  ?Allergen Reactions  ? Demerol [Meperidine] Nausea And Vomiting and Other (See Comments)  ?  Made the patient "violently sick"  ? Zocor [Simvastatin] Nausea And Vomiting and Other (See Comments)  ?  Made him very jittery, also  ? ? ?Antimicrobials this admission: ?4/7 vanc >> ? ?Dose adjustments this admission: ? ? ?Microbiology results: ? ?Thank you for allowing pharmacy to be a part of this patient?s care. ? ?Mario [name] RPh ?06/04/2021, 1:21 AM ? ? ?

## 2021-06-04 NOTE — Assessment & Plan Note (Signed)
? ? ? ?#)   Type 2 Diabetes Mellitus: documented history of such. Home insulin regimen: None. Home oral hypoglycemic agents: Metformin. presenting blood sugar: 188.  ? ? ?Plan: accuchecks QAC and HS with low dose SSI. hold home oral hypoglycemic agents during this hospitalization.  ? ?

## 2021-06-04 NOTE — Assessment & Plan Note (Signed)
? ?#)   Acute COPD exacerbation: in the context of a documented history of COPD, diagnosis of acute exacerbation on the basis of 3 to 4 days of progressive shortness of breath associate with new onset cough, which appears to be on the basis of interval development of right lower lobe concerning for pneumonia per today's chest x-ray, as further detailed above , without corresponding evidence of edema, effusion, or pneumothorax.  no clinical or radiographic evidence to suggest acutely decompensated heart failure at this time. Additionally, ACS appears less likely in the absence of chest pain, with EKG showing no evidence of acute ischemic changes, and with negative Troponin results.  Of note, patient has a history of acute pulmonary embolism diagnosed in December 2022, with reported good ensuing compliance with ongoing Eliquis therapy.  Of note, COVID-19/influenza PCR negative. ? ? ?Plan: monitor continuous pulse oxymetry. Monitor on telemetry. Solumedrol. Scheduled duonebs q6 hours. Prn albuterol inhaler. BMP in the morning. Repeat CBC in the morning. Check serum Mg and Phos levels. Will attempt additional chart review to evaluate most recent PFT results.  Further evaluation management of presenting severe sepsis due to right lower lobe pneumonia, including broad-spectrum IV antibiotics, as above check blood gas. Add-on procalcitonin.  ? ? ? ?

## 2021-06-04 NOTE — Assessment & Plan Note (Signed)
? ? ? ?#)   Allergic Rhinitis: documented h/o such, on scheduled intranasal Flonase as outpatient.  ? ? ?Plan: cont home Flonase.  ? ? ?

## 2021-06-04 NOTE — ED Notes (Signed)
Respiratory made aware of patient's need for breathing treatment.  ?

## 2021-06-05 DIAGNOSIS — J189 Pneumonia, unspecified organism: Secondary | ICD-10-CM | POA: Diagnosis not present

## 2021-06-05 DIAGNOSIS — J9601 Acute respiratory failure with hypoxia: Secondary | ICD-10-CM | POA: Diagnosis not present

## 2021-06-05 DIAGNOSIS — J9621 Acute and chronic respiratory failure with hypoxia: Secondary | ICD-10-CM | POA: Diagnosis not present

## 2021-06-05 DIAGNOSIS — J301 Allergic rhinitis due to pollen: Secondary | ICD-10-CM | POA: Diagnosis not present

## 2021-06-05 LAB — BASIC METABOLIC PANEL
Anion gap: 11 (ref 5–15)
BUN: 17 mg/dL (ref 8–23)
CO2: 21 mmol/L — ABNORMAL LOW (ref 22–32)
Calcium: 8.8 mg/dL — ABNORMAL LOW (ref 8.9–10.3)
Chloride: 97 mmol/L — ABNORMAL LOW (ref 98–111)
Creatinine, Ser: 0.87 mg/dL (ref 0.61–1.24)
GFR, Estimated: >60 mL/min (ref >60)
Glucose, Bld: 264 mg/dL — ABNORMAL HIGH (ref 70–99)
Potassium: 4.5 mmol/L (ref 3.5–5.1)
Sodium: 129 mmol/L — ABNORMAL LOW (ref 135–145)

## 2021-06-05 LAB — CBC
HCT: 40.6 % (ref 39.0–52.0)
Hemoglobin: 13.7 g/dL (ref 13.0–17.0)
MCH: 32.3 pg (ref 26.0–34.0)
MCHC: 33.7 g/dL (ref 30.0–36.0)
MCV: 95.8 fL (ref 80.0–100.0)
Platelets: 344 10*3/uL (ref 150–400)
RBC: 4.24 MIL/uL (ref 4.22–5.81)
RDW: 13.9 % (ref 11.5–15.5)
WBC: 15.8 10*3/uL — ABNORMAL HIGH (ref 4.0–10.5)
nRBC: 0 % (ref 0.0–0.2)

## 2021-06-05 LAB — GLUCOSE, CAPILLARY
Glucose-Capillary: 275 mg/dL — ABNORMAL HIGH (ref 70–99)
Glucose-Capillary: 320 mg/dL — ABNORMAL HIGH (ref 70–99)
Glucose-Capillary: 432 mg/dL — ABNORMAL HIGH (ref 70–99)
Glucose-Capillary: 259 mg/dL — ABNORMAL HIGH (ref 70–99)
Glucose-Capillary: 321 mg/dL — ABNORMAL HIGH (ref 70–99)

## 2021-06-05 LAB — MAGNESIUM: Magnesium: 1.9 mg/dL (ref 1.7–2.4)

## 2021-06-05 LAB — LACTIC ACID, PLASMA: Lactic Acid, Venous: 2.8 mmol/L (ref 0.5–1.9)

## 2021-06-05 MED ORDER — PHENOL 1.4 % MT LIQD
1.0000 | OROMUCOSAL | Status: DC | PRN
  Administered 2021-06-05: 1 via OROMUCOSAL
  Filled 2021-06-05: qty 177

## 2021-06-05 MED ORDER — POLYETHYLENE GLYCOL 3350 17 G PO PACK
17.0000 g | PACK | Freq: Every day | ORAL | Status: DC
  Administered 2021-06-05 – 2021-06-10 (×6): 17 g via ORAL
  Filled 2021-06-05 (×6): qty 1

## 2021-06-05 MED ORDER — BENZOCAINE 10 % MT GEL
Freq: Three times a day (TID) | OROMUCOSAL | Status: DC | PRN
  Filled 2021-06-05: qty 9.4

## 2021-06-05 MED ORDER — NYSTATIN 100000 UNIT/GM EX POWD
Freq: Two times a day (BID) | CUTANEOUS | Status: DC
  Administered 2021-06-09: 1 via TOPICAL
  Filled 2021-06-05: qty 15

## 2021-06-05 MED ORDER — INSULIN ASPART 100 UNIT/ML IJ SOLN
0.0000 [IU] | Freq: Three times a day (TID) | INTRAMUSCULAR | Status: DC
  Administered 2021-06-05: 8 [IU] via SUBCUTANEOUS
  Administered 2021-06-05 – 2021-06-06 (×2): 15 [IU] via SUBCUTANEOUS
  Administered 2021-06-06 (×2): 8 [IU] via SUBCUTANEOUS
  Administered 2021-06-07: 5 [IU] via SUBCUTANEOUS
  Administered 2021-06-07 (×2): 8 [IU] via SUBCUTANEOUS
  Administered 2021-06-08 (×2): 5 [IU] via SUBCUTANEOUS
  Administered 2021-06-08: 8 [IU] via SUBCUTANEOUS
  Administered 2021-06-09: 3 [IU] via SUBCUTANEOUS
  Administered 2021-06-09 (×2): 5 [IU] via SUBCUTANEOUS
  Administered 2021-06-10: 2 [IU] via SUBCUTANEOUS
  Administered 2021-06-10 – 2021-06-11 (×3): 3 [IU] via SUBCUTANEOUS

## 2021-06-05 MED ORDER — IPRATROPIUM-ALBUTEROL 0.5-2.5 (3) MG/3ML IN SOLN
3.0000 mL | Freq: Three times a day (TID) | RESPIRATORY_TRACT | Status: DC
  Administered 2021-06-06 – 2021-06-11 (×14): 3 mL via RESPIRATORY_TRACT
  Filled 2021-06-05 (×15): qty 3

## 2021-06-05 NOTE — Plan of Care (Signed)
  Problem: Education: Goal: Knowledge of General Education information will improve Description: Including pain rating scale, medication(s)/side effects and non-pharmacologic comfort measures Outcome: Progressing   Problem: Clinical Measurements: Goal: Respiratory complications will improve Outcome: Progressing   Problem: Activity: Goal: Risk for activity intolerance will decrease Outcome: Progressing   

## 2021-06-05 NOTE — Progress Notes (Signed)
Pt refused bed alarm activation. Pt educated on fall prevention and associated risk. Charge Nurse Annetta Maw notified.  ?

## 2021-06-05 NOTE — Progress Notes (Signed)
?PROGRESS NOTE ? ? ? ?Mario [name]  UQJ:[id number] DOB: 1950-07-30 DOA: 06/03/2021 ?PCP: [facility]  ? ? ?Brief Narrative:  ? 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 ? ?Patient recently discharged from [facility] in which she was hospitalized 05/21/2021 through 05/24/2021 on the internal medicine teaching service for acute COPD exacerbation in the setting of community-acquired pneumonia.  Patient was treated with breathing treatments, steroids and antibiotics x5 days.  Following return home, continued with progressive shortness of breath.  Patient is prescribed continuous oxygen, but patient reports that he has not received an oxygen concentrator from the [place] to support this as of yet.  Patient also reports productive cough and subjective fever.  Denies chest pain, no diaphoresis, no nausea/vomiting/diarrhea, no chills/night sweats, no abdominal pain, no rash, no headache, no new lower extremity erythema or calf tenderness. ? ?In the ED, temperature 97.4 ?F, HR 87, RR 24, BP 1 3476, SPO2 90% on 4 L nasal cannula.  WBC 13.7, hemoglobin 14.5, platelets 359.  Sodium 133, potassium 4.1, chloride 99, CO2 23, glucose 188, BUN 17, creatinine 0.99.  BNP 29.6, high sensitive troponin 5.  COVID-19 PCR negative.  Influenza A/B PCR negative.  Chest x-ray with left pleural effusion with basilar consolidation versus atelectasis, unchanged.  Developing infiltration versus atelectasis right lung base.  CT angiogram chest with slightly prominent main pulmonary arteries, no arterial embolic filling defect, left lower lobe consolidation continues to be  seen, not significant change from 16 days prior but with increased underlying left pleural effusion which is predominantly subpulmonic and may be at least partially loculated with partial fascial extension of the fluid, no enhancing pleural rind around the fluid to suspect empyema and fluid is low in density consistent with transudative rather than exudative effusion, persistence of 1.5 cm fissural nodule or fluid in the lingular base, advanced emphysematous change, aortic/coronary artery atherosclerosis.  Patient was given albuterol neb, cefepime, IV vancomycin.  Hospital service consulted for further evaluation management of acute on chronic respiratory failure secondary to right lower lobe pneumonia complicated by acute COPD exacerbation. ? ?06/05/2021: Patient feels 40% improved.  Patient continues to report shortness of breath weight activities.  Not associated fever or chills.  No productive cough. ? ?Assessment & Plan: ?  ? ?Acute on chronic hypoxic respiratory failure, POA ?Patient presenting to ED with progressive shortness of breath over the last 3-4 days associated with wheezing.  Etiology likely multifactorial with persistent pneumonia, now developing right lower lobe with acute COPD exacerbation.  Patient on 3 L nasal cannula at baseline, also concerned that patient is not receiving his home oxygen as needed. ?--Continue treatment of pneumonia and COPD exacerbation as below ?--Strict titrate supplemental oxygen to maintain SPO2 greater than 88%, 3 L nasal cannula baseline ?--TOC for home O2 needs ?06/05/2021: Continue IV Solu-Medrol, nebs DuoNeb and Pulmicort and antibiotics.  Will add incentive spirometry and flutter valve device therapy. ? ?Community-acquired pneumonia ?Patient recently hospitalized and completed 5-day course of antibiotics for commune acquired pneumonia with Augmentin and azithromycin.  Complicated by immunocompromise state due to treatment of ankylosing spondylitis/uveitis with  methotrexate and mycophenolate.  Imaging studies show persistent  consolidative process left lower lobe with new development of a right lower lobe infiltrate.  Patient with elevated WBC count of 13.7.  BNP within normal limits.  MRSA PCR negative, vancomycin discontinued. ?--Blood cultures x2: Pending ?--Cefepime ?--Supplemental oxygen, maintain SPO2 greater than 88% ?--Mucinex 600 mg p.o. twice daily ?--Flutter valve, incentive spirometry ?--CBC daily ?06/05/2021: Continue current antibiotics. ? ? ?Acute COPD exacerbation ?Patient reports progressive wheezing over the last 3-4 days.  Not improved with home nebulizer.  Reports compliance with his home inhalers. ?--Brovana neb twice daily ?--Pulmicort neb twice daily ?--Solu-Medrol 80 mg IV every 12 hours ?--Albuterol/DuoNebs as needed ?--Continue supplemental oxygen as above, baseline 3 L nasal cannula ? ?Essential hypertension ?--Cardizem CD1 180 mg p.o. daily ?--Lisinopril 20 mg p.o. daily ? ?Type 2 diabetes mellitus ?Home regimen includes metformin 500 mg p.o. twice daily. ?--Hold oral hypoglycemics while inpatient ?--SSI for coverage ?--CBGs before every meal/at bedtime ? ?History of pulmonary embolism ?Diagnosed December 2022.  Remains on Eliquis, no missed doses.  CT angiogram chest on admission negative for PE. ?--Continue Eliquis 5 g p.o. twice daily ? ?Depression/anxiety ?--Trileptal ?--Latuda ?--Bupropion ?--BuSpar ? ?BPH ?--Tamsulosin 0.4 mg p.o. daily ? ?OSA ?--Continue nocturnal CPAP ? ?Hx non-small cell lung cancer stage II s/p radiation ?Outpatient follow-up with oncology. ? ?History of ankylosing spondylitis/uveitis ?Chronic, stable.  Continue home methotrexate and mycophenolate. ? ?Physical deconditioning: ?--PT/OT evaluation tomorrow morning ? ?Morbid obesity ?Body mass index is 43.65 kg/m?[name]  Discussed with patient needs for aggressive lifestyle changes/weight loss as this complicates all facets of care.  Outpatient follow-up with PCP.   ? ? ? ?DVT  prophylaxis: SCDs Start: 06/04/21 0139 ?apixaban (ELIQUIS) tablet 5 mg  ?  Code Status: Full Code ?Family Communication:  ? ?Disposition Plan:  ?Level of care: Telemetry ?Status is: Inpatient ?Remains inpatient appropriate because: IV antibiotics ?  ? ?Consultants:  ?None ? ?Procedures:  ?None ? ?Antimicrobials:  ?Vancomycin 4/6 - 4/7 ?Cefepime 4/6>> ? ? ?Subjective: ?Patient seen alongside patient's nurse. ?Patient continues to report shortness of breath. ? ?Objective: ?Vitals:  ? 06/05/21 0822 06/05/21 0825 06/05/21 1209 06/05/21 1421  ?BP:  (!) 164/88 (!) 159/91   ?Pulse:  (!) 109 (!) 103   ?Resp:  20 20   ?Temp:  97.8 ?F (36.6 ?C)    ?TempSrc:  Oral    ?SpO2: 94% 97% 96% 95%  ?Weight:      ?Height:      ? ? ?Intake/Output Summary (Last 24 hours) at 06/05/2021 1441 ?Last data filed at 06/05/2021 1100 ?Gross per 24 hour  ?Intake 1100 ml  ?Output --  ?Net 1100 ml  ? ?Filed Weights  ? 06/03/21 2256  ?Weight: (!) 154.2 kg  ? ? ?Examination: ? ?Physical Exam: ?GEN: Patient is morbidly obese.  Patient is awake and alert. ?HEENT: Patient is pale.  No jaundice. ?Neck: Supple.  No raised JVD. ?Lungs: Decreased air entry. ?CVS: S1-S2. ?Abdomen: Morbidly obese.  Soft and nontender. ?Neuro: Awake and alert.  Moves all extremities. ?Extremities: Fullness of the ankle.   ? ?Data Reviewed: I have personally reviewed following labs and imaging studies ? ?CBC: ?Recent Labs  ?Lab 06/03/21 ?2335 06/04/21 ?0500 06/05/21 ?8185  ?WBC 13.7* 12.1* 15.8*  ?NEUTROABS  --  11.5*  --   ?HGB 14.5 14.5 13.7  ?HCT 43.4 43.0 40.6  ?MCV 96.0 97.5 95.8  ?PLT 359 371 344  ? ? ?Basic Metabolic Panel: ?Recent Labs  ?Lab 06/03/21 ?2335 06/04/21 ?0115 06/04/21 ?0500 06/05/21 ?6314  ?NA 133*  --  129* 129*  ?K 4.1  --  5.1 4.5  ?CL 99  --  96* 97*  ?CO2 23  --  20* 21*  ?GLUCOSE 188*  --  295* 264*  ?BUN 17  --  18 17  ?CREATININE 0.99  --  1.24 0.87  ?CALCIUM 9.5  --  9.4 8.8*  ?MG  --  1.9 2.0 1.9  ?PHOS  --   --  3.1  --   ? ? ?GFR: ?Estimated  Creatinine Clearance: 125.8 mL/min (by C-G formula based on SCr of 0.87 mg/dL). ?Liver Function Tests: ?Recent Labs  ?Lab 06/04/21 ?0500  ?AST 25  ?ALT 27  ?ALKPHOS 84  ?BILITOT 0.4  ?PROT 7.7  ?ALBUMIN 4.2  ? ? ?No res

## 2021-06-06 DIAGNOSIS — J301 Allergic rhinitis due to pollen: Secondary | ICD-10-CM | POA: Diagnosis not present

## 2021-06-06 DIAGNOSIS — J9601 Acute respiratory failure with hypoxia: Secondary | ICD-10-CM | POA: Diagnosis not present

## 2021-06-06 DIAGNOSIS — J9621 Acute and chronic respiratory failure with hypoxia: Secondary | ICD-10-CM | POA: Diagnosis not present

## 2021-06-06 DIAGNOSIS — J189 Pneumonia, unspecified organism: Secondary | ICD-10-CM | POA: Diagnosis not present

## 2021-06-06 LAB — GLUCOSE, CAPILLARY
Glucose-Capillary: 264 mg/dL — ABNORMAL HIGH (ref 70–99)
Glucose-Capillary: 365 mg/dL — ABNORMAL HIGH (ref 70–99)
Glucose-Capillary: 260 mg/dL — ABNORMAL HIGH (ref 70–99)
Glucose-Capillary: 334 mg/dL — ABNORMAL HIGH (ref 70–99)

## 2021-06-06 MED ORDER — DOCUSATE SODIUM 100 MG PO CAPS
200.0000 mg | ORAL_CAPSULE | Freq: Every day | ORAL | Status: DC
Start: 2021-06-06 — End: 2021-06-11
  Administered 2021-06-06 – 2021-06-11 (×6): 200 mg via ORAL
  Filled 2021-06-06 (×6): qty 2

## 2021-06-06 MED ORDER — DULOXETINE HCL 60 MG PO CPEP
60.0000 mg | ORAL_CAPSULE | Freq: Two times a day (BID) | ORAL | Status: DC
  Administered 2021-06-06 – 2021-06-11 (×11): 60 mg via ORAL
  Filled 2021-06-06 (×11): qty 1

## 2021-06-06 MED ORDER — KETOTIFEN FUMARATE 0.025 % OP SOLN
1.0000 [drp] | Freq: Two times a day (BID) | OPHTHALMIC | Status: DC
  Filled 2021-06-06 (×3): qty 5

## 2021-06-06 MED ORDER — BISACODYL 5 MG PO TBEC
5.0000 mg | DELAYED_RELEASE_TABLET | Freq: Every day | ORAL | Status: DC
  Administered 2021-06-06 – 2021-06-11 (×6): 5 mg via ORAL
  Filled 2021-06-06 (×6): qty 1

## 2021-06-06 MED ORDER — MELATONIN 5 MG PO TABS
5.0000 mg | ORAL_TABLET | Freq: Every day | ORAL | Status: DC
  Administered 2021-06-07 – 2021-06-10 (×5): 5 mg via ORAL
  Filled 2021-06-06 (×6): qty 1

## 2021-06-06 MED ORDER — CYCLOBENZAPRINE HCL 5 MG PO TABS
5.0000 mg | ORAL_TABLET | Freq: Three times a day (TID) | ORAL | Status: DC | PRN
  Administered 2021-06-06 – 2021-06-09 (×3): 5 mg via ORAL
  Filled 2021-06-06 (×3): qty 1

## 2021-06-06 MED ORDER — TRAZODONE HCL 50 MG PO TABS
75.0000 mg | ORAL_TABLET | Freq: Every day | ORAL | Status: DC
  Administered 2021-06-07 – 2021-06-10 (×5): 75 mg via ORAL
  Filled 2021-06-06 (×6): qty 2

## 2021-06-06 MED ORDER — CYCLOSPORINE 0.05 % OP EMUL
1.0000 [drp] | Freq: Two times a day (BID) | OPHTHALMIC | Status: DC
  Administered 2021-06-06 – 2021-06-11 (×10): 1 [drp] via OPHTHALMIC
  Filled 2021-06-06 (×10): qty 30

## 2021-06-06 MED ORDER — INSULIN GLARGINE-YFGN 100 UNIT/ML ~~LOC~~ SOLN
10.0000 [IU] | Freq: Two times a day (BID) | SUBCUTANEOUS | Status: DC
  Administered 2021-06-06 – 2021-06-11 (×11): 10 [IU] via SUBCUTANEOUS
  Filled 2021-06-06 (×12): qty 0.1

## 2021-06-06 NOTE — Plan of Care (Signed)
?  Problem: Education: ?Goal: Knowledge of General Education information will improve ?Description: Including pain rating scale, medication(s)/side effects and non-pharmacologic comfort measures ?Outcome: Progressing ?  ?Problem: Clinical Measurements: ?Goal: Will remain free from infection ?Outcome: Progressing ?Goal: Respiratory complications will improve ?Outcome: Progressing ?  ?Problem: Nutrition: ?Goal: Adequate nutrition will be maintained ?Outcome: Progressing ?  ?Problem: Coping: ?Goal: Level of anxiety will decrease ?Outcome: Progressing ?  ?

## 2021-06-06 NOTE — Progress Notes (Signed)
?PROGRESS NOTE ? ? ? ?ASHOK SAWAYA  IRW:[id number] DOB: 11-08-1950 DOA: 06/03/2021 ?[facility]: [facility]  ? ? ?Brief Narrative:  ? 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 ? ?Patient recently discharged from [facility] in which she was hospitalized 05/21/2021 through 05/24/2021 on the internal medicine teaching service for acute COPD exacerbation in the setting of community-acquired pneumonia.  Patient was treated with breathing treatments, steroids and antibiotics x5 days.  Following return home, continued with progressive shortness of breath.  Patient is prescribed continuous oxygen, but patient reports that he has not received an oxygen concentrator from the [place] to support this as of yet.  Patient also reports productive cough and subjective fever.  Denies chest pain, no diaphoresis, no nausea/vomiting/diarrhea, no chills/night sweats, no abdominal pain, no rash, no headache, no new lower extremity erythema or calf tenderness. ? ?In the ED, temperature 97.4 ?F, HR 87, RR 24, BP 1 3476, SPO2 90% on 4 L nasal cannula.  WBC 13.7, hemoglobin 14.5, platelets 359.  Sodium 133, potassium 4.1, chloride 99, CO2 23, glucose 188, BUN 17, creatinine 0.99.  BNP 29.6, high sensitive troponin 5.  COVID-19 PCR negative.  Influenza A/B PCR negative.  Chest x-ray with left pleural effusion with basilar consolidation versus atelectasis, unchanged.  Developing infiltration versus atelectasis right lung base.  CT angiogram chest with slightly prominent main pulmonary arteries, no arterial embolic filling defect, left lower lobe consolidation continues to be  seen, not significant change from 16 days prior but with increased underlying left pleural effusion which is predominantly subpulmonic and may be at least partially loculated with partial fascial extension of the fluid, no enhancing pleural rind around the fluid to suspect empyema and fluid is low in density consistent with transudative rather than exudative effusion, persistence of 1.5 cm fissural nodule or fluid in the lingular base, advanced emphysematous change, aortic/coronary artery atherosclerosis.  Patient was given albuterol neb, cefepime, IV vancomycin.  Hospital service consulted for further evaluation management of acute on chronic respiratory failure secondary to right lower lobe pneumonia complicated by acute COPD exacerbation. ? ?06/05/2021: Patient feels 40% improved.  Patient continues to report shortness of breath weight activities.  Not associated fever or chills.  No productive cough. ? ?Assessment & Plan: ?  ? ?Acute on chronic hypoxic respiratory failure, POA ?Patient presenting to ED with progressive shortness of breath over the last 3-4 days associated with wheezing.  Etiology likely multifactorial with persistent pneumonia, now developing right lower lobe with acute COPD exacerbation.  Patient on 3 L nasal cannula at baseline, also concerned that patient is not receiving his home oxygen as needed. ?--Continue treatment of pneumonia and COPD exacerbation as below ?--Strict titrate supplemental oxygen to maintain SPO2 greater than 88%, 3 L nasal cannula baseline ?--TOC for home O2 needs ?06/06/2021: Continue IV Solu-Medrol, nebs DuoNeb and Pulmicort and antibiotics, incentive spirometry and flutter valve device therapy. ? ?Community-acquired pneumonia ?Patient recently hospitalized and completed 5-day course of antibiotics for commune acquired pneumonia with Augmentin and azithromycin.  Complicated by immunocompromise state due to treatment of ankylosing spondylitis/uveitis with methotrexate and  mycophenolate.  Imaging studies show persistent consolidative process left  lower lobe with new development of a right lower lobe infiltrate.  Patient with elevated WBC count of 13.7.  BNP within normal limits.  MRSA PCR negative, vancomycin discontinued. ?--Blood cultures x2: Pending ?--Cefepime ?--Supplemental oxygen, maintain SPO2 greater than 88% ?--Mucinex 600 mg p.o. twice daily ?--Flutter valve, incentive spirometry ?--CBC daily ?06/06/2021: Continue current antibiotics. ? ? ?Acute COPD exacerbation ?Patient reports progressive wheezing over the last 3-4 days.  Not improved with home nebulizer.  Reports compliance with his home inhalers. ?--Brovana neb twice daily ?--Pulmicort neb twice daily ?--Solu-Medrol 80 mg IV every 12 hours ?--Albuterol/DuoNebs as needed ?--Continue supplemental oxygen as above, baseline 3 L nasal cannula ? ?Essential hypertension ?--Cardizem CD1 180 mg p.o. daily ?--Lisinopril 20 mg p.o. daily ? ?Type 2 diabetes mellitus ?Home regimen includes metformin 500 mg p.o. twice daily. ?--Hold oral hypoglycemics while inpatient ?--SSI for coverage ?--CBGs before every meal/at bedtime ? ?History of pulmonary embolism ?Diagnosed December 2022.  Remains on Eliquis, no missed doses.  CT angiogram chest on admission negative for PE. ?--Continue Eliquis 5 g p.o. twice daily ? ?Depression/anxiety ?--Trileptal ?--Latuda ?--Bupropion ?--BuSpar ? ?BPH ?--Tamsulosin 0.4 mg p.o. daily ? ?OSA ?--Continue nocturnal CPAP ? ?Hx non-small cell lung cancer stage II s/p radiation ?Outpatient follow-up with oncology. ? ?History of ankylosing spondylitis/uveitis ?Chronic, stable.  Continue home methotrexate and mycophenolate. ? ?Physical deconditioning: ?--PT/OT evaluation tomorrow morning ? ?Morbid obesity ?Body mass index is 43.78 kg/m?[name]  Discussed with patient needs for aggressive lifestyle changes/weight loss as this complicates all facets of care.  Outpatient follow-up with PCP.   ? ? ? ?DVT prophylaxis: SCDs  Start: 06/04/21 0139 ?apixaban (ELIQUIS) tablet 5 mg  ?  Code Status: Full Code ?Family Communication:  ? ?Disposition Plan:  ?Level of care: Telemetry ?Status is: Inpatient ?Remains inpatient appropriate because: IV antibiotics ?  ? ?Consultants:  ?None ? ?Procedures:  ?None ? ?Antimicrobials:  ?Vancomycin 4/6 - 4/7 ?Cefepime 4/6>> ? ? ?Subjective: ?Patient seen ?Patient is slowly improving. ? ?Objective: ?Vitals:  ? 06/06/21 0852 06/06/21 1029 06/06/21 1408 06/06/21 1430  ?BP: (!) 154/91 134/61  (!) 161/78  ?Pulse:  (!) 104  (!) 104  ?Resp:  18  16  ?Temp:  98.3 ?F (36.8 ?C)  97.6 ?F (36.4 ?C)  ?TempSrc:  Oral  Axillary  ?SpO2:   95% 97%  ?Weight:      ?Height:      ? ? ?Intake/Output Summary (Last 24 hours) at 06/06/2021 1542 ?Last data filed at 06/06/2021 9417 ?Gross per 24 hour  ?Intake 865.94 ml  ?Output --  ?Net 865.94 ml  ? ? ?Filed Weights  ? 06/03/21 2256 06/06/21 0500  ?Weight: (!) 154.2 kg (!) 154.7 kg  ? ? ?Examination: ? ?Physical Exam: ?GEN: Patient is morbidly obese.  Patient is awake and alert. ?HEENT: Patient is pale.  No jaundice. ?Neck: Supple.  No raised JVD. ?Lungs: Decreased air entry. ?CVS: S1-S2. ?Abdomen: Morbidly obese.  Soft and nontender. ?Neuro: Awake and alert.  Moves all extremities. ?Extremities: Fullness of the ankle.   ? ?Data Reviewed: I have personally reviewed following labs and imaging studies ? ?CBC: ?Recent Labs  ?Lab 06/03/21 ?2335 06/04/21 ?0500 06/05/21 ?4081  ?WBC 13.7* 12.1* 15.8*  ?NEUTROABS  --  11.5*  --   ?HGB 14.5 14.5 13.7  ?HCT 43.4 43.0 40.6  ?MCV 96.0 97.5 95.8  ?PLT 359 371 344  ? ? ?Basic Metabolic Panel: ?Recent Labs  ?Lab 06/03/21 ?2335 06/04/21 ?0115 06/04/21 ?0500 06/05/21 ?4481  ?NA 133*  --  129* 129*  ?K 4.1  --  5.1 4.5  ?CL 99  --  96* 97*  ?CO2 23  --  20* 21*  ?GLUCOSE 188*  --  295* 264*  ?BUN 17  --  18 17  ?CREATININE 0.99  --  1.24 0.87  ?CALCIUM 9.5  --  9.4 8.8*  ?MG  --  1.9 2.0 1.9  ?PHOS  --   --  3.1  --   ? ? ?GFR: ?Estimated Creatinine  Clearance: 126 mL/min (by C-G formula based on SCr of 0.87 mg/dL). ?Liver Function Tests: ?Recent Labs  ?Lab 06/04/21 ?0500  ?AST 25  ?ALT 27  ?ALKPHOS 84  ?BILITOT 0.4  ?PROT 7.7  ?ALBUMIN 4.2  ? ? ?No resu

## 2021-06-06 NOTE — Progress Notes (Signed)
Pt refused nocturnal cpap.  Pt stated that the mask is too uncomfortable on his forehead.  Pt stated that he does not want to wear it while he is here in the hospital.  Pt encouraged to call should he change his mind.  Cpap order changed to prn. ?

## 2021-06-07 DIAGNOSIS — J189 Pneumonia, unspecified organism: Secondary | ICD-10-CM | POA: Diagnosis not present

## 2021-06-07 LAB — RENAL FUNCTION PANEL
Albumin: 3.8 g/dL (ref 3.5–5.0)
Anion gap: 9 (ref 5–15)
BUN: 26 mg/dL — ABNORMAL HIGH (ref 8–23)
CO2: 25 mmol/L (ref 22–32)
Calcium: 8.6 mg/dL — ABNORMAL LOW (ref 8.9–10.3)
Chloride: 96 mmol/L — ABNORMAL LOW (ref 98–111)
Creatinine, Ser: 0.95 mg/dL (ref 0.61–1.24)
GFR, Estimated: >60 mL/min (ref >60)
Glucose, Bld: 302 mg/dL — ABNORMAL HIGH (ref 70–99)
Phosphorus: 3.2 mg/dL (ref 2.5–4.6)
Potassium: 5.3 mmol/L — ABNORMAL HIGH (ref 3.5–5.1)
Sodium: 130 mmol/L — ABNORMAL LOW (ref 135–145)

## 2021-06-07 LAB — CBC WITH DIFFERENTIAL/PLATELET
Abs Immature Granulocytes: 0.12 10*3/uL — ABNORMAL HIGH (ref 0.00–0.07)
Basophils Absolute: 0 10*3/uL (ref 0.0–0.1)
Basophils Relative: 0 %
Eosinophils Absolute: 0 10*3/uL (ref 0.0–0.5)
Eosinophils Relative: 0 %
HCT: 44.5 % (ref 39.0–52.0)
Hemoglobin: 15 g/dL (ref 13.0–17.0)
Immature Granulocytes: 1 %
Lymphocytes Relative: 4 %
Lymphs Abs: 0.6 10*3/uL — ABNORMAL LOW (ref 0.7–4.0)
MCH: 32.8 pg (ref 26.0–34.0)
MCHC: 33.7 g/dL (ref 30.0–36.0)
MCV: 97.4 fL (ref 80.0–100.0)
Monocytes Absolute: 1 10*3/uL (ref 0.1–1.0)
Monocytes Relative: 6 %
Neutro Abs: 14.9 10*3/uL — ABNORMAL HIGH (ref 1.7–7.7)
Neutrophils Relative %: 89 %
Platelets: 391 10*3/uL (ref 150–400)
RBC: 4.57 MIL/uL (ref 4.22–5.81)
RDW: 14.5 % (ref 11.5–15.5)
WBC: 16.7 10*3/uL — ABNORMAL HIGH (ref 4.0–10.5)
nRBC: 0 % (ref 0.0–0.2)

## 2021-06-07 LAB — GLUCOSE, CAPILLARY
Glucose-Capillary: 252 mg/dL — ABNORMAL HIGH (ref 70–99)
Glucose-Capillary: 216 mg/dL — ABNORMAL HIGH (ref 70–99)
Glucose-Capillary: 258 mg/dL — ABNORMAL HIGH (ref 70–99)
Glucose-Capillary: 282 mg/dL — ABNORMAL HIGH (ref 70–99)

## 2021-06-07 LAB — MAGNESIUM: Magnesium: 2.4 mg/dL (ref 1.7–2.4)

## 2021-06-07 LAB — LACTIC ACID, PLASMA
Lactic Acid, Venous: 2.3 mmol/L (ref 0.5–1.9)
Lactic Acid, Venous: 2.7 mmol/L (ref 0.5–1.9)

## 2021-06-07 NOTE — TOC Initial Note (Signed)
Transition of Care (TOC) - Initial/Assessment Note  ? ? ?Patient Details  ?Name: Mario [name] ?MRN: [id number] ?Date of Birth: [birth year] ? ?Transition of Care (TOC) CM/SW Contact:    ?Mario Cooler, RN ?Phone Number: ?06/07/2021, 4:17 PM ? ?Clinical Narrative:                 ? ?Patient has been on home health services with [facility].  Confirmed this with [facility] rep, Mario [name].   ? ?Patient has been claiming that he does not have home O2.  At last discharge from [facility] in March, the [facility] CM confirmed that patient had home O2 prior to his discharge.   ?This CM called [facility] at [phone] and left a message asking for a return call to confirm home O2.  Mario [name] at [phone] ext [phone] and left message regarding patient.   ? ?Spoke with patient at bedside.  He states he does actually have a home O2 concentrator and tanks and has everything he needs. States he is independent in his house, but takes his cane to doctor's appointments, and then uses a wheelchair at the appointment.  States he goes to the [place] [facility] in [place] as his PCP.   Also confirms that he still lives with his roommate, Mario [name], in an apartment with 5 steps.  ? ?Patient will need transportation assistance to get home, probably [employer].  ? ? ? ?Expected Discharge Plan: [place] ?Barriers to Discharge: Continued Medical Work up ? ? ?Patient Goals and CMS Choice ?Patient states their goals for this hospitalization and ongoing recovery are:: return home ?  ?  ? ?Expected Discharge Plan and Services ?Expected Discharge Plan: [place] ?  ?  ?  ?Living arrangements for the past 2 months: Apartment ?Expected Discharge Date:  (unknown)               ?  ?  ?Prior Living Arrangements/Services ?Living arrangements for the past 2 months: Apartment ?Lives with:: Roommate ?Patient language and need for interpreter reviewed:: Yes ?       ?Need for Family Participation  in Patient Care: Yes (Comment) ?Care giver support system in place?: Yes (comment) ?Current home services: DME (Patient has home O2 and concentrator, has home CPAP) ?Criminal Activity/Legal Involvement Pertinent to Current Situation/Hospitalization: No - Comment as needed ? ?Activities of Daily Living ?Home Assistive Devices/Equipment: Cane (specify quad or straight), Oxygen, Other (Comment), Shower chair without back (single point cane, tub/shower unit, standard height toilet, wears O2 with activity and sleep) ?ADL Screening (condition at time of admission) ?Patient's cognitive ability adequate to safely complete daily activities?: Yes ?Is the patient deaf or have difficulty hearing?: No ?Does the patient have difficulty seeing, even when wearing glasses/contacts?: No ?Does the patient have difficulty concentrating, remembering, or making decisions?: No ?Patient able to express need for assistance with ADLs?: Yes ?Does the patient have difficulty dressing or bathing?: No ?Independently performs ADLs?: Yes (appropriate for developmental age) ?Does the patient have difficulty walking or climbing stairs?: Yes (gets short of breath with activity) ?Weakness of Legs: Both (uses a cane) ?Weakness of Arms/Hands: None ? ?Emotional Assessment ?Appearance:: Appears stated age ?Attitude/Demeanor/Rapport: Engaged ?Affect (typically observed): Appropriate ?Orientation: : Oriented to Self, Oriented to Place, Oriented to  Time, Oriented to Situation ?Alcohol / Substance Use: Not Applicable ?Psych Involvement: No (comment) ? ?Admission diagnosis:  Acute respiratory failure with hypoxia ([place]) [J96.01] ?HCAP (healthcare-associated pneumonia) [J18.9] ?  Acute on chronic respiratory failure with hypoxia (HCC) [J96.21] ?Patient Active Problem List  ? Diagnosis Date Noted  ? Acute respiratory failure with hypoxia ([place]) 06/04/2021  ? Acute on chronic respiratory failure with hypoxia ([place]) 06/04/2021  ? Hypertension   ? Allergic rhinitis   ?  History of pulmonary embolism   ? Acute hypoxemic respiratory failure ([place]) 05/21/2021  ? Hyponatremia 05/20/2021  ? Acute pulmonary embolism without acute cor pulmonale ([place]) 02/03/2021  ? Mood disorder ([place]) 02/03/2021  ? Acute on chronic respiratory failure ([place]) 01/09/2021  ? Non-small cell cancer of left lung ([place]) 09/21/2020  ? Lung nodule 07/30/2020  ? Mass of left lung   ? Acute respiratory failure with hypoxemia ([place]) 07/26/2020  ? Constipation   ? HCAP (healthcare-associated pneumonia) 04/03/2020  ? COPD with acute exacerbation ([place]) 01/05/2020  ? Vertigo 12/26/2019  ? 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 Stage 3a chronic kidney disease ([place]) 12/26/2019  ? Abnormal CT of the chest 12/26/2019  ? COPD (chronic obstructive pulmonary disease) ([place])   ? AKI (acute kidney injury) ([place])   ? Hyperglycemia 06/23/2019  ? OSA (obstructive sleep apnea) 06/23/2019  ? Suicidal ideation 06/23/2019  ? Major depressive disorder, recurrent episode ([place]) 10/12/2018  ? 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 Cellulitis of both feet 07/16/2017  ? DM2 (diabetes mellitus, type 2) ([place]) 07/16/2017  ? HLA B27 (HLA B27 positive) 07/16/2017  ? Acute chest pain   ? Hyperkalemia   ? Spontaneous pneumothorax 06/27/2016  ? Hypoxia   ? Pneumothorax on left   ? Dyspnea 05/23/2014  ? COPD exacerbation ([place]) 05/23/2014  ? Malingering 01/21/2013  ? Depression 01/11/2013  ? Tobacco abuse 01/11/2013  ? Obesity, unspecified 01/11/2013  ? Chest pain 01/10/2013  ? Hypertension associated with diabetes ([place]) 01/10/2013  ? ?PCP:  [facility] ?Pharmacy:   ?[place], [address] ?[phone] ?[place] [address] ?Phone: [phone] Fax: [phone] ? ? ? ?Readmission Risk Interventions ? ?  06/07/2021  ?  4:15 PM  ?Readmission Risk Prevention Plan  ?Transportation Screening Complete  ?Medication Review Human resources officer) Complete  ?PCP or Specialist appointment within 3-5 days of discharge Complete  ?[place] or Home Care Consult Complete  ?SW Recovery Care/Counseling Consult Complete  ?Palliative Care Screening Not Applicable  ?[place] Not Applicable  ? ? ? ?

## 2021-06-07 NOTE — Progress Notes (Signed)
Mobility Specialist - Progress Note ? ? 06/07/21 1244  ?Oxygen Therapy  ?SpO2 91 %  ?O2 Device Nasal Cannula  ?O2 Flow Rate (L/min) 4 L/min  ?Mobility  ?Activity Ambulated with assistance in hallway  ?Level of Assistance Modified independent, requires aide device or extra time  ?Assistive Device [place]  ?Distance Ambulated (ft) 65 ft  ?Activity Response Tolerated well  ?$Mobility charge 1 Mobility  ? ?Pre-mobility: 99 HR, 95% SpO2 ?During mobility: 126 HR, 91% SpO2 ?Post-mobility: 103 HR, 94% SPO2 ? ?Pt in bathroom upon arrival, needed encouragement to ambulate in hallway. Pt used cane to ambulate ~65 ft in hallway. Reported lower back pain during session and requested to return to room. Pt refused to sit in recliner but willing to sit EOB to eat lunch. Left will lunch on tray and call bell at side.  ? ?Mario Ellinwood S. ?Mobility Specialist ?Acute Rehabilitation Services ?Phone: [phone] ?06/07/21, 12:48 PM ? ? ?

## 2021-06-07 NOTE — Progress Notes (Signed)
Pharmacy Antibiotic Note ? ?Mario [name] is a 71 y.o. male admitted on 06/03/2021 with shortness of breath.  Pharmacy has been consulted to dose cefepime for pna ? ?Day #4 Cefepime ?- Afebrile ?- Last WBC 1.58 on 4.8 (on steroids) ?- SCr iproved 0.87 on 4/8 ?- PCT < 0.1 on 4/7 ?- Neg blood cultures to date ? ?Plan: ?Continue Cefepime 2gm IV q8h ?SCr improved to WNL 4/8, no dose adjustments needed so Pharmacy will sign off; consider limiting abx tx to 5d if clinical improvement ? ?Height: 6\' 2"  (188 cm) ?Weight: (!) 150.5 kg (331 lb 12.8 oz) ?IBW/kg (Calculated) : 82.2 ? ?Temp (24hrs), Avg:98 ?F (36.7 ?C), Min:97.6 ?F (36.4 ?C), Max:98.3 ?F (36.8 ?C) ? ?Recent Labs  ?Lab 06/03/21 ?2335 06/04/21 ?0500 06/04/21 ?0749 06/04/21 ?1200 06/04/21 ?1335 06/05/21 ?3335  ?WBC 13.7* 12.1*  --   --   --  15.8*  ?CREATININE 0.99 1.24  --   --   --  0.87  ?LATICACIDVEN  --   --  4.4* 4.0* 3.4* 2.8*  ? ?  ?Estimated Creatinine Clearance: 124.1 mL/min (by C-G formula based on SCr of 0.87 mg/dL).   ? ?Allergies  ?Allergen Reactions  ? Demerol [Meperidine] Nausea And Vomiting and Other (See Comments)  ?  Made the patient "violently sick"  ? Zocor [Simvastatin] Nausea And Vomiting and Other (See Comments)  ?  Made him very jittery, also  ? ? ?Antimicrobials this admission: ?4/7 vanc x 1 dose ?4/7 cefepime>>  ? ?Dose adjustments: none ? ?Microbiology results: ?4/7 MRSA neg ?4/7 BCx2 ngtd ? ?Thank you for allowing pharmacy to be a part of this patient?s care. ? ?Peggyann Juba, PharmD, BCPS ?Pharmacy: [phone] ?06/07/2021, 7:34 AM ? ? ?

## 2021-06-07 NOTE — Progress Notes (Signed)
?PROGRESS NOTE ? ? ? ?CORDERRO KOLOSKI  VEL:[id number] DOB: [birth year] DOA: 06/03/2021 ?PCP: [facility]  ? ? ?Brief Narrative:  ? 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 ? ?Patient recently discharged from [facility] in which she was hospitalized 05/21/2021 through 05/24/2021 on the internal medicine teaching service for acute COPD exacerbation in the setting of community-acquired pneumonia.  Patient was treated with breathing treatments, steroids and antibiotics x5 days.  Following return home, continued with progressive shortness of breath.  Patient is prescribed continuous oxygen, but patient reports that he has not received an oxygen concentrator from the [place] to support this as of yet.  Patient also reports productive cough and subjective fever.  Denies chest pain, no diaphoresis, no nausea/vomiting/diarrhea, no chills/night sweats, no abdominal pain, no rash, no headache, no new lower extremity erythema or calf tenderness. ? ?In the ED, temperature 97.4 ?F, HR 87, RR 24, BP 1 3476, SPO2 90% on 4 L nasal cannula.  WBC 13.7, hemoglobin 14.5, platelets 359.  Sodium 133, potassium 4.1, chloride 99, CO2 23, glucose 188, BUN 17, creatinine 0.99.  BNP 29.6, high sensitive troponin 5.  COVID-19 PCR negative.  Influenza A/B PCR negative.  Chest x-ray with left pleural effusion with basilar consolidation versus atelectasis, unchanged.  Developing infiltration versus atelectasis right lung base.  CT angiogram chest with slightly prominent main pulmonary arteries, no arterial embolic filling defect, left lower lobe consolidation continues to be  seen, not significant change from 16 days prior but with increased underlying left pleural effusion which is predominantly subpulmonic and may be at least partially loculated with partial fascial extension of the fluid, no enhancing pleural rind around the fluid to suspect empyema and fluid is low in density consistent with transudative rather than exudative effusion, persistence of 1.5 cm fissural nodule or fluid in the lingular base, advanced emphysematous change, aortic/coronary artery atherosclerosis.  Patient was given albuterol neb, cefepime, IV vancomycin.  Hospital service consulted for further evaluation management of acute on chronic respiratory failure secondary to right lower lobe pneumonia complicated by acute COPD exacerbation. ? ?06/05/2021: Patient feels 40% improved.  Patient continues to report shortness of breath weight activities.  Not associated fever or chills.  No productive cough. ? ?06/07/2021: Patient seen.  Patient continues to deny any improvement.  Patient continues to report dyspnea on minimal exertion.  We will consult physical therapy team.  Patient is not particularly keen on being discharged back on.   ? ?Assessment & Plan: ?  ? ?Acute on chronic hypoxic respiratory failure, POA ?Patient presenting to ED with progressive shortness of breath over the last 3-4 days associated with wheezing.  Etiology likely multifactorial with persistent pneumonia, now developing right lower lobe with acute COPD exacerbation.  Patient on 3 L nasal cannula at baseline, also concerned that patient is not receiving his home oxygen as needed. ?--Continue treatment of pneumonia and COPD exacerbation as below ?--Strict titrate supplemental oxygen to maintain SPO2 greater than 88%, 3 L nasal cannula baseline ?--TOC for home O2 needs ?06/07/2021: Continue IV Solu-Medrol, nebs DuoNeb and Pulmicort and antibiotics, incentive spirometry and flutter valve device therapy. ? ?Community-acquired pneumonia ?Patient  recently hospitalized and completed 5-day course of antibiotics for commune acquired pneumonia with Augmentin and azithromycin.  Complicated by immunocompromise state due to treatment of ankylosing spondylitis/uveitis with methotrexate and mycophenolate.  Imaging studies show persistent consolidative process left lower lobe with new development of a right lower lobe infiltrate.  Patient with elevated WBC count of 13.7.  BNP within normal limits.  MRSA PCR negative, vancomycin discontinued. ?--Blood cultures x2: Pending ?--Cefepime ?--Supplemental oxygen, maintain SPO2 greater than 88% ?--Mucinex 600 mg p.o. twice daily ?--Flutter valve, incentive spirometry ?--CBC daily ?06/07/2021: Complete course of antibiotics.   ? ? ?Acute COPD exacerbation ?Patient reports progressive wheezing over the last 3-4 days.  Not improved with home nebulizer.  Reports compliance with his home inhalers. ?--Brovana neb twice daily ?--Pulmicort neb twice daily ?--Solu-Medrol 80 mg IV every 12 hours ?--Albuterol/DuoNebs as needed ?--Continue supplemental oxygen as above, baseline 3 L nasal cannula ?06/07/2021: Patient continues to deny any improvement.  Suspect patient is not particularly keen on being discharged back on. ? ?Essential hypertension ?--Cardizem CD1 180 mg p.o. daily ?--Lisinopril 20 mg p.o. daily ? ?Type 2 diabetes mellitus ?Home regimen includes metformin 500 mg p.o. twice daily. ?--Hold oral hypoglycemics while inpatient ?--SSI for coverage ?--CBGs before every meal/at bedtime ? ?History of pulmonary embolism ?Diagnosed December 2022.  Remains on Eliquis, no missed doses.  CT angiogram chest on admission negative for PE. ?--Continue Eliquis 5 g p.o. twice daily ? ?Depression/anxiety ?--Trileptal ?--Latuda ?--Bupropion ?--BuSpar ? ?BPH ?--Tamsulosin 0.4 mg p.o. daily ? ?OSA ?--Continue nocturnal CPAP ? ?Hx non-small cell lung cancer stage II s/p radiation ?Outpatient follow-up with oncology. ? ?History of ankylosing  spondylitis/uveitis ?Chronic, stable.  Continue home methotrexate and mycophenolate. ? ?Physical deconditioning: ?--PT/OT evaluation tomorrow morning ? ?Morbid obesity ?Body mass index is 42.6 kg/m?[name]  Discussed with patient needs for aggressive lifestyle changes/weight loss as this complicates all facets of care.  Outpatient follow-up with PCP.   ? ? ? ?DVT prophylaxis: SCDs Start: 06/04/21 0139 ?apixaban (ELIQUIS) tablet 5 mg  ?  Code Status: Full Code ?Family Communication:  ? ?Disposition Plan:  ?Level of care: Telemetry ?Status is: Inpatient ?Remains inpatient appropriate because: IV antibiotics ?  ? ?Consultants:  ?None ? ?Procedures:  ?None ? ?Antimicrobials:  ?Vancomycin 4/6 - 4/7 ?Cefepime 4/6>> ? ? ?Subjective: ?Patient seen ?Patient continues to deny any improvement.   ? ?Objective: ?Vitals:  ? 06/07/21 0836 06/07/21 0940 06/07/21 1244 06/07/21 1335  ?BP:  (!) 152/84  (!) 141/84  ?Pulse:    (!) 107  ?Resp:    20  ?Temp:    98.2 ?F (36.8 ?C)  ?TempSrc:    Oral  ?SpO2: 95%  91% 96%  ?Weight:      ?Height:      ? ? ?Intake/Output Summary (Last 24 hours) at 06/07/2021 1735 ?Last data filed at 06/07/2021 0539 ?Gross per 24 hour  ?Intake 540.07 ml  ?Output --  ?Net 540.07 ml  ? ? ?Filed Weights  ? 06/03/21 2256 06/06/21 0500 06/07/21 0500  ?Weight: (!) 154.2 kg (!) 154.7 kg (!) 150.5 kg  ? ? ?Examination: ? ?Physical Exam: ?GEN: Patient is morbidly obese.  Patient is awake and alert. ?HEENT: Patient is pale.  No jaundice. ?Neck: Supple.  No raised JVD. ?Lungs: Decreased air entry. ?CVS: S1-S2. ?Abdomen: Morbidly obese.  Soft and nontender. ?Neuro: Awake and alert.  Moves all extremities. ?Extremities: Fullness of the ankle.   ? ?Data Reviewed: I have personally reviewed following labs and imaging studies ? ?  CBC: ?Recent Labs  ?Lab 06/03/21 ?2335 06/04/21 ?0500 06/05/21 ?5686 06/07/21 ?1031  ?WBC 13.7* 12.1* 15.8* 16.7*  ?NEUTROABS  --  11.5*  --  14.9*  ?HGB 14.5 14.5 13.7 15.0  ?HCT 43.4 43.0 40.6 44.5  ?MCV 96.0  97.5 95.8 97.4  ?PLT 359 371 344 391  ? ? ?Basic Metabolic Panel: ?Recent Labs  ?Lab 06/03/21 ?2335 06/04/21 ?0115 06/04/21 ?0500 06/05/21 ?1683 06/07/21 ?1031  ?NA 133*  --  129* 129* 130*  ?K 4.1  --  5.1 4.5 5.

## 2021-06-07 NOTE — Evaluation (Signed)
Physical Therapy Evaluation ?Patient Details ?Name: Mario [name] ?MRN: [id number] ?DOB: 1950/07/06 ?Today's Date: 06/07/2021 ? ?History of Present Illness ? Pt is 71 yo male admitted on 06/03/21 with acute on chronic resp failure - COPD vs CAP and hx of [place] lung CA stage II s/p radiation. Pt's 3rd admission for similar dx in 1 month.  Pt with hx including PE in 2023, OSA on CPAP, HTN, chronic back pain, hx of ankylosing spondylitis, anxiety, and depression, T2DM.  ?Clinical Impression ?  ?Pt admitted with above diagnosis. At baseline, pt limited community ambulator due to back pain and resp status.  He has been admitted 3 x in last month for resp failure.  Today, pt ambulating 42' with cane and min guard.  He required 3 L O2 with sats dropping to 88%. Pt does present with mobility deficits, low endurance, and does have 5 steps to enter home.  Will benefit from PT to advance and maintain mobility while hospitalized. Pt currently with functional limitations due to the deficits listed below (see PT Problem List). Pt will benefit from skilled PT to increase their independence and safety with mobility to allow discharge to the venue listed below.   ?   ?   ? ?Recommendations for follow up therapy are one component of a multi-disciplinary discharge planning process, led by the attending physician.  Recommendations may be updated based on patient status, additional functional criteria and insurance authorization. ? ?Follow Up Recommendations Home health PT ? ?  ?Assistance Recommended at Discharge PRN  ?Patient can return home with the following ? Assist for transportation ? ?  ?Equipment Recommendations None recommended by PT  ?Recommendations for Other Services ?    ?  ?Functional Status Assessment Patient has had a recent decline in their functional status and demonstrates the ability to make significant improvements in function in a reasonable and predictable amount of time.  ? ?  ?Precautions / Restrictions  Precautions ?Precautions: Fall  ? ?  ? ?Mobility ? Bed Mobility ?  ?  ?  ?  ?  ?  ?  ?General bed mobility comments: pt sitting upright at EOB upon PT arrival ?  ? ?Transfers ?Overall transfer level: Needs assistance ?Equipment used: None ?Transfers: Sit to/from Stand ?Sit to Stand: Supervision ?  ?  ?  ?  ?  ?  ?  ? ?Ambulation/Gait ?Ambulation/Gait assistance: Min guard ?Gait Distance (Feet): 60 Feet ?Assistive device: Straight cane ?Gait Pattern/deviations: Decreased stride length ?Gait velocity: decreased ?  ?  ?General Gait Details: Slow but steady gait; min guard for safety; cues for posture; fatigued easily ? ?Stairs ?  ?  ?  ?  ?  ? ?Wheelchair Mobility ?  ? ?Modified Rankin (Stroke Patients Only) ?  ? ?  ? ?Balance Overall balance assessment: Needs assistance ?Sitting-balance support: No upper extremity supported ?Sitting balance-Leahy Scale: Good ?  ?  ?Standing balance support: During functional activity, No upper extremity supported ?Standing balance-Leahy Scale: Fair ?Standing balance comment: cane to ambulate but could static stand no AD ?  ?  ?  ?  ?  ?  ?  ?  ?  ?  ?  ?   ? ? ? ?Pertinent Vitals/Pain Pain Assessment ?Pain Assessment: Faces ?Faces Pain Scale: Hurts a little bit ?Pain Location: chronic back pain ?Pain Descriptors / Indicators: Discomfort ?Pain Intervention(s): Limited activity within patient's tolerance, Monitored during session  ? ? ?Home Living Family/patient expects to be discharged to:: Private residence ?Living Arrangements:  Non-relatives/Friends (roommate) ?Available Help at Discharge: Friend(s);Available PRN/intermittently ?Type of Home: Apartment ?Home Access: Stairs to enter ?Entrance Stairs-Rails: Right ?Entrance Stairs-Number of Steps: 5 ?  ?Home Layout: One level ?Home Equipment: Kasandra Knudsen - single point;Shower seat ?Additional Comments: wears 3 L O2 with activity and sleep  ?  ?Prior Function Prior Level of Function : Independent/Modified Independent ?  ?  ?  ?  ?  ?  ?Mobility  Comments: ambulates within his home without an AD; typically utilizes a w/c when at dr's appointments; otherwise does not leave his home ?ADLs Comments: independent with ADLs and IADLs; reports sits to shower, easily fatigues ?  ? ? ?Hand Dominance  ? Dominant Hand: Right ? ?  ?Extremity/Trunk Assessment  ? Upper Extremity Assessment ?Upper Extremity Assessment: Overall WFL for tasks assessed ?  ? ?Lower Extremity Assessment ?Lower Extremity Assessment: Overall WFL for tasks assessed ?  ? ?Cervical / Trunk Assessment ?Cervical / Trunk Assessment: Other exceptions ?Cervical / Trunk Exceptions: hx of back pain  ?Communication  ? Communication: No difficulties  ?Cognition Arousal/Alertness: Awake/alert ?Behavior During Therapy: [facility] for tasks assessed/performed ?Overall Cognitive Status: Within Functional Limits for tasks assessed ?  ?  ?  ?  ?  ?  ?  ?  ?  ?  ?  ?  ?  ?  ?  ?  ?  ?  ?  ? ?  ?General Comments General comments (skin integrity, edema, etc.): Pt on 4 L O2 in room (reports bumped to 4 b/c tubing so long).  Ambualted on 3 L with sats 88%. At rest 93%. Uses 3 L at home. ? ?  ?Exercises    ? ?Assessment/Plan  ?  ?PT Assessment Patient needs continued PT services  ?PT Problem List Decreased strength;Decreased activity tolerance;Decreased mobility;Cardiopulmonary status limiting activity;Decreased balance;Decreased knowledge of use of DME ? ?   ?  ?PT Treatment Interventions Gait training;DME instruction;Stair training;Functional mobility training;Therapeutic activities;Therapeutic exercise;Balance training;Neuromuscular re-education;Patient/family education   ? ?PT Goals (Current goals can be found in the Care Plan section)  ?Acute Rehab PT Goals ?Patient Stated Goal: for his breathing to improve ?PT Goal Formulation: With patient ?Time For Goal Achievement: 06/21/21 ?Potential to Achieve Goals: Good ? ?  ?Frequency Min 3X/week ?  ? ? ?Co-evaluation   ?  ?  ?  ?  ? ? ?  ?AM-PAC PT "6 Clicks" Mobility  ?Outcome  Measure Help needed turning from your back to your side while in a flat bed without using bedrails?: None ?Help needed moving from lying on your back to sitting on the side of a flat bed without using bedrails?: None ?Help needed moving to and from a bed to a chair (including a wheelchair)?: A Little ?Help needed standing up from a chair using your arms (e.g., wheelchair or bedside chair)?: A Little ?Help needed to walk in hospital room?: A Little ?Help needed climbing 3-5 steps with a railing? : A Little ?6 Click Score: 20 ? ?  ?End of Session Equipment Utilized During Treatment: Oxygen ?Activity Tolerance: Patient tolerated treatment well ?Patient left: in bed;with call bell/phone within reach;with bed alarm set ?Nurse Communication: Mobility status ?PT Visit Diagnosis: Other abnormalities of gait and mobility (R26.89) ?  ? ?Time: 3474-2595 ?PT Time Calculation (min) (ACUTE ONLY): 12 min ? ? ?Charges:   PT Evaluation ?$PT Eval Low Complexity: 1 Low ?  ?  ?   ? ? ?Abran Richard, PT ?Acute Rehab Services ?Pager [phone] ?[facility] [phone] ? ? ?  Mario [name] ?06/07/2021, 5:37 PM ? ?

## 2021-06-07 NOTE — Plan of Care (Signed)
?  Problem: Education: ?Goal: Knowledge of General Education information will improve ?Description: Including pain rating scale, medication(s)/side effects and non-pharmacologic comfort measures ?Outcome: Progressing ?  ?Problem: Clinical Measurements: ?Goal: Will remain free from infection ?Outcome: Progressing ?Goal: Respiratory complications will improve ?Outcome: Progressing ?  ?Problem: Coping: ?Goal: Level of anxiety will decrease ?Outcome: Progressing ?  ?Problem: Safety: ?Goal: Ability to remain free from injury will improve ?Outcome: Progressing ?  ?

## 2021-06-08 LAB — CBC
HCT: 46.5 % (ref 39.0–52.0)
Hemoglobin: 15.5 g/dL (ref 13.0–17.0)
MCH: 32.4 pg (ref 26.0–34.0)
MCHC: 33.3 g/dL (ref 30.0–36.0)
MCV: 97.3 fL (ref 80.0–100.0)
Platelets: 432 10*3/uL — ABNORMAL HIGH (ref 150–400)
RBC: 4.78 MIL/uL (ref 4.22–5.81)
RDW: 14.4 % (ref 11.5–15.5)
WBC: 15.5 10*3/uL — ABNORMAL HIGH (ref 4.0–10.5)
nRBC: 0 % (ref 0.0–0.2)

## 2021-06-08 LAB — GLUCOSE, CAPILLARY
Glucose-Capillary: 272 mg/dL — ABNORMAL HIGH (ref 70–99)
Glucose-Capillary: 224 mg/dL — ABNORMAL HIGH (ref 70–99)
Glucose-Capillary: 230 mg/dL — ABNORMAL HIGH (ref 70–99)
Glucose-Capillary: 312 mg/dL — ABNORMAL HIGH (ref 70–99)

## 2021-06-08 LAB — RENAL FUNCTION PANEL
Albumin: 3.9 g/dL (ref 3.5–5.0)
Anion gap: 10 (ref 5–15)
BUN: 26 mg/dL — ABNORMAL HIGH (ref 8–23)
CO2: 27 mmol/L (ref 22–32)
Calcium: 8.9 mg/dL (ref 8.9–10.3)
Chloride: 95 mmol/L — ABNORMAL LOW (ref 98–111)
Creatinine, Ser: 0.84 mg/dL (ref 0.61–1.24)
GFR, Estimated: >60 mL/min (ref >60)
Glucose, Bld: 331 mg/dL — ABNORMAL HIGH (ref 70–99)
Phosphorus: 3.4 mg/dL (ref 2.5–4.6)
Potassium: 5 mmol/L (ref 3.5–5.1)
Sodium: 132 mmol/L — ABNORMAL LOW (ref 135–145)

## 2021-06-08 MED ORDER — CYCLOBENZAPRINE HCL 10 MG PO TABS: 10.0000 mg | ORAL_TABLET | Freq: Once | ORAL | Status: DC | PRN

## 2021-06-08 MED ORDER — ONDANSETRON HCL 4 MG/2ML IJ SOLN
4.0000 mg | Freq: Four times a day (QID) | INTRAMUSCULAR | Status: DC | PRN
  Administered 2021-06-08: 4 mg via INTRAVENOUS
  Filled 2021-06-08: qty 2

## 2021-06-08 MED ORDER — POLYVINYL ALCOHOL 1.4 % OP SOLN
1.0000 [drp] | OPHTHALMIC | Status: DC | PRN
  Administered 2021-06-08: 1 [drp] via OPHTHALMIC
  Filled 2021-06-08: qty 15

## 2021-06-08 MED ORDER — OXYCODONE HCL 5 MG PO TABS
10.0000 mg | ORAL_TABLET | Freq: Two times a day (BID) | ORAL | Status: DC | PRN
  Administered 2021-06-08 – 2021-06-11 (×3): 10 mg via ORAL
  Filled 2021-06-08 (×3): qty 2

## 2021-06-08 NOTE — Progress Notes (Signed)
SATURATION QUALIFICATIONS: (This note is used to comply with regulatory documentation for home oxygen) ? ?Patient Saturations on Room Air at Rest = n/a ? ?Patient Saturations on Room Air while Ambulating = n/a% ? ?Patient Saturations on 3 Liters of oxygen while Ambulating = 89% ? ?Please briefly explain why patient needs home oxygen: patient drops with exertion even on oxygen; sats 94% on 3 liters at rest  ?

## 2021-06-08 NOTE — TOC Progression Note (Signed)
Transition of Care (TOC) - Progression Note  ? ? ?Patient Details  ?Name: Mario [name] ?MRN: [id number] ?Date of Birth: 1950-05-11 ? ?Transition of Care (TOC) CM/SW Contact  ?Purcell Mouton, RN ?Phone Number: ?06/08/2021, 11:07 AM ? ?Clinical Narrative:    ?Pt will need PTAR for transportation home when stable for discharge and has O2 at home from [employer] thru the [place]. Oxygen was delivered March 27th to pt's home.  ? ? ?Expected Discharge Plan: [place] ?Barriers to Discharge: Continued Medical Work up ? ?Expected Discharge Plan and Services ?Expected Discharge Plan: [place] ?  ?  ?  ?Living arrangements for the past 2 months: Apartment ?Expected Discharge Date:  (unknown)               ?  ?  ?  ?  ?  ?  ?  ?  ?  ?  ? ? ?Social Determinants of Health (SDOH) Interventions ?  ? ?Readmission Risk Interventions ? ?  06/07/2021  ?  4:15 PM  ?Readmission Risk Prevention Plan  ?Transportation Screening Complete  ?Medication Review Press photographer) Complete  ?PCP or Specialist appointment within 3-5 days of discharge Complete  ?[place] or Home Care Consult Complete  ?SW Recovery Care/Counseling Consult Complete  ?Palliative Care Screening Not Applicable  ?[place] Not Applicable  ? ? ?

## 2021-06-08 NOTE — Progress Notes (Signed)
Inpatient Diabetes Program Recommendations ? ?AACE/ADA: New Consensus Statement on Inpatient Glycemic Control (2015) ? ?Target Ranges:  Prepandial:   less than 140 mg/dL ?     Peak postprandial:   less than 180 mg/dL (1-2 hours) ?     Critically ill patients:  140 - 180 mg/dL  ? ?Lab Results  ?Component Value Date  ? GLUCAP 224 (H) 06/08/2021  ? HGBA1C 7.6 (H) 05/22/2021  ? ? ?Review of Glycemic Control ? ?Diabetes history: DM2 ?Outpatient Diabetes medications: metformin 500 mg BID,  ?Current orders for Inpatient glycemic control: Semglee 10 units BID, Novolog 0-15 units TID with meals ? ?HgbA1C - 7.6% ? ?Inpatient Diabetes Program Recommendations:   ? ?Consider adding Novolog 3 units TID with meals if eating > 50%, while on steroids. ? ?Follow. ? ?Thank you. ?Lorenda Peck, RD, LDN, CDE ?Inpatient Diabetes Coordinator ?[phone]  ? ? ? ? ?

## 2021-06-08 NOTE — Progress Notes (Signed)
?PROGRESS NOTE ? ? ? ?Mario [name]  LYY:[id number] DOB: 1950-11-09 DOA: 06/03/2021 ?PCP: [facility]  ? ? ?Brief Narrative:  ? 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 ? ?Patient recently discharged from [facility] in which she was hospitalized 05/21/2021 through 05/24/2021 on the internal medicine teaching service for acute COPD exacerbation in the setting of community-acquired pneumonia.  Patient was treated with breathing treatments, steroids and antibiotics x5 days.  Following return home, continued with progressive shortness of breath.  Patient is prescribed continuous oxygen, but patient reports that he has not received an oxygen concentrator from the [place] to support this as of yet.  Patient also reports productive cough and subjective fever.  Denies chest pain, no diaphoresis, no nausea/vomiting/diarrhea, no chills/night sweats, no abdominal pain, no rash, no headache, no new lower extremity erythema or calf tenderness. ? ?In the ED, temperature 97.4 ?F, HR 87, RR 24, BP 1 3476, SPO2 90% on 4 L nasal cannula.  WBC 13.7, hemoglobin 14.5, platelets 359.  Sodium 133, potassium 4.1, chloride 99, CO2 23, glucose 188, BUN 17, creatinine 0.99.  BNP 29.6, high sensitive troponin 5.  COVID-19 PCR negative.  Influenza A/B PCR negative.  Chest x-ray with left pleural effusion with basilar consolidation versus atelectasis, unchanged.  Developing infiltration versus atelectasis right lung base.  CT angiogram chest with slightly prominent main pulmonary arteries, no arterial embolic filling defect, left lower lobe consolidation continues to be  seen, not significant change from 16 days prior but with increased underlying left pleural effusion which is predominantly subpulmonic and may be at least partially loculated with partial fascial extension of the fluid, no enhancing pleural rind around the fluid to suspect empyema and fluid is low in density consistent with transudative rather than exudative effusion, persistence of 1.5 cm fissural nodule or fluid in the lingular base, advanced emphysematous change, aortic/coronary artery atherosclerosis.  Patient was given albuterol neb, cefepime, IV vancomycin.  Hospital service consulted for further evaluation management of acute on chronic respiratory failure secondary to right lower lobe pneumonia complicated by acute COPD exacerbation. ? ?06/05/2021: Patient feels 40% improved.  Patient continues to report shortness of breath weight activities.  Not associated fever or chills.  No productive cough. ? ?06/07/2021: Patient seen.  Patient continues to deny any improvement.  Patient continues to report dyspnea on minimal exertion.  We will consult physical therapy team.  Patient is not particularly keen on being discharged back on.   ? ?06/08/21: Ambulatory SPO2 on 3L demonstrating hypoxia as expected, 89%. +Fatigue, SOB. Also requesting Flexeril 30 qhs, oxycodone (not onhome meds) for back pain.  ? ? ?Assessment & Plan: ? ?Acute on chronic hypoxic respiratory failure, POA ?Patient presenting to ED with progressive shortness of breath over the last 3-4 days associated with wheezing.  Etiology likely multifactorial with persistent pneumonia, now developing right lower lobe with acute COPD exacerbation.  Patient on 3 L nasal cannula at baseline, also concerned that patient is not receiving his home oxygen as needed. ?-- Continue treatment of pneumonia and COPD exacerbation as below ?-- Strict titrate supplemental oxygen to maintain SPO2 greater than 88%, 3 L nasal cannula  baseline ?-- TOC for home O2 needs ?06/07/2021:  Continue IV Solu-Medrol, nebs DuoNeb and Pulmicort and antibiotics, incentive spirometry and flutter valve device therapy. ?06/08/21: Continue above therapy, will likely need SNF placement given deconditioning. Follow PT eval ? ?Community-acquired pneumonia ?Patient recently hospitalized and completed 5-day course of antibiotics for commune acquired pneumonia with Augmentin and azithromycin.  Complicated by immunocompromise state due to treatment of ankylosing spondylitis/uveitis with methotrexate and mycophenolate.  Imaging studies show persistent consolidative process left lower lobe with new development of a right lower lobe infiltrate.  Patient with elevated WBC count of 13.7.  BNP within normal limits.  MRSA PCR negative, vancomycin discontinued. ?--Blood cultures x2: Pending ?--Cefepime ?--Supplemental oxygen, maintain SPO2 greater than 88% ?--Mucinex 600 mg p.o. twice daily ?--Flutter valve, incentive spirometry ?--CBC daily ?06/07/2021: Complete course of antibiotics.   ? ? ?Acute COPD exacerbation ?Patient reports progressive wheezing over the last 3-4 days.  Not improved with home nebulizer.  Reports compliance with his home inhalers. ?--Brovana neb twice daily ?--Pulmicort neb twice daily ?--Solu-Medrol 80 mg IV every 12 hours ?--Albuterol/DuoNebs as needed ?--Continue supplemental oxygen as above, baseline 3 L nasal cannula ?06/07/2021: Patient continues to deny any improvement.  Suspect patient is not particularly keen on being discharged back on. ? ?Essential hypertension ?--Cardizem CD1 180 mg p.o. daily ?--Lisinopril 20 mg p.o. daily ? ?Type 2 diabetes mellitus ?Home regimen includes metformin 500 mg p.o. twice daily. ?--Hold oral hypoglycemics while inpatient ?--SSI for coverage ?--CBGs before every meal/at bedtime ? ?History of pulmonary embolism ?Diagnosed December 2022.  Remains on Eliquis, no missed doses.  CT angiogram chest on admission negative for PE. ?--Continue Eliquis 5 g p.o. twice  daily ? ?Depression/anxiety ?--Trileptal ?--Latuda ?--Bupropion ?--BuSpar ? ?BPH ?--Tamsulosin 0.4 mg p.o. daily ? ?OSA ?--Continue nocturnal CPAP ? ?Hx non-small cell lung cancer stage II s/p radiation ?Outpatient follow-up with oncology. ? ?History of ankylosing spondylitis/uveitis ?Chronic, stable.  Continue home methotrexate and mycophenolate. ? ?Physical deconditioning: ?--PT/OT evaluation tomorrow morning ? ?Morbid obesity ?Body mass index is 42.77 kg/m?[name]  Discussed with patient needs for aggressive lifestyle changes/weight loss as this complicates all facets of care.  Outpatient follow-up with PCP.   ? ? ? ?DVT prophylaxis: SCDs Start: 06/04/21 0139 ?apixaban (ELIQUIS) tablet 5 mg  ?  Code Status: Full Code ?Family Communication:  ? ?Disposition Plan:  ?Level of care: Telemetry ?Status is: Inpatient ?Remains inpatient appropriate because: IV antibiotics ?  ? ?Consultants:  ?None ? ?Procedures:  ?None ? ?Antimicrobials:  ?Vancomycin 4/6 - 4/7 ?Cefepime 4/6>> ? ? ?Subjective: ?Patient seen ?Patient continues to deny any improvement.   ? ?Objective: ?Vitals:  ? 06/08/21 0500 06/08/21 0515 06/08/21 0831 06/08/21 1414  ?BP:  (!) 144/92  138/73  ?Pulse:  94  (!) 101  ?Resp:  20  19  ?Temp:  97.8 ?F (36.6 ?C)  97.6 ?F (36.4 ?C)  ?TempSrc:  Oral  Oral  ?SpO2:  98% 98% 96%  ?Weight: (!) 151.1 kg     ?Height:      ? ? ?Intake/Output Summary (Last 24 hours) at 06/08/2021 1646 ?Last data filed at 06/08/2021 0800 ?Gross per 24 hour  ?Intake 780 ml  ?Output --  ?Net 780 ml  ? ? ?Filed Weights  ? 06/06/21 0500 06/07/21 0500 06/08/21 0500  ?Weight: (!) 154.7 kg (!) 150.5 kg (!) 151.1 kg  ? ? ?Examination: ? ?Physical Exam: ?GEN: Patient is morbidly obese.  Patient is awake and alert. ?HEENT: Patient is pale.  No jaundice. ?  Neck: Supple.  No raised JVD. ?Lungs: Decreased air entry. ?CVS: S1-S2. ?Abdomen: Morbidly obese.  Soft and nontender. ?Neuro: Awake and alert.  Moves all extremities. ?Extremities: Fullness of the ankle.    ? ?Data Reviewed: I have personally reviewed following labs and imaging studies ? ?CBC: ?Recent Labs  ?Lab 06/03/21 ?2335 06/04/21 ?0500 06/05/21 ?3005 06/07/21 ?1031 06/08/21 ?0901  ?WBC 13.7* 12.1* 15.8* 16.

## 2021-06-09 ENCOUNTER — Inpatient Hospital Stay (HOSPITAL_COMMUNITY): Payer: No Typology Code available for payment source

## 2021-06-09 LAB — RENAL FUNCTION PANEL
Albumin: 4.1 g/dL (ref 3.5–5.0)
Anion gap: 10 (ref 5–15)
BUN: 26 mg/dL — ABNORMAL HIGH (ref 8–23)
CO2: 30 mmol/L (ref 22–32)
Calcium: 9 mg/dL (ref 8.9–10.3)
Chloride: 93 mmol/L — ABNORMAL LOW (ref 98–111)
Creatinine, Ser: 0.9 mg/dL (ref 0.61–1.24)
GFR, Estimated: >60 mL/min (ref >60)
Glucose, Bld: 277 mg/dL — ABNORMAL HIGH (ref 70–99)
Phosphorus: 4.1 mg/dL (ref 2.5–4.6)
Potassium: 5.4 mmol/L — ABNORMAL HIGH (ref 3.5–5.1)
Sodium: 133 mmol/L — ABNORMAL LOW (ref 135–145)

## 2021-06-09 LAB — GLUCOSE, CAPILLARY
Glucose-Capillary: 239 mg/dL — ABNORMAL HIGH (ref 70–99)
Glucose-Capillary: 208 mg/dL — ABNORMAL HIGH (ref 70–99)
Glucose-Capillary: 192 mg/dL — ABNORMAL HIGH (ref 70–99)
Glucose-Capillary: 152 mg/dL — ABNORMAL HIGH (ref 70–99)

## 2021-06-09 LAB — CULTURE, BLOOD (ROUTINE X 2)
Culture: NO GROWTH
Culture: NO GROWTH
Special Requests: ADEQUATE
Special Requests: ADEQUATE

## 2021-06-09 MED ORDER — SALINE SPRAY 0.65 % NA SOLN
1.0000 | NASAL | Status: DC | PRN
  Administered 2021-06-09: 1 via NASAL
  Filled 2021-06-09: qty 44

## 2021-06-09 MED ORDER — BISACODYL 10 MG RE SUPP
10.0000 mg | Freq: Once | RECTAL | Status: AC
  Administered 2021-06-09: 10 mg via RECTAL
  Filled 2021-06-09: qty 1

## 2021-06-09 MED ORDER — PREDNISONE 20 MG PO TABS
20.0000 mg | ORAL_TABLET | Freq: Every day | ORAL | Status: DC
  Administered 2021-06-10 – 2021-06-11 (×2): 20 mg via ORAL
  Filled 2021-06-09 (×2): qty 1

## 2021-06-09 MED ORDER — CYCLOBENZAPRINE HCL 10 MG PO TABS
20.0000 mg | ORAL_TABLET | ORAL | Status: DC | PRN
  Administered 2021-06-09 – 2021-06-11 (×4): 20 mg via ORAL
  Filled 2021-06-09 (×4): qty 2

## 2021-06-09 MED ORDER — FUROSEMIDE 10 MG/ML IJ SOLN
40.0000 mg | Freq: Once | INTRAMUSCULAR | Status: AC
  Administered 2021-06-09: 40 mg via INTRAVENOUS
  Filled 2021-06-09: qty 4

## 2021-06-09 MED ORDER — LIDOCAINE VISCOUS HCL 2 % MT SOLN
15.0000 mL | Freq: Once | OROMUCOSAL | Status: AC
  Administered 2021-06-09: 15 mL via ORAL
  Filled 2021-06-09: qty 15

## 2021-06-09 MED ORDER — ALUM & MAG HYDROXIDE-SIMETH 200-200-20 MG/5ML PO SUSP
30.0000 mL | Freq: Four times a day (QID) | ORAL | Status: DC | PRN
  Administered 2021-06-09: 30 mL via ORAL
  Filled 2021-06-09 (×2): qty 30

## 2021-06-09 MED ORDER — ALUM & MAG HYDROXIDE-SIMETH 200-200-20 MG/5ML PO SUSP: 30.0000 mL | Freq: Once | ORAL | Status: AC

## 2021-06-09 MED ORDER — SODIUM ZIRCONIUM CYCLOSILICATE 5 G PO PACK
5.0000 g | PACK | Freq: Every day | ORAL | Status: AC
  Administered 2021-06-09 – 2021-06-10 (×2): 5 g via ORAL
  Filled 2021-06-09 (×2): qty 1

## 2021-06-09 NOTE — Progress Notes (Signed)
Pt c/o chest pain EKG obtained per protocol. SRP, RN ?

## 2021-06-09 NOTE — Progress Notes (Addendum)
PT Cancellation Note ? ?Patient Details ?Name: Mario [name] ?MRN: [id number] ?DOB: 1950/10/26 ? ? ?Cancelled Treatment:    Reason Eval/Treat Not Completed: Other (comment) ?Pt just got suppository and feels he will lose it if gets up and walks.  Will f/u as able later today. ? ?Attempted again about an hour later and pt still declined.  Will f/u later time. ? ?Abran Richard, PT ?Acute Rehab Services ?Pager [phone] ?[facility] [phone] ? ? ?Mario [name] ?06/09/2021, 2:23 PM ?

## 2021-06-09 NOTE — Progress Notes (Signed)
?PROGRESS NOTE ? ? ? ?Mario [name]  DQQ:[id number] DOB: [birth year] DOA: 06/03/2021 ?PCP: [facility]  ? ? ?Brief Narrative:  ? 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 ? ?Patient recently discharged from [facility] in which she was hospitalized 05/21/2021 through 05/24/2021 on the internal medicine teaching service for acute COPD exacerbation in the setting of community-acquired pneumonia.  Patient was treated with breathing treatments, steroids and antibiotics x5 days.  Following return home, continued with progressive shortness of breath.  Patient is prescribed continuous oxygen, but patient reports that he has not received an oxygen concentrator from the [place] to support this as of yet.  Patient also reports productive cough and subjective fever.  Denies chest pain, no diaphoresis, no nausea/vomiting/diarrhea, no chills/night sweats, no abdominal pain, no rash, no headache, no new lower extremity erythema or calf tenderness. ? ?In the ED, temperature 97.4 ?F, HR 87, RR 24, BP 1 3476, SPO2 90% on 4 L nasal cannula.  WBC 13.7, hemoglobin 14.5, platelets 359.  Sodium 133, potassium 4.1, chloride 99, CO2 23, glucose 188, BUN 17, creatinine 0.99.  BNP 29.6, high sensitive troponin 5.  COVID-19 PCR negative.  Influenza A/B PCR negative.  Chest x-ray with left pleural effusion with basilar consolidation versus atelectasis, unchanged.  Developing infiltration versus atelectasis right lung base.  CT angiogram chest with slightly prominent main pulmonary arteries, no arterial embolic filling defect, left lower lobe consolidation continues to be  seen, not significant change from 16 days prior but with increased underlying left pleural effusion which is predominantly subpulmonic and may be at least partially loculated with partial fascial extension of the fluid, no enhancing pleural rind around the fluid to suspect empyema and fluid is low in density consistent with transudative rather than exudative effusion, persistence of 1.5 cm fissural nodule or fluid in the lingular base, advanced emphysematous change, aortic/coronary artery atherosclerosis.  Patient was given albuterol neb, cefepime, IV vancomycin.  Hospital service consulted for further evaluation management of acute on chronic respiratory failure secondary to right lower lobe pneumonia complicated by acute COPD exacerbation. ? ?06/05/2021: Patient feels 40% improved.  Patient continues to report shortness of breath weight activities.  Not associated fever or chills.  No productive cough. ? ?06/07/2021: Patient seen.  Patient continues to deny any improvement.  Patient continues to report dyspnea on minimal exertion.  We will consult physical therapy team.  Patient is not particularly keen on being discharged back on.   ? ?06/08/21: Ambulatory SPO2 on 3L demonstrating hypoxia as expected, 89%. +Fatigue, SOB. Also requesting Flexeril 30 qhs, oxycodone (not onhome meds) for back pain.  ? ?06/09/21, yesterday ambulatory spo2 demonstrates patient has weakness/exertion and this could also be from deconditioning, need aggressive PT. ?Continue pulmonary treatment. CXR today is improved, pl effusion small not worth draining at this time.  ? ? ?Assessment & Plan: ? ?Acute on chronic hypoxic respiratory failure, POA ?Patient presenting to ED with progressive shortness of breath over the last 3-4 days associated with wheezing.  Etiology likely multifactorial with persistent pneumonia, now developing right lower lobe with acute COPD exacerbation.  Patient on 3 L nasal cannula at baseline, also  concerned that patient  is not receiving his home oxygen as needed. ?-- Continue treatment of pneumonia and COPD exacerbation as below ?-- Strict titrate supplemental oxygen to maintain SPO2 greater than 88%, 3 L nasal cannula baseline ?-- TOC for home O2 needs ?06/07/2021: Continue IV Solu-Medrol, nebs DuoNeb and Pulmicort and antibiotics, incentive spirometry and flutter valve device therapy. ?06/08/21: Continue above therapy, will likely need SNF placement given deconditioning. Follow PT eval ? ?Community-acquired pneumonia ?Patient recently hospitalized and completed 5-day course of antibiotics for commune acquired pneumonia with Augmentin and azithromycin.  Complicated by immunocompromise state due to treatment of ankylosing spondylitis/uveitis with methotrexate and mycophenolate.  Imaging studies show persistent consolidative process left lower lobe with new development of a right lower lobe infiltrate.  Patient with elevated WBC count of 13.7.  BNP within normal limits.  MRSA PCR negative, vancomycin discontinued. ?--Blood cultures x2: Pending ?--Cefepime ?--Supplemental oxygen, maintain SPO2 greater than 88% ?--Mucinex 600 mg p.o. twice daily ?--Flutter valve, incentive spirometry ?--CBC daily ?06/07/2021: Complete course of antibiotics.   ? ? ?Acute COPD exacerbation ?Patient reports progressive wheezing over the last 3-4 days.  Not improved with home nebulizer.  Reports compliance with his home inhalers. ?--Brovana neb twice daily ?--Pulmicort neb twice daily ?--Solu-Medrol 80 mg IV every 12 hours ?--Albuterol/DuoNebs as needed ?--Continue supplemental oxygen as above, baseline 3 L nasal cannula ?06/07/2021: Patient continues to deny any improvement.  Suspect patient is not particularly keen on being discharged back on. ? ?Essential hypertension ?--Cardizem CD1 180 mg p.o. daily ?--Lisinopril 20 mg p.o. daily ? ?Type 2 diabetes mellitus ?Home regimen includes metformin 500 mg p.o. twice daily. ?--Hold oral hypoglycemics while  inpatient ?--SSI for coverage ?--CBGs before every meal/at bedtime ? ?History of pulmonary embolism ?Diagnosed December 2022.  Remains on Eliquis, no missed doses.  CT angiogram chest on admission negative for PE. ?--Continue Eliquis 5 g p.o. twice daily ? ?Depression/anxiety ?--Trileptal ?--Latuda ?--Bupropion ?--BuSpar ? ?BPH ?--Tamsulosin 0.4 mg p.o. daily ? ?OSA ?--Continue nocturnal CPAP ? ?Hx non-small cell lung cancer stage II s/p radiation ?Outpatient follow-up with oncology. ? ?History of ankylosing spondylitis/uveitis ?Chronic, stable.  Continue home methotrexate and mycophenolate. ? ?Physical deconditioning: ?--PT/OT evaluation  ?OOB TID ? ?Morbid obesity ?Body mass index is 42.51 kg/m?[name]  Discussed with patient needs for aggressive lifestyle changes/weight loss as this complicates all facets of care.  Outpatient follow-up with PCP.   ? ? ? ?DVT prophylaxis: SCDs Start: 06/04/21 0139 ?apixaban (ELIQUIS) tablet 5 mg  ?  Code Status: Full Code ?Family Communication:  ? ?Disposition Plan:  ?Level of care: Telemetry ?Status is: Inpatient ?Remains inpatient appropriate because: IV antibiotics ?  ? ?Consultants:  ?None ? ?Procedures:  ?None ? ?Antimicrobials:  ?Vancomycin 4/6 - 4/7 ?Cefepime 4/6>> ? ? ?Subjective: ?Patient seen ?Patient continues to deny any improvement.   ? ?Objective: ?Vitals:  ? 06/09/21 0448 06/09/21 0500 06/09/21 0825 06/09/21 1327  ?BP: (!) 157/96   117/81  ?Pulse: 93   (!) 107  ?Resp: 20   20  ?Temp: 98 ?F (36.7 ?C)   98 ?F (36.7 ?C)  ?TempSrc: Oral   Oral  ?SpO2: 94%  94% 94%  ?Weight:  (!) 150.2 kg    ?Height:      ? ? ?Intake/Output Summary (Last 24 hours) at 06/09/2021 1513 ?Last data filed at 06/09/2021 0910 ?Gross per 24 hour  ?Intake 240 ml  ?Output --  ?Net 240 ml  ? ? ?Filed Weights  ? 06/07/21 0500 06/08/21 0500 06/09/21  0500  ?Weight: (!) 150.5 kg (!) 151.1 kg (!) 150.2 kg  ? ? ?Examination: ? ?Physical Exam: ?GEN: Patient is morbidly obese.  Patient is awake and alert. ?HEENT:  Patient is pale.  No jaundice. ?Neck: Supple.  No raised JVD. ?Lungs: Decreased air entry. ?CVS: S1-S2. ?Abdomen: Morbidly obese.  Soft and nontender. ?Neuro: Awake and alert.  Moves all extremities. ?Extremi

## 2021-06-09 NOTE — Discharge Instructions (Signed)

## 2021-06-10 LAB — GLUCOSE, CAPILLARY
Glucose-Capillary: 181 mg/dL — ABNORMAL HIGH (ref 70–99)
Glucose-Capillary: 127 mg/dL — ABNORMAL HIGH (ref 70–99)
Glucose-Capillary: 166 mg/dL — ABNORMAL HIGH (ref 70–99)
Glucose-Capillary: 168 mg/dL — ABNORMAL HIGH (ref 70–99)

## 2021-06-10 MED ORDER — MAGNESIUM HYDROXIDE 400 MG/5ML PO SUSP
30.0000 mL | Freq: Once | ORAL | Status: AC
  Administered 2021-06-10: 30 mL via ORAL
  Filled 2021-06-10: qty 30

## 2021-06-10 MED ORDER — LEVOFLOXACIN 750 MG PO TABS: 750.0000 mg | ORAL_TABLET | Freq: Every day | ORAL | 0 refills | Status: AC

## 2021-06-10 MED ORDER — POLYETHYLENE GLYCOL 3350 17 G PO PACK
34.0000 g | PACK | Freq: Every day | ORAL | Status: DC
  Administered 2021-06-10 – 2021-06-11 (×2): 34 g via ORAL
  Filled 2021-06-10 (×3): qty 2

## 2021-06-10 MED ORDER — PREDNISONE 20 MG PO TABS: 20.0000 mg | ORAL_TABLET | Freq: Every day | ORAL | 0 refills | Status: AC

## 2021-06-10 NOTE — Plan of Care (Signed)

## 2021-06-10 NOTE — Progress Notes (Shared)
Pending DC today

## 2021-06-10 NOTE — Plan of Care (Signed)

## 2021-06-10 NOTE — Progress Notes (Signed)
Discussed with patient that he has been discharged.  Patient upset, states we "might as well roll him down to the ER because he will be right back".  Patient not happy that he has not had a bowel movement in spite of Colace, Dulcolax tablets and Miralax.  MD had addressed this by ordering Milk of Magnesia and a soap suds enema.  Soap suds enema yielded very small amount of formed stool.  Patient very concerned about going home with constipation.   ? ?Patient wants to speak to MD, notified MD of above.   ?

## 2021-06-10 NOTE — Discharge Summary (Addendum)
?Physician Discharge Summary ?  ?Patient: Mario [name] MRN: [id number] DOB: 1950/07/04  ?Admit date:     06/03/2021  ?Discharge date: 06/10/21  ?Discharge Physician: Vanna Scotland  ? ?PCP: [facility]  ? ?Recommendations at discharge:  ?Follow up with Pulmonology ?[facility] ? ?Discharge Diagnoses: ?Principal Problem: ?  HCAP (healthcare-associated pneumonia) ?Active Problems: ?  DM2 (diabetes mellitus, type 2) ([place]) ?  COPD with acute exacerbation ([place]) ?  Acute respiratory failure with hypoxia ([place]) ?  Hypertension ?  Allergic rhinitis ?  History of pulmonary embolism ?  Acute on chronic respiratory failure with hypoxia (HCC) ? ?Resolved Problems: ?  * No resolved hospital problems. * ? ?Hospital Course: ?No notes on file ?71 y.o. male with medical history significant for COPD 3 L limited to nocturnal timeframe, essential pretension, acute pulmonary embolism diagnosed in December 2022, allergic rhinitis, who is admitted to [facility] on 06/03/2021 with severe sepsis due to right lower lobe pneumonia after presenting from home to [facility] ED complaining of shortness of breath.  ?  ?The patient was recently hospitalized for acute COPD exacerbation in the setting of suspected acquired pneumonia for which he received IV antibiotics.  Over the course of that hospitalization, he reports that his breathing improved with breathing treatments, steroids, and attics.  Only discharged home approximately 1 week ago, reports ensuing development of progressive shortness of breath over the last 3 to 4 days associated new onset productive cough and subjective fever.  Is any associated orthopnea, PND, or new onset peripheral edema.  Not associate with any chest pain, diaphoresis, potation's, nausea, vomiting, presyncope, or syncope.  No hemoptysis.  He does note some intermittent wheezing over the last few days.  No new lower extremity erythema or calf tenderness.  No recent trauma or travel.  Denies any headache  or neck stiffness, abdominal pain, nausea, vomiting, diarrhea, or rash. ?  ?He acknowledges a history of COPD, reports that he is on 3 L nasal cannula during nocturnal timeframe alone.  Denies any known history of diurnal supplemental oxygen requirements ? ?Treated for HCAP with cefepime and deconditioning during hospital stay. ?Given bowel regimen for complaints of constipation. ? ?Assessment and Plan: ?* HCAP (healthcare-associated pneumonia) ?Patient recently hospitalized and completed 5-day course of antibiotics for commune acquired pneumonia with Augmentin and azithromycin.  Complicated by immunocompromise state due to treatment of ankylosing spondylitis/uveitis with methotrexate and mycophenolate.  Imaging studies show persistent consolidative process left lower lobe with new development of a right lower lobe infiltrate.  Patient with elevated WBC count of 13.7.  BNP within normal limits.  MRSA PCR negative, vancomycin discontinued. ?L pleural fluid, POA, now improved with gentle diuresis. ?CTA chest with No PE noted ?--Cefepime ?--Supplemental oxygen, maintain SPO2 greater than 88% ?--Mucinex 600 mg p.o. twice daily ?--Flutter valve, incentive spirometry ?--CBC daily ?06/07/2021: Complete course of antibiotics.   ?Component of deconditioning, needs to ambulate and continue PT ? ? ?History of pulmonary embolism ?#) History of acute pulmonary embolism: Documentation of diagnosis of acute pulmonary embolism December 2022, with patient reporting good compliance with ongoing Eliquis therapy. ?CTA Chest without evidence of PE ?Plan: Continue home Eliquis. ? ?Allergic rhinitis ?Plan: cont home Flonase.  ? ?Hypertension ?Essential Hypertension: documented h/o such, with outpatient antihypertensive regimen including lisinopril, diltiazem.  SBP's in the ED today: In the 130s.  ?: Close monitoring of subsequent BP via routine VS. continue home antihypertensive medications. ? ? ?Acute respiratory failure with hypoxia  ([place]) ?#)  HCAP therapy as above, flutter valve, deconditioning ?Completed inpatient therapy with cefepime, Pulmonary hygeine here while inpatient. Flutter valve. ?CXR showed dramatic improvement in LLL consolidation, aeration and L pl effusion.  ?Keep O2>88% at home. ?Consider palliative care for COPD, h/o lung cancer in the outpatient setting ?- f/u pulmonology. ? ?COPD with acute exacerbation ([place]) ?S/p steroids, continue management of HCAP and hypoxia as above. ?Goal SPO2>88% ?- consider palliative care given multiple hospital admissions ? ? ?DM2 (diabetes mellitus, type 2) ([place]) ?Plan: accuchecks QAC and HS with low dose SSI. hold home oral hypoglycemic agents during this hospitalization.  ? ?  ? ?Pain control - [employer] Controlled Substance Reporting System database was reviewed. and patient was instructed, not to drive, operate heavy machinery, perform activities at heights, swimming or participation in water activities or provide baby-sitting services while on Pain, Sleep and Anxiety Medications; until their outpatient Physician has advised to do so again. Also recommended to not to take more than prescribed Pain, Sleep and Anxiety Medications.  ?Consultants: N/a ?Procedures performed: n/a  ?Disposition: Home ?Diet recommendation:  ?Carb modified diet ?DISCHARGE MEDICATION: ?Allergies as of 06/10/2021   ? ?   Reactions  ? Demerol [meperidine] Nausea And Vomiting, Other (See Comments)  ? Made the patient "violently sick"  ? Zocor [simvastatin] Nausea And Vomiting, Other (See Comments)  ? Made him very jittery, also  ? ?  ? ?  ?Medication List  ?  ? ?STOP taking these medications   ? ?acetaminophen 500 MG tablet ?Commonly known as: TYLENOL ?  ? ?  ? ?TAKE these medications   ? ?albuterol 108 (90 Base) MCG/ACT inhaler ?Commonly known as: VENTOLIN HFA ?Inhale 2 puffs into the lungs every 6 (six) hours as needed for wheezing. ?What changed: Another medication with the same name was changed. Make sure you  understand how and when to take each. ?  ?albuterol (2.5 MG/3ML) 0.083% nebulizer solution ?Commonly known as: PROVENTIL ?Inhale 3 mLs (2.5 mg total) by nebulization every 4 (four) hours as needed for wheezing or shortness of breath. ?What changed: when to take this ?  ?apixaban 5 MG Tabs tablet ?Commonly known as: ELIQUIS ?Take 5 mg by mouth 2 (two) times daily. ?  ?aspirin 81 MG EC tablet ?Take 81 mg by mouth daily. ?  ?b complex vitamins capsule ?Take 1 capsule by mouth daily. ?  ?bisacodyl 5 MG EC tablet ?Commonly known as: DULCOLAX ?Take 1 tablet (5 mg total) by mouth daily at 12 noon. ?What changed: when to take this ?  ?buPROPion 300 MG 24 hr tablet ?Commonly known as: WELLBUTRIN XL ?Take 300 mg by mouth daily. ?  ?busPIRone 15 MG tablet ?Commonly known as: BUSPAR ?Take 15 mg by mouth daily. ?  ?calcium-vitamin D 500-200 MG-UNIT tablet ?Commonly known as: OSCAL WITH D ?Take 1 tablet by mouth 2 (two) times daily with a meal. ?  ?carboxymethylcellulose 0.5 % Soln ?Commonly known as: REFRESH PLUS ?Place 1 drop into both eyes 4 (four) times daily as needed (dry eyes). ?  ?clobetasol ointment 0.05 % ?Commonly known as: TEMOVATE ?Apply 1 application. topically 2 (two) times daily as needed (itching). ?  ?clotrimazole-betamethasone cream ?Commonly known as: LOTRISONE ?Apply topically 2 (two) times daily. ?What changed:  ?how much to take ?when to take this ?reasons to take this ?  ?cyclobenzaprine 10 MG tablet ?Commonly known as: FLEXERIL ?Take 30 mg by mouth at bedtime. ?  ?cycloSPORINE 0.05 % ophthalmic emulsion ?Commonly known as: RESTASIS ?Place 1 drop  into both eyes 2 (two) times daily. ?  ?diltiazem 180 MG 24 hr capsule ?Commonly known as: CARDIZEM CD ?Take 180 mg by mouth daily. ?  ?docusate sodium 100 MG capsule ?Commonly known as: COLACE ?Take 2 capsules (200 mg total) by mouth 2 (two) times daily. ?What changed: when to take this ?  ?DULoxetine 60 MG capsule ?Commonly known as: CYMBALTA ?Take 1 capsule (60  mg total) by mouth 2 (two) times daily. ?  ?eucerin cream ?Apply 1 application. topically 2 (two) times daily as needed for dry skin. ?  ?feeding supplement Liqd ?Take 237 mLs by mouth 3 (three) times daily

## 2021-06-11 DIAGNOSIS — J189 Pneumonia, unspecified organism: Secondary | ICD-10-CM | POA: Diagnosis not present

## 2021-06-11 LAB — GLUCOSE, CAPILLARY: Glucose-Capillary: 177 mg/dL — ABNORMAL HIGH (ref 70–99)

## 2021-06-11 NOTE — Progress Notes (Signed)
Patient seen and examined this morning. He was discharged yesterday, awaited [facility] transport until this morning, after receiving 8 days of IV antibiotics and steroids (solumedrol > prednisone) for pneumonia with COPD exacerbation. He has returned to baseline level of hypoxia (nocturnal and exertional only), shown radiologic improvement, and feels more comfortable with discharge today. He has chronic constipation and reports he wishes he could have more regular BMs. He has colonoscopy scheduled next month and had some output yesterday with enema. He's eating without nausea or vomiting.  ? ?BP 132/76 (BP Location: Left Arm)   Pulse 90   Temp 97.7 ?F (36.5 ?C) (Oral)   Resp 18   Ht 6\' 2"  (1.88 m)   Wt (!) 152.1 kg   SpO2 97%   BMI 43.05 kg/m?   ?No distress, obese, pleasant, interactive sitting up at EOB ?Clear with without wheezes, decreased both globally and at bases. ?Abd is soft, not tender with active bowel sounds.  ? ?Mr. Bunte is currently stable for discharge and is amenable to doing so. He will not require any further antibiotics and can continue low dose prednisone taper at home. ? ?Vance Gather, MD ?06/11/2021 11:10 AM   ?

## 2021-06-11 NOTE — TOC Progression Note (Signed)
Transition of Care (TOC) - Progression Note  ? ? ?Patient Details  ?Name: Mario [name] ?MRN: [id number] ?Date of Birth: 08-05-1950 ? ?Transition of Care (TOC) CM/SW Contact  ?Purcell Mouton, RN ?Phone Number: ?06/11/2021, 10:30 AM ? ?Clinical Narrative:    ?Spoke with pt concerning discharge this am. Pt states that the last time it took [facility] 11 hrs and asked how long it would be. Pt agreed with discharge and is stable. [facility] was called pt is aware and nursing staff.  ? ? ?Expected Discharge Plan: [place] ?Barriers to Discharge: Continued Medical Work up ? ?Expected Discharge Plan and Services ?Expected Discharge Plan: [place] ?  ?  ?  ?Living arrangements for the past 2 months: Apartment ?Expected Discharge Date: 06/10/21               ?  ?  ?  ?  ?  ?  ?  ?  ?  ?  ? ? ?Social Determinants of Health (SDOH) Interventions ?  ? ?Readmission Risk Interventions ? ?  06/07/2021  ?  4:15 PM  ?Readmission Risk Prevention Plan  ?Transportation Screening Complete  ?Medication Review Press photographer) Complete  ?PCP or Specialist appointment within 3-5 days of discharge Complete  ?[place] or Home Care Consult Complete  ?SW Recovery Care/Counseling Consult Complete  ?Palliative Care Screening Not Applicable  ?[place] Not Applicable  ? ? ?

## 2021-06-13 ENCOUNTER — Other Ambulatory Visit: Payer: Self-pay

## 2021-06-13 ENCOUNTER — Emergency Department (HOSPITAL_COMMUNITY): Payer: No Typology Code available for payment source

## 2021-06-13 ENCOUNTER — Emergency Department (HOSPITAL_COMMUNITY)
Admission: EM | Admit: 2021-06-13 | Discharge: 2021-06-14 | Disposition: A | Discharge status: Home / Self Care | Payer: No Typology Code available for payment source | Attending: Emergency Medicine | Admitting: Emergency Medicine

## 2021-06-13 DIAGNOSIS — Z85118 Personal history of other malignant neoplasm of bronchus and lung: Secondary | ICD-10-CM | POA: Insufficient documentation

## 2021-06-13 DIAGNOSIS — Z7982 Long term (current) use of aspirin: Secondary | ICD-10-CM | POA: Insufficient documentation

## 2021-06-13 DIAGNOSIS — E1165 Type 2 diabetes mellitus with hyperglycemia: Secondary | ICD-10-CM | POA: Insufficient documentation

## 2021-06-13 DIAGNOSIS — R42 Dizziness and giddiness: Secondary | ICD-10-CM

## 2021-06-13 DIAGNOSIS — R531 Weakness: Secondary | ICD-10-CM | POA: Diagnosis not present

## 2021-06-13 DIAGNOSIS — D72829 Elevated white blood cell count, unspecified: Secondary | ICD-10-CM | POA: Diagnosis not present

## 2021-06-13 DIAGNOSIS — I10 Essential (primary) hypertension: Secondary | ICD-10-CM | POA: Diagnosis not present

## 2021-06-13 DIAGNOSIS — R0789 Other chest pain: Secondary | ICD-10-CM | POA: Insufficient documentation

## 2021-06-13 DIAGNOSIS — Z79899 Other long term (current) drug therapy: Secondary | ICD-10-CM | POA: Diagnosis not present

## 2021-06-13 DIAGNOSIS — Z7952 Long term (current) use of systemic steroids: Secondary | ICD-10-CM | POA: Diagnosis not present

## 2021-06-13 DIAGNOSIS — J441 Chronic obstructive pulmonary disease with (acute) exacerbation: Secondary | ICD-10-CM | POA: Diagnosis not present

## 2021-06-13 DIAGNOSIS — Z7984 Long term (current) use of oral hypoglycemic drugs: Secondary | ICD-10-CM | POA: Insufficient documentation

## 2021-06-13 DIAGNOSIS — Z7901 Long term (current) use of anticoagulants: Secondary | ICD-10-CM | POA: Diagnosis not present

## 2021-06-13 DIAGNOSIS — Z7951 Long term (current) use of inhaled steroids: Secondary | ICD-10-CM | POA: Insufficient documentation

## 2021-06-13 NOTE — ED Provider Triage Note (Signed)
Emergency Medicine Provider Triage Evaluation Note ? ?Mario [name] , a 71 y.o. male  was evaluated in triage.  Pt complains of chest tightness and shortness of breath ? ?Review of Systems  ?Positive: Pressure on chest ?Negative: fever ? ?Physical Exam  ?BP (!) 179/146 (BP Location: Left Arm)   Pulse 89   Temp 97.9 ?F (36.6 ?C) (Oral)   Resp 18   SpO2 96%  ?Gen:   Awake, no distress   ?Resp:  Normal effort  ?MSK:   Moves extremities without difficulty  ?Other:   ? ?Medical Decision Making  ?Medically screening exam initiated at 7:35 PM.  Appropriate orders placed.  Mario [name] was informed that the remainder of the evaluation will be completed by another provider, this initial triage assessment does not replace that evaluation, and the importance of remaining in the ED until their evaluation is complete. ? ? ?  ?Fransico Meadow, [place] ?06/13/21 1936 ? ?

## 2021-06-13 NOTE — ED Triage Notes (Addendum)
BIB EMS for weakness/dizziness.  Pt states his HR is low, states it is usually 110 and it is 90 right now. BP 153/76, O2 94% on 3L which he wears all the time.  COPD.  Discharged this week after pneumonia admission.  Finishing his anbx at home.  ? ?

## 2021-06-14 LAB — COMPREHENSIVE METABOLIC PANEL
ALT: 25 U/L (ref 0–44)
AST: 11 U/L — ABNORMAL LOW (ref 15–41)
Albumin: 3.8 g/dL (ref 3.5–5.0)
Alkaline Phosphatase: 64 U/L (ref 38–126)
Anion gap: 11 (ref 5–15)
BUN: 20 mg/dL (ref 8–23)
CO2: 22 mmol/L (ref 22–32)
Calcium: 9.5 mg/dL (ref 8.9–10.3)
Chloride: 101 mmol/L (ref 98–111)
Creatinine, Ser: 0.96 mg/dL (ref 0.61–1.24)
GFR, Estimated: >60 mL/min (ref >60)
Glucose, Bld: 216 mg/dL — ABNORMAL HIGH (ref 70–99)
Potassium: 4.2 mmol/L (ref 3.5–5.1)
Sodium: 134 mmol/L — ABNORMAL LOW (ref 135–145)
Total Bilirubin: 0.3 mg/dL (ref 0.3–1.2)
Total Protein: 6.5 g/dL (ref 6.5–8.1)

## 2021-06-14 LAB — CBC WITH DIFFERENTIAL/PLATELET
Abs Immature Granulocytes: 0.15 10*3/uL — ABNORMAL HIGH (ref 0.00–0.07)
Basophils Absolute: 0.1 10*3/uL (ref 0.0–0.1)
Basophils Relative: 0 %
Eosinophils Absolute: 0.3 10*3/uL (ref 0.0–0.5)
Eosinophils Relative: 2 %
HCT: 46.1 % (ref 39.0–52.0)
Hemoglobin: 15 g/dL (ref 13.0–17.0)
Immature Granulocytes: 1 %
Lymphocytes Relative: 21 %
Lymphs Abs: 2.4 10*3/uL (ref 0.7–4.0)
MCH: 32 pg (ref 26.0–34.0)
MCHC: 32.5 g/dL (ref 30.0–36.0)
MCV: 98.3 fL (ref 80.0–100.0)
Monocytes Absolute: 1.3 10*3/uL — ABNORMAL HIGH (ref 0.1–1.0)
Monocytes Relative: 11 %
Neutro Abs: 7.3 10*3/uL (ref 1.7–7.7)
Neutrophils Relative %: 65 %
Platelets: 306 10*3/uL (ref 150–400)
RBC: 4.69 MIL/uL (ref 4.22–5.81)
RDW: 14.5 % (ref 11.5–15.5)
WBC: 11.5 10*3/uL — ABNORMAL HIGH (ref 4.0–10.5)
nRBC: 0 % (ref 0.0–0.2)

## 2021-06-14 LAB — TROPONIN I (HIGH SENSITIVITY)
Troponin I (High Sensitivity): 8 ng/L (ref <18)
Troponin I (High Sensitivity): 7 ng/L (ref <18)

## 2021-06-14 LAB — BRAIN NATRIURETIC PEPTIDE: B Natriuretic Peptide: 23.3 pg/mL (ref 0.0–100.0)

## 2021-06-14 MED ORDER — IPRATROPIUM-ALBUTEROL 0.5-2.5 (3) MG/3ML IN SOLN
3.0000 mL | Freq: Once | RESPIRATORY_TRACT | Status: AC
  Administered 2021-06-14: 3 mL via RESPIRATORY_TRACT
  Filled 2021-06-14: qty 3

## 2021-06-14 MED ORDER — SODIUM CHLORIDE 0.9 % IV BOLUS
1000.0000 mL | Freq: Once | INTRAVENOUS | Status: AC
  Administered 2021-06-14: 1000 mL via INTRAVENOUS

## 2021-06-14 NOTE — Discharge Instructions (Addendum)
Be sure you are drinking appropriate amount of fluids.  If you develop new or worsening weakness, especially on one side of your body, headache, chest pain, new or worsening shortness of breath, or any other new/concerning symptoms then return to the ER or call 911. ?

## 2021-06-14 NOTE — ED Notes (Signed)
[facility] has been dispatched.  ?

## 2021-06-14 NOTE — ED Provider Notes (Signed)
Care transferred to me.  Patient still feels a little generally weak but was able to walk and did not have to require assistance.  This seems unlikely to be an acute stroke/vertiginous process.  He is currently eating food and was given a bolus of fluids and while he does not feel perfectly better, he does feel well enough he thinks he can go home.  Will discharge home with return precautions. ?  ?Sherwood Gambler, MD ?06/14/21 1026 ? ?

## 2021-06-14 NOTE — ED Notes (Signed)
Pt ambulatory on RA with cane at P-110 and 02 91%.  ?

## 2021-06-14 NOTE — ED Provider Notes (Signed)
[facility]Provider Note ? ?CSN: [id number] ?Arrival date & time: 06/13/21 1836 ? ?Chief Complaint(s) ?Weakness and Dizziness ? ?HPI ?Mario [name] is a 70 y.o. male with a past medical history listed below including hypertension, PE on Eliquis, non-small cell lung cancer, COPD on 3 L nasal cannula, currently being treated for pneumonia with Levaquin who presents to the emergency department with 1 day of orthostatic lightheadedness.  He reports that yesterday afternoon while getting up out of bed he felt lightheaded and unsteady on his feet.  He endorsed feeling like he was going to pass out.  Endorses nausea without emesis.  Reports that he is tolerating the antibiotic well.  No diarrhea.  There is no associated chest pain during that time.  He reports he has been short of breath but that is normal for his COPD.  No increase in oxygen requirement. ? ?He did report having some mild chest discomfort upon arrival here. Chest pain has subsided. SOB is mild but persistent. ?He has had a prolonged wait in the emergency waiting room.  ?Patient still endorsing lightheadedness upon standing. ? ?The history is provided by the patient.  ? ?Past Medical History ?Past Medical History:  ?Diagnosis Date  ? Anxiety   ? Bronchitis   ? COPD (chronic obstructive pulmonary disease) (HCC)   ? Depression   ? History of radiation therapy   ? Left lung- 10/05/20-10/15/20- Dr. James Kinard  ? Hypertension   ? lung ca 09/2020  ? Mental disorder   ? MI (myocardial infarction) (HCC)   ? ????  ? OSA (obstructive sleep apnea)   ? Suicide attempt (HCC)   ? Tension pneumothorax 06/27/2016  ? Uveitis   ? ?Patient Active Problem List  ? Diagnosis Date Noted  ? Acute respiratory failure with hypoxia (HCC) 06/04/2021  ? Acute on chronic respiratory failure with hypoxia (HCC) 06/04/2021  ? Hypertension   ? Allergic rhinitis   ? History of pulmonary embolism   ? Acute hypoxemic respiratory failure (HCC) 05/21/2021   ? Hyponatremia 05/20/2021  ? Acute pulmonary embolism without acute cor pulmonale (HCC) 02/03/2021  ? Mood disorder (HCC) 02/03/2021  ? Acute on chronic respiratory failure (HCC) 01/09/2021  ? Non-small cell cancer of left lung (HCC) 09/21/2020  ? Lung nodule 07/30/2020  ? Mass of left lung   ? Acute respiratory failure with hypoxemia (HCC) 07/26/2020  ? Constipation   ? HCAP (healthcare-associated pneumonia) 04/03/2020  ? COPD with acute exacerbation (HCC) 01/05/2020  ? Vertigo 12/26/2019  ? 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 Stage 3a chronic kidney disease (HCC) 12/26/2019  ? Abnormal CT of the chest 12/26/2019  ? COPD (chronic obstructive pulmonary disease) (HCC)   ? AKI (acute kidney injury) (HCC)   ? Hyperglycemia 06/23/2019  ? OSA (obstructive sleep apnea) 06/23/2019  ? Suicidal ideation 06/23/2019  ? Major depressive disorder, recurrent episode (HCC) 10/12/2018  ? 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 Cellulitis of both feet 07/16/2017  ? DM2 (diabetes mellitus, type 2) (HCC) 07/16/2017  ? HLA B27 (HLA B27 positive) 07/16/2017  ? Acute chest pain   ? Hyperkalemia   ? Spontaneous pneumothorax 06/27/2016  ? Hypoxia   ? Pneumothorax on left   ? Dyspnea 05/23/2014  ? COPD exacerbation (HCC) 05/23/2014  ? Malingering 01/21/2013  ? Depression 01/11/2013  ? Tobacco abuse 01/11/2013  ? Obesity, unspecified 01/11/2013  ? Chest pain 01/10/2013  ? Hypertension associated with diabetes (HCC) 01/10/2013  ? ?  Home Medication(s) ?Prior to Admission medications   ?Medication Sig Start Date End Date Taking? Authorizing Provider  ?albuterol (PROVENTIL) (2.5 MG/3ML) 0.083% nebulizer solution Inhale 3 mLs (2.5 mg total) by nebulization every 4 (four) hours as needed for wheezing or shortness of breath. ?Patient taking differently: Take 2.5 mg by nebulization every 6 (six) hours as needed for wheezing or shortness of breath. 08/04/20 08/04/21 Yes Patel, Pranav M, MD  ?albuterol (VENTOLIN  HFA) 108 (90 Base) MCG/ACT inhaler Inhale 2 puffs into the lungs every 6 (six) hours as needed for wheezing. 06/02/20  Yes [provider]  ?apixaban (ELIQUIS) 5 MG TABS tablet Take 5 mg by mouth 2 (two) times daily.   Yes [provider]  ?aspirin 81 MG EC tablet Take 81 mg by mouth daily.   Yes [provider]  ?b complex vitamins capsule Take 1 capsule by mouth daily.   Yes [provider]  ?bisacodyl (DULCOLAX) 5 MG EC tablet Take 1 tablet (5 mg total) by mouth daily at 12 noon. ?Patient taking differently: Take 5 mg by mouth daily. 08/04/20  Yes Patel, Pranav M, MD  ?buPROPion (WELLBUTRIN XL) 300 MG 24 hr tablet Take 300 mg by mouth daily. 02/05/20  Yes [provider]  ?busPIRone (BUSPAR) 15 MG tablet Take 15 mg by mouth daily.   Yes [provider]  ?calcium-vitamin D (OSCAL WITH D) 500-200 MG-UNIT tablet Take 1 tablet by mouth 2 (two) times daily with a meal.   Yes [provider]  ?carboxymethylcellulose (REFRESH PLUS) 0.5 % SOLN Place 1 drop into both eyes 4 (four) times daily as needed (dry eyes).   Yes [provider]  ?Cholecalciferol (VITAMIN D-3) 25 MCG (1000 UT) CAPS Take 1,000 Units by mouth 2 (two) times daily.   Yes [provider]  ?clobetasol ointment (TEMOVATE) 0.05 % Apply 1 application. topically 2 (two) times daily as needed (for scars).   Yes [provider]  ?clotrimazole-betamethasone (LOTRISONE) cream Apply topically 2 (two) times daily. ?Patient taking differently: Apply 1 application. topically 2 (two) times daily as needed (fungus). 05/21/21  Yes Dean, Emily, DO  ?cyclobenzaprine (FLEXERIL) 10 MG tablet Take 30 mg by mouth at bedtime.   Yes [provider]  ?cycloSPORINE (RESTASIS) 0.05 % ophthalmic emulsion Place 1 drop into both eyes 2 (two) times daily.   Yes [provider]  ?diltiazem (CARDIZEM CD) 180 MG 24 hr capsule Take 180 mg by mouth daily.   Yes [provider]   ?docusate sodium (COLACE) 100 MG capsule Take 2 capsules (200 mg total) by mouth 2 (two) times daily. ?Patient taking differently: Take 200 mg by mouth daily. 08/04/20  Yes Patel, Pranav M, MD  ?DULoxetine (CYMBALTA) 60 MG capsule Take 1 capsule (60 mg total) by mouth 2 (two) times daily. 07/21/17  Yes Patel, Pranav M, MD  ?feeding supplement (ENSURE ENLIVE / ENSURE PLUS) LIQD Take 237 mLs by mouth 3 (three) times daily between meals. ?Patient taking differently: Take 1 Bottle by mouth daily. 08/04/20  Yes Patel, Pranav M, MD  ?fluticasone (FLONASE) 50 MCG/ACT nasal spray Place 2 sprays into both nostrils daily.    Yes [provider]  ?folic acid (FOLVITE) 1 MG tablet Take 1 mg by mouth daily.   Yes [provider]  ?gabapentin (NEURONTIN) 300 MG capsule Take 900 mg by mouth at bedtime.   Yes [provider]  ?GARLIC OIL PO Take 1 capsule by mouth daily.   Yes [provider]  ?  guaiFENesin (MUCINEX) 600 MG 12 hr tablet Take 1 tablet (600 mg total) by mouth 2 (two) times daily. ?Patient taking differently: Take 600 mg by mouth 2 (two) times daily as needed for cough. 05/21/21  Yes Dean, Emily, DO  ?levofloxacin (LEVAQUIN) 750 MG tablet Take 1 tablet (750 mg total) by mouth daily for 4 days. 06/10/21 06/14/21 Yes Kaul, Aseem, MD  ?lisinopril (ZESTRIL) 20 MG tablet Take 20 mg by mouth daily.   Yes [provider]  ?lurasidone (LATUDA) 40 MG TABS tablet Take 40 mg by mouth daily after supper.   Yes [provider]  ?Melatonin 3 MG TABS Take 6 mg by mouth at bedtime.   Yes [provider]  ?metFORMIN (GLUCOPHAGE) 500 MG tablet Take 500 mg by mouth 2 (two) times daily with a meal.   Yes [provider]  ?methotrexate (RHEUMATREX) 2.5 MG tablet Take 15 mg by mouth once a week.   Yes [provider]  ?Mometasone Furoate 200 MCG/ACT AERO Inhale 2 puffs into the lungs daily.   Yes [provider]  ?mycophenolate (CELLCEPT) 250 MG capsule Take  1,500 mg by mouth 2 (two) times daily.   Yes [provider]  ?OVER THE COUNTER MEDICATION Take 1 tablet by mouth at bedtime. Pure ZZZs sleep and destress   Yes [provider]  ?oxcarba

## 2021-06-16 ENCOUNTER — Emergency Department (HOSPITAL_COMMUNITY): Payer: No Typology Code available for payment source

## 2021-06-16 ENCOUNTER — Other Ambulatory Visit: Payer: Self-pay

## 2021-06-16 ENCOUNTER — Inpatient Hospital Stay (HOSPITAL_COMMUNITY)
Admission: EM | Admit: 2021-06-16 | Discharge: 2021-07-06 | DRG: 871 | Disposition: A | Discharge status: Home Health | Payer: No Typology Code available for payment source | Attending: Internal Medicine | Admitting: Internal Medicine

## 2021-06-16 ENCOUNTER — Encounter (HOSPITAL_COMMUNITY): Payer: Self-pay

## 2021-06-16 DIAGNOSIS — E1165 Type 2 diabetes mellitus with hyperglycemia: Secondary | ICD-10-CM | POA: Diagnosis not present

## 2021-06-16 DIAGNOSIS — R202 Paresthesia of skin: Secondary | ICD-10-CM

## 2021-06-16 DIAGNOSIS — Z7901 Long term (current) use of anticoagulants: Secondary | ICD-10-CM

## 2021-06-16 DIAGNOSIS — J9620 Acute and chronic respiratory failure, unspecified whether with hypoxia or hypercapnia: Secondary | ICD-10-CM | POA: Diagnosis present

## 2021-06-16 DIAGNOSIS — E875 Hyperkalemia: Secondary | ICD-10-CM | POA: Diagnosis present

## 2021-06-16 DIAGNOSIS — Z888 Allergy status to other drugs, medicaments and biological substances status: Secondary | ICD-10-CM

## 2021-06-16 DIAGNOSIS — E119 Type 2 diabetes mellitus without complications: Secondary | ICD-10-CM

## 2021-06-16 DIAGNOSIS — E222 Syndrome of inappropriate secretion of antidiuretic hormone: Secondary | ICD-10-CM | POA: Diagnosis present

## 2021-06-16 DIAGNOSIS — J189 Pneumonia, unspecified organism: Secondary | ICD-10-CM | POA: Diagnosis present

## 2021-06-16 DIAGNOSIS — Z820 Family history of epilepsy and other diseases of the nervous system: Secondary | ICD-10-CM

## 2021-06-16 DIAGNOSIS — Z923 Personal history of irradiation: Secondary | ICD-10-CM

## 2021-06-16 DIAGNOSIS — I509 Heart failure, unspecified: Secondary | ICD-10-CM

## 2021-06-16 DIAGNOSIS — F32A Depression, unspecified: Secondary | ICD-10-CM | POA: Diagnosis present

## 2021-06-16 DIAGNOSIS — K2091 Esophagitis, unspecified with bleeding: Secondary | ICD-10-CM | POA: Diagnosis not present

## 2021-06-16 DIAGNOSIS — R627 Adult failure to thrive: Secondary | ICD-10-CM | POA: Diagnosis present

## 2021-06-16 DIAGNOSIS — J432 Centrilobular emphysema: Secondary | ICD-10-CM | POA: Diagnosis present

## 2021-06-16 DIAGNOSIS — E6609 Other obesity due to excess calories: Secondary | ICD-10-CM

## 2021-06-16 DIAGNOSIS — C3492 Malignant neoplasm of unspecified part of left bronchus or lung: Secondary | ICD-10-CM | POA: Diagnosis present

## 2021-06-16 DIAGNOSIS — E871 Hypo-osmolality and hyponatremia: Secondary | ICD-10-CM | POA: Diagnosis present

## 2021-06-16 DIAGNOSIS — Z8701 Personal history of pneumonia (recurrent): Secondary | ICD-10-CM

## 2021-06-16 DIAGNOSIS — Z86711 Personal history of pulmonary embolism: Secondary | ICD-10-CM | POA: Diagnosis present

## 2021-06-16 DIAGNOSIS — Z7189 Other specified counseling: Secondary | ICD-10-CM

## 2021-06-16 DIAGNOSIS — R0602 Shortness of breath: Secondary | ICD-10-CM | POA: Diagnosis not present

## 2021-06-16 DIAGNOSIS — I808 Phlebitis and thrombophlebitis of other sites: Secondary | ICD-10-CM | POA: Diagnosis not present

## 2021-06-16 DIAGNOSIS — J9601 Acute respiratory failure with hypoxia: Secondary | ICD-10-CM

## 2021-06-16 DIAGNOSIS — Z85828 Personal history of other malignant neoplasm of skin: Secondary | ICD-10-CM

## 2021-06-16 DIAGNOSIS — J9621 Acute and chronic respiratory failure with hypoxia: Secondary | ICD-10-CM | POA: Diagnosis not present

## 2021-06-16 DIAGNOSIS — I252 Old myocardial infarction: Secondary | ICD-10-CM

## 2021-06-16 DIAGNOSIS — E1140 Type 2 diabetes mellitus with diabetic neuropathy, unspecified: Secondary | ICD-10-CM | POA: Diagnosis present

## 2021-06-16 DIAGNOSIS — J910 Malignant pleural effusion: Secondary | ICD-10-CM | POA: Diagnosis present

## 2021-06-16 DIAGNOSIS — F419 Anxiety disorder, unspecified: Secondary | ICD-10-CM | POA: Diagnosis present

## 2021-06-16 DIAGNOSIS — K254 Chronic or unspecified gastric ulcer with hemorrhage: Secondary | ICD-10-CM | POA: Diagnosis not present

## 2021-06-16 DIAGNOSIS — Z8489 Family history of other specified conditions: Secondary | ICD-10-CM

## 2021-06-16 DIAGNOSIS — J441 Chronic obstructive pulmonary disease with (acute) exacerbation: Secondary | ICD-10-CM

## 2021-06-16 DIAGNOSIS — E1101 Type 2 diabetes mellitus with hyperosmolarity with coma: Secondary | ICD-10-CM

## 2021-06-16 DIAGNOSIS — D509 Iron deficiency anemia, unspecified: Secondary | ICD-10-CM | POA: Diagnosis present

## 2021-06-16 DIAGNOSIS — Z87891 Personal history of nicotine dependence: Secondary | ICD-10-CM

## 2021-06-16 DIAGNOSIS — Z9981 Dependence on supplemental oxygen: Secondary | ICD-10-CM

## 2021-06-16 DIAGNOSIS — Z853 Personal history of malignant neoplasm of breast: Secondary | ICD-10-CM

## 2021-06-16 DIAGNOSIS — T8172XA Complication of vein following a procedure, not elsewhere classified, initial encounter: Secondary | ICD-10-CM | POA: Diagnosis not present

## 2021-06-16 DIAGNOSIS — D62 Acute posthemorrhagic anemia: Secondary | ICD-10-CM | POA: Diagnosis not present

## 2021-06-16 DIAGNOSIS — G4733 Obstructive sleep apnea (adult) (pediatric): Secondary | ICD-10-CM | POA: Diagnosis present

## 2021-06-16 DIAGNOSIS — A419 Sepsis, unspecified organism: Secondary | ICD-10-CM | POA: Diagnosis not present

## 2021-06-16 DIAGNOSIS — Z885 Allergy status to narcotic agent status: Secondary | ICD-10-CM

## 2021-06-16 DIAGNOSIS — Y828 Other medical devices associated with adverse incidents: Secondary | ICD-10-CM | POA: Diagnosis not present

## 2021-06-16 DIAGNOSIS — D75839 Thrombocytosis, unspecified: Secondary | ICD-10-CM | POA: Diagnosis not present

## 2021-06-16 DIAGNOSIS — R6520 Severe sepsis without septic shock: Secondary | ICD-10-CM

## 2021-06-16 DIAGNOSIS — Z6841 Body Mass Index (BMI) 40.0 and over, adult: Secondary | ICD-10-CM

## 2021-06-16 DIAGNOSIS — K5900 Constipation, unspecified: Secondary | ICD-10-CM | POA: Diagnosis not present

## 2021-06-16 DIAGNOSIS — I110 Hypertensive heart disease with heart failure: Secondary | ICD-10-CM | POA: Diagnosis present

## 2021-06-16 DIAGNOSIS — Z7982 Long term (current) use of aspirin: Secondary | ICD-10-CM

## 2021-06-16 DIAGNOSIS — Z7984 Long term (current) use of oral hypoglycemic drugs: Secondary | ICD-10-CM

## 2021-06-16 DIAGNOSIS — I5023 Acute on chronic systolic (congestive) heart failure: Secondary | ICD-10-CM | POA: Diagnosis not present

## 2021-06-16 DIAGNOSIS — Z79899 Other long term (current) drug therapy: Secondary | ICD-10-CM

## 2021-06-16 DIAGNOSIS — Z20822 Contact with and (suspected) exposure to covid-19: Secondary | ICD-10-CM | POA: Diagnosis present

## 2021-06-16 DIAGNOSIS — Y95 Nosocomial condition: Secondary | ICD-10-CM | POA: Diagnosis present

## 2021-06-16 DIAGNOSIS — Z7960 Long term (current) use of unspecified immunomodulators and immunosuppressants: Secondary | ICD-10-CM

## 2021-06-16 DIAGNOSIS — C3432 Malignant neoplasm of lower lobe, left bronchus or lung: Secondary | ICD-10-CM | POA: Diagnosis present

## 2021-06-16 DIAGNOSIS — M459 Ankylosing spondylitis of unspecified sites in spine: Secondary | ICD-10-CM | POA: Diagnosis present

## 2021-06-16 LAB — CBC
HCT: 43.2 % (ref 39.0–52.0)
Hemoglobin: 14.4 g/dL (ref 13.0–17.0)
MCH: 32.3 pg (ref 26.0–34.0)
MCHC: 33.3 g/dL (ref 30.0–36.0)
MCV: 96.9 fL (ref 80.0–100.0)
Platelets: 311 10*3/uL (ref 150–400)
RBC: 4.46 MIL/uL (ref 4.22–5.81)
RDW: 14 % (ref 11.5–15.5)
WBC: 13 10*3/uL — ABNORMAL HIGH (ref 4.0–10.5)
nRBC: 0 % (ref 0.0–0.2)

## 2021-06-16 LAB — I-STAT CHEM 8, ED
BUN: 16 mg/dL (ref 8–23)
Calcium, Ion: 1.15 mmol/L (ref 1.15–1.40)
Chloride: 102 mmol/L (ref 98–111)
Creatinine, Ser: 1 mg/dL (ref 0.61–1.24)
Glucose, Bld: 197 mg/dL — ABNORMAL HIGH (ref 70–99)
HCT: 43 % (ref 39.0–52.0)
Hemoglobin: 14.6 g/dL (ref 13.0–17.0)
Potassium: 4.2 mmol/L (ref 3.5–5.1)
Sodium: 136 mmol/L (ref 135–145)
TCO2: 25 mmol/L (ref 22–32)

## 2021-06-16 LAB — COMPREHENSIVE METABOLIC PANEL
ALT: 23 U/L (ref 0–44)
AST: 19 U/L (ref 15–41)
Albumin: 3.6 g/dL (ref 3.5–5.0)
Alkaline Phosphatase: 75 U/L (ref 38–126)
Anion gap: 9 (ref 5–15)
BUN: 18 mg/dL (ref 8–23)
CO2: 23 mmol/L (ref 22–32)
Calcium: 8.9 mg/dL (ref 8.9–10.3)
Chloride: 104 mmol/L (ref 98–111)
Creatinine, Ser: 1.12 mg/dL (ref 0.61–1.24)
GFR, Estimated: >60 mL/min (ref >60)
Glucose, Bld: 199 mg/dL — ABNORMAL HIGH (ref 70–99)
Potassium: 4.2 mmol/L (ref 3.5–5.1)
Sodium: 136 mmol/L (ref 135–145)
Total Bilirubin: 0.3 mg/dL (ref 0.3–1.2)
Total Protein: 6.7 g/dL (ref 6.5–8.1)

## 2021-06-16 LAB — BLOOD GAS, VENOUS
Acid-Base Excess: 2.1 mmol/L — ABNORMAL HIGH (ref 0.0–2.0)
Bicarbonate: 25.5 mmol/L (ref 20.0–28.0)
Drawn by: 54207
O2 Saturation: 95.5 %
Patient temperature: 37
pCO2, Ven: 35 mmHg — ABNORMAL LOW (ref 44–60)
pH, Ven: 7.47 — ABNORMAL HIGH (ref 7.25–7.43)
pO2, Ven: 67 mmHg — ABNORMAL HIGH (ref 32–45)

## 2021-06-16 LAB — TROPONIN I (HIGH SENSITIVITY): Troponin I (High Sensitivity): 9 ng/L (ref <18)

## 2021-06-16 LAB — LACTIC ACID, PLASMA: Lactic Acid, Venous: 2.6 mmol/L (ref 0.5–1.9)

## 2021-06-16 MED ORDER — TRAMADOL HCL 50 MG PO TABS
100.0000 mg | ORAL_TABLET | Freq: Once | ORAL | Status: AC
  Administered 2021-06-16: 100 mg via ORAL
  Filled 2021-06-16: qty 2

## 2021-06-16 MED ORDER — VANCOMYCIN HCL IN DEXTROSE 1-5 GM/200ML-% IV SOLN
1000.0000 mg | Freq: Once | INTRAVENOUS | Status: AC
  Administered 2021-06-16: 1000 mg via INTRAVENOUS
  Filled 2021-06-16: qty 200

## 2021-06-16 MED ORDER — METHYLPREDNISOLONE SODIUM SUCC 125 MG IJ SOLR
125.0000 mg | Freq: Once | INTRAMUSCULAR | Status: AC
Start: 2021-06-16 — End: 2021-06-16
  Administered 2021-06-16: 125 mg via INTRAVENOUS
  Filled 2021-06-16: qty 2

## 2021-06-16 MED ORDER — SODIUM CHLORIDE 0.9 % IV SOLN
2.0000 g | Freq: Once | INTRAVENOUS | Status: AC
  Administered 2021-06-16: 2 g via INTRAVENOUS
  Filled 2021-06-16: qty 12.5

## 2021-06-16 NOTE — ED Notes (Signed)
Unsuccessful attempt at Blood draw. ?

## 2021-06-16 NOTE — ED Provider Notes (Signed)
?[facility] DEPT ?Provider Note ? ? ?CSN: [id number] ?Arrival date & time: 06/16/21  1928 ? ?  ? ?History ? ?Chief Complaint  ?Patient presents with  ? Foot Pain  ? Shortness of Breath  ? ? ?Mario [name] is a 71 y.o. male hx of diabetes, lung cancer, recurrent pneumonia, COPD on 3 L nasal cannula at baseline, PE on Eliquis here presenting with shortness of breath and hypoxia.  Patient states that he was recently admitted for pneumonia.  He just finished a course of antibiotics for He was here 2 days ago for dizziness and his x-ray was stable.  Patient states that for the last 2 days, he noticed that his oxygen drops very frequently.  He states that whenever he ambulates on his 3 L, his oxygen will drop in the mid 80s.  He also states that he was taking a nap with his oxygen on and woke up and noticed his oxygen was 76%.  Finally, he was concerned that both his legs are colder than usual.  Denies any fevers.  Denies any purulent drainage from the leg ? ?The history is provided by the patient.  ? ?  ? ?Home Medications ?Prior to Admission medications   ?Medication Sig Start Date End Date Taking? Authorizing Provider  ?albuterol (PROVENTIL) (2.5 MG/3ML) 0.083% nebulizer solution Inhale 3 mLs (2.5 mg total) by nebulization every 4 (four) hours as needed for wheezing or shortness of breath. ?Patient taking differently: Take 2.5 mg by nebulization every 6 (six) hours as needed for wheezing or shortness of breath. 08/04/20 08/04/21  Lavina Hamman, MD  ?albuterol (VENTOLIN HFA) 108 (90 Base) MCG/ACT inhaler Inhale 2 puffs into the lungs every 6 (six) hours as needed for wheezing. 06/02/20   [provider]  ?apixaban (ELIQUIS) 5 MG TABS tablet Take 5 mg by mouth 2 (two) times daily.    [provider]  ?aspirin 81 MG EC tablet Take 81 mg by mouth daily.    [provider]  ?b complex vitamins capsule Take 1 capsule by mouth daily.    [provider]   ?bisacodyl (DULCOLAX) 5 MG EC tablet Take 1 tablet (5 mg total) by mouth daily at 12 noon. ?Patient taking differently: Take 5 mg by mouth daily. 08/04/20   Lavina Hamman, MD  ?buPROPion (WELLBUTRIN XL) 300 MG 24 hr tablet Take 300 mg by mouth daily. 02/05/20   [provider]  ?busPIRone (BUSPAR) 15 MG tablet Take 15 mg by mouth daily.    [provider]  ?calcium-vitamin D (OSCAL WITH D) 500-200 MG-UNIT tablet Take 1 tablet by mouth 2 (two) times daily with a meal.    [provider]  ?carboxymethylcellulose (REFRESH PLUS) 0.5 % SOLN Place 1 drop into both eyes 4 (four) times daily as needed (dry eyes).    [provider]  ?Cholecalciferol (VITAMIN D-3) 25 MCG (1000 UT) CAPS Take 1,000 Units by mouth 2 (two) times daily.    [provider]  ?clobetasol ointment (TEMOVATE) 2.99 % Apply 1 application. topically 2 (two) times daily as needed (for scars).    [provider]  ?clotrimazole-betamethasone (LOTRISONE) cream Apply topically 2 (two) times daily. ?Patient taking differently: Apply 1 application. topically 2 (two) times daily as needed (fungus). 05/21/21   Farrel Gordon, DO  ?cyclobenzaprine (FLEXERIL) 10 MG tablet Take 30 mg by mouth at bedtime.    [provider]  ?cycloSPORINE (RESTASIS) 0.05 % ophthalmic emulsion Place 1 drop into  both eyes 2 (two) times daily.    [provider]  ?diltiazem (CARDIZEM CD) 180 MG 24 hr capsule Take 180 mg by mouth daily.    [provider]  ?docusate sodium (COLACE) 100 MG capsule Take 2 capsules (200 mg total) by mouth 2 (two) times daily. ?Patient taking differently: Take 200 mg by mouth daily. 08/04/20   Lavina Hamman, MD  ?DULoxetine (CYMBALTA) 60 MG capsule Take 1 capsule (60 mg total) by mouth 2 (two) times daily. 07/21/17   Lavina Hamman, MD  ?feeding supplement (ENSURE ENLIVE / ENSURE PLUS) LIQD Take 237 mLs by mouth 3 (three) times daily between meals. ?Patient taking differently: Take  1 Bottle by mouth daily. 08/04/20   Lavina Hamman, MD  ?fluticasone Asencion Islam) 50 MCG/ACT nasal spray Place 2 sprays into both nostrils daily.     [provider]  ?folic acid (FOLVITE) 1 MG tablet Take 1 mg by mouth daily.    [provider]  ?gabapentin (NEURONTIN) 300 MG capsule Take 900 mg by mouth at bedtime.    [provider]  ?GARLIC OIL PO Take 1 capsule by mouth daily.    [provider]  ?guaiFENesin (MUCINEX) 600 MG 12 hr tablet Take 1 tablet (600 mg total) by mouth 2 (two) times daily. ?Patient taking differently: Take 600 mg by mouth 2 (two) times daily as needed for cough. 05/21/21   Farrel Gordon, DO  ?lisinopril (ZESTRIL) 20 MG tablet Take 20 mg by mouth daily.    [provider]  ?lurasidone (LATUDA) 40 MG TABS tablet Take 40 mg by mouth daily after supper.    [provider]  ?Melatonin 3 MG TABS Take 6 mg by mouth at bedtime.    [provider]  ?metFORMIN (GLUCOPHAGE) 500 MG tablet Take 500 mg by mouth 2 (two) times daily with a meal.    [provider]  ?methotrexate (RHEUMATREX) 2.5 MG tablet Take 15 mg by mouth once a week.    [provider]  ?Mometasone Furoate 200 MCG/ACT AERO Inhale 2 puffs into the lungs daily.    [provider]  ?mycophenolate (CELLCEPT) 250 MG capsule Take 1,500 mg by mouth 2 (two) times daily.    [provider]  ?nitroGLYCERIN (NITROSTAT) 0.4 MG SL tablet Place 1 tablet (0.4 mg total) under the tongue every 5 (five) minutes as needed for chest pain. ?Patient not taking: Reported on 06/14/2021 07/03/19   Edwin Dada, MD  ?Olodaterol HCl (STRIVERDI RESPIMAT) 2.5 MCG/ACT AERS Inhale 2 puffs into the lungs daily.    [provider]  ?OVER THE COUNTER MEDICATION Take 1 tablet by mouth at bedtime. Pure ZZZs sleep and destress    [provider]  ?oxcarbazepine (TRILEPTAL) 600 MG tablet Take 600-900 mg by mouth See admin instructions. Take 600mg  twice  daily and 900mg  at bedtime 07/16/20   [provider]  ?OXYGEN Inhale 3 L into the lungs at bedtime.    [provider]  ?Polyethyl Glycol-Propyl Glycol (SYSTANE OP) Place 1 drop into both eyes daily as needed (dry eye).    [provider]  ?polyethylene glycol (MIRALAX / GLYCOLAX) 17 g packet Take 17 g by mouth daily.    [provider]  ?PRESCRIPTION MEDICATION Inhale into the lungs See admin instructions. CPAP- At bedtime and during during any naps    [provider]  ?Skin Protectants, Misc. (EUCERIN) cream Apply 1 application. topically 2 (two) times daily as needed for  dry skin. ?Patient not taking: Reported on 06/14/2021    [provider]  ?sodium chloride (OCEAN) 0.65 % SOLN nasal spray Place 2 sprays into both nostrils 4 (four) times daily as needed for congestion.    [provider]  ?tamsulosin (FLOMAX) 0.4 MG CAPS capsule Take 0.4 mg by mouth daily.    [provider]  ?traMADol (ULTRAM) 50 MG tablet Take 100 mg by mouth at bedtime.    [provider]  ?traZODone (DESYREL) 50 MG tablet Take 75 mg by mouth at bedtime.    [provider]  ?VITAMIN A PO Take 1 tablet by mouth daily.    [provider]  ?vitamin C (ASCORBIC ACID) 500 MG tablet Take 500 mg by mouth 2 (two) times daily.    [provider]  ?   ? ?Allergies    ?Demerol [meperidine] and Zocor [simvastatin]   ? ?Review of Systems   ?Review of Systems  ?Respiratory:  Positive for shortness of breath.   ?All other systems reviewed and are negative. ? ?Physical Exam ?Updated Vital Signs ?BP (!) 156/86 (BP Location: Left Arm)   Pulse (!) 118   Temp 98.3 ?F (36.8 ?C) (Oral)   Resp 20   Ht 6\' 2"  (1.88 m)   Wt (!) 152.1 kg   SpO2 93%   BMI 43.05 kg/m?  ?Physical Exam ?Vitals and nursing note reviewed.  ?Constitutional:   ?   Comments: Chronically ill-appearing  ?HENT:  ?   Head: Normocephalic.  ?   Mouth/Throat:  ?   Mouth: Mucous membranes  are moist.  ?Eyes:  ?   Extraocular Movements: Extraocular movements intact.  ?   Pupils: Pupils are equal, round, and reactive to light.  ?Cardiovascular:  ?   Rate and Rhythm: Normal rate and regular rhythm.  ?Pulmona

## 2021-06-16 NOTE — ED Triage Notes (Signed)
Pt BIB EMS with c/o BL foot pain, pt also endorses SOB, 93% 3 L O2. Hx COPD ?

## 2021-06-16 NOTE — Progress Notes (Signed)
A consult was received from an ED physician for cefepime per pharmacy dosing.  The patient's profile has been reviewed for ht/wt/allergies/indication/available labs.   ?A one time order has been placed for cefepime 2g.  Further antibiotics/pharmacy consults should be ordered by admitting physician if indicated.       ?                ?Thank you, ?Peggyann Juba, PharmD, BCPS ?06/16/2021  9:10 PM ? ?

## 2021-06-16 NOTE — ED Provider Triage Note (Addendum)
Emergency Medicine Provider Triage Evaluation Note ? ?Mario [name] , a 71 y.o. male  was evaluated in triage.  Pt complains of bilateral foot pain and shortness of breath for several days. Hx of COPD, on chronic oxygen 3L nasal cannula. He states he only has the oxygen for exertion and sleeping, but checked his saturations today without oxygen and it was down to 76%. Initially had some chest discomfort but that has gone away. He also reports 3 days of foot pain and numbness, feeling as though his feet are cold. Hx of vasculitis of both legs.  ? ?Review of Systems  ?Positive: As above ?Negative: Injury, fever, cough ? ?Physical Exam  ?BP (!) 156/86 (BP Location: Left Arm)   Pulse (!) 118   Temp 98.3 ?F (36.8 ?C) (Oral)   Resp 20   Ht 6\' 2"  (1.88 m)   Wt (!) 152.1 kg   SpO2 93%   BMI 43.05 kg/m?  ?Gen:   Awake, no distress   ?Resp:  Normal effort  ?MSK:   Moves extremities without difficulty  ?Other:   ? ?Medical Decision Making  ?Medically screening exam initiated at 7:47 PM.  Appropriate orders placed.  [name] was informed that the remainder of the evaluation will be completed by another provider, this initial triage assessment does not replace that evaluation, and the importance of remaining in the ED until their evaluation is complete. ?  [name] ?06/16/21 1947 ? ?

## 2021-06-17 ENCOUNTER — Observation Stay (HOSPITAL_BASED_OUTPATIENT_CLINIC_OR_DEPARTMENT_OTHER): Payer: No Typology Code available for payment source

## 2021-06-17 DIAGNOSIS — R202 Paresthesia of skin: Secondary | ICD-10-CM | POA: Diagnosis not present

## 2021-06-17 DIAGNOSIS — J441 Chronic obstructive pulmonary disease with (acute) exacerbation: Secondary | ICD-10-CM

## 2021-06-17 DIAGNOSIS — D509 Iron deficiency anemia, unspecified: Secondary | ICD-10-CM | POA: Diagnosis present

## 2021-06-17 DIAGNOSIS — M459 Ankylosing spondylitis of unspecified sites in spine: Secondary | ICD-10-CM | POA: Diagnosis present

## 2021-06-17 DIAGNOSIS — K2970 Gastritis, unspecified, without bleeding: Secondary | ICD-10-CM | POA: Diagnosis not present

## 2021-06-17 DIAGNOSIS — G4733 Obstructive sleep apnea (adult) (pediatric): Secondary | ICD-10-CM | POA: Diagnosis present

## 2021-06-17 DIAGNOSIS — I110 Hypertensive heart disease with heart failure: Secondary | ICD-10-CM | POA: Diagnosis present

## 2021-06-17 DIAGNOSIS — R9431 Abnormal electrocardiogram [ECG] [EKG]: Secondary | ICD-10-CM | POA: Diagnosis not present

## 2021-06-17 DIAGNOSIS — E871 Hypo-osmolality and hyponatremia: Secondary | ICD-10-CM | POA: Diagnosis not present

## 2021-06-17 DIAGNOSIS — E1140 Type 2 diabetes mellitus with diabetic neuropathy, unspecified: Secondary | ICD-10-CM | POA: Diagnosis not present

## 2021-06-17 DIAGNOSIS — T8172XA Complication of vein following a procedure, not elsewhere classified, initial encounter: Secondary | ICD-10-CM | POA: Diagnosis not present

## 2021-06-17 DIAGNOSIS — A419 Sepsis, unspecified organism: Secondary | ICD-10-CM

## 2021-06-17 DIAGNOSIS — E222 Syndrome of inappropriate secretion of antidiuretic hormone: Secondary | ICD-10-CM | POA: Diagnosis not present

## 2021-06-17 DIAGNOSIS — Y95 Nosocomial condition: Secondary | ICD-10-CM | POA: Diagnosis present

## 2021-06-17 DIAGNOSIS — Z6841 Body Mass Index (BMI) 40.0 and over, adult: Secondary | ICD-10-CM | POA: Diagnosis not present

## 2021-06-17 DIAGNOSIS — R627 Adult failure to thrive: Secondary | ICD-10-CM | POA: Diagnosis present

## 2021-06-17 DIAGNOSIS — Y828 Other medical devices associated with adverse incidents: Secondary | ICD-10-CM | POA: Diagnosis not present

## 2021-06-17 DIAGNOSIS — Z7189 Other specified counseling: Secondary | ICD-10-CM | POA: Diagnosis not present

## 2021-06-17 DIAGNOSIS — Z86711 Personal history of pulmonary embolism: Secondary | ICD-10-CM | POA: Diagnosis not present

## 2021-06-17 DIAGNOSIS — E1101 Type 2 diabetes mellitus with hyperosmolarity with coma: Secondary | ICD-10-CM | POA: Diagnosis not present

## 2021-06-17 DIAGNOSIS — K209 Esophagitis, unspecified without bleeding: Secondary | ICD-10-CM | POA: Diagnosis not present

## 2021-06-17 DIAGNOSIS — K254 Chronic or unspecified gastric ulcer with hemorrhage: Secondary | ICD-10-CM | POA: Diagnosis not present

## 2021-06-17 DIAGNOSIS — J432 Centrilobular emphysema: Secondary | ICD-10-CM | POA: Diagnosis present

## 2021-06-17 DIAGNOSIS — K259 Gastric ulcer, unspecified as acute or chronic, without hemorrhage or perforation: Secondary | ICD-10-CM | POA: Diagnosis not present

## 2021-06-17 DIAGNOSIS — J189 Pneumonia, unspecified organism: Secondary | ICD-10-CM | POA: Diagnosis not present

## 2021-06-17 DIAGNOSIS — J910 Malignant pleural effusion: Secondary | ICD-10-CM | POA: Diagnosis present

## 2021-06-17 DIAGNOSIS — Z9981 Dependence on supplemental oxygen: Secondary | ICD-10-CM | POA: Diagnosis not present

## 2021-06-17 DIAGNOSIS — C3492 Malignant neoplasm of unspecified part of left bronchus or lung: Secondary | ICD-10-CM | POA: Diagnosis not present

## 2021-06-17 DIAGNOSIS — K2091 Esophagitis, unspecified with bleeding: Secondary | ICD-10-CM | POA: Diagnosis not present

## 2021-06-17 DIAGNOSIS — D62 Acute posthemorrhagic anemia: Secondary | ICD-10-CM | POA: Diagnosis not present

## 2021-06-17 DIAGNOSIS — I5023 Acute on chronic systolic (congestive) heart failure: Secondary | ICD-10-CM | POA: Diagnosis not present

## 2021-06-17 DIAGNOSIS — R0602 Shortness of breath: Secondary | ICD-10-CM | POA: Diagnosis present

## 2021-06-17 DIAGNOSIS — C3432 Malignant neoplasm of lower lobe, left bronchus or lung: Secondary | ICD-10-CM | POA: Diagnosis not present

## 2021-06-17 DIAGNOSIS — Z20822 Contact with and (suspected) exposure to covid-19: Secondary | ICD-10-CM | POA: Diagnosis not present

## 2021-06-17 DIAGNOSIS — J9621 Acute and chronic respiratory failure with hypoxia: Secondary | ICD-10-CM | POA: Diagnosis not present

## 2021-06-17 DIAGNOSIS — I808 Phlebitis and thrombophlebitis of other sites: Secondary | ICD-10-CM | POA: Diagnosis not present

## 2021-06-17 DIAGNOSIS — E6609 Other obesity due to excess calories: Secondary | ICD-10-CM

## 2021-06-17 DIAGNOSIS — F32A Depression, unspecified: Secondary | ICD-10-CM | POA: Diagnosis present

## 2021-06-17 LAB — CBC
HCT: 40.8 % (ref 39.0–52.0)
Hemoglobin: 13.6 g/dL (ref 13.0–17.0)
MCH: 32.5 pg (ref 26.0–34.0)
MCHC: 33.3 g/dL (ref 30.0–36.0)
MCV: 97.4 fL (ref 80.0–100.0)
Platelets: 268 10*3/uL (ref 150–400)
RBC: 4.19 MIL/uL — ABNORMAL LOW (ref 4.22–5.81)
RDW: 13.9 % (ref 11.5–15.5)
WBC: 11.3 10*3/uL — ABNORMAL HIGH (ref 4.0–10.5)
nRBC: 0 % (ref 0.0–0.2)

## 2021-06-17 LAB — CBG MONITORING, ED
Glucose-Capillary: 285 mg/dL — ABNORMAL HIGH (ref 70–99)
Glucose-Capillary: 337 mg/dL — ABNORMAL HIGH (ref 70–99)

## 2021-06-17 LAB — VITAMIN B12: Vitamin B-12: 1064 pg/mL — ABNORMAL HIGH (ref 180–914)

## 2021-06-17 LAB — BASIC METABOLIC PANEL
Anion gap: 8 (ref 5–15)
BUN: 18 mg/dL (ref 8–23)
CO2: 23 mmol/L (ref 22–32)
Calcium: 8.7 mg/dL — ABNORMAL LOW (ref 8.9–10.3)
Chloride: 103 mmol/L (ref 98–111)
Creatinine, Ser: 0.99 mg/dL (ref 0.61–1.24)
GFR, Estimated: >60 mL/min (ref >60)
Glucose, Bld: 309 mg/dL — ABNORMAL HIGH (ref 70–99)
Potassium: 4.9 mmol/L (ref 3.5–5.1)
Sodium: 134 mmol/L — ABNORMAL LOW (ref 135–145)

## 2021-06-17 LAB — STREP PNEUMONIAE URINARY ANTIGEN: Strep Pneumo Urinary Antigen: NEGATIVE

## 2021-06-17 LAB — GLUCOSE, CAPILLARY
Glucose-Capillary: 166 mg/dL — ABNORMAL HIGH (ref 70–99)
Glucose-Capillary: 222 mg/dL — ABNORMAL HIGH (ref 70–99)

## 2021-06-17 LAB — RESP PANEL BY RT-PCR (FLU A&B, COVID) ARPGX2
Influenza A by PCR: NEGATIVE
Influenza B by PCR: NEGATIVE
SARS Coronavirus 2 by RT PCR: NEGATIVE

## 2021-06-17 LAB — LACTIC ACID, PLASMA: Lactic Acid, Venous: 2.6 mmol/L (ref 0.5–1.9)

## 2021-06-17 LAB — FOLATE: Folate: 10.7 ng/mL (ref >5.9)

## 2021-06-17 MED ORDER — OXCARBAZEPINE 300 MG PO TABS
900.0000 mg | ORAL_TABLET | Freq: Every day | ORAL | Status: DC
  Administered 2021-06-17 – 2021-07-05 (×20): 900 mg via ORAL
  Filled 2021-06-17 (×20): qty 3

## 2021-06-17 MED ORDER — DILTIAZEM HCL ER COATED BEADS 180 MG PO CP24
180.0000 mg | ORAL_CAPSULE | Freq: Every day | ORAL | Status: DC
  Administered 2021-06-17 – 2021-07-06 (×20): 180 mg via ORAL
  Filled 2021-06-17 (×21): qty 1

## 2021-06-17 MED ORDER — FLUTICASONE PROPIONATE HFA 44 MCG/ACT IN AERO
2.0000 | INHALATION_SPRAY | Freq: Two times a day (BID) | RESPIRATORY_TRACT | Status: DC
  Administered 2021-06-17 – 2021-07-06 (×37): 2 via RESPIRATORY_TRACT
  Filled 2021-06-17: qty 10.6

## 2021-06-17 MED ORDER — APIXABAN 5 MG PO TABS
5.0000 mg | ORAL_TABLET | Freq: Two times a day (BID) | ORAL | Status: DC
  Administered 2021-06-17 – 2021-06-21 (×9): 5 mg via ORAL
  Filled 2021-06-17 (×9): qty 1

## 2021-06-17 MED ORDER — SODIUM CHLORIDE 0.9 % IV BOLUS
1000.0000 mL | Freq: Once | INTRAVENOUS | Status: AC
  Administered 2021-06-17: 1000 mL via INTRAVENOUS

## 2021-06-17 MED ORDER — MELATONIN 3 MG PO TABS
6.0000 mg | ORAL_TABLET | Freq: Every day | ORAL | Status: DC
  Administered 2021-06-17 – 2021-07-05 (×20): 6 mg via ORAL
  Filled 2021-06-17 (×20): qty 2

## 2021-06-17 MED ORDER — DULOXETINE HCL 30 MG PO CPEP
60.0000 mg | ORAL_CAPSULE | Freq: Two times a day (BID) | ORAL | Status: DC
  Administered 2021-06-17 – 2021-07-06 (×40): 60 mg via ORAL
  Filled 2021-06-17 (×40): qty 2

## 2021-06-17 MED ORDER — INSULIN ASPART 100 UNIT/ML IJ SOLN
0.0000 [IU] | Freq: Three times a day (TID) | INTRAMUSCULAR | Status: DC
  Administered 2021-06-17: 5 [IU] via SUBCUTANEOUS
  Administered 2021-06-17: 7 [IU] via SUBCUTANEOUS
  Administered 2021-06-17 – 2021-06-18 (×2): 2 [IU] via SUBCUTANEOUS
  Administered 2021-06-18: 3 [IU] via SUBCUTANEOUS
  Administered 2021-06-18: 2 [IU] via SUBCUTANEOUS
  Administered 2021-06-19 (×2): 1 [IU] via SUBCUTANEOUS
  Administered 2021-06-19: 2 [IU] via SUBCUTANEOUS
  Administered 2021-06-20 (×2): 1 [IU] via SUBCUTANEOUS
  Administered 2021-06-20: 2 [IU] via SUBCUTANEOUS
  Administered 2021-06-21: 3 [IU] via SUBCUTANEOUS
  Administered 2021-06-21: 1 [IU] via SUBCUTANEOUS
  Administered 2021-06-22: 2 [IU] via SUBCUTANEOUS
  Administered 2021-06-22 – 2021-06-23 (×2): 1 [IU] via SUBCUTANEOUS
  Administered 2021-06-23: 2 [IU] via SUBCUTANEOUS
  Administered 2021-06-23: 1 [IU] via SUBCUTANEOUS
  Administered 2021-06-24 – 2021-06-25 (×5): 2 [IU] via SUBCUTANEOUS
  Administered 2021-06-25: 5 [IU] via SUBCUTANEOUS
  Administered 2021-06-26 (×3): 3 [IU] via SUBCUTANEOUS
  Administered 2021-06-27 (×3): 2 [IU] via SUBCUTANEOUS
  Administered 2021-06-28: 3 [IU] via SUBCUTANEOUS
  Administered 2021-06-28 – 2021-06-30 (×8): 2 [IU] via SUBCUTANEOUS
  Administered 2021-07-01: 1 [IU] via SUBCUTANEOUS
  Administered 2021-07-01: 2 [IU] via SUBCUTANEOUS
  Administered 2021-07-01: 3 [IU] via SUBCUTANEOUS
  Administered 2021-07-02 (×2): 2 [IU] via SUBCUTANEOUS
  Administered 2021-07-02: 1 [IU] via SUBCUTANEOUS
  Administered 2021-07-03 (×3): 2 [IU] via SUBCUTANEOUS
  Administered 2021-07-04: 3 [IU] via SUBCUTANEOUS
  Administered 2021-07-04: 1 [IU] via SUBCUTANEOUS
  Administered 2021-07-04: 2 [IU] via SUBCUTANEOUS
  Administered 2021-07-05: 3 [IU] via SUBCUTANEOUS
  Administered 2021-07-05: 1 [IU] via SUBCUTANEOUS
  Administered 2021-07-05 – 2021-07-06 (×2): 2 [IU] via SUBCUTANEOUS
  Filled 2021-06-17: qty 0.09

## 2021-06-17 MED ORDER — ALBUTEROL SULFATE (2.5 MG/3ML) 0.083% IN NEBU: 2.5000 mg | INHALATION_SOLUTION | Freq: Four times a day (QID) | RESPIRATORY_TRACT | Status: DC | PRN

## 2021-06-17 MED ORDER — FOLIC ACID 1 MG PO TABS
1.0000 mg | ORAL_TABLET | Freq: Every day | ORAL | Status: DC
  Administered 2021-06-17 – 2021-07-06 (×20): 1 mg via ORAL
  Filled 2021-06-17 (×20): qty 1

## 2021-06-17 MED ORDER — TAMSULOSIN HCL 0.4 MG PO CAPS
0.4000 mg | ORAL_CAPSULE | Freq: Every day | ORAL | Status: DC
  Administered 2021-06-17 – 2021-07-06 (×20): 0.4 mg via ORAL
  Filled 2021-06-17 (×20): qty 1

## 2021-06-17 MED ORDER — SALINE SPRAY 0.65 % NA SOLN
1.0000 | NASAL | Status: DC | PRN
  Filled 2021-06-17 (×2): qty 44

## 2021-06-17 MED ORDER — LURASIDONE HCL 40 MG PO TABS
40.0000 mg | ORAL_TABLET | Freq: Every day | ORAL | Status: DC
  Administered 2021-06-18 – 2021-07-05 (×18): 40 mg via ORAL
  Filled 2021-06-17 (×19): qty 1

## 2021-06-17 MED ORDER — MYCOPHENOLATE MOFETIL 250 MG PO CAPS
1500.0000 mg | ORAL_CAPSULE | Freq: Two times a day (BID) | ORAL | Status: DC
  Administered 2021-06-17 – 2021-07-06 (×40): 1500 mg via ORAL
  Filled 2021-06-17 (×40): qty 6

## 2021-06-17 MED ORDER — ASPIRIN EC 81 MG PO TBEC
81.0000 mg | DELAYED_RELEASE_TABLET | Freq: Every day | ORAL | Status: DC
  Administered 2021-06-17 – 2021-07-06 (×20): 81 mg via ORAL
  Filled 2021-06-17 (×20): qty 1

## 2021-06-17 MED ORDER — SODIUM CHLORIDE 0.9 % IV SOLN
2.0000 g | Freq: Three times a day (TID) | INTRAVENOUS | Status: AC
  Administered 2021-06-17 – 2021-06-23 (×21): 2 g via INTRAVENOUS
  Filled 2021-06-17 (×21): qty 12.5

## 2021-06-17 MED ORDER — OXCARBAZEPINE 300 MG PO TABS
600.0000 mg | ORAL_TABLET | Freq: Two times a day (BID) | ORAL | Status: DC
  Administered 2021-06-17: 600 mg via ORAL
  Administered 2021-06-17: 300 mg via ORAL
  Administered 2021-06-18 – 2021-07-06 (×37): 600 mg via ORAL
  Filled 2021-06-17 (×40): qty 2

## 2021-06-17 MED ORDER — BUPROPION HCL ER (XL) 300 MG PO TB24
300.0000 mg | ORAL_TABLET | Freq: Every day | ORAL | Status: DC
  Administered 2021-06-17 – 2021-07-06 (×20): 300 mg via ORAL
  Filled 2021-06-17 (×4): qty 1
  Filled 2021-06-17: qty 2
  Filled 2021-06-17 (×15): qty 1

## 2021-06-17 MED ORDER — CYCLOBENZAPRINE HCL 10 MG PO TABS
30.0000 mg | ORAL_TABLET | Freq: Every day | ORAL | Status: DC
Start: 2021-06-17 — End: 2021-06-22
  Administered 2021-06-17 – 2021-06-21 (×6): 30 mg via ORAL
  Filled 2021-06-17 (×6): qty 3

## 2021-06-17 MED ORDER — LISINOPRIL 20 MG PO TABS
20.0000 mg | ORAL_TABLET | Freq: Every day | ORAL | Status: DC
  Administered 2021-06-17 – 2021-06-22 (×6): 20 mg via ORAL
  Filled 2021-06-17 (×4): qty 1
  Filled 2021-06-17: qty 2
  Filled 2021-06-17: qty 1

## 2021-06-17 MED ORDER — TRAMADOL HCL 50 MG PO TABS
100.0000 mg | ORAL_TABLET | Freq: Every day | ORAL | Status: DC
  Administered 2021-06-17 – 2021-07-05 (×17): 100 mg via ORAL
  Filled 2021-06-17 (×20): qty 2

## 2021-06-17 MED ORDER — ENOXAPARIN SODIUM 40 MG/0.4ML IJ SOSY: 40.0000 mg | PREFILLED_SYRINGE | INTRAMUSCULAR | Status: DC

## 2021-06-17 MED ORDER — GABAPENTIN 300 MG PO CAPS
900.0000 mg | ORAL_CAPSULE | Freq: Every day | ORAL | Status: DC
  Administered 2021-06-17 – 2021-07-05 (×20): 900 mg via ORAL
  Filled 2021-06-17 (×20): qty 3

## 2021-06-17 MED ORDER — POLYETHYLENE GLYCOL 3350 17 G PO PACK
17.0000 g | PACK | Freq: Every day | ORAL | Status: DC
  Administered 2021-06-17 – 2021-06-23 (×7): 17 g via ORAL
  Filled 2021-06-17 (×8): qty 1

## 2021-06-17 MED ORDER — DOCUSATE SODIUM 100 MG PO CAPS
200.0000 mg | ORAL_CAPSULE | Freq: Every day | ORAL | Status: DC
  Administered 2021-06-17 – 2021-06-23 (×7): 200 mg via ORAL
  Filled 2021-06-17 (×8): qty 2

## 2021-06-17 MED ORDER — BUSPIRONE HCL 5 MG PO TABS
15.0000 mg | ORAL_TABLET | Freq: Every day | ORAL | Status: DC
  Administered 2021-06-17 – 2021-07-06 (×20): 15 mg via ORAL
  Filled 2021-06-17 (×3): qty 1
  Filled 2021-06-17: qty 2
  Filled 2021-06-17 (×16): qty 1

## 2021-06-17 MED ORDER — LIP MEDEX EX OINT
1.0000 "application " | TOPICAL_OINTMENT | CUTANEOUS | Status: DC | PRN
  Administered 2021-06-19: 1 via TOPICAL
  Filled 2021-06-17: qty 7

## 2021-06-17 MED ORDER — TRAZODONE HCL 50 MG PO TABS
75.0000 mg | ORAL_TABLET | Freq: Every day | ORAL | Status: DC
Start: 2021-06-17 — End: 2021-07-06
  Administered 2021-06-17 – 2021-07-05 (×20): 75 mg via ORAL
  Filled 2021-06-17 (×20): qty 2

## 2021-06-17 MED ORDER — ARFORMOTEROL TARTRATE 15 MCG/2ML IN NEBU
15.0000 ug | INHALATION_SOLUTION | Freq: Two times a day (BID) | RESPIRATORY_TRACT | Status: DC
Start: 2021-06-17 — End: 2021-07-06
  Administered 2021-06-17 – 2021-07-06 (×35): 15 ug via RESPIRATORY_TRACT
  Filled 2021-06-17 (×40): qty 2

## 2021-06-17 MED ORDER — MOMETASONE FUROATE 200 MCG/ACT IN AERO: 2.0000 | INHALATION_SPRAY | Freq: Every day | RESPIRATORY_TRACT | Status: DC

## 2021-06-17 MED ORDER — ACETAMINOPHEN 325 MG PO TABS
650.0000 mg | ORAL_TABLET | Freq: Four times a day (QID) | ORAL | Status: DC | PRN
  Administered 2021-06-17 – 2021-06-25 (×3): 650 mg via ORAL
  Filled 2021-06-17 (×5): qty 2

## 2021-06-17 NOTE — Progress Notes (Signed)
ABI's have been completed. ?Preliminary results can be found in CV Proc through chart review.  ? ?06/17/21 10:23 AM ?Carlos Levering RVT   ?

## 2021-06-17 NOTE — Assessment & Plan Note (Signed)
S/p SBRT  ?Currently on surveillance with oncology Dr. Earlie Server ? ?

## 2021-06-17 NOTE — Progress Notes (Signed)
?  Progress Note ? ? ?Patient: Mario [name] JQG:[id number] DOB: [birth year] DOA: 06/16/2021     0 ?DOS: the patient was seen and examined on 06/17/2021 ?  ?Brief hospital course: ?71 y.o. 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 ? ?He was given IV vancomycin and cefepime with Solu-Medrol in the ED.  Hospitalist called for admission for management of left lower lobe pneumonia seen on imaging. ? ?Assessment and Plan: ?Acute on chronic respiratory failure ([place]) ?Baseline on 3L with reported hypoxia to 76% at home. CXR findings of Left lower lobe pneumonia ?-continue IV cefepime. MRSA PCR negative recently ?-test for strep and Legionella urine antigen ?-Flutter valve and incentive spirometry ? ?Sepsis ([place]) ?Presented with tachycardia, leukocytosis with chest x-ray finding of pneumonia ?-cont on above abx ? ?Leg paresthesia ?Reports acute bilateral feet paresthesia for past 3 days. strong dorsalis pedis pulses heard on bedside doppler.  ?-ABI's reviewed, findings of mild RLE arterial disease ?-Vitamin R97 5883, folic acid 25.4  ?-DIY6E suggests good control at 7.6  ? ?Obesity, Class III, BMI 40-49.9 (morbid obesity) ([place]) ?BMI 43 ?Recommend diet/lifestyle modification ? ?History of pulmonary embolism ?Continue Eliquis ? ?Non-small cell cancer of left lung ([place]) ?S/p SBRT  ?Currently on surveillance with oncology Dr. Earlie Server ? ? ?OSA (obstructive sleep apnea) ?Declined hospital CPAP ? ?DM2 (diabetes mellitus, type 2) ([place]) ?Hemoglobin A1c of 7.6 in March ?Placed on sensitive SSI ? ? ? ? ?  ? ?Subjective: Reports breathing better today. Complains of LE numbness ? ?Physical Exam: ?Vitals:  ? 06/17/21 1530 06/17/21 1600 06/17/21 1630 06/17/21 1637  ?BP: 134/84 118/83 135/88 135/88   ?Pulse: 96 99 94 (!) 103  ?Resp:    18  ?Temp:    97.8 ?F (36.6 ?C)  ?TempSrc:    Oral  ?SpO2: 94% 93% 94% 95%  ?Weight:      ?Height:      ? ?General exam: Awake, laying in bed, in nad ?Respiratory system: Normal respiratory effort, no wheezing ?Cardiovascular system: regular rate, s1, s2 ?Gastrointestinal system: Soft, nondistended, positive BS ?Central nervous system: CN2-12 grossly intact, strength intact ?Extremities: Perfused, no clubbing ?Skin: Normal skin turgor, no notable skin lesions seen ?Psychiatry: Mood normal // no visual hallucinations  ? ?Data Reviewed: ? ?Labs reviewed: Na 134, Cr 0.99 ? ?Family Communication: Pt in room, family not at bedside ? ?Disposition: ?Status is: Observation ?The patient will require care spanning > 2 midnights and should be moved to inpatient because: Severity of illness ? Planned Discharge Destination: Home ? ? ? ?Author: ?Marylu Lund, MD ?06/17/2021 6:20 PM ? ?For on call review [email or web address].  ?

## 2021-06-17 NOTE — Assessment & Plan Note (Addendum)
Hemoglobin A1c of 7.6 in March ?Glycemic trends stable ?Cont SSI ?

## 2021-06-17 NOTE — Assessment & Plan Note (Addendum)
Baseline on 3L with reported hypoxia to 76% at home. CXR findings of Left lower lobe pneumonia ?-continue IV cefepime. MRSA PCR negative recently ?-Flutter valve and incentive spirometry ?-Continues with increased dyspnea on even minimal exertion ?-Repeat CXR reviewed, findings of persistent opacity in L lower lung fields, essentially unchanged ?-Consulted Pulmonary. Recs for bedside thoracentesis 4/26 ?-Oncology following, recommending sending fluid for cytology as well ?

## 2021-06-17 NOTE — H&P (Signed)
?History and Physical  ? ? ?Patient: Mario [name] PZW:[id number] DOB: 05-21-1950 ?DOA: 06/16/2021 ?DOS: the patient was seen and examined on 06/17/2021 ?PCP: [facility]  ?Patient coming from: Home ? ?Chief Complaint:  ?Chief Complaint  ?Patient presents with  ? Foot Pain  ? Shortness of Breath  ? ?HPI: Mario [name] is a 71 y.o. 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 ? ?Patient reports that for the past 3 days he has noticed numbness of both feet and has been feeling cold.  Feels like he is "walking on balloons."  Denies any pain with ambulation.  Today he is also noticed hypoxia down to 76% at home after taking a nap.  Has been feeling dyspnea upon exertion with a mild nonproductive cough.  Denies ongoing tobacco use.  He takes Eliquis for PE and reports compliance. ? ?In the ED, he was afebrile mildly tachycardic up to 118 and at his baseline 3 L via nasal cannula.  Has leukocytosis of 13 with lactate of 2.6.  Normal hemoglobin of 14.  BMP with elevated glucose 199 but otherwise unremarkable.  pH of 7.47, CO2 of 35.  Chest x-ray shows patchy infiltrate in the left lower lung field suggesting pneumonia.  This is also in the same location of his known malignancy. ? ?He was recently just discharged with pneumonia about 2 weeks ago and was treated with cefepime and reports improvement in his symptoms.  Since he had recent CTA chest on 4/7 that was negative for pulmonary embolism, repeat was not obtained in the ED. ? ?He was given IV vancomycin and cefepime with Solu-Medrol in the ED.  Hospitalist called for admission for management of left lower lobe pneumonia. ? ?Review of Systems: As mentioned in the history of present illness. All other systems reviewed  and are negative. ?Past Medical History:  ?Diagnosis Date  ? Anxiety   ? Bronchitis   ? COPD (chronic obstructive pulmonary disease) ([place])   ? Depression   ? History of radiation therapy   ? Left lung- 10/05/20-10/15/20- Dr. Gery Pray  ? Hypertension   ? lung ca 09/2020  ? Mental disorder   ? MI (myocardial infarction) ([place])   ? ????  ? OSA (obstructive sleep apnea)   ? Suicide attempt [facility])   ? Tension pneumothorax 06/27/2016  ? Uveitis   ? ?Past Surgical History:  ?Procedure Laterality Date  ? BRONCHIAL BIOPSY  07/30/2020  ? Procedure: BRONCHIAL BIOPSIES;  Surgeon: Garner Nash, DO;  Location: [facility];  Service: Pulmonary;;  ? BRONCHIAL BRUSHINGS  07/30/2020  ? Procedure: BRONCHIAL BRUSHINGS;  Surgeon: Garner Nash, DO;  Location: [facility];  Service: Pulmonary;;  ? BRONCHIAL NEEDLE ASPIRATION BIOPSY  07/30/2020  ? Procedure: BRONCHIAL NEEDLE ASPIRATION BIOPSIES;  Surgeon: Garner Nash, DO;  Location: [facility];  Service: Pulmonary;;  ? BRONCHIAL WASHINGS  07/30/2020  ? Procedure: BRONCHIAL WASHINGS;  Surgeon: Garner Nash, DO;  Location: [facility];  Service: Pulmonary;;  ? CHEST TUBE INSERTION Left 06/27/2016  ? cryptorchidism    ? SKIN CANCER EXCISION    ? VIDEO BRONCHOSCOPY WITH ENDOBRONCHIAL NAVIGATION Left 07/30/2020  ? Procedure: VIDEO BRONCHOSCOPY WITH ENDOBRONCHIAL NAVIGATION;  Surgeon: Garner Nash, DO;  Location: [facility];  Service: Pulmonary;  Laterality: Left;  ? ?  Social History:  reports that he quit smoking about 5 years ago. His smoking use included cigarettes. He has a 35.00 pack-year smoking history. He has never used smokeless tobacco. He reports that he does not drink alcohol and does not use drugs. ? ?Allergies  ?Allergen Reactions  ? Demerol [Meperidine] Nausea And Vomiting and Other (See Comments)  ?  Made the patient "violently sick"  ? Zocor [Simvastatin] Nausea And Vomiting and Other (See Comments)  ?  Made him very jittery, also  ? ? ?Family History   ?Problem Relation Age of Onset  ? Dementia Father   ? ? ?Prior to Admission medications   ?Medication Sig Start Date End Date Taking? Authorizing Provider  ?albuterol (PROVENTIL) (2.5 MG/3ML) 0.083% nebulizer solution Inhale 3 mLs (2.5 mg total) by nebulization every 4 (four) hours as needed for wheezing or shortness of breath. ?Patient taking differently: Take 2.5 mg by nebulization every 6 (six) hours as needed for wheezing or shortness of breath. 08/04/20 08/04/21 Yes Lavina Hamman, MD  ?albuterol (VENTOLIN HFA) 108 (90 Base) MCG/ACT inhaler Inhale 2 puffs into the lungs every 6 (six) hours as needed for wheezing. 06/02/20  Yes [provider]  ?apixaban (ELIQUIS) 5 MG TABS tablet Take 5 mg by mouth 2 (two) times daily.   Yes [provider]  ?aspirin 81 MG EC tablet Take 81 mg by mouth daily.   Yes [provider]  ?b complex vitamins capsule Take 1 capsule by mouth daily.   Yes [provider]  ?buPROPion (WELLBUTRIN XL) 300 MG 24 hr tablet Take 300 mg by mouth daily. 02/05/20  Yes [provider]  ?busPIRone (BUSPAR) 15 MG tablet Take 15 mg by mouth daily.   Yes [provider]  ?calcium-vitamin D (OSCAL WITH D) 500-200 MG-UNIT tablet Take 1 tablet by mouth 2 (two) times daily with a meal.   Yes [provider]  ?carboxymethylcellulose (REFRESH PLUS) 0.5 % SOLN Place 1 drop into both eyes 4 (four) times daily as needed (dry eyes).   Yes [provider]  ?Cholecalciferol (VITAMIN D-3) 25 MCG (1000 UT) CAPS Take 1,000 Units by mouth 2 (two) times daily.   Yes [provider]  ?clobetasol ointment (TEMOVATE) 0.93 % Apply 1 application. topically 2 (two) times daily as needed (for scars).   Yes [provider]  ?clotrimazole-betamethasone (LOTRISONE) cream Apply topically 2 (two) times daily. ?Patient taking differently: Apply 1 application. topically 2 (two) times daily as needed (fungus). 05/21/21  Yes Farrel Gordon, DO   ?cyclobenzaprine (FLEXERIL) 10 MG tablet Take 30 mg by mouth at bedtime.   Yes [provider]  ?cycloSPORINE (RESTASIS) 0.05 % ophthalmic emulsion Place 1 drop into both eyes 2 (two) times daily.   Yes [provider]  ?diltiazem (CARDIZEM CD) 180 MG 24 hr capsule Take 180 mg by mouth daily.   Yes [provider]  ?docusate sodium (COLACE) 100 MG capsule Take 2 capsules (200 mg total) by mouth 2 (two) times daily. ?Patient taking differently: Take 200 mg by mouth daily. 08/04/20  Yes Lavina Hamman, MD  ?DULoxetine (CYMBALTA) 60 MG capsule Take 1 capsule (60 mg total) by mouth 2 (two) times daily. 07/21/17  Yes Lavina Hamman, MD  ?feeding supplement (ENSURE ENLIVE / ENSURE PLUS) LIQD Take 237 mLs by mouth 3 (three) times daily between meals. ?Patient taking differently: Take 1 Bottle by mouth daily. 08/04/20  Yes Lavina Hamman, MD  ?fluticasone Asencion Islam) 50 MCG/ACT nasal spray  Place 2 sprays into both nostrils daily.    Yes [provider]  ?folic acid (FOLVITE) 1 MG tablet Take 1 mg by mouth daily.   Yes [provider]  ?gabapentin (NEURONTIN) 300 MG capsule Take 900 mg by mouth at bedtime.   Yes [provider]  ?GARLIC OIL PO Take 1 capsule by mouth daily.   Yes [provider]  ?guaiFENesin (MUCINEX) 600 MG 12 hr tablet Take 1 tablet (600 mg total) by mouth 2 (two) times daily. ?Patient taking differently: Take 600 mg by mouth 2 (two) times daily as needed for cough. 05/21/21  Yes Farrel Gordon, DO  ?lisinopril (ZESTRIL) 20 MG tablet Take 20 mg by mouth daily.   Yes [provider]  ?lurasidone (LATUDA) 40 MG TABS tablet Take 40 mg by mouth daily after supper.   Yes [provider]  ?Melatonin 3 MG TABS Take 6 mg by mouth at bedtime.   Yes [provider]  ?metFORMIN (GLUCOPHAGE) 500 MG tablet Take 500 mg by mouth 2 (two) times daily with a meal.   Yes [provider]  ?methotrexate (RHEUMATREX) 2.5 MG tablet Take  15 mg by mouth once a week.   Yes [provider]  ?Mometasone Furoate 200 MCG/ACT AERO Inhale 2 puffs into the lungs at bedtime.   Yes [provider]  ?mycophenolate (CELLCEPT) 250 MG capsule Take 1,5

## 2021-06-17 NOTE — Assessment & Plan Note (Addendum)
Reports acute bilateral feet paresthesia for past 3 days prior to admit. Strong dorsalis pedis pulses heard on bedside doppler.  ?-Numbness noted primarily at soles of B feet.  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

## 2021-06-17 NOTE — Assessment & Plan Note (Addendum)
On eliquis at home, on hold now in anticipation for upcoming thoracentesis on 4/26 ?No evidence of bleed ?Would resume eliquis when OK with Pulmonary ?

## 2021-06-17 NOTE — Assessment & Plan Note (Addendum)
BMI 43 ?Recommend diet/lifestyle modification ?

## 2021-06-17 NOTE — Progress Notes (Signed)
Pt refused cpap tonight. 

## 2021-06-17 NOTE — ED Notes (Signed)
3 urinals containing 450, 300, 200 ml urine emptied. ?

## 2021-06-17 NOTE — Hospital Course (Signed)
71 y.o. 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 ? ?He was given IV vancomycin and cefepime with Solu-Medrol in the ED.  Hospitalist called for admission for management of left lower lobe pneumonia seen on imaging. ?

## 2021-06-17 NOTE — Assessment & Plan Note (Addendum)
Presented with tachycardia, leukocytosis with chest x-ray finding of pneumonia ?-cont on above abx ?

## 2021-06-17 NOTE — Assessment & Plan Note (Signed)
Declined hospital CPAP ?

## 2021-06-17 NOTE — Progress Notes (Signed)
Pharmacy Antibiotic Note ? ?Mario [name] is a 71 y.o. male admitted on 06/16/2021 with shortness of breath and hypoxia.  Pt has a  hx of diabetes, lung cancer, recurrent pneumonia, COPD on 3 L nasal cannula at baseline, PE on Eliquis and recently admitted for pna. Pharmacy has been consulted to dose cefepime for HCAP. ? ?Plan: ?Cefepime 2gm IV q8h ?Follow renal function and clinical course ? ?Height: 6\' 2"  (188 cm) ?Weight: (!) 152.1 kg (335 lb 5.1 oz) ?IBW/kg (Calculated) : 82.2 ? ?Temp (24hrs), Avg:98.3 ?F (36.8 ?C), Min:98.3 ?F (36.8 ?C), Max:98.3 ?F (36.8 ?C) ? ?Recent Labs  ?Lab 06/14/21 ?4332 06/16/21 ?2125 06/16/21 ?2149  ?WBC 11.5* 13.0*  --   ?CREATININE 0.96 1.12 1.00  ?LATICACIDVEN  --  2.6*  --   ?  ?Estimated Creatinine Clearance: 108.7 mL/min (by C-G formula based on SCr of 1 mg/dL).   ? ?Allergies  ?Allergen Reactions  ? Demerol [Meperidine] Nausea And Vomiting and Other (See Comments)  ?  Made the patient "violently sick"  ? Zocor [Simvastatin] Nausea And Vomiting and Other (See Comments)  ?  Made him very jittery, also  ? ? ?Antimicrobials this admission: ?Cefepime 4/19 >> ?Vanc 4/19 x 1 ? ?Dose adjustments this admission: ? ? ?Microbiology results: ?4/19 BCx:  ? ?Thank you for allowing pharmacy to be a part of this patient?s care. ? ?Mario [name] RPh ?06/17/2021, 1:06 AM ? ?

## 2021-06-18 DIAGNOSIS — J189 Pneumonia, unspecified organism: Secondary | ICD-10-CM | POA: Diagnosis not present

## 2021-06-18 DIAGNOSIS — J441 Chronic obstructive pulmonary disease with (acute) exacerbation: Secondary | ICD-10-CM | POA: Diagnosis not present

## 2021-06-18 DIAGNOSIS — Z86711 Personal history of pulmonary embolism: Secondary | ICD-10-CM | POA: Diagnosis not present

## 2021-06-18 LAB — COMPREHENSIVE METABOLIC PANEL
ALT: 20 U/L (ref 0–44)
AST: 15 U/L (ref 15–41)
Albumin: 3.3 g/dL — ABNORMAL LOW (ref 3.5–5.0)
Alkaline Phosphatase: 62 U/L (ref 38–126)
Anion gap: 8 (ref 5–15)
BUN: 16 mg/dL (ref 8–23)
CO2: 26 mmol/L (ref 22–32)
Calcium: 8.7 mg/dL — ABNORMAL LOW (ref 8.9–10.3)
Chloride: 102 mmol/L (ref 98–111)
Creatinine, Ser: 0.89 mg/dL (ref 0.61–1.24)
GFR, Estimated: >60 mL/min (ref >60)
Glucose, Bld: 183 mg/dL — ABNORMAL HIGH (ref 70–99)
Potassium: 4.2 mmol/L (ref 3.5–5.1)
Sodium: 136 mmol/L (ref 135–145)
Total Bilirubin: 0.3 mg/dL (ref 0.3–1.2)
Total Protein: 6.2 g/dL — ABNORMAL LOW (ref 6.5–8.1)

## 2021-06-18 LAB — LEGIONELLA PNEUMOPHILA SEROGP 1 UR AG: L. pneumophila Serogp 1 Ur Ag: NEGATIVE

## 2021-06-18 LAB — CBC
HCT: 41.6 % (ref 39.0–52.0)
Hemoglobin: 13.5 g/dL (ref 13.0–17.0)
MCH: 32.2 pg (ref 26.0–34.0)
MCHC: 32.5 g/dL (ref 30.0–36.0)
MCV: 99.3 fL (ref 80.0–100.0)
Platelets: 235 10*3/uL (ref 150–400)
RBC: 4.19 MIL/uL — ABNORMAL LOW (ref 4.22–5.81)
RDW: 13.9 % (ref 11.5–15.5)
WBC: 11.9 10*3/uL — ABNORMAL HIGH (ref 4.0–10.5)
nRBC: 0 % (ref 0.0–0.2)

## 2021-06-18 LAB — GLUCOSE, CAPILLARY
Glucose-Capillary: 201 mg/dL — ABNORMAL HIGH (ref 70–99)
Glucose-Capillary: 200 mg/dL — ABNORMAL HIGH (ref 70–99)
Glucose-Capillary: 167 mg/dL — ABNORMAL HIGH (ref 70–99)
Glucose-Capillary: 135 mg/dL — ABNORMAL HIGH (ref 70–99)

## 2021-06-18 MED ORDER — KETOROLAC TROMETHAMINE 15 MG/ML IJ SOLN
15.0000 mg | Freq: Four times a day (QID) | INTRAMUSCULAR | Status: AC | PRN
  Administered 2021-06-18 – 2021-06-21 (×6): 15 mg via INTRAVENOUS
  Filled 2021-06-18 (×8): qty 1

## 2021-06-18 NOTE — Plan of Care (Signed)
?  Problem: Education: ?Goal: Knowledge of General Education information will improve ?Description: Including pain rating scale, medication(s)/side effects and non-pharmacologic comfort measures ?Outcome: Progressing ?  ?Problem: Nutrition: ?Goal: Adequate nutrition will be maintained ?Outcome: Progressing ?  ?Problem: Activity: ?Goal: Risk for activity intolerance will decrease ?Outcome: Progressing ?  ?Problem: Coping: ?Goal: Level of anxiety will decrease ?Outcome: Progressing ?  ?Problem: Pain Managment: ?Goal: General experience of comfort will improve ?Outcome: Progressing ?  ?Problem: Safety: ?Goal: Ability to remain free from injury will improve ?Outcome: Progressing ?  ?Problem: Activity: ?Goal: Ability to tolerate increased activity will improve ?Outcome: Progressing ?  ?

## 2021-06-18 NOTE — Progress Notes (Signed)
?  Progress Note ? ? ?Patient: Mario [name] MVV:[id number] DOB: [birth year] DOA: 06/16/2021     1 ?DOS: the patient was seen and examined on 06/18/2021 ?  ?Brief hospital course: ?71 y.o. 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 ? ?He was given IV vancomycin and cefepime with Solu-Medrol in the ED.  Hospitalist called for admission for management of left lower lobe pneumonia seen on imaging. ? ?Assessment and Plan: ?Acute on chronic respiratory failure ([place]) ?Baseline on 3L with reported hypoxia to 76% at home. CXR findings of Left lower lobe pneumonia ?-continue IV cefepime. MRSA PCR negative recently ?-test for strep and Legionella urine antigen ?-Flutter valve and incentive spirometry ?-Now on baseline O2 requirements at RA on rest and 3L on ambulation ? ?Sepsis ([place]) ?Presented with tachycardia, leukocytosis with chest x-ray finding of pneumonia ?-cont on above abx ? ?Leg paresthesia ?Reports acute bilateral feet paresthesia for past 3 days. strong dorsalis pedis pulses heard on bedside doppler.  ?-ABI's reviewed, findings of mild RLE arterial disease ?-Vitamin P00 5110, folic acid 21.1  ?-[id number] suggests good control at 7.6  ?-Possible underlying diabetic neuropathy ?-ambulated at baseline per PT ? ?Obesity, Class III, BMI 40-49.9 (morbid obesity) ([place]) ?BMI 43 ?Recommend diet/lifestyle modification ? ?History of pulmonary embolism ?Continue Eliquis ?No evidence of bleed ? ?Non-small cell cancer of left lung ([place]) ?S/p SBRT  ?Currently on surveillance with oncology Dr. Earlie Server ? ? ?OSA (obstructive sleep apnea) ?Declined hospital CPAP ? ?DM2 (diabetes mellitus, type 2) ([place]) ?Hemoglobin A1c of 7.6 in March ?Placed on sensitive SSI ? ? ? ? ?  ? ?Subjective: Overall states  feeling unchanged, sob to bathroom ? ?Physical Exam: ?Vitals:  ? 06/17/21 2025 06/17/21 2034 06/18/21 0528 06/18/21 1500  ?BP: 129/79  119/80 122/76  ?Pulse: 98  84 86  ?Resp: 20  18 20   ?Temp: 97.7 ?F (36.5 ?C)  (!) 97.4 ?F (36.3 ?C) 97.8 ?F (36.6 ?C)  ?TempSrc:   Oral Oral  ?SpO2: 96% 96% (!) 88% 92%  ?Weight:      ?Height:      ? ?General exam: Conversant, in no acute distress ?Respiratory system: normal chest rise, clear, no audible wheezing ?Cardiovascular system: regular rhythm, s1-s2 ?Gastrointestinal system: Nondistended, nontender, pos BS ?Central nervous system: No seizures, no tremors ?Extremities: No cyanosis, no joint deformities ?Skin: No rashes, no pallor ?Psychiatry: Affect normal // no auditory hallucinations  ? ?Data Reviewed: ? ?Labs reviewed: Na 136, Cr 0.89 ? ?Family Communication: Pt in room, family not at bedside ? ?Disposition: ?Status is: Inpatient ?Requires inpatient stay because: Severity of illness ? Planned Discharge Destination: Home ? ? ? ?Author: ?Marylu Lund, MD ?06/18/2021 5:50 PM ? ?For on call review [email or web address].  ?

## 2021-06-18 NOTE — Evaluation (Signed)
Physical Therapy Evaluation ?Patient Details ?Name: Mario [name] ?MRN: [id number] ?DOB: Mar 26, 1950 ?Today's Date: 06/18/2021 ? ?History of Present Illness ? Pt is 71 yo male admitted on 06/16/21 with acute on chronic resp failure - COPD, CAP, sepsis and hx of [place] lung CA stage II s/p radiation. Pt also with new c/o leg paresthesia.  Pt's 4th admission for similar dx in past 6 weeks.  Pt with hx including PE in 2023, OSA on CPAP, HTN, chronic back pain, hx of ankylosing spondylitis, anxiety, and depression, T2DM.  ?Clinical Impression ?  ?Pt admitted with above diagnosis. At baseline, pt is limited ambulator with cane.  He has 3 L O2 at home but reports he does not use all of the time.  Today, pt ambulating 78' with min guard and cane.  He has been mobilizing in room independently.  Pt reports new bil foot decreased sensation, R LE mildly edematous.  Pt did require O2 for stable O2 sats with activity.  Pt currently with functional limitations due to the deficits listed below (see PT Problem List). Pt will benefit from skilled PT to increase their independence and safety with mobility to allow discharge to the venue listed below.   ? ?  SATURATION QUALIFICATIONS: (This note is used to comply with regulatory documentation for home oxygen) ? ?Patient Saturations on Room Air at Rest = 90% ? ?Patient Saturations on Room Air while Ambulating = 87% ? ?Patient Saturations on 3 Liters of oxygen while Ambulating = 93% ? ?Please briefly explain why patient needs home oxygen:In order to maintain oxygent saturation with activity. ?   ? ?Recommendations for follow up therapy are one component of a multi-disciplinary discharge planning process, led by the attending physician.  Recommendations may be updated based on patient status, additional functional criteria and insurance authorization. ? ?Follow Up Recommendations Home health PT ? ?  ?Assistance Recommended at Discharge PRN  ?Patient can return home with the following ? Assist  for transportation ? ?  ?Equipment Recommendations None recommended by PT  ?Recommendations for Other Services ?    ?  ?Functional Status Assessment Patient has had a recent decline in their functional status and demonstrates the ability to make significant improvements in function in a reasonable and predictable amount of time.  ? ?  ?Precautions / Restrictions Precautions ?Precautions: Fall  ? ?  ? ?Mobility ? Bed Mobility ?Overal bed mobility: Modified Independent ?Bed Mobility: Supine to Sit, Sit to Supine ?  ?  ?Supine to sit: Modified independent (Device/Increase time), HOB elevated ?Sit to supine: Modified independent (Device/Increase time), HOB elevated ?  ?General bed mobility comments: mobilizing in room on his own ?  ? ?Transfers ?Overall transfer level: Needs assistance ?Equipment used: None ?Transfers: Sit to/from Stand ?Sit to Stand: Modified independent (Device/Increase time) ?  ?  ?  ?  ?  ?General transfer comment: Pt mobilizing in room independently; demonstrated safely ?  ? ?Ambulation/Gait ?Ambulation/Gait assistance: Supervision ?Gait Distance (Feet): 80 Feet ?Assistive device: Straight cane ?Gait Pattern/deviations: Decreased stride length ?Gait velocity: decreased ?  ?  ?General Gait Details: Slow but steady gait; cues for posture; fatigued easily ? ?Stairs ?  ?  ?  ?  ?  ? ?Wheelchair Mobility ?  ? ?Modified Rankin (Stroke Patients Only) ?  ? ?  ? ?Balance Overall balance assessment: Needs assistance ?Sitting-balance support: No upper extremity supported ?Sitting balance-Leahy Scale: Good ?  ?  ?Standing balance support: During functional activity, No upper extremity supported ?Standing balance-Leahy  Scale: Good ?Standing balance comment: Cane to ambulate but walked to bathroom without AD ?  ?  ?  ?  ?  ?  ?  ?  ?  ?  ?  ?   ? ? ? ?Pertinent Vitals/Pain Pain Assessment ?Pain Assessment: Faces ?Faces Pain Scale: Hurts a little bit ?Pain Location: chronic back pain ?Pain Descriptors /  Indicators: Discomfort ?Pain Intervention(s): Limited activity within patient's tolerance, Monitored during session  ? ? ?Home Living Family/patient expects to be discharged to:: Private residence ?Living Arrangements: Non-relatives/Friends ?Available Help at Discharge: Friend(s);Available PRN/intermittently ?Type of Home: Apartment ?Home Access: Stairs to enter ?Entrance Stairs-Rails: Right ?Entrance Stairs-Number of Steps: 5 ?  ?Home Layout: One level ?Home Equipment: Kasandra Knudsen - single point;[place]ower [place] (2 wheels) ?Additional Comments: wears 3 L O2 with activity (not 100% of the time per pt) and sleep  ?  ?Prior Function Prior Level of Function : Independent/Modified Independent ?  ?  ?  ?  ?  ?  ?Mobility Comments: ambulates within his home without an AD; typically utilizes a w/c when at dr's appointments; otherwise does not leave his home ?ADLs Comments: independent with ADLs and IADLs; reports sits to shower, easily fatigues ?  ? ? ?Hand Dominance  ? Dominant Hand: Right ? ?  ?Extremity/Trunk Assessment  ? Upper Extremity Assessment ?Upper Extremity Assessment: Overall WFL for tasks assessed ?  ? ?Lower Extremity Assessment ?Lower Extremity Assessment: LLE deficits/detail;RLE deficits/detail ?RLE Deficits / Details: ROM WFL ; MMT: ankle 5/5, knee 5/5, hip 4/5; + edema R foot and ankle ?RLE Sensation: decreased light touch (Bil plantar feet) ?LLE Deficits / Details: ROM WFL ; MMT: ankle 5/5, knee 5/5, hip 4/5 ?LLE Sensation: decreased light touch (Bil plantar feet) ?  ? ?Cervical / Trunk Assessment ?Cervical / Trunk Assessment: Other exceptions ?Cervical / Trunk Exceptions: hx of back pain  ?Communication  ? Communication: No difficulties  ?Cognition Arousal/Alertness: Awake/alert ?Behavior During Therapy: [facility] for tasks assessed/performed ?Overall Cognitive Status: Within Functional Limits for tasks assessed ?  ?  ?  ?  ?  ?  ?  ?  ?  ?  ?  ?  ?  ?  ?  ?  ?  ?  ?  ? ?  ?General Comments General  comments (skin integrity, edema, etc.): Pt on 3 L O2 sats 96%.  Tried RA rest sats 90%.  RA to ambulate down to 87%.  2 L ambulate 88%.  3L ambulate 93%.  Has 3 L at home but reports doesn't use consistently.   Gave pt incentive spirometer - able to draw in 1750 mL x 3. ? ?  ?Exercises    ? ?Assessment/Plan  ?  ?PT Assessment Patient needs continued PT services  ?PT Problem List Decreased strength;Decreased activity tolerance;Decreased mobility;Cardiopulmonary status limiting activity;Decreased balance;Decreased knowledge of use of DME ? ?   ?  ?PT Treatment Interventions Gait training;DME instruction;Stair training;Functional mobility training;Therapeutic activities;Therapeutic exercise;Balance training;Neuromuscular re-education;Patient/family education   ? ?PT Goals (Current goals can be found in the Care Plan section)  ?Acute Rehab PT Goals ?Patient Stated Goal: for his breathing to improve ?PT Goal Formulation: With patient ?Time For Goal Achievement: 07/02/21 ?Potential to Achieve Goals: Good ? ?  ?Frequency Min 3X/week ?  ? ? ?Co-evaluation   ?  ?  ?  ?  ? ? ?  ?AM-PAC PT "6 Clicks" Mobility  ?Outcome Measure Help needed turning from your back to your side while in a flat bed  without using bedrails?: None ?Help needed moving from lying on your back to sitting on the side of a flat bed without using bedrails?: None ?Help needed moving to and from a bed to a chair (including a wheelchair)?: None ?Help needed standing up from a chair using your arms (e.g., wheelchair or bedside chair)?: None ?Help needed to walk in hospital room?: A Little ?Help needed climbing 3-5 steps with a railing? : A Little ?6 Click Score: 22 ? ?  ?End of Session Equipment Utilized During Treatment: Oxygen ?Activity Tolerance: Patient tolerated treatment well ?Patient left: in bed;with call bell/phone within reach (no alarm - pt mobilizing independently in room) ?Nurse Communication: Mobility status ?PT Visit Diagnosis: Other  abnormalities of gait and mobility (R26.89) ?  ? ?Time: 2820-8138 ?PT Time Calculation (min) (ACUTE ONLY): 35 min ? ? ?Charges:   PT Evaluation ?$PT Eval Low Complexity: 1 Low ?PT Treatments ?$Gait Training: 8-22 mins ?  ?   ?

## 2021-06-19 DIAGNOSIS — J189 Pneumonia, unspecified organism: Secondary | ICD-10-CM | POA: Diagnosis not present

## 2021-06-19 DIAGNOSIS — Z86711 Personal history of pulmonary embolism: Secondary | ICD-10-CM | POA: Diagnosis not present

## 2021-06-19 DIAGNOSIS — J441 Chronic obstructive pulmonary disease with (acute) exacerbation: Secondary | ICD-10-CM | POA: Diagnosis not present

## 2021-06-19 LAB — COMPREHENSIVE METABOLIC PANEL
ALT: 22 U/L (ref 0–44)
AST: 33 U/L (ref 15–41)
Albumin: 3.5 g/dL (ref 3.5–5.0)
Alkaline Phosphatase: 67 U/L (ref 38–126)
Anion gap: 7 (ref 5–15)
BUN: 22 mg/dL (ref 8–23)
CO2: 29 mmol/L (ref 22–32)
Calcium: 8.8 mg/dL — ABNORMAL LOW (ref 8.9–10.3)
Chloride: 96 mmol/L — ABNORMAL LOW (ref 98–111)
Creatinine, Ser: 1.12 mg/dL (ref 0.61–1.24)
GFR, Estimated: >60 mL/min (ref >60)
Glucose, Bld: 175 mg/dL — ABNORMAL HIGH (ref 70–99)
Potassium: 5.3 mmol/L — ABNORMAL HIGH (ref 3.5–5.1)
Sodium: 132 mmol/L — ABNORMAL LOW (ref 135–145)
Total Bilirubin: 1.5 mg/dL — ABNORMAL HIGH (ref 0.3–1.2)
Total Protein: 6.5 g/dL (ref 6.5–8.1)

## 2021-06-19 LAB — CBC
HCT: 40.8 % (ref 39.0–52.0)
Hemoglobin: 13.7 g/dL (ref 13.0–17.0)
MCH: 33.7 pg (ref 26.0–34.0)
MCHC: 33.6 g/dL (ref 30.0–36.0)
MCV: 100.5 fL — ABNORMAL HIGH (ref 80.0–100.0)
Platelets: 244 10*3/uL (ref 150–400)
RBC: 4.06 MIL/uL — ABNORMAL LOW (ref 4.22–5.81)
RDW: 14 % (ref 11.5–15.5)
WBC: 10.3 10*3/uL (ref 4.0–10.5)
nRBC: 0 % (ref 0.0–0.2)

## 2021-06-19 MED ORDER — OXYCODONE-ACETAMINOPHEN 5-325 MG PO TABS
1.0000 | ORAL_TABLET | ORAL | Status: DC | PRN
  Administered 2021-06-19 – 2021-06-21 (×3): 1 via ORAL
  Filled 2021-06-19 (×4): qty 1

## 2021-06-19 MED ORDER — CLOBETASOL PROPIONATE 0.05 % EX OINT
TOPICAL_OINTMENT | Freq: Two times a day (BID) | CUTANEOUS | Status: DC | PRN
Start: 2021-06-19 — End: 2021-07-06
  Filled 2021-06-19: qty 15

## 2021-06-19 MED ORDER — GABAPENTIN 300 MG PO CAPS
300.0000 mg | ORAL_CAPSULE | Freq: Every day | ORAL | Status: DC
  Administered 2021-06-19 – 2021-06-21 (×3): 300 mg via ORAL
  Filled 2021-06-19 (×3): qty 1

## 2021-06-19 NOTE — Progress Notes (Signed)
?  Progress Note ? ? ?Patient: Mario [name] NOB:[id number] DOB: Jul 13, 1950 DOA: 06/16/2021     2 ?DOS: the patient was seen and examined on 06/19/2021 ?  ?Brief hospital course: ?71 y.o. 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 ? ?He was given IV vancomycin and cefepime with Solu-Medrol in the ED.  Hospitalist called for admission for management of left lower lobe pneumonia seen on imaging. ? ?Assessment and Plan: ?Acute on chronic respiratory failure ([place]) ?Baseline on 3L with reported hypoxia to 76% at home. CXR findings of Left lower lobe pneumonia ?-continue IV cefepime. MRSA PCR negative recently ?-test for strep and Legionella urine antigen ?-Flutter valve and incentive spirometry ?-Now on baseline O2 requirements, however pt reports increased dyspnea on ambulation ?-Cont neb tx as needed. Will ensure flutter valve ? ?Sepsis ([place]) ?Presented with tachycardia, leukocytosis with chest x-ray finding of pneumonia ?-cont on above abx ? ?Leg paresthesia ?Reports acute bilateral feet paresthesia for past 3 days. strong dorsalis pedis pulses heard on bedside doppler.  ?-Numbness noted primarily at soles of B feet. Reports of neuropathy and had been on 900mg  neurontin TID, however was cut back to 900mg  qhs per his behavioral provider ?-ABI's reviewed, findings of mild RLE arterial disease ?-Vitamin H47 6546, folic acid 50.3  ?-[id number] suggests fair control at 7.6  ?-Possible underlying diabetic neuropathy ?-ambulated at baseline per PT ?-will add neuronin 300mg  daily in addition to 900mg  qhs ? ?Obesity, Class III, BMI 40-49.9 (morbid obesity) ([place]) ?BMI 43 ?Recommend diet/lifestyle modification ? ?History of pulmonary embolism ?Continue Eliquis ?No evidence of bleed ? ?Non-small  cell cancer of left lung ([place]) ?S/p SBRT  ?Currently on surveillance with oncology Dr. Earlie Server ? ? ?OSA (obstructive sleep apnea) ?Declined hospital CPAP ? ?DM2 (diabetes mellitus, type 2) ([place]) ?Hemoglobin A1c of 7.6 in March ?Glycemic trends stable ?Cont SSI ? ? ? ? ?  ? ?Subjective: Feels increased SOB on ambulating to bathroom ? ?Physical Exam: ?Vitals:  ? 06/18/21 2200 06/19/21 0447 06/19/21 0802 06/19/21 1427  ?BP: 111/76 123/88  119/73  ?Pulse: 92 88  95  ?Resp:  16  20  ?Temp:  97.7 ?F (36.5 ?C)  97.9 ?F (36.6 ?C)  ?TempSrc:      ?SpO2:  94% 95% 95%  ?Weight:      ?Height:      ? ?General exam: Awake, laying in bed, in nad ?Respiratory system: Normal respiratory effort, no wheezing ?Cardiovascular system: regular rate, s1, s2 ?Gastrointestinal system: Soft, nondistended, positive BS ?Central nervous system: CN2-12 grossly intact, strength intact ?Extremities: Perfused, no clubbing ?Skin: Normal skin turgor, no notable skin lesions seen ?Psychiatry: Mood normal // no visual hallucinations  ? ?Data Reviewed: ? ?Labs reviewed: Na 132, K 5.3, Cr 1.12 ? ?Family Communication: Pt in room, family not at bedside ? ?Disposition: ?Status is: Inpatient ?Requires inpatient stay because: Severity of illness ? Planned Discharge Destination: Home ? ? ? ?Author: ?Marylu Lund, MD ?06/19/2021 4:21 PM ? ?For on call review [email or web address]  ?

## 2021-06-20 ENCOUNTER — Inpatient Hospital Stay (HOSPITAL_COMMUNITY): Payer: No Typology Code available for payment source

## 2021-06-20 DIAGNOSIS — J189 Pneumonia, unspecified organism: Secondary | ICD-10-CM | POA: Diagnosis not present

## 2021-06-20 DIAGNOSIS — E1101 Type 2 diabetes mellitus with hyperosmolarity with coma: Secondary | ICD-10-CM

## 2021-06-20 DIAGNOSIS — Z86711 Personal history of pulmonary embolism: Secondary | ICD-10-CM | POA: Diagnosis not present

## 2021-06-20 LAB — CBC
HCT: 39.4 % (ref 39.0–52.0)
Hemoglobin: 13 g/dL (ref 13.0–17.0)
MCH: 32.3 pg (ref 26.0–34.0)
MCHC: 33 g/dL (ref 30.0–36.0)
MCV: 98 fL (ref 80.0–100.0)
Platelets: 217 10*3/uL (ref 150–400)
RBC: 4.02 MIL/uL — ABNORMAL LOW (ref 4.22–5.81)
RDW: 13.8 % (ref 11.5–15.5)
WBC: 8.8 10*3/uL (ref 4.0–10.5)
nRBC: 0 % (ref 0.0–0.2)

## 2021-06-20 LAB — COMPREHENSIVE METABOLIC PANEL
ALT: 18 U/L (ref 0–44)
AST: 13 U/L — ABNORMAL LOW (ref 15–41)
Albumin: 3.3 g/dL — ABNORMAL LOW (ref 3.5–5.0)
Alkaline Phosphatase: 74 U/L (ref 38–126)
Anion gap: 7 (ref 5–15)
BUN: 21 mg/dL (ref 8–23)
CO2: 29 mmol/L (ref 22–32)
Calcium: 8.8 mg/dL — ABNORMAL LOW (ref 8.9–10.3)
Chloride: 95 mmol/L — ABNORMAL LOW (ref 98–111)
Creatinine, Ser: 1.04 mg/dL (ref 0.61–1.24)
GFR, Estimated: >60 mL/min (ref >60)
Glucose, Bld: 162 mg/dL — ABNORMAL HIGH (ref 70–99)
Potassium: 4.5 mmol/L (ref 3.5–5.1)
Sodium: 131 mmol/L — ABNORMAL LOW (ref 135–145)
Total Bilirubin: 0.4 mg/dL (ref 0.3–1.2)
Total Protein: 6.2 g/dL — ABNORMAL LOW (ref 6.5–8.1)

## 2021-06-20 LAB — GLUCOSE, CAPILLARY
Glucose-Capillary: 162 mg/dL — ABNORMAL HIGH (ref 70–99)
Glucose-Capillary: 139 mg/dL — ABNORMAL HIGH (ref 70–99)
Glucose-Capillary: 179 mg/dL — ABNORMAL HIGH (ref 70–99)
Glucose-Capillary: 122 mg/dL — ABNORMAL HIGH (ref 70–99)
Glucose-Capillary: 158 mg/dL — ABNORMAL HIGH (ref 70–99)
Glucose-Capillary: 125 mg/dL — ABNORMAL HIGH (ref 70–99)
Glucose-Capillary: 130 mg/dL — ABNORMAL HIGH (ref 70–99)

## 2021-06-20 NOTE — Progress Notes (Signed)
Pharmacy Antibiotic Note ? ?Mario [name] is a 71 y.o. male admitted on 06/16/2021 with shortness of breath and hypoxia.  Pt has a  hx of diabetes, lung cancer, recurrent pneumonia, COPD on 3 L nasal cannula at baseline, PE on Eliquis and recently admitted for pna. Pharmacy has been consulted to dose cefepime for HCAP. ? ?06/20/2021 ?Day #5 abx. WBC WNL, AF. SCr WNL ? ?Plan: ?Continue cefepime 2 gm IV q8h ?? Length of therapy ?Rec change to PO abx & add stop date ? ?Height: 6\' 2"  (188 cm) ?Weight: (!) 152.1 kg (335 lb 5.1 oz) ?IBW/kg (Calculated) : 82.2 ? ?Temp (24hrs), Avg:97.6 ?F (36.4 ?C), Min:97.3 ?F (36.3 ?C), Max:97.9 ?F (36.6 ?C) ? ?Recent Labs  ?Lab 06/16/21 ?2125 06/16/21 ?2149 06/17/21 ?1638 06/17/21 ?4665 06/18/21 ?0507 06/19/21 ?9935 06/20/21 ?7017  ?WBC 13.0*  --  11.3*  --  11.9* 10.3 8.8  ?CREATININE 1.12 1.00 0.99  --  0.89 1.12 1.04  ?LATICACIDVEN 2.6*  --   --  2.6*  --   --   --   ? ?  ?Estimated Creatinine Clearance: 104.5 mL/min (by C-G formula based on SCr of 1.04 mg/dL).   ? ?Allergies  ?Allergen Reactions  ? Demerol [Meperidine] Nausea And Vomiting and Other (See Comments)  ?  Made the patient "violently sick"  ? Zocor [Simvastatin] Nausea And Vomiting and Other (See Comments)  ?  Made him very jittery, also  ? ? ?4/19 Cefepime  >> ?4/19 Vanc x 1 ?  ?4/20 BCx x2: ngtd ?4/20 legionella - ?4/20 strep pneumo - ? ?Thank you for allowing pharmacy to be a part of this patient?s care. ? ?Eudelia Bunch, Pharm.D ?06/20/2021 9:15 AM ? ?

## 2021-06-20 NOTE — TOC Initial Note (Signed)
Transition of Care (TOC) - Initial/Assessment Note  ? ? ?Patient Details  ?Name: Mario [name] ?MRN: [id number] ?Date of Birth: [birth year] ? ?Transition of Care (TOC) CM/SW Contact:    ?Ross Ludwig, LCSW ?Phone Number: ?06/20/2021, 4:32 PM ? ?Clinical Narrative:                 ? ?Patient is a 71 year old male who is alert and oriented x4.  Patient lives with a friend, and is currently receiving home health PT from [facility].  Patient has oxygen at home through the [place].  Patient was recently discharged from hospital,and then was readmitted again.  Patient plans to return back to his friend's house.  Patient did not express any other issues or concerns. ? ?Expected Discharge Plan: [place] ?Barriers to Discharge: Continued Medical Work up ? ? ?Patient Goals and CMS Choice ?Patient states their goals for this hospitalization and ongoing recovery are:: To return back home with home health PT. ?CMS Medicare.gov Compare Post Acute Care list provided to:: Patient ?Choice offered to / list presented to : Patient ? ?Expected Discharge Plan and Services ?Expected Discharge Plan: [place] ?  ?  ?Post Acute Care Choice: Home Health ?Living arrangements for the past 2 months: [place] ?                ?  ?  ?  ?  ?  ?HH Arranged: PT ?[place] Agency: [facility] ?Date HH Agency Contacted: 06/20/21 ?Time [place]: 6314 ?Representative spoke with at [place]: [name] ? ?Prior Living Arrangements/Services ?Living arrangements for the past 2 months: [place] ?Lives with:: Friends ?Patient language and need for interpreter reviewed:: Yes ?Do you feel safe going back to the place where you live?: Yes      ?Need for Family Participation in Patient Care: No (Comment) ?Care giver support system in place?: No (comment) ?Current home services: Home PT ?Criminal Activity/Legal Involvement Pertinent to Current Situation/Hospitalization: No - Comment as  needed ? ?Activities of Daily Living ?Home Assistive Devices/Equipment: Bedside commode/3-in-1, Cane (specify quad or straight), Oxygen ?ADL Screening (condition at time of admission) ?Patient's cognitive ability adequate to safely complete daily activities?: No ?Is the patient deaf or have difficulty hearing?: No ?Does the patient have difficulty seeing, even when wearing glasses/contacts?: No ?Does the patient have difficulty concentrating, remembering, or making decisions?: No ?Patient able to express need for assistance with ADLs?: Yes ?Does the patient have difficulty dressing or bathing?: No ?Independently performs ADLs?: No ?Communication: Independent ?Dressing (OT): Independent ?Grooming: Independent ?Feeding: Independent ?Bathing: Needs assistance ?Is this a change from baseline?: Pre-admission baseline ?Toileting: Needs assistance ?Is this a change from baseline?: Pre-admission baseline ?In/Out Bed: Independent ?Walks in Home: Independent ?Does the patient have difficulty walking or climbing stairs?: Yes ?Weakness of Legs: Both ?Weakness of Arms/Hands: None ? ?Permission Sought/Granted ?Permission sought to share information with : Case Manager, Family Supports ?Permission granted to share information with : Yes, Verbal Permission Granted ? Share Information with NAME: Manuella Ghazi [phone]  [phone]  CRIOLLO,BOBBI Daughter [phone]  [phone]  [facility] Brother [phone]  [phone] ? Permission granted to share info w AGENCY: Home health agencies ?   ?   ? ?Emotional Assessment ?Appearance:: Appears stated age ?  ?Affect (typically observed): Calm, Appropriate, Accepting, Stable ?Orientation: : Oriented to Self, Oriented to Place, Oriented to  Time, Oriented to Situation ?Alcohol / Substance Use: Not Applicable ?Psych  Involvement: No (comment) ? ?Admission diagnosis:  COPD exacerbation ([place]) [J44.1] ?HCAP (healthcare-associated pneumonia) [J18.9] ?Pneumonia [J18.9] ?Patient  Active Problem List  ? Diagnosis Date Noted  ? Leg paresthesia 06/17/2021  ? Sepsis ([place]) 06/17/2021  ? Obesity, Class III, BMI 40-49.9 (morbid obesity) ([place]) 06/17/2021  ? Pneumonia 06/17/2021  ? Acute respiratory failure with hypoxia ([place]) 06/04/2021  ? Acute on chronic respiratory failure with hypoxia ([place]) 06/04/2021  ? Hypertension   ? Allergic rhinitis   ? History of pulmonary embolism   ? Acute hypoxemic respiratory failure ([place]) 05/21/2021  ? Hyponatremia 05/20/2021  ? Acute pulmonary embolism without acute cor pulmonale ([place]) 02/03/2021  ? Mood disorder ([place]) 02/03/2021  ? Acute on chronic respiratory failure ([place]) 01/09/2021  ? Non-small cell cancer of left lung ([place]) 09/21/2020  ? Lung nodule 07/30/2020  ? Mass of left lung   ? Acute respiratory failure with hypoxemia ([place]) 07/26/2020  ? Constipation   ? HCAP (healthcare-associated pneumonia) 04/03/2020  ? COPD with acute exacerbation ([place]) 01/05/2020  ? Vertigo 12/26/2019  ? 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 Stage 3a chronic kidney disease ([place]) 12/26/2019  ? Abnormal CT of the chest 12/26/2019  ? COPD (chronic obstructive pulmonary disease) ([place])   ? AKI (acute kidney injury) ([place])   ? Hyperglycemia 06/23/2019  ? OSA (obstructive sleep apnea) 06/23/2019  ? Suicidal ideation 06/23/2019  ? Major depressive disorder, recurrent episode ([place]) 10/12/2018  ? 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 Cellulitis of both feet 07/16/2017  ? DM2 (diabetes mellitus, type 2) ([place]) 07/16/2017  ? HLA B27 (HLA B27 positive) 07/16/2017  ? Acute chest pain   ? Hyperkalemia   ? Spontaneous pneumothorax 06/27/2016  ? Hypoxia   ? Pneumothorax on left   ? Dyspnea 05/23/2014  ? COPD exacerbation ([place]) 05/23/2014  ? Malingering 01/21/2013  ? Depression 01/11/2013  ? Tobacco abuse 01/11/2013  ? Obesity, unspecified 01/11/2013  ? Chest pain 01/10/2013  ? Hypertension associated with diabetes ([place]) 01/10/2013  ? ?PCP:  [facility] ?Pharmacy:   ?[facility], [address] ?[phone] ?[place] [address] ?Phone: [phone] Fax: [phone] ? ? ? ? ?Social Determinants of Health (SDOH) Interventions ?  ? ?Readmission Risk Interventions ? ?  06/07/2021  ?  4:15 PM  ?Readmission Risk Prevention Plan  ?Transportation Screening Complete  ?Medication Review Press photographer) Complete  ?PCP or Specialist appointment within 3-5 days of discharge Complete  ?[place] or Home Care Consult Complete  ?SW Recovery Care/Counseling Consult Complete  ?Palliative Care Screening Not Applicable  ?[place] Not Applicable  ? ? ? ?

## 2021-06-20 NOTE — TOC Progression Note (Addendum)
Transition of Care (TOC) - Progression Note  ? ? ?Patient Details  ?Name: Mario [name] ?MRN: [id number] ?Date of Birth: 21-Sep-1950 ? ?Transition of Care (TOC) CM/SW Contact  ?Ross Ludwig, LCSW ?Phone Number: ?06/20/2021, 3:30 PM ? ?Clinical Narrative:    ? ?Patient currently open to [place] for [facility] PT.  CSW to continue to follow patient's progress throughout discharge planning.  Patient has oxygen at home through the [place]. ? ? ?  ?  ? ?Expected Discharge Plan and Services ? Home with home health ?  ?  ?  ?  ?                ?  ?  ?  ?  ?  ?  ?  ?  ?  ?  ? ? ?Social Determinants of Health (SDOH) Interventions ?  ? ?Readmission Risk Interventions ? ?  06/07/2021  ?  4:15 PM  ?Readmission Risk Prevention Plan  ?Transportation Screening Complete  ?Medication Review Press photographer) Complete  ?PCP or Specialist appointment within 3-5 days of discharge Complete  ?[place] or Home Care Consult Complete  ?SW Recovery Care/Counseling Consult Complete  ?Palliative Care Screening Not Applicable  ?[place] Not Applicable  ? ? ?

## 2021-06-20 NOTE — Plan of Care (Signed)
Pt aox4, pleasant and cooperative. Independent in room.  ?Reports feeling more short of breath with exertion and at rest.  ?Currently on 3LNC, saturations acceptable.  ?IV abx and breathing treatments per orders.  ? ?Problem: Education: ?Goal: Knowledge of General Education information will improve ?Description: Including pain rating scale, medication(s)/side effects and non-pharmacologic comfort measures ?Outcome: Progressing ?  ?Problem: Health Behavior/Discharge Planning: ?Goal: Ability to manage health-related needs will improve ?Outcome: Progressing ?  ?Problem: Clinical Measurements: ?Goal: Ability to maintain clinical measurements within normal limits will improve ?Outcome: Progressing ?Goal: Will remain free from infection ?Outcome: Progressing ?Goal: Diagnostic test results will improve ?Outcome: Progressing ?Goal: Respiratory complications will improve ?Outcome: Progressing ?Goal: Cardiovascular complication will be avoided ?Outcome: Progressing ?  ?Problem: Activity: ?Goal: Risk for activity intolerance will decrease ?Outcome: Progressing ?  ?Problem: Nutrition: ?Goal: Adequate nutrition will be maintained ?Outcome: Progressing ?  ?Problem: Coping: ?Goal: Level of anxiety will decrease ?Outcome: Progressing ?  ?Problem: Elimination: ?Goal: Will not experience complications related to bowel motility ?Outcome: Progressing ?Goal: Will not experience complications related to urinary retention ?Outcome: Progressing ?  ?Problem: Pain Managment: ?Goal: General experience of comfort will improve ?Outcome: Progressing ?  ?Problem: Safety: ?Goal: Ability to remain free from injury will improve ?Outcome: Progressing ?  ?Problem: Skin Integrity: ?Goal: Risk for impaired skin integrity will decrease ?Outcome: Progressing ?  ?Problem: Activity: ?Goal: Ability to tolerate increased activity will improve ?Outcome: Progressing ?  ?Problem: Clinical Measurements: ?Goal: Ability to maintain a body temperature in the normal  range will improve ?Outcome: Progressing ?  ?Problem: Respiratory: ?Goal: Ability to maintain adequate ventilation will improve ?Outcome: Progressing ?Goal: Ability to maintain a clear airway will improve ?Outcome: Progressing ?  ? ?

## 2021-06-20 NOTE — Progress Notes (Signed)
Pt complaining of SOB at rest. ?No changes in breath sounds from this am, diminished no wheezes.  ?SpO2, 99% on 3LNC currently. Mildly tachypneic at 22.  ?RT notified, pt requesting albuterol treatment.  ?Encouraged pursed lip breathing and to use IS/FV as tolerated.  ?Will continue to monitor.  ?

## 2021-06-20 NOTE — Progress Notes (Signed)
?  Progress Note ? ? ?Patient: Mario [name]:[id number] DOB: 1950-11-04 DOA: 06/16/2021     3 ?DOS: the patient was seen and examined on 06/20/2021 ?  ?Brief hospital course: ?71 y.o. 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 ? ?He was given IV vancomycin and cefepime with Solu-Medrol in the ED.  Hospitalist called for admission for management of left lower lobe pneumonia seen on imaging. ? ?Assessment and Plan: ?Acute on chronic respiratory failure ([place]) ?Baseline on 3L with reported hypoxia to 76% at home. CXR findings of Left lower lobe pneumonia ?-continue IV cefepime. MRSA PCR negative recently ?-test for strep and Legionella urine antigen ?-Flutter valve and incentive spirometry ?-Now on baseline O2 requirements, ?-Continues to have increased dyspnea on even short ambulation ?-have ordered f/u CXR ? ?Sepsis ([place]) ?Presented with tachycardia, leukocytosis with chest x-ray finding of pneumonia ?-cont on above abx ? ?Leg paresthesia ?Reports acute bilateral feet paresthesia for past 3 days. strong dorsalis pedis pulses heard on bedside doppler.  ?-Numbness noted primarily at soles of B feet. Reports of neuropathy and had been on 900mg  neurontin TID, however was cut back to 900mg  qhs per his behavioral provider ?-ABI's reviewed, findings of mild RLE arterial disease ?-Vitamin Y86 5784, folic acid 69.6  ?-EXB2W suggests fair control at 7.6  ?-Possible underlying diabetic neuropathy ?-ambulated at baseline per PT ?-added neuronin 300mg  daily in addition to 900mg  qhs without much effect. Will increase to 300mg  BID with continued 900mg  qhs ? ?Obesity, Class III, BMI 40-49.9 (morbid obesity) ([place]) ?BMI 43 ?Recommend diet/lifestyle modification ? ?History of pulmonary  embolism ?Continue Eliquis ?No evidence of bleed ? ?Non-small cell cancer of left lung ([place]) ?S/p SBRT  ?Currently on surveillance with oncology Dr. Earlie Server ? ? ?OSA (obstructive sleep apnea) ?Declined hospital CPAP ? ?DM2 (diabetes mellitus, type 2) ([place]) ?Hemoglobin A1c of 7.6 in March ?Glycemic trends stable ?Cont SSI ? ? ? ? ?  ? ?Subjective: Reports increased sob on ambulation to bathroom ? ?Physical Exam: ?Vitals:  ? 06/19/21 2039 06/20/21 0513 06/20/21 0736 06/20/21 1443  ?BP: 121/78 133/78  (!) 109/48  ?Pulse: 85 80  82  ?Resp: 20 19  18   ?Temp: (!) 97.5 ?F (36.4 ?C) (!) 97.3 ?F (36.3 ?C)  98 ?F (36.7 ?C)  ?TempSrc: Oral Oral  Oral  ?SpO2: 98% 94% 95% 96%  ?Weight:      ?Height:      ? ?General exam: Conversant, in no acute distress ?Respiratory system: normal chest rise, clear, no audible wheezing ?Cardiovascular system: regular rhythm, s1-s2 ?Gastrointestinal system: Nondistended, nontender, pos BS ?Central nervous system: No seizures, no tremors ?Extremities: No cyanosis, no joint deformities ?Skin: No rashes, no pallor ?Psychiatry: Affect normal // no auditory hallucinations  ? ?Data Reviewed: ? ?Labs reviewed: Na 131, K 4.5, Cr 1.04 ? ?Family Communication: Pt in room, family not at bedside ? ?Disposition: ?Status is: Inpatient ?Requires inpatient stay because: Severity of illness ? Planned Discharge Destination: Home ? ? ? ?Author: ?Marylu Lund, MD ?06/20/2021 4:32 PM ? ?For on call review [email or web address].  ?

## 2021-06-21 DIAGNOSIS — Z86711 Personal history of pulmonary embolism: Secondary | ICD-10-CM | POA: Diagnosis not present

## 2021-06-21 DIAGNOSIS — J441 Chronic obstructive pulmonary disease with (acute) exacerbation: Secondary | ICD-10-CM | POA: Diagnosis not present

## 2021-06-21 DIAGNOSIS — J189 Pneumonia, unspecified organism: Secondary | ICD-10-CM | POA: Diagnosis not present

## 2021-06-21 LAB — GLUCOSE, CAPILLARY
Glucose-Capillary: 204 mg/dL — ABNORMAL HIGH (ref 70–99)
Glucose-Capillary: 140 mg/dL — ABNORMAL HIGH (ref 70–99)
Glucose-Capillary: 120 mg/dL — ABNORMAL HIGH (ref 70–99)

## 2021-06-21 LAB — COMPREHENSIVE METABOLIC PANEL
ALT: 18 U/L (ref 0–44)
AST: 8 U/L — ABNORMAL LOW (ref 15–41)
Albumin: 3.1 g/dL — ABNORMAL LOW (ref 3.5–5.0)
Alkaline Phosphatase: 61 U/L (ref 38–126)
Anion gap: 7 (ref 5–15)
BUN: 25 mg/dL — ABNORMAL HIGH (ref 8–23)
CO2: 29 mmol/L (ref 22–32)
Calcium: 8.2 mg/dL — ABNORMAL LOW (ref 8.9–10.3)
Chloride: 94 mmol/L — ABNORMAL LOW (ref 98–111)
Creatinine, Ser: 0.84 mg/dL (ref 0.61–1.24)
GFR, Estimated: >60 mL/min (ref >60)
Glucose, Bld: 199 mg/dL — ABNORMAL HIGH (ref 70–99)
Potassium: 4.7 mmol/L (ref 3.5–5.1)
Sodium: 130 mmol/L — ABNORMAL LOW (ref 135–145)
Total Bilirubin: 0.4 mg/dL (ref 0.3–1.2)
Total Protein: 5.7 g/dL — ABNORMAL LOW (ref 6.5–8.1)

## 2021-06-21 LAB — CBC
HCT: 35.3 % — ABNORMAL LOW (ref 39.0–52.0)
Hemoglobin: 11.8 g/dL — ABNORMAL LOW (ref 13.0–17.0)
MCH: 32.6 pg (ref 26.0–34.0)
MCHC: 33.4 g/dL (ref 30.0–36.0)
MCV: 97.5 fL (ref 80.0–100.0)
Platelets: 214 10*3/uL (ref 150–400)
RBC: 3.62 MIL/uL — ABNORMAL LOW (ref 4.22–5.81)
RDW: 13.3 % (ref 11.5–15.5)
WBC: 9.3 10*3/uL (ref 4.0–10.5)
nRBC: 0 % (ref 0.0–0.2)

## 2021-06-21 MED ORDER — GABAPENTIN 300 MG PO CAPS
300.0000 mg | ORAL_CAPSULE | Freq: Two times a day (BID) | ORAL | Status: DC
Start: 2021-06-22 — End: 2021-07-06
  Administered 2021-06-22 – 2021-07-06 (×28): 300 mg via ORAL
  Filled 2021-06-21 (×29): qty 1

## 2021-06-21 MED ORDER — OXYCODONE-ACETAMINOPHEN 5-325 MG PO TABS
1.0000 | ORAL_TABLET | ORAL | Status: DC | PRN
  Administered 2021-06-21 – 2021-07-06 (×50): 2 via ORAL
  Filled 2021-06-21 (×51): qty 2

## 2021-06-21 MED ORDER — ENOXAPARIN SODIUM 80 MG/0.8ML IJ SOSY
80.0000 mg | PREFILLED_SYRINGE | Freq: Every day | INTRAMUSCULAR | Status: DC
  Administered 2021-06-21 – 2021-06-22 (×2): 80 mg via SUBCUTANEOUS
  Filled 2021-06-21 (×2): qty 0.8

## 2021-06-21 MED ORDER — ENOXAPARIN SODIUM 40 MG/0.4ML IJ SOSY: 40.0000 mg | PREFILLED_SYRINGE | INTRAMUSCULAR | Status: DC

## 2021-06-21 NOTE — Progress Notes (Signed)
Physical Therapy Treatment ?Patient Details ?Name: Mario [name] ?MRN: [id number] ?DOB: October 03, 1950 ?Today's Date: 06/21/2021 ? ? ?History of Present Illness Pt is 70 yo male admitted on 06/16/21 with acute on chronic resp failure - COPD, CAP, sepsis and hx of [place] lung CA stage II s/p radiation. Pt also with new c/o leg paresthesia.  Pt's 4th admission for similar dx in past 6 weeks.  Pt with hx including PE in 2023, OSA on CPAP, HTN, chronic back pain, hx of ankylosing spondylitis, anxiety, and depression, T2DM. ? ?  ?PT Comments  ? ? Patient agreeable to ambulate and complete exercises for LE strength at EOS. Pt reports DOE of 3/4 with ambulated, sats 94% or greater with gait and desat to 84% after with seated rest briefly before recovering to 91% on 3L/min. Encouraged use of IS/FV and pursed lip breathing throughout mobility to improve exhalation and increase SpO2. Will continue to progress pt as able. Continue to recommend home with intermittent assist from friend/roommate once medically ready for discharge. ? ?  ?Recommendations for follow up therapy are one component of a multi-disciplinary discharge planning process, led by the attending physician.  Recommendations may be updated based on patient status, additional functional criteria and insurance authorization. ? ?Follow Up Recommendations ? Home health PT ?  ?  ?Assistance Recommended at Discharge PRN  ?Patient can return home with the following Assist for transportation ?  ?Equipment Recommendations ? None recommended by PT  ?  ?Recommendations for Other Services   ? ? ?  ?Precautions / Restrictions Precautions ?Precautions: Fall ?Precaution Comments: Home oxygen 3 lts "for years" ?Restrictions ?Weight Bearing Restrictions: No  ?  ? ?Mobility ? Bed Mobility ?  ?  ?  ?  ?  ?  ?  ?General bed mobility comments: sitting EOB ?  ? ?Transfers ?Overall transfer level: Needs assistance ?Equipment used: None ?Transfers: Sit to/from Stand ?Sit to Stand: Modified  independent (Device/Increase time) ?  ?  ?  ?  ?  ?General transfer comment: Pt mobilizing in room independently; demonstrated safely ?  ? ?Ambulation/Gait ?Ambulation/Gait assistance: Supervision ?Gait Distance (Feet): 80 Feet ?Assistive device: Straight cane ?Gait Pattern/deviations: Decreased stride length ?Gait velocity: decr ?  ?  ?General Gait Details: slow but steady, pt managing SPC to steady balance intermittently. pt SOB with 3/4 on DOE scale. ? ? ?Stairs ?  ?  ?  ?  ?  ? ? ?Wheelchair Mobility ?  ? ?Modified Rankin (Stroke Patients Only) ?  ? ? ?  ?Balance Overall balance assessment: Needs assistance ?Sitting-balance support: No upper extremity supported ?Sitting balance-Leahy Scale: Good ?  ?  ?Standing balance support: During functional activity, No upper extremity supported ?Standing balance-Leahy Scale: Good ?  ?  ?  ?  ?  ?  ?  ?  ?  ?  ?  ?  ?  ? ?  ?Cognition Arousal/Alertness: Awake/alert ?Behavior During Therapy: [facility] for tasks assessed/performed ?Overall Cognitive Status: Within Functional Limits for tasks assessed ?  ?  ?  ?  ?  ?  ?  ?  ?  ?  ?  ?  ?  ?  ?  ?  ?  ?  ?  ? ?  ?Exercises Other Exercises ?Other Exercises: 10 reps bil LE: (standing at counter for bil UE support) marching, heel raises, hip abduction ? ?  ?General Comments   ?  ?  ? ?Pertinent Vitals/Pain Pain Assessment ?Pain Assessment: No/denies pain ?Faces Pain Scale:  Hurts a little bit ?Pain Location: chronic back pain ?Pain Descriptors / Indicators: Discomfort ?Pain Intervention(s): Limited activity within patient's tolerance  ? ? ?Home Living   ?  ?  ?  ?  ?  ?  ?  ?  ?  ?   ?  ?Prior Function    ?  ?  ?   ? ?PT Goals (current goals can now be found in the care plan section) Acute Rehab PT Goals ?Patient Stated Goal: for his breathing to improve ?PT Goal Formulation: With patient ?Time For Goal Achievement: 07/02/21 ?Potential to Achieve Goals: Good ?Progress towards PT goals: Progressing toward goals ? ?  ?Frequency ? ? ? Min  3X/week ? ? ? ?  ?PT Plan Current plan remains appropriate  ? ? ?Co-evaluation   ?  ?  ?  ?  ? ?  ?AM-PAC PT "6 Clicks" Mobility   ?Outcome Measure ? Help needed turning from your back to your side while in a flat bed without using bedrails?: None ?Help needed moving from lying on your back to sitting on the side of a flat bed without using bedrails?: None ?Help needed moving to and from a bed to a chair (including a wheelchair)?: None ?Help needed standing up from a chair using your arms (e.g., wheelchair or bedside chair)?: None ?Help needed to walk in hospital room?: A Little ?Help needed climbing 3-5 steps with a railing? : A Little ?6 Click Score: 22 ? ?  ?End of Session Equipment Utilized During Treatment: Oxygen ?Activity Tolerance: Patient tolerated treatment well ?Patient left: in bed;with call bell/phone within reach ?Nurse Communication: Mobility status ?PT Visit Diagnosis: Other abnormalities of gait and mobility (R26.89) ?  ? ? ?Time: 3154-0086 ?PT Time Calculation (min) (ACUTE ONLY): 30 min ? ?Charges:  $Therapeutic Exercise: 23-37 mins          ?          ? ?Gwynneth Albright PT, DPT ?Acute Rehabilitation Services ?Office [phone] ?Pager [phone]  ? ? ?Jacques Navy ?06/21/2021, 11:54 AM ? ?

## 2021-06-21 NOTE — Consult Note (Signed)
? ?NAME:  Mario [name], MRN:  [id number], DOB:  10/05/1950, LOS: 4 ?ADMISSION DATE:  06/16/2021, CONSULTATION DATE:  06/21/2021 ?REFERRING MD:  Dr. Chiu, CHIEF COMPLAINT: shortness of breath ? 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 Mohamed. Presents to ED on 4/19 with reported 3 days of bilateral leg numbness and hypoxia with mild nonproductive cough. On arrival to ED tachycardic (HR 118), on baseline 3L [place], WBC 13, LA 2.6. CXR with patchy infiltrate in left lower lung. Started on Cefepime. Pulmonary Consulted due to non-resolving pneumonia with multiple recent admissions.  ? ?Of note patient was recently admitted 3/22-3/24 with hypoxia, CT Chest with new left sided consolidation suspicious for infection, treated with steroids, Augmentin/azithromycin, discharged on 4L [place]. Shortly after on 3/24 came back to ED with hypoxia and productive cough. Completed Augmentin/Azithromycin/Steroid course a this time. Discharged 3/27 on 3L [place]. Presented 4/6 with continued shortness of breath, reported 3-4 days of new onset productive cough and fevers. CXR with right lower lobe pneumonia and left lobe pleural effusion. Completed Cefepime course. Discharged 4/13.  ? 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 ?Significant Hospital Events: ?Including procedures, antibiotic start and stop dates in addition to other pertinent events   ?4/19 > Presents to ED with hypoxia, non-productive cough  ? ?Interim History / Subjective:  ? ?No acute complaints at this time ? ? ?Objective   ?Blood pressure 120/78, pulse 100, temperature (!) 97.4 ?F (36.3 ?C), temperature source Oral, resp. rate 20, height 6' 2" (1.88 m), weight (!) 152.1 kg, SpO2 90 %. ?    ?   ? ?Intake/Output Summary (Last 24 hours) at 06/21/2021 1356 ?Last data filed at 06/21/2021 0837 ?Gross per 24 hour  ?Intake 240 ml  ?Output 100 ml  ?Net 140 ml  ? ?Filed Weights  ? 06/16/21 1939  ?Weight: (!) 152.1 kg  ? ? ?Examination: ?General: elderly male, obese, sitting on side of bed, no acute distress ?HENT: [place]/AT, sclera anicteric, moist mucous membranes ?Lungs: clear to auscultation. Diminished LLL. No wheezing.  ?Cardiovascular: rrr, no murmurs ?Abdomen: soft, non-tender, non-distended, BS+ ?Extremities: warm, no edema ?Neuro: A&O x 3, moving all extremities ?GU: no foley ? ?Bedside US: ?Moderate left pleural effusion that appears simple in nature ? ?Resolved Hospital Problem list   ? ? ?Assessment & Plan:  ? ?Persistent Left Lower lobe infiltrate  ?Adenocarcinoma of left lung s/p SRBT, completed 09/2020 ?-CTA Chest 4/7 with increased underlying left pleural effusion, predominantly subpulmonic, may be at least partially loculated with partial fissural extension of the fluid, left lower lobe consolidation not significantly changed from 16 days prior ?-Legionella, Step P negative, COVID negative, Sputum Culture 3/24 Rare GPC  ?Plan ?- we will perform bedside thoracentesis on Wednesday 4/26 after holding eliquis for 48 hours ?- Continue cefepime  ? ?H/O PE 01/2021 ?Plan ?-Hold Eliquis  ?- start lovenox DVT Ppx ? ?Chronic Hypoxic Respiratory Failure in setting of Advanced COPD/Emphysema (on 3L [place] baseline)  ?OSA on CPAP at HS  ?Plan  ?-Titrate Supplemental Oxygen for saturation goal >88 ?-Encourage good pulmonary hygiene with IS/Flutter valve  ?-CPAP at HS  ? ?Best Practice (right click and "Reselect all SmartList Selections" daily)  ? ?Per primary ? ?Labs   ?CBC: ?Recent Labs  ?  Lab 06/17/21 ?0437 06/18/21 ?0507 06/19/21 ?0516 06/20/21 ?0524 06/21/21 ?0416  ?WBC 11.3* 11.9* 10.3 8.8 9.3  ?HGB 13.6 13.5 13.7 13.0 11.8*  ?HCT 40.8 41.6 40.8 39.4 35.3*  ?MCV 97.4 99.3 100.5* 98.0 97.5  ?PLT 268 235 244 217 214   ? ? ?Basic Metabolic Panel: ?Recent Labs  ?Lab 06/17/21 ?0437 06/18/21 ?0507 06/19/21 ?0516 06/20/21 ?0524 06/21/21 ?0416  ?NA 134* 136 132* 131* 130*  ?K 4.9 4.2 5.3* 4.5 4.7  ?CL 103 102 96* 95* 94*  ?CO2 23 26 29 29 29  ?GLUCOSE 309* 183* 175* 162* 199*  ?BUN 18 16 22 21 25*  ?CREATININE 0.99 0.89 1.12 1.04 0.84  ?CALCIUM 8.7* 8.7* 8.8* 8.8* 8.2*  ? ?GFR: ?Estimated Creatinine Clearance: 129.4 mL/min (by C-G formula based on SCr of 0.84 mg/dL). ?Recent Labs  ?Lab 06/16/21 ?2125 06/17/21 ?0437 06/17/21 ?0438 06/18/21 ?0507 06/19/21 ?0516 06/20/21 ?0524 06/21/21 ?0416  ?WBC 13.0*   < >  --  11.9* 10.3 8.8 9.3  ?LATICACIDVEN 2.6*  --  2.6*  --   --   --   --   ? < > = values in this interval not displayed.  ? ? ?Liver Function Tests: ?Recent Labs  ?Lab 06/16/21 ?2125 06/18/21 ?0507 06/19/21 ?0516 06/20/21 ?0524 06/21/21 ?0416  ?AST 19 15 33 13* 8*  ?ALT 23 20 22 18 18  ?ALKPHOS 75 62 67 74 61  ?BILITOT 0.3 0.3 1.5* 0.4 0.4  ?PROT 6.7 6.2* 6.5 6.2* 5.7*  ?ALBUMIN 3.6 3.3* 3.5 3.3* 3.1*  ? ?No results for input(s): LIPASE, AMYLASE in the last 168 hours. ?No results for input(s): AMMONIA in the last 168 hours. ? ?ABG ?   ?Component Value Date/Time  ? PHART 7.47 (H) 06/04/2021 0005  ? PCO2ART 37 06/04/2021 0005  ? PO2ART 70 (L) 06/04/2021 0005  ? HCO3 25.5 06/16/2021 2125  ? TCO2 25 06/16/2021 2149  ? ACIDBASEDEF 3.3 (H) 06/04/2021 0810  ? O2SAT 95.5 06/16/2021 2125  ?  ? ?Coagulation Profile: ?No results for input(s): INR, PROTIME in the last 168 hours. ? ?Cardiac Enzymes: ?No results for input(s): CKTOTAL, CKMB, CKMBINDEX, TROPONINI in the last 168 hours. ? ?HbA1C: ?Hgb A1c MFr Bld  ?Date/Time Value Ref Range Status  ?05/22/2021 03:35 AM 7.6 (H) 4.8 - 5.6 % Final  ?  Comment:  ?  (NOTE) ?Pre diabetes:          5.7%-6.4% ? ?Diabetes:              >6.4% ? ?Glycemic control for   <7.0% ?adults with diabetes ?  ?01/09/2021 07:56 AM 7.0 (H) 4.8 - 5.6 % Final  ?  Comment:  ?  (NOTE) ?Pre diabetes:           5.7%-6.4% ? ?Diabetes:              >6.4% ? ?Glycemic control for   <7.0% ?adults with diabetes ?  ? ? ?CBG: ?Recent Labs  ?Lab 06/20/21 ?0750 06/20/21 ?1134 06/20/21 ?1636 06/21/21 ?0825 06/21/21 ?1228  ?GLUCAP 158* 125* 130* 204* 140*  ? ? ?Review of Systems:   ?Review of Systems  ?Constitutional:  Negative for chills, fever, malaise/fatigue and weight loss.  ?HENT:  Negative for congestion, sinus pain and sore throat.   ?Eyes: Negative.   ?Respiratory:  Positive for cough and shortness of breath. Negative for hemoptysis, sputum production and wheezing.   ?Cardiovascular:  Negative for chest pain, palpitations, orthopnea, claudication and leg swelling.  ?Gastrointestinal:  Negative   for abdominal pain, heartburn, nausea and vomiting.  ?Genitourinary: Negative.   ?Musculoskeletal:  Negative for joint pain and myalgias.  ?Skin:  Negative for rash.  ?Neurological:  Negative for weakness.  ?Endo/Heme/Allergies: Negative.   ?Psychiatric/Behavioral: Negative.    ? ? ?Past Medical History:  ?He,  has a past medical history of Anxiety, Bronchitis, COPD (chronic obstructive pulmonary disease) ([place]), Depression, History of radiation therapy, Hypertension, lung ca (09/2020), Mental disorder, MI (myocardial infarction) ([place]), OSA (obstructive sleep apnea), Suicide attempt ([place]), Tension pneumothorax (06/27/2016), and Uveitis.  ? ?Surgical History:  ? ?Past Surgical History:  ?Procedure Laterality Date  ? BRONCHIAL BIOPSY  07/30/2020  ? Procedure: BRONCHIAL BIOPSIES;  Surgeon: Garner Nash, DO;  Location: [facility];  Service: Pulmonary;;  ? BRONCHIAL BRUSHINGS  07/30/2020  ? Procedure: BRONCHIAL BRUSHINGS;  Surgeon: Garner Nash, DO;  Location: [facility];  Service: Pulmonary;;  ? BRONCHIAL NEEDLE ASPIRATION BIOPSY  07/30/2020  ? Procedure: BRONCHIAL NEEDLE ASPIRATION BIOPSIES;  Surgeon: Garner Nash, DO;  Location: [facility];  Service: Pulmonary;;  ? BRONCHIAL WASHINGS  07/30/2020  ? Procedure: BRONCHIAL WASHINGS;   Surgeon: Garner Nash, DO;  Location: [facility];  Service: Pulmonary;;  ? CHEST TUBE INSERTION Left 06/27/2016  ? cryptorchidism    ? SKIN CANCER EXCISION    ? VIDEO BRONCHOSCOPY WITH ENDOBRONCHIAL NAVIGATION

## 2021-06-21 NOTE — Progress Notes (Signed)
?  Progress Note ? ? ?Patient: Mario [name] ENI:[id number] DOB: 1950/07/06 DOA: 06/16/2021     4 ?DOS: the patient was seen and examined on 06/21/2021 ?  ?Brief hospital course: ?71 y.o. 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 ? ?He was given IV vancomycin and cefepime with Solu-Medrol in the ED.  Hospitalist called for admission for management of left lower lobe pneumonia seen on imaging. ? ?Assessment and Plan: ?Acute on chronic respiratory failure ([place]) ?Baseline on 3L with reported hypoxia to 76% at home. CXR findings of Left lower lobe pneumonia ?-continue IV cefepime. MRSA PCR negative recently ?-test for strep and Legionella urine antigen ?-Flutter valve and incentive spirometry ?-Now on baseline O2 requirements, ?-Continues with increased dyspnea on even minimal exertion ?-Repeat CXR reviewed, findings of persistent opacity in L lower lung fields, essentially unchanged ?-Consulted Pulmonary. Recs for bedside thoracentesis 4/26 ? ?Sepsis ([place]) ?Presented with tachycardia, leukocytosis with chest x-ray finding of pneumonia ?-cont on above abx ? ?Leg paresthesia ?Reports acute bilateral feet paresthesia for past 3 days. strong dorsalis pedis pulses heard on bedside doppler.  ?-Numbness noted primarily at soles of B feet. Reports of neuropathy and had been on 900mg  neurontin TID, however was cut back to 900mg  qhs per his behavioral provider ?-ABI's reviewed, findings of mild RLE arterial disease ?-Vitamin I14 4315, folic acid 40.0  ?-[id number] suggests fair control at 7.6  ?-Possible underlying diabetic neuropathy ?-ambulated at baseline per PT ?-added neuronin 300mg  daily in addition to 900mg  qhs without much effect. Will increase to 300mg  BID with continued 900mg   qhs ? ?Obesity, Class III, BMI 40-49.9 (morbid obesity) ([place]) ?BMI 43 ?Recommend diet/lifestyle modification ? ?History of pulmonary embolism ?On eliquis at home, on hold now in anticipation for upcoming thoracentesis ?No evidence of bleed ?Would resume eliquis when OK with Pulmonary ? ?Non-small cell cancer of left lung ([place]) ?S/p SBRT  ?Currently on surveillance with oncology Dr. Earlie Server ? ? ?OSA (obstructive sleep apnea) ?Declined hospital CPAP ? ?DM2 (diabetes mellitus, type 2) ([place]) ?Hemoglobin A1c of 7.6 in March ?Glycemic trends stable ?Cont SSI ? ? ? ? ?  ? ?Subjective: Still feeling sob on walking to bathroom ? ?Physical Exam: ?Vitals:  ? 06/20/21 2119 06/21/21 0507 06/21/21 0830 06/21/21 1500  ?BP: 132/79 120/78  128/76  ?Pulse: 84 100  89  ?Resp: 16 20  20   ?Temp: 98 ?F (36.7 ?C) (!) 97.4 ?F (36.3 ?C)  97.8 ?F (36.6 ?C)  ?TempSrc: Oral Oral  Oral  ?SpO2: 97% 91% 90% 93%  ?Weight:      ?Height:      ? ?General exam: Awake, laying in bed, in nad ?Respiratory system: Normal respiratory effort, no wheezing ?Cardiovascular system: regular rate, s1, s2 ?Gastrointestinal system: Soft, nondistended, positive BS ?Central nervous system: CN2-12 grossly intact, strength intact ?Extremities: Perfused, no clubbing ?Skin: Normal skin turgor, no notable skin lesions seen ?Psychiatry: Mood normal // no visual hallucinations  ? ?Data Reviewed: ? ?Labs reviewed: Na 130, K 4.7, Cr 0.84 ? ?Family Communication: Pt in room, family not at bedside ? ?Disposition: ?Status is: Inpatient ?Requires inpatient stay because: Severity of illness ? Planned Discharge Destination: Home ? ? ? ?Author: ?Marylu Lund, MD ?06/21/2021 4:02 PM ? ?For on call review [email or web address]  ?

## 2021-06-22 DIAGNOSIS — J441 Chronic obstructive pulmonary disease with (acute) exacerbation: Secondary | ICD-10-CM | POA: Diagnosis not present

## 2021-06-22 DIAGNOSIS — J189 Pneumonia, unspecified organism: Secondary | ICD-10-CM | POA: Diagnosis not present

## 2021-06-22 DIAGNOSIS — Z86711 Personal history of pulmonary embolism: Secondary | ICD-10-CM | POA: Diagnosis not present

## 2021-06-22 LAB — GLUCOSE, CAPILLARY
Glucose-Capillary: 187 mg/dL — ABNORMAL HIGH (ref 70–99)
Glucose-Capillary: 113 mg/dL — ABNORMAL HIGH (ref 70–99)
Glucose-Capillary: 122 mg/dL — ABNORMAL HIGH (ref 70–99)
Glucose-Capillary: 143 mg/dL — ABNORMAL HIGH (ref 70–99)

## 2021-06-22 LAB — CBC
HCT: 36 % — ABNORMAL LOW (ref 39.0–52.0)
Hemoglobin: 11.8 g/dL — ABNORMAL LOW (ref 13.0–17.0)
MCH: 32.2 pg (ref 26.0–34.0)
MCHC: 32.8 g/dL (ref 30.0–36.0)
MCV: 98.1 fL (ref 80.0–100.0)
Platelets: 234 10*3/uL (ref 150–400)
RBC: 3.67 MIL/uL — ABNORMAL LOW (ref 4.22–5.81)
RDW: 13.4 % (ref 11.5–15.5)
WBC: 9 10*3/uL (ref 4.0–10.5)
nRBC: 0 % (ref 0.0–0.2)

## 2021-06-22 LAB — COMPREHENSIVE METABOLIC PANEL
ALT: 18 U/L (ref 0–44)
AST: 15 U/L (ref 15–41)
Albumin: 3.1 g/dL — ABNORMAL LOW (ref 3.5–5.0)
Alkaline Phosphatase: 59 U/L (ref 38–126)
Anion gap: 5 (ref 5–15)
BUN: 26 mg/dL — ABNORMAL HIGH (ref 8–23)
CO2: 29 mmol/L (ref 22–32)
Calcium: 8.4 mg/dL — ABNORMAL LOW (ref 8.9–10.3)
Chloride: 94 mmol/L — ABNORMAL LOW (ref 98–111)
Creatinine, Ser: 0.71 mg/dL (ref 0.61–1.24)
GFR, Estimated: >60 mL/min (ref >60)
Glucose, Bld: 133 mg/dL — ABNORMAL HIGH (ref 70–99)
Potassium: 4.7 mmol/L (ref 3.5–5.1)
Sodium: 128 mmol/L — ABNORMAL LOW (ref 135–145)
Total Bilirubin: 0.3 mg/dL (ref 0.3–1.2)
Total Protein: 5.9 g/dL — ABNORMAL LOW (ref 6.5–8.1)

## 2021-06-22 LAB — CULTURE, BLOOD (ROUTINE X 2)
Culture: NO GROWTH
Culture: NO GROWTH
Special Requests: ADEQUATE
Special Requests: ADEQUATE

## 2021-06-22 MED ORDER — METHOCARBAMOL 1000 MG/10ML IJ SOLN
500.0000 mg | Freq: Three times a day (TID) | INTRAVENOUS | Status: DC | PRN
  Administered 2021-06-22 – 2021-06-23 (×2): 500 mg via INTRAVENOUS
  Filled 2021-06-22 (×2): qty 500
  Filled 2021-06-22: qty 5

## 2021-06-22 MED ORDER — ONDANSETRON HCL 4 MG/2ML IJ SOLN
4.0000 mg | Freq: Four times a day (QID) | INTRAMUSCULAR | Status: DC | PRN
  Administered 2021-06-22 – 2021-06-28 (×3): 4 mg via INTRAVENOUS
  Filled 2021-06-22 (×3): qty 2

## 2021-06-22 NOTE — Assessment & Plan Note (Signed)
-  Sodium down to 128 this AM ?-Appears euvolemic on exam ?-Will check serum and urine osm and urine Na ?-Repeat bmet in AM ?

## 2021-06-22 NOTE — Progress Notes (Incomplete)
[place] Note:  ?Pt here with HCAP, on antibioitics. HR is only SIRS criteria.  ?VS protocol started.  ?Charge RN aware, will discuss with RRTRN if any further changes occur.  ? ? ?

## 2021-06-22 NOTE — Progress Notes (Signed)
[place] Note:  ?Pt here with HCAP, on antibioitics. HR is only SIRS criteria.  ?VS protocol started.  ?Charge RN aware, will discuss with RRTRN if any further changes occur.  ? ? 06/22/21 1330  ?Assess: MEWS Score  ?Temp 97.9 ?F (36.6 ?C)  ?BP 97/61  ?Pulse Rate (!) 110  ?Resp 20  ?Level of Consciousness Alert  ?SpO2 95 %  ?O2 Device Nasal Cannula  ?O2 Flow Rate (L/min) 3 L/min  ?Assess: MEWS Score  ?MEWS Temp 0  ?MEWS Systolic 1  ?MEWS Pulse 1  ?MEWS RR 0  ?MEWS LOC 0  ?MEWS Score 2  ?MEWS Score Color Yellow  ?Assess: if the MEWS score is Yellow or Red  ?Were vital signs taken at a resting state? Yes  ?Focused Assessment No change from prior assessment  ?Does the patient meet 2 or more of the SIRS criteria? No  ?Does the patient have a confirmed or suspected source of infection? Yes  ?Provider and Rapid Response Notified? No  ?MEWS guidelines implemented *See Row Information* Yes  ?Treat  ?MEWS Interventions Other (Comment);Administered scheduled meds/treatments ?(stable on current regimen. breathing treatments, IS/FV. Also treated for pain.)  ?Pain Score 6  ?Pain Type Chronic pain  ?Take Vital Signs  ?Increase Vital Sign Frequency  Yellow: Q 2hr X 2 then Q 4hr X 2, if remains yellow, continue Q 4hrs  ?Escalate  ?MEWS: Escalate Yellow: discuss with charge nurse/RN and consider discussing with provider and RRT  ?Notify: Charge Nurse/RN  ?Name of Charge Nurse/RN Notified Wayna Chalet. RN  ?Date Charge Nurse/RN Notified 06/22/21  ?Time Charge Nurse/RN Notified 1335  ?Document  ?Patient Outcome Stabilized after interventions  ?Progress note created (see row info) Yes  ?Assess: SIRS CRITERIA  ?SIRS Temperature  0  ?SIRS Pulse 1  ?SIRS Respirations  0  ?SIRS WBC 0  ?SIRS Score Sum  1  ? ?Phineas Real, BSN, RN-BC  ?

## 2021-06-22 NOTE — Plan of Care (Signed)
Pt aox4, cooperative and ad lib in room.  ?IV abx and pain management per orders.  ?Pulm following, plans for thoracentesis tomorrow.  ? ?Problem: Education: ?Goal: Knowledge of General Education information will improve ?Description: Including pain rating scale, medication(s)/side effects and non-pharmacologic comfort measures ?Outcome: Progressing ?  ?Problem: Health Behavior/Discharge Planning: ?Goal: Ability to manage health-related needs will improve ?Outcome: Progressing ?  ?Problem: Clinical Measurements: ?Goal: Ability to maintain clinical measurements within normal limits will improve ?Outcome: Progressing ?Goal: Will remain free from infection ?Outcome: Progressing ?Goal: Diagnostic test results will improve ?Outcome: Progressing ?Goal: Respiratory complications will improve ?Outcome: Progressing ?Goal: Cardiovascular complication will be avoided ?Outcome: Progressing ?  ?Problem: Activity: ?Goal: Risk for activity intolerance will decrease ?Outcome: Progressing ?  ?Problem: Nutrition: ?Goal: Adequate nutrition will be maintained ?Outcome: Progressing ?  ?Problem: Coping: ?Goal: Level of anxiety will decrease ?Outcome: Progressing ?  ?Problem: Elimination: ?Goal: Will not experience complications related to bowel motility ?Outcome: Progressing ?Goal: Will not experience complications related to urinary retention ?Outcome: Progressing ?  ?Problem: Pain Managment: ?Goal: General experience of comfort will improve ?Outcome: Progressing ?  ?Problem: Safety: ?Goal: Ability to remain free from injury will improve ?Outcome: Progressing ?  ?Problem: Skin Integrity: ?Goal: Risk for impaired skin integrity will decrease ?Outcome: Progressing ?  ?Problem: Activity: ?Goal: Ability to tolerate increased activity will improve ?Outcome: Progressing ?  ?Problem: Clinical Measurements: ?Goal: Ability to maintain a body temperature in the normal range will improve ?Outcome: Progressing ?  ?Problem: Respiratory: ?Goal:  Ability to maintain adequate ventilation will improve ?Outcome: Progressing ?Goal: Ability to maintain a clear airway will improve ?Outcome: Progressing ?  ?

## 2021-06-22 NOTE — Progress Notes (Signed)
?  Progress Note ? ? ?Patient: Mario [name] QPY:[id number] DOB: [birth year] DOA: 06/16/2021     5 ?DOS: the patient was seen and examined on 06/22/2021 ?  ?Brief hospital course: ?71 y.o. 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 ? ?He was given IV vancomycin and cefepime with Solu-Medrol in the ED.  Hospitalist called for admission for management of left lower lobe pneumonia seen on imaging. ? ?Assessment and Plan: ?Acute on chronic respiratory failure ([place]) ?Baseline on 3L with reported hypoxia to 76% at home. CXR findings of Left lower lobe pneumonia ?-continue IV cefepime. MRSA PCR negative recently ?-Flutter valve and incentive spirometry ?-Continues with increased dyspnea on even minimal exertion ?-Repeat CXR reviewed, findings of persistent opacity in L lower lung fields, essentially unchanged ?-Consulted Pulmonary. Recs for bedside thoracentesis 4/26 ?-Oncology following, recommending sending fluid for cytology as well ? ?Sepsis ([place]) ?Presented with tachycardia, leukocytosis with chest x-ray finding of pneumonia ?-cont on above abx ? ?Leg paresthesia ?Reports acute bilateral feet paresthesia for past 3 days prior to admit. Strong dorsalis pedis pulses heard on bedside doppler.  ?-Numbness noted primarily at soles of B feet.  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 ?Obesity, Class III, BMI 40-49.9 (morbid obesity) ([place]) ?BMI 43 ?Recommend diet/lifestyle modification ? ?History of pulmonary embolism ?On eliquis at home, on hold now  in anticipation for upcoming thoracentesis on 4/26 ?No evidence of bleed ?Would resume eliquis when OK with Pulmonary ? ?Hyponatremia ?-Sodium down to 128 this AM ?-Appears euvolemic on exam ?-Will check serum and urine osm and urine Na ?-Repeat bmet in AM ? ?Non-small cell cancer of left lung ([place]) ?S/p SBRT  ?Currently on surveillance with oncology Dr. Earlie Server ? ? ?OSA (obstructive sleep apnea) ?Declined hospital CPAP ? ?DM2 (diabetes mellitus, type 2) ([place]) ?Hemoglobin A1c of 7.6 in March ?Glycemic trends stable ?Cont SSI ? ? ? ? ?  ? ?Subjective: Complaining of neck and low back pains not relieved with current regimen ? ?Physical Exam: ?Vitals:  ? 06/22/21 0319 06/22/21 0803 06/22/21 1330 06/22/21 1600  ?BP: 118/70  97/61 116/65  ?Pulse: 87  (!) 110 (!) 113  ?Resp: 15  20 20   ?Temp: (!) 97.5 ?F (36.4 ?C)  97.9 ?F (36.6 ?C) (!) 97.5 ?F (36.4 ?C)  ?TempSrc: Oral  Oral Oral  ?SpO2: 99% 96% 95% 94%  ?Weight:      ?Height:      ? ?General exam: Conversant, in no acute distress ?Respiratory system: normal chest rise, clear, no audible wheezing ?Cardiovascular system: regular rhythm, s1-s2 ?Gastrointestinal system: Nondistended, nontender, pos BS ?Central nervous system: No seizures, no tremors ?Extremities: No cyanosis, no joint deformities ?Skin: No rashes, no pallor ?Psychiatry: Affect normal // no auditory hallucinations  ? ?Data Reviewed: ? ?Labs reviewed: Na 128, K 4.7, Cr 0.71 ? ?Family Communication: Pt in room, family not at bedside ? ?Disposition: ?Status is: Inpatient ?Requires inpatient stay because: Severity of illness ? Planned Discharge Destination: Home ? ? ? ?Author: ?Marylu Lund, MD ?06/22/2021 5:23 PM ? ?For on call review [email or web address]  ?

## 2021-06-22 NOTE — Progress Notes (Signed)
Pt stating he is still unsatisfied with pain management at this time despite increases throughout day by MD. Reports continued 7/10 back and neck pain after medications.   ?Regimen discussed in depth with patient at bedside including PRNs and non-pharmacological options. Charge nurse, Pamala Hurry, also went in to speak with patient.  ? ?MD notified. Okay to given night time PRNs early if necessary with available Toradol but no further medication changes ordered.  ? ?Night Shift RN updated on situation and will monitor patient throughout the evening.  ? ?

## 2021-06-22 NOTE — TOC Initial Note (Deleted)
Transition of Care (TOC) - Initial/Assessment Note  ? ? ?Patient Details  ?Name: Mario [name] ?MRN: [id number] ?Date of Birth: 1950/03/17 ? ?Transition of Care (TOC) CM/SW Contact:    ?Ross Ludwig, LCSW ?Phone Number: ?06/20/21 4:20pm ? ?Clinical Narrative:                 ? ?Patient is currently open to [facility] for home health services.  Patient is a 71 year old male who is alert and oriented x4 and lives with a friend.  Patient was seen by PT and they are recommending he continue to receive home health services.  CSW updated [name] that patient is in hospital.  CSW to continue to follow patient's progress throughout discharge planning. ? ?Expected Discharge Plan: [place] ?Barriers to Discharge: Continued Medical Work up ? ? ?Patient Goals and CMS Choice ?Patient states their goals for this hospitalization and ongoing recovery are:: To return back home with home health PT. ?CMS Medicare.gov Compare Post Acute Care list provided to:: Patient ?Choice offered to / list presented to : Patient ? ?Expected Discharge Plan and Services ?Expected Discharge Plan: [place] ?  ?  ?Post Acute Care Choice: Home Health ?Living arrangements for the past 2 months: [place] ?                ?  ?  ?  ?  ?  ?HH Arranged: PT ?[place] Agency: [facility] ?Date HH Agency Contacted: 06/20/21 ?Time [place]: [phone] ?Representative spoke with at [facility]: [name] ? ?Prior Living Arrangements/Services ?Living arrangements for the past 2 months: [place] ?Lives with:: Friends ?Patient language and need for interpreter reviewed:: Yes ?Do you feel safe going back to the place where you live?: Yes      ?Need for Family Participation in Patient Care: No (Comment) ?Care giver support system in place?: No (comment) ?Current home services: Home PT ?Criminal Activity/Legal Involvement Pertinent to Current Situation/Hospitalization: No - Comment as  needed ? ?Activities of Daily Living ?Home Assistive Devices/Equipment: Bedside commode/3-in-1, Cane (specify quad or straight), Oxygen ?ADL Screening (condition at time of admission) ?Patient's cognitive ability adequate to safely complete daily activities?: No ?Is the patient deaf or have difficulty hearing?: No ?Does the patient have difficulty seeing, even when wearing glasses/contacts?: No ?Does the patient have difficulty concentrating, remembering, or making decisions?: No ?Patient able to express need for assistance with ADLs?: Yes ?Does the patient have difficulty dressing or bathing?: No ?Independently performs ADLs?: No ?Communication: Independent ?Dressing (OT): Independent ?Grooming: Independent ?Feeding: Independent ?Bathing: Needs assistance ?Is this a change from baseline?: Pre-admission baseline ?Toileting: Needs assistance ?Is this a change from baseline?: Pre-admission baseline ?In/Out Bed: Independent ?Walks in Home: Independent ?Does the patient have difficulty walking or climbing stairs?: Yes ?Weakness of Legs: Both ?Weakness of Arms/Hands: None ? ?Permission Sought/Granted ?Permission sought to share information with : Case Manager, Family Supports ?Permission granted to share information with : Yes, Verbal Permission Granted ? Share Information with NAME: Manuella Ghazi [phone]  [phone]  CRIOLLO,BOBBI Daughter [phone]  [phone]  [facility] Brother [phone]  [phone] ? Permission granted to share info w AGENCY: Home health agencies ?   ?   ? ?Emotional Assessment ?Appearance:: Appears stated age ?  ?Affect (typically observed): Calm, Appropriate, Accepting, Stable ?Orientation: : Oriented to Self, Oriented to Place, Oriented to  Time, Oriented to Situation ?Alcohol / Substance Use: Not Applicable ?Psych Involvement: No (  comment) ? ?Admission diagnosis:  COPD exacerbation ([place]) [J44.1] ?HCAP (healthcare-associated pneumonia) [J18.9] ?Pneumonia [J18.9] ?Patient  Active Problem List  ? Diagnosis Date Noted  ? Leg paresthesia 06/17/2021  ? Sepsis ([place]) 06/17/2021  ? Obesity, Class III, BMI 40-49.9 (morbid obesity) ([place]) 06/17/2021  ? Pneumonia 06/17/2021  ? Acute respiratory failure with hypoxia ([place]) 06/04/2021  ? Acute on chronic respiratory failure with hypoxia ([place]) 06/04/2021  ? Hypertension   ? Allergic rhinitis   ? History of pulmonary embolism   ? Acute hypoxemic respiratory failure ([place]) 05/21/2021  ? Hyponatremia 05/20/2021  ? Acute pulmonary embolism without acute cor pulmonale ([place]) 02/03/2021  ? Mood disorder ([place]) 02/03/2021  ? Acute on chronic respiratory failure ([place]) 01/09/2021  ? Non-small cell cancer of left lung ([place]) 09/21/2020  ? Lung nodule 07/30/2020  ? Mass of left lung   ? Acute respiratory failure with hypoxemia ([place]) 07/26/2020  ? Constipation   ? HCAP (healthcare-associated pneumonia) 04/03/2020  ? COPD with acute exacerbation ([place]) 01/05/2020  ? Vertigo 12/26/2019  ? 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 Stage 3a chronic kidney disease ([place]) 12/26/2019  ? Abnormal CT of the chest 12/26/2019  ? COPD (chronic obstructive pulmonary disease) ([place])   ? AKI (acute kidney injury) ([place])   ? Hyperglycemia 06/23/2019  ? OSA (obstructive sleep apnea) 06/23/2019  ? Suicidal ideation 06/23/2019  ? Major depressive disorder, recurrent episode ([place]) 10/12/2018  ? 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 Cellulitis of both feet 07/16/2017  ? DM2 (diabetes mellitus, type 2) ([place]) 07/16/2017  ? HLA B27 (HLA B27 positive) 07/16/2017  ? Acute chest pain   ? Hyperkalemia   ? Spontaneous pneumothorax 06/27/2016  ? Hypoxia   ? Pneumothorax on left   ? Dyspnea 05/23/2014  ? COPD exacerbation ([place]) 05/23/2014  ? Malingering 01/21/2013  ? Depression 01/11/2013  ? Tobacco abuse 01/11/2013  ? Obesity, unspecified 01/11/2013  ? Chest pain 01/10/2013  ? Hypertension associated with diabetes ([place]) 01/10/2013  ? ?PCP:  [facility] ?Pharmacy:   ?[facility] [address] ?[phone] ?[place] [address] ?Phone: [phone] Fax: [phone] ? ? ? ? ?Social Determinants of Health (SDOH) Interventions ?  ? ?Readmission Risk Interventions ? ?  06/07/2021  ?  4:15 PM  ?Readmission Risk Prevention Plan  ?Transportation Screening Complete  ?Medication Review Press photographer) Complete  ?PCP or Specialist appointment within 3-5 days of discharge Complete  ?[place] or Home Care Consult Complete  ?SW Recovery Care/Counseling Consult Complete  ?Palliative Care Screening Not Applicable  ?[place] Not Applicable  ? ? ? ?

## 2021-06-22 NOTE — Progress Notes (Signed)
Brief Oncology note: ? ?We were made aware of the patient's admission. Chart has been reviewed. He has a diagnosis of Stage IIb  (T3, N0, M0) non-small cell lung cancer, adenocarcinoma presented with left lower lobe lung mass diagnosed in June 2022.  The patient also has suspicious pleural-based nodule and small left pleural effusion on the recent PET scan that could be concerning for metastatic disease but not completely confirmed at this point. Completed SBRT to the left   lower lobe lung mass under the care of Dr. Sondra Come completed on 10/15/2020. Currently on observation. ? ?Admitted for acute on chronic respiratory failure and sepsis. PCCM planning for thoracentesis for loculated L pleural effusion tomorrow. ? ?CT reviewed and discussed with Dr. Julien Nordmann. CT from 05/19/2021 and 06/04/2021 does not show clearcut evidence of disease progression. Agree with thoracentesis. Please send fluid for cytology. If there is evidence of malignancy, we will discuss with the patient and determine treatment options.  ? ?The patient currently has outpatient follow up with Dr Julien Nordmann on 07/20/2021 and will keep this appointment as scheduled. We can move this appointment up if needed if cytology positive for malignant cells.  ? ?[name], DNP, AGPCNP-BC, AOCNP ? ?Future Appointments  ?Date Time Provider [place]  ?07/14/2021  9:30 AM [facility] None  ?07/20/2021  9:30 AM Curt Bears, MD [facility] None  ?  ?

## 2021-06-23 ENCOUNTER — Inpatient Hospital Stay (HOSPITAL_COMMUNITY): Payer: No Typology Code available for payment source

## 2021-06-23 ENCOUNTER — Encounter (HOSPITAL_COMMUNITY): Payer: Self-pay | Admitting: Internal Medicine

## 2021-06-23 DIAGNOSIS — J189 Pneumonia, unspecified organism: Secondary | ICD-10-CM | POA: Diagnosis not present

## 2021-06-23 LAB — GLUCOSE, CAPILLARY: Glucose-Capillary: 175 mg/dL — ABNORMAL HIGH (ref 70–99)

## 2021-06-23 LAB — BODY FLUID CELL COUNT WITH DIFFERENTIAL
Eos, Fluid: 2 %
Lymphs, Fluid: 43 %
Monocyte-Macrophage-Serous Fluid: 54 % (ref 50–90)
Neutrophil Count, Fluid: 1 % (ref 0–25)
Total Nucleated Cell Count, Fluid: 3024 cu mm — ABNORMAL HIGH (ref 0–1000)

## 2021-06-23 LAB — SODIUM, URINE, RANDOM: Sodium, Ur: 25 mmol/L

## 2021-06-23 LAB — CBC
HCT: 31.4 % — ABNORMAL LOW (ref 39.0–52.0)
Hemoglobin: 10.5 g/dL — ABNORMAL LOW (ref 13.0–17.0)
MCH: 33.3 pg (ref 26.0–34.0)
MCHC: 33.4 g/dL (ref 30.0–36.0)
MCV: 99.7 fL (ref 80.0–100.0)
Platelets: 248 10*3/uL (ref 150–400)
RBC: 3.15 MIL/uL — ABNORMAL LOW (ref 4.22–5.81)
RDW: 13.8 % (ref 11.5–15.5)
WBC: 9.6 10*3/uL (ref 4.0–10.5)
nRBC: 0 % (ref 0.0–0.2)

## 2021-06-23 LAB — ALBUMIN: Albumin: 3.8 g/dL (ref 3.5–5.0)

## 2021-06-23 LAB — COMPREHENSIVE METABOLIC PANEL
ALT: 19 U/L (ref 0–44)
AST: 16 U/L (ref 15–41)
Albumin: 3.5 g/dL (ref 3.5–5.0)
Alkaline Phosphatase: 55 U/L (ref 38–126)
Anion gap: 9 (ref 5–15)
BUN: 44 mg/dL — ABNORMAL HIGH (ref 8–23)
CO2: 24 mmol/L (ref 22–32)
Calcium: 8.7 mg/dL — ABNORMAL LOW (ref 8.9–10.3)
Chloride: 96 mmol/L — ABNORMAL LOW (ref 98–111)
Creatinine, Ser: 1.16 mg/dL (ref 0.61–1.24)
GFR, Estimated: >60 mL/min (ref >60)
Glucose, Bld: 162 mg/dL — ABNORMAL HIGH (ref 70–99)
Potassium: 4.5 mmol/L (ref 3.5–5.1)
Sodium: 129 mmol/L — ABNORMAL LOW (ref 135–145)
Total Bilirubin: 0.5 mg/dL (ref 0.3–1.2)
Total Protein: 6.2 g/dL — ABNORMAL LOW (ref 6.5–8.1)

## 2021-06-23 LAB — LACTATE DEHYDROGENASE: LDH: 258 U/L — ABNORMAL HIGH (ref 98–192)

## 2021-06-23 LAB — OSMOLALITY, URINE: Osmolality, Ur: 697 mOsm/kg (ref 300–900)

## 2021-06-23 LAB — LACTATE DEHYDROGENASE, PLEURAL OR PERITONEAL FLUID: LD, Fluid: 239 U/L — ABNORMAL HIGH (ref 3–23)

## 2021-06-23 LAB — GLUCOSE, PLEURAL OR PERITONEAL FLUID: Glucose, Fluid: 127 mg/dL

## 2021-06-23 LAB — ALBUMIN, PLEURAL OR PERITONEAL FLUID: Albumin, Fluid: 2.6 g/dL

## 2021-06-23 LAB — PROTEIN, PLEURAL OR PERITONEAL FLUID: Total protein, fluid: 4.1 g/dL

## 2021-06-23 LAB — OSMOLALITY: Osmolality: 286 mOsm/kg (ref 275–295)

## 2021-06-23 LAB — PROTEIN, TOTAL: Total Protein: 7.2 g/dL (ref 6.5–8.1)

## 2021-06-23 MED ORDER — SODIUM CHLORIDE (PF) 0.9 % IJ SOLN
INTRAMUSCULAR | Status: AC
  Filled 2021-06-23: qty 50

## 2021-06-23 MED ORDER — APIXABAN 5 MG PO TABS
5.0000 mg | ORAL_TABLET | Freq: Two times a day (BID) | ORAL | Status: DC
  Administered 2021-06-23 – 2021-06-29 (×12): 5 mg via ORAL
  Filled 2021-06-23 (×12): qty 1

## 2021-06-23 MED ORDER — IOHEXOL 300 MG/ML  SOLN: 75.0000 mL | Freq: Once | INTRAMUSCULAR | Status: DC | PRN

## 2021-06-23 MED ORDER — IOHEXOL 300 MG/ML  SOLN
75.0000 mL | Freq: Once | INTRAMUSCULAR | Status: AC | PRN
  Administered 2021-06-23: 75 mL via INTRAVENOUS

## 2021-06-23 NOTE — Progress Notes (Signed)
?Progress Note ? ? ?Patient: Mario [name] GYJ:[id number] DOB: [birth year] DOA: 06/16/2021     6 ?DOS: the patient was seen and examined on 06/23/2021 ?  ?Brief hospital course: ? ?71 y.o. 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 ? ?He was given IV vancomycin and cefepime with Solu-Medrol in the ED.  Hospitalist called for admission for management of left lower lobe pneumonia seen on imaging. ? ?Assessment and Plan: ? ?Acute on chronic respiratory failure ([place]) ?Baseline on 3L with reported hypoxia to 76% at home. CXR findings of Left lower lobe pneumonia/pleural effusion ?-continue IV cefepime. MRSA PCR negative recently ?--Consulted Pulmonary. Recs for bedside thoracentesis 4/26  ?-Oncology following, recommending sending fluid for cytology as well ? ?Sepsis ([place]) ?Presented with tachycardia, leukocytosis with chest x-ray finding of pneumonia ?-cont on above abx ?Blood culture no growth, awaiting for pleural fluid culture ? ?HTN ?Presents with sepsis, continue cardizem , hold lisinopril ? ? ?Hyponatremia ?-Sodium down to 128 this AM ?-Appears euvolemic on exam ?-urine osmolarity is elevated, urine sodium is 25,  ?-he takes Cymbalta and Trileptal which could contribute to hyponatremia will discuss with patient ?--Repeat bmet in AM ? ?Non-small cell cancer of left lung ([place]), stage III at diagnosis ?Completed SBRT to the left   lower lobe lung mass under the care of Dr. Sondra Come completed on 10/15/2020. Currently on observation. ?Next appointment with oncology Dr. Earlie Server 5/23, per oncology note from 4/15 "can move this appointment up if needed if cytology positive for malignant cells. " ? ?History of pulmonary embolism ?On eliquis at home, on hold for thoracentesis on 4/26 ?No  evidence of bleed ? Pulmonary oked to resume eliquis after thoracentesis ? ? ?DM2 (diabetes mellitus, type 2) ([place]), on metformin at home ?Hemoglobin A1c of 7.6 in March ?Glycemic trends stable ?Cont SSI ? ?Leg paresthesia ?Reports acute bilateral feet paresthesia for past 3 days prior to admit. Strong dorsalis pedis pulses heard on bedside doppler.  ?-Numbness noted primarily at soles of B feet.  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 ?OSA (obstructive sleep apnea) ?Declined hospital CPAP ?Obesity, Class III, BMI 40-49.9 (morbid obesity) ([place]) ?BMI 43 ?Recommend diet/lifestyle modification ? ? ? ? ? ?  ? ?Subjective:  ? ?He is seen after return from thoracentesis ,sitting up in bed, currently denies pain, currently on 3 L of oxygen supplement ? ?Date reviewed: ?BP low normal, hold lisinopril ?Hyponatremia ?Hgb 10.5, WBC 9.6 ? ?Physical Exam: ?Vitals:  ? 06/22/21 2130 06/22/21 2131 06/22/21 2137 06/23/21 0122  ?BP:  120/71 105/66 110/62  ?Pulse:  (!) 105 (!) 107 100  ?Resp:    18  ?Temp:    (!) 97.4 ?F (36.3 ?C)  ?TempSrc:      ?SpO2: 96% 96% 100% 95%  ?Weight:      ?Height:      ? ?General exam: Appear weak but conversant, in no acute distress ?Respiratory system: normal chest rise, clear, no audible wheezing ?Cardiovascular system: regular rhythm, s1-s2 ?Gastrointestinal system: Nondistended, nontender, pos BS ?Central nervous system: No seizures, no tremors ?Extremities:  No cyanosis, no joint deformities ?Skin: No rashes, no pallor ?Psychiatry: Affect normal // no auditory hallucinations  ? ?Data Reviewed: ? ?Labs reviewed: Na 128, K 4.7, Cr 0.71 ? ?Family Communication: Pt in room, family not at bedside ? ?Disposition: ?Status is: Inpatient ?Requires inpatient stay  because: Severity of illness ? Planned Discharge Destination: Home when cleared by pulmonology ? ? ? ?Author: ?Florencia Reasons, MD PhD FACP ?06/23/2021 8:09 AM ? ?For on call review [email or web address].  ?

## 2021-06-23 NOTE — Progress Notes (Signed)
? ?NAME:  Mario [name], MRN:  [id number], DOB:  Jul 07, 1950, LOS: 6 ?ADMISSION DATE:  06/16/2021, CONSULTATION DATE:  06/21/2021 ?REFERRING MD:  Dr. Wyline Copas, CHIEF COMPLAINT:  Shortness of breath  ? 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 Julien Nordmann. Presents to ED on 4/19 with reported 3 days of bilateral leg numbness and hypoxia with mild nonproductive cough. On arrival to ED tachycardic (HR 118), on baseline 3L [place], WBC 13, LA 2.6. CXR with patchy infiltrate in left lower lung. Started on Cefepime. Pulmonary Consulted due to non-resolving pneumonia with multiple recent admissions.  ?  ?Of note patient was recently admitted 3/22-3/24 with hypoxia, CT Chest with new left sided consolidation suspicious for infection, treated with steroids, Augmentin/azithromycin, discharged on 4L [place]. Shortly after on 3/24 came back to ED with hypoxia and productive cough. Completed Augmentin/Azithromycin/Steroid course a this time. Discharged 3/27 on 3L [place]. Presented 4/6 with continued shortness of breath, reported 3-4 days of new onset productive cough and fevers. CXR with right lower lobe pneumonia and left lobe pleural effusion. Completed Cefepime course. Discharged 4/13.  ? 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 ?Significant Hospital Events: ?Including procedures, antibiotic start and stop dates in addition to other pertinent events   ?4/19 > Presents to ED with hypoxia, non-productive cough  ?4/26 No acute issues overnight, agrees for bedside thora this am  ? ?Interim History / Subjective:  ?Reports poor sleep overnight, denies any acute complaints  ? ?Objective   ?Blood pressure 110/62, pulse 100, temperature (!) 97.4 ?F (36.3 ?C), resp.  rate 18, height _0  (1.88 m), weight (!) 152.1 kg, SpO2 95 %. ?   ?   ? ?Intake/Output Summary (Last 24 hours) at 06/23/2021 0841 ?Last data filed at 06/22/2021 1725 ?Gross per 24 hour  ?Intake 600 ml  ?Output 1001 ml  ?Net -401 ml  ? ?Filed Weights  ? 06/16/21 1939  ?Weight: (!) 152.1 kg  ? ? ?Examination: ?General: Pleasant well appearing middle aged male lying in bed in NAD  ?HEENT: [place]/AT, MM pink/moist, PERRL,  ?Neuro: Alert and oriented x3, non-focal  ?CV: s1s2 regular rate and rhythm, no murmur, rubs, or gallops,  ?PULM:  Diminished breath to left base otherwise clear to ascultation, no increased work of breathing, no added breath sounds  ?GI: soft, bowel sounds active in all 4 quadrants, non-tender, non-distended, tolerating oral diet ?Extremities: warm/dry, no edema  ?Skin: no rashes or lesions ? ?Resolved Hospital Problem list   ? ? ?Assessment & Plan:  ?Persistent Left Lower lobe infiltrate  ?Adenocarcinoma of left lung s/p SRBT, completed 09/2020 ?-CTA Chest 4/7 with increased underlying left pleural effusion, predominantly subpulmonic, may be at least partially loculated with partial fissural extension of the fluid, left lower lobe consolidation not significantly changed from 16 days prior ?-Legionella, Step P negative, COVID negative, Sputum Culture 3/24 Rare GPC  ?P: ?Evaluated pleural space via ultrasound to determine safe window for thora again am of 4/26 (see image below)  ?Continue to hold Eliquis, can likely resume 4/27 ?Continue IV Cefepime  ?Send pleural studies post thora   ? ? ?H/O PE 03/04/21 ?-Anticoagulated with PO Eliquis at baseline  ?P: ?Hold Eliquis as above  ?Lovenox DVT ppx ?  ?Chronic Hypoxic Respiratory Failure  in setting of advanced COPD/Emphysema (on 3L [place] baseline)  ?OSA on CPAP at HS  ?P: ?Continue supplemental oygen for at goal> 88 ?Encourage pulmonary hygiene  ?Mobilize as able   ?Continue CPAP qHS ? ?Best Practice (right click and "Reselect all SmartList Selections" daily)   ? ?Per primary  ? ?Critical care time: NA  ?Kerby Hockley D. Harris, NP-C ?[facility] ?Personal contact information can be found on Amion  ?06/23/2021, 8:47 AM ? ? ? ? ? ? ?

## 2021-06-23 NOTE — Progress Notes (Signed)
PT Cancellation Note ? ?Patient Details ?Name: Mario [name] ?MRN: [id number] ?DOB: August 20, 1950 ? ? ?Cancelled Treatment:    Reason Eval/Treat Not Completed: Fatigue/lethargy limiting ability to participate (pt reports he's exhausted, can't keep his eyes open, needs to sleep at present. He declined PT. Will follow.) ? ? ?Blondell Reveal Kistler PT 06/23/2021  ?Acute Rehabilitation Services ?Pager [phone] ?Office [phone] ? ?

## 2021-06-23 NOTE — Progress Notes (Signed)
Post Procedure Vital Signs completed ?Thoracentesis (Left) ?Patient alert and oriented x4  ?No complications noted  ?

## 2021-06-23 NOTE — Procedures (Addendum)
Thoracentesis  Procedure Note ? ?Mario [name]  ?[id number]  ?1950-05-01 ? ?Date:06/23/21  ?Time:10:39 AM  ? ?Provider Performing:Telesa Jeancharles D. Harris  ? ?Procedure: Thoracentesis with imaging guidance (16384) ? ?Indication(s) ?Pleural Effusion ? ?Consent ?Risks of the procedure as well as the alternatives and risks of each were explained to the patient and/or caregiver.  Consent for the procedure was obtained and is signed in the bedside chart ? ?Anesthesia ?Topical only with 1% lidocaine  ? ? ?Time Out ?Verified patient identification, verified procedure, site/side was marked, verified correct patient position, special equipment/implants available, medications/allergies/relevant history reviewed, required imaging and test results available. ? ? ?Sterile Technique ?Maximal sterile technique including full sterile barrier drape, hand hygiene, sterile gown, sterile gloves, mask, hair covering, sterile ultrasound probe cover (if used). ? ? ? ?Procedure Description ?Ultrasound was used to identify appropriate pleural anatomy for placement and overlying skin marked.  Area of drainage cleaned and draped in sterile fashion. Lidocaine was used to anesthetize the skin and subcutaneous tissue.  600 cc's of Amber appearing fluid was drained from the left pleural space. Catheter then removed and bandaid applied to site. ? ? ?Complications/Tolerance ?None; patient tolerated the procedure well. ?Chest X-ray is ordered to confirm no post-procedural complication. ? ? ?EBL ?Minimal ? ? ?Specimen(s) ?Pleural fluid ? ? ?Sahith Nurse D. Harris, NP-C ?[facility] ?Personal contact information can be found on Amion  ?06/23/2021, 10:40 AM ? ? ? ? ? ? ? ? ?

## 2021-06-23 NOTE — Progress Notes (Signed)
Pre Procedure Vital Signs ?Patient alert and oriented x4 ?Time Out completed for Thoracentesis (Left) ?

## 2021-06-24 ENCOUNTER — Inpatient Hospital Stay (HOSPITAL_COMMUNITY): Payer: No Typology Code available for payment source

## 2021-06-24 DIAGNOSIS — J189 Pneumonia, unspecified organism: Secondary | ICD-10-CM | POA: Diagnosis not present

## 2021-06-24 LAB — CBC
HCT: 28.1 % — ABNORMAL LOW (ref 39.0–52.0)
Hemoglobin: 9.5 g/dL — ABNORMAL LOW (ref 13.0–17.0)
MCH: 33.1 pg (ref 26.0–34.0)
MCHC: 33.8 g/dL (ref 30.0–36.0)
MCV: 97.9 fL (ref 80.0–100.0)
Platelets: 257 10*3/uL (ref 150–400)
RBC: 2.87 MIL/uL — ABNORMAL LOW (ref 4.22–5.81)
RDW: 13.7 % (ref 11.5–15.5)
WBC: 10.1 10*3/uL (ref 4.0–10.5)
nRBC: 0 % (ref 0.0–0.2)

## 2021-06-24 LAB — BASIC METABOLIC PANEL
Anion gap: 6 (ref 5–15)
BUN: 25 mg/dL — ABNORMAL HIGH (ref 8–23)
CO2: 27 mmol/L (ref 22–32)
Calcium: 8.4 mg/dL — ABNORMAL LOW (ref 8.9–10.3)
Chloride: 95 mmol/L — ABNORMAL LOW (ref 98–111)
Creatinine, Ser: 0.82 mg/dL (ref 0.61–1.24)
GFR, Estimated: >60 mL/min (ref >60)
Glucose, Bld: 230 mg/dL — ABNORMAL HIGH (ref 70–99)
Potassium: 5.2 mmol/L — ABNORMAL HIGH (ref 3.5–5.1)
Sodium: 128 mmol/L — ABNORMAL LOW (ref 135–145)

## 2021-06-24 LAB — TROPONIN I (HIGH SENSITIVITY)
Troponin I (High Sensitivity): 5 ng/L (ref <18)
Troponin I (High Sensitivity): 5 ng/L (ref <18)

## 2021-06-24 LAB — GLUCOSE, CAPILLARY: Glucose-Capillary: 127 mg/dL — ABNORMAL HIGH (ref 70–99)

## 2021-06-24 MED ORDER — CHLORHEXIDINE GLUCONATE CLOTH 2 % EX PADS
6.0000 | MEDICATED_PAD | Freq: Every day | CUTANEOUS | Status: DC
  Administered 2021-06-24 – 2021-07-03 (×9): 6 via TOPICAL

## 2021-06-24 MED ORDER — SODIUM CHLORIDE 0.9% FLUSH: 10.0000 mL | INTRAVENOUS | Status: DC | PRN

## 2021-06-24 MED ORDER — SODIUM CHLORIDE 0.9% FLUSH
10.0000 mL | Freq: Two times a day (BID) | INTRAVENOUS | Status: DC
  Administered 2021-06-24 – 2021-06-26 (×5): 10 mL
  Administered 2021-06-27: 11 mL
  Administered 2021-06-27 – 2021-07-05 (×13): 10 mL

## 2021-06-24 MED ORDER — POLYETHYLENE GLYCOL 3350 17 G PO PACK
17.0000 g | PACK | Freq: Two times a day (BID) | ORAL | Status: DC
  Administered 2021-06-24 – 2021-07-05 (×14): 17 g via ORAL
  Filled 2021-06-24 (×21): qty 1

## 2021-06-24 MED ORDER — SODIUM ZIRCONIUM CYCLOSILICATE 5 G PO PACK
5.0000 g | PACK | Freq: Every day | ORAL | Status: AC
  Administered 2021-06-24 – 2021-06-25 (×2): 5 g via ORAL
  Filled 2021-06-24 (×2): qty 1

## 2021-06-24 MED ORDER — SENNOSIDES-DOCUSATE SODIUM 8.6-50 MG PO TABS
1.0000 | ORAL_TABLET | Freq: Two times a day (BID) | ORAL | Status: DC
  Administered 2021-06-24 – 2021-07-06 (×22): 1 via ORAL
  Filled 2021-06-24 (×24): qty 1

## 2021-06-24 NOTE — Progress Notes (Signed)

## 2021-06-24 NOTE — Plan of Care (Signed)

## 2021-06-24 NOTE — Progress Notes (Signed)
Physical Therapy Treatment ?Patient Details ?Name: Mario [name] ?MRN: [id number] ?DOB: 1950-11-13 ?Today's Date: 06/24/2021 ? ? ?History of Present Illness Pt is 71 yo male admitted on 06/16/21 with acute on chronic resp failure - COPD, CAP, sepsis and hx of Bernice lung CA stage II s/p radiation. Pt also with new c/o leg paresthesia.  Pt's 4th admission for similar dx in past 6 weeks.  Pt with hx including PE in 2023, OSA on CPAP, HTN, chronic back pain, hx of ankylosing spondylitis, anxiety, and depression, T2DM. ? ?  ?PT Comments  ? ? Patient is mobilizing in room and steady overall but limited by poor endurance and SOB/DOE. Pt ambulated ~60' but c/o dizziness this date with activity. SpO2 94% or better on 3L/min and HR in 90's with BP 129/70 mmHg. Reviewed benefits of flutter valve and IS to improve inhalation/exhalation and pt completed 5 resp with flutter vale. He will benefit from follow up HHPT when pt is ready to discharge home. Will continue to progress pt as able. ?  ?Recommendations for follow up therapy are one component of a multi-disciplinary discharge planning process, led by the attending physician.  Recommendations may be updated based on patient status, additional functional criteria and insurance authorization. ? ?Follow Up Recommendations ? Home health PT ?  ?  ?Assistance Recommended at Discharge PRN  ?Patient can return home with the following Assist for transportation ?  ?Equipment Recommendations ? None recommended by PT  ?  ?Recommendations for Other Services   ? ? ?  ?Precautions / Restrictions Precautions ?Precautions: Fall ?Precaution Comments: Home oxygen 3 lts "for years" ?Restrictions ?Weight Bearing Restrictions: No  ?  ? ?Mobility ? Bed Mobility ?  ?  ?  ?  ?  ?  ?  ?General bed mobility comments: sitting EOB ?  ? ?Transfers ?Overall transfer level: Needs assistance ?Equipment used: None ?Transfers: Sit to/from Stand ?Sit to Stand: Modified independent (Device/Increase time) ?  ?  ?  ?   ?  ?General transfer comment: pt safe with sit<>stand from EOB ?  ? ?Ambulation/Gait ?Ambulation/Gait assistance: Min assist, Min guard ?Gait Distance (Feet): 60 Feet ?Assistive device: Straight cane ?Gait Pattern/deviations: Decreased stride length ?Gait velocity: decr ?  ?  ?General Gait Details: pt slightly unsteady with LOB to Rt 2x and min assist required to restablize. pt c/o dizziness/lightheadedness with gait today, VSS throughout on 3L/min. ? ? ?Stairs ?  ?  ?  ?  ?  ? ? ?Wheelchair Mobility ?  ? ?Modified Rankin (Stroke Patients Only) ?  ? ? ?  ?Balance Overall balance assessment: Needs assistance ?Sitting-balance support: No upper extremity supported ?Sitting balance-Leahy Scale: Good ?  ?  ?Standing balance support: During functional activity, No upper extremity supported ?Standing balance-Leahy Scale: Fair ?  ?  ?  ?  ?  ?  ?  ?  ?  ?  ?  ?  ?  ? ?  ?Cognition Arousal/Alertness: Awake/alert ?Behavior During Therapy: [facility] for tasks assessed/performed ?Overall Cognitive Status: Within Functional Limits for tasks assessed ?  ?  ?  ?  ?  ?  ?  ?  ?  ?  ?  ?  ?  ?  ?  ?  ?  ?  ?  ? ?  ?Exercises Other Exercises ?Other Exercises: 5 reps flutter valve at EOS ? ?  ?General Comments   ?  ?  ? ?Pertinent Vitals/Pain Pain Assessment ?Pain Assessment: No/denies pain  ? ? ?Home  Living   ?  ?  ?  ?  ?  ?  ?  ?  ?  ?   ?  ?Prior Function    ?  ?  ?   ? ?PT Goals (current goals can now be found in the care plan section) Acute Rehab PT Goals ?Patient Stated Goal: for his breathing to improve ?PT Goal Formulation: With patient ?Time For Goal Achievement: 07/02/21 ?Potential to Achieve Goals: Good ?Progress towards PT goals: Progressing toward goals ? ?  ?Frequency ? ? ? Min 3X/week ? ? ? ?  ?PT Plan Current plan remains appropriate  ? ? ?Co-evaluation   ?  ?  ?  ?  ? ?  ?AM-PAC PT "6 Clicks" Mobility   ?Outcome Measure ? Help needed turning from your back to your side while in a flat bed without using bedrails?:  None ?Help needed moving from lying on your back to sitting on the side of a flat bed without using bedrails?: None ?Help needed moving to and from a bed to a chair (including a wheelchair)?: None ?Help needed standing up from a chair using your arms (e.g., wheelchair or bedside chair)?: None ?Help needed to walk in hospital room?: A Little ?Help needed climbing 3-5 steps with a railing? : A Little ?6 Click Score: 22 ? ?  ?End of Session Equipment Utilized During Treatment: Oxygen ?Activity Tolerance: Patient tolerated treatment well ?Patient left: in bed;with call bell/phone within reach ?Nurse Communication: Mobility status ?PT Visit Diagnosis: Other abnormalities of gait and mobility (R26.89) ?  ? ? ?Time: 1205-1224 ?PT Time Calculation (min) (ACUTE ONLY): 19 min ? ?Charges:  $Gait Training: 8-22 mins          ?          ? ?Gwynneth Albright PT, DPT ?Acute Rehabilitation Services ?Office [phone] ?Pager [phone]  ? ? ?Jacques Navy ?06/24/2021, 1:40 PM ? ?

## 2021-06-24 NOTE — Progress Notes (Addendum)
? ? ?  OVERNIGHT PROGRESS REPORT ? ?Brief history ?71 year old male medical history of NSCLC s/p SBRT, history of vasculitis and ankylosing spondylitis on methotrexate and mycophenolate. 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 ? ?Notified by RN after she was notified by patient of fall in room. ? ?Bedside visit ?Patient is awake and oriented x4, pleasant, and conversational. ? ?Patient moves extremities x4 ?Extremity strength is strong/baseline x4 ?No obvious deformity, contusion. abrasion. Bruises,.tears. lacerations. or swelling   ? ?Pupils are equal, round, reactive to light. ? ?Patient states he was sitting on bedside leaned over to retrieve something he had dropped onto the floor and slid out of the bed landing on his buttocks. ? ?Patient states "I did not hit my head" ?Patient is on blood thinner. ? ?Patient states he does have some lower back upper buttocks pain which is a chronic back problem. ?Patient states there may be some increased discomfort to his lower back/ upper buttocks area therefore we will x-ray his pelvis. ? ?In light of his unwitnessed fall and blood thinner we will CT his head. ? ?Results pending. ? ?======================== ?Update ?Results 0028 Hrs ? ?"FINDINGS: ?There is no evidence of pelvic fracture or diastasis. 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 No pelvic bone ?lesions are seen. ?  ?IMPRESSION: ?No acute fracture or dislocation. ? ?Electronically Signed ?  By: Fidela Salisbury M.D. ?  On: 06/24/2021 23:28" ? ? =================================== ? Update: ?Results 0145 Hrs ? ?" FINDINGS: ?Brain: No evidence of acute infarction, hemorrhage, hydrocephalus, ?extra-axial collection or mass lesion/mass effect. ?  ?Mild cortical atrophy. ?  ?Subcortical white matter and periventricular small vessel ischemic ?changes. ?  ?Vascular: No hyperdense vessel or unexpected calcification. ?  ?Skull: Normal. Negative for fracture  or focal lesion. ?  ?Sinuses/Orbits: The visualized paranasal sinuses are essentially ?clear. The mastoid air cells are unopacified. ?  ?Other: None. ?  ?IMPRESSION: ?No evidence of acute intracranial abnormality. Mild atrophy with ?small vessel ischemic changes. ?  ?  ?Electronically Signed ?  By: Julian Hy M.D. ?  On: 06/25/2021 00:55 " ? ? ?  ?Gershon Cull MSNA MSN ACNPC-AG ?Acute Care Nurse Practitioner ?[facility] ?[place] ? ? ? ?

## 2021-06-24 NOTE — Progress Notes (Signed)
?Progress Note ? ? ?Patient: Mario [name] GUR:[id number] DOB: 03-06-1950 DOA: 06/16/2021     7 ?DOS: the patient was seen and examined on 06/24/2021 ?  ?Brief hospital course: ? ?71 y.o. 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 ? ?He was given IV vancomycin and cefepime with Solu-Medrol in the ED.  Hospitalist called for admission for management of left lower lobe pneumonia seen on imaging. ? ?Assessment and Plan: ? ?Acute on chronic respiratory failure ([place]) ?Baseline on 3L with reported hypoxia to 76% at home. CXR findings of Left lower lobe pneumonia/pleural effusion ?-continue IV cefepime. MRSA PCR negative recently ?--Consulted Pulmonary. S/p bedside thoracentesis 4/26  ?-Oncology following, recommending sending fluid for cytology as well ? ?Sepsis ([place]) ?Presented with tachycardia, leukocytosis with chest x-ray finding of pneumonia ?-cont on above abx ?Blood culture no growth, awaiting for pleural fluid culture ? ?HTN ?Presents with sepsis, continue cardizem , hold lisinopril ? ? ?Hyponatremia ?-Sodium down to 128 this AM ?-Appears euvolemic on exam ?-urine osmolarity is elevated, urine sodium is 25,  ?-he takes Cymbalta and Trileptal which could contribute to hyponatremia will discuss with patient ?--consider salt tabs and lasix ? ?K 5.2 ? Lokelma ? ?Non-small cell cancer of left lung ([place]), stage III at diagnosis ?Completed SBRT to the left   lower lobe lung mass under the care of Dr. Sondra Come completed on 10/15/2020. Currently on observation. ?Next appointment with oncology Dr. Earlie Server 5/23, per oncology note from 4/15 "can move this appointment up if needed if cytology positive for malignant cells. " ? ?History of pulmonary embolism ?On eliquis at home, on hold for  thoracentesis on 4/26 ?No evidence of bleed ? Pulmonary oked to resume eliquis after thoracentesis ? ? ?DM2 (diabetes mellitus, type 2) ([place]), on metformin at home ?Hemoglobin A1c of 7.6 in March ?Glycemic trends stable ?Cont SSI ? ?Leg paresthesia ?Reports acute bilateral feet paresthesia for past 3 days prior to admit. Strong dorsalis pedis pulses heard on bedside doppler.  ?-Numbness noted primarily at soles of B feet.  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 ?OSA (obstructive sleep apnea) ?Declined hospital CPAP ?Obesity, Class III, BMI 40-49.9 (morbid obesity) ([place]) ?BMI 43 ?Recommend diet/lifestyle modification ? ? ? ?Constipation, start stool softener ? ?  ? ?Subjective:  ? ?He is sitting up in bed, currently denies pain, currently on 3 L of oxygen supplement, does not think he is any better ?Complaining feeling constipated ? ?Date reviewed: ?BP improved ?Persistent Hyponatremia ?Hgb 10.5, WBC 9.6 ? ?Physical Exam: ?Vitals:  ? 06/23/21 1029 06/23/21 1321 06/23/21 1958 06/24/21 0427  ?BP: (!) 102/55 125/68 119/75 137/75  ?Pulse: 94 (!) 102 100 95  ?Resp:  20 19 18   ?Temp:   (!) 97.3 ?F (36.3 ?C) (!) 97.5 ?F (36.4 ?C)  ?TempSrc:   Oral Oral  ?SpO2: 98% 98% 100% 99%  ?Weight:      ?Height:      ? ?General exam: Appear weak but conversant, in no acute distress ?Respiratory system: normal chest rise, clear, no audible wheezing ?Cardiovascular system: regular rhythm,  s1-s2 ?Gastrointestinal system: Nondistended, nontender, pos BS ?Central nervous system: No seizures, no tremors ?Extremities: No cyanosis, no joint deformities ?Skin: No rashes, no pallor ?Psychiatry: Affect normal // no auditory hallucinations  ? ?Data Reviewed: ? ?Labs reviewed: Na 128, K 4.7, Cr 0.71 ? ?Family  Communication: Pt in room, family not at bedside ? ?Disposition: ?Status is: Inpatient ?Requires inpatient stay because: Severity of illness ? Planned Discharge Destination: Home when cleared by pulmonology ? ? ? ?Author: ?Florencia Reasons, MD PhD FACP ?06/24/2021 9:51 AM ? ?For on call review [email or web address].  ?

## 2021-06-25 ENCOUNTER — Inpatient Hospital Stay (HOSPITAL_COMMUNITY): Payer: No Typology Code available for payment source

## 2021-06-25 DIAGNOSIS — J189 Pneumonia, unspecified organism: Secondary | ICD-10-CM | POA: Diagnosis not present

## 2021-06-25 LAB — BASIC METABOLIC PANEL
Anion gap: 8 (ref 5–15)
BUN: 24 mg/dL — ABNORMAL HIGH (ref 8–23)
CO2: 27 mmol/L (ref 22–32)
Calcium: 8.4 mg/dL — ABNORMAL LOW (ref 8.9–10.3)
Chloride: 93 mmol/L — ABNORMAL LOW (ref 98–111)
Creatinine, Ser: 0.78 mg/dL (ref 0.61–1.24)
GFR, Estimated: >60 mL/min (ref >60)
Glucose, Bld: 189 mg/dL — ABNORMAL HIGH (ref 70–99)
Potassium: 4.8 mmol/L (ref 3.5–5.1)
Sodium: 128 mmol/L — ABNORMAL LOW (ref 135–145)

## 2021-06-25 LAB — GLUCOSE, CAPILLARY
Glucose-Capillary: 171 mg/dL — ABNORMAL HIGH (ref 70–99)
Glucose-Capillary: 175 mg/dL — ABNORMAL HIGH (ref 70–99)
Glucose-Capillary: 167 mg/dL — ABNORMAL HIGH (ref 70–99)

## 2021-06-25 LAB — CYTOLOGY - NON PAP

## 2021-06-25 MED ORDER — FUROSEMIDE 10 MG/ML IJ SOLN
20.0000 mg | Freq: Once | INTRAMUSCULAR | Status: AC
Start: 2021-06-25 — End: 2021-06-25
  Administered 2021-06-25: 20 mg via INTRAVENOUS
  Filled 2021-06-25: qty 2

## 2021-06-25 MED ORDER — LIDOCAINE 5 % EX PTCH
1.0000 | MEDICATED_PATCH | CUTANEOUS | Status: DC
  Administered 2021-06-25 – 2021-07-05 (×10): 1 via TRANSDERMAL
  Filled 2021-06-25 (×12): qty 1

## 2021-06-25 MED ORDER — METHOTREXATE 2.5 MG PO TABS
15.0000 mg | ORAL_TABLET | ORAL | Status: DC
  Administered 2021-06-25: 15 mg via ORAL
  Filled 2021-06-25: qty 6

## 2021-06-25 NOTE — Progress Notes (Signed)
?   06/24/21 2200  ?What Happened  ?Was fall witnessed? No  ?Was patient injured? 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, corporate)  ?Stated prior activity ambulating-unassisted  ?Follow Up  ?MD notified Olena Heckle NP  ?Time MD notified 2144  ?Additional tests Yes-comment  ?Simple treatment Ice  ?Progress note created (see row info) Yes  ?Adult Fall Risk Assessment  ?Risk Factor Category (scoring not indicated) High fall risk per protocol (document High fall risk);Fall has occurred during this admission (document High fall risk)  ?Patient Fall Risk Level High fall risk  ?Adult Fall Risk Interventions  ?Required Bundle Interventions *See Row Information* High fall risk - low, moderate, and high requirements implemented  ?Additional Interventions Use of appropriate toileting equipment (bedpan, BSC, etc.)  ?Screening for Fall Injury Risk (To be completed on HIGH fall risk patients) - Assessing Need for Floor Mats  ?Risk For Fall Injury- Criteria for Floor Mats Previous fall this admission  ?Will Implement Floor Mats Yes  ?Vitals  ?Temp 98.5 ?F (36.9 ?C)  ?Temp Source Oral  ?BP (!) 161/70  ?Pulse Rate (!) 110  ?Pulse Rate Source Dinamap  ?Resp 20  ?Oxygen Therapy  ?SpO2 99 %  ?O2 Device Nasal Cannula  ?O2 Flow Rate (L/min) 4 L/min  ?Pain Assessment  ?Pain Scale 0-10  ?Pain Score 8  ?Pain Type Acute pain;Chronic pain  ?Pain Location Back  ?Pain Orientation Right;Lower  ?Pain Descriptors / Indicators Dull  ?Pain Frequency Constant  ?Pain Onset On-going  ?Patients Stated Pain Goal 0  ?Pain Intervention(s) Medication (See eMAR)  ?Neurological  ?Neuro (WDL) WDL  ?Glasgow Coma Scale  ?Eye Opening 4  ?Best Verbal Response (NON-intubated) 5  ?Best Motor Response 6  ?Glasgow Coma Scale Score 15  ?Musculoskeletal  ?Musculoskeletal (WDL) X  ?Assistive Device Cane;Standard walker  ?Generalized Weakness Yes  ?Integumentary  ?Integumentary (WDL) WDL  ? ? ?

## 2021-06-25 NOTE — Progress Notes (Addendum)
? ?NAME:  Mario [name], MRN:  [id number], DOB:  12-Jan-1951, LOS: 8 ?ADMISSION DATE:  06/16/2021, CONSULTATION DATE:  06/21/2021 ?REFERRING MD:  Dr. Wyline Copas, CHIEF COMPLAINT:  Shortness of breath  ? 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 Julien Nordmann. Presents to ED on 4/19 with reported 3 days of bilateral leg numbness and hypoxia with mild nonproductive cough. On arrival to ED tachycardic (HR 118), on baseline 3L [place], WBC 13, LA 2.6. CXR with patchy infiltrate in left lower lung. Started on Cefepime. Pulmonary Consulted due to non-resolving pneumonia with multiple recent admissions.  ?  ?Of note patient was recently admitted 3/22-3/24 with hypoxia, CT Chest with new left sided consolidation suspicious for infection, treated with steroids, Augmentin/azithromycin, discharged on 4L [place]. Shortly after on 3/24 came back to ED with hypoxia and productive cough. Completed Augmentin/Azithromycin/Steroid course a this time. Discharged 3/27 on 3L [place]. Presented 4/6 with continued shortness of breath, reported 3-4 days of new onset productive cough and fevers. CXR with right lower lobe pneumonia and left lobe pleural effusion. Completed Cefepime course. Discharged 4/13.  ? 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 ?Significant Hospital Events: ?Including procedures, antibiotic start and stop dates in addition to other pertinent events   ?4/19 > Presents to ED with hypoxia, non-productive cough  ?4/26 No acute issues overnight, agrees for bedside thora this am  ?4/28 CT chest completed post thoracentesis 4/26 which revealed improvement in consolidation and effusion of left lung base, and severe centrilobular emphysema of upper lungs  seen ? ?Interim History / Subjective:  ?Seen sitting up with complaints of tailbone and lower back pain after suffering fall overnight.  Denies any acute pulmonary concerns ? ?Objective   ?Blood pressure (!) 119/57, pulse 94, temperature 97.7 ?F (36.5 ?C), temperature source Oral, resp. rate 16, height 6' 2" (1.88 m), weight (!) 152.1 kg, SpO2 99 %. ?   ?   ?No intake or output data in the 24 hours ending 06/25/21 1205 ? ?Filed Weights  ? 06/16/21 1939  ?Weight: (!) 152.1 kg  ? ? ?Examination: ?General: Acute on chronic ill-appearing elderly gentleman sitting up in no acute distress ?HEENT: [place]/AT, MM pink/moist, PERRL,  ?Neuro: Alert and oriented x3, nonfocal ?CV: s1s2 regular rate and rhythm, no murmur, rubs, or gallops,  ?PULM: Clear to auscultation bilaterally, diminished in the bases, no increased work of breathing, no added breath sounds ?GI: soft, bowel sounds active in all 4 quadrants, non-tender, non-distended, tolerating oral diet ?Extremities: warm/dry, no edema  ?Skin: no rashes or lesions ? ? ?Resolved Hospital Problem list   ? ? ?Assessment & Plan:  ?Persistent Left Lower lobe infiltrate  ?Adenocarcinoma of left lung s/p SRBT, completed 09/2020 ?-CTA Chest 4/7 with increased underlying left pleural effusion, predominantly subpulmonic, may be at least partially loculated with partial fissural extension of the fluid, left lower lobe consolidation not significantly changed from 16 days prior ?-Legionella, Step P negative, COVID negative, Sputum Culture 3/24 Rare GPC  ?P: ?S/p thoracentesis 4/26 ?Positive lights criteria ?Pleural culture and cytology pending ?Continue to follow patient clinically to determine if additional pulmonary interventions including repeat thoracentesis versus Pleurx catheter placement is needed  ? ?H/O PE 01/2021 ?-Anticoagulated with PO Eliquis at baseline  ?P: ?Continue home Eliquis ? ?  Chronic Hypoxic Respiratory Failure in setting of advanced COPD/Emphysema (on 3L [place] baseline)   ?OSA on CPAP at HS  ?P: ?Continue supplemental oxygen for sat goal greater than 88 ?Encourage pulmonary hygiene ?Mobilize as able ?Continue CPAP nightly ? ?Pulmonary will follow chart peripherally over the weekend, please call if assistance is needed ? ?Best Practice (right click and "Reselect all SmartList Selections" daily)  ? ?Per primary  ? ?Critical care time: NA  ?Shamera Yarberry D. Harris, NP-C ?[facility] ?Personal contact information can be found on Amion  ?06/25/2021, 12:05 PM ? ? ? ? ? ? ?

## 2021-06-25 NOTE — Progress Notes (Addendum)
?Progress Note ? ? ?Patient: Mario [name] JOA:[id number] DOB: 1950/06/21 DOA: 06/16/2021     8 ?DOS: the patient was seen and examined on 06/25/2021 ?  ?Brief hospital course: ? ?71 y.o. 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 found to have LLL PNA/pleural effusion ? ? ? ?Assessment and Plan: ? ?Acute on chronic respiratory failure ([place]) Baseline on 3L with reported hypoxia to 76% at home.  ?Sepsis , POA ?Presented with tachycardia, leukocytosis, CXR findings of Left lower lobe pneumonia/pleural effusion,  seen by pulmonology, s/p thoracentesis on 4/26 with 600cc removed ?-Blood culture no growth, pleural fluid culture no growth  ?- MRSA PCR negative recently ?- finished cefepime x7 days ? ?Non-small cell cancer of left lung ([place]), stage III at diagnosis ?-Completed SBRT to the left   lower lobe lung mass under the care of Dr. Sondra Come completed on 10/15/2020. Currently on observation. ?-Next appointment with oncology Dr. Earlie Server 5/23, per oncology note from 4/15 "can move this appointment up if needed if cytology positive for malignant cells. " ?-Unfortunately pleural fluid obtained from 4/25 cytology is positive for malignant cells "Malignant cells present  ?- Adenocarcinoma consistent with pulmonary primary" ?- need to see oncology earlier, message sent to oncology Dr Julien Nordmann through inbasket messaging  ? ?History of pulmonary embolism ?On eliquis at home, on hold for thoracentesis on 4/26 ?No evidence of bleed ? Pulmonary oked to resume eliquis after thoracentesis ? ? ?Hyponatremia, likely mutifactorial ?-appears has been having hyponatremia since 2022 ?--urine osmolarity is elevated, urine sodium is 25,  ?-he takes Cymbalta and  Trileptal which could contribute to hyponatremia will discuss with patient ?-abm blood glucose 189, correct sodium 129 ?-appear to have leg edema , will start lasix, monitor  ? ?Hyperkalemia ?K 5.2 on 4/27 ? Received Lokelma on 4/27, repeated K normalized ?Continue hold lisinopril ? ?HTN ?Presents with sepsis, continue cardizem , hold lisinopril ? ? ?DM2 (diabetes mellitus, type 2) ([place]), on metformin at home ?Hemoglobin A1c of 7.6 in March ?Glycemic trends stable ?Cont SSI ? ?Leg paresthesia ?Reports acute bilateral feet paresthesia for past 3 days prior to admit. Strong dorsalis pedis pulses heard on bedside doppler.  ?-Numbness noted primarily at soles of B feet.  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 ?OSA (obstructive sleep apnea) ?Declined hospital CPAP ?Obesity, Class III, BMI 40-49.9 (morbid obesity) ([place]) ?BMI 43 ?Recommend diet/lifestyle modification ? ? ? ?Constipation, start stool softener ? ?FTT: need home health ? ?Subjective:  ? ?Fell overnight, reported accidental fall while bending over trying to reach something, CT head no acute findings, reports sacral pain after the fall, pelvic x ray no acute findings ? ?He is sitting up in bed,  currently on 4L of oxygen supplement, does not think he is any better ?Legs are swollen , agree with lasix today ? ?Date reviewed: ?BP improved ?Persistent Hyponatremia ?K normalized ?A.m. blood glucose 189 ?Creatinine 0.70 ?Hgb 10.5, WBC 9.5 ? ? ?  Physical Exam: ?Vitals:  ? 06/24/21 2200 06/25/21 0339 06/25/21 0805 06/25/21 1731  ?BP: (!) 161/70 (!) 119/57  (!) 147/71  ?Pulse: (!) 110 94  92  ?Resp: 20 16  (!) 22  ?Temp: 98.5 ?F (36.9 ?C) 97.7  ?F (36.5 ?C)  98.2 ?F (36.8 ?C)  ?TempSrc: Oral Oral    ?SpO2: 99% 99% 99% 96%  ?Weight:      ?Height:      ? ?General exam: Appear weak but conversant, in no acute distress ?Respiratory system: normal chest rise, clear, no audible wheezing ?Cardiovascular system: regular rhythm, s1-s2 ?Gastrointestinal system: Nondistended, nontender, pos BS ?Central nervous system: No seizures, no tremors ?Extremities: Edema right leg greater than left leg ,no cyanosis, no joint deformities ?Skin: No rashes, no pallor ?Psychiatry: Affect normal // no auditory hallucinations  ? ? ?Family Communication: Pt in room, does not appear to have great social support ,reported lives with a roommate who can get confused at times, does not have family in [place] ? ?Disposition: ? ?Requires inpatient stay because: Severity of illness ? Planned Discharge Destination: Home with HH, social worker when sodium improves ? ? ? ?Author: ?Florencia Reasons, MD PhD FACP ?06/25/2021 5:41 PM ? ?For on call review [email or web address]  ?

## 2021-06-25 NOTE — Plan of Care (Signed)

## 2021-06-26 DIAGNOSIS — J189 Pneumonia, unspecified organism: Secondary | ICD-10-CM | POA: Diagnosis not present

## 2021-06-26 LAB — GLUCOSE, CAPILLARY
Glucose-Capillary: 157 mg/dL — ABNORMAL HIGH (ref 70–99)
Glucose-Capillary: 162 mg/dL — ABNORMAL HIGH (ref 70–99)
Glucose-Capillary: 145 mg/dL — ABNORMAL HIGH (ref 70–99)
Glucose-Capillary: 177 mg/dL — ABNORMAL HIGH (ref 70–99)
Glucose-Capillary: 166 mg/dL — ABNORMAL HIGH (ref 70–99)
Glucose-Capillary: 172 mg/dL — ABNORMAL HIGH (ref 70–99)
Glucose-Capillary: 164 mg/dL — ABNORMAL HIGH (ref 70–99)
Glucose-Capillary: 190 mg/dL — ABNORMAL HIGH (ref 70–99)
Glucose-Capillary: 201 mg/dL — ABNORMAL HIGH (ref 70–99)
Glucose-Capillary: 205 mg/dL — ABNORMAL HIGH (ref 70–99)
Glucose-Capillary: 231 mg/dL — ABNORMAL HIGH (ref 70–99)
Glucose-Capillary: 195 mg/dL — ABNORMAL HIGH (ref 70–99)

## 2021-06-26 LAB — BODY FLUID CULTURE W GRAM STAIN: Culture: NO GROWTH

## 2021-06-26 LAB — BASIC METABOLIC PANEL
Anion gap: 10 (ref 5–15)
BUN: 17 mg/dL (ref 8–23)
CO2: 28 mmol/L (ref 22–32)
Calcium: 8.5 mg/dL — ABNORMAL LOW (ref 8.9–10.3)
Chloride: 90 mmol/L — ABNORMAL LOW (ref 98–111)
Creatinine, Ser: 0.89 mg/dL (ref 0.61–1.24)
GFR, Estimated: >60 mL/min (ref >60)
Glucose, Bld: 181 mg/dL — ABNORMAL HIGH (ref 70–99)
Potassium: 4.2 mmol/L (ref 3.5–5.1)
Sodium: 128 mmol/L — ABNORMAL LOW (ref 135–145)

## 2021-06-26 MED ORDER — GLUCERNA SHAKE PO LIQD
237.0000 mL | Freq: Three times a day (TID) | ORAL | Status: DC
  Administered 2021-06-26 – 2021-07-05 (×25): 237 mL via ORAL
  Filled 2021-06-26 (×31): qty 237

## 2021-06-26 MED ORDER — PREDNISOLONE ACETATE 1 % OP SUSP
1.0000 [drp] | OPHTHALMIC | Status: DC
  Administered 2021-06-26 – 2021-07-03 (×58): 1 [drp] via OPHTHALMIC
  Filled 2021-06-26: qty 5

## 2021-06-26 MED ORDER — LIDOCAINE 4 % EX CREA
TOPICAL_CREAM | Freq: Two times a day (BID) | CUTANEOUS | Status: DC | PRN
  Filled 2021-06-26: qty 5

## 2021-06-26 MED ORDER — FUROSEMIDE 10 MG/ML IJ SOLN
40.0000 mg | Freq: Every day | INTRAMUSCULAR | Status: AC
  Administered 2021-06-26 – 2021-06-27 (×2): 40 mg via INTRAVENOUS
  Filled 2021-06-26 (×2): qty 4

## 2021-06-26 NOTE — Progress Notes (Addendum)
?Progress Note ? ? ?Patient: Mario [name] CWC:[id number] DOB: 09/07/1950 DOA: 06/16/2021     9 ?DOS: the patient was seen and examined on 06/26/2021 ?  ?Brief hospital course: ? ?71 y.o. 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 found to have LLL PNA/pleural effusion ? ? ? ?Assessment and Plan: ? ?Acute on chronic respiratory failure ([place]) Baseline on 3L with reported hypoxia to 76% at home.  ?Sepsis , POA ?Presented with tachycardia, leukocytosis, CXR findings of Left lower lobe pneumonia/pleural effusion,  seen by pulmonology, s/p thoracentesis on 4/26 with 600cc removed ?-Blood culture no growth, pleural fluid culture no growth , unfortunately pleural fluid cytology positive for adenocarcinoma ?- MRSA PCR negative recently ?- finished cefepime x7 days ?-Continue report significant dyspnea on exertion, not feeling better, appear volume overloaded, started Lasix ? ?Non-small cell cancer of left lung ([place]), stage III at diagnosis ?-Completed SBRT to the left   lower lobe lung mass under the care of Dr. Sondra Come completed on 10/15/2020. Currently on observation. ?-Next appointment with oncology Dr. Earlie Server 5/23, per oncology note from 4/15 "can move this appointment up if needed if cytology positive for malignant cells. " ?-Unfortunately pleural fluid obtained from 4/25 cytology is positive for malignant cells "Malignant cells present  ?- Adenocarcinoma consistent with pulmonary primary" ?- need to see oncology earlier, message sent to oncology Dr Julien Nordmann through [employer] and he acknowledged. ? ?History of pulmonary embolism ?On eliquis at home, continue ? ?Hyponatremia, likely mutifactorial ?-appears has been having  hyponatremia since 2022 ?--urine osmolarity is elevated, urine sodium is 25,  ?-he takes Cymbalta and Trileptal which could contribute to hyponatremia will discuss with patient ?-abm blood glucose 189, correct sodium 129 ?-appear to have leg edema ,  started lasix, fluids restriction ? ?Hyperkalemia ?K 5.2 on 4/27 ? Received Lokelma on 4/27, repeated K normalized ?Continue hold lisinopril ? ?HTN ?Presents with sepsis, continue cardizem , hold lisinopril ?Currently blood pressure stable ? ?DM2 (diabetes mellitus, type 2) ([place]), on metformin at home ?Hemoglobin A1c of 7.6 in March ?Glycemic trends stable ?Cont SSI ? ?Leg paresthesia ?Reports acute bilateral feet paresthesia for past 3 days prior to admit. Strong dorsalis pedis pulses heard on bedside doppler.  ?-Numbness noted primarily at soles of B feet.  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 ?OSA (obstructive sleep apnea) ?Declined hospital CPAP ?Obesity, Class III, BMI 40-49.9 (morbid obesity) ([place]) ?BMI 43 ?Recommend diet/lifestyle modification ? ? ?Constipation, start stool softener, having BM ? ?FTT: need home health, appears to have poor social support, transitional care made aware ? ?Subjective:  ? ?Urine output was not documented, he reports start to pee more, remain edematous, continue c/o DOE with minimal activity ?Asks for ensure,  ?Will do fluids restrition, increase lasix ? ?He is sitting up in bed,  currently on 4L of oxygen supplement, does not think he is any better ?Legs are swollen  ? ?Date reviewed: ?BP stable ?  Persistent Hyponatremia ?K normalized ?A.m. blood glucose 181 ?Creatinine 0.89 ? ? ? ?Physical Exam: ?Vitals:  ?  06/25/21 1731 06/25/21 2035 06/25/21 2201 06/26/21 0508  ?BP: (!) 147/71  130/67 127/75  ?Pulse: 92  97 92  ?Resp: (!) 22  20 20   ?Temp: 98.2 ?F (36.8 ?C)  97.7 ?F (36.5 ?C) 97.6 ?F (36.4 ?C)  ?TempSrc:   Oral Oral  ?SpO2: 96% 95% 100% 95%  ?Weight:      ?Height:      ? ?General exam: NAD, in no acute distress ?Respiratory system: Diminished at bases, no rales, no rhonchi, no wheezing ,normal chest rise, normal respiratory effort at rest ?Cardiovascular system: regular rhythm, s1-s2 ?Gastrointestinal system: Nondistended, nontender, pos BS ?Central nervous system: No seizures, no tremors ?Extremities: Edema right leg greater than left leg ,no cyanosis, no joint deformities ?Skin: No rashes, no pallor ?Psychiatry: Affect normal // no auditory hallucinations  ? ? ?Family Communication: Pt in room, does not appear to have great social support ,reported lives with a roommate who can get confused at times, does not have family in [place] ? ?Disposition: ? ?Requires inpatient stay because: Severity of illness ? Planned Discharge Destination: Home with HH, social worker when sodium and volume status improves ? ? ? ?Author: ?Florencia Reasons, MD PhD FACP ?06/26/2021 11:47 AM ? ?For on call review [email or web address]  ?

## 2021-06-26 NOTE — Progress Notes (Incomplete)
Patient continues to Sit on the edge of the bed even after multiple attempts to explain the importance of his safety and being in the bed safely. He is aware that he is now a High fall risk, but continues to ask staff to turn off his Bed alarm. Will continue to monitor patient.  ?

## 2021-06-27 ENCOUNTER — Inpatient Hospital Stay (HOSPITAL_COMMUNITY): Payer: No Typology Code available for payment source

## 2021-06-27 DIAGNOSIS — J189 Pneumonia, unspecified organism: Secondary | ICD-10-CM | POA: Diagnosis not present

## 2021-06-27 LAB — BASIC METABOLIC PANEL
Anion gap: 9 (ref 5–15)
BUN: 20 mg/dL (ref 8–23)
CO2: 29 mmol/L (ref 22–32)
Calcium: 8.7 mg/dL — ABNORMAL LOW (ref 8.9–10.3)
Chloride: 92 mmol/L — ABNORMAL LOW (ref 98–111)
Creatinine, Ser: 0.8 mg/dL (ref 0.61–1.24)
GFR, Estimated: >60 mL/min (ref >60)
Glucose, Bld: 187 mg/dL — ABNORMAL HIGH (ref 70–99)
Potassium: 4.7 mmol/L (ref 3.5–5.1)
Sodium: 130 mmol/L — ABNORMAL LOW (ref 135–145)

## 2021-06-27 LAB — MAGNESIUM: Magnesium: 2.1 mg/dL (ref 1.7–2.4)

## 2021-06-27 NOTE — TOC Progression Note (Signed)
Transition of Care (TOC) - Progression Note  ? ? ?Patient Details  ?Name: Mario [name] ?MRN: [id number] ?Date of Birth: Oct 18, 1950 ? ?Transition of Care (TOC) CM/SW Contact  ?Ross Ludwig, LCSW ?Phone Number: ?06/27/2021, 6:21 PM ? ?Clinical Narrative:    ? ?Patient asking about going to SNF for short term rehab.  CSW awaiting updated PT recommendations.  Patient will have to be approved by insurance to go to SNF.  CSW to continue to follow patient's progress throughout discharge planning. ? ? ?Expected Discharge Plan: [place] ?Barriers to Discharge: Continued Medical Work up ? ?Expected Discharge Plan and Services ?Expected Discharge Plan: [place] ?  ?  ?Post Acute Care Choice: Home Health ?Living arrangements for the past 2 months: [facility] ?                ?  ?  ?  ?  ?  ?HH Arranged: PT ?[place] Agency: [facility] ?Date HH Agency Contacted: 06/20/21 ?Time [place]: [phone] ?Representative spoke with at [place]: [name] ? ? ?Social Determinants of Health (SDOH) Interventions ?  ? ?Readmission Risk Interventions ? ?  06/07/2021  ?  4:15 PM  ?Readmission Risk Prevention Plan  ?Transportation Screening Complete  ?Medication Review Press photographer) Complete  ?PCP or Specialist appointment within 3-5 days of discharge Complete  ?[place] or Home Care Consult Complete  ?SW Recovery Care/Counseling Consult Complete  ?Palliative Care Screening Not Applicable  ?[place] Not Applicable  ? ? ?

## 2021-06-27 NOTE — Progress Notes (Signed)
?Progress Note ? ? ?Patient: Mario [name] JHE:[id number] DOB: 04/18/1950 DOA: 06/16/2021     10 ?DOS: the patient was seen and examined on 06/27/2021 ?  ?Brief hospital course: ? 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 found to have LLL PNA/pleural effusion/volume overload ? ? ? ?Assessment and Plan: ? ?Acute on chronic respiratory failure ([place]) Baseline on 3L with reported hypoxia to 76% at home.  ?Sepsis , POA ?Presented with tachycardia, leukocytosis, CXR findings of Left lower lobe pneumonia/pleural effusion,  seen by pulmonology, s/p thoracentesis on 4/26 with 600cc removed ?-Blood culture no growth, pleural fluid culture no growth , unfortunately pleural fluid cytology positive for adenocarcinoma ?- MRSA PCR negative recently ?- finished cefepime x7 days ?-Continue report significant dyspnea on exertion, not feeling better, appear volume overloaded, started Lasix, diuresing well ? ?Non-small cell cancer of left lung ([place]), stage III at diagnosis ?-Completed SBRT to the left   lower lobe lung mass under the care of Dr. Sondra Come completed on 10/15/2020. Currently on observation. ?-Next appointment with oncology Dr. Earlie Server 5/23, per oncology note from 4/15 "can move this appointment up if needed if cytology positive for malignant cells. 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 ? ?History of pulmonary embolism ?On eliquis at home, continue ? ?Hyponatremia, likely mutifactorial ?-appears has been having hyponatremia since  2022 ?--urine osmolarity is elevated, urine sodium is 25,  ?-he takes Cymbalta and Trileptal which could contribute to hyponatremia  ?-Sodium started to improve after starting Lasix and fluid restriction ?-Repeat BMP in the morning ? ?Hyperkalemia, K 5.2 on 4/27 ? Received Lokelma on 4/27, repeated K normalized ?Continue hold lisinopril ? ?HTN ?Presents with sepsis, continue cardizem , hold lisinopril ?Currently blood pressure stable on IV Lasix ? ?DM2 (diabetes mellitus, type 2) ([place]), on metformin at home ?Hemoglobin A1c of 7.6 in March ?Glycemic trends stable ?Cont SSI ? ?Leg paresthesia ?Reports acute bilateral feet paresthesia for past 3 days prior to admit. Strong dorsalis pedis pulses heard on bedside doppler.  ?-Numbness noted primarily at soles of B feet.  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 ?OSA (obstructive sleep apnea) ?Declined hospital CPAP ?Obesity, Class III, BMI 40-49.9 (morbid obesity) ([place]) ?BMI 43 ?Recommend diet/lifestyle modification ? ? ?Constipation, start stool softener, having BM ? ?Sacral pain /right hip pain after fall on 4/27, repeat cxr no acute findings, consider CT pelvis if pain persists ? ?FTT:  appears to have poor social support, he requested to be discharged to [facility] rehab ,transitional care made aware ? ?Subjective:  ? ?Great urine output , appears stronger , remaining edematous but has improved compared to before  ? continue c/o DOE with minimal activity ? ?Continues to complain of sacral pain and right hip pain from the fall on 4/27, will repeat pelvic  x-ray ? ? ?Date reviewed: ?BP stable ?Sodium remain low but has improved ?K normalized ?A.m. blood  glucose 187 ?Creatinine 0.8 ?Hemoglobin 9.5 ? ? ? ?Physical Exam: ?Vitals:  ? 06/26/21 1257 06/26/21 1957 06/26/21 2027 06/27/21 0528  ?BP: (!) 112/58  (!) 149/92 116/73  ?Pulse: (!) 102  98 90  ?Resp: 20  18 18   ?Temp: 97.7 ?F (36.5 ?C)  97.7 ?F (36.5 ?C) 97.7 ?F (36.5 ?C)  ?TempSrc: Oral  Oral Oral  ?SpO2: 97% 96% 99% 98%  ?Weight:      ?Height:      ? ?General exam: NAD, in no acute distress ?Respiratory system: Diminished at bases, no rales, no rhonchi, no wheezing ,normal chest rise, normal respiratory effort at rest ?Cardiovascular system: regular rhythm, s1-s2 ?Gastrointestinal system: Nondistended, nontender, pos BS ?Central nervous system: No seizures, no tremors ?Extremities: Edema right leg greater than left leg ,no cyanosis, no joint deformities ?Skin: No rashes, no pallor ?Psychiatry: Affect normal // no auditory hallucinations  ? ? ?Family Communication: Pt in room, does not appear to have great social support ,reported lives with a roommate who can get confused at times, does not have family in [place] ? ?Disposition: ? ?Requires inpatient stay because: Severity of illness ? Planned Discharge Destination: Home with HH, social worker when sodium and volume status improves ? ? ? ?Author: ?Florencia Reasons, MD PhD FACP ?06/27/2021 7:11 PM ? ?For on call review [email or web address]  ?

## 2021-06-28 ENCOUNTER — Encounter: Payer: Self-pay | Admitting: *Deleted

## 2021-06-28 DIAGNOSIS — J189 Pneumonia, unspecified organism: Secondary | ICD-10-CM | POA: Diagnosis not present

## 2021-06-28 LAB — GLUCOSE, CAPILLARY
Glucose-Capillary: 222 mg/dL — ABNORMAL HIGH (ref 70–99)
Glucose-Capillary: 190 mg/dL — ABNORMAL HIGH (ref 70–99)
Glucose-Capillary: 154 mg/dL — ABNORMAL HIGH (ref 70–99)
Glucose-Capillary: 197 mg/dL — ABNORMAL HIGH (ref 70–99)

## 2021-06-28 NOTE — TOC Progression Note (Addendum)
Transition of Care (TOC) - Progression Note  ? ?Patient Details  ?Name: RANNIE CRANEY ?MRN: [id number] ?Date of Birth: 09/27/1950 ? ?Transition of Care (TOC) CM/SW Contact  ?Sherie Don, LCSW ?Phone Number: ?06/28/2021, 2:38 PM ? ?Clinical Narrative: PT evaluation recommended SNF and patient was requesting to go to a [place] facility. However, as patient has Medicare, the [place] will require that he go to SNF under his Medicare benefits. ? ?FL2 done; PASRR pending as it will be a level II. CSW uploaded clinicals to [place] MUST for review. Patient agreeable to being referred for SNF. Initial referral faxed out. TOC awaiting PASRR number. ? ?Expected Discharge Plan: [place] ?Barriers to Discharge: Continued Medical Work up ? ?Expected Discharge Plan and Services ?Expected Discharge Plan: [place] ?Post Acute Care Choice: Home Health ?Living arrangements for the past 2 months: [place] ?HH Arranged: PT ?[place] Agency: [place] ?Date HH Agency Contacted: 06/20/21 ?Time [place]: [phone] ?Representative spoke with at [place]: [name] ? ?Readmission Risk Interventions ? ?  06/07/2021  ?  4:15 PM  ?Readmission Risk Prevention Plan  ?Transportation Screening Complete  ?Medication Review Press photographer) Complete  ?PCP or Specialist appointment within 3-5 days of discharge Complete  ?[place] or Home Care Consult Complete  ?SW Recovery Care/Counseling Consult Complete  ?Palliative Care Screening Not Applicable  ?[place] Not Applicable  ? ?

## 2021-06-28 NOTE — NC FL2 (Signed)
?[place] MEDICAID FL2 LEVEL OF CARE SCREENING TOOL  ?  ? ?IDENTIFICATION  ?Patient Name: ?Mario [name] Birthdate: [birth year] Sex: male Admission Date (Current Location): ?06/16/2021  ?[place] and [place] Number: ? [place] ?  Facility and Address:  ?[facility],  [place], [place] ?     Provider Number: ?[id number]  ?Attending Physician Name and Address:  ?Terrilee Croak, MD ? Relative Name and Phone Number:  ?Kathryne Eriksson (friend) Ph: [phone] ?   ?Current Level of Care: ?Hospital Recommended Level of Care: ?[place] Prior Approval Number: ?  ? ?Date Approved/Denied: ?  PASRR Number: ?Pending ? ?Discharge Plan: ?SNF ?  ? ?Current Diagnoses: ?Patient Active Problem List  ? Diagnosis Date Noted  ? Leg paresthesia 06/17/2021  ? Sepsis ([place]) 06/17/2021  ? Obesity, Class III, BMI 40-49.9 (morbid obesity) ([place]) 06/17/2021  ? Pneumonia 06/17/2021  ? Acute respiratory failure with hypoxia ([place]) 06/04/2021  ? Acute on chronic respiratory failure with hypoxia ([place]) 06/04/2021  ? Hypertension   ? Allergic rhinitis   ? History of pulmonary embolism   ? Acute hypoxemic respiratory failure ([place]) 05/21/2021  ? Hyponatremia 05/20/2021  ? Acute pulmonary embolism without acute cor pulmonale ([place]) 02/03/2021  ? Mood disorder ([place]) 02/03/2021  ? Acute on chronic respiratory failure ([place]) 01/09/2021  ? Non-small cell cancer of left lung ([place]) 09/21/2020  ? Lung nodule 07/30/2020  ? Mass of left lung   ? Acute respiratory failure with hypoxemia ([place]) 07/26/2020  ? Constipation   ? HCAP (healthcare-associated pneumonia) 04/03/2020  ? COPD with acute exacerbation ([place]) 01/05/2020  ? Vertigo 12/26/2019  ? 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 Stage 3a chronic kidney disease ([place]) 12/26/2019  ? Abnormal CT of the chest 12/26/2019  ? COPD (chronic obstructive pulmonary disease) ([place])   ? AKI (acute kidney injury) ([place])   ? Hyperglycemia 06/23/2019  ? OSA (obstructive sleep apnea) 06/23/2019   ? Suicidal ideation 06/23/2019  ? Major depressive disorder, recurrent episode ([place]) 10/12/2018  ? 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 Cellulitis of both feet 07/16/2017  ? DM2 (diabetes mellitus, type 2) ([name]) 07/16/2017  ? HLA B27 (HLA B27 positive) 07/16/2017  ? Acute chest pain   ? Hyperkalemia   ? Spontaneous pneumothorax 06/27/2016  ? Hypoxia   ? Pneumothorax on left   ? Dyspnea 05/23/2014  ? COPD exacerbation ([place]) 05/23/2014  ? Malingering 01/21/2013  ? Depression 01/11/2013  ? Tobacco abuse 01/11/2013  ? Obesity, unspecified 01/11/2013  ? Chest pain 01/10/2013  ? Hypertension associated with diabetes ([place]) 01/10/2013  ? ? ?Orientation RESPIRATION BLADDER Height & Weight   ?  ?Self, Time, Situation, Place ? O2 (Up to 4L/min PRN) Continent Weight: (!) 328 lb 4.2 oz (148.9 kg) ?Height:  '6\' 2"'  (188 cm)  ?BEHAVIORAL SYMPTOMS/MOOD NEUROLOGICAL BOWEL NUTRITION STATUS  ?   (N/A) Continent Diet (Carb modified)  ?AMBULATORY STATUS COMMUNICATION OF NEEDS Skin   ?Limited Assist Verbally Other (Comment) (Ecchymosis: bilateral arms; Erythema: bilateral ankles & lower legs) ?  ?  ?  ?    ?     ?     ? ? ?Personal Care Assistance Level of Assistance  ?Bathing, Feeding, Dressing Bathing Assistance: Limited assistance ?Feeding assistance: Independent ?Dressing Assistance: Limited assistance ?   ? ?Functional Limitations Info  ?Sight, Hearing, Speech Sight Info: Impaired ?Hearing Info: Adequate ?Speech Info: Adequate  ? ? ?SPECIAL CARE FACTORS FREQUENCY  ?OT (By licensed  OT), PT (By licensed PT)   ?  ?PT Frequency: 5x's/week ?OT Frequency: 5x's/week ?  ?  ?  ?   ? ? ?Contractures Contractures Info: Not present  ? ? ?Additional Factors Info  ?Code Status, Allergies, Psychotropic, Insulin Sliding Scale Code Status Info: Full ?Allergies Info: Demerol (Meperidine), Zocor (Simvastatin) ?Psychotropic Info: Wellbutrin, Buspar, Desyrel ?Insulin Sliding Scale Info: See discharge summary ?   ?   ? ?Current Medications (06/28/2021):  This is the current hospital active medication list ?Current Facility-Administered Medications  ?Medication Dose Route Frequency Provider Last Rate Last Admin  ? acetaminophen (TYLENOL) tablet 650 mg  650 mg Oral Q6H PRN Rise Patience, MD   650 mg at 06/25/21 1218  ? albuterol (PROVENTIL) (2.5 MG/3ML) 0.083% nebulizer solution 2.5 mg  2.5 mg Nebulization Q6H PRN Tu, Ching T, DO      ? apixaban (ELIQUIS) tablet 5 mg  5 mg Oral BID Florencia Reasons, MD   5 mg at 06/28/21 1049  ? arformoterol (BROVANA) nebulizer solution 15 mcg  15 mcg Nebulization BID Tu, Ching T, DO   15 mcg at 06/28/21 8333  ? aspirin EC tablet 81 mg  81 mg Oral Daily Tu, Ching T, DO   81 mg at 06/28/21 1049  ? buPROPion (WELLBUTRIN XL) 24 hr tablet 300 mg  300 mg Oral Daily Tu, Ching T, DO   300 mg at 06/28/21 1049  ? busPIRone (BUSPAR) tablet 15 mg  15 mg Oral Daily Tu, Ching T, DO   15 mg at 06/28/21 1049  ? Chlorhexidine Gluconate Cloth 2 % PADS 6 each  6 each Topical Daily Florencia Reasons, MD   6 each at 06/28/21 1150  ? clobetasol ointment (TEMOVATE) 0.05 %   Topical BID PRN Rise Patience, MD   Given at 06/27/21 [phone]  ? diltiazem (CARDIZEM CD) 24 hr capsule 180 mg  180 mg Oral Daily Tu, Ching T, DO   180 mg at 06/28/21 1049  ? DULoxetine (CYMBALTA) DR capsule 60 mg  60 mg Oral BID Tu, Ching T, DO   60 mg at 06/28/21 1049  ? feeding supplement (GLUCERNA SHAKE) (GLUCERNA SHAKE) liquid 237 mL  237 mL Oral TID BM Florencia Reasons, MD   237 mL at 06/28/21 1303  ? fluticasone (FLOVENT HFA) 44 MCG/ACT inhaler 2 puff  2 puff Inhalation BID Tu, Ching T, DO   2 puff at 06/28/21 0803  ? folic acid (FOLVITE) tablet 1 mg  1 mg Oral Daily Tu, Ching T, DO   1 mg at 06/28/21 1049  ? gabapentin (NEURONTIN) capsule 300 mg  300 mg Oral BID Donne Hazel, MD   300 mg at 06/28/21 1049  ? gabapentin (NEURONTIN) capsule 900 mg  900 mg Oral QHS Tu, Ching T, DO   900 mg at 06/27/21 2156  ? insulin aspart (novoLOG) injection 0-9 Units   0-9 Units Subcutaneous TID WC Tu, Ching T, DO   2 Units at 06/28/21 1304  ? lidocaine (LIDODERM) 5 % 1 patch  1 patch Transdermal Q24H Florencia Reasons, MD   1 patch at 06/28/21 1303  ? lidocaine (LMX) 4 % cream   Topical BID PRN Florencia Reasons, MD   Given at 06/27/21 2006  ? lip balm (CARMEX) ointment 1 application.  1 application. Topical PRN Donne Hazel, MD   1 application. at 06/19/21 1136  ? lurasidone (LATUDA) tablet 40 mg  40 mg Oral QPC supper Tu, Ching T, DO   40  mg at 06/27/21 1717  ? melatonin tablet 6 mg  6 mg Oral QHS Tu, Ching T, DO   6 mg at 06/27/21 2156  ? methocarbamol (ROBAXIN) 500 mg in dextrose 5 % 50 mL IVPB  500 mg Intravenous Q8H PRN Donne Hazel, MD   Stopped at 06/23/21 [phone]  ? methotrexate (RHEUMATREX) tablet 15 mg  15 mg Oral Weekly Freddi Starr, MD   15 mg at 06/25/21 1719  ? mycophenolate (CELLCEPT) capsule 1,500 mg  1,500 mg Oral BID Tu, Ching T, DO   1,500 mg at 06/28/21 1049  ? ondansetron (ZOFRAN) injection 4 mg  4 mg Intravenous Q6H PRN Donne Hazel, MD   4 mg at 06/25/21 1543  ? Oxcarbazepine (TRILEPTAL) tablet 600 mg  600 mg Oral BID Tu, Ching T, DO   600 mg at 06/28/21 0751  ? Oxcarbazepine (TRILEPTAL) tablet 900 mg  900 mg Oral QHS Tu, Ching T, DO   900 mg at 06/27/21 2154  ? oxyCODONE-acetaminophen (PERCOCET/ROXICET) 5-325 MG per tablet 1-2 tablet  1-2 tablet Oral Q4H PRN Donne Hazel, MD   2 tablet at 06/28/21 0751  ? polyethylene glycol (MIRALAX / GLYCOLAX) packet 17 g  17 g Oral BID Florencia Reasons, MD   17 g at 06/28/21 1050  ? prednisoLONE acetate (PRED FORTE) 1 % ophthalmic suspension 1 drop  1 drop Left Eye Q2H while awake Florencia Reasons, MD   1 drop at 06/28/21 1305  ? senna-docusate (Senokot-S) tablet 1 tablet  1 tablet Oral BID Florencia Reasons, MD   1 tablet at 06/28/21 1048  ? sodium chloride (OCEAN) 0.65 % nasal spray 1 spray  1 spray Each Nare PRN Donne Hazel, MD      ? sodium chloride flush (NS) 0.9 % injection 10-40 mL  10-40 mL Intracatheter Q12H Florencia Reasons, MD   10 mL at  06/28/21 1050  ? sodium chloride flush (NS) 0.9 % injection 10-40 mL  10-40 mL Intracatheter PRN Florencia Reasons, MD      ? tamsulosin [facility]) capsule 0.4 mg  0.4 mg Oral Daily Tu, Ching T, DO   0.4 mg at 05

## 2021-06-28 NOTE — Progress Notes (Signed)
Oncology Nurse Navigator Documentation ? ? ?  06/28/2021  ?  8:00 AM 09/22/2020  ?  3:00 PM  ?Oncology Nurse Navigator Flowsheets  ?Abnormal Finding Date  07/26/2020  ?Confirmed Diagnosis Date  07/30/2020  ?Diagnosis Status  Pending Molecular Studies  ?Planned Course of Treatment  Radiation  ?Phase of Treatment  Radiation  ?Navigator Follow Up Date:  09/24/2020  ?Navigator Follow Up Reason:  Appointment Review  ?Navigator Location [facility]  ?Navigator Encounter Type Inpatient Clinic/MDC;Initial MedOnc  ?Patient Visit Type  Initial;MedOnc  ?Treatment Phase Other Pre-Tx/Tx Discussion  ?Barriers/Navigation Needs Coordination of Care Coordination of Care;Education  ?Education  Concerns with Finances/ Eligibility;Newly Diagnosed Cancer Education;Understanding Cancer/ Treatment Options;Other  ?Interventions Coordination of Care Education;Coordination of Care;Psycho-Social Support  ?Acuity Level 3-Moderate Needs (3-4 Barriers Identified) Level 3-Moderate Needs (3-4 Barriers Identified)  ?Coordination of Care Pathology;Appts/per Dr. Julien Nordmann, patient is re-scheduled to see him next week in hopes he will be out of the hospital by then.  I notified pathology to send molecular and PDL 1 to [facility].   Other;Appts  ?Education Method  Verbal;Written  ?Time Spent with Patient 45 75  ?  ?

## 2021-06-28 NOTE — TOC PASRR Note (Signed)
30 Day PASRR Note ? ?Patient Details  ?Name: Mario [name] ?Date of Birth: 1950/03/08 ?MUST ID: [id number] ? ?Transition of Care (TOC) CM/SW Contact:    ?Sherie Don, LCSW ?Phone Number: ?06/28/2021, 2:17 PM ? ?To Whom It May Concern: ? ?Please be advised that this patient will require a short-term nursing home stay - anticipated 30 days or less for rehabilitation and strengthening. The plan is for return home. ? ?

## 2021-06-28 NOTE — Progress Notes (Signed)
Physical Therapy Treatment ?Patient Details ?Name: Mario [name] ?MRN: [id number] ?DOB: 1950-07-05 ?Today's Date: 06/28/2021 ? ? ?History of Present Illness Pt is 71 yo male admitted on 06/16/21 with acute on chronic resp failure - COPD, CAP, sepsis and hx of [place] lung CA stage II s/p radiation. Pt also with new c/o leg paresthesia.  Pt's 4th admission for similar dx in past 6 weeks.  Pt with hx including PE in 2023, OSA on CPAP, HTN, chronic back pain, hx of ankylosing spondylitis, anxiety, and depression, T2DM. ? ?  ?PT Comments  ? ? Patient found standing in room, stated that he just ambulated from BR, dyspnea 4/4. Spo2 91%. Patient rested and ambulated using RW and 4 L [place]. Spo2 91%. Resting 94%.Patient will benefit from short rehab stay to improve endurance   to be able to care for self independnetly.  ?Recommendations for follow up therapy are one component of a multi-disciplinary discharge planning process, led by the attending physician.  Recommendations may be updated based on patient status, additional functional criteria and insurance authorization. ? ?Follow Up Recommendations ? Skilled nursing-short term rehab (<3 hours/day) ?  ?  ?Assistance Recommended at Discharge    ?Patient can return home with the following A little help with bathing/dressing/bathroom;Help with stairs or ramp for entrance;Assistance with cooking/housework;Assist for transportation ?  ?Equipment Recommendations ? None recommended by PT  ?  ?Recommendations for Other Services   ? ? ?  ?Precautions / Restrictions Precautions ?Precautions: Fall ?Precaution Comments: Home oxygen 3 lts "for years"  ?  ? ?Mobility ? Bed Mobility ?  ?  ?  ?  ?  ?  ?  ?General bed mobility comments: standing at edge of bed ?  ? ?Transfers ?Overall transfer level: Needs assistance ?Equipment used: Rolling walker (2 wheels) ?Transfers: Sit to/from Stand ?Sit to Stand: Modified independent (Device/Increase time) ?  ?  ?  ?  ?  ?General transfer comment: pt safe  with sit<>stand from EOB ?  ? ?Ambulation/Gait ?Ambulation/Gait assistance: Min guard ?Gait Distance (Feet): 50 Feet ?Assistive device: Rolling walker (2 wheels) ?Gait Pattern/deviations: Decreased stride length ?Gait velocity: decr ?  ?  ?General Gait Details: patient found  coming from [place], stated he used Rw.Patient with 4/4 dyspnea. After resting, Patient ambulated on  4 L , Spo2 91?%, HR 105, used RW stated his legs were  weak. ? ? ?Stairs ?  ?  ?  ?  ?  ? ? ?Wheelchair Mobility ?  ? ?Modified Rankin (Stroke Patients Only) ?  ? ? ?  ?Balance   ?Sitting-balance support: No upper extremity supported ?Sitting balance-Leahy Scale: Good ?  ?  ?Standing balance support: During functional activity, No upper extremity supported ?Standing balance-Leahy Scale: Fair ?Standing balance comment: needs at least ! UE support ?  ?  ?  ?  ?  ?  ?  ?  ?  ?  ?  ?  ? ?  ?Cognition Arousal/Alertness: Awake/alert ?Behavior During Therapy: [facility] for tasks assessed/performed ?Overall Cognitive Status: Within Functional Limits for tasks assessed ?  ?  ?  ?  ?  ?  ?  ?  ?  ?  ?  ?  ?  ?  ?  ?  ?  ?  ?  ? ?  ?Exercises   ? ?  ?General Comments   ?  ?  ? ?Pertinent Vitals/Pain Pain Assessment ?Faces Pain Scale: Hurts a little bit ?Pain Location: chronic back pain ?Pain Descriptors /  Indicators: Discomfort ?Pain Intervention(s): Monitored during session  ? ? ?Home Living   ?  ?  ?  ?  ?  ?  ?  ?  ?  ?   ?  ?Prior Function    ?  ?  ?   ? ?PT Goals (current goals can now be found in the care plan section) Progress towards PT goals: Progressing toward goals ? ?  ?Frequency ? ? ? Min 3X/week ? ? ? ?  ?PT Plan Discharge plan needs to be updated  ? ? ?Co-evaluation   ?  ?  ?  ?  ? ?  ?AM-PAC PT "6 Clicks" Mobility   ?Outcome Measure ? Help needed turning from your back to your side while in a flat bed without using bedrails?: None ?Help needed moving from lying on your back to sitting on the side of a flat bed without using bedrails?: None ?Help  needed moving to and from a bed to a chair (including a wheelchair)?: None ?Help needed standing up from a chair using your arms (e.g., wheelchair or bedside chair)?: None ?Help needed to walk in hospital room?: A Little ?Help needed climbing 3-5 steps with a railing? : A Little ?6 Click Score: 22 ? ?  ?End of Session Equipment Utilized During Treatment: Oxygen ?Activity Tolerance: Patient tolerated treatment well ?Patient left: with bed alarm set (seated on bed edge) ?Nurse Communication: Mobility status ?PT Visit Diagnosis: Other abnormalities of gait and mobility (R26.89) ?  ? ? ?Time: 8657-8469 ?PT Time Calculation (min) (ACUTE ONLY): 22 min ? ?Charges:  $Gait Training: 8-22 mins          ?          ? ?Tresa Endo PT ?Acute Rehabilitation Services ?Pager [phone] ?Office [phone] ? ? ? ?Elven Laboy, Shella Maxim ?06/28/2021, 11:29 AM ? ?

## 2021-06-28 NOTE — Progress Notes (Signed)
?PROGRESS NOTE ? ?Mario [name]  ?DOB: 1950-09-03  ?PCP: [facility] ?JOI:[id number]  ?DOA: 06/16/2021 ? LOS: 11 days  ?Hospital Day: 13 ? ?Brief narrative: ?Mario [name] is a 71 y.o. 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   ?Patient presented to the ED on 06/16/2021 with concerns of hypoxia, nonproductive cough, bilateral leg weakness.   ? ?In the ED, he was tachycardic.  Chest x-ray showed patchy infiltrate in the left lower lung. ?Patient apparently had multiple recent admissions for recurrent pneumonia.  Pulmonary consultation was called because of nonresolving pneumonia. ? ?Recent hospitalizations: ?3/22-3/24, patient was admitted with hypoxia.  CT chest at that time showed a left sided consolidation suspicious for infection.  It was treated with Augmentin/azithromycin, steroids.  He was discharged on 4 L by nasal cannula. ?Readmitted again 3/24 to 3/27 -completed course of antibiotics. ?4/6-4/13, readmitted for right lower lobe pneumonia and left lower pleural effusion.  Completed a course of IV cefepime. ?4/19-presented again with left lung pneumonia. ? ?Subjective: ?Patient was seen and examined this morning.  Elderly Caucasian male.  Sitting up at the edge of the bed.  On 3 L oxygen at baseline.  He states that he gets short of breath even on just standing up and walking to the bathroom. ? ?Assessment and Plan: ?Persistent left lower lobe infiltrate ?Adenocarcinoma of left lung s/p SRBT ?-Completed SBRT to the left lower lobe lung mass under the care of Dr. Sondra Come completed on 10/15/2020. Currently on observation. ?-With the new findings of malignant cells seen pleural fluid, patient to follow up with Dr. Myles Gip in next 1 to 2 weeks. ? ?Sepsis secondary to recurrent  pneumonia ?-Patient with tachycardia, leukocytosis, chest x-ray finding of pneumonia. ?-Blood culture and pleural fluid culture did not show any growth. ?-Completed 7-day course of broad-spectrum antibiotics. ? ?Acute on chronic respiratory failure  ?-Currently back to baseline, on 3 L/min. ? ?History of mild systolic CHF ?Essential hypertension ?-Echo from May 2021 with EF 50 to 55%, mild LVH ?-Seems to have bilateral pedal edema.  He has received intermittent dosing of Lasix during this hospitalization.  Obtain repeat echocardiogram. ?-Home meds include Cardizem 180 mg daily, lisinopril 20 mg daily ?-Continue both. ? ?Type 2 diabetes mellitus ?-A1c 7.6 in March 2023 ?-Home meds include metformin ?-Currently metformin is on hold.  Continue sliding scale insulin with Accu-Cheks. ?Recent Labs  ?Lab 06/26/21 ?1144 06/26/21 ?1651 06/26/21 ?2021 06/28/21 ?4709 06/28/21 ?1222  ?GLUCAP 205* 231* 195* 222* 190*  ? ?History of pulmonary embolism ?-Continue Eliquis ?  ?Chronic hyponatremia ?-appears has been having hyponatremia since 2022 ?-Currently on oral Lasix for hypervolemia.  Sodium level seems low but stable. ?Recent Labs  ?Lab 06/22/21 ?0455 06/23/21 ?6283 06/24/21 ?6629 06/25/21 ?0454 06/26/21 ?0503 06/27/21 ?4765  ?NA 128* 129* 128* 128* 128* 130*  ?  ?Leg paresthesia ?Reports acute bilateral feet paresthesia for past 3 days prior to admit. Strong dorsalis pedis pulses heard on bedside doppler.  ?-Numbness noted primarily at soles of both feet.  ?-currently remains on Neurontin 300 mg twice daily as well as 900 mg at bedtime. ?  ?history of vasculitis and ankylosing spondylitis  ?-on methotrexate and mycophenolate, ?  ?OSA (obstructive sleep apnea) ?-Declined hospital CPAP ? ?Morbid obesity ?-BMI 43 ?-Recommend diet/lifestyle modification ?  ?  Constipation ?-start stool softener, having BM ?  ?Sacral pain /right hip pain  ?-after fall on 4/27, repeat cxr no acute findings, consider CT pelvis if pain persists ?   ?Failure to thrive  ?appears to have poor social support, he requested to be discharged to [facility] rehab.  Case management involved. ? ?Goals of care ?  Code Status: Full Code  ? ? ?Mobility: Limited because of shortness of breath ? ?Skin assessment:  ?  ? ?Nutritional status:  ?Body mass index is 42.15 kg/m?.  ?  ?  ? ? ? ? ?Diet:  ?Diet Order   ? ?       ?  Diet Carb Modified Fluid consistency: Thin; Room service appropriate? Yes; Fluid restriction: 1200 mL Fluid  Diet effective now       ?  ? ?  ?  ? ?  ? ? ?DVT prophylaxis:  ? ?apixaban (ELIQUIS) tablet 5 mg   ?Antimicrobials: None currently ?Fluid: None ?Consultants: Pulmonology, oncology ?Family Communication: None at bedside ? ?Status is: Inpatient ? ?Continue in-hospital care because: Continues to be short of breath on minimal exertion,  ?Level of care: Telemetry  ? ?Dispo: The patient is from: Home ?             Anticipated d/c is to: SNF likely ?             Patient currently is not medically stable to d/c. ?  Difficult to place patient No ? ? ? ? ?Infusions:  ? methocarbamol (ROBAXIN) IV Stopped (06/23/21 3267)  ? ? ?Scheduled Meds: ? apixaban  5 mg Oral BID  ? arformoterol  15 mcg Nebulization BID  ? aspirin EC  81 mg Oral Daily  ? buPROPion  300 mg Oral Daily  ? busPIRone  15 mg Oral Daily  ? Chlorhexidine Gluconate Cloth  6 each Topical Daily  ? diltiazem  180 mg Oral Daily  ? DULoxetine  60 mg Oral BID  ? feeding supplement (GLUCERNA SHAKE)  237 mL Oral TID BM  ? fluticasone  2 puff Inhalation BID  ? folic acid  1 mg Oral Daily  ? gabapentin  300 mg Oral BID  ? gabapentin  900 mg Oral QHS  ? insulin aspart  0-9 Units Subcutaneous TID WC  ? lidocaine  1 patch Transdermal Q24H  ? lurasidone  40 mg Oral QPC supper  ? melatonin  6 mg Oral QHS  ? methotrexate  15 mg Oral Weekly  ? mycophenolate  1,500 mg Oral BID  ? OXcarbazepine  600 mg Oral BID  ? oxcarbazepine  900 mg Oral QHS  ? polyethylene glycol  17 g Oral BID  ? prednisoLONE acetate  1 drop Left Eye  Q2H while awake  ? senna-docusate  1 tablet Oral BID  ? sodium chloride flush  10-40 mL Intracatheter Q12H  ? tamsulosin  0.4 mg Oral Daily  ? traMADol  100 mg Oral QHS  ? traZODone  75 mg Oral QHS  ? ? ?PRN meds: ?acetaminophen, albuterol, clobetasol ointment, lidocaine, lip balm, methocarbamol (ROBAXIN) IV, ondansetron (ZOFRAN) IV, oxyCODONE-acetaminophen, sodium chloride, sodium chloride flush  ? ?Antimicrobials: ?Anti-infectives (From admission, onward)  ? ? Start     Dose/Rate Route Frequency Ordered Stop  ? 06/17/21 0600  ceFEPIme (MAXIPIME) 2 g in sodium chloride 0.9 % 100 mL IVPB       ? 2 g ?200 mL/hr over 30 Minutes Intravenous Every 8 hours 06/17/21 0103 06/24/21 0801  ? 06/16/21  2115  vancomycin (VANCOCIN) IVPB 1000 mg/200 mL premix       ? 1,000 mg ?200 mL/hr over 60 Minutes Intravenous  Once 06/16/21 2101 06/16/21 2259  ? 06/16/21 2115  ceFEPIme (MAXIPIME) 2 g in sodium chloride 0.9 % 100 mL IVPB       ? 2 g ?200 mL/hr over 30 Minutes Intravenous  Once 06/16/21 2107 06/16/21 2146  ? ?  ? ? ?Objective: ?Vitals:  ? 06/28/21 0459 06/28/21 1358  ?BP: 112/65 129/64  ?Pulse: 92 96  ?Resp: 16 19  ?Temp: 97.6 ?F (36.4 ?C) 98.4 ?F (36.9 ?C)  ?SpO2: 98% 97%  ? ? ?Intake/Output Summary (Last 24 hours) at 06/28/2021 1448 ?Last data filed at 06/28/2021 1300 ?Gross per 24 hour  ?Intake 680 ml  ?Output 150 ml  ?Net 530 ml  ? ?Filed Weights  ? 06/16/21 1939 06/28/21 0500  ?Weight: (!) 152.1 kg (!) 148.9 kg  ? ?Weight change:  ?Body mass index is 42.15 kg/m?.  ? ?Physical Exam: ?General exam: Pleasant, elderly Caucasian male.  Sitting up at the edge of the bed.  On 3 L oxygen by nasal cannula ?Skin: No rashes, lesions or ulcers. ?HEENT: Atraumatic, normocephalic, no obvious bleeding ?Lungs: Diminished air entry in both bases ?CVS: Regular rate and rhythm, no murmur ?GI/Abd soft, nontender, nondistended, bowel sound present ?CNS: Alert, awake, oriented x3 ?Psychiatry: Whenever ?Extremities: Trace to 1+ bilateral pedal  edema, calf tenderness ? ?Data Review: I have personally reviewed the laboratory data and studies available. ? ?F/u labs ordered ?Unresulted Labs (From admission, onward)  ? ?  Start     Ordered  ? 06/29/21 0500  CBC wi

## 2021-06-29 ENCOUNTER — Inpatient Hospital Stay (HOSPITAL_COMMUNITY): Payer: No Typology Code available for payment source

## 2021-06-29 ENCOUNTER — Inpatient Hospital Stay: Payer: Self-pay

## 2021-06-29 DIAGNOSIS — R9431 Abnormal electrocardiogram [ECG] [EKG]: Secondary | ICD-10-CM | POA: Diagnosis not present

## 2021-06-29 DIAGNOSIS — J189 Pneumonia, unspecified organism: Secondary | ICD-10-CM | POA: Diagnosis not present

## 2021-06-29 LAB — BASIC METABOLIC PANEL
Anion gap: 7 (ref 5–15)
BUN: 21 mg/dL (ref 8–23)
CO2: 30 mmol/L (ref 22–32)
Calcium: 7.9 mg/dL — ABNORMAL LOW (ref 8.9–10.3)
Chloride: 92 mmol/L — ABNORMAL LOW (ref 98–111)
Creatinine, Ser: 0.75 mg/dL (ref 0.61–1.24)
GFR, Estimated: >60 mL/min (ref >60)
Glucose, Bld: 176 mg/dL — ABNORMAL HIGH (ref 70–99)
Potassium: 4.2 mmol/L (ref 3.5–5.1)
Sodium: 129 mmol/L — ABNORMAL LOW (ref 135–145)

## 2021-06-29 LAB — ECHOCARDIOGRAM COMPLETE
Height: 74 in
S' Lateral: 2.2 cm
Weight: 5252.24 oz

## 2021-06-29 LAB — CBC WITH DIFFERENTIAL/PLATELET
Abs Immature Granulocytes: 0.26 10*3/uL — ABNORMAL HIGH (ref 0.00–0.07)
Basophils Absolute: 0.1 10*3/uL (ref 0.0–0.1)
Basophils Relative: 0 %
Eosinophils Absolute: 0.3 10*3/uL (ref 0.0–0.5)
Eosinophils Relative: 3 %
HCT: 20 % — ABNORMAL LOW (ref 39.0–52.0)
Hemoglobin: 6.6 g/dL — CL (ref 13.0–17.0)
Immature Granulocytes: 2 %
Lymphocytes Relative: 16 %
Lymphs Abs: 1.8 10*3/uL (ref 0.7–4.0)
MCH: 33.3 pg (ref 26.0–34.0)
MCHC: 33 g/dL (ref 30.0–36.0)
MCV: 101 fL — ABNORMAL HIGH (ref 80.0–100.0)
Monocytes Absolute: 1.1 10*3/uL — ABNORMAL HIGH (ref 0.1–1.0)
Monocytes Relative: 10 %
Neutro Abs: 7.8 10*3/uL — ABNORMAL HIGH (ref 1.7–7.7)
Neutrophils Relative %: 69 %
Platelets: 434 10*3/uL — ABNORMAL HIGH (ref 150–400)
RBC: 1.98 MIL/uL — ABNORMAL LOW (ref 4.22–5.81)
RDW: 15.1 % (ref 11.5–15.5)
WBC: 11.3 10*3/uL — ABNORMAL HIGH (ref 4.0–10.5)
nRBC: 2 % — ABNORMAL HIGH (ref 0.0–0.2)

## 2021-06-29 LAB — GLUCOSE, CAPILLARY: Glucose-Capillary: 170 mg/dL — ABNORMAL HIGH (ref 70–99)

## 2021-06-29 LAB — PREPARE RBC (CROSSMATCH)

## 2021-06-29 LAB — ABO/RH: ABO/RH(D): O POS

## 2021-06-29 MED ORDER — SODIUM CHLORIDE 0.9% IV SOLUTION: Freq: Once | INTRAVENOUS | Status: AC

## 2021-06-29 MED ORDER — FUROSEMIDE 10 MG/ML IJ SOLN
40.0000 mg | Freq: Once | INTRAMUSCULAR | Status: AC
  Administered 2021-06-29: 40 mg via INTRAVENOUS
  Filled 2021-06-29: qty 4

## 2021-06-29 NOTE — Progress Notes (Signed)
A consult was placed to IV Therapy, as the midline that was placed on 06-24-21 was not flushing, and had no blood return; midline was found to be kinked;  attempted x 2 in the right ant forearm and RUA with ultrasound; veins give a blood return, but infiltrate with NS flushes; Lab at the bedside also, attempting to draw type and screen; pt with very poor access, and is needing a transfusion;  RN aware of no access at this time and will speak with the Dr.   ?

## 2021-06-29 NOTE — TOC Progression Note (Signed)
Transition of Care (TOC) - Progression Note  ? ? ?Patient Details  ?Name: Mario [name] ?MRN: [id number] ?Date of Birth: 09-27-1950 ? ?Transition of Care (TOC) CM/SW Contact  ?Ross Ludwig, LCSW ?Phone Number: ?06/29/2021, 3:01 PM ? ?Clinical Narrative:    ? ?Patient's [facility] number is back [id number] valid for 30 days, needs renewal 07/29/2021. ? ?Expected Discharge Plan: [place] ?Barriers to Discharge: Continued Medical Work up ? ?Expected Discharge Plan and Services ?Expected Discharge Plan: [place] ?  ?  ?Post Acute Care Choice: Home Health ?Living arrangements for the past 2 months: [place] ?                ?  ?  ?  ?  ?  ?HH Arranged: PT ?[place] Agency: [facility] ?Date HH Agency Contacted: 06/20/21 ?Time [place]: 0352 ?Representative spoke with at [place]: [name] ? ? ?Social Determinants of Health (SDOH) Interventions ?  ? ?Readmission Risk Interventions ? ?  06/07/2021  ?  4:15 PM  ?Readmission Risk Prevention Plan  ?Transportation Screening Complete  ?Medication Review Press photographer) Complete  ?PCP or Specialist appointment within 3-5 days of discharge Complete  ?[place] or Home Care Consult Complete  ?SW Recovery Care/Counseling Consult Complete  ?Palliative Care Screening Not Applicable  ?[place] Not Applicable  ? ? ?

## 2021-06-29 NOTE — Progress Notes (Signed)
?PROGRESS NOTE ? ?[name]  ?DOB: 1951/01/06  ?PCP: [facility] ?QIW:[id number]  ?DOA: 06/16/2021 ? LOS: 12 days  ?Hospital Day: 14 ? ?Brief narrative: ?ARVIL UTZ is a 71 y.o. 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   ?Patient presented to the ED on 06/16/2021 with concerns of hypoxia, nonproductive cough, bilateral leg weakness.   ? ?In the ED, he was tachycardic.  Chest x-ray showed patchy infiltrate in the left lower lung. ?Patient apparently had multiple recent admissions for recurrent pneumonia.  Pulmonary consultation was called because of nonresolving pneumonia. ? ?Recent hospitalizations: ?3/22-3/24, patient was admitted with hypoxia.  CT chest at that time showed a left sided consolidation suspicious for infection.  It was treated with Augmentin/azithromycin, steroids.  He was discharged on 4 L by nasal cannula. ?Readmitted again 3/24 to 3/27 -completed course of antibiotics. ?4/6-4/13, readmitted for right lower lobe pneumonia and left lower pleural effusion.  Completed a course of IV cefepime. ?4/19-presented again with left lung pneumonia. ? ?Subjective: ?Patient was seen and examined this morning. ?Lying down in bed.  Not in distress.  No new symptoms.   ?Labs from this morning with hemoglobin low at 6.6.  This is significantly low compared to 9.5 yesterday.  No evidence of bleeding however. ?Last bowel movement yesterday.  No blood in stool noted.  No hematuria. ? ?Assessment and Plan: ?Acute anemia ?-No acute blood loss.  Patient however has significant drop in hemoglobin this morning to 6.6. ?-Monitor PRBC transfusion ordered.  We will give him a dose of Lasix 40 mg IV before transfusion. ?Recent Labs  ?  06/17/21 ?4174 06/17/21 ?0814 06/18/21 ?0507  06/21/21 ?4818 06/22/21 ?0455 06/23/21 ?5631 06/24/21 ?4970 06/29/21 ?2637  ?HGB 13.6  --    < > 11.8* 11.8* 10.5* 9.5* 6.6*  ?MCV 97.4  --    < > 97.5 98.1 99.7 97.9 101.0*  ?VITAMINB12  --  1,064*  --   --   --   --   --   --   ?FOLATE 10.7  --   --   --   --   --   --   --   ? < > = values in this interval not displayed.  ? ?Sepsis secondary to recurrent pneumonia ?-Patient with tachycardia, leukocytosis, chest x-ray finding of pneumonia. ?-Blood culture and pleural fluid culture did not show any growth. ?-Completed 7-day course of broad-spectrum antibiotics. ? ?Acute on chronic respiratory failure  ?-Currently back to baseline, on 3 L/min. ? ?History of mild systolic CHF ?Essential hypertension ?-Echo from May 2021 with EF 50 to 55%, mild LVH ?-Seems to have bilateral pedal edema.  He has received intermittent dosing of Lasix during this hospitalization.  Pending repeat echocardiogram today. ?-Home meds include Cardizem 180 mg daily, lisinopril 20 mg daily ?-Continue both. ? ?Type 2 diabetes mellitus ?-A1c 7.6 in March 2023 ?-Home meds include metformin ?-Currently metformin is on hold.  Continue sliding scale insulin with Accu-Cheks. ?Recent Labs  ?Lab 06/26/21 ?2021 06/28/21 ?8588 06/28/21 ?1222 06/28/21 ?1705 06/28/21 ?2127  ?GLUCAP 195* 222* 190* 154* 197*  ? ?History of pulmonary embolism ?-Chronically on Eliquis.  Continued on the same currently.  No active bleeding.  However if hemoglobin continues to drop, may need to consider stopping Eliquis. ?  ?  Chronic hyponatremia ?-appears has been having hyponatremia since 2022 ?-Currently on oral Lasix for hypervolemia.  Sodium level seems low but stable. ?Recent Labs  ?Lab 06/23/21 ?1287 06/24/21 ?8676 06/25/21 ?0454 06/26/21 ?0503 06/27/21 ?7209 06/29/21 ?0512  ?NA 129* 128* 128* 128* 130* 129*  ? ?Persistent left lower lobe infiltrate ?Adenocarcinoma of left lung s/p SRBT ?-Completed SBRT to the left lower lobe lung mass under the care of Dr. Sondra Come completed  on 10/15/2020. Currently on observation. ?-With the new findings of malignant cells seen pleural fluid, patient to follow up with Dr. Myles Gip in next 1 to 2 weeks. ?-Patient has poor functional ability.  Gets short of breath easily.  We will get palliative care consultation. ?  ?Leg paresthesia ?Reports acute bilateral feet paresthesia for past 3 days prior to admit. Strong dorsalis pedis pulses heard on bedside doppler.  ?-Numbness noted primarily at soles of both feet.  ?-currently remains on Neurontin 300 mg twice daily as well as 900 mg at bedtime. ?  ?history of vasculitis and ankylosing spondylitis  ?-on methotrexate and mycophenolate, ?  ?OSA (obstructive sleep apnea) ?-Declined hospital CPAP ? ?Morbid obesity ?-BMI 43 ?-Recommend diet/lifestyle modification ?  ?Constipation ?-Continue stool softeners and as needed laxatives ?  ?Sacral pain /right hip pain  ?-after fall on 4/27, repeat cxr no acute findings, consider CT pelvis if pain persists ?  ?Goals of care ?  Code Status: Full Code  ? ? ?Mobility: Limited because of shortness of breath ? ?Skin assessment:  ?  ? ?Nutritional status:  ?Body mass index is 42.15 kg/m?.  ?  ?  ? ? ? ? ?Diet:  ?Diet Order   ? ?       ?  Diet Carb Modified Fluid consistency: Thin; Room service appropriate? Yes; Fluid restriction: 1200 mL Fluid  Diet effective now       ?  ? ?  ?  ? ?  ? ? ?DVT prophylaxis:  ? ?apixaban (ELIQUIS) tablet 5 mg   ?Antimicrobials: None currently ?Fluid: None ?Consultants: Pulmonology, oncology, Perative care consult ordered ?Family Communication: None at bedside ? ?Status is: Inpatient ? ?Continue in-hospital care because: Continues to be short of breath on minimal exertion,  ?Level of care: Telemetry  ? ?Dispo: The patient is from: Home ?             Anticipated d/c is to: SNF likely ?             Patient currently is not medically stable to d/c. ?  Difficult to place patient No ? ? ? ? ?Infusions:  ? methocarbamol (ROBAXIN) IV Stopped (06/23/21  4709)  ? ? ?Scheduled Meds: ? sodium chloride   Intravenous Once  ? apixaban  5 mg Oral BID  ? arformoterol  15 mcg Nebulization BID  ? aspirin EC  81 mg Oral Daily  ? buPROPion  300 mg Oral Daily  ? busPIRone  15 mg Oral Daily  ? Chlorhexidine Gluconate Cloth  6 each Topical Daily  ? diltiazem  180 mg Oral Daily  ? DULoxetine  60 mg Oral BID  ? feeding supplement (GLUCERNA SHAKE)  237 mL Oral TID BM  ? fluticasone  2 puff Inhalation BID  ? folic acid  1 mg Oral Daily  ? furosemide  40 mg Intravenous Once  ? gabapentin  300 mg Oral BID  ? gabapentin  900 mg Oral QHS  ? insulin aspart  0-9 Units Subcutaneous TID WC  ? lidocaine  1  patch Transdermal Q24H  ? lurasidone  40 mg Oral QPC supper  ? melatonin  6 mg Oral QHS  ? methotrexate  15 mg Oral Weekly  ? mycophenolate  1,500 mg Oral BID  ? OXcarbazepine  600 mg Oral BID  ? oxcarbazepine  900 mg Oral QHS  ? polyethylene glycol  17 g Oral BID  ? prednisoLONE acetate  1 drop Left Eye Q2H while awake  ? senna-docusate  1 tablet Oral BID  ? sodium chloride flush  10-40 mL Intracatheter Q12H  ? tamsulosin  0.4 mg Oral Daily  ? traMADol  100 mg Oral QHS  ? traZODone  75 mg Oral QHS  ? ? ?PRN meds: ?acetaminophen, albuterol, clobetasol ointment, lidocaine, lip balm, methocarbamol (ROBAXIN) IV, ondansetron (ZOFRAN) IV, oxyCODONE-acetaminophen, sodium chloride, sodium chloride flush  ? ?Antimicrobials: ?Anti-infectives (From admission, onward)  ? ? Start     Dose/Rate Route Frequency Ordered Stop  ? 06/17/21 0600  ceFEPIme (MAXIPIME) 2 g in sodium chloride 0.9 % 100 mL IVPB       ? 2 g ?200 mL/hr over 30 Minutes Intravenous Every 8 hours 06/17/21 0103 06/24/21 0801  ? 06/16/21 2115  vancomycin (VANCOCIN) IVPB 1000 mg/200 mL premix       ? 1,000 mg ?200 mL/hr over 60 Minutes Intravenous  Once 06/16/21 2101 06/16/21 2259  ? 06/16/21 2115  ceFEPIme (MAXIPIME) 2 g in sodium chloride 0.9 % 100 mL IVPB       ? 2 g ?200 mL/hr over 30 Minutes Intravenous  Once 06/16/21 2107 06/16/21  2146  ? ?  ? ? ?Objective: ?Vitals:  ? 06/29/21 0402 06/29/21 0850  ?BP: 112/64   ?Pulse: 89   ?Resp: 15   ?Temp: (!) 97.4 ?F (36.3 ?C)   ?SpO2: 97% 93%  ? ? ?Intake/Output Summary (Last 24 hours) at 06/29/2021 1322 ?

## 2021-06-30 ENCOUNTER — Inpatient Hospital Stay (HOSPITAL_COMMUNITY): Payer: No Typology Code available for payment source

## 2021-06-30 DIAGNOSIS — J441 Chronic obstructive pulmonary disease with (acute) exacerbation: Secondary | ICD-10-CM | POA: Diagnosis not present

## 2021-06-30 DIAGNOSIS — J189 Pneumonia, unspecified organism: Secondary | ICD-10-CM | POA: Diagnosis not present

## 2021-06-30 DIAGNOSIS — Z7189 Other specified counseling: Secondary | ICD-10-CM

## 2021-06-30 DIAGNOSIS — Z515 Encounter for palliative care: Secondary | ICD-10-CM

## 2021-06-30 DIAGNOSIS — C3492 Malignant neoplasm of unspecified part of left bronchus or lung: Secondary | ICD-10-CM | POA: Diagnosis not present

## 2021-06-30 LAB — CBC WITH DIFFERENTIAL/PLATELET
Abs Immature Granulocytes: 0.18 10*3/uL — ABNORMAL HIGH (ref 0.00–0.07)
Basophils Absolute: 0.1 10*3/uL (ref 0.0–0.1)
Basophils Relative: 1 %
Eosinophils Absolute: 0.2 10*3/uL (ref 0.0–0.5)
Eosinophils Relative: 2 %
HCT: 21.2 % — ABNORMAL LOW (ref 39.0–52.0)
Hemoglobin: 6.9 g/dL — CL (ref 13.0–17.0)
Immature Granulocytes: 2 %
Lymphocytes Relative: 14 %
Lymphs Abs: 1.5 10*3/uL (ref 0.7–4.0)
MCH: 32.4 pg (ref 26.0–34.0)
MCHC: 32.5 g/dL (ref 30.0–36.0)
MCV: 99.5 fL (ref 80.0–100.0)
Monocytes Absolute: 1.2 10*3/uL — ABNORMAL HIGH (ref 0.1–1.0)
Monocytes Relative: 11 %
Neutro Abs: 7.6 10*3/uL (ref 1.7–7.7)
Neutrophils Relative %: 70 %
Platelets: 410 10*3/uL — ABNORMAL HIGH (ref 150–400)
RBC: 2.13 MIL/uL — ABNORMAL LOW (ref 4.22–5.81)
RDW: 16 % — ABNORMAL HIGH (ref 11.5–15.5)
WBC: 10.7 10*3/uL — ABNORMAL HIGH (ref 4.0–10.5)
nRBC: 1.9 % — ABNORMAL HIGH (ref 0.0–0.2)

## 2021-06-30 LAB — PREPARE RBC (CROSSMATCH)

## 2021-06-30 LAB — BASIC METABOLIC PANEL
Anion gap: 7 (ref 5–15)
BUN: 21 mg/dL (ref 8–23)
CO2: 30 mmol/L (ref 22–32)
Calcium: 8 mg/dL — ABNORMAL LOW (ref 8.9–10.3)
Chloride: 93 mmol/L — ABNORMAL LOW (ref 98–111)
Creatinine, Ser: 0.84 mg/dL (ref 0.61–1.24)
GFR, Estimated: >60 mL/min (ref >60)
Glucose, Bld: 220 mg/dL — ABNORMAL HIGH (ref 70–99)
Potassium: 4 mmol/L (ref 3.5–5.1)
Sodium: 130 mmol/L — ABNORMAL LOW (ref 135–145)

## 2021-06-30 LAB — GLUCOSE, CAPILLARY
Glucose-Capillary: 172 mg/dL — ABNORMAL HIGH (ref 70–99)
Glucose-Capillary: 185 mg/dL — ABNORMAL HIGH (ref 70–99)
Glucose-Capillary: 150 mg/dL — ABNORMAL HIGH (ref 70–99)

## 2021-06-30 LAB — HEMOGLOBIN AND HEMATOCRIT, BLOOD
HCT: 25 % — ABNORMAL LOW (ref 39.0–52.0)
Hemoglobin: 8.4 g/dL — ABNORMAL LOW (ref 13.0–17.0)

## 2021-06-30 LAB — OCCULT BLOOD X 1 CARD TO LAB, STOOL: Fecal Occult Bld: POSITIVE — AB

## 2021-06-30 MED ORDER — FUROSEMIDE 10 MG/ML IJ SOLN
40.0000 mg | Freq: Every day | INTRAMUSCULAR | Status: DC
  Administered 2021-06-30 – 2021-07-05 (×6): 40 mg via INTRAVENOUS
  Filled 2021-06-30 (×6): qty 4

## 2021-06-30 NOTE — Progress Notes (Signed)
?PROGRESS NOTE ? ?[name]  ?DOB: 04/26/1950  ?PCP: [facility] ?OZD:[id number]  ?DOA: 06/16/2021 ? LOS: 13 days  ?Hospital Day: 15 ? ?Brief narrative: ?Mario [name] is a 71 y.o. 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   ?Patient presented to the ED on 06/16/2021 with concerns of hypoxia, nonproductive cough, bilateral leg weakness.   ? ?In the ED, he was tachycardic.  Chest x-ray showed patchy infiltrate in the left lower lung. ?Patient apparently had multiple recent admissions for recurrent pneumonia.  Pulmonary consultation was called because of nonresolving pneumonia. ? ?Recent hospitalizations: ?3/22-3/24, patient was admitted with hypoxia.  CT chest at that time showed a left sided consolidation suspicious for infection.  It was treated with Augmentin/azithromycin, steroids.  He was discharged on 4 L by nasal cannula. ?Readmitted again 3/24 to 3/27 -completed course of antibiotics. ?4/6-4/13, readmitted for right lower lobe pneumonia and left lower pleural effusion.  Completed a course of IV cefepime. ?4/19-presented again with left lung pneumonia. ? ?Subjective: ?Patient was seen and examined this morning.   ?Lying down in bed.  Not in distress.  On low-flow oxygen.  Bowel movement yesterday without any noticeable blood.  But his hemoglobin continues to remain low.  It was 6.6 yesterday after which 1 unit of PRBC was transfused.  Hemoglobin did not improve significantly.  It is 6.9 today.  1 morning of PRBC transfused.  Repeat hemoglobin tomorrow. ? ?Assessment and Plan: ?Acute anemia ?-No acute blood loss.  Patient however has significant drop in hemoglobin this morning to 6.6. ?-Monitor PRBC transfusion ordered.  We will give him a dose of Lasix 40 mg IV before  transfusion. ?Recent Labs  ?  06/17/21 ?2595 06/17/21 ?6387 06/18/21 ?0507 06/22/21 ?0455 06/23/21 ?5643 06/24/21 ?3295 06/29/21 ?1884 06/30/21 ?1660  ?HGB 13.6  --    < > 11.8* 10.5* 9.5* 6.6* 6.9*  ?MCV 97.4  --    < > 98.1 99.7 97.9 101.0* 99.5  ?VITAMINB12  --  1,064*  --   --   --   --   --   --   ?FOLATE 10.7  --   --   --   --   --   --   --   ? < > = values in this interval not displayed.  ? ?Sepsis secondary to recurrent pneumonia ?-Patient with tachycardia, leukocytosis, chest x-ray finding of pneumonia. ?-Blood culture and pleural fluid culture did not show any growth. ?-Completed 7-day course of broad-spectrum antibiotics. ? ?Persistent left lower lobe infiltrate ?Adenocarcinoma of left lung s/p SRBT ?-Completed SBRT to the left lower lobe lung mass under the care of Dr. Sondra Come completed on 10/15/2020. Currently on observation. ?-With the new findings of malignant cells seen pleural fluid, patient to follow up with Dr. Myles Gip in next 1 to 2 weeks. ?-Patient has poor functional ability.  Gets short of breath easily.   ?-Obtain repeat x-ray today to see if patient has had reaccumulation of pleural effusion again. ?-Palliative care consulted. ? ?Acute on chronic respiratory failure  ?-Currently back to baseline, on 3 L/min. ? ?History of mild systolic CHF ?Essential hypertension ?-Echo from May 2021 with EF 50 to 55%, mild LVH ?-Seems to have bilateral pedal edema.  He has received intermittent dosing of Lasix during  this hospitalization.  Repeat echo 5/2 showed an improvement in EF to 66 5%. ?-IV Lasix given yesterday and today.  Continue to monitor hemodynamics and renal function. ?-Home meds include Cardizem 180 mg daily, lisinopril 20 mg daily ?-Continue both. ? ?Type 2 diabetes mellitus ?-A1c 7.6 in March 2023 ?-Home meds include metformin ?-Currently metformin is on hold.  Continue sliding scale insulin with Accu-Cheks. ?Recent Labs  ?Lab 06/28/21 ?1222 06/28/21 ?1705 06/28/21 ?2127 06/29/21 ?2123  06/30/21 ?0803  ?GLUCAP 190* 154* 197* 170* 172*  ? ?History of pulmonary embolism ?-Patient apparently had pulm embolism in December 2022.  He has been on Eliquis since then.  With current drop in hemoglobin, I would hold Eliquis.  Because of his underlying cancer, he is at high risk of recurrence of DVT.  If his hemoglobin allows, plan to resume Eliquis in 1 to 2 days. ?  ?Chronic hyponatremia ?-appears has been having hyponatremia since 2022 ?-Currently on oral Lasix for hypervolemia.  Sodium level seems low but stable. ?Recent Labs  ?Lab 06/24/21 ?0816 06/25/21 ?7169 06/26/21 ?0503 06/27/21 ?6789 06/29/21 ?3810 06/30/21 ?0352  ?NA 128* 128* 128* 130* 129* 130*  ? ?Leg paresthesia ?Reports acute bilateral feet paresthesia for past 3 days prior to admit. Strong dorsalis pedis pulses heard on bedside doppler.  ?-Numbness noted primarily at soles of both feet.  ?-currently remains on Neurontin 300 mg twice daily as well as 900 mg at bedtime. ?  ?history of vasculitis and ankylosing spondylitis  ?-on methotrexate and mycophenolate, ?  ?OSA (obstructive sleep apnea) ?-Declined hospital CPAP ? ?Morbid obesity ?-BMI 43 ?-Recommend diet/lifestyle modification ?  ?Constipation ?-Continue stool softeners and as needed laxatives ?  ?Sacral pain /right hip pain  ?-after fall on 4/27, repeat cxr no acute findings, consider CT pelvis if pain persists ?  ?Goals of care ?  Code Status: Full Code  ? ? ?Mobility: Limited because of shortness of breath ? ?Skin assessment:  ?  ? ?Nutritional status:  ?Body mass index is 42.15 kg/m?.  ?  ?  ? ? ? ? ?Diet:  ?Diet Order   ? ?       ?  Diet Carb Modified Fluid consistency: Thin; Room service appropriate? Yes; Fluid restriction: 1200 mL Fluid  Diet effective now       ?  ? ?  ?  ? ?  ? ? ?DVT prophylaxis:  ?Place and maintain sequential compression device Start: 06/30/21 0828 ?  ?Antimicrobials: None currently ?Fluid: None ?Consultants: Pulmonology, oncology, Perative care consult  ordered ?Family Communication: None at bedside ? ?Status is: Inpatient ? ?Continue in-hospital care because: Continues to be short of breath on minimal exertion, hemoglobin dropping. ?Level of care: Telemetry  ? ?Dispo: The patient is from: Home ?             Anticipated d/c is to: SNF likely ?             Patient currently is not medically stable to d/c. ?  Difficult to place patient No ? ? ? ? ?Infusions:  ? methocarbamol (ROBAXIN) IV Stopped (06/23/21 1751)  ? ? ?Scheduled Meds: ? arformoterol  15 mcg Nebulization BID  ? aspirin EC  81 mg Oral Daily  ? buPROPion  300 mg Oral Daily  ? busPIRone  15 mg Oral Daily  ? Chlorhexidine Gluconate Cloth  6 each Topical Daily  ? diltiazem  180 mg Oral Daily  ? DULoxetine  60 mg Oral BID  ? feeding supplement (  GLUCERNA SHAKE)  237 mL Oral TID BM  ? fluticasone  2 puff Inhalation BID  ? folic acid  1 mg Oral Daily  ? furosemide  40 mg Intravenous Daily  ? gabapentin  300 mg Oral BID  ? gabapentin  900 mg Oral QHS  ? insulin aspart  0-9 Units Subcutaneous TID WC  ? lidocaine  1 patch Transdermal Q24H  ? lurasidone  40 mg Oral QPC supper  ? melatonin  6 mg Oral QHS  ? methotrexate  15 mg Oral Weekly  ? mycophenolate  1,500 mg Oral BID  ? OXcarbazepine  600 mg Oral BID  ? oxcarbazepine  900 mg Oral QHS  ? polyethylene glycol  17 g Oral BID  ? prednisoLONE acetate  1 drop Left Eye Q2H while awake  ? senna-docusate  1 tablet Oral BID  ? sodium chloride flush  10-40 mL Intracatheter Q12H  ? tamsulosin  0.4 mg Oral Daily  ? traMADol  100 mg Oral QHS  ? traZODone  75 mg Oral QHS  ? ? ?PRN meds: ?acetaminophen, albuterol, clobetasol ointment, lidocaine, lip balm, methocarbamol (ROBAXIN) IV, ondansetron (ZOFRAN) IV, oxyCODONE-acetaminophen, sodium chloride, sodium chloride flush  ? ?Antimicrobials: ?Anti-infectives (From admission, onward)  ? ? Start     Dose/Rate Route Frequency Ordered Stop  ? 06/17/21 0600  ceFEPIme (MAXIPIME) 2 g in sodium chloride 0.9 % 100 mL IVPB       ? 2  g ?200 mL/hr over 30 Minutes Intravenous Every 8 hours 06/17/21 0103 06/24/21 0801  ? 06/16/21 2115  vancomycin (VANCOCIN) IVPB 1000 mg/200 mL premix       ? 1,000 mg ?200 mL/hr over 60 Minutes Intravenous  Once 06/16/21 21

## 2021-06-30 NOTE — TOC Progression Note (Addendum)
Transition of Care (TOC) - Progression Note  ? ? ?Patient Details  ?Name: Mario [name] ?MRN: [id number] ?Date of Birth: [birth year] ? ?Transition of Care (TOC) CM/SW Contact  ?Tawanna Cooler, RN ?Phone Number: ?06/30/2021, 2:34 PM ? ?Clinical Narrative:    ? ?Met with patient at bedside to give him the bed offers.  Patient isn't sure who to choose, isn't very familiar with the facilities.  Encouraged him to look at [email or web address] and ask friends or family to help decide where he wants to go.  Educated him that CM is unable to make a recommendation.  ?CM to check later today to see if he has made a decision.  ? ?1645: Spoke with patient, his choice is [facility].  Messaged admission rep to let her know they are patient choice.  [facility] and started [place].  Reference number [id number].   ? ? ?Expected Discharge Plan: [place] ?Barriers to Discharge: Continued Medical Work up ? ?Expected Discharge Plan and Services ?Expected Discharge Plan: [place] ?  ?  ?Post Acute Care Choice: Home Health ?Living arrangements for the past 2 months: [place] ?                ?   ?HH Arranged: PT ?[place] Agency: [place] ?Date HH Agency Contacted: 06/20/21 ?Time [place]: [id number] ?Representative spoke with at [place]: [name] ? ? ?Readmission Risk Interventions ? ?  06/07/2021  ?  4:15 PM  ?Readmission Risk Prevention Plan  ?Transportation Screening Complete  ?Medication Review Press photographer) Complete  ?PCP or Specialist appointment within 3-5 days of discharge Complete  ?[place] or Home Care Consult Complete  ?SW Recovery Care/Counseling Consult Complete  ?Palliative Care Screening Not Applicable  ?[place] Not Applicable  ? ? ?

## 2021-06-30 NOTE — Progress Notes (Signed)
Critical hgb of 6.9 reported. The patient received 1 unit blood on 5/2 at hs for a hgb of 6.6. Patient is on Eliquis. Messaged Gershon Cull. ?

## 2021-07-01 ENCOUNTER — Encounter: Payer: Self-pay | Admitting: *Deleted

## 2021-07-01 DIAGNOSIS — J189 Pneumonia, unspecified organism: Secondary | ICD-10-CM | POA: Diagnosis not present

## 2021-07-01 LAB — CBC WITH DIFFERENTIAL/PLATELET
Abs Immature Granulocytes: 0.16 10*3/uL — ABNORMAL HIGH (ref 0.00–0.07)
Basophils Absolute: 0.1 10*3/uL (ref 0.0–0.1)
Basophils Relative: 1 %
Eosinophils Absolute: 0.2 10*3/uL (ref 0.0–0.5)
Eosinophils Relative: 3 %
HCT: 25.2 % — ABNORMAL LOW (ref 39.0–52.0)
Hemoglobin: 8.1 g/dL — ABNORMAL LOW (ref 13.0–17.0)
Immature Granulocytes: 2 %
Lymphocytes Relative: 17 %
Lymphs Abs: 1.6 10*3/uL (ref 0.7–4.0)
MCH: 31.3 pg (ref 26.0–34.0)
MCHC: 32.1 g/dL (ref 30.0–36.0)
MCV: 97.3 fL (ref 80.0–100.0)
Monocytes Absolute: 1.1 10*3/uL — ABNORMAL HIGH (ref 0.1–1.0)
Monocytes Relative: 11 %
Neutro Abs: 6.2 10*3/uL (ref 1.7–7.7)
Neutrophils Relative %: 66 %
Platelets: 434 10*3/uL — ABNORMAL HIGH (ref 150–400)
RBC: 2.59 MIL/uL — ABNORMAL LOW (ref 4.22–5.81)
RDW: 18.5 % — ABNORMAL HIGH (ref 11.5–15.5)
WBC: 9.3 10*3/uL (ref 4.0–10.5)
nRBC: 0.8 % — ABNORMAL HIGH (ref 0.0–0.2)

## 2021-07-01 LAB — BPAM RBC
Blood Product Expiration Date: 202305282359
Blood Product Expiration Date: 202305312359
ISSUE DATE / TIME: 202305022043
ISSUE DATE / TIME: 202305030609
Unit Type and Rh: 5100
Unit Type and Rh: 5100

## 2021-07-01 LAB — GLUCOSE, CAPILLARY
Glucose-Capillary: 189 mg/dL — ABNORMAL HIGH (ref 70–99)
Glucose-Capillary: 158 mg/dL — ABNORMAL HIGH (ref 70–99)
Glucose-Capillary: 195 mg/dL — ABNORMAL HIGH (ref 70–99)
Glucose-Capillary: 186 mg/dL — ABNORMAL HIGH (ref 70–99)
Glucose-Capillary: 183 mg/dL — ABNORMAL HIGH (ref 70–99)
Glucose-Capillary: 189 mg/dL — ABNORMAL HIGH (ref 70–99)
Glucose-Capillary: 183 mg/dL — ABNORMAL HIGH (ref 70–99)
Glucose-Capillary: 226 mg/dL — ABNORMAL HIGH (ref 70–99)
Glucose-Capillary: 143 mg/dL — ABNORMAL HIGH (ref 70–99)
Glucose-Capillary: 163 mg/dL — ABNORMAL HIGH (ref 70–99)
Glucose-Capillary: 157 mg/dL — ABNORMAL HIGH (ref 70–99)

## 2021-07-01 LAB — TYPE AND SCREEN
ABO/RH(D): O POS
Antibody Screen: NEGATIVE
Unit division: 0
Unit division: 0

## 2021-07-01 LAB — IRON AND TIBC
Iron: 36 ug/dL — ABNORMAL LOW (ref 45–182)
Saturation Ratios: 9 % — ABNORMAL LOW (ref 17.9–39.5)
TIBC: 415 ug/dL (ref 250–450)
UIBC: 379 ug/dL

## 2021-07-01 LAB — BASIC METABOLIC PANEL
Anion gap: 9 (ref 5–15)
BUN: 21 mg/dL (ref 8–23)
CO2: 30 mmol/L (ref 22–32)
Calcium: 8.5 mg/dL — ABNORMAL LOW (ref 8.9–10.3)
Chloride: 93 mmol/L — ABNORMAL LOW (ref 98–111)
Creatinine, Ser: 0.74 mg/dL (ref 0.61–1.24)
GFR, Estimated: >60 mL/min (ref >60)
Glucose, Bld: 179 mg/dL — ABNORMAL HIGH (ref 70–99)
Potassium: 4 mmol/L (ref 3.5–5.1)
Sodium: 132 mmol/L — ABNORMAL LOW (ref 135–145)

## 2021-07-01 LAB — FERRITIN: Ferritin: 123 ng/mL (ref 24–336)

## 2021-07-01 MED ORDER — PANTOPRAZOLE SODIUM 40 MG IV SOLR
40.0000 mg | INTRAVENOUS | Status: DC
  Administered 2021-07-01 – 2021-07-02 (×2): 40 mg via INTRAVENOUS
  Filled 2021-07-01 (×2): qty 10

## 2021-07-01 NOTE — Consult Note (Signed)
Referring Provider: [facility] ?Primary Care Physician:  [facility] ?Primary Gastroenterologist:  Althia Forts ? ?Reason for Consultation:  Anemia, positive FOBT ? ?HPI: Mario [name] is a 71 y.o. 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  Patient presented to the ED 06/16/2021.  Currently being treated for recurrent pneumonia.  ? ?2 days ago hemoglobin 6.6.  After 2 units of packed red blood cells now 8.1.  10 days ago hemoglobin was 11.8.  He notes his shortness of breath has been increasing over the last 6 months.  ?Patient is currently on 3 L O2 via nasal cannula.  States that at home he only uses oxygen at night and with exertion.  ?Positive FOBT 06/30/2021 ?Denies Melena, hematochezia, hematemesis, vomiting, diarrhea, abdominal pain, weight loss.  ? ?No previous EGD, previously had colonoscopy at [facility]. Denies family history of colon cancer. ? ?He does not take any iron, does take folic acid at home. ?Last Eliquis dose 5/2 ?Denies NSAID use, denies alcohol, denies smoking. ? ?Colonoscopy 04/19/2016. [place] ?The scope needed to be retracted, due to an equipment malfunction (NO INSUFFLATION). The colonoscope was used in its place. The scope was withdrawn and the mucosa was carefully examined. The quality of the  ?preparation was fair. ?Scope was significant for diverticulosis. ?recommended CT colonography in 1 year. ? ?Past Medical History:  ?Diagnosis Date  ? Anxiety   ? Bronchitis   ? COPD (chronic obstructive pulmonary disease) ([place])   ? Depression   ? History of radiation therapy   ? Left lung- 10/05/20-10/15/20- Dr. Gery Pray  ? Hypertension   ? lung ca 09/2020  ? Mental disorder   ? MI (myocardial infarction) ([place])   ? ????  ? OSA (obstructive sleep apnea)    ? Suicide attempt [facility])   ? Tension pneumothorax 06/27/2016  ? Uveitis   ? ? ?Past Surgical History:  ?Procedure Laterality Date  ? BRONCHIAL BIOPSY  07/30/2020  ? Procedure: BRONCHIAL BIOPSIES;  Surgeon: Garner Nash, DO;  Location: [facility];  Service: Pulmonary;;  ? BRONCHIAL BRUSHINGS  07/30/2020  ? Procedure: BRONCHIAL BRUSHINGS;  Surgeon: Garner Nash, DO;  Location: [place];  Service: Pulmonary;;  ? BRONCHIAL NEEDLE ASPIRATION BIOPSY  07/30/2020  ? Procedure: BRONCHIAL NEEDLE ASPIRATION BIOPSIES;  Surgeon: Garner Nash, DO;  Location: [place];  Service: Pulmonary;;  ? BRONCHIAL WASHINGS  07/30/2020  ? Procedure: BRONCHIAL WASHINGS;  Surgeon: Garner Nash, DO;  Location: [place];  Service: Pulmonary;;  ? CHEST TUBE INSERTION Left 06/27/2016  ? cryptorchidism    ? SKIN CANCER EXCISION    ? VIDEO BRONCHOSCOPY WITH ENDOBRONCHIAL NAVIGATION Left 07/30/2020  ? Procedure: VIDEO BRONCHOSCOPY WITH ENDOBRONCHIAL NAVIGATION;  Surgeon: Garner Nash, DO;  Location: [place];  Service: Pulmonary;  Laterality: Left;  ? ? ?Prior to Admission medications   ?Medication Sig Start Date End Date Taking? Authorizing Provider  ?albuterol (PROVENTIL) (2.5 MG/3ML) 0.083% nebulizer solution Inhale 3 mLs (2.5 mg total) by nebulization every 4 (four) hours as needed for wheezing or shortness of breath. ?Patient taking differently: Take 2.5 mg by nebulization every 6 (six) hours as needed for wheezing or shortness of breath. 08/04/20 08/04/21 Yes Lavina Hamman, MD  ?albuterol (VENTOLIN HFA) 108 (90 Base) MCG/ACT inhaler Inhale 2 puffs into  the lungs every 6 (six) hours as needed for wheezing. 06/02/20  Yes [provider]  ?apixaban (ELIQUIS) 5 MG TABS tablet Take 5 mg by mouth 2 (two) times daily.   Yes [provider]  ?aspirin 81 MG EC tablet Take 81 mg by mouth daily.   Yes [provider]  ?b complex vitamins capsule Take 1 capsule by mouth daily.   Yes [provider]   ?buPROPion (WELLBUTRIN XL) 300 MG 24 hr tablet Take 300 mg by mouth daily. 02/05/20  Yes [provider]  ?busPIRone (BUSPAR) 15 MG tablet Take 15 mg by mouth daily.   Yes [provider]  ?calcium-vitamin D (OSCAL WITH D) 500-200 MG-UNIT tablet Take 1 tablet by mouth 2 (two) times daily with a meal.   Yes [provider]  ?carboxymethylcellulose (REFRESH PLUS) 0.5 % SOLN Place 1 drop into both eyes 4 (four) times daily as needed (dry eyes).   Yes [provider]  ?Cholecalciferol (VITAMIN D-3) 25 MCG (1000 UT) CAPS Take 1,000 Units by mouth 2 (two) times daily.   Yes [provider]  ?clobetasol ointment (TEMOVATE) 1.96 % Apply 1 application. topically 2 (two) times daily as needed (for scars).   Yes [provider]  ?clotrimazole-betamethasone (LOTRISONE) cream Apply topically 2 (two) times daily. ?Patient taking differently: Apply 1 application. topically 2 (two) times daily as needed (fungus). 05/21/21  Yes Farrel Gordon, DO  ?cyclobenzaprine (FLEXERIL) 10 MG tablet Take 30 mg by mouth at bedtime.   Yes [provider]  ?cycloSPORINE (RESTASIS) 0.05 % ophthalmic emulsion Place 1 drop into both eyes 2 (two) times daily.   Yes [provider]  ?diltiazem (CARDIZEM CD) 180 MG 24 hr capsule Take 180 mg by mouth daily.   Yes [provider]  ?docusate sodium (COLACE) 100 MG capsule Take 2 capsules (200 mg total) by mouth 2 (two) times daily. ?Patient taking differently: Take 200 mg by mouth daily. 08/04/20  Yes Lavina Hamman, MD  ?DULoxetine (CYMBALTA) 60 MG capsule Take 1 capsule (60 mg total) by mouth 2 (two) times daily. 07/21/17  Yes Lavina Hamman, MD  ?feeding supplement (ENSURE ENLIVE / ENSURE PLUS) LIQD Take 237 mLs by mouth 3 (three) times daily between meals. ?Patient taking differently: Take 1 Bottle by mouth daily. 08/04/20  Yes Lavina Hamman, MD  ?fluticasone Asencion Islam) 50 MCG/ACT nasal spray Place 2 sprays into both nostrils  daily.    Yes [provider]  ?folic acid (FOLVITE) 1 MG tablet Take 1 mg by mouth daily.   Yes [provider]  ?gabapentin (NEURONTIN) 300 MG capsule Take 900 mg by mouth at bedtime.   Yes [provider]  ?GARLIC OIL PO Take 1 capsule by mouth daily.   Yes [provider]  ?guaiFENesin (MUCINEX) 600 MG 12 hr tablet Take 1 tablet (600 mg total) by mouth 2 (two) times daily. ?Patient taking differently: Take 600 mg by mouth 2 (two) times daily as needed for cough. 05/21/21  Yes Farrel Gordon, DO  ?lisinopril (ZESTRIL) 20 MG tablet Take 20 mg by mouth daily.   Yes [provider]  ?lurasidone (LATUDA) 40 MG TABS tablet Take 40 mg by mouth daily after supper.   Yes [provider]  ?Melatonin 3 MG TABS Take 6 mg by mouth at bedtime.   Yes [provider]  ?metFORMIN (GLUCOPHAGE) 500 MG tablet Take 500 mg by mouth 2 (two) times daily with a meal.   Yes  [provider]  ?methotrexate (RHEUMATREX) 2.5 MG tablet Take 15 mg by mouth once a week.   Yes [provider]  ?Mometasone Furoate 200 MCG/ACT AERO Inhale 2 puffs into the lungs at bedtime.   Yes [provider]  ?mycophenolate (CELLCEPT) 250 MG capsule Take 1,500 mg by mouth 2 (two) times daily.   Yes [provider]  ?Olodaterol HCl (STRIVERDI RESPIMAT) 2.5 MCG/ACT AERS Inhale 2 puffs into the lungs daily.   Yes [provider]  ?OVER THE COUNTER MEDICATION Take 1 tablet by mouth at bedtime. Pure ZZZs sleep and destress   Yes [provider]  ?oxcarbazepine (TRILEPTAL) 600 MG tablet Take 600-900 mg by mouth See admin instructions. Take 600mg  twice daily and 900mg  at bedtime 07/16/20  Yes [provider]  ?Polyethyl Glycol-Propyl Glycol (SYSTANE OP) Place 1 drop into both eyes daily as needed (dry eye).   Yes [provider]  ?polyethylene glycol (MIRALAX / GLYCOLAX) 17 g packet Take 17 g by mouth daily.   Yes [provider]   ?sodium chloride (OCEAN) 0.65 % SOLN nasal spray Place 2 sprays into both nostrils 4 (four) times daily as needed for congestion.   Yes [provider]  ?tamsulosin (FLOMAX) 0.4 MG CAPS capsu

## 2021-07-01 NOTE — Progress Notes (Signed)
Chaplain did try to engage in initial visit with Mario [name] earlier today but he was using the bathroom. ? ?Chaplain will follow-up later today for support.  ? ? ? 07/01/21 1200  ?Clinical Encounter Type  ?Visited With Patient not available  ?Visit Type Initial  ? ? ?

## 2021-07-01 NOTE — Progress Notes (Signed)
Physical Therapy Treatment ?Patient Details ?Name: Mario [name] ?MRN: [id number] ?DOB: [birth year] ?Today's Date: 07/01/2021 ? ? ?History of Present Illness Pt is 71 yo male admitted on 06/16/21 with acute on chronic resp failure - COPD, CAP, sepsis and hx of [place] lung CA stage II s/p radiation. Pt also with new c/o leg paresthesia.  Pt's 4th admission for similar dx in past 6 weeks.  Pt with hx including PE in 2023, OSA on CPAP, HTN, chronic back pain, hx of ankylosing spondylitis, anxiety, and depression, T2DM. ? ?  ?PT Comments  ? ? Pt demonstrates a limited effort today, having been up on [facility] for a lengthy time and then declining to walk much other than return to bed.  Pt is reporting numbness from the pressure of seat on LE's, and in bed did work on RROM to resolve the sensory changes from pressure on back of legs.  Follow up with him for better performance of mobility and will encourage OOB in chair to reduce time needed in SNF following hospital care.  SNF recommended due to limits of endurance to stand, general weakness and poor control of posture in standing to allow a return home.   ?Recommendations for follow up therapy are one component of a multi-disciplinary discharge planning process, led by the attending physician.  Recommendations may be updated based on patient status, additional functional criteria and insurance authorization. ? ?Follow Up Recommendations ? Skilled nursing-short term rehab (<3 hours/day) ?  ?  ?Assistance Recommended at Discharge PRN  ?Patient can return home with the following A little help with bathing/dressing/bathroom;Help with stairs or ramp for entrance;Assistance with cooking/housework;Assist for transportation ?  ?Equipment Recommendations ? None recommended by PT  ?  ?Recommendations for Other Services   ? ? ?  ?Precautions / Restrictions Precautions ?Precautions: Fall ?Precaution Comments: O2 at home ?Restrictions ?Weight Bearing Restrictions: No  ?  ? ?Mobility ? Bed  Mobility ?Overal bed mobility: Modified Independent ?Bed Mobility: Sit to Supine ?  ?  ?  ?Sit to supine: Modified independent (Device/Increase time) ?  ?General bed mobility comments: pt is motivated to get legs onto bed ?  ? ?Transfers ?Overall transfer level: Needs assistance ?Equipment used: Rolling walker (2 wheels) ?Transfers: Sit to/from Stand ?Sit to Stand: Modified independent (Device/Increase time) ?  ?  ?  ?  ?  ?  ?  ? ?Ambulation/Gait ?  ?  ?  ?  ?  ?  ?  ?General Gait Details: declined ? ? ?Stairs ?  ?  ?  ?  ?  ? ? ?Wheelchair Mobility ?  ? ?Modified Rankin (Stroke Patients Only) ?  ? ? ?  ?Balance Overall balance assessment: Needs assistance ?Sitting-balance support: No upper extremity supported ?Sitting balance-Leahy Scale: Good ?  ?  ?  ?  ?  ?  ?  ?  ?  ?  ?  ?  ?  ?  ?  ?  ?  ? ?  ?Cognition Arousal/Alertness: Awake/alert ?Behavior During Therapy: [facility] for tasks assessed/performed ?Overall Cognitive Status: Within Functional Limits for tasks assessed ?  ?  ?  ?  ?  ?  ?  ?  ?  ?  ?  ?  ?  ?  ?  ?  ?  ?  ?  ? ?  ?Exercises General Exercises - Lower Extremity ?Ankle Circles/Pumps: AAROM, 5 reps ?Quad Sets: AROM, 10 reps ?Gluteal Sets: AROM, 10 reps ?Long Arc Quad: AROM, 10 reps ?Heel Slides:  AROM, 10 reps ? ?  ?General Comments General comments (skin integrity, edema, etc.): pt was assisted to get cleaned up from [facility] and then declined further standing work ?  ?  ? ?Pertinent Vitals/Pain Pain Assessment ?Pain Assessment: Faces ?Faces Pain Scale: Hurts little more ?Pain Location: chronic back pain ?Pain Descriptors / Indicators: Discomfort, Guarding  ? ? ?Home Living   ?  ?  ?  ?  ?  ?  ?  ?  ?  ?   ?  ?Prior Function    ?  ?  ?   ? ?PT Goals (current goals can now be found in the care plan section) Acute Rehab PT Goals ?Patient Stated Goal: for his breathing to improve ?Progress towards PT goals: Not progressing toward goals - comment ? ?  ?Frequency ? ? ? Min 3X/week ? ? ? ?  ?PT Plan Current plan  remains appropriate  ? ? ?Co-evaluation   ?  ?  ?  ?  ? ?  ?AM-PAC PT "6 Clicks" Mobility   ?Outcome Measure ? Help needed turning from your back to your side while in a flat bed without using bedrails?: None ?Help needed moving from lying on your back to sitting on the side of a flat bed without using bedrails?: A Little ?Help needed moving to and from a bed to a chair (including a wheelchair)?: A Little ?Help needed standing up from a chair using your arms (e.g., wheelchair or bedside chair)?: A Little ?Help needed to walk in hospital room?: A Little ?Help needed climbing 3-5 steps with a railing? : A Lot ?6 Click Score: 18 ? ?  ?End of Session Equipment Utilized During Treatment: Oxygen ?Activity Tolerance: Patient tolerated treatment well ?Patient left: with bed alarm set ?Nurse Communication: Mobility status ?PT Visit Diagnosis: Other abnormalities of gait and mobility (R26.89) ?  ? ? ?Time: 6387-5643 ?PT Time Calculation (min) (ACUTE ONLY): 14 min ? ?Charges:  $Therapeutic Exercise: 8-22 mins ?[name] ?07/01/2021, 7:02 PM ? ?Mee Hives, PT PhD ?Acute Rehab Dept. Number: [facility] [phone] and [place] [phone] ? ? ?

## 2021-07-01 NOTE — Consult Note (Signed)
? ?                                                                                ?Consultation Note ?Date: 07/01/2021  ? ?Patient Name: Mario [name]  ?DOB: Apr 02, 1950  MRN: [id number]  Age / Sex: 71 y.o., male  ?PCP: [facility] ?Referring Physician: Terrilee Croak, MD ? ?Reason for Consultation: Establishing goals of care ? ?HPI/Patient Profile: 71 y.o. male  with past medical history of NSCLC s/p SBRT, vasculitis, ankylosing spondylitis, COPD, OSA, T2DM, HTN  admitted on 06/16/2021 with recurrent PNA.  He has had multiple admissions since 3/22:   ?3/22-3/24, patient was admitted with hypoxia.  CT chest at that time showed a left sided consolidation suspicious for infection.  It was treated with Augmentin/azithromycin, steroids.  He was discharged on 4 L by nasal cannula. ?Readmitted again 3/24 to 3/27 -completed course of antibiotics. ?4/6-4/13, readmitted for right lower lobe pneumonia and left lower pleural effusion.  Completed a course of IV cefepime. ?4/19-presented again with left lung pneumonia. ? ?Workup this hospitalization included thoracentesis that revealed malignant cells. ? ?Palliative consulted for [facility]. ? ?Clinical Assessment and Goals of Care: ?I met today with Mario [name]. ? ?I introduced palliative care as specialized medical care for people living with serious illness. It focuses on providing relief from the symptoms and stress of a serious illness. The goal is to improve quality of life for both the patient and the family. ? ?When asked about how he was doing, he stated "it's all crap. I feel like crap, my situation is crap, my body is crap." ? ?Discussed his clinical course as well as wishes moving forward in regard to advanced directives.  Concepts specific to code status, surrogate decision making, and rehospitalization discussed.  We discussed difference between a aggressive medical intervention path and a palliative, comfort focused care path.  Values and goals of care  important to patient and family were attempted to be elicited. ?  ?Questions and concerns addressed.   PMT will continue to support holistically. ?  ?SUMMARY OF RECOMMENDATIONS   ?-Currently full code/full scope.  He reports that he does not think that he would want overly aggressive interventions but wants to consider this prior to making any changes to his care plan.  We discussed MOST form as a way to outline his care wishes moving forward, particularly in light of the fact that he is going to be going to facility at time of discharge and because he is not in close contact with his family.  He would like to review this prior to completion and will let me know if he wants to complete prior to discharge.  Otherwise, I recommend he be followed by outpatient palliative care to complete MOST form and continue goals of care conversations once he is able to see his oncologist to further discuss new discovery of recurrence of lung cancer. ?-Discussed with him today and, legally, his daughter would be his surrogate decision maker in the event he cannot make his own decisions.  He reports it has been "quite a while" since he talked with her and he is not sure if number on file is  up-to-date. ?-He feels that he needs the opportunity to discuss with Dr. Earlie Server prior to making any long-term plans for goals of care.  He was found on this admission to have malignant cells in pleural fluid but has not yet been able to discuss further with oncology. ?-He is agreeable to skilled facility at time of discharge for rehab.  Recommend outpatient palliative care to follow. ?Code Status/Advance Care Planning: ?Full code ? ?  ? ?Primary Diagnoses: ?Present on Admission: ? HCAP (healthcare-associated pneumonia) ? OSA (obstructive sleep apnea) ? Non-small cell cancer of left lung ([place]) ? Acute on chronic respiratory failure ([place]) ? History of pulmonary embolism ? Pneumonia ? Hyperkalemia ? Hyponatremia ? ? ?I have reviewed the medical  record, interviewed the patient and family, and examined the patient. The following aspects are pertinent. ? ?Past Medical History:  ?Diagnosis Date  ? Anxiety   ? Bronchitis   ? COPD (chronic obstructive pulmonary disease) ([place])   ? Depression   ? History of radiation therapy   ? Left lung- 10/05/20-10/15/20- Dr. Gery Pray  ? Hypertension   ? lung ca 09/2020  ? Mental disorder   ? MI (myocardial infarction) ([place])   ? ????  ? OSA (obstructive sleep apnea)   ? Suicide attempt [facility])   ? Tension pneumothorax 06/27/2016  ? Uveitis   ? ?Social History  ? ?Socioeconomic History  ? Marital status: Single  ?  Spouse name: Not on file  ? Number of children: Not on file  ? Years of education: Not on file  ? Highest education level: Not on file  ?Occupational History  ? Occupation: retired  ?Tobacco Use  ? Smoking status: Former  ?  Packs/day: 1.00  ?  Years: 35.00  ?  Pack years: 35.00  ?  Types: Cigarettes  ?  Quit date: 05/2016  ?  Years since quitting: 5.0  ? Smokeless tobacco: Never  ?Vaping Use  ? Vaping Use: Never used  ?Substance and Sexual Activity  ? Alcohol use: No  ?  Alcohol/week: 0.0 standard drinks  ?  Comment: denies use of any drugs or alcohol  ? Drug use: No  ? Sexual activity: Not on file  ?Other Topics Concern  ? Not on file  ?Social History Narrative  ? Not on file  ? ?Social Determinants of Health  ? ?Financial Resource Strain: Low Risk   ? Difficulty of Paying Living Expenses: Not hard at all  ?Food Insecurity: Not on file  ?Transportation Needs: No Transportation Needs  ? Lack of Transportation (Medical): No  ? Lack of Transportation (Non-Medical): No  ?Physical Activity: Not on file  ?Stress: No Stress Concern Present  ? Feeling of Stress : Not at all  ?Social Connections: Not on file  ? ?Family History  ?Problem Relation Age of Onset  ? Dementia Father   ? ?Scheduled Meds: ? arformoterol  15 mcg Nebulization BID  ? aspirin EC  81 mg Oral Daily  ? buPROPion  300 mg Oral Daily  ? busPIRone  15 mg  Oral Daily  ? Chlorhexidine Gluconate Cloth  6 each Topical Daily  ? diltiazem  180 mg Oral Daily  ? DULoxetine  60 mg Oral BID  ? feeding supplement (GLUCERNA SHAKE)  237 mL Oral TID BM  ? fluticasone  2 puff Inhalation BID  ? folic acid  1 mg Oral Daily  ? furosemide  40 mg Intravenous Daily  ? gabapentin  300 mg Oral BID  ? gabapentin  900 mg Oral QHS  ? insulin aspart  0-9 Units Subcutaneous TID WC  ? lidocaine  1 patch Transdermal Q24H  ? lurasidone  40 mg Oral QPC supper  ? melatonin  6 mg Oral QHS  ? methotrexate  15 mg Oral Weekly  ? mycophenolate  1,500 mg Oral BID  ? OXcarbazepine  600 mg Oral BID  ? oxcarbazepine  900 mg Oral QHS  ? polyethylene glycol  17 g Oral BID  ? prednisoLONE acetate  1 drop Left Eye Q2H while awake  ? senna-docusate  1 tablet Oral BID  ? sodium chloride flush  10-40 mL Intracatheter Q12H  ? tamsulosin  0.4 mg Oral Daily  ? traMADol  100 mg Oral QHS  ? traZODone  75 mg Oral QHS  ? ?Continuous Infusions: ? methocarbamol (ROBAXIN) IV Stopped (06/23/21 4097)  ? ?PRN Meds:.acetaminophen, albuterol, clobetasol ointment, lidocaine, lip balm, methocarbamol (ROBAXIN) IV, ondansetron (ZOFRAN) IV, oxyCODONE-acetaminophen, sodium chloride, sodium chloride flush ?Medications Prior to Admission:  ?Prior to Admission medications   ?Medication Sig Start Date End Date Taking? Authorizing Provider  ?albuterol (PROVENTIL) (2.5 MG/3ML) 0.083% nebulizer solution Inhale 3 mLs (2.5 mg total) by nebulization every 4 (four) hours as needed for wheezing or shortness of breath. ?Patient taking differently: Take 2.5 mg by nebulization every 6 (six) hours as needed for wheezing or shortness of breath. 08/04/20 08/04/21 Yes Lavina Hamman, MD  ?albuterol (VENTOLIN HFA) 108 (90 Base) MCG/ACT inhaler Inhale 2 puffs into the lungs every 6 (six) hours as needed for wheezing. 06/02/20  Yes [provider]  ?apixaban (ELIQUIS) 5 MG TABS tablet Take 5 mg by mouth 2 (two) times daily.   Yes [provider]  ?aspirin 81 MG EC tablet Take 81 mg by mouth daily.   Yes [provider]  ?b complex vitamins capsule Take 1 capsule by mouth daily.   Yes [provider]  ?buPROPion (WELLBUTRIN XL) 300 MG 2

## 2021-07-01 NOTE — TOC Progression Note (Signed)
Transition of Care (TOC) - Progression Note  ? ? ?Patient Details  ?Name: Mario [name] ?MRN: [id number] ?Date of Birth: 1950/03/03 ? ?Transition of Care (TOC) CM/SW Contact  ?Tawanna Cooler, RN ?Phone Number: ?07/01/2021, 4:05 PM ? ?Clinical Narrative:    ? ?Checked with [place] for SNF is still pending.   ? ? ?

## 2021-07-01 NOTE — Progress Notes (Signed)
?PROGRESS NOTE ? ?Mario [name]  ?DOB: April 12, 1950  ?PCP: [facility] ?XNA:[id number]  ?DOA: 06/16/2021 ? LOS: 14 days  ?Hospital Day: 16 ? ?Brief narrative: ?Mario [name] is a 71 y.o. 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   ?Patient presented to the ED on 06/16/2021 with concerns of hypoxia, nonproductive cough, bilateral leg weakness.   ? ?In the ED, he was tachycardic.  Chest x-ray showed patchy infiltrate in the left lower lung. ?Patient apparently had multiple recent admissions for recurrent pneumonia.  Pulmonary consultation was called because of nonresolving pneumonia. ? ?Recent hospitalizations: ?3/22-3/24, patient was admitted with hypoxia.  CT chest at that time showed a left sided consolidation suspicious for infection.  It was treated with Augmentin/azithromycin, steroids.  He was discharged on 4 L by nasal cannula. ?Readmitted again 3/24 to 3/27 -completed course of antibiotics. ?4/6-4/13, readmitted for right lower lobe pneumonia and left lower pleural effusion.  Completed a course of IV cefepime. ?4/19-presented again with left lung pneumonia. ? ?Subjective: ?Patient was seen and examined this morning.   ?Elderly Caucasian male   Sitting up in bed.  Not in distress. ?Reports he gets short of breath on screening up to the bathroom ?Repeat chest x-ray yesterday did not show big sized effusion. ?Hemoglobin 8.1 this morning.  FOBT positive stool. ?GI consult ? ?Assessment and Plan: ?Acute anemia ?-FOBT positive.  Hemoglobin dropped to the lowest of 6.6.  Given 2 units of PRBCs, 8.1 today.  GI consulted today.   ?Recent Labs  ?  06/17/21 ?5427 06/17/21 ?0623 06/18/21 ?7628 06/24/21 ?3151 06/29/21 ?7616 06/30/21 ?0737 06/30/21 ?1130 07/01/21 ?0354  ?HGB 13.6  --    < > 9.5*  6.6* 6.9* 8.4* 8.1*  ?MCV 97.4  --    < > 97.9 101.0* 99.5  --  97.3  ?VITAMINB12  --  1,064*  --   --   --   --   --   --   ?FOLATE 10.7  --   --   --   --   --   --   --   ? < > = values in this interval not displayed.  ? ?Sepsis secondary to recurrent pneumonia ?-Patient with tachycardia, leukocytosis, chest x-ray finding of pneumonia. ?-Blood culture and pleural fluid culture did not show any growth. ?-Completed 7-day course of broad-spectrum antibiotics. ? ?Persistent left lower lobe infiltrate ?Adenocarcinoma of left lung s/p SRBT ?-Completed SBRT to the left lower lobe lung mass under the care of Dr. Sondra Come completed on 10/15/2020. Currently on observation. ?-With the new findings of malignant cells seen pleural fluid, patient to follow up with Dr. Myles Gip in next 1 to 2 weeks. ?-Patient has poor functional ability.  Gets short of breath easily.   ?-Repeat chest x-ray on 5/3 showed a patchy infiltrate in the left lower lung field with slight improvement, findings suggestive of pneumonia, also has small to moderate left pleural effusion. ?-Palliative care consult appreciated. ? ?Acute on chronic respiratory failure  ?-Currently back to baseline, on 3 L/min. ? ?History of mild systolic CHF ?Essential hypertension ?-Looks volume overloaded.  On Lasix IV 40 mg daily for last 3 days.  echocardiogram on 5/2 with EF 60 to 65%. ?-Continue to monitor hemodynamics and renal function. ?-Home meds include Cardizem 180  mg daily, lisinopril 20 mg daily ?-Continue both. ? ?Type 2 diabetes mellitus ?-A1c 7.6 in March 2023 ?-Home meds include metformin ?-Currently metformin is on hold.  Continue sliding scale insulin with Accu-Cheks. ?Recent Labs  ?Lab 06/30/21 ?0803 06/30/21 ?1714 06/30/21 ?2217 07/01/21 ?7829 07/01/21 ?1204  ?GLUCAP 172* 185* 150* 226* 143*  ? ?History of pulmonary embolism ?-Patient apparently had pulm embolism in December 2022.  He has been on Eliquis since then.  Because of drop in hemoglobin requiring  transfusion, Eliquis was held.  Last dose on the evening of 5/2.   ?  ?Chronic hyponatremia ?-appears has been having hyponatremia since 2022 ?-Currently on oral Lasix for hypervolemia.  Sodium level seems low but stable. ?Recent Labs  ?Lab 06/25/21 ?5621 06/26/21 ?0503 06/27/21 ?3086 06/29/21 ?5784 06/30/21 ?6962 07/01/21 ?0354  ?NA 128* 128* 130* 129* 130* 132*  ? ?Leg paresthesia ?-Reports acute bilateral feet paresthesia for past 3 days prior to admit. Strong dorsalis pedis pulses heard on bedside doppler.  ?-Numbness noted primarily at soles of both feet.  ?-currently remains on Neurontin 300 mg twice daily as well as 900 mg at bedtime. ?  ?history of vasculitis and ankylosing spondylitis  ?-on methotrexate and mycophenolate ?  ?OSA (obstructive sleep apnea) ?-Declined hospital CPAP ? ?Morbid obesity ?-BMI 43 ?-Recommend diet/lifestyle modification ?  ?Constipation ?-Continue stool softeners and as needed laxatives ?  ?Sacral pain /right hip pain  ?-after fall on 4/27, repeat cxr no acute findings, consider CT pelvis if pain persists ?  ?Goals of care ?  Code Status: Full Code  ? ? ?Mobility: Limited because of shortness of breath ? ?Skin assessment:  ?  ? ?Nutritional status:  ?Body mass index is 42.15 kg/m?.  ?  ?  ? ? ? ? ?Diet:  ?Diet Order   ? ?       ?  Diet Carb Modified Fluid consistency: Thin; Room service appropriate? Yes; Fluid restriction: 1200 mL Fluid  Diet effective now       ?  ? ?  ?  ? ?  ? ? ?DVT prophylaxis:  ?Place and maintain sequential compression device Start: 06/30/21 0828 ?  ?Antimicrobials: None currently ?Fluid: None ?Consultants: Pulmonology, oncology, palliative care, GI ?Family Communication: None at bedside ? ?Status is: Inpatient ? ?Continue in-hospital care because: Continues to be short of breath on minimal exertion, hemoglobin dropping, GI to see ?Level of care: Telemetry  ? ?Dispo: The patient is from: Home ?             Anticipated d/c is to: SNF likely ?              Patient currently is not medically stable to d/c. ?  Difficult to place patient No ? ? ? ? ?Infusions:  ? methocarbamol (ROBAXIN) IV Stopped (06/23/21 9528)  ? ? ?Scheduled Meds: ? arformoterol  15 mcg Nebulization BID  ? aspirin EC  81 mg Oral Daily  ? buPROPion  300 mg Oral Daily  ? busPIRone  15 mg Oral Daily  ? Chlorhexidine Gluconate Cloth  6 each Topical Daily  ? diltiazem  180 mg Oral Daily  ? DULoxetine  60 mg Oral BID  ? feeding supplement (GLUCERNA SHAKE)  237 mL Oral TID BM  ? fluticasone  2 puff Inhalation BID  ? folic acid  1 mg Oral Daily  ? furosemide  40 mg Intravenous Daily  ? gabapentin  300 mg Oral BID  ? gabapentin  900 mg Oral QHS  ?  insulin aspart  0-9 Units Subcutaneous TID WC  ? lidocaine  1 patch Transdermal Q24H  ? lurasidone  40 mg Oral QPC supper  ? melatonin  6 mg Oral QHS  ? methotrexate  15 mg Oral Weekly  ? mycophenolate  1,500 mg Oral BID  ? OXcarbazepine  600 mg Oral BID  ? oxcarbazepine  900 mg Oral QHS  ? polyethylene glycol  17 g Oral BID  ? prednisoLONE acetate  1 drop Left Eye Q2H while awake  ? senna-docusate  1 tablet Oral BID  ? sodium chloride flush  10-40 mL Intracatheter Q12H  ? tamsulosin  0.4 mg Oral Daily  ? traMADol  100 mg Oral QHS  ? traZODone  75 mg Oral QHS  ? ? ?PRN meds: ?acetaminophen, albuterol, clobetasol ointment, lidocaine, lip balm, methocarbamol (ROBAXIN) IV, ondansetron (ZOFRAN) IV, oxyCODONE-acetaminophen, sodium chloride, sodium chloride flush  ? ?Antimicrobials: ?Anti-infectives (From admission, onward)  ? ? Start     Dose/Rate Route Frequency Ordered Stop  ? 06/17/21 0600  ceFEPIme (MAXIPIME) 2 g in sodium chloride 0.9 % 100 mL IVPB       ? 2 g ?200 mL/hr over 30 Minutes Intravenous Every 8 hours 06/17/21 0103 06/24/21 0801  ? 06/16/21 2115  vancomycin (VANCOCIN) IVPB 1000 mg/200 mL premix       ? 1,000 mg ?200 mL/hr over 60 Minutes Intravenous  Once 06/16/21 2101 06/16/21 2259  ? 06/16/21 2115  ceFEPIme (MAXIPIME) 2 g in sodium chloride 0.9 % 100 mL  IVPB       ? 2 g ?200 mL/hr over 30 Minutes Intravenous  Once 06/16/21 2107 06/16/21 2146  ? ?  ? ? ?Objective: ?Vitals:  ? 07/01/21 0809 07/01/21 0848  ?BP:  135/63  ?Pulse:    ?Resp:    ?Temp:    ?SpO2: 9

## 2021-07-01 NOTE — Progress Notes (Signed)
Per Dr. Julien Nordmann, he would like to see the patient sooner than June. Patient is re-scheduled to see Dr.  Julien Nordmann next week now.  ?

## 2021-07-02 DIAGNOSIS — J189 Pneumonia, unspecified organism: Secondary | ICD-10-CM | POA: Diagnosis not present

## 2021-07-02 LAB — BASIC METABOLIC PANEL
Anion gap: 8 (ref 5–15)
BUN: 19 mg/dL (ref 8–23)
CO2: 29 mmol/L (ref 22–32)
Calcium: 8.1 mg/dL — ABNORMAL LOW (ref 8.9–10.3)
Chloride: 94 mmol/L — ABNORMAL LOW (ref 98–111)
Creatinine, Ser: 0.8 mg/dL (ref 0.61–1.24)
GFR, Estimated: >60 mL/min (ref >60)
Glucose, Bld: 186 mg/dL — ABNORMAL HIGH (ref 70–99)
Potassium: 4 mmol/L (ref 3.5–5.1)
Sodium: 131 mmol/L — ABNORMAL LOW (ref 135–145)

## 2021-07-02 LAB — CBC WITH DIFFERENTIAL/PLATELET
Abs Immature Granulocytes: 0.06 10*3/uL (ref 0.00–0.07)
Basophils Absolute: 0 10*3/uL (ref 0.0–0.1)
Basophils Relative: 1 %
Eosinophils Absolute: 0.3 10*3/uL (ref 0.0–0.5)
Eosinophils Relative: 4 %
HCT: 23.3 % — ABNORMAL LOW (ref 39.0–52.0)
Hemoglobin: 7.7 g/dL — ABNORMAL LOW (ref 13.0–17.0)
Immature Granulocytes: 1 %
Lymphocytes Relative: 20 %
Lymphs Abs: 1.5 10*3/uL (ref 0.7–4.0)
MCH: 32.4 pg (ref 26.0–34.0)
MCHC: 33 g/dL (ref 30.0–36.0)
MCV: 97.9 fL (ref 80.0–100.0)
Monocytes Absolute: 1.1 10*3/uL — ABNORMAL HIGH (ref 0.1–1.0)
Monocytes Relative: 14 %
Neutro Abs: 4.8 10*3/uL (ref 1.7–7.7)
Neutrophils Relative %: 60 %
Platelets: 430 10*3/uL — ABNORMAL HIGH (ref 150–400)
RBC: 2.38 MIL/uL — ABNORMAL LOW (ref 4.22–5.81)
RDW: 18.1 % — ABNORMAL HIGH (ref 11.5–15.5)
WBC: 7.8 10*3/uL (ref 4.0–10.5)
nRBC: 0.4 % — ABNORMAL HIGH (ref 0.0–0.2)

## 2021-07-02 LAB — GLUCOSE, CAPILLARY
Glucose-Capillary: 151 mg/dL — ABNORMAL HIGH (ref 70–99)
Glucose-Capillary: 148 mg/dL — ABNORMAL HIGH (ref 70–99)
Glucose-Capillary: 152 mg/dL — ABNORMAL HIGH (ref 70–99)
Glucose-Capillary: 171 mg/dL — ABNORMAL HIGH (ref 70–99)

## 2021-07-02 NOTE — Progress Notes (Signed)
Methotrexate (Trexall; Rheumatrex) hold criteria ?Hgb < 8 ?WBC < 3 ?Pltc < 100K ?SCr > 1.5x baseline (or > 2 if baseline unknown) ?AST or ALT >3x ULN ?Bili > 1.5x ULN ?Ascites or pleural effusion ?Diarrhea - Grade 2 or higher ?Ulcerative stomatitis ?Unexplained pneumonitis / hypoxemia ?Active infection  ? ? ? ?Methotrexate 15 mg qweek DC'd for Hg 7.7 ? ? ?Eudelia Bunch, Pharm.D ?07/02/2021 8:03 AM ? ?

## 2021-07-02 NOTE — Progress Notes (Addendum)
[facility] Progress Note ? ?RYLEI CODISPOTI 71 y.o. 29-Sep-1950 ? ?CC: Anemia, positive FOBT ? ? ?Subjective: ?Patient seen and examined laying in bed.  He denies abdominal pain, melena, hematochezia.  Patient was tolerating clears well.  ? ?ROS : Review of Systems  ?Gastrointestinal:  Negative for abdominal pain, blood in stool, constipation, diarrhea, heartburn, melena, nausea and vomiting.  ?Genitourinary:  Negative for dysuria and urgency.   ? ? ?Objective: ?Vital signs in last 24 hours: ?Vitals:  ? 07/02/21 0514 07/02/21 0801  ?BP: 130/75   ?Pulse: 87   ?Resp:    ?Temp: (!) 97.5 ?F (36.4 ?C)   ?SpO2: 100% 99%  ? ? ?Physical Exam: ? ?General:  Alert, cooperative, no distress, appears stated age, pale, obese  ?Head:  Normocephalic, without obvious abnormality, atraumatic  ?Eyes:  Anicteric sclera, EOM's intact  ?Lungs:   Clear to auscultation bilaterally, respirations unlabored  ?Heart:  Regular rate and rhythm, S1, S2 normal  ?Abdomen:   Soft, non-tender, bowel sounds active all four quadrants,  no masses,   ?Extremities: Extremities normal, atraumatic, no  edema  ?Pulses: 2+ and symmetric  ? ? ?Lab Results: ?Recent Labs  ?  07/01/21 ?0354 07/02/21 ?0500  ?NA 132* 131*  ?K 4.0 4.0  ?CL 93* 94*  ?CO2 30 29  ?GLUCOSE 179* 186*  ?BUN 21 19  ?CREATININE 0.74 0.80  ?CALCIUM 8.5* 8.1*  ? ?No results for input(s): AST, ALT, ALKPHOS, BILITOT, PROT, ALBUMIN in the last 72 hours. ?Recent Labs  ?  07/01/21 ?0354 07/02/21 ?0500  ?WBC 9.3 7.8  ?NEUTROABS 6.2 4.8  ?HGB 8.1* 7.7*  ?HCT 25.2* 23.3*  ?MCV 97.3 97.9  ?PLT 434* 430*  ? ?No results for input(s): LABPROT, INR in the last 72 hours. ? ? ? ?Assessment ?Severe anemia ?Positive FOBT ?  ?2 days ago hemoglobin 6.6.  After 2 units of packed red blood cells now 7.7 today.  10 days ago hemoglobin was 11.8. ?  ?Patient with new onset anemia and during this hospitalization.  No melena, hematochezia, hematemesis.  Patient with positive FOBT.  Possible GI bleed.  ?   ?Iron 36, TIBC 415, saturation ration 9%, ferritin 123 ?  ?Patient may benefit from EGD for further evaluation of anemia.   ?  ?Plan: ?Plan for EGD tomorrow. I thoroughly discussed the procedures to include nature, alternatives, benefits, and risks including but not limited to bleeding, perforation, infection, anesthesia/cardiac and pulmonary complications. Patient provides understanding and gave verbal consent to proceed. ?Continue Protonix IV 40mg  daily ?Continue clear liquid diet ?NPO at midnight ?Continue anti-emetics and supportive care as needed. ?[facility] will follow.   ? ? ?Charlott Rakes PA-C ?07/02/2021, 9:42 AM ? ?Contact #  [phone] ? ?

## 2021-07-02 NOTE — TOC Progression Note (Signed)
Transition of Care (TOC) - Progression Note  ? ? ?Patient Details  ?Name: Mario [name] ?MRN: [id number] ?Date of Birth: 06-Jun-1950 ? ?Transition of Care (TOC) CM/SW Contact  ?Ross Ludwig, LCSW ?Phone Number: ?07/02/2021, 10:31 AM ? ?Clinical Narrative:    ? ?[facility] requesting updated clinicals.  Send updated clinicals to patient's insurance company. ? ?Expected Discharge Plan: [place] ?Barriers to Discharge: Continued Medical Work up ? ?Expected Discharge Plan and Services ?Expected Discharge Plan: [place] ?  ?  ?Post Acute Care Choice: Home Health ?Living arrangements for the past 2 months: [place] ?                ?  ?  ?  ?  ?  ?HH Arranged: PT ?[place] Agency: [place] ?Date HH Agency Contacted: 06/20/21 ?Time [place]: 3212 ?Representative spoke with at [place]: [name] ? ? ?Social Determinants of Health (SDOH) Interventions ?  ? ?Readmission Risk Interventions ? ?  06/07/2021  ?  4:15 PM  ?Readmission Risk Prevention Plan  ?Transportation Screening Complete  ?Medication Review Press photographer) Complete  ?PCP or Specialist appointment within 3-5 days of discharge Complete  ?[place] or Home Care Consult Complete  ?SW Recovery Care/Counseling Consult Complete  ?Palliative Care Screening Not Applicable  ?[place] Not Applicable  ? ? ?

## 2021-07-02 NOTE — Anesthesia Preprocedure Evaluation (Addendum)
Anesthesia Evaluation  ?Patient identified by MRN, date of birth, ID band ?Patient awake ? ? ? ?Reviewed: ?Allergy & Precautions, NPO status , Patient's Chart, lab work & pertinent test results ? ?History of Anesthesia Complications ?Negative for: history of anesthetic complications ? ?Airway ?Mallampati: II ? ?TM Distance: >3 FB ?Neck ROM: Full ? ? ? Dental ? ?(+) Poor Dentition, Missing ?  ?Pulmonary ?sleep apnea , COPD, former smoker,  ?  ?Pulmonary exam normal ? ? ? ? ? ? ? Cardiovascular ?hypertension, Pt. on medications ?+ Past MI  ?Normal cardiovascular exam ? ? ?  ?Neuro/Psych ?Anxiety Depression negative neurological ROS ?   ? GI/Hepatic ?Neg liver ROS, GIB ?  ?Endo/Other  ?diabetes, Type 2, Oral Hypoglycemic Agents ? Renal/GU ?Renal InsufficiencyRenal disease  ?negative genitourinary ?  ?Musculoskeletal ?negative musculoskeletal ROS ?(+)  ? Abdominal ?  ?Peds ? Hematology ? ?(+) Blood dyscrasia (Hgb 7.7), anemia ,   ?Anesthesia Other Findings ?Day of surgery medications reviewed with patient. ? Reproductive/Obstetrics ?negative OB ROS ? ?  ? ? ? ? ? ? ? ? ? ? ? ? ? ?  ?  ? ? ? ? ? ? ? ?Anesthesia Physical ?Anesthesia Plan ? ?ASA: 3 ? ?Anesthesia Plan: MAC  ? ?Post-op Pain Management: Minimal or no pain anticipated  ? ?Induction:  ? ?PONV Risk Score and Plan: Treatment may vary due to age or medical condition and Propofol infusion ? ?Airway Management Planned: Natural Airway and Nasal Cannula ? ?Additional Equipment: None ? ?Intra-op Plan:  ? ?Post-operative Plan:  ? ?Informed Consent:  ? ?Plan Discussed with:  ? ?Anesthesia Plan Comments:   ? ? ? ? ? ? ?Anesthesia Quick Evaluation ? ?

## 2021-07-02 NOTE — Progress Notes (Signed)
Pt refused cpap

## 2021-07-02 NOTE — Progress Notes (Signed)
?PROGRESS NOTE ? ?Mario [name]  ?DOB: 1950/12/24  ?PCP: [facility] ?WIO:[id number]  ?DOA: 06/16/2021 ? LOS: 15 days  ?Hospital Day: 17 ? ?Brief narrative: ? ?50 WM lives athome with tenant ? 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   ? ?In the ED, tachycardic.  Chest x-ray - patchy infiltrate in the left lower lung. ?S/p multiple recent admissions for recurrent pneumonia.  Pulmonary consultation was called because of nonresolving pneumonia. ? ?Recent hospitalizations: ?3/22-3/24, patient was admitted with hypoxia.  CT chest at that time showed a left sided consolidation suspicious for infection.  It was treated with Augmentin/azithromycin, steroids.  He was discharged on 4 L by nasal cannula. ?Readmitted  3/24 to 3/27 -completed course of antibiotics. ?4/6-4/13, readmitted for right lower lobe pneumonia and left lower pleural effusion.  Completed a course of IV cefepime. ?4/19-presented again with left lung pneumonia. ? ?Subjective: ? ?Fair ?No cp ?Sleepy--states he isn't using the CPAP as the mask hurts--willing to trial for some time tonight ? ?Assessment and Plan: ?Acute anemia ?-FOBT positive.  Hemoglobin dropped to the lowest of 6.6.  Given 2 units of PRBCs, 8.1 today.   ?EGD per dr. Michail Sermon 5/6 ?He hasnt had any stool since admit--augment miralax and Senna if prn ? ?Sepsis secondary to recurrent pneumonia ?-Patient with tachycardia, leukocytosis, chest x-ray finding of pneumonia. ?-Blood culture and pleural fluid culture did not show any growth. ?-Completed 7-day course of broad-spectrum antibiotics. ? ?Persistent left lower lobe infiltrate ?Adenocarcinoma of left lung s/p SRBT ?-Completed SBRT to the left lower lobe lung mass under the care  of Dr. Sondra Come completed on 10/15/2020. Currently on observation. ?-new findings of malignant cells inpleural fluid--OP f/u Dr. Earlie Server --I have cc him ?-Patient has poor functional ability.  Gets short of breath easily.   ?-Repeat chest x-ray on 5/3 showed a patchy infiltrate in the left lower lung field with slight improvement, findings suggestive of pneumonia, also has small to moderate left pleural effusion. ?-Palliative care consult appreciated. ? ?Acute on chronic respiratory failure  ?-Currently back to baseline, on 3 L/min. ? ?History of mild systolic CHF ?Essential hypertension ?-+ 1.1 continue Lasix IV 40 mg ?echocardiogram on 5/2 with EF 60 to 65%. ?-Home meds include Cardizem 180 mg daily, lisinopril 20 mg daily ?-Continue cardizem for now ? ?Type 2 diabetes mellitus ?-A1c 7.6 in March 2023 ?-Home meds include metformin ?-Currently metformin is on hold., CBG 150's ? ?History of pulmonary embolism ?- Eliquis held 2/2 low hemoglobin.  Last dose on the evening of 5/2.   ?  ?Chronic hyponatremia ?-appears has been having hyponatremia since 2022 ?-Currently on oral Lasix for hypervolemia.  Sodium level  stable. 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 ?Leg paresthesia ?-Reports acute bilateral feet paresthesia for past 3 days prior to admit. ?- contune Neurontin 300 mg twice daily as well as 900 mg at bedtime. ?  ?history of vasculitis and ankylosing spondylitis  ?-on methotrexate and mycophenolate at home ?Methotrexate held 2/2 hemoglobin below 8 ? ?Morbid obesity ?-BMI 43 ?-Recommend diet/lifestyle modification ?  ?Constipation ?-Continue stool softeners and as needed laxatives ?  ?Sacral pain /right hip pain  ?-after fall on 4/27, repeat cxr no acute findings, consider CT pelvis if pain persists ?  ?  Goals of care ?  Code Status: Full Code  ? ? ?Mobility: Limited because of shortness of breath ? ?Skin assessment:  ?  ? ?Nutritional status:  ?Body mass index is 41.89 kg/m?.  ?  ?  ? ? ? ? ?Diet:   ?Diet Order   ? ?       ?  Diet NPO time specified  Diet effective midnight       ?  ?  Diet Carb Modified Fluid consistency: Thin; Room service appropriate? Yes; Fluid restriction: 1200 mL Fluid  Diet effective now       ?  ? ?  ?  ? ?  ? ? ?DVT prophylaxis:  ?Place and maintain sequential compression device Start: 06/30/21 0828 ?  ?Antimicrobials: None currently ?Fluid: None ?Consultants: Pulmonology, oncology, palliative care, GI ?Family Communication: None at bedside ? ?Status is: Inpatient ? ?Continue in-hospital care because: Continues to be short of breath on minimal exertion, hemoglobin dropping, GI to see ?Level of care: Med-Surg  ? ?Dispo: The patient is from: Home ?             Anticipated d/c is to: SNF likely ?             Patient currently is not medically stable to d/c. ?  Difficult to place patient No ? ? ? ? ?Infusions:  ? methocarbamol (ROBAXIN) IV Stopped (06/23/21 1017)  ? ? ?Scheduled Meds: ? arformoterol  15 mcg Nebulization BID  ? aspirin EC  81 mg Oral Daily  ? buPROPion  300 mg Oral Daily  ? busPIRone  15 mg Oral Daily  ? Chlorhexidine Gluconate Cloth  6 each Topical Daily  ? diltiazem  180 mg Oral Daily  ? DULoxetine  60 mg Oral BID  ? feeding supplement (GLUCERNA SHAKE)  237 mL Oral TID BM  ? fluticasone  2 puff Inhalation BID  ? folic acid  1 mg Oral Daily  ? furosemide  40 mg Intravenous Daily  ? gabapentin  300 mg Oral BID  ? gabapentin  900 mg Oral QHS  ? insulin aspart  0-9 Units Subcutaneous TID WC  ? lidocaine  1 patch Transdermal Q24H  ? lurasidone  40 mg Oral QPC supper  ? melatonin  6 mg Oral QHS  ? mycophenolate  1,500 mg Oral BID  ? OXcarbazepine  600 mg Oral BID  ? oxcarbazepine  900 mg Oral QHS  ? pantoprazole (PROTONIX) IV  40 mg Intravenous Q24H  ? polyethylene glycol  17 g Oral BID  ? prednisoLONE acetate  1 drop Left Eye Q2H while awake  ? senna-docusate  1 tablet Oral BID  ? sodium chloride flush  10-40 mL Intracatheter Q12H  ? tamsulosin  0.4 mg Oral Daily  ? traMADol   100 mg Oral QHS  ? traZODone  75 mg Oral QHS  ? ? ?PRN meds: ?acetaminophen, albuterol, clobetasol ointment, lidocaine, lip balm, methocarbamol (ROBAXIN) IV, ondansetron (ZOFRAN) IV, oxyCODONE-acetaminophen, sodium chloride, sodium chloride flush  ? ?Antimicrobials: ?Anti-infectives (From admission, onward)  ? ? Start     Dose/Rate Route Frequency Ordered Stop  ? 06/17/21 0600  ceFEPIme (MAXIPIME) 2 g in sodium chloride 0.9 % 100 mL IVPB       ? 2 g ?200 mL/hr over 30 Minutes Intravenous Every 8 hours 06/17/21 0103 06/24/21 0801  ? 06/16/21 2115  vancomycin (VANCOCIN) IVPB 1000 mg/200 mL premix       ? 1,000 mg ?200 mL/hr over 60 Minutes Intravenous  Once 06/16/21 2101 06/16/21 2259  ? 06/16/21 2115  ceFEPIme (MAXIPIME) 2 g in sodium chloride 0.9 % 100 mL IVPB       ? 2 g ?200 mL/hr over 30 Minutes Intravenous  Once 06/16/21 2107 06/16/21 2146  ? ?  ? ? ?Objective: ?Vitals:  ? 07/02/21 0514 07/02/21 0801  ?BP: 130/75   ?Pulse: 87   ?Resp:    ?Temp: (!) 97.5 ?F (36.4 ?C)   ?SpO2: 100% 99%  ? ? ?Intake/Output Summary (Last 24 hours) at 07/02/2021 1428 ?Last data filed at 07/02/2021 1036 ?Gross per 24 hour  ?Intake 240 ml  ?Output --  ?Net 240 ml  ? ? ?Filed Weights  ? 06/16/21 1939 06/28/21 0500 07/02/21 0500  ?Weight: (!) 152.1 kg (!) 148.9 kg (!) 148 kg  ? ?Weight change:  ?Body mass index is 41.89 kg/m?.  ? ?Physical Exam: ? ?Eomi ncat thick neck, Mallampatti 4 ?Cta b no added sound no rales ?Abd soft, no jvd ?Some LE edema ?Neuro intact ? ?Data Review: I have personally reviewed the laboratory data and studies available. ? ?F/u labs ordered ?Unresulted Labs (From admission, onward)  ? ?  Start     Ordered  ? 06/29/21 0500  CBC with Differential/Platelet  Tomorrow morning,   R       ?Question:  Specimen collection method  Answer:  Lab=Lab collect  ? 06/28/21 1448  ? ?  ?  ? ?  ? ? ?Signed, ?Nita Sells, MD ?[facility] ?07/02/2021 ? ? ? ? ? ? ? ? ? ? ? ? ?

## 2021-07-03 ENCOUNTER — Encounter (HOSPITAL_COMMUNITY)
Admission: EM | Disposition: A | Discharge status: Home Health | Payer: Self-pay | Source: Home / Self Care | Attending: Internal Medicine

## 2021-07-03 ENCOUNTER — Inpatient Hospital Stay (HOSPITAL_COMMUNITY): Payer: No Typology Code available for payment source | Admitting: Anesthesiology

## 2021-07-03 ENCOUNTER — Encounter (HOSPITAL_COMMUNITY): Payer: Self-pay | Admitting: Internal Medicine

## 2021-07-03 DIAGNOSIS — K259 Gastric ulcer, unspecified as acute or chronic, without hemorrhage or perforation: Secondary | ICD-10-CM

## 2021-07-03 DIAGNOSIS — J189 Pneumonia, unspecified organism: Secondary | ICD-10-CM | POA: Diagnosis not present

## 2021-07-03 DIAGNOSIS — D509 Iron deficiency anemia, unspecified: Secondary | ICD-10-CM

## 2021-07-03 DIAGNOSIS — K209 Esophagitis, unspecified without bleeding: Secondary | ICD-10-CM

## 2021-07-03 DIAGNOSIS — K2970 Gastritis, unspecified, without bleeding: Secondary | ICD-10-CM

## 2021-07-03 HISTORY — PX: BIOPSY: SHX5522

## 2021-07-03 HISTORY — PX: ESOPHAGOGASTRODUODENOSCOPY: SHX5428

## 2021-07-03 LAB — COMPREHENSIVE METABOLIC PANEL
ALT: 22 U/L (ref 0–44)
AST: 17 U/L (ref 15–41)
Albumin: 2.9 g/dL — ABNORMAL LOW (ref 3.5–5.0)
Alkaline Phosphatase: 55 U/L (ref 38–126)
Anion gap: 8 (ref 5–15)
BUN: 18 mg/dL (ref 8–23)
CO2: 29 mmol/L (ref 22–32)
Calcium: 8.3 mg/dL — ABNORMAL LOW (ref 8.9–10.3)
Chloride: 96 mmol/L — ABNORMAL LOW (ref 98–111)
Creatinine, Ser: 0.79 mg/dL (ref 0.61–1.24)
GFR, Estimated: >60 mL/min (ref >60)
Glucose, Bld: 159 mg/dL — ABNORMAL HIGH (ref 70–99)
Potassium: 3.8 mmol/L (ref 3.5–5.1)
Sodium: 133 mmol/L — ABNORMAL LOW (ref 135–145)
Total Bilirubin: 0.4 mg/dL (ref 0.3–1.2)
Total Protein: 5.4 g/dL — ABNORMAL LOW (ref 6.5–8.1)

## 2021-07-03 LAB — CBC
HCT: 23.3 % — ABNORMAL LOW (ref 39.0–52.0)
Hemoglobin: 7.6 g/dL — ABNORMAL LOW (ref 13.0–17.0)
MCH: 32.1 pg (ref 26.0–34.0)
MCHC: 32.6 g/dL (ref 30.0–36.0)
MCV: 98.3 fL (ref 80.0–100.0)
Platelets: 436 10*3/uL — ABNORMAL HIGH (ref 150–400)
RBC: 2.37 MIL/uL — ABNORMAL LOW (ref 4.22–5.81)
RDW: 18 % — ABNORMAL HIGH (ref 11.5–15.5)
WBC: 7.5 10*3/uL (ref 4.0–10.5)
nRBC: 0.3 % — ABNORMAL HIGH (ref 0.0–0.2)

## 2021-07-03 LAB — PREPARE RBC (CROSSMATCH)

## 2021-07-03 LAB — GLUCOSE, CAPILLARY
Glucose-Capillary: 141 mg/dL — ABNORMAL HIGH (ref 70–99)
Glucose-Capillary: 164 mg/dL — ABNORMAL HIGH (ref 70–99)
Glucose-Capillary: 183 mg/dL — ABNORMAL HIGH (ref 70–99)
Glucose-Capillary: 194 mg/dL — ABNORMAL HIGH (ref 70–99)

## 2021-07-03 SURGERY — EGD (ESOPHAGOGASTRODUODENOSCOPY)
Anesthesia: Monitor Anesthesia Care

## 2021-07-03 MED ORDER — PROPOFOL 500 MG/50ML IV EMUL
INTRAVENOUS | Status: AC
  Filled 2021-07-03: qty 50

## 2021-07-03 MED ORDER — DIPHENHYDRAMINE HCL 25 MG PO CAPS
25.0000 mg | ORAL_CAPSULE | Freq: Once | ORAL | Status: AC
  Administered 2021-07-03: 25 mg via ORAL
  Filled 2021-07-03: qty 1

## 2021-07-03 MED ORDER — PANTOPRAZOLE SODIUM 40 MG PO TBEC
40.0000 mg | DELAYED_RELEASE_TABLET | Freq: Two times a day (BID) | ORAL | Status: DC
  Administered 2021-07-03 – 2021-07-06 (×7): 40 mg via ORAL
  Filled 2021-07-03 (×7): qty 1

## 2021-07-03 MED ORDER — FUROSEMIDE 10 MG/ML IJ SOLN
20.0000 mg | Freq: Once | INTRAMUSCULAR | Status: AC
  Administered 2021-07-04: 20 mg via INTRAVENOUS
  Filled 2021-07-03: qty 2

## 2021-07-03 MED ORDER — PROPOFOL 1000 MG/100ML IV EMUL
INTRAVENOUS | Status: AC
  Filled 2021-07-03: qty 100

## 2021-07-03 MED ORDER — LACTATED RINGERS IV SOLN
INTRAVENOUS | Status: AC | PRN
Start: 2021-07-03 — End: 2021-07-03
  Administered 2021-07-03: 20 mL/h via INTRAVENOUS

## 2021-07-03 MED ORDER — LIDOCAINE 2% (20 MG/ML) 5 ML SYRINGE
INTRAMUSCULAR | Status: DC | PRN
  Administered 2021-07-03: 100 mg via INTRAVENOUS

## 2021-07-03 MED ORDER — SODIUM CHLORIDE 0.9% IV SOLUTION: Freq: Once | INTRAVENOUS | Status: AC

## 2021-07-03 MED ORDER — SODIUM CHLORIDE 0.9 % IV SOLN: INTRAVENOUS | Status: DC

## 2021-07-03 MED ORDER — PROPOFOL 10 MG/ML IV BOLUS
INTRAVENOUS | Status: AC
  Filled 2021-07-03: qty 20

## 2021-07-03 MED ORDER — PROPOFOL 500 MG/50ML IV EMUL
INTRAVENOUS | Status: DC | PRN
  Administered 2021-07-03: 200 ug/kg/min via INTRAVENOUS

## 2021-07-03 MED ORDER — PREDNISOLONE ACETATE 1 % OP SUSP
1.0000 [drp] | OPHTHALMIC | Status: DC
Start: 2021-07-03 — End: 2021-07-06
  Administered 2021-07-03 – 2021-07-06 (×22): 1 [drp] via OPHTHALMIC
  Filled 2021-07-03 (×2): qty 5

## 2021-07-03 MED ORDER — SODIUM CHLORIDE 0.9 % IV SOLN
250.0000 mg | Freq: Every day | INTRAVENOUS | Status: AC
  Administered 2021-07-03 – 2021-07-04 (×2): 250 mg via INTRAVENOUS
  Filled 2021-07-03 (×2): qty 20

## 2021-07-03 MED ORDER — METHOCARBAMOL 500 MG PO TABS
500.0000 mg | ORAL_TABLET | Freq: Three times a day (TID) | ORAL | Status: DC
  Administered 2021-07-03 – 2021-07-06 (×9): 500 mg via ORAL
  Filled 2021-07-03 (×9): qty 1

## 2021-07-03 MED ORDER — PROPOFOL 10 MG/ML IV BOLUS
INTRAVENOUS | Status: DC | PRN
  Administered 2021-07-03: 10 mg via INTRAVENOUS
  Administered 2021-07-03: 30 mg via INTRAVENOUS

## 2021-07-03 NOTE — Brief Op Note (Signed)
06/16/2021 - 07/03/2021 ? ?8:07 AM ? ?PATIENT:  Mario [name]  71 y.o. male ? ?PRE-OPERATIVE DIAGNOSIS:  symptomatic anemia ? ?POST-OPERATIVE DIAGNOSIS:  gastric ulcers, bx. gastritis ? ?PROCEDURE:  Procedure(s): ?ESOPHAGOGASTRODUODENOSCOPY (EGD) (N/A) ?BIOPSY ? ?SURGEON:  Surgeon(s) and Role: ?   * Otis Brace, MD - Primary ? ?Findings ?----------- ?-EGD showed LA grade a esophagitis and multiple superficial gastric ulcers.  No evidence of active bleeding.  Biopsies taken. ? ?Recommendations ?-------------------------- ?-Soft diet and advance as tolerated ?-Recommend Protonix 40 mg twice a day for 8 weeks followed by Protonix 40 mg once a day for another 4 weeks. ?-Would be a high risk candidate for colonoscopy because of multiple comorbidities. ?-Okay to resume anticoagulation after 48 hours. ?-GI will sign off.  Call us back if needed ? ?Otis Brace MD, FACP ?07/03/2021, 8:08 AM ? ?Contact #  [phone]  ? ?

## 2021-07-03 NOTE — Progress Notes (Signed)
?PROGRESS NOTE ? ?Mario [name]  ?DOB: Apr 16, 1950  ?PCP: [facility] ?XTG:[id number]  ?DOA: 06/16/2021 ? LOS: 16 days  ?Hospital Day: 18 ? ?Brief narrative: ? 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   ? ?In the ED, tachycardic.  Chest x-ray - patchy infiltrate in the left lower lung. ?S/p multiple recent admissions for recurrent pneumonia.  Pulmonary consultation was called because of nonresolving pneumonia. ? ?Recent hospitalizations = 3 since 2023: ?3/22-3/24, patient was admitted with hypoxia.  CT chest at that time showed a left sided consolidation suspicious for infection.  It was treated with Augmentin/azithromycin, steroids.  He was discharged on 4 L by nasal cannula. ?Readmitted  3/24 to 3/27 -completed course of antibiotics. ?4/6-4/13, readmitted for right lower lobe pneumonia and left lower pleural effusion.  Completed a course of IV cefepime.  Current events ? ? ?4/19-presented again with left lung pneumonia, noting O2 sat 76%.-Started on cefepime Solu-Medrol ?4/24-pulmonology consulted ?4/26-thoracentesis 600 cc performed-not a candidate for Pleurx-CCM signed off 4/28 ? ? ? ?Subjective: ? ?Patient gets winded when he does simple stepping to the bedside commode-this is worsened over the past year-we had a good conversation today regarding goals of care-I asked him to consider planning, advanced planning with regards to his chronic lung issues ? ?Assessment and Plan: ?Acute anemia ?EGD on 5/6 showed ulcrs but no stigmata bleeding- ?-FOBT positive.  EGD as above, ?He hasnt had any stool since admit--augment miralax and Senna if prn ?Iron studies 5/4 noted to be severely iron deficient ?We will transfuse 1 unit  in addition to administer IV iron today ?GI feels risk/benefit outweighs colonoscopy given his chronic respiratory issues at this time ?-I have asked patient to think carefully about overall goals of care-  Will CC Dr. Earlie Server -- patient has filled out a MOST form and will discuss this with Dr. Earlie Server in the outpatient setting most likely ? ?Sepsis secondary to recurrent pneumonia ?-Patient with tachycardia, leukocytosis, chest x-ray finding of pneumonia. ?-Blood culture and pleural fluid culture did not show any growth. ?-Completed 7-day course of broad-spectrum antibiotics. ? ?Persistent left lower lobe infiltrate ?Adenocarcinoma of left lung s/p SRBT ?-Completed SBRT to the left lower lobe lung mass under the care of Dr. Sondra Come  10/15/2020. Currently on observation. ?-new findings of malignant cells inpleural fluid--OP f/u Dr. Earlie Server --I have cc him.   ?-Repeat chest x-ray on 5/3 showed a patchy infiltrate in the left lower lung field with slight improvement, findings suggestive of pneumonia, also has small to moderate left pleural effusion. ?-Palliative care consult appreciated. ? ?Acute on chronic respiratory failure  ?-Currently back to baseline, on 3 L/min. ? ?History of mild systolic CHF ?Essential hypertension ?--1.1 L continue Lasix IV 40 mg ?echocardiogram on 5/2 with EF 60 to 65%. ?-Resumed PTA Cardizem 180 mg daily,  ?Have held from admission lisinopril 20 mg daily ? ?Type 2 diabetes mellitus ?-A1c 7.6 in March 2023 ?-Home meds include metformin ?-Currently metformin is on hold., CBG 150's ? ?History of pulmonary embolism 02/18/2022 ?- Eliquis held 2/2 low hemoglobin.  Last dose on the evening of 5/2.   ?-May need to discharge home on Lovenox?  Will ask oncology for nonemergent opinion as it has been about 5  months from index pulmonary embolism ?  ?Chronic hyponatremia ?-appears has been having hyponatremia since 2022 ?-Currently on oral Lasix for hypervolemia.  Sodium level  stable. ?-?SIADH from  lung Ca ? ?Leg paresthesia ?-Reports acute bilateral feet paresthesia for past 3 days prior to admit. ?- contune Neurontin 300 mg twice daily as well as 900 mg at bedtime---very high doses, consider weaning in the outpatient setting ?-Consider reading Trileptal additionally as is on pretty high doses ?  ?history of vasculitis and ankylosing spondylitis  ?-on methotrexate and mycophenolate at home ?Methotrexate held 2/2 hemoglobin below 8 ? ?Morbid obesity ?-BMI 43 ?-Recommend diet/lifestyle modification ?  ?Constipation ?-Continue stool softeners and as needed laxatives ?  ?Sacral pain /right hip pain  ?-after fall on 4/27, repeat cxr no acute findings ?-Hold CT of back-he has underlying chronic pain ?  ?Goals of care ?  Code Status: Full Code  ? ? ?Mobility: Limited because of shortness of breath ? ?Skin assessment:  ?  ? ?Nutritional status:  ?Body mass index is 41.89 kg/m?.  ?  ?  ? ? ? ? ?Diet:  ?Diet Order   ? ?       ?  DIET SOFT Room service appropriate? Yes; Fluid consistency: Thin  Diet effective now       ?  ? ?  ?  ? ?  ? ? ?DVT prophylaxis:  ?Place and maintain sequential compression device Start: 06/30/21 0828 ?  ?Antimicrobials: None currently ?Fluid: None ?Consultants: Pulmonology, oncology, palliative care, GI ?Family Communication: None at bedside ? ?Status is: Inpatient ? ?Continue in-hospital care because:  ?Is another transfusion-will need skilled placement ?Level of care: Med-Surg  ? ?Dispo: The patient is from: Home ?             Anticipated d/c is to: SNF likely ?             Patient currently is not medically stable to d/c. ?  Difficult to place patient No ? ? ? ? ?Infusions:  ? methocarbamol (ROBAXIN) IV Stopped (06/23/21 9562)  ? ? ?Scheduled Meds: ? arformoterol  15 mcg Nebulization BID  ? aspirin EC  81 mg Oral Daily  ? buPROPion  300 mg Oral Daily  ? busPIRone  15 mg Oral Daily  ? Chlorhexidine Gluconate Cloth  6 each Topical Daily  ? diltiazem  180 mg Oral Daily  ? DULoxetine  60 mg  Oral BID  ? feeding supplement (GLUCERNA SHAKE)  237 mL Oral TID BM  ? fluticasone  2 puff Inhalation BID  ? folic acid  1 mg Oral Daily  ? furosemide  40 mg Intravenous Daily  ? gabapentin  300 mg Oral BID  ? gabapentin  900 mg Oral QHS  ? insulin aspart  0-9 Units Subcutaneous TID WC  ? lidocaine  1 patch Transdermal Q24H  ? lurasidone  40 mg Oral QPC supper  ? melatonin  6 mg Oral QHS  ? mycophenolate  1,500 mg Oral BID  ? OXcarbazepine  600 mg Oral BID  ? oxcarbazepine  900 mg Oral QHS  ? pantoprazole  40 mg Oral BID  ? polyethylene glycol  17 g Oral BID  ? prednisoLONE acetate  1 drop Left Eye Q2H while awake  ? senna-docusate  1 tablet Oral BID  ? sodium chloride flush  10-40 mL Intracatheter Q12H  ? tamsulosin  0.4 mg Oral Daily  ? traMADol  100 mg Oral QHS  ? traZODone  75 mg Oral  QHS  ? ? ?PRN meds: ?acetaminophen, albuterol, clobetasol ointment, lidocaine, lip balm, methocarbamol (ROBAXIN) IV, ondansetron (ZOFRAN) IV, oxyCODONE-acetaminophen, sodium chloride, sodium chloride flush  ? ?Antimicrobials: ?Anti-infectives (From admission, onward)  ? ? Start     Dose/Rate Route Frequency Ordered Stop  ? 06/17/21 0600  ceFEPIme (MAXIPIME) 2 g in sodium chloride 0.9 % 100 mL IVPB       ? 2 g ?200 mL/hr over 30 Minutes Intravenous Every 8 hours 06/17/21 0103 06/24/21 0801  ? 06/16/21 2115  vancomycin (VANCOCIN) IVPB 1000 mg/200 mL premix       ? 1,000 mg ?200 mL/hr over 60 Minutes Intravenous  Once 06/16/21 2101 06/16/21 2259  ? 06/16/21 2115  ceFEPIme (MAXIPIME) 2 g in sodium chloride 0.9 % 100 mL IVPB       ? 2 g ?200 mL/hr over 30 Minutes Intravenous  Once 06/16/21 2107 06/16/21 2146  ? ?  ? ? ?Objective: ?Vitals:  ? 07/03/21 0906 07/03/21 1258  ?BP: (!) 157/83 128/68  ?Pulse: 88 95  ?Resp: 18 19  ?Temp:  98.3 ?F (36.8 ?C)  ?SpO2: 100% 96%  ? ? ?Intake/Output Summary (Last 24 hours) at 07/03/2021 1455 ?Last data filed at 07/03/2021 8341 ?Gross per 24 hour  ?Intake 340 ml  ?Output 800 ml  ?Net -460 ml  ? ? ?Filed  Weights  ? 06/16/21 1939 06/28/21 0500 07/02/21 0500  ?Weight: (!) 152.1 kg (!) 148.9 kg (!) 148 kg  ? ?Weight change:  ?Body mass index is 41.89 kg/m?.  ? ?Physical Exam: ? ?Coherent pleasant thick neck Ma

## 2021-07-03 NOTE — Op Note (Signed)
[facility] ?Patient Name: Mario [name] ?Procedure Date: 07/03/2021 ?MRN: [id number] ?Attending MD: Otis Brace , MD ?Date of Birth: February 06, 1951 ?CSN: [id number] ?Age: 71 ?Admit Type: Inpatient ?Procedure:                Upper GI endoscopy ?Indications:              Iron deficiency anemia, Heme positive stool ?Providers:                Otis Brace, MD, Grace Isaac, RN, [place]  ?                          Houle, Technician ?Referring MD:              ?Medicines:                Sedation Administered by an Anesthesia Professional ?Complications:            No immediate complications. ?Estimated Blood Loss:     Estimated blood loss was minimal. ?Procedure:                Pre-Anesthesia Assessment: ?                          - Prior to the procedure, a History and Physical  ?                          was performed, and patient medications and  ?                          allergies were reviewed. The patient's tolerance of  ?                          previous anesthesia was also reviewed. The risks  ?                          and benefits of the procedure and the sedation  ?                          options and risks were discussed with the patient.  ?                          All questions were answered, and informed consent  ?                          was obtained. Prior Anticoagulants: The patient has  ?                          taken Eliquis (apixaban), last dose was 2 days  ?                          prior to procedure. ASA Grade Assessment: III - A  ?                          patient with severe systemic disease. After  ?  reviewing the risks and benefits, the patient was  ?                          deemed in satisfactory condition to undergo the  ?                          procedure. ?                          After obtaining informed consent, the endoscope was  ?                          passed under direct vision. Throughout the  ?                           procedure, the patient's blood pressure, pulse, and  ?                          oxygen saturations were monitored continuously. The  ?                          GIF-H190 ([id number] Olympus endoscope was introduced  ?                          through the mouth, and advanced to the second part  ?                          of duodenum. The upper GI endoscopy was  ?                          accomplished without difficulty. The patient  ?                          tolerated the procedure well. ?Scope In: ?Scope Out: ?Findings: ?     LA Grade A (one or more mucosal breaks less than 5 mm, not extending  ?     between tops of 2 mucosal folds) esophagitis with no bleeding was found  ?     at the gastroesophageal junction. ?     The Z-line was regular and was found 42 cm from the incisors. ?     Many non-bleeding superficial gastric ulcers with no stigmata of  ?     bleeding were found in the prepyloric region of the stomach. The largest  ?     lesion was 7 mm in largest dimension. Biopsies were taken with a cold  ?     forceps for histology. ?     The cardia and gastric fundus were normal on retroflexion. ?     The duodenal bulb and first portion of the duodenum were normal. D2 was  ?     not well visualized but no bleeding seen . ?Impression:               - LA Grade A esophagitis with no bleeding. ?                          - Z-line regular, 42 cm from  the incisors. ?                          - Non-bleeding gastric ulcers with no stigmata of  ?                          bleeding. Biopsied. ?                          - Normal duodenal bulb and first portion of the  ?                          duodenum. D2 was not well visualized but no  ?                          bleeding seen. ?Moderate Sedation: ?     Moderate (conscious) sedation was personally administered by an  ?     anesthesia professional. The following parameters were monitored: oxygen  ?     saturation, heart rate, blood pressure, and response to care. ?Recommendation:            - Return patient to hospital ward for ongoing care. ?                          - Soft diet. ?                          - Continue present medications. ?                          - Await pathology results. ?Procedure Code(s):        --- Professional --- ?                          [phone], Esophagogastroduodenoscopy, flexible,  ?                          transoral; with biopsy, single or multiple ?Diagnosis Code(s):        --- Professional --- ?                          K20.90, Esophagitis, unspecified without bleeding ?                          K25.9, Gastric ulcer, unspecified as acute or  ?                          chronic, without hemorrhage or perforation ?                          D50.9, Iron deficiency anemia, unspecified ?                          R19.5, Other fecal abnormalities ?CPT copyright 2019 [employer]. All rights reserved. ?The codes documented in this report are preliminary and upon coder review may  ?be revised to meet current compliance requirements. ?Otis Brace, MD ?Otis Brace, MD ?07/03/2021 8:07:10 AM ?  Number of Addenda: 0 ?

## 2021-07-03 NOTE — TOC Progression Note (Signed)
Transition of Care (TOC) - Progression Note  ? ? ?Patient Details  ?Name: Mario [name] ?MRN: [id number] ?Date of Birth: 1950-08-24 ? ?Transition of Care (TOC) CM/SW Contact  ?Ross Ludwig, LCSW ?Phone Number: ?07/03/2021, 6:30 PM ? ?Clinical Narrative:    ? ?CSW received phone call from patient's insurance company.  The insurance company is sending patient's information to the medical director to review.  Patient will need to be seen by PT either Sunday or Monday for updated therapy notes.  CSW to continue to follow patient's progress throughout discharge planning. ? ? ?Expected Discharge Plan: [place] ?Barriers to Discharge: Continued Medical Work up ? ?Expected Discharge Plan and Services ?Expected Discharge Plan: [place] ?  ?  ?Post Acute Care Choice: Home Health ?Living arrangements for the past 2 months: [place] ?                ?  ?  ?  ?  ?  ?HH Arranged: PT ?[place] Agency: [facility] ?Date HH Agency Contacted: 06/20/21 ?Time [place]: [id number] ?Representative spoke with at [place]: [name] ? ? ?Social Determinants of Health (SDOH) Interventions ?  ? ?Readmission Risk Interventions ? ?  06/07/2021  ?  4:15 PM  ?Readmission Risk Prevention Plan  ?Transportation Screening Complete  ?Medication Review Press photographer) Complete  ?PCP or Specialist appointment within 3-5 days of discharge Complete  ?[place] or Home Care Consult Complete  ?SW Recovery Care/Counseling Consult Complete  ?Palliative Care Screening Not Applicable  ?[place] Not Applicable  ? ? ?

## 2021-07-03 NOTE — Progress Notes (Signed)
Pt states he would like to try to wear cpap tonight. Pt set up on cpap. Pt said he would let the RN know when he is ready to wear the machine. Cpap ready to be worn. RT will continue to monitor.  ?

## 2021-07-03 NOTE — Anesthesia Postprocedure Evaluation (Signed)
Anesthesia Post Note ? ?Patient: Mario [name] ? ?Procedure(s) Performed: ESOPHAGOGASTRODUODENOSCOPY (EGD) ?BIOPSY ? ?  ? ?Patient location during evaluation: PACU ?Anesthesia Type: MAC ?Level of consciousness: awake and alert ?Pain management: pain level controlled ?Vital Signs Assessment: post-procedure vital signs reviewed and stable ?Respiratory status: spontaneous breathing, nonlabored ventilation and respiratory function stable ?Cardiovascular status: blood pressure returned to baseline ?Postop Assessment: no apparent nausea or vomiting ?Anesthetic complications: no ? ? ?No notable events documented. ? ?Last Vitals:  ?Vitals:  ? 07/03/21 0815 07/03/21 0830  ?BP:  (!) 113/56  ?Pulse: 87   ?Resp: 14 16  ?Temp:  36.4 ?C  ?SpO2: 99%   ?  ?Last Pain:  ?Vitals:  ? 07/03/21 0830  ?TempSrc:   ?PainSc: 0-No pain  ? ? ?  ?  ?  ?  ?  ?  ? ?Marthenia Rolling ? ? ? ? ?

## 2021-07-03 NOTE — Progress Notes (Signed)
[facility] Progress Note ? ?Mario [name] 71 y.o. [birth year] ? ?CC:   ?Anemia, heme positive stool ? ?Subjective: ?Patient seen and examined at bedside.  Currently on 4 L of oxygen.  Feeling short of breath because of recent ambulation.  Denies seeing any blood in the stool or black stool ? ?ROS : Positive for shortness of breath and weakness. ? ? ?Objective: ?Vital signs in last 24 hours: ?Vitals:  ? 07/03/21 0519 07/03/21 0734  ?BP: 129/76 (!) 182/85  ?Pulse: 95 (!) 101  ?Resp: 20 (!) 21  ?Temp: 98 ?F (36.7 ?C) (!) 97.5 ?F (36.4 ?C)  ?SpO2: 98%   ? ? ?Physical Exam: ? ?General : Currently resting in the bed, mild short of breath otherwise no acute distress ?Lungs -bibasilar rhonchi.  Decreased breath sounds. ?Heart -rate and rhythm regular ?Abdomen -obese abdomen, mildly distended.  Bowel sounds present.  No peritoneal sign ? ?Lab Results: ?Recent Labs  ?  07/02/21 ?0500 07/03/21 ?0459  ?NA 131* 133*  ?K 4.0 3.8  ?CL 94* 96*  ?CO2 29 29  ?GLUCOSE 186* 159*  ?BUN 19 18  ?CREATININE 0.80 0.79  ?CALCIUM 8.1* 8.3*  ? ?Recent Labs  ?  07/03/21 ?0459  ?AST 17  ?ALT 22  ?ALKPHOS 55  ?BILITOT 0.4  ?PROT 5.4*  ?ALBUMIN 2.9*  ? ?Recent Labs  ?  07/01/21 ?0354 07/02/21 ?0500 07/03/21 ?0459  ?WBC 9.3 7.8 7.5  ?NEUTROABS 6.2 4.8  --   ?HGB 8.1* 7.7* 7.6*  ?HCT 25.2* 23.3* 23.3*  ?MCV 97.3 97.9 98.3  ?PLT 434* 430* 436*  ? ?No results for input(s): LABPROT, INR in the last 72 hours. ? ? ? ?Assessment/Plan: ?-Anemia with heme positive stool.  No overt bleeding. ?-History of lung cancer s/p SRBT. ?-History of pulmonary embolism.  Eliquis currently on hold. ?-Acute on chronic respiratory failure.  Currently on 3 to 4 L of oxygen ?-Recurrent pneumonia ?-Morbid obesity ? ?Recommendation ?--------------------------  ?-Proceed with EGD today with minimal sedation.  Discussed with CRNA at bedside. ? ?Risks (bleeding, infection, bowel perforation that could require surgery, sedation-related changes in cardiopulmonary  systems), benefits (identification and possible treatment of source of symptoms, exclusion of certain causes of symptoms), and alternatives (watchful waiting, radiographic imaging studies, empiric medical treatment)  were explained to patient/family in detail and patient wishes to proceed.  ? ? ?Otis Brace MD, FACP ?07/03/2021, 7:40 AM ? ?Contact #  [phone]  ?

## 2021-07-03 NOTE — Transfer of Care (Signed)
Immediate Anesthesia Transfer of Care Note ? ?Patient: Mario [name] ? ?Procedure(s) Performed: ESOPHAGOGASTRODUODENOSCOPY (EGD) ?BIOPSY ? ?Patient Location: PACU ? ?Anesthesia Type:MAC ? ?Level of Consciousness: sedated and patient cooperative ? ?Airway & Oxygen Therapy: Patient Spontanous Breathing and Patient connected to nasal cannula oxygen ? ?Post-op Assessment: Report given to RN, Post -op Vital signs reviewed and stable and Patient moving all extremities ? ?Post vital signs: Reviewed and stable ? ?Last Vitals:  ?Vitals Value Taken Time  ?BP 105/64 07/03/21 0810  ?Temp 36.1 ?C 07/03/21 0809  ?Pulse 87 07/03/21 0812  ?Resp 12 07/03/21 0812  ?SpO2 99 % 07/03/21 0812  ?Vitals shown include unvalidated device data. ? ?Last Pain:  ?Vitals:  ? 07/03/21 0809  ?TempSrc:   ?PainSc: 0-No pain  ?   ? ?Patients Stated Pain Goal: 0 (07/02/21 1343) ? ?Complications: No notable events documented. ?

## 2021-07-04 ENCOUNTER — Encounter (HOSPITAL_COMMUNITY): Payer: Self-pay | Admitting: Gastroenterology

## 2021-07-04 ENCOUNTER — Inpatient Hospital Stay (HOSPITAL_COMMUNITY): Payer: No Typology Code available for payment source

## 2021-07-04 DIAGNOSIS — J189 Pneumonia, unspecified organism: Secondary | ICD-10-CM | POA: Diagnosis not present

## 2021-07-04 LAB — GLUCOSE, CAPILLARY
Glucose-Capillary: 146 mg/dL — ABNORMAL HIGH (ref 70–99)
Glucose-Capillary: 148 mg/dL — ABNORMAL HIGH (ref 70–99)
Glucose-Capillary: 151 mg/dL — ABNORMAL HIGH (ref 70–99)
Glucose-Capillary: 214 mg/dL — ABNORMAL HIGH (ref 70–99)
Glucose-Capillary: 167 mg/dL — ABNORMAL HIGH (ref 70–99)

## 2021-07-04 LAB — COMPREHENSIVE METABOLIC PANEL
ALT: 21 U/L (ref 0–44)
AST: 18 U/L (ref 15–41)
Albumin: 3.3 g/dL — ABNORMAL LOW (ref 3.5–5.0)
Alkaline Phosphatase: 61 U/L (ref 38–126)
Anion gap: 7 (ref 5–15)
BUN: 17 mg/dL (ref 8–23)
CO2: 30 mmol/L (ref 22–32)
Calcium: 8.2 mg/dL — ABNORMAL LOW (ref 8.9–10.3)
Chloride: 96 mmol/L — ABNORMAL LOW (ref 98–111)
Creatinine, Ser: 0.87 mg/dL (ref 0.61–1.24)
GFR, Estimated: >60 mL/min (ref >60)
Glucose, Bld: 174 mg/dL — ABNORMAL HIGH (ref 70–99)
Potassium: 3.7 mmol/L (ref 3.5–5.1)
Sodium: 133 mmol/L — ABNORMAL LOW (ref 135–145)
Total Bilirubin: 0.5 mg/dL (ref 0.3–1.2)
Total Protein: 5.6 g/dL — ABNORMAL LOW (ref 6.5–8.1)

## 2021-07-04 LAB — CBC
HCT: 26.9 % — ABNORMAL LOW (ref 39.0–52.0)
Hemoglobin: 8.7 g/dL — ABNORMAL LOW (ref 13.0–17.0)
MCH: 31.6 pg (ref 26.0–34.0)
MCHC: 32.3 g/dL (ref 30.0–36.0)
MCV: 97.8 fL (ref 80.0–100.0)
Platelets: 408 10*3/uL — ABNORMAL HIGH (ref 150–400)
RBC: 2.75 MIL/uL — ABNORMAL LOW (ref 4.22–5.81)
RDW: 17.5 % — ABNORMAL HIGH (ref 11.5–15.5)
WBC: 7.9 10*3/uL (ref 4.0–10.5)
nRBC: 0 % (ref 0.0–0.2)

## 2021-07-04 NOTE — Progress Notes (Signed)
?PROGRESS NOTE ? ?Mario [name]  ?DOB: 1950-03-20  ?PCP: [facility] ?UKG:[id number]  ?DOA: 06/16/2021 ? LOS: 17 days  ?Hospital Day: 19 ? ?Brief narrative: ? 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   ? ?In the ED, tachycardic.  Chest x-ray - patchy infiltrate in the left lower lung. ?S/p multiple recent admissions for recurrent pneumonia.  Pulmonary consultation was called because of nonresolving pneumonia. ? ?Recent hospitalizations = 3 since 2023: ?3/22-3/24, patient was admitted with hypoxia.  CT chest at that time showed a left sided consolidation suspicious for infection.  It was treated with Augmentin/azithromycin, steroids.  He was discharged on 4 L by nasal cannula. ?Readmitted  3/24 to 3/27 -completed course of antibiotics. ?4/6-4/13, readmitted for right lower lobe pneumonia and left lower pleural effusion.  Completed a course of IV cefepime.  Current events ? ? ?4/19-presented again with left lung pneumonia, noting O2 sat 76%.-Started on cefepime Solu-Medrol ?4/24-pulmonology consulted ?4/26-thoracentesis 600 cc performed-not a candidate for Pleurx-CCM signed off 4/2 ? ?Subjective: ? ?Doing fair today when I saw him in the morning-was able to use the CPAP machine without too much effect of the nose being troubled by it ?No chest pain no fever no chills no nausea no vomiting-feels some better but not completely ? ?Assessment and Plan: ?Acute anemia ?EGD on 5/6 showed ulcrs but no stigmata bleeding- ?-FOBT positive.  EGD as above, ?He hasnt had any stool since admit--augment miralax and Senna ?Transfused 1 unit additionally 5/6-iron infusions given 5/6 and 5/7 ?GI feels risk/benefit outweighs colonoscopy  given his chronic respiratory issues at this time ?-I have asked Dr. Earlie [name] to weigh in on this case later this week-- patient has filled out a MOST form ? ?Sepsis secondary to recurrent pneumonia ?-Patient with tachycardia, leukocytosis, chest x-ray finding of pneumonia. ?-Blood culture and pleural fluid culture did not show any growth. ?-Completed 7-day course of broad-spectrum antibiotics, and this problem has resolved completely ? ?Persistent left lower lobe infiltrate ?Adenocarcinoma of left lung s/p SRBT ?-Completed SBRT to the left lower lobe lung mass under the care of Dr. Sondra [name]  10/15/2020. Currently on observation. ?-new findings of malignant cells inpleural fluid--OP f/u Dr. Earlie [name] --I have cc him.   ?-Repeat chest x-ray on 5/3 showed a patchy infiltrate in the left lower lung field with slight improvement, findings suggestive of pneumonia--holding ABX at this time --this is likely his cancer  ? ?Acute on chronic respiratory failure  ?-Currently back to baseline, on 3 L/min. ? ?History of mild systolic CHF ?Essential hypertension ?+1.7 L today?  Despite being on ?Lasix IV 40 mg--- unclear if I/O is accurate ?echocardiogram on 5/2 with EF 60 to 65%. ?-Resumed PTA Cardizem 180 mg daily,  ?Have held from admission lisinopril 20 mg daily ? ?Type 2 diabetes mellitus ?-A1c 7.6 in March 2023 ?-Home meds include metformin ?-Currently metformin is on hold., CBG 150s ? ?History of pulmonary embolism 02/18/2022 ?- Eliquis held 2/2 low hemoglobin.  Last dose on the evening of 5/2.   ?-May need to discharge home on Lovenox?  Will ask oncology for nonemergent opinion as it has been about 5 months from index pulmonary embolism ?  ?Chronic hyponatremia ?-appears has been having hyponatremia since 2022 ?-Currently  on oral Lasix for hypervolemia.  Sodium level relatively stable in the 130 range ? ?Leg paresthesia ?-Reports acute bilateral feet paresthesia for past 3 days prior to admit. ?- contune Neurontin 300 mg  twice daily as well as 900 mg at bedtime---very high doses, consider weaning in the outpatient setting ?-Consider reading Trileptal additionally as is on pretty high doses ?  ?history of vasculitis and ankylosing spondylitis  ?-on methotrexate and mycophenolate at home ?Methotrexate held 2/2 hemoglobin below 8 ? ?Morbid obesity ?-BMI 43 ?-Recommend diet/lifestyle modification ?  ?Constipation ?-Continue stool softeners and as needed laxatives ?  ?Sacral pain /right hip pain  ?-after fall on 4/27, repeat cxr no acute findings ?-Hold CT of back-he has underlying chronic pain ?  ?Goals of care ?  Code Status: Full Code  ? ? ?Diet:  ?Diet Order   ? ?       ?  DIET SOFT Room service appropriate? Yes; Fluid consistency: Thin  Diet effective now       ?  ? ?  ?  ? ?  ? ? ?DVT prophylaxis:  ?Place and maintain sequential compression device Start: 06/30/21 0828 ?  ?Antimicrobials: None currently ?Fluid: None ?Consultants: Pulmonology, oncology, palliative care, GI ?Family Communication: None at bedside ? ?Status is: Inpatient ? ?Awaiting skilled placement in the next several days-TOC engaged to assist with disposition planning ? ?Scheduled Meds: ? arformoterol  15 mcg Nebulization BID  ? aspirin EC  81 mg Oral Daily  ? buPROPion  300 mg Oral Daily  ? busPIRone  15 mg Oral Daily  ? Chlorhexidine Gluconate Cloth  6 each Topical Daily  ? diltiazem  180 mg Oral Daily  ? DULoxetine  60 mg Oral BID  ? feeding supplement (GLUCERNA SHAKE)  237 mL Oral TID BM  ? fluticasone  2 puff Inhalation BID  ? folic acid  1 mg Oral Daily  ? furosemide  40 mg Intravenous Daily  ? gabapentin  300 mg Oral BID  ? gabapentin  900 mg Oral QHS  ? insulin aspart  0-9 Units Subcutaneous TID WC  ? lidocaine  1 patch Transdermal Q24H  ? lurasidone  40 mg Oral QPC supper  ? melatonin  6 mg Oral QHS  ? methocarbamol  500 mg Oral TID  ? mycophenolate  1,500 mg Oral BID  ? OXcarbazepine  600 mg Oral BID  ? oxcarbazepine  900 mg Oral QHS  ? pantoprazole  40 mg  Oral BID  ? polyethylene glycol  17 g Oral BID  ? prednisoLONE acetate  1 drop Left Eye Q2H while awake  ? senna-docusate  1 tablet Oral BID  ? sodium chloride flush  10-40 mL Intracatheter Q12H  ? tamsulosin  0.4 mg Oral Daily  ? traMADol  100 mg Oral QHS  ? traZODone  75 mg Oral QHS  ? ? ? ?Objective: ?Vitals:  ? 07/04/21 0954 07/04/21 1435  ?BP: (!) 145/75 117/69  ?Pulse: (!) 108 98  ?Resp:  20  ?Temp:  97.9 ?F (36.6 ?C)  ?SpO2:  94%  ? ? ?Intake/Output Summary (Last 24 hours) at 07/04/2021 1645 ?Last data filed at 07/04/2021 1400 ?Gross per 24 hour  ?Intake 2252.5 ml  ?Output 0 ml  ?Net 2252.5 ml  ? ? ?Filed Weights  ? 06/16/21 1939 06/28/21 0500 07/02/21 0500  ?Weight: (!) 152.1 kg (!) 148.9 kg (!) 148 kg  ? ?Weight change:  ?Body mass index is 41.89 kg/m?.  ? ?Physical Exam: ? ?Coherent  pleasant obese white male-cannot appreciate JVD/bruits secondary to his thick beard ?Chest is clear no rales no rhonchi no wheeze ?S1-S2 no murmur on telemetry he is sinus so we discontinued this today ?Abdomen is obese nontender no rebound no guarding ?He has trace lower extremity edema ?He is euthymic coherent congruent ?Power is 5/5 ? ? ?Signed, ?Nita Sells, MD ?[facility] ?07/04/2021 ? ? ? ? ? ? ? ? ? ? ? ? ?

## 2021-07-04 NOTE — Progress Notes (Signed)
Checked on patient to put CPAP on patient sitting on side of bed eating PB and crackers ?

## 2021-07-04 NOTE — Progress Notes (Signed)
Pt still not ready to go on cpap. RN aware. Pt said he will let RN know when he is ready. Machine is ready to be worn remain bedside.  ?

## 2021-07-04 NOTE — TOC Progression Note (Signed)
Transition of Care (TOC) - Progression Note  ? ? ?Patient Details  ?Name: Mario [name] ?MRN: [id number] ?Date of Birth: Apr 10, 1950 ? ?Transition of Care (TOC) CM/SW Contact  ?Ross Ludwig, LCSW ?Phone Number: ?07/04/2021, 6:13 PM ? ?Clinical Narrative:    ? ?Insurance auth still pending. ? ?Expected Discharge Plan: [name] ?Barriers to Discharge: Continued Medical Work up ? ?Expected Discharge Plan and Services ?Expected Discharge Plan: [name] ?  ?  ?Post Acute Care Choice: Home Health ?Living arrangements for the past 2 months: [name] ?                ?  ?  ?  ?  ?  ?HH Arranged: PT ?[name] Agency: [name] ?Date HH Agency Contacted: 06/20/21 ?Time [place]: 2458 ?Representative spoke with at [name]: [name] ? ? ?Social Determinants of Health (SDOH) Interventions ?  ? ?Readmission Risk Interventions ? ?  06/07/2021  ?  4:15 PM  ?Readmission Risk Prevention Plan  ?Transportation Screening Complete  ?Medication Review Press photographer) Complete  ?PCP or Specialist appointment within 3-5 days of discharge Complete  ?[place] or Home Care Consult Complete  ?SW Recovery Care/Counseling Consult Complete  ?Palliative Care Screening Not Applicable  ?[place] Not Applicable  ? ? ?

## 2021-07-04 NOTE — Progress Notes (Signed)
Physical Therapy Treatment ?Patient Details ?Name: Mario [name] ?MRN: [id number] ?DOB: 1951-02-02 ?Today's Date: 07/04/2021 ? ? ?History of Present Illness Pt is 71 yo male admitted on 06/16/21 with acute on chronic resp failure - COPD, CAP, sepsis and hx of [place] lung CA stage II s/p radiation. Pt also with new c/o leg paresthesia.  Pt's 4th admission for similar dx in past 6 weeks.  Pt with hx including PE in 2023, OSA on CPAP, HTN, chronic back pain, hx of ankylosing spondylitis, anxiety, and depression, T2DM. ? ?  ?PT Comments  ? ? Patient progressing gradually with mobility but continues to be limited by DOE/SOB with short bouts of gait. VSS throughout with temporary desat to 84% after gait and pt recovered to 91% after 1 minute rest and then 97% after ~2 minutes rest. Pt agreeable to functional LE exercises at EOS despite fatigue, extended rest breaks provided between exercises/sets. Continue to recommend ST rehab at SNF, will progress as able in acute setting. ? ?  ?Recommendations for follow up therapy are one component of a multi-disciplinary discharge planning process, led by the attending physician.  Recommendations may be updated based on patient status, additional functional criteria and insurance authorization. ? ?Follow Up Recommendations ? Skilled nursing-short term rehab (<3 hours/day) ?  ?  ?Assistance Recommended at Discharge PRN  ?Patient can return home with the following A little help with bathing/dressing/bathroom;Help with stairs or ramp for entrance;Assistance with cooking/housework;Assist for transportation ?  ?Equipment Recommendations ? None recommended by PT  ?  ?Recommendations for Other Services   ? ? ?  ?Precautions / Restrictions Precautions ?Precautions: Fall ?Precaution Comments: O2 at home ?Restrictions ?Weight Bearing Restrictions: No  ?  ? ?Mobility ? Bed Mobility ?  ?  ?  ?  ?  ?  ?  ?General bed mobility comments: pt sitting EOB ?  ? ?Transfers ?Overall transfer level: Needs  assistance ?Equipment used: Rolling walker (2 wheels) ?Transfers: Sit to/from Stand ?Sit to Stand: Modified independent (Device/Increase time), Min guard ?  ?  ?  ?  ?  ?General transfer comment: pt safe with sit<>stand from EOB, mod I with use of UE's and extra effort to rise. guarding provided but not needed. ?  ? ?Ambulation/Gait ?Ambulation/Gait assistance: Min guard, Min assist ?Gait Distance (Feet): 45 Feet ?Assistive device: Straight cane ?Gait Pattern/deviations: Decreased stride length, Shuffle, Drifts right/left, Wide base of support ?Gait velocity: decr ?  ?  ?General Gait Details: pt slow and unsteady. pt drifted to Rt requiring min assist to prevent LOB on 2 occasions. c/o SOB  with short bout of gait. HR max of 120 bpm, SpO2 90% during gait and dropped to 84% on 3L once sitting but recovered to 91% after 1 minute seated rest. ? ? ?Stairs ?  ?  ?  ?  ?  ? ? ?Wheelchair Mobility ?  ? ?Modified Rankin (Stroke Patients Only) ?  ? ? ?  ?Balance Overall balance assessment: Needs assistance ?Sitting-balance support: No upper extremity supported ?Sitting balance-Leahy Scale: Good ?  ?  ?Standing balance support: During functional activity, Single extremity supported, Reliant on assistive device for balance ?Standing balance-Leahy Scale: Fair ?  ?  ?  ?  ?  ?  ?  ?  ?  ?  ?  ?  ?  ? ?  ?Cognition Arousal/Alertness: Awake/alert ?Behavior During Therapy: [facility] for tasks assessed/performed ?Overall Cognitive Status: Within Functional Limits for tasks assessed ?  ?  ?  ?  ?  ?  ?  ?  ?  ?  ?  ?  ?  ?  ?  ?  ?  ?  ?  ? ?  ?  Exercises Other Exercises ?Other Exercises: 2x5 reps sit<>stand with bil UE use for power up from recliner ?Other Exercises: 15 reps heel raises with Bil UE support on counter ? ?  ?General Comments   ?  ?  ? ?Pertinent Vitals/Pain Pain Assessment ?Pain Assessment: Faces ?Faces Pain Scale: Hurts a little bit ?Pain Location: chronic back pain ?Pain Descriptors / Indicators: Discomfort, Guarding ?Pain  Intervention(s): Limited activity within patient's tolerance, Monitored during session, Repositioned  ? ? ?Home Living   ?  ?  ?  ?  ?  ?  ?  ?  ?  ?   ?  ?Prior Function    ?  ?  ?   ? ?PT Goals (current goals can now be found in the care plan section) Acute Rehab PT Goals ?Patient Stated Goal: for his breathing to improve ?PT Goal Formulation: With patient ?Time For Goal Achievement: 07/11/21 ?Potential to Achieve Goals: Good ?Progress towards PT goals: Progressing toward goals ? ?  ?Frequency ? ? ? Min 3X/week ? ? ? ?  ?PT Plan Current plan remains appropriate  ? ? ?Co-evaluation   ?  ?  ?  ?  ? ?  ?AM-PAC PT "6 Clicks" Mobility   ?Outcome Measure ? Help needed turning from your back to your side while in a flat bed without using bedrails?: None ?Help needed moving from lying on your back to sitting on the side of a flat bed without using bedrails?: A Little ?Help needed moving to and from a bed to a chair (including a wheelchair)?: A Little ?Help needed standing up from a chair using your arms (e.g., wheelchair or bedside chair)?: A Little ?Help needed to walk in hospital room?: A Little ?Help needed climbing 3-5 steps with a railing? : A Lot ?6 Click Score: 18 ? ?  ?End of Session Equipment Utilized During Treatment: Oxygen ?Activity Tolerance: Patient tolerated treatment well ?Patient left: in bed;with call bell/phone within reach ?Nurse Communication: Mobility status ?PT Visit Diagnosis: Other abnormalities of gait and mobility (R26.89) ?  ? ? ?Time: 8144-8185 ?PT Time Calculation (min) (ACUTE ONLY): 35 min ? ?Charges:  $Gait Training: 8-22 mins ?$Therapeutic Exercise: 8-22 mins          ?          ? ?Gwynneth Albright PT, DPT ?Acute Rehabilitation Services ?Office [phone] ?Pager [phone]  ? ? ?Jacques Navy ?07/04/2021, 4:22 PM ? ?

## 2021-07-05 DIAGNOSIS — J9621 Acute and chronic respiratory failure with hypoxia: Secondary | ICD-10-CM | POA: Diagnosis not present

## 2021-07-05 DIAGNOSIS — J189 Pneumonia, unspecified organism: Secondary | ICD-10-CM | POA: Diagnosis not present

## 2021-07-05 DIAGNOSIS — E871 Hypo-osmolality and hyponatremia: Secondary | ICD-10-CM | POA: Diagnosis not present

## 2021-07-05 DIAGNOSIS — Z86711 Personal history of pulmonary embolism: Secondary | ICD-10-CM | POA: Diagnosis not present

## 2021-07-05 LAB — CBC WITH DIFFERENTIAL/PLATELET
Abs Immature Granulocytes: 0.04 10*3/uL (ref 0.00–0.07)
Basophils Absolute: 0.1 10*3/uL (ref 0.0–0.1)
Basophils Relative: 1 %
Eosinophils Absolute: 0.3 10*3/uL (ref 0.0–0.5)
Eosinophils Relative: 4 %
HCT: 28.4 % — ABNORMAL LOW (ref 39.0–52.0)
Hemoglobin: 9.1 g/dL — ABNORMAL LOW (ref 13.0–17.0)
Immature Granulocytes: 0 %
Lymphocytes Relative: 14 %
Lymphs Abs: 1.2 10*3/uL (ref 0.7–4.0)
MCH: 32.2 pg (ref 26.0–34.0)
MCHC: 32 g/dL (ref 30.0–36.0)
MCV: 100.4 fL — ABNORMAL HIGH (ref 80.0–100.0)
Monocytes Absolute: 1.2 10*3/uL — ABNORMAL HIGH (ref 0.1–1.0)
Monocytes Relative: 13 %
Neutro Abs: 6.2 10*3/uL (ref 1.7–7.7)
Neutrophils Relative %: 68 %
Platelets: 474 10*3/uL — ABNORMAL HIGH (ref 150–400)
RBC: 2.83 MIL/uL — ABNORMAL LOW (ref 4.22–5.81)
RDW: 17.6 % — ABNORMAL HIGH (ref 11.5–15.5)
WBC: 9 10*3/uL (ref 4.0–10.5)
nRBC: 0 % (ref 0.0–0.2)

## 2021-07-05 LAB — GLUCOSE, CAPILLARY
Glucose-Capillary: 125 mg/dL — ABNORMAL HIGH (ref 70–99)
Glucose-Capillary: 141 mg/dL — ABNORMAL HIGH (ref 70–99)
Glucose-Capillary: 157 mg/dL — ABNORMAL HIGH (ref 70–99)
Glucose-Capillary: 148 mg/dL — ABNORMAL HIGH (ref 70–99)

## 2021-07-05 LAB — BPAM RBC
Blood Product Expiration Date: 202306012359
ISSUE DATE / TIME: 202305062326
Unit Type and Rh: 5100

## 2021-07-05 LAB — BASIC METABOLIC PANEL
Anion gap: 6 (ref 5–15)
BUN: 17 mg/dL (ref 8–23)
CO2: 30 mmol/L (ref 22–32)
Calcium: 8.4 mg/dL — ABNORMAL LOW (ref 8.9–10.3)
Chloride: 98 mmol/L (ref 98–111)
Creatinine, Ser: 0.95 mg/dL (ref 0.61–1.24)
GFR, Estimated: >60 mL/min (ref >60)
Glucose, Bld: 187 mg/dL — ABNORMAL HIGH (ref 70–99)
Potassium: 4.5 mmol/L (ref 3.5–5.1)
Sodium: 134 mmol/L — ABNORMAL LOW (ref 135–145)

## 2021-07-05 LAB — TYPE AND SCREEN
ABO/RH(D): O POS
Antibody Screen: NEGATIVE
Unit division: 0

## 2021-07-05 LAB — MAGNESIUM: Magnesium: 2.2 mg/dL (ref 1.7–2.4)

## 2021-07-05 MED ORDER — APIXABAN 5 MG PO TABS
5.0000 mg | ORAL_TABLET | Freq: Two times a day (BID) | ORAL | Status: DC
  Administered 2021-07-05 – 2021-07-06 (×3): 5 mg via ORAL
  Filled 2021-07-05 (×3): qty 1

## 2021-07-05 NOTE — Progress Notes (Signed)
Consult received. Discussed POC with nurse. Patient has had 4 PIV and 2 midlines. Belenda Cruise RN spoke to MD, no PIV needed at this time. Notified Belenda Cruise to notify [facility] with any other needs. VU. Fran Lowes, RN [facility] ?

## 2021-07-05 NOTE — Progress Notes (Signed)
ANTICOAGULATION CONSULT NOTE  ? ?Pharmacy Consult for apixaban  ?Indication: pulmonary embolus ? ?Allergies  ?Allergen Reactions  ? Demerol [Meperidine] Nausea And Vomiting and Other (See Comments)  ?  Made the patient "violently sick"  ? Zocor [Simvastatin] Nausea And Vomiting and Other (See Comments)  ?  Made him very jittery, also  ? ? ?Patient Measurements: ?Height: 6\' 2"  (188 cm) ?Weight: (!) 149.6 kg (329 lb 12.9 oz) ?IBW/kg (Calculated) : 82.2 ?Heparin Dosing Weight:  ? ?Vital Signs: ?Temp: 97.5 ?F (36.4 ?C) (05/08 1225) ?Temp Source: Oral (05/08 1225) ?BP: 154/80 (05/08 1225) ?Pulse Rate: 95 (05/08 1225) ? ?Labs: ?Recent Labs  ?  07/03/21 ?0459 07/04/21 ?7793 07/05/21 ?9030  ?HGB 7.6* 8.7* 9.1*  ?HCT 23.3* 26.9* 28.4*  ?PLT 436* 408* 474*  ?CREATININE 0.79 0.87 0.95  ? ? ?Estimated Creatinine Clearance: 113.4 mL/min (by C-G formula based on SCr of 0.95 mg/dL). ? ? ?Medical History: ?Past Medical History:  ?Diagnosis Date  ? Anxiety   ? Bronchitis   ? COPD (chronic obstructive pulmonary disease) ([place])   ? Depression   ? History of radiation therapy   ? Left lung- 10/05/20-10/15/20- Dr. Gery Pray  ? Hypertension   ? lung ca 09/2020  ? Mental disorder   ? MI (myocardial infarction) ([place])   ? ????  ? OSA (obstructive sleep apnea)   ? Suicide attempt [facility])   ? Tension pneumothorax 06/27/2016  ? Uveitis   ? ? ?Medications:  ?Scheduled:  ? arformoterol  15 mcg Nebulization BID  ? aspirin EC  81 mg Oral Daily  ? buPROPion  300 mg Oral Daily  ? busPIRone  15 mg Oral Daily  ? Chlorhexidine Gluconate Cloth  6 each Topical Daily  ? diltiazem  180 mg Oral Daily  ? DULoxetine  60 mg Oral BID  ? feeding supplement (GLUCERNA SHAKE)  237 mL Oral TID BM  ? fluticasone  2 puff Inhalation BID  ? folic acid  1 mg Oral Daily  ? furosemide  40 mg Intravenous Daily  ? gabapentin  300 mg Oral BID  ? gabapentin  900 mg Oral QHS  ? insulin aspart  0-9 Units Subcutaneous TID WC  ? lidocaine  1 patch Transdermal Q24H  ? lurasidone   40 mg Oral QPC supper  ? melatonin  6 mg Oral QHS  ? methocarbamol  500 mg Oral TID  ? mycophenolate  1,500 mg Oral BID  ? OXcarbazepine  600 mg Oral BID  ? oxcarbazepine  900 mg Oral QHS  ? pantoprazole  40 mg Oral BID  ? polyethylene glycol  17 g Oral BID  ? prednisoLONE acetate  1 drop Left Eye Q2H while awake  ? senna-docusate  1 tablet Oral BID  ? sodium chloride flush  10-40 mL Intracatheter Q12H  ? tamsulosin  0.4 mg Oral Daily  ? traMADol  100 mg Oral QHS  ? traZODone  75 mg Oral QHS  ? ? ?Assessment: ?Pharmacy is consulted to resume pt's home Eliquis after being held on admission. Med initially held due to FOBT positive.  ? ? ?Today, 07/05/21  ?Hgb 9.1 plt 474  ?Scr 0.95 mg/dl, CrCl > 100 ml/min  ?Will sign off from note writing  ? ?Goal of Therapy:  ?Monitor platelets by anticoagulation protocol: Yes ?  ?Plan:  ?Apixaban 5 mg  PO BID  ?Monitor CBC, Scr as available ?Monitor for signs and symptoms of bleeding ? ? ?Royetta Asal, PharmD, BCPS ?07/05/2021 1:44 PM ? ? ? ?

## 2021-07-05 NOTE — Progress Notes (Signed)
?PROGRESS NOTE ? ? ? ?Mario [name]  GYI:[id number] DOB: 08/22/1950 DOA: 06/16/2021 ?PCP: [facility]  ? ?Brief Narrative:  ? 71 y.o. 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  On presentation, chest x-ray showed patchy infiltrate in the left lower lung.  He was started on broad-spectrum antibiotics.  Pulmonary was consulted.  He underwent thoracentesis on 06/23/2021.  Patient completed 7-day course of antibiotics.  He was subsequently found to have drop in hemoglobin with FOBT positive requiring packed red cell transfusion.  GI was consulted.  He underwent EGD on 07/03/2021. ? ?Assessment & Plan: ?  ?Acute blood loss anemia ?Gastric ulcers and esophagitis ?-Hemoglobin had dropped to 6.6 during the hospitalization with positive fecal occult blood. ?-Status post packed cell transfusion.  Status post iron infusion ?-Hemoglobin 9.1 today. ?-Status post EGD on 07/03/2021 which showed LA grade A esophagitis and multiple superficial gastric ulcers.  GI recommends to continue Protonix 40 mg twice a day for 8 weeks and 40 mg once a day for 4 weeks.  GI has cleared to resume anticoagulation after 48 hours of the procedure.  Will resume Eliquis today. ? 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 Julien Nordmann.   ?-Status post thoracentesis on 06/23/2021.  New findings of malignant cells in the pleural fluid.  Dr. Julien Nordmann has been informed by prior hospitalist via secure chat. ?-Repeat chest x-ray on 06/30/2021 showed patchy infiltrate in the left lower lung field, this is most likely cancer.  Holding off on  further antibiotics.  He has had multiple recent antibiotic treatment for recurrent pneumonia.  Patient has completed 7-day course of broad-spectrum antibiotics during this hospitalization; pulmonary has signed off ?-Patient wants to discuss his care further with Dr. Julien Nordmann.  I have communicated the same with Dr. Julien Nordmann via secure chat. ?-Overall prognosis is guarded to poor: Remains full code.  Palliative care evaluated the patient during this hospitalization. ? ?Acute on chronic respiratory failure with hypoxia ?-Currently back to baseline on 3 L oxygen via nasal cannula ? ?Sepsis: Present on admission ?-Resolved ? ?History of pulmonary embolism ?-Eliquis on hold.  Resume Eliquis today. ? ?Acute on chronic systolic CHF ?Essential hypertension ?-Strict input and output.  Daily weights.  Fluid restriction.   ?-Currently on IV Lasix.  We will switch to oral upon discharge.  Lisinopril on hold. ?-Continue Cardizem ? ?Diabetes mellitus type 2 with hyperglycemia ?-Metformin on hold.  Continue CBGs with SSI ? ?Leukocytosis ?-Resolved ? ?Thrombocytosis ?-Possibly reactive ? ?Chronic hyponatremia ?-Sodium level is relatively stable in the 130s range. ? ?Leg paresthesia ?-Continue Neurontin.  Currently on very high doses.  Continue adjustment of dose as an outpatient setting ?-Continue Trileptal.  Outpatient follow-up with neurology ? ?History of vasculitis and ankylosing spondylitis ?-On methotrexate and mycophenolate at home.  Methotrexate on hold.  Resume on discharge ? ?Morbid obesity ?-Outpatient follow-up ? ?Constipation--continue laxatives ? ?Anxiety/depression ?-Continue current regimen.  Outpatient follow-up with psychiatry ? ?OSA ?-Did not tolerate CPAP overnight ? ? ?DVT prophylaxis: Resume Eliquis today ?Code Status: Full ?Family Communication: None at bedside ?Disposition Plan: ?Status is: Inpatient ?Remains inpatient appropriate because: Of need for SNF placement.  Possible discharge to SNF tomorrow if  hemoglobin  remains stable after resumption of Eliquis today. ? ? ? ?Consultants: GI/palliative care/critical care ? ?Procedures: EGD on 07/03/2021 ? ?Antimicrobials: None currently.  Completed antibiotic course ? ? ?Subjective: ?Patient seen and examined at bedside.  Denies any fever, nausea or vomiting.  Complains of back pain. ? ?Objective: ?Vitals:  ? 07/05/21 0603 07/05/21 4818 07/05/21 0950 07/05/21 1225  ?BP: 139/76  136/71 (!) 154/80  ?Pulse: 88   95  ?Resp: 16   20  ?Temp: (!) 97.3 ?F (36.3 ?C)   (!) 97.5 ?F (36.4 ?C)  ?TempSrc: Oral   Oral  ?SpO2: 94% 95%  99%  ?Weight:      ?Height:      ? ? ?Intake/Output Summary (Last 24 hours) at 07/05/2021 1326 ?Last data filed at 07/05/2021 1147 ?Gross per 24 hour  ?Intake 1320 ml  ?Output 2950 ml  ?Net -1630 ml  ? ?Filed Weights  ? 06/28/21 0500 07/02/21 0500 07/05/21 0500  ?Weight: (!) 148.9 kg (!) 148 kg (!) 149.6 kg  ? ? ?Examination: ? ?General exam: Appears calm and comfortable.  Looks chronically ill and deconditioned.  Currently on 4 L oxygen by nasal cannula ?Respiratory system: Bilateral decreased breath sounds at bases with scattered crackles ?Cardiovascular system: S1 & S2 heard, Rate controlled ?Gastrointestinal system: Abdomen is distended, soft and nontender. Normal bowel sounds heard. ?Extremities: No cyanosis, clubbing; trace lower extremity edema present  ?Central nervous system: Awake.  Poor historian.  Slow to respond.  No focal neurological deficits. Moving extremities ?Skin: No rashes, lesions or ulcers ?Psychiatry: Affect is mostly flat.  No signs of agitation. ? ?Data Reviewed: I have personally reviewed following labs and imaging studies ? ?CBC: ?Recent Labs  ?Lab 06/29/21 ?5631 06/30/21 ?0352 06/30/21 ?1130 07/01/21 ?0354 07/02/21 ?0500 07/03/21 ?0459 07/04/21 ?4970 07/05/21 ?2637  ?WBC 11.3* 10.7*  --  9.3 7.8 7.5 7.9 9.0  ?NEUTROABS 7.8* 7.6  --  6.2 4.8  --   --  6.2  ?HGB 6.6* 6.9*   < > 8.1* 7.7* 7.6* 8.7* 9.1*  ?HCT 20.0* 21.2*   < > 25.2*  23.3* 23.3* 26.9* 28.4*  ?MCV 101.0* 99.5  --  97.3 97.9 98.3 97.8 100.4*  ?PLT 434* 410*  --  434* 430* 436* 408* 474*  ? < > = values in this interval not displayed.  ? ?Basic Metabolic Panel: ?Recent Labs  ?Lab 07/01/21 ?0354 07/02/21 ?0500 07/03/21 ?0459 07/04/21 ?8588 07/05/21 ?5027  ?NA 132* 131* 133* 133* 134*  ?K 4.0 4.0 3.8 3.7 4.5  ?CL 93* 94* 96* 96* 98  ?CO2 30 29 29 30 30   ?GLUCOSE 179* 186* 159* 174* 187*  ?BUN 21 19 18 17 17   ?CREATININE 0.74 0.80 0.79 0.87 0.95  ?CALCIUM 8.5* 8.1* 8.3* 8.2* 8.4*  ?MG  --   --   --   --  2.2  ? ?GFR: ?Estimated Creatinine Clearance: 113.4 mL/min (by C-G formula based on SCr of 0.95 mg/dL). ?Liver Function Tests: ?Recent Labs  ?Lab 07/03/21 ?0459 07/04/21 ?7412  ?AST 17 18  ?ALT 22 21  ?ALKPHOS 55 61  ?BILITOT 0.4 0.5  ?PROT 5.4* 5.6*  ?ALBUMIN 2.9* 3.3*  ? ?No results for input(s): LIPASE, AMYLASE in the last 168 hours. ?No results for input(s): AMMONIA in the last 168 hours. ?Coagulation Profile: ?No results for input(s): INR, PROTIME in the last 168 hours. ?Cardiac Enzymes: ?No results for input(s): CKTOTAL, CKMB, CKMBINDEX, TROPONINI in the last 168 hours. ?BNP (last 3 results) ?No results for input(s):  PROBNP in the last 8760 hours. ?HbA1C: ?No results for input(s): HGBA1C in the last 72 hours. ?CBG: ?Recent Labs  ?Lab 07/04/21 ?1127 07/04/21 ?1654 07/04/21 ?2014 07/05/21 ?0724 07/05/21 ?1144  ?GLUCAP 151* 214* 167* 125* 141*  ? ?Lipid Profile: ?No results for input(s): CHOL, HDL, LDLCALC, TRIG, CHOLHDL, LDLDIRECT in the last 72 hours. ?Thyroid Function Tests: ?No results for input(s): TSH, T4TOTAL, FREET4, T3FREE, THYROIDAB in the last 72 hours. ?Anemia Panel: ?No results for input(s): VITAMINB12, FOLATE, FERRITIN, TIBC, IRON, RETICCTPCT in the last 72 hours. ?Sepsis Labs: ?No results for input(s): PROCALCITON, LATICACIDVEN in the last 168 hours. ? ?No results found for this or any previous visit (from the past 240 hour(s)).  ? ? ? ? ? ?Radiology Studies: ?DG  Chest 2 View ? ?Result Date: 07/04/2021 ?CLINICAL DATA:  Pleural effusion. EXAM: CHEST - 2 VIEW COMPARISON:  Radiograph 06/30/2021, CT 06/23/2021 FINDINGS: Stable radiographic size and appearance of left pleural ef

## 2021-07-05 NOTE — TOC Progression Note (Addendum)
Transition of Care (TOC) - Progression Note  ? ? ?Patient Details  ?Name: Mario [name] ?MRN: [id number] ?Date of Birth: 11/06/1950 ? ?Transition of Care (TOC) CM/SW Contact  ?Ross Ludwig, LCSW ?Phone Number: ?07/05/2021, 1:26 PM ? ?Clinical Narrative:    ? ?CSW received phone call from [place] at [place], peer to peer does not need to be completed now.  Based on clinical information from yesterday.  Patient has been approved for SNF placement at [facility].  Authorization number [id number], next review Jul 07, 2021.  CSW updated patient, bedside nurse and attending physician.  CSW explained to patient that he has been approved through Wednesday, and it will depend on how he is doing when the physician decides to discharge.   ? ?Per patient he said he thinks tomorrow is too soon for discharge, CSW informed him it will be up to the physician to discuss and decide with him.  ? ? ?Expected Discharge Plan: [place] ?Barriers to Discharge: Continued Medical Work up ? ?Expected Discharge Plan and Services ?Expected Discharge Plan: [place] ?  ?  ?Post Acute Care Choice: Home Health ?Living arrangements for the past 2 months: [place] ?                ?  ?  ?  ?  ?  ?HH Arranged: PT ?[place] Agency: [place] ?Date HH Agency Contacted: 06/20/21 ?Time [place]: 2233 ?Representative spoke with at [place]: [name] ? ? ?Social Determinants of Health (SDOH) Interventions ?  ? ?Readmission Risk Interventions ? ?  06/07/2021  ?  4:15 PM  ?Readmission Risk Prevention Plan  ?Transportation Screening Complete  ?Medication Review Press photographer) Complete  ?PCP or Specialist appointment within 3-5 days of discharge Complete  ?[place] or Home Care Consult Complete  ?SW Recovery Care/Counseling Consult Complete  ?Palliative Care Screening Not Applicable  ?[place] Not Applicable  ? ? ?

## 2021-07-06 ENCOUNTER — Inpatient Hospital Stay: Payer: No Typology Code available for payment source | Admitting: Internal Medicine

## 2021-07-06 ENCOUNTER — Inpatient Hospital Stay: Payer: No Typology Code available for payment source

## 2021-07-06 DIAGNOSIS — J189 Pneumonia, unspecified organism: Secondary | ICD-10-CM | POA: Diagnosis not present

## 2021-07-06 DIAGNOSIS — Z86711 Personal history of pulmonary embolism: Secondary | ICD-10-CM | POA: Diagnosis not present

## 2021-07-06 DIAGNOSIS — J9621 Acute and chronic respiratory failure with hypoxia: Secondary | ICD-10-CM | POA: Diagnosis not present

## 2021-07-06 DIAGNOSIS — E871 Hypo-osmolality and hyponatremia: Secondary | ICD-10-CM | POA: Diagnosis not present

## 2021-07-06 LAB — BASIC METABOLIC PANEL
Anion gap: 7 (ref 5–15)
BUN: 15 mg/dL (ref 8–23)
CO2: 29 mmol/L (ref 22–32)
Calcium: 8.4 mg/dL — ABNORMAL LOW (ref 8.9–10.3)
Chloride: 98 mmol/L (ref 98–111)
Creatinine, Ser: 0.95 mg/dL (ref 0.61–1.24)
GFR, Estimated: >60 mL/min (ref >60)
Glucose, Bld: 191 mg/dL — ABNORMAL HIGH (ref 70–99)
Potassium: 4 mmol/L (ref 3.5–5.1)
Sodium: 134 mmol/L — ABNORMAL LOW (ref 135–145)

## 2021-07-06 LAB — CBC WITH DIFFERENTIAL/PLATELET
Abs Immature Granulocytes: 0.05 10*3/uL (ref 0.00–0.07)
Basophils Absolute: 0.1 10*3/uL (ref 0.0–0.1)
Basophils Relative: 1 %
Eosinophils Absolute: 0.4 10*3/uL (ref 0.0–0.5)
Eosinophils Relative: 3 %
HCT: 27.6 % — ABNORMAL LOW (ref 39.0–52.0)
Hemoglobin: 8.7 g/dL — ABNORMAL LOW (ref 13.0–17.0)
Immature Granulocytes: 1 %
Lymphocytes Relative: 10 %
Lymphs Abs: 1.1 10*3/uL (ref 0.7–4.0)
MCH: 31.6 pg (ref 26.0–34.0)
MCHC: 31.5 g/dL (ref 30.0–36.0)
MCV: 100.4 fL — ABNORMAL HIGH (ref 80.0–100.0)
Monocytes Absolute: 1.4 10*3/uL — ABNORMAL HIGH (ref 0.1–1.0)
Monocytes Relative: 13 %
Neutro Abs: 7.8 10*3/uL — ABNORMAL HIGH (ref 1.7–7.7)
Neutrophils Relative %: 72 %
Platelets: 449 10*3/uL — ABNORMAL HIGH (ref 150–400)
RBC: 2.75 MIL/uL — ABNORMAL LOW (ref 4.22–5.81)
RDW: 17.4 % — ABNORMAL HIGH (ref 11.5–15.5)
WBC: 10.7 10*3/uL — ABNORMAL HIGH (ref 4.0–10.5)
nRBC: 0 % (ref 0.0–0.2)

## 2021-07-06 LAB — GLUCOSE, CAPILLARY
Glucose-Capillary: 157 mg/dL — ABNORMAL HIGH (ref 70–99)
Glucose-Capillary: 140 mg/dL — ABNORMAL HIGH (ref 70–99)

## 2021-07-06 LAB — MAGNESIUM: Magnesium: 2.1 mg/dL (ref 1.7–2.4)

## 2021-07-06 MED ORDER — SENNOSIDES-DOCUSATE SODIUM 8.6-50 MG PO TABS: 1.0000 | ORAL_TABLET | Freq: Two times a day (BID) | ORAL | 0 refills | Status: DC

## 2021-07-06 MED ORDER — CLOTRIMAZOLE-BETAMETHASONE 1-0.05 % EX CREA: 1.0000 "application " | TOPICAL_CREAM | Freq: Two times a day (BID) | CUTANEOUS | Status: DC | PRN

## 2021-07-06 MED ORDER — FUROSEMIDE 40 MG PO TABS: 40.0000 mg | ORAL_TABLET | Freq: Every day | ORAL | 0 refills | Status: DC

## 2021-07-06 MED ORDER — LIDOCAINE 5 % EX PTCH: 1.0000 | MEDICATED_PATCH | CUTANEOUS | 0 refills | Status: DC

## 2021-07-06 MED ORDER — GUAIFENESIN ER 600 MG PO TB12: 600.0000 mg | ORAL_TABLET | Freq: Two times a day (BID) | ORAL | Status: DC | PRN

## 2021-07-06 MED ORDER — GABAPENTIN 300 MG PO CAPS: 300.0000 mg | ORAL_CAPSULE | Freq: Two times a day (BID) | ORAL | 0 refills | Status: DC

## 2021-07-06 MED ORDER — PANTOPRAZOLE SODIUM 40 MG PO TBEC
40.0000 mg | DELAYED_RELEASE_TABLET | Freq: Two times a day (BID) | ORAL | 0 refills | Status: DC
Start: 2021-07-06 — End: 2021-08-07

## 2021-07-06 MED ORDER — OXYCODONE-ACETAMINOPHEN 5-325 MG PO TABS: 1.0000 | ORAL_TABLET | Freq: Four times a day (QID) | ORAL | 0 refills | Status: DC | PRN

## 2021-07-06 MED ORDER — TRAMADOL HCL 50 MG PO TABS: 100.0000 mg | ORAL_TABLET | Freq: Every day | ORAL | 0 refills | Status: DC

## 2021-07-06 MED ORDER — METHOCARBAMOL 500 MG PO TABS: 500.0000 mg | ORAL_TABLET | Freq: Three times a day (TID) | ORAL | 0 refills | Status: DC | PRN

## 2021-07-06 NOTE — Plan of Care (Signed)
Patient being Dc'd to SNF. Report called at 1130 to [place]. Patient prepped for DC and belongings sent with patient. Patient transferred to facility via EMS.  ? ? ?Problem: Education: ?Goal: Knowledge of General Education information will improve ?Description: Including pain rating scale, medication(s)/side effects and non-pharmacologic comfort measures ?Outcome: Completed/Met ?  ?Problem: Health Behavior/Discharge Planning: ?Goal: Ability to manage health-related needs will improve ?Outcome: Completed/Met ?  ?Problem: Clinical Measurements: ?Goal: Ability to maintain clinical measurements within normal limits will improve ?Outcome: Completed/Met ?Goal: Will remain free from infection ?Outcome: Completed/Met ?Goal: Diagnostic test results will improve ?Outcome: Completed/Met ?Goal: Respiratory complications will improve ?Outcome: Completed/Met ?Goal: Cardiovascular complication will be avoided ?Outcome: Completed/Met ?  ?Problem: Activity: ?Goal: Risk for activity intolerance will decrease ?Outcome: Completed/Met ?  ?Problem: Nutrition: ?Goal: Adequate nutrition will be maintained ?Outcome: Completed/Met ?  ?Problem: Coping: ?Goal: Level of anxiety will decrease ?Outcome: Completed/Met ?  ?Problem: Elimination: ?Goal: Will not experience complications related to bowel motility ?Outcome: Completed/Met ?Goal: Will not experience complications related to urinary retention ?Outcome: Completed/Met ?  ?Problem: Pain Managment: ?Goal: General experience of comfort will improve ?Outcome: Completed/Met ?  ?Problem: Safety: ?Goal: Ability to remain free from injury will improve ?Outcome: Completed/Met ?  ?Problem: Skin Integrity: ?Goal: Risk for impaired skin integrity will decrease ?Outcome: Completed/Met ?  ?Problem: Education: ?Goal: Knowledge of General Education information will improve ?Description: Including pain rating scale, medication(s)/side effects and non-pharmacologic comfort measures ?Outcome:  Completed/Met ?  ?Problem: Health Behavior/Discharge Planning: ?Goal: Ability to manage health-related needs will improve ?Outcome: Completed/Met ?  ?Problem: Clinical Measurements: ?Goal: Ability to maintain clinical measurements within normal limits will improve ?Outcome: Completed/Met ?Goal: Will remain free from infection ?Outcome: Completed/Met ?Goal: Diagnostic test results will improve ?Outcome: Completed/Met ?Goal: Respiratory complications will improve ?Outcome: Completed/Met ?Goal: Cardiovascular complication will be avoided ?Outcome: Completed/Met ?  ?Problem: Activity: ?Goal: Risk for activity intolerance will decrease ?Outcome: Completed/Met ?  ?Problem: Nutrition: ?Goal: Adequate nutrition will be maintained ?Outcome: Completed/Met ?  ?Problem: Coping: ?Goal: Level of anxiety will decrease ?Outcome: Completed/Met ?  ?Problem: Elimination: ?Goal: Will not experience complications related to bowel motility ?Outcome: Completed/Met ?Goal: Will not experience complications related to urinary retention ?Outcome: Completed/Met ?  ?Problem: Pain Managment: ?Goal: General experience of comfort will improve ?Outcome: Completed/Met ?  ?Problem: Safety: ?Goal: Ability to remain free from injury will improve ?Outcome: Completed/Met ?  ?Problem: Skin Integrity: ?Goal: Risk for impaired skin integrity will decrease ?Outcome: Completed/Met ?  ?Problem: Activity: ?Goal: Ability to tolerate increased activity will improve ?Outcome: Completed/Met ?  ?Problem: Clinical Measurements: ?Goal: Ability to maintain a body temperature in the normal range will improve ?Outcome: Completed/Met ?  ?Problem: Respiratory: ?Goal: Ability to maintain adequate ventilation will improve ?Outcome: Completed/Met ?Goal: Ability to maintain a clear airway will improve ?Outcome: Completed/Met ?  ?

## 2021-07-06 NOTE — TOC Transition Note (Signed)
Transition of Care (TOC) - CM/SW Discharge Note ? ? ?Patient Details  ?Name: Mario [name] ?MRN: [id number] ?Date of Birth: 1951-01-30 ? ?Transition of Care (TOC) CM/SW Contact:  ?Ross Ludwig, LCSW ?Phone Number: ?07/06/2021, 10:32 AM ? ? ?Clinical Narrative:    ?CSW spoke to [facility], they can accept patient today.  CSW notified attending physician and bedside nurse.  Patient to be d/c'ed today to [facility] room 101B.  Patient and family agreeable to plans will transport via ems RN to call report [phone]. ? ? ? ? ?Final next level of care: [place] ?Barriers to Discharge: Barriers Resolved ? ? ?Patient Goals and CMS Choice ?Patient states their goals for this hospitalization and ongoing recovery are:: To go to SNF then return back home. ?CMS Medicare.gov Compare Post Acute Care list provided to:: Patient ?Choice offered to / list presented to : Patient ? ?Discharge Placement ?PASRR number recieved: 07/02/21 ?           ?Patient [facility] bed at: [facility] ?Patient to be transferred to facility by: [facility] ?  ?Patient and family notified of of transfer: 07/06/21 ? ?Discharge Plan and Services ?  ?  ?Post Acute Care Choice: Home Health          ?  ?  ?  ?  ?  ?HH Arranged: PT ?[place] Agency: [place] ?Date HH Agency Contacted: 06/20/21 ?Time [place]: 2111 ?Representative spoke with at [place]: [name] ? ?Social Determinants of Health (SDOH) Interventions ?  ? ? ?Readmission Risk Interventions ? ?  06/07/2021  ?  4:15 PM  ?Readmission Risk Prevention Plan  ?Transportation Screening Complete  ?Medication Review Press photographer) Complete  ?PCP or Specialist appointment within 3-5 days of discharge Complete  ?[place] or Home Care Consult Complete  ?SW Recovery Care/Counseling Consult Complete  ?Palliative Care Screening Not Applicable  ?[place] Not Applicable  ? ? ? ? ? ?

## 2021-07-06 NOTE — Discharge Summary (Signed)
Physician Discharge Summary  [name] NTI:[id number] DOB: May 08, 1950 DOA: 06/16/2021 ? ?PCP: [facility] ? ?Admit date: 06/16/2021 ?Discharge date: 07/06/2021 ? ?Admitted From: Home ?Disposition: SNF ? ?Recommendations for Outpatient Follow-up:  ?Follow up with SNF provider at earliest convenience with repeat CBC/BMP ?Outpatient follow-up with GI  ?Outpatient follow-up with oncology  ?Recommend outpatient evaluation and follow-up by palliative care  ?Outpatient follow-up with cardiology  ?follow up in ED if symptoms worsen or new appear ? ? ?Home Health: No ?Equipment/Devices: Continue supplemental oxygen via nasal cannula; normally at 3 L/min ?Discharge Condition: Guarded to poor ?CODE STATUS: Full ?Diet recommendation: Heart healthy/carb modified/fluid restriction of up to 1200 cc a day ? ?Brief/Interim Summary: ? 71 y.o. 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  On presentation, chest x-ray showed patchy infiltrate in the left lower lung.  He was started on broad-spectrum antibiotics.  Pulmonary was consulted.  He underwent thoracentesis on 06/23/2021.  Patient completed 7-day course of antibiotics.  He was subsequently found to have drop in hemoglobin with FOBT positive requiring packed red cell transfusion.  GI was consulted.  He underwent EGD on 07/03/2021.  Subsequently, his hemoglobin has remained stable.  Eliquis was resumed on 07/05/2021.  He will be discharged to SNF today if bed is available. ?  ? ?Discharge Diagnoses:  ? ?Acute blood loss anemia ?Gastric ulcers and esophagitis ?-Hemoglobin had dropped to 6.6 during the hospitalization with positive fecal occult blood. ?-Status post  packed cell transfusion.  Status post iron infusion ?-Hemoglobin 8.7 today. ?-Status post EGD on 07/03/2021 which showed LA grade A esophagitis and multiple superficial gastric ulcers.  GI recommends to continue Protonix 40 mg twice a day for 8 weeks and 40 mg once a day for 4 weeks.  GI has cleared to resume anticoagulation after 48 hours of the procedure.  Resumed Eliquis on 07/05/2021: Dr. Julien Nordmann also had given clearance to resume Eliquis via secure chat on 07/05/2021.  Outpatient follow-up with GI. ? 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 Julien Nordmann.   ?-Status post thoracentesis on 06/23/2021.  New findings of malignant cells in the pleural fluid.  Dr. Julien Nordmann has been informed by prior hospitalist via secure chat. ?-Repeat chest x-ray on 06/30/2021 showed patchy infiltrate in the left lower lung field, this is most likely cancer.  Holding off on further antibiotics.  He has had multiple recent antibiotic treatment for recurrent pneumonia.  Patient has completed 7-day course of broad-spectrum antibiotics during this hospitalization; pulmonary has signed off ?-Patient wanted to discuss his care further with Dr. Julien Nordmann.  I had communicated the same with Dr. Julien Nordmann via secure chat on 07/05/2021.  Patient can follow-up with Dr. Julien Nordmann as an outpatient. ?-Overall prognosis is guarded to poor: Remains full code.  Palliative care evaluated the patient during this hospitalization.  Patient wants to discuss with Dr. Julien Nordmann before making further decisions. ?  ?Acute on chronic respiratory failure with hypoxia ?-Currently back to baseline on 3 L oxygen via nasal cannula ? ?Probable left arm superficial thrombophlebitis ?-Left arm slightly swollen, erythematous and mildly tender: Possibly from superficial thrombophlebitis.  IV line removed yesterday.  Recommend limb elevation and ice/warm compresses. ?  ?Sepsis:  Present on admission ?-Resolved ?  ?History of pulmonary embolism ?-Eliquis resumed on 07/05/2021. ? ?Acute on chronic systolic CHF ?Essential hypertension ?-Strict input and output.  Daily weights.  Fluid restriction.   ?-Currently on IV Lasix.  We will switch to oral Lasix 40 mg daily upon discharge.  Resume lisinopril. ?-Continue Cardizem.  Outpatient follow-up with cardiology ?  ?Diabetes mellitus type 2 with hyperglycemia ?-Metformin will be resumed on discharge.  Carb modified diet.  Outpatient follow-up.Leukocytosis ?-Resolved ? ?Thrombocytosis ?-Possibly reactive ? ?Chronic hyponatremia ?-Sodium level is relatively stable in the 130s range.  Outpatient follow-up ?  ?Leg paresthesia ?-Continue Neurontin.  Currently on very high doses.  Continue adjustment of dose as an outpatient setting ?-Continue Trileptal.  Outpatient follow-up with neurology ?  ?History of vasculitis and ankylosing spondylitis ?-On methotrexate and mycophenolate at home.  Methotrexate on hold.  Resume on discharge ? ?Morbid obesity ?-Outpatient follow-up ? ?Constipation--continue laxatives ?  ?Anxiety/depression ?-Continue current regimen.  Outpatient follow-up with psychiatry ? ?OSA ?-Did not tolerate CPAP in the hospital ?Discharge Instructions ? ?Discharge Instructions   ? ? Amb Referral to Palliative Care   Complete by: As directed ?  ? Ambulatory referral to Cardiology   Complete by: As directed ?  ? Diet - low sodium heart healthy   Complete by: As directed ?  ? Diet Carb Modified   Complete by: As directed ?  ? Increase activity slowly   Complete by: As directed ?  ? ?  ? ?Allergies as of 07/06/2021   ? ?   Reactions  ? Demerol [meperidine] Nausea And Vomiting, Other (See Comments)  ? Made the patient "violently sick"  ? Zocor [simvastatin] Nausea And Vomiting, Other (See Comments)  ? Made him very jittery, also  ? ?  ? ?  ?Medication List  ?  ? ?STOP taking these medications   ? ?bisacodyl 5 MG EC tablet ?Commonly known as: DULCOLAX ?   ?cyclobenzaprine 10 MG tablet ?Commonly known as: FLEXERIL ?  ?docusate sodium 100 MG capsule ?Commonly known as: COLACE ?  ? ?  ? ?TAKE these medications   ? ?albuterol 108 (90 Base) MCG/ACT inhaler ?Commonly known as: VENTOLIN HFA ?Inhale 2 puffs into the lungs every 6 (six) hours as needed for wheezing. ?What changed: Another medication with the same name was changed. Make sure you understand how and when to take each. ?  ?albuterol (2.5 MG/3ML) 0.083% nebulizer solution ?Commonly known as: PROVENTIL ?Inhale 3 mLs (2.5 mg total) by nebulization every 4 (four) hours as needed for wheezing or shortness of breath. ?What changed: when to take this ?  ?apixaban 5 MG Tabs tablet ?Commonly known as: ELIQUIS ?Take 5 mg by mouth 2 (two) times daily. ?  ?aspirin 81 MG EC tablet ?Take 81 mg by mouth daily. ?  ?b complex vitamins capsule ?Take 1 capsule by mouth daily. ?  ?buPROPion 300 MG 24 hr tablet ?Commonly known as: WELLBUTRIN XL ?Take 300 mg by mouth daily. ?  ?busPIRone 15 MG tablet ?Commonly known as: BUSPAR ?Take 15 mg by mouth daily. ?  ?calcium-vitamin D 500-200 MG-UNIT tablet ?Commonly known as: OSCAL WITH D ?Take 1 tablet by mouth 2 (two) times daily with a meal. ?  ?carboxymethylcellulose 0.5 % Soln ?Commonly known as: REFRESH PLUS ?Place 1 drop into both eyes 4 (four) times daily as needed (dry eyes). ?  ?clobetasol ointment 0.05 % ?Commonly known as: TEMOVATE ?Apply 1 application. topically 2 (two) times daily as needed (for scars). ?  ?  clotrimazole-betamethasone cream ?Commonly known as: LOTRISONE ?Apply 1 application. topically 2 (two) times daily as needed (fungus). ?  ?cycloSPORINE 0.05 % ophthalmic emulsion ?Commonly known as: RESTASIS ?Place 1 drop into both eyes 2 (two) times daily. ?  ?diltiazem 180 MG 24 hr capsule ?Commonly known as: CARDIZEM CD ?Take 180 mg by mouth daily. ?  ?DULoxetine 60 MG capsule ?Commonly known as: CYMBALTA ?Take 1 capsule (60 mg total) by mouth 2 (two) times daily. ?   ?feeding supplement Liqd ?Take 237 mLs by mouth 3 (three) times daily between meals. ?What changed: when to take this ?  ?fluticasone 50 MCG/ACT nasal spray ?Commonly known as: FLONASE ?Place 2 sprays into both

## 2021-07-07 LAB — SURGICAL PATHOLOGY

## 2021-07-08 ENCOUNTER — Telehealth: Payer: Self-pay

## 2021-07-08 NOTE — Telephone Encounter (Signed)
Pt called stating he wants to see Dr. Julien Nordmann regarding his most recent hospitalization. He states he has over 1L of fluid drained and was told it was malignant and he was now terminal. He states he is at a rehab facility at this time. ? ?I reviewed pts discharge summary from Dr. Starla Link which indicates he consulted with Dr. Julien Nordmann who wants to see the pt on an outpt basis to discuss his concerns in detail. I have advised the pt of this and advised that his appt was moved up to 07/20/21 to allow an opportunity to complete rehab. Pt expressed understanding of this information. ?

## 2021-07-14 ENCOUNTER — Inpatient Hospital Stay: Payer: No Typology Code available for payment source

## 2021-07-14 ENCOUNTER — Telehealth: Payer: Self-pay

## 2021-07-14 NOTE — Telephone Encounter (Signed)
Pt called wanting to know when his next appt is. I have advised the pt his arrival time on 07/20/21 is 8:15a. Pt expressed understanding of this information. ?

## 2021-07-16 ENCOUNTER — Non-Acute Institutional Stay: Payer: Medicare PPO | Admitting: Hospice

## 2021-07-16 DIAGNOSIS — J449 Chronic obstructive pulmonary disease, unspecified: Secondary | ICD-10-CM

## 2021-07-16 DIAGNOSIS — C3492 Malignant neoplasm of unspecified part of left bronchus or lung: Secondary | ICD-10-CM

## 2021-07-16 DIAGNOSIS — Z515 Encounter for palliative care: Secondary | ICD-10-CM

## 2021-07-16 DIAGNOSIS — J9621 Acute and chronic respiratory failure with hypoxia: Secondary | ICD-10-CM

## 2021-07-16 DIAGNOSIS — R531 Weakness: Secondary | ICD-10-CM

## 2021-07-16 NOTE — Progress Notes (Signed)
[facility] Consult Note Telephone: [phone]  Fax: [phone]  PATIENT NAME: Mario [name] [id number] Mario [name] [place] [address] [phone] (home)  DOB: 1950/07/25 MRN: [id number]  PRIMARY CARE PROVIDER:    Clinic, [place],  [place] [address] [phone]  REFERRING PROVIDER:   Martinique Miller, NP RESPONSIBLE PARTY:   Self Contact Information     Name Relation Home Work [place] Friend [phone]  [phone]   [place] Daughter [phone]  [phone]   Mario [name] [phone]  [phone]        I met face to face with patient at facility. Visit to build trust and highlight Palliative Medicine as specialized medical care for people living with serious illness, aimed at facilitating better quality of life through symptoms relief, assisting with advance care planning and complex medical decision making.  Patient endorsed palliative service; he would want to continue to have palliative service after his discharge to home. ASSESSMENT AND / RECOMMENDATIONS:   Advance Care Planning: Our advance care planning conversation included a discussion about:    The value and importance of advance care planning  Difference between Hospice and Palliative care Exploration of goals of care in the event of a sudden injury or illness  Identification and preparation of a healthcare agent  Review and updating or creation of an  advance directive document . Decision not to resuscitate or to de-escalate disease focused treatments due to poor prognosis.  CODE STATUS: Discussion on ramifications and implications of CODE STATUS.  Patient affirmed he is a full code.  Goals of Care: Goals include to maximize quality of life and symptom management   I spent 16  minutes providing this initial consultation. More than 50% of the time in this consultation was  spent on counseling patient and coordinating communication. --------------------------------------------------------------------------------------------------------------------------------------  Symptom Management/Plan: Acute on chronic Resiratory failure with hypoxia: Recent hospitalization 06/16/2021 to 07/06/2021 for healthcare associated pneumonia.  Antibiotics completed.  Continue oxygen supplementation 3 L/min via nasal cannula. Follow-up with PCP as planned.  Routine CBC BMP. Lung Cancer: Completed radiation.  No chemotherapy at this time.  Appointment with Oncology next week. Weakness: PT/OT is ongoing for strengthening and gait training COPD: Avoid triggers, slow deep breathing; continue oxygen supplementation and albuterol/breathing treatments as ordered.  Follow up: Palliative care will continue to follow for complex medical decision making, advance care planning, and clarification of goals. Return 6 weeks or prn. Encouraged to call provider sooner with any concerns.   Family /Caregiver/Community Supports: Patient in SNF for ongoing care.  Patient likely to be discharged home later today or tomorrow.  HOSPICE ELIGIBILITY/DIAGNOSIS: TBD  Chief Complaint: Initial Palliative care visit  HISTORY OF PRESENT ILLNESS:  Mario [name] is a 71 y.o. year old male  with multiple morbidities requiring close monitoring and with high risk of complications and  mortality: Lung cancer, acute on chronic respiratory failure with hypoxia, COPD, hypertension, systolic congestive heart failure, type 2 diabetes mellitus.  History obtained from review of EMR, discussion with primary team, caregiver, family and/or Mario [name].  Review and summarization of Epic records shows history from other than patient. Rest of 10 point ROS asked and negative. Independent interpretation of tests and reviewed as needed, available labs, patient records, imaging, studies and related documents from the EMR.  Physical  Exam: Constitutional: NAD General: Well groomed, cooperative EYES: anicteric sclera, lids intact, no discharge  ENMT: Moist mucous membrane CV: S1  S2, RRR, no LE edema Pulmonary: Diminished in the bases, no increased work of breathing, no cough, oxygen supplementation 3 L/min Abdomen: active BS + 4 quadrants, soft and non tender GU: no suprapubic tenderness MSK: weakness,  limited ROM Skin: warm and dry, no rashes or wounds on visible skin Neuro:  weakness, otherwise non focal Psych: non-anxious affect Hem/lymph/immuno: no widespread bruising   PAST MEDICAL HISTORY:  Active Ambulatory Problems    Diagnosis Date Noted   Chest pain 01/10/2013   Hypertension associated with diabetes ([place]) 01/10/2013   Depression 01/11/2013   Tobacco abuse 01/11/2013   Obesity, unspecified 01/11/2013   Malingering 01/21/2013   Dyspnea 05/23/2014   COPD exacerbation ([place]) 05/23/2014   Spontaneous pneumothorax 06/27/2016   Hypoxia    Pneumothorax on left    Acute chest pain    Hyperkalemia    Cellulitis of both feet 07/16/2017   DM2 (diabetes mellitus, type 2) ([place]) 07/16/2017   HLA B27 (HLA B27 positive) 07/16/2017   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   Major depressive disorder, recurrent episode ([place]) 10/12/2018   Hyperglycemia 06/23/2019   OSA (obstructive sleep apnea) 06/23/2019   Suicidal ideation 06/23/2019   COPD (chronic obstructive pulmonary disease) ([facility])    AKI (acute kidney injury) ([place])    Vertigo 12/26/2019   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   Stage 3a chronic kidney disease ([place]) 12/26/2019   Abnormal CT of the chest 12/26/2019   COPD with acute exacerbation ([employer]) 01/05/2020   HCAP (healthcare-associated pneumonia) 04/03/2020   Constipation    Acute respiratory failure with hypoxemia ([place]) 07/26/2020   Lung nodule 07/30/2020   Mass of left lung    Non-small cell cancer of left lung ([place]) 09/21/2020   Acute on chronic respiratory  failure ([place]) 01/09/2021   Acute pulmonary embolism without acute cor pulmonale ([place]) 02/03/2021   Mood disorder ([place]) 02/03/2021   Hyponatremia 05/20/2021   Acute hypoxemic respiratory failure ([place]) 05/21/2021   Acute respiratory failure with hypoxia ([place]) 06/04/2021   Hypertension    Allergic rhinitis    History of pulmonary embolism    Acute on chronic respiratory failure with hypoxia ([place]) 06/04/2021   Leg paresthesia 06/17/2021   Sepsis ([place]) 06/17/2021   Obesity, Class III, BMI 40-49.9 (morbid obesity) ([place]) 06/17/2021   Pneumonia 06/17/2021   Resolved Ambulatory Problems    Diagnosis Date Noted   Old MI (myocardial infarction) 01/11/2013   MDD (major depressive disorder), recurrent severe, without psychosis ([place]) 01/13/2013   Past Medical History:  Diagnosis Date   Anxiety    Bronchitis    History of radiation therapy    lung ca 09/2020   Mental disorder    MI (myocardial infarction) ([place])    Suicide attempt ([place])    Tension pneumothorax 06/27/2016   Uveitis     SOCIAL HX:  Social History   Tobacco Use   Smoking status: Former    Packs/day: 1.00    Years: 35.00    Pack years: 35.00    Types: Cigarettes    Quit date: 05/2016    Years since quitting: 5.1   Smokeless tobacco: Never  Substance Use Topics   Alcohol use: No    Alcohol/week: 0.0 standard drinks    Comment: denies use of any drugs or alcohol     FAMILY HX:  Family History  Problem Relation Age of Onset   Dementia Father       ALLERGIES:  Allergies  Allergen Reactions  Demerol [Meperidine] Nausea And Vomiting and Other (See Comments)    Made the patient "violently sick"   Zocor [Simvastatin] Nausea And Vomiting and Other (See Comments)    Made him very jittery, also      PERTINENT MEDICATIONS:  Outpatient Encounter Medications as of 07/16/2021  Medication Sig   albuterol (PROVENTIL) (2.5 MG/3ML) 0.083% nebulizer solution Inhale 3 mLs (2.5 mg total) by nebulization every 4 (four)  hours as needed for wheezing or shortness of breath. (Patient taking differently: Take 2.5 mg by nebulization every 6 (six) hours as needed for wheezing or shortness of breath.)   albuterol (VENTOLIN HFA) 108 (90 Base) MCG/ACT inhaler Inhale 2 puffs into the lungs every 6 (six) hours as needed for wheezing.   apixaban (ELIQUIS) 5 MG TABS tablet Take 5 mg by mouth 2 (two) times daily.   aspirin 81 MG EC tablet Take 81 mg by mouth daily.   b complex vitamins capsule Take 1 capsule by mouth daily.   buPROPion (WELLBUTRIN XL) 300 MG 24 hr tablet Take 300 mg by mouth daily.   busPIRone (BUSPAR) 15 MG tablet Take 15 mg by mouth daily.   calcium-vitamin D (OSCAL WITH D) 500-200 MG-UNIT tablet Take 1 tablet by mouth 2 (two) times daily with a meal.   carboxymethylcellulose (REFRESH PLUS) 0.5 % SOLN Place 1 drop into both eyes 4 (four) times daily as needed (dry eyes).   Cholecalciferol (VITAMIN D-3) 25 MCG (1000 UT) CAPS Take 1,000 Units by mouth 2 (two) times daily.   clobetasol ointment (TEMOVATE) 1.61 % Apply 1 application. topically 2 (two) times daily as needed (for scars).   clotrimazole-betamethasone (LOTRISONE) cream Apply 1 application. topically 2 (two) times daily as needed (fungus).   cycloSPORINE (RESTASIS) 0.05 % ophthalmic emulsion Place 1 drop into both eyes 2 (two) times daily.   diltiazem (CARDIZEM CD) 180 MG 24 hr capsule Take 180 mg by mouth daily.   DULoxetine (CYMBALTA) 60 MG capsule Take 1 capsule (60 mg total) by mouth 2 (two) times daily.   feeding supplement (ENSURE ENLIVE / ENSURE PLUS) LIQD Take 237 mLs by mouth 3 (three) times daily between meals. (Patient taking differently: Take 1 Bottle by mouth daily.)   fluticasone (FLONASE) 50 MCG/ACT nasal spray Place 2 sprays into both nostrils daily.    folic acid (FOLVITE) 1 MG tablet Take 1 mg by mouth daily.   furosemide (LASIX) 40 MG tablet Take 1 tablet (40 mg total) by mouth daily.   gabapentin (NEURONTIN) 300 MG capsule Take 1  capsule (300 mg total) by mouth 2 (two) times daily.   GARLIC OIL PO Take 1 capsule by mouth daily.   guaiFENesin (MUCINEX) 600 MG 12 hr tablet Take 1 tablet (600 mg total) by mouth 2 (two) times daily as needed for cough.   lidocaine (LIDODERM) 5 % Place 1 patch onto the skin daily. Remove & Discard patch within 12 hours or as directed by MD   lisinopril (ZESTRIL) 20 MG tablet Take 20 mg by mouth daily.   lurasidone (LATUDA) 40 MG TABS tablet Take 40 mg by mouth daily after supper.   Melatonin 3 MG TABS Take 6 mg by mouth at bedtime.   metFORMIN (GLUCOPHAGE) 500 MG tablet Take 500 mg by mouth 2 (two) times daily with a meal.   methocarbamol (ROBAXIN) 500 MG tablet Take 1 tablet (500 mg total) by mouth every 8 (eight) hours as needed for muscle spasms.   methotrexate (RHEUMATREX) 2.5 MG tablet Take 15  mg by mouth once a week.   Mometasone Furoate 200 MCG/ACT AERO Inhale 2 puffs into the lungs at bedtime.   mycophenolate (CELLCEPT) 250 MG capsule Take 1,500 mg by mouth 2 (two) times daily.   nitroGLYCERIN (NITROSTAT) 0.4 MG SL tablet Place 1 tablet (0.4 mg total) under the tongue every 5 (five) minutes as needed for chest pain. (Patient not taking: Reported on 06/14/2021)   Olodaterol HCl (STRIVERDI RESPIMAT) 2.5 MCG/ACT AERS Inhale 2 puffs into the lungs daily.   OVER THE COUNTER MEDICATION Take 1 tablet by mouth at bedtime. Pure ZZZs sleep and destress   oxcarbazepine (TRILEPTAL) 600 MG tablet Take 600-900 mg by mouth See admin instructions. Take 677m twice daily and 9058mat bedtime   oxyCODONE-acetaminophen (PERCOCET/ROXICET) 5-325 MG tablet Take 1 tablet by mouth every 6 (six) hours as needed for severe pain.   OXYGEN Inhale 3 L into the lungs at bedtime.   pantoprazole (PROTONIX) 40 MG tablet Take 1 tablet (40 mg total) by mouth 2 (two) times daily.   Polyethyl Glycol-Propyl Glycol (SYSTANE OP) Place 1 drop into both eyes daily as needed (dry eye).   polyethylene glycol (MIRALAX / GLYCOLAX)  17 g packet Take 17 g by mouth daily.   PRESCRIPTION MEDICATION Inhale into the lungs See admin instructions. CPAP- At bedtime and during during any naps   senna-docusate (SENOKOT-S) 8.6-50 MG tablet Take 1 tablet by mouth 2 (two) times daily.   sodium chloride (OCEAN) 0.65 % SOLN nasal spray Place 2 sprays into both nostrils 4 (four) times daily as needed for congestion.   tamsulosin (FLOMAX) 0.4 MG CAPS capsule Take 0.4 mg by mouth daily.   traMADol (ULTRAM) 50 MG tablet Take 2 tablets (100 mg total) by mouth at bedtime.   traZODone (DESYREL) 50 MG tablet Take 75 mg by mouth at bedtime.   VITAMIN A PO Take 1 tablet by mouth daily.   vitamin C (ASCORBIC ACID) 500 MG tablet Take 500 mg by mouth 2 (two) times daily.   No facility-administered encounter medications on file as of 07/16/2021.     Thank you for the opportunity to participate in the care of Mr. HoCuttino The palliative care team will continue to follow. Please call our office at [phone] we can be of additional assistance.   Note: Portions of this note were generated with DrLobbyistDictation errors may occur despite best attempts at proofreading.  LiTeodoro SprayNP

## 2021-07-20 ENCOUNTER — Encounter: Payer: Self-pay | Admitting: Internal Medicine

## 2021-07-20 ENCOUNTER — Inpatient Hospital Stay: Payer: No Typology Code available for payment source

## 2021-07-20 ENCOUNTER — Other Ambulatory Visit: Payer: Self-pay

## 2021-07-20 ENCOUNTER — Other Ambulatory Visit: Payer: Self-pay | Admitting: Medical Oncology

## 2021-07-20 ENCOUNTER — Inpatient Hospital Stay: Payer: No Typology Code available for payment source | Attending: Internal Medicine | Admitting: Internal Medicine

## 2021-07-20 DIAGNOSIS — C3492 Malignant neoplasm of unspecified part of left bronchus or lung: Secondary | ICD-10-CM

## 2021-07-20 DIAGNOSIS — Z79899 Other long term (current) drug therapy: Secondary | ICD-10-CM | POA: Diagnosis not present

## 2021-07-20 DIAGNOSIS — J910 Malignant pleural effusion: Secondary | ICD-10-CM | POA: Diagnosis not present

## 2021-07-20 DIAGNOSIS — C3432 Malignant neoplasm of lower lobe, left bronchus or lung: Secondary | ICD-10-CM | POA: Insufficient documentation

## 2021-07-20 DIAGNOSIS — Z7189 Other specified counseling: Secondary | ICD-10-CM | POA: Insufficient documentation

## 2021-07-20 DIAGNOSIS — Z5111 Encounter for antineoplastic chemotherapy: Secondary | ICD-10-CM | POA: Diagnosis not present

## 2021-07-20 DIAGNOSIS — C3490 Malignant neoplasm of unspecified part of unspecified bronchus or lung: Secondary | ICD-10-CM

## 2021-07-20 DIAGNOSIS — Z9981 Dependence on supplemental oxygen: Secondary | ICD-10-CM | POA: Diagnosis not present

## 2021-07-20 LAB — CMP (CANCER CENTER ONLY)
ALT: 19 U/L (ref 0–44)
AST: 17 U/L (ref 15–41)
Albumin: 4 g/dL (ref 3.5–5.0)
Alkaline Phosphatase: 85 U/L (ref 38–126)
Anion gap: 7 (ref 5–15)
BUN: 11 mg/dL (ref 8–23)
CO2: 27 mmol/L (ref 22–32)
Calcium: 9.3 mg/dL (ref 8.9–10.3)
Chloride: 102 mmol/L (ref 98–111)
Creatinine: 1.05 mg/dL (ref 0.61–1.24)
GFR, Estimated: >60 mL/min (ref >60)
Glucose, Bld: 181 mg/dL — ABNORMAL HIGH (ref 70–99)
Potassium: 4.6 mmol/L (ref 3.5–5.1)
Sodium: 136 mmol/L (ref 135–145)
Total Bilirubin: 0.3 mg/dL (ref 0.3–1.2)
Total Protein: 7 g/dL (ref 6.5–8.1)

## 2021-07-20 LAB — CBC WITH DIFFERENTIAL (CANCER CENTER ONLY)
Abs Immature Granulocytes: 0.02 10*3/uL (ref 0.00–0.07)
Basophils Absolute: 0.1 10*3/uL (ref 0.0–0.1)
Basophils Relative: 1 %
Eosinophils Absolute: 0.2 10*3/uL (ref 0.0–0.5)
Eosinophils Relative: 2 %
HCT: 33.9 % — ABNORMAL LOW (ref 39.0–52.0)
Hemoglobin: 11 g/dL — ABNORMAL LOW (ref 13.0–17.0)
Immature Granulocytes: 0 %
Lymphocytes Relative: 10 %
Lymphs Abs: 0.8 10*3/uL (ref 0.7–4.0)
MCH: 31 pg (ref 26.0–34.0)
MCHC: 32.4 g/dL (ref 30.0–36.0)
MCV: 95.5 fL (ref 80.0–100.0)
Monocytes Absolute: 0.7 10*3/uL (ref 0.1–1.0)
Monocytes Relative: 9 %
Neutro Abs: 6.3 10*3/uL (ref 1.7–7.7)
Neutrophils Relative %: 78 %
Platelet Count: 552 10*3/uL — ABNORMAL HIGH (ref 150–400)
RBC: 3.55 MIL/uL — ABNORMAL LOW (ref 4.22–5.81)
RDW: 15 % (ref 11.5–15.5)
WBC Count: 8.1 10*3/uL (ref 4.0–10.5)
nRBC: 0 % (ref 0.0–0.2)

## 2021-07-20 NOTE — Progress Notes (Signed)
[place] [phone]   Fax:([phone]) [place] [address] [place] [address]  DIAGNOSIS: Metastatic non-small cell lung cancer, adenocarcinoma initially diagnosed as stage IIb (T3, N0, M0) non-small cell lung cancer, adenocarcinoma presented with left lower lobe lung mass diagnosed in June 2022.  The patient also has suspicious pleural-based nodule and small left pleural effusion on the recent PET scan that could be concerning for metastatic disease but not completely confirmed at this point.  PRIOR THERAPY: Status post SBRT to the left lower lobe lung mass under the care of Dr. Sondra Come completed on 10/15/2020.  CURRENT THERAPY: Observation.  INTERVAL HISTORY: Mario [name] 71 y.o. male returns to the clinic today for follow-up visit.  The patient was admitted to the hospital few weeks ago complaining of worsening dyspnea and imaging studies showed enlarging left pleural effusion.  He underwent ultrasound-guided thoracentesis.  The final cytology was consistent with metastatic adenocarcinoma.  The patient was referred back to the clinic for evaluation and recommendation regarding treatment of his condition.  He continues to have significant shortness of breath at baseline and he is currently on home oxygen.  He denied having any chest pain but has cough with no hemoptysis.  He denied having any recent weight loss or night sweats.  He has no nausea, vomiting, diarrhea or constipation.  He has no headache or visual changes.   MEDICAL HISTORY: Past Medical History:  Diagnosis Date   Anxiety    Bronchitis    COPD (chronic obstructive pulmonary disease) ([place])    Depression    History of radiation therapy    Left lung- 10/05/20-10/15/20- Dr. Gery Pray   Hypertension    lung ca 09/2020   Mental disorder    MI (myocardial infarction) ([place])    ????   OSA (obstructive sleep apnea)     Suicide attempt ([place])    Tension pneumothorax 06/27/2016   Uveitis     ALLERGIES:  is allergic to demerol [meperidine] and zocor [simvastatin].  MEDICATIONS:  Current Outpatient Medications  Medication Sig Dispense Refill   albuterol (PROVENTIL) (2.5 MG/3ML) 0.083% nebulizer solution Inhale 3 mLs (2.5 mg total) by nebulization every 4 (four) hours as needed for wheezing or shortness of breath. (Patient taking differently: Take 2.5 mg by nebulization every 6 (six) hours as needed for wheezing or shortness of breath.) 90 mL 2   albuterol (VENTOLIN HFA) 108 (90 Base) MCG/ACT inhaler Inhale 2 puffs into the lungs every 6 (six) hours as needed for wheezing.     apixaban (ELIQUIS) 5 MG TABS tablet Take 5 mg by mouth 2 (two) times daily.     aspirin 81 MG EC tablet Take 81 mg by mouth daily.     b complex vitamins capsule Take 1 capsule by mouth daily.     buPROPion (WELLBUTRIN XL) 300 MG 24 hr tablet Take 300 mg by mouth daily.     busPIRone (BUSPAR) 15 MG tablet Take 15 mg by mouth daily.     calcium-vitamin D (OSCAL WITH D) 500-200 MG-UNIT tablet Take 1 tablet by mouth 2 (two) times daily with a meal.     carboxymethylcellulose (REFRESH PLUS) 0.5 % SOLN Place 1 drop into both eyes 4 (four) times daily as needed (dry eyes).     Cholecalciferol (VITAMIN D-3) 25 MCG (1000 UT) CAPS Take 1,000 Units by mouth 2 (two) times daily.     clobetasol ointment (TEMOVATE)  0.27 % Apply 1 application. topically 2 (two) times daily as needed (for scars).     clotrimazole-betamethasone (LOTRISONE) cream Apply 1 application. topically 2 (two) times daily as needed (fungus).     cycloSPORINE (RESTASIS) 0.05 % ophthalmic emulsion Place 1 drop into both eyes 2 (two) times daily.     diltiazem (CARDIZEM CD) 180 MG 24 hr capsule Take 180 mg by mouth daily.     DULoxetine (CYMBALTA) 60 MG capsule Take 1 capsule (60 mg total) by mouth 2 (two) times daily. 60 capsule 0   feeding supplement (ENSURE ENLIVE / ENSURE PLUS)  LIQD Take 237 mLs by mouth 3 (three) times daily between meals. (Patient taking differently: Take 1 Bottle by mouth daily.) 10000 mL 0   fluticasone (FLONASE) 50 MCG/ACT nasal spray Place 2 sprays into both nostrils daily.      folic acid (FOLVITE) 1 MG tablet Take 1 mg by mouth daily.     furosemide (LASIX) 40 MG tablet Take 1 tablet (40 mg total) by mouth daily. 30 tablet 0   gabapentin (NEURONTIN) 300 MG capsule Take 1 capsule (300 mg total) by mouth 2 (two) times daily. 20 capsule 0   GARLIC OIL PO Take 1 capsule by mouth daily.     guaiFENesin (MUCINEX) 600 MG 12 hr tablet Take 1 tablet (600 mg total) by mouth 2 (two) times daily as needed for cough.     lidocaine (LIDODERM) 5 % Place 1 patch onto the skin daily. Remove & Discard patch within 12 hours or as directed by MD 6 patch 0   lisinopril (ZESTRIL) 20 MG tablet Take 20 mg by mouth daily.     lurasidone (LATUDA) 40 MG TABS tablet Take 40 mg by mouth daily after supper.     Melatonin 3 MG TABS Take 6 mg by mouth at bedtime.     metFORMIN (GLUCOPHAGE) 500 MG tablet Take 500 mg by mouth 2 (two) times daily with a meal.     methocarbamol (ROBAXIN) 500 MG tablet Take 1 tablet (500 mg total) by mouth every 8 (eight) hours as needed for muscle spasms. 30 tablet 0   methotrexate (RHEUMATREX) 2.5 MG tablet Take 15 mg by mouth once a week.     Mometasone Furoate 200 MCG/ACT AERO Inhale 2 puffs into the lungs at bedtime.     mycophenolate (CELLCEPT) 250 MG capsule Take 1,500 mg by mouth 2 (two) times daily.     nitroGLYCERIN (NITROSTAT) 0.4 MG SL tablet Place 1 tablet (0.4 mg total) under the tongue every 5 (five) minutes as needed for chest pain. (Patient not taking: Reported on 06/14/2021) 12 tablet 12   Olodaterol HCl (STRIVERDI RESPIMAT) 2.5 MCG/ACT AERS Inhale 2 puffs into the lungs daily.     OVER THE COUNTER MEDICATION Take 1 tablet by mouth at bedtime. Pure ZZZs sleep and destress     oxcarbazepine (TRILEPTAL) 600 MG tablet Take 600-900 mg  by mouth See admin instructions. Take 600mg  twice daily and 900mg  at bedtime     oxyCODONE-acetaminophen (PERCOCET/ROXICET) 5-325 MG tablet Take 1 tablet by mouth every 6 (six) hours as needed for severe pain. 10 tablet 0   OXYGEN Inhale 3 L into the lungs at bedtime.     pantoprazole (PROTONIX) 40 MG tablet Take 1 tablet (40 mg total) by mouth 2 (two) times daily. 60 tablet 0   Polyethyl Glycol-Propyl Glycol (SYSTANE OP) Place 1 drop into both eyes daily as needed (dry eye).     polyethylene  glycol (MIRALAX / GLYCOLAX) 17 g packet Take 17 g by mouth daily.     PRESCRIPTION MEDICATION Inhale into the lungs See admin instructions. CPAP- At bedtime and during during any naps     senna-docusate (SENOKOT-S) 8.6-50 MG tablet Take 1 tablet by mouth 2 (two) times daily. 30 tablet 0   sodium chloride (OCEAN) 0.65 % SOLN nasal spray Place 2 sprays into both nostrils 4 (four) times daily as needed for congestion.     tamsulosin (FLOMAX) 0.4 MG CAPS capsule Take 0.4 mg by mouth daily.     traMADol (ULTRAM) 50 MG tablet Take 2 tablets (100 mg total) by mouth at bedtime. 10 tablet 0   traZODone (DESYREL) 50 MG tablet Take 75 mg by mouth at bedtime.     VITAMIN A PO Take 1 tablet by mouth daily.     vitamin C (ASCORBIC ACID) 500 MG tablet Take 500 mg by mouth 2 (two) times daily.     No current facility-administered medications for this visit.    SURGICAL HISTORY:  Past Surgical History:  Procedure Laterality Date   BIOPSY  07/03/2021   Procedure: BIOPSY;  Surgeon: Otis Brace, MD;  Location: [facility];  Service: Gastroenterology;;   BRONCHIAL BIOPSY  07/30/2020   Procedure: BRONCHIAL BIOPSIES;  Surgeon: Garner Nash, DO;  Location: [facility];  Service: Pulmonary;;   BRONCHIAL BRUSHINGS  07/30/2020   Procedure: BRONCHIAL BRUSHINGS;  Surgeon: Garner Nash, DO;  Location: [facility];  Service: Pulmonary;;   BRONCHIAL NEEDLE ASPIRATION BIOPSY  07/30/2020   Procedure: BRONCHIAL NEEDLE  ASPIRATION BIOPSIES;  Surgeon: Garner Nash, DO;  Location: [facility];  Service: Pulmonary;;   BRONCHIAL WASHINGS  07/30/2020   Procedure: BRONCHIAL WASHINGS;  Surgeon: Garner Nash, DO;  Location: 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     ESOPHAGOGASTRODUODENOSCOPY N/A 07/03/2021   Procedure: ESOPHAGOGASTRODUODENOSCOPY (EGD);  Surgeon: Otis Brace, MD;  Location: [name];  Service: Gastroenterology;  Laterality: N/A;   SKIN CANCER EXCISION     VIDEO BRONCHOSCOPY WITH ENDOBRONCHIAL NAVIGATION Left 07/30/2020   Procedure: VIDEO BRONCHOSCOPY WITH ENDOBRONCHIAL NAVIGATION;  Surgeon: Garner Nash, DO;  Location: [facility];  Service: Pulmonary;  Laterality: Left;    REVIEW OF SYSTEMS:  Constitutional: positive for fatigue Eyes: negative Ears, nose, mouth, throat, and face: negative Respiratory: positive for cough and dyspnea on exertion Cardiovascular: negative Gastrointestinal: negative Genitourinary:negative Integument/breast: negative Hematologic/lymphatic: negative Musculoskeletal:positive for muscle weakness Neurological: negative Behavioral/Psych: negative Endocrine: negative Allergic/Immunologic: negative   PHYSICAL EXAMINATION: General appearance: alert, cooperative, fatigued, and no distress Head: Normocephalic, without obvious abnormality, atraumatic Neck: no adenopathy, no JVD, supple, symmetrical, trachea midline, and thyroid not enlarged, symmetric, no tenderness/mass/nodules Lymph nodes: Cervical, supraclavicular, and axillary nodes normal. Resp: diminished breath sounds LLL and dullness to percussion LLL Back: symmetric, no curvature. ROM normal. No CVA tenderness. Cardio: regular rate and rhythm, S1, S2 normal, no murmur, click, rub or gallop GI: soft, non-tender; bowel sounds normal; no masses,  no organomegaly Extremities: extremities normal, atraumatic, no cyanosis or edema Neurologic: Alert  and oriented X 3, normal strength and tone. Normal symmetric reflexes. Normal coordination and gait  ECOG PERFORMANCE STATUS: 1 - Symptomatic but completely ambulatory  Blood pressure 121/70, pulse (!) 112, temperature 98.5 F (36.9 C), temperature source Tympanic, resp. rate 17, weight (!) 321 lb 3.2 oz (145.7 kg), SpO2 96 %.  LABORATORY DATA: Lab Results  Component Value Date   WBC 8.1  07/20/2021   HGB 11.0 (L) 07/20/2021   HCT 33.9 (L) 07/20/2021   MCV 95.5 07/20/2021   PLT 552 (H) 07/20/2021      Chemistry      Component Value Date/Time   NA 136 07/20/2021 0857   K 4.6 07/20/2021 0857   CL 102 07/20/2021 0857   CO2 27 07/20/2021 0857   BUN 11 07/20/2021 0857   CREATININE 1.05 07/20/2021 0857      Component Value Date/Time   CALCIUM 9.3 07/20/2021 0857   ALKPHOS 85 07/20/2021 0857   AST 17 07/20/2021 0857   ALT 19 07/20/2021 0857   BILITOT 0.3 07/20/2021 0857       RADIOGRAPHIC STUDIES: DG Chest 2 View  Result Date: 07/04/2021 CLINICAL DATA:  Pleural effusion. EXAM: CHEST - 2 VIEW COMPARISON:  Radiograph 06/30/2021, CT 06/23/2021 FINDINGS: Stable radiographic size and appearance of left pleural effusion. Patchy airspace disease at the left lung base as well as air-filled cavity/bleb, grossly stable. Emphysematous changes with chronic bronchial thickening. Stable heart size and mediastinal contours. No visible pneumothorax. IMPRESSION: 1. Unchanged radiographic size and appearance of left pleural effusion. 2. Unchanged patchy airspace disease at the left lung base with air-filled cavity/bleb. 3. Emphysema. Electronically Signed   By: Keith Rake M.D.   On: 07/04/2021 14:44   DG Chest 2 View  Result Date: 06/30/2021 CLINICAL DATA:  Dyspnea EXAM: CHEST - 2 VIEW COMPARISON:  Previous studies including the examination 06/24/2021 FINDINGS: Cardiac size is within normal limits. There are no signs of alveolar pulmonary edema. Patchy infiltrates are seen in the left lower  lung fields. There is possible slight improvement with infiltrate appearing less homogeneous. There is small to moderate left pleural effusion. There is no significant right pleural effusion. There is no pneumothorax. IMPRESSION: There is patchy infiltrate in the left lower lung fields with slight interval improvement. Findings suggest atelectasis/pneumonia. There is small to moderate left pleural effusion. Electronically Signed   By: Elmer Picker M.D.   On: 06/30/2021 15:31   DG Pelvis 1-2 Views  Result Date: 06/27/2021 CLINICAL DATA:  Post fall with pelvic pain. EXAM: PELVIS - 1-2 VIEW COMPARISON:  Pelvis radiograph 06/24/2021, 2 days prior FINDINGS: Detailed assessment is limited by soft tissue attenuation from habitus. The cortical margins of the bony pelvis are intact. No fracture. Pubic symphysis and sacroiliac joints are congruent. Mild right hip osteoarthritis. Both femoral heads are well-seated in the respective acetabula. IMPRESSION: No fracture of the pelvis. If there is persistent clinical concern for fracture, recommend CT. Electronically Signed   By: Keith Rake M.D.   On: 06/27/2021 12:22   CT HEAD WO CONTRAST (5MM)  Result Date: 06/25/2021 CLINICAL DATA:  Fall, posterior head injury EXAM: CT HEAD WITHOUT CONTRAST TECHNIQUE: Contiguous axial images were obtained from the base of the skull through the vertex without intravenous contrast. RADIATION DOSE REDUCTION: This exam was performed according to the departmental dose-optimization program which includes automated exposure control, adjustment of the mA and/or kV according to patient size and/or use of iterative reconstruction technique. COMPARISON:  07/22/2020 FINDINGS: Brain: No evidence of acute infarction, hemorrhage, hydrocephalus, extra-axial collection or mass lesion/mass effect. Mild cortical atrophy. Subcortical white matter and periventricular small vessel ischemic changes. Vascular: No hyperdense vessel or unexpected  calcification. Skull: Normal. Negative for fracture or focal lesion. Sinuses/Orbits: The visualized paranasal sinuses are essentially clear. The mastoid air cells are unopacified. Other: None. IMPRESSION: No evidence of acute intracranial abnormality. Mild atrophy with small vessel ischemic changes.  Electronically Signed   By: Julian Hy M.D.   On: 06/25/2021 00:55   CT CHEST W CONTRAST  Result Date: 06/23/2021 CLINICAL DATA:  Pleural effusion.  Malignant effusion suspected. EXAM: CT CHEST WITH CONTRAST TECHNIQUE: Multidetector CT imaging of the chest was performed during intravenous contrast administration. RADIATION DOSE REDUCTION: This exam was performed according to the departmental dose-optimization program which includes automated exposure control, adjustment of the mA and/or kV according to patient size and/or use of iterative reconstruction technique. CONTRAST:  56mL OMNIPAQUE IOHEXOL 300 MG/ML  SOLN COMPARISON:  CT 06/04/2021 FINDINGS: Cardiovascular: Coronary artery calcification and aortic atherosclerotic calcification. Mediastinum/Nodes: No axillary or supraclavicular adenopathy. No mediastinal or hilar adenopathy. No pericardial fluid. Esophagus normal. Lungs/Pleura: There is reduction in the RIGHT pleural effusion compared to prior CT. Slight decrease in density of consolidation in the RIGHT lower lobe. Associated bullous lesions unchanged. Centrilobular emphysema the upper lobes. Bullous change at the RIGHT lung base. Upper Abdomen: Limited view of the liver, kidneys, pancreas are unremarkable. Normal adrenal glands. Musculoskeletal: No aggressive osseous lesion. IMPRESSION: 1. Some improvement in consolidation and effusion at the LEFT lung base compared to CT 06/04/2021. 2. Bibasilar bullous change. 3. Severe centrilobular emphysema the upper lobes. 4.  Coronary artery calcification and band Electronically Signed   By: Suzy Bouchard M.D.   On: 06/23/2021 16:31   DG Pelvis  Portable  Result Date: 06/24/2021 CLINICAL DATA:  Fall, pelvic pain EXAM: PORTABLE PELVIS 1-2 VIEWS COMPARISON:  None. FINDINGS: There is no evidence of pelvic fracture or diastasis. Mild right hip degenerative arthritis with joint space narrowing. No pelvic bone lesions are seen. IMPRESSION: No acute fracture or dislocation. Electronically Signed   By: Fidela Salisbury M.D.   On: 06/24/2021 23:28   DG CHEST PORT 1 VIEW  Result Date: 06/24/2021 CLINICAL DATA:  Shortness of breath EXAM: PORTABLE CHEST 1 VIEW COMPARISON:  Chest x-ray dated June 23, 2021 FINDINGS: Cardiac and mediastinal contours are unchanged. Stable small left pleural effusion. Increased bilateral lower lung opacities. No evidence of pneumothorax. IMPRESSION: 1. Increased bilateral lower lung opacities, possibly due to worsening atelectasis versus infection or aspiration. 2. Stable small left pleural effusion. Electronically Signed   By: Yetta Glassman M.D.   On: 06/24/2021 13:52   DG CHEST PORT 1 VIEW  Result Date: 06/23/2021 CLINICAL DATA:  Thoracentesis. EXAM: PORTABLE CHEST 1 VIEW COMPARISON:  06/20/2021 and CT chest 06/04/2021, 02/03/2021. FINDINGS: Trachea is midline. Heart size normal. Partially loculated collection of fluid in the lower left hemithorax, stable. Large parenchymal cyst in the left lower lobe. Minimal subsegmental atelectasis in the right lung base. IMPRESSION: 1. Partially loculated small to moderate left pleural effusion at the base of the left hemithorax, stable. 2. Minimal right basilar subsegmental atelectasis. Electronically Signed   By: Lorin Picket M.D.   On: 06/23/2021 11:31   DG CHEST PORT 1 VIEW  Result Date: 06/20/2021 CLINICAL DATA:  Left lower lobe pneumonia. EXAM: PORTABLE CHEST 1 VIEW COMPARISON:  CT of the chest June 04, 2021 FINDINGS: Cardiomediastinal silhouette is normal. Mediastinal contours appear intact. Persistent opacification of the left lower lung field. Prominent lung cyst is noted  in the left lower lobe. Left pleural effusion cannot be excluded. Upper lobe predominant emphysema. Right lung is otherwise clear. Osseous structures are without acute abnormality. Soft tissues are grossly normal. IMPRESSION: Persistent opacification of the left lower lung field. Left pleural effusion cannot be excluded. Underlying chronic lung changes. Electronically Signed   By: Fidela Salisbury  M.D.   On: 06/20/2021 16:48   ECHOCARDIOGRAM COMPLETE  Result Date: 06/29/2021    ECHOCARDIOGRAM REPORT   Patient Name:   Mario [name] [facility] Date of Exam: 06/29/2021 Medical Rec #:  [id number]        Height:       74.0 in Accession #:    [id number]       Weight:       328.3 lb Date of Birth:  1950/06/30        BSA:          2.683 m Patient Age:    70 years         BP:           112/64 mmHg Patient Gender: M                HR:           149 bpm. Exam Location:  Inpatient Procedure: 2D Echo and Cardiac Doppler Indications:    Abnormal ECG  History:        Patient has prior history of Echocardiogram examinations, most                 recent 07/03/2019. Previous Myocardial Infarction, COPD; Risk                 Factors:Hypertension.  Sonographer:    Neomia Dear RDCS Referring Phys: [id number] [facility]  Sonographer Comments: Technically difficult study due to poor echo windows, suboptimal parasternal window, no apical window, no subcostal window and patient is morbidly obese. Image acquisition challenging due to patient body habitus and Image acquisition challenging due to COPD. IMPRESSIONS  1. Very technically difficult study, with extremely limited views. The left ventricle has grossly normal function. Left ventricular endocardial border not optimally defined to evaluate regional wall motion. Left ventricular diastolic parameters are indeterminate.  2. Right ventricular systolic function was not well visualized. The right ventricular size is not well visualized.  3. The mitral valve was not well visualized.  4. The aortic  valve was not well visualized. Aortic valve regurgitation is not visualized.  5. Aortic dilatation noted. There is mild dilatation of the ascending aorta, measuring 39 mm. FINDINGS  Left Ventricle: Left ventricular ejection fraction, by estimation, is 60 to 65%. The left ventricle has normal function. Left ventricular endocardial border not optimally defined to evaluate regional wall motion. The left ventricular internal cavity size was normal in size. Suboptimal image quality limits for assessment of left ventricular hypertrophy. Left ventricular diastolic parameters are indeterminate. Right Ventricle: The right ventricular size is not well visualized. Right vetricular wall thickness was not well visualized. Right ventricular systolic function was not well visualized. Left Atrium: Left atrial size was not well visualized. Right Atrium: Right atrial size was not well visualized. Pericardium: Trivial pericardial effusion is present. Mitral Valve: The mitral valve was not well visualized. No evidence of mitral valve regurgitation. Tricuspid Valve: The tricuspid valve is not well visualized. Tricuspid valve regurgitation is not demonstrated. Aortic Valve: The aortic valve was not well visualized. Aortic valve regurgitation is not visualized. Pulmonic Valve: The pulmonic valve was not well visualized. Pulmonic valve regurgitation is not visualized. Aorta: Aortic dilatation noted. There is mild dilatation of the ascending aorta, measuring 39 mm. IAS/Shunts: The interatrial septum was not well visualized.  LEFT VENTRICLE PLAX 2D LVIDd:         4.80 cm LVIDs:         2.20 cm LV PW:  1.40 cm LV IVS:        1.10 cm LVOT diam:     2.30 cm LVOT Area:     4.15 cm  LEFT ATRIUM         Index LA diam:    3.80 cm 1.42 cm/m                        PULMONIC VALVE AORTA                 PV Vmax:       1.45 m/s Ao Root diam: 3.70 cm PV Vmean:      101.000 cm/s Ao Asc diam:  3.90 cm PV VTI:        0.244 m                       PV  Peak grad:  8.4 mmHg                       PV Mean grad:  5.0 mmHg   SHUNTS Systemic Diam: 2.30 cm Oswaldo Milian MD Electronically signed by Oswaldo Milian MD Signature Date/Time: 06/29/2021/4:57:50 PM    Final    [place] EKG SITE RITE  Result Date: 06/29/2021 If Site Rite image not attached, placement could not be confirmed due to current cardiac rhythm.   ASSESSMENT AND PLAN: This is a very pleasant 71 years old white male diagnosed with metastatic non-small cell lung cancer initially diagnosed as stage IIb (T3, N0, M0) non-small cell lung cancer, adenocarcinoma presented with left lower lobe lung mass diagnosed in June 2022 status post SBRT to the left lower lobe lung mass under the care of Dr. Sondra Come completed on 10/15/2020.  He has evidence of malignant left pleural effusion in May 2023. His performance status is poor and the patient may not be able to tolerate a course of concurrent chemoradiation. I recommended for the patient to have a molecular studies by sending blood test as well as the tissue from the pleural fluid to [id number] for molecular studies and PD-L1 expression. If the patient has an actionable mutations, he could be treated with targeted therapy. If he has no actionable mutations but high PD-L1 expression I could also consider him for treatment with immunotherapy without chemotherapy. I had a lengthy discussion with the patient today about his condition and treatment options.  I offered him the option of palliative care and hospice referral versus consideration of systemic treatment based on the molecular studies and PD-L1 expression. The patient is still interested in some form of treatment. I will wait for the molecular studies before making final decision regarding his treatment. I will arrange for the patient to come back for follow-up visit in 2-3 weeks for more detailed discussion of his treatment options based on the molecular studies. For the recurrent left pleural  effusion, I will consider repeating ultrasound-guided thoracentesis based on his symptoms or imaging studies. The patient was advised to call immediately if he has any other concerning symptoms in the interval. The patient voices understanding of current disease status and treatment options and is in agreement with the current care plan.  All questions were answered. The patient knows to call the clinic with any problems, questions or concerns. We can certainly see the patient much sooner if necessary.  The total time spent in the appointment was 35 minutes.  Disclaimer: This note was dictated with voice recognition  software. Similar sounding words can inadvertently be transcribed and may not be corrected upon review.

## 2021-07-29 ENCOUNTER — Other Ambulatory Visit (HOSPITAL_COMMUNITY): Payer: No Typology Code available for payment source

## 2021-07-29 DIAGNOSIS — Z9181 History of falling: Secondary | ICD-10-CM | POA: Insufficient documentation

## 2021-07-29 DIAGNOSIS — I129 Hypertensive chronic kidney disease with stage 1 through stage 4 chronic kidney disease, or unspecified chronic kidney disease: Secondary | ICD-10-CM | POA: Insufficient documentation

## 2021-07-29 HISTORY — DX: History of falling: Z91.81

## 2021-07-30 LAB — GUARDANT 360

## 2021-08-03 ENCOUNTER — Inpatient Hospital Stay (HOSPITAL_COMMUNITY)
Admission: EM | Admit: 2021-08-03 | Discharge: 2021-08-05 | DRG: 181 | Disposition: A | Discharge status: Home Health | Payer: No Typology Code available for payment source | Attending: Internal Medicine | Admitting: Internal Medicine

## 2021-08-03 ENCOUNTER — Emergency Department (HOSPITAL_COMMUNITY): Payer: No Typology Code available for payment source

## 2021-08-03 DIAGNOSIS — F419 Anxiety disorder, unspecified: Secondary | ICD-10-CM | POA: Diagnosis present

## 2021-08-03 DIAGNOSIS — Z85828 Personal history of other malignant neoplasm of skin: Secondary | ICD-10-CM

## 2021-08-03 DIAGNOSIS — Z6841 Body Mass Index (BMI) 40.0 and over, adult: Secondary | ICD-10-CM | POA: Diagnosis not present

## 2021-08-03 DIAGNOSIS — Z79899 Other long term (current) drug therapy: Secondary | ICD-10-CM

## 2021-08-03 DIAGNOSIS — G4733 Obstructive sleep apnea (adult) (pediatric): Secondary | ICD-10-CM | POA: Diagnosis present

## 2021-08-03 DIAGNOSIS — C3432 Malignant neoplasm of lower lobe, left bronchus or lung: Secondary | ICD-10-CM | POA: Diagnosis present

## 2021-08-03 DIAGNOSIS — Z885 Allergy status to narcotic agent status: Secondary | ICD-10-CM

## 2021-08-03 DIAGNOSIS — I252 Old myocardial infarction: Secondary | ICD-10-CM

## 2021-08-03 DIAGNOSIS — J432 Centrilobular emphysema: Secondary | ICD-10-CM | POA: Diagnosis present

## 2021-08-03 DIAGNOSIS — E1101 Type 2 diabetes mellitus with hyperosmolarity with coma: Secondary | ICD-10-CM

## 2021-08-03 DIAGNOSIS — X58XXXA Exposure to other specified factors, initial encounter: Secondary | ICD-10-CM | POA: Diagnosis present

## 2021-08-03 DIAGNOSIS — E119 Type 2 diabetes mellitus without complications: Secondary | ICD-10-CM

## 2021-08-03 DIAGNOSIS — S2232XA Fracture of one rib, left side, initial encounter for closed fracture: Secondary | ICD-10-CM | POA: Diagnosis present

## 2021-08-03 DIAGNOSIS — Z7901 Long term (current) use of anticoagulants: Secondary | ICD-10-CM | POA: Diagnosis not present

## 2021-08-03 DIAGNOSIS — E1159 Type 2 diabetes mellitus with other circulatory complications: Secondary | ICD-10-CM | POA: Diagnosis present

## 2021-08-03 DIAGNOSIS — R6889 Other general symptoms and signs: Secondary | ICD-10-CM

## 2021-08-03 DIAGNOSIS — Z515 Encounter for palliative care: Secondary | ICD-10-CM | POA: Diagnosis not present

## 2021-08-03 DIAGNOSIS — I129 Hypertensive chronic kidney disease with stage 1 through stage 4 chronic kidney disease, or unspecified chronic kidney disease: Secondary | ICD-10-CM | POA: Diagnosis present

## 2021-08-03 DIAGNOSIS — E1169 Type 2 diabetes mellitus with other specified complication: Secondary | ICD-10-CM | POA: Diagnosis present

## 2021-08-03 DIAGNOSIS — Z818 Family history of other mental and behavioral disorders: Secondary | ICD-10-CM

## 2021-08-03 DIAGNOSIS — R0602 Shortness of breath: Secondary | ICD-10-CM | POA: Diagnosis not present

## 2021-08-03 DIAGNOSIS — I152 Hypertension secondary to endocrine disorders: Secondary | ICD-10-CM | POA: Diagnosis present

## 2021-08-03 DIAGNOSIS — Z7982 Long term (current) use of aspirin: Secondary | ICD-10-CM

## 2021-08-03 DIAGNOSIS — J910 Malignant pleural effusion: Secondary | ICD-10-CM | POA: Diagnosis present

## 2021-08-03 DIAGNOSIS — R0789 Other chest pain: Secondary | ICD-10-CM

## 2021-08-03 DIAGNOSIS — Z7984 Long term (current) use of oral hypoglycemic drugs: Secondary | ICD-10-CM

## 2021-08-03 DIAGNOSIS — Z9981 Dependence on supplemental oxygen: Secondary | ICD-10-CM | POA: Diagnosis not present

## 2021-08-03 DIAGNOSIS — R0600 Dyspnea, unspecified: Secondary | ICD-10-CM | POA: Diagnosis present

## 2021-08-03 DIAGNOSIS — J939 Pneumothorax, unspecified: Secondary | ICD-10-CM | POA: Diagnosis present

## 2021-08-03 DIAGNOSIS — R0781 Pleurodynia: Secondary | ICD-10-CM

## 2021-08-03 DIAGNOSIS — J9611 Chronic respiratory failure with hypoxia: Secondary | ICD-10-CM | POA: Diagnosis present

## 2021-08-03 DIAGNOSIS — E1122 Type 2 diabetes mellitus with diabetic chronic kidney disease: Secondary | ICD-10-CM | POA: Diagnosis present

## 2021-08-03 DIAGNOSIS — Z888 Allergy status to other drugs, medicaments and biological substances status: Secondary | ICD-10-CM | POA: Diagnosis not present

## 2021-08-03 DIAGNOSIS — Z20822 Contact with and (suspected) exposure to covid-19: Secondary | ICD-10-CM | POA: Diagnosis present

## 2021-08-03 DIAGNOSIS — E669 Obesity, unspecified: Secondary | ICD-10-CM | POA: Diagnosis present

## 2021-08-03 DIAGNOSIS — N189 Chronic kidney disease, unspecified: Secondary | ICD-10-CM | POA: Diagnosis present

## 2021-08-03 DIAGNOSIS — F32A Depression, unspecified: Secondary | ICD-10-CM | POA: Diagnosis present

## 2021-08-03 DIAGNOSIS — Z87891 Personal history of nicotine dependence: Secondary | ICD-10-CM

## 2021-08-03 LAB — RESP PANEL BY RT-PCR (FLU A&B, COVID) ARPGX2
Influenza A by PCR: NEGATIVE
Influenza B by PCR: NEGATIVE
SARS Coronavirus 2 by RT PCR: NEGATIVE

## 2021-08-03 LAB — CBC
HCT: 39.6 % (ref 39.0–52.0)
Hemoglobin: 12.6 g/dL — ABNORMAL LOW (ref 13.0–17.0)
MCH: 29.9 pg (ref 26.0–34.0)
MCHC: 31.8 g/dL (ref 30.0–36.0)
MCV: 93.8 fL (ref 80.0–100.0)
Platelets: 425 10*3/uL — ABNORMAL HIGH (ref 150–400)
RBC: 4.22 MIL/uL (ref 4.22–5.81)
RDW: 14.6 % (ref 11.5–15.5)
WBC: 11.1 10*3/uL — ABNORMAL HIGH (ref 4.0–10.5)
nRBC: 0 % (ref 0.0–0.2)

## 2021-08-03 LAB — BASIC METABOLIC PANEL
Anion gap: 10 (ref 5–15)
BUN: 9 mg/dL (ref 8–23)
CO2: 22 mmol/L (ref 22–32)
Calcium: 8.9 mg/dL (ref 8.9–10.3)
Chloride: 101 mmol/L (ref 98–111)
Creatinine, Ser: 0.88 mg/dL (ref 0.61–1.24)
GFR, Estimated: >60 mL/min (ref >60)
Glucose, Bld: 142 mg/dL — ABNORMAL HIGH (ref 70–99)
Potassium: 4.7 mmol/L (ref 3.5–5.1)
Sodium: 133 mmol/L — ABNORMAL LOW (ref 135–145)

## 2021-08-03 LAB — BLOOD GAS, VENOUS
Acid-Base Excess: 2.5 mmol/L — ABNORMAL HIGH (ref 0.0–2.0)
Bicarbonate: 28.4 mmol/L — ABNORMAL HIGH (ref 20.0–28.0)
O2 Saturation: 38.8 %
Patient temperature: 37
pCO2, Ven: 48 mmHg (ref 44–60)
pH, Ven: 7.38 (ref 7.25–7.43)
pO2, Ven: <31 mmHg — CL (ref 32–45)

## 2021-08-03 LAB — TROPONIN I (HIGH SENSITIVITY)
Troponin I (High Sensitivity): 6 ng/L (ref <18)
Troponin I (High Sensitivity): 7 ng/L (ref <18)

## 2021-08-03 LAB — HEPATIC FUNCTION PANEL
ALT: 18 U/L (ref 0–44)
AST: 33 U/L (ref 15–41)
Albumin: 3.9 g/dL (ref 3.5–5.0)
Alkaline Phosphatase: 118 U/L (ref 38–126)
Bilirubin, Direct: 0.3 mg/dL — ABNORMAL HIGH (ref 0.0–0.2)
Indirect Bilirubin: 0.7 mg/dL (ref 0.3–0.9)
Total Bilirubin: 1 mg/dL (ref 0.3–1.2)
Total Protein: 7.4 g/dL (ref 6.5–8.1)

## 2021-08-03 LAB — MAGNESIUM: Magnesium: 2.3 mg/dL (ref 1.7–2.4)

## 2021-08-03 MED ORDER — MOMETASONE FUROATE 200 MCG/ACT IN AERO: 2.0000 | INHALATION_SPRAY | Freq: Every day | RESPIRATORY_TRACT | Status: DC

## 2021-08-03 MED ORDER — ONDANSETRON HCL 4 MG PO TABS: 4.0000 mg | ORAL_TABLET | Freq: Four times a day (QID) | ORAL | Status: DC | PRN

## 2021-08-03 MED ORDER — OXCARBAZEPINE 300 MG PO TABS
600.0000 mg | ORAL_TABLET | Freq: Two times a day (BID) | ORAL | Status: DC
  Administered 2021-08-04 – 2021-08-05 (×4): 600 mg via ORAL
  Filled 2021-08-03 (×4): qty 2

## 2021-08-03 MED ORDER — BUDESONIDE 0.25 MG/2ML IN SUSP
0.2500 mg | Freq: Two times a day (BID) | RESPIRATORY_TRACT | Status: DC
  Administered 2021-08-03 – 2021-08-05 (×3): 0.25 mg via RESPIRATORY_TRACT
  Filled 2021-08-03 (×5): qty 2

## 2021-08-03 MED ORDER — TRAZODONE HCL 50 MG PO TABS
75.0000 mg | ORAL_TABLET | Freq: Every day | ORAL | Status: DC
  Administered 2021-08-03 – 2021-08-05 (×3): 75 mg via ORAL
  Filled 2021-08-03 (×3): qty 2

## 2021-08-03 MED ORDER — BUSPIRONE HCL 5 MG PO TABS
15.0000 mg | ORAL_TABLET | Freq: Every day | ORAL | Status: DC
  Administered 2021-08-03 – 2021-08-05 (×3): 15 mg via ORAL
  Filled 2021-08-03 (×3): qty 1

## 2021-08-03 MED ORDER — CYCLOSPORINE 0.05 % OP EMUL
1.0000 [drp] | Freq: Two times a day (BID) | OPHTHALMIC | Status: DC
Start: 2021-08-03 — End: 2021-08-06
  Administered 2021-08-03 – 2021-08-05 (×5): 1 [drp] via OPHTHALMIC
  Filled 2021-08-03 (×5): qty 30

## 2021-08-03 MED ORDER — IOHEXOL 350 MG/ML SOLN
75.0000 mL | Freq: Once | INTRAVENOUS | Status: AC | PRN
  Administered 2021-08-03: 75 mL via INTRAVENOUS

## 2021-08-03 MED ORDER — LURASIDONE HCL 40 MG PO TABS
40.0000 mg | ORAL_TABLET | Freq: Every day | ORAL | Status: DC
Start: 2021-08-03 — End: 2021-08-06
  Administered 2021-08-03 – 2021-08-05 (×3): 40 mg via ORAL
  Filled 2021-08-03 (×3): qty 1

## 2021-08-03 MED ORDER — DULOXETINE HCL 30 MG PO CPEP
60.0000 mg | ORAL_CAPSULE | Freq: Two times a day (BID) | ORAL | Status: DC
  Administered 2021-08-03 – 2021-08-05 (×5): 60 mg via ORAL
  Filled 2021-08-03 (×5): qty 2

## 2021-08-03 MED ORDER — ACETAMINOPHEN 650 MG RE SUPP: 650.0000 mg | Freq: Four times a day (QID) | RECTAL | Status: DC | PRN

## 2021-08-03 MED ORDER — BUPROPION HCL ER (XL) 300 MG PO TB24
300.0000 mg | ORAL_TABLET | Freq: Every day | ORAL | Status: DC
  Administered 2021-08-03 – 2021-08-05 (×3): 300 mg via ORAL
  Filled 2021-08-03 (×3): qty 1

## 2021-08-03 MED ORDER — MYCOPHENOLATE MOFETIL 250 MG PO CAPS
1500.0000 mg | ORAL_CAPSULE | Freq: Two times a day (BID) | ORAL | Status: DC
  Administered 2021-08-04 – 2021-08-05 (×4): 1500 mg via ORAL
  Filled 2021-08-03 (×5): qty 6

## 2021-08-03 MED ORDER — ONDANSETRON HCL 4 MG/2ML IJ SOLN
4.0000 mg | Freq: Four times a day (QID) | INTRAMUSCULAR | Status: DC | PRN
  Administered 2021-08-04 (×2): 4 mg via INTRAVENOUS
  Filled 2021-08-03 (×3): qty 2

## 2021-08-03 MED ORDER — GABAPENTIN 300 MG PO CAPS
300.0000 mg | ORAL_CAPSULE | Freq: Two times a day (BID) | ORAL | Status: DC
  Administered 2021-08-03 – 2021-08-05 (×5): 300 mg via ORAL
  Filled 2021-08-03 (×5): qty 1

## 2021-08-03 MED ORDER — ALBUTEROL SULFATE (2.5 MG/3ML) 0.083% IN NEBU: 2.5000 mg | INHALATION_SOLUTION | Freq: Four times a day (QID) | RESPIRATORY_TRACT | Status: DC | PRN

## 2021-08-03 MED ORDER — ENOXAPARIN SODIUM 80 MG/0.8ML IJ SOSY
75.0000 mg | PREFILLED_SYRINGE | INTRAMUSCULAR | Status: DC
  Administered 2021-08-03 – 2021-08-05 (×3): 75 mg via SUBCUTANEOUS
  Filled 2021-08-03 (×3): qty 0.8

## 2021-08-03 MED ORDER — PANTOPRAZOLE SODIUM 40 MG PO TBEC
40.0000 mg | DELAYED_RELEASE_TABLET | Freq: Two times a day (BID) | ORAL | Status: DC
Start: 2021-08-03 — End: 2021-08-03

## 2021-08-03 MED ORDER — DILTIAZEM HCL ER COATED BEADS 180 MG PO CP24
180.0000 mg | ORAL_CAPSULE | Freq: Every day | ORAL | Status: DC
  Administered 2021-08-03 – 2021-08-05 (×3): 180 mg via ORAL
  Filled 2021-08-03 (×3): qty 1

## 2021-08-03 MED ORDER — ASCORBIC ACID 500 MG PO TABS
500.0000 mg | ORAL_TABLET | Freq: Two times a day (BID) | ORAL | Status: DC
  Administered 2021-08-03 – 2021-08-05 (×5): 500 mg via ORAL
  Filled 2021-08-03 (×5): qty 1

## 2021-08-03 MED ORDER — SENNOSIDES-DOCUSATE SODIUM 8.6-50 MG PO TABS
1.0000 | ORAL_TABLET | Freq: Two times a day (BID) | ORAL | Status: DC
  Administered 2021-08-03 – 2021-08-05 (×5): 1 via ORAL
  Filled 2021-08-03 (×5): qty 1

## 2021-08-03 MED ORDER — OXCARBAZEPINE 300 MG PO TABS: 600.0000 mg | ORAL_TABLET | ORAL | Status: DC

## 2021-08-03 MED ORDER — OXYCODONE HCL ER 20 MG PO T12A
20.0000 mg | EXTENDED_RELEASE_TABLET | Freq: Two times a day (BID) | ORAL | Status: DC
  Administered 2021-08-03 – 2021-08-05 (×5): 20 mg via ORAL
  Filled 2021-08-03 (×5): qty 1

## 2021-08-03 MED ORDER — ARFORMOTEROL TARTRATE 15 MCG/2ML IN NEBU
15.0000 ug | INHALATION_SOLUTION | Freq: Two times a day (BID) | RESPIRATORY_TRACT | Status: DC
  Administered 2021-08-03 – 2021-08-05 (×3): 15 ug via RESPIRATORY_TRACT
  Filled 2021-08-03 (×5): qty 2

## 2021-08-03 MED ORDER — ACETAMINOPHEN 325 MG PO TABS
650.0000 mg | ORAL_TABLET | Freq: Four times a day (QID) | ORAL | Status: DC | PRN
  Administered 2021-08-03 – 2021-08-05 (×6): 650 mg via ORAL
  Filled 2021-08-03 (×7): qty 2

## 2021-08-03 MED ORDER — TAMSULOSIN HCL 0.4 MG PO CAPS
0.4000 mg | ORAL_CAPSULE | Freq: Every day | ORAL | Status: DC
  Administered 2021-08-03 – 2021-08-05 (×3): 0.4 mg via ORAL
  Filled 2021-08-03 (×3): qty 1

## 2021-08-03 MED ORDER — OXCARBAZEPINE 300 MG PO TABS
900.0000 mg | ORAL_TABLET | Freq: Every day | ORAL | Status: DC
Start: 2021-08-03 — End: 2021-08-06
  Administered 2021-08-03 – 2021-08-05 (×3): 900 mg via ORAL
  Filled 2021-08-03 (×3): qty 3

## 2021-08-03 MED ORDER — OXYCODONE HCL ER 10 MG PO T12A
10.0000 mg | EXTENDED_RELEASE_TABLET | Freq: Once | ORAL | Status: AC
  Administered 2021-08-03: 10 mg via ORAL
  Filled 2021-08-03: qty 1

## 2021-08-03 MED ORDER — ASPIRIN 81 MG PO TBEC
81.0000 mg | DELAYED_RELEASE_TABLET | Freq: Every day | ORAL | Status: DC
Start: 2021-08-03 — End: 2021-08-06
  Administered 2021-08-03 – 2021-08-05 (×3): 81 mg via ORAL
  Filled 2021-08-03 (×3): qty 1

## 2021-08-03 NOTE — Progress Notes (Signed)
Patient declines nocturnal CPAP use tonight. He is aware that he may call for assistance and a machine should he change his mind at ant time. RT will continue and encourage use.

## 2021-08-03 NOTE — H&P (Signed)
History and Physical    Patient: Mario [name]:[id number] DOB: 11-24-1950 DOA: 08/03/2021 DOS: the patient was seen and examined on 08/03/2021 PCP: [facility]  Patient coming from: Home (recently released from SNF after last hospitalization)  Chief Complaint:  Chief Complaint  Patient presents with   Shortness of Breath   HPI: Mario [name] is a 71 y.o. male with medical history significant of morbid obesity, HTN, DM, CKD, COPD, OSA and NSCLC (follows with Dr. Mckinley Jewel).  He wears 3L O2 at home.  He was recently hospitalized in May with enlarging pleural effusion.  He underwent thoracentesis and cytology showed metastatic carcinoma.  He was d/c'd to SNF and recently released from SNF to live with a "roommate".  Over the last few days he has noticed worsening dyspnea especially with exertion.  He also is c/o left lateral rib pain.    In the ER, patient had xray and CTA of his chest.  CT scan shows re-occurrence of the left pleural effusion.  ER MD consulted PCCM.  Patient unclear as of now if he would want repeat thoracentesis vs pleurx catheter.    Currently his only complaint is rib pain and is asking for oxycodone.  Not pleased with the 49m given by the ER MD.       Review of Systems: Unable to review all systems due to lack of cooperation from patient. Past Medical History:  Diagnosis Date   Anxiety    Bronchitis    COPD (chronic obstructive pulmonary disease) ([place]    Depression    History of radiation therapy    Left lung- 10/05/20-10/15/20- Dr. JGery Pray  Hypertension    lung ca 09/2020   Mental disorder    [place]    ????   OSA (obstructive sleep apnea)    [place]    Tension pneumothorax 06/27/2016   Uveitis    Past Surgical History:  Procedure Laterality Date   BIOPSY  07/03/2021   Procedure: BIOPSY;  Surgeon: BOtis Brace MD;  Location: [facility];  Service: Gastroenterology;;   BRONCHIAL BIOPSY   07/30/2020   Procedure: BRONCHIAL BIOPSIES;  Surgeon: IGarner Nash DO;  [facility];  Service: Pulmonary;;   BRONCHIAL BRUSHINGS  07/30/2020   Procedure: BRONCHIAL BRUSHINGS;  Surgeon: IGarner Nash DO;  [facility];  Service: Pulmonary;;   BRONCHIAL NEEDLE ASPIRATION BIOPSY  07/30/2020   Procedure: BRONCHIAL NEEDLE ASPIRATION BIOPSIES;  Surgeon: IGarner Nash DO;  [place] [facility]  Service: Pulmonary;;   BRONCHIAL WASHINGS  07/30/2020   Procedure: BRONCHIAL WASHINGS;  Surgeon: IGarner Nash DO;  [place] [facility]  Service: Pulmonary;;   CHEST TUBE INSERTION Left 06/27/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     ESOPHAGOGASTRODUODENOSCOPY N/A 07/03/2021   Procedure: ESOPHAGOGASTRODUODENOSCOPY (EGD);  Surgeon: BOtis Brace MD;  [facility];  Service: Gastroenterology;  Laterality: N/A;   SKIN CANCER EXCISION     VIDEO BRONCHOSCOPY WITH ENDOBRONCHIAL NAVIGATION Left 07/30/2020   Procedure: VIDEO BRONCHOSCOPY WITH ENDOBRONCHIAL NAVIGATION;  Surgeon: IGarner Nash DO;  [place] [facility]  Service: Pulmonary;  Laterality: Left;   Social History:  reports that he quit smoking about 5 years ago. His smoking use included cigarettes. He has a 35.00 pack-year smoking history. He has never used smokeless tobacco. He reports that he does not drink alcohol and does not use drugs.  Allergies  Allergen Reactions   Demerol [Meperidine] Nausea And Vomiting and Other (See Comments)  Made the patient "violently sick"   Zocor [Simvastatin] Nausea And Vomiting and Other (See Comments)    Made him very jittery, also    Family History  Problem Relation Age of Onset   Dementia Father     Prior to Admission medications   Medication Sig Start Date End Date Taking? Authorizing Provider  albuterol (PROVENTIL) (2.5 MG/3ML) 0.083% nebulizer solution Inhale 3 mLs (2.5 mg total) by nebulization every 4 (four) hours as needed for wheezing or shortness of  breath. Patient taking differently: Take 2.5 mg by nebulization every 6 (six) hours as needed for wheezing or shortness of breath. 08/04/20 08/04/21  Lavina Hamman, MD  albuterol (VENTOLIN HFA) 108 (90 Base) MCG/ACT inhaler Inhale 2 puffs into the lungs every 6 (six) hours as needed for wheezing. 06/02/20   [provider]  apixaban (ELIQUIS) 5 MG TABS tablet Take 5 mg by mouth 2 (two) times daily.    [provider]  aspirin 81 MG EC tablet Take 81 mg by mouth daily.    [provider]  b complex vitamins capsule Take 1 capsule by mouth daily.    [provider]  buPROPion (WELLBUTRIN XL) 300 MG 24 hr tablet Take 300 mg by mouth daily. 02/05/20   [provider]  busPIRone (BUSPAR) 15 MG tablet Take 15 mg by mouth daily.    [provider]  calcium-vitamin D (OSCAL WITH D) 500-200 MG-UNIT tablet Take 1 tablet by mouth 2 (two) times daily with a meal.    [provider]  carboxymethylcellulose (REFRESH PLUS) 0.5 % SOLN Place 1 drop into both eyes 4 (four) times daily as needed (dry eyes).    [provider]  Cholecalciferol (VITAMIN D-3) 25 MCG (1000 UT) CAPS Take 1,000 Units by mouth 2 (two) times daily.    [provider]  clobetasol ointment (TEMOVATE) 5.63 % Apply 1 application. topically 2 (two) times daily as needed (for scars).    [provider]  clotrimazole-betamethasone (LOTRISONE) cream Apply 1 application. topically 2 (two) times daily as needed (fungus). 07/06/21   Aline August, MD  cycloSPORINE (RESTASIS) 0.05 % ophthalmic emulsion Place 1 drop into both eyes 2 (two) times daily.    [provider]  diltiazem (CARDIZEM CD) 180 MG 24 hr capsule Take 180 mg by mouth daily.    [provider]  DULoxetine (CYMBALTA) 60 MG capsule Take 1 capsule (60 mg total) by mouth 2 (two) times daily. 07/21/17   Lavina Hamman, MD  feeding supplement (ENSURE ENLIVE / ENSURE PLUS) LIQD Take 237 mLs by  mouth 3 (three) times daily between meals. Patient taking differently: Take 1 Bottle by mouth daily. 08/04/20   Lavina Hamman, MD  fluticasone Asencion Islam) 50 MCG/ACT nasal spray Place 2 sprays into both nostrils daily.     [provider]  folic acid (FOLVITE) 1 MG tablet Take 1 mg by mouth daily.    [provider]  gabapentin (NEURONTIN) 300 MG capsule Take 1 capsule (300 mg total) by mouth 2 (two) times daily. 07/06/21   Aline August, MD  GARLIC OIL PO Take 1 capsule by mouth daily.    [provider]  guaiFENesin (MUCINEX) 600 MG 12 hr tablet Take 1 tablet (600 mg total) by mouth 2 (two) times daily as needed for cough. 07/06/21   Aline August, MD  lisinopril (ZESTRIL) 20 MG tablet Take 20 mg by mouth daily.    [provider]  lurasidone (LATUDA) 40  MG TABS tablet Take 40 mg by mouth daily after supper.    [provider]  Melatonin 3 MG TABS Take 6 mg by mouth at bedtime.    [provider]  metFORMIN (GLUCOPHAGE) 500 MG tablet Take 500 mg by mouth 2 (two) times daily with a meal.    [provider]  methocarbamol (ROBAXIN) 500 MG tablet Take 1 tablet (500 mg total) by mouth every 8 (eight) hours as needed for muscle spasms. 07/06/21   Aline August, MD  methotrexate (RHEUMATREX) 2.5 MG tablet Take 15 mg by mouth once a week.    [provider]  Mometasone Furoate 200 MCG/ACT AERO Inhale 2 puffs into the lungs at bedtime.    [provider]  mycophenolate (CELLCEPT) 250 MG capsule Take 1,500 mg by mouth 2 (two) times daily.    [provider]  nitroGLYCERIN (NITROSTAT) 0.4 MG SL tablet Place 1 tablet (0.4 mg total) under the tongue every 5 (five) minutes as needed for chest pain. Patient not taking: Reported on 06/14/2021 07/03/19   Edwin Dada, MD  Olodaterol HCl (STRIVERDI RESPIMAT) 2.5 MCG/ACT AERS Inhale 2 puffs into the lungs daily.    [provider]  OVER THE COUNTER MEDICATION Take  1 tablet by mouth at bedtime. Pure ZZZs sleep and destress    [provider]  oxcarbazepine (TRILEPTAL) 600 MG tablet Take 600-900 mg by mouth See admin instructions. Take 678m twice daily and 9047mat bedtime 07/16/20   [provider]  oxyCODONE-acetaminophen (PERCOCET/ROXICET) 5-325 MG tablet Take 1 tablet by mouth every 6 (six) hours as needed for severe pain. Patient not taking: Reported on 07/20/2021 07/06/21   AlAline AugustMD  OXYCONTIN 10 MG 12 hr tablet Take 10 mg by mouth daily. Patient not taking: Reported on 07/20/2021 07/16/21   [provider]  OXYGEN Inhale 3 L into the lungs at bedtime.    [provider]  pantoprazole (PROTONIX) 40 MG tablet Take 1 tablet (40 mg total) by mouth 2 (two) times daily. 07/06/21   AlAline AugustMD  Polyethyl Glycol-Propyl Glycol (SYSTANE OP) Place 1 drop into both eyes daily as needed (dry eye).    [provider]  polyethylene glycol (MIRALAX / GLYCOLAX) 17 g packet Take 17 g by mouth daily.    [provider]  PRESCRIPTION MEDICATION Inhale into the lungs See admin instructions. CPAP- At bedtime and during during any naps    [provider]  senna-docusate (SENOKOT-S) 8.6-50 MG tablet Take 1 tablet by mouth 2 (two) times daily. 07/06/21   AlAline AugustMD  sodium chloride (OCEAN) 0.65 % SOLN nasal spray Place 2 sprays into both nostrils 4 (four) times daily as needed for congestion.    [provider]  tamsulosin (FLOMAX) 0.4 MG CAPS capsule Take 0.4 mg by mouth daily.    [provider]  traMADol (ULTRAM) 50 MG tablet Take 2 tablets (100 mg total) by mouth at bedtime. 07/06/21   AlAline AugustMD  traZODone (DESYREL) 50 MG tablet Take 75 mg by mouth at bedtime.    [provider]  vitamin C (ASCORBIC ACID) 500 MG tablet Take 500 mg by mouth 2 (two) times daily.    [provider]    Physical Exam: Vitals:   08/03/21 1300 08/03/21 1330 08/03/21 1445  08/03/21 1607  BP: (!) 148/87 (!) 143/77 (!) 139/93 (!) 146/100  Pulse: (!) 110 (!) 108 (!) 108 (!) 109  Resp: 17 (!) 28 (!)  22 15  Temp:      TempSrc:      SpO2: 99% 100% 97% 95%  Weight:      Height:       Physical Exam Constitutional:      Appearance: He is obese.  HENT:     Mouth/Throat:     Mouth: Mucous membranes are moist.  Cardiovascular:     Rate and Rhythm: Normal rate.  Pulmonary:     Breath sounds: Decreased air movement present. Decreased breath sounds present. No wheezing.  Abdominal:     General: Bowel sounds are normal.  Skin:    General: Skin is warm and dry.  Neurological:     General: No focal deficit present.     Mental Status: He is alert.    Data Reviewed: Results for orders placed or performed during the hospital encounter of 08/03/21 (from the past 24 hour(s))  Basic metabolic panel     Status: Abnormal   Collection Time: 08/03/21 12:28 PM  Result Value Ref Range   Sodium 133 (L) 135 - 145 mmol/L   Potassium 4.7 3.5 - 5.1 mmol/L   Chloride 101 98 - 111 mmol/L   CO2 22 22 - 32 mmol/L   Glucose, Bld 142 (H) 70 - 99 mg/dL   BUN 9 8 - 23 mg/dL   Creatinine, Ser 0.88 0.61 - 1.24 mg/dL   Calcium 8.9 8.9 - 10.3 mg/dL   GFR, Estimated >60 >60 mL/min   Anion gap 10 5 - 15  CBC     Status: Abnormal   Collection Time: 08/03/21 12:28 PM  Result Value Ref Range   WBC 11.1 (H) 4.0 - 10.5 K/uL   RBC 4.22 4.22 - 5.81 MIL/uL   Hemoglobin 12.6 (L) 13.0 - 17.0 g/dL   HCT 39.6 39.0 - 52.0 %   MCV 93.8 80.0 - 100.0 fL   MCH 29.9 26.0 - 34.0 pg   MCHC 31.8 30.0 - 36.0 g/dL   RDW 14.6 11.5 - 15.5 %   Platelets 425 (H) 150 - 400 K/uL   nRBC 0.0 0.0 - 0.2 %  Troponin I (High Sensitivity)     Status: None   Collection Time: 08/03/21 12:28 PM  Result Value Ref Range   Troponin I (High Sensitivity) 6 <18 ng/L  Resp Panel by RT-PCR (Flu A&B, Covid) Anterior Nasal Swab     Status: None   Collection Time: 08/03/21  1:33 PM   Specimen: Anterior Nasal Swab   Result Value Ref Range   SARS Coronavirus 2 by RT PCR NEGATIVE NEGATIVE   Influenza A by PCR NEGATIVE NEGATIVE   Influenza B by PCR NEGATIVE NEGATIVE  Troponin I (High Sensitivity)     Status: None   Collection Time: 08/03/21  2:22 PM  Result Value Ref Range   Troponin I (High Sensitivity) 7 <18 ng/L  Blood gas, venous     Status: Abnormal   Collection Time: 08/03/21  2:22 PM  Result Value Ref Range   pH, Ven 7.38 7.25 - 7.43   pCO2, Ven 48 44 - 60 mmHg   pO2, Ven <31 (LL) 32 - 45 mmHg   Bicarbonate 28.4 (H) 20.0 - 28.0 mmol/L   Acid-Base Excess 2.5 (H) 0.0 - 2.0 mmol/L   O2 Saturation 38.8 %   Patient temperature 37.0   Hepatic function panel     Status: Abnormal   Collection Time: 08/03/21  2:22 PM  Result Value Ref Range   Total Protein 7.4  6.5 - 8.1 g/dL   Albumin 3.9 3.5 - 5.0 g/dL   AST 33 15 - 41 U/L   ALT 18 0 - 44 U/L   Alkaline Phosphatase 118 38 - 126 U/L   Total Bilirubin 1.0 0.3 - 1.2 mg/dL   Bilirubin, Direct 0.3 (H) 0.0 - 0.2 mg/dL   Indirect Bilirubin 0.7 0.3 - 0.9 mg/dL  Magnesium     Status: None   Collection Time: 08/03/21  2:22 PM  Result Value Ref Range   Magnesium 2.3 1.7 - 2.4 mg/dL     Assessment and Plan: 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  I offered him the option of palliative care and hospice referral versus consideration of systemic treatment based on the molecular studies and PD-L1 expression. The patient is still interested in some form of treatment.  I will wait for the molecular studies before making final decision regarding his treatment. -PCCM consult for ? Thoracentesis vs needing IR consult for pleurex -hold eliquis until decision made  Chronic  respiratory failure -continue 3L O2  Nondisplaced left anterior seventh rib fracture. -resume pain meds  Obesity Estimated body  mass index is 41.73 kg/m as calculated from the following:   Height as of this encounter: '6\' 2"'  (1.88 m).   Weight as of this encounter: 147.4 kg.   OSA -cpap QHS   Await medications to be verified by pharmacy     Advance Care Planning:   Code Status: Prior full  Consults: Pulm    Severity of Illness: The appropriate patient status for this patient is INPATIENT. Inpatient status is judged to be reasonable and necessary in order to provide the required intensity of service to ensure the patient's safety. The patient's presenting symptoms, physical exam findings, and initial radiographic and laboratory data in the context of their chronic comorbidities is felt to place them at high risk for further clinical deterioration. Furthermore, it is not anticipated that the patient will be medically stable for discharge from the hospital within 2 midnights of admission.   * I certify that at the point of admission it is my clinical judgment that the patient will require inpatient hospital care spanning beyond 2 midnights from the point of admission due to high intensity of service, high risk for further deterioration and high frequency of surveillance required.*  Author: Geradine Girt, DO 08/03/2021 4:15 PM  For on call review [email or web address].

## 2021-08-03 NOTE — ED Triage Notes (Signed)
Pt c/o worsening SOB over the last four days. Pt also c/o L sided rib pain. Pt normally on 3 lpm O2 via [place], but when fire dept arrived pt was 89% on home settings. Pt improved with NRB and placed on 4 lpm for transport. Pt endorses fall from standing approximately 8 days ago without head injury/LOC.

## 2021-08-03 NOTE — ED Provider Triage Note (Signed)
Emergency Medicine Provider Triage Evaluation Note  YUVAN MEDINGER , a 71 y.o. male  was evaluated in triage.  Pt complains of shortness of breath.  Pt has lung cancer  Review of Systems  Positive: Short of breath Negative: fever  Physical Exam  BP (!) 151/104 (BP Location: Left Arm)   Pulse (!) 119   Temp 98.4 F (36.9 C) (Oral)   Resp 20   Ht 6\' 2"  (1.88 m)   Wt (!) 147.4 kg   SpO2 98%   BMI 41.73 kg/m  Gen:   Awake, no distress   Resp:  Normal effort decreased breath sounds  MSK:   Moves extremities without difficulty  Other:    Medical Decision Making  Medically screening exam initiated at 12:10 PM.  Appropriate orders placed.  LAYMAN GULLY was informed that the remainder of the evaluation will be completed by another provider, this initial triage assessment does not replace that evaluation, and the importance of remaining in the ED until their evaluation is complete.     Fransico Meadow, [place] 08/03/21 1211

## 2021-08-03 NOTE — ED Provider Notes (Signed)
[name] DEPT Provider Note   CSN: [id number] Arrival date & time: 08/03/21  1112     History  Chief Complaint  Patient presents with   Shortness of Breath    Mario [name] is a 71 y.o. male.   Shortness of Breath Patient presenting for shortness of breath.  Medical history includes HTN, DM2, CKD, COPD, history of pneumothorax, depression, OSA, NSCLC (diagnosed 1 year ago) s/p SBRT to left lower lobe, and obesity.  His oncologist is Dr. Julien Nordmann.  He had a recent admission 1 month ago for enlarging right pleural effusion.  He underwent thoracentesis and cytology showed metastatic adenocarcinoma.  During his hospitalization, he did have GI bleeding requiring PRBC transfusion.  He was started back on his Eliquis at time of discharge.  He is on home oxygen (3 L) 24/7.  He does continue to take his Eliquis.  He denies any recent bleeding.  Patient returned from rehab 1.5 weeks ago.  Currently, he lives with a roommate who also has severe health problems.  Patient reports that he has minimal assistance at home with ADLs.  He has noticed severe shortness of breath with minimal exertion over the past several days.  For this reason, he presents to the ED.  He has had left lower lateral rib pain that he attributes to a rib injury while he was in rehab.  Pain is worsened with palpation, movement, and deep inspiration.  He denies any other areas of discomfort.    Home Medications Prior to Admission medications   Medication Sig Start Date End Date Taking? Authorizing Provider  albuterol (PROVENTIL) (2.5 MG/3ML) 0.083% nebulizer solution Inhale 3 mLs (2.5 mg total) by nebulization every 4 (four) hours as needed for wheezing or shortness of breath. Patient taking differently: Take 2.5 mg by nebulization every 6 (six) hours as needed for wheezing or shortness of breath. 08/04/20 08/04/21  Lavina Hamman, MD  albuterol (VENTOLIN HFA) 108 (90 Base) MCG/ACT inhaler Inhale 2  puffs into the lungs every 6 (six) hours as needed for wheezing. 06/02/20   [provider]  apixaban (ELIQUIS) 5 MG TABS tablet Take 5 mg by mouth 2 (two) times daily.    [provider]  aspirin 81 MG EC tablet Take 81 mg by mouth daily.    [provider]  b complex vitamins capsule Take 1 capsule by mouth daily.    [provider]  buPROPion (WELLBUTRIN XL) 300 MG 24 hr tablet Take 300 mg by mouth daily. 02/05/20   [provider]  busPIRone (BUSPAR) 15 MG tablet Take 15 mg by mouth daily.    [provider]  calcium-vitamin D (OSCAL WITH D) 500-200 MG-UNIT tablet Take 1 tablet by mouth 2 (two) times daily with a meal.    [provider]  carboxymethylcellulose (REFRESH PLUS) 0.5 % SOLN Place 1 drop into both eyes 4 (four) times daily as needed (dry eyes).    [provider]  Cholecalciferol (VITAMIN D-3) 25 MCG (1000 UT) CAPS Take 1,000 Units by mouth 2 (two) times daily.    [provider]  clobetasol ointment (TEMOVATE) 0.86 % Apply 1 application. topically 2 (two) times daily as needed (for scars).    [provider]  clotrimazole-betamethasone (LOTRISONE) cream Apply 1 application. topically 2 (two) times daily as needed (fungus). 07/06/21   Aline August, MD  cycloSPORINE (RESTASIS) 0.05 % ophthalmic emulsion Place 1 drop into both eyes 2 (two) times daily.  [provider]  diltiazem (CARDIZEM CD) 180 MG 24 hr capsule Take 180 mg by mouth daily.    [provider]  DULoxetine (CYMBALTA) 60 MG capsule Take 1 capsule (60 mg total) by mouth 2 (two) times daily. 07/21/17   Lavina Hamman, MD  feeding supplement (ENSURE ENLIVE / ENSURE PLUS) LIQD Take 237 mLs by mouth 3 (three) times daily between meals. Patient taking differently: Take 1 Bottle by mouth daily. 08/04/20   Lavina Hamman, MD  fluticasone Asencion Islam) 50 MCG/ACT nasal spray Place 2 sprays into both nostrils daily.     [provider]  folic acid (FOLVITE) 1 MG tablet Take 1 mg by mouth daily.    [provider]  gabapentin (NEURONTIN) 300 MG capsule Take 1 capsule (300 mg total) by mouth 2 (two) times daily. 07/06/21   Aline August, MD  GARLIC OIL PO Take 1 capsule by mouth daily.    [provider]  guaiFENesin (MUCINEX) 600 MG 12 hr tablet Take 1 tablet (600 mg total) by mouth 2 (two) times daily as needed for cough. 07/06/21   Aline August, MD  lisinopril (ZESTRIL) 20 MG tablet Take 20 mg by mouth daily.    [provider]  lurasidone (LATUDA) 40 MG TABS tablet Take 40 mg by mouth daily after supper.    [provider]  Melatonin 3 MG TABS Take 6 mg by mouth at bedtime.    [provider]  metFORMIN (GLUCOPHAGE) 500 MG tablet Take 500 mg by mouth 2 (two) times daily with a meal.    [provider]  methocarbamol (ROBAXIN) 500 MG tablet Take 1 tablet (500 mg total) by mouth every 8 (eight) hours as needed for muscle spasms. 07/06/21   Aline August, MD  methotrexate (RHEUMATREX) 2.5 MG tablet Take 15 mg by mouth once a week.    [provider]  Mometasone Furoate 200 MCG/ACT AERO Inhale 2 puffs into the lungs at bedtime.    [provider]  mycophenolate (CELLCEPT) 250 MG capsule Take 1,500 mg by mouth 2 (two) times daily.    [provider]  nitroGLYCERIN (NITROSTAT) 0.4 MG SL tablet Place 1 tablet (0.4 mg total) under the tongue every 5 (five) minutes as needed for chest pain. Patient not taking: Reported on 06/14/2021 07/03/19   Edwin Dada, MD  Olodaterol HCl (STRIVERDI RESPIMAT) 2.5 MCG/ACT AERS Inhale 2 puffs into the lungs daily.    [provider]  OVER THE COUNTER MEDICATION Take 1 tablet by mouth at bedtime. Pure ZZZs sleep and destress    [provider]  oxcarbazepine (TRILEPTAL) 600 MG tablet Take 600-900 mg by mouth See admin instructions. Take 600mg  twice daily and 900mg  at bedtime 07/16/20    [provider]  oxyCODONE-acetaminophen (PERCOCET/ROXICET) 5-325 MG tablet Take 1 tablet by mouth every 6 (six) hours as needed for severe pain. Patient not taking: Reported on 07/20/2021 07/06/21   Aline August, MD  OXYCONTIN 10 MG 12 hr tablet Take 10 mg by mouth daily. Patient not taking: Reported on 07/20/2021 07/16/21   [provider]  OXYGEN Inhale 3 L into the lungs at bedtime.    [provider]  pantoprazole (PROTONIX) 40 MG tablet Take 1 tablet (40 mg total) by mouth 2 (two) times daily. 07/06/21   Aline August, MD  Polyethyl Glycol-Propyl Glycol (SYSTANE OP) Place 1 drop into both eyes daily as needed (dry eye).    [provider]  polyethylene  glycol (MIRALAX / GLYCOLAX) 17 g packet Take 17 g by mouth daily.    [provider]  PRESCRIPTION MEDICATION Inhale into the lungs See admin instructions. CPAP- At bedtime and during during any naps    [provider]  senna-docusate (SENOKOT-S) 8.6-50 MG tablet Take 1 tablet by mouth 2 (two) times daily. 07/06/21   Aline August, MD  sodium chloride (OCEAN) 0.65 % SOLN nasal spray Place 2 sprays into both nostrils 4 (four) times daily as needed for congestion.    [provider]  tamsulosin (FLOMAX) 0.4 MG CAPS capsule Take 0.4 mg by mouth daily.    [provider]  traMADol (ULTRAM) 50 MG tablet Take 2 tablets (100 mg total) by mouth at bedtime. 07/06/21   Aline August, MD  traZODone (DESYREL) 50 MG tablet Take 75 mg by mouth at bedtime.    [provider]  vitamin C (ASCORBIC ACID) 500 MG tablet Take 500 mg by mouth 2 (two) times daily.    [provider]      Allergies    Demerol [meperidine] and Zocor [simvastatin]    Review of Systems   Review of Systems  Constitutional:  Positive for fatigue.  Respiratory:  Positive for shortness of breath.   Cardiovascular:        Left lower lateral chest wall pain  All other systems reviewed and are  negative.  Physical Exam Updated Vital Signs BP (!) 139/93   Pulse (!) 108   Temp 98.4 F (36.9 C) (Oral)   Resp (!) 22   Ht 6\' 2"  (1.88 m)   Wt (!) 147.4 kg   SpO2 97%   BMI 41.73 kg/m  Physical Exam Vitals and nursing note reviewed.  Constitutional:      General: He is not in acute distress.    Appearance: He is well-developed. He is not ill-appearing, toxic-appearing or diaphoretic.  HENT:     Head: Normocephalic and atraumatic.     Mouth/Throat:     Mouth: Mucous membranes are moist.  Eyes:     Extraocular Movements: Extraocular movements intact.     Conjunctiva/sclera: Conjunctivae normal.  Cardiovascular:     Rate and Rhythm: Regular rhythm. Tachycardia present.     Heart sounds: No murmur heard. Pulmonary:     Effort: Pulmonary effort is normal. No respiratory distress.     Breath sounds: Examination of the right-lower field reveals decreased breath sounds. Examination of the left-lower field reveals decreased breath sounds. Decreased breath sounds present. No wheezing or rales.     Comments: Able to speak in complete sentences. Chest:     Chest wall: Tenderness present. No edema.  Abdominal:     Palpations: Abdomen is soft.     Tenderness: There is no abdominal tenderness.  Musculoskeletal:        General: No swelling.     Cervical back: Normal range of motion and neck supple.     Right lower leg: Edema present.     Left lower leg: Edema present.  Skin:    General: Skin is warm and dry.     Capillary Refill: Capillary refill takes less than 2 seconds.  Neurological:     General: No focal deficit present.     Mental Status: He is alert and oriented to person, place, and time.     Cranial Nerves: No cranial nerve deficit.     Motor: No weakness.  Psychiatric:        Mood and Affect: Mood  normal.        Behavior: Behavior normal.    ED Results / Procedures / Treatments   Labs (all labs ordered are listed, but only abnormal results are displayed) Labs  Reviewed  BASIC METABOLIC PANEL - Abnormal; Notable for the following components:      Result Value   Sodium 133 (*)    Glucose, Bld 142 (*)    All other components within normal limits  CBC - Abnormal; Notable for the following components:   WBC 11.1 (*)    Hemoglobin 12.6 (*)    Platelets 425 (*)    All other components within normal limits  BLOOD GAS, VENOUS - Abnormal; Notable for the following components:   pO2, Ven <31 (*)    Bicarbonate 28.4 (*)    Acid-Base Excess 2.5 (*)    All other components within normal limits  HEPATIC FUNCTION PANEL - Abnormal; Notable for the following components:   Bilirubin, Direct 0.3 (*)    All other components within normal limits  RESP PANEL BY RT-PCR (FLU A&B, COVID) ARPGX2  MAGNESIUM  TROPONIN I (HIGH SENSITIVITY)  TROPONIN I (HIGH SENSITIVITY)    EKG EKG Interpretation  Date/Time:  Tuesday August 03 2021 11:17:21 EDT Ventricular Rate:  118 PR Interval:  166 QRS Duration: 116 QT Interval:  324 QTC Calculation: 454 R Axis:   23 Text Interpretation: Sinus tachycardia Nonspecific intraventricular conduction delay Low voltage, extremity and precordial leads Baseline wander in lead(s) V5 Confirmed by Godfrey Pick [phone]) on 08/03/2021 1:03:29 PM  Radiology DG Chest 2 View  Result Date: 08/03/2021 CLINICAL DATA:  Shortness of breath. EXAM: CHEST - 2 VIEW COMPARISON:  07/04/2021 FINDINGS: Asymmetric elevation left hemidiaphragm with left base collapse/consolidation and probable effusion. Bleb at the left base is stable in the interval. The cardio pericardial silhouette is enlarged. Telemetry leads overlie the chest. IMPRESSION: Asymmetric elevation left hemidiaphragm with left base collapse/consolidation and probable effusion. Electronically Signed   By: Misty Stanley M.D.   On: 08/03/2021 11:49   CT Angio Chest PE W and/or Wo Contrast  Result Date: 08/03/2021 CLINICAL DATA:  Pulmonary embolism (PE) suspected, high prob Worsening shortness of  breath over the last 4 days. Left-sided chest pain. Patient with history of lung cancer. EXAM: CT ANGIOGRAPHY CHEST WITH CONTRAST TECHNIQUE: Multidetector CT imaging of the chest was performed using the standard protocol during bolus administration of intravenous contrast. Multiplanar CT image reconstructions and MIPs were obtained to evaluate the vascular anatomy. RADIATION DOSE REDUCTION: This exam was performed according to the departmental dose-optimization program which includes automated exposure control, adjustment of the mA and/or kV according to patient size and/or use of iterative reconstruction technique. CONTRAST:  79mL OMNIPAQUE IOHEXOL 350 MG/ML SOLN COMPARISON:  Radiograph earlier today.  Chest CT 06/23/2021 FINDINGS: Cardiovascular: There are no filling defects within the pulmonary arteries to suggest pulmonary embolus. Portions of the left lower lobe subsegmental branches are attenuated and not well assessed. Additionally there is motion artifact obscuring the bases. Atherosclerosis of the thoracic aorta. No dissection or acute aortic findings. Coronary artery calcifications. The heart is normal in size. No pericardial effusion. Mediastinum/Nodes: Loculated pleural fluid tracks along the left aspect of the mediastinum. No enlarged mediastinal or hilar lymph nodes. No axillary or supraclavicular adenopathy. Lungs/Pleura: Partially loculated left pleural effusion has progressed in size from CT 6 weeks ago, now moderate to large. Loculated component tracks anteriorly at the lung base and medially adjacent to the mediastinum. A bulla at the left lung  base measures 6.4 cm, decreased in size from prior exam when it measured 8 cm, and is elevated related to adjacent pleural effusion. There is associated airspace disease and/or atelectasis in the left lower and left upper lobe. Loculated fluid tracks into the inter lobar fissure on the left. Areas of pleural enhancement in the basilar hemithorax, series 5,  image 123 and 128. Underlying emphysema. Bulla at the right lung base are unchanged. No acute right lung airspace disease. No right pleural effusion. Suspected basilar linear atelectasis, although obscured by motion. Upper Abdomen: Limited assessment of the upper abdomen demonstrates no acute findings. Musculoskeletal: There is a nondisplaced fracture of the left anterior seventh rib. No evidence of underlying lesion. Thoracic spondylosis. Review of the MIP images confirms the above findings. IMPRESSION: 1. No pulmonary embolus. 2. Moderate to large partially loculated left pleural effusion has progressed in size from CT 6 weeks ago. Loculated component tracks anteriorly at the lung base and medially adjacent to the mediastinum. There is associated airspace disease and/or atelectasis in the left lower and left upper lobe. A bulla at the left lung base has decreased in size from prior exam, and is elevated related to adjacent pleural fluid. Areas of pleural enhancement in the basilar hemithorax raises concern for underlying pleural based metastatic disease. 3. Nondisplaced left anterior seventh rib fracture. 4. Emphysema.  Stable size of right basilar bulla. 5. Aortic atherosclerosis.  Coronary artery calcifications. Aortic Atherosclerosis (ICD10-I70.0) and Emphysema (ICD10-J43.9). Electronically Signed   By: Keith Rake M.D.   On: 08/03/2021 15:24    Procedures Procedures    Medications Ordered in ED Medications  oxyCODONE (OXYCONTIN) 12 hr tablet 10 mg (has no administration in time range)  iohexol (OMNIPAQUE) 350 MG/ML injection 75 mL (75 mLs Intravenous Contrast Given 08/03/21 1451)    ED Course/ Medical Decision Making/ A&P                           Medical Decision Making Amount and/or Complexity of Data Reviewed Labs: ordered. Radiology: ordered.  Risk Prescription drug management.   This patient presents to the ED for concern of shortness of breath, this involves an extensive number  of treatment options, and is a complaint that carries with it a high risk of complications and morbidity.  The differential diagnosis includes worsening of known lung cancer, recurrence of pleural effusion, pneumonia, PE, CHF, COPD   Co morbidities that complicate the patient evaluation  HTN, DM2, CKD, COPD, history of pneumothorax, depression, OSA, NSCLC (diagnosed 1 year ago) s/p SBRT to left lower lobe, and obesity   Additional history obtained:  Additional history obtained from N/A External records from outside source obtained and reviewed including EMR   Lab Tests:  I Ordered, and personally interpreted labs.  The pertinent results include: Baseline anemia, mild leukocytosis, normal blood gas, normal electrolytes   Imaging Studies ordered:  I ordered imaging studies including chest x-ray, CTA chest I independently visualized and interpreted imaging which showed recurrence of left-sided pleural effusion I agree with the radiologist interpretation   Cardiac Monitoring: / EKG:  The patient was maintained on a cardiac monitor.  I personally viewed and interpreted the cardiac monitored which showed an underlying rhythm of: Sinus rhythm   Consultations Obtained:  I requested consultation with the pulmonology,  and discussed lab and imaging findings as well as pertinent plan - they recommend: Further discussions with patient to determine if he would want a repeat paracentesis versus  Pleurx catheter.  A Pleurx catheter, IR to be consulted.   Problem List / ED Course / Critical interventions / Medication management  Patient is a 71 year old male presenting for shortness of breath and exercise intolerance.  He reports an acute worsening of the symptoms over the past several days.  He has known metastatic adenocarcinoma and did undergo a paracentesis 6 weeks ago.  Following his hospitalization, he did reside in a rehab facility.  He returned home a week and a half ago.  On arrival in  the ED, patient is able to maintain normal SPO2 on his baseline 3 L of oxygen.  He has mild increased work of breathing but is able to complete sentences.  He does become dyspneic with even minimal exertion.  Vital signs are otherwise notable for tachycardia.  Laboratory work-up was initiated.  Patient underwent chest x-ray which did show elevation of the left hemidiaphragm.  CTA of chest was ordered.  Patient did not have any PE but CT scan did further characterize what appears to be a recurrence of the left malignant pleural effusion.  On reassessment, patient has no worsening of symptoms.  He did request his home dose of long-acting oxycodone for treatment of his left-sided chest wall pain.  This was ordered.  I spoke with pulmonologist on-call who recommended further discussions with the patient to see if he would want a Pleurx catheter.  If so, they recommend IR consultation.  Patient is on Eliquis and his last dose was last night.  Patient is in favor of admission to hospital.  Patient was admitted to hospitalist for further management. I ordered medication including oxycodone for left-sided chest wall pain Reevaluation of the patient after these medicines showed that the patient improved I have reviewed the patients home medicines and have made adjustments as needed   Social Determinants of Health:  Currently living at home with roommate who is unable to help him with ADLs.  Patient has limited mobility and is unable to care for himself with his current exercise intolerance.        Final Clinical Impression(s) / ED Diagnoses Final diagnoses:  SOB (shortness of breath)  Exercise intolerance  Malignant pleural effusion    Rx / DC Orders ED Discharge Orders     None         Godfrey Pick, MD 08/03/21 [phone]

## 2021-08-03 NOTE — ED Notes (Signed)
Pt given 2 apple sauces and water.

## 2021-08-03 NOTE — Consult Note (Signed)
NAME:  Mario [name], MRN:  [id number], DOB:  [birth year], LOS: 0 ADMISSION DATE:  08/03/2021, CONSULTATION DATE:  08/03/2021  REFERRING MD:  Jinny Sanders, CHIEF COMPLAINT:  shortness of breath   History of Present Illness:  71 year old smoker with COPD on 3 L home oxygen and left malignant effusion admitted with shortness of breath for 4 days and left-sided chest pain. 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  He follows up with [facility].  He was admitted 05/2021 with left  pleural effusion, thoracentesis showed metastatic adenocarcinoma.  Course was complicated by upper GI bleeding requiring transfusion.  He was discharged to rehab and eventually home he established with Dr. Julien Nordmann and chemotherapy is being considered but it was noted that his functional status is poor and it is likely he would not tolerate chemoradiation. During his previous admission Pleurx was deferred since it was found that he did not have symptomatic benefit from thoracentesis CT angiogram showed moderate to large partially loculated left effusion has progressed in size.  There were areas of pleural enhancement raising concern for metastatic disease.  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),  PE 01/2021  Significant Hospital Events: Including procedures, antibiotic start and stop dates in addition to other pertinent events   4/28 CT chest completed post thoracentesis 4/26 which revealed improvement in consolidation and effusion of left lung base, and severe centrilobular emphysema of upper lungs  Interim History / Subjective:  Sitting up in ED stretcher, no distress able to speak in full sentences Complains of pain, left chest axillary line and requests  oxycodone  He reports rib pain during his rehab stay when he got twisted in bed and had to lean on the guardrail  Objective   Blood pressure (!) 139/93, pulse (!) 108, temperature 98.4 F (36.9 C), temperature source Oral, resp. rate (!) 22, height _0  (1.88 m), weight (!) 147.4 kg, SpO2 97 %.       No intake or output data in the 24 hours ending 08/03/21 1604 Filed Weights   08/03/21 1116  Weight: (!) 147.4 kg    Examination: General: Elderly adult well-built man, sitting up in bed no distress HENT: Mild pallor, no icterus, no JVD no lymphadenopathy Lungs: Decreased breath sounds left base, no rhonchi no accessory muscle use Cardiovascular: S1-S2 regular, mild tacky, no murmur Abdomen: Soft, nontender Extremities: No edema Neuro: Alert, interactive, nonfocal   Resolved Hospital Problem list     Assessment & Plan:  Recurrent left malignant effusion -Seems to have recurred after thoracenteses 4/26 -I discussed with him options of repeat thoracentesis versus Pleurx catheter.  He would be willing for Pleurx catheter placement.  Given loculated nature of effusion, would defer to IR -He will need to be set up with nursing services for drainage of Pleurx catheter at least once every week -Oncology to decide about treatment based on results of markers and pleural fluid/blood  Rib fracture/left musculoskeletal pain -Would use a combination of narcotics and nonnarcotic NSAIDs   Outpatient follow-up can be with oncology. PCCM will be available as needed.  Please ensure that nursing services are set up for drainage of pleurx catheter once a week on discharge   Best Practice (right click and "Reselect all SmartList Selections" daily)   Per primary  Labs   CBC: Recent Labs  Lab 08/03/21 1228  WBC 11.1*  HGB 12.6*  HCT 39.6  MCV 93.8  PLT 425*    Basic Metabolic Panel: Recent Labs  Lab 08/03/21 1228 08/03/21 1422  NA 133*  --   K 4.7  --   CL 101  --   CO2 22   --   GLUCOSE 142*  --   BUN 9  --   CREATININE 0.88  --   CALCIUM 8.9  --   MG  --  2.3   GFR: Estimated Creatinine Clearance: 121.4 mL/min (by C-G formula based on SCr of 0.88 mg/dL). Recent Labs  Lab 08/03/21 1228  WBC 11.1*    Liver Function Tests: Recent Labs  Lab 08/03/21 1422  AST 33  ALT 18  ALKPHOS 118  BILITOT 1.0  PROT 7.4  ALBUMIN 3.9   No results for input(s): LIPASE, AMYLASE in the last 168 hours. No results for input(s): AMMONIA in the last 168 hours.  ABG    Component Value Date/Time   PHART 7.47 (H) 06/04/2021 0005   PCO2ART 37 06/04/2021 0005   PO2ART 70 (L) 06/04/2021 0005   HCO3 28.4 (H) 08/03/2021 1422   TCO2 25 06/16/2021 2149   ACIDBASEDEF 3.3 (H) 06/04/2021 0810   O2SAT 38.8 08/03/2021 1422     Coagulation Profile: No results for input(s): INR, PROTIME in the last 168 hours.  Cardiac Enzymes: No results for input(s): CKTOTAL, CKMB, CKMBINDEX, TROPONINI in the last 168 hours.  HbA1C: Hgb A1c MFr Bld  Date/Time Value Ref Range Status  05/22/2021 03:35 AM 7.6 (H) 4.8 - 5.6 % Final    Comment:    (NOTE) Pre diabetes:          5.7%-6.4%  Diabetes:              >6.4%  Glycemic control for   <7.0% adults with diabetes   01/09/2021 07:56 AM 7.0 (H) 4.8 - 5.6 % Final    Comment:    (NOTE) Pre diabetes:          5.7%-6.4%  Diabetes:              >6.4%  Glycemic control for   <7.0% adults with diabetes     CBG: No results for input(s): GLUCAP in the last 168 hours.  Review of Systems:   Shortness of breath Left-sided chest pain   Constitutional: negative for anorexia, fevers and sweats  Eyes: negative for irritation, redness and visual disturbance  Ears, nose, mouth, throat, and face: negative for earaches, epistaxis, nasal congestion and sore throat  Respiratory: negative for cough, sputum and wheezing  Cardiovascular: negative for  lower extremity edema, orthopnea, palpitations and syncope  Gastrointestinal: negative  for abdominal pain, constipation, diarrhea, melena, nausea and vomiting  Genitourinary:negative for dysuria, frequency and hematuria  Hematologic/lymphatic: negative for bleeding, easy bruising and lymphadenopathy  Musculoskeletal:negative for arthralgias, muscle weakness and stiff joints  Neurological: negative for coordination problems, gait problems, headaches and weakness  Endocrine: negative for diabetic symptoms including polydipsia, polyuria and weight loss   Past Medical History:  He,  has a past medical history of Anxiety, Bronchitis, COPD (chronic obstructive pulmonary disease) ([place]), Depression, History of radiation therapy, Hypertension, lung ca (09/2020), Mental disorder, MI (myocardial infarction) ([place]), OSA (obstructive sleep apnea), Suicide attempt ([place]), Tension pneumothorax (06/27/2016), and Uveitis.   Surgical History:   Past Surgical History:  Procedure Laterality Date   BIOPSY  07/03/2021   Procedure: BIOPSY;  Surgeon: Otis Brace, MD;  Location: [name];  Service: Gastroenterology;;   BRONCHIAL BIOPSY  07/30/2020   Procedure: BRONCHIAL BIOPSIES;  Surgeon: Garner Nash, DO;  Location: [facility];  Service: Pulmonary;;   BRONCHIAL BRUSHINGS  07/30/2020   Procedure: BRONCHIAL BRUSHINGS;  Surgeon: Garner Nash, DO;  Location: [facility];  Service: Pulmonary;;   BRONCHIAL NEEDLE ASPIRATION BIOPSY  07/30/2020   Procedure: BRONCHIAL NEEDLE ASPIRATION BIOPSIES;  Surgeon: Garner Nash, DO;  Location: [facility];  Service: Pulmonary;;   BRONCHIAL WASHINGS  07/30/2020   Procedure: BRONCHIAL WASHINGS;  Surgeon: Garner Nash, DO;  Location: [facility];  Service: Pulmonary;;   CHEST TUBE INSERTION Left 06/27/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     ESOPHAGOGASTRODUODENOSCOPY N/A 07/03/2021   Procedure: ESOPHAGOGASTRODUODENOSCOPY (EGD);  Surgeon: Otis Brace, MD;  Location: [name];  Service: Gastroenterology;  Laterality: N/A;   SKIN CANCER EXCISION     VIDEO  BRONCHOSCOPY WITH ENDOBRONCHIAL NAVIGATION Left 07/30/2020   Procedure: VIDEO BRONCHOSCOPY WITH ENDOBRONCHIAL NAVIGATION;  Surgeon: Garner Nash, DO;  Location: [facility];  Service: Pulmonary;  Laterality: Left;     Social History:   reports that he quit smoking about 5 years ago. His smoking use included cigarettes. He has a 35.00 pack-year smoking history. He has never used smokeless tobacco. He reports that he does not drink alcohol and does not use drugs.   Family History:  His family history includes Dementia in his father.   Allergies Allergies  Allergen Reactions   Demerol [Meperidine] Nausea And Vomiting and Other (See Comments)    Made the patient "violently sick"   Zocor [Simvastatin] Nausea And Vomiting and Other (See Comments)    Made him very jittery, also     Home Medications  Prior to Admission medications   Medication Sig Start Date End Date Taking? Authorizing Provider  albuterol (PROVENTIL) (2.5 MG/3ML) 0.083% nebulizer solution Inhale 3 mLs (2.5 mg total) by nebulization every 4 (four) hours as needed for wheezing or shortness of breath. Patient taking differently: Take 2.5 mg by nebulization every 6 (six) hours as needed for wheezing or shortness of breath. 08/04/20 08/04/21  Lavina Hamman, MD  albuterol (VENTOLIN HFA) 108 (90 Base) MCG/ACT inhaler Inhale 2 puffs into the lungs every 6 (six) hours as needed for wheezing. 06/02/20   [provider]  apixaban (ELIQUIS) 5 MG TABS tablet Take 5 mg by mouth 2 (two) times daily.    [provider]  aspirin 81 MG EC tablet Take 81 mg by mouth daily.    [provider]  b complex vitamins capsule Take 1 capsule by mouth daily.    [provider]  buPROPion (WELLBUTRIN XL) 300 MG 24 hr tablet Take 300 mg by mouth daily. 02/05/20   [provider]  busPIRone (BUSPAR) 15 MG tablet Take 15 mg by mouth daily.    [provider]  calcium-vitamin D (OSCAL WITH D) 500-200 MG-UNIT  tablet Take 1 tablet by mouth 2 (two) times daily with a meal.    [provider]  carboxymethylcellulose (REFRESH PLUS) 0.5 % SOLN Place 1 drop into both eyes 4 (four) times daily as needed (dry eyes).    [provider]  Cholecalciferol (VITAMIN D-3) 25 MCG (1000 UT) CAPS Take 1,000 Units by mouth 2 (two) times daily.    [provider]  clobetasol ointment (TEMOVATE) 0.16 % Apply 1 application. topically 2 (two) times daily as needed (for scars).    [provider]  clotrimazole-betamethasone (LOTRISONE) cream Apply 1 application. topically 2 (two) times daily as needed (fungus). 07/06/21   Aline August, MD  cycloSPORINE (RESTASIS) 0.05 % ophthalmic emulsion Place 1 drop into both eyes 2 (two) times daily.    [provider]  diltiazem (CARDIZEM CD) 180 MG 24 hr capsule Take 180 mg by mouth daily.    [provider]  DULoxetine (CYMBALTA) 60 MG capsule Take 1 capsule (60 mg total) by mouth 2 (two) times daily. 07/21/17   Lavina Hamman, MD  feeding supplement (ENSURE ENLIVE / ENSURE PLUS) LIQD Take 237 mLs by mouth 3 (three) times daily between meals. Patient taking differently: Take 1 Bottle by mouth daily. 08/04/20   Lavina Hamman, MD  fluticasone Asencion Islam) 50 MCG/ACT nasal spray Place 2 sprays into both nostrils daily.     [provider]  folic acid (FOLVITE) 1 MG tablet Take 1 mg by mouth daily.    [provider]  gabapentin (NEURONTIN) 300 MG capsule Take 1 capsule (300 mg total) by mouth 2 (two) times daily. 07/06/21   Aline August, MD  GARLIC OIL PO Take 1 capsule by mouth daily.    [provider]  guaiFENesin (MUCINEX) 600 MG 12 hr tablet Take 1 tablet (600 mg total) by mouth 2 (two) times daily as needed for cough. 07/06/21   Aline August, MD  lisinopril (ZESTRIL) 20 MG tablet Take 20 mg by mouth daily.    [provider]  lurasidone (LATUDA) 40 MG TABS tablet Take 40 mg by mouth daily after  supper.    [provider]  Melatonin 3 MG TABS Take 6 mg by mouth at bedtime.    [provider]  metFORMIN (GLUCOPHAGE) 500 MG tablet Take 500 mg by mouth 2 (two) times daily with a meal.    [provider]  methocarbamol (ROBAXIN) 500 MG tablet Take 1 tablet (500 mg total) by mouth every 8 (eight) hours as needed for muscle spasms. 07/06/21   Aline August, MD  methotrexate (RHEUMATREX) 2.5 MG tablet Take 15 mg by mouth once a week.    [provider]  Mometasone Furoate 200 MCG/ACT AERO Inhale 2 puffs into the lungs at bedtime.    [provider]  mycophenolate (CELLCEPT) 250 MG capsule Take 1,500 mg by mouth 2 (two) times daily.    [provider]  nitroGLYCERIN (NITROSTAT) 0.4 MG SL tablet Place 1 tablet (0.4 mg total) under the tongue every 5 (five) minutes as needed for chest pain. Patient not taking: Reported on 06/14/2021 07/03/19   Edwin Dada, MD  Olodaterol HCl (STRIVERDI RESPIMAT) 2.5 MCG/ACT AERS Inhale 2 puffs into the lungs daily.    [provider]  OVER THE COUNTER MEDICATION Take 1 tablet by mouth at bedtime. Pure ZZZs sleep and destress    [provider]  oxcarbazepine (TRILEPTAL) 600 MG tablet Take 600-900 mg by mouth See admin instructions. Take 610m twice daily and 9017mat bedtime 07/16/20   [provider]  oxyCODONE-acetaminophen (PERCOCET/ROXICET) 5-325 MG tablet Take 1 tablet by mouth every 6 (six) hours as needed for severe pain. Patient not taking: Reported on 07/20/2021 07/06/21   AlAline AugustMD  OXYCONTIN 10 MG 12 hr tablet Take 10 mg by mouth daily. Patient not taking: Reported on 07/20/2021 07/16/21   [provider]  OXYGEN Inhale 3 L into the lungs at bedtime.    [provider]  pantoprazole (PROTONIX) 40 MG tablet Take 1 tablet (40  mg total) by mouth 2 (two) times daily. 07/06/21   Aline August, MD  Polyethyl Glycol-Propyl Glycol (SYSTANE OP) Place 1 drop  into both eyes daily as needed (dry eye).    [provider]  polyethylene glycol (MIRALAX / GLYCOLAX) 17 g packet Take 17 g by mouth daily.    [provider]  PRESCRIPTION MEDICATION Inhale into the lungs See admin instructions. CPAP- At bedtime and during during any naps    [provider]  senna-docusate (SENOKOT-S) 8.6-50 MG tablet Take 1 tablet by mouth 2 (two) times daily. 07/06/21   Aline August, MD  sodium chloride (OCEAN) 0.65 % SOLN nasal spray Place 2 sprays into both nostrils 4 (four) times daily as needed for congestion.    [provider]  tamsulosin (FLOMAX) 0.4 MG CAPS capsule Take 0.4 mg by mouth daily.    [provider]  traMADol (ULTRAM) 50 MG tablet Take 2 tablets (100 mg total) by mouth at bedtime. 07/06/21   Aline August, MD  traZODone (DESYREL) 50 MG tablet Take 75 mg by mouth at bedtime.    [provider]  vitamin C (ASCORBIC ACID) 500 MG tablet Take 500 mg by mouth 2 (two) times daily.    [provider]     Kara Mead MD. FCCP. [facility] Pager : [phone]  If no response to pager , please call [phone] until 7 pm After 7:00 pm call Elink  [phone]   08/03/2021

## 2021-08-04 ENCOUNTER — Other Ambulatory Visit: Payer: Self-pay

## 2021-08-04 ENCOUNTER — Encounter (HOSPITAL_COMMUNITY): Payer: Self-pay | Admitting: Internal Medicine

## 2021-08-04 ENCOUNTER — Inpatient Hospital Stay (HOSPITAL_COMMUNITY): Payer: No Typology Code available for payment source

## 2021-08-04 ENCOUNTER — Encounter: Payer: Self-pay | Admitting: *Deleted

## 2021-08-04 DIAGNOSIS — E1159 Type 2 diabetes mellitus with other circulatory complications: Secondary | ICD-10-CM

## 2021-08-04 DIAGNOSIS — I152 Hypertension secondary to endocrine disorders: Secondary | ICD-10-CM

## 2021-08-04 DIAGNOSIS — J90 Pleural effusion, not elsewhere classified: Secondary | ICD-10-CM

## 2021-08-04 DIAGNOSIS — G4733 Obstructive sleep apnea (adult) (pediatric): Secondary | ICD-10-CM

## 2021-08-04 DIAGNOSIS — J910 Malignant pleural effusion: Secondary | ICD-10-CM | POA: Diagnosis not present

## 2021-08-04 DIAGNOSIS — R0602 Shortness of breath: Secondary | ICD-10-CM | POA: Diagnosis not present

## 2021-08-04 HISTORY — PX: IR PERC PLEURAL DRAIN W/INDWELL CATH W/IMG GUIDE: IMG5383

## 2021-08-04 LAB — BASIC METABOLIC PANEL
Anion gap: 10 (ref 5–15)
BUN: 11 mg/dL (ref 8–23)
CO2: 24 mmol/L (ref 22–32)
Calcium: 9.2 mg/dL (ref 8.9–10.3)
Chloride: 102 mmol/L (ref 98–111)
Creatinine, Ser: 0.86 mg/dL (ref 0.61–1.24)
GFR, Estimated: >60 mL/min (ref >60)
Glucose, Bld: 134 mg/dL — ABNORMAL HIGH (ref 70–99)
Potassium: 4.3 mmol/L (ref 3.5–5.1)
Sodium: 136 mmol/L (ref 135–145)

## 2021-08-04 LAB — CBC
HCT: 37.7 % — ABNORMAL LOW (ref 39.0–52.0)
Hemoglobin: 11.9 g/dL — ABNORMAL LOW (ref 13.0–17.0)
MCH: 29.6 pg (ref 26.0–34.0)
MCHC: 31.6 g/dL (ref 30.0–36.0)
MCV: 93.8 fL (ref 80.0–100.0)
Platelets: 366 10*3/uL (ref 150–400)
RBC: 4.02 MIL/uL — ABNORMAL LOW (ref 4.22–5.81)
RDW: 14.4 % (ref 11.5–15.5)
WBC: 8.7 10*3/uL (ref 4.0–10.5)
nRBC: 0 % (ref 0.0–0.2)

## 2021-08-04 LAB — HEMOGLOBIN A1C
Hgb A1c MFr Bld: 5.3 % (ref 4.8–5.6)
Mean Plasma Glucose: 105.41 mg/dL

## 2021-08-04 LAB — PROTIME-INR
INR: 1 (ref 0.8–1.2)
Prothrombin Time: 13.1 seconds (ref 11.4–15.2)

## 2021-08-04 MED ORDER — MIDAZOLAM HCL 2 MG/2ML IJ SOLN
INTRAMUSCULAR | Status: AC | PRN
  Administered 2021-08-04: 1 mg via INTRAVENOUS

## 2021-08-04 MED ORDER — FENTANYL CITRATE (PF) 100 MCG/2ML IJ SOLN
INTRAMUSCULAR | Status: AC | PRN
  Administered 2021-08-04: 50 ug via INTRAVENOUS

## 2021-08-04 MED ORDER — FENTANYL CITRATE (PF) 100 MCG/2ML IJ SOLN
INTRAMUSCULAR | Status: AC
  Filled 2021-08-04: qty 4

## 2021-08-04 MED ORDER — MIDAZOLAM HCL 2 MG/2ML IJ SOLN
INTRAMUSCULAR | Status: AC
  Filled 2021-08-04: qty 2

## 2021-08-04 MED ORDER — LIDOCAINE-EPINEPHRINE 1 %-1:100000 IJ SOLN
INTRAMUSCULAR | Status: AC | PRN
  Administered 2021-08-04: 10 mL via INTRADERMAL

## 2021-08-04 MED ORDER — CEFAZOLIN IN SODIUM CHLORIDE 3-0.9 GM/100ML-% IV SOLN
3.0000 g | INTRAVENOUS | Status: AC
  Administered 2021-08-04: 3 g via INTRAVENOUS
  Filled 2021-08-04: qty 100

## 2021-08-04 MED ORDER — LIDOCAINE-EPINEPHRINE 1 %-1:100000 IJ SOLN
INTRAMUSCULAR | Status: AC
  Filled 2021-08-04: qty 1

## 2021-08-04 MED ORDER — SODIUM CHLORIDE 0.9 % IV BOLUS
250.0000 mL | Freq: Once | INTRAVENOUS | Status: AC
  Administered 2021-08-04: 250 mL via INTRAVENOUS

## 2021-08-04 NOTE — TOC Initial Note (Addendum)
Transition of Care [facility]) - Initial/Assessment Note    Patient Details  Name: Mario [name] MRN: [id number] Date of Birth: 09-Apr-1950  Transition of Care [facility]) CM/SW Contact:    Vassie Moselle, LCSW Phone Number: 08/04/2021, 12:30 PM  Clinical Narrative:                 Met with patient and confirmed he is currently receiving HHPT/OT through [facility]. Pt plans to return home at discharge and continue with these services. Pt states that he has a can and walker at home and does not believe he has other DME needs. Pt states that he has trouble showering, making his bed, and cooking due to SOB. Pt is interested in personal care services. CSW explained that hospital is unable to obtain these services for patient. Pt believes that the [place] may be able to assist him with this. Pt will need resumption of care orders placed at discharge for [facility]. CSW will continue to follow for further discharge recommendation.   Expected Discharge Plan: [place] Barriers to Discharge: Continued Medical Work up   Patient Goals and CMS Choice Patient states their goals for this hospitalization and ongoing recovery are:: Return home      Expected Discharge Plan and Services Expected Discharge Plan: [place] In-house Referral: Clinical Social Work Discharge Planning Services: CM Consult Post Acute Care Choice: [place] arrangements for the past 2 months: Apartment                 DME Arranged: N/A         HH Arranged: PT, OT HH Agency: [place] Date [place]: 08/04/21 Time HH Agency Contacted: 46 Representative spoke with at [place]: Bradd Canary  Prior Living Arrangements/Services Living arrangements for the past 2 months: [place] with:: Roommate Patient language and need for interpreter reviewed:: Yes Do you feel safe going back to the place where you live?: Yes      Need for Family Participation in  Patient Care: No (Comment) Care giver support system in place?: Yes (comment) Current home services: DME, Home OT, Home PT Criminal Activity/Legal Involvement Pertinent to Current Situation/Hospitalization: No - Comment as needed  Activities of Daily Living Home Assistive Devices/Equipment: CPAP, Cane (specify quad or straight), Oxygen, Walker (specify type) ADL Screening (condition at time of admission) Patient's cognitive ability adequate to safely complete daily activities?: Yes Is the patient deaf or have difficulty hearing?: No Does the patient have difficulty seeing, even when wearing glasses/contacts?: No Does the patient have difficulty concentrating, remembering, or making decisions?: No (some memory with words at times) Patient able to express need for assistance with ADLs?: Yes Does the patient have difficulty dressing or bathing?: Yes Independently performs ADLs?: No Communication: Independent Dressing (OT): Needs assistance Is this a change from baseline?: Change from baseline, expected to last >3 days Grooming: Independent Feeding: Independent Bathing: Needs assistance Is this a change from baseline?: Change from baseline, expected to last >3 days Toileting: Needs assistance Is this a change from baseline?: Change from baseline, expected to last >3days In/Out Bed: Needs assistance Is this a change from baseline?: Change from baseline, expected to last >3 days Walks in Home: Needs assistance Is this a change from baseline?: Change from baseline, expected to last >3 days Does the patient have difficulty walking or climbing stairs?: Yes Weakness of Legs: Both Weakness of Arms/Hands: Both  Permission Sought/Granted Permission sought  to share information with : Facility Art therapist granted to share information with : Yes, Verbal Permission Granted     Permission granted to share info w AGENCY: [facility]        Emotional Assessment Appearance::  Appears stated age Attitude/Demeanor/Rapport: Engaged Affect (typically observed): Pleasant Orientation: : Oriented to Self, Oriented to Place, Oriented to  Time, Oriented to Situation Alcohol / Substance Use: Not Applicable Psych Involvement: No (comment)  Admission diagnosis:  Dyspnea [R06.00] SOB (shortness of breath) [R06.02] Malignant pleural effusion [J91.0] Exercise intolerance [R68.89] Patient Active Problem List   Diagnosis Date Noted   Rib pain 08/03/2021   Encounter for antineoplastic chemotherapy 07/20/2021   Leg paresthesia 06/17/2021   Sepsis ([place]) 06/17/2021   Obesity, Class III, BMI 40-49.9 (morbid obesity) ([place]) 06/17/2021   Pneumonia 06/17/2021   Acute respiratory failure with hypoxia ([place]) 06/04/2021   Acute on chronic respiratory failure with hypoxia ([place]) 06/04/2021   Hypertension    Allergic rhinitis    History of pulmonary embolism    Acute hypoxemic respiratory failure ([place]) 05/21/2021   Hyponatremia 05/20/2021   Acute pulmonary embolism without acute cor pulmonale (HCC) 02/03/2021   Mood disorder ([place]) 02/03/2021   Acute on chronic respiratory failure ([place]) 01/09/2021   Non-small cell cancer of left lung ([place]) 09/21/2020   Lung nodule 07/30/2020   Mass of left lung    Acute respiratory failure with hypoxemia ([place]) 07/26/2020   Constipation    HCAP (healthcare-associated pneumonia) 04/03/2020   COPD with acute exacerbation ([place]) 01/05/2020   Vertigo 12/26/2019   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   Stage 3a chronic kidney disease ([place]) 12/26/2019   Abnormal CT of the chest 12/26/2019   COPD (chronic obstructive pulmonary disease) ([place])    AKI (acute kidney injury) (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   Cellulitis of both feet 07/16/2017    DM2 (diabetes mellitus, type 2) ([place]) 07/16/2017   HLA B27 (HLA B27 positive) 07/16/2017   Acute chest pain    Hyperkalemia    Spontaneous pneumothorax 06/27/2016   Hypoxia    Pneumothorax on left    Dyspnea 05/23/2014   COPD exacerbation ([place]) 05/23/2014   Malingering 01/21/2013   Depression 01/11/2013   Tobacco abuse 01/11/2013   Obesity, unspecified 01/11/2013   Chest pain 01/10/2013   Hypertension associated with diabetes ([place]) 01/10/2013   PCP:  Clinic, [place]:   [place], [place] - [address] [place] [address] Phone: [phone] Fax: [phone]     Social Determinants of Health (SDOH) Interventions    Readmission Risk Interventions    06/07/2021    4:15 PM  Readmission Risk Prevention Plan  Transportation Screening Complete  Medication Review (RN Care Manager) Complete  PCP or Specialist appointment within 3-5 days of discharge Complete  HRI or [place] Complete  SW Recovery Care/Counseling Consult Complete  [place] Not Applicable

## 2021-08-04 NOTE — Progress Notes (Signed)
   08/03/21 2022  Assess: MEWS Score  Temp (!) 97.5 F (36.4 C)  BP (!) 145/88  MAP (mmHg) 106  Pulse Rate (!) 111  Resp 18  SpO2 94 %  O2 Device Nasal Cannula  O2 Flow Rate (L/min) 3 L/min  Assess: MEWS Score  MEWS Temp 0  MEWS Systolic 0  MEWS Pulse 2  MEWS RR 0  MEWS LOC 0  MEWS Score 2  MEWS Score Color Yellow  Assess: if the MEWS score is Yellow or Red  Were vital signs taken at a resting state? Yes  Focused Assessment No change from prior assessment  Does the patient meet 2 or more of the SIRS criteria? No  MEWS guidelines implemented *See Row Information* Yes  Treat  MEWS Interventions Administered scheduled meds/treatments  Pain Scale 0-10  Pain Score 7  Pain Type Acute pain  Pain Location Rib cage  Pain Orientation Left  Pain Descriptors / Indicators Sharp  Pain Frequency Constant  Pain Onset Progressive  Patients Stated Pain Goal 4  Pain Intervention(s) Medication (See eMAR) (med admin at 2020)  Take Vital Signs  Increase Vital Sign Frequency  Yellow: Q 2hr X 2 then Q 4hr X 2, if remains yellow, continue Q 4hrs  Escalate  MEWS: Escalate Yellow: discuss with charge nurse/RN and consider discussing with provider and RRT  Notify: Charge Nurse/RN  Name of Charge Nurse/RN Notified Kerry Dory, RN  Date Charge Nurse/RN Notified 08/03/21  Time Charge Nurse/RN Notified 2025  Notify: Provider  Provider Name/Title Olena Heckle, NP  Date Provider Notified 08/03/21  Time Provider Notified 2025  Method of Notification Page  Notification Reason Other (Comment) (mews yellow d/t increase HR)  Provider response No new orders  Date of Provider Response 08/03/21  Time of Provider Response 2025  Document  Patient Outcome Stabilized after interventions  Progress note created (see row info) Yes  Assess: SIRS CRITERIA  SIRS Temperature  0  SIRS Pulse 1  SIRS Respirations  0  SIRS WBC 0  SIRS Score Sum  1   Pt's mews yellow d/t increased HR. MD and charge RN notified. Yellow  mews protocol initiated. Plan of care ongoing.

## 2021-08-04 NOTE — Progress Notes (Signed)
PROGRESS NOTE    Mario [name]  UPJ:[id number] DOB: 12/11/1950 DOA: 08/03/2021 PCP: [facility]     Brief Narrative:   71 y.o. WM PMHx Morbid obesity, HTN, DM Type II, CKD, COPD, OSA and NSCLC (follows with Dr. Mckinley Jewel).  Chronic respiratory failure with hypoxia, 3L O2 at home.  Hospitalized in May with enlarging pleural effusion.  He underwent thoracentesis and cytology showed metastatic carcinoma.  He was d/c'd to SNF and recently released from SNF to live with a "roommate".  Over the last few days he has noticed worsening dyspnea especially with exertion.  He also is c/o left lateral rib pain.     In the ER, patient had xray and CTA of his chest.  CT scan shows re-occurrence of the left pleural effusion.  ER MD consulted PCCM.  Patient unclear as of now if he would want repeat thoracentesis vs pleurx catheter.     Currently his only complaint is rib pain and is asking for oxycodone.  Not pleased with the 82m given by the ER MD.     Subjective: A/O x4, patient upset that he cannot eat prior to procedure.   Assessment & Plan: Covid vaccination;   Principal Problem:   Dyspnea Active Problems:   Hypertension associated with diabetes ([place]   Obesity, unspecified   Pneumothorax on left   DM2 (diabetes mellitus, type 2) (HCC)   OSA (obstructive sleep apnea)   Rib pain   Recurrent LEFT pleural effusion - likely malignant -added [name] to treatment team-  saw 5/23: I had a lengthy discussion with the patient today about his condition and treatment options.  I offered him the option of palliative care and hospice referral versus consideration of systemic treatment based on the molecular studies and PD-L1 expression. The patient is still interested in some form of treatment.  I will wait for the molecular studies before making final decision regarding his treatment. -hold Eliquis until decision made -6/ 7 LEFT Pleurx cath to be placed by IR   Chronic  respiratory  failure -continue 3L O2  OSA - CPAP per respiratory   Nondisplaced left anterior seventh rib fracture. -resume pain meds   Obesity BMI 41.73 kg/m Estimated body mass index is 41.73 kg/m as calculated from the following:   Height as of this encounter: _0  (1.88 m).   Weight as of this encounter: 147.4 kg.   Goals of care - 6/7 palliative consult: Evaluate for changing CODE STATUS to DNR at a minimum.  Patient will require HCPOA sooner rather than later    Mobility Assessment (last 72 hours)     Mobility Assessment     Row Name 08/04/21 0800           Does patient have an order for bedrest or is patient medically unstable No - Continue assessment       What is the highest level of mobility based on the progressive mobility assessment? Level 5 (Walks with assist in room/hall) - Balance while stepping forward/back and can walk in room with assist - Complete                  Interdisciplinary Goals of Care Family Meeting   Date carried out: 08/04/2021  Location of the meeting:   Member's involved:   Durable Power of ATour manager     Discussion: We discussed goals of care for [employer].    Code status:   Disposition:  Time spent for the meeting:     Allie Bossier, MD  08/04/2021, 1:13 PM         DVT prophylaxis:  Code Status: Full Family Communication:  Status is: Inpatient    Dispo: The patient is from: Home              Anticipated d/c is to: Home              Anticipated d/c date is: 1 day              Patient currently is not medically stable to d/c.      Consultants:  IR Oncology?  Procedures/Significant Events:    I have personally reviewed and interpreted all radiology studies and my findings are as above.  VENTILATOR SETTINGS:    Cultures   Antimicrobials: Anti-infectives (From admission, onward)    Start     Dose/Rate Route Frequency Ordered Stop   08/04/21 1615   ceFAZolin (ANCEF) IVPB 3g/100 mL premix        3 g 200 mL/hr over 30 Minutes Intravenous To Radiology 08/04/21 1525 08/04/21 1645         Devices    LINES / TUBES:      Continuous Infusions:   Objective: Vitals:   08/03/21 2232 08/04/21 0152 08/04/21 0503 08/04/21 0840  BP: (!) 149/82 135/77 112/67 139/82  Pulse: (!) 113 (!) 111 (!) 107 (!) 103  Resp: _0 Temp: 97.7 F (36.5 C) 97.7 F (36.5 C) 98.6 F (37 C) (!) 97.5 F (36.4 C)  TempSrc: Oral Oral Oral Oral  SpO2: 95% 94% 97% 93%  Weight:      Height:       No intake or output data in the 24 hours ending 08/04/21 1313 Filed Weights   08/03/21 1116  Weight: (!) 147.4 kg    Examination:  General: A/O x4, No acute respiratory distress Eyes: negative scleral hemorrhage, negative anisocoria, negative icterus ENT: Negative Runny nose, negative gingival bleeding, Neck:  Negative scars, masses, torticollis, lymphadenopathy, JVD Lungs: absent breath sounds left lung field, positive clear to auscultation right lung field  without wheezes or crackles Cardiovascular: Regular rate and rhythm without murmur gallop or rub normal S1 and S2 Abdomen: Morbidly obese, abdominal pain, positive distention, positive soft, bowel sounds, no rebound, no ascites, no appreciable mass Extremities: No significant cyanosis, clubbing, or edema bilateral lower extremities Skin: Negative rashes, lesions, ulcers Psychiatric:  Negative depression, negative anxiety, negative fatigue, negative mania  Central nervous system:  Cranial nerves II through XII intact, tongue/uvula midline, all extremities muscle strength 5/5, sensation intact throughout, negative dysarthria, negative expressive aphasia, negative receptive aphasia.  .     Data Reviewed: Care during the described time interval was provided by me .  I have reviewed this patient's available data, including medical history, events of note, physical examination, and all test  results as part of my evaluation.  CBC: Recent Labs  Lab 08/03/21 1228 08/04/21 0512  WBC 11.1* 8.7  HGB 12.6* 11.9*  HCT 39.6 37.7*  MCV 93.8 93.8  PLT 425* 528   Basic Metabolic Panel: Recent Labs  Lab 08/03/21 1228 08/03/21 1422 08/04/21 0512  NA 133*  --  136  K 4.7  --  4.3  CL 101  --  102  CO2 22  --  24  GLUCOSE 142*  --  134*  BUN 9  --  11  CREATININE 0.88  --  0.86  CALCIUM 8.9  --  9.2  MG  --  2.3  --    GFR: Estimated Creatinine Clearance: 124.2 mL/min (by C-G formula based on SCr of 0.86 mg/dL). Liver Function Tests: Recent Labs  Lab 08/03/21 1422  AST 33  ALT 18  ALKPHOS 118  BILITOT 1.0  PROT 7.4  ALBUMIN 3.9   No results for input(s): LIPASE, AMYLASE in the last 168 hours. No results for input(s): AMMONIA in the last 168 hours. Coagulation Profile: Recent Labs  Lab 08/04/21 0512  INR 1.0   Cardiac Enzymes: No results for input(s): CKTOTAL, CKMB, CKMBINDEX, TROPONINI in the last 168 hours. BNP (last 3 results) No results for input(s): PROBNP in the last 8760 hours. HbA1C: Recent Labs    08/04/21 0512  HGBA1C 5.3   CBG: No results for input(s): GLUCAP in the last 168 hours. Lipid Profile: No results for input(s): CHOL, HDL, LDLCALC, TRIG, CHOLHDL, LDLDIRECT in the last 72 hours. Thyroid Function Tests: No results for input(s): TSH, T4TOTAL, FREET4, T3FREE, THYROIDAB in the last 72 hours. Anemia Panel: No results for input(s): VITAMINB12, FOLATE, FERRITIN, TIBC, IRON, RETICCTPCT in the last 72 hours. Sepsis Labs: No results for input(s): PROCALCITON, LATICACIDVEN in the last 168 hours.  Recent Results (from the past 240 hour(s))  Resp Panel by RT-PCR (Flu A&B, Covid) Anterior Nasal Swab     Status: None   Collection Time: 08/03/21  1:33 PM   Specimen: Anterior Nasal Swab  Result Value Ref Range Status   SARS Coronavirus 2 by RT PCR NEGATIVE NEGATIVE Final    Comment: (NOTE) SARS-CoV-2 target nucleic acids are NOT  DETECTED.  The SARS-CoV-2 RNA is generally detectable in upper respiratory specimens during the acute phase of infection. The lowest concentration of SARS-CoV-2 viral copies this assay can detect is 138 copies/mL. A negative result does not preclude SARS-Cov-2 infection and should not be used as the sole basis for treatment or other patient management decisions. A negative result may occur with  improper specimen collection/handling, submission of specimen other than nasopharyngeal swab, presence of viral mutation(s) within the areas targeted by this assay, and inadequate number of viral copies(<138 copies/mL). A negative result must be combined with clinical observations, patient history, and epidemiological information. The expected result is Negative.  Fact Sheet for Patients:  EntrepreneurPulse.com.au  Fact Sheet for Healthcare Providers:  IncredibleEmployment.be  This test is no t yet approved or cleared by the [place] FDA and  has been authorized for detection and/or diagnosis of SARS-CoV-2 by FDA under an Emergency Use Authorization (EUA). This EUA will remain  in effect (meaning this test can be used) for the duration of the COVID-19 declaration under Section 564(b)(1) of the Act, 21 U.S.C.section 360bbb-3(b)(1), unless the authorization is terminated  or revoked sooner.       Influenza A by PCR NEGATIVE NEGATIVE Final   Influenza B by PCR NEGATIVE NEGATIVE Final    Comment: (NOTE) The Xpert Xpress SARS-CoV-2/FLU/RSV plus assay is intended as an aid in the diagnosis of influenza from Nasopharyngeal swab specimens and should not be used as a sole basis for treatment. Nasal washings and aspirates are unacceptable for Xpert Xpress SARS-CoV-2/FLU/RSV testing.  Fact Sheet for Patients: EntrepreneurPulse.com.au  Fact Sheet for Healthcare Providers: IncredibleEmployment.be  This test is not yet  approved or cleared by the [place] FDA and has been authorized for detection and/or diagnosis of SARS-CoV-2 by FDA under an Emergency Use Authorization (EUA). This EUA will remain in effect (meaning this  test can be used) for the duration of the COVID-19 declaration under Section 564(b)(1) of the Act, 21 U.S.C. section 360bbb-3(b)(1), unless the authorization is terminated or revoked.  Performed at [facility], [place] [address], [place], [place] [address]          Radiology Studies: DG Chest 2 View  Result Date: 08/03/2021 CLINICAL DATA:  Shortness of breath. EXAM: CHEST - 2 VIEW COMPARISON:  07/04/2021 FINDINGS: Asymmetric elevation left hemidiaphragm with left base collapse/consolidation and probable effusion. Bleb at the left base is stable in the interval. The cardio pericardial silhouette is enlarged. Telemetry leads overlie the chest. IMPRESSION: Asymmetric elevation left hemidiaphragm with left base collapse/consolidation and probable effusion. Electronically Signed   By: Misty Stanley M.D.   On: 08/03/2021 11:49   CT Angio Chest PE W and/or Wo Contrast  Result Date: 08/03/2021 CLINICAL DATA:  Pulmonary embolism (PE) suspected, high prob Worsening shortness of breath over the last 4 days. Left-sided chest pain. Patient with history of lung cancer. EXAM: CT ANGIOGRAPHY CHEST WITH CONTRAST TECHNIQUE: Multidetector CT imaging of the chest was performed using the standard protocol during bolus administration of intravenous contrast. Multiplanar CT image reconstructions and MIPs were obtained to evaluate the vascular anatomy. RADIATION DOSE REDUCTION: This exam was performed according to the departmental dose-optimization program which includes automated exposure control, adjustment of the mA and/or kV according to patient size and/or use of iterative reconstruction technique. CONTRAST:  83mL OMNIPAQUE IOHEXOL 350 MG/ML SOLN COMPARISON:  Radiograph earlier today.   Chest CT 06/23/2021 FINDINGS: Cardiovascular: There are no filling defects within the pulmonary arteries to suggest pulmonary embolus. Portions of the left lower lobe subsegmental branches are attenuated and not well assessed. Additionally there is motion artifact obscuring the bases. Atherosclerosis of the thoracic aorta. No dissection or acute aortic findings. Coronary artery calcifications. The heart is normal in size. No pericardial effusion. Mediastinum/Nodes: Loculated pleural fluid tracks along the left aspect of the mediastinum. No enlarged mediastinal or hilar lymph nodes. No axillary or supraclavicular adenopathy. Lungs/Pleura: Partially loculated left pleural effusion has progressed in size from CT 6 weeks ago, now moderate to large. Loculated component tracks anteriorly at the lung base and medially adjacent to the mediastinum. A bulla at the left lung base measures 6.4 cm, decreased in size from prior exam when it measured 8 cm, and is elevated related to adjacent pleural effusion. There is associated airspace disease and/or atelectasis in the left lower and left upper lobe. Loculated fluid tracks into the inter lobar fissure on the left. Areas of pleural enhancement in the basilar hemithorax, series 5, image 123 and 128. Underlying emphysema. Bulla at the right lung base are unchanged. No acute right lung airspace disease. No right pleural effusion. Suspected basilar linear atelectasis, although obscured by motion. Upper Abdomen: Limited assessment of the upper abdomen demonstrates no acute findings. Musculoskeletal: There is a nondisplaced fracture of the left anterior seventh rib. No evidence of underlying lesion. Thoracic spondylosis. Review of the MIP images confirms the above findings. IMPRESSION: 1. No pulmonary embolus. 2. Moderate to large partially loculated left pleural effusion has progressed in size from CT 6 weeks ago. Loculated component tracks anteriorly at the lung base and medially  adjacent to the mediastinum. There is associated airspace disease and/or atelectasis in the left lower and left upper lobe. A bulla at the left lung base has decreased in size from prior exam, and is elevated related to adjacent pleural fluid. Areas of pleural enhancement in the  basilar hemithorax raises concern for underlying pleural based metastatic disease. 3. Nondisplaced left anterior seventh rib fracture. 4. Emphysema.  Stable size of right basilar bulla. 5. Aortic atherosclerosis.  Coronary artery calcifications. Aortic Atherosclerosis (ICD10-I70.0) and Emphysema (ICD10-J43.9). Electronically Signed   By: Keith Rake M.D.   On: 08/03/2021 15:24        Scheduled Meds:  arformoterol  15 mcg Nebulization BID   vitamin C  500 mg Oral BID   aspirin EC  81 mg Oral Daily   budesonide  0.25 mg Nebulization BID   buPROPion  300 mg Oral Daily   busPIRone  15 mg Oral Daily   cycloSPORINE  1 drop Both Eyes BID   diltiazem  180 mg Oral Daily   DULoxetine  60 mg Oral BID   enoxaparin (LOVENOX) injection  75 mg Subcutaneous Q24H   gabapentin  300 mg Oral BID   lurasidone  40 mg Oral QPC supper   mycophenolate  1,500 mg Oral BID   OXcarbazepine  900 mg Oral QHS   And   OXcarbazepine  600 mg Oral BID WC   oxyCODONE  20 mg Oral Q12H   senna-docusate  1 tablet Oral BID   tamsulosin  0.4 mg Oral Daily   traZODone  75 mg Oral QHS   Continuous Infusions:   LOS: 1 day    Time spent:40 min    Harding Thomure, Geraldo Docker, MD [facility]   If 7PM-7AM, please contact night-coverage 08/04/2021, 1:13 PM

## 2021-08-04 NOTE — Progress Notes (Signed)
Oncology Nurse Navigator Documentation     08/04/2021    9:00 AM 06/28/2021    8:00 AM 09/22/2020    3:00 PM  Oncology Nurse Navigator Flowsheets  Abnormal Finding Date   07/26/2020  Confirmed Diagnosis Date   07/30/2020  Diagnosis Status   Pending Molecular Studies  Planned Course of Treatment   Radiation  Phase of Treatment   Radiation  Navigator Follow Up Date:   09/24/2020  Navigator Follow Up Reason:   Appointment Review  Navigator Location [facility]  Navigator Encounter Type Inpatient Inpatient Clinic/MDC;Initial MedOnc  Patient Visit Type   Initial;MedOnc  Treatment Phase  Other Pre-Tx/Tx Discussion  Barriers/Navigation Needs Coordination of Care/I received a message from [place] patient needs [employer] paper work completed. I follow up with Dr. Julien Nordmann. He would like cytology from 06/23/21 to be sent for molecular. I followed up on the [employer] portal. Cytology was sent on pleural fluid already and is pending. I updated Dr. Julien Nordmann and RN.   Coordination of Care Coordination of Care;Education  Education   Concerns with Finances/ Eligibility;Newly Diagnosed Cancer Education;Understanding Cancer/ Treatment Options;Other  Interventions Coordination of Care Coordination of Care Education;Coordination of Care;Psycho-Social Support  Acuity Level 2-Minimal Needs (1-2 Barriers Identified) Level 3-Moderate Needs (3-4 Barriers Identified) Level 3-Moderate Needs (3-4 Barriers Identified)  Coordination of Care Pathology Pathology;Appts Other;Appts  Education Method   Verbal;Written  Time Spent with Patient [phone]

## 2021-08-04 NOTE — Progress Notes (Signed)
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 CT revealed left malignant pleural effusion secondary to left lower lobe adenocarcinoma. Placement of a Pleurx by IR was requested by Dr Elsworth Soho.   ROS Constitutional: negative for fever and sweats, positive for HAs which pt states is secondary to chronic neck pain Respiratory: Negative for cough, positive for SOB and left sided pain on deep inspiration Cardiovascular: negative for LE edema & chest pain unrelated to breathing Gastrointestinal: Negative for vomiting, diarrhea; positive for nausea which pt states is related to hunger   Allergies: Demerol [meperidine] and Zocor [simvastatin]  Medications: Prior to Admission medications   Medication Sig Start Date End Date Taking? Authorizing Provider  albuterol (PROVENTIL) (2.5 MG/3ML) 0.083% nebulizer solution Inhale 3 mLs (2.5 mg total) by nebulization every 4 (four) hours as needed for wheezing or shortness of breath. Patient taking differently: Take 2.5 mg by nebulization every 6 (six) hours as needed for wheezing or shortness of breath. 08/04/20 08/04/21 Yes Lavina Hamman, MD  albuterol (VENTOLIN HFA) 108 (90 Base) MCG/ACT inhaler Inhale 2 puffs into the lungs every 6 (six) hours as needed for wheezing. 06/02/20  Yes [provider]  apixaban (ELIQUIS) 5 MG TABS tablet Take 5 mg by mouth 2 (two) times daily.   Yes [provider]  aspirin 81 MG EC tablet Take 81 mg by mouth daily.   Yes [provider]  b complex vitamins capsule Take 1 capsule by mouth daily.   Yes [provider]  buPROPion (WELLBUTRIN XL) 300 MG  24 hr tablet Take 300 mg by mouth daily. 02/05/20  Yes [provider]  busPIRone (BUSPAR) 15 MG tablet Take 15 mg by mouth daily.   Yes [provider]  calcium-vitamin D (OSCAL WITH D) 500-200 MG-UNIT tablet Take 1 tablet by mouth 2 (two) times daily with a meal.   Yes [provider]  carboxymethylcellulose (REFRESH PLUS) 0.5 % SOLN Place 1 drop into both eyes 4 (four) times daily as needed (dry eyes).   Yes [provider]  Cholecalciferol (VITAMIN D-3) 25 MCG (1000 UT) CAPS Take 1,000 Units by mouth 2 (two) times daily.   Yes [provider]  clobetasol ointment (TEMOVATE) 2.35 % Apply 1 application. topically 2 (two) times daily as needed (for scars).   Yes [provider]  clotrimazole-betamethasone (LOTRISONE) cream Apply 1 application. topically 2 (two) times daily as needed (fungus). 07/06/21  Yes Aline August, MD  cycloSPORINE (RESTASIS) 0.05 % ophthalmic emulsion Place 1 drop into both eyes 2 (two) times daily.   Yes [provider]  diltiazem (CARDIZEM CD) 180 MG 24 hr capsule Take 180 mg by mouth daily.   Yes [provider]  DULoxetine (CYMBALTA) 60 MG capsule Take 1 capsule (60 mg total) by mouth 2 (two) times daily. 07/21/17  Yes Lavina Hamman, MD  feeding supplement (ENSURE ENLIVE / ENSURE PLUS) LIQD Take 237 mLs by mouth 3 (three) times daily between meals. Patient taking differently: Take 1 Bottle by mouth daily. 08/04/20  Yes Lavina Hamman, MD  folic acid (FOLVITE) 1 MG tablet Take 1 mg by mouth daily.   Yes [provider]  gabapentin (NEURONTIN) 300 MG capsule Take 1 capsule (300 mg total) by mouth 2 (two) times daily. 07/06/21  Yes Aline August, MD  GARLIC OIL PO Take 1 capsule by mouth daily.   Yes [provider]  guaiFENesin (MUCINEX) 600 MG 12 hr tablet Take 1 tablet (600 mg total) by mouth 2 (two) times daily as needed for cough. 07/06/21  Yes Aline August, MD  lisinopril (ZESTRIL)  20 MG tablet Take 20 mg by mouth daily.   Yes [provider]  lurasidone (LATUDA) 40 MG TABS tablet Take 40 mg by mouth daily after supper.   Yes [provider]  Melatonin 3 MG TABS Take 6 mg by mouth at bedtime.   Yes [provider]  metFORMIN (GLUCOPHAGE) 500 MG tablet Take 500 mg by mouth 2 (two) times daily with a meal.   Yes [provider]  methocarbamol (ROBAXIN) 500 MG tablet Take 1 tablet (500 mg total) by mouth every 8 (eight) hours as needed for muscle spasms. 07/06/21  Yes Aline August, MD  Mometasone Furoate 200 MCG/ACT AERO Inhale 2 puffs into the lungs at bedtime.   Yes [provider]  mycophenolate (CELLCEPT) 250 MG capsule Take 1,500 mg by mouth 2 (two) times daily.   Yes [provider]  nitroGLYCERIN (NITROSTAT) 0.4 MG SL tablet Place 1 tablet (0.4 mg total) under the tongue every 5 (five) minutes as needed for chest pain. 07/03/19  Yes Danford, Suann Larry, MD  Olodaterol HCl (STRIVERDI RESPIMAT) 2.5 MCG/ACT AERS Inhale 2 puffs into the lungs daily.   Yes [provider]  OVER THE COUNTER MEDICATION Take 1 tablet by mouth at bedtime. Medication: Pure ZZZs sleep and destress   Yes [provider]  oxyCODONE (OXYCONTIN) 20 mg 12 hr tablet Take 20 mg by mouth every 12 (twelve) hours.   Yes [provider]  OXYGEN Inhale 3 L into the lungs at bedtime.   Yes [provider]  Polyethyl Glycol-Propyl Glycol (SYSTANE OP) Place 1 drop into both eyes daily as needed (dry eye).   Yes [provider]  polyethylene glycol (MIRALAX / GLYCOLAX) 17 g packet Take 17 g by mouth daily.   Yes [provider]  PRESCRIPTION MEDICATION Inhale into the lungs See admin instructions. CPAP- At bedtime and during during any naps   Yes [provider]  senna-docusate (SENOKOT-S) 8.6-50 MG tablet Take 1 tablet by mouth 2 (two) times daily. 07/06/21  Yes Aline August, MD  sodium chloride  (OCEAN) 0.65 % SOLN nasal spray Place 2 sprays into both nostrils 4 (four) times daily as needed for congestion.   Yes [provider]  tamsulosin (FLOMAX) 0.4 MG CAPS capsule Take 0.4 mg by mouth daily.   Yes [provider]  traMADol (ULTRAM) 50 MG tablet Take 2 tablets (100 mg total) by mouth at bedtime. 07/06/21  Yes Aline August, MD  traZODone (DESYREL) 50 MG tablet Take 75 mg by mouth at bedtime.   Yes [provider]  fluticasone (FLONASE) 50 MCG/ACT nasal spray Place 2 sprays into both nostrils daily.     [provider]  methotrexate (RHEUMATREX) 2.5 MG tablet Take 15 mg by mouth once a week.    [provider]  oxcarbazepine (TRILEPTAL) 600 MG tablet Take 600-900 mg by mouth See admin instructions. Take 600mg  by mouth during the day, then take  900 mg by mouth at bedtime 07/16/20   [provider]  oxyCODONE-acetaminophen (PERCOCET/ROXICET) 5-325 MG tablet Take  1 tablet by mouth every 6 (six) hours as needed for severe pain. Patient not taking: Reported on 07/20/2021 07/06/21   Aline August, MD  OXYCONTIN 10 MG 12 hr tablet Take 10 mg by mouth daily. Patient not taking: Reported on 07/20/2021 07/16/21   [provider]  pantoprazole (PROTONIX) 40 MG tablet Take 1 tablet (40 mg total) by mouth 2 (two) times daily. Patient not taking: Reported on 08/03/2021 07/06/21   Aline August, MD  vitamin C (ASCORBIC ACID) 500 MG tablet Take 500 mg by mouth 2 (two) times daily.    [provider]     Vital Signs: BP 139/82 (BP Location: Left Arm)   Pulse (!) 103   Temp (!) 97.5 F (36.4 C) (Oral)   Resp 18   Ht 6\' 2"  (1.88 m)   Wt (!) 325 lb (147.4 kg)   SpO2 93%   BMI 41.73 kg/m   Physical Exam Vitals reviewed.  Constitutional:      General: He is not in acute distress.    Appearance: He is obese.  Cardiovascular:     Rate and Rhythm: Regular rhythm.     Pulses: Normal pulses.     Heart sounds: Normal heart sounds.      Comments: Slight tachycardia Pulmonary:     Effort: No respiratory distress.     Breath sounds: Examination of the left-middle field reveals decreased breath sounds. Examination of the left-lower field reveals decreased breath sounds. Decreased breath sounds present. No wheezing, rhonchi or rales.  Chest:     Comments: Left sided tenderness Abdominal:     General: Bowel sounds are normal.     Palpations: Abdomen is soft.  Musculoskeletal:     Right lower leg: No edema.     Left lower leg: No edema.  Skin:    General: Skin is warm and dry.  Neurological:     Mental Status: He is alert and oriented to person, place, and time.  Psychiatric:        Mood and Affect: Mood normal.        Behavior: Behavior normal.    Imaging: DG Chest 2 View  Result Date: 08/03/2021 CLINICAL DATA:  Shortness of breath. EXAM: CHEST - 2 VIEW COMPARISON:  07/04/2021 FINDINGS: Asymmetric elevation left hemidiaphragm with left base collapse/consolidation and probable effusion. Bleb at the left base is stable in the interval. The cardio pericardial silhouette is enlarged. Telemetry leads overlie the chest. IMPRESSION: Asymmetric elevation left hemidiaphragm with left base collapse/consolidation and probable effusion. Electronically Signed   By: Misty Stanley M.D.   On: 08/03/2021 11:49   CT Angio Chest PE W and/or Wo Contrast  Result Date: 08/03/2021 CLINICAL DATA:  Pulmonary embolism (PE) suspected, high prob Worsening shortness of breath over the last 4 days. Left-sided chest pain. Patient with history of lung cancer. EXAM: CT ANGIOGRAPHY CHEST WITH CONTRAST TECHNIQUE: Multidetector CT imaging of the chest was performed using the standard protocol during bolus administration of intravenous contrast. Multiplanar CT image reconstructions and MIPs were obtained to evaluate the vascular anatomy. RADIATION DOSE REDUCTION: This exam was performed according to the departmental dose-optimization program which includes  automated exposure control, adjustment of the mA and/or kV according to patient size and/or use of iterative reconstruction technique. CONTRAST:  61mL OMNIPAQUE IOHEXOL 350 MG/ML SOLN COMPARISON:  Radiograph earlier today.  Chest CT 06/23/2021 FINDINGS: Cardiovascular: There are no filling defects within the pulmonary arteries to suggest pulmonary embolus. Portions of the left lower lobe subsegmental  branches are attenuated and not well assessed. Additionally there is motion artifact obscuring the bases. Atherosclerosis of the thoracic aorta. No dissection or acute aortic findings. Coronary artery calcifications. The heart is normal in size. No pericardial effusion. Mediastinum/Nodes: Loculated pleural fluid tracks along the left aspect of the mediastinum. No enlarged mediastinal or hilar lymph nodes. No axillary or supraclavicular adenopathy. Lungs/Pleura: Partially loculated left pleural effusion has progressed in size from CT 6 weeks ago, now moderate to large. Loculated component tracks anteriorly at the lung base and medially adjacent to the mediastinum. A bulla at the left lung base measures 6.4 cm, decreased in size from prior exam when it measured 8 cm, and is elevated related to adjacent pleural effusion. There is associated airspace disease and/or atelectasis in the left lower and left upper lobe. Loculated fluid tracks into the inter lobar fissure on the left. Areas of pleural enhancement in the basilar hemithorax, series 5, image 123 and 128. Underlying emphysema. Bulla at the right lung base are unchanged. No acute right lung airspace disease. No right pleural effusion. Suspected basilar linear atelectasis, although obscured by motion. Upper Abdomen: Limited assessment of the upper abdomen demonstrates no acute findings. Musculoskeletal: There is a nondisplaced fracture of the left anterior seventh rib. No evidence of underlying lesion. Thoracic spondylosis. Review of the MIP images confirms the above  findings. IMPRESSION: 1. No pulmonary embolus. 2. Moderate to large partially loculated left pleural effusion has progressed in size from CT 6 weeks ago. Loculated component tracks anteriorly at the lung base and medially adjacent to the mediastinum. There is associated airspace disease and/or atelectasis in the left lower and left upper lobe. A bulla at the left lung base has decreased in size from prior exam, and is elevated related to adjacent pleural fluid. Areas of pleural enhancement in the basilar hemithorax raises concern for underlying pleural based metastatic disease. 3. Nondisplaced left anterior seventh rib fracture. 4. Emphysema.  Stable size of right basilar bulla. 5. Aortic atherosclerosis.  Coronary artery calcifications. Aortic Atherosclerosis (ICD10-I70.0) and Emphysema (ICD10-J43.9). Electronically Signed   By: Keith Rake M.D.   On: 08/03/2021 15:24    Labs:  CBC: Recent Labs    07/06/21 0456 07/20/21 0857 08/03/21 1228 08/04/21 0512  WBC 10.7* 8.1 11.1* 8.7  HGB 8.7* 11.0* 12.6* 11.9*  HCT 27.6* 33.9* 39.6 37.7*  PLT 449* 552* 425* 366    COAGS: Recent Labs    02/03/21 2205 08/04/21 0512  INR 1.0 1.0  APTT 29  --     BMP: Recent Labs    07/06/21 0456 07/20/21 0857 08/03/21 1228 08/04/21 0512  NA 134* 136 133* 136  K 4.0 4.6 4.7 4.3  CL 98 102 101 102  CO2 29 27 22 24   GLUCOSE 191* 181* 142* 134*  BUN 15 11 9 11   CALCIUM 8.4* 9.3 8.9 9.2  CREATININE 0.95 1.05 0.88 0.86  GFRNONAA >60 >60 >60 >60    LIVER FUNCTION TESTS: Recent Labs    07/03/21 0459 07/04/21 0554 07/20/21 0857 08/03/21 1422  BILITOT 0.4 0.5 0.3 1.0  AST 17 18 17  33  ALT 22 21 19 18   ALKPHOS 55 61 85 118  PROT 5.4* 5.6* 7.0 7.4  ALBUMIN 2.9* 3.3* 4.0 3.9    Assessment and Plan: Mario [name] is a 71yo man presenting with moderate-to-large left malignant effusion secondary to left lower lobe adenocarcinoma. Placement of a Pleurx by IR was requested by Dr  Elsworth Soho.  Recurrent left malignant effusion -Placement  of Pleurx drain on left side by IR tentatively planned for 6/7 -Benefits of improvement of dyspnea and avoidance of recurrent thoracentesis procedures, and risks such as bleeding, infection, and pain at procedure site were discussed with the patient. Patient's questions were answered, patient is amenable to proceed with Pleurx placement. Consent signed and in chart. Plans also confirmed with Dr. Julien Nordmann.   Electronically Signed: D. Rowe Phoenix, PA-C/ Reatha Armour, PA-C 08/04/2021, 11:57 AM   I spent a total of 25 minutes at the the patient's bedside AND on the patient's hospital floor or unit, greater than 50% of which was counseling/coordinating care for left pleurx catheter placement    Patient ID: [name], male   DOB: 1950-09-01, 71 y.o.   MRN: [id number]

## 2021-08-04 NOTE — Procedures (Signed)
Interventional Radiology Procedure Note  Procedure: [place] AND FLUORO LEFT CHEST PLEURX 1.2 L THORA ALSO    Complications: None  Estimated Blood Loss:  MIN  Findings: FULL REPORT IN PACS     Tamera Punt, MD

## 2021-08-05 ENCOUNTER — Telehealth: Payer: Self-pay | Admitting: Acute Care

## 2021-08-05 ENCOUNTER — Encounter: Payer: Self-pay | Admitting: *Deleted

## 2021-08-05 DIAGNOSIS — R0602 Shortness of breath: Secondary | ICD-10-CM | POA: Diagnosis not present

## 2021-08-05 LAB — COMPREHENSIVE METABOLIC PANEL
ALT: 13 U/L (ref 0–44)
AST: 14 U/L — ABNORMAL LOW (ref 15–41)
Albumin: 3.3 g/dL — ABNORMAL LOW (ref 3.5–5.0)
Alkaline Phosphatase: 94 U/L (ref 38–126)
Anion gap: 7 (ref 5–15)
BUN: 13 mg/dL (ref 8–23)
CO2: 28 mmol/L (ref 22–32)
Calcium: 8.8 mg/dL — ABNORMAL LOW (ref 8.9–10.3)
Chloride: 101 mmol/L (ref 98–111)
Creatinine, Ser: 0.97 mg/dL (ref 0.61–1.24)
GFR, Estimated: >60 mL/min (ref >60)
Glucose, Bld: 124 mg/dL — ABNORMAL HIGH (ref 70–99)
Potassium: 4.3 mmol/L (ref 3.5–5.1)
Sodium: 136 mmol/L (ref 135–145)
Total Bilirubin: 0.4 mg/dL (ref 0.3–1.2)
Total Protein: 6.4 g/dL — ABNORMAL LOW (ref 6.5–8.1)

## 2021-08-05 LAB — CBC WITH DIFFERENTIAL/PLATELET
Abs Immature Granulocytes: 0.03 10*3/uL (ref 0.00–0.07)
Basophils Absolute: 0.1 10*3/uL (ref 0.0–0.1)
Basophils Relative: 1 %
Eosinophils Absolute: 0.2 10*3/uL (ref 0.0–0.5)
Eosinophils Relative: 2 %
HCT: 35 % — ABNORMAL LOW (ref 39.0–52.0)
Hemoglobin: 11 g/dL — ABNORMAL LOW (ref 13.0–17.0)
Immature Granulocytes: 0 %
Lymphocytes Relative: 10 %
Lymphs Abs: 1 10*3/uL (ref 0.7–4.0)
MCH: 30.1 pg (ref 26.0–34.0)
MCHC: 31.4 g/dL (ref 30.0–36.0)
MCV: 95.6 fL (ref 80.0–100.0)
Monocytes Absolute: 0.8 10*3/uL (ref 0.1–1.0)
Monocytes Relative: 8 %
Neutro Abs: 8.4 10*3/uL — ABNORMAL HIGH (ref 1.7–7.7)
Neutrophils Relative %: 79 %
Platelets: 366 10*3/uL (ref 150–400)
RBC: 3.66 MIL/uL — ABNORMAL LOW (ref 4.22–5.81)
RDW: 14.4 % (ref 11.5–15.5)
WBC: 10.6 10*3/uL — ABNORMAL HIGH (ref 4.0–10.5)
nRBC: 0 % (ref 0.0–0.2)

## 2021-08-05 LAB — PHOSPHORUS: Phosphorus: 4.1 mg/dL (ref 2.5–4.6)

## 2021-08-05 LAB — MAGNESIUM: Magnesium: 2.1 mg/dL (ref 1.7–2.4)

## 2021-08-05 NOTE — Discharge Summary (Incomplete)
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  I offered him the option of palliative care and hospice referral versus consideration of systemic treatment based on the molecular studies and PD-L1 expression. The patient is still interested in some form of treatment.  I will wait for the molecular studies before making final decision regarding his treatment. -hold Eliquis until decision made -6/ 7 s/p placement LEFT Pleurx cath by IR  -On discharge (hold) Methotrexate given his recurrent pleural effusion, places him at risk for Methotrexate toxicity.  PCP/oncologist to decide when/if to restart   Chronic  respiratory failure -continue 3L O2   OSA - CPAP per respiratory   Nondisplaced left anterior seventh rib fracture. -resume pain meds   Obesity BMI 41.73 kg/m Estimated body mass index is 41.73 kg/m as calculated from the following:   Height as of this encounter: _0  (1.88 m).   Weight as of this encounter: 147.4 kg.    Goals of care - 6/7 palliative consult: [name] to evaluate when patient follows up with Dr. Earlie Server for changing CODE STATUS to DNR at a minimum.  Patient will require HCPOA sooner rather than later    Discharge Diagnoses:  Principal Problem:   Dyspnea Active Problems:   Hypertension associated with diabetes ([place])   Obesity, unspecified   Pneumothorax on left   DM2 (diabetes mellitus, type 2) (HCC)   OSA (obstructive sleep apnea)   Rib pain   Discharge Condition: Guarded  Diet recommendation: Heart healthy/carb modified  Filed Weights   08/03/21 1116  Weight: (!)  147.4 kg    History of present illness:  71 y.o. WM PMHx Morbid obesity, HTN, DM Type II, CKD, COPD, OSA and NSCLC (follows with Dr. Mckinley Jewel).  Chronic respiratory failure with hypoxia, 3L O2 at home.  Hospitalized in May with enlarging pleural effusion.  He underwent thoracentesis and cytology showed metastatic carcinoma.  He was d/c'd to SNF and recently released from SNF to live with a "roommate".  Over the last few days he has noticed worsening dyspnea especially with exertion.  He also is c/o left lateral rib pain.     In the ER, patient had xray and CTA of his chest.  CT scan shows re-occurrence of the left pleural effusion.  ER MD consulted PCCM.  Patient unclear as of now if he would want repeat thoracentesis vs pleurx catheter.     Currently his only complaint is rib pain and is asking for oxycodone.  Not pleased with the 70m given by the ER MD.    Hospital Course:  See above  Procedures: 6/7 IR Placement of Pleurx drain on left side   Consultations: IR  Cultures  ***  Antibiotics ***   Discharge Exam: Vitals:   08/04/21 1650 08/04/21 1957 08/04/21 2310 08/05/21 0426  BP: 113/66 (!) 85/67 130/67 106/66  Pulse: 95 90  94  Resp:  18  18  Temp:  97.6 F (36.4 C)  98.7 F (37.1 C)  TempSrc:    Oral  SpO2: 91% 92%  93%  Weight:      Height:        General: *** Cardiovascular: ***  Respiratory: ***  Discharge Instructions  Discharge Instructions     Ambulatory Pleural Drainage Schedule   Complete by: As directed    Drain daily, up to max of 1L until patient is only able to drain out 198m. If <7074mfor 3 consecutive drains every other day, then call Interventional Radiology ([phone]) for evaluation and possible removal.      Allergies as of 08/05/2021       Reactions   Demerol [meperidine] Nausea And Vomiting, Other (See Comments)   Made the patient "violently sick"   Zocor [simvastatin] Nausea And Vomiting, Other (See Comments)   Made him very  jittery, also        Medication List     STOP taking these medications    lisinopril 20 MG tablet Commonly known as: ZESTRIL   methotrexate 2.5 MG tablet Commonly known as: RHEUMATREX       TAKE these medications    albuterol 108 (90 Base) MCG/ACT inhaler Commonly known as: VENTOLIN HFA Inhale 2 puffs into the lungs every 6 (six) hours as needed for wheezing. What changed: Another medication with the same name was changed. Make sure you understand how and when to take each.   albuterol (2.5 MG/3ML) 0.083% nebulizer solution Commonly known as: PROVENTIL Inhale 3 mLs (2.5 mg total) by nebulization every 4 (four) hours as needed for wheezing or shortness of breath. What changed: when to take this   apixaban 5 MG Tabs tablet Commonly known as: ELIQUIS Take 5 mg by mouth 2 (two) times daily.   aspirin EC 81 MG tablet Take 81 mg by mouth daily.   b complex vitamins capsule Take 1 capsule by mouth daily.   buPROPion 300 MG 24 hr tablet Commonly known as: WELLBUTRIN XL Take 300 mg by mouth daily.   busPIRone 15 MG tablet Commonly known as: BUSPAR Take 15 mg by mouth daily.   calcium-vitamin D 500-200 MG-UNIT tablet Commonly known as: OSCAL WITH D Take 1 tablet by mouth 2 (two) times daily with a meal.   carboxymethylcellulose 0.5 % Soln Commonly known as: REFRESH PLUS Place 1 drop into both eyes 4 (four) times daily as needed (dry eyes).   clobetasol ointment 0.05 % Commonly known as: TEMOVATE Apply 1 application. topically 2 (two) times daily as needed (for scars).   clotrimazole-betamethasone cream Commonly known as: LOTRISONE Apply 1 application. topically 2 (two) times daily as needed (fungus).   cycloSPORINE 0.05 % ophthalmic emulsion Commonly known as: RESTASIS Place 1 drop into both eyes 2 (two) times daily.   diltiazem 180 MG 24 hr capsule Commonly known as: CARDIZEM CD Take 180 mg by mouth daily.   DULoxetine 60 MG capsule Commonly known as:  CYMBALTA Take 1 capsule (60 mg total) by mouth 2 (two) times daily.   feeding supplement Liqd Take 237 mLs by mouth 3 (three) times daily between meals. What changed: when to take this   fluticasone 50 MCG/ACT nasal spray Commonly known as: FLONASE Place 2 sprays into both nostrils daily.   folic acid 1 MG tablet Commonly known as: FOLVITE Take 1 mg by mouth daily.   gabapentin 300 MG capsule Commonly known as: NEURONTIN Take 1 capsule (300 mg total) by mouth 2 (two) times daily.   GARLIC OIL PO Take 1 capsule by mouth daily.   guaiFENesin 600 MG 12 hr tablet Commonly known as: MUCINEX Take 1 tablet (600 mg total) by mouth 2 (two) times daily as needed for cough.   lurasidone 40  MG Tabs tablet Commonly known as: LATUDA Take 40 mg by mouth daily after supper.   melatonin 3 MG Tabs tablet Take 6 mg by mouth at bedtime.   metFORMIN 500 MG tablet Commonly known as: GLUCOPHAGE Take 500 mg by mouth 2 (two) times daily with a meal.   methocarbamol 500 MG tablet Commonly known as: ROBAXIN Take 1 tablet (500 mg total) by mouth every 8 (eight) hours as needed for muscle spasms.   Mometasone Furoate 200 MCG/ACT Aero Inhale 2 puffs into the lungs at bedtime.   mycophenolate 250 MG capsule Commonly known as: CELLCEPT Take 1,500 mg by mouth 2 (two) times daily.   nitroGLYCERIN 0.4 MG SL tablet Commonly known as: NITROSTAT Place 1 tablet (0.4 mg total) under the tongue every 5 (five) minutes as needed for chest pain.   OVER THE COUNTER MEDICATION Take 1 tablet by mouth at bedtime. Medication: Pure ZZZs sleep and destress   oxcarbazepine 600 MG tablet Commonly known as: TRILEPTAL Take 600-900 mg by mouth See admin instructions. Take 637m by mouth during the day, then take  900 mg by mouth at bedtime   oxyCODONE-acetaminophen 5-325 MG tablet Commonly known as: PERCOCET/ROXICET Take 1 tablet by mouth every 6 (six) hours as needed for severe pain.   OxyCONTIN 20 mg 12 hr  tablet Generic drug: oxyCODONE Take 20 mg by mouth every 12 (twelve) hours. What changed: Another medication with the same name was removed. Continue taking this medication, and follow the directions you see here.   OXYGEN Inhale 3 L into the lungs at bedtime.   pantoprazole 40 MG tablet Commonly known as: PROTONIX Take 1 tablet (40 mg total) by mouth 2 (two) times daily.   polyethylene glycol 17 g packet Commonly known as: MIRALAX / GLYCOLAX Take 17 g by mouth daily.   PRESCRIPTION MEDICATION Inhale into the lungs See admin instructions. CPAP- At bedtime and during during any naps   senna-docusate 8.6-50 MG tablet Commonly known as: Senokot-S Take 1 tablet by mouth 2 (two) times daily.   sodium chloride 0.65 % Soln nasal spray Commonly known as: OCEAN Place 2 sprays into both nostrils 4 (four) times daily as needed for congestion.   Striverdi Respimat 2.5 MCG/ACT Aers Generic drug: Olodaterol HCl Inhale 2 puffs into the lungs daily.   SYSTANE OP Place 1 drop into both eyes daily as needed (dry eye).   tamsulosin 0.4 MG Caps capsule Commonly known as: FLOMAX Take 0.4 mg by mouth daily.   traMADol 50 MG tablet Commonly known as: ULTRAM Take 2 tablets (100 mg total) by mouth at bedtime.   traZODone 50 MG tablet Commonly known as: DESYREL Take 75 mg by mouth at bedtime.   vitamin C 500 MG tablet Commonly known as: ASCORBIC ACID Take 500 mg by mouth 2 (two) times daily.   Vitamin D-3 25 MCG (1000 UT) Caps Take 1,000 Units by mouth 2 (two) times daily.       Allergies  Allergen Reactions   Demerol [Meperidine] Nausea And Vomiting and Other (See Comments)    Made the patient "violently sick"   Zocor [Simvastatin] Nausea And Vomiting and Other (See Comments)    Made him very jittery, also      The results of significant diagnostics from this hospitalization (including imaging, microbiology, ancillary and laboratory) are listed below for reference.     Significant Diagnostic Studies: IR PERC PLEURAL DRAIN W/INDWELL CATH W/IMG GUIDE  Result Date: 08/04/2021 INDICATION: Recurrent malignant left pleural effusion,  lung cancer EXAM: ULTRASOUND AND FLUOROSCOPIC LEFT CHEST TUNNELED PLEURAL DRAIN (PLEURX CATHETER) MEDICATIONS: 2 G ANCEF IV WITHIN 1 HOUR OF THE PROCEDURE ANESTHESIA/SEDATION: Moderate (conscious) sedation was employed during this procedure. A total of Versed 1.5 mg and Fentanyl 100 mcg was administered intravenously by the radiology nurse. Total intra-service moderate Sedation Time: 16 minutes. The patient's level of consciousness and vital signs were monitored continuously by radiology nursing throughout the procedure under my direct supervision. COMPLICATIONS: None immediate. PROCEDURE: Informed written consent was obtained from the patient after a thorough discussion of the procedural risks, benefits and alternatives. All questions were addressed. Maximal Sterile Barrier Technique was utilized including caps, mask, sterile gowns, sterile gloves, sterile drape, hand hygiene and skin antiseptic. A timeout was performed prior to the initiation of the procedure. Previous imaging reviewed. Patient positioned right side down decubitus. Preliminary ultrasound performed. The left effusion was localized and marked in the mid axillary line through a lower intercostal space. Under sterile conditions and local anesthesia, the percutaneous access needle was advanced under direct ultrasound through the lower intercostal space into the pleural fluid. Needle position confirmed with ultrasound. Images obtained for documentation. There was return of pleural fluid. Amplatz guidewire inserted into the pleural space. Under sterile conditions and local anesthesia, a subcutaneous tunnel was created in the adjacent anterior chest soft tissues. PleurX catheter was tunneled subcutaneously to the entry site and advanced into the pleural space through the peel-away sheath.  Position again confirmed with ultrasound and fluoroscopy. Following insertion, 1.2 L thoracentesis performed. Catheter secured with Prolene suture. Entry site closed with subcuticular Vicryl suture and Dermabond. Sterile dressing applied. No immediate complication. Patient tolerated the procedure well. IMPRESSION: Successful ultrasound and fluoroscopic left chest tunneled pleural drain (PleurX catheter. 1.2 L thoracentesis performed as well. Electronically Signed   By: Jerilynn Mages.  Shick M.D.   On: 08/04/2021 17:22   CT Angio Chest PE W and/or Wo Contrast  Result Date: 08/03/2021 CLINICAL DATA:  Pulmonary embolism (PE) suspected, high prob Worsening shortness of breath over the last 4 days. Left-sided chest pain. Patient with history of lung cancer. EXAM: CT ANGIOGRAPHY CHEST WITH CONTRAST TECHNIQUE: Multidetector CT imaging of the chest was performed using the standard protocol during bolus administration of intravenous contrast. Multiplanar CT image reconstructions and MIPs were obtained to evaluate the vascular anatomy. RADIATION DOSE REDUCTION: This exam was performed according to the departmental dose-optimization program which includes automated exposure control, adjustment of the mA and/or kV according to patient size and/or use of iterative reconstruction technique. CONTRAST:  31m OMNIPAQUE IOHEXOL 350 MG/ML SOLN COMPARISON:  Radiograph earlier today.  Chest CT 06/23/2021 FINDINGS: Cardiovascular: There are no filling defects within the pulmonary arteries to suggest pulmonary embolus. Portions of the left lower lobe subsegmental branches are attenuated and not well assessed. Additionally there is motion artifact obscuring the bases. Atherosclerosis of the thoracic aorta. No dissection or acute aortic findings. Coronary artery calcifications. The heart is normal in size. No pericardial effusion. Mediastinum/Nodes: Loculated pleural fluid tracks along the left aspect of the mediastinum. No enlarged mediastinal or  hilar lymph nodes. No axillary or supraclavicular adenopathy. Lungs/Pleura: Partially loculated left pleural effusion has progressed in size from CT 6 weeks ago, now moderate to large. Loculated component tracks anteriorly at the lung base and medially adjacent to the mediastinum. A bulla at the left lung base measures 6.4 cm, decreased in size from prior exam when it measured 8 cm, and is elevated related to adjacent pleural effusion. There is associated airspace  disease and/or atelectasis in the left lower and left upper lobe. Loculated fluid tracks into the inter lobar fissure on the left. Areas of pleural enhancement in the basilar hemithorax, series 5, image 123 and 128. Underlying emphysema. Bulla at the right lung base are unchanged. No acute right lung airspace disease. No right pleural effusion. Suspected basilar linear atelectasis, although obscured by motion. Upper Abdomen: Limited assessment of the upper abdomen demonstrates no acute findings. Musculoskeletal: There is a nondisplaced fracture of the left anterior seventh rib. No evidence of underlying lesion. Thoracic spondylosis. Review of the MIP images confirms the above findings. IMPRESSION: 1. No pulmonary embolus. 2. Moderate to large partially loculated left pleural effusion has progressed in size from CT 6 weeks ago. Loculated component tracks anteriorly at the lung base and medially adjacent to the mediastinum. There is associated airspace disease and/or atelectasis in the left lower and left upper lobe. A bulla at the left lung base has decreased in size from prior exam, and is elevated related to adjacent pleural fluid. Areas of pleural enhancement in the basilar hemithorax raises concern for underlying pleural based metastatic disease. 3. Nondisplaced left anterior seventh rib fracture. 4. Emphysema.  Stable size of right basilar bulla. 5. Aortic atherosclerosis.  Coronary artery calcifications. Aortic Atherosclerosis (ICD10-I70.0) and  Emphysema (ICD10-J43.9). Electronically Signed   By: Keith Rake M.D.   On: 08/03/2021 15:24   DG Chest 2 View  Result Date: 08/03/2021 CLINICAL DATA:  Shortness of breath. EXAM: CHEST - 2 VIEW COMPARISON:  07/04/2021 FINDINGS: Asymmetric elevation left hemidiaphragm with left base collapse/consolidation and probable effusion. Bleb at the left base is stable in the interval. The cardio pericardial silhouette is enlarged. Telemetry leads overlie the chest. IMPRESSION: Asymmetric elevation left hemidiaphragm with left base collapse/consolidation and probable effusion. Electronically Signed   By: Misty Stanley M.D.   On: 08/03/2021 11:49    Microbiology: Recent Results (from the past 240 hour(s))  Resp Panel by RT-PCR (Flu A&B, Covid) Anterior Nasal Swab     Status: None   Collection Time: 08/03/21  1:33 PM   Specimen: Anterior Nasal Swab  Result Value Ref Range Status   SARS Coronavirus 2 by RT PCR NEGATIVE NEGATIVE Final    Comment: (NOTE) SARS-CoV-2 target nucleic acids are NOT DETECTED.  The SARS-CoV-2 RNA is generally detectable in upper respiratory specimens during the acute phase of infection. The lowest concentration of SARS-CoV-2 viral copies this assay can detect is 138 copies/mL. A negative result does not preclude SARS-Cov-2 infection and should not be used as the sole basis for treatment or other patient management decisions. A negative result may occur with  improper specimen collection/handling, submission of specimen other than nasopharyngeal swab, presence of viral mutation(s) within the areas targeted by this assay, and inadequate number of viral copies(<138 copies/mL). A negative result must be combined with clinical observations, patient history, and epidemiological information. The expected result is Negative.  Fact Sheet for Patients:  EntrepreneurPulse.com.au  Fact Sheet for Healthcare Providers:   IncredibleEmployment.be  This test is no t yet approved or cleared by the [place] FDA and  has been authorized for detection and/or diagnosis of SARS-CoV-2 by FDA under an Emergency Use Authorization (EUA). This EUA will remain  in effect (meaning this test can be used) for the duration of the COVID-19 declaration under Section 564(b)(1) of the Act, 21 U.S.C.section 360bbb-3(b)(1), unless the authorization is terminated  or revoked sooner.       Influenza A by PCR NEGATIVE  NEGATIVE Final   Influenza B by PCR NEGATIVE NEGATIVE Final    Comment: (NOTE) The Xpert Xpress SARS-CoV-2/FLU/RSV plus assay is intended as an aid in the diagnosis of influenza from Nasopharyngeal swab specimens and should not be used as a sole basis for treatment. Nasal washings and aspirates are unacceptable for Xpert Xpress SARS-CoV-2/FLU/RSV testing.  Fact Sheet for Patients: EntrepreneurPulse.com.au  Fact Sheet for Healthcare Providers: IncredibleEmployment.be  This test is not yet approved or cleared by the [place] FDA and has been authorized for detection and/or diagnosis of SARS-CoV-2 by FDA under an Emergency Use Authorization (EUA). This EUA will remain in effect (meaning this test can be used) for the duration of the COVID-19 declaration under Section 564(b)(1) of the Act, 21 U.S.C. section 360bbb-3(b)(1), unless the authorization is terminated or revoked.  Performed at [facility], [place] [address] [place], [place] [address]      Labs: Basic Metabolic Panel: Recent Labs  Lab 08/03/21 1228 08/03/21 1422 08/04/21 0512 08/05/21 0543  NA 133*  --  136 136  K 4.7  --  4.3 4.3  CL 101  --  102 101  CO2 22  --  24 28  GLUCOSE 142*  --  134* 124*  BUN 9  --  11 13  CREATININE 0.88  --  0.86 0.97  CALCIUM 8.9  --  9.2 8.8*  MG  --  2.3  --  2.1  PHOS  --   --   --  4.1   Liver Function Tests: Recent  Labs  Lab 08/03/21 1422 08/05/21 0543  AST 33 14*  ALT 18 13  ALKPHOS 118 94  BILITOT 1.0 0.4  PROT 7.4 6.4*  ALBUMIN 3.9 3.3*   No results for input(s): "LIPASE", "AMYLASE" in the last 168 hours. No results for input(s): "AMMONIA" in the last 168 hours. CBC: Recent Labs  Lab 08/03/21 1228 08/04/21 0512 08/05/21 0543  WBC 11.1* 8.7 10.6*  NEUTROABS  --   --  8.4*  HGB 12.6* 11.9* 11.0*  HCT 39.6 37.7* 35.0*  MCV 93.8 93.8 95.6  PLT 425* 366 366   Cardiac Enzymes: No results for input(s): "CKTOTAL", "CKMB", "CKMBINDEX", "TROPONINI" in the last 168 hours. BNP: BNP (last 3 results) Recent Labs    05/21/21 2104 06/03/21 2335 06/14/21 0628  BNP 31.6 29.6 23.3    ProBNP (last 3 results) No results for input(s): "PROBNP" in the last 8760 hours.  CBG: No results for input(s): "GLUCAP" in the last 168 hours.     Signed:  Dia Crawford, MD [facility]

## 2021-08-05 NOTE — Progress Notes (Signed)
I received a message from [name] NP regarding Mr. Geerdes and his follow up. I updated her and sent a scheduling message to get him for a follow up in 2 weeks.

## 2021-08-05 NOTE — Progress Notes (Addendum)
Transferred of unit via [facility] at 2245.  Unable to transport case of pleurex cannister's, secondary to lack of space. Pt was able to to take one cannister.   Reviewed discharge plan  verbalized good  understanding

## 2021-08-05 NOTE — TOC Transition Note (Addendum)
Transition of Care [facility]) - CM/SW Discharge Note   Patient Details  Name: Mario [name] MRN: [id number] Date of Birth: Jan 31, 1951  Transition of Care [facility]) CM/SW Contact:  Vassie Moselle, [place] Phone Number: 08/05/2021, 11:05 AM   Clinical Narrative:    Pt is to discharge home with HHPT/OT/RN services provided through [facility]. Pt has a newly placed PleurX. Pt is to discharge with a box of 10 drainage canisters. Written order for ongoing Pleural drainage supplies has been signed and faxed to [facility]. Pt has no DME needs. Pt will need PTAR to return home. PTAR called at 1245.   Final next level of care: [place] Barriers to Discharge: Barriers Resolved   Patient Goals and CMS Choice Patient states their goals for this hospitalization and ongoing recovery are:: Return home   Choice offered to / list presented to : Patient  Discharge Placement                       Discharge Plan and Services In-house Referral: Clinical Social Work Discharge Planning Services: CM Consult Post Acute Care Choice: Home Health          DME Arranged: N/A         HH Arranged: PT, OT, RN [place] Agency: [place] Date [place]: 08/04/21 Time HH Agency Contacted: 75 Representative spoke with at [place]: [place] ([place]) Interventions     Readmission Risk Interventions    08/05/2021   11:02 AM 06/07/2021    4:15 PM  Readmission Risk Prevention Plan  Transportation Screening Complete Complete  Medication Review Press photographer) Complete Complete  PCP or Specialist appointment within 3-5 days of discharge Complete Complete  HRI or [place] Complete Complete  SW Recovery Care/Counseling Consult Complete Complete  Palliative Care Screening Not Applicable Not [place] Not Applicable Not Applicable

## 2021-08-05 NOTE — Progress Notes (Addendum)
NAME:  Mario [name], MRN:  [id number], DOB:  1950/07/13, LOS: 2 ADMISSION DATE:  08/03/2021, CONSULTATION DATE:  08/05/2021  REFERRING MD:  EDP Dixon, CHIEF COMPLAINT:  shortness of breath   History of Present Illness:  71 year old smoker with COPD on 3 L home oxygen and left malignant effusion admitted with shortness of breath for 4 days and left-sided chest pain. 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  He follows up with [facility].  He was admitted 05/2021 with left pleural effusion, thoracentesis showed metastatic adenocarcinoma.  Course was complicated by upper GI bleeding requiring transfusion.  He was discharged to rehab and eventually home he established with Dr. Julien Nordmann and chemotherapy is being considered but it was noted that his functional status is poor and it is likely he would not tolerate chemoradiation. During his previous admission Pleurx was deferred since it was found that he did not have symptomatic benefit from thoracentesis CT angiogram showed moderate to large partially loculated left effusion has progressed in size.  There were areas of pleural enhancement raising concern for metastatic disease.  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 Loculated component tracks anteriorly at the lung base and  medially adjacent to the mediastinum 6/7 IR placed left pleurx cath 1.2L removed  6/8 Complains of continued rib pain and endorses concern over discharge plan   Interim History / Subjective:  Complaints of right fractured rib pain  PleurX dressing c  Objective   Blood pressure 106/66, pulse 94, temperature 98.7 F (37.1 C), temperature source Oral, resp. rate 18, height _0  (1.88 m), weight (!) 147.4 kg, SpO2 93 %.       No intake or output data in the 24 hours ending 08/05/21 0856 Filed Weights   08/03/21 1116  Weight: (!) 147.4 kg    Examination: General: Acute on chronically ill appearing elderly male lying in bed, in NAD HEENT: [place]/AT, MM pink/moist, PERRL,  Neuro: Alert  CV: s1s2 regular rate and rhythm, no murmur, rubs, or gallops,  PULM:  Clear to ascultation, no increased work of breathing, no added breath sounds, Pleurx dressing C/D/I GI: soft, bowel sounds active in all 4 quadrants, non-tender, non-distended Extremities: warm/dry, no edema  Skin: no rashes or lesions  Resolved Hospital Problem list     Assessment & Plan:  Recurrent left malignant effusion -Seems to have recurred after thoracenteses 4/26 -Pleurx cath placed 6/7 per IR  -He will need to be set up with nursing services for drainage of Pleurx catheter at least once every week -Oncology to decide about treatment based on results of markers and pleural fluid/blood Rib fracture/left musculoskeletal pain P: Continue PlureX with every other day drain  Ensure adequate pain control Will need to follow up with Pulmonary and Oncology at discharge  Plan to drain pleux as [place] is able Will coordinate with TOC to ensure needed home health  paperwork for Pleurx management was completed    Best Practice (right click and "Reselect all SmartList Selections" daily)  Per primary  Signature:  Sobia Karger D. Kenton Kingfisher, NP-C [facility] Personal contact information can be found on Amion   08/05/2021, 9:04 AM

## 2021-08-05 NOTE — Progress Notes (Signed)
3:30pm-7pm: No changes in assessment findings from 7am-3pm shift. Patient awaiting pick-up by ambulance service. Emotional support provided to patient through active listening. [name], RN 08/05/21 7:45 PM

## 2021-08-05 NOTE — Progress Notes (Signed)
Patient continues to decline nocturnal CPAP

## 2021-08-06 ENCOUNTER — Telehealth: Payer: Self-pay | Admitting: Physician Assistant

## 2021-08-06 NOTE — Telephone Encounter (Signed)
.  Called pt per 6/8inbasket , Patient was unavailable, a message with appt time and date was left with number on file.

## 2021-08-07 ENCOUNTER — Encounter (HOSPITAL_COMMUNITY): Payer: Self-pay

## 2021-08-07 ENCOUNTER — Emergency Department (HOSPITAL_COMMUNITY): Payer: No Typology Code available for payment source

## 2021-08-07 ENCOUNTER — Other Ambulatory Visit: Payer: Self-pay

## 2021-08-07 ENCOUNTER — Inpatient Hospital Stay (HOSPITAL_COMMUNITY)
Admission: EM | Admit: 2021-08-07 | Discharge: 2021-08-10 | DRG: 180 | Disposition: A | Discharge status: Home Health | Payer: No Typology Code available for payment source | Attending: Internal Medicine | Admitting: Internal Medicine

## 2021-08-07 DIAGNOSIS — J9620 Acute and chronic respiratory failure, unspecified whether with hypoxia or hypercapnia: Secondary | ICD-10-CM | POA: Diagnosis present

## 2021-08-07 DIAGNOSIS — Z91018 Allergy to other foods: Secondary | ICD-10-CM | POA: Diagnosis not present

## 2021-08-07 DIAGNOSIS — F32A Depression, unspecified: Secondary | ICD-10-CM | POA: Diagnosis present

## 2021-08-07 DIAGNOSIS — Z888 Allergy status to other drugs, medicaments and biological substances status: Secondary | ICD-10-CM | POA: Diagnosis not present

## 2021-08-07 DIAGNOSIS — G4733 Obstructive sleep apnea (adult) (pediatric): Secondary | ICD-10-CM | POA: Diagnosis present

## 2021-08-07 DIAGNOSIS — Z85828 Personal history of other malignant neoplasm of skin: Secondary | ICD-10-CM

## 2021-08-07 DIAGNOSIS — Z6841 Body Mass Index (BMI) 40.0 and over, adult: Secondary | ICD-10-CM | POA: Diagnosis not present

## 2021-08-07 DIAGNOSIS — R531 Weakness: Secondary | ICD-10-CM | POA: Diagnosis not present

## 2021-08-07 DIAGNOSIS — Z885 Allergy status to narcotic agent status: Secondary | ICD-10-CM | POA: Diagnosis not present

## 2021-08-07 DIAGNOSIS — R0781 Pleurodynia: Secondary | ICD-10-CM | POA: Diagnosis not present

## 2021-08-07 DIAGNOSIS — S2232XA Fracture of one rib, left side, initial encounter for closed fracture: Secondary | ICD-10-CM | POA: Diagnosis present

## 2021-08-07 DIAGNOSIS — Z608 Other problems related to social environment: Secondary | ICD-10-CM | POA: Diagnosis present

## 2021-08-07 DIAGNOSIS — Z515 Encounter for palliative care: Secondary | ICD-10-CM

## 2021-08-07 DIAGNOSIS — I129 Hypertensive chronic kidney disease with stage 1 through stage 4 chronic kidney disease, or unspecified chronic kidney disease: Secondary | ICD-10-CM | POA: Diagnosis present

## 2021-08-07 DIAGNOSIS — J189 Pneumonia, unspecified organism: Secondary | ICD-10-CM | POA: Diagnosis present

## 2021-08-07 DIAGNOSIS — E1122 Type 2 diabetes mellitus with diabetic chronic kidney disease: Secondary | ICD-10-CM | POA: Diagnosis present

## 2021-08-07 DIAGNOSIS — Z20822 Contact with and (suspected) exposure to covid-19: Secondary | ICD-10-CM | POA: Diagnosis present

## 2021-08-07 DIAGNOSIS — J9611 Chronic respiratory failure with hypoxia: Secondary | ICD-10-CM | POA: Diagnosis present

## 2021-08-07 DIAGNOSIS — Z7982 Long term (current) use of aspirin: Secondary | ICD-10-CM

## 2021-08-07 DIAGNOSIS — C799 Secondary malignant neoplasm of unspecified site: Secondary | ICD-10-CM | POA: Diagnosis present

## 2021-08-07 DIAGNOSIS — Z7189 Other specified counseling: Secondary | ICD-10-CM

## 2021-08-07 DIAGNOSIS — C3492 Malignant neoplasm of unspecified part of left bronchus or lung: Principal | ICD-10-CM | POA: Diagnosis present

## 2021-08-07 DIAGNOSIS — J910 Malignant pleural effusion: Secondary | ICD-10-CM | POA: Diagnosis present

## 2021-08-07 DIAGNOSIS — Z7984 Long term (current) use of oral hypoglycemic drugs: Secondary | ICD-10-CM

## 2021-08-07 DIAGNOSIS — Z91014 Allergy to mammalian meats: Secondary | ICD-10-CM | POA: Diagnosis not present

## 2021-08-07 DIAGNOSIS — Z9981 Dependence on supplemental oxygen: Secondary | ICD-10-CM | POA: Diagnosis present

## 2021-08-07 DIAGNOSIS — N1831 Chronic kidney disease, stage 3a: Secondary | ICD-10-CM | POA: Diagnosis present

## 2021-08-07 DIAGNOSIS — Z923 Personal history of irradiation: Secondary | ICD-10-CM

## 2021-08-07 DIAGNOSIS — Z79899 Other long term (current) drug therapy: Secondary | ICD-10-CM

## 2021-08-07 DIAGNOSIS — Z86711 Personal history of pulmonary embolism: Secondary | ICD-10-CM

## 2021-08-07 DIAGNOSIS — E6609 Other obesity due to excess calories: Secondary | ICD-10-CM | POA: Diagnosis present

## 2021-08-07 DIAGNOSIS — J449 Chronic obstructive pulmonary disease, unspecified: Secondary | ICD-10-CM | POA: Diagnosis present

## 2021-08-07 DIAGNOSIS — I252 Old myocardial infarction: Secondary | ICD-10-CM | POA: Diagnosis not present

## 2021-08-07 DIAGNOSIS — Z1589 Genetic susceptibility to other disease: Secondary | ICD-10-CM | POA: Diagnosis not present

## 2021-08-07 DIAGNOSIS — J9621 Acute and chronic respiratory failure with hypoxia: Secondary | ICD-10-CM | POA: Diagnosis present

## 2021-08-07 DIAGNOSIS — X58XXXA Exposure to other specified factors, initial encounter: Secondary | ICD-10-CM | POA: Diagnosis present

## 2021-08-07 DIAGNOSIS — Z7901 Long term (current) use of anticoagulants: Secondary | ICD-10-CM

## 2021-08-07 DIAGNOSIS — Z87891 Personal history of nicotine dependence: Secondary | ICD-10-CM

## 2021-08-07 DIAGNOSIS — E669 Obesity, unspecified: Secondary | ICD-10-CM | POA: Diagnosis present

## 2021-08-07 LAB — COMPREHENSIVE METABOLIC PANEL
ALT: 14 U/L (ref 0–44)
AST: 16 U/L (ref 15–41)
Albumin: 3.5 g/dL (ref 3.5–5.0)
Alkaline Phosphatase: 93 U/L (ref 38–126)
Anion gap: 7 (ref 5–15)
BUN: 15 mg/dL (ref 8–23)
CO2: 27 mmol/L (ref 22–32)
Calcium: 9.1 mg/dL (ref 8.9–10.3)
Chloride: 100 mmol/L (ref 98–111)
Creatinine, Ser: 0.94 mg/dL (ref 0.61–1.24)
GFR, Estimated: >60 mL/min (ref >60)
Glucose, Bld: 105 mg/dL — ABNORMAL HIGH (ref 70–99)
Potassium: 4.5 mmol/L (ref 3.5–5.1)
Sodium: 134 mmol/L — ABNORMAL LOW (ref 135–145)
Total Bilirubin: 0.5 mg/dL (ref 0.3–1.2)
Total Protein: 6.7 g/dL (ref 6.5–8.1)

## 2021-08-07 LAB — RESPIRATORY PANEL BY PCR

## 2021-08-07 LAB — CREATININE, SERUM
Creatinine, Ser: 0.85 mg/dL (ref 0.61–1.24)
GFR, Estimated: >60 mL/min (ref >60)

## 2021-08-07 LAB — TROPONIN I (HIGH SENSITIVITY)
Troponin I (High Sensitivity): 6 ng/L (ref <18)
Troponin I (High Sensitivity): 6 ng/L (ref <18)

## 2021-08-07 LAB — RESP PANEL BY RT-PCR (FLU A&B, COVID) ARPGX2
Influenza A by PCR: NEGATIVE
Influenza B by PCR: NEGATIVE
SARS Coronavirus 2 by RT PCR: NEGATIVE

## 2021-08-07 LAB — CBC
HCT: 34.9 % — ABNORMAL LOW (ref 39.0–52.0)
HCT: 33.3 % — ABNORMAL LOW (ref 39.0–52.0)
Hemoglobin: 11.4 g/dL — ABNORMAL LOW (ref 13.0–17.0)
Hemoglobin: 10.6 g/dL — ABNORMAL LOW (ref 13.0–17.0)
MCH: 30.3 pg (ref 26.0–34.0)
MCH: 30.1 pg (ref 26.0–34.0)
MCHC: 32.7 g/dL (ref 30.0–36.0)
MCHC: 31.8 g/dL (ref 30.0–36.0)
MCV: 92.8 fL (ref 80.0–100.0)
MCV: 94.6 fL (ref 80.0–100.0)
Platelets: 361 10*3/uL (ref 150–400)
Platelets: 343 10*3/uL (ref 150–400)
RBC: 3.76 MIL/uL — ABNORMAL LOW (ref 4.22–5.81)
RBC: 3.52 MIL/uL — ABNORMAL LOW (ref 4.22–5.81)
RDW: 14.5 % (ref 11.5–15.5)
RDW: 14.5 % (ref 11.5–15.5)
WBC: 11.7 10*3/uL — ABNORMAL HIGH (ref 4.0–10.5)
WBC: 10.3 10*3/uL (ref 4.0–10.5)
nRBC: 0 % (ref 0.0–0.2)
nRBC: 0 % (ref 0.0–0.2)

## 2021-08-07 LAB — BLOOD GAS, ARTERIAL
Acid-Base Excess: 2.8 mmol/L — ABNORMAL HIGH (ref 0.0–2.0)
Bicarbonate: 28.5 mmol/L — ABNORMAL HIGH (ref 20.0–28.0)
O2 Saturation: 96.2 %
Patient temperature: 36.9
pCO2 arterial: 47 mmHg (ref 32–48)
pH, Arterial: 7.39 (ref 7.35–7.45)
pO2, Arterial: 72 mmHg — ABNORMAL LOW (ref 83–108)

## 2021-08-07 LAB — STREP PNEUMONIAE URINARY ANTIGEN: Strep Pneumo Urinary Antigen: NEGATIVE

## 2021-08-07 LAB — HIV ANTIBODY (ROUTINE TESTING W REFLEX): HIV Screen 4th Generation wRfx: NONREACTIVE

## 2021-08-07 LAB — PROCALCITONIN: Procalcitonin: <0.1 ng/mL

## 2021-08-07 LAB — LACTIC ACID, PLASMA: Lactic Acid, Venous: 1.2 mmol/L (ref 0.5–1.9)

## 2021-08-07 MED ORDER — FOLIC ACID 1 MG PO TABS
1.0000 mg | ORAL_TABLET | Freq: Every day | ORAL | Status: DC
  Administered 2021-08-07 – 2021-08-10 (×4): 1 mg via ORAL
  Filled 2021-08-07 (×4): qty 1

## 2021-08-07 MED ORDER — MORPHINE SULFATE (PF) 2 MG/ML IV SOLN
2.0000 mg | INTRAVENOUS | Status: DC | PRN
  Administered 2021-08-07 – 2021-08-10 (×15): 2 mg via INTRAVENOUS
  Filled 2021-08-07 (×15): qty 1

## 2021-08-07 MED ORDER — GABAPENTIN 300 MG PO CAPS
300.0000 mg | ORAL_CAPSULE | Freq: Two times a day (BID) | ORAL | Status: DC
  Administered 2021-08-07 – 2021-08-10 (×7): 300 mg via ORAL
  Filled 2021-08-07 (×6): qty 1

## 2021-08-07 MED ORDER — DEXTROSE-NACL 5-0.9 % IV SOLN: INTRAVENOUS | Status: DC

## 2021-08-07 MED ORDER — SODIUM CHLORIDE 0.9 % IV SOLN
2.0000 g | INTRAVENOUS | Status: DC
  Administered 2021-08-08: 2 g via INTRAVENOUS
  Filled 2021-08-07: qty 20

## 2021-08-07 MED ORDER — NITROGLYCERIN 0.4 MG SL SUBL: 0.4000 mg | SUBLINGUAL_TABLET | SUBLINGUAL | Status: DC | PRN

## 2021-08-07 MED ORDER — METHOCARBAMOL 500 MG PO TABS
500.0000 mg | ORAL_TABLET | Freq: Three times a day (TID) | ORAL | Status: DC | PRN
Start: 2021-08-07 — End: 2021-08-11
  Administered 2021-08-08 – 2021-08-10 (×6): 500 mg via ORAL
  Filled 2021-08-07 (×7): qty 1

## 2021-08-07 MED ORDER — ASPIRIN 81 MG PO TBEC
81.0000 mg | DELAYED_RELEASE_TABLET | Freq: Every day | ORAL | Status: DC
  Administered 2021-08-07 – 2021-08-10 (×4): 81 mg via ORAL
  Filled 2021-08-07 (×4): qty 1

## 2021-08-07 MED ORDER — OXYCODONE HCL ER 10 MG PO T12A
10.0000 mg | EXTENDED_RELEASE_TABLET | Freq: Two times a day (BID) | ORAL | Status: DC
  Administered 2021-08-07 – 2021-08-10 (×7): 10 mg via ORAL
  Filled 2021-08-07 (×7): qty 1

## 2021-08-07 MED ORDER — MYCOPHENOLATE MOFETIL 250 MG PO CAPS
1500.0000 mg | ORAL_CAPSULE | Freq: Two times a day (BID) | ORAL | Status: DC
  Administered 2021-08-07 – 2021-08-10 (×7): 1500 mg via ORAL
  Filled 2021-08-07 (×7): qty 6

## 2021-08-07 MED ORDER — ASCORBIC ACID 500 MG PO TABS
500.0000 mg | ORAL_TABLET | Freq: Two times a day (BID) | ORAL | Status: DC
  Administered 2021-08-07 – 2021-08-10 (×7): 500 mg via ORAL
  Filled 2021-08-07 (×7): qty 1

## 2021-08-07 MED ORDER — OXCARBAZEPINE 300 MG PO TABS
600.0000 mg | ORAL_TABLET | Freq: Every day | ORAL | Status: DC
  Administered 2021-08-08 – 2021-08-10 (×3): 600 mg via ORAL
  Filled 2021-08-07 (×3): qty 2

## 2021-08-07 MED ORDER — SODIUM CHLORIDE 0.9 % IV SOLN
500.0000 mg | INTRAVENOUS | Status: DC
  Administered 2021-08-08: 500 mg via INTRAVENOUS
  Filled 2021-08-07 (×2): qty 5

## 2021-08-07 MED ORDER — APIXABAN 5 MG PO TABS
5.0000 mg | ORAL_TABLET | Freq: Two times a day (BID) | ORAL | Status: DC
  Administered 2021-08-07 – 2021-08-10 (×7): 5 mg via ORAL
  Filled 2021-08-07 (×7): qty 1

## 2021-08-07 MED ORDER — CLOTRIMAZOLE 1 % EX CREA
TOPICAL_CREAM | Freq: Two times a day (BID) | CUTANEOUS | Status: DC
  Administered 2021-08-07: 1 via TOPICAL
  Filled 2021-08-07 (×2): qty 15

## 2021-08-07 MED ORDER — OXCARBAZEPINE 300 MG PO TABS
900.0000 mg | ORAL_TABLET | Freq: Every day | ORAL | Status: DC
  Administered 2021-08-07 – 2021-08-10 (×4): 900 mg via ORAL
  Filled 2021-08-07 (×5): qty 3

## 2021-08-07 MED ORDER — FLUTICASONE PROPIONATE 50 MCG/ACT NA SUSP
2.0000 | Freq: Every day | NASAL | Status: DC
  Administered 2021-08-07 – 2021-08-10 (×4): 2 via NASAL
  Filled 2021-08-07: qty 16

## 2021-08-07 MED ORDER — SODIUM CHLORIDE 0.9 % IV SOLN
500.0000 mg | INTRAVENOUS | Status: DC
  Administered 2021-08-07: 500 mg via INTRAVENOUS
  Filled 2021-08-07: qty 5

## 2021-08-07 MED ORDER — TRAZODONE HCL 50 MG PO TABS
75.0000 mg | ORAL_TABLET | Freq: Every day | ORAL | Status: DC
  Administered 2021-08-07 – 2021-08-10 (×4): 75 mg via ORAL
  Filled 2021-08-07 (×4): qty 2

## 2021-08-07 MED ORDER — CEFTRIAXONE SODIUM 2 G IJ SOLR
2.0000 g | Freq: Once | INTRAMUSCULAR | Status: AC
  Administered 2021-08-07: 2 g via INTRAVENOUS
  Filled 2021-08-07: qty 20

## 2021-08-07 MED ORDER — METHYLPREDNISOLONE SODIUM SUCC 125 MG IJ SOLR
60.0000 mg | Freq: Two times a day (BID) | INTRAMUSCULAR | Status: DC
  Administered 2021-08-07 – 2021-08-10 (×7): 60 mg via INTRAVENOUS
  Filled 2021-08-07 (×7): qty 2

## 2021-08-07 MED ORDER — ALBUTEROL SULFATE (2.5 MG/3ML) 0.083% IN NEBU: 2.5000 mg | INHALATION_SOLUTION | Freq: Four times a day (QID) | RESPIRATORY_TRACT | Status: DC | PRN

## 2021-08-07 MED ORDER — BUPROPION HCL ER (XL) 300 MG PO TB24
300.0000 mg | ORAL_TABLET | Freq: Every day | ORAL | Status: DC
  Administered 2021-08-07 – 2021-08-10 (×4): 300 mg via ORAL
  Filled 2021-08-07 (×4): qty 1

## 2021-08-07 MED ORDER — KETOROLAC TROMETHAMINE 15 MG/ML IJ SOLN
15.0000 mg | Freq: Three times a day (TID) | INTRAMUSCULAR | Status: DC | PRN
Start: 2021-08-07 — End: 2021-08-11
  Administered 2021-08-07 – 2021-08-10 (×6): 15 mg via INTRAVENOUS
  Filled 2021-08-07 (×6): qty 1

## 2021-08-07 MED ORDER — CLOBETASOL PROPIONATE 0.05 % EX OINT: 1.0000 "application " | TOPICAL_OINTMENT | Freq: Two times a day (BID) | CUTANEOUS | Status: DC | PRN

## 2021-08-07 MED ORDER — IPRATROPIUM-ALBUTEROL 0.5-2.5 (3) MG/3ML IN SOLN
3.0000 mL | Freq: Four times a day (QID) | RESPIRATORY_TRACT | Status: DC
  Administered 2021-08-07 – 2021-08-09 (×7): 3 mL via RESPIRATORY_TRACT
  Filled 2021-08-07 (×8): qty 3

## 2021-08-07 MED ORDER — ENOXAPARIN SODIUM 40 MG/0.4ML IJ SOSY
40.0000 mg | PREFILLED_SYRINGE | INTRAMUSCULAR | Status: DC
  Administered 2021-08-07: 40 mg via SUBCUTANEOUS
  Filled 2021-08-07: qty 0.4

## 2021-08-07 MED ORDER — ENSURE ENLIVE PO LIQD
237.0000 mL | Freq: Three times a day (TID) | ORAL | Status: DC
Start: 2021-08-07 — End: 2021-08-11
  Administered 2021-08-07 – 2021-08-10 (×10): 237 mL via ORAL

## 2021-08-07 MED ORDER — CYCLOSPORINE 0.05 % OP EMUL
1.0000 [drp] | Freq: Two times a day (BID) | OPHTHALMIC | Status: DC
  Administered 2021-08-07 – 2021-08-10 (×7): 1 [drp] via OPHTHALMIC
  Filled 2021-08-07 (×8): qty 30

## 2021-08-07 MED ORDER — SODIUM CHLORIDE 0.9 % IV BOLUS
500.0000 mL | Freq: Once | INTRAVENOUS | Status: AC
  Administered 2021-08-07: 500 mL via INTRAVENOUS

## 2021-08-07 MED ORDER — DULOXETINE HCL 60 MG PO CPEP
60.0000 mg | ORAL_CAPSULE | Freq: Two times a day (BID) | ORAL | Status: DC
  Administered 2021-08-07 – 2021-08-10 (×7): 60 mg via ORAL
  Filled 2021-08-07 (×7): qty 1

## 2021-08-07 MED ORDER — DILTIAZEM HCL ER COATED BEADS 180 MG PO CP24
180.0000 mg | ORAL_CAPSULE | Freq: Every day | ORAL | Status: DC
  Administered 2021-08-07 – 2021-08-10 (×4): 180 mg via ORAL
  Filled 2021-08-07 (×4): qty 1

## 2021-08-07 MED ORDER — TAMSULOSIN HCL 0.4 MG PO CAPS
0.4000 mg | ORAL_CAPSULE | Freq: Every day | ORAL | Status: DC
  Administered 2021-08-07 – 2021-08-10 (×4): 0.4 mg via ORAL
  Filled 2021-08-07 (×4): qty 1

## 2021-08-07 MED ORDER — OXCARBAZEPINE 300 MG PO TABS: 600.0000 mg | ORAL_TABLET | ORAL | Status: DC

## 2021-08-07 MED ORDER — ONDANSETRON 4 MG PO TBDP
4.0000 mg | ORAL_TABLET | Freq: Once | ORAL | Status: AC
Start: 2021-08-07 — End: 2021-08-07
  Administered 2021-08-07: 4 mg via ORAL
  Filled 2021-08-07: qty 1

## 2021-08-07 MED ORDER — HYDROMORPHONE HCL 1 MG/ML IJ SOLN
1.0000 mg | Freq: Once | INTRAMUSCULAR | Status: AC
  Administered 2021-08-07: 1 mg via INTRAVENOUS
  Filled 2021-08-07: qty 1

## 2021-08-07 NOTE — ED Notes (Signed)
Dr. Sedonia Small at the bedside

## 2021-08-07 NOTE — Consult Note (Signed)
Consultation Note Date: 08/07/2021   Patient Name: Mario [name]  DOB: 14-Apr-1950  MRN: [id number]  Age / Sex: 71 y.o., male  PCP: [facility] Referring Physician: Allie Bossier, MD  Reason for Consultation: Establishing goals of care  HPI/Patient Profile: 71 y.o. male  with past medical history of COPD/emphysema, 60-pack-year smoking history, diagnosed with lung cancer in May 2022 left lower lobe admitted on 08/07/2021    Clinical Assessment and Goals of Care: 71 year old gentleman who lives in an apartment with a roommate in [place], [place], follows with Dr. Earlie Server for lung cancer, history of COPD on 3 L of home oxygen, was recently admitted with left malignant effusion and shortness of breath and left-sided chest pain.  Was diagnosed with the lung cancer almost a year ago and has undergone radiation.  Also follows with [facility].  Was recently discharged from the hospital.  Palliative medicine team consulted for CODE STATUS and goals of care discussions. Patient seen in the emergency department.  Chart reviewed.  Discussed with Dr. Sherral Hammers.  Patient is sitting up in bed.  He does not think that the Pleurx catheter helped.  He complains of episodic shortness of breath none currently.  We discussed about his goals of care.  See below. Palliative medicine is specialized medical care for people living with serious illness. It focuses on providing relief from the symptoms and stress of a serious illness. The goal is to improve quality of life for both the patient and the family. Goals of care: Broad aims of medical therapy in relation to the patient's values and preferences. Our aim is to provide medical care aimed at enabling patients to achieve the goals that matter most to them, given the circumstances of their particular medical situation and their constraints.    NEXT OF KIN   Lives with room mate in [place].  Has a daughter who lives in [place], her name is [place], [phone].  SUMMARY OF RECOMMENDATIONS   Full code/full scope care At this time, the patient's goals of care are not towards consideration for palliative services.  He remains interested in any and all available interventions for his cancer, states that he is waiting on molecular markers studies to be made available for further treatment. Recommend continuation of current mode of care Recommend outpatient palliative services and/or follow-up with palliative services at the cancer center. Thank you for the consult.  Code Status/Advance Care Planning: Full code   Symptom Management:    Palliative Prophylaxis:  Frequent Pain Assessment  Additional Recommendations (Limitations, Scope, Preferences): Full Scope Treatment  Psycho-social/Spiritual:  Desire for further Chaplaincy support:yes Additional Recommendations: Caregiving  Support/Resources  Prognosis:  Unable to determine  Discharge Planning: To Be Determined      Primary Diagnoses: Present on Admission:  Acute on chronic respiratory failure ([facility])  Metastatic carcinoma (HCC)  Non-small cell lung cancer, left (HCC)  Chronic respiratory failure with hypoxia (HCC)  Obesity, unspecified  OSA (obstructive sleep apnea)  Non-small cell cancer of left lung ([place])  Obesity, Class III, BMI 40-49.9 (morbid obesity) (HCC)  Rib pain  HCAP (healthcare-associated pneumonia)   I have reviewed the medical record, interviewed the patient and family, and examined the patient. The following aspects are pertinent.  Past Medical History:  Diagnosis Date   Anxiety    Bronchitis    COPD (chronic obstructive pulmonary disease) ([place])    Depression    History of radiation therapy    Left lung- 10/05/20-10/15/20- Dr. Gery Pray   Hypertension    lung ca 09/2020   Mental disorder    MI (myocardial infarction) ([place])    ????   OSA  (obstructive sleep apnea)    Suicide attempt ([place])    Tension pneumothorax 06/27/2016   Uveitis    Social History   Socioeconomic History   Marital status: Single    Spouse name: Not on file   Number of children: Not on file   Years of education: Not on file   Highest education level: Not on file  Occupational History   Occupation: retired  Tobacco Use   Smoking status: Former    Packs/day: 1.00    Years: 35.00    Total pack years: 35.00    Types: Cigarettes    Quit date: 05/2016    Years since quitting: 5.1   Smokeless tobacco: Never  Vaping Use   Vaping Use: Never used  Substance and Sexual Activity   Alcohol use: No    Alcohol/week: 0.0 standard drinks of alcohol    Comment: denies use of any drugs or alcohol   Drug use: No   Sexual activity: Not on file  Other Topics Concern   Not on file  Social History Narrative   Not on file   Social Determinants of Health   Financial Resource Strain: Low Risk  (09/23/2020)   Overall Financial Resource Strain (CARDIA)    Difficulty of Paying Living Expenses: Not hard at all  Food Insecurity: Not on file  Transportation Needs: No Transportation Needs (09/23/2020)   PRAPARE - Hydrologist (Medical): No    Lack of Transportation (Non-Medical): No  Physical Activity: Not on file  Stress: No Stress Concern Present (09/23/2020)   [place]    Feeling of Stress : Not at all  Social Connections: Not on file   Family History  Problem Relation Age of Onset   Dementia Father    Scheduled Meds:  enoxaparin (LOVENOX) injection  40 mg Subcutaneous Q24H   ipratropium-albuterol  3 mL Nebulization QID   methylPREDNISolone (SOLU-MEDROL) injection  60 mg Intravenous Q12H   Continuous Infusions:  [START ON 08/08/2021] azithromycin     [START ON 08/08/2021] cefTRIAXone (ROCEPHIN)  IV     dextrose 5 % and 0.9% NaCl 75 mL/hr at 08/07/21 1042    PRN Meds:.ketorolac Medications Prior to Admission:  Prior to Admission medications   Medication Sig Start Date End Date Taking? Authorizing Provider  albuterol (PROVENTIL) (2.5 MG/3ML) 0.083% nebulizer solution Inhale 3 mLs (2.5 mg total) by nebulization every 4 (four) hours as needed for wheezing or shortness of breath. Patient taking differently: Take 2.5 mg by nebulization every 6 (six) hours as needed for wheezing or shortness of breath. 08/04/20 08/07/21 Yes Lavina Hamman, MD  albuterol (VENTOLIN HFA) 108 (90 Base) MCG/ACT inhaler Inhale 2 puffs into the lungs every 6 (six) hours as needed for wheezing. 06/02/20  Yes [provider]  apixaban (ELIQUIS) 5 MG TABS  tablet Take 5 mg by mouth 2 (two) times daily.   Yes [provider]  aspirin 81 MG EC tablet Take 81 mg by mouth daily.   Yes [provider]  b complex vitamins capsule Take 1 capsule by mouth daily.   Yes [provider]  buPROPion (WELLBUTRIN XL) 300 MG 24 hr tablet Take 300 mg by mouth daily. 02/05/20  Yes [provider]  busPIRone (BUSPAR) 15 MG tablet Take 15 mg by mouth daily.   Yes [provider]  calcium-vitamin D (OSCAL WITH D) 500-200 MG-UNIT tablet Take 1 tablet by mouth 2 (two) times daily with a meal.   Yes [provider]  carboxymethylcellulose (REFRESH PLUS) 0.5 % SOLN Place 1 drop into both eyes 4 (four) times daily as needed (dry eyes).   Yes [provider]  Cholecalciferol (VITAMIN D-3) 25 MCG (1000 UT) CAPS Take 1,000 Units by mouth 2 (two) times daily.   Yes [provider]  clobetasol ointment (TEMOVATE) 6.37 % Apply 1 application. topically 2 (two) times daily as needed (for scars).   Yes [provider]  clotrimazole-betamethasone (LOTRISONE) cream Apply 1 application. topically 2 (two) times daily as needed (fungus). 07/06/21  Yes Aline August, MD  cycloSPORINE (RESTASIS) 0.05 % ophthalmic emulsion Place 1 drop into  both eyes 2 (two) times daily.   Yes [provider]  diltiazem (CARDIZEM CD) 180 MG 24 hr capsule Take 180 mg by mouth daily.   Yes [provider]  DULoxetine (CYMBALTA) 60 MG capsule Take 1 capsule (60 mg total) by mouth 2 (two) times daily. 07/21/17  Yes Lavina Hamman, MD  feeding supplement (ENSURE ENLIVE / ENSURE PLUS) LIQD Take 237 mLs by mouth 3 (three) times daily between meals. Patient taking differently: Take 1 Bottle by mouth daily. 08/04/20  Yes Lavina Hamman, MD  fluticasone (FLONASE) 50 MCG/ACT nasal spray Place 2 sprays into both nostrils daily.    Yes [provider]  folic acid (FOLVITE) 1 MG tablet Take 1 mg by mouth daily.   Yes [provider]  gabapentin (NEURONTIN) 300 MG capsule Take 1 capsule (300 mg total) by mouth 2 (two) times daily. 07/06/21  Yes Aline August, MD  GARLIC OIL PO Take 1 capsule by mouth daily.   Yes [provider]  guaiFENesin (MUCINEX) 600 MG 12 hr tablet Take 1 tablet (600 mg total) by mouth 2 (two) times daily as needed for cough. 07/06/21  Yes Aline August, MD  lurasidone (LATUDA) 40 MG TABS tablet Take 40 mg by mouth daily after supper.   Yes [provider]  Melatonin 3 MG TABS Take 6 mg by mouth at bedtime.   Yes [provider]  metFORMIN (GLUCOPHAGE) 500 MG tablet Take 500 mg by mouth 2 (two) times daily with a meal.   Yes [provider]  methocarbamol (ROBAXIN) 500 MG tablet Take 1 tablet (500 mg total) by mouth every 8 (eight) hours as needed for muscle spasms. 07/06/21  Yes Aline August, MD  Mometasone Furoate 200 MCG/ACT AERO Inhale 2 puffs into the lungs at bedtime.   Yes [provider]  mycophenolate (CELLCEPT) 250 MG capsule Take 1,500 mg by mouth 2 (two) times daily.   Yes [provider]  nitroGLYCERIN (NITROSTAT) 0.4 MG SL tablet Place 1 tablet (0.4 mg total) under the tongue every 5 (five) minutes as needed for chest pain. 07/03/19  Yes Danford,  Suann Larry, MD  OVER THE COUNTER MEDICATION  Take 1 tablet by mouth at bedtime. Medication: Pure ZZZs sleep and destress   Yes [provider]  oxcarbazepine (TRILEPTAL) 600 MG tablet Take 600-900 mg by mouth See admin instructions. Take 600mg  by mouth during the day, then take  900 mg by mouth at bedtime 07/16/20  Yes [provider]  oxyCODONE (OXYCONTIN) 20 mg 12 hr tablet Take 20 mg by mouth every 12 (twelve) hours.   Yes [provider]  Polyethyl Glycol-Propyl Glycol (SYSTANE OP) Place 1 drop into both eyes daily as needed (dry eye).   Yes [provider]  polyethylene glycol (MIRALAX / GLYCOLAX) 17 g packet Take 17 g by mouth daily.   Yes [provider]  senna-docusate (SENOKOT-S) 8.6-50 MG tablet Take 1 tablet by mouth 2 (two) times daily. 07/06/21  Yes Aline August, MD  sodium chloride (OCEAN) 0.65 % SOLN nasal spray Place 2 sprays into both nostrils 4 (four) times daily as needed for congestion.   Yes [provider]  tamsulosin (FLOMAX) 0.4 MG CAPS capsule Take 0.4 mg by mouth daily.   Yes [provider]  traMADol (ULTRAM) 50 MG tablet Take 2 tablets (100 mg total) by mouth at bedtime. 07/06/21  Yes Aline August, MD  traZODone (DESYREL) 50 MG tablet Take 75 mg by mouth at bedtime.   Yes [provider]  vitamin C (ASCORBIC ACID) 500 MG tablet Take 500 mg by mouth 2 (two) times daily.   Yes [provider]  Olodaterol HCl (STRIVERDI RESPIMAT) 2.5 MCG/ACT AERS Inhale 2 puffs into the lungs daily.    [provider]  oxyCODONE-acetaminophen (PERCOCET/ROXICET) 5-325 MG tablet Take 1 tablet by mouth every 6 (six) hours as needed for severe pain. Patient not taking: Reported on 07/20/2021 07/06/21   Aline August, MD  OXYGEN Inhale 3 L into the lungs at bedtime.    [provider]  PRESCRIPTION MEDICATION Inhale into the lungs See admin instructions. CPAP- At bedtime and during during any naps     [provider]   Allergies  Allergen Reactions   Demerol [Meperidine] Nausea And Vomiting and Other (See Comments)    Made the patient "violently sick"   Zocor [Simvastatin] Nausea And Vomiting and Other (See Comments)    Made him very jittery, also   Beet [Beta Vulgaris] Nausea And Vomiting   Liver Nausea And Vomiting   Review of Systems + rib pain   Physical Exam Awake alert  No distress currently  S 1 S 2  Has dressing L rib cage area.  Abdomen non distended   Vital Signs: BP (!) 119/98   Pulse 96   Temp 98.3 F (36.8 C) (Oral)   Resp 16   Ht 6\' 2"  (1.88 m)   Wt (!) 147.4 kg   SpO2 95%   BMI 41.73 kg/m  Pain Scale: 0-10   Pain Score: 6    SpO2: SpO2: 95 % O2 Device:SpO2: 95 % O2 Flow Rate: .O2 Flow Rate (L/min): 3 L/min  IO: Intake/output summary: No intake or output data in the 24 hours ending 08/07/21 1602  LBM:   Baseline Weight: Weight: (!) 147.4 kg Most recent weight: Weight: (!) 147.4 kg     Palliative Assessment/Data:   PPS 50%  Time In: 1500 Time Out: 1600 Time Total: 60 min.  Greater than 50%  of this time was spent counseling and coordinating care related to the above assessment and plan.  Signed by: Loistine Chance, MD   Please contact  Palliative Medicine Team phone at [phone] for questions and concerns.  For individual provider: See Shea Evans

## 2021-08-07 NOTE — Plan of Care (Signed)

## 2021-08-07 NOTE — ED Triage Notes (Addendum)
Pt from home by [facility], reports left sided rib pain for 2 months, painful with movement and palpitation. Pt at baseline for hx of respiratory illness. Pt reports had a CT/CXR a few days ago and was confirmed rib fracture.   Pt reports here for SOB d/t rib fracture. Pt is on his normal 3L [place], pt in no distress, A&O x4. Recently d/c 6/8 for left plural effusion, SOB, and left-sided chest pain.

## 2021-08-07 NOTE — H&P (Signed)
[facility] History and Physical  [name] KVQ:[id number] DOB: [birth year] DOA: 08/07/2021  Referring physician:  PCP: [facility]   Chief Complaint: Increased SOB  HPI: Mario [name] is a  71 y.o. WM PMHx Morbid obesity, HTN, DM Type II, CKD, COPD, OSA and NSCLC (follows with Dr. Mckinley Jewel).  Chronic respiratory failure with hypoxia, 3L O2 at home.  Hospitalized in May with enlarging pleural effusion.  He underwent thoracentesis and cytology showed metastatic carcinoma.  He was d/c'd to SNF and recently released from SNF to live with a "roommate".  Patient just discharged on 08/05/2021 for recurrent LEFT side malignant pleural effusion s/p Pleurx cath placement.  Patient was discharged with home RN, home PT, home OT.     Patient here for concern that his lung is filling back up with fluid.  He has a history of a recurrent malignant pleural effusion.  He was recently admitted to the hospital and a catheter was placed.  He was drained during his last stay in the hospital and was going to have it drained again in 1 week.  He feels like it is full again.  Having some shortness of breath.  Not significantly worse than his baseline, explains he has a history of COPD.  He is having some left-sided rib pain.  Was told he has a rib fracture.  He was discharged 2 days ago, has had some issues with ambulating, feels weak, has had a few instances where he almost fell.    Review of Systems:  Covid vaccination;  Constitutional:  No weight loss, night sweats, Fevers, chills, fatigue.  HEENT:  Positive headaches, Difficulty swallowing,Tooth/dental problems,Sore throat,  No sneezing, itching, ear ache, nasal congestion, post nasal drip,  Cardio-vascular:  No chest pain, Orthopnea, PND, swelling in lower extremities, anasarca, dizziness, palpitations  GI:  No heartburn, indigestion, abdominal pain, nausea, vomiting, diarrhea, change in bowel habits, loss of appetite  Resp:   Positive shortness of breath with exertion or at rest. No excess mucus, no productive cough, No non-productive cough, No coughing up of blood.No change in color of mucus.No wheezing.No chest wall deformity  Skin:  no rash or lesions.  GU:  no dysuria, change in color of urine, no urgency or frequency. No flank pain.  Musculoskeletal:  No joint pain or swelling. No decreased range of motion. Positive back pain.  Psych:  No change in mood or affect. No depression or anxiety. No memory loss.   Past Medical History:  Diagnosis Date   Anxiety    Bronchitis    COPD (chronic obstructive pulmonary disease) ([place])    Depression    History of radiation therapy    Left lung- 10/05/20-10/15/20- Dr. Gery Pray   Hypertension    lung ca 09/2020   Mental disorder    MI (myocardial infarction) ([place])    ????   OSA (obstructive sleep apnea)    Suicide attempt [facility])    Tension pneumothorax 06/27/2016   Uveitis    Past Surgical History:  Procedure Laterality Date   BIOPSY  07/03/2021   Procedure: BIOPSY;  Surgeon: Otis Brace, MD;  Location: [facility];  Service: Gastroenterology;;   BRONCHIAL BIOPSY  07/30/2020   Procedure: BRONCHIAL BIOPSIES;  Surgeon: Garner Nash, DO;  Location: [facility];  Service: Pulmonary;;   BRONCHIAL BRUSHINGS  07/30/2020   Procedure: BRONCHIAL BRUSHINGS;  Surgeon: Garner Nash, DO;  Location: [facility];  Service: Pulmonary;;   BRONCHIAL NEEDLE ASPIRATION BIOPSY  07/30/2020  Procedure: BRONCHIAL NEEDLE ASPIRATION BIOPSIES;  Surgeon: Garner Nash, DO;  Location: [facility];  Service: Pulmonary;;   BRONCHIAL WASHINGS  07/30/2020   Procedure: BRONCHIAL WASHINGS;  Surgeon: Garner Nash, DO;  Location: [place] [facility];  Service: Pulmonary;;   CHEST TUBE INSERTION Left 06/27/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     ESOPHAGOGASTRODUODENOSCOPY N/A 07/03/2021   Procedure: ESOPHAGOGASTRODUODENOSCOPY (EGD);  Surgeon: Otis Brace, MD;  Location: [name];   Service: Gastroenterology;  Laterality: N/A;   IR PERC PLEURAL DRAIN W/INDWELL CATH W/IMG GUIDE  08/04/2021   SKIN CANCER EXCISION     VIDEO BRONCHOSCOPY WITH ENDOBRONCHIAL NAVIGATION Left 07/30/2020   Procedure: VIDEO BRONCHOSCOPY WITH ENDOBRONCHIAL NAVIGATION;  Surgeon: Garner Nash, DO;  Location: [place];  Service: Pulmonary;  Laterality: Left;   Social History:  reports that he quit smoking about 5 years ago. His smoking use included cigarettes. He has a 35.00 pack-year smoking history. He has never used smokeless tobacco. He reports that he does not drink alcohol and does not use drugs.  Allergies  Allergen Reactions   Demerol [Meperidine] Nausea And Vomiting and Other (See Comments)    Made the patient "violently sick"   Zocor [Simvastatin] Nausea And Vomiting and Other (See Comments)    Made him very jittery, also   Beet [Beta Vulgaris] Nausea And Vomiting   Liver Nausea And Vomiting    Family History  Problem Relation Age of Onset   Dementia Father     Prior to Admission medications   Medication Sig Start Date End Date Taking? Authorizing Provider  albuterol (PROVENTIL) (2.5 MG/3ML) 0.083% nebulizer solution Inhale 3 mLs (2.5 mg total) by nebulization every 4 (four) hours as needed for wheezing or shortness of breath. Patient taking differently: Take 2.5 mg by nebulization every 6 (six) hours as needed for wheezing or shortness of breath. 08/04/20 08/07/21 Yes Lavina Hamman, MD  albuterol (VENTOLIN HFA) 108 (90 Base) MCG/ACT inhaler Inhale 2 puffs into the lungs every 6 (six) hours as needed for wheezing. 06/02/20  Yes [provider]  apixaban (ELIQUIS) 5 MG TABS tablet Take 5 mg by mouth 2 (two) times daily.   Yes [provider]  aspirin 81 MG EC tablet Take 81 mg by mouth daily.   Yes [provider]  b complex vitamins capsule Take 1 capsule by mouth daily.   Yes [provider]  buPROPion (WELLBUTRIN XL) 300 MG 24 hr tablet Take 300  mg by mouth daily. 02/05/20  Yes [provider]  busPIRone (BUSPAR) 15 MG tablet Take 15 mg by mouth daily.   Yes [provider]  calcium-vitamin D (OSCAL WITH D) 500-200 MG-UNIT tablet Take 1 tablet by mouth 2 (two) times daily with a meal.   Yes [provider]  carboxymethylcellulose (REFRESH PLUS) 0.5 % SOLN Place 1 drop into both eyes 4 (four) times daily as needed (dry eyes).   Yes [provider]  Cholecalciferol (VITAMIN D-3) 25 MCG (1000 UT) CAPS Take 1,000 Units by mouth 2 (two) times daily.   Yes [provider]  clobetasol ointment (TEMOVATE) 3.84 % Apply 1 application. topically 2 (two) times daily as needed (for scars).   Yes [provider]  clotrimazole-betamethasone (LOTRISONE) cream Apply 1 application. topically 2 (two) times daily as needed (fungus). 07/06/21  Yes Aline August, MD  cycloSPORINE (RESTASIS) 0.05 % ophthalmic emulsion Place 1 drop into both eyes 2 (two) times daily.   Yes [provider]  diltiazem (CARDIZEM CD) 180  MG 24 hr capsule Take 180 mg by mouth daily.   Yes [provider]  DULoxetine (CYMBALTA) 60 MG capsule Take 1 capsule (60 mg total) by mouth 2 (two) times daily. 07/21/17  Yes Lavina Hamman, MD  feeding supplement (ENSURE ENLIVE / ENSURE PLUS) LIQD Take 237 mLs by mouth 3 (three) times daily between meals. Patient taking differently: Take 1 Bottle by mouth daily. 08/04/20  Yes Lavina Hamman, MD  fluticasone (FLONASE) 50 MCG/ACT nasal spray Place 2 sprays into both nostrils daily.    Yes [provider]  folic acid (FOLVITE) 1 MG tablet Take 1 mg by mouth daily.   Yes [provider]  gabapentin (NEURONTIN) 300 MG capsule Take 1 capsule (300 mg total) by mouth 2 (two) times daily. 07/06/21  Yes Aline August, MD  GARLIC OIL PO Take 1 capsule by mouth daily.   Yes [provider]  guaiFENesin (MUCINEX) 600 MG 12 hr tablet Take 1 tablet (600 mg total) by  mouth 2 (two) times daily as needed for cough. 07/06/21  Yes Aline August, MD  lurasidone (LATUDA) 40 MG TABS tablet Take 40 mg by mouth daily after supper.   Yes [provider]  Melatonin 3 MG TABS Take 6 mg by mouth at bedtime.   Yes [provider]  metFORMIN (GLUCOPHAGE) 500 MG tablet Take 500 mg by mouth 2 (two) times daily with a meal.   Yes [provider]  methocarbamol (ROBAXIN) 500 MG tablet Take 1 tablet (500 mg total) by mouth every 8 (eight) hours as needed for muscle spasms. 07/06/21  Yes Aline August, MD  Mometasone Furoate 200 MCG/ACT AERO Inhale 2 puffs into the lungs at bedtime.   Yes [provider]  mycophenolate (CELLCEPT) 250 MG capsule Take 1,500 mg by mouth 2 (two) times daily.   Yes [provider]  nitroGLYCERIN (NITROSTAT) 0.4 MG SL tablet Place 1 tablet (0.4 mg total) under the tongue every 5 (five) minutes as needed for chest pain. 07/03/19  Yes Danford, Suann Larry, MD  OVER THE COUNTER MEDICATION Take 1 tablet by mouth at bedtime. Medication: Pure ZZZs sleep and destress   Yes [provider]  oxcarbazepine (TRILEPTAL) 600 MG tablet Take 600-900 mg by mouth See admin instructions. Take 670m by mouth during the day, then take  900 mg by mouth at bedtime 07/16/20  Yes [provider]  oxyCODONE (OXYCONTIN) 20 mg 12 hr tablet Take 20 mg by mouth every 12 (twelve) hours.   Yes [provider]  Polyethyl Glycol-Propyl Glycol (SYSTANE OP) Place 1 drop into both eyes daily as needed (dry eye).   Yes [provider]  polyethylene glycol (MIRALAX / GLYCOLAX) 17 g packet Take 17 g by mouth daily.   Yes [provider]  senna-docusate (SENOKOT-S) 8.6-50 MG tablet Take 1 tablet by mouth 2 (two) times daily. 07/06/21  Yes AAline August MD  sodium chloride (OCEAN) 0.65 % SOLN nasal spray Place 2 sprays into both nostrils 4 (four) times daily as needed for congestion.   Yes [provider]  tamsulosin (FLOMAX) 0.4 MG CAPS capsule Take 0.4 mg by mouth daily.   Yes [provider]  traMADol (ULTRAM) 50 MG tablet Take 2 tablets (100 mg total) by mouth at bedtime. 07/06/21  Yes AAline August MD  traZODone (DESYREL) 50 MG tablet Take 75 mg by mouth at bedtime.   Yes [provider]  vitamin C (ASCORBIC ACID) 500 MG tablet Take  500 mg by mouth 2 (two) times daily.   Yes [provider]  Olodaterol HCl (STRIVERDI RESPIMAT) 2.5 MCG/ACT AERS Inhale 2 puffs into the lungs daily.    [provider]  oxyCODONE-acetaminophen (PERCOCET/ROXICET) 5-325 MG tablet Take 1 tablet by mouth every 6 (six) hours as needed for severe pain. Patient not taking: Reported on 07/20/2021 07/06/21   Aline August, MD  OXYGEN Inhale 3 L into the lungs at bedtime.    [provider]  PRESCRIPTION MEDICATION Inhale into the lungs See admin instructions. CPAP- At bedtime and during during any naps    [provider]     Consultants:  Palliative care  Procedures/Significant Events:  6/10 PCXR Decrease in volume of loculated left pleural effusion status post chest tube placement. 2. Persistent atelectasis and airspace consolidation involving the left lower lobe. 3. New airspace consolidation within the right lower lung.   I have personally reviewed and interpreted all radiology studies and my findings are as above.   VENTILATOR SETTINGS:    Cultures 6/10 influenza A/B negative 6/10 SARS coronavirus negative 6/10 respiratory virus panel pending 6/10 sputum pending 6/10 blood pending   Antimicrobials: Anti-infectives (From admission, onward)    Start     Dose/Rate Route Frequency Ordered Stop   08/08/21 0900  azithromycin (ZITHROMAX) 500 mg in sodium chloride 0.9 % 250 mL IVPB        500 mg 250 mL/hr over 60 Minutes Intravenous Every 24 hours 08/07/21 0956 08/13/21 0859   08/08/21 0800  cefTRIAXone (ROCEPHIN) 2 g in sodium chloride 0.9 % 100  mL IVPB        2 g 200 mL/hr over 30 Minutes Intravenous Every 24 hours 08/07/21 0956 08/13/21 0759   08/07/21 0715  cefTRIAXone (ROCEPHIN) 2 g in sodium chloride 0.9 % 100 mL IVPB        2 g 200 mL/hr over 30 Minutes Intravenous  Once 08/07/21 0705 08/07/21 0818   08/07/21 0715  azithromycin (ZITHROMAX) 500 mg in sodium chloride 0.9 % 250 mL IVPB  Status:  Discontinued        500 mg 250 mL/hr over 60 Minutes Intravenous Every 24 hours 08/07/21 0705 08/07/21 5916         Devices    LINES / TUBES:      Continuous Infusions:  azithromycin 500 mg (08/07/21 0820)    Physical Exam: Vitals:   08/07/21 0730 08/07/21 0745 08/07/21 0800 08/07/21 0900  BP: (!) 152/89 137/87 115/81 121/75  Pulse: 96 93 95 95  Resp: '18 16 16 16  ' Temp:      TempSrc:      SpO2: 92% 93% 93% 90%  Weight:      Height:        Wt Readings from Last 3 Encounters:  08/07/21 (!) 147.4 kg  08/03/21 (!) 147.4 kg  07/20/21 (!) 145.7 kg    General: Positive chronic respiratory distress Eyes: negative scleral hemorrhage, negative anisocoria, negative icterus ENT: Negative Runny nose, negative gingival bleeding, Neck:  Negative scars, masses, torticollis, lymphadenopathy, JVD Lungs: decreased breath sounds bilaterally LEFT >>>> RIGHT without wheezes or crackles Cardiovascular: Regular rate and rhythm without murmur gallop or rub normal S1 and S2 Abdomen: MORBIDLY OBESE, negative abdominal pain, nondistended, positive soft, bowel sounds, no rebound, no ascites, no appreciable mass Extremities: No significant cyanosis, clubbing, or edema bilateral lower extremities Musculoskeletal: Positive lumbar sacral back pain to palpation midline Skin: Negative rashes, lesions, ulcers Psychiatric:  Negative depression, negative anxiety,  negative fatigue, negative mania  Central nervous system:  Cranial nerves II through XII intact, tongue/uvula midline, all extremities muscle strength 5/5, sensation intact throughout,  negative dysarthria, negative expressive aphasia, negative receptive aphasia.        Labs on Admission:  Basic Metabolic Panel: Recent Labs  Lab 08/03/21 1228 08/03/21 1422 08/04/21 0512 08/05/21 0543 08/07/21 0604  NA 133*  --  136 136 134*  K 4.7  --  4.3 4.3 4.5  CL 101  --  102 101 100  CO2 22  --  '24 28 27  ' GLUCOSE 142*  --  134* 124* 105*  BUN 9  --  '11 13 15  ' CREATININE 0.88  --  0.86 0.97 0.94  CALCIUM 8.9  --  9.2 8.8* 9.1  MG  --  2.3  --  2.1  --   PHOS  --   --   --  4.1  --    Liver Function Tests: Recent Labs  Lab 08/03/21 1422 08/05/21 0543 08/07/21 0604  AST 33 14* 16  ALT '18 13 14  ' ALKPHOS 118 94 93  BILITOT 1.0 0.4 0.5  PROT 7.4 6.4* 6.7  ALBUMIN 3.9 3.3* 3.5   No results for input(s): "LIPASE", "AMYLASE" in the last 168 hours. No results for input(s): "AMMONIA" in the last 168 hours. CBC: Recent Labs  Lab 08/03/21 1228 08/04/21 0512 08/05/21 0543 08/07/21 0604  WBC 11.1* 8.7 10.6* 11.7*  NEUTROABS  --   --  8.4*  --   HGB 12.6* 11.9* 11.0* 11.4*  HCT 39.6 37.7* 35.0* 34.9*  MCV 93.8 93.8 95.6 92.8  PLT 425* 366 366 361   Cardiac Enzymes: No results for input(s): "CKTOTAL", "CKMB", "CKMBINDEX", "TROPONINI" in the last 168 hours.  BNP (last 3 results) Recent Labs    05/21/21 2104 06/03/21 2335 06/14/21 0628  BNP 31.6 29.6 23.3    ProBNP (last 3 results) No results for input(s): "PROBNP" in the last 8760 hours.  CBG: No results for input(s): "GLUCAP" in the last 168 hours.  Radiological Exams on Admission: DG Chest Port 1 View  Result Date: 08/07/2021 CLINICAL DATA:  Left-sided rib pain for 2 months. Painful with movement. Wrist sent rib fracture. EXAM: PORTABLE CHEST 1 VIEW COMPARISON:  CT angio chest 08/03/2021 FINDINGS: There is a left-sided chest tube in place with tip terminating along the left heart border. No pneumothorax identified. Loculated left pleural effusion appears decreased in volume from the previous exam.  Large bulla within the left base is in the projection of the loculated left pleural effusion. Decreased aeration to the left lower lobe is unchanged compatible with atelectasis and airspace consolidation. New airspace opacities within the right lower lung are identified. IMPRESSION: 1. Decrease in volume of loculated left pleural effusion status post chest tube placement. 2. Persistent atelectasis and airspace consolidation involving the left lower lobe. 3. New airspace consolidation within the right lower lung. Electronically Signed   By: Kerby Moors M.D.   On: 08/07/2021 06:54    EKG: Independently reviewed.   Assessment/Plan Active Problems:   Acute on chronic respiratory failure (HCC)   Obesity, unspecified   HLA B27 (HLA B27 positive)   OSA (obstructive sleep apnea)   HCAP (healthcare-associated pneumonia)   Non-small cell cancer of left lung (HCC)   Obesity, Class III, BMI 40-49.9 (morbid obesity) ([place])   Rib pain   Metastatic carcinoma (HCC)   Non-small cell lung cancer, left (HCC)   Chronic respiratory failure with hypoxia (  [place])  Recurrent LEFT pleural effusion - likely malignant -added [name] to treatment team-  saw 5/23: I had a lengthy discussion with the patient today about his condition and treatment options.  I offered him the option of palliative care and hospice referral versus consideration of systemic treatment based on the molecular studies and PD-L1 expression. The patient is still interested in some form of treatment.  I will wait for the molecular studies before making final decision regarding his treatment. -hold Eliquis until decision made -6/ 7 s/p placement LEFT Pleurx cath by IR  -On discharge (hold) Methotrexate given his recurrent pleural effusion, places him at risk for Methotrexate toxicity.  PCP/oncologist to decide when/if to restart   Metastatic carcinoma/NSCLC LEFT lung - Per Dr. Earlie Server palliative care and hospice referral versus consideration of  systemic treatment based on the molecular studies and PD-L1 expression. -Since patient not started on any treatment would appear he is not a good candidate for treatment - Follow-up with Dr. Earlie Server Oncology.  To discuss inpatient hospice vs residential hospice vs systemic treatment -Prior to discharge discussed at length patient's wishes was very confused as to if he should proceed with hospice.  Counseled that he could not just stay in the hospital long-term (very apprehensive about returning home). -6/10 attempted to contact Dr. Lorna Few oncology through chat, however he is unavailable.  We will attempt to contact again in the A.m. given that patient unable to remain out of the hospital for any length of time believe any further treatment is FUTILE CARE.  Believe patient is in his terminal phase of disease process. - 6/10 will proceed to have additional physician review case and if he agrees FUTILE CARE all aggressive care will be discontinued and patient will be offered hospice care. -6/10 patient has agreed to discuss residential hospice with palliative care  Pain control -  HCAP -6/10 reviewed PCXR do not believe patient has pneumonia.  Negative fever, negative leukocytosis (even after steroid dose).  The patient's admission patient more on his ongoing progressive NSCLC. - 6/10 we will continue antibiotic for now.  Lactic acid/procalcitonin pending. - DuoNeb QID - Flutter valve - Incentive spirometry -Solu-Medrol 60 mg BID   Acute on Chronic  respiratory failure with Hypoxia  -continue 3L O2   OSA - CPAP per respiratory   Nondisplaced left anterior seventh rib fracture. -resume pain meds   Obesity BMI 41.73 kg/m Estimated body mass index is 41.73 kg/m as calculated from the following:   Height as of this encounter: '6\' 2"'  (1.88 m).   Weight as of this encounter: 147.4 kg.    Goals of care - 6/7 palliative consult: [name] to evaluate when patient follows up  with Dr. Earlie Server for changing CODE STATUS to DNR at a minimum.  Patient will require HCPOA sooner rather than later -6/9 Palliative Care consult: Spoke with Dr. Rowe Pavy Palliative Care who will see patient again, just discharged 48 hours ago for the exact same reason.  SOB/rib pain.  Likelihood of patient making any meaningful recovery HIGHLY UNLIKELY as evidence that he was only able to stay out of the hospital for 48 hours.  Highly recommend residential hospice.     Mobility Assessment (last 72 hours)     Mobility Assessment   No documentation.           Code Status: Full (DVT Prophylaxis: Eliquis Family Communication:   Status is: Inpatient    Dispo: The patient is from: Home  Anticipated d/c is to: Residential hospice              Anticipated d/c date is: > 3 days              Patient currently is not medically stable to d/c.     Data Reviewed: Care during the described time interval was provided by me .  I have reviewed this patient's available data, including medical history, events of note, physical examination, and all test results as part of my evaluation.   The patient is critically ill with multiple organ systems failure and requires high complexity decision making for assessment and support, frequent evaluation and titration of therapies, application of advanced monitoring technologies and extensive interpretation of multiple databases. Critical Care Time devoted to patient care services described in this note  Time spent: 70 minutes   Gisel Vipond, [facility]

## 2021-08-07 NOTE — ED Notes (Signed)
Pt reposition in bed for comfort and given a pillow

## 2021-08-07 NOTE — ED Notes (Signed)
Tube 2/2 for pertussis testing sent by this nurse at this time to lab

## 2021-08-07 NOTE — Discharge Summary (Addendum)
Physician Discharge Summary  Mario [name] QQP:[id number] DOB: Feb 24, 1951 DOA: 08/07/2021  PCP: [facility], [facility]  Admit date: 08/07/2021 Discharge date: 08/05/2021  Time spent: 35 minutes  Recommendations for Outpatient Follow-up:   Recurrent LEFT pleural effusion - likely malignant -added Mario [name] to treatment team-  saw 5/23: I had a lengthy discussion with the patient today about his condition and treatment options.  I offered him the option of palliative care and hospice referral versus consideration of systemic treatment based on the molecular studies and PD-L1 expression. The patient is still interested in some form of treatment.  I will wait for the molecular studies before making final decision regarding his treatment. -hold Eliquis until decision made -6/ 7 s/p placement LEFT Pleurx cath by IR  -On discharge (hold) Methotrexate given his recurrent pleural effusion, places him at risk for Methotrexate toxicity.  PCP/oncologist to decide when/if to restart  Metastatic carcinoma/NSCLC LEFT lung - Per Dr. Earlie [name] palliative care and hospice referral versus consideration of systemic treatment based on the molecular studies and PD-L1 expression. -Since patient not started on any treatment would appear he is not a good candidate for treatment - Follow-up with Dr. Earlie [name] Oncology.  To discuss inpatient hospice vs residential hospice vs systemic treatment -Prior to discharge discussed at length patient's wishes was very confused as to if he should proceed with hospice.  Counseled that he could not just stay in the hospital long-term (very apprehensive about returning home).   Chronic  respiratory failure -continue 3L O2   OSA - CPAP per respiratory   Nondisplaced left anterior seventh rib fracture. -resume pain meds   Obesity BMI 41.73 kg/m Estimated body mass index is 41.73 kg/m as calculated from the following:   Height as of this encounter: '6\' 2"'  (1.88 m).   Weight  as of this encounter: 147.4 kg.    Goals of care - 6/7 palliative consult: Mario [name] to evaluate when patient follows up with Dr. Earlie [name] for changing CODE STATUS to DNR at a minimum.  Patient will require HCPOA sooner rather than later    Discharge Diagnoses:  Active Problems:   Acute on chronic respiratory failure (HCC)   Obesity, unspecified   HLA B27 (HLA B27 positive)   OSA (obstructive sleep apnea)   Non-small cell cancer of left lung (HCC)   Obesity, Class III, BMI 40-49.9 (morbid obesity) (HCC)   Rib pain   Metastatic carcinoma (HCC)   Non-small cell lung cancer, left (HCC)   Chronic respiratory failure with hypoxia [facility])   Discharge Condition: Guarded  Diet recommendation: Heart healthy/carb modified  Filed Weights   08/07/21 0444  Weight: (!) 147.4 kg    History of present illness:  71 y.o. WM PMHx Morbid obesity, HTN, DM Type II, CKD, COPD, OSA and NSCLC (follows with Dr. Mckinley [name]).  Chronic respiratory failure with hypoxia, 3L O2 at home.  Hospitalized in May with enlarging pleural effusion.  He underwent thoracentesis and cytology showed metastatic carcinoma.  He was d/c'd to SNF and recently released from SNF to live with a "roommate".  Over the last few days he has noticed worsening dyspnea especially with exertion.  He also is c/o left lateral rib pain.     In the ER, patient had xray and CTA of his chest.  CT scan shows re-occurrence of the left pleural effusion.  ER MD consulted PCCM.  Patient unclear as of now if he would want repeat thoracentesis vs pleurx catheter.     Currently his  only complaint is rib pain and is asking for oxycodone.  Not pleased with the 64m given by the ER MD.    Hospital Course:  See above  Procedures: 6/7 IR Placement of Pleurx drain on left side   Consultations: IR  Cultures    Antibiotics Anti-infectives (From admission, onward)    Start     Ordered Stop   08/07/21 0715  cefTRIAXone (ROCEPHIN) 2 g in sodium  chloride 0.9 % 100 mL IVPB        08/07/21 0705 08/07/21 0818   08/07/21 0715  azithromycin (ZITHROMAX) 500 mg in sodium chloride 0.9 % 250 mL IVPB        08/07/21 0705           Discharge Exam: Vitals:   08/07/21 0730 08/07/21 0745 08/07/21 0800 08/07/21 0900  BP: (!) 152/89 137/87 115/81 121/75  Pulse: 96 93 95 95  Resp: '18 16 16 16  ' Temp:      TempSrc:      SpO2: 92% 93% 93% 90%  Weight:      Height:        General: A/O x4, No acute respiratory distress Eyes: negative scleral hemorrhage, negative anisocoria, negative icterus ENT: Negative Runny nose, negative gingival bleeding, Neck:  Negative scars, masses, torticollis, lymphadenopathy, JVD Lungs: absent breath sounds left lung field, positive clear to auscultation right lung field  without wheezes or crackles Cardiovascular: Regular rate and rhythm without murmur gallop or rub normal S1 and S2  Discharge Instructions     Allergies  Allergen Reactions   Demerol [Meperidine] Nausea And Vomiting and Other (See Comments)    Made the patient "violently sick"   Zocor [Simvastatin] Nausea And Vomiting and Other (See Comments)    Made him very jittery, also   Beet [Beta Vulgaris] Nausea And Vomiting   Liver Nausea And Vomiting      The results of significant diagnostics from this hospitalization (including imaging, microbiology, ancillary and laboratory) are listed below for reference.    Significant Diagnostic Studies: DG Chest Port 1 View  Result Date: 08/07/2021 CLINICAL DATA:  Left-sided rib pain for 2 months. Painful with movement. Wrist sent rib fracture. EXAM: PORTABLE CHEST 1 VIEW COMPARISON:  CT angio chest 08/03/2021 FINDINGS: There is a left-sided chest tube in place with tip terminating along the left heart border. No pneumothorax identified. Loculated left pleural effusion appears decreased in volume from the previous exam. Large bulla within the left base is in the projection of the loculated left pleural  effusion. Decreased aeration to the left lower lobe is unchanged compatible with atelectasis and airspace consolidation. New airspace opacities within the right lower lung are identified. IMPRESSION: 1. Decrease in volume of loculated left pleural effusion status post chest tube placement. 2. Persistent atelectasis and airspace consolidation involving the left lower lobe. 3. New airspace consolidation within the right lower lung. Electronically Signed   By: TKerby MoorsM.D.   On: 08/07/2021 06:54   IR PERC PLEURAL DRAIN W/INDWELL CATH W/IMG GUIDE  Result Date: 08/04/2021 INDICATION: Recurrent malignant left pleural effusion, lung cancer EXAM: ULTRASOUND AND FLUOROSCOPIC LEFT CHEST TUNNELED PLEURAL DRAIN (PLEURX CATHETER) MEDICATIONS: 2 G ANCEF IV WITHIN 1 HOUR OF THE PROCEDURE ANESTHESIA/SEDATION: Moderate (conscious) sedation was employed during this procedure. A total of Versed 1.5 mg and Fentanyl 100 mcg was administered intravenously by the radiology nurse. Total intra-service moderate Sedation Time: 16 minutes. The patient's level of consciousness and vital signs were monitored continuously by radiology nursing  throughout the procedure under my direct supervision. COMPLICATIONS: None immediate. PROCEDURE: Informed written consent was obtained from the patient after a thorough discussion of the procedural risks, benefits and alternatives. All questions were addressed. Maximal Sterile Barrier Technique was utilized including caps, mask, sterile gowns, sterile gloves, sterile drape, hand hygiene and skin antiseptic. A timeout was performed prior to the initiation of the procedure. Previous imaging reviewed. Patient positioned right side down decubitus. Preliminary ultrasound performed. The left effusion was localized and marked in the mid axillary line through a lower intercostal space. Under sterile conditions and local anesthesia, the percutaneous access needle was advanced under direct ultrasound through  the lower intercostal space into the pleural fluid. Needle position confirmed with ultrasound. Images obtained for documentation. There was return of pleural fluid. Amplatz guidewire inserted into the pleural space. Under sterile conditions and local anesthesia, a subcutaneous tunnel was created in the adjacent anterior chest soft tissues. PleurX catheter was tunneled subcutaneously to the entry site and advanced into the pleural space through the peel-away sheath. Position again confirmed with ultrasound and fluoroscopy. Following insertion, 1.2 L thoracentesis performed. Catheter secured with Prolene suture. Entry site closed with subcuticular Vicryl suture and Dermabond. Sterile dressing applied. No immediate complication. Patient tolerated the procedure well. IMPRESSION: Successful ultrasound and fluoroscopic left chest tunneled pleural drain (PleurX catheter. 1.2 L thoracentesis performed as well. Electronically Signed   By: Jerilynn Mages.  Shick M.D.   On: 08/04/2021 17:22   CT Angio Chest PE W and/or Wo Contrast  Result Date: 08/03/2021 CLINICAL DATA:  Pulmonary embolism (PE) suspected, high prob Worsening shortness of breath over the last 4 days. Left-sided chest pain. Patient with history of lung cancer. EXAM: CT ANGIOGRAPHY CHEST WITH CONTRAST TECHNIQUE: Multidetector CT imaging of the chest was performed using the standard protocol during bolus administration of intravenous contrast. Multiplanar CT image reconstructions and MIPs were obtained to evaluate the vascular anatomy. RADIATION DOSE REDUCTION: This exam was performed according to the departmental dose-optimization program which includes automated exposure control, adjustment of the mA and/or kV according to patient size and/or use of iterative reconstruction technique. CONTRAST:  40m OMNIPAQUE IOHEXOL 350 MG/ML SOLN COMPARISON:  Radiograph earlier today.  Chest CT 06/23/2021 FINDINGS: Cardiovascular: There are no filling defects within the pulmonary  arteries to suggest pulmonary embolus. Portions of the left lower lobe subsegmental branches are attenuated and not well assessed. Additionally there is motion artifact obscuring the bases. Atherosclerosis of the thoracic aorta. No dissection or acute aortic findings. Coronary artery calcifications. The heart is normal in size. No pericardial effusion. Mediastinum/Nodes: Loculated pleural fluid tracks along the left aspect of the mediastinum. No enlarged mediastinal or hilar lymph nodes. No axillary or supraclavicular adenopathy. Lungs/Pleura: Partially loculated left pleural effusion has progressed in size from CT 6 weeks ago, now moderate to large. Loculated component tracks anteriorly at the lung base and medially adjacent to the mediastinum. A bulla at the left lung base measures 6.4 cm, decreased in size from prior exam when it measured 8 cm, and is elevated related to adjacent pleural effusion. There is associated airspace disease and/or atelectasis in the left lower and left upper lobe. Loculated fluid tracks into the inter lobar fissure on the left. Areas of pleural enhancement in the basilar hemithorax, series 5, image 123 and 128. Underlying emphysema. Bulla at the right lung base are unchanged. No acute right lung airspace disease. No right pleural effusion. Suspected basilar linear atelectasis, although obscured by motion. Upper Abdomen: Limited assessment of the upper  abdomen demonstrates no acute findings. Musculoskeletal: There is a nondisplaced fracture of the left anterior seventh rib. No evidence of underlying lesion. Thoracic spondylosis. Review of the MIP images confirms the above findings. IMPRESSION: 1. No pulmonary embolus. 2. Moderate to large partially loculated left pleural effusion has progressed in size from CT 6 weeks ago. Loculated component tracks anteriorly at the lung base and medially adjacent to the mediastinum. There is associated airspace disease and/or atelectasis in the left  lower and left upper lobe. A bulla at the left lung base has decreased in size from prior exam, and is elevated related to adjacent pleural fluid. Areas of pleural enhancement in the basilar hemithorax raises concern for underlying pleural based metastatic disease. 3. Nondisplaced left anterior seventh rib fracture. 4. Emphysema.  Stable size of right basilar bulla. 5. Aortic atherosclerosis.  Coronary artery calcifications. Aortic Atherosclerosis (ICD10-I70.0) and Emphysema (ICD10-J43.9). Electronically Signed   By: Keith Rake M.D.   On: 08/03/2021 15:24   DG Chest 2 View  Result Date: 08/03/2021 CLINICAL DATA:  Shortness of breath. EXAM: CHEST - 2 VIEW COMPARISON:  07/04/2021 FINDINGS: Asymmetric elevation left hemidiaphragm with left base collapse/consolidation and probable effusion. Bleb at the left base is stable in the interval. The cardio pericardial silhouette is enlarged. Telemetry leads overlie the chest. IMPRESSION: Asymmetric elevation left hemidiaphragm with left base collapse/consolidation and probable effusion. Electronically Signed   By: Misty Stanley M.D.   On: 08/03/2021 11:49    Microbiology: Recent Results (from the past 240 hour(s))  Resp Panel by RT-PCR (Flu A&B, Covid) Anterior Nasal Swab     Status: None   Collection Time: 08/03/21  1:33 PM   Specimen: Anterior Nasal Swab  Result Value Ref Range Status   SARS Coronavirus 2 by RT PCR NEGATIVE NEGATIVE Final    Comment: (NOTE) SARS-CoV-2 target nucleic acids are NOT DETECTED.  The SARS-CoV-2 RNA is generally detectable in upper respiratory specimens during the acute phase of infection. The lowest concentration of SARS-CoV-2 viral copies this assay can detect is 138 copies/mL. A negative result does not preclude SARS-Cov-2 infection and should not be used as the sole basis for treatment or other patient management decisions. A negative result may occur with  improper specimen collection/handling, submission of specimen  other than nasopharyngeal swab, presence of viral mutation(s) within the areas targeted by this assay, and inadequate number of viral copies(<138 copies/mL). A negative result must be combined with clinical observations, patient history, and epidemiological information. The expected result is Negative.  Fact Sheet for Patients:  EntrepreneurPulse.com.au  Fact Sheet for Healthcare Providers:  IncredibleEmployment.be  This test is no t yet approved or cleared by the [place] FDA and  has been authorized for detection and/or diagnosis of SARS-CoV-2 by FDA under an Emergency Use Authorization (EUA). This EUA will remain  in effect (meaning this test can be used) for the duration of the COVID-19 declaration under Section 564(b)(1) of the Act, 21 U.S.C.section 360bbb-3(b)(1), unless the authorization is terminated  or revoked sooner.       Influenza A by PCR NEGATIVE NEGATIVE Final   Influenza B by PCR NEGATIVE NEGATIVE Final    Comment: (NOTE) The Xpert Xpress SARS-CoV-2/FLU/RSV plus assay is intended as an aid in the diagnosis of influenza from Nasopharyngeal swab specimens and should not be used as a sole basis for treatment. Nasal washings and aspirates are unacceptable for Xpert Xpress SARS-CoV-2/FLU/RSV testing.  Fact Sheet for Patients: EntrepreneurPulse.com.au  Fact Sheet for Healthcare Providers: IncredibleEmployment.be  This test is not yet approved or cleared by the [place] and has been authorized for detection and/or diagnosis of SARS-CoV-2 by FDA under an Emergency Use Authorization (EUA). This EUA will remain in effect (meaning this test can be used) for the duration of the COVID-19 declaration under Section 564(b)(1) of the Act, 21 U.S.C. section 360bbb-3(b)(1), unless the authorization is terminated or revoked.  Performed at [facility], [place] [address], [place], [place] [address]      Labs: Basic Metabolic Panel: Recent Labs  Lab 08/03/21 1228 08/03/21 1422 08/04/21 0512 08/05/21 0543 08/07/21 0604  NA 133*  --  136 136 134*  K 4.7  --  4.3 4.3 4.5  CL 101  --  102 101 100  CO2 22  --  '24 28 27  ' GLUCOSE 142*  --  134* 124* 105*  BUN 9  --  '11 13 15  ' CREATININE 0.88  --  0.86 0.97 0.94  CALCIUM 8.9  --  9.2 8.8* 9.1  MG  --  2.3  --  2.1  --   PHOS  --   --   --  4.1  --    Liver Function Tests: Recent Labs  Lab 08/03/21 1422 08/05/21 0543 08/07/21 0604  AST 33 14* 16  ALT '18 13 14  ' ALKPHOS 118 94 93  BILITOT 1.0 0.4 0.5  PROT 7.4 6.4* 6.7  ALBUMIN 3.9 3.3* 3.5   No results for input(s): "LIPASE", "AMYLASE" in the last 168 hours. No results for input(s): "AMMONIA" in the last 168 hours. CBC: Recent Labs  Lab 08/03/21 1228 08/04/21 0512 08/05/21 0543 08/07/21 0604  WBC 11.1* 8.7 10.6* 11.7*  NEUTROABS  --   --  8.4*  --   HGB 12.6* 11.9* 11.0* 11.4*  HCT 39.6 37.7* 35.0* 34.9*  MCV 93.8 93.8 95.6 92.8  PLT 425* 366 366 361   Cardiac Enzymes: No results for input(s): "CKTOTAL", "CKMB", "CKMBINDEX", "TROPONINI" in the last 168 hours. BNP: BNP (last 3 results) Recent Labs    05/21/21 2104 06/03/21 2335 06/14/21 0628  BNP 31.6 29.6 23.3    ProBNP (last 3 results) No results for input(s): "PROBNP" in the last 8760 hours.  CBG: No results for input(s): "GLUCAP" in the last 168 hours.     Signed:  Dia Crawford, MD [facility]

## 2021-08-07 NOTE — Discharge Summary (Deleted)
  The note originally documented on this encounter has been moved the the encounter in which it belongs.  

## 2021-08-07 NOTE — ED Provider Notes (Signed)
[facility] Emergency Department Provider Note MRN:  [id number]  Arrival date & time: 08/07/21     Chief Complaint   Shortness of breath History of Present Illness   Mario [name] is a 71 y.o. year-old male with a history of COPD, lung cancer presenting to the ED with chief complaint of shortness of breath.  Patient here for concern that his lung is filling back up with fluid.  He has a history of a recurrent malignant pleural effusion.  He was recently admitted to the hospital and a catheter was placed.  He was drained during his last stay in the hospital and was going to have it drained again in 1 week.  He feels like it is full again.  Having some shortness of breath.  Not significantly worse than his baseline, explains he has a history of COPD.  He is having some left-sided rib pain.  Was told he has a rib fracture.  He was discharged 2 days ago, has had some issues with ambulating, feels weak, has had a few instances where he almost fell.  Review of Systems  A thorough review of systems was obtained and all systems are negative except as noted in the HPI and PMH.   Patient's Health History    Past Medical History:  Diagnosis Date   Anxiety    Bronchitis    COPD (chronic obstructive pulmonary disease) ([place])    Depression    History of radiation therapy    Left lung- 10/05/20-10/15/20- Dr. Gery Pray   Hypertension    lung ca 09/2020   Mental disorder    MI (myocardial infarction) ([place])    ????   OSA (obstructive sleep apnea)    Suicide attempt ([place])    Tension pneumothorax 06/27/2016   Uveitis     Past Surgical History:  Procedure Laterality Date   BIOPSY  07/03/2021   Procedure: BIOPSY;  Surgeon: Otis Brace, MD;  Location: [facility];  Service: Gastroenterology;;   BRONCHIAL BIOPSY  07/30/2020   Procedure: BRONCHIAL BIOPSIES;  Surgeon: Garner Nash, DO;  Location: [facility];  Service: Pulmonary;;   BRONCHIAL BRUSHINGS  07/30/2020    Procedure: BRONCHIAL BRUSHINGS;  Surgeon: Garner Nash, DO;  Location: [facility];  Service: Pulmonary;;   BRONCHIAL NEEDLE ASPIRATION BIOPSY  07/30/2020   Procedure: BRONCHIAL NEEDLE ASPIRATION BIOPSIES;  Surgeon: Garner Nash, DO;  Location: [place];  Service: Pulmonary;;   BRONCHIAL WASHINGS  07/30/2020   Procedure: BRONCHIAL WASHINGS;  Surgeon: Garner Nash, DO;  Location: [place];  Service: Pulmonary;;   CHEST TUBE INSERTION Left 06/27/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     ESOPHAGOGASTRODUODENOSCOPY N/A 07/03/2021   Procedure: ESOPHAGOGASTRODUODENOSCOPY (EGD);  Surgeon: Otis Brace, MD;  Location: [name];  Service: Gastroenterology;  Laterality: N/A;   IR PERC PLEURAL DRAIN W/INDWELL CATH W/IMG GUIDE  08/04/2021   SKIN CANCER EXCISION     VIDEO BRONCHOSCOPY WITH ENDOBRONCHIAL NAVIGATION Left 07/30/2020   Procedure: VIDEO BRONCHOSCOPY WITH ENDOBRONCHIAL NAVIGATION;  Surgeon: Garner Nash, DO;  Location: [place];  Service: Pulmonary;  Laterality: Left;    Family History  Problem Relation Age of Onset   Dementia Father     Social History   Socioeconomic History   Marital status: Single    Spouse name: Not on file   Number of children: Not on file   Years of education: Not on file   Highest education level: Not on file  Occupational History   Occupation: retired  Tobacco Use   Smoking status: Former    Packs/day: 1.00    Years: 35.00    Total pack years: 35.00    Types: Cigarettes    Quit date: 05/2016    Years since quitting: 5.1   Smokeless tobacco: Never  Vaping Use   Vaping Use: Never used  Substance and Sexual Activity   Alcohol use: No    Alcohol/week: 0.0 standard drinks of alcohol    Comment: denies use of any drugs or alcohol   Drug use: No   Sexual activity: Not on file  Other Topics Concern   Not on file  Social History Narrative   Not on file   Social Determinants of Health   Financial Resource Strain: Low Risk  (09/23/2020)    Overall Financial Resource Strain (CARDIA)    Difficulty of Paying Living Expenses: Not hard at all  Food Insecurity: Not on file  Transportation Needs: No Transportation Needs (09/23/2020)   PRAPARE - Hydrologist (Medical): No    Lack of Transportation (Non-Medical): No  Physical Activity: Not on file  Stress: No Stress Concern Present (09/23/2020)   [place]    Feeling of Stress : Not at all  Social Connections: Not on file  Intimate Partner Violence: Not on file     Physical Exam   Vitals:   08/07/21 0500 08/07/21 0600  BP: (!) 151/80 (!) 144/86  Pulse: (!) 104 (!) 102  Resp: 19 19  Temp:    SpO2: 92% 93%    CONSTITUTIONAL: Chronically ill-appearing, NAD NEURO/PSYCH:  Alert and oriented x 3, no focal deficits EYES:  eyes equal and reactive ENT/NECK:  no LAD, no JVD CARDIO: Regular rate, well-perfused, normal S1 and S2 PULM:  CTAB no wheezing or rhonchi GI/GU:  non-distended, non-tender MSK/SPINE:  No gross deformities, no edema SKIN:  no rash, atraumatic   *Additional and/or pertinent findings included in MDM below  Diagnostic and Interventional Summary    EKG Interpretation  Date/Time:  Saturday August 07 2021 05:34:35 EDT Ventricular Rate:  100 PR Interval:  201 QRS Duration: 118 QT Interval:  347 QTC Calculation: 448 R Axis:   19 Text Interpretation: Sinus tachycardia Nonspecific intraventricular conduction delay Confirmed by Gerlene Fee [phone]) on 08/07/2021 6:59:34 AM       Labs Reviewed  CBC - Abnormal; Notable for the following components:      Result Value   WBC 11.7 (*)    RBC 3.76 (*)    Hemoglobin 11.4 (*)    HCT 34.9 (*)    All other components within normal limits  COMPREHENSIVE METABOLIC PANEL - Abnormal; Notable for the following components:   Sodium 134 (*)    Glucose, Bld 105 (*)    All other components within normal limits  TROPONIN I  (HIGH SENSITIVITY)    DG Chest Port 1 View  Final Result      Medications  cefTRIAXone (ROCEPHIN) 2 g in sodium chloride 0.9 % 100 mL IVPB (has no administration in time range)  azithromycin (ZITHROMAX) 500 mg in sodium chloride 0.9 % 250 mL IVPB (has no administration in time range)  sodium chloride 0.9 % bolus 500 mL (has no administration in time range)  HYDROmorphone (DILAUDID) injection 1 mg (has no administration in time range)     Procedures  /  Critical Care Procedures  ED Course and Medical Decision Making  Initial Impression and Ddx Lung cancer,  recurrent pleural effusion, here with some mild shortness of breath, rib pain.  Overall favoring rib related pain, has a known rib fracture.  Could also be some discomfort from the new catheter, could also be reaccumulation of pleural fluid.  Overall low concern for ACS or PE or pneumothorax.  Awaiting labs, chest x-ray.  Past medical/surgical history that increases complexity of ED encounter: Lung cancer  Interpretation of Diagnostics I personally reviewed the EKG and my interpretation is as follows: Sinus rhythm, no significant change from prior  Labs overall reassuring with no significant blood count or electrolyte disturbance.  Chest x-ray revealing improvement of opacity on the left but new opacification on the right.  Patient Reassessment and Ultimate Disposition/Management     Patient endorsing some worsening cough and continued rib pain from the rib fracture.  On repeat assessment SPO2 is 90% on room air while at rest, blood pressure is 90 systolic, still in pain.  Feels unsafe at home.  Potentially has developed pneumonia from inadequate breaths related to recent rib fracture.  Will request hospitalist admission.  Patient management required discussion with the following services or consulting groups:  Hospitalist Service  Complexity of Problems Addressed Acute illness or injury that poses threat of life of bodily  function  Additional Data Reviewed and Analyzed Further history obtained from: Recent discharge summary  Additional Factors Impacting ED Encounter Risk Consideration of hospitalization  Barth Kirks. Sedonia Small, MD [place] [email or web address]  Final Clinical Impressions(s) / ED Diagnoses     ICD-10-CM   1. Pneumonia of right lung due to infectious organism, unspecified part of lung  J18.9       ED Discharge Orders     None        Discharge Instructions Discussed with and Provided to Patient:   Discharge Instructions   None      Maudie Flakes, MD 08/07/21 [phone]

## 2021-08-07 NOTE — ED Provider Notes (Signed)
7:44 AM Care assumed from Dr. Sedonia Small, at time of transfer care, patient awaiting for callback from admitting team for admission for developing pneumonia after recent admission for pleural effusion and rib fracture.  Previous team already ordered antibiotics and suspect this is a posttraumatic pneumonia developing.  His oxygen saturations are hovering around 90% on his home 3 L.  He will need admission.  Medicine team called and they will admit.  Clinical Impression: 1. Pneumonia of right lung due to infectious organism, unspecified part of lung     Disposition: Admit  This note was prepared with assistance of Dragon voice recognition software. Occasional wrong-word or sound-a-like substitutions may have occurred due to the inherent limitations of voice recognition software.       Cloee Dunwoody, Gwenyth Allegra, MD 08/07/21 [phone]

## 2021-08-08 LAB — CBC WITH DIFFERENTIAL/PLATELET
Abs Immature Granulocytes: 0.03 10*3/uL (ref 0.00–0.07)
Basophils Absolute: 0 10*3/uL (ref 0.0–0.1)
Basophils Relative: 0 %
Eosinophils Absolute: 0 10*3/uL (ref 0.0–0.5)
Eosinophils Relative: 0 %
HCT: 31.1 % — ABNORMAL LOW (ref 39.0–52.0)
Hemoglobin: 9.9 g/dL — ABNORMAL LOW (ref 13.0–17.0)
Immature Granulocytes: 0 %
Lymphocytes Relative: 7 %
Lymphs Abs: 0.8 10*3/uL (ref 0.7–4.0)
MCH: 29.6 pg (ref 26.0–34.0)
MCHC: 31.8 g/dL (ref 30.0–36.0)
MCV: 93.1 fL (ref 80.0–100.0)
Monocytes Absolute: 1.1 10*3/uL — ABNORMAL HIGH (ref 0.1–1.0)
Monocytes Relative: 9 %
Neutro Abs: 9.8 10*3/uL — ABNORMAL HIGH (ref 1.7–7.7)
Neutrophils Relative %: 84 %
Platelets: 359 10*3/uL (ref 150–400)
RBC: 3.34 MIL/uL — ABNORMAL LOW (ref 4.22–5.81)
RDW: 14.3 % (ref 11.5–15.5)
WBC: 11.7 10*3/uL — ABNORMAL HIGH (ref 4.0–10.5)
nRBC: 0 % (ref 0.0–0.2)

## 2021-08-08 LAB — COMPREHENSIVE METABOLIC PANEL
ALT: 11 U/L (ref 0–44)
AST: 12 U/L — ABNORMAL LOW (ref 15–41)
Albumin: 2.8 g/dL — ABNORMAL LOW (ref 3.5–5.0)
Alkaline Phosphatase: 72 U/L (ref 38–126)
Anion gap: 6 (ref 5–15)
BUN: 13 mg/dL (ref 8–23)
CO2: 28 mmol/L (ref 22–32)
Calcium: 8.6 mg/dL — ABNORMAL LOW (ref 8.9–10.3)
Chloride: 104 mmol/L (ref 98–111)
Creatinine, Ser: 0.73 mg/dL (ref 0.61–1.24)
GFR, Estimated: >60 mL/min (ref >60)
Glucose, Bld: 149 mg/dL — ABNORMAL HIGH (ref 70–99)
Potassium: 4.6 mmol/L (ref 3.5–5.1)
Sodium: 138 mmol/L (ref 135–145)
Total Bilirubin: 0.2 mg/dL — ABNORMAL LOW (ref 0.3–1.2)
Total Protein: 6 g/dL — ABNORMAL LOW (ref 6.5–8.1)

## 2021-08-08 LAB — PROCALCITONIN: Procalcitonin: <0.1 ng/mL

## 2021-08-08 LAB — LACTIC ACID, PLASMA
Lactic Acid, Venous: 1.3 mmol/L (ref 0.5–1.9)
Lactic Acid, Venous: 1.1 mmol/L (ref 0.5–1.9)

## 2021-08-08 LAB — PHOSPHORUS: Phosphorus: 3.4 mg/dL (ref 2.5–4.6)

## 2021-08-08 LAB — MAGNESIUM: Magnesium: 2.2 mg/dL (ref 1.7–2.4)

## 2021-08-08 MED ORDER — BUSPIRONE HCL 5 MG PO TABS
15.0000 mg | ORAL_TABLET | Freq: Every day | ORAL | Status: DC
  Administered 2021-08-08 – 2021-08-10 (×3): 15 mg via ORAL
  Filled 2021-08-08 (×3): qty 1

## 2021-08-08 MED ORDER — LURASIDONE HCL 40 MG PO TABS
40.0000 mg | ORAL_TABLET | Freq: Every day | ORAL | Status: DC
  Administered 2021-08-08 – 2021-08-10 (×3): 40 mg via ORAL
  Filled 2021-08-08 (×3): qty 1

## 2021-08-08 MED ORDER — BUSPIRONE HCL 5 MG PO TABS: 15.0000 mg | ORAL_TABLET | Freq: Every day | ORAL | Status: DC

## 2021-08-08 MED ORDER — LURASIDONE HCL 40 MG PO TABS
40.0000 mg | ORAL_TABLET | Freq: Every day | ORAL | Status: DC
  Filled 2021-08-08 (×2): qty 1

## 2021-08-08 MED ORDER — LIDOCAINE 5 % EX PTCH
1.0000 | MEDICATED_PATCH | CUTANEOUS | Status: DC
  Administered 2021-08-08 – 2021-08-10 (×3): 1 via TRANSDERMAL
  Filled 2021-08-08 (×3): qty 1

## 2021-08-08 NOTE — Plan of Care (Signed)

## 2021-08-08 NOTE — Progress Notes (Signed)
PROGRESS NOTE    Mario [name]  HTD:[id number] DOB: 12-07-1950 DOA: 08/07/2021 PCP: [facility]     Brief Narrative:  Mario [name] is a  71 y.o. WM PMHx Morbid obesity, HTN, DM Type II, CKD, COPD, OSA and NSCLC (follows with Dr. Mckinley Jewel).  Chronic respiratory failure with hypoxia, 3L O2 at home.  Hospitalized in May with enlarging pleural effusion.  He underwent thoracentesis and cytology showed metastatic carcinoma.  He was d/c'd to SNF and recently released from SNF to live with a "roommate".  Patient just discharged on 08/05/2021 for recurrent LEFT side malignant pleural effusion s/p Pleurx cath placement.  Patient was discharged with home RN, home PT, home OT.       Patient here for concern that his lung is filling back up with fluid.  He has a history of a recurrent malignant pleural effusion.  He was recently admitted to the hospital and a catheter was placed.  He was drained during his last stay in the hospital and was going to have it drained again in 1 week.  He feels like it is full again.  Having some shortness of breath.  Not significantly worse than his baseline, explains he has a history of COPD.  He is having some left-sided rib pain.  Was told he has a rib fracture.  He was discharged 2 days ago, has had some issues with ambulating, feels weak, has had a few instances where he almost fell.   Subjective: 6/11 afebrile overnight A/O x4, pain much better controlled.   Assessment & Plan: Covid vaccination;   Principal Problem:   HCAP (healthcare-associated pneumonia) Active Problems:   Acute on chronic respiratory failure (HCC)   Obesity, unspecified   HLA B27 (HLA B27 positive)   OSA (obstructive sleep apnea)   Non-small cell cancer of left lung (HCC)   Obesity, Class III, BMI 40-49.9 (morbid obesity) ([name])   Rib pain   Metastatic carcinoma (HCC)   Non-small cell lung cancer, left (HCC)   Chronic respiratory failure with hypoxia (HCC)  Recurrent LEFT  pleural effusion - likely malignant -added [name] to treatment team-  saw 5/23: I had a lengthy discussion with the patient today about his condition and treatment options.  I offered him the option of palliative care and hospice referral versus consideration of systemic treatment based on the molecular studies and PD-L1 expression. The patient is still interested in some form of treatment.  I will wait for the molecular studies before making final decision regarding his treatment. -hold Eliquis until decision made -6/ 7 s/p placement LEFT Pleurx cath by IR  -On discharge (hold) Methotrexate given his recurrent pleural effusion, places him at risk for Methotrexate toxicity.  PCP/oncologist to decide when/if to restart   Metastatic carcinoma/NSCLC LEFT lung - Per Dr. Earlie Server palliative care and hospice referral versus consideration of systemic treatment based on the molecular studies and PD-L1 expression. -Since patient not started on any treatment would appear he is not a good candidate for treatment - Follow-up with Dr. Earlie Server Oncology.  To discuss inpatient hospice vs residential hospice vs systemic treatment -Prior to discharge discussed at length patient's wishes was very confused as to if he should proceed with hospice.  Counseled that he could not just stay in the hospital long-term (very apprehensive about returning home). -6/10 attempted to contact Dr. Lorna Few oncology through chat, however he is unavailable.  We will attempt to contact again in the A.m. given that patient unable to  remain out of the hospital for any length of time believe any further treatment is FUTILE CARE.  Believe patient is in his terminal phase of disease process. - 6/10 will proceed to have additional physician review case and if he agrees FUTILE CARE all aggressive care will be discontinued and patient will be offered hospice care. -6/10 patient has agreed to discuss residential hospice with palliative  care -6/11 discussed case with Dr. Alen Blew oncology unsure of what Dr. Earlie Server treatment plan is.  Verified Dr. Earlie Server returns in A.m. requested I contact him.   Pain control -   HCAP -6/10 reviewed PCXR do not believe patient has pneumonia.  Negative fever, negative leukocytosis (even after steroid dose).  The patient's admission patient more on his ongoing progressive NSCLC. - 6/10 we will continue antibiotic for now.    Latest Reference Range & Units 08/07/21 18:22 08/08/21 00:23 08/08/21 05:45  Lactic Acid, Venous 0.5 - 1.9 mmol/L 1.2 1.3 1.1    Latest Reference Range & Units 08/07/21 18:22 08/08/21 07:23  Procalcitonin ng/mL <0.10 <0.10  - DuoNeb QID - Flutter valve - Incentive spirometry -Solu-Medrol 60 mg BID -HCAP RULED OUT.  DC antibiotics   Acute on Chronic  respiratory failure with Hypoxia  -continue 3L O2   OSA - CPAP per respiratory   Nondisplaced left anterior seventh rib fracture. -resume pain meds   Obesity BMI 41.73 kg/m     Goals of care - 6/7 palliative consult: [name] to evaluate when patient follows up with Dr. Earlie Server for changing [facility] to DNR at a minimum.  Patient will require HCPOA sooner rather than later -6/9 Palliative Care consult: Spoke with Dr. Rowe Pavy Palliative Care who will see patient again, just discharged 48 hours ago for the exact same reason.  SOB/rib pain.  Likelihood of patient making any meaningful recovery HIGHLY UNLIKELY as evidence that he was only able to stay out of the hospital for 48 hours.  Highly recommend residential hospice.   -6/11 consult TOC: SNF placement -6/11 PT/OT consult:Patient with NSCLC, not on treatment with deteriorating health unable to care for himself secondary to increasing weakness (not safe to discharge home), evaluate for CIR vs SNF    Mobility Assessment (last 72 hours)     Mobility Assessment     Row Name 08/08/21 0829 08/07/21 2230 08/07/21 1721       Does patient have an order for  bedrest or is patient medically unstable No - Continue assessment No - Continue assessment No - Continue assessment     What is the highest level of mobility based on the progressive mobility assessment? Level 4 (Walks with assist in room) - Balance while marching in place and cannot step forward and back - Complete Level 4 (Walks with assist in room) - Balance while marching in place and cannot step forward and back - Complete Level 5 (Walks with assist in room/hall) - Balance while stepping forward/back and can walk in room with assist - Complete                Interdisciplinary Goals of Care Family Meeting   Date carried out: 08/08/2021  Location of the meeting:   Member's involved:   Durable Power of Tour manager:     Discussion: We discussed goals of care for [employer] .    Code status:   Disposition:   Time spent for the meeting:     Allie Bossier, MD  08/08/2021,  11:03 AM         DVT prophylaxis: Apixaban Code Status: Full Family Communication:  Status is: Inpatient    Dispo: The patient is from: Home              Anticipated d/c is to: SNF              Anticipated d/c date is: > 3 days              Patient currently is not medically stable to d/c.       Consultants:  Palliative care   Procedures/Significant Events:  6/10 PCXR Decrease in volume of loculated left pleural effusion status post chest tube placement. 2. Persistent atelectasis and airspace consolidation involving the left lower lobe. 3. New airspace consolidation within the right lower lung.   I have personally reviewed and interpreted all radiology studies and my findings are as above.  VENTILATOR SETTINGS:    Cultures 6/10 influenza A/B negative 6/10 SARS coronavirus negative 6/10 respiratory virus panel pending 6/10 sputum pending 6/10 blood pending  Antimicrobials:    Devices    LINES / TUBES:      Continuous  Infusions:  azithromycin 500 mg (08/08/21 1004)   cefTRIAXone (ROCEPHIN)  IV 2 g (08/08/21 0824)   dextrose 5 % and 0.9% NaCl 75 mL/hr at 08/07/21 2221     Objective: Vitals:   08/07/21 1636 08/07/21 1721 08/07/21 2351 08/08/21 0551  BP:  135/67 133/73 (!) 145/75  Pulse:  95 95 93  Resp:  _0 Temp: 98.4 F (36.9 C) 98.2 F (36.8 C) 97.9 F (36.6 C)   TempSrc: Oral Oral Oral   SpO2:  96% 94% 92%  Weight:      Height:        Intake/Output Summary (Last 24 hours) at 08/08/2021 1103 Last data filed at 08/08/2021 1012 Gross per 24 hour  Intake 1428.53 ml  Output 2350 ml  Net -921.47 ml   Filed Weights   08/07/21 0444  Weight: (!) 147.4 kg    Examination:  General: A/O x4, No acute respiratory distress Eyes: negative scleral hemorrhage, negative anisocoria, negative icterus ENT: Negative Runny nose, negative gingival bleeding, Neck:  Negative scars, masses, torticollis, lymphadenopathy, JVD Lungs: absent breath sounds left lung field, positive clear to auscultation right lung field  without wheezes or crackles Cardiovascular: Regular rate and rhythm without murmur gallop or rub normal S1 and S2 Abdomen: Morbidly obese, abdominal pain, positive distention, positive soft, bowel sounds, no rebound, no ascites, no appreciable mass Extremities: No significant cyanosis, clubbing, or edema bilateral lower extremities Skin: Negative rashes, lesions, ulcers Psychiatric:  Negative depression, negative anxiety, negative fatigue, negative mania  Central nervous system:  Cranial nerves II through XII intact, tongue/uvula midline, all extremities muscle strength 5/5, sensation intact throughout, negative dysarthria, negative expressive aphasia, negative receptive aphasia.  .     Data Reviewed: Care during the described time interval was provided by me .  I have reviewed this patient's available data, including medical history, events of note, physical examination, and all test  results as part of my evaluation.  CBC: Recent Labs  Lab 08/04/21 0512 08/05/21 0543 08/07/21 0604 08/07/21 1036 08/08/21 0723  WBC 8.7 10.6* 11.7* 10.3 11.7*  NEUTROABS  --  8.4*  --   --  9.8*  HGB 11.9* 11.0* 11.4* 10.6* 9.9*  HCT 37.7* 35.0* 34.9* 33.3* 31.1*  MCV 93.8 95.6 92.8 94.6 93.1  PLT 366 366 361 343  102   Basic Metabolic Panel: Recent Labs  Lab 08/03/21 1228 08/03/21 1422 08/04/21 0512 08/05/21 0543 08/07/21 0604 08/07/21 1036 08/08/21 0723  NA 133*  --  136 136 134*  --  138  K 4.7  --  4.3 4.3 4.5  --  4.6  CL 101  --  102 101 100  --  104  CO2 22  --  _0 --  28  GLUCOSE 142*  --  134* 124* 105*  --  149*  BUN 9  --  _1 --  13  CREATININE 0.88  --  0.86 0.97 0.94 0.85 0.73  CALCIUM 8.9  --  9.2 8.8* 9.1  --  8.6*  MG  --  2.3  --  2.1  --   --  2.2  PHOS  --   --   --  4.1  --   --  3.4   GFR: Estimated Creatinine Clearance: 133.5 mL/min (by C-G formula based on SCr of 0.73 mg/dL). Liver Function Tests: Recent Labs  Lab 08/03/21 1422 08/05/21 0543 08/07/21 0604 08/08/21 0723  AST 33 14* 16 12*  ALT _2 ALKPHOS 118 94 93 72  BILITOT 1.0 0.4 0.5 0.2*  PROT 7.4 6.4* 6.7 6.0*  ALBUMIN 3.9 3.3* 3.5 2.8*   No results for input(s): "LIPASE", "AMYLASE" in the last 168 hours. No results for input(s): "AMMONIA" in the last 168 hours. Coagulation Profile: Recent Labs  Lab 08/04/21 0512  INR 1.0   Cardiac Enzymes: No results for input(s): "CKTOTAL", "CKMB", "CKMBINDEX", "TROPONINI" in the last 168 hours. BNP (last 3 results) No results for input(s): "PROBNP" in the last 8760 hours. HbA1C: No results for input(s): "HGBA1C" in the last 72 hours. CBG: No results for input(s): "GLUCAP" in the last 168 hours. Lipid Profile: No results for input(s): "CHOL", "HDL", "LDLCALC", "TRIG", "CHOLHDL", "LDLDIRECT" in the last 72 hours. Thyroid Function Tests: No results for input(s): "TSH", "T4TOTAL", "FREET4", "T3FREE", "THYROIDAB"  in the last 72 hours. Anemia Panel: No results for input(s): "VITAMINB12", "FOLATE", "FERRITIN", "TIBC", "IRON", "RETICCTPCT" in the last 72 hours. Sepsis Labs: Recent Labs  Lab 08/07/21 1822 08/08/21 0023 08/08/21 0545 08/08/21 0723  PROCALCITON <0.10  --   --  <0.10  LATICACIDVEN 1.2 1.3 1.1  --     Recent Results (from the past 240 hour(s))  Resp Panel by RT-PCR (Flu A&B, Covid) Anterior Nasal Swab     Status: None   Collection Time: 08/03/21  1:33 PM   Specimen: Anterior Nasal Swab  Result Value Ref Range Status   SARS Coronavirus 2 by RT PCR NEGATIVE NEGATIVE Final    Comment: (NOTE) SARS-CoV-2 target nucleic acids are NOT DETECTED.  The SARS-CoV-2 RNA is generally detectable in upper respiratory specimens during the acute phase of infection. The lowest concentration of SARS-CoV-2 viral copies this assay can detect is 138 copies/mL. A negative result does not preclude SARS-Cov-2 infection and should not be used as the sole basis for treatment or other patient management decisions. A negative result may occur with  improper specimen collection/handling, submission of specimen other than nasopharyngeal swab, presence of viral mutation(s) within the areas targeted by this assay, and inadequate number of viral copies(<138 copies/mL). A negative result must be combined with clinical observations, patient history, and epidemiological information. The expected result is Negative.  Fact Sheet for Patients:  EntrepreneurPulse.com.au  Fact Sheet for Healthcare Providers:  IncredibleEmployment.be  This test is no  t yet approved or cleared by the [place] and  has been authorized for detection and/or diagnosis of SARS-CoV-2 by FDA under an Emergency Use Authorization (EUA). This EUA will remain  in effect (meaning this test can be used) for the duration of the COVID-19 declaration under Section 564(b)(1) of the Act, 21 U.S.C.section  360bbb-3(b)(1), unless the authorization is terminated  or revoked sooner.       Influenza A by PCR NEGATIVE NEGATIVE Final   Influenza B by PCR NEGATIVE NEGATIVE Final    Comment: (NOTE) The Xpert Xpress SARS-CoV-2/FLU/RSV plus assay is intended as an aid in the diagnosis of influenza from Nasopharyngeal swab specimens and should not be used as a sole basis for treatment. Nasal washings and aspirates are unacceptable for Xpert Xpress SARS-CoV-2/FLU/RSV testing.  Fact Sheet for Patients: EntrepreneurPulse.com.au  Fact Sheet for Healthcare Providers: IncredibleEmployment.be  This test is not yet approved or cleared by the [place] FDA and has been authorized for detection and/or diagnosis of SARS-CoV-2 by FDA under an Emergency Use Authorization (EUA). This EUA will remain in effect (meaning this test can be used) for the duration of the COVID-19 declaration under Section 564(b)(1) of the Act, 21 U.S.C. section 360bbb-3(b)(1), unless the authorization is terminated or revoked.  Performed at [facility], [place] [address], [place], [place] [address]   Respiratory (~20 pathogens) panel by PCR     Status: None   Collection Time: 08/07/21 10:53 AM   Specimen: Respiratory  Result Value Ref Range Status   Adenovirus NOT DETECTED NOT DETECTED Final   Coronavirus 229E NOT DETECTED NOT DETECTED Final    Comment: (NOTE) The Coronavirus on the Respiratory Panel, DOES NOT test for the novel  Coronavirus (2019 nCoV)    Coronavirus HKU1 NOT DETECTED NOT DETECTED Final   Coronavirus NL63 NOT DETECTED NOT DETECTED Final   Coronavirus OC43 NOT DETECTED NOT DETECTED Final   Metapneumovirus NOT DETECTED NOT DETECTED Final   Rhinovirus / Enterovirus NOT DETECTED NOT DETECTED Final   Influenza A NOT DETECTED NOT DETECTED Final   Influenza B NOT DETECTED NOT DETECTED Final   Parainfluenza Virus 1 NOT DETECTED NOT DETECTED Final    Parainfluenza Virus 2 NOT DETECTED NOT DETECTED Final   Parainfluenza Virus 3 NOT DETECTED NOT DETECTED Final   Parainfluenza Virus 4 NOT DETECTED NOT DETECTED Final   Respiratory Syncytial Virus NOT DETECTED NOT DETECTED Final   Bordetella pertussis NOT DETECTED NOT DETECTED Final   Bordetella Parapertussis NOT DETECTED NOT DETECTED Final   Chlamydophila pneumoniae NOT DETECTED NOT DETECTED Final   Mycoplasma pneumoniae NOT DETECTED NOT DETECTED Final    Comment: Performed at [facility], [place]. [address] [place], [place] [address]  Resp Panel by RT-PCR (Flu A&B, Covid)     Status: None   Collection Time: 08/07/21 10:53 AM   Specimen: Nasal Swab  Result Value Ref Range Status   SARS Coronavirus 2 by RT PCR NEGATIVE NEGATIVE Final    Comment: (NOTE) SARS-CoV-2 target nucleic acids are NOT DETECTED.  The SARS-CoV-2 RNA is generally detectable in upper respiratory specimens during the acute phase of infection. The lowest concentration of SARS-CoV-2 viral copies this assay can detect is 138 copies/mL. A negative result does not preclude SARS-Cov-2 infection and should not be used as the sole basis for treatment or other patient management decisions. A negative result may occur with  improper specimen collection/handling, submission of specimen other than nasopharyngeal swab, presence of viral mutation(s)  within the areas targeted by this assay, and inadequate number of viral copies(<138 copies/mL). A negative result must be combined with clinical observations, patient history, and epidemiological information. The expected result is Negative.  Fact Sheet for Patients:  EntrepreneurPulse.com.au  Fact Sheet for Healthcare Providers:  IncredibleEmployment.be  This test is no t yet approved or cleared by the [place] FDA and  has been authorized for detection and/or diagnosis of SARS-CoV-2 by FDA under an Emergency Use Authorization (EUA).  This EUA will remain  in effect (meaning this test can be used) for the duration of the COVID-19 declaration under Section 564(b)(1) of the Act, 21 U.S.C.section 360bbb-3(b)(1), unless the authorization is terminated  or revoked sooner.       Influenza A by PCR NEGATIVE NEGATIVE Final   Influenza B by PCR NEGATIVE NEGATIVE Final    Comment: (NOTE) The Xpert Xpress SARS-CoV-2/FLU/RSV plus assay is intended as an aid in the diagnosis of influenza from Nasopharyngeal swab specimens and should not be used as a sole basis for treatment. Nasal washings and aspirates are unacceptable for Xpert Xpress SARS-CoV-2/FLU/RSV testing.  Fact Sheet for Patients: EntrepreneurPulse.com.au  Fact Sheet for Healthcare Providers: IncredibleEmployment.be  This test is not yet approved or cleared by the [place] FDA and has been authorized for detection and/or diagnosis of SARS-CoV-2 by FDA under an Emergency Use Authorization (EUA). This EUA will remain in effect (meaning this test can be used) for the duration of the COVID-19 declaration under Section 564(b)(1) of the Act, 21 U.S.C. section 360bbb-3(b)(1), unless the authorization is terminated or revoked.  Performed at [facility], [place] [address], [place], [place] [address]          Radiology Studies: [facility] Chest Port 1 View  Result Date: 08/07/2021 CLINICAL DATA:  Left-sided rib pain for 2 months. Painful with movement. Wrist sent rib fracture. EXAM: PORTABLE CHEST 1 VIEW COMPARISON:  CT angio chest 08/03/2021 FINDINGS: There is a left-sided chest tube in place with tip terminating along the left heart border. No pneumothorax identified. Loculated left pleural effusion appears decreased in volume from the previous exam. Large bulla within the left base is in the projection of the loculated left pleural effusion. Decreased aeration to the left lower lobe is unchanged compatible with  atelectasis and airspace consolidation. New airspace opacities within the right lower lung are identified. IMPRESSION: 1. Decrease in volume of loculated left pleural effusion status post chest tube placement. 2. Persistent atelectasis and airspace consolidation involving the left lower lobe. 3. New airspace consolidation within the right lower lung. Electronically Signed   By: Kerby Moors M.D.   On: 08/07/2021 06:54        Scheduled Meds:  apixaban  5 mg Oral BID   vitamin C  500 mg Oral BID   aspirin EC  81 mg Oral Daily   buPROPion  300 mg Oral Daily   clotrimazole   Topical BID   cycloSPORINE  1 drop Both Eyes BID   diltiazem  180 mg Oral Daily   DULoxetine  60 mg Oral BID   feeding supplement  237 mL Oral TID BM   fluticasone  2 spray Each Nare Daily   folic acid  1 mg Oral Daily   gabapentin  300 mg Oral BID   ipratropium-albuterol  3 mL Nebulization QID   methylPREDNISolone (SOLU-MEDROL) injection  60 mg Intravenous Q12H   mycophenolate  1,500 mg Oral BID   OXcarbazepine  600 mg Oral Daily  OXcarbazepine  900 mg Oral QHS   oxyCODONE  10 mg Oral Q12H   tamsulosin  0.4 mg Oral Daily   traZODone  75 mg Oral QHS   Continuous Infusions:  azithromycin 500 mg (08/08/21 1004)   cefTRIAXone (ROCEPHIN)  IV 2 g (08/08/21 0824)   dextrose 5 % and 0.9% NaCl 75 mL/hr at 08/07/21 2221     LOS: 1 day    Time spent:40 min    Haleem Hanner, Geraldo Docker, MD [facility]   If 7PM-7AM, please contact night-coverage 08/08/2021, 11:03 AM

## 2021-08-08 NOTE — Evaluation (Signed)
Occupational Therapy Evaluation Patient Details Name: Mario [name] MRN: [id number] DOB: 07/17/1950 Today's Date: 08/08/2021   History of Present Illness patient is a 71 year old male who was noted to be recently discharged on 6/8 with recurrent left side malignant pleural effusions.patient was admitted with recurrent left pleural effusion.  PMH: acute on chronic resp failure - COPD, CAP, sepsis and hx of [place] lung CA stage II s/p radiation, PE in 2023, OSA on CPAP, HTN, chronic back pain, hx of ankylosing spondylitis, anxiety, and depression, T2DM.   Clinical Impression   Patient is a 71 year old male who was admitted for above. Patient reported having increased difficulty and LOB at home with no other assistance available. Patient was noted to have decreased functional activity tolerance and increased pain impacting participation in ADLs.  Patient would continue to benefit from skilled OT services at this time while admitted and after d/c to address noted deficits in order to improve overall safety and independence in ADLs.       Recommendations for follow up therapy are one component of a multi-disciplinary discharge planning process, led by the attending physician.  Recommendations may be updated based on patient status, additional functional criteria and insurance authorization.   Follow Up Recommendations  Skilled nursing-short term rehab (<3 hours/day)    Assistance Recommended at Discharge Frequent or constant Supervision/Assistance  Patient can return home with the following A little help with walking and/or transfers;A little help with bathing/dressing/bathroom;Assistance with cooking/housework;Direct supervision/assist for financial management;Assist for transportation;Help with stairs or ramp for entrance;Direct supervision/assist for medications management    Functional Status Assessment  Patient has had a recent decline in their functional status and demonstrates the ability to  make significant improvements in function in a reasonable and predictable amount of time.  Equipment Recommendations  None recommended by OT    Recommendations for Other Services       Precautions / Restrictions Precautions Precaution Comments: monitor O2 Restrictions Weight Bearing Restrictions: No      Mobility Bed Mobility Overal bed mobility: Needs Assistance Bed Mobility: Supine to Sit     Supine to sit: Supervision     General bed mobility comments: incresed time    Transfers                          Balance Overall balance assessment: Mild deficits observed, not formally tested                                         ADL either performed or assessed with clinical judgement   ADL Overall ADL's : Needs assistance/impaired Eating/Feeding: Set up;Sitting   Grooming: Wash/dry face;Wash/dry hands;Set up;Sitting Grooming Details (indicate cue type and reason): EOB Upper Body Bathing: Set up;Sitting Upper Body Bathing Details (indicate cue type and reason): EOB Lower Body Bathing: Moderate assistance;Sit to/from stand;Sitting/lateral leans   Upper Body Dressing : Set up;Sitting   Lower Body Dressing: Moderate assistance;Sit to/from stand;Sitting/lateral leans Lower Body Dressing Details (indicate cue type and reason): patient unable to bring ankles to lap sititng EOB or in chair. Toilet Transfer: Minimal assistance;Rolling walker (2 wheels) Toilet Transfer Details (indicate cue type and reason): with cues to keep RW close from edge of bed to recliner Toileting- Clothing Manipulation and Hygiene: Sit to/from stand;Moderate assistance       Functional mobility during ADLs: Min guard;Rolling  walker (2 wheels)       Vision         Perception     Praxis      Pertinent Vitals/Pain Pain Assessment Pain Assessment: 0-10 Pain Location: L rib- 7, tailbone 6 Pain Descriptors / Indicators: Sharp, Aching Pain Intervention(s):  Monitored during session, Repositioned, Limited activity within patient's tolerance     Hand Dominance Right   Extremity/Trunk Assessment Upper Extremity Assessment Upper Extremity Assessment: Overall WFL for tasks assessed   Lower Extremity Assessment Lower Extremity Assessment: Defer to PT evaluation   Cervical / Trunk Assessment Cervical / Trunk Assessment: Normal   Communication Communication Communication: No difficulties   Cognition Arousal/Alertness: Awake/alert Behavior During Therapy: WFL for tasks assessed/performed Overall Cognitive Status: Within Functional Limits for tasks assessed                                       General Comments       Exercises     Shoulder Instructions      Home Living Family/patient expects to be discharged to:: Skilled nursing facility Living Arrangements: Other (Comment)                               Additional Comments: wears 3 L O2 at baseline. patient reported that roommate cannot help with things as he has his own health issues at this time.      Prior Functioning/Environment Prior Level of Function : Independent/Modified Independent             Mobility Comments: ambulates within his home without an AD; typically utilizes a w/c when at dr's appointments; otherwise does not leave his home ADLs Comments: independent with ADLs and IADLs; reports sits to shower, easily fatigues. aptient reporetd having freee rides to MD appointmetns through [facility]. patient reported usign [employer] for groceries. patietn reportd he ordered out most days for meals.        OT Problem List: Decreased activity tolerance;Impaired balance (sitting and/or standing);Decreased safety awareness;Decreased knowledge of precautions;Decreased knowledge of use of DME or AE      OT Treatment/Interventions: Self-care/ADL training;Therapeutic exercise;Neuromuscular education;Energy conservation;DME and/or AE  instruction;Therapeutic activities;Balance training;Patient/family education    OT Goals(Current goals can be found in the care plan section) Acute Rehab OT Goals Patient Stated Goal: to get cancer treatments addressed OT Goal Formulation: With patient Time For Goal Achievement: 08/22/21 Potential to Achieve Goals: Good  OT Frequency: Min 2X/week    Co-evaluation              AM-PAC OT "6 Clicks" Daily Activity     Outcome Measure Help from another person eating meals?: None Help from another person taking care of personal grooming?: A Little Help from another person toileting, which includes using toliet, bedpan, or urinal?: A Little Help from another person bathing (including washing, rinsing, drying)?: A Little Help from another person to put on and taking off regular upper body clothing?: A Little Help from another person to put on and taking off regular lower body clothing?: A Lot 6 Click Score: 18   End of Session Equipment Utilized During Treatment: Rolling walker (2 wheels) Nurse Communication: Mobility status  Activity Tolerance: Patient tolerated treatment well Patient left: in chair;with call bell/phone within reach  OT Visit Diagnosis: Unsteadiness on feet (R26.81);Other abnormalities of gait and mobility (R26.89);Muscle weakness (generalized) (  M62.81)                Time: 7517-0017 OT Time Calculation (min): 16 min Charges:  OT General Charges $OT Visit: 1 Visit OT Evaluation $OT Eval Moderate Complexity: 1 Mod  Jackelyn Poling OTR/L, MS Acute Rehabilitation Department Office# [phone] Pager# [phone]   Marcellina Millin 08/08/2021, 1:37 PM

## 2021-08-08 NOTE — Progress Notes (Signed)
Respiratory panel and flu tests all negative, will DC droplet precautions per protocol.

## 2021-08-08 NOTE — Progress Notes (Signed)
Daily Progress Note   Patient Name: Mario [name]       Date: 08/08/2021 DOB: [birth year]  Age: 71 y.o. MRN#: [id number] Attending Physician: Allie Bossier, MD Primary Care Physician: [facility] Admit Date: 08/07/2021  Reason for Consultation/Follow-up: Establishing goals of care, Non pain symptom management, and Pain control  Subjective: Patient is resting in bed.  I discussed with him about his CODE STATUS and goals of care, see below.  Length of Stay: 1  Current Medications: Scheduled Meds:   apixaban  5 mg Oral BID   vitamin C  500 mg Oral BID   aspirin EC  81 mg Oral Daily   buPROPion  300 mg Oral Daily   busPIRone  15 mg Oral Daily   busPIRone  15 mg Oral Daily   clotrimazole   Topical BID   cycloSPORINE  1 drop Both Eyes BID   diltiazem  180 mg Oral Daily   DULoxetine  60 mg Oral BID   feeding supplement  237 mL Oral TID BM   fluticasone  2 spray Each Nare Daily   folic acid  1 mg Oral Daily   gabapentin  300 mg Oral BID   ipratropium-albuterol  3 mL Nebulization QID   lurasidone  40 mg Oral QPC supper   lurasidone  40 mg Oral QPC supper   methylPREDNISolone (SOLU-MEDROL) injection  60 mg Intravenous Q12H   mycophenolate  1,500 mg Oral BID   OXcarbazepine  600 mg Oral Daily   OXcarbazepine  900 mg Oral QHS   oxyCODONE  10 mg Oral Q12H   tamsulosin  0.4 mg Oral Daily   traZODone  75 mg Oral QHS    Continuous Infusions:  azithromycin 500 mg (08/08/21 1004)   cefTRIAXone (ROCEPHIN)  IV 2 g (08/08/21 0824)   dextrose 5 % and 0.9% NaCl 75 mL/hr at 08/07/21 2221    PRN Meds: albuterol, clobetasol ointment, ketorolac, methocarbamol, morphine injection, nitroGLYCERIN  Physical Exam         Awake alert oriented Resting in bed next minutes breath  sounds left more than right Has generalized pain No acute distress  Vital Signs: BP (!) 145/75 (BP Location: Right Arm)   Pulse 93   Temp 97.9 F (36.6 C) (Oral)   Resp 18   Ht 6'  2" (1.88 m)   Wt (!) 147.4 kg   SpO2 92%   BMI 41.73 kg/m  SpO2: SpO2: 92 % O2 Device: O2 Device: Nasal Cannula O2 Flow Rate: O2 Flow Rate (L/min): 4 L/min  Intake/output summary:  Intake/Output Summary (Last 24 hours) at 08/08/2021 1228 Last data filed at 08/08/2021 1012 Gross per 24 hour  Intake 1428.53 ml  Output 2350 ml  Net -921.47 ml   LBM: Last BM Date : 08/06/21 Baseline Weight: Weight: (!) 147.4 kg Most recent weight: Weight: (!) 147.4 kg       Palliative Assessment/Data:      Patient Active Problem List   Diagnosis Date Noted   Metastatic carcinoma ([place]) 08/07/2021   Non-small cell lung cancer, left ([place]) 08/07/2021   Chronic respiratory failure with hypoxia ([place]) 08/07/2021   Rib pain 08/03/2021   Encounter for antineoplastic chemotherapy 07/20/2021   Leg paresthesia 06/17/2021   Sepsis ([place]) 06/17/2021   Obesity, Class III, BMI 40-49.9 (morbid obesity) ([place]) 06/17/2021   Pneumonia 06/17/2021   Acute respiratory failure with hypoxia ([place]) 06/04/2021   Acute on chronic respiratory failure with hypoxia ([place]) 06/04/2021   Hypertension    Allergic rhinitis    History of pulmonary embolism    Acute hypoxemic respiratory failure ([place]) 05/21/2021   Hyponatremia 05/20/2021   Acute pulmonary embolism without acute cor pulmonale ([place]) 02/03/2021   Mood disorder ([place]) 02/03/2021   Acute on chronic respiratory failure ([place]) 01/09/2021   Non-small cell cancer of left lung ([place]) 09/21/2020   Lung nodule 07/30/2020   Mass of left lung    Acute respiratory failure with hypoxemia ([place]) 07/26/2020   Constipation    HCAP (healthcare-associated pneumonia) 04/03/2020   COPD with acute exacerbation ([place]) 01/05/2020   Vertigo 12/26/2019   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   Stage 3a  chronic kidney disease ([place]) 12/26/2019   Abnormal CT of the chest 12/26/2019   COPD (chronic obstructive pulmonary disease) ([facility])    AKI (acute kidney injury) ([place])    Hyperglycemia 06/23/2019   OSA (obstructive sleep apnea) 06/23/2019   Suicidal ideation 06/23/2019   Major depressive disorder, recurrent episode ([place]) 10/12/2018   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   Cellulitis of both feet 07/16/2017   DM2 (diabetes mellitus, type 2) ([place]) 07/16/2017   HLA B27 (HLA B27 positive) 07/16/2017   Acute chest pain    Hyperkalemia    Spontaneous pneumothorax 06/27/2016   Hypoxia    Pneumothorax on left    Dyspnea 05/23/2014   COPD exacerbation ([place]) 05/23/2014   Malingering 01/21/2013   Depression 01/11/2013   Tobacco abuse 01/11/2013   Obesity, unspecified 01/11/2013   Chest pain 01/10/2013   Hypertension associated with diabetes ([place]) 01/10/2013    Palliative Care Assessment & Plan   Patient Profile:    Assessment: 71 year old with morbid obesity, hypertension diabetes, CKD, COPD, history of life limiting illness of non-small cell lung cancer, malignant pleural effusion, having recurrent hospitalizations, on home O2, underwent Pleurx cath placement in last hospitalization.  Recommendations/Plan: CODE STATUS and goals of care discussions undertaken again with the patient.  He does not interact much today.  Discussed frankly but compassionately about the nature of his serious illness.  Differences between full code versus DNR/DNI were discussed in detail.  Additionally, I gave him some more information about hospice philosophy of care. Patient asks for more time, states that he will continue discussions with his her daughter who lives out of town with  regards to the above.  Offered active listening and supportive presence and empathic communication along with motivational interviewing techniques. We will try lidocaine patch topical at the  site of left rib cage pain and monitor.   Code Status:    Code Status Orders  (From admission, onward)           Start     Ordered   08/07/21 0951  Full code  Continuous        08/07/21 0956           Code Status History     Date Active Date Inactive Code Status Order ID Comments User Context   08/03/2021 1851 08/06/2021 0348 Full Code [id number]  Geradine Girt, DO Inpatient   06/17/2021 0046 07/06/2021 1726 Full Code [id number]  Ileene Musa T, DO ED   06/04/2021 0139 06/11/2021 1609 Full Code [id number]  [place], Ethelda Chick, DO ED   05/21/2021 2320 05/25/2021 0549 Full Code [id number]  Rehman, [place], DO ED   05/20/2021 0150 05/21/2021 2025 Full Code [id number]  [place], [place], DO ED   02/04/2021 0105 02/04/2021 2154 Full Code [id number]  Lenore Cordia, MD ED   01/09/2021 0612 01/10/2021 2012 Full Code [id number]  Shela Leff, MD Inpatient   09/04/2020 0923 09/05/2020 1724 Full Code [id number]  Jonnie Finner, DO ED   07/26/2020 1449 08/05/2020 1913 Full Code [id number]  Jonnie Finner, DO Inpatient   04/04/2020 0033 04/07/2020 2141 Full Code [id number]  Rise Patience, MD ED   01/04/2020 1615 01/06/2020 1904 Full Code [id number]  Jonnie Finner, DO Inpatient   12/26/2019 1039 12/28/2019 1810 Full Code [id number]  Karmen Bongo, MD ED   07/12/2019 0557 07/15/2019 1904 Full Code [id number]  Merrily Pew, MD ED   07/03/2019 0016 07/03/2019 2011 Full Code [id number]  Reubin Milan, MD ED   07/03/2019 0016 07/03/2019 0016 Full Code [id number]  Reubin Milan, MD ED   06/23/2019 0425 06/25/2019 2122 Full Code [id number]  Bunnie Pion Z, DO ED   10/10/2018 2021 10/13/2018 1641 Full Code [id number]  Ward, Ozella Almond, PA-C ED   04/30/2018 1759 05/01/2018 1714 Full Code [id number]  Delia Heady, PA-C ED   02/20/2018 1759 02/21/2018 1452 Full Code [id number]  Maudie Flakes, MD ED   07/16/2017 0309 07/21/2017 1734 Full Code [id number]  Etta Quill, DO ED   09/17/2016 2107 09/18/2016 2055 Full Code [id number]   Lovenia Kim, MD ED   06/27/2016 1523 07/07/2016 1718 Full Code [id number]  Nani Skillern, PA-C ED   05/23/2014 2356 05/26/2014 1656 Full Code [id number]  Jani Gravel, MD Inpatient   01/21/2013 2202 01/23/2013 2202 Full Code [id number]  Martie Lee, PA-C ED   01/12/2013 0020 01/12/2013 0641 Full Code [id number]  Margarita Mail, PA-C ED   01/10/2013 0122 01/11/2013 2122 Full Code [id number]  Rise Patience, MD Inpatient       Prognosis:  Unable to determine  Discharge Planning: To Be Determined  Care plan was discussed with patient and Dr. Sherral Hammers.  Thank you for allowing the Palliative Medicine Team to assist in the care of this patient.   Time In: 10 Time Out: 10.40 Total Time 40 Prolonged Time Billed  no       Greater than 50%  of this time was spent counseling and coordinating care related to the above assessment and plan.  Loistine Chance, MD  Please contact Palliative Medicine Team phone at [phone] for questions and concerns.

## 2021-08-09 DIAGNOSIS — J189 Pneumonia, unspecified organism: Secondary | ICD-10-CM | POA: Diagnosis not present

## 2021-08-09 LAB — CBC WITH DIFFERENTIAL/PLATELET
Abs Immature Granulocytes: 0.08 10*3/uL — ABNORMAL HIGH (ref 0.00–0.07)
Basophils Absolute: 0 10*3/uL (ref 0.0–0.1)
Basophils Relative: 0 %
Eosinophils Absolute: 0 10*3/uL (ref 0.0–0.5)
Eosinophils Relative: 0 %
HCT: 32 % — ABNORMAL LOW (ref 39.0–52.0)
Hemoglobin: 9.9 g/dL — ABNORMAL LOW (ref 13.0–17.0)
Immature Granulocytes: 1 %
Lymphocytes Relative: 3 %
Lymphs Abs: 0.5 10*3/uL — ABNORMAL LOW (ref 0.7–4.0)
MCH: 29.4 pg (ref 26.0–34.0)
MCHC: 30.9 g/dL (ref 30.0–36.0)
MCV: 95 fL (ref 80.0–100.0)
Monocytes Absolute: 0.4 10*3/uL (ref 0.1–1.0)
Monocytes Relative: 3 %
Neutro Abs: 14.4 10*3/uL — ABNORMAL HIGH (ref 1.7–7.7)
Neutrophils Relative %: 93 %
Platelets: 410 10*3/uL — ABNORMAL HIGH (ref 150–400)
RBC: 3.37 MIL/uL — ABNORMAL LOW (ref 4.22–5.81)
RDW: 14.5 % (ref 11.5–15.5)
WBC: 15.4 10*3/uL — ABNORMAL HIGH (ref 4.0–10.5)
nRBC: 0 % (ref 0.0–0.2)

## 2021-08-09 LAB — MAGNESIUM: Magnesium: 2 mg/dL (ref 1.7–2.4)

## 2021-08-09 LAB — PROCALCITONIN: Procalcitonin: <0.1 ng/mL

## 2021-08-09 LAB — PHOSPHORUS: Phosphorus: 2.4 mg/dL — ABNORMAL LOW (ref 2.5–4.6)

## 2021-08-09 MED ORDER — IPRATROPIUM-ALBUTEROL 0.5-2.5 (3) MG/3ML IN SOLN
3.0000 mL | Freq: Three times a day (TID) | RESPIRATORY_TRACT | Status: DC
Start: 2021-08-09 — End: 2021-08-11
  Administered 2021-08-09 – 2021-08-10 (×5): 3 mL via RESPIRATORY_TRACT
  Filled 2021-08-09 (×5): qty 3

## 2021-08-09 MED ORDER — ADULT MULTIVITAMIN W/MINERALS CH
1.0000 | ORAL_TABLET | Freq: Every day | ORAL | Status: DC
  Administered 2021-08-09 – 2021-08-10 (×2): 1 via ORAL
  Filled 2021-08-09 (×2): qty 1

## 2021-08-09 MED ORDER — POLYETHYLENE GLYCOL 3350 17 G PO PACK
17.0000 g | PACK | Freq: Every day | ORAL | Status: DC
  Administered 2021-08-09 – 2021-08-10 (×2): 17 g via ORAL
  Filled 2021-08-09 (×2): qty 1

## 2021-08-09 NOTE — Progress Notes (Signed)
PROGRESS NOTE    Mario [name]  BMW:[id number] DOB: Dec 17, 1950 DOA: 08/07/2021 PCP: [facility]     Brief Narrative:  Mario [name] is a  71 y.o. WM PMHx Morbid obesity, HTN, DM Type II, CKD, COPD, OSA and NSCLC (follows with Dr. Mckinley Jewel).  Chronic respiratory failure with hypoxia, 3L O2 at home.  Hospitalized in May with enlarging pleural effusion.  He underwent thoracentesis and cytology showed metastatic carcinoma.  He was d/c'd to SNF and recently released from SNF to live with a "roommate".  Patient just discharged on 08/05/2021 for recurrent LEFT side malignant pleural effusion s/p Pleurx cath placement.  Patient was discharged with home RN, home PT, home OT.       Patient here for concern that his lung is filling back up with fluid.  He has a history of a recurrent malignant pleural effusion.  He was recently admitted to the hospital and a catheter was placed.  He was drained during his last stay in the hospital and was going to have it drained again in 1 week.  He feels like it is full again.  Having some shortness of breath.  Not significantly worse than his baseline, explains he has a history of COPD.  He is having some left-sided rib pain.  Was told he has a rib fracture.  He was discharged 2 days ago, has had some issues with ambulating, feels weak, has had a few instances where he almost fell.   Subjective: 6/12 afebrile overnight A/O x4 states feels he is breathing a little bit better, pain better controlled.    Assessment & Plan: Covid vaccination;   Principal Problem:   HCAP (healthcare-associated pneumonia) Active Problems:   Acute on chronic respiratory failure (HCC)   Obesity, unspecified   HLA B27 (HLA B27 positive)   OSA (obstructive sleep apnea)   Non-small cell cancer of left lung (HCC)   Obesity, Class III, BMI 40-49.9 (morbid obesity) ([place])   Rib pain   Metastatic carcinoma (HCC)   Non-small cell lung cancer, left (HCC)   Chronic respiratory  failure with hypoxia (HCC)  Recurrent LEFT pleural effusion - likely malignant -added [name] to treatment team-  saw 5/23: I had a lengthy discussion with the patient today about his condition and treatment options.  I offered him the option of palliative care and hospice referral versus consideration of systemic treatment based on the molecular studies and PD-L1 expression. The patient is still interested in some form of treatment.  I will wait for the molecular studies before making final decision regarding his treatment. -hold Eliquis until decision made -6/ 7 s/p placement LEFT Pleurx cath by IR  -On discharge (hold) Methotrexate given his recurrent pleural effusion, places him at risk for Methotrexate toxicity.  PCP/oncologist to decide when/if to restart   Metastatic carcinoma/NSCLC LEFT lung - Per Dr. Earlie Server palliative care and hospice referral versus consideration of systemic treatment based on the molecular studies and PD-L1 expression. -Since patient not started on any treatment would appear he is not a good candidate for treatment - Follow-up with Dr. Earlie Server Oncology.  To discuss inpatient hospice vs residential hospice vs systemic treatment -Prior to discharge discussed at length patient's wishes was very confused as to if he should proceed with hospice.  Counseled that he could not just stay in the hospital long-term (very apprehensive about returning home). -6/10 attempted to contact Dr. Lorna Few oncology through chat, however he is unavailable.  We will attempt to contact  again in the A.m. given that patient unable to remain out of the hospital for any length of time believe any further treatment is FUTILE CARE.  Believe patient is in his terminal phase of disease process. - 6/10 will proceed to have additional physician review case and if he agrees FUTILE CARE all aggressive care will be discontinued and patient will be offered hospice care. -6/10 patient has agreed to  discuss residential hospice with palliative care -6/11 discussed case with Dr. Alen Blew oncology unsure of what Dr. Earlie Server treatment plan is.  Verified Dr. Earlie Server returns in A.m. requested I contact him. -6/12 have informed patient that general chemo therapy most likely is off the table given his deteriorating condition, and lack of social support.  However Dr. Earlie Server oncology to see patient today await recommendations   Pain control -6/12 continue current pain medication   HCAP -6/10 reviewed PCXR do not believe patient has pneumonia.  Negative fever, negative leukocytosis (even after steroid dose).  The patient's admission patient more on his ongoing progressive NSCLC. - 6/10 we will continue antibiotic for now.    Latest Reference Range & Units 08/07/21 18:22 08/08/21 00:23 08/08/21 05:45  Lactic Acid, Venous 0.5 - 1.9 mmol/L 1.2 1.3 1.1    Latest Reference Range & Units 08/07/21 18:22 08/08/21 07:23  Procalcitonin ng/mL <0.10 <0.10  - DuoNeb QID - Flutter valve - Incentive spirometry -Solu-Medrol 60 mg BID -HCAP RULED OUT.  DC antibiotics   Acute on Chronic  respiratory failure with Hypoxia  -continue 3L O2 -6/12 titrate O2 to maintain SPO2> 2%   OSA - CPAP per respiratory   Nondisplaced left anterior seventh rib fracture. -resume pain meds   Morbid obesity BMI 41.73 kg/m -Given patient's deteriorating physical condition not a factor.    Goals of care - 6/7 palliative consult: [name] to evaluate when patient follows up with Dr. Earlie Server for changing CODE STATUS to DNR at a minimum.  Patient will require HCPOA sooner rather than later -6/9 Palliative Care consult: Spoke with Dr. Rowe Pavy Palliative Care who will see patient again, just discharged 48 hours ago for the exact same reason.  SOB/rib pain.  Likelihood of patient making any meaningful recovery HIGHLY UNLIKELY as evidence that he was only able to stay out of the hospital for 48 hours.  Highly recommend  residential hospice.   -6/11 consult TOC: SNF placement -6/11 PT/OT consult:Patient with NSCLC, not on treatment with deteriorating health unable to care for himself secondary to increasing weakness (not safe to discharge home), evaluate for CIR vs SNF    Mobility Assessment (last 72 hours)     Mobility Assessment     Row Name 08/08/21 2103 08/08/21 1334 08/08/21 0829 08/07/21 2230 08/07/21 1721   Does patient have an order for bedrest or is patient medically unstable No - Continue assessment -- No - Continue assessment No - Continue assessment No - Continue assessment   What is the highest level of mobility based on the progressive mobility assessment? Level 4 (Walks with assist in room) - Balance while marching in place and cannot step forward and back - Complete Level 4 (Walks with assist in room) - Balance while marching in place and cannot step forward and back - Complete Level 4 (Walks with assist in room) - Balance while marching in place and cannot step forward and back - Complete Level 4 (Walks with assist in room) - Balance while marching in place and cannot step forward and back - Complete Level  5 (Walks with assist in room/hall) - Balance while stepping forward/back and can walk in room with assist - Complete              Interdisciplinary Goals of Care Family Meeting   Date carried out: 08/09/2021  Location of the meeting:   Member's involved:   Durable Power of Tour manager:     Discussion: We discussed goals of care for [employer] .    Code status:   Disposition:   Time spent for the meeting:     Ketzaly Cardella J, MD  08/09/2021, 8:48 AM         DVT prophylaxis: Apixaban Code Status: Full Family Communication:  Status is: Inpatient    Dispo: The patient is from: Home              Anticipated d/c is to: SNF              Anticipated d/c date is: > 3 days              Patient currently is not medically stable to  d/c.       Consultants:  Palliative care Oncology   Procedures/Significant Events:  6/10 PCXR Decrease in volume of loculated left pleural effusion status post chest tube placement. 2. Persistent atelectasis and airspace consolidation involving the left lower lobe. 3. New airspace consolidation within the right lower lung.    I have personally reviewed and interpreted all radiology studies and my findings are as above.  VENTILATOR SETTINGS:    Cultures 6/10 influenza A/B negative 6/10 SARS coronavirus negative 6/10 respiratory virus panel pending 6/10 sputum pending 6/10 blood pending  Antimicrobials: Anti-infectives (From admission, onward)    Start     Ordered Stop   08/08/21 0900  azithromycin (ZITHROMAX) 500 mg in sodium chloride 0.9 % 250 mL IVPB  Status:  Discontinued        08/07/21 0956 08/08/21 2005   08/08/21 0800  cefTRIAXone (ROCEPHIN) 2 g in sodium chloride 0.9 % 100 mL IVPB  Status:  Discontinued        08/07/21 0956 08/08/21 2005   08/07/21 0715  cefTRIAXone (ROCEPHIN) 2 g in sodium chloride 0.9 % 100 mL IVPB        08/07/21 0705 08/07/21 0818   08/07/21 0715  azithromycin (ZITHROMAX) 500 mg in sodium chloride 0.9 % 250 mL IVPB  Status:  Discontinued        08/07/21 0705 08/07/21 0956         Devices    LINES / TUBES:      Continuous Infusions:  dextrose 5 % and 0.9% NaCl 75 mL/hr at 08/08/21 2334     Objective: Vitals:   08/08/21 1342 08/08/21 2011 08/08/21 2029 08/09/21 0338  BP: 125/60  101/63 131/69  Pulse: 90   78  Resp: '19  19 18  ' Temp: (!) 97.4 F (36.3 C)  98.2 F (36.8 C) 98.5 F (36.9 C)  TempSrc:   Oral Oral  SpO2: 95% 91% 92% 94%  Weight:      Height:        Intake/Output Summary (Last 24 hours) at 08/09/2021 0848 Last data filed at 08/09/2021 0600 Gross per 24 hour  Intake 2286.42 ml  Output 1300 ml  Net 986.42 ml    Filed Weights   08/07/21 0444  Weight: (!) 147.4 kg    Examination:  General: A/O  x4, No acute respiratory distress Eyes:  negative scleral hemorrhage, negative anisocoria, negative icterus ENT: Negative Runny nose, negative gingival bleeding, Neck:  Negative scars, masses, torticollis, lymphadenopathy, JVD Lungs: decreased breath sounds bilateral (improved),  without wheezes or crackles Cardiovascular: Regular rate and rhythm without murmur gallop or rub normal S1 and S2 Abdomen: Morbidly obese, abdominal pain, positive distention, positive soft, bowel sounds, no rebound, no ascites, no appreciable mass Extremities: No significant cyanosis, clubbing, or edema bilateral lower extremities Skin: Negative rashes, lesions, ulcers Psychiatric:  Negative depression, negative anxiety, negative fatigue, negative mania  Central nervous system:  Cranial nerves II through XII intact, tongue/uvula midline, all extremities muscle strength 5/5, sensation intact throughout, negative dysarthria, negative expressive aphasia, negative receptive aphasia.  .     Data Reviewed: Care during the described time interval was provided by me .  I have reviewed this patient's available data, including medical history, events of note, physical examination, and all test results as part of my evaluation.  CBC: Recent Labs  Lab 08/05/21 0543 08/07/21 0604 08/07/21 1036 08/08/21 0723 08/09/21 0328  WBC 10.6* 11.7* 10.3 11.7* 15.4*  NEUTROABS 8.4*  --   --  9.8* 14.4*  HGB 11.0* 11.4* 10.6* 9.9* 9.9*  HCT 35.0* 34.9* 33.3* 31.1* 32.0*  MCV 95.6 92.8 94.6 93.1 95.0  PLT 366 361 343 359 410*    Basic Metabolic Panel: Recent Labs  Lab 08/03/21 1228 08/03/21 1422 08/04/21 0512 08/05/21 0543 08/07/21 0604 08/07/21 1036 08/08/21 0723 08/09/21 0328  NA 133*  --  136 136 134*  --  138  --   K 4.7  --  4.3 4.3 4.5  --  4.6  --   CL 101  --  102 101 100  --  104  --   CO2 22  --  '24 28 27  ' --  28  --   GLUCOSE 142*  --  134* 124* 105*  --  149*  --   BUN 9  --  '11 13 15  ' --  13  --    CREATININE 0.88  --  0.86 0.97 0.94 0.85 0.73  --   CALCIUM 8.9  --  9.2 8.8* 9.1  --  8.6*  --   MG  --  2.3  --  2.1  --   --  2.2 2.0  PHOS  --   --   --  4.1  --   --  3.4 2.4*    GFR: Estimated Creatinine Clearance: 133.5 mL/min (by C-G formula based on SCr of 0.73 mg/dL). Liver Function Tests: Recent Labs  Lab 08/03/21 1422 08/05/21 0543 08/07/21 0604 08/08/21 0723  AST 33 14* 16 12*  ALT '18 13 14 11  ' ALKPHOS 118 94 93 72  BILITOT 1.0 0.4 0.5 0.2*  PROT 7.4 6.4* 6.7 6.0*  ALBUMIN 3.9 3.3* 3.5 2.8*    No results for input(s): "LIPASE", "AMYLASE" in the last 168 hours. No results for input(s): "AMMONIA" in the last 168 hours. Coagulation Profile: Recent Labs  Lab 08/04/21 0512  INR 1.0    Cardiac Enzymes: No results for input(s): "CKTOTAL", "CKMB", "CKMBINDEX", "TROPONINI" in the last 168 hours. BNP (last 3 results) No results for input(s): "PROBNP" in the last 8760 hours. HbA1C: No results for input(s): "HGBA1C" in the last 72 hours. CBG: No results for input(s): "GLUCAP" in the last 168 hours. Lipid Profile: No results for input(s): "CHOL", "HDL", "LDLCALC", "TRIG", "CHOLHDL", "LDLDIRECT" in the last 72 hours. Thyroid Function Tests: No results for input(s): "TSH", "T4TOTAL", "  FREET4", "T3FREE", "THYROIDAB" in the last 72 hours. Anemia Panel: No results for input(s): "VITAMINB12", "FOLATE", "FERRITIN", "TIBC", "IRON", "RETICCTPCT" in the last 72 hours. Sepsis Labs: Recent Labs  Lab 08/07/21 1822 08/08/21 0023 08/08/21 0545 08/08/21 0723 08/09/21 0328  PROCALCITON <0.10  --   --  <0.10 <0.10  LATICACIDVEN 1.2 1.3 1.1  --   --      Recent Results (from the past 240 hour(s))  Resp Panel by RT-PCR (Flu A&B, Covid) Anterior Nasal Swab     Status: None   Collection Time: 08/03/21  1:33 PM   Specimen: Anterior Nasal Swab  Result Value Ref Range Status   SARS Coronavirus 2 by RT PCR NEGATIVE NEGATIVE Final    Comment: (NOTE) SARS-CoV-2 target nucleic  acids are NOT DETECTED.  The SARS-CoV-2 RNA is generally detectable in upper respiratory specimens during the acute phase of infection. The lowest concentration of SARS-CoV-2 viral copies this assay can detect is 138 copies/mL. A negative result does not preclude SARS-Cov-2 infection and should not be used as the sole basis for treatment or other patient management decisions. A negative result may occur with  improper specimen collection/handling, submission of specimen other than nasopharyngeal swab, presence of viral mutation(s) within the areas targeted by this assay, and inadequate number of viral copies(<138 copies/mL). A negative result must be combined with clinical observations, patient history, and epidemiological information. The expected result is Negative.  Fact Sheet for Patients:  EntrepreneurPulse.com.au  Fact Sheet for Healthcare Providers:  IncredibleEmployment.be  This test is no t yet approved or cleared by the [place] FDA and  has been authorized for detection and/or diagnosis of SARS-CoV-2 by FDA under an Emergency Use Authorization (EUA). This EUA will remain  in effect (meaning this test can be used) for the duration of the COVID-19 declaration under Section 564(b)(1) of the Act, 21 U.S.C.section 360bbb-3(b)(1), unless the authorization is terminated  or revoked sooner.       Influenza A by PCR NEGATIVE NEGATIVE Final   Influenza B by PCR NEGATIVE NEGATIVE Final    Comment: (NOTE) The Xpert Xpress SARS-CoV-2/FLU/RSV plus assay is intended as an aid in the diagnosis of influenza from Nasopharyngeal swab specimens and should not be used as a sole basis for treatment. Nasal washings and aspirates are unacceptable for Xpert Xpress SARS-CoV-2/FLU/RSV testing.  Fact Sheet for Patients: EntrepreneurPulse.com.au  Fact Sheet for Healthcare Providers: IncredibleEmployment.be  This  test is not yet approved or cleared by the [place] FDA and has been authorized for detection and/or diagnosis of SARS-CoV-2 by FDA under an Emergency Use Authorization (EUA). This EUA will remain in effect (meaning this test can be used) for the duration of the COVID-19 declaration under Section 564(b)(1) of the Act, 21 U.S.C. section 360bbb-3(b)(1), unless the authorization is terminated or revoked.  Performed at [facility], [place] [address], [place], [place] [address]   Culture, blood (Routine X 2) w Reflex to ID Panel     Status: None (Preliminary result)   Collection Time: 08/07/21 10:30 AM   Specimen: BLOOD  Result Value Ref Range Status   Specimen Description   Final    BLOOD LEFT ANTECUBITAL Performed at [place] [address] [place],  [address]    Special Requests   Final    BOTTLES DRAWN AEROBIC AND ANAEROBIC Blood Culture results may not be optimal due to an inadequate volume of blood received in culture bottles Performed at [facility], [address] [place], [place] [address]    Culture   Final    NO GROWTH < 24 HOURS Performed at [place] [place] [address] [place], [place] [address]    Report Status PENDING  Incomplete  Respiratory (~20 pathogens) panel by PCR     Status: None   Collection Time: 08/07/21 10:53 AM   Specimen: Respiratory  Result Value Ref Range Status   Adenovirus NOT DETECTED NOT DETECTED Final   Coronavirus 229E NOT DETECTED NOT DETECTED Final    Comment: (NOTE) The Coronavirus on the Respiratory Panel, DOES NOT test for the novel  Coronavirus (2019 nCoV)    Coronavirus HKU1 NOT DETECTED NOT DETECTED Final   Coronavirus NL63 NOT DETECTED NOT DETECTED Final   Coronavirus OC43 NOT DETECTED NOT DETECTED Final   Metapneumovirus NOT DETECTED NOT DETECTED Final   Rhinovirus / Enterovirus NOT DETECTED NOT DETECTED Final   Influenza A NOT DETECTED NOT DETECTED  Final   Influenza B NOT DETECTED NOT DETECTED Final   Parainfluenza Virus 1 NOT DETECTED NOT DETECTED Final   Parainfluenza Virus 2 NOT DETECTED NOT DETECTED Final   Parainfluenza Virus 3 NOT DETECTED NOT DETECTED Final   Parainfluenza Virus 4 NOT DETECTED NOT DETECTED Final   Respiratory Syncytial Virus NOT DETECTED NOT DETECTED Final   Bordetella pertussis NOT DETECTED NOT DETECTED Final   Bordetella Parapertussis NOT DETECTED NOT DETECTED Final   Chlamydophila pneumoniae NOT DETECTED NOT DETECTED Final   Mycoplasma pneumoniae NOT DETECTED NOT DETECTED Final    Comment: Performed at [facility], [place]. [address] [place], [place] [address]  Resp Panel by RT-PCR (Flu A&B, Covid)     Status: None   Collection Time: 08/07/21 10:53 AM   Specimen: Nasal Swab  Result Value Ref Range Status   SARS Coronavirus 2 by RT PCR NEGATIVE NEGATIVE Final    Comment: (NOTE) SARS-CoV-2 target nucleic acids are NOT DETECTED.  The SARS-CoV-2 RNA is generally detectable in upper respiratory specimens during the acute phase of infection. The lowest concentration of SARS-CoV-2 viral copies this assay can detect is 138 copies/mL. A negative result does not preclude SARS-Cov-2 infection and should not be used as the sole basis for treatment or other patient management decisions. A negative result may occur with  improper specimen collection/handling, submission of specimen other than nasopharyngeal swab, presence of viral mutation(s) within the areas targeted by this assay, and inadequate number of viral copies(<138 copies/mL). A negative result must be combined with clinical observations, patient history, and epidemiological information. The expected result is Negative.  Fact Sheet for Patients:  EntrepreneurPulse.com.au  Fact Sheet for Healthcare Providers:  IncredibleEmployment.be  This test is no t yet approved or cleared by the [place] FDA and  has  been authorized for detection and/or diagnosis of SARS-CoV-2 by FDA under an Emergency Use Authorization (EUA). This EUA will remain  in effect (meaning this test can be used) for the duration of the COVID-19 declaration under Section 564(b)(1) of the Act, 21 U.S.C.section 360bbb-3(b)(1), unless the authorization is terminated  or revoked sooner.       Influenza A by PCR NEGATIVE NEGATIVE Final   Influenza B by PCR NEGATIVE NEGATIVE Final    Comment: (NOTE) The Xpert Xpress SARS-CoV-2/FLU/RSV plus assay is intended as an aid in the diagnosis of influenza from Nasopharyngeal swab specimens and should not be used as a sole basis for treatment. Nasal washings and aspirates are unacceptable for Xpert Xpress SARS-CoV-2/FLU/RSV testing.  Fact Sheet for Patients: EntrepreneurPulse.com.au  Fact Sheet for Healthcare Providers: IncredibleEmployment.be  This test is not yet approved or cleared by the [place] FDA and has been authorized for detection and/or diagnosis of SARS-CoV-2 by FDA under an Emergency Use Authorization (EUA). This EUA will remain in effect (meaning this test can be used) for the duration of the COVID-19 declaration under Section 564(b)(1) of the Act, 21 U.S.C. section 360bbb-3(b)(1), unless the authorization is terminated or revoked.  Performed at [facility], [place] [address] [place], [place] [address]   Culture, blood (Routine X 2) w Reflex to ID Panel     Status: None (Preliminary result)   Collection Time: 08/07/21  6:22 PM   Specimen: BLOOD  Result Value Ref Range Status   Specimen Description   Final    BLOOD RIGHT ANTECUBITAL Performed at [place] [address] [place], [place] [address]    Special Requests   Final    BOTTLES DRAWN AEROBIC ONLY Blood Culture adequate volume Performed at [place] [address] [place], [place] [address]     Culture   Final    NO GROWTH < 24 HOURS Performed at [facility] [place] [address], [place], [place] [address]    Report Status PENDING  Incomplete         Radiology Studies: No results found.      Scheduled Meds:  apixaban  5 mg Oral BID   vitamin C  500 mg Oral BID   aspirin EC  81 mg Oral Daily   buPROPion  300 mg Oral Daily   busPIRone  15 mg Oral Daily   clotrimazole   Topical BID   cycloSPORINE  1 drop Both Eyes BID   diltiazem  180 mg Oral Daily   DULoxetine  60 mg Oral BID   feeding supplement  237 mL Oral TID BM   fluticasone  2 spray Each Nare Daily   folic acid  1 mg Oral Daily   gabapentin  300 mg Oral BID   ipratropium-albuterol  3 mL Nebulization QID   lidocaine  1 patch Transdermal Q24H   lurasidone  40 mg Oral QPC supper   lurasidone  40 mg Oral QPC supper   methylPREDNISolone (SOLU-MEDROL) injection  60 mg Intravenous Q12H   mycophenolate  1,500 mg Oral BID   OXcarbazepine  600 mg Oral Daily   OXcarbazepine  900 mg Oral QHS   oxyCODONE  10 mg Oral Q12H   tamsulosin  0.4 mg Oral Daily   traZODone  75 mg Oral QHS   Continuous Infusions:  dextrose 5 % and 0.9% NaCl 75 mL/hr at 08/08/21 2334     LOS: 2 days    Time spent:40 min    Gorje Iyer, Geraldo Docker, MD [facility]   If 7PM-7AM, please contact night-coverage 08/09/2021, 8:48 AM

## 2021-08-09 NOTE — Progress Notes (Signed)
Daily Progress Note   Patient Name: [name]       Date: 08/09/2021 DOB: 11-25-1950  Age: 71 y.o. MRN#: [id number] Attending Physician: Allie Bossier, MD Primary Care Physician: [facility] Admit Date: 08/07/2021  Reason for Consultation/Follow-up: Establishing goals of care, Non pain symptom management, and Pain control  Subjective: Patient is resting in bed.  I discussed with him about his CODE STATUS and goals of care, see below. Dr Earlie Server with medical oncology also visited with the patient to discuss with him regarding his further cancer directed treatment options.   Length of Stay: 2  Current Medications: Scheduled Meds:   apixaban  5 mg Oral BID   vitamin C  500 mg Oral BID   aspirin EC  81 mg Oral Daily   buPROPion  300 mg Oral Daily   busPIRone  15 mg Oral Daily   clotrimazole   Topical BID   cycloSPORINE  1 drop Both Eyes BID   diltiazem  180 mg Oral Daily   DULoxetine  60 mg Oral BID   feeding supplement  237 mL Oral TID BM   fluticasone  2 spray Each Nare Daily   folic acid  1 mg Oral Daily   gabapentin  300 mg Oral BID   ipratropium-albuterol  3 mL Nebulization TID   lidocaine  1 patch Transdermal Q24H   lurasidone  40 mg Oral QPC supper   lurasidone  40 mg Oral QPC supper   methylPREDNISolone (SOLU-MEDROL) injection  60 mg Intravenous Q12H   multivitamin with minerals  1 tablet Oral Daily   mycophenolate  1,500 mg Oral BID   OXcarbazepine  600 mg Oral Daily   OXcarbazepine  900 mg Oral QHS   oxyCODONE  10 mg Oral Q12H   polyethylene glycol  17 g Oral Daily   tamsulosin  0.4 mg Oral Daily   traZODone  75 mg Oral QHS    Continuous Infusions:  dextrose 5 % and 0.9% NaCl 75 mL/hr at 08/09/21 1158    PRN Meds: albuterol, clobetasol  ointment, ketorolac, methocarbamol, morphine injection, nitroGLYCERIN  Physical Exam         Awake alert oriented Sitting up in bed Diminished breath sounds left more than right Has generalized pain No acute distress  Vital Signs: BP (!) 151/75 (  BP Location: Left Arm)   Pulse 93   Temp 97.6 F (36.4 C) (Oral)   Resp 18   Ht _0  (1.88 m)   Wt (!) 147.4 kg   SpO2 98%   BMI 41.73 kg/m  SpO2: SpO2: 98 % O2 Device: O2 Device: Nasal Cannula O2 Flow Rate: O2 Flow Rate (L/min): 3 L/min  Intake/output summary:  Intake/Output Summary (Last 24 hours) at 08/09/2021 1614 Last data filed at 08/09/2021 1200 [place] per 24 hour  Intake 2134.27 ml  Output 1775 ml  Net 359.27 ml    LBM: Last BM Date : 08/06/21 Baseline Weight: Weight: (!) 147.4 kg Most recent weight: Weight: (!) 147.4 kg       Palliative Assessment/Data:      Patient Active Problem List   Diagnosis Date Noted   Metastatic carcinoma ([place]) 08/07/2021   Non-small cell lung cancer, left ([place]) 08/07/2021   Chronic respiratory failure with hypoxia (HCC) 08/07/2021   Rib pain 08/03/2021   Encounter for antineoplastic chemotherapy 07/20/2021   Leg paresthesia 06/17/2021   Sepsis ([place]) 06/17/2021   Obesity, Class III, BMI 40-49.9 (morbid obesity) ([place]) 06/17/2021   Pneumonia 06/17/2021   Acute respiratory failure with hypoxia ([place]) 06/04/2021   Acute on chronic respiratory failure with hypoxia ([place]) 06/04/2021   Hypertension    Allergic rhinitis    History of pulmonary embolism    Acute hypoxemic respiratory failure ([place]) 05/21/2021   Hyponatremia 05/20/2021   Acute pulmonary embolism without acute cor pulmonale ([place]) 02/03/2021   Mood disorder ([place]) 02/03/2021   Acute on chronic respiratory failure (HCC) 01/09/2021   Non-small cell cancer of left lung ([place]) 09/21/2020   Lung nodule 07/30/2020   Mass of left lung    Acute respiratory failure with hypoxemia ([place]) 07/26/2020   Constipation    HCAP  (healthcare-associated pneumonia) 04/03/2020   COPD with acute exacerbation ([place]) 01/05/2020   Vertigo 12/26/2019   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   Stage 3a chronic kidney disease ([place]) 12/26/2019   Abnormal CT of the chest 12/26/2019   COPD (chronic obstructive pulmonary disease) (HCC)    AKI (acute kidney injury) ([place])    Hyperglycemia 06/23/2019   OSA (obstructive sleep apnea) 06/23/2019   Suicidal ideation 06/23/2019   Major depressive disorder, recurrent episode ([place]) 10/12/2018   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   Cellulitis of both feet 07/16/2017   DM2 (diabetes mellitus, type 2) ([place]) 07/16/2017   HLA B27 (HLA B27 positive) 07/16/2017   Acute chest pain    Hyperkalemia    Spontaneous pneumothorax 06/27/2016   Hypoxia    Pneumothorax on left    Dyspnea 05/23/2014   COPD exacerbation ([place]) 05/23/2014   Malingering 01/21/2013   Depression 01/11/2013   Tobacco abuse 01/11/2013   Obesity, unspecified 01/11/2013   Chest pain 01/10/2013   Hypertension associated with diabetes ([place]) 01/10/2013    Palliative Care Assessment & Plan   Patient Profile:    Assessment: 71 year old with morbid obesity, hypertension diabetes, CKD, COPD, history of life limiting illness of non-small cell lung cancer, malignant pleural effusion, having recurrent hospitalizations, on home O2, underwent Pleurx cath placement in last hospitalization.  Recommendations/Plan: CODE STATUS and goals of care discussions undertaken again with the patient.  Also discussed about electing a HCPOA agent.  As per his discussions with oncology, patient wishes to pursue more treatments and is in favor of chemotherapy. He wants to go home towards the end of this hospitalization. He  will discuss with his brother in [place] about being designated as his Psychiatrist.  Full code, full scope for now.      Code Status:    Code Status Orders  (From admission, onward)            Start     Ordered   08/07/21 0951  Full code  Continuous        08/07/21 0956           Code Status History     Date Active Date Inactive Code Status Order ID Comments User Context   08/03/2021 1851 08/06/2021 0348 Full Code [id number]  Geradine Girt, DO Inpatient   06/17/2021 0046 07/06/2021 1726 Full Code [id number]  Ileene Musa T, DO ED   06/04/2021 0139 06/11/2021 1609 Full Code [id number]  Howerter, Ethelda Chick, DO ED   05/21/2021 2320 05/25/2021 0549 Full Code [id number]  Rehman, [place], DO ED   05/20/2021 0150 05/21/2021 2025 Full Code [id number]  Marshall, [place], DO ED   02/04/2021 0105 02/04/2021 2154 Full Code [id number]  Lenore Cordia, MD ED   01/09/2021 0612 01/10/2021 2012 Full Code [id number]  Shela Leff, MD Inpatient   09/04/2020 0923 09/05/2020 1724 Full Code [id number]  Jonnie Finner, DO ED   07/26/2020 1449 08/05/2020 1913 Full Code [id number]  Jonnie Finner, DO Inpatient   04/04/2020 0033 04/07/2020 2141 Full Code [id number]  Rise Patience, MD ED   01/04/2020 1615 01/06/2020 1904 Full Code [id number]  Jonnie Finner, DO Inpatient   12/26/2019 1039 12/28/2019 1810 Full Code [id number]  Karmen Bongo, MD ED   07/12/2019 0557 07/15/2019 1904 Full Code [id number]  Merrily Pew, MD ED   07/03/2019 0016 07/03/2019 2011 Full Code [id number]  Reubin Milan, MD ED   07/03/2019 0016 07/03/2019 0016 Full Code [id number]  Reubin Milan, MD ED   06/23/2019 0425 06/25/2019 2122 Full Code [id number]  Bunnie Pion Z, DO ED   10/10/2018 2021 10/13/2018 1641 Full Code [id number]  Ward, Ozella Almond, PA-C ED   04/30/2018 1759 05/01/2018 1714 Full Code [id number]  Delia Heady, PA-C ED   02/20/2018 1759 02/21/2018 1452 Full Code [id number]  Maudie Flakes, MD ED   07/16/2017 0309 07/21/2017 1734 Full Code [id number]  Etta Quill, DO ED   09/17/2016 2107 09/18/2016 2055 Full Code [id number]  Lovenia Kim, MD ED   06/27/2016 1523 07/07/2016 1718 Full Code [id number]  Nani Skillern, PA-C ED    05/23/2014 2356 05/26/2014 1656 Full Code [id number]  Jani Gravel, MD Inpatient   01/21/2013 2202 01/23/2013 2202 Full Code [id number]  Martie Lee, PA-C ED   01/12/2013 0020 01/12/2013 0641 Full Code [id number]  Margarita Mail, PA-C ED   01/10/2013 0122 01/11/2013 2122 Full Code [id number]  Rise Patience, MD Inpatient       Prognosis:  Unable to determine  Discharge Planning: To Be Determined  Care plan was discussed with patient , Dr Earlie Server and Dr. Sherral Hammers.  Thank you for allowing the Palliative Medicine Team to assist in the care of this patient.   Time In: 1500 Time Out: 1535 Total Time 35 Prolonged Time Billed  no       Greater than 50%  of this time was spent counseling and coordinating care related to the above assessment and plan.  Loistine Chance, MD  Please contact Palliative Medicine Team phone at [phone] for questions and concerns.

## 2021-08-09 NOTE — TOC Initial Note (Signed)
Transition of Care [facility]) - Initial/Assessment Note    Patient Details  Name: Mario [name] MRN: [id number] Date of Birth: Jan 26, 1951  Transition of Care [facility]) CM/SW Contact:    Leeroy Cha, RN Phone Number: 08/09/2021, 8:43 AM  Clinical Narrative:                 Possible snf placement.  Transition of Care [facility]) Screening Note   Patient Details  Name: Mario [name] Date of Birth: September 14, 1950   Transition of Care [facility]) CM/SW Contact:    Leeroy Cha, RN Phone Number: 08/09/2021, 8:44 AM    Transition of Care Department [facility]) has reviewed patient and no TOC needs have been identified at this time. We will continue to monitor patient advancement through interdisciplinary progression rounds. If new patient transition needs arise, please place a TOC consult.    Expected Discharge Plan: Skilled Nursing Facility Barriers to Discharge: Continued Medical Work up   Patient Goals and CMS Choice Patient states their goals for this hospitalization and ongoing recovery are:: to go home CMS Medicare.gov Compare Post Acute Care list provided to:: Patient Choice offered to / list presented to : Patient  Expected Discharge Plan and Services Expected Discharge Plan: [place]   Discharge Planning Services: CM Consult   Living arrangements for the past 2 months: Apartment                   DME Agency: Other - Comment                  Prior Living Arrangements/Services Living arrangements for the past 2 months: Apartment Lives with:: Roommate Patient language and need for interpreter reviewed:: Yes Do you feel safe going back to the place where you live?: Yes               Activities of Daily Living Home Assistive Devices/Equipment: CPAP, Oxygen ADL Screening (condition at time of admission) Patient's cognitive ability adequate to safely complete daily activities?: Yes Is the patient deaf or have difficulty hearing?: No Does the  patient have difficulty seeing, even when wearing glasses/contacts?: No Does the patient have difficulty concentrating, remembering, or making decisions?: No Patient able to express need for assistance with ADLs?: Yes Does the patient have difficulty dressing or bathing?: No Independently performs ADLs?: No Communication: Independent Dressing (OT): Independent Is this a change from baseline?: Pre-admission baseline Grooming: Independent Feeding: Independent Bathing: Independent Is this a change from baseline?: Pre-admission baseline Toileting: Needs assistance Is this a change from baseline?: Pre-admission baseline In/Out Bed: Needs assistance Is this a change from baseline?: Pre-admission baseline Walks in Home: Needs assistance Is this a change from baseline?: Pre-admission baseline Does the patient have difficulty walking or climbing stairs?: Yes Weakness of Legs: Both Weakness of Arms/Hands: Both  Permission Sought/Granted                  Emotional Assessment Appearance:: Appears stated age     Orientation: : Oriented to Self, Oriented to Place, Oriented to  Time, Oriented to Situation Alcohol / Substance Use: Not Applicable Psych Involvement: No (comment)  Admission diagnosis:  HCAP (healthcare-associated pneumonia) [J18.9] Pneumonia of right lung due to infectious organism, unspecified part of lung [J18.9] Patient Active Problem List   Diagnosis Date Noted   Metastatic carcinoma ([place]) 08/07/2021   Non-small cell lung cancer, left ([place]) 08/07/2021   Chronic respiratory failure with hypoxia ([place]) 08/07/2021   Rib pain 08/03/2021   Encounter  for antineoplastic chemotherapy 07/20/2021   Leg paresthesia 06/17/2021   Sepsis ([place]) 06/17/2021   Obesity, Class III, BMI 40-49.9 (morbid obesity) ([place]) 06/17/2021   Pneumonia 06/17/2021   Acute respiratory failure with hypoxia ([place]) 06/04/2021   Acute on chronic respiratory failure with hypoxia ([place]) 06/04/2021    Hypertension    Allergic rhinitis    History of pulmonary embolism    Acute hypoxemic respiratory failure ([place]) 05/21/2021   Hyponatremia 05/20/2021   Acute pulmonary embolism without acute cor pulmonale (HCC) 02/03/2021   Mood disorder ([place]) 02/03/2021   Acute on chronic respiratory failure ([place]) 01/09/2021   Non-small cell cancer of left lung ([place]) 09/21/2020   Lung nodule 07/30/2020   Mass of left lung    Acute respiratory failure with hypoxemia ([place]) 07/26/2020   Constipation    HCAP (healthcare-associated pneumonia) 04/03/2020   COPD with acute exacerbation ([place]) 01/05/2020   Vertigo 12/26/2019   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   Stage 3a chronic kidney disease ([place]) 12/26/2019   Abnormal CT of the chest 12/26/2019   COPD (chronic obstructive pulmonary disease) ([place])    AKI (acute kidney injury) ([place])    Hyperglycemia 06/23/2019   OSA (obstructive sleep apnea) 06/23/2019   Suicidal ideation 06/23/2019   Major depressive disorder, recurrent episode ([place]) 10/12/2018   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   Cellulitis of both feet 07/16/2017   DM2 (diabetes mellitus, type 2) ([place]) 07/16/2017   HLA B27 (HLA B27 positive) 07/16/2017   Acute chest pain    Hyperkalemia    Spontaneous pneumothorax 06/27/2016   Hypoxia    Pneumothorax on left    Dyspnea 05/23/2014   COPD exacerbation ([place]) 05/23/2014   Malingering 01/21/2013   Depression 01/11/2013   Tobacco abuse 01/11/2013   Obesity, unspecified 01/11/2013   Chest pain 01/10/2013   Hypertension associated with diabetes ([place]) 01/10/2013   PCP:  Clinic, [place]:   [place], [place] - [place] [address] [place] [address] Phone: [phone] Fax: [phone]     Social Determinants of Health (SDOH) Interventions    Readmission Risk Interventions    08/05/2021   11:02 AM  06/07/2021    4:15 PM  Readmission Risk Prevention Plan  Transportation Screening Complete Complete  Medication Review (RN Care Manager) Complete Complete  PCP or Specialist appointment within 3-5 days of discharge Complete Complete  HRI or Home Care Consult Complete Complete  SW Recovery Care/Counseling Consult Complete Complete  Palliative Care Screening Not Applicable Not [place] Not Applicable Not Applicable

## 2021-08-09 NOTE — Evaluation (Addendum)
Physical Therapy Evaluation Patient Details Name: Mario [name] MRN: [id number] DOB: June 07, 1950 Today's Date: 08/09/2021  History of Present Illness  patient is a 71 year old male who was noted to be recently discharged on 6/8 with recurrent left side malignant pleural effusions.patient was admitted with recurrent left pleural effusion.  PMH: acute on chronic resp failure - COPD, CAP, sepsis and hx of [place] lung CA stage II s/p radiation, PE in 2023, OSA on CPAP, HTN, chronic back pain, hx of ankylosing spondylitis, anxiety, and depression, T2DM.  Clinical Impression  Pt admitted with above diagnosis.  Pt doing well overall today. Reports he has had a few falls at home. Normally is independent, does not leave his apartment often. Recommend HHPT vs SNF pending progress ina cute setting.  Pt is unsure today if he is agreeable to rehab, he states he will not go back to an "awful place like [facility]"   Pt currently with functional limitations due to the deficits listed below (see PT Problem List). Pt will benefit from skilled PT to increase their independence and safety with mobility to allow discharge to the venue listed below.          Recommendations for follow up therapy are one component of a multi-disciplinary discharge planning process, led by the attending physician.  Recommendations may be updated based on patient status, additional functional criteria and insurance authorization.  Follow Up Recommendations Home health PT (vs SNF)    Assistance Recommended at Discharge Frequent or constant Supervision/Assistance  Patient can return home with the following  Assist for transportation;Help with stairs or ramp for entrance    Equipment Recommendations None recommended by PT  Recommendations for Other Services       Functional Status Assessment Patient has had a recent decline in their functional status and demonstrates the ability to make significant improvements in function  in a reasonable and predictable amount of time.     Precautions / Restrictions Restrictions Weight Bearing Restrictions: No      Mobility  Bed Mobility   Bed Mobility: Supine to Sit     Supine to sit: Modified independent (Device/Increase time)     General bed mobility comments: bed flat, no assist    Transfers Overall transfer level: Needs assistance Equipment used: Rolling walker (2 wheels) Transfers: Sit to/from Stand Sit to Stand: Min guard           General transfer comment: incr time    Ambulation/Gait Ambulation/Gait assistance: Min guard, Supervision Gait Distance (Feet): 75 Feet Assistive device: Rolling walker (2 wheels) Gait Pattern/deviations: Step-through pattern       General Gait Details: min/guard for safety and line management; SpO2=97% on 3L (3L is pt baseline)  Stairs            Wheelchair Mobility    Modified Rankin (Stroke Patients Only)       Balance Overall balance assessment: Mild deficits observed, not formally tested                                           Pertinent Vitals/Pain Pain Assessment Pain Assessment: 0-10 Pain Score: 5  Pain Location: shoulder Pain Descriptors / Indicators: Aching, Grimacing Pain Intervention(s): Premedicated before session, Repositioned, Limited activity within patient's tolerance, Monitored during session    Home Living Family/patient expects to be discharged to:: Unsure Living Arrangements: Other (Comment) (roommate that is  on liver transplant list)   Type of Home: Apartment Home Access: Stairs to enter   Entrance Stairs-Number of Steps: 3 "awful steps"   Home Layout: One level Home Equipment: Cane - single point;Shower seat;Rolling Walker (2 wheels) Additional Comments: wears 3 L O2 at baseline. patient reported that roommate cannot help with things as he has his own health issues at this time.    Prior Function Prior Level of Function : Independent/Modified  Independent             Mobility Comments: ambulates within his home without an AD; typically utilizes a w/c when at MD  appointments; otherwise does not leave his home; uses [employer] for groceries       Hand Dominance        Extremity/Trunk Assessment   Upper Extremity Assessment Upper Extremity Assessment: Defer to OT evaluation    Lower Extremity Assessment Lower Extremity Assessment: Overall WFL for tasks assessed       Communication   Communication: No difficulties  Cognition Arousal/Alertness: Awake/alert Behavior During Therapy: WFL for tasks assessed/performed Overall Cognitive Status: Within Functional Limits for tasks assessed                                          General Comments      Exercises     Assessment/Plan    PT Assessment Patient needs continued PT services  PT Problem List Decreased strength       PT Treatment Interventions DME instruction;Therapeutic exercise;Gait training;Functional mobility training;Therapeutic activities;Patient/family education    PT Goals (Current goals can be found in the Care Plan section)  Acute Rehab PT Goals Patient Stated Goal: home vs rehab PT Goal Formulation: With patient Time For Goal Achievement: 08/23/21 Potential to Achieve Goals: Good    Frequency Min 3X/week     Co-evaluation               AM-PAC PT "6 Clicks" Mobility  Outcome Measure Help needed turning from your back to your side while in a flat bed without using bedrails?: A Little Help needed moving from lying on your back to sitting on the side of a flat bed without using bedrails?: A Little Help needed moving to and from a bed to a chair (including a wheelchair)?: A Little Help needed standing up from a chair using your arms (e.g., wheelchair or bedside chair)?: A Little Help needed to walk in hospital room?: A Little Help needed climbing 3-5 steps with a railing? : A Little 6 Click Score: 18    End of  Session Equipment Utilized During Treatment: Gait belt Activity Tolerance: Patient tolerated treatment well Patient left: with call bell/phone within reach;in bed;with bed alarm set;with nursing/sitter in room Nurse Communication: Mobility status PT Visit Diagnosis: Other abnormalities of gait and mobility (R26.89);Difficulty in walking, not elsewhere classified (R26.2)    Time: 3559-7416 PT Time Calculation (min) (ACUTE ONLY): 16 min   Charges:   PT Evaluation $PT Eval Low Complexity: [facility] [place], PT  Acute Rehab Dept ([place]) [phone] Pager [phone]  08/09/2021   [facility] 08/09/2021, 1:35 PM

## 2021-08-09 NOTE — Progress Notes (Signed)
Nutrition Brief Note  Patient identified on the Malnutrition Screening Tool (MST) Report with note in reporting indicating that patient shared he does not like the food at rehab.   Wt Readings from Last 15 Encounters:  08/07/21 (!) 147.4 kg  08/03/21 (!) 147.4 kg  07/20/21 (!) 145.7 kg  07/05/21 (!) 149.6 kg  06/11/21 (!) 152.1 kg  05/22/21 (!) 147 kg  02/05/21 (!) 147 kg  01/20/21 (!) 147 kg  01/20/21 (!) 149.1 kg  01/11/21 (!) 154.2 kg  01/09/21 (!) 152 kg  12/30/20 (!) 154.2 kg  12/25/20 (!) 154.2 kg  11/19/20 (!) 153.4 kg  09/21/20 (!) 156 kg    Body mass index is 41.73 kg/m. Patient meets criteria for morbid obesity based on current BMI. Skin WDL.  Patient confirms that he does not like the food at [facility]. The food here is slightly better although he shares that it is not much better. He was able to eat some of breakfast and lunch today.   He has Ensure ordered TID, each supplement provides 350 kcal and 20 grams protein. He began drinking these prior to going to rehab at the recommendation of RD at the [place]. He is agreeable to continue to receive supplements; he has accepted them each time he receives them.  Patient does not have any family or friends in the area who could bring him food that he better enjoys.   Current diet order is Heart Healthy. Labs and medications reviewed.   No nutrition interventions warranted at this time. If nutrition issues arise, please consult RD.      Jarome Matin, MS, RD, LDN Registered Dietitian II Inpatient Clinical Nutrition RD pager # and on-call/weekend pager # available in [facility]

## 2021-08-09 NOTE — Progress Notes (Addendum)
HEMATOLOGY-ONCOLOGY PROGRESS NOTE  ASSESSMENT AND PLAN: This is a very pleasant 71 year old white male diagnosed with metastatic non-small cell lung cancer initially diagnosed as stage IIb (T3, N0, M0) non-small cell lung cancer, adenocarcinoma presented with left lower lobe lung mass diagnosed in June 2022 status post SBRT to the left lower lobe lung mass under the care of Dr. Sondra Come completed on 10/15/2020.  He has evidence of malignant left pleural effusion in May 2023.  He has had several hospital admissions over the past 2 to 3 months due to shortness of breath and pneumonia.  He was found to have a malignant pleural effusion and has a Pleurx catheter in place.  He underwent Guardant360 testing in our office at the last visit.  Results are not yet scanned into epic.  However, discussed with Dr. Julien Nordmann who indicates that he does not have an actionable mutation that can be used for first line therapy.  Therefore, we would need to consider systemic chemotherapy versus palliative care/hospice.    The patient has indicated this morning that he would like to consider treatment if it would help his condition.  He will have further discussion later today with Dr. Julien Nordmann.  However, his performance status may be too poor to consider systemic treatment.  Mario [name]  SUBJECTIVE: Mr. Mario [name] is followed by our office for metastatic non-small cell lung cancer, adenocarcinoma that was initially diagnosed as stage IIb (T3, N0, M0).  He received SBRT to his left lower lobe lung mass under the care of Dr. Sondra Come which was completed 10/15/2020.  The patient was found to have an enlarging left pleural effusion and underwent ultrasound-guided thoracentesis.  Final cytology showed metastatic adenocarcinoma.  He was referred back to medical oncology to discuss treatment options.  We have sent for PD-L1 and molecular studies on him.  He has not yet followed up in our office to discuss these results.  He has had 2  hospital admissions this month due to shortness of breath.  During his last hospital admission, a Pleurx catheter was placed on the left.  He was discharged home on 08/05/2021.  However, he developed increased shortness of breath and presented again to the emergency department.  Chest x-ray performed on admission showed a decrease in volume of the loculated left pleural effusion, status post chest tube placement.  He was found to have new airspace consolidation within the right lower lung.  Today, the patient is sitting up on the side of the bed.  He reports ongoing shortness of breath.  He states that he did not notice much improvement after fluid was removed from his lung last admission.  He is on antibiotics for possible pneumonia.  He is also receiving IV Solu-Medrol and nebulizers.  Oncology History  Non-small cell cancer of left lung ([place])  09/21/2020 Initial Diagnosis   Non-small cell cancer of left lung ([place])   09/22/2020 Cancer Staging   Staging form: Lung, AJCC 8th Edition - Clinical: Stage IVA (cT3, cN0, cM1a) - Signed by Curt Bears, MD on 07/20/2021    REVIEW OF SYSTEMS:   Review of Systems  Constitutional:  Positive for malaise/fatigue.  HENT: Negative.    Respiratory:  Positive for shortness of breath.   Cardiovascular: Negative.   Gastrointestinal: Negative.   Skin: Negative.   Neurological: Negative.     I have reviewed the past medical history, past surgical history, social history and family history with the patient and they are unchanged from previous note.  PHYSICAL EXAMINATION: ECOG PERFORMANCE STATUS: 2 - Symptomatic, <50% confined to bed  Vitals:   08/09/21 0338 08/09/21 0909  BP: 131/69 (!) 146/71  Pulse: 78   Resp: 18   Temp: 98.5 F (36.9 C)   SpO2: 94%    Filed Weights   08/07/21 0444  Weight: (!) 147.4 kg    Intake/Output from previous day: 06/11 0701 - 06/12 0700 In: 2286.4 [P.O.:700; I.V.:1586.4] Out: 1300 [Urine:1300]  Physical  Exam Vitals reviewed.  Constitutional:      General: He is not in acute distress. HENT:     Head: Normocephalic.  Eyes:     General: No scleral icterus.    Conjunctiva/sclera: Conjunctivae normal.  Cardiovascular:     Rate and Rhythm: Normal rate and regular rhythm.  Pulmonary:     Comments: Diminished breath sounds bilaterally Abdominal:     Palpations: Abdomen is soft.     Tenderness: There is no abdominal tenderness.  Skin:    General: Skin is warm and dry.  Neurological:     Mental Status: He is alert and oriented to person, place, and time.    LABORATORY DATA:  I have reviewed the data as listed    Latest Ref Rng & Units 08/08/2021    7:23 AM 08/07/2021   10:36 AM 08/07/2021    6:04 AM  CMP  Glucose 70 - 99 mg/dL 149   105   BUN 8 - 23 mg/dL 13   15   Creatinine 0.61 - 1.24 mg/dL 0.73  0.85  0.94   Sodium 135 - 145 mmol/L 138   134   Potassium 3.5 - 5.1 mmol/L 4.6   4.5   Chloride 98 - 111 mmol/L 104   100   CO2 22 - 32 mmol/L 28   27   Calcium 8.9 - 10.3 mg/dL 8.6   9.1   Total Protein 6.5 - 8.1 g/dL 6.0   6.7   Total Bilirubin 0.3 - 1.2 mg/dL 0.2   0.5   Alkaline Phos 38 - 126 U/L 72   93   AST 15 - 41 U/L 12   16   ALT 0 - 44 U/L 11   14     Lab Results  Component Value Date   WBC 15.4 (H) 08/09/2021   HGB 9.9 (L) 08/09/2021   HCT 32.0 (L) 08/09/2021   MCV 95.0 08/09/2021   PLT 410 (H) 08/09/2021   NEUTROABS 14.4 (H) 08/09/2021    No results found for: "CEA1", "CEA", "LXB262", "CA125", "PSA1"  DG Chest Port 1 View  Result Date: 08/07/2021 CLINICAL DATA:  Left-sided rib pain for 2 months. Painful with movement. Wrist sent rib fracture. EXAM: PORTABLE CHEST 1 VIEW COMPARISON:  CT angio chest 08/03/2021 FINDINGS: There is a left-sided chest tube in place with tip terminating along the left heart border. No pneumothorax identified. Loculated left pleural effusion appears decreased in volume from the previous exam. Large bulla within the left base is in the  projection of the loculated left pleural effusion. Decreased aeration to the left lower lobe is unchanged compatible with atelectasis and airspace consolidation. New airspace opacities within the right lower lung are identified. IMPRESSION: 1. Decrease in volume of loculated left pleural effusion status post chest tube placement. 2. Persistent atelectasis and airspace consolidation involving the left lower lobe. 3. New airspace consolidation within the right lower lung. Electronically Signed   By: Kerby Moors M.D.   On: 08/07/2021 06:54   IR PERC PLEURAL  DRAIN W/INDWELL CATH W/IMG GUIDE  Result Date: 08/04/2021 INDICATION: Recurrent malignant left pleural effusion, lung cancer EXAM: ULTRASOUND AND FLUOROSCOPIC LEFT CHEST TUNNELED PLEURAL DRAIN (PLEURX CATHETER) MEDICATIONS: 2 G ANCEF IV WITHIN 1 HOUR OF THE PROCEDURE ANESTHESIA/SEDATION: Moderate (conscious) sedation was employed during this procedure. A total of Versed 1.5 mg and Fentanyl 100 mcg was administered intravenously by the radiology nurse. Total intra-service moderate Sedation Time: 16 minutes. The patient's level of consciousness and vital signs were monitored continuously by radiology nursing throughout the procedure under my direct supervision. COMPLICATIONS: None immediate. PROCEDURE: Informed written consent was obtained from the patient after a thorough discussion of the procedural risks, benefits and alternatives. All questions were addressed. Maximal Sterile Barrier Technique was utilized including caps, mask, sterile gowns, sterile gloves, sterile drape, hand hygiene and skin antiseptic. A timeout was performed prior to the initiation of the procedure. Previous imaging reviewed. Patient positioned right side down decubitus. Preliminary ultrasound performed. The left effusion was localized and marked in the mid axillary line through a lower intercostal space. Under sterile conditions and local anesthesia, the percutaneous access needle was  advanced under direct ultrasound through the lower intercostal space into the pleural fluid. Needle position confirmed with ultrasound. Images obtained for documentation. There was return of pleural fluid. Amplatz guidewire inserted into the pleural space. Under sterile conditions and local anesthesia, a subcutaneous tunnel was created in the adjacent anterior chest soft tissues. PleurX catheter was tunneled subcutaneously to the entry site and advanced into the pleural space through the peel-away sheath. Position again confirmed with ultrasound and fluoroscopy. Following insertion, 1.2 L thoracentesis performed. Catheter secured with Prolene suture. Entry site closed with subcuticular Vicryl suture and Dermabond. Sterile dressing applied. No immediate complication. Patient tolerated the procedure well. IMPRESSION: Successful ultrasound and fluoroscopic left chest tunneled pleural drain (PleurX catheter. 1.2 L thoracentesis performed as well. Electronically Signed   By: Jerilynn Mages.  Shick M.D.   On: 08/04/2021 17:22   CT Angio Chest PE W and/or Wo Contrast  Result Date: 08/03/2021 CLINICAL DATA:  Pulmonary embolism (PE) suspected, high prob Worsening shortness of breath over the last 4 days. Left-sided chest pain. Patient with history of lung cancer. EXAM: CT ANGIOGRAPHY CHEST WITH CONTRAST TECHNIQUE: Multidetector CT imaging of the chest was performed using the standard protocol during bolus administration of intravenous contrast. Multiplanar CT image reconstructions and MIPs were obtained to evaluate the vascular anatomy. RADIATION DOSE REDUCTION: This exam was performed according to the departmental dose-optimization program which includes automated exposure control, adjustment of the mA and/or kV according to patient size and/or use of iterative reconstruction technique. CONTRAST:  35m OMNIPAQUE IOHEXOL 350 MG/ML SOLN COMPARISON:  Radiograph earlier today.  Chest CT 06/23/2021 FINDINGS: Cardiovascular: There are no  filling defects within the pulmonary arteries to suggest pulmonary embolus. Portions of the left lower lobe subsegmental branches are attenuated and not well assessed. Additionally there is motion artifact obscuring the bases. Atherosclerosis of the thoracic aorta. No dissection or acute aortic findings. Coronary artery calcifications. The heart is normal in size. No pericardial effusion. Mediastinum/Nodes: Loculated pleural fluid tracks along the left aspect of the mediastinum. No enlarged mediastinal or hilar lymph nodes. No axillary or supraclavicular adenopathy. Lungs/Pleura: Partially loculated left pleural effusion has progressed in size from CT 6 weeks ago, now moderate to large. Loculated component tracks anteriorly at the lung base and medially adjacent to the mediastinum. A bulla at the left lung base measures 6.4 cm, decreased in size from prior exam when it  measured 8 cm, and is elevated related to adjacent pleural effusion. There is associated airspace disease and/or atelectasis in the left lower and left upper lobe. Loculated fluid tracks into the inter lobar fissure on the left. Areas of pleural enhancement in the basilar hemithorax, series 5, image 123 and 128. Underlying emphysema. Bulla at the right lung base are unchanged. No acute right lung airspace disease. No right pleural effusion. Suspected basilar linear atelectasis, although obscured by motion. Upper Abdomen: Limited assessment of the upper abdomen demonstrates no acute findings. Musculoskeletal: There is a nondisplaced fracture of the left anterior seventh rib. No evidence of underlying lesion. Thoracic spondylosis. Review of the MIP images confirms the above findings. IMPRESSION: 1. No pulmonary embolus. 2. Moderate to large partially loculated left pleural effusion has progressed in size from CT 6 weeks ago. Loculated component tracks anteriorly at the lung base and medially adjacent to the mediastinum. There is associated airspace  disease and/or atelectasis in the left lower and left upper lobe. A bulla at the left lung base has decreased in size from prior exam, and is elevated related to adjacent pleural fluid. Areas of pleural enhancement in the basilar hemithorax raises concern for underlying pleural based metastatic disease. 3. Nondisplaced left anterior seventh rib fracture. 4. Emphysema.  Stable size of right basilar bulla. 5. Aortic atherosclerosis.  Coronary artery calcifications. Aortic Atherosclerosis (ICD10-I70.0) and Emphysema (ICD10-J43.9). Electronically Signed   By: Keith Rake M.D.   On: 08/03/2021 15:24   DG Chest 2 View  Result Date: 08/03/2021 CLINICAL DATA:  Shortness of breath. EXAM: CHEST - 2 VIEW COMPARISON:  07/04/2021 FINDINGS: Asymmetric elevation left hemidiaphragm with left base collapse/consolidation and probable effusion. Bleb at the left base is stable in the interval. The cardio pericardial silhouette is enlarged. Telemetry leads overlie the chest. IMPRESSION: Asymmetric elevation left hemidiaphragm with left base collapse/consolidation and probable effusion. Electronically Signed   By: Misty Stanley M.D.   On: 08/03/2021 11:49     Future Appointments  Date Time Provider [place]  08/19/2021 11:00 AM [facility] None  08/19/2021 11:30 AM Heilingoetter, Cassandra L, PA-C [facility] None      LOS: 2 days   ADDENDUM: Hematology/Oncology Attending: I had a face-to-face encounter with the patient today.  I reviewed his records and recommended his care plan.  I agree with the above note.  This is a very pleasant 71 years old white male with metastatic non-small cell lung cancer, adenocarcinoma with positive inhibitors if the patient and PD-L1 expression of 40%.  The patient was initially diagnosed as a stage IIb non-small cell lung cancer, adenocarcinoma in June 2022 status post SBRT but unfortunately he was found to have evidence for disease metastasis few weeks ago.   The patient had significant respiratory symptoms secondary to COPD and the recurrent left pleural effusion. I had a lengthy discussion with the patient today about his condition and treatment options. I explained to the patient that he has incurable condition and all the treatment will be of palliative nature. I gave him the option of palliative care and hospice referral versus consideration of palliative systemic chemotherapy with immunotherapy initially followed by targeted therapy with KRAS G12C mutation inhibitor if the patient fails a first-line treatment. The patient would like to try some treatment first and not considering the palliative care right now. He was seen during my visit by Dr. Rowe Pavy from the palliative care team. I will arrange for the patient a follow-up appointment with me at the  cancer center within 1 week after discharge to discuss the systemic therapy in more details. The patient was advised to call if he has any concerning issues after discharge. Thank you for taking good care of Mr. Jungwirth, we will continue to follow-up the patient with you and assist in his management on as-needed basis. Disclaimer: This note was dictated with voice recognition software. Similar sounding words can inadvertently be transcribed and may be missed upon review. Eilleen Kempf, MD

## 2021-08-10 LAB — MAGNESIUM: Magnesium: 1.9 mg/dL (ref 1.7–2.4)

## 2021-08-10 LAB — CBC WITH DIFFERENTIAL/PLATELET
Abs Immature Granulocytes: 0.21 10*3/uL — ABNORMAL HIGH (ref 0.00–0.07)
Basophils Absolute: 0 10*3/uL (ref 0.0–0.1)
Basophils Relative: 0 %
Eosinophils Absolute: 0 10*3/uL (ref 0.0–0.5)
Eosinophils Relative: 0 %
HCT: 32 % — ABNORMAL LOW (ref 39.0–52.0)
Hemoglobin: 10.2 g/dL — ABNORMAL LOW (ref 13.0–17.0)
Immature Granulocytes: 1 %
Lymphocytes Relative: 3 %
Lymphs Abs: 0.5 10*3/uL — ABNORMAL LOW (ref 0.7–4.0)
MCH: 29.5 pg (ref 26.0–34.0)
MCHC: 31.9 g/dL (ref 30.0–36.0)
MCV: 92.5 fL (ref 80.0–100.0)
Monocytes Absolute: 0.5 10*3/uL (ref 0.1–1.0)
Monocytes Relative: 3 %
Neutro Abs: 14.7 10*3/uL — ABNORMAL HIGH (ref 1.7–7.7)
Neutrophils Relative %: 93 %
Platelets: 421 10*3/uL — ABNORMAL HIGH (ref 150–400)
RBC: 3.46 MIL/uL — ABNORMAL LOW (ref 4.22–5.81)
RDW: 14.5 % (ref 11.5–15.5)
WBC: 15.9 10*3/uL — ABNORMAL HIGH (ref 4.0–10.5)
nRBC: 0 % (ref 0.0–0.2)

## 2021-08-10 LAB — LEGIONELLA PNEUMOPHILA SEROGP 1 UR AG: L. pneumophila Serogp 1 Ur Ag: NEGATIVE

## 2021-08-10 LAB — PHOSPHORUS: Phosphorus: 2.7 mg/dL (ref 2.5–4.6)

## 2021-08-10 MED ORDER — OXYCODONE HCL 5 MG PO TABS
10.0000 mg | ORAL_TABLET | Freq: Once | ORAL | Status: AC
  Administered 2021-08-10: 10 mg via ORAL
  Filled 2021-08-10: qty 2

## 2021-08-10 NOTE — Progress Notes (Signed)
Discharge instructions given and explained to patient, he verbalized understanding, patient in no distress. Follow-up with home health at home. No wound noted. Patient waiting on [facility] for transportation

## 2021-08-10 NOTE — Discharge Summary (Signed)
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  I offered him the option of palliative care and hospice referral versus consideration of systemic treatment based on the molecular studies and PD-L1 expression. The patient is still interested in some form of treatment.  I will wait for the molecular studies before making final decision regarding his treatment. -hold Eliquis until decision made -6/ 7 s/p placement LEFT Pleurx cath by IR  -On discharge (hold) Methotrexate given his recurrent pleural effusion, places him at risk for Methotrexate toxicity.  PCP/oncologist to decide when/if to restart   Metastatic carcinoma/NSCLC LEFT lung - Per Dr. Earlie Server palliative care and hospice referral versus consideration of systemic treatment based on the molecular studies and PD-L1 expression. -Since patient not started on any treatment would appear he is not a good candidate for treatment - Follow-up with Dr. Earlie Server Oncology.  To discuss inpatient hospice vs residential hospice vs systemic treatment -Prior to discharge discussed at length patient's wishes was very confused as to if he should proceed with hospice.  Counseled that he could not just stay in the hospital long-term (very apprehensive about returning home). -6/10 attempted to contact Dr. Lorna Few oncology through chat, however he is unavailable.  We will attempt to contact again in the A.m. given that patient unable to remain out of the hospital for any length of time believe any further treatment is FUTILE CARE.  Believe patient is in his terminal phase of  disease process. - 6/10 will proceed to have additional physician review case and if he agrees FUTILE CARE all aggressive care will be discontinued and patient will be offered hospice care. -6/10 patient has agreed to discuss residential hospice with palliative care -6/11 discussed case with Dr. Alen Blew oncology unsure of what Dr. Earlie Server treatment plan is.  Verified Dr. Earlie Server returns in A.m. requested I contact him. -6/12 have informed patient that general chemo therapy most likely is off the table given his deteriorating condition, and lack of social support.  However Dr. Earlie Server oncology to see patient today await recommendations -6/13 patient discharged again emphasized that he must remain out of the hospital in order to receive chemotherapy.  Follow-up with Dr. Earlie Server oncology   Pain control -6/12 continue current pain medication   HCAP -6/10 reviewed PCXR do not believe patient has pneumonia.  Negative fever, negative leukocytosis (even after steroid dose).  The patient's admission patient more on his ongoing progressive NSCLC. - 6/10 we will continue antibiotic for now.     Latest Reference Range & Units 08/07/21 18:22 08/08/21 00:23 08/08/21 05:45  Lactic Acid, Venous 0.5 - 1.9 mmol/L 1.2 1.3 1.1      Latest Reference Range & Units 08/07/21 18:22 08/08/21 07:23  Procalcitonin ng/mL <0.10 <0.10  - DuoNeb QID - Flutter valve - Incentive spirometry -Solu-Medrol 60 mg BID -HCAP RULED OUT.  DC antibiotics   Acute on Chronic  respiratory failure with Hypoxia  -continue 3L O2 -6/12 titrate O2 to maintain SPO2> 2% -6/13 Home RN, arranged to help facilitate Pleurx draining.   OSA - CPAP per respiratory   Nondisplaced left anterior seventh rib fracture. -resume pain meds   Morbid obesity BMI 41.73  kg/m -Given patient's deteriorating physical condition not a factor.       Goals of care - 6/7 palliative consult: [name] to evaluate when patient follows up with Dr.  Earlie Server for changing CODE STATUS to DNR at a minimum.  Patient will require HCPOA sooner rather than later -6/9 Palliative Care consult: Spoke with Dr. Rowe Pavy Palliative Care who will see patient again, just discharged 48 hours ago for the exact same reason.  SOB/rib pain.  Likelihood of patient making any meaningful recovery HIGHLY UNLIKELY as evidence that he was only able to stay out of the hospital for 48 hours.  Highly recommend residential hospice.   -6/11 consult TOC: SNF placement -6/11 PT/OT consult:Patient with NSCLC, not on treatment with deteriorating health unable to care for himself secondary to increasing weakness (not safe to discharge home), evaluate for CIR vs SNF  Discharge Diagnoses:  Principal Problem:   HCAP (healthcare-associated pneumonia) Active Problems:   Acute on chronic respiratory failure (HCC)   Obesity, unspecified   HLA B27 (HLA B27 positive)   OSA (obstructive sleep apnea)   Non-small cell cancer of left lung (HCC)   Obesity, Class III, BMI 40-49.9 (morbid obesity) ([place])   Rib pain   Metastatic carcinoma (HCC)   Non-small cell lung cancer, left (HCC)   Chronic respiratory failure with hypoxia ([place])   Discharge Condition: Guarded   Diet recommendation: Regular  Filed Weights   08/07/21 0444  Weight: (!) 147.4 kg    History of present illness:  Mario [name] is a  71 y.o. WM PMHx Morbid obesity, HTN, DM Type II, CKD, COPD, OSA and NSCLC (follows with Dr. Mckinley Jewel).  Chronic respiratory failure with hypoxia, 3L O2 at home.  Hospitalized in May with enlarging pleural effusion.  He underwent thoracentesis and cytology showed metastatic carcinoma.  He was d/c'd to SNF and recently released from SNF to live with a "roommate".  Patient just discharged on 08/05/2021 for recurrent LEFT side malignant pleural effusion s/p Pleurx cath placement.  Patient was discharged with home RN, home PT, home OT.       Patient here for concern that his lung is filling back  up with fluid.  He has a history of a recurrent malignant pleural effusion.  He was recently admitted to the hospital and a catheter was placed.  He was drained during his last stay in the hospital and was going to have it drained again in 1 week.  He feels like it is full again.  Having some shortness of breath.  Not significantly worse than his baseline, explains he has a history of COPD.  He is having some left-sided rib pain.  Was told he has a rib fracture.  He was discharged 2 days ago, has had some issues with ambulating, feels weak, has had a few instances where he almost fell.  Hospital Course:  See above  Procedures: 6/10 PCXR Decrease in volume of loculated left pleural effusion status post chest tube placement. 2. Persistent atelectasis and airspace consolidation involving the left lower lobe. 3. New airspace consolidation within the right lower lung.    Consultations: Palliative care Oncology   Cultures  6/10 influenza A/B negative 6/10 SARS coronavirus negative 6/10 respiratory virus panel negative  6/10 sputum pending 6/10 blood RIGHT AC NGTD 6/10 blood LEFT AC NGTD    Antibiotics Anti-infectives (From admission, onward)    Start     Ordered Stop   08/08/21 0900  azithromycin (ZITHROMAX) 500 mg in sodium  chloride 0.9 % 250 mL IVPB  Status:  Discontinued        08/07/21 0956 08/08/21 2005   08/08/21 0800  cefTRIAXone (ROCEPHIN) 2 g in sodium chloride 0.9 % 100 mL IVPB  Status:  Discontinued        08/07/21 0956 08/08/21 2005   08/07/21 0715  cefTRIAXone (ROCEPHIN) 2 g in sodium chloride 0.9 % 100 mL IVPB        08/07/21 0705 08/07/21 0818   08/07/21 0715  azithromycin (ZITHROMAX) 500 mg in sodium chloride 0.9 % 250 mL IVPB  Status:  Discontinued        08/07/21 0705 08/07/21 0956         Discharge Exam: Vitals:   08/09/21 2103 08/10/21 0609 08/10/21 0845 08/10/21 1010  BP: (!) 154/70 (!) 156/85  (!) 168/81  Pulse: (!) 101 94    Resp: 17 17    Temp:  97.8 F (36.6 C) 98.1 F (36.7 C)    TempSrc: Oral Oral    SpO2: 94% 96% 93%   Weight:      Height:        General: A/O x4, No acute respiratory distress Eyes: negative scleral hemorrhage, negative anisocoria, negative icterus ENT: Negative Runny nose, negative gingival bleeding, Neck:  Negative scars, masses, torticollis, lymphadenopathy, JVD Lungs: decreased breath sounds bilateral (improved),  without wheezes or crackles  Discharge Instructions   Allergies as of 08/10/2021       Reactions   Demerol [meperidine] Nausea And Vomiting, Other (See Comments)   Made the patient "violently sick"   Zocor [simvastatin] Nausea And Vomiting, Other (See Comments)   Made him very jittery, also   Beet [beta Vulgaris] Nausea And Vomiting   Liver Nausea And Vomiting        Medication List     STOP taking these medications    pantoprazole 40 MG tablet Commonly known as: PROTONIX       TAKE these medications    albuterol 108 (90 Base) MCG/ACT inhaler Commonly known as: VENTOLIN HFA Inhale 2 puffs into the lungs every 6 (six) hours as needed for wheezing. What changed: Another medication with the same name was changed. Make sure you understand how and when to take each.   albuterol (2.5 MG/3ML) 0.083% nebulizer solution Commonly known as: PROVENTIL Inhale 3 mLs (2.5 mg total) by nebulization every 4 (four) hours as needed for wheezing or shortness of breath. What changed: when to take this   apixaban 5 MG Tabs tablet Commonly known as: ELIQUIS Take 5 mg by mouth 2 (two) times daily.   aspirin EC 81 MG tablet Take 81 mg by mouth daily.   b complex vitamins capsule Take 1 capsule by mouth daily.   buPROPion 300 MG 24 hr tablet Commonly known as: WELLBUTRIN XL Take 300 mg by mouth daily.   busPIRone 15 MG tablet Commonly known as: BUSPAR Take 15 mg by mouth daily.   calcium-vitamin D 500-200 MG-UNIT tablet Commonly known as: OSCAL WITH D Take 1 tablet by mouth 2  (two) times daily with a meal.   carboxymethylcellulose 0.5 % Soln Commonly known as: REFRESH PLUS Place 1 drop into both eyes 4 (four) times daily as needed (dry eyes).   clobetasol ointment 0.05 % Commonly known as: TEMOVATE Apply 1 application. topically 2 (two) times daily as needed (for scars).   clotrimazole-betamethasone cream Commonly known as: LOTRISONE Apply 1 application. topically 2 (two) times daily as needed (fungus).   cycloSPORINE 0.05 %  ophthalmic emulsion Commonly known as: RESTASIS Place 1 drop into both eyes 2 (two) times daily.   diltiazem 180 MG 24 hr capsule Commonly known as: CARDIZEM CD Take 180 mg by mouth daily.   DULoxetine 60 MG capsule Commonly known as: CYMBALTA Take 1 capsule (60 mg total) by mouth 2 (two) times daily.   feeding supplement Liqd Take 237 mLs by mouth 3 (three) times daily between meals. What changed: when to take this   fluticasone 50 MCG/ACT nasal spray Commonly known as: FLONASE Place 2 sprays into both nostrils daily.   folic acid 1 MG tablet Commonly known as: FOLVITE Take 1 mg by mouth daily.   gabapentin 300 MG capsule Commonly known as: NEURONTIN Take 1 capsule (300 mg total) by mouth 2 (two) times daily.   GARLIC OIL PO Take 1 capsule by mouth daily.   guaiFENesin 600 MG 12 hr tablet Commonly known as: MUCINEX Take 1 tablet (600 mg total) by mouth 2 (two) times daily as needed for cough.   lurasidone 40 MG Tabs tablet Commonly known as: LATUDA Take 40 mg by mouth daily after supper.   melatonin 3 MG Tabs tablet Take 6 mg by mouth at bedtime.   metFORMIN 500 MG tablet Commonly known as: GLUCOPHAGE Take 500 mg by mouth 2 (two) times daily with a meal.   methocarbamol 500 MG tablet Commonly known as: ROBAXIN Take 1 tablet (500 mg total) by mouth every 8 (eight) hours as needed for muscle spasms.   Mometasone Furoate 200 MCG/ACT Aero Inhale 2 puffs into the lungs at bedtime.   mycophenolate 250 MG  capsule Commonly known as: CELLCEPT Take 1,500 mg by mouth 2 (two) times daily.   nitroGLYCERIN 0.4 MG SL tablet Commonly known as: NITROSTAT Place 1 tablet (0.4 mg total) under the tongue every 5 (five) minutes as needed for chest pain.   OVER THE COUNTER MEDICATION Take 1 tablet by mouth at bedtime. Medication: Pure ZZZs sleep and destress   oxcarbazepine 600 MG tablet Commonly known as: TRILEPTAL Take 600-900 mg by mouth See admin instructions. Take 662m by mouth during the day, then take  900 mg by mouth at bedtime   oxyCODONE-acetaminophen 5-325 MG tablet Commonly known as: PERCOCET/ROXICET Take 1 tablet by mouth every 6 (six) hours as needed for severe pain.   OxyCONTIN 20 mg 12 hr tablet Generic drug: oxyCODONE Take 20 mg by mouth every 12 (twelve) hours.   OXYGEN Inhale 3 L into the lungs at bedtime.   polyethylene glycol 17 g packet Commonly known as: MIRALAX / GLYCOLAX Take 17 g by mouth daily.   PRESCRIPTION MEDICATION Inhale into the lungs See admin instructions. CPAP- At bedtime and during during any naps   senna-docusate 8.6-50 MG tablet Commonly known as: Senokot-S Take 1 tablet by mouth 2 (two) times daily.   sodium chloride 0.65 % Soln nasal spray Commonly known as: OCEAN Place 2 sprays into both nostrils 4 (four) times daily as needed for congestion.   Striverdi Respimat 2.5 MCG/ACT Aers Generic drug: Olodaterol HCl Inhale 2 puffs into the lungs daily.   SYSTANE OP Place 1 drop into both eyes daily as needed (dry eye).   tamsulosin 0.4 MG Caps capsule Commonly known as: FLOMAX Take 0.4 mg by mouth daily.   traMADol 50 MG tablet Commonly known as: ULTRAM Take 2 tablets (100 mg total) by mouth at bedtime.   traZODone 50 MG tablet Commonly known as: DESYREL Take 75 mg by mouth at bedtime.  vitamin C 500 MG tablet Commonly known as: ASCORBIC ACID Take 500 mg by mouth 2 (two) times daily.   Vitamin D-3 25 MCG (1000 UT) Caps Take 1,000  Units by mouth 2 (two) times daily.       Allergies  Allergen Reactions   Demerol [Meperidine] Nausea And Vomiting and Other (See Comments)    Made the patient "violently sick"   Zocor [Simvastatin] Nausea And Vomiting and Other (See Comments)    Made him very jittery, also   Beet [Beta Vulgaris] Nausea And Vomiting   Liver Nausea And Vomiting      The results of significant diagnostics from this hospitalization (including imaging, microbiology, ancillary and laboratory) are listed below for reference.    Significant Diagnostic Studies: DG Chest Port 1 View  Result Date: 08/07/2021 CLINICAL DATA:  Left-sided rib pain for 2 months. Painful with movement. Wrist sent rib fracture. EXAM: PORTABLE CHEST 1 VIEW COMPARISON:  CT angio chest 08/03/2021 FINDINGS: There is a left-sided chest tube in place with tip terminating along the left heart border. No pneumothorax identified. Loculated left pleural effusion appears decreased in volume from the previous exam. Large bulla within the left base is in the projection of the loculated left pleural effusion. Decreased aeration to the left lower lobe is unchanged compatible with atelectasis and airspace consolidation. New airspace opacities within the right lower lung are identified. IMPRESSION: 1. Decrease in volume of loculated left pleural effusion status post chest tube placement. 2. Persistent atelectasis and airspace consolidation involving the left lower lobe. 3. New airspace consolidation within the right lower lung. Electronically Signed   By: Kerby Moors M.D.   On: 08/07/2021 06:54   IR PERC PLEURAL DRAIN W/INDWELL CATH W/IMG GUIDE  Result Date: 08/04/2021 INDICATION: Recurrent malignant left pleural effusion, lung cancer EXAM: ULTRASOUND AND FLUOROSCOPIC LEFT CHEST TUNNELED PLEURAL DRAIN (PLEURX CATHETER) MEDICATIONS: 2 G ANCEF IV WITHIN 1 HOUR OF THE PROCEDURE ANESTHESIA/SEDATION: Moderate (conscious) sedation was employed during this  procedure. A total of Versed 1.5 mg and Fentanyl 100 mcg was administered intravenously by the radiology nurse. Total intra-service moderate Sedation Time: 16 minutes. The patient's level of consciousness and vital signs were monitored continuously by radiology nursing throughout the procedure under my direct supervision. COMPLICATIONS: None immediate. PROCEDURE: Informed written consent was obtained from the patient after a thorough discussion of the procedural risks, benefits and alternatives. All questions were addressed. Maximal Sterile Barrier Technique was utilized including caps, mask, sterile gowns, sterile gloves, sterile drape, hand hygiene and skin antiseptic. A timeout was performed prior to the initiation of the procedure. Previous imaging reviewed. Patient positioned right side down decubitus. Preliminary ultrasound performed. The left effusion was localized and marked in the mid axillary line through a lower intercostal space. Under sterile conditions and local anesthesia, the percutaneous access needle was advanced under direct ultrasound through the lower intercostal space into the pleural fluid. Needle position confirmed with ultrasound. Images obtained for documentation. There was return of pleural fluid. Amplatz guidewire inserted into the pleural space. Under sterile conditions and local anesthesia, a subcutaneous tunnel was created in the adjacent anterior chest soft tissues. PleurX catheter was tunneled subcutaneously to the entry site and advanced into the pleural space through the peel-away sheath. Position again confirmed with ultrasound and fluoroscopy. Following insertion, 1.2 L thoracentesis performed. Catheter secured with Prolene suture. Entry site closed with subcuticular Vicryl suture and Dermabond. Sterile dressing applied. No immediate complication. Patient tolerated the procedure well. IMPRESSION: Successful ultrasound and fluoroscopic  left chest tunneled pleural drain (PleurX  catheter. 1.2 L thoracentesis performed as well. Electronically Signed   By: Jerilynn Mages.  Shick M.D.   On: 08/04/2021 17:22   CT Angio Chest PE W and/or Wo Contrast  Result Date: 08/03/2021 CLINICAL DATA:  Pulmonary embolism (PE) suspected, high prob Worsening shortness of breath over the last 4 days. Left-sided chest pain. Patient with history of lung cancer. EXAM: CT ANGIOGRAPHY CHEST WITH CONTRAST TECHNIQUE: Multidetector CT imaging of the chest was performed using the standard protocol during bolus administration of intravenous contrast. Multiplanar CT image reconstructions and MIPs were obtained to evaluate the vascular anatomy. RADIATION DOSE REDUCTION: This exam was performed according to the departmental dose-optimization program which includes automated exposure control, adjustment of the mA and/or kV according to patient size and/or use of iterative reconstruction technique. CONTRAST:  84m OMNIPAQUE IOHEXOL 350 MG/ML SOLN COMPARISON:  Radiograph earlier today.  Chest CT 06/23/2021 FINDINGS: Cardiovascular: There are no filling defects within the pulmonary arteries to suggest pulmonary embolus. Portions of the left lower lobe subsegmental branches are attenuated and not well assessed. Additionally there is motion artifact obscuring the bases. Atherosclerosis of the thoracic aorta. No dissection or acute aortic findings. Coronary artery calcifications. The heart is normal in size. No pericardial effusion. Mediastinum/Nodes: Loculated pleural fluid tracks along the left aspect of the mediastinum. No enlarged mediastinal or hilar lymph nodes. No axillary or supraclavicular adenopathy. Lungs/Pleura: Partially loculated left pleural effusion has progressed in size from CT 6 weeks ago, now moderate to large. Loculated component tracks anteriorly at the lung base and medially adjacent to the mediastinum. A bulla at the left lung base measures 6.4 cm, decreased in size from prior exam when it measured 8 cm, and is  elevated related to adjacent pleural effusion. There is associated airspace disease and/or atelectasis in the left lower and left upper lobe. Loculated fluid tracks into the inter lobar fissure on the left. Areas of pleural enhancement in the basilar hemithorax, series 5, image 123 and 128. Underlying emphysema. Bulla at the right lung base are unchanged. No acute right lung airspace disease. No right pleural effusion. Suspected basilar linear atelectasis, although obscured by motion. Upper Abdomen: Limited assessment of the upper abdomen demonstrates no acute findings. Musculoskeletal: There is a nondisplaced fracture of the left anterior seventh rib. No evidence of underlying lesion. Thoracic spondylosis. Review of the MIP images confirms the above findings. IMPRESSION: 1. No pulmonary embolus. 2. Moderate to large partially loculated left pleural effusion has progressed in size from CT 6 weeks ago. Loculated component tracks anteriorly at the lung base and medially adjacent to the mediastinum. There is associated airspace disease and/or atelectasis in the left lower and left upper lobe. A bulla at the left lung base has decreased in size from prior exam, and is elevated related to adjacent pleural fluid. Areas of pleural enhancement in the basilar hemithorax raises concern for underlying pleural based metastatic disease. 3. Nondisplaced left anterior seventh rib fracture. 4. Emphysema.  Stable size of right basilar bulla. 5. Aortic atherosclerosis.  Coronary artery calcifications. Aortic Atherosclerosis (ICD10-I70.0) and Emphysema (ICD10-J43.9). Electronically Signed   By: MKeith RakeM.D.   On: 08/03/2021 15:24   DG Chest 2 View  Result Date: 08/03/2021 CLINICAL DATA:  Shortness of breath. EXAM: CHEST - 2 VIEW COMPARISON:  07/04/2021 FINDINGS: Asymmetric elevation left hemidiaphragm with left base collapse/consolidation and probable effusion. Bleb at the left base is stable in the interval. The cardio  pericardial silhouette is enlarged. Telemetry  leads overlie the chest. IMPRESSION: Asymmetric elevation left hemidiaphragm with left base collapse/consolidation and probable effusion. Electronically Signed   By: Misty Stanley M.D.   On: 08/03/2021 11:49    Microbiology: Recent Results (from the past 240 hour(s))  Resp Panel by RT-PCR (Flu A&B, Covid) Anterior Nasal Swab     Status: None   Collection Time: 08/03/21  1:33 PM   Specimen: Anterior Nasal Swab  Result Value Ref Range Status   SARS Coronavirus 2 by RT PCR NEGATIVE NEGATIVE Final    Comment: (NOTE) SARS-CoV-2 target nucleic acids are NOT DETECTED.  The SARS-CoV-2 RNA is generally detectable in upper respiratory specimens during the acute phase of infection. The lowest concentration of SARS-CoV-2 viral copies this assay can detect is 138 copies/mL. A negative result does not preclude SARS-Cov-2 infection and should not be used as the sole basis for treatment or other patient management decisions. A negative result may occur with  improper specimen collection/handling, submission of specimen other than nasopharyngeal swab, presence of viral mutation(s) within the areas targeted by this assay, and inadequate number of viral copies(<138 copies/mL). A negative result must be combined with clinical observations, patient history, and epidemiological information. The expected result is Negative.  Fact Sheet for Patients:  EntrepreneurPulse.com.au  Fact Sheet for Healthcare Providers:  IncredibleEmployment.be  This test is no t yet approved or cleared by the [place] FDA and  has been authorized for detection and/or diagnosis of SARS-CoV-2 by FDA under an Emergency Use Authorization (EUA). This EUA will remain  in effect (meaning this test can be used) for the duration of the COVID-19 declaration under Section 564(b)(1) of the Act, 21 U.S.C.section 360bbb-3(b)(1), unless the authorization is  terminated  or revoked sooner.       Influenza A by PCR NEGATIVE NEGATIVE Final   Influenza B by PCR NEGATIVE NEGATIVE Final    Comment: (NOTE) The Xpert Xpress SARS-CoV-2/FLU/RSV plus assay is intended as an aid in the diagnosis of influenza from Nasopharyngeal swab specimens and should not be used as a sole basis for treatment. Nasal washings and aspirates are unacceptable for Xpert Xpress SARS-CoV-2/FLU/RSV testing.  Fact Sheet for Patients: EntrepreneurPulse.com.au  Fact Sheet for Healthcare Providers: IncredibleEmployment.be  This test is not yet approved or cleared by the [place] FDA and has been authorized for detection and/or diagnosis of SARS-CoV-2 by FDA under an Emergency Use Authorization (EUA). This EUA will remain in effect (meaning this test can be used) for the duration of the COVID-19 declaration under Section 564(b)(1) of the Act, 21 U.S.C. section 360bbb-3(b)(1), unless the authorization is terminated or revoked.  Performed at [facility], [place] [address] [place], [place] [address]   Culture, blood (Routine X 2) w Reflex to ID Panel     Status: None (Preliminary result)   Collection Time: 08/07/21 10:30 AM   Specimen: BLOOD  Result Value Ref Range Status   Specimen Description   Final    BLOOD LEFT ANTECUBITAL Performed at [facility] [address], [place], [place] [address]    Special Requests   Final    BOTTLES DRAWN AEROBIC AND ANAEROBIC Blood Culture results may not be optimal due to an inadequate volume of blood received in culture bottles Performed at [facility] [address], [place], [place] [address]    Culture   Final    NO GROWTH 2 DAYS Performed at [facility] [address], [place], [place] [address]  Report Status PENDING  Incomplete  Respiratory (~20 pathogens) panel by PCR     Status: None   Collection Time: 08/07/21  10:53 AM   Specimen: Respiratory  Result Value Ref Range Status   Adenovirus NOT DETECTED NOT DETECTED Final   Coronavirus 229E NOT DETECTED NOT DETECTED Final    Comment: (NOTE) The Coronavirus on the Respiratory Panel, DOES NOT test for the novel  Coronavirus (2019 nCoV)    Coronavirus HKU1 NOT DETECTED NOT DETECTED Final   Coronavirus NL63 NOT DETECTED NOT DETECTED Final   Coronavirus OC43 NOT DETECTED NOT DETECTED Final   Metapneumovirus NOT DETECTED NOT DETECTED Final   Rhinovirus / Enterovirus NOT DETECTED NOT DETECTED Final   Influenza A NOT DETECTED NOT DETECTED Final   Influenza B NOT DETECTED NOT DETECTED Final   Parainfluenza Virus 1 NOT DETECTED NOT DETECTED Final   Parainfluenza Virus 2 NOT DETECTED NOT DETECTED Final   Parainfluenza Virus 3 NOT DETECTED NOT DETECTED Final   Parainfluenza Virus 4 NOT DETECTED NOT DETECTED Final   Respiratory Syncytial Virus NOT DETECTED NOT DETECTED Final   Bordetella pertussis NOT DETECTED NOT DETECTED Final   Bordetella Parapertussis NOT DETECTED NOT DETECTED Final   Chlamydophila pneumoniae NOT DETECTED NOT DETECTED Final   Mycoplasma pneumoniae NOT DETECTED NOT DETECTED Final    Comment: Performed at [facility], [place]. [address] [place],  [address]  Resp Panel by RT-PCR (Flu A&B, Covid)     Status: None   Collection Time: 08/07/21 10:53 AM   Specimen: Nasal Swab  Result Value Ref Range Status   SARS Coronavirus 2 by RT PCR NEGATIVE NEGATIVE Final    Comment: (NOTE) SARS-CoV-2 target nucleic acids are NOT DETECTED.  The SARS-CoV-2 RNA is generally detectable in upper respiratory specimens during the acute phase of infection. The lowest concentration of SARS-CoV-2 viral copies this assay can detect is 138 copies/mL. A negative result does not preclude SARS-Cov-2 infection and should not be used as the sole basis for treatment or other patient management decisions. A negative result may occur with  improper specimen  collection/handling, submission of specimen other than nasopharyngeal swab, presence of viral mutation(s) within the areas targeted by this assay, and inadequate number of viral copies(<138 copies/mL). A negative result must be combined with clinical observations, patient history, and epidemiological information. The expected result is Negative.  Fact Sheet for Patients:  EntrepreneurPulse.com.au  Fact Sheet for Healthcare Providers:  IncredibleEmployment.be  This test is no t yet approved or cleared by the [place] FDA and  has been authorized for detection and/or diagnosis of SARS-CoV-2 by FDA under an Emergency Use Authorization (EUA). This EUA will remain  in effect (meaning this test can be used) for the duration of the COVID-19 declaration under Section 564(b)(1) of the Act, 21 U.S.C.section 360bbb-3(b)(1), unless the authorization is terminated  or revoked sooner.       Influenza A by PCR NEGATIVE NEGATIVE Final   Influenza B by PCR NEGATIVE NEGATIVE Final    Comment: (NOTE) The Xpert Xpress SARS-CoV-2/FLU/RSV plus assay is intended as an aid in the diagnosis of influenza from Nasopharyngeal swab specimens and should not be used as a sole basis for treatment. Nasal washings and aspirates are unacceptable for Xpert Xpress SARS-CoV-2/FLU/RSV testing.  Fact Sheet for Patients: EntrepreneurPulse.com.au  Fact Sheet for Healthcare Providers: IncredibleEmployment.be  This test is not yet approved or cleared by the [place] FDA and has been authorized for detection and/or diagnosis of SARS-CoV-2 by  FDA under an Emergency Use Authorization (EUA). This EUA will remain in effect (meaning this test can be used) for the duration of the COVID-19 declaration under Section 564(b)(1) of the Act, 21 U.S.C. section 360bbb-3(b)(1), unless the authorization is terminated or revoked.  Performed at [facility], [place] [address], [place], [place] [address]   Culture, blood (Routine X 2) w Reflex to ID Panel     Status: None (Preliminary result)   Collection Time: 08/07/21  6:22 PM   Specimen: BLOOD  Result Value Ref Range Status   Specimen Description   Final    BLOOD RIGHT ANTECUBITAL Performed at [facility] [address], [place], [place] [address]    Special Requests   Final    BOTTLES DRAWN AEROBIC ONLY Blood Culture adequate volume Performed at [facility] [address] [place], [place] [address]    Culture   Final    NO GROWTH 2 DAYS Performed at [facility] [address], [place], [place] [address]    Report Status PENDING  Incomplete     Labs: Basic Metabolic Panel: Recent Labs  Lab 08/03/21 1228 08/03/21 1422 08/04/21 0512 08/05/21 0543 08/07/21 0604 08/07/21 1036 08/08/21 0723 08/09/21 0328 08/10/21 0346  NA 133*  --  136 136 134*  --  138  --   --   K 4.7  --  4.3 4.3 4.5  --  4.6  --   --   CL 101  --  102 101 100  --  104  --   --   CO2 22  --  '24 28 27  ' --  28  --   --   GLUCOSE 142*  --  134* 124* 105*  --  149*  --   --   BUN 9  --  '11 13 15  ' --  13  --   --   CREATININE 0.88  --  0.86 0.97 0.94 0.85 0.73  --   --   CALCIUM 8.9  --  9.2 8.8* 9.1  --  8.6*  --   --   MG  --  2.3  --  2.1  --   --  2.2 2.0 1.9  PHOS  --   --   --  4.1  --   --  3.4 2.4* 2.7   Liver Function Tests: Recent Labs  Lab 08/03/21 1422 08/05/21 0543 08/07/21 0604 08/08/21 0723  AST 33 14* 16 12*  ALT '18 13 14 11  ' ALKPHOS 118 94 93 72  BILITOT 1.0 0.4 0.5 0.2*  PROT 7.4 6.4* 6.7 6.0*  ALBUMIN 3.9 3.3* 3.5 2.8*   No results for input(s): "LIPASE", "AMYLASE" in the last 168 hours. No results for input(s): "AMMONIA" in the last 168 hours. CBC: Recent Labs  Lab 08/05/21 0543 08/07/21 0604 08/07/21 1036 08/08/21 0723 08/09/21 0328 08/10/21 0346  WBC 10.6* 11.7* 10.3 11.7* 15.4* 15.9*   NEUTROABS 8.4*  --   --  9.8* 14.4* 14.7*  HGB 11.0* 11.4* 10.6* 9.9* 9.9* 10.2*  HCT 35.0* 34.9* 33.3* 31.1* 32.0* 32.0*  MCV 95.6 92.8 94.6 93.1 95.0 92.5  PLT 366 361 343 359 410* 421*   Cardiac Enzymes: No results for input(s): "CKTOTAL", "CKMB", "CKMBINDEX", "TROPONINI" in the last 168 hours. BNP: BNP (last 3 results) Recent Labs    05/21/21 2104 06/03/21 2335 06/14/21 0628  BNP 31.6 29.6 23.3    ProBNP (last 3 results)  No results for input(s): "PROBNP" in the last 8760 hours.  CBG: No results for input(s): "GLUCAP" in the last 168 hours.     Signed:  Dia Crawford, MD [facility]

## 2021-08-10 NOTE — TOC Transition Note (Addendum)
Transition of Care [facility]) - CM/SW Discharge Note   Patient Details  Name: ADONIS YIM MRN: [id number] Date of Birth: April 23, 1950  [facility]) CM/SW Contact:  Leeroy Cha, RN Phone Number: 08/10/2021, 1:03 PM   Clinical Narrative:     Dcd to home with hhc RN pt and aide through [place].  Crystal with [facility] aware of dc and to resume hhc. HAS HOME O2 IN PLACE.  Ptar called at 1351 to transport to home.   Barriers to Discharge: Barriers Resolved   Patient Goals and CMS Choice Patient states their goals for this hospitalization and ongoing recovery are:: to go home CMS Medicare.gov Compare Post Acute Care list provided to:: Patient Choice offered to / list presented to : Patient  Discharge Placement                       Discharge Plan and Services   Discharge Planning Services: CM Consult              DME Agency: Other - Comment       HH Arranged: PT, OT HH Agency: [place] Date [place]: 08/10/21 Time HH Agency Contacted: 1300 Representative spoke with at [place]: [place]) Interventions     Readmission Risk Interventions    08/05/2021   11:02 AM 06/07/2021    4:15 PM  Readmission Risk Prevention Plan  Transportation Screening Complete Complete  Medication Review Press photographer) Complete Complete  PCP or Specialist appointment within 3-5 days of discharge Complete Complete  HRI or [place] Complete Complete  SW Recovery Care/Counseling Consult Complete Complete  Palliative Care Screening Not Applicable Not [place] Not Applicable Not Applicable

## 2021-08-11 NOTE — Telephone Encounter (Signed)
Patient would like to file the [employer], but we do not have an authorization. Contacted the [place] and they will need the patient to call his PCP or pulmonary doctor at the [place] to authorize a referral to our office. Spoke with patient and informed him of this information, patient denied scheduling an appointment at this time, he would like to wait until the authorization is in place.

## 2021-08-12 LAB — CULTURE, BLOOD (ROUTINE X 2)
Culture: NO GROWTH
Culture: NO GROWTH
Special Requests: ADEQUATE

## 2021-08-12 LAB — BORDETELLA PERTUSSIS PCR
B parapertussis, DNA: NEGATIVE
B pertussis, DNA: NEGATIVE

## 2021-08-17 NOTE — Telephone Encounter (Signed)
Left voicemail with Claiborne Billings to check on the status of authorization.

## 2021-08-19 ENCOUNTER — Inpatient Hospital Stay: Payer: No Typology Code available for payment source

## 2021-08-19 ENCOUNTER — Emergency Department (HOSPITAL_COMMUNITY): Payer: No Typology Code available for payment source

## 2021-08-19 ENCOUNTER — Other Ambulatory Visit: Payer: Self-pay

## 2021-08-19 ENCOUNTER — Inpatient Hospital Stay: Payer: No Typology Code available for payment source | Admitting: Physician Assistant

## 2021-08-19 ENCOUNTER — Encounter (HOSPITAL_COMMUNITY): Payer: Self-pay | Admitting: Emergency Medicine

## 2021-08-19 ENCOUNTER — Emergency Department (HOSPITAL_COMMUNITY)
Admission: EM | Admit: 2021-08-19 | Discharge: 2021-08-20 | Disposition: A | Discharge status: Home / Self Care | Payer: No Typology Code available for payment source | Attending: Emergency Medicine | Admitting: Emergency Medicine

## 2021-08-19 DIAGNOSIS — N179 Acute kidney failure, unspecified: Secondary | ICD-10-CM | POA: Diagnosis not present

## 2021-08-19 DIAGNOSIS — Z87891 Personal history of nicotine dependence: Secondary | ICD-10-CM | POA: Diagnosis not present

## 2021-08-19 DIAGNOSIS — Z7951 Long term (current) use of inhaled steroids: Secondary | ICD-10-CM | POA: Diagnosis not present

## 2021-08-19 DIAGNOSIS — J449 Chronic obstructive pulmonary disease, unspecified: Secondary | ICD-10-CM | POA: Insufficient documentation

## 2021-08-19 DIAGNOSIS — I10 Essential (primary) hypertension: Secondary | ICD-10-CM | POA: Insufficient documentation

## 2021-08-19 DIAGNOSIS — R0602 Shortness of breath: Secondary | ICD-10-CM | POA: Diagnosis present

## 2021-08-19 DIAGNOSIS — Z85118 Personal history of other malignant neoplasm of bronchus and lung: Secondary | ICD-10-CM | POA: Diagnosis not present

## 2021-08-19 DIAGNOSIS — Z7982 Long term (current) use of aspirin: Secondary | ICD-10-CM | POA: Insufficient documentation

## 2021-08-19 DIAGNOSIS — E871 Hypo-osmolality and hyponatremia: Secondary | ICD-10-CM | POA: Diagnosis not present

## 2021-08-19 DIAGNOSIS — Z79899 Other long term (current) drug therapy: Secondary | ICD-10-CM | POA: Diagnosis not present

## 2021-08-19 DIAGNOSIS — C3492 Malignant neoplasm of unspecified part of left bronchus or lung: Secondary | ICD-10-CM

## 2021-08-19 DIAGNOSIS — Z7901 Long term (current) use of anticoagulants: Secondary | ICD-10-CM | POA: Diagnosis not present

## 2021-08-19 NOTE — ED Provider Notes (Incomplete)
[facility] DEPT Provider Note: Georgena Spurling, MD, FACEP  CSN: [id number] MRN: [id number] ARRIVAL: 08/19/21 at 2242 ROOM: WA09/WA09   CHIEF COMPLAINT  Shortness of Breath   HISTORY OF PRESENT ILLNESS  08/19/21 10:59 PM Mario [name] is a 70 y.o. male with a history of lung cancer and COPD.  He has a thoracentesis catheter present in his left lower chest.  He also has a rib fracture on the left which intermittently causes him pain.  Several hours ago he experienced an increase in shortness of breath, possibly due to exacerbation of his rib pain, that was associated with lightheadedness.  He is normally on oxygen at 4 L by nasal cannula.  EMS found him to be 71% on his usual 4 L by nasal cannula and he was placed on a nonrebreather with improvement in his oxygen saturation to 91%.  EMS notes that he had about 25 to 30 feet of tubing from his oxygen concentrator to his nasal cannula.  On arrival here he is back on his 4 L by nasal cannula with an oxygen saturation of 93%.  His lightheadedness has resolved.  He is on oxycodone for his rib pain which has provided partial relief.   Past Medical History:  Diagnosis Date   Anxiety    Bronchitis    COPD (chronic obstructive pulmonary disease) ([place])    Depression    History of radiation therapy    Left lung- 10/05/20-10/15/20- Dr. Gery Pray   Hypertension    lung ca 09/2020   MI (myocardial infarction) [facility])    ????   OSA (obstructive sleep apnea)    Suicide attempt ([place])    Tension pneumothorax 06/27/2016   Uveitis     Past Surgical History:  Procedure Laterality Date   BIOPSY  07/03/2021   Procedure: BIOPSY;  Surgeon: Otis Brace, MD;  Location: [facility];  Service: Gastroenterology;;   BRONCHIAL BIOPSY  07/30/2020   Procedure: BRONCHIAL BIOPSIES;  Surgeon: Garner Nash, DO;  Location: [facility];  Service: Pulmonary;;   BRONCHIAL BRUSHINGS  07/30/2020   Procedure: BRONCHIAL BRUSHINGS;  Surgeon: Garner Nash,  DO;  Location: [facility];  Service: Pulmonary;;   BRONCHIAL NEEDLE ASPIRATION BIOPSY  07/30/2020   Procedure: BRONCHIAL NEEDLE ASPIRATION BIOPSIES;  Surgeon: Garner Nash, DO;  Location: [facility];  Service: Pulmonary;;   BRONCHIAL WASHINGS  07/30/2020   Procedure: BRONCHIAL WASHINGS;  Surgeon: Garner Nash, DO;  Location: [facility];  Service: Pulmonary;;   CHEST TUBE INSERTION Left 06/27/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     ESOPHAGOGASTRODUODENOSCOPY N/A 07/03/2021   Procedure: ESOPHAGOGASTRODUODENOSCOPY (EGD);  Surgeon: Otis Brace, MD;  Location: [name];  Service: Gastroenterology;  Laterality: N/A;   IR PERC PLEURAL DRAIN W/INDWELL CATH W/IMG GUIDE  08/04/2021   SKIN CANCER EXCISION     VIDEO BRONCHOSCOPY WITH ENDOBRONCHIAL NAVIGATION Left 07/30/2020   Procedure: VIDEO BRONCHOSCOPY WITH ENDOBRONCHIAL NAVIGATION;  Surgeon: Garner Nash, DO;  Location: [facility];  Service: Pulmonary;  Laterality: Left;    Family History  Problem Relation Age of Onset   Dementia Father     Social History   Tobacco Use   Smoking status: Former    Packs/day: 1.00    Years: 35.00    Total pack years: 35.00    Types: Cigarettes    Quit date: 05/2016    Years since quitting: 5.2   Smokeless tobacco: Never  Vaping Use   Vaping Use: Never used  Substance Use Topics  Alcohol use: No    Alcohol/week: 0.0 standard drinks of alcohol    Comment: denies use of any drugs or alcohol   Drug use: No    Prior to Admission medications   Medication Sig Start Date End Date Taking? Authorizing Provider  albuterol (PROVENTIL) (2.5 MG/3ML) 0.083% nebulizer solution Inhale 3 mLs (2.5 mg total) by nebulization every 4 (four) hours as needed for wheezing or shortness of breath. Patient taking differently: Take 2.5 mg by nebulization every 6 (six) hours as needed for wheezing or shortness of breath. 08/04/20 08/07/21  Lavina Hamman, MD  albuterol (VENTOLIN HFA) 108 (90 Base) MCG/ACT inhaler Inhale 2  puffs into the lungs every 6 (six) hours as needed for wheezing. 06/02/20   [provider]  apixaban (ELIQUIS) 5 MG TABS tablet Take 5 mg by mouth 2 (two) times daily.    [provider]  aspirin 81 MG EC tablet Take 81 mg by mouth daily.    [provider]  b complex vitamins capsule Take 1 capsule by mouth daily.    [provider]  buPROPion (WELLBUTRIN XL) 300 MG 24 hr tablet Take 300 mg by mouth daily. 02/05/20   [provider]  busPIRone (BUSPAR) 15 MG tablet Take 15 mg by mouth daily.    [provider]  calcium-vitamin D (OSCAL WITH D) 500-200 MG-UNIT tablet Take 1 tablet by mouth 2 (two) times daily with a meal.    [provider]  carboxymethylcellulose (REFRESH PLUS) 0.5 % SOLN Place 1 drop into both eyes 4 (four) times daily as needed (dry eyes).    [provider]  Cholecalciferol (VITAMIN D-3) 25 MCG (1000 UT) CAPS Take 1,000 Units by mouth 2 (two) times daily.    [provider]  clobetasol ointment (TEMOVATE) 0.93 % Apply 1 application. topically 2 (two) times daily as needed (for scars).    [provider]  clotrimazole-betamethasone (LOTRISONE) cream Apply 1 application. topically 2 (two) times daily as needed (fungus). 07/06/21   Aline August, MD  cycloSPORINE (RESTASIS) 0.05 % ophthalmic emulsion Place 1 drop into both eyes 2 (two) times daily.    [provider]  diltiazem (CARDIZEM CD) 180 MG 24 hr capsule Take 180 mg by mouth daily.    [provider]  DULoxetine (CYMBALTA) 60 MG capsule Take 1 capsule (60 mg total) by mouth 2 (two) times daily. 07/21/17   Lavina Hamman, MD  feeding supplement (ENSURE ENLIVE / ENSURE PLUS) LIQD Take 237 mLs by mouth 3 (three) times daily between meals. Patient taking differently: Take 1 Bottle by mouth daily. 08/04/20   Lavina Hamman, MD  fluticasone Asencion Islam) 50 MCG/ACT nasal spray Place 2 sprays into both nostrils daily.     [provider]  folic acid (FOLVITE) 1 MG tablet Take 1 mg by mouth daily.    [provider]  gabapentin (NEURONTIN) 300 MG capsule Take 1 capsule (300 mg total) by mouth 2 (two) times daily. 07/06/21   Aline August, MD  GARLIC OIL PO Take 1 capsule by mouth daily.    [provider]  guaiFENesin (MUCINEX) 600 MG 12 hr tablet Take 1 tablet (600 mg total) by mouth 2 (two) times daily as needed for cough. 07/06/21   Aline August, MD  lurasidone (LATUDA) 40 MG TABS tablet Take 40 mg by mouth daily after supper.    [provider]  Melatonin 3 MG TABS Take 6 mg by mouth at bedtime.  [provider]  metFORMIN (GLUCOPHAGE) 500 MG tablet Take 500 mg by mouth 2 (two) times daily with a meal.    [provider]  methocarbamol (ROBAXIN) 500 MG tablet Take 1 tablet (500 mg total) by mouth every 8 (eight) hours as needed for muscle spasms. 07/06/21   Aline August, MD  Mometasone Furoate 200 MCG/ACT AERO Inhale 2 puffs into the lungs at bedtime.    [provider]  mycophenolate (CELLCEPT) 250 MG capsule Take 1,500 mg by mouth 2 (two) times daily.    [provider]  nitroGLYCERIN (NITROSTAT) 0.4 MG SL tablet Place 1 tablet (0.4 mg total) under the tongue every 5 (five) minutes as needed for chest pain. 07/03/19   Danford, Suann Larry, MD  Olodaterol HCl (STRIVERDI RESPIMAT) 2.5 MCG/ACT AERS Inhale 2 puffs into the lungs daily.    [provider]  OVER THE COUNTER MEDICATION Take 1 tablet by mouth at bedtime. Medication: Pure ZZZs sleep and destress    [provider]  oxcarbazepine (TRILEPTAL) 600 MG tablet Take 600-900 mg by mouth See admin instructions. Take 600mg  by mouth during the day, then take  900 mg by mouth at bedtime 07/16/20   [provider]  oxyCODONE (OXYCONTIN) 20 mg 12 hr tablet Take 20 mg by mouth every 12 (twelve) hours.    [provider]  oxyCODONE-acetaminophen (PERCOCET/ROXICET) 5-325 MG  tablet Take 1 tablet by mouth every 6 (six) hours as needed for severe pain. Patient not taking: Reported on 07/20/2021 07/06/21   Aline August, MD  OXYGEN Inhale 3 L into the lungs at bedtime.    [provider]  Polyethyl Glycol-Propyl Glycol (SYSTANE OP) Place 1 drop into both eyes daily as needed (dry eye).    [provider]  polyethylene glycol (MIRALAX / GLYCOLAX) 17 g packet Take 17 g by mouth daily.    [provider]  PRESCRIPTION MEDICATION Inhale into the lungs See admin instructions. CPAP- At bedtime and during during any naps    [provider]  senna-docusate (SENOKOT-S) 8.6-50 MG tablet Take 1 tablet by mouth 2 (two) times daily. 07/06/21   Aline August, MD  sodium chloride (OCEAN) 0.65 % SOLN nasal spray Place 2 sprays into both nostrils 4 (four) times daily as needed for congestion.    [provider]  tamsulosin (FLOMAX) 0.4 MG CAPS capsule Take 0.4 mg by mouth daily.    [provider]  traMADol (ULTRAM) 50 MG tablet Take 2 tablets (100 mg total) by mouth at bedtime. 07/06/21   Aline August, MD  traZODone (DESYREL) 50 MG tablet Take 75 mg by mouth at bedtime.    [provider]  vitamin C (ASCORBIC ACID) 500 MG tablet Take 500 mg by mouth 2 (two) times daily.    [provider]    Allergies Demerol [meperidine], Zocor [simvastatin], Beet [beta vulgaris], and Liver   REVIEW OF SYSTEMS  Negative except as noted here or in the History of Present Illness.   PHYSICAL EXAMINATION  Initial Vital Signs Blood pressure 100/60, pulse 87, temperature 98.1 F (36.7 C), temperature source Oral, resp. rate 15, SpO2 90 %.  Examination General: Well-developed, high BMI male in no acute distress; appearance consistent with age of record HENT: normocephalic; atraumatic Eyes: pupils equal, round and reactive to light; extraocular muscles intact Neck: supple Heart: regular rate and rhythm Lungs: clear to  auscultation bilaterally Chest: Thoracentesis port left lateral lower chest Abdomen: soft; nondistended; nontender; bowel sounds present Extremities:  No deformity; full range of motion; trace edema of lower legs Neurologic: Awake, alert and oriented; motor function intact in all extremities and symmetric; no facial droop Skin: Warm and dry Psychiatric: Normal mood and affect   RESULTS  Summary of this visit's results, reviewed and interpreted by myself:   EKG Interpretation  Date/Time:    Ventricular Rate:    PR Interval:    QRS Duration:   QT Interval:    QTC Calculation:   R Axis:     Text Interpretation:         Laboratory Studies: No results found for this or any previous visit (from the past 24 hour(s)). Imaging Studies: No results found.  ED COURSE and MDM  Nursing notes, initial and subsequent vitals signs, including pulse oximetry, reviewed and interpreted by myself.  Vitals:   08/19/21 2255  BP: 100/60  Pulse: 87  Resp: 15  Temp: 98.1 F (36.7 C)  TempSrc: Oral  SpO2: 90%   Medications - No data to display    PROCEDURES  Procedures   ED DIAGNOSES  No diagnosis found.

## 2021-08-19 NOTE — ED Triage Notes (Signed)
Pt BIB EMS from home for SOB and dizziness. Pt normally on 4L at home w/ hx of lung cancer and COPD. Upon arrival pt was stating to 71% at home with 4L. Per EMS a non rebreather was placed and O2 stats stayed at 91%.   91% 20 rr 85 hr 242 systolic palpated

## 2021-08-20 ENCOUNTER — Emergency Department (HOSPITAL_COMMUNITY): Payer: No Typology Code available for payment source

## 2021-08-20 ENCOUNTER — Encounter (HOSPITAL_COMMUNITY): Payer: Self-pay

## 2021-08-20 ENCOUNTER — Inpatient Hospital Stay (HOSPITAL_COMMUNITY)
Admission: EM | Admit: 2021-08-20 | Discharge: 2021-08-23 | DRG: 682 | Disposition: A | Discharge status: Home Health | Payer: No Typology Code available for payment source | Attending: Internal Medicine | Admitting: Internal Medicine

## 2021-08-20 ENCOUNTER — Telehealth: Payer: Self-pay | Admitting: Internal Medicine

## 2021-08-20 DIAGNOSIS — F32A Depression, unspecified: Secondary | ICD-10-CM | POA: Diagnosis present

## 2021-08-20 DIAGNOSIS — Z888 Allergy status to other drugs, medicaments and biological substances status: Secondary | ICD-10-CM

## 2021-08-20 DIAGNOSIS — C3492 Malignant neoplasm of unspecified part of left bronchus or lung: Secondary | ICD-10-CM | POA: Diagnosis present

## 2021-08-20 DIAGNOSIS — R0602 Shortness of breath: Secondary | ICD-10-CM | POA: Diagnosis not present

## 2021-08-20 DIAGNOSIS — N179 Acute kidney failure, unspecified: Secondary | ICD-10-CM | POA: Diagnosis not present

## 2021-08-20 DIAGNOSIS — Z7189 Other specified counseling: Secondary | ICD-10-CM | POA: Diagnosis not present

## 2021-08-20 DIAGNOSIS — Z7982 Long term (current) use of aspirin: Secondary | ICD-10-CM

## 2021-08-20 DIAGNOSIS — Z515 Encounter for palliative care: Secondary | ICD-10-CM | POA: Diagnosis not present

## 2021-08-20 DIAGNOSIS — G8929 Other chronic pain: Secondary | ICD-10-CM | POA: Diagnosis present

## 2021-08-20 DIAGNOSIS — Z923 Personal history of irradiation: Secondary | ICD-10-CM | POA: Diagnosis not present

## 2021-08-20 DIAGNOSIS — Z91018 Allergy to other foods: Secondary | ICD-10-CM | POA: Diagnosis not present

## 2021-08-20 DIAGNOSIS — Z87891 Personal history of nicotine dependence: Secondary | ICD-10-CM | POA: Diagnosis not present

## 2021-08-20 DIAGNOSIS — D649 Anemia, unspecified: Secondary | ICD-10-CM | POA: Diagnosis present

## 2021-08-20 DIAGNOSIS — Z79899 Other long term (current) drug therapy: Secondary | ICD-10-CM

## 2021-08-20 DIAGNOSIS — Z7984 Long term (current) use of oral hypoglycemic drugs: Secondary | ICD-10-CM

## 2021-08-20 DIAGNOSIS — I252 Old myocardial infarction: Secondary | ICD-10-CM | POA: Diagnosis not present

## 2021-08-20 DIAGNOSIS — Z66 Do not resuscitate: Secondary | ICD-10-CM | POA: Diagnosis not present

## 2021-08-20 DIAGNOSIS — Z885 Allergy status to narcotic agent status: Secondary | ICD-10-CM

## 2021-08-20 DIAGNOSIS — E119 Type 2 diabetes mellitus without complications: Secondary | ICD-10-CM

## 2021-08-20 DIAGNOSIS — J9611 Chronic respiratory failure with hypoxia: Secondary | ICD-10-CM | POA: Diagnosis not present

## 2021-08-20 DIAGNOSIS — Z91014 Allergy to mammalian meats: Secondary | ICD-10-CM | POA: Diagnosis not present

## 2021-08-20 DIAGNOSIS — J9621 Acute and chronic respiratory failure with hypoxia: Secondary | ICD-10-CM | POA: Diagnosis present

## 2021-08-20 DIAGNOSIS — F419 Anxiety disorder, unspecified: Secondary | ICD-10-CM | POA: Diagnosis present

## 2021-08-20 DIAGNOSIS — F332 Major depressive disorder, recurrent severe without psychotic features: Secondary | ICD-10-CM | POA: Diagnosis not present

## 2021-08-20 DIAGNOSIS — E871 Hypo-osmolality and hyponatremia: Secondary | ICD-10-CM | POA: Diagnosis present

## 2021-08-20 DIAGNOSIS — J189 Pneumonia, unspecified organism: Secondary | ICD-10-CM | POA: Diagnosis not present

## 2021-08-20 DIAGNOSIS — F4325 Adjustment disorder with mixed disturbance of emotions and conduct: Secondary | ICD-10-CM | POA: Diagnosis present

## 2021-08-20 DIAGNOSIS — I10 Essential (primary) hypertension: Secondary | ICD-10-CM | POA: Diagnosis present

## 2021-08-20 DIAGNOSIS — F331 Major depressive disorder, recurrent, moderate: Secondary | ICD-10-CM | POA: Diagnosis not present

## 2021-08-20 DIAGNOSIS — J90 Pleural effusion, not elsewhere classified: Secondary | ICD-10-CM | POA: Diagnosis not present

## 2021-08-20 DIAGNOSIS — J432 Centrilobular emphysema: Secondary | ICD-10-CM | POA: Diagnosis not present

## 2021-08-20 DIAGNOSIS — S2242XD Multiple fractures of ribs, left side, subsequent encounter for fracture with routine healing: Secondary | ICD-10-CM | POA: Diagnosis not present

## 2021-08-20 DIAGNOSIS — R0781 Pleurodynia: Secondary | ICD-10-CM | POA: Diagnosis not present

## 2021-08-20 DIAGNOSIS — D75839 Thrombocytosis, unspecified: Secondary | ICD-10-CM | POA: Diagnosis present

## 2021-08-20 DIAGNOSIS — J910 Malignant pleural effusion: Secondary | ICD-10-CM | POA: Diagnosis present

## 2021-08-20 DIAGNOSIS — W19XXXA Unspecified fall, initial encounter: Principal | ICD-10-CM

## 2021-08-20 DIAGNOSIS — J449 Chronic obstructive pulmonary disease, unspecified: Secondary | ICD-10-CM | POA: Diagnosis not present

## 2021-08-20 DIAGNOSIS — Z85828 Personal history of other malignant neoplasm of skin: Secondary | ICD-10-CM

## 2021-08-20 DIAGNOSIS — E875 Hyperkalemia: Secondary | ICD-10-CM | POA: Diagnosis present

## 2021-08-20 DIAGNOSIS — E860 Dehydration: Secondary | ICD-10-CM | POA: Diagnosis present

## 2021-08-20 DIAGNOSIS — Z86711 Personal history of pulmonary embolism: Secondary | ICD-10-CM | POA: Diagnosis not present

## 2021-08-20 DIAGNOSIS — I776 Arteritis, unspecified: Secondary | ICD-10-CM | POA: Diagnosis present

## 2021-08-20 DIAGNOSIS — Z7901 Long term (current) use of anticoagulants: Secondary | ICD-10-CM

## 2021-08-20 DIAGNOSIS — G4733 Obstructive sleep apnea (adult) (pediatric): Secondary | ICD-10-CM | POA: Diagnosis not present

## 2021-08-20 DIAGNOSIS — C799 Secondary malignant neoplasm of unspecified site: Secondary | ICD-10-CM | POA: Diagnosis not present

## 2021-08-20 LAB — CBC WITH DIFFERENTIAL/PLATELET
Abs Immature Granulocytes: 0.08 10*3/uL — ABNORMAL HIGH (ref 0.00–0.07)
Abs Immature Granulocytes: 0.11 10*3/uL — ABNORMAL HIGH (ref 0.00–0.07)
Basophils Absolute: 0.1 10*3/uL (ref 0.0–0.1)
Basophils Absolute: 0.1 10*3/uL (ref 0.0–0.1)
Basophils Relative: 0 %
Basophils Relative: 0 %
Eosinophils Absolute: 0.1 10*3/uL (ref 0.0–0.5)
Eosinophils Absolute: 0.2 10*3/uL (ref 0.0–0.5)
Eosinophils Relative: 1 %
Eosinophils Relative: 1 %
HCT: 33.4 % — ABNORMAL LOW (ref 39.0–52.0)
HCT: 37.2 % — ABNORMAL LOW (ref 39.0–52.0)
Hemoglobin: 10.8 g/dL — ABNORMAL LOW (ref 13.0–17.0)
Hemoglobin: 11.8 g/dL — ABNORMAL LOW (ref 13.0–17.0)
Immature Granulocytes: 1 %
Immature Granulocytes: 1 %
Lymphocytes Relative: 6 %
Lymphocytes Relative: 8 %
Lymphs Abs: 1.1 10*3/uL (ref 0.7–4.0)
Lymphs Abs: 1.3 10*3/uL (ref 0.7–4.0)
MCH: 29.1 pg (ref 26.0–34.0)
MCH: 29.2 pg (ref 26.0–34.0)
MCHC: 31.7 g/dL (ref 30.0–36.0)
MCHC: 32.3 g/dL (ref 30.0–36.0)
MCV: 90.3 fL (ref 80.0–100.0)
MCV: 91.6 fL (ref 80.0–100.0)
Monocytes Absolute: 1.4 10*3/uL — ABNORMAL HIGH (ref 0.1–1.0)
Monocytes Absolute: 1.4 10*3/uL — ABNORMAL HIGH (ref 0.1–1.0)
Monocytes Relative: 7 %
Monocytes Relative: 8 %
Neutro Abs: 13.8 10*3/uL — ABNORMAL HIGH (ref 1.7–7.7)
Neutro Abs: 15.9 10*3/uL — ABNORMAL HIGH (ref 1.7–7.7)
Neutrophils Relative %: 82 %
Neutrophils Relative %: 85 %
Platelets: 549 10*3/uL — ABNORMAL HIGH (ref 150–400)
Platelets: 617 10*3/uL — ABNORMAL HIGH (ref 150–400)
RBC: 3.7 MIL/uL — ABNORMAL LOW (ref 4.22–5.81)
RBC: 4.06 MIL/uL — ABNORMAL LOW (ref 4.22–5.81)
RDW: 15 % (ref 11.5–15.5)
RDW: 15 % (ref 11.5–15.5)
WBC: 16.9 10*3/uL — ABNORMAL HIGH (ref 4.0–10.5)
WBC: 18.7 10*3/uL — ABNORMAL HIGH (ref 4.0–10.5)
nRBC: 0 % (ref 0.0–0.2)
nRBC: 0 % (ref 0.0–0.2)

## 2021-08-20 LAB — PROTIME-INR
INR: 1.1 (ref 0.8–1.2)
Prothrombin Time: 14.1 seconds (ref 11.4–15.2)

## 2021-08-20 LAB — COMPREHENSIVE METABOLIC PANEL
ALT: 14 U/L (ref 0–44)
AST: 32 U/L (ref 15–41)
Albumin: 3.1 g/dL — ABNORMAL LOW (ref 3.5–5.0)
Alkaline Phosphatase: 80 U/L (ref 38–126)
Anion gap: 12 (ref 5–15)
BUN: 23 mg/dL (ref 8–23)
CO2: 21 mmol/L — ABNORMAL LOW (ref 22–32)
Calcium: 9.7 mg/dL (ref 8.9–10.3)
Chloride: 95 mmol/L — ABNORMAL LOW (ref 98–111)
Creatinine, Ser: 1.9 mg/dL — ABNORMAL HIGH (ref 0.61–1.24)
GFR, Estimated: 38 mL/min — ABNORMAL LOW (ref >60)
Glucose, Bld: 126 mg/dL — ABNORMAL HIGH (ref 70–99)
Potassium: 5.4 mmol/L — ABNORMAL HIGH (ref 3.5–5.1)
Sodium: 128 mmol/L — ABNORMAL LOW (ref 135–145)
Total Bilirubin: 0.6 mg/dL (ref 0.3–1.2)
Total Protein: 6 g/dL — ABNORMAL LOW (ref 6.5–8.1)

## 2021-08-20 LAB — LACTIC ACID, PLASMA
Lactic Acid, Venous: 1.3 mmol/L (ref 0.5–1.9)
Lactic Acid, Venous: 0.9 mmol/L (ref 0.5–1.9)

## 2021-08-20 LAB — BASIC METABOLIC PANEL
Anion gap: 9 (ref 5–15)
BUN: 18 mg/dL (ref 8–23)
CO2: 25 mmol/L (ref 22–32)
Calcium: 9.9 mg/dL (ref 8.9–10.3)
Chloride: 101 mmol/L (ref 98–111)
Creatinine, Ser: 1.66 mg/dL — ABNORMAL HIGH (ref 0.61–1.24)
GFR, Estimated: 44 mL/min — ABNORMAL LOW (ref >60)
Glucose, Bld: 165 mg/dL — ABNORMAL HIGH (ref 70–99)
Potassium: 5.4 mmol/L — ABNORMAL HIGH (ref 3.5–5.1)
Sodium: 135 mmol/L (ref 135–145)

## 2021-08-20 LAB — BRAIN NATRIURETIC PEPTIDE: B Natriuretic Peptide: 22 pg/mL (ref 0.0–100.0)

## 2021-08-20 LAB — TROPONIN I (HIGH SENSITIVITY)
Troponin I (High Sensitivity): 23 ng/L — ABNORMAL HIGH (ref <18)
Troponin I (High Sensitivity): 25 ng/L — ABNORMAL HIGH (ref <18)

## 2021-08-20 MED ORDER — VANCOMYCIN HCL 10 G IV SOLR
2500.0000 mg | Freq: Once | INTRAVENOUS | Status: AC
  Administered 2021-08-20: 2500 mg via INTRAVENOUS
  Filled 2021-08-20: qty 2500

## 2021-08-20 MED ORDER — LACTATED RINGERS IV BOLUS
500.0000 mL | Freq: Once | INTRAVENOUS | Status: AC
  Administered 2021-08-20: 500 mL via INTRAVENOUS

## 2021-08-20 MED ORDER — VANCOMYCIN VARIABLE DOSE PER UNSTABLE RENAL FUNCTION (PHARMACIST DOSING): Status: DC

## 2021-08-20 MED ORDER — SODIUM CHLORIDE 0.9 % IV SOLN
2.0000 g | Freq: Once | INTRAVENOUS | Status: AC
Start: 2021-08-20 — End: 2021-08-20
  Administered 2021-08-20: 2 g via INTRAVENOUS
  Filled 2021-08-20: qty 12.5

## 2021-08-20 NOTE — ED Triage Notes (Signed)
Pt BIB EMS after being found in the hallway of his residence after a fall. Patient states that he felt weak and fell, but did catch himself and then slowly slid himself down the wall. Patient states he did hit his head. Patient has had multiple falls in the past couple of days, with him hitting his head most of the time. Pt is on elequis.   VSS w/ EMS.

## 2021-08-20 NOTE — ED Notes (Signed)
PTAR called to arrange pt transport home.

## 2021-08-21 ENCOUNTER — Inpatient Hospital Stay (HOSPITAL_COMMUNITY): Payer: No Typology Code available for payment source

## 2021-08-21 DIAGNOSIS — N179 Acute kidney failure, unspecified: Secondary | ICD-10-CM | POA: Diagnosis not present

## 2021-08-21 LAB — CBC
HCT: 33.3 % — ABNORMAL LOW (ref 39.0–52.0)
Hemoglobin: 10.9 g/dL — ABNORMAL LOW (ref 13.0–17.0)
MCH: 29 pg (ref 26.0–34.0)
MCHC: 32.7 g/dL (ref 30.0–36.0)
MCV: 88.6 fL (ref 80.0–100.0)
Platelets: 518 10*3/uL — ABNORMAL HIGH (ref 150–400)
RBC: 3.76 MIL/uL — ABNORMAL LOW (ref 4.22–5.81)
RDW: 14.9 % (ref 11.5–15.5)
WBC: 17.5 10*3/uL — ABNORMAL HIGH (ref 4.0–10.5)
nRBC: 0 % (ref 0.0–0.2)

## 2021-08-21 LAB — BASIC METABOLIC PANEL
Anion gap: 11 (ref 5–15)
Anion gap: 12 (ref 5–15)
Anion gap: 8 (ref 5–15)
Anion gap: 9 (ref 5–15)
BUN: 18 mg/dL (ref 8–23)
BUN: 17 mg/dL (ref 8–23)
BUN: 15 mg/dL (ref 8–23)
BUN: 14 mg/dL (ref 8–23)
CO2: 22 mmol/L (ref 22–32)
CO2: 20 mmol/L — ABNORMAL LOW (ref 22–32)
CO2: 23 mmol/L (ref 22–32)
CO2: 24 mmol/L (ref 22–32)
Calcium: 9.2 mg/dL (ref 8.9–10.3)
Calcium: 9.1 mg/dL (ref 8.9–10.3)
Calcium: 8.5 mg/dL — ABNORMAL LOW (ref 8.9–10.3)
Calcium: 8.1 mg/dL — ABNORMAL LOW (ref 8.9–10.3)
Chloride: 98 mmol/L (ref 98–111)
Chloride: 100 mmol/L (ref 98–111)
Chloride: 101 mmol/L (ref 98–111)
Chloride: 100 mmol/L (ref 98–111)
Creatinine, Ser: 1.61 mg/dL — ABNORMAL HIGH (ref 0.61–1.24)
Creatinine, Ser: 1.23 mg/dL (ref 0.61–1.24)
Creatinine, Ser: 1.1 mg/dL (ref 0.61–1.24)
Creatinine, Ser: 1.01 mg/dL (ref 0.61–1.24)
GFR, Estimated: 46 mL/min — ABNORMAL LOW (ref >60)
GFR, Estimated: >60 mL/min (ref >60)
GFR, Estimated: >60 mL/min (ref >60)
GFR, Estimated: >60 mL/min (ref >60)
Glucose, Bld: 137 mg/dL — ABNORMAL HIGH (ref 70–99)
Glucose, Bld: 160 mg/dL — ABNORMAL HIGH (ref 70–99)
Glucose, Bld: 131 mg/dL — ABNORMAL HIGH (ref 70–99)
Glucose, Bld: 129 mg/dL — ABNORMAL HIGH (ref 70–99)
Potassium: 4.8 mmol/L (ref 3.5–5.1)
Potassium: 5.2 mmol/L — ABNORMAL HIGH (ref 3.5–5.1)
Potassium: 4.7 mmol/L (ref 3.5–5.1)
Potassium: 4.3 mmol/L (ref 3.5–5.1)
Sodium: 131 mmol/L — ABNORMAL LOW (ref 135–145)
Sodium: 132 mmol/L — ABNORMAL LOW (ref 135–145)
Sodium: 132 mmol/L — ABNORMAL LOW (ref 135–145)
Sodium: 133 mmol/L — ABNORMAL LOW (ref 135–145)

## 2021-08-21 LAB — URINALYSIS, COMPLETE (UACMP) WITH MICROSCOPIC
Bacteria, UA: NONE SEEN
Bilirubin Urine: NEGATIVE
Glucose, UA: NEGATIVE mg/dL
Hgb urine dipstick: NEGATIVE
Ketones, ur: NEGATIVE mg/dL
Leukocytes,Ua: NEGATIVE
Nitrite: NEGATIVE
Protein, ur: NEGATIVE mg/dL
Specific Gravity, Urine: 1.017 (ref 1.005–1.030)
pH: 5 (ref 5.0–8.0)

## 2021-08-21 LAB — PROCALCITONIN: Procalcitonin: <0.1 ng/mL

## 2021-08-21 LAB — SODIUM, URINE, RANDOM: Sodium, Ur: <10 mmol/L

## 2021-08-21 LAB — BRAIN NATRIURETIC PEPTIDE: B Natriuretic Peptide: 30 pg/mL (ref 0.0–100.0)

## 2021-08-21 LAB — CREATININE, URINE, RANDOM: Creatinine, Urine: 162.45 mg/dL

## 2021-08-21 LAB — MAGNESIUM: Magnesium: 2.2 mg/dL (ref 1.7–2.4)

## 2021-08-21 MED ORDER — FOLIC ACID 1 MG PO TABS
1.0000 mg | ORAL_TABLET | Freq: Every day | ORAL | Status: DC
  Administered 2021-08-21 – 2021-08-23 (×3): 1 mg via ORAL
  Filled 2021-08-21 (×3): qty 1

## 2021-08-21 MED ORDER — DILTIAZEM HCL ER COATED BEADS 180 MG PO CP24
180.0000 mg | ORAL_CAPSULE | Freq: Every day | ORAL | Status: DC
  Administered 2021-08-21 – 2021-08-23 (×3): 180 mg via ORAL
  Filled 2021-08-21 (×3): qty 1

## 2021-08-21 MED ORDER — POLYVINYL ALCOHOL 1.4 % OP SOLN
1.0000 [drp] | Freq: Four times a day (QID) | OPHTHALMIC | Status: DC | PRN
  Administered 2021-08-23: 1 [drp] via OPHTHALMIC
  Filled 2021-08-21: qty 15

## 2021-08-21 MED ORDER — BUPROPION HCL ER (XL) 150 MG PO TB24
300.0000 mg | ORAL_TABLET | Freq: Every day | ORAL | Status: DC
  Administered 2021-08-21 – 2021-08-23 (×3): 300 mg via ORAL
  Filled 2021-08-21 (×3): qty 2

## 2021-08-21 MED ORDER — DULOXETINE HCL 60 MG PO CPEP
60.0000 mg | ORAL_CAPSULE | Freq: Two times a day (BID) | ORAL | Status: DC
  Administered 2021-08-21 – 2021-08-23 (×6): 60 mg via ORAL
  Filled 2021-08-21 (×5): qty 1
  Filled 2021-08-21: qty 2

## 2021-08-21 MED ORDER — ARFORMOTEROL TARTRATE 15 MCG/2ML IN NEBU: 15.0000 ug | INHALATION_SOLUTION | Freq: Two times a day (BID) | RESPIRATORY_TRACT | Status: DC

## 2021-08-21 MED ORDER — SENNOSIDES-DOCUSATE SODIUM 8.6-50 MG PO TABS: 1.0000 | ORAL_TABLET | Freq: Every evening | ORAL | Status: DC | PRN

## 2021-08-21 MED ORDER — SALINE SPRAY 0.65 % NA SOLN: 2.0000 | Freq: Four times a day (QID) | NASAL | Status: DC | PRN

## 2021-08-21 MED ORDER — SODIUM ZIRCONIUM CYCLOSILICATE 10 G PO PACK
10.0000 g | PACK | Freq: Two times a day (BID) | ORAL | Status: AC
Start: 2021-08-21 — End: 2021-08-21
  Administered 2021-08-21 (×2): 10 g via ORAL
  Filled 2021-08-21 (×2): qty 1

## 2021-08-21 MED ORDER — TRAZODONE HCL 50 MG PO TABS
75.0000 mg | ORAL_TABLET | Freq: Every day | ORAL | Status: DC
  Administered 2021-08-21 – 2021-08-22 (×3): 75 mg via ORAL
  Filled 2021-08-21 (×3): qty 2

## 2021-08-21 MED ORDER — ALBUTEROL SULFATE (2.5 MG/3ML) 0.083% IN NEBU
2.5000 mg | INHALATION_SOLUTION | Freq: Four times a day (QID) | RESPIRATORY_TRACT | Status: DC | PRN
Start: 2021-08-21 — End: 2021-08-21

## 2021-08-21 MED ORDER — BUDESONIDE 0.5 MG/2ML IN SUSP
0.5000 mg | Freq: Two times a day (BID) | RESPIRATORY_TRACT | Status: DC
  Administered 2021-08-21 – 2021-08-23 (×4): 0.5 mg via RESPIRATORY_TRACT
  Filled 2021-08-21 (×5): qty 2

## 2021-08-21 MED ORDER — IPRATROPIUM-ALBUTEROL 0.5-2.5 (3) MG/3ML IN SOLN
3.0000 mL | Freq: Three times a day (TID) | RESPIRATORY_TRACT | Status: DC
  Administered 2021-08-21: 3 mL via RESPIRATORY_TRACT
  Filled 2021-08-21: qty 3

## 2021-08-21 MED ORDER — OXCARBAZEPINE 300 MG PO TABS
600.0000 mg | ORAL_TABLET | Freq: Every day | ORAL | Status: DC
  Administered 2021-08-21 – 2021-08-23 (×3): 600 mg via ORAL
  Filled 2021-08-21 (×4): qty 2

## 2021-08-21 MED ORDER — ASPIRIN 81 MG PO TBEC
81.0000 mg | DELAYED_RELEASE_TABLET | Freq: Every day | ORAL | Status: DC
  Administered 2021-08-21 – 2021-08-23 (×3): 81 mg via ORAL
  Filled 2021-08-21 (×3): qty 1

## 2021-08-21 MED ORDER — CYCLOSPORINE 0.05 % OP EMUL
1.0000 [drp] | Freq: Two times a day (BID) | OPHTHALMIC | Status: DC
  Administered 2021-08-21 – 2021-08-23 (×5): 1 [drp] via OPHTHALMIC
  Filled 2021-08-21 (×6): qty 30

## 2021-08-21 MED ORDER — MOMETASONE FUROATE 200 MCG/ACT IN AERO: 2.0000 | INHALATION_SPRAY | Freq: Every day | RESPIRATORY_TRACT | Status: DC

## 2021-08-21 MED ORDER — ACETAMINOPHEN 325 MG PO TABS
650.0000 mg | ORAL_TABLET | Freq: Four times a day (QID) | ORAL | Status: DC | PRN
  Administered 2021-08-21 – 2021-08-23 (×4): 650 mg via ORAL
  Filled 2021-08-21 (×4): qty 2

## 2021-08-21 MED ORDER — BUSPIRONE HCL 5 MG PO TABS
15.0000 mg | ORAL_TABLET | Freq: Every day | ORAL | Status: DC
  Administered 2021-08-21 – 2021-08-23 (×3): 15 mg via ORAL
  Filled 2021-08-21 (×3): qty 3

## 2021-08-21 MED ORDER — APIXABAN 5 MG PO TABS
5.0000 mg | ORAL_TABLET | Freq: Two times a day (BID) | ORAL | Status: DC
  Administered 2021-08-21 – 2021-08-23 (×5): 5 mg via ORAL
  Filled 2021-08-21 (×5): qty 1

## 2021-08-21 MED ORDER — OXCARBAZEPINE 300 MG PO TABS: 600.0000 mg | ORAL_TABLET | ORAL | Status: DC

## 2021-08-21 MED ORDER — MYCOPHENOLATE MOFETIL 250 MG PO CAPS
1500.0000 mg | ORAL_CAPSULE | Freq: Two times a day (BID) | ORAL | Status: DC
Start: 2021-08-21 — End: 2021-08-23
  Administered 2021-08-21 – 2021-08-23 (×6): 1500 mg via ORAL
  Filled 2021-08-21 (×6): qty 6

## 2021-08-21 MED ORDER — OXYCODONE HCL 5 MG PO TABS
5.0000 mg | ORAL_TABLET | Freq: Three times a day (TID) | ORAL | Status: DC | PRN
  Administered 2021-08-21 – 2021-08-23 (×5): 5 mg via ORAL
  Filled 2021-08-21 (×5): qty 1

## 2021-08-21 MED ORDER — ACETAMINOPHEN 650 MG RE SUPP: 650.0000 mg | Freq: Four times a day (QID) | RECTAL | Status: DC | PRN

## 2021-08-21 MED ORDER — OXYCODONE HCL ER 10 MG PO T12A
20.0000 mg | EXTENDED_RELEASE_TABLET | Freq: Two times a day (BID) | ORAL | Status: DC
  Administered 2021-08-21 – 2021-08-23 (×6): 20 mg via ORAL
  Filled 2021-08-21 (×6): qty 2

## 2021-08-21 MED ORDER — GABAPENTIN 300 MG PO CAPS: 300.0000 mg | ORAL_CAPSULE | Freq: Two times a day (BID) | ORAL | Status: DC

## 2021-08-21 MED ORDER — SODIUM CHLORIDE 0.9 % IV SOLN
1.0000 g | Freq: Every day | INTRAVENOUS | Status: DC
  Administered 2021-08-21 – 2021-08-22 (×3): 1 g via INTRAVENOUS
  Filled 2021-08-21 (×4): qty 10

## 2021-08-21 MED ORDER — SODIUM CHLORIDE 0.9% FLUSH
3.0000 mL | Freq: Two times a day (BID) | INTRAVENOUS | Status: DC
  Administered 2021-08-21 – 2021-08-23 (×5): 3 mL via INTRAVENOUS

## 2021-08-21 MED ORDER — SODIUM CHLORIDE 0.9 % IV SOLN: INTRAVENOUS | Status: AC

## 2021-08-21 MED ORDER — GUAIFENESIN ER 600 MG PO TB12
600.0000 mg | ORAL_TABLET | Freq: Two times a day (BID) | ORAL | Status: DC | PRN
Start: 2021-08-21 — End: 2021-08-23

## 2021-08-21 MED ORDER — LURASIDONE HCL 40 MG PO TABS
40.0000 mg | ORAL_TABLET | Freq: Every day | ORAL | Status: DC
  Administered 2021-08-21 – 2021-08-22 (×2): 40 mg via ORAL
  Filled 2021-08-21 (×3): qty 1

## 2021-08-21 MED ORDER — PREDNISONE 20 MG PO TABS
40.0000 mg | ORAL_TABLET | Freq: Every day | ORAL | Status: DC
  Administered 2021-08-21 – 2021-08-23 (×3): 40 mg via ORAL
  Filled 2021-08-21 (×4): qty 2

## 2021-08-21 MED ORDER — ALBUTEROL SULFATE (2.5 MG/3ML) 0.083% IN NEBU: 2.5000 mg | INHALATION_SOLUTION | RESPIRATORY_TRACT | Status: DC | PRN

## 2021-08-21 MED ORDER — IPRATROPIUM-ALBUTEROL 0.5-2.5 (3) MG/3ML IN SOLN
3.0000 mL | Freq: Two times a day (BID) | RESPIRATORY_TRACT | Status: DC
  Administered 2021-08-22: 3 mL via RESPIRATORY_TRACT
  Filled 2021-08-21: qty 3
  Filled 2021-08-21: qty 42

## 2021-08-21 MED ORDER — TAMSULOSIN HCL 0.4 MG PO CAPS
0.4000 mg | ORAL_CAPSULE | Freq: Every day | ORAL | Status: DC
  Administered 2021-08-21 – 2021-08-23 (×3): 0.4 mg via ORAL
  Filled 2021-08-21 (×3): qty 1

## 2021-08-21 MED ORDER — GABAPENTIN 100 MG PO CAPS
100.0000 mg | ORAL_CAPSULE | Freq: Two times a day (BID) | ORAL | Status: DC
  Administered 2021-08-21 – 2021-08-23 (×5): 100 mg via ORAL
  Filled 2021-08-21 (×5): qty 1

## 2021-08-21 MED ORDER — OXCARBAZEPINE 300 MG PO TABS
900.0000 mg | ORAL_TABLET | Freq: Every day | ORAL | Status: DC
  Administered 2021-08-21 – 2021-08-22 (×3): 900 mg via ORAL
  Filled 2021-08-21 (×4): qty 3

## 2021-08-21 NOTE — Progress Notes (Signed)
Unable to start purple man, verbal report obtained.

## 2021-08-22 ENCOUNTER — Inpatient Hospital Stay (HOSPITAL_COMMUNITY): Payer: No Typology Code available for payment source

## 2021-08-22 DIAGNOSIS — N179 Acute kidney failure, unspecified: Secondary | ICD-10-CM | POA: Diagnosis not present

## 2021-08-22 LAB — CBC WITH DIFFERENTIAL/PLATELET
Abs Immature Granulocytes: 0.06 10*3/uL (ref 0.00–0.07)
Basophils Absolute: 0 10*3/uL (ref 0.0–0.1)
Basophils Relative: 0 %
Eosinophils Absolute: 0.2 10*3/uL (ref 0.0–0.5)
Eosinophils Relative: 2 %
HCT: 29.7 % — ABNORMAL LOW (ref 39.0–52.0)
Hemoglobin: 9.6 g/dL — ABNORMAL LOW (ref 13.0–17.0)
Immature Granulocytes: 1 %
Lymphocytes Relative: 12 %
Lymphs Abs: 1.4 10*3/uL (ref 0.7–4.0)
MCH: 29.3 pg (ref 26.0–34.0)
MCHC: 32.3 g/dL (ref 30.0–36.0)
MCV: 90.5 fL (ref 80.0–100.0)
Monocytes Absolute: 0.9 10*3/uL (ref 0.1–1.0)
Monocytes Relative: 8 %
Neutro Abs: 8.8 10*3/uL — ABNORMAL HIGH (ref 1.7–7.7)
Neutrophils Relative %: 77 %
Platelets: 445 10*3/uL — ABNORMAL HIGH (ref 150–400)
RBC: 3.28 MIL/uL — ABNORMAL LOW (ref 4.22–5.81)
RDW: 14.7 % (ref 11.5–15.5)
WBC: 11.4 10*3/uL — ABNORMAL HIGH (ref 4.0–10.5)
nRBC: 0 % (ref 0.0–0.2)

## 2021-08-22 LAB — COMPREHENSIVE METABOLIC PANEL
ALT: 17 U/L (ref 0–44)
AST: 24 U/L (ref 15–41)
Albumin: 2.5 g/dL — ABNORMAL LOW (ref 3.5–5.0)
Alkaline Phosphatase: 62 U/L (ref 38–126)
Anion gap: 7 (ref 5–15)
BUN: 14 mg/dL (ref 8–23)
CO2: 28 mmol/L (ref 22–32)
Calcium: 8.2 mg/dL — ABNORMAL LOW (ref 8.9–10.3)
Chloride: 101 mmol/L (ref 98–111)
Creatinine, Ser: 1.01 mg/dL (ref 0.61–1.24)
GFR, Estimated: >60 mL/min (ref >60)
Glucose, Bld: 159 mg/dL — ABNORMAL HIGH (ref 70–99)
Potassium: 4.4 mmol/L (ref 3.5–5.1)
Sodium: 136 mmol/L (ref 135–145)
Total Bilirubin: 0.2 mg/dL — ABNORMAL LOW (ref 0.3–1.2)
Total Protein: 5.4 g/dL — ABNORMAL LOW (ref 6.5–8.1)

## 2021-08-22 LAB — URINE CULTURE: Culture: NO GROWTH

## 2021-08-22 LAB — BRAIN NATRIURETIC PEPTIDE: B Natriuretic Peptide: 48.7 pg/mL (ref 0.0–100.0)

## 2021-08-22 LAB — PROCALCITONIN: Procalcitonin: <0.1 ng/mL

## 2021-08-22 LAB — MAGNESIUM: Magnesium: 2.2 mg/dL (ref 1.7–2.4)

## 2021-08-22 MED ORDER — TRAMADOL HCL 50 MG PO TABS
50.0000 mg | ORAL_TABLET | Freq: Four times a day (QID) | ORAL | Status: DC | PRN
  Administered 2021-08-22 – 2021-08-23 (×4): 50 mg via ORAL
  Filled 2021-08-22 (×4): qty 1

## 2021-08-22 NOTE — Progress Notes (Signed)
RT NOTE:  Pt refuses CPAP for tonight. He stated he does not like any of the mask that we carry. RT told him that he can have a family member bring his mask from home. Pt understands RT is available if he changes his mind.

## 2021-08-23 ENCOUNTER — Inpatient Hospital Stay: Payer: No Typology Code available for payment source | Attending: Internal Medicine

## 2021-08-23 ENCOUNTER — Other Ambulatory Visit (HOSPITAL_COMMUNITY): Payer: Self-pay

## 2021-08-23 ENCOUNTER — Other Ambulatory Visit: Payer: Self-pay

## 2021-08-23 ENCOUNTER — Inpatient Hospital Stay (HOSPITAL_COMMUNITY)
Admission: EM | Admit: 2021-08-23 | Discharge: 2021-09-07 | DRG: 194 | Disposition: A | Discharge status: Skilled Nursing Facility | Payer: No Typology Code available for payment source | Attending: Internal Medicine | Admitting: Internal Medicine

## 2021-08-23 ENCOUNTER — Emergency Department (HOSPITAL_COMMUNITY): Payer: No Typology Code available for payment source

## 2021-08-23 ENCOUNTER — Encounter (HOSPITAL_COMMUNITY): Payer: Self-pay

## 2021-08-23 ENCOUNTER — Inpatient Hospital Stay: Payer: No Typology Code available for payment source | Admitting: Internal Medicine

## 2021-08-23 ENCOUNTER — Encounter: Payer: Self-pay | Admitting: *Deleted

## 2021-08-23 DIAGNOSIS — R45851 Suicidal ideations: Secondary | ICD-10-CM | POA: Diagnosis not present

## 2021-08-23 DIAGNOSIS — F419 Anxiety disorder, unspecified: Secondary | ICD-10-CM | POA: Diagnosis present

## 2021-08-23 DIAGNOSIS — Z91018 Allergy to other foods: Secondary | ICD-10-CM

## 2021-08-23 DIAGNOSIS — Z85118 Personal history of other malignant neoplasm of bronchus and lung: Secondary | ICD-10-CM | POA: Diagnosis not present

## 2021-08-23 DIAGNOSIS — F339 Major depressive disorder, recurrent, unspecified: Secondary | ICD-10-CM | POA: Diagnosis present

## 2021-08-23 DIAGNOSIS — R0602 Shortness of breath: Principal | ICD-10-CM

## 2021-08-23 DIAGNOSIS — Z86711 Personal history of pulmonary embolism: Secondary | ICD-10-CM | POA: Diagnosis not present

## 2021-08-23 DIAGNOSIS — F331 Major depressive disorder, recurrent, moderate: Secondary | ICD-10-CM

## 2021-08-23 DIAGNOSIS — S2242XS Multiple fractures of ribs, left side, sequela: Secondary | ICD-10-CM | POA: Diagnosis present

## 2021-08-23 DIAGNOSIS — Z91199 Patient's noncompliance with other medical treatment and regimen due to unspecified reason: Secondary | ICD-10-CM

## 2021-08-23 DIAGNOSIS — G893 Neoplasm related pain (acute) (chronic): Secondary | ICD-10-CM | POA: Diagnosis present

## 2021-08-23 DIAGNOSIS — Z885 Allergy status to narcotic agent status: Secondary | ICD-10-CM

## 2021-08-23 DIAGNOSIS — W19XXXD Unspecified fall, subsequent encounter: Secondary | ICD-10-CM | POA: Diagnosis present

## 2021-08-23 DIAGNOSIS — N179 Acute kidney failure, unspecified: Secondary | ICD-10-CM | POA: Diagnosis not present

## 2021-08-23 DIAGNOSIS — J449 Chronic obstructive pulmonary disease, unspecified: Secondary | ICD-10-CM

## 2021-08-23 DIAGNOSIS — Z85828 Personal history of other malignant neoplasm of skin: Secondary | ICD-10-CM

## 2021-08-23 DIAGNOSIS — I10 Essential (primary) hypertension: Secondary | ICD-10-CM | POA: Diagnosis not present

## 2021-08-23 DIAGNOSIS — N400 Enlarged prostate without lower urinary tract symptoms: Secondary | ICD-10-CM | POA: Diagnosis present

## 2021-08-23 DIAGNOSIS — I129 Hypertensive chronic kidney disease with stage 1 through stage 4 chronic kidney disease, or unspecified chronic kidney disease: Secondary | ICD-10-CM | POA: Diagnosis present

## 2021-08-23 DIAGNOSIS — J9611 Chronic respiratory failure with hypoxia: Secondary | ICD-10-CM | POA: Diagnosis present

## 2021-08-23 DIAGNOSIS — Z7960 Long term (current) use of unspecified immunomodulators and immunosuppressants: Secondary | ICD-10-CM

## 2021-08-23 DIAGNOSIS — S2242XD Multiple fractures of ribs, left side, subsequent encounter for fracture with routine healing: Secondary | ICD-10-CM

## 2021-08-23 DIAGNOSIS — Z79899 Other long term (current) drug therapy: Secondary | ICD-10-CM

## 2021-08-23 DIAGNOSIS — Z66 Do not resuscitate: Secondary | ICD-10-CM | POA: Diagnosis not present

## 2021-08-23 DIAGNOSIS — F332 Major depressive disorder, recurrent severe without psychotic features: Secondary | ICD-10-CM | POA: Diagnosis not present

## 2021-08-23 DIAGNOSIS — Z9981 Dependence on supplemental oxygen: Secondary | ICD-10-CM | POA: Diagnosis not present

## 2021-08-23 DIAGNOSIS — Z7984 Long term (current) use of oral hypoglycemic drugs: Secondary | ICD-10-CM

## 2021-08-23 DIAGNOSIS — C799 Secondary malignant neoplasm of unspecified site: Secondary | ICD-10-CM | POA: Diagnosis not present

## 2021-08-23 DIAGNOSIS — R0781 Pleurodynia: Secondary | ICD-10-CM | POA: Diagnosis not present

## 2021-08-23 DIAGNOSIS — J910 Malignant pleural effusion: Secondary | ICD-10-CM | POA: Diagnosis present

## 2021-08-23 DIAGNOSIS — C3492 Malignant neoplasm of unspecified part of left bronchus or lung: Secondary | ICD-10-CM | POA: Diagnosis not present

## 2021-08-23 DIAGNOSIS — E1122 Type 2 diabetes mellitus with diabetic chronic kidney disease: Secondary | ICD-10-CM | POA: Diagnosis present

## 2021-08-23 DIAGNOSIS — N1831 Chronic kidney disease, stage 3a: Secondary | ICD-10-CM | POA: Diagnosis present

## 2021-08-23 DIAGNOSIS — J90 Pleural effusion, not elsewhere classified: Secondary | ICD-10-CM | POA: Diagnosis not present

## 2021-08-23 DIAGNOSIS — Z7189 Other specified counseling: Secondary | ICD-10-CM | POA: Diagnosis not present

## 2021-08-23 DIAGNOSIS — J9621 Acute and chronic respiratory failure with hypoxia: Secondary | ICD-10-CM | POA: Diagnosis present

## 2021-08-23 DIAGNOSIS — M459 Ankylosing spondylitis of unspecified sites in spine: Secondary | ICD-10-CM | POA: Diagnosis present

## 2021-08-23 DIAGNOSIS — Z820 Family history of epilepsy and other diseases of the nervous system: Secondary | ICD-10-CM

## 2021-08-23 DIAGNOSIS — I252 Old myocardial infarction: Secondary | ICD-10-CM

## 2021-08-23 DIAGNOSIS — S2249XA Multiple fractures of ribs, unspecified side, initial encounter for closed fracture: Secondary | ICD-10-CM | POA: Diagnosis present

## 2021-08-23 DIAGNOSIS — Z87891 Personal history of nicotine dependence: Secondary | ICD-10-CM

## 2021-08-23 DIAGNOSIS — R040 Epistaxis: Secondary | ICD-10-CM | POA: Diagnosis not present

## 2021-08-23 DIAGNOSIS — Z515 Encounter for palliative care: Secondary | ICD-10-CM | POA: Diagnosis not present

## 2021-08-23 DIAGNOSIS — J432 Centrilobular emphysema: Secondary | ICD-10-CM | POA: Diagnosis not present

## 2021-08-23 DIAGNOSIS — Z923 Personal history of irradiation: Secondary | ICD-10-CM | POA: Diagnosis not present

## 2021-08-23 DIAGNOSIS — Z6841 Body Mass Index (BMI) 40.0 and over, adult: Secondary | ICD-10-CM | POA: Diagnosis not present

## 2021-08-23 DIAGNOSIS — Y95 Nosocomial condition: Secondary | ICD-10-CM | POA: Diagnosis present

## 2021-08-23 DIAGNOSIS — G4733 Obstructive sleep apnea (adult) (pediatric): Secondary | ICD-10-CM | POA: Diagnosis present

## 2021-08-23 DIAGNOSIS — J189 Pneumonia, unspecified organism: Principal | ICD-10-CM

## 2021-08-23 DIAGNOSIS — Z7982 Long term (current) use of aspirin: Secondary | ICD-10-CM

## 2021-08-23 DIAGNOSIS — Z7901 Long term (current) use of anticoagulants: Secondary | ICD-10-CM

## 2021-08-23 LAB — COMPREHENSIVE METABOLIC PANEL WITH GFR
ALT: 15 U/L (ref 0–44)
AST: 18 U/L (ref 15–41)
Albumin: 2.6 g/dL — ABNORMAL LOW (ref 3.5–5.0)
Alkaline Phosphatase: 61 U/L (ref 38–126)
Anion gap: 8 (ref 5–15)
BUN: 10 mg/dL (ref 8–23)
CO2: 26 mmol/L (ref 22–32)
Calcium: 8.4 mg/dL — ABNORMAL LOW (ref 8.9–10.3)
Chloride: 101 mmol/L (ref 98–111)
Creatinine, Ser: 0.89 mg/dL (ref 0.61–1.24)
GFR, Estimated: >60 mL/min
Glucose, Bld: 139 mg/dL — ABNORMAL HIGH (ref 70–99)
Potassium: 3.7 mmol/L (ref 3.5–5.1)
Sodium: 135 mmol/L (ref 135–145)
Total Bilirubin: 0.1 mg/dL — ABNORMAL LOW (ref 0.3–1.2)
Total Protein: 5.6 g/dL — ABNORMAL LOW (ref 6.5–8.1)

## 2021-08-23 LAB — CBC
HCT: 34.4 % — ABNORMAL LOW (ref 39.0–52.0)
Hemoglobin: 11.2 g/dL — ABNORMAL LOW (ref 13.0–17.0)
MCH: 29.3 pg (ref 26.0–34.0)
MCHC: 32.6 g/dL (ref 30.0–36.0)
MCV: 90.1 fL (ref 80.0–100.0)
Platelets: 487 10*3/uL — ABNORMAL HIGH (ref 150–400)
RBC: 3.82 MIL/uL — ABNORMAL LOW (ref 4.22–5.81)
RDW: 14.6 % (ref 11.5–15.5)
WBC: 13.4 10*3/uL — ABNORMAL HIGH (ref 4.0–10.5)
nRBC: 0 % (ref 0.0–0.2)

## 2021-08-23 LAB — CBC WITH DIFFERENTIAL/PLATELET
Abs Immature Granulocytes: 0.06 10*3/uL (ref 0.00–0.07)
Basophils Absolute: 0 10*3/uL (ref 0.0–0.1)
Basophils Relative: 0 %
Eosinophils Absolute: 0.1 10*3/uL (ref 0.0–0.5)
Eosinophils Relative: 1 %
HCT: 31.1 % — ABNORMAL LOW (ref 39.0–52.0)
Hemoglobin: 9.7 g/dL — ABNORMAL LOW (ref 13.0–17.0)
Immature Granulocytes: 1 %
Lymphocytes Relative: 10 %
Lymphs Abs: 1.3 10*3/uL (ref 0.7–4.0)
MCH: 28.3 pg (ref 26.0–34.0)
MCHC: 31.2 g/dL (ref 30.0–36.0)
MCV: 90.7 fL (ref 80.0–100.0)
Monocytes Absolute: 0.9 10*3/uL (ref 0.1–1.0)
Monocytes Relative: 8 %
Neutro Abs: 9.7 10*3/uL — ABNORMAL HIGH (ref 1.7–7.7)
Neutrophils Relative %: 80 %
Platelets: 437 10*3/uL — ABNORMAL HIGH (ref 150–400)
RBC: 3.43 MIL/uL — ABNORMAL LOW (ref 4.22–5.81)
RDW: 14.6 % (ref 11.5–15.5)
WBC: 12.1 10*3/uL — ABNORMAL HIGH (ref 4.0–10.5)
nRBC: 0 % (ref 0.0–0.2)

## 2021-08-23 LAB — I-STAT VENOUS BLOOD GAS, ED
Acid-Base Excess: 4 mmol/L — ABNORMAL HIGH (ref 0.0–2.0)
Bicarbonate: 28.2 mmol/L — ABNORMAL HIGH (ref 20.0–28.0)
Calcium, Ion: 1.09 mmol/L — ABNORMAL LOW (ref 1.15–1.40)
HCT: 35 % — ABNORMAL LOW (ref 39.0–52.0)
Hemoglobin: 11.9 g/dL — ABNORMAL LOW (ref 13.0–17.0)
O2 Saturation: 97 %
Potassium: 4.7 mmol/L (ref 3.5–5.1)
Sodium: 134 mmol/L — ABNORMAL LOW (ref 135–145)
TCO2: 29 mmol/L (ref 22–32)
pCO2, Ven: 41.7 mmHg — ABNORMAL LOW (ref 44–60)
pH, Ven: 7.438 — ABNORMAL HIGH (ref 7.25–7.43)
pO2, Ven: 91 mmHg — ABNORMAL HIGH (ref 32–45)

## 2021-08-23 LAB — COMPREHENSIVE METABOLIC PANEL
ALT: 18 U/L (ref 0–44)
AST: 21 U/L (ref 15–41)
Albumin: 3.1 g/dL — ABNORMAL LOW (ref 3.5–5.0)
Alkaline Phosphatase: 72 U/L (ref 38–126)
Anion gap: 10 (ref 5–15)
BUN: 14 mg/dL (ref 8–23)
CO2: 23 mmol/L (ref 22–32)
Calcium: 8.7 mg/dL — ABNORMAL LOW (ref 8.9–10.3)
Chloride: 101 mmol/L (ref 98–111)
Creatinine, Ser: 0.89 mg/dL (ref 0.61–1.24)
GFR, Estimated: >60 mL/min (ref >60)
Glucose, Bld: 144 mg/dL — ABNORMAL HIGH (ref 70–99)
Potassium: 4.7 mmol/L (ref 3.5–5.1)
Sodium: 134 mmol/L — ABNORMAL LOW (ref 135–145)
Total Bilirubin: 0.3 mg/dL (ref 0.3–1.2)
Total Protein: 6.4 g/dL — ABNORMAL LOW (ref 6.5–8.1)

## 2021-08-23 LAB — TROPONIN I (HIGH SENSITIVITY)
Troponin I (High Sensitivity): 9 ng/L (ref <18)
Troponin I (High Sensitivity): 15 ng/L (ref <18)

## 2021-08-23 LAB — MAGNESIUM: Magnesium: 2 mg/dL (ref 1.7–2.4)

## 2021-08-23 LAB — BRAIN NATRIURETIC PEPTIDE
B Natriuretic Peptide: 89.5 pg/mL (ref 0.0–100.0)
B Natriuretic Peptide: 120.8 pg/mL — ABNORMAL HIGH (ref 0.0–100.0)

## 2021-08-23 LAB — PROCALCITONIN: Procalcitonin: <0.1 ng/mL

## 2021-08-23 MED ORDER — OXYCODONE HCL ER 10 MG PO T12A
20.0000 mg | EXTENDED_RELEASE_TABLET | Freq: Once | ORAL | Status: AC
  Administered 2021-08-23: 20 mg via ORAL
  Filled 2021-08-23: qty 2

## 2021-08-23 MED ORDER — METHYLPREDNISOLONE 4 MG PO TBPK
ORAL_TABLET | ORAL | 0 refills | Status: DC
  Filled 2021-08-23: qty 21, 7d supply, fill #0

## 2021-08-23 MED ORDER — VANCOMYCIN HCL 2000 MG/400ML IV SOLN
2000.0000 mg | Freq: Once | INTRAVENOUS | Status: AC
  Administered 2021-08-24: 2000 mg via INTRAVENOUS
  Filled 2021-08-23: qty 400

## 2021-08-23 MED ORDER — SODIUM CHLORIDE 0.9 % IV SOLN
2.0000 g | Freq: Once | INTRAVENOUS | Status: AC
  Administered 2021-08-24: 2 g via INTRAVENOUS
  Filled 2021-08-23: qty 12.5

## 2021-08-23 MED ORDER — IOHEXOL 350 MG/ML SOLN
75.0000 mL | Freq: Once | INTRAVENOUS | Status: AC | PRN
  Administered 2021-08-23: 75 mL via INTRAVENOUS

## 2021-08-23 MED ORDER — OXYCODONE HCL 5 MG PO TABS
10.0000 mg | ORAL_TABLET | Freq: Once | ORAL | Status: AC
  Administered 2021-08-23: 10 mg via ORAL
  Filled 2021-08-23: qty 2

## 2021-08-23 NOTE — TOC Transition Note (Addendum)
Transition of Care [facility]) - CM/SW Discharge Note   Patient Details  Name: Mario [name] MRN: [id number] Date of Birth: 1951-01-16  Transition of Care [facility]) CM/SW Contact:  Tom-Johnson, Hershal Coria, RN Phone Number: 08/23/2021, 11:52 AM   Clinical Narrative:     Patient is scheduled for discharge today. Readmission prevention assessment done. Patient is active with the [facility]. CM contacted Kaitlyn and notified her of patient's discharge and home health order.  CM faxed Discharge summary and home health order to Dr. Moshe Cipro at the [place] [phone]) with successful notice received.  Home health referral sent to [facility] per patient's request. Patient states he is currently active with their PT/RN services. Marylu Lund verified patient is active with their services and needs Discharge summary and Home health PT/RN Resumption of care order faxed to [phone] with successful notice received.  Patient requests PTAR scheduled at discharge.  No further TOC needs noted.   1320- Received a call from [place] with [facility] health that they will not be accepting patient at this time d/t patient not home health appropriate and they do not care for Pleurx drains.  CM notified patient and MD as PTAR already o unit to transport patient. CM sent referral to [facility] and Elnita Maxwell voiced acceptance and info on AVS. No further TOC needs noted.    Final next level of care: Home w Home Health Services Barriers to Discharge: Barriers Resolved   Patient Goals and CMS Choice Patient states their goals for this hospitalization and ongoing recovery are:: To return home CMS Medicare.gov Compare Post Acute Care list provided to:: Patient Choice offered to / list presented to : Patient  Discharge Placement                Patient to be transferred to facility by: PTAR      Discharge Plan and Services                DME Arranged: N/A DME Agency: NA       HH Arranged: PT, RN, Disease  Management HH Agency: Other - See comment ([facility]) Date [facility] Agency Contacted: 08/23/21 Time HH Agency Contacted: 1145 Representative spoke with at [facility] Agency: Marylu Lund  Social Determinants of Health (SDOH) Interventions     Readmission Risk Interventions    08/23/2021   11:48 AM 08/05/2021   11:02 AM 06/07/2021    4:15 PM  Readmission Risk Prevention Plan  Transportation Screening Complete Complete Complete  Medication Review Ocean[facility]) Complete Complete Complete  PCP or Specialist appointment within 3-5 days of discharge Complete Complete Complete  HRI or Home Care Consult Complete Complete Complete  SW Recovery Care/Counseling Consult Complete Complete Complete  Palliative Care Screening Not Applicable Not Applicable Not Applicable  Skilled Nursing Facility Not Applicable Not Applicable Not Applicable

## 2021-08-23 NOTE — ED Notes (Signed)
O2 dropped to 93% on 4L of O2 while ambulating

## 2021-08-23 NOTE — ED Notes (Signed)
Ambulated pt. Pt's O2 dropped to 93% on 4L [place]. Pt became tachycardiac and tachypneic with a RR of 30 while ambulating. Pt resting back in bed comfortably.

## 2021-08-24 ENCOUNTER — Encounter (HOSPITAL_COMMUNITY): Payer: Self-pay | Admitting: Internal Medicine

## 2021-08-24 DIAGNOSIS — J432 Centrilobular emphysema: Secondary | ICD-10-CM | POA: Diagnosis not present

## 2021-08-24 DIAGNOSIS — J9611 Chronic respiratory failure with hypoxia: Secondary | ICD-10-CM | POA: Diagnosis not present

## 2021-08-24 DIAGNOSIS — S2249XA Multiple fractures of ribs, unspecified side, initial encounter for closed fracture: Secondary | ICD-10-CM | POA: Diagnosis present

## 2021-08-24 DIAGNOSIS — S2242XS Multiple fractures of ribs, left side, sequela: Secondary | ICD-10-CM | POA: Diagnosis present

## 2021-08-24 LAB — CBC
HCT: 35.3 % — ABNORMAL LOW (ref 39.0–52.0)
Hemoglobin: 11.1 g/dL — ABNORMAL LOW (ref 13.0–17.0)
MCH: 28.5 pg (ref 26.0–34.0)
MCHC: 31.4 g/dL (ref 30.0–36.0)
MCV: 90.5 fL (ref 80.0–100.0)
Platelets: 459 10*3/uL — ABNORMAL HIGH (ref 150–400)
RBC: 3.9 MIL/uL — ABNORMAL LOW (ref 4.22–5.81)
RDW: 14.6 % (ref 11.5–15.5)
WBC: 11.6 10*3/uL — ABNORMAL HIGH (ref 4.0–10.5)
nRBC: 0 % (ref 0.0–0.2)

## 2021-08-24 LAB — BASIC METABOLIC PANEL
Anion gap: 10 (ref 5–15)
BUN: 10 mg/dL (ref 8–23)
CO2: 26 mmol/L (ref 22–32)
Calcium: 9 mg/dL (ref 8.9–10.3)
Chloride: 101 mmol/L (ref 98–111)
Creatinine, Ser: 0.88 mg/dL (ref 0.61–1.24)
GFR, Estimated: >60 mL/min (ref >60)
Glucose, Bld: 103 mg/dL — ABNORMAL HIGH (ref 70–99)
Potassium: 3.7 mmol/L (ref 3.5–5.1)
Sodium: 137 mmol/L (ref 135–145)

## 2021-08-24 LAB — PROCALCITONIN: Procalcitonin: <0.1 ng/mL

## 2021-08-24 LAB — C-REACTIVE PROTEIN: CRP: 2.8 mg/dL — ABNORMAL HIGH (ref <1.0)

## 2021-08-24 LAB — LACTIC ACID, PLASMA: Lactic Acid, Venous: 1.1 mmol/L (ref 0.5–1.9)

## 2021-08-24 LAB — BRAIN NATRIURETIC PEPTIDE: B Natriuretic Peptide: 84.1 pg/mL (ref 0.0–100.0)

## 2021-08-24 MED ORDER — TRAZODONE HCL 50 MG PO TABS
75.0000 mg | ORAL_TABLET | Freq: Every day | ORAL | Status: DC
  Administered 2021-08-24 – 2021-09-06 (×15): 75 mg via ORAL
  Filled 2021-08-24 (×15): qty 2

## 2021-08-24 MED ORDER — DULOXETINE HCL 60 MG PO CPEP
60.0000 mg | ORAL_CAPSULE | Freq: Two times a day (BID) | ORAL | Status: DC
  Administered 2021-08-24 – 2021-09-07 (×30): 60 mg via ORAL
  Filled 2021-08-24 (×30): qty 1

## 2021-08-24 MED ORDER — ACETAMINOPHEN 325 MG PO TABS
650.0000 mg | ORAL_TABLET | Freq: Four times a day (QID) | ORAL | Status: DC | PRN
  Administered 2021-08-24 – 2021-08-25 (×5): 650 mg via ORAL
  Filled 2021-08-24 (×5): qty 2

## 2021-08-24 MED ORDER — ONDANSETRON HCL 4 MG PO TABS
4.0000 mg | ORAL_TABLET | Freq: Four times a day (QID) | ORAL | Status: DC | PRN
  Administered 2021-09-06: 4 mg via ORAL
  Filled 2021-08-24: qty 1

## 2021-08-24 MED ORDER — ASPIRIN 81 MG PO TBEC
81.0000 mg | DELAYED_RELEASE_TABLET | Freq: Every day | ORAL | Status: DC
  Administered 2021-08-24 – 2021-09-06 (×14): 81 mg via ORAL
  Filled 2021-08-24 (×14): qty 1

## 2021-08-24 MED ORDER — OXYCODONE HCL ER 15 MG PO T12A
15.0000 mg | EXTENDED_RELEASE_TABLET | Freq: Two times a day (BID) | ORAL | Status: DC
  Administered 2021-08-24 – 2021-08-25 (×3): 15 mg via ORAL
  Filled 2021-08-24 (×3): qty 1

## 2021-08-24 MED ORDER — CYCLOSPORINE 0.05 % OP EMUL
1.0000 [drp] | Freq: Two times a day (BID) | OPHTHALMIC | Status: DC
  Administered 2021-08-24 – 2021-09-07 (×29): 1 [drp] via OPHTHALMIC
  Filled 2021-08-24 (×30): qty 30

## 2021-08-24 MED ORDER — OXYCODONE HCL ER 15 MG PO T12A: 15.0000 mg | EXTENDED_RELEASE_TABLET | Freq: Two times a day (BID) | ORAL | Status: DC

## 2021-08-24 MED ORDER — ALBUTEROL SULFATE (2.5 MG/3ML) 0.083% IN NEBU
2.5000 mg | INHALATION_SOLUTION | RESPIRATORY_TRACT | Status: DC | PRN
  Administered 2021-09-05: 2.5 mg via RESPIRATORY_TRACT
  Filled 2021-08-24: qty 3

## 2021-08-24 MED ORDER — BUPROPION HCL ER (XL) 300 MG PO TB24
300.0000 mg | ORAL_TABLET | Freq: Every day | ORAL | Status: DC
  Administered 2021-08-24 – 2021-09-07 (×15): 300 mg via ORAL
  Filled 2021-08-24 (×15): qty 1

## 2021-08-24 MED ORDER — OXCARBAZEPINE 300 MG PO TABS
900.0000 mg | ORAL_TABLET | Freq: Every day | ORAL | Status: DC
  Administered 2021-08-24 – 2021-09-06 (×15): 900 mg via ORAL
  Filled 2021-08-24 (×15): qty 3

## 2021-08-24 MED ORDER — OXYCODONE HCL 5 MG PO TABS
5.0000 mg | ORAL_TABLET | Freq: Two times a day (BID) | ORAL | Status: DC | PRN
  Administered 2021-08-24 – 2021-08-25 (×3): 5 mg via ORAL
  Filled 2021-08-24 (×3): qty 1

## 2021-08-24 MED ORDER — OXCARBAZEPINE 300 MG PO TABS
600.0000 mg | ORAL_TABLET | Freq: Every day | ORAL | Status: DC
  Administered 2021-08-24 – 2021-09-07 (×15): 600 mg via ORAL
  Filled 2021-08-24 (×16): qty 2

## 2021-08-24 MED ORDER — ARFORMOTEROL TARTRATE 15 MCG/2ML IN NEBU
15.0000 ug | INHALATION_SOLUTION | Freq: Two times a day (BID) | RESPIRATORY_TRACT | Status: DC
  Administered 2021-08-24 – 2021-09-07 (×26): 15 ug via RESPIRATORY_TRACT
  Filled 2021-08-24 (×29): qty 2

## 2021-08-24 MED ORDER — SENNOSIDES-DOCUSATE SODIUM 8.6-50 MG PO TABS
1.0000 | ORAL_TABLET | Freq: Two times a day (BID) | ORAL | Status: DC
  Administered 2021-08-24 – 2021-08-26 (×6): 1 via ORAL
  Filled 2021-08-24 (×6): qty 1

## 2021-08-24 MED ORDER — MELATONIN 3 MG PO TABS
6.0000 mg | ORAL_TABLET | Freq: Every day | ORAL | Status: DC
  Administered 2021-08-24 – 2021-09-06 (×15): 6 mg via ORAL
  Filled 2021-08-24 (×15): qty 2

## 2021-08-24 MED ORDER — LURASIDONE HCL 20 MG PO TABS
40.0000 mg | ORAL_TABLET | Freq: Every day | ORAL | Status: DC
  Administered 2021-08-24 – 2021-09-06 (×14): 40 mg via ORAL
  Filled 2021-08-24 (×12): qty 2
  Filled 2021-08-24: qty 1
  Filled 2021-08-24 (×2): qty 2

## 2021-08-24 MED ORDER — POLYETHYLENE GLYCOL 3350 17 G PO PACK
17.0000 g | PACK | Freq: Every day | ORAL | Status: DC
  Administered 2021-08-24 – 2021-08-31 (×8): 17 g via ORAL
  Filled 2021-08-24 (×9): qty 1

## 2021-08-24 MED ORDER — SODIUM CHLORIDE 0.9 % IV SOLN
2.0000 g | INTRAVENOUS | Status: DC
  Administered 2021-08-24 – 2021-08-26 (×3): 2 g via INTRAVENOUS
  Filled 2021-08-24 (×3): qty 20

## 2021-08-24 MED ORDER — BUSPIRONE HCL 5 MG PO TABS
15.0000 mg | ORAL_TABLET | Freq: Every day | ORAL | Status: DC
  Administered 2021-08-24 – 2021-09-07 (×15): 15 mg via ORAL
  Filled 2021-08-24 (×15): qty 3

## 2021-08-24 MED ORDER — TAMSULOSIN HCL 0.4 MG PO CAPS
0.4000 mg | ORAL_CAPSULE | Freq: Every day | ORAL | Status: DC
  Administered 2021-08-24 – 2021-09-07 (×14): 0.4 mg via ORAL
  Filled 2021-08-24 (×14): qty 1

## 2021-08-24 MED ORDER — SODIUM CHLORIDE 0.9% FLUSH
3.0000 mL | Freq: Two times a day (BID) | INTRAVENOUS | Status: DC
  Administered 2021-08-24 – 2021-09-06 (×26): 3 mL via INTRAVENOUS

## 2021-08-24 MED ORDER — BUDESONIDE 0.5 MG/2ML IN SUSP
0.5000 mg | Freq: Every day | RESPIRATORY_TRACT | Status: DC
Start: 2021-08-24 — End: 2021-09-07
  Administered 2021-08-24 – 2021-09-06 (×13): 0.5 mg via RESPIRATORY_TRACT
  Filled 2021-08-24 (×15): qty 2

## 2021-08-24 MED ORDER — ONDANSETRON HCL 4 MG/2ML IJ SOLN: 4.0000 mg | Freq: Four times a day (QID) | INTRAMUSCULAR | Status: DC | PRN

## 2021-08-24 MED ORDER — MYCOPHENOLATE MOFETIL 250 MG PO CAPS
1500.0000 mg | ORAL_CAPSULE | Freq: Two times a day (BID) | ORAL | Status: DC
  Administered 2021-08-24 – 2021-09-07 (×30): 1500 mg via ORAL
  Filled 2021-08-24 (×31): qty 6

## 2021-08-24 MED ORDER — ACETAMINOPHEN 650 MG RE SUPP: 650.0000 mg | Freq: Four times a day (QID) | RECTAL | Status: DC | PRN

## 2021-08-24 MED ORDER — APIXABAN 5 MG PO TABS
5.0000 mg | ORAL_TABLET | Freq: Two times a day (BID) | ORAL | Status: DC
  Administered 2021-08-24 – 2021-09-06 (×28): 5 mg via ORAL
  Filled 2021-08-24 (×29): qty 1

## 2021-08-24 MED ORDER — TRAMADOL HCL 50 MG PO TABS
50.0000 mg | ORAL_TABLET | Freq: Three times a day (TID) | ORAL | Status: DC | PRN
  Administered 2021-08-24 – 2021-08-25 (×3): 50 mg via ORAL
  Filled 2021-08-24 (×3): qty 1

## 2021-08-24 MED ORDER — GUAIFENESIN ER 600 MG PO TB12
600.0000 mg | ORAL_TABLET | Freq: Two times a day (BID) | ORAL | Status: DC | PRN
Start: 2021-08-24 — End: 2021-09-07

## 2021-08-24 MED ORDER — DILTIAZEM HCL ER COATED BEADS 180 MG PO CP24
180.0000 mg | ORAL_CAPSULE | Freq: Every day | ORAL | Status: DC
  Administered 2021-08-24 – 2021-09-07 (×15): 180 mg via ORAL
  Filled 2021-08-24 (×15): qty 1

## 2021-08-24 MED ORDER — SODIUM CHLORIDE 0.9 % IV SOLN
500.0000 mg | INTRAVENOUS | Status: DC
  Administered 2021-08-24 – 2021-08-25 (×2): 500 mg via INTRAVENOUS
  Filled 2021-08-24 (×2): qty 5

## 2021-08-24 MED ORDER — GABAPENTIN 300 MG PO CAPS
300.0000 mg | ORAL_CAPSULE | Freq: Two times a day (BID) | ORAL | Status: DC
  Administered 2021-08-24 – 2021-09-07 (×30): 300 mg via ORAL
  Filled 2021-08-24 (×30): qty 1

## 2021-08-24 NOTE — NC FL2 (Cosign Needed Addendum)
[place] LEVEL OF CARE SCREENING TOOL     IDENTIFICATION  Patient Name: Mario [name] Birthdate: 1950-11-19 Sex: male Admission Date (Current Location): 08/23/2021  [facility] and [place] Number:  Herbalist and Address:  [facility] [place], [place] [address], [place], [place] [address]      Provider Number: [id number]  Attending Physician Name and Address:  Vianne Bulls, MD  Relative Name and Phone Number:       Current Level of Care: Hospital Recommended Level of Care: [place] Prior Approval Number:    Date Approved/Denied:   PASRR Number: [id number] E expires 09/25/21  Discharge Plan: SNF    Current Diagnoses: Patient Active Problem List   Diagnosis Date Noted   Rib fractures 08/24/2021   Pleural effusion, left 08/20/2021   Metastatic carcinoma ([place]) 08/07/2021   Non-small cell lung cancer, left ([place]) 08/07/2021   Chronic respiratory failure with hypoxia ([place]) 08/07/2021   Rib pain 08/03/2021   Encounter for antineoplastic chemotherapy 07/20/2021   Leg paresthesia 06/17/2021   Sepsis ([place]) 06/17/2021   Obesity, Class III, BMI 40-49.9 (morbid obesity) ([place]) 06/17/2021   Pneumonia 06/17/2021   Hypertension    Allergic rhinitis    History of pulmonary embolism    Hyponatremia 05/20/2021   Mood disorder ([place]) 02/03/2021   Non-small cell cancer of left lung ([place]) 09/21/2020   Lung nodule 07/30/2020   Mass of left lung    Constipation    HCAP (healthcare-associated pneumonia) 04/03/2020   COPD with acute exacerbation ([place]) 01/05/2020   Vertigo 12/26/2019   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   Stage 3a chronic kidney disease ([place]) 12/26/2019   Abnormal CT of the chest 12/26/2019   COPD (chronic obstructive pulmonary disease) ([place])    Hyperglycemia 06/23/2019   OSA (obstructive sleep apnea) 06/23/2019   Major depressive disorder, recurrent episode ([place]) 10/12/2018   Adjustment disorder with mixed  disturbance of emotions and conduct 02/21/2018   Cellulitis of both feet 07/16/2017   DM2 (diabetes mellitus, type 2) ([place]) 07/16/2017   HLA B27 (HLA B27 positive) 07/16/2017   Hyperkalemia    Hypoxia    Dyspnea 05/23/2014   COPD exacerbation ([place]) 05/23/2014   Malingering 01/21/2013   Depression 01/11/2013   Tobacco abuse 01/11/2013   Obesity, unspecified 01/11/2013   Hypertension associated with diabetes ([place]) 01/10/2013    Orientation RESPIRATION BLADDER Height & Weight     Self, Situation, Time, Place  O2 (2L nasal cannula) Continent Weight: (!) 324 lb 1.2 oz (147 kg) Height:  6' 2" (188 cm)  BEHAVIORAL SYMPTOMS/MOOD NEUROLOGICAL BOWEL NUTRITION STATUS      Continent Diet (See dc summary)  AMBULATORY STATUS COMMUNICATION OF NEEDS Skin   Limited Assist Verbally Surgical wounds (Closed incision on flank)                       Personal Care Assistance Level of Assistance  Bathing, Feeding, Dressing Bathing Assistance: Limited assistance Feeding assistance: Independent Dressing Assistance: Limited assistance     Functional Limitations Info             SPECIAL CARE FACTORS FREQUENCY  PT (By licensed PT), OT (By licensed OT)     PT Frequency: 5x/week OT Frequency: 5x/week            Contractures Contractures Info: Not present    Additional Factors Info  Code Status, Allergies, Psychotropic Code Status Info: Full Allergies Info: Demerol (Meperidine), Zocor (  Simvastatin), Beet (Beta Vulgaris), Liver Psychotropic Info: Wellbutrin, Cymbalta, Buspar         Current Medications (08/24/2021):  This is the current hospital active medication list Current Facility-Administered Medications  Medication Dose Route Frequency Provider Last Rate Last Admin   acetaminophen (TYLENOL) tablet 650 mg  650 mg Oral Q6H PRN Opyd, Ilene Qua, MD   650 mg at 08/24/21 6269   Or   acetaminophen (TYLENOL) suppository 650 mg  650 mg Rectal Q6H PRN Opyd, Ilene Qua, MD        albuterol (PROVENTIL) (2.5 MG/3ML) 0.083% nebulizer solution 2.5 mg  2.5 mg Nebulization Q4H PRN Opyd, Ilene Qua, MD       apixaban (ELIQUIS) tablet 5 mg  5 mg Oral BID Opyd, Ilene Qua, MD   5 mg at 08/24/21 0910   arformoterol (BROVANA) nebulizer solution 15 mcg  15 mcg Nebulization BID Opyd, Ilene Qua, MD   15 mcg at 08/24/21 1043   aspirin EC tablet 81 mg  81 mg Oral Daily Opyd, Ilene Qua, MD   81 mg at 08/24/21 0910   azithromycin (ZITHROMAX) 500 mg in sodium chloride 0.9 % 250 mL IVPB  500 mg Intravenous Q24H Opyd, Ilene Qua, MD   Stopped at 08/24/21 0812   budesonide (PULMICORT) nebulizer solution 0.5 mg  0.5 mg Inhalation Q2000 Opyd, Ilene Qua, MD       buPROPion (WELLBUTRIN XL) 24 hr tablet 300 mg  300 mg Oral Daily Opyd, Ilene Qua, MD   300 mg at 08/24/21 1039   busPIRone (BUSPAR) tablet 15 mg  15 mg Oral Daily Opyd, Ilene Qua, MD   15 mg at 08/24/21 0910   cefTRIAXone (ROCEPHIN) 2 g in sodium chloride 0.9 % 100 mL IVPB  2 g Intravenous Q24H Opyd, Ilene Qua, MD   Stopped at 08/24/21 0601   cycloSPORINE (RESTASIS) 0.05 % ophthalmic emulsion 1 drop  1 drop Both Eyes BID Opyd, Ilene Qua, MD   1 drop at 08/24/21 1039   diltiazem (CARDIZEM CD) 24 hr capsule 180 mg  180 mg Oral Daily Opyd, Ilene Qua, MD   180 mg at 08/24/21 0910   DULoxetine (CYMBALTA) DR capsule 60 mg  60 mg Oral BID Vianne Bulls, MD   60 mg at 08/24/21 0910   gabapentin (NEURONTIN) capsule 300 mg  300 mg Oral BID Opyd, Ilene Qua, MD   300 mg at 08/24/21 0910   guaiFENesin (MUCINEX) 12 hr tablet 600 mg  600 mg Oral BID PRN Opyd, Ilene Qua, MD       lurasidone (LATUDA) tablet 40 mg  40 mg Oral QPC supper Opyd, Ilene Qua, MD       melatonin tablet 6 mg  6 mg Oral QHS Opyd, Ilene Qua, MD   6 mg at 08/24/21 4854   mycophenolate (CELLCEPT) capsule 1,500 mg  1,500 mg Oral BID Opyd, Ilene Qua, MD   1,500 mg at 08/24/21 1039   ondansetron (ZOFRAN) tablet 4 mg  4 mg Oral Q6H PRN Opyd, Ilene Qua, MD       Or   ondansetron (ZOFRAN)  injection 4 mg  4 mg Intravenous Q6H PRN Opyd, Ilene Qua, MD       Oxcarbazepine (TRILEPTAL) tablet 600 mg  600 mg Oral Daily Opyd, Ilene Qua, MD   600 mg at 08/24/21 1039   Oxcarbazepine (TRILEPTAL) tablet 900 mg  900 mg Oral QHS Vianne Bulls, MD   900 mg at 08/24/21 6270   oxyCODONE (Oxy  IR/ROXICODONE) immediate release tablet 5 mg  5 mg Oral BID PRN Opyd, Ilene Qua, MD   5 mg at 08/24/21 0406   oxyCODONE (OXYCONTIN) 12 hr tablet 15 mg  15 mg Oral Q12H Opyd, Ilene Qua, MD   15 mg at 08/24/21 0910   polyethylene glycol (MIRALAX / GLYCOLAX) packet 17 g  17 g Oral Daily Opyd, Ilene Qua, MD   17 g at 08/24/21 0910   senna-docusate (Senokot-S) tablet 1 tablet  1 tablet Oral BID Vianne Bulls, MD   1 tablet at 08/24/21 0910   sodium chloride flush (NS) 0.9 % injection 3 mL  3 mL Intravenous Q12H Opyd, Ilene Qua, MD   3 mL at 08/24/21 0911   tamsulosin (FLOMAX) capsule 0.4 mg  0.4 mg Oral Daily Opyd, Ilene Qua, MD   0.4 mg at 08/24/21 0911   traZODone (DESYREL) tablet 75 mg  75 mg Oral QHS Vianne Bulls, MD   75 mg at 08/24/21 0220     Discharge Medications: Please see discharge summary for a list of discharge medications.  Relevant Imaging Results:  Relevant Lab Results:   Additional Information SSN: [id number]. Moderna COVID-19 Vaccine 08/11/2020 , 08/15/2019 , 07/18/2019  Benard Halsted, LCSW

## 2021-08-25 ENCOUNTER — Encounter (HOSPITAL_COMMUNITY): Payer: Self-pay | Admitting: Family Medicine

## 2021-08-25 DIAGNOSIS — R0781 Pleurodynia: Secondary | ICD-10-CM

## 2021-08-25 DIAGNOSIS — Z515 Encounter for palliative care: Secondary | ICD-10-CM

## 2021-08-25 DIAGNOSIS — J9611 Chronic respiratory failure with hypoxia: Secondary | ICD-10-CM

## 2021-08-25 DIAGNOSIS — C3492 Malignant neoplasm of unspecified part of left bronchus or lung: Secondary | ICD-10-CM

## 2021-08-25 DIAGNOSIS — J189 Pneumonia, unspecified organism: Principal | ICD-10-CM

## 2021-08-25 DIAGNOSIS — Z7189 Other specified counseling: Secondary | ICD-10-CM

## 2021-08-25 DIAGNOSIS — Z66 Do not resuscitate: Secondary | ICD-10-CM

## 2021-08-25 LAB — CULTURE, BLOOD (ROUTINE X 2)
Culture: NO GROWTH
Culture: NO GROWTH
Special Requests: ADEQUATE

## 2021-08-25 LAB — PROCALCITONIN: Procalcitonin: <0.1 ng/mL

## 2021-08-25 LAB — CBC
HCT: 34 % — ABNORMAL LOW (ref 39.0–52.0)
Hemoglobin: 10.8 g/dL — ABNORMAL LOW (ref 13.0–17.0)
MCH: 28.6 pg (ref 26.0–34.0)
MCHC: 31.8 g/dL (ref 30.0–36.0)
MCV: 89.9 fL (ref 80.0–100.0)
Platelets: 449 10*3/uL — ABNORMAL HIGH (ref 150–400)
RBC: 3.78 MIL/uL — ABNORMAL LOW (ref 4.22–5.81)
RDW: 14.6 % (ref 11.5–15.5)
WBC: 9.2 10*3/uL (ref 4.0–10.5)
nRBC: 0 % (ref 0.0–0.2)

## 2021-08-25 LAB — BASIC METABOLIC PANEL
Anion gap: 11 (ref 5–15)
BUN: 13 mg/dL (ref 8–23)
CO2: 26 mmol/L (ref 22–32)
Calcium: 8.9 mg/dL (ref 8.9–10.3)
Chloride: 100 mmol/L (ref 98–111)
Creatinine, Ser: 0.95 mg/dL (ref 0.61–1.24)
GFR, Estimated: >60 mL/min (ref >60)
Glucose, Bld: 110 mg/dL — ABNORMAL HIGH (ref 70–99)
Potassium: 4 mmol/L (ref 3.5–5.1)
Sodium: 137 mmol/L (ref 135–145)

## 2021-08-25 LAB — MAGNESIUM: Magnesium: 2.1 mg/dL (ref 1.7–2.4)

## 2021-08-25 LAB — BRAIN NATRIURETIC PEPTIDE: B Natriuretic Peptide: 44.1 pg/mL (ref 0.0–100.0)

## 2021-08-25 MED ORDER — LIDOCAINE 5 % EX PTCH
1.0000 | MEDICATED_PATCH | CUTANEOUS | Status: DC
Start: 2021-08-25 — End: 2021-08-30
  Administered 2021-08-25 – 2021-08-29 (×5): 1 via TRANSDERMAL
  Filled 2021-08-25 (×5): qty 1

## 2021-08-25 MED ORDER — AZITHROMYCIN 500 MG PO TABS
500.0000 mg | ORAL_TABLET | ORAL | Status: AC
  Administered 2021-08-26 – 2021-08-28 (×3): 500 mg via ORAL
  Filled 2021-08-25 (×3): qty 1

## 2021-08-25 MED ORDER — OXYCODONE HCL 5 MG PO TABS
5.0000 mg | ORAL_TABLET | ORAL | Status: DC | PRN
  Administered 2021-08-25 – 2021-09-07 (×51): 5 mg via ORAL
  Filled 2021-08-25 (×51): qty 1

## 2021-08-25 MED ORDER — ACETAMINOPHEN 500 MG PO TABS
1000.0000 mg | ORAL_TABLET | Freq: Three times a day (TID) | ORAL | Status: DC
  Administered 2021-08-25 – 2021-09-07 (×39): 1000 mg via ORAL
  Filled 2021-08-25 (×40): qty 2

## 2021-08-25 MED ORDER — OXYCODONE HCL ER 20 MG PO T12A
20.0000 mg | EXTENDED_RELEASE_TABLET | Freq: Two times a day (BID) | ORAL | Status: DC
  Administered 2021-08-25 – 2021-09-07 (×26): 20 mg via ORAL
  Filled 2021-08-25 (×26): qty 1

## 2021-08-25 NOTE — Progress Notes (Signed)
Pt still doesn't want to wear hospital CPAP for the night.

## 2021-08-25 NOTE — Consult Note (Signed)
Consultation Note Date: 08/25/2021   Patient Name: Mario [name]  DOB: Feb 05, 1951  MRN: [id number]  Age / Sex: 71 y.o., male  PCP: [facility] Referring Physician: Jennye Boroughs, MD  Reason for Consultation: Establishing goals of care  HPI/Patient Profile: 71 y.o. male  with past medical history of COPD, malignant non-small cell lung cancer, vasculitis, PE on Eliquis, hypertension, chronic pain, malignant pleural effusion with Pleurx, OSA, and multiple hospitalizations admitted on 08/23/2021 with increased shortness of breath.  Of note, patient just discharged 3 to 4 hours prior to readmission.  Patient with ongoing pleuritic left-sided chest pain likely related to history of left-sided rib fractures related to a fall and also metastatic non-small cell lung cancer.  Also has malignant pleural effusion and Pleurx catheter on left-side of chest.  PMT consulted to discuss goals of care.  Of note, patient with 8 admissions and 2 ED visits in past 6 months.  Clinical Assessment and Goals of Care: I have reviewed medical records including EPIC notes, labs and imaging, assessed the patient and then met with with patient to discuss diagnosis prognosis, GOC, EOL wishes, disposition and options.  I introduced Palliative Medicine as specialized medical care for people living with serious illness. It focuses on providing relief from the symptoms and stress of a serious illness. The goal is to improve quality of life for both the patient and the family.  Patient tells me prior to recent admissions he is not very ambulatory at home.  He tells me he had become unsteady.   We discussed patient's current illness and what it means in the larger context of patient's on-going co-morbidities.  We reviewed his metastatic cancer and pleural effusions.  We reviewed his decline in functional status.  We reviewed his chronic pain.  I attempted to elicit values  and goals of care important to the patient.    The difference between aggressive medical intervention and comfort care was considered in light of the patient's goals of care.   Patient tells me his main goal is to go to rehab and try to rebuild his strength.  He continues to be interested in meeting with oncology and pursue further treatment options.  Advance directives, concepts specific to code status, artificial feeding and hydration, and rehospitalization were considered and discussed.  Encouraged patient to consider DNR/DNI status understanding evidenced based poor outcomes in similar hospitalized patients, as the cause of the arrest is likely associated with chronic/terminal disease rather than a reversible acute cardio-pulmonary event.  Patient agrees with DNR/DNI status and tells me he would want a natural death.  I completed a MOST form today. The patient and family outlined their wishes for the following treatment decisions:  Cardiopulmonary Resuscitation: Do Not Attempt Resuscitation (DNR/No CPR)  Medical Interventions: Limited Additional Interventions: Use medical treatment, IV fluids and cardiac monitoring as indicated, DO NOT USE intubation or mechanical ventilation. May consider use of less invasive airway support such as BiPAP or CPAP. Also provide comfort measures. Transfer to the hospital if indicated. Avoid intensive care.   Antibiotics: Antibiotics if indicated  IV Fluids: IV fluids if indicated  Feeding Tube: No feeding tube   MOST form signed and placed on chart, also scanned into [employer].  Patient tells me if he were ever unable to make decisions for himself he would want his next of kin-daughter Mario [name] to make medical decisions for him.  He tells me he has had recent conversations with her and she understands his wishes.  Discussed  with patient the importance of continued conversation with family and the medical providers regarding overall plan of care and treatment  options, ensuring decisions are within the context of the patients values and GOCs.    We also discussed Mr. [place] ongoing pain.  He tells me pain is uncontrolled with current regimen and limits his function.  We reviewed a plan to increase his OxyContin to 20 mg twice daily as this is what he takes at home.  We discussed adding scheduled Tylenol.  We discussed Oxy IR as needed every 4 hours.  We discussed discontinuing tramadol.  Lidocaine patch was offered for rib cage pain however he declines.  Questions and concerns were addressed. The family was encouraged to call with questions or concerns.  Primary Decision Maker PATIENT  Daughter Mario [name] if patient unable  SUMMARY OF RECOMMENDATIONS   -MOST form completed as above, CODE STATUS changed to DNR -Pain regimen: OxyContin 20 mg twice daily, Oxy IR every 4 hours as needed, scheduled Tylenol 1000 mg 3 times daily -We will refer to outpatient palliative in the cancer center -Patient would want his next of kin: Daughter Mario [name] -to serve as medical decision-maker if he were ever unable -Patient is interested in ongoing follow-up with Dr. Julien Nordmann and pursuing cancer treatment as he is able -Patient agreeable to SNF placement  Code Status/Advance Care Planning: DNR   Discharge Planning: [place] for rehab with Palliative care service follow-up      Primary Diagnoses: Present on Admission:  Pneumonia  OSA (obstructive sleep apnea)  Pleural effusion, left  Non-small cell cancer of left lung (HCC)  Metastatic carcinoma (HCC)  Major depressive disorder, recurrent episode ([place])  Hypertension  History of pulmonary embolism  Chronic respiratory failure with hypoxia ([place])  COPD (chronic obstructive pulmonary disease) ([place])  Rib fractures   I have reviewed the medical record, interviewed the patient and family, and examined the patient. The following aspects are pertinent.  Past Medical History:  Diagnosis Date    Anxiety    Bronchitis    COPD (chronic obstructive pulmonary disease) ([place])    Depression    History of radiation therapy    Left lung- 10/05/20-10/15/20- Dr. Gery Pray   Hypertension    lung ca 09/2020   MI (myocardial infarction) [facility])    ????   OSA (obstructive sleep apnea)    Suicide attempt ([place])    Tension pneumothorax 06/27/2016   Uveitis    Social History   Socioeconomic History   Marital status: Single    Spouse name: Not on file   Number of children: Not on file   Years of education: Not on file   Highest education level: Not on file  Occupational History   Occupation: retired  Tobacco Use   Smoking status: Former    Packs/day: 1.00    Years: 35.00    Total pack years: 35.00    Types: Cigarettes    Quit date: 05/2016    Years since quitting: 5.2   Smokeless tobacco: Never  Vaping Use   Vaping Use: Never used  Substance and Sexual Activity   Alcohol use: No    Alcohol/week: 0.0 standard drinks of alcohol    Comment: denies use of any drugs or alcohol   Drug use: No   Sexual activity: Not on file  Other Topics Concern   Not on file  Social History Narrative   Not on file   Social Determinants of Health   Financial Resource Strain: Low  Risk  (09/23/2020)   Overall Financial Resource Strain (CARDIA)    Difficulty of Paying Living Expenses: Not hard at all  Food Insecurity: Not on file  Transportation Needs: No Transportation Needs (09/23/2020)   PRAPARE - Hydrologist (Medical): No    Lack of Transportation (Non-Medical): No  Physical Activity: Not on file  Stress: No Stress Concern Present (09/23/2020)   [place]    Feeling of Stress : Not at all  Social Connections: Not on file   Family History  Problem Relation Age of Onset   Dementia Father    Scheduled Meds:  acetaminophen  1,000 mg Oral TID   apixaban  5 mg Oral BID   arformoterol  15 mcg  Nebulization BID   aspirin EC  81 mg Oral Daily   [START ON 08/26/2021] azithromycin  500 mg Oral Q24H   budesonide  0.5 mg Inhalation Q2000   buPROPion  300 mg Oral Daily   busPIRone  15 mg Oral Daily   cycloSPORINE  1 drop Both Eyes BID   diltiazem  180 mg Oral Daily   DULoxetine  60 mg Oral BID   gabapentin  300 mg Oral BID   lurasidone  40 mg Oral QPC supper   melatonin  6 mg Oral QHS   mycophenolate  1,500 mg Oral BID   oxcarbazepine  600 mg Oral Daily   oxcarbazepine  900 mg Oral QHS   oxyCODONE  15 mg Oral Q12H   polyethylene glycol  17 g Oral Daily   senna-docusate  1 tablet Oral BID   sodium chloride flush  3 mL Intravenous Q12H   tamsulosin  0.4 mg Oral Daily   traZODone  75 mg Oral QHS   Continuous Infusions:  cefTRIAXone (ROCEPHIN)  IV 2 g (08/25/21 0437)   PRN Meds:.albuterol, guaiFENesin, ondansetron **OR** ondansetron (ZOFRAN) IV, oxyCODONE Allergies  Allergen Reactions   Demerol [Meperidine] Nausea And Vomiting and Other (See Comments)    Made the patient "violently sick"   Zocor [Simvastatin] Nausea And Vomiting and Other (See Comments)    Made him very jittery, also   Beet [Beta Vulgaris] Nausea And Vomiting   Liver Nausea And Vomiting   Review of Systems  Constitutional:  Positive for fatigue.       Uncontrolled pain in left rib cage and lower abdomen  Neurological:  Positive for weakness.    Physical Exam Constitutional:      General: He is not in acute distress.    Appearance: He is ill-appearing.  Pulmonary:     Effort: Pulmonary effort is normal.  Skin:    General: Skin is warm and dry.  Neurological:     Mental Status: He is alert and oriented to person, place, and time.     Vital Signs: BP (!) 153/87 (BP Location: Left Arm)   Pulse 93   Temp 97.7 F (36.5 C) (Oral)   Resp 20   Ht _0  (1.88 m)   Wt (!) 147 kg   SpO2 97%   BMI 41.61 kg/m  Pain Scale: 0-10   Pain Score: 8    SpO2: SpO2: 97 % O2 Device:SpO2: 97 % O2 Flow  Rate: .O2 Flow Rate (L/min): 2 L/min  IO: Intake/output summary:  Intake/Output Summary (Last 24 hours) at 08/25/2021 1030 Last data filed at 08/25/2021 0754 Gross per 24 hour  Intake 930.4 ml  Output 2250 ml  Net -1319.6  ml    LBM: Last BM Date : 08/23/21 Baseline Weight: Weight: (!) 147 kg Most recent weight: Weight: (!) 147 kg     Palliative Assessment/Data: PPS 40%     *Please note that this is a verbal dictation therefore any spelling or grammatical errors are due to the "[place] [facility]" system interpretation.   Juel Burrow, DNP, AGNP-C Palliative Medicine Team [phone] Pager: [phone]

## 2021-08-25 NOTE — TOC Progression Note (Signed)
Transition of Care [facility]) - Progression Note    Patient Details  Name: Mario [name] MRN: [id number] Date of Birth: [birth year]  [facility]) CM/SW [place], LCSW Phone Number: 08/25/2021, 5:09 PM  Clinical Narrative:    Patient agreeable to SNF placement this admission as long as it is not [facility] (recent stay there). CSW submitted clinicals to pasrr for review.    Expected Discharge Plan: Skilled Nursing Facility Barriers to Discharge: Ship broker, SNF Pending bed offer, Awaiting State Approval Forensic scientist)  Expected Discharge Plan and Services Expected Discharge Plan: [place] In-house Referral: Clinical Social Work   Post Acute Care Choice: [place] Living arrangements for the past 2 months: Apartment                                       Social Determinants of Health (SDOH) Interventions    Readmission Risk Interventions    08/23/2021   11:48 AM 08/05/2021   11:02 AM 06/07/2021    4:15 PM  Readmission Risk Prevention Plan  Transportation Screening Complete Complete Complete  Medication Review Press photographer) Complete Complete Complete  PCP or Specialist appointment within 3-5 days of discharge Complete Complete Complete  HRI or Home Care Consult Complete Complete Complete  SW Recovery Care/Counseling Consult Complete Complete Complete  Palliative Care Screening Not Applicable Not Applicable Not [place] Not Applicable Not Applicable Not Applicable

## 2021-08-25 NOTE — Evaluation (Signed)
Occupational Therapy Evaluation Patient Details Name: Mario [name] MRN: [id number] DOB: 05-29-1950 Today's Date: 08/25/2021   History of Present Illness 71 y/o male admitted 6/26 after being discharged the same day, secondary to worsening L chest pain and continued PNA. PMH includes COPD, pleural effusion s/p pleurx catheter, lung cancer, HTN, and PE.   Clinical Impression   PT admitted with PNA. Pt currently with functional limitiations due to the deficits listed below (see OT problem list). Pt demonstrates DOE 3 out 4 with sit to stand with urinal use. Pt with balance deficits and risk for falls. Recommend (A) for transfers at this time.  Pt will benefit from skilled OT to increase their independence and safety with adls and balance to allow discharge SNF.       Recommendations for follow up therapy are one component of a multi-disciplinary discharge planning process, led by the attending physician.  Recommendations may be updated based on patient status, additional functional criteria and insurance authorization.   Follow Up Recommendations  Skilled nursing-short term rehab (<3 hours/day)    Assistance Recommended at Discharge Frequent or constant Supervision/Assistance  Patient can return home with the following A little help with walking and/or transfers;A little help with bathing/dressing/bathroom;Assistance with cooking/housework;Direct supervision/assist for financial management;Assist for transportation;Help with stairs or ramp for entrance;Direct supervision/assist for medications management    Functional Status Assessment  Patient has had a recent decline in their functional status and demonstrates the ability to make significant improvements in function in a reasonable and predictable amount of time.  Equipment Recommendations  None recommended by OT    Recommendations for Other Services       Precautions / Restrictions Precautions Precautions: Fall Precaution  Comments: monitor O2 Restrictions Weight Bearing Restrictions: No      Mobility Bed Mobility Overal bed mobility: Needs Assistance Bed Mobility: Supine to Sit, Sit to Supine     Supine to sit: Supervision     General bed mobility comments: hob elevated    Transfers Overall transfer level: Needs assistance Equipment used: Straight cane Transfers: Sit to/from Stand Sit to Stand: Min guard           General transfer comment: DOE 2 out 4      Balance Overall balance assessment: Needs assistance Sitting-balance support: Feet supported Sitting balance-Leahy Scale: Fair     Standing balance support: Single extremity supported Standing balance-Leahy Scale: Poor Standing balance comment: Reliant on at least 1 UE and external support                           ADL either performed or assessed with clinical judgement   ADL Overall ADL's : Needs assistance/impaired Eating/Feeding: Set up;Sitting   Grooming: Wash/dry face;Wash/dry hands;Set up;Sitting Grooming Details (indicate cue type and reason): EOB Upper Body Bathing: Set up;Sitting Upper Body Bathing Details (indicate cue type and reason): EOB Lower Body Bathing: Moderate assistance;Sit to/from stand;Sitting/lateral leans   Upper Body Dressing : Set up;Sitting   Lower Body Dressing: Moderate assistance;Sit to/from stand;Sitting/lateral leans                 General ADL Comments: pt observed using urinal at eob without DME. Pt without bed alarm on arrival.     Vision Patient Visual Report: No change from baseline       Perception     Praxis      Pertinent Vitals/Pain Pain Assessment Pain Assessment: Faces Faces Pain Scale: Hurts a little  bit Pain Location: L chest Pain Descriptors / Indicators: Guarding, Grimacing Pain Intervention(s): Monitored during session, Repositioned     Hand Dominance Right   Extremity/Trunk Assessment Upper Extremity Assessment Upper Extremity  Assessment: Generalized weakness   Lower Extremity Assessment Lower Extremity Assessment: Defer to PT evaluation   Cervical / Trunk Assessment Cervical / Trunk Assessment: Kyphotic   Communication Communication Communication: No difficulties   Cognition Arousal/Alertness: Awake/alert Behavior During Therapy: WFL for tasks assessed/performed Overall Cognitive Status: Impaired/Different from baseline Area of Impairment: Problem solving, Safety/judgement                         Safety/Judgement: Decreased awareness of safety   Problem Solving: Requires verbal cues, Requires tactile cues General Comments: Decreased safety awareness noted with regards to oxygen tubing     General Comments       Exercises     Shoulder Instructions      Home Living Family/patient expects to be discharged to:: Private residence Living Arrangements: Other (Comment) (roommate) Available Help at Discharge: Friend(s);Available PRN/intermittently (has roommate) Type of Home: Apartment Home Access: Stairs to enter Entrance Stairs-Number of Steps: 3 Entrance Stairs-Rails: Right Home Layout: One level     Bathroom Shower/Tub: Occupational psychologist: Standard Bathroom Accessibility: Yes How Accessible: Accessible via walker Home Equipment: [place] - single point;Shower seat;Rolling Walker (2 wheels)   Additional Comments: wears 3 L O2 at baseline.      Prior Functioning/Environment Prior Level of Function : Independent/Modified Independent             Mobility Comments: Uses cane for ambulation. Uses WC when at MD appointments. ADLs Comments: Able to care for his meds, needs assist with community mobility and iADL.  Increasing difficulties with self care due to balance, weakness and poor acitivity tolerance.        OT Problem List: Decreased activity tolerance;Impaired balance (sitting and/or standing);Decreased safety awareness;Decreased knowledge of  precautions;Decreased knowledge of use of DME or AE      OT Treatment/Interventions: Self-care/ADL training;Therapeutic exercise;Energy conservation;DME and/or AE instruction;Therapeutic activities;Balance training;Patient/family education    OT Goals(Current goals can be found in the care plan section) Acute Rehab OT Goals Patient Stated Goal: wants to be able to go anywhere but [facility] for therapy OT Goal Formulation: With patient Time For Goal Achievement: 09/08/21 Potential to Achieve Goals: Good  OT Frequency: Min 2X/week    Co-evaluation              AM-PAC OT "6 Clicks" Daily Activity     Outcome Measure Help from another person eating meals?: None Help from another person taking care of personal grooming?: A Little Help from another person toileting, which includes using toliet, bedpan, or urinal?: A Little Help from another person bathing (including washing, rinsing, drying)?: A Little Help from another person to put on and taking off regular upper body clothing?: A Little Help from another person to put on and taking off regular lower body clothing?: A Lot 6 Click Score: 18   End of Session Equipment Utilized During Treatment: Oxygen Nurse Communication: Mobility status  Activity Tolerance: Patient limited by fatigue Patient left: in bed;with call bell/phone within reach;with bed alarm set  OT Visit Diagnosis: Unsteadiness on feet (R26.81);Other abnormalities of gait and mobility (R26.89);Muscle weakness (generalized) (M62.81)                Time: 6606-3016 OT Time Calculation (min): 21 min Charges:  OT  General Charges $OT Visit: 1 Visit OT Evaluation $OT Eval Moderate Complexity: 1 Mod   Brynn, OTR/L  Acute Rehabilitation Services Office: [phone] .   Jeri Modena 08/25/2021, 2:51 PM

## 2021-08-25 NOTE — Progress Notes (Signed)
Progress Note    [name]  QIW:[id number] DOB: 1950/10/21  DOA: 08/23/2021 PCP: [facility]      Brief Narrative:    Medical records reviewed and are as summarized below:  Mario [name] is a 71 y.o. 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  He went home and felt fine but after a few hours when he started ambulating he had left-sided pleuritic chest pain which is acute on chronic and shortness of breath, he came back to the ER and was admitted for further care, he now says that he cannot stay by himself and will be agreeable to going to SNF.        Assessment/Plan:   Principal Problem:   Pneumonia Active Problems:   Major depressive disorder, recurrent episode (HCC)   OSA (obstructive sleep apnea)   COPD (chronic obstructive pulmonary disease) (HCC)   Non-small cell cancer of left lung (HCC)   Hypertension   History of pulmonary embolism   Metastatic carcinoma (HCC)   Chronic respiratory failure with hypoxia (HCC)   Pleural effusion, left   Rib fractures    Body mass index is 41.61 kg/m.  (Morbid obesity)   Ongoing pleuritic left-sided chest pain in a patient with history of left-sided rib fractures, Metastatic NSCLC; malignant pleural effusion following with Dr. Lona Millard with left-sided Pleurx catheter - Presented with LLL mass in June 2022 s/p SBRT, then developed malignant pleural effusion in May 2023 with PleurX placed on the left side by IR, following with Dr. Julien Nordmann of oncology who earlier this month gave pt option of hospice referral or palliative systemic chemotherapy  with immune therapy. For now, he wants to continue medical treatment.  Continue Pleurx catheter drain care.     Was seen by IR on 08/22/2021 on last admission, ultrasound of the lung was done for minimal pleural effusion not needing any drainage at that time, as recommended earlier by oncology palliative care will be involved for goals of care. He was evaluated by palliative care team today.  He has decided to be DNR.      2.  Probable pneumonia: CT scan suggested left lower lobe changes appear to be chronic.  Pulmonologist recommended treating for pneumonia for 5 to 7 days.  Continue azithromycin.   3.  COPD, chronic hypoxic respiratory failure : he is on 3 L/min oxygen at home.  Continue bronchodilators as needed.   4.  Generalized weakness and deconditioning.  He has not treated to go to SNF.  Follow-up with social worker to assist with disposition.    5. OSA - CPAP qHS    6. Depression, anxiety - Continue home regimen     7. Chronic pain -continue analgesics as needed.  He requested a lidocaine patch for the back of his neck.   8. Hx of PE - Continue Eliquis       Diet Order             Diet regular Room service appropriate? Yes; Fluid consistency: Thin  Diet effective now  Consultants: Pulmonologist  Procedures: None    Medications:    acetaminophen  1,000 mg Oral TID   apixaban  5 mg Oral BID   arformoterol  15 mcg Nebulization BID   aspirin EC  81 mg Oral Daily   [START ON 08/26/2021] azithromycin  500 mg Oral Q24H   budesonide  0.5 mg Inhalation Q2000   buPROPion  300 mg Oral Daily   busPIRone  15 mg Oral Daily   cycloSPORINE  1 drop Both Eyes BID   diltiazem  180 mg Oral Daily   DULoxetine  60 mg Oral BID   gabapentin  300 mg Oral BID   lurasidone  40 mg Oral QPC supper   melatonin  6 mg Oral QHS   mycophenolate  1,500 mg Oral BID   oxcarbazepine  600 mg Oral Daily   oxcarbazepine  900 mg Oral QHS   oxyCODONE  20  mg Oral Q12H   polyethylene glycol  17 g Oral Daily   senna-docusate  1 tablet Oral BID   sodium chloride flush  3 mL Intravenous Q12H   tamsulosin  0.4 mg Oral Daily   traZODone  75 mg Oral QHS   Continuous Infusions:  cefTRIAXone (ROCEPHIN)  IV 2 g (08/25/21 0437)     Anti-infectives (From admission, onward)    Start     Dose/Rate Route Frequency Ordered Stop   08/26/21 0600  azithromycin (ZITHROMAX) tablet 500 mg        500 mg Oral Every 24 hours 08/25/21 0851 08/29/21 0559   08/24/21 0415  cefTRIAXone (ROCEPHIN) 2 g in sodium chloride 0.9 % 100 mL IVPB        2 g 200 mL/hr over 30 Minutes Intravenous Every 24 hours 08/24/21 0402 08/29/21 0414   08/24/21 0415  azithromycin (ZITHROMAX) 500 mg in sodium chloride 0.9 % 250 mL IVPB  Status:  Discontinued        500 mg 250 mL/hr over 60 Minutes Intravenous Every 24 hours 08/24/21 0402 08/25/21 0850   08/23/21 2345  ceFEPIme (MAXIPIME) 2 g in sodium chloride 0.9 % 100 mL IVPB        2 g 200 mL/hr over 30 Minutes Intravenous  Once 08/23/21 2332 08/24/21 0048   08/23/21 2330  vancomycin (VANCOREADY) IVPB 2000 mg/400 mL        2,000 mg 200 mL/hr over 120 Minutes Intravenous  Once 08/23/21 2332 08/24/21 0302              Family Communication/Anticipated D/C date and plan/Code Status   DVT prophylaxis:  apixaban (ELIQUIS) tablet 5 mg     Code Status: DNR  Family Communication: None Disposition Plan: Plan to discharge to SNF   Status is: Inpatient Remains inpatient appropriate because: Awaiting placement to SNF       Subjective:   Interval events noted.  He complains of pain at the back of his neck.  He requested lidocaine patch for leg pain  Objective:    Vitals:   08/24/21 2354 08/25/21 0334 08/25/21 0755 08/25/21 1649  BP: (!) 150/77 134/70 (!) 153/87 (!) 141/75  Pulse: 80 90 93 93  Resp: 18 17 20 20   Temp: 98 F (36.7 C) 98.2 F (36.8 C) 97.7 F (36.5 C) 97.9 F (36.6 C)  TempSrc: Oral Oral Oral  Oral  SpO2:  97%    Weight:      Height:       No data found.   Intake/Output Summary (Last 24  hours) at 08/25/2021 1739 Last data filed at 08/25/2021 1651 Gross per 24 hour  Intake 1650.4 ml  Output 2050 ml  Net -399.6 ml   Filed Weights   08/23/21 1859  Weight: (!) 147 kg    Exam:  GEN: NAD SKIN: No rash EYES: EOMI ENT: MMM CV: RRR PULM: CTA B ABD: soft, obese, NT, +BS CNS: AAO x 3, non focal EXT: No edema or tenderness MSK: Left lower chest wall tenderness       Data Reviewed:   I have personally reviewed following labs and imaging studies:  Labs: Labs show the following:   Basic Metabolic Panel: Recent Labs  Lab 08/21/21 0609 08/21/21 1159 08/22/21 0048 08/23/21 0311 08/23/21 1901 08/23/21 1920 08/24/21 0416 08/25/21 0225  NA 132*   < > 136 135 134* 134* 137 137  K 5.2*   < > 4.4 3.7 4.7 4.7 3.7 4.0  CL 100   < > 101 101 101  --  101 100  CO2 20*   < > 28 26 23   --  26 26  GLUCOSE 160*   < > 159* 139* 144*  --  103* 110*  BUN 17   < > 14 10 14   --  10 13  CREATININE 1.23   < > 1.01 0.89 0.89  --  0.88 0.95  CALCIUM 9.1   < > 8.2* 8.4* 8.7*  --  9.0 8.9  MG 2.2  --  2.2 2.0  --   --   --  2.1   < > = values in this interval not displayed.   GFR Estimated Creatinine Clearance: 112.2 mL/min (by C-G formula based on SCr of 0.95 mg/dL). Liver Function Tests: Recent Labs  Lab 08/20/21 1825 08/22/21 0048 08/23/21 0311 08/23/21 1901  AST 32 24 18 21   ALT 14 17 15 18   ALKPHOS 80 62 61 72  BILITOT 0.6 0.2* 0.1* 0.3  PROT 6.0* 5.4* 5.6* 6.4*  ALBUMIN 3.1* 2.5* 2.6* 3.1*   No results for input(s): "LIPASE", "AMYLASE" in the last 168 hours. No results for input(s): "AMMONIA" in the last 168 hours. Coagulation profile Recent Labs  Lab 08/20/21 1825  INR 1.1    CBC: Recent Labs  Lab 08/20/21 0005 08/20/21 1825 08/21/21 0609 08/22/21 0048 08/23/21 0311 08/23/21 1901 08/23/21 1920 08/24/21 0416 08/25/21 0225  WBC 16.9* 18.7*   <  > 11.4* 12.1* 13.4*  --  11.6* 9.2  NEUTROABS 13.8* 15.9*  --  8.8* 9.7*  --   --   --   --   HGB 11.8* 10.8*   < > 9.6* 9.7* 11.2* 11.9* 11.1* 10.8*  HCT 37.2* 33.4*   < > 29.7* 31.1* 34.4* 35.0* 35.3* 34.0*  MCV 91.6 90.3   < > 90.5 90.7 90.1  --  90.5 89.9  PLT 617* 549*   < > 445* 437* 487*  --  459* 449*   < > = values in this interval not displayed.   Cardiac Enzymes: No results for input(s): "CKTOTAL", "CKMB", "CKMBINDEX", "TROPONINI" in the last 168 hours. BNP (last 3 results) No results for input(s): "PROBNP" in the last 8760 hours. CBG: No results for input(s): "GLUCAP" in the last 168 hours. D-Dimer: No results for input(s): "DDIMER" in the last 72 hours. Hgb A1c: No results for input(s): "HGBA1C" in the last 72 hours. Lipid Profile: No results for input(s): "CHOL", "HDL", "LDLCALC", "TRIG", "CHOLHDL", "LDLDIRECT" in the last 72 hours. Thyroid function studies: No results for  input(s): "TSH", "T4TOTAL", "T3FREE", "THYROIDAB" in the last 72 hours.  Invalid input(s): "FREET3" Anemia work up: No results for input(s): "VITAMINB12", "FOLATE", "FERRITIN", "TIBC", "IRON", "RETICCTPCT" in the last 72 hours. Sepsis Labs: Recent Labs  Lab 08/20/21 1825 08/20/21 1959 08/21/21 0609 08/22/21 0048 08/23/21 0311 08/23/21 1901 08/24/21 0416 08/25/21 0225  PROCALCITON  --   --    < > <0.10 <0.10  --  <0.10 <0.10  WBC 18.7*  --    < > 11.4* 12.1* 13.4* 11.6* 9.2  LATICACIDVEN 1.3 0.9  --   --   --   --  1.1  --    < > = values in this interval not displayed.    Microbiology Recent Results (from the past 240 hour(s))  Blood Culture (routine x 2)     Status: None   Collection Time: 08/20/21  6:19 PM   Specimen: BLOOD  Result Value Ref Range Status   Specimen Description BLOOD BLOOD RIGHT HAND  Final   Special Requests   Final    BOTTLES DRAWN AEROBIC ONLY Blood Culture results may not be optimal due to an inadequate volume of blood received in culture bottles   Culture    Final    NO GROWTH 5 DAYS Performed at [facility], [place] [address], [place], [place] [address]    Report Status 08/25/2021 FINAL  Final  Blood Culture (routine x 2)     Status: None   Collection Time: 08/20/21  6:59 PM   Specimen: BLOOD  Result Value Ref Range Status   Specimen Description BLOOD BLOOD RIGHT ARM  Final   Special Requests   Final    BOTTLES DRAWN AEROBIC AND ANAEROBIC Blood Culture adequate volume   Culture   Final    NO GROWTH 5 DAYS Performed at [facility], [place] [address], [place], [place] [address]    Report Status 08/25/2021 FINAL  Final  Urine Culture     Status: None   Collection Time: 08/21/21 12:37 AM   Specimen: In/Out Cath Urine  Result Value Ref Range Status   Specimen Description IN/OUT CATH URINE  Final   Special Requests NONE  Final   Culture   Final    NO GROWTH Performed at [facility], [place] [address], [place], [place] [address]    Report Status 08/22/2021 FINAL  Final  Blood culture (routine x 2)     Status: None (Preliminary result)   Collection Time: 08/23/21 11:50 PM   Specimen: BLOOD RIGHT FOREARM  Result Value Ref Range Status   Specimen Description BLOOD RIGHT FOREARM  Final   Special Requests   Final    BOTTLES DRAWN AEROBIC ONLY Blood Culture results may not be optimal due to an inadequate volume of blood received in culture bottles   Culture   Final    NO GROWTH 1 DAY Performed at [facility], [place] [address], [place],  [address]    Report Status PENDING  Incomplete  Blood culture (routine x 2)     Status: None (Preliminary result)   Collection Time: 08/23/21 11:59 PM   Specimen: BLOOD  Result Value Ref Range Status   Specimen Description BLOOD LEFT ANTECUBITAL  Final   Special Requests   Final    BOTTLES DRAWN AEROBIC AND ANAEROBIC Blood Culture adequate volume   Culture   Final    NO GROWTH 1 DAY Performed at [facility], [place],  [place] [address]    Report Status  PENDING  Incomplete    Procedures and diagnostic studies:  CT Angio Chest Pulmonary Embolism (PE) W or WO Contrast  Result Date: 08/23/2021 CLINICAL DATA:  Shortness of breath. EXAM: CT ANGIOGRAPHY CHEST WITH CONTRAST TECHNIQUE: Multidetector CT imaging of the chest was performed using the standard protocol during bolus administration of intravenous contrast. Multiplanar CT image reconstructions and MIPs were obtained to evaluate the vascular anatomy. RADIATION DOSE REDUCTION: This exam was performed according to the departmental dose-optimization program which includes automated exposure control, adjustment of the mA and/or kV according to patient size and/or use of iterative reconstruction technique. CONTRAST:  36mL OMNIPAQUE IOHEXOL 350 MG/ML SOLN COMPARISON:  CT angiogram chest 08/03/2021 FINDINGS: Cardiovascular: Satisfactory opacification of the pulmonary arteries to the segmental level. No evidence of pulmonary embolism. Normal heart size. No pericardial effusion. Mediastinum/Nodes: No enlarged mediastinal, hilar, or axillary lymph nodes. Thyroid gland, trachea, and esophagus demonstrate no significant findings. Lungs/Pleura: There is a partially loculated left pleural effusion which has significantly decreased in size. New percutaneous pleural catheter is seen in the left lower hemithorax. Severe emphysematous changes are again seen including bullae in the lower lobes. There is a new air-fluid level in a bulla in the left lower lobe with overall measurements of 5.3 x 4.8 cm compatible with infection. There is patchy airspace consolidation in the left lower lobe compatible with infection. There are minimal ground-glass opacities in the lingula, new from prior. There are few ill-defined nodular densities in the left upper lobe measuring up to 6 mm image 7/44. There is no evidence for pneumothorax. Trachea and central airways appear patent. Upper Abdomen: No acute abnormality. Musculoskeletal: Left  lateral seventh rib fracture is minimally displaced in similar to the prior examination. There is a new nondisplaced fracture of the left lateral eighth rib. Degenerative changes affect the spine. Review of the MIP images confirms the above findings. IMPRESSION: 1. No evidence for pulmonary embolism. 2. New/acute left eighth rib fracture. Stable left seventh rib fracture. 3. Loculated left pleural effusion has decreased in size. New pleural drainage catheter in place. 4. There is left lower lobe airspace consolidation and ground-glass opacities in the lingula compatible with infection. 5. There is an air-fluid level within the left lower lobe bulla compatible with infection. Emphysema (ICD10-J43.9). Electronically Signed   By: Ronney Asters M.D.   On: 08/23/2021 23:17   DG Chest 1 View  Result Date: 08/23/2021 CLINICAL DATA:  Shortness of breath EXAM: CHEST  1 VIEW COMPARISON:  08/21/2021 FINDINGS: Stable cardiomediastinal contours. Low lung volumes. Persistent left basilar opacity with small left pleural effusion. Air-filled left basilar bulla/cavity is grossly unchanged. Improving aeration of the right lung base. No pneumothorax. IMPRESSION: Persistent left basilar opacity and small left pleural effusion. Improving aeration of the right lung base. Electronically Signed   By: Davina Poke D.O.   On: 08/23/2021 19:19               LOS: 2 days   Maryclaire Stoecker  [facility]   Pager on [email or web address]. If 7PM-7AM, please contact night-coverage at [email or web address]     08/25/2021, 5:39 PM

## 2021-08-26 ENCOUNTER — Ambulatory Visit: Payer: No Typology Code available for payment source | Admitting: Internal Medicine

## 2021-08-26 ENCOUNTER — Other Ambulatory Visit: Payer: No Typology Code available for payment source

## 2021-08-26 ENCOUNTER — Inpatient Hospital Stay (HOSPITAL_COMMUNITY): Payer: No Typology Code available for payment source

## 2021-08-26 DIAGNOSIS — C799 Secondary malignant neoplasm of unspecified site: Secondary | ICD-10-CM

## 2021-08-26 DIAGNOSIS — F332 Major depressive disorder, recurrent severe without psychotic features: Secondary | ICD-10-CM

## 2021-08-26 LAB — BASIC METABOLIC PANEL
Anion gap: 7 (ref 5–15)
BUN: 12 mg/dL (ref 8–23)
CO2: 25 mmol/L (ref 22–32)
Calcium: 8.7 mg/dL — ABNORMAL LOW (ref 8.9–10.3)
Chloride: 103 mmol/L (ref 98–111)
Creatinine, Ser: 0.76 mg/dL (ref 0.61–1.24)
GFR, Estimated: >60 mL/min (ref >60)
Glucose, Bld: 112 mg/dL — ABNORMAL HIGH (ref 70–99)
Potassium: 4.2 mmol/L (ref 3.5–5.1)
Sodium: 135 mmol/L (ref 135–145)

## 2021-08-26 LAB — MAGNESIUM: Magnesium: 2 mg/dL (ref 1.7–2.4)

## 2021-08-26 LAB — CBC
HCT: 34.2 % — ABNORMAL LOW (ref 39.0–52.0)
Hemoglobin: 10.8 g/dL — ABNORMAL LOW (ref 13.0–17.0)
MCH: 28.1 pg (ref 26.0–34.0)
MCHC: 31.6 g/dL (ref 30.0–36.0)
MCV: 89.1 fL (ref 80.0–100.0)
Platelets: 435 10*3/uL — ABNORMAL HIGH (ref 150–400)
RBC: 3.84 MIL/uL — ABNORMAL LOW (ref 4.22–5.81)
RDW: 14.6 % (ref 11.5–15.5)
WBC: 10.1 10*3/uL (ref 4.0–10.5)
nRBC: 0 % (ref 0.0–0.2)

## 2021-08-26 LAB — BRAIN NATRIURETIC PEPTIDE: B Natriuretic Peptide: 31.2 pg/mL (ref 0.0–100.0)

## 2021-08-26 MED ORDER — IBUPROFEN 400 MG PO TABS
400.0000 mg | ORAL_TABLET | Freq: Once | ORAL | Status: AC
Start: 2021-08-26 — End: 2021-08-26
  Administered 2021-08-26: 400 mg via ORAL
  Filled 2021-08-26: qty 1

## 2021-08-26 MED ORDER — CEFDINIR 300 MG PO CAPS
300.0000 mg | ORAL_CAPSULE | Freq: Two times a day (BID) | ORAL | Status: AC
  Administered 2021-08-27 – 2021-08-28 (×4): 300 mg via ORAL
  Filled 2021-08-26 (×4): qty 1

## 2021-08-26 MED ORDER — SENNOSIDES-DOCUSATE SODIUM 8.6-50 MG PO TABS
2.0000 | ORAL_TABLET | Freq: Two times a day (BID) | ORAL | Status: DC
  Administered 2021-08-26 – 2021-09-06 (×17): 2 via ORAL
  Filled 2021-08-26 (×18): qty 2

## 2021-08-26 NOTE — Progress Notes (Signed)
Patient declines CPAP. No unit in room at this time. 

## 2021-08-26 NOTE — TOC Progression Note (Signed)
Transition of Care [facility]) - Progression Note    Patient Details  Name: [name] MRN: [id number] Date of Birth: 1950/03/10  Transition of Care [facility] CM/SW [place], LCSW Phone Number: 08/26/2021, 7:48 PM  Clinical Narrative:    [facility] now unable to accept patient due to [place] will continue SNF search.    Expected Discharge Plan: Skilled Nursing Facility Barriers to Discharge: Ship broker, SNF Pending bed offer, Awaiting State Approval Forensic scientist)  Expected Discharge Plan and Services Expected Discharge Plan: [place] In-house Referral: Clinical Social Work   Post Acute Care Choice: [place] Living arrangements for the past 2 months: Apartment                                       Social Determinants of Health (SDOH) Interventions    Readmission Risk Interventions    08/23/2021   11:48 AM 08/05/2021   11:02 AM 06/07/2021    4:15 PM  Readmission Risk Prevention Plan  Transportation Screening Complete Complete Complete  Medication Review Press photographer) Complete Complete Complete  PCP or Specialist appointment within 3-5 days of discharge Complete Complete Complete  HRI or Home Care Consult Complete Complete Complete  SW Recovery Care/Counseling Consult Complete Complete Complete  Palliative Care Screening Not Applicable Not Applicable Not [place] Not Applicable Not Applicable Not Applicable

## 2021-08-26 NOTE — Progress Notes (Addendum)
Progress Note    Mario [name]  PYP:[id number] DOB: [birth year]  DOA: 08/23/2021 PCP: [facility], [facility]      Brief Narrative:    Medical records reviewed and are as summarized below:  Mario [name] is a 71 y.o. 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  He went home and felt fine but after a few hours when he started ambulating he had left-sided pleuritic chest pain which is acute on chronic and shortness of breath, he came back to the ER and was admitted for further care, he now says that he cannot stay by himself and will be agreeable to going to SNF.        Assessment/Plan:   Principal Problem:   Pneumonia Active Problems:   Major depressive disorder, recurrent episode (HCC)   OSA (obstructive sleep apnea)   COPD (chronic obstructive pulmonary disease) (HCC)   Non-small cell cancer of left lung (HCC)   Hypertension   History of pulmonary embolism   Metastatic carcinoma (HCC)   Chronic respiratory failure with hypoxia (HCC)   Pleural effusion, left   Rib fractures    Body mass index is 41.61 kg/m.  (Morbid obesity)   Ongoing pleuritic left-sided chest pain in a patient with history of left-sided rib fractures, Metastatic NSCLC; malignant pleural effusion following with Dr. Lona Millard with left-sided Pleurx catheter - Presented with LLL mass in June 2022 s/p SBRT, then developed malignant pleural effusion in May 2023 with PleurX placed on the left side by IR, following with Dr. Julien Nordmann of oncology who earlier this month gave pt option of hospice referral or palliative systemic chemotherapy  with immune therapy. For now, he wants to continue medical treatment.  Continue Pleurx catheter drain care.     Was seen by IR on 08/22/2021 on last admission, ultrasound of the lung was done for minimal pleural effusion not needing any drainage at that time, as recommended earlier by oncology palliative care will be involved for goals of care. Follow-up with palliative care team.  Case discussed with Lawrence Santiago, NP, with palliative care team.      2.  Probable pneumonia: CT scan suggested left lower lobe changes appear to be chronic.  Pulmonologist recommended treating for pneumonia for 5 to 7 days.  Change IV azithromycin to oral azithromycin to complete 5 days of treatment on 08/28/2021   3.  COPD, chronic hypoxic respiratory failure : he is on 3 L/min oxygen at home.  Continue bronchodilators as needed.   4.  Generalized weakness and deconditioning.  He has not treated to go to SNF.  Follow-up with social worker to assist with disposition.    5. OSA - CPAP qHS    6. Depression with suicidal thoughts, anxiety -he said he has suicidal thoughts today.  However, he said he has no plans of killing himself.  He believes he does not have much time because of his illness.  Suicide precautions including one-to-one watch have been instituted.  Discussed with Antwanique, RN. Consulted Dr. Lovette Cliche, psychiatrist, to assist with management   7. Chronic pain -continue analgesics as needed including lidocaine patch  8. Hx of PE - Continue Eliquis  Diet Order             Diet regular Room service appropriate? Yes; Fluid consistency: Thin  Diet effective now                            Consultants: Pulmonologist  Procedures: None    Medications:    acetaminophen  1,000 mg Oral TID   apixaban  5 mg Oral BID   arformoterol  15 mcg Nebulization BID   aspirin EC  81 mg Oral Daily   azithromycin  500 mg Oral Q24H   budesonide  0.5 mg Inhalation Q2000   buPROPion  300 mg  Oral Daily   busPIRone  15 mg Oral Daily   [START ON 08/27/2021] cefdinir  300 mg Oral Q12H   cycloSPORINE  1 drop Both Eyes BID   diltiazem  180 mg Oral Daily   DULoxetine  60 mg Oral BID   gabapentin  300 mg Oral BID   lidocaine  1 patch Transdermal Q24H   lurasidone  40 mg Oral QPC supper   melatonin  6 mg Oral QHS   mycophenolate  1,500 mg Oral BID   oxcarbazepine  600 mg Oral Daily   oxcarbazepine  900 mg Oral QHS   oxyCODONE  20 mg Oral Q12H   polyethylene glycol  17 g Oral Daily   senna-docusate  2 tablet Oral BID   sodium chloride flush  3 mL Intravenous Q12H   tamsulosin  0.4 mg Oral Daily   traZODone  75 mg Oral QHS   Continuous Infusions:     Anti-infectives (From admission, onward)    Start     Dose/Rate Route Frequency Ordered Stop   08/27/21 1000  cefdinir (OMNICEF) capsule 300 mg        300 mg Oral Every 12 hours 08/26/21 1136 08/29/21 0959   08/26/21 0600  azithromycin (ZITHROMAX) tablet 500 mg        500 mg Oral Every 24 hours 08/25/21 0851 08/29/21 0559   08/24/21 0415  cefTRIAXone (ROCEPHIN) 2 g in sodium chloride 0.9 % 100 mL IVPB  Status:  Discontinued        2 g 200 mL/hr over 30 Minutes Intravenous Every 24 hours 08/24/21 0402 08/26/21 1135   08/24/21 0415  azithromycin (ZITHROMAX) 500 mg in sodium chloride 0.9 % 250 mL IVPB  Status:  Discontinued        500 mg 250 mL/hr over 60 Minutes Intravenous Every 24 hours 08/24/21 0402 08/25/21 0850   08/23/21 2345  ceFEPIme (MAXIPIME) 2 g in sodium chloride 0.9 % 100 mL IVPB        2 g 200 mL/hr over 30 Minutes Intravenous  Once 08/23/21 2332 08/24/21 0048   08/23/21 2330  vancomycin (VANCOREADY) IVPB 2000 mg/400 mL        2,000 mg 200 mL/hr over 120 Minutes Intravenous  Once 08/23/21 2332 08/24/21 0302              Family Communication/Anticipated D/C date and plan/Code Status   DVT prophylaxis:  apixaban (ELIQUIS) tablet 5 mg     Code Status: DNR  Family Communication: None Disposition  Plan: Plan to discharge to SNF   Status is: Inpatient Remains inpatient appropriate because: Awaiting placement to SNF       Subjective:   Interval events noted.  He said he is having suicidal thoughts but he has no plan of killing himself.  He said he had left a voice message on the palliative care team performed service and requested to speak to someone about how suicidal thoughts he was having.  He insisted that he has no plans of killing himself.  He believes he has limited life expectancy because of his current illness.  Objective:    Vitals:   08/25/21 1928 08/25/21 2001 08/25/21 2352 08/26/21 0857  BP:  (!) 145/55 126/61 (!) 150/71  Pulse:  90 90 89  Resp:  20 20 18   Temp:  97.8 F (36.6 C) 98 F (36.7 C) (!) 97.5 F (36.4 C)  TempSrc:  Oral Oral Oral  SpO2: 98% 97% 98%   Weight:      Height:       No data found.   Intake/Output Summary (Last 24 hours) at 08/26/2021 1156 Last data filed at 08/26/2021 0854 Gross per 24 hour  Intake 1080 ml  Output 1000 ml  Net 80 ml   Filed Weights   08/23/21 1859  Weight: (!) 147 kg    Exam:  GEN: NAD SKIN: No rash EYES: EOMI ENT: MMM CV: RRR PULM: CTA B ABD: soft, obese, NT, +BS CNS: AAO x 3, non focal EXT: No edema or tenderness MSK: Left lower chest wall tenderness       Data Reviewed:   I have personally reviewed following labs and imaging studies:  Labs: Labs show the following:   Basic Metabolic Panel: Recent Labs  Lab 08/21/21 0609 08/21/21 1159 08/22/21 0048 08/23/21 0311 08/23/21 1901 08/23/21 1920 08/24/21 0416 08/25/21 0225 08/26/21 0141  NA 132*   < > 136 135 134* 134* 137 137 135  K 5.2*   < > 4.4 3.7 4.7 4.7 3.7 4.0 4.2  CL 100   < > 101 101 101  --  101 100 103  CO2 20*   < > 28 26 23   --  26 26 25   GLUCOSE 160*   < > 159* 139* 144*  --  103* 110* 112*  BUN 17   < > 14 10 14   --  10 13 12   CREATININE 1.23   < > 1.01 0.89 0.89  --  0.88 0.95 0.76  CALCIUM 9.1   < > 8.2* 8.4*  8.7*  --  9.0 8.9 8.7*  MG 2.2  --  2.2 2.0  --   --   --  2.1 2.0   < > = values in this interval not displayed.   GFR Estimated Creatinine Clearance: 133.2 mL/min (by C-G formula based on SCr of 0.76 mg/dL). Liver Function Tests: Recent Labs  Lab 08/20/21 1825 08/22/21 0048 08/23/21 0311 08/23/21 1901  AST 32 24 18 21   ALT 14 17 15 18   ALKPHOS 80 62 61 72  BILITOT 0.6 0.2* 0.1* 0.3  PROT 6.0* 5.4* 5.6* 6.4*  ALBUMIN 3.1* 2.5* 2.6* 3.1*   No results for input(s): "LIPASE", "AMYLASE" in the last 168 hours. No results for input(s): "AMMONIA" in the last 168 hours. Coagulation profile Recent Labs  Lab 08/20/21 1825  INR 1.1    CBC: Recent Labs  Lab 08/20/21 0005 08/20/21 1825 08/21/21 0609 08/22/21 0048 08/23/21 0311 08/23/21 1901 08/23/21 1920 08/24/21 0416 08/25/21 0225 08/26/21 0141  WBC 16.9* 18.7*   < > 11.4* 12.1* 13.4*  --  11.6* 9.2 10.1  NEUTROABS 13.8* 15.9*  --  8.8* 9.7*  --   --   --   --   --   HGB 11.8*  10.8*   < > 9.6* 9.7* 11.2* 11.9* 11.1* 10.8* 10.8*  HCT 37.2* 33.4*   < > 29.7* 31.1* 34.4* 35.0* 35.3* 34.0* 34.2*  MCV 91.6 90.3   < > 90.5 90.7 90.1  --  90.5 89.9 89.1  PLT 617* 549*   < > 445* 437* 487*  --  459* 449* 435*   < > = values in this interval not displayed.   Cardiac Enzymes: No results for input(s): "CKTOTAL", "CKMB", "CKMBINDEX", "TROPONINI" in the last 168 hours. BNP (last 3 results) No results for input(s): "PROBNP" in the last 8760 hours. CBG: No results for input(s): "GLUCAP" in the last 168 hours. D-Dimer: No results for input(s): "DDIMER" in the last 72 hours. Hgb A1c: No results for input(s): "HGBA1C" in the last 72 hours. Lipid Profile: No results for input(s): "CHOL", "HDL", "LDLCALC", "TRIG", "CHOLHDL", "LDLDIRECT" in the last 72 hours. Thyroid function studies: No results for input(s): "TSH", "T4TOTAL", "T3FREE", "THYROIDAB" in the last 72 hours.  Invalid input(s): "FREET3" Anemia work up: No results for  input(s): "VITAMINB12", "FOLATE", "FERRITIN", "TIBC", "IRON", "RETICCTPCT" in the last 72 hours. Sepsis Labs: Recent Labs  Lab 08/20/21 1825 08/20/21 1959 08/21/21 0609 08/22/21 0048 08/23/21 0311 08/23/21 1901 08/24/21 0416 08/25/21 0225 08/26/21 0141  PROCALCITON  --   --    < > <0.10 <0.10  --  <0.10 <0.10  --   WBC 18.7*  --    < > 11.4* 12.1* 13.4* 11.6* 9.2 10.1  LATICACIDVEN 1.3 0.9  --   --   --   --  1.1  --   --    < > = values in this interval not displayed.    Microbiology Recent Results (from the past 240 hour(s))  Blood Culture (routine x 2)     Status: None   Collection Time: 08/20/21  6:19 PM   Specimen: BLOOD  Result Value Ref Range Status   Specimen Description BLOOD BLOOD RIGHT HAND  Final   Special Requests   Final    BOTTLES DRAWN AEROBIC ONLY Blood Culture results may not be optimal due to an inadequate volume of blood received in culture bottles   Culture   Final    NO GROWTH 5 DAYS Performed at [facility], [place] [address], [place], [place] [address]    Report Status 08/25/2021 FINAL  Final  Blood Culture (routine x 2)     Status: None   Collection Time: 08/20/21  6:59 PM   Specimen: BLOOD  Result Value Ref Range Status   Specimen Description BLOOD BLOOD RIGHT ARM  Final   Special Requests   Final    BOTTLES DRAWN AEROBIC AND ANAEROBIC Blood Culture adequate volume   Culture   Final    NO GROWTH 5 DAYS Performed at [facility], [place] [address] [place], [place] [address]    Report Status 08/25/2021 FINAL  Final  Urine Culture     Status: None   Collection Time: 08/21/21 12:37 AM   Specimen: In/Out Cath Urine  Result Value Ref Range Status   Specimen Description IN/OUT CATH URINE  Final   Special Requests NONE  Final   Culture   Final    NO GROWTH Performed at [facility], [place] [address] [place], [place] [address]    Report Status 08/22/2021 FINAL  Final  Blood culture (routine x 2)     Status: None (Preliminary  result)   Collection Time: 08/23/21 11:50 PM  Specimen: BLOOD RIGHT FOREARM  Result Value Ref Range Status   Specimen Description BLOOD RIGHT FOREARM  Final   Special Requests   Final    BOTTLES DRAWN AEROBIC ONLY Blood Culture results may not be optimal due to an inadequate volume of blood received in culture bottles   Culture   Final    NO GROWTH 2 DAYS Performed at [facility], [place] [address] [place], [place] [address]    Report Status PENDING  Incomplete  Blood culture (routine x 2)     Status: None (Preliminary result)   Collection Time: 08/23/21 11:59 PM   Specimen: BLOOD  Result Value Ref Range Status   Specimen Description BLOOD LEFT ANTECUBITAL  Final   Special Requests   Final    BOTTLES DRAWN AEROBIC AND ANAEROBIC Blood Culture adequate volume   Culture   Final    NO GROWTH 2 DAYS Performed at [facility], [place] [address], [place], [place] [address]    Report Status PENDING  Incomplete    Procedures and diagnostic studies:  No results found.             LOS: 3 days   Cortney Beissel  [facility]   Pager on [email or web address]. If 7PM-7AM, please contact night-coverage at [email or web address]     08/26/2021, 11:56 AM

## 2021-08-26 NOTE — Plan of Care (Signed)
  Problem: Activity: Goal: Ability to tolerate increased activity will improve Outcome: Progressing   Problem: Respiratory: Goal: Ability to maintain adequate ventilation will improve Outcome: Progressing   Problem: Education: Goal: Knowledge of General Education information will improve Description: Including pain rating scale, medication(s)/side effects and non-pharmacologic comfort measures Outcome: Progressing   Problem: Clinical Measurements: Goal: Ability to maintain clinical measurements within normal limits will improve Outcome: Progressing   Problem: Activity: Goal: Risk for activity intolerance will decrease Outcome: Progressing   Problem: Nutrition: Goal: Adequate nutrition will be maintained Outcome: Progressing

## 2021-08-26 NOTE — Consult Note (Signed)
  Mario [name] is a 71 year old male with medical history significant for PD, chronic hypoxic respiratory failure, malignant non-small cell lung cancer, vestibulitis, history of PE on Eliquis, hypertension, chronic pain, malignant pleural effusion, and obstructive sleep apnea on CPAP who was admitted for chest pain and lightheadedness, dehydration, AKI.  Psychiatry was consulted for suicidal thoughts.  As per nursing documentation patient reports" his pain is too much, and he is ready to end it all."   Patient is briefly assessed and pertinent psychiatric information was obtained.  Upon entering the room and introducing self, patient states" this has been blown out of proportion.  I said I wanted to end it, in the same sentence I said I would like to speak to someone.  I was feeling down due to my pain, not thinking it would get better.  I am not suicidal.  I do not have a suicidal plan.  My life has already been shortened due to my cancer, I do not want to shorten it anymore.  That would not be fair to myself or my family.  I just need to talk to someone."   Patient endorses previous psychiatric history of depression and anxiety.  [facility]  He is also receiving cognitive behavioral therapy through the [facility] clinic.  His current outpatient psychotropic medications include gabapentin, duloxetine, Latuda, Wellbutrin; all of which patient reports compliance for.  He feels as though he has a good medication regimen.  He endorses history of 1 suicide attempt by suffocation, which was self aborted 15 years ago.  He currently resides with a roommate.  He identifies his support system as his daughter, granddaughter, unborn granddaughter, sister, and a host of friends.  He identifies as a Engineer, manufacturing.  He denies having any access to weapons.  He denies any family history of mental illness and or suicide attempt or completion.  He  denies any history of substance use and or legal charges.  Today upon evaluation patient is appropriate and alert. He appears to be engaging well with staff and Probation officer. He acknowledges his weaknesses, and how he been able to identify strong coping skills and behavior techniques to help with him get through his pain.  He reports talking and thought processing, are a form of coping skills for him when he is sad and down; which is what he attempted to do on this day.  Overall patient reports effective psychiatric medication regimen, is able to identify appropriate coping skills, and utilization of such.  He identifies no new psychiatric concerns at the time of this evaluation.  With the exception of pain, and sadness he does not identify any acute psychiatric symptoms at this time.  He denies any suicidal ideations, homicidal ideations, and or auditory or visual hallucinations.  He is able to contract for safety at this time.  He does not appear to be responding to internal stimuli.  He is able to contract for safety at this time.   Patient will be psychiatrically cleared at this time.  Patient does not appear to be of imminent danger to self and/or others.  He does not meet criteria for [facility] involuntary commitment. -Discontinue one-to-one [employer] cabin crew, and additional suicide precautions. -TOC for care coordination to the [facility] for increase in therapy.

## 2021-08-26 NOTE — Progress Notes (Addendum)
This RN received call from patient at 09:36. Patient asked to speak with NP Kathie Rhodes, this RN let him know she is unavailable and in a family meeting right now, asked to take a message for NP. Patient then tells this RN that he is experiencing suicidal ideation without a plan. He reports that his pain is "too much" and he is ready to "put an end to it all." This RN asked if he had told anyone else about this, he replied no. This RN shared with the patient that she was going to talk to his care team, he was understanding. This RN immediately walked up to 5W and told bedside RN Antwanique about conversation. Antwanique to reach out to appropriate channels for initiating 1:1 sitter and alerting attending physician. This RN to make NP Kathie Rhodes aware.   Patient again called PMT phone and left voicemail at 10:02. His voice mail states that he "just wanted to talk to someone about the suicidal thoughts, he didn't have a plan, he didn't know why we took all of his things away." This RN reached out to bedside RN to make her aware. PMT RN not going to return call at this time, allowing care team to assist with patient's request.   This RN remains available to assist as able.   Mario Leavell, MSN RN  Palliative Medicine Team  [phone]

## 2021-08-26 NOTE — Progress Notes (Addendum)
Daily Progress Note   Patient Name: Mario [name]       Date: 08/26/2021 DOB: Feb 08, 1951  Age: 71 y.o. MRN#: [id number] Attending Physician: Mario Boroughs, MD Primary Care Physician: [facility], [name] Admit Date: 08/23/2021  Reason for Consultation/Follow-up: Establishing goals of care  Subjective: Pain better controlled, slept well last night  Length of Stay: 3  Current Medications: Scheduled Meds:   acetaminophen  1,000 mg Oral TID   apixaban  5 mg Oral BID   arformoterol  15 mcg Nebulization BID   aspirin EC  81 mg Oral Daily   azithromycin  500 mg Oral Q24H   budesonide  0.5 mg Inhalation Q2000   buPROPion  300 mg Oral Daily   busPIRone  15 mg Oral Daily   cycloSPORINE  1 drop Both Eyes BID   diltiazem  180 mg Oral Daily   DULoxetine  60 mg Oral BID   gabapentin  300 mg Oral BID   lidocaine  1 patch Transdermal Q24H   lurasidone  40 mg Oral QPC supper   melatonin  6 mg Oral QHS   mycophenolate  1,500 mg Oral BID   oxcarbazepine  600 mg Oral Daily   oxcarbazepine  900 mg Oral QHS   oxyCODONE  20 mg Oral Q12H   polyethylene glycol  17 g Oral Daily   senna-docusate  1 tablet Oral BID   sodium chloride flush  3 mL Intravenous Q12H   tamsulosin  0.4 mg Oral Daily   traZODone  75 mg Oral QHS    Continuous Infusions:  cefTRIAXone (ROCEPHIN)  IV 2 g (08/26/21 0401)    PRN Meds: albuterol, guaiFENesin, ondansetron **OR** ondansetron (ZOFRAN) IV, oxyCODONE  Physical Exam Constitutional:      General: He is not in acute distress.    Appearance: He is ill-appearing.  Pulmonary:     Effort: Pulmonary effort is normal.  Skin:    General: Skin is warm and dry.  Neurological:     Mental Status: He is alert and oriented to person, place, and time.             Vital Signs:  BP 126/61 (BP Location: Right Arm)   Pulse 90   Temp 98 F (36.7 C) (Oral)   Resp 20   Ht _0  (1.88 m)   Wt (!) 147 kg   SpO2 98%   BMI 41.61 kg/m  SpO2: SpO2: 98 % O2 Device: O2 Device: Nasal Cannula O2 Flow Rate: O2 Flow Rate (L/min): 2 L/min  Intake/output summary:  Intake/Output Summary (Last 24 hours) at 08/26/2021 0849 Last data filed at 08/25/2021 1824 Gross per 24 hour  Intake 1140 ml  Output 700 ml  Net 440 ml   LBM: Last BM Date : 08/23/21 Baseline Weight: Weight: (!) 147 kg Most recent weight: Weight: (!) 147 kg       Palliative Assessment/Data: PPS 40%      Patient Active Problem List   Diagnosis Date Noted   Rib fractures 08/24/2021   Pleural effusion, left 08/20/2021   Metastatic carcinoma ([place]) 08/07/2021   Non-small cell lung cancer, left ([place]) 08/07/2021   Chronic respiratory failure with hypoxia ([place]) 08/07/2021  Rib pain 08/03/2021   Encounter for antineoplastic chemotherapy 07/20/2021   Leg paresthesia 06/17/2021   Sepsis ([place]) 06/17/2021   Obesity, Class III, BMI 40-49.9 (morbid obesity) ([place]) 06/17/2021   Pneumonia 06/17/2021   Hypertension    Allergic rhinitis    History of pulmonary embolism    Hyponatremia 05/20/2021   Mood disorder ([place]) 02/03/2021   Non-small cell cancer of left lung ([place]) 09/21/2020   Lung nodule 07/30/2020   Mass of left lung    Constipation    HCAP (healthcare-associated pneumonia) 04/03/2020   COPD with acute exacerbation ([place]) 01/05/2020   Vertigo 12/26/2019   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   Stage 3a chronic kidney disease ([place]) 12/26/2019   Abnormal CT of the chest 12/26/2019   COPD (chronic obstructive pulmonary disease) ([place])    Hyperglycemia 06/23/2019   OSA (obstructive sleep apnea) 06/23/2019   Major depressive disorder, recurrent episode ([place]) 10/12/2018   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   Cellulitis of both feet 07/16/2017   DM2 (diabetes  mellitus, type 2) ([place]) 07/16/2017   HLA B27 (HLA B27 positive) 07/16/2017   Hyperkalemia    Hypoxia    Dyspnea 05/23/2014   COPD exacerbation ([place]) 05/23/2014   Malingering 01/21/2013   Depression 01/11/2013   Tobacco abuse 01/11/2013   Obesity, unspecified 01/11/2013   Hypertension associated with diabetes ([place]) 01/10/2013    Palliative Care Assessment & Plan   HPI: 71 y.o. male  with past medical history of COPD, malignant non-small cell lung cancer, vasculitis, PE on Eliquis, hypertension, chronic pain, malignant pleural effusion with Pleurx, OSA, and multiple hospitalizations admitted on 08/23/2021 with increased shortness of breath.  Of note, patient just discharged 3 to 4 hours prior to readmission.  Patient with ongoing pleuritic left-sided chest pain likely related to history of left-sided rib fractures related to a fall and also metastatic non-small cell lung cancer.  Also has malignant pleural effusion and Pleurx catheter on left-side of chest.  PMT consulted to discuss goals of care.   Of note, patient with 8 admissions and 2 ED visits in past 6 months.  Assessment: Pain better controlled on current pain regimen -patient agrees to continue. Continuing to pursue SNF. No BM since 6/26 -taking MiraLAX and senna.  Will increase senna.  Recommendations/Plan: Continue current pain regimen: OxyContin 20 mg twice daily, Oxy IR every 4 hours as needed, scheduled Tylenol 1000 mg 3 times daily, lidocaine patch Increase senna dose, continue MiraLAX Referred to outpatient palliative and cancer center MOST form completed as above, CODE STATUS changed to DNR -Patient would want his next of kin: Daughter Mario [name] -to serve as medical decision-maker if he were ever unable -Patient is interested in ongoing follow-up with Dr. Julien [name] and pursuing cancer treatment as he is able -Patient agreeable to SNF placement   Addendum 10:45 AM: Received report from PMT RN that patient had called our  phone requesting to speak to me regarding his suicidal thoughts. Prior to seeing patient, I spoke with Dr. Mal [name] outside patient's room - he plans to consult psychiatry and order suicide precautions. When I entered room patient had sitter present. Sitter stepped out to allow for privacy with discussion.  Patient informs me he feels that current interventions are a bit too extreme as he has no plan to commit suicide.  He tells me he was only trying to express that he is having passing suicidal thoughts.  He tells me he understands his life expectancy is already  limited related to his medical diagnoses and he would not want to limit that time any further as he feels this would be unfair to his family.  We discussed the medical staff's desire to keep him safe and that is the reason for the suicide precautions at this point.  We discussed that his attending physician has consulted psychiatry.  He agrees with me that this could be helpful.  He tells me he feels very down.  We discussed he is on some medications for his mood but maybe this regimen could be improved.  We discussed that of course much of his mood is due to current situation -poor prognosis and now need for rehab placement.  He confirms with me that his goals continue to be to go to rehab to try to regain some strength and stay out of the hospital so that he can pursue further treatment with Dr. Julien [name] in the cancer center.  He also shares he thinks some of his mood is related to uncontrolled pain.  We again reviewed his pain regimen that we initiated yesterday.  We review he has not taken much PRN meds and that these are available to him -he is going to try the Oxy IR, requested nurse to administer dose now.  Towards the end of conversation participated in life review with patient -he tells me some comical stories of previous jobs he has worked.  He is open to speaking with psych about his mood and suicidal thoughts.  He understands current suicide  precautions.   Code Status: DNR  Discharge Planning: [facility] for rehab with Palliative care service follow-up  Care plan was discussed with patient and RN  Thank you for allowing the Palliative Medicine Team to assist in the care of this patient.   *Please note that this is a verbal dictation therefore any spelling or grammatical errors are due to the "[place] [employer]" system interpretation.  Juel Burrow, DNP, [facility] Palliative Medicine Team Team Phone # [phone]  Pager [phone]

## 2021-08-26 NOTE — Progress Notes (Signed)
Occupational Therapy Treatment Patient Details Name: Mario [name] MRN: [id number] DOB: [birth year] Today's Date: 08/26/2021   History of present illness 71 y/o male admitted 6/26 after being discharged the same day, secondary to worsening L chest pain and continued PNA. PMH includes COPD, pleural effusion s/p pleurx catheter, lung cancer, HTN, and PE.   OT comments  Pt making good progress with OT goals. He was able to complete ADL's and functional mobility using a cane, with min guard. He continues to demonstrate poor activity tolerance and requires rest breaks. Continuing to recommend SNF. OT will continue to follow acutely.    Recommendations for follow up therapy are one component of a multi-disciplinary discharge planning process, led by the attending physician.  Recommendations may be updated based on patient status, additional functional criteria and insurance authorization.    Follow Up Recommendations  Skilled nursing-short term rehab (<3 hours/day)    Assistance Recommended at Discharge Frequent or constant Supervision/Assistance  Patient can return home with the following  A little help with walking and/or transfers;A little help with bathing/dressing/bathroom;Assistance with cooking/housework;Direct supervision/assist for financial management;Assist for transportation;Help with stairs or ramp for entrance;Direct supervision/assist for medications management   Equipment Recommendations  None recommended by OT    Recommendations for Other Services      Precautions / Restrictions Precautions Precautions: Fall Precaution Comments: monitor O2 Restrictions Weight Bearing Restrictions: No       Mobility Bed Mobility               General bed mobility comments: Up at EOB    Transfers Overall transfer level: Needs assistance Equipment used: Straight cane Transfers: Sit to/from Stand Sit to Stand: Min guard           General transfer comment: Min guard for  safety     Balance Overall balance assessment: Needs assistance Sitting-balance support: Feet supported Sitting balance-Leahy Scale: Good     Standing balance support: Single extremity supported Standing balance-Leahy Scale: Poor Standing balance comment: Reliant on 1 UE and unable to tolerate a challenge                           ADL either performed or assessed with clinical judgement   ADL Overall ADL's : Needs assistance/impaired     Grooming: Wash/dry hands;Wash/dry face;Oral care;Min guard;Standing Grooming Details (indicate cue type and reason): completed at sink                 Toilet Transfer: Min guard;Ambulation Toilet Transfer Details (indicate cue type and reason): Used cane for mobility, no assist needed Toileting- Clothing Manipulation and Hygiene: Supervision/safety;Sitting/lateral lean;Sit to/from stand Toileting - Clothing Manipulation Details (indicate cue type and reason): Reports at home having to prop up 1LE to reach behind and complete pericare     Functional mobility during ADLs: Min guard;Cane General ADL Comments: Pt much improved with mobility this session, able to mobilize and complete ADL's without the cane.    Extremity/Trunk Assessment              Vision       Perception     Praxis      Cognition Arousal/Alertness: Awake/alert Behavior During Therapy: Flat affect, WFL for tasks assessed/performed Overall Cognitive Status: Within Functional Limits for tasks assessed  General Comments: Appears WFL this session, able to manage O2 line and male purewik line with no difficulties        Exercises      Shoulder Instructions       General Comments VSS on RA    Pertinent Vitals/ Pain       Pain Assessment Pain Assessment: Faces Faces Pain Scale: Hurts little more Pain Location: L chest Pain Descriptors / Indicators: Guarding, Grimacing Pain Intervention(s):  Monitored during session, Repositioned  Home Living                                          Prior Functioning/Environment              Frequency  Min 2X/week        Progress Toward Goals  OT Goals(current goals can now be found in the care plan section)  Progress towards OT goals: Progressing toward goals  Acute Rehab OT Goals Patient Stated Goal: Go home OT Goal Formulation: With patient Time For Goal Achievement: 09/08/21 Potential to Achieve Goals: Good ADL Goals Pt Will Perform Grooming: with set-up;standing Additional ADL Goal #1: pt will demonstrate 2 energy conservation strategies with adl sink level Additional ADL Goal #2: pt will complete bed mobility mod I with HOB 20 degrees or less  Plan Discharge plan remains appropriate;Frequency remains appropriate    Co-evaluation                 AM-PAC OT "6 Clicks" Daily Activity     Outcome Measure   Help from another person eating meals?: None Help from another person taking care of personal grooming?: A Little Help from another person toileting, which includes using toliet, bedpan, or urinal?: A Little Help from another person bathing (including washing, rinsing, drying)?: A Little Help from another person to put on and taking off regular upper body clothing?: A Little Help from another person to put on and taking off regular lower body clothing?: A Lot 6 Click Score: 18    End of Session Equipment Utilized During Treatment: Oxygen  OT Visit Diagnosis: Unsteadiness on feet (R26.81);Other abnormalities of gait and mobility (R26.89);Muscle weakness (generalized) (M62.81)   Activity Tolerance Patient tolerated treatment well   Patient Left in bed;with call bell/phone within reach;with nursing/sitter in room   Nurse Communication Mobility status        Time: 1216-1229 OT Time Calculation (min): 13 min  Charges: OT General Charges $OT Visit: 1 Visit OT Treatments $Self  Care/Home Management : 8-22 mins  Jordynn Marcella H., OTR/L Acute Rehabilitation  Liisa Picone Elane Yolanda Bonine 08/26/2021, 4:15 PM

## 2021-08-27 DIAGNOSIS — S2242XD Multiple fractures of ribs, left side, subsequent encounter for fracture with routine healing: Secondary | ICD-10-CM

## 2021-08-27 NOTE — Plan of Care (Signed)
  Problem: Activity: Goal: Ability to tolerate increased activity will improve Outcome: Progressing   Problem: Education: Goal: Knowledge of General Education information will improve Description: Including pain rating scale, medication(s)/side effects and non-pharmacologic comfort measures Outcome: Progressing   Problem: Activity: Goal: Risk for activity intolerance will decrease Outcome: Progressing   Problem: Nutrition: Goal: Adequate nutrition will be maintained Outcome: Progressing

## 2021-08-27 NOTE — Progress Notes (Addendum)
Progress Note    Mario [name]  MHD:[id number] DOB: 18-Oct-1950  DOA: 08/23/2021 PCP: [facility]      Brief Narrative:    Medical records reviewed and are as summarized below:  Mario [name] is a 71 y.o. 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  He went home and felt fine but after a few hours when he started ambulating he had left-sided pleuritic chest pain which is acute on chronic and shortness of breath, he came back to the ER and was admitted for further care, he now says that he cannot stay by himself and will be agreeable to going to SNF.  Pneumonia was suspected on CT chest.  Pulmonologist was consulted and he recommended treating for pneumonia.  He expressed suicidal thoughts which prompted initiation of suicide precautions and a psychiatry consult.  He has been cleared by the psychiatrist and suicide precautions have been discontinued.  Palliative care team was consulted to assist with pain management.    Assessment/Plan:   Principal Problem:   Pneumonia Active Problems:   Major depressive disorder, recurrent episode (HCC)   OSA (obstructive sleep apnea)   COPD (chronic obstructive pulmonary disease) (HCC)   Non-small cell cancer of left lung (HCC)   Hypertension   History of pulmonary embolism   Metastatic carcinoma (HCC)   Chronic respiratory failure with hypoxia (HCC)   Pleural effusion, left   Rib fractures    Body mass index is 41.61 kg/m.  (Morbid obesity)   Ongoing pleuritic left-sided chest pain in a patient with history of left-sided rib fractures, Metastatic  NSCLC; malignant pleural effusion following with Dr. Lona Millard with left-sided Pleurx catheter - Presented with LLL mass in June 2022 s/p SBRT, then developed malignant pleural effusion in May 2023 with PleurX placed on the left side by IR, following with Dr. Julien Nordmann of oncology who earlier this month gave pt option of hospice referral or palliative systemic chemotherapy with immune therapy. For now, he wants to continue medical treatment.  Continue Pleurx catheter drain care.     Was seen by IR on 08/22/2021 on last admission, ultrasound of the lung was done for minimal pleural effusion not needing any drainage at that time, as recommended earlier by oncology palliative care will be involved for goals of care. Follow-up with palliative care team.  Case discussed with Lawrence Santiago, NP, with palliative care team.      2.  Probable pneumonia: CT scan suggested left lower lobe changes appear to be chronic.  Pulmonologist recommended treating for pneumonia for 5 to 7 days.  Change IV azithromycin to oral azithromycin to complete 5 days of treatment on 08/28/2021   3.  COPD, chronic hypoxic respiratory failure : he is on 3 L/min oxygen at home.  Continue bronchodilators as needed.   4.  Generalized weakness and deconditioning.  He has not treated to go to SNF.  Follow-up with social worker to assist with disposition.    5. OSA - CPAP qHS    6. Depression with suicidal thoughts, anxiety -he was evaluated by the psychiatrist.  No indication for involuntary commitment.  Per psychiatrist, okay to discontinue one-to-one sitter and  suicide precautions.  Continue psychotropics.   7. Chronic pain -continue analgesics as needed including lidocaine patch  8. Hx of PE - Continue Eliquis       Diet Order             Diet regular Room service appropriate? Yes; Fluid consistency: Thin  Diet effective now                            Consultants: Pulmonologist  Procedures: None    Medications:     acetaminophen  1,000 mg Oral TID   apixaban  5 mg Oral BID   arformoterol  15 mcg Nebulization BID   aspirin EC  81 mg Oral Daily   azithromycin  500 mg Oral Q24H   budesonide  0.5 mg Inhalation Q2000   buPROPion  300 mg Oral Daily   busPIRone  15 mg Oral Daily   cefdinir  300 mg Oral Q12H   cycloSPORINE  1 drop Both Eyes BID   diltiazem  180 mg Oral Daily   DULoxetine  60 mg Oral BID   gabapentin  300 mg Oral BID   lidocaine  1 patch Transdermal Q24H   lurasidone  40 mg Oral QPC supper   melatonin  6 mg Oral QHS   mycophenolate  1,500 mg Oral BID   oxcarbazepine  600 mg Oral Daily   oxcarbazepine  900 mg Oral QHS   oxyCODONE  20 mg Oral Q12H   polyethylene glycol  17 g Oral Daily   senna-docusate  2 tablet Oral BID   sodium chloride flush  3 mL Intravenous Q12H   tamsulosin  0.4 mg Oral Daily   traZODone  75 mg Oral QHS   Continuous Infusions:     Anti-infectives (From admission, onward)    Start     Dose/Rate Route Frequency Ordered Stop   08/27/21 1000  cefdinir (OMNICEF) capsule 300 mg        300 mg Oral Every 12 hours 08/26/21 1136 08/29/21 0959   08/26/21 0600  azithromycin (ZITHROMAX) tablet 500 mg        500 mg Oral Every 24 hours 08/25/21 0851 08/29/21 0559   08/24/21 0415  cefTRIAXone (ROCEPHIN) 2 g in sodium chloride 0.9 % 100 mL IVPB  Status:  Discontinued        2 g 200 mL/hr over 30 Minutes Intravenous Every 24 hours 08/24/21 0402 08/26/21 1135   08/24/21 0415  azithromycin (ZITHROMAX) 500 mg in sodium chloride 0.9 % 250 mL IVPB  Status:  Discontinued        500 mg 250 mL/hr over 60 Minutes Intravenous Every 24 hours 08/24/21 0402 08/25/21 0850   08/23/21 2345  ceFEPIme (MAXIPIME) 2 g in sodium chloride 0.9 % 100 mL IVPB        2 g 200 mL/hr over 30 Minutes Intravenous  Once 08/23/21 2332 08/24/21 0048   08/23/21 2330  vancomycin (VANCOREADY) IVPB 2000 mg/400 mL        2,000 mg 200 mL/hr over 120 Minutes Intravenous  Once 08/23/21 2332 08/24/21 0302               Family Communication/Anticipated D/C date and plan/Code Status   DVT prophylaxis:  apixaban (ELIQUIS) tablet 5 mg     Code Status: DNR  Family Communication: None Disposition Plan: Plan to discharge to SNF   Status is: Inpatient Remains inpatient appropriate because: Awaiting placement to SNF  Subjective:   Interval events noted.  He still has pain in the left lower chest around the ribs radiating across the mid chest.  He is not suicidal.  He has a sitter at the bedside.  Objective:    Vitals:   08/27/21 0238 08/27/21 0759 08/27/21 0815 08/27/21 1157  BP:   139/66   Pulse:   81   Resp: 20  18 20   Temp:   97.9 F (36.6 C) 97.8 F (36.6 C)  TempSrc:   Oral Axillary  SpO2:  97% 97%   Weight:      Height:       No data found.   Intake/Output Summary (Last 24 hours) at 08/27/2021 1523 Last data filed at 08/27/2021 0920 Gross per 24 hour  Intake 540 ml  Output 820 ml  Net -280 ml   Filed Weights   08/23/21 1859  Weight: (!) 147 kg    Exam:  GEN: NAD SKIN: No rash EYES: EOMI ENT: MMM CV: RRR PULM: CTA B ABD: soft, obese, NT, +BS CNS: AAO x 3, non focal EXT: No edema or tenderness MSK: Left lower chest wall tenderness PSYCH: Mood is low but he is calm and cooperative       Data Reviewed:   I have personally reviewed following labs and imaging studies:  Labs: Labs show the following:   Basic Metabolic Panel: Recent Labs  Lab 08/21/21 0609 08/21/21 1159 08/22/21 0048 08/23/21 0311 08/23/21 1901 08/23/21 1920 08/24/21 0416 08/25/21 0225 08/26/21 0141  NA 132*   < > 136 135 134* 134* 137 137 135  K 5.2*   < > 4.4 3.7 4.7 4.7 3.7 4.0 4.2  CL 100   < > 101 101 101  --  101 100 103  CO2 20*   < > 28 26 23   --  26 26 25   GLUCOSE 160*   < > 159* 139* 144*  --  103* 110* 112*  BUN 17   < > 14 10 14   --  10 13 12   CREATININE 1.23   < > 1.01 0.89 0.89  --  0.88 0.95 0.76  CALCIUM 9.1   < > 8.2* 8.4* 8.7*  --   9.0 8.9 8.7*  MG 2.2  --  2.2 2.0  --   --   --  2.1 2.0   < > = values in this interval not displayed.   GFR Estimated Creatinine Clearance: 133.2 mL/min (by C-G formula based on SCr of 0.76 mg/dL). Liver Function Tests: Recent Labs  Lab 08/20/21 1825 08/22/21 0048 08/23/21 0311 08/23/21 1901  AST 32 24 18 21   ALT 14 17 15 18   ALKPHOS 80 62 61 72  BILITOT 0.6 0.2* 0.1* 0.3  PROT 6.0* 5.4* 5.6* 6.4*  ALBUMIN 3.1* 2.5* 2.6* 3.1*   No results for input(s): "LIPASE", "AMYLASE" in the last 168 hours. No results for input(s): "AMMONIA" in the last 168 hours. Coagulation profile Recent Labs  Lab 08/20/21 1825  INR 1.1    CBC: Recent Labs  Lab 08/20/21 1825 08/21/21 0609 08/22/21 0048 08/23/21 0311 08/23/21 1901 08/23/21 1920 08/24/21 0416 08/25/21 0225 08/26/21 0141  WBC 18.7*   < > 11.4* 12.1* 13.4*  --  11.6* 9.2 10.1  NEUTROABS 15.9*  --  8.8* 9.7*  --   --   --   --   --   HGB 10.8*   < > 9.6* 9.7* 11.2* 11.9* 11.1* 10.8* 10.8*  HCT 33.4*   < >  29.7* 31.1* 34.4* 35.0* 35.3* 34.0* 34.2*  MCV 90.3   < > 90.5 90.7 90.1  --  90.5 89.9 89.1  PLT 549*   < > 445* 437* 487*  --  459* 449* 435*   < > = values in this interval not displayed.   Cardiac Enzymes: No results for input(s): "CKTOTAL", "CKMB", "CKMBINDEX", "TROPONINI" in the last 168 hours. BNP (last 3 results) No results for input(s): "PROBNP" in the last 8760 hours. CBG: No results for input(s): "GLUCAP" in the last 168 hours. D-Dimer: No results for input(s): "DDIMER" in the last 72 hours. Hgb A1c: No results for input(s): "HGBA1C" in the last 72 hours. Lipid Profile: No results for input(s): "CHOL", "HDL", "LDLCALC", "TRIG", "CHOLHDL", "LDLDIRECT" in the last 72 hours. Thyroid function studies: No results for input(s): "TSH", "T4TOTAL", "T3FREE", "THYROIDAB" in the last 72 hours.  Invalid input(s): "FREET3" Anemia work up: No results for input(s): "VITAMINB12", "FOLATE", "FERRITIN", "TIBC", "IRON",  "RETICCTPCT" in the last 72 hours. Sepsis Labs: Recent Labs  Lab 08/20/21 1825 08/20/21 1959 08/21/21 0609 08/22/21 0048 08/23/21 0311 08/23/21 1901 08/24/21 0416 08/25/21 0225 08/26/21 0141  PROCALCITON  --   --    < > <0.10 <0.10  --  <0.10 <0.10  --   WBC 18.7*  --    < > 11.4* 12.1* 13.4* 11.6* 9.2 10.1  LATICACIDVEN 1.3 0.9  --   --   --   --  1.1  --   --    < > = values in this interval not displayed.    Microbiology Recent Results (from the past 240 hour(s))  Blood Culture (routine x 2)     Status: None   Collection Time: 08/20/21  6:19 PM   Specimen: BLOOD  Result Value Ref Range Status   Specimen Description BLOOD BLOOD RIGHT HAND  Final   Special Requests   Final    BOTTLES DRAWN AEROBIC ONLY Blood Culture results may not be optimal due to an inadequate volume of blood received in culture bottles   Culture   Final    NO GROWTH 5 DAYS Performed at [facility], [place] [address] [place], [place] [address]    Report Status 08/25/2021 FINAL  Final  Blood Culture (routine x 2)     Status: None   Collection Time: 08/20/21  6:59 PM   Specimen: BLOOD  Result Value Ref Range Status   Specimen Description BLOOD BLOOD RIGHT ARM  Final   Special Requests   Final    BOTTLES DRAWN AEROBIC AND ANAEROBIC Blood Culture adequate volume   Culture   Final    NO GROWTH 5 DAYS Performed at [facility], [place] [address] [place], [place] [address]    Report Status 08/25/2021 FINAL  Final  Urine Culture     Status: None   Collection Time: 08/21/21 12:37 AM   Specimen: In/Out Cath Urine  Result Value Ref Range Status   Specimen Description IN/OUT CATH URINE  Final   Special Requests NONE  Final   Culture   Final    NO GROWTH Performed at [facility], [place] [address] [place], [place] [address]    Report Status 08/22/2021 FINAL  Final  Blood culture (routine x 2)     Status: None (Preliminary result)   Collection Time: 08/23/21 11:50 PM   Specimen: BLOOD  RIGHT FOREARM  Result Value Ref Range Status   Specimen Description BLOOD RIGHT FOREARM  Final  Special Requests   Final    BOTTLES DRAWN AEROBIC ONLY Blood Culture results may not be optimal due to an inadequate volume of blood received in culture bottles   Culture   Final    NO GROWTH 3 DAYS Performed at [facility], [place] [address], [place], [place] [address]    Report Status PENDING  Incomplete  Blood culture (routine x 2)     Status: None (Preliminary result)   Collection Time: 08/23/21 11:59 PM   Specimen: BLOOD  Result Value Ref Range Status   Specimen Description BLOOD LEFT ANTECUBITAL  Final   Special Requests   Final    BOTTLES DRAWN AEROBIC AND ANAEROBIC Blood Culture adequate volume   Culture   Final    NO GROWTH 3 DAYS Performed at [facility], [place] [address], [place], [place] [address]    Report Status PENDING  Incomplete    Procedures and diagnostic studies:  DG Lumbar Spine 2-3 Views  Result Date: 08/26/2021 CLINICAL DATA:  Injury. EXAM: LUMBAR SPINE - 2-3 VIEW COMPARISON:  Lumbar spine x-ray 07/22/2020 FINDINGS: There is no evidence of lumbar spine fracture. Alignment is normal. Degenerative disc space changes appear similar to the prior examination with endplate osteophytes and sclerosis. This is most significant at L4-L5, unchanged. IMPRESSION: 1. No acute fracture or malalignment. 2. Stable degenerative changes. Electronically Signed   By: Ronney Asters M.D.   On: 08/26/2021 18:08               LOS: 4 days   Saylah Ketner  [facility]   Pager on [email or web address]. If 7PM-7AM, please contact night-coverage at [email or web address]     08/27/2021, 3:23 PM

## 2021-08-27 NOTE — Progress Notes (Signed)
Physical Therapy Treatment Patient Details Name: Mario [name] MRN: [id number] DOB: 1950/03/04 Today's Date: 08/27/2021   History of Present Illness 71 y/o male admitted 6/26 after being discharged the same day, secondary to worsening L chest pain and continued PNA. PMH includes COPD, pleural effusion s/p pleurx catheter, lung cancer, HTN, and PE.    PT Comments    Pt received in supine, agreeable to therapy session with encouragement, with improved activity tolerance compared with previous session. Pt needing up to min guard for transfers and short distance gait tasks in room, pt quick to fatigue and reports 5/10 modified RPE after short household distance gait trial. SpO2 90-93% on 3L O2 [place]. Pt continues to benefit from PT services to progress toward functional mobility goals.    Recommendations for follow up therapy are one component of a multi-disciplinary discharge planning process, led by the attending physician.  Recommendations may be updated based on patient status, additional functional criteria and insurance authorization.  Follow Up Recommendations  Skilled nursing-short term rehab (<3 hours/day)     Assistance Recommended at Discharge Frequent or constant Supervision/Assistance  Patient can return home with the following A little help with walking and/or transfers;A little help with bathing/dressing/bathroom;Assistance with cooking/housework;Help with stairs or ramp for entrance;Assist for transportation   Equipment Recommendations  None recommended by PT    Recommendations for Other Services       Precautions / Restrictions Precautions Precautions: Fall Precaution Comments: monitor O2 Restrictions Weight Bearing Restrictions: No     Mobility  Bed Mobility Overal bed mobility: Modified Independent Bed Mobility: Supine to Sit     Supine to sit: HOB elevated, Modified independent (Device/Increase time)     General bed mobility comments: no physical assist  needed from elevated HOB    Transfers Overall transfer level: Needs assistance Equipment used: Straight cane Transfers: Sit to/from Stand Sit to Stand: Min guard           General transfer comment: Min guard for safety from EOB and stand>sit to recliner. x2 reps from chair and x2 from EOB    Ambulation/Gait Ambulation/Gait assistance: Min guard Gait Distance (Feet): 50 Feet (37ft in room, seated break, then 51ft) Assistive device: Straight cane Gait Pattern/deviations: Step-through pattern, Decreased stride length       General Gait Details: pt reports 5/10 RPE after second gait trial; DOE 2/4 with household distance gait tasks, no overt LOB; min cues for line awareness and activity pacing needed   Stairs         General stair comments: pt defers    Balance Overall balance assessment: Needs assistance Sitting-balance support: Feet supported Sitting balance-Leahy Scale: Good     Standing balance support: Single extremity supported Standing balance-Leahy Scale: Poor Standing balance comment: Reliant on 1 UE and unable to tolerate a challenge                            Cognition Arousal/Alertness: Awake/alert Behavior During Therapy: Flat affect, WFL for tasks assessed/performed Overall Cognitive Status: Within Functional Limits for tasks assessed Area of Impairment: Safety/judgement                         Safety/Judgement: Decreased awareness of safety   Problem Solving: Requires verbal cues General Comments: Appears WFL this session, needs encouragement to progress activity level and decreased insight into risks of immobility, pt hesitant to get OOB to chair and particular about  not rearranging furniture in his room, although with chair in far corner it was too far away from call bell to use. Pt agreeable to keep chair slightly closer to bed once PTA instructed him on this. Very flat affect.        Exercises      General Comments  General comments (skin integrity, edema, etc.): SpO2 90-93% on 3L O2 [place], DOE 2/4      Pertinent Vitals/Pain Pain Assessment Pain Assessment: No/denies pain Pain Intervention(s): Monitored during session, Repositioned           PT Goals (current goals can now be found in the care plan section) Acute Rehab PT Goals Patient Stated Goal: to feel better and go home PT Goal Formulation: With patient Time For Goal Achievement: 09/07/21 Progress towards PT goals: Progressing toward goals    Frequency    Min 2X/week      PT Plan Current plan remains appropriate       AM-PAC PT "6 Clicks" Mobility   Outcome Measure  Help needed turning from your back to your side while in a flat bed without using bedrails?: None Help needed moving from lying on your back to sitting on the side of a flat bed without using bedrails?: None Help needed moving to and from a bed to a chair (including a wheelchair)?: A Little Help needed standing up from a chair using your arms (e.g., wheelchair or bedside chair)?: A Little Help needed to walk in hospital room?: A Little Help needed climbing 3-5 steps with a railing? : Total 6 Click Score: 18    End of Session Equipment Utilized During Treatment: Oxygen Activity Tolerance: Patient limited by fatigue Patient left: in chair;with call bell/phone within reach;with chair alarm set Nurse Communication: Mobility status;Other (comment) (Pt instructed on call bell use but may need to be checked on for safety due to high fall risk; chair alarm on) PT Visit Diagnosis: Unsteadiness on feet (R26.81);Muscle weakness (generalized) (M62.81);History of falling (Z91.81)     Time: 2355-7322 PT Time Calculation (min) (ACUTE ONLY): 21 min  Charges:  $Gait Training: 8-22 mins                     Jacqueleen Pulver P., PTA Acute Rehabilitation Services Secure Chat Preferred 9a-5:30pm Office: [place] 08/27/2021, 7:15 PM

## 2021-08-27 NOTE — TOC Progression Note (Signed)
Transition of Care [facility]) - Progression Note    Patient Details  Name: Mario [name] MRN: [id number] Date of Birth: December 05, 1950  [facility]) CM/SW [place], LCSW Phone Number: 08/27/2021, 6:52 PM  Clinical Narrative:    Continuing bed search.    Expected Discharge Plan: Skilled Nursing Facility Barriers to Discharge: Ship broker, SNF Pending bed offer, Awaiting State Approval Forensic scientist)  Expected Discharge Plan and Services Expected Discharge Plan: [place] In-house Referral: Clinical Social Work   Post Acute Care Choice: [place] Living arrangements for the past 2 months: Apartment                                       Social Determinants of Health (SDOH) Interventions    Readmission Risk Interventions    08/23/2021   11:48 AM 08/05/2021   11:02 AM 06/07/2021    4:15 PM  Readmission Risk Prevention Plan  Transportation Screening Complete Complete Complete  Medication Review Press photographer) Complete Complete Complete  PCP or Specialist appointment within 3-5 days of discharge Complete Complete Complete  HRI or Home Care Consult Complete Complete Complete  SW Recovery Care/Counseling Consult Complete Complete Complete  Palliative Care Screening Not Applicable Not Applicable Not [place] Not Applicable Not Applicable Not Applicable

## 2021-08-28 ENCOUNTER — Inpatient Hospital Stay (HOSPITAL_COMMUNITY): Payer: No Typology Code available for payment source

## 2021-08-28 DIAGNOSIS — J90 Pleural effusion, not elsewhere classified: Secondary | ICD-10-CM

## 2021-08-28 DIAGNOSIS — I10 Essential (primary) hypertension: Secondary | ICD-10-CM

## 2021-08-28 DIAGNOSIS — G4733 Obstructive sleep apnea (adult) (pediatric): Secondary | ICD-10-CM

## 2021-08-28 DIAGNOSIS — Z86711 Personal history of pulmonary embolism: Secondary | ICD-10-CM

## 2021-08-28 MED ORDER — OXYCODONE HCL 5 MG PO TABS
5.0000 mg | ORAL_TABLET | Freq: Once | ORAL | Status: AC
  Administered 2021-08-28: 5 mg via ORAL
  Filled 2021-08-28: qty 1

## 2021-08-28 NOTE — Progress Notes (Signed)
Pt refusing CPAP

## 2021-08-28 NOTE — Progress Notes (Signed)
Progress Note    [name]  MIW:[id number] DOB: Jul 10, 1950  DOA: 08/23/2021 PCP: [facility]      Brief Narrative:    Medical records reviewed and are as summarized below:  Mario [name] is a 71 y.o. 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  He went home and felt fine but after a few hours when he started ambulating he had left-sided pleuritic chest pain which is acute on chronic and shortness of breath, he came back to the ER and was admitted for further care, he now says that he cannot stay by himself and will be agreeable to going to SNF.  Pneumonia was suspected on CT chest.  Pulmonologist was consulted and he recommended treating for pneumonia.  He expressed suicidal thoughts which prompted initiation of suicide precautions and a psychiatry consult.  He has been cleared by the psychiatrist and suicide precautions have been discontinued.  Palliative care team was consulted to assist with pain management.  08/28/2021: Patient seen alongside patient's nurse.  No new changes.  Patient tells me that the left Pleurx catheter is no longer draining.  We will get a chest x-ray.  We will also consult interventional radiology team.  Assessment/Plan:   Principal Problem:   Pneumonia Active Problems:   Major depressive disorder, recurrent episode (HCC)   OSA (obstructive sleep apnea)   COPD (chronic obstructive pulmonary disease) (HCC)   Non-small cell cancer of left lung (HCC)   Hypertension   History of pulmonary embolism   Metastatic carcinoma (HCC)   Chronic respiratory failure with hypoxia  (HCC)   Pleural effusion, left   Rib fractures    Body mass index is 41.61 kg/m.  (Morbid obesity)   Ongoing pleuritic left-sided chest pain in a patient with history of left-sided rib fractures, Metastatic NSCLC; malignant pleural effusion following with Dr. Lona Millard with left-sided Pleurx catheter - Presented with LLL mass in June 2022 s/p SBRT, then developed malignant pleural effusion in May 2023 with PleurX placed on the left side by IR, following with Dr. Julien Nordmann of oncology who earlier this month gave pt option of hospice referral or palliative systemic chemotherapy with immune therapy. For now, he wants to continue medical treatment.  Continue Pleurx catheter drain care.     Was seen by IR on 08/22/2021 on last admission, ultrasound of the lung was done for minimal pleural effusion not needing any drainage at that time, as recommended earlier by oncology palliative care will be involved for goals of care. Follow-up with palliative care team.  Case discussed with Lawrence Santiago, NP, with palliative care team. 08/28/2021: No new changes.  Patient continues to report left-sided pain.  Pleurx cath status and not to be draining.  Get chest x-ray.  We will consult interventional radiology.  Pursue SNF placement.    2.  Probable pneumonia: CT scan suggested left lower lobe changes appear to be chronic.  Pulmonologist recommended treating for pneumonia for 5 to 7 days.  Change IV azithromycin to oral azithromycin to complete 5 days of treatment on 08/28/2021 08/28/2021: No constitutional symptoms endorsed today.   3.  COPD, chronic hypoxic respiratory failure : he is  on 3 L/min oxygen at home.  Continue bronchodilators as needed. 08/28/2021: Stable.   4.  Generalized weakness and deconditioning.  He has not treated to go to SNF.  Follow-up with social worker to assist with disposition.    5. OSA - CPAP qHS    6. Depression with suicidal thoughts, anxiety -he was evaluated by the psychiatrist.  No  indication for involuntary commitment.  Per psychiatrist, okay to discontinue one-to-one sitter and suicide precautions.  Continue psychotropics.   7. Chronic pain -continue analgesics as needed including lidocaine patch  8. Hx of PE - Continue Eliquis       Diet Order             Diet regular Room service appropriate? Yes; Fluid consistency: Thin  Diet effective now                  Consultants: Pulmonologist  Procedures: None    Medications:    acetaminophen  1,000 mg Oral TID   apixaban  5 mg Oral BID   arformoterol  15 mcg Nebulization BID   aspirin EC  81 mg Oral Daily   budesonide  0.5 mg Inhalation Q2000   buPROPion  300 mg Oral Daily   busPIRone  15 mg Oral Daily   cefdinir  300 mg Oral Q12H   cycloSPORINE  1 drop Both Eyes BID   diltiazem  180 mg Oral Daily   DULoxetine  60 mg Oral BID   gabapentin  300 mg Oral BID   lidocaine  1 patch Transdermal Q24H   lurasidone  40 mg Oral QPC supper   melatonin  6 mg Oral QHS   mycophenolate  1,500 mg Oral BID   oxcarbazepine  600 mg Oral Daily   oxcarbazepine  900 mg Oral QHS   oxyCODONE  20 mg Oral Q12H   polyethylene glycol  17 g Oral Daily   senna-docusate  2 tablet Oral BID   sodium chloride flush  3 mL Intravenous Q12H   tamsulosin  0.4 mg Oral Daily   traZODone  75 mg Oral QHS   Continuous Infusions:     Anti-infectives (From admission, onward)    Start     Dose/Rate Route Frequency Ordered Stop   08/27/21 1000  cefdinir (OMNICEF) capsule 300 mg        300 mg Oral Every 12 hours 08/26/21 1136 08/29/21 0959   08/26/21 0600  azithromycin (ZITHROMAX) tablet 500 mg        500 mg Oral Every 24 hours 08/25/21 0851 08/28/21 0535   08/24/21 0415  cefTRIAXone (ROCEPHIN) 2 g in sodium chloride 0.9 % 100 mL IVPB  Status:  Discontinued        2 g 200 mL/hr over 30 Minutes Intravenous Every 24 hours 08/24/21 0402 08/26/21 1135   08/24/21 0415  azithromycin (ZITHROMAX) 500 mg in sodium chloride 0.9 % 250 mL  IVPB  Status:  Discontinued        500 mg 250 mL/hr over 60 Minutes Intravenous Every 24 hours 08/24/21 0402 08/25/21 0850   08/23/21 2345  ceFEPIme (MAXIPIME) 2 g in sodium chloride 0.9 % 100 mL IVPB        2 g 200 mL/hr over 30 Minutes Intravenous  Once 08/23/21 2332 08/24/21 0048   08/23/21 2330  vancomycin (VANCOREADY) IVPB 2000 mg/400 mL        2,000 mg 200 mL/hr over 120 Minutes Intravenous  Once 08/23/21 2332 08/24/21 0302  Family Communication/Anticipated D/C date and plan/Code Status   DVT prophylaxis:  apixaban (ELIQUIS) tablet 5 mg     Code Status: DNR  Family Communication: None Disposition Plan: Plan to discharge to SNF   Status is: Inpatient Remains inpatient appropriate because: Awaiting placement to SNF  Subjective:   No new complaints.  Objective:    Vitals:   08/27/21 2100 08/28/21 0115 08/28/21 0801 08/28/21 0940  BP: (!) 141/83 (!) 142/74  135/79  Pulse: 90 85  82  Resp: 18 19  17   Temp: 97.9 F (36.6 C) 98 F (36.7 C)  97.8 F (36.6 C)  TempSrc: Oral Oral  Oral  SpO2: 98% 98% 93% 95%  Weight:      Height:       No data found.   Intake/Output Summary (Last 24 hours) at 08/28/2021 1016 Last data filed at 08/28/2021 0940 Gross per 24 hour  Intake --  Output 1670 ml  Net -1670 ml    Filed Weights   08/23/21 1859  Weight: (!) 147 kg    Exam:  GEN: Morbidly obese.  Not in any distress.  Awake and alert. HEENT: Patient is pale.  No jaundice. Neck: Supple. Lungs: Decreased air entry. CVS: S1-S2. Abdomen: Soft and nontender.  Abdomen is obese. Neuro: Awake and alert.  Moves all extremities. Extremities: Bilateral lower extremity edema.   Data Reviewed:   I have personally reviewed following labs and imaging studies:  Labs: Labs show the following:   Basic Metabolic Panel: Recent Labs  Lab 08/22/21 0048 08/23/21 0311 08/23/21 1901 08/23/21 1920 08/24/21 0416 08/25/21 0225 08/26/21 0141  NA 136 135 134* 134* 137  137 135  K 4.4 3.7 4.7 4.7 3.7 4.0 4.2  CL 101 101 101  --  101 100 103  CO2 28 26 23   --  26 26 25   GLUCOSE 159* 139* 144*  --  103* 110* 112*  BUN 14 10 14   --  10 13 12   CREATININE 1.01 0.89 0.89  --  0.88 0.95 0.76  CALCIUM 8.2* 8.4* 8.7*  --  9.0 8.9 8.7*  MG 2.2 2.0  --   --   --  2.1 2.0    GFR Estimated Creatinine Clearance: 133.2 mL/min (by C-G formula based on SCr of 0.76 mg/dL). Liver Function Tests: Recent Labs  Lab 08/22/21 0048 08/23/21 0311 08/23/21 1901  AST 24 18 21   ALT 17 15 18   ALKPHOS 62 61 72  BILITOT 0.2* 0.1* 0.3  PROT 5.4* 5.6* 6.4*  ALBUMIN 2.5* 2.6* 3.1*    No results for input(s): "LIPASE", "AMYLASE" in the last 168 hours. No results for input(s): "AMMONIA" in the last 168 hours. Coagulation profile No results for input(s): "INR", "PROTIME" in the last 168 hours.   CBC: Recent Labs  Lab 08/22/21 0048 08/23/21 0311 08/23/21 1901 08/23/21 1920 08/24/21 0416 08/25/21 0225 08/26/21 0141  WBC 11.4* 12.1* 13.4*  --  11.6* 9.2 10.1  NEUTROABS 8.8* 9.7*  --   --   --   --   --   HGB 9.6* 9.7* 11.2* 11.9* 11.1* 10.8* 10.8*  HCT 29.7* 31.1* 34.4* 35.0* 35.3* 34.0* 34.2*  MCV 90.5 90.7 90.1  --  90.5 89.9 89.1  PLT 445* 437* 487*  --  459* 449* 435*    Cardiac Enzymes: No results for input(s): "CKTOTAL", "CKMB", "CKMBINDEX", "TROPONINI" in the last 168 hours. BNP (last 3 results) No results for input(s): "PROBNP" in the last 8760 hours. CBG: No  results for input(s): "GLUCAP" in the last 168 hours. D-Dimer: No results for input(s): "DDIMER" in the last 72 hours. Hgb A1c: No results for input(s): "HGBA1C" in the last 72 hours. Lipid Profile: No results for input(s): "CHOL", "HDL", "LDLCALC", "TRIG", "CHOLHDL", "LDLDIRECT" in the last 72 hours. Thyroid function studies: No results for input(s): "TSH", "T4TOTAL", "T3FREE", "THYROIDAB" in the last 72 hours.  Invalid input(s): "FREET3" Anemia work up: No results for input(s):  "VITAMINB12", "FOLATE", "FERRITIN", "TIBC", "IRON", "RETICCTPCT" in the last 72 hours. Sepsis Labs: Recent Labs  Lab 08/22/21 0048 08/23/21 0311 08/23/21 1901 08/24/21 0416 08/25/21 0225 08/26/21 0141  PROCALCITON <0.10 <0.10  --  <0.10 <0.10  --   WBC 11.4* 12.1* 13.4* 11.6* 9.2 10.1  LATICACIDVEN  --   --   --  1.1  --   --      Microbiology Recent Results (from the past 240 hour(s))  Blood Culture (routine x 2)     Status: None   Collection Time: 08/20/21  6:19 PM   Specimen: BLOOD  Result Value Ref Range Status   Specimen Description BLOOD BLOOD RIGHT HAND  Final   Special Requests   Final    BOTTLES DRAWN AEROBIC ONLY Blood Culture results may not be optimal due to an inadequate volume of blood received in culture bottles   Culture   Final    NO GROWTH 5 DAYS Performed at [facility], [place] [address], [place], [place] [address]    Report Status 08/25/2021 FINAL  Final  Blood Culture (routine x 2)     Status: None   Collection Time: 08/20/21  6:59 PM   Specimen: BLOOD  Result Value Ref Range Status   Specimen Description BLOOD BLOOD RIGHT ARM  Final   Special Requests   Final    BOTTLES DRAWN AEROBIC AND ANAEROBIC Blood Culture adequate volume   Culture   Final    NO GROWTH 5 DAYS Performed at [facility], [place] [address] [place], [place] [address]    Report Status 08/25/2021 FINAL  Final  Urine Culture     Status: None   Collection Time: 08/21/21 12:37 AM   Specimen: In/Out Cath Urine  Result Value Ref Range Status   Specimen Description IN/OUT CATH URINE  Final   Special Requests NONE  Final   Culture   Final    NO GROWTH Performed at [facility], [place] [address] [place], [place] [address]    Report Status 08/22/2021 FINAL  Final  Blood culture (routine x 2)     Status: None (Preliminary result)   Collection Time: 08/23/21 11:50 PM   Specimen: BLOOD RIGHT FOREARM  Result Value Ref Range Status   Specimen Description BLOOD RIGHT  FOREARM  Final   Special Requests   Final    BOTTLES DRAWN AEROBIC ONLY Blood Culture results may not be optimal due to an inadequate volume of blood received in culture bottles   Culture   Final    NO GROWTH 4 DAYS Performed at [facility], [place] [address] [place], [place] [address]    Report Status PENDING  Incomplete  Blood culture (routine x 2)     Status: None (Preliminary result)   Collection Time: 08/23/21 11:59 PM   Specimen: BLOOD  Result Value Ref Range Status   Specimen Description BLOOD LEFT ANTECUBITAL  Final   Special Requests   Final    BOTTLES DRAWN AEROBIC AND ANAEROBIC Blood Culture  adequate volume   Culture   Final    NO GROWTH 4 DAYS Performed at [facility], [place] [address], [place], [place] [address]    Report Status PENDING  Incomplete    Procedures and diagnostic studies:  DG Lumbar Spine 2-3 Views  Result Date: 08/26/2021 CLINICAL DATA:  Injury. EXAM: LUMBAR SPINE - 2-3 VIEW COMPARISON:  Lumbar spine x-ray 07/22/2020 FINDINGS: There is no evidence of lumbar spine fracture. Alignment is normal. Degenerative disc space changes appear similar to the prior examination with endplate osteophytes and sclerosis. This is most significant at L4-L5, unchanged. IMPRESSION: 1. No acute fracture or malalignment. 2. Stable degenerative changes. Electronically Signed   By: Ronney Asters M.D.   On: 08/26/2021 18:08      LOS: 5 days   [facility]   Pager on [email or web address]. If 7PM-7AM, please contact night-coverage at [email or web address]     08/28/2021, 10:16 AM

## 2021-08-29 LAB — CULTURE, BLOOD (ROUTINE X 2)
Culture: NO GROWTH
Culture: NO GROWTH
Special Requests: ADEQUATE

## 2021-08-29 NOTE — Progress Notes (Signed)
Request received for left PleurX eval, as it is not draining.   Imaging reviewed with Dr. Laurence Ferrari, the PleurX is not communicating with pleural effusion and it is in a position that it can be repositioned.    Recommends PleurX removal, IR can be re-consulted for PleurX catheter placement if patient develops recurrent pleural effusions again.   Attending MD notified via secure chat.   IR will remove the PleurX catheter tomorrow with local anesthetics only (no sedation required.)  Please call IR for questions and concerns.   Armando Gang Kalina Morabito PA-C 08/29/2021 9:43 AM

## 2021-08-29 NOTE — Progress Notes (Signed)
Pt refused CPAP for the night. RT will cont to monitor as needed.

## 2021-08-29 NOTE — Progress Notes (Signed)
Progress Note    Mario [name]  XTK:[id number] DOB: November 26, 1950  DOA: 08/23/2021 PCP: [facility]      Brief Narrative:    Medical records reviewed and are as summarized below:  Mario [name] is a 71 y.o. 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  He went home and felt fine but after a few hours when he started ambulating he had left-sided pleuritic chest pain which is acute on chronic and shortness of breath, he came back to the ER and was admitted for further care, he now says that he cannot stay by himself and will be agreeable to going to SNF.  Pneumonia was suspected on CT chest.  Pulmonologist was consulted and he recommended treating for pneumonia.  He expressed suicidal thoughts which prompted initiation of suicide precautions and a psychiatry consult.  He has been cleared by the psychiatrist and suicide precautions have been discontinued.  Palliative care team was consulted to assist with pain management.  08/28/2021: Patient seen alongside patient's nurse.  No new changes.  Patient tells me that the left Pleurx catheter is no longer draining.  We will get a chest x-ray.  We will also consult interventional radiology team.  08/29/2021: Patient remains stable for discharge.  Pursue placement.  For Pleurx catheter removal tomorrow.   .  Assessment/Plan:   Principal Problem:   Pneumonia Active Problems:   Major depressive disorder, recurrent episode (HCC)   OSA (obstructive sleep apnea)   COPD (chronic obstructive pulmonary disease) (HCC)   Non-small cell cancer of left lung (HCC)    Hypertension   History of pulmonary embolism   Metastatic carcinoma (HCC)   Chronic respiratory failure with hypoxia (HCC)   Pleural effusion, left   Rib fractures    Body mass index is 41.61 kg/m.  (Morbid obesity)   Ongoing pleuritic left-sided chest pain in a patient with history of left-sided rib fractures, Metastatic NSCLC; malignant pleural effusion following with Dr. Lona Millard with left-sided Pleurx catheter - Presented with LLL mass in June 2022 s/p SBRT, then developed malignant pleural effusion in May 2023 with PleurX placed on the left side by IR, following with Dr. Julien Nordmann of oncology who earlier this month gave pt option of hospice referral or palliative systemic chemotherapy with immune therapy. For now, he wants to continue medical treatment.  Continue Pleurx catheter drain care.     Was seen by IR on 08/22/2021 on last admission, ultrasound of the lung was done for minimal pleural effusion not needing any drainage at that time, as recommended earlier by oncology palliative care will be involved for goals of care. Follow-up with palliative care team.  Case discussed with Lawrence Santiago, NP, with palliative care team. 08/28/2021: No new changes.  Patient continues to report left-sided pain.  Pleurx cath status and not to be draining.  Get chest x-ray.  We will consult interventional radiology.  Pursue SNF placement. 08/29/2021: Pain is controlled.  Interventional radiology input is appreciated.  For Pleurx catheter removal tomorrow.  Pursue disposition.   2.  Probable pneumonia: CT scan suggested left lower lobe changes appear to be chronic.  Pulmonologist recommended treating for pneumonia for  5 to 7 days.  Change IV azithromycin to oral azithromycin to complete 5 days of treatment on 08/28/2021 08/28/2021: No constitutional symptoms endorsed today.   3.  COPD, chronic hypoxic respiratory failure : he is on 3 L/min oxygen at home.  Continue bronchodilators as needed. 08/29/2021: Stable.   4.   Generalized weakness and deconditioning.  He has not treated to go to SNF.  Follow-up with social worker to assist with disposition.    5. OSA - CPAP qHS    6. Depression with suicidal thoughts, anxiety -he was evaluated by the psychiatrist.  No indication for involuntary commitment.  Per psychiatrist, okay to discontinue one-to-one sitter and suicide precautions.  Continue psychotropics.   7. Chronic pain -continue analgesics as needed including lidocaine patch  8. Hx of PE - Continue Eliquis       Diet Order             Diet regular Room service appropriate? Yes; Fluid consistency: Thin  Diet effective now                  Consultants: Pulmonologist  Procedures: None    Medications:    acetaminophen  1,000 mg Oral TID   apixaban  5 mg Oral BID   arformoterol  15 mcg Nebulization BID   aspirin EC  81 mg Oral Daily   budesonide  0.5 mg Inhalation Q2000   buPROPion  300 mg Oral Daily   busPIRone  15 mg Oral Daily   cycloSPORINE  1 drop Both Eyes BID   diltiazem  180 mg Oral Daily   DULoxetine  60 mg Oral BID   gabapentin  300 mg Oral BID   lidocaine  1 patch Transdermal Q24H   lurasidone  40 mg Oral QPC supper   melatonin  6 mg Oral QHS   mycophenolate  1,500 mg Oral BID   oxcarbazepine  600 mg Oral Daily   oxcarbazepine  900 mg Oral QHS   oxyCODONE  20 mg Oral Q12H   polyethylene glycol  17 g Oral Daily   senna-docusate  2 tablet Oral BID   sodium chloride flush  3 mL Intravenous Q12H   tamsulosin  0.4 mg Oral Daily   traZODone  75 mg Oral QHS   Continuous Infusions:     Anti-infectives (From admission, onward)    Start     Dose/Rate Route Frequency Ordered Stop   08/27/21 1000  cefdinir (OMNICEF) capsule 300 mg        300 mg Oral Every 12 hours 08/26/21 1136 08/28/21 2152   08/26/21 0600  azithromycin (ZITHROMAX) tablet 500 mg        500 mg Oral Every 24 hours 08/25/21 0851 08/28/21 0535   08/24/21 0415  cefTRIAXone (ROCEPHIN) 2 g in sodium  chloride 0.9 % 100 mL IVPB  Status:  Discontinued        2 g 200 mL/hr over 30 Minutes Intravenous Every 24 hours 08/24/21 0402 08/26/21 1135   08/24/21 0415  azithromycin (ZITHROMAX) 500 mg in sodium chloride 0.9 % 250 mL IVPB  Status:  Discontinued        500 mg 250 mL/hr over 60 Minutes Intravenous Every 24 hours 08/24/21 0402 08/25/21 0850   08/23/21 2345  ceFEPIme (MAXIPIME) 2 g in sodium chloride 0.9 % 100 mL IVPB        2 g 200 mL/hr over 30 Minutes Intravenous  Once 08/23/21 2332 08/24/21 0048   08/23/21 2330  vancomycin (  VANCOREADY) IVPB 2000 mg/400 mL        2,000 mg 200 mL/hr over 120 Minutes Intravenous  Once 08/23/21 2332 08/24/21 0302       Family Communication/Anticipated D/C date and plan/Code Status   DVT prophylaxis:  apixaban (ELIQUIS) tablet 5 mg     Code Status: DNR  Family Communication: None Disposition Plan: Plan to discharge to SNF   Status is: Inpatient Remains inpatient appropriate because: Awaiting placement to SNF  Subjective:   No new complaints.  Objective:    Vitals:   08/28/21 1918 08/28/21 2021 08/29/21 0837 08/29/21 1649  BP:  (!) 111/52 105/66 137/75  Pulse:  93 89 89  Resp:  18 17 16   Temp:  98.3 F (36.8 C) 97.8 F (36.6 C) 97.9 F (36.6 C)  TempSrc:  Oral Oral Oral  SpO2: 94% 98% 95% 98%  Weight:      Height:       No data found.   Intake/Output Summary (Last 24 hours) at 08/29/2021 1837 Last data filed at 08/28/2021 2155 Gross per 24 hour  Intake 3 ml  Output --  Net 3 ml    Filed Weights   08/23/21 1859  Weight: (!) 147 kg    Exam:  GEN: Morbidly obese.  Not in any distress.  Awake and alert. HEENT: Patient is pale.  No jaundice. Neck: Supple. Lungs: Decreased air entry. CVS: S1-S2. Abdomen: Soft and nontender.  Abdomen is obese. Neuro: Awake and alert.  Moves all extremities. Extremities: Bilateral lower extremity edema.   Data Reviewed:   I have personally reviewed following labs and imaging  studies:  Labs: Labs show the following:   Basic Metabolic Panel: Recent Labs  Lab 08/23/21 0311 08/23/21 1901 08/23/21 1920 08/24/21 0416 08/25/21 0225 08/26/21 0141  NA 135 134* 134* 137 137 135  K 3.7 4.7 4.7 3.7 4.0 4.2  CL 101 101  --  101 100 103  CO2 26 23  --  26 26 25   GLUCOSE 139* 144*  --  103* 110* 112*  BUN 10 14  --  10 13 12   CREATININE 0.89 0.89  --  0.88 0.95 0.76  CALCIUM 8.4* 8.7*  --  9.0 8.9 8.7*  MG 2.0  --   --   --  2.1 2.0    GFR Estimated Creatinine Clearance: 133.2 mL/min (by C-G formula based on SCr of 0.76 mg/dL). Liver Function Tests: Recent Labs  Lab 08/23/21 0311 08/23/21 1901  AST 18 21  ALT 15 18  ALKPHOS 61 72  BILITOT 0.1* 0.3  PROT 5.6* 6.4*  ALBUMIN 2.6* 3.1*    No results for input(s): "LIPASE", "AMYLASE" in the last 168 hours. No results for input(s): "AMMONIA" in the last 168 hours. Coagulation profile No results for input(s): "INR", "PROTIME" in the last 168 hours.   CBC: Recent Labs  Lab 08/23/21 0311 08/23/21 1901 08/23/21 1920 08/24/21 0416 08/25/21 0225 08/26/21 0141  WBC 12.1* 13.4*  --  11.6* 9.2 10.1  NEUTROABS 9.7*  --   --   --   --   --   HGB 9.7* 11.2* 11.9* 11.1* 10.8* 10.8*  HCT 31.1* 34.4* 35.0* 35.3* 34.0* 34.2*  MCV 90.7 90.1  --  90.5 89.9 89.1  PLT 437* 487*  --  459* 449* 435*    Cardiac Enzymes: No results for input(s): "CKTOTAL", "CKMB", "CKMBINDEX", "TROPONINI" in the last 168 hours. BNP (last 3 results) No results for input(s): "PROBNP" in the last  8760 hours. CBG: No results for input(s): "GLUCAP" in the last 168 hours. D-Dimer: No results for input(s): "DDIMER" in the last 72 hours. Hgb A1c: No results for input(s): "HGBA1C" in the last 72 hours. Lipid Profile: No results for input(s): "CHOL", "HDL", "LDLCALC", "TRIG", "CHOLHDL", "LDLDIRECT" in the last 72 hours. Thyroid function studies: No results for input(s): "TSH", "T4TOTAL", "T3FREE", "THYROIDAB" in the last 72  hours.  Invalid input(s): "FREET3" Anemia work up: No results for input(s): "VITAMINB12", "FOLATE", "FERRITIN", "TIBC", "IRON", "RETICCTPCT" in the last 72 hours. Sepsis Labs: Recent Labs  Lab 08/23/21 0311 08/23/21 1901 08/24/21 0416 08/25/21 0225 08/26/21 0141  PROCALCITON <0.10  --  <0.10 <0.10  --   WBC 12.1* 13.4* 11.6* 9.2 10.1  LATICACIDVEN  --   --  1.1  --   --      Microbiology Recent Results (from the past 240 hour(s))  Blood Culture (routine x 2)     Status: None   Collection Time: 08/20/21  6:19 PM   Specimen: BLOOD  Result Value Ref Range Status   Specimen Description BLOOD BLOOD RIGHT HAND  Final   Special Requests   Final    BOTTLES DRAWN AEROBIC ONLY Blood Culture results may not be optimal due to an inadequate volume of blood received in culture bottles   Culture   Final    NO GROWTH 5 DAYS Performed at [facility], [place] [address], [place], [place] [address]    Report Status 08/25/2021 FINAL  Final  Blood Culture (routine x 2)     Status: None   Collection Time: 08/20/21  6:59 PM   Specimen: BLOOD  Result Value Ref Range Status   Specimen Description BLOOD BLOOD RIGHT ARM  Final   Special Requests   Final    BOTTLES DRAWN AEROBIC AND ANAEROBIC Blood Culture adequate volume   Culture   Final    NO GROWTH 5 DAYS Performed at [facility], [place] [address], [place], [place] [address]    Report Status 08/25/2021 FINAL  Final  Urine Culture     Status: None   Collection Time: 08/21/21 12:37 AM   Specimen: In/Out Cath Urine  Result Value Ref Range Status   Specimen Description IN/OUT CATH URINE  Final   Special Requests NONE  Final   Culture   Final    NO GROWTH Performed at [facility], [place] [address], [place], [place] [address]    Report Status 08/22/2021 FINAL  Final  Blood culture (routine x 2)     Status: None   Collection Time: 08/23/21 11:50 PM   Specimen: BLOOD RIGHT FOREARM  Result Value Ref Range Status   Specimen  Description BLOOD RIGHT FOREARM  Final   Special Requests   Final    BOTTLES DRAWN AEROBIC ONLY Blood Culture results may not be optimal due to an inadequate volume of blood received in culture bottles   Culture   Final    NO GROWTH 5 DAYS Performed at [facility], [place] [address], [place], [place] [address]    Report Status 08/29/2021 FINAL  Final  Blood culture (routine x 2)     Status: None   Collection Time: 08/23/21 11:59 PM   Specimen: BLOOD  Result Value Ref Range Status   Specimen Description BLOOD LEFT ANTECUBITAL  Final   Special Requests   Final    BOTTLES DRAWN AEROBIC AND ANAEROBIC Blood Culture adequate volume   Culture  Final    NO GROWTH 5 DAYS Performed at [facility], [place] [address], [place], [place] [address]    Report Status 08/29/2021 FINAL  Final    Procedures and diagnostic studies:  DG Chest 2 View  Result Date: 08/28/2021 CLINICAL DATA:  Followup pleural effusion. EXAM: CHEST - 2 VIEW COMPARISON:  08/23/2021. FINDINGS: Left lung base opacity consistent with the small effusion and associated parenchymal opacity noted on the previous CT scan. Within this there is a rounded area of air density with a dependent fluid level consistent with the localized cyst/cavity noted on the prior CT scan. A chest tube extends from the lateral lower hemithorax along the left heart border, tip along the anterior medial left pleural margin, stable from the prior CT. Right lung is hyperexpanded, but clear. Both lungs show thickened interstitial markings, chronic. No pneumothorax. IMPRESSION: 1. Findings are without significant change compared to the recent prior chest CT. Stable left chest tube. 2. Small left effusion with associated lung base opacity consistent with atelectasis, infection or a combination. 3. Stable air and fluid containing cavity at the left lung base, suspected to be an infected bullae. Bilateral lung base bullae noted on older CT scans of the chest. 4.  Underlying emphysema and chronic interstitial thickening. Electronically Signed   By: Lajean Manes M.D.   On: 08/28/2021 14:44      LOS: 6 days   [facility]   Pager on [email or web address]. If 7PM-7AM, please contact night-coverage at [email or web address]     08/29/2021, 6:37 PM

## 2021-08-30 MED ORDER — LIDOCAINE 5 % EX PTCH
2.0000 | MEDICATED_PATCH | CUTANEOUS | Status: DC
  Administered 2021-08-30 – 2021-09-06 (×8): 2 via TRANSDERMAL
  Filled 2021-08-30 (×8): qty 2

## 2021-08-30 NOTE — TOC Progression Note (Addendum)
Transition of Care [facility]) - Progression Note    Patient Details  Name: Mario [name] MRN: [id number] Date of Birth: 01-May-1950  Transition of [facility]) CM/SW [place], LCSW Phone Number: 08/30/2021, 12:37 PM  Clinical Narrative:    CSW spoke with Maudie Mercury with [facility]. They are unable to accept patient due to psych history. No other bed offers at this time.    Expected Discharge Plan: Skilled Nursing Facility Barriers to Discharge: Ship broker, SNF Pending bed offer, Awaiting State Approval Forensic scientist)  Expected Discharge Plan and Services Expected Discharge Plan: [place] In-house Referral: Clinical Social Work   Post Acute Care Choice: [facility] Living arrangements for the past 2 months: Apartment                                       Social Determinants of Health (SDOH) Interventions    Readmission Risk Interventions    08/23/2021   11:48 AM 08/05/2021   11:02 AM 06/07/2021    4:15 PM  Readmission Risk Prevention Plan  Transportation Screening Complete Complete Complete  Medication Review Press photographer) Complete Complete Complete  PCP or Specialist appointment within 3-5 days of discharge Complete Complete Complete  HRI or Home Care Consult Complete Complete Complete  SW Recovery Care/Counseling Consult Complete Complete Complete  Palliative Care Screening Not Applicable Not Applicable Not [place] Not Applicable Not Applicable Not Applicable

## 2021-08-30 NOTE — Progress Notes (Signed)
Physical Therapy Treatment Patient Details Name: Mario [name] MRN: [id number] DOB: 01/24/1951 Today's Date: 08/30/2021   History of Present Illness 71 y/o male admitted 6/26 after being discharged the same day, secondary to worsening L chest pain and continued PNA. PMH includes COPD, pleural effusion s/p pleurx catheter, lung cancer, HTN, and PE.    PT Comments    Pt received in supine, agreeable to therapy session with good participation and tolerance for gait and stair training. Pt able to perform tasks with up to light minA but needs cues for pursed-lip breathing and activity pacing due to dyspnea and desat to 89% on 3L O2 [name]. Pt would benefit from sitting upright more and use of IS for endurance building/pulmonary clearance, especially given L rib cage/low back pain. Pt continues to benefit from PT services to progress toward functional mobility goals.   Recommendations for follow up therapy are one component of a multi-disciplinary discharge planning process, led by the attending physician.  Recommendations may be updated based on patient status, additional functional criteria and insurance authorization.  Follow Up Recommendations  Skilled nursing-short term rehab (<3 hours/day)     Assistance Recommended at Discharge Frequent or constant Supervision/Assistance  Patient can return home with the following A little help with walking and/or transfers;A little help with bathing/dressing/bathroom;Assistance with cooking/housework;Help with stairs or ramp for entrance;Assist for transportation   Equipment Recommendations  None recommended by PT    Recommendations for Other Services       Precautions / Restrictions Precautions Precautions: Fall Precaution Comments: monitor O2, pt reports recent L rib fxs and pleurx catheter Restrictions Weight Bearing Restrictions: No     Mobility  Bed Mobility Overal bed mobility: Modified Independent Bed Mobility: Supine to Sit     Supine  to sit: HOB elevated, Modified independent (Device/Increase time)     General bed mobility comments: no physical assist needed from elevated HOB    Transfers Overall transfer level: Needs assistance Equipment used: Straight cane, Rolling walker (2 wheels) Transfers: Sit to/from Stand Sit to Stand: Min guard           General transfer comment: Min guard for safety to/from EOB; to RW initially, then with cane when returning from stairs    Ambulation/Gait Ambulation/Gait assistance: Min guard Gait Distance (Feet): 65 Feet Assistive device: Straight cane Gait Pattern/deviations: Step-through pattern, Decreased stride length Gait velocity: Decreased     General Gait Details: pt reports 5/10 RPE after gait and stair trial; DOE 2/4 with household distance gait tasks, no overt LOB; min cues for line awareness and activity pacing needed   Stairs Stairs: Yes Stairs assistance: Min assist Stair Management: One rail Right, Step to pattern, Forwards, With cane Number of Stairs: 3 General stair comments: cues for step sequencing and pursed-lip breathing, pt desat to 89% on 3L with exertion but improved with standing break and PLB to >90%. mild instability needing up to light minA stepping up.    Balance Overall balance assessment: Needs assistance Sitting-balance support: Feet supported Sitting balance-Leahy Scale: Good     Standing balance support: Single extremity supported Standing balance-Leahy Scale: Poor Standing balance comment: Reliant on 1 UE and unable to tolerate a challenge                            Cognition Arousal/Alertness: Awake/alert Behavior During Therapy: Flat affect, WFL for tasks assessed/performed Overall Cognitive Status: Within Functional Limits for tasks assessed Area of Impairment: Safety/judgement  Safety/Judgement: Decreased awareness of safety   Problem Solving: Requires verbal cues General  Comments: Appears WFL this session, less flat.        Exercises      General Comments General comments (skin integrity, edema, etc.): SpO2 desat to 89% on 3L but improves with standing break/pursed lip breathing. May require 4L for longer household distances, will continue to assess.      Pertinent Vitals/Pain Pain Assessment Pain Assessment: 0-10 Pain Score: 7  Pain Location: lower back/tailbone Pain Descriptors / Indicators: Guarding, Grimacing Pain Intervention(s): Monitored during session, Premedicated before session, Repositioned, Patient requesting pain meds-RN notified (pt asking for lidocaine patch)     PT Goals (current goals can now be found in the care plan section) Acute Rehab PT Goals Patient Stated Goal: to feel better and go home PT Goal Formulation: With patient Time For Goal Achievement: 09/07/21 Progress towards PT goals: Progressing toward goals    Frequency    Min 2X/week      PT Plan Current plan remains appropriate       AM-PAC PT "6 Clicks" Mobility   Outcome Measure  Help needed turning from your back to your side while in a flat bed without using bedrails?: A Little (needs rails) Help needed moving from lying on your back to sitting on the side of a flat bed without using bedrails?: A Little (reliant on rails) Help needed moving to and from a bed to a chair (including a wheelchair)?: A Little Help needed standing up from a chair using your arms (e.g., wheelchair or bedside chair)?: A Little Help needed to walk in hospital room?: A Little Help needed climbing 3-5 steps with a railing? : A Lot (mod cues) 6 Click Score: 17    End of Session Equipment Utilized During Treatment: Oxygen;Gait belt Activity Tolerance: Patient limited by pain Patient left: with call bell/phone within reach;in bed;Other (comment) (pt sitting EOB) Nurse Communication: Mobility status;Other (comment);Patient requests pain meds (Pt instructed on call bell use but may  need to be checked on for safety due to high fall risk; pt refusing bed alarm, wants a lidocaine patch for his lower back) PT Visit Diagnosis: Unsteadiness on feet (R26.81);Muscle weakness (generalized) (M62.81);History of falling (Z91.81)     Time: 8676-7209 PT Time Calculation (min) (ACUTE ONLY): 22 min  Charges:  $Gait Training: 8-22 mins                     Clora Ohmer P., PTA Acute Rehabilitation Services Secure Chat Preferred 9a-5:30pm Office: [place] 08/30/2021, 12:29 PM

## 2021-08-30 NOTE — Progress Notes (Signed)
Patient refusing bed alarm to be turned on

## 2021-08-30 NOTE — Progress Notes (Signed)
Patient refused CPAP use 

## 2021-08-30 NOTE — Progress Notes (Signed)
Progress Note    Mario [name]  PJA:[id number] DOB: [birth year]  DOA: 08/23/2021 PCP: [facility]    Brief Narrative:  Patient is a 71 year old male with medical history significant for PD, chronic hypoxic respiratory failure, malignant non-small cell lung cancer, vasculitis on mycophenolate, history of PE on Eliquis, hypertension, chronic pain, malignant pleural effusion with Pleurx catheter, and OSA on CPAP, who was admitted for chest pain and lightheadedness dehydration and AKI.  Patient has had recurrent falls at home.  Patient lives by himself.  Patient had refused SNF placement on last admission.  Patient was readmitted a day after discharge.  Apparently, patient went home and felt fine but after a few hours when he started ambulating he had left-sided pleuritic chest pain which is acute on chronic and shortness of breath, he came back to the ER and was admitted for further care.  Pleuritic chest pain is chronic.  Patient seems agreeable for SNF placement at this time.  Patient is awaiting SNF placement.  On presentation, pneumonia was suspected on CT chest.  Pulmonologist was consulted and he recommended treating for pneumonia.  He expressed suicidal thoughts which prompted initiation of suicide precautions and a psychiatry consult.  He has been cleared by the psychiatrist and suicide precautions have been discontinued.  Palliative care team was consulted to assist with pain management.  08/30/2021: Left Pleurx catheter is no longer draining.  Discussed with interventional radiology team.  Pleurx catheter will be removed today.  Patient is awaiting placement at a skilled nursing facility.  Patient will be discharged when a bed is available.    Assessment/Plan:   Principal Problem:   Pneumonia Active Problems:   Major depressive disorder, recurrent episode (HCC)   OSA (obstructive sleep apnea)   COPD (chronic obstructive pulmonary disease) (HCC)   Non-small cell cancer of  left lung (HCC)   Hypertension   History of pulmonary embolism   Metastatic carcinoma (HCC)   Chronic respiratory failure with hypoxia (HCC)   Pleural effusion, left   Rib fractures    Body mass index is 41.61 kg/m.  (Morbid obesity)   Ongoing pleuritic left-sided chest pain in a patient with history of left-sided rib fractures, Metastatic NSCLC; malignant pleural effusion following with Dr. Lona Millard with left-sided Pleurx catheter - Presented with LLL mass in June 2022 s/p SBRT, then developed malignant pleural effusion in May 2023 with PleurX placed on the left side by IR, following with Dr. Julien Nordmann of oncology who earlier this month gave pt option of hospice referral or palliative systemic chemotherapy with immune therapy. For now, he wants to continue medical treatment.  Pleurx catheter is no longer draining, may have lost communication with the pleural fluid, and will be removed today, Monday, 08/30/2021 by IR team..     Pursue disposition.   2.  Probable pneumonia: -CT scan suggested left lower lobe changes appear to be chronic. -Pulmonologist recommended treating for pneumonia for 5 to 7 days. -Patient completed 5-day treatment on 08/28/2021. -No constitutional symptoms endorsed today.   3.  COPD, chronic hypoxic respiratory failure:  -Patient is on 3 L/min oxygen at home. -Continue bronchodilators as needed. -Stable.   4.  Generalized weakness and deconditioning: -Pursue discharge to skilled nursing facility.   5. OSA - CPAP qHS    6. Depression with suicidal thoughts, anxiety: -Patient was evaluated by the psychiatrist.  No indication for involuntary commitment.  Per psychiatrist, okay to discontinue one-to-one sitter and suicide precautions.  Continue psychotropics.  7. Chronic pain -continue analgesics as needed including lidocaine patch  8. Hx of PE - Continue Eliquis       Diet Order             Diet regular Room service appropriate? Yes; Fluid consistency: Thin   Diet effective now                  Consultants: Pulmonologist  Procedures: None    Medications:    acetaminophen  1,000 mg Oral TID   apixaban  5 mg Oral BID   arformoterol  15 mcg Nebulization BID   aspirin EC  81 mg Oral Daily   budesonide  0.5 mg Inhalation Q2000   buPROPion  300 mg Oral Daily   busPIRone  15 mg Oral Daily   cycloSPORINE  1 drop Both Eyes BID   diltiazem  180 mg Oral Daily   DULoxetine  60 mg Oral BID   gabapentin  300 mg Oral BID   lidocaine  2 patch Transdermal Q24H   lurasidone  40 mg Oral QPC supper   melatonin  6 mg Oral QHS   mycophenolate  1,500 mg Oral BID   oxcarbazepine  600 mg Oral Daily   oxcarbazepine  900 mg Oral QHS   oxyCODONE  20 mg Oral Q12H   polyethylene glycol  17 g Oral Daily   senna-docusate  2 tablet Oral BID   sodium chloride flush  3 mL Intravenous Q12H   tamsulosin  0.4 mg Oral Daily   traZODone  75 mg Oral QHS   Continuous Infusions:     Anti-infectives (From admission, onward)    Start     Dose/Rate Route Frequency Ordered Stop   08/27/21 1000  cefdinir (OMNICEF) capsule 300 mg        300 mg Oral Every 12 hours 08/26/21 1136 08/28/21 2152   08/26/21 0600  azithromycin (ZITHROMAX) tablet 500 mg        500 mg Oral Every 24 hours 08/25/21 0851 08/28/21 0535   08/24/21 0415  cefTRIAXone (ROCEPHIN) 2 g in sodium chloride 0.9 % 100 mL IVPB  Status:  Discontinued        2 g 200 mL/hr over 30 Minutes Intravenous Every 24 hours 08/24/21 0402 08/26/21 1135   08/24/21 0415  azithromycin (ZITHROMAX) 500 mg in sodium chloride 0.9 % 250 mL IVPB  Status:  Discontinued        500 mg 250 mL/hr over 60 Minutes Intravenous Every 24 hours 08/24/21 0402 08/25/21 0850   08/23/21 2345  ceFEPIme (MAXIPIME) 2 g in sodium chloride 0.9 % 100 mL IVPB        2 g 200 mL/hr over 30 Minutes Intravenous  Once 08/23/21 2332 08/24/21 0048   08/23/21 2330  vancomycin (VANCOREADY) IVPB 2000 mg/400 mL        2,000 mg 200 mL/hr over 120  Minutes Intravenous  Once 08/23/21 2332 08/24/21 0302       Family Communication/Anticipated D/C date and plan/Code Status   DVT prophylaxis:  apixaban (ELIQUIS) tablet 5 mg     Code Status: DNR  Family Communication: None Disposition Plan: Plan to discharge to SNF   Status is: Inpatient Remains inpatient appropriate because: Awaiting placement to SNF  Subjective:   No new complaints.  Objective:    Vitals:   08/30/21 0756 08/30/21 0835 08/30/21 1100 08/30/21 1422  BP: (!) 122/57     Pulse: 81     Resp: 17  Temp: 98.5 F (36.9 C)     TempSrc: Oral     SpO2: 99% 93% (!) 89% 92%  Weight:      Height:       No data found.   Intake/Output Summary (Last 24 hours) at 08/30/2021 1756 Last data filed at 08/29/2021 2114 Gross per 24 hour  Intake 240 ml  Output --  Net 240 ml    Filed Weights   08/23/21 1859  Weight: (!) 147 kg    Exam:  GEN: Morbidly obese.  Not in any distress.  Awake and alert. HEENT: Patient is pale.  No jaundice. Neck: Supple. Lungs: Decreased air entry. CVS: S1-S2. Abdomen: Soft and nontender.  Abdomen is obese. Neuro: Awake and alert.  Moves all extremities. Extremities: Bilateral lower extremity edema.   Data Reviewed:   I have personally reviewed following labs and imaging studies:  Labs: Labs show the following:   Basic Metabolic Panel: Recent Labs  Lab 08/23/21 1901 08/23/21 1920 08/24/21 0416 08/25/21 0225 08/26/21 0141  NA 134* 134* 137 137 135  K 4.7 4.7 3.7 4.0 4.2  CL 101  --  101 100 103  CO2 23  --  26 26 25   GLUCOSE 144*  --  103* 110* 112*  BUN 14  --  10 13 12   CREATININE 0.89  --  0.88 0.95 0.76  CALCIUM 8.7*  --  9.0 8.9 8.7*  MG  --   --   --  2.1 2.0    GFR Estimated Creatinine Clearance: 133.2 mL/min (by C-G formula based on SCr of 0.76 mg/dL). Liver Function Tests: Recent Labs  Lab 08/23/21 1901  AST 21  ALT 18  ALKPHOS 72  BILITOT 0.3  PROT 6.4*  ALBUMIN 3.1*    No results for  input(s): "LIPASE", "AMYLASE" in the last 168 hours. No results for input(s): "AMMONIA" in the last 168 hours. Coagulation profile No results for input(s): "INR", "PROTIME" in the last 168 hours.   CBC: Recent Labs  Lab 08/23/21 1901 08/23/21 1920 08/24/21 0416 08/25/21 0225 08/26/21 0141  WBC 13.4*  --  11.6* 9.2 10.1  HGB 11.2* 11.9* 11.1* 10.8* 10.8*  HCT 34.4* 35.0* 35.3* 34.0* 34.2*  MCV 90.1  --  90.5 89.9 89.1  PLT 487*  --  459* 449* 435*    Cardiac Enzymes: No results for input(s): "CKTOTAL", "CKMB", "CKMBINDEX", "TROPONINI" in the last 168 hours. BNP (last 3 results) No results for input(s): "PROBNP" in the last 8760 hours. CBG: No results for input(s): "GLUCAP" in the last 168 hours. D-Dimer: No results for input(s): "DDIMER" in the last 72 hours. Hgb A1c: No results for input(s): "HGBA1C" in the last 72 hours. Lipid Profile: No results for input(s): "CHOL", "HDL", "LDLCALC", "TRIG", "CHOLHDL", "LDLDIRECT" in the last 72 hours. Thyroid function studies: No results for input(s): "TSH", "T4TOTAL", "T3FREE", "THYROIDAB" in the last 72 hours.  Invalid input(s): "FREET3" Anemia work up: No results for input(s): "VITAMINB12", "FOLATE", "FERRITIN", "TIBC", "IRON", "RETICCTPCT" in the last 72 hours. Sepsis Labs: Recent Labs  Lab 08/23/21 1901 08/24/21 0416 08/25/21 0225 08/26/21 0141  PROCALCITON  --  <0.10 <0.10  --   WBC 13.4* 11.6* 9.2 10.1  LATICACIDVEN  --  1.1  --   --      Microbiology Recent Results (from the past 240 hour(s))  Blood Culture (routine x 2)     Status: None   Collection Time: 08/20/21  6:19 PM   Specimen: BLOOD  Result Value  Ref Range Status   Specimen Description BLOOD BLOOD RIGHT HAND  Final   Special Requests   Final    BOTTLES DRAWN AEROBIC ONLY Blood Culture results may not be optimal due to an inadequate volume of blood received in culture bottles   Culture   Final    NO GROWTH 5 DAYS Performed at [facility],  [place] [address] [place], [place] [address]    Report Status 08/25/2021 FINAL  Final  Blood Culture (routine x 2)     Status: None   Collection Time: 08/20/21  6:59 PM   Specimen: BLOOD  Result Value Ref Range Status   Specimen Description BLOOD BLOOD RIGHT ARM  Final   Special Requests   Final    BOTTLES DRAWN AEROBIC AND ANAEROBIC Blood Culture adequate volume   Culture   Final    NO GROWTH 5 DAYS Performed at [facility], [place] [address], [place], [place] [address]    Report Status 08/25/2021 FINAL  Final  Urine Culture     Status: None   Collection Time: 08/21/21 12:37 AM   Specimen: In/Out Cath Urine  Result Value Ref Range Status   Specimen Description IN/OUT CATH URINE  Final   Special Requests NONE  Final   Culture   Final    NO GROWTH Performed at [facility], [place] [address], [place], [place] [address]    Report Status 08/22/2021 FINAL  Final  Blood culture (routine x 2)     Status: None   Collection Time: 08/23/21 11:50 PM   Specimen: BLOOD RIGHT FOREARM  Result Value Ref Range Status   Specimen Description BLOOD RIGHT FOREARM  Final   Special Requests   Final    BOTTLES DRAWN AEROBIC ONLY Blood Culture results may not be optimal due to an inadequate volume of blood received in culture bottles   Culture   Final    NO GROWTH 5 DAYS Performed at [facility], [place] [address], [place], [place] [address]    Report Status 08/29/2021 FINAL  Final  Blood culture (routine x 2)     Status: None   Collection Time: 08/23/21 11:59 PM   Specimen: BLOOD  Result Value Ref Range Status   Specimen Description BLOOD LEFT ANTECUBITAL  Final   Special Requests   Final    BOTTLES DRAWN AEROBIC AND ANAEROBIC Blood Culture adequate volume   Culture   Final    NO GROWTH 5 DAYS Performed at [facility], [place] [address], [place],  [address]    Report Status 08/29/2021 FINAL  Final    Procedures and diagnostic studies:  No results found.    LOS: 7 days    [facility]  [facility]   Pager on [email or web address]. If 7PM-7AM, please contact night-coverage at [email or web address]     08/30/2021, 5:56 PM

## 2021-08-31 MED ORDER — OXYMETAZOLINE HCL 0.05 % NA SOLN
1.0000 | Freq: Two times a day (BID) | NASAL | Status: AC | PRN
Start: 2021-08-31 — End: 2021-09-03
  Filled 2021-08-31: qty 30

## 2021-08-31 NOTE — TOC Progression Note (Addendum)
Transition of Care [facility]) - Progression Note    Patient Details  Name: Mario [name] MRN: [id number] Date of Birth: 09-14-1950  Transition of Care [facility]) CM/SW [place], LCSW Phone Number: 08/31/2021, 9:31 AM  Clinical Narrative:    9:31am-CSW requested [facility] consider patient. Continues with no SNF bed offers.   10:52am-[facility] unable to accept patient.    Expected Discharge Plan: Skilled Nursing Facility Barriers to Discharge: Insurance Authorization, SNF Pending bed offer  Expected Discharge Plan and Services Expected Discharge Plan: [place] In-house Referral: Clinical Social Work   Post Acute Care Choice: [place] Living arrangements for the past 2 months: Apartment                                       Social Determinants of Health (SDOH) Interventions    Readmission Risk Interventions    08/23/2021   11:48 AM 08/05/2021   11:02 AM 06/07/2021    4:15 PM  Readmission Risk Prevention Plan  Transportation Screening Complete Complete Complete  Medication Review Press photographer) Complete Complete Complete  PCP or Specialist appointment within 3-5 days of discharge Complete Complete Complete  HRI or Home Care Consult Complete Complete Complete  SW Recovery Care/Counseling Consult Complete Complete Complete  Palliative Care Screening Not Applicable Not Applicable Not [place] Not Applicable Not Applicable Not Applicable

## 2021-08-31 NOTE — Plan of Care (Signed)
  Problem: Activity: Goal: Ability to tolerate increased activity will improve Outcome: Progressing   Problem: Clinical Measurements: Goal: Ability to maintain a body temperature in the normal range will improve Outcome: Progressing   Problem: Respiratory: Goal: Ability to maintain adequate ventilation will improve Outcome: Progressing   Problem: Education: Goal: Knowledge of General Education information will improve Description: Including pain rating scale, medication(s)/side effects and non-pharmacologic comfort measures Outcome: Progressing   Problem: Health Behavior/Discharge Planning: Goal: Ability to manage health-related needs will improve Outcome: Progressing

## 2021-08-31 NOTE — Progress Notes (Signed)
Patient refused CPAP for the night  

## 2021-08-31 NOTE — Progress Notes (Signed)
Progress Note   Patient: Mario [name] KZL:[id number] DOB: 1950/04/18 DOA: 08/23/2021     8 DOS: the patient was seen and examined on 08/31/2021   Brief hospital course: 71 year old male with medical history significant for PD, chronic hypoxic respiratory failure, malignant non-small cell lung cancer, vasculitis on mycophenolate, history of PE on Eliquis, hypertension, chronic pain, malignant pleural effusion with Pleurx catheter, and OSA on CPAP, who was admitted for chest pain and lightheadedness dehydration and AKI.  Patient has had recurrent falls at home.  Patient lives by himself.  Patient had refused SNF placement on last admission.  Patient was readmitted a day after discharge.  Apparently, patient went home and felt fine but after a few hours when he started ambulating he had left-sided pleuritic chest pain which is acute on chronic and shortness of breath, he came back to the ER and was admitted for further care.  Pleuritic chest pain is chronic.  Patient seems agreeable for SNF placement at this time.  Patient is awaiting SNF placement.   On presentation, pneumonia was suspected on CT chest.  Pulmonologist was consulted and he recommended treating for pneumonia.  He expressed suicidal thoughts which prompted initiation of suicide precautions and a psychiatry consult.  He has been cleared by the psychiatrist and suicide precautions have been discontinued.  Palliative care team was consulted to assist with pain management.   Pt has since completed course of abx and Pleurx cath removed. Pending placement to SNF  Assessment and Plan: Ongoing pleuritic left-sided chest pain in a patient with history of left-sided rib fractures, Metastatic NSCLC; malignant pleural effusion following with Dr. Lona Millard with left-sided Pleurx catheter - Presented with LLL mass in June 2022 s/p SBRT, then developed malignant pleural effusion in May 2023 with PleurX placed on the left side by IR, following with Dr.  Julien Nordmann of oncology who earlier this month gave pt option of hospice referral or palliative systemic chemotherapy with immune therapy. For now, he wants to continue medical treatment.  Pleurx catheter was no longer draining and was subsequently removed 08/30/2021 by IR team..       2.  Probable pneumonia: -CT scan suggested left lower lobe changes appear to be chronic. -Pulmonologist recommended treating for pneumonia for 5 to 7 days. -Patient completed 5-day treatment on 08/28/2021. -Remains afebrile   3.  COPD, chronic hypoxic respiratory failure:  -Patient is on 3 L/min oxygen at home. -Continue bronchodilators as needed. -Stable.   4.  Generalized weakness and deconditioning: -Pursue discharge to skilled nursing facility. -TOC following   5. OSA - CPAP qHS    6. Depression with suicidal thoughts, anxiety: -Patient was evaluated by the psychiatrist.  No indication for involuntary commitment.  Per psychiatrist, okay to discontinue one-to-one sitter and suicide precautions.  Continue psychotropics.   7. Chronic pain -continue analgesics as needed including lidocaine patch   8. Hx of PE - Continue Eliquis        Subjective: Without complaints today.   Physical Exam: Vitals:   08/31/21 0300 08/31/21 0724 08/31/21 1100 08/31/21 1624  BP: 120/65  (!) 146/83 (!) 113/40  Pulse: 79  94 81  Resp: 20  19 16   Temp: 98.1 F (36.7 C)  97.8 F (36.6 C) 98.4 F (36.9 C)  TempSrc: Oral  Oral Oral  SpO2:  96% 99% 96%  Weight:      Height:       General exam: Awake, laying in bed, in nad Respiratory system: Normal respiratory effort,  no wheezing Cardiovascular system: regular rate, s1, s2 Gastrointestinal system: Soft, nondistended, positive BS Central nervous system: CN2-12 grossly intact, strength intact Extremities: Perfused, no clubbing Skin: Normal skin turgor, no notable skin lesions seen Psychiatry: Mood normal // no visual hallucinations   Data Reviewed:  There are no  new results to review at this time.  Family Communication: Pt in room, family not at bedside  Disposition: Status is: Inpatient Remains inpatient appropriate because: Severity of illness, discharge planning  Planned Discharge Destination: Skilled nursing facility    Author: Marylu Lund, MD 08/31/2021 5:06 PM  For on call review [email or web address].

## 2021-09-01 ENCOUNTER — Inpatient Hospital Stay (HOSPITAL_COMMUNITY): Payer: No Typology Code available for payment source

## 2021-09-01 HISTORY — PX: IR REMOVAL OF PLURAL CATH W/CUFF: IMG5346

## 2021-09-01 LAB — CBC
HCT: 32.3 % — ABNORMAL LOW (ref 39.0–52.0)
Hemoglobin: 10.6 g/dL — ABNORMAL LOW (ref 13.0–17.0)
MCH: 28.4 pg (ref 26.0–34.0)
MCHC: 32.8 g/dL (ref 30.0–36.0)
MCV: 86.6 fL (ref 80.0–100.0)
Platelets: 394 10*3/uL (ref 150–400)
RBC: 3.73 MIL/uL — ABNORMAL LOW (ref 4.22–5.81)
RDW: 14.6 % (ref 11.5–15.5)
WBC: 9.7 10*3/uL (ref 4.0–10.5)
nRBC: 0 % (ref 0.0–0.2)

## 2021-09-01 LAB — COMPREHENSIVE METABOLIC PANEL
ALT: 13 U/L (ref 0–44)
AST: 12 U/L — ABNORMAL LOW (ref 15–41)
Albumin: 3 g/dL — ABNORMAL LOW (ref 3.5–5.0)
Alkaline Phosphatase: 70 U/L (ref 38–126)
Anion gap: 11 (ref 5–15)
BUN: 14 mg/dL (ref 8–23)
CO2: 26 mmol/L (ref 22–32)
Calcium: 8.7 mg/dL — ABNORMAL LOW (ref 8.9–10.3)
Chloride: 96 mmol/L — ABNORMAL LOW (ref 98–111)
Creatinine, Ser: 0.93 mg/dL (ref 0.61–1.24)
GFR, Estimated: >60 mL/min (ref >60)
Glucose, Bld: 108 mg/dL — ABNORMAL HIGH (ref 70–99)
Potassium: 4 mmol/L (ref 3.5–5.1)
Sodium: 133 mmol/L — ABNORMAL LOW (ref 135–145)
Total Bilirubin: 0.1 mg/dL — ABNORMAL LOW (ref 0.3–1.2)
Total Protein: 5.7 g/dL — ABNORMAL LOW (ref 6.5–8.1)

## 2021-09-01 MED ORDER — LIDOCAINE HCL 1 % IJ SOLN
INTRAMUSCULAR | Status: AC
  Administered 2021-09-01: 20 mL
  Filled 2021-09-01: qty 20

## 2021-09-01 NOTE — TOC Progression Note (Signed)
Transition of Care [facility]) - Progression Note    Patient Details  Name: Mario [name] MRN: [id number] Date of Birth: Mar 17, 1950  [facility]) CM/SW Contact  Joanne Chars, LCSW Phone Number: 09/01/2021, 8:40 AM  Clinical Narrative:   [facility] considered pt for admit, but cannot take due to pt being [place].      Expected Discharge Plan: Skilled Nursing Facility Barriers to Discharge: Ship broker, SNF Pending bed offer, Awaiting State Approval Forensic scientist)  Expected Discharge Plan and Services Expected Discharge Plan: [place] In-house Referral: Clinical Social Work   Post Acute Care Choice: [place] Living arrangements for the past 2 months: Apartment                                       Social Determinants of Health (SDOH) Interventions    Readmission Risk Interventions    08/23/2021   11:48 AM 08/05/2021   11:02 AM 06/07/2021    4:15 PM  Readmission Risk Prevention Plan  Transportation Screening Complete Complete Complete  Medication Review Press photographer) Complete Complete Complete  PCP or Specialist appointment within 3-5 days of discharge Complete Complete Complete  HRI or Home Care Consult Complete Complete Complete  SW Recovery Care/Counseling Consult Complete Complete Complete  Palliative Care Screening Not Applicable Not Applicable Not [place] Not Applicable Not Applicable Not Applicable

## 2021-09-01 NOTE — Progress Notes (Signed)
Daily Progress Note   Patient Name: Mario [name]       Date: 09/01/2021 DOB: 10-17-1950  Age: 71 y.o. MRN#: [id number] Attending Physician: Jonetta Osgood, MD Primary Care Physician: [facility] Admit Date: 08/23/2021  Reason for Consultation/Follow-up: Establishing goals of care  Subjective: C/o ongoing pain in L side/ribs - sleeping well.  Length of Stay: 9  Current Medications: Scheduled Meds:   acetaminophen  1,000 mg Oral TID   apixaban  5 mg Oral BID   arformoterol  15 mcg Nebulization BID   aspirin EC  81 mg Oral Daily   budesonide  0.5 mg Inhalation Q2000   buPROPion  300 mg Oral Daily   busPIRone  15 mg Oral Daily   cycloSPORINE  1 drop Both Eyes BID   diltiazem  180 mg Oral Daily   DULoxetine  60 mg Oral BID   gabapentin  300 mg Oral BID   lidocaine  2 patch Transdermal Q24H   lurasidone  40 mg Oral QPC supper   melatonin  6 mg Oral QHS   mycophenolate  1,500 mg Oral BID   oxcarbazepine  600 mg Oral Daily   oxcarbazepine  900 mg Oral QHS   oxyCODONE  20 mg Oral Q12H   polyethylene glycol  17 g Oral Daily   senna-docusate  2 tablet Oral BID   sodium chloride flush  3 mL Intravenous Q12H   tamsulosin  0.4 mg Oral Daily   traZODone  75 mg Oral QHS    Continuous Infusions:   PRN Meds: albuterol, guaiFENesin, ondansetron **OR** ondansetron (ZOFRAN) IV, oxyCODONE, oxymetazoline  Physical Exam Constitutional:      General: He is not in acute distress.    Appearance: He is ill-appearing.  Pulmonary:     Effort: Pulmonary effort is normal.  Skin:    General: Skin is warm and dry.  Neurological:     Mental Status: He is alert.             Vital Signs: BP 131/64 (BP Location: Right Arm)   Pulse (!) 101   Temp 98.4 F (36.9 C) (Oral)   Resp 18   Ht  6' 2" (1.88 m)   Wt (!) 147 kg   SpO2 95%   BMI 41.61 kg/m  SpO2: SpO2: 95 % O2 Device: O2 Device: Nasal Cannula O2 Flow Rate: O2 Flow Rate (L/min): 3 L/min  Intake/output summary:  Intake/Output Summary (Last 24 hours) at 09/01/2021 1304 Last data filed at 09/01/2021 1201 Gross per 24 hour  Intake 360 ml  Output 1 ml  Net 359 ml   LBM: Last BM Date : 08/30/21 Baseline Weight: Weight: (!) 147 kg Most recent weight: Weight: (!) 147 kg       Palliative Assessment/Data: PPS 40%      Patient Active Problem List   Diagnosis Date Noted   Rib fractures 08/24/2021   Pleural effusion, left 08/20/2021   Metastatic carcinoma ([place]) 08/07/2021   Non-small cell lung cancer, left ([place]) 08/07/2021   Chronic respiratory failure with hypoxia ([place]) 08/07/2021   Rib pain 08/03/2021   Encounter for antineoplastic chemotherapy 07/20/2021   Leg paresthesia 06/17/2021   Sepsis ([place])  06/17/2021   Obesity, Class III, BMI 40-49.9 (morbid obesity) ([place]) 06/17/2021   Pneumonia 06/17/2021   Hypertension    Allergic rhinitis    History of pulmonary embolism    Hyponatremia 05/20/2021   Mood disorder ([place]) 02/03/2021   Non-small cell cancer of left lung ([place]) 09/21/2020   Lung nodule 07/30/2020   Mass of left lung    Constipation    HCAP (healthcare-associated pneumonia) 04/03/2020   COPD with acute exacerbation ([place]) 01/05/2020   Vertigo 12/26/2019   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   Stage 3a chronic kidney disease ([place]) 12/26/2019   Abnormal CT of the chest 12/26/2019   COPD (chronic obstructive pulmonary disease) ([place])    Hyperglycemia 06/23/2019   OSA (obstructive sleep apnea) 06/23/2019   Major depressive disorder, recurrent episode ([place]) 10/12/2018   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   Cellulitis of both feet 07/16/2017   DM2 (diabetes mellitus, type 2) ([place]) 07/16/2017   HLA B27 (HLA B27 positive) 07/16/2017   Hyperkalemia    Hypoxia     Dyspnea 05/23/2014   COPD exacerbation ([place]) 05/23/2014   Malingering 01/21/2013   Depression 01/11/2013   Tobacco abuse 01/11/2013   Obesity, unspecified 01/11/2013   Hypertension associated with diabetes ([place]) 01/10/2013    Palliative Care Assessment & Plan   HPI: 70 y.o. male  with past medical history of COPD, malignant non-small cell lung cancer, vasculitis, PE on Eliquis, hypertension, chronic pain, malignant pleural effusion with Pleurx, OSA, and multiple hospitalizations admitted on 08/23/2021 with increased shortness of breath.  Of note, patient just discharged 3 to 4 hours prior to readmission.  Patient with ongoing pleuritic left-sided chest pain likely related to history of left-sided rib fractures related to a fall and also metastatic non-small cell lung cancer.  Also has malignant pleural effusion and Pleurx catheter on left-side of chest.  PMT consulted to discuss goals of care.   Of note, patient with 8 admissions and 2 ED visits in past 6 months.  Assessment: F/u today with patient - having some acute pain - we again reviewed that he has PRN oxy IR available to him that he is not using that often - he was under the impression he could not have it following his extended release oxycodone but I explained his extended release was meant to last throughout the day and the oxy IR could be used every 4 hours in addition to long acting oxycodone for any acute pain. He expresses understanding - discussed with nurse as well. He tells me he is sleeping well. Had BM last night.  He tells me he is unsure if he still wants to go to SNF - we reviewed previously stated goals to go to SNF to work with therapy and hope to follow-up with oncology for ongoing treatment options.  He tells me this remains his goal.  We discussed that in the past when he has went home he is readmitted to the hospital and I fear that if this were to happen again this would only be further setback to him achieving his  goals.  He would like to further discuss his options with TOC team.  I discussed this with TOC team.  Recommendations/Plan: Encouraged patient to use Oxy IR for acute pain as he continues to complain of uncontrolled pain TOC to follow-up with patient regarding discharge plans Having regular bowel movements -continue bowel regimen Continue current pain regimen: OxyContin 20 mg twice daily, Oxy IR every 4  hours as needed, scheduled Tylenol 1000 mg 3 times daily, lidocaine patch To follow-up with palliative in cancer center MOST form completed, CODE STATUS DNR -Patient would want his next of kin: Daughter [name] -to serve as medical decision-maker if he were ever unable  Code Status: DNR  Discharge Planning: [facility] for rehab with Palliative care service follow-up  Care plan was discussed with patient, TOC, RN  Thank you for allowing the Palliative Medicine Team to assist in the care of this patient.  *Please note that this is a verbal dictation therefore any spelling or grammatical errors are due to the "[place]" system interpretation.  Juel Burrow, DNP, [facility] Palliative Medicine Team Team Phone # [phone]  Pager [phone]

## 2021-09-01 NOTE — Procedures (Signed)
PROCEDURE SUMMARY:  Successful removal of tunneled pleural catheter.  Patient tolerated well.  EBL < 5 mL  See full dictation in Imaging for details.  Evertte Sones S Lisbeth Puller PA-C 09/01/2021 10:13 AM

## 2021-09-01 NOTE — Progress Notes (Signed)
PROGRESS NOTE        PATIENT DETAILS Name: Mario [name] Age: 71 y.o. Sex: male Date of Birth: 10/04/1950 Admit Date: 08/23/2021 Admitting Physician Vianne Bulls, MD ZOX:[facility]  Brief Summary: Patient is a 71 y.o.  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   Significant events: 6/10-6/13>> hospitalization for PNA, acute on chronic hypoxic 6/23-6/26>> hospitalization for dehydration/hyponatremia/AKI/COPD exacerbation-refused SNF-went home. 6/26>> back to the ED a few hours after discharge-with worsening shortness of breath-thought to have pneumonia and readmitted to the [facility] service  Significant studies: 6/26>> CTA chest: No PE, new acute left rib fracture, stable left seventh rib fracture, loculated pleural effusion has decreased in size-Pleurx catheter in place.  New left lower lobe airspace consolidation.  Significant microbiology data: 6/26>> blood culture: No growth  Procedures: 7/5>> left Pleurx catheter removed by IR.  Consults: PCCM, palliative care, psychiatry  Subjective: Lying comfortably in bed-denies any chest pain or shortness of breath.  Claims he has some sharp shooting pain in the left flank/lower chest area.  Appears comfortable.  Claims he does not want to go to SNF-and is roommate that was looking after his apartment is now in the hospital and is being moved to a skilled nursing facility.  He thinks he can manage at home.  Objective: Vitals: Blood pressure 131/64, pulse (!) 101, temperature 98.4 F (36.9 C), temperature source Oral, resp. rate 18,  height 6\' 2"  (1.88 m), weight (!) 147 kg, SpO2 95 %.   Exam: Gen Exam:Alert awake-not in any distress HEENT:atraumatic, normocephalic Chest: B/L clear to auscultation anteriorly CVS:S1S2 regular Abdomen:soft non tender, non distended Extremities:no edema Neurology: Non focal Skin: no rash  Pertinent Labs/Radiology:    Latest Ref Rng & Units 09/01/2021    1:41 AM 08/26/2021    1:41 AM 08/25/2021    2:25 AM  CBC  WBC 4.0 - 10.5 K/uL 9.7  10.1  9.2   Hemoglobin 13.0 - 17.0 g/dL 10.6  10.8  10.8   Hematocrit 39.0 - 52.0 % 32.3  34.2  34.0   Platelets 150 - 400 K/uL 394  435  449     Lab Results  Component Value Date   NA 133 (L) 09/01/2021   K 4.0 09/01/2021   CL 96 (L) 09/01/2021   CO2 26 09/01/2021      Assessment/Plan: PNA: Stable-afebrile-no leukocytosis-has completed a course of antibiotics.  Left-sided pleuritic chest pain chest pain: Due to rib fractures-underlying malignancy-continue supportive care.  CTA chest negative for PE-furthermore already on Eliquis.  Palliative care following and managing narcotic regimen.  Malignant left pleural effusion: Evaluated by IR-since Pleurx catheter not in place-removed on 7/5.  Awaiting repeat chest x-ray post tube removal.  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 Julien Nordmann had spoken with the patient and given him options for hospice versus palliative chemotherapy-patient chose the latter-we will need outpatient follow-up with Dr. Earlie Server postdischarge.  Depression/anxiety with suicidal thoughts: Evaluated by psychiatry during the earlier part of this hospital stay-cleared for [place]  need for inpatient psych treatment.  Continue Wellbutrin, Cymbalta, Latuda, BuSpar  History of vasculitis/ankylosing spondylitis: On CellCept-apparently no longer on methotrexate  COPD with chronic hypoxic respiratory failure on 3 L of oxygen at home: Not in exacerbation-continue  bronchodilators  History of VTE: Continue Eliquis  OSA: CPAP nightly  Chronic pain: Likely related to malignancy-palliative care following-on OxyContin, scheduled Tylenol-and oxycodone for breakthrough pain.  BPH: Continue Flomax  Morbid Obesity: Estimated body mass index is 41.61 kg/m as calculated from the following:   Height as of this encounter: 6\' 2"  (1.88 m).   Weight as of this encounter: 147 kg.   Code status:   Code Status: DNR   DVT Prophylaxis: apixaban (ELIQUIS) tablet 5 mg    Family Communication: None at bedside   Disposition Plan: Status is: Inpatient Remains inpatient appropriate because: Awaiting SNF placement-however patient having second thoughts whether he would want to go to SNF.  Will rediscuss tomorrow.   Planned Discharge Destination:Skilled nursing facility versus home health   Diet: Diet Order             Diet regular Room service appropriate? Yes; Fluid consistency: Thin  Diet effective now                     Antimicrobial agents: Anti-infectives (From admission, onward)    Start     Dose/Rate Route Frequency Ordered Stop   08/27/21 1000  cefdinir (OMNICEF) capsule 300 mg        300 mg Oral Every 12 hours 08/26/21 1136 08/28/21 2152   08/26/21 0600  azithromycin (ZITHROMAX) tablet 500 mg        500 mg Oral Every 24 hours 08/25/21 0851 08/28/21 0535   08/24/21 0415  cefTRIAXone (ROCEPHIN) 2 g in sodium chloride 0.9 % 100 mL IVPB  Status:  Discontinued        2 g 200 mL/hr over 30 Minutes Intravenous Every 24 hours 08/24/21 0402 08/26/21 1135   08/24/21 0415  azithromycin (ZITHROMAX) 500 mg in sodium chloride 0.9 % 250 mL IVPB  Status:  Discontinued        500 mg 250 mL/hr over 60 Minutes Intravenous Every 24 hours 08/24/21 0402 08/25/21 0850   08/23/21 2345  ceFEPIme (MAXIPIME) 2 g in sodium chloride 0.9 % 100 mL IVPB        2 g 200 mL/hr over 30 Minutes Intravenous  Once 08/23/21 2332 08/24/21 0048   08/23/21 2330  vancomycin  (VANCOREADY) IVPB 2000 mg/400 mL        2,000 mg 200 mL/hr over 120 Minutes Intravenous  Once 08/23/21 2332 08/24/21 0302        MEDICATIONS: Scheduled Meds:  acetaminophen  1,000 mg Oral TID   apixaban  5 mg Oral BID   arformoterol  15 mcg Nebulization BID   aspirin EC  81 mg Oral Daily   budesonide  0.5 mg Inhalation Q2000   buPROPion  300 mg Oral Daily   busPIRone  15 mg Oral Daily   cycloSPORINE  1 drop Both Eyes BID   diltiazem  180 mg Oral Daily   DULoxetine  60 mg Oral BID   gabapentin  300 mg Oral BID   lidocaine  2 patch Transdermal Q24H   lurasidone  40 mg Oral QPC supper   melatonin  6 mg Oral QHS   mycophenolate  1,500 mg Oral BID   oxcarbazepine  600 mg Oral Daily   oxcarbazepine  900 mg Oral QHS  oxyCODONE  20 mg Oral Q12H   polyethylene glycol  17 g Oral Daily   senna-docusate  2 tablet Oral BID   sodium chloride flush  3 mL Intravenous Q12H   tamsulosin  0.4 mg Oral Daily   traZODone  75 mg Oral QHS   Continuous Infusions: PRN Meds:.albuterol, guaiFENesin, ondansetron **OR** ondansetron (ZOFRAN) IV, oxyCODONE, oxymetazoline   I have personally reviewed following labs and imaging studies  LABORATORY DATA: CBC: Recent Labs  Lab 08/26/21 0141 09/01/21 0141  WBC 10.1 9.7  HGB 10.8* 10.6*  HCT 34.2* 32.3*  MCV 89.1 86.6  PLT 435* 696    Basic Metabolic Panel: Recent Labs  Lab 08/26/21 0141 09/01/21 0141  NA 135 133*  K 4.2 4.0  CL 103 96*  CO2 25 26  GLUCOSE 112* 108*  BUN 12 14  CREATININE 0.76 0.93  CALCIUM 8.7* 8.7*  MG 2.0  --     GFR: Estimated Creatinine Clearance: 114.6 mL/min (by C-G formula based on SCr of 0.93 mg/dL).  Liver Function Tests: Recent Labs  Lab 09/01/21 0141  AST 12*  ALT 13  ALKPHOS 70  BILITOT 0.1*  PROT 5.7*  ALBUMIN 3.0*   No results for input(s): "LIPASE", "AMYLASE" in the last 168 hours. No results for input(s): "AMMONIA" in the last 168 hours.  Coagulation Profile: No results for input(s):  "INR", "PROTIME" in the last 168 hours.  Cardiac Enzymes: No results for input(s): "CKTOTAL", "CKMB", "CKMBINDEX", "TROPONINI" in the last 168 hours.  BNP (last 3 results) No results for input(s): "PROBNP" in the last 8760 hours.  Lipid Profile: No results for input(s): "CHOL", "HDL", "LDLCALC", "TRIG", "CHOLHDL", "LDLDIRECT" in the last 72 hours.  Thyroid Function Tests: No results for input(s): "TSH", "T4TOTAL", "FREET4", "T3FREE", "THYROIDAB" in the last 72 hours.  Anemia Panel: No results for input(s): "VITAMINB12", "FOLATE", "FERRITIN", "TIBC", "IRON", "RETICCTPCT" in the last 72 hours.  Urine analysis:    Component Value Date/Time   COLORURINE YELLOW 08/21/2021 0041   APPEARANCEUR HAZY (A) 08/21/2021 0041   LABSPEC 1.017 08/21/2021 0041   PHURINE 5.0 08/21/2021 0041   GLUCOSEU NEGATIVE 08/21/2021 0041   HGBUR NEGATIVE 08/21/2021 0041   BILIRUBINUR NEGATIVE 08/21/2021 0041   KETONESUR NEGATIVE 08/21/2021 0041   PROTEINUR NEGATIVE 08/21/2021 0041   UROBILINOGEN 1.0 01/12/2013 0024   NITRITE NEGATIVE 08/21/2021 0041   LEUKOCYTESUR NEGATIVE 08/21/2021 0041    Sepsis Labs: Lactic Acid, Venous    Component Value Date/Time   LATICACIDVEN 1.1 08/24/2021 0416    MICROBIOLOGY: Recent Results (from the past 240 hour(s))  Blood culture (routine x 2)     Status: None   Collection Time: 08/23/21 11:50 PM   Specimen: BLOOD RIGHT FOREARM  Result Value Ref Range Status   Specimen Description BLOOD RIGHT FOREARM  Final   Special Requests   Final    BOTTLES DRAWN AEROBIC ONLY Blood Culture results may not be optimal due to an inadequate volume of blood received in culture bottles   Culture   Final    NO GROWTH 5 DAYS Performed at [facility], [place] [address] [place], [place] [address]    Report Status 08/29/2021 FINAL  Final  Blood culture (routine x 2)     Status: None   Collection Time: 08/23/21 11:59 PM   Specimen: BLOOD  Result Value Ref Range Status    Specimen Description BLOOD LEFT ANTECUBITAL  Final   Special Requests   Final    BOTTLES DRAWN AEROBIC AND  ANAEROBIC Blood Culture adequate volume   Culture   Final    NO GROWTH 5 DAYS Performed at [facility], [place] [address], [place], [place] [address]    Report Status 08/29/2021 FINAL  Final    RADIOLOGY STUDIES/RESULTS: IR Removal Of Plural Cath W/Cuff  Result Date: 09/01/2021 INDICATION: Recurrent malignant left pleural effusion with previous placement of tunneled pleural catheter. Catheter no longer functioning. Patient requesting removal. It was initially placed by Dr. Annamaria Boots on August 04, 2021. EXAM: REMOVAL OF TUNNELED PLEURAL CATHETER MEDICATIONS: 1% lidocaine 8 mL COMPLICATIONS: None immediate. PROCEDURE: Informed written consent was obtained from the patient following an explanation of the procedure, risks, benefits and alternatives to treatment. A time out was performed prior to the initiation of the procedure. Maximal barrier sterile technique was utilized including caps, mask, sterile gloves, large sterile drape, and hand hygiene. ChloraPrep was used to prep the patient's left lateral chest and existing catheter. The cuff was palpated about 2 inches above the exit site of the catheter. 1% lidocaine was injected around the cuff of the catheter . Using a #15 blade, a small incision was made over the cuff. The catheter was dissected out using scissors and curved hemostats until the cuff was freed from the surrounding fibrous sheath and the catheter was removed without difficulty. Next a 3-0 Monocryl suture was used to approximate on the skin only at the incision site. Clean dry dressing was placed over the sites. The patient tolerated the procedure well without immediate post procedural complication. IMPRESSION: Successful removal of tunneled pleural catheter. Procedure performed by: Gareth Eagle, PA-C Electronically Signed   By: Michaelle Birks M.D.   On: 09/01/2021 10:52     LOS: 9 days    Oren Binet, MD  [facility]    To contact the attending provider between 7A-7P or the covering provider during after hours 7P-7A, please log into the web site [email or web address] and access using universal [place] password for that web site. If you do not have the password, please call the hospital operator.  09/01/2021, 1:31 PM

## 2021-09-01 NOTE — Progress Notes (Signed)
Occupational Therapy Treatment Patient Details Name: Mario [name] MRN: [id number] DOB: 1950-09-06 Today's Date: 09/01/2021   History of present illness 71 y/o male admitted 6/26 after being discharged the same day, secondary to worsening L chest pain and continued PNA. PMH includes COPD, pleural effusion s/p pleurx catheter, lung cancer, HTN, and PE.   OT comments  Pt complains of sharp sudden onset pain on L side of abdomen at chest level. MD and RN notified. Pt tolerates 2.5 L [place] 96% during session with movement and adls. Pt progressing well and concerned over snf bed placement.    Recommendations for follow up therapy are one component of a multi-disciplinary discharge planning process, led by the attending physician.  Recommendations may be updated based on patient status, additional functional criteria and insurance authorization.    Follow Up Recommendations  Skilled nursing-short term rehab (<3 hours/day)    Assistance Recommended at Discharge Frequent or constant Supervision/Assistance  Patient can return home with the following  A little help with walking and/or transfers;A little help with bathing/dressing/bathroom;Assistance with cooking/housework;Direct supervision/assist for financial management;Assist for transportation;Help with stairs or ramp for entrance;Direct supervision/assist for medications management   Equipment Recommendations  None recommended by OT    Recommendations for Other Services Rehab consult    Precautions / Restrictions Precautions Precautions: Fall Precaution Comments: monitor O2, pt reports recent L rib fxs and pleurx catheter Restrictions Weight Bearing Restrictions: No       Mobility Bed Mobility Overal bed mobility: Independent                  Transfers Overall transfer level: Needs assistance   Transfers: Sit to/from Stand Sit to Stand: Supervision                 Balance                                            ADL either performed or assessed with clinical judgement   ADL Overall ADL's : Needs assistance/impaired Eating/Feeding: Independent;Sitting   Grooming: Oral care;Wash/dry face;Modified independent;Standing;Wash/dry hands Grooming Details (indicate cue type and reason): pt reports increased discomfort with static standing.                 Toilet Transfer: Supervision/safety;Ambulation Toilet Transfer Details (indicate cue type and reason): good return demo of o2 cord management           General ADL Comments: pt demonstrates DOE 1 out 4 this session with movement. pt completed 5 laps in the room door to window and back. pt self reports movement in the room this morning as well. pt manages oxygen well without cues. pt encouraged to continue movement. pt prefers door shut and less sound from the unit.    Extremity/Trunk Assessment Upper Extremity Assessment Upper Extremity Assessment: Overall WFL for tasks assessed   Lower Extremity Assessment Lower Extremity Assessment: Defer to PT evaluation        Vision       Perception     Praxis      Cognition Arousal/Alertness: Awake/alert Behavior During Therapy: WFL for tasks assessed/performed Overall Cognitive Status: Within Functional Limits for tasks assessed  Exercises      Shoulder Instructions       General Comments 2.5L [place] 96%    Pertinent Vitals/ Pain       Pain Assessment Pain Assessment: Faces Faces Pain Scale: Hurts whole lot Pain Location: L side pain / back pain Pain Descriptors / Indicators: Sharp, Grimacing, Guarding, Discomfort Pain Intervention(s): Monitored during session, Repositioned, Premedicated before session, Limited activity within patient's tolerance  Home Living                                          Prior Functioning/Environment              Frequency  Min 2X/week         Progress Toward Goals  OT Goals(current goals can now be found in the care plan section)  Progress towards OT goals: Progressing toward goals  Acute Rehab OT Goals Patient Stated Goal: to figure out where i can go. (awaiting snf location) OT Goal Formulation: With patient Time For Goal Achievement: 09/08/21 Potential to Achieve Goals: Good ADL Goals Pt Will Perform Grooming: with set-up;standing (met) Pt Will Perform Lower Body Dressing: with min assist;sit to/from stand;with adaptive equipment Pt Will Transfer to Toilet: with supervision;ambulating;regular height toilet (met) Pt Will Perform Toileting - Clothing Manipulation and hygiene: with modified independence;sit to/from stand Pt Will Perform Tub/Shower Transfer: with modified independence;tub bench Pt/caregiver will Perform Home Exercise Program: Increased strength;Both right and left upper extremity;With Supervision;With theraband Additional ADL Goal #1: pt will demonstrate 2 energy conservation strategies with adl sink level Additional ADL Goal #2: pt will complete bed mobility mod I with HOB 20 degrees or less (met)  Plan Discharge plan remains appropriate;Frequency remains appropriate    Co-evaluation                 AM-PAC OT "6 Clicks" Daily Activity     Outcome Measure   Help from another person eating meals?: None Help from another person taking care of personal grooming?: A Little Help from another person toileting, which includes using toliet, bedpan, or urinal?: A Little Help from another person bathing (including washing, rinsing, drying)?: A Little Help from another person to put on and taking off regular upper body clothing?: A Little Help from another person to put on and taking off regular lower body clothing?: A Lot 6 Click Score: 18    End of Session Equipment Utilized During Treatment: Oxygen  OT Visit Diagnosis: Unsteadiness on feet (R26.81);Other abnormalities of gait and mobility  (R26.89);Muscle weakness (generalized) (M62.81)   Activity Tolerance Patient tolerated treatment well   Patient Left in bed;with call bell/phone within reach;with nursing/sitter in room   Nurse Communication Mobility status        Time: 4401-0272 OT Time Calculation (min): 18 min  Charges: OT General Charges $OT Visit: 1 Visit OT Treatments $Self Care/Home Management : 8-22 mins   Mario [name], OTR/L  Acute Rehabilitation Services Office: [phone] .   Jeri Modena 09/01/2021, 11:55 AM

## 2021-09-02 NOTE — Progress Notes (Signed)
PROGRESS NOTE        PATIENT DETAILS Name: Mario [name] Age: 71 y.o. Sex: male Date of Birth: [birth year] Admit Date: 08/23/2021 Admitting Physician Vianne Bulls, MD [id number], [place]  Brief Summary: Patient is a 71 y.o.  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   Significant events: 6/10-6/13>> hospitalization for PNA, acute on chronic hypoxic 6/23-6/26>> hospitalization for dehydration/hyponatremia/AKI/COPD exacerbation-refused SNF-went home. 6/26>> back to the ED a few hours after discharge-with worsening shortness of breath-thought to have pneumonia and readmitted to the [facility] service  Significant studies: 6/26>> CTA chest: No PE, new acute left rib fracture, stable left seventh rib fracture, loculated pleural effusion has decreased in size-Pleurx catheter in place.  New left lower lobe airspace consolidation.  Significant microbiology data: 6/26>> blood culture: No growth  Procedures: 7/5>> left Pleurx catheter removed by IR.  Consults: PCCM, palliative care, psychiatry  Subjective: /Pleuritic-left-sided chest pain continues-unchanged over the past week or so.  Has changed his mind-and thinks he needs to go to SNF  Objective: Vitals: Blood pressure (!) 142/76, pulse 96, temperature 97.9 F (36.6 C), temperature source Oral, resp. rate 20, height 6\' 2"  (1.88 m), weight (!) 147 kg, SpO2 91 %.   Exam: Gen Exam:Alert awake-not in any distress HEENT:atraumatic, normocephalic Chest: B/L clear to auscultation anteriorly CVS:S1S2 regular Abdomen:soft non tender, non  distended Extremities:no edema Neurology: Non focal Skin: no rash  Pertinent Labs/Radiology:    Latest Ref Rng & Units 09/01/2021    1:41 AM 08/26/2021    1:41 AM 08/25/2021    2:25 AM  CBC  WBC 4.0 - 10.5 K/uL 9.7  10.1  9.2   Hemoglobin 13.0 - 17.0 g/dL 10.6  10.8  10.8   Hematocrit 39.0 - 52.0 % 32.3  34.2  34.0   Platelets 150 - 400 K/uL 394  435  449     Lab Results  Component Value Date   NA 133 (L) 09/01/2021   K 4.0 09/01/2021   CL 96 (L) 09/01/2021   CO2 26 09/01/2021      Assessment/Plan: PNA: Stable-afebrile-no leukocytosis-has completed a course of antibiotics.  Left-sided pleuritic chest pain: Due to rib fractures-underlying malignancy-continue supportive care.  CTA chest negative for PE-furthermore already on Eliquis.  Palliative care following and managing narcotic regimen.  Malignant left pleural effusion: Evaluated by IR-since Pleurx catheter not in place-removed on 7/5.  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 Julien Nordmann had spoken with the patient and given him options for hospice versus palliative chemotherapy-patient chose the latter-we will need outpatient follow-up with Dr. Earlie Server postdischarge.  Depression/anxiety with suicidal thoughts: Evaluated by psychiatry during the earlier part of this hospital stay-cleared for discharge-no need for inpatient psych treatment.  Continue Wellbutrin, Cymbalta, Latuda, BuSpar  History of vasculitis/ankylosing spondylitis: On CellCept-apparently no longer on methotrexate  COPD with chronic hypoxic respiratory failure on 3 L of oxygen at home: Not in exacerbation-continue bronchodilators  History of VTE: Continue Eliquis  OSA: CPAP nightly  Chronic pain: Likely related to  malignancy-palliative care following-on OxyContin, scheduled Tylenol-and oxycodone for breakthrough pain.  BPH: Continue Flomax  Morbid Obesity: Estimated body mass index is 41.61  kg/m as calculated from the following:   Height as of this encounter: 6\' 2"  (1.88 m).   Weight as of this encounter: 147 kg.   Code status:   Code Status: DNR   DVT Prophylaxis: apixaban (ELIQUIS) tablet 5 mg    Family Communication: None at bedside   Disposition Plan: Status is: Inpatient Remains inpatient appropriate because: Awaiting SNF bed-medically stable for discharge.   Planned Discharge Destination:Skilled nursing facility    Diet: Diet Order             Diet regular Room service appropriate? Yes; Fluid consistency: Thin  Diet effective now                     Antimicrobial agents: Anti-infectives (From admission, onward)    Start     Dose/Rate Route Frequency Ordered Stop   08/27/21 1000  cefdinir (OMNICEF) capsule 300 mg        300 mg Oral Every 12 hours 08/26/21 1136 08/28/21 2152   08/26/21 0600  azithromycin (ZITHROMAX) tablet 500 mg        500 mg Oral Every 24 hours 08/25/21 0851 08/28/21 0535   08/24/21 0415  cefTRIAXone (ROCEPHIN) 2 g in sodium chloride 0.9 % 100 mL IVPB  Status:  Discontinued        2 g 200 mL/hr over 30 Minutes Intravenous Every 24 hours 08/24/21 0402 08/26/21 1135   08/24/21 0415  azithromycin (ZITHROMAX) 500 mg in sodium chloride 0.9 % 250 mL IVPB  Status:  Discontinued        500 mg 250 mL/hr over 60 Minutes Intravenous Every 24 hours 08/24/21 0402 08/25/21 0850   08/23/21 2345  ceFEPIme (MAXIPIME) 2 g in sodium chloride 0.9 % 100 mL IVPB        2 g 200 mL/hr over 30 Minutes Intravenous  Once 08/23/21 2332 08/24/21 0048   08/23/21 2330  vancomycin (VANCOREADY) IVPB 2000 mg/400 mL        2,000 mg 200 mL/hr over 120 Minutes Intravenous  Once 08/23/21 2332 08/24/21 0302        MEDICATIONS: Scheduled Meds:  acetaminophen  1,000 mg Oral TID   apixaban  5 mg Oral BID   arformoterol  15 mcg Nebulization BID   aspirin EC  81 mg Oral Daily   budesonide  0.5 mg Inhalation Q2000   buPROPion  300 mg Oral Daily   busPIRone   15 mg Oral Daily   cycloSPORINE  1 drop Both Eyes BID   diltiazem  180 mg Oral Daily   DULoxetine  60 mg Oral BID   gabapentin  300 mg Oral BID   lidocaine  2 patch Transdermal Q24H   lurasidone  40 mg Oral QPC supper   melatonin  6 mg Oral QHS   mycophenolate  1,500 mg Oral BID   oxcarbazepine  600 mg Oral Daily   oxcarbazepine  900 mg Oral QHS   oxyCODONE  20 mg Oral Q12H   polyethylene glycol  17 g Oral Daily   senna-docusate  2 tablet Oral BID   sodium chloride flush  3 mL Intravenous Q12H   tamsulosin  0.4 mg Oral Daily   traZODone  75 mg Oral QHS   Continuous Infusions: PRN Meds:.albuterol, guaiFENesin, ondansetron **OR** ondansetron (ZOFRAN) IV, oxyCODONE, oxymetazoline   I have personally reviewed  following labs and imaging studies  LABORATORY DATA: CBC: Recent Labs  Lab 09/01/21 0141  WBC 9.7  HGB 10.6*  HCT 32.3*  MCV 86.6  PLT 394     Basic Metabolic Panel: Recent Labs  Lab 09/01/21 0141  NA 133*  K 4.0  CL 96*  CO2 26  GLUCOSE 108*  BUN 14  CREATININE 0.93  CALCIUM 8.7*     GFR: Estimated Creatinine Clearance: 114.6 mL/min (by C-G formula based on SCr of 0.93 mg/dL).  Liver Function Tests: Recent Labs  Lab 09/01/21 0141  AST 12*  ALT 13  ALKPHOS 70  BILITOT 0.1*  PROT 5.7*  ALBUMIN 3.0*    No results for input(s): "LIPASE", "AMYLASE" in the last 168 hours. No results for input(s): "AMMONIA" in the last 168 hours.  Coagulation Profile: No results for input(s): "INR", "PROTIME" in the last 168 hours.  Cardiac Enzymes: No results for input(s): "CKTOTAL", "CKMB", "CKMBINDEX", "TROPONINI" in the last 168 hours.  BNP (last 3 results) No results for input(s): "PROBNP" in the last 8760 hours.  Lipid Profile: No results for input(s): "CHOL", "HDL", "LDLCALC", "TRIG", "CHOLHDL", "LDLDIRECT" in the last 72 hours.  Thyroid Function Tests: No results for input(s): "TSH", "T4TOTAL", "FREET4", "T3FREE", "THYROIDAB" in the last 72  hours.  Anemia Panel: No results for input(s): "VITAMINB12", "FOLATE", "FERRITIN", "TIBC", "IRON", "RETICCTPCT" in the last 72 hours.  Urine analysis:    Component Value Date/Time   COLORURINE YELLOW 08/21/2021 0041   APPEARANCEUR HAZY (A) 08/21/2021 0041   LABSPEC 1.017 08/21/2021 0041   PHURINE 5.0 08/21/2021 0041   GLUCOSEU NEGATIVE 08/21/2021 0041   HGBUR NEGATIVE 08/21/2021 0041   BILIRUBINUR NEGATIVE 08/21/2021 0041   KETONESUR NEGATIVE 08/21/2021 0041   PROTEINUR NEGATIVE 08/21/2021 0041   UROBILINOGEN 1.0 01/12/2013 0024   NITRITE NEGATIVE 08/21/2021 0041   LEUKOCYTESUR NEGATIVE 08/21/2021 0041    Sepsis Labs: Lactic Acid, Venous    Component Value Date/Time   LATICACIDVEN 1.1 08/24/2021 0416    MICROBIOLOGY: Recent Results (from the past 240 hour(s))  Blood culture (routine x 2)     Status: None   Collection Time: 08/23/21 11:50 PM   Specimen: BLOOD RIGHT FOREARM  Result Value Ref Range Status   Specimen Description BLOOD RIGHT FOREARM  Final   Special Requests   Final    BOTTLES DRAWN AEROBIC ONLY Blood Culture results may not be optimal due to an inadequate volume of blood received in culture bottles   Culture   Final    NO GROWTH 5 DAYS Performed at [facility], [place] [address] [place], [place] [address]    Report Status 08/29/2021 FINAL  Final  Blood culture (routine x 2)     Status: None   Collection Time: 08/23/21 11:59 PM   Specimen: BLOOD  Result Value Ref Range Status   Specimen Description BLOOD LEFT ANTECUBITAL  Final   Special Requests   Final    BOTTLES DRAWN AEROBIC AND ANAEROBIC Blood Culture adequate volume   Culture   Final    NO GROWTH 5 DAYS Performed at [facility], [place] [address], [place], [place] [address]    Report Status 08/29/2021 FINAL  Final    RADIOLOGY STUDIES/RESULTS: DG Chest 1 View  Result Date: 09/01/2021 CLINICAL DATA:  Status post left PleurX catheter removal. EXAM: CHEST  1 VIEW COMPARISON:  Chest  x-ray dated August 28, 2021. FINDINGS: Interval removal of the left-sided PleurX catheter. No pneumothorax. Unchanged small loculated left  pleural effusion and left base bulla with air-fluid level. Unchanged left basilar airspace disease and/or atelectasis. The lungs remain hyperinflated with chronically coarsened interstitial markings. Stable cardiomediastinal silhouette with normal heart size. IMPRESSION: 1. Interval left PleurX catheter removal. No pneumothorax. 2. Otherwise unchanged exam. Electronically Signed   By: Titus Dubin M.D.   On: 09/01/2021 15:50   IR Removal Of Plural Cath W/Cuff  Result Date: 09/01/2021 INDICATION: Recurrent malignant left pleural effusion with previous placement of tunneled pleural catheter. Catheter no longer functioning. Patient requesting removal. It was initially placed by Dr. Annamaria Boots on August 04, 2021. EXAM: REMOVAL OF TUNNELED PLEURAL CATHETER MEDICATIONS: 1% lidocaine 8 mL COMPLICATIONS: None immediate. PROCEDURE: Informed written consent was obtained from the patient following an explanation of the procedure, risks, benefits and alternatives to treatment. A time out was performed prior to the initiation of the procedure. Maximal barrier sterile technique was utilized including caps, mask, sterile gloves, large sterile drape, and hand hygiene. ChloraPrep was used to prep the patient's left lateral chest and existing catheter. The cuff was palpated about 2 inches above the exit site of the catheter. 1% lidocaine was injected around the cuff of the catheter . Using a #15 blade, a small incision was made over the cuff. The catheter was dissected out using scissors and curved hemostats until the cuff was freed from the surrounding fibrous sheath and the catheter was removed without difficulty. Next a 3-0 Monocryl suture was used to approximate on the skin only at the incision site. Clean dry dressing was placed over the sites. The patient tolerated the procedure well without  immediate post procedural complication. IMPRESSION: Successful removal of tunneled pleural catheter. Procedure performed by: Gareth Eagle, PA-C Electronically Signed   By: Michaelle Birks M.D.   On: 09/01/2021 10:52     LOS: 10 days   Oren Binet, MD  [facility]    To contact the attending provider between 7A-7P or the covering provider during after hours 7P-7A, please log into the web site [email or web address] and access using universal [place] password for that web site. If you do not have the password, please call the hospital operator.  09/02/2021, 3:17 PM

## 2021-09-02 NOTE — TOC Progression Note (Addendum)
Transition of Care [facility]) - Progression Note    Patient Details  Name: Mario [name] MRN: [id number] Date of Birth: 08-17-1950  Transition of [facility]) CM/SW [place], LCSW Phone Number: 09/02/2021, 4:18 PM  Clinical Narrative:    Staffed case with [facility] Director and he suggested checking with [facility]. CSW requested they review patient. CSW requested Financial Counseling screen patient for Medicaid.    Expected Discharge Plan: Skilled Nursing Facility Barriers to Discharge: Insurance Authorization, SNF Pending bed offer  Expected Discharge Plan and Services Expected Discharge Plan: [place] In-house Referral: Clinical Social Work   Post Acute Care Choice: [place] Living arrangements for the past 2 months: Apartment                                       Social Determinants of Health (SDOH) Interventions    Readmission Risk Interventions    08/23/2021   11:48 AM 08/05/2021   11:02 AM 06/07/2021    4:15 PM  Readmission Risk Prevention Plan  Transportation Screening Complete Complete Complete  Medication Review Press photographer) Complete Complete Complete  PCP or Specialist appointment within 3-5 days of discharge Complete Complete Complete  HRI or Home Care Consult Complete Complete Complete  SW Recovery Care/Counseling Consult Complete Complete Complete  Palliative Care Screening Not Applicable Not Applicable Not [place] Not Applicable Not Applicable Not Applicable

## 2021-09-02 NOTE — Progress Notes (Signed)
Daily Progress Note   Patient Name: Mario [name]       Date: 09/02/2021 DOB: September 13, 1950  Age: 71 y.o. MRN#: [id number] Attending Physician: Jonetta Osgood, MD Primary Care Physician: [facility] Admit Date: 08/23/2021  Reason for Consultation/Follow-up: Establishing goals of care  Subjective: Symptoms stable, having BMs - declined bowel regimen this AM, continues with oxycontin and as needed oxy IR. Sleeping well.  Length of Stay: 10  Current Medications: Scheduled Meds:   acetaminophen  1,000 mg Oral TID   apixaban  5 mg Oral BID   arformoterol  15 mcg Nebulization BID   aspirin EC  81 mg Oral Daily   budesonide  0.5 mg Inhalation Q2000   buPROPion  300 mg Oral Daily   busPIRone  15 mg Oral Daily   cycloSPORINE  1 drop Both Eyes BID   diltiazem  180 mg Oral Daily   DULoxetine  60 mg Oral BID   gabapentin  300 mg Oral BID   lidocaine  2 patch Transdermal Q24H   lurasidone  40 mg Oral QPC supper   melatonin  6 mg Oral QHS   mycophenolate  1,500 mg Oral BID   oxcarbazepine  600 mg Oral Daily   oxcarbazepine  900 mg Oral QHS   oxyCODONE  20 mg Oral Q12H   polyethylene glycol  17 g Oral Daily   senna-docusate  2 tablet Oral BID   sodium chloride flush  3 mL Intravenous Q12H   tamsulosin  0.4 mg Oral Daily   traZODone  75 mg Oral QHS    Continuous Infusions:   PRN Meds: albuterol, guaiFENesin, ondansetron **OR** ondansetron (ZOFRAN) IV, oxyCODONE, oxymetazoline  Physical Exam          Vital Signs: BP (!) 142/76 (BP Location: Right Arm)   Pulse 96   Temp 97.9 F (36.6 C) (Oral)   Resp 20   Ht _0  (1.88 m)   Wt (!) 147 kg   SpO2 91%   BMI 41.61 kg/m  SpO2: SpO2: 91 % O2 Device: O2 Device: Nasal Cannula O2 Flow Rate: O2 Flow Rate (L/min): 4  L/min  Intake/output summary: No intake or output data in the 24 hours ending 09/02/21 1626 LBM: Last BM Date : 08/30/21 Baseline Weight: Weight: (!) 147 kg Most recent weight: Weight: (!) 147 kg       Palliative Assessment/Data: PPS 40%      Patient Active Problem List   Diagnosis Date Noted   Rib fractures 08/24/2021   Pleural effusion, left 08/20/2021   Metastatic carcinoma ([place]) 08/07/2021   Non-small cell lung cancer, left ([place]) 08/07/2021   Chronic respiratory failure with hypoxia ([place]) 08/07/2021   Rib pain 08/03/2021   Encounter for antineoplastic chemotherapy 07/20/2021   Leg paresthesia 06/17/2021   Sepsis ([place]) 06/17/2021   Obesity, Class III, BMI 40-49.9 (morbid obesity) ([place]) 06/17/2021   Pneumonia 06/17/2021   Hypertension    Allergic rhinitis    History of pulmonary embolism    Hyponatremia 05/20/2021   Mood disorder ([place]) 02/03/2021   Non-small cell cancer of left lung ([place]) 09/21/2020   Lung nodule 07/30/2020   Mass of left lung  Constipation    HCAP (healthcare-associated pneumonia) 04/03/2020   COPD with acute exacerbation ([place]) 01/05/2020   Vertigo 12/26/2019   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   Stage 3a chronic kidney disease ([place]) 12/26/2019   Abnormal CT of the chest 12/26/2019   COPD (chronic obstructive pulmonary disease) ([place])    Hyperglycemia 06/23/2019   OSA (obstructive sleep apnea) 06/23/2019   Major depressive disorder, recurrent episode ([place]) 10/12/2018   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   Cellulitis of both feet 07/16/2017   DM2 (diabetes mellitus, type 2) ([place]) 07/16/2017   HLA B27 (HLA B27 positive) 07/16/2017   Hyperkalemia    Hypoxia    Dyspnea 05/23/2014   COPD exacerbation ([place]) 05/23/2014   Malingering 01/21/2013   Depression 01/11/2013   Tobacco abuse 01/11/2013   Obesity, unspecified 01/11/2013   Hypertension associated with diabetes ([place]) 01/10/2013    Palliative Care  Assessment & Plan   HPI: 71 y.o. male  with past medical history of COPD, malignant non-small cell lung cancer, vasculitis, PE on Eliquis, hypertension, chronic pain, malignant pleural effusion with Pleurx, OSA, and multiple hospitalizations admitted on 08/23/2021 with increased shortness of breath.  Of note, patient just discharged 3 to 4 hours prior to readmission.  Patient with ongoing pleuritic left-sided chest pain likely related to history of left-sided rib fractures related to a fall and also metastatic non-small cell lung cancer.  Also has malignant pleural effusion and Pleurx catheter on left-side of chest.  PMT consulted to discuss goals of care.   Of note, patient with 8 admissions and 2 ED visits in past 6 months.  Assessment: Patient seems to be in better spirits today. Symptoms controlled. Telling me jokes during my visit.   We again reviewed his symptom management regimen. He understands and is agreeable. He tells me he declined bowel regimen this AM - we discussed need for bowel regimen while on pain medications. He understands and will take tomorrow if does not have a BM tonight or in AM.   He is planning to speak with [facility] team today about placement options - says his RN messaged them.   Recommendations/Plan: Symptoms controlled with current regimen:OxyContin 20 mg twice daily, Oxy IR every 4 hours as needed, scheduled Tylenol 1000 mg 3 times daily, lidocaine patch Continue bowel regimen TOC working on placement DNR code status, MOST completed and on chart/in vynca Daughter Bobbi as Media planner if patient unable  Code Status: DNR  Discharge Planning: [place] for rehab with Palliative care service follow-up  Care plan was discussed with patient   Thank you for allowing the Palliative Medicine Team to assist in the care of this patient.   *Please note that this is a verbal dictation therefore any spelling or grammatical errors are due to the "[place]" system interpretation.  Juel Burrow, DNP, [facility] Palliative Medicine Team Team Phone # [phone]  Pager [phone]

## 2021-09-03 NOTE — Progress Notes (Signed)
Pt requested that scheduled oxycotin 20 mg and PRN q4hr oxycodone be administered together. RN received clarification from Damian Leavell, RN  of pallative care [phone] that when medication times allow its okay to give together.

## 2021-09-03 NOTE — TOC Progression Note (Addendum)
Transition of Care [facility]) - Progression Note    Patient Details  Name: Mario [name] MRN: [id number] Date of Birth: 10-24-1950  Transition of Care [facility]) CM/SW [place], [place] Phone Number: 09/03/2021, 1:54 PM  Clinical Narrative:    12pm-CSW met with patient and made him aware of only available SNF bed offer at [facility] in [place]. Patient stated concern that it was far away and that he would have to pay to get back home after rehab. CSW contacted Debbie in admissions and she stated they have their own transportation that they can use to take patient home. CSW alerted patient and provided printed materials on [facility]. CSW encouraged him to consider going to rehab as he stated his roommate is going to a nursing facility in [place] and he has not friends or family to help him.  3pm-CSW checked in with patient. He would like to speak with Admissions at [facility] but was told Jackelyn Poling had stepped out of the office. CSW texted Jackelyn Poling asking her to reach out to patient [phone]). He stated he also wanted to make sure she was aware that his pain medicine can be given at the same time as one is every 12 hours and the other is every 4 hours. CSW assisted patient in getting a cup of ice water. CSW can start insurance process once updated therapy note is in with hopes that patient will agree to SNF.  Update: Per Debbie with [place], she has already spoken with patient for an hour.    Expected Discharge Plan: Skilled Nursing Facility Barriers to Discharge: Insurance Authorization, SNF Pending bed offer  Expected Discharge Plan and Services Expected Discharge Plan: [place] In-house Referral: Clinical Social Work   Post Acute Care Choice: [place] Living arrangements for the past 2 months: Apartment                                       Social Determinants of Health (SDOH) Interventions    Readmission Risk  Interventions    08/23/2021   11:48 AM 08/05/2021   11:02 AM 06/07/2021    4:15 PM  Readmission Risk Prevention Plan  Transportation Screening Complete Complete Complete  Medication Review Press photographer) Complete Complete Complete  PCP or Specialist appointment within 3-5 days of discharge Complete Complete Complete  HRI or Home Care Consult Complete Complete Complete  SW Recovery Care/Counseling Consult Complete Complete Complete  Palliative Care Screening Not Applicable Not Applicable Not [place] Not Applicable Not Applicable Not Applicable

## 2021-09-03 NOTE — Progress Notes (Signed)
PROGRESS NOTE        PATIENT DETAILS Name: Mario [name] Age: 71 y.o. Sex: male Date of Birth: 21-Nov-1950 Admit Date: 08/23/2021 Admitting Physician Vianne Bulls, MD RJJ:[facility]  Brief Summary: Patient is a 71 y.o.  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   Significant events: 6/10-6/13>> hospitalization for PNA, acute on chronic hypoxic 6/23-6/26>> hospitalization for dehydration/hyponatremia/AKI/COPD exacerbation-refused SNF-went home. 6/26>> back to the ED a few hours after discharge-with worsening shortness of breath-thought to have pneumonia and readmitted to the [facility] service  Significant studies: 6/26>> CTA chest: No PE, new acute left rib fracture, stable left seventh rib fracture, loculated pleural effusion has decreased in size-Pleurx catheter in place.  New left lower lobe airspace consolidation.  Significant microbiology data: 6/26>> blood culture: No growth  Procedures: 7/5>> left Pleurx catheter removed by IR.  Consults: PCCM, palliative care, psychiatry  Subjective: Comfortable-left pleuritic pain appears unchanged.  Awaiting SNF bed.  Objective: Vitals: Blood pressure 134/84, pulse 95, temperature 97.9 F (36.6 C), temperature source Oral, resp. rate 16, height 6\' 2"  (1.88 m), weight (!) 147 kg, SpO2 100 %.   Exam: Gen Exam:Alert awake-not in any distress HEENT:atraumatic, normocephalic Chest: B/L clear to auscultation anteriorly CVS:S1S2 regular Abdomen:soft non tender, non distended Extremities:no edema Neurology: Non focal Skin: no  rash  Pertinent Labs/Radiology:    Latest Ref Rng & Units 09/01/2021    1:41 AM 08/26/2021    1:41 AM 08/25/2021    2:25 AM  CBC  WBC 4.0 - 10.5 K/uL 9.7  10.1  9.2   Hemoglobin 13.0 - 17.0 g/dL 10.6  10.8  10.8   Hematocrit 39.0 - 52.0 % 32.3  34.2  34.0   Platelets 150 - 400 K/uL 394  435  449     Lab Results  Component Value Date   NA 133 (L) 09/01/2021   K 4.0 09/01/2021   CL 96 (L) 09/01/2021   CO2 26 09/01/2021      Assessment/Plan: PNA: Stable-afebrile-no leukocytosis-has completed a course of antibiotics.  Left-sided pleuritic chest pain: Due to rib fractures-underlying malignancy-continue supportive care.  CTA chest negative for PE-furthermore already on Eliquis.  Palliative care following and managing narcotic regimen.  Malignant left pleural effusion: Evaluated by IR-since Pleurx catheter not in place-removed on 7/5.  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 Julien Nordmann had spoken with the patient and given him options for hospice versus palliative chemotherapy-patient chose the latter-we will need outpatient follow-up with Dr. Earlie Server postdischarge.  Depression/anxiety with suicidal thoughts: Evaluated by psychiatry during the earlier part of this hospital stay-cleared for discharge-no need for inpatient psych treatment.  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  BPH: Continue Flomax  Morbid Obesity: Estimated body mass index is 41.61 kg/m as calculated from the following:   Height as of this  encounter: 6\' 2"  (1.88 m).   Weight as of this encounter: 147 kg.   Code status:   Code Status: DNR   DVT Prophylaxis: apixaban (ELIQUIS) tablet 5 mg    Family Communication: None at bedside   Disposition Plan: Status is: Inpatient Remains inpatient appropriate because: Awaiting SNF bed-medically stable for discharge.   Planned Discharge Destination:Skilled nursing facility    Diet: Diet Order             Diet regular Room service appropriate? Yes; Fluid consistency: Thin  Diet effective now                     Antimicrobial agents: Anti-infectives (From admission, onward)    Start     Dose/Rate Route Frequency Ordered Stop   08/27/21 1000  cefdinir (OMNICEF) capsule 300 mg        300 mg Oral Every 12 hours 08/26/21 1136 08/28/21 2152   08/26/21 0600  azithromycin (ZITHROMAX) tablet 500 mg        500 mg Oral Every 24 hours 08/25/21 0851 08/28/21 0535   08/24/21 0415  cefTRIAXone (ROCEPHIN) 2 g in sodium chloride 0.9 % 100 mL IVPB  Status:  Discontinued        2 g 200 mL/hr over 30 Minutes Intravenous Every 24 hours 08/24/21 0402 08/26/21 1135   08/24/21 0415  azithromycin (ZITHROMAX) 500 mg in sodium chloride 0.9 % 250 mL IVPB  Status:  Discontinued        500 mg 250 mL/hr over 60 Minutes Intravenous Every 24 hours 08/24/21 0402 08/25/21 0850   08/23/21 2345  ceFEPIme (MAXIPIME) 2 g in sodium chloride 0.9 % 100 mL IVPB        2 g 200 mL/hr over 30 Minutes Intravenous  Once 08/23/21 2332 08/24/21 0048   08/23/21 2330  vancomycin (VANCOREADY) IVPB 2000 mg/400 mL        2,000 mg 200 mL/hr over 120 Minutes Intravenous  Once 08/23/21 2332 08/24/21 0302        MEDICATIONS: Scheduled Meds:  acetaminophen  1,000 mg Oral TID   apixaban  5 mg Oral BID   arformoterol  15 mcg Nebulization BID   aspirin EC  81 mg Oral Daily   budesonide  0.5 mg Inhalation Q2000   buPROPion  300 mg Oral Daily   busPIRone  15 mg Oral Daily   cycloSPORINE  1 drop Both Eyes BID    diltiazem  180 mg Oral Daily   DULoxetine  60 mg Oral BID   gabapentin  300 mg Oral BID   lidocaine  2 patch Transdermal Q24H   lurasidone  40 mg Oral QPC supper   melatonin  6 mg Oral QHS   mycophenolate  1,500 mg Oral BID   oxcarbazepine  600 mg Oral Daily   oxcarbazepine  900 mg Oral QHS   oxyCODONE  20 mg Oral Q12H   polyethylene glycol  17 g Oral Daily   senna-docusate  2 tablet Oral BID   sodium chloride flush  3 mL Intravenous Q12H   tamsulosin  0.4 mg Oral Daily   traZODone  75 mg Oral QHS   Continuous Infusions: PRN Meds:.albuterol, guaiFENesin, ondansetron **OR** ondansetron (ZOFRAN) IV, oxyCODONE, oxymetazoline   I have personally reviewed following labs and imaging studies  LABORATORY DATA: CBC: Recent Labs  Lab 09/01/21 0141  WBC 9.7  HGB 10.6*  HCT 32.3*  MCV 86.6  PLT 394     Basic Metabolic Panel: Recent Labs  Lab 09/01/21 0141  NA 133*  K 4.0  CL 96*  CO2 26  GLUCOSE 108*  BUN 14  CREATININE 0.93  CALCIUM 8.7*     GFR: Estimated Creatinine Clearance: 114.6 mL/min (by C-G formula based on SCr of 0.93 mg/dL).  Liver Function Tests: Recent Labs  Lab 09/01/21 0141  AST 12*  ALT 13  ALKPHOS 70  BILITOT 0.1*  PROT 5.7*  ALBUMIN 3.0*    No results for input(s): "LIPASE", "AMYLASE" in the last 168 hours. No results for input(s): "AMMONIA" in the last 168 hours.  Coagulation Profile: No results for input(s): "INR", "PROTIME" in the last 168 hours.  Cardiac Enzymes: No results for input(s): "CKTOTAL", "CKMB", "CKMBINDEX", "TROPONINI" in the last 168 hours.  BNP (last 3 results) No results for input(s): "PROBNP" in the last 8760 hours.  Lipid Profile: No results for input(s): "CHOL", "HDL", "LDLCALC", "TRIG", "CHOLHDL", "LDLDIRECT" in the last 72 hours.  Thyroid Function Tests: No results for input(s): "TSH", "T4TOTAL", "FREET4", "T3FREE", "THYROIDAB" in the last 72 hours.  Anemia Panel: No results for input(s): "VITAMINB12",  "FOLATE", "FERRITIN", "TIBC", "IRON", "RETICCTPCT" in the last 72 hours.  Urine analysis:    Component Value Date/Time   COLORURINE YELLOW 08/21/2021 0041   APPEARANCEUR HAZY (A) 08/21/2021 0041   LABSPEC 1.017 08/21/2021 0041   PHURINE 5.0 08/21/2021 0041   GLUCOSEU NEGATIVE 08/21/2021 0041   HGBUR NEGATIVE 08/21/2021 0041   BILIRUBINUR NEGATIVE 08/21/2021 0041   KETONESUR NEGATIVE 08/21/2021 0041   PROTEINUR NEGATIVE 08/21/2021 0041   UROBILINOGEN 1.0 01/12/2013 0024   NITRITE NEGATIVE 08/21/2021 0041   LEUKOCYTESUR NEGATIVE 08/21/2021 0041    Sepsis Labs: Lactic Acid, Venous    Component Value Date/Time   LATICACIDVEN 1.1 08/24/2021 0416    MICROBIOLOGY: No results found for this or any previous visit (from the past 240 hour(s)).   RADIOLOGY STUDIES/RESULTS: DG Chest 1 View  Result Date: 09/01/2021 CLINICAL DATA:  Status post left PleurX catheter removal. EXAM: CHEST  1 VIEW COMPARISON:  Chest x-ray dated August 28, 2021. FINDINGS: Interval removal of the left-sided PleurX catheter. No pneumothorax. Unchanged small loculated left pleural effusion and left base bulla with air-fluid level. Unchanged left basilar airspace disease and/or atelectasis. The lungs remain hyperinflated with chronically coarsened interstitial markings. Stable cardiomediastinal silhouette with normal heart size. IMPRESSION: 1. Interval left PleurX catheter removal. No pneumothorax. 2. Otherwise unchanged exam. Electronically Signed   By: Titus Dubin M.D.   On: 09/01/2021 15:50     LOS: 11 days   Oren Binet, MD  [facility]    To contact the attending provider between 7A-7P or the covering provider during after hours 7P-7A, please log into the web site [email or web address] and access using universal [place] password for that web site. If you do not have the password, please call the hospital operator.  09/03/2021, 11:58 AM

## 2021-09-04 NOTE — Progress Notes (Signed)
PROGRESS NOTE        PATIENT DETAILS Name: Mario [name] Age: 71 y.o. Sex: male Date of Birth: 08/12/1950 Admit Date: 08/23/2021 Admitting Physician Vianne Bulls, MD [id number], [place]  Brief Summary: Patient is a 71 y.o.  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   Significant events: 6/10-6/13>> hospitalization for PNA, acute on chronic hypoxic 6/23-6/26>> hospitalization for dehydration/hyponatremia/AKI/COPD exacerbation-refused SNF-went home. 6/26>> back to the ED a few hours after discharge-with worsening shortness of breath-thought to have pneumonia and readmitted to the [facility] service  Significant studies: 6/26>> CTA chest: No PE, new acute left rib fracture, stable left seventh rib fracture, loculated pleural effusion has decreased in size-Pleurx catheter in place.  New left lower lobe airspace consolidation.  Significant microbiology data: 6/26>> blood culture: No growth  Procedures: 7/5>> left Pleurx catheter removed by IR.  Consults: PCCM, palliative care, psychiatry  Subjective: Mild left-sided chest pain continuous-awaiting SNF-medically stable for discharge.  Objective: Vitals: Blood pressure (!) 109/49, pulse 81, temperature 98.1 F (36.7 C), temperature source Oral, resp. rate 17, height 6\' 2"  (1.88 m), weight (!) 147 kg, SpO2 99 %.   Exam: Gen Exam:Alert awake-not in any distress HEENT:atraumatic, normocephalic Chest: B/L clear to auscultation anteriorly CVS:S1S2 regular Abdomen:soft non tender, non distended Extremities:no edema Neurology: Non  focal Skin: no rash   Pertinent Labs/Radiology:    Latest Ref Rng & Units 09/01/2021    1:41 AM 08/26/2021    1:41 AM 08/25/2021    2:25 AM  CBC  WBC 4.0 - 10.5 K/uL 9.7  10.1  9.2   Hemoglobin 13.0 - 17.0 g/dL 10.6  10.8  10.8   Hematocrit 39.0 - 52.0 % 32.3  34.2  34.0   Platelets 150 - 400 K/uL 394  435  449     Lab Results  Component Value Date   NA 133 (L) 09/01/2021   K 4.0 09/01/2021   CL 96 (L) 09/01/2021   CO2 26 09/01/2021     Assessment/Plan: PNA: Stable-afebrile-no leukocytosis-has completed a course of antibiotics.  Left-sided pleuritic chest pain: Due to rib fractures-underlying malignancy-continue supportive care.  CTA chest negative for PE-furthermore already on Eliquis.  Palliative care following and managing narcotic regimen.  Malignant left pleural effusion: Evaluated by IR-since Pleurx catheter not in place-removed on 7/5.  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 Julien Nordmann had spoken with the patient and given him options for hospice versus palliative chemotherapy-patient chose the latter-we will need outpatient follow-up with Dr. Earlie Server postdischarge.  Depression/anxiety with suicidal thoughts: Evaluated by psychiatry during the earlier part of this hospital stay-cleared for discharge-no need for inpatient psych treatment.  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  BPH: Continue  Flomax  Morbid Obesity: Estimated body mass index is 41.61 kg/m as calculated from the following:   Height as  of this encounter: 6\' 2"  (1.88 m).   Weight as of this encounter: 147 kg.   Code status:   Code Status: DNR   DVT Prophylaxis: apixaban (ELIQUIS) tablet 5 mg    Family Communication: None at bedside   Disposition Plan: Status is: Inpatient Remains inpatient appropriate because: Awaiting SNF bed-medically stable for discharge.   Planned Discharge Destination:Skilled nursing facility    Diet: Diet Order             Diet regular Room service appropriate? Yes; Fluid consistency: Thin  Diet effective now                     Antimicrobial agents: Anti-infectives (From admission, onward)    Start     Dose/Rate Route Frequency Ordered Stop   08/27/21 1000  cefdinir (OMNICEF) capsule 300 mg        300 mg Oral Every 12 hours 08/26/21 1136 08/28/21 2152   08/26/21 0600  azithromycin (ZITHROMAX) tablet 500 mg        500 mg Oral Every 24 hours 08/25/21 0851 08/28/21 0535   08/24/21 0415  cefTRIAXone (ROCEPHIN) 2 g in sodium chloride 0.9 % 100 mL IVPB  Status:  Discontinued        2 g 200 mL/hr over 30 Minutes Intravenous Every 24 hours 08/24/21 0402 08/26/21 1135   08/24/21 0415  azithromycin (ZITHROMAX) 500 mg in sodium chloride 0.9 % 250 mL IVPB  Status:  Discontinued        500 mg 250 mL/hr over 60 Minutes Intravenous Every 24 hours 08/24/21 0402 08/25/21 0850   08/23/21 2345  ceFEPIme (MAXIPIME) 2 g in sodium chloride 0.9 % 100 mL IVPB        2 g 200 mL/hr over 30 Minutes Intravenous  Once 08/23/21 2332 08/24/21 0048   08/23/21 2330  vancomycin (VANCOREADY) IVPB 2000 mg/400 mL        2,000 mg 200 mL/hr over 120 Minutes Intravenous  Once 08/23/21 2332 08/24/21 0302        MEDICATIONS: Scheduled Meds:  acetaminophen  1,000 mg Oral TID   apixaban  5 mg Oral BID   arformoterol  15 mcg Nebulization BID   aspirin EC  81 mg Oral Daily   budesonide  0.5 mg Inhalation Q2000   buPROPion  300 mg Oral Daily   busPIRone  15 mg Oral Daily   cycloSPORINE  1 drop Both Eyes  BID   diltiazem  180 mg Oral Daily   DULoxetine  60 mg Oral BID   gabapentin  300 mg Oral BID   lidocaine  2 patch Transdermal Q24H   lurasidone  40 mg Oral QPC supper   melatonin  6 mg Oral QHS   mycophenolate  1,500 mg Oral BID   oxcarbazepine  600 mg Oral Daily   oxcarbazepine  900 mg Oral QHS   oxyCODONE  20 mg Oral Q12H   polyethylene glycol  17 g Oral Daily   senna-docusate  2 tablet Oral BID   sodium chloride flush  3 mL Intravenous Q12H   tamsulosin  0.4 mg Oral Daily   traZODone  75 mg Oral QHS   Continuous Infusions: PRN Meds:.albuterol, guaiFENesin, ondansetron **OR** ondansetron (ZOFRAN) IV, oxyCODONE   I have personally reviewed following labs and imaging studies  LABORATORY DATA: CBC: Recent Labs  Lab 09/01/21  0141  WBC 9.7  HGB 10.6*  HCT 32.3*  MCV 86.6  PLT 394     Basic Metabolic Panel: Recent Labs  Lab 09/01/21 0141  NA 133*  K 4.0  CL 96*  CO2 26  GLUCOSE 108*  BUN 14  CREATININE 0.93  CALCIUM 8.7*     GFR: Estimated Creatinine Clearance: 114.6 mL/min (by C-G formula based on SCr of 0.93 mg/dL).  Liver Function Tests: Recent Labs  Lab 09/01/21 0141  AST 12*  ALT 13  ALKPHOS 70  BILITOT 0.1*  PROT 5.7*  ALBUMIN 3.0*    No results for input(s): "LIPASE", "AMYLASE" in the last 168 hours. No results for input(s): "AMMONIA" in the last 168 hours.  Coagulation Profile: No results for input(s): "INR", "PROTIME" in the last 168 hours.  Cardiac Enzymes: No results for input(s): "CKTOTAL", "CKMB", "CKMBINDEX", "TROPONINI" in the last 168 hours.  BNP (last 3 results) No results for input(s): "PROBNP" in the last 8760 hours.  Lipid Profile: No results for input(s): "CHOL", "HDL", "LDLCALC", "TRIG", "CHOLHDL", "LDLDIRECT" in the last 72 hours.  Thyroid Function Tests: No results for input(s): "TSH", "T4TOTAL", "FREET4", "T3FREE", "THYROIDAB" in the last 72 hours.  Anemia Panel: No results for input(s): "VITAMINB12", "FOLATE",  "FERRITIN", "TIBC", "IRON", "RETICCTPCT" in the last 72 hours.  Urine analysis:    Component Value Date/Time   COLORURINE YELLOW 08/21/2021 0041   APPEARANCEUR HAZY (A) 08/21/2021 0041   LABSPEC 1.017 08/21/2021 0041   PHURINE 5.0 08/21/2021 0041   GLUCOSEU NEGATIVE 08/21/2021 0041   HGBUR NEGATIVE 08/21/2021 0041   BILIRUBINUR NEGATIVE 08/21/2021 0041   KETONESUR NEGATIVE 08/21/2021 0041   PROTEINUR NEGATIVE 08/21/2021 0041   UROBILINOGEN 1.0 01/12/2013 0024   NITRITE NEGATIVE 08/21/2021 0041   LEUKOCYTESUR NEGATIVE 08/21/2021 0041    Sepsis Labs: Lactic Acid, Venous    Component Value Date/Time   LATICACIDVEN 1.1 08/24/2021 0416    MICROBIOLOGY: No results found for this or any previous visit (from the past 240 hour(s)).   RADIOLOGY STUDIES/RESULTS: No results found.   LOS: 12 days   Oren Binet, MD  [facility]    To contact the attending provider between 7A-7P or the covering provider during after hours 7P-7A, please log into the web site [email or web address] and access using universal [place] password for that web site. If you do not have the password, please call the hospital operator.  09/04/2021, 11:15 AM

## 2021-09-05 NOTE — Plan of Care (Signed)
  Problem: Activity: Goal: Ability to tolerate increased activity will improve Outcome: Progressing   Problem: Clinical Measurements: Goal: Ability to maintain a body temperature in the normal range will improve Outcome: Progressing   Problem: Respiratory: Goal: Ability to maintain adequate ventilation will improve Outcome: Progressing Goal: Ability to maintain a clear airway will improve Outcome: Progressing   Problem: Education: Goal: Knowledge of General Education information will improve Description: Including pain rating scale, medication(s)/side effects and non-pharmacologic comfort measures Outcome: Progressing   Problem: Health Behavior/Discharge Planning: Goal: Ability to manage health-related needs will improve Outcome: Progressing   Problem: Clinical Measurements: Goal: Ability to maintain clinical measurements within normal limits will improve Outcome: Progressing Goal: Will remain free from infection Outcome: Progressing Goal: Diagnostic test results will improve Outcome: Progressing Goal: Respiratory complications will improve Outcome: Progressing Goal: Cardiovascular complication will be avoided Outcome: Progressing   Problem: Activity: Goal: Risk for activity intolerance will decrease Outcome: Progressing   Problem: Nutrition: Goal: Adequate nutrition will be maintained Outcome: Progressing   Problem: Coping: Goal: Level of anxiety will decrease Outcome: Progressing   Problem: Elimination: Goal: Will not experience complications related to bowel motility Outcome: Progressing Goal: Will not experience complications related to urinary retention Outcome: Progressing   Problem: Safety: Goal: Ability to remain free from injury will improve Outcome: Progressing   Problem: Skin Integrity: Goal: Risk for impaired skin integrity will decrease Outcome: Progressing   Problem: Pain Managment: Goal: General experience of comfort will improve Outcome:  Not Progressing

## 2021-09-05 NOTE — Progress Notes (Signed)
PROGRESS NOTE        PATIENT DETAILS Name: Mario [name] Age: 71 y.o. Sex: male Date of Birth: September 03, 1950 Admit Date: 08/23/2021 Admitting Physician Vianne Bulls, MD [id number], [place]  Brief Summary: Patient is a 71 y.o.  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   Significant events: 6/10-6/13>> hospitalization for PNA, acute on chronic hypoxic 6/23-6/26>> hospitalization for dehydration/hyponatremia/AKI/COPD exacerbation-refused SNF-went home. 6/26>> back to the ED a few hours after discharge-with worsening shortness of breath-thought to have pneumonia and readmitted to the [facility] service  Significant studies: 6/26>> CTA chest: No PE, new acute left rib fracture, stable left seventh rib fracture, loculated pleural effusion has decreased in size-Pleurx catheter in place.  New left lower lobe airspace consolidation.  Significant microbiology data: 6/26>> blood culture: No growth  Procedures: 7/5>> left Pleurx catheter removed by IR.  Consults: PCCM, palliative care, psychiatry  Subjective: Mild pleuritic left-sided chest pain continues-he appears comfortable.  No major events overnight-awaiting insurance authorization for SNF.  Stable for discharge.  Objective: Vitals: Blood pressure 137/77, pulse 91, temperature (!) 97.4 F (36.3 C), temperature source Axillary, resp. rate 17, height 6\' 2"  (1.88 m), weight (!) 147 kg, SpO2 98 %.   Exam: Gen Exam:Alert awake-not in any distress HEENT:atraumatic, normocephalic Chest: B/L clear to auscultation anteriorly CVS:S1S2  regular Abdomen:soft non tender, non distended Extremities:no edema Neurology: Non focal Skin: no rash   Pertinent Labs/Radiology:    Latest Ref Rng & Units 09/01/2021    1:41 AM 08/26/2021    1:41 AM 08/25/2021    2:25 AM  CBC  WBC 4.0 - 10.5 K/uL 9.7  10.1  9.2   Hemoglobin 13.0 - 17.0 g/dL 10.6  10.8  10.8   Hematocrit 39.0 - 52.0 % 32.3  34.2  34.0   Platelets 150 - 400 K/uL 394  435  449     Lab Results  Component Value Date   NA 133 (L) 09/01/2021   K 4.0 09/01/2021   CL 96 (L) 09/01/2021   CO2 26 09/01/2021     Assessment/Plan: PNA: Stable-afebrile-no leukocytosis-has completed a course of antibiotics.  Left-sided pleuritic chest pain: Due to rib fractures-underlying malignancy-continue supportive care.  CTA chest negative for PE-furthermore already on Eliquis.  Palliative care following and managing narcotic regimen.  Malignant left pleural effusion: Evaluated by IR-since Pleurx catheter not in place-removed on 7/5.  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 Julien Nordmann had spoken with the patient and given him options for hospice versus palliative chemotherapy-patient chose the latter-we will need outpatient follow-up with Dr. Earlie Server postdischarge.  Depression/anxiety with suicidal thoughts: Evaluated by psychiatry during the earlier part of this hospital stay-cleared for discharge-no need for inpatient psych treatment.  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  BPH: Continue Flomax  Morbid  Obesity: Estimated body mass index is 41.61 kg/m as calculated from the following:   Height as of this encounter: 6\' 2"  (1.88 m).   Weight as of this encounter: 147 kg.   Code status:   Code Status: DNR   DVT Prophylaxis: apixaban (ELIQUIS) tablet 5 mg    Family Communication: None at bedside   Disposition Plan: Status is: Inpatient Remains inpatient appropriate because: Awaiting SNF bed-medically stable for discharge.   Planned Discharge Destination:Skilled nursing facility    Diet: Diet Order             Diet regular Room service appropriate? Yes; Fluid consistency: Thin  Diet effective now                     Antimicrobial agents: Anti-infectives (From admission, onward)    Start     Dose/Rate Route Frequency Ordered Stop   08/27/21 1000  cefdinir (OMNICEF) capsule 300 mg        300 mg Oral Every 12 hours 08/26/21 1136 08/28/21 2152   08/26/21 0600  azithromycin (ZITHROMAX) tablet 500 mg        500 mg Oral Every 24 hours 08/25/21 0851 08/28/21 0535   08/24/21 0415  cefTRIAXone (ROCEPHIN) 2 g in sodium chloride 0.9 % 100 mL IVPB  Status:  Discontinued        2 g 200 mL/hr over 30 Minutes Intravenous Every 24 hours 08/24/21 0402 08/26/21 1135   08/24/21 0415  azithromycin (ZITHROMAX) 500 mg in sodium chloride 0.9 % 250 mL IVPB  Status:  Discontinued        500 mg 250 mL/hr over 60 Minutes Intravenous Every 24 hours 08/24/21 0402 08/25/21 0850   08/23/21 2345  ceFEPIme (MAXIPIME) 2 g in sodium chloride 0.9 % 100 mL IVPB        2 g 200 mL/hr over 30 Minutes Intravenous  Once 08/23/21 2332 08/24/21 0048   08/23/21 2330  vancomycin (VANCOREADY) IVPB 2000 mg/400 mL        2,000 mg 200 mL/hr over 120 Minutes Intravenous  Once 08/23/21 2332 08/24/21 0302        MEDICATIONS: Scheduled Meds:  acetaminophen  1,000 mg Oral TID   apixaban  5 mg Oral BID   arformoterol  15 mcg Nebulization BID   aspirin EC  81 mg Oral Daily   budesonide  0.5 mg Inhalation Q2000    buPROPion  300 mg Oral Daily   busPIRone  15 mg Oral Daily   cycloSPORINE  1 drop Both Eyes BID   diltiazem  180 mg Oral Daily   DULoxetine  60 mg Oral BID   gabapentin  300 mg Oral BID   lidocaine  2 patch Transdermal Q24H   lurasidone  40 mg Oral QPC supper   melatonin  6 mg Oral QHS   mycophenolate  1,500 mg Oral BID   oxcarbazepine  600 mg Oral Daily   oxcarbazepine  900 mg Oral QHS   oxyCODONE  20 mg Oral Q12H   polyethylene glycol  17 g Oral Daily   senna-docusate  2 tablet Oral BID   sodium chloride flush  3 mL Intravenous Q12H   tamsulosin  0.4 mg Oral Daily   traZODone  75 mg Oral QHS   Continuous Infusions: PRN Meds:.albuterol, guaiFENesin, ondansetron **OR** ondansetron (ZOFRAN) IV, oxyCODONE   I have personally reviewed following labs  and imaging studies  LABORATORY DATA: CBC: Recent Labs  Lab 09/01/21 0141  WBC 9.7  HGB 10.6*  HCT 32.3*  MCV 86.6  PLT 394     Basic Metabolic Panel: Recent Labs  Lab 09/01/21 0141  NA 133*  K 4.0  CL 96*  CO2 26  GLUCOSE 108*  BUN 14  CREATININE 0.93  CALCIUM 8.7*     GFR: Estimated Creatinine Clearance: 114.6 mL/min (by C-G formula based on SCr of 0.93 mg/dL).  Liver Function Tests: Recent Labs  Lab 09/01/21 0141  AST 12*  ALT 13  ALKPHOS 70  BILITOT 0.1*  PROT 5.7*  ALBUMIN 3.0*    No results for input(s): "LIPASE", "AMYLASE" in the last 168 hours. No results for input(s): "AMMONIA" in the last 168 hours.  Coagulation Profile: No results for input(s): "INR", "PROTIME" in the last 168 hours.  Cardiac Enzymes: No results for input(s): "CKTOTAL", "CKMB", "CKMBINDEX", "TROPONINI" in the last 168 hours.  BNP (last 3 results) No results for input(s): "PROBNP" in the last 8760 hours.  Lipid Profile: No results for input(s): "CHOL", "HDL", "LDLCALC", "TRIG", "CHOLHDL", "LDLDIRECT" in the last 72 hours.  Thyroid Function Tests: No results for input(s): "TSH", "T4TOTAL", "FREET4", "T3FREE",  "THYROIDAB" in the last 72 hours.  Anemia Panel: No results for input(s): "VITAMINB12", "FOLATE", "FERRITIN", "TIBC", "IRON", "RETICCTPCT" in the last 72 hours.  Urine analysis:    Component Value Date/Time   COLORURINE YELLOW 08/21/2021 0041   APPEARANCEUR HAZY (A) 08/21/2021 0041   LABSPEC 1.017 08/21/2021 0041   PHURINE 5.0 08/21/2021 0041   GLUCOSEU NEGATIVE 08/21/2021 0041   HGBUR NEGATIVE 08/21/2021 0041   BILIRUBINUR NEGATIVE 08/21/2021 0041   KETONESUR NEGATIVE 08/21/2021 0041   PROTEINUR NEGATIVE 08/21/2021 0041   UROBILINOGEN 1.0 01/12/2013 0024   NITRITE NEGATIVE 08/21/2021 0041   LEUKOCYTESUR NEGATIVE 08/21/2021 0041    Sepsis Labs: Lactic Acid, Venous    Component Value Date/Time   LATICACIDVEN 1.1 08/24/2021 0416    MICROBIOLOGY: No results found for this or any previous visit (from the past 240 hour(s)).   RADIOLOGY STUDIES/RESULTS: No results found.   LOS: 13 days   Oren Binet, MD  [facility]    To contact the attending provider between 7A-7P or the covering provider during after hours 7P-7A, please log into the web site [email or web address] and access using universal [place] password for that web site. If you do not have the password, please call the hospital operator.  09/05/2021, 1:50 PM

## 2021-09-06 MED ORDER — APIXABAN 5 MG PO TABS: 5.0000 mg | ORAL_TABLET | Freq: Two times a day (BID) | ORAL | Status: DC

## 2021-09-06 MED ORDER — OXYCODONE HCL 5 MG PO TABS
5.0000 mg | ORAL_TABLET | ORAL | 0 refills | Status: DC | PRN
Start: 2021-09-06 — End: 2021-09-13

## 2021-09-06 MED ORDER — OXYCODONE HCL ER 20 MG PO T12A: 20.0000 mg | EXTENDED_RELEASE_TABLET | Freq: Two times a day (BID) | ORAL | 0 refills | Status: DC

## 2021-09-06 MED ORDER — OXYMETAZOLINE HCL 0.05 % NA SOLN
2.0000 | Freq: Two times a day (BID) | NASAL | Status: DC
Start: 2021-09-06 — End: 2021-09-07
  Administered 2021-09-06 (×2): 2 via NASAL
  Filled 2021-09-06: qty 30

## 2021-09-06 MED ORDER — OXYMETAZOLINE HCL 0.05 % NA SOLN
2.0000 | Freq: Two times a day (BID) | NASAL | 0 refills | Status: AC
Start: 2021-09-06 — End: 2021-09-09

## 2021-09-06 MED ORDER — SALINE SPRAY 0.65 % NA SOLN: 1.0000 | NASAL | Status: DC | PRN

## 2021-09-06 MED ORDER — LIDOCAINE 5 % EX PTCH
2.0000 | MEDICATED_PATCH | CUTANEOUS | 0 refills | Status: DC
Start: 2021-09-06 — End: 2021-09-26

## 2021-09-06 NOTE — Progress Notes (Signed)
Informed by RN-small nosebleed-is on anticoagulation-starting Afrin-have asked RN to apply pressure.  Hold planned discharge today-likely home tomorrow morning.  Hold evening dose of Eliquis.

## 2021-09-06 NOTE — Progress Notes (Signed)
Physical Therapy Treatment Patient Details Name: Mario [name] MRN: [id number] DOB: [birth year] Today's Date: 09/06/2021   History of Present Illness Pt is a 71 y/o male admitted 6/26 after being discharged the same day, secondary to worsening L chest pain and continued PNA. PMH includes COPD, pleural effusion s/p pleurx catheter, lung cancer, HTN, and PE.    PT Comments    Pt remains limited overall with mobility secondary to pain and fatigue. He was able to complete transfers with supervision and ambulate a short distance in the hallway with use of straight cane and min guard. He reported that he could not tolerate further mobility at this time secondary to lower back pain. PT recommended that at next session pt trial the RW for increased support with mobility. He was agreeable to this plan. Pt would continue to benefit from skilled physical therapy services at this time while admitted and after d/c to address the below listed limitations in order to improve overall safety and independence with functional mobility.   Recommendations for follow up therapy are one component of a multi-disciplinary discharge planning process, led by the attending physician.  Recommendations may be updated based on patient status, additional functional criteria and insurance authorization.  Follow Up Recommendations  Skilled nursing-short term rehab (<3 hours/day) Can patient physically be transported by private vehicle: Yes   Assistance Recommended at Discharge Frequent or constant Supervision/Assistance  Patient can return home with the following A little help with walking and/or transfers;A little help with bathing/dressing/bathroom;Assistance with cooking/housework;Assist for transportation;Help with stairs or ramp for entrance   Equipment Recommendations  None recommended by PT    Recommendations for Other Services       Precautions / Restrictions Precautions Precautions: Fall Precaution Comments:  monitor O2, pt reports recent L rib fxs and pleurx catheter Restrictions Weight Bearing Restrictions: No     Mobility  Bed Mobility               General bed mobility comments: pt sitting upright at EOB upon PT arrival    Transfers Overall transfer level: Needs assistance Equipment used: Straight cane Transfers: Sit to/from Stand Sit to Stand: Supervision           General transfer comment: for safety    Ambulation/Gait Ambulation/Gait assistance: Min guard Gait Distance (Feet): 75 Feet Assistive device: Straight cane Gait Pattern/deviations: Step-through pattern, Decreased stride length Gait velocity: Decreased     General Gait Details: pt with mild instability with higher level challenges to balance (quick stop with 180 degree turn and navigating around obstacles) but no overt LOB or need for physical assistance; limited secondary to fatigue   Stairs             Wheelchair Mobility    Modified Rankin (Stroke Patients Only)       Balance Overall balance assessment: Needs assistance Sitting-balance support: Feet supported Sitting balance-Leahy Scale: Good     Standing balance support: Single extremity supported Standing balance-Leahy Scale: Poor                              Cognition Arousal/Alertness: Awake/alert Behavior During Therapy: Flat affect Overall Cognitive Status: Within Functional Limits for tasks assessed                                          Exercises  General Comments        Pertinent Vitals/Pain Pain Assessment Pain Assessment: Faces Faces Pain Scale: Hurts little more Pain Location: lower back Pain Descriptors / Indicators: Guarding Pain Intervention(s): Monitored during session, Repositioned    Home Living                          Prior Function            PT Goals (current goals can now be found in the care plan section) Acute Rehab PT Goals PT Goal  Formulation: With patient Time For Goal Achievement: 09/07/21 Potential to Achieve Goals: Good Progress towards PT goals: Progressing toward goals    Frequency    Min 2X/week      PT Plan Current plan remains appropriate    Co-evaluation              AM-PAC PT "6 Clicks" Mobility   Outcome Measure  Help needed turning from your back to your side while in a flat bed without using bedrails?: A Little Help needed moving from lying on your back to sitting on the side of a flat bed without using bedrails?: A Little Help needed moving to and from a bed to a chair (including a wheelchair)?: A Little Help needed standing up from a chair using your arms (e.g., wheelchair or bedside chair)?: A Little Help needed to walk in hospital room?: A Little Help needed climbing 3-5 steps with a railing? : A Lot 6 Click Score: 17    End of Session Equipment Utilized During Treatment: Oxygen (3L) Activity Tolerance: Patient limited by fatigue;Patient limited by pain Patient left: in bed;with call bell/phone within reach;Other (comment) (sitting upright at EOB, refusing to sit in recliner) Nurse Communication: Mobility status PT Visit Diagnosis: Unsteadiness on feet (R26.81);Muscle weakness (generalized) (M62.81);History of falling (Z91.81)     Time: 1062-6948 PT Time Calculation (min) (ACUTE ONLY): 16 min  Charges:  $Gait Training: 8-22 mins                     Anastasio Champion, DPT  Acute Rehabilitation Services Office [place] 09/06/2021, 9:15 AM

## 2021-09-06 NOTE — TOC Transition Note (Addendum)
Transition of Care [facility]) - CM/SW Discharge Note   Patient Details  Name: Mario [name] MRN: [id number] Date of Birth: [birth year]  [facility] CM/SW Contact:  Coralee Pesa, [place] Phone Number: 09/06/2021, 1:25 PM   Clinical Narrative:    MD holding discharge until tomorrow. TOC will continue to follow.   Pt to be transported to [facility] via [place]. Nurse to call report to [phone]. A3 rm 1   Final next level of care: Skilled Nursing Facility Barriers to Discharge: Barriers Resolved   Patient Goals and CMS Choice Patient states their goals for this hospitalization and ongoing recovery are:: Rehab CMS Medicare.gov Compare Post Acute Care list provided to:: Patient Choice offered to / list presented to : Patient  Discharge Placement              Patient chooses bed at:  [facility]) Patient to be transferred to facility by: [place] Name of family member notified: Patient Patient and family notified of of transfer: 09/06/21  Discharge Plan and Services In-house Referral: Clinical Social Work   Post Acute Care Choice: [facility]                               Social Determinants of Health ([place]) Interventions     Readmission Risk Interventions    08/23/2021   11:48 AM 08/05/2021   11:02 AM 06/07/2021    4:15 PM  Readmission Risk Prevention Plan  Transportation Screening Complete Complete Complete  Medication Review Press photographer) Complete Complete Complete  PCP or Specialist appointment within 3-5 days of discharge Complete Complete Complete  HRI or Home Care Consult Complete Complete Complete  SW Recovery Care/Counseling Consult Complete Complete Complete  Palliative Care Screening Not Applicable Not Applicable Not [place] Not Applicable Not Applicable Not Applicable

## 2021-09-06 NOTE — Discharge Summary (Addendum)
PATIENT DETAILS Name: Mario [name] Age: 71 y.o. Sex: male Date of Birth: May 17, 1950 MRN: [id number]. Admitting Physician: Vianne Bulls, MD GMW:NUUVOZ, [place]  Admit Date: 08/23/2021 Discharge date: 09/07/2021  Recommendations for Outpatient Follow-up:  Follow up with PCP in 1-2 weeks Please obtain CMP/CBC in one week Please ensure follow-up with palliative care, oncology  Admitted From:  Home  Disposition: Home   Discharge Condition: fair  CODE STATUS:   Code Status: DNR   Diet recommendation:  Diet Order             Diet - low sodium heart healthy           Diet regular Room service appropriate? Yes; Fluid consistency: Thin  Diet effective now                    Brief Summary: Patient is a 71 y.o.  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    Significant events: 6/10-6/13>> hospitalization for PNA, acute on chronic hypoxic 6/23-6/26>> hospitalization for dehydration/hyponatremia/AKI/COPD exacerbation-refused SNF-went home. 6/26>> back to the ED a few hours after discharge-with worsening shortness of breath-thought to have pneumonia and readmitted to the [facility] service   Significant studies: 6/26>> CTA chest: No PE, new acute left rib fracture, stable left seventh rib fracture, loculated pleural effusion has decreased in size-Pleurx catheter in place.  New left lower lobe airspace consolidation.   Significant microbiology data: 6/26>> blood culture: No growth   Procedures: 7/5>> left Pleurx catheter removed by IR.   Consults: PCCM, palliative care, psychiatry  Brief Hospital  Course: PNA: Stable-afebrile-no leukocytosis-has completed a course of antibiotics.   Left-sided pleuritic chest pain: Due to rib fractures-underlying malignancy-continue supportive care.  CTA chest negative for PE-furthermore already on Eliquis.  Palliative care followed closely.   Malignant left pleural effusion: Evaluated by IR-since Pleurx catheter not in place-removed on 7/5.   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 Julien Nordmann had spoken with the patient and given him options for hospice versus palliative chemotherapy-patient chose the latter-we will need outpatient follow-up with Dr. Earlie Server postdischarge.   Depression/anxiety with suicidal thoughts: Evaluated by psychiatry during the earlier part of this hospital stay-cleared for discharge-no need for inpatient psych treatment.  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   Chronic pain: Likely related to malignancy-palliative care following-on OxyContin, scheduled Tylenol-and oxycodone for breakthrough pain.   BPH: Continue Flomax  Mild epistaxis: Occurred on 7/10-discharge held-anticoagulation held-managed with supportive care-epistaxis resolved in a couple of hours-no further epistaxis overnight-resume Eliquis-okay to DC to SNF today.   Morbid Obesity: Estimated body mass index is 41.61 kg/m as calculated from the following:   Height as of this encounter: 6\' 2"  (1.88 m).   Weight as of this encounter: 147 kg.   Discharge Diagnoses:  Principal Problem:   Pneumonia Active Problems:   Major depressive disorder, recurrent  episode ([place])   OSA (obstructive  sleep apnea)   COPD (chronic obstructive pulmonary disease) (HCC)   Non-small cell cancer of left lung (HCC)   Hypertension   History of pulmonary embolism   Metastatic carcinoma (HCC)   Chronic respiratory failure with hypoxia (HCC)   Pleural effusion, left   Rib fractures   Discharge Instructions:  Activity:  As tolerated with Full fall precautions use walker/cane & assistance as needed   Discharge Instructions     Amb Referral to Palliative Care   Complete by: As directed    Diet - low sodium heart healthy   Complete by: As directed    Discharge instructions   Complete by: As directed    Follow with Primary MD  [facility] in 1-2 weeks  Please ensure follow-up with oncology and palliative care.  Please get a complete blood count and chemistry panel checked by your Primary MD at your next visit, and again as instructed by your Primary MD.  Get Medicines reviewed and adjusted: Please take all your medications with you for your next visit with your Primary MD  Laboratory/radiological data: Please request your Primary MD to go over all hospital tests and procedure/radiological results at the follow up, please ask your Primary MD to get all Hospital records sent to his/her office.  In some cases, they will be blood work, cultures and biopsy results pending at the time of your discharge. Please request that your primary care M.D. follows up on these results.  Also Note the following: If you experience worsening of your admission symptoms, develop shortness of breath, life threatening emergency, suicidal or homicidal thoughts you must seek medical attention immediately by calling 911 or calling your MD immediately  if symptoms less severe.  You must read complete instructions/literature along with all the possible adverse reactions/side effects for all the Medicines you take and that have been prescribed to you. Take any new Medicines after you have completely  understood and accpet all the possible adverse reactions/side effects.   Do not drive when taking Pain medications or sleeping medications (Benzodaizepines)  Do not take more than prescribed Pain, Sleep and Anxiety Medications. It is not advisable to combine anxiety,sleep and pain medications without talking with your primary care practitioner  Special Instructions: If you have smoked or chewed Tobacco  in the last 2 yrs please stop smoking, stop any regular Alcohol  and or any Recreational drug use.  Wear Seat belts while driving.  Please note: You were cared for by a hospitalist during your hospital stay. Once you are discharged, your primary care physician will handle any further medical issues. Please note that NO REFILLS for any discharge medications will be authorized once you are discharged, as it is imperative that you return to your primary care physician (or establish a relationship with a primary care physician if you do not have one) for your post hospital discharge needs so that they can reassess your need for medications and monitor your lab values.   Increase activity slowly   Complete by: As directed    No dressing needed   Complete by: As directed       Allergies as of 09/07/2021       Reactions   Demerol [meperidine] Nausea And Vomiting, Other (See Comments)   Made the patient "violently sick"   Zocor [simvastatin] Nausea And Vomiting, Other (See Comments)   Made him very jittery, also   Beet [beta Vulgaris] Nausea And Vomiting  Liver Nausea And Vomiting        Medication List     STOP taking these medications    aspirin EC 81 MG tablet   metFORMIN 500 MG tablet Commonly known as: GLUCOPHAGE   methocarbamol 500 MG tablet Commonly known as: ROBAXIN   methylPREDNISolone 4 MG Tbpk tablet Commonly known as: MEDROL DOSEPAK   OVER THE COUNTER MEDICATION   PRESCRIPTION MEDICATION   traMADol 50 MG tablet Commonly known as: ULTRAM       TAKE these  medications    albuterol 108 (90 Base) MCG/ACT inhaler Commonly known as: VENTOLIN HFA Inhale 2 puffs into the lungs every 6 (six) hours as needed for wheezing. What changed: Another medication with the same name was changed. Make sure you understand how and when to take each.   albuterol (2.5 MG/3ML) 0.083% nebulizer solution Commonly known as: PROVENTIL Inhale 3 mLs (2.5 mg total) by nebulization every 4 (four) hours as needed for wheezing or shortness of breath. What changed: when to take this   apixaban 5 MG Tabs tablet Commonly known as: ELIQUIS Take 5 mg by mouth 2 (two) times daily.   b complex vitamins capsule Take 1 capsule by mouth daily.   buPROPion 300 MG 24 hr tablet Commonly known as: WELLBUTRIN XL Take 300 mg by mouth daily.   busPIRone 15 MG tablet Commonly known as: BUSPAR Take 15 mg by mouth daily.   calcium-vitamin D 500-200 MG-UNIT tablet Commonly known as: OSCAL WITH D Take 1 tablet by mouth 2 (two) times daily with a meal.   carboxymethylcellulose 0.5 % Soln Commonly known as: REFRESH PLUS Place 1 drop into both eyes 4 (four) times daily as needed (dry eyes).   clobetasol ointment 0.05 % Commonly known as: TEMOVATE Apply 1 Application topically 2 (two) times daily.   clotrimazole-betamethasone cream Commonly known as: LOTRISONE Apply 1 application. topically 2 (two) times daily as needed (fungus).   cycloSPORINE 0.05 % ophthalmic emulsion Commonly known as: RESTASIS Place 1 drop into both eyes 2 (two) times daily.   diltiazem 180 MG 24 hr capsule Commonly known as: CARDIZEM CD Take 180 mg by mouth daily.   DULoxetine 60 MG capsule Commonly known as: CYMBALTA Take 1 capsule (60 mg total) by mouth 2 (two) times daily.   feeding supplement Liqd Take 237 mLs by mouth 3 (three) times daily between meals. What changed: when to take this   fluticasone 50 MCG/ACT nasal spray Commonly known as: FLONASE Place 2 sprays into both nostrils  daily.   folic acid 1 MG tablet Commonly known as: FOLVITE Take 1 mg by mouth daily.   gabapentin 300 MG capsule Commonly known as: NEURONTIN Take 1 capsule (300 mg total) by mouth 2 (two) times daily.   GARLIC OIL PO Take 1 capsule by mouth daily.   guaiFENesin 600 MG 12 hr tablet Commonly known as: MUCINEX Take 1 tablet (600 mg total) by mouth 2 (two) times daily as needed for cough.   lidocaine 5 % Commonly known as: LIDODERM Place 2 patches onto the skin daily. Remove & Discard patch within 12 hours or as directed by MD   lurasidone 40 MG Tabs tablet Commonly known as: LATUDA Take 40 mg by mouth daily after supper.   melatonin 3 MG Tabs tablet Take 6 mg by mouth at bedtime.   Mometasone Furoate 200 MCG/ACT Aero Inhale 2 puffs into the lungs at bedtime.   mycophenolate 250 MG capsule Commonly known as: CELLCEPT Take 1,500  mg by mouth 2 (two) times daily.   nitroGLYCERIN 0.4 MG SL tablet Commonly known as: NITROSTAT Place 1 tablet (0.4 mg total) under the tongue every 5 (five) minutes as needed for chest pain.   oxcarbazepine 600 MG tablet Commonly known as: TRILEPTAL Take 600-900 mg by mouth See admin instructions. Take 600mg  by mouth during the day, then take  900 mg by mouth at bedtime   oxyCODONE 20 mg 12 hr tablet Commonly known as: OxyCONTIN Take 1 tablet (20 mg total) by mouth every 12 (twelve) hours. What changed: Another medication with the same name was added. Make sure you understand how and when to take each.   oxyCODONE 5 MG immediate release tablet Commonly known as: Oxy IR/ROXICODONE Take 1 tablet (5 mg total) by mouth every 4 (four) hours as needed for severe pain. What changed: You were already taking a medication with the same name, and this prescription was added. Make sure you understand how and when to take each.   OXYGEN Inhale 2 L into the lungs continuous.   oxymetazoline 0.05 % nasal spray Commonly known as: AFRIN Place 2 sprays  into both nostrils 2 (two) times daily for 3 days.   polyethylene glycol 17 g packet Commonly known as: MIRALAX / GLYCOLAX Take 17 g by mouth daily.   senna-docusate 8.6-50 MG tablet Commonly known as: Senokot-S Take 1 tablet by mouth 2 (two) times daily.   sodium chloride 0.65 % Soln nasal spray Commonly known as: OCEAN Place 2 sprays into both nostrils 4 (four) times daily as needed for congestion.   Striverdi Respimat 2.5 MCG/ACT Aers Generic drug: Olodaterol HCl Inhale 2 puffs into the lungs daily.   tamsulosin 0.4 MG Caps capsule Commonly known as: FLOMAX Take 0.4 mg by mouth daily.   traZODone 50 MG tablet Commonly known as: DESYREL Take 75 mg by mouth at bedtime.   vitamin C 500 MG tablet Commonly known as: ASCORBIC ACID Take 500 mg by mouth 2 (two) times daily.   Vitamin D-3 25 MCG (1000 UT) Caps Take 1,000 Units by mouth 2 (two) times daily.               Discharge Care Instructions  (From admission, onward)           Start     Ordered   09/06/21 0000  No dressing needed        09/06/21 1014            Contact information for follow-up providers     [facility] up.   Contact information: [place] [address] [phone]         Curt Bears, MD. Schedule an appointment as soon as possible for a visit in 2 week(s).   Specialty: Oncology Contact information: [place] [address] [phone]              Contact information for after-discharge care     [facility] .   Service: Skilled Nursing Contact information: [place] [address] [phone]                    Allergies  Allergen Reactions   Demerol [Meperidine] Nausea And Vomiting and Other (See Comments)    Made the patient "violently sick"   Zocor [Simvastatin] Nausea And Vomiting and  Other (See Comments)    Made him very jittery,  also   Beet [Beta Vulgaris] Nausea And Vomiting   Liver Nausea And Vomiting     Other Procedures/Studies: DG Chest 1 View  Result Date: 09/01/2021 CLINICAL DATA:  Status post left PleurX catheter removal. EXAM: CHEST  1 VIEW COMPARISON:  Chest x-ray dated August 28, 2021. FINDINGS: Interval removal of the left-sided PleurX catheter. No pneumothorax. Unchanged small loculated left pleural effusion and left base bulla with air-fluid level. Unchanged left basilar airspace disease and/or atelectasis. The lungs remain hyperinflated with chronically coarsened interstitial markings. Stable cardiomediastinal silhouette with normal heart size. IMPRESSION: 1. Interval left PleurX catheter removal. No pneumothorax. 2. Otherwise unchanged exam. Electronically Signed   By: Titus Dubin M.D.   On: 09/01/2021 15:50   IR Removal Of Plural Cath W/Cuff  Result Date: 09/01/2021 INDICATION: Recurrent malignant left pleural effusion with previous placement of tunneled pleural catheter. Catheter no longer functioning. Patient requesting removal. It was initially placed by Dr. Annamaria Boots on August 04, 2021. EXAM: REMOVAL OF TUNNELED PLEURAL CATHETER MEDICATIONS: 1% lidocaine 8 mL COMPLICATIONS: None immediate. PROCEDURE: Informed written consent was obtained from the patient following an explanation of the procedure, risks, benefits and alternatives to treatment. A time out was performed prior to the initiation of the procedure. Maximal barrier sterile technique was utilized including caps, mask, sterile gloves, large sterile drape, and hand hygiene. ChloraPrep was used to prep the patient's left lateral chest and existing catheter. The cuff was palpated about 2 inches above the exit site of the catheter. 1% lidocaine was injected around the cuff of the catheter . Using a #15 blade, a small incision was made over the cuff. The catheter was dissected out using scissors and curved hemostats  until the cuff was freed from the surrounding fibrous sheath and the catheter was removed without difficulty. Next a 3-0 Monocryl suture was used to approximate on the skin only at the incision site. Clean dry dressing was placed over the sites. The patient tolerated the procedure well without immediate post procedural complication. IMPRESSION: Successful removal of tunneled pleural catheter. Procedure performed by: Gareth Eagle, PA-C Electronically Signed   By: Michaelle Birks M.D.   On: 09/01/2021 10:52   DG Chest 2 View  Result Date: 08/28/2021 CLINICAL DATA:  Followup pleural effusion. EXAM: CHEST - 2 VIEW COMPARISON:  08/23/2021. FINDINGS: Left lung base opacity consistent with the small effusion and associated parenchymal opacity noted on the previous CT scan. Within this there is a rounded area of air density with a dependent fluid level consistent with the localized cyst/cavity noted on the prior CT scan. A chest tube extends from the lateral lower hemithorax along the left heart border, tip along the anterior medial left pleural margin, stable from the prior CT. Right lung is hyperexpanded, but clear. Both lungs show thickened interstitial markings, chronic. No pneumothorax. IMPRESSION: 1. Findings are without significant change compared to the recent prior chest CT. Stable left chest tube. 2. Small left effusion with associated lung base opacity consistent with atelectasis, infection or a combination. 3. Stable air and fluid containing cavity at the left lung base, suspected to be an infected bullae. Bilateral lung base bullae noted on older CT scans of the chest. 4. Underlying emphysema and chronic interstitial thickening. Electronically Signed   By: Lajean Manes M.D.   On: 08/28/2021 14:44   DG Lumbar Spine 2-3 Views  Result Date: 08/26/2021 CLINICAL DATA:  Injury. EXAM: LUMBAR SPINE - 2-3 VIEW COMPARISON:  Lumbar spine x-ray 07/22/2020 FINDINGS: There is no  evidence of lumbar spine fracture.  Alignment is normal. Degenerative disc space changes appear similar to the prior examination with endplate osteophytes and sclerosis. This is most significant at L4-L5, unchanged. IMPRESSION: 1. No acute fracture or malalignment. 2. Stable degenerative changes. Electronically Signed   By: Ronney Asters M.D.   On: 08/26/2021 18:08   CT Angio Chest Pulmonary Embolism (PE) W or WO Contrast  Result Date: 08/23/2021 CLINICAL DATA:  Shortness of breath. EXAM: CT ANGIOGRAPHY CHEST WITH CONTRAST TECHNIQUE: Multidetector CT imaging of the chest was performed using the standard protocol during bolus administration of intravenous contrast. Multiplanar CT image reconstructions and MIPs were obtained to evaluate the vascular anatomy. RADIATION DOSE REDUCTION: This exam was performed according to the departmental dose-optimization program which includes automated exposure control, adjustment of the mA and/or kV according to patient size and/or use of iterative reconstruction technique. CONTRAST:  64mL OMNIPAQUE IOHEXOL 350 MG/ML SOLN COMPARISON:  CT angiogram chest 08/03/2021 FINDINGS: Cardiovascular: Satisfactory opacification of the pulmonary arteries to the segmental level. No evidence of pulmonary embolism. Normal heart size. No pericardial effusion. Mediastinum/Nodes: No enlarged mediastinal, hilar, or axillary lymph nodes. Thyroid gland, trachea, and esophagus demonstrate no significant findings. Lungs/Pleura: There is a partially loculated left pleural effusion which has significantly decreased in size. New percutaneous pleural catheter is seen in the left lower hemithorax. Severe emphysematous changes are again seen including bullae in the lower lobes. There is a new air-fluid level in a bulla in the left lower lobe with overall measurements of 5.3 x 4.8 cm compatible with infection. There is patchy airspace consolidation in the left lower lobe compatible with infection. There are minimal ground-glass opacities in the  lingula, new from prior. There are few ill-defined nodular densities in the left upper lobe measuring up to 6 mm image 7/44. There is no evidence for pneumothorax. Trachea and central airways appear patent. Upper Abdomen: No acute abnormality. Musculoskeletal: Left lateral seventh rib fracture is minimally displaced in similar to the prior examination. There is a new nondisplaced fracture of the left lateral eighth rib. Degenerative changes affect the spine. Review of the MIP images confirms the above findings. IMPRESSION: 1. No evidence for pulmonary embolism. 2. New/acute left eighth rib fracture. Stable left seventh rib fracture. 3. Loculated left pleural effusion has decreased in size. New pleural drainage catheter in place. 4. There is left lower lobe airspace consolidation and ground-glass opacities in the lingula compatible with infection. 5. There is an air-fluid level within the left lower lobe bulla compatible with infection. Emphysema (ICD10-J43.9). Electronically Signed   By: Ronney Asters M.D.   On: 08/23/2021 23:17   DG Chest 1 View  Result Date: 08/23/2021 CLINICAL DATA:  Shortness of breath EXAM: CHEST  1 VIEW COMPARISON:  08/21/2021 FINDINGS: Stable cardiomediastinal contours. Low lung volumes. Persistent left basilar opacity with small left pleural effusion. Air-filled left basilar bulla/cavity is grossly unchanged. Improving aeration of the right lung base. No pneumothorax. IMPRESSION: Persistent left basilar opacity and small left pleural effusion. Improving aeration of the right lung base. Electronically Signed   By: Davina Poke D.O.   On: 08/23/2021 19:19   [place] CHEST (PLEURAL EFFUSION)  Result Date: 08/22/2021 CLINICAL DATA:  Evaluate left pleural effusion. EXAM: CHEST ULTRASOUND COMPARISON:  Chest radiograph 08/21/2021 FINDINGS: Targeted ultrasound of the left pleural space was performed demonstrating a small complex left pleural effusion. IMPRESSION: Small complex left pleural  effusion. Electronically Signed   By: Kerby Moors M.D.   On: 08/22/2021 14:22  DG Chest Port 1 View  Result Date: 08/21/2021 CLINICAL DATA:  Shortness of breath EXAM: PORTABLE CHEST 1 VIEW COMPARISON:  August 20, 2021 FINDINGS: Stable opacity in the right base. Effusion and underlying opacity on the left is mildly more prominent the interval. No pneumothorax. The cardiomediastinal silhouette is stable. No other interval changes. IMPRESSION: 1. Stable opacity in the right base could represent atelectasis given its platelike appearance. Infiltrate not excluded. Recommend attention on follow-up. 2. Effusion and underlying opacity in the left base is mildly more prominent in the interval. Recommend continued attention on follow-up. 3. No other interval changes. Electronically Signed   By: Dorise Bullion III M.D.   On: 08/21/2021 08:32   US RENAL  Result Date: 08/21/2021 CLINICAL DATA:  Acute renal injury EXAM: RENAL / URINARY TRACT ULTRASOUND COMPLETE COMPARISON:  None Available. FINDINGS: Right Kidney: Renal measurements: 10.3 x 5.8 x 5.9 cm. = volume: 185 mL. 2.2 cm cyst is noted within the right kidney in the upper pole. No follow-up is recommended. No mass or hydronephrosis is noted. Left Kidney: Renal measurements: 12.3 x 6.5 x 6.7 cm. = volume: 281. mL. Echogenicity within normal limits. No mass or hydronephrosis visualized. Bladder: Appears normal for degree of bladder distention. Other: None. IMPRESSION: Right renal cyst.  No follow-up is recommended. No other focal abnormality is noted. Electronically Signed   By: Inez Catalina M.D.   On: 08/21/2021 01:11   CT Cervical Spine Wo Contrast  Result Date: 08/20/2021 CLINICAL DATA:  Trauma.  Fall. EXAM: CT CERVICAL SPINE WITHOUT CONTRAST TECHNIQUE: Multidetector CT imaging of the cervical spine was performed without intravenous contrast. Multiplanar CT image reconstructions were also generated. RADIATION DOSE REDUCTION: This exam was performed according  to the departmental dose-optimization program which includes automated exposure control, adjustment of the mA and/or kV according to patient size and/or use of iterative reconstruction technique. COMPARISON:  None Available. FINDINGS: Alignment: Normal. Skull base and vertebrae: Mildly technically limited secondary to motion artifact. No acute fracture. No primary bone lesion or focal pathologic process. Soft tissues and spinal canal: No prevertebral fluid or swelling. No visible canal hematoma. Disc levels: No significant central canal or neural foraminal stenosis at any level. Upper chest: There are emphysematous changes in the lung apices. Other: None. IMPRESSION: Limited by motion artifact. No acute fracture or traumatic subluxation of the cervical spine. Electronically Signed   By: Ronney Asters M.D.   On: 08/20/2021 19:57   CT Head Wo Contrast  Result Date: 08/20/2021 CLINICAL DATA:  Trauma, fall EXAM: CT HEAD WITHOUT CONTRAST TECHNIQUE: Contiguous axial images were obtained from the base of the skull through the vertex without intravenous contrast. RADIATION DOSE REDUCTION: This exam was performed according to the departmental dose-optimization program which includes automated exposure control, adjustment of the mA and/or kV according to patient size and/or use of iterative reconstruction technique. COMPARISON:  06/25/2021 FINDINGS: Brain: No acute intracranial findings are seen in noncontrast CT brain. There are no signs of bleeding within the cranium. Cortical sulci are prominent. There is decreased density in the periventricular white matter. Vascular: Unremarkable. Skull: No fracture is seen in the calvarium. Sinuses/Orbits: There are no air-fluid levels in the paranasal sinuses. Other: No significant interval changes are noted. IMPRESSION: No acute intracranial findings are seen in noncontrast CT brain. Atrophy. Small-vessel disease. Electronically Signed   By: Elmer Picker M.D.   On:  08/20/2021 19:56   DG Chest Port 1 View  Result Date: 08/20/2021 CLINICAL DATA:  [place]. EXAM:  PORTABLE CHEST 1 VIEW COMPARISON:  08/19/2021 FINDINGS: The heart is within normal limits in size and stable. There is a left-sided chest tube in place. Bibasilar atelectasis and scarring. Underlying emphysema. No definite new/acute process. IMPRESSION: 1. Chronic left basilar process with left-sided chest tube in place. 2. No new/acute findings. Electronically Signed   By: Marijo Sanes M.D.   On: 08/20/2021 18:42   DG Pelvis Portable  Result Date: 08/20/2021 CLINICAL DATA:  [place]. EXAM: PORTABLE PELVIS 1-2 VIEWS COMPARISON:  None Available. FINDINGS: There is no evidence of pelvic fracture or diastasis. No pelvic bone lesions are seen. IMPRESSION: Negative. Electronically Signed   By: Rolm Baptise M.D.   On: 08/20/2021 18:40   DG Chest 2 View  Result Date: 08/19/2021 CLINICAL DATA:  Shortness of breath EXAM: CHEST - 2 VIEW COMPARISON:  08/07/2021, chest CT 08/03/2021, radiograph 08/03/2021, 07/04/2021, 01/03/2020 FINDINGS: Left-sided chest drainage catheter with tip anteriorly. Residual left pleural effusion and heterogeneous airspace disease at the left base. Heterogeneous airspace disease at the right base. Cystic abnormality at the left base corresponding to bulla, without change. Stable cardiomediastinal silhouette IMPRESSION: 1. Not much interval change in radiographic appearance of the chest since 08/07/2021. Residual left effusion and left greater than right basilar airspace disease Electronically Signed   By: Donavan Foil M.D.   On: 08/19/2021 23:58     TODAY-DAY OF DISCHARGE:  Subjective:   Mario [name] today has no headache,no chest abdominal pain,no new weakness tingling or numbness, feels much better wants to go home today.   Objective:   Blood pressure 123/66, pulse 87, temperature 97.6 F (36.4 C), temperature source Axillary, resp. rate 16, height 6\' 2"  (1.88 m), weight (!) 147 kg,  SpO2 98 %. No intake or output data in the 24 hours ending 09/07/21 0907 Filed Weights   08/23/21 1859  Weight: (!) 147 kg    Exam: Awake Alert, Oriented *3, No new F.N deficits, Normal affect [place].AT,PERRAL Supple Neck,No JVD, No cervical lymphadenopathy appriciated.  Symmetrical Chest wall movement, Good air movement bilaterally, CTAB RRR,No Gallops,Rubs or new Murmurs, No Parasternal Heave +ve B.Sounds, Abd Soft, Non tender, No organomegaly appriciated, No rebound -guarding or rigidity. No Cyanosis, Clubbing or edema, No new Rash or bruise   PERTINENT RADIOLOGIC STUDIES: No results found.   PERTINENT LAB RESULTS: CBC: No results for input(s): "WBC", "HGB", "HCT", "PLT" in the last 72 hours. CMET CMP     Component Value Date/Time   NA 133 (L) 09/01/2021 0141   K 4.0 09/01/2021 0141   CL 96 (L) 09/01/2021 0141   CO2 26 09/01/2021 0141   GLUCOSE 108 (H) 09/01/2021 0141   BUN 14 09/01/2021 0141   CREATININE 0.93 09/01/2021 0141   CREATININE 1.05 07/20/2021 0857   CALCIUM 8.7 (L) 09/01/2021 0141   PROT 5.7 (L) 09/01/2021 0141   ALBUMIN 3.0 (L) 09/01/2021 0141   AST 12 (L) 09/01/2021 0141   AST 17 07/20/2021 0857   ALT 13 09/01/2021 0141   ALT 19 07/20/2021 0857   ALKPHOS 70 09/01/2021 0141   BILITOT 0.1 (L) 09/01/2021 0141   BILITOT 0.3 07/20/2021 0857   GFRNONAA >60 09/01/2021 0141   GFRNONAA >60 07/20/2021 0857   GFRAA 53 (L) 07/11/2019 2056    GFR Estimated Creatinine Clearance: 114.6 mL/min (by C-G formula based on SCr of 0.93 mg/dL). No results for input(s): "LIPASE", "AMYLASE" in the last 72 hours. No results for input(s): "CKTOTAL", "CKMB", "CKMBINDEX", "TROPONINI" in the last 72 hours. Invalid input(s): "  POCBNP" No results for input(s): "DDIMER" in the last 72 hours. No results for input(s): "HGBA1C" in the last 72 hours. No results for input(s): "CHOL", "HDL", "LDLCALC", "TRIG", "CHOLHDL", "LDLDIRECT" in the last 72 hours. No results for input(s):  "TSH", "T4TOTAL", "T3FREE", "THYROIDAB" in the last 72 hours.  Invalid input(s): "FREET3" No results for input(s): "VITAMINB12", "FOLATE", "FERRITIN", "TIBC", "IRON", "RETICCTPCT" in the last 72 hours. Coags: No results for input(s): "INR" in the last 72 hours.  Invalid input(s): "PT" Microbiology: No results found for this or any previous visit (from the past 240 hour(s)).  FURTHER DISCHARGE INSTRUCTIONS:  Get Medicines reviewed and adjusted: Please take all your medications with you for your next visit with your Primary MD  Laboratory/radiological data: Please request your Primary MD to go over all hospital tests and procedure/radiological results at the follow up, please ask your Primary MD to get all Hospital records sent to his/her office.  In some cases, they will be blood work, cultures and biopsy results pending at the time of your discharge. Please request that your primary care M.D. goes through all the records of your hospital data and follows up on these results.  Also Note the following: If you experience worsening of your admission symptoms, develop shortness of breath, life threatening emergency, suicidal or homicidal thoughts you must seek medical attention immediately by calling 911 or calling your MD immediately  if symptoms less severe.  You must read complete instructions/literature along with all the possible adverse reactions/side effects for all the Medicines you take and that have been prescribed to you. Take any new Medicines after you have completely understood and accpet all the possible adverse reactions/side effects.   Do not drive when taking Pain medications or sleeping medications (Benzodaizepines)  Do not take more than prescribed Pain, Sleep and Anxiety Medications. It is not advisable to combine anxiety,sleep and pain medications without talking with your primary care practitioner  Special Instructions: If you have smoked or chewed Tobacco  in the last  2 yrs please stop smoking, stop any regular Alcohol  and or any Recreational drug use.  Wear Seat belts while driving.  Please note: You were cared for by a hospitalist during your hospital stay. Once you are discharged, your primary care physician will handle any further medical issues. Please note that NO REFILLS for any discharge medications will be authorized once you are discharged, as it is imperative that you return to your primary care physician (or establish a relationship with a primary care physician if you do not have one) for your post hospital discharge needs so that they can reassess your need for medications and monitor your lab values.  Total Time spent coordinating discharge including counseling, education and face to face time equals greater than 30 minutes.  SignedOren Binet 09/07/2021 9:07 AM

## 2021-09-06 NOTE — Plan of Care (Signed)
  Problem: Activity: Goal: Ability to tolerate increased activity will improve Outcome: Progressing   Problem: Respiratory: Goal: Ability to maintain adequate ventilation will improve Outcome: Progressing   Problem: Coping: Goal: Level of anxiety will decrease Outcome: Progressing

## 2021-09-07 MED ORDER — APIXABAN 5 MG PO TABS: 5.0000 mg | ORAL_TABLET | Freq: Two times a day (BID) | ORAL | Status: DC

## 2021-09-07 NOTE — Progress Notes (Signed)
No further nose bleed since yesterday afternoon Vitals stable Stable for d/c toSNF today

## 2021-09-07 NOTE — Progress Notes (Signed)
Rn called [phone] to give report. No answer and no voicemail available.

## 2021-09-07 NOTE — TOC Transition Note (Signed)
Transition of Care [facility]) - CM/SW Discharge Note   Patient Details  Name: Mario [name] MRN: [id number] Date of Birth: Mar 22, 1950  Transition of Care [facility] CM/SW Contact:  Benard Halsted, LCSW Phone Number: 09/07/2021, 9:21 AM   Clinical Narrative:    Patient will DC to: [facility] Anticipated DC date: 09/07/21 Family notified: Pt declined Transport by: [place]: Ref# [id number], Auth ID# [id number], effective 09/06/2021-09/08/2021.   Per MD patient ready for DC to [facility]. RN to call report prior to discharge ([phone], A3 rm 1). RN, patient, patient's family, and facility notified of DC. Discharge Summary and FL2 sent to facility. DC packet on chart. Ambulance transport requested for patient.   CSW will sign off for now as social work intervention is no longer needed. Please consult [place] again if new needs arise.     Final next level of care: Skilled Nursing Facility Barriers to Discharge: Barriers Resolved   Patient Goals and CMS Choice Patient states their goals for this hospitalization and ongoing recovery are:: Rehab CMS Medicare.gov Compare Post Acute Care list provided to:: Patient Choice offered to / list presented to : Patient  Discharge Placement   Existing PASRR number confirmed : 09/07/21          Patient chooses bed at:  [facility]) Patient to be transferred to facility by: [place] Name of family member notified: Patient Patient and family notified of of transfer: 09/07/21  Discharge Plan and Services In-house Referral: Clinical Social Work   Post Acute Care Choice: [place]                               Social Determinants of Health (SDOH) Interventions     Readmission Risk Interventions    08/23/2021   11:48 AM 08/05/2021   11:02 AM 06/07/2021    4:15 PM  Readmission Risk Prevention Plan  Transportation Screening Complete Complete Complete  Medication Review Press photographer) Complete Complete  Complete  PCP or Specialist appointment within 3-5 days of discharge Complete Complete Complete  HRI or Home Care Consult Complete Complete Complete  SW Recovery Care/Counseling Consult Complete Complete Complete  Palliative Care Screening Not Applicable Not Applicable Not [place] Not Applicable Not Applicable Not Applicable

## 2021-09-12 ENCOUNTER — Emergency Department (HOSPITAL_COMMUNITY)
Admission: EM | Admit: 2021-09-12 | Discharge: 2021-09-13 | Disposition: A | Discharge status: Skilled Nursing Facility | Payer: No Typology Code available for payment source | Attending: Emergency Medicine | Admitting: Emergency Medicine

## 2021-09-12 ENCOUNTER — Encounter (HOSPITAL_COMMUNITY): Payer: Self-pay

## 2021-09-12 ENCOUNTER — Emergency Department (HOSPITAL_COMMUNITY): Payer: No Typology Code available for payment source

## 2021-09-12 ENCOUNTER — Other Ambulatory Visit: Payer: Self-pay

## 2021-09-12 DIAGNOSIS — R1084 Generalized abdominal pain: Secondary | ICD-10-CM | POA: Diagnosis present

## 2021-09-12 DIAGNOSIS — K5900 Constipation, unspecified: Secondary | ICD-10-CM

## 2021-09-12 DIAGNOSIS — D75839 Thrombocytosis, unspecified: Secondary | ICD-10-CM

## 2021-09-12 DIAGNOSIS — S2242XA Multiple fractures of ribs, left side, initial encounter for closed fracture: Secondary | ICD-10-CM

## 2021-09-12 DIAGNOSIS — R1012 Left upper quadrant pain: Secondary | ICD-10-CM

## 2021-09-12 DIAGNOSIS — Z79899 Other long term (current) drug therapy: Secondary | ICD-10-CM | POA: Diagnosis not present

## 2021-09-12 DIAGNOSIS — R200 Anesthesia of skin: Secondary | ICD-10-CM | POA: Insufficient documentation

## 2021-09-12 DIAGNOSIS — Z7901 Long term (current) use of anticoagulants: Secondary | ICD-10-CM | POA: Insufficient documentation

## 2021-09-12 DIAGNOSIS — D649 Anemia, unspecified: Secondary | ICD-10-CM

## 2021-09-12 LAB — CBC
HCT: 34.4 % — ABNORMAL LOW (ref 39.0–52.0)
Hemoglobin: 11 g/dL — ABNORMAL LOW (ref 13.0–17.0)
MCH: 27.8 pg (ref 26.0–34.0)
MCHC: 32 g/dL (ref 30.0–36.0)
MCV: 86.9 fL (ref 80.0–100.0)
Platelets: 421 10*3/uL — ABNORMAL HIGH (ref 150–400)
RBC: 3.96 MIL/uL — ABNORMAL LOW (ref 4.22–5.81)
RDW: 15 % (ref 11.5–15.5)
WBC: 10.3 10*3/uL (ref 4.0–10.5)
nRBC: 0 % (ref 0.0–0.2)

## 2021-09-12 LAB — COMPREHENSIVE METABOLIC PANEL
ALT: 12 U/L (ref 0–44)
AST: 15 U/L (ref 15–41)
Albumin: 3.6 g/dL (ref 3.5–5.0)
Alkaline Phosphatase: 79 U/L (ref 38–126)
Anion gap: 8 (ref 5–15)
BUN: 14 mg/dL (ref 8–23)
CO2: 27 mmol/L (ref 22–32)
Calcium: 8.7 mg/dL — ABNORMAL LOW (ref 8.9–10.3)
Chloride: 101 mmol/L (ref 98–111)
Creatinine, Ser: 0.98 mg/dL (ref 0.61–1.24)
GFR, Estimated: >60 mL/min (ref >60)
Glucose, Bld: 107 mg/dL — ABNORMAL HIGH (ref 70–99)
Potassium: 4.1 mmol/L (ref 3.5–5.1)
Sodium: 136 mmol/L (ref 135–145)
Total Bilirubin: 0.3 mg/dL (ref 0.3–1.2)
Total Protein: 6.7 g/dL (ref 6.5–8.1)

## 2021-09-12 MED ORDER — IOHEXOL 300 MG/ML  SOLN
100.0000 mL | Freq: Once | INTRAMUSCULAR | Status: AC | PRN
  Administered 2021-09-13: 100 mL via INTRAVENOUS

## 2021-09-12 MED ORDER — ONDANSETRON HCL 4 MG/2ML IJ SOLN
4.0000 mg | Freq: Once | INTRAMUSCULAR | Status: AC
  Administered 2021-09-12: 4 mg via INTRAVENOUS
  Filled 2021-09-12: qty 2

## 2021-09-12 NOTE — ED Triage Notes (Signed)
Pt had left side chest tube removed from recent lung fluid drainage, pt has stage 4 lung cancer  patient c/o today that left ABD feels numb around post insertion site with pain in left ABD.  Patient states he has 3 cracked ribs on left side

## 2021-09-12 NOTE — ED Provider Notes (Signed)
[facility] EMERGENCY DEPARTMENT Provider Note   CSN: [id number] Arrival date & time: 09/12/21  2020     History  Chief Complaint  Patient presents with   Abdominal Pain    Pt had left side tube removed from recent lung fluid drainage, pt has stage 4 lung cancer  patient c/o today that left ABD feels numb around post insertion site with pain in left ABD.  Patient states he has 3 cracked ribs on left side    Mario [name] is a 71 y.o. male.   Abdominal Pain Patient presents abdominal pain.  Has stage IV lung cancer.  Recent Pleur-evac on left chest.  Removed couple weeks ago.  Now states more abdominal pain.  States left side abdomen is numb.  States he was constipated but now having extra bowel movements.  Pain is dull.  No fevers.  No dysuria.  Not feeling short of breath.  Some mild nausea.     Home Medications Prior to Admission medications   Medication Sig Start Date End Date Taking? Authorizing Provider  albuterol (PROVENTIL) (2.5 MG/3ML) 0.083% nebulizer solution Inhale 3 mLs (2.5 mg total) by nebulization every 4 (four) hours as needed for wheezing or shortness of breath. Patient taking differently: Take 2.5 mg by nebulization every 6 (six) hours as needed for wheezing or shortness of breath. 08/04/20 08/23/21  Lavina Hamman, MD  albuterol (VENTOLIN HFA) 108 (90 Base) MCG/ACT inhaler Inhale 2 puffs into the lungs every 6 (six) hours as needed for wheezing. 06/02/20   [provider]  apixaban (ELIQUIS) 5 MG TABS tablet Take 5 mg by mouth 2 (two) times daily.    [provider]  b complex vitamins capsule Take 1 capsule by mouth daily.    [provider]  buPROPion (WELLBUTRIN XL) 300 MG 24 hr tablet Take 300 mg by mouth daily. 02/05/20   [provider]  busPIRone (BUSPAR) 15 MG tablet Take 15 mg by mouth daily.    [provider]  calcium-vitamin D (OSCAL WITH D) 500-200 MG-UNIT tablet Take 1 tablet by mouth 2 (two) times daily  with a meal.    [provider]  carboxymethylcellulose (REFRESH PLUS) 0.5 % SOLN Place 1 drop into both eyes 4 (four) times daily as needed (dry eyes).    [provider]  Cholecalciferol (VITAMIN D-3) 25 MCG (1000 UT) CAPS Take 1,000 Units by mouth 2 (two) times daily.    [provider]  clobetasol ointment (TEMOVATE) 9.62 % Apply 1 Application topically 2 (two) times daily.    [provider]  clotrimazole-betamethasone (LOTRISONE) cream Apply 1 application. topically 2 (two) times daily as needed (fungus). 07/06/21   Aline August, MD  cycloSPORINE (RESTASIS) 0.05 % ophthalmic emulsion Place 1 drop into both eyes 2 (two) times daily.    [provider]  diltiazem (CARDIZEM CD) 180 MG 24 hr capsule Take 180 mg by mouth daily.    [provider]  DULoxetine (CYMBALTA) 60 MG capsule Take 1 capsule (60 mg total) by mouth 2 (two) times daily. 07/21/17   Lavina Hamman, MD  feeding supplement (ENSURE ENLIVE / ENSURE PLUS) LIQD Take 237 mLs by mouth 3 (three) times daily between meals. Patient taking differently: Take 1 Bottle by mouth daily. 08/04/20   Lavina Hamman, MD  fluticasone Asencion Islam) 50 MCG/ACT nasal spray Place 2 sprays into both nostrils daily.     [provider]  folic acid (FOLVITE) 1 MG tablet Take 1  mg by mouth daily.    [provider]  gabapentin (NEURONTIN) 300 MG capsule Take 1 capsule (300 mg total) by mouth 2 (two) times daily. 07/06/21   Aline August, MD  GARLIC OIL PO Take 1 capsule by mouth daily.    [provider]  guaiFENesin (MUCINEX) 600 MG 12 hr tablet Take 1 tablet (600 mg total) by mouth 2 (two) times daily as needed for cough. 07/06/21   Aline August, MD  lidocaine (LIDODERM) 5 % Place 2 patches onto the skin daily. Remove & Discard patch within 12 hours or as directed by MD 09/06/21   Jonetta Osgood, MD  lurasidone (LATUDA) 40 MG TABS tablet Take 40 mg by mouth daily after supper.     [provider]  Melatonin 3 MG TABS Take 6 mg by mouth at bedtime.    [provider]  Mometasone Furoate 200 MCG/ACT AERO Inhale 2 puffs into the lungs at bedtime.    [provider]  mycophenolate (CELLCEPT) 250 MG capsule Take 1,500 mg by mouth 2 (two) times daily.    [provider]  nitroGLYCERIN (NITROSTAT) 0.4 MG SL tablet Place 1 tablet (0.4 mg total) under the tongue every 5 (five) minutes as needed for chest pain. Patient not taking: Reported on 08/20/2021 07/03/19   Edwin Dada, MD  Olodaterol HCl (STRIVERDI RESPIMAT) 2.5 MCG/ACT AERS Inhale 2 puffs into the lungs daily.    [provider]  oxcarbazepine (TRILEPTAL) 600 MG tablet Take 600-900 mg by mouth See admin instructions. Take 600mg  by mouth during the day, then take  900 mg by mouth at bedtime 07/16/20   [provider]  oxyCODONE (OXY IR/ROXICODONE) 5 MG immediate release tablet Take 1 tablet (5 mg total) by mouth every 4 (four) hours as needed for severe pain. 09/06/21   Ghimire, Henreitta Leber, MD  oxyCODONE (OXYCONTIN) 20 mg 12 hr tablet Take 1 tablet (20 mg total) by mouth every 12 (twelve) hours. 09/06/21   Ghimire, Henreitta Leber, MD  OXYGEN Inhale 2 L into the lungs continuous.    [provider]  polyethylene glycol (MIRALAX / GLYCOLAX) 17 g packet Take 17 g by mouth daily.    [provider]  senna-docusate (SENOKOT-S) 8.6-50 MG tablet Take 1 tablet by mouth 2 (two) times daily. 07/06/21   Aline August, MD  sodium chloride (OCEAN) 0.65 % SOLN nasal spray Place 2 sprays into both nostrils 4 (four) times daily as needed for congestion.    [provider]  tamsulosin (FLOMAX) 0.4 MG CAPS capsule Take 0.4 mg by mouth daily.    [provider]  traZODone (DESYREL) 50 MG tablet Take 75 mg by mouth at bedtime.    [provider]  vitamin C (ASCORBIC ACID) 500 MG tablet Take 500 mg by mouth 2 (two) times daily.    [provider]      Allergies    Demerol [meperidine], Zocor [simvastatin], Beet [beta vulgaris], and Liver    Review of Systems   Review of Systems  Gastrointestinal:  Positive for abdominal pain.    Physical Exam Updated Vital Signs BP (!) 146/87   Pulse 88   Temp 98.7 F (37.1 C) (Oral)   Resp (!) 26   Ht 6\' 2"  (1.88 m)   Wt (!) 138.3 kg   SpO2 94%   BMI 39.16 kg/m  Physical Exam Vitals and nursing note reviewed.  HENT:     Head: Atraumatic.  Cardiovascular:  Rate and Rhythm: Regular rhythm.  Abdominal:     Comments: Numbness in left upper abdomen.  Initially somewhat soft with only mild tenderness.  May be some mild fullness.  On reexamination later however had more tenderness and more diffuse fullness of the abdomen.  No hernia palpated.  Left lateral chest wall his sites from recent drain.  Skin:    Capillary Refill: Capillary refill takes less than 2 seconds.  Neurological:     Mental Status: He is alert and oriented to person, place, and time.     ED Results / Procedures / Treatments   Labs (all labs ordered are listed, but only abnormal results are displayed) Labs Reviewed  COMPREHENSIVE METABOLIC PANEL - Abnormal; Notable for the following components:      Result Value   Glucose, Bld 107 (*)    Calcium 8.7 (*)    All other components within normal limits  CBC - Abnormal; Notable for the following components:   RBC 3.96 (*)    Hemoglobin 11.0 (*)    HCT 34.4 (*)    Platelets 421 (*)    All other components within normal limits    EKG None  Radiology DG Abd 2 Views  Result Date: 09/12/2021 CLINICAL DATA:  Left chest tube removal. EXAM: ABDOMEN - 2 VIEW COMPARISON:  May 19, 2021 FINDINGS: Fluid and opacity remains in the left base. Left rib fractures are noted. No free air, portal venous gas, or pneumatosis. Mild fecal loading in the colon. No bowel obstruction. IMPRESSION: 1. Pleural fluid and opacity are again identified in the left base. Left-sided rib  fractures noted. 2. No acute abnormalities in the abdomen. Electronically Signed   By: Dorise Bullion III M.D.   On: 09/12/2021 22:03   DG Chest 2 View  Result Date: 09/12/2021 CLINICAL DATA:  Left chest tube removed from recent lung fluid drainage. Patient with stage IV lung cancer. EXAM: CHEST - 2 VIEW COMPARISON:  July 5th 2023 FINDINGS: Stable opacity in the right base. Stable effusion and opacity in the left base. No pneumothorax. The cardiomediastinal silhouette is stable. No other interval changes. IMPRESSION: Stable opacity in the right base. Stable opacity in left base with associated effusion. No interval changes. Electronically Signed   By: Dorise Bullion III M.D.   On: 09/12/2021 22:02    Procedures Procedures    Medications Ordered in ED Medications  ondansetron (ZOFRAN) injection 4 mg (4 mg Intravenous Given 09/12/21 2225)    ED Course/ Medical Decision Making/ A&P                           Medical Decision Making Amount and/or Complexity of Data Reviewed Labs: ordered. Radiology: ordered.  Risk Prescription drug management.  Patient presents with abdominal numbness.  Upper abdomen on the left.  Potentially could be related to the trauma from the tube drainage on his chest wall.  Lab work reassuring and near baseline.  Hemoglobin 11.  However patient became more tender.  X-rays have been done of the chest abdomen and chest x-ray was independently interpreted and stable from prior.  Also abdominal x-ray showed no definite abnormality although does have some right lower quadrant stool.  On reexamination however more tender.  Will get CT scan to further evaluate and care of patient over to Dr. Roxanne Mins.  I reviewed recent discharge note        Final Clinical Impression(s) / ED Diagnoses Final diagnoses:  None  Rx / DC Orders ED Discharge Orders     None         Davonna Belling, MD 09/12/21 2330

## 2021-09-13 MED ORDER — OXYCODONE-ACETAMINOPHEN 5-325 MG PO TABS
1.0000 | ORAL_TABLET | Freq: Once | ORAL | Status: AC
  Administered 2021-09-13: 1 via ORAL
  Filled 2021-09-13: qty 1

## 2021-09-13 MED ORDER — MORPHINE SULFATE (PF) 4 MG/ML IV SOLN
4.0000 mg | Freq: Once | INTRAVENOUS | Status: AC
  Administered 2021-09-13: 4 mg via INTRAVENOUS
  Filled 2021-09-13: qty 1

## 2021-09-13 MED ORDER — OXYCODONE HCL 5 MG PO TABS: 5.0000 mg | ORAL_TABLET | ORAL | 0 refills | Status: DC | PRN

## 2021-09-13 NOTE — ED Provider Notes (Addendum)
Care assumed from Dr. Alvino Chapel, patient with malignant pleural effusion and presents with abdominal pain pending CT abdomen and pelvis.  CT scan shows significant amount of fecal retention, fractures of eighth and ninth ribs on the left.  I have independently viewed the images, and agree with radiologist's interpretation.  Rib fractures are likely causing the patient's pain.  He is clearly very tender over the left lateral rib cage.  Constipation is also playing a role in his pain.  I offered a dose of a laxative here, but he refused.  He states he is in a rehab facility and he is not getting adequate care especially regarding hygiene following bowel movements.  He is requesting a hospital admission.  I have advised him that there is not a medical indication for hospitalization, he would need to return to his rehab facility but is welcome to return for reevaluation if there are any changes.  He had a prescription for oxycodone on discharge, and given him a new prescription for oxycodone.  Encouraged good bowel regimen.  Results for orders placed or performed during the hospital encounter of 09/12/21  Comprehensive metabolic panel  Result Value Ref Range   Sodium 136 135 - 145 mmol/L   Potassium 4.1 3.5 - 5.1 mmol/L   Chloride 101 98 - 111 mmol/L   CO2 27 22 - 32 mmol/L   Glucose, Bld 107 (H) 70 - 99 mg/dL   BUN 14 8 - 23 mg/dL   Creatinine, Ser 0.98 0.61 - 1.24 mg/dL   Calcium 8.7 (L) 8.9 - 10.3 mg/dL   Total Protein 6.7 6.5 - 8.1 g/dL   Albumin 3.6 3.5 - 5.0 g/dL   AST 15 15 - 41 U/L   ALT 12 0 - 44 U/L   Alkaline Phosphatase 79 38 - 126 U/L   Total Bilirubin 0.3 0.3 - 1.2 mg/dL   GFR, Estimated >60 >60 mL/min   Anion gap 8 5 - 15  CBC  Result Value Ref Range   WBC 10.3 4.0 - 10.5 K/uL   RBC 3.96 (L) 4.22 - 5.81 MIL/uL   Hemoglobin 11.0 (L) 13.0 - 17.0 g/dL   HCT 34.4 (L) 39.0 - 52.0 %   MCV 86.9 80.0 - 100.0 fL   MCH 27.8 26.0 - 34.0 pg   MCHC 32.0 30.0 - 36.0 g/dL   RDW 15.0 11.5  - 15.5 %   Platelets 421 (H) 150 - 400 K/uL   nRBC 0.0 0.0 - 0.2 %   CT ABDOMEN PELVIS W CONTRAST  Result Date: 09/13/2021 CLINICAL DATA:  Left lower quadrant abdominal pain EXAM: CT ABDOMEN AND PELVIS WITH CONTRAST TECHNIQUE: Multidetector CT imaging of the abdomen and pelvis was performed using the standard protocol following bolus administration of intravenous contrast. RADIATION DOSE REDUCTION: This exam was performed according to the departmental dose-optimization program which includes automated exposure control, adjustment of the mA and/or kV according to patient size and/or use of iterative reconstruction technique. CONTRAST:  128mL OMNIPAQUE IOHEXOL 300 MG/ML  SOLN COMPARISON:  12/31/2020 FINDINGS: Lower chest: Small left partially loculated hydropneumothorax is partially visualized. There is superimposed left-sided volume loss, incompletely assessed on this examination. There is, additionally, superimposed consolidation within the peripheral left lower lobe, possibly infectious or inflammatory in nature. Bullous emphysema again noted at the right lung base. Extensive coronary artery calcification. Global cardiac size within normal limits. Hepatobiliary: Stable cyst within the right hepatic lobe. Liver otherwise unremarkable. Gallbladder unremarkable. No intra or extrahepatic biliary ductal dilation. Pancreas: Unremarkable  Spleen: Unremarkable Adrenals/Urinary Tract: The adrenal glands are unremarkable. The kidneys are normal in size and position. Exophytic simple cortical cysts are seen bilaterally. No follow-up imaging is recommended for these lesions. The kidneys are otherwise unremarkable. Bladder is unremarkable. Stomach/Bowel: Moderate descending and sigmoid colonic diverticulosis without superimposed acute inflammatory change. Moderate colonic stool burden without evidence of obstruction. The stomach, small bowel, and large bowel are otherwise unremarkable. Appendix normal. No free  intraperitoneal gas or fluid. Vascular/Lymphatic: Aortic atherosclerosis. No enlarged abdominal or pelvic lymph nodes. Reproductive: Mild prostatic hypertrophy. Other: Tiny fat containing umbilical and left inguinal hernias. Musculoskeletal: There are acute, minimally displaced fracture of the left eighth and ninth ribs laterally. No lytic or blastic bone lesion. Degenerative changes are seen within the lumbar spine. IMPRESSION: 1. Acute, minimally displaced fractures of the left eighth and ninth ribs laterally. 2. Small left partially loculated hydropneumothorax. 3. Left lower lobe consolidation, possibly infectious or inflammatory in nature. 4. Extensive coronary artery calcification. 5. Moderate distal colonic diverticulosis without superimposed acute inflammatory change. 6. Moderate colonic stool burden without evidence of obstruction. Aortic Atherosclerosis (ICD10-I70.0). Electronically Signed   By: Fidela Salisbury M.D.   On: 09/13/2021 00:43   DG Abd 2 Views  Result Date: 09/12/2021 CLINICAL DATA:  Left chest tube removal. EXAM: ABDOMEN - 2 VIEW COMPARISON:  May 19, 2021 FINDINGS: Fluid and opacity remains in the left base. Left rib fractures are noted. No free air, portal venous gas, or pneumatosis. Mild fecal loading in the colon. No bowel obstruction. IMPRESSION: 1. Pleural fluid and opacity are again identified in the left base. Left-sided rib fractures noted. 2. No acute abnormalities in the abdomen. Electronically Signed   By: Dorise Bullion III M.D.   On: 09/12/2021 22:03   DG Chest 2 View  Result Date: 09/12/2021 CLINICAL DATA:  Left chest tube removed from recent lung fluid drainage. Patient with stage IV lung cancer. EXAM: CHEST - 2 VIEW COMPARISON:  July 5th 2023 FINDINGS: Stable opacity in the right base. Stable effusion and opacity in the left base. No pneumothorax. The cardiomediastinal silhouette is stable. No other interval changes. IMPRESSION: Stable opacity in the right base. Stable  opacity in left base with associated effusion. No interval changes. Electronically Signed   By: Dorise Bullion III M.D.   On: 09/12/2021 22:02   DG Chest 1 View  Result Date: 09/01/2021 CLINICAL DATA:  Status post left PleurX catheter removal. EXAM: CHEST  1 VIEW COMPARISON:  Chest x-ray dated August 28, 2021. FINDINGS: Interval removal of the left-sided PleurX catheter. No pneumothorax. Unchanged small loculated left pleural effusion and left base bulla with air-fluid level. Unchanged left basilar airspace disease and/or atelectasis. The lungs remain hyperinflated with chronically coarsened interstitial markings. Stable cardiomediastinal silhouette with normal heart size. IMPRESSION: 1. Interval left PleurX catheter removal. No pneumothorax. 2. Otherwise unchanged exam. Electronically Signed   By: Titus Dubin M.D.   On: 09/01/2021 15:50   IR Removal Of Plural Cath W/Cuff  Result Date: 09/01/2021 INDICATION: Recurrent malignant left pleural effusion with previous placement of tunneled pleural catheter. Catheter no longer functioning. Patient requesting removal. It was initially placed by Dr. Annamaria Boots on August 04, 2021. EXAM: REMOVAL OF TUNNELED PLEURAL CATHETER MEDICATIONS: 1% lidocaine 8 mL COMPLICATIONS: None immediate. PROCEDURE: Informed written consent was obtained from the patient following an explanation of the procedure, risks, benefits and alternatives to treatment. A time out was performed prior to the initiation of the procedure. Maximal barrier sterile technique was utilized including  caps, mask, sterile gloves, large sterile drape, and hand hygiene. ChloraPrep was used to prep the patient's left lateral chest and existing catheter. The cuff was palpated about 2 inches above the exit site of the catheter. 1% lidocaine was injected around the cuff of the catheter . Using a #15 blade, a small incision was made over the cuff. The catheter was dissected out using scissors and curved hemostats until the  cuff was freed from the surrounding fibrous sheath and the catheter was removed without difficulty. Next a 3-0 Monocryl suture was used to approximate on the skin only at the incision site. Clean dry dressing was placed over the sites. The patient tolerated the procedure well without immediate post procedural complication. IMPRESSION: Successful removal of tunneled pleural catheter. Procedure performed by: Gareth Eagle, PA-C Electronically Signed   By: Michaelle Birks M.D.   On: 09/01/2021 10:52   DG Chest 2 View  Result Date: 08/28/2021 CLINICAL DATA:  Followup pleural effusion. EXAM: CHEST - 2 VIEW COMPARISON:  08/23/2021. FINDINGS: Left lung base opacity consistent with the small effusion and associated parenchymal opacity noted on the previous CT scan. Within this there is a rounded area of air density with a dependent fluid level consistent with the localized cyst/cavity noted on the prior CT scan. A chest tube extends from the lateral lower hemithorax along the left heart border, tip along the anterior medial left pleural margin, stable from the prior CT. Right lung is hyperexpanded, but clear. Both lungs show thickened interstitial markings, chronic. No pneumothorax. IMPRESSION: 1. Findings are without significant change compared to the recent prior chest CT. Stable left chest tube. 2. Small left effusion with associated lung base opacity consistent with atelectasis, infection or a combination. 3. Stable air and fluid containing cavity at the left lung base, suspected to be an infected bullae. Bilateral lung base bullae noted on older CT scans of the chest. 4. Underlying emphysema and chronic interstitial thickening. Electronically Signed   By: Lajean Manes M.D.   On: 08/28/2021 14:44   DG Lumbar Spine 2-3 Views  Result Date: 08/26/2021 CLINICAL DATA:  Injury. EXAM: LUMBAR SPINE - 2-3 VIEW COMPARISON:  Lumbar spine x-ray 07/22/2020 FINDINGS: There is no evidence of lumbar spine fracture. Alignment is  normal. Degenerative disc space changes appear similar to the prior examination with endplate osteophytes and sclerosis. This is most significant at L4-L5, unchanged. IMPRESSION: 1. No acute fracture or malalignment. 2. Stable degenerative changes. Electronically Signed   By: Ronney Asters M.D.   On: 08/26/2021 18:08   CT Angio Chest Pulmonary Embolism (PE) W or WO Contrast  Result Date: 08/23/2021 CLINICAL DATA:  Shortness of breath. EXAM: CT ANGIOGRAPHY CHEST WITH CONTRAST TECHNIQUE: Multidetector CT imaging of the chest was performed using the standard protocol during bolus administration of intravenous contrast. Multiplanar CT image reconstructions and MIPs were obtained to evaluate the vascular anatomy. RADIATION DOSE REDUCTION: This exam was performed according to the departmental dose-optimization program which includes automated exposure control, adjustment of the mA and/or kV according to patient size and/or use of iterative reconstruction technique. CONTRAST:  2mL OMNIPAQUE IOHEXOL 350 MG/ML SOLN COMPARISON:  CT angiogram chest 08/03/2021 FINDINGS: Cardiovascular: Satisfactory opacification of the pulmonary arteries to the segmental level. No evidence of pulmonary embolism. Normal heart size. No pericardial effusion. Mediastinum/Nodes: No enlarged mediastinal, hilar, or axillary lymph nodes. Thyroid gland, trachea, and esophagus demonstrate no significant findings. Lungs/Pleura: There is a partially loculated left pleural effusion which has significantly decreased in size. New  percutaneous pleural catheter is seen in the left lower hemithorax. Severe emphysematous changes are again seen including bullae in the lower lobes. There is a new air-fluid level in a bulla in the left lower lobe with overall measurements of 5.3 x 4.8 cm compatible with infection. There is patchy airspace consolidation in the left lower lobe compatible with infection. There are minimal ground-glass opacities in the lingula, new  from prior. There are few ill-defined nodular densities in the left upper lobe measuring up to 6 mm image 7/44. There is no evidence for pneumothorax. Trachea and central airways appear patent. Upper Abdomen: No acute abnormality. Musculoskeletal: Left lateral seventh rib fracture is minimally displaced in similar to the prior examination. There is a new nondisplaced fracture of the left lateral eighth rib. Degenerative changes affect the spine. Review of the MIP images confirms the above findings. IMPRESSION: 1. No evidence for pulmonary embolism. 2. New/acute left eighth rib fracture. Stable left seventh rib fracture. 3. Loculated left pleural effusion has decreased in size. New pleural drainage catheter in place. 4. There is left lower lobe airspace consolidation and ground-glass opacities in the lingula compatible with infection. 5. There is an air-fluid level within the left lower lobe bulla compatible with infection. Emphysema (ICD10-J43.9). Electronically Signed   By: Ronney Asters M.D.   On: 08/23/2021 23:17   DG Chest 1 View  Result Date: 08/23/2021 CLINICAL DATA:  Shortness of breath EXAM: CHEST  1 VIEW COMPARISON:  08/21/2021 FINDINGS: Stable cardiomediastinal contours. Low lung volumes. Persistent left basilar opacity with small left pleural effusion. Air-filled left basilar bulla/cavity is grossly unchanged. Improving aeration of the right lung base. No pneumothorax. IMPRESSION: Persistent left basilar opacity and small left pleural effusion. Improving aeration of the right lung base. Electronically Signed   By: Davina Poke D.O.   On: 08/23/2021 19:19   [place] CHEST (PLEURAL EFFUSION)  Result Date: 08/22/2021 CLINICAL DATA:  Evaluate left pleural effusion. EXAM: CHEST ULTRASOUND COMPARISON:  Chest radiograph 08/21/2021 FINDINGS: Targeted ultrasound of the left pleural space was performed demonstrating a small complex left pleural effusion. IMPRESSION: Small complex left pleural effusion.  Electronically Signed   By: Kerby Moors M.D.   On: 08/22/2021 14:22   DG Chest Port 1 View  Result Date: 08/21/2021 CLINICAL DATA:  Shortness of breath EXAM: PORTABLE CHEST 1 VIEW COMPARISON:  August 20, 2021 FINDINGS: Stable opacity in the right base. Effusion and underlying opacity on the left is mildly more prominent the interval. No pneumothorax. The cardiomediastinal silhouette is stable. No other interval changes. IMPRESSION: 1. Stable opacity in the right base could represent atelectasis given its platelike appearance. Infiltrate not excluded. Recommend attention on follow-up. 2. Effusion and underlying opacity in the left base is mildly more prominent in the interval. Recommend continued attention on follow-up. 3. No other interval changes. Electronically Signed   By: Dorise Bullion III M.D.   On: 08/21/2021 08:32   US RENAL  Result Date: 08/21/2021 CLINICAL DATA:  Acute renal injury EXAM: RENAL / URINARY TRACT ULTRASOUND COMPLETE COMPARISON:  None Available. FINDINGS: Right Kidney: Renal measurements: 10.3 x 5.8 x 5.9 cm. = volume: 185 mL. 2.2 cm cyst is noted within the right kidney in the upper pole. No follow-up is recommended. No mass or hydronephrosis is noted. Left Kidney: Renal measurements: 12.3 x 6.5 x 6.7 cm. = volume: 281. mL. Echogenicity within normal limits. No mass or hydronephrosis visualized. Bladder: Appears normal for degree of bladder distention. Other: None. IMPRESSION: Right renal cyst.  No follow-up is recommended. No other focal abnormality is noted. Electronically Signed   By: Inez Catalina M.D.   On: 08/21/2021 01:11   CT Cervical Spine Wo Contrast  Result Date: 08/20/2021 CLINICAL DATA:  Trauma.  Fall. EXAM: CT CERVICAL SPINE WITHOUT CONTRAST TECHNIQUE: Multidetector CT imaging of the cervical spine was performed without intravenous contrast. Multiplanar CT image reconstructions were also generated. RADIATION DOSE REDUCTION: This exam was performed according to the  departmental dose-optimization program which includes automated exposure control, adjustment of the mA and/or kV according to patient size and/or use of iterative reconstruction technique. COMPARISON:  None Available. FINDINGS: Alignment: Normal. Skull base and vertebrae: Mildly technically limited secondary to motion artifact. No acute fracture. No primary bone lesion or focal pathologic process. Soft tissues and spinal canal: No prevertebral fluid or swelling. No visible canal hematoma. Disc levels: No significant central canal or neural foraminal stenosis at any level. Upper chest: There are emphysematous changes in the lung apices. Other: None. IMPRESSION: Limited by motion artifact. No acute fracture or traumatic subluxation of the cervical spine. Electronically Signed   By: Ronney Asters M.D.   On: 08/20/2021 19:57   CT Head Wo Contrast  Result Date: 08/20/2021 CLINICAL DATA:  Trauma, fall EXAM: CT HEAD WITHOUT CONTRAST TECHNIQUE: Contiguous axial images were obtained from the base of the skull through the vertex without intravenous contrast. RADIATION DOSE REDUCTION: This exam was performed according to the departmental dose-optimization program which includes automated exposure control, adjustment of the mA and/or kV according to patient size and/or use of iterative reconstruction technique. COMPARISON:  06/25/2021 FINDINGS: Brain: No acute intracranial findings are seen in noncontrast CT brain. There are no signs of bleeding within the cranium. Cortical sulci are prominent. There is decreased density in the periventricular white matter. Vascular: Unremarkable. Skull: No fracture is seen in the calvarium. Sinuses/Orbits: There are no air-fluid levels in the paranasal sinuses. Other: No significant interval changes are noted. IMPRESSION: No acute intracranial findings are seen in noncontrast CT brain. Atrophy. Small-vessel disease. Electronically Signed   By: Elmer Picker M.D.   On: 08/20/2021  19:56   DG Chest Port 1 View  Result Date: 08/20/2021 CLINICAL DATA:  [place]. EXAM: PORTABLE CHEST 1 VIEW COMPARISON:  08/19/2021 FINDINGS: The heart is within normal limits in size and stable. There is a left-sided chest tube in place. Bibasilar atelectasis and scarring. Underlying emphysema. No definite new/acute process. IMPRESSION: 1. Chronic left basilar process with left-sided chest tube in place. 2. No new/acute findings. Electronically Signed   By: Marijo Sanes M.D.   On: 08/20/2021 18:42   DG Pelvis Portable  Result Date: 08/20/2021 CLINICAL DATA:  Fall. EXAM: PORTABLE PELVIS 1-2 VIEWS COMPARISON:  None Available. FINDINGS: There is no evidence of pelvic fracture or diastasis. No pelvic bone lesions are seen. IMPRESSION: Negative. Electronically Signed   By: Rolm Baptise M.D.   On: 08/20/2021 18:40   DG Chest 2 View  Result Date: 08/19/2021 CLINICAL DATA:  Shortness of breath EXAM: CHEST - 2 VIEW COMPARISON:  08/07/2021, chest CT 08/03/2021, radiograph 08/03/2021, 07/04/2021, 01/03/2020 FINDINGS: Left-sided chest drainage catheter with tip anteriorly. Residual left pleural effusion and heterogeneous airspace disease at the left base. Heterogeneous airspace disease at the right base. Cystic abnormality at the left base corresponding to bulla, without change. Stable cardiomediastinal silhouette IMPRESSION: 1. Not much interval change in radiographic appearance of the chest since 08/07/2021. Residual left effusion and left greater than right basilar airspace disease Electronically Signed  By: Donavan Foil M.D.   On: 04/29/4994 92:49       Delora Fuel, MD 32/41/99 0111   Patient was being discharged and he slipped off the bed and fell hitting his back and head.  On exam, there is no obvious external signs of trauma, he is neurologically intact.  No indication for imaging.   Delora Fuel, MD 14/44/58 [phone]

## 2021-09-13 NOTE — Discharge Instructions (Signed)
You can take polyethylene glycol (MiraLAX) as needed for constipation.  You need to work with your doctors at the [facility] regarding pain control.  If the numbness in your abdomen persists, talk with your primary care provider about getting a referral to a neurologist.  Return if your symptoms are getting worse.

## 2021-09-13 NOTE — ED Notes (Signed)
[facility] notified of patient needing transportation back to [facility]

## 2021-09-13 NOTE — ED Notes (Signed)
Report from off going nurse reports patient fell in room 1 and was moved to hallway 1 for patient safety. However there is no note or post fall bundle in the chart. Facility has been updated.

## 2021-09-16 ENCOUNTER — Telehealth: Payer: Self-pay | Admitting: Internal Medicine

## 2021-09-16 NOTE — Telephone Encounter (Signed)
Per 7/20 phone line.  Pt's caretaker called to schedule a hospital f/u appointment.  Appointment made per request

## 2021-09-17 ENCOUNTER — Emergency Department (HOSPITAL_COMMUNITY)
Admission: EM | Admit: 2021-09-17 | Discharge: 2021-09-18 | Disposition: A | Discharge status: Home / Self Care | Payer: No Typology Code available for payment source | Attending: Emergency Medicine | Admitting: Emergency Medicine

## 2021-09-17 ENCOUNTER — Emergency Department (HOSPITAL_COMMUNITY): Payer: No Typology Code available for payment source

## 2021-09-17 ENCOUNTER — Other Ambulatory Visit: Payer: Self-pay

## 2021-09-17 ENCOUNTER — Encounter (HOSPITAL_COMMUNITY): Payer: Self-pay

## 2021-09-17 DIAGNOSIS — M545 Low back pain, unspecified: Secondary | ICD-10-CM | POA: Insufficient documentation

## 2021-09-17 DIAGNOSIS — M79661 Pain in right lower leg: Secondary | ICD-10-CM | POA: Diagnosis present

## 2021-09-17 DIAGNOSIS — R209 Unspecified disturbances of skin sensation: Secondary | ICD-10-CM | POA: Insufficient documentation

## 2021-09-17 DIAGNOSIS — W19XXXA Unspecified fall, initial encounter: Secondary | ICD-10-CM | POA: Diagnosis not present

## 2021-09-17 MED ORDER — OXYCODONE-ACETAMINOPHEN 5-325 MG PO TABS
1.0000 | ORAL_TABLET | Freq: Once | ORAL | Status: AC
  Administered 2021-09-17: 1 via ORAL
  Filled 2021-09-17: qty 1

## 2021-09-17 NOTE — ED Notes (Signed)
[facility] contacted for transport

## 2021-09-17 NOTE — Discharge Instructions (Signed)
Return for any problem.  ?

## 2021-09-17 NOTE — ED Provider Notes (Signed)
[facility] DEPT Provider Note   CSN: [id number] Arrival date & time: 09/17/21  2053     History  Chief Complaint  Patient presents with   Mario [name] is a 71 y.o. male.  71 year old male with prior medical history as detailed below presents for evaluation after reported fall. Patient reports that he was navigating down the hallway.  He turned.  He lost his balance.  He slid down hard onto his rear end.  He was able to hold a wall as he fell.  He did not strike his head.  Did not lose consciousness.  He complains of mild pain to the right chin where he thinks he struck his leg against the wall.  He also complains of mild pain to the area of his tailbone.  Additionally, patient complains of continued "numbness" along the left side of his abdomen.  This has been an ongoing issue.  He has had prior presentations with same complaint.   The history is provided by the patient and medical records.  Fall This is a new problem. The current episode started less than 1 hour ago. The problem occurs every several days. The problem has not changed since onset.Pertinent negatives include no chest pain, no abdominal pain, no headaches and no shortness of breath. Nothing aggravates the symptoms. Nothing relieves the symptoms.       Home Medications Prior to Admission medications   Medication Sig Start Date End Date Taking? Authorizing Provider  albuterol (PROVENTIL) (2.5 MG/3ML) 0.083% nebulizer solution Inhale 3 mLs (2.5 mg total) by nebulization every 4 (four) hours as needed for wheezing or shortness of breath. Patient taking differently: Take 2.5 mg by nebulization every 6 (six) hours as needed for wheezing or shortness of breath. 08/04/20 08/23/21  Lavina Hamman, MD  albuterol (VENTOLIN HFA) 108 (90 Base) MCG/ACT inhaler Inhale 2 puffs into the lungs every 6 (six) hours as needed for wheezing. 06/02/20   [provider]  apixaban (ELIQUIS) 5 MG  TABS tablet Take 5 mg by mouth 2 (two) times daily.    [provider]  b complex vitamins capsule Take 1 capsule by mouth daily.    [provider]  buPROPion (WELLBUTRIN XL) 300 MG 24 hr tablet Take 300 mg by mouth daily. 02/05/20   [provider]  busPIRone (BUSPAR) 15 MG tablet Take 15 mg by mouth daily.    [provider]  calcium-vitamin D (OSCAL WITH D) 500-200 MG-UNIT tablet Take 1 tablet by mouth 2 (two) times daily with a meal.    [provider]  carboxymethylcellulose (REFRESH PLUS) 0.5 % SOLN Place 1 drop into both eyes 4 (four) times daily as needed (dry eyes).    [provider]  Cholecalciferol (VITAMIN D-3) 25 MCG (1000 UT) CAPS Take 1,000 Units by mouth 2 (two) times daily.    [provider]  clobetasol ointment (TEMOVATE) 0.34 % Apply 1 Application topically 2 (two) times daily.    [provider]  clotrimazole-betamethasone (LOTRISONE) cream Apply 1 application. topically 2 (two) times daily as needed (fungus). 07/06/21   Aline August, MD  cycloSPORINE (RESTASIS) 0.05 % ophthalmic emulsion Place 1 drop into both eyes 2 (two) times daily.    [provider]  diltiazem (CARDIZEM CD) 180 MG 24 hr capsule Take 180 mg by mouth daily.    [provider]  DULoxetine (CYMBALTA) 60 MG capsule Take 1 capsule (60 mg total) by mouth 2 (  two) times daily. 07/21/17   Lavina Hamman, MD  feeding supplement (ENSURE ENLIVE / ENSURE PLUS) LIQD Take 237 mLs by mouth 3 (three) times daily between meals. Patient taking differently: Take 1 Bottle by mouth daily. 08/04/20   Lavina Hamman, MD  fluticasone Asencion Islam) 50 MCG/ACT nasal spray Place 2 sprays into both nostrils daily.     [provider]  folic acid (FOLVITE) 1 MG tablet Take 1 mg by mouth daily.    [provider]  gabapentin (NEURONTIN) 300 MG capsule Take 1 capsule (300 mg total) by mouth 2 (two) times daily. 07/06/21   Aline August,  MD  GARLIC OIL PO Take 1 capsule by mouth daily.    [provider]  guaiFENesin (MUCINEX) 600 MG 12 hr tablet Take 1 tablet (600 mg total) by mouth 2 (two) times daily as needed for cough. 07/06/21   Aline August, MD  lidocaine (LIDODERM) 5 % Place 2 patches onto the skin daily. Remove & Discard patch within 12 hours or as directed by MD 09/06/21   Jonetta Osgood, MD  lurasidone (LATUDA) 40 MG TABS tablet Take 40 mg by mouth daily after supper.    [provider]  Melatonin 3 MG TABS Take 6 mg by mouth at bedtime.    [provider]  Mometasone Furoate 200 MCG/ACT AERO Inhale 2 puffs into the lungs at bedtime.    [provider]  mycophenolate (CELLCEPT) 250 MG capsule Take 1,500 mg by mouth 2 (two) times daily.    [provider]  nitroGLYCERIN (NITROSTAT) 0.4 MG SL tablet Place 1 tablet (0.4 mg total) under the tongue every 5 (five) minutes as needed for chest pain. Patient not taking: Reported on 08/20/2021 07/03/19   Edwin Dada, MD  Olodaterol HCl (STRIVERDI RESPIMAT) 2.5 MCG/ACT AERS Inhale 2 puffs into the lungs daily.    [provider]  oxcarbazepine (TRILEPTAL) 600 MG tablet Take 600-900 mg by mouth See admin instructions. Take 600mg  by mouth during the day, then take  900 mg by mouth at bedtime 07/16/20   [provider]  oxyCODONE (OXY IR/ROXICODONE) 5 MG immediate release tablet Take 1-2 tablets (5-10 mg total) by mouth every 4 (four) hours as needed for severe pain. 9/83/38   Delora Fuel, MD  oxyCODONE (OXYCONTIN) 20 mg 12 hr tablet Take 1 tablet (20 mg total) by mouth every 12 (twelve) hours. 09/06/21   Ghimire, Henreitta Leber, MD  OXYGEN Inhale 2 L into the lungs continuous.    [provider]  polyethylene glycol (MIRALAX / GLYCOLAX) 17 g packet Take 17 g by mouth daily.    [provider]  senna-docusate (SENOKOT-S) 8.6-50 MG tablet Take 1 tablet by mouth 2 (two) times daily. 07/06/21   Aline August, MD  sodium chloride (OCEAN) 0.65 % SOLN nasal spray Place 2 sprays into both nostrils 4 (four) times daily as needed for congestion.    [provider]  tamsulosin (FLOMAX) 0.4 MG CAPS capsule Take 0.4 mg by mouth daily.    [provider]  traZODone (DESYREL) 50 MG tablet Take 75 mg by mouth at bedtime.    [provider]  vitamin C (ASCORBIC ACID) 500 MG tablet Take 500 mg by mouth 2 (two) times daily.    [provider]      Allergies    Demerol [meperidine], Zocor [simvastatin], Beet [beta vulgaris], and Liver    Review of Systems   Review of Systems  Respiratory:  Negative for shortness of breath.   Cardiovascular:  Negative for chest pain.  Gastrointestinal:  Negative for abdominal pain.  Neurological:  Negative for headaches.  All other systems reviewed and are negative.   Physical Exam Updated Vital Signs BP (!) 148/121 (BP Location: Left Arm)   Pulse (!) 113   Temp 98.2 F (36.8 C)   Resp 16   Ht 6\' 2"  (1.88 m)   Wt (!) 138.3 kg   SpO2 94%   BMI 39.16 kg/m  Physical Exam Vitals and nursing note reviewed.  Constitutional:      General: He is not in acute distress.    Appearance: Normal appearance. He is well-developed.  HENT:     Head: Normocephalic and atraumatic.  Eyes:     Conjunctiva/sclera: Conjunctivae normal.     Pupils: Pupils are equal, round, and reactive to light.  Cardiovascular:     Rate and Rhythm: Normal rate and regular rhythm.     Heart sounds: Normal heart sounds.  Pulmonary:     Effort: Pulmonary effort is normal. No respiratory distress.     Breath sounds: Normal breath sounds.  Abdominal:     General: There is no distension.     Palpations: Abdomen is soft.     Tenderness: There is no abdominal tenderness.  Musculoskeletal:        General: No deformity. Normal range of motion.     Cervical back: Normal range of motion and neck supple.     Comments: Mild tenderness elicited with palpation of  the anterior right shin.  No step-off or instability noted.  Skin:    General: Skin is warm and dry.  Neurological:     General: No focal deficit present.     Mental Status: He is alert and oriented to person, place, and time.     ED Results / Procedures / Treatments   Labs (all labs ordered are listed, but only abnormal results are displayed) Labs Reviewed - No data to display  EKG None  Radiology No results found.  Procedures Procedures    Medications Ordered in ED Medications - No data to display  ED Course/ Medical Decision Making/ A&P                           Medical Decision Making Amount and/or Complexity of Data Reviewed Radiology: ordered.  Risk Prescription drug management.    Medical Screen Complete  This patient presented to the ED with complaint of fall.  This complaint involves an extensive number of treatment options. The initial differential diagnosis includes, but is not limited to, injury related to fall  This presentation is: Acute, Self-Limited, Previously Undiagnosed, Uncertain Prognosis, Complicated, Systemic Symptoms, and Threat to Life/Bodily Function  Patient presents after reported low-energy ground-level mechanical fall.    Patient without acute traumatic injury on exam.Imaging obtained is without significant acute abnormality.  Patient reassured by this evaluation Patient is appropriate for discharge  Importance of close follow-up is stressed.  Strict and precautions given and understood.   Additional history obtained:  External records from outside sources obtained and reviewed including prior ED visits and prior Inpatient records.    Imaging Studies ordered:  I ordered imaging studies including plain films of the pelvis, chest, the right tib-fib I independently visualized and interpreted obtained imaging which showed NAD I agree with the radiologist interpretation.   Cardiac Monitoring:  The patient was maintained  on a cardiac  monitor.  I personally viewed and interpreted the cardiac monitor which showed an underlying rhythm of: NSR   Medicines ordered:  I ordered medication including percocet  for pain  Reevaluation of the patient after these medicines showed that the patient: improved   Problem List / ED Course:  Fall   Reevaluation:  After the interventions noted above, I reevaluated the patient and found that they have: improved  Disposition:  After consideration of the diagnostic results and the patients response to treatment, I feel that the patent would benefit from close outpatient followup.          Final Clinical Impression(s) / ED Diagnoses Final diagnoses:  Fall, initial encounter    Rx / DC Orders ED Discharge Orders     None         Valarie Merino, MD 09/17/21 2302

## 2021-09-17 NOTE — ED Triage Notes (Signed)
Pt had a mechanical fall at home from a loss of balance. Pt c/o bilateral hip pain and right lower left pain.

## 2021-09-18 NOTE — ED Notes (Addendum)
RN called [facility] for update. [facility] stated pt is fourth in line.

## 2021-09-22 ENCOUNTER — Encounter (HOSPITAL_COMMUNITY): Payer: Self-pay | Admitting: Emergency Medicine

## 2021-09-22 ENCOUNTER — Inpatient Hospital Stay (HOSPITAL_COMMUNITY)
Admission: EM | Admit: 2021-09-22 | Discharge: 2021-09-26 | DRG: 180 | Disposition: A | Discharge status: Home Health | Payer: No Typology Code available for payment source | Attending: Internal Medicine | Admitting: Internal Medicine

## 2021-09-22 ENCOUNTER — Emergency Department (HOSPITAL_COMMUNITY): Payer: No Typology Code available for payment source

## 2021-09-22 DIAGNOSIS — R0602 Shortness of breath: Secondary | ICD-10-CM

## 2021-09-22 DIAGNOSIS — S2242XA Multiple fractures of ribs, left side, initial encounter for closed fracture: Secondary | ICD-10-CM | POA: Diagnosis present

## 2021-09-22 DIAGNOSIS — F419 Anxiety disorder, unspecified: Secondary | ICD-10-CM | POA: Diagnosis present

## 2021-09-22 DIAGNOSIS — W19XXXA Unspecified fall, initial encounter: Secondary | ICD-10-CM | POA: Diagnosis present

## 2021-09-22 DIAGNOSIS — Z91014 Allergy to mammalian meats: Secondary | ICD-10-CM

## 2021-09-22 DIAGNOSIS — Z79620 Long term (current) use of immunosuppressive biologic: Secondary | ICD-10-CM

## 2021-09-22 DIAGNOSIS — Z888 Allergy status to other drugs, medicaments and biological substances status: Secondary | ICD-10-CM

## 2021-09-22 DIAGNOSIS — S2242XS Multiple fractures of ribs, left side, sequela: Secondary | ICD-10-CM

## 2021-09-22 DIAGNOSIS — F339 Major depressive disorder, recurrent, unspecified: Secondary | ICD-10-CM | POA: Diagnosis present

## 2021-09-22 DIAGNOSIS — Y92009 Unspecified place in unspecified non-institutional (private) residence as the place of occurrence of the external cause: Secondary | ICD-10-CM

## 2021-09-22 DIAGNOSIS — I501 Left ventricular failure: Secondary | ICD-10-CM | POA: Diagnosis present

## 2021-09-22 DIAGNOSIS — R531 Weakness: Secondary | ICD-10-CM

## 2021-09-22 DIAGNOSIS — Z885 Allergy status to narcotic agent status: Secondary | ICD-10-CM

## 2021-09-22 DIAGNOSIS — C3492 Malignant neoplasm of unspecified part of left bronchus or lung: Secondary | ICD-10-CM | POA: Diagnosis not present

## 2021-09-22 DIAGNOSIS — R45851 Suicidal ideations: Secondary | ICD-10-CM

## 2021-09-22 DIAGNOSIS — Z86711 Personal history of pulmonary embolism: Secondary | ICD-10-CM | POA: Diagnosis present

## 2021-09-22 DIAGNOSIS — G4733 Obstructive sleep apnea (adult) (pediatric): Secondary | ICD-10-CM | POA: Diagnosis present

## 2021-09-22 DIAGNOSIS — Z7901 Long term (current) use of anticoagulants: Secondary | ICD-10-CM

## 2021-09-22 DIAGNOSIS — Z87891 Personal history of nicotine dependence: Secondary | ICD-10-CM

## 2021-09-22 DIAGNOSIS — Z923 Personal history of irradiation: Secondary | ICD-10-CM

## 2021-09-22 DIAGNOSIS — G894 Chronic pain syndrome: Secondary | ICD-10-CM | POA: Diagnosis present

## 2021-09-22 DIAGNOSIS — J9621 Acute and chronic respiratory failure with hypoxia: Secondary | ICD-10-CM | POA: Diagnosis present

## 2021-09-22 DIAGNOSIS — Z8701 Personal history of pneumonia (recurrent): Secondary | ICD-10-CM

## 2021-09-22 DIAGNOSIS — Z66 Do not resuscitate: Secondary | ICD-10-CM | POA: Diagnosis present

## 2021-09-22 DIAGNOSIS — Z91199 Patient's noncompliance with other medical treatment and regimen due to unspecified reason: Secondary | ICD-10-CM

## 2021-09-22 DIAGNOSIS — Z91018 Allergy to other foods: Secondary | ICD-10-CM

## 2021-09-22 DIAGNOSIS — C799 Secondary malignant neoplasm of unspecified site: Secondary | ICD-10-CM | POA: Diagnosis present

## 2021-09-22 DIAGNOSIS — D649 Anemia, unspecified: Secondary | ICD-10-CM | POA: Diagnosis present

## 2021-09-22 DIAGNOSIS — J810 Acute pulmonary edema: Secondary | ICD-10-CM | POA: Diagnosis present

## 2021-09-22 DIAGNOSIS — M459 Ankylosing spondylitis of unspecified sites in spine: Secondary | ICD-10-CM | POA: Diagnosis present

## 2021-09-22 DIAGNOSIS — Z79899 Other long term (current) drug therapy: Secondary | ICD-10-CM

## 2021-09-22 DIAGNOSIS — Z9981 Dependence on supplemental oxygen: Secondary | ICD-10-CM | POA: Diagnosis present

## 2021-09-22 DIAGNOSIS — C3490 Malignant neoplasm of unspecified part of unspecified bronchus or lung: Secondary | ICD-10-CM

## 2021-09-22 DIAGNOSIS — I252 Old myocardial infarction: Secondary | ICD-10-CM

## 2021-09-22 DIAGNOSIS — I776 Arteritis, unspecified: Secondary | ICD-10-CM | POA: Diagnosis present

## 2021-09-22 DIAGNOSIS — E119 Type 2 diabetes mellitus without complications: Secondary | ICD-10-CM | POA: Diagnosis present

## 2021-09-22 DIAGNOSIS — N400 Enlarged prostate without lower urinary tract symptoms: Secondary | ICD-10-CM | POA: Diagnosis present

## 2021-09-22 DIAGNOSIS — J9611 Chronic respiratory failure with hypoxia: Secondary | ICD-10-CM | POA: Diagnosis present

## 2021-09-22 DIAGNOSIS — F32A Depression, unspecified: Secondary | ICD-10-CM

## 2021-09-22 DIAGNOSIS — J441 Chronic obstructive pulmonary disease with (acute) exacerbation: Secondary | ICD-10-CM | POA: Diagnosis present

## 2021-09-22 DIAGNOSIS — Z86718 Personal history of other venous thrombosis and embolism: Secondary | ICD-10-CM

## 2021-09-22 DIAGNOSIS — I10 Essential (primary) hypertension: Secondary | ICD-10-CM | POA: Diagnosis present

## 2021-09-22 LAB — COMPREHENSIVE METABOLIC PANEL
ALT: 13 U/L (ref 0–44)
AST: 23 U/L (ref 15–41)
Albumin: 3.4 g/dL — ABNORMAL LOW (ref 3.5–5.0)
Alkaline Phosphatase: 83 U/L (ref 38–126)
Anion gap: 9 (ref 5–15)
BUN: 11 mg/dL (ref 8–23)
CO2: 23 mmol/L (ref 22–32)
Calcium: 9.1 mg/dL (ref 8.9–10.3)
Chloride: 103 mmol/L (ref 98–111)
Creatinine, Ser: 0.93 mg/dL (ref 0.61–1.24)
GFR, Estimated: >60 mL/min (ref >60)
Glucose, Bld: 123 mg/dL — ABNORMAL HIGH (ref 70–99)
Potassium: 4.6 mmol/L (ref 3.5–5.1)
Sodium: 135 mmol/L (ref 135–145)
Total Bilirubin: 0.7 mg/dL (ref 0.3–1.2)
Total Protein: 6.6 g/dL (ref 6.5–8.1)

## 2021-09-22 LAB — CBC WITH DIFFERENTIAL/PLATELET
Abs Immature Granulocytes: 0.05 10*3/uL (ref 0.00–0.07)
Basophils Absolute: 0 10*3/uL (ref 0.0–0.1)
Basophils Relative: 0 %
Eosinophils Absolute: 0 10*3/uL (ref 0.0–0.5)
Eosinophils Relative: 0 %
HCT: 29.5 % — ABNORMAL LOW (ref 39.0–52.0)
Hemoglobin: 8.6 g/dL — ABNORMAL LOW (ref 13.0–17.0)
Immature Granulocytes: 1 %
Lymphocytes Relative: 6 %
Lymphs Abs: 0.5 10*3/uL — ABNORMAL LOW (ref 0.7–4.0)
MCH: 28.7 pg (ref 26.0–34.0)
MCHC: 29.2 g/dL — ABNORMAL LOW (ref 30.0–36.0)
MCV: 98.3 fL (ref 80.0–100.0)
Monocytes Absolute: 0.2 10*3/uL (ref 0.1–1.0)
Monocytes Relative: 3 %
Neutro Abs: 7.4 10*3/uL (ref 1.7–7.7)
Neutrophils Relative %: 90 %
Platelets: 175 10*3/uL (ref 150–400)
RBC: 3 MIL/uL — ABNORMAL LOW (ref 4.22–5.81)
RDW: 14.6 % (ref 11.5–15.5)
WBC: 8.2 10*3/uL (ref 4.0–10.5)
nRBC: 0 % (ref 0.0–0.2)

## 2021-09-22 LAB — URINALYSIS, ROUTINE W REFLEX MICROSCOPIC
Bilirubin Urine: NEGATIVE
Glucose, UA: NEGATIVE mg/dL
Hgb urine dipstick: NEGATIVE
Ketones, ur: NEGATIVE mg/dL
Leukocytes,Ua: NEGATIVE
Nitrite: NEGATIVE
Protein, ur: NEGATIVE mg/dL
Specific Gravity, Urine: 1.009 (ref 1.005–1.030)
pH: 8 (ref 5.0–8.0)

## 2021-09-22 MED ORDER — OXYCODONE HCL ER 10 MG PO T12A
20.0000 mg | EXTENDED_RELEASE_TABLET | Freq: Two times a day (BID) | ORAL | Status: DC
  Administered 2021-09-22 – 2021-09-26 (×8): 20 mg via ORAL
  Filled 2021-09-22 (×8): qty 2

## 2021-09-22 NOTE — ED Provider Notes (Signed)
[facility] EMERGENCY DEPARTMENT Provider Note   CSN: [id number] Arrival date & time: 09/22/21  1914     History {Add pertinent medical, surgical, social history, OB history to HPI:1} Chief Complaint  Patient presents with   Shortness of Breath   SI    Mario [name] is a 71 y.o. male.  Pt reports increased shortness of breath and depression today. Pt reports he took his eliquis but has not taken his psych medication or his pain medications.  Pt reports it is hard to keep up with his medications. Pt reports he has Stage 4 lung ca.  Pt is followed by Dr. Earlie Server. Pt is on 3 liters of 02 at home.  Dr. Earlie Server notes report pt has an uncurable cancer.  He has offered palliative chemo vs hospice referral.  Pt has not had follow up with Dr. Earlie Server yet.  Pt complains of increased weakness.  Pt reports suicidal thoughts as well.  Pt admits to not taking his psychiatric medications.    The history is provided by the patient. No language interpreter was used.  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 Authorizing Provider  albuterol (PROVENTIL) (2.5 MG/3ML) 0.083% nebulizer solution Inhale 3 mLs (2.5 mg total) by nebulization every 4 (four) hours as needed for wheezing or shortness of breath. Patient taking differently: Take 2.5 mg by nebulization every 6 (six) hours as needed for wheezing or shortness of breath. 08/04/20 08/23/21  Lavina Hamman, MD  albuterol (VENTOLIN HFA) 108 (90 Base) MCG/ACT inhaler Inhale 2 puffs into the lungs every 6 (six) hours as needed for wheezing. 06/02/20   [provider]  apixaban (ELIQUIS) 5 MG TABS tablet Take 5 mg by mouth 2  (two) times daily.    [provider]  b complex vitamins capsule Take 1 capsule by mouth daily.    [provider]  buPROPion (WELLBUTRIN XL) 300 MG 24 hr tablet Take 300 mg by mouth daily. 02/05/20   [provider]  busPIRone (BUSPAR) 15 MG tablet Take 15 mg by mouth daily.    [provider]  calcium-vitamin D (OSCAL WITH D) 500-200 MG-UNIT tablet Take 1 tablet by mouth 2 (two) times daily with a meal.    [provider]  carboxymethylcellulose (REFRESH PLUS) 0.5 % SOLN Place 1 drop into both eyes 4 (four) times daily as needed (dry eyes).    [provider]  Cholecalciferol (VITAMIN D-3) 25 MCG (1000 UT) CAPS Take 1,000 Units by mouth 2 (two) times daily.    [provider]  clobetasol ointment (TEMOVATE) 3.87 % Apply 1 Application topically 2 (two) times daily.    [provider]  clotrimazole-betamethasone (LOTRISONE) cream Apply 1 application. topically 2 (two) times daily as needed (fungus). 07/06/21   Aline August, MD  cycloSPORINE (RESTASIS) 0.05 % ophthalmic emulsion Place 1 drop into both eyes 2 (two) times daily.    [provider]  diltiazem (CARDIZEM CD) 180 MG 24 hr capsule Take 180 mg by mouth daily.    [provider]  DULoxetine (CYMBALTA) 60 MG capsule Take 1 capsule (60 mg total) by mouth 2 (two) times daily. 07/21/17  Lavina Hamman, MD  feeding supplement (ENSURE ENLIVE / ENSURE PLUS) LIQD Take 237 mLs by mouth 3 (three) times daily between meals. Patient taking differently: Take 1 Bottle by mouth daily. 08/04/20   Lavina Hamman, MD  fluticasone Asencion Islam) 50 MCG/ACT nasal spray Place 2 sprays into both nostrils daily.     [provider]  folic acid (FOLVITE) 1 MG tablet Take 1 mg by mouth daily.    [provider]  gabapentin (NEURONTIN) 300 MG capsule Take 1 capsule (300 mg total) by mouth 2 (two) times daily. 07/06/21   Aline August, MD  GARLIC OIL PO Take 1 capsule  by mouth daily.    [provider]  guaiFENesin (MUCINEX) 600 MG 12 hr tablet Take 1 tablet (600 mg total) by mouth 2 (two) times daily as needed for cough. 07/06/21   Aline August, MD  lidocaine (LIDODERM) 5 % Place 2 patches onto the skin daily. Remove & Discard patch within 12 hours or as directed by MD 09/06/21   Jonetta Osgood, MD  lurasidone (LATUDA) 40 MG TABS tablet Take 40 mg by mouth daily after supper.    [provider]  Melatonin 3 MG TABS Take 6 mg by mouth at bedtime.    [provider]  Mometasone Furoate 200 MCG/ACT AERO Inhale 2 puffs into the lungs at bedtime.    [provider]  mycophenolate (CELLCEPT) 250 MG capsule Take 1,500 mg by mouth 2 (two) times daily.    [provider]  nitroGLYCERIN (NITROSTAT) 0.4 MG SL tablet Place 1 tablet (0.4 mg total) under the tongue every 5 (five) minutes as needed for chest pain. Patient not taking: Reported on 08/20/2021 07/03/19   Edwin Dada, MD  Olodaterol HCl (STRIVERDI RESPIMAT) 2.5 MCG/ACT AERS Inhale 2 puffs into the lungs daily.    [provider]  oxcarbazepine (TRILEPTAL) 600 MG tablet Take 600-900 mg by mouth See admin instructions. Take 600mg  by mouth during the day, then take  900 mg by mouth at bedtime 07/16/20   [provider]  oxyCODONE (OXY IR/ROXICODONE) 5 MG immediate release tablet Take 1-2 tablets (5-10 mg total) by mouth every 4 (four) hours as needed for severe pain. 1/61/09   Delora Fuel, MD  oxyCODONE (OXYCONTIN) 20 mg 12 hr tablet Take 1 tablet (20 mg total) by mouth every 12 (twelve) hours. 09/06/21   Ghimire, Henreitta Leber, MD  OXYGEN Inhale 2 L into the lungs continuous.    [provider]  polyethylene glycol (MIRALAX / GLYCOLAX) 17 g packet Take 17 g by mouth daily.    [provider]  senna-docusate (SENOKOT-S) 8.6-50 MG tablet Take 1 tablet by mouth 2 (two) times daily. 07/06/21   Aline August, MD  sodium chloride (OCEAN)  0.65 % SOLN nasal spray Place 2 sprays into both nostrils 4 (four) times daily as needed for congestion.    [provider]  tamsulosin (FLOMAX) 0.4 MG CAPS capsule Take 0.4 mg by mouth daily.    [provider]  traZODone (DESYREL) 50 MG tablet Take 75 mg by mouth at bedtime.    [provider]  vitamin C (ASCORBIC ACID) 500 MG tablet Take 500 mg by mouth 2 (two) times daily.    [provider]      Allergies    Demerol [meperidine], Zocor [simvastatin], Beet [beta vulgaris], and Liver    Review of Systems   Review of Systems  Constitutional:  Negative for fever.  Respiratory:  Positive for shortness of breath.   Gastrointestinal:  Negative for abdominal pain, anal bleeding, diarrhea and nausea.  All other systems reviewed and are negative.   Physical Exam Updated Vital Signs BP 140/90   Pulse 94   Temp 98.1 F (36.7 C)   Resp 11   Wt (!) 138.3 kg   SpO2 95%   BMI 39.16 kg/m  Physical Exam Vitals and nursing note reviewed.  Constitutional:      Appearance: He is well-developed.  HENT:     Head: Normocephalic.  Cardiovascular:     Rate and Rhythm: Normal rate and regular rhythm.  Pulmonary:     Effort: Pulmonary effort is normal.     Breath sounds: No decreased breath sounds.  Chest:     Chest wall: No tenderness.  Abdominal:     General: Bowel sounds are normal. There is no distension.     Palpations: Abdomen is soft.  Musculoskeletal:        General: Normal range of motion.     Cervical back: Normal range of motion.  Skin:    General: Skin is warm.  Neurological:     General: No focal deficit present.     Mental Status: He is alert and oriented to person, place, and time.  Psychiatric:        Mood and Affect: Mood normal.     ED Results / Procedures / Treatments   Labs (all labs ordered are listed, but only abnormal results are displayed) Labs Reviewed  CBC WITH DIFFERENTIAL/PLATELET - Abnormal; Notable for the  following components:      Result Value   RBC 3.00 (*)    Hemoglobin 8.6 (*)    HCT 29.5 (*)    MCHC 29.2 (*)    Lymphs Abs 0.5 (*)    All other components within normal limits  COMPREHENSIVE METABOLIC PANEL - Abnormal; Notable for the following components:   Glucose, Bld 123 (*)    Albumin 3.4 (*)    All other components within normal limits  URINALYSIS, ROUTINE W REFLEX MICROSCOPIC    EKG EKG Interpretation  Date/Time:  Wednesday September 22 2021 19:15:05 EDT Ventricular Rate:  96 PR Interval:  216 QRS Duration: 119 QT Interval:  357 QTC Calculation: 452 R Axis:   -1 Text Interpretation: Sinus rhythm Ventricular premature complex Prolonged PR interval Nonspecific intraventricular conduction delay No significant change since last tracing Confirmed by Gareth Morgan [phone] on 09/22/2021 9:47:11 PM  Radiology DG Chest Port 1 View  Result Date: 09/22/2021 CLINICAL DATA:  Shortness of breath EXAM: PORTABLE CHEST 1 VIEW COMPARISON:  09/17/2021 FINDINGS: Cardiac enlargement. Emphysematous changes in the lungs with perihilar interstitial changes most prominent on the left. This is likely edema. Small left pleural effusion. Similar appearance to previous study. IMPRESSION: Cardiac enlargement with perihilar infiltrates, greater on the left, and small left pleural effusion. Electronically Signed   By: Lucienne Capers M.D.   On: 09/22/2021 22:01    Procedures Procedures  {Document cardiac monitor, telemetry assessment procedure when appropriate:1}  Medications Ordered in ED Medications  oxyCODONE (OXYCONTIN) 12 hr tablet 20 mg (has no administration in time range)    ED Course/ Medical Decision Making/ A&P                           Medical Decision Making Pt complains of weakness.  Pt reports he is more short of breath    Amount and/or Complexity  of Data Reviewed External Data Reviewed: notes.    Details: Oncology notes reviewed Labs: ordered. Decision-making details  documented in ED Course.    Details: Hemoglobin is 8.6  This is decreased from baseline Radiology: ordered and independent interpretation performed. Decision-making details documented in ED Course.    Details: Chest xray shows perihilar infiltrates and small pleural effusion Discussion of management or test interpretation with external provider(s): Hospitalist consulted  Risk Prescription drug management.     {Document critical care time when appropriate:1} {Document review of labs and clinical decision tools ie heart score, Chads2Vasc2 etc:1}  {Document your independent review of radiology images, and any outside records:1} {Document your discussion with family members, caretakers, and with consultants:1} {Document social determinants of health affecting pt's care:1} {Document your decision making why or why not admission, treatments were needed:1} Final Clinical Impression(s) / ED Diagnoses Final diagnoses:  Anemia, unspecified type  SOB (shortness of breath)  Malignant neoplasm of lung, unspecified laterality, unspecified part of lung ([place])  Weakness  Depression, unspecified depression type    Rx / DC Orders ED Discharge Orders     None

## 2021-09-22 NOTE — ED Triage Notes (Signed)
Pt in with c/o sob, worsened today, hx of Stg 4 Lung CA. Also endorses SI, plan to take pills and end his life. Pt is also reporting tailbone pain, states he had what sounds like a syncopal fall this am. Sats 96% on 4LNC, wears at home

## 2021-09-22 NOTE — ED Notes (Signed)
Sitter from staffing arrived, now present in pt's room

## 2021-09-23 ENCOUNTER — Encounter (HOSPITAL_COMMUNITY): Payer: Self-pay | Admitting: Internal Medicine

## 2021-09-23 ENCOUNTER — Other Ambulatory Visit: Payer: Self-pay

## 2021-09-23 DIAGNOSIS — Z7189 Other specified counseling: Secondary | ICD-10-CM | POA: Diagnosis not present

## 2021-09-23 DIAGNOSIS — D649 Anemia, unspecified: Secondary | ICD-10-CM | POA: Diagnosis present

## 2021-09-23 DIAGNOSIS — J9611 Chronic respiratory failure with hypoxia: Secondary | ICD-10-CM

## 2021-09-23 DIAGNOSIS — R0602 Shortness of breath: Secondary | ICD-10-CM | POA: Diagnosis present

## 2021-09-23 DIAGNOSIS — Z86711 Personal history of pulmonary embolism: Secondary | ICD-10-CM | POA: Diagnosis not present

## 2021-09-23 DIAGNOSIS — Z923 Personal history of irradiation: Secondary | ICD-10-CM | POA: Diagnosis not present

## 2021-09-23 DIAGNOSIS — G4733 Obstructive sleep apnea (adult) (pediatric): Secondary | ICD-10-CM

## 2021-09-23 DIAGNOSIS — F339 Major depressive disorder, recurrent, unspecified: Secondary | ICD-10-CM | POA: Diagnosis present

## 2021-09-23 DIAGNOSIS — Z515 Encounter for palliative care: Secondary | ICD-10-CM | POA: Diagnosis not present

## 2021-09-23 DIAGNOSIS — Z66 Do not resuscitate: Secondary | ICD-10-CM | POA: Diagnosis present

## 2021-09-23 DIAGNOSIS — S2242XS Multiple fractures of ribs, left side, sequela: Secondary | ICD-10-CM

## 2021-09-23 DIAGNOSIS — N400 Enlarged prostate without lower urinary tract symptoms: Secondary | ICD-10-CM | POA: Diagnosis present

## 2021-09-23 DIAGNOSIS — W19XXXA Unspecified fall, initial encounter: Secondary | ICD-10-CM | POA: Diagnosis present

## 2021-09-23 DIAGNOSIS — S2242XA Multiple fractures of ribs, left side, initial encounter for closed fracture: Secondary | ICD-10-CM | POA: Diagnosis present

## 2021-09-23 DIAGNOSIS — F419 Anxiety disorder, unspecified: Secondary | ICD-10-CM | POA: Diagnosis present

## 2021-09-23 DIAGNOSIS — J441 Chronic obstructive pulmonary disease with (acute) exacerbation: Secondary | ICD-10-CM

## 2021-09-23 DIAGNOSIS — C799 Secondary malignant neoplasm of unspecified site: Secondary | ICD-10-CM | POA: Diagnosis present

## 2021-09-23 DIAGNOSIS — J810 Acute pulmonary edema: Secondary | ICD-10-CM | POA: Diagnosis present

## 2021-09-23 DIAGNOSIS — M459 Ankylosing spondylitis of unspecified sites in spine: Secondary | ICD-10-CM | POA: Diagnosis present

## 2021-09-23 DIAGNOSIS — Z91199 Patient's noncompliance with other medical treatment and regimen due to unspecified reason: Secondary | ICD-10-CM | POA: Diagnosis not present

## 2021-09-23 DIAGNOSIS — I10 Essential (primary) hypertension: Secondary | ICD-10-CM | POA: Diagnosis present

## 2021-09-23 DIAGNOSIS — I501 Left ventricular failure: Secondary | ICD-10-CM | POA: Diagnosis present

## 2021-09-23 DIAGNOSIS — G894 Chronic pain syndrome: Secondary | ICD-10-CM | POA: Diagnosis present

## 2021-09-23 DIAGNOSIS — C3490 Malignant neoplasm of unspecified part of unspecified bronchus or lung: Secondary | ICD-10-CM | POA: Diagnosis not present

## 2021-09-23 DIAGNOSIS — Z885 Allergy status to narcotic agent status: Secondary | ICD-10-CM | POA: Diagnosis not present

## 2021-09-23 DIAGNOSIS — Z91014 Allergy to mammalian meats: Secondary | ICD-10-CM | POA: Diagnosis not present

## 2021-09-23 DIAGNOSIS — C3492 Malignant neoplasm of unspecified part of left bronchus or lung: Secondary | ICD-10-CM | POA: Diagnosis present

## 2021-09-23 DIAGNOSIS — F332 Major depressive disorder, recurrent severe without psychotic features: Secondary | ICD-10-CM

## 2021-09-23 DIAGNOSIS — R45851 Suicidal ideations: Secondary | ICD-10-CM

## 2021-09-23 DIAGNOSIS — J9621 Acute and chronic respiratory failure with hypoxia: Secondary | ICD-10-CM | POA: Diagnosis present

## 2021-09-23 DIAGNOSIS — Z91018 Allergy to other foods: Secondary | ICD-10-CM | POA: Diagnosis not present

## 2021-09-23 DIAGNOSIS — Y92009 Unspecified place in unspecified non-institutional (private) residence as the place of occurrence of the external cause: Secondary | ICD-10-CM | POA: Diagnosis not present

## 2021-09-23 DIAGNOSIS — Z888 Allergy status to other drugs, medicaments and biological substances status: Secondary | ICD-10-CM | POA: Diagnosis not present

## 2021-09-23 DIAGNOSIS — I776 Arteritis, unspecified: Secondary | ICD-10-CM | POA: Diagnosis present

## 2021-09-23 DIAGNOSIS — E119 Type 2 diabetes mellitus without complications: Secondary | ICD-10-CM | POA: Diagnosis present

## 2021-09-23 LAB — RESPIRATORY PANEL BY PCR

## 2021-09-23 LAB — CBC WITH DIFFERENTIAL/PLATELET
Abs Immature Granulocytes: 0.05 10*3/uL (ref 0.00–0.07)
Basophils Absolute: 0.1 10*3/uL (ref 0.0–0.1)
Basophils Relative: 1 %
Eosinophils Absolute: 0.2 10*3/uL (ref 0.0–0.5)
Eosinophils Relative: 1 %
HCT: 38.9 % — ABNORMAL LOW (ref 39.0–52.0)
Hemoglobin: 12.5 g/dL — ABNORMAL LOW (ref 13.0–17.0)
Immature Granulocytes: 0 %
Lymphocytes Relative: 12 %
Lymphs Abs: 1.6 10*3/uL (ref 0.7–4.0)
MCH: 27.1 pg (ref 26.0–34.0)
MCHC: 32.1 g/dL (ref 30.0–36.0)
MCV: 84.2 fL (ref 80.0–100.0)
Monocytes Absolute: 1.3 10*3/uL — ABNORMAL HIGH (ref 0.1–1.0)
Monocytes Relative: 10 %
Neutro Abs: 10.2 10*3/uL — ABNORMAL HIGH (ref 1.7–7.7)
Neutrophils Relative %: 76 %
Platelets: 483 10*3/uL — ABNORMAL HIGH (ref 150–400)
RBC: 4.62 MIL/uL (ref 4.22–5.81)
RDW: 15.1 % (ref 11.5–15.5)
WBC: 13.4 10*3/uL — ABNORMAL HIGH (ref 4.0–10.5)
nRBC: 0 % (ref 0.0–0.2)

## 2021-09-23 LAB — COMPREHENSIVE METABOLIC PANEL
ALT: 14 U/L (ref 0–44)
AST: 16 U/L (ref 15–41)
Albumin: 3.7 g/dL (ref 3.5–5.0)
Alkaline Phosphatase: 85 U/L (ref 38–126)
Anion gap: 9 (ref 5–15)
BUN: 11 mg/dL (ref 8–23)
CO2: 22 mmol/L (ref 22–32)
Calcium: 9 mg/dL (ref 8.9–10.3)
Chloride: 102 mmol/L (ref 98–111)
Creatinine, Ser: 1.06 mg/dL (ref 0.61–1.24)
GFR, Estimated: >60 mL/min (ref >60)
Glucose, Bld: 146 mg/dL — ABNORMAL HIGH (ref 70–99)
Potassium: 4.5 mmol/L (ref 3.5–5.1)
Sodium: 133 mmol/L — ABNORMAL LOW (ref 135–145)
Total Bilirubin: 0.3 mg/dL (ref 0.3–1.2)
Total Protein: 6.8 g/dL (ref 6.5–8.1)

## 2021-09-23 LAB — PROTIME-INR
INR: 1.1 (ref 0.8–1.2)
Prothrombin Time: 13.7 seconds (ref 11.4–15.2)

## 2021-09-23 LAB — PROCALCITONIN: Procalcitonin: <0.1 ng/mL

## 2021-09-23 LAB — MAGNESIUM: Magnesium: 1.9 mg/dL (ref 1.7–2.4)

## 2021-09-23 LAB — BRAIN NATRIURETIC PEPTIDE: B Natriuretic Peptide: 92.2 pg/mL (ref 0.0–100.0)

## 2021-09-23 LAB — APTT: aPTT: 31 seconds (ref 24–36)

## 2021-09-23 MED ORDER — ONDANSETRON HCL 4 MG/2ML IJ SOLN
4.0000 mg | Freq: Four times a day (QID) | INTRAMUSCULAR | Status: DC | PRN
  Administered 2021-09-23 – 2021-09-26 (×3): 4 mg via INTRAVENOUS
  Filled 2021-09-23 (×3): qty 2

## 2021-09-23 MED ORDER — ACETAMINOPHEN 650 MG RE SUPP: 650.0000 mg | Freq: Four times a day (QID) | RECTAL | Status: DC | PRN

## 2021-09-23 MED ORDER — IPRATROPIUM-ALBUTEROL 0.5-2.5 (3) MG/3ML IN SOLN
3.0000 mL | Freq: Four times a day (QID) | RESPIRATORY_TRACT | Status: DC
  Administered 2021-09-23 (×3): 3 mL via RESPIRATORY_TRACT
  Filled 2021-09-23 (×4): qty 3

## 2021-09-23 MED ORDER — GABAPENTIN 300 MG PO CAPS
300.0000 mg | ORAL_CAPSULE | Freq: Two times a day (BID) | ORAL | Status: DC
  Administered 2021-09-23 – 2021-09-26 (×7): 300 mg via ORAL
  Filled 2021-09-23 (×7): qty 1

## 2021-09-23 MED ORDER — OXYCODONE HCL 5 MG PO TABS
5.0000 mg | ORAL_TABLET | ORAL | Status: DC | PRN
  Administered 2021-09-23 – 2021-09-26 (×18): 10 mg via ORAL
  Filled 2021-09-23 (×18): qty 2

## 2021-09-23 MED ORDER — DILTIAZEM HCL ER COATED BEADS 180 MG PO CP24
180.0000 mg | ORAL_CAPSULE | Freq: Every day | ORAL | Status: DC
  Administered 2021-09-23 – 2021-09-26 (×4): 180 mg via ORAL
  Filled 2021-09-23 (×4): qty 1

## 2021-09-23 MED ORDER — APIXABAN 5 MG PO TABS
5.0000 mg | ORAL_TABLET | Freq: Two times a day (BID) | ORAL | Status: DC
  Administered 2021-09-23 – 2021-09-26 (×7): 5 mg via ORAL
  Filled 2021-09-23 (×8): qty 1

## 2021-09-23 MED ORDER — ENSURE ENLIVE PO LIQD
237.0000 mL | Freq: Two times a day (BID) | ORAL | Status: DC
  Administered 2021-09-23 – 2021-09-26 (×6): 237 mL via ORAL

## 2021-09-23 MED ORDER — POLYETHYLENE GLYCOL 3350 17 G PO PACK: 17.0000 g | PACK | Freq: Every day | ORAL | Status: DC | PRN

## 2021-09-23 MED ORDER — BUPROPION HCL ER (XL) 150 MG PO TB24
300.0000 mg | ORAL_TABLET | Freq: Every day | ORAL | Status: DC
  Administered 2021-09-23 – 2021-09-26 (×4): 300 mg via ORAL
  Filled 2021-09-23 (×4): qty 2

## 2021-09-23 MED ORDER — ONDANSETRON HCL 4 MG PO TABS: 4.0000 mg | ORAL_TABLET | Freq: Four times a day (QID) | ORAL | Status: DC | PRN

## 2021-09-23 MED ORDER — PREDNISONE 50 MG PO TABS
50.0000 mg | ORAL_TABLET | Freq: Every day | ORAL | Status: DC
  Administered 2021-09-23 – 2021-09-26 (×4): 50 mg via ORAL
  Filled 2021-09-23 (×4): qty 1

## 2021-09-23 MED ORDER — ADULT MULTIVITAMIN W/MINERALS CH
1.0000 | ORAL_TABLET | Freq: Every day | ORAL | Status: DC
  Administered 2021-09-23 – 2021-09-26 (×4): 1 via ORAL
  Filled 2021-09-23 (×4): qty 1

## 2021-09-23 MED ORDER — FUROSEMIDE 10 MG/ML IJ SOLN
40.0000 mg | Freq: Two times a day (BID) | INTRAMUSCULAR | Status: AC
Start: 2021-09-23 — End: 2021-09-24

## 2021-09-23 MED ORDER — MELATONIN 3 MG PO TABS
6.0000 mg | ORAL_TABLET | Freq: Every day | ORAL | Status: DC
  Administered 2021-09-23 – 2021-09-25 (×3): 6 mg via ORAL
  Filled 2021-09-23 (×3): qty 2

## 2021-09-23 MED ORDER — IPRATROPIUM-ALBUTEROL 0.5-2.5 (3) MG/3ML IN SOLN
3.0000 mL | Freq: Three times a day (TID) | RESPIRATORY_TRACT | Status: DC
  Administered 2021-09-24 – 2021-09-25 (×2): 3 mL via RESPIRATORY_TRACT
  Filled 2021-09-23 (×4): qty 3

## 2021-09-23 MED ORDER — TAMSULOSIN HCL 0.4 MG PO CAPS
0.4000 mg | ORAL_CAPSULE | Freq: Every day | ORAL | Status: DC
  Administered 2021-09-23 – 2021-09-26 (×4): 0.4 mg via ORAL
  Filled 2021-09-23 (×4): qty 1

## 2021-09-23 MED ORDER — CYCLOSPORINE 0.05 % OP EMUL
1.0000 [drp] | Freq: Two times a day (BID) | OPHTHALMIC | Status: DC
  Administered 2021-09-23 – 2021-09-26 (×7): 1 [drp] via OPHTHALMIC
  Filled 2021-09-23 (×8): qty 30

## 2021-09-23 MED ORDER — IPRATROPIUM-ALBUTEROL 0.5-2.5 (3) MG/3ML IN SOLN: 3.0000 mL | RESPIRATORY_TRACT | Status: DC | PRN

## 2021-09-23 MED ORDER — BUSPIRONE HCL 5 MG PO TABS
15.0000 mg | ORAL_TABLET | Freq: Every day | ORAL | Status: DC
  Administered 2021-09-23 – 2021-09-26 (×4): 15 mg via ORAL
  Filled 2021-09-23 (×4): qty 3

## 2021-09-23 MED ORDER — ACETAMINOPHEN 325 MG PO TABS
650.0000 mg | ORAL_TABLET | Freq: Four times a day (QID) | ORAL | Status: DC | PRN
  Administered 2021-09-23 – 2021-09-26 (×3): 650 mg via ORAL
  Filled 2021-09-23 (×3): qty 2

## 2021-09-23 MED ORDER — LURASIDONE HCL 40 MG PO TABS
40.0000 mg | ORAL_TABLET | Freq: Every day | ORAL | Status: DC
  Administered 2021-09-23 – 2021-09-25 (×3): 40 mg via ORAL
  Filled 2021-09-23 (×4): qty 1

## 2021-09-23 MED ORDER — FUROSEMIDE 10 MG/ML IJ SOLN: 40.0000 mg | Freq: Once | INTRAMUSCULAR | Status: DC

## 2021-09-23 MED ORDER — MYCOPHENOLATE MOFETIL 250 MG PO CAPS
1500.0000 mg | ORAL_CAPSULE | Freq: Two times a day (BID) | ORAL | Status: DC
  Administered 2021-09-23 – 2021-09-26 (×7): 1500 mg via ORAL
  Filled 2021-09-23 (×8): qty 6

## 2021-09-23 NOTE — Assessment & Plan Note (Signed)
   Patient appears to be exhibiting some degree of pulmonary edema on chest x-ray  Supported by concurrent paroxysmal nocturnal dyspnea and pillow orthopnea  Lung exam revealing bilateral lower field rales  Multifactorial presentation with acute pulmonary edema in the setting of mild COPD exacerbation  Treating patient with limited course of intravenous Lasix therapy and monitoring for symptomatic improvement  Supplemental oxygen for bouts of hypoxia  Bronchodilator therapy and systemic steroids for concurrent COPD exacerbation

## 2021-09-23 NOTE — Assessment & Plan Note (Signed)
   Patient typically not using CPAP nightly at home  Supplemental oxygen with sleep as necessary

## 2021-09-23 NOTE — Assessment & Plan Note (Signed)
   No evidence of acute on chronic respiratory failure at this time  We will titrate supplemental oxygen as necessary to achieve oxygen saturations of 88 to 92%.

## 2021-09-23 NOTE — Assessment & Plan Note (Signed)
   Continue home regimen of Flomax 

## 2021-09-23 NOTE — Progress Notes (Signed)
Pt arrived to the unit via stretcher without complication. VSS, pt resting comfortably in bed at lowest position with call bell in reach.

## 2021-09-23 NOTE — Progress Notes (Signed)
CHART NOTE I was alerted to the admission of this patient to the hospital with pulmonary edema.  He has stage IV lung cancer and I had several discussions with him in the past during his previous hospitalization about his current condition and treatment options.  I strongly recommended for the patient to consider palliative care and hospice but he was interested in treatment.  He was given follow-up appointment at the cancer center several times but he does not show up for his appointments. Unfortunately I do not have much to add to his care at this point and I would strongly recommend for him to consider palliative care and hospice. I keep getting consulted on him every time he comes to the hospital and I give the same recommendations. I will be out of town starting this afternoon until Monday. If the patient is still interested in systemic treatment, I will be happy to arrange for him a follow-up visit in the clinic to discuss it in more details but my first recommendation is hospice and palliative care. Thank you.

## 2021-09-23 NOTE — H&P (Signed)
History and Physical    Patient: Mario [name] MRN: [id number] DOA: 09/22/2021  Date of Service: the patient was seen and examined on 09/23/2021  Patient coming from: Home  Chief Complaint:  Chief Complaint  Patient presents with   Shortness of Breath   SI    HPI:   71 year old male with past medical history of Stage IIb adenocarcinoma of the lung, chronic respiratory failure, malignant pleural effusion status post left Pleurx catheter placement (with eventual removal on 7/5), vasculitis on methotrexate, PE on Eliquis, chronic pain syndrome, obstructive sleep apnea on CPAP who presented to [facility] emergency department with complaints of shortness of breath.  Patient explains that yesterday morning he woke up from sleep feeling short of breath.  Patient describes a short of breath initially mild in intensity but progressively becoming more more severe throughout the day.  This has been associated with nonproductive cough.  Patient is also complaining of severe left-sided chest pain although the chest pain has been going on for several weeks this patient was diagnosed with a fracture of the left seventh and eighth ribs.  Patient describes the chest pain as severe in intensity and worse with movement.  Patient also explains that he has been unable to lie flat as of late, consistent with pillow orthopnea.  Patient additionally has been waking up in the middle of the night gasping for air concerning for paroxysmal nocturnal dyspnea.  Patient denies fevers, sick contacts, recent travel or contact with confirmed COVID-19 infection.  Patient's symptoms continue to worsen until he eventually presented to [facility] emergency department via EMS.  Upon EMS arrival to the patient's residence, they noted that the patient was verbalizing his suicidal ideations.  Patient was then brought into [facility] emergency department for evaluation.  Upon evaluation in the emergency  department, chest x-ray was found to have evidence of pulmonary edema with a left-sided pleural effusion.  Due to concerns for ongoing symptoms of shortness of breath as well as initial suicidal ideation upon EMS arrival to his residence, the hospitalist group has been contacted to assess the patient for possible admission to the hospital.   Review of Systems: Review of Systems  Respiratory:  Positive for cough and shortness of breath.   Cardiovascular:  Positive for chest pain.  Neurological:  Positive for weakness.  All other systems reviewed and are negative.    Past Medical History:  Diagnosis Date   Anxiety    Bronchitis    COPD (chronic obstructive pulmonary disease) ([place])    Depression    History of radiation therapy    Left lung- 10/05/20-10/15/20- Dr. Gery Pray   Hypertension    lung ca 09/2020   MI (myocardial infarction) [facility])    ????   OSA (obstructive sleep apnea)    Suicide attempt ([place])    Tension pneumothorax 06/27/2016   Uveitis     Past Surgical History:  Procedure Laterality Date   BIOPSY  07/03/2021   Procedure: BIOPSY;  Surgeon: Otis Brace, MD;  Location: [facility];  Service: Gastroenterology;;   BRONCHIAL BIOPSY  07/30/2020   Procedure: BRONCHIAL BIOPSIES;  Surgeon: Garner Nash, DO;  Location: [facility];  Service: Pulmonary;;   BRONCHIAL BRUSHINGS  07/30/2020   Procedure: BRONCHIAL BRUSHINGS;  Surgeon: Garner Nash, DO;  Location: [facility];  Service: Pulmonary;;   BRONCHIAL NEEDLE ASPIRATION BIOPSY  07/30/2020   Procedure: BRONCHIAL NEEDLE ASPIRATION BIOPSIES;  Surgeon: Garner Nash, DO;  Location: [facility];  Service: Pulmonary;;   BRONCHIAL WASHINGS  07/30/2020   Procedure: BRONCHIAL WASHINGS;  Surgeon: Garner Nash, DO;  Location: [facility];  Service: Pulmonary;;   CHEST TUBE INSERTION Left 06/27/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     ESOPHAGOGASTRODUODENOSCOPY N/A 07/03/2021   Procedure: ESOPHAGOGASTRODUODENOSCOPY (EGD);   Surgeon: Otis Brace, MD;  Location: [name];  Service: Gastroenterology;  Laterality: N/A;   IR PERC PLEURAL DRAIN W/INDWELL CATH W/IMG GUIDE  08/04/2021   IR REMOVAL OF PLURAL CATH W/CUFF  09/01/2021   SKIN CANCER EXCISION     VIDEO BRONCHOSCOPY WITH ENDOBRONCHIAL NAVIGATION Left 07/30/2020   Procedure: VIDEO BRONCHOSCOPY WITH ENDOBRONCHIAL NAVIGATION;  Surgeon: Garner Nash, DO;  Location: [facility];  Service: Pulmonary;  Laterality: Left;    Social History:  reports that he quit smoking about 5 years ago. His smoking use included cigarettes. He has a 35.00 pack-year smoking history. He has never used smokeless tobacco. He reports that he does not drink alcohol and does not use drugs.  Allergies  Allergen Reactions   Demerol [Meperidine] Nausea And Vomiting and Other (See Comments)    Made the patient "violently sick"   Zocor [Simvastatin] Nausea And Vomiting and Other (See Comments)    Made him very jittery, also   Beet [Beta Vulgaris] Nausea And Vomiting   Liver Nausea And Vomiting    Family History  Problem Relation Age of Onset   Dementia Father     Prior to Admission medications   Medication Sig Start Date End Date Taking? Authorizing Provider  albuterol (PROVENTIL) (2.5 MG/3ML) 0.083% nebulizer solution Inhale 3 mLs (2.5 mg total) by nebulization every 4 (four) hours as needed for wheezing or shortness of breath. Patient taking differently: Take 2.5 mg by nebulization every 6 (six) hours as needed for wheezing or shortness of breath. 08/04/20 08/23/21  Lavina Hamman, MD  albuterol (VENTOLIN HFA) 108 (90 Base) MCG/ACT inhaler Inhale 2 puffs into the lungs every 6 (six) hours as needed for wheezing. 06/02/20   [provider]  apixaban (ELIQUIS) 5 MG TABS tablet Take 5 mg by mouth 2 (two) times daily.    [provider]  b complex vitamins capsule Take 1 capsule by mouth daily.    [provider]  buPROPion (WELLBUTRIN XL) 300 MG 24 hr tablet  Take 300 mg by mouth daily. 02/05/20   [provider]  busPIRone (BUSPAR) 15 MG tablet Take 15 mg by mouth daily.    [provider]  calcium-vitamin D (OSCAL WITH D) 500-200 MG-UNIT tablet Take 1 tablet by mouth 2 (two) times daily with a meal.    [provider]  carboxymethylcellulose (REFRESH PLUS) 0.5 % SOLN Place 1 drop into both eyes 4 (four) times daily as needed (dry eyes).    [provider]  Cholecalciferol (VITAMIN D-3) 25 MCG (1000 UT) CAPS Take 1,000 Units by mouth 2 (two) times daily.    [provider]  clobetasol ointment (TEMOVATE) 1.69 % Apply 1 Application topically 2 (two) times daily.    [provider]  clotrimazole-betamethasone (LOTRISONE) cream Apply 1 application. topically 2 (two) times daily as needed (fungus). 07/06/21   Aline August, MD  cycloSPORINE (RESTASIS) 0.05 % ophthalmic emulsion Place 1 drop into both eyes 2 (two) times daily.    [provider]  diltiazem (CARDIZEM CD) 180 MG 24 hr capsule Take 180 mg by mouth daily.    [provider]  DULoxetine (CYMBALTA) 60 MG capsule Take 1 capsule (60 mg  total) by mouth 2 (two) times daily. 07/21/17   Lavina Hamman, MD  feeding supplement (ENSURE ENLIVE / ENSURE PLUS) LIQD Take 237 mLs by mouth 3 (three) times daily between meals. Patient taking differently: Take 1 Bottle by mouth daily. 08/04/20   Lavina Hamman, MD  fluticasone Asencion Islam) 50 MCG/ACT nasal spray Place 2 sprays into both nostrils daily.     [provider]  folic acid (FOLVITE) 1 MG tablet Take 1 mg by mouth daily.    [provider]  gabapentin (NEURONTIN) 300 MG capsule Take 1 capsule (300 mg total) by mouth 2 (two) times daily. 07/06/21   Aline August, MD  GARLIC OIL PO Take 1 capsule by mouth daily.    [provider]  guaiFENesin (MUCINEX) 600 MG 12 hr tablet Take 1 tablet (600 mg total) by mouth 2 (two) times daily as needed for cough. 07/06/21   Aline August, MD  lidocaine (LIDODERM) 5 % Place 2 patches onto the skin daily. Remove & Discard patch within 12 hours or as directed by MD 09/06/21   Jonetta Osgood, MD  lurasidone (LATUDA) 40 MG TABS tablet Take 40 mg by mouth daily after supper.    [provider]  Melatonin 3 MG TABS Take 6 mg by mouth at bedtime.    [provider]  Mometasone Furoate 200 MCG/ACT AERO Inhale 2 puffs into the lungs at bedtime.    [provider]  mycophenolate (CELLCEPT) 250 MG capsule Take 1,500 mg by mouth 2 (two) times daily.    [provider]  nitroGLYCERIN (NITROSTAT) 0.4 MG SL tablet Place 1 tablet (0.4 mg total) under the tongue every 5 (five) minutes as needed for chest pain. Patient not taking: Reported on 08/20/2021 07/03/19   Edwin Dada, MD  Olodaterol HCl (STRIVERDI RESPIMAT) 2.5 MCG/ACT AERS Inhale 2 puffs into the lungs daily.    [provider]  oxcarbazepine (TRILEPTAL) 600 MG tablet Take 600-900 mg by mouth See admin instructions. Take 600mg  by mouth during the day, then take  900 mg by mouth at bedtime 07/16/20   [provider]  oxyCODONE (OXY IR/ROXICODONE) 5 MG immediate release tablet Take 1-2 tablets (5-10 mg total) by mouth every 4 (four) hours as needed for severe pain. 9/32/35   Delora Fuel, MD  oxyCODONE (OXYCONTIN) 20 mg 12 hr tablet Take 1 tablet (20 mg total) by mouth every 12 (twelve) hours. 09/06/21   Ghimire, Henreitta Leber, MD  OXYGEN Inhale 2 L into the lungs continuous.    [provider]  polyethylene glycol (MIRALAX / GLYCOLAX) 17 g packet Take 17 g by mouth daily.    [provider]  senna-docusate (SENOKOT-S) 8.6-50 MG tablet Take 1 tablet by mouth 2 (two) times daily. 07/06/21   Aline August, MD  sodium chloride (OCEAN) 0.65 % SOLN nasal spray Place 2 sprays into both nostrils 4 (four) times daily as needed for congestion.    [provider]  tamsulosin (FLOMAX) 0.4 MG CAPS capsule Take 0.4  mg by mouth daily.    [provider]  traZODone (DESYREL) 50 MG tablet Take 75 mg by mouth at bedtime.    [provider]  vitamin C (ASCORBIC ACID) 500 MG tablet Take 500 mg by mouth 2 (two) times daily.    [provider]    Physical Exam:  Vitals:   09/23/21 0345 09/23/21 0400 09/23/21 0453 09/23/21 0458  BP: (!) 148/87 140/86  (!) 149/75  Pulse: 99 98  93  Resp:  18  20  Temp:    98.7 F (37.1 C)  TempSrc:    Oral  SpO2: 100% 96%  96%  Weight:   (!) 137.3 kg   Height:   6\' 2"  (1.88 m)     Constitutional: Awake alert and oriented x3, patient is in mild respiratory distress Skin: no rashes, no lesions, good skin turgor noted. Eyes: Pupils are equally reactive to light.  No evidence of scleral icterus or conjunctival pallor.  ENMT: Moist mucous membranes noted.  Posterior pharynx clear of any exudate or lesions.   Neck: normal, supple, no masses, no thyromegaly.  No evidence of jugular venous distension.   Respiratory: Coarse breath sounds in all fields bilaterally with rhonchi.  Also notable significant rales in the bilateral lower fields.  W increased respiratory effort noted without accessory muscle use.   Cardiovascular: Regular rate and rhythm, no murmurs / rubs / gallops. No extremity edema. 2+ pedal pulses. No carotid bruits.  Chest:   Nontender without crepitus or deformity.   Back:   Nontender without crepitus or deformity. Abdomen: Abdomen is protuberant but soft no evidence of intra-abdominal masses.  Positive bowel sounds noted in all quadrants.   Musculoskeletal: No joint deformity upper and lower extremities. Good ROM, no contractures. Normal muscle tone.  Neurologic: CN 2-12 grossly intact. Sensation intact.  Patient moving all 4 extremities spontaneously.  Patient is following all commands.  Patient is responsive to verbal stimuli.   Psychiatric: Patient exhibits a extremely depressed mood with flat affect.  Patient is currently contracting  for safety.  Patient seems to possess insight as to their current situation.    Data Reviewed:  I have personally reviewed and interpreted labs, imaging.  Significant findings are:  CBC revealing a white blood cell count of 8.2, hemoglobin 8.6, hematocrit 29.5, platelet count of 175 Chemistry revealing sodium 135, potassium 4.6, chloride 103, bicarbonate 23, glucose 123 Urinalysis unremarkable Chest x-ray personally reviewed revealing patchy bilateral infiltrates concerning for pulmonary edema with a small left pleural effusion.  EKG: Personally reviewed.  Rhythm is normal sinus rhythm with heart rate of 96 bpm.  Evidence of PVCs.  No dynamic ST segment changes appreciated.   Assessment and Plan: * Acute pulmonary edema ([place]) Patient appears to be exhibiting some degree of pulmonary edema on chest x-ray Supported by concurrent paroxysmal nocturnal dyspnea and pillow orthopnea Lung exam revealing bilateral lower field rales Multifactorial presentation with acute pulmonary edema in the setting of mild COPD exacerbation Treating patient with limited course of intravenous Lasix therapy and monitoring for symptomatic improvement Supplemental oxygen for bouts of hypoxia Bronchodilator therapy and systemic steroids for concurrent COPD exacerbation  COPD with acute exacerbation ([place]) Patient exhibiting worsening shortness of breath with intermittent wheezing on exam Concern for mild COPD exacerbation likely exacerbated by concurrent pulmonary edema Chest x-ray reveals no evidence of concurrent pneumonia Initiating aggressive bronchodilator therapy with oral prednisone therapy Supplemental oxygen initiated with target oxygen saturations of between 89 to 92%.   Suicidal ideation EMS reported patient was initially voicing suicidal ideation upon arrival to his place of residence Upon evaluation here in the emergency department patient appears to be contracting for safety I do not believe the  patient is currently harming himself and do not believe that involuntary commitment or suicide precautions are necessary at this time We will place behavioral health consultation however for assistance in managing his other mental health issues particularly his depression  Fracture  of ribs, two, left, sequela Notable fractures of the left seventh and eighth rib as identified on chest imaging in late June Significant pain as a result Providing patient with as needed short acting oxycodone for bouts of pain. We will additionally provide patient with incentive spirometry  Chronic respiratory failure with hypoxia (HCC) No evidence of acute on chronic respiratory failure at this time We will titrate supplemental oxygen as necessary to achieve oxygen saturations of 88 to 92%.  Major depressive disorder, recurrent episode ([place]) Continuing home regimen of psychotropic medications Obtaining psychiatry consultation considering suicidal ideation Patient is currently contracting for safety  Non-small cell cancer of left lung ([place]) Adding Dr. Julien Nordmann to the treatment team, his input in the management this case would be greatly appreciated. Otherwise, outpatient follow-up  BPH (benign prostatic hyperplasia) Continue home regimen of Flomax  OSA (obstructive sleep apnea) Patient typically not using CPAP nightly at home Supplemental oxygen with sleep as necessary  History of pulmonary embolism Continue home regimen of Eliquis       Code Status:  DNR  code status decision has been confirmed with: patient Family Communication: defeerred   Consults: Palliative Care  Severity of Illness:  The appropriate patient status for this patient is OBSERVATION. Observation status is judged to be reasonable and necessary in order to provide the required intensity of service to ensure the patient's safety. The patient's presenting symptoms, physical exam findings, and initial radiographic and laboratory  data in the context of their medical condition is felt to place them at decreased risk for further clinical deterioration. Furthermore, it is anticipated that the patient will be medically stable for discharge from the hospital within 2 midnights of admission.   Author:  Vernelle Emerald MD  09/23/2021 5:18 AM

## 2021-09-23 NOTE — Assessment & Plan Note (Signed)
   Patient exhibiting worsening shortness of breath with intermittent wheezing on exam  Concern for mild COPD exacerbation likely exacerbated by concurrent pulmonary edema  Chest x-ray reveals no evidence of concurrent pneumonia  Initiating aggressive bronchodilator therapy with oral prednisone therapy  Supplemental oxygen initiated with target oxygen saturations of between 89 to 92%.

## 2021-09-23 NOTE — TOC Initial Note (Signed)
Transition of Care [facility]) - Initial/Assessment Note    Patient Details  Name: Mario [name] MRN: [id number] Date of Birth: March 14, 1950  [facility] CM/SW Contact:    Ninfa Meeker, RN Phone Number: 09/23/2021, 1:52 PM  Clinical Narrative:                 Transition of Care Screening Note:  Transition of Care Department [facility]) has reviewed patient and no TOC needs have been identified at this time. We will continue to monitor patient advancement through Interdisciplinary progressions. If new patient transition needs arise, please place a consult.         Patient Goals and CMS Choice        Expected Discharge Plan and Services                                                Prior Living Arrangements/Services                       Activities of Daily Living Home Assistive Devices/Equipment: CPAP, Cane (specify quad or straight) (straight) ADL Screening (condition at time of admission) Patient's cognitive ability adequate to safely complete daily activities?: Yes Is the patient deaf or have difficulty hearing?: No Does the patient have difficulty seeing, even when wearing glasses/contacts?: Yes Does the patient have difficulty concentrating, remembering, or making decisions?: No Patient able to express need for assistance with ADLs?: No Does the patient have difficulty dressing or bathing?: No Independently performs ADLs?: Yes (appropriate for developmental age) Does the patient have difficulty walking or climbing stairs?: Yes Weakness of Legs: Both Weakness of Arms/Hands: Both  Permission Sought/Granted                  Emotional Assessment              Admission diagnosis:  Shortness of breath [R06.02] Weakness [R53.1] SOB (shortness of breath) [R06.02] Malignant neoplasm of lung, unspecified laterality, unspecified part of lung (HCC) [C34.90] Anemia, unspecified type [D64.9] Depression, unspecified depression type  [F32.A] Pulmonary edema cardiac cause (HCC) [I50.1] Patient Active Problem List   Diagnosis Date Noted   Acute pulmonary edema ([place]) 09/23/2021   BPH (benign prostatic hyperplasia) 09/23/2021   Pulmonary edema cardiac cause ([place]) 09/23/2021   Fracture of ribs, two, left, sequela 08/24/2021   Pleural effusion, left 08/20/2021   Metastatic carcinoma ([place]) 08/07/2021   Non-small cell lung cancer, left ([place]) 08/07/2021   Chronic respiratory failure with hypoxia ([place]) 08/07/2021   Rib pain 08/03/2021   Encounter for antineoplastic chemotherapy 07/20/2021   Leg paresthesia 06/17/2021   Obesity, Class III, BMI 40-49.9 (morbid obesity) ([place]) 06/17/2021   Pneumonia 06/17/2021   Hypertension    Allergic rhinitis    History of pulmonary embolism    Hyponatremia 05/20/2021   Mood disorder ([place]) 02/03/2021   Non-small cell cancer of left lung ([place]) 09/21/2020   Lung nodule 07/30/2020   Mass of left lung    Constipation    COPD with acute exacerbation ([place]) 01/05/2020   Vertigo 12/26/2019   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   Stage 3a chronic kidney disease ([place]) 12/26/2019   Abnormal CT of the chest 12/26/2019   COPD (chronic obstructive pulmonary disease) ([place])    Hyperglycemia 06/23/2019   OSA (obstructive sleep apnea) 06/23/2019   Suicidal ideation  06/23/2019   Major depressive disorder, recurrent episode ([place]) 10/12/2018   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   Cellulitis of both feet 07/16/2017   DM2 (diabetes mellitus, type 2) ([place]) 07/16/2017   HLA B27 (HLA B27 positive) 07/16/2017   Hyperkalemia    Hypoxia    Dyspnea 05/23/2014   Malingering 01/21/2013   Depression 01/11/2013   Tobacco abuse 01/11/2013   Obesity, unspecified 01/11/2013   Hypertension associated with diabetes ([place]) 01/10/2013   PCP:  Clinic, [place]:   [place], [place] - [address] [place] [address] Phone: [phone] Fax: [phone]     Social Determinants of Health (SDOH) Interventions    Readmission Risk Interventions    08/23/2021   11:48 AM 08/05/2021   11:02 AM 06/07/2021    4:15 PM  Readmission Risk Prevention Plan  Transportation Screening Complete Complete Complete  Medication Review (RN Care Manager) Complete Complete Complete  PCP or Specialist appointment within 3-5 days of discharge Complete Complete Complete  HRI or Home Care Consult Complete Complete Complete  SW Recovery Care/Counseling Consult Complete Complete Complete  Palliative Care Screening Not Applicable Not Applicable Not [place] Not Applicable Not Applicable Not Applicable

## 2021-09-23 NOTE — Progress Notes (Signed)
Initial Nutrition Assessment  DOCUMENTATION CODES:   Obesity unspecified  INTERVENTION:  Liberalize diet from a heart healthy to a 2g sodium diet to provide widest variety of menu options to enhance nutritional adequacy Ensure Enlive po BID, each supplement provides 350 kcal and 20 grams of protein. MVI with minerals daily  NUTRITION DIAGNOSIS:   Increased nutrient needs related to chronic illness (adenocarcinoma of lung) as evidenced by estimated needs.  GOAL:   Patient will meet greater than or equal to 90% of their needs  MONITOR:   PO intake, Supplement acceptance, Diet advancement, Labs, Weight trends, I & O's  REASON FOR ASSESSMENT:   Malnutrition Screening Tool    ASSESSMENT:   Pt admitted from home d/t SOB and SI, found to have acute pulmonary edema. PMH significant for Stage IIb adenocarcinoma of the lung, chronic respiratory failure, malignant pleural effusion s/p L pleurx cath placement (removed 7/5), vasculitis, PE, chronic pain syndrome and OSA.  Pt sitting on EOB at time of visit. Provided therapeutic listening as he expressed difficulties with his income and current living situation. He states that within the last 4 months his appetite and PO intake has declined. He was admitted to [facility] for ~10 days and more recently to [facility] for ~10 day and just got home about a week and a half ago. He states that during these admissions he did not eat well as he did not enjoy the food.   He had breakfast this morning. Reports that he returned the first tray as he was not hungry, it was not what he wanted, it was room temp, and not enough food. He was able to receive another tray but reports that this was too much food.   Pt states that he has had a 40 lb wt loss within the last 4 months r/t cancer diagnosis and rehab admissions. States that his most recently known wt was 299 lbs PTA. Reviewed wt history. Pt's wt has been trending down since 01/11/21. Down 16.9  kg since that time.   Edema: mild pitting BLE  Medications: laisx, melatonin, prednisone  Labs: sodium 133  UOP: 352ml x12 hours I/O's: +529ml since admission  NUTRITION - FOCUSED PHYSICAL EXAM:  Flowsheet Row Most Recent Value  Orbital Region No depletion  Upper Arm Region No depletion  Thoracic and Lumbar Region No depletion  Buccal Region No depletion  Temple Region No depletion  Clavicle Bone Region No depletion  Clavicle and Acromion Bone Region No depletion  Scapular Bone Region No depletion  Dorsal Hand No depletion  Patellar Region No depletion  Anterior Thigh Region No depletion  Posterior Calf Region No depletion  Edema (RD Assessment) None  Hair Reviewed  Eyes Reviewed  Mouth Reviewed  Skin Reviewed  Nails Reviewed       Diet Order:   Diet Order             Diet Heart Room service appropriate? Yes; Fluid consistency: Thin  Diet effective now                   EDUCATION NEEDS:   Education needs have been addressed  Skin:  Skin Assessment: Reviewed RN Assessment  Last BM:  PTA  Height:   Ht Readings from Last 1 Encounters:  09/23/21 6\' 2"  (1.88 m)    Weight:   Wt Readings from Last 1 Encounters:  09/23/21 (!) 137.3 kg    Ideal Body Weight:  86.4 kg  BMI:  Body mass index  is 38.85 kg/m.  Estimated Nutritional Needs:   Kcal:  2200-2400  Protein:  110-125g  Fluid:  >/=2.2L  Clayborne Dana, RDN, LDN Clinical Nutrition

## 2021-09-23 NOTE — ED Notes (Signed)
Dr. Marlyce Huge rounding at bedside

## 2021-09-23 NOTE — Progress Notes (Addendum)
Brief same day progress note:  Patient is a 71 year old male with history of stage 4 adenocarcinoma of the lung, chronic hypoxic respiratory failure, malignant pleural effusion status post left PleurX catheter placement now removed, vasculitis, PE on Eliquis, chronic pain syndrome, OSA on CPAP who presented here with shortness of breath associated with nonproductive cough.  He was also complaining of left-sided chest pain, he recently sustained left seventh, eighth rib fractures.  On presentation, chest x-ray showed pulmonary edema with left-sided pleural effusion.  Started on Lasix  Assessment and plan:  Acute pulmonary edema: Presented with shortness of breath, nonproductive cough.  Chest x-ray showed features of pulmonary edema revealing bilateral infiltrates.  Concurrent history of PND, orthopnea.  Continue Lasix at current dose for today Last echo done on 5/23 showed EF of 60 to 65%, indeterminate diastolic parameters  COPD exacerbation: Found to have wheezing on exam, could be from pulm edema.  Chest x-ray did not show consolidation.  Started on oral prednisone therapy.  Lungs clear today.  Acute on chronic hypoxic respiratory failure: Uses 3 L of oxygen at home, requiring 4 L on presentation.  Now back to 3 L.  Suicidal ideation: As per EMS.  Currently does not show any signs of self-harm.  But feels depressed.  Psychiatry consulted  Left-sided rib fracture: Recently sustained left-sided seventh and eighth rib fractures after fall at home.  Continue supportive care, pain management.  Continue incentive spirometry.  Will consult PT/OT  Non-small cell cancer of left lung: Follows with Dr. Julien Nordmann, oncology consulted here.  Currently not under any treatment.  Oncology recommended palliative care consultation  BPH: On Flomax  OSA: Noncompliant with CPAP.  Leukocytosis: Most likely.  Continue to monitor.  History of PE: On Eliquis  History of vasculitis: On CellCept  Goals of care:  Stage IV lung cancer, following with Dr. Julien Nordmann.  He is noncompliant and does not follow for appointments as per Dr. Worthy Flank note.  Oncology recommending palliative care consultation for hospice.  Consult placed

## 2021-09-23 NOTE — Assessment & Plan Note (Signed)
   EMS reported patient was initially voicing suicidal ideation upon arrival to his place of residence  Upon evaluation here in the emergency department patient appears to be contracting for safety  I do not believe the patient is currently harming himself and do not believe that involuntary commitment or suicide precautions are necessary at this time  We will place behavioral health consultation however for assistance in managing his other mental health issues particularly his depression

## 2021-09-23 NOTE — Assessment & Plan Note (Signed)
   Notable fractures of the left seventh and eighth rib as identified on chest imaging in late June  Significant pain as a result  Providing patient with as needed short acting oxycodone for bouts of pain.  We will additionally provide patient with incentive spirometry

## 2021-09-23 NOTE — Evaluation (Signed)
Physical Therapy Evaluation Patient Details Name: Mario [name] MRN: [id number] DOB: [birth year] Today's Date: 09/23/2021  History of Present Illness  Pt is a 71 y/o male admitted secondary to worsening SOB likely from acute pulmonary edema. PMH includes lung cancer, malignant pleural effusion, COPD, HTN, and PE.  Clinical Impression  Pt admitted secondary to problem above with deficits below. Pt overall at a supervision level for mobility tasks. Reports hx of falls at home and is in the process of getting HHPT set up. Would also benefit from [facility] at home to assist with ADL tasks to ensure safety. Will continue to follow acutely to maximize functional mobility independence and safety.        Recommendations for follow up therapy are one component of a multi-disciplinary discharge planning process, led by the attending physician.  Recommendations may be updated based on patient status, additional functional criteria and insurance authorization.  Follow Up Recommendations Home health PT [facility])      Assistance Recommended at Discharge Intermittent Supervision/Assistance  Patient can return home with the following  A little help with bathing/dressing/bathroom;Assistance with cooking/housework;Assist for transportation;Help with stairs or ramp for entrance    Equipment Recommendations None recommended by PT  Recommendations for Other Services       Functional Status Assessment Patient has had a recent decline in their functional status and demonstrates the ability to make significant improvements in function in a reasonable and predictable amount of time.     Precautions / Restrictions Precautions Precautions: Fall Precaution Comments: hx of multiple falls Restrictions Weight Bearing Restrictions: No      Mobility  Bed Mobility               General bed mobility comments: Seated EOB upon entry.    Transfers Overall transfer level: Needs assistance Equipment used:  Straight cane Transfers: Sit to/from Stand Sit to Stand: Supervision           General transfer comment: Supervision A for safety.    Ambulation/Gait Ambulation/Gait assistance: Supervision Gait Distance (Feet): 50 Feet Assistive device: Straight cane Gait Pattern/deviations: Step-through pattern, Decreased stride length Gait velocity: Decreased     General Gait Details: Mild instability noted, however, no overt LOB. Limited secondary to fatigue.  Stairs            Wheelchair Mobility    Modified Rankin (Stroke Patients Only)       Balance Overall balance assessment: Needs assistance Sitting-balance support: Feet supported Sitting balance-Leahy Scale: Good     Standing balance support: Single extremity supported, No upper extremity supported Standing balance-Leahy Scale: Fair                               Pertinent Vitals/Pain Pain Assessment Pain Assessment: Faces Faces Pain Scale: Hurts little more Pain Location: L side pain / back pain Pain Descriptors / Indicators: Grimacing, Guarding, Discomfort Pain Intervention(s): Limited activity within patient's tolerance, Monitored during session, Repositioned    Home Living Family/patient expects to be discharged to:: Private residence Living Arrangements: Alone Available Help at Discharge: Neighbor;Available PRN/intermittently Type of Home: Apartment Home Access: Stairs to enter Entrance Stairs-Rails: Right Entrance Stairs-Number of Steps: 3   Home Layout: One level Home Equipment: Cane - single point;Shower seat;Rolling Walker (2 wheels) Additional Comments: Wears 3L O2 at baseline. Reports that he is in the process of getting HHPT/OT through [facility] and a [place] aide through the [place]. Trying to get a  life alert.    Prior Function Prior Level of Function : Independent/Modified Independent             Mobility Comments: Intermittent use of SPC vs RW ADLs Comments: Able to care for his  meds, needs assist with community mobility and iADLs. Increasing difficulties with self care including toileting and LB ADLs due to balance, weakness and poor acitivity tolerance.     Hand Dominance   Dominant Hand: Right    Extremity/Trunk Assessment   Upper Extremity Assessment Upper Extremity Assessment: Defer to OT evaluation    Lower Extremity Assessment Lower Extremity Assessment: Generalized weakness    Cervical / Trunk Assessment Cervical / Trunk Assessment: Kyphotic  Communication   Communication: No difficulties  Cognition Arousal/Alertness: Awake/alert Behavior During Therapy: WFL for tasks assessed/performed Overall Cognitive Status: No family/caregiver present to determine baseline cognitive functioning                                          General Comments General comments (skin integrity, edema, etc.): Pt concerned about not being able to afford apartment now that his rommate won't be living with him.    Exercises     Assessment/Plan    PT Assessment Patient needs continued PT services  PT Problem List Decreased strength;Decreased activity tolerance;Decreased balance;Decreased mobility;Decreased safety awareness;Decreased knowledge of precautions;Decreased cognition       PT Treatment Interventions DME instruction;Gait training;Functional mobility training;Therapeutic activities;Therapeutic exercise;Balance training;Patient/family education    PT Goals (Current goals can be found in the Care Plan section)  Acute Rehab PT Goals Patient Stated Goal: to go home PT Goal Formulation: With patient Time For Goal Achievement: 10/07/21 Potential to Achieve Goals: Good    Frequency Min 2X/week     Co-evaluation               AM-PAC PT "6 Clicks" Mobility  Outcome Measure Help needed turning from your back to your side while in a flat bed without using bedrails?: None Help needed moving from lying on your back to sitting on the  side of a flat bed without using bedrails?: A Little Help needed moving to and from a bed to a chair (including a wheelchair)?: A Little Help needed standing up from a chair using your arms (e.g., wheelchair or bedside chair)?: A Little Help needed to walk in hospital room?: A Little Help needed climbing 3-5 steps with a railing? : A Little 6 Click Score: 19    End of Session Equipment Utilized During Treatment: Oxygen Activity Tolerance: Patient limited by fatigue Patient left: in bed;with call bell/phone within reach (sitting EOB) Nurse Communication: Mobility status PT Visit Diagnosis: Unsteadiness on feet (R26.81);Muscle weakness (generalized) (M62.81);History of falling (Z91.81)    Time: 3419-6222 PT Time Calculation (min) (ACUTE ONLY): 16 min   Charges:   PT Evaluation $PT Eval Low Complexity: 1 Low          Reuel Derby, PT, DPT  Acute Rehabilitation Services  Office: [phone]   Rudean Hitt 09/23/2021, 2:21 PM

## 2021-09-23 NOTE — Progress Notes (Signed)
Pt refused IV lasix this am and stated, "I don't need it, I pee enough." Pt was educated on the importance of taking the lasix. Pt resting comfortably in bed at lowest position eating a snack, with call bell in reach.

## 2021-09-23 NOTE — Assessment & Plan Note (Signed)
   Adding Dr. Julien Nordmann to the treatment team, his input in the management this case would be greatly appreciated.  Otherwise, outpatient follow-up

## 2021-09-23 NOTE — Assessment & Plan Note (Signed)
   Continuing home regimen of psychotropic medications  Obtaining psychiatry consultation considering suicidal ideation  Patient is currently contracting for safety

## 2021-09-23 NOTE — Progress Notes (Signed)
[facility]) Hospital Liaison note:  This patient is currently enrolled in [facility] outpatient-based Palliative Care. Will continue to follow for disposition.  Please call with any outpatient palliative questions or concerns.  Thank you, Lorelee Market, LPN [facility] Liaison [phone]

## 2021-09-23 NOTE — Plan of Care (Signed)
  Problem: Education: Goal: Knowledge of General Education information will improve Description Including pain rating scale, medication(s)/side effects and non-pharmacologic comfort measures Outcome: Progressing   Problem: Health Behavior/Discharge Planning: Goal: Ability to manage health-related needs will improve Outcome: Progressing   

## 2021-09-23 NOTE — Evaluation (Signed)
Occupational Therapy Evaluation Patient Details Name: Mario [name] MRN: [id number] DOB: 1950-06-09 Today's Date: 09/23/2021   History of Present Illness 71 y/o male admitted 6/26 after being discharged the same day, secondary to worsening L chest pain and continued PNA. PMH includes COPD, pleural effusion s/p pleurx catheter, lung cancer, HTN, and PE.   Clinical Impression   PTA patient was living alone in a 1-level apartment with 3 STE and was grossly Mod I with ADLs/IADLs with intermittent use of SPC vs RW. Orders groceries online. Manages meds independently with questionable compliance per med chart. Reports not leaving home often. Endorses recurrent falls noting 4 falls with injury in the last 2 months. Patient currently requiring set-up to supervision A grossly for observed ADLs, mobility and transfers with use of SPC. Requires more than increased time for completion of LB ADLs. Education provided on use of AE for hygiene management with toileting but patient not receptive. Patient is limited by deficits listed below including decreased dynamic balance, decreased activity tolerance and generalized weakness and would benefit from continued acute OT services in prep for safe d/c home. Patient reports beginning the process for setting up [place] therapies, a [place] aide and a life alert through a Education officer, museum with the [place]. Reports he no longer has a roommate and may be unable to afford his apartment soon. Recommend return home with supervision A for shower transfers given high fall risk and assist with IADLs including med management. Education on transition to LTC given poor prognosis but patient not in agreement at this time. OT will continue to follow acutely.      Recommendations for follow up therapy are one component of a multi-disciplinary discharge planning process, led by the attending physician.  Recommendations may be updated based on patient status, additional functional criteria and insurance  authorization.   Follow Up Recommendations  No OT follow up    Assistance Recommended at Discharge Frequent or constant Supervision/Assistance  Patient can return home with the following A little help with walking and/or transfers;A little help with bathing/dressing/bathroom;Assistance with cooking/housework;Direct supervision/assist for financial management;Assist for transportation;Help with stairs or ramp for entrance;Direct supervision/assist for medications management    Functional Status Assessment  Patient has not had a recent decline in their functional status  Equipment Recommendations  None recommended by OT    Recommendations for Other Services       Precautions / Restrictions Precautions Precautions: Fall Precaution Comments: High fall risk reporting 4 falls in last several months; monitor O2 sats; recent L 7th and 8th rib fxs Restrictions Weight Bearing Restrictions: No      Mobility Bed Mobility               General bed mobility comments: Seated EOB upon entry.    Transfers Overall transfer level: Needs assistance   Transfers: Sit to/from Stand Sit to Stand: Supervision           General transfer comment: Supervision A for safety.      Balance Overall balance assessment: Needs assistance Sitting-balance support: Feet supported Sitting balance-Leahy Scale: Good     Standing balance support: Single extremity supported Standing balance-Leahy Scale: Fair Standing balance comment: Hx of recurrent falls                           ADL either performed or assessed with clinical judgement   ADL Overall ADL's : Needs assistance/impaired Eating/Feeding: Independent;Sitting   Grooming: Modified independent;Standing  Upper Body Bathing: Supervision/ safety;Sitting   Lower Body Bathing: Supervison/ safety;Sit to/from stand   Upper Body Dressing : Supervision/safety;Sitting   Lower Body Dressing: Supervision/safety;Sit to/from  stand   Toilet Transfer: Supervision/safety;Ambulation Toilet Transfer Details (indicate cue type and reason): Simulated with transfer to EOB with use of SPC.                 Vision   Vision Assessment?: No apparent visual deficits     Perception     Praxis      Pertinent Vitals/Pain Pain Assessment Pain Assessment: Faces Faces Pain Scale: Hurts little more Pain Location: L side pain / back pain Pain Descriptors / Indicators: Sharp, Grimacing, Guarding, Discomfort Pain Intervention(s): Monitored during session     Hand Dominance Right   Extremity/Trunk Assessment Upper Extremity Assessment Upper Extremity Assessment: Overall WFL for tasks assessed   Lower Extremity Assessment Lower Extremity Assessment: Defer to PT evaluation   Cervical / Trunk Assessment Cervical / Trunk Assessment: Kyphotic   Communication Communication Communication: No difficulties   Cognition Arousal/Alertness: Awake/alert Behavior During Therapy: WFL for tasks assessed/performed Overall Cognitive Status: Within Functional Limits for tasks assessed                                       General Comments  VSS on 3L O2 via [place]; no SOB noted with short-distance mobility in room    Exercises     Shoulder Instructions      Home Living Family/patient expects to be discharged to:: Private residence Living Arrangements: Alone Available Help at Discharge: Neighbor;Available PRN/intermittently Type of Home: Apartment Home Access: Stairs to enter Entrance Stairs-Number of Steps: 3 Entrance Stairs-Rails: Right Home Layout: One level     Bathroom Shower/Tub: Occupational psychologist: Standard Bathroom Accessibility: Yes How Accessible: Accessible via walker Home Equipment: [place] - single point;Shower seat;Rolling Walker (2 wheels)   Additional Comments: Wears 3L O2 at baseline. Reports that he is in the process of getting HHPT/OT through [facility] and a [place] aide  through the [place]. Trying to get a life alert.      Prior Functioning/Environment Prior Level of Function : Independent/Modified Independent             Mobility Comments: Intermittent use of SPC vs RW ADLs Comments: Able to care for his meds, needs assist with community mobility and iADLs. Increasing difficulties with self care including toileting and LB ADLs due to balance, weakness and poor acitivity tolerance.        OT Problem List: Decreased activity tolerance;Impaired balance (sitting and/or standing);Decreased safety awareness;Decreased knowledge of precautions;Decreased knowledge of use of DME or AE      OT Treatment/Interventions: Self-care/ADL training;Therapeutic exercise;Energy conservation;DME and/or AE instruction;Therapeutic activities;Balance training;Patient/family education    OT Goals(Current goals can be found in the care plan section) Acute Rehab OT Goals Patient Stated Goal: To return home with [facility] therapies OT Goal Formulation: With patient Time For Goal Achievement: 09/08/21 Potential to Achieve Goals: Good ADL Goals Pt Will Perform Grooming: with modified independence;standing Pt Will Perform Upper Body Dressing: with modified independence;sitting Pt Will Perform Lower Body Dressing: with modified independence;sit to/from stand Pt Will Transfer to Toilet: with modified independence;regular height toilet;ambulating Pt Will Perform Toileting - Clothing Manipulation and hygiene: with modified independence;sit to/from stand Pt Will Perform Tub/Shower Transfer: Shower transfer;with modified independence;ambulating;shower seat  OT Frequency: Min 2X/week  Co-evaluation              AM-PAC OT "6 Clicks" Daily Activity     Outcome Measure Help from another person eating meals?: None Help from another person taking care of personal grooming?: A Little Help from another person toileting, which includes using toliet, bedpan, or urinal?: A Little Help from  another person bathing (including washing, rinsing, drying)?: A Little Help from another person to put on and taking off regular upper body clothing?: A Little Help from another person to put on and taking off regular lower body clothing?: A Little 6 Click Score: 19   End of Session Equipment Utilized During Treatment: Oxygen Nurse Communication: Mobility status  Activity Tolerance: Patient tolerated treatment well Patient left: with call bell/phone within reach  OT Visit Diagnosis: Unsteadiness on feet (R26.81);Other abnormalities of gait and mobility (R26.89);Muscle weakness (generalized) (M62.81)                Time: 8472-0721 OT Time Calculation (min): 17 min Charges:  OT General Charges $OT Visit: 1 Visit OT Evaluation $OT Eval Low Complexity: 1 Low  Mario [name] H. OTR/L Supplemental OT, Department of rehab services [phone]  Mario [name] R H. 09/23/2021, 1:16 PM

## 2021-09-23 NOTE — Assessment & Plan Note (Signed)
   Continue home regimen of Eliquis

## 2021-09-23 NOTE — Consult Note (Signed)
Consultation Note Date: 09/23/2021   Patient Name: Mario [name]  DOB: [birth year]  MRN: [id number]  Age / Sex: 71 y.o., male  PCP: [facility] Referring Physician: Shelly Coss, MD  Reason for Consultation: Establishing goals of care  HPI/Patient Profile: 71 y.o. male  with past medical history of stage 4 adenocarcinoma of the lung, chronic hypoxic respiratory failure, malignant pleural effusion status post left PleurX catheter placement now removed, vasculitis, PE on Eliquis, chronic pain syndrome, and OSA on CPAP  admitted on 09/22/2021 with shortness of breath.  Found to have acute pulmonary edema and started on Lasix.  Oncology was also consulted and palliative care consultation was recommended.  Patient well known to me-I saw him multiple times during his previous admission in early July. Goals at that time were to pursue follow up with oncology.   Clinical Assessment and Goals of Care: I have reviewed medical records including EPIC notes, labs and imaging, received report from RN, assessed the patient and then met with patient to discuss diagnosis prognosis, GOC, EOL wishes, disposition and options.  Patient is familiar with our team and our services.  Much of our conversation centered around patient's dissatisfaction with oncology care.  Provided active listening and trying to provide answers to his questions.  Patient shares since discharge he went to rehab and discharged from their home.  Tells me he thinks rehab went well.  He tells me about his falls at home.  He has lots of concerns about his current living situation; may be moving soon.   We discussed patient's current illness and what it means in the larger context of patient's on-going co-morbidities.  Natural disease trajectory and expectations at EOL were discussed.  We discussed his metastatic lung cancer.  We reviewed prior oncology notes that indicate he may not  be a great candidate for systemic treatment.  He seems to disagree with this.  He tells me he would prefer to pursue treatment than to not pursue treatment.  I shared that this could actually lead to poor outcome as systemic cancer treatment can be incredibly difficult to tolerate.  He expresses understanding but states that he would still prefer to at least discuss this option with oncology further.  I attempted to elicit values and goals of care important to the patient.    The difference between aggressive medical intervention and comfort care was considered in light of the patient's goals of care.   We did review potential role for hospice in his care.  Discussed philosophy of hospice care.  He is not interested in hospice.  Discussed with patient the importance of continued conversation with family and the medical providers regarding overall plan of care and treatment options, ensuring decisions are within the context of the patient's values and GOCs.    Patient is already connected to outpatient palliative services.  We reviewed his symptom management -oxycodone and Oxy IR.  He complains of rib cage pain -offered lidocaine patch and he declines.  He feels oxycodone is most effective at managing his pain.  He confirms he would want his daughter [name] to make decisions for him if he were unable to make them for himself.  Questions and concerns were addressed. The family was encouraged to call with questions or concerns.    Primary Decision Maker PATIENT Daughter [name] as decision-maker if patient unable  SUMMARY OF RECOMMENDATIONS   Patient interested in pursuing oncology follow-up Patient not interested in hospice  Code Status/Advance Care Planning: DNR  Primary Diagnoses: Present on Admission:  Acute pulmonary edema ([place])  COPD with acute exacerbation (HCC)  Chronic respiratory failure with hypoxia (HCC)  Non-small cell cancer of left lung (HCC)  Major depressive  disorder, recurrent episode (HCC)  OSA (obstructive sleep apnea)  BPH (benign prostatic hyperplasia)  History of pulmonary embolism  Pulmonary edema cardiac cause ([place])   I have reviewed the medical record, interviewed the patient and family, and examined the patient. The following aspects are pertinent.  Past Medical History:  Diagnosis Date   Anxiety    Bronchitis    COPD (chronic obstructive pulmonary disease) ([place])    Depression    History of radiation therapy    Left lung- 10/05/20-10/15/20- Dr. Gery Pray   Hypertension    lung ca 09/2020   MI (myocardial infarction) [facility])    ????   OSA (obstructive sleep apnea)    Suicide attempt ([place])    Tension pneumothorax 06/27/2016   Uveitis    Social History   Socioeconomic History   Marital status: Single    Spouse name: Not on file   Number of children: Not on file   Years of education: Not on file   Highest education level: Not on file  Occupational History   Occupation: retired  Tobacco Use   Smoking status: Former    Packs/day: 1.00    Years: 35.00    Total pack years: 35.00    Types: Cigarettes    Quit date: 05/2016    Years since quitting: 5.3   Smokeless tobacco: Never  Vaping Use   Vaping Use: Never used  Substance and Sexual Activity   Alcohol use: No    Alcohol/week: 0.0 standard drinks of alcohol    Comment: denies use of any drugs or alcohol   Drug use: No   Sexual activity: Not on file  Other Topics Concern   Not on file  Social History Narrative   Not on file   Social Determinants of Health   Financial Resource Strain: Low Risk  (09/23/2020)   Overall Financial Resource Strain (CARDIA)    Difficulty of Paying Living Expenses: Not hard at all  Food Insecurity: Not on file  Transportation Needs: No Transportation Needs (09/23/2020)   PRAPARE - Hydrologist (Medical): No    Lack of Transportation (Non-Medical): No  Physical Activity: Not on file  Stress: No  Stress Concern Present (09/23/2020)   [place] Questionnaire    Feeling of Stress : Not at all  Social Connections: Not on file   Family History  Problem Relation Age of Onset   Dementia Father    Scheduled Meds:  apixaban  5 mg Oral BID   buPROPion  300 mg Oral Daily   busPIRone  15 mg Oral Daily   cycloSPORINE  1 drop Both Eyes BID   diltiazem  180 mg Oral Daily   feeding supplement  237 mL Oral BID BM   furosemide  40 mg Intravenous BID   gabapentin  300 mg Oral BID   ipratropium-albuterol  3 mL Nebulization Q6H   lurasidone  40 mg Oral QPC supper   melatonin  6 mg Oral QHS   multivitamin with minerals  1 tablet Oral Daily   mycophenolate  1,500 mg Oral BID   oxyCODONE  20 mg Oral Q12H   predniSONE  50 mg Oral Q breakfast   tamsulosin  0.4 mg Oral Daily   Continuous  Infusions: PRN Meds:.acetaminophen **OR** acetaminophen, ipratropium-albuterol, ondansetron **OR** ondansetron (ZOFRAN) IV, oxyCODONE, polyethylene glycol Allergies  Allergen Reactions   Demerol [Meperidine] Nausea And Vomiting and Other (See Comments)    Made the patient "violently sick"   Zocor [Simvastatin] Nausea And Vomiting and Other (See Comments)    Made him very jittery, also   Beet [Beta Vulgaris] Nausea And Vomiting   Liver Nausea And Vomiting   Review of Systems  Constitutional:  Positive for activity change and fatigue.    Physical Exam Constitutional:      General: He is not in acute distress.    Appearance: He is ill-appearing.  Pulmonary:     Effort: Pulmonary effort is normal.  Skin:    General: Skin is warm and dry.  Neurological:     Mental Status: He is alert.     Vital Signs: BP (!) 141/78   Pulse 93   Temp 97.9 F (36.6 C) (Oral)   Resp 20   Ht _0  (1.88 m)   Wt (!) 137.3 kg   SpO2 96%   BMI 38.85 kg/m  Pain Scale: 0-10   Pain Score: 2    SpO2: SpO2: 96 % O2 Device:SpO2: 96 % O2 Flow Rate: .O2 Flow Rate  (L/min): 3 L/min  IO: Intake/output summary:  Intake/Output Summary (Last 24 hours) at 09/23/2021 1505 Last data filed at 09/23/2021 0549 Gross per 24 hour  Intake 840 ml  Output 300 ml  Net 540 ml    LBM:   Baseline Weight: Weight: (!) 138.3 kg Most recent weight: Weight: (!) 137.3 kg     Palliative Assessment/Data: PPS 40%     *Please note that this is a verbal dictation therefore any spelling or grammatical errors are due to the "[place]" system interpretation.   Juel Burrow, DNP, AGNP-C Palliative Medicine Team [phone] Pager: [phone]

## 2021-09-24 DIAGNOSIS — J810 Acute pulmonary edema: Secondary | ICD-10-CM | POA: Diagnosis not present

## 2021-09-24 LAB — BASIC METABOLIC PANEL
Anion gap: 9 (ref 5–15)
BUN: 13 mg/dL (ref 8–23)
CO2: 27 mmol/L (ref 22–32)
Calcium: 9.3 mg/dL (ref 8.9–10.3)
Chloride: 101 mmol/L (ref 98–111)
Creatinine, Ser: 0.98 mg/dL (ref 0.61–1.24)
GFR, Estimated: >60 mL/min (ref >60)
Glucose, Bld: 148 mg/dL — ABNORMAL HIGH (ref 70–99)
Potassium: 4.1 mmol/L (ref 3.5–5.1)
Sodium: 137 mmol/L (ref 135–145)

## 2021-09-24 MED ORDER — FENTANYL 12 MCG/HR TD PT72
1.0000 | MEDICATED_PATCH | TRANSDERMAL | Status: DC
  Administered 2021-09-24: 1 via TRANSDERMAL
  Filled 2021-09-24 (×2): qty 1

## 2021-09-24 MED ORDER — OXCARBAZEPINE 300 MG PO TABS: 600.0000 mg | ORAL_TABLET | ORAL | Status: DC

## 2021-09-24 MED ORDER — OXCARBAZEPINE 300 MG PO TABS
600.0000 mg | ORAL_TABLET | Freq: Every day | ORAL | Status: DC
  Administered 2021-09-24 – 2021-09-26 (×3): 600 mg via ORAL
  Filled 2021-09-24 (×3): qty 2

## 2021-09-24 MED ORDER — LIDOCAINE 5 % EX PTCH
2.0000 | MEDICATED_PATCH | CUTANEOUS | Status: DC
  Administered 2021-09-24: 2 via TRANSDERMAL
  Filled 2021-09-24 (×3): qty 2

## 2021-09-24 MED ORDER — HYDROXYZINE HCL 25 MG PO TABS
25.0000 mg | ORAL_TABLET | Freq: Once | ORAL | Status: AC | PRN
Start: 2021-09-24 — End: 2021-09-24
  Administered 2021-09-24: 25 mg via ORAL
  Filled 2021-09-24: qty 1

## 2021-09-24 MED ORDER — OXCARBAZEPINE 300 MG PO TABS
900.0000 mg | ORAL_TABLET | Freq: Every day | ORAL | Status: DC
  Administered 2021-09-24 – 2021-09-25 (×2): 900 mg via ORAL
  Filled 2021-09-24 (×3): qty 3

## 2021-09-24 NOTE — Progress Notes (Signed)
Mobility Specialist Progress Note   09/24/21 1231  Mobility  Activity Ambulated with assistance in hallway  Level of Assistance Independent  Assistive Device Cane  Distance Ambulated (ft) 80 ft  Activity Response Tolerated well  $Mobility charge 1 Mobility   Post-Mobility: 90% SpO2  Pt was found sitting EOB and agreeable. No complaints. Left EOB w/call bell in reach with all needs met.   Lucious Groves Mobility Specialist

## 2021-09-24 NOTE — TOC Initial Note (Signed)
Transition of Care [facility]) - Initial/Assessment Note    Patient Details  Name: Mario [name] MRN: [id number] Date of Birth: 1950/09/30  [facility]) CM/SW Contact:    Pollie Friar, RN Phone Number: 09/24/2021, 1:48 PM  Clinical Narrative:                 Pt is from home with a roommate that is moving out. He states he is going to need to move back to his old place. He is asking for assistance with moving. CM offered the [employer] to see if someone would be of assistance and he is agreeable.  Pt already active with home health services through [facility] for PT/OT/RN.  Pt is working with the [place] for extra support at home.  Pt has oxygen at home through [place].  ToC following.  Expected Discharge Plan: [place] Barriers to Discharge: Continued Medical Work up   Patient Goals and CMS Choice   CMS Medicare.gov Compare Post Acute Care list provided to:: Patient Choice offered to / list presented to : Patient  Expected Discharge Plan and Services Expected Discharge Plan: [place]   Discharge Planning Services: CM Consult Post Acute Care Choice: [place] arrangements for the past 2 months: Apartment                           HH Arranged: PT, RN, OT HH Agency: [place] Date [place]: 09/24/21   Representative spoke with at [place]: Malachy Mood  Prior Living Arrangements/Services Living arrangements for the past 2 months: Apartment Lives with:: Roommate Patient language and need for interpreter reviewed:: Yes Do you feel safe going back to the place where you live?: Yes        Care giver support system in place?: No (comment) Current home services: DME, Home OT, Home PT, Home RN (cane/ oxygen with [place]/ shower seat/ walker) Criminal Activity/Legal Involvement Pertinent to Current Situation/Hospitalization: No - Comment as needed  Activities of Daily Living Home  Assistive Devices/Equipment: CPAP, Cane (specify quad or straight) (straight) ADL Screening (condition at time of admission) Patient's cognitive ability adequate to safely complete daily activities?: Yes Is the patient deaf or have difficulty hearing?: No Does the patient have difficulty seeing, even when wearing glasses/contacts?: Yes Does the patient have difficulty concentrating, remembering, or making decisions?: No Patient able to express need for assistance with ADLs?: No Does the patient have difficulty dressing or bathing?: No Independently performs ADLs?: Yes (appropriate for developmental age) Does the patient have difficulty walking or climbing stairs?: Yes Weakness of Legs: Both Weakness of Arms/Hands: Both  Permission Sought/Granted                  Emotional Assessment Appearance:: Appears stated age Attitude/Demeanor/Rapport: Engaged Affect (typically observed): Accepting Orientation: : Oriented to Self, Oriented to Place, Oriented to  Time   Psych Involvement: No (comment)  Admission diagnosis:  Shortness of breath [R06.02] Weakness [R53.1] SOB (shortness of breath) [R06.02] Malignant neoplasm of lung, unspecified laterality, unspecified part of lung (HCC) [C34.90] Anemia, unspecified type [D64.9] Depression, unspecified depression type [F32.A] Pulmonary edema cardiac cause [facility]) [I50.1] Patient Active Problem List   Diagnosis Date Noted   Acute pulmonary edema ([place]) 09/23/2021   BPH (benign prostatic hyperplasia) 09/23/2021   Pulmonary edema cardiac cause ([place]) 09/23/2021   Fracture of ribs, two, left, sequela 08/24/2021   Pleural effusion,  left 08/20/2021   Metastatic carcinoma ([place]) 08/07/2021   Non-small cell lung cancer, left ([place]) 08/07/2021   Chronic respiratory failure with hypoxia ([place]) 08/07/2021   Rib pain 08/03/2021   Encounter for antineoplastic chemotherapy 07/20/2021   Leg paresthesia 06/17/2021   Obesity, Class III, BMI 40-49.9 (morbid  obesity) ([place]) 06/17/2021   Pneumonia 06/17/2021   Hypertension    Allergic rhinitis    History of pulmonary embolism    Hyponatremia 05/20/2021   Mood disorder ([place]) 02/03/2021   Non-small cell cancer of left lung ([place]) 09/21/2020   Lung nodule 07/30/2020   Mass of left lung    Constipation    COPD with acute exacerbation ([place]) 01/05/2020   Vertigo 12/26/2019   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   Stage 3a chronic kidney disease ([place]) 12/26/2019   Abnormal CT of the chest 12/26/2019   COPD (chronic obstructive pulmonary disease) ([place])    Hyperglycemia 06/23/2019   OSA (obstructive sleep apnea) 06/23/2019   Suicidal ideation 06/23/2019   Major depressive disorder, recurrent episode ([place]) 10/12/2018   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 [place] [address] Phone: [phone] Fax: [phone]     Social Determinants of Health (SDOH) Interventions    Readmission Risk Interventions    08/23/2021   11:48 AM 08/05/2021   11:02 AM 06/07/2021    4:15 PM  Readmission Risk Prevention Plan  Transportation Screening Complete Complete Complete  Medication Review (RN Care Manager) Complete Complete Complete  PCP or Specialist appointment within 3-5 days of discharge Complete Complete Complete  HRI or Home Care Consult Complete Complete Complete  SW Recovery Care/Counseling Consult Complete Complete Complete  Palliative Care Screening  Not Applicable Not Applicable Not [place] Not Applicable Not Applicable Not Applicable

## 2021-09-24 NOTE — Progress Notes (Signed)
PROGRESS NOTE  Mario [name]  DOB: 1950-09-09  PCP: [facility] [place] WVP:[id number]  DOA: 09/22/2021  LOS: 1 day  Hospital Day: 3  Brief narrative: Mario [name] is a 71 y.o. 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 He has been hospitalized 6 times in last 4 months for respiratory failure due to pneumonia, pleural effusion.  In June, he was recommended SNF but went home.  Most recent hospitalization 6/26 to 7/10 during which he was treated for pneumonia, left pleural catheter was removed on 7/5.  Patient presented to the ED on 7/16 with complaint of progressively worsening shortness of breath for 2 days, nonproductive cough, severe left-sided chest pain at the site of known left seventh and eighth rib fracture.  Also reports orthopnea.  EMS also noted that patient was verbalizing suicidal ideations.  He was hence brought to the ED.  In the ED, patient was afebrile, heart rate in 90s, blood pressure in 140s, breathing on 3 L oxygen by nasal cannula Labs mostly unremarkable except for hemoglobin low at 8.6 Chest x-ray showed perihilar infiltrates greater on the left. Admitted to hospitalist service. Oncology service was consulted.  Recommended palliative care/hospice. Palliative care discussed with patient.  He still wants to pursue oncology follow-up  Subjective: Patient was seen and examined this morning.  Pleasant elderly Caucasian male.  Sitting over the edge of the bed.  Feels better than at presentation.  Hurting significantly at times. Chart reviewed In the last 24 hours, patient has been afebrile, heart rate in 90s, blood pressure normal, breathing on 3 L oxygen by nasal  cannula  Assessment and plan: Acute on chronic respiratory failure with hypoxia -Multifactorial etiology of respiratory failure: Lung cancer, recurrent pneumonia, pulmonary edema, rib fracture  -See management of individual issues below -on 3 L oxygen at home.  Initially required 4 L.  Currently back to 2 L.  Acute pulmonary edema -Presented with shortness of breath, nonproductive cough, orthopnea.  Chest x-ray showed pulm edema revealing bilateral infiltrates, more on the left.  Patient was planned for IV Lasix.  However it seems that patient has been refusing it and has not received any Lasix at this visit. -Lung exam this morning sounds clear.  I do not think he needs any Lasix. -Most recent echocardiogram from 5/23 showed EF of 60 to 65%. -PTA on Cardizem 180 mg daily, lisinopril 20 mg daily  COPD exacerbation -No evidence of infiltrates.  Currently not short course of prednisone -Continue bronchodilators  Left seventh and eighth rib fractures -Anxiety and chest imaging in June.  Continues to report significant pain. -PTA on Flexeril, OxyContin, lidocaine patch.  Patient states he was not able to put lidocaine patch on himself.  -Continue all.  Start fentanyl 12 mcg/ 24-hour patch -Continue incentive spirometry, chest PT  Non-small cell cancer of left lung -Follows up with Dr. Julien Nordmann.  Note appreciated.  Recommended palliative care hospice but patient still wants to pursue aggressive care. -Outpatient follow-up. Home  Concern of suicidal ideation Major depression -EMS reported patient was initially voicing suicidal ideation upon arrival to his place of residence. -Currently not showing any intent of self-harm. -PTA on bupropion 300 mg daily, buspirone 15 mg daily Cymbalta 60 mg twice daily, Latuda 40 mg  daily, Trileptal 600 mg a.m. and 900 mg p.m., Neurontin 900 mg at bedtime, trazodone 50 mg nightly -Continue all for now.  Psych consulted.   BPH (benign prostatic  hyperplasia) -Continue Flomax   OSA (obstructive sleep apnea) -Noncompliant to CPAP uses nocturnal oxygen   History of pulmonary embolism -Continue Eliquis  History of vasculitis -Continue CellCept  Goals of care   Code Status: DNR    Mobility: Encourage ambulation.  PT eval ordered  Skin assessment:     Nutritional status:  Body mass index is 38.47 kg/m.  Nutrition Problem: Increased nutrient needs Etiology: chronic illness (adenocarcinoma of lung) Signs/Symptoms: estimated needs     Diet:  Diet Order             Diet 2 gram sodium Room service appropriate? Yes; Fluid consistency: Thin  Diet effective now                   DVT prophylaxis:   apixaban (ELIQUIS) tablet 5 mg   Antimicrobials: None Fluid: None Consultants: Palliative care Family Communication: None at bedside  Status is: Inpatient  Continue in-hospital care because: Inadequate pain control Level of care: Telemetry Medical   Dispo: The patient is from: Home              Anticipated d/c is to: Home with home health tomorrow hopefully              Patient currently is not medically stable to d/c.   Difficult to place patient No     Infusions:    Scheduled Meds:  apixaban  5 mg Oral BID   buPROPion  300 mg Oral Daily   busPIRone  15 mg Oral Daily   cycloSPORINE  1 drop Both Eyes BID   diltiazem  180 mg Oral Daily   feeding supplement  237 mL Oral BID BM   fentaNYL  1 patch Transdermal Q72H   furosemide  40 mg Intravenous BID   gabapentin  300 mg Oral BID   ipratropium-albuterol  3 mL Nebulization TID   lidocaine  2 patch Transdermal Q24H   lurasidone  40 mg Oral QPC supper   melatonin  6 mg Oral QHS   multivitamin with minerals  1 tablet Oral Daily   mycophenolate  1,500 mg Oral BID   OXcarbazepine  600 mg Oral Daily   And   OXcarbazepine  900 mg Oral QHS   oxyCODONE  20 mg Oral Q12H   predniSONE  50 mg Oral Q breakfast   tamsulosin  0.4 mg Oral Daily    PRN  meds: acetaminophen **OR** acetaminophen, ipratropium-albuterol, ondansetron **OR** ondansetron (ZOFRAN) IV, oxyCODONE, polyethylene glycol   Antimicrobials: Anti-infectives (From admission, onward)    None       Objective: Vitals:   09/24/21 0739 09/24/21 1350  BP: 137/87   Pulse: 90 94  Resp: 18 18  Temp: 98.7 F (37.1 C)   SpO2: 95%     Intake/Output Summary (Last 24 hours) at 09/24/2021 1634 Last data filed at 09/24/2021 1400 Gross per 24 hour  Intake 1140 ml  Output 3100 ml  Net -1960 ml   Filed Weights   09/22/21 1951 09/23/21 0453 09/24/21 0025  Weight: (!) 138.3 kg (!) 137.3 kg 135.9 kg   Weight change: -2.447 kg Body mass index is 38.47 kg/m.   Physical Exam: General exam: Pleasant, elderly Caucasian male.  Partially controlled pain Skin: No rashes, lesions or ulcers. HEENT: Atraumatic, normocephalic, no obvious bleeding  Lungs: Clear to auscultation bilaterally CVS: Regular rate and rhythm, no murmur GI/Abd soft, nontender, nondistended, bowel sound present CNS: Alert, awake, oriented x3 Psychiatry: Mood appropriate. Extremities: No pedal edema, no calf tenderness  Data Review: I have personally reviewed the laboratory data and studies available.  F/u labs ordered Unresulted Labs (From admission, onward)    None       Signed, Terrilee Croak, MD [facility] 09/24/2021

## 2021-09-25 DIAGNOSIS — J810 Acute pulmonary edema: Secondary | ICD-10-CM | POA: Diagnosis not present

## 2021-09-25 MED ORDER — IPRATROPIUM-ALBUTEROL 0.5-2.5 (3) MG/3ML IN SOLN
3.0000 mL | Freq: Two times a day (BID) | RESPIRATORY_TRACT | Status: DC
  Administered 2021-09-25: 3 mL via RESPIRATORY_TRACT
  Filled 2021-09-25 (×2): qty 3

## 2021-09-25 MED ORDER — HYDROXYZINE HCL 25 MG PO TABS
25.0000 mg | ORAL_TABLET | Freq: Two times a day (BID) | ORAL | Status: AC | PRN
  Administered 2021-09-25: 25 mg via ORAL
  Filled 2021-09-25: qty 1

## 2021-09-25 NOTE — Discharge Summary (Signed)
Physician Discharge Summary  Mario [name] XLK:[id number] DOB: October 20, 1950 DOA: 09/22/2021  PCP: [facility]  Admit date: 09/22/2021 Discharge date: 09/26/2021  Admitted From: Home Discharge disposition: Home with home health  Recommendations at discharge:  Judicious use of pain medicines Follow-up with oncology as an outpatient  Brief narrative: Mario [name] is a 71 y.o. 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 He has been hospitalized 6 times in last 4 months for respiratory failure due to pneumonia, pleural effusion.  In June, he was recommended SNF but went home.  Most recent hospitalization 6/26 to 7/10 during which he was treated for pneumonia, left pleural catheter was removed on 7/5.  Patient presented to the ED on 7/16 with complaint of progressively worsening shortness of breath for 2 days, nonproductive cough, severe left-sided chest pain at the site of known left seventh and eighth rib fracture.  Also reports orthopnea.  EMS also noted that patient was verbalizing suicidal ideations.  He was hence brought to the ED.  In the ED, patient was afebrile, heart rate in 90s, blood pressure in 140s, breathing on 3 L oxygen by nasal cannula Labs mostly unremarkable except for hemoglobin low at 8.6 Chest x-ray showed perihilar infiltrates greater on the left. Admitted to hospitalist service. Oncology service was consulted.  Recommended palliative care/hospice. Palliative care discussed with patient.  He still wants to pursue oncology follow-up  Subjective: Patient was seen and examined this morning.  Sitting up at the edge of the bed.  Not in distress.  Feels ready to go home today.   Anxiety controlled after hydroxyzine was started yesterday  Assessment and plan: Acute on chronic respiratory failure with hypoxia -Multifactorial etiology of respiratory failure: Lung cancer, recurrent pneumonia, pulmonary edema, rib fracture  -See management of individual issues below -on 3 L oxygen at home.  Initially required 4 L.  Currently back to 3 L/min.  Acute pulmonary edema -Presented with shortness of breath, nonproductive cough, orthopnea.  Chest x-ray showed pulm edema revealing bilateral infiltrates, more on the left.  Patient was planned for IV Lasix on admission.  However patient kept refusing it as it makes him urinate more. -Respiratory status stable. -Most recent echocardiogram from 5/23 showed EF of 60 to 65%. -PTA on Cardizem 180 mg daily, lisinopril 20 mg daily -Resume both  COPD exacerbation -No evidence of infiltrates.  Currently on a short course of prednisone -Continue bronchodilators  Left seventh and eighth rib fractures -Anxiety and chest imaging in June.  Continues to report significant pain. -PTA on Flexeril, OxyContin, lidocaine patch.  Patient states he was not able to put lidocaine patch on himself.  -Continue all.  Started fentanyl 12 mcg/ 24-hour patch -Continue incentive spirometry, chest PT  Non-small cell cancer of left lung -Follows up with Dr. Julien Nordmann.  Note appreciated.  Recommended palliative care hospice but patient still wants to pursue aggressive care. -Outpatient follow-up.  Concern of suicidal ideation Major depression -EMS reported patient was initially voicing suicidal ideation upon arrival to his place of residence. -Currently not showing any intent of self-harm. -PTA on bupropion 300 mg daily, buspirone 15 mg daily Cymbalta 60 mg twice daily, Latuda 40 mg daily, Trileptal 600 mg a.m. and 900 mg p.m., Neurontin 900 mg  at bedtime, trazodone 50 mg nightly -Continue as before  Significant anxiety -Felt better with Atarax.   Continue the same post discharge.  BPH (benign prostatic hyperplasia) -Continue Flomax   OSA (obstructive sleep apnea) -Noncompliant to CPAP uses nocturnal oxygen   History of pulmonary embolism -Continue Eliquis  History of vasculitis -Continue CellCept  Wounds:  - Incision (Closed) 08/23/21 Flank Left;Upper (Active)  Date First Assessed/Time First Assessed: 08/23/21 0800   Location: Flank  Location Orientation: Left;Upper  Present on Admission: (c) Yes    Assessments 08/24/2021  8:10 PM 09/06/2021  7:30 PM  Dressing Type -- Gauze (Comment)  Dressing -- Clean, Dry, Intact  Site / Wound Assessment Clean;Dry Clean;Dry  Drainage Amount -- None     No associated orders.    Discharge Exam:   Vitals:   09/25/21 1207 09/25/21 2005 09/26/21 0422 09/26/21 1104  BP: (!) 143/90 (!) 103/47 (!) 165/83 (!) 147/93  Pulse: (!) 102 85 85 94  Resp: 18 18 18 20   Temp:  98.5 F (36.9 C) 97.7 F (36.5 C) 99 F (37.2 C)  TempSrc:  Oral Oral Oral  SpO2: 95% 96% 95% 98%  Weight:   135.2 kg   Height:        Body mass index is 38.26 kg/m.   General exam: Pleasant, elderly Caucasian male.  Pain controlled.  Anxiety controlled Skin: No rashes, lesions or ulcers. HEENT: Atraumatic, normocephalic, no obvious bleeding Lungs: Clear to auscultation bilaterally CVS: Regular rate and rhythm, no murmur GI/Abd soft, nontender, nondistended, bowel sound present CNS: Alert, awake, oriented x3 Psychiatry: Mood appropriate. Extremities: No pedal edema, no calf tenderness  Follow ups:    Follow-up Information     [facility] Follow up.   Why: The home health agency will call you for the next home visit Contact information: [phone]        [facility] Follow up.   Contact information: [place] [address] [phone]         [facility] Follow up.   Contact information: [place] [address] [phone]                 Discharge Instructions:   Discharge Instructions     Call MD for:  difficulty breathing, headache or visual disturbances   Complete by: As directed    Call MD for:  extreme fatigue   Complete by: As directed    Call MD for:  hives   Complete by: As directed    Call MD for:  persistant dizziness or light-headedness   Complete by: As directed    Call MD for:  persistant nausea and vomiting   Complete by: As directed    Call MD for:  severe uncontrolled pain   Complete by: As directed    Call MD for:  temperature >100.4   Complete by: As directed    Consult to hospitalist   Complete by: As directed    Diet general   Complete by: As directed    Discharge instructions   Complete by: As directed    Recommendations at discharge:   Judicious use of pain medicines  Follow-up with oncology as an outpatient  General discharge instructions: Follow with [facility] in 7 days  Please request your PCP  to go over your hospital tests, procedures, radiology results at the follow up. Please get your medicines reviewed and adjusted.  Your PCP may  decide to repeat certain labs or tests as needed. Do not drive, operate heavy machinery, perform activities at heights, swimming or participation in water activities or provide baby sitting services if your were admitted for syncope or siezures until you have seen by Primary MD or a Neurologist and advised to do so again. [place] Controlled Substance Reporting System database was reviewed. Do not drive, operate heavy machinery, perform activities at heights, swim, participate in water activities or provide baby-sitting services while on medications for pain, sleep and mood until your outpatient physician has reevaluated you and advised to do so again.  You are strongly recommended to comply with the dose, frequency and duration of prescribed  medications. Activity: As tolerated with Full fall precautions use walker/cane & assistance as needed Avoid using any recreational substances like cigarette, tobacco, alcohol, or non-prescribed drug. If you experience worsening of your admission symptoms, develop shortness of breath, life threatening emergency, suicidal or homicidal thoughts you must seek medical attention immediately by calling 911 or calling your MD immediately  if symptoms less severe. You must read complete instructions/literature along with all the possible adverse reactions/side effects for all the medicines you take and that have been prescribed to you. Take any new medicine only after you have completely understood and accepted all the possible adverse reactions/side effects.  Wear Seat belts while driving. You were cared for by a hospitalist during your hospital stay. If you have any questions about your discharge medications or the care you received while you were in the hospital after you are discharged, you can call the unit and ask to speak with the hospitalist or the covering physician. Once you are discharged, your primary care physician will handle any further medical issues. Please note that NO REFILLS for any discharge medications will be authorized once you are discharged, as it is imperative that you return to your primary care physician (or establish a relationship with a primary care physician if you do not have one).   Increase activity slowly   Complete by: As directed        Discharge Medications:   Allergies as of 09/26/2021       Reactions   Demerol [meperidine] Nausea And Vomiting, Other (See Comments)   Made the patient "violently sick"   Zocor [simvastatin] Nausea And Vomiting, Other (See Comments)   Made him very jittery, also   Beet [beta Vulgaris] Nausea And Vomiting   Liver Nausea And Vomiting        Medication List     STOP taking these medications    lidocaine 5 % Commonly known  as: LIDODERM   metFORMIN 850 MG tablet Commonly known as: GLUCOPHAGE       TAKE these medications    albuterol 108 (90 Base) MCG/ACT inhaler Commonly known as: VENTOLIN HFA Inhale 2 puffs into the lungs every 6 (six) hours as needed for wheezing. What changed: Another medication with the same name was changed. Make sure you understand how and when to take each.   albuterol (2.5 MG/3ML) 0.083% nebulizer solution Commonly known as: PROVENTIL Inhale 3 mLs (2.5 mg total) by nebulization every 4 (four) hours as needed for wheezing or shortness of breath. What changed: when to take this   apixaban 5 MG Tabs tablet Commonly known as: ELIQUIS Take 5 mg by mouth 2 (two) times daily.   ascorbic acid 500 MG tablet Commonly known as: VITAMIN C Take 500 mg by mouth 2 (two) times daily.   b complex  vitamins capsule Take 1 capsule by mouth every morning.   buPROPion 300 MG 24 hr tablet Commonly known as: WELLBUTRIN XL Take 300 mg by mouth every morning.   busPIRone 15 MG tablet Commonly known as: BUSPAR Take 15 mg by mouth daily.   calcium-vitamin D 500-200 MG-UNIT tablet Commonly known as: OSCAL WITH D Take 1 tablet by mouth 2 (two) times daily with a meal.   carboxymethylcellulose 0.5 % Soln Commonly known as: REFRESH PLUS Place 1 drop into both eyes 4 (four) times daily as needed (dry eyes).   clobetasol ointment 0.05 % Commonly known as: TEMOVATE Apply 1 Application topically 2 (two) times daily as needed (vasculitis scars on feet and ankles).   clotrimazole-betamethasone cream Commonly known as: LOTRISONE Apply 1 application. topically 2 (two) times daily as needed (fungus).   cyclobenzaprine 10 MG tablet Commonly known as: FLEXERIL Take 30 mg by mouth at bedtime.   cycloSPORINE 0.05 % ophthalmic emulsion Commonly known as: RESTASIS Place 1 drop into both eyes 2 (two) times daily.   diltiazem 180 MG 24 hr capsule Commonly known as: CARDIZEM CD Take 180 mg by  mouth daily.   DULoxetine 60 MG capsule Commonly known as: CYMBALTA Take 1 capsule (60 mg total) by mouth 2 (two) times daily.   feeding supplement Liqd Take 237 mLs by mouth 3 (three) times daily between meals. What changed: when to take this   fentaNYL 12 MCG/HR Commonly known as: [place] 1 patch onto the skin every 3 (three) days. Start taking on: September 28, 2021   fluticasone 50 MCG/ACT nasal spray Commonly known as: FLONASE Place 2 sprays into both nostrils daily.   folic acid 1 MG tablet Commonly known as: FOLVITE Take 1 mg by mouth daily.   gabapentin 300 MG capsule Commonly known as: NEURONTIN Take 1 capsule (300 mg total) by mouth 2 (two) times daily. What changed:  how much to take when to take this   GARLIC OIL PO Take 1 capsule by mouth daily.   guaiFENesin 600 MG 12 hr tablet Commonly known as: MUCINEX Take 1 tablet (600 mg total) by mouth 2 (two) times daily as needed for cough.   hydrOXYzine 10 MG tablet Commonly known as: ATARAX Take 1 tablet (10 mg total) by mouth 2 (two) times daily as needed for anxiety (30).   lisinopril 20 MG tablet Commonly known as: ZESTRIL Take 20 mg by mouth every morning.   lurasidone 40 MG Tabs tablet Commonly known as: LATUDA Take 40 mg by mouth daily after supper.   melatonin 3 MG Tabs tablet Take 6 mg by mouth at bedtime.   methotrexate 2.5 MG tablet Commonly known as: RHEUMATREX Take 15 mg by mouth once a week. Caution:Chemotherapy. Protect from light.   Mometasone Furoate 200 MCG/ACT Aero Inhale 2 puffs into the lungs at bedtime as needed (shortness of breath/wheezing).   mycophenolate 250 MG capsule Commonly known as: CELLCEPT Take 1,500 mg by mouth 2 (two) times daily.   nitroGLYCERIN 0.4 MG SL tablet Commonly known as: NITROSTAT Place 1 tablet (0.4 mg total) under the tongue every 5 (five) minutes as needed for chest pain.   oxcarbazepine 600 MG tablet Commonly known as: TRILEPTAL Take 600  mg by mouth 3 (three) times daily.   oxyCODONE 15 mg 12 hr tablet Commonly known as: OXYCONTIN Take 15 mg by mouth daily as needed (pain). What changed: Another medication with the same name was removed. Continue taking this medication, and follow the directions  you see here.   oxyCODONE 5 MG immediate release tablet Commonly known as: Oxy IR/ROXICODONE Take 1-2 tablets (5-10 mg total) by mouth every 4 (four) hours as needed for severe pain. What changed: Another medication with the same name was removed. Continue taking this medication, and follow the directions you see here.   OXYGEN Inhale 2 L into the lungs continuous.   polyethylene glycol 17 g packet Commonly known as: MIRALAX / GLYCOLAX Take 17 g by mouth daily.   prednisoLONE acetate 1 % ophthalmic suspension Commonly known as: PRED FORTE Place 1 drop into the left eye every hour while awake.   predniSONE 50 MG tablet Commonly known as: DELTASONE Take 1 tablet (50 mg total) by mouth daily with breakfast for 5 days.   senna-docusate 8.6-50 MG tablet Commonly known as: Senokot-S Take 1 tablet by mouth 2 (two) times daily.   sodium chloride 0.65 % Soln nasal spray Commonly known as: OCEAN Place 2 sprays into both nostrils 4 (four) times daily as needed for congestion.   tamsulosin 0.4 MG Caps capsule Commonly known as: FLOMAX Take 0.8 mg by mouth daily.   Tiotropium Bromide-Olodaterol 2.5-2.5 MCG/ACT Aers Inhale 2 each into the lungs every morning. 2 puffs   traZODone 50 MG tablet Commonly known as: DESYREL Take 75 mg by mouth at bedtime.   VITAMIN A PO Take 1 capsule by mouth daily.   Vitamin D-3 25 MCG (1000 UT) Caps Take 1,000 Units by mouth 2 (two) times daily.         The results of significant diagnostics from this hospitalization (including imaging, microbiology, ancillary and laboratory) are listed below for reference.    Procedures and Diagnostic Studies:   DG Chest Port 1 View  Result  Date: 09/22/2021 CLINICAL DATA:  Shortness of breath EXAM: PORTABLE CHEST 1 VIEW COMPARISON:  09/17/2021 FINDINGS: Cardiac enlargement. Emphysematous changes in the lungs with perihilar interstitial changes most prominent on the left. This is likely edema. Small left pleural effusion. Similar appearance to previous study. IMPRESSION: Cardiac enlargement with perihilar infiltrates, greater on the left, and small left pleural effusion. Electronically Signed   By: Lucienne Capers M.D.   On: 09/22/2021 22:01     Labs:   Basic Metabolic Panel: Recent Labs  Lab 09/22/21 2132 09/23/21 0545 09/24/21 0220  NA 135 133* 137  K 4.6 4.5 4.1  CL 103 102 101  CO2 23 22 27   GLUCOSE 123* 146* 148*  BUN 11 11 13   CREATININE 0.93 1.06 0.98  CALCIUM 9.1 9.0 9.3  MG  --  1.9  --    GFR Estimated Creatinine Clearance: 104 mL/min (by C-G formula based on SCr of 0.98 mg/dL). Liver Function Tests: Recent Labs  Lab 09/22/21 2132 09/23/21 0545  AST 23 16  ALT 13 14  ALKPHOS 83 85  BILITOT 0.7 0.3  PROT 6.6 6.8  ALBUMIN 3.4* 3.7   No results for input(s): "LIPASE", "AMYLASE" in the last 168 hours. No results for input(s): "AMMONIA" in the last 168 hours. Coagulation profile Recent Labs  Lab 09/23/21 0545  INR 1.1    CBC: Recent Labs  Lab 09/22/21 2132 09/23/21 0545  WBC 8.2 13.4*  NEUTROABS 7.4 10.2*  HGB 8.6* 12.5*  HCT 29.5* 38.9*  MCV 98.3 84.2  PLT 175 483*   Cardiac Enzymes: No results for input(s): "CKTOTAL", "CKMB", "CKMBINDEX", "TROPONINI" in the last 168 hours. BNP: Invalid input(s): "POCBNP" CBG: No results for input(s): "GLUCAP" in the last 168 hours. D-Dimer  No results for input(s): "DDIMER" in the last 72 hours. Hgb A1c No results for input(s): "HGBA1C" in the last 72 hours. Lipid Profile No results for input(s): "CHOL", "HDL", "LDLCALC", "TRIG", "CHOLHDL", "LDLDIRECT" in the last 72 hours. Thyroid function studies No results for input(s): "TSH", "T4TOTAL",  "T3FREE", "THYROIDAB" in the last 72 hours.  Invalid input(s): "FREET3" Anemia work up No results for input(s): "VITAMINB12", "FOLATE", "FERRITIN", "TIBC", "IRON", "RETICCTPCT" in the last 72 hours. Microbiology Recent Results (from the past 240 hour(s))  Respiratory (~20 pathogens) panel by PCR     Status: None   Collection Time: 09/23/21  6:20 AM   Specimen: Nasopharyngeal Swab; Respiratory  Result Value Ref Range Status   Adenovirus NOT DETECTED NOT DETECTED Final   Coronavirus 229E NOT DETECTED NOT DETECTED Final    Comment: (NOTE) The Coronavirus on the Respiratory Panel, DOES NOT test for the novel  Coronavirus (2019 nCoV)    Coronavirus HKU1 NOT DETECTED NOT DETECTED Final   Coronavirus NL63 NOT DETECTED NOT DETECTED Final   Coronavirus OC43 NOT DETECTED NOT DETECTED Final   Metapneumovirus NOT DETECTED NOT DETECTED Final   Rhinovirus / Enterovirus NOT DETECTED NOT DETECTED Final   Influenza A NOT DETECTED NOT DETECTED Final   Influenza B NOT DETECTED NOT DETECTED Final   Parainfluenza Virus 1 NOT DETECTED NOT DETECTED Final   Parainfluenza Virus 2 NOT DETECTED NOT DETECTED Final   Parainfluenza Virus 3 NOT DETECTED NOT DETECTED Final   Parainfluenza Virus 4 NOT DETECTED NOT DETECTED Final   Respiratory Syncytial Virus NOT DETECTED NOT DETECTED Final   Bordetella pertussis NOT DETECTED NOT DETECTED Final   Bordetella Parapertussis NOT DETECTED NOT DETECTED Final   Chlamydophila pneumoniae NOT DETECTED NOT DETECTED Final   Mycoplasma pneumoniae NOT DETECTED NOT DETECTED Final    Comment: Performed at [facility], [address] [place], [place] [address]    Time coordinating discharge: 35 minutes  Signed: Laelia Angelo  [facility] 09/26/2021, 1:06 PM

## 2021-09-25 NOTE — Progress Notes (Signed)
PROGRESS NOTE  Mario [name]  DOB: 1950-05-06  PCP: [facility] JWJ:[id number]  DOA: 09/22/2021  LOS: 2 days  Hospital Day: 4  Brief narrative: Mario [name] is a 71 y.o. 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 He has been hospitalized 6 times in last 4 months for respiratory failure due to pneumonia, pleural effusion.  In June, he was recommended SNF but went home.  Most recent hospitalization 6/26 to 7/10 during which he was treated for pneumonia, left pleural catheter was removed on 7/5.  Patient presented to the ED on 7/16 with complaint of progressively worsening shortness of breath for 2 days, nonproductive cough, severe left-sided chest pain at the site of known left seventh and eighth rib fracture.  Also reports orthopnea.  EMS also noted that patient was verbalizing suicidal ideations.  He was hence brought to the ED.  In the ED, patient was afebrile, heart rate in 90s, blood pressure in 140s, breathing on 3 L oxygen by nasal cannula Labs mostly unremarkable except for hemoglobin low at 8.6 Chest x-ray showed perihilar infiltrates greater on the left. Admitted to hospitalist service. Oncology service was consulted.  Recommended palliative care/hospice. Palliative care discussed with patient.  He still wants to pursue oncology follow-up  Subjective: Patient was seen and examined this morning.  Sitting up at the edge of the bed.  Complains of inadequate pain control.  Also complains of severe anxiety.  He is feeling lost as his roommate is moving out.  He needs to move back to his old place and is in process of finding movers.  He does not have any family to ask for help.  He does  not feel safe and comfortable with discharge today.  Assessment and plan: Acute on chronic respiratory failure with hypoxia -Multifactorial etiology of respiratory failure: Lung cancer, recurrent pneumonia, pulmonary edema, rib fracture  -See management of individual issues below -on 3 L oxygen at home.  Initially required 4 L.  Currently back to 3L.  Acute pulmonary edema -Presented with shortness of breath, nonproductive cough, orthopnea.  Chest x-ray showed pulm edema revealing bilateral infiltrates, more on the left.  Patient was planned for IV Lasix on admission.  However patient kept refusing it as it makes him urinate more. -Respiratory status stable. -Most recent echocardiogram from 5/23 showed EF of 60 to 65%. -PTA on Cardizem 180 mg daily, lisinopril 20 mg daily -Resume both  COPD exacerbation -No evidence of infiltrates.  Currently on follow-up short course of prednisone -Continue bronchodilators  Left seventh and eighth rib fractures -Anxiety and chest imaging in June.  Continues to report significant pain. -PTA on Flexeril, OxyContin, lidocaine patch.  Patient states he was not able to put lidocaine patch on himself.  -Continue all.  Started fentanyl 12 mcg/ 24-hour patch -Continue incentive spirometry, chest PT  Non-small cell cancer of left lung -Follows up with Dr. Julien Nordmann.  Note appreciated.  Recommended palliative care hospice but patient still wants to pursue aggressive care. -Outpatient follow-up. Home  Concern of suicidal ideation Major depression -EMS reported patient was initially voicing suicidal ideation upon arrival to his place of residence. -Currently not showing any intent of self-harm. -PTA on bupropion 300 mg daily, buspirone 15 mg daily Cymbalta 60  mg twice daily, Latuda 40 mg daily, Trileptal 600 mg a.m. and 900 mg p.m., Neurontin 900 mg at bedtime, trazodone 50 mg nightly -Continue as before  Significant anxiety -Felt better after Atarax was  given last time.  Wants to repeat the same.  Ordered as needed.   BPH (benign prostatic hyperplasia) -Continue Flomax   OSA (obstructive sleep apnea) -Noncompliant to CPAP uses nocturnal oxygen   History of pulmonary embolism -Continue Eliquis  History of vasculitis -Continue CellCept  Goals of care   Code Status: DNR    Mobility: Encourage ambulation.  PT eval ordered  Skin assessment:     Nutritional status:  Body mass index is 38.17 kg/m.  Nutrition Problem: Increased nutrient needs Etiology: chronic illness (adenocarcinoma of lung) Signs/Symptoms: estimated needs     Diet:  Diet Order             Diet 2 gram sodium Room service appropriate? Yes; Fluid consistency: Thin  Diet effective now                   DVT prophylaxis:   apixaban (ELIQUIS) tablet 5 mg   Antimicrobials: None Fluid: None Consultants: Palliative care Family Communication: None at bedside  Status is: Inpatient  Continue in-hospital care because: Inadequate pain control, severe anxiety Level of care: Telemetry Medical   Dispo: The patient is from: Home              Anticipated d/c is to: Home with home health tomorrow hopefully              Patient currently is not medically stable to d/c.   Difficult to place patient No     Infusions:    Scheduled Meds:  apixaban  5 mg Oral BID   buPROPion  300 mg Oral Daily   busPIRone  15 mg Oral Daily   cycloSPORINE  1 drop Both Eyes BID   diltiazem  180 mg Oral Daily   feeding supplement  237 mL Oral BID BM   fentaNYL  1 patch Transdermal Q72H   gabapentin  300 mg Oral BID   ipratropium-albuterol  3 mL Nebulization BID   lidocaine  2 patch Transdermal Q24H   lurasidone  40 mg Oral QPC supper   melatonin  6 mg Oral QHS   multivitamin with minerals  1 tablet Oral Daily   mycophenolate  1,500 mg Oral BID   OXcarbazepine  600 mg Oral Daily   And   OXcarbazepine  900 mg Oral QHS   oxyCODONE  20 mg Oral Q12H   predniSONE  50  mg Oral Q breakfast   tamsulosin  0.4 mg Oral Daily    PRN meds: acetaminophen **OR** acetaminophen, hydrOXYzine, ipratropium-albuterol, ondansetron **OR** ondansetron (ZOFRAN) IV, oxyCODONE, polyethylene glycol   Antimicrobials: Anti-infectives (From admission, onward)    None       Objective: Vitals:   09/25/21 0842 09/25/21 0845  BP:  (!) 150/92  Pulse:  92  Resp:  18  Temp:  98.4 F (36.9 C)  SpO2: 100% 100%    Intake/Output Summary (Last 24 hours) at 09/25/2021 1157 Last data filed at 09/25/2021 0900 Gross per 24 hour  Intake 1200 ml  Output 2650 ml  Net -1450 ml   Filed Weights   09/23/21 0453 09/24/21 0025 09/25/21 0552  Weight: (!) 137.3 kg 135.9 kg 134.9 kg   Weight change: -1.046 kg Body mass index is 38.17 kg/m.   Physical Exam: General exam: Pleasant, elderly  Caucasian male.  Partially controlled pain Skin: No rashes, lesions or ulcers. HEENT: Atraumatic, normocephalic, no obvious bleeding Lungs: Clear to auscultation bilaterally CVS: Regular rate and rhythm, no murmur GI/Abd soft, nontender, nondistended, bowel sound present CNS: Alert, awake, oriented x3 Psychiatry: Anxious Extremities: No pedal edema, no calf tenderness  Data Review: I have personally reviewed the laboratory data and studies available.  F/u labs ordered Unresulted Labs (From admission, onward)    None       Signed, Terrilee Croak, MD [facility] 09/25/2021

## 2021-09-26 DIAGNOSIS — J810 Acute pulmonary edema: Secondary | ICD-10-CM | POA: Diagnosis not present

## 2021-09-26 MED ORDER — FENTANYL 12 MCG/HR TD PT72: 1.0000 | MEDICATED_PATCH | TRANSDERMAL | 0 refills | Status: AC

## 2021-09-26 MED ORDER — PREDNISONE 50 MG PO TABS: 50.0000 mg | ORAL_TABLET | Freq: Every day | ORAL | 0 refills | Status: DC

## 2021-09-26 MED ORDER — HYDROXYZINE HCL 10 MG PO TABS: 10.0000 mg | ORAL_TABLET | Freq: Two times a day (BID) | ORAL | 0 refills | Status: DC | PRN

## 2021-09-26 MED ORDER — GUAIFENESIN ER 600 MG PO TB12: 600.0000 mg | ORAL_TABLET | Freq: Two times a day (BID) | ORAL | 0 refills | Status: DC | PRN

## 2021-09-26 MED ORDER — ALUM & MAG HYDROXIDE-SIMETH 200-200-20 MG/5ML PO SUSP
15.0000 mL | Freq: Four times a day (QID) | ORAL | Status: DC | PRN
  Administered 2021-09-26: 15 mL via ORAL
  Filled 2021-09-26: qty 30

## 2021-09-26 NOTE — Care Management (Signed)
[facility] notified of DC and [facility] called for transport. Patient advised that he may receive a bill for [facility].

## 2021-09-28 ENCOUNTER — Inpatient Hospital Stay: Payer: No Typology Code available for payment source | Attending: Internal Medicine | Admitting: Internal Medicine

## 2021-09-29 ENCOUNTER — Emergency Department (HOSPITAL_COMMUNITY): Payer: No Typology Code available for payment source

## 2021-09-29 ENCOUNTER — Other Ambulatory Visit: Payer: Self-pay

## 2021-09-29 ENCOUNTER — Emergency Department (HOSPITAL_COMMUNITY)
Admission: EM | Admit: 2021-09-29 | Discharge: 2021-09-29 | Disposition: A | Discharge status: Home / Self Care | Payer: No Typology Code available for payment source | Attending: Emergency Medicine | Admitting: Emergency Medicine

## 2021-09-29 ENCOUNTER — Encounter (HOSPITAL_COMMUNITY): Payer: Self-pay

## 2021-09-29 DIAGNOSIS — E119 Type 2 diabetes mellitus without complications: Secondary | ICD-10-CM | POA: Diagnosis not present

## 2021-09-29 DIAGNOSIS — Z7901 Long term (current) use of anticoagulants: Secondary | ICD-10-CM | POA: Insufficient documentation

## 2021-09-29 DIAGNOSIS — I10 Essential (primary) hypertension: Secondary | ICD-10-CM | POA: Diagnosis not present

## 2021-09-29 DIAGNOSIS — X58XXXA Exposure to other specified factors, initial encounter: Secondary | ICD-10-CM | POA: Diagnosis not present

## 2021-09-29 DIAGNOSIS — J90 Pleural effusion, not elsewhere classified: Secondary | ICD-10-CM | POA: Insufficient documentation

## 2021-09-29 DIAGNOSIS — S2242XA Multiple fractures of ribs, left side, initial encounter for closed fracture: Secondary | ICD-10-CM | POA: Diagnosis not present

## 2021-09-29 DIAGNOSIS — K5900 Constipation, unspecified: Secondary | ICD-10-CM

## 2021-09-29 DIAGNOSIS — R0602 Shortness of breath: Secondary | ICD-10-CM

## 2021-09-29 DIAGNOSIS — J449 Chronic obstructive pulmonary disease, unspecified: Secondary | ICD-10-CM | POA: Insufficient documentation

## 2021-09-29 DIAGNOSIS — R1032 Left lower quadrant pain: Secondary | ICD-10-CM

## 2021-09-29 DIAGNOSIS — Z79899 Other long term (current) drug therapy: Secondary | ICD-10-CM | POA: Insufficient documentation

## 2021-09-29 DIAGNOSIS — K5732 Diverticulitis of large intestine without perforation or abscess without bleeding: Secondary | ICD-10-CM | POA: Diagnosis not present

## 2021-09-29 DIAGNOSIS — C3492 Malignant neoplasm of unspecified part of left bronchus or lung: Secondary | ICD-10-CM | POA: Diagnosis not present

## 2021-09-29 LAB — COMPREHENSIVE METABOLIC PANEL
ALT: 23 U/L (ref 0–44)
AST: 19 U/L (ref 15–41)
Albumin: 3.5 g/dL (ref 3.5–5.0)
Alkaline Phosphatase: 82 U/L (ref 38–126)
Anion gap: 10 (ref 5–15)
BUN: 18 mg/dL (ref 8–23)
CO2: 24 mmol/L (ref 22–32)
Calcium: 8.9 mg/dL (ref 8.9–10.3)
Chloride: 102 mmol/L (ref 98–111)
Creatinine, Ser: 1.05 mg/dL (ref 0.61–1.24)
GFR, Estimated: >60 mL/min (ref >60)
Glucose, Bld: 179 mg/dL — ABNORMAL HIGH (ref 70–99)
Potassium: 4.1 mmol/L (ref 3.5–5.1)
Sodium: 136 mmol/L (ref 135–145)
Total Bilirubin: 0.3 mg/dL (ref 0.3–1.2)
Total Protein: 6.6 g/dL (ref 6.5–8.1)

## 2021-09-29 LAB — CBC
HCT: 41 % (ref 39.0–52.0)
Hemoglobin: 13 g/dL (ref 13.0–17.0)
MCH: 26.6 pg (ref 26.0–34.0)
MCHC: 31.7 g/dL (ref 30.0–36.0)
MCV: 84 fL (ref 80.0–100.0)
Platelets: 480 10*3/uL — ABNORMAL HIGH (ref 150–400)
RBC: 4.88 MIL/uL (ref 4.22–5.81)
RDW: 15.8 % — ABNORMAL HIGH (ref 11.5–15.5)
WBC: 13.9 10*3/uL — ABNORMAL HIGH (ref 4.0–10.5)
nRBC: 0 % (ref 0.0–0.2)

## 2021-09-29 LAB — LIPASE, BLOOD: Lipase: 27 U/L (ref 11–51)

## 2021-09-29 MED ORDER — IOHEXOL 300 MG/ML  SOLN
100.0000 mL | Freq: Once | INTRAMUSCULAR | Status: AC | PRN
  Administered 2021-09-29: 100 mL via INTRAVENOUS

## 2021-09-29 MED ORDER — SODIUM CHLORIDE 0.9 % IV BOLUS
500.0000 mL | Freq: Once | INTRAVENOUS | Status: AC
  Administered 2021-09-29: 500 mL via INTRAVENOUS

## 2021-09-29 MED ORDER — OXYCODONE HCL 5 MG PO TABS
5.0000 mg | ORAL_TABLET | ORAL | Status: DC | PRN
  Administered 2021-09-29: 5 mg via ORAL
  Filled 2021-09-29: qty 1

## 2021-09-29 MED ORDER — SODIUM CHLORIDE 0.9 % IV SOLN: INTRAVENOUS | Status: DC

## 2021-09-29 NOTE — ED Provider Notes (Signed)
[facility] DEPT Provider Note   CSN: [id number] Arrival date & time: 09/29/21  1549     History  Chief Complaint  Patient presents with   Abdominal Pain   Shortness of Breath    Mario [name] is a 71 y.o. male.  Patient here today with a complaint of left lower quadrant abdominal pain.  Patient brought by EMS from home.  Patient also complaining of increased shortness of breath for the past 2 days.  Patient states he has been constipated but did have a small bowel movement yesterday.  Patient denies any dysuria blood in his stool blood in his bowel movements.  Has a history of COPD and stage IV lung cancer patient is on 3 L of oxygen at all times.  Temp on arrival was 97.9 heart rate was 106 respiration 17 oxygen sats on his 3 L 95% blood pressure 138/79.  Patient used to be followed by Dr. Earlie Server for hematology oncology for the lung cancer but he is now followed at [facility].  His primary care doctor is [facility].  Patient with a recent admission July 26 through July 30 and was discharged home.  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  History of PE on Eliquis and patient still taking that.  COPD on 3 L of oxygen type 2 diabetes hypertension chronic pain syndrome.  Has been hospitalized 6 times in the last 4 months for respiratory failure due to pneumonia and pleural effusion.  In July he was recommended for his nursing facility but insisted on going home.  Most recent hospitalization June 26 to July 10 treated for pneumonia and left pleural catheter was removed.  The most recent admission was acute on chronic respiratory failure with hypoxia multifocal and etiology.  Acute pulmonary edema COPD exacerbation known left seventh eighth rib fractures non-small cell cancer left  lung.  Patient presents here today with concerns for some abnormality in his abdomen or and also concerned about redeveloping pneumonia.       Home Medications Prior to Admission medications   Medication Sig Start Date End Date Taking? Authorizing Provider  albuterol (PROVENTIL) (2.5 MG/3ML) 0.083% nebulizer solution Inhale 3 mLs (2.5 mg total) by nebulization every 4 (four) hours as needed for wheezing or shortness of breath. Patient taking differently: Take 2.5 mg by nebulization every 6 (six) hours as needed for wheezing or shortness of breath. 08/04/20 09/24/21  Lavina Hamman, MD  albuterol (VENTOLIN HFA) 108 (90 Base) MCG/ACT inhaler Inhale 2 puffs into the lungs every 6 (six) hours as needed for wheezing. 06/02/20   [provider]  apixaban (ELIQUIS) 5 MG TABS tablet Take 5 mg by mouth 2 (two) times daily.    [provider]  b complex vitamins capsule Take 1 capsule by mouth every morning.    [provider]  buPROPion (WELLBUTRIN XL) 300 MG 24 hr tablet Take 300 mg by mouth every morning. 02/05/20   [provider]  busPIRone (BUSPAR) 15 MG tablet Take 15 mg by mouth daily.    [provider]  calcium-vitamin D (OSCAL WITH D) 500-200 MG-UNIT tablet Take 1 tablet by mouth 2 (two) times daily with a meal.    [provider]  carboxymethylcellulose (REFRESH PLUS) 0.5 % SOLN Place 1 drop into both eyes 4 (four) times daily as needed (  dry eyes).    [provider]  Cholecalciferol (VITAMIN D-3) 25 MCG (1000 UT) CAPS Take 1,000 Units by mouth 2 (two) times daily.    [provider]  clobetasol ointment (TEMOVATE) 7.35 % Apply 1 Application topically 2 (two) times daily as needed (vasculitis scars on feet and ankles).    [provider]  clotrimazole-betamethasone (LOTRISONE) cream Apply 1 application. topically 2 (two) times daily as needed (fungus). 07/06/21   Aline August, MD  cyclobenzaprine (FLEXERIL) 10 MG  tablet Take 30 mg by mouth at bedtime.    [provider]  cycloSPORINE (RESTASIS) 0.05 % ophthalmic emulsion Place 1 drop into both eyes 2 (two) times daily.    [provider]  diltiazem (CARDIZEM CD) 180 MG 24 hr capsule Take 180 mg by mouth daily.    [provider]  DULoxetine (CYMBALTA) 60 MG capsule Take 1 capsule (60 mg total) by mouth 2 (two) times daily. 07/21/17   Lavina Hamman, MD  feeding supplement (ENSURE ENLIVE / ENSURE PLUS) LIQD Take 237 mLs by mouth 3 (three) times daily between meals. Patient taking differently: Take 237 mLs by mouth 2 (two) times daily. 08/04/20   Lavina Hamman, MD  fentaNYL (DURAGESIC) 12 MCG/HR Place 1 patch onto the skin every 3 (three) days. 09/28/21 10/28/21  Terrilee Croak, MD  fluticasone (FLONASE) 50 MCG/ACT nasal spray Place 2 sprays into both nostrils daily.     [provider]  folic acid (FOLVITE) 1 MG tablet Take 1 mg by mouth daily.    [provider]  gabapentin (NEURONTIN) 300 MG capsule Take 1 capsule (300 mg total) by mouth 2 (two) times daily. Patient taking differently: Take 900 mg by mouth at bedtime. 07/06/21   Aline August, MD  GARLIC OIL PO Take 1 capsule by mouth daily.    [provider]  guaiFENesin (MUCINEX) 600 MG 12 hr tablet Take 1 tablet (600 mg total) by mouth 2 (two) times daily as needed for cough. 09/26/21 12/25/21  Terrilee Croak, MD  hydrOXYzine (ATARAX) 10 MG tablet Take 1 tablet (10 mg total) by mouth 2 (two) times daily as needed for anxiety (30). 09/26/21 11/25/21  Dahal, Marlowe Aschoff, MD  lisinopril (ZESTRIL) 20 MG tablet Take 20 mg by mouth every morning.    [provider]  lurasidone (LATUDA) 40 MG TABS tablet Take 40 mg by mouth daily after supper.    [provider]  Melatonin 3 MG TABS Take 6 mg by mouth at bedtime.    [provider]  methotrexate (RHEUMATREX) 2.5 MG tablet Take 15 mg by mouth once a week. Caution:Chemotherapy. Protect from  light. Patient not taking: Reported on 09/24/2021    [provider]  Mometasone Furoate 200 MCG/ACT AERO Inhale 2 puffs into the lungs at bedtime as needed (shortness of breath/wheezing).    [provider]  mycophenolate (CELLCEPT) 250 MG capsule Take 1,500 mg by mouth 2 (two) times daily.    [provider]  nitroGLYCERIN (NITROSTAT) 0.4 MG SL tablet Place 1 tablet (0.4 mg total) under the tongue every 5 (five) minutes as needed for chest pain. Patient not taking: Reported on 08/20/2021 07/03/19   Edwin Dada, MD  oxcarbazepine (TRILEPTAL) 600 MG tablet Take 600 mg by mouth 3 (three) times daily. 07/16/20   [provider]  oxyCODONE (OXY IR/ROXICODONE) 5 MG immediate release tablet Take 1-2 tablets (5-10 mg total) by mouth every 4 (four) hours as needed for severe pain. 09/13/21  Delora Fuel, MD  oxyCODONE (OXYCONTIN) 15 mg 12 hr tablet Take 15 mg by mouth daily as needed (pain).    [provider]  OXYGEN Inhale 2 L into the lungs continuous.    [provider]  polyethylene glycol (MIRALAX / GLYCOLAX) 17 g packet Take 17 g by mouth daily.    [provider]  prednisoLONE acetate (PRED FORTE) 1 % ophthalmic suspension Place 1 drop into the left eye every hour while awake.    [provider]  predniSONE (DELTASONE) 50 MG tablet Take 1 tablet (50 mg total) by mouth daily with breakfast for 5 days. 09/26/21 10/01/21  Dahal, Marlowe Aschoff, MD  senna-docusate (SENOKOT-S) 8.6-50 MG tablet Take 1 tablet by mouth 2 (two) times daily. 07/06/21   Aline August, MD  sodium chloride (OCEAN) 0.65 % SOLN nasal spray Place 2 sprays into both nostrils 4 (four) times daily as needed for congestion.    [provider]  tamsulosin (FLOMAX) 0.4 MG CAPS capsule Take 0.8 mg by mouth daily.    [provider]  Tiotropium Bromide-Olodaterol 2.5-2.5 MCG/ACT AERS Inhale 2 each into the lungs every morning. 2 puffs    [provider]  traZODone (DESYREL) 50 MG tablet Take 75 mg by mouth at bedtime.    [provider]  VITAMIN A PO Take 1 capsule by mouth daily.    [provider]  vitamin C (ASCORBIC ACID) 500 MG tablet Take 500 mg by mouth 2 (two) times daily.    [provider]      Allergies    Demerol [meperidine], Zocor [simvastatin], Beet [beta vulgaris], and Liver    Review of Systems   Review of Systems  Constitutional:  Negative for chills and fever.  HENT:  Negative for ear pain and sore throat.   Eyes:  Negative for pain and visual disturbance.  Respiratory:  Positive for shortness of breath. Negative for cough.   Cardiovascular:  Negative for chest pain and palpitations.  Gastrointestinal:  Positive for abdominal pain. Negative for vomiting.  Genitourinary:  Negative for dysuria and hematuria.  Musculoskeletal:  Negative for arthralgias and back pain.  Skin:  Negative for color change and rash.  Neurological:  Negative for seizures and syncope.  All other systems reviewed and are negative.   Physical Exam Updated Vital Signs BP 132/70   Pulse (!) 106   Temp 98 F (36.7 C)   Resp (!) 23   Ht 1.88 m (6\' 2" )   Wt 135.2 kg   SpO2 97%   BMI 38.26 kg/m  Physical Exam Vitals and nursing note reviewed.  Constitutional:      General: He is not in acute distress.    Appearance: He is well-developed. He is not ill-appearing.  HENT:     Head: Normocephalic and atraumatic.  Eyes:     Conjunctiva/sclera: Conjunctivae normal.  Cardiovascular:     Rate and Rhythm: Normal rate and regular rhythm.     Heart sounds: No murmur heard. Pulmonary:     Effort: Pulmonary effort is normal. No respiratory distress.     Breath sounds: Normal breath sounds. No wheezing, rhonchi or rales.     Comments: Patient with healing incisions to the left lower part of the lung field where he had the pleural Dex catheters.  No evidence of secondary infection Abdominal:      General: Bowel sounds are normal.     Palpations: Abdomen is soft.     Tenderness: There is  no abdominal tenderness.     Hernia: No hernia is present.  Musculoskeletal:        General: No swelling.     Cervical back: Neck supple.  Skin:    General: Skin is warm and dry.     Capillary Refill: Capillary refill takes less than 2 seconds.  Neurological:     General: No focal deficit present.     Mental Status: He is alert and oriented to person, place, and time.  Psychiatric:        Mood and Affect: Mood normal.     ED Results / Procedures / Treatments   Labs (all labs ordered are listed, but only abnormal results are displayed) Labs Reviewed  COMPREHENSIVE METABOLIC PANEL - Abnormal; Notable for the following components:      Result Value   Glucose, Bld 179 (*)    All other components within normal limits  CBC - Abnormal; Notable for the following components:   WBC 13.9 (*)    RDW 15.8 (*)    Platelets 480 (*)    All other components within normal limits  LIPASE, BLOOD  URINALYSIS, ROUTINE W REFLEX MICROSCOPIC    EKG EKG Interpretation  Date/Time:  Wednesday September 29 2021 16:21:32 EDT Ventricular Rate:  107 PR Interval:  168 QRS Duration: 101 QT Interval:  346 QTC Calculation: 462 R Axis:   -2 Text Interpretation: Sinus tachycardia Multiple ventricular premature complexes Low voltage, precordial leads Baseline wander No significant change since last tracing Confirmed by Fredia Sorrow [phone]) on 09/29/2021 4:35:18 PM  Radiology CT Chest W Contrast  Result Date: 09/29/2021 CLINICAL DATA:  Left lower quadrant abdominal pain and dyspnea beginning 2 days ago EXAM: CT CHEST, ABDOMEN, AND PELVIS WITH CONTRAST TECHNIQUE: Multidetector CT imaging of the chest, abdomen and pelvis was performed following the standard protocol during bolus administration of intravenous contrast. RADIATION DOSE REDUCTION: This exam was performed according to the departmental dose-optimization  program which includes automated exposure control, adjustment of the mA and/or kV according to patient size and/or use of iterative reconstruction technique. CONTRAST:  162mL OMNIPAQUE IOHEXOL 300 MG/ML  SOLN COMPARISON:  CT 09/13/2021 and 08/23/2021 FINDINGS: CT CHEST FINDINGS Cardiovascular: Normal heart size. No pericardial effusion. Coronary artery and aortic atherosclerotic calcification. Mediastinum/Nodes: No thoracic adenopathy by size. Central airways are patent. Normal thyroid. Normal esophagus. Lungs/Pleura: Advanced emphysema. The air component of the previous small loculated hydropneumothorax seen on 09/13/2021 is no longer visualized. There remains a small loculated left pleural effusion. Adjacent left lower lobe and lingula volume loss and consolidation. Interlobular and peribronchovascular thickening in the left lower lobe. Bullous emphysema the right lung base. Musculoskeletal: Redemonstrated mildly displaced subacute rib fractures of the left 8th-9th ribs laterally. CT ABDOMEN PELVIS FINDINGS Hepatobiliary: No suspicious focal liver abnormality is seen. No gallstones, gallbladder wall thickening, or biliary dilatation. Pancreas: Unremarkable. No pancreatic ductal dilatation or surrounding inflammatory changes. Spleen: Normal in size without focal abnormality. Adrenals/Urinary Tract: Adrenal glands are unremarkable. Kidneys are normal, without renal calculi, suspicious focal lesion, or hydronephrosis. Bladder is unremarkable. Stomach/Bowel: Unremarkable stomach. Normal caliber large and small bowel large colonic stool burden. Colonic diverticulosis without evidence diverticulitis. Normal appendix. Vascular/Lymphatic: Aortic atherosclerosis. No enlarged abdominal or pelvic lymph nodes. Reproductive: Prostate is unremarkable. Other: No free intraperitoneal fluid or air. Musculoskeletal: Redemonstrated mildly displaced subacute rib fractures of the left 8th-9th ribs laterally. IMPRESSION: 1. Small  partially loculated left pleural effusion. The air component of the previous hydropneumothorax has resolved. 2. Left lower lobe  consolidation adjacent to the pleural effusion possibly infectious or inflammatory in nature. 3. Subacute minimally displaced fractures of the left eighth and ninth ribs laterally, unchanged. 4. Extensive coronary artery calcification. 5. Large colonic stool burden. Recommend clinical correlation for constipation. 6. Sigmoid diverticulosis without evidence of diverticulitis. Electronically Signed   By: Placido Sou M.D.   On: 09/29/2021 19:22   CT Abdomen Pelvis W Contrast  Result Date: 09/29/2021 CLINICAL DATA:  Left lower quadrant abdominal pain and dyspnea beginning 2 days ago EXAM: CT CHEST, ABDOMEN, AND PELVIS WITH CONTRAST TECHNIQUE: Multidetector CT imaging of the chest, abdomen and pelvis was performed following the standard protocol during bolus administration of intravenous contrast. RADIATION DOSE REDUCTION: This exam was performed according to the departmental dose-optimization program which includes automated exposure control, adjustment of the mA and/or kV according to patient size and/or use of iterative reconstruction technique. CONTRAST:  148mL OMNIPAQUE IOHEXOL 300 MG/ML  SOLN COMPARISON:  CT 09/13/2021 and 08/23/2021 FINDINGS: CT CHEST FINDINGS Cardiovascular: Normal heart size. No pericardial effusion. Coronary artery and aortic atherosclerotic calcification. Mediastinum/Nodes: No thoracic adenopathy by size. Central airways are patent. Normal thyroid. Normal esophagus. Lungs/Pleura: Advanced emphysema. The air component of the previous small loculated hydropneumothorax seen on 09/13/2021 is no longer visualized. There remains a small loculated left pleural effusion. Adjacent left lower lobe and lingula volume loss and consolidation. Interlobular and peribronchovascular thickening in the left lower lobe. Bullous emphysema the right lung base. Musculoskeletal:  Redemonstrated mildly displaced subacute rib fractures of the left 8th-9th ribs laterally. CT ABDOMEN PELVIS FINDINGS Hepatobiliary: No suspicious focal liver abnormality is seen. No gallstones, gallbladder wall thickening, or biliary dilatation. Pancreas: Unremarkable. No pancreatic ductal dilatation or surrounding inflammatory changes. Spleen: Normal in size without focal abnormality. Adrenals/Urinary Tract: Adrenal glands are unremarkable. Kidneys are normal, without renal calculi, suspicious focal lesion, or hydronephrosis. Bladder is unremarkable. Stomach/Bowel: Unremarkable stomach. Normal caliber large and small bowel large colonic stool burden. Colonic diverticulosis without evidence diverticulitis. Normal appendix. Vascular/Lymphatic: Aortic atherosclerosis. No enlarged abdominal or pelvic lymph nodes. Reproductive: Prostate is unremarkable. Other: No free intraperitoneal fluid or air. Musculoskeletal: Redemonstrated mildly displaced subacute rib fractures of the left 8th-9th ribs laterally. IMPRESSION: 1. Small partially loculated left pleural effusion. The air component of the previous hydropneumothorax has resolved. 2. Left lower lobe consolidation adjacent to the pleural effusion possibly infectious or inflammatory in nature. 3. Subacute minimally displaced fractures of the left eighth and ninth ribs laterally, unchanged. 4. Extensive coronary artery calcification. 5. Large colonic stool burden. Recommend clinical correlation for constipation. 6. Sigmoid diverticulosis without evidence of diverticulitis. Electronically Signed   By: Placido Sou M.D.   On: 09/29/2021 19:22   DG Chest Portable 1 View  Result Date: 09/29/2021 CLINICAL DATA:  Shortness of breath EXAM: PORTABLE CHEST 1 VIEW COMPARISON:  Chest x-ray 09/22/2021 FINDINGS: There is a stable small left pleural effusion. There is some interstitial opacities in the lung bases. No pneumothorax. Cardiomediastinal silhouette is within normal  limits. No acute fractures are seen. IMPRESSION: 1. Stable small left pleural effusion. 2. Interstitial opacities in the lung bases may represent edema. Infection is not excluded. Electronically Signed   By: Ronney Asters M.D.   On: 09/29/2021 16:41    Procedures Procedures    Medications Ordered in ED Medications  0.9 %  sodium chloride infusion ( Intravenous New Bag/Given 09/29/21 1735)  oxyCODONE (Oxy IR/ROXICODONE) immediate release tablet 5 mg (5 mg Oral Given 09/29/21 1757)  sodium chloride 0.9 % bolus 500 mL (  0 mLs Intravenous Stopped 09/29/21 1932)  iohexol (OMNIPAQUE) 300 MG/ML solution 100 mL (100 mLs Intravenous Contrast Given 09/29/21 1850)    ED Course/ Medical Decision Making/ A&P                           Medical Decision Making Amount and/or Complexity of Data Reviewed Labs: ordered. Radiology: ordered.  Risk Prescription drug management.  Work-up here patient had CT chest abdomen and pelvis.  Did have regular chest x-ray without any acute findings.  Regular chest x-ray said stable small left pleural effusion interstitial opacities in the lung bases may represent edema or infection so went on to CT chest.  That showed small partially loculated pleural effusion the air component of the previous hydropneumothorax has resolved left lower lobe consolidation agent adjacent to the pleural effusion.  Possibly infectious or inflammatory in nature but not significantly changed.  Subacute minimally displaced fractures of the left eighth and ninth ribs large colonic stool burden.  Sigmoid diverticulosis no diverticulitis.  Patient's labs CBC white count of a little bit at 13,000 hemoglobin 13 platelets 557 complete metabolic panel without any acute findings liver function tests normal.  Blood sugar 179 CO2 24 no evidence of acidosis lipase normal.  Overall this seems to be essentially unchanged.  Patient currently nontoxic not in any respiratory distress oxygen sats are good.  No evidence  of any significant pneumonia or increased pleural effusion or pulmonary edema.  No acute process in the abdomen.  There is constipation which patient knew he had.  Recommend increasing his MiraLAX.  Patient feels better and would like to be discharged home.  He can follow-up [facility] and he is currently following with oncology at [facility].  No longer followed by Dr. Earlie Server.    Final Clinical Impression(s) / ED Diagnoses Final diagnoses:  Left lower quadrant abdominal pain  Shortness of breath  Constipation, unspecified constipation type  Malignant neoplasm of left lung, unspecified part of lung [facility])    Rx / DC Orders ED Discharge Orders     None         Fredia Sorrow, MD 09/29/21 2022

## 2021-09-29 NOTE — ED Notes (Signed)
Pt. Ambulates without difficulty to the bathroom with cane and portable oxygen tank.

## 2021-09-29 NOTE — Discharge Instructions (Signed)
Work-up here without any acute findings.  Pneumonia not completely ruled out but overall the CT of the chest looked unchanged from before.  Symptoms do not seem to be consistent with that currently.  CT of the abdomen did show constipation would recommend taking MiraLAX twice a day.  And following up with your doctors.  Continue to use your oxygen at home.  Return for any new or worse symptoms.  Continue to follow-up with [facility] for your oncology treatments.

## 2021-09-29 NOTE — ED Triage Notes (Signed)
Patient BIBA from home. Patient c/o left lower abdominal pain and SHOB beginning two days ago. Patient also stated he has been constipated but had a small BM on yesterday. Patient denies burning upon urination and denies blood in stool and urine. Patient has a hx of COPD and stage IV lung cancer.  Patient on 3L oxygen at baseline. Vitals were:  126/71 96% on 3L

## 2021-09-29 NOTE — ED Notes (Signed)
Patient has called out for the 3rd time stating "I need something to drink, my cramps are getting worse and I have chronic PNA".  Advised I still have to wait for orders from MD before giving any medications or anything to drink

## 2021-09-30 ENCOUNTER — Emergency Department (HOSPITAL_COMMUNITY)
Admission: EM | Admit: 2021-09-30 | Discharge: 2021-10-01 | Disposition: A | Discharge status: Home / Self Care | Payer: No Typology Code available for payment source | Attending: Emergency Medicine | Admitting: Emergency Medicine

## 2021-09-30 ENCOUNTER — Emergency Department (HOSPITAL_COMMUNITY): Payer: No Typology Code available for payment source

## 2021-09-30 ENCOUNTER — Encounter (HOSPITAL_COMMUNITY): Payer: Self-pay

## 2021-09-30 ENCOUNTER — Other Ambulatory Visit: Payer: Self-pay

## 2021-09-30 DIAGNOSIS — J449 Chronic obstructive pulmonary disease, unspecified: Secondary | ICD-10-CM | POA: Insufficient documentation

## 2021-09-30 DIAGNOSIS — R0789 Other chest pain: Secondary | ICD-10-CM | POA: Insufficient documentation

## 2021-09-30 DIAGNOSIS — F32A Depression, unspecified: Secondary | ICD-10-CM | POA: Diagnosis present

## 2021-09-30 DIAGNOSIS — F332 Major depressive disorder, recurrent severe without psychotic features: Secondary | ICD-10-CM | POA: Insufficient documentation

## 2021-09-30 DIAGNOSIS — Z7951 Long term (current) use of inhaled steroids: Secondary | ICD-10-CM | POA: Diagnosis not present

## 2021-09-30 DIAGNOSIS — R45851 Suicidal ideations: Secondary | ICD-10-CM | POA: Diagnosis not present

## 2021-09-30 DIAGNOSIS — R0602 Shortness of breath: Secondary | ICD-10-CM | POA: Insufficient documentation

## 2021-09-30 DIAGNOSIS — I129 Hypertensive chronic kidney disease with stage 1 through stage 4 chronic kidney disease, or unspecified chronic kidney disease: Secondary | ICD-10-CM | POA: Insufficient documentation

## 2021-09-30 DIAGNOSIS — E119 Type 2 diabetes mellitus without complications: Secondary | ICD-10-CM | POA: Diagnosis not present

## 2021-09-30 DIAGNOSIS — N1831 Chronic kidney disease, stage 3a: Secondary | ICD-10-CM | POA: Insufficient documentation

## 2021-09-30 DIAGNOSIS — Z85118 Personal history of other malignant neoplasm of bronchus and lung: Secondary | ICD-10-CM | POA: Insufficient documentation

## 2021-09-30 DIAGNOSIS — Z20822 Contact with and (suspected) exposure to covid-19: Secondary | ICD-10-CM | POA: Diagnosis not present

## 2021-09-30 DIAGNOSIS — Z7901 Long term (current) use of anticoagulants: Secondary | ICD-10-CM | POA: Insufficient documentation

## 2021-09-30 DIAGNOSIS — Z79899 Other long term (current) drug therapy: Secondary | ICD-10-CM | POA: Insufficient documentation

## 2021-09-30 DIAGNOSIS — R109 Unspecified abdominal pain: Secondary | ICD-10-CM | POA: Insufficient documentation

## 2021-09-30 DIAGNOSIS — F4325 Adjustment disorder with mixed disturbance of emotions and conduct: Secondary | ICD-10-CM | POA: Diagnosis not present

## 2021-09-30 LAB — CBC WITH DIFFERENTIAL/PLATELET
Abs Immature Granulocytes: 0.03 10*3/uL (ref 0.00–0.07)
Basophils Absolute: 0.1 10*3/uL (ref 0.0–0.1)
Basophils Relative: 0 %
Eosinophils Absolute: 0.2 10*3/uL (ref 0.0–0.5)
Eosinophils Relative: 2 %
HCT: 37.4 % — ABNORMAL LOW (ref 39.0–52.0)
Hemoglobin: 11.6 g/dL — ABNORMAL LOW (ref 13.0–17.0)
Immature Granulocytes: 0 %
Lymphocytes Relative: 16 %
Lymphs Abs: 1.9 10*3/uL (ref 0.7–4.0)
MCH: 26.3 pg (ref 26.0–34.0)
MCHC: 31 g/dL (ref 30.0–36.0)
MCV: 84.8 fL (ref 80.0–100.0)
Monocytes Absolute: 1.2 10*3/uL — ABNORMAL HIGH (ref 0.1–1.0)
Monocytes Relative: 11 %
Neutro Abs: 7.9 10*3/uL — ABNORMAL HIGH (ref 1.7–7.7)
Neutrophils Relative %: 71 %
Platelets: 394 10*3/uL (ref 150–400)
RBC: 4.41 MIL/uL (ref 4.22–5.81)
RDW: 15.7 % — ABNORMAL HIGH (ref 11.5–15.5)
WBC: 11.3 10*3/uL — ABNORMAL HIGH (ref 4.0–10.5)
nRBC: 0 % (ref 0.0–0.2)

## 2021-09-30 LAB — COMPREHENSIVE METABOLIC PANEL
ALT: 20 U/L (ref 0–44)
AST: 17 U/L (ref 15–41)
Albumin: 3.3 g/dL — ABNORMAL LOW (ref 3.5–5.0)
Alkaline Phosphatase: 71 U/L (ref 38–126)
Anion gap: 10 (ref 5–15)
BUN: 17 mg/dL (ref 8–23)
CO2: 25 mmol/L (ref 22–32)
Calcium: 8.4 mg/dL — ABNORMAL LOW (ref 8.9–10.3)
Chloride: 102 mmol/L (ref 98–111)
Creatinine, Ser: 0.91 mg/dL (ref 0.61–1.24)
GFR, Estimated: >60 mL/min (ref >60)
Glucose, Bld: 129 mg/dL — ABNORMAL HIGH (ref 70–99)
Potassium: 4.3 mmol/L (ref 3.5–5.1)
Sodium: 137 mmol/L (ref 135–145)
Total Bilirubin: 0.3 mg/dL (ref 0.3–1.2)
Total Protein: 6.3 g/dL — ABNORMAL LOW (ref 6.5–8.1)

## 2021-09-30 LAB — TROPONIN I (HIGH SENSITIVITY)
Troponin I (High Sensitivity): 7 ng/L (ref <18)
Troponin I (High Sensitivity): 7 ng/L (ref <18)

## 2021-09-30 LAB — PROTIME-INR
INR: 1.1 (ref 0.8–1.2)
Prothrombin Time: 13.8 seconds (ref 11.4–15.2)

## 2021-09-30 MED ORDER — APIXABAN 5 MG PO TABS
5.0000 mg | ORAL_TABLET | Freq: Two times a day (BID) | ORAL | Status: DC
  Administered 2021-10-01: 5 mg via ORAL
  Filled 2021-09-30: qty 1

## 2021-09-30 MED ORDER — GUAIFENESIN ER 600 MG PO TB12: 600.0000 mg | ORAL_TABLET | Freq: Two times a day (BID) | ORAL | Status: DC | PRN

## 2021-09-30 MED ORDER — CYCLOBENZAPRINE HCL 10 MG PO TABS
30.0000 mg | ORAL_TABLET | Freq: Every day | ORAL | Status: DC
  Administered 2021-09-30: 30 mg via ORAL
  Filled 2021-09-30: qty 3

## 2021-09-30 MED ORDER — BUSPIRONE HCL 10 MG PO TABS
15.0000 mg | ORAL_TABLET | Freq: Every day | ORAL | Status: DC
  Administered 2021-10-01: 15 mg via ORAL
  Filled 2021-09-30: qty 2

## 2021-09-30 MED ORDER — BUPROPION HCL ER (XL) 150 MG PO TB24
300.0000 mg | ORAL_TABLET | Freq: Every morning | ORAL | Status: DC
  Administered 2021-10-01: 300 mg via ORAL
  Filled 2021-09-30: qty 2

## 2021-09-30 MED ORDER — OXYCODONE HCL 5 MG PO TABS
5.0000 mg | ORAL_TABLET | ORAL | Status: DC | PRN
  Administered 2021-09-30 – 2021-10-01 (×2): 10 mg via ORAL
  Filled 2021-09-30 (×2): qty 2

## 2021-09-30 MED ORDER — OYSTER SHELL CALCIUM/D3 500-5 MG-MCG PO TABS
1.0000 | ORAL_TABLET | Freq: Two times a day (BID) | ORAL | Status: DC
  Administered 2021-10-01: 1 via ORAL
  Filled 2021-09-30 (×2): qty 1

## 2021-09-30 MED ORDER — ONDANSETRON HCL 4 MG/2ML IJ SOLN
4.0000 mg | Freq: Once | INTRAMUSCULAR | Status: AC
  Administered 2021-09-30: 4 mg via INTRAVENOUS
  Filled 2021-09-30: qty 2

## 2021-09-30 MED ORDER — FENTANYL CITRATE PF 50 MCG/ML IJ SOSY
75.0000 ug | PREFILLED_SYRINGE | Freq: Once | INTRAMUSCULAR | Status: AC
  Administered 2021-09-30: 75 ug via INTRAVENOUS
  Filled 2021-09-30: qty 2

## 2021-09-30 MED ORDER — PREDNISOLONE ACETATE 1 % OP SUSP
1.0000 [drp] | OPHTHALMIC | Status: DC
Start: 2021-10-01 — End: 2021-10-01
  Administered 2021-10-01 (×5): 1 [drp] via OPHTHALMIC
  Filled 2021-09-30: qty 5

## 2021-09-30 MED ORDER — ALBUTEROL SULFATE (2.5 MG/3ML) 0.083% IN NEBU: 2.5000 mg | INHALATION_SOLUTION | Freq: Four times a day (QID) | RESPIRATORY_TRACT | Status: DC | PRN

## 2021-09-30 MED ORDER — PREDNISONE 50 MG PO TABS
50.0000 mg | ORAL_TABLET | Freq: Every day | ORAL | Status: DC
  Administered 2021-10-01: 50 mg via ORAL
  Filled 2021-09-30: qty 1

## 2021-09-30 MED ORDER — ENSURE ENLIVE PO LIQD
237.0000 mL | Freq: Three times a day (TID) | ORAL | Status: DC
  Administered 2021-10-01: 237 mL via ORAL
  Filled 2021-09-30 (×4): qty 237

## 2021-09-30 MED ORDER — OXYCODONE HCL ER 15 MG PO T12A
15.0000 mg | EXTENDED_RELEASE_TABLET | Freq: Every day | ORAL | Status: DC | PRN
Start: 2021-09-30 — End: 2021-10-01

## 2021-09-30 MED ORDER — IOHEXOL 350 MG/ML SOLN
75.0000 mL | Freq: Once | INTRAVENOUS | Status: AC | PRN
  Administered 2021-09-30: 75 mL via INTRAVENOUS

## 2021-09-30 NOTE — ED Notes (Addendum)
Pt dressed out in a gown and blue disposable bottoms, d/t size. Pt has 2 personal belonging's bag, they're labeled. One bag contains his t-shirt, bottoms, hat, and a black zipper bag w/ his phone, eye drops, and inhalers in it. The other bag contains his shoes. Both bags placed in cabinets behind nursing station 9-12. Pt has a cane that is labeled. It is hanging off of the desk under cabinets rm 9-12. Security wanded pt

## 2021-09-30 NOTE — ED Provider Notes (Incomplete)
[facility] DEPT Provider Note  CSN: [id number] Arrival date & time: 09/30/21 1748  Chief Complaint(s) Shortness of Breath and Chest Pain  HPI Mario [name] is a 71 y.o. male with a past medical history listed below including COPD on 3 L nasal cannula, metastatic lung cancer with pleural effusions and pathologic rib fractures here for sudden onset shortness of breath and chest pain described as pressure.    No alleviating or aggravating factors.  Patient reports having had this pain in the past.  Pain lasted for 3 to 4 hours.   Patient was seen yesterday for shortness of breath and abdominal pain and had a reassuring work-up.  He did report moving to a new location 3 days ago and stating that this was a really bad position.  He states that in the move his psychiatric medications went missing.  Endorsing suicidal ideation stating that he cannot take his living situation anymore.  He states he has a plan to overdose on his Flexeril.  He reports prior suicide attempts in the past by asphyxiation.    The history is provided by the patient.    Past Medical History Past Medical History:  Diagnosis Date  . Anxiety   . Bronchitis   . COPD (chronic obstructive pulmonary disease) ([place])   . Depression   . History of radiation therapy    Left lung- 10/05/20-10/15/20- Dr. Gery Pray  . Hypertension   . lung ca 09/2020  . MI (myocardial infarction) ([place])    ????  . OSA (obstructive sleep apnea)   . Suicide attempt ([place])   . Tension pneumothorax 06/27/2016  . Uveitis    Patient Active Problem List   Diagnosis Date Noted  . Acute pulmonary edema ([place]) 09/23/2021  . BPH (benign prostatic hyperplasia) 09/23/2021  . Pulmonary edema cardiac cause ([place]) 09/23/2021  . Fracture of ribs, two, left, sequela 08/24/2021  . Pleural effusion, left 08/20/2021  . Metastatic carcinoma ([place]) 08/07/2021  . Non-small cell lung cancer, left ([place]) 08/07/2021  . Chronic  respiratory failure with hypoxia ([place]) 08/07/2021  . Rib pain 08/03/2021  . Encounter for antineoplastic chemotherapy 07/20/2021  . Leg paresthesia 06/17/2021  . Obesity, Class III, BMI 40-49.9 (morbid obesity) ([place]) 06/17/2021  . Pneumonia 06/17/2021  . Hypertension   . Allergic rhinitis   . History of pulmonary embolism   . Hyponatremia 05/20/2021  . Mood disorder ([place]) 02/03/2021  . Non-small cell cancer of left lung ([place]) 09/21/2020  . Lung nodule 07/30/2020  . Mass of left lung   . Constipation   . COPD with acute exacerbation ([place]) 01/05/2020  . Vertigo 12/26/2019  . 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 Stage 3a chronic kidney disease ([place]) 12/26/2019  . Abnormal CT of the chest 12/26/2019  . COPD (chronic obstructive pulmonary disease) ([place])   . Hyperglycemia 06/23/2019  . OSA (obstructive sleep apnea) 06/23/2019  . Suicidal ideation 06/23/2019  . Major depressive disorder, recurrent episode ([place]) 10/12/2018  . 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 Cellulitis of both feet 07/16/2017  . DM2 (diabetes mellitus, type 2) ([place]) 07/16/2017  . HLA B27 (HLA B27 positive) 07/16/2017  . Hyperkalemia   . Hypoxia   . Dyspnea 05/23/2014  . Malingering 01/21/2013  . Depression 01/11/2013  . Tobacco abuse 01/11/2013  . Obesity, unspecified 01/11/2013  . Hypertension associated with diabetes ([place]) 01/10/2013   Home Medication(s) Prior to Admission medications   Medication Sig Start Date End  Date Taking? Authorizing Provider  albuterol (PROVENTIL) (2.5 MG/3ML) 0.083% nebulizer solution Inhale 3 mLs (2.5 mg total) by nebulization every 4 (four) hours as needed for wheezing or shortness of breath. Patient taking differently: Take 2.5 mg by nebulization every 6 (six) hours as needed for wheezing or shortness of breath. 08/04/20 09/24/21  Lavina Hamman, MD  albuterol (VENTOLIN HFA) 108 (90 Base) MCG/ACT inhaler Inhale 2 puffs into the lungs every 6  (six) hours as needed for wheezing. 06/02/20   [provider]  apixaban (ELIQUIS) 5 MG TABS tablet Take 5 mg by mouth 2 (two) times daily.    [provider]  b complex vitamins capsule Take 1 capsule by mouth every morning.    [provider]  buPROPion (WELLBUTRIN XL) 300 MG 24 hr tablet Take 300 mg by mouth every morning. 02/05/20   [provider]  busPIRone (BUSPAR) 15 MG tablet Take 15 mg by mouth daily.    [provider]  calcium-vitamin D (OSCAL WITH D) 500-200 MG-UNIT tablet Take 1 tablet by mouth 2 (two) times daily with a meal.    [provider]  carboxymethylcellulose (REFRESH PLUS) 0.5 % SOLN Place 1 drop into both eyes 4 (four) times daily as needed (dry eyes).    [provider]  Cholecalciferol (VITAMIN D-3) 25 MCG (1000 UT) CAPS Take 1,000 Units by mouth 2 (two) times daily.    [provider]  clobetasol ointment (TEMOVATE) 4.17 % Apply 1 Application topically 2 (two) times daily as needed (vasculitis scars on feet and ankles).    [provider]  clotrimazole-betamethasone (LOTRISONE) cream Apply 1 application. topically 2 (two) times daily as needed (fungus). 07/06/21   Aline August, MD  cyclobenzaprine (FLEXERIL) 10 MG tablet Take 30 mg by mouth at bedtime.    [provider]  cycloSPORINE (RESTASIS) 0.05 % ophthalmic emulsion Place 1 drop into both eyes 2 (two) times daily.    [provider]  diltiazem (CARDIZEM CD) 180 MG 24 hr capsule Take 180 mg by mouth daily.    [provider]  DULoxetine (CYMBALTA) 60 MG capsule Take 1 capsule (60 mg total) by mouth 2 (two) times daily. 07/21/17   Lavina Hamman, MD  feeding supplement (ENSURE ENLIVE / ENSURE PLUS) LIQD Take 237 mLs by mouth 3 (three) times daily between meals. Patient taking differently: Take 237 mLs by mouth 2 (two) times daily. 08/04/20   Lavina Hamman, MD  fentaNYL (DURAGESIC) 12 MCG/HR Place 1 patch onto the  skin every 3 (three) days. 09/28/21 10/28/21  Terrilee Croak, MD  fluticasone (FLONASE) 50 MCG/ACT nasal spray Place 2 sprays into both nostrils daily.     [provider]  folic acid (FOLVITE) 1 MG tablet Take 1 mg by mouth daily.    [provider]  gabapentin (NEURONTIN) 300 MG capsule Take 1 capsule (300 mg total) by mouth 2 (two) times daily. Patient taking differently: Take 900 mg by mouth at bedtime. 07/06/21   Aline August, MD  GARLIC OIL PO Take 1 capsule by mouth daily.    [provider]  guaiFENesin (MUCINEX) 600 MG 12 hr tablet Take 1 tablet (600 mg total) by mouth 2 (two) times daily as needed for cough. 09/26/21 12/25/21  Terrilee Croak, MD  hydrOXYzine (ATARAX) 10 MG tablet Take 1 tablet (10 mg total) by mouth 2 (two) times daily as needed for anxiety (30). 09/26/21 11/25/21  Dahal, Marlowe Aschoff, MD  lisinopril (ZESTRIL) 20 MG tablet  Take 20 mg by mouth every morning.    [provider]  lurasidone (LATUDA) 40 MG TABS tablet Take 40 mg by mouth daily after supper.    [provider]  Melatonin 3 MG TABS Take 6 mg by mouth at bedtime.    [provider]  methotrexate (RHEUMATREX) 2.5 MG tablet Take 15 mg by mouth once a week. Caution:Chemotherapy. Protect from light. Patient not taking: Reported on 09/24/2021    [provider]  Mometasone Furoate 200 MCG/ACT AERO Inhale 2 puffs into the lungs at bedtime as needed (shortness of breath/wheezing).    [provider]  mycophenolate (CELLCEPT) 250 MG capsule Take 1,500 mg by mouth 2 (two) times daily.    [provider]  nitroGLYCERIN (NITROSTAT) 0.4 MG SL tablet Place 1 tablet (0.4 mg total) under the tongue every 5 (five) minutes as needed for chest pain. Patient not taking: Reported on 08/20/2021 07/03/19   Edwin Dada, MD  oxcarbazepine (TRILEPTAL) 600 MG tablet Take 600 mg by mouth 3 (three) times daily. 07/16/20   [provider]  oxyCODONE (OXY  IR/ROXICODONE) 5 MG immediate release tablet Take 1-2 tablets (5-10 mg total) by mouth every 4 (four) hours as needed for severe pain. 0/92/33   Delora Fuel, MD  oxyCODONE (OXYCONTIN) 15 mg 12 hr tablet Take 15 mg by mouth daily as needed (pain).    [provider]  OXYGEN Inhale 2 L into the lungs continuous.    [provider]  polyethylene glycol (MIRALAX / GLYCOLAX) 17 g packet Take 17 g by mouth daily.    [provider]  prednisoLONE acetate (PRED FORTE) 1 % ophthalmic suspension Place 1 drop into the left eye every hour while awake.    [provider]  predniSONE (DELTASONE) 50 MG tablet Take 1 tablet (50 mg total) by mouth daily with breakfast for 5 days. 09/26/21 10/01/21  Dahal, Marlowe Aschoff, MD  senna-docusate (SENOKOT-S) 8.6-50 MG tablet Take 1 tablet by mouth 2 (two) times daily. 07/06/21   Aline August, MD  sodium chloride (OCEAN) 0.65 % SOLN nasal spray Place 2 sprays into both nostrils 4 (four) times daily as needed for congestion.    [provider]  tamsulosin (FLOMAX) 0.4 MG CAPS capsule Take 0.8 mg by mouth daily.    [provider]  Tiotropium Bromide-Olodaterol 2.5-2.5 MCG/ACT AERS Inhale 2 each into the lungs every morning. 2 puffs    [provider]  traZODone (DESYREL) 50 MG tablet Take 75 mg by mouth at bedtime.    [provider]  VITAMIN A PO Take 1 capsule by mouth daily.    [provider]  vitamin C (ASCORBIC ACID) 500 MG tablet Take 500 mg by mouth 2 (two) times daily.    [provider]  Allergies Demerol [meperidine], Zocor [simvastatin], Beet [beta vulgaris], and Liver  Review of Systems Review of Systems As noted in HPI  Physical Exam Vital Signs  I have reviewed the triage vital signs BP 116/66   Pulse 97   Temp 98.4 F (36.9 C) (Oral)   Resp  20   Ht '6\' 2"'  (1.88 m)   Wt 135.2 kg   SpO2 96%   BMI 38.27 kg/m   Physical Exam Vitals reviewed.  Constitutional:      General: He is not in acute distress.    Appearance: He is well-developed. He is not diaphoretic.  HENT:     Head: Normocephalic and atraumatic.     Nose: Nose normal.  Eyes:     General: No scleral icterus.       Right eye: No discharge.        Left eye: No discharge.     Conjunctiva/sclera: Conjunctivae normal.     Pupils: Pupils are equal, round, and reactive to light.  Cardiovascular:     Rate and Rhythm: Normal rate and regular rhythm.     Heart sounds: No murmur heard.    No friction rub. No gallop.  Pulmonary:     Effort: Pulmonary effort is normal. No respiratory distress.     Breath sounds: Normal breath sounds. No stridor. No rales.  Abdominal:     General: There is no distension.     Palpations: Abdomen is soft.     Tenderness: There is no abdominal tenderness.  Musculoskeletal:        General: No tenderness.     Cervical back: Normal range of motion and neck supple.  Skin:    General: Skin is warm and dry.     Findings: No erythema or rash.  Neurological:     Mental Status: He is alert and oriented to person, place, and time.     ED Results and Treatments Labs (all labs ordered are listed, but only abnormal results are displayed) Labs Reviewed  COMPREHENSIVE METABOLIC PANEL - Abnormal; Notable for the following components:      Result Value   Glucose, Bld 129 (*)    Calcium 8.4 (*)    Total Protein 6.3 (*)    Albumin 3.3 (*)    All other components within normal limits  CBC WITH DIFFERENTIAL/PLATELET - Abnormal; Notable for the following components:   WBC 11.3 (*)    Hemoglobin 11.6 (*)    HCT 37.4 (*)    RDW 15.7 (*)    Neutro Abs 7.9 (*)    Monocytes Absolute 1.2 (*)    All other components within normal limits  RESP PANEL BY RT-PCR (FLU A&B, COVID) [id number]  PROTIME-INR  TROPONIN I (HIGH SENSITIVITY)  TROPONIN I (HIGH  SENSITIVITY)                                                                                                                         EKG  EKG Interpretation  Date/Time:  Thursday September 30 2021 18:19:26 EDT Ventricular Rate:  94 PR Interval:  185 QRS Duration: 108 QT Interval:  342 QTC Calculation: 428 R Axis:   -2 Text Interpretation: Sinus rhythm Confirmed by Addison Lank [phone]) on 10/01/2021 12:00:40 AM       Radiology CT Angio Chest PE W and/or Wo Contrast  Result Date: 09/30/2021 CLINICAL DATA:  Shortness of breath and chest pain. EXAM: CT ANGIOGRAPHY CHEST WITH CONTRAST TECHNIQUE: Multidetector CT imaging of the chest was performed using the standard protocol during bolus administration of intravenous contrast. Multiplanar CT image reconstructions and MIPs were obtained to evaluate the vascular anatomy. RADIATION DOSE REDUCTION: This exam was performed according to the departmental dose-optimization program which includes automated exposure control, adjustment of the mA and/or kV according to patient size and/or use of iterative reconstruction technique. CONTRAST:  57m OMNIPAQUE IOHEXOL 350 MG/ML SOLN COMPARISON:  Chest CT 09/29/2021 FINDINGS: Cardiovascular: Satisfactory opacification of the pulmonary arteries to the segmental level. No evidence of pulmonary embolism. Normal heart size. No pericardial effusion. There is calcified and noncalcified atherosclerotic disease in the aorta, unchanged. Mediastinum/Nodes: No enlarged mediastinal, hilar, or axillary lymph nodes. Thyroid gland, trachea, and esophagus demonstrate no significant findings. Lungs/Pleura: There is a small partially loculated left pleural effusion in the inferior left hemithorax, unchanged from the prior examination. Severe emphysematous changes are again seen. Patchy airspace opacity in the right lower lobe adjacent to the pleural effusion has not significantly changed. There is no new focal lung infiltrate. There are  minimal secretions in the mainstem bronchi without occlusion. There is no evidence for pneumothorax. Upper Abdomen: No acute abnormality. Musculoskeletal: Left seventh and eighth rib fractures appear unchanged from prior. Multilevel degenerative changes affect the spine. Review of the MIP images confirms the above findings. IMPRESSION: 1. No evidence for pulmonary embolism. 2. No significant interval change in partially loculated left pleural effusion with adjacent left lower lobe airspace disease, likely pneumonia. 3. Stable subacute left seventh and eighth rib fractures. 4. Minimal secretions within the bilateral mainstem bronchi, nonocclusive. 5. Aortic Atherosclerosis (ICD10-I70.0) and Emphysema (ICD10-J43.9). Electronically Signed   By: ARonney AstersM.D.   On: 09/30/2021 21:48   DG Chest 2 View  Result Date: 09/30/2021 CLINICAL DATA:  Chest pain EXAM: CHEST - 2 VIEW COMPARISON:  Radiograph 09/29/2021, chest CT 09/29/2021 FINDINGS: Unchanged cardiomediastinal silhouette. Persistent small left pleural effusion and adjacent left basilar opacities. Emphysema. Right basilar subsegmental atelectasis. No visible pneumothorax. Bones are unchanged. IMPRESSION: Persistent small left pleural effusion with adjacent basilar opacities, could be atelectasis or infection. Right basilar subsegmental atelectasis. Electronically Signed   By: JMaurine SimmeringM.D.   On: 09/30/2021 21:28    Pertinent labs & imaging results that were available during my care of the patient were reviewed by me and considered in my medical decision making (see MDM for details).  Medications Ordered in ED Medications  albuterol (PROVENTIL) (2.5 MG/3ML) 0.083% nebulizer solution 2.5 mg (has no administration in time range)  apixaban (ELIQUIS) tablet 5 mg (0 mg Oral Hold 10/01/21 0000)  buPROPion (WELLBUTRIN XL) 24 hr tablet 300 mg (has no administration in time range)  busPIRone (BUSPAR) tablet 15 mg (has no administration in time range)   calcium-vitamin D (OSCAL WITH D) 500-200 MG-UNIT per tablet 1 tablet (has no administration in time range)  cyclobenzaprine (FLEXERIL) tablet 30 mg (30 mg Oral Given 09/30/21 2359)  feeding supplement (ENSURE ENLIVE / ENSURE PLUS) liquid 237 mL (has no administration in time range)  guaiFENesin (MUCINEX) 12 hr tablet 600 mg (has no administration in time range)  oxyCODONE (Oxy IR/ROXICODONE) immediate release tablet 5-10 mg (10 mg Oral Given 09/30/21 2359)  oxyCODONE (OXYCONTIN) 12 hr tablet 15 mg (has no administration in time range)  prednisoLONE acetate (PRED FORTE) 1 % ophthalmic suspension 1 drop (has no administration in time range)  predniSONE (DELTASONE) tablet 50 mg (has no administration in time range)  fentaNYL (SUBLIMAZE) injection 75 mcg (75 mcg Intravenous Given 09/30/21 2045)  ondansetron (ZOFRAN) injection 4 mg (4 mg Intravenous Given 09/30/21 2046)  iohexol (OMNIPAQUE) 350 MG/ML injection 75 mL (75 mLs Intravenous Contrast Given 09/30/21 2129)                                                                                                                                     Procedures Procedures  (including critical care time)  Medical Decision Making / ED Course    Complexity of Problem:  Co-morbidities/SDOH that complicate the patient evaluation/care: Noted above in HPI  Patient's presenting problem/concern, DDX, and MDM listed below: Chest pain Atypical for ACS but given comorbidities, will obtain cardiac work-up Patient reports that he is compliant with his blood pressure medication side low suspicion for PE but given the sudden onset of the symptoms, will obtain a CT a to rule it out Low suspicion for aortic dissection or esophageal perforation Suicide ideation Patient is high risk given history of cancer and prior attempts   Hospitalization Considered:  ***  Initial Intervention:  ***    Complexity of Data:   Cardiac Monitoring: ***  Laboratory Tests  ordered listed below with my independent interpretation: ***   Imaging Studies ordered listed below with my independent interpretation: ***     ED Course:    Assessment, Add'l Intervention, and Reassessment: ***  ***  Final Clinical Impression(s) / ED Diagnoses Final diagnoses:  None    {Document critical care time when appropriate:1}  {Document review of labs and clinical decision tools ie heart score, Chads2Vasc2 etc:1}  {Document your independent review of radiology images, and any outside records:1} {Document your discussion with family members, caretakers, and with consultants:1} {Document social determinants of health affecting pt's care:1} {Document your decision making why or why not admission, treatments were needed:1} This chart was dictated using voice recognition software.  Despite best efforts to proofread,  errors can occur which can change the documentation meaning.

## 2021-09-30 NOTE — ED Notes (Signed)
Patient here yesterday had full work up and back due to sob and telling medics "make sure you let them know I am suicidal".  Patient was not suicidal yesterday and upset bc he was discharged home.

## 2021-09-30 NOTE — ED Triage Notes (Signed)
Pt bib ems from home. Pt states SOB, CP and SI. Pt states "I'm under a lot of stress, I just moved and it was a bad move, I feel like I'm using people."  Pt states his plan would be to od on muscle relaxer's. Pt was seen here yesterday.  Per ems, pt fired his cancer MD.

## 2021-09-30 NOTE — ED Provider Notes (Signed)
[facility] DEPT Provider Note  CSN: [id number] Arrival date & time: 09/30/21 1748  Chief Complaint(s) Shortness of Breath and Chest Pain  HPI Mario [name] is a 71 y.o. male with a past medical history listed below including COPD on 3 L nasal cannula, metastatic lung cancer with pleural effusions and pathologic rib fractures here for sudden onset shortness of breath and chest pain described as pressure.    No alleviating or aggravating factors.  Patient reports having had this pain in the past.  Pain lasted for 3 to 4 hours.   Patient was seen yesterday for shortness of breath and abdominal pain and had a reassuring work-up.  He did report moving to a new location 3 days ago and stating that this was a really bad position.  He states that in the move his psychiatric medications went missing.  Endorsing suicidal ideation stating that he cannot take his living situation anymore.  He states he has a plan to overdose on his Flexeril.  He reports prior suicide attempts in the past by asphyxiation.    The history is provided by the patient.    Past Medical History Past Medical History:  Diagnosis Date   Anxiety    Bronchitis    COPD (chronic obstructive pulmonary disease) ([place])    Depression    History of radiation therapy    Left lung- 10/05/20-10/15/20- Dr. Gery Pray   Hypertension    lung ca 09/2020   MI (myocardial infarction) [facility])    ????   OSA (obstructive sleep apnea)    Suicide attempt ([place])    Tension pneumothorax 06/27/2016   Uveitis    Patient Active Problem List   Diagnosis Date Noted   Acute pulmonary edema ([place]) 09/23/2021   BPH (benign prostatic hyperplasia) 09/23/2021   Pulmonary edema cardiac cause ([place]) 09/23/2021   Fracture of ribs, two, left, sequela 08/24/2021   Pleural effusion, left 08/20/2021   Metastatic carcinoma ([place]) 08/07/2021   Non-small cell lung cancer, left ([place]) 08/07/2021   Chronic respiratory failure  with hypoxia ([place]) 08/07/2021   Rib pain 08/03/2021   Encounter for antineoplastic chemotherapy 07/20/2021   Leg paresthesia 06/17/2021   Obesity, Class III, BMI 40-49.9 (morbid obesity) ([place]) 06/17/2021   Pneumonia 06/17/2021   Hypertension    Allergic rhinitis    History of pulmonary embolism    Hyponatremia 05/20/2021   Mood disorder ([place]) 02/03/2021   Non-small cell cancer of left lung ([place]) 09/21/2020   Lung nodule 07/30/2020   Mass of left lung    Constipation    COPD with acute exacerbation ([place]) 01/05/2020   Vertigo 12/26/2019   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   Stage 3a chronic kidney disease ([place]) 12/26/2019   Abnormal CT of the chest 12/26/2019   COPD (chronic obstructive pulmonary disease) ([place])    Hyperglycemia 06/23/2019   OSA (obstructive sleep apnea) 06/23/2019   Suicidal ideation 06/23/2019   Major depressive disorder, recurrent episode ([place]) 10/12/2018   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 Authorizing Provider  albuterol (PROVENTIL) (2.5 MG/3ML) 0.083% nebulizer solution Inhale 3 mLs (2.5 mg total) by nebulization every 4 (four) hours as needed for wheezing or shortness of breath. Patient taking differently: Take 2.5 mg by nebulization every 6 (six) hours as needed for wheezing or shortness of breath. 08/04/20 09/24/21  Lavina Hamman, MD  albuterol (VENTOLIN HFA) 108 (90 Base) MCG/ACT inhaler Inhale 2 puffs into the lungs every 6 (six) hours as needed for wheezing. 06/02/20   [provider]  apixaban (ELIQUIS) 5 MG TABS tablet Take 5 mg by mouth 2 (two) times daily.    [provider]  b complex vitamins capsule Take 1 capsule by mouth every morning.    [provider]  buPROPion (WELLBUTRIN XL) 300 MG 24 hr tablet Take 300 mg by mouth every morning. 02/05/20   [provider]  busPIRone (BUSPAR) 15 MG tablet Take 15 mg by mouth daily.    [provider]  calcium-vitamin D (OSCAL WITH D) 500-200 MG-UNIT tablet Take 1 tablet by mouth 2 (two) times daily with a meal.    [provider]  carboxymethylcellulose (REFRESH PLUS) 0.5 % SOLN Place 1 drop into both eyes 4 (four) times daily as needed (dry eyes).    [provider]  Cholecalciferol (VITAMIN D-3) 25 MCG (1000 UT) CAPS Take 1,000 Units by mouth 2 (two) times daily.    [provider]  clobetasol ointment (TEMOVATE) 2.01 % Apply 1 Application topically 2 (two) times daily as needed (vasculitis scars on feet and ankles).    [provider]  clotrimazole-betamethasone (LOTRISONE) cream Apply 1 application. topically 2 (two) times daily as needed (fungus). 07/06/21   Aline August, MD  cyclobenzaprine (FLEXERIL) 10 MG tablet Take 30 mg by mouth at bedtime.    [provider]  cycloSPORINE (RESTASIS) 0.05 % ophthalmic emulsion Place 1 drop into both eyes 2 (two) times daily.    [provider]  diltiazem (CARDIZEM CD) 180 MG 24 hr capsule Take 180 mg by mouth daily.    [provider]  DULoxetine (CYMBALTA) 60 MG capsule Take 1 capsule (60 mg total) by mouth 2 (two) times daily. 07/21/17   Lavina Hamman, MD  feeding supplement (ENSURE ENLIVE / ENSURE PLUS) LIQD Take 237 mLs by mouth 3 (three) times daily between meals. Patient taking differently: Take 237 mLs by mouth 2 (two) times daily. 08/04/20   Lavina Hamman, MD  fentaNYL (DURAGESIC) 12 MCG/HR Place 1 patch onto the skin every 3 (three) days. 09/28/21 10/28/21  Terrilee Croak, MD  fluticasone (FLONASE) 50 MCG/ACT nasal spray Place 2 sprays into both nostrils daily.     [provider]  folic acid (FOLVITE) 1 MG tablet Take 1 mg by mouth daily.    [provider]  gabapentin (NEURONTIN) 300 MG capsule Take 1 capsule (300 mg total) by mouth 2 (two) times daily. Patient taking differently: Take 900 mg by mouth at bedtime. 07/06/21   Aline August, MD  GARLIC OIL PO Take 1 capsule by mouth daily.    [provider]  guaiFENesin (MUCINEX) 600 MG 12 hr tablet Take 1 tablet (600 mg total) by mouth 2 (two) times daily as needed for cough. 09/26/21 12/25/21  Terrilee Croak, MD  hydrOXYzine (ATARAX) 10 MG tablet Take 1 tablet (10 mg total) by mouth 2 (two) times daily as needed for anxiety (30). 09/26/21 11/25/21  Dahal, Marlowe Aschoff, MD  lisinopril (ZESTRIL) 20 MG tablet  Take 20 mg by mouth every morning.    [provider]  lurasidone (LATUDA) 40 MG TABS tablet Take 40 mg by mouth daily after supper.    [provider]  Melatonin 3 MG TABS Take 6 mg by mouth at bedtime.    [provider]  methotrexate (RHEUMATREX) 2.5 MG tablet Take 15 mg by mouth once a week. Caution:Chemotherapy. Protect from light. Patient not taking: Reported on 09/24/2021    [provider]  Mometasone Furoate 200 MCG/ACT AERO Inhale 2 puffs into the lungs at bedtime as needed (shortness of breath/wheezing).    [provider]  mycophenolate (CELLCEPT) 250 MG capsule Take 1,500 mg by mouth 2 (two) times daily.    [provider]  nitroGLYCERIN (NITROSTAT) 0.4 MG SL tablet Place 1 tablet (0.4 mg total) under the tongue every 5 (five) minutes as needed for chest pain. Patient not taking: Reported on 08/20/2021 07/03/19   Edwin Dada, MD  oxcarbazepine (TRILEPTAL) 600 MG tablet Take 600 mg by mouth 3 (three) times daily. 07/16/20   [provider]  oxyCODONE (OXY IR/ROXICODONE) 5 MG immediate release tablet Take 1-2  tablets (5-10 mg total) by mouth every 4 (four) hours as needed for severe pain. 1/44/81   Delora Fuel, MD  oxyCODONE (OXYCONTIN) 15 mg 12 hr tablet Take 15 mg by mouth daily as needed (pain).    [provider]  OXYGEN Inhale 2 L into the lungs continuous.    [provider]  polyethylene glycol (MIRALAX / GLYCOLAX) 17 g packet Take 17 g by mouth daily.    [provider]  prednisoLONE acetate (PRED FORTE) 1 % ophthalmic suspension Place 1 drop into the left eye every hour while awake.    [provider]  predniSONE (DELTASONE) 50 MG tablet Take 1 tablet (50 mg total) by mouth daily with breakfast for 5 days. 09/26/21 10/01/21  Dahal, Marlowe Aschoff, MD  senna-docusate (SENOKOT-S) 8.6-50 MG tablet Take 1 tablet by mouth 2 (two) times daily. 07/06/21   Aline August, MD  sodium chloride (OCEAN) 0.65 % SOLN nasal spray Place 2 sprays into both nostrils 4 (four) times daily as needed for congestion.    [provider]  tamsulosin (FLOMAX) 0.4 MG CAPS capsule Take 0.8 mg by mouth daily.    [provider]  Tiotropium Bromide-Olodaterol 2.5-2.5 MCG/ACT AERS Inhale 2 each into the lungs every morning. 2 puffs    [provider]  traZODone (DESYREL) 50 MG tablet Take 75 mg by mouth at bedtime.    [provider]  VITAMIN A PO Take 1 capsule by mouth daily.    [provider]  vitamin C (ASCORBIC ACID) 500 MG tablet Take 500 mg by mouth 2 (two) times daily.    [provider]  Allergies Demerol [meperidine], Zocor [simvastatin], Beet [beta vulgaris], and Liver  Review of Systems Review of Systems As noted in HPI  Physical Exam Vital Signs  I have reviewed the triage vital signs BP 116/66   Pulse 97   Temp 98.4 F (36.9 C) (Oral)   Resp 20   Ht 6' 2" (1.88 m)   Wt 135.2 kg   SpO2 96%    BMI 38.27 kg/m   Physical Exam Vitals reviewed.  Constitutional:      General: He is not in acute distress.    Appearance: He is well-developed. He is not diaphoretic.  HENT:     Head: Normocephalic and atraumatic.     Nose: Nose normal.  Eyes:     General: No scleral icterus.       Right eye: No discharge.        Left eye: No discharge.     Conjunctiva/sclera: Conjunctivae normal.     Pupils: Pupils are equal, round, and reactive to light.  Cardiovascular:     Rate and Rhythm: Normal rate and regular rhythm.     Heart sounds: No murmur heard.    No friction rub. No gallop.  Pulmonary:     Effort: Pulmonary effort is normal. No respiratory distress.     Breath sounds: Normal breath sounds. No stridor. No rales.  Abdominal:     General: There is no distension.     Palpations: Abdomen is soft.     Tenderness: There is no abdominal tenderness.  Musculoskeletal:        General: No tenderness.     Cervical back: Normal range of motion and neck supple.  Skin:    General: Skin is warm and dry.     Findings: No erythema or rash.  Neurological:     Mental Status: He is alert and oriented to person, place, and time.     ED Results and Treatments Labs (all labs ordered are listed, but only abnormal results are displayed) Labs Reviewed  COMPREHENSIVE METABOLIC PANEL - Abnormal; Notable for the following components:      Result Value   Glucose, Bld 129 (*)    Calcium 8.4 (*)    Total Protein 6.3 (*)    Albumin 3.3 (*)    All other components within normal limits  CBC WITH DIFFERENTIAL/PLATELET - Abnormal; Notable for the following components:   WBC 11.3 (*)    Hemoglobin 11.6 (*)    HCT 37.4 (*)    RDW 15.7 (*)    Neutro Abs 7.9 (*)    Monocytes Absolute 1.2 (*)    All other components within normal limits  RESP PANEL BY RT-PCR (FLU A&B, COVID) [id number]  PROTIME-INR  TROPONIN I (HIGH SENSITIVITY)  TROPONIN I (HIGH SENSITIVITY)                                                                                                                          EKG  EKG Interpretation  Date/Time:  Thursday September 30 2021 18:19:26 EDT Ventricular Rate:  94 PR Interval:  185 QRS Duration: 108 QT Interval:  342 QTC Calculation: 428 R Axis:   -2 Text Interpretation: Sinus rhythm Confirmed by Addison Lank [phone]) on 10/01/2021 12:00:40 AM       Radiology CT Angio Chest PE W and/or Wo Contrast  Result Date: 09/30/2021 CLINICAL DATA:  Shortness of breath and chest pain. EXAM: CT ANGIOGRAPHY CHEST WITH CONTRAST TECHNIQUE: Multidetector CT imaging of the chest was performed using the standard protocol during bolus administration of intravenous contrast. Multiplanar CT image reconstructions and MIPs were obtained to evaluate the vascular anatomy. RADIATION DOSE REDUCTION: This exam was performed according to the departmental dose-optimization program which includes automated exposure control, adjustment of the mA and/or kV according to patient size and/or use of iterative reconstruction technique. CONTRAST:  8m OMNIPAQUE IOHEXOL 350 MG/ML SOLN COMPARISON:  Chest CT 09/29/2021 FINDINGS: Cardiovascular: Satisfactory opacification of the pulmonary arteries to the segmental level. No evidence of pulmonary embolism. Normal heart size. No pericardial effusion. There is calcified and noncalcified atherosclerotic disease in the aorta, unchanged. Mediastinum/Nodes: No enlarged mediastinal, hilar, or axillary lymph nodes. Thyroid gland, trachea, and esophagus demonstrate no significant findings. Lungs/Pleura: There is a small partially loculated left pleural effusion in the inferior left hemithorax, unchanged from the prior examination. Severe emphysematous changes are again seen. Patchy airspace opacity in the right lower lobe adjacent to the pleural effusion has not significantly changed. There is no new focal lung infiltrate. There are minimal secretions in the mainstem bronchi without  occlusion. There is no evidence for pneumothorax. Upper Abdomen: No acute abnormality. Musculoskeletal: Left seventh and eighth rib fractures appear unchanged from prior. Multilevel degenerative changes affect the spine. Review of the MIP images confirms the above findings. IMPRESSION: 1. No evidence for pulmonary embolism. 2. No significant interval change in partially loculated left pleural effusion with adjacent left lower lobe airspace disease, likely pneumonia. 3. Stable subacute left seventh and eighth rib fractures. 4. Minimal secretions within the bilateral mainstem bronchi, nonocclusive. 5. Aortic Atherosclerosis (ICD10-I70.0) and Emphysema (ICD10-J43.9). Electronically Signed   By: ARonney AstersM.D.   On: 09/30/2021 21:48   DG Chest 2 View  Result Date: 09/30/2021 CLINICAL DATA:  Chest pain EXAM: CHEST - 2 VIEW COMPARISON:  Radiograph 09/29/2021, chest CT 09/29/2021 FINDINGS: Unchanged cardiomediastinal silhouette. Persistent small left pleural effusion and adjacent left basilar opacities. Emphysema. Right basilar subsegmental atelectasis. No visible pneumothorax. Bones are unchanged. IMPRESSION: Persistent small left pleural effusion with adjacent basilar opacities, could be atelectasis or infection. Right basilar subsegmental atelectasis. Electronically Signed   By: JMaurine SimmeringM.D.   On: 09/30/2021 21:28    Pertinent labs & imaging results that were available during my care of the patient were reviewed by me and considered in my medical decision making (see MDM for details).  Medications Ordered in ED Medications  albuterol (PROVENTIL) (2.5 MG/3ML) 0.083% nebulizer solution 2.5 mg (has no administration in time range)  apixaban (ELIQUIS) tablet 5 mg (0 mg Oral Hold 10/01/21 0000)  buPROPion (WELLBUTRIN XL) 24 hr tablet 300 mg (has no administration in time range)  busPIRone (BUSPAR) tablet 15 mg (has no administration in time range)  calcium-vitamin D (OSCAL WITH D) 500-200 MG-UNIT per  tablet 1 tablet (has no administration in time range)  cyclobenzaprine (FLEXERIL) tablet 30 mg (30 mg Oral Given 09/30/21 2359)  feeding supplement (ENSURE ENLIVE / ENSURE PLUS) liquid 237 mL (has no administration in time range)  guaiFENesin (MUCINEX) 12 hr tablet 600 mg (has no administration in time range)  oxyCODONE (Oxy IR/ROXICODONE) immediate release tablet 5-10 mg (10 mg Oral Given 09/30/21 2359)  oxyCODONE (OXYCONTIN) 12 hr tablet 15 mg (has no administration in time range)  prednisoLONE acetate (PRED FORTE) 1 % ophthalmic suspension 1 drop (has no administration in time range)  predniSONE (DELTASONE) tablet 50 mg (has no administration in time range)  fentaNYL (SUBLIMAZE) injection 75 mcg (75 mcg Intravenous Given 09/30/21 2045)  ondansetron (ZOFRAN) injection 4 mg (4 mg Intravenous Given 09/30/21 2046)  iohexol (OMNIPAQUE) 350 MG/ML injection 75 mL (75 mLs Intravenous Contrast Given 09/30/21 2129)                                                                                                                                     Procedures Procedures  (including critical care time)  Medical Decision Making / ED Course    Complexity of Problem:  Co-morbidities/SDOH that complicate the patient evaluation/care: Noted above in HPI  Patient's presenting problem/concern, DDX, and MDM listed below: Chest pain Atypical for ACS but given comorbidities, will obtain cardiac work-up Patient reports that he is compliant with his blood pressure medication side low suspicion for PE but given the sudden onset of the symptoms, will obtain a CT a to rule it out Low suspicion for aortic dissection or esophageal perforation Suicide ideation Patient is high risk given history of cancer and prior attempts We we will obtain work-up from above.  If negative he will be medically cleared for behavioral health evaluation  Hospitalization Considered:  Yes    Complexity of Data:   Cardiac Monitoring: The  patient was maintained on a cardiac monitor.   I personally viewed and interpreted the cardiac monitored which showed an underlying rhythm of normal sinus rhythm with rates in the 90s EKG without acute ischemic changes or evidence of pericarditis.  No dysrhythmias or blocks.  Laboratory Tests ordered listed below with my independent interpretation: CBC with improving leukocytosis.  Stable hemoglobin. Metabolic panel without significant electrolyte derangements or renal sufficiency. Serial troponins negative x2   Imaging Studies ordered listed below with my independent interpretation: Chest x-ray without evidence of pneumothorax/pulmonary edema.  Patient does have small left pleural effusion with atelectasis versus pneumonia CTA negative for PE or pneumonia.  No pneumothorax.   Confirmed by radiology who noted no acute changes from yesterday.     ED Course:    Assessment, Add'l Intervention, and Reassessment: Chest pain Work-up reassuring and feel patient is ruled out for ACS and PE. No other acute findings.  Suicide ideation Medically cleared for behavioral health evaluation and disposition Home medications ordered  Final Clinical Impression(s) / ED Diagnoses Final diagnoses:  Chest discomfort  Suicidal ideation           This chart was dictated using voice recognition software.  Despite best efforts to proofread,  errors can occur which  can change the documentation meaning.    Fatima Blank, MD 10/01/21 0005

## 2021-10-01 ENCOUNTER — Other Ambulatory Visit: Payer: Self-pay

## 2021-10-01 ENCOUNTER — Encounter (HOSPITAL_COMMUNITY): Payer: Self-pay

## 2021-10-01 ENCOUNTER — Emergency Department (HOSPITAL_COMMUNITY)
Admission: EM | Admit: 2021-10-01 | Discharge: 2021-10-04 | Disposition: A | Discharge status: Home / Self Care | Payer: No Typology Code available for payment source | Attending: Emergency Medicine | Admitting: Emergency Medicine

## 2021-10-01 DIAGNOSIS — R45851 Suicidal ideations: Secondary | ICD-10-CM | POA: Diagnosis not present

## 2021-10-01 DIAGNOSIS — F4325 Adjustment disorder with mixed disturbance of emotions and conduct: Secondary | ICD-10-CM | POA: Diagnosis present

## 2021-10-01 DIAGNOSIS — Z79899 Other long term (current) drug therapy: Secondary | ICD-10-CM | POA: Insufficient documentation

## 2021-10-01 DIAGNOSIS — Z20822 Contact with and (suspected) exposure to covid-19: Secondary | ICD-10-CM | POA: Diagnosis not present

## 2021-10-01 DIAGNOSIS — F332 Major depressive disorder, recurrent severe without psychotic features: Secondary | ICD-10-CM | POA: Insufficient documentation

## 2021-10-01 DIAGNOSIS — Z7951 Long term (current) use of inhaled steroids: Secondary | ICD-10-CM | POA: Insufficient documentation

## 2021-10-01 DIAGNOSIS — Z7901 Long term (current) use of anticoagulants: Secondary | ICD-10-CM | POA: Diagnosis not present

## 2021-10-01 DIAGNOSIS — F339 Major depressive disorder, recurrent, unspecified: Secondary | ICD-10-CM | POA: Diagnosis present

## 2021-10-01 DIAGNOSIS — Z85528 Personal history of other malignant neoplasm of kidney: Secondary | ICD-10-CM | POA: Insufficient documentation

## 2021-10-01 DIAGNOSIS — R0602 Shortness of breath: Secondary | ICD-10-CM | POA: Diagnosis not present

## 2021-10-01 DIAGNOSIS — J441 Chronic obstructive pulmonary disease with (acute) exacerbation: Secondary | ICD-10-CM | POA: Diagnosis not present

## 2021-10-01 LAB — RESP PANEL BY RT-PCR (FLU A&B, COVID) ARPGX2
Influenza A by PCR: NEGATIVE
Influenza A by PCR: NEGATIVE
Influenza B by PCR: NEGATIVE
Influenza B by PCR: NEGATIVE
SARS Coronavirus 2 by RT PCR: NEGATIVE
SARS Coronavirus 2 by RT PCR: NEGATIVE

## 2021-10-01 MED ORDER — OXYCODONE HCL 5 MG PO TABS
5.0000 mg | ORAL_TABLET | Freq: Once | ORAL | Status: AC
  Administered 2021-10-02: 5 mg via ORAL
  Filled 2021-10-01: qty 1

## 2021-10-01 MED ORDER — ONDANSETRON HCL 4 MG/2ML IJ SOLN
4.0000 mg | Freq: Once | INTRAMUSCULAR | Status: AC
Start: 2021-10-01 — End: 2021-10-01
  Administered 2021-10-01: 4 mg via INTRAVENOUS
  Filled 2021-10-01: qty 2

## 2021-10-01 MED ORDER — GABAPENTIN 300 MG PO CAPS
300.0000 mg | ORAL_CAPSULE | Freq: Once | ORAL | Status: AC
  Administered 2021-10-02: 300 mg via ORAL
  Filled 2021-10-01: qty 1

## 2021-10-01 MED ORDER — GABAPENTIN 300 MG PO CAPS
300.0000 mg | ORAL_CAPSULE | Freq: Two times a day (BID) | ORAL | Status: DC
  Administered 2021-10-01 (×2): 300 mg via ORAL
  Filled 2021-10-01 (×2): qty 1

## 2021-10-01 MED ORDER — TRAZODONE HCL 50 MG PO TABS
75.0000 mg | ORAL_TABLET | Freq: Every day | ORAL | Status: DC
  Administered 2021-10-01: 75 mg via ORAL
  Filled 2021-10-01: qty 2

## 2021-10-01 MED ORDER — IPRATROPIUM-ALBUTEROL 0.5-2.5 (3) MG/3ML IN SOLN
3.0000 mL | Freq: Once | RESPIRATORY_TRACT | Status: AC
  Administered 2021-10-01: 3 mL via RESPIRATORY_TRACT
  Filled 2021-10-01: qty 3

## 2021-10-01 NOTE — BH Assessment (Signed)
[facility] Assessment Progress Note   Per Ricky Ala, NP, this voluntary pt does not require psychiatric hospitalization at this time.  Pt is psychiatrically cleared.  Discharge instructions advise pt to continue treatment with the [facility].  EDP Teressa Lower, MD and pt's nurse, Destiny, have been notified.  Jalene Mullet, [place] Triage Specialist [phone]

## 2021-10-01 NOTE — ED Notes (Signed)
Pt scrubbed out and belongings placed in cabinet [address]. Pt had pajama pants, a shirt, hat, slippers, and bag. Pt wanded by security as well.

## 2021-10-01 NOTE — BH Assessment (Signed)
Clinician messaged Cresenciano Lick. Zenia Resides, RN: "Hey. It's Trey with TTS. Is the pt able to engage in the assessment, if so the pt will need to be placed in a private room. Is the pt under IVC? Also is the pt medically cleared?"   Clinician awaiting response.    Vertell Novak, [place], [facility], [facility] Triage Specialist [phone]

## 2021-10-01 NOTE — ED Provider Notes (Signed)
[place] DEPT Provider Note   CSN: [id number] Arrival date & time: 10/01/21  1497     History {Add pertinent medical, surgical, social history, OB history to HPI:1} Chief Complaint  Patient presents with   Suicidal    Mario [name] is a 71 y.o. male.  Patient is a 71 year old male who is brought in by EMS today for suicidal ideation.  Patient was reported to have stated " I am going to kill myself.  Going to throw myself down the stairs to break my neck".  Chart review demonstrates patient was here yesterday for chest pain and shortness of breath and was also evaluated by psychiatry for suicidal ideation.  Patient was medically cleared and cleared by [facility] and discharged home.  On my evaluation the patient stating that he had shortness of breath and that is why he called EMS.  He states he thinks he is having a COPD exacerbation.  States he recently moved to a new home and his nebulizer is in a box that he could not find.  States he is otherwise taking his home steroid inhaler daily.  He wheezing or coughing.  Denies any fevers or chills.  States his shortness of breath has resolved at this time.  Also admits to history of stage IV renal cancer and is currently seeking new oncologist through [facility].  Has not had initial appointment yet.  Not currently on chemotherapy or radiation.  He is currently taking blood thinners for history of a pulmonary embolism.  The history is provided by the patient. No language interpreter was used.       Home Medications Prior to Admission medications   Medication Sig Start Date End Date Taking? Authorizing Provider  albuterol (VENTOLIN HFA) 108 (90 Base) MCG/ACT inhaler Inhale 2 puffs into the lungs every 6 (six) hours as needed for wheezing. 06/02/20  Yes [provider]  apixaban (ELIQUIS) 5 MG TABS tablet Take 5 mg by mouth 2 (two) times daily.   Yes [provider]  b complex vitamins capsule Take 1  capsule by mouth every morning.   Yes [provider]  buPROPion (WELLBUTRIN XL) 300 MG 24 hr tablet Take 300 mg by mouth every morning. 02/05/20  Yes [provider]  busPIRone (BUSPAR) 15 MG tablet Take 15 mg by mouth daily.   Yes [provider]  calcium-vitamin D (OSCAL WITH D) 500-200 MG-UNIT tablet Take 1 tablet by mouth 2 (two) times daily with a meal.   Yes [provider]  Cholecalciferol (VITAMIN D-3) 25 MCG (1000 UT) CAPS Take 1,000 Units by mouth 2 (two) times daily.   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   Yes [provider]  clotrimazole-betamethasone (LOTRISONE) cream Apply 1 application. topically 2 (two) times daily as needed (fungus). 07/06/21  Yes Aline August, MD  cyclobenzaprine (FLEXERIL) 10 MG tablet Take 30 mg by mouth at bedtime.   Yes [provider]  cycloSPORINE (RESTASIS) 0.05 % ophthalmic emulsion Place 1 drop into both eyes 2 (two) times daily.   Yes [provider]  diltiazem (CARDIZEM CD) 180 MG 24 hr capsule Take 180 mg by mouth daily.   Yes [provider]  docusate sodium (COLACE) 100 MG capsule Take 100 mg by mouth 2 (two) times daily.   Yes [provider]  DULoxetine (CYMBALTA) 60 MG capsule Take 1  capsule (60 mg total) by mouth 2 (two) times daily. 07/21/17  Yes Lavina Hamman, MD  feeding supplement (ENSURE ENLIVE / ENSURE PLUS) LIQD Take 237 mLs by mouth 3 (three) times daily between meals. Patient taking differently: Take 237 mLs by mouth 2 (two) times daily. 08/04/20  Yes Lavina Hamman, MD  fentaNYL (DURAGESIC) 12 MCG/HR Place 1 patch onto the skin every 3 (three) days. 09/28/21 10/28/21 Yes Dahal, Marlowe Aschoff, MD  fluticasone (FLONASE) 50 MCG/ACT nasal spray Place 2 sprays into both nostrils daily.    Yes [provider]  folic acid (FOLVITE) 1 MG  tablet Take 1 mg by mouth daily.   Yes [provider]  gabapentin (NEURONTIN) 300 MG capsule Take 1 capsule (300 mg total) by mouth 2 (two) times daily. Patient taking differently: Take 900 mg by mouth at bedtime. 07/06/21  Yes Aline August, MD  GARLIC OIL PO Take 1 capsule by mouth daily.   Yes [provider]  lisinopril (ZESTRIL) 20 MG tablet Take 20 mg by mouth every morning.   Yes [provider]  lurasidone (LATUDA) 40 MG TABS tablet Take 40 mg by mouth daily after supper.   Yes [provider]  Melatonin 3 MG TABS Take 6 mg by mouth at bedtime.   Yes [provider]  oxcarbazepine (TRILEPTAL) 600 MG tablet Take 600 mg by mouth 3 (three) times daily. 07/16/20  Yes [provider]  sodium chloride (OCEAN) 0.65 % SOLN nasal spray Place 2 sprays into both nostrils 4 (four) times daily as needed for congestion.   Yes [provider]  tamsulosin (FLOMAX) 0.4 MG CAPS capsule Take 0.8 mg by mouth daily.   Yes [provider]  Tiotropium Bromide-Olodaterol 2.5-2.5 MCG/ACT AERS Inhale 2 each into the lungs every morning. 2 puffs   Yes [provider]  traZODone (DESYREL) 50 MG tablet Take 75 mg by mouth at bedtime.   Yes [provider]  VITAMIN A PO Take 1 capsule by mouth daily.   Yes [provider]  vitamin C (ASCORBIC ACID) 500 MG tablet Take 500 mg by mouth 2 (two) times daily.   Yes [provider]  albuterol (PROVENTIL) (2.5 MG/3ML) 0.083% nebulizer solution Inhale 3 mLs (2.5 mg total) by nebulization every 4 (four) hours as needed for wheezing or shortness of breath. Patient taking differently: Take 2.5 mg by nebulization every 6 (six) hours as needed for wheezing or shortness of breath. 08/04/20 10/02/22  Lavina Hamman, MD  carboxymethylcellulose (REFRESH PLUS) 0.5 % SOLN Place 1 drop into both eyes 4 (four) times daily as needed (dry eyes).    [provider]  guaiFENesin  (MUCINEX) 600 MG 12 hr tablet Take 1 tablet (600 mg total) by mouth 2 (two) times daily as needed for cough. Patient not taking: Reported on 10/01/2021 09/26/21 12/25/21  Terrilee Croak, MD  hydrOXYzine (ATARAX) 10 MG tablet Take 1 tablet (10 mg total) by mouth 2 (two) times daily as needed for anxiety (30). Patient not taking: Reported on 10/01/2021 09/26/21 11/25/21  Terrilee Croak, MD  methotrexate (RHEUMATREX) 2.5 MG tablet Take 15 mg by mouth once a week. Caution:Chemotherapy. Protect from light. Patient not taking: Reported on 09/24/2021    [provider]  Mometasone Furoate 200 MCG/ACT AERO Inhale 2 puffs into the lungs at bedtime as needed (shortness of breath/wheezing).    [provider]  mycophenolate (CELLCEPT) 250 MG capsule Take 1,500 mg by mouth 2 (two) times daily.  [provider]  nitroGLYCERIN (NITROSTAT) 0.4 MG SL tablet Place 1 tablet (0.4 mg total) under the tongue every 5 (five) minutes as needed for chest pain. Patient not taking: Reported on 08/20/2021 07/03/19   Edwin Dada, MD  oxyCODONE (OXY IR/ROXICODONE) 5 MG immediate release tablet Take 1-2 tablets (5-10 mg total) by mouth every 4 (four) hours as needed for severe pain. 10/23/03   Delora Fuel, MD  oxyCODONE (OXYCONTIN) 15 mg 12 hr tablet Take 15 mg by mouth daily as needed (pain).    [provider]  OXYGEN Inhale 2 L into the lungs continuous.    [provider]  polyethylene glycol (MIRALAX / GLYCOLAX) 17 g packet Take 17 g by mouth 2 (two) times daily.    [provider]  prednisoLONE acetate (PRED FORTE) 1 % ophthalmic suspension Place 1 drop into the left eye every hour while awake.    [provider]  predniSONE (DELTASONE) 50 MG tablet Take 1 tablet (50 mg total) by mouth daily with breakfast for 5 days. Patient not taking: Reported on 10/01/2021 09/26/21 10/01/21  Terrilee Croak, MD      Allergies    Demerol [meperidine], Zocor [simvastatin], Beet  [beta vulgaris], and Liver    Review of Systems   Review of Systems  Constitutional:  Negative for chills and fever.  HENT:  Negative for ear pain and sore throat.   Eyes:  Negative for pain and visual disturbance.  Respiratory:  Positive for shortness of breath. Negative for cough.   Cardiovascular:  Negative for chest pain and palpitations.  Gastrointestinal:  Negative for abdominal pain and vomiting.  Genitourinary:  Negative for dysuria and hematuria.  Musculoskeletal:  Negative for arthralgias and back pain.  Skin:  Negative for color change and rash.  Neurological:  Negative for seizures and syncope.  Psychiatric/Behavioral:  Positive for suicidal ideas.   All other systems reviewed and are negative.   Physical Exam Updated Vital Signs BP (!) 177/88   Pulse (!) 115   Temp 98.9 F (37.2 C) (Oral)   Resp (!) 22   SpO2 96%  Physical Exam Vitals and nursing note reviewed.  Constitutional:      General: He is not in acute distress.    Appearance: He is well-developed.  HENT:     Head: Normocephalic and atraumatic.  Eyes:     Conjunctiva/sclera: Conjunctivae normal.  Cardiovascular:     Rate and Rhythm: Normal rate and regular rhythm.     Heart sounds: No murmur heard. Pulmonary:     Effort: Pulmonary effort is normal. No respiratory distress.     Breath sounds: Normal breath sounds.  Abdominal:     Palpations: Abdomen is soft.     Tenderness: There is no abdominal tenderness.  Musculoskeletal:        General: No swelling.     Cervical back: Neck supple.  Skin:    General: Skin is warm and dry.     Capillary Refill: Capillary refill takes less than 2 seconds.  Neurological:     Mental Status: He is alert.  Psychiatric:        Mood and Affect: Mood normal.     ED Results / Procedures / Treatments   Labs (all labs ordered are listed, but only abnormal results are displayed) Labs Reviewed  RESP PANEL BY RT-PCR (FLU A&B, COVID) [id number]     EKG None  Radiology CT Angio Chest PE W and/or Wo Contrast  Result Date: 09/30/2021  CLINICAL DATA:  Shortness of breath and chest pain. EXAM: CT ANGIOGRAPHY CHEST WITH CONTRAST TECHNIQUE: Multidetector CT imaging of the chest was performed using the standard protocol during bolus administration of intravenous contrast. Multiplanar CT image reconstructions and MIPs were obtained to evaluate the vascular anatomy. RADIATION DOSE REDUCTION: This exam was performed according to the departmental dose-optimization program which includes automated exposure control, adjustment of the mA and/or kV according to patient size and/or use of iterative reconstruction technique. CONTRAST:  65mL OMNIPAQUE IOHEXOL 350 MG/ML SOLN COMPARISON:  Chest CT 09/29/2021 FINDINGS: Cardiovascular: Satisfactory opacification of the pulmonary arteries to the segmental level. No evidence of pulmonary embolism. Normal heart size. No pericardial effusion. There is calcified and noncalcified atherosclerotic disease in the aorta, unchanged. Mediastinum/Nodes: No enlarged mediastinal, hilar, or axillary lymph nodes. Thyroid gland, trachea, and esophagus demonstrate no significant findings. Lungs/Pleura: There is a small partially loculated left pleural effusion in the inferior left hemithorax, unchanged from the prior examination. Severe emphysematous changes are again seen. Patchy airspace opacity in the right lower lobe adjacent to the pleural effusion has not significantly changed. There is no new focal lung infiltrate. There are minimal secretions in the mainstem bronchi without occlusion. There is no evidence for pneumothorax. Upper Abdomen: No acute abnormality. Musculoskeletal: Left seventh and eighth rib fractures appear unchanged from prior. Multilevel degenerative changes affect the spine. Review of the MIP images confirms the above findings. IMPRESSION: 1. No evidence for pulmonary embolism. 2. No significant interval change in  partially loculated left pleural effusion with adjacent left lower lobe airspace disease, likely pneumonia. 3. Stable subacute left seventh and eighth rib fractures. 4. Minimal secretions within the bilateral mainstem bronchi, nonocclusive. 5. Aortic Atherosclerosis (ICD10-I70.0) and Emphysema (ICD10-J43.9). Electronically Signed   By: Ronney Asters M.D.   On: 09/30/2021 21:48   DG Chest 2 View  Result Date: 09/30/2021 CLINICAL DATA:  Chest pain EXAM: CHEST - 2 VIEW COMPARISON:  Radiograph 09/29/2021, chest CT 09/29/2021 FINDINGS: Unchanged cardiomediastinal silhouette. Persistent small left pleural effusion and adjacent left basilar opacities. Emphysema. Right basilar subsegmental atelectasis. No visible pneumothorax. Bones are unchanged. IMPRESSION: Persistent small left pleural effusion with adjacent basilar opacities, could be atelectasis or infection. Right basilar subsegmental atelectasis. Electronically Signed   By: Maurine Simmering M.D.   On: 09/30/2021 21:28    Procedures Procedures  {Document cardiac monitor, telemetry assessment procedure when appropriate:1}  Medications Ordered in ED Medications  ipratropium-albuterol (DUONEB) 0.5-2.5 (3) MG/3ML nebulizer solution 3 mL (3 mLs Nebulization Given 10/01/21 2113)  ondansetron (ZOFRAN) injection 4 mg (4 mg Intravenous Given 10/01/21 2119)    ED Course/ Medical Decision Making/ A&P                           Medical Decision Making Amount and/or Complexity of Data Reviewed ECG/medicine tests: ordered.  Risk Prescription drug management.   71 year old male who is brought in by EMS today for suicidal ideation.  Patient was reported to have stated " I am going to kill myself.  Patient also endorses shortness of breath.  Patient is alert and oriented x3, no acute distress, afebrile.  On initial evaluation by triage patient was tachycardic and tachypneic.  On my evaluation he has stable vital signs.  Equal bilateral breath sounds with no  adventitious lung sounds.  No wheezing.  He states all shortness of breath has improved at this time.  Requesting DuoNeb treatment for COPD.  DuoNeb treatment given.  Had stable  cardiac work-up yesterday including EKG, troponins, electrolytes, and chest x-ray.  Repeat EKG today demonstrates no ST segment elevation or depression.  Patient denies any chest pain at this time.  Will hold off on troponins.  Medically cleared at this time and safe to be evaluated by [facility] physicians   {Document critical care time when appropriate:1} {Document review of labs and clinical decision tools ie heart score, Chads2Vasc2 etc:1}  {Document your independent review of radiology images, and any outside records:1} {Document your discussion with family members, caretakers, and with consultants:1} {Document social determinants of health affecting pt's care:1} {Document your decision making why or why not admission, treatments were needed:1} Final Clinical Impression(s) / ED Diagnoses Final diagnoses:  Shortness of breath  Suicidal ideation    Rx / DC Orders ED Discharge Orders     None

## 2021-10-01 NOTE — ED Notes (Addendum)
[facility] called patient  added to transportation list.

## 2021-10-01 NOTE — Discharge Instructions (Addendum)
For your behavioral health needs you are advised to continue treatment with the [facility]:       [facility]      [place], [place] [address]      [phone]
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
## 2021-10-01 NOTE — ED Triage Notes (Addendum)
Bibems for si w/ plan to throw self down stairs and break neck. Denies HI, A/V/H. Seen and discharged here this morning for same. Vss. Nadn. Pt comes wearing 3lpm home o2.

## 2021-10-01 NOTE — Consult Note (Signed)
[facility] Psych ED Discharge  10/01/2021 11:56 AM Mario [name]  MRN:  [id number]  Method of visit?: Face to Face   Principal Problem: Depression Discharge Diagnoses: Principal Problem:   Depression   Subjective: Mario [name] is a 71 year old Caucasian male who was seen and evaluated face-to-face by this provider.  Currently denying suicidal or homicidal ideations.  Denies auditory visual hallucinations.  [facility].  States he is prescribed Wellbutrin, Cymbalta and Latuda for reported history of depression and anxiety.  States Dr. Owens Shark prescribes his medications.  He denied illicit drug use or substance abuse history.  Reports a previous inpatient admission 20 years ago.  States he has been medication compliant since.  States he recently moved to a new location and states he does not like the new apartment to which he moved in."  Its run down and very humid" he reports having a difficult time breathing.  He provided verbal authorization to follow-up with his daughter for additional collateral Bobbie.Multiple attempts made to contact Bobbie. patient reports his daughter is 40 years is  expecting her second child was recently evaluated due to early labor signs so she made not be available at this time.  He denies access to guns or weapons.  States he has a good support system.  Reports a follow-up appointment with his psychiatrist virtually at 1 PM.  Case staffed with attending psychiatrist MD Dwyane Dee.  Support, encouragement and reassurance was provided.  Per admission assessment note: Mario [name] is a 71 y.o. male with a past medical history listed below including COPD on 3 L nasal cannula, metastatic lung cancer with pleural effusions and pathologic rib fractures here for sudden onset shortness of breath and chest pain described as pressure.    No alleviating or aggravating factors.  Patient reports having had this pain in the past.  Pain  lasted for 3 to 4 hours. Patient was seen yesterday for shortness of breath and abdominal pain and had a reassuring work-up.He did report moving to a new location 3 days ago and stating that this was a really bad position.  He states that in the move his psychiatric medications went missing.  Endorsing suicidal ideation stating that he cannot take his living situation anymore.  He states he has a plan to overdose on his Flexeril.  He reports prior suicide attempts in the past by asphyxiation.  Total Time spent with patient: 15 minutes  Past Psychiatric History:   Past Medical History:  Past Medical History:  Diagnosis Date   Anxiety    Bronchitis    COPD (chronic obstructive pulmonary disease) ([place])    Depression    History of radiation therapy    Left lung- 10/05/20-10/15/20- Dr. Gery Pray   Hypertension    lung ca 09/2020   MI (myocardial infarction) [facility])    ????   OSA (obstructive sleep apnea)    Suicide attempt [facility])    Tension pneumothorax 06/27/2016   Uveitis     Past Surgical History:  Procedure Laterality Date   BIOPSY  07/03/2021   Procedure: BIOPSY;  Surgeon: Otis Brace, MD;  Location: [facility];  Service: Gastroenterology;;   BRONCHIAL BIOPSY  07/30/2020   Procedure: BRONCHIAL BIOPSIES;  Surgeon: Garner Nash, DO;  Location: [place] [facility];  Service: Pulmonary;;   BRONCHIAL BRUSHINGS  07/30/2020   Procedure: BRONCHIAL BRUSHINGS;  Surgeon: Garner Nash, DO;  Location: [place];  Service: Pulmonary;;   BRONCHIAL NEEDLE ASPIRATION  BIOPSY  07/30/2020   Procedure: BRONCHIAL NEEDLE ASPIRATION BIOPSIES;  Surgeon: Garner Nash, DO;  Location: [place] [facility];  Service: Pulmonary;;   BRONCHIAL WASHINGS  07/30/2020   Procedure: BRONCHIAL WASHINGS;  Surgeon: Garner Nash, DO;  Location: [place] [facility];  Service: Pulmonary;;   CHEST TUBE INSERTION Left 06/27/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     ESOPHAGOGASTRODUODENOSCOPY N/A 07/03/2021   Procedure: ESOPHAGOGASTRODUODENOSCOPY  (EGD);  Surgeon: Otis Brace, MD;  Location: [name];  Service: Gastroenterology;  Laterality: N/A;   IR PERC PLEURAL DRAIN W/INDWELL CATH W/IMG GUIDE  08/04/2021   IR REMOVAL OF PLURAL CATH W/CUFF  09/01/2021   SKIN CANCER EXCISION     VIDEO BRONCHOSCOPY WITH ENDOBRONCHIAL NAVIGATION Left 07/30/2020   Procedure: VIDEO BRONCHOSCOPY WITH ENDOBRONCHIAL NAVIGATION;  Surgeon: Garner Nash, DO;  Location: [facility];  Service: Pulmonary;  Laterality: Left;   Family History:  Family History  Problem Relation Age of Onset   Dementia Father    Family Psychiatric  History:  Social History:  Social History   Substance and Sexual Activity  Alcohol Use No   Alcohol/week: 0.0 standard drinks of alcohol   Comment: denies use of any drugs or alcohol     Social History   Substance and Sexual Activity  Drug Use No    Social History   Socioeconomic History   Marital status: Single    Spouse name: Not on file   Number of children: Not on file   Years of education: Not on file   Highest education level: Not on file  Occupational History   Occupation: retired  Tobacco Use   Smoking status: Former    Packs/day: 1.00    Years: 35.00    Total pack years: 35.00    Types: Cigarettes    Quit date: 05/2016    Years since quitting: 5.3   Smokeless tobacco: Never  Vaping Use   Vaping Use: Never used  Substance and Sexual Activity   Alcohol use: No    Alcohol/week: 0.0 standard drinks of alcohol    Comment: denies use of any drugs or alcohol   Drug use: No   Sexual activity: Not on file  Other Topics Concern   Not on file  Social History Narrative   Not on file   Social Determinants of Health   Financial Resource Strain: Low Risk  (09/23/2020)   Overall Financial Resource Strain (CARDIA)    Difficulty of Paying Living Expenses: Not hard at all  Food Insecurity: Not on file  Transportation Needs: No Transportation Needs (09/23/2020)   PRAPARE - Radiographer, therapeutic (Medical): No    Lack of Transportation (Non-Medical): No  Physical Activity: Not on file  Stress: No Stress Concern Present (09/23/2020)   [place]    Feeling of Stress : Not at all  Social Connections: Not on file    Tobacco Cessation:  N/A, patient does not currently use tobacco products  Current Medications: Current Facility-Administered Medications  Medication Dose Route Frequency Provider Last Rate Last Admin   albuterol (PROVENTIL) (2.5 MG/3ML) 0.083% nebulizer solution 2.5 mg  2.5 mg Nebulization Q6H PRN Cardama, Grayce Sessions, MD       apixaban Arne Cleveland) tablet 5 mg  5 mg Oral BID Cardama, Grayce Sessions, MD   5 mg at 10/01/21 0932   buPROPion (WELLBUTRIN XL) 24 hr tablet 300 mg  300 mg Oral q morning Cardama, Grayce Sessions, MD  300 mg at 10/01/21 0932   busPIRone (BUSPAR) tablet 15 mg  15 mg Oral Daily Cardama, Grayce Sessions, MD   15 mg at 10/01/21 0932   calcium-vitamin D (OSCAL WITH D) 500-5 MG-MCG per tablet 1 tablet  1 tablet Oral BID WC Cardama, Grayce Sessions, MD   1 tablet at 10/01/21 0932   cyclobenzaprine (FLEXERIL) tablet 30 mg  30 mg Oral QHS Fatima Blank, MD   30 mg at 09/30/21 2359   feeding supplement (ENSURE ENLIVE / ENSURE PLUS) liquid 237 mL  237 mL Oral TID BM Cardama, Grayce Sessions, MD   237 mL at 10/01/21 1037   gabapentin (NEURONTIN) capsule 300 mg  300 mg Oral BID Fatima Blank, MD   300 mg at 10/01/21 0932   guaiFENesin (MUCINEX) 12 hr tablet 600 mg  600 mg Oral BID PRN Cardama, Grayce Sessions, MD       oxyCODONE (Oxy IR/ROXICODONE) immediate release tablet 5-10 mg  5-10 mg Oral Q4H PRN Fatima Blank, MD   10 mg at 10/01/21 2706   oxyCODONE (OXYCONTIN) 12 hr tablet 15 mg  15 mg Oral Daily PRN Cardama, Grayce Sessions, MD       prednisoLONE acetate (PRED FORTE) 1 % ophthalmic suspension 1 drop  1 drop Left Eye Q1H while awake Cardama, Grayce Sessions, MD   1  drop at 10/01/21 1101   predniSONE (DELTASONE) tablet 50 mg  50 mg Oral Q breakfast Cardama, Grayce Sessions, MD   50 mg at 10/01/21 0932   traZODone (DESYREL) tablet 75 mg  75 mg Oral QHS Fatima Blank, MD   75 mg at 10/01/21 0118   Current Outpatient Medications  Medication Sig Dispense Refill   albuterol (PROVENTIL) (2.5 MG/3ML) 0.083% nebulizer solution Inhale 3 mLs (2.5 mg total) by nebulization every 4 (four) hours as needed for wheezing or shortness of breath. (Patient taking differently: Take 2.5 mg by nebulization every 6 (six) hours as needed for wheezing or shortness of breath.) 90 mL 2   albuterol (VENTOLIN HFA) 108 (90 Base) MCG/ACT inhaler Inhale 2 puffs into the lungs every 6 (six) hours as needed for wheezing.     apixaban (ELIQUIS) 5 MG TABS tablet Take 5 mg by mouth 2 (two) times daily.     b complex vitamins capsule Take 1 capsule by mouth every morning.     buPROPion (WELLBUTRIN XL) 300 MG 24 hr tablet Take 300 mg by mouth every morning.     busPIRone (BUSPAR) 15 MG tablet Take 15 mg by mouth daily.     calcium-vitamin D (OSCAL WITH D) 500-200 MG-UNIT tablet Take 1 tablet by mouth 2 (two) times daily with a meal.     carboxymethylcellulose (REFRESH PLUS) 0.5 % SOLN Place 1 drop into both eyes 4 (four) times daily as needed (dry eyes).     Cholecalciferol (VITAMIN D-3) 25 MCG (1000 UT) CAPS Take 1,000 Units by mouth 2 (two) times daily.     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     clotrimazole-betamethasone (LOTRISONE) cream Apply 1 application. topically 2 (two) times daily as needed (fungus).     cyclobenzaprine (FLEXERIL) 10 MG tablet Take 30 mg by mouth at bedtime.     cycloSPORINE (RESTASIS) 0.05 % ophthalmic emulsion Place 1 drop into both eyes 2 (two) times daily.     diltiazem (CARDIZEM CD) 180 MG 24 hr capsule Take  180 mg by mouth daily.     docusate sodium  (COLACE) 100 MG capsule Take 100 mg by mouth 2 (two) times daily.     DULoxetine (CYMBALTA) 60 MG capsule Take 1 capsule (60 mg total) by mouth 2 (two) times daily. 60 capsule 0   feeding supplement (ENSURE ENLIVE / ENSURE PLUS) LIQD Take 237 mLs by mouth 3 (three) times daily between meals. (Patient taking differently: Take 237 mLs by mouth 2 (two) times daily.) 10000 mL 0   fluticasone (FLONASE) 50 MCG/ACT nasal spray Place 2 sprays into both nostrils daily.      folic acid (FOLVITE) 1 MG tablet Take 1 mg by mouth daily.     gabapentin (NEURONTIN) 300 MG capsule Take 1 capsule (300 mg total) by mouth 2 (two) times daily. (Patient taking differently: Take 900 mg by mouth at bedtime.) 20 capsule 0   GARLIC OIL PO Take 1 capsule by mouth daily.     guaiFENesin (MUCINEX) 600 MG 12 hr tablet Take 1 tablet (600 mg total) by mouth 2 (two) times daily as needed for cough. 180 tablet 0   lisinopril (ZESTRIL) 20 MG tablet Take 20 mg by mouth every morning.     lurasidone (LATUDA) 40 MG TABS tablet Take 40 mg by mouth daily after supper.     Melatonin 3 MG TABS Take 6 mg by mouth at bedtime.     Mometasone Furoate 200 MCG/ACT AERO Inhale 2 puffs into the lungs at bedtime as needed (shortness of breath/wheezing).     mycophenolate (CELLCEPT) 250 MG capsule Take 1,500 mg by mouth 2 (two) times daily.     oxcarbazepine (TRILEPTAL) 600 MG tablet Take 600 mg by mouth 3 (three) times daily.     oxyCODONE (OXY IR/ROXICODONE) 5 MG immediate release tablet Take 1-2 tablets (5-10 mg total) by mouth every 4 (four) hours as needed for severe pain. 30 tablet 0   oxyCODONE (OXYCONTIN) 15 mg 12 hr tablet Take 15 mg by mouth daily as needed (pain).     OXYGEN Inhale 2 L into the lungs continuous.     polyethylene glycol (MIRALAX / GLYCOLAX) 17 g packet Take 17 g by mouth 2 (two) times daily.     prednisoLONE acetate (PRED FORTE) 1 % ophthalmic suspension Place 1 drop into the left eye every hour while awake.     sodium  chloride (OCEAN) 0.65 % SOLN nasal spray Place 2 sprays into both nostrils 4 (four) times daily as needed for congestion.     tamsulosin (FLOMAX) 0.4 MG CAPS capsule Take 0.8 mg by mouth daily.     Tiotropium Bromide-Olodaterol 2.5-2.5 MCG/ACT AERS Inhale 2 each into the lungs every morning. 2 puffs     traZODone (DESYREL) 50 MG tablet Take 75 mg by mouth at bedtime.     VITAMIN A PO Take 1 capsule by mouth daily.     vitamin C (ASCORBIC ACID) 500 MG tablet Take 500 mg by mouth 2 (two) times daily.     fentaNYL (DURAGESIC) 12 MCG/HR Place 1 patch onto the skin every 3 (three) days. (Patient not taking: Reported on 10/01/2021) 10 patch 0   hydrOXYzine (ATARAX) 10 MG tablet Take 1 tablet (10 mg total) by mouth 2 (two) times daily as needed for anxiety (30). (Patient not taking: Reported on 10/01/2021) 30 tablet 0   methotrexate (RHEUMATREX) 2.5 MG tablet Take 15 mg by mouth once a week. Caution:Chemotherapy. Protect from light. (Patient not taking: Reported on 09/24/2021)  nitroGLYCERIN (NITROSTAT) 0.4 MG SL tablet Place 1 tablet (0.4 mg total) under the tongue every 5 (five) minutes as needed for chest pain. (Patient not taking: Reported on 08/20/2021) 12 tablet 12   predniSONE (DELTASONE) 50 MG tablet Take 1 tablet (50 mg total) by mouth daily with breakfast for 5 days. (Patient not taking: Reported on 10/01/2021) 5 tablet 0   PTA Medications: (Not in a hospital admission)   Musculoskeletal: Strength & Muscle Tone: within normal limits Gait & Station: normal Patient leans: N/A  Psychiatric Specialty Exam:  Presentation  General Appearance: Appropriate for Environment Eye Contact:Good Speech:Clear and Coherent Speech Volume:Normal Handedness:Right  Mood and Affect  Mood:Anxious Affect:Congruent  Thought Process  Thought Processes:Coherent Descriptions of Associations:Intact  Orientation:Full (Time, Place and Person)  Thought Content:Logical  History of  Schizophrenia/Schizoaffective disorder:No data recorded Duration of Psychotic Symptoms:No data recorded Hallucinations:Hallucinations: None  Ideas of Reference:None  Suicidal Thoughts:Suicidal Thoughts: No  Homicidal Thoughts:Homicidal Thoughts: No   Sensorium  Memory:Immediate Fair; Remote Fair Judgment:Fair Insight:Fair  Executive Functions  Concentration:Good Attention Span:Good [place] of Knowledge:Good Language:Fair  Psychomotor Activity  Psychomotor Activity:Psychomotor Activity: Normal  Assets  Assets:Desire for Improvement  Sleep  Sleep:Sleep: Fair   Physical Exam: Physical Exam Vitals and nursing note reviewed.  Cardiovascular:     Rate and Rhythm: Normal rate and regular rhythm.  Pulmonary:     Effort: Pulmonary effort is normal.  Neurological:     Mental Status: He is alert and oriented to person, place, and time.  Psychiatric:        Mood and Affect: Mood normal.        Behavior: Behavior normal.    Review of Systems  Cardiovascular: Negative.   Genitourinary: Negative.   Psychiatric/Behavioral:  Positive for depression. Negative for suicidal ideas. The patient is nervous/anxious.   All other systems reviewed and are negative.  Blood pressure 126/77, pulse 88, temperature 98 F (36.7 C), temperature source Oral, resp. rate 18, height 6\' 2"  (1.88 m), weight 135.2 kg, SpO2 100 %. Body mass index is 38.27 kg/m.   Demographic Factors:  Male  Loss Factors: Decline in physical health  Historical Factors: NA  Risk Reduction Factors:   Positive social support and Positive therapeutic relationship  Continued Clinical Symptoms:  Anorexia Nervosa Chronic Pain  Cognitive Features That Contribute To Risk:  Closed-mindedness    Suicide Risk:  Minimal: No identifiable suicidal ideation.  Patients presenting with no risk factors but with morbid ruminations; may be classified as minimal risk based on the severity of the depressive  symptoms   Follow-up [facility].   Specialty: Emergency Medicine Why: If symptoms worsen Contact information: [place] [phone] [place] [id number] [phone]                Plan Of Care/Follow-up recommendations:  Activity:  as tolerated Diet:  heart healthy  Disposition: Take all medications as prescribed. Keep all follow-up appointments as scheduled.  Do not consume alcohol or use illegal drugs while on prescription medications. Report any adverse effects from your medications to your primary care provider promptly.  In the event of recurrent symptoms or worsening symptoms, call 911, a crisis hotline, or go to the nearest emergency department for evaluation.     Derrill Center, NP 10/01/2021, 11:56 AM

## 2021-10-02 MED ORDER — APIXABAN 5 MG PO TABS
5.0000 mg | ORAL_TABLET | Freq: Two times a day (BID) | ORAL | Status: DC
  Administered 2021-10-02 – 2021-10-04 (×5): 5 mg via ORAL
  Filled 2021-10-02 (×5): qty 1

## 2021-10-02 MED ORDER — MYCOPHENOLATE MOFETIL 250 MG PO CAPS
1500.0000 mg | ORAL_CAPSULE | Freq: Two times a day (BID) | ORAL | Status: DC
  Administered 2021-10-02 – 2021-10-04 (×5): 1500 mg via ORAL
  Filled 2021-10-02 (×6): qty 6

## 2021-10-02 MED ORDER — DULOXETINE HCL 30 MG PO CPEP
60.0000 mg | ORAL_CAPSULE | Freq: Two times a day (BID) | ORAL | Status: DC
  Administered 2021-10-02 – 2021-10-04 (×5): 60 mg via ORAL
  Filled 2021-10-02 (×5): qty 2

## 2021-10-02 MED ORDER — CYCLOBENZAPRINE HCL 10 MG PO TABS
30.0000 mg | ORAL_TABLET | Freq: Every day | ORAL | Status: DC
  Administered 2021-10-02 – 2021-10-03 (×2): 30 mg via ORAL
  Filled 2021-10-02 (×2): qty 3

## 2021-10-02 MED ORDER — BUSPIRONE HCL 10 MG PO TABS
15.0000 mg | ORAL_TABLET | Freq: Every day | ORAL | Status: DC
  Administered 2021-10-02 – 2021-10-04 (×3): 15 mg via ORAL
  Filled 2021-10-02 (×3): qty 2

## 2021-10-02 MED ORDER — DULOXETINE HCL 30 MG PO CPEP: 60.0000 mg | ORAL_CAPSULE | Freq: Two times a day (BID) | ORAL | Status: DC

## 2021-10-02 MED ORDER — DILTIAZEM HCL ER COATED BEADS 180 MG PO CP24
180.0000 mg | ORAL_CAPSULE | Freq: Every day | ORAL | Status: DC
  Administered 2021-10-02 – 2021-10-04 (×3): 180 mg via ORAL
  Filled 2021-10-02 (×3): qty 1

## 2021-10-02 MED ORDER — BUPROPION HCL ER (XL) 150 MG PO TB24
300.0000 mg | ORAL_TABLET | Freq: Every morning | ORAL | Status: DC
  Administered 2021-10-02 – 2021-10-04 (×3): 300 mg via ORAL
  Filled 2021-10-02 (×3): qty 2

## 2021-10-02 MED ORDER — OXYCODONE HCL 5 MG PO TABS
5.0000 mg | ORAL_TABLET | ORAL | Status: DC | PRN
  Administered 2021-10-02 – 2021-10-03 (×6): 10 mg via ORAL
  Administered 2021-10-04: 5 mg via ORAL
  Filled 2021-10-02 (×5): qty 2
  Filled 2021-10-02: qty 1
  Filled 2021-10-02 (×2): qty 2

## 2021-10-02 NOTE — Progress Notes (Signed)
Per Quintella Reichert, NP, patient meets criteria for inpatient treatment. There are no available beds at [facility] today. CSW faxed referrals to the following facilities for review:  [facility]  Pending - No Request Sent N/A [address] [name] [address] [phone] --  [facility]  Pending - No Request Sent N/A [address] [place] [address] [phone] --  [facility]  Pending - No Request Sent N/A [address] [place], [place] [address] [phone] --  [facility]  Pending - No Request Sent N/A [address] [place] [address] [phone] --  [facility]  Pending - No Request Sent N/A [address], [place] [address] [phone] --  [facility]  Pending - No Request Sent N/A [address], [place] [address] [phone] --  [facility]  Pending - No Request Sent N/A [address], [place] [address] [phone] --   TTS will continue to seek bed placement.  Glennie [place], MSW, [facility] [place] Phone: [phone] Disposition/TOC

## 2021-10-02 NOTE — Consult Note (Signed)
[facility] ED ASSESSMENT   Reason for Consult: Psychiatry evaluation Referring Physician:  ER Physician Patient Identification: Mario [name] MRN:  [id number] ED Chief Complaint: Major depressive disorder, recurrent episode ([place])  Diagnosis:  Principal Problem:   Major depressive disorder, recurrent episode ([place]) Active Problems:   Adjustment disorder with mixed disturbance of emotions and conduct   ED Assessment Time Calculation: No data recorded  Subjective:   Mario [name] is a 71 y.o. male patient admitted with previous hx significant for Depression , anxiety and suicide ideation .came back to the ER after discharge yesterday.  This time patient plans to OD on his pills-Muscle relaxant.  HPI:  Patient was seen in the ER bed resting and this morning rated depression 10/10 with 10 being severe depression.  Patient reported his triggers for depression as bad move of leaving a clean better environment to a very dirt place where he has roommates.  He is happy with his roommates but the environment is very humid and dirt.  He reported that because he is not happy with the move he made he feels suicidal everyday and quite frequently.  Patient also stated that he feels worthless and hopeless because of his stage 4 Lung CA which is the fault of his Cancer doctor.  Patient is a English as a second language teacher and receives care at the [facility] in [place].  He reported that he cannot stop thinking about ending his life because he his life does not worth anything.  His daughter is in [place] and he does not want to bother her with her new family.  Patient reported "rotten sleep" stating he does not sleep at all especially in his new filthy housing.  He reported good appetite.  Patient want to be transferred to the [facility] hospital in [facility] to get care for Depression and Lung CA.  Patient Denied HI/AVH. We have resumed home medications and effort to contact admissions at [facility] in [place] failed as nobody answered call there.  We will  seek inpatient Geropsychiatry unit for mental health care and safety and stabilization.  We will fax out records for available beds.  Past Psychiatric History: Depression, anxiety, Suicide attempt 25 years ago -tried to suffocate self.  Patient denied inpatient hospitalization for mental health  Risk to Self or Others: Is the patient at risk to self? Yes Has the patient been a risk to self in the past 6 months? Yes Has the patient been a risk to self within the distant past? Yes Is the patient a risk to others? No Has the patient been a risk to others in the past 6 months? No Has the patient been a risk to others within the distant past? No  [name]:  [place] ED from 10/01/2021 in [place] DEPT ED from 09/30/2021 in [place] DEPT ED from 09/29/2021 in [place] DEPT  C-SSRS RISK CATEGORY High Risk High Risk Error: Q3, 4, or 5 should not be populated when Q2 is No       AIMS:  , , ,  ,   ASAM:    Substance Abuse:     Past Medical History:  Past Medical History:  Diagnosis Date   Anxiety    Bronchitis    COPD (chronic obstructive pulmonary disease) ([place])    Depression    History of radiation therapy    Left lung- 10/05/20-10/15/20- Dr. Gery Pray   Hypertension    lung ca 09/2020   MI (myocardial  infarction) ([place])    ????   OSA (obstructive sleep apnea)    Suicide attempt ([place])    Tension pneumothorax 06/27/2016   Uveitis     Past Surgical History:  Procedure Laterality Date   BIOPSY  07/03/2021   Procedure: BIOPSY;  Surgeon: Otis Brace, MD;  Location: [facility];  Service: Gastroenterology;;   BRONCHIAL BIOPSY  07/30/2020   Procedure: BRONCHIAL BIOPSIES;  Surgeon: Garner Nash, DO;  Location: [place] [facility];  Service: Pulmonary;;   BRONCHIAL BRUSHINGS  07/30/2020   Procedure: BRONCHIAL BRUSHINGS;  Surgeon: Garner Nash, DO;  Location: [facility];   Service: Pulmonary;;   BRONCHIAL NEEDLE ASPIRATION BIOPSY  07/30/2020   Procedure: BRONCHIAL NEEDLE ASPIRATION BIOPSIES;  Surgeon: Garner Nash, DO;  Location: [place];  Service: Pulmonary;;   BRONCHIAL WASHINGS  07/30/2020   Procedure: BRONCHIAL WASHINGS;  Surgeon: Garner Nash, DO;  Location: [place];  Service: Pulmonary;;   CHEST TUBE INSERTION Left 06/27/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     ESOPHAGOGASTRODUODENOSCOPY N/A 07/03/2021   Procedure: ESOPHAGOGASTRODUODENOSCOPY (EGD);  Surgeon: Otis Brace, MD;  Location: [name];  Service: Gastroenterology;  Laterality: N/A;   IR PERC PLEURAL DRAIN W/INDWELL CATH W/IMG GUIDE  08/04/2021   IR REMOVAL OF PLURAL CATH W/CUFF  09/01/2021   SKIN CANCER EXCISION     VIDEO BRONCHOSCOPY WITH ENDOBRONCHIAL NAVIGATION Left 07/30/2020   Procedure: VIDEO BRONCHOSCOPY WITH ENDOBRONCHIAL NAVIGATION;  Surgeon: Garner Nash, DO;  Location: [place];  Service: Pulmonary;  Laterality: Left;   Family History:  Family History  Problem Relation Age of Onset   Dementia Father    Family Psychiatric  History: unknown Social History:  Social History   Substance and Sexual Activity  Alcohol Use No   Alcohol/week: 0.0 standard drinks of alcohol   Comment: denies use of any drugs or alcohol     Social History   Substance and Sexual Activity  Drug Use No    Social History   Socioeconomic History   Marital status: Single    Spouse name: Not on file   Number of children: Not on file   Years of education: Not on file   Highest education level: Not on file  Occupational History   Occupation: retired  Tobacco Use   Smoking status: Former    Packs/day: 1.00    Years: 35.00    Total pack years: 35.00    Types: Cigarettes    Quit date: 05/2016    Years since quitting: 5.3   Smokeless tobacco: Never  Vaping Use   Vaping Use: Never used  Substance and Sexual Activity   Alcohol use: No    Alcohol/week: 0.0 standard drinks of alcohol     Comment: denies use of any drugs or alcohol   Drug use: No   Sexual activity: Not on file  Other Topics Concern   Not on file  Social History Narrative   Not on file   Social Determinants of Health   Financial Resource Strain: Low Risk  (09/23/2020)   Overall Financial Resource Strain (CARDIA)    Difficulty of Paying Living Expenses: Not hard at all  Food Insecurity: Not on file  Transportation Needs: No Transportation Needs (09/23/2020)   PRAPARE - Hydrologist (Medical): No    Lack of Transportation (Non-Medical): No  Physical Activity: Not on file  Stress: No Stress Concern Present (09/23/2020)   [employer] [name]  of Stress : Not at all  Social Connections: Not on file   Additional Social History:    Allergies:   Allergies  Allergen Reactions   Demerol [Meperidine] Nausea And Vomiting and Other (See Comments)    Made the patient "violently sick"   Zocor [Simvastatin] Nausea And Vomiting and Other (See Comments)    Made him very jittery, also   Beet [Beta Vulgaris] Nausea And Vomiting   Liver Nausea And Vomiting    Labs:  Results for orders placed or performed during the hospital encounter of 10/01/21 (from the past 48 hour(s))  Resp Panel by RT-PCR (Flu A&B, Covid) Anterior Nasal Swab     Status: None   Collection Time: 10/01/21  9:21 PM   Specimen: Anterior Nasal Swab  Result Value Ref Range   SARS Coronavirus 2 by RT PCR NEGATIVE NEGATIVE    Comment: (NOTE) SARS-CoV-2 target nucleic acids are NOT DETECTED.  The SARS-CoV-2 RNA is generally detectable in upper respiratory specimens during the acute phase of infection. The lowest concentration of SARS-CoV-2 viral copies this assay can detect is 138 copies/mL. A negative result does not preclude SARS-Cov-2 infection and should not be used as the sole basis for treatment or other patient management decisions. A negative  result may occur with  improper specimen collection/handling, submission of specimen other than nasopharyngeal swab, presence of viral mutation(s) within the areas targeted by this assay, and inadequate number of viral copies(<138 copies/mL). A negative result must be combined with clinical observations, patient history, and epidemiological information. The expected result is Negative.  Fact Sheet for Patients:  EntrepreneurPulse.com.au  Fact Sheet for Healthcare Providers:  IncredibleEmployment.be  This test is no t yet approved or cleared by the [place] FDA and  has been authorized for detection and/or diagnosis of SARS-CoV-2 by FDA under an Emergency Use Authorization (EUA). This EUA will remain  in effect (meaning this test can be used) for the duration of the COVID-19 declaration under Section 564(b)(1) of the Act, 21 U.S.C.section 360bbb-3(b)(1), unless the authorization is terminated  or revoked sooner.       Influenza A by PCR NEGATIVE NEGATIVE   Influenza B by PCR NEGATIVE NEGATIVE    Comment: (NOTE) The Xpert Xpress SARS-CoV-2/FLU/RSV plus assay is intended as an aid in the diagnosis of influenza from Nasopharyngeal swab specimens and should not be used as a sole basis for treatment. Nasal washings and aspirates are unacceptable for Xpert Xpress SARS-CoV-2/FLU/RSV testing.  Fact Sheet for Patients: EntrepreneurPulse.com.au  Fact Sheet for Healthcare Providers: IncredibleEmployment.be  This test is not yet approved or cleared by the [place] FDA and has been authorized for detection and/or diagnosis of SARS-CoV-2 by FDA under an Emergency Use Authorization (EUA). This EUA will remain in effect (meaning this test can be used) for the duration of the COVID-19 declaration under Section 564(b)(1) of the Act, 21 U.S.C. section 360bbb-3(b)(1), unless the authorization is terminated  or revoked.  Performed at [facility], [place] [address], [place], [place] [address]     Current Facility-Administered Medications  Medication Dose Route Frequency Provider Last Rate Last Admin   apixaban (ELIQUIS) tablet 5 mg  5 mg Oral BID Ripley Fraise, MD   5 mg at 10/02/21 0610   buPROPion (WELLBUTRIN XL) 24 hr tablet 300 mg  300 mg Oral q morning Ripley Fraise, MD   300 mg at 10/02/21 1054   busPIRone (BUSPAR) tablet 15 mg  15 mg Oral Daily Ripley Fraise, MD  15 mg at 10/02/21 1054   cyclobenzaprine (FLEXERIL) tablet 30 mg  30 mg Oral QHS Ripley Fraise, MD       diltiazem (CARDIZEM CD) 24 hr capsule 180 mg  180 mg Oral Daily Ripley Fraise, MD   180 mg at 10/02/21 1054   DULoxetine (CYMBALTA) DR capsule 60 mg  60 mg Oral BID Ripley Fraise, MD   60 mg at 10/02/21 1054   mycophenolate (CELLCEPT) capsule 1,500 mg  1,500 mg Oral BID Ripley Fraise, MD   1,500 mg at 10/02/21 1053   oxyCODONE (Oxy IR/ROXICODONE) immediate release tablet 5-10 mg  5-10 mg Oral Q4H PRN Ripley Fraise, MD       Current Outpatient Medications  Medication Sig Dispense Refill   albuterol (PROVENTIL) (2.5 MG/3ML) 0.083% nebulizer solution Inhale 3 mLs (2.5 mg total) by nebulization every 4 (four) hours as needed for wheezing or shortness of breath. (Patient taking differently: Take 2.5 mg by nebulization every 6 (six) hours as needed for wheezing or shortness of breath.) 90 mL 2   albuterol (VENTOLIN HFA) 108 (90 Base) MCG/ACT inhaler Inhale 2 puffs into the lungs every 6 (six) hours as needed for wheezing.     apixaban (ELIQUIS) 5 MG TABS tablet Take 5 mg by mouth 2 (two) times daily.     b complex vitamins capsule Take 1 capsule by mouth every morning.     buPROPion (WELLBUTRIN XL) 300 MG 24 hr tablet Take 300 mg by mouth every morning.     busPIRone (BUSPAR) 15 MG tablet Take 15 mg by mouth daily.     calcium-vitamin D (OSCAL WITH D) 500-200 MG-UNIT tablet Take 1 tablet by  mouth 2 (two) times daily with a meal.     Cholecalciferol (VITAMIN D-3) 25 MCG (1000 UT) CAPS Take 1,000 Units by mouth 2 (two) times daily.     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     clotrimazole-betamethasone (LOTRISONE) cream Apply 1 application. topically 2 (two) times daily as needed (fungus).     cyclobenzaprine (FLEXERIL) 10 MG tablet Take 30 mg by mouth at bedtime.     cycloSPORINE (RESTASIS) 0.05 % ophthalmic emulsion Place 1 drop into both eyes 2 (two) times daily.     diltiazem (CARDIZEM CD) 180 MG 24 hr capsule Take 180 mg by mouth daily.     docusate sodium (COLACE) 100 MG capsule Take 100 mg by mouth 2 (two) times daily.     DULoxetine (CYMBALTA) 60 MG capsule Take 1 capsule (60 mg total) by mouth 2 (two) times daily. 60 capsule 0   feeding supplement (ENSURE ENLIVE / ENSURE PLUS) LIQD Take 237 mLs by mouth 3 (three) times daily between meals. (Patient taking differently: Take 237 mLs by mouth 2 (two) times daily.) 10000 mL 0   fentaNYL (DURAGESIC) 12 MCG/HR Place 1 patch onto the skin every 3 (three) days. 10 patch 0   fluticasone (FLONASE) 50 MCG/ACT nasal spray Place 2 sprays into both nostrils daily.      folic acid (FOLVITE) 1 MG tablet Take 1 mg by mouth daily.     gabapentin (NEURONTIN) 300 MG capsule Take 1 capsule (300 mg total) by mouth 2 (two) times daily. (Patient taking differently: Take 900 mg by mouth at bedtime.) 20 capsule 0   GARLIC OIL PO Take 1 capsule by mouth daily.     lisinopril (ZESTRIL) 20 MG tablet Take 20  mg by mouth every morning.     lurasidone (LATUDA) 40 MG TABS tablet Take 40 mg by mouth daily after supper.     Melatonin 3 MG TABS Take 6 mg by mouth at bedtime.     Mometasone Furoate 200 MCG/ACT AERO Inhale 2 puffs into the lungs at bedtime as needed (shortness of breath/wheezing).     mycophenolate (CELLCEPT) 250 MG capsule Take 1,500 mg by mouth 2  (two) times daily.     oxcarbazepine (TRILEPTAL) 600 MG tablet Take 600 mg by mouth 3 (three) times daily.     oxyCODONE (OXY IR/ROXICODONE) 5 MG immediate release tablet Take 1-2 tablets (5-10 mg total) by mouth every 4 (four) hours as needed for severe pain. 30 tablet 0   oxyCODONE (OXYCONTIN) 15 mg 12 hr tablet Take 15 mg by mouth daily as needed (pain).     prednisoLONE acetate (PRED FORTE) 1 % ophthalmic suspension Place 1 drop into the left eye every hour while awake.     sodium chloride (OCEAN) 0.65 % SOLN nasal spray Place 2 sprays into both nostrils 4 (four) times daily as needed for congestion.     tamsulosin (FLOMAX) 0.4 MG CAPS capsule Take 0.8 mg by mouth daily.     Tiotropium Bromide-Olodaterol 2.5-2.5 MCG/ACT AERS Inhale 2 each into the lungs every morning. 2 puffs     traZODone (DESYREL) 50 MG tablet Take 75 mg by mouth at bedtime.     VITAMIN A PO Take 1 capsule by mouth daily.     vitamin C (ASCORBIC ACID) 500 MG tablet Take 500 mg by mouth 2 (two) times daily.     carboxymethylcellulose (REFRESH PLUS) 0.5 % SOLN Place 1 drop into both eyes 4 (four) times daily as needed (dry eyes).     guaiFENesin (MUCINEX) 600 MG 12 hr tablet Take 1 tablet (600 mg total) by mouth 2 (two) times daily as needed for cough. (Patient not taking: Reported on 10/01/2021) 180 tablet 0   hydrOXYzine (ATARAX) 10 MG tablet Take 1 tablet (10 mg total) by mouth 2 (two) times daily as needed for anxiety (30). (Patient not taking: Reported on 10/01/2021) 30 tablet 0   methotrexate (RHEUMATREX) 2.5 MG tablet Take 15 mg by mouth once a week. Caution:Chemotherapy. Protect from light. (Patient not taking: Reported on 09/24/2021)     nitroGLYCERIN (NITROSTAT) 0.4 MG SL tablet Place 1 tablet (0.4 mg total) under the tongue every 5 (five) minutes as needed for chest pain. (Patient not taking: Reported on 08/20/2021) 12 tablet 12   OXYGEN Inhale 2 L into the lungs continuous.     polyethylene glycol (MIRALAX / GLYCOLAX) 17 g  packet Take 17 g by mouth 2 (two) times daily. (Patient not taking: Reported on 10/01/2021)      Musculoskeletal: Strength & Muscle Tone:  seen bed  Gait & Station:  seen in bed lying down Patient leans:  see above   Psychiatric Specialty Exam: Presentation  General Appearance: Neat; Casual  Eye Contact:Fair  Speech:Clear and Coherent; Normal Rate  Speech Volume:Normal  Handedness:Right   Mood and Affect  Mood:Anxious; Depressed; Hopeless; Worthless  Affect:Congruent; Depressed   Thought Process  Thought Processes:Coherent; Goal Directed; Linear  Descriptions of Associations:Intact  Orientation:Full (Time, Place and Person)  Thought Content:Logical  History of Schizophrenia/Schizoaffective disorder:No data recorded Duration of Psychotic Symptoms:No data recorded Hallucinations:Hallucinations: None  Ideas of Reference:None  Suicidal Thoughts:Suicidal Thoughts: Yes, Active SI Active Intent and/or Plan: With Intent; With Plan; With Means to Carry  Out; With Access to Means  Homicidal Thoughts:Homicidal Thoughts: No   Sensorium  Memory:Immediate Good; Recent Good; Remote Good  Judgment:Fair  Insight:Good   Executive Functions  Concentration:Good  Attention Span:Good  [place] of Knowledge:Good  Language:Good   Psychomotor Activity  Psychomotor Activity:Psychomotor Activity: Normal   Assets  Assets:Communication Skills; Desire for Improvement; Financial Resources/Insurance; Housing    Sleep  Sleep:Sleep: Fair   Physical Exam: Physical Exam Vitals and nursing note reviewed.  Constitutional:      Appearance: He is obese.  HENT:     Head: Normocephalic and atraumatic.     Nose: Nose normal.  Cardiovascular:     Rate and Rhythm: Normal rate.  Pulmonary:     Effort: Pulmonary effort is normal.     Comments: Utilizes Oxygen Musculoskeletal:     Cervical back: Normal range of motion.  Skin:    General: Skin is warm and dry.   Neurological:     Mental Status: He is alert and oriented to person, place, and time.    Review of Systems  Constitutional: Negative.   HENT: Negative.    Eyes: Negative.   Respiratory:         Reports stage 4 Lung CA, Previous radion treatment.  Cardiovascular: Negative.   Gastrointestinal: Negative.   Genitourinary: Negative.   Musculoskeletal: Negative.   Skin: Negative.   Neurological: Negative.   Endo/Heme/Allergies: Negative.   Psychiatric/Behavioral:  Positive for depression and suicidal ideas. The patient is nervous/anxious and has insomnia.    Blood pressure (!) 156/81, pulse 99, temperature 99.2 F (37.3 C), temperature source Oral, resp. rate 18, SpO2 97 %. There is no height or weight on file to calculate BMI.  Medical Decision Making: Patient is depressed and suicidal,reported trigger as poor housing and suffering from stage 4 Lung Cancer.  He feels hopeless, helpless and worthless.  He meets criteria for inpatient Psychiatry treatment.  We will seek admission at inpatient Geropsychiatry unit.  Meanwhile home medications are resumed while in the ER.  Problem 1: Recurrent Major Depressive disorder without Psychotic features  Problem 2: Adjustment disorder with mixed emotion of conduction  Problem 3: Suicide ideation.  Disposition:  Admit, seek placement.  Delfin Gant, NP-PMHNP-BC 10/02/2021 12:30 PM

## 2021-10-02 NOTE — BH Assessment (Addendum)
Comprehensive Clinical Assessment (CCA) Note  10/02/2021 Mario [name] [id number] Disposition: Patient was seen yesterday by TTS and he was recommended for inpatient care at that time.  However when he was seen by provider during the day on 08/04, he said he was not suicidal and he got sent home.  Pt came back and is saying he is still suicidal with a plan to fall down some stairs to kill himself.    Clinician did discuss patient care with Quintella Reichert, NP.  She recommended inpatient care.  Clinician informed RN Elpidio Eric about the recommended disposition via secure messaging.  Pt is oriented and has good eye contact.  Pt is clear and concise in his statements.  Pt is not responding to internal stimuli nor is he evidencing any delusional thought content.  Patient reports poor sleep.  Pt is depressed about his health.  Pt has outpatient psychiatry through the [place].   Chief Complaint:  Chief Complaint  Patient presents with   Suicidal   Visit Diagnosis: MDD recurrent, severe    CCA Screening, Triage and Referral (STR)  Patient Reported Information How did you hear about [place]? Self  What Is the Reason for Your Visit/Call Today? Pt had been psych cleared on 08/04 and he went home.  Pt is back this evening to [facility] with thoughts of throwing himself down steps to kill himself.  Pt says that he had told NP Luci Bank that he was okay.  When asked what had changed from then to now.  He said "Me" patient says that he is "good at putting on a mask" when esked about suicidality.  Pt says that he is okay with going inpatient at [facility] for mental health care.  Pt says he feels like he would not be safe at home now (as far as being suicidal).  Patient has had a previous suicide attempt 25 years ago.  Pt denies any HI or A/V hallucinations.  Pt denies any use of ETOH or other substances.  No weapons at the home.  Pt says he has been off his medications for a week due to not being able to find them  after his recent move.  How Long Has This Been Causing You Problems? <Week  What Do You Feel Would Help You the Most Today? Treatment for Depression or other mood problem   Have You Recently Had Any Thoughts About Hurting Yourself? Yes  Are You Planning to Commit Suicide/Harm Yourself At This time? Yes   Have you Recently Had Thoughts About Hurting Someone [place] Dawn? No  Are You Planning to Harm Someone at This Time? No  Explanation: No data recorded  Have You Used Any Alcohol or Drugs in the Past 24 Hours? No  How Long Ago Did You Use Drugs or Alcohol? No data recorded What Did You Use and How Much? No data recorded  Do You Currently Have a Therapist/Psychiatrist? No  Name of Therapist/Psychiatrist: No data recorded  Have You Been Recently Discharged From Any Office Practice or Programs? No  Explanation of Discharge From Practice/Program: No data recorded    CCA Screening Triage Referral Assessment Type of Contact: Tele-Assessment  Telemedicine Service Delivery: Telemedicine service delivery: This service was provided via telemedicine using a 2-way, interactive audio and video technology  Is this Initial or Reassessment? Initial Assessment  Date Telepsych consult ordered in [facility]:  10/01/21  Time Telepsych consult ordered in [facility]:  1933  Location of Assessment: [facility] ED  Provider Location: [facility]  Assessment Services   Collateral Involvement: Pt reports, if he wants to talk to them. Pt reports, he does want to worry his pregnant daughter, she was recently in the hospital.   Does Patient Have a Court Appointed Legal Guardian? No data recorded Name and Contact of Legal Guardian: No data recorded If Minor and Not Living with Parent(s), Who has Custody? No data recorded Is CPS involved or ever been involved? Never  Is APS involved or ever been involved? Never   Patient Determined To Be At Risk for Harm To Self or Others Based on Review of Patient Reported Information or  Presenting Complaint? Yes, for Self-Harm  Method: No data recorded Availability of Means: No data recorded Intent: No data recorded Notification Required: No data recorded Additional Information for Danger to Others Potential: No data recorded Additional Comments for Danger to Others Potential: No data recorded Are There Guns or Other Weapons in Your Home? No data recorded Types of Guns/Weapons: No data recorded Are These Weapons Safely Secured?                            No data recorded Who Could Verify You Are Able To Have These Secured: No data recorded Do You Have any Outstanding Charges, Pending Court Dates, Parole/Probation? No data recorded Contacted To Inform of Risk of Harm To Self or Others: No data recorded   Does Patient Present under Involuntary Commitment? No  IVC Papers Initial File Date: No data recorded  [place] of Residence: [place]   Patient Currently Receiving the Following Services: No data recorded  Determination of Need: Emergent (2 hours)   Options For Referral: Inpatient Hospitalization     CCA Biopsychosocial Patient Reported Schizophrenia/Schizoaffective Diagnosis in Past: No data recorded  Strengths: No data recorded  Mental Health Symptoms Depression:   Difficulty Concentrating; Fatigue; Hopelessness; Sleep (too much or little) (Despondent.)   Duration of Depressive symptoms:    Mania:   None   Anxiety:    Worrying; Tension   Psychosis:   None   Duration of Psychotic symptoms:    Trauma:   None   Obsessions:   None   Compulsions:  No data recorded  Inattention:   -- (Pt reports, he can't find stuff.)   Hyperactivity/Impulsivity:   None   Oppositional/Defiant Behaviors:   None   Emotional Irregularity:  No data recorded  Other Mood/Personality Symptoms:  No data recorded   Mental Status Exam Appearance and self-care  Stature:   Tall   Weight:   Obese   Clothing:   -- (Pt in hospital gown.)   Grooming:    Normal   Cosmetic use:   None   Posture/gait:   Normal   Motor activity:   Not Remarkable   Sensorium  Attention:   Normal   Concentration:   Normal   Orientation:   X5   Recall/memory:   Normal   Affect and Mood  Affect:   Depressed   Mood:   Depressed   Relating  Eye contact:   Normal   Facial expression:   Responsive   Attitude toward examiner:   Cooperative   Thought and Language  Speech flow:  Normal   Thought content:   Appropriate to Mood and Circumstances   Preoccupation:   None   Hallucinations:   None   Organization:  No data recorded  [employer] of Knowledge:   Fair   Intelligence:  Average   Abstraction:  No data recorded  Judgement:   Poor   Reality Testing:  No data recorded  Insight:   Fair   Decision Making:   Impulsive   Social Functioning  Social Maturity:   Impulsive   Social Judgement:   Heedless   Stress  Stressors:   Housing   Coping Ability:   Overwhelmed   Skill Deficits:   None   Supports:   Family     Religion:    Leisure/Recreation:    Exercise/Diet:     CCA Employment/Education Employment/Work Situation:    Education:     CCA Family/Childhood History Family and Relationship History:    Childhood History:     Child/Adolescent Assessment:     CCA Substance Use Alcohol/Drug Use:                           ASAM's:  Six Dimensions of Multidimensional Assessment  Dimension 1:  Acute Intoxication and/or Withdrawal Potential:      Dimension 2:  Biomedical Conditions and Complications:      Dimension 3:  Emotional, Behavioral, or Cognitive Conditions and Complications:     Dimension 4:  Readiness to Change:     Dimension 5:  Relapse, Continued use, or Continued Problem Potential:     Dimension 6:  Recovery/Living Environment:     ASAM Severity Score:    ASAM Recommended Level of Treatment:     Substance use Disorder (SUD)     Recommendations for Services/Supports/Treatments:    Discharge Disposition:    DSM5 Diagnoses: Patient Active Problem List   Diagnosis Date Noted   Acute pulmonary edema ([place]) 09/23/2021   BPH (benign prostatic hyperplasia) 09/23/2021   Pulmonary edema cardiac cause ([place]) 09/23/2021   Fracture of ribs, two, left, sequela 08/24/2021   Pleural effusion, left 08/20/2021   Metastatic carcinoma ([place]) 08/07/2021   Non-small cell lung cancer, left ([place]) 08/07/2021   Chronic respiratory failure with hypoxia ([place]) 08/07/2021   Rib pain 08/03/2021   Encounter for antineoplastic chemotherapy 07/20/2021   Leg paresthesia 06/17/2021   Obesity, Class III, BMI 40-49.9 (morbid obesity) ([place]) 06/17/2021   Pneumonia 06/17/2021   Hypertension    Allergic rhinitis    History of pulmonary embolism    Hyponatremia 05/20/2021   Mood disorder ([place]) 02/03/2021   Non-small cell cancer of left lung ([place]) 09/21/2020   Lung nodule 07/30/2020   Mass of left lung    Constipation    COPD with acute exacerbation ([place]) 01/05/2020   Vertigo 12/26/2019   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   Stage 3a chronic kidney disease ([place]) 12/26/2019   Abnormal CT of the chest 12/26/2019   COPD (chronic obstructive pulmonary disease) ([facility])    Hyperglycemia 06/23/2019   OSA (obstructive sleep apnea) 06/23/2019   Suicidal ideation 06/23/2019   Major depressive disorder, recurrent episode ([place]) 10/12/2018   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   Cellulitis of both feet 07/16/2017   DM2 (diabetes mellitus, type 2) ([place]) 07/16/2017   HLA B27 (HLA B27 positive) 07/16/2017   Hyperkalemia    Hypoxia    Dyspnea 05/23/2014   Malingering 01/21/2013   Depression 01/11/2013   Tobacco abuse 01/11/2013   Obesity, unspecified 01/11/2013   Hypertension associated with diabetes ([place]) 01/10/2013     Referrals to Alternative Service(s): Referred to Alternative Service(s):   Place:    Date:   Time:    Referred  to Alternative Service(s):   Place:   Date:   Time:    Referred to Alternative Service(s):   Place:   Date:   Time:    Referred to Alternative Service(s):   Place:   Date:   Time:     Waldron Session

## 2021-10-02 NOTE — ED Provider Notes (Signed)
Emergency Medicine Observation Re-evaluation Note  Mario [name] is a 71 y.o. male, seen on rounds today.  Pt initially presented to the ED for complaints of Suicidal Currently, the patient is sleeping.  Physical Exam  BP (!) 177/88   Pulse (!) 115   Temp 98.3 F (36.8 C) (Oral)   Resp (!) 22   SpO2 96%  Physical Exam General: Sleeping Cardiac: Extremities are perfused Lungs: Breathing is unlabored Psych: Deferred  ED Course / MDM  EKG:EKG Interpretation  Date/Time:  Friday October 01 2021 21:16:44 EDT Ventricular Rate:  100 PR Interval:  203 QRS Duration: 114 QT Interval:  357 QTC Calculation: 461 R Axis:   -20 Text Interpretation: Sinus tachycardia Borderline intraventricular conduction delay No significant change since last tracing Confirmed by Ripley Fraise [phone]) on 10/02/2021 2:13:56 AM  I have reviewed the labs performed to date as well as medications administered while in observation.  Recent changes in the last 24 hours include patient presented to the ED last night for suicidal ideation.  He is medically cleared.  He has been evaluated by TTS and they recommend inpatient care.  Plan  Current plan is for inpatient psychiatric admission.  Mario [name] is not under involuntary commitment.     Godfrey Pick, MD 10/02/21 [phone]

## 2021-10-02 NOTE — ED Provider Notes (Signed)
At patient request, I have updated his home meds.  He still takes Eliquis.  He also reports he takes CellCept. Pain meds and Flexeril also been ordered as well as his antidepressant   Ripley Fraise, MD 10/02/21 [phone]

## 2021-10-03 MED ORDER — ACETAMINOPHEN 325 MG PO TABS
650.0000 mg | ORAL_TABLET | Freq: Four times a day (QID) | ORAL | Status: DC | PRN
  Administered 2021-10-03: 650 mg via ORAL
  Filled 2021-10-03: qty 2

## 2021-10-03 MED ORDER — ONDANSETRON 4 MG PO TBDP
4.0000 mg | ORAL_TABLET | Freq: Three times a day (TID) | ORAL | Status: DC | PRN
  Administered 2021-10-03: 4 mg via ORAL
  Filled 2021-10-03: qty 1

## 2021-10-03 MED ORDER — FENTANYL 12 MCG/HR TD PT72
1.0000 | MEDICATED_PATCH | TRANSDERMAL | Status: DC
  Administered 2021-10-03: 1 via TRANSDERMAL
  Filled 2021-10-03: qty 1

## 2021-10-03 MED ORDER — GABAPENTIN 300 MG PO CAPS
900.0000 mg | ORAL_CAPSULE | Freq: Every day | ORAL | Status: DC
  Administered 2021-10-03: 900 mg via ORAL
  Filled 2021-10-03: qty 3

## 2021-10-03 NOTE — ED Provider Notes (Signed)
Emergency Medicine Observation Re-evaluation Note  Mario [name] is a 71 y.o. male, seen on rounds today.  Pt initially presented to the ED for complaints of Suicidal Currently, the patient is sleeping.  Physical Exam  BP 137/84 (BP Location: Left Arm)   Pulse 94   Temp 98.4 F (36.9 C) (Oral)   Resp 18   SpO2 99%  Physical Exam General: Sleeping Cardiac: Extremities are well-perfused Lungs: Breathing is unlabored Psych: Deferred  ED Course / MDM  EKG:EKG Interpretation  Date/Time:  Friday October 01 2021 21:16:44 EDT Ventricular Rate:  100 PR Interval:  203 QRS Duration: 114 QT Interval:  357 QTC Calculation: 461 R Axis:   -20 Text Interpretation: Sinus tachycardia Borderline intraventricular conduction delay No significant change since last tracing Confirmed by Ripley Fraise [phone]) on 10/02/2021 2:13:56 AM  I have reviewed the labs performed to date as well as medications administered while in observation.  Recent changes in the last 24 hours include none.  Plan  Current plan is for psychiatric admission.  DEVERICK PRUSS is not under involuntary commitment.     Godfrey Pick, MD 10/03/21 1328

## 2021-10-03 NOTE — Progress Notes (Signed)
[facility] Psych ED Progress Note  10/03/2021 2:55 PM JAVID KEMLER  MRN:  [id number]   Subjective:  Mario [name] is a 71 y.o. male patient admitted with previous hx significant for Depression , anxiety and suicide ideation .came back to the ER after discharge yesterday.  This time patient plans to OD on his pills-Muscle relaxant. Today patient vehemently denies feeling suicidal.  He states that he has good chances beating depression and Lung CA.Mario [name]  Patient want to move to [facility] to seek care but have been told that he need to be stable before we discharge him.  Plan is to call [facility] in [place] to see if there is bed.  He is receiving his Medications as prescribed and he is using Oxygen as well.  Patient states he has Oxygen at home and has one for travel.  He denies SI/HI/AVH and no mention of paranoia. Principal Problem: Major depressive disorder, recurrent episode ([name]) Diagnosis:  Principal Problem:   Major depressive disorder, recurrent episode ([name]) Active Problems:   Adjustment disorder with mixed disturbance of emotions and conduct   ED Assessment Time Calculation: No data recorded  Past Psychiatric History: see initial Psych evaluation  [name]:  [name] from 10/01/2021 in [place]PT ED from 09/30/2021 in [name] ED from 09/29/2021 in [name]  C-SSRS RISK CATEGORY High Risk High Risk Error: Q3, 4, or 5 should not be populated when Q2 is No       Past Medical History:  Past Medical History:  Diagnosis Date   Anxiety    Bronchitis    COPD (chronic obstructive pulmonary disease) (HCC)    Depression    History of radiation therapy    Left lung- 10/05/20-10/15/20- Dr. Gery Pray   Hypertension    lung ca 09/2020   MI (myocardial infarction) [facility])    ????   OSA (obstructive sleep apnea)    Suicide attempt ([name])    Tension pneumothorax 06/27/2016   Uveitis      Past Surgical History:  Procedure Laterality Date   BIOPSY  07/03/2021   Procedure: BIOPSY;  Surgeon: Otis Brace, MD;  Location: [facility];  Service: Gastroenterology;;   BRONCHIAL BIOPSY  07/30/2020   Procedure: BRONCHIAL BIOPSIES;  Surgeon: Garner Nash, DO;  Location: [facility];  Service: Pulmonary;;   BRONCHIAL BRUSHINGS  07/30/2020   Procedure: BRONCHIAL BRUSHINGS;  Surgeon: Garner Nash, DO;  Location: [facility];  Service: Pulmonary;;   BRONCHIAL NEEDLE ASPIRATION BIOPSY  07/30/2020   Procedure: BRONCHIAL NEEDLE ASPIRATION BIOPSIES;  Surgeon: Garner Nash, DO;  Location: [name];  Service: Pulmonary;;   BRONCHIAL WASHINGS  07/30/2020   Procedure: BRONCHIAL WASHINGS;  Surgeon: Garner Nash, DO;  Location: [name];  Service: Pulmonary;;   CHEST TUBE INSERTION Left 06/27/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     ESOPHAGOGASTRODUODENOSCOPY N/A 07/03/2021   Procedure: ESOPHAGOGASTRODUODENOSCOPY (EGD);  Surgeon: Otis Brace, MD;  Location: [name];  Service: Gastroenterology;  Laterality: N/A;   IR PERC PLEURAL DRAIN W/INDWELL CATH W/IMG GUIDE  08/04/2021   IR REMOVAL OF PLURAL CATH W/CUFF  09/01/2021   SKIN CANCER EXCISION     VIDEO BRONCHOSCOPY WITH ENDOBRONCHIAL NAVIGATION Left 07/30/2020   Procedure: VIDEO BRONCHOSCOPY WITH ENDOBRONCHIAL NAVIGATION;  Surgeon: Garner Nash, DO;  Location: [place];  Service: Pulmonary;  Laterality: Left;   Family History:  Family History  Problem Relation Age of Onset   Dementia Father  Family Psychiatric  History: see initial Psych evaluation. Social History:  Social History   Substance and Sexual Activity  Alcohol Use No   Alcohol/week: 0.0 standard drinks of alcohol   Comment: denies use of any drugs or alcohol     Social History   Substance and Sexual Activity  Drug Use No    Social History   Socioeconomic History   Marital status: Single    Spouse name: Not on file   Number of children: Not on  file   Years of education: Not on file   Highest education level: Not on file  Occupational History   Occupation: retired  Tobacco Use   Smoking status: Former    Packs/day: 1.00    Years: 35.00    Total pack years: 35.00    Types: Cigarettes    Quit date: 05/2016    Years since quitting: 5.3   Smokeless tobacco: Never  Vaping Use   Vaping Use: Never used  Substance and Sexual Activity   Alcohol use: No    Alcohol/week: 0.0 standard drinks of alcohol    Comment: denies use of any drugs or alcohol   Drug use: No   Sexual activity: Not on file  Other Topics Concern   Not on file  Social History Narrative   Not on file   Social Determinants of Health   Financial Resource Strain: Low Risk  (09/23/2020)   Overall Financial Resource Strain (CARDIA)    Difficulty of Paying Living Expenses: Not hard at all  Food Insecurity: Not on file  Transportation Needs: No Transportation Needs (09/23/2020)   PRAPARE - Hydrologist (Medical): No    Lack of Transportation (Non-Medical): No  Physical Activity: Not on file  Stress: No Stress Concern Present (09/23/2020)   [employer] [place]    Feeling of Stress : Not at all  Social Connections: Not on file    Sleep: Good  Appetite:  Good  Current Medications: Current Facility-Administered Medications  Medication Dose Route Frequency Provider Last Rate Last Admin   acetaminophen (TYLENOL) tablet 650 mg  650 mg Oral Q6H PRN Godfrey Pick, MD   650 mg at 10/03/21 0935   apixaban (ELIQUIS) tablet 5 mg  5 mg Oral BID Ripley Fraise, MD   5 mg at 10/03/21 0935   buPROPion (WELLBUTRIN XL) 24 hr tablet 300 mg  300 mg Oral q morning Ripley Fraise, MD   300 mg at 10/03/21 0936   busPIRone (BUSPAR) tablet 15 mg  15 mg Oral Daily Ripley Fraise, MD   15 mg at 10/03/21 0935   cyclobenzaprine (FLEXERIL) tablet 30 mg  30 mg Oral QHS Ripley Fraise, MD   30 mg at  10/02/21 2215   diltiazem (CARDIZEM CD) 24 hr capsule 180 mg  180 mg Oral Daily Ripley Fraise, MD   180 mg at 10/03/21 0936   DULoxetine (CYMBALTA) DR capsule 60 mg  60 mg Oral BID Ripley Fraise, MD   60 mg at 10/03/21 0936   fentaNYL (DURAGESIC) 12 MCG/HR 1 patch  1 patch Transdermal Q72H Godfrey Pick, MD   1 patch at 10/03/21 1028   gabapentin (NEURONTIN) capsule 900 mg  900 mg Oral QHS Godfrey Pick, MD       mycophenolate (CELLCEPT) capsule 1,500 mg  1,500 mg Oral BID Ripley Fraise, MD   1,500 mg at 10/03/21 0936   oxyCODONE (Oxy IR/ROXICODONE) immediate release tablet 5-10 mg  5-10 mg Oral Q4H PRN Ripley Fraise, MD   10 mg at 10/03/21 1158   Current Outpatient Medications  Medication Sig Dispense Refill   albuterol (PROVENTIL) (2.5 MG/3ML) 0.083% nebulizer solution Inhale 3 mLs (2.5 mg total) by nebulization every 4 (four) hours as needed for wheezing or shortness of breath. (Patient taking differently: Take 2.5 mg by nebulization every 6 (six) hours as needed for wheezing or shortness of breath.) 90 mL 2   albuterol (VENTOLIN HFA) 108 (90 Base) MCG/ACT inhaler Inhale 2 puffs into the lungs every 6 (six) hours as needed for wheezing.     apixaban (ELIQUIS) 5 MG TABS tablet Take 5 mg by mouth 2 (two) times daily.     b complex vitamins capsule Take 1 capsule by mouth every morning.     buPROPion (WELLBUTRIN XL) 300 MG 24 hr tablet Take 300 mg by mouth every morning.     busPIRone (BUSPAR) 15 MG tablet Take 15 mg by mouth daily.     calcium-vitamin D (OSCAL WITH D) 500-200 MG-UNIT tablet Take 1 tablet by mouth 2 (two) times daily with a meal.     Cholecalciferol (VITAMIN D-3) 25 MCG (1000 UT) CAPS Take 1,000 Units by mouth 2 (two) times daily.     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     clotrimazole-betamethasone (LOTRISONE) cream Apply 1 application. topically 2 (two) times daily as  needed (fungus).     cyclobenzaprine (FLEXERIL) 10 MG tablet Take 30 mg by mouth at bedtime.     cycloSPORINE (RESTASIS) 0.05 % ophthalmic emulsion Place 1 drop into both eyes 2 (two) times daily.     diltiazem (CARDIZEM CD) 180 MG 24 hr capsule Take 180 mg by mouth daily.     docusate sodium (COLACE) 100 MG capsule Take 100 mg by mouth 2 (two) times daily.     DULoxetine (CYMBALTA) 60 MG capsule Take 1 capsule (60 mg total) by mouth 2 (two) times daily. 60 capsule 0   feeding supplement (ENSURE ENLIVE / ENSURE PLUS) LIQD Take 237 mLs by mouth 3 (three) times daily between meals. (Patient taking differently: Take 237 mLs by mouth 2 (two) times daily.) 10000 mL 0   fentaNYL (DURAGESIC) 12 MCG/HR Place 1 patch onto the skin every 3 (three) days. 10 patch 0   fluticasone (FLONASE) 50 MCG/ACT nasal spray Place 2 sprays into both nostrils daily.      folic acid (FOLVITE) 1 MG tablet Take 1 mg by mouth daily.     gabapentin (NEURONTIN) 300 MG capsule Take 1 capsule (300 mg total) by mouth 2 (two) times daily. (Patient taking differently: Take 900 mg by mouth at bedtime.) 20 capsule 0   GARLIC OIL PO Take 1 capsule by mouth daily.     lisinopril (ZESTRIL) 20 MG tablet Take 20 mg by mouth every morning.     lurasidone (LATUDA) 40 MG TABS tablet Take 40 mg by mouth daily after supper.     Melatonin 3 MG TABS Take 6 mg by mouth at bedtime.     Mometasone Furoate 200 MCG/ACT AERO Inhale 2 puffs into the lungs at bedtime as needed (shortness of breath/wheezing).     mycophenolate (CELLCEPT) 250 MG capsule Take 1,500 mg by mouth 2 (two) times daily.     oxcarbazepine (TRILEPTAL) 600 MG tablet Take 600 mg by mouth 3 (three) times daily.     oxyCODONE (OXY  IR/ROXICODONE) 5 MG immediate release tablet Take 1-2 tablets (5-10 mg total) by mouth every 4 (four) hours as needed for severe pain. 30 tablet 0   oxyCODONE (OXYCONTIN) 15 mg 12 hr tablet Take 15 mg by mouth daily as needed (pain).     prednisoLONE acetate  (PRED FORTE) 1 % ophthalmic suspension Place 1 drop into the left eye every hour while awake.     sodium chloride (OCEAN) 0.65 % SOLN nasal spray Place 2 sprays into both nostrils 4 (four) times daily as needed for congestion.     tamsulosin (FLOMAX) 0.4 MG CAPS capsule Take 0.8 mg by mouth daily.     Tiotropium Bromide-Olodaterol 2.5-2.5 MCG/ACT AERS Inhale 2 each into the lungs every morning. 2 puffs     traZODone (DESYREL) 50 MG tablet Take 75 mg by mouth at bedtime.     VITAMIN A PO Take 1 capsule by mouth daily.     vitamin C (ASCORBIC ACID) 500 MG tablet Take 500 mg by mouth 2 (two) times daily.     carboxymethylcellulose (REFRESH PLUS) 0.5 % SOLN Place 1 drop into both eyes 4 (four) times daily as needed (dry eyes).     guaiFENesin (MUCINEX) 600 MG 12 hr tablet Take 1 tablet (600 mg total) by mouth 2 (two) times daily as needed for cough. (Patient not taking: Reported on 10/01/2021) 180 tablet 0   hydrOXYzine (ATARAX) 10 MG tablet Take 1 tablet (10 mg total) by mouth 2 (two) times daily as needed for anxiety (30). (Patient not taking: Reported on 10/01/2021) 30 tablet 0   methotrexate (RHEUMATREX) 2.5 MG tablet Take 15 mg by mouth once a week. Caution:Chemotherapy. Protect from light. (Patient not taking: Reported on 09/24/2021)     nitroGLYCERIN (NITROSTAT) 0.4 MG SL tablet Place 1 tablet (0.4 mg total) under the tongue every 5 (five) minutes as needed for chest pain. (Patient not taking: Reported on 08/20/2021) 12 tablet 12   OXYGEN Inhale 2 L into the lungs continuous.     polyethylene glycol (MIRALAX / GLYCOLAX) 17 g packet Take 17 g by mouth 2 (two) times daily. (Patient not taking: Reported on 10/01/2021)      Lab Results:  Results for orders placed or performed during the hospital encounter of 10/01/21 (from the past 48 hour(s))  Resp Panel by RT-PCR (Flu A&B, Covid) Anterior Nasal Swab     Status: None   Collection Time: 10/01/21  9:21 PM   Specimen: Anterior Nasal Swab  Result Value Ref  Range   SARS Coronavirus 2 by RT PCR NEGATIVE NEGATIVE    Comment: (NOTE) SARS-CoV-2 target nucleic acids are NOT DETECTED.  The SARS-CoV-2 RNA is generally detectable in upper respiratory specimens during the acute phase of infection. The lowest concentration of SARS-CoV-2 viral copies this assay can detect is 138 copies/mL. A negative result does not preclude SARS-Cov-2 infection and should not be used as the sole basis for treatment or other patient management decisions. A negative result may occur with  improper specimen collection/handling, submission of specimen other than nasopharyngeal swab, presence of viral mutation(s) within the areas targeted by this assay, and inadequate number of viral copies(<138 copies/mL). A negative result must be combined with clinical observations, patient history, and epidemiological information. The expected result is Negative.  Fact Sheet for Patients:  EntrepreneurPulse.com.au  Fact Sheet for Healthcare Providers:  IncredibleEmployment.be  This test is no t yet approved or cleared by the [place] FDA and  has been authorized for detection  and/or diagnosis of SARS-CoV-2 by FDA under an Emergency Use Authorization (EUA). This EUA will remain  in effect (meaning this test can be used) for the duration of the COVID-19 declaration under Section 564(b)(1) of the Act, 21 U.S.C.section 360bbb-3(b)(1), unless the authorization is terminated  or revoked sooner.       Influenza A by PCR NEGATIVE NEGATIVE   Influenza B by PCR NEGATIVE NEGATIVE    Comment: (NOTE) The Xpert Xpress SARS-CoV-2/FLU/RSV plus assay is intended as an aid in the diagnosis of influenza from Nasopharyngeal swab specimens and should not be used as a sole basis for treatment. Nasal washings and aspirates are unacceptable for Xpert Xpress SARS-CoV-2/FLU/RSV testing.  Fact Sheet for  Patients: EntrepreneurPulse.com.au  Fact Sheet for Healthcare Providers: IncredibleEmployment.be  This test is not yet approved or cleared by the [place] FDA and has been authorized for detection and/or diagnosis of SARS-CoV-2 by FDA under an Emergency Use Authorization (EUA). This EUA will remain in effect (meaning this test can be used) for the duration of the COVID-19 declaration under Section 564(b)(1) of the Act, 21 U.S.C. section 360bbb-3(b)(1), unless the authorization is terminated or revoked.  Performed at [facility], [place] [address], [place], [place] [address]     Blood Alcohol level:  Lab Results  Component Value Date   [facility] <10 04/11/2020   ETH <10 07/11/2019    Physical Findings:  CIWA:    COWS:     Musculoskeletal: Strength & Muscle Tone: within normal limits Gait & Station: normal Patient leans: Front  Psychiatric Specialty Exam:  Presentation  General Appearance: Casual; Fairly Groomed; Other (comment) (obese)  Eye Contact:Good  Speech:Clear and Coherent; Normal Rate  Speech Volume:Normal  Handedness:Right   Mood and Affect  Mood:Anxious; Depressed  Affect:Congruent; Depressed   Thought Process  Thought Processes:Coherent; Goal Directed; Linear  Descriptions of Associations:Intact  Orientation:Full (Time, Place and Person)  Thought Content:Logical  History of Schizophrenia/Schizoaffective disorder:No data recorded Duration of Psychotic Symptoms:No data recorded Hallucinations:Hallucinations: None  Ideas of Reference:None  Suicidal Thoughts:Suicidal Thoughts: No SI Active Intent and/or Plan: With Intent; With Plan; With Means to [place]; With Access to Means  Homicidal Thoughts:Homicidal Thoughts: No   Sensorium  Memory:Immediate Good; Recent Good; Remote Good  Judgment:Good  Insight:Good   Executive Functions  Concentration:Good  Attention  Span:Good  [place] of Knowledge:Good  Language:Good   Psychomotor Activity  Psychomotor Activity:Psychomotor Activity: Normal   Assets  Assets:Communication Skills; Desire for Improvement; Housing; Social Support   Sleep  Sleep:Sleep: Good    Physical Exam: Physical Exam ROS Blood pressure 132/87, pulse 99, temperature 97.7 F (36.5 C), temperature source Oral, resp. rate 16, SpO2 96 %. There is no height or weight on file to calculate BMI.   Medical Decision Making: Patient continues to meet criteria for inpatient admission but prefers to get care from [facility].  Patient denies SI/HI/AVH and he continues to take his medications.  We will attempt to contact [facility] for available beds.  Problem 1: Recurrent Major Depressive disorder without Psychotic features Problem 2: Adjustment disorder with mixed disturbance of  emotion of conduct Delfin Gant, NP-PMHNP-BC 10/03/2021, 2:55 PM

## 2021-10-03 NOTE — Progress Notes (Signed)
CSW followed up with [facility]. AOD advised that they are on diversion and are not accepting any new referrals at this time.    Glennie Isle, MSW, [facility] [place] Phone: [phone] Disposition/TOC

## 2021-10-03 NOTE — Progress Notes (Signed)
CSW followed up with [facility]. AOD reports that they are on Diversion.    Glennie Isle, MSW, [facility] [place] Phone: [phone] Disposition/TOC

## 2021-10-03 NOTE — Progress Notes (Signed)
CSW followed up with [facility] with [facility] regarding referral sent for possible placement. It was reported that there are currently no beds available at this time.   Glennie Isle, MSW, [facility] [place] Phone: [phone] Disposition/TOC

## 2021-10-03 NOTE — Progress Notes (Signed)
Per Quintella Reichert, NP, patient meets criteria for inpatient treatment. There are no available beds at [facility] today. CSW faxed referrals to the following facilities for review:  [address] [place] [address] [place] --  [facility]  Pending - Request Sent N/A [address] [place] [address] [phone] --  [place] N/A [address] [place], [place] [address] [phone] --  [place] [address] [place] [address] [phone] --  [place] [address], [place] [address] [phone] --  [place] [address] [place] [address] [phone] --  [place] [address] [place] [address] [phone] --  [facility]  Pending - No Request Sent N/A [place] [place] [address] [phone] --  [place]  Pending - No Request Sent N/A [place] [place] [address] [phone] --  [facility]  Pending - No Request Sent N/A [address], [place] [address] [phone] --  [facility]  Pending - No Request Sent N/A [place], [place] [address] [phone] --   TTS will continue to seek bed placement.  Glennie Isle, MSW, [facility] [place] Phone: [phone] Disposition/TOC

## 2021-10-03 NOTE — ED Notes (Signed)
Patient c/o nausea.  PRN order obtain from MD.  Patient medicated at this time.  Will reassess shortly

## 2021-10-03 NOTE — Progress Notes (Signed)
CSW followed up with [facility]. AOD advised that they are on capacity alert and is not accepting any referrals at this time.   Glennie Isle, MSW, [place] [place] Phone: [phone] Disposition/TOC

## 2021-10-04 DIAGNOSIS — F332 Major depressive disorder, recurrent severe without psychotic features: Secondary | ICD-10-CM

## 2021-10-04 DIAGNOSIS — F4325 Adjustment disorder with mixed disturbance of emotions and conduct: Secondary | ICD-10-CM

## 2021-10-04 MED ORDER — DULOXETINE HCL 60 MG PO CPEP: 60.0000 mg | ORAL_CAPSULE | Freq: Two times a day (BID) | ORAL | 0 refills | Status: DC

## 2021-10-04 MED ORDER — ONDANSETRON 4 MG PO TBDP: 4.0000 mg | ORAL_TABLET | Freq: Three times a day (TID) | ORAL | 0 refills | Status: DC | PRN

## 2021-10-04 MED ORDER — ACETAMINOPHEN 325 MG PO TABS
650.0000 mg | ORAL_TABLET | Freq: Four times a day (QID) | ORAL | 0 refills | Status: AC | PRN
Start: 2021-10-04 — End: 2021-10-19

## 2021-10-04 MED ORDER — GABAPENTIN 300 MG PO CAPS: 900.0000 mg | ORAL_CAPSULE | Freq: Every day | ORAL | 0 refills | Status: DC

## 2021-10-04 NOTE — BH Assessment (Addendum)
[facility] Assessment Progress Note   Per Charmaine Downs, NP, this pt does not require psychiatric hospitalization at this time.  Pt presents under IVC initiated by EDP Campbell Stall, DO which has been rescinded by EDP Malvin Johns, MD.  Pt is psychiatrically cleared.  Discharge instructions advise pt to continue treatment with the [facility]  Dr Tamera Punt and pt's nurses, Sharrie Rothman and [place], have been notified.  Jalene Mullet, [place] Triage Specialist [phone]

## 2021-10-04 NOTE — ED Notes (Signed)
[facility] psych

## 2021-10-04 NOTE — ED Notes (Signed)
Patient was asking for pain medication.  Once RN went into room, patient was sleeping.

## 2021-10-04 NOTE — ED Provider Notes (Signed)
Emergency Medicine Observation Re-evaluation Note  Mario [name] is a 71 y.o. male, seen on rounds today.  Pt initially presented to the ED for complaints of Suicidal Currently, the patient is eating breakfast.  Physical Exam  BP (!) 153/92 (BP Location: Right Arm)   Pulse 83   Temp (!) 97.4 F (36.3 C) (Oral)   Resp 18   SpO2 94%  Physical Exam General: No acute distress Cardiac: Normal rate Lungs: No increased work of breathing Psych: Calm  ED Course / MDM  EKG:EKG Interpretation  Date/Time:  Friday October 01 2021 21:16:44 EDT Ventricular Rate:  100 PR Interval:  203 QRS Duration: 114 QT Interval:  357 QTC Calculation: 461 R Axis:   -20 Text Interpretation: Sinus tachycardia Borderline intraventricular conduction delay No significant change since last tracing Confirmed by Ripley Fraise [phone]) on 10/02/2021 2:13:56 AM  I have reviewed the labs performed to date as well as medications administered while in observation.  Recent changes in the last 24 hours include none.  Plan  Current plan is for awaiting placement.  Mario [name] is not under involuntary commitment.     Malvin Johns, MD 10/04/21 0900

## 2021-10-04 NOTE — Discharge Summary (Signed)
[facility] Psych ED Discharge  10/04/2021 11:17 AM Mario [name]  MRN:  [id number]  Principal Problem: Major depressive disorder, recurrent episode [facility]) Discharge Diagnoses: Principal Problem:   Major depressive disorder, recurrent episode ([place]) Active Problems:   Adjustment disorder with mixed disturbance of emotions and conduct  Clinical Impression:  Final diagnoses:  Shortness of breath  Suicidal ideation   Subjective:Mario [name] is a 71 y.o. male patient admitted with previous hx significant for Depression , anxiety and suicide ideation .came back to the ER after discharge yesterday.  This time patient plans to OD on his pills-Muscle relaxant. Patient was seen this morning for evaluation and discharge plan.  He fully participated in this evaluation and answered question promptly.  Patient denied feeling depressed and rated depression 2/10 with 10 being severe depression.  Patient also vehemently denied suicide ideation stating that he has received good care and that his only issue now is his Lung CA which he plans to follow up with the [facility] in [place] or [place].  Patient is alert and oriented x5.  He has ben using Oxygen 2 liter Nasal Cannular since his visit to the ER.  Patient tolerate oral fluid and drinks.  He did not have any need for PRN medications for agitation.  He only is concerned about the Cancer in his Lung stating his doctors need to do more to help him.  Patient reported that he has Oxygen at home both portable tank and concentrator at home.  Again patient vehemently denied feeling suicidal and denied HI/AVH and no mention of paranoia.  Patient is Psychiatrically cleared. ED Assessment Time Calculation: No data recorded  Past Psychiatric History: See initial Psychiatric evaluation note Past Medical History:  Past Medical History:  Diagnosis Date   Anxiety    Bronchitis    COPD (chronic obstructive pulmonary disease) ([place])    Depression    History of radiation therapy     Left lung- 10/05/20-10/15/20- Dr. Gery Pray   Hypertension    lung ca 09/2020   MI (myocardial infarction) [facility])    ????   OSA (obstructive sleep apnea)    Suicide attempt ([place])    Tension pneumothorax 06/27/2016   Uveitis     Past Surgical History:  Procedure Laterality Date   BIOPSY  07/03/2021   Procedure: BIOPSY;  Surgeon: Otis Brace, MD;  Location: [facility];  Service: Gastroenterology;;   BRONCHIAL BIOPSY  07/30/2020   Procedure: BRONCHIAL BIOPSIES;  Surgeon: Garner Nash, DO;  Location: [facility];  Service: Pulmonary;;   BRONCHIAL BRUSHINGS  07/30/2020   Procedure: BRONCHIAL BRUSHINGS;  Surgeon: Garner Nash, DO;  Location: [facility];  Service: Pulmonary;;   BRONCHIAL NEEDLE ASPIRATION BIOPSY  07/30/2020   Procedure: BRONCHIAL NEEDLE ASPIRATION BIOPSIES;  Surgeon: Garner Nash, DO;  Location: [place];  Service: Pulmonary;;   BRONCHIAL WASHINGS  07/30/2020   Procedure: BRONCHIAL WASHINGS;  Surgeon: Garner Nash, DO;  Location: [place];  Service: Pulmonary;;   CHEST TUBE INSERTION Left 06/27/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     ESOPHAGOGASTRODUODENOSCOPY N/A 07/03/2021   Procedure: ESOPHAGOGASTRODUODENOSCOPY (EGD);  Surgeon: Otis Brace, MD;  Location: [name];  Service: Gastroenterology;  Laterality: N/A;   IR PERC PLEURAL DRAIN W/INDWELL CATH W/IMG GUIDE  08/04/2021   IR REMOVAL OF PLURAL CATH W/CUFF  09/01/2021   SKIN CANCER EXCISION     VIDEO BRONCHOSCOPY WITH ENDOBRONCHIAL NAVIGATION Left 07/30/2020   Procedure: VIDEO BRONCHOSCOPY WITH ENDOBRONCHIAL NAVIGATION;  Surgeon: Garner Nash, DO;  Location: [facility];  Service: Pulmonary;  Laterality: Left;   Family History:  Family History  Problem Relation Age of Onset   Dementia Father    Family Psychiatric  History: see initial psychiatric evaluation Social History:  Social History   Substance and Sexual Activity  Alcohol Use No   Alcohol/week: 0.0 standard drinks of alcohol    Comment: denies use of any drugs or alcohol     Social History   Substance and Sexual Activity  Drug Use No    Social History   Socioeconomic History   Marital status: Single    Spouse name: Not on file   Number of children: Not on file   Years of education: Not on file   Highest education level: Not on file  Occupational History   Occupation: retired  Tobacco Use   Smoking status: Former    Packs/day: 1.00    Years: 35.00    Total pack years: 35.00    Types: Cigarettes    Quit date: 05/2016    Years since quitting: 5.3   Smokeless tobacco: Never  Vaping Use   Vaping Use: Never used  Substance and Sexual Activity   Alcohol use: No    Alcohol/week: 0.0 standard drinks of alcohol    Comment: denies use of any drugs or alcohol   Drug use: No   Sexual activity: Not on file  Other Topics Concern   Not on file  Social History Narrative   Not on file   Social Determinants of Health   Financial Resource Strain: Low Risk  (09/23/2020)   Overall Financial Resource Strain (CARDIA)    Difficulty of Paying Living Expenses: Not hard at all  Food Insecurity: Not on file  Transportation Needs: No Transportation Needs (09/23/2020)   PRAPARE - Hydrologist (Medical): No    Lack of Transportation (Non-Medical): No  Physical Activity: Not on file  Stress: No Stress Concern Present (09/23/2020)   [employer]    Feeling of Stress : Not at all  Social Connections: Not on file    Tobacco Cessation:  N/A, patient does not currently use tobacco products  Current Medications: Current Facility-Administered Medications  Medication Dose Route Frequency Provider Last Rate Last Admin   acetaminophen (TYLENOL) tablet 650 mg  650 mg Oral Q6H PRN Godfrey Pick, MD   650 mg at 10/03/21 0935   apixaban (ELIQUIS) tablet 5 mg  5 mg Oral BID Ripley Fraise, MD   5 mg at 10/04/21 0912   buPROPion  (WELLBUTRIN XL) 24 hr tablet 300 mg  300 mg Oral q morning Ripley Fraise, MD   300 mg at 10/04/21 0912   busPIRone (BUSPAR) tablet 15 mg  15 mg Oral Daily Ripley Fraise, MD   15 mg at 10/04/21 0913   cyclobenzaprine (FLEXERIL) tablet 30 mg  30 mg Oral QHS Ripley Fraise, MD   30 mg at 10/03/21 2230   diltiazem (CARDIZEM CD) 24 hr capsule 180 mg  180 mg Oral Daily Ripley Fraise, MD   180 mg at 10/04/21 0912   DULoxetine (CYMBALTA) DR capsule 60 mg  60 mg Oral BID Ripley Fraise, MD   60 mg at 10/04/21 0912   fentaNYL (DURAGESIC) 12 MCG/HR 1 patch  1 patch Transdermal Q72H Godfrey Pick, MD   1 patch at 10/03/21 1028   gabapentin (NEURONTIN) capsule 900 mg  900 mg Oral QHS Godfrey Pick, MD   900  mg at 10/03/21 2231   mycophenolate (CELLCEPT) capsule 1,500 mg  1,500 mg Oral BID Ripley Fraise, MD   1,500 mg at 10/04/21 0913   ondansetron (ZOFRAN-ODT) disintegrating tablet 4 mg  4 mg Oral Q8H PRN Oneal Deputy K, DO   4 mg at 10/03/21 2237   oxyCODONE (Oxy IR/ROXICODONE) immediate release tablet 5-10 mg  5-10 mg Oral Q4H PRN Ripley Fraise, MD   5 mg at 10/04/21 7591   Current Outpatient Medications  Medication Sig Dispense Refill   albuterol (PROVENTIL) (2.5 MG/3ML) 0.083% nebulizer solution Inhale 3 mLs (2.5 mg total) by nebulization every 4 (four) hours as needed for wheezing or shortness of breath. (Patient taking differently: Take 2.5 mg by nebulization every 6 (six) hours as needed for wheezing or shortness of breath.) 90 mL 2   apixaban (ELIQUIS) 5 MG TABS tablet Take 5 mg by mouth 2 (two) times daily.     b complex vitamins capsule Take 1 capsule by mouth every morning.     buPROPion (WELLBUTRIN XL) 300 MG 24 hr tablet Take 300 mg by mouth every morning.     busPIRone (BUSPAR) 15 MG tablet Take 15 mg by mouth daily.     calcium-vitamin D (OSCAL WITH D) 500-200 MG-UNIT tablet Take 1 tablet by mouth 2 (two) times daily with a meal.     clotrimazole-betamethasone (LOTRISONE)  cream Apply 1 application. topically 2 (two) times daily as needed (fungus).     cyclobenzaprine (FLEXERIL) 10 MG tablet Take 30 mg by mouth at bedtime.     diltiazem (CARDIZEM CD) 180 MG 24 hr capsule Take 180 mg by mouth daily.     docusate sodium (COLACE) 100 MG capsule Take 100 mg by mouth 2 (two) times daily.     feeding supplement (ENSURE ENLIVE / ENSURE PLUS) LIQD Take 237 mLs by mouth 3 (three) times daily between meals. (Patient taking differently: Take 237 mLs by mouth 2 (two) times daily.) 10000 mL 0   fentaNYL (DURAGESIC) 12 MCG/HR Place 1 patch onto the skin every 3 (three) days. 10 patch 0   fluticasone (FLONASE) 50 MCG/ACT nasal spray Place 2 sprays into both nostrils daily.      Mometasone Furoate 200 MCG/ACT AERO Inhale 2 puffs into the lungs at bedtime as needed (shortness of breath/wheezing).     mycophenolate (CELLCEPT) 250 MG capsule Take 1,500 mg by mouth 2 (two) times daily.     oxyCODONE (OXY IR/ROXICODONE) 5 MG immediate release tablet Take 1-2 tablets (5-10 mg total) by mouth every 4 (four) hours as needed for severe pain. 30 tablet 0   sodium chloride (OCEAN) 0.65 % SOLN nasal spray Place 2 sprays into both nostrils 4 (four) times daily as needed for congestion.     Tiotropium Bromide-Olodaterol 2.5-2.5 MCG/ACT AERS Inhale 2 each into the lungs every morning. 2 puffs     VITAMIN A PO Take 1 capsule by mouth daily.     acetaminophen (TYLENOL) 325 MG tablet Take 2 tablets (650 mg total) by mouth every 6 (six) hours as needed for up to 15 days for mild pain, moderate pain or headache. 45 tablet 0   DULoxetine (CYMBALTA) 60 MG capsule Take 1 capsule (60 mg total) by mouth 2 (two) times daily. 60 capsule 0   gabapentin (NEURONTIN) 300 MG capsule Take 3 capsules (900 mg total) by mouth at bedtime. 90 capsule 0   ondansetron (ZOFRAN-ODT) 4 MG disintegrating tablet Take 1 tablet (4 mg total) by mouth every 8 (  eight) hours as needed for nausea or vomiting. 20 tablet 0   OXYGEN  Inhale 2 L into the lungs continuous.     PTA Medications: (Not in a hospital admission)   [place]:  [place] ED from 10/01/2021 in [place] DEPT ED from 09/30/2021 in [place] DEPT ED from 09/29/2021 in [place] DEPT  C-SSRS RISK CATEGORY High Risk High Risk Error: Q3, 4, or 5 should not be populated when Q2 is No       Musculoskeletal: Strength & Muscle Tone: within normal limits and uses walker occasional at home Gait & Station: normal, utilizes walker at home Patient leans:  see above  Psychiatric Specialty Exam: Presentation  General Appearance: Casual; Fairly Groomed; Other (comment) (obese)  Eye Contact:Good  Speech:Clear and Coherent; Normal Rate  Speech Volume:Normal  Handedness:Right   Mood and Affect  Mood:Anxious; Depressed  Affect:Congruent; Depressed   Thought Process  Thought Processes:Coherent; Goal Directed; Linear  Descriptions of Associations:Intact  Orientation:Full (Time, Place and Person)  Thought Content:Logical  History of Schizophrenia/Schizoaffective disorder:No data recorded Duration of Psychotic Symptoms:No data recorded Hallucinations:Hallucinations: None  Ideas of Reference:None  Suicidal Thoughts:Suicidal Thoughts: No  Homicidal Thoughts:Homicidal Thoughts: No   Sensorium  Memory:Immediate Good; Recent Good; Remote Good  Judgment:Good  Insight:Good   Executive Functions  Concentration:Good  Attention Span:Good  [place] of Knowledge:Good  Language:Good   Psychomotor Activity  Psychomotor Activity:Psychomotor Activity: Normal   Assets  Assets:Communication Skills; Desire for Improvement; Housing; Social Support   Sleep  Sleep:Sleep: Good    Physical Exam: Physical Exam ROS Blood pressure (!) 153/92, pulse 83, temperature (!) 97.4 F (36.3 C), temperature source Oral, resp. rate 18,  SpO2 94 %. There is no height or weight on file to calculate BMI.   Demographic Factors:  Male, Age 69 or older, Caucasian, and Unemployed  Loss Factors: Decline in physical health  Historical Factors: Prior suicide attempts  Risk Reduction Factors:   Living with another person, especially a relative and Positive social support  Continued Clinical Symptoms:  Depression:   Insomnia Chronic Pain Previous Psychiatric Diagnoses and Treatments  Cognitive Features That Contribute To Risk:  None    Suicide Risk:  Mild:  Suicidal ideation of limited frequency, intensity, duration, and specificity.  There are no identifiable plans, no associated intent, mild dysphoria and related symptoms, good self-control (both objective and subjective assessment), few other risk factors, and identifiable protective factors, including available and accessible social support.  Patient denies owning any gun at home.    Plan Of Care/Follow-up recommendations:  Activity:  as tolerated Diet:  Regular/low salt  Medical Decision Making: Patient denies SI/HI/AVH today.  He rated Depression 2/10 with 10 being severe depression.  Patient also reported having his oxygen at home to use for Lung CA which he plans to get better care.  Patient reported upcoming appointment with his doctors at [facility]  Patient also plans to visit [facility] for Lung Cancer treated stating they are the place to go for better care.  Patient is discharge at this time.  Patient reported that he does not need medications today because hs has a lot at home.  Patient is Psychiatrically cleared.  Problem 1: Recurrent Major Depressive disorder without Psychotic features  Problem 2: Adjustment disorder with mixed disturbance of  emotion of conduct   Disposition: Discharge-Psychiatrically cleared.  Delfin Gant, NP-PMHNP-BC 10/04/2021, 11:17 AM

## 2021-10-04 NOTE — Discharge Instructions (Signed)
For your behavioral health needs you are advised to follow up with the [facility]:       [facility]      [place], [place] [address]      [phone]

## 2021-10-08 ENCOUNTER — Encounter (HOSPITAL_COMMUNITY): Payer: Self-pay

## 2021-10-08 ENCOUNTER — Emergency Department (HOSPITAL_COMMUNITY): Payer: No Typology Code available for payment source

## 2021-10-08 ENCOUNTER — Emergency Department (HOSPITAL_COMMUNITY)
Admission: EM | Admit: 2021-10-08 | Discharge: 2021-10-09 | Disposition: A | Discharge status: Home / Self Care | Payer: No Typology Code available for payment source | Attending: Emergency Medicine | Admitting: Emergency Medicine

## 2021-10-08 ENCOUNTER — Other Ambulatory Visit: Payer: Self-pay

## 2021-10-08 DIAGNOSIS — C3492 Malignant neoplasm of unspecified part of left bronchus or lung: Secondary | ICD-10-CM | POA: Diagnosis present

## 2021-10-08 DIAGNOSIS — R079 Chest pain, unspecified: Secondary | ICD-10-CM | POA: Insufficient documentation

## 2021-10-08 DIAGNOSIS — Z20822 Contact with and (suspected) exposure to covid-19: Secondary | ICD-10-CM | POA: Diagnosis not present

## 2021-10-08 DIAGNOSIS — J189 Pneumonia, unspecified organism: Secondary | ICD-10-CM

## 2021-10-08 DIAGNOSIS — R45851 Suicidal ideations: Secondary | ICD-10-CM | POA: Diagnosis not present

## 2021-10-08 DIAGNOSIS — Z85118 Personal history of other malignant neoplasm of bronchus and lung: Secondary | ICD-10-CM | POA: Insufficient documentation

## 2021-10-08 DIAGNOSIS — F339 Major depressive disorder, recurrent, unspecified: Secondary | ICD-10-CM | POA: Diagnosis not present

## 2021-10-08 DIAGNOSIS — F4321 Adjustment disorder with depressed mood: Secondary | ICD-10-CM

## 2021-10-08 DIAGNOSIS — J449 Chronic obstructive pulmonary disease, unspecified: Secondary | ICD-10-CM | POA: Diagnosis present

## 2021-10-08 DIAGNOSIS — Z7901 Long term (current) use of anticoagulants: Secondary | ICD-10-CM | POA: Diagnosis not present

## 2021-10-08 DIAGNOSIS — Z765 Malingerer [conscious simulation]: Secondary | ICD-10-CM

## 2021-10-08 LAB — RESP PANEL BY RT-PCR (FLU A&B, COVID) ARPGX2
Influenza A by PCR: NEGATIVE
Influenza B by PCR: NEGATIVE
SARS Coronavirus 2 by RT PCR: NEGATIVE

## 2021-10-08 LAB — CBC
HCT: 36.3 % — ABNORMAL LOW (ref 39.0–52.0)
Hemoglobin: 11.4 g/dL — ABNORMAL LOW (ref 13.0–17.0)
MCH: 26.7 pg (ref 26.0–34.0)
MCHC: 31.4 g/dL (ref 30.0–36.0)
MCV: 85 fL (ref 80.0–100.0)
Platelets: 334 10*3/uL (ref 150–400)
RBC: 4.27 MIL/uL (ref 4.22–5.81)
RDW: 16.1 % — ABNORMAL HIGH (ref 11.5–15.5)
WBC: 11.3 10*3/uL — ABNORMAL HIGH (ref 4.0–10.5)
nRBC: 0 % (ref 0.0–0.2)

## 2021-10-08 LAB — BASIC METABOLIC PANEL
Anion gap: 9 (ref 5–15)
BUN: 10 mg/dL (ref 8–23)
CO2: 26 mmol/L (ref 22–32)
Calcium: 8.9 mg/dL (ref 8.9–10.3)
Chloride: 104 mmol/L (ref 98–111)
Creatinine, Ser: 0.99 mg/dL (ref 0.61–1.24)
GFR, Estimated: >60 mL/min (ref >60)
Glucose, Bld: 144 mg/dL — ABNORMAL HIGH (ref 70–99)
Potassium: 4.8 mmol/L (ref 3.5–5.1)
Sodium: 139 mmol/L (ref 135–145)

## 2021-10-08 LAB — RAPID URINE DRUG SCREEN, HOSP PERFORMED
Amphetamines: NOT DETECTED
Barbiturates: NOT DETECTED
Benzodiazepines: NOT DETECTED
Cocaine: NOT DETECTED
Opiates: POSITIVE — AB
Tetrahydrocannabinol: NOT DETECTED

## 2021-10-08 LAB — TROPONIN I (HIGH SENSITIVITY)
Troponin I (High Sensitivity): 8 ng/L (ref <18)
Troponin I (High Sensitivity): 7 ng/L (ref <18)

## 2021-10-08 LAB — ACETAMINOPHEN LEVEL: Acetaminophen (Tylenol), Serum: <10 ug/mL — ABNORMAL LOW (ref 10–30)

## 2021-10-08 LAB — SALICYLATE LEVEL: Salicylate Lvl: <7 mg/dL — ABNORMAL LOW (ref 7.0–30.0)

## 2021-10-08 LAB — ETHANOL: Alcohol, Ethyl (B): <10 mg/dL (ref <10)

## 2021-10-08 MED ORDER — ARFORMOTEROL TARTRATE 15 MCG/2ML IN NEBU
15.0000 ug | INHALATION_SOLUTION | Freq: Two times a day (BID) | RESPIRATORY_TRACT | Status: DC
  Administered 2021-10-08 – 2021-10-09 (×2): 15 ug via RESPIRATORY_TRACT
  Filled 2021-10-08 (×3): qty 2

## 2021-10-08 MED ORDER — OYSTER SHELL CALCIUM/D3 500-5 MG-MCG PO TABS
1.0000 | ORAL_TABLET | Freq: Two times a day (BID) | ORAL | Status: DC
  Filled 2021-10-08 (×2): qty 1

## 2021-10-08 MED ORDER — UMECLIDINIUM BROMIDE 62.5 MCG/ACT IN AEPB: 1.0000 | INHALATION_SPRAY | Freq: Every day | RESPIRATORY_TRACT | Status: DC

## 2021-10-08 MED ORDER — MOMETASONE FUROATE 200 MCG/ACT IN AERO: 2.0000 | INHALATION_SPRAY | Freq: Every evening | RESPIRATORY_TRACT | Status: DC | PRN

## 2021-10-08 MED ORDER — BUDESONIDE 0.5 MG/2ML IN SUSP
0.5000 mg | Freq: Two times a day (BID) | RESPIRATORY_TRACT | Status: DC | PRN
Start: 2021-10-08 — End: 2021-10-09

## 2021-10-08 MED ORDER — DOCUSATE SODIUM 100 MG PO CAPS
100.0000 mg | ORAL_CAPSULE | Freq: Two times a day (BID) | ORAL | Status: DC
  Administered 2021-10-08 – 2021-10-09 (×2): 100 mg via ORAL
  Filled 2021-10-08 (×2): qty 1

## 2021-10-08 MED ORDER — BUPROPION HCL ER (XL) 300 MG PO TB24: 300.0000 mg | ORAL_TABLET | Freq: Every morning | ORAL | Status: DC

## 2021-10-08 MED ORDER — OXYCODONE HCL 5 MG PO TABS
5.0000 mg | ORAL_TABLET | ORAL | Status: DC | PRN
  Administered 2021-10-08 – 2021-10-09 (×2): 10 mg via ORAL
  Administered 2021-10-09: 5 mg via ORAL
  Filled 2021-10-08: qty 2
  Filled 2021-10-08: qty 1
  Filled 2021-10-08: qty 2

## 2021-10-08 MED ORDER — ALBUTEROL SULFATE (2.5 MG/3ML) 0.083% IN NEBU
2.5000 mg | INHALATION_SOLUTION | RESPIRATORY_TRACT | Status: DC | PRN
Start: 2021-10-08 — End: 2021-10-09

## 2021-10-08 MED ORDER — FENTANYL 12 MCG/HR TD PT72
1.0000 | MEDICATED_PATCH | TRANSDERMAL | Status: DC
  Administered 2021-10-08: 1 via TRANSDERMAL
  Filled 2021-10-08: qty 1

## 2021-10-08 MED ORDER — CALCIUM CARBONATE-VITAMIN D 500-200 MG-UNIT PO TABS: 1.0000 | ORAL_TABLET | Freq: Two times a day (BID) | ORAL | Status: DC

## 2021-10-08 MED ORDER — FENTANYL 12 MCG/HR TD PT72
1.0000 | MEDICATED_PATCH | TRANSDERMAL | Status: DC
Start: 2021-10-08 — End: 2021-10-08

## 2021-10-08 MED ORDER — DULOXETINE HCL 60 MG PO CPEP
60.0000 mg | ORAL_CAPSULE | Freq: Two times a day (BID) | ORAL | Status: DC
  Administered 2021-10-08 – 2021-10-09 (×2): 60 mg via ORAL
  Filled 2021-10-08 (×2): qty 1

## 2021-10-08 MED ORDER — ONDANSETRON 4 MG PO TBDP
4.0000 mg | ORAL_TABLET | Freq: Three times a day (TID) | ORAL | Status: DC | PRN
  Administered 2021-10-08: 4 mg via ORAL
  Filled 2021-10-08 (×2): qty 1

## 2021-10-08 MED ORDER — ARFORMOTEROL TARTRATE 15 MCG/2ML IN NEBU: 15.0000 ug | INHALATION_SOLUTION | Freq: Two times a day (BID) | RESPIRATORY_TRACT | Status: DC

## 2021-10-08 MED ORDER — APIXABAN 5 MG PO TABS: 5.0000 mg | ORAL_TABLET | Freq: Two times a day (BID) | ORAL | Status: DC

## 2021-10-08 MED ORDER — DILTIAZEM HCL ER COATED BEADS 180 MG PO CP24
180.0000 mg | ORAL_CAPSULE | Freq: Every day | ORAL | Status: DC
  Administered 2021-10-09: 180 mg via ORAL
  Filled 2021-10-08 (×2): qty 1

## 2021-10-08 MED ORDER — DOXYCYCLINE HYCLATE 100 MG PO TABS
100.0000 mg | ORAL_TABLET | Freq: Two times a day (BID) | ORAL | Status: DC
  Administered 2021-10-08 – 2021-10-09 (×2): 100 mg via ORAL
  Filled 2021-10-08 (×2): qty 1

## 2021-10-08 MED ORDER — BUSPIRONE HCL 10 MG PO TABS
15.0000 mg | ORAL_TABLET | Freq: Every day | ORAL | Status: DC
Start: 2021-10-08 — End: 2021-10-09
  Administered 2021-10-08 – 2021-10-09 (×2): 15 mg via ORAL
  Filled 2021-10-08 (×2): qty 2

## 2021-10-08 MED ORDER — BUPROPION HCL ER (XL) 300 MG PO TB24
300.0000 mg | ORAL_TABLET | Freq: Every morning | ORAL | Status: DC
  Filled 2021-10-08: qty 1

## 2021-10-08 MED ORDER — UMECLIDINIUM BROMIDE 62.5 MCG/ACT IN AEPB
1.0000 | INHALATION_SPRAY | Freq: Every day | RESPIRATORY_TRACT | Status: DC
  Filled 2021-10-08: qty 7

## 2021-10-08 MED ORDER — MORPHINE SULFATE (PF) 4 MG/ML IV SOLN
4.0000 mg | Freq: Once | INTRAVENOUS | Status: AC
  Administered 2021-10-08: 4 mg via INTRAMUSCULAR
  Filled 2021-10-08: qty 1

## 2021-10-08 MED ORDER — MORPHINE SULFATE (PF) 4 MG/ML IV SOLN
4.0000 mg | Freq: Once | INTRAVENOUS | Status: DC
  Filled 2021-10-08: qty 1

## 2021-10-08 MED ORDER — APIXABAN 5 MG PO TABS
5.0000 mg | ORAL_TABLET | Freq: Two times a day (BID) | ORAL | Status: DC
Start: 2021-10-08 — End: 2021-10-08

## 2021-10-08 MED ORDER — APIXABAN 5 MG PO TABS
5.0000 mg | ORAL_TABLET | Freq: Two times a day (BID) | ORAL | Status: DC
Start: 2021-10-08 — End: 2021-10-09
  Administered 2021-10-08 – 2021-10-09 (×2): 5 mg via ORAL
  Filled 2021-10-08 (×2): qty 1

## 2021-10-08 MED ORDER — ACETAMINOPHEN 325 MG PO TABS
650.0000 mg | ORAL_TABLET | Freq: Four times a day (QID) | ORAL | Status: DC | PRN
  Administered 2021-10-08: 650 mg via ORAL
  Filled 2021-10-08: qty 2

## 2021-10-08 MED ORDER — GABAPENTIN 300 MG PO CAPS
900.0000 mg | ORAL_CAPSULE | Freq: Every day | ORAL | Status: DC
  Administered 2021-10-08: 900 mg via ORAL
  Filled 2021-10-08: qty 3

## 2021-10-08 MED ORDER — ONDANSETRON 4 MG PO TBDP
4.0000 mg | ORAL_TABLET | Freq: Once | ORAL | Status: AC
  Administered 2021-10-08: 4 mg via ORAL
  Filled 2021-10-08: qty 1

## 2021-10-08 MED ORDER — FENTANYL 12 MCG/HR TD PT72: 1.0000 | MEDICATED_PATCH | TRANSDERMAL | Status: DC

## 2021-10-08 NOTE — ED Provider Triage Note (Signed)
Emergency Medicine Provider Triage Evaluation Note  Mario [name] , a 71 y.o. male  was evaluated in triage.  Pt complains of chest pain and suicidality.  Patient reports chest pain for the last 2 hours, nonradiating, not associated with shortness of breath beyond his baseline.  Patient denies any leg swelling, back pain.  Patient states chest pain feels as if his pressure.  Patient was recently seen at [facility] for same complaint, discharged with unremarkable work-up.  Patient also complaining of suicidality, states he plans on overdosing on muscle relaxers.  Patient states he has felt this way for the last few days.  Patient reports that he does not have access to firearms, has been admitted to psychiatric and before.  The patient was seen for the same issue at [facility] [facility] long 2 days ago and discharged.  Review of Systems  Positive:  Negative:   Physical Exam  BP 136/66   Pulse (!) 115   Temp 99.7 F (37.6 C) (Oral)   Resp 18   Ht 6\' 2"  (1.88 m)   Wt 135.2 kg   SpO2 92%   BMI 38.26 kg/m  Gen:   Awake, no distress   Resp:  Normal effort  MSK:   Moves extremities without difficulty  Other:    Medical Decision Making  Medically screening exam initiated at 1:27 PM.  Appropriate orders placed.  [name] was informed that the remainder of the evaluation will be completed by another provider, this initial triage assessment does not replace that evaluation, and the importance of remaining in the ED until their evaluation is complete.     Azucena Cecil, PA-C 10/08/21 1329

## 2021-10-08 NOTE — ED Triage Notes (Signed)
Patient has been seen at [facility] numerous times for same complaints CP and SI.  Patient doesn't like where he is living and doesn't like leaving the hospital. Central chest pain tachy 112-120 received ASA and nitro pain went from 6 to 3

## 2021-10-08 NOTE — ED Notes (Signed)
Pt unable to collect urine at

## 2021-10-08 NOTE — ED Notes (Signed)
Pt does not fit in scrubs available. Security checks patient. Wands patient. Belongings gathered and placed in bags. Meal given. Pt only complains of headache at this time. Cooperative. Calm.

## 2021-10-08 NOTE — ED Provider Notes (Signed)
[facility] EMERGENCY DEPARTMENT Provider Note   CSN: [id number] Arrival date & time: 10/08/21  1309     History  Chief Complaint  Patient presents with   Chest Pain   Suicidal    Mario [name] is a 71 y.o. male.   Chest Pain    Patient has a history of lung cancer, chest pain and depression.  Patient also has history of COPD and MI.  Patient states he was diagnosed with lung cancer previously.  Patient states he initially was treated with radiation and the plan was for 69-month follow-up.  In the interim, patient states his cancer worsened and he attributes it to the care that he received previously.  Patient states he does not want to see the cancer center in [place] any longer and wants to go to the [place].  Patient has been seen in the hospital multiple times because of persistent symptoms associated with this.  Patient also has been having issues with depression and he states suicidal ideation.  However patient states he does not want to die and wants to fight the cancer.  Home Medications Prior to Admission medications   Medication Sig Start Date End Date Taking? Authorizing Provider  acetaminophen (TYLENOL) 325 MG tablet Take 2 tablets (650 mg total) by mouth every 6 (six) hours as needed for up to 15 days for mild pain, moderate pain or headache. 10/04/21 10/19/21  Delfin Gant, NP  albuterol (PROVENTIL) (2.5 MG/3ML) 0.083% nebulizer solution Inhale 3 mLs (2.5 mg total) by nebulization every 4 (four) hours as needed for wheezing or shortness of breath. Patient taking differently: Take 2.5 mg by nebulization every 6 (six) hours as needed for wheezing or shortness of breath. 08/04/20 10/02/22  Lavina Hamman, MD  apixaban (ELIQUIS) 5 MG TABS tablet Take 5 mg by mouth 2 (two) times daily.    [provider]  b complex vitamins capsule Take 1 capsule by mouth every morning.    [provider]  buPROPion (WELLBUTRIN XL) 300 MG 24 hr tablet Take  300 mg by mouth every morning. 02/05/20   [provider]  busPIRone (BUSPAR) 15 MG tablet Take 15 mg by mouth daily.    [provider]  calcium-vitamin D (OSCAL WITH D) 500-200 MG-UNIT tablet Take 1 tablet by mouth 2 (two) times daily with a meal.    [provider]  clotrimazole-betamethasone (LOTRISONE) cream Apply 1 application. topically 2 (two) times daily as needed (fungus). 07/06/21   Aline August, MD  cyclobenzaprine (FLEXERIL) 10 MG tablet Take 30 mg by mouth at bedtime.    [provider]  diltiazem (CARDIZEM CD) 180 MG 24 hr capsule Take 180 mg by mouth daily.    [provider]  docusate sodium (COLACE) 100 MG capsule Take 100 mg by mouth 2 (two) times daily.    [provider]  DULoxetine (CYMBALTA) 60 MG capsule Take 1 capsule (60 mg total) by mouth 2 (two) times daily. 10/04/21 11/03/21  Delfin Gant, NP  feeding supplement (ENSURE ENLIVE / ENSURE PLUS) LIQD Take 237 mLs by mouth 3 (three) times daily between meals. Patient taking differently: Take 237 mLs by mouth 2 (two) times daily. 08/04/20   Lavina Hamman, MD  fentaNYL (DURAGESIC) 12 MCG/HR Place 1 patch onto the skin every 3 (three) days. 09/28/21 10/28/21  Terrilee Croak, MD  fluticasone (FLONASE) 50 MCG/ACT nasal spray Place 2 sprays into both nostrils daily.     [provider]  gabapentin (NEURONTIN) 300 MG capsule Take 3 capsules (900 mg total) by mouth at bedtime. 10/04/21 11/03/21  Delfin Gant, NP  Mometasone Furoate 200 MCG/ACT AERO Inhale 2 puffs into the lungs at bedtime as needed (shortness of breath/wheezing).    [provider]  mycophenolate (CELLCEPT) 250 MG capsule Take 1,500 mg by mouth 2 (two) times daily.    [provider]  ondansetron (ZOFRAN-ODT) 4 MG disintegrating tablet Take 1 tablet (4 mg total) by mouth every 8 (eight) hours as needed for nausea or vomiting. 10/04/21   Delfin Gant, NP  oxyCODONE (OXY  IR/ROXICODONE) 5 MG immediate release tablet Take 1-2 tablets (5-10 mg total) by mouth every 4 (four) hours as needed for severe pain. 9/70/26   Delora Fuel, MD  OXYGEN Inhale 2 L into the lungs continuous.    [provider]  sodium chloride (OCEAN) 0.65 % SOLN nasal spray Place 2 sprays into both nostrils 4 (four) times daily as needed for congestion.    [provider]  Tiotropium Bromide-Olodaterol 2.5-2.5 MCG/ACT AERS Inhale 2 each into the lungs every morning. 2 puffs    [provider]  VITAMIN A PO Take 1 capsule by mouth daily.    [provider]      Allergies    Demerol [meperidine], Zocor [simvastatin], Beet [beta vulgaris], and Liver    Review of Systems   Review of Systems  Cardiovascular:  Positive for chest pain.    Physical Exam Updated Vital Signs BP 136/66   Pulse (!) 115   Temp 99.7 F (37.6 C) (Oral)   Resp 18   Ht 1.88 m (6\' 2" )   Wt 135.2 kg   SpO2 92%   BMI 38.26 kg/m  Physical Exam  ED Results / Procedures / Treatments   Labs (all labs ordered are listed, but only abnormal results are displayed) Labs Reviewed  BASIC METABOLIC PANEL - Abnormal; Notable for the following components:      Result Value   Glucose, Bld 144 (*)    All other components within normal limits  ACETAMINOPHEN LEVEL - Abnormal; Notable for the following components:   Acetaminophen (Tylenol), Serum <10 (*)    All other components within normal limits  SALICYLATE LEVEL - Abnormal; Notable for the following components:   Salicylate Lvl <3.7 (*)    All other components within normal limits  RAPID URINE DRUG SCREEN, HOSP PERFORMED - Abnormal; Notable for the following components:   Opiates POSITIVE (*)    All other components within normal limits  CBC - Abnormal; Notable for the following components:   WBC 11.3 (*)    Hemoglobin 11.4 (*)    HCT 36.3 (*)    RDW 16.1 (*)    All other components within normal limits  RESP PANEL BY RT-PCR (FLU  A&B, COVID) ARPGX2  ETHANOL  TROPONIN I (HIGH SENSITIVITY)  TROPONIN I (HIGH SENSITIVITY)    EKG None  Radiology DG Chest 1 View  Result Date: 10/08/2021 CLINICAL DATA:  Chest pain and abdominal pain today. History of lung carcinoma. EXAM: CHEST  1 VIEW COMPARISON:  09/30/2021 and older studies.  CT, 09/30/2021. FINDINGS: Cardiac silhouette normal in size.  No mediastinal or hilar masses. Opacity at the left lung base obscures the left hemidiaphragm and portions of the left heart border. Linear opacities noted at the right lung base consistent with scarring or atelectasis. There prominent interstitial markings most evident in the lower lungs. Relative lucency in the  upper lobes is consistent with emphysema. Possible small left pleural effusion. No evidence of a right pleural effusion. No pneumothorax. IMPRESSION: 1. Persistent opacity at the left lung base consistent with a combination atelectasis and/or pneumonia and a small effusion. Findings are stable from the prior chest radiograph and chest CT. Electronically Signed   By: Lajean Manes M.D.   On: 10/08/2021 13:59    Procedures Procedures    Medications Ordered in ED Medications  acetaminophen (TYLENOL) tablet 650 mg (has no administration in time range)  albuterol (PROVENTIL) (2.5 MG/3ML) 0.083% nebulizer solution 2.5 mg (has no administration in time range)  apixaban (ELIQUIS) tablet 5 mg (has no administration in time range)  buPROPion (WELLBUTRIN XL) 24 hr tablet 300 mg (has no administration in time range)  busPIRone (BUSPAR) tablet 15 mg (has no administration in time range)  calcium-vitamin D (OSCAL WITH D) 500-200 MG-UNIT per tablet 1 tablet (has no administration in time range)  diltiazem (CARDIZEM CD) 24 hr capsule 180 mg (has no administration in time range)  docusate sodium (COLACE) capsule 100 mg (has no administration in time range)  DULoxetine (CYMBALTA) DR capsule 60 mg (has no administration in time range)  fentaNYL  (DURAGESIC) 12 MCG/HR 1 patch (has no administration in time range)  gabapentin (NEURONTIN) capsule 900 mg (has no administration in time range)  Mometasone Furoate AERO 2 puff (has no administration in time range)  oxyCODONE (Oxy IR/ROXICODONE) immediate release tablet 5-10 mg (has no administration in time range)  ondansetron (ZOFRAN-ODT) disintegrating tablet 4 mg (has no administration in time range)  arformoterol (BROVANA) nebulizer solution 15 mcg (has no administration in time range)    And  umeclidinium bromide (INCRUSE ELLIPTA) 62.5 MCG/ACT 1 puff (has no administration in time range)  doxycycline (VIBRA-TABS) tablet 100 mg (has no administration in time range)  ondansetron (ZOFRAN-ODT) disintegrating tablet 4 mg (4 mg Oral Given 10/08/21 1340)  morphine (PF) 4 MG/ML injection 4 mg (4 mg Intramuscular Given 10/08/21 1815)    ED Course/ Medical Decision Making/ A&P Clinical Course as of 10/08/21 1928  Fri Oct 08, 2021  8502 Basic metabolic panel(!) nl [JK]  7741 Salicylate level(!) Nl  [JK]  1624 Ethanol Nl  [JK]  1624 Troponin I (High Sensitivity) Nl  [JK]  1624 CBC(!) Anemia is stable [JK]  1625 Troponin I (High Sensitivity) Normal [JK]  1625 DG Chest 1 View Chest x-ray shows persistent atelectasis and/or pneumonia [JK]  1625 , unchanged [JK]  1625 Reviewed CT angiogram performed on August 3.  No PE noted.  Residual fluid noted that correlates with x-ray today [JK]  1820 Pt seen by psychiatry.  Plan is for observation [JK]  1925 Serial troponins unchanged.  Patient medically cleared [JK]    Clinical Course User Index [JK] Dorie Rank, MD                           Medical Decision Making Amount and/or Complexity of Data Reviewed Labs:  Decision-making details documented in ED Course. Radiology:  Decision-making details documented in ED Course.  Risk OTC drugs. Prescription drug management.  Previous records reviewed.  Patient was just discharged from the  hospital after being evaluated by the behavioral health team.  He was discharged on August 7.  Patient has been having persistent chest pain.  Has known history of lung cancer and also had a chest tube on the left side.  He continues to have some fluid.  Recent  x-rays suggest the possibility of pneumonia versus atelectasis.  I will start the patient on a course of oral antibiotics although clinically he is not having fever and significant cause.  I suspect this pain is residual from that area of irritation and inflammation.  Patient complains of suicidal ideation however at the same time he also states he does not want to die.  Psychiatry was consulted by triage and they recommend overnight observation.        Final Clinical Impression(s) / ED Diagnoses Final diagnoses:  Chest pain, unspecified type  Suicidal ideation    Rx / DC Orders ED Discharge Orders     None         Dorie Rank, MD 10/08/21 1928

## 2021-10-08 NOTE — BH Assessment (Signed)
Comprehensive Clinical Assessment (CCA) Screening, Triage and Referral Note  10/08/2021 Mario [name] [id number]  Disposition: Per [facility] [name], pt recommended for 24 hour observation.   [name] ED from 10/08/2021 in [name] ED from 10/01/2021 in [name] DEPT ED from 09/30/2021 in [name] DEPT  C-SSRS RISK CATEGORY High Risk High Risk High Risk      The patient demonstrates the following risk factors for suicide: Chronic risk factors for suicide include: psychiatric disorder of depression, previous suicide attempts about 25 years ago, and medical illness cancer . Acute risk factors for suicide include: N/A. Protective factors for this patient include: positive therapeutic relationship and responsibility to others (children, family). Considering these factors, the overall suicide risk at this point appears to be high. Patient is not appropriate for outpatient follow up.  Mario [name] is a 71 year old male presenting to [facility] voluntarily with chief complaint of chest pain, stomach pain and suicidal ideations with a plan to overdose on muscle relaxer. Patient reports suicidal ideations for about a week due to his medical issues. Patient has complaints about the doctor who treated his cancer about a year ago and reports that the doctor wants him to go into Hospice. Patient reports "I want to fight this cancer". Patient reports he does not really want to die but at times he feels like giving up. Patient reports he has not taken his psychotropic medications in about a week due to them being packed in boxes from his recent move. Patient receives medication management through the [place] but denies having outpatient therapy. Patient lives alone in a boarding room and denies having a firearm.   Patient is oriented, alert and engaged. Patient affect and mood are appropriate. Patient reports SI with plan and  unable to contract for safety. Patient denies HI, AVH substance use. Patient does not feel safe to return home today.     Chief Complaint:  Chief Complaint  Patient presents with   Chest Pain   Suicidal   Visit Diagnosis: Suicidal Ideations  Patient Reported Information How did you hear about [place]? Self  What Is the Reason for Your Visit/Call Today? Patient has a history of lung cancer, chest pain and depression.  Patient also has history of COPD and MI.  Patient states he was diagnosed with lung cancer previously.  Patient states he initially was treated with radiation and the plan was for 65-month follow-up.  In the interim, patient states his cancer worsened and he attributes it to the care that he received previously.  Patient states he does not want to see the cancer [name] in [place] any longer and wants to go to the [place].  Patient has been seen in the hospital multiple times because of persistent symptoms associated with this.  Patient also has been having issues with depression and he states suicidal ideation.  However patient states he does not want to die and wants to fight the cancer.  How Long Has This Been Causing You Problems? 1-6 months  What Do You Feel Would Help You the Most Today? Treatment for Depression or other mood problem   Have You Recently Had Any Thoughts About Hurting Yourself? Yes  Are You Planning to Commit Suicide/Harm Yourself At This time? Yes   Have you Recently Had Thoughts About Hurting Someone Mario [name]? No  Are You Planning to Harm Someone at This Time? No  Explanation: No data recorded  Have You Used Any Alcohol  or Drugs in the Past 24 Hours? No  How Long Ago Did You Use Drugs or Alcohol? No data recorded What Did You Use and How Much? No data recorded  Do You Currently Have a Therapist/Psychiatrist? Yes  [facility]   Have You Been Recently Discharged From Any Office Practice or Programs? No  Explanation of Discharge  From Practice/Program: No data recorded   CCA Screening Triage Referral Assessment Type of Contact: Tele-Assessment  Telemedicine Service Delivery: Telemedicine service delivery: This service was provided via telemedicine using a 2-way, interactive audio and video technology  Is this Initial or Reassessment? Initial Assessment  Date Telepsych consult ordered in [facility]:  10/08/21  Time Telepsych consult ordered in [name]:  1933  Location of Assessment: [facility] ED  Provider Location: [name] Assessment Services   Collateral Involvement: none   Does Patient Have a Kennedy? No data recorded Name and Contact of Legal Guardian: No data recorded If Minor and Not Living with Parent(s), Who has Custody? No data recorded Is CPS involved or ever been involved? Never  Is APS involved or ever been involved? Never   Patient Determined To Be At Risk for Harm To Self or Others Based on Review of Patient Reported Information or Presenting Complaint? Yes, for Self-Harm  Method: No data recorded Availability of Means: No data recorded Intent: No data recorded Notification Required: No data recorded Additional Information for Danger to Others Potential: No data recorded Additional Comments for Danger to Others Potential: No data recorded Are There Guns or Other Weapons in Your Home? No data recorded Types of Guns/Weapons: No data recorded Are These Weapons Safely Secured?                            No data recorded Who Could Verify You Are Able To Have These Secured: No data recorded Do You Have any Outstanding Charges, Pending Court Dates, Parole/Probation? No data recorded Contacted To Inform of Risk of Harm To Self or Others: No data recorded  Does Patient Present under Involuntary Commitment? No  IVC Papers Initial File Date: No data recorded  [place] of Residence: [place]   Patient Currently Receiving the Following Services: Medication Management   Determination of  Need: Urgent (48 hours)   Options For Referral: Outpatient Therapy; Medication Management   Discharge Disposition:     Mario [name], Mario [name]

## 2021-10-09 DIAGNOSIS — F4321 Adjustment disorder with depressed mood: Secondary | ICD-10-CM

## 2021-10-09 MED ORDER — DOXYCYCLINE HYCLATE 100 MG PO CAPS: 100.0000 mg | ORAL_CAPSULE | Freq: Two times a day (BID) | ORAL | 0 refills | Status: AC

## 2021-10-09 MED ORDER — DOXYCYCLINE HYCLATE 100 MG PO CAPS: 100.0000 mg | ORAL_CAPSULE | Freq: Two times a day (BID) | ORAL | 0 refills | Status: DC

## 2021-10-09 NOTE — Discharge Instructions (Addendum)
Please Follow-up with [facility] Address: [address] [place], [place] [address] Phone: [phone] ext: [phone]   The emergency team earlier felt that you  ikely have an early respiratory infection or pneumonia.  Please complete the antibiotics to treat.  If any symptoms change or worsen acutely, please consider shortness of breath.

## 2021-10-09 NOTE — ED Notes (Signed)
Breakfast order placed ?

## 2021-10-09 NOTE — ED Notes (Signed)
Patient asked RN for pain medication when RN went in to administer RN had to wake patient up after standing in room for a minute; When asked what was hurting patient states my neck hurt at 7/10; RN administered pain medication based on pain score; pt was not happy with what was given; Patient states his pain med is to be given every 4 hours; RN advised patient of parameters of PRN order and pain scale; Patient then states "that's bull$hit!" "I need I!" Patient is also wearing a Fentanyl patch at this time and has been given multiple rounds of pain medication; Patient then accuse RN of not giving him his night time medications; RN was unsure if patient was confused or having a memory laps; RN pulled up chart to show patient meds that was given to him at bedtime; Patient states to RN "I just give up." Patient then states I never got my flexeril, or Trazodone. Patient advised that those medications are no in his chart to administer.- [facility]

## 2021-10-09 NOTE — ED Notes (Signed)
Belongings given back to pt and pt signed for them. Awaiting PTAR. Per Network engineer, pt is next in line

## 2021-10-09 NOTE — ED Notes (Signed)
This nurse notified EDP of need for clearance for D/C. Medical clearance and D/C orders obtained from Dr. Sherry Ruffing. Pt states he needs PTAR for transport home to [address] [place],  [address]

## 2021-10-09 NOTE — Progress Notes (Signed)
CSW provided the following resources   [facility] Address: [address] [place], [place] [address] Phone: [phone] ext: [place], MSW, Laurence Compton Phone: [phone] Disposition/TOC

## 2021-10-09 NOTE — ED Provider Notes (Signed)
Patient was medically cleared by Dr. Sherry Ruffing.  He was then discharged home prior to my arrival and evaluation.   Fransico Meadow, MD 10/09/21 1144

## 2021-10-09 NOTE — ED Notes (Signed)
Notified Network engineer of need for [employer]

## 2021-10-09 NOTE — Consult Note (Signed)
Telepsych Consultation   Reason for Consult:  Psychiatric Reassessment Referring Physician:  Lawana Chambers Location of Patient:    [name] Location of Provider: Other: Virtual home office  Patient Identification: Mario [name] MRN:  [id number] Principal Diagnosis: Adjustment disorder with depressed mood Diagnosis:  Principal Problem:   Adjustment disorder with depressed mood Active Problems:   Malingering   Major depressive disorder, recurrent episode ([place])   COPD (chronic obstructive pulmonary disease) ([place])   Total Time spent with patient: 30 minutes  Subjective:   Mario [name] is a 71 y.o. male patient admitted with suicidal ideations after being off his mental health medications.  He was recommended for 23 hours observation to get restarted on his psychiatric medications and AM psych reassessment.   HPI:   Patient seen via telepsych by this provider; chart reviewed and consulted with Dr. Dwyane Dee on 10/09/21.  On evaluation [name] reports he no longer has suicidal ideations, no plan or intent for self harm.  States he feels better today after resting and being restarted on Duloxetine 60mg  po BID; Bupropion 300mg  XL 24hr tab daily; Buspar 15mg  daily.   Patient has been evaluated 2x within the past week in the emergency room with similar presentation.  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 Per record review this writer was able to locate [place] records dated 10/06/21 in which patient was being referred to [facility] for oncology care.  Pt does endorse a significant history of nicotine use stopped smoking a few years prior. He has a hx of COPD, of emphysema and reports a hx SOB with exertion. NAD during assessment.   On exam he is alert and oriented x4; Pt is beard and mustache are neatly groomed, he's wearing hospital gown and his jeans are seen  underneath.  He appears cared for and does not appear disheveled. Pt is clear and coherent and spontaneously engages in conversation.  He does not appear delusional, denies audible or visual hallucinations and no concerns he's responding to internal stimulus.    Patient reports he recently rented a room in a home with other boarders.  He states his roommate is a Theme park manager and he feels comfortable talking with him.  He states he has a daughter who lives in [place] but he talks with her often via telephone.  He denies prior history of suicide attempts, prior self injurious behaviors and does not own a firearm--denies access to lethal means.    Pt reports he's empanelled at [facility] and is followed by Dr. Otelia Sergeant for primary care, last visit historically a few weeks ago via telephone;  He sees psychiatry there are as well but cannot recall the name of his psychiatric provider.  States he has above psychiatric medications at home but did not want to look for them prior to coming to the emergency department.    EKG WNL to continue psych meds, no prolonged QT intervals.  LFTs completed 09/29/2021 also WNL.  Patient has been medically cleared prior to psych assessment.    Past Psychiatric History: as outlined above  Risk to Self:  no Risk to Others:  no Prior Inpatient Therapy: denies  Prior Outpatient Therapy:  yes, [facility]  Past Medical History:  Past Medical History:  Diagnosis Date   Anxiety    Bronchitis  COPD (chronic obstructive pulmonary disease) (HCC)    Depression    History of radiation therapy    Left lung- 10/05/20-10/15/20- Dr. Gery Pray   Hypertension    lung ca 09/2020   MI (myocardial infarction) [facility])    ????   OSA (obstructive sleep apnea)    Suicide attempt ([place])    Tension pneumothorax 06/27/2016   Uveitis     Past Surgical History:  Procedure Laterality Date   BIOPSY  07/03/2021   Procedure: BIOPSY;  Surgeon:  Otis Brace, MD;  Location: [facility] ENDOSCOPY;  Service: Gastroenterology;;   BRONCHIAL BIOPSY  07/30/2020   Procedure: BRONCHIAL BIOPSIES;  Surgeon: Garner Nash, DO;  Location: [place] ENDOSCOPY;  Service: Pulmonary;;   BRONCHIAL BRUSHINGS  07/30/2020   Procedure: BRONCHIAL BRUSHINGS;  Surgeon: Garner Nash, DO;  Location: [place] ENDOSCOPY;  Service: Pulmonary;;   BRONCHIAL NEEDLE ASPIRATION BIOPSY  07/30/2020   Procedure: BRONCHIAL NEEDLE ASPIRATION BIOPSIES;  Surgeon: Garner Nash, DO;  Location: [place];  Service: Pulmonary;;   BRONCHIAL WASHINGS  07/30/2020   Procedure: BRONCHIAL WASHINGS;  Surgeon: Garner Nash, DO;  Location: [place];  Service: Pulmonary;;   CHEST TUBE INSERTION Left 06/27/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     ESOPHAGOGASTRODUODENOSCOPY N/A 07/03/2021   Procedure: ESOPHAGOGASTRODUODENOSCOPY (EGD);  Surgeon: Otis Brace, MD;  Location: [name];  Service: Gastroenterology;  Laterality: N/A;   IR PERC PLEURAL DRAIN W/INDWELL CATH W/IMG GUIDE  08/04/2021   IR REMOVAL OF PLURAL CATH W/CUFF  09/01/2021   SKIN CANCER EXCISION     VIDEO BRONCHOSCOPY WITH ENDOBRONCHIAL NAVIGATION Left 07/30/2020   Procedure: VIDEO BRONCHOSCOPY WITH ENDOBRONCHIAL NAVIGATION;  Surgeon: Garner Nash, DO;  Location: [place];  Service: Pulmonary;  Laterality: Left;   Family History:  Family History  Problem Relation Age of Onset   Dementia Father    Family Psychiatric  History: unknown Social History:  Social History   Substance and Sexual Activity  Alcohol Use No   Alcohol/week: 0.0 standard drinks of alcohol   Comment: denies use of any drugs or alcohol     Social History   Substance and Sexual Activity  Drug Use No    Social History   Socioeconomic History   Marital status: Single    Spouse name: Not on file   Number of children: Not on file   Years of education: Not on file   Highest education level: Not on file  Occupational History   Occupation: retired   Tobacco Use   Smoking status: Former    Packs/day: 1.00    Years: 35.00    Total pack years: 35.00    Types: Cigarettes    Quit date: 05/2016    Years since quitting: 5.3   Smokeless tobacco: Never  Vaping Use   Vaping Use: Never used  Substance and Sexual Activity   Alcohol use: No    Alcohol/week: 0.0 standard drinks of alcohol    Comment: denies use of any drugs or alcohol   Drug use: No   Sexual activity: Not on file  Other Topics Concern   Not on file  Social History Narrative   Not on file   Social Determinants of Health   Financial Resource Strain: Low Risk  (09/23/2020)   Overall Financial Resource Strain (CARDIA)    Difficulty of Paying Living Expenses: Not hard at all  Food Insecurity: Not on file  Transportation Needs: No Transportation Needs (09/23/2020)   PRAPARE - Transportation    Lack of  Transportation (Medical): No    Lack of Transportation (Non-Medical): No  Physical Activity: Not on file  Stress: No Stress Concern Present (09/23/2020)   [place]    Feeling of Stress : Not at all  Social Connections: Not on file   Additional Social History:    Allergies:   Allergies  Allergen Reactions   Demerol [Meperidine] Nausea And Vomiting and Other (See Comments)    Made the patient "violently sick"   Zocor [Simvastatin] Nausea And Vomiting and Other (See Comments)    Made him very jittery, also   Beet [Beta Vulgaris] Nausea And Vomiting   Liver Nausea And Vomiting    Labs:  Results for orders placed or performed during the hospital encounter of 10/08/21 (from the past 48 hour(s))  Resp Panel by RT-PCR (Flu A&B, Covid) Anterior Nasal Swab     Status: None   Collection Time: 10/08/21  1:33 PM   Specimen: Anterior Nasal Swab  Result Value Ref Range   SARS Coronavirus 2 by RT PCR NEGATIVE NEGATIVE    Comment: (NOTE) SARS-CoV-2 target nucleic acids are NOT DETECTED.  The SARS-CoV-2 RNA  is generally detectable in upper respiratory specimens during the acute phase of infection. The lowest concentration of SARS-CoV-2 viral copies this assay can detect is 138 copies/mL. A negative result does not preclude SARS-Cov-2 infection and should not be used as the sole basis for treatment or other patient management decisions. A negative result may occur with  improper specimen collection/handling, submission of specimen other than nasopharyngeal swab, presence of viral mutation(s) within the areas targeted by this assay, and inadequate number of viral copies(<138 copies/mL). A negative result must be combined with clinical observations, patient history, and epidemiological information. The expected result is Negative.  Fact Sheet for Patients:  EntrepreneurPulse.com.au  Fact Sheet for Healthcare Providers:  IncredibleEmployment.be  This test is no t yet approved or cleared by the [place] FDA and  has been authorized for detection and/or diagnosis of SARS-CoV-2 by FDA under an Emergency Use Authorization (EUA). This EUA will remain  in effect (meaning this test can be used) for the duration of the COVID-19 declaration under Section 564(b)(1) of the Act, 21 U.S.C.section 360bbb-3(b)(1), unless the authorization is terminated  or revoked sooner.       Influenza A by PCR NEGATIVE NEGATIVE   Influenza B by PCR NEGATIVE NEGATIVE    Comment: (NOTE) The Xpert Xpress SARS-CoV-2/FLU/RSV plus assay is intended as an aid in the diagnosis of influenza from Nasopharyngeal swab specimens and should not be used as a sole basis for treatment. Nasal washings and aspirates are unacceptable for Xpert Xpress SARS-CoV-2/FLU/RSV testing.  Fact Sheet for Patients: EntrepreneurPulse.com.au  Fact Sheet for Healthcare Providers: IncredibleEmployment.be  This test is not yet approved or cleared by the [place] FDA  and has been authorized for detection and/or diagnosis of SARS-CoV-2 by FDA under an Emergency Use Authorization (EUA). This EUA will remain in effect (meaning this test can be used) for the duration of the COVID-19 declaration under Section 564(b)(1) of the Act, 21 U.S.C. section 360bbb-3(b)(1), unless the authorization is terminated or revoked.  Performed at [facility], [place] [address], [place], [place] [address]   Basic metabolic panel     Status: Abnormal   Collection Time: 10/08/21  1:45 PM  Result Value Ref Range   Sodium 139 135 - 145 mmol/L   Potassium 4.8 3.5 - 5.1 mmol/L  Chloride 104 98 - 111 mmol/L   CO2 26 22 - 32 mmol/L   Glucose, Bld 144 (H) 70 - 99 mg/dL    Comment: Glucose reference range applies only to samples taken after fasting for at least 8 hours.   BUN 10 8 - 23 mg/dL   Creatinine, Ser 0.99 0.61 - 1.24 mg/dL   Calcium 8.9 8.9 - 10.3 mg/dL   GFR, Estimated >60 >60 mL/min    Comment: (NOTE) Calculated using the CKD-EPI Creatinine Equation (2021)    Anion gap 9 5 - 15    Comment: Performed at [facility] [address], [place], [place] [address]  Troponin I (High Sensitivity)     Status: None   Collection Time: 10/08/21  1:45 PM  Result Value Ref Range   Troponin I (High Sensitivity) 8 <18 ng/L    Comment: (NOTE) Elevated high sensitivity troponin I (hsTnI) values and significant  changes across serial measurements may suggest ACS but many other  chronic and acute conditions are known to elevate hsTnI results.  Refer to the "Links" section for chest pain algorithms and additional  guidance. Performed at [facility], [place] [address], [place], [place] [address]   Acetaminophen level     Status: Abnormal   Collection Time: 10/08/21  1:45 PM  Result Value Ref Range   Acetaminophen (Tylenol), Serum <10 (L) 10 - 30 ug/mL    Comment: (NOTE) Therapeutic concentrations vary significantly. A range of 10-30 ug/mL  may be an effective  concentration for many patients. However, some  are best treated at concentrations outside of this range. Acetaminophen concentrations >150 ug/mL at 4 hours after ingestion  and >50 ug/mL at 12 hours after ingestion are often associated with  toxic reactions.  Performed at [facility], [place] [address], [place], [place] [address]   Salicylate level     Status: Abnormal   Collection Time: 10/08/21  1:45 PM  Result Value Ref Range   Salicylate Lvl <7.6 (L) 7.0 - 30.0 mg/dL    Comment: Performed at [facility] [address], [place], [place] [address]  Ethanol     Status: None   Collection Time: 10/08/21  1:45 PM  Result Value Ref Range   Alcohol, Ethyl (B) <10 <10 mg/dL    Comment: (NOTE) Lowest detectable limit for serum alcohol is 10 mg/dL.  For medical purposes only. Performed at [facility], [place] [address], [place], [place] [address]   CBC     Status: Abnormal   Collection Time: 10/08/21  2:15 PM  Result Value Ref Range   WBC 11.3 (H) 4.0 - 10.5 K/uL   RBC 4.27 4.22 - 5.81 MIL/uL   Hemoglobin 11.4 (L) 13.0 - 17.0 g/dL   HCT 36.3 (L) 39.0 - 52.0 %   MCV 85.0 80.0 - 100.0 fL   MCH 26.7 26.0 - 34.0 pg   MCHC 31.4 30.0 - 36.0 g/dL   RDW 16.1 (H) 11.5 - 15.5 %   Platelets 334 150 - 400 K/uL   nRBC 0.0 0.0 - 0.2 %    Comment: Performed at [facility] [address], [place],  [address]  Troponin I (High Sensitivity)     Status: None   Collection Time: 10/08/21  5:34 PM  Result Value Ref Range   Troponin I (High Sensitivity) 7 <18 ng/L    Comment: (NOTE) Elevated high sensitivity troponin I (hsTnI) values and significant  changes across serial measurements may suggest ACS but many other  chronic and acute conditions are known to elevate hsTnI results.  Refer to the "Links" section for chest pain algorithms and additional  guidance. Performed at [facility], [place] [address], [place], [place] [address]   Rapid urine drug screen  (hospital performed)     Status: Abnormal   Collection Time: 10/08/21  6:07 PM  Result Value Ref Range   Opiates POSITIVE (A) NONE DETECTED   Cocaine NONE DETECTED NONE DETECTED   Benzodiazepines NONE DETECTED NONE DETECTED   Amphetamines NONE DETECTED NONE DETECTED   Tetrahydrocannabinol NONE DETECTED NONE DETECTED   Barbiturates NONE DETECTED NONE DETECTED    Comment: (NOTE) DRUG SCREEN FOR MEDICAL PURPOSES ONLY.  IF CONFIRMATION IS NEEDED FOR ANY PURPOSE, NOTIFY LAB WITHIN 5 DAYS.  LOWEST DETECTABLE LIMITS FOR URINE DRUG SCREEN Drug Class                     Cutoff (ng/mL) Amphetamine and metabolites    1000 Barbiturate and metabolites    200 Benzodiazepine                 710 Tricyclics and metabolites     300 Opiates and metabolites        300 Cocaine and metabolites        300 THC                            50 Performed at [facility], [place] [address], [place], [place] [address]     Medications:  Current Facility-Administered Medications  Medication Dose Route Frequency Provider Last Rate Last Admin   acetaminophen (TYLENOL) tablet 650 mg  650 mg Oral Q6H PRN Dorie Rank, MD   650 mg at 10/08/21 2209   albuterol (PROVENTIL) (2.5 MG/3ML) 0.083% nebulizer solution 2.5 mg  2.5 mg Nebulization Q4H PRN Dorie Rank, MD       apixaban Arne Cleveland) tablet 5 mg  5 mg Oral BID Dorie Rank, MD   5 mg at 10/09/21 8546   arformoterol (BROVANA) nebulizer solution 15 mcg  15 mcg Nebulization BID Dorie Rank, MD   15 mcg at 10/09/21 2703   And   umeclidinium bromide (INCRUSE ELLIPTA) 62.5 MCG/ACT 1 puff  1 puff Inhalation Daily Dorie Rank, MD       budesonide (PULMICORT) nebulizer solution 0.5 mg  0.5 mg Inhalation BID PRN Dorie Rank, MD       buPROPion (WELLBUTRIN XL) 24 hr tablet 300 mg  300 mg Oral q morning Dorie Rank, MD       busPIRone (BUSPAR) tablet 15 mg  15 mg Oral Daily Dorie Rank, MD   15 mg at 10/09/21 5009   calcium-vitamin D (OSCAL WITH D) 500-5 MG-MCG per tablet 1  tablet  1 tablet Oral BID Dorie Rank, MD       diltiazem (CARDIZEM CD) 24 hr capsule 180 mg  180 mg Oral Daily Dorie Rank, MD   180 mg at 10/09/21 0804   docusate sodium (COLACE) capsule 100 mg  100 mg Oral BID Dorie Rank, MD   100 mg at 10/09/21 0807   doxycycline (VIBRA-TABS) tablet 100 mg  100 mg Oral Raul Del, MD   100 mg at 10/09/21 0807   DULoxetine (CYMBALTA) DR capsule 60 mg  60 mg Oral BID Dorie Rank, MD   60 mg at 10/09/21 [phone]  fentaNYL (DURAGESIC) 12 MCG/HR 1 patch  1 patch Transdermal Q72H Dorie Rank, MD   1 patch at 10/08/21 2214   gabapentin (NEURONTIN) capsule 900 mg  900 mg Oral QHS Dorie Rank, MD   900 mg at 10/08/21 2208   ondansetron (ZOFRAN-ODT) disintegrating tablet 4 mg  4 mg Oral Q8H PRN Dorie Rank, MD   4 mg at 10/08/21 2209   oxyCODONE (Oxy IR/ROXICODONE) immediate release tablet 5-10 mg  5-10 mg Oral Q4H PRN Dorie Rank, MD   10 mg at 10/09/21 0808   Current Outpatient Medications  Medication Sig Dispense Refill   acetaminophen (TYLENOL) 325 MG tablet Take 2 tablets (650 mg total) by mouth every 6 (six) hours as needed for up to 15 days for mild pain, moderate pain or headache. 45 tablet 0   albuterol (PROVENTIL) (2.5 MG/3ML) 0.083% nebulizer solution Inhale 3 mLs (2.5 mg total) by nebulization every 4 (four) hours as needed for wheezing or shortness of breath. (Patient taking differently: Take 2.5 mg by nebulization every 6 (six) hours as needed for wheezing or shortness of breath.) 90 mL 2   albuterol (VENTOLIN HFA) 108 (90 Base) MCG/ACT inhaler Inhale 2 puffs into the lungs 4 (four) times daily.     apixaban (ELIQUIS) 5 MG TABS tablet Take 5 mg by mouth 2 (two) times daily.     Carboxymethylcellulose Sod PF 0.5 % SOLN Place 1 drop into both eyes 4 (four) times daily as needed (dry eyes).     clobetasol ointment (TEMOVATE) 7.51 % Apply 1 Application topically 2 (two) times daily as needed (itching).     cyclobenzaprine (FLEXERIL) 10 MG tablet Take 30 mg by mouth  See admin instructions. 10 mg once daily for neck, headache, and muscular lower back pain + 30 mg at bedtime     cycloSPORINE (RESTASIS) 0.05 % ophthalmic emulsion Place 1 drop into both eyes every 12 (twelve) hours.     diltiazem (CARDIZEM CD) 180 MG 24 hr capsule Take 180 mg by mouth daily.     docusate sodium (COLACE) 100 MG capsule Take 100 mg by mouth 2 (two) times daily.     doxycycline (VIBRAMYCIN) 100 MG capsule Take 1 capsule (100 mg total) by mouth 2 (two) times daily for 7 days. 14 capsule 0   DULoxetine (CYMBALTA) 60 MG capsule Take 1 capsule (60 mg total) by mouth 2 (two) times daily. 60 capsule 0   fentaNYL (DURAGESIC) 12 MCG/HR Place 1 patch onto the skin every 3 (three) days. 10 patch 0   fluticasone (FLONASE) 50 MCG/ACT nasal spray Place 2 sprays into both nostrils daily.      folic acid (FOLVITE) 1 MG tablet Take 1 mg by mouth daily.     gabapentin (NEURONTIN) 300 MG capsule Take 3 capsules (900 mg total) by mouth at bedtime. 90 capsule 0   guaiFENesin (MUCINEX) 600 MG 12 hr tablet Take 600 mg by mouth 2 (two) times daily as needed for cough.     hydrOXYzine (ATARAX) 10 MG tablet Take 10 mg by mouth 2 (two) times daily as needed for anxiety.     lisinopril (ZESTRIL) 20 MG tablet Take 20 mg by mouth daily.     lurasidone (LATUDA) 40 MG TABS tablet Take 40 mg by mouth every evening. After supper     Melatonin 3 MG CAPS Take 6 mg by mouth at bedtime as needed (sleep).     metFORMIN (GLUCOPHAGE) 850 MG tablet Take 850 mg by mouth in the  morning and at bedtime.     methocarbamol (ROBAXIN) 750 MG tablet Take 750 mg by mouth 2 (two) times daily as needed (muscle tension).     methotrexate (RHEUMATREX) 2.5 MG tablet Take 15 mg by mouth once a week. Caution:Chemotherapy. Protect from light.     mineral oil enema Place 1 enema rectally 2 (two) times daily as needed for severe constipation.     Mometasone Furoate 200 MCG/ACT AERO Inhale 2 puffs into the lungs at bedtime as needed (shortness  of breath/wheezing).     Nutritional Supplements (ENSURE ORIGINAL) LIQD Take 1 Bottle by mouth 2 (two) times daily.     ondansetron (ZOFRAN-ODT) 4 MG disintegrating tablet Take 1 tablet (4 mg total) by mouth every 8 (eight) hours as needed for nausea or vomiting. 20 tablet 0   oxyCODONE (OXY IR/ROXICODONE) 5 MG immediate release tablet Take 1-2 tablets (5-10 mg total) by mouth every 4 (four) hours as needed for severe pain. 30 tablet 0   polyethylene glycol powder (GLYCOLAX/MIRALAX) 17 GM/SCOOP powder Take 17 g by mouth daily.     prednisoLONE acetate (PRED FORTE) 1 % ophthalmic suspension Place 1 drop into both eyes See admin instructions. Instill 1 drop into left eye every hour and follow taper schedule for Pred Forte for the right eye     predniSONE (DELTASONE) 50 MG tablet Take 50 mg by mouth daily with breakfast. 5 day course.     tamsulosin (FLOMAX) 0.4 MG CAPS capsule Take 0.8 mg by mouth daily.     Tiotropium Bromide-Olodaterol 2.5-2.5 MCG/ACT AERS Inhale 2 each into the lungs every morning. 2 puffs     traZODone (DESYREL) 50 MG tablet Take 75 mg by mouth at bedtime.     buPROPion (WELLBUTRIN XL) 300 MG 24 hr tablet Take 300 mg by mouth every morning.     busPIRone (BUSPAR) 15 MG tablet Take 15 mg by mouth daily.     mycophenolate (CELLCEPT) 250 MG capsule Take 1,500 mg by mouth 2 (two) times daily.     oxcarbazepine (TRILEPTAL) 600 MG tablet Take 300-600 mg by mouth See admin instructions. 600 mg twice daily, 300 mg at bedtime      Musculoskeletal: pt moves all extremities and ambulates independently Strength & Muscle Tone: within normal limits Gait & Station: normal Patient leans: N/A   Psychiatric Specialty Exam:  Presentation  General Appearance: Casual  Eye Contact:Good  Speech:Clear and Coherent; Normal Rate  Speech Volume:Normal  Handedness:Right   Mood and Affect  Mood:-- (Less depressed)  Affect:Congruent   Thought Process  Thought Processes:Coherent; Goal  Directed; Linear  Descriptions of Associations:Intact  Orientation:Full (Time, Place and Person)  Thought Content:Logical  History of Schizophrenia/Schizoaffective disorder:No data recorded Duration of Psychotic Symptoms:No data recorded Hallucinations:No data recorded Ideas of Reference:None  Suicidal Thoughts:No data recorded Homicidal Thoughts:No data recorded  Sensorium  Memory:Immediate Good; Recent Good; Remote Good  Judgment:Good  Insight:Good   Executive Functions  Concentration:Good  Attention Span:Good  [place] of Knowledge:Good  Language:Good   Psychomotor Activity  Psychomotor Activity:No data recorded  Assets  Assets:Communication Skills; Desire for Improvement; Social Support; Resilience   Sleep  Sleep:No data recorded   Physical Exam: Physical Exam Constitutional:      Appearance: He is obese.  Neurological:     General: No focal deficit present.     Mental Status: He is alert and oriented to person, place, and time.  Psychiatric:        Attention and Perception: Attention  and perception normal.        Mood and Affect: Mood and affect normal.        Speech: Speech normal.        Behavior: Behavior normal. Behavior is cooperative.        Thought Content: Thought content normal. Thought content is not paranoid or delusional. Thought content does not include homicidal or suicidal ideation. Thought content does not include homicidal or suicidal plan.        Cognition and Memory: Cognition and memory normal.        Judgment: Judgment normal.    Review of Systems  Psychiatric/Behavioral:  Positive for depression (currently take 4 psychotropic medications and gets care at the [place] [facility]). Negative for hallucinations, substance abuse and suicidal ideas. The patient is not nervous/anxious and does not have insomnia.    Blood pressure 138/73, pulse 100, temperature 97.7 F (36.5 C), temperature source Oral, resp. rate 19, height 6\' 2"   (1.88 m), weight 135.2 kg, SpO2 95 %. Body mass index is 38.26 kg/m.  Treatment Plan Summary: Patient with hx of depression and multiple medical comorbidities, reports to emergency department for chest pain, then reports suicidal ideations related to chronic illness, "cancer" in the setting of being off psychiatric medications for a few days.   After being restarted on his duloxetine, buspar and bupropion, patient no longer endorses suicidal ideations, no plan or intent for self harm and contracts for safety.  Given his high risk factors, extensive safety planning completed with patient, including low threshold for [facility] or returning to the emergency department should suicidal concerns return.  He is future oriented and relates his plan to follow-up with the [facility] for care.  Offered to refill psych meds but patient declines, states he has meds at home.  Plan- As per above assessment, there are no current grounds for involuntary commitment at this time.  Patient is not currently interested in inpatient services but expresses agreement to continue outpatient treatment., we have reviewed importance of medication adherence.  Disposition: Patient does not meet criteria for psychiatric inpatient admission. SW to add resources to follow-up at [facility] for medical and psychiatric care.  Recommend MH follow-up within the next 72 hours.  Above discussed with patient concordance.   This service was provided via telemedicine using a 2-way, interactive audio and video technology.   Spoke with Dr. Sherry Ruffing; Benito Mccreedy, RN; Glennie Isle LCSW were all informed of above recommendation and disposition  Names of all persons participating in this telemedicine service and their role in this encounter. Name: [name] Role: Patient  Name: Merlyn Lot Role: [place], NP 10/09/2021 10:00 AM

## 2021-10-09 NOTE — ED Notes (Signed)
Called [facility]- patient is next in line. Informed RN.

## 2021-10-11 ENCOUNTER — Emergency Department (HOSPITAL_COMMUNITY): Payer: No Typology Code available for payment source

## 2021-10-11 ENCOUNTER — Other Ambulatory Visit: Payer: Self-pay

## 2021-10-11 ENCOUNTER — Emergency Department (HOSPITAL_COMMUNITY)
Admission: EM | Admit: 2021-10-11 | Discharge: 2021-10-11 | Disposition: A | Discharge status: Home / Self Care | Payer: No Typology Code available for payment source | Attending: Emergency Medicine | Admitting: Emergency Medicine

## 2021-10-11 ENCOUNTER — Encounter (HOSPITAL_COMMUNITY): Payer: Self-pay

## 2021-10-11 DIAGNOSIS — Z7901 Long term (current) use of anticoagulants: Secondary | ICD-10-CM | POA: Diagnosis not present

## 2021-10-11 DIAGNOSIS — Z87891 Personal history of nicotine dependence: Secondary | ICD-10-CM | POA: Insufficient documentation

## 2021-10-11 DIAGNOSIS — Z79899 Other long term (current) drug therapy: Secondary | ICD-10-CM | POA: Diagnosis not present

## 2021-10-11 DIAGNOSIS — Z85118 Personal history of other malignant neoplasm of bronchus and lung: Secondary | ICD-10-CM | POA: Diagnosis not present

## 2021-10-11 DIAGNOSIS — I10 Essential (primary) hypertension: Secondary | ICD-10-CM | POA: Insufficient documentation

## 2021-10-11 DIAGNOSIS — R0602 Shortness of breath: Secondary | ICD-10-CM | POA: Diagnosis present

## 2021-10-11 DIAGNOSIS — J449 Chronic obstructive pulmonary disease, unspecified: Secondary | ICD-10-CM

## 2021-10-11 DIAGNOSIS — Z7984 Long term (current) use of oral hypoglycemic drugs: Secondary | ICD-10-CM | POA: Insufficient documentation

## 2021-10-11 DIAGNOSIS — Z20822 Contact with and (suspected) exposure to covid-19: Secondary | ICD-10-CM | POA: Insufficient documentation

## 2021-10-11 DIAGNOSIS — R0600 Dyspnea, unspecified: Secondary | ICD-10-CM

## 2021-10-11 LAB — BLOOD GAS, VENOUS
Acid-Base Excess: 7.2 mmol/L — ABNORMAL HIGH (ref 0.0–2.0)
Bicarbonate: 31.2 mmol/L — ABNORMAL HIGH (ref 20.0–28.0)
O2 Saturation: 93.8 %
Patient temperature: 37
pCO2, Ven: 41 mmHg — ABNORMAL LOW (ref 44–60)
pH, Ven: 7.49 — ABNORMAL HIGH (ref 7.25–7.43)
pO2, Ven: 61 mmHg — ABNORMAL HIGH (ref 32–45)

## 2021-10-11 LAB — BASIC METABOLIC PANEL
Anion gap: 8 (ref 5–15)
BUN: 15 mg/dL (ref 8–23)
CO2: 26 mmol/L (ref 22–32)
Calcium: 8.7 mg/dL — ABNORMAL LOW (ref 8.9–10.3)
Chloride: 102 mmol/L (ref 98–111)
Creatinine, Ser: 0.96 mg/dL (ref 0.61–1.24)
GFR, Estimated: >60 mL/min (ref >60)
Glucose, Bld: 107 mg/dL — ABNORMAL HIGH (ref 70–99)
Potassium: 4 mmol/L (ref 3.5–5.1)
Sodium: 136 mmol/L (ref 135–145)

## 2021-10-11 LAB — URINALYSIS, ROUTINE W REFLEX MICROSCOPIC
Bacteria, UA: NONE SEEN
Bilirubin Urine: NEGATIVE
Glucose, UA: NEGATIVE mg/dL
Hgb urine dipstick: NEGATIVE
Ketones, ur: 5 mg/dL — AB
Leukocytes,Ua: NEGATIVE
Nitrite: NEGATIVE
Protein, ur: 30 mg/dL — AB
Specific Gravity, Urine: >1.046 — ABNORMAL HIGH (ref 1.005–1.030)
pH: 7 (ref 5.0–8.0)

## 2021-10-11 LAB — RESP PANEL BY RT-PCR (FLU A&B, COVID) ARPGX2
Influenza A by PCR: NEGATIVE
Influenza B by PCR: NEGATIVE
SARS Coronavirus 2 by RT PCR: NEGATIVE

## 2021-10-11 LAB — BRAIN NATRIURETIC PEPTIDE: B Natriuretic Peptide: 31 pg/mL (ref 0.0–100.0)

## 2021-10-11 LAB — TROPONIN I (HIGH SENSITIVITY)
Troponin I (High Sensitivity): 6 ng/L (ref <18)
Troponin I (High Sensitivity): 6 ng/L (ref <18)

## 2021-10-11 MED ORDER — IPRATROPIUM-ALBUTEROL 0.5-2.5 (3) MG/3ML IN SOLN
3.0000 mL | Freq: Once | RESPIRATORY_TRACT | Status: AC
  Administered 2021-10-11: 3 mL via RESPIRATORY_TRACT
  Filled 2021-10-11: qty 3

## 2021-10-11 NOTE — ED Provider Notes (Addendum)
[facility] DEPT Provider Note   CSN: [id number] Arrival date & time: 10/11/21  0012     History  Chief Complaint  Patient presents with   Shortness of Breath    WILIAN KWONG is a 71 y.o. male.  Patient as above with significant medical history as below, including OSA on CPAP with good compliance, lung cancer 24-hour nasal cannula (dr Julien Nordmann), COPD, depression who presents to the ED with complaint of pain.  Patient reports that he was recently seen at the [place] earlier was diagnosed with diverticulitis, start oral antibiotics after CT imaging was obtained per the patient.  Reports he was ambulating in his home felt very short of breath, checked his pulse ox and it initially registered 80%.  He drank some water, sit down to catch his breath.  Pulse ox improved. While at rest he is breathing comfortably. Denies cough, congestion, chest pain, fevers or chills.  No recent falls.  No palpitations.  No increase to his lower extremity swelling.  He is compliant with his home oxygen.  Dyspnea only associate with exertion.  While at rest he has no significant dyspnea. He was started on doxy 2 days ago at [place]     Past Medical History:  Diagnosis Date   Anxiety    Bronchitis    COPD (chronic obstructive pulmonary disease) ([place])    Depression    History of radiation therapy    Left lung- 10/05/20-10/15/20- Dr. Gery Pray   Hypertension    lung ca 09/2020   MI (myocardial infarction) [facility])    ????   OSA (obstructive sleep apnea)    Suicide attempt ([place])    Tension pneumothorax 06/27/2016   Uveitis     Past Surgical History:  Procedure Laterality Date   BIOPSY  07/03/2021   Procedure: BIOPSY;  Surgeon: Otis Brace, MD;  Location: [facility];  Service: Gastroenterology;;   BRONCHIAL BIOPSY  07/30/2020   Procedure: BRONCHIAL BIOPSIES;  Surgeon: Garner Nash, DO;  Location: [facility];  Service: Pulmonary;;   BRONCHIAL BRUSHINGS  07/30/2020    Procedure: BRONCHIAL BRUSHINGS;  Surgeon: Garner Nash, DO;  Location: [facility];  Service: Pulmonary;;   BRONCHIAL NEEDLE ASPIRATION BIOPSY  07/30/2020   Procedure: BRONCHIAL NEEDLE ASPIRATION BIOPSIES;  Surgeon: Garner Nash, DO;  Location: [place];  Service: Pulmonary;;   BRONCHIAL WASHINGS  07/30/2020   Procedure: BRONCHIAL WASHINGS;  Surgeon: Garner Nash, DO;  Location: [facility];  Service: Pulmonary;;   CHEST TUBE INSERTION Left 06/27/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     ESOPHAGOGASTRODUODENOSCOPY N/A 07/03/2021   Procedure: ESOPHAGOGASTRODUODENOSCOPY (EGD);  Surgeon: Otis Brace, MD;  Location: [name];  Service: Gastroenterology;  Laterality: N/A;   IR PERC PLEURAL DRAIN W/INDWELL CATH W/IMG GUIDE  08/04/2021   IR REMOVAL OF PLURAL CATH W/CUFF  09/01/2021   SKIN CANCER EXCISION     VIDEO BRONCHOSCOPY WITH ENDOBRONCHIAL NAVIGATION Left 07/30/2020   Procedure: VIDEO BRONCHOSCOPY WITH ENDOBRONCHIAL NAVIGATION;  Surgeon: Garner Nash, DO;  Location: [facility];  Service: Pulmonary;  Laterality: Left;     The history is provided by the patient. No language interpreter was used.  Shortness of Breath Associated symptoms: abdominal pain   Associated symptoms: no chest pain, no cough, no fever, no headaches, no rash and no vomiting        Home Medications Prior to Admission medications   Medication Sig Start Date End Date Taking? Authorizing Provider  acetaminophen (TYLENOL) 325 MG tablet Take 2  tablets (650 mg total) by mouth every 6 (six) hours as needed for up to 15 days for mild pain, moderate pain or headache. 10/04/21 10/19/21  Delfin Gant, NP  albuterol (PROVENTIL) (2.5 MG/3ML) 0.083% nebulizer solution Inhale 3 mLs (2.5 mg total) by nebulization every 4 (four) hours as needed for wheezing or shortness of breath. Patient taking differently: Take 2.5 mg by nebulization every 6 (six) hours as needed for wheezing or shortness of breath. 08/04/20 10/02/22  Lavina Hamman, MD  albuterol (VENTOLIN HFA) 108 (90 Base) MCG/ACT inhaler Inhale 2 puffs into the lungs 4 (four) times daily.    [provider]  apixaban (ELIQUIS) 5 MG TABS tablet Take 5 mg by mouth 2 (two) times daily.    [provider]  buPROPion (WELLBUTRIN XL) 300 MG 24 hr tablet Take 300 mg by mouth every morning. 02/05/20   [provider]  busPIRone (BUSPAR) 15 MG tablet Take 15 mg by mouth daily.    [provider]  Carboxymethylcellulose Sod PF 0.5 % SOLN Place 1 drop into both eyes 4 (four) times daily as needed (dry eyes).    [provider]  clobetasol ointment (TEMOVATE) 0.10 % Apply 1 Application topically 2 (two) times daily as needed (itching).    [provider]  cyclobenzaprine (FLEXERIL) 10 MG tablet Take 30 mg by mouth See admin instructions. 10 mg once daily for neck, headache, and muscular lower back pain + 30 mg at bedtime    [provider]  cycloSPORINE (RESTASIS) 0.05 % ophthalmic emulsion Place 1 drop into both eyes every 12 (twelve) hours.    [provider]  diltiazem (CARDIZEM CD) 180 MG 24 hr capsule Take 180 mg by mouth daily.    [provider]  docusate sodium (COLACE) 100 MG capsule Take 100 mg by mouth 2 (two) times daily.    [provider]  doxycycline (VIBRAMYCIN) 100 MG capsule Take 1 capsule (100 mg total) by mouth 2 (two) times daily for 7 days. 10/09/21 10/16/21  Tegeler, Gwenyth Allegra, MD  DULoxetine (CYMBALTA) 60 MG capsule Take 1 capsule (60 mg total) by mouth 2 (two) times daily. 10/04/21 11/03/21  Delfin Gant, NP  fentaNYL (DURAGESIC) 12 MCG/HR Place 1 patch onto the skin every 3 (three) days. 09/28/21 10/28/21  Terrilee Croak, MD  fluticasone (FLONASE) 50 MCG/ACT nasal spray Place 2 sprays into both nostrils daily.     [provider]  folic acid (FOLVITE) 1 MG tablet Take 1 mg by mouth daily.    [provider]  gabapentin (NEURONTIN) 300 MG capsule  Take 3 capsules (900 mg total) by mouth at bedtime. 10/04/21 11/03/21  Delfin Gant, NP  guaiFENesin (MUCINEX) 600 MG 12 hr tablet Take 600 mg by mouth 2 (two) times daily as needed for cough.    [provider]  hydrOXYzine (ATARAX) 10 MG tablet Take 10 mg by mouth 2 (two) times daily as needed for anxiety.    [provider]  lisinopril (ZESTRIL) 20 MG tablet Take 20 mg by mouth daily.    [provider]  lurasidone (LATUDA) 40 MG TABS tablet Take 40 mg by mouth every evening. After supper    [provider]  Melatonin 3 MG CAPS Take 6 mg by mouth at bedtime as needed (sleep).    [provider]  metFORMIN (GLUCOPHAGE) 850 MG tablet Take 850 mg by mouth in the morning and at bedtime.    [provider]  methocarbamol (ROBAXIN) 750 MG tablet Take 750 mg by mouth 2 (two) times daily as needed (muscle tension).    [provider]  methotrexate (RHEUMATREX) 2.5 MG tablet Take 15 mg by mouth once a week. Caution:Chemotherapy. Protect from light.    [provider]  mineral oil enema Place 1 enema rectally 2 (two) times daily as needed for severe constipation.    [provider]  Mometasone Furoate 200 MCG/ACT AERO Inhale 2 puffs into the lungs at bedtime as needed (shortness of breath/wheezing).    [provider]  mycophenolate (CELLCEPT) 250 MG capsule Take 1,500 mg by mouth 2 (two) times daily.    [provider]  Nutritional Supplements (ENSURE ORIGINAL) LIQD Take 1 Bottle by mouth 2 (two) times daily.    [provider]  ondansetron (ZOFRAN-ODT) 4 MG disintegrating tablet Take 1 tablet (4 mg total) by mouth every 8 (eight) hours as needed for nausea or vomiting. 10/04/21   Delfin Gant, NP  oxcarbazepine (TRILEPTAL) 600 MG tablet Take 300-600 mg by mouth See admin instructions. 600 mg twice daily, 300 mg at bedtime    [provider]  oxyCODONE (OXY IR/ROXICODONE) 5 MG  immediate release tablet Take 1-2 tablets (5-10 mg total) by mouth every 4 (four) hours as needed for severe pain. 1/61/09   Delora Fuel, MD  polyethylene glycol powder [facility]) 17 GM/SCOOP powder Take 17 g by mouth daily.    [provider]  prednisoLONE acetate (PRED FORTE) 1 % ophthalmic suspension Place 1 drop into both eyes See admin instructions. Instill 1 drop into left eye every hour and follow taper schedule for Pred Forte for the right eye    [provider]  predniSONE (DELTASONE) 50 MG tablet Take 50 mg by mouth daily with breakfast. 5 day course.    [provider]  tamsulosin (FLOMAX) 0.4 MG CAPS capsule Take 0.8 mg by mouth daily.    [provider]  Tiotropium Bromide-Olodaterol 2.5-2.5 MCG/ACT AERS Inhale 2 each into the lungs every morning. 2 puffs    [provider]  traZODone (DESYREL) 50 MG tablet Take 75 mg by mouth at bedtime.    [provider]      Allergies    Demerol [meperidine], Zocor [simvastatin], Beet [beta vulgaris], and Liver    Review of Systems   Review of Systems  Constitutional:  Negative for chills and fever.  HENT:  Negative for facial swelling and trouble swallowing.   Eyes:  Negative for photophobia and visual disturbance.  Respiratory:  Positive for shortness of breath. Negative for cough.   Cardiovascular:  Negative for chest pain and palpitations.  Gastrointestinal:  Positive for abdominal pain and constipation. Negative for nausea and vomiting.  Endocrine: Negative for polydipsia and polyuria.  Genitourinary:  Negative for difficulty urinating and hematuria.  Musculoskeletal:  Negative for gait problem and joint swelling.  Skin:  Negative for pallor and rash.  Neurological:  Negative for syncope and headaches.  Psychiatric/Behavioral:  Negative for agitation and confusion.     Physical Exam Updated Vital Signs BP 128/66   Pulse 79   Temp 98.2 F (36.8 C)   Resp 20   Ht 6'  2" (1.88 m)   Wt 135.2 kg   SpO2 90%   BMI 38.27 kg/m  Physical Exam Vitals and nursing note reviewed.  Constitutional:      General: He is not in acute distress.    Appearance: He is well-developed.  HENT:  Head: Normocephalic and atraumatic.     Right Ear: External ear normal.     Left Ear: External ear normal.     Mouth/Throat:     Mouth: Mucous membranes are moist.  Eyes:     General: No scleral icterus. Cardiovascular:     Rate and Rhythm: Normal rate and regular rhythm.     Pulses: Normal pulses.     Heart sounds: Normal heart sounds.  Pulmonary:     Effort: Pulmonary effort is normal. No tachypnea, accessory muscle usage or respiratory distress.     Breath sounds: Decreased air movement present. Decreased breath sounds present.  Abdominal:     General: Abdomen is flat.     Palpations: Abdomen is soft.     Tenderness: There is abdominal tenderness. There is no guarding or rebound.    Musculoskeletal:        General: Normal range of motion.     Cervical back: Normal range of motion.     Right lower leg: No edema.     Left lower leg: No edema.  Skin:    General: Skin is warm and dry.     Capillary Refill: Capillary refill takes less than 2 seconds.  Neurological:     Mental Status: He is alert and oriented to person, place, and time.  Psychiatric:        Mood and Affect: Mood normal.        Behavior: Behavior normal.     ED Results / Procedures / Treatments   Labs (all labs ordered are listed, but only abnormal results are displayed) Labs Reviewed  BASIC METABOLIC PANEL - Abnormal; Notable for the following components:      Result Value   Glucose, Bld 107 (*)    Calcium 8.7 (*)    All other components within normal limits  BLOOD GAS, VENOUS - Abnormal; Notable for the following components:   pH, Ven 7.49 (*)    pCO2, Ven 41 (*)    pO2, Ven 61 (*)    Bicarbonate 31.2 (*)    Acid-Base Excess 7.2 (*)    All other components within normal limits   URINALYSIS, ROUTINE W REFLEX MICROSCOPIC - Abnormal; Notable for the following components:   Specific Gravity, Urine >1.046 (*)    Ketones, ur 5 (*)    Protein, ur 30 (*)    All other components within normal limits  RESP PANEL BY RT-PCR (FLU A&B, COVID) ARPGX2  BRAIN NATRIURETIC PEPTIDE  TROPONIN I (HIGH SENSITIVITY)  TROPONIN I (HIGH SENSITIVITY)    EKG EKG Interpretation  Date/Time:  Monday October 11 2021 00:21:24 EDT Ventricular Rate:  87 PR Interval:  186 QRS Duration: 106 QT Interval:  368 QTC Calculation: 443 R Axis:   3 Text Interpretation: Sinus rhythm no stemi Confirmed by Wynona Dove ([id number]) on 10/11/2021 12:56:18 AM  Radiology DG Chest Port 1 View  Result Date: 10/11/2021 CLINICAL DATA:  Shortness of breath. EXAM: PORTABLE CHEST 1 VIEW COMPARISON:  October 08, 2021 FINDINGS: The heart size and mediastinal contours are within normal limits. Stable mild to moderate severity areas of atelectasis and/or infiltrate are seen within the bilateral lung bases, left greater than right. There is a small, stable left pleural effusion. No pneumothorax is identified. The visualized skeletal structures are unremarkable. IMPRESSION: 1. Stable mild to moderate severity bibasilar atelectasis and/or infiltrate. 2. Small, stable left pleural effusion. Electronically Signed   By: Virgina Norfolk M.D.   On: 10/11/2021 01:09  Procedures Procedures    Medications Ordered in ED Medications  ipratropium-albuterol (DUONEB) 0.5-2.5 (3) MG/3ML nebulizer solution 3 mL (3 mLs Nebulization Given 10/11/21 0126)    ED Course/ Medical Decision Making/ A&P                           Medical Decision Making Amount and/or Complexity of Data Reviewed Labs: ordered. Radiology: ordered.  Risk Prescription drug management.   This patient presents to the ED with chief complaint(s) of dyspnea with pertinent past medical history of cancer, COPD, OSA, bronchitis which further complicates the  presenting complaint. The complaint involves an extensive differential diagnosis and also carries with it a high risk of complications and morbidity.    In my evaluation of this patient's dyspnea my DDx includes, but is not limited to, pneumonia, pulmonary embolism, pneumothorax, pulmonary edema, metabolic acidosis, asthma, COPD, cardiac cause, anemia, anxiety, etc.   Serious etiologies were considered.   The initial plan is to screening labs, x-ray   Additional history obtained: Additional history obtained from  n/a Records reviewed Care Everywhere/External Records and prior ED visits, home meds, prior labs/imaging   Independent labs interpretation:  The following labs were independently interpreted:  VBG similar to prior.  Troponin negative x2, BMP stable. Viral panel negative  Independent visualization of imaging: - I independently visualized the following imaging with scope of interpretation limited to determining acute life threatening conditions related to emergency care: CXR, which revealed chronic abnormalities, no acute changes stable pleural effusion to the left  Cardiac monitoring was reviewed and interpreted by myself which shows NSR ECG without acute ischemic changes  Treatment and Reassessment: Nebulized breathing treatment > Symptoms improved, pulse ox stable with exertion in the ED on his home ox level at 3-3.5LPM   Consultation: - Consulted or discussed management/test interpretation w/ external professional: n/a  Consideration for admission or further workup: Admission was considered;   ECG without acute ischemia, troponin negative x2, chest x-ray stable.  No ongoing chest pain.  ACS is unlikely.  Patient with progressive COPD, lung cancer.  No evidence of acute exacerbation today. [place] presentation today likely secondary to progression of his underlying chronic illness. Recommend patient follow-up with oncologist, pulmonologist and PCP.  Low suspicion for  acute emergent condition today.  Advised patient to continue doxycycline as prescribed by [place].  Continue home inhalers.  Follow-up pulmonology.   The patient improved significantly and was discharged in stable condition. Detailed discussions were had with the patient regarding current findings, and need for close f/u with PCP or on call doctor. The patient has been instructed to return immediately if the symptoms worsen in any way for re-evaluation. Patient verbalized understanding and is in agreement with current care plan. All questions answered prior to discharge.    Social Determinants of health: Social History   Tobacco Use   Smoking status: Former    Packs/day: 1.00    Years: 35.00    Total pack years: 35.00    Types: Cigarettes    Quit date: 05/2016    Years since quitting: 5.3   Smokeless tobacco: Never  Vaping Use   Vaping Use: Never used  Substance Use Topics   Alcohol use: No    Alcohol/week: 0.0 standard drinks of alcohol    Comment: denies use of any drugs or alcohol   Drug use: No            Final Clinical Impression(s) / ED Diagnoses Final  diagnoses:  Chronic obstructive pulmonary disease, unspecified COPD type ([place])  Dyspnea, unspecified type    Rx / DC Orders ED Discharge Orders     None         Jeanell Sparrow, DO 10/11/21 0502    Jeanell Sparrow, DO 10/11/21 0503

## 2021-10-11 NOTE — ED Triage Notes (Signed)
Pt was SOB when walking downstairs.  He told EMS that his O2 dropped to 80% on his 3.5 L O2 that he is on all the time.  Pt was seen at [facility] today and diagnosed with Diverticulitis.  He is on Amoxicillin.

## 2021-10-11 NOTE — Discharge Instructions (Addendum)
It was a pleasure caring for you today in the emergency department.  Please return to the emergency department for any worsening or worrisome symptoms.  Please continue taking antibiotics as prescribed by [facility]

## 2021-10-12 ENCOUNTER — Emergency Department (HOSPITAL_COMMUNITY)
Admission: EM | Admit: 2021-10-12 | Discharge: 2021-10-13 | Disposition: A | Discharge status: Home / Self Care | Payer: No Typology Code available for payment source | Attending: Emergency Medicine | Admitting: Emergency Medicine

## 2021-10-12 ENCOUNTER — Emergency Department (HOSPITAL_COMMUNITY): Payer: No Typology Code available for payment source

## 2021-10-12 ENCOUNTER — Other Ambulatory Visit: Payer: Self-pay

## 2021-10-12 ENCOUNTER — Encounter (HOSPITAL_COMMUNITY): Payer: Self-pay | Admitting: Oncology

## 2021-10-12 DIAGNOSIS — R55 Syncope and collapse: Secondary | ICD-10-CM | POA: Insufficient documentation

## 2021-10-12 DIAGNOSIS — Z79899 Other long term (current) drug therapy: Secondary | ICD-10-CM | POA: Insufficient documentation

## 2021-10-12 DIAGNOSIS — K5790 Diverticulosis of intestine, part unspecified, without perforation or abscess without bleeding: Secondary | ICD-10-CM | POA: Diagnosis not present

## 2021-10-12 DIAGNOSIS — R42 Dizziness and giddiness: Secondary | ICD-10-CM | POA: Insufficient documentation

## 2021-10-12 DIAGNOSIS — Z7901 Long term (current) use of anticoagulants: Secondary | ICD-10-CM | POA: Diagnosis not present

## 2021-10-12 DIAGNOSIS — E1122 Type 2 diabetes mellitus with diabetic chronic kidney disease: Secondary | ICD-10-CM | POA: Insufficient documentation

## 2021-10-12 DIAGNOSIS — J449 Chronic obstructive pulmonary disease, unspecified: Secondary | ICD-10-CM | POA: Insufficient documentation

## 2021-10-12 DIAGNOSIS — Z7984 Long term (current) use of oral hypoglycemic drugs: Secondary | ICD-10-CM | POA: Insufficient documentation

## 2021-10-12 DIAGNOSIS — R531 Weakness: Secondary | ICD-10-CM | POA: Diagnosis not present

## 2021-10-12 DIAGNOSIS — N189 Chronic kidney disease, unspecified: Secondary | ICD-10-CM | POA: Diagnosis not present

## 2021-10-12 DIAGNOSIS — Z7951 Long term (current) use of inhaled steroids: Secondary | ICD-10-CM | POA: Insufficient documentation

## 2021-10-12 DIAGNOSIS — I129 Hypertensive chronic kidney disease with stage 1 through stage 4 chronic kidney disease, or unspecified chronic kidney disease: Secondary | ICD-10-CM | POA: Insufficient documentation

## 2021-10-12 LAB — COMPREHENSIVE METABOLIC PANEL
ALT: 12 U/L (ref 0–44)
AST: 14 U/L — ABNORMAL LOW (ref 15–41)
Albumin: 3.3 g/dL — ABNORMAL LOW (ref 3.5–5.0)
Alkaline Phosphatase: 66 U/L (ref 38–126)
Anion gap: 8 (ref 5–15)
BUN: 12 mg/dL (ref 8–23)
CO2: 26 mmol/L (ref 22–32)
Calcium: 8.6 mg/dL — ABNORMAL LOW (ref 8.9–10.3)
Chloride: 101 mmol/L (ref 98–111)
Creatinine, Ser: 1.09 mg/dL (ref 0.61–1.24)
GFR, Estimated: >60 mL/min (ref >60)
Glucose, Bld: 145 mg/dL — ABNORMAL HIGH (ref 70–99)
Potassium: 4.3 mmol/L (ref 3.5–5.1)
Sodium: 135 mmol/L (ref 135–145)
Total Bilirubin: 0.2 mg/dL — ABNORMAL LOW (ref 0.3–1.2)
Total Protein: 6.6 g/dL (ref 6.5–8.1)

## 2021-10-12 LAB — CBC WITH DIFFERENTIAL/PLATELET
Abs Immature Granulocytes: 0.03 10*3/uL (ref 0.00–0.07)
Basophils Absolute: 0 10*3/uL (ref 0.0–0.1)
Basophils Relative: 0 %
Eosinophils Absolute: 0.2 10*3/uL (ref 0.0–0.5)
Eosinophils Relative: 2 %
HCT: 35.3 % — ABNORMAL LOW (ref 39.0–52.0)
Hemoglobin: 10.8 g/dL — ABNORMAL LOW (ref 13.0–17.0)
Immature Granulocytes: 0 %
Lymphocytes Relative: 12 %
Lymphs Abs: 1.3 10*3/uL (ref 0.7–4.0)
MCH: 26 pg (ref 26.0–34.0)
MCHC: 30.6 g/dL (ref 30.0–36.0)
MCV: 84.9 fL (ref 80.0–100.0)
Monocytes Absolute: 1 10*3/uL (ref 0.1–1.0)
Monocytes Relative: 10 %
Neutro Abs: 7.9 10*3/uL — ABNORMAL HIGH (ref 1.7–7.7)
Neutrophils Relative %: 76 %
Platelets: 380 10*3/uL (ref 150–400)
RBC: 4.16 MIL/uL — ABNORMAL LOW (ref 4.22–5.81)
RDW: 16.2 % — ABNORMAL HIGH (ref 11.5–15.5)
WBC: 10.4 10*3/uL (ref 4.0–10.5)
nRBC: 0 % (ref 0.0–0.2)

## 2021-10-12 LAB — URINALYSIS, ROUTINE W REFLEX MICROSCOPIC
Bilirubin Urine: NEGATIVE
Glucose, UA: NEGATIVE mg/dL
Hgb urine dipstick: NEGATIVE
Ketones, ur: 5 mg/dL — AB
Nitrite: NEGATIVE
Protein, ur: 30 mg/dL — AB
Specific Gravity, Urine: 1.027 (ref 1.005–1.030)
pH: 7 (ref 5.0–8.0)

## 2021-10-12 LAB — MAGNESIUM: Magnesium: 1.9 mg/dL (ref 1.7–2.4)

## 2021-10-12 LAB — CBG MONITORING, ED: Glucose-Capillary: 144 mg/dL — ABNORMAL HIGH (ref 70–99)

## 2021-10-12 MED ORDER — IPRATROPIUM-ALBUTEROL 0.5-2.5 (3) MG/3ML IN SOLN
3.0000 mL | Freq: Once | RESPIRATORY_TRACT | Status: AC
  Administered 2021-10-12: 3 mL via RESPIRATORY_TRACT
  Filled 2021-10-12: qty 3

## 2021-10-12 NOTE — ED Triage Notes (Signed)
Pt bib EMS d/t dx of diverticulitis he was given from the [place] yesterday.  Pt states this morning he was dizzy and lightheaded this morning.

## 2021-10-12 NOTE — ED Provider Notes (Signed)
[facility] DEPT Provider Note   CSN: [id number] Arrival date & time: 10/12/21  1546     History {Add pertinent medical, surgical, social history, OB history to HPI:1} Chief Complaint  Patient presents with   Diverticulitis    Mario [name] is a 71 y.o. male.  HPI Patient presents for episodes of near syncope.  His medical history includes HTN, DM2, CKD, depression, OSA, COPD, NSCLC, and anxiety.  He reports a recent diagnosis of diverticulitis at the [place].  He states that he was started on amoxicillin which he has been taking.  He has had improved abdominal pain.  He did not have diarrhea.  He was seen in the ED 2 nights ago for concern of shortness of breath and hypoxia.  He underwent lab work and chest x-ray.  He was given DuoNeb and discharged.  Today, he reports that he woke up this morning and had a difficult time sitting up in the bed due to dizziness and generalized weakness.  He had a difficult time ambulating to the bathroom.  He went back to bed and subsequently woke up later in the day.  He experienced similar symptoms.  Again, he went back to bed and when he woke up a third time, he had improved but residual symptoms.  He called EMS at that time.  He states that he felt off balance when walking to the EMS stretcher.  Currently, patient states that his breathing feels at baseline.  He denies any areas of discomfort other than his chronic back pain.    Home Medications Prior to Admission medications   Medication Sig Start Date End Date Taking? Authorizing Provider  acetaminophen (TYLENOL) 325 MG tablet Take 2 tablets (650 mg total) by mouth every 6 (six) hours as needed for up to 15 days for mild pain, moderate pain or headache. 10/04/21 10/19/21  Delfin Gant, NP  albuterol (PROVENTIL) (2.5 MG/3ML) 0.083% nebulizer solution Inhale 3 mLs (2.5 mg total) by nebulization every 4 (four) hours as needed for wheezing or shortness of breath. Patient  taking differently: Take 2.5 mg by nebulization every 6 (six) hours as needed for wheezing or shortness of breath. 08/04/20 10/02/22  Lavina Hamman, MD  albuterol (VENTOLIN HFA) 108 (90 Base) MCG/ACT inhaler Inhale 2 puffs into the lungs 4 (four) times daily.    [provider]  apixaban (ELIQUIS) 5 MG TABS tablet Take 5 mg by mouth 2 (two) times daily.    [provider]  buPROPion (WELLBUTRIN XL) 300 MG 24 hr tablet Take 300 mg by mouth every morning. 02/05/20   [provider]  busPIRone (BUSPAR) 15 MG tablet Take 15 mg by mouth daily.    [provider]  Carboxymethylcellulose Sod PF 0.5 % SOLN Place 1 drop into both eyes 4 (four) times daily as needed (dry eyes).    [provider]  clobetasol ointment (TEMOVATE) 1.28 % Apply 1 Application topically 2 (two) times daily as needed (itching).    [provider]  cyclobenzaprine (FLEXERIL) 10 MG tablet Take 30 mg by mouth See admin instructions. 10 mg once daily for neck, headache, and muscular lower back pain + 30 mg at bedtime    [provider]  cycloSPORINE (RESTASIS) 0.05 % ophthalmic emulsion Place 1 drop into both eyes every 12 (twelve) hours.    [provider]  diltiazem (CARDIZEM CD) 180 MG 24 hr capsule Take 180 mg by mouth daily.    [provider]  docusate sodium (COLACE) 100 MG capsule Take 100 mg by mouth 2 (two) times daily.    [provider]  doxycycline (VIBRAMYCIN) 100 MG capsule Take 1 capsule (100 mg total) by mouth 2 (two) times daily for 7 days. 10/09/21 10/16/21  Tegeler, Gwenyth Allegra, MD  DULoxetine (CYMBALTA) 60 MG capsule Take 1 capsule (60 mg total) by mouth 2 (two) times daily. 10/04/21 11/03/21  Delfin Gant, NP  fentaNYL (DURAGESIC) 12 MCG/HR Place 1 patch onto the skin every 3 (three) days. 09/28/21 10/28/21  Terrilee Croak, MD  fluticasone (FLONASE) 50 MCG/ACT nasal spray Place 2 sprays into both nostrils daily.     [provider]  folic acid (FOLVITE) 1 MG tablet Take 1 mg by mouth daily.    [provider]  gabapentin (NEURONTIN) 300 MG capsule Take 3 capsules (900 mg total) by mouth at bedtime. 10/04/21 11/03/21  Delfin Gant, NP  guaiFENesin (MUCINEX) 600 MG 12 hr tablet Take 600 mg by mouth 2 (two) times daily as needed for cough.    [provider]  hydrOXYzine (ATARAX) 10 MG tablet Take 10 mg by mouth 2 (two) times daily as needed for anxiety.    [provider]  lisinopril (ZESTRIL) 20 MG tablet Take 20 mg by mouth daily.    [provider]  lurasidone (LATUDA) 40 MG TABS tablet Take 40 mg by mouth every evening. After supper    [provider]  Melatonin 3 MG CAPS Take 6 mg by mouth at bedtime as needed (sleep).    [provider]  metFORMIN (GLUCOPHAGE) 850 MG tablet Take 850 mg by mouth in the morning and at bedtime.    [provider]  methocarbamol (ROBAXIN) 750 MG tablet Take 750 mg by mouth 2 (two) times daily as needed (muscle tension).    [provider]  methotrexate (RHEUMATREX) 2.5 MG tablet Take 15 mg by mouth once a week. Caution:Chemotherapy. Protect from light.    [provider]  mineral oil enema Place 1 enema rectally 2 (two) times daily as needed for severe constipation.    [provider]  Mometasone Furoate 200 MCG/ACT AERO Inhale 2 puffs into the lungs at bedtime as needed (shortness of breath/wheezing).    [provider]  mycophenolate (CELLCEPT) 250 MG capsule Take 1,500 mg by mouth 2 (two) times daily.    [provider]  Nutritional Supplements (ENSURE ORIGINAL) LIQD Take 1 Bottle by mouth 2 (two) times daily.    [provider]  ondansetron (ZOFRAN-ODT) 4 MG disintegrating tablet Take 1 tablet (4 mg total) by mouth every 8 (eight) hours as needed for nausea or vomiting. 10/04/21   Delfin Gant, NP  oxcarbazepine (TRILEPTAL) 600 MG tablet Take 300-600 mg  by mouth See admin instructions. 600 mg twice daily, 300 mg at bedtime    [provider]  oxyCODONE (OXY IR/ROXICODONE) 5 MG immediate release tablet Take 1-2 tablets (5-10 mg total) by mouth every 4 (four) hours as needed for severe pain. 6/96/29   Delora Fuel, MD  polyethylene glycol powder [facility]) 17 GM/SCOOP powder Take 17 g by mouth daily.    [provider]  prednisoLONE acetate (PRED FORTE) 1 % ophthalmic suspension Place 1 drop into both eyes See admin instructions. Instill 1 drop into left eye every hour and follow taper schedule for Pred Forte for the right eye    [provider]  predniSONE (DELTASONE) 50 MG tablet Take 50 mg by  mouth daily with breakfast. 5 day course.    [provider]  tamsulosin (FLOMAX) 0.4 MG CAPS capsule Take 0.8 mg by mouth daily.    [provider]  Tiotropium Bromide-Olodaterol 2.5-2.5 MCG/ACT AERS Inhale 2 each into the lungs every morning. 2 puffs    [provider]  traZODone (DESYREL) 50 MG tablet Take 75 mg by mouth at bedtime.    [provider]      Allergies    Demerol [meperidine], Zocor [simvastatin], Beet [beta vulgaris], and Liver    Review of Systems   Review of Systems  Musculoskeletal:  Positive for back pain (Chronic).  Neurological:  Positive for dizziness, weakness (Generalized) and light-headedness.  All other systems reviewed and are negative.   Physical Exam Updated Vital Signs BP 128/66   Pulse 75   Temp 98.4 F (36.9 C)   Resp 17   Ht 6\' 2"  (1.88 m)   Wt (!) 138.8 kg   SpO2 90%   BMI 39.29 kg/m  Physical Exam Vitals and nursing note reviewed.  Constitutional:      General: He is not in acute distress.    Appearance: He is well-developed. He is obese. He is not ill-appearing, toxic-appearing or diaphoretic.  HENT:     Head: Normocephalic and atraumatic.     Right Ear: External ear normal.     Left Ear: External ear normal.     Nose: Nose  normal.     Mouth/Throat:     Mouth: Mucous membranes are moist.     Pharynx: Oropharynx is clear.  Eyes:     Extraocular Movements: Extraocular movements intact.     Conjunctiva/sclera: Conjunctivae normal.  Cardiovascular:     Rate and Rhythm: Normal rate and regular rhythm.     Heart sounds: No murmur heard. Pulmonary:     Effort: Pulmonary effort is normal. No respiratory distress.     Breath sounds: Wheezing (End expiratory) present.     Comments: On baseline 3 L of supplemental oxygen Abdominal:     Palpations: Abdomen is soft.     Tenderness: There is abdominal tenderness (Suprapubic).  Musculoskeletal:        General: No swelling or deformity. Normal range of motion.     Cervical back: Normal range of motion and neck supple.  Skin:    General: Skin is warm and dry.     Capillary Refill: Capillary refill takes less than 2 seconds.     Coloration: Skin is not jaundiced or pale.  Neurological:     General: No focal deficit present.     Mental Status: He is alert and oriented to person, place, and time.     Cranial Nerves: No cranial nerve deficit.     Sensory: No sensory deficit.     Motor: No weakness.     Coordination: Coordination normal.  Psychiatric:        Mood and Affect: Mood normal.        Behavior: Behavior normal.     ED Results / Procedures / Treatments   Labs (all labs ordered are listed, but only abnormal results are displayed) Labs Reviewed - No data to display  EKG None  Radiology DG Chest [facility] 1 View  Result Date: 10/11/2021 CLINICAL DATA:  Shortness of breath. EXAM: PORTABLE CHEST 1 VIEW COMPARISON:  October 08, 2021 FINDINGS: The heart size and mediastinal contours are within normal limits. Stable mild to moderate severity areas of atelectasis and/or infiltrate are seen within  the bilateral lung bases, left greater than right. There is a small, stable left pleural effusion. No pneumothorax is identified. The visualized skeletal structures are  unremarkable. IMPRESSION: 1. Stable mild to moderate severity bibasilar atelectasis and/or infiltrate. 2. Small, stable left pleural effusion. Electronically Signed   By: Virgina Norfolk M.D.   On: 10/11/2021 01:09    Procedures Procedures  {Document cardiac monitor, telemetry assessment procedure when appropriate:1}  Medications Ordered in ED Medications - No data to display  ED Course/ Medical Decision Making/ A&P                           Medical Decision Making Amount and/or Complexity of Data Reviewed Labs: ordered. Radiology: ordered.   This patient presents to the ED for concern of ***, this involves an extensive number of treatment options, and is a complaint that carries with it a high risk of complications and morbidity.  The differential diagnosis includes ***   Co morbidities that complicate the patient evaluation  ***   Additional history obtained:  Additional history obtained from *** External records from outside source obtained and reviewed including ***   Lab Tests:  I Ordered, and personally interpreted labs.  The pertinent results include:  ***   Imaging Studies ordered:  I ordered imaging studies including ***  I independently visualized and interpreted imaging which showed *** I agree with the radiologist interpretation   Cardiac Monitoring: / EKG:  The patient was maintained on a cardiac monitor.  I personally viewed and interpreted the cardiac monitored which showed an underlying rhythm of: ***   Consultations Obtained:  I requested consultation with the ***,  and discussed lab and imaging findings as well as pertinent plan - they recommend: ***   Problem List / ED Course / Critical interventions / Medication management  Patient presents for episodes of near syncope earlier today.  He describes a generalized weakness, dizziness, and unsteadiness on his feet.  On arrival, patient's vital signs are reassuring.  On exam, he has no focal  neurologic deficits.  He is alert and oriented.  He is on his home 3 L of supplemental oxygen.  SPO2 is slightly decreased to 88%.  Supplemental oxygen was increased to 4 L.  Patient does have a slight end expiratory wheeze on lung auscultation.  DuoNeb was ordered.  Exam is also notable for suprapubic tenderness.  This could be secondary to his recently diagnosed diverticulitis or could be consistent with a cystitis.  Patient does describe the sensation of incomplete voiding when he does urinate.  Urinalysis was ordered.  Patient underwent additional diagnostic work-up to assess for possible etiologies of his syncopal symptoms.***. I ordered medication including ***  for ***  Reevaluation of the patient after these medicines showed that the patient {resolved/improved/worsened:23923::"improved"} I have reviewed the patients home medicines and have made adjustments as needed   Social Determinants of Health:  ***   Test / Admission - Considered:  ***   {Document critical care time when appropriate:1} {Document review of labs and clinical decision tools ie heart score, Chads2Vasc2 etc:1}  {Document your independent review of radiology images, and any outside records:1} {Document your discussion with family members, caretakers, and with consultants:1} {Document social determinants of health affecting pt's care:1} {Document your decision making why or why not admission, treatments were needed:1} Final Clinical Impression(s) / ED Diagnoses Final diagnoses:  None    Rx / DC Orders ED Discharge Orders  None

## 2021-10-13 MED ORDER — OXYCODONE-ACETAMINOPHEN 5-325 MG PO TABS
1.0000 | ORAL_TABLET | Freq: Once | ORAL | Status: AC
  Administered 2021-10-13: 1 via ORAL
  Filled 2021-10-13: qty 1

## 2021-10-13 NOTE — ED Notes (Signed)
[facility] called for transport.  

## 2021-10-13 NOTE — ED Notes (Signed)
I provided reinforced discharge education based off of after visit summary/care provided. Pt acknowledged and understood my education. Pt had no further questions/concerns for provider/myself. After visit summary provided to pt.

## 2021-10-13 NOTE — Discharge Instructions (Signed)
Stay hydrated.  In the mornings, stand up slowly.  Continue to follow-up with your outpatient care providers.  Return to the ED for any new or worsening symptoms of concern.

## 2021-11-02 ENCOUNTER — Other Ambulatory Visit: Payer: Self-pay

## 2021-11-02 ENCOUNTER — Telehealth: Payer: Self-pay | Admitting: Medical Oncology

## 2021-11-02 ENCOUNTER — Emergency Department (HOSPITAL_COMMUNITY): Payer: No Typology Code available for payment source

## 2021-11-02 ENCOUNTER — Inpatient Hospital Stay (HOSPITAL_COMMUNITY)
Admission: EM | Admit: 2021-11-02 | Discharge: 2021-11-04 | DRG: 190 | Disposition: A | Discharge status: Home / Self Care | Payer: No Typology Code available for payment source | Attending: Internal Medicine | Admitting: Internal Medicine

## 2021-11-02 ENCOUNTER — Encounter (HOSPITAL_COMMUNITY): Payer: Self-pay

## 2021-11-02 DIAGNOSIS — D72829 Elevated white blood cell count, unspecified: Secondary | ICD-10-CM | POA: Diagnosis present

## 2021-11-02 DIAGNOSIS — J441 Chronic obstructive pulmonary disease with (acute) exacerbation: Secondary | ICD-10-CM | POA: Diagnosis present

## 2021-11-02 DIAGNOSIS — Z9181 History of falling: Secondary | ICD-10-CM

## 2021-11-02 DIAGNOSIS — Z23 Encounter for immunization: Secondary | ICD-10-CM | POA: Diagnosis not present

## 2021-11-02 DIAGNOSIS — Z923 Personal history of irradiation: Secondary | ICD-10-CM | POA: Diagnosis not present

## 2021-11-02 DIAGNOSIS — G8929 Other chronic pain: Secondary | ICD-10-CM | POA: Diagnosis present

## 2021-11-02 DIAGNOSIS — Z79890 Hormone replacement therapy (postmenopausal): Secondary | ICD-10-CM

## 2021-11-02 DIAGNOSIS — Z79899 Other long term (current) drug therapy: Secondary | ICD-10-CM

## 2021-11-02 DIAGNOSIS — Z9981 Dependence on supplemental oxygen: Secondary | ICD-10-CM

## 2021-11-02 DIAGNOSIS — J439 Emphysema, unspecified: Principal | ICD-10-CM | POA: Diagnosis present

## 2021-11-02 DIAGNOSIS — D75839 Thrombocytosis, unspecified: Secondary | ICD-10-CM | POA: Diagnosis present

## 2021-11-02 DIAGNOSIS — Z87891 Personal history of nicotine dependence: Secondary | ICD-10-CM

## 2021-11-02 DIAGNOSIS — Z7984 Long term (current) use of oral hypoglycemic drugs: Secondary | ICD-10-CM

## 2021-11-02 DIAGNOSIS — N400 Enlarged prostate without lower urinary tract symptoms: Secondary | ICD-10-CM | POA: Diagnosis present

## 2021-11-02 DIAGNOSIS — I10 Essential (primary) hypertension: Secondary | ICD-10-CM | POA: Diagnosis present

## 2021-11-02 DIAGNOSIS — Z793 Long term (current) use of hormonal contraceptives: Secondary | ICD-10-CM

## 2021-11-02 DIAGNOSIS — E119 Type 2 diabetes mellitus without complications: Secondary | ICD-10-CM | POA: Diagnosis present

## 2021-11-02 DIAGNOSIS — D638 Anemia in other chronic diseases classified elsewhere: Secondary | ICD-10-CM | POA: Diagnosis present

## 2021-11-02 DIAGNOSIS — J9621 Acute and chronic respiratory failure with hypoxia: Secondary | ICD-10-CM | POA: Diagnosis present

## 2021-11-02 DIAGNOSIS — E669 Obesity, unspecified: Secondary | ICD-10-CM | POA: Diagnosis present

## 2021-11-02 DIAGNOSIS — Z7901 Long term (current) use of anticoagulants: Secondary | ICD-10-CM

## 2021-11-02 DIAGNOSIS — Z85118 Personal history of other malignant neoplasm of bronchus and lung: Secondary | ICD-10-CM | POA: Diagnosis not present

## 2021-11-02 DIAGNOSIS — H439 Unspecified disorder of vitreous body: Secondary | ICD-10-CM | POA: Diagnosis present

## 2021-11-02 DIAGNOSIS — G4733 Obstructive sleep apnea (adult) (pediatric): Secondary | ICD-10-CM | POA: Diagnosis present

## 2021-11-02 DIAGNOSIS — Z86711 Personal history of pulmonary embolism: Secondary | ICD-10-CM | POA: Diagnosis not present

## 2021-11-02 DIAGNOSIS — Z6838 Body mass index (BMI) 38.0-38.9, adult: Secondary | ICD-10-CM

## 2021-11-02 DIAGNOSIS — I252 Old myocardial infarction: Secondary | ICD-10-CM

## 2021-11-02 DIAGNOSIS — F419 Anxiety disorder, unspecified: Secondary | ICD-10-CM | POA: Diagnosis present

## 2021-11-02 DIAGNOSIS — I776 Arteritis, unspecified: Secondary | ICD-10-CM | POA: Diagnosis present

## 2021-11-02 DIAGNOSIS — I2510 Atherosclerotic heart disease of native coronary artery without angina pectoris: Secondary | ICD-10-CM | POA: Diagnosis present

## 2021-11-02 DIAGNOSIS — J90 Pleural effusion, not elsewhere classified: Secondary | ICD-10-CM | POA: Diagnosis present

## 2021-11-02 DIAGNOSIS — C3492 Malignant neoplasm of unspecified part of left bronchus or lung: Secondary | ICD-10-CM | POA: Diagnosis present

## 2021-11-02 DIAGNOSIS — J9611 Chronic respiratory failure with hypoxia: Secondary | ICD-10-CM

## 2021-11-02 DIAGNOSIS — Z85828 Personal history of other malignant neoplasm of skin: Secondary | ICD-10-CM

## 2021-11-02 DIAGNOSIS — F32A Depression, unspecified: Secondary | ICD-10-CM | POA: Diagnosis present

## 2021-11-02 LAB — CBC WITH DIFFERENTIAL/PLATELET
Abs Immature Granulocytes: 0.04 10*3/uL (ref 0.00–0.07)
Basophils Absolute: 0 10*3/uL (ref 0.0–0.1)
Basophils Relative: 0 %
Eosinophils Absolute: 0.2 10*3/uL (ref 0.0–0.5)
Eosinophils Relative: 1 %
HCT: 35.5 % — ABNORMAL LOW (ref 39.0–52.0)
Hemoglobin: 10.9 g/dL — ABNORMAL LOW (ref 13.0–17.0)
Immature Granulocytes: 0 %
Lymphocytes Relative: 13 %
Lymphs Abs: 1.5 10*3/uL (ref 0.7–4.0)
MCH: 25.7 pg — ABNORMAL LOW (ref 26.0–34.0)
MCHC: 30.7 g/dL (ref 30.0–36.0)
MCV: 83.7 fL (ref 80.0–100.0)
Monocytes Absolute: 0.9 10*3/uL (ref 0.1–1.0)
Monocytes Relative: 8 %
Neutro Abs: 9 10*3/uL — ABNORMAL HIGH (ref 1.7–7.7)
Neutrophils Relative %: 78 %
Platelets: 386 10*3/uL (ref 150–400)
RBC: 4.24 MIL/uL (ref 4.22–5.81)
RDW: 16.9 % — ABNORMAL HIGH (ref 11.5–15.5)
WBC: 11.7 10*3/uL — ABNORMAL HIGH (ref 4.0–10.5)
nRBC: 0 % (ref 0.0–0.2)

## 2021-11-02 LAB — COMPREHENSIVE METABOLIC PANEL
ALT: 13 U/L (ref 0–44)
AST: 16 U/L (ref 15–41)
Albumin: 3.6 g/dL (ref 3.5–5.0)
Alkaline Phosphatase: 84 U/L (ref 38–126)
Anion gap: 10 (ref 5–15)
BUN: 13 mg/dL (ref 8–23)
CO2: 27 mmol/L (ref 22–32)
Calcium: 9.2 mg/dL (ref 8.9–10.3)
Chloride: 103 mmol/L (ref 98–111)
Creatinine, Ser: 1.05 mg/dL (ref 0.61–1.24)
GFR, Estimated: >60 mL/min (ref >60)
Glucose, Bld: 203 mg/dL — ABNORMAL HIGH (ref 70–99)
Potassium: 3.9 mmol/L (ref 3.5–5.1)
Sodium: 140 mmol/L (ref 135–145)
Total Bilirubin: 0.3 mg/dL (ref 0.3–1.2)
Total Protein: 7.6 g/dL (ref 6.5–8.1)

## 2021-11-02 LAB — TROPONIN I (HIGH SENSITIVITY)
Troponin I (High Sensitivity): 5 ng/L (ref <18)
Troponin I (High Sensitivity): 5 ng/L (ref <18)

## 2021-11-02 LAB — BRAIN NATRIURETIC PEPTIDE: B Natriuretic Peptide: 21.8 pg/mL (ref 0.0–100.0)

## 2021-11-02 MED ORDER — IOHEXOL 350 MG/ML SOLN
75.0000 mL | Freq: Once | INTRAVENOUS | Status: AC | PRN
  Administered 2021-11-02: 75 mL via INTRAVENOUS

## 2021-11-02 MED ORDER — HYDROCODONE-ACETAMINOPHEN 5-325 MG PO TABS
1.0000 | ORAL_TABLET | Freq: Once | ORAL | Status: AC
  Administered 2021-11-02: 1 via ORAL
  Filled 2021-11-02: qty 1

## 2021-11-02 NOTE — Telephone Encounter (Signed)
Faxed [employer] to [facility] @ [facility].

## 2021-11-02 NOTE — ED Provider Notes (Signed)
[facility] DEPT Provider Note  CSN: [id number] Arrival date & time: 11/02/21 1847  Chief Complaint(s) No chief complaint on file.  HPI KHASIR WOODROME is a 71 y.o. male with PMH HTN, T2DM, COPD on 3 L home O2 non-small cell lung cancer with previous history of radiation therapy who presents emergency department for evaluation of shortness of breath.  Patient states that over the last week he has had progressive worsening shortness of breath on exertion and states that he gets out of breath quickly even walking to the bathroom.  He states that he had a fall approximately 1 month ago with multiple broken ribs but currently denies chest pain, abdominal pain, nausea, vomiting or other systemic symptoms.  Patient received DuoNebs prior to arrival by EMS and arrives on 5 L nasal cannula.   Past Medical History Past Medical History:  Diagnosis Date   Anxiety    Bronchitis    COPD (chronic obstructive pulmonary disease) ([place])    Depression    History of radiation therapy    Left lung- 10/05/20-10/15/20- Dr. Gery Pray   Hypertension    lung ca 09/2020   MI (myocardial infarction) [facility])    ????   OSA (obstructive sleep apnea)    Suicide attempt ([place])    Tension pneumothorax 06/27/2016   Uveitis    Patient Active Problem List   Diagnosis Date Noted   Acute and chronic respiratory failure with hypoxia ([place]) 11/02/2021   Adjustment disorder with depressed mood 10/09/2021   Acute pulmonary edema ([place]) 09/23/2021   BPH (benign prostatic hyperplasia) 09/23/2021   Pulmonary edema cardiac cause ([place]) 09/23/2021   Fracture of ribs, two, left, sequela 08/24/2021   Pleural effusion, left 08/20/2021   Metastatic carcinoma ([place]) 08/07/2021   Non-small cell lung cancer, left ([place]) 08/07/2021   Chronic respiratory failure with hypoxia ([place]) 08/07/2021   Rib pain 08/03/2021   Encounter for antineoplastic chemotherapy 07/20/2021   Leg paresthesia 06/17/2021    Obesity, Class III, BMI 40-49.9 (morbid obesity) ([place]) 06/17/2021   Pneumonia 06/17/2021   Hypertension    Allergic rhinitis    History of pulmonary embolism    Hyponatremia 05/20/2021   Mood disorder ([place]) 02/03/2021   Non-small cell cancer of left lung ([place]) 09/21/2020   Lung nodule 07/30/2020   Mass of left lung    Constipation    COPD with acute exacerbation ([place]) 01/05/2020   Vertigo 12/26/2019   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   Stage 3a chronic kidney disease ([place]) 12/26/2019   Abnormal CT of the chest 12/26/2019   COPD (chronic obstructive pulmonary disease) ([place])    Hyperglycemia 06/23/2019   OSA (obstructive sleep apnea) 06/23/2019   Suicidal ideation 06/23/2019   Major depressive disorder, recurrent episode ([place]) 10/12/2018   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 Authorizing Provider  albuterol (PROVENTIL) (2.5 MG/3ML) 0.083% nebulizer solution Inhale 3 mLs (2.5 mg total) by nebulization every 4 (four) hours as needed for wheezing or shortness of breath. Patient taking differently: Take  2.5 mg by nebulization every 6 (six) hours as needed for wheezing or shortness of breath. 08/04/20 10/02/22  Lavina Hamman, MD  albuterol (VENTOLIN HFA) 108 (90 Base) MCG/ACT inhaler Inhale 2 puffs into the lungs 4 (four) times daily.    [provider]  apixaban (ELIQUIS) 5 MG TABS tablet Take 5 mg by mouth 2 (two) times daily.    [provider]  buPROPion (WELLBUTRIN XL) 300 MG 24 hr tablet  Take 300 mg by mouth every morning. 02/05/20   [provider]  busPIRone (BUSPAR) 15 MG tablet Take 15 mg by mouth daily.    [provider]  Carboxymethylcellulose Sod PF 0.5 % SOLN Place 1 drop into both eyes 4 (four) times daily as needed (dry eyes).    [provider]  clobetasol ointment (TEMOVATE) 4.50 % Apply 1 Application topically 2 (two) times daily as needed (itching).    [provider]  cyclobenzaprine (FLEXERIL) 10 MG tablet Take 30 mg by mouth See admin instructions. 10 mg once daily for neck, headache, and muscular lower back pain + 30 mg at bedtime    [provider]  cycloSPORINE (RESTASIS) 0.05 % ophthalmic emulsion Place 1 drop into both eyes every 12 (twelve) hours.    [provider]  diltiazem (CARDIZEM CD) 180 MG 24 hr capsule Take 180 mg by mouth daily.    [provider]  docusate sodium (COLACE) 100 MG capsule Take 100 mg by mouth 2 (two) times daily.    [provider]  DULoxetine (CYMBALTA) 60 MG capsule Take 1 capsule (60 mg total) by mouth 2 (two) times daily. 10/04/21 11/03/21  Delfin Gant, NP  fluticasone (FLONASE) 50 MCG/ACT nasal spray Place 2 sprays into both nostrils daily.     [provider]  folic acid (FOLVITE) 1 MG tablet Take 1 mg by mouth daily.    [provider]  gabapentin (NEURONTIN) 300 MG capsule Take 3 capsules (900 mg total) by mouth at bedtime. 10/04/21 11/03/21  Delfin Gant, NP  guaiFENesin (MUCINEX) 600 MG 12 hr tablet Take 600 mg by mouth 2 (two) times daily as needed for cough.    [provider]  hydrOXYzine (ATARAX) 10 MG tablet Take 10 mg by mouth 2 (two) times daily as needed for anxiety.    [provider]  lisinopril (ZESTRIL) 20 MG tablet Take 20 mg by mouth daily.    [provider]  lurasidone (LATUDA) 40 MG TABS tablet Take 40 mg by mouth every evening. After supper    [provider]  Melatonin 3 MG  CAPS Take 6 mg by mouth at bedtime as needed (sleep).    [provider]  metFORMIN (GLUCOPHAGE) 850 MG tablet Take 850 mg by mouth in the morning and at bedtime.    [provider]  methocarbamol (ROBAXIN) 750 MG tablet Take 750 mg by mouth 2 (two) times daily as needed (muscle tension).    [provider]  methotrexate (RHEUMATREX) 2.5 MG tablet Take 15 mg by mouth once a week. Caution:Chemotherapy. Protect from light.    [provider]  mineral oil enema Place 1 enema rectally 2 (two) times daily as needed for severe constipation.    [provider]  Mometasone Furoate 200 MCG/ACT AERO Inhale 2 puffs into the lungs at bedtime as needed (shortness of breath/wheezing).    [provider]  mycophenolate (CELLCEPT) 250 MG capsule Take 1,500 mg by mouth 2 (two) times  daily.    [provider]  Nutritional Supplements (ENSURE ORIGINAL) LIQD Take 1 Bottle by mouth 2 (two) times daily.    [provider]  ondansetron (ZOFRAN-ODT) 4 MG disintegrating tablet Take 1 tablet (4 mg total) by mouth every 8 (eight) hours as needed for nausea or vomiting. 10/04/21   Delfin Gant, NP  oxcarbazepine (TRILEPTAL) 600 MG tablet Take 300-600 mg by mouth See admin instructions. 600 mg twice daily, 300 mg at bedtime    [provider]  oxyCODONE (OXY IR/ROXICODONE) 5 MG immediate release tablet Take 1-2 tablets (5-10 mg total) by mouth every 4 (four) hours as needed for severe pain. 7/56/43   Delora Fuel, MD  polyethylene glycol powder [facility]) 17 GM/SCOOP powder Take 17 g by mouth daily.    [provider]  prednisoLONE acetate (PRED FORTE) 1 % ophthalmic suspension Place 1 drop into both eyes See admin instructions. Instill 1 drop into left eye every hour and follow taper schedule for Pred Forte for the right eye    [provider]  predniSONE (DELTASONE) 50 MG tablet Take 50 mg by mouth daily with breakfast.  5 day course.    [provider]  tamsulosin (FLOMAX) 0.4 MG CAPS capsule Take 0.8 mg by mouth daily.    [provider]  Tiotropium Bromide-Olodaterol 2.5-2.5 MCG/ACT AERS Inhale 2 each into the lungs every morning. 2 puffs    [provider]  traZODone (DESYREL) 50 MG tablet Take 75 mg by mouth at bedtime.    [provider]                                                                                                                                    Past Surgical History Past Surgical History:  Procedure Laterality Date   BIOPSY  07/03/2021   Procedure: BIOPSY;  Surgeon: Otis Brace, MD;  Location: [facility];  Service: Gastroenterology;;   BRONCHIAL BIOPSY  07/30/2020   Procedure: BRONCHIAL BIOPSIES;  Surgeon: Garner Nash, DO;  Location: [facility];  Service: Pulmonary;;   BRONCHIAL BRUSHINGS  07/30/2020   Procedure: BRONCHIAL BRUSHINGS;  Surgeon: Garner Nash, DO;  Location: [facility];  Service: Pulmonary;;   BRONCHIAL NEEDLE ASPIRATION BIOPSY  07/30/2020   Procedure: BRONCHIAL NEEDLE ASPIRATION BIOPSIES;  Surgeon: Garner Nash, DO;  Location: [facility];  Service: Pulmonary;;   BRONCHIAL WASHINGS  07/30/2020   Procedure: BRONCHIAL WASHINGS;  Surgeon: Garner Nash, DO;  Location: [facility];  Service: Pulmonary;;   CHEST TUBE INSERTION Left 06/27/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     ESOPHAGOGASTRODUODENOSCOPY N/A 07/03/2021   Procedure: ESOPHAGOGASTRODUODENOSCOPY (EGD);  Surgeon: Otis Brace, MD;  Location: [name];  Service: Gastroenterology;  Laterality: N/A;   IR PERC PLEURAL DRAIN W/INDWELL CATH W/IMG GUIDE  08/04/2021   IR REMOVAL OF PLURAL CATH W/CUFF  09/01/2021   SKIN CANCER EXCISION     VIDEO BRONCHOSCOPY WITH  ENDOBRONCHIAL NAVIGATION Left 07/30/2020   Procedure: VIDEO BRONCHOSCOPY WITH ENDOBRONCHIAL NAVIGATION;  Surgeon: Garner Nash, DO;  Location: [facility];  Service: Pulmonary;  Laterality: Left;   Family  History Family History  Problem Relation Age of Onset   Dementia Father     Social History Social History   Tobacco Use   Smoking status: Former    Packs/day: 1.00    Years: 35.00    Total pack years: 35.00    Types: Cigarettes    Quit date: 05/2016    Years since quitting: 5.4   Smokeless tobacco: Never  Vaping Use   Vaping Use: Never used  Substance Use Topics   Alcohol use: No    Alcohol/week: 0.0 standard drinks of alcohol    Comment: denies use of any drugs or alcohol   Drug use: No   Allergies Demerol [meperidine], Zocor [simvastatin], Beet [beta vulgaris], and Liver  Review of Systems Review of Systems  Respiratory:  Positive for shortness of breath.     Physical Exam Vital Signs  I have reviewed the triage vital signs BP (!) 172/90   Pulse 92   Temp 98 F (36.7 C)   Resp (!) 21   Ht '6\' 2"'  (1.88 m)   Wt 135.2 kg   SpO2 98%   BMI 38.26 kg/m   Physical Exam Constitutional:      General: He is not in acute distress.    Appearance: Normal appearance.  HENT:     Head: Normocephalic and atraumatic.     Nose: No congestion or rhinorrhea.  Eyes:     General:        Right eye: No discharge.        Left eye: No discharge.     Extraocular Movements: Extraocular movements intact.     Pupils: Pupils are equal, round, and reactive to light.  Cardiovascular:     Rate and Rhythm: Normal rate and regular rhythm.     Heart sounds: No murmur heard. Pulmonary:     Effort: No respiratory distress.     Breath sounds: No wheezing or rales.  Abdominal:     General: There is no distension.     Tenderness: There is no abdominal tenderness.  Musculoskeletal:        General: Tenderness present. Normal range of motion.     Cervical back: Normal range of motion.  Skin:    General: Skin is warm and dry.  Neurological:     General: No focal deficit present.     Mental Status: He is alert.     ED Results and Treatments Labs (all labs ordered are listed, but  only abnormal results are displayed) Labs Reviewed  COMPREHENSIVE METABOLIC PANEL - Abnormal; Notable for the following components:      Result Value   Glucose, Bld 203 (*)    All other components within normal limits  CBC WITH DIFFERENTIAL/PLATELET - Abnormal; Notable for the following components:   WBC 11.7 (*)    Hemoglobin 10.9 (*)    HCT 35.5 (*)    MCH 25.7 (*)    RDW 16.9 (*)    Neutro Abs 9.0 (*)    All other components within normal limits  BRAIN NATRIURETIC PEPTIDE  BLOOD GAS, VENOUS  TROPONIN I (HIGH SENSITIVITY)  TROPONIN I (HIGH SENSITIVITY)  Radiology CT Angio Chest PE W and/or Wo Contrast  Result Date: 11/02/2021 CLINICAL DATA:  Pulmonary embolism (PE) suspected, high prob. Dyspnea, metastatic lung cancer. EXAM: CT ANGIOGRAPHY CHEST WITH CONTRAST TECHNIQUE: Multidetector CT imaging of the chest was performed using the standard protocol during bolus administration of intravenous contrast. Multiplanar CT image reconstructions and MIPs were obtained to evaluate the vascular anatomy. RADIATION DOSE REDUCTION: This exam was performed according to the departmental dose-optimization program which includes automated exposure control, adjustment of the mA and/or kV according to patient size and/or use of iterative reconstruction technique. CONTRAST:  79m OMNIPAQUE IOHEXOL 350 MG/ML SOLN COMPARISON:  09/30/2021 FINDINGS: Cardiovascular: There is adequate opacification of the pulmonary arterial tree. No intraluminal filling defect identified to suggest acute pulmonary embolism. Central pulmonary arteries are of normal caliber. Extensive multi-vessel coronary artery calcification. Global cardiac size is within normal limits. No pericardial effusion. Mild mixed atherosclerotic plaque within the thoracic aorta. No aortic aneurysm. Mediastinum/Nodes: No enlarged mediastinal,  hilar, or axillary lymph nodes. Thyroid gland, trachea, and esophagus demonstrate no significant findings. Lungs/Pleura: Severe emphysema. Left-sided volume loss is again identified. Partially loculated left pleural effusion is again seen and appears stable in size since prior examination. Associated pleural thickening is again identified, similar prior examination. Superimposed airspace infiltrate peripherally within the left lower lobe is unchanged. Nodular paramediastinal densities along left heart border have developed which may represent loculated pleural fluid or peripherally impacted airways given the relatively rapid development. Bullous changes again noted at the right lung base. No central obstructing mass. No pneumothorax. Upper Abdomen: No acute abnormality. Musculoskeletal: Subacute fractures of the left 8, 9 ribs is again identified adjacent to the complex pleural effusion no acute bone abnormality. Degenerative changes are seen within the thoracic spine. Review of the MIP images confirms the above findings. IMPRESSION: 1. No pulmonary embolism. 2. Extensive multi-vessel coronary artery calcification. 3. Severe emphysema. 4. Stable partially loculated left pleural effusion with associated pleural thickening. This may be posttraumatic in nature and represent an underlying hemothorax given the adjacent subacute fractures of the left eighth and ninth ribs, again noted. 5. Stable airspace infiltrate within the left lower lobe. This may present represent persistent or recurrent pneumonic infiltrate though additional considerations such as radiation pneumonitis in the appropriate clinical setting or pulmonary adenocarcinoma in this patient with a history of lung cancer could appear similarly. 6. Interval development of nodular paramediastinal densities along the left heart border which may represent loculated pleural fluid or peripherally impacted airways given the relatively rapid development. Emphysema  (ICD10-J43.9). Electronically Signed   By: AFidela SalisburyM.D.   On: 11/02/2021 21:36   DG Chest 2 View  Result Date: 11/02/2021 CLINICAL DATA:  Dyspnea. EXAM: CHEST - 2 VIEW COMPARISON:  October 12, 2021. FINDINGS: Stable cardiomediastinal silhouette. Stable left basilar atelectasis or infiltrate is noted with associated pleural effusion. Stable mild right basilar atelectasis or infiltrate is noted. Bony thorax is unremarkable. IMPRESSION: Stable bibasilar opacities as described above. Electronically Signed   By: JMarijo ConceptionM.D.   On: 11/02/2021 20:21    Pertinent labs & imaging results that were available during my care of the patient were reviewed by me and considered in my medical decision making (see MDM for details).  Medications Ordered in ED Medications  HYDROcodone-acetaminophen (NORCO/VICODIN) 5-325 MG per tablet 1 tablet (1 tablet Oral Given 11/02/21 2128)  iohexol (OMNIPAQUE) 350 MG/ML injection 75 mL (75 mLs Intravenous Contrast Given 11/02/21 2117)  Procedures .Critical Care  Performed by: Teressa Lower, MD Authorized by: Teressa Lower, MD   Critical care provider statement:    Critical care time (minutes):  30   Critical care was necessary to treat or prevent imminent or life-threatening deterioration of the following conditions:  Respiratory failure   Critical care was time spent personally by me on the following activities:  Development of treatment plan with patient or surrogate, discussions with consultants, evaluation of patient's response to treatment, examination of patient, ordering and review of laboratory studies, ordering and review of radiographic studies, ordering and performing treatments and interventions, pulse oximetry, re-evaluation of patient's condition and review of old charts   (including critical care time)  Medical  Decision Making / ED Course   This patient presents to the ED for concern of shortness of breath, this involves an extensive number of treatment options, and is a complaint that carries with it a high risk of complications and morbidity.  The differential diagnosis includes COPD exacerbation, pneumonia, PTX, pleural effusion, ACS, PE  MDM: Patient seen the emergency room for evaluation of shortness of breath and exertion.  Physical exam with some mild bilateral lower extremity pitting edema, patient states that this is not abnormal for him.  There is no wheezing on exam.  Laboratory evaluation with a mild leukocytosis to 11.7, hemoglobin 10.9 but is otherwise unremarkable.  High-sensitivity troponin and BNP are unremarkable.  Imaging including chest x-ray and CT PE showing extensive multivessel coronary artery calcification, severe emphysema, stable partially loculated left pleural effusion with possible underlying hemothorax and a stable airspace infiltrate that may represent pneumonia or pneumonitis.  Patient then admitted to the hospital service for consideration of diagnostic thoracentesis given the persistence of this pleural effusion and new oxygen requirement with worsening shortness of breath on exertion.   Additional history obtained:  -External records from outside source obtained and reviewed including: Chart review including previous notes, labs, imaging, consultation notes   Lab Tests: -I ordered, reviewed, and interpreted labs.   The pertinent results include:   Labs Reviewed  COMPREHENSIVE METABOLIC PANEL - Abnormal; Notable for the following components:      Result Value   Glucose, Bld 203 (*)    All other components within normal limits  CBC WITH DIFFERENTIAL/PLATELET - Abnormal; Notable for the following components:   WBC 11.7 (*)    Hemoglobin 10.9 (*)    HCT 35.5 (*)    MCH 25.7 (*)    RDW 16.9 (*)    Neutro Abs 9.0 (*)    All other components within normal limits   BRAIN NATRIURETIC PEPTIDE  BLOOD GAS, VENOUS  TROPONIN I (HIGH SENSITIVITY)  TROPONIN I (HIGH SENSITIVITY)      EKG   EKG Interpretation  Date/Time:  Tuesday November 02 2021 19:23:23 EDT Ventricular Rate:  100 PR Interval:    QRS Duration: 118 QT Interval:  368 QTC Calculation: 475 R Axis:   -20 Text Interpretation: Normal sinus rhythm Nonspecific intraventricular conduction delay Confirmed by Karnes ([id number]) on 11/02/2021 11:46:11 PM         Imaging Studies ordered: I ordered imaging studies including chest x-ray, CT PE I independently visualized and interpreted imaging. I agree with the radiologist interpretation   Medicines ordered and prescription drug management: Meds ordered this encounter  Medications   HYDROcodone-acetaminophen (NORCO/VICODIN) 5-325 MG per tablet 1 tablet   iohexol (OMNIPAQUE) 350 MG/ML injection 75 mL    -I have reviewed the patients home medicines and have  made adjustments as needed  Critical interventions Manuela Schwartz supplementation   Cardiac Monitoring: The patient was maintained on a cardiac monitor.  I personally viewed and interpreted the cardiac monitored which showed an underlying rhythm of: NSR  Social Determinants of Health:  Factors impacting patients care include: none   Reevaluation: After the interventions noted above, I reevaluated the patient and found that they have :stayed the same  Co morbidities that complicate the patient evaluation  Past Medical History:  Diagnosis Date   Anxiety    Bronchitis    COPD (chronic obstructive pulmonary disease) ([place])    Depression    History of radiation therapy    Left lung- 10/05/20-10/15/20- Dr. Gery Pray   Hypertension    lung ca 09/2020   MI (myocardial infarction) [facility])    ????   OSA (obstructive sleep apnea)    Suicide attempt [facility])    Tension pneumothorax 06/27/2016   Uveitis       Dispostion: I considered admission for this patient, and due to  progressive worsening oxygen requirement and shortness of breath patient require hospital admission.     Final Clinical Impression(s) / ED Diagnoses Final diagnoses:  None     '@PCDICTATION' @    Teressa Lower, MD 11/02/21 2348

## 2021-11-02 NOTE — ED Triage Notes (Signed)
Bibems sob since yester w/ hx stage 4 lung cancer. Worse w/ ambulation. Pt reports satting 87% on his baseline 2L home o2. Pt used his MDA 5x today. Albuterol and atrovent pta. Lung sounds clear. Currently satting 97% on 3L. Vss. [facility].

## 2021-11-03 ENCOUNTER — Encounter (HOSPITAL_COMMUNITY): Payer: Self-pay | Admitting: Family Medicine

## 2021-11-03 ENCOUNTER — Inpatient Hospital Stay (HOSPITAL_COMMUNITY): Payer: No Typology Code available for payment source

## 2021-11-03 DIAGNOSIS — F419 Anxiety disorder, unspecified: Secondary | ICD-10-CM | POA: Diagnosis present

## 2021-11-03 LAB — HEMOGLOBIN A1C
Hgb A1c MFr Bld: 7 % — ABNORMAL HIGH (ref 4.8–5.6)
Mean Plasma Glucose: 154.2 mg/dL

## 2021-11-03 LAB — GLUCOSE, CAPILLARY
Glucose-Capillary: 253 mg/dL — ABNORMAL HIGH (ref 70–99)
Glucose-Capillary: 256 mg/dL — ABNORMAL HIGH (ref 70–99)
Glucose-Capillary: 232 mg/dL — ABNORMAL HIGH (ref 70–99)
Glucose-Capillary: 282 mg/dL — ABNORMAL HIGH (ref 70–99)

## 2021-11-03 LAB — BASIC METABOLIC PANEL
Anion gap: 8 (ref 5–15)
BUN: 15 mg/dL (ref 8–23)
CO2: 26 mmol/L (ref 22–32)
Calcium: 9 mg/dL (ref 8.9–10.3)
Chloride: 105 mmol/L (ref 98–111)
Creatinine, Ser: 1.12 mg/dL (ref 0.61–1.24)
GFR, Estimated: >60 mL/min (ref >60)
Glucose, Bld: 178 mg/dL — ABNORMAL HIGH (ref 70–99)
Potassium: 5.1 mmol/L (ref 3.5–5.1)
Sodium: 139 mmol/L (ref 135–145)

## 2021-11-03 LAB — CBC
HCT: 34.6 % — ABNORMAL LOW (ref 39.0–52.0)
Hemoglobin: 10.7 g/dL — ABNORMAL LOW (ref 13.0–17.0)
MCH: 25.7 pg — ABNORMAL LOW (ref 26.0–34.0)
MCHC: 30.9 g/dL (ref 30.0–36.0)
MCV: 83.2 fL (ref 80.0–100.0)
Platelets: 430 10*3/uL — ABNORMAL HIGH (ref 150–400)
RBC: 4.16 MIL/uL — ABNORMAL LOW (ref 4.22–5.81)
RDW: 16.8 % — ABNORMAL HIGH (ref 11.5–15.5)
WBC: 10.9 10*3/uL — ABNORMAL HIGH (ref 4.0–10.5)
nRBC: 0 % (ref 0.0–0.2)

## 2021-11-03 LAB — BLOOD GAS, VENOUS
Acid-Base Excess: 4.6 mmol/L — ABNORMAL HIGH (ref 0.0–2.0)
Bicarbonate: 29.8 mmol/L — ABNORMAL HIGH (ref 20.0–28.0)
O2 Saturation: 60.4 %
Patient temperature: 37
pCO2, Ven: 46 mmHg (ref 44–60)
pH, Ven: 7.42 (ref 7.25–7.43)
pO2, Ven: 36 mmHg (ref 32–45)

## 2021-11-03 LAB — CBG MONITORING, ED: Glucose-Capillary: 105 mg/dL — ABNORMAL HIGH (ref 70–99)

## 2021-11-03 MED ORDER — GABAPENTIN 300 MG PO CAPS
300.0000 mg | ORAL_CAPSULE | Freq: Every day | ORAL | Status: DC
  Administered 2021-11-03 – 2021-11-04 (×2): 300 mg via ORAL
  Filled 2021-11-03 (×2): qty 1

## 2021-11-03 MED ORDER — TAMSULOSIN HCL 0.4 MG PO CAPS
0.8000 mg | ORAL_CAPSULE | Freq: Every day | ORAL | Status: DC
  Administered 2021-11-03 – 2021-11-04 (×2): 0.8 mg via ORAL
  Filled 2021-11-03 (×2): qty 2

## 2021-11-03 MED ORDER — INFLUENZA VAC A&B SA ADJ QUAD 0.5 ML IM PRSY
0.5000 mL | PREFILLED_SYRINGE | INTRAMUSCULAR | Status: AC
Start: 2021-11-04 — End: 2021-11-04
  Administered 2021-11-04: 0.5 mL via INTRAMUSCULAR
  Filled 2021-11-03: qty 0.5

## 2021-11-03 MED ORDER — POLYETHYLENE GLYCOL 3350 17 GM/SCOOP PO POWD
17.0000 g | Freq: Every day | ORAL | Status: DC
  Filled 2021-11-03: qty 255

## 2021-11-03 MED ORDER — OXYCODONE HCL 5 MG PO TABS
5.0000 mg | ORAL_TABLET | ORAL | Status: DC | PRN
  Administered 2021-11-03 (×2): 5 mg via ORAL
  Filled 2021-11-03 (×2): qty 1

## 2021-11-03 MED ORDER — CYCLOSPORINE 0.05 % OP EMUL
1.0000 [drp] | Freq: Two times a day (BID) | OPHTHALMIC | Status: DC
  Administered 2021-11-03 – 2021-11-04 (×4): 1 [drp] via OPHTHALMIC
  Filled 2021-11-03 (×5): qty 30

## 2021-11-03 MED ORDER — DULOXETINE HCL 60 MG PO CPEP
60.0000 mg | ORAL_CAPSULE | Freq: Two times a day (BID) | ORAL | Status: DC
  Administered 2021-11-03 – 2021-11-04 (×4): 60 mg via ORAL
  Filled 2021-11-03: qty 1
  Filled 2021-11-03: qty 2
  Filled 2021-11-03 (×2): qty 1

## 2021-11-03 MED ORDER — INSULIN ASPART 100 UNIT/ML IJ SOLN
0.0000 [IU] | Freq: Every day | INTRAMUSCULAR | Status: DC
  Administered 2021-11-03: 3 [IU] via SUBCUTANEOUS
  Filled 2021-11-03: qty 0.05

## 2021-11-03 MED ORDER — APIXABAN 5 MG PO TABS
5.0000 mg | ORAL_TABLET | Freq: Two times a day (BID) | ORAL | Status: DC
  Administered 2021-11-03 – 2021-11-04 (×4): 5 mg via ORAL
  Filled 2021-11-03 (×4): qty 1

## 2021-11-03 MED ORDER — METHYLPREDNISOLONE SODIUM SUCC 125 MG IJ SOLR
125.0000 mg | Freq: Two times a day (BID) | INTRAMUSCULAR | Status: DC
  Administered 2021-11-03: 125 mg via INTRAVENOUS
  Filled 2021-11-03: qty 2

## 2021-11-03 MED ORDER — IPRATROPIUM-ALBUTEROL 0.5-2.5 (3) MG/3ML IN SOLN
3.0000 mL | Freq: Three times a day (TID) | RESPIRATORY_TRACT | Status: DC
  Administered 2021-11-03: 3 mL via RESPIRATORY_TRACT
  Filled 2021-11-03: qty 3

## 2021-11-03 MED ORDER — TRAZODONE HCL 50 MG PO TABS
75.0000 mg | ORAL_TABLET | Freq: Every day | ORAL | Status: DC
  Administered 2021-11-03 (×2): 75 mg via ORAL
  Filled 2021-11-03 (×2): qty 2

## 2021-11-03 MED ORDER — POLYETHYLENE GLYCOL 3350 17 G PO PACK
17.0000 g | PACK | Freq: Every day | ORAL | Status: DC
  Administered 2021-11-03 – 2021-11-04 (×2): 17 g via ORAL
  Filled 2021-11-03 (×3): qty 1

## 2021-11-03 MED ORDER — SODIUM CHLORIDE 0.9 % IV SOLN
2.0000 g | INTRAVENOUS | Status: DC
  Administered 2021-11-03: 2 g via INTRAVENOUS
  Filled 2021-11-03: qty 20

## 2021-11-03 MED ORDER — POLYVINYL ALCOHOL 1.4 % OP SOLN: 1.0000 [drp] | Freq: Four times a day (QID) | OPHTHALMIC | Status: DC | PRN

## 2021-11-03 MED ORDER — DOCUSATE SODIUM 100 MG PO CAPS
100.0000 mg | ORAL_CAPSULE | Freq: Two times a day (BID) | ORAL | Status: DC
  Administered 2021-11-03 (×2): 100 mg via ORAL
  Filled 2021-11-03 (×2): qty 1

## 2021-11-03 MED ORDER — GABAPENTIN 300 MG PO CAPS
900.0000 mg | ORAL_CAPSULE | Freq: Every day | ORAL | Status: DC
  Administered 2021-11-03 (×2): 900 mg via ORAL
  Filled 2021-11-03 (×2): qty 3

## 2021-11-03 MED ORDER — MYCOPHENOLATE MOFETIL 250 MG PO CAPS
1500.0000 mg | ORAL_CAPSULE | Freq: Two times a day (BID) | ORAL | Status: DC
  Administered 2021-11-03 – 2021-11-04 (×4): 1500 mg via ORAL
  Filled 2021-11-03 (×5): qty 6

## 2021-11-03 MED ORDER — PREDNISONE 20 MG PO TABS: 40.0000 mg | ORAL_TABLET | Freq: Every day | ORAL | Status: DC

## 2021-11-03 MED ORDER — LURASIDONE HCL 40 MG PO TABS
40.0000 mg | ORAL_TABLET | Freq: Every evening | ORAL | Status: DC
  Administered 2021-11-03: 40 mg via ORAL
  Filled 2021-11-03: qty 1

## 2021-11-03 MED ORDER — IPRATROPIUM-ALBUTEROL 0.5-2.5 (3) MG/3ML IN SOLN
3.0000 mL | Freq: Three times a day (TID) | RESPIRATORY_TRACT | Status: DC
  Administered 2021-11-04: 3 mL via RESPIRATORY_TRACT
  Filled 2021-11-03: qty 3

## 2021-11-03 MED ORDER — IPRATROPIUM-ALBUTEROL 0.5-2.5 (3) MG/3ML IN SOLN
3.0000 mL | Freq: Four times a day (QID) | RESPIRATORY_TRACT | Status: DC
  Administered 2021-11-03: 3 mL via RESPIRATORY_TRACT
  Filled 2021-11-03 (×2): qty 3

## 2021-11-03 MED ORDER — SENNOSIDES-DOCUSATE SODIUM 8.6-50 MG PO TABS
1.0000 | ORAL_TABLET | Freq: Two times a day (BID) | ORAL | Status: DC
  Administered 2021-11-03 – 2021-11-04 (×2): 1 via ORAL
  Filled 2021-11-03 (×2): qty 1

## 2021-11-03 MED ORDER — METHYLPREDNISOLONE SODIUM SUCC 125 MG IJ SOLR
80.0000 mg | Freq: Two times a day (BID) | INTRAMUSCULAR | Status: DC
  Administered 2021-11-03 (×2): 80 mg via INTRAVENOUS
  Filled 2021-11-03 (×2): qty 2

## 2021-11-03 MED ORDER — SODIUM CHLORIDE 0.9% FLUSH
3.0000 mL | Freq: Two times a day (BID) | INTRAVENOUS | Status: DC
  Administered 2021-11-03 (×3): 3 mL via INTRAVENOUS

## 2021-11-03 MED ORDER — HYDROXYZINE HCL 10 MG PO TABS: 10.0000 mg | ORAL_TABLET | Freq: Two times a day (BID) | ORAL | Status: DC | PRN

## 2021-11-03 MED ORDER — SODIUM CHLORIDE 0.9% FLUSH
3.0000 mL | INTRAVENOUS | Status: DC | PRN
Start: 2021-11-03 — End: 2021-11-04

## 2021-11-03 MED ORDER — CLOBETASOL PROPIONATE 0.05 % EX OINT
1.0000 | TOPICAL_OINTMENT | Freq: Two times a day (BID) | CUTANEOUS | Status: DC | PRN
  Filled 2021-11-03: qty 15

## 2021-11-03 MED ORDER — LISINOPRIL 20 MG PO TABS
20.0000 mg | ORAL_TABLET | Freq: Every day | ORAL | Status: DC
  Administered 2021-11-03 – 2021-11-04 (×2): 20 mg via ORAL
  Filled 2021-11-03 (×2): qty 1

## 2021-11-03 MED ORDER — IPRATROPIUM-ALBUTEROL 0.5-2.5 (3) MG/3ML IN SOLN: 3.0000 mL | Freq: Two times a day (BID) | RESPIRATORY_TRACT | Status: DC

## 2021-11-03 MED ORDER — ACETAMINOPHEN 650 MG RE SUPP: 650.0000 mg | Freq: Four times a day (QID) | RECTAL | Status: DC | PRN

## 2021-11-03 MED ORDER — BUPROPION HCL ER (XL) 300 MG PO TB24
300.0000 mg | ORAL_TABLET | Freq: Every morning | ORAL | Status: DC
  Administered 2021-11-03 – 2021-11-04 (×2): 300 mg via ORAL
  Filled 2021-11-03 (×2): qty 1

## 2021-11-03 MED ORDER — INSULIN ASPART 100 UNIT/ML IJ SOLN
0.0000 [IU] | Freq: Three times a day (TID) | INTRAMUSCULAR | Status: DC
  Administered 2021-11-03: 3 [IU] via SUBCUTANEOUS
  Administered 2021-11-03: 2 [IU] via SUBCUTANEOUS
  Administered 2021-11-03 – 2021-11-04 (×3): 3 [IU] via SUBCUTANEOUS
  Filled 2021-11-03: qty 0.06

## 2021-11-03 MED ORDER — SODIUM CHLORIDE 0.9 % IV SOLN
1.0000 g | INTRAVENOUS | Status: DC
  Administered 2021-11-03: 1 g via INTRAVENOUS
  Filled 2021-11-03: qty 10

## 2021-11-03 MED ORDER — BUSPIRONE HCL 5 MG PO TABS
15.0000 mg | ORAL_TABLET | Freq: Every day | ORAL | Status: DC
  Administered 2021-11-03 – 2021-11-04 (×2): 15 mg via ORAL
  Filled 2021-11-03 (×2): qty 3

## 2021-11-03 MED ORDER — SODIUM CHLORIDE 0.9% FLUSH
3.0000 mL | Freq: Two times a day (BID) | INTRAVENOUS | Status: DC
  Administered 2021-11-03 – 2021-11-04 (×2): 3 mL via INTRAVENOUS

## 2021-11-03 MED ORDER — SODIUM CHLORIDE 0.9 % IV SOLN
250.0000 mL | INTRAVENOUS | Status: DC | PRN
Start: 2021-11-03 — End: 2021-11-04

## 2021-11-03 MED ORDER — BISACODYL 10 MG RE SUPP: 10.0000 mg | Freq: Every day | RECTAL | Status: DC | PRN

## 2021-11-03 MED ORDER — CYCLOBENZAPRINE HCL 10 MG PO TABS
10.0000 mg | ORAL_TABLET | Freq: Once | ORAL | Status: AC
  Administered 2021-11-03: 10 mg via ORAL
  Filled 2021-11-03: qty 1

## 2021-11-03 MED ORDER — BUDESONIDE 0.5 MG/2ML IN SUSP
0.5000 mg | Freq: Two times a day (BID) | RESPIRATORY_TRACT | Status: DC
  Administered 2021-11-03 – 2021-11-04 (×2): 0.5 mg via RESPIRATORY_TRACT
  Filled 2021-11-03 (×3): qty 2

## 2021-11-03 MED ORDER — OXCARBAZEPINE 300 MG PO TABS
300.0000 mg | ORAL_TABLET | Freq: Every day | ORAL | Status: DC
  Administered 2021-11-03 (×2): 300 mg via ORAL
  Filled 2021-11-03 (×2): qty 1

## 2021-11-03 MED ORDER — ACETAMINOPHEN 325 MG PO TABS
650.0000 mg | ORAL_TABLET | Freq: Four times a day (QID) | ORAL | Status: DC | PRN
  Administered 2021-11-03: 650 mg via ORAL
  Filled 2021-11-03: qty 2

## 2021-11-03 MED ORDER — BISACODYL 5 MG PO TBEC
10.0000 mg | DELAYED_RELEASE_TABLET | Freq: Once | ORAL | Status: AC
  Administered 2021-11-03: 10 mg via ORAL
  Filled 2021-11-03: qty 2

## 2021-11-03 MED ORDER — ALBUTEROL SULFATE (2.5 MG/3ML) 0.083% IN NEBU: 2.5000 mg | INHALATION_SOLUTION | RESPIRATORY_TRACT | Status: DC | PRN

## 2021-11-03 MED ORDER — DILTIAZEM HCL ER COATED BEADS 180 MG PO CP24
180.0000 mg | ORAL_CAPSULE | Freq: Every day | ORAL | Status: DC
  Administered 2021-11-03 – 2021-11-04 (×2): 180 mg via ORAL
  Filled 2021-11-03 (×3): qty 1

## 2021-11-03 NOTE — Evaluation (Signed)
Physical Therapy Evaluation Patient Details Name: Mario [name] MRN: [id number] DOB: 1950/05/31 Today's Date: 11/03/2021  History of Present Illness  Pt is a 71 y.o. male presenting with increased shortness of breath and increased sputum production. PMH significant for non-small cell lung cancer status post SBRT, malignant pleural effusion status post left Pleurx catheter (subsequently removed on 09/01/2021), vasculitis and ankylosing spondylitis, PE on Eliquis, OSA with CPAP intolerance, type 2 diabetes mellitus, chronic pain, depression, anxiety, and COPD with chronic hypoxic respiratory failure .   Clinical Impression  Pt is a 71 y.o. male with above HPI resulting in the deficits listed below (see PT Problem List). Pt performed sit to stand transfers and ambulation with close supervision and performed stair negotiation with MIN guard. Pt ambulated total of ~181ft with x1 standing rest break due to fatigue/SOB.  O2 desat to 87% on 3L and HR up to 127bpm with mobility requiring titration up to 4L and increased time and seated rest to recover to 94%. Able to wean back to baseline 3L and O2 sats 94%, HR 110bpm. Pt will have intermittent assist from one of his roommates and plans to continue with [facility] aide assistance upon d/c. Pt will benefit from skilled PT to maximize functional mobility to increase independence.         Recommendations for follow up therapy are one component of a multi-disciplinary discharge planning process, led by the attending physician.  Recommendations may be updated based on patient status, additional functional criteria and insurance authorization.  Follow Up Recommendations Home health PT      Assistance Recommended at Discharge Intermittent Supervision/Assistance  Patient can return home with the following  A little help with walking and/or transfers;A little help with bathing/dressing/bathroom;Assistance with cooking/housework;Help with stairs or ramp for entrance     Equipment Recommendations None recommended by PT  Recommendations for Other Services       Functional Status Assessment Patient has had a recent decline in their functional status and demonstrates the ability to make significant improvements in function in a reasonable and predictable amount of time.     Precautions / Restrictions Precautions Precautions: Fall Precaution Comments: monitor O2 Restrictions Weight Bearing Restrictions: No      Mobility  Bed Mobility               General bed mobility comments: sitting EOB upon entry, requesting to remain EOB at end of session    Transfers Overall transfer level: Modified independent Equipment used: Straight cane                    Ambulation/Gait Ambulation/Gait assistance: Supervision Gait Distance (Feet): 50 Feet Assistive device: Straight cane Gait Pattern/deviations: Step-through pattern, Decreased stride length Gait velocity: decreased     General Gait Details: additional 6ft following stair training and standing rest break due to SOB, increased fatigue, no overt LOB observed. O2 desat to 87% on 3L and HR up to 127bpm with mobility required titration up to 4L and increased time and seated rest to recover to 94%. Able to wean back to baseline 3L and O2 sats 94%, HR 110bpm.  Stairs Stairs: Yes Stairs assistance: Min guard Stair Management: One rail Left, Forwards, Step to pattern, With cane Number of Stairs: 3 General stair comments: MIN guard for safety, x1 mild LOB- pt able to correct  Wheelchair Mobility    Modified Rankin (Stroke Patients Only)       Balance Overall balance assessment: Mild deficits observed, not formally tested  Pertinent Vitals/Pain Pain Assessment Pain Assessment: 0-10 Pain Score: 2  Pain Location: posterior cervical region Pain Descriptors / Indicators: Discomfort Pain Intervention(s): Monitored during  session    Home Living Family/patient expects to be discharged to:: Private residence Living Arrangements: Non-relatives/Friends Available Help at Discharge: Friend(s) Type of Home: House Home Access: Level entry     Alternate Level Stairs-Number of Steps: 5 Home Layout: Two level Home Equipment: Cane - single point;Shower Land (2 wheels);Rollator (4 wheels) Additional Comments: 1 roommate that assists PRN. goes up/down 5 steps to kitchen/living room area. bedroom on 2nd level of 5steps. Had [facility] aide just 1 time prior to admission who was there for 1 day- 9 hrs helped with laundry. Pt reports he likes to have someone supervise when showering for safety. wears 3L O2 at baseline, sometimes goes up to 4-4.5L.    Prior Function Prior Level of Function : Independent/Modified Independent             Mobility Comments: no AD use.       Hand Dominance        Extremity/Trunk Assessment   Upper Extremity Assessment Upper Extremity Assessment: Overall WFL for tasks assessed    Lower Extremity Assessment Lower Extremity Assessment: Generalized weakness    Cervical / Trunk Assessment Cervical / Trunk Assessment: Normal  Communication   Communication: No difficulties  Cognition Arousal/Alertness: Awake/alert Behavior During Therapy: WFL for tasks assessed/performed Overall Cognitive Status: Within Functional Limits for tasks assessed                                          General Comments      Exercises     Assessment/Plan    PT Assessment Patient needs continued PT services  PT Problem List Decreased strength;Decreased activity tolerance;Decreased balance;Decreased mobility;Cardiopulmonary status limiting activity       PT Treatment Interventions DME instruction;Stair training;Functional mobility training;Gait training;Therapeutic activities;Therapeutic exercise;Balance training;Patient/family education    PT Goals (Current goals  can be found in the Care Plan section)  Acute Rehab PT Goals Patient Stated Goal: feel better and keep as much independence as possible PT Goal Formulation: With patient Time For Goal Achievement: 11/17/21 Potential to Achieve Goals: Good    Frequency Min 3X/week     Co-evaluation               AM-PAC PT "6 Clicks" Mobility  Outcome Measure Help needed turning from your back to your side while in a flat bed without using bedrails?: A Little Help needed moving from lying on your back to sitting on the side of a flat bed without using bedrails?: A Little Help needed moving to and from a bed to a chair (including a wheelchair)?: A Little Help needed standing up from a chair using your arms (e.g., wheelchair or bedside chair)?: A Little Help needed to walk in hospital room?: A Little Help needed climbing 3-5 steps with a railing? : A Little 6 Click Score: 18    End of Session Equipment Utilized During Treatment: Gait belt;Oxygen Activity Tolerance: Patient tolerated treatment well Patient left: in bed;with call bell/phone within reach Nurse Communication: Mobility status (O2 sats with mobility) PT Visit Diagnosis: Unsteadiness on feet (R26.81);Muscle weakness (generalized) (M62.81)    Time: 9767-3419 PT Time Calculation (min) (ACUTE ONLY): 19 min   Charges:   PT Evaluation $PT Eval Low Complexity: 1 Low  Festus Barren PT, DPT  Acute Rehabilitation Services  Office [phone]  11/03/2021, 3:54 PM

## 2021-11-03 NOTE — Progress Notes (Signed)
Charge RN made aware of CSSR scale score for patient. CN assessed patient and patient currently denies thought of suicide or self harm.

## 2021-11-03 NOTE — Progress Notes (Signed)
Patient admitted early this morning for shortness of breath and has been started on steroids and breathing treatments.  Patient seen and examined at bedside and plan of care discussed with him.  I will request IR consultation to see if left thoracentesis can be performed.  Reviewed patient's medical records including this morning's H&P, current vitals and medications myself.  Consult palliative care for goals of care discussion.

## 2021-11-03 NOTE — TOC Initial Note (Signed)
Transition of Care [facility]) - Initial/Assessment Note    Patient Details  Name: Mario [name] MRN: [id number] Date of Birth: 01/07/1951  [facility] CM/SW Contact:    Henrietta Dine, RN Phone Number: 11/03/2021, 11:37 AM  Clinical Narrative: from boarding house,  plan to d/c home; will PTAR for transportation; has home O2 and states has travel tank and cane; has VA benefits; left VM for April Alexander, [place] Transfer Coordinator [phone], ext [phone]; continue to monitor.         Expected Discharge Plan: Home/Self Care (boarding house) Barriers to Discharge: Continued Medical Work up   Patient Goals and CMS Choice Patient states their goals for this hospitalization and ongoing recovery are:: home CMS Medicare.gov Compare Post Acute Care list provided to:: Patient Choice offered to / list presented to : Patient  Expected Discharge Plan and Services Expected Discharge Plan: Home/Self Care (boarding house)   Discharge Planning Services: CM Consult Post Acute Care Choice: NA Living arrangements for the past 2 months: [place]                                      Prior Living Arrangements/Services Living arrangements for the past 2 months: [place] with:: Self Patient language and need for interpreter reviewed:: Yes Do you feel safe going back to the place where you live?: Yes      Need for Family Participation in Patient Care: Yes (Comment) Care giver support system in place?: Yes (comment) Current home services: DME ([facility] [employer] home O2; has 3 hr travel tank; cane, private duty care Paediatric nurse x 9 hours/week)) Criminal Activity/Legal Involvement Pertinent to Current Situation/Hospitalization: No - Comment as needed  Activities of Daily Living Home Assistive Devices/Equipment: Oxygen ADL Screening (condition at time of admission) Patient's cognitive ability adequate to safely complete daily activities?: Yes Is the  patient deaf or have difficulty hearing?: No Does the patient have difficulty seeing, even when wearing glasses/contacts?: No Does the patient have difficulty concentrating, remembering, or making decisions?: No Patient able to express need for assistance with ADLs?: Yes Does the patient have difficulty dressing or bathing?: No Independently performs ADLs?: Yes (appropriate for developmental age) Does the patient have difficulty walking or climbing stairs?: Yes Weakness of Legs: None Weakness of Arms/Hands: None  Permission Sought/Granted Permission sought to share information with : Case Manager Permission granted to share information with : Yes, Verbal Permission Granted  Share Information with NAME: Case Manager           Emotional Assessment Appearance:: Appears stated age Attitude/Demeanor/Rapport: Gracious Affect (typically observed): Accepting Orientation: : Oriented to Self, Oriented to Place, Oriented to  Time, Oriented to Situation Alcohol / Substance Use: Not Applicable Psych Involvement: No (comment)  Admission diagnosis:  Acute and chronic respiratory failure with hypoxia (HCC) [J96.21] Patient Active Problem List   Diagnosis Date Noted   Anxiety    Acute and chronic respiratory failure with hypoxia ([place]) 11/02/2021   Adjustment disorder with depressed mood 10/09/2021   Acute pulmonary edema ([place]) 09/23/2021   BPH (benign prostatic hyperplasia) 09/23/2021   Fracture of ribs, two, left, sequela 08/24/2021   Pleural effusion, left 08/20/2021   Metastatic carcinoma ([place]) 08/07/2021   Non-small cell lung cancer, left ([place]) 08/07/2021   Chronic respiratory failure with hypoxia ([place]) 08/07/2021   Rib pain 08/03/2021   Encounter for antineoplastic chemotherapy 07/20/2021   Leg  paresthesia 06/17/2021   Obesity, Class III, BMI 40-49.9 (morbid obesity) ([place]) 06/17/2021   Pneumonia 06/17/2021   Hypertension    Allergic rhinitis    History of pulmonary embolism     Hyponatremia 05/20/2021   Mood disorder ([place]) 02/03/2021   Non-small cell cancer of left lung ([place]) 09/21/2020   Lung nodule 07/30/2020   Mass of left lung    Constipation    COPD with acute exacerbation ([place]) 01/05/2020   Vertigo 12/26/2019   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   Stage 3a chronic kidney disease ([place]) 12/26/2019   Abnormal CT of the chest 12/26/2019   COPD (chronic obstructive pulmonary disease) (HCC)    Hyperglycemia 06/23/2019   OSA (obstructive sleep apnea) 06/23/2019   Major depressive disorder, recurrent episode ([place]) 10/12/2018   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 [place] [address] Phone: [phone] Fax: [phone]     Social Determinants of Health (SDOH) Interventions Housing Interventions: Intervention Not Indicated Transportation Interventions: Intervention Not Indicated Utilities Interventions: Intervention Not Indicated  Readmission Risk Interventions    08/23/2021   11:48 AM 08/05/2021   11:02 AM 06/07/2021    4:15 PM  Readmission Risk Prevention Plan  Transportation Screening Complete Complete Complete  Medication Review (RN Care Manager) Complete Complete Complete  PCP or Specialist appointment within 3-5 days of discharge Complete Complete Complete  HRI or Home Care Consult Complete Complete Complete  SW Recovery Care/Counseling Consult Complete Complete Complete  Palliative Care Screening  Not Applicable Not Applicable Not [place] Not Applicable Not Applicable Not Applicable

## 2021-11-03 NOTE — H&P (Signed)
History and Physical    MESSIAH AHR UDJ:[id number] DOB: April 11, 1950 DOA: 11/02/2021  PCP: [facility]   Patient coming from: Home   Chief Complaint: SOB   HPI: Mario [name] is a pleasant 71 y.o. male with medical history significant for non-small cell lung cancer status post SBRT, malignant pleural effusion status post left Pleurx catheter (subsequently removed on 09/01/2021), vasculitis and ankylosing spondylitis, PE on Eliquis, OSA with CPAP intolerance, type 2 diabetes mellitus, chronic pain, depression, anxiety, and COPD with chronic hypoxic respiratory failure who now presents with increased shortness of breath and increased sputum production.  Patient reports that he recently moved and has 5 stairs leading to his bedroom.  He had been able to negotiate the stairs without significant dyspnea up until the last few days and particularly the last 1 day.  Over the last few days, he has become progressively more short of breath and has had increased sputum production.  He denies chest pain, fever, or chills.  He is now unable to go up or down the 5 steps in his home due to dyspnea.  He was treated with DuoNeb prior to arrival in the ED and reports some improvement with this.  ED Course: Upon arrival to the ED, patient is found to be afebrile and saturating low 90s on 3 L/min of supplemental oxygen while at rest.  CTA chest is negative for PE but notable for severe emphysema, stable left pleural effusion, and stable airspace infiltrates.  Patient was treated with Norco in the ED.  Review of Systems:  All other systems reviewed and apart from HPI, are negative.  Past Medical History:  Diagnosis Date   Anxiety    Bronchitis    COPD (chronic obstructive pulmonary disease) ([place])    Depression    History of radiation therapy    Left lung- 10/05/20-10/15/20- Dr. Gery Pray   Hypertension    lung ca 09/2020   MI (myocardial infarction) [facility])    ????   OSA (obstructive  sleep apnea)    Suicide attempt ([place])    Tension pneumothorax 06/27/2016   Uveitis     Past Surgical History:  Procedure Laterality Date   BIOPSY  07/03/2021   Procedure: BIOPSY;  Surgeon: Otis Brace, MD;  Location: [facility];  Service: Gastroenterology;;   BRONCHIAL BIOPSY  07/30/2020   Procedure: BRONCHIAL BIOPSIES;  Surgeon: Garner Nash, DO;  Location: [facility];  Service: Pulmonary;;   BRONCHIAL BRUSHINGS  07/30/2020   Procedure: BRONCHIAL BRUSHINGS;  Surgeon: Garner Nash, DO;  Location: [facility];  Service: Pulmonary;;   BRONCHIAL NEEDLE ASPIRATION BIOPSY  07/30/2020   Procedure: BRONCHIAL NEEDLE ASPIRATION BIOPSIES;  Surgeon: Garner Nash, DO;  Location: [place];  Service: Pulmonary;;   BRONCHIAL WASHINGS  07/30/2020   Procedure: BRONCHIAL WASHINGS;  Surgeon: Garner Nash, DO;  Location: [place];  Service: Pulmonary;;   CHEST TUBE INSERTION Left 06/27/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     ESOPHAGOGASTRODUODENOSCOPY N/A 07/03/2021   Procedure: ESOPHAGOGASTRODUODENOSCOPY (EGD);  Surgeon: Otis Brace, MD;  Location: [name];  Service: Gastroenterology;  Laterality: N/A;   IR PERC PLEURAL DRAIN W/INDWELL CATH W/IMG GUIDE  08/04/2021   IR REMOVAL OF PLURAL CATH W/CUFF  09/01/2021   SKIN CANCER EXCISION     VIDEO BRONCHOSCOPY WITH ENDOBRONCHIAL NAVIGATION Left 07/30/2020   Procedure: VIDEO BRONCHOSCOPY WITH ENDOBRONCHIAL NAVIGATION;  Surgeon: Garner Nash, DO;  Location: [facility];  Service: Pulmonary;  Laterality: Left;    Social History:  reports that he quit smoking about 5 years ago. His smoking use included cigarettes. He has a 35.00 pack-year smoking history. He has never used smokeless tobacco. He reports that he does not drink alcohol and does not use drugs.  Allergies  Allergen Reactions   Demerol [Meperidine] Nausea And Vomiting and Other (See Comments)    Made the patient "violently sick"   Zocor [Simvastatin] Nausea And Vomiting and  Other (See Comments)    Made him very jittery, also   Beet [Beta Vulgaris] Nausea And Vomiting   Liver Nausea And Vomiting    Family History  Problem Relation Age of Onset   Dementia Father      Prior to Admission medications   Medication Sig Start Date End Date Taking? Authorizing Provider  albuterol (PROVENTIL) (2.5 MG/3ML) 0.083% nebulizer solution Inhale 3 mLs (2.5 mg total) by nebulization every 4 (four) hours as needed for wheezing or shortness of breath. Patient taking differently: Take 2.5 mg by nebulization every 6 (six) hours as needed for wheezing or shortness of breath. 08/04/20 10/02/22 Yes Lavina Hamman, MD  albuterol (VENTOLIN HFA) 108 (90 Base) MCG/ACT inhaler Inhale 2 puffs into the lungs 4 (four) times daily.   Yes [provider]  apixaban (ELIQUIS) 5 MG TABS tablet Take 5 mg by mouth 2 (two) times daily.   Yes [provider]  buPROPion (WELLBUTRIN XL) 300 MG 24 hr tablet Take 300 mg by mouth every morning. 02/05/20  Yes [provider]  busPIRone (BUSPAR) 15 MG tablet Take 15 mg by mouth daily.   Yes [provider]  Carboxymethylcellulose Sod PF 0.5 % SOLN Place 1 drop into both eyes 4 (four) times daily as needed (dry eyes).   Yes [provider]  clobetasol ointment (TEMOVATE) 5.63 % Apply 1 Application topically 2 (two) times daily as needed (scars on feet from vasculitis).   Yes [provider]  cyclobenzaprine (FLEXERIL) 10 MG tablet Take 30 mg by mouth See admin instructions. 10 mg once daily for neck, headache, and muscular lower back pain + 30 mg at bedtime   Yes [provider]  cycloSPORINE (RESTASIS) 0.05 % ophthalmic emulsion Place 1 drop into both eyes every 12 (twelve) hours.   Yes [provider]  diltiazem (CARDIZEM CD) 180 MG 24 hr capsule Take 180 mg by mouth daily.   Yes [provider]  docusate sodium (COLACE) 100 MG capsule Take 100 mg by mouth 2 (two) times daily.   Yes  [provider]  DULoxetine (CYMBALTA) 60 MG capsule Take 1 capsule (60 mg total) by mouth 2 (two) times daily. 10/04/21 11/03/21 Yes Delfin Gant, NP  fluticasone (FLONASE) 50 MCG/ACT nasal spray Place 2 sprays into both nostrils daily.    Yes [provider]  folic acid (FOLVITE) 1 MG tablet Take 1 mg by mouth daily.   Yes [provider]  gabapentin (NEURONTIN) 300 MG capsule Take 3 capsules (900 mg total) by mouth at bedtime. Patient taking differently: Take 300-900 mg by mouth See admin instructions. Take 1 capsule by mouth in the morning and 3 capsules at bedtime 10/04/21 11/03/21 Yes Onuoha, Wynona Meals, NP  guaiFENesin (MUCINEX) 600 MG 12 hr tablet Take 600 mg by mouth 2 (two) times daily as needed for cough.   Yes [provider]  lisinopril (ZESTRIL) 20 MG tablet Take 20 mg by mouth daily.   Yes [provider]  lurasidone (LATUDA) 40 MG TABS tablet Take 40 mg  by mouth every evening. After supper   Yes [provider]  Melatonin 3 MG CAPS Take 6 mg by mouth at bedtime as needed (sleep).   Yes [provider]  metFORMIN (GLUCOPHAGE) 850 MG tablet Take 850 mg by mouth in the morning and at bedtime.   Yes [provider]  mycophenolate (CELLCEPT) 250 MG capsule Take 1,500 mg by mouth 2 (two) times daily.   Yes [provider]  Nutritional Supplements (ENSURE ORIGINAL) LIQD Take 1 Bottle by mouth daily as needed (in between meals).   Yes [provider]  oxcarbazepine (TRILEPTAL) 600 MG tablet Take 300 mg by mouth at bedtime.   Yes [provider]  oxyCODONE (OXY IR/ROXICODONE) 5 MG immediate release tablet Take 1-2 tablets (5-10 mg total) by mouth every 4 (four) hours as needed for severe pain. Patient taking differently: Take 5 mg by mouth every 4 (four) hours as needed for severe pain. 05/19/00  Yes Delora Fuel, MD  polyethylene glycol powder [facility]) 17 GM/SCOOP powder Take 17 g by  mouth daily.   Yes [provider]  tamsulosin (FLOMAX) 0.4 MG CAPS capsule Take 0.8 mg by mouth daily.   Yes [provider]  Tiotropium Bromide-Olodaterol 2.5-2.5 MCG/ACT AERS Inhale 2 each into the lungs every morning. 2 puffs   Yes [provider]  traZODone (DESYREL) 50 MG tablet Take 75 mg by mouth at bedtime.   Yes [provider]  hydrOXYzine (ATARAX) 10 MG tablet Take 10 mg by mouth 2 (two) times daily as needed for anxiety. Patient not taking: Reported on 11/03/2021    [provider]  methotrexate (RHEUMATREX) 2.5 MG tablet Take 15 mg by mouth once a week. Caution:Chemotherapy. Protect from light. Patient not taking: Reported on 11/03/2021    [provider]  mineral oil enema Place 1 enema rectally 2 (two) times daily as needed for severe constipation.    [provider]  Mometasone Furoate 200 MCG/ACT AERO Inhale 2 puffs into the lungs at bedtime as needed (shortness of breath/wheezing). Patient not taking: Reported on 11/03/2021    [provider]  ondansetron (ZOFRAN-ODT) 4 MG disintegrating tablet Take 1 tablet (4 mg total) by mouth every 8 (eight) hours as needed for nausea or vomiting. Patient not taking: Reported on 11/03/2021 10/04/21   Delfin Gant, NP  prednisoLONE acetate (PRED FORTE) 1 % ophthalmic suspension Place 1 drop into both eyes See admin instructions. Instill 1 drop into left eye every hour and follow taper schedule for Pred Forte for the right eye Patient not taking: Reported on 11/02/2021    [provider]    Physical Exam: Vitals:   11/02/21 2130 11/02/21 2145 11/02/21 2200 11/02/21 2334  BP: (!) 153/79 (!) 163/83 (!) 172/90   Pulse: 91 92 92   Resp: 20 18 (!) 21   Temp:    98 F (36.7 C)  SpO2: 97% 100% 98%   Weight:      Height:        Constitutional:no diaphoresis, no pallor  Eyes: PERTLA, lids and conjunctivae normal ENMT: Mucous membranes are moist. Posterior pharynx  clear of any exudate or lesions.   Neck: supple, no masses  Respiratory: Diminished breath sounds bilaterally with prolonged expiratory phase. Dyspneic with speech.  Cardiovascular: S1 & S2 heard, regular rate and rhythm. No significant JVD.  Abdomen: No distension, no tenderness, soft. Bowel sounds active.  Musculoskeletal: no clubbing / cyanosis. No joint deformity upper and lower extremities.  Skin: no significant rashes, lesions, ulcers. Warm, dry, well-perfused. Neurologic: CN 2-12 grossly intact. Moving all extremities. Alert and oriented.  Psychiatric: Calm. Cooperative.    Labs and Imaging on Admission: I have personally reviewed following labs and imaging studies  CBC: Recent Labs  Lab 11/02/21 2005  WBC 11.7*  NEUTROABS 9.0*  HGB 10.9*  HCT 35.5*  MCV 83.7  PLT 756   Basic Metabolic Panel: Recent Labs  Lab 11/02/21 2005  NA 140  K 3.9  CL 103  CO2 27  GLUCOSE 203*  BUN 13  CREATININE 1.05  CALCIUM 9.2   GFR: Estimated Creatinine Clearance: 97.1 mL/min (by C-G formula based on SCr of 1.05 mg/dL). Liver Function Tests: Recent Labs  Lab 11/02/21 2005  AST 16  ALT 13  ALKPHOS 84  BILITOT 0.3  PROT 7.6  ALBUMIN 3.6   No results for input(s): "LIPASE", "AMYLASE" in the last 168 hours. No results for input(s): "AMMONIA" in the last 168 hours. Coagulation Profile: No results for input(s): "INR", "PROTIME" in the last 168 hours. Cardiac Enzymes: No results for input(s): "CKTOTAL", "CKMB", "CKMBINDEX", "TROPONINI" in the last 168 hours. BNP (last 3 results) No results for input(s): "PROBNP" in the last 8760 hours. HbA1C: No results for input(s): "HGBA1C" in the last 72 hours. CBG: No results for input(s): "GLUCAP" in the last 168 hours. Lipid Profile: No results for input(s): "CHOL", "HDL", "LDLCALC", "TRIG", "CHOLHDL", "LDLDIRECT" in the last 72 hours. Thyroid Function Tests: No results for input(s): "TSH", "T4TOTAL", "FREET4", "T3FREE", "THYROIDAB"  in the last 72 hours. Anemia Panel: No results for input(s): "VITAMINB12", "FOLATE", "FERRITIN", "TIBC", "IRON", "RETICCTPCT" in the last 72 hours. Urine analysis:    Component Value Date/Time   COLORURINE YELLOW 10/12/2021 2113   APPEARANCEUR CLEAR 10/12/2021 2113   LABSPEC 1.027 10/12/2021 2113   PHURINE 7.0 10/12/2021 2113   GLUCOSEU NEGATIVE 10/12/2021 2113   HGBUR NEGATIVE 10/12/2021 2113   BILIRUBINUR NEGATIVE 10/12/2021 2113   KETONESUR 5 (A) 10/12/2021 2113   PROTEINUR 30 (A) 10/12/2021 2113   UROBILINOGEN 1.0 01/12/2013 0024   NITRITE NEGATIVE 10/12/2021 2113   LEUKOCYTESUR TRACE (A) 10/12/2021 2113   Sepsis Labs: @LABRCNTIP (procalcitonin:4,lacticidven:4) )No results found for this or any previous visit (from the past 240 hour(s)).   Radiological Exams on Admission: CT Angio Chest PE W and/or Wo Contrast  Result Date: 11/02/2021 CLINICAL DATA:  Pulmonary embolism (PE) suspected, high prob. Dyspnea, metastatic lung cancer. EXAM: CT ANGIOGRAPHY CHEST WITH CONTRAST TECHNIQUE: Multidetector CT imaging of the chest was performed using the standard protocol during bolus administration of intravenous contrast. Multiplanar CT image reconstructions and MIPs were obtained to evaluate the vascular anatomy. RADIATION DOSE REDUCTION: This exam was performed according to the departmental dose-optimization program which includes automated exposure control, adjustment of the mA and/or kV according to patient size and/or use of iterative reconstruction technique. CONTRAST:  61mL OMNIPAQUE IOHEXOL 350 MG/ML SOLN COMPARISON:  09/30/2021 FINDINGS: Cardiovascular: There is adequate opacification of the pulmonary arterial tree. No intraluminal filling defect identified to suggest acute pulmonary embolism. Central pulmonary arteries are of normal caliber. Extensive multi-vessel coronary artery calcification. Global cardiac size is within normal limits. No pericardial effusion. Mild mixed atherosclerotic  plaque within the thoracic aorta. No aortic aneurysm. Mediastinum/Nodes: No enlarged mediastinal, hilar, or axillary lymph nodes. Thyroid gland, trachea, and esophagus demonstrate no significant findings. Lungs/Pleura: Severe emphysema. Left-sided volume loss is again identified. Partially loculated left pleural effusion is again seen and appears stable in size since prior examination. Associated  pleural thickening is again identified, similar prior examination. Superimposed airspace infiltrate peripherally within the left lower lobe is unchanged. Nodular paramediastinal densities along left heart border have developed which may represent loculated pleural fluid or peripherally impacted airways given the relatively rapid development. Bullous changes again noted at the right lung base. No central obstructing mass. No pneumothorax. Upper Abdomen: No acute abnormality. Musculoskeletal: Subacute fractures of the left 8, 9 ribs is again identified adjacent to the complex pleural effusion no acute bone abnormality. Degenerative changes are seen within the thoracic spine. Review of the MIP images confirms the above findings. IMPRESSION: 1. No pulmonary embolism. 2. Extensive multi-vessel coronary artery calcification. 3. Severe emphysema. 4. Stable partially loculated left pleural effusion with associated pleural thickening. This may be posttraumatic in nature and represent an underlying hemothorax given the adjacent subacute fractures of the left eighth and ninth ribs, again noted. 5. Stable airspace infiltrate within the left lower lobe. This may present represent persistent or recurrent pneumonic infiltrate though additional considerations such as radiation pneumonitis in the appropriate clinical setting or pulmonary adenocarcinoma in this patient with a history of lung cancer could appear similarly. 6. Interval development of nodular paramediastinal densities along the left heart border which may represent loculated  pleural fluid or peripherally impacted airways given the relatively rapid development. Emphysema (ICD10-J43.9). Electronically Signed   By: Fidela Salisbury M.D.   On: 11/02/2021 21:36   DG Chest 2 View  Result Date: 11/02/2021 CLINICAL DATA:  Dyspnea. EXAM: CHEST - 2 VIEW COMPARISON:  October 12, 2021. FINDINGS: Stable cardiomediastinal silhouette. Stable left basilar atelectasis or infiltrate is noted with associated pleural effusion. Stable mild right basilar atelectasis or infiltrate is noted. Bony thorax is unremarkable. IMPRESSION: Stable bibasilar opacities as described above. Electronically Signed   By: Marijo Conception M.D.   On: 11/02/2021 20:21    EKG: Independently reviewed. Sinus rhythm, non-specific IVCD.   Assessment/Plan   1. COPD with acute exacerbation; acute on chronic hypoxic respiratory failure  - Presents with increased SOB and increased sputum production, reports saturations as low as 70s-80s at home on his usual FiO2  - Improved with breathing treatment given by EMS en route but continues to be dyspneic at rest  - Culture sputum, start systemic steroid, start Rocephin, schedule SAMA-SABA, and use additional SABA as needed    2. Left pleural effusion  - PleurX catheter was removed two months ago   - Appears stable; consider thoracentesis if respiratory status fails to improve with treatment of COPD exacerbation  3. Non-small cell lung cancer  - Followed by Dr. Julien Nordmann who recommended palliative care or hospice   4. Hx of PE  - Continue Eliquis    5. Depression, anxiety  - Continue Wellbutrin, Buspar, Cymbalta, Latuda, trazodone    6. Chronic pain  - No pain complaints on admission  - Prescription database reviewed  - Continue home regimen   7. Vasculitis  - Continue Cellcept     DVT prophylaxis: Eliquis  Code Status: Full  Level of Care: Level of care: Telemetry Family Communication: none present  Disposition Plan:  Patient is from: home  Anticipated d/c  is to: TBD Anticipated d/c date is: 11/05/21  Patient currently: pending improved respiratory status  Consults called: none  Admission status: inpatient     Vianne Bulls, MD [facility]  11/03/2021, 12:57 AM

## 2021-11-03 NOTE — Progress Notes (Signed)
Pt was brought to [place] for diagnostic thoracentesis. Upon US examination, there was no fluid on R and a very small fluid pocket on L that was not large enough to safely access.  Exam was ended and explained to patient.  Patient was in agreement with findings.      Narda Rutherford, AGNP-BC 11/03/2021, 2:56 PM

## 2021-11-03 NOTE — ED Notes (Signed)
ED TO INPATIENT HANDOFF REPORT  Name/Age/Gender Mario [name] 71 y.o. male  Code Status    Code Status Orders  (From admission, onward)           Start     Ordered   11/03/21 0057  Full code  Continuous        11/03/21 0057           Code Status History     Date Active Date Inactive Code Status Order ID Comments User Context   10/08/2021 1333 10/09/2021 1719 Full Code [id number]  Azucena Cecil, PA-C ED   10/01/2021 1932 10/04/2021 1934 Full Code [id number]  Lianne Cure, DO ED   06/04/9627 2345 10/01/2021 1822 Full Code [id number]  Fatima Blank, MD ED   09/23/2021 0029 09/26/2021 2237 DNR [id number]  Vernelle Emerald, MD ED   08/25/2021 1020 09/07/2021 1653 DNR [id number]  Philis Pique, NP Inpatient   08/24/2021 0402 08/25/2021 1020 Full Code [id number]  Vianne Bulls, MD ED   08/21/2021 0031 08/23/2021 1845 Full Code [id number]  Vianne Bulls, MD ED   08/07/2021 0956 08/11/2021 0427 Full Code [id number]  Allie Bossier, MD ED   08/03/2021 1851 08/06/2021 0348 Full Code [id number]  Geradine Girt, DO Inpatient   06/17/2021 0046 07/06/2021 1726 Full Code [id number]  Orene Desanctis, DO ED   06/04/2021 0139 06/11/2021 1609 Full Code [id number]  Howerter, Ethelda Chick, DO ED   05/21/2021 2320 05/25/2021 0549 Full Code [id number]  Rehman, [place], DO ED   05/20/2021 0150 05/21/2021 2025 Full Code [id number]  [place], [place], DO ED   02/04/2021 0105 02/04/2021 2154 Full Code [id number]  Lenore Cordia, MD ED   01/09/2021 0612 01/10/2021 2012 Full Code [id number]  Shela Leff, MD Inpatient   09/04/2020 0923 09/05/2020 1724 Full Code [id number]  Jonnie Finner, DO ED   07/26/2020 1449 08/05/2020 1913 Full Code [id number]  Jonnie Finner, DO Inpatient   04/04/2020 0033 04/07/2020 2141 Full Code [id number]  Rise Patience, MD ED   01/04/2020 1615 01/06/2020 1904 Full Code [id number]  Jonnie Finner, DO Inpatient   12/26/2019 1039 12/28/2019 1810 Full Code [id number]  Karmen Bongo, MD ED   07/12/2019  0557 07/15/2019 1904 Full Code [id number]  Merrily Pew, MD ED   07/03/2019 0016 07/03/2019 2011 Full Code [id number]  Reubin Milan, MD ED   07/03/2019 0016 07/03/2019 0016 Full Code [id number]  Reubin Milan, MD ED   06/23/2019 0425 06/25/2019 2122 Full Code [id number]  Edmonia Lynch, DO ED   10/10/2018 2021 10/13/2018 1641 Full Code [id number]  Ward, Ozella Almond, PA-C ED   04/30/2018 1759 05/01/2018 1714 Full Code [id number]  Delia Heady, PA-C ED   02/20/2018 1759 02/21/2018 1452 Full Code [id number]  Maudie Flakes, MD ED   07/16/2017 0309 07/21/2017 1734 Full Code [id number]  Etta Quill, DO ED   09/17/2016 2107 09/18/2016 2055 Full Code [id number]  Lovenia Kim, MD ED   06/27/2016 1523 07/07/2016 1718 Full Code [id number]  Nani Skillern, PA-C ED   05/23/2014 2356 05/26/2014 1656 Full Code [id number]  Jani Gravel, MD Inpatient   01/21/2013 2202 01/23/2013 2202 Full Code [id number]  Martie Lee, PA-C ED   01/12/2013 0020 01/12/2013 0641 Full Code [id number]  Margarita Mail, PA-C ED   01/10/2013 0122 01/11/2013 2122 Full Code [id number]  Rise Patience, MD Inpatient      Advance Directive Documentation  Flowsheet Row Most Recent Value  Type of Advance Directive Living will, Healthcare Power of Attorney  Pre-existing out of facility DNR order (yellow form or pink MOST form) --  "MOST" Form in Place? --       Home/SNF/Other Home  Chief Complaint Acute and chronic respiratory failure with hypoxia (HCC) [J96.21]  Level of Care/Admitting Diagnosis ED Disposition     ED Disposition  Admit   Condition  --   [place]: [place] [100102]  Level of Care: Telemetry [5]  Admit to tele based on following criteria: Monitor QTC interval  May admit patient to [facility] or [facility] if equivalent level of care is available:: Yes  Covid Evaluation: Asymptomatic - no recent exposure (last 10 days) testing not required  Diagnosis: Acute and  chronic respiratory failure with hypoxia [facility]) [818563]  Admitting Physician: Vianne Bulls [[id number]]  Attending Physician: Vianne Bulls [[id number]]  Certification:: I certify this patient will need inpatient services for at least 2 midnights  Estimated Length of Stay: 3          Medical History Past Medical History:  Diagnosis Date   Anxiety    Bronchitis    COPD (chronic obstructive pulmonary disease) ([place])    Depression    History of radiation therapy    Left lung- 10/05/20-10/15/20- Dr. Gery Pray   Hypertension    lung ca 09/2020   MI (myocardial infarction) [facility])    ????   OSA (obstructive sleep apnea)    Suicide attempt ([place])    Tension pneumothorax 06/27/2016   Uveitis     Allergies Allergies  Allergen Reactions   Demerol [Meperidine] Nausea And Vomiting and Other (See Comments)    Made the patient "violently sick"   Zocor [Simvastatin] Nausea And Vomiting and Other (See Comments)    Made him very jittery, also   Beet [Beta Vulgaris] Nausea And Vomiting   Liver Nausea And Vomiting    IV Location/Drains/Wounds Patient Lines/Drains/Airways Status     Active Line/Drains/Airways     Name Placement date Placement time Site Days   Peripheral IV 11/02/21 20 G Right Antecubital 11/02/21  2008  Antecubital  1   Incision (Closed) 08/23/21 Flank Left;Upper 08/23/21  0800  -- 72            Labs/Imaging Results for orders placed or performed during the hospital encounter of 11/02/21 (from the past 48 hour(s))  Comprehensive metabolic panel     Status: Abnormal   Collection Time: 11/02/21  8:05 PM  Result Value Ref Range   Sodium 140 135 - 145 mmol/L   Potassium 3.9 3.5 - 5.1 mmol/L   Chloride 103 98 - 111 mmol/L   CO2 27 22 - 32 mmol/L   Glucose, Bld 203 (H) 70 - 99 mg/dL    Comment: Glucose reference range applies only to samples taken after fasting for at least 8 hours.   BUN 13 8 - 23 mg/dL   Creatinine, Ser 1.05 0.61 - 1.24 mg/dL   Calcium 9.2  8.9 - 10.3 mg/dL   Total Protein 7.6 6.5 - 8.1 g/dL   Albumin 3.6 3.5 - 5.0 g/dL   AST 16 15 - 41 U/L   ALT 13 0 - 44 U/L   Alkaline Phosphatase 84 38 - 126 U/L   Total Bilirubin 0.3 0.3 - 1.2 mg/dL   GFR, Estimated >60 >60 mL/min    Comment: (NOTE) Calculated using the CKD-EPI Creatinine Equation (2021)  Anion gap 10 5 - 15    Comment: Performed at [facility], [place] [address], [place], [place] [address]  CBC with Differential     Status: Abnormal   Collection Time: 11/02/21  8:05 PM  Result Value Ref Range   WBC 11.7 (H) 4.0 - 10.5 K/uL   RBC 4.24 4.22 - 5.81 MIL/uL   Hemoglobin 10.9 (L) 13.0 - 17.0 g/dL   HCT 35.5 (L) 39.0 - 52.0 %   MCV 83.7 80.0 - 100.0 fL   MCH 25.7 (L) 26.0 - 34.0 pg   MCHC 30.7 30.0 - 36.0 g/dL   RDW 16.9 (H) 11.5 - 15.5 %   Platelets 386 150 - 400 K/uL   nRBC 0.0 0.0 - 0.2 %   Neutrophils Relative % 78 %   Neutro Abs 9.0 (H) 1.7 - 7.7 K/uL   Lymphocytes Relative 13 %   Lymphs Abs 1.5 0.7 - 4.0 K/uL   Monocytes Relative 8 %   Monocytes Absolute 0.9 0.1 - 1.0 K/uL   Eosinophils Relative 1 %   Eosinophils Absolute 0.2 0.0 - 0.5 K/uL   Basophils Relative 0 %   Basophils Absolute 0.0 0.0 - 0.1 K/uL   Immature Granulocytes 0 %   Abs Immature Granulocytes 0.04 0.00 - 0.07 K/uL    Comment: Performed at [facility], [place] [address] [place], [place] [address]  Troponin I (High Sensitivity)     Status: None   Collection Time: 11/02/21  8:05 PM  Result Value Ref Range   Troponin I (High Sensitivity) 5 <18 ng/L    Comment: (NOTE) Elevated high sensitivity troponin I (hsTnI) values and significant  changes across serial measurements may suggest ACS but many other  chronic and acute conditions are known to elevate hsTnI results.  Refer to the "Links" section for chest pain algorithms and additional  guidance. Performed at [facility], [place] [address] [place], [place] [address]   Brain  natriuretic peptide     Status: None   Collection Time: 11/02/21  8:05 PM  Result Value Ref Range   B Natriuretic Peptide 21.8 0.0 - 100.0 pg/mL    Comment: Performed at [facility], [place] [address], [place], [place] [address]  Troponin I (High Sensitivity)     Status: None   Collection Time: 11/02/21  9:12 PM  Result Value Ref Range   Troponin I (High Sensitivity) 5 <18 ng/L    Comment: (NOTE) Elevated high sensitivity troponin I (hsTnI) values and significant  changes across serial measurements may suggest ACS but many other  chronic and acute conditions are known to elevate hsTnI results.  Refer to the "Links" section for chest pain algorithms and additional  guidance. Performed at [facility], [place] [address], [place], [place] [address]   Blood gas, venous (at [facility] and [facility], not at [facility])     Status: Abnormal   Collection Time: 11/02/21 11:51 PM  Result Value Ref Range   pH, Ven 7.42 7.25 - 7.43   pCO2, Ven 46 44 - 60 mmHg   pO2, Ven 36 32 - 45 mmHg   Bicarbonate 29.8 (H) 20.0 - 28.0 mmol/L   Acid-Base Excess 4.6 (H) 0.0 - 2.0 mmol/L   O2 Saturation 60.4 %   Patient temperature 37.0     Comment: Performed at [facility], [place] [address] [place], [place] [address]  CBG monitoring, ED     Status: Abnormal  Collection Time: 11/03/21  1:34 AM  Result Value Ref Range   Glucose-Capillary 105 (H) 70 - 99 mg/dL    Comment: Glucose reference range applies only to samples taken after fasting for at least 8 hours.  Basic metabolic panel     Status: Abnormal   Collection Time: 11/03/21  5:00 AM  Result Value Ref Range   Sodium 139 135 - 145 mmol/L   Potassium 5.1 3.5 - 5.1 mmol/L   Chloride 105 98 - 111 mmol/L   CO2 26 22 - 32 mmol/L   Glucose, Bld 178 (H) 70 - 99 mg/dL    Comment: Glucose reference range applies only to samples taken after fasting for at least 8 hours.   BUN 15 8 - 23 mg/dL   Creatinine, Ser 1.12 0.61 - 1.24  mg/dL   Calcium 9.0 8.9 - 10.3 mg/dL   GFR, Estimated >60 >60 mL/min    Comment: (NOTE) Calculated using the CKD-EPI Creatinine Equation (2021)    Anion gap 8 5 - 15    Comment: Performed at [facility], [place] [address], [place], [place] [address]  CBC     Status: Abnormal   Collection Time: 11/03/21  5:00 AM  Result Value Ref Range   WBC 10.9 (H) 4.0 - 10.5 K/uL   RBC 4.16 (L) 4.22 - 5.81 MIL/uL   Hemoglobin 10.7 (L) 13.0 - 17.0 g/dL   HCT 34.6 (L) 39.0 - 52.0 %   MCV 83.2 80.0 - 100.0 fL   MCH 25.7 (L) 26.0 - 34.0 pg   MCHC 30.9 30.0 - 36.0 g/dL   RDW 16.8 (H) 11.5 - 15.5 %   Platelets 430 (H) 150 - 400 K/uL   nRBC 0.0 0.0 - 0.2 %    Comment: Performed at [facility], [place] [address], [place], [place] [address]   CT Angio Chest PE W and/or Wo Contrast  Result Date: 11/02/2021 CLINICAL DATA:  Pulmonary embolism (PE) suspected, high prob. Dyspnea, metastatic lung cancer. EXAM: CT ANGIOGRAPHY CHEST WITH CONTRAST TECHNIQUE: Multidetector CT imaging of the chest was performed using the standard protocol during bolus administration of intravenous contrast. Multiplanar CT image reconstructions and MIPs were obtained to evaluate the vascular anatomy. RADIATION DOSE REDUCTION: This exam was performed according to the departmental dose-optimization program which includes automated exposure control, adjustment of the mA and/or kV according to patient size and/or use of iterative reconstruction technique. CONTRAST:  23mL OMNIPAQUE IOHEXOL 350 MG/ML SOLN COMPARISON:  09/30/2021 FINDINGS: Cardiovascular: There is adequate opacification of the pulmonary arterial tree. No intraluminal filling defect identified to suggest acute pulmonary embolism. Central pulmonary arteries are of normal caliber. Extensive multi-vessel coronary artery calcification. Global cardiac size is within normal limits. No pericardial effusion. Mild mixed atherosclerotic plaque within the  thoracic aorta. No aortic aneurysm. Mediastinum/Nodes: No enlarged mediastinal, hilar, or axillary lymph nodes. Thyroid gland, trachea, and esophagus demonstrate no significant findings. Lungs/Pleura: Severe emphysema. Left-sided volume loss is again identified. Partially loculated left pleural effusion is again seen and appears stable in size since prior examination. Associated pleural thickening is again identified, similar prior examination. Superimposed airspace infiltrate peripherally within the left lower lobe is unchanged. Nodular paramediastinal densities along left heart border have developed which may represent loculated pleural fluid or peripherally impacted airways given the relatively rapid development. Bullous changes again noted at the right lung base. No central obstructing mass. No pneumothorax. Upper Abdomen: No acute abnormality. Musculoskeletal: Subacute fractures of the left 8, 9 ribs  is again identified adjacent to the complex pleural effusion no acute bone abnormality. Degenerative changes are seen within the thoracic spine. Review of the MIP images confirms the above findings. IMPRESSION: 1. No pulmonary embolism. 2. Extensive multi-vessel coronary artery calcification. 3. Severe emphysema. 4. Stable partially loculated left pleural effusion with associated pleural thickening. This may be posttraumatic in nature and represent an underlying hemothorax given the adjacent subacute fractures of the left eighth and ninth ribs, again noted. 5. Stable airspace infiltrate within the left lower lobe. This may present represent persistent or recurrent pneumonic infiltrate though additional considerations such as radiation pneumonitis in the appropriate clinical setting or pulmonary adenocarcinoma in this patient with a history of lung cancer could appear similarly. 6. Interval development of nodular paramediastinal densities along the left heart border which may represent loculated pleural fluid or  peripherally impacted airways given the relatively rapid development. Emphysema (ICD10-J43.9). Electronically Signed   By: Fidela Salisbury M.D.   On: 11/02/2021 21:36   DG Chest 2 View  Result Date: 11/02/2021 CLINICAL DATA:  Dyspnea. EXAM: CHEST - 2 VIEW COMPARISON:  October 12, 2021. FINDINGS: Stable cardiomediastinal silhouette. Stable left basilar atelectasis or infiltrate is noted with associated pleural effusion. Stable mild right basilar atelectasis or infiltrate is noted. Bony thorax is unremarkable. IMPRESSION: Stable bibasilar opacities as described above. Electronically Signed   By: Marijo Conception M.D.   On: 11/02/2021 20:21    Pending Labs Unresulted Labs (From admission, onward)     Start     Ordered   11/03/21 0500  Hemoglobin A1c  Tomorrow morning,   R       Comments: To assess prior glycemic control    11/03/21 0057   11/03/21 0865  Basic metabolic panel  Daily at 5am,   R      11/03/21 0057   11/03/21 0500  CBC  Daily at 5am,   R      11/03/21 0057   11/03/21 0056  Expectorated Sputum Assessment w Gram Stain, Rflx to Resp Cult  (COPD / Pneumonia / Cellulitis / Lower Extremity Wound)  Once,   R        11/03/21 0057            Vitals/Pain Today's Vitals   11/03/21 0530 11/03/21 0545 11/03/21 0600 11/03/21 0616  BP: (!) 158/86 (!) 163/89 (!) 175/88   Pulse: 89 93 94   Resp: 19 19 16    Temp:    97.6 F (36.4 C)  TempSrc:    Oral  SpO2: 92% 95% 97%   Weight:      Height:      PainSc:        Isolation Precautions No active isolations  Medications Medications  oxyCODONE (Oxy IR/ROXICODONE) immediate release tablet 5 mg (has no administration in time range)  diltiazem (CARDIZEM CD) 24 hr capsule 180 mg (has no administration in time range)  lisinopril (ZESTRIL) tablet 20 mg (has no administration in time range)  buPROPion (WELLBUTRIN XL) 24 hr tablet 300 mg (has no administration in time range)  busPIRone (BUSPAR) tablet 15 mg (has no administration in time  range)  DULoxetine (CYMBALTA) DR capsule 60 mg (60 mg Oral Given 11/03/21 0147)  hydrOXYzine (ATARAX) tablet 10 mg (has no administration in time range)  lurasidone (LATUDA) tablet 40 mg (has no administration in time range)  traZODone (DESYREL) tablet 75 mg (75 mg Oral Given 11/03/21 0147)  docusate sodium (COLACE) capsule 100 mg (100 mg Oral Given  11/03/21 0127)  tamsulosin (FLOMAX) capsule 0.8 mg (has no administration in time range)  apixaban (ELIQUIS) tablet 5 mg (5 mg Oral Given 11/03/21 0148)  mycophenolate (CELLCEPT) capsule 1,500 mg (1,500 mg Oral Given 11/03/21 0149)  gabapentin (NEURONTIN) capsule 900 mg (900 mg Oral Given 11/03/21 0148)  Oxcarbazepine (TRILEPTAL) tablet 300 mg (300 mg Oral Given 11/03/21 0147)  polyvinyl alcohol (LIQUIFILM TEARS) 1.4 % ophthalmic solution 1 drop (has no administration in time range)  cycloSPORINE (RESTASIS) 0.05 % ophthalmic emulsion 1 drop (1 drop Both Eyes Given 11/03/21 0150)  insulin aspart (novoLOG) injection 0-6 Units (has no administration in time range)  insulin aspart (novoLOG) injection 0-5 Units ( Subcutaneous Not Given 11/03/21 0136)  cefTRIAXone (ROCEPHIN) 1 g in sodium chloride 0.9 % 100 mL IVPB (0 g Intravenous Stopped 11/03/21 0244)  methylPREDNISolone sodium succinate (SOLU-MEDROL) 125 mg/2 mL injection 125 mg (125 mg Intravenous Given 11/03/21 0126)    Followed by  predniSONE (DELTASONE) tablet 40 mg (has no administration in time range)  ipratropium-albuterol (DUONEB) 0.5-2.5 (3) MG/3ML nebulizer solution 3 mL (has no administration in time range)  albuterol (PROVENTIL) (2.5 MG/3ML) 0.083% nebulizer solution 2.5 mg (has no administration in time range)  sodium chloride flush (NS) 0.9 % injection 3 mL (3 mLs Intravenous Not Given 11/03/21 0136)  sodium chloride flush (NS) 0.9 % injection 3 mL (3 mLs Intravenous Given 11/03/21 0136)  sodium chloride flush (NS) 0.9 % injection 3 mL (has no administration in time range)  0.9 %  sodium chloride infusion (has  no administration in time range)  acetaminophen (TYLENOL) tablet 650 mg (has no administration in time range)    Or  acetaminophen (TYLENOL) suppository 650 mg (has no administration in time range)  bisacodyl (DULCOLAX) suppository 10 mg (has no administration in time range)  gabapentin (NEURONTIN) capsule 300 mg (has no administration in time range)  polyethylene glycol (MIRALAX / GLYCOLAX) packet 17 g (has no administration in time range)  HYDROcodone-acetaminophen (NORCO/VICODIN) 5-325 MG per tablet 1 tablet (1 tablet Oral Given 11/02/21 2128)  iohexol (OMNIPAQUE) 350 MG/ML injection 75 mL (75 mLs Intravenous Contrast Given 11/02/21 2117)    Mobility walks with person assist

## 2021-11-04 DIAGNOSIS — J441 Chronic obstructive pulmonary disease with (acute) exacerbation: Secondary | ICD-10-CM | POA: Diagnosis not present

## 2021-11-04 DIAGNOSIS — J439 Emphysema, unspecified: Secondary | ICD-10-CM | POA: Diagnosis not present

## 2021-11-04 DIAGNOSIS — Z86711 Personal history of pulmonary embolism: Secondary | ICD-10-CM | POA: Diagnosis not present

## 2021-11-04 DIAGNOSIS — J9621 Acute and chronic respiratory failure with hypoxia: Secondary | ICD-10-CM | POA: Diagnosis not present

## 2021-11-04 DIAGNOSIS — C3492 Malignant neoplasm of unspecified part of left bronchus or lung: Secondary | ICD-10-CM | POA: Diagnosis not present

## 2021-11-04 LAB — CBC
HCT: 34.5 % — ABNORMAL LOW (ref 39.0–52.0)
Hemoglobin: 10.7 g/dL — ABNORMAL LOW (ref 13.0–17.0)
MCH: 25.8 pg — ABNORMAL LOW (ref 26.0–34.0)
MCHC: 31 g/dL (ref 30.0–36.0)
MCV: 83.1 fL (ref 80.0–100.0)
Platelets: 407 10*3/uL — ABNORMAL HIGH (ref 150–400)
RBC: 4.15 MIL/uL — ABNORMAL LOW (ref 4.22–5.81)
RDW: 16.6 % — ABNORMAL HIGH (ref 11.5–15.5)
WBC: 18.5 10*3/uL — ABNORMAL HIGH (ref 4.0–10.5)
nRBC: 0 % (ref 0.0–0.2)

## 2021-11-04 LAB — BASIC METABOLIC PANEL
Anion gap: 9 (ref 5–15)
BUN: 17 mg/dL (ref 8–23)
CO2: 26 mmol/L (ref 22–32)
Calcium: 8.9 mg/dL (ref 8.9–10.3)
Chloride: 103 mmol/L (ref 98–111)
Creatinine, Ser: 0.98 mg/dL (ref 0.61–1.24)
GFR, Estimated: >60 mL/min (ref >60)
Glucose, Bld: 290 mg/dL — ABNORMAL HIGH (ref 70–99)
Potassium: 4.6 mmol/L (ref 3.5–5.1)
Sodium: 138 mmol/L (ref 135–145)

## 2021-11-04 LAB — MAGNESIUM: Magnesium: 2.2 mg/dL (ref 1.7–2.4)

## 2021-11-04 LAB — GLUCOSE, CAPILLARY
Glucose-Capillary: 264 mg/dL — ABNORMAL HIGH (ref 70–99)
Glucose-Capillary: 280 mg/dL — ABNORMAL HIGH (ref 70–99)

## 2021-11-04 MED ORDER — ALBUTEROL SULFATE HFA 108 (90 BASE) MCG/ACT IN AERS: 2.0000 | INHALATION_SPRAY | RESPIRATORY_TRACT | Status: DC | PRN

## 2021-11-04 MED ORDER — METHYLPREDNISOLONE SODIUM SUCC 125 MG IJ SOLR
80.0000 mg | Freq: Two times a day (BID) | INTRAMUSCULAR | Status: DC
  Administered 2021-11-04: 80 mg via INTRAVENOUS
  Filled 2021-11-04: qty 2

## 2021-11-04 MED ORDER — PREDNISONE 20 MG PO TABS: 40.0000 mg | ORAL_TABLET | Freq: Every day | ORAL | 0 refills | Status: AC

## 2021-11-04 MED ORDER — MOMETASONE FUROATE 200 MCG/ACT IN AERO: 2.0000 | INHALATION_SPRAY | Freq: Two times a day (BID) | RESPIRATORY_TRACT | 0 refills | Status: DC

## 2021-11-04 NOTE — Discharge Summary (Signed)
Physician Discharge Summary  Mario [name]:[id number] DOB: 12/25/1950 DOA: 11/02/2021  PCP: [facility]  Admit date: 11/02/2021 Discharge date: 11/04/2021  Admitted From: Home Disposition: Home  Recommendations for Outpatient Follow-up:  Follow up with PCP in 1 week with repeat CBC/BMP Outpatient follow-up with oncology and pulmonary Recommend outpatient follow-up with palliative care Follow up in ED if symptoms worsen or new appear   Home Health: No Equipment/Devices: None  Discharge Condition: Stable CODE STATUS: Full Diet recommendation: Heart healthy  Brief/Interim Summary:  71 y.o. male with medical history significant for non-small cell lung cancer status post SBRT, malignant pleural effusion status post left Pleurx catheter (subsequently removed on 09/01/2021), vasculitis and ankylosing spondylitis, PE on Eliquis, OSA with CPAP intolerance, type 2 diabetes mellitus, chronic pain, depression, anxiety, and COPD with chronic hypoxic respiratory failure presented with worsening shortness of breath.  On presentation, saturating in the low 90s on 3 L oxygen.  CTA chest was negative for PE but notable for severe emphysema, stable left pleural effusion.  He was treated with steroids, nebs.  During the hospitalization, his respiratory status has improved.  He is adamant about going home today.  He will be discharged home on oral prednisone today.  He will need outpatient follow-up with PCP/pulmonary/oncology/palliative care.  Discharge Diagnoses:   COPD exacerbation Acute on chronic hypoxic respiratory failure -Required 5 L oxygen on presentation.  Normally wears 3 L oxygen via nasal cannula. -Imaging as above. -Currently on high-dose IV steroids along with nebs. -During the hospitalization, his respiratory status has improved.  He is adamant about going home today.  He will be discharged home today on oral prednisone 40 mg daily for 7 days.  Continue inhaled regimen on  discharge. -Outpatient follow-up with PCP and pulmonary. -Currently on 3 L oxygen by nasal cannula.  Left pleural effusion -Pleurx catheter was removed 2 months ago.  Currently stable.  Ultrasound-guided thoracentesis was attempted but there was not enough fluid to be removed.  Non-small cell lung cancer -Outpatient follow-up with oncology at [facility].  Does not follow-up with Dr. Julien Nordmann anymore. -Consider outpatient follow-up with palliative care as recommended by Dr. Julien Nordmann in the past  History of PE -Continue Eliquis  Leukocytosis -Possibly from steroid use  Anemia of chronic disease -From chronic illnesses.  Outpatient follow-up  Thrombocytosis -Possibly reactive.  Outpatient follow-up  Diabetes mellitus type 2 -Resume metformin on discharge.  Carb modified diet.  Outpatient follow-up with PCP.  A1c 7  Depression/anxiety -Continue home regimen.  Outpatient follow-up with psychiatry  Chronic pain -Continue home regimen.  Outpatient follow-up with PCP/pain management  Vasculitis -Continue home regimen.    Obesity -outpatient follow-up  Discharge Instructions  Discharge Instructions     Amb Referral to Palliative Care   Complete by: As directed    Ambulatory referral to Pulmonology   Complete by: As directed    Hospital follow up   Reason for referral: Asthma/COPD   Diet - low sodium heart healthy   Complete by: As directed    Increase activity slowly   Complete by: As directed       Allergies as of 11/04/2021       Reactions   Demerol [meperidine] Nausea And Vomiting, Other (See Comments)   Made the patient "violently sick"   Zocor [simvastatin] Nausea And Vomiting, Other (See Comments)   Made him very jittery, also   Beet [beta Vulgaris] Nausea And Vomiting   Liver Nausea And Vomiting  Medication List     STOP taking these medications    hydrOXYzine 10 MG tablet Commonly known as: ATARAX   prednisoLONE acetate 1 % ophthalmic  suspension Commonly known as: PRED FORTE       TAKE these medications    albuterol (2.5 MG/3ML) 0.083% nebulizer solution Commonly known as: PROVENTIL Inhale 3 mLs (2.5 mg total) by nebulization every 4 (four) hours as needed for wheezing or shortness of breath. What changed: when to take this   albuterol 108 (90 Base) MCG/ACT inhaler Commonly known as: VENTOLIN HFA Inhale 2 puffs into the lungs every 4 (four) hours as needed for wheezing or shortness of breath. What changed:  when to take this reasons to take this   apixaban 5 MG Tabs tablet Commonly known as: ELIQUIS Take 5 mg by mouth 2 (two) times daily.   buPROPion 300 MG 24 hr tablet Commonly known as: WELLBUTRIN XL Take 300 mg by mouth every morning.   busPIRone 15 MG tablet Commonly known as: BUSPAR Take 15 mg by mouth daily.   Carboxymethylcellulose Sod PF 0.5 % Soln Place 1 drop into both eyes 4 (four) times daily as needed (dry eyes).   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   cyclobenzaprine 10 MG tablet Commonly known as: FLEXERIL Take 30 mg by mouth See admin instructions. 10 mg once daily for neck, headache, and muscular lower back pain + 30 mg at bedtime   cycloSPORINE 0.05 % ophthalmic emulsion Commonly known as: RESTASIS Place 1 drop into both eyes every 12 (twelve) hours.   diltiazem 180 MG 24 hr capsule Commonly known as: CARDIZEM CD Take 180 mg by mouth daily.   docusate sodium 100 MG capsule Commonly known as: COLACE Take 100 mg by mouth 2 (two) times daily.   DULoxetine 60 MG capsule Commonly known as: CYMBALTA Take 1 capsule (60 mg total) by mouth 2 (two) times daily.   Ensure Original Liqd Take 1 Bottle by mouth daily as needed (in between meals).   fluticasone 50 MCG/ACT nasal spray Commonly known as: FLONASE Place 2 sprays into both nostrils daily.   folic acid 1 MG  tablet Commonly known as: FOLVITE Take 1 mg by mouth daily.   gabapentin 300 MG capsule Commonly known as: NEURONTIN Take 3 capsules (900 mg total) by mouth at bedtime. What changed:  how much to take when to take this additional instructions   guaiFENesin 600 MG 12 hr tablet Commonly known as: MUCINEX Take 600 mg by mouth 2 (two) times daily as needed for cough.   lisinopril 20 MG tablet Commonly known as: ZESTRIL Take 20 mg by mouth daily.   lurasidone 40 MG Tabs tablet Commonly known as: LATUDA Take 40 mg by mouth every evening. After supper   Melatonin 3 MG Caps Take 6 mg by mouth at bedtime as needed (sleep).   metFORMIN 850 MG tablet Commonly known as: GLUCOPHAGE Take 850 mg by mouth in the morning and at bedtime.   methotrexate 2.5 MG tablet Commonly known as: RHEUMATREX Take 15 mg by mouth once a week. Caution:Chemotherapy. Protect from light.   mineral oil enema Place 1 enema rectally 2 (two) times daily as needed for severe constipation.   Mometasone Furoate 200 MCG/ACT Aero Inhale 2 puffs into the lungs 2 (two) times daily. What changed:  when to take this reasons to take this   mycophenolate 250  MG capsule Commonly known as: CELLCEPT Take 1,500 mg by mouth 2 (two) times daily.   ondansetron 4 MG disintegrating tablet Commonly known as: ZOFRAN-ODT Take 1 tablet (4 mg total) by mouth every 8 (eight) hours as needed for nausea or vomiting.   oxcarbazepine 600 MG tablet Commonly known as: TRILEPTAL Take 300 mg by mouth at bedtime.   oxyCODONE 5 MG immediate release tablet Commonly known as: Oxy IR/ROXICODONE Take 1-2 tablets (5-10 mg total) by mouth every 4 (four) hours as needed for severe pain. What changed: how much to take   polyethylene glycol powder 17 GM/SCOOP powder Commonly known as: GLYCOLAX/MIRALAX Take 17 g by mouth daily.   predniSONE 20 MG tablet Commonly known as: DELTASONE Take 2 tablets (40 mg total) by mouth daily with  breakfast for 7 days. Start taking on: November 05, 2021   tamsulosin 0.4 MG Caps capsule Commonly known as: FLOMAX Take 0.8 mg by mouth daily.   Tiotropium Bromide-Olodaterol 2.5-2.5 MCG/ACT Aers Inhale 2 each into the lungs every morning. 2 puffs   traZODone 50 MG tablet Commonly known as: DESYREL Take 75 mg by mouth at bedtime.        Follow-up Information     Clinic, [place]. Schedule an appointment as soon as possible for a visit in 1 week(s).   Contact information: [place] [address] [phone]                Allergies  Allergen Reactions   Demerol [Meperidine] Nausea And Vomiting and Other (See Comments)    Made the patient "violently sick"   Zocor [Simvastatin] Nausea And Vomiting and Other (See Comments)    Made him very jittery, also   Beet [Beta Vulgaris] Nausea And Vomiting   Liver Nausea And Vomiting    Consultations: Palliative care   Procedures/Studies: [place] CHEST (PLEURAL EFFUSION)  Result Date: 11/03/2021 CLINICAL DATA:  Pleural effusion EXAM: CHEST ULTRASOUND COMPARISON:  CT 11/02/2021 FINDINGS: Targeted ultrasound of the left chest performed. Small left pleural effusion with echogenic material consistent with complex pleural effusion. IMPRESSION: Small volume complex left pleural effusion. Electronically Signed   By: Donavan Foil M.D.   On: 11/03/2021 15:14   CT Angio Chest PE W and/or Wo Contrast  Result Date: 11/02/2021 CLINICAL DATA:  Pulmonary embolism (PE) suspected, high prob. Dyspnea, metastatic lung cancer. EXAM: CT ANGIOGRAPHY CHEST WITH CONTRAST TECHNIQUE: Multidetector CT imaging of the chest was performed using the standard protocol during bolus administration of intravenous contrast. Multiplanar CT image reconstructions and MIPs were obtained to evaluate the vascular anatomy. RADIATION DOSE REDUCTION: This exam was performed according to the departmental dose-optimization program which  includes automated exposure control, adjustment of the mA and/or kV according to patient size and/or use of iterative reconstruction technique. CONTRAST:  51mL OMNIPAQUE IOHEXOL 350 MG/ML SOLN COMPARISON:  09/30/2021 FINDINGS: Cardiovascular: There is adequate opacification of the pulmonary arterial tree. No intraluminal filling defect identified to suggest acute pulmonary embolism. Central pulmonary arteries are of normal caliber. Extensive multi-vessel coronary artery calcification. Global cardiac size is within normal limits. No pericardial effusion. Mild mixed atherosclerotic plaque within the thoracic aorta. No aortic aneurysm. Mediastinum/Nodes: No enlarged mediastinal, hilar, or axillary lymph nodes. Thyroid gland, trachea, and esophagus demonstrate no significant findings. Lungs/Pleura: Severe emphysema. Left-sided volume loss is again identified. Partially loculated left pleural effusion is again seen and appears stable in size since prior examination. Associated pleural thickening is again identified, similar prior examination. Superimposed airspace infiltrate peripherally  within the left lower lobe is unchanged. Nodular paramediastinal densities along left heart border have developed which may represent loculated pleural fluid or peripherally impacted airways given the relatively rapid development. Bullous changes again noted at the right lung base. No central obstructing mass. No pneumothorax. Upper Abdomen: No acute abnormality. Musculoskeletal: Subacute fractures of the left 8, 9 ribs is again identified adjacent to the complex pleural effusion no acute bone abnormality. Degenerative changes are seen within the thoracic spine. Review of the MIP images confirms the above findings. IMPRESSION: 1. No pulmonary embolism. 2. Extensive multi-vessel coronary artery calcification. 3. Severe emphysema. 4. Stable partially loculated left pleural effusion with associated pleural thickening. This may be  posttraumatic in nature and represent an underlying hemothorax given the adjacent subacute fractures of the left eighth and ninth ribs, again noted. 5. Stable airspace infiltrate within the left lower lobe. This may present represent persistent or recurrent pneumonic infiltrate though additional considerations such as radiation pneumonitis in the appropriate clinical setting or pulmonary adenocarcinoma in this patient with a history of lung cancer could appear similarly. 6. Interval development of nodular paramediastinal densities along the left heart border which may represent loculated pleural fluid or peripherally impacted airways given the relatively rapid development. Emphysema (ICD10-J43.9). Electronically Signed   By: Fidela Salisbury M.D.   On: 11/02/2021 21:36   DG Chest 2 View  Result Date: 11/02/2021 CLINICAL DATA:  Dyspnea. EXAM: CHEST - 2 VIEW COMPARISON:  October 12, 2021. FINDINGS: Stable cardiomediastinal silhouette. Stable left basilar atelectasis or infiltrate is noted with associated pleural effusion. Stable mild right basilar atelectasis or infiltrate is noted. Bony thorax is unremarkable. IMPRESSION: Stable bibasilar opacities as described above. Electronically Signed   By: Marijo Conception M.D.   On: 11/02/2021 20:21   MR BRAIN WO CONTRAST  Result Date: 10/12/2021 CLINICAL DATA:  TIA, syncope EXAM: MRI HEAD WITHOUT CONTRAST TECHNIQUE: Multiplanar, multiecho pulse sequences of the brain and surrounding structures were obtained without intravenous contrast. COMPARISON:  09/20/2020 FINDINGS: Brain: No restricted diffusion to suggest acute or subacute infarct. No acute hemorrhage, mass, mass effect, or midline shift. No hemosiderin deposition to suggest remote hemorrhage. No hydrocephalus or extra-axial collection. T2 hyperintense signal in the periventricular white matter, likely the sequela of mild-to-moderate chronic small vessel ischemic disease. Mildly advanced cerebral atrophy for age.  Vascular: Normal arterial flow voids. Skull and upper cervical spine: Normal marrow signal. Sinuses/Orbits: No acute finding. Status post right lens replacement Other: The mastoids are well aerated. IMPRESSION: No acute intracranial process. No evidence of acute or subacute infarct. Electronically Signed   By: Merilyn Baba M.D.   On: 10/12/2021 23:33   CT HEAD WO CONTRAST  Result Date: 10/12/2021 CLINICAL DATA:  Syncope EXAM: CT HEAD WITHOUT CONTRAST TECHNIQUE: Contiguous axial images were obtained from the base of the skull through the vertex without intravenous contrast. RADIATION DOSE REDUCTION: This exam was performed according to the departmental dose-optimization program which includes automated exposure control, adjustment of the mA and/or kV according to patient size and/or use of iterative reconstruction technique. COMPARISON:  Chest CT dated August 20, 2021 FINDINGS: Brain: Mild chronic white matter ischemic change. No evidence of acute infarction, hemorrhage, hydrocephalus, extra-axial collection or mass lesion/mass effect. Vascular: No hyperdense vessel or unexpected calcification. Skull: Normal. Negative for fracture or focal lesion. Sinuses/Orbits: No acute finding. Other: None. IMPRESSION: No acute intracranial abnormality. Electronically Signed   By: Yetta Glassman M.D.   On: 10/12/2021 17:32   DG Chest 2 View  Result Date: 10/12/2021 CLINICAL DATA:  Syncope. EXAM: CHEST - 2 VIEW COMPARISON:  10/11/2021 FINDINGS: The heart size and mediastinal contours are within normal limits. Improved aeration at the right lung base. Residual atelectasis/consolidation at the left lung base with possible component a small left pleural effusion. No pneumothorax. Stable probable emphysematous lung disease. The visualized skeletal structures are unremarkable. IMPRESSION: Improved aeration at the right lung base with residual atelectasis/consolidation at the left lung base and possible component of left pleural  fluid. Stable emphysema. Electronically Signed   By: Aletta Edouard M.D.   On: 10/12/2021 17:15   DG Chest Port 1 View  Result Date: 10/11/2021 CLINICAL DATA:  Shortness of breath. EXAM: PORTABLE CHEST 1 VIEW COMPARISON:  October 08, 2021 FINDINGS: The heart size and mediastinal contours are within normal limits. Stable mild to moderate severity areas of atelectasis and/or infiltrate are seen within the bilateral lung bases, left greater than right. There is a small, stable left pleural effusion. No pneumothorax is identified. The visualized skeletal structures are unremarkable. IMPRESSION: 1. Stable mild to moderate severity bibasilar atelectasis and/or infiltrate. 2. Small, stable left pleural effusion. Electronically Signed   By: Virgina Norfolk M.D.   On: 10/11/2021 01:09   DG Chest 1 View  Result Date: 10/08/2021 CLINICAL DATA:  Chest pain and abdominal pain today. History of lung carcinoma. EXAM: CHEST  1 VIEW COMPARISON:  09/30/2021 and older studies.  CT, 09/30/2021. FINDINGS: Cardiac silhouette normal in size.  No mediastinal or hilar masses. Opacity at the left lung base obscures the left hemidiaphragm and portions of the left heart border. Linear opacities noted at the right lung base consistent with scarring or atelectasis. There prominent interstitial markings most evident in the lower lungs. Relative lucency in the upper lobes is consistent with emphysema. Possible small left pleural effusion. No evidence of a right pleural effusion. No pneumothorax. IMPRESSION: 1. Persistent opacity at the left lung base consistent with a combination atelectasis and/or pneumonia and a small effusion. Findings are stable from the prior chest radiograph and chest CT. Electronically Signed   By: Lajean Manes M.D.   On: 10/08/2021 13:59      Subjective: Patient seen and examined at bedside.  He is adamant about going home today.  Shortness of breath is improving.  No fever, vomiting, chest pain  reported.  Discharge Exam: Vitals:   11/04/21 0600 11/04/21 0852  BP:    Pulse:    Resp: 17   Temp:    SpO2:  96%    General: Pt is alert, awake, not in acute distress.  Looks chronically ill and deconditioned.  Currently on 3 L oxygen by nasal cannula. Cardiovascular: rate controlled, S1/S2 + Respiratory: bilateral decreased breath sounds at bases with some crackles  Abdominal: Soft, obese, NT, ND, bowel sounds + Extremities: Trace lower extremity edema; no cyanosis    The results of significant diagnostics from this hospitalization (including imaging, microbiology, ancillary and laboratory) are listed below for reference.     Microbiology: No results found for this or any previous visit (from the past 240 hour(s)).   Labs: BNP (last 3 results) Recent Labs    09/22/21 2132 10/11/21 0114 11/02/21 2005  BNP 92.2 31.0 16.6   Basic Metabolic Panel: Recent Labs  Lab 11/02/21 2005 11/03/21 0500 11/04/21 0546  NA 140 139 138  K 3.9 5.1 4.6  CL 103 105 103  CO2 27 26 26   GLUCOSE 203* 178* 290*  BUN 13 15 17   CREATININE  1.05 1.12 0.98  CALCIUM 9.2 9.0 8.9  MG  --   --  2.2   Liver Function Tests: Recent Labs  Lab 11/02/21 2005  AST 16  ALT 13  ALKPHOS 84  BILITOT 0.3  PROT 7.6  ALBUMIN 3.6   No results for input(s): "LIPASE", "AMYLASE" in the last 168 hours. No results for input(s): "AMMONIA" in the last 168 hours. CBC: Recent Labs  Lab 11/02/21 2005 11/03/21 0500 11/04/21 0546  WBC 11.7* 10.9* 18.5*  NEUTROABS 9.0*  --   --   HGB 10.9* 10.7* 10.7*  HCT 35.5* 34.6* 34.5*  MCV 83.7 83.2 83.1  PLT 386 430* 407*   Cardiac Enzymes: No results for input(s): "CKTOTAL", "CKMB", "CKMBINDEX", "TROPONINI" in the last 168 hours. BNP: Invalid input(s): "POCBNP" CBG: Recent Labs  Lab 11/03/21 0840 11/03/21 1244 11/03/21 1706 11/03/21 2033 11/04/21 0729  GLUCAP 253* 256* 232* 282* 264*   D-Dimer No results for input(s): "DDIMER" in the last 72  hours. Hgb A1c Recent Labs    11/03/21 0500  HGBA1C 7.0*   Lipid Profile No results for input(s): "CHOL", "HDL", "LDLCALC", "TRIG", "CHOLHDL", "LDLDIRECT" in the last 72 hours. Thyroid function studies No results for input(s): "TSH", "T4TOTAL", "T3FREE", "THYROIDAB" in the last 72 hours.  Invalid input(s): "FREET3" Anemia work up No results for input(s): "VITAMINB12", "FOLATE", "FERRITIN", "TIBC", "IRON", "RETICCTPCT" in the last 72 hours. Urinalysis    Component Value Date/Time   COLORURINE YELLOW 10/12/2021 2113   APPEARANCEUR CLEAR 10/12/2021 2113   LABSPEC 1.027 10/12/2021 2113   PHURINE 7.0 10/12/2021 2113   GLUCOSEU NEGATIVE 10/12/2021 2113   HGBUR NEGATIVE 10/12/2021 2113   BILIRUBINUR NEGATIVE 10/12/2021 2113   KETONESUR 5 (A) 10/12/2021 2113   PROTEINUR 30 (A) 10/12/2021 2113   UROBILINOGEN 1.0 01/12/2013 0024   NITRITE NEGATIVE 10/12/2021 2113   LEUKOCYTESUR TRACE (A) 10/12/2021 2113   Sepsis Labs Recent Labs  Lab 11/02/21 2005 11/03/21 0500 11/04/21 0546  WBC 11.7* 10.9* 18.5*   Microbiology No results found for this or any previous visit (from the past 240 hour(s)).   Time coordinating discharge: 35 minutes  SIGNED:   Aline August, MD  [facility] 11/04/2021, 10:15 AM

## 2021-11-04 NOTE — Progress Notes (Signed)
[facility] 4E [employer] [facility]) Hospital Liaison note:  Notified via [place] from Dr. Aline August of request for [place] services. Will continue to follow for disposition.  Please call with any outpatient palliative questions or concerns.  Thank you for the opportunity to participate in this patient's care.  Thank you, Lorelee Market, LPN [facility] Liaison [phone]

## 2021-11-04 NOTE — TOC Transition Note (Signed)
Transition of Care [facility]) - CM/SW Discharge Note   Patient Details  Name: Mario [name] MRN: [id number] Date of Birth: 24-Mar-1950  [facility] CM/SW Contact:  Henrietta Dine, RN Phone Number: 11/04/2021, 11:42 AM   Clinical Narrative:  d/c home; contacted [facility], SW for [facility] services, [facility] agency, home O2 with [facility]; emailed documents to [place] with confirmation; pt's PCP is Dr. Otelia Sergeant; [facility] called for transport; confirmed address; no further CM needs.    Final next level of care: Home/Self Care (boarding home) Barriers to Discharge: Continued Medical Work up   Patient Goals and CMS Choice Patient states their goals for this hospitalization and ongoing recovery are:: home CMS Medicare.gov Compare Post Acute Care list provided to:: Patient Choice offered to / list presented to : Patient  Discharge Placement                       Discharge Plan and Services   Discharge Planning Services: CM Consult Post Acute Care Choice: NA                               Social Determinants of Health (SDOH) Interventions Housing Interventions: Intervention Not Indicated Transportation Interventions: Intervention Not Indicated Utilities Interventions: Intervention Not Indicated   Readmission Risk Interventions    11/03/2021   11:50 AM 08/23/2021   11:48 AM 08/05/2021   11:02 AM  Readmission Risk Prevention Plan  Transportation Screening Complete Complete Complete  Medication Review Press photographer) Complete Complete Complete  PCP or Specialist appointment within 3-5 days of discharge Complete Complete Complete  HRI or Home Care Consult Complete Complete Complete  SW Recovery Care/Counseling Consult Complete Complete Complete  Palliative Care Screening Not Applicable Not Applicable Not [place] Not Applicable Not Applicable Not Applicable

## 2021-11-11 ENCOUNTER — Encounter (HOSPITAL_COMMUNITY): Payer: Self-pay

## 2021-11-26 ENCOUNTER — Telehealth: Payer: Self-pay

## 2021-11-26 NOTE — Telephone Encounter (Signed)
(  10:45 am) PC SW scheduled an initial palliative care visit with patient. Palliative Care RN/SW team will see patient on 12/01/21 @ 11am.

## 2021-12-01 ENCOUNTER — Other Ambulatory Visit: Payer: Medicare PPO | Admitting: *Deleted

## 2021-12-01 ENCOUNTER — Other Ambulatory Visit: Payer: Medicare PPO

## 2021-12-01 DIAGNOSIS — Z515 Encounter for palliative care: Secondary | ICD-10-CM

## 2021-12-01 NOTE — Progress Notes (Signed)
[facility] COMMUNITY PALLIATIVE CARE RN NOTE  PATIENT NAME: Mario [name] DOB: 09/14/1950 MRN: [id number]  PRIMARY CARE PROVIDER: [facility]  RESPONSIBLE PARTY: Consuela Mimes (daughter) Acct ID - Guarantor Home Phone Work Phone Relationship Acct Type  [id number] DEANTE, [phone]  Self P/F     [place], [place],  [id number]    RN/SW team arrived at patient's home for scheduled palliative care initial consult visit. There was a car present in the driveway. There was no answer at the door. [facility] folder left in patient's door.    Daryl Eastern, RN BSN

## 2021-12-14 ENCOUNTER — Inpatient Hospital Stay: Payer: No Typology Code available for payment source | Attending: Internal Medicine | Admitting: Internal Medicine

## 2022-01-03 NOTE — Progress Notes (Signed)
COMMUNITY PALLIATIVE CARE SW NOTE  PATIENT NAME: Mario [name] DOB: 08/28/1950 MRN: [id number]  PRIMARY CARE PROVIDER: [facility], [facility]  RESPONSIBLE PARTY:  Acct ID - Guarantor Home Phone Work Phone Relationship Acct Type  [id number] TURNER, BAILLIE* [phone]  Self P/F     [address], [place], [place] [address]   RN/SW team arrived at patient's home for scheduled palliative care initial consult visit. There was a car present in the driveway. There was no answer at the door. [facility] folder left in patient's door.        [address] [place], [place]

## 2022-03-09 ENCOUNTER — Telehealth: Payer: Self-pay

## 2022-03-09 NOTE — Telephone Encounter (Signed)
(  3:12 pm) PC SW left a message for patient requesting a return call to discuss services.

## 2022-06-29 ENCOUNTER — Emergency Department (HOSPITAL_COMMUNITY): Payer: No Typology Code available for payment source

## 2022-06-29 ENCOUNTER — Other Ambulatory Visit: Payer: Self-pay

## 2022-06-29 ENCOUNTER — Emergency Department (HOSPITAL_COMMUNITY)
Admission: EM | Admit: 2022-06-29 | Discharge: 2022-06-30 | Disposition: A | Discharge status: Home / Self Care | Payer: No Typology Code available for payment source | Attending: Emergency Medicine | Admitting: Emergency Medicine

## 2022-06-29 DIAGNOSIS — K5732 Diverticulitis of large intestine without perforation or abscess without bleeding: Secondary | ICD-10-CM | POA: Diagnosis not present

## 2022-06-29 DIAGNOSIS — J449 Chronic obstructive pulmonary disease, unspecified: Secondary | ICD-10-CM | POA: Insufficient documentation

## 2022-06-29 DIAGNOSIS — Z7901 Long term (current) use of anticoagulants: Secondary | ICD-10-CM | POA: Diagnosis not present

## 2022-06-29 DIAGNOSIS — Z7951 Long term (current) use of inhaled steroids: Secondary | ICD-10-CM | POA: Insufficient documentation

## 2022-06-29 DIAGNOSIS — K5900 Constipation, unspecified: Secondary | ICD-10-CM

## 2022-06-29 DIAGNOSIS — R1030 Lower abdominal pain, unspecified: Secondary | ICD-10-CM | POA: Diagnosis present

## 2022-06-29 DIAGNOSIS — K5792 Diverticulitis of intestine, part unspecified, without perforation or abscess without bleeding: Secondary | ICD-10-CM

## 2022-06-29 DIAGNOSIS — Z85118 Personal history of other malignant neoplasm of bronchus and lung: Secondary | ICD-10-CM | POA: Diagnosis not present

## 2022-06-29 LAB — URINALYSIS, ROUTINE W REFLEX MICROSCOPIC
Bilirubin Urine: NEGATIVE
Glucose, UA: NEGATIVE mg/dL
Hgb urine dipstick: NEGATIVE
Ketones, ur: NEGATIVE mg/dL
Leukocytes,Ua: NEGATIVE
Nitrite: NEGATIVE
Protein, ur: NEGATIVE mg/dL
Specific Gravity, Urine: 1.018 (ref 1.005–1.030)
pH: 7 (ref 5.0–8.0)

## 2022-06-29 LAB — CBC WITH DIFFERENTIAL/PLATELET
Abs Immature Granulocytes: 0.02 10*3/uL (ref 0.00–0.07)
Basophils Absolute: 0.1 10*3/uL (ref 0.0–0.1)
Basophils Relative: 1 %
Eosinophils Absolute: 0.3 10*3/uL (ref 0.0–0.5)
Eosinophils Relative: 3 %
HCT: 41.3 % (ref 39.0–52.0)
Hemoglobin: 13.2 g/dL (ref 13.0–17.0)
Immature Granulocytes: 0 %
Lymphocytes Relative: 19 %
Lymphs Abs: 1.9 10*3/uL (ref 0.7–4.0)
MCH: 28.6 pg (ref 26.0–34.0)
MCHC: 32 g/dL (ref 30.0–36.0)
MCV: 89.6 fL (ref 80.0–100.0)
Monocytes Absolute: 0.9 10*3/uL (ref 0.1–1.0)
Monocytes Relative: 9 %
Neutro Abs: 7.1 10*3/uL (ref 1.7–7.7)
Neutrophils Relative %: 68 %
Platelets: 341 10*3/uL (ref 150–400)
RBC: 4.61 MIL/uL (ref 4.22–5.81)
RDW: 17.7 % — ABNORMAL HIGH (ref 11.5–15.5)
WBC: 10.3 10*3/uL (ref 4.0–10.5)
nRBC: 0 % (ref 0.0–0.2)

## 2022-06-29 LAB — COMPREHENSIVE METABOLIC PANEL
ALT: 11 U/L (ref 0–44)
AST: 28 U/L (ref 15–41)
Albumin: 3.7 g/dL (ref 3.5–5.0)
Alkaline Phosphatase: 93 U/L (ref 38–126)
Anion gap: 10 (ref 5–15)
BUN: 14 mg/dL (ref 8–23)
CO2: 24 mmol/L (ref 22–32)
Calcium: 9.2 mg/dL (ref 8.9–10.3)
Chloride: 100 mmol/L (ref 98–111)
Creatinine, Ser: 0.87 mg/dL (ref 0.61–1.24)
GFR, Estimated: >60 mL/min (ref >60)
Glucose, Bld: 89 mg/dL (ref 70–99)
Potassium: 4.8 mmol/L (ref 3.5–5.1)
Sodium: 134 mmol/L — ABNORMAL LOW (ref 135–145)
Total Bilirubin: 0.9 mg/dL (ref 0.3–1.2)
Total Protein: 7.6 g/dL (ref 6.5–8.1)

## 2022-06-29 MED ORDER — AMOXICILLIN-POT CLAVULANATE 875-125 MG PO TABS
1.0000 | ORAL_TABLET | Freq: Once | ORAL | Status: AC
  Administered 2022-06-29: 1 via ORAL
  Filled 2022-06-29: qty 1

## 2022-06-29 MED ORDER — IOHEXOL 300 MG/ML  SOLN
100.0000 mL | Freq: Once | INTRAMUSCULAR | Status: AC | PRN
  Administered 2022-06-29: 100 mL via INTRAVENOUS

## 2022-06-29 MED ORDER — SENNOSIDES-DOCUSATE SODIUM 8.6-50 MG PO TABS: 1.0000 | ORAL_TABLET | Freq: Every day | ORAL | 0 refills | Status: DC

## 2022-06-29 MED ORDER — LIDOCAINE 5 % EX PTCH
1.0000 | MEDICATED_PATCH | Freq: Once | CUTANEOUS | Status: DC
  Administered 2022-06-29: 1 via TRANSDERMAL
  Filled 2022-06-29: qty 1

## 2022-06-29 MED ORDER — ACETAMINOPHEN 500 MG PO TABS
1000.0000 mg | ORAL_TABLET | Freq: Once | ORAL | Status: AC
  Administered 2022-06-29: 1000 mg via ORAL
  Filled 2022-06-29: qty 2

## 2022-06-29 MED ORDER — FLEET ENEMA 7-19 GM/118ML RE ENEM
1.0000 | ENEMA | Freq: Once | RECTAL | Status: AC
  Administered 2022-06-29: 1 via RECTAL
  Filled 2022-06-29: qty 1

## 2022-06-29 MED ORDER — AMOXICILLIN-POT CLAVULANATE 875-125 MG PO TABS: 1.0000 | ORAL_TABLET | Freq: Two times a day (BID) | ORAL | 0 refills | Status: DC

## 2022-06-29 MED ORDER — METAMUCIL SMOOTH TEXTURE 58.6 % PO POWD: 1.0000 | Freq: Three times a day (TID) | ORAL | 12 refills | Status: DC

## 2022-06-29 NOTE — ED Provider Notes (Signed)
[name] Provider Note   CSN: [id number] Arrival date & time: 06/29/22  1925     History  Chief Complaint  Patient presents with   Abdominal Pain   Constipation    Mario [name] is a 72 y.o. male.  Patient is a 72 year old male with a past medical history of stage IV lung cancer and COPD on 3 L nasal cannula at baseline, chronic constipation presenting to the emergency department with worsening constipation.  The patient states that he has been constipated over the last week and a half.  He states that he is still passing gas.  He states that he has had increasing lower abdominal pain.  He states that he feels like he has been unable to empty his bladder completely over the last day.  He denies any fevers or chills, nausea or vomiting, black or bloody stools, dysuria or hematuria.  He states that he has been taking his MiraLAX at home without significant relief.  The patient also reported that he was feeling short of breath this morning.  He states he checked his oxygen and it was 67% however he was not wearing his nasal cannula.  He states that it normalized and his breathing improved after placing his nasal cannula.  He states that he is supposed to be wearing it all the time.  The history is provided by the patient.  Abdominal Pain Associated symptoms: constipation   Constipation Associated symptoms: abdominal pain        Home Medications Prior to Admission medications   Medication Sig Start Date End Date Taking? Authorizing Provider  amoxicillin-clavulanate (AUGMENTIN) 875-125 MG tablet Take 1 tablet by mouth every 12 (twelve) hours. 06/29/22  Yes Theresia Lo, Nathan Moctezuma K, DO  psyllium (METAMUCIL SMOOTH TEXTURE) 58.6 % powder Take 1 packet by mouth 3 (three) times daily. 06/29/22  Yes Theresia Lo, [place] K, DO  senna-docusate (SENOKOT-S) 8.6-50 MG tablet Take 1 tablet by mouth daily. 06/29/22  Yes Theresia Lo, Benetta Spar K, DO  albuterol  (PROVENTIL) (2.5 MG/3ML) 0.083% nebulizer solution Inhale 3 mLs (2.5 mg total) by nebulization every 4 (four) hours as needed for wheezing or shortness of breath. Patient taking differently: Take 2.5 mg by nebulization every 6 (six) hours as needed for wheezing or shortness of breath. 08/04/20 10/02/22  Rolly Salter, MD  albuterol (VENTOLIN HFA) 108 (90 Base) MCG/ACT inhaler Inhale 2 puffs into the lungs every 4 (four) hours as needed for wheezing or shortness of breath. 11/04/21   Glade Lloyd, MD  apixaban (ELIQUIS) 5 MG TABS tablet Take 5 mg by mouth 2 (two) times daily.    [provider]  buPROPion (WELLBUTRIN XL) 300 MG 24 hr tablet Take 300 mg by mouth every morning. 02/05/20   [provider]  busPIRone (BUSPAR) 15 MG tablet Take 15 mg by mouth daily.    [provider]  Carboxymethylcellulose Sod PF 0.5 % SOLN Place 1 drop into both eyes 4 (four) times daily as needed (dry eyes).    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    [provider]  cyclobenzaprine (FLEXERIL) 10 MG tablet Take 30 mg by mouth See admin instructions. 10 mg once daily for neck, headache, and muscular lower back pain + 30 mg at bedtime    [provider]  cycloSPORINE (RESTASIS) 0.05 % ophthalmic emulsion Place 1 drop into both  eyes every 12 (twelve) hours.    [provider]  diltiazem (CARDIZEM CD) 180 MG 24 hr capsule Take 180 mg by mouth daily.    [provider]  docusate sodium (COLACE) 100 MG capsule Take 100 mg by mouth 2 (two) times daily.    [provider]  DULoxetine (CYMBALTA) 60 MG capsule Take 1 capsule (60 mg total) by mouth 2 (two) times daily. 10/04/21 11/03/21  Earney Navy, NP  fluticasone (FLONASE) 50 MCG/ACT nasal spray Place 2 sprays into both nostrils daily.     [provider]  folic acid (FOLVITE) 1  MG tablet Take 1 mg by mouth daily.    [provider]  gabapentin (NEURONTIN) 300 MG capsule Take 3 capsules (900 mg total) by mouth at bedtime. Patient taking differently: Take 300-900 mg by mouth See admin instructions. Take 1 capsule by mouth in the morning and 3 capsules at bedtime 10/04/21 11/03/21  Earney Navy, NP  guaiFENesin (MUCINEX) 600 MG 12 hr tablet Take 600 mg by mouth 2 (two) times daily as needed for cough.    [provider]  lisinopril (ZESTRIL) 20 MG tablet Take 20 mg by mouth daily.    [provider]  lurasidone (LATUDA) 40 MG TABS tablet Take 40 mg by mouth every evening. After supper    [provider]  Melatonin 3 MG CAPS Take 6 mg by mouth at bedtime as needed (sleep).    [provider]  metFORMIN (GLUCOPHAGE) 850 MG tablet Take 850 mg by mouth in the morning and at bedtime.    [provider]  methotrexate (RHEUMATREX) 2.5 MG tablet Take 15 mg by mouth once a week. Caution:Chemotherapy. Protect from light. Patient not taking: Reported on 11/03/2021    [provider]  mineral oil enema Place 1 enema rectally 2 (two) times daily as needed for severe constipation.    [provider]  Mometasone Furoate 200 MCG/ACT AERO Inhale 2 puffs into the lungs 2 (two) times daily. 11/04/21   Glade Lloyd, MD  mycophenolate (CELLCEPT) 250 MG capsule Take 1,500 mg by mouth 2 (two) times daily.    [provider]  Nutritional Supplements (ENSURE ORIGINAL) LIQD Take 1 Bottle by mouth daily as needed (in between meals).    [provider]  ondansetron (ZOFRAN-ODT) 4 MG disintegrating tablet Take 1 tablet (4 mg total) by mouth every 8 (eight) hours as needed for nausea or vomiting. Patient not taking: Reported on 11/03/2021 10/04/21   Earney Navy, NP  oxcarbazepine (TRILEPTAL) 600 MG tablet Take 300 mg by mouth at bedtime.    [provider]  oxyCODONE (OXY IR/ROXICODONE) 5 MG immediate  release tablet Take 1-2 tablets (5-10 mg total) by mouth every 4 (four) hours as needed for severe pain. Patient taking differently: Take 5 mg by mouth every 4 (four) hours as needed for severe pain. 09/13/21   Dione Booze, MD  polyethylene glycol powder [facility]) 17 GM/SCOOP powder Take 17 g by mouth daily.    [provider]  tamsulosin (FLOMAX) 0.4 MG CAPS capsule Take 0.8 mg by mouth daily.    [provider]  Tiotropium Bromide-Olodaterol 2.5-2.5 MCG/ACT AERS Inhale 2 each into the lungs every morning. 2 puffs    [provider]  traZODone (DESYREL) 50 MG tablet Take 75 mg by mouth at bedtime.    [provider]      Allergies    Demerol [meperidine], Zocor [simvastatin], Beet [beta vulgaris],  and Liver    Review of Systems   Review of Systems  Gastrointestinal:  Positive for abdominal pain and constipation.    Physical Exam Updated Vital Signs BP 139/79   Pulse 87   Temp 98.5 F (36.9 C) (Oral)   Resp 17   Ht 6\' 2"  (1.88 m)   Wt 136.1 kg   SpO2 93%   BMI 38.52 kg/m  Physical Exam Vitals and nursing note reviewed.  Constitutional:      General: He is not in acute distress.    Appearance: He is well-developed. He is obese.  HENT:     Head: Normocephalic and atraumatic.     Mouth/Throat:     Mouth: Mucous membranes are moist.     Pharynx: Oropharynx is clear.  Eyes:     Extraocular Movements: Extraocular movements intact.  Cardiovascular:     Rate and Rhythm: Normal rate.     Heart sounds: Normal heart sounds.  Pulmonary:     Effort: Pulmonary effort is normal.     Breath sounds: Normal breath sounds.  Abdominal:     General: Abdomen is flat.     Palpations: Abdomen is soft.     Tenderness: There is abdominal tenderness in the suprapubic area and left lower quadrant. There is no guarding or rebound.  Skin:    General: Skin is warm and dry.  Neurological:     General: No focal deficit present.     Mental Status: He  is alert and oriented to person, place, and time.  Psychiatric:        Mood and Affect: Mood normal.        Behavior: Behavior normal.     ED Results / Procedures / Treatments   Labs (all labs ordered are listed, but only abnormal results are displayed) Labs Reviewed  URINALYSIS, ROUTINE W REFLEX MICROSCOPIC - Abnormal; Notable for the following components:      Result Value   APPearance CLOUDY (*)    All other components within normal limits  COMPREHENSIVE METABOLIC PANEL - Abnormal; Notable for the following components:   Sodium 134 (*)    All other components within normal limits  CBC WITH DIFFERENTIAL/PLATELET - Abnormal; Notable for the following components:   RDW 17.7 (*)    All other components within normal limits    EKG EKG Interpretation  Date/Time:  Wednesday Jun 29 2022 20:05:59 EDT Ventricular Rate:  84 PR Interval:  210 QRS Duration: 121 QT Interval:  389 QTC Calculation: 460 R Axis:   8 Text Interpretation: Sinus rhythm Nonspecific intraventricular conduction delay No significant change since last tracing Confirmed by Elayne Snare ([id number]) on 06/29/2022 9:14:16 PM  Radiology CT ABDOMEN PELVIS W CONTRAST  Result Date: 06/29/2022 CLINICAL DATA:  Constipation and abdominal pain. Bowel obstruction suspected. EXAM: CT ABDOMEN AND PELVIS WITH CONTRAST TECHNIQUE: Multidetector CT imaging of the abdomen and pelvis was performed using the standard protocol following bolus administration of intravenous contrast. RADIATION DOSE REDUCTION: This exam was performed according to the departmental dose-optimization program which includes automated exposure control, adjustment of the mA and/or kV according to patient size and/or use of iterative reconstruction technique. CONTRAST:  OMNIPAQUE IOHEXOL 300 MG/ML  SOLN COMPARISON:  CTA chest 11/02/2021, CT chest, abdomen and pelvis with contrast 09/29/2021 FINDINGS: Lower chest: Lung bases are emphysematous with asymmetric  bullous disease in the right lower lobe. There is small loculated left pleural effusion which is smaller than on 11/02/2021, but there is increased airspace consolidation  in the left-greater-than-right lower lobes concerning for pneumonia or aspiration. There are coarse scarring changes in the lingula and right middle lobe bases. The cardiac size is normal and there is patchy three-vessel coronary artery calcification again shown. There is mild chronic elevation of the posterior right hemidiaphragm. Hepatobiliary: The gallbladder and bile ducts are unremarkable. The liver enhances homogeneously except for a chronic 1.7 cm cyst of 7 Hounsfield units in the caudate hepatic lobe. Pancreas: No abnormality. Spleen: No abnormality.  No splenomegaly. Adrenals/Urinary Tract: There is no adrenal mass. There is a 9 mm simple cyst in the superior pole of the right kidney. There is a 2.6 cm simple cyst laterally in the mid right kidney, Hounsfield density is 16.7. No follow-up imaging is recommended. There is a simple cyst in the posterior left kidney of 1.8 cm, Hounsfield density of 16. No follow-up imaging is recommended. There is no renal mass enhancement, stones or hydronephrosis and no bladder thickening. Stomach/Bowel: Unremarkable gastric wall and unopacified small bowel. Again noted is a high-riding mobile cecum with the ileocecal junction lateral foot lower. An appendix is not seen in this patient. There is moderate to severe retained stool. Advanced sigmoid diverticulosis. There are faint inflammatory stranding changes alongside the mid third of the sigmoid colon consistent with a mild acute diverticulitis. No free air or diverticular abscess are seen. The rectal wall and remainder of the colon wall are unremarkable. Vascular/Lymphatic: Aortic atherosclerosis. No enlarged abdominal or pelvic lymph nodes. Reproductive: Prostatomegaly.  Prostate transverse axis 5.7 cm. Other: Small umbilical and left inguinal fat  hernias. Small supraumbilical midline hernia. A small bowel loop extends up to but not through this defect. There is no incarcerated hernia. There is no free air, free hemorrhage or free fluid. There is abdominal wall laxity with bilateral bulging laterally. Musculoskeletal: Chronic displaced fractures again noted of the left lateral eighth and ninth ribs. Ankylosis right SI joint. Vertebral body and posterior element ankylosis L4-5. Degenerative changes of the spine. No acute or other significant osseous findings. Bridging syndesmophytes thoracic spine. IMPRESSION: 1. Advanced sigmoid diverticulosis with faint inflammatory stranding changes alongside the mid third of the sigmoid colon consistent with a mild acute diverticulitis. No free air or diverticular abscess. 2. Constipation and diverticulosis, with moderate to severe fecal stasis. 3. No small bowel obstruction or inflammation. High riding mobile cecum. 4. Small loculated left pleural effusion, smaller than on 11/02/2021. 5. Increased airspace consolidation in the left-greater-than-right lower lobes concerning for pneumonia or aspiration. 6. Emphysema. 7. Aortic and coronary artery atherosclerosis. 8. Prostatomegaly. 9. Small umbilical and left inguinal fat hernias. 10. Chronic displaced fractures of the left lateral eighth and ninth ribs. Aortic Atherosclerosis (ICD10-I70.0) and Emphysema (ICD10-J43.9). Electronically Signed   By: Almira Bar M.D.   On: 06/29/2022 22:31   DG Chest 1 View  Result Date: 06/29/2022 CLINICAL DATA:  History of lung cancer. Increasing shortness of breath. EXAM: CHEST  1 VIEW COMPARISON:  Radiographs and CT 11/02/2021 FINDINGS: Stable cardiomediastinal silhouette. Bibasilar airspace and interstitial opacities. Small bilateral pleural effusions. No pneumothorax. No acute osseous abnormality. IMPRESSION: 1. Bibasilar airspace and interstitial opacities favored to represent superimposed infection or edema on a background of  emphysema. 2. Small bilateral pleural effusions. Electronically Signed   By: Minerva Fester M.D.   On: 06/29/2022 20:30    Procedures Procedures    Medications Ordered in ED Medications  lidocaine (LIDODERM) 5 % 1-3 patch (1 patch Transdermal Patch Applied 06/29/22 2038)  acetaminophen (TYLENOL) tablet 1,000 mg (1,000  mg Oral Given 06/29/22 2038)  iohexol (OMNIPAQUE) 300 MG/ML solution 100 mL (100 mLs Intravenous Contrast Given 06/29/22 2152)  sodium phosphate (FLEET) 7-19 GM/118ML enema 1 enema (1 enema Rectal Given 06/29/22 2316)  amoxicillin-clavulanate (AUGMENTIN) 875-125 MG per tablet 1 tablet (1 tablet Oral Given 06/29/22 2316)    ED Course/ Medical Decision Making/ A&P Clinical Course as of 06/29/22 2333  Wed Jun 29, 2022  2115 Bladder scan with 224 mL urine [VK]  2234 CTAP with evidence of diverticulitis as well as moderate constipation. He will be given antibiotics and discuss enema vs disimpaction.  [VK]    Clinical Course User Index [VK] Rexford Maus, DO                             Medical Decision Making This patient presents to the ED with chief complaint(s) of constipation, abd pain with pertinent past medical history of lung cancer, COPD, chronic constipation which further complicates the presenting complaint. The complaint involves an extensive differential diagnosis and also carries with it a high risk of complications and morbidity.    The differential diagnosis includes constipation, bowel obstruction, urinary retention, UTI, diverticulitis, other intra-abdominal infection, shortness of breath is likely related to hypoxia without his nasal cannula but considering pneumonia, pneumothorax, pulmonary edema, pleural effusion, ACS, anemia  Additional history obtained: Additional history obtained from N/A Records reviewed Care Everywhere/External Records  ED Course and Reassessment: On patient's arrival to the emergency department he is hemodynamically stable no acute  distress.  He is satting well on his home nasal cannula without any wheezing on exam.  Patient will have EKG, labs and chest x-ray as well as CT abdomen pelvis to evaluate for causes of his abdominal pain and constipation.  He will bladder scan performed.  He will be closely reassessed.    Independent labs interpretation:  The following labs were independently interpreted: Within normal range  Independent visualization of imaging: - I independently visualized the following imaging with scope of interpretation limited to determining acute life threatening conditions related to emergency care: CT AP, which revealed diverticulitis with constipation  Consultation: - Consulted or discussed management/test interpretation w/ external professional: N/A     Amount and/or Complexity of Data Reviewed Labs: ordered. Radiology: ordered.  Risk OTC drugs. Prescription drug management.          Final Clinical Impression(s) / ED Diagnoses Final diagnoses:  Diverticulitis  Constipation, unspecified constipation type    Rx / DC Orders ED Discharge Orders          Ordered    amoxicillin-clavulanate (AUGMENTIN) 875-125 MG tablet  Every 12 hours        06/29/22 2332    psyllium (METAMUCIL SMOOTH TEXTURE) 58.6 % powder  3 times daily        06/29/22 2332    senna-docusate (SENOKOT-S) 8.6-50 MG tablet  Daily        06/29/22 2332              Rexford Maus, DO 06/29/22 2333

## 2022-06-29 NOTE — ED Triage Notes (Addendum)
BIB [facility] from home c/o generalized abdominal pain and constipation. Constipation x1.5 weeks, last BM was last week and states that he had to "force it". Takes Miralax daily currently. Got a prescription for mag citrate and used it twice last week but it did not help.  Pt also states that he has been having issues with his SpO2 all day today. Pt is on 3L O2 at all times - uses very long extension tubing at home. Worsening SOB with exertion today. Hx of stage 4 lung cancer, no treatment currently. [facility] [place] coming [facility].  Pt states that he feels like he has to urinate but cannot. Last time he urinated was this AM. Pt also c/o worsening lower back pain the past month or so.  EMS vitals: 124/64, HR 90, SpO2 93% on 3L, CBG 102, RR 22

## 2022-06-29 NOTE — Discharge Instructions (Addendum)
You were seen in the emergency department for abdominal pain and constipation.  Your workup did show that you have diverticulitis in addition to your constipation.  I have given you prescription for antibiotics and you should complete these as prescribed.  We gave you an enema in the emergency department and you should continue to take your MiraLAX every day until you are having regular soft bowel movements, then you can cut the dose in half until you are again having regular soft bowel movements daily and then can take it as needed.  I have also given you a stool softener and a fiber supplement they should take daily as well.  You can follow-up with your primary doctor in the next 2 days to have your symptoms rechecked.  You should return to the emergency department if you are having fevers despite the antibiotics, worsening abdominal pain, repetitive vomiting or any other new or concerning symptoms.

## 2022-06-30 MED ORDER — AZITHROMYCIN 250 MG PO TABS: 250.0000 mg | ORAL_TABLET | Freq: Every day | ORAL | 0 refills | Status: DC

## 2022-06-30 NOTE — ED Notes (Signed)
Pt had large BM after manual disimpaction. States that he is feeling slightly better. Provider updated.

## 2022-06-30 NOTE — ED Notes (Signed)
[facility] called for patient transport back home at this time.

## 2022-06-30 NOTE — ED Provider Notes (Addendum)
  Provider Note MRN:  [id number]  Arrival date & time: 06/30/22    ED Course and Medical Decision Making  Assumed care from Dr Theresia Lo at shift change.  See note from prior team for complete details, in brief:  72 yo male Here w/ constipation Ct shows mild diverticulitis w/o perf or abscess Labs stable   Plan per prior physician f/u labs and imaging  Had large bm on recheck Feeling better Start oral abx for diverticulitis, f/u w/ gi Bowel regimen for home No cough/fever, no change to baseline breathing, low susp for pna however given risk factors will also cover with azithromycin in addition to augmentin for diverticulitis rx by prior team F/u with pcp  The patient improved significantly and was discharged in stable condition. Detailed discussions were had with the patient regarding current findings, and need for close f/u with PCP or on call doctor. The patient has been instructed to return immediately if the symptoms worsen in any way for re-evaluation. Patient verbalized understanding and is in agreement with current care plan. All questions answered prior to discharge.    Procedures  Final Clinical Impressions(s) / ED Diagnoses     ICD-10-CM   1. Diverticulitis  K57.92     2. Constipation, unspecified constipation type  K59.00       ED Discharge Orders          Ordered    amoxicillin-clavulanate (AUGMENTIN) 875-125 MG tablet  Every 12 hours        06/29/22 2332    psyllium (METAMUCIL SMOOTH TEXTURE) 58.6 % powder  3 times daily        06/29/22 2332    senna-docusate (SENOKOT-S) 8.6-50 MG tablet  Daily        06/29/22 2332              Discharge Instructions      You were seen in the emergency department for abdominal pain and constipation.  Your workup did show that you have diverticulitis in addition to your constipation.  I have given you prescription for antibiotics and you should complete these as prescribed.  We gave you an enema in the emergency  department and you should continue to take your MiraLAX every day until you are having regular soft bowel movements, then you can cut the dose in half until you are again having regular soft bowel movements daily and then can take it as needed.  I have also given you a stool softener and a fiber supplement they should take daily as well.  You can follow-up with your primary doctor in the next 2 days to have your symptoms rechecked.  You should return to the emergency department if you are having fevers despite the antibiotics, worsening abdominal pain, repetitive vomiting or any other new or concerning symptoms.       Sloan Leiter, DO 06/30/22 0114    Sloan Leiter, DO 06/30/22 [phone]

## 2022-07-02 ENCOUNTER — Emergency Department (HOSPITAL_COMMUNITY): Payer: Medicare PPO

## 2022-07-02 ENCOUNTER — Other Ambulatory Visit: Payer: Self-pay

## 2022-07-02 ENCOUNTER — Inpatient Hospital Stay (HOSPITAL_COMMUNITY)
Admission: EM | Admit: 2022-07-02 | Discharge: 2022-07-05 | DRG: 194 | Disposition: A | Discharge status: Home / Self Care | Payer: Medicare PPO | Attending: Internal Medicine | Admitting: Internal Medicine

## 2022-07-02 ENCOUNTER — Encounter (HOSPITAL_COMMUNITY): Payer: Self-pay

## 2022-07-02 DIAGNOSIS — Z888 Allergy status to other drugs, medicaments and biological substances status: Secondary | ICD-10-CM

## 2022-07-02 DIAGNOSIS — N1831 Chronic kidney disease, stage 3a: Secondary | ICD-10-CM | POA: Diagnosis present

## 2022-07-02 DIAGNOSIS — Z8679 Personal history of other diseases of the circulatory system: Secondary | ICD-10-CM

## 2022-07-02 DIAGNOSIS — E1122 Type 2 diabetes mellitus with diabetic chronic kidney disease: Secondary | ICD-10-CM | POA: Diagnosis present

## 2022-07-02 DIAGNOSIS — Z923 Personal history of irradiation: Secondary | ICD-10-CM

## 2022-07-02 DIAGNOSIS — J189 Pneumonia, unspecified organism: Principal | ICD-10-CM | POA: Diagnosis present

## 2022-07-02 DIAGNOSIS — C799 Secondary malignant neoplasm of unspecified site: Secondary | ICD-10-CM | POA: Diagnosis present

## 2022-07-02 DIAGNOSIS — Z9981 Dependence on supplemental oxygen: Secondary | ICD-10-CM

## 2022-07-02 DIAGNOSIS — I2699 Other pulmonary embolism without acute cor pulmonale: Secondary | ICD-10-CM

## 2022-07-02 DIAGNOSIS — F419 Anxiety disorder, unspecified: Secondary | ICD-10-CM | POA: Diagnosis present

## 2022-07-02 DIAGNOSIS — N400 Enlarged prostate without lower urinary tract symptoms: Secondary | ICD-10-CM | POA: Diagnosis present

## 2022-07-02 DIAGNOSIS — M545 Low back pain, unspecified: Secondary | ICD-10-CM | POA: Diagnosis present

## 2022-07-02 DIAGNOSIS — C3492 Malignant neoplasm of unspecified part of left bronchus or lung: Secondary | ICD-10-CM | POA: Diagnosis present

## 2022-07-02 DIAGNOSIS — J449 Chronic obstructive pulmonary disease, unspecified: Secondary | ICD-10-CM | POA: Diagnosis present

## 2022-07-02 DIAGNOSIS — Z86711 Personal history of pulmonary embolism: Secondary | ICD-10-CM

## 2022-07-02 DIAGNOSIS — G4733 Obstructive sleep apnea (adult) (pediatric): Secondary | ICD-10-CM | POA: Diagnosis present

## 2022-07-02 DIAGNOSIS — Z7984 Long term (current) use of oral hypoglycemic drugs: Secondary | ICD-10-CM

## 2022-07-02 DIAGNOSIS — K5900 Constipation, unspecified: Secondary | ICD-10-CM | POA: Diagnosis present

## 2022-07-02 DIAGNOSIS — Z85828 Personal history of other malignant neoplasm of skin: Secondary | ICD-10-CM

## 2022-07-02 DIAGNOSIS — I10 Essential (primary) hypertension: Secondary | ICD-10-CM | POA: Diagnosis present

## 2022-07-02 DIAGNOSIS — Z5982 Transportation insecurity: Secondary | ICD-10-CM

## 2022-07-02 DIAGNOSIS — R0602 Shortness of breath: Secondary | ICD-10-CM | POA: Diagnosis not present

## 2022-07-02 DIAGNOSIS — G894 Chronic pain syndrome: Secondary | ICD-10-CM | POA: Diagnosis present

## 2022-07-02 DIAGNOSIS — Z6838 Body mass index (BMI) 38.0-38.9, adult: Secondary | ICD-10-CM

## 2022-07-02 DIAGNOSIS — E669 Obesity, unspecified: Secondary | ICD-10-CM | POA: Diagnosis present

## 2022-07-02 DIAGNOSIS — Z87891 Personal history of nicotine dependence: Secondary | ICD-10-CM

## 2022-07-02 DIAGNOSIS — J9621 Acute and chronic respiratory failure with hypoxia: Secondary | ICD-10-CM | POA: Diagnosis not present

## 2022-07-02 DIAGNOSIS — J9611 Chronic respiratory failure with hypoxia: Secondary | ICD-10-CM | POA: Diagnosis present

## 2022-07-02 DIAGNOSIS — I129 Hypertensive chronic kidney disease with stage 1 through stage 4 chronic kidney disease, or unspecified chronic kidney disease: Secondary | ICD-10-CM | POA: Diagnosis present

## 2022-07-02 DIAGNOSIS — Z7901 Long term (current) use of anticoagulants: Secondary | ICD-10-CM

## 2022-07-02 DIAGNOSIS — Z9151 Personal history of suicidal behavior: Secondary | ICD-10-CM

## 2022-07-02 DIAGNOSIS — F339 Major depressive disorder, recurrent, unspecified: Secondary | ICD-10-CM | POA: Diagnosis present

## 2022-07-02 DIAGNOSIS — I252 Old myocardial infarction: Secondary | ICD-10-CM

## 2022-07-02 DIAGNOSIS — Z881 Allergy status to other antibiotic agents status: Secondary | ICD-10-CM

## 2022-07-02 DIAGNOSIS — I2510 Atherosclerotic heart disease of native coronary artery without angina pectoris: Secondary | ICD-10-CM | POA: Diagnosis present

## 2022-07-02 DIAGNOSIS — F32A Depression, unspecified: Secondary | ICD-10-CM | POA: Diagnosis present

## 2022-07-02 DIAGNOSIS — G8929 Other chronic pain: Secondary | ICD-10-CM

## 2022-07-02 DIAGNOSIS — Z1152 Encounter for screening for COVID-19: Secondary | ICD-10-CM

## 2022-07-02 DIAGNOSIS — J440 Chronic obstructive pulmonary disease with acute lower respiratory infection: Secondary | ICD-10-CM | POA: Diagnosis present

## 2022-07-02 DIAGNOSIS — Z79899 Other long term (current) drug therapy: Secondary | ICD-10-CM

## 2022-07-02 DIAGNOSIS — I776 Arteritis, unspecified: Secondary | ICD-10-CM

## 2022-07-02 DIAGNOSIS — E119 Type 2 diabetes mellitus without complications: Secondary | ICD-10-CM

## 2022-07-02 HISTORY — DX: Other pulmonary embolism without acute cor pulmonale: I26.99

## 2022-07-02 LAB — CBC WITH DIFFERENTIAL/PLATELET
Abs Immature Granulocytes: 0.12 10*3/uL — ABNORMAL HIGH (ref 0.00–0.07)
Basophils Absolute: 0.1 10*3/uL (ref 0.0–0.1)
Basophils Relative: 1 %
Eosinophils Absolute: 0.2 10*3/uL (ref 0.0–0.5)
Eosinophils Relative: 3 %
HCT: 38 % — ABNORMAL LOW (ref 39.0–52.0)
Hemoglobin: 12.3 g/dL — ABNORMAL LOW (ref 13.0–17.0)
Immature Granulocytes: 2 %
Lymphocytes Relative: 18 %
Lymphs Abs: 1.5 10*3/uL (ref 0.7–4.0)
MCH: 29.4 pg (ref 26.0–34.0)
MCHC: 32.4 g/dL (ref 30.0–36.0)
MCV: 90.9 fL (ref 80.0–100.0)
Monocytes Absolute: 0.7 10*3/uL (ref 0.1–1.0)
Monocytes Relative: 9 %
Neutro Abs: 5.6 10*3/uL (ref 1.7–7.7)
Neutrophils Relative %: 67 %
Platelets: 331 10*3/uL (ref 150–400)
RBC: 4.18 MIL/uL — ABNORMAL LOW (ref 4.22–5.81)
RDW: 17.5 % — ABNORMAL HIGH (ref 11.5–15.5)
WBC: 8.2 10*3/uL (ref 4.0–10.5)
nRBC: 0 % (ref 0.0–0.2)

## 2022-07-02 LAB — COMPREHENSIVE METABOLIC PANEL
ALT: 17 U/L (ref 0–44)
AST: 20 U/L (ref 15–41)
Albumin: 3.4 g/dL — ABNORMAL LOW (ref 3.5–5.0)
Alkaline Phosphatase: 91 U/L (ref 38–126)
Anion gap: 11 (ref 5–15)
BUN: 14 mg/dL (ref 8–23)
CO2: 23 mmol/L (ref 22–32)
Calcium: 8.4 mg/dL — ABNORMAL LOW (ref 8.9–10.3)
Chloride: 101 mmol/L (ref 98–111)
Creatinine, Ser: 1.03 mg/dL (ref 0.61–1.24)
GFR, Estimated: >60 mL/min (ref >60)
Glucose, Bld: 161 mg/dL — ABNORMAL HIGH (ref 70–99)
Potassium: 4 mmol/L (ref 3.5–5.1)
Sodium: 135 mmol/L (ref 135–145)
Total Bilirubin: 0.5 mg/dL (ref 0.3–1.2)
Total Protein: 6.9 g/dL (ref 6.5–8.1)

## 2022-07-02 LAB — RESP PANEL BY RT-PCR (RSV, FLU A&B, COVID)  RVPGX2
Influenza A by PCR: NEGATIVE
Influenza B by PCR: NEGATIVE
Resp Syncytial Virus by PCR: NEGATIVE
SARS Coronavirus 2 by RT PCR: NEGATIVE

## 2022-07-02 LAB — TROPONIN I (HIGH SENSITIVITY)
Troponin I (High Sensitivity): 5 ng/L (ref <18)
Troponin I (High Sensitivity): 5 ng/L (ref <18)

## 2022-07-02 LAB — TSH: TSH: 2.195 u[IU]/mL (ref 0.350–4.500)

## 2022-07-02 LAB — PROCALCITONIN: Procalcitonin: <0.1 ng/mL

## 2022-07-02 LAB — BRAIN NATRIURETIC PEPTIDE: B Natriuretic Peptide: 20.1 pg/mL (ref 0.0–100.0)

## 2022-07-02 LAB — LACTIC ACID, PLASMA: Lactic Acid, Venous: 1.8 mmol/L (ref 0.5–1.9)

## 2022-07-02 LAB — VITAMIN B12: Vitamin B-12: 444 pg/mL (ref 180–914)

## 2022-07-02 MED ORDER — SODIUM CHLORIDE 0.9 % IV SOLN
1.0000 g | Freq: Once | INTRAVENOUS | Status: AC
  Administered 2022-07-02: 1 g via INTRAVENOUS
  Filled 2022-07-02: qty 10

## 2022-07-02 MED ORDER — ENSURE ENLIVE PO LIQD: 1.0000 | Freq: Every day | ORAL | Status: DC | PRN

## 2022-07-02 MED ORDER — ACETAMINOPHEN 650 MG RE SUPP: 650.0000 mg | Freq: Four times a day (QID) | RECTAL | Status: DC | PRN

## 2022-07-02 MED ORDER — SODIUM CHLORIDE (PF) 0.9 % IJ SOLN
INTRAMUSCULAR | Status: AC
  Filled 2022-07-02: qty 50

## 2022-07-02 MED ORDER — SENNOSIDES-DOCUSATE SODIUM 8.6-50 MG PO TABS
1.0000 | ORAL_TABLET | Freq: Every day | ORAL | Status: DC
  Filled 2022-07-02: qty 1

## 2022-07-02 MED ORDER — UMECLIDINIUM BROMIDE 62.5 MCG/ACT IN AEPB
1.0000 | INHALATION_SPRAY | Freq: Every day | RESPIRATORY_TRACT | Status: DC
  Administered 2022-07-03 – 2022-07-05 (×3): 1 via RESPIRATORY_TRACT
  Filled 2022-07-02: qty 7

## 2022-07-02 MED ORDER — GABAPENTIN 300 MG PO CAPS: 300.0000 mg | ORAL_CAPSULE | Freq: Every day | ORAL | Status: DC

## 2022-07-02 MED ORDER — ENOXAPARIN SODIUM 60 MG/0.6ML IJ SOSY
60.0000 mg | PREFILLED_SYRINGE | INTRAMUSCULAR | Status: DC
  Administered 2022-07-02: 60 mg via SUBCUTANEOUS
  Filled 2022-07-02 (×2): qty 0.6

## 2022-07-02 MED ORDER — IPRATROPIUM-ALBUTEROL 0.5-2.5 (3) MG/3ML IN SOLN
3.0000 mL | Freq: Four times a day (QID) | RESPIRATORY_TRACT | Status: AC
  Administered 2022-07-02: 3 mL via RESPIRATORY_TRACT
  Filled 2022-07-02 (×2): qty 3

## 2022-07-02 MED ORDER — LURASIDONE HCL 40 MG PO TABS
40.0000 mg | ORAL_TABLET | Freq: Every evening | ORAL | Status: DC
  Filled 2022-07-02: qty 1

## 2022-07-02 MED ORDER — CYCLOSPORINE 0.05 % OP EMUL
1.0000 [drp] | Freq: Two times a day (BID) | OPHTHALMIC | Status: DC
  Administered 2022-07-02 – 2022-07-05 (×6): 1 [drp] via OPHTHALMIC
  Filled 2022-07-02 (×6): qty 30

## 2022-07-02 MED ORDER — SODIUM CHLORIDE 0.9 % IV SOLN
500.0000 mg | Freq: Once | INTRAVENOUS | Status: DC
  Filled 2022-07-02: qty 5

## 2022-07-02 MED ORDER — OXCARBAZEPINE 300 MG PO TABS
600.0000 mg | ORAL_TABLET | Freq: Three times a day (TID) | ORAL | Status: DC
  Administered 2022-07-02 – 2022-07-05 (×8): 600 mg via ORAL
  Filled 2022-07-02 (×8): qty 2

## 2022-07-02 MED ORDER — ALBUTEROL SULFATE (2.5 MG/3ML) 0.083% IN NEBU: 2.5000 mg | INHALATION_SOLUTION | Freq: Four times a day (QID) | RESPIRATORY_TRACT | Status: DC | PRN

## 2022-07-02 MED ORDER — INSULIN ASPART 100 UNIT/ML IJ SOLN
0.0000 [IU] | Freq: Three times a day (TID) | INTRAMUSCULAR | Status: DC
  Administered 2022-07-03: 3 [IU] via SUBCUTANEOUS
  Administered 2022-07-04: 2 [IU] via SUBCUTANEOUS
  Filled 2022-07-02: qty 0.15

## 2022-07-02 MED ORDER — IOHEXOL 350 MG/ML SOLN
100.0000 mL | Freq: Once | INTRAVENOUS | Status: AC | PRN
  Administered 2022-07-02: 100 mL via INTRAVENOUS

## 2022-07-02 MED ORDER — ARFORMOTEROL TARTRATE 15 MCG/2ML IN NEBU
15.0000 ug | INHALATION_SOLUTION | Freq: Two times a day (BID) | RESPIRATORY_TRACT | Status: DC
  Administered 2022-07-02 – 2022-07-05 (×6): 15 ug via RESPIRATORY_TRACT
  Filled 2022-07-02 (×6): qty 2

## 2022-07-02 MED ORDER — SODIUM CHLORIDE 0.9 % IV SOLN
1.0000 g | INTRAVENOUS | Status: DC
  Administered 2022-07-03: 1 g via INTRAVENOUS
  Filled 2022-07-02: qty 10

## 2022-07-02 MED ORDER — ONDANSETRON HCL 4 MG/2ML IJ SOLN: 4.0000 mg | Freq: Four times a day (QID) | INTRAMUSCULAR | Status: DC | PRN

## 2022-07-02 MED ORDER — SODIUM CHLORIDE 0.9 % IV SOLN
500.0000 mg | INTRAVENOUS | Status: DC
  Administered 2022-07-02 – 2022-07-03 (×2): 500 mg via INTRAVENOUS
  Filled 2022-07-02 (×3): qty 5

## 2022-07-02 MED ORDER — IPRATROPIUM-ALBUTEROL 0.5-2.5 (3) MG/3ML IN SOLN: 3.0000 mL | Freq: Four times a day (QID) | RESPIRATORY_TRACT | Status: DC | PRN

## 2022-07-02 MED ORDER — TAMSULOSIN HCL 0.4 MG PO CAPS
0.8000 mg | ORAL_CAPSULE | Freq: Every day | ORAL | Status: DC
  Administered 2022-07-03 – 2022-07-05 (×3): 0.8 mg via ORAL
  Filled 2022-07-02 (×3): qty 2

## 2022-07-02 MED ORDER — ONDANSETRON HCL 4 MG PO TABS: 4.0000 mg | ORAL_TABLET | Freq: Four times a day (QID) | ORAL | Status: DC | PRN

## 2022-07-02 MED ORDER — DOCUSATE SODIUM 100 MG PO CAPS
100.0000 mg | ORAL_CAPSULE | Freq: Two times a day (BID) | ORAL | Status: DC
  Administered 2022-07-02 – 2022-07-05 (×6): 100 mg via ORAL
  Filled 2022-07-02 (×6): qty 1

## 2022-07-02 MED ORDER — TRAZODONE HCL 50 MG PO TABS
75.0000 mg | ORAL_TABLET | Freq: Every day | ORAL | Status: DC
  Administered 2022-07-02 – 2022-07-04 (×3): 75 mg via ORAL
  Filled 2022-07-02 (×3): qty 2

## 2022-07-02 MED ORDER — ACETAMINOPHEN 325 MG PO TABS
650.0000 mg | ORAL_TABLET | Freq: Four times a day (QID) | ORAL | Status: DC | PRN
  Administered 2022-07-03 – 2022-07-04 (×3): 650 mg via ORAL
  Filled 2022-07-02 (×3): qty 2

## 2022-07-02 MED ORDER — GABAPENTIN 300 MG PO CAPS: 900.0000 mg | ORAL_CAPSULE | Freq: Every day | ORAL | Status: DC

## 2022-07-02 NOTE — Assessment & Plan Note (Addendum)
States he stopped his eliquis on his own. Had been on it longer than 6 months after PE diagnosis.  Nothing mentioned in his OV at [place] recently  CTA with no PE here today  VTE ppx

## 2022-07-02 NOTE — ED Notes (Signed)
Patient transported to CT 

## 2022-07-02 NOTE — ED Notes (Signed)
IV attempt x 2-unsuccessful. CT tech arrived to take pt to CT and stated she will start his IV at CT.

## 2022-07-02 NOTE — Assessment & Plan Note (Addendum)
Last A1C 6.3 in 05/2022 SSI and accuchecks qac/hs Hold metformin inpatient

## 2022-07-02 NOTE — Assessment & Plan Note (Signed)
Stable, continue to monitor  ?

## 2022-07-02 NOTE — Assessment & Plan Note (Addendum)
Followed by Dr. [name], but he went to [facility] for second opinion.  [facility] did not do chemo, but did think that KRAS G12C therapy should be offered since he could not have first line therapy or immune therapy. He was not able to get this. Hoping he can get through the [place]  Has appointment with oncology at [facility] coming up to discuss this

## 2022-07-02 NOTE — Assessment & Plan Note (Addendum)
Looking at [place] looks like he may no longer be on wellbutrin, latuda or buspar Continue cymbalta. Waiting fax from [place] to verify medication. He is unsure.

## 2022-07-02 NOTE — ED Provider Notes (Signed)
[place] EMERGENCY DEPARTMENT AT [facility] Provider Note   CSN: [id number] Arrival date & time: 07/02/22  1530     History  Chief Complaint  Patient presents with   Shortness of Breath    Mario [name] is a 72 y.o. male.  HPI   72 year old male with medical history significant for hypertension, anxiety, depression, prior suicide attempt, COPD, chronic hypoxic respiratory failure on 3 L O2, metastatic lung cancer not currently on any treatment who presents to the emergency department with shortness of breath and hypoxia.  The patient noticed at home that his O2 sats were dropping down to 69% on ambulation over the last 2 days.  He endorses pleuritic chest discomfort.  He states that [facility] his [facility].  He states that he missed an outpatient appointments due to transportation issues.  He endorses sharp substernal pleuritic discomfort.  He states that he is no longer taking his Eliquis "because I felt that I had been on it long enough."  He denies any fevers, chills, productive cough.  He was seen in the emergency department 2 days ago and diagnosed with diverticulitis however he had not started any antibiotic therapy because "my medications had not showed up in the mail."  He states that he has been inconsistently moving his bowels.  He states that he was constipated and was seen in the emergency department for this 2 days ago.  He states that he is moving gas with less frequency.  Home Medications Prior to Admission medications   Medication Sig Start Date End Date Taking? Authorizing Provider  albuterol (PROVENTIL) (2.5 MG/3ML) 0.083% nebulizer solution Inhale 3 mLs (2.5 mg total) by nebulization every 4 (four) hours as needed for wheezing or shortness of breath. Patient taking differently: Take 2.5 mg by nebulization every 6 (six) hours as needed for wheezing or shortness of breath.  08/04/20 10/02/22  Rolly Salter, MD  albuterol (VENTOLIN HFA) 108 (90 Base) MCG/ACT inhaler Inhale 2 puffs into the lungs every 4 (four) hours as needed for wheezing or shortness of breath. 11/04/21   Glade Lloyd, MD  amoxicillin-clavulanate (AUGMENTIN) 875-125 MG tablet Take 1 tablet by mouth every 12 (twelve) hours. 06/29/22   Rexford Maus, DO  apixaban (ELIQUIS) 5 MG TABS tablet Take 5 mg by mouth 2 (two) times daily.    [provider]  azithromycin (ZITHROMAX) 250 MG tablet Take 1 tablet (250 mg total) by mouth daily. Take first 2 tablets together, then 1 every day until finished. 06/30/22   Sloan Leiter, DO  buPROPion (WELLBUTRIN XL) 300 MG 24 hr tablet Take 300 mg by mouth every morning. 02/05/20   [provider]  busPIRone (BUSPAR) 15 MG tablet Take 15 mg by mouth daily.    [provider]  Carboxymethylcellulose Sod PF 0.5 % SOLN Place 1 drop into both eyes 4 (four) times daily as needed (dry eyes).    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    [provider]  cyclobenzaprine (FLEXERIL) 10 MG tablet Take 30 mg by mouth See admin instructions. 10 mg once daily for neck, headache, and muscular lower back pain + 30 mg at bedtime    [provider]  cycloSPORINE (RESTASIS) 0.05 % ophthalmic emulsion Place 1 drop into both eyes every 12 (  twelve) hours.    [provider]  diltiazem (CARDIZEM CD) 180 MG 24 hr capsule Take 180 mg by mouth daily.    [provider]  docusate sodium (COLACE) 100 MG capsule Take 100 mg by mouth 2 (two) times daily.    [provider]  DULoxetine (CYMBALTA) 60 MG capsule Take 1 capsule (60 mg total) by mouth 2 (two) times daily. 10/04/21 11/03/21  Earney Navy, NP  fluticasone (FLONASE) 50 MCG/ACT nasal spray Place 2 sprays into both nostrils daily.     [provider]  folic acid (FOLVITE) 1 MG tablet Take 1 mg by mouth daily.    [provider]  gabapentin (NEURONTIN) 300 MG capsule Take 3 capsules (900 mg total) by mouth at bedtime. Patient taking differently: Take 300-900 mg by mouth See admin instructions. Take 1 capsule by mouth in the morning and 3 capsules at bedtime 10/04/21 11/03/21  Earney Navy, NP  guaiFENesin (MUCINEX) 600 MG 12 hr tablet Take 600 mg by mouth 2 (two) times daily as needed for cough.    [provider]  lisinopril (ZESTRIL) 20 MG tablet Take 20 mg by mouth daily.    [provider]  lurasidone (LATUDA) 40 MG TABS tablet Take 40 mg by mouth every evening. After supper    [provider]  Melatonin 3 MG CAPS Take 6 mg by mouth at bedtime as needed (sleep).    [provider]  metFORMIN (GLUCOPHAGE) 850 MG tablet Take 850 mg by mouth in the morning and at bedtime.    [provider]  methotrexate (RHEUMATREX) 2.5 MG tablet Take 15 mg by mouth once a week. Caution:Chemotherapy. Protect from light. Patient not taking: Reported on 11/03/2021    [provider]  mineral oil enema Place 1 enema rectally 2 (two) times daily as needed for severe constipation.    [provider]  Mometasone Furoate 200 MCG/ACT AERO Inhale 2 puffs into the lungs 2 (two) times daily. 11/04/21   Glade Lloyd, MD  mycophenolate (CELLCEPT) 250 MG capsule Take 1,500 mg by mouth 2 (two) times daily.    [provider]  Nutritional Supplements (ENSURE ORIGINAL) LIQD Take 1 Bottle by mouth daily as needed (in between meals).    [provider]  ondansetron (ZOFRAN-ODT) 4 MG disintegrating tablet Take 1 tablet (4 mg total) by mouth every 8 (eight) hours as needed for nausea or vomiting. Patient not taking: Reported on 11/03/2021 10/04/21   Earney Navy, NP  oxcarbazepine (TRILEPTAL) 600 MG tablet Take 300 mg by mouth at bedtime.    [provider]  oxyCODONE (OXY  IR/ROXICODONE) 5 MG immediate release tablet Take 1-2 tablets (5-10 mg total) by mouth every 4 (four) hours as needed for severe pain. Patient taking differently: Take 5 mg by mouth every 4 (four) hours as needed for severe pain. 09/13/21   Dione Booze, MD  polyethylene glycol powder [facility] 17 GM/SCOOP powder Take 17 g by mouth daily.    [provider]  psyllium (METAMUCIL SMOOTH TEXTURE) 58.6 % powder Take 1 packet by mouth 3 (three) times daily. 06/29/22   Elayne Snare K, DO  senna-docusate (SENOKOT-S) 8.6-50 MG tablet Take 1 tablet by mouth daily. 06/29/22   Elayne Snare K, DO  tamsulosin (FLOMAX) 0.4 MG CAPS capsule Take 0.8 mg by mouth daily.    [provider]  Tiotropium Bromide-Olodaterol 2.5-2.5 MCG/ACT AERS Inhale 2 each into the lungs every morning. 2 puffs  [provider]  traZODone (DESYREL) 50 MG tablet Take 75 mg by mouth at bedtime.    [provider]      Allergies    Demerol [meperidine], Zocor [simvastatin], Beet [beta vulgaris], and Liver    Review of Systems   Review of Systems  All other systems reviewed and are negative.   Physical Exam Updated Vital Signs BP (!) 147/90   Pulse 94   Temp 98.3 F (36.8 C) (Oral)   Resp 12   Ht 6\' 2"  (1.88 m)   Wt 136.1 kg   SpO2 92%   BMI 38.52 kg/m  Physical Exam Vitals and nursing note reviewed.  Constitutional:      General: He is not in acute distress.    Appearance: He is well-developed. He is obese.  HENT:     Head: Normocephalic and atraumatic.  Eyes:     Conjunctiva/sclera: Conjunctivae normal.  Cardiovascular:     Rate and Rhythm: Normal rate and regular rhythm.  Pulmonary:     Effort: Pulmonary effort is normal. No respiratory distress.     Breath sounds: Normal breath sounds.     Comments: On 3L O2 via  Abdominal:     Palpations: Abdomen is soft.     Tenderness: There is abdominal tenderness.     Comments: Abdomen distended, mild right-sided  abdominal tenderness, no rebound or guarding  Musculoskeletal:        General: No swelling.     Cervical back: Neck supple.  Skin:    General: Skin is warm and dry.     Capillary Refill: Capillary refill takes less than 2 seconds.  Neurological:     Mental Status: He is alert.  Psychiatric:        Mood and Affect: Mood normal.     ED Results / Procedures / Treatments   Labs (all labs ordered are listed, but only abnormal results are displayed) Labs Reviewed  COMPREHENSIVE METABOLIC PANEL - Abnormal; Notable for the following components:      Result Value   Glucose, Bld 161 (*)    Calcium 8.4 (*)    Albumin 3.4 (*)    All other components within normal limits  CBC WITH DIFFERENTIAL/PLATELET - Abnormal; Notable for the following components:   RBC 4.18 (*)    Hemoglobin 12.3 (*)    HCT 38.0 (*)    RDW 17.5 (*)    Abs Immature Granulocytes 0.12 (*)    All other components within normal limits  RESP PANEL BY RT-PCR (RSV, FLU A&B, COVID)  RVPGX2  BRAIN NATRIURETIC PEPTIDE  LACTIC ACID, PLASMA  LACTIC ACID, PLASMA  TROPONIN I (HIGH SENSITIVITY)  TROPONIN I (HIGH SENSITIVITY)    EKG EKG Interpretation  Date/Time:  Saturday Jul 02 2022 15:46:54 EDT Ventricular Rate:  95 PR Interval:  221 QRS Duration: 128 QT Interval:  395 QTC Calculation: 497 R Axis:   -44 Text Interpretation: Sinus rhythm Prolonged PR interval Nonspecific IVCD with LAD Confirmed by Ernie Avena ([id number]) on 07/02/2022 5:23:42 PM  Radiology CT Angio Chest PE W and/or Wo Contrast  Result Date: 07/02/2022 CLINICAL DATA:  Acute abdominal pain, chest pain with inspiration, lower abdominal pain, shortness of breath EXAM: CT ANGIOGRAPHY CHEST CT ABDOMEN AND PELVIS WITH CONTRAST TECHNIQUE: Multidetector CT imaging of the chest was performed using the standard protocol during bolus administration of intravenous contrast. Multiplanar CT image reconstructions and MIPs were obtained to evaluate the vascular anatomy.  Multidetector CT imaging of the abdomen and  pelvis was performed using the standard protocol during bolus administration of intravenous contrast. RADIATION DOSE REDUCTION: This exam was performed according to the departmental dose-optimization program which includes automated exposure control, adjustment of the mA and/or kV according to patient size and/or use of iterative reconstruction technique. CONTRAST:  OMNIPAQUE IOHEXOL 350 MG/ML SOLN COMPARISON:  06/29/2022 FINDINGS: CTA CHEST FINDINGS Cardiovascular: This is a technically adequate evaluation of the pulmonary vasculature. No filling defects or pulmonary emboli. The heart is unremarkable without pericardial effusion. Diffuse atherosclerosis throughout the coronary vasculature. No evidence of thoracic aortic aneurysm or dissection. Atherosclerosis of the descending thoracic aorta. Mediastinum/Nodes: No enlarged mediastinal, hilar, or axillary lymph nodes. Thyroid gland, trachea, and esophagus demonstrate no significant findings. Lungs/Pleura: There is progressive bilateral lower lobe consolidation since the prior exam, consistent with worsening pneumonia and/or atelectasis. Small loculated left pleural effusion is unchanged. No pneumothorax. Stable upper lobe predominant emphysema. Central airways are patent. Musculoskeletal: Stable left lateral seventh and eighth rib fractures. No other acute bony abnormalities. Reconstructed images demonstrate no additional findings. Review of the MIP images confirms the above findings. CT ABDOMEN and PELVIS FINDINGS Hepatobiliary: No focal liver abnormality is seen. No gallstones, gallbladder wall thickening, or biliary dilatation. Pancreas: Unremarkable. No pancreatic ductal dilatation or surrounding inflammatory changes. Spleen: Normal in size without focal abnormality. Adrenals/Urinary Tract: Stable appearance of the bilateral kidneys. No urinary tract calculi or obstructive uropathy. The adrenals and bladder are  unremarkable. Stomach/Bowel: No bowel obstruction or ileus. Sigmoid diverticulosis is again noted. There has been near complete resolution of the wall thickening and pericolonic fat stranding within the mid sigmoid colon seen previously, consistent with improving diverticulitis. No perforation, fluid collection, or abscess. There is moderate retained stool throughout the colon consistent with constipation. Vascular/Lymphatic: Aortic atherosclerosis. No enlarged abdominal or pelvic lymph nodes. Reproductive: Stable enlargement of the prostate. Other: No free fluid or free intraperitoneal gas. Small fat containing left inguinal hernia. Musculoskeletal: No acute or destructive bony lesions. Reconstructed images demonstrate no additional findings. Review of the MIP images confirms the above findings. IMPRESSION: Chest: 1. No evidence of pulmonary embolus. 2. Progressive bilateral lower lobe consolidation, consistent with worsening pneumonia and/or atelectasis. 3. Stable small loculated left pleural effusion. 4. Aortic Atherosclerosis (ICD10-I70.0) and Emphysema (ICD10-J43.9). 5. Stable left lateral seventh and eighth rib fractures. Abdomen/pelvis: 1. Significant improvement in the acute uncomplicated sigmoid diverticulosis seen previously. Marked improvement in wall thickening and pericolonic fat stranding. 2. Marked fecal retention throughout the colon consistent with constipation. No bowel obstruction or ileus. 3. Stable enlarged prostate. 4.  Aortic Atherosclerosis (ICD10-I70.0). Electronically Signed   By: Sharlet Salina M.D.   On: 07/02/2022 17:41   CT ABDOMEN PELVIS W CONTRAST  Result Date: 07/02/2022 CLINICAL DATA:  Acute abdominal pain, chest pain with inspiration, lower abdominal pain, shortness of breath EXAM: CT ANGIOGRAPHY CHEST CT ABDOMEN AND PELVIS WITH CONTRAST TECHNIQUE: Multidetector CT imaging of the chest was performed using the standard protocol during bolus administration of intravenous contrast.  Multiplanar CT image reconstructions and MIPs were obtained to evaluate the vascular anatomy. Multidetector CT imaging of the abdomen and pelvis was performed using the standard protocol during bolus administration of intravenous contrast. RADIATION DOSE REDUCTION: This exam was performed according to the departmental dose-optimization program which includes automated exposure control, adjustment of the mA and/or kV according to patient size and/or use of iterative reconstruction technique. CONTRAST:  OMNIPAQUE IOHEXOL 350 MG/ML SOLN COMPARISON:  06/29/2022 FINDINGS: CTA CHEST FINDINGS Cardiovascular: This is a technically adequate  evaluation of the pulmonary vasculature. No filling defects or pulmonary emboli. The heart is unremarkable without pericardial effusion. Diffuse atherosclerosis throughout the coronary vasculature. No evidence of thoracic aortic aneurysm or dissection. Atherosclerosis of the descending thoracic aorta. Mediastinum/Nodes: No enlarged mediastinal, hilar, or axillary lymph nodes. Thyroid gland, trachea, and esophagus demonstrate no significant findings. Lungs/Pleura: There is progressive bilateral lower lobe consolidation since the prior exam, consistent with worsening pneumonia and/or atelectasis. Small loculated left pleural effusion is unchanged. No pneumothorax. Stable upper lobe predominant emphysema. Central airways are patent. Musculoskeletal: Stable left lateral seventh and eighth rib fractures. No other acute bony abnormalities. Reconstructed images demonstrate no additional findings. Review of the MIP images confirms the above findings. CT ABDOMEN and PELVIS FINDINGS Hepatobiliary: No focal liver abnormality is seen. No gallstones, gallbladder wall thickening, or biliary dilatation. Pancreas: Unremarkable. No pancreatic ductal dilatation or surrounding inflammatory changes. Spleen: Normal in size without focal abnormality. Adrenals/Urinary Tract: Stable appearance of the  bilateral kidneys. No urinary tract calculi or obstructive uropathy. The adrenals and bladder are unremarkable. Stomach/Bowel: No bowel obstruction or ileus. Sigmoid diverticulosis is again noted. There has been near complete resolution of the wall thickening and pericolonic fat stranding within the mid sigmoid colon seen previously, consistent with improving diverticulitis. No perforation, fluid collection, or abscess. There is moderate retained stool throughout the colon consistent with constipation. Vascular/Lymphatic: Aortic atherosclerosis. No enlarged abdominal or pelvic lymph nodes. Reproductive: Stable enlargement of the prostate. Other: No free fluid or free intraperitoneal gas. Small fat containing left inguinal hernia. Musculoskeletal: No acute or destructive bony lesions. Reconstructed images demonstrate no additional findings. Review of the MIP images confirms the above findings. IMPRESSION: Chest: 1. No evidence of pulmonary embolus. 2. Progressive bilateral lower lobe consolidation, consistent with worsening pneumonia and/or atelectasis. 3. Stable small loculated left pleural effusion. 4. Aortic Atherosclerosis (ICD10-I70.0) and Emphysema (ICD10-J43.9). 5. Stable left lateral seventh and eighth rib fractures. Abdomen/pelvis: 1. Significant improvement in the acute uncomplicated sigmoid diverticulosis seen previously. Marked improvement in wall thickening and pericolonic fat stranding. 2. Marked fecal retention throughout the colon consistent with constipation. No bowel obstruction or ileus. 3. Stable enlarged prostate. 4.  Aortic Atherosclerosis (ICD10-I70.0). Electronically Signed   By: Sharlet Salina M.D.   On: 07/02/2022 17:41    Procedures Procedures    Medications Ordered in ED Medications  cefTRIAXone (ROCEPHIN) 1 g in sodium chloride 0.9 % 100 mL IVPB (has no administration in time range)  azithromycin (ZITHROMAX) 500 mg in sodium chloride 0.9 % 250 mL IVPB (has no administration in  time range)  iohexol (OMNIPAQUE) 350 MG/ML injection 100 mL (100 mLs Intravenous Contrast Given 07/02/22 1710)    ED Course/ Medical Decision Making/ A&P                             Medical Decision Making Amount and/or Complexity of Data Reviewed Labs: ordered. Radiology: ordered.  Risk Prescription drug management. Decision regarding hospitalization.    72 year old male with medical history significant for hypertension, anxiety, depression, prior suicide attempt, COPD, chronic hypoxic respiratory failure on 3 L O2, metastatic lung cancer not currently on any treatment who presents to the emergency department with shortness of breath and hypoxia.  The patient noticed at home that his O2 sats were dropping down to 69% on ambulation over the last 2 days.  He endorses pleuritic chest discomfort.  He states that [facility] for his metastatic lung cancer.  He states that he missed an outpatient appointments due to transportation issues.  He endorses sharp substernal pleuritic discomfort.  He states that he is no longer taking his Eliquis "because I felt that I had been on it long enough."  He denies any fevers, chills, productive cough.  He was seen in the emergency department 2 days ago and diagnosed with diverticulitis however he had not started any antibiotic therapy because "my medications had not showed up in the mail."  He states that he has been inconsistently moving his bowels.  He states that he was constipated and was seen in the emergency department for this 2 days ago.  He states that he is moving gas with less frequency.  On arrival, the patient was afebrile, not tachycardic or tachypneic, BP 147/81, saturating 91% on home 3 L O2.  The patient is presenting with worsening acute on chronic hypoxic respiratory failure with desaturations on ambulation down to 70% at his baseline O2.  Endorses sharp pleuritic discomfort and has  stopped taking his Eliquis.  In the setting of known metastatic cancer, differential diagnosis includes PE, pneumothorax, pneumonia, viral infection, heart failure, additionally regarding the patient's abdominal discomfort, considered worsening diverticulitis, bowel obstruction, perforated viscus.   IV access obtained and laboratory workup was initiated.  Will obtain CT of the abdomen pelvis and CTA PE study to further evaluate.  EKG: Sinus rhythm, ventricular rate 95, prolonged PR interval at 221, no ST segment elevations or significant T wave inversions to suggest ischemia.  Labs: Lactic acid 1.8, BMP normal, CMP with hyperglycemia 161, otherwise unremarkable, CBC without a leukocytosis, mild anemia to 12.3, troponin initially 5, COVID-19 and influenza PCR testing pending.  Repeat troponin pending however pt without chest pain.  CTA PE and CT Abdomen Pelvis IMPRESSION:  Chest:    1. No evidence of pulmonary embolus.  2. Progressive bilateral lower lobe consolidation, consistent with  worsening pneumonia and/or atelectasis.  3. Stable small loculated left pleural effusion.  4. Aortic Atherosclerosis (ICD10-I70.0) and Emphysema (ICD10-J43.9).  5. Stable left lateral seventh and eighth rib fractures.    Abdomen/pelvis:    1. Significant improvement in the acute uncomplicated sigmoid  diverticulosis seen previously. Marked improvement in wall  thickening and pericolonic fat stranding.  2. Marked fecal retention throughout the colon consistent with  constipation. No bowel obstruction or ileus.  3. Stable enlarged prostate.  4.  Aortic Atherosclerosis (ICD10-I70.0).    Due to concern for acute on chronic hypoxic respiratory failure in the setting of likely commune acquired pneumonia, hospitalist medicine was consulted for admission.  The patient was covered with antibiotics for community acquired pneumonia.  Dr. Sheppard Penton of hospitalist medicine subsequently accepted the patient in admission.   On repeat assessment prior to admission, the patient's lungs were clear to auscultation bilaterally and he was hemodynamically stable.   Final Clinical Impression(s) / ED Diagnoses Final diagnoses:  Community acquired pneumonia, unspecified laterality  Acute on chronic respiratory failure with hypoxia [facility])    Rx / DC Orders ED Discharge Orders     None         Ernie Avena, MD 07/02/22 1851

## 2022-07-02 NOTE — Assessment & Plan Note (Signed)
72 year old presenting to ED with complaints of hypoxia on home 3L and dypsnea on exertion found to have progressive bilateral lower lobe consolidation possible worsening pneumonia and/or atelectasis  -obs to tele -continue rocephin and azithromycin -he has had no fever/cough/leukocytosis, no recorded hypoxic events here but reported as low as 69% at home  -check urinary antigens -lactic acid wnl  -trend PCT, check RVP -BNP wnl and troponin wnl -covid/flu/rsv negative  -scheduled duonebs, prn SABA

## 2022-07-02 NOTE — H&P (Signed)
History and Physical    Patient: Mario [name]:[id number] DOB: 02-04-1951 DOA: 07/02/2022 DOS: the patient was seen and examined on 07/02/2022 PCP: [name]  Patient coming from: Home - lives alone, rents a room in a house. Ambulates independently    Chief Complaint:  hypoxia   HPI: Mario [name] is a 72 y.o. male with medical history significant of NSCL cancer s/p SBRT, malignant pleural effusion s/p left pleurex catheter removed on 09/01/2021, vasculitis and AS, hx of PE on eliquis, COPD with chronic respiratory failure on 3L oxygen,HTN, OSA, CAD, T2DM, CKD stage 3a who presented to ED with shortness of breath and acute on chronic hypoxia with oxygen down to 69% at home. He states a couple of days ago he woke up and got in his chair and checked his oxygen and he was at 69%. I asked if he had his oxygen on and he said, "I think so."  He states it would drop to the 70s walking to the bathroom. He states he would get more winded and had to sit in chair for awhile.  He didn't come in right away because he thought it was fluke. He called the [place] nurse today to discuss and they told him to go to ED. No fever/chills, does state his roommate his sick (he lives across the hall) thinks he has allergies or something. He states he has a cough at night, but nothing out of the ordinary. No increased sputum or viscosity.   Denies any fever/chills, vision changes/headaches, chest pain or palpitations, cough, abdominal pain, N/V/D, dysuria or leg swelling.    He does not smoke or drink alcohol.   ER Course:  vitals: afebrile, bp: 147/90, HR: 94, RR: 12, oxygen: 92% on 3L  Pertinent labs: none CTA chest: No evidence of pulmonary embolus. 2. Progressive bilateral lower lobe consolidation, consistent with worsening pneumonia and/or atelectasis. 3. Stable small loculated left pleural effusion. 4. Aortic Atherosclerosis (ICD10-I70.0) and Emphysema (ICD10-J43.9). 5. Stable left lateral seventh  and eighth rib fractures. CT abdomen/pelvis: 1. Significant improvement in the acute uncomplicated sigmoid diverticulosis seen previously. Marked improvement in wall thickening and pericolonic fat stranding. 2. Marked fecal retention throughout the colon consistent with constipation. No bowel obstruction or ileus. 3. Stable enlarged prostate. 4.  Aortic Atherosclerosis In ED: started on azithromycin and rocephin. [facility] asked to admit.    Review of Systems: As mentioned in the history of present illness. All other systems reviewed and are negative. Past Medical History:  Diagnosis Date   Anxiety    Bronchitis    COPD (chronic obstructive pulmonary disease) (HCC)    Depression    History of radiation therapy    Left lung- 10/05/20-10/15/20- Dr. Antony Blackbird   Hypertension    lung ca 09/2020   MI (myocardial infarction) [facility])    ????   OSA (obstructive sleep apnea)    Suicide attempt [facility])    Tension pneumothorax 06/27/2016   Uveitis    Past Surgical History:  Procedure Laterality Date   BIOPSY  07/03/2021   Procedure: BIOPSY;  Surgeon: Kathi Der, MD;  Location: [facility] ENDOSCOPY;  Service: Gastroenterology;;   BRONCHIAL BIOPSY  07/30/2020   Procedure: BRONCHIAL BIOPSIES;  Surgeon: Josephine Igo, DO;  Location: [facility];  Service: Pulmonary;;   BRONCHIAL BRUSHINGS  07/30/2020   Procedure: BRONCHIAL BRUSHINGS;  Surgeon: Josephine Igo, DO;  Location: [facility];  Service: Pulmonary;;   BRONCHIAL NEEDLE ASPIRATION BIOPSY  07/30/2020   Procedure: BRONCHIAL NEEDLE  ASPIRATION BIOPSIES;  Surgeon: Josephine Igo, DO;  Location: [facility];  Service: Pulmonary;;   BRONCHIAL WASHINGS  07/30/2020   Procedure: BRONCHIAL WASHINGS;  Surgeon: Josephine Igo, DO;  Location: [facility];  Service: Pulmonary;;   CHEST TUBE INSERTION Left 06/27/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     ESOPHAGOGASTRODUODENOSCOPY N/A 07/03/2021   Procedure: ESOPHAGOGASTRODUODENOSCOPY (EGD);  Surgeon: Kathi Der,  MD;  Location: [facility];  Service: Gastroenterology;  Laterality: N/A;   IR PERC PLEURAL DRAIN W/INDWELL CATH W/IMG GUIDE  08/04/2021   IR REMOVAL OF PLURAL CATH W/CUFF  09/01/2021   SKIN CANCER EXCISION     VIDEO BRONCHOSCOPY WITH ENDOBRONCHIAL NAVIGATION Left 07/30/2020   Procedure: VIDEO BRONCHOSCOPY WITH ENDOBRONCHIAL NAVIGATION;  Surgeon: Josephine Igo, DO;  Location: [facility];  Service: Pulmonary;  Laterality: Left;   Social History:  reports that he quit smoking about 6 years ago. His smoking use included cigarettes. He has a 35.00 pack-year smoking history. He has never used smokeless tobacco. He reports that he does not drink alcohol and does not use drugs.  Allergies  Allergen Reactions   Demerol [Meperidine] Nausea And Vomiting and Other (See Comments)    Made the patient "violently sick"   Zocor [Simvastatin] Nausea And Vomiting and Other (See Comments)    Made him very jittery, also   Beet [Beta Vulgaris] Nausea And Vomiting   Collard Greens [Wild Lettuce Extract (Lactuca Virosa)] Other (See Comments)    Throw up   Liver Nausea And Vomiting    Family History  Problem Relation Age of Onset   Dementia Father     Prior to Admission medications   Medication Sig Start Date End Date Taking? Authorizing Provider  albuterol (PROVENTIL) (2.5 MG/3ML) 0.083% nebulizer solution Inhale 3 mLs (2.5 mg total) by nebulization every 4 (four) hours as needed for wheezing or shortness of breath. Patient taking differently: Take 2.5 mg by nebulization every 6 (six) hours as needed for wheezing or shortness of breath. 08/04/20 10/02/22  Rolly Salter, MD  albuterol (VENTOLIN HFA) 108 (90 Base) MCG/ACT inhaler Inhale 2 puffs into the lungs every 4 (four) hours as needed for wheezing or shortness of breath. 11/04/21   Glade Lloyd, MD  amoxicillin-clavulanate (AUGMENTIN) 875-125 MG tablet Take 1 tablet by mouth every 12 (twelve) hours. 06/29/22   Rexford Maus, DO  apixaban (ELIQUIS) 5  MG TABS tablet Take 5 mg by mouth 2 (two) times daily.    [provider]  azithromycin (ZITHROMAX) 250 MG tablet Take 1 tablet (250 mg total) by mouth daily. Take first 2 tablets together, then 1 every day until finished. 06/30/22   Sloan Leiter, DO  buPROPion (WELLBUTRIN XL) 300 MG 24 hr tablet Take 300 mg by mouth every morning. 02/05/20   [provider]  busPIRone (BUSPAR) 15 MG tablet Take 15 mg by mouth daily.    [provider]  Carboxymethylcellulose Sod PF 0.5 % SOLN Place 1 drop into both eyes 4 (four) times daily as needed (dry eyes).    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    [provider]  cyclobenzaprine (FLEXERIL) 10 MG tablet Take 30 mg by mouth See admin instructions. 10 mg once daily for neck, headache, and muscular lower back pain + 30 mg at bedtime    [provider]  cycloSPORINE (RESTASIS) 0.05 % ophthalmic emulsion Place 1 drop into  both eyes every 12 (twelve) hours.    [provider]  diltiazem (CARDIZEM CD) 180 MG 24 hr capsule Take 180 mg by mouth daily.    [provider]  docusate sodium (COLACE) 100 MG capsule Take 100 mg by mouth 2 (two) times daily.    [provider]  DULoxetine (CYMBALTA) 60 MG capsule Take 1 capsule (60 mg total) by mouth 2 (two) times daily. 10/04/21 11/03/21  Earney Navy, NP  fluticasone (FLONASE) 50 MCG/ACT nasal spray Place 2 sprays into both nostrils daily.     [provider]  folic acid (FOLVITE) 1 MG tablet Take 1 mg by mouth daily.    [provider]  gabapentin (NEURONTIN) 300 MG capsule Take 3 capsules (900 mg total) by mouth at bedtime. Patient taking differently: Take 300-900 mg by mouth See admin instructions. Take 1 capsule by mouth in the morning and 3 capsules at bedtime 10/04/21 11/03/21  Earney Navy, NP  guaiFENesin  (MUCINEX) 600 MG 12 hr tablet Take 600 mg by mouth 2 (two) times daily as needed for cough.    [provider]  lisinopril (ZESTRIL) 20 MG tablet Take 20 mg by mouth daily.    [provider]  lurasidone (LATUDA) 40 MG TABS tablet Take 40 mg by mouth every evening. After supper    [provider]  Melatonin 3 MG CAPS Take 6 mg by mouth at bedtime as needed (sleep).    [provider]  metFORMIN (GLUCOPHAGE) 850 MG tablet Take 850 mg by mouth in the morning and at bedtime.    [provider]  methotrexate (RHEUMATREX) 2.5 MG tablet Take 15 mg by mouth once a week. Caution:Chemotherapy. Protect from light. Patient not taking: Reported on 11/03/2021    [provider]  mineral oil enema Place 1 enema rectally 2 (two) times daily as needed for severe constipation.    [provider]  Mometasone Furoate 200 MCG/ACT AERO Inhale 2 puffs into the lungs 2 (two) times daily. 11/04/21   Glade Lloyd, MD  mycophenolate (CELLCEPT) 250 MG capsule Take 1,500 mg by mouth 2 (two) times daily.    [provider]  Nutritional Supplements (ENSURE ORIGINAL) LIQD Take 1 Bottle by mouth daily as needed (in between meals).    [provider]  ondansetron (ZOFRAN-ODT) 4 MG disintegrating tablet Take 1 tablet (4 mg total) by mouth every 8 (eight) hours as needed for nausea or vomiting. Patient not taking: Reported on 11/03/2021 10/04/21   Earney Navy, NP  oxcarbazepine (TRILEPTAL) 600 MG tablet Take 300 mg by mouth at bedtime.    [provider]  oxyCODONE (OXY IR/ROXICODONE) 5 MG immediate release tablet Take 1-2 tablets (5-10 mg total) by mouth every 4 (four) hours as needed for severe pain. Patient taking differently: Take 5 mg by mouth every 4 (four) hours as needed for severe pain. 09/13/21   Dione Booze, MD  polyethylene glycol powder [facility]) 17 GM/SCOOP powder Take 17 g by mouth daily.    [provider]   psyllium (METAMUCIL SMOOTH TEXTURE) 58.6 % powder Take 1 packet by mouth 3 (three) times daily. 06/29/22   Elayne Snare K, DO  senna-docusate (SENOKOT-S) 8.6-50 MG tablet Take 1 tablet by mouth daily. 06/29/22   Elayne Snare K, DO  tamsulosin (FLOMAX) 0.4 MG CAPS capsule Take 0.8 mg by mouth daily.    [provider]  Tiotropium Bromide-Olodaterol 2.5-2.5 MCG/ACT AERS Inhale 2 each into the lungs every  morning. 2 puffs    [provider]  traZODone (DESYREL) 50 MG tablet Take 75 mg by mouth at bedtime.    [provider]    Physical Exam: Vitals:   07/02/22 1613 07/02/22 1925 07/02/22 2000 07/02/22 2024  BP:  (!) 147/84 139/73 133/73  Pulse:  82 81 77  Resp:  19 (!) 21 20  Temp:  97.9 F (36.6 C)  (!) 97.5 F (36.4 C)  TempSrc:  Oral  Oral  SpO2:  97% 98% 96%  Weight: 136.1 kg     Height: 6\' 2"  (1.88 m)      General:  Appears calm and comfortable and is in NAD Eyes:  PERRL, EOMI, normal lids, iris ENT:  grossly normal hearing, lips & tongue, mmm; appropriate dentition Neck:  no LAD, masses or thyromegaly; no carotid bruits Cardiovascular:  RRR, no m/r/g. 1+ bilateral LE edema.  Respiratory:   crackles in RLL, otherwise normal exam.   Normal respiratory effort. Abdomen:  soft, NT, ND, NABS Back:   normal alignment, no CVAT Skin:  no rash or induration seen on limited exam Musculoskeletal:  grossly normal tone BUE/BLE, good ROM, no bony abnormality Lower extremity:  No LE edema.  Limited foot exam with no ulcerations.  2+ distal pulses. Psychiatric:  grossly normal mood and affect, speech fluent and appropriate, AOx3 Neurologic:  CN 2-12 grossly intact, moves all extremities in coordinated fashion, sensation intact   Radiological Exams on Admission: Independently reviewed - see discussion in A/P where applicable  CT Angio Chest PE W and/or Wo Contrast  Result Date: 07/02/2022 CLINICAL DATA:  Acute abdominal pain, chest pain with inspiration,  lower abdominal pain, shortness of breath EXAM: CT ANGIOGRAPHY CHEST CT ABDOMEN AND PELVIS WITH CONTRAST TECHNIQUE: Multidetector CT imaging of the chest was performed using the standard protocol during bolus administration of intravenous contrast. Multiplanar CT image reconstructions and MIPs were obtained to evaluate the vascular anatomy. Multidetector CT imaging of the abdomen and pelvis was performed using the standard protocol during bolus administration of intravenous contrast. RADIATION DOSE REDUCTION: This exam was performed according to the departmental dose-optimization program which includes automated exposure control, adjustment of the mA and/or kV according to patient size and/or use of iterative reconstruction technique. CONTRAST:  OMNIPAQUE IOHEXOL 350 MG/ML SOLN COMPARISON:  06/29/2022 FINDINGS: CTA CHEST FINDINGS Cardiovascular: This is a technically adequate evaluation of the pulmonary vasculature. No filling defects or pulmonary emboli. The heart is unremarkable without pericardial effusion. Diffuse atherosclerosis throughout the coronary vasculature. No evidence of thoracic aortic aneurysm or dissection. Atherosclerosis of the descending thoracic aorta. Mediastinum/Nodes: No enlarged mediastinal, hilar, or axillary lymph nodes. Thyroid gland, trachea, and esophagus demonstrate no significant findings. Lungs/Pleura: There is progressive bilateral lower lobe consolidation since the prior exam, consistent with worsening pneumonia and/or atelectasis. Small loculated left pleural effusion is unchanged. No pneumothorax. Stable upper lobe predominant emphysema. Central airways are patent. Musculoskeletal: Stable left lateral seventh and eighth rib fractures. No other acute bony abnormalities. Reconstructed images demonstrate no additional findings. Review of the MIP images confirms the above findings. CT ABDOMEN and PELVIS FINDINGS Hepatobiliary: No focal liver abnormality is seen. No gallstones,  gallbladder wall thickening, or biliary dilatation. Pancreas: Unremarkable. No pancreatic ductal dilatation or surrounding inflammatory changes. Spleen: Normal in size without focal abnormality. Adrenals/Urinary Tract: Stable appearance of the bilateral kidneys. No urinary tract calculi or obstructive uropathy. The adrenals and bladder are unremarkable. Stomach/Bowel: No bowel obstruction or ileus. Sigmoid diverticulosis is again noted. There  has been near complete resolution of the wall thickening and pericolonic fat stranding within the mid sigmoid colon seen previously, consistent with improving diverticulitis. No perforation, fluid collection, or abscess. There is moderate retained stool throughout the colon consistent with constipation. Vascular/Lymphatic: Aortic atherosclerosis. No enlarged abdominal or pelvic lymph nodes. Reproductive: Stable enlargement of the prostate. Other: No free fluid or free intraperitoneal gas. Small fat containing left inguinal hernia. Musculoskeletal: No acute or destructive bony lesions. Reconstructed images demonstrate no additional findings. Review of the MIP images confirms the above findings. IMPRESSION: Chest: 1. No evidence of pulmonary embolus. 2. Progressive bilateral lower lobe consolidation, consistent with worsening pneumonia and/or atelectasis. 3. Stable small loculated left pleural effusion. 4. Aortic Atherosclerosis (ICD10-I70.0) and Emphysema (ICD10-J43.9). 5. Stable left lateral seventh and eighth rib fractures. Abdomen/pelvis: 1. Significant improvement in the acute uncomplicated sigmoid diverticulosis seen previously. Marked improvement in wall thickening and pericolonic fat stranding. 2. Marked fecal retention throughout the colon consistent with constipation. No bowel obstruction or ileus. 3. Stable enlarged prostate. 4.  Aortic Atherosclerosis (ICD10-I70.0). Electronically Signed   By: Sharlet Salina M.D.   On: 07/02/2022 17:41   CT ABDOMEN PELVIS W  CONTRAST  Result Date: 07/02/2022 CLINICAL DATA:  Acute abdominal pain, chest pain with inspiration, lower abdominal pain, shortness of breath EXAM: CT ANGIOGRAPHY CHEST CT ABDOMEN AND PELVIS WITH CONTRAST TECHNIQUE: Multidetector CT imaging of the chest was performed using the standard protocol during bolus administration of intravenous contrast. Multiplanar CT image reconstructions and MIPs were obtained to evaluate the vascular anatomy. Multidetector CT imaging of the abdomen and pelvis was performed using the standard protocol during bolus administration of intravenous contrast. RADIATION DOSE REDUCTION: This exam was performed according to the departmental dose-optimization program which includes automated exposure control, adjustment of the mA and/or kV according to patient size and/or use of iterative reconstruction technique. CONTRAST:  OMNIPAQUE IOHEXOL 350 MG/ML SOLN COMPARISON:  06/29/2022 FINDINGS: CTA CHEST FINDINGS Cardiovascular: This is a technically adequate evaluation of the pulmonary vasculature. No filling defects or pulmonary emboli. The heart is unremarkable without pericardial effusion. Diffuse atherosclerosis throughout the coronary vasculature. No evidence of thoracic aortic aneurysm or dissection. Atherosclerosis of the descending thoracic aorta. Mediastinum/Nodes: No enlarged mediastinal, hilar, or axillary lymph nodes. Thyroid gland, trachea, and esophagus demonstrate no significant findings. Lungs/Pleura: There is progressive bilateral lower lobe consolidation since the prior exam, consistent with worsening pneumonia and/or atelectasis. Small loculated left pleural effusion is unchanged. No pneumothorax. Stable upper lobe predominant emphysema. Central airways are patent. Musculoskeletal: Stable left lateral seventh and eighth rib fractures. No other acute bony abnormalities. Reconstructed images demonstrate no additional findings. Review of the MIP images confirms the above  findings. CT ABDOMEN and PELVIS FINDINGS Hepatobiliary: No focal liver abnormality is seen. No gallstones, gallbladder wall thickening, or biliary dilatation. Pancreas: Unremarkable. No pancreatic ductal dilatation or surrounding inflammatory changes. Spleen: Normal in size without focal abnormality. Adrenals/Urinary Tract: Stable appearance of the bilateral kidneys. No urinary tract calculi or obstructive uropathy. The adrenals and bladder are unremarkable. Stomach/Bowel: No bowel obstruction or ileus. Sigmoid diverticulosis is again noted. There has been near complete resolution of the wall thickening and pericolonic fat stranding within the mid sigmoid colon seen previously, consistent with improving diverticulitis. No perforation, fluid collection, or abscess. There is moderate retained stool throughout the colon consistent with constipation. Vascular/Lymphatic: Aortic atherosclerosis. No enlarged abdominal or pelvic lymph nodes. Reproductive: Stable enlargement of the prostate. Other: No free fluid or free intraperitoneal gas. Small fat containing  left inguinal hernia. Musculoskeletal: No acute or destructive bony lesions. Reconstructed images demonstrate no additional findings. Review of the MIP images confirms the above findings. IMPRESSION: Chest: 1. No evidence of pulmonary embolus. 2. Progressive bilateral lower lobe consolidation, consistent with worsening pneumonia and/or atelectasis. 3. Stable small loculated left pleural effusion. 4. Aortic Atherosclerosis (ICD10-I70.0) and Emphysema (ICD10-J43.9). 5. Stable left lateral seventh and eighth rib fractures. Abdomen/pelvis: 1. Significant improvement in the acute uncomplicated sigmoid diverticulosis seen previously. Marked improvement in wall thickening and pericolonic fat stranding. 2. Marked fecal retention throughout the colon consistent with constipation. No bowel obstruction or ileus. 3. Stable enlarged prostate. 4.  Aortic Atherosclerosis  (ICD10-I70.0). Electronically Signed   By: Sharlet Salina M.D.   On: 07/02/2022 17:41    EKG: Independently reviewed.  NSR with rate 95; nonspecific ST changes with no evidence of acute ischemia   Labs on Admission: I have personally reviewed the available labs and imaging studies at the time of the admission.  Pertinent labs:   None   Assessment and Plan: Principal Problem:   Community acquired pneumonia Active Problems:   Acute on chronic respiratory failure with hypoxia (HCC)   DM2 (diabetes mellitus, type 2) (HCC)   COPD (chronic obstructive pulmonary disease) (HCC)   Non-small cell cancer of left lung (HCC)   Hypertension   history of Pulmonary embolism (HCC)   Chronic pain   Vasculitis (HCC)   BPH (benign prostatic hyperplasia)   Stage 3a chronic kidney disease (HCC)   Major depressive disorder, recurrent episode (HCC)   OSA (obstructive sleep apnea)    Assessment and Plan: * Community acquired pneumonia 72 year old presenting to ED with complaints of hypoxia on home 3L and dypsnea on exertion found to have progressive bilateral lower lobe consolidation possible worsening pneumonia and/or atelectasis  -obs to tele -continue rocephin and azithromycin -he has had no fever/cough/leukocytosis, no recorded hypoxic events here but reported as low as 69% at home  -check urinary antigens -lactic acid wnl  -trend PCT, check RVP -BNP wnl and troponin wnl -covid/flu/rsv negative  -scheduled duonebs, prn SABA  Acute on chronic respiratory failure with hypoxia (HCC) Per patient as low as 69% on his home 3L oxygen, but he has been in the 90s even with movement and standing here in ED while on 3L oxygen.  No recorded events of hypoxia while in ED Covid/flu/rsv negative. Troponin wnl and bnp wnl  Possible bilateral lower lobe pneumonia vs. Atelectasis  Continue to monitor, but stable on his home 3L oxygen   DM2 (diabetes mellitus, type 2) (HCC) Last A1C 6.3 in 05/2022 SSI and  accuchecks qac/hs Hold metformin inpatient   COPD (chronic obstructive pulmonary disease) (HCC) No signs of exacerbation. Clear on exam  Continue maintenance inhaler SABA prn   Non-small cell cancer of left lung (HCC) Followed by Dr. [name], but he went to [facility] for second opinion.  [facility] did not do chemo, but did think that KRAS G12C therapy should be offered since he could not have first line therapy or immune therapy. He was not able to get this. Hoping he can get through the [place]  Has appointment with oncology at [facility] coming up to discuss this   Hypertension Continue cardizem and lisinopril   history of Pulmonary embolism (HCC) States he stopped his eliquis on his own. Had been on it longer than 6 months after PE diagnosis.  Nothing mentioned in his OV at [place] recently  CTA with no PE here today  VTE ppx   Chronic pain OxyContin filled 05/2022 for #60 pills Was on tramadol for most of fall 2023  Recently seen at pain clinic for his oxy, but tells me he doesn't take it often Will continue PRN here  Unclear if still on gabapentin, appears discontinued in [place] note, but waiting on fax   Vasculitis (HCC) Appears to no longer be on cellcept F/u with med rec   BPH (benign prostatic hyperplasia) Continue flomax daily   Stage 3a chronic kidney disease (HCC) Stable, continue to monitor   Major depressive disorder, recurrent episode (HCC) Looking at [place] looks like he may no longer be on wellbutrin, latuda or buspar Continue cymbalta. Waiting fax from [place] to verify medication. He is unsure.   OSA (obstructive sleep apnea) Continue cpap at night     Advance Care Planning:   Code Status: Full Code   Consults: none   DVT Prophylaxis: lovenox   Family Communication: none   Severity of Illness: The appropriate patient status for this patient is OBSERVATION. Observation status is judged to be reasonable and necessary in order to provide the required intensity of service to ensure the  patient's safety. The patient's presenting symptoms, physical exam findings, and initial radiographic and laboratory data in the context of their medical condition is felt to place them at decreased risk for further clinical deterioration. Furthermore, it is anticipated that the patient will be medically stable for discharge from the hospital within 2 midnights of admission.   Author: Orland Mustard, MD 07/02/2022 9:50 PM  For on call review [email or web address].

## 2022-07-02 NOTE — ED Triage Notes (Signed)
Pt arrived via EMS, from home. C/o SOB, hx of COPD and lung CA, on 3L CN baseline. Stated that when he was walking up the stairs he felt more SOB and spo2 was in the 70s. Spo2 90s on EMS arrival.

## 2022-07-02 NOTE — Assessment & Plan Note (Signed)
Continue cpap at night  

## 2022-07-02 NOTE — ED Notes (Signed)
ED TO INPATIENT HANDOFF REPORT  ED Nurse Name and Phone #: Vonna Kotyk, RN  S Name/Age/Gender Mario [name] 72 y.o. male Room/Bed: WA21/WA21  Code Status   Code Status: Prior  Home/SNF/Other Home Patient oriented to: self, place, time, and situation Is this baseline? Yes   Triage Complete: Triage complete  Chief Complaint Community acquired pneumonia [J18.9]  Triage Note Pt arrived via EMS, from home. C/o SOB, hx of COPD and lung CA, on 3L CN baseline. Stated that when he was walking up the stairs he felt more SOB and spo2 was in the 70s. Spo2 90s on EMS arrival.     Allergies Allergies  Allergen Reactions   Demerol [Meperidine] Nausea And Vomiting and Other (See Comments)    Made the patient "violently sick"   Zocor [Simvastatin] Nausea And Vomiting and Other (See Comments)    Made him very jittery, also   Beet [Beta Vulgaris] Nausea And Vomiting   Liver Nausea And Vomiting    Level of Care/Admitting Diagnosis ED Disposition     ED Disposition  Admit   Condition  --   Comment  Hospital Area: [facility] [[id number]]  Level of Care: Telemetry [5]  Admit to tele based on following criteria: Monitor for Ischemic changes  May place patient in observation at [facility] or [facility] if equivalent level of care is available:: Yes  Covid Evaluation: Symptomatic Person Under Investigation (PUI) or recent exposure (last 10 days) *Testing Required*  Diagnosis: Community acquired pneumonia [161096]  Admitting Physician: Orland Mustard [[id number]]  Attending Physician: Orland Mustard [[id number]]          B Medical/Surgery History Past Medical History:  Diagnosis Date   Anxiety    Bronchitis    COPD (chronic obstructive pulmonary disease) (HCC)    Depression    History of radiation therapy    Left lung- 10/05/20-10/15/20- Dr. Antony Blackbird   Hypertension    lung ca 09/2020   MI (myocardial infarction) [facility])    ????   OSA (obstructive sleep  apnea)    Suicide attempt (HCC)    Tension pneumothorax 06/27/2016   Uveitis    Past Surgical History:  Procedure Laterality Date   BIOPSY  07/03/2021   Procedure: BIOPSY;  Surgeon: Kathi Der, MD;  Location: [facility];  Service: Gastroenterology;;   BRONCHIAL BIOPSY  07/30/2020   Procedure: BRONCHIAL BIOPSIES;  Surgeon: Josephine Igo, DO;  Location: [facility];  Service: Pulmonary;;   BRONCHIAL BRUSHINGS  07/30/2020   Procedure: BRONCHIAL BRUSHINGS;  Surgeon: Josephine Igo, DO;  Location: [facility];  Service: Pulmonary;;   BRONCHIAL NEEDLE ASPIRATION BIOPSY  07/30/2020   Procedure: BRONCHIAL NEEDLE ASPIRATION BIOPSIES;  Surgeon: Josephine Igo, DO;  Location: [facility];  Service: Pulmonary;;   BRONCHIAL WASHINGS  07/30/2020   Procedure: BRONCHIAL WASHINGS;  Surgeon: Josephine Igo, DO;  Location: [facility];  Service: Pulmonary;;   CHEST TUBE INSERTION Left 06/27/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     ESOPHAGOGASTRODUODENOSCOPY N/A 07/03/2021   Procedure: ESOPHAGOGASTRODUODENOSCOPY (EGD);  Surgeon: Kathi Der, MD;  Location: [facility];  Service: Gastroenterology;  Laterality: N/A;   IR PERC PLEURAL DRAIN W/INDWELL CATH W/IMG GUIDE  08/04/2021   IR REMOVAL OF PLURAL CATH W/CUFF  09/01/2021   SKIN CANCER EXCISION     VIDEO BRONCHOSCOPY WITH ENDOBRONCHIAL NAVIGATION Left 07/30/2020   Procedure: VIDEO BRONCHOSCOPY WITH ENDOBRONCHIAL NAVIGATION;  Surgeon: Josephine Igo, DO;  Location: [facility];  Service: Pulmonary;  Laterality: Left;  A IV Location/Drains/Wounds Patient Lines/Drains/Airways Status     Active Line/Drains/Airways     Name Placement date Placement time Site Days   Peripheral IV 06/29/22 20 G Posterior;Right Forearm 06/29/22  2046  Forearm  3   Peripheral IV 07/02/22 20 G Right;Posterior Forearm 07/02/22  1637  Forearm  less than 1            Intake/Output Last 24 hours No intake or output data in the 24 hours ending 07/02/22  1907  Labs/Imaging Results for orders placed or performed during the hospital encounter of 07/02/22 (from the past 48 hour(s))  Comprehensive metabolic panel     Status: Abnormal   Collection Time: 07/02/22  4:35 PM  Result Value Ref Range   Sodium 135 135 - 145 mmol/L   Potassium 4.0 3.5 - 5.1 mmol/L   Chloride 101 98 - 111 mmol/L   CO2 23 22 - 32 mmol/L   Glucose, Bld 161 (H) 70 - 99 mg/dL    Comment: Glucose reference range applies only to samples taken after fasting for at least 8 hours.   BUN 14 8 - 23 mg/dL   Creatinine, Ser 7.82 0.61 - 1.24 mg/dL   Calcium 8.4 (L) 8.9 - 10.3 mg/dL   Total Protein 6.9 6.5 - 8.1 g/dL   Albumin 3.4 (L) 3.5 - 5.0 g/dL   AST 20 15 - 41 U/L   ALT 17 0 - 44 U/L   Alkaline Phosphatase 91 38 - 126 U/L   Total Bilirubin 0.5 0.3 - 1.2 mg/dL   GFR, Estimated >95 >62 mL/min    Comment: (NOTE) Calculated using the CKD-EPI Creatinine Equation (2021)    Anion gap 11 5 - 15    Comment: Performed at [facility], [address] [place], [place] [address]  Brain natriuretic peptide     Status: None   Collection Time: 07/02/22  4:35 PM  Result Value Ref Range   B Natriuretic Peptide 20.1 0.0 - 100.0 pg/mL    Comment: Performed at [facility], [address] [place], [place] [address]  CBC with Differential/Platelet     Status: Abnormal   Collection Time: 07/02/22  4:35 PM  Result Value Ref Range   WBC 8.2 4.0 - 10.5 K/uL   RBC 4.18 (L) 4.22 - 5.81 MIL/uL   Hemoglobin 12.3 (L) 13.0 - 17.0 g/dL   HCT 96.2 (L) 95.2 - 84.1 %   MCV 90.9 80.0 - 100.0 fL   MCH 29.4 26.0 - 34.0 pg   MCHC 32.4 30.0 - 36.0 g/dL   RDW 32.4 (H) 40.1 - 02.7 %   Platelets 331 150 - 400 K/uL   nRBC 0.0 0.0 - 0.2 %   Neutrophils Relative % 67 %   Neutro Abs 5.6 1.7 - 7.7 K/uL   Lymphocytes Relative 18 %   Lymphs Abs 1.5 0.7 - 4.0 K/uL   Monocytes Relative 9 %   Monocytes Absolute 0.7 0.1 - 1.0 K/uL   Eosinophils Relative 3 %    Eosinophils Absolute 0.2 0.0 - 0.5 K/uL   Basophils Relative 1 %   Basophils Absolute 0.1 0.0 - 0.1 K/uL   Immature Granulocytes 2 %   Abs Immature Granulocytes 0.12 (H) 0.00 - 0.07 K/uL    Comment: Performed at [facility], [address] [place], [place] [address]  Troponin I (High Sensitivity)     Status: None   Collection Time: 07/02/22  4:35 PM  Result  Value Ref Range   Troponin I (High Sensitivity) 5 <18 ng/L    Comment: (NOTE) Elevated high sensitivity troponin I (hsTnI) values and significant  changes across serial measurements may suggest ACS but many other  chronic and acute conditions are known to elevate hsTnI results.  Refer to the "Links" section for chest pain algorithms and additional  guidance. Performed at [facility], [address] [place] [address], [place], [place] [address]   Lactic acid, plasma     Status: None   Collection Time: 07/02/22  4:35 PM  Result Value Ref Range   Lactic Acid, Venous 1.8 0.5 - 1.9 mmol/L    Comment: Performed at [facility], [address] [place], [place] [address]  Resp panel by RT-PCR (RSV, Flu A&B, Covid) Anterior Nasal Swab     Status: None   Collection Time: 07/02/22  6:04 PM   Specimen: Anterior Nasal Swab  Result Value Ref Range   SARS Coronavirus 2 by RT PCR NEGATIVE NEGATIVE    Comment: (NOTE) SARS-CoV-2 target nucleic acids are NOT DETECTED.  The SARS-CoV-2 RNA is generally detectable in upper respiratory specimens during the acute phase of infection. The lowest concentration of SARS-CoV-2 viral copies this assay can detect is 138 copies/mL. A negative result does not preclude SARS-Cov-2 infection and should not be used as the sole basis for treatment or other patient management decisions. A negative result may occur with  improper specimen collection/handling, submission of specimen other than nasopharyngeal swab, presence of viral mutation(s) within the areas targeted by  this assay, and inadequate number of viral copies(<138 copies/mL). A negative result must be combined with clinical observations, patient history, and epidemiological information. The expected result is Negative.  Fact Sheet for Patients:  BloggerCourse.com  Fact Sheet for Healthcare Providers:  SeriousBroker.it  This test is no t yet approved or cleared by the [place] FDA and  has been authorized for detection and/or diagnosis of SARS-CoV-2 by FDA under an Emergency Use Authorization (EUA). This EUA will remain  in effect (meaning this test can be used) for the duration of the COVID-19 declaration under Section 564(b)(1) of the Act, 21 U.S.C.section 360bbb-3(b)(1), unless the authorization is terminated  or revoked sooner.       Influenza A by PCR NEGATIVE NEGATIVE   Influenza B by PCR NEGATIVE NEGATIVE    Comment: (NOTE) The Xpert Xpress SARS-CoV-2/FLU/RSV plus assay is intended as an aid in the diagnosis of influenza from Nasopharyngeal swab specimens and should not be used as a sole basis for treatment. Nasal washings and aspirates are unacceptable for Xpert Xpress SARS-CoV-2/FLU/RSV testing.  Fact Sheet for Patients: BloggerCourse.com  Fact Sheet for Healthcare Providers: SeriousBroker.it  This test is not yet approved or cleared by the [place] FDA and has been authorized for detection and/or diagnosis of SARS-CoV-2 by FDA under an Emergency Use Authorization (EUA). This EUA will remain in effect (meaning this test can be used) for the duration of the COVID-19 declaration under Section 564(b)(1) of the Act, 21 U.S.C. section 360bbb-3(b)(1), unless the authorization is terminated or revoked.     Resp Syncytial Virus by PCR NEGATIVE NEGATIVE    Comment: (NOTE) Fact Sheet for Patients: BloggerCourse.com  Fact Sheet for Healthcare  Providers: SeriousBroker.it  This test is not yet approved or cleared by the [place] FDA and has been authorized for detection and/or diagnosis of SARS-CoV-2 by FDA under an Emergency Use Authorization (EUA). This EUA will remain in effect (meaning this test  can be used) for the duration of the COVID-19 declaration under Section 564(b)(1) of the Act, 21 U.S.C. section 360bbb-3(b)(1), unless the authorization is terminated or revoked.  Performed at [facility], [address], [place], [place] [address]    CT Angio Chest PE W and/or Wo Contrast  Result Date: 07/02/2022 CLINICAL DATA:  Acute abdominal pain, chest pain with inspiration, lower abdominal pain, shortness of breath EXAM: CT ANGIOGRAPHY CHEST CT ABDOMEN AND PELVIS WITH CONTRAST TECHNIQUE: Multidetector CT imaging of the chest was performed using the standard protocol during bolus administration of intravenous contrast. Multiplanar CT image reconstructions and MIPs were obtained to evaluate the vascular anatomy. Multidetector CT imaging of the abdomen and pelvis was performed using the standard protocol during bolus administration of intravenous contrast. RADIATION DOSE REDUCTION: This exam was performed according to the departmental dose-optimization program which includes automated exposure control, adjustment of the mA and/or kV according to patient size and/or use of iterative reconstruction technique. CONTRAST:  OMNIPAQUE IOHEXOL 350 MG/ML SOLN COMPARISON:  06/29/2022 FINDINGS: CTA CHEST FINDINGS Cardiovascular: This is a technically adequate evaluation of the pulmonary vasculature. No filling defects or pulmonary emboli. The heart is unremarkable without pericardial effusion. Diffuse atherosclerosis throughout the coronary vasculature. No evidence of thoracic aortic aneurysm or dissection. Atherosclerosis of the descending thoracic aorta. Mediastinum/Nodes: No enlarged  mediastinal, hilar, or axillary lymph nodes. Thyroid gland, trachea, and esophagus demonstrate no significant findings. Lungs/Pleura: There is progressive bilateral lower lobe consolidation since the prior exam, consistent with worsening pneumonia and/or atelectasis. Small loculated left pleural effusion is unchanged. No pneumothorax. Stable upper lobe predominant emphysema. Central airways are patent. Musculoskeletal: Stable left lateral seventh and eighth rib fractures. No other acute bony abnormalities. Reconstructed images demonstrate no additional findings. Review of the MIP images confirms the above findings. CT ABDOMEN and PELVIS FINDINGS Hepatobiliary: No focal liver abnormality is seen. No gallstones, gallbladder wall thickening, or biliary dilatation. Pancreas: Unremarkable. No pancreatic ductal dilatation or surrounding inflammatory changes. Spleen: Normal in size without focal abnormality. Adrenals/Urinary Tract: Stable appearance of the bilateral kidneys. No urinary tract calculi or obstructive uropathy. The adrenals and bladder are unremarkable. Stomach/Bowel: No bowel obstruction or ileus. Sigmoid diverticulosis is again noted. There has been near complete resolution of the wall thickening and pericolonic fat stranding within the mid sigmoid colon seen previously, consistent with improving diverticulitis. No perforation, fluid collection, or abscess. There is moderate retained stool throughout the colon consistent with constipation. Vascular/Lymphatic: Aortic atherosclerosis. No enlarged abdominal or pelvic lymph nodes. Reproductive: Stable enlargement of the prostate. Other: No free fluid or free intraperitoneal gas. Small fat containing left inguinal hernia. Musculoskeletal: No acute or destructive bony lesions. Reconstructed images demonstrate no additional findings. Review of the MIP images confirms the above findings. IMPRESSION: Chest: 1. No evidence of pulmonary embolus. 2. Progressive  bilateral lower lobe consolidation, consistent with worsening pneumonia and/or atelectasis. 3. Stable small loculated left pleural effusion. 4. Aortic Atherosclerosis (ICD10-I70.0) and Emphysema (ICD10-J43.9). 5. Stable left lateral seventh and eighth rib fractures. Abdomen/pelvis: 1. Significant improvement in the acute uncomplicated sigmoid diverticulosis seen previously. Marked improvement in wall thickening and pericolonic fat stranding. 2. Marked fecal retention throughout the colon consistent with constipation. No bowel obstruction or ileus. 3. Stable enlarged prostate. 4.  Aortic Atherosclerosis (ICD10-I70.0). Electronically Signed   By: Sharlet Salina M.D.   On: 07/02/2022 17:41   CT ABDOMEN PELVIS W CONTRAST  Result Date: 07/02/2022 CLINICAL DATA:  Acute abdominal pain, chest pain with inspiration, lower abdominal  pain, shortness of breath EXAM: CT ANGIOGRAPHY CHEST CT ABDOMEN AND PELVIS WITH CONTRAST TECHNIQUE: Multidetector CT imaging of the chest was performed using the standard protocol during bolus administration of intravenous contrast. Multiplanar CT image reconstructions and MIPs were obtained to evaluate the vascular anatomy. Multidetector CT imaging of the abdomen and pelvis was performed using the standard protocol during bolus administration of intravenous contrast. RADIATION DOSE REDUCTION: This exam was performed according to the departmental dose-optimization program which includes automated exposure control, adjustment of the mA and/or kV according to patient size and/or use of iterative reconstruction technique. CONTRAST:  OMNIPAQUE IOHEXOL 350 MG/ML SOLN COMPARISON:  06/29/2022 FINDINGS: CTA CHEST FINDINGS Cardiovascular: This is a technically adequate evaluation of the pulmonary vasculature. No filling defects or pulmonary emboli. The heart is unremarkable without pericardial effusion. Diffuse atherosclerosis throughout the coronary vasculature. No evidence of thoracic aortic  aneurysm or dissection. Atherosclerosis of the descending thoracic aorta. Mediastinum/Nodes: No enlarged mediastinal, hilar, or axillary lymph nodes. Thyroid gland, trachea, and esophagus demonstrate no significant findings. Lungs/Pleura: There is progressive bilateral lower lobe consolidation since the prior exam, consistent with worsening pneumonia and/or atelectasis. Small loculated left pleural effusion is unchanged. No pneumothorax. Stable upper lobe predominant emphysema. Central airways are patent. Musculoskeletal: Stable left lateral seventh and eighth rib fractures. No other acute bony abnormalities. Reconstructed images demonstrate no additional findings. Review of the MIP images confirms the above findings. CT ABDOMEN and PELVIS FINDINGS Hepatobiliary: No focal liver abnormality is seen. No gallstones, gallbladder wall thickening, or biliary dilatation. Pancreas: Unremarkable. No pancreatic ductal dilatation or surrounding inflammatory changes. Spleen: Normal in size without focal abnormality. Adrenals/Urinary Tract: Stable appearance of the bilateral kidneys. No urinary tract calculi or obstructive uropathy. The adrenals and bladder are unremarkable. Stomach/Bowel: No bowel obstruction or ileus. Sigmoid diverticulosis is again noted. There has been near complete resolution of the wall thickening and pericolonic fat stranding within the mid sigmoid colon seen previously, consistent with improving diverticulitis. No perforation, fluid collection, or abscess. There is moderate retained stool throughout the colon consistent with constipation. Vascular/Lymphatic: Aortic atherosclerosis. No enlarged abdominal or pelvic lymph nodes. Reproductive: Stable enlargement of the prostate. Other: No free fluid or free intraperitoneal gas. Small fat containing left inguinal hernia. Musculoskeletal: No acute or destructive bony lesions. Reconstructed images demonstrate no additional findings. Review of the MIP images  confirms the above findings. IMPRESSION: Chest: 1. No evidence of pulmonary embolus. 2. Progressive bilateral lower lobe consolidation, consistent with worsening pneumonia and/or atelectasis. 3. Stable small loculated left pleural effusion. 4. Aortic Atherosclerosis (ICD10-I70.0) and Emphysema (ICD10-J43.9). 5. Stable left lateral seventh and eighth rib fractures. Abdomen/pelvis: 1. Significant improvement in the acute uncomplicated sigmoid diverticulosis seen previously. Marked improvement in wall thickening and pericolonic fat stranding. 2. Marked fecal retention throughout the colon consistent with constipation. No bowel obstruction or ileus. 3. Stable enlarged prostate. 4.  Aortic Atherosclerosis (ICD10-I70.0). Electronically Signed   By: Sharlet Salina M.D.   On: 07/02/2022 17:41    Pending Labs Unresulted Labs (From admission, onward)    None       Vitals/Pain Today's Vitals   07/02/22 1548 07/02/22 1600 07/02/22 1613  BP: (!) 147/81 (!) 147/90   Pulse: 92 94   Resp: 18 12   Temp: 98.3 F (36.8 C)    TempSrc: Oral    SpO2: 91% 92%   Weight:   300 lb (136.1 kg)  Height:   6\' 2"  (1.88 m)  PainSc:   0-No pain  Isolation Precautions No active isolations  Medications Medications  cefTRIAXone (ROCEPHIN) 1 g in sodium chloride 0.9 % 100 mL IVPB (1 g Intravenous New Bag/Given 07/02/22 1850)  azithromycin (ZITHROMAX) 500 mg in sodium chloride 0.9 % 250 mL IVPB (has no administration in time range)  iohexol (OMNIPAQUE) 350 MG/ML injection 100 mL (100 mLs Intravenous Contrast Given 07/02/22 1710)    Mobility walks     Focused Assessments Pulmonary Assessment Handoff:  Lung sounds: Bilateral Breath Sounds: Diminished, Clear L Breath Sounds: Clear, Diminished R Breath Sounds: Diminished, Clear O2 Device: Nasal Cannula O2 Flow Rate (L/min): 3 L/min    R Recommendations: See Admitting Provider Note  Report given to:   Additional Notes:

## 2022-07-02 NOTE — Assessment & Plan Note (Addendum)
No signs of exacerbation. Clear on exam  Continue maintenance inhaler SABA prn

## 2022-07-02 NOTE — Assessment & Plan Note (Addendum)
OxyContin filled 05/2022 for #60 pills Was on tramadol for most of fall 2023  Recently seen at pain clinic for his oxy, but tells me he doesn't take it often Will continue PRN here  Unclear if still on gabapentin, appears discontinued in [place] note, but waiting on fax

## 2022-07-02 NOTE — Assessment & Plan Note (Addendum)
Per patient as low as 69% on his home 3L oxygen, but he has been in the 90s even with movement and standing here in ED while on 3L oxygen.  No recorded events of hypoxia while in ED Covid/flu/rsv negative. Troponin wnl and bnp wnl  Possible bilateral lower lobe pneumonia vs. Atelectasis  Continue to monitor, but stable on his home 3L oxygen

## 2022-07-02 NOTE — Assessment & Plan Note (Signed)
Continue cardizem and lisinopril

## 2022-07-02 NOTE — Assessment & Plan Note (Addendum)
Appears to no longer be on cellcept F/u with med rec

## 2022-07-02 NOTE — Assessment & Plan Note (Signed)
Continue flomax daily.  

## 2022-07-03 ENCOUNTER — Other Ambulatory Visit: Payer: Self-pay

## 2022-07-03 DIAGNOSIS — Z7901 Long term (current) use of anticoagulants: Secondary | ICD-10-CM | POA: Diagnosis not present

## 2022-07-03 DIAGNOSIS — I2510 Atherosclerotic heart disease of native coronary artery without angina pectoris: Secondary | ICD-10-CM | POA: Diagnosis present

## 2022-07-03 DIAGNOSIS — J189 Pneumonia, unspecified organism: Secondary | ICD-10-CM

## 2022-07-03 DIAGNOSIS — N400 Enlarged prostate without lower urinary tract symptoms: Secondary | ICD-10-CM | POA: Diagnosis present

## 2022-07-03 DIAGNOSIS — Z9151 Personal history of suicidal behavior: Secondary | ICD-10-CM | POA: Diagnosis not present

## 2022-07-03 DIAGNOSIS — E1122 Type 2 diabetes mellitus with diabetic chronic kidney disease: Secondary | ICD-10-CM | POA: Diagnosis present

## 2022-07-03 DIAGNOSIS — E669 Obesity, unspecified: Secondary | ICD-10-CM | POA: Diagnosis present

## 2022-07-03 DIAGNOSIS — R0602 Shortness of breath: Secondary | ICD-10-CM | POA: Diagnosis present

## 2022-07-03 DIAGNOSIS — Z85828 Personal history of other malignant neoplasm of skin: Secondary | ICD-10-CM | POA: Diagnosis not present

## 2022-07-03 DIAGNOSIS — I252 Old myocardial infarction: Secondary | ICD-10-CM | POA: Diagnosis not present

## 2022-07-03 DIAGNOSIS — Z87891 Personal history of nicotine dependence: Secondary | ICD-10-CM | POA: Diagnosis not present

## 2022-07-03 DIAGNOSIS — Z1152 Encounter for screening for COVID-19: Secondary | ICD-10-CM | POA: Diagnosis not present

## 2022-07-03 DIAGNOSIS — F419 Anxiety disorder, unspecified: Secondary | ICD-10-CM | POA: Diagnosis present

## 2022-07-03 DIAGNOSIS — Z7984 Long term (current) use of oral hypoglycemic drugs: Secondary | ICD-10-CM | POA: Diagnosis not present

## 2022-07-03 DIAGNOSIS — J9611 Chronic respiratory failure with hypoxia: Secondary | ICD-10-CM | POA: Diagnosis present

## 2022-07-03 DIAGNOSIS — F339 Major depressive disorder, recurrent, unspecified: Secondary | ICD-10-CM | POA: Diagnosis present

## 2022-07-03 DIAGNOSIS — G894 Chronic pain syndrome: Secondary | ICD-10-CM | POA: Diagnosis present

## 2022-07-03 DIAGNOSIS — G4733 Obstructive sleep apnea (adult) (pediatric): Secondary | ICD-10-CM | POA: Diagnosis present

## 2022-07-03 DIAGNOSIS — Z923 Personal history of irradiation: Secondary | ICD-10-CM | POA: Diagnosis not present

## 2022-07-03 DIAGNOSIS — I129 Hypertensive chronic kidney disease with stage 1 through stage 4 chronic kidney disease, or unspecified chronic kidney disease: Secondary | ICD-10-CM | POA: Diagnosis present

## 2022-07-03 DIAGNOSIS — C3492 Malignant neoplasm of unspecified part of left bronchus or lung: Secondary | ICD-10-CM | POA: Diagnosis present

## 2022-07-03 DIAGNOSIS — C799 Secondary malignant neoplasm of unspecified site: Secondary | ICD-10-CM | POA: Diagnosis present

## 2022-07-03 DIAGNOSIS — K5900 Constipation, unspecified: Secondary | ICD-10-CM | POA: Diagnosis present

## 2022-07-03 DIAGNOSIS — N1831 Chronic kidney disease, stage 3a: Secondary | ICD-10-CM | POA: Diagnosis present

## 2022-07-03 DIAGNOSIS — J440 Chronic obstructive pulmonary disease with acute lower respiratory infection: Secondary | ICD-10-CM | POA: Diagnosis present

## 2022-07-03 LAB — GLUCOSE, CAPILLARY
Glucose-Capillary: 112 mg/dL — ABNORMAL HIGH (ref 70–99)
Glucose-Capillary: 151 mg/dL — ABNORMAL HIGH (ref 70–99)
Glucose-Capillary: 151 mg/dL — ABNORMAL HIGH (ref 70–99)
Glucose-Capillary: 86 mg/dL (ref 70–99)
Glucose-Capillary: 91 mg/dL (ref 70–99)

## 2022-07-03 LAB — URINALYSIS, ROUTINE W REFLEX MICROSCOPIC
Bilirubin Urine: NEGATIVE
Glucose, UA: NEGATIVE mg/dL
Hgb urine dipstick: NEGATIVE
Ketones, ur: NEGATIVE mg/dL
Leukocytes,Ua: NEGATIVE
Nitrite: NEGATIVE
Protein, ur: NEGATIVE mg/dL
Specific Gravity, Urine: 1.017 (ref 1.005–1.030)
pH: 6 (ref 5.0–8.0)

## 2022-07-03 LAB — BASIC METABOLIC PANEL
Anion gap: 10 (ref 5–15)
BUN: 13 mg/dL (ref 8–23)
CO2: 25 mmol/L (ref 22–32)
Calcium: 8.4 mg/dL — ABNORMAL LOW (ref 8.9–10.3)
Chloride: 102 mmol/L (ref 98–111)
Creatinine, Ser: 0.98 mg/dL (ref 0.61–1.24)
GFR, Estimated: >60 mL/min (ref >60)
Glucose, Bld: 112 mg/dL — ABNORMAL HIGH (ref 70–99)
Potassium: 4.1 mmol/L (ref 3.5–5.1)
Sodium: 137 mmol/L (ref 135–145)

## 2022-07-03 LAB — CBC
HCT: 38.7 % — ABNORMAL LOW (ref 39.0–52.0)
Hemoglobin: 12.5 g/dL — ABNORMAL LOW (ref 13.0–17.0)
MCH: 29.3 pg (ref 26.0–34.0)
MCHC: 32.3 g/dL (ref 30.0–36.0)
MCV: 90.6 fL (ref 80.0–100.0)
Platelets: 327 10*3/uL (ref 150–400)
RBC: 4.27 MIL/uL (ref 4.22–5.81)
RDW: 17.3 % — ABNORMAL HIGH (ref 11.5–15.5)
WBC: 7.7 10*3/uL (ref 4.0–10.5)
nRBC: 0 % (ref 0.0–0.2)

## 2022-07-03 LAB — STREP PNEUMONIAE URINARY ANTIGEN: Strep Pneumo Urinary Antigen: NEGATIVE

## 2022-07-03 MED ORDER — POLYETHYLENE GLYCOL 3350 17 G PO PACK
17.0000 g | PACK | Freq: Every day | ORAL | Status: DC
  Administered 2022-07-04 – 2022-07-05 (×2): 17 g via ORAL
  Filled 2022-07-03 (×2): qty 1

## 2022-07-03 MED ORDER — LISINOPRIL 20 MG PO TABS
20.0000 mg | ORAL_TABLET | Freq: Every day | ORAL | Status: DC
  Administered 2022-07-03 – 2022-07-05 (×3): 20 mg via ORAL
  Filled 2022-07-03 (×3): qty 1

## 2022-07-03 MED ORDER — OXYCODONE HCL 5 MG PO TABS
5.0000 mg | ORAL_TABLET | ORAL | Status: DC | PRN
  Administered 2022-07-03: 5 mg via ORAL
  Filled 2022-07-03: qty 1

## 2022-07-03 MED ORDER — OXYCODONE HCL 5 MG PO TABS
10.0000 mg | ORAL_TABLET | ORAL | Status: DC | PRN
  Administered 2022-07-03 – 2022-07-04 (×4): 10 mg via ORAL
  Filled 2022-07-03 (×4): qty 2

## 2022-07-03 MED ORDER — PREDNISOLONE ACETATE 1 % OP SUSP
1.0000 [drp] | OPHTHALMIC | Status: AC
  Administered 2022-07-03 – 2022-07-04 (×18): 1 [drp] via OPHTHALMIC
  Filled 2022-07-03: qty 5

## 2022-07-03 MED ORDER — SENNOSIDES-DOCUSATE SODIUM 8.6-50 MG PO TABS
1.0000 | ORAL_TABLET | Freq: Two times a day (BID) | ORAL | Status: DC
  Administered 2022-07-03 – 2022-07-05 (×4): 1 via ORAL
  Filled 2022-07-03 (×4): qty 1

## 2022-07-03 MED ORDER — MYCOPHENOLATE MOFETIL 250 MG PO CAPS
500.0000 mg | ORAL_CAPSULE | Freq: Two times a day (BID) | ORAL | Status: DC
  Administered 2022-07-03 – 2022-07-05 (×4): 500 mg via ORAL
  Filled 2022-07-03 (×4): qty 2

## 2022-07-03 MED ORDER — APIXABAN 5 MG PO TABS
5.0000 mg | ORAL_TABLET | Freq: Two times a day (BID) | ORAL | Status: DC
  Administered 2022-07-03 – 2022-07-05 (×4): 5 mg via ORAL
  Filled 2022-07-03 (×4): qty 1

## 2022-07-03 MED ORDER — DULOXETINE HCL 30 MG PO CPEP
60.0000 mg | ORAL_CAPSULE | Freq: Two times a day (BID) | ORAL | Status: DC
  Administered 2022-07-03 – 2022-07-05 (×6): 60 mg via ORAL
  Filled 2022-07-03 (×7): qty 2

## 2022-07-03 MED ORDER — DILTIAZEM HCL ER COATED BEADS 180 MG PO CP24
180.0000 mg | ORAL_CAPSULE | Freq: Every day | ORAL | Status: DC
  Administered 2022-07-03 – 2022-07-05 (×3): 180 mg via ORAL
  Filled 2022-07-03 (×3): qty 1

## 2022-07-03 MED ORDER — METHOCARBAMOL 500 MG PO TABS
750.0000 mg | ORAL_TABLET | Freq: Four times a day (QID) | ORAL | Status: DC | PRN
  Administered 2022-07-03: 750 mg via ORAL
  Filled 2022-07-03: qty 2

## 2022-07-03 NOTE — Progress Notes (Signed)
RT note: PT. declined CPAP/BiPAP use, aware to notify.

## 2022-07-03 NOTE — Progress Notes (Signed)
PROGRESS NOTE  Mario [name]  ZOX:[id number] DOB: 1951/02/07 DOA: 07/02/2022 PCP: [name]   Brief Narrative:  Patient is a 72 year old male with history of  non-small cell lung cancer status post radiation treatment, malignant pleural effusion status post left Pleurx catheter removed on 09/01/2021, vasculitis, aortic stenosis, PE on Eliquis, COPD, chronic hypoxic respiratory failure on 3 L of oxygen, hypertension, coronary artery disease, OSA, type 2 diabetes, CKD stage IIIa who presented to the emergency department with complaint of shortness of breath, acute on chronic hypoxia saturating down to 69% at home.  Patient was dyspneic on minimal exertion.  On presentation, he was hemodynamically stable, saturating 92% on 3 L of oxygen.  CTA chest did not show any PE but showed progressive bilateral lower lobe consolidation consistent with worsening pneumonia and/or atelectasis, stable loculated left pleural effusion.  CT abdomen/pelvis showed fecal retention throughout the colon consistent with constipation.  Patient was started on broad-spectrum antibiotics for management of community-acquired pneumonia.  Assessment & Plan:  Principal Problem:   Community acquired pneumonia Active Problems:   Acute on chronic respiratory failure with hypoxia (HCC)   DM2 (diabetes mellitus, type 2) (HCC)   COPD (chronic obstructive pulmonary disease) (HCC)   Non-small cell cancer of left lung (HCC)   Hypertension   history of Pulmonary embolism (HCC)   Chronic pain   Vasculitis (HCC)   BPH (benign prostatic hyperplasia)   Stage 3a chronic kidney disease (HCC)   Major depressive disorder, recurrent episode (HCC)   OSA (obstructive sleep apnea)  Community-acquired pneumonia: Presented with dyspnea on minimal exertion.  Found to have bilateral lower lobe consolidation though atelectasis cudnt be ruled out.  Started on ceftriaxone, azithromycin.  No fever, cough or leukocytosis.  Lactic acid level  normal.  Procalcitonin nonreassuring.  COVID/flu/RSV negative. Patient feels better today.  Acute on chronic hypoxic respiratory failure: Uses 3 L of oxygen at home.  Currently on the same.  History of stage IV lung cancer: Previously following with Dr. [name], had second opinion at [facility].  Patient states that his cancer is in remission.  He was treated only with radiation therapy and was not a candidate for chemotherapy due to his multiple medical problems.  He has an appointment with the [facility] oncology soon in June  History of PE: Was taking Eliquis earlier, not taking currently.  CT did not show any evidence of PE.  History of hypertension: On lisinopril, Cardizem  History of vasculitis: Previously taking CellCept  BPH: Continue Flomax  Stage IIIa CKD: Current renal function stable.  History of depression: On Cymbalta  OSA: Uses CPAP at night  Chronic pain syndrome: Continue current medications.  On Cymbalta  COPD: No signs of exacerbation.  Continue bronchodilators as ordered.  Diabetes type 2: Last A1c of 6.3.  Monitor blood sugars.  Continue sliding scale insulin.  Debility/deconditioning: PT consulted  Obesity: BMI of 38.5         DVT prophylaxis:Lovenox     Code Status: Full Code  Family Communication: None at bedside  Patient status:Inpatient  Patient is from :Home  Anticipated discharge WJ:XBJY  Estimated DC date:1-2 days   Consultants: None  Procedures:None  Antimicrobials:  Anti-infectives (From admission, onward)    Start     Dose/Rate Route Frequency Ordered Stop   07/03/22 1800  cefTRIAXone (ROCEPHIN) 1 g in sodium chloride 0.9 % 100 mL IVPB        1 g 200 mL/hr over 30 Minutes Intravenous Every 24 hours 07/02/22  1957 07/07/22 1759   07/02/22 2200  azithromycin (ZITHROMAX) 500 mg in sodium chloride 0.9 % 250 mL IVPB        500 mg 250 mL/hr over 60 Minutes Intravenous Every 24 hours 07/02/22 1957 07/07/22 2159   07/02/22 1830  cefTRIAXone  (ROCEPHIN) 1 g in sodium chloride 0.9 % 100 mL IVPB        1 g 200 mL/hr over 30 Minutes Intravenous  Once 07/02/22 1823 07/02/22 2010   07/02/22 1830  azithromycin (ZITHROMAX) 500 mg in sodium chloride 0.9 % 250 mL IVPB  Status:  Discontinued        500 mg 250 mL/hr over 60 Minutes Intravenous  Once 07/02/22 1823 07/02/22 1957       Subjective: Patient seen and examined at bedside today.  He was lying on the bed.  He was complaining of some pain on the left shoulder, neck.  He denies any shortness of breath or cough.  He was on 3 L of oxygen which is his baseline.  He states he feels great regarding his breathing  Objective: Vitals:   07/02/22 2024 07/02/22 2158 07/03/22 0030 07/03/22 0435  BP: 133/73  132/78 126/65  Pulse: 77  81 83  Resp: 20  18 17   Temp: (!) 97.5 F (36.4 C)  97.9 F (36.6 C) 97.6 F (36.4 C)  TempSrc: Oral  Oral Oral  SpO2: 96% 96% 96% 93%  Weight:      Height:       No intake or output data in the 24 hours ending 07/03/22 0732 Filed Weights   07/02/22 1613  Weight: 136.1 kg    Examination:  General exam: Overall comfortable, not in distress, morbidly obese HEENT: PERRL Respiratory system: Mild diminished air sounds on the bases, no wheezes or crackles  Cardiovascular system: S1 & S2 heard, RRR.  Gastrointestinal system: Abdomen is distended, soft and nontender.BS present Central nervous system: Alert and oriented Extremities: trace bilateral lower extremity edema, no clubbing ,no cyanosis Skin: No rashes, no ulcers,no icterus     Data Reviewed: I have personally reviewed following labs and imaging studies  CBC: Recent Labs  Lab 06/29/22 2010 07/02/22 1635  WBC 10.3 8.2  NEUTROABS 7.1 5.6  HGB 13.2 12.3*  HCT 41.3 38.0*  MCV 89.6 90.9  PLT 341 331   Basic Metabolic Panel: Recent Labs  Lab 06/29/22 2010 07/02/22 1635  NA 134* 135  K 4.8 4.0  CL 100 101  CO2 24 23  GLUCOSE 89 161*  BUN 14 14  CREATININE 0.87 1.03  CALCIUM 9.2  8.4*     Recent Results (from the past 240 hour(s))  Resp panel by RT-PCR (RSV, Flu A&B, Covid) Anterior Nasal Swab     Status: None   Collection Time: 07/02/22  6:04 PM   Specimen: Anterior Nasal Swab  Result Value Ref Range Status   SARS Coronavirus 2 by RT PCR NEGATIVE NEGATIVE Final    Comment: (NOTE) SARS-CoV-2 target nucleic acids are NOT DETECTED.  The SARS-CoV-2 RNA is generally detectable in upper respiratory specimens during the acute phase of infection. The lowest concentration of SARS-CoV-2 viral copies this assay can detect is 138 copies/mL. A negative result does not preclude SARS-Cov-2 infection and should not be used as the sole basis for treatment or other patient management decisions. A negative result may occur with  improper specimen collection/handling, submission of specimen other than nasopharyngeal swab, presence of viral mutation(s) within the areas targeted by this assay,  and inadequate number of viral copies(<138 copies/mL). A negative result must be combined with clinical observations, patient history, and epidemiological information. The expected result is Negative.  Fact Sheet for Patients:  BloggerCourse.com  Fact Sheet for Healthcare Providers:  SeriousBroker.it  This test is no t yet approved or cleared by the [place] FDA and  has been authorized for detection and/or diagnosis of SARS-CoV-2 by FDA under an Emergency Use Authorization (EUA). This EUA will remain  in effect (meaning this test can be used) for the duration of the COVID-19 declaration under Section 564(b)(1) of the Act, 21 U.S.C.section 360bbb-3(b)(1), unless the authorization is terminated  or revoked sooner.       Influenza A by PCR NEGATIVE NEGATIVE Final   Influenza B by PCR NEGATIVE NEGATIVE Final    Comment: (NOTE) The Xpert Xpress SARS-CoV-2/FLU/RSV plus assay is intended as an aid in the diagnosis of influenza  from Nasopharyngeal swab specimens and should not be used as a sole basis for treatment. Nasal washings and aspirates are unacceptable for Xpert Xpress SARS-CoV-2/FLU/RSV testing.  Fact Sheet for Patients: BloggerCourse.com  Fact Sheet for Healthcare Providers: SeriousBroker.it  This test is not yet approved or cleared by the [place] FDA and has been authorized for detection and/or diagnosis of SARS-CoV-2 by FDA under an Emergency Use Authorization (EUA). This EUA will remain in effect (meaning this test can be used) for the duration of the COVID-19 declaration under Section 564(b)(1) of the Act, 21 U.S.C. section 360bbb-3(b)(1), unless the authorization is terminated or revoked.     Resp Syncytial Virus by PCR NEGATIVE NEGATIVE Final    Comment: (NOTE) Fact Sheet for Patients: BloggerCourse.com  Fact Sheet for Healthcare Providers: SeriousBroker.it  This test is not yet approved or cleared by the [place] FDA and has been authorized for detection and/or diagnosis of SARS-CoV-2 by FDA under an Emergency Use Authorization (EUA). This EUA will remain in effect (meaning this test can be used) for the duration of the COVID-19 declaration under Section 564(b)(1) of the Act, 21 U.S.C. section 360bbb-3(b)(1), unless the authorization is terminated or revoked.  Performed at [facility], [address], [place], [place] [address]      Radiology Studies: CT Angio Chest PE W and/or Wo Contrast  Result Date: 07/02/2022 CLINICAL DATA:  Acute abdominal pain, chest pain with inspiration, lower abdominal pain, shortness of breath EXAM: CT ANGIOGRAPHY CHEST CT ABDOMEN AND PELVIS WITH CONTRAST TECHNIQUE: Multidetector CT imaging of the chest was performed using the standard protocol during bolus administration of intravenous contrast. Multiplanar CT image  reconstructions and MIPs were obtained to evaluate the vascular anatomy. Multidetector CT imaging of the abdomen and pelvis was performed using the standard protocol during bolus administration of intravenous contrast. RADIATION DOSE REDUCTION: This exam was performed according to the departmental dose-optimization program which includes automated exposure control, adjustment of the mA and/or kV according to patient size and/or use of iterative reconstruction technique. CONTRAST:  OMNIPAQUE IOHEXOL 350 MG/ML SOLN COMPARISON:  06/29/2022 FINDINGS: CTA CHEST FINDINGS Cardiovascular: This is a technically adequate evaluation of the pulmonary vasculature. No filling defects or pulmonary emboli. The heart is unremarkable without pericardial effusion. Diffuse atherosclerosis throughout the coronary vasculature. No evidence of thoracic aortic aneurysm or dissection. Atherosclerosis of the descending thoracic aorta. Mediastinum/Nodes: No enlarged mediastinal, hilar, or axillary lymph nodes. Thyroid gland, trachea, and esophagus demonstrate no significant findings. Lungs/Pleura: There is progressive bilateral lower lobe consolidation since the prior exam, consistent with worsening pneumonia  and/or atelectasis. Small loculated left pleural effusion is unchanged. No pneumothorax. Stable upper lobe predominant emphysema. Central airways are patent. Musculoskeletal: Stable left lateral seventh and eighth rib fractures. No other acute bony abnormalities. Reconstructed images demonstrate no additional findings. Review of the MIP images confirms the above findings. CT ABDOMEN and PELVIS FINDINGS Hepatobiliary: No focal liver abnormality is seen. No gallstones, gallbladder wall thickening, or biliary dilatation. Pancreas: Unremarkable. No pancreatic ductal dilatation or surrounding inflammatory changes. Spleen: Normal in size without focal abnormality. Adrenals/Urinary Tract: Stable appearance of the bilateral kidneys. No  urinary tract calculi or obstructive uropathy. The adrenals and bladder are unremarkable. Stomach/Bowel: No bowel obstruction or ileus. Sigmoid diverticulosis is again noted. There has been near complete resolution of the wall thickening and pericolonic fat stranding within the mid sigmoid colon seen previously, consistent with improving diverticulitis. No perforation, fluid collection, or abscess. There is moderate retained stool throughout the colon consistent with constipation. Vascular/Lymphatic: Aortic atherosclerosis. No enlarged abdominal or pelvic lymph nodes. Reproductive: Stable enlargement of the prostate. Other: No free fluid or free intraperitoneal gas. Small fat containing left inguinal hernia. Musculoskeletal: No acute or destructive bony lesions. Reconstructed images demonstrate no additional findings. Review of the MIP images confirms the above findings. IMPRESSION: Chest: 1. No evidence of pulmonary embolus. 2. Progressive bilateral lower lobe consolidation, consistent with worsening pneumonia and/or atelectasis. 3. Stable small loculated left pleural effusion. 4. Aortic Atherosclerosis (ICD10-I70.0) and Emphysema (ICD10-J43.9). 5. Stable left lateral seventh and eighth rib fractures. Abdomen/pelvis: 1. Significant improvement in the acute uncomplicated sigmoid diverticulosis seen previously. Marked improvement in wall thickening and pericolonic fat stranding. 2. Marked fecal retention throughout the colon consistent with constipation. No bowel obstruction or ileus. 3. Stable enlarged prostate. 4.  Aortic Atherosclerosis (ICD10-I70.0). Electronically Signed   By: Sharlet Salina M.D.   On: 07/02/2022 17:41   CT ABDOMEN PELVIS W CONTRAST  Result Date: 07/02/2022 CLINICAL DATA:  Acute abdominal pain, chest pain with inspiration, lower abdominal pain, shortness of breath EXAM: CT ANGIOGRAPHY CHEST CT ABDOMEN AND PELVIS WITH CONTRAST TECHNIQUE: Multidetector CT imaging of the chest was performed  using the standard protocol during bolus administration of intravenous contrast. Multiplanar CT image reconstructions and MIPs were obtained to evaluate the vascular anatomy. Multidetector CT imaging of the abdomen and pelvis was performed using the standard protocol during bolus administration of intravenous contrast. RADIATION DOSE REDUCTION: This exam was performed according to the departmental dose-optimization program which includes automated exposure control, adjustment of the mA and/or kV according to patient size and/or use of iterative reconstruction technique. CONTRAST:  OMNIPAQUE IOHEXOL 350 MG/ML SOLN COMPARISON:  06/29/2022 FINDINGS: CTA CHEST FINDINGS Cardiovascular: This is a technically adequate evaluation of the pulmonary vasculature. No filling defects or pulmonary emboli. The heart is unremarkable without pericardial effusion. Diffuse atherosclerosis throughout the coronary vasculature. No evidence of thoracic aortic aneurysm or dissection. Atherosclerosis of the descending thoracic aorta. Mediastinum/Nodes: No enlarged mediastinal, hilar, or axillary lymph nodes. Thyroid gland, trachea, and esophagus demonstrate no significant findings. Lungs/Pleura: There is progressive bilateral lower lobe consolidation since the prior exam, consistent with worsening pneumonia and/or atelectasis. Small loculated left pleural effusion is unchanged. No pneumothorax. Stable upper lobe predominant emphysema. Central airways are patent. Musculoskeletal: Stable left lateral seventh and eighth rib fractures. No other acute bony abnormalities. Reconstructed images demonstrate no additional findings. Review of the MIP images confirms the above findings. CT ABDOMEN and PELVIS FINDINGS Hepatobiliary: No focal liver abnormality is seen. No gallstones, gallbladder wall thickening, or biliary  dilatation. Pancreas: Unremarkable. No pancreatic ductal dilatation or surrounding inflammatory changes. Spleen: Normal in size  without focal abnormality. Adrenals/Urinary Tract: Stable appearance of the bilateral kidneys. No urinary tract calculi or obstructive uropathy. The adrenals and bladder are unremarkable. Stomach/Bowel: No bowel obstruction or ileus. Sigmoid diverticulosis is again noted. There has been near complete resolution of the wall thickening and pericolonic fat stranding within the mid sigmoid colon seen previously, consistent with improving diverticulitis. No perforation, fluid collection, or abscess. There is moderate retained stool throughout the colon consistent with constipation. Vascular/Lymphatic: Aortic atherosclerosis. No enlarged abdominal or pelvic lymph nodes. Reproductive: Stable enlargement of the prostate. Other: No free fluid or free intraperitoneal gas. Small fat containing left inguinal hernia. Musculoskeletal: No acute or destructive bony lesions. Reconstructed images demonstrate no additional findings. Review of the MIP images confirms the above findings. IMPRESSION: Chest: 1. No evidence of pulmonary embolus. 2. Progressive bilateral lower lobe consolidation, consistent with worsening pneumonia and/or atelectasis. 3. Stable small loculated left pleural effusion. 4. Aortic Atherosclerosis (ICD10-I70.0) and Emphysema (ICD10-J43.9). 5. Stable left lateral seventh and eighth rib fractures. Abdomen/pelvis: 1. Significant improvement in the acute uncomplicated sigmoid diverticulosis seen previously. Marked improvement in wall thickening and pericolonic fat stranding. 2. Marked fecal retention throughout the colon consistent with constipation. No bowel obstruction or ileus. 3. Stable enlarged prostate. 4.  Aortic Atherosclerosis (ICD10-I70.0). Electronically Signed   By: Sharlet Salina M.D.   On: 07/02/2022 17:41    Scheduled Meds:  arformoterol  15 mcg Nebulization BID   And   umeclidinium bromide  1 puff Inhalation Daily   cycloSPORINE  1 drop Both Eyes Q12H   diltiazem  180 mg Oral Daily   docusate  sodium  100 mg Oral BID   DULoxetine  60 mg Oral BID   enoxaparin (LOVENOX) injection  60 mg Subcutaneous Q24H   insulin aspart  0-15 Units Subcutaneous TID WC   ipratropium-albuterol  3 mL Nebulization Q6H   lisinopril  20 mg Oral Daily   oxcarbazepine  600 mg Oral TID   prednisoLONE acetate  1 drop Both Eyes Q2H while awake   senna-docusate  1 tablet Oral Daily   tamsulosin  0.8 mg Oral Daily   traZODone  75 mg Oral QHS   Continuous Infusions:  azithromycin 500 mg (07/02/22 2152)   cefTRIAXone (ROCEPHIN)  IV       LOS: 0 days   Burnadette Pop, MD [facility] P5/06/2022, 7:32 AM

## 2022-07-03 NOTE — Evaluation (Signed)
Physical Therapy Evaluation Patient Details Name: Mario [name] MRN: [id number] DOB: 1950/06/17 Today's Date: 07/03/2022  History of Present Illness  72 year old male who presented to the emergency department with complaint of shortness of breath, acute on chronic hypoxia saturating down to 69% at home and admitted on 07/02/22 for Community-acquired pneumonia.  Past medical history significant for non-small cell lung cancer status post radiation treatment, malignant pleural effusion status post left Pleurx catheter removed on 09/01/2021, vasculitis, aortic stenosis, PE on Eliquis, COPD, chronic hypoxic respiratory failure on 3 L of oxygen, hypertension, coronary artery disease, OSA, type 2 diabetes, CKD stage IIIa  Clinical Impression  Pt admitted with above diagnosis.  Pt currently with functional limitations due to the deficits listed below (see PT Problem List). Pt will benefit from acute skilled PT to increase their independence and safety with mobility to allow discharge.   Pt provided with SPC as he has been using cane at home due to back pain (which he reports is chronic).  Pt ambulated 100 feet and able to remain on 3L O2 Mario [name] (his baseline) with SPO2 91%.          Recommendations for follow up therapy are one component of a multi-disciplinary discharge planning process, led by the attending physician.  Recommendations may be updated based on patient status, additional functional criteria and insurance authorization.  Follow Up Recommendations       Assistance Recommended at Discharge PRN  Patient can return home with the following  Assistance with cooking/housework    Equipment Recommendations None recommended by PT  Recommendations for Other Services       Functional Status Assessment Patient has had a recent decline in their functional status and demonstrates the ability to make significant improvements in function in a reasonable and predictable amount of time.     Precautions /  Restrictions Precautions Precautions: Fall Precaution Comments: 3L O2 baseline      Mobility  Bed Mobility               General bed mobility comments: pt sitting EOB on arrival    Transfers Overall transfer level: Needs assistance Equipment used: Straight cane Transfers: Sit to/from Stand Sit to Stand: Supervision                Ambulation/Gait Ambulation/Gait assistance: Min guard, Supervision Gait Distance (Feet): 100 Feet Assistive device: Straight cane Gait Pattern/deviations: Step-through pattern, Decreased stride length       General Gait Details: uses SPC in right hand, one standing rest break due to fatigue, SPO2 91% on 3L O2 , pt reported legs "giving out" so returned to room after rest break  Stairs            Wheelchair Mobility    Modified Rankin (Stroke Patients Only)       Balance Overall balance assessment: Needs assistance         Standing balance support: No upper extremity supported Standing balance-Leahy Scale: Good                               Pertinent Vitals/Pain Pain Assessment Pain Assessment: Faces Faces Pain Scale: Hurts little more Pain Location: back - repports chronic Pain Intervention(s): Repositioned, Monitored during session    Home Living Family/patient expects to be discharged to:: Private residence Living Arrangements: Non-relatives/Friends Available Help at Discharge: Friend(s) Type of Home: House Home Access: Level entry     Alternate Level Stairs-Number  of Steps: 5 Home Layout: Two level Home Equipment: Cane - single point;Shower Counsellor (2 wheels);Rollator (4 wheels) Additional Comments: pt reports he rents a room in a home with a few other roommates; goes up/down 5 steps to kitchen/living room area. bedroom on 2nd level of 5 steps. wears 3L O2 at baseline    Prior Function Prior Level of Function : Independent/Modified Independent             Mobility  Comments: SPC lately for back pain       Hand Dominance        Extremity/Trunk Assessment        Lower Extremity Assessment Lower Extremity Assessment: Generalized weakness       Communication   Communication: No difficulties  Cognition Arousal/Alertness: Awake/alert Behavior During Therapy: WFL for tasks assessed/performed, Flat affect Overall Cognitive Status: Within Functional Limits for tasks assessed                                          General Comments      Exercises     Assessment/Plan    PT Assessment Patient needs continued PT services  PT Problem List Cardiopulmonary status limiting activity;Decreased activity tolerance;Decreased mobility       PT Treatment Interventions DME instruction;Gait training;Therapeutic exercise;Functional mobility training;Therapeutic activities;Patient/family education;Stair training    PT Goals (Current goals can be found in the Care Plan section)  Acute Rehab PT Goals PT Goal Formulation: With patient Time For Goal Achievement: 07/17/22 Potential to Achieve Goals: Good    Frequency Min 1X/week     Co-evaluation               AM-PAC PT "6 Clicks" Mobility  Outcome Measure Help needed turning from your back to your side while in a flat bed without using bedrails?: None Help needed moving from lying on your back to sitting on the side of a flat bed without using bedrails?: None Help needed moving to and from a bed to a chair (including a wheelchair)?: None Help needed standing up from a chair using your arms (e.g., wheelchair or bedside chair)?: None Help needed to walk in hospital room?: A Little Help needed climbing 3-5 steps with a railing? : A Little 6 Click Score: 22    End of Session Equipment Utilized During Treatment: Oxygen Activity Tolerance: Patient tolerated treatment well Patient left: in bed;with call bell/phone within reach Nurse Communication: Mobility status PT Visit  Diagnosis: Difficulty in walking, not elsewhere classified (R26.2)    Time: 5409-8119 PT Time Calculation (min) (ACUTE ONLY): 17 min   Charges:   PT Evaluation $PT Eval Low Complexity: 1 Low         Kati PT, DPT Physical Therapist Acute Rehabilitation Services Office: [phone]   Janan Halter [place] 07/03/2022, 4:23 PM

## 2022-07-03 NOTE — Progress Notes (Signed)
RT note: Pt. declined CPAP this shift, aware to notify in wanting one.

## 2022-07-04 LAB — GLUCOSE, CAPILLARY
Glucose-Capillary: 92 mg/dL (ref 70–99)
Glucose-Capillary: 119 mg/dL — ABNORMAL HIGH (ref 70–99)
Glucose-Capillary: 130 mg/dL — ABNORMAL HIGH (ref 70–99)
Glucose-Capillary: 137 mg/dL — ABNORMAL HIGH (ref 70–99)

## 2022-07-04 MED ORDER — METHOCARBAMOL 750 MG PO TABS: 750.0000 mg | ORAL_TABLET | Freq: Four times a day (QID) | ORAL | 0 refills | Status: DC | PRN

## 2022-07-04 MED ORDER — AMOXICILLIN-POT CLAVULANATE 875-125 MG PO TABS: 1.0000 | ORAL_TABLET | Freq: Two times a day (BID) | ORAL | 0 refills | Status: AC

## 2022-07-04 MED ORDER — AMOXICILLIN-POT CLAVULANATE 875-125 MG PO TABS
1.0000 | ORAL_TABLET | Freq: Two times a day (BID) | ORAL | Status: DC
  Administered 2022-07-04 – 2022-07-05 (×3): 1 via ORAL
  Filled 2022-07-04 (×3): qty 1

## 2022-07-04 NOTE — Progress Notes (Signed)
Physical Therapy Treatment Patient Details Name: Mario [name] MRN: [id number] DOB: [birth year] Today's Date: 07/04/2022   History of Present Illness 72 year old male who presented to the emergency department with complaint of shortness of breath, acute on chronic hypoxia saturating down to 69% at home and admitted on 07/02/22 for Community-acquired pneumonia.  Past medical history significant for non-small cell lung cancer status post radiation treatment, malignant pleural effusion status post left Pleurx catheter removed on 09/01/2021, vasculitis, aortic stenosis, PE on Eliquis, COPD, chronic hypoxic respiratory failure on 3 L of oxygen, hypertension, coronary artery disease, OSA, type 2 diabetes, CKD stage IIIa    PT Comments     Pt admitted with above diagnosis.  Pt currently with functional limitations due to the deficits listed below (see PT Problem List). PT required 2 attempts for intervention today. Pt initially c/o neck and HA with NT providing pt with HP. PT returned 1.5 hrs later. Pt seated EOB. Pt states he is going to d/c home today. Pt sates he will return to living situation with roommates and no assist. Pt c/o of L eye pain and discomfort. Pt is mod I with bed mobility, S for transfers, min guard with use of SPC for 120 feet with 2 episodes of LOB and minimal initiation of self recovery strategies requiring physical assist from PT for fall prevention. Pt indicates he does not use AD in home setting, PT strongly encouraged pt to use SPC or RW with pt reporting having both for safety, stability and energy conservation. Pt desaturated to 82% on 3 L/min with gait training tasks, assessed once in sitting and pt guided through pursed lip breathing with pt requiring 1:09 to recover to 90% with supplemental O2. Pt states he is frequently SOB. Pt left seated EOB, nursing staff present, all needs in place and on 3 L/min. Pt will benefit from acute skilled PT in current and next venue to increase their  independence and safety with all functional mobility tasks.     Recommendations for follow up therapy are one component of a multi-disciplinary discharge planning process, led by the attending physician.  Recommendations may be updated based on patient status, additional functional criteria and insurance authorization.  Follow Up Recommendations       Assistance Recommended at Discharge PRN  Patient can return home with the following Assistance with cooking/housework;Assist for transportation   Equipment Recommendations  None recommended by PT    Recommendations for Other Services       Precautions / Restrictions Precautions Precautions: Fall Precaution Comments: 3L O2 baseline     Mobility  Bed Mobility               General bed mobility comments: pt sitting EOB on arrival    Transfers Overall transfer level: Needs assistance Equipment used: Straight cane Transfers: Sit to/from Stand Sit to Stand: Supervision                Ambulation/Gait Ambulation/Gait assistance: Min guard, Supervision Gait Distance (Feet): 120 Feet Assistive device: Straight cane Gait Pattern/deviations: Decreased stride length Gait velocity: decreased     General Gait Details: LOB x 2 requring mod A to recover for first LOB and min A to recover from second LOB, LOB associated with head turns to the R. pt exhibits significant R toe out. pt indicated SOB with gait tasks on 3 L/min and pt desaturated to 82% with supplemental O2 assessed once seated pt required 1:09 to recover to 90% with cues for pursed lip  breathing. pt states just about everything makes him SOB. pt required use of SPC however indicated in home setting he is always carrying things and does not use AD, PT encouraged pt to use SPC in home setting for safety, stability and energy conservation pt demonstrated verbal understanding   Stairs             Wheelchair Mobility    Modified Rankin (Stroke Patients Only)        Balance Overall balance assessment: Needs assistance Sitting-balance support: Feet supported Sitting balance-Leahy Scale: Good Sitting balance - Comments: pt able to reach outside BOS and to floor to don B shoes   Standing balance support: No upper extremity supported Standing balance-Leahy Scale: Fair                              Cognition Arousal/Alertness: Awake/alert Behavior During Therapy: WFL for tasks assessed/performed, Flat affect Overall Cognitive Status: Within Functional Limits for tasks assessed                                          Exercises      General Comments        Pertinent Vitals/Pain Pain Assessment Pain Assessment: Faces Faces Pain Scale: Hurts little more Pain Location: back - repports chronic, neck, HA and L eye Pain Intervention(s): Monitored during session, Limited activity within patient's tolerance    Home Living Family/patient expects to be discharged to:: Private residence Living Arrangements: Non-relatives/Friends Available Help at Discharge: Friend(s) Type of Home: House Home Access: Level entry     Alternate Level Stairs-Number of Steps: 5 Home Layout: Two level Home Equipment: Cane - single point;Shower Counsellor (2 wheels);Rollator (4 wheels) Additional Comments: pt reports he rents a room in a home with a few other roommates; goes up/down 5 steps to kitchen/living room area. bedroom on 2nd level of 5 steps. wears 3L O2 at baseline    Prior Function            PT Goals (current goals can now be found in the care plan section) Acute Rehab PT Goals PT Goal Formulation: With patient Time For Goal Achievement: 07/17/22 Potential to Achieve Goals: Good    Frequency    Min 1X/week      PT Plan      Co-evaluation              AM-PAC PT "6 Clicks" Mobility   Outcome Measure  Help needed turning from your back to your side while in a flat bed without using  bedrails?: None Help needed moving from lying on your back to sitting on the side of a flat bed without using bedrails?: None Help needed moving to and from a bed to a chair (including a wheelchair)?: None Help needed standing up from a chair using your arms (e.g., wheelchair or bedside chair)?: None Help needed to walk in hospital room?: A Little Help needed climbing 3-5 steps with a railing? : A Little 6 Click Score: 22    End of Session Equipment Utilized During Treatment: Oxygen Activity Tolerance: Patient tolerated treatment well;Treatment limited secondary to medical complications (Comment) (SOB and desaturation with exertion) Patient left: in bed;with call bell/phone within reach;with nursing/sitter in room Nurse Communication: Mobility status PT Visit Diagnosis: Difficulty in walking, not elsewhere classified (R26.2)  Time: 1610-9604 PT Time Calculation (min) (ACUTE ONLY): 25 min  Charges:  $Gait Training: 8-22 mins $Therapeutic Activity: 8-22 mins                     Rica Mote, PT    Jacqualyn Posey 07/04/2022, 11:57 AM

## 2022-07-04 NOTE — Progress Notes (Signed)
07/04/2022  Mario [name] DOB: November 04, 1950 MRN: [id number]   RIDER WAIVER AND RELEASE OF LIABILITY  For the purposes of helping with transportation needs, [facility] partners with outside transportation providers (taxi companies, Ewa Gentry, Catering manager.) to give [employer] patients or other approved people the choice of on-demand rides [employer]") to our buildings for non-emergency visits.  By using [employer], I, the person signing this document, on behalf of myself and/or any legal minors (in my care using the [employer]), agree:  Science writer given to me are supplied by independent, outside transportation providers who do not work for, or have any affiliation with, [employer]. [facility] is not a transportation company. [facility] has no control over the quality or safety of the rides I get using [employer]. [facility] has no control over whether any outside ride will happen on time or not. [facility] gives no guarantee on the reliability, quality, safety, or availability on any rides, or that no mistakes will happen. I know and accept that traveling by vehicle (car, truck, SVU, Zenaida Niece, bus, taxi, etc.) has risks of serious injuries such as disability, being paralyzed, and death. I know and agree the risk of using [employer] is mine alone, and not [employer]. Transport Services are provided "as is" and as are available. The transportation providers are in charge for all inspections and care of the vehicles used to provide these rides. I agree not to take legal action against [facility], its agents, employees, officers, directors, representatives, insurers, attorneys, assigns, successors, subsidiaries, and affiliates at any time for any reasons related directly or indirectly to using [employer]. I also agree not to take legal action against  or its affiliates for any injury, death, or damage to property caused by or related to using  [employer]. I have read this Waiver and Release of Liability, and I understand the terms used in it and their legal meaning. This Waiver is freely and voluntarily given with the understanding that my right (or any legal minors) to legal action against  relating to [employer] is knowingly given up to use these services.   I attest that I read the Ride Waiver and Release of Liability to Mario [name], gave [name] the opportunity to ask questions and answered the questions asked (if any). I affirm that Mario [name] then provided consent for assistance with transportation.

## 2022-07-04 NOTE — TOC Progression Note (Addendum)
Transition of Care [facility]) - Progression Note    Patient Details  Name: Mario [name] MRN: [id number] Date of Birth: 06-Dec-1950  Transition of Care [facility]) CM/SW Contact  Erin Sons, [place] Phone Number: 07/04/2022, 10:21 AM  Clinical Narrative:     Info added to AVS for pt to follow up with PCP at [facility] clinic to arrange outpatient PT. Taxi voucher given to RN for transport home.  1230: PT updated rec to homehealth. Spoke with pt who is agreeable to HHPT and has a preference for [facility]. HHPT arranged with [facility].   Pt's portable O2 tank also not working. CS[facility] with [facility]. She will send tank. There is a 4 hour window for deliver. Once tank arrives, pt can DC.   1655: [facility] still not delivered [facility]; left voicemail request return call regarding delivery.        Expected Discharge Plan and Services         Expected Discharge Date: 07/04/22                                     Social Determinants of Health (SDOH) Interventions SDOH Screenings   Food Insecurity: No Food Insecurity (07/03/2022)  Housing: Low Risk  (07/03/2022)  Transportation Needs: No Transportation Needs (07/03/2022)  Utilities: Not At Risk (07/03/2022)  Financial Resource Strain: Low Risk  (09/23/2020)  Stress: No Stress Concern Present (09/23/2020)  Tobacco Use: Medium Risk (07/02/2022)    Readmission Risk Interventions    11/03/2021   11:50 AM 08/23/2021   11:48 AM 08/05/2021   11:02 AM  Readmission Risk Prevention Plan  Transportation Screening Complete Complete Complete  Medication Review Oceanographer) Complete Complete Complete  PCP or Specialist appointment within 3-5 days of discharge Complete Complete Complete  HRI or Home Care Consult Complete Complete Complete  SW Recovery Care/Counseling Consult Complete Complete Complete  Palliative Care Screening Not Applicable Not Applicable Not Applicable  Skilled Nursing Facility Not  Applicable Not Applicable Not Applicable

## 2022-07-04 NOTE — Discharge Summary (Signed)
Physician Discharge Summary  ASEEM MERISIER ZOX:[id number] DOB: 1950-07-23 DOA: 07/02/2022  PCP: [name]  Admit date: 07/02/2022 Discharge date: 07/04/2022  Admitted From: Home Disposition:  Home  Discharge Condition:Stable CODE STATUS:FULL Diet recommendation: Heart Healthy  Brief/Interim Summary: Patient is a 72 year old male with history of  non-small cell lung cancer status post radiation treatment, malignant pleural effusion status post left Pleurx catheter removed on 09/01/2021, vasculitis, aortic stenosis, PE on Eliquis, COPD, chronic hypoxic respiratory failure on 3 L of oxygen, hypertension, coronary artery disease, OSA, type 2 diabetes, CKD stage IIIa who presented to the emergency department with complaint of shortness of breath, acute on chronic hypoxia saturating down to 69% at home.  Patient was dyspneic on minimal exertion.  On presentation, he was hemodynamically stable, saturating 92% on 3 L of oxygen.  CTA chest did not show any PE but showed progressive bilateral lower lobe consolidation consistent with worsening pneumonia and/or atelectasis, stable loculated left pleural effusion.  CT abdomen/pelvis showed fecal retention throughout the colon consistent with constipation.  Patient was started on broad-spectrum antibiotics for management of community-acquired pneumonia.  His hospital course remained stable.  Respiratory status remained stable and he is currently on baseline oxygen requirement.  Patient feels much better.  Medically stable for discharge to home with oral antibiotics.  Following problems were addressed during the hospitalization:  Community-acquired pneumonia: Presented with dyspnea on minimal exertion.  Found to have bilateral lower lobe consolidation though atelectasis cudnt be ruled out.  Started on ceftriaxone, azithromycin.  No fever, cough or leukocytosis.  Lactic acid level normal.  Procalcitonin nonreassuring.  COVID/flu/RSV negative. Patient  remains comfortable.  Antibiotics changed to oral.   Acute on chronic hypoxic respiratory failure: Uses 3 L of oxygen at home.  Currently on the same.   History of stage IV lung cancer: Previously following with Dr. [name], had second opinion at [facility].  Patient states that his cancer is in remission.  He was treated only with radiation therapy and was not a candidate for chemotherapy due to his multiple medical problems.  He has an appointment with the [facility] oncology soon in June   History of PE: Was taking Eliquis earlier, not taking currently.  CT did not show any evidence of PE.  Counseled to restart taking Eliquis and discuss  with his PCP if needs discontinuation.  He has high risk for recurrence of PE in the future because of his history of malignancy   History of hypertension: On lisinopril, Cardizem   History of vasculitis: Taking CellCept   BPH: Continue Flomax   Stage IIIa CKD: Current renal function stable.   History of depression: On Cymbalta   OSA: Uses CPAP at night   Chronic pain syndrome: Continue current medications.  On Cymbalta   COPD: No signs of exacerbation.  Continue bronchodilators as ordered.   Diabetes type 2: Last A1c of 6.3.  Continue home regimen  Debility/deconditioning: PT consulted, outpatient follow-up recommended   Discharge Diagnoses:  Principal Problem:   Community acquired pneumonia Active Problems:   Acute on chronic respiratory failure with hypoxia (HCC)   DM2 (diabetes mellitus, type 2) (HCC)   COPD (chronic obstructive pulmonary disease) (HCC)   Non-small cell cancer of left lung (HCC)   Hypertension   history of Pulmonary embolism (HCC)   Chronic pain   Vasculitis (HCC)   BPH (benign prostatic hyperplasia)   Stage 3a chronic kidney disease (HCC)   Major depressive disorder, recurrent episode (HCC)   OSA (obstructive  sleep apnea)    Discharge Instructions  Discharge Instructions     Diet - low sodium heart healthy   Complete by:  As directed    Discharge instructions   Complete by: As directed    1)Please take prescribed medications as instructed 2)Follow up with your PCP and oncologist as an outpatient as soon as possible   Increase activity slowly   Complete by: As directed       Allergies as of 07/04/2022       Reactions   Demerol [meperidine] Nausea And Vomiting, Other (See Comments)   Made the patient "violently sick"   Zocor [simvastatin] Nausea And Vomiting, Other (See Comments)   Made him very jittery, also   Beet [beta Vulgaris] Nausea And Vomiting   Collard Greens [wild Lettuce Extract (lactuca Virosa)] Other (See Comments)   Throw up   Liver Nausea And Vomiting        Medication List     TAKE these medications    albuterol (2.5 MG/3ML) 0.083% nebulizer solution Commonly known as: PROVENTIL Inhale 3 mLs (2.5 mg total) by nebulization every 4 (four) hours as needed for wheezing or shortness of breath. What changed: when to take this   albuterol 108 (90 Base) MCG/ACT inhaler Commonly known as: VENTOLIN HFA Inhale 2 puffs into the lungs every 4 (four) hours as needed for wheezing or shortness of breath. What changed: Another medication with the same name was changed. Make sure you understand how and when to take each.   amoxicillin-clavulanate 875-125 MG tablet Commonly known as: AUGMENTIN Take 1 tablet by mouth every 12 (twelve) hours for 5 days.   apixaban 5 MG Tabs tablet Commonly known as: ELIQUIS Take 5 mg by mouth 2 (two) times daily.   buPROPion 300 MG 24 hr tablet Commonly known as: WELLBUTRIN XL Take 300 mg by mouth every morning.   busPIRone 15 MG tablet Commonly known as: BUSPAR Take 15 mg by mouth daily.   Carboxymethylcellulose Sod PF 0.5 % Soln Place 1 drop into both eyes 4 (four) times daily as needed (dry eyes).   clobetasol ointment 0.05 % Commonly known as: TEMOVATE Apply 1 Application topically 2 (two) times daily as needed (scars on feet from  vasculitis).   cyclobenzaprine 10 MG tablet Commonly known as: FLEXERIL Take 30 mg by mouth at bedtime.   cycloSPORINE 0.05 % ophthalmic emulsion Commonly known as: RESTASIS Place 1 drop into both eyes every 12 (twelve) hours.   diltiazem 180 MG 24 hr capsule Commonly known as: CARDIZEM CD Take 180 mg by mouth daily.   docusate sodium 100 MG capsule Commonly known as: COLACE Take 100 mg by mouth 2 (two) times daily.   DULoxetine 60 MG capsule Commonly known as: CYMBALTA Take 1 capsule (60 mg total) by mouth 2 (two) times daily.   Ensure Original Liqd Take 1 Bottle by mouth daily as needed (in between meals).   fluticasone 50 MCG/ACT nasal spray Commonly known as: FLONASE Place 2 sprays into both nostrils daily.   folic acid 1 MG tablet Commonly known as: FOLVITE Take 1 mg by mouth daily.   gabapentin 300 MG capsule Commonly known as: NEURONTIN Take 3 capsules (900 mg total) by mouth at bedtime. What changed:  how much to take when to take this additional instructions   guaiFENesin 600 MG 12 hr tablet Commonly known as: MUCINEX Take 600 mg by mouth 2 (two) times daily as needed for cough.   lisinopril 20 MG tablet Commonly  known as: ZESTRIL Take 20 mg by mouth daily.   lurasidone 40 MG Tabs tablet Commonly known as: LATUDA Take 40 mg by mouth every evening. After supper   Melatonin 3 MG Caps Take 6 mg by mouth at bedtime as needed (sleep).   Metamucil Smooth Texture 58.6 % powder Generic drug: psyllium Take 1 packet by mouth 3 (three) times daily.   metFORMIN 850 MG tablet Commonly known as: GLUCOPHAGE Take 850 mg by mouth daily with breakfast.   methocarbamol 750 MG tablet Commonly known as: ROBAXIN Take 1 tablet (750 mg total) by mouth every 6 (six) hours as needed for muscle spasms.   methotrexate 2.5 MG tablet Commonly known as: RHEUMATREX Take 15 mg by mouth once a week. Caution:Chemotherapy. Protect from light. Tuesdays   Mometasone Furoate  200 MCG/ACT Aero Inhale 2 puffs into the lungs 2 (two) times daily.   mycophenolate 250 MG capsule Commonly known as: CELLCEPT Take 1,500 mg by mouth 2 (two) times daily.   ondansetron 4 MG disintegrating tablet Commonly known as: ZOFRAN-ODT Take 1 tablet (4 mg total) by mouth every 8 (eight) hours as needed for nausea or vomiting.   oxcarbazepine 600 MG tablet Commonly known as: TRILEPTAL Take 600 mg by mouth See admin instructions. Take 1 tablet BID and at bedtime   oxyCODONE 5 MG immediate release tablet Commonly known as: Oxy IR/ROXICODONE Take 1-2 tablets (5-10 mg total) by mouth every 4 (four) hours as needed for severe pain. What changed: how much to take   pantoprazole 40 MG tablet Commonly known as: PROTONIX Take 40 mg by mouth daily. Take on an empty stomach   polyethylene glycol powder 17 GM/SCOOP powder Commonly known as: GLYCOLAX/MIRALAX Take 17 g by mouth daily.   prednisoLONE acetate 1 % ophthalmic suspension Commonly known as: PRED FORTE Place 1 drop into both eyes See admin instructions. 1 drop every hour while awake   senna-docusate 8.6-50 MG tablet Commonly known as: Senokot-S Take 1 tablet by mouth daily.   tamsulosin 0.4 MG Caps capsule Commonly known as: FLOMAX Take 0.8 mg by mouth daily.   Tiotropium Bromide-Olodaterol 2.5-2.5 MCG/ACT Aers Inhale 2 each into the lungs every morning. 2 puffs   traZODone 50 MG tablet Commonly known as: DESYREL Take 75 mg by mouth at bedtime.        Follow-up Information     [facility] Schedule an appointment as soon as possible for a visit in 1 week(s).   Contact information: [address] [facility] [place] [address] [phone]                Allergies  Allergen Reactions   Demerol [Meperidine] Nausea And Vomiting and Other (See Comments)    Made the patient "violently sick"   Zocor [Simvastatin] Nausea And Vomiting and Other (See Comments)    Made him very  jittery, also   Beet [Beta Vulgaris] Nausea And Vomiting   Collard Greens [Wild Lettuce Extract (Lactuca Virosa)] Other (See Comments)    Throw up   Liver Nausea And Vomiting    Consultations:    Procedures/Studies: CT Angio Chest PE W and/or Wo Contrast  Result Date: 07/02/2022 CLINICAL DATA:  Acute abdominal pain, chest pain with inspiration, lower abdominal pain, shortness of breath EXAM: CT ANGIOGRAPHY CHEST CT ABDOMEN AND PELVIS WITH CONTRAST TECHNIQUE: Multidetector CT imaging of the chest was performed using the standard protocol during bolus administration of intravenous contrast. Multiplanar CT image reconstructions and MIPs were obtained to evaluate the  vascular anatomy. Multidetector CT imaging of the abdomen and pelvis was performed using the standard protocol during bolus administration of intravenous contrast. RADIATION DOSE REDUCTION: This exam was performed according to the departmental dose-optimization program which includes automated exposure control, adjustment of the mA and/or kV according to patient size and/or use of iterative reconstruction technique. CONTRAST:  OMNIPAQUE IOHEXOL 350 MG/ML SOLN COMPARISON:  06/29/2022 FINDINGS: CTA CHEST FINDINGS Cardiovascular: This is a technically adequate evaluation of the pulmonary vasculature. No filling defects or pulmonary emboli. The heart is unremarkable without pericardial effusion. Diffuse atherosclerosis throughout the coronary vasculature. No evidence of thoracic aortic aneurysm or dissection. Atherosclerosis of the descending thoracic aorta. Mediastinum/Nodes: No enlarged mediastinal, hilar, or axillary lymph nodes. Thyroid gland, trachea, and esophagus demonstrate no significant findings. Lungs/Pleura: There is progressive bilateral lower lobe consolidation since the prior exam, consistent with worsening pneumonia and/or atelectasis. Small loculated left pleural effusion is unchanged. No pneumothorax. Stable upper lobe  predominant emphysema. Central airways are patent. Musculoskeletal: Stable left lateral seventh and eighth rib fractures. No other acute bony abnormalities. Reconstructed images demonstrate no additional findings. Review of the MIP images confirms the above findings. CT ABDOMEN and PELVIS FINDINGS Hepatobiliary: No focal liver abnormality is seen. No gallstones, gallbladder wall thickening, or biliary dilatation. Pancreas: Unremarkable. No pancreatic ductal dilatation or surrounding inflammatory changes. Spleen: Normal in size without focal abnormality. Adrenals/Urinary Tract: Stable appearance of the bilateral kidneys. No urinary tract calculi or obstructive uropathy. The adrenals and bladder are unremarkable. Stomach/Bowel: No bowel obstruction or ileus. Sigmoid diverticulosis is again noted. There has been near complete resolution of the wall thickening and pericolonic fat stranding within the mid sigmoid colon seen previously, consistent with improving diverticulitis. No perforation, fluid collection, or abscess. There is moderate retained stool throughout the colon consistent with constipation. Vascular/Lymphatic: Aortic atherosclerosis. No enlarged abdominal or pelvic lymph nodes. Reproductive: Stable enlargement of the prostate. Other: No free fluid or free intraperitoneal gas. Small fat containing left inguinal hernia. Musculoskeletal: No acute or destructive bony lesions. Reconstructed images demonstrate no additional findings. Review of the MIP images confirms the above findings. IMPRESSION: Chest: 1. No evidence of pulmonary embolus. 2. Progressive bilateral lower lobe consolidation, consistent with worsening pneumonia and/or atelectasis. 3. Stable small loculated left pleural effusion. 4. Aortic Atherosclerosis (ICD10-I70.0) and Emphysema (ICD10-J43.9). 5. Stable left lateral seventh and eighth rib fractures. Abdomen/pelvis: 1. Significant improvement in the acute uncomplicated sigmoid diverticulosis  seen previously. Marked improvement in wall thickening and pericolonic fat stranding. 2. Marked fecal retention throughout the colon consistent with constipation. No bowel obstruction or ileus. 3. Stable enlarged prostate. 4.  Aortic Atherosclerosis (ICD10-I70.0). Electronically Signed   By: Sharlet Salina M.D.   On: 07/02/2022 17:41   CT ABDOMEN PELVIS W CONTRAST  Result Date: 07/02/2022 CLINICAL DATA:  Acute abdominal pain, chest pain with inspiration, lower abdominal pain, shortness of breath EXAM: CT ANGIOGRAPHY CHEST CT ABDOMEN AND PELVIS WITH CONTRAST TECHNIQUE: Multidetector CT imaging of the chest was performed using the standard protocol during bolus administration of intravenous contrast. Multiplanar CT image reconstructions and MIPs were obtained to evaluate the vascular anatomy. Multidetector CT imaging of the abdomen and pelvis was performed using the standard protocol during bolus administration of intravenous contrast. RADIATION DOSE REDUCTION: This exam was performed according to the departmental dose-optimization program which includes automated exposure control, adjustment of the mA and/or kV according to patient size and/or use of iterative reconstruction technique. CONTRAST:  OMNIPAQUE IOHEXOL 350 MG/ML SOLN COMPARISON:  06/29/2022 FINDINGS:  CTA CHEST FINDINGS Cardiovascular: This is a technically adequate evaluation of the pulmonary vasculature. No filling defects or pulmonary emboli. The heart is unremarkable without pericardial effusion. Diffuse atherosclerosis throughout the coronary vasculature. No evidence of thoracic aortic aneurysm or dissection. Atherosclerosis of the descending thoracic aorta. Mediastinum/Nodes: No enlarged mediastinal, hilar, or axillary lymph nodes. Thyroid gland, trachea, and esophagus demonstrate no significant findings. Lungs/Pleura: There is progressive bilateral lower lobe consolidation since the prior exam, consistent with worsening pneumonia and/or  atelectasis. Small loculated left pleural effusion is unchanged. No pneumothorax. Stable upper lobe predominant emphysema. Central airways are patent. Musculoskeletal: Stable left lateral seventh and eighth rib fractures. No other acute bony abnormalities. Reconstructed images demonstrate no additional findings. Review of the MIP images confirms the above findings. CT ABDOMEN and PELVIS FINDINGS Hepatobiliary: No focal liver abnormality is seen. No gallstones, gallbladder wall thickening, or biliary dilatation. Pancreas: Unremarkable. No pancreatic ductal dilatation or surrounding inflammatory changes. Spleen: Normal in size without focal abnormality. Adrenals/Urinary Tract: Stable appearance of the bilateral kidneys. No urinary tract calculi or obstructive uropathy. The adrenals and bladder are unremarkable. Stomach/Bowel: No bowel obstruction or ileus. Sigmoid diverticulosis is again noted. There has been near complete resolution of the wall thickening and pericolonic fat stranding within the mid sigmoid colon seen previously, consistent with improving diverticulitis. No perforation, fluid collection, or abscess. There is moderate retained stool throughout the colon consistent with constipation. Vascular/Lymphatic: Aortic atherosclerosis. No enlarged abdominal or pelvic lymph nodes. Reproductive: Stable enlargement of the prostate. Other: No free fluid or free intraperitoneal gas. Small fat containing left inguinal hernia. Musculoskeletal: No acute or destructive bony lesions. Reconstructed images demonstrate no additional findings. Review of the MIP images confirms the above findings. IMPRESSION: Chest: 1. No evidence of pulmonary embolus. 2. Progressive bilateral lower lobe consolidation, consistent with worsening pneumonia and/or atelectasis. 3. Stable small loculated left pleural effusion. 4. Aortic Atherosclerosis (ICD10-I70.0) and Emphysema (ICD10-J43.9). 5. Stable left lateral seventh and eighth rib  fractures. Abdomen/pelvis: 1. Significant improvement in the acute uncomplicated sigmoid diverticulosis seen previously. Marked improvement in wall thickening and pericolonic fat stranding. 2. Marked fecal retention throughout the colon consistent with constipation. No bowel obstruction or ileus. 3. Stable enlarged prostate. 4.  Aortic Atherosclerosis (ICD10-I70.0). Electronically Signed   By: Sharlet Salina M.D.   On: 07/02/2022 17:41   CT ABDOMEN PELVIS W CONTRAST  Result Date: 06/29/2022 CLINICAL DATA:  Constipation and abdominal pain. Bowel obstruction suspected. EXAM: CT ABDOMEN AND PELVIS WITH CONTRAST TECHNIQUE: Multidetector CT imaging of the abdomen and pelvis was performed using the standard protocol following bolus administration of intravenous contrast. RADIATION DOSE REDUCTION: This exam was performed according to the departmental dose-optimization program which includes automated exposure control, adjustment of the mA and/or kV according to patient size and/or use of iterative reconstruction technique. CONTRAST:  OMNIPAQUE IOHEXOL 300 MG/ML  SOLN COMPARISON:  CTA chest 11/02/2021, CT chest, abdomen and pelvis with contrast 09/29/2021 FINDINGS: Lower chest: Lung bases are emphysematous with asymmetric bullous disease in the right lower lobe. There is small loculated left pleural effusion which is smaller than on 11/02/2021, but there is increased airspace consolidation in the left-greater-than-right lower lobes concerning for pneumonia or aspiration. There are coarse scarring changes in the lingula and right middle lobe bases. The cardiac size is normal and there is patchy three-vessel coronary artery calcification again shown. There is mild chronic elevation of the posterior right hemidiaphragm. Hepatobiliary: The gallbladder and bile ducts are unremarkable. The liver enhances homogeneously except for a  chronic 1.7 cm cyst of 7 Hounsfield units in the caudate hepatic lobe. Pancreas: No  abnormality. Spleen: No abnormality.  No splenomegaly. Adrenals/Urinary Tract: There is no adrenal mass. There is a 9 mm simple cyst in the superior pole of the right kidney. There is a 2.6 cm simple cyst laterally in the mid right kidney, Hounsfield density is 16.7. No follow-up imaging is recommended. There is a simple cyst in the posterior left kidney of 1.8 cm, Hounsfield density of 16. No follow-up imaging is recommended. There is no renal mass enhancement, stones or hydronephrosis and no bladder thickening. Stomach/Bowel: Unremarkable gastric wall and unopacified small bowel. Again noted is a high-riding mobile cecum with the ileocecal junction lateral foot lower. An appendix is not seen in this patient. There is moderate to severe retained stool. Advanced sigmoid diverticulosis. There are faint inflammatory stranding changes alongside the mid third of the sigmoid colon consistent with a mild acute diverticulitis. No free air or diverticular abscess are seen. The rectal wall and remainder of the colon wall are unremarkable. Vascular/Lymphatic: Aortic atherosclerosis. No enlarged abdominal or pelvic lymph nodes. Reproductive: Prostatomegaly.  Prostate transverse axis 5.7 cm. Other: Small umbilical and left inguinal fat hernias. Small supraumbilical midline hernia. A small bowel loop extends up to but not through this defect. There is no incarcerated hernia. There is no free air, free hemorrhage or free fluid. There is abdominal wall laxity with bilateral bulging laterally. Musculoskeletal: Chronic displaced fractures again noted of the left lateral eighth and ninth ribs. Ankylosis right SI joint. Vertebral body and posterior element ankylosis L4-5. Degenerative changes of the spine. No acute or other significant osseous findings. Bridging syndesmophytes thoracic spine. IMPRESSION: 1. Advanced sigmoid diverticulosis with faint inflammatory stranding changes alongside the mid third of the sigmoid colon consistent  with a mild acute diverticulitis. No free air or diverticular abscess. 2. Constipation and diverticulosis, with moderate to severe fecal stasis. 3. No small bowel obstruction or inflammation. High riding mobile cecum. 4. Small loculated left pleural effusion, smaller than on 11/02/2021. 5. Increased airspace consolidation in the left-greater-than-right lower lobes concerning for pneumonia or aspiration. 6. Emphysema. 7. Aortic and coronary artery atherosclerosis. 8. Prostatomegaly. 9. Small umbilical and left inguinal fat hernias. 10. Chronic displaced fractures of the left lateral eighth and ninth ribs. Aortic Atherosclerosis (ICD10-I70.0) and Emphysema (ICD10-J43.9). Electronically Signed   By: Almira Bar M.D.   On: 06/29/2022 22:31   DG Chest 1 View  Result Date: 06/29/2022 CLINICAL DATA:  History of lung cancer. Increasing shortness of breath. EXAM: CHEST  1 VIEW COMPARISON:  Radiographs and CT 11/02/2021 FINDINGS: Stable cardiomediastinal silhouette. Bibasilar airspace and interstitial opacities. Small bilateral pleural effusions. No pneumothorax. No acute osseous abnormality. IMPRESSION: 1. Bibasilar airspace and interstitial opacities favored to represent superimposed infection or edema on a background of emphysema. 2. Small bilateral pleural effusions. Electronically Signed   By: Minerva Fester M.D.   On: 06/29/2022 20:30      Subjective: Patient seen and examined at bedside today.  Hemodynamically stable.  Complains of some pain on the left neck and says that the pillow  was uncomfortable.  Respiratory status stable.  On 23L of oxygen per minute.  Denies any worsening shortness of breath or cough.  Medically stable for discharge home  Discharge Exam: Vitals:   07/04/22 0347 07/04/22 0742  BP: (!) 140/81   Pulse: 84   Resp: 20   Temp: (!) 97.5 F (36.4 C)   SpO2: 91% 93%   Vitals:  07/03/22 2040 07/03/22 2058 07/04/22 0347 07/04/22 0742  BP: (!) 151/85  (!) 140/81   Pulse: 88  84    Resp: 20  20   Temp: 97.9 F (36.6 C)  (!) 97.5 F (36.4 C)   TempSrc:      SpO2: 93% 92% 91% 93%  Weight:      Height:        General: Pt is alert, awake, not in acute distress, morbidly obese Cardiovascular: RRR, S1/S2 +, no rubs, no gallops Respiratory: CTA bilaterally, no wheezing, no rhonchi Abdominal: Soft, NT, ND, bowel sounds + Extremities: no edema, no cyanosis    The results of significant diagnostics from this hospitalization (including imaging, microbiology, ancillary and laboratory) are listed below for reference.     Microbiology: Recent Results (from the past 240 hour(s))  Resp panel by RT-PCR (RSV, Flu A&B, Covid) Anterior Nasal Swab     Status: None   Collection Time: 07/02/22  6:04 PM   Specimen: Anterior Nasal Swab  Result Value Ref Range Status   SARS Coronavirus 2 by RT PCR NEGATIVE NEGATIVE Final    Comment: (NOTE) SARS-CoV-2 target nucleic acids are NOT DETECTED.  The SARS-CoV-2 RNA is generally detectable in upper respiratory specimens during the acute phase of infection. The lowest concentration of SARS-CoV-2 viral copies this assay can detect is 138 copies/mL. A negative result does not preclude SARS-Cov-2 infection and should not be used as the sole basis for treatment or other patient management decisions. A negative result may occur with  improper specimen collection/handling, submission of specimen other than nasopharyngeal swab, presence of viral mutation(s) within the areas targeted by this assay, and inadequate number of viral copies(<138 copies/mL). A negative result must be combined with clinical observations, patient history, and epidemiological information. The expected result is Negative.  Fact Sheet for Patients:  BloggerCourse.com  Fact Sheet for Healthcare Providers:  SeriousBroker.it  This test is no t yet approved or cleared by the [place] FDA and  has been  authorized for detection and/or diagnosis of SARS-CoV-2 by FDA under an Emergency Use Authorization (EUA). This EUA will remain  in effect (meaning this test can be used) for the duration of the COVID-19 declaration under Section 564(b)(1) of the Act, 21 U.S.C.section 360bbb-3(b)(1), unless the authorization is terminated  or revoked sooner.       Influenza A by PCR NEGATIVE NEGATIVE Final   Influenza B by PCR NEGATIVE NEGATIVE Final    Comment: (NOTE) The Xpert Xpress SARS-CoV-2/FLU/RSV plus assay is intended as an aid in the diagnosis of influenza from Nasopharyngeal swab specimens and should not be used as a sole basis for treatment. Nasal washings and aspirates are unacceptable for Xpert Xpress SARS-CoV-2/FLU/RSV testing.  Fact Sheet for Patients: BloggerCourse.com  Fact Sheet for Healthcare Providers: SeriousBroker.it  This test is not yet approved or cleared by the [place] FDA and has been authorized for detection and/or diagnosis of SARS-CoV-2 by FDA under an Emergency Use Authorization (EUA). This EUA will remain in effect (meaning this test can be used) for the duration of the COVID-19 declaration under Section 564(b)(1) of the Act, 21 U.S.C. section 360bbb-3(b)(1), unless the authorization is terminated or revoked.     Resp Syncytial Virus by PCR NEGATIVE NEGATIVE Final    Comment: (NOTE) Fact Sheet for Patients: BloggerCourse.com  Fact Sheet for Healthcare Providers: SeriousBroker.it  This test is not yet approved or cleared by the [place] FDA and has been authorized for detection and/or diagnosis  of SARS-CoV-2 by FDA under an Emergency Use Authorization (EUA). This EUA will remain in effect (meaning this test can be used) for the duration of the COVID-19 declaration under Section 564(b)(1) of the Act, 21 U.S.C. section 360bbb-3(b)(1), unless the  authorization is terminated or revoked.  Performed at [facility], [address], [place], [place] [address]      Labs: BNP (last 3 results) Recent Labs    10/11/21 0114 11/02/21 2005 07/02/22 1635  BNP 31.0 21.8 20.1   Basic Metabolic Panel: Recent Labs  Lab 06/29/22 2010 07/02/22 1635 07/03/22 0632  NA 134* 135 137  K 4.8 4.0 4.1  CL 100 101 102  CO2 24 23 25   GLUCOSE 89 161* 112*  BUN 14 14 13   CREATININE 0.87 1.03 0.98  CALCIUM 9.2 8.4* 8.4*   Liver Function Tests: Recent Labs  Lab 06/29/22 2010 07/02/22 1635  AST 28 20  ALT 11 17  ALKPHOS 93 91  BILITOT 0.9 0.5  PROT 7.6 6.9  ALBUMIN 3.7 3.4*   No results for input(s): "LIPASE", "AMYLASE" in the last 168 hours. No results for input(s): "AMMONIA" in the last 168 hours. CBC: Recent Labs  Lab 06/29/22 2010 07/02/22 1635 07/03/22 0632  WBC 10.3 8.2 7.7  NEUTROABS 7.1 5.6  --   HGB 13.2 12.3* 12.5*  HCT 41.3 38.0* 38.7*  MCV 89.6 90.9 90.6  PLT 341 331 327   Cardiac Enzymes: No results for input(s): "CKTOTAL", "CKMB", "CKMBINDEX", "TROPONINI" in the last 168 hours. BNP: Invalid input(s): "POCBNP" CBG: Recent Labs  Lab 07/03/22 0820 07/03/22 1148 07/03/22 1625 07/03/22 2041 07/04/22 0727  GLUCAP 151* 112* 86 91 92   D-Dimer No results for input(s): "DDIMER" in the last 72 hours. Hgb A1c No results for input(s): "HGBA1C" in the last 72 hours. Lipid Profile No results for input(s): "CHOL", "HDL", "LDLCALC", "TRIG", "CHOLHDL", "LDLDIRECT" in the last 72 hours. Thyroid function studies Recent Labs    07/02/22 2043  TSH 2.195   Anemia work up Recent Labs    07/02/22 2043  VITAMINB12 444   Urinalysis    Component Value Date/Time   COLORURINE YELLOW 07/02/2022 1943   APPEARANCEUR CLEAR 07/02/2022 1943   LABSPEC 1.017 07/02/2022 1943   PHURINE 6.0 07/02/2022 1943   GLUCOSEU NEGATIVE 07/02/2022 1943   HGBUR NEGATIVE 07/02/2022 1943   BILIRUBINUR NEGATIVE  07/02/2022 1943   KETONESUR NEGATIVE 07/02/2022 1943   PROTEINUR NEGATIVE 07/02/2022 1943   UROBILINOGEN 1.0 01/12/2013 0024   NITRITE NEGATIVE 07/02/2022 1943   LEUKOCYTESUR NEGATIVE 07/02/2022 1943   Sepsis Labs Recent Labs  Lab 06/29/22 2010 07/02/22 1635 07/03/22 0632  WBC 10.3 8.2 7.7   Microbiology Recent Results (from the past 240 hour(s))  Resp panel by RT-PCR (RSV, Flu A&B, Covid) Anterior Nasal Swab     Status: None   Collection Time: 07/02/22  6:04 PM   Specimen: Anterior Nasal Swab  Result Value Ref Range Status   SARS Coronavirus 2 by RT PCR NEGATIVE NEGATIVE Final    Comment: (NOTE) SARS-CoV-2 target nucleic acids are NOT DETECTED.  The SARS-CoV-2 RNA is generally detectable in upper respiratory specimens during the acute phase of infection. The lowest concentration of SARS-CoV-2 viral copies this assay can detect is 138 copies/mL. A negative result does not preclude SARS-Cov-2 infection and should not be used as the sole basis for treatment or other patient management decisions. A negative result may occur with  improper specimen collection/handling, submission of specimen other than  nasopharyngeal swab, presence of viral mutation(s) within the areas targeted by this assay, and inadequate number of viral copies(<138 copies/mL). A negative result must be combined with clinical observations, patient history, and epidemiological information. The expected result is Negative.  Fact Sheet for Patients:  BloggerCourse.com  Fact Sheet for Healthcare Providers:  SeriousBroker.it  This test is no t yet approved or cleared by the [place] FDA and  has been authorized for detection and/or diagnosis of SARS-CoV-2 by FDA under an Emergency Use Authorization (EUA). This EUA will remain  in effect (meaning this test can be used) for the duration of the COVID-19 declaration under Section 564(b)(1) of the Act,  21 U.S.C.section 360bbb-3(b)(1), unless the authorization is terminated  or revoked sooner.       Influenza A by PCR NEGATIVE NEGATIVE Final   Influenza B by PCR NEGATIVE NEGATIVE Final    Comment: (NOTE) The Xpert Xpress SARS-CoV-2/FLU/RSV plus assay is intended as an aid in the diagnosis of influenza from Nasopharyngeal swab specimens and should not be used as a sole basis for treatment. Nasal washings and aspirates are unacceptable for Xpert Xpress SARS-CoV-2/FLU/RSV testing.  Fact Sheet for Patients: BloggerCourse.com  Fact Sheet for Healthcare Providers: SeriousBroker.it  This test is not yet approved or cleared by the [place] FDA and has been authorized for detection and/or diagnosis of SARS-CoV-2 by FDA under an Emergency Use Authorization (EUA). This EUA will remain in effect (meaning this test can be used) for the duration of the COVID-19 declaration under Section 564(b)(1) of the Act, 21 U.S.C. section 360bbb-3(b)(1), unless the authorization is terminated or revoked.     Resp Syncytial Virus by PCR NEGATIVE NEGATIVE Final    Comment: (NOTE) Fact Sheet for Patients: BloggerCourse.com  Fact Sheet for Healthcare Providers: SeriousBroker.it  This test is not yet approved or cleared by the [place] FDA and has been authorized for detection and/or diagnosis of SARS-CoV-2 by FDA under an Emergency Use Authorization (EUA). This EUA will remain in effect (meaning this test can be used) for the duration of the COVID-19 declaration under Section 564(b)(1) of the Act, 21 U.S.C. section 360bbb-3(b)(1), unless the authorization is terminated or revoked.  Performed at [facility], [address], [place], [place] [address]     Please note: You were cared for by a hospitalist during your hospital stay. Once you are discharged, your primary  care physician will handle any further medical issues. Please note that NO REFILLS for any discharge medications will be authorized once you are discharged, as it is imperative that you return to your primary care physician (or establish a relationship with a primary care physician if you do not have one) for your post hospital discharge needs so that they can reassess your need for medications and monitor your lab values.    Time coordinating discharge: 40 minutes  SIGNED:   Burnadette Pop, MD  [facility] 07/04/2022, 10:17 AM Pager [phone]  If 7PM-7AM, please contact night-coverage [email or web address] Password [email or web address]

## 2022-07-05 LAB — GLUCOSE, CAPILLARY
Glucose-Capillary: 109 mg/dL — ABNORMAL HIGH (ref 70–99)
Glucose-Capillary: 130 mg/dL — ABNORMAL HIGH (ref 70–99)

## 2022-07-05 LAB — LEGIONELLA PNEUMOPHILA SEROGP 1 UR AG: L. pneumophila Serogp 1 Ur Ag: NEGATIVE

## 2022-07-05 NOTE — TOC Progression Note (Addendum)
Transition of Care [facility]) - Progression Note    Patient Details  Name: Mario [name] MRN: [id number] Date of Birth: 03/10/1950  Transition of Care [facility]) CM/SW Contact  Erin Sons, [place] Phone Number: 07/05/2022, 9:01 AM  Clinical Narrative:     O2 never arrived. CSW contacted [place] 02 rep who states they will be in touch with [employer] to see why it wasn't delivered.   1030: Received voicemail from [place] [facility] clinic. CSW is informed, per [place] [facility] clinic, that [facility] reported an oversight on their part and that they will deliver 02 today.          Expected Discharge Plan and Services         Expected Discharge Date: 07/04/22                                     Social Determinants of Health (SDOH) Interventions SDOH Screenings   Food Insecurity: No Food Insecurity (07/03/2022)  Housing: Low Risk  (07/03/2022)  Transportation Needs: No Transportation Needs (07/03/2022)  Utilities: Not At Risk (07/03/2022)  Financial Resource Strain: Low Risk  (09/23/2020)  Stress: No Stress Concern Present (09/23/2020)  Tobacco Use: Medium Risk (07/02/2022)    Readmission Risk Interventions    11/03/2021   11:50 AM 08/23/2021   11:48 AM 08/05/2021   11:02 AM  Readmission Risk Prevention Plan  Transportation Screening Complete Complete Complete  Medication Review Oceanographer) Complete Complete Complete  PCP or Specialist appointment within 3-5 days of discharge Complete Complete Complete  HRI or Home Care Consult Complete Complete Complete  SW Recovery Care/Counseling Consult Complete Complete Complete  Palliative Care Screening Not Applicable Not Applicable Not Applicable  Skilled Nursing Facility Not Applicable Not Applicable Not Applicable

## 2022-07-05 NOTE — Progress Notes (Signed)
Patient seen and examined at bedside today.  Hemodynamically stable.  Comfortably sitting at the edge of the bed and eating his breakfast.  Denies any worsening shortness of breath or cough.  He could not be discharged yesterday because oxygen tank never arrived.  Medically stable for discharge to home today. No change in medical management.

## 2022-07-05 NOTE — Progress Notes (Signed)
RT note: Pt. remains with no request for CPAP.

## 2022-07-05 NOTE — Progress Notes (Signed)
PT Cancellation Note  Patient Details Name: Mario [name] MRN: [id number] DOB: 08/18/1950   Cancelled Treatment:    Reason Eval/Treat Not Completed: Pain limiting ability to participate;Fatigue/lethargy limiting ability to participate PT made 2 attempts in am to engage pt with intervention. Pt declined 10am due to cervical pain, PT notified nurse. PT returned 1 hr and 20 mins later pt resting in bed and declined an additional time due to cervical pain. PT to return for PT intervention if time allows.   Rica Mote, PT   Jacqualyn Posey 07/05/2022, 11:33 AM

## 2022-08-29 ENCOUNTER — Emergency Department (HOSPITAL_COMMUNITY)
Admission: EM | Admit: 2022-08-29 | Discharge: 2022-08-30 | Disposition: A | Discharge status: Home / Self Care | Payer: No Typology Code available for payment source | Attending: Emergency Medicine | Admitting: Emergency Medicine

## 2022-08-29 DIAGNOSIS — I129 Hypertensive chronic kidney disease with stage 1 through stage 4 chronic kidney disease, or unspecified chronic kidney disease: Secondary | ICD-10-CM | POA: Insufficient documentation

## 2022-08-29 DIAGNOSIS — Z7951 Long term (current) use of inhaled steroids: Secondary | ICD-10-CM | POA: Diagnosis not present

## 2022-08-29 DIAGNOSIS — Z7901 Long term (current) use of anticoagulants: Secondary | ICD-10-CM | POA: Insufficient documentation

## 2022-08-29 DIAGNOSIS — E1122 Type 2 diabetes mellitus with diabetic chronic kidney disease: Secondary | ICD-10-CM | POA: Diagnosis not present

## 2022-08-29 DIAGNOSIS — N189 Chronic kidney disease, unspecified: Secondary | ICD-10-CM | POA: Insufficient documentation

## 2022-08-29 DIAGNOSIS — Z79899 Other long term (current) drug therapy: Secondary | ICD-10-CM | POA: Diagnosis not present

## 2022-08-29 DIAGNOSIS — Z7984 Long term (current) use of oral hypoglycemic drugs: Secondary | ICD-10-CM | POA: Diagnosis not present

## 2022-08-29 DIAGNOSIS — B029 Zoster without complications: Secondary | ICD-10-CM | POA: Insufficient documentation

## 2022-08-29 DIAGNOSIS — I2510 Atherosclerotic heart disease of native coronary artery without angina pectoris: Secondary | ICD-10-CM | POA: Insufficient documentation

## 2022-08-29 DIAGNOSIS — J449 Chronic obstructive pulmonary disease, unspecified: Secondary | ICD-10-CM | POA: Insufficient documentation

## 2022-08-29 DIAGNOSIS — Z85118 Personal history of other malignant neoplasm of bronchus and lung: Secondary | ICD-10-CM | POA: Diagnosis not present

## 2022-08-29 DIAGNOSIS — R21 Rash and other nonspecific skin eruption: Secondary | ICD-10-CM | POA: Diagnosis present

## 2022-08-29 HISTORY — DX: Dependence on supplemental oxygen: Z99.81

## 2022-08-29 NOTE — ED Provider Notes (Signed)
[place] EMERGENCY DEPARTMENT AT [facility] Provider Note   CSN: [id number] Arrival date & time: 08/29/22  2336     History  Chief Complaint  Patient presents with   Herpes Zoster   Back Pain   Groin Pain    Mario [name] is a 72 y.o. male.   Back Pain Groin Pain  Patient presents for rash.  Medical history includes DM, HTN, CKD, depression, OSA, COPD, BPH, CAD, lung cancer.  He had onset of painful rash 2 to 3 days ago.  He was seen for this yesterday and diagnosed with shingles.  He was prescribed valacyclovir and tramadol.  He has been taking his valacyclovir.  Last dose was tonight.  He states that the tramadol has not helped his pain.  He does take gabapentin at baseline.  He does have oxycodone at home which she has been taking.  Pain has been unbearable for him.  For this reason, he presents to the ED.  He was recently treated for parotitis which has improved.     Home Medications Prior to Admission medications   Medication Sig Start Date End Date Taking? Authorizing Provider  lidocaine (HM LIDOCAINE PATCH) 4 % Place 1 patch onto the skin daily. 08/30/22  Yes Gloris Manchester, MD  oxyCODONE-acetaminophen (PERCOCET) 10-325 MG tablet Take 1 tablet by mouth every 6 (six) hours as needed for up to 5 days for pain. 08/30/22 09/04/22 Yes Gloris Manchester, MD  albuterol (PROVENTIL) (2.5 MG/3ML) 0.083% nebulizer solution Inhale 3 mLs (2.5 mg total) by nebulization every 4 (four) hours as needed for wheezing or shortness of breath. Patient taking differently: Take 2.5 mg by nebulization every 6 (six) hours as needed for wheezing or shortness of breath. 08/04/20 10/02/22  Rolly Salter, MD  albuterol (VENTOLIN HFA) 108 (90 Base) MCG/ACT inhaler Inhale 2 puffs into the lungs every 4 (four) hours as needed for wheezing or shortness of breath. 11/04/21   Glade Lloyd, MD  apixaban (ELIQUIS) 5 MG TABS tablet Take 5 mg by mouth 2 (two) times daily. Patient not taking: Reported on 07/02/2022     [provider]  buPROPion (WELLBUTRIN XL) 300 MG 24 hr tablet Take 300 mg by mouth every morning. 02/05/20   [provider]  busPIRone (BUSPAR) 15 MG tablet Take 15 mg by mouth daily.    [provider]  Carboxymethylcellulose Sod PF 0.5 % SOLN Place 1 drop into both eyes 4 (four) times daily as needed (dry eyes).    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    [provider]  cyclobenzaprine (FLEXERIL) 10 MG tablet Take 30 mg by mouth at bedtime.    [provider]  cycloSPORINE (RESTASIS) 0.05 % ophthalmic emulsion Place 1 drop into both eyes every 12 (twelve) hours.    [provider]  diltiazem (CARDIZEM CD) 180 MG 24 hr capsule Take 180 mg by mouth daily.    [provider]  docusate sodium (COLACE) 100 MG capsule Take 100 mg by mouth 2 (two) times daily.    [provider]  DULoxetine (CYMBALTA) 60 MG capsule Take 1 capsule (60 mg total) by mouth 2 (two) times daily. 10/04/21 07/02/22  Earney Navy, NP  fluticasone (FLONASE) 50 MCG/ACT nasal spray Place 2 sprays into both nostrils daily.     [provider]  folic acid (FOLVITE) 1 MG tablet Take  1 mg by mouth daily.    [provider]  gabapentin (NEURONTIN) 300 MG capsule Take 1 capsule (300 mg total) by mouth 3 (three) times daily. 08/30/22 09/29/22  Gloris Manchester, MD  guaiFENesin (MUCINEX) 600 MG 12 hr tablet Take 600 mg by mouth 2 (two) times daily as needed for cough.    [provider]  lisinopril (ZESTRIL) 20 MG tablet Take 20 mg by mouth daily.    [provider]  lurasidone (LATUDA) 40 MG TABS tablet Take 40 mg by mouth every evening. After supper    [provider]  Melatonin 3 MG CAPS Take 6 mg by mouth at bedtime as needed (sleep).    [provider]  metFORMIN (GLUCOPHAGE) 850 MG tablet Take  850 mg by mouth daily with breakfast.    [provider]  methocarbamol (ROBAXIN) 750 MG tablet Take 1 tablet (750 mg total) by mouth every 6 (six) hours as needed for muscle spasms. 07/04/22   Burnadette Pop, MD  methotrexate (RHEUMATREX) 2.5 MG tablet Take 15 mg by mouth once a week. Caution:Chemotherapy. Protect from light. Tuesdays    [provider]  Mometasone Furoate 200 MCG/ACT AERO Inhale 2 puffs into the lungs 2 (two) times daily. Patient not taking: Reported on 07/02/2022 11/04/21   Glade Lloyd, MD  mycophenolate (CELLCEPT) 250 MG capsule Take 1,500 mg by mouth 2 (two) times daily.    [provider]  Nutritional Supplements (ENSURE ORIGINAL) LIQD Take 1 Bottle by mouth daily as needed (in between meals).    [provider]  ondansetron (ZOFRAN-ODT) 4 MG disintegrating tablet Take 1 tablet (4 mg total) by mouth every 8 (eight) hours as needed for nausea or vomiting. 10/04/21   Earney Navy, NP  oxcarbazepine (TRILEPTAL) 600 MG tablet Take 600 mg by mouth See admin instructions. Take 1 tablet BID and at bedtime    [provider]  oxyCODONE (OXY IR/ROXICODONE) 5 MG immediate release tablet Take 1-2 tablets (5-10 mg total) by mouth every 4 (four) hours as needed for severe pain. Patient taking differently: Take 5 mg by mouth every 4 (four) hours as needed for severe pain. 09/13/21   Dione Booze, MD  pantoprazole (PROTONIX) 40 MG tablet Take 40 mg by mouth daily. Take on an empty stomach    [provider]  polyethylene glycol powder (GLYCOLAX/MIRALAX) 17 GM/SCOOP powder Take 17 g by mouth daily.    [provider]  prednisoLONE acetate (PRED FORTE) 1 % ophthalmic suspension Place 1 drop into both eyes See admin instructions. 1 drop every hour while awake    [provider]  psyllium (METAMUCIL SMOOTH TEXTURE) 58.6 % powder Take 1 packet by mouth 3 (three) times daily. 06/29/22   Elayne Snare K, DO  senna-docusate  (SENOKOT-S) 8.6-50 MG tablet Take 1 tablet by mouth daily. 06/29/22   Elayne Snare K, DO  tamsulosin (FLOMAX) 0.4 MG CAPS capsule Take 0.8 mg by mouth daily.    [provider]  Tiotropium Bromide-Olodaterol 2.5-2.5 MCG/ACT AERS Inhale 2 each into the lungs every morning. 2 puffs    [provider]  traZODone (DESYREL) 50 MG tablet Take 75 mg by mouth at bedtime.    [provider]      Allergies    Demerol [meperidine], Zocor [simvastatin], Beet [beta vulgaris], Collard greens [wild lettuce extract (lactuca virosa)], and Liver    Review of Systems   Review of Systems  Constitutional:  Positive for fatigue.  Musculoskeletal:  Positive for back pain.  Skin:  Positive for rash.  All other systems reviewed and are negative.   Physical Exam Updated Vital Signs BP (!) 140/76 (BP Location: Left Arm)   Pulse 95   Temp 97.8 F (36.6 C) (Oral)   Resp 17   Ht 6\' 2"  (1.88 m)   Wt (!) 140.6 kg   SpO2 93%   BMI 39.80 kg/m  Physical Exam Vitals and nursing note reviewed.  Constitutional:      General: He is not in acute distress.    Appearance: Normal appearance. He is well-developed. He is not ill-appearing, toxic-appearing or diaphoretic.  HENT:     Head: Normocephalic and atraumatic.     Right Ear: External ear normal.     Left Ear: External ear normal.     Nose: Nose normal.     Mouth/Throat:     Mouth: Mucous membranes are moist.  Eyes:     Extraocular Movements: Extraocular movements intact.     Conjunctiva/sclera: Conjunctivae normal.  Cardiovascular:     Rate and Rhythm: Normal rate and regular rhythm.  Pulmonary:     Effort: Pulmonary effort is normal. No respiratory distress.  Abdominal:     Palpations: Abdomen is soft.  Musculoskeletal:        General: No swelling. Normal range of motion.     Cervical back: Normal range of motion and neck supple.     Right lower leg: No edema.     Left lower leg: No edema.  Skin:    General: Skin is  warm and dry.     Findings: Rash present.  Neurological:     General: No focal deficit present.     Mental Status: He is alert and oriented to person, place, and time.     Cranial Nerves: No cranial nerve deficit.     Sensory: No sensory deficit.     Motor: No weakness.     Coordination: Coordination normal.  Psychiatric:        Mood and Affect: Mood normal.        Behavior: Behavior normal.        Thought Content: Thought content normal.        Judgment: Judgment normal.     ED Results / Procedures / Treatments   Labs (all labs ordered are listed, but only abnormal results are displayed) Labs Reviewed  CBC WITH DIFFERENTIAL/PLATELET - Abnormal; Notable for the following components:      Result Value   RDW 16.1 (*)    Monocytes Absolute 1.1 (*)    All other components within normal limits  COMPREHENSIVE METABOLIC PANEL - Abnormal; Notable for the following components:   Sodium 134 (*)    Glucose, Bld 125 (*)    Calcium 8.8 (*)    All other components within normal limits  MAGNESIUM    EKG None  Radiology No results found.  Procedures Procedures    Medications Ordered in ED Medications  lidocaine (LIDODERM) 5 % 1 patch (1 patch Transdermal Patch Applied 08/30/22 0121)  HYDROmorphone (DILAUDID) injection 1 mg (1 mg Intravenous Given 08/30/22 0121)  diphenhydrAMINE (BENADRYL) injection 12.5 mg (12.5 mg Intravenous Given 08/30/22 0121)  lactated ringers bolus 500 mL ( Intravenous Stopped 08/30/22 0227)  gabapentin (NEURONTIN) capsule 300 mg (300 mg Oral Given 08/30/22 0413)  oxyCODONE-acetaminophen (PERCOCET/ROXICET) 5-325 MG per tablet 2 tablet (2 tablets Oral Given 08/30/22 0413)    ED Course/ Medical Decision Making/ A&P  Medical Decision Making Amount and/or Complexity of Data Reviewed Labs: ordered.  Risk OTC drugs. Prescription drug management.   This patient presents to the ED for concern of rash, this involves an extensive number  of treatment options, and is a complaint that carries with it a high risk of complications and morbidity.  The differential diagnosis includes shingles, disseminated shingles, contact dermatitis, candidiasis   Co morbidities that complicate the patient evaluation  DM, HTN, CKD, depression, OSA, COPD, BPH, CAD, lung cancer   Additional history obtained:  Additional history obtained from N/A External records from outside source obtained and reviewed including EMR   Lab Tests:  I Ordered, and personally interpreted labs.  The pertinent results include: Normal hemoglobin, no leukocytosis, normal electrolytes   Problem List / ED Course / Critical interventions / Medication management  Patient presents for painful rash.  He was diagnosed with shingles yesterday.  He was started on valacyclovir.  He has been taking oxycodone at home with minimal relief.  On exam, patient has pustular rash in distribution of right L2 dermatome.  He has a history of lung cancer, currently in remission.  He is not on any chemotherapy at this time.  He has been prescribed immunosuppressive medications for rheumatoid arthritis.  He states that he has no longer taking these.  Multimodal pain control was ordered.  Laboratory workup was initiated, the results of which were unremarkable.  Patient had improved pain while in the ED.  He was advised to continue multimodal pain control at home.  Prescriptions were provided.  He was discharged in stable condition. I ordered medication including IVF for hydration; Dilaudid, gabapentin, Percocet for analgesia Reevaluation of the patient after these medicines showed that the patient improved I have reviewed the patients home medicines and have made adjustments as needed   Social Determinants of Health:  Lives independently, has access to outpatient care through the [place]         Final Clinical Impression(s) / ED Diagnoses Final diagnoses:  Herpes zoster without  complication    Rx / DC Orders ED Discharge Orders          Ordered    oxyCODONE-acetaminophen (PERCOCET) 10-325 MG tablet  Every 6 hours PRN        08/30/22 0500    lidocaine (HM LIDOCAINE PATCH) 4 %  Every 24 hours        08/30/22 0500    gabapentin (NEURONTIN) 300 MG capsule  3 times daily        08/30/22 0502              Gloris Manchester, MD 08/30/22 [phone]

## 2022-08-29 NOTE — ED Provider Notes (Incomplete)
[place] EMERGENCY DEPARTMENT AT [facility] Provider Note   CSN: [id number] Arrival date & time: 08/29/22  2336     History {Add pertinent medical, surgical, social history, OB history to HPI:1} Chief Complaint  Patient presents with  . Herpes Zoster  . Back Pain  . Groin Pain    Mario [name] is a 72 y.o. male.   Back Pain Groin Pain  Patient presents for rash.  Medical history includes DM, HTN, CKD, depression, OSA, COPD, BPH, CAD, lung cancer.  He had onset of painful rash 2 to 3 days ago.  He was seen for this yesterday and diagnosed with shingles.  He was prescribed valacyclovir and tramadol.  He has been taking his valacyclovir.  Last dose was tonight.  He states that the tramadol has not helped his pain.  He does take gabapentin at baseline.  He does have oxycodone at home which she has been taking.  Pain has been unbearable for him.  For this reason, he presents to the ED.  He denies any recent systemic symptoms.  He was recently treated for peritonitis which has improved.     Home Medications Prior to Admission medications   Medication Sig Start Date End Date Taking? Authorizing Provider  albuterol (PROVENTIL) (2.5 MG/3ML) 0.083% nebulizer solution Inhale 3 mLs (2.5 mg total) by nebulization every 4 (four) hours as needed for wheezing or shortness of breath. Patient taking differently: Take 2.5 mg by nebulization every 6 (six) hours as needed for wheezing or shortness of breath. 08/04/20 10/02/22  Rolly Salter, MD  albuterol (VENTOLIN HFA) 108 (90 Base) MCG/ACT inhaler Inhale 2 puffs into the lungs every 4 (four) hours as needed for wheezing or shortness of breath. 11/04/21   Glade Lloyd, MD  apixaban (ELIQUIS) 5 MG TABS tablet Take 5 mg by mouth 2 (two) times daily. Patient not taking: Reported on 07/02/2022    [provider]  buPROPion (WELLBUTRIN XL) 300 MG 24 hr tablet Take 300 mg by mouth every morning. 02/05/20   [provider]   busPIRone (BUSPAR) 15 MG tablet Take 15 mg by mouth daily.    [provider]  Carboxymethylcellulose Sod PF 0.5 % SOLN Place 1 drop into both eyes 4 (four) times daily as needed (dry eyes).    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    [provider]  cyclobenzaprine (FLEXERIL) 10 MG tablet Take 30 mg by mouth at bedtime.    [provider]  cycloSPORINE (RESTASIS) 0.05 % ophthalmic emulsion Place 1 drop into both eyes every 12 (twelve) hours.    [provider]  diltiazem (CARDIZEM CD) 180 MG 24 hr capsule Take 180 mg by mouth daily.    [provider]  docusate sodium (COLACE) 100 MG capsule Take 100 mg by mouth 2 (two) times daily.    [provider]  DULoxetine (CYMBALTA) 60 MG capsule Take 1 capsule (60 mg total) by mouth 2 (two) times daily. 10/04/21 07/02/22  Earney Navy, NP  fluticasone (FLONASE) 50 MCG/ACT nasal spray Place 2 sprays into both nostrils daily.     [provider]  folic acid (FOLVITE) 1 MG tablet Take 1 mg by mouth daily.    [provider]  gabapentin (NEURONTIN) 300 MG capsule Take 3 capsules (900 mg total) by mouth at bedtime. Patient taking differently: Take 300-900 mg by  mouth See admin instructions. Take 1 capsule by mouth in the morning and 3 capsules at bedtime 10/04/21 07/02/22  Earney Navy, NP  guaiFENesin (MUCINEX) 600 MG 12 hr tablet Take 600 mg by mouth 2 (two) times daily as needed for cough.    [provider]  lisinopril (ZESTRIL) 20 MG tablet Take 20 mg by mouth daily.    [provider]  lurasidone (LATUDA) 40 MG TABS tablet Take 40 mg by mouth every evening. After supper    [provider]  Melatonin 3 MG CAPS Take 6 mg by mouth at bedtime as needed (sleep).    [provider]  metFORMIN (GLUCOPHAGE) 850 MG tablet Take 850 mg  by mouth daily with breakfast.    [provider]  methocarbamol (ROBAXIN) 750 MG tablet Take 1 tablet (750 mg total) by mouth every 6 (six) hours as needed for muscle spasms. 07/04/22   Burnadette Pop, MD  methotrexate (RHEUMATREX) 2.5 MG tablet Take 15 mg by mouth once a week. Caution:Chemotherapy. Protect from light. Tuesdays    [provider]  Mometasone Furoate 200 MCG/ACT AERO Inhale 2 puffs into the lungs 2 (two) times daily. Patient not taking: Reported on 07/02/2022 11/04/21   Glade Lloyd, MD  mycophenolate (CELLCEPT) 250 MG capsule Take 1,500 mg by mouth 2 (two) times daily.    [provider]  Nutritional Supplements (ENSURE ORIGINAL) LIQD Take 1 Bottle by mouth daily as needed (in between meals).    [provider]  ondansetron (ZOFRAN-ODT) 4 MG disintegrating tablet Take 1 tablet (4 mg total) by mouth every 8 (eight) hours as needed for nausea or vomiting. 10/04/21   Earney Navy, NP  oxcarbazepine (TRILEPTAL) 600 MG tablet Take 600 mg by mouth See admin instructions. Take 1 tablet BID and at bedtime    [provider]  oxyCODONE (OXY IR/ROXICODONE) 5 MG immediate release tablet Take 1-2 tablets (5-10 mg total) by mouth every 4 (four) hours as needed for severe pain. Patient taking differently: Take 5 mg by mouth every 4 (four) hours as needed for severe pain. 09/13/21   Dione Booze, MD  pantoprazole (PROTONIX) 40 MG tablet Take 40 mg by mouth daily. Take on an empty stomach    [provider]  polyethylene glycol powder (GLYCOLAX/MIRALAX) 17 GM/SCOOP powder Take 17 g by mouth daily.    [provider]  prednisoLONE acetate (PRED FORTE) 1 % ophthalmic suspension Place 1 drop into both eyes See admin instructions. 1 drop every hour while awake    [provider]  psyllium (METAMUCIL SMOOTH TEXTURE) 58.6 % powder Take 1 packet by mouth 3 (three) times daily. 06/29/22   Elayne Snare K, DO  senna-docusate  (SENOKOT-S) 8.6-50 MG tablet Take 1 tablet by mouth daily. 06/29/22   Elayne Snare K, DO  tamsulosin (FLOMAX) 0.4 MG CAPS capsule Take 0.8 mg by mouth daily.    [provider]  Tiotropium Bromide-Olodaterol 2.5-2.5 MCG/ACT AERS Inhale 2 each into the lungs every morning. 2 puffs    [provider]  traZODone (DESYREL) 50 MG tablet Take 75 mg by mouth at bedtime.    [provider]      Allergies    Demerol [meperidine], Zocor [simvastatin], Beet [beta vulgaris], Collard greens [wild lettuce extract (lactuca virosa)], and Liver    Review of Systems   Review of Systems  Musculoskeletal:  Positive for back pain.  Skin:  Positive for rash.  All other systems reviewed and are  negative.   Physical Exam Updated Vital Signs SpO2 92%  Physical Exam Vitals and nursing note reviewed.  Constitutional:      General: He is not in acute distress.    Appearance: Normal appearance. He is well-developed. He is not ill-appearing, toxic-appearing or diaphoretic.  HENT:     Head: Normocephalic and atraumatic.     Right Ear: External ear normal.     Left Ear: External ear normal.     Nose: Nose normal.     Mouth/Throat:     Mouth: Mucous membranes are moist.  Eyes:     Extraocular Movements: Extraocular movements intact.     Conjunctiva/sclera: Conjunctivae normal.  Cardiovascular:     Rate and Rhythm: Normal rate and regular rhythm.  Pulmonary:     Effort: Pulmonary effort is normal. No respiratory distress.  Abdominal:     Palpations: Abdomen is soft.  Musculoskeletal:        General: No swelling. Normal range of motion.     Cervical back: Normal range of motion and neck supple.     Right lower leg: No edema.     Left lower leg: No edema.  Skin:    General: Skin is warm and dry.     Findings: Rash present.  Neurological:     General: No focal deficit present.     Mental Status: He is alert and oriented to person, place, and time.     Cranial Nerves: No  cranial nerve deficit.     Sensory: No sensory deficit.     Motor: No weakness.     Coordination: Coordination normal.  Psychiatric:        Mood and Affect: Mood normal.        Behavior: Behavior normal.        Thought Content: Thought content normal.        Judgment: Judgment normal.     ED Results / Procedures / Treatments   Labs (all labs ordered are listed, but only abnormal results are displayed) Labs Reviewed - No data to display  EKG None  Radiology No results found.  Procedures Procedures  {Document cardiac monitor, telemetry assessment procedure when appropriate:1}  Medications Ordered in ED Medications - No data to display  ED Course/ Medical Decision Making/ A&P   {   Click here for ABCD2, HEART and other calculatorsREFRESH Note before signing :1}                          Medical Decision Making  ***  {Document critical care time when appropriate:1} {Document review of labs and clinical decision tools ie heart score, Chads2Vasc2 etc:1}  {Document your independent review of radiology images, and any outside records:1} {Document your discussion with family members, caretakers, and with consultants:1} {Document social determinants of health affecting pt's care:1} {Document your decision making why or why not admission, treatments were needed:1} Final Clinical Impression(s) / ED Diagnoses Final diagnoses:  None    Rx / DC Orders ED Discharge Orders     None

## 2022-08-30 ENCOUNTER — Encounter (HOSPITAL_COMMUNITY): Payer: Self-pay

## 2022-08-30 ENCOUNTER — Other Ambulatory Visit: Payer: Self-pay

## 2022-08-30 LAB — COMPREHENSIVE METABOLIC PANEL
ALT: 14 U/L (ref 0–44)
AST: 16 U/L (ref 15–41)
Albumin: 3.7 g/dL (ref 3.5–5.0)
Alkaline Phosphatase: 75 U/L (ref 38–126)
Anion gap: 9 (ref 5–15)
BUN: 17 mg/dL (ref 8–23)
CO2: 25 mmol/L (ref 22–32)
Calcium: 8.8 mg/dL — ABNORMAL LOW (ref 8.9–10.3)
Chloride: 100 mmol/L (ref 98–111)
Creatinine, Ser: 1.07 mg/dL (ref 0.61–1.24)
GFR, Estimated: >60 mL/min (ref >60)
Glucose, Bld: 125 mg/dL — ABNORMAL HIGH (ref 70–99)
Potassium: 4.2 mmol/L (ref 3.5–5.1)
Sodium: 134 mmol/L — ABNORMAL LOW (ref 135–145)
Total Bilirubin: 0.5 mg/dL (ref 0.3–1.2)
Total Protein: 7.4 g/dL (ref 6.5–8.1)

## 2022-08-30 LAB — CBC WITH DIFFERENTIAL/PLATELET
Abs Immature Granulocytes: 0.03 10*3/uL (ref 0.00–0.07)
Basophils Absolute: 0.1 10*3/uL (ref 0.0–0.1)
Basophils Relative: 1 %
Eosinophils Absolute: 0.2 10*3/uL (ref 0.0–0.5)
Eosinophils Relative: 2 %
HCT: 42 % (ref 39.0–52.0)
Hemoglobin: 13.4 g/dL (ref 13.0–17.0)
Immature Granulocytes: 0 %
Lymphocytes Relative: 16 %
Lymphs Abs: 1.4 10*3/uL (ref 0.7–4.0)
MCH: 29.6 pg (ref 26.0–34.0)
MCHC: 31.9 g/dL (ref 30.0–36.0)
MCV: 92.7 fL (ref 80.0–100.0)
Monocytes Absolute: 1.1 10*3/uL — ABNORMAL HIGH (ref 0.1–1.0)
Monocytes Relative: 12 %
Neutro Abs: 6.1 10*3/uL (ref 1.7–7.7)
Neutrophils Relative %: 69 %
Platelets: 223 10*3/uL (ref 150–400)
RBC: 4.53 MIL/uL (ref 4.22–5.81)
RDW: 16.1 % — ABNORMAL HIGH (ref 11.5–15.5)
WBC: 8.8 10*3/uL (ref 4.0–10.5)
nRBC: 0 % (ref 0.0–0.2)

## 2022-08-30 LAB — MAGNESIUM: Magnesium: 2.2 mg/dL (ref 1.7–2.4)

## 2022-08-30 MED ORDER — OXYCODONE-ACETAMINOPHEN 10-325 MG PO TABS: 1.0000 | ORAL_TABLET | Freq: Four times a day (QID) | ORAL | 0 refills | Status: AC | PRN

## 2022-08-30 MED ORDER — LACTATED RINGERS IV BOLUS
500.0000 mL | Freq: Once | INTRAVENOUS | Status: AC
  Administered 2022-08-30: 500 mL via INTRAVENOUS

## 2022-08-30 MED ORDER — LIDOCAINE 4 % EX PTCH: 1.0000 | MEDICATED_PATCH | CUTANEOUS | 0 refills | Status: DC

## 2022-08-30 MED ORDER — GABAPENTIN 300 MG PO CAPS: 300.0000 mg | ORAL_CAPSULE | Freq: Three times a day (TID) | ORAL | 0 refills | Status: DC

## 2022-08-30 MED ORDER — LIDOCAINE 5 % EX PTCH
1.0000 | MEDICATED_PATCH | CUTANEOUS | Status: DC
  Administered 2022-08-30: 1 via TRANSDERMAL
  Filled 2022-08-30: qty 1

## 2022-08-30 MED ORDER — OXYCODONE-ACETAMINOPHEN 5-325 MG PO TABS
2.0000 | ORAL_TABLET | Freq: Once | ORAL | Status: AC
  Administered 2022-08-30: 2 via ORAL
  Filled 2022-08-30: qty 2

## 2022-08-30 MED ORDER — HYDROMORPHONE HCL 1 MG/ML IJ SOLN
1.0000 mg | Freq: Once | INTRAMUSCULAR | Status: AC
  Administered 2022-08-30: 1 mg via INTRAVENOUS
  Filled 2022-08-30: qty 1

## 2022-08-30 MED ORDER — DIPHENHYDRAMINE HCL 50 MG/ML IJ SOLN
12.5000 mg | Freq: Once | INTRAMUSCULAR | Status: AC
  Administered 2022-08-30: 12.5 mg via INTRAVENOUS
  Filled 2022-08-30: qty 1

## 2022-08-30 MED ORDER — GABAPENTIN 300 MG PO CAPS
300.0000 mg | ORAL_CAPSULE | Freq: Once | ORAL | Status: AC
  Administered 2022-08-30: 300 mg via ORAL
  Filled 2022-08-30: qty 1

## 2022-08-30 NOTE — Discharge Instructions (Addendum)
Keep area of rash clean and dry.  Prescriptions for lidocaine patches and Percocet were sent to your pharmacy.  Lidocaine patches can be placed daily.  Avoid having more than 1 patch on your skin at a given time.  Take Percocet only as needed.  Continue to take your valacyclovir.  Take gabapentin 3 times per day.  Return to the emergency department for any new or worsening symptoms of concern.

## 2022-08-30 NOTE — ED Triage Notes (Signed)
Pt reports severe low back pain, right groin pain radiating down right thigh that began Saturday. Pt reports he saw a doctor Saturday but forgot to show him his back, pt was evaluated yesterday and diagnosed with Shingles. Pt reports he is not able to manage the severe pain at home, was given antiviral and tramadol rx for management yesterday. Pt also reports generalized malaise, body aches, and "feel like I have a fever." Pt reports rash to lower back and right groin.

## 2022-09-02 ENCOUNTER — Encounter (HOSPITAL_COMMUNITY): Payer: Self-pay

## 2022-09-02 ENCOUNTER — Emergency Department (HOSPITAL_COMMUNITY)
Admission: EM | Admit: 2022-09-02 | Discharge: 2022-09-02 | Disposition: A | Discharge status: Home / Self Care | Payer: Non-veteran care | Attending: Emergency Medicine | Admitting: Emergency Medicine

## 2022-09-02 DIAGNOSIS — R21 Rash and other nonspecific skin eruption: Secondary | ICD-10-CM | POA: Diagnosis present

## 2022-09-02 DIAGNOSIS — B029 Zoster without complications: Secondary | ICD-10-CM | POA: Insufficient documentation

## 2022-09-02 MED ORDER — GABAPENTIN 300 MG PO CAPS: 300.0000 mg | ORAL_CAPSULE | Freq: Three times a day (TID) | ORAL | 0 refills | Status: DC

## 2022-09-02 MED ORDER — LIDOCAINE 5 % EX PTCH
1.0000 | MEDICATED_PATCH | CUTANEOUS | Status: DC
  Administered 2022-09-02: 1 via TRANSDERMAL
  Filled 2022-09-02: qty 1

## 2022-09-02 MED ORDER — GABAPENTIN 300 MG PO CAPS
300.0000 mg | ORAL_CAPSULE | Freq: Once | ORAL | Status: AC
  Administered 2022-09-02: 300 mg via ORAL
  Filled 2022-09-02: qty 1

## 2022-09-02 MED ORDER — LIDOCAINE 4 % EX PTCH: 1.0000 | MEDICATED_PATCH | CUTANEOUS | 0 refills | Status: DC

## 2022-09-02 NOTE — ED Provider Notes (Signed)
[name] Provider Note   CSN: [id number] Arrival date & time: 09/02/22  1803     History Chief Complaint  Patient presents with   Pain   Herpes Zoster    HPI Mario [name] is a 72 y.o. male presenting for chronic pain. Longstanding history of chronic pain follows with his PCP on tramadol daily 3 times daily.  Recently diagnosed with shingles 2 days ago was prescribed Percocet from the emergency room.  States that he is coming in for further medication administration.  His not using the lidocaine patches or gabapentin as previously prescribed.  States that he has been on narcotics for years and that he needs a higher dose.  States that he called his PCP who told him to come to the emergency room. He denies fevers chills nausea vomiting syncope shortness of breath.  All of his pain is localized over his right hip where there is a large vesicular rash..   Patient's recorded medical, surgical, social, medication list and allergies were reviewed in the Snapshot window as part of the initial history.   Review of Systems   Review of Systems  Constitutional:  Negative for chills and fever.  HENT:  Negative for ear pain and sore throat.   Eyes:  Negative for pain and visual disturbance.  Respiratory:  Negative for cough and shortness of breath.   Cardiovascular:  Negative for chest pain and palpitations.  Gastrointestinal:  Negative for abdominal pain and vomiting.  Genitourinary:  Negative for dysuria and hematuria.  Musculoskeletal:  Negative for arthralgias and back pain.  Skin:  Positive for rash. Negative for color change.  Neurological:  Negative for seizures and syncope.  All other systems reviewed and are negative.   Physical Exam Updated Vital Signs There were no vitals taken for this visit. Physical Exam Vitals and nursing note reviewed.  Constitutional:      General: He is not in acute distress.    Appearance: He is  well-developed.  HENT:     Head: Normocephalic and atraumatic.  Eyes:     Conjunctiva/sclera: Conjunctivae normal.  Cardiovascular:     Rate and Rhythm: Normal rate and regular rhythm.     Heart sounds: No murmur heard. Pulmonary:     Effort: Pulmonary effort is normal. No respiratory distress.     Breath sounds: Normal breath sounds.  Abdominal:     Palpations: Abdomen is soft.     Tenderness: There is no abdominal tenderness.  Musculoskeletal:        General: No swelling.     Cervical back: Neck supple.  Skin:    General: Skin is warm and dry.     Capillary Refill: Capillary refill takes less than 2 seconds.     Findings: Rash present.  Neurological:     Mental Status: He is alert.  Psychiatric:        Mood and Affect: Mood normal.      ED Course/ Medical Decision Making/ A&P    Procedures Procedures   Medications Ordered in ED Medications  lidocaine (LIDODERM) 5 % 1 patch (1 patch Transdermal Patch Applied 09/02/22 1844)  gabapentin (NEURONTIN) capsule 300 mg (300 mg Oral Given 09/02/22 1845)    Medical Decision Making:   Patient is presenting with acute on chronic pain of his back.  Currently exacerbated by shingles flare. Is already on medication from his PCP was seen here forPain control 2 days ago, prescribed breakthrough pain medication.  States he still has it at home but is coming in for more medication before the weekend. He is not using the adjuvant medications described prior can visualize gabapentin and lidocaine patches were both prescribed by the prior provider.  Reviewed the PDMP and patient has 3 outstanding narcotic prescriptions from the last 30 days including a daily dose prescribed by his PCP. States that his primary care doctor told him to come to the emergency department "for lab work and more medicine".  Informed patient that further narcotic administration from the emergency department is contraindicated in the setting of ongoing chronic pain  treatment at home as this increases his risk for accidental overdose.  Will represcribe the therapies targeted towards his shingles including give him a written prescription for the gabapentin and the lidocaine patches since he is not able to make it to the pharmacy they were sent to 2 days ago.  No acute indication for further diagnostic care management at this time given consistency of presentation with known shingles. Patient needs to follow-up with his PCP for ongoing care management and treatment of his chronic pain.  Patient expressed understanding.  Disposition:  I have considered need for hospitalization, however, considering all of the above, I believe this patient is stable for discharge at this time.  Patient/family educated about specific return precautions for given chief complaint and symptoms.  Patient/family educated about follow-up with PCP.     Patient/family expressed understanding of return precautions and need for follow-up. Patient spoken to regarding all imaging and laboratory results and appropriate follow up for these results. All education provided in verbal form with additional information in written form. Time was allowed for answering of patient questions. Patient discharged.    Emergency Department Medication Summary:   Medications  lidocaine (LIDODERM) 5 % 1 patch (1 patch Transdermal Patch Applied 09/02/22 1844)  gabapentin (NEURONTIN) capsule 300 mg (300 mg Oral Given 09/02/22 1845)      Clinical Impression:  1. Herpes zoster without complication      Discharge   Final Clinical Impression(s) / ED Diagnoses Final diagnoses:  Herpes zoster without complication    Rx / DC Orders ED Discharge Orders          Ordered    lidocaine (HM LIDOCAINE PATCH) 4 %  Every 24 hours,   Status:  Discontinued        09/02/22 1841    gabapentin (NEURONTIN) 300 MG capsule  3 times daily,   Status:  Discontinued        09/02/22 1841    lidocaine (HM LIDOCAINE PATCH) 4 %   Every 24 hours        09/02/22 1847    gabapentin (NEURONTIN) 300 MG capsule  3 times daily        09/02/22 1847              Glyn Ade, MD 09/02/22 [phone]

## 2022-09-02 NOTE — ED Triage Notes (Signed)
Pt arrived via EMS, from home. Dx a week ago with shingles. States he does not have accurate pain control.   [address] [place] Baseline

## 2022-09-10 ENCOUNTER — Emergency Department (HOSPITAL_COMMUNITY)
Admission: EM | Admit: 2022-09-10 | Discharge: 2022-09-10 | Disposition: A | Discharge status: Home / Self Care | Payer: Non-veteran care | Attending: Emergency Medicine | Admitting: Emergency Medicine

## 2022-09-10 ENCOUNTER — Encounter (HOSPITAL_COMMUNITY): Payer: Self-pay

## 2022-09-10 ENCOUNTER — Emergency Department (HOSPITAL_COMMUNITY): Payer: Non-veteran care

## 2022-09-10 ENCOUNTER — Other Ambulatory Visit: Payer: Self-pay

## 2022-09-10 DIAGNOSIS — Z7901 Long term (current) use of anticoagulants: Secondary | ICD-10-CM | POA: Insufficient documentation

## 2022-09-10 DIAGNOSIS — B029 Zoster without complications: Secondary | ICD-10-CM

## 2022-09-10 DIAGNOSIS — B028 Zoster with other complications: Secondary | ICD-10-CM | POA: Insufficient documentation

## 2022-09-10 DIAGNOSIS — E871 Hypo-osmolality and hyponatremia: Secondary | ICD-10-CM | POA: Insufficient documentation

## 2022-09-10 DIAGNOSIS — R21 Rash and other nonspecific skin eruption: Secondary | ICD-10-CM | POA: Diagnosis not present

## 2022-09-10 DIAGNOSIS — E119 Type 2 diabetes mellitus without complications: Secondary | ICD-10-CM | POA: Diagnosis not present

## 2022-09-10 DIAGNOSIS — M545 Low back pain, unspecified: Secondary | ICD-10-CM | POA: Diagnosis present

## 2022-09-10 DIAGNOSIS — Z7951 Long term (current) use of inhaled steroids: Secondary | ICD-10-CM | POA: Diagnosis not present

## 2022-09-10 DIAGNOSIS — R110 Nausea: Secondary | ICD-10-CM | POA: Insufficient documentation

## 2022-09-10 DIAGNOSIS — J449 Chronic obstructive pulmonary disease, unspecified: Secondary | ICD-10-CM | POA: Insufficient documentation

## 2022-09-10 DIAGNOSIS — Z7984 Long term (current) use of oral hypoglycemic drugs: Secondary | ICD-10-CM | POA: Insufficient documentation

## 2022-09-10 DIAGNOSIS — Z720 Tobacco use: Secondary | ICD-10-CM | POA: Diagnosis not present

## 2022-09-10 DIAGNOSIS — G8929 Other chronic pain: Secondary | ICD-10-CM | POA: Diagnosis not present

## 2022-09-10 DIAGNOSIS — I10 Essential (primary) hypertension: Secondary | ICD-10-CM | POA: Diagnosis not present

## 2022-09-10 DIAGNOSIS — M5441 Lumbago with sciatica, right side: Secondary | ICD-10-CM | POA: Diagnosis not present

## 2022-09-10 LAB — CBC WITH DIFFERENTIAL/PLATELET
Abs Immature Granulocytes: 0.03 10*3/uL (ref 0.00–0.07)
Basophils Absolute: 0.1 10*3/uL (ref 0.0–0.1)
Basophils Relative: 1 %
Eosinophils Absolute: 0.2 10*3/uL (ref 0.0–0.5)
Eosinophils Relative: 2 %
HCT: 45.1 % (ref 39.0–52.0)
Hemoglobin: 14.3 g/dL (ref 13.0–17.0)
Immature Granulocytes: 0 %
Lymphocytes Relative: 17 %
Lymphs Abs: 1.8 10*3/uL (ref 0.7–4.0)
MCH: 30.4 pg (ref 26.0–34.0)
MCHC: 31.7 g/dL (ref 30.0–36.0)
MCV: 96 fL (ref 80.0–100.0)
Monocytes Absolute: 0.9 10*3/uL (ref 0.1–1.0)
Monocytes Relative: 9 %
Neutro Abs: 7.3 10*3/uL (ref 1.7–7.7)
Neutrophils Relative %: 71 %
Platelets: 322 10*3/uL (ref 150–400)
RBC: 4.7 MIL/uL (ref 4.22–5.81)
RDW: 15.4 % (ref 11.5–15.5)
WBC: 10.3 10*3/uL (ref 4.0–10.5)
nRBC: 0 % (ref 0.0–0.2)

## 2022-09-10 LAB — COMPREHENSIVE METABOLIC PANEL
ALT: 15 U/L (ref 0–44)
AST: 25 U/L (ref 15–41)
Albumin: 3.8 g/dL (ref 3.5–5.0)
Alkaline Phosphatase: 89 U/L (ref 38–126)
Anion gap: 10 (ref 5–15)
BUN: 19 mg/dL (ref 8–23)
CO2: 23 mmol/L (ref 22–32)
Calcium: 8.6 mg/dL — ABNORMAL LOW (ref 8.9–10.3)
Chloride: 99 mmol/L (ref 98–111)
Creatinine, Ser: 0.99 mg/dL (ref 0.61–1.24)
GFR, Estimated: >60 mL/min (ref >60)
Glucose, Bld: 110 mg/dL — ABNORMAL HIGH (ref 70–99)
Potassium: 5 mmol/L (ref 3.5–5.1)
Sodium: 132 mmol/L — ABNORMAL LOW (ref 135–145)
Total Bilirubin: 0.5 mg/dL (ref 0.3–1.2)
Total Protein: 7.7 g/dL (ref 6.5–8.1)

## 2022-09-10 LAB — I-STAT CHEM 8, ED
BUN: 23 mg/dL (ref 8–23)
Calcium, Ion: 1.07 mmol/L — ABNORMAL LOW (ref 1.15–1.40)
Chloride: 101 mmol/L (ref 98–111)
Creatinine, Ser: 0.9 mg/dL (ref 0.61–1.24)
Glucose, Bld: 107 mg/dL — ABNORMAL HIGH (ref 70–99)
HCT: 42 % (ref 39.0–52.0)
Hemoglobin: 14.3 g/dL (ref 13.0–17.0)
Potassium: 5.3 mmol/L — ABNORMAL HIGH (ref 3.5–5.1)
Sodium: 136 mmol/L (ref 135–145)
TCO2: 28 mmol/L (ref 22–32)

## 2022-09-10 LAB — CBG MONITORING, ED: Glucose-Capillary: 127 mg/dL — ABNORMAL HIGH (ref 70–99)

## 2022-09-10 LAB — TROPONIN I (HIGH SENSITIVITY)
Troponin I (High Sensitivity): 5 ng/L (ref <18)
Troponin I (High Sensitivity): 6 ng/L (ref <18)

## 2022-09-10 MED ORDER — LISINOPRIL 10 MG PO TABS
10.0000 mg | ORAL_TABLET | Freq: Once | ORAL | Status: AC
  Administered 2022-09-10: 10 mg via ORAL
  Filled 2022-09-10: qty 1

## 2022-09-10 MED ORDER — OXYCODONE HCL 5 MG PO TABS: 5.0000 mg | ORAL_TABLET | ORAL | 0 refills | Status: DC | PRN

## 2022-09-10 MED ORDER — HYDROMORPHONE HCL 1 MG/ML IJ SOLN
1.0000 mg | Freq: Once | INTRAMUSCULAR | Status: AC
  Administered 2022-09-10: 1 mg via INTRAVENOUS
  Filled 2022-09-10: qty 1

## 2022-09-10 MED ORDER — HYDRALAZINE HCL 20 MG/ML IJ SOLN
10.0000 mg | Freq: Once | INTRAMUSCULAR | Status: AC
  Administered 2022-09-10: 10 mg via INTRAVENOUS
  Filled 2022-09-10: qty 1

## 2022-09-10 MED ORDER — ONDANSETRON HCL 4 MG/2ML IJ SOLN
4.0000 mg | Freq: Once | INTRAMUSCULAR | Status: AC
  Administered 2022-09-10: 4 mg via INTRAVENOUS
  Filled 2022-09-10: qty 2

## 2022-09-10 NOTE — Discharge Instructions (Addendum)
You were seen in the emergency department for back pain. Your labs were unremarkable at this time and your imaging was reassuring. I believe this is likely chronic pain worsened with the area of shingles on your low back. A prescription for Oxycodone was sent to your pharmacy. Please take this as prescribed and follow up with your primary care provider for further refills if needed.

## 2022-09-10 NOTE — ED Provider Notes (Signed)
[place] EMERGENCY DEPARTMENT AT [facility] Provider Note   CSN: [id number] Arrival date & time: 09/10/22  1834     History Chief Complaint  Patient presents with   Back Pain    Mario [name] is a 72 y.o. male.  Patient with past medical history significant for hypertension, type 2 diabetes, tobacco use, OSA, COPD  who presented to the emergency department concerns of low back pain. Reports that he has been taking tramadol for chronic pain as prescribed by primary care provider.  Also currently being treated for shingles rash to the area of his low back which is painful.  Feels that the low back pain is currently is endorsing is sciatic in nature with radiation of the right hip into the right knee.  Denies any saddle paresthesias, bowel or bladder incontinence.  Denies taking his blood pressure medications over the last several days.  After initial questions, patient reporting feeling nausea and sweaty. No acute chest pain or SOB at this time. Does endorse poor oral intake with food and water today.   Back Pain      Home Medications Prior to Admission medications   Medication Sig Start Date End Date Taking? Authorizing Provider  albuterol (PROVENTIL) (2.5 MG/3ML) 0.083% nebulizer solution Inhale 3 mLs (2.5 mg total) by nebulization every 4 (four) hours as needed for wheezing or shortness of breath. Patient taking differently: Take 2.5 mg by nebulization every 6 (six) hours as needed for wheezing or shortness of breath. 08/04/20 10/02/22  Rolly Salter, MD  albuterol (VENTOLIN HFA) 108 (90 Base) MCG/ACT inhaler Inhale 2 puffs into the lungs every 4 (four) hours as needed for wheezing or shortness of breath. 11/04/21   Glade Lloyd, MD  apixaban (ELIQUIS) 5 MG TABS tablet Take 5 mg by mouth 2 (two) times daily. Patient not taking: Reported on 07/02/2022    [provider]  buPROPion (WELLBUTRIN XL) 300 MG 24 hr tablet Take 300 mg by mouth every morning. 02/05/20    [provider]  busPIRone (BUSPAR) 15 MG tablet Take 15 mg by mouth daily.    [provider]  Carboxymethylcellulose Sod PF 0.5 % SOLN Place 1 drop into both eyes 4 (four) times daily as needed (dry eyes).    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    [provider]  cyclobenzaprine (FLEXERIL) 10 MG tablet Take 30 mg by mouth at bedtime.    [provider]  cycloSPORINE (RESTASIS) 0.05 % ophthalmic emulsion Place 1 drop into both eyes every 12 (twelve) hours.    [provider]  diltiazem (CARDIZEM CD) 180 MG 24 hr capsule Take 180 mg by mouth daily.    [provider]  docusate sodium (COLACE) 100 MG capsule Take 100 mg by mouth 2 (two) times daily.    [provider]  DULoxetine (CYMBALTA) 60 MG capsule Take 1 capsule (60 mg total) by mouth 2 (two) times daily. 10/04/21 07/02/22  Earney Navy, NP  fluticasone (FLONASE) 50 MCG/ACT nasal spray Place 2 sprays into both nostrils daily.     [provider]  folic acid (FOLVITE) 1 MG tablet Take 1 mg by mouth daily.    [provider]  gabapentin (NEURONTIN) 300 MG capsule Take 1 capsule (300 mg total) by mouth 3 (three) times daily for 10 days. 09/02/22 09/12/22  Glyn Ade, MD  guaiFENesin (  MUCINEX) 600 MG 12 hr tablet Take 600 mg by mouth 2 (two) times daily as needed for cough.    [provider]  lidocaine (HM LIDOCAINE PATCH) 4 % Place 1 patch onto the skin daily. 09/02/22   Glyn Ade, MD  lisinopril (ZESTRIL) 20 MG tablet Take 20 mg by mouth daily.    [provider]  lurasidone (LATUDA) 40 MG TABS tablet Take 40 mg by mouth every evening. After supper    [provider]  Melatonin 3 MG CAPS Take 6 mg by mouth at bedtime as needed (sleep).    [provider]  metFORMIN (GLUCOPHAGE) 850 MG tablet Take  850 mg by mouth daily with breakfast.    [provider]  methocarbamol (ROBAXIN) 750 MG tablet Take 1 tablet (750 mg total) by mouth every 6 (six) hours as needed for muscle spasms. 07/04/22   Burnadette Pop, MD  methotrexate (RHEUMATREX) 2.5 MG tablet Take 15 mg by mouth once a week. Caution:Chemotherapy. Protect from light. Tuesdays    [provider]  Mometasone Furoate 200 MCG/ACT AERO Inhale 2 puffs into the lungs 2 (two) times daily. Patient not taking: Reported on 07/02/2022 11/04/21   Glade Lloyd, MD  mycophenolate (CELLCEPT) 250 MG capsule Take 1,500 mg by mouth 2 (two) times daily.    [provider]  Nutritional Supplements (ENSURE ORIGINAL) LIQD Take 1 Bottle by mouth daily as needed (in between meals).    [provider]  ondansetron (ZOFRAN-ODT) 4 MG disintegrating tablet Take 1 tablet (4 mg total) by mouth every 8 (eight) hours as needed for nausea or vomiting. 10/04/21   Earney Navy, NP  oxcarbazepine (TRILEPTAL) 600 MG tablet Take 600 mg by mouth See admin instructions. Take 1 tablet BID and at bedtime    [provider]  oxyCODONE (OXY IR/ROXICODONE) 5 MG immediate release tablet Take 1-2 tablets (5-10 mg total) by mouth every 4 (four) hours as needed for severe pain. 09/10/22   Smitty Knudsen, PA-C  pantoprazole (PROTONIX) 40 MG tablet Take 40 mg by mouth daily. Take on an empty stomach    [provider]  polyethylene glycol powder (GLYCOLAX/MIRALAX) 17 GM/SCOOP powder Take 17 g by mouth daily.    [provider]  prednisoLONE acetate (PRED FORTE) 1 % ophthalmic suspension Place 1 drop into both eyes See admin instructions. 1 drop every hour while awake    [provider]  psyllium (METAMUCIL SMOOTH TEXTURE) 58.6 % powder Take 1 packet by mouth 3 (three) times daily. 06/29/22   Elayne Snare K, DO  senna-docusate (SENOKOT-S) 8.6-50 MG tablet Take 1 tablet by mouth daily. 06/29/22   Elayne Snare K, DO   tamsulosin (FLOMAX) 0.4 MG CAPS capsule Take 0.8 mg by mouth daily.    [provider]  Tiotropium Bromide-Olodaterol 2.5-2.5 MCG/ACT AERS Inhale 2 each into the lungs every morning. 2 puffs    [provider]  traZODone (DESYREL) 50 MG tablet Take 75 mg by mouth at bedtime.    [provider]      Allergies    Demerol [meperidine], Zocor [simvastatin], Beet [beta vulgaris], Collard greens [wild lettuce extract (lactuca virosa)], and Liver    Review of Systems   Review of Systems  Musculoskeletal:  Positive for back pain.  All other systems reviewed and are negative.   Physical Exam Updated Vital Signs BP (!) 155/97   Pulse 92   Temp 98.4 F (36.9 C)   Resp 18  SpO2 92%  Physical Exam Vitals and nursing note reviewed.  Constitutional:      General: He is not in acute distress.    Appearance: He is well-developed.  HENT:     Head: Normocephalic and atraumatic.  Eyes:     Conjunctiva/sclera: Conjunctivae normal.  Cardiovascular:     Rate and Rhythm: Normal rate and regular rhythm.     Heart sounds: No murmur heard. Pulmonary:     Effort: Pulmonary effort is normal. No respiratory distress.     Breath sounds: Normal breath sounds.  Abdominal:     Palpations: Abdomen is soft.     Tenderness: There is no abdominal tenderness.  Musculoskeletal:        General: Tenderness present. No swelling.     Cervical back: Neck supple.  Skin:    General: Skin is warm and dry.     Capillary Refill: Capillary refill takes less than 2 seconds.     Findings: Rash present.     Comments: Slightly sweaty on initial evaluation but resolved quickly, potentially due to pain.  Shingles rash still present on right lumbar region.  Neurological:     Mental Status: He is alert.  Psychiatric:        Mood and Affect: Mood normal.     ED Results / Procedures / Treatments   Labs (all labs ordered are listed, but only abnormal results are displayed) Labs Reviewed   COMPREHENSIVE METABOLIC PANEL - Abnormal; Notable for the following components:      Result Value   Sodium 132 (*)    Glucose, Bld 110 (*)    Calcium 8.6 (*)    All other components within normal limits  CBG MONITORING, ED - Abnormal; Notable for the following components:   Glucose-Capillary 127 (*)    All other components within normal limits  I-STAT CHEM 8, ED - Abnormal; Notable for the following components:   Potassium 5.3 (*)    Glucose, Bld 107 (*)    Calcium, Ion 1.07 (*)    All other components within normal limits  CBC WITH DIFFERENTIAL/PLATELET  TROPONIN I (HIGH SENSITIVITY)  TROPONIN I (HIGH SENSITIVITY)    EKG EKG Interpretation Date/Time:  Saturday September 10 2022 19:01:46 EDT Ventricular Rate:  97 PR Interval:  200 QRS Duration:  121 QT Interval:  369 QTC Calculation: 469 R Axis:   -18  Text Interpretation: Sinus rhythm Nonspecific intraventricular conduction delay No significant change since last tracing Confirmed by Richardean Canal [phone]) on 09/10/2022 7:05:23 PM  Radiology CT Lumbar Spine Wo Contrast  Result Date: 09/10/2022 CLINICAL DATA:  Low back pain after fall 1 week ago EXAM: CT LUMBAR SPINE WITHOUT CONTRAST TECHNIQUE: Multidetector CT imaging of the lumbar spine was performed without intravenous contrast administration. Multiplanar CT image reconstructions were also generated. RADIATION DOSE REDUCTION: This exam was performed according to the departmental dose-optimization program which includes automated exposure control, adjustment of the mA and/or kV according to patient size and/or use of iterative reconstruction technique. 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 Alignment: No evidence of traumatic malalignment. Vertebrae: No acute fracture or focal pathologic process. Paraspinal and other soft  tissues: No acute abnormality. Aortic atherosclerotic calcification. Disc levels: Multilevel spondylosis with bulky anterior osteophytes degenerative disc disease with degenerative endplate changes greatest at L3-L4. No high-grade spinal canal or neural foraminal narrowing. IMPRESSION: No acute fracture. Electronically Signed  By: Minerva Fester M.D.   On: 09/10/2022 20:40   DG Chest Port 1 View  Result Date: 09/10/2022 CLINICAL DATA:  Fall 1 week ago with progressively worsening back pain. EXAM: PORTABLE CHEST 1 VIEW COMPARISON:  Radiograph and CT 06/29/2022 FINDINGS: Stable cardiomediastinal silhouette. Left greater than right basilar airspace opacities are similar to prior. Small left pleural effusion. No pneumothorax. IMPRESSION: Similar bibasilar airspace opacities compatible with atelectasis or pneumonia. Small left pleural effusion. Electronically Signed   By: Minerva Fester M.D.   On: 09/10/2022 20:19    Procedures Procedures   Medications Ordered in ED Medications  HYDROmorphone (DILAUDID) injection 1 mg (1 mg Intravenous Given 09/10/22 1920)  hydrALAZINE (APRESOLINE) injection 10 mg (10 mg Intravenous Given 09/10/22 1920)  lisinopril (ZESTRIL) tablet 10 mg (10 mg Oral Given 09/10/22 1927)  ondansetron (ZOFRAN) injection 4 mg (4 mg Intravenous Given 09/10/22 1927)  HYDROmorphone (DILAUDID) injection 1 mg (1 mg Intravenous Given 09/10/22 2117)    ED Course/ Medical Decision Making/ A&P Clinical Course as of 09/10/22 2313  Sat Sep 10, 2022  2108 MCH: 30.4 [OZ]    Clinical Course User Index [OZ] Smitty Knudsen, PA-C                           Medical Decision Making Amount and/or Complexity of Data Reviewed Labs: ordered. Decision-making details documented in ED Course. Radiology: ordered.  Risk Prescription drug management.   This patient presents to the ED for concern of back pain.  Differential diagnosis includes sciatica, herpes zoster, aortic dissection, bowel  obstruction   Lab Tests:  I Ordered, and personally interpreted labs.  The pertinent results include: CBC unremarkable, CMP with mild hyponatremia, CBG at 127, troponin negative at 5   Imaging Studies ordered:  I ordered imaging studies including chest x-ray, CT lumbar spine I independently visualized and interpreted imaging which showed some atelectasis, no evidence of any acute abnormality in the lumbar spine region without any evidence of stenosis I agree with the radiologist interpretation   Medicines ordered and prescription drug management:  I ordered medication including Dilaudid, hydralazine, Zofran, lisinopril for pain, hypertension, nausea Reevaluation of the patient after these medicines showed that the patient improved I have reviewed the patients home medicines and have made adjustments as needed   Problem List / ED Course:  Patient presented to the emergency department concerns of low back pain.  He has been seen recently emergency department concerns of back pain caused by shingles.  Patient still has shingles rash present in the lumbar spine region.  No significant tenderness to palpation along the paraspinal muscles and no acute lower extremity weakness or numbness.  Patient denies any saddle paresthesia.  Will evaluate with labs and imaging given the patient is hypertensive and experienced a brief episode of nausea with some associated diaphoresis. CBC unremarkable, CMP with mild hyponatremia 132, troponin negative at 6, CBG at 127.  Chest x-ray and CT lumbar spine without any acute abnormalities noted.  Patient obtained adequate pain control and blood pressure improved from last Dilaudid, hydralazine, Zofran, lisinopril. A course of oxycodone to patient's pharmacy for continued pain management. Advised patient to follow-up with primary care doctor/pain management for further management of pain control.  Patient's symptoms appear to be consistent with neurologic pain due  to herpes zoster rash present on lower back. Patient is agreeable with this treatment plan verbalized understanding return precautions.  All questions answered prior to patient discharge.  Low concern at this time aortic dissection or more concerning pathology causing patient's symptoms requiring further workup or evaluation at this time. Discussed strict return precautions with patient.  Final Clinical Impression(s) / ED Diagnoses Final diagnoses:  Herpes zoster without complication  Chronic right-sided low back pain with right-sided sciatica    Rx / DC Orders ED Discharge Orders          Ordered    oxyCODONE (OXY IR/ROXICODONE) 5 MG immediate release tablet  Every 4 hours PRN        09/10/22 2111              Smitty Knudsen, PA-C 09/10/22 2314    Charlynne Pander, MD 09/11/22 1453

## 2022-09-10 NOTE — ED Notes (Signed)
Unsuccessful IV attempt x 3 

## 2022-09-10 NOTE — ED Triage Notes (Signed)
BIBA from home- had a fall a week ago, progressively worsening lower back pain, pt is also on medication treating shingles that is located on lower back. Pt has not taken BP meds in several days. 260/100 BP initial, 155/84 BP 97 HR 93% 3 lpm, hx of COPD 131 cbg 97.5 temp  8/10 pain

## 2022-09-11 ENCOUNTER — Encounter (HOSPITAL_COMMUNITY): Payer: Self-pay

## 2022-09-11 ENCOUNTER — Other Ambulatory Visit: Payer: Self-pay

## 2022-09-11 ENCOUNTER — Emergency Department (HOSPITAL_COMMUNITY)
Admission: EM | Admit: 2022-09-11 | Discharge: 2022-09-11 | Disposition: A | Discharge status: Home / Self Care | Payer: Non-veteran care | Attending: Emergency Medicine | Admitting: Emergency Medicine

## 2022-09-11 ENCOUNTER — Emergency Department (HOSPITAL_COMMUNITY): Payer: Non-veteran care

## 2022-09-11 DIAGNOSIS — Z7984 Long term (current) use of oral hypoglycemic drugs: Secondary | ICD-10-CM | POA: Diagnosis not present

## 2022-09-11 DIAGNOSIS — J449 Chronic obstructive pulmonary disease, unspecified: Secondary | ICD-10-CM | POA: Insufficient documentation

## 2022-09-11 DIAGNOSIS — I10 Essential (primary) hypertension: Secondary | ICD-10-CM | POA: Insufficient documentation

## 2022-09-11 DIAGNOSIS — Z7951 Long term (current) use of inhaled steroids: Secondary | ICD-10-CM | POA: Diagnosis not present

## 2022-09-11 DIAGNOSIS — R531 Weakness: Secondary | ICD-10-CM | POA: Insufficient documentation

## 2022-09-11 DIAGNOSIS — Z7901 Long term (current) use of anticoagulants: Secondary | ICD-10-CM | POA: Diagnosis not present

## 2022-09-11 DIAGNOSIS — E119 Type 2 diabetes mellitus without complications: Secondary | ICD-10-CM | POA: Insufficient documentation

## 2022-09-11 DIAGNOSIS — Z9981 Dependence on supplemental oxygen: Secondary | ICD-10-CM | POA: Insufficient documentation

## 2022-09-11 DIAGNOSIS — Z79899 Other long term (current) drug therapy: Secondary | ICD-10-CM | POA: Insufficient documentation

## 2022-09-11 LAB — CBC
HCT: 45.6 % (ref 39.0–52.0)
Hemoglobin: 14.4 g/dL (ref 13.0–17.0)
MCH: 30.1 pg (ref 26.0–34.0)
MCHC: 31.6 g/dL (ref 30.0–36.0)
MCV: 95.2 fL (ref 80.0–100.0)
Platelets: 314 10*3/uL (ref 150–400)
RBC: 4.79 MIL/uL (ref 4.22–5.81)
RDW: 15.6 % — ABNORMAL HIGH (ref 11.5–15.5)
WBC: 9.6 10*3/uL (ref 4.0–10.5)
nRBC: 0 % (ref 0.0–0.2)

## 2022-09-11 LAB — URINALYSIS, ROUTINE W REFLEX MICROSCOPIC
Bilirubin Urine: NEGATIVE
Glucose, UA: NEGATIVE mg/dL
Hgb urine dipstick: NEGATIVE
Ketones, ur: NEGATIVE mg/dL
Leukocytes,Ua: NEGATIVE
Nitrite: NEGATIVE
Protein, ur: NEGATIVE mg/dL
Specific Gravity, Urine: 1.014 (ref 1.005–1.030)
pH: 7 (ref 5.0–8.0)

## 2022-09-11 LAB — BASIC METABOLIC PANEL
Anion gap: 8 (ref 5–15)
BUN: 18 mg/dL (ref 8–23)
CO2: 27 mmol/L (ref 22–32)
Calcium: 8.8 mg/dL — ABNORMAL LOW (ref 8.9–10.3)
Chloride: 100 mmol/L (ref 98–111)
Creatinine, Ser: 0.74 mg/dL (ref 0.61–1.24)
GFR, Estimated: >60 mL/min (ref >60)
Glucose, Bld: 162 mg/dL — ABNORMAL HIGH (ref 70–99)
Potassium: 4.6 mmol/L (ref 3.5–5.1)
Sodium: 135 mmol/L (ref 135–145)

## 2022-09-11 LAB — TROPONIN I (HIGH SENSITIVITY): Troponin I (High Sensitivity): 5 ng/L (ref <18)

## 2022-09-11 LAB — CBG MONITORING, ED: Glucose-Capillary: 176 mg/dL — ABNORMAL HIGH (ref 70–99)

## 2022-09-11 MED ORDER — OXYCODONE HCL 5 MG PO TABS
5.0000 mg | ORAL_TABLET | Freq: Once | ORAL | Status: AC
  Administered 2022-09-11: 5 mg via ORAL
  Filled 2022-09-11: qty 1

## 2022-09-11 MED ORDER — LACTATED RINGERS IV BOLUS
500.0000 mL | Freq: Once | INTRAVENOUS | Status: AC
  Administered 2022-09-11: 500 mL via INTRAVENOUS

## 2022-09-11 MED ORDER — ONDANSETRON HCL 4 MG/2ML IJ SOLN
4.0000 mg | Freq: Once | INTRAMUSCULAR | Status: AC
  Administered 2022-09-11: 4 mg via INTRAVENOUS
  Filled 2022-09-11: qty 2

## 2022-09-11 MED ORDER — AMOXICILLIN-POT CLAVULANATE 875-125 MG PO TABS: 1.0000 | ORAL_TABLET | Freq: Two times a day (BID) | ORAL | 0 refills | Status: AC

## 2022-09-11 MED ORDER — AMOXICILLIN-POT CLAVULANATE 875-125 MG PO TABS
1.0000 | ORAL_TABLET | Freq: Once | ORAL | Status: AC
  Administered 2022-09-11: 1 via ORAL
  Filled 2022-09-11: qty 1

## 2022-09-11 NOTE — ED Triage Notes (Signed)
Pt BIB [facility] from home. Pt presents with increased difficulty with mobility and frequent falls. Denies injuries from the fall. Pt is currently receiving treatment for shingles on his lower back.    EMS Vitals  126/67 HR 100 RR 18 SpO2 96% on 4L O2 via N/C (baseline)

## 2022-09-11 NOTE — ED Provider Notes (Signed)
[place] EMERGENCY DEPARTMENT AT [facility] Provider Note   CSN: [id number] Arrival date & time: 09/11/22  1258     History  Chief Complaint  Patient presents with   Weakness    Mario [name] is a 72 y.o. male.   Weakness 72 year old male history of hypertension, type 2 diabetes, OSA, COPD on baseline 4 L of oxygen presenting for generalized weakness.  He was seen here yesterday, recently has been seen multiple times for shingles.  Yesterday he had a thorough workup which was overall unremarkable and discharged home.  He lives at home with other men who are able to help him.  Today he just generally does not feel well.  He feels tired and generalized weakness but no focal weakness.  No headache or vomiting or diarrhea.  No chest pain or difficulty breathing.  No worsening cough.  He has not had any syncope or new falls.  He has pain from his shingles which is unchanged.  No chills or fevers.     Home Medications Prior to Admission medications   Medication Sig Start Date End Date Taking? Authorizing Provider  amoxicillin-clavulanate (AUGMENTIN) 875-125 MG tablet Take 1 tablet by mouth 2 (two) times daily for 7 days. 09/11/22 09/18/22 Yes Laurence Spates, MD  albuterol (PROVENTIL) (2.5 MG/3ML) 0.083% nebulizer solution Inhale 3 mLs (2.5 mg total) by nebulization every 4 (four) hours as needed for wheezing or shortness of breath. Patient taking differently: Take 2.5 mg by nebulization every 6 (six) hours as needed for wheezing or shortness of breath. 08/04/20 10/02/22  Rolly Salter, MD  albuterol (VENTOLIN HFA) 108 (90 Base) MCG/ACT inhaler Inhale 2 puffs into the lungs every 4 (four) hours as needed for wheezing or shortness of breath. 11/04/21   Glade Lloyd, MD  apixaban (ELIQUIS) 5 MG TABS tablet Take 5 mg by mouth 2 (two) times daily. Patient not taking: Reported on 07/02/2022    [provider]  buPROPion (WELLBUTRIN XL) 300 MG 24 hr tablet Take 300 mg by  mouth every morning. 02/05/20   [provider]  busPIRone (BUSPAR) 15 MG tablet Take 15 mg by mouth daily.    [provider]  Carboxymethylcellulose Sod PF 0.5 % SOLN Place 1 drop into both eyes 4 (four) times daily as needed (dry eyes).    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    [provider]  cyclobenzaprine (FLEXERIL) 10 MG tablet Take 30 mg by mouth at bedtime.    [provider]  cycloSPORINE (RESTASIS) 0.05 % ophthalmic emulsion Place 1 drop into both eyes every 12 (twelve) hours.    [provider]  diltiazem (CARDIZEM CD) 180 MG 24 hr capsule Take 180 mg by mouth daily.    [provider]  docusate sodium (COLACE) 100 MG capsule Take 100 mg by mouth 2 (two) times daily.    [provider]  DULoxetine (CYMBALTA) 60 MG capsule Take 1 capsule (60 mg total) by mouth 2 (two) times daily. 10/04/21 07/02/22  Earney Navy, NP  fluticasone (FLONASE) 50 MCG/ACT nasal spray Place 2 sprays into both nostrils daily.     [provider]  folic acid (FOLVITE) 1 MG tablet Take 1 mg by mouth daily.    [provider]  gabapentin (NEURONTIN) 300 MG capsule Take 1 capsule (300 mg total) by mouth 3 (three) times daily  for 10 days. 09/02/22 09/12/22  Glyn Ade, MD  guaiFENesin (MUCINEX) 600 MG 12 hr tablet Take 600 mg by mouth 2 (two) times daily as needed for cough.    [provider]  lidocaine (HM LIDOCAINE PATCH) 4 % Place 1 patch onto the skin daily. 09/02/22   Glyn Ade, MD  lisinopril (ZESTRIL) 20 MG tablet Take 20 mg by mouth daily.    [provider]  lurasidone (LATUDA) 40 MG TABS tablet Take 40 mg by mouth every evening. After supper    [provider]  Melatonin 3 MG CAPS Take 6 mg by mouth at bedtime as needed (sleep).    [provider]  metFORMIN  (GLUCOPHAGE) 850 MG tablet Take 850 mg by mouth daily with breakfast.    [provider]  methocarbamol (ROBAXIN) 750 MG tablet Take 1 tablet (750 mg total) by mouth every 6 (six) hours as needed for muscle spasms. 07/04/22   Burnadette Pop, MD  methotrexate (RHEUMATREX) 2.5 MG tablet Take 15 mg by mouth once a week. Caution:Chemotherapy. Protect from light. Tuesdays    [provider]  Mometasone Furoate 200 MCG/ACT AERO Inhale 2 puffs into the lungs 2 (two) times daily. Patient not taking: Reported on 07/02/2022 11/04/21   Glade Lloyd, MD  mycophenolate (CELLCEPT) 250 MG capsule Take 1,500 mg by mouth 2 (two) times daily.    [provider]  Nutritional Supplements (ENSURE ORIGINAL) LIQD Take 1 Bottle by mouth daily as needed (in between meals).    [provider]  ondansetron (ZOFRAN-ODT) 4 MG disintegrating tablet Take 1 tablet (4 mg total) by mouth every 8 (eight) hours as needed for nausea or vomiting. 10/04/21   Earney Navy, NP  oxcarbazepine (TRILEPTAL) 600 MG tablet Take 600 mg by mouth See admin instructions. Take 1 tablet BID and at bedtime    [provider]  oxyCODONE (OXY IR/ROXICODONE) 5 MG immediate release tablet Take 1-2 tablets (5-10 mg total) by mouth every 4 (four) hours as needed for severe pain. 09/10/22   Smitty Knudsen, PA-C  pantoprazole (PROTONIX) 40 MG tablet Take 40 mg by mouth daily. Take on an empty stomach    [provider]  polyethylene glycol powder (GLYCOLAX/MIRALAX) 17 GM/SCOOP powder Take 17 g by mouth daily.    [provider]  prednisoLONE acetate (PRED FORTE) 1 % ophthalmic suspension Place 1 drop into both eyes See admin instructions. 1 drop every hour while awake    [provider]  psyllium (METAMUCIL SMOOTH TEXTURE) 58.6 % powder Take 1 packet by mouth 3 (three) times daily. 06/29/22   Elayne Snare K, DO  senna-docusate (SENOKOT-S) 8.6-50 MG tablet Take 1 tablet by mouth daily.  06/29/22   Elayne Snare K, DO  tamsulosin (FLOMAX) 0.4 MG CAPS capsule Take 0.8 mg by mouth daily.    [provider]  Tiotropium Bromide-Olodaterol 2.5-2.5 MCG/ACT AERS Inhale 2 each into the lungs every morning. 2 puffs    [provider]  traZODone (DESYREL) 50 MG tablet Take 75 mg by mouth at bedtime.    [provider]      Allergies    Demerol [meperidine], Zocor [simvastatin], Beet [beta vulgaris], Collard greens [wild lettuce extract (lactuca virosa)], and Liver    Review of Systems   Review of Systems  Neurological:  Positive for weakness.  Review of systems completed and notable as per HPI.  ROS otherwise negative.   Physical Exam Updated Vital Signs BP (!) 127/93 (BP  Location: Right Arm)   Pulse 95   Temp 98.8 F (37.1 C) (Oral)   Resp 20   Ht 6\' 2"  (1.88 m)   Wt (!) 140.6 kg   SpO2 96%   BMI 39.80 kg/m  Physical Exam Vitals and nursing note reviewed.  Constitutional:      General: He is not in acute distress.    Appearance: He is well-developed.  HENT:     Head: Normocephalic and atraumatic.     Mouth/Throat:     Mouth: Mucous membranes are dry.  Eyes:     Extraocular Movements: Extraocular movements intact.     Conjunctiva/sclera: Conjunctivae normal.     Pupils: Pupils are equal, round, and reactive to light.  Cardiovascular:     Rate and Rhythm: Normal rate and regular rhythm.     Heart sounds: No murmur heard. Pulmonary:     Effort: Pulmonary effort is normal. No respiratory distress.     Breath sounds: Normal breath sounds.  Abdominal:     Palpations: Abdomen is soft.     Tenderness: There is no abdominal tenderness. There is no guarding or rebound.  Musculoskeletal:        General: No swelling.     Cervical back: Neck supple.     Right lower leg: No edema.     Left lower leg: No edema.     Comments: Shingles rash to right lower back  Skin:    General: Skin is warm and dry.     Capillary Refill: Capillary refill  takes less than 2 seconds.  Neurological:     General: No focal deficit present.     Mental Status: He is alert and oriented to person, place, and time. Mental status is at baseline.     Cranial Nerves: No cranial nerve deficit.     Sensory: No sensory deficit.     Motor: No weakness.     Coordination: Coordination normal.     Gait: Gait normal.  Psychiatric:        Mood and Affect: Mood normal.     ED Results / Procedures / Treatments   Labs (all labs ordered are listed, but only abnormal results are displayed) Labs Reviewed  BASIC METABOLIC PANEL - Abnormal; Notable for the following components:      Result Value   Glucose, Bld 162 (*)    Calcium 8.8 (*)    All other components within normal limits  CBC - Abnormal; Notable for the following components:   RDW 15.6 (*)    All other components within normal limits  CBG MONITORING, ED - Abnormal; Notable for the following components:   Glucose-Capillary 176 (*)    All other components within normal limits  URINALYSIS, ROUTINE W REFLEX MICROSCOPIC  TROPONIN I (HIGH SENSITIVITY)    EKG EKG Interpretation Date/Time:  Sunday September 11 2022 13:54:25 EDT Ventricular Rate:  91 PR Interval:  190 QRS Duration:  115 QT Interval:  375 QTC Calculation: 462 R Axis:   -23  Text Interpretation: Sinus rhythm Nonspecific intraventricular conduction delay No significant change since last tracing Confirmed by Fulton Reek [phone]) on 09/11/2022 2:02:00 PM    Procedures Procedures    Medications Ordered in ED Medications  amoxicillin-clavulanate (AUGMENTIN) 875-125 MG per tablet 1 tablet (has no administration in time range)  lactated ringers bolus 500 mL (0 mLs Intravenous Stopped 09/11/22 1537)  ondansetron (ZOFRAN) injection 4 mg (4 mg Intravenous Given 09/11/22 1537)  oxyCODONE (Oxy IR/ROXICODONE) immediate release tablet  5 mg (5 mg Oral Given 09/11/22 1537)    ED Course/ Medical Decision Making/ A&P                              Medical Decision Making Amount and/or Complexity of Data Reviewed Labs: ordered. Radiology: ordered.  Risk Prescription drug management.   Medical Decision Making:   Mario [name] is a 72 y.o. male who presented to the ED today with fatigue, generalized weakness.  On exam he is overall well-appearing.  He looks somewhat dry but respiratory wise has no shortness of breath and is on his baseline oxygen requirement.  He did have some findings of possible atelectasis versus pneumonia yesterday, will repeat chest x-ray.  His workup yesterday was very reassuring.  Given fatigue and generalized weakness will obtain single troponin as well as EKG although low concern for ACS.  Consider possible metabolic abnormality, dehydration.  He does have shingles, no signs of superimposed infection.  He has normal neurologic exam and no focality, I have low concern for CVA.   Patient placed on continuous vitals and telemetry monitoring while in ED which was reviewed periodically.  Reviewed and confirmed nursing documentation for past medical history, family history, social history.  Initial Study Results:   Laboratory  All laboratory results reviewed.  Labs notable for troponin normal.  CBC, CMP unremarkable.  Urinalysis without signs of infection.  EKG EKG was reviewed independently. Rate, rhythm, axis, intervals all examined and without medically relevant abnormality. ST segments without concerns for elevations.  No change from EKG yesterday.  Radiology:  All images reviewed independently.  Chest x-ray reviewed, he does have atelectasis but no signs of pneumonia.  Agree with radiology report at this time.    Reassessment and Plan:   Reassessment he is stable.  Feel slightly better.  He does report some chronic pain related to shingles which he usually takes oxycodone for as well as some mild nausea but no vomiting.  Medication given.  His symptoms are nonspecific, and his work appears very reassuring  including thorough workup last night as well.  The radiology read for the chest x-ray is concerning for possible left-sided pneumonia.  He has no leukocytosis, hypoxia, or fever and is not short of breath.  However given this finding will place on course of antibiotics for possible pneumonia as this could be contributing to his symptoms.  I do not think there is any indication for hospitalization at this time.  He has help at home, he is able to ambulate without assistance.  I recommend he call his primary care doctor tomorrow for close follow-up.  He was amenable to this plan.  Given strict return precautions for new or worsening symptoms.   Patient's presentation is most consistent with acute complicated illness / injury requiring diagnostic workup.           Final Clinical Impression(s) / ED Diagnoses Final diagnoses:  Generalized weakness    Rx / DC Orders ED Discharge Orders          Ordered    amoxicillin-clavulanate (AUGMENTIN) 875-125 MG tablet  2 times daily        09/11/22 1537              Laurence Spates, MD 09/11/22 1538

## 2022-09-11 NOTE — ED Notes (Signed)
Pt teaching provided on medications that may cause drowsiness. Pt instructed not to drive or operate heavy machinery while taking the prescribed medication. Pt verbalized understanding.   Pt provided discharge instructions and prescription information. Pt was given the opportunity to ask questions and questions were answered.   

## 2022-09-11 NOTE — Discharge Instructions (Signed)
You were seen today in the ED.  Your workup here was reassuring.  Your x-ray shows a possible pneumonia as you are being sent home on antibiotics.  You should call your doctor first thing the morning to schedule close follow-up.  If you develop chest pain, difficulty breathing, severe pain or any other new concerning symptoms you should return to the ED.

## 2022-09-20 ENCOUNTER — Emergency Department (HOSPITAL_COMMUNITY)
Admission: EM | Admit: 2022-09-20 | Discharge: 2022-09-21 | Disposition: A | Discharge status: Home / Self Care | Payer: No Typology Code available for payment source | Attending: Emergency Medicine | Admitting: Emergency Medicine

## 2022-09-20 ENCOUNTER — Other Ambulatory Visit: Payer: Self-pay

## 2022-09-20 DIAGNOSIS — Z85118 Personal history of other malignant neoplasm of bronchus and lung: Secondary | ICD-10-CM | POA: Insufficient documentation

## 2022-09-20 DIAGNOSIS — Z7901 Long term (current) use of anticoagulants: Secondary | ICD-10-CM | POA: Diagnosis not present

## 2022-09-20 DIAGNOSIS — A4189 Other specified sepsis: Secondary | ICD-10-CM | POA: Diagnosis not present

## 2022-09-20 DIAGNOSIS — R0602 Shortness of breath: Secondary | ICD-10-CM | POA: Diagnosis not present

## 2022-09-20 DIAGNOSIS — E1122 Type 2 diabetes mellitus with diabetic chronic kidney disease: Secondary | ICD-10-CM | POA: Insufficient documentation

## 2022-09-20 DIAGNOSIS — J189 Pneumonia, unspecified organism: Secondary | ICD-10-CM | POA: Diagnosis not present

## 2022-09-20 DIAGNOSIS — J449 Chronic obstructive pulmonary disease, unspecified: Secondary | ICD-10-CM | POA: Insufficient documentation

## 2022-09-20 DIAGNOSIS — I129 Hypertensive chronic kidney disease with stage 1 through stage 4 chronic kidney disease, or unspecified chronic kidney disease: Secondary | ICD-10-CM | POA: Insufficient documentation

## 2022-09-20 DIAGNOSIS — Z7984 Long term (current) use of oral hypoglycemic drugs: Secondary | ICD-10-CM | POA: Diagnosis not present

## 2022-09-20 DIAGNOSIS — N1831 Chronic kidney disease, stage 3a: Secondary | ICD-10-CM | POA: Diagnosis not present

## 2022-09-20 NOTE — ED Triage Notes (Signed)
Pt BIB EMS d/t SOB with movement, CP earlier today that has now resolved. Pt reported his O2 dropped 66% on 4L [place] so he sat still until it came back up.   EMS V/S Bp 140/94 HR 96 RR 14 O2 94% on 4L [place]

## 2022-09-21 ENCOUNTER — Encounter (HOSPITAL_COMMUNITY): Payer: Self-pay | Admitting: Emergency Medicine

## 2022-09-21 ENCOUNTER — Emergency Department (HOSPITAL_COMMUNITY): Payer: Non-veteran care

## 2022-09-21 ENCOUNTER — Emergency Department (EMERGENCY_DEPARTMENT_HOSPITAL)
Admission: EM | Admit: 2022-09-21 | Discharge: 2022-09-22 | Disposition: A | Discharge status: Home / Self Care | Payer: Non-veteran care | Source: Home / Self Care | Attending: Emergency Medicine | Admitting: Emergency Medicine

## 2022-09-21 ENCOUNTER — Emergency Department (HOSPITAL_COMMUNITY): Payer: No Typology Code available for payment source

## 2022-09-21 DIAGNOSIS — J449 Chronic obstructive pulmonary disease, unspecified: Secondary | ICD-10-CM | POA: Insufficient documentation

## 2022-09-21 DIAGNOSIS — Z85118 Personal history of other malignant neoplasm of bronchus and lung: Secondary | ICD-10-CM | POA: Insufficient documentation

## 2022-09-21 DIAGNOSIS — R45851 Suicidal ideations: Secondary | ICD-10-CM | POA: Insufficient documentation

## 2022-09-21 DIAGNOSIS — R0602 Shortness of breath: Secondary | ICD-10-CM | POA: Insufficient documentation

## 2022-09-21 LAB — CBC WITH DIFFERENTIAL/PLATELET
Abs Immature Granulocytes: 0.03 10*3/uL (ref 0.00–0.07)
Abs Immature Granulocytes: 0.04 10*3/uL (ref 0.00–0.07)
Basophils Absolute: 0.1 10*3/uL (ref 0.0–0.1)
Basophils Absolute: 0.1 10*3/uL (ref 0.0–0.1)
Basophils Relative: 1 %
Basophils Relative: 1 %
Eosinophils Absolute: 0.1 10*3/uL (ref 0.0–0.5)
Eosinophils Absolute: 0.2 10*3/uL (ref 0.0–0.5)
Eosinophils Relative: 1 %
Eosinophils Relative: 3 %
HCT: 40.8 % (ref 39.0–52.0)
HCT: 41.6 % (ref 39.0–52.0)
Hemoglobin: 13.5 g/dL (ref 13.0–17.0)
Hemoglobin: 13.7 g/dL (ref 13.0–17.0)
Immature Granulocytes: 0 %
Immature Granulocytes: 0 %
Lymphocytes Relative: 15 %
Lymphocytes Relative: 22 %
Lymphs Abs: 1.6 10*3/uL (ref 0.7–4.0)
Lymphs Abs: 2 10*3/uL (ref 0.7–4.0)
MCH: 30.9 pg (ref 26.0–34.0)
MCH: 31.3 pg (ref 26.0–34.0)
MCHC: 32.9 g/dL (ref 30.0–36.0)
MCHC: 33.1 g/dL (ref 30.0–36.0)
MCV: 93.9 fL (ref 80.0–100.0)
MCV: 94.4 fL (ref 80.0–100.0)
Monocytes Absolute: 0.9 10*3/uL (ref 0.1–1.0)
Monocytes Absolute: 1.1 10*3/uL — ABNORMAL HIGH (ref 0.1–1.0)
Monocytes Relative: 12 %
Monocytes Relative: 9 %
Neutro Abs: 5.6 10*3/uL (ref 1.7–7.7)
Neutro Abs: 7.9 10*3/uL — ABNORMAL HIGH (ref 1.7–7.7)
Neutrophils Relative %: 62 %
Neutrophils Relative %: 74 %
Platelets: 293 10*3/uL (ref 150–400)
Platelets: 327 10*3/uL (ref 150–400)
RBC: 4.32 MIL/uL (ref 4.22–5.81)
RBC: 4.43 MIL/uL (ref 4.22–5.81)
RDW: 15.7 % — ABNORMAL HIGH (ref 11.5–15.5)
RDW: 15.9 % — ABNORMAL HIGH (ref 11.5–15.5)
WBC: 10.6 10*3/uL — ABNORMAL HIGH (ref 4.0–10.5)
WBC: 8.9 10*3/uL (ref 4.0–10.5)
nRBC: 0 % (ref 0.0–0.2)
nRBC: 0 % (ref 0.0–0.2)

## 2022-09-21 LAB — BRAIN NATRIURETIC PEPTIDE
B Natriuretic Peptide: 29.9 pg/mL (ref 0.0–100.0)
B Natriuretic Peptide: 42.6 pg/mL (ref 0.0–100.0)

## 2022-09-21 LAB — COMPREHENSIVE METABOLIC PANEL
ALT: 15 U/L (ref 0–44)
AST: 19 U/L (ref 15–41)
Albumin: 3.7 g/dL (ref 3.5–5.0)
Alkaline Phosphatase: 95 U/L (ref 38–126)
Anion gap: 14 (ref 5–15)
BUN: 14 mg/dL (ref 8–23)
CO2: 22 mmol/L (ref 22–32)
Calcium: 9.2 mg/dL (ref 8.9–10.3)
Chloride: 101 mmol/L (ref 98–111)
Creatinine, Ser: 0.95 mg/dL (ref 0.61–1.24)
GFR, Estimated: >60 mL/min (ref >60)
Glucose, Bld: 134 mg/dL — ABNORMAL HIGH (ref 70–99)
Potassium: 3.8 mmol/L (ref 3.5–5.1)
Sodium: 137 mmol/L (ref 135–145)
Total Bilirubin: 0.6 mg/dL (ref 0.3–1.2)
Total Protein: 7.6 g/dL (ref 6.5–8.1)

## 2022-09-21 LAB — TROPONIN I (HIGH SENSITIVITY)
Troponin I (High Sensitivity): 7 ng/L (ref <18)
Troponin I (High Sensitivity): 7 ng/L (ref <18)
Troponin I (High Sensitivity): 8 ng/L (ref <18)
Troponin I (High Sensitivity): 8 ng/L (ref <18)

## 2022-09-21 LAB — BASIC METABOLIC PANEL
Anion gap: 9 (ref 5–15)
BUN: 15 mg/dL (ref 8–23)
CO2: 25 mmol/L (ref 22–32)
Calcium: 8.9 mg/dL (ref 8.9–10.3)
Chloride: 101 mmol/L (ref 98–111)
Creatinine, Ser: 0.98 mg/dL (ref 0.61–1.24)
GFR, Estimated: >60 mL/min (ref >60)
Glucose, Bld: 109 mg/dL — ABNORMAL HIGH (ref 70–99)
Potassium: 4.5 mmol/L (ref 3.5–5.1)
Sodium: 135 mmol/L (ref 135–145)

## 2022-09-21 LAB — ETHANOL: Alcohol, Ethyl (B): <10 mg/dL (ref <10)

## 2022-09-21 MED ORDER — SODIUM CHLORIDE (PF) 0.9 % IJ SOLN
INTRAMUSCULAR | Status: AC
  Filled 2022-09-21: qty 50

## 2022-09-21 MED ORDER — ACETAMINOPHEN 500 MG PO TABS
1000.0000 mg | ORAL_TABLET | Freq: Once | ORAL | Status: AC
  Administered 2022-09-21: 1000 mg via ORAL
  Filled 2022-09-21: qty 2

## 2022-09-21 MED ORDER — IOHEXOL 350 MG/ML SOLN
100.0000 mL | Freq: Once | INTRAVENOUS | Status: AC | PRN
  Administered 2022-09-21: 100 mL via INTRAVENOUS

## 2022-09-21 NOTE — ED Notes (Addendum)
US PIV placed. 

## 2022-09-21 NOTE — ED Triage Notes (Signed)
Pt present via [facility] from home with complaint of shortness of breath and suicidal ideation. Pt reports he is suicidal due to his health condition. When asked if he has a specific plan pt stated "Well I always have muscle relaxers". Pt on 4L O2 at all timess Respirations even and unlabored at this time.

## 2022-09-21 NOTE — ED Notes (Addendum)
Pt ambulated approximately 70ft with 4L oxygen and pulse oximetry attached. Pt reported feeling short of breath about half way through the walk which persisted until completion of walk. Sp02 Stat started at 98% and dropped to 94% at the end of the walk.

## 2022-09-21 NOTE — ED Provider Notes (Signed)
[place] EMERGENCY DEPARTMENT AT [facility] Provider Note   CSN: [id number] Arrival date & time: 09/21/22  1923     History  Chief Complaint  Patient presents with   Shortness of Breath   Suicidal    Mario [name] is a 73 y.o. male.  Patient is a 72 year old male with a past medical history of stage IV lung cancer not currently on treatment, prior PEs and unclear if compliant on Eliquis, COPD on 4 L nasal cannula, diabetes and hypertension presenting to the emergency department with shortness of breath as well as suicidal ideation.  Patient reports that this morning when he woke up he had the sudden urge to urinate and when he went to the bathroom he urinated and had diarrhea at the same time.  He states that he felt very short of breath getting up to the bathroom and then throughout the day he continued to feel short of breath every time he tried to get up and exert himself.  He denied any associated chest pain.  He denied any fever or cough.  He states that he has had some mild swelling in his feet.  He states that he had a history of a blood clot years ago but reported that he is not currently taking anticoagulation.  Of note he was seen in the emergency department yesterday for shortness of breath but states that today's symptoms were different with the urgency of going to the bathroom.  Patient also reports that he has been having suicidal ideation with thoughts of overdosing on his muscle relaxer.  He states that he has not taken anything to try to hurt himself.  The history is provided by the patient.  Shortness of Breath      Home Medications Prior to Admission medications   Medication Sig Start Date End Date Taking? Authorizing Provider  albuterol (PROVENTIL) (2.5 MG/3ML) 0.083% nebulizer solution Inhale 3 mLs (2.5 mg total) by nebulization every 4 (four) hours as needed for wheezing or shortness of breath. Patient taking differently: Take 2.5 mg by  nebulization every 6 (six) hours as needed for wheezing or shortness of breath. 08/04/20 10/02/22  Rolly Salter, MD  albuterol (VENTOLIN HFA) 108 (90 Base) MCG/ACT inhaler Inhale 2 puffs into the lungs every 4 (four) hours as needed for wheezing or shortness of breath. 11/04/21   Glade Lloyd, MD  apixaban (ELIQUIS) 5 MG TABS tablet Take 5 mg by mouth 2 (two) times daily. Patient not taking: Reported on 07/02/2022    [provider]  buPROPion (WELLBUTRIN XL) 300 MG 24 hr tablet Take 300 mg by mouth every morning. 02/05/20   [provider]  busPIRone (BUSPAR) 15 MG tablet Take 15 mg by mouth daily.    [provider]  Carboxymethylcellulose Sod PF 0.5 % SOLN Place 1 drop into both eyes 4 (four) times daily as needed (dry eyes).    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    [provider]  cyclobenzaprine (FLEXERIL) 10 MG tablet Take 30 mg by mouth at bedtime.    [provider]  cycloSPORINE (RESTASIS) 0.05 % ophthalmic emulsion Place 1 drop into both eyes every 12 (twelve) hours.    [provider]  diltiazem (CARDIZEM CD) 180 MG 24 hr capsule Take 180 mg by mouth daily.    [provider]  docusate sodium (COLACE) 100 MG  capsule Take 100 mg by mouth 2 (two) times daily.    [provider]  DULoxetine (CYMBALTA) 60 MG capsule Take 1 capsule (60 mg total) by mouth 2 (two) times daily. 10/04/21 07/02/22  Earney Navy, NP  fluticasone (FLONASE) 50 MCG/ACT nasal spray Place 2 sprays into both nostrils daily.     [provider]  folic acid (FOLVITE) 1 MG tablet Take 1 mg by mouth daily.    [provider]  gabapentin (NEURONTIN) 300 MG capsule Take 1 capsule (300 mg total) by mouth 3 (three) times daily for 10 days. 09/02/22 09/12/22  Glyn Ade, MD  guaiFENesin (MUCINEX) 600 MG 12 hr tablet  Take 600 mg by mouth 2 (two) times daily as needed for cough.    [provider]  lidocaine (HM LIDOCAINE PATCH) 4 % Place 1 patch onto the skin daily. 09/02/22   Glyn Ade, MD  lisinopril (ZESTRIL) 20 MG tablet Take 20 mg by mouth daily.    [provider]  lurasidone (LATUDA) 40 MG TABS tablet Take 40 mg by mouth every evening. After supper    [provider]  Melatonin 3 MG CAPS Take 6 mg by mouth at bedtime as needed (sleep).    [provider]  metFORMIN (GLUCOPHAGE) 850 MG tablet Take 850 mg by mouth daily with breakfast.    [provider]  methocarbamol (ROBAXIN) 750 MG tablet Take 1 tablet (750 mg total) by mouth every 6 (six) hours as needed for muscle spasms. 07/04/22   Burnadette Pop, MD  methotrexate (RHEUMATREX) 2.5 MG tablet Take 15 mg by mouth once a week. Caution:Chemotherapy. Protect from light. Tuesdays    [provider]  Mometasone Furoate 200 MCG/ACT AERO Inhale 2 puffs into the lungs 2 (two) times daily. Patient not taking: Reported on 07/02/2022 11/04/21   Glade Lloyd, MD  mycophenolate (CELLCEPT) 250 MG capsule Take 1,500 mg by mouth 2 (two) times daily.    [provider]  Nutritional Supplements (ENSURE ORIGINAL) LIQD Take 1 Bottle by mouth daily as needed (in between meals).    [provider]  ondansetron (ZOFRAN-ODT) 4 MG disintegrating tablet Take 1 tablet (4 mg total) by mouth every 8 (eight) hours as needed for nausea or vomiting. 10/04/21   Earney Navy, NP  oxcarbazepine (TRILEPTAL) 600 MG tablet Take 600 mg by mouth See admin instructions. Take 1 tablet BID and at bedtime    [provider]  oxyCODONE (OXY IR/ROXICODONE) 5 MG immediate release tablet Take 1-2 tablets (5-10 mg total) by mouth every 4 (four) hours as needed for severe pain. 09/10/22   Smitty Knudsen, PA-C  pantoprazole (PROTONIX) 40 MG tablet Take 40 mg by mouth daily. Take on an empty stomach    [provider]  polyethylene glycol powder (GLYCOLAX/MIRALAX) 17 GM/SCOOP powder Take 17 g by mouth daily.    [provider]  prednisoLONE acetate (PRED FORTE) 1 % ophthalmic suspension Place 1 drop into both eyes See admin instructions. 1 drop every hour while awake    [provider]  psyllium (METAMUCIL SMOOTH TEXTURE) 58.6 % powder Take 1 packet by mouth 3 (three) times daily. 06/29/22   Elayne Snare K, DO  senna-docusate (SENOKOT-S) 8.6-50 MG tablet Take 1 tablet by mouth daily. 06/29/22   Elayne Snare K, DO  tamsulosin (FLOMAX) 0.4 MG CAPS capsule Take 0.8 mg by mouth daily.    [provider]  Tiotropium Bromide-Olodaterol 2.5-2.5 MCG/ACT AERS Inhale 2 each  into the lungs every morning. 2 puffs    [provider]  traZODone (DESYREL) 50 MG tablet Take 75 mg by mouth at bedtime.    [provider]      Allergies    Demerol [meperidine], Zocor [simvastatin], Beet [beta vulgaris], Collard greens [wild lettuce extract (lactuca virosa)], and Liver    Review of Systems   Review of Systems  Respiratory:  Positive for shortness of breath.     Physical Exam Updated Vital Signs BP (!) 148/84 (BP Location: Right Arm)   Pulse 98   Temp 97.9 F (36.6 C) (Oral)   Resp 20  Physical Exam Vitals and nursing note reviewed.  Constitutional:      General: He is not in acute distress.    Appearance: He is well-developed.  HENT:     Head: Normocephalic and atraumatic.     Mouth/Throat:     Mouth: Mucous membranes are moist.  Eyes:     Extraocular Movements: Extraocular movements intact.  Cardiovascular:     Rate and Rhythm: Normal rate and regular rhythm.  Pulmonary:     Effort: Pulmonary effort is normal.     Breath sounds: Normal breath sounds.  Abdominal:     Palpations: Abdomen is soft.     Tenderness: There is no abdominal tenderness.  Musculoskeletal:        General: Normal range of motion.     Cervical back: Normal range of  motion and neck supple.     Right lower leg: No edema.     Left lower leg: No edema.  Skin:    General: Skin is warm and dry.  Neurological:     General: No focal deficit present.     Mental Status: He is alert and oriented to person, place, and time.  Psychiatric:        Mood and Affect: Mood normal.        Behavior: Behavior normal.     ED Results / Procedures / Treatments   Labs (all labs ordered are listed, but only abnormal results are displayed) Labs Reviewed  COMPREHENSIVE METABOLIC PANEL - Abnormal; Notable for the following components:      Result Value   Glucose, Bld 134 (*)    All other components within normal limits  CBC WITH DIFFERENTIAL/PLATELET - Abnormal; Notable for the following components:   WBC 10.6 (*)    RDW 15.7 (*)    Neutro Abs 7.9 (*)    All other components within normal limits  ETHANOL  BRAIN NATRIURETIC PEPTIDE  RAPID URINE DRUG SCREEN, HOSP PERFORMED  URINALYSIS, W/ REFLEX TO CULTURE (INFECTION SUSPECTED)  TROPONIN I (HIGH SENSITIVITY)  TROPONIN I (HIGH SENSITIVITY)    EKG None  Radiology DG Chest 2 View  Result Date: 09/21/2022 CLINICAL DATA:  Shortness of breath EXAM: CHEST - 2 VIEW COMPARISON:  Chest x-ray 09/21/2022 FINDINGS: There is platelike opacity in the right lung base, unchanged. Small left pleural effusion and left basilar infiltrates are stable. No pneumothorax. Cardiomediastinal silhouette is unchanged. IMPRESSION: No significant interval change in the appearance of the chest. Small left pleural effusion and left basilar infiltrates. Electronically Signed   By: Darliss Cheney M.D.   On: 09/21/2022 21:13   CT Angio Chest PE W and/or Wo Contrast  Result Date: 09/21/2022 CLINICAL DATA:  Concern for pulmonary embolism. EXAM: CT ANGIOGRAPHY CHEST WITH CONTRAST TECHNIQUE: Multidetector CT imaging of the chest was performed using the standard protocol during bolus administration of intravenous contrast. Multiplanar  CT image  reconstructions and MIPs were obtained to evaluate the vascular anatomy. RADIATION DOSE REDUCTION: This exam was performed according to the departmental dose-optimization program which includes automated exposure control, adjustment of the mA and/or kV according to patient size and/or use of iterative reconstruction technique. CONTRAST:  OMNIPAQUE IOHEXOL 350 MG/ML SOLN COMPARISON:  CT dated 07/02/2022. FINDINGS: Evaluation of this exam is limited due to respiratory motion artifact. Cardiovascular: There is no cardiomegaly or pericardial effusion. Three-vessel coronary vascular calcification. Mild atherosclerotic calcification of the thoracic aorta. No aneurysmal dilatation or dissection. The origins of the great vessels of the aortic arch are patent. Evaluation of the pulmonary arteries is limited due to respiratory motion. No pulmonary artery embolus identified. Mediastinum/Nodes: No hilar or mediastinal adenopathy. The esophagus is grossly unremarkable. No mediastinal fluid collection. Lungs/Pleura: Areas of consolidation involving the lower lobes similar to prior CT. There is background of emphysema. Small loculated left pleural effusion similar to prior CT. No pneumothorax. The central airways are patent. Upper Abdomen: No acute findings. Musculoskeletal: Osteopenia with degenerative changes of the spine. Old left rib fractures. No acute osseous pathology. Review of the MIP images confirms the above findings. IMPRESSION: 1. No CT evidence of pulmonary embolism. 2. Areas of consolidation involving the lower lobes similar to prior CT. 3. Small loculated left pleural effusion similar to prior CT. 4. Aortic Atherosclerosis (ICD10-I70.0) and Emphysema (ICD10-J43.9). Electronically Signed   By: Elgie Collard M.D.   On: 09/21/2022 02:22   DG Chest Port 1 View  Result Date: 09/21/2022 CLINICAL DATA:  Shortness of breath and chest pain EXAM: PORTABLE CHEST 1 VIEW COMPARISON:  09/11/2022 FINDINGS: Cardiac  shadow is enlarged in size. Left-sided pleural effusion is again identified with likely underlying atelectatic changes. Right basilar airspace opacity is again seen. No new focal abnormality is noted. IMPRESSION: Stable appearance when compare with the prior exam. Left-sided effusion and bibasilar airspace opacity is seen. Electronically Signed   By: Alcide Clever M.D.   On: 09/21/2022 00:31    Procedures Procedures    Medications Ordered in ED Medications - No data to display  ED Course/ Medical Decision Making/ A&P Clinical Course as of 09/21/22 2305  Wed Sep 21, 2022  2304 Labs within normal range, CXR without acute disease. He is medically clear for psych eval. [VK]    Clinical Course User Index [VK] Rexford Maus, DO                             Medical Decision Making This patient presents to the ED with chief complaint(s) of SOB, SI with pertinent past medical history of COPD, stage IV lung cancer, A-fib which further complicates the presenting complaint. The complaint involves an extensive differential diagnosis and also carries with it a high risk of complications and morbidity.    The differential diagnosis includes arrhythmia, anemia, pneumonia, pneumothorax, pulmonary edema, pleural effusion, ACS, CHF, did have negative CT PE study yesterday making PE unlikely  Additional history obtained: Additional history obtained from N/A Records reviewed Care Everywhere/External Records  ED Course and Reassessment: On patient's arrival he is hemodynamically stable in no acute distress satting well on his home nasal cannula.  Patient had EKG on arrival that showed normal sinus rhythm without acute ischemic changes.  Patient will repeat labs including troponin and BNP as well as chest x-ray.  And normal CT PE study yesterday making PE unlikely.  Patient was placed on one-to-one for  safety observation.  He is agreeable to be voluntary for psychiatric evaluation today.  Independent  labs interpretation:  The following labs were independently interpreted: Within normal range  Independent visualization of imaging: - I independently visualized the following imaging with scope of interpretation limited to determining acute life threatening conditions related to emergency care: CXR, which revealed Stable left lower lobe pleural effusion  Consultation: - Consulted or discussed management/test interpretation w/ external professional: TTS    Amount and/or Complexity of Data Reviewed Labs: ordered. Radiology: ordered.          Final Clinical Impression(s) / ED Diagnoses Final diagnoses:  Suicidal ideation  Shortness of breath    Rx / DC Orders ED Discharge Orders     None         Rexford Maus, DO 09/21/22 2305

## 2022-09-21 NOTE — ED Notes (Signed)
Patient transported to CT 

## 2022-09-21 NOTE — ED Provider Notes (Signed)
[place] EMERGENCY DEPARTMENT AT [facility] Provider Note   CSN: [id number] Arrival date & time: 09/20/22  2314     History  Chief Complaint  Patient presents with   Shortness of Breath    Mario [name] is a 72 y.o. male.  Patient presents to the emergency room via EMS complaining of shortness of breath, worse with movement.  Patient also endorses an episode of chest pain that occurred earlier today which resolved on its own.  He states that with activity his oxygen saturation dropped to 66% on his baseline 4 L.  Currently the patient is maintaining oxygen saturations of 94+ percent on his baseline 4 L/min of oxygen and complains only of shortness of breath with exertion.  He currently denies chest pain, abdominal pain, weakness, lightheadedness, headache.  Past medical history significant for hypertension, type II DM, stage IIIa CKD, hypoxia, OSA, COPD, non-small cell lung cancer, left-sided, left-sided pleural effusion  HPI     Home Medications Prior to Admission medications   Medication Sig Start Date End Date Taking? Authorizing Provider  albuterol (PROVENTIL) (2.5 MG/3ML) 0.083% nebulizer solution Inhale 3 mLs (2.5 mg total) by nebulization every 4 (four) hours as needed for wheezing or shortness of breath. Patient taking differently: Take 2.5 mg by nebulization every 6 (six) hours as needed for wheezing or shortness of breath. 08/04/20 10/02/22  Rolly Salter, MD  albuterol (VENTOLIN HFA) 108 (90 Base) MCG/ACT inhaler Inhale 2 puffs into the lungs every 4 (four) hours as needed for wheezing or shortness of breath. 11/04/21   Glade Lloyd, MD  apixaban (ELIQUIS) 5 MG TABS tablet Take 5 mg by mouth 2 (two) times daily. Patient not taking: Reported on 07/02/2022    [provider]  buPROPion (WELLBUTRIN XL) 300 MG 24 hr tablet Take 300 mg by mouth every morning. 02/05/20   [provider]  busPIRone (BUSPAR) 15 MG tablet Take 15 mg by mouth daily.     [provider]  Carboxymethylcellulose Sod PF 0.5 % SOLN Place 1 drop into both eyes 4 (four) times daily as needed (dry eyes).    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    [provider]  cyclobenzaprine (FLEXERIL) 10 MG tablet Take 30 mg by mouth at bedtime.    [provider]  cycloSPORINE (RESTASIS) 0.05 % ophthalmic emulsion Place 1 drop into both eyes every 12 (twelve) hours.    [provider]  diltiazem (CARDIZEM CD) 180 MG 24 hr capsule Take 180 mg by mouth daily.    [provider]  docusate sodium (COLACE) 100 MG capsule Take 100 mg by mouth 2 (two) times daily.    [provider]  DULoxetine (CYMBALTA) 60 MG capsule Take 1 capsule (60 mg total) by mouth 2 (two) times daily. 10/04/21 07/02/22  Earney Navy, NP  fluticasone (FLONASE) 50 MCG/ACT nasal spray Place 2 sprays into both nostrils daily.     [provider]  folic acid (FOLVITE) 1 MG tablet Take 1 mg by mouth daily.    [provider]  gabapentin (NEURONTIN) 300 MG capsule Take 1 capsule (300 mg total) by mouth 3 (three) times daily for 10 days. 09/02/22 09/12/22  Glyn Ade, MD  guaiFENesin (MUCINEX) 600 MG 12 hr tablet Take 600 mg by mouth 2 (two) times daily as needed for cough.    [provider]  lidocaine (HM LIDOCAINE PATCH) 4 % Place 1 patch onto the skin daily. 09/02/22   Glyn Ade, MD  lisinopril (ZESTRIL) 20 MG tablet Take 20 mg by mouth daily.    [provider]  lurasidone (LATUDA) 40 MG TABS tablet Take 40 mg by mouth every evening. After supper    [provider]  Melatonin 3 MG CAPS Take 6 mg by mouth at bedtime as needed (sleep).    [provider]  metFORMIN (GLUCOPHAGE) 850 MG tablet Take 850 mg by mouth daily with breakfast.    [provider]  methocarbamol  (ROBAXIN) 750 MG tablet Take 1 tablet (750 mg total) by mouth every 6 (six) hours as needed for muscle spasms. 07/04/22   Burnadette Pop, MD  methotrexate (RHEUMATREX) 2.5 MG tablet Take 15 mg by mouth once a week. Caution:Chemotherapy. Protect from light. Tuesdays    [provider]  Mometasone Furoate 200 MCG/ACT AERO Inhale 2 puffs into the lungs 2 (two) times daily. Patient not taking: Reported on 07/02/2022 11/04/21   Glade Lloyd, MD  mycophenolate (CELLCEPT) 250 MG capsule Take 1,500 mg by mouth 2 (two) times daily.    [provider]  Nutritional Supplements (ENSURE ORIGINAL) LIQD Take 1 Bottle by mouth daily as needed (in between meals).    [provider]  ondansetron (ZOFRAN-ODT) 4 MG disintegrating tablet Take 1 tablet (4 mg total) by mouth every 8 (eight) hours as needed for nausea or vomiting. 10/04/21   Earney Navy, NP  oxcarbazepine (TRILEPTAL) 600 MG tablet Take 600 mg by mouth See admin instructions. Take 1 tablet BID and at bedtime    [provider]  oxyCODONE (OXY IR/ROXICODONE) 5 MG immediate release tablet Take 1-2 tablets (5-10 mg total) by mouth every 4 (four) hours as needed for severe pain. 09/10/22   Smitty Knudsen, PA-C  pantoprazole (PROTONIX) 40 MG tablet Take 40 mg by mouth daily. Take on an empty stomach    [provider]  polyethylene glycol powder (GLYCOLAX/MIRALAX) 17 GM/SCOOP powder Take 17 g by mouth daily.    [provider]  prednisoLONE acetate (PRED FORTE) 1 % ophthalmic suspension Place 1 drop into both eyes See admin instructions. 1 drop every hour while awake    [provider]  psyllium (METAMUCIL SMOOTH TEXTURE) 58.6 % powder Take 1 packet by mouth 3 (three) times daily. 06/29/22   Elayne Snare K, DO  senna-docusate (SENOKOT-S) 8.6-50 MG tablet Take 1 tablet by mouth daily. 06/29/22   Elayne Snare K, DO  tamsulosin (FLOMAX) 0.4 MG CAPS capsule Take 0.8 mg by mouth daily.     [provider]  Tiotropium Bromide-Olodaterol 2.5-2.5 MCG/ACT AERS Inhale 2 each into the lungs every morning. 2 puffs    [provider]  traZODone (DESYREL) 50 MG tablet Take 75 mg by mouth at bedtime.    [provider]      Allergies    Demerol [meperidine], Zocor [simvastatin], Beet [beta vulgaris], Collard greens [wild lettuce extract (lactuca virosa)], and Liver    Review of Systems   Review of Systems  Physical Exam Updated Vital Signs BP (!) 157/89   Pulse 89   Temp 97.6 F (36.4 C) (Oral)   Resp 20   Ht 6\' 2"  (1.88 m)   Wt (!) 137.4 kg   SpO2 95%   BMI 38.90 kg/m  Physical Exam Vitals and nursing note reviewed.  Constitutional:      General: He is not  in acute distress.    Appearance: He is well-developed.  HENT:     Head: Normocephalic and atraumatic.  Eyes:     Conjunctiva/sclera: Conjunctivae normal.  Cardiovascular:     Rate and Rhythm: Normal rate and regular rhythm.     Heart sounds: No murmur heard. Pulmonary:     Effort: Pulmonary effort is normal. No respiratory distress.     Breath sounds: Examination of the right-lower field reveals decreased breath sounds. Decreased breath sounds present.  Abdominal:     Palpations: Abdomen is soft.     Tenderness: There is no abdominal tenderness.  Musculoskeletal:        General: No swelling.     Cervical back: Neck supple.     Right lower leg: No edema.     Left lower leg: No edema.  Skin:    General: Skin is warm and dry.     Capillary Refill: Capillary refill takes less than 2 seconds.  Neurological:     Mental Status: He is alert.  Psychiatric:        Mood and Affect: Mood normal.     ED Results / Procedures / Treatments   Labs (all labs ordered are listed, but only abnormal results are displayed) Labs Reviewed  BASIC METABOLIC PANEL - Abnormal; Notable for the following components:      Result Value   Glucose, Bld 109 (*)    All other components within normal  limits  CBC WITH DIFFERENTIAL/PLATELET - Abnormal; Notable for the following components:   RDW 15.9 (*)    Monocytes Absolute 1.1 (*)    All other components within normal limits  BRAIN NATRIURETIC PEPTIDE  TROPONIN I (HIGH SENSITIVITY)  TROPONIN I (HIGH SENSITIVITY)    EKG None  Radiology CT Angio Chest PE W and/or Wo Contrast  Result Date: 09/21/2022 CLINICAL DATA:  Concern for pulmonary embolism. EXAM: CT ANGIOGRAPHY CHEST WITH CONTRAST TECHNIQUE: Multidetector CT imaging of the chest was performed using the standard protocol during bolus administration of intravenous contrast. Multiplanar CT image reconstructions and MIPs were obtained to evaluate the vascular anatomy. RADIATION DOSE REDUCTION: This exam was performed according to the departmental dose-optimization program which includes automated exposure control, adjustment of the mA and/or kV according to patient size and/or use of iterative reconstruction technique. CONTRAST:  OMNIPAQUE IOHEXOL 350 MG/ML SOLN COMPARISON:  CT dated 07/02/2022. FINDINGS: Evaluation of this exam is limited due to respiratory motion artifact. Cardiovascular: There is no cardiomegaly or pericardial effusion. Three-vessel coronary vascular calcification. Mild atherosclerotic calcification of the thoracic aorta. No aneurysmal dilatation or dissection. The origins of the great vessels of the aortic arch are patent. Evaluation of the pulmonary arteries is limited due to respiratory motion. No pulmonary artery embolus identified. Mediastinum/Nodes: No hilar or mediastinal adenopathy. The esophagus is grossly unremarkable. No mediastinal fluid collection. Lungs/Pleura: Areas of consolidation involving the lower lobes similar to prior CT. There is background of emphysema. Small loculated left pleural effusion similar to prior CT. No pneumothorax. The central airways are patent. Upper Abdomen: No acute findings. Musculoskeletal: Osteopenia with degenerative changes  of the spine. Old left rib fractures. No acute osseous pathology. Review of the MIP images confirms the above findings. IMPRESSION: 1. No CT evidence of pulmonary embolism. 2. Areas of consolidation involving the lower lobes similar to prior CT. 3. Small loculated left pleural effusion similar to prior CT. 4. Aortic Atherosclerosis (ICD10-I70.0) and Emphysema (ICD10-J43.9). Electronically Signed   By: Elgie Collard M.D.   On:  09/21/2022 02:22   DG Chest Port 1 View  Result Date: 09/21/2022 CLINICAL DATA:  Shortness of breath and chest pain EXAM: PORTABLE CHEST 1 VIEW COMPARISON:  09/11/2022 FINDINGS: Cardiac shadow is enlarged in size. Left-sided pleural effusion is again identified with likely underlying atelectatic changes. Right basilar airspace opacity is again seen. No new focal abnormality is noted. IMPRESSION: Stable appearance when compare with the prior exam. Left-sided effusion and bibasilar airspace opacity is seen. Electronically Signed   By: Alcide Clever M.D.   On: 09/21/2022 00:31    Procedures Procedures    Medications Ordered in ED Medications  iohexol (OMNIPAQUE) 350 MG/ML injection 100 mL (100 mLs Intravenous Contrast Given 09/21/22 0147)  acetaminophen (TYLENOL) tablet 1,000 mg (1,000 mg Oral Given 09/21/22 0255)    ED Course/ Medical Decision Making/ A&P                             Medical Decision Making Amount and/or Complexity of Data Reviewed Labs: ordered. Radiology: ordered.  Risk OTC drugs. Prescription drug management.   This patient presents to the ED for concern of shortness of breath, this involves an extensive number of treatment options, and is a complaint that carries with it a high risk of complications and morbidity.  The differential diagnosis includes COPD exacerbation, acute heart failure, pulmonary embolism, ACS, others   Co morbidities that complicate the patient evaluation  COPD, non-small cell lung cancer   Additional history  obtained:  Additional history obtained from EMS External records from outside source obtained and reviewed including [facility] summarizing [facility] conditions   Lab Tests:  I Ordered, and personally interpreted labs.  The pertinent results include: Unremarkable CBC, BMP.  BNP 29.9.  Initial troponin 7.   Imaging Studies ordered:  I ordered imaging studies including chest x-ray, CT angio PE study I independently visualized and interpreted imaging which showed  1. No CT evidence of pulmonary embolism.  2. Areas of consolidation involving the lower lobes similar to prior  CT.  3. Small loculated left pleural effusion similar to prior CT.  4. Aortic Atherosclerosis (ICD10-I70.0) and Emphysema    Stable appearance when compare with the prior exam. Left-sided  effusion and bibasilar airspace opacity is seen.   I agree with the radiologist interpretation   Cardiac Monitoring: / EKG:  The patient was maintained on a cardiac monitor.  I personally viewed and interpreted the cardiac monitored which showed an underlying rhythm of: Sinus rhythm with a left bundle branch block   Problem List / ED Course / Critical interventions / Medication management   I ordered medication including Tylenol for muscle pain Reevaluation of the patient after these medicines showed that the patient improved I have reviewed the patients home medicines and have made adjustments as needed   Social Determinants of Health:  Patient gets majority of health care through the [facility]   Test / Admission - Considered:  Patient was able to ambulate without significant oxygen saturation decreased.  He maintained saturations of 90+ percent.  No acute findings on imaging today.  Imaging appears to be consistent with previous scans and x-rays.  Lab work grossly unremarkable showing no signs of ACS or fluid overload.  Unclear as to what caused patient's reported desaturation at home.  No desaturation was  noted by EMS or here in the emergency department.  Plan to have patient follow-up with his pulmonologist for further evaluation and management as needed.  Return precautions provided.         Final Clinical Impression(s) / ED Diagnoses Final diagnoses:  SOB (shortness of breath)    Rx / DC Orders ED Discharge Orders     None         Pamala Duffel 09/21/22 0347    Glynn Octave, MD 09/21/22 [phone]

## 2022-09-21 NOTE — ED Notes (Signed)
Pt has been dressed out into a gown, Pt bag containing phone has been placed in belongings bag with stickers alongside his watch, and t shirt.

## 2022-09-21 NOTE — Discharge Instructions (Signed)
You were evaluated today for shortness of breath.  Your workup was reassuring with no signs of acute emergent condition on CT scan, x-ray, or lab work.  It is important that you follow-up with your pulmonologist for further evaluation of your shortness of breath.  If you develop any life-threatening symptoms please return to the emergency department.

## 2022-09-22 ENCOUNTER — Telehealth: Payer: Self-pay

## 2022-09-22 DIAGNOSIS — F4321 Adjustment disorder with depressed mood: Secondary | ICD-10-CM

## 2022-09-22 DIAGNOSIS — F332 Major depressive disorder, recurrent severe without psychotic features: Secondary | ICD-10-CM

## 2022-09-22 LAB — URINALYSIS, W/ REFLEX TO CULTURE (INFECTION SUSPECTED)
Bacteria, UA: NONE SEEN
Bilirubin Urine: NEGATIVE
Glucose, UA: NEGATIVE mg/dL
Hgb urine dipstick: NEGATIVE
Ketones, ur: 5 mg/dL — AB
Leukocytes,Ua: NEGATIVE
Nitrite: NEGATIVE
Protein, ur: NEGATIVE mg/dL
Specific Gravity, Urine: 1.025 (ref 1.005–1.030)
pH: 5 (ref 5.0–8.0)

## 2022-09-22 LAB — RAPID URINE DRUG SCREEN, HOSP PERFORMED
Amphetamines: NOT DETECTED
Barbiturates: NOT DETECTED
Benzodiazepines: NOT DETECTED
Cocaine: NOT DETECTED
Opiates: NOT DETECTED
Tetrahydrocannabinol: NOT DETECTED

## 2022-09-22 MED ORDER — OXYCODONE HCL 5 MG PO TABS
5.0000 mg | ORAL_TABLET | Freq: Once | ORAL | Status: AC
  Administered 2022-09-22: 5 mg via ORAL
  Filled 2022-09-22: qty 1

## 2022-09-22 NOTE — ED Provider Notes (Signed)
I assumed care of this patient from previous provider.  Please see their note for further details of history, exam, and MDM.   Briefly patient is a 72 y.o. male who presented here with SOB and pain related to known cancer, also having SI. Medically cleared, pending [facility] evaluation.  Patient cleared by [facility]. Will consult TOC for assistance expanding home health.  The patient appears reasonably screened and/or stabilized for discharge and I doubt any other medical condition or other [facility] requiring further screening, evaluation, or treatment in the ED at this time. I have discussed the findings, Dx and Tx plan with the patient/family who expressed understanding and agree(s) with the plan. Discharge instructions discussed at length. The patient/family was given strict return precautions who verbalized understanding of the instructions. No further questions at time of discharge.  Disposition: Discharge  Condition: Good  ED Discharge Orders     None        Follow Up: Clinic, [name] [address] [facility] [place] [address] [phone]  Call  to schedule an appointment for close follow up          Nira Conn, MD 09/22/22 [phone]

## 2022-09-22 NOTE — BH Assessment (Addendum)
This clinician contacted [facility] for this patient.  Dahlia Client , coordinator took information.  [facility] coordinators were added to the secure messaging.

## 2022-09-22 NOTE — Telephone Encounter (Signed)
Received inbound call from [place] SW Latanya Presser [phone] ext [phone], who advised [facility] services will need be set up via the discharge items form. [facility] SW faxed [facility] to this RNCM.  This RNCM will email Urgent items for discharge form to: [email or web address] to initiate HHPT/OT/RN, SW.  No additional TOC needs at this time.

## 2022-09-22 NOTE — Discharge Instructions (Signed)
Our social workers will contact you at home to assist with expanding home health.

## 2022-09-22 NOTE — Telephone Encounter (Signed)
Per chart review patient has been discharged from [facility] ED prior to this RNCM's shift. Patient has HH orders for HHPT/OT, RN, HHA, SW. This RNCM spoke with [facility] trasfer coordinated who provided the following [facility] info: PCP is Selena Batten, SW: [place], [phone] ext [phone], BH SW: Darlyn Read [phone]. Left voicemail for [facility] SW, awaiting a call back.

## 2022-09-22 NOTE — Consult Note (Signed)
[facility] Consult Note  Patient Name: Mario [name] MRN: [id number] DOB: [birth year] DATE OF Consult: 09/22/2022  PRIMARY PSYCHIATRIC DIAGNOSES  1.  Major depressive disorder 2.  Adjustment disorder with depressed mood   RECOMMENDATIONS  Recommendations: Medication recommendations: Continue medications as prescribed Non-Medication/therapeutic recommendations: per hospital policy Communication: Treatment team members (and family members if applicable) who were involved in treatment/care discussions and planning, and with whom we spoke or engaged with via secure text/chat, include the following: Treatment team  Thank you for involving [place] in the care of this patient. If you have any additional questions or concerns, please call [phone] and ask for me or the provider on-call.  TELEPSYCHIATRY ATTESTATION & CONSENT  As the provider for this telehealth consult, I attest that I verified the patient's identity using two separate identifiers, introduced myself to the patient, provided my credentials, disclosed my location, and performed this encounter via a HIPAA-compliant, real-time, face-to-face, two-way, interactive audio and video platform and with the full consent and agreement of the patient (or guardian as applicable.)  Patient physical location: [facility] Tele[facility] provider physical location: home office in state of [place].  Video start time: 0108 (Central Time) Video end time: 0135 (Central Time)  IDENTIFYING DATA  Mario [name] is a 72 y.o. year-old male for whom a psychiatric consultation has been ordered by the primary provider. The patient was identified using two separate identifiers.  CHIEF COMPLAINT/REASON FOR CONSULT  Breathing issues that requires continuous use of oxygen Stage 4 lung cancer Hard to catch breath Sleep too much Depression   HISTORY OF PRESENT ILLNESS (HPI)  The patient is 71 year old male with history HTN, diabetes, depression, COPD,  OSA, stage 4 lung cancer and other multiple health issues.  During evaluation, patient reports that he is having breathing issues, which requires him to use oxygen most of the time. He talked about his multiple health issues especially his stage 4 lung cancer. He reports feeling depressed due to his health issues and has had thoughts of overdosing on his medications yesterday but reports it something he knows he won't do, though he reports a history of suicide attempt about 10 years ago and did get admitted to an inpatient mental health treatment facility at that time. In the past couple of days, the patient has experienced increased difficulty in catching his breath. Additionally, the patient reports frequent bowel movements, occurring approximately eight times a day, but denies having diarrhea.  The patient expresses feelings of depression and sometimes feeling hopelessness. The patient reports a history of auditory hallucinations in the past but cannot recall the last time he experienced them. He denies paranoia or belief that others are trying to harm them. The patient states that his sleep pattern is excessive, sleeping 10-12 hours per night. He reports it will be helpful to him to get a therapist he could talk to in-person or virtually to him process what he is going through as his doctors have told him there isnt much more they can do for him. He also reports that it would be helpful if he gets more home health aide time as he only has 9 hours a week on Tuesdays and Thursdays from 2pm to 630pm. He is open to the idea of assisted living but doesn't think he can afford it. He has a daughter who lives in [place] and wants to plan a visit to see her to see if he can move there. He denies current suicidal and homicidal ideations. He denies  having any firearms at home. He reports feeling safe to go home but does not believe his current living environment is conducive for him anymore. At this point, patient  does not meet criteria for inpatient admission. He will however benefit from frequent intensive therapy and case management services.  PAST PSYCHIATRIC HISTORY   Otherwise as per HPI above.  PAST MEDICAL HISTORY  Past Medical History:  Diagnosis Date   Anxiety    Bronchitis    COPD (chronic obstructive pulmonary disease) (HCC)    Depression    History of radiation therapy    Left lung- 10/05/20-10/15/20- Dr. Antony Blackbird   Hypertension    lung ca 09/2020   MI (myocardial infarction) [facility])    ????   On home oxygen therapy    4L/min [place]   OSA (obstructive sleep apnea)    Suicide attempt (HCC)    Tension pneumothorax 06/27/2016   Uveitis      HOME MEDICATIONS  Facility Ordered Medications  Medication   [COMPLETED] iohexol (OMNIPAQUE) 350 MG/ML injection 100 mL   [COMPLETED] acetaminophen (TYLENOL) tablet 1,000 mg   PTA Medications  Medication Sig   mycophenolate (CELLCEPT) 250 MG capsule Take 1,500 mg by mouth 2 (two) times daily.   busPIRone (BUSPAR) 15 MG tablet Take 15 mg by mouth daily.   diltiazem (CARDIZEM CD) 180 MG 24 hr capsule Take 180 mg by mouth daily.   fluticasone (FLONASE) 50 MCG/ACT nasal spray Place 2 sprays into both nostrils daily.    buPROPion (WELLBUTRIN XL) 300 MG 24 hr tablet Take 300 mg by mouth every morning.   albuterol (PROVENTIL) (2.5 MG/3ML) 0.083% nebulizer solution Inhale 3 mLs (2.5 mg total) by nebulization every 4 (four) hours as needed for wheezing or shortness of breath.   apixaban (ELIQUIS) 5 MG TABS tablet Take 5 mg by mouth 2 (two) times daily.   Tiotropium Bromide-Olodaterol 2.5-2.5 MCG/ACT AERS Inhale 2 each into the lungs every morning. 2 puffs   cyclobenzaprine (FLEXERIL) 10 MG tablet Take 30 mg by mouth at bedtime.   docusate sodium (COLACE) 100 MG capsule Take 100 mg by mouth 2 (two) times daily.   DULoxetine (CYMBALTA) 60 MG capsule Take 1 capsule (60 mg total) by mouth 2 (two) times daily.   ondansetron (ZOFRAN-ODT) 4 MG  disintegrating tablet Take 1 tablet (4 mg total) by mouth every 8 (eight) hours as needed for nausea or vomiting.   Carboxymethylcellulose Sod PF 0.5 % SOLN Place 1 drop into both eyes 4 (four) times daily as needed (dry eyes).   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   cycloSPORINE (RESTASIS) 0.05 % ophthalmic emulsion Place 1 drop into both eyes every 12 (twelve) hours.   folic acid (FOLVITE) 1 MG tablet Take 1 mg by mouth daily.   guaiFENesin (MUCINEX) 600 MG 12 hr tablet Take 600 mg by mouth 2 (two) times daily as needed for cough.   lisinopril (ZESTRIL) 20 MG tablet Take 20 mg by mouth daily.   lurasidone (LATUDA) 40 MG TABS tablet Take 40 mg by mouth every evening. After supper   Melatonin 3 MG CAPS Take 6 mg by mouth at bedtime as needed (sleep).   metFORMIN (GLUCOPHAGE) 850 MG tablet Take 850 mg by mouth daily with breakfast.   methotrexate (RHEUMATREX) 2.5 MG tablet Take 15 mg by mouth once a week. Caution:Chemotherapy. Protect from light. Tuesdays   Nutritional Supplements (ENSURE ORIGINAL) LIQD  Take 1 Bottle by mouth daily as needed (in between meals).   polyethylene glycol powder (GLYCOLAX/MIRALAX) 17 GM/SCOOP powder Take 17 g by mouth daily.   tamsulosin (FLOMAX) 0.4 MG CAPS capsule Take 0.8 mg by mouth daily.   traZODone (DESYREL) 50 MG tablet Take 75 mg by mouth at bedtime.   oxcarbazepine (TRILEPTAL) 600 MG tablet Take 600 mg by mouth See admin instructions. Take 1 tablet BID and at bedtime   albuterol (VENTOLIN HFA) 108 (90 Base) MCG/ACT inhaler Inhale 2 puffs into the lungs every 4 (four) hours as needed for wheezing or shortness of breath.   Mometasone Furoate 200 MCG/ACT AERO Inhale 2 puffs into the lungs 2 (two) times daily.   psyllium (METAMUCIL SMOOTH TEXTURE) 58.6 % powder Take 1 packet by mouth 3 (three) times daily.   senna-docusate (SENOKOT-S) 8.6-50 MG tablet Take 1 tablet by  mouth daily.   prednisoLONE acetate (PRED FORTE) 1 % ophthalmic suspension Place 1 drop into both eyes See admin instructions. 1 drop every hour while awake   pantoprazole (PROTONIX) 40 MG tablet Take 40 mg by mouth daily. Take on an empty stomach   methocarbamol (ROBAXIN) 750 MG tablet Take 1 tablet (750 mg total) by mouth every 6 (six) hours as needed for muscle spasms.   lidocaine (HM LIDOCAINE PATCH) 4 % Place 1 patch onto the skin daily.   gabapentin (NEURONTIN) 300 MG capsule Take 1 capsule (300 mg total) by mouth 3 (three) times daily for 10 days.   oxyCODONE (OXY IR/ROXICODONE) 5 MG immediate release tablet Take 1-2 tablets (5-10 mg total) by mouth every 4 (four) hours as needed for severe pain.     ALLERGIES  Allergies  Allergen Reactions   Demerol [Meperidine] Nausea And Vomiting and Other (See Comments)    Made the patient "violently sick"   Zocor [Simvastatin] Nausea And Vomiting and Other (See Comments)    Made him very jittery, also   Beet [Beta Vulgaris] Nausea And Vomiting   Collard Greens [Wild Lettuce Extract (Lactuca Virosa)] Other (See Comments)    Throw up   Liver Nausea And Vomiting    SOCIAL & SUBSTANCE USE HISTORY  Social History   Socioeconomic History   Marital status: Divorced    Spouse name: Not on file   Number of children: Not on file   Years of education: Not on file   Highest education level: Not on file  Occupational History   Occupation: retired  Tobacco Use   Smoking status: Former    Current packs/day: 0.00    Average packs/day: 1 pack/day for 35.0 years (35.0 ttl pk-yrs)    Types: Cigarettes    Start date: 05/1981    Quit date: 05/2016    Years since quitting: 6.3    Passive exposure: Past   Smokeless tobacco: Never  Vaping Use   Vaping status: Never Used  Substance and Sexual Activity   Alcohol use: No    Alcohol/week: 0.0 standard drinks of alcohol    Comment: denies use of any drugs or alcohol   Drug use: No   Sexual activity:  Not Currently  Other Topics Concern   Not on file  Social History Narrative   Not on file   Social Determinants of Health   Financial Resource Strain: Low Risk  (09/23/2020)   Overall Financial Resource Strain (CARDIA)    Difficulty of Paying Living Expenses: Not hard at all  Food Insecurity: No Food Insecurity (07/03/2022)   Hunger Vital Sign  Worried About Programme researcher, broadcasting/film/video in the Last Year: Never true    Ran Out of Food in the Last Year: Never true  Transportation Needs: No Transportation Needs (07/03/2022)   PRAPARE - Administrator, Civil Service (Medical): No    Lack of Transportation (Non-Medical): No  Physical Activity: Not on file  Stress: No Stress Concern Present (09/23/2020)   [employer] of Occupational Health - Occupational Stress Questionnaire    Feeling of Stress : Not at all  Social Connections: Unknown (09/09/2022)   Received from [facility]   Social Network    Social Network: Not on file   Social History   Tobacco Use  Smoking Status Former   Current packs/day: 0.00   Average packs/day: 1 pack/day for 35.0 years (35.0 ttl pk-yrs)   Types: Cigarettes   Start date: 05/1981   Quit date: 05/2016   Years since quitting: 6.3   Passive exposure: Past  Smokeless Tobacco Never   Social History   Substance and Sexual Activity  Alcohol Use No   Alcohol/week: 0.0 standard drinks of alcohol   Comment: denies use of any drugs or alcohol   Social History   Substance and Sexual Activity  Drug Use No    Additional pertinent information .  FAMILY HISTORY  Family History  Problem Relation Age of Onset   Dementia Father    Family Psychiatric History (if known):  none reported  MENTAL STATUS EXAM (MSE)  Presentation  General Appearance:  Appropriate for Environment  Eye Contact: Good  Speech: Clear and Coherent  Speech Volume: Normal  Handedness: Right   Mood and Affect  Mood: Depressed;  Hopeless  Affect: Congruent   Thought Process  Thought Processes: Coherent  Descriptions of Associations: Intact  Orientation: Full (Time, Place and Person)  Thought Content: Logical  History of Schizophrenia/Schizoaffective disorder:No data recorded Duration of Psychotic Symptoms:No data recorded Hallucinations:Hallucinations: None  Ideas of Reference: None  Suicidal Thoughts:Suicidal Thoughts: Yes, Passive SI Passive Intent and/or Plan: Without Plan; Without Intent  Homicidal Thoughts:Homicidal Thoughts: No   Sensorium  Memory: Immediate Good; Remote Good; Recent Good  Judgment: Fair  Insight: Fair   Art therapist  Concentration: Good  Attention Span: Good  Recall: Good  Fund of Knowledge: Good  Language: Good   Psychomotor Activity  Psychomotor Activity:Psychomotor Activity: Normal   Assets  Assets: Communication Skills   Sleep  Sleep:Sleep: -- (sleeps too much) Number of Hours of Sleep: 10   VITALS  Blood pressure (!) 148/84, pulse 98, temperature 97.9 F (36.6 C), temperature source Oral, resp. rate 20.  LABS  Admission on 09/21/2022  Component Date Value Ref Range Status   Sodium 09/21/2022 137  135 - 145 mmol/L Final   Potassium 09/21/2022 3.8  3.5 - 5.1 mmol/L Final   Chloride 09/21/2022 101  98 - 111 mmol/L Final   CO2 09/21/2022 22  22 - 32 mmol/L Final   Glucose, Bld 09/21/2022 134 (H)  70 - 99 mg/dL Final   Glucose reference range applies only to samples taken after fasting for at least 8 hours.   BUN 09/21/2022 14  8 - 23 mg/dL Final   Creatinine, Ser 09/21/2022 0.95  0.61 - 1.24 mg/dL Final   Calcium 16/11/9602 9.2  8.9 - 10.3 mg/dL Final   Total Protein 54/10/8117 7.6  6.5 - 8.1 g/dL Final   Albumin 14/78/2956 3.7  3.5 - 5.0 g/dL Final   AST 21/30/8657 19  15 - 41 U/L  Final   ALT 09/21/2022 15  0 - 44 U/L Final   Alkaline Phosphatase 09/21/2022 95  38 - 126 U/L Final   Total Bilirubin 09/21/2022 0.6  0.3  - 1.2 mg/dL Final   GFR, Estimated 09/21/2022 >60  >60 mL/min Final   Comment: (NOTE) Calculated using the CKD-EPI Creatinine Equation (2021)    Anion gap 09/21/2022 14  5 - 15 Final   Performed at [facility], [address], [place], [place] [address]   Alcohol, Ethyl (B) 09/21/2022 <10  <10 mg/dL Final   Comment: (NOTE) Lowest detectable limit for serum alcohol is 10 mg/dL.  For medical purposes only. Performed at [facility], [address] [place], [place] [address]    Opiates 09/22/2022 NONE DETECTED  NONE DETECTED Final   Cocaine 09/22/2022 NONE DETECTED  NONE DETECTED Final   Benzodiazepines 09/22/2022 NONE DETECTED  NONE DETECTED Final   Amphetamines 09/22/2022 NONE DETECTED  NONE DETECTED Final   Tetrahydrocannabinol 09/22/2022 NONE DETECTED  NONE DETECTED Final   Barbiturates 09/22/2022 NONE DETECTED  NONE DETECTED Final   Comment: (NOTE) DRUG SCREEN FOR MEDICAL PURPOSES ONLY.  IF CONFIRMATION IS NEEDED FOR ANY PURPOSE, NOTIFY LAB WITHIN 5 DAYS.  LOWEST DETECTABLE LIMITS FOR URINE DRUG SCREEN Drug Class                     Cutoff (ng/mL) Amphetamine and metabolites    1000 Barbiturate and metabolites    200 Benzodiazepine                 200 Opiates and metabolites        300 Cocaine and metabolites        300 THC                            50 Performed at [facility], [address] [place], [place] [address]    WBC 09/21/2022 10.6 (H)  4.0 - 10.5 K/uL Final   RBC 09/21/2022 4.43  4.22 - 5.81 MIL/uL Final   Hemoglobin 09/21/2022 13.7  13.0 - 17.0 g/dL Final   HCT 95/63/8756 41.6  39.0 - 52.0 % Final   MCV 09/21/2022 93.9  80.0 - 100.0 fL Final   MCH 09/21/2022 30.9  26.0 - 34.0 pg Final   MCHC 09/21/2022 32.9  30.0 - 36.0 g/dL Final   RDW 43/32/9518 15.7 (H)  11.5 - 15.5 % Final   Platelets 09/21/2022 327  150 - 400 K/uL Final   nRBC 09/21/2022 0.0  0.0 - 0.2 % Final   Neutrophils Relative %  09/21/2022 74  % Final   Neutro Abs 09/21/2022 7.9 (H)  1.7 - 7.7 K/uL Final   Lymphocytes Relative 09/21/2022 15  % Final   Lymphs Abs 09/21/2022 1.6  0.7 - 4.0 K/uL Final   Monocytes Relative 09/21/2022 9  % Final   Monocytes Absolute 09/21/2022 0.9  0.1 - 1.0 K/uL Final   Eosinophils Relative 09/21/2022 1  % Final   Eosinophils Absolute 09/21/2022 0.1  0.0 - 0.5 K/uL Final   Basophils Relative 09/21/2022 1  % Final   Basophils Absolute 09/21/2022 0.1  0.0 - 0.1 K/uL Final   Immature Granulocytes 09/21/2022 0  % Final   Abs Immature Granulocytes 09/21/2022 0.03  0.00 - 0.07 K/uL Final   Performed at [facility], [address], [place], [place] [address]   Troponin  I (High Sensitivity) 09/21/2022 8  <18 ng/L Final   Comment: (NOTE) Elevated high sensitivity troponin I (hsTnI) values and significant  changes across serial measurements may suggest ACS but many other  chronic and acute conditions are known to elevate hsTnI results.  Refer to the "Links" section for chest pain algorithms and additional  guidance. Performed at [facility], [address], [place], [place] [address]    B Natriuretic Peptide 09/21/2022 42.6  0.0 - 100.0 pg/mL Final   Performed at [facility], [address], [place], [place] [address]   Troponin I (High Sensitivity) 09/21/2022 8  <18 ng/L Final   Comment: (NOTE) Elevated high sensitivity troponin I (hsTnI) values and significant  changes across serial measurements may suggest ACS but many other  chronic and acute conditions are known to elevate hsTnI results.  Refer to the "Links" section for chest pain algorithms and additional  guidance. Performed at [facility], [address] [place], [place] [address]    Specimen Source 09/22/2022 URINE, CLEAN CATCH   Final   Color, Urine 09/22/2022 YELLOW  YELLOW Final   APPearance 09/22/2022 CLEAR  CLEAR Final   Specific Gravity,  Urine 09/22/2022 1.025  1.005 - 1.030 Final   pH 09/22/2022 5.0  5.0 - 8.0 Final   Glucose, UA 09/22/2022 NEGATIVE  NEGATIVE mg/dL Final   Hgb urine dipstick 09/22/2022 NEGATIVE  NEGATIVE Final   Bilirubin Urine 09/22/2022 NEGATIVE  NEGATIVE Final   Ketones, ur 09/22/2022 5 (A)  NEGATIVE mg/dL Final   Protein, ur 60/73/7106 NEGATIVE  NEGATIVE mg/dL Final   Nitrite 26/94/8546 NEGATIVE  NEGATIVE Final   Leukocytes,Ua 09/22/2022 NEGATIVE  NEGATIVE Final   RBC / HPF 09/22/2022 0-5  0 - 5 RBC/hpf Final   WBC, UA 09/22/2022 0-5  0 - 5 WBC/hpf Final   Comment:        Reflex urine culture not performed if WBC <=10, OR if Squamous epithelial cells >5. If Squamous epithelial cells >5 suggest recollection.    Bacteria, UA 09/22/2022 NONE SEEN  NONE SEEN Final   Squamous Epithelial / HPF 09/22/2022 0-5  0 - 5 /HPF Final   Mucus 09/22/2022 PRESENT   Final   Performed at [facility], [address], [place], [place] [address]    PSYCHIATRIC REVIEW OF SYSTEMS (ROS)  - patient reports that he is having breathing issues, which requires him to use oxygen most of the time. He talked about his multiple health issues especially his stage 4 lung cancer - Patient presents with symptoms of depression' - Expressed suicidal ideation as recent as the previous day, contemplating overdose on muscle relaxers but denies current suicidal and homicidal ideations because he knows he won't do that to himself. - History of a suicide attempt approximately 10 years ago and did get admitted to an inpatient psych hospital. - Reports auditory hallucinations in the past, though unable to recall the timing of the last occurrence. - Denies current suicidal intent and homicidal thoughts. - Sleep pattern includes excessive sleeping, approximately 10-12 hours per night. - Cognitive function appears intact with engagement in activities such as playing computer games. - Patient reports feeling safe for discharge  but does not believe his current living environment is conducive. - He is receptive to starting therapy.  Additional findings:      Musculoskeletal: No abnormal movements observed      Gait & Station: Laying/Sitting      Pain Screening: Present -  mild to moderate      Nutrition & Dental Concerns: Decrease in food intake and/or loss of appetite  RISK FORMULATION/ASSESSMENT  Is the patient experiencing any suicidal or homicidal ideations: No       Explain if yes: denies current suicidal and homicidal ideations Protective factors considered for safety management: access to appropriate clinical interventions  Risk factors/concerns considered for safety management:  Prior attempt Depression Physical illness/chronic pain Age over 71 Hopelessness Unmarried  Is there a safety management plan with the patient and treatment team to minimize risk factors and promote protective factors: Yes           Explain: therapy and case management Is crisis care placement or psychiatric hospitalization recommended: No     Based on my current evaluation and risk assessment, patient is determined at this time to be at:  Moderate Risk  *RISK ASSESSMENT Risk assessment is a dynamic process; it is possible that this patient's condition, and risk level, may change. This should be re-evaluated and managed over time as appropriate. Please re-consult psychiatric consult services if additional assistance is needed in terms of risk assessment and management. If your team decides to discharge this patient, please advise the patient how to best access emergency psychiatric services, or to call 911, if their condition worsens or they feel unsafe in any way.   Norval Morton, NP Telepsychiatry Consult Services

## 2022-09-22 NOTE — Progress Notes (Signed)
CSW received consult for patient to arrange home health services.  CSW attempted to reach patient and daughter via phone without success.   CSW spoke with MD who states patient is in need of home health services due to his history of lung cancer, COPD, and 4L oxygen requirement.  CSW spoke with Lance Bosch at the [facility] to make notification of patient's presence in the ED. Patient's PCP is Dr. Pearson Grippe.   Edwin Dada, MSW, LCSW Transitions of Care  Clinical Social Worker II [phone]

## 2022-09-23 DIAGNOSIS — Z7984 Long term (current) use of oral hypoglycemic drugs: Secondary | ICD-10-CM | POA: Insufficient documentation

## 2022-09-23 DIAGNOSIS — J449 Chronic obstructive pulmonary disease, unspecified: Secondary | ICD-10-CM | POA: Insufficient documentation

## 2022-09-23 DIAGNOSIS — R42 Dizziness and giddiness: Secondary | ICD-10-CM | POA: Insufficient documentation

## 2022-09-23 DIAGNOSIS — I10 Essential (primary) hypertension: Secondary | ICD-10-CM | POA: Insufficient documentation

## 2022-09-23 DIAGNOSIS — Z85118 Personal history of other malignant neoplasm of bronchus and lung: Secondary | ICD-10-CM | POA: Insufficient documentation

## 2022-09-23 DIAGNOSIS — Z7901 Long term (current) use of anticoagulants: Secondary | ICD-10-CM | POA: Insufficient documentation

## 2022-09-23 DIAGNOSIS — E119 Type 2 diabetes mellitus without complications: Secondary | ICD-10-CM | POA: Insufficient documentation

## 2022-09-24 ENCOUNTER — Emergency Department (HOSPITAL_COMMUNITY)
Admission: EM | Admit: 2022-09-24 | Discharge: 2022-09-24 | Disposition: A | Discharge status: Home / Self Care | Payer: Non-veteran care | Source: Home / Self Care | Attending: Emergency Medicine | Admitting: Emergency Medicine

## 2022-09-24 ENCOUNTER — Other Ambulatory Visit: Payer: Self-pay

## 2022-09-24 ENCOUNTER — Emergency Department (HOSPITAL_COMMUNITY): Payer: Non-veteran care

## 2022-09-24 ENCOUNTER — Encounter (HOSPITAL_COMMUNITY): Payer: Self-pay

## 2022-09-24 DIAGNOSIS — R42 Dizziness and giddiness: Secondary | ICD-10-CM

## 2022-09-24 DIAGNOSIS — E860 Dehydration: Secondary | ICD-10-CM

## 2022-09-24 DIAGNOSIS — Z7901 Long term (current) use of anticoagulants: Secondary | ICD-10-CM

## 2022-09-24 LAB — CBC WITH DIFFERENTIAL/PLATELET
Abs Immature Granulocytes: 0.06 10*3/uL (ref 0.00–0.07)
Basophils Absolute: 0.1 10*3/uL (ref 0.0–0.1)
Basophils Relative: 1 %
Eosinophils Absolute: 0.3 10*3/uL (ref 0.0–0.5)
Eosinophils Relative: 3 %
HCT: 43.5 % (ref 39.0–52.0)
Hemoglobin: 13.6 g/dL (ref 13.0–17.0)
Immature Granulocytes: 1 %
Lymphocytes Relative: 24 %
Lymphs Abs: 2.3 10*3/uL (ref 0.7–4.0)
MCH: 30.4 pg (ref 26.0–34.0)
MCHC: 31.3 g/dL (ref 30.0–36.0)
MCV: 97.1 fL (ref 80.0–100.0)
Monocytes Absolute: 1.1 10*3/uL — ABNORMAL HIGH (ref 0.1–1.0)
Monocytes Relative: 11 %
Neutro Abs: 5.9 10*3/uL (ref 1.7–7.7)
Neutrophils Relative %: 60 %
Platelets: 320 10*3/uL (ref 150–400)
RBC: 4.48 MIL/uL (ref 4.22–5.81)
RDW: 15.6 % — ABNORMAL HIGH (ref 11.5–15.5)
WBC: 9.6 10*3/uL (ref 4.0–10.5)
nRBC: 0 % (ref 0.0–0.2)

## 2022-09-24 LAB — BASIC METABOLIC PANEL
Anion gap: 10 (ref 5–15)
BUN: 19 mg/dL (ref 8–23)
CO2: 27 mmol/L (ref 22–32)
Calcium: 9 mg/dL (ref 8.9–10.3)
Chloride: 101 mmol/L (ref 98–111)
Creatinine, Ser: 1.2 mg/dL (ref 0.61–1.24)
GFR, Estimated: >60 mL/min (ref >60)
Glucose, Bld: 107 mg/dL — ABNORMAL HIGH (ref 70–99)
Potassium: 4.4 mmol/L (ref 3.5–5.1)
Sodium: 138 mmol/L (ref 135–145)

## 2022-09-24 MED ORDER — SODIUM CHLORIDE 0.9 % IV BOLUS
1000.0000 mL | Freq: Once | INTRAVENOUS | Status: AC
  Administered 2022-09-24: 1000 mL via INTRAVENOUS

## 2022-09-24 MED ORDER — SODIUM CHLORIDE (PF) 0.9 % IJ SOLN
INTRAMUSCULAR | Status: AC
  Filled 2022-09-24: qty 50

## 2022-09-24 MED ORDER — OXYCODONE-ACETAMINOPHEN 5-325 MG PO TABS
1.0000 | ORAL_TABLET | Freq: Once | ORAL | Status: AC
  Administered 2022-09-24: 1 via ORAL
  Filled 2022-09-24: qty 1

## 2022-09-24 MED ORDER — IOHEXOL 300 MG/ML  SOLN
100.0000 mL | Freq: Once | INTRAMUSCULAR | Status: AC | PRN
  Administered 2022-09-24: 100 mL via INTRAVENOUS

## 2022-09-24 MED ORDER — OXYCODONE HCL 5 MG PO TABS
5.0000 mg | ORAL_TABLET | Freq: Once | ORAL | Status: AC
  Administered 2022-09-24: 5 mg via ORAL
  Filled 2022-09-24: qty 1

## 2022-09-24 NOTE — ED Triage Notes (Signed)
Pt BIB [facility] from home. Pt c/o dizziness and seeing spots when standing up to walk. Pt reports feeling like he will pass out since yesterday. Hx lung cancer/ COPD.   108 CBG 125/92 102HR 16RR 93% 6L [place]

## 2022-09-24 NOTE — ED Provider Notes (Signed)
[facility] Provider Note   CSN: [id number] Arrival date & time: 09/23/22  2358     History  Chief Complaint  Patient presents with   Dizziness    Mario [name] is a 72 y.o. male.  The history is provided by the patient.  Dizziness He has history of hypertension, diabetes, COPD, pulmonary embolism anticoagulated on apixaban, stage IV lung cancer who comes in complaining of feeling lightheaded when he stands up.  This still started tonight.  He states he sees spots before his eyes.  He feels better as soon as he sits down or lays down.  He denies chest pain, heaviness, tightness, or pressure.  There has been slight worsening of his chronic dyspnea over the last 2-3 days.  He denies cough, fever, chills, sweats.   Home Medications Prior to Admission medications   Medication Sig Start Date End Date Taking? Authorizing Provider  albuterol (PROVENTIL) (2.5 MG/3ML) 0.083% nebulizer solution Inhale 3 mLs (2.5 mg total) by nebulization every 4 (four) hours as needed for wheezing or shortness of breath. 08/04/20 10/02/22  Rolly Salter, MD  albuterol (VENTOLIN HFA) 108 (90 Base) MCG/ACT inhaler Inhale 2 puffs into the lungs every 4 (four) hours as needed for wheezing or shortness of breath. 11/04/21   Glade Lloyd, MD  apixaban (ELIQUIS) 5 MG TABS tablet Take 5 mg by mouth 2 (two) times daily.    [provider]  buPROPion (WELLBUTRIN XL) 300 MG 24 hr tablet Take 300 mg by mouth every morning. 02/05/20   [provider]  busPIRone (BUSPAR) 15 MG tablet Take 15 mg by mouth daily.    [provider]  Carboxymethylcellulose Sod PF 0.5 % SOLN Place 1 drop into both eyes 4 (four) times daily as needed (dry eyes).    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    [provider]  cyclobenzaprine (FLEXERIL) 10  MG tablet Take 30 mg by mouth at bedtime.    [provider]  cycloSPORINE (RESTASIS) 0.05 % ophthalmic emulsion Place 1 drop into both eyes every 12 (twelve) hours.    [provider]  diltiazem (CARDIZEM CD) 180 MG 24 hr capsule Take 180 mg by mouth daily.    [provider]  docusate sodium (COLACE) 100 MG capsule Take 100 mg by mouth 2 (two) times daily.    [provider]  DULoxetine (CYMBALTA) 60 MG capsule Take 1 capsule (60 mg total) by mouth 2 (two) times daily. 10/04/21 09/21/22  Earney Navy, NP  fluticasone (FLONASE) 50 MCG/ACT nasal spray Place 2 sprays into both nostrils daily.     [provider]  folic acid (FOLVITE) 1 MG tablet Take 1 mg by mouth daily.    [provider]  gabapentin (NEURONTIN) 300 MG capsule Take 1 capsule (300 mg total) by mouth 3 (three) times daily for 10 days. 09/02/22 09/21/22  Glyn Ade, MD  guaiFENesin (MUCINEX) 600 MG 12 hr tablet Take 600 mg by mouth 2 (two) times daily as needed for cough.    [provider]  lidocaine (HM LIDOCAINE PATCH) 4 % Place 1 patch onto the skin daily. 09/02/22   Glyn Ade, MD  lisinopril (ZESTRIL) 20 MG tablet Take 20 mg by mouth daily.    [provider]  lurasidone (LATUDA) 40 MG TABS tablet Take 40 mg by mouth  every evening. After supper    [provider]  Melatonin 3 MG CAPS Take 6 mg by mouth at bedtime as needed (sleep).    [provider]  metFORMIN (GLUCOPHAGE) 850 MG tablet Take 850 mg by mouth daily with breakfast.    [provider]  methocarbamol (ROBAXIN) 750 MG tablet Take 1 tablet (750 mg total) by mouth every 6 (six) hours as needed for muscle spasms. 07/04/22   Burnadette Pop, MD  methotrexate (RHEUMATREX) 2.5 MG tablet Take 15 mg by mouth once a week. Caution:Chemotherapy. Protect from light. Tuesdays    [provider]  Mometasone Furoate 200 MCG/ACT AERO Inhale 2 puffs into the lungs 2  (two) times daily. 11/04/21   Glade Lloyd, MD  mycophenolate (CELLCEPT) 250 MG capsule Take 1,500 mg by mouth 2 (two) times daily.    [provider]  Nutritional Supplements (ENSURE ORIGINAL) LIQD Take 1 Bottle by mouth daily as needed (in between meals).    [provider]  ondansetron (ZOFRAN-ODT) 4 MG disintegrating tablet Take 1 tablet (4 mg total) by mouth every 8 (eight) hours as needed for nausea or vomiting. 10/04/21   Earney Navy, NP  oxcarbazepine (TRILEPTAL) 600 MG tablet Take 600 mg by mouth See admin instructions. Take 1 tablet BID and at bedtime    [provider]  oxyCODONE (OXY IR/ROXICODONE) 5 MG immediate release tablet Take 1-2 tablets (5-10 mg total) by mouth every 4 (four) hours as needed for severe pain. 09/10/22   Smitty Knudsen, PA-C  pantoprazole (PROTONIX) 40 MG tablet Take 40 mg by mouth daily. Take on an empty stomach    [provider]  polyethylene glycol powder (GLYCOLAX/MIRALAX) 17 GM/SCOOP powder Take 17 g by mouth daily.    [provider]  prednisoLONE acetate (PRED FORTE) 1 % ophthalmic suspension Place 1 drop into both eyes See admin instructions. 1 drop every hour while awake    [provider]  psyllium (METAMUCIL SMOOTH TEXTURE) 58.6 % powder Take 1 packet by mouth 3 (three) times daily. 06/29/22   Elayne Snare K, DO  senna-docusate (SENOKOT-S) 8.6-50 MG tablet Take 1 tablet by mouth daily. 06/29/22   Elayne Snare K, DO  tamsulosin (FLOMAX) 0.4 MG CAPS capsule Take 0.8 mg by mouth daily.    [provider]  Tiotropium Bromide-Olodaterol 2.5-2.5 MCG/ACT AERS Inhale 2 each into the lungs every morning. 2 puffs    [provider]  traZODone (DESYREL) 50 MG tablet Take 75 mg by mouth at bedtime.    [provider]      Allergies    Demerol [meperidine], Zocor [simvastatin], Beet [beta vulgaris], Collard greens [wild lettuce extract (lactuca virosa)], and Liver     Review of Systems   Review of Systems  Neurological:  Positive for dizziness.  All other systems reviewed and are negative.   Physical Exam Updated Vital Signs BP 105/68   Pulse 97   Temp 97.8 F (36.6 C) (Oral)   Resp 18   SpO2 93%  Physical Exam Vitals and nursing note reviewed.   72 year old male, resting comfortably and in no acute distress. Vital signs are normal. Oxygen saturation is 93%, which is normal. Head is normocephalic and atraumatic. PERRLA, EOMI. Oropharynx is clear. Neck is nontender and supple without adenopathy or JVD. Back is nontender and there is no CVA tenderness. Lungs are clear without rales, wheezes, or rhonchi. Chest is nontender. Heart has regular rate and rhythm without murmur. Abdomen  is soft, flat, nontender. Extremities have no cyanosis or edema, full range of motion is present. Skin is warm and dry without rash. Neurologic: Mental status is normal, cranial nerves are intact, moves all extremities equally.  ED Results / Procedures / Treatments   Labs (all labs ordered are listed, but only abnormal results are displayed) Labs Reviewed - No data to display  EKG EKG Interpretation Date/Time:  Saturday September 24 2022 00:10:03 EDT Ventricular Rate:  105 PR Interval:  203 QRS Duration:  108 QT Interval:  333 QTC Calculation: 441 R Axis:   -42  Text Interpretation: Sinus tachycardia Left axis deviation Low voltage, precordial leads When compared with ECG of 09/21/2022, Premature ventricular complexes are no longer present Confirmed by Dione Booze ([id number]) on 09/24/2022 12:16:15 AM  Radiology No results found.  Procedures Procedures  Cardiac monitor shows normal sinus rhythm, per my interpretation.  Medications Ordered in ED Medications - No data to display  ED Course/ Medical Decision Making/ A&P                             Medical Decision Making Amount and/or Complexity of Data Reviewed Labs: ordered.  Risk Prescription drug  management.   Dizziness which appears to be orthostatic.  Although blood pressure is normal here, it is significantly lower than it was on ED visit on 09/21/2022 or 09/20/2022.  I have ordered screening labs of CBC, basic metabolic panel and have ordered orthostatic vital signs.  I reviewed and interpreted his electrocardiogram, my interpretation is sinus tachycardia, low voltage, left axis deviation similar to prior ECGs with exception of PVCs are no longer present.  Orthostatic vital signs show significant drop in blood pressure.  I have ordered IV fluids.  I reviewed and interpreted his labs and my interpretation is minimal elevation of random glucose actually indicating good glycemic control and a diabetic and otherwise normal metabolic panel, normal CBC.  Patient will need to be reevaluated following IV hydration.  I have signed the case out to Dr. Estell Harpin.  Final Clinical Impression(s) / ED Diagnoses Final diagnoses:  Orthostatic dizziness  Chronic anticoagulation    Rx / DC Orders ED Discharge Orders     None         Dione Booze, MD 09/24/22 [phone]

## 2022-09-24 NOTE — Discharge Instructions (Addendum)
Drink plenty of fluids.  Return if symptoms are getting worse at home. Follow up with your md next week

## 2022-09-25 ENCOUNTER — Inpatient Hospital Stay (HOSPITAL_COMMUNITY)
Admission: EM | Admit: 2022-09-25 | Discharge: 2022-10-01 | DRG: 871 | Disposition: A | Discharge status: Home / Self Care | Payer: Non-veteran care | Attending: Family Medicine | Admitting: Family Medicine

## 2022-09-25 ENCOUNTER — Emergency Department (HOSPITAL_COMMUNITY): Payer: Non-veteran care

## 2022-09-25 ENCOUNTER — Encounter (HOSPITAL_COMMUNITY): Payer: Self-pay | Admitting: *Deleted

## 2022-09-25 ENCOUNTER — Other Ambulatory Visit: Payer: Self-pay

## 2022-09-25 DIAGNOSIS — N400 Enlarged prostate without lower urinary tract symptoms: Secondary | ICD-10-CM | POA: Diagnosis present

## 2022-09-25 DIAGNOSIS — Z87891 Personal history of nicotine dependence: Secondary | ICD-10-CM | POA: Diagnosis not present

## 2022-09-25 DIAGNOSIS — M456 Ankylosing spondylitis lumbar region: Secondary | ICD-10-CM | POA: Diagnosis present

## 2022-09-25 DIAGNOSIS — J440 Chronic obstructive pulmonary disease with acute lower respiratory infection: Secondary | ICD-10-CM | POA: Diagnosis present

## 2022-09-25 DIAGNOSIS — I129 Hypertensive chronic kidney disease with stage 1 through stage 4 chronic kidney disease, or unspecified chronic kidney disease: Secondary | ICD-10-CM | POA: Diagnosis present

## 2022-09-25 DIAGNOSIS — F339 Major depressive disorder, recurrent, unspecified: Secondary | ICD-10-CM | POA: Diagnosis present

## 2022-09-25 DIAGNOSIS — G4733 Obstructive sleep apnea (adult) (pediatric): Secondary | ICD-10-CM | POA: Diagnosis present

## 2022-09-25 DIAGNOSIS — Z885 Allergy status to narcotic agent status: Secondary | ICD-10-CM

## 2022-09-25 DIAGNOSIS — Z9981 Dependence on supplemental oxygen: Secondary | ICD-10-CM

## 2022-09-25 DIAGNOSIS — J9621 Acute and chronic respiratory failure with hypoxia: Secondary | ICD-10-CM | POA: Diagnosis present

## 2022-09-25 DIAGNOSIS — Z923 Personal history of irradiation: Secondary | ICD-10-CM

## 2022-09-25 DIAGNOSIS — N1831 Chronic kidney disease, stage 3a: Secondary | ICD-10-CM | POA: Diagnosis present

## 2022-09-25 DIAGNOSIS — J159 Unspecified bacterial pneumonia: Secondary | ICD-10-CM | POA: Diagnosis present

## 2022-09-25 DIAGNOSIS — Z85118 Personal history of other malignant neoplasm of bronchus and lung: Secondary | ICD-10-CM | POA: Diagnosis not present

## 2022-09-25 DIAGNOSIS — K219 Gastro-esophageal reflux disease without esophagitis: Secondary | ICD-10-CM | POA: Diagnosis present

## 2022-09-25 DIAGNOSIS — Z86711 Personal history of pulmonary embolism: Secondary | ICD-10-CM | POA: Diagnosis present

## 2022-09-25 DIAGNOSIS — Z79624 Long term (current) use of inhibitors of nucleotide synthesis: Secondary | ICD-10-CM

## 2022-09-25 DIAGNOSIS — J189 Pneumonia, unspecified organism: Principal | ICD-10-CM

## 2022-09-25 DIAGNOSIS — Z85828 Personal history of other malignant neoplasm of skin: Secondary | ICD-10-CM

## 2022-09-25 DIAGNOSIS — J441 Chronic obstructive pulmonary disease with (acute) exacerbation: Secondary | ICD-10-CM | POA: Diagnosis present

## 2022-09-25 DIAGNOSIS — Z7951 Long term (current) use of inhaled steroids: Secondary | ICD-10-CM

## 2022-09-25 DIAGNOSIS — E1122 Type 2 diabetes mellitus with diabetic chronic kidney disease: Secondary | ICD-10-CM | POA: Diagnosis present

## 2022-09-25 DIAGNOSIS — I4891 Unspecified atrial fibrillation: Secondary | ICD-10-CM | POA: Diagnosis present

## 2022-09-25 DIAGNOSIS — Z9151 Personal history of suicidal behavior: Secondary | ICD-10-CM

## 2022-09-25 DIAGNOSIS — A4189 Other specified sepsis: Secondary | ICD-10-CM | POA: Diagnosis present

## 2022-09-25 DIAGNOSIS — I10 Essential (primary) hypertension: Secondary | ICD-10-CM | POA: Diagnosis not present

## 2022-09-25 DIAGNOSIS — C3492 Malignant neoplasm of unspecified part of left bronchus or lung: Secondary | ICD-10-CM | POA: Diagnosis present

## 2022-09-25 DIAGNOSIS — J449 Chronic obstructive pulmonary disease, unspecified: Secondary | ICD-10-CM | POA: Diagnosis not present

## 2022-09-25 DIAGNOSIS — I2510 Atherosclerotic heart disease of native coronary artery without angina pectoris: Secondary | ICD-10-CM | POA: Diagnosis present

## 2022-09-25 DIAGNOSIS — F4321 Adjustment disorder with depressed mood: Secondary | ICD-10-CM | POA: Diagnosis present

## 2022-09-25 DIAGNOSIS — D72829 Elevated white blood cell count, unspecified: Secondary | ICD-10-CM | POA: Diagnosis not present

## 2022-09-25 DIAGNOSIS — I252 Old myocardial infarction: Secondary | ICD-10-CM

## 2022-09-25 DIAGNOSIS — Z7901 Long term (current) use of anticoagulants: Secondary | ICD-10-CM | POA: Diagnosis not present

## 2022-09-25 DIAGNOSIS — Z91018 Allergy to other foods: Secondary | ICD-10-CM

## 2022-09-25 DIAGNOSIS — Z888 Allergy status to other drugs, medicaments and biological substances status: Secondary | ICD-10-CM

## 2022-09-25 DIAGNOSIS — Z91014 Allergy to mammalian meats: Secondary | ICD-10-CM

## 2022-09-25 DIAGNOSIS — U071 COVID-19: Secondary | ICD-10-CM | POA: Diagnosis present

## 2022-09-25 DIAGNOSIS — R0602 Shortness of breath: Secondary | ICD-10-CM | POA: Diagnosis present

## 2022-09-25 DIAGNOSIS — E119 Type 2 diabetes mellitus without complications: Secondary | ICD-10-CM

## 2022-09-25 DIAGNOSIS — Z1589 Genetic susceptibility to other disease: Secondary | ICD-10-CM

## 2022-09-25 DIAGNOSIS — Z79899 Other long term (current) drug therapy: Secondary | ICD-10-CM

## 2022-09-25 DIAGNOSIS — J1282 Pneumonia due to coronavirus disease 2019: Secondary | ICD-10-CM | POA: Diagnosis present

## 2022-09-25 DIAGNOSIS — Z7984 Long term (current) use of oral hypoglycemic drugs: Secondary | ICD-10-CM | POA: Diagnosis not present

## 2022-09-25 DIAGNOSIS — R0689 Other abnormalities of breathing: Secondary | ICD-10-CM | POA: Diagnosis present

## 2022-09-25 HISTORY — DX: Ankylosing spondylitis lumbar region: M45.6

## 2022-09-25 LAB — TROPONIN I (HIGH SENSITIVITY)
Troponin I (High Sensitivity): 15 ng/L (ref <18)
Troponin I (High Sensitivity): 18 ng/L — ABNORMAL HIGH (ref <18)

## 2022-09-25 LAB — CBC WITH DIFFERENTIAL/PLATELET
Abs Immature Granulocytes: 0.04 10*3/uL (ref 0.00–0.07)
Basophils Absolute: 0 10*3/uL (ref 0.0–0.1)
Basophils Relative: 0 %
Eosinophils Absolute: 0 10*3/uL (ref 0.0–0.5)
Eosinophils Relative: 0 %
HCT: 42.3 % (ref 39.0–52.0)
Hemoglobin: 13.4 g/dL (ref 13.0–17.0)
Immature Granulocytes: 0 %
Lymphocytes Relative: 4 %
Lymphs Abs: 0.5 10*3/uL — ABNORMAL LOW (ref 0.7–4.0)
MCH: 30.5 pg (ref 26.0–34.0)
MCHC: 31.7 g/dL (ref 30.0–36.0)
MCV: 96.1 fL (ref 80.0–100.0)
Monocytes Absolute: 0.8 10*3/uL (ref 0.1–1.0)
Monocytes Relative: 7 %
Neutro Abs: 10.2 10*3/uL — ABNORMAL HIGH (ref 1.7–7.7)
Neutrophils Relative %: 89 %
Platelets: 297 10*3/uL (ref 150–400)
RBC: 4.4 MIL/uL (ref 4.22–5.81)
RDW: 15.5 % (ref 11.5–15.5)
WBC: 11.6 10*3/uL — ABNORMAL HIGH (ref 4.0–10.5)
nRBC: 0 % (ref 0.0–0.2)

## 2022-09-25 LAB — COMPREHENSIVE METABOLIC PANEL
ALT: 16 U/L (ref 0–44)
AST: 19 U/L (ref 15–41)
Albumin: 3.7 g/dL (ref 3.5–5.0)
Alkaline Phosphatase: 87 U/L (ref 38–126)
Anion gap: 10 (ref 5–15)
BUN: 15 mg/dL (ref 8–23)
CO2: 25 mmol/L (ref 22–32)
Calcium: 8.6 mg/dL — ABNORMAL LOW (ref 8.9–10.3)
Chloride: 100 mmol/L (ref 98–111)
Creatinine, Ser: 1 mg/dL (ref 0.61–1.24)
GFR, Estimated: >60 mL/min (ref >60)
Glucose, Bld: 122 mg/dL — ABNORMAL HIGH (ref 70–99)
Potassium: 4.4 mmol/L (ref 3.5–5.1)
Sodium: 135 mmol/L (ref 135–145)
Total Bilirubin: 0.4 mg/dL (ref 0.3–1.2)
Total Protein: 7.4 g/dL (ref 6.5–8.1)

## 2022-09-25 LAB — URINALYSIS, W/ REFLEX TO CULTURE (INFECTION SUSPECTED)
Bacteria, UA: NONE SEEN
Bilirubin Urine: NEGATIVE
Glucose, UA: NEGATIVE mg/dL
Hgb urine dipstick: NEGATIVE
Ketones, ur: NEGATIVE mg/dL
Leukocytes,Ua: NEGATIVE
Nitrite: NEGATIVE
Protein, ur: NEGATIVE mg/dL
Specific Gravity, Urine: 1.023 (ref 1.005–1.030)
pH: 6 (ref 5.0–8.0)

## 2022-09-25 LAB — MAGNESIUM: Magnesium: 2.2 mg/dL (ref 1.7–2.4)

## 2022-09-25 LAB — GLUCOSE, CAPILLARY: Glucose-Capillary: 154 mg/dL — ABNORMAL HIGH (ref 70–99)

## 2022-09-25 LAB — PROTIME-INR
INR: 1 (ref 0.8–1.2)
Prothrombin Time: 13.7 seconds (ref 11.4–15.2)

## 2022-09-25 LAB — LIPASE, BLOOD: Lipase: 35 U/L (ref 11–51)

## 2022-09-25 LAB — SARS CORONAVIRUS 2 BY RT PCR: SARS Coronavirus 2 by RT PCR: POSITIVE — AB

## 2022-09-25 LAB — I-STAT CG4 LACTIC ACID, ED
Lactic Acid, Venous: 1.6 mmol/L (ref 0.5–1.9)
Lactic Acid, Venous: 1.1 mmol/L (ref 0.5–1.9)

## 2022-09-25 LAB — BRAIN NATRIURETIC PEPTIDE: B Natriuretic Peptide: 20.8 pg/mL (ref 0.0–100.0)

## 2022-09-25 LAB — PROCALCITONIN: Procalcitonin: <0.1 ng/mL

## 2022-09-25 LAB — STREP PNEUMONIAE URINARY ANTIGEN: Strep Pneumo Urinary Antigen: NEGATIVE

## 2022-09-25 MED ORDER — SODIUM CHLORIDE 0.9 % IV SOLN
200.0000 mg | Freq: Once | INTRAVENOUS | Status: AC
  Administered 2022-09-25: 200 mg via INTRAVENOUS
  Filled 2022-09-25: qty 40

## 2022-09-25 MED ORDER — LISINOPRIL 20 MG PO TABS
20.0000 mg | ORAL_TABLET | Freq: Every day | ORAL | Status: DC
  Administered 2022-09-25 – 2022-10-01 (×7): 20 mg via ORAL
  Filled 2022-09-25 (×7): qty 1

## 2022-09-25 MED ORDER — MELATONIN 3 MG PO TABS
6.0000 mg | ORAL_TABLET | Freq: Every evening | ORAL | Status: DC | PRN
  Administered 2022-09-30: 6 mg via ORAL
  Filled 2022-09-25: qty 2

## 2022-09-25 MED ORDER — BUSPIRONE HCL 5 MG PO TABS
15.0000 mg | ORAL_TABLET | Freq: Every day | ORAL | Status: DC
  Administered 2022-09-25 – 2022-10-01 (×7): 15 mg via ORAL
  Filled 2022-09-25 (×7): qty 1

## 2022-09-25 MED ORDER — SODIUM CHLORIDE 0.9 % IV BOLUS
1000.0000 mL | Freq: Once | INTRAVENOUS | Status: AC
  Administered 2022-09-25: 1000 mL via INTRAVENOUS

## 2022-09-25 MED ORDER — DILTIAZEM HCL ER COATED BEADS 180 MG PO CP24
180.0000 mg | ORAL_CAPSULE | Freq: Every day | ORAL | Status: DC
  Administered 2022-09-25 – 2022-10-01 (×7): 180 mg via ORAL
  Filled 2022-09-25 (×7): qty 1

## 2022-09-25 MED ORDER — METFORMIN HCL 850 MG PO TABS
850.0000 mg | ORAL_TABLET | Freq: Every day | ORAL | Status: DC
  Administered 2022-09-26 – 2022-10-01 (×6): 850 mg via ORAL
  Filled 2022-09-25 (×6): qty 1

## 2022-09-25 MED ORDER — CYCLOBENZAPRINE HCL 10 MG PO TABS
10.0000 mg | ORAL_TABLET | Freq: Every day | ORAL | Status: DC
  Administered 2022-09-25 – 2022-09-30 (×6): 10 mg via ORAL
  Filled 2022-09-25 (×6): qty 1

## 2022-09-25 MED ORDER — LURASIDONE HCL 40 MG PO TABS
40.0000 mg | ORAL_TABLET | Freq: Every evening | ORAL | Status: DC
  Administered 2022-09-25 – 2022-09-30 (×6): 40 mg via ORAL
  Filled 2022-09-25 (×6): qty 1

## 2022-09-25 MED ORDER — BUPROPION HCL ER (XL) 300 MG PO TB24
300.0000 mg | ORAL_TABLET | Freq: Every morning | ORAL | Status: DC
  Administered 2022-09-26 – 2022-10-01 (×6): 300 mg via ORAL
  Filled 2022-09-25 (×6): qty 1

## 2022-09-25 MED ORDER — TAMSULOSIN HCL 0.4 MG PO CAPS
0.8000 mg | ORAL_CAPSULE | Freq: Every day | ORAL | Status: DC
  Administered 2022-09-25 – 2022-10-01 (×7): 0.8 mg via ORAL
  Filled 2022-09-25 (×7): qty 2

## 2022-09-25 MED ORDER — ONDANSETRON HCL 4 MG PO TABS: 4.0000 mg | ORAL_TABLET | Freq: Four times a day (QID) | ORAL | Status: DC | PRN

## 2022-09-25 MED ORDER — GABAPENTIN 300 MG PO CAPS
300.0000 mg | ORAL_CAPSULE | Freq: Three times a day (TID) | ORAL | Status: DC
  Administered 2022-09-25 – 2022-10-01 (×18): 300 mg via ORAL
  Filled 2022-09-25 (×18): qty 1

## 2022-09-25 MED ORDER — PIPERACILLIN-TAZOBACTAM 3.375 G IVPB 30 MIN
3.3750 g | Freq: Once | INTRAVENOUS | Status: AC
  Administered 2022-09-25: 3.375 g via INTRAVENOUS
  Filled 2022-09-25: qty 50

## 2022-09-25 MED ORDER — DEXAMETHASONE SODIUM PHOSPHATE 10 MG/ML IJ SOLN
6.0000 mg | INTRAMUSCULAR | Status: DC
  Administered 2022-09-25 – 2022-10-01 (×7): 6 mg via INTRAVENOUS
  Filled 2022-09-25 (×7): qty 1

## 2022-09-25 MED ORDER — ENOXAPARIN SODIUM 40 MG/0.4ML IJ SOSY
40.0000 mg | PREFILLED_SYRINGE | INTRAMUSCULAR | Status: DC
  Administered 2022-09-25: 40 mg via SUBCUTANEOUS
  Filled 2022-09-25: qty 0.4

## 2022-09-25 MED ORDER — SODIUM CHLORIDE 0.9 % IV SOLN
100.0000 mg | Freq: Every day | INTRAVENOUS | Status: AC
  Administered 2022-09-26 – 2022-09-27 (×2): 100 mg via INTRAVENOUS
  Filled 2022-09-25 (×2): qty 20

## 2022-09-25 MED ORDER — LIDOCAINE 5 % EX PTCH
1.0000 | MEDICATED_PATCH | CUTANEOUS | Status: DC
  Administered 2022-09-25 – 2022-09-30 (×6): 1 via TRANSDERMAL
  Filled 2022-09-25 (×7): qty 1

## 2022-09-25 MED ORDER — IPRATROPIUM-ALBUTEROL 0.5-2.5 (3) MG/3ML IN SOLN
3.0000 mL | Freq: Four times a day (QID) | RESPIRATORY_TRACT | Status: DC
  Administered 2022-09-25: 3 mL via RESPIRATORY_TRACT
  Filled 2022-09-25: qty 3

## 2022-09-25 MED ORDER — ACETAMINOPHEN 650 MG RE SUPP: 650.0000 mg | Freq: Four times a day (QID) | RECTAL | Status: DC | PRN

## 2022-09-25 MED ORDER — ONDANSETRON HCL 4 MG/2ML IJ SOLN: 4.0000 mg | Freq: Four times a day (QID) | INTRAMUSCULAR | Status: DC | PRN

## 2022-09-25 MED ORDER — POLYETHYLENE GLYCOL 3350 17 G PO PACK
17.0000 g | PACK | Freq: Every day | ORAL | Status: DC
  Administered 2022-09-25 – 2022-10-01 (×6): 17 g via ORAL
  Filled 2022-09-25 (×6): qty 1

## 2022-09-25 MED ORDER — LIDOCAINE 4 % EX PTCH: 1.0000 | MEDICATED_PATCH | CUTANEOUS | Status: DC

## 2022-09-25 MED ORDER — INSULIN ASPART 100 UNIT/ML IJ SOLN
0.0000 [IU] | Freq: Three times a day (TID) | INTRAMUSCULAR | Status: DC
  Administered 2022-09-25: 4 [IU] via SUBCUTANEOUS
  Administered 2022-09-26: 7 [IU] via SUBCUTANEOUS
  Administered 2022-09-26 – 2022-09-27 (×2): 3 [IU] via SUBCUTANEOUS
  Administered 2022-09-28 (×2): 4 [IU] via SUBCUTANEOUS
  Administered 2022-09-28: 3 [IU] via SUBCUTANEOUS
  Administered 2022-09-29 – 2022-09-30 (×3): 7 [IU] via SUBCUTANEOUS
  Filled 2022-09-25: qty 0.2

## 2022-09-25 MED ORDER — SODIUM CHLORIDE 0.9 % IV SOLN
2.0000 g | Freq: Three times a day (TID) | INTRAVENOUS | Status: DC
  Administered 2022-09-25 – 2022-09-27 (×6): 2 g via INTRAVENOUS
  Filled 2022-09-25 (×6): qty 12.5

## 2022-09-25 MED ORDER — ALBUTEROL SULFATE (2.5 MG/3ML) 0.083% IN NEBU: 2.5000 mg | INHALATION_SOLUTION | RESPIRATORY_TRACT | Status: DC | PRN

## 2022-09-25 MED ORDER — OXYCODONE HCL 5 MG PO TABS
5.0000 mg | ORAL_TABLET | ORAL | Status: DC | PRN
  Administered 2022-09-25: 5 mg via ORAL
  Administered 2022-09-25 – 2022-09-28 (×12): 10 mg via ORAL
  Administered 2022-09-29: 5 mg via ORAL
  Administered 2022-09-29 – 2022-10-01 (×7): 10 mg via ORAL
  Filled 2022-09-25 (×15): qty 2
  Filled 2022-09-25: qty 1
  Filled 2022-09-25 (×5): qty 2

## 2022-09-25 MED ORDER — IPRATROPIUM-ALBUTEROL 0.5-2.5 (3) MG/3ML IN SOLN
3.0000 mL | Freq: Three times a day (TID) | RESPIRATORY_TRACT | Status: DC
  Administered 2022-09-25 – 2022-10-01 (×15): 3 mL via RESPIRATORY_TRACT
  Filled 2022-09-25 (×16): qty 3

## 2022-09-25 MED ORDER — DULOXETINE HCL 60 MG PO CPEP
60.0000 mg | ORAL_CAPSULE | Freq: Two times a day (BID) | ORAL | Status: DC
  Administered 2022-09-25 – 2022-10-01 (×12): 60 mg via ORAL
  Filled 2022-09-25 (×12): qty 1

## 2022-09-25 MED ORDER — VANCOMYCIN HCL 2000 MG/400ML IV SOLN
2000.0000 mg | Freq: Once | INTRAVENOUS | Status: AC
  Administered 2022-09-25: 2000 mg via INTRAVENOUS
  Filled 2022-09-25: qty 400

## 2022-09-25 MED ORDER — OXCARBAZEPINE 300 MG PO TABS
600.0000 mg | ORAL_TABLET | Freq: Three times a day (TID) | ORAL | Status: DC
  Administered 2022-09-25 – 2022-10-01 (×18): 600 mg via ORAL
  Filled 2022-09-25 (×18): qty 2

## 2022-09-25 MED ORDER — ACETAMINOPHEN 500 MG PO TABS
1000.0000 mg | ORAL_TABLET | Freq: Once | ORAL | Status: AC
  Administered 2022-09-25: 1000 mg via ORAL
  Filled 2022-09-25: qty 2

## 2022-09-25 MED ORDER — FOLIC ACID 1 MG PO TABS
1.0000 mg | ORAL_TABLET | Freq: Every day | ORAL | Status: DC
  Administered 2022-09-25 – 2022-10-01 (×7): 1 mg via ORAL
  Filled 2022-09-25 (×9): qty 1

## 2022-09-25 MED ORDER — ACETAMINOPHEN 325 MG PO TABS
650.0000 mg | ORAL_TABLET | Freq: Four times a day (QID) | ORAL | Status: DC | PRN
  Administered 2022-09-25 – 2022-09-29 (×3): 650 mg via ORAL
  Filled 2022-09-25 (×5): qty 2

## 2022-09-25 MED ORDER — TRAZODONE HCL 50 MG PO TABS
75.0000 mg | ORAL_TABLET | Freq: Every day | ORAL | Status: DC
  Administered 2022-09-25 – 2022-09-30 (×6): 75 mg via ORAL
  Filled 2022-09-25 (×6): qty 2

## 2022-09-25 MED ORDER — VANCOMYCIN HCL 1250 MG/250ML IV SOLN
1250.0000 mg | Freq: Two times a day (BID) | INTRAVENOUS | Status: DC
  Administered 2022-09-25 – 2022-09-27 (×4): 1250 mg via INTRAVENOUS
  Filled 2022-09-25 (×4): qty 250

## 2022-09-25 MED ORDER — GUAIFENESIN ER 600 MG PO TB12
600.0000 mg | ORAL_TABLET | Freq: Two times a day (BID) | ORAL | Status: DC
  Administered 2022-09-25 – 2022-10-01 (×12): 600 mg via ORAL
  Filled 2022-09-25 (×12): qty 1

## 2022-09-25 NOTE — ED Notes (Signed)
Pt is aware that a urine sample is needed, urinal at bedside. 

## 2022-09-25 NOTE — Plan of Care (Signed)
  Problem: Activity: Goal: Ability to tolerate increased activity will improve Outcome: Progressing   Problem: Clinical Measurements: Goal: Ability to maintain a body temperature in the normal range will improve Outcome: Progressing   Problem: Respiratory: Goal: Ability to maintain a clear airway will improve Outcome: Progressing   Problem: Education: Goal: Ability to describe self-care measures that may prevent or decrease complications (Diabetes Survival Skills Education) will improve Outcome: Progressing   Problem: Coping: Goal: Ability to adjust to condition or change in health will improve Outcome: Progressing   Problem: Fluid Volume: Goal: Ability to maintain a balanced intake and output will improve Outcome: Progressing   Problem: Health Behavior/Discharge Planning: Goal: Ability to manage health-related needs will improve Outcome: Progressing   Problem: Metabolic: Goal: Ability to maintain appropriate glucose levels will improve Outcome: Progressing   Problem: Education: Goal: Knowledge of risk factors and measures for prevention of condition will improve Outcome: Progressing   Problem: Coping: Goal: Psychosocial and spiritual needs will be supported Outcome: Progressing   Problem: Respiratory: Goal: Will maintain a patent airway Outcome: Progressing   Problem: Education: Goal: Knowledge of General Education information will improve Description: Including pain rating scale, medication(s)/side effects and non-pharmacologic comfort measures Outcome: Progressing   Problem: Clinical Measurements: Goal: Will remain free from infection Outcome: Progressing Goal: Respiratory complications will improve Outcome: Progressing   Problem: Activity: Goal: Risk for activity intolerance will decrease Outcome: Progressing   Problem: Coping: Goal: Level of anxiety will decrease Outcome: Progressing   Problem: Pain Managment: Goal: General experience of comfort  will improve Outcome: Progressing   Problem: Education: Goal: Individualized Educational Video(s) Outcome: Adequate for Discharge   Problem: Health Behavior/Discharge Planning: Goal: Ability to identify and utilize available resources and services will improve Outcome: Adequate for Discharge   Problem: Nutritional: Goal: Maintenance of adequate nutrition will improve Outcome: Adequate for Discharge   Problem: Skin Integrity: Goal: Risk for impaired skin integrity will decrease Outcome: Adequate for Discharge   Problem: Tissue Perfusion: Goal: Adequacy of tissue perfusion will improve Outcome: Adequate for Discharge   Problem: Health Behavior/Discharge Planning: Goal: Ability to manage health-related needs will improve Outcome: Adequate for Discharge   Problem: Safety: Goal: Ability to remain free from injury will improve Outcome: Adequate for Discharge   Problem: Skin Integrity: Goal: Risk for impaired skin integrity will decrease Outcome: Adequate for Discharge   Problem: Respiratory: Goal: Ability to maintain adequate ventilation will improve Outcome: Not Progressing

## 2022-09-25 NOTE — Progress Notes (Signed)
Pharmacy Antibiotic Note  Mario [name] is a 72 y.o. male admitted on 09/25/2022 with pneumonia.  Pharmacy has been consulted for vancomycin dosing.  Plan: Vancomycin 2000 mg IV x1 given in ED then vancomycin 1250 mg IV q12h ( AUC 514, SCr 1, wt 137.4 kg)  Cefepime 2 gr IV q8h (per MD)  Remdesivir 200 mg IV x 1 then 100 mg daily x 2 ( per MD)   Height: 6\' 2"  (188 cm) Weight: (!) 137.4 kg (303 lb) IBW/kg (Calculated) : 82.2  Temp (24hrs), Avg:101.8 F (38.8 C), Min:101.8 F (38.8 C), Max:101.8 F (38.8 C)  Recent Labs  Lab 09/21/22 0014 09/21/22 1955 09/24/22 0450 09/25/22 1000 09/25/22 1013 09/25/22 1231  WBC 8.9 10.6* 9.6 11.6*  --   --   CREATININE 0.98 0.95 1.20 1.00  --   --   LATICACIDVEN  --   --   --   --  1.6 1.1    Estimated Creatinine Clearance: 101.4 mL/min (by C-G formula based on SCr of 1 mg/dL).    Allergies  Allergen Reactions   Demerol [Meperidine] Nausea And Vomiting and Other (See Comments)    Made the patient "violently sick"   Zocor [Simvastatin] Nausea And Vomiting and Other (See Comments)    Made him very jittery, also   Beet [Beta Vulgaris] Nausea And Vomiting   Collard Greens [Wild Lettuce Extract (Lactuca Virosa)] Other (See Comments)    Throw up   Liver Nausea And Vomiting    Antimicrobials this admission: 7/28 vancomycin  >> 7/28 cefepime >> 7/28 remdesivir >> ( 7/30)   Dose adjustments this admission:   Microbiology results: 7/28 BCx >>  7/28 COVID  >> positive   Thank you for allowing pharmacy to be a part of this patient's care.  Adalberto Cole, PharmD, BCPS 09/25/2022 1:27 PM

## 2022-09-25 NOTE — H&P (Signed)
History and Physical    Patient: Mario [name]:[id number] DOB: April 29, 1950 DOA: 09/25/2022 DOS: the patient was seen and examined on 09/25/2022 PCP: [name]  Patient coming from: Home  Chief Complaint:  Chief Complaint  Patient presents with   Shortness of Breath   HPI: Mario [name] is a 72 y.o. male with medical history significant of anxiety, bronchitis, COPD, radiation therapy for left lung cancer, hypertension, CAD, history of MI, OSA, history of suicide attempt, tension pneumothorax, uveitis who has being in the emergency department multiple times this week due to multiple issues, but today he is coming in with progressively worse dyspnea and generalized weakness.  No travel history or sick contacts to his knowledge.  He has not felt fever, no night sweats or chills.  His appetite has been decreased, but is fair. He denied fever sore throat, wheezing or hemoptysis.  No chest pain, palpitations, diaphoresis, PND, orthopnea or pitting edema of the lower extremities.  No abdominal pain, nausea, emesis, diarrhea, constipation, melena or hematochezia.  No flank pain, dysuria, frequency or hematuria.  No polyuria, polydipsia, polyphagia or blurred vision.   Lab work: His urinalysis was unremarkable.  i-STAT lactic acid was normal.  CBC showed a white count 11.6, hemoglobin 13.4 g/dL platelets 956.  PT and INR were normal.  Coronavirus PCR was positive.  Troponin, BNP and lipase were normal.  CMP showed a glucose of 122 mg/dL, the rest of the CMP measurements are normal after calcium correction.  Imaging: 2 view chest radiograph showing no improvement in left greater than right lower lobe consolidation since 09/21/2022 and evidence of small new pleural effusions since then.  Underlying emphysema.  No other acute cardiopulmonary abnormality.  CT abdomen/pelvis with contrast done yesterday with no acute findings in the abdomen or pelvis.  No evidence of metastatic disease.   Diverticulosis without diverticulitis.  Large stool burden again seen throughout the colon.  Stable tiny left lateral basilar pneumothorax.  Stable small loculated pleural of fluid collection in the lateral left lung base, most likely representing hematoma given several adjacent left rib fractures.  Stable left lower lobe airspace disease.  ED course: Initial vital signs were temperature 101.8 F, pulse 123, respiration 20, BP 158/98 mmHg O2 sat 94% on room air.  The patient received normal saline 1000 mL bolus, acetaminophen 1000 mg p.o. x 1, vancomycin and Zosyn.   Review of Systems: As mentioned in the history of present illness. All other systems reviewed and are negative. Past Medical History:  Diagnosis Date   Anxiety    Bronchitis    COPD (chronic obstructive pulmonary disease) (HCC)    Depression    History of radiation therapy    Left lung- 10/05/20-10/15/20- Dr. Antony Blackbird   Hypertension    lung ca 09/2020   MI (myocardial infarction) [facility])    ????   On home oxygen therapy    4L/[place]   OSA (obstructive sleep apnea)    Suicide attempt (HCC)    Tension pneumothorax 06/27/2016   Uveitis    Past Surgical History:  Procedure Laterality Date   BIOPSY  07/03/2021   Procedure: BIOPSY;  Surgeon: Kathi Der, MD;  Location: [facility];  Service: Gastroenterology;;   BRONCHIAL BIOPSY  07/30/2020   Procedure: BRONCHIAL BIOPSIES;  Surgeon: Josephine Igo, DO;  Location: [facility];  Service: Pulmonary;;   BRONCHIAL BRUSHINGS  07/30/2020   Procedure: BRONCHIAL BRUSHINGS;  Surgeon: Josephine Igo, DO;  Location: [facility];  Service:  Pulmonary;;   BRONCHIAL NEEDLE ASPIRATION BIOPSY  07/30/2020   Procedure: BRONCHIAL NEEDLE ASPIRATION BIOPSIES;  Surgeon: Josephine Igo, DO;  Location: [facility];  Service: Pulmonary;;   BRONCHIAL WASHINGS  07/30/2020   Procedure: BRONCHIAL WASHINGS;  Surgeon: Josephine Igo, DO;  Location: [facility];  Service: Pulmonary;;   CHEST TUBE  INSERTION Left 06/27/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     ESOPHAGOGASTRODUODENOSCOPY N/A 07/03/2021   Procedure: ESOPHAGOGASTRODUODENOSCOPY (EGD);  Surgeon: Kathi Der, MD;  Location: [facility];  Service: Gastroenterology;  Laterality: N/A;   IR PERC PLEURAL DRAIN W/INDWELL CATH W/IMG GUIDE  08/04/2021   IR REMOVAL OF PLURAL CATH W/CUFF  09/01/2021   SKIN CANCER EXCISION     VIDEO BRONCHOSCOPY WITH ENDOBRONCHIAL NAVIGATION Left 07/30/2020   Procedure: VIDEO BRONCHOSCOPY WITH ENDOBRONCHIAL NAVIGATION;  Surgeon: Josephine Igo, DO;  Location: [facility];  Service: Pulmonary;  Laterality: Left;   Social History:  reports that he quit smoking about 6 years ago. His smoking use included cigarettes. He started smoking about 41 years ago. He has a 35 pack-year smoking history. He has been exposed to tobacco smoke. He has never used smokeless tobacco. He reports that he does not drink alcohol and does not use drugs.  Allergies  Allergen Reactions   Demerol [Meperidine] Nausea And Vomiting and Other (See Comments)    Made the patient "violently sick"   Zocor [Simvastatin] Nausea And Vomiting and Other (See Comments)    Made him very jittery, also   Beet [Beta Vulgaris] Nausea And Vomiting   Collard Greens [Wild Lettuce Extract (Lactuca Virosa)] Other (See Comments)    Throw up   Liver Nausea And Vomiting    Family History  Problem Relation Age of Onset   Dementia Father     Prior to Admission medications   Medication Sig Start Date End Date Taking? Authorizing Provider  albuterol (PROVENTIL) (2.5 MG/3ML) 0.083% nebulizer solution Inhale 3 mLs (2.5 mg total) by nebulization every 4 (four) hours as needed for wheezing or shortness of breath. 08/04/20 10/02/22  Rolly Salter, MD  albuterol (VENTOLIN HFA) 108 (90 Base) MCG/ACT inhaler Inhale 2 puffs into the lungs every 4 (four) hours as needed for wheezing or shortness of breath. 11/04/21   Glade Lloyd, MD  apixaban (ELIQUIS) 5 MG TABS tablet Take 5  mg by mouth 2 (two) times daily.    [provider]  buPROPion (WELLBUTRIN XL) 300 MG 24 hr tablet Take 300 mg by mouth every morning. 02/05/20   [provider]  busPIRone (BUSPAR) 15 MG tablet Take 15 mg by mouth daily.    [provider]  Carboxymethylcellulose Sod PF 0.5 % SOLN Place 1 drop into both eyes 4 (four) times daily as needed (dry eyes).    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    [provider]  cyclobenzaprine (FLEXERIL) 10 MG tablet Take 30 mg by mouth at bedtime.    [provider]  cycloSPORINE (RESTASIS) 0.05 % ophthalmic emulsion Place 1 drop into both eyes every 12 (twelve) hours.    [provider]  diltiazem (CARDIZEM CD) 180 MG 24 hr capsule Take 180 mg by mouth daily.    [provider]  docusate sodium (COLACE) 100 MG capsule Take 100 mg by mouth 2 (two) times daily.    [provider]  DULoxetine (CYMBALTA) 60 MG capsule Take 1 capsule (60  mg total) by mouth 2 (two) times daily. 10/04/21 09/21/22  Earney Navy, NP  fluticasone (FLONASE) 50 MCG/ACT nasal spray Place 2 sprays into both nostrils daily.     [provider]  folic acid (FOLVITE) 1 MG tablet Take 1 mg by mouth daily.    [provider]  gabapentin (NEURONTIN) 300 MG capsule Take 1 capsule (300 mg total) by mouth 3 (three) times daily for 10 days. 09/02/22 09/21/22  Glyn Ade, MD  guaiFENesin (MUCINEX) 600 MG 12 hr tablet Take 600 mg by mouth 2 (two) times daily as needed for cough.    [provider]  lidocaine (HM LIDOCAINE PATCH) 4 % Place 1 patch onto the skin daily. 09/02/22   Glyn Ade, MD  lisinopril (ZESTRIL) 20 MG tablet Take 20 mg by mouth daily.    [provider]  lurasidone (LATUDA) 40 MG TABS tablet Take 40 mg by mouth every evening. After supper    [provider]  Melatonin 3 MG CAPS Take 6 mg by mouth at bedtime as needed (sleep).    [provider]  metFORMIN (GLUCOPHAGE) 850 MG tablet Take 850 mg by mouth daily with breakfast.    [provider]  methocarbamol (ROBAXIN) 750 MG tablet Take 1 tablet (750 mg total) by mouth every 6 (six) hours as needed for muscle spasms. 07/04/22   Burnadette Pop, MD  methotrexate (RHEUMATREX) 2.5 MG tablet Take 15 mg by mouth once a week. Caution:Chemotherapy. Protect from light. Tuesdays    [provider]  Mometasone Furoate 200 MCG/ACT AERO Inhale 2 puffs into the lungs 2 (two) times daily. 11/04/21   Glade Lloyd, MD  mycophenolate (CELLCEPT) 250 MG capsule Take 1,500 mg by mouth 2 (two) times daily.    [provider]  Nutritional Supplements (ENSURE ORIGINAL) LIQD Take 1 Bottle by mouth daily as needed (in between meals).    [provider]  ondansetron (ZOFRAN-ODT) 4 MG disintegrating tablet Take 1 tablet (4 mg total) by mouth every 8 (eight) hours as needed for nausea or vomiting. 10/04/21   Earney Navy, NP  oxcarbazepine (TRILEPTAL) 600 MG tablet Take 600 mg by mouth See admin instructions. Take 1 tablet BID and at bedtime    [provider]  oxyCODONE (OXY IR/ROXICODONE) 5 MG immediate release tablet Take 1-2 tablets (5-10 mg total) by mouth every 4 (four) hours as needed for severe pain. 09/10/22   Smitty Knudsen, PA-C  pantoprazole (PROTONIX) 40 MG tablet Take 40 mg by mouth daily. Take on an empty stomach    [provider]  polyethylene glycol powder (GLYCOLAX/MIRALAX) 17 GM/SCOOP powder Take 17 g by mouth daily.    [provider]  prednisoLONE acetate (PRED FORTE) 1 % ophthalmic suspension Place 1 drop into both eyes See admin instructions. 1 drop every hour while awake    [provider]  psyllium (METAMUCIL SMOOTH TEXTURE) 58.6 % powder Take 1 packet by mouth 3 (three) times daily. 06/29/22   Elayne Snare  K, DO  senna-docusate (SENOKOT-S) 8.6-50 MG tablet Take 1 tablet by mouth daily. 06/29/22   Elayne Snare K, DO  tamsulosin (FLOMAX) 0.4 MG CAPS capsule Take 0.8 mg by mouth daily.    [provider]  Tiotropium Bromide-Olodaterol 2.5-2.5 MCG/ACT AERS Inhale 2 each into the lungs every morning. 2 puffs    [provider]  traZODone (DESYREL) 50 MG tablet Take 75 mg by mouth at bedtime.    [provider]  Physical Exam: Vitals:   09/25/22 0902 09/25/22 0915 09/25/22 0947 09/25/22 1030  BP:  (!) 158/98  137/78  Pulse:  (!) 123  (!) 114  Resp:  20  16  Temp:   (!) 101.8 F (38.8 C)   TempSrc:   Rectal   SpO2:  94%  93%  Weight: (!) 137.4 kg     Height: 6\' 2"  (1.88 m)      Physical Exam Vitals and nursing note reviewed.  Constitutional:      General: He is awake. He is not in acute distress.    Appearance: He is obese. He is ill-appearing. He is not toxic-appearing.  HENT:     Head: Normocephalic.     Nose: No rhinorrhea.     Mouth/Throat:     Mouth: Mucous membranes are moist.  Eyes:     General: No scleral icterus.    Pupils: Pupils are equal, round, and reactive to light.  Neck:     Vascular: No JVD.  Cardiovascular:     Rate and Rhythm: Regular rhythm. Tachycardia present.     Heart sounds: S1 normal and S2 normal.  Pulmonary:     Effort: Tachypnea present.     Breath sounds: Examination of the right-lower field reveals decreased breath sounds. Examination of the left-lower field reveals decreased breath sounds. Decreased breath sounds, rhonchi and rales present. No wheezing.  Musculoskeletal:     Cervical back: Neck supple.     Right lower leg: No edema.     Left lower leg: No edema.  Skin:    General: Skin is warm and dry.  Neurological:     General: No focal deficit present.     Mental Status: He is alert and oriented to person, place, and time.  Psychiatric:        Mood and Affect: Mood normal.        Behavior: Behavior normal.  Behavior is cooperative.    Data Reviewed:  Results are pending, will review when available.  Assessment and Plan: Principal Problem:   Acute respiratory insufficiency  In the setting of:   COVID-19 virus infection With superimposed:   Left lower lobe pneumonia   COPD with acute exacerbation (HCC)   Non-small cell cancer of left lung (HCC) Admit to telemetry/inpatient. Continue home oxygen. Increase flow as needed. CPAP at bedtime. May use BiPAP as needed. Bronchodilators as needed. Incentive spirometry while awake. Flutter valve exercises. Antitussives as needed. Dexamethasone 6 mg IVP daily. Remdesivir per pharmacy. Continue vancomycin per pharmacy. Begin cefepime 2 g IVPB every 8 hours.  Active Problems:   DM2 (diabetes mellitus, type 2) (HCC) Carbohydrate modified diet. CBG monitoring with [place] SS. Check hemoglobin A1c. Continue metformin 850 mg p.o. daily with breakfast.    Hypertension Continue diltiazem 180 mg p.o. daily. Continue lisinopril 20 mg p.o. daily.    History of Pulmonary embolism (HCC) No longer on apixaban. Lovenox SQ nightly for DVT prophylaxis.    BPH (benign prostatic hyperplasia) Continue tamsulosin 0.8 mg p.o. daily.    Stage 3a chronic kidney disease (HCC) Monitor renal function and electrolytes. On lisinopril as above.    Major depressive disorder, recurrent episode (HCC) Continue bupropion 300 mg p.o. daily. Continue buspirone 15 mg p.o. daily. Continue duloxetine 60 mg p.o. twice daily. Continue oxcarbazepine 600 mg p.o. 3 times daily. Continue trazodone 50 mg p.o. daily.    OSA (obstructive sleep apnea) Continue CPAP at bedtime.    HLA B27 (HLA B27 positive)  Ankylosing spondylitis lumbar region [facility]) Hold mycophenolate for now. Continue oxycodone for pain control as needed.     Advance Care Planning:   Code Status: Full Code   Consults:   Family Communication:   Severity of Illness: The appropriate patient  status for this patient is INPATIENT. Inpatient status is judged to be reasonable and necessary in order to provide the required intensity of service to ensure the patient's safety. The patient's presenting symptoms, physical exam findings, and initial radiographic and laboratory data in the context of their chronic comorbidities is felt to place them at high risk for further clinical deterioration. Furthermore, it is not anticipated that the patient will be medically stable for discharge from the hospital within 2 midnights of admission.   * I certify that at the point of admission it is my clinical judgment that the patient will require inpatient hospital care spanning beyond 2 midnights from the point of admission due to high intensity of service, high risk for further deterioration and high frequency of surveillance required.*  Author: Bobette Mo, MD 09/25/2022 11:23 AM  For on call review [email or web address].   This document was prepared using Dragon voice recognition software and may contain some unintended transcription errors.

## 2022-09-25 NOTE — ED Triage Notes (Signed)
BIB EMS with [facility], 4L 02 continuous. Pt discharged yesterday. Pt states he woke this morning and could not get up due to weakness. #20 L AC. 144/74-124-95% on 4L-24 CBG -209, increased urination over last few days.

## 2022-09-25 NOTE — Progress Notes (Signed)
   09/25/22 2250  BiPAP/CPAP/SIPAP  $ Non-Invasive Home Ventilator  Initial  $ Face Mask Medium Yes  BiPAP/CPAP/SIPAP Pt Type Adult  BiPAP/CPAP/SIPAP DREAMSTATIOND  Mask Type Full face mask  Mask Size Medium  Respiratory Rate 20 breaths/min  Flow Rate 4 lpm  Patient Home Equipment No  Auto Titrate Yes (per pt 9/4 auto with 4L)  CPAP/SIPAP surface wiped down Yes  BiPAP/CPAP /SiPAP Vitals  Pulse Rate 100  Resp (!) 21  SpO2 94 %  Bilateral Breath Sounds Diminished  MEWS Score/Color  MEWS Score 1  MEWS Score Color Green

## 2022-09-25 NOTE — ED Notes (Signed)
ED TO INPATIENT HANDOFF REPORT  Name/Age/Gender Mario [name] 72 y.o. male  Code Status    Code Status Orders  (From admission, onward)           Start     Ordered   09/25/22 1127  Full code  Continuous       Question:  By:  Answer:  Consent: discussion documented in EHR   09/25/22 1202           Code Status History     Date Active Date Inactive Code Status Order ID Comments User Context   07/02/2022 1956 07/05/2022 1810 Full Code [id number]  Orland Mustard, MD ED   11/03/2021 0057 11/04/2021 1755 Full Code [id number]  Briscoe Deutscher, MD ED   10/08/2021 1333 10/09/2021 1719 Full Code [id number]  Al Decant, PA-C ED   10/01/2021 1932 10/04/2021 1934 Full Code [id number]  Franne Forts, DO ED   09/30/2021 2345 10/01/2021 1822 Full Code [id number]  Nira Conn, MD ED   09/23/2021 0029 09/26/2021 2237 DNR [id number]  Marinda Elk, MD ED   08/25/2021 1020 09/07/2021 1653 DNR [id number]  Joylene Draft, NP Inpatient   08/24/2021 0402 08/25/2021 1020 Full Code [id number]  Briscoe Deutscher, MD ED   08/21/2021 0031 08/23/2021 1845 Full Code [id number]  Briscoe Deutscher, MD ED   08/07/2021 0956 08/11/2021 0427 Full Code [id number]  Drema Dallas, MD ED   08/03/2021 1851 08/06/2021 0348 Full Code [id number]  Joseph Art, DO Inpatient   06/17/2021 0046 07/06/2021 1726 Full Code [id number]  Benita Gutter T, DO ED   06/04/2021 0139 06/11/2021 1609 Full Code [id number]  Howerter, Chaney Born, DO ED   05/21/2021 2320 05/25/2021 0549 Full Code [id number]  Rehman, Areeg N, DO ED   05/20/2021 0150 05/21/2021 2025 Full Code [id number]  Rehman, Areeg N, DO ED   02/04/2021 0105 02/04/2021 2154 Full Code [id number]  Charlsie Quest, MD ED   01/09/2021 0612 01/10/2021 2012 Full Code [id number]  John Giovanni, MD Inpatient   09/04/2020 0923 09/05/2020 1724 Full Code [id number]  Teddy Spike, DO ED   07/26/2020 1449 08/05/2020 1913 Full Code [id number]  Teddy Spike, DO Inpatient   04/04/2020 0033 04/07/2020 2141 Full Code  [id number]  Eduard Clos, MD ED   01/04/2020 1615 01/06/2020 1904 Full Code [id number]  Teddy Spike, DO Inpatient   12/26/2019 1039 12/28/2019 1810 Full Code [id number]  Jonah Blue, MD ED   07/12/2019 0557 07/15/2019 1904 Full Code [id number]  Marily Memos, MD ED   07/03/2019 0016 07/03/2019 2011 Full Code [id number]  Bobette Mo, MD ED   07/03/2019 0016 07/03/2019 0016 Full Code [id number]  Bobette Mo, MD ED   06/23/2019 0425 06/25/2019 2122 Full Code [id number]  Thalia Party, DO ED   10/10/2018 2021 10/13/2018 1641 Full Code [id number]  Ward, Chase Picket, PA-C ED   04/30/2018 1759 05/01/2018 1714 Full Code [id number]  Dietrich Pates, PA-C ED   02/20/2018 1759 02/21/2018 1452 Full Code [id number]  Sabas Sous, MD ED   07/16/2017 0309 07/21/2017 1734 Full Code [id number]  Hillary Bow, DO ED   09/17/2016 2107 09/18/2016 2055 Full Code [id number]  Freddrick March, MD ED   06/27/2016 1523 07/07/2016 1718 Full Code [id number]  Ardelle Balls, PA-C ED   05/23/2014 2356 05/26/2014 1656 Full Code [id number]  Pearson Grippe, MD Inpatient   01/21/2013 2202 01/23/2013 2202 Full Code [id number]  Ivonne Andrew  [facility]   01/12/2013 0020 01/12/2013 0641 Full Code [id number]  Arthor Captain, PA-C ED   01/10/2013 0122 01/11/2013 2122 Full Code [id number]  Eduard Clos, MD Inpatient       Home/SNF/Other Home  Chief Complaint COVID-19 virus infection [U07.1]  Level of Care/Admitting Diagnosis ED Disposition     ED Disposition  Admit   Condition  --   Comment  Hospital Area: [facility] [[id number]]  Level of Care: Progressive [102]  Admit to Progressive based on following criteria: RESPIRATORY PROBLEMS hypoxemic/hypercapnic respiratory failure that is responsive to NIPPV (BiPAP) or High Flow Nasal Cannula (6-80 lpm). Frequent assessment/intervention, no > Q2 hrs < Q4 hrs, to maintain oxygenation and pulmonary hygiene.  May admit patient to [facility] or [facility] if  equivalent level of care is available:: No  Covid Evaluation: Asymptomatic - no recent exposure (last 10 days) testing not required  Diagnosis: COVID-19 virus infection [[id number]]  Admitting Physician: Bobette Mo [[id number]]  Attending Physician: Bobette Mo [[id number]]  Certification:: I certify this patient will need inpatient services for at least 2 midnights  Estimated Length of Stay: 2          Medical History Past Medical History:  Diagnosis Date   Ankylosing spondylitis lumbar region (HCC) 09/25/2022   Anxiety    Bronchitis    COPD (chronic obstructive pulmonary disease) (HCC)    Depression    History of radiation therapy    Left lung- 10/05/20-10/15/20- Dr. Antony Blackbird   Hypertension    lung ca 09/2020   MI (myocardial infarction) [facility])    ????   On home oxygen therapy    4L/min [place]   OSA (obstructive sleep apnea)    Suicide attempt (HCC)    Tension pneumothorax 06/27/2016   Uveitis     Allergies Allergies  Allergen Reactions   Demerol [Meperidine] Nausea And Vomiting and Other (See Comments)    Made the patient "violently sick"   Zocor [Simvastatin] Nausea And Vomiting and Other (See Comments)    Made him very jittery, also   Beet [Beta Vulgaris] Nausea And Vomiting   Collard Greens [Wild Lettuce Extract (Lactuca Virosa)] Other (See Comments)    Throw up   Liver Nausea And Vomiting    IV Location/Drains/Wounds Patient Lines/Drains/Airways Status     Active Line/Drains/Airways     Name Placement date Placement time Site Days   Peripheral IV 09/25/22 20 G 1" Right Antecubital 09/25/22  1001  Antecubital  less than 1   Peripheral IV 09/25/22 20 G 1" Anterior;Distal;Left;Upper Arm 09/25/22  0855  Arm  less than 1            Labs/Imaging Results for orders placed or performed during the hospital encounter of 09/25/22 (from the past 48 hour(s))  Comprehensive metabolic panel     Status: Abnormal   Collection Time: 09/25/22 10:00 AM   Result Value Ref Range   Sodium 135 135 - 145 mmol/L   Potassium 4.4 3.5 - 5.1 mmol/L   Chloride 100 98 - 111 mmol/L   CO2 25 22 - 32 mmol/L   Glucose, Bld 122 (H) 70 - 99 mg/dL    Comment: Glucose reference range applies only to samples taken after fasting for at least 8 hours.   BUN 15 8 - 23 mg/dL   Creatinine, Ser 9.32 0.61 - 1.24 mg/dL   Calcium 8.6 (L) 8.9 - 10.3 mg/dL   Total Protein 7.4 6.5 - 8.1 g/dL  Albumin 3.7 3.5 - 5.0 g/dL   AST 19 15 - 41 U/L   ALT 16 0 - 44 U/L   Alkaline Phosphatase 87 38 - 126 U/L   Total Bilirubin 0.4 0.3 - 1.2 mg/dL   GFR, Estimated >46 >96 mL/min    Comment: (NOTE) Calculated using the CKD-EPI Creatinine Equation (2021)    Anion gap 10 5 - 15    Comment: Performed at [facility], [address], [place], [place] [address]  CBC with Differential     Status: Abnormal   Collection Time: 09/25/22 10:00 AM  Result Value Ref Range   WBC 11.6 (H) 4.0 - 10.5 K/uL   RBC 4.40 4.22 - 5.81 MIL/uL   Hemoglobin 13.4 13.0 - 17.0 g/dL   HCT 41.3 24.4 - 01.0 %   MCV 96.1 80.0 - 100.0 fL   MCH 30.5 26.0 - 34.0 pg   MCHC 31.7 30.0 - 36.0 g/dL   RDW 27.2 53.6 - 64.4 %   Platelets 297 150 - 400 K/uL   nRBC 0.0 0.0 - 0.2 %   Neutrophils Relative % 89 %   Neutro Abs 10.2 (H) 1.7 - 7.7 K/uL   Lymphocytes Relative 4 %   Lymphs Abs 0.5 (L) 0.7 - 4.0 K/uL   Monocytes Relative 7 %   Monocytes Absolute 0.8 0.1 - 1.0 K/uL   Eosinophils Relative 0 %   Eosinophils Absolute 0.0 0.0 - 0.5 K/uL   Basophils Relative 0 %   Basophils Absolute 0.0 0.0 - 0.1 K/uL   Immature Granulocytes 0 %   Abs Immature Granulocytes 0.04 0.00 - 0.07 K/uL    Comment: Performed at [facility], [address] [place], [place] [address]  Protime-INR     Status: None   Collection Time: 09/25/22 10:00 AM  Result Value Ref Range   Prothrombin Time 13.7 11.4 - 15.2 seconds   INR 1.0 0.8 - 1.2    Comment: (NOTE) INR goal varies based on device  and disease states. Performed at [facility], [address], [place], [place] [address]   Lipase, blood     Status: None   Collection Time: 09/25/22 10:00 AM  Result Value Ref Range   Lipase 35 11 - 51 U/L    Comment: Performed at [facility], [address], [place], [place] [address]  Troponin I (High Sensitivity)     Status: None   Collection Time: 09/25/22 10:00 AM  Result Value Ref Range   Troponin I (High Sensitivity) 15 <18 ng/L    Comment: (NOTE) Elevated high sensitivity troponin I (hsTnI) values and significant  changes across serial measurements may suggest ACS but many other  chronic and acute conditions are known to elevate hsTnI results.  Refer to the "Links" section for chest pain algorithms and additional  guidance. Performed at [facility], [address] [place], [place] [address]   Brain natriuretic peptide     Status: None   Collection Time: 09/25/22 10:00 AM  Result Value Ref Range   B Natriuretic Peptide 20.8 0.0 - 100.0 pg/mL    Comment: Performed at [facility], [address] [place], [place] [address]  Magnesium     Status: None   Collection Time: 09/25/22 10:00 AM  Result Value Ref Range   Magnesium 2.2 1.7 - 2.4 mg/dL    Comment: Performed at [facility], [address], [place], [place] [address]  I-Stat Lactic Acid, ED     Status: None   Collection Time: 09/25/22 10:13 AM  Result Value Ref Range   Lactic Acid, Venous 1.6 0.5 - 1.9 mmol/L  Urinalysis, w/ Reflex to Culture (Infection Suspected) -Urine, Unspecified Source     Status: None   Collection Time: 09/25/22 10:30 AM  Result Value Ref Range   Specimen Source URINE, CATHETERIZED    Color, Urine YELLOW YELLOW   APPearance CLEAR CLEAR   Specific Gravity, Urine 1.023 1.005 - 1.030   pH 6.0 5.0 - 8.0   Glucose, UA NEGATIVE NEGATIVE mg/dL   Hgb urine dipstick NEGATIVE NEGATIVE   Bilirubin  Urine NEGATIVE NEGATIVE   Ketones, ur NEGATIVE NEGATIVE mg/dL   Protein, ur NEGATIVE NEGATIVE mg/dL   Nitrite NEGATIVE NEGATIVE   Leukocytes,Ua NEGATIVE NEGATIVE   RBC / HPF 0-5 0 - 5 RBC/hpf   WBC, UA 0-5 0 - 5 WBC/hpf    Comment:        Reflex urine culture not performed if WBC <=10, OR if Squamous epithelial cells >5. If Squamous epithelial cells >5 suggest recollection.    Bacteria, UA NONE SEEN NONE SEEN   Squamous Epithelial / HPF 0-5 0 - 5 /HPF   Mucus PRESENT     Comment: Performed at [facility], [address], [place], [place] [address]  SARS Coronavirus 2 by RT PCR (hospital order, performed in [facility] hospital lab) *cepheid single result test* Anterior Nasal Swab     Status: Abnormal   Collection Time: 09/25/22 10:30 AM   Specimen: Anterior Nasal Swab  Result Value Ref Range   SARS Coronavirus 2 by RT PCR POSITIVE (A) NEGATIVE    Comment: (NOTE) SARS-CoV-2 target nucleic acids are DETECTED  SARS-CoV-2 RNA is generally detectable in upper respiratory specimens  during the acute phase of infection.  Positive results are indicative  of the presence of the identified virus, but do not rule out bacterial infection or co-infection with other pathogens not detected by the test.  Clinical correlation with patient history and  other diagnostic information is necessary to determine patient infection status.  The expected result is negative.  Fact Sheet for Patients:   RoadLapTop.co.za   Fact Sheet for Healthcare Providers:   http://kim-miller.com/    This test is not yet approved or cleared by the [place] FDA and  has been authorized for detection and/or diagnosis of SARS-CoV-2 by FDA under an Emergency Use Authorization (EUA).  This EUA will remain in effect (meaning this test can be used) for the duration of  the COVID-19 declaration under Section 564(b)(1)  of the Act, 21 U.S.C. section  360-bbb-3(b)(1), unless the authorization is terminated or revoked sooner.   Performed at [facility], [address] [place], [place] [address]    DG Chest 2 View  Result Date: 09/25/2022 CLINICAL DATA:  72 year old male with weakness, suspected sepsis. EXAM: CHEST - 2 VIEW COMPARISON:  Chest radiograph 09/21/2022 and earlier. FINDINGS: Emphysema with left greater than right lower lobe airspace opacity demonstrated on CTA 4 days ago. Semi upright AP and lateral views of the chest now at 0932 hours. Left lung base consolidation persists. Streaky right lung base opacity has not significantly changed. Small bilateral pleural effusions now suspected and new from the recent CTA. Stable cardiac size and mediastinal contours. Visualized tracheal air column is within normal limits. No pulmonary edema. No pneumothorax. No acute osseous abnormality identified. Paucity of bowel gas in the visible  abdomen. IMPRESSION: 1. No improvement in left greater than right lower lobe consolidation since 09/21/2022, and evidence of small new pleural effusions since that time. 2. Underlying emphysema. No other acute cardiopulmonary abnormality. Electronically Signed   By: Odessa Fleming M.D.   On: 09/25/2022 09:53   CT ABDOMEN PELVIS W CONTRAST  Result Date: 09/24/2022 CLINICAL DATA:  Acute abdominal pain. Dizziness and near syncopal episode. Lung carcinoma. * Tracking Code: BO * EXAM: CT ABDOMEN AND PELVIS WITH CONTRAST TECHNIQUE: Multidetector CT imaging of the abdomen and pelvis was performed using the standard protocol following bolus administration of intravenous contrast. RADIATION DOSE REDUCTION: This exam was performed according to the departmental dose-optimization program which includes automated exposure control, adjustment of the mA and/or kV according to patient size and/or use of iterative reconstruction technique. CONTRAST:  OMNIPAQUE IOHEXOL 300 MG/ML  SOLN COMPARISON:  07/02/2022 FINDINGS:  Lower Chest: Left lower lobe airspace disease is unchanged. Tiny left lateral basilar pneumothorax remains stable. A small loculated pleural fluid collection is again seen in the lateral left lung base which measures 5.5 x 2.7 cm. This most likely represents hematoma given several adjacent left rib fractures. Hepatobiliary: No suspicious hepatic masses identified. Small cyst again seen in caudate lobe. Gallbladder is unremarkable. No evidence of biliary ductal dilatation. Pancreas:  No mass or inflammatory changes. Spleen: Within normal limits in size and appearance. Adrenals/Urinary Tract: No suspicious masses identified. No evidence of ureteral calculi or hydronephrosis. Stomach/Bowel: No evidence of obstruction, inflammatory process or abnormal fluid collections. Diverticulosis is seen mainly involving the descending and sigmoid colon, however there is no evidence of diverticulitis. Large stool burden again seen throughout the colon. Vascular/Lymphatic: No pathologically enlarged lymph nodes. No acute vascular findings. Reproductive:  No mass or other significant abnormality. Other:  None. Musculoskeletal:  No suspicious bone lesions identified. IMPRESSION: No acute findings within abdomen or pelvis. No evidence of metastatic disease. Colonic diverticulosis, without radiographic evidence of diverticulitis. Large stool burden again seen throughout the colon. Recommend clinical correlation for possible constipation. Stable tiny left lateral basilar pneumothorax. Stable small loculated pleural fluid collection in lateral left lung base, most likely representing hematoma given several adjacent left rib fractures. Stable left lower lobe airspace disease. Electronically Signed   By: Danae Orleans M.D.   On: 09/24/2022 10:19    Pending Labs Unresulted Labs (From admission, onward)     Start     Ordered   09/26/22 0500  HIV Antibody (routine testing w rflx)  (HIV Antibody (Routine testing w reflex) panel)  Tomorrow  morning,   R        09/25/22 1202   09/26/22 0500  CBC  Tomorrow morning,   R        09/25/22 1202   09/26/22 0500  Comprehensive metabolic panel  Tomorrow morning,   R        09/25/22 1202   09/26/22 0500  Procalcitonin  Tomorrow morning,   R       References:    Procalcitonin Lower Respiratory Tract Infection AND Sepsis Procalcitonin Algorithm   09/25/22 1204   09/25/22 1205  Procalcitonin  Add-on,   AD       References:    Procalcitonin Lower Respiratory Tract Infection AND Sepsis Procalcitonin Algorithm   09/25/22 1204   09/25/22 1204  Hemoglobin A1c  Add-on,   AD       Comments: To assess prior glycemic control    09/25/22 1203   09/25/22 1203  Expectorated Sputum Assessment  w Gram Stain, Rflx to Resp Cult  (COPD / Pneumonia / Cellulitis / Lower Extremity Wound)  Once,   R        09/25/22 1202   09/25/22 1203  Strep pneumoniae urinary antigen  (COPD / Pneumonia / Cellulitis / Lower Extremity Wound)  Once,   R        09/25/22 1202   09/25/22 0911  Culture, blood (Routine x 2)  BLOOD CULTURE X 2,   R (with STAT occurrences)      09/25/22 0910            Vitals/Pain Today's Vitals   09/25/22 0902 09/25/22 0915 09/25/22 0947 09/25/22 1030  BP:  (!) 158/98  137/78  Pulse:  (!) 123  (!) 114  Resp:  20  16  Temp:   (!) 101.8 F (38.8 C)   TempSrc:   Rectal   SpO2:  94%  93%  Weight: (!) 303 lb (137.4 kg)     Height: 6\' 2"  (1.88 m)     PainSc: 5        Isolation Precautions Airborne and Contact precautions  Medications Medications  vancomycin (VANCOREADY) IVPB 2000 mg/400 mL (2,000 mg Intravenous New Bag/Given 09/25/22 1116)  acetaminophen (TYLENOL) tablet 650 mg (has no administration in time range)    Or  acetaminophen (TYLENOL) suppository 650 mg (has no administration in time range)  ondansetron (ZOFRAN) tablet 4 mg (has no administration in time range)    Or  ondansetron (ZOFRAN) injection 4 mg (has no administration in time range)  ipratropium-albuterol  (DUONEB) 0.5-2.5 (3) MG/3ML nebulizer solution 3 mL (has no administration in time range)  insulin aspart (novoLOG) injection 0-20 Units (has no administration in time range)  remdesivir 200 mg in sodium chloride 0.9% 250 mL IVPB (has no administration in time range)    Followed by  remdesivir 100 mg in sodium chloride 0.9 % 100 mL IVPB (has no administration in time range)  dexamethasone (DECADRON) injection 6 mg (has no administration in time range)  ceFEPIme (MAXIPIME) 2 g in sodium chloride 0.9 % 100 mL IVPB (has no administration in time range)  sodium chloride 0.9 % bolus 1,000 mL (0 mLs Intravenous Stopped 09/25/22 1216)  acetaminophen (TYLENOL) tablet 1,000 mg (1,000 mg Oral Given 09/25/22 1026)  piperacillin-tazobactam (ZOSYN) IVPB 3.375 g (0 g Intravenous Stopped 09/25/22 1116)    Mobility walks with person assist

## 2022-09-25 NOTE — ED Provider Notes (Signed)
[place] EMERGENCY DEPARTMENT AT [facility] Provider Note   CSN: [id number] Arrival date & time: 09/25/22  4098     History  Chief Complaint  Patient presents with   Shortness of Breath    Mario [name] is a 72 y.o. male.  72 yo M with a chief complaint of fatigue on exertion.  He noticed this last night.  He tried to get up and move around but was unable to.  Felt very fatigued and short of breath.  He feels fine at rest.  He denies cough congestion or fever denies chest pain or pressure.  Denies nausea vomiting or diarrhea.  Feels like he has been eating and drinking okay.  He was actually seen in the emergency department yesterday.  He is not sure why they did CT imaging.  He tells me they told him that he had likely reflux disease.   Shortness of Breath      Home Medications Prior to Admission medications   Medication Sig Start Date End Date Taking? Authorizing Provider  albuterol (PROVENTIL) (2.5 MG/3ML) 0.083% nebulizer solution Inhale 3 mLs (2.5 mg total) by nebulization every 4 (four) hours as needed for wheezing or shortness of breath. 08/04/20 10/02/22  Rolly Salter, MD  albuterol (VENTOLIN HFA) 108 (90 Base) MCG/ACT inhaler Inhale 2 puffs into the lungs every 4 (four) hours as needed for wheezing or shortness of breath. 11/04/21   Glade Lloyd, MD  apixaban (ELIQUIS) 5 MG TABS tablet Take 5 mg by mouth 2 (two) times daily.    [provider]  buPROPion (WELLBUTRIN XL) 300 MG 24 hr tablet Take 300 mg by mouth every morning. 02/05/20   [provider]  busPIRone (BUSPAR) 15 MG tablet Take 15 mg by mouth daily.    [provider]  Carboxymethylcellulose Sod PF 0.5 % SOLN Place 1 drop into both eyes 4 (four) times daily as needed (dry eyes).    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    [provider]   cyclobenzaprine (FLEXERIL) 10 MG tablet Take 30 mg by mouth at bedtime.    [provider]  cycloSPORINE (RESTASIS) 0.05 % ophthalmic emulsion Place 1 drop into both eyes every 12 (twelve) hours.    [provider]  diltiazem (CARDIZEM CD) 180 MG 24 hr capsule Take 180 mg by mouth daily.    [provider]  docusate sodium (COLACE) 100 MG capsule Take 100 mg by mouth 2 (two) times daily.    [provider]  DULoxetine (CYMBALTA) 60 MG capsule Take 1 capsule (60 mg total) by mouth 2 (two) times daily. 10/04/21 09/21/22  Earney Navy, NP  fluticasone (FLONASE) 50 MCG/ACT nasal spray Place 2 sprays into both nostrils daily.     [provider]  folic acid (FOLVITE) 1 MG tablet Take 1 mg by mouth daily.    [provider]  gabapentin (NEURONTIN) 300 MG capsule Take 1 capsule (300 mg total) by mouth 3 (three) times daily for 10 days. 09/02/22 09/21/22  Glyn Ade, MD  guaiFENesin (MUCINEX) 600 MG 12 hr tablet Take 600 mg by mouth 2 (two) times daily as needed for cough.    [provider]  lidocaine (HM LIDOCAINE PATCH) 4 % Place 1 patch onto the skin daily. 09/02/22   Glyn Ade, MD  lisinopril (ZESTRIL) 20 MG tablet Take 20 mg  by mouth daily.    [provider]  lurasidone (LATUDA) 40 MG TABS tablet Take 40 mg by mouth every evening. After supper    [provider]  Melatonin 3 MG CAPS Take 6 mg by mouth at bedtime as needed (sleep).    [provider]  metFORMIN (GLUCOPHAGE) 850 MG tablet Take 850 mg by mouth daily with breakfast.    [provider]  methocarbamol (ROBAXIN) 750 MG tablet Take 1 tablet (750 mg total) by mouth every 6 (six) hours as needed for muscle spasms. 07/04/22   Burnadette Pop, MD  methotrexate (RHEUMATREX) 2.5 MG tablet Take 15 mg by mouth once a week. Caution:Chemotherapy. Protect from light. Tuesdays    [provider]  Mometasone Furoate 200 MCG/ACT AERO  Inhale 2 puffs into the lungs 2 (two) times daily. 11/04/21   Glade Lloyd, MD  mycophenolate (CELLCEPT) 250 MG capsule Take 1,500 mg by mouth 2 (two) times daily.    [provider]  Nutritional Supplements (ENSURE ORIGINAL) LIQD Take 1 Bottle by mouth daily as needed (in between meals).    [provider]  ondansetron (ZOFRAN-ODT) 4 MG disintegrating tablet Take 1 tablet (4 mg total) by mouth every 8 (eight) hours as needed for nausea or vomiting. 10/04/21   Earney Navy, NP  oxcarbazepine (TRILEPTAL) 600 MG tablet Take 600 mg by mouth See admin instructions. Take 1 tablet BID and at bedtime    [provider]  oxyCODONE (OXY IR/ROXICODONE) 5 MG immediate release tablet Take 1-2 tablets (5-10 mg total) by mouth every 4 (four) hours as needed for severe pain. 09/10/22   Smitty Knudsen, PA-C  pantoprazole (PROTONIX) 40 MG tablet Take 40 mg by mouth daily. Take on an empty stomach    [provider]  polyethylene glycol powder (GLYCOLAX/MIRALAX) 17 GM/SCOOP powder Take 17 g by mouth daily.    [provider]  prednisoLONE acetate (PRED FORTE) 1 % ophthalmic suspension Place 1 drop into both eyes See admin instructions. 1 drop every hour while awake    [provider]  psyllium (METAMUCIL SMOOTH TEXTURE) 58.6 % powder Take 1 packet by mouth 3 (three) times daily. 06/29/22   Elayne Snare K, DO  senna-docusate (SENOKOT-S) 8.6-50 MG tablet Take 1 tablet by mouth daily. 06/29/22   Elayne Snare K, DO  tamsulosin (FLOMAX) 0.4 MG CAPS capsule Take 0.8 mg by mouth daily.    [provider]  Tiotropium Bromide-Olodaterol 2.5-2.5 MCG/ACT AERS Inhale 2 each into the lungs every morning. 2 puffs    [provider]  traZODone (DESYREL) 50 MG tablet Take 75 mg by mouth at bedtime.    [provider]      Allergies    Demerol [meperidine], Zocor [simvastatin], Beet [beta vulgaris], Collard greens [wild lettuce extract  (lactuca virosa)], and Liver    Review of Systems   Review of Systems  Respiratory:  Positive for shortness of breath.     Physical Exam Updated Vital Signs BP 137/78   Pulse (!) 114   Temp (!) 101.8 F (38.8 C) (Rectal)   Resp 16   Ht 6\' 2"  (1.88 m)   Wt (!) 137.4 kg   SpO2 93%   BMI 38.90 kg/m  Physical Exam Vitals and nursing note reviewed.  Constitutional:      Appearance: He is well-developed.  HENT:     Head: Normocephalic and atraumatic.  Eyes:     Pupils: Pupils are equal, round, and reactive to  light.  Neck:     Vascular: No JVD.  Cardiovascular:     Rate and Rhythm: Normal rate and regular rhythm.     Heart sounds: No murmur heard.    No friction rub. No gallop.  Pulmonary:     Effort: No respiratory distress.     Breath sounds: No wheezing.     Comments: Diminished lung sounds in the right lung base.  No obvious wheezes or tachypnea Abdominal:     General: There is no distension.     Tenderness: There is no abdominal tenderness. There is no guarding or rebound.  Musculoskeletal:        General: Normal range of motion.     Cervical back: Normal range of motion and neck supple.  Skin:    Coloration: Skin is not pale.     Findings: No rash.  Neurological:     Mental Status: He is alert and oriented to person, place, and time.  Psychiatric:        Behavior: Behavior normal.     ED Results / Procedures / Treatments   Labs (all labs ordered are listed, but only abnormal results are displayed) Labs Reviewed  SARS CORONAVIRUS 2 BY RT PCR - Abnormal; Notable for the following components:      Result Value   SARS Coronavirus 2 by RT PCR POSITIVE (*)    All other components within normal limits  COMPREHENSIVE METABOLIC PANEL - Abnormal; Notable for the following components:   Glucose, Bld 122 (*)    Calcium 8.6 (*)    All other components within normal limits  CBC WITH DIFFERENTIAL/PLATELET - Abnormal; Notable for the following components:   WBC 11.6  (*)    Neutro Abs 10.2 (*)    Lymphs Abs 0.5 (*)    All other components within normal limits  CULTURE, BLOOD (ROUTINE X 2)  CULTURE, BLOOD (ROUTINE X 2)  PROTIME-INR  URINALYSIS, W/ REFLEX TO CULTURE (INFECTION SUSPECTED)  LIPASE, BLOOD  BRAIN NATRIURETIC PEPTIDE  I-STAT CG4 LACTIC ACID, ED  I-STAT CG4 LACTIC ACID, ED  TROPONIN I (HIGH SENSITIVITY)  TROPONIN I (HIGH SENSITIVITY)    EKG None  Radiology DG Chest 2 View  Result Date: 09/25/2022 CLINICAL DATA:  72 year old male with weakness, suspected sepsis. EXAM: CHEST - 2 VIEW COMPARISON:  Chest radiograph 09/21/2022 and earlier. FINDINGS: Emphysema with left greater than right lower lobe airspace opacity demonstrated on CTA 4 days ago. Semi upright AP and lateral views of the chest now at 0932 hours. Left lung base consolidation persists. Streaky right lung base opacity has not significantly changed. Small bilateral pleural effusions now suspected and new from the recent CTA. Stable cardiac size and mediastinal contours. Visualized tracheal air column is within normal limits. No pulmonary edema. No pneumothorax. No acute osseous abnormality identified. Paucity of bowel gas in the visible abdomen. IMPRESSION: 1. No improvement in left greater than right lower lobe consolidation since 09/21/2022, and evidence of small new pleural effusions since that time. 2. Underlying emphysema. No other acute cardiopulmonary abnormality. Electronically Signed   By: Odessa Fleming M.D.   On: 09/25/2022 09:53   CT ABDOMEN PELVIS W CONTRAST  Result Date: 09/24/2022 CLINICAL DATA:  Acute abdominal pain. Dizziness and near syncopal episode. Lung carcinoma. * Tracking Code: BO * EXAM: CT ABDOMEN AND PELVIS WITH CONTRAST TECHNIQUE: Multidetector CT imaging of the abdomen and pelvis was performed using the standard protocol following bolus administration of intravenous contrast. RADIATION DOSE REDUCTION: This exam was  performed according to the departmental  dose-optimization program which includes automated exposure control, adjustment of the mA and/or kV according to patient size and/or use of iterative reconstruction technique. CONTRAST:  OMNIPAQUE IOHEXOL 300 MG/ML  SOLN COMPARISON:  07/02/2022 FINDINGS: Lower Chest: Left lower lobe airspace disease is unchanged. Tiny left lateral basilar pneumothorax remains stable. A small loculated pleural fluid collection is again seen in the lateral left lung base which measures 5.5 x 2.7 cm. This most likely represents hematoma given several adjacent left rib fractures. Hepatobiliary: No suspicious hepatic masses identified. Small cyst again seen in caudate lobe. Gallbladder is unremarkable. No evidence of biliary ductal dilatation. Pancreas:  No mass or inflammatory changes. Spleen: Within normal limits in size and appearance. Adrenals/Urinary Tract: No suspicious masses identified. No evidence of ureteral calculi or hydronephrosis. Stomach/Bowel: No evidence of obstruction, inflammatory process or abnormal fluid collections. Diverticulosis is seen mainly involving the descending and sigmoid colon, however there is no evidence of diverticulitis. Large stool burden again seen throughout the colon. Vascular/Lymphatic: No pathologically enlarged lymph nodes. No acute vascular findings. Reproductive:  No mass or other significant abnormality. Other:  None. Musculoskeletal:  No suspicious bone lesions identified. IMPRESSION: No acute findings within abdomen or pelvis. No evidence of metastatic disease. Colonic diverticulosis, without radiographic evidence of diverticulitis. Large stool burden again seen throughout the colon. Recommend clinical correlation for possible constipation. Stable tiny left lateral basilar pneumothorax. Stable small loculated pleural fluid collection in lateral left lung base, most likely representing hematoma given several adjacent left rib fractures. Stable left lower lobe airspace disease.  Electronically Signed   By: Danae Orleans M.D.   On: 09/24/2022 10:19    Procedures Procedures    Medications Ordered in ED Medications  vancomycin (VANCOREADY) IVPB 2000 mg/400 mL (2,000 mg Intravenous New Bag/Given 09/25/22 1116)  sodium chloride 0.9 % bolus 1,000 mL (1,000 mLs Intravenous New Bag/Given 09/25/22 1029)  acetaminophen (TYLENOL) tablet 1,000 mg (1,000 mg Oral Given 09/25/22 1026)  piperacillin-tazobactam (ZOSYN) IVPB 3.375 g (0 g Intravenous Stopped 09/25/22 1116)    ED Course/ Medical Decision Making/ A&P                             Medical Decision Making Amount and/or Complexity of Data Reviewed Labs: ordered. Radiology: ordered.  Risk OTC drugs. Prescription drug management. Decision regarding hospitalization.   72 yo M with a chief complaints of fatigue.  When the patient tried to get up yesterday afternoon he was unable to.  Continues to not be able to get up and move around today.  On my record review the patient has a history of COPD and lung cancer and is on 4 L of oxygen at all times.  He appears very comfortable in the bed on his 4 L.  He is found to have a temperature of 101.8 rectally.  Chest x-ray independently interpreted by me with complex consolidation that had been seen on prior imaging.  Radiology read with likely new pleural effusions.  With fever and difficulty breathing will start on antibiotics.  He is on methotrexate chronically will start on broad-spectrum.  IV fluids.  UA negative infection, lactate normal.  Troponin and BNP negative.  Will discuss with medicine.  .The patients results and plan were reviewed and discussed.   Any x-rays performed were independently reviewed by myself.   Differential diagnosis were considered with the presenting HPI.  Medications  vancomycin (VANCOREADY) IVPB 2000 mg/400  mL (2,000 mg Intravenous New Bag/Given 09/25/22 1116)  sodium chloride 0.9 % bolus 1,000 mL (1,000 mLs Intravenous New Bag/Given 09/25/22  1029)  acetaminophen (TYLENOL) tablet 1,000 mg (1,000 mg Oral Given 09/25/22 1026)  piperacillin-tazobactam (ZOSYN) IVPB 3.375 g (0 g Intravenous Stopped 09/25/22 1116)    Vitals:   09/25/22 0902 09/25/22 0915 09/25/22 0947 09/25/22 1030  BP:  (!) 158/98  137/78  Pulse:  (!) 123  (!) 114  Resp:  20  16  Temp:   (!) 101.8 F (38.8 C)   TempSrc:   Rectal   SpO2:  94%  93%  Weight: (!) 137.4 kg     Height: 6\' 2"  (1.88 m)       Final diagnoses:  HCAP (healthcare-associated pneumonia)  COVID-19    Admission/ observation were discussed with the admitting physician, patient and/or family and they are comfortable with the plan.          Final Clinical Impression(s) / ED Diagnoses Final diagnoses:  HCAP (healthcare-associated pneumonia)  COVID-19    Rx / DC Orders ED Discharge Orders     None         Melene Plan, DO 09/25/22 1132

## 2022-09-26 DIAGNOSIS — U071 COVID-19: Secondary | ICD-10-CM | POA: Diagnosis not present

## 2022-09-26 LAB — GLUCOSE, CAPILLARY
Glucose-Capillary: 116 mg/dL — ABNORMAL HIGH (ref 70–99)
Glucose-Capillary: 135 mg/dL — ABNORMAL HIGH (ref 70–99)
Glucose-Capillary: 201 mg/dL — ABNORMAL HIGH (ref 70–99)
Glucose-Capillary: 101 mg/dL — ABNORMAL HIGH (ref 70–99)

## 2022-09-26 MED ORDER — ENOXAPARIN SODIUM 60 MG/0.6ML IJ SOSY
60.0000 mg | PREFILLED_SYRINGE | INTRAMUSCULAR | Status: DC
  Administered 2022-09-26 – 2022-09-30 (×5): 60 mg via SUBCUTANEOUS
  Filled 2022-09-26 (×5): qty 0.6

## 2022-09-26 NOTE — Progress Notes (Signed)
PROGRESS NOTE    Mario [name]  ZOX:[id number] DOB: Jul 28, 1950 DOA: 09/25/2022 PCP: [name]   Brief Narrative:  This 72 yrs old Male with medical history significant of anxiety, bronchitis, COPD, radiation therapy for left lung cancer, hypertension, CAD, history of MI, OSA, history of suicide attempt, tension pneumothorax, uveitis who has being in the emergency department multiple times this week due to multiple issues, but today he came with progressively worsening dyspnea and generalized weakness.  CT chest abdomen and pelvis was done which shows no acute findings in the abdomen and pelvis.  No evidence of metastatic disease.  Large stool burden again seen throughout the colon.  Patient was febrile on arrival in the ED.  Patient was admitted for sepsis and started on empiric antibiotics. He is found to be COVID-positive.  Assessment & Plan:   Principal Problem:   COVID-19 virus infection Active Problems:   COPD with acute exacerbation (HCC)   DM2 (diabetes mellitus, type 2) (HCC)   Non-small cell cancer of left lung (HCC)   Hypertension   History of pulmonary embolism   BPH (benign prostatic hyperplasia)   Stage 3a chronic kidney disease (HCC)   Major depressive disorder, recurrent episode (HCC)   OSA (obstructive sleep apnea)   HLA B27 (HLA B27 positive)   Ankylosing spondylitis lumbar region (HCC)   Acute respiratory insufficiency  Acute hypoxic respiratory failure sec. to COVID 19 pneumonia: Left lower lobe pneumonia, non-small cell lung cancer: Patient presented with worsening shortness of breath He is found to have COVID-pneumonia. Continue supplemental oxygen and wean as tolerated,  Continue CPAP at bedtime, may use BiPAP if needed. Continue Incentive spirometry, flutter wall exercises,  antitussives as needed. Continue dexamethasone 6 mg IV daily Continue remdesivir X 3  days. Started on vancomycin and cefepime for pneumonia.  Type 2 diabetes: Carb  modified diet. Hold metformin Continue regular insulin sliding scale.  Essential hypertension: Continue Cardizem 180 mg daily Continue lisinopril 20 mg daily  History of pulmonary embolism: No longer on Eliquis. Continue Lovenox for DVT prophylaxis  BPH: Continue Flomax 0.8 mg daily  CKD stage IIIa: Serum creatinine at baseline.  Continue lisinopril.  Major depressive disorder: Continue bupropion, buspirone, duloxetine, oxcarbazepine and trazodone.  OSA Continue CPAP at night  HLAB27+ Ankylosing spondylitis lumbar region: Continue oxycodone for pain control.   DVT prophylaxis: Lovenox Code Status: Full code Family Communication: No family at bed side Disposition Plan:    Status is: Inpatient Remains inpatient appropriate because: Admitted for acute hypoxic respiratory failure likely secondary to COVID and bacterial pneumonia started on antibiotics and remdesivir   Consultants:  None  Procedures: None  Antimicrobials:  Anti-infectives (From admission, onward)    Start     Dose/Rate Route Frequency Ordered Stop   09/26/22 1000  remdesivir 100 mg in sodium chloride 0.9 % 100 mL IVPB       Placed in "Followed by" Linked Group   100 mg 200 mL/hr over 30 Minutes Intravenous Daily 09/25/22 1203 09/28/22 0959   09/25/22 2200  vancomycin (VANCOREADY) IVPB 1250 mg/250 mL        1,250 mg 166.7 mL/hr over 90 Minutes Intravenous Every 12 hours 09/25/22 1328     09/25/22 1800  ceFEPIme (MAXIPIME) 2 g in sodium chloride 0.9 % 100 mL IVPB        2 g 200 mL/hr over 30 Minutes Intravenous Every 8 hours 09/25/22 1204     09/25/22 1500  remdesivir 200 mg in sodium chloride  0.9% 250 mL IVPB       Placed in "Followed by" Linked Group   200 mg 580 mL/hr over 30 Minutes Intravenous Once 09/25/22 1203 09/25/22 1551   09/25/22 1030  vancomycin (VANCOREADY) IVPB 2000 mg/400 mL        2,000 mg 200 mL/hr over 120 Minutes Intravenous  Once 09/25/22 1005 09/25/22 1358   09/25/22 1015   piperacillin-tazobactam (ZOSYN) IVPB 3.375 g        3.375 g 100 mL/hr over 30 Minutes Intravenous  Once 09/25/22 1005 09/25/22 1116      Subjective: Patient was seen and examined at bedside.  Overnight events noted.   Patient reports doing much better.  He denies any chest pain or shortness of breath or dizziness.  Objective: Vitals:   09/25/22 2250 09/26/22 0655 09/26/22 0920 09/26/22 1031  BP:  122/70  (!) 127/53  Pulse: 100 98  96  Resp: (!) 21   (!) 21  Temp:  98.5 F (36.9 C)    TempSrc:  Oral    SpO2: 94%  (!) 83% 93%  Weight:      Height:        Intake/Output Summary (Last 24 hours) at 09/26/2022 1440 Last data filed at 09/26/2022 1245 Gross per 24 hour  Intake 2000 ml  Output 2000 ml  Net 0 ml   Filed Weights   09/25/22 0902  Weight: (!) 137.4 kg    Examination:  General exam: Appears calm and comfortable, not in any acute distress.  Deconditioned Respiratory system: Clear to auscultation. Respiratory effort normal. RR 15 Cardiovascular system: S1 & S2 heard, RRR. No JVD, murmurs, rubs, gallops or clicks. Gastrointestinal system: Abdomen is non distended, soft and nontender. Normal bowel sounds heard. Central nervous system: Alert and oriented x 3. No focal neurological deficits. Extremities: No edema, No cyanosis, No clubbing  Skin: No rashes, lesions or ulcers Psychiatry: Judgement and insight appear normal. Mood & affect appropriate.     Data Reviewed: I have personally reviewed following labs and imaging studies  CBC: Recent Labs  Lab 09/21/22 0014 09/21/22 1955 09/24/22 0450 09/25/22 1000 09/26/22 0404  WBC 8.9 10.6* 9.6 11.6* 8.2  NEUTROABS 5.6 7.9* 5.9 10.2*  --   HGB 13.5 13.7 13.6 13.4 12.3*  HCT 40.8 41.6 43.5 42.3 37.6*  MCV 94.4 93.9 97.1 96.1 95.9  PLT 293 327 320 297 276   Basic Metabolic Panel: Recent Labs  Lab 09/21/22 0014 09/21/22 1955 09/24/22 0450 09/25/22 1000 09/26/22 0404  NA 135 137 138 135 135  K 4.5 3.8 4.4 4.4  4.0  CL 101 101 101 100 101  CO2 25 22 27 25 25   GLUCOSE 109* 134* 107* 122* 127*  BUN 15 14 19 15 15   CREATININE 0.98 0.95 1.20 1.00 0.89  CALCIUM 8.9 9.2 9.0 8.6* 8.2*  MG  --   --   --  2.2  --    GFR: Estimated Creatinine Clearance: 113.9 mL/min (by C-G formula based on SCr of 0.89 mg/dL). Liver Function Tests: Recent Labs  Lab 09/21/22 1955 09/25/22 1000 09/26/22 0404  AST 19 19 17   ALT 15 16 15   ALKPHOS 95 87 71  BILITOT 0.6 0.4 0.4  PROT 7.6 7.4 7.0  ALBUMIN 3.7 3.7 3.4*   Recent Labs  Lab 09/25/22 1000  LIPASE 35   No results for input(s): "AMMONIA" in the last 168 hours. Coagulation Profile: Recent Labs  Lab 09/25/22 1000  INR 1.0   Cardiac Enzymes:  No results for input(s): "CKTOTAL", "CKMB", "CKMBINDEX", "TROPONINI" in the last 168 hours. BNP (last 3 results) No results for input(s): "PROBNP" in the last 8760 hours. HbA1C: Recent Labs    09/25/22 1431  HGBA1C 6.7*   CBG: Recent Labs  Lab 09/25/22 1649 09/26/22 0830 09/26/22 1139  GLUCAP 154* 116* 135*   Lipid Profile: No results for input(s): "CHOL", "HDL", "LDLCALC", "TRIG", "CHOLHDL", "LDLDIRECT" in the last 72 hours. Thyroid Function Tests: No results for input(s): "TSH", "T4TOTAL", "FREET4", "T3FREE", "THYROIDAB" in the last 72 hours. Anemia Panel: No results for input(s): "VITAMINB12", "FOLATE", "FERRITIN", "TIBC", "IRON", "RETICCTPCT" in the last 72 hours. Sepsis Labs: Recent Labs  Lab 09/25/22 1013 09/25/22 1214 09/25/22 1231 09/26/22 0404  PROCALCITON  --  <0.10  --  <0.10  LATICACIDVEN 1.6  --  1.1  --     Recent Results (from the past 240 hour(s))  Culture, blood (Routine x 2)     Status: None (Preliminary result)   Collection Time: 09/25/22 10:00 AM   Specimen: Right Antecubital; Blood  Result Value Ref Range Status   Specimen Description   Final    RIGHT ANTECUBITAL BLOOD Performed at [facility], [address], [place], [place] [address]    Special Requests    Final    BOTTLES DRAWN AEROBIC AND ANAEROBIC Blood Culture adequate volume Performed at [facility], [address] [place], [place] [address]    Culture   Final    NO GROWTH 1 DAY Performed at [facility], [address] [place], [place] [address]    Report Status PENDING  Incomplete  Culture, blood (Routine x 2)     Status: None (Preliminary result)   Collection Time: 09/25/22 10:12 AM   Specimen: BLOOD RIGHT ARM  Result Value Ref Range Status   Specimen Description   Final    BLOOD RIGHT ARM Performed at [facility], [address] [place], [place] [address]    Special Requests   Final    BOTTLES DRAWN AEROBIC AND ANAEROBIC Blood Culture results may not be optimal due to an excessive volume of blood received in culture bottles Performed at [facility], [address], [place], [place] [address]    Culture   Final    NO GROWTH 1 DAY Performed at [facility], [address] [place], [place] [address]    Report Status PENDING  Incomplete  SARS Coronavirus 2 by RT PCR (hospital order, performed in [facility] hospital lab) *cepheid single result test* Anterior Nasal Swab     Status: Abnormal   Collection Time: 09/25/22 10:30 AM   Specimen: Anterior Nasal Swab  Result Value Ref Range Status   SARS Coronavirus 2 by RT PCR POSITIVE (A) NEGATIVE Final    Comment: (NOTE) SARS-CoV-2 target nucleic acids are DETECTED  SARS-CoV-2 RNA is generally detectable in upper respiratory specimens  during the acute phase of infection.  Positive results are indicative  of the presence of the identified virus, but do not rule out bacterial infection or co-infection with other pathogens not detected by the test.  Clinical correlation with patient history and  other diagnostic information is necessary to determine patient infection status.  The expected result is negative.  Fact Sheet for Patients:    RoadLapTop.co.za   Fact Sheet for Healthcare Providers:   http://kim-miller.com/    This test is not yet approved or cleared by the [place] and  has been authorized for detection and/or diagnosis of SARS-CoV-2 by FDA under an Emergency Use Authorization (EUA).  This EUA will remain in effect (meaning this test can be used) for the duration of  the COVID-19 declaration under Section 564(b)(1)  of the Act, 21 U.S.C. section 360-bbb-3(b)(1), unless the authorization is terminated or revoked sooner.   Performed at [facility], [address] [place], [place] [address]    Radiology Studies: DG Chest 2 View  Result Date: 09/25/2022 CLINICAL DATA:  72 year old male with weakness, suspected sepsis. EXAM: CHEST - 2 VIEW COMPARISON:  Chest radiograph 09/21/2022 and earlier. FINDINGS: Emphysema with left greater than right lower lobe airspace opacity demonstrated on CTA 4 days ago. Semi upright AP and lateral views of the chest now at 0932 hours. Left lung base consolidation persists. Streaky right lung base opacity has not significantly changed. Small bilateral pleural effusions now suspected and new from the recent CTA. Stable cardiac size and mediastinal contours. Visualized tracheal air column is within normal limits. No pulmonary edema. No pneumothorax. No acute osseous abnormality identified. Paucity of bowel gas in the visible abdomen. IMPRESSION: 1. No improvement in left greater than right lower lobe consolidation since 09/21/2022, and evidence of small new pleural effusions since that time. 2. Underlying emphysema. No other acute cardiopulmonary abnormality. Electronically Signed   By: Odessa Fleming M.D.   On: 09/25/2022 09:53    Scheduled Meds:  buPROPion  300 mg Oral q morning   busPIRone  15 mg Oral Daily   cyclobenzaprine  10 mg Oral QHS   dexamethasone (DECADRON) injection  6 mg Intravenous Q24H   diltiazem  180 mg  Oral Daily   DULoxetine  60 mg Oral BID   enoxaparin (LOVENOX) injection  60 mg Subcutaneous Q24H   folic acid  1 mg Oral Daily   gabapentin  300 mg Oral TID   guaiFENesin  600 mg Oral BID   insulin aspart  0-20 Units Subcutaneous TID WC   ipratropium-albuterol  3 mL Nebulization TID   lidocaine  1 patch Transdermal Q24H   lisinopril  20 mg Oral Daily   lurasidone  40 mg Oral QPM   metFORMIN  850 mg Oral Q breakfast   oxcarbazepine  600 mg Oral TID   polyethylene glycol  17 g Oral Daily   tamsulosin  0.8 mg Oral Daily   traZODone  75 mg Oral QHS   Continuous Infusions:  ceFEPime (MAXIPIME) IV 2 g (09/26/22 0852)   remdesivir 100 mg in sodium chloride 0.9 % 100 mL IVPB 100 mg (09/26/22 1023)   vancomycin 1,250 mg (09/26/22 1135)     LOS: 1 day    Time spent: 50 mins    Willeen Niece, MD [facility]   If 7PM-7AM, please contact night-coverage

## 2022-09-26 NOTE — Plan of Care (Signed)
  Problem: Activity: Goal: Ability to tolerate increased activity will improve Outcome: Progressing   Problem: Clinical Measurements: Goal: Ability to maintain a body temperature in the normal range will improve Outcome: Progressing   Problem: Respiratory: Goal: Ability to maintain adequate ventilation will improve Outcome: Progressing Goal: Ability to maintain a clear airway will improve Outcome: Progressing   Problem: Coping: Goal: Ability to adjust to condition or change in health will improve Outcome: Progressing   Problem: Education: Goal: Knowledge of risk factors and measures for prevention of condition will improve Outcome: Progressing   Problem: Respiratory: Goal: Will maintain a patent airway Outcome: Progressing   Problem: Clinical Measurements: Goal: Will remain free from infection Outcome: Progressing Goal: Respiratory complications will improve Outcome: Progressing   Problem: Pain Managment: Goal: General experience of comfort will improve Outcome: Progressing   Problem: Safety: Goal: Ability to remain free from injury will improve Outcome: Progressing   Problem: Health Behavior/Discharge Planning: Goal: Ability to identify and utilize available resources and services will improve Outcome: Adequate for Discharge   Problem: Nutritional: Goal: Maintenance of adequate nutrition will improve Outcome: Adequate for Discharge Goal: Progress toward achieving an optimal weight will improve Outcome: Adequate for Discharge   Problem: Skin Integrity: Goal: Risk for impaired skin integrity will decrease Outcome: Adequate for Discharge   Problem: Tissue Perfusion: Goal: Adequacy of tissue perfusion will improve Outcome: Adequate for Discharge   Problem: Coping: Goal: Psychosocial and spiritual needs will be supported Outcome: Adequate for Discharge   Problem: Nutrition: Goal: Adequate nutrition will be maintained Outcome: Adequate for Discharge    Problem: Coping: Goal: Level of anxiety will decrease Outcome: Adequate for Discharge   Problem: Skin Integrity: Goal: Risk for impaired skin integrity will decrease Outcome: Adequate for Discharge

## 2022-09-26 NOTE — TOC Initial Note (Signed)
Transition of Care [facility]) - Initial/Assessment Note    Patient Details  Name: Mario [name] MRN: [id number] Date of Birth: 1950-10-13  Transition of Care [facility]) CM/SW Contact:    Larrie Kass, LCSW Phone Number: 09/26/2022, 3:32 PM  Clinical Narrative:                 Pt active with [facility], TOC will follow for d/c need.         Patient Goals and CMS Choice            Expected Discharge Plan and Services                                              Prior Living Arrangements/Services                       Activities of Daily Living Home Assistive Devices/Equipment: Environmental consultant (specify type), Cane (specify quad or straight) ADL Screening (condition at time of admission) Patient's cognitive ability adequate to safely complete daily activities?: Yes Is the patient deaf or have difficulty hearing?: No Does the patient have difficulty seeing, even when wearing glasses/contacts?: No Does the patient have difficulty concentrating, remembering, or making decisions?: No Patient able to express need for assistance with ADLs?: No Does the patient have difficulty dressing or bathing?: No Independently performs ADLs?: Yes (appropriate for developmental age) Does the patient have difficulty walking or climbing stairs?: Yes Weakness of Legs: Both Weakness of Arms/Hands: None  Permission Sought/Granted                  Emotional Assessment              Admission diagnosis:  HCAP (healthcare-associated pneumonia) [J18.9] COVID-19 virus infection [U07.1] COVID-19 [U07.1] Patient Active Problem List   Diagnosis Date Noted   COVID-19 virus infection 09/25/2022   Ankylosing spondylitis lumbar region (HCC) 09/25/2022   Acute respiratory insufficiency 09/25/2022   Vasculitis (HCC) 07/02/2022   Chronic pain 07/02/2022   Community acquired pneumonia 07/02/2022   history of Pulmonary embolism (HCC) 07/02/2022   Anxiety    Acute and chronic  respiratory failure with hypoxia (HCC) 11/02/2021   Adjustment disorder with depressed mood 10/09/2021   Acute pulmonary edema (HCC) 09/23/2021   BPH (benign prostatic hyperplasia) 09/23/2021   Fracture of ribs, two, left, sequela 08/24/2021   Pleural effusion, left 08/20/2021   Metastatic carcinoma (HCC) 08/07/2021   Non-small cell lung cancer, left (HCC) 08/07/2021   Acute on chronic respiratory failure with hypoxia (HCC) 08/07/2021   Rib pain 08/03/2021   History of falling 07/29/2021   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 07/29/2021   Encounter for antineoplastic chemotherapy 07/20/2021   Leg paresthesia 06/17/2021   Obesity, Class III, BMI 40-49.9 (morbid obesity) (HCC) 06/17/2021   Pneumonia 06/17/2021   Hypertension    Allergic rhinitis    History of pulmonary embolism    Hyponatremia 05/20/2021   Mood disorder (HCC) 02/03/2021   Non-small cell cancer of left lung (HCC) 09/21/2020   Lung nodule 07/30/2020   Mass of left lung    Constipation    COPD with acute exacerbation (HCC) 01/05/2020   Vertigo 12/26/2019   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   Stage 3a chronic kidney disease (HCC) 12/26/2019   Abnormal CT of the chest 12/26/2019   COPD (chronic  obstructive pulmonary disease) (HCC)    Hyperglycemia 06/23/2019   OSA (obstructive sleep apnea) 06/23/2019   Major depressive disorder, recurrent episode (HCC) 10/12/2018   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   Cellulitis of both feet 07/16/2017   DM2 (diabetes mellitus, type 2) (HCC) 07/16/2017   HLA B27 (HLA B27 positive) 07/16/2017   Hyperkalemia    Hypoxia    Dyspnea 05/23/2014   Malingering 01/21/2013   Tobacco abuse 01/11/2013   Obesity, unspecified 01/11/2013   Hypertension associated with diabetes (HCC) 01/10/2013   PCP:  [name] Pharmacy:   [facility] - [place], [place] - [address] [place] [address] Phone: [phone] Fax: [phone]     Social Determinants of Health (SDOH) Social History: SDOH Screenings   Food Insecurity: No Food Insecurity (09/25/2022)  Housing: Low Risk  (09/25/2022)  Transportation Needs: No Transportation Needs (09/25/2022)  Utilities: Not At Risk (09/25/2022)  Financial Resource Strain: Low Risk  (09/23/2020)  Social Connections: Unknown (09/09/2022)   Received from [facility]  Stress: No Stress Concern Present (09/23/2020)  Tobacco Use: Medium Risk (09/25/2022)   SDOH Interventions:     Readmission Risk Interventions    11/03/2021   11:50 AM 08/23/2021   11:48 AM 08/05/2021   11:02 AM  Readmission Risk Prevention Plan  Transportation Screening Complete Complete Complete  Medication Review Oceanographer) Complete Complete Complete  PCP or Specialist appointment within 3-5 days of discharge Complete Complete Complete  HRI or Home Care Consult Complete Complete Complete  SW Recovery Care/Counseling Consult Complete Complete Complete  Palliative Care Screening Not Applicable Not Applicable Not Applicable  Skilled Nursing Facility Not Applicable Not Applicable Not Applicable

## 2022-09-27 ENCOUNTER — Inpatient Hospital Stay (HOSPITAL_COMMUNITY): Payer: Non-veteran care

## 2022-09-27 DIAGNOSIS — U071 COVID-19: Secondary | ICD-10-CM | POA: Diagnosis not present

## 2022-09-27 LAB — GLUCOSE, CAPILLARY
Glucose-Capillary: 115 mg/dL — ABNORMAL HIGH (ref 70–99)
Glucose-Capillary: 127 mg/dL — ABNORMAL HIGH (ref 70–99)
Glucose-Capillary: 120 mg/dL — ABNORMAL HIGH (ref 70–99)
Glucose-Capillary: 247 mg/dL — ABNORMAL HIGH (ref 70–99)

## 2022-09-27 MED ORDER — CYCLOSPORINE 0.05 % OP EMUL
1.0000 [drp] | Freq: Two times a day (BID) | OPHTHALMIC | Status: DC
  Administered 2022-09-27 – 2022-10-01 (×9): 1 [drp] via OPHTHALMIC
  Filled 2022-09-27 (×9): qty 30

## 2022-09-27 NOTE — Progress Notes (Signed)
   09/27/22 2321  BiPAP/CPAP/SIPAP  Reason BIPAP/CPAP not in use Non-compliant (PT refusing cpap tonight.)

## 2022-09-27 NOTE — Progress Notes (Signed)
   09/27/22 0608  Assess: MEWS Score  Temp 98.6 F (37 C)  BP (!) 155/95  MAP (mmHg) 111  Pulse Rate (!) 124  Resp 20  SpO2 97 %  O2 Device Nasal Cannula  Assess: MEWS Score  MEWS Temp 0  MEWS Systolic 0  MEWS Pulse 2  MEWS RR 0  MEWS LOC 0  MEWS Score 2  MEWS Score Color Yellow  Assess: if the MEWS score is Yellow or Red  Were vital signs accurate and taken at a resting state? Yes  Does the patient meet 2 or more of the SIRS criteria? No  MEWS guidelines implemented  Yes, yellow  Treat  MEWS Interventions Considered administering scheduled or prn medications/treatments as ordered  Take Vital Signs  Increase Vital Sign Frequency  Yellow: Q2hr x1, continue Q4hrs until patient remains green for 12hrs  Escalate  MEWS: Escalate Yellow: Discuss with charge nurse and consider notifying provider and/or RRT  Notify: Charge Nurse/RN  Name of Charge Nurse/RN Notified Lauren H.,RN  Assess: SIRS CRITERIA  SIRS Temperature  0  SIRS Pulse 1  SIRS Respirations  0  SIRS WBC 0  SIRS Score Sum  1   Patient alert, PRN pain medication given. No s/s of respiratory distress, patient sitting at the edge of the bed. Bedalarm on and call light within reach.

## 2022-09-27 NOTE — Progress Notes (Signed)
PROGRESS NOTE    Mario [name]  GNF:[id number] DOB: [birth year] DOA: 09/25/2022 PCP: [name]   Brief Narrative:  This 72 yrs old Male with medical history significant of anxiety, bronchitis, COPD, radiation therapy for left lung cancer, hypertension, CAD, history of MI, OSA, history of suicide attempt, tension pneumothorax, uveitis who has being in the emergency department multiple times this week due to multiple issues, but today he came with progressively worsening dyspnea and generalized weakness.  CT chest abdomen and pelvis was done which shows no acute findings in the abdomen and pelvis.  No evidence of metastatic disease.  Large stool burden again seen throughout the colon.  Patient was febrile on arrival in the ED.  Patient was admitted for sepsis and started on empiric antibiotics. He is found to be COVID-positive.  Assessment & Plan:   Principal Problem:   COVID-19 virus infection Active Problems:   COPD with acute exacerbation (HCC)   DM2 (diabetes mellitus, type 2) (HCC)   Non-small cell cancer of left lung (HCC)   Hypertension   History of pulmonary embolism   BPH (benign prostatic hyperplasia)   Stage 3a chronic kidney disease (HCC)   Major depressive disorder, recurrent episode (HCC)   OSA (obstructive sleep apnea)   HLA B27 (HLA B27 positive)   Ankylosing spondylitis lumbar region (HCC)   Acute respiratory insufficiency  Acute hypoxic respiratory failure sec. to COVID 19 pneumonia: Left lower lobe pneumonia, non-small cell lung cancer: Patient presented with worsening shortness of breath He is found to have COVID-pneumonia. Continue supplemental oxygen and wean as tolerated,  Continue CPAP at bedtime, may use BiPAP if needed. Continue Incentive spirometry, flutter wall exercises,  antitussives as needed. Continue dexamethasone 6 mg IV daily Continue remdesivir X 3  days. Initiated on vancomycin and cefepime for suspected pneumonia. Procalcitonin  0.1.  Antibiotics discontinued.  Type 2 diabetes: Carb modified diet. Hold metformin Continue regular insulin sliding scale.  Essential hypertension: Continue Cardizem 180 mg daily Continue lisinopril 20 mg daily  History of pulmonary embolism: No longer on Eliquis. Continue Lovenox for DVT prophylaxis.  BPH: Continue Flomax 0.8 mg daily  CKD stage IIIa: Serum creatinine at baseline.  Continue lisinopril.  Major depressive disorder: Continue bupropion, buspirone, duloxetine, oxcarbazepine and trazodone.  OSA Continue CPAP at night  HLAB27+ Ankylosing spondylitis lumbar region: Continue oxycodone for pain control.   DVT prophylaxis: Lovenox Code Status: Full code Family Communication: No family at bed side Disposition Plan:    Status is: Inpatient Remains inpatient appropriate because: Admitted for acute hypoxic respiratory failure likely secondary to COVID and bacterial pneumonia started on antibiotics and remdesivir.  Antibiotics discontinued as procalcitonin 0.01.   Consultants:  None  Procedures: None  Antimicrobials:  Anti-infectives (From admission, onward)    Start     Dose/Rate Route Frequency Ordered Stop   09/26/22 1000  remdesivir 100 mg in sodium chloride 0.9 % 100 mL IVPB       Placed in "Followed by" Linked Group   100 mg 200 mL/hr over 30 Minutes Intravenous Daily 09/25/22 1203 09/27/22 1329   09/25/22 2200  vancomycin (VANCOREADY) IVPB 1250 mg/250 mL  Status:  Discontinued        1,250 mg 166.7 mL/hr over 90 Minutes Intravenous Every 12 hours 09/25/22 1328 09/27/22 1356   09/25/22 1800  ceFEPIme (MAXIPIME) 2 g in sodium chloride 0.9 % 100 mL IVPB  Status:  Discontinued        2 g 200 mL/hr over 30  Minutes Intravenous Every 8 hours 09/25/22 1204 09/27/22 1356   09/25/22 1500  remdesivir 200 mg in sodium chloride 0.9% 250 mL IVPB       Placed in "Followed by" Linked Group   200 mg 580 mL/hr over 30 Minutes Intravenous Once 09/25/22 1203  09/25/22 1551   09/25/22 1030  vancomycin (VANCOREADY) IVPB 2000 mg/400 mL        2,000 mg 200 mL/hr over 120 Minutes Intravenous  Once 09/25/22 1005 09/25/22 1358   09/25/22 1015  piperacillin-tazobactam (ZOSYN) IVPB 3.375 g        3.375 g 100 mL/hr over 30 Minutes Intravenous  Once 09/25/22 1005 09/25/22 1116      Subjective: Patient was seen and examined at bedside.  Overnight events noted.   Patient reports doing much better. He denies any chest pain or shortness of breath.   He still remains on 4 L of supplemental oxygen.   Objective: Vitals:   09/27/22 0609 09/27/22 0757 09/27/22 0825 09/27/22 0856  BP:  (!) 138/94    Pulse:  (!) 130 93   Resp: 20 (!) 22    Temp:  98 F (36.7 C)    TempSrc:  Oral    SpO2:  95%  92%  Weight:      Height:        Intake/Output Summary (Last 24 hours) at 09/27/2022 1357 Last data filed at 09/27/2022 1200 Gross per 24 hour  Intake 1545.02 ml  Output 4600 ml  Net -3054.98 ml   Filed Weights   09/25/22 0902  Weight: (!) 137.4 kg    Examination:  General exam: Appears comfortable, deconditioned, not in any acute distress. Respiratory system: CTA bilaterally . Respiratory effort normal. RR 14 Cardiovascular system: S1 & S2 heard, RRR. No JVD, murmurs, rubs, gallops or clicks. Gastrointestinal system: Abdomen is non distended, soft and nontender. Normal bowel sounds heard. Central nervous system: Alert and oriented x 3. No focal neurological deficits. Extremities: No edema, No cyanosis, No clubbing  Skin: No rashes, lesions or ulcers Psychiatry: Judgement and insight appear normal. Mood & affect appropriate.     Data Reviewed: I have personally reviewed following labs and imaging studies  CBC: Recent Labs  Lab 09/21/22 0014 09/21/22 1955 09/24/22 0450 09/25/22 1000 09/26/22 0404  WBC 8.9 10.6* 9.6 11.6* 8.2  NEUTROABS 5.6 7.9* 5.9 10.2*  --   HGB 13.5 13.7 13.6 13.4 12.3*  HCT 40.8 41.6 43.5 42.3 37.6*  MCV 94.4 93.9  97.1 96.1 95.9  PLT 293 327 320 297 276   Basic Metabolic Panel: Recent Labs  Lab 09/21/22 0014 09/21/22 1955 09/24/22 0450 09/25/22 1000 09/26/22 0404  NA 135 137 138 135 135  K 4.5 3.8 4.4 4.4 4.0  CL 101 101 101 100 101  CO2 25 22 27 25 25   GLUCOSE 109* 134* 107* 122* 127*  BUN 15 14 19 15 15   CREATININE 0.98 0.95 1.20 1.00 0.89  CALCIUM 8.9 9.2 9.0 8.6* 8.2*  MG  --   --   --  2.2  --    GFR: Estimated Creatinine Clearance: 113.9 mL/min (by C-G formula based on SCr of 0.89 mg/dL). Liver Function Tests: Recent Labs  Lab 09/21/22 1955 09/25/22 1000 09/26/22 0404  AST 19 19 17   ALT 15 16 15   ALKPHOS 95 87 71  BILITOT 0.6 0.4 0.4  PROT 7.6 7.4 7.0  ALBUMIN 3.7 3.7 3.4*   Recent Labs  Lab 09/25/22 1000  LIPASE 35  No results for input(s): "AMMONIA" in the last 168 hours. Coagulation Profile: Recent Labs  Lab 09/25/22 1000  INR 1.0   Cardiac Enzymes: No results for input(s): "CKTOTAL", "CKMB", "CKMBINDEX", "TROPONINI" in the last 168 hours. BNP (last 3 results) No results for input(s): "PROBNP" in the last 8760 hours. HbA1C: Recent Labs    09/25/22 1431  HGBA1C 6.7*   CBG: Recent Labs  Lab 09/26/22 1139 09/26/22 1653 09/26/22 2103 09/27/22 0749 09/27/22 1110  GLUCAP 135* 201* 101* 115* 127*   Lipid Profile: No results for input(s): "CHOL", "HDL", "LDLCALC", "TRIG", "CHOLHDL", "LDLDIRECT" in the last 72 hours. Thyroid Function Tests: No results for input(s): "TSH", "T4TOTAL", "FREET4", "T3FREE", "THYROIDAB" in the last 72 hours. Anemia Panel: No results for input(s): "VITAMINB12", "FOLATE", "FERRITIN", "TIBC", "IRON", "RETICCTPCT" in the last 72 hours. Sepsis Labs: Recent Labs  Lab 09/25/22 1013 09/25/22 1214 09/25/22 1231 09/26/22 0404  PROCALCITON  --  <0.10  --  <0.10  LATICACIDVEN 1.6  --  1.1  --     Recent Results (from the past 240 hour(s))  Culture, blood (Routine x 2)     Status: None (Preliminary result)   Collection Time:  09/25/22 10:00 AM   Specimen: Right Antecubital; Blood  Result Value Ref Range Status   Specimen Description   Final    RIGHT ANTECUBITAL BLOOD Performed at [facility], [address] [place], [place] [address]    Special Requests   Final    BOTTLES DRAWN AEROBIC AND ANAEROBIC Blood Culture adequate volume Performed at [facility], [address], [place], [place] [address]    Culture   Final    NO GROWTH 2 DAYS Performed at [facility], [address] [place], [place] [address]    Report Status PENDING  Incomplete  Culture, blood (Routine x 2)     Status: None (Preliminary result)   Collection Time: 09/25/22 10:12 AM   Specimen: BLOOD RIGHT ARM  Result Value Ref Range Status   Specimen Description   Final    BLOOD RIGHT ARM Performed at [facility], [address], [place], [place] [address]    Special Requests   Final    BOTTLES DRAWN AEROBIC AND ANAEROBIC Blood Culture results may not be optimal due to an excessive volume of blood received in culture bottles Performed at [facility], [address] [place], [place] [address]    Culture   Final    NO GROWTH 2 DAYS Performed at [facility], [address], [place], [place] [address]    Report Status PENDING  Incomplete  SARS Coronavirus 2 by RT PCR (hospital order, performed in [facility] hospital lab) *cepheid single result test* Anterior Nasal Swab     Status: Abnormal   Collection Time: 09/25/22 10:30 AM   Specimen: Anterior Nasal Swab  Result Value Ref Range Status   SARS Coronavirus 2 by RT PCR POSITIVE (A) NEGATIVE Final    Comment: (NOTE) SARS-CoV-2 target nucleic acids are DETECTED  SARS-CoV-2 RNA is generally detectable in upper respiratory specimens  during the acute phase of infection.  Positive results are indicative  of the presence of the identified virus, but do not rule out bacterial infection or co-infection with other  pathogens not detected by the test.  Clinical correlation with patient history and  other diagnostic information is necessary to determine patient infection status.  The expected result is negative.  Fact Sheet for Patients:  RoadLapTop.co.za   Fact Sheet for Healthcare Providers:   http://kim-miller.com/    This test is not yet approved or cleared by the [place] FDA and  has been authorized for detection and/or diagnosis of SARS-CoV-2 by FDA under an Emergency Use Authorization (EUA).  This EUA will remain in effect (meaning this test can be used) for the duration of  the COVID-19 declaration under Section 564(b)(1)  of the Act, 21 U.S.C. section 360-bbb-3(b)(1), unless the authorization is terminated or revoked sooner.   Performed at [facility], [address], [place], [place] [address]    Radiology Studies: DG CHEST PORT 1 VIEW  Result Date: 09/27/2022 CLINICAL DATA:  Pneumonia EXAM: PORTABLE CHEST 1 VIEW COMPARISON:  Chest radiograph 09/25/2022 FINDINGS: The heart is enlarged, unchanged. The upper mediastinal contours are stable Left worse than right basilar opacities with associated pleural effusions are again seen, worsened on the left since the prior study. There is no pneumothorax There is no acute osseous abnormality. IMPRESSION: Bilateral pleural effusions and left worse than right basilar airspace opacity, worsened on the left since the prior study from 09/25/2022. Electronically Signed   By: Lesia Hausen M.D.   On: 09/27/2022 12:58    Scheduled Meds:  buPROPion  300 mg Oral q morning   busPIRone  15 mg Oral Daily   cyclobenzaprine  10 mg Oral QHS   cycloSPORINE  1 drop Both Eyes BID   dexamethasone (DECADRON) injection  6 mg Intravenous Q24H   diltiazem  180 mg Oral Daily   DULoxetine  60 mg Oral BID   enoxaparin (LOVENOX) injection  60 mg Subcutaneous Q24H   folic acid  1 mg Oral Daily    gabapentin  300 mg Oral TID   guaiFENesin  600 mg Oral BID   insulin aspart  0-20 Units Subcutaneous TID WC   ipratropium-albuterol  3 mL Nebulization TID   lidocaine  1 patch Transdermal Q24H   lisinopril  20 mg Oral Daily   lurasidone  40 mg Oral QPM   metFORMIN  850 mg Oral Q breakfast   oxcarbazepine  600 mg Oral TID   polyethylene glycol  17 g Oral Daily   tamsulosin  0.8 mg Oral Daily   traZODone  75 mg Oral QHS   Continuous Infusions:     LOS: 2 days    Time spent: 35 mins    Willeen Niece, MD [facility]   If 7PM-7AM, please contact night-coverage

## 2022-09-28 DIAGNOSIS — U071 COVID-19: Secondary | ICD-10-CM | POA: Diagnosis not present

## 2022-09-28 LAB — GLUCOSE, CAPILLARY
Glucose-Capillary: 131 mg/dL — ABNORMAL HIGH (ref 70–99)
Glucose-Capillary: 161 mg/dL — ABNORMAL HIGH (ref 70–99)
Glucose-Capillary: 199 mg/dL — ABNORMAL HIGH (ref 70–99)
Glucose-Capillary: 298 mg/dL — ABNORMAL HIGH (ref 70–99)

## 2022-09-28 NOTE — Plan of Care (Signed)
  Problem: Education: Goal: Ability to describe self-care measures that may prevent or decrease complications (Diabetes Survival Skills Education) will improve Outcome: Progressing Goal: Individualized Educational Video(s) Outcome: Progressing   Problem: Activity: Goal: Ability to tolerate increased activity will improve Outcome: Progressing   Problem: Respiratory: Goal: Ability to maintain a clear airway will improve Outcome: Progressing

## 2022-09-28 NOTE — Progress Notes (Signed)
   09/28/22 2041  BiPAP/CPAP/SIPAP  Reason BIPAP/CPAP not in use Non-compliant (pt refusing cpap for the night)  BiPAP/CPAP /SiPAP Vitals  SpO2 95 %  Bilateral Breath Sounds Clear;Diminished

## 2022-09-28 NOTE — Evaluation (Signed)
Physical Therapy Evaluation-1x Patient Details Name: Mario [name] MRN: [id number] DOB: [birth year] Today's Date: 09/28/2022  History of Present Illness  72 yo male admitted with COVID. Hx of COPD-O2 dep at baseline, CAD, OSA, DM, CKD, PE, NSCLC, aortic stenosis  Clinical Impression  On eval, pt was Mod Ind with mobility. He walked ~75 feet around his room (pt reported he had already walked in hallway). O2 89% on 4L, dyspnea 2/4. Will defer further mobility needs to mobility team and/or nursing staff. Do not anticipate any f/u PT needs after discharged. Pt expresses concerns about discharging before he is "no longer contagious"-he is concerned about his roommates. 1x eval.        If plan is discharge home, recommend the following:     Can travel by private vehicle        Equipment Recommendations None recommended by PT  Recommendations for Other Services       Functional Status Assessment Patient has had a recent decline in their functional status and demonstrates the ability to make significant improvements in function in a reasonable and predictable amount of time.     Precautions / Restrictions Precautions Precaution Comments: monitor O2 Restrictions Weight Bearing Restrictions: No      Mobility  Bed Mobility               General bed mobility comments: sitting EOB    Transfers Overall transfer level: Modified independent                      Ambulation/Gait Ambulation/Gait assistance: Modified independent (Device/Increase time) Gait Distance (Feet): 75 Feet Assistive device: None Gait Pattern/deviations: Step-through pattern       General Gait Details: Pt took 3 laps around the room without a device. O2 89% on 4L, dyspnea 2/4  Stairs            Wheelchair Mobility     Tilt Bed    Modified Rankin (Stroke Patients Only)       Balance Overall balance assessment: Mild deficits observed, not formally tested                                            Pertinent Vitals/Pain Pain Assessment Pain Assessment: 0-10 Pain Score: 6  Pain Location: back, L UE Pain Descriptors / Indicators: Aching Pain Intervention(s): Limited activity within patient's tolerance, Monitored during session, Repositioned    Home Living Family/patient expects to be discharged to:: Private residence Living Arrangements: Non-relatives/Friends Available Help at Discharge: Friend(s) Type of Home: House Home Access: Level entry     Alternate Level Stairs-Number of Steps: 5 Home Layout: Two level Home Equipment: Cane - single point;Shower Counsellor (2 wheels);Rollator (4 wheels) Additional Comments: pt reports he rents a room in a home with a few other roommates; goes up/down 5 steps to kitchen/living room area. bedroom on 2nd level of 5 steps. wears 3L O2 at baseline    Prior Function Prior Level of Function : Independent/Modified Independent             Mobility Comments: SPC lately for back pain ADLs Comments: Able to care for his meds, needs assist with community mobility and iADLs. Increasing difficulties with self care including toileting and LB ADLs due to balance, weakness and poor acitivity tolerance.     Hand Dominance   Dominant Hand: Right  Extremity/Trunk Assessment   Upper Extremity Assessment Upper Extremity Assessment: Overall WFL for tasks assessed    Lower Extremity Assessment Lower Extremity Assessment: Overall WFL for tasks assessed    Cervical / Trunk Assessment Cervical / Trunk Assessment: Normal  Communication   Communication: No difficulties  Cognition Arousal/Alertness: Awake/alert Behavior During Therapy: WFL for tasks assessed/performed Overall Cognitive Status: Within Functional Limits for tasks assessed                                          General Comments      Exercises     Assessment/Plan    PT Assessment Patient does not need any  further PT services (can mobilize with mobility team/nursing)  PT Problem List         PT Treatment Interventions      PT Goals (Current goals can be found in the Care Plan section)  Acute Rehab PT Goals Patient Stated Goal: to be able to stay in hospital until he is not contagious PT Goal Formulation: All assessment and education complete, DC therapy    Frequency       Co-evaluation               AM-PAC PT "6 Clicks" Mobility  Outcome Measure Help needed turning from your back to your side while in a flat bed without using bedrails?: None Help needed moving from lying on your back to sitting on the side of a flat bed without using bedrails?: None Help needed moving to and from a bed to a chair (including a wheelchair)?: None Help needed standing up from a chair using your arms (e.g., wheelchair or bedside chair)?: None Help needed to walk in hospital room?: None Help needed climbing 3-5 steps with a railing? : A Little 6 Click Score: 23    End of Session Equipment Utilized During Treatment: Oxygen Activity Tolerance: Patient tolerated treatment well Patient left: in bed;with call bell/phone within reach (sitting EOB)        Time: 4098-1191 PT Time Calculation (min) (ACUTE ONLY): 16 min   Charges:   PT Evaluation $PT Eval Low Complexity: 1 Low   PT General Charges $$ ACUTE PT VISIT: 1 Visit            Faye Ramsay, PT Acute Rehabilitation  Office: [phone]

## 2022-09-28 NOTE — Progress Notes (Signed)
PROGRESS NOTE    Mario [name]  WNU:[id number] DOB: 05-10-1950 DOA: 09/25/2022 PCP: [name]   Brief Narrative:  This 72 yrs old Male with medical history significant of anxiety, bronchitis, COPD, radiation therapy for left lung cancer, hypertension, CAD, history of MI, OSA, history of suicide attempt, tension pneumothorax, uveitis who has being in the emergency department multiple times this week due to multiple issues, but today he came with progressively worsening dyspnea and generalized weakness.  CT chest abdomen and pelvis was done which shows no acute findings in the abdomen and pelvis.  No evidence of metastatic disease.  Large stool burden again seen throughout the colon.  Patient was febrile on arrival in the ED.  Patient was admitted for sepsis and started on empiric antibiotics. He is found to be COVID-positive.  Assessment & Plan:   Principal Problem:   COVID-19 virus infection Active Problems:   COPD with acute exacerbation (HCC)   DM2 (diabetes mellitus, type 2) (HCC)   Non-small cell cancer of left lung (HCC)   Hypertension   History of pulmonary embolism   BPH (benign prostatic hyperplasia)   Stage 3a chronic kidney disease (HCC)   Major depressive disorder, recurrent episode (HCC)   OSA (obstructive sleep apnea)   HLA B27 (HLA B27 positive)   Ankylosing spondylitis lumbar region (HCC)   Acute respiratory insufficiency  Acute on chronic hypoxic respiratory failure sec. to COVID 19 pneumonia: Left lower lobe pneumonia, non-small cell lung cancer: Patient presented with worsening shortness of breath, hypoxia He is found to have COVID-pneumonia. Continue supplemental oxygen and wean as tolerated,  Continue CPAP at bedtime, may use BiPAP if needed. Continue Incentive spirometry, flutter wall exercises,  antitussives as needed. Continue dexamethasone 6 mg IV daily Continue remdesivir X 3  days. Initiated on vancomycin and cefepime for suspected  pneumonia. Procalcitonin 0.1.  Antibiotics discontinued. He is back to his baseline oxygen requirement remains on 4 L/min.  Type 2 diabetes: Carb modified diet. Hold metformin Continue regular insulin sliding scale.  Essential hypertension: Continue Cardizem 180 mg daily Continue lisinopril 20 mg daily  History of pulmonary embolism: No longer on Eliquis. Continue Lovenox for DVT prophylaxis.  BPH: Continue Flomax 0.8 mg daily  CKD stage IIIa: Serum creatinine at baseline.  Continue lisinopril.  Major depressive disorder: Continue bupropion, buspirone, duloxetine, oxcarbazepine and trazodone.  OSA Continue CPAP at night  HLAB27+ Ankylosing spondylitis lumbar region: Continue oxycodone for pain control.   DVT prophylaxis: Lovenox Code Status: Full code Family Communication: No family at bed side Disposition Plan:    Status is: Inpatient Remains inpatient appropriate because: Admitted for acute hypoxic respiratory failure likely secondary to COVID and bacterial pneumonia started on antibiotics and remdesivir.  Antibiotics discontinued as procalcitonin 0.01.   Consultants:  None  Procedures: None  Antimicrobials:  Anti-infectives (From admission, onward)    Start     Dose/Rate Route Frequency Ordered Stop   09/26/22 1000  remdesivir 100 mg in sodium chloride 0.9 % 100 mL IVPB       Placed in "Followed by" Linked Group   100 mg 200 mL/hr over 30 Minutes Intravenous Daily 09/25/22 1203 09/27/22 1329   09/25/22 2200  vancomycin (VANCOREADY) IVPB 1250 mg/250 mL  Status:  Discontinued        1,250 mg 166.7 mL/hr over 90 Minutes Intravenous Every 12 hours 09/25/22 1328 09/27/22 1356   09/25/22 1800  ceFEPIme (MAXIPIME) 2 g in sodium chloride 0.9 % 100 mL IVPB  Status:  Discontinued        2 g 200 mL/hr over 30 Minutes Intravenous Every 8 hours 09/25/22 1204 09/27/22 1356   09/25/22 1500  remdesivir 200 mg in sodium chloride 0.9% 250 mL IVPB       Placed in  "Followed by" Linked Group   200 mg 580 mL/hr over 30 Minutes Intravenous Once 09/25/22 1203 09/25/22 1551   09/25/22 1030  vancomycin (VANCOREADY) IVPB 2000 mg/400 mL        2,000 mg 200 mL/hr over 120 Minutes Intravenous  Once 09/25/22 1005 09/25/22 1358   09/25/22 1015  piperacillin-tazobactam (ZOSYN) IVPB 3.375 g        3.375 g 100 mL/hr over 30 Minutes Intravenous  Once 09/25/22 1005 09/25/22 1116      Subjective: Patient was seen and examined at bedside.  Overnight events noted.   Patient reports doing much better.  He denies any chest pain or shortness of breath. He still remains on 4 L of supplemental oxygen.  He is going to have physical therapy assessment today.   Objective: Vitals:   09/28/22 0824 09/28/22 0958 09/28/22 1121 09/28/22 1425  BP:   (!) 150/80   Pulse:   87   Resp:   18   Temp:   97.7 F (36.5 C)   TempSrc:   Oral   SpO2: 97% 96% 99% 97%  Weight:      Height:        Intake/Output Summary (Last 24 hours) at 09/28/2022 1447 Last data filed at 09/28/2022 1329 Gross per 24 hour  Intake 660 ml  Output 2350 ml  Net -1690 ml   Filed Weights   09/25/22 0902  Weight: (!) 137.4 kg    Examination:  General exam: Appears comfortable, deconditioned, not in any acute distress. Respiratory system: CTA bilaterally . Respiratory effort normal. RR 14 Cardiovascular system: S1 & S2 heard, RRR. No JVD, murmurs, rubs, gallops or clicks. Gastrointestinal system: Abdomen is soft, non tender, non distended, bowel sounds present. Central nervous system: Alert and oriented x 3. No focal neurological deficits. Extremities: No edema, No cyanosis, No clubbing  Skin: No rashes, lesions or ulcers Psychiatry: Judgement and insight appear normal. Mood & affect appropriate.     Data Reviewed: I have personally reviewed following labs and imaging studies  CBC: Recent Labs  Lab 09/21/22 1955 09/24/22 0450 09/25/22 1000 09/26/22 0404 09/28/22 0448  WBC 10.6* 9.6  11.6* 8.2 8.8  NEUTROABS 7.9* 5.9 10.2*  --   --   HGB 13.7 13.6 13.4 12.3* 13.2  HCT 41.6 43.5 42.3 37.6* 41.0  MCV 93.9 97.1 96.1 95.9 95.3  PLT 327 320 297 276 293   Basic Metabolic Panel: Recent Labs  Lab 09/21/22 1955 09/24/22 0450 09/25/22 1000 09/26/22 0404 09/28/22 0448  NA 137 138 135 135 134*  K 3.8 4.4 4.4 4.0 3.9  CL 101 101 100 101 101  CO2 22 27 25 25 24   GLUCOSE 134* 107* 122* 127* 166*  BUN 14 19 15 15 19   CREATININE 0.95 1.20 1.00 0.89 0.77  CALCIUM 9.2 9.0 8.6* 8.2* 8.4*  MG  --   --  2.2  --  2.0  PHOS  --   --   --   --  3.1   GFR: Estimated Creatinine Clearance: 126.8 mL/min (by C-G formula based on SCr of 0.77 mg/dL). Liver Function Tests: Recent Labs  Lab 09/21/22 1955 09/25/22 1000 09/26/22 0404  AST 19 19 17   ALT 15  16 15  ALKPHOS 95 87 71  BILITOT 0.6 0.4 0.4  PROT 7.6 7.4 7.0  ALBUMIN 3.7 3.7 3.4*   Recent Labs  Lab 09/25/22 1000  LIPASE 35   No results for input(s): "AMMONIA" in the last 168 hours. Coagulation Profile: Recent Labs  Lab 09/25/22 1000  INR 1.0   Cardiac Enzymes: No results for input(s): "CKTOTAL", "CKMB", "CKMBINDEX", "TROPONINI" in the last 168 hours. BNP (last 3 results) No results for input(s): "PROBNP" in the last 8760 hours. HbA1C: No results for input(s): "HGBA1C" in the last 72 hours.  CBG: Recent Labs  Lab 09/27/22 1110 09/27/22 1637 09/27/22 2100 09/28/22 0739 09/28/22 1118  GLUCAP 127* 120* 247* 131* 161*   Lipid Profile: No results for input(s): "CHOL", "HDL", "LDLCALC", "TRIG", "CHOLHDL", "LDLDIRECT" in the last 72 hours. Thyroid Function Tests: No results for input(s): "TSH", "T4TOTAL", "FREET4", "T3FREE", "THYROIDAB" in the last 72 hours. Anemia Panel: No results for input(s): "VITAMINB12", "FOLATE", "FERRITIN", "TIBC", "IRON", "RETICCTPCT" in the last 72 hours. Sepsis Labs: Recent Labs  Lab 09/25/22 1013 09/25/22 1214 09/25/22 1231 09/26/22 0404  PROCALCITON  --  <0.10  --   <0.10  LATICACIDVEN 1.6  --  1.1  --     Recent Results (from the past 240 hour(s))  Culture, blood (Routine x 2)     Status: None (Preliminary result)   Collection Time: 09/25/22 10:00 AM   Specimen: Right Antecubital; Blood  Result Value Ref Range Status   Specimen Description   Final    RIGHT ANTECUBITAL BLOOD Performed at [facility], [address] [place], [place] [address]    Special Requests   Final    BOTTLES DRAWN AEROBIC AND ANAEROBIC Blood Culture adequate volume Performed at [facility], [address], [place], [place] [address]    Culture   Final    NO GROWTH 3 DAYS Performed at [facility], [address], [place], [place] [address]    Report Status PENDING  Incomplete  Culture, blood (Routine x 2)     Status: None (Preliminary result)   Collection Time: 09/25/22 10:12 AM   Specimen: BLOOD RIGHT ARM  Result Value Ref Range Status   Specimen Description   Final    BLOOD RIGHT ARM Performed at [facility], [address], [place], [place] [address]    Special Requests   Final    BOTTLES DRAWN AEROBIC AND ANAEROBIC Blood Culture results may not be optimal due to an excessive volume of blood received in culture bottles Performed at [facility], [address] [place], [place] [address]    Culture   Final    NO GROWTH 3 DAYS Performed at [facility], [address], [place], [place] [address]    Report Status PENDING  Incomplete  SARS Coronavirus 2 by RT PCR (hospital order, performed in [facility] hospital lab) *cepheid single result test* Anterior Nasal Swab     Status: Abnormal   Collection Time: 09/25/22 10:30 AM   Specimen: Anterior Nasal Swab  Result Value Ref Range Status   SARS Coronavirus 2 by RT PCR POSITIVE (A) NEGATIVE Final    Comment: (NOTE) SARS-CoV-2 target nucleic acids are DETECTED  SARS-CoV-2 RNA is generally detectable in upper respiratory specimens   during the acute phase of infection.  Positive results are indicative  of the presence of the identified virus, but do not rule out bacterial infection or co-infection with other pathogens  not detected by the test.  Clinical correlation with patient history and  other diagnostic information is necessary to determine patient infection status.  The expected result is negative.  Fact Sheet for Patients:   RoadLapTop.co.za   Fact Sheet for Healthcare Providers:   http://kim-miller.com/    This test is not yet approved or cleared by the [place] FDA and  has been authorized for detection and/or diagnosis of SARS-CoV-2 by FDA under an Emergency Use Authorization (EUA).  This EUA will remain in effect (meaning this test can be used) for the duration of  the COVID-19 declaration under Section 564(b)(1)  of the Act, 21 U.S.C. section 360-bbb-3(b)(1), unless the authorization is terminated or revoked sooner.   Performed at [facility], [address] [place], [place] [address]    Radiology Studies: DG CHEST PORT 1 VIEW  Result Date: 09/27/2022 CLINICAL DATA:  Pneumonia EXAM: PORTABLE CHEST 1 VIEW COMPARISON:  Chest radiograph 09/25/2022 FINDINGS: The heart is enlarged, unchanged. The upper mediastinal contours are stable Left worse than right basilar opacities with associated pleural effusions are again seen, worsened on the left since the prior study. There is no pneumothorax There is no acute osseous abnormality. IMPRESSION: Bilateral pleural effusions and left worse than right basilar airspace opacity, worsened on the left since the prior study from 09/25/2022. Electronically Signed   By: Lesia Hausen M.D.   On: 09/27/2022 12:58    Scheduled Meds:  buPROPion  300 mg Oral q morning   busPIRone  15 mg Oral Daily   cyclobenzaprine  10 mg Oral QHS   cycloSPORINE  1 drop Both Eyes BID   dexamethasone (DECADRON) injection   6 mg Intravenous Q24H   diltiazem  180 mg Oral Daily   DULoxetine  60 mg Oral BID   enoxaparin (LOVENOX) injection  60 mg Subcutaneous Q24H   folic acid  1 mg Oral Daily   gabapentin  300 mg Oral TID   guaiFENesin  600 mg Oral BID   insulin aspart  0-20 Units Subcutaneous TID WC   ipratropium-albuterol  3 mL Nebulization TID   lidocaine  1 patch Transdermal Q24H   lisinopril  20 mg Oral Daily   lurasidone  40 mg Oral QPM   metFORMIN  850 mg Oral Q breakfast   oxcarbazepine  600 mg Oral TID   polyethylene glycol  17 g Oral Daily   tamsulosin  0.8 mg Oral Daily   traZODone  75 mg Oral QHS   Continuous Infusions:     LOS: 3 days    Time spent: 35 mins    Willeen Niece, MD [facility]   If 7PM-7AM, please contact night-coverage

## 2022-09-29 DIAGNOSIS — U071 COVID-19: Secondary | ICD-10-CM | POA: Diagnosis not present

## 2022-09-29 LAB — GLUCOSE, CAPILLARY
Glucose-Capillary: 210 mg/dL — ABNORMAL HIGH (ref 70–99)
Glucose-Capillary: 242 mg/dL — ABNORMAL HIGH (ref 70–99)
Glucose-Capillary: 220 mg/dL — ABNORMAL HIGH (ref 70–99)
Glucose-Capillary: 285 mg/dL — ABNORMAL HIGH (ref 70–99)

## 2022-09-29 NOTE — Progress Notes (Signed)
OT Cancellation Note  Patient Details Name: Mario [name] MRN: [id number] DOB: 05/22/1950   Cancelled Treatment:    Reason Eval/Treat Not Completed: OT screened, no needs identified, will sign off  Limmie Patricia, OTR/L,CBIS  Supplemental OT - [facility] and [facility] Secure Chat Preferred    09/29/2022, 9:06 AM

## 2022-09-29 NOTE — Inpatient Diabetes Management (Signed)
Inpatient Diabetes Program Recommendations  AACE/ADA: New Consensus Statement on Inpatient Glycemic Control   Target Ranges:  Prepandial:   less than 140 mg/dL      Peak postprandial:   less than 180 mg/dL (1-2 hours)      Critically ill patients:  140 - 180 mg/dL    Latest Reference Range & Units 09/28/22 07:39 09/28/22 11:18 09/28/22 15:31 09/28/22 21:16 09/29/22 07:56  Glucose-Capillary 70 - 99 mg/dL 213 (H) 086 (H) 578 (H) 298 (H) 210 (H)   Review of Glycemic Control  Diabetes history: DM2 Outpatient Diabetes medications: Metformin 850 mg QAM Current orders for Inpatient glycemic control: Metformin 850 mg QAM, Novolog 0-20 units TID with meals; Decadron 6 mg Q24H  Inpatient Diabetes Program Recommendations:    Insulin:Please consider adding Novolog 0-5 units at bedtime.  Diet: If appropriate, please consider discontinuing Regular diet and ordering Carb Modified diet.   Thanks, Orlando Penner, RN, MSN, CDCES Diabetes Coordinator Inpatient Diabetes Program [phone] (Team Pager from 8am to 5pm)

## 2022-09-29 NOTE — Progress Notes (Signed)
   09/29/22 1920  BiPAP/CPAP/SIPAP  Reason BIPAP/CPAP not in use Non-compliant

## 2022-09-29 NOTE — Progress Notes (Signed)
PROGRESS NOTE    Mario [name]  BMW:[id number] DOB: 23-Dec-1950 DOA: 09/25/2022 [facility]CP: [name]   Brief Narrative:  This 72 yrs old Male with medical history significant of anxiety, bronchitis, COPD, radiation therapy for left lung cancer, hypertension, CAD, history of MI, OSA, history of suicide attempt, tension pneumothorax, uveitis who has being in the emergency department multiple times this week due to multiple issues, but today he came with progressively worsening dyspnea and generalized weakness.  CT chest abdomen and pelvis was done which shows no acute findings in the abdomen and pelvis.  No evidence of metastatic disease.  Large stool burden again seen throughout the colon.  Patient was febrile on arrival in the ED.  Patient was admitted for sepsis and started on empiric antibiotics. He is found to be COVID-positive.  Assessment & Plan:   Principal Problem:   COVID-19 virus infection Active Problems:   COPD with acute exacerbation (HCC)   DM2 (diabetes mellitus, type 2) (HCC)   Non-small cell cancer of left lung (HCC)   Hypertension   History of pulmonary embolism   BPH (benign prostatic hyperplasia)   Stage 3a chronic kidney disease (HCC)   Major depressive disorder, recurrent episode (HCC)   OSA (obstructive sleep apnea)   HLA B27 (HLA B27 positive)   Ankylosing spondylitis lumbar region (HCC)   Acute respiratory insufficiency  Acute on chronic hypoxic respiratory failure sec. to COVID 19 pneumonia: Left lower lobe pneumonia, non-small cell lung cancer: Patient presented with worsening shortness of breath, hypoxia He is found to have COVID-pneumonia. Continue supplemental oxygen and wean as tolerated,  Continue CPAP at bedtime, may use BiPAP if needed. Continue Incentive spirometry, flutter wall exercises,  antitussives as needed. Continue dexamethasone 6 mg IV daily Continue remdesivir X 3  days. Initiated on vancomycin and cefepime for suspected  pneumonia. Procalcitonin 0.1.  Antibiotics discontinued. He is back to his baseline oxygen requirement remains on 4 L/min.  Type 2 diabetes: Carb modified diet. Hold metformin Continue regular insulin sliding scale.  Essential hypertension: Continue Cardizem 180 mg daily. Continue lisinopril 20 mg daily.  History of pulmonary embolism: No longer on Eliquis. Continue Lovenox for DVT prophylaxis.  BPH: Continue Flomax 0.8 mg daily  CKD stage IIIa: Serum creatinine at baseline.  Continue lisinopril.  Major depressive disorder: Continue bupropion, buspirone, duloxetine, oxcarbazepine and trazodone.  OSA Continue CPAP at night  HLAB27+ Ankylosing spondylitis lumbar region: Continue oxycodone for pain control.   DVT prophylaxis: Lovenox Code Status: Full code Family Communication: No family at bed side Disposition Plan:    Status is: Inpatient Remains inpatient appropriate because: Admitted for acute hypoxic respiratory failure likely secondary to COVID and bacterial pneumonia,  started on antibiotics and remdesivir.  Antibiotics discontinued as procalcitonin 0.01.   Consultants:  None  Procedures: None  Antimicrobials:  Anti-infectives (From admission, onward)    Start     Dose/Rate Route Frequency Ordered Stop   09/26/22 1000  remdesivir 100 mg in sodium chloride 0.9 % 100 mL IVPB       Placed in "Followed by" Linked Group   100 mg 200 mL/hr over 30 Minutes Intravenous Daily 09/25/22 1203 09/27/22 1329   09/25/22 2200  vancomycin (VANCOREADY) IVPB 1250 mg/250 mL  Status:  Discontinued        1,250 mg 166.7 mL/hr over 90 Minutes Intravenous Every 12 hours 09/25/22 1328 09/27/22 1356   09/25/22 1800  ceFEPIme (MAXIPIME) 2 g in sodium chloride 0.9 % 100 mL IVPB  Status:  Discontinued        2 g 200 mL/hr over 30 Minutes Intravenous Every 8 hours 09/25/22 1204 09/27/22 1356   09/25/22 1500  remdesivir 200 mg in sodium chloride 0.9% 250 mL IVPB       Placed in  "Followed by" Linked Group   200 mg 580 mL/hr over 30 Minutes Intravenous Once 09/25/22 1203 09/25/22 1551   09/25/22 1030  vancomycin (VANCOREADY) IVPB 2000 mg/400 mL        2,000 mg 200 mL/hr over 120 Minutes Intravenous  Once 09/25/22 1005 09/25/22 1358   09/25/22 1015  piperacillin-tazobactam (ZOSYN) IVPB 3.375 g        3.375 g 100 mL/hr over 30 Minutes Intravenous  Once 09/25/22 1005 09/25/22 1116      Subjective: Patient was seen and examined at bedside.  Overnight events noted.   Patient reports doing much better.  He denies any chest pain or shortness of breath. He still remains on 4 L of supplemental oxygen. He feels not comfortable being discharged today.   Objective: Vitals:   09/29/22 0422 09/29/22 0424 09/29/22 0752 09/29/22 1148  BP: (!) 129/59 124/60  (!) 169/94  Pulse: 69 74  85  Resp: 20   20  Temp: 97.9 F (36.6 C) 98.9 F (37.2 C)  98.6 F (37 C)  TempSrc: Oral Oral  Oral  SpO2: 98%  93% 97%  Weight:      Height:        Intake/Output Summary (Last 24 hours) at 09/29/2022 1404 Last data filed at 09/29/2022 0900 Gross per 24 hour  Intake 220 ml  Output 500 ml  Net -280 ml   Filed Weights   09/25/22 0902  Weight: (!) 137.4 kg    Examination:  General exam: Appears comfortable, deconditioned, not in any acute distress. Respiratory system: CTA bilaterally . Respiratory effort normal. RR 14 Cardiovascular system: S1 & S2 heard, RRR. No JVD, murmurs, rubs, gallops or clicks. Gastrointestinal system: Abdomen is soft, non tender, non distended, bowel sounds present. Central nervous system: Alert and oriented x 3. No focal neurological deficits. Extremities: No edema, No cyanosis, No clubbing  Skin: No rashes, lesions or ulcers Psychiatry: Judgement and insight appear normal. Mood & affect appropriate.     Data Reviewed: I have personally reviewed following labs and imaging studies  CBC: Recent Labs  Lab 09/24/22 0450 09/25/22 1000 09/26/22 0404  09/28/22 0448  WBC 9.6 11.6* 8.2 8.8  NEUTROABS 5.9 10.2*  --   --   HGB 13.6 13.4 12.3* 13.2  HCT 43.5 42.3 37.6* 41.0  MCV 97.1 96.1 95.9 95.3  PLT 320 297 276 293   Basic Metabolic Panel: Recent Labs  Lab 09/24/22 0450 09/25/22 1000 09/26/22 0404 09/28/22 0448  NA 138 135 135 134*  K 4.4 4.4 4.0 3.9  CL 101 100 101 101  CO2 27 25 25 24   GLUCOSE 107* 122* 127* 166*  BUN 19 15 15 19   CREATININE 1.20 1.00 0.89 0.77  CALCIUM 9.0 8.6* 8.2* 8.4*  MG  --  2.2  --  2.0  PHOS  --   --   --  3.1   GFR: Estimated Creatinine Clearance: 126.8 mL/min (by C-G formula based on SCr of 0.77 mg/dL). Liver Function Tests: Recent Labs  Lab 09/25/22 1000 09/26/22 0404  AST 19 17  ALT 16 15  ALKPHOS 87 71  BILITOT 0.4 0.4  PROT 7.4 7.0  ALBUMIN 3.7 3.4*   Recent Labs  Lab 09/25/22 1000  LIPASE 35   No results for input(s): "AMMONIA" in the last 168 hours. Coagulation Profile: Recent Labs  Lab 09/25/22 1000  INR 1.0   Cardiac Enzymes: No results for input(s): "CKTOTAL", "CKMB", "CKMBINDEX", "TROPONINI" in the last 168 hours. BNP (last 3 results) No results for input(s): "PROBNP" in the last 8760 hours. HbA1C: No results for input(s): "HGBA1C" in the last 72 hours.  CBG: Recent Labs  Lab 09/28/22 1118 09/28/22 1531 09/28/22 2116 09/29/22 0756 09/29/22 1145  GLUCAP 161* 199* 298* 210* 242*   Lipid Profile: No results for input(s): "CHOL", "HDL", "LDLCALC", "TRIG", "CHOLHDL", "LDLDIRECT" in the last 72 hours. Thyroid Function Tests: No results for input(s): "TSH", "T4TOTAL", "FREET4", "T3FREE", "THYROIDAB" in the last 72 hours. Anemia Panel: No results for input(s): "VITAMINB12", "FOLATE", "FERRITIN", "TIBC", "IRON", "RETICCTPCT" in the last 72 hours. Sepsis Labs: Recent Labs  Lab 09/25/22 1013 09/25/22 1214 09/25/22 1231 09/26/22 0404  PROCALCITON  --  <0.10  --  <0.10  LATICACIDVEN 1.6  --  1.1  --     Recent Results (from the past 240 hour(s))   Culture, blood (Routine x 2)     Status: None (Preliminary result)   Collection Time: 09/25/22 10:00 AM   Specimen: Right Antecubital; Blood  Result Value Ref Range Status   Specimen Description   Final    RIGHT ANTECUBITAL BLOOD Performed at [facility], [address], [place], [place] [address]    Special Requests   Final    BOTTLES DRAWN AEROBIC AND ANAEROBIC Blood Culture adequate volume Performed at [facility], [address] [place], [place] [address]    Culture   Final    NO GROWTH 4 DAYS Performed at [facility], [address] [place], [place] [address]    Report Status PENDING  Incomplete  Culture, blood (Routine x 2)     Status: None (Preliminary result)   Collection Time: 09/25/22 10:12 AM   Specimen: BLOOD RIGHT ARM  Result Value Ref Range Status   Specimen Description   Final    BLOOD RIGHT ARM Performed at [facility], [address] [place], [place] [address]    Special Requests   Final    BOTTLES DRAWN AEROBIC AND ANAEROBIC Blood Culture results may not be optimal due to an excessive volume of blood received in culture bottles Performed at [facility], [address] [place], [place] [address]    Culture   Final    NO GROWTH 4 DAYS Performed at [facility], [address] [place], [place] [address]    Report Status PENDING  Incomplete  SARS Coronavirus 2 by RT PCR (hospital order, performed in [facility] hospital lab) *cepheid single result test* Anterior Nasal Swab     Status: Abnormal   Collection Time: 09/25/22 10:30 AM   Specimen: Anterior Nasal Swab  Result Value Ref Range Status   SARS Coronavirus 2 by RT PCR POSITIVE (A) NEGATIVE Final    Comment: (NOTE) SARS-CoV-2 target nucleic acids are DETECTED  SARS-CoV-2 RNA is generally detectable in upper respiratory specimens  during the acute phase of infection.  Positive results are indicative  of the presence of  the identified virus, but do not rule out bacterial infection or co-infection with other pathogens not detected by the test.  Clinical correlation with patient history and  other diagnostic information is necessary to determine patient infection status.  The expected result  is negative.  Fact Sheet for Patients:   RoadLapTop.co.za   Fact Sheet for Healthcare Providers:   http://kim-miller.com/    This test is not yet approved or cleared by the [place] FDA and  has been authorized for detection and/or diagnosis of SARS-CoV-2 by FDA under an Emergency Use Authorization (EUA).  This EUA will remain in effect (meaning this test can be used) for the duration of  the COVID-19 declaration under Section 564(b)(1)  of the Act, 21 U.S.C. section 360-bbb-3(b)(1), unless the authorization is terminated or revoked sooner.   Performed at [facility], [address] [place], [place] [address]    Radiology Studies: No results found.  Scheduled Meds:  buPROPion  300 mg Oral q morning   busPIRone  15 mg Oral Daily   cyclobenzaprine  10 mg Oral QHS   cycloSPORINE  1 drop Both Eyes BID   dexamethasone (DECADRON) injection  6 mg Intravenous Q24H   diltiazem  180 mg Oral Daily   DULoxetine  60 mg Oral BID   enoxaparin (LOVENOX) injection  60 mg Subcutaneous Q24H   folic acid  1 mg Oral Daily   gabapentin  300 mg Oral TID   guaiFENesin  600 mg Oral BID   insulin aspart  0-20 Units Subcutaneous TID WC   ipratropium-albuterol  3 mL Nebulization TID   lidocaine  1 patch Transdermal Q24H   lisinopril  20 mg Oral Daily   lurasidone  40 mg Oral QPM   metFORMIN  850 mg Oral Q breakfast   oxcarbazepine  600 mg Oral TID   polyethylene glycol  17 g Oral Daily   tamsulosin  0.8 mg Oral Daily   traZODone  75 mg Oral QHS   Continuous Infusions:     LOS: 4 days    Time spent: 35 mins    Willeen Niece, MD [facility]   If 7PM-7AM, please contact night-coverage

## 2022-09-29 NOTE — Plan of Care (Signed)
Reinforce teaching by having patient teach back. Monitor intake and output. Monitor labs. Ambulating in room; gait steady. Monitor oxygen levels. Continue current plan of care.

## 2022-09-30 DIAGNOSIS — U071 COVID-19: Secondary | ICD-10-CM | POA: Diagnosis not present

## 2022-09-30 LAB — GLUCOSE, CAPILLARY
Glucose-Capillary: 129 mg/dL — ABNORMAL HIGH (ref 70–99)
Glucose-Capillary: 120 mg/dL — ABNORMAL HIGH (ref 70–99)
Glucose-Capillary: 214 mg/dL — ABNORMAL HIGH (ref 70–99)
Glucose-Capillary: 254 mg/dL — ABNORMAL HIGH (ref 70–99)

## 2022-09-30 NOTE — Progress Notes (Signed)
   09/30/22 2226  BiPAP/CPAP/SIPAP  Reason BIPAP/CPAP not in use Non-compliant

## 2022-09-30 NOTE — Progress Notes (Signed)
PROGRESS NOTE    Mario [name]  UXN:[id number] DOB: 1950/10/12 DOA: 09/25/2022 PCP: [name]   Brief Narrative:  This 72 yrs old Male with medical history significant of anxiety, bronchitis, COPD, radiation therapy for left lung cancer, hypertension, CAD, history of MI, OSA, history of suicide attempt, tension pneumothorax, uveitis who has being in the emergency department multiple times this week due to multiple issues, but today he came with progressively worsening dyspnea and generalized weakness.  CT chest abdomen and pelvis was done which shows no acute findings in the abdomen and pelvis.  No evidence of metastatic disease.  Large stool burden again seen throughout the colon.  Patient was febrile on arrival in the ED.  Patient was admitted for sepsis and started on empiric antibiotics. He is found to be COVID-positive.  Assessment & Plan:   Principal Problem:   COVID-19 virus infection Active Problems:   COPD with acute exacerbation (HCC)   DM2 (diabetes mellitus, type 2) (HCC)   Non-small cell cancer of left lung (HCC)   Hypertension   History of pulmonary embolism   BPH (benign prostatic hyperplasia)   Stage 3a chronic kidney disease (HCC)   Major depressive disorder, recurrent episode (HCC)   OSA (obstructive sleep apnea)   HLA B27 (HLA B27 positive)   Ankylosing spondylitis lumbar region (HCC)   Acute respiratory insufficiency  Acute on chronic hypoxic respiratory failure sec. to COVID 19 pneumonia: Left lower lobe pneumonia, non-small cell lung cancer: Patient presented with worsening shortness of breath, and  hypoxia He is found to have COVID-pneumonia. Continue supplemental oxygen and wean as tolerated,  Continue CPAP at bedtime, may use BiPAP if needed. Continue Incentive spirometry, flutter wall exercises,  antitussives as needed. Continue dexamethasone 6 mg IV daily Completed  remdesivir X 3  days. Initiated on vancomycin and cefepime for suspected  pneumonia. Procalcitonin 0.1.  Antibiotics discontinued. He is back to his baseline oxygen requirement , remains on 4 L/min.  Type 2 diabetes: Carb modified diet. Hold metformin Continue regular insulin sliding scale.  Essential hypertension: Continue Cardizem 180 mg daily. Continue lisinopril 20 mg daily.  History of pulmonary embolism: No longer on Eliquis. Continue Lovenox for DVT prophylaxis.  BPH: Continue Flomax 0.8 mg daily  CKD stage IIIa: Serum creatinine at baseline.  Continue lisinopril.  Major depressive disorder: Continue bupropion, buspirone, duloxetine, oxcarbazepine and trazodone.  OSA Continue CPAP at night  HLAB27+ Ankylosing spondylitis lumbar region: Continue oxycodone for pain control.   DVT prophylaxis: Lovenox Code Status: Full code Family Communication: No family at bed side Disposition Plan:    Status is: Inpatient Remains inpatient appropriate because: Admitted for acute hypoxic respiratory failure likely secondary to COVID and bacterial pneumonia,  started on antibiotics and remdesivir.  Antibiotics discontinued as procalcitonin 0.01.  Patient continues to report generalized weakness. Anticipated discharge home 10/01/2022   Consultants:  None  Procedures: None  Antimicrobials:  Anti-infectives (From admission, onward)    Start     Dose/Rate Route Frequency Ordered Stop   09/26/22 1000  remdesivir 100 mg in sodium chloride 0.9 % 100 mL IVPB       Placed in "Followed by" Linked Group   100 mg 200 mL/hr over 30 Minutes Intravenous Daily 09/25/22 1203 09/27/22 1329   09/25/22 2200  vancomycin (VANCOREADY) IVPB 1250 mg/250 mL  Status:  Discontinued        1,250 mg 166.7 mL/hr over 90 Minutes Intravenous Every 12 hours 09/25/22 1328 09/27/22 1356   09/25/22  1800  ceFEPIme (MAXIPIME) 2 g in sodium chloride 0.9 % 100 mL IVPB  Status:  Discontinued        2 g 200 mL/hr over 30 Minutes Intravenous Every 8 hours 09/25/22 1204 09/27/22 1356    09/25/22 1500  remdesivir 200 mg in sodium chloride 0.9% 250 mL IVPB       Placed in "Followed by" Linked Group   200 mg 580 mL/hr over 30 Minutes Intravenous Once 09/25/22 1203 09/25/22 1551   09/25/22 1030  vancomycin (VANCOREADY) IVPB 2000 mg/400 mL        2,000 mg 200 mL/hr over 120 Minutes Intravenous  Once 09/25/22 1005 09/25/22 1358   09/25/22 1015  piperacillin-tazobactam (ZOSYN) IVPB 3.375 g        3.375 g 100 mL/hr over 30 Minutes Intravenous  Once 09/25/22 1005 09/25/22 1116      Subjective: Patient was seen and examined at bedside.  Overnight events noted.   Patient reports doing much better.  He denies any chest pain or shortness of breath. He still remains on 4 L of supplemental oxygen.  He states not comfortable going home today.   Objective: Vitals:   09/29/22 2039 09/30/22 0529 09/30/22 0922 09/30/22 1208  BP: (!) 161/90 136/79  (!) 166/97  Pulse: 93 72  91  Resp: 18 18    Temp: 98.4 F (36.9 C) (!) 97.5 F (36.4 C)  98.1 F (36.7 C)  TempSrc: Oral Oral  Oral  SpO2: 94% 96% 96% 93%  Weight:      Height:        Intake/Output Summary (Last 24 hours) at 09/30/2022 1500 Last data filed at 09/30/2022 1325 Gross per 24 hour  Intake 840 ml  Output --  Net 840 ml   Filed Weights   09/25/22 0902  Weight: (!) 137.4 kg    Examination:  General exam: Appears comfortable, deconditioned, not in any acute distress. Respiratory system: CTA bilaterally . Respiratory effort normal. RR 12 Cardiovascular system: S1 & S2 heard, RRR. No JVD, murmurs, rubs, gallops or clicks. Gastrointestinal system: Abdomen is soft, non tender, non distended, bowel sounds present. Central nervous system: Alert and oriented x 3. No focal neurological deficits. Extremities: No edema, No cyanosis, No clubbing  Skin: No rashes, lesions or ulcers Psychiatry: Judgement and insight appear normal. Mood & affect appropriate.     Data Reviewed: I have personally reviewed following labs and  imaging studies  CBC: Recent Labs  Lab 09/24/22 0450 09/25/22 1000 09/26/22 0404 09/28/22 0448 09/30/22 0406  WBC 9.6 11.6* 8.2 8.8 13.5*  NEUTROABS 5.9 10.2*  --   --   --   HGB 13.6 13.4 12.3* 13.2 14.6  HCT 43.5 42.3 37.6* 41.0 45.4  MCV 97.1 96.1 95.9 95.3 95.6  PLT 320 297 276 293 347   Basic Metabolic Panel: Recent Labs  Lab 09/24/22 0450 09/25/22 1000 09/26/22 0404 09/28/22 0448 09/30/22 0406  NA 138 135 135 134* 135  K 4.4 4.4 4.0 3.9 4.2  CL 101 100 101 101 98  CO2 27 25 25 24 28   GLUCOSE 107* 122* 127* 166* 192*  BUN 19 15 15 19 19   CREATININE 1.20 1.00 0.89 0.77 0.84  CALCIUM 9.0 8.6* 8.2* 8.4* 8.9  MG  --  2.2  --  2.0 2.1  PHOS  --   --   --  3.1 3.2   GFR: Estimated Creatinine Clearance: 120.7 mL/min (by C-G formula based on SCr of 0.84 mg/dL). Liver  Function Tests: Recent Labs  Lab 09/25/22 1000 09/26/22 0404  AST 19 17  ALT 16 15  ALKPHOS 87 71  BILITOT 0.4 0.4  PROT 7.4 7.0  ALBUMIN 3.7 3.4*   Recent Labs  Lab 09/25/22 1000  LIPASE 35   No results for input(s): "AMMONIA" in the last 168 hours. Coagulation Profile: Recent Labs  Lab 09/25/22 1000  INR 1.0   Cardiac Enzymes: No results for input(s): "CKTOTAL", "CKMB", "CKMBINDEX", "TROPONINI" in the last 168 hours. BNP (last 3 results) No results for input(s): "PROBNP" in the last 8760 hours. HbA1C: No results for input(s): "HGBA1C" in the last 72 hours.  CBG: Recent Labs  Lab 09/29/22 1145 09/29/22 1711 09/29/22 2036 09/30/22 0752 09/30/22 1201  GLUCAP 242* 220* 285* 129* 120*   Lipid Profile: No results for input(s): "CHOL", "HDL", "LDLCALC", "TRIG", "CHOLHDL", "LDLDIRECT" in the last 72 hours. Thyroid Function Tests: No results for input(s): "TSH", "T4TOTAL", "FREET4", "T3FREE", "THYROIDAB" in the last 72 hours. Anemia Panel: No results for input(s): "VITAMINB12", "FOLATE", "FERRITIN", "TIBC", "IRON", "RETICCTPCT" in the last 72 hours. Sepsis Labs: Recent Labs   Lab 09/25/22 1013 09/25/22 1214 09/25/22 1231 09/26/22 0404  PROCALCITON  --  <0.10  --  <0.10  LATICACIDVEN 1.6  --  1.1  --     Recent Results (from the past 240 hour(s))  Culture, blood (Routine x 2)     Status: None   Collection Time: 09/25/22 10:00 AM   Specimen: Right Antecubital; Blood  Result Value Ref Range Status   Specimen Description   Final    RIGHT ANTECUBITAL BLOOD Performed at [facility], [address], [place], [place] [address]    Special Requests   Final    BOTTLES DRAWN AEROBIC AND ANAEROBIC Blood Culture adequate volume Performed at [facility], [address] [place], [place] [address]    Culture   Final    NO GROWTH 5 DAYS Performed at [facility], [address], [place], [place] [address]    Report Status 09/30/2022 FINAL  Final  Culture, blood (Routine x 2)     Status: None   Collection Time: 09/25/22 10:12 AM   Specimen: BLOOD RIGHT ARM  Result Value Ref Range Status   Specimen Description   Final    BLOOD RIGHT ARM Performed at [facility], [address], [place], [place] [address]    Special Requests   Final    BOTTLES DRAWN AEROBIC AND ANAEROBIC Blood Culture results may not be optimal due to an excessive volume of blood received in culture bottles Performed at [facility], [address], [place], [place] [address]    Culture   Final    NO GROWTH 5 DAYS Performed at [facility], [address] [place], [place] [address]    Report Status 09/30/2022 FINAL  Final  SARS Coronavirus 2 by RT PCR (hospital order, performed in [facility] hospital lab) *cepheid single result test* Anterior Nasal Swab     Status: Abnormal   Collection Time: 09/25/22 10:30 AM   Specimen: Anterior Nasal Swab  Result Value Ref Range Status   SARS Coronavirus 2 by RT PCR POSITIVE (A) NEGATIVE Final    Comment: (NOTE) SARS-CoV-2 target nucleic acids are DETECTED  SARS-CoV-2 RNA  is generally detectable in upper respiratory specimens  during the acute phase of infection.  Positive results are indicative  of the presence of the identified virus, but do  not rule out bacterial infection or co-infection with other pathogens not detected by the test.  Clinical correlation with patient history and  other diagnostic information is necessary to determine patient infection status.  The expected result is negative.  Fact Sheet for Patients:   RoadLapTop.co.za   Fact Sheet for Healthcare Providers:   http://kim-miller.com/    This test is not yet approved or cleared by the [place] FDA and  has been authorized for detection and/or diagnosis of SARS-CoV-2 by FDA under an Emergency Use Authorization (EUA).  This EUA will remain in effect (meaning this test can be used) for the duration of  the COVID-19 declaration under Section 564(b)(1)  of the Act, 21 U.S.C. section 360-bbb-3(b)(1), unless the authorization is terminated or revoked sooner.   Performed at [facility], [address], [place], [place] [address]    Radiology Studies: No results found.  Scheduled Meds:  buPROPion  300 mg Oral q morning   busPIRone  15 mg Oral Daily   cyclobenzaprine  10 mg Oral QHS   cycloSPORINE  1 drop Both Eyes BID   dexamethasone (DECADRON) injection  6 mg Intravenous Q24H   diltiazem  180 mg Oral Daily   DULoxetine  60 mg Oral BID   enoxaparin (LOVENOX) injection  60 mg Subcutaneous Q24H   folic acid  1 mg Oral Daily   gabapentin  300 mg Oral TID   guaiFENesin  600 mg Oral BID   insulin aspart  0-20 Units Subcutaneous TID WC   ipratropium-albuterol  3 mL Nebulization TID   lidocaine  1 patch Transdermal Q24H   lisinopril  20 mg Oral Daily   lurasidone  40 mg Oral QPM   metFORMIN  850 mg Oral Q breakfast   oxcarbazepine  600 mg Oral TID   polyethylene glycol  17 g Oral Daily   tamsulosin  0.8 mg Oral  Daily   traZODone  75 mg Oral QHS   Continuous Infusions:     LOS: 5 days    Time spent: 35 mins    Willeen Niece, MD [facility]   If 7PM-7AM, please contact night-coverage

## 2022-09-30 NOTE — Progress Notes (Signed)
Mobility Specialist - Progress Note  During mobility: 113 bpm HR, 89% SPO2 Post-mobility: 103 bpm, 92% SPO2   09/30/22 1340  Oxygen Therapy  O2 Device Nasal Cannula  O2 Flow Rate (L/min) 4 L/min  Patient Activity (if Appropriate) Ambulating  Mobility  Activity Ambulated independently in room  Level of Assistance Standby assist, set-up cues, supervision of patient - no hands on  Assistive Device None  Distance Ambulated (ft) 80 ft  Range of Motion/Exercises Active  Activity Response Tolerated well  Mobility Referral Yes  $Mobility charge 1 Mobility  Mobility Specialist Start Time (ACUTE ONLY) 1330  Mobility Specialist Stop Time (ACUTE ONLY) 1340  Mobility Specialist Time Calculation (min) (ACUTE ONLY) 10 min   Pt was found in bed and agreeable to ambulate in room. C/o back pain during session and feeling SOB. SPO2 was >89% throughout session and able to recover to 90% without breaks. At EOS returned to bed with all needs met and call bell in reach.  Billey Chang Mobility Specialist

## 2022-10-01 ENCOUNTER — Emergency Department (HOSPITAL_COMMUNITY)
Admission: EM | Admit: 2022-10-01 | Discharge: 2022-10-01 | Disposition: A | Discharge status: Home / Self Care | Payer: No Typology Code available for payment source | Attending: Emergency Medicine | Admitting: Emergency Medicine

## 2022-10-01 ENCOUNTER — Other Ambulatory Visit: Payer: Self-pay

## 2022-10-01 ENCOUNTER — Emergency Department (HOSPITAL_COMMUNITY): Payer: No Typology Code available for payment source

## 2022-10-01 ENCOUNTER — Encounter (HOSPITAL_COMMUNITY): Payer: Self-pay

## 2022-10-01 DIAGNOSIS — U071 COVID-19: Secondary | ICD-10-CM | POA: Insufficient documentation

## 2022-10-01 DIAGNOSIS — I10 Essential (primary) hypertension: Secondary | ICD-10-CM | POA: Insufficient documentation

## 2022-10-01 DIAGNOSIS — D72829 Elevated white blood cell count, unspecified: Secondary | ICD-10-CM | POA: Insufficient documentation

## 2022-10-01 DIAGNOSIS — J449 Chronic obstructive pulmonary disease, unspecified: Secondary | ICD-10-CM | POA: Insufficient documentation

## 2022-10-01 DIAGNOSIS — Z79899 Other long term (current) drug therapy: Secondary | ICD-10-CM | POA: Insufficient documentation

## 2022-10-01 DIAGNOSIS — Z85118 Personal history of other malignant neoplasm of bronchus and lung: Secondary | ICD-10-CM | POA: Insufficient documentation

## 2022-10-01 DIAGNOSIS — J441 Chronic obstructive pulmonary disease with (acute) exacerbation: Secondary | ICD-10-CM | POA: Insufficient documentation

## 2022-10-01 DIAGNOSIS — I2510 Atherosclerotic heart disease of native coronary artery without angina pectoris: Secondary | ICD-10-CM | POA: Insufficient documentation

## 2022-10-01 LAB — CBC WITH DIFFERENTIAL/PLATELET
Abs Immature Granulocytes: 0.22 10*3/uL — ABNORMAL HIGH (ref 0.00–0.07)
Basophils Absolute: 0 10*3/uL (ref 0.0–0.1)
Basophils Relative: 0 %
Eosinophils Absolute: 0 10*3/uL (ref 0.0–0.5)
Eosinophils Relative: 0 %
HCT: 45.1 % (ref 39.0–52.0)
Hemoglobin: 15 g/dL (ref 13.0–17.0)
Immature Granulocytes: 1 %
Lymphocytes Relative: 8 %
Lymphs Abs: 1.7 10*3/uL (ref 0.7–4.0)
MCH: 30.4 pg (ref 26.0–34.0)
MCHC: 33.3 g/dL (ref 30.0–36.0)
MCV: 91.5 fL (ref 80.0–100.0)
Monocytes Absolute: 1.3 10*3/uL — ABNORMAL HIGH (ref 0.1–1.0)
Monocytes Relative: 6 %
Neutro Abs: 17.9 10*3/uL — ABNORMAL HIGH (ref 1.7–7.7)
Neutrophils Relative %: 85 %
Platelets: 400 10*3/uL (ref 150–400)
RBC: 4.93 MIL/uL (ref 4.22–5.81)
RDW: 15.3 % (ref 11.5–15.5)
WBC: 21.1 10*3/uL — ABNORMAL HIGH (ref 4.0–10.5)
nRBC: 0 % (ref 0.0–0.2)

## 2022-10-01 LAB — BASIC METABOLIC PANEL
Anion gap: 13 (ref 5–15)
BUN: 25 mg/dL — ABNORMAL HIGH (ref 8–23)
CO2: 22 mmol/L (ref 22–32)
Calcium: 9.2 mg/dL (ref 8.9–10.3)
Chloride: 98 mmol/L (ref 98–111)
Creatinine, Ser: 1.24 mg/dL (ref 0.61–1.24)
GFR, Estimated: >60 mL/min (ref >60)
Glucose, Bld: 195 mg/dL — ABNORMAL HIGH (ref 70–99)
Potassium: 4.5 mmol/L (ref 3.5–5.1)
Sodium: 133 mmol/L — ABNORMAL LOW (ref 135–145)

## 2022-10-01 LAB — PROCALCITONIN: Procalcitonin: <0.1 ng/mL

## 2022-10-01 LAB — BRAIN NATRIURETIC PEPTIDE: B Natriuretic Peptide: 16.7 pg/mL (ref 0.0–100.0)

## 2022-10-01 LAB — GLUCOSE, CAPILLARY: Glucose-Capillary: 155 mg/dL — ABNORMAL HIGH (ref 70–99)

## 2022-10-01 MED ORDER — DEXAMETHASONE 6 MG PO TABS: 6.0000 mg | ORAL_TABLET | Freq: Two times a day (BID) | ORAL | 0 refills | Status: AC

## 2022-10-01 MED ORDER — IPRATROPIUM-ALBUTEROL 0.5-2.5 (3) MG/3ML IN SOLN
3.0000 mL | Freq: Once | RESPIRATORY_TRACT | Status: AC
  Administered 2022-10-01: 3 mL via RESPIRATORY_TRACT
  Filled 2022-10-01: qty 3

## 2022-10-01 NOTE — Discharge Instructions (Signed)
Advised to follow-up with primary care physician in 1 week. Advised to take Decadron 6 mg daily for 5 more days.

## 2022-10-01 NOTE — Discharge Summary (Signed)
Physician Discharge Summary  Mario [name] RUE:[id number] DOB: 26-May-1950 DOA: 09/25/2022  PCP: [name]  Admit date: 09/25/2022  Discharge date: 10/01/2022  Admitted From: Home.  Disposition: Home.  Recommendations for Outpatient Follow-up:  Follow up with PCP in 1-2 weeks Please obtain BMP/CBC in one week Advised to take Decadron 6 mg daily for 5 more days.  Home Health:None Equipment/Devices: Home Oxygen @ 4L/min  Discharge Condition: Stable CODE STATUS:Full code Diet recommendation: Heart Healthy   Brief [facility] Course: This 72 yrs old Male with medical history significant of anxiety, bronchitis, COPD, radiation therapy for left lung cancer, hypertension, CAD, history of MI, OSA, history of suicide attempt, tension pneumothorax, uveitis who has been in the emergency department multiple times this week due to multiple issues, but today he came with progressively worsening dyspnea and generalized weakness.  CT chest abdomen and pelvis was done which showed no acute findings in the abdomen and pelvis.  No evidence of metastatic disease.  Large stool burden again seen throughout the colon. Patient was febrile on arrival in the ED. Patient was admitted for sepsis and started on empiric antibiotics. He is found to be COVID-positive.  Patient was initially continued on IV antibiotics and remdesivir and Decadron.  Subsequently antibiotics were discontinued as cultures were negative. Patient has completed remdesivir treatment.  He is back to his baseline oxygen requirement. Patient participated with physical therapy , recommended outpatient PT.  Patient feels better, wants to be discharged.  Patient being discharged home.   Discharge Diagnoses:  Principal Problem:   COVID-19 virus infection Active Problems:   COPD with acute exacerbation (HCC)   DM2 (diabetes mellitus, type 2) (HCC)   Non-small cell cancer of left lung (HCC)   Hypertension   History of pulmonary  embolism   BPH (benign prostatic hyperplasia)   Stage 3a chronic kidney disease (HCC)   Major depressive disorder, recurrent episode (HCC)   OSA (obstructive sleep apnea)   HLA B27 (HLA B27 positive)   Ankylosing spondylitis lumbar region (HCC)   Acute respiratory insufficiency  Acute on chronic hypoxic respiratory failure sec. to COVID 19 pneumonia: Left lower lobe pneumonia, non-small cell lung cancer: Patient presented with worsening shortness of breath, and  hypoxia He is found to have COVID-pneumonia. Continue supplemental oxygen and wean as tolerated,  Continue CPAP at bedtime, may use BiPAP if needed. Continue Incentive spirometry, flutter wall exercises,  antitussives as needed. Continue dexamethasone 6 mg IV daily Completed  remdesivir X 3  days. Initiated on vancomycin and cefepime for suspected pneumonia. Procalcitonin 0.1.  Antibiotics discontinued. He is back to his baseline oxygen requirement , remains on 4 L/min.   Type 2 diabetes: Carb modified diet. Resume metformin. Continue regular insulin sliding scale.   Essential hypertension: Continue Cardizem 180 mg daily. Continue lisinopril 20 mg daily.   History of pulmonary embolism: No longer on Eliquis. Continue Lovenox for DVT prophylaxis.   BPH: Continue Flomax 0.8 mg daily   CKD stage IIIa: Serum creatinine at baseline.  Continue lisinopril.   Major depressive disorder: Continue bupropion, buspirone, duloxetine, oxcarbazepine and trazodone.   OSA Continue CPAP at night   HLAB27+ Ankylosing spondylitis lumbar region: Continue oxycodone for pain control.  Discharge Instructions  Discharge Instructions     Call MD for:  difficulty breathing, headache or visual disturbances   Complete by: As directed    Call MD for:  persistant dizziness or light-headedness   Complete by: As directed    Call MD for:  persistant nausea and vomiting   Complete by: As directed    Diet - low sodium heart healthy    Complete by: As directed    Diet Carb Modified   Complete by: As directed    Discharge instructions   Complete by: As directed    Advised to follow-up with primary care physician in 1 week. Advised to take Decadron 6 mg daily for 5 more days.   Increase activity slowly   Complete by: As directed       Allergies as of 10/01/2022       Reactions   Demerol [meperidine] Nausea And Vomiting, Other (See Comments)   Made the patient "violently sick"   Zocor [simvastatin] Nausea And Vomiting, Other (See Comments)   Made him very jittery, also   Beet [beta Vulgaris] Nausea And Vomiting   Collard Greens [wild Lettuce Extract (lactuca Virosa)] Other (See Comments)   Throw up   Liver Nausea And Vomiting        Medication List     STOP taking these medications    methocarbamol 750 MG tablet Commonly known as: ROBAXIN   Mometasone Furoate 200 MCG/ACT Aero   senna-docusate 8.6-50 MG tablet Commonly known as: Senokot-S       TAKE these medications    albuterol (2.5 MG/3ML) 0.083% nebulizer solution Commonly known as: PROVENTIL Inhale 3 mLs (2.5 mg total) by nebulization every 4 (four) hours as needed for wheezing or shortness of breath.   albuterol 108 (90 Base) MCG/ACT inhaler Commonly known as: VENTOLIN HFA Inhale 2 puffs into the lungs every 4 (four) hours as needed for wheezing or shortness of breath.   buPROPion 300 MG 24 hr tablet Commonly known as: WELLBUTRIN XL Take 300 mg by mouth every morning.   busPIRone 15 MG tablet Commonly known as: BUSPAR Take 15 mg by mouth daily.   Carboxymethylcellulose Sod PF 0.5 % Soln Place 1 drop into both eyes 4 (four) times daily as needed (dry eyes).   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   cyclobenzaprine 10 MG tablet Commonly known as: FLEXERIL Take 10 mg by mouth at bedtime.   cycloSPORINE 0.05 % ophthalmic  emulsion Commonly known as: RESTASIS Place 1 drop into both eyes every 12 (twelve) hours.   dexamethasone 6 MG tablet Commonly known as: DECADRON Take 1 tablet (6 mg total) by mouth 2 (two) times daily with a meal for 5 days.   diltiazem 180 MG 24 hr capsule Commonly known as: CARDIZEM CD Take 180 mg by mouth daily.   DULoxetine 60 MG capsule Commonly known as: CYMBALTA Take 1 capsule (60 mg total) by mouth 2 (two) times daily.   Ensure Original Liqd Take 1 Bottle by mouth daily as needed (in between meals).   fluticasone 50 MCG/ACT nasal spray Commonly known as: FLONASE Place 2 sprays into both nostrils daily.   folic acid 1 MG tablet Commonly known as: FOLVITE Take 1 mg by mouth daily.   gabapentin 300 MG capsule Commonly known as: NEURONTIN Take 1 capsule (300 mg total) by mouth 3 (three) times daily for 10 days.   guaiFENesin 600 MG 12 hr tablet Commonly known as: MUCINEX Take 600 mg by mouth 2 (two) times daily as needed for cough.   lidocaine 4 % Commonly known as: HM Lidocaine Patch Place 1 patch onto the skin daily.   lisinopril 20 MG tablet Commonly  known as: ZESTRIL Take 20 mg by mouth daily.   lurasidone 40 MG Tabs tablet Commonly known as: LATUDA Take 40 mg by mouth every evening. After supper   Melatonin 3 MG Caps Take 6 mg by mouth at bedtime as needed (sleep).   metFORMIN 850 MG tablet Commonly known as: GLUCOPHAGE Take 850 mg by mouth daily with breakfast.   mycophenolate 250 MG capsule Commonly known as: CELLCEPT Take 1,500 mg by mouth 2 (two) times daily.   ondansetron 4 MG disintegrating tablet Commonly known as: ZOFRAN-ODT Take 1 tablet (4 mg total) by mouth every 8 (eight) hours as needed for nausea or vomiting.   oxcarbazepine 600 MG tablet Commonly known as: TRILEPTAL Take 600 mg by mouth See admin instructions. Take 1 tablet BID and at bedtime   oxyCODONE 5 MG immediate release tablet Commonly known as: Oxy IR/ROXICODONE Take  1-2 tablets (5-10 mg total) by mouth every 4 (four) hours as needed for severe pain.   polyethylene glycol powder 17 GM/SCOOP powder Commonly known as: GLYCOLAX/MIRALAX Take 17 g by mouth daily.   prednisoLONE acetate 1 % ophthalmic suspension Commonly known as: PRED FORTE Place 1 drop into both eyes See admin instructions. 1 drop every hour while awake   tamsulosin 0.4 MG Caps capsule Commonly known as: FLOMAX Take 0.8 mg by mouth daily.   Tiotropium Bromide-Olodaterol 2.5-2.5 MCG/ACT Aers Inhale 2 each into the lungs every morning. 2 puffs   traZODone 50 MG tablet Commonly known as: DESYREL Take 75 mg by mouth at bedtime.        Follow-up Information     [facility] up in 1 week(s).   Contact information: [address] [facility] [place] [address] [phone]                Allergies  Allergen Reactions   Demerol [Meperidine] Nausea And Vomiting and Other (See Comments)    Made the patient "violently sick"   Zocor [Simvastatin] Nausea And Vomiting and Other (See Comments)    Made him very jittery, also   Beet [Beta Vulgaris] Nausea And Vomiting   Collard Greens [Wild Lettuce Extract (Lactuca Virosa)] Other (See Comments)    Throw up   Liver Nausea And Vomiting    Consultations: None   Procedures/Studies: DG CHEST PORT 1 VIEW  Result Date: 09/27/2022 CLINICAL DATA:  Pneumonia EXAM: PORTABLE CHEST 1 VIEW COMPARISON:  Chest radiograph 09/25/2022 FINDINGS: The heart is enlarged, unchanged. The upper mediastinal contours are stable Left worse than right basilar opacities with associated pleural effusions are again seen, worsened on the left since the prior study. There is no pneumothorax There is no acute osseous abnormality. IMPRESSION: Bilateral pleural effusions and left worse than right basilar airspace opacity, worsened on the left since the prior study from 09/25/2022. Electronically Signed   By: Lesia Hausen M.D.   On:  09/27/2022 12:58   DG Chest 2 View  Result Date: 09/25/2022 CLINICAL DATA:  72 year old male with weakness, suspected sepsis. EXAM: CHEST - 2 VIEW COMPARISON:  Chest radiograph 09/21/2022 and earlier. FINDINGS: Emphysema with left greater than right lower lobe airspace opacity demonstrated on CTA 4 days ago. Semi upright AP and lateral views of the chest now at 0932 hours. Left lung base consolidation persists. Streaky right lung base opacity has not significantly changed. Small bilateral pleural effusions now suspected and new from the recent CTA. Stable cardiac size and mediastinal contours. Visualized tracheal air column is within normal limits. No pulmonary edema.  No pneumothorax. No acute osseous abnormality identified. Paucity of bowel gas in the visible abdomen. IMPRESSION: 1. No improvement in left greater than right lower lobe consolidation since 09/21/2022, and evidence of small new pleural effusions since that time. 2. Underlying emphysema. No other acute cardiopulmonary abnormality. Electronically Signed   By: Odessa Fleming M.D.   On: 09/25/2022 09:53   CT ABDOMEN PELVIS W CONTRAST  Result Date: 09/24/2022 CLINICAL DATA:  Acute abdominal pain. Dizziness and near syncopal episode. Lung carcinoma. * Tracking Code: BO * EXAM: CT ABDOMEN AND PELVIS WITH CONTRAST TECHNIQUE: Multidetector CT imaging of the abdomen and pelvis was performed using the standard protocol following bolus administration of intravenous contrast. RADIATION DOSE REDUCTION: This exam was performed according to the departmental dose-optimization program which includes automated exposure control, adjustment of the mA and/or kV according to patient size and/or use of iterative reconstruction technique. CONTRAST:  OMNIPAQUE IOHEXOL 300 MG/ML  SOLN COMPARISON:  07/02/2022 FINDINGS: Lower Chest: Left lower lobe airspace disease is unchanged. Tiny left lateral basilar pneumothorax remains stable. A small loculated pleural fluid  collection is again seen in the lateral left lung base which measures 5.5 x 2.7 cm. This most likely represents hematoma given several adjacent left rib fractures. Hepatobiliary: No suspicious hepatic masses identified. Small cyst again seen in caudate lobe. Gallbladder is unremarkable. No evidence of biliary ductal dilatation. Pancreas:  No mass or inflammatory changes. Spleen: Within normal limits in size and appearance. Adrenals/Urinary Tract: No suspicious masses identified. No evidence of ureteral calculi or hydronephrosis. Stomach/Bowel: No evidence of obstruction, inflammatory process or abnormal fluid collections. Diverticulosis is seen mainly involving the descending and sigmoid colon, however there is no evidence of diverticulitis. Large stool burden again seen throughout the colon. Vascular/Lymphatic: No pathologically enlarged lymph nodes. No acute vascular findings. Reproductive:  No mass or other significant abnormality. Other:  None. Musculoskeletal:  No suspicious bone lesions identified. IMPRESSION: No acute findings within abdomen or pelvis. No evidence of metastatic disease. Colonic diverticulosis, without radiographic evidence of diverticulitis. Large stool burden again seen throughout the colon. Recommend clinical correlation for possible constipation. Stable tiny left lateral basilar pneumothorax. Stable small loculated pleural fluid collection in lateral left lung base, most likely representing hematoma given several adjacent left rib fractures. Stable left lower lobe airspace disease. Electronically Signed   By: Danae Orleans M.D.   On: 09/24/2022 10:19   DG Chest 2 View  Result Date: 09/21/2022 CLINICAL DATA:  Shortness of breath EXAM: CHEST - 2 VIEW COMPARISON:  Chest x-ray 09/21/2022 FINDINGS: There is platelike opacity in the right lung base, unchanged. Small left pleural effusion and left basilar infiltrates are stable. No pneumothorax. Cardiomediastinal silhouette is unchanged.  IMPRESSION: No significant interval change in the appearance of the chest. Small left pleural effusion and left basilar infiltrates. Electronically Signed   By: Darliss Cheney M.D.   On: 09/21/2022 21:13   CT Angio Chest PE W and/or Wo Contrast  Result Date: 09/21/2022 CLINICAL DATA:  Concern for pulmonary embolism. EXAM: CT ANGIOGRAPHY CHEST WITH CONTRAST TECHNIQUE: Multidetector CT imaging of the chest was performed using the standard protocol during bolus administration of intravenous contrast. Multiplanar CT image reconstructions and MIPs were obtained to evaluate the vascular anatomy. RADIATION DOSE REDUCTION: This exam was performed according to the departmental dose-optimization program which includes automated exposure control, adjustment of the mA and/or kV according to patient size and/or use of iterative reconstruction technique. CONTRAST:  OMNIPAQUE IOHEXOL 350 MG/ML SOLN COMPARISON:  CT  dated 07/02/2022. FINDINGS: Evaluation of this exam is limited due to respiratory motion artifact. Cardiovascular: There is no cardiomegaly or pericardial effusion. Three-vessel coronary vascular calcification. Mild atherosclerotic calcification of the thoracic aorta. No aneurysmal dilatation or dissection. The origins of the great vessels of the aortic arch are patent. Evaluation of the pulmonary arteries is limited due to respiratory motion. No pulmonary artery embolus identified. Mediastinum/Nodes: No hilar or mediastinal adenopathy. The esophagus is grossly unremarkable. No mediastinal fluid collection. Lungs/Pleura: Areas of consolidation involving the lower lobes similar to prior CT. There is background of emphysema. Small loculated left pleural effusion similar to prior CT. No pneumothorax. The central airways are patent. Upper Abdomen: No acute findings. Musculoskeletal: Osteopenia with degenerative changes of the spine. Old left rib fractures. No acute osseous pathology. Review of the MIP images  confirms the above findings. IMPRESSION: 1. No CT evidence of pulmonary embolism. 2. Areas of consolidation involving the lower lobes similar to prior CT. 3. Small loculated left pleural effusion similar to prior CT. 4. Aortic Atherosclerosis (ICD10-I70.0) and Emphysema (ICD10-J43.9). Electronically Signed   By: Elgie Collard M.D.   On: 09/21/2022 02:22   DG Chest Port 1 View  Result Date: 09/21/2022 CLINICAL DATA:  Shortness of breath and chest pain EXAM: PORTABLE CHEST 1 VIEW COMPARISON:  09/11/2022 FINDINGS: Cardiac shadow is enlarged in size. Left-sided pleural effusion is again identified with likely underlying atelectatic changes. Right basilar airspace opacity is again seen. No new focal abnormality is noted. IMPRESSION: Stable appearance when compare with the prior exam. Left-sided effusion and bibasilar airspace opacity is seen. Electronically Signed   By: Alcide Clever M.D.   On: 09/21/2022 00:31   DG Chest 2 View  Result Date: 09/11/2022 CLINICAL DATA:  Generalized weakness. EXAM: CHEST - 2 VIEW COMPARISON:  September 10, 2022 FINDINGS: Cardiomediastinal silhouette is normal. Mediastinal contours appear intact. There is no evidence of pneumothorax. Left lower lobe airspace consolidation and/or pleural effusion. Milder right lower lobe airspace consolidation. Osseous structures are without acute abnormality. Soft tissues are grossly normal. IMPRESSION: 1. Left lower lobe airspace consolidation and/or pleural effusion. 2. Milder right lower lobe airspace consolidation. Electronically Signed   By: Ted Mcalpine M.D.   On: 09/11/2022 15:19   CT Lumbar Spine Wo Contrast  Result Date: 09/10/2022 CLINICAL DATA:  Low back pain after fall 1 week ago EXAM: CT LUMBAR SPINE WITHOUT CONTRAST TECHNIQUE: Multidetector CT imaging of the lumbar spine was performed without intravenous contrast administration. Multiplanar CT image reconstructions were also generated. RADIATION DOSE REDUCTION: This exam was  performed according to the departmental dose-optimization program which includes automated exposure control, adjustment of the mA and/or kV according to patient size and/or use of iterative reconstruction technique. 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 Alignment: No evidence of traumatic malalignment. Vertebrae: No acute fracture or focal pathologic process. Paraspinal and other soft tissues: No acute abnormality. Aortic atherosclerotic calcification. Disc levels: Multilevel spondylosis with bulky anterior osteophytes degenerative disc disease with degenerative endplate changes greatest at L3-L4. No high-grade spinal canal or neural foraminal narrowing. IMPRESSION: No acute fracture. Electronically Signed   By: Minerva Fester M.D.   On: 09/10/2022 20:40   DG Chest Port 1 View  Result Date: 09/10/2022 CLINICAL DATA:  Fall 1 week ago with progressively worsening back pain. EXAM: PORTABLE CHEST 1 VIEW COMPARISON:  Radiograph and CT 06/29/2022 FINDINGS: Stable cardiomediastinal silhouette. Left greater  than right basilar airspace opacities are similar to prior. Small left pleural effusion. No pneumothorax. IMPRESSION: Similar bibasilar airspace opacities compatible with atelectasis or pneumonia. Small left pleural effusion. Electronically Signed   By: Minerva Fester M.D.   On: 09/10/2022 20:19     Subjective: Patient was seen and examined at bedside.  Overnight events noted.   Patient reports doing much better,  wants to be discharged.  Patient being discharged home.  Discharge Exam: Vitals:   10/01/22 0430 10/01/22 0832  BP: 123/82   Pulse: 78   Resp: 18   Temp: 97.7 F (36.5 C)   SpO2: 95% 97%   Vitals:   09/30/22 1208 09/30/22 2005 10/01/22 0430 10/01/22 0832  BP: (!) 166/97 134/83 123/82   Pulse: 91 94 78   Resp:  19 18   Temp:  98.1 F (36.7 C) 98.7 F (37.1 C) 97.7 F (36.5 C)   TempSrc: Oral Oral Oral   SpO2: 93% 94% 95% 97%  Weight:      Height:        General: Pt is alert, awake, not in acute distress Cardiovascular: RRR, S1/S2 +, no rubs, no gallops Respiratory: CTA bilaterally, no wheezing, no rhonchi Abdominal: Soft, NT, ND, bowel sounds + Extremities: no edema, no cyanosis    The results of significant diagnostics from this hospitalization (including imaging, microbiology, ancillary and laboratory) are listed below for reference.     Microbiology: Recent Results (from the past 240 hour(s))  Culture, blood (Routine x 2)     Status: None   Collection Time: 09/25/22 10:00 AM   Specimen: Right Antecubital; Blood  Result Value Ref Range Status   Specimen Description   Final    RIGHT ANTECUBITAL BLOOD Performed at [facility], [address], [place], [place] [address]    Special Requests   Final    BOTTLES DRAWN AEROBIC AND ANAEROBIC Blood Culture adequate volume Performed at [facility], [address] [place], [place] [address]    Culture   Final    NO GROWTH 5 DAYS Performed at [facility], [address] [place], [place] [address]    Report Status 09/30/2022 FINAL  Final  Culture, blood (Routine x 2)     Status: None   Collection Time: 09/25/22 10:12 AM   Specimen: BLOOD RIGHT ARM  Result Value Ref Range Status   Specimen Description   Final    BLOOD RIGHT ARM Performed at [facility], [address], [place], [place] [address]    Special Requests   Final    BOTTLES DRAWN AEROBIC AND ANAEROBIC Blood Culture results may not be optimal due to an excessive volume of blood received in culture bottles Performed at [facility], [address] [place], [place] [address]    Culture   Final    NO GROWTH 5 DAYS Performed at [facility], [address], [place], [place] [address]    Report Status  09/30/2022 FINAL  Final  SARS Coronavirus 2 by RT PCR (hospital order, performed in [facility] hospital lab) *cepheid single result test* Anterior Nasal Swab     Status: Abnormal   Collection Time: 09/25/22 10:30 AM   Specimen: Anterior Nasal Swab  Result Value Ref Range Status   SARS Coronavirus 2 by RT PCR POSITIVE (A) NEGATIVE Final    Comment: (NOTE) SARS-CoV-2 target nucleic acids are DETECTED  SARS-CoV-2 RNA is generally detectable in upper respiratory  specimens  during the acute phase of infection.  Positive results are indicative  of the presence of the identified virus, but do not rule out bacterial infection or co-infection with other pathogens not detected by the test.  Clinical correlation with patient history and  other diagnostic information is necessary to determine patient infection status.  The expected result is negative.  Fact Sheet for Patients:   RoadLapTop.co.za   Fact Sheet for Healthcare Providers:   http://kim-miller.com/    This test is not yet approved or cleared by the [place] FDA and  has been authorized for detection and/or diagnosis of SARS-CoV-2 by FDA under an Emergency Use Authorization (EUA).  This EUA will remain in effect (meaning this test can be used) for the duration of  the COVID-19 declaration under Section 564(b)(1)  of the Act, 21 U.S.C. section 360-bbb-3(b)(1), unless the authorization is terminated or revoked sooner.   Performed at [facility], [address], [place], [place] [address]      Labs: BNP (last 3 results) Recent Labs    09/21/22 0014 09/21/22 1955 09/25/22 1000  BNP 29.9 42.6 20.8   Basic Metabolic Panel: Recent Labs  Lab 09/25/22 1000 09/26/22 0404 09/28/22 0448 09/30/22 0406  NA 135 135 134* 135  K 4.4 4.0 3.9 4.2  CL 100 101 101 98  CO2 25 25 24 28   GLUCOSE 122* 127* 166* 192*  BUN 15 15 19 19   CREATININE 1.00 0.89 0.77 0.84   CALCIUM 8.6* 8.2* 8.4* 8.9  MG 2.2  --  2.0 2.1  PHOS  --   --  3.1 3.2   Liver Function Tests: Recent Labs  Lab 09/25/22 1000 09/26/22 0404  AST 19 17  ALT 16 15  ALKPHOS 87 71  BILITOT 0.4 0.4  PROT 7.4 7.0  ALBUMIN 3.7 3.4*   Recent Labs  Lab 09/25/22 1000  LIPASE 35   No results for input(s): "AMMONIA" in the last 168 hours. CBC: Recent Labs  Lab 09/25/22 1000 09/26/22 0404 09/28/22 0448 09/30/22 0406  WBC 11.6* 8.2 8.8 13.5*  NEUTROABS 10.2*  --   --   --   HGB 13.4 12.3* 13.2 14.6  HCT 42.3 37.6* 41.0 45.4  MCV 96.1 95.9 95.3 95.6  PLT 297 276 293 347   Cardiac Enzymes: No results for input(s): "CKTOTAL", "CKMB", "CKMBINDEX", "TROPONINI" in the last 168 hours. BNP: Invalid input(s): "POCBNP" CBG: Recent Labs  Lab 09/30/22 0752 09/30/22 1201 09/30/22 1656 09/30/22 2100 10/01/22 0748  GLUCAP 129* 120* 214* 254* 155*   D-Dimer No results for input(s): "DDIMER" in the last 72 hours. Hgb A1c No results for input(s): "HGBA1C" in the last 72 hours. Lipid Profile No results for input(s): "CHOL", "HDL", "LDLCALC", "TRIG", "CHOLHDL", "LDLDIRECT" in the last 72 hours. Thyroid function studies No results for input(s): "TSH", "T4TOTAL", "T3FREE", "THYROIDAB" in the last 72 hours.  Invalid input(s): "FREET3" Anemia work up No results for input(s): "VITAMINB12", "FOLATE", "FERRITIN", "TIBC", "IRON", "RETICCTPCT" in the last 72 hours. Urinalysis    Component Value Date/Time   COLORURINE YELLOW 09/25/2022 1030   APPEARANCEUR CLEAR 09/25/2022 1030   LABSPEC 1.023 09/25/2022 1030   PHURINE 6.0 09/25/2022 1030   GLUCOSEU NEGATIVE 09/25/2022 1030   HGBUR NEGATIVE 09/25/2022 1030   BILIRUBINUR NEGATIVE 09/25/2022 1030   KETONESUR NEGATIVE 09/25/2022 1030   PROTEINUR NEGATIVE 09/25/2022 1030   UROBILINOGEN 1.0 01/12/2013 0024   NITRITE NEGATIVE 09/25/2022 1030   LEUKOCYTESUR NEGATIVE 09/25/2022 1030   Sepsis  Labs Recent Labs  Lab 09/25/22 1000  09/26/22 0404 09/28/22 0448 09/30/22 0406  WBC 11.6* 8.2 8.8 13.5*   Microbiology Recent Results (from the past 240 hour(s))  Culture, blood (Routine x 2)     Status: None   Collection Time: 09/25/22 10:00 AM   Specimen: Right Antecubital; Blood  Result Value Ref Range Status   Specimen Description   Final    RIGHT ANTECUBITAL BLOOD Performed at [facility], [address] [place], [place] [address]    Special Requests   Final    BOTTLES DRAWN AEROBIC AND ANAEROBIC Blood Culture adequate volume Performed at [facility], [address] [place], [place] [address]    Culture   Final    NO GROWTH 5 DAYS Performed at [facility], [address], [place], [place] [address]    Report Status 09/30/2022 FINAL  Final  Culture, blood (Routine x 2)     Status: None   Collection Time: 09/25/22 10:12 AM   Specimen: BLOOD RIGHT ARM  Result Value Ref Range Status   Specimen Description   Final    BLOOD RIGHT ARM Performed at [facility], [address], [place], [place] [address]    Special Requests   Final    BOTTLES DRAWN AEROBIC AND ANAEROBIC Blood Culture results may not be optimal due to an excessive volume of blood received in culture bottles Performed at [facility], [address] [place], [place] [address]    Culture   Final    NO GROWTH 5 DAYS Performed at [facility], [address] [place], [place] [address]    Report Status 09/30/2022 FINAL  Final  SARS Coronavirus 2 by RT PCR (hospital order, performed in [facility] hospital lab) *cepheid single result test* Anterior Nasal Swab     Status: Abnormal   Collection Time: 09/25/22 10:30 AM   Specimen: Anterior Nasal Swab  Result Value Ref Range Status   SARS Coronavirus 2 by RT PCR POSITIVE (A) NEGATIVE Final    Comment: (NOTE) SARS-CoV-2 target nucleic acids are DETECTED  SARS-CoV-2 RNA is generally detectable in upper respiratory  specimens  during the acute phase of infection.  Positive results are indicative  of the presence of the identified virus, but do not rule out bacterial infection or co-infection with other pathogens not detected by the test.  Clinical correlation with patient history and  other diagnostic information is necessary to determine patient infection status.  The expected result is negative.  Fact Sheet for Patients:   RoadLapTop.co.za   Fact Sheet for Healthcare Providers:   http://kim-miller.com/    This test is not yet approved or cleared by the [place] FDA and  has been authorized for detection and/or diagnosis of SARS-CoV-2 by FDA under an Emergency Use Authorization (EUA).  This EUA will remain in effect (meaning this test can be used) for the duration of  the COVID-19 declaration under Section 564(b)(1)  of the Act, 21 U.S.C. section 360-bbb-3(b)(1), unless the authorization is terminated or revoked sooner.   Performed at [facility], [address] [place], [place] [address]      Time coordinating discharge: Over 30 minutes  SIGNED:   Willeen Niece, MD  [facility] 10/01/2022, 12:20 PM Pager   If 7PM-7AM, please contact night-coverage

## 2022-10-01 NOTE — Plan of Care (Signed)
  Problem: Activity: Goal: Ability to tolerate increased activity will improve Outcome: Progressing   Problem: Clinical Measurements: Goal: Ability to maintain a body temperature in the normal range will improve Outcome: Progressing   

## 2022-10-01 NOTE — ED Provider Notes (Signed)
[place] EMERGENCY DEPARTMENT AT [facility] Provider Note   CSN: [id number] Arrival date & time: 10/01/22  2021     History  Chief Complaint  Patient presents with   Shortness of Breath    Mario [name] is a 72 y.o. male.  Pt is a 72 yo male with pmhx significant for htn, anxiety, depression, cad, copd on 4L chronically, osa, and lung cancer.  Pt said he was admitted for Covid pneumonia and was d/c today.  He got home and felt more sob than he was when he was admitted.  Pt did complete Redesivir.  Pt did get steroids as well.  Pt was initially started on abx, but they were d/c'd when procalcitonin came back at 0.1.  EMS said his O2 sat was in the 80s on his 4L, so they put him on 15 L NRB.  This was transitioned back to his normal 4L and he is saturating in the mid-90s.           Home Medications Prior to Admission medications   Medication Sig Start Date End Date Taking? Authorizing Provider  albuterol (PROVENTIL) (2.5 MG/3ML) 0.083% nebulizer solution Inhale 3 mLs (2.5 mg total) by nebulization every 4 (four) hours as needed for wheezing or shortness of breath. 08/04/20 10/02/22  Rolly Salter, MD  albuterol (VENTOLIN HFA) 108 (90 Base) MCG/ACT inhaler Inhale 2 puffs into the lungs every 4 (four) hours as needed for wheezing or shortness of breath. 11/04/21   Glade Lloyd, MD  buPROPion (WELLBUTRIN XL) 300 MG 24 hr tablet Take 300 mg by mouth every morning. 02/05/20   [provider]  busPIRone (BUSPAR) 15 MG tablet Take 15 mg by mouth daily.    [provider]  Carboxymethylcellulose Sod PF 0.5 % SOLN Place 1 drop into both eyes 4 (four) times daily as needed (dry eyes).    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    [provider]  cyclobenzaprine (FLEXERIL) 10 MG tablet Take 10 mg by mouth at bedtime.    [provider]  cycloSPORINE (RESTASIS) 0.05 % ophthalmic emulsion Place 1 drop into both eyes every 12 (twelve) hours.    [provider]  dexamethasone (DECADRON) 6 MG tablet Take 1 tablet (6 mg total) by mouth 2 (two) times daily with a meal for 5 days. 10/01/22 10/06/22  Willeen Niece, MD  diltiazem (CARDIZEM CD) 180 MG 24 hr capsule Take 180 mg by mouth daily.    [provider]  DULoxetine (CYMBALTA) 60 MG capsule Take 1 capsule (60 mg total) by mouth 2 (two) times daily. 10/04/21 09/25/22  Earney Navy, NP  fluticasone (FLONASE) 50 MCG/ACT nasal spray Place 2 sprays into both nostrils daily.     [provider]  folic acid (FOLVITE) 1 MG tablet Take 1 mg by mouth daily.    [provider]  gabapentin (NEURONTIN) 300 MG capsule Take 1 capsule (300 mg total) by mouth 3 (three) times daily for 10 days. 09/02/22 09/25/22  Glyn Ade, MD  guaiFENesin (MUCINEX) 600 MG 12 hr tablet Take 600 mg by mouth 2 (two) times daily as needed for cough.    [provider]  lidocaine (HM LIDOCAINE PATCH) 4 % Place 1 patch onto the skin daily. 09/02/22   Glyn Ade, MD  lisinopril (ZESTRIL) 20 MG tablet Take 20 mg by mouth daily.  [provider]  lurasidone (LATUDA) 40 MG TABS tablet Take 40 mg by mouth every evening. After supper    [provider]  Melatonin 3 MG CAPS Take 6 mg by mouth at bedtime as needed (sleep).    [provider]  metFORMIN (GLUCOPHAGE) 850 MG tablet Take 850 mg by mouth daily with breakfast.    [provider]  mycophenolate (CELLCEPT) 250 MG capsule Take 1,500 mg by mouth 2 (two) times daily.    [provider]  Nutritional Supplements (ENSURE ORIGINAL) LIQD Take 1 Bottle by mouth daily as needed (in between meals).    [provider]  ondansetron (ZOFRAN-ODT) 4 MG disintegrating tablet Take 1 tablet (4 mg total) by mouth every 8 (eight) hours as needed for nausea or vomiting.  10/04/21   Earney Navy, NP  oxcarbazepine (TRILEPTAL) 600 MG tablet Take 600 mg by mouth See admin instructions. Take 1 tablet BID and at bedtime    [provider]  oxyCODONE (OXY IR/ROXICODONE) 5 MG immediate release tablet Take 1-2 tablets (5-10 mg total) by mouth every 4 (four) hours as needed for severe pain. 09/10/22   Smitty Knudsen, PA-C  polyethylene glycol powder (GLYCOLAX/MIRALAX) 17 GM/SCOOP powder Take 17 g by mouth daily.    [provider]  prednisoLONE acetate (PRED FORTE) 1 % ophthalmic suspension Place 1 drop into both eyes See admin instructions. 1 drop every hour while awake    [provider]  tamsulosin (FLOMAX) 0.4 MG CAPS capsule Take 0.8 mg by mouth daily.    [provider]  Tiotropium Bromide-Olodaterol 2.5-2.5 MCG/ACT AERS Inhale 2 each into the lungs every morning. 2 puffs    [provider]  traZODone (DESYREL) 50 MG tablet Take 75 mg by mouth at bedtime.    [provider]      Allergies    Demerol [meperidine], Zocor [simvastatin], Beet [beta vulgaris], Collard greens [wild lettuce extract (lactuca virosa)], and Liver    Review of Systems   Review of Systems  Respiratory:  Positive for cough and shortness of breath.   All other systems reviewed and are negative.   Physical Exam Updated Vital Signs BP 103/67   Pulse 95   Temp 98.3 F (36.8 C) (Oral)   Resp 18   Ht 6\' 2"  (1.88 m)   Wt 136.1 kg   SpO2 98%   BMI 38.52 kg/m  Physical Exam Vitals and nursing note reviewed.  Constitutional:      General: He is in acute distress.     Appearance: He is well-developed. He is obese.  HENT:     Head: Normocephalic and atraumatic.     Mouth/Throat:     Mouth: Mucous membranes are moist.     Pharynx: Oropharynx is clear.  Eyes:     Extraocular Movements: Extraocular movements intact.     Pupils: Pupils are equal, round, and reactive to light.  Cardiovascular:     Rate and Rhythm: Regular rhythm.  Tachycardia present.  Pulmonary:     Effort: Tachypnea present.     Breath sounds: Rhonchi present.  Abdominal:     General: Bowel sounds are normal.     Palpations: Abdomen is soft.  Musculoskeletal:        General: Normal range of motion.     Cervical back: Normal range of motion and neck supple.  Skin:    General: Skin is warm.     Capillary Refill: Capillary refill takes less than 2  seconds.  Neurological:     General: No focal deficit present.     Mental Status: He is alert and oriented to person, place, and time.  Psychiatric:        Mood and Affect: Mood normal.        Behavior: Behavior normal.     ED Results / Procedures / Treatments   Labs (all labs ordered are listed, but only abnormal results are displayed) Labs Reviewed  BASIC METABOLIC PANEL - Abnormal; Notable for the following components:      Result Value   Sodium 133 (*)    Glucose, Bld 195 (*)    BUN 25 (*)    All other components within normal limits  CBC WITH DIFFERENTIAL/PLATELET - Abnormal; Notable for the following components:   WBC 21.1 (*)    Neutro Abs 17.9 (*)    Monocytes Absolute 1.3 (*)    Abs Immature Granulocytes 0.22 (*)    All other components within normal limits  BRAIN NATRIURETIC PEPTIDE  PROCALCITONIN    EKG EKG Interpretation Date/Time:  Saturday October 01 2022 20:31:08 EDT Ventricular Rate:  104 PR Interval:  195 QRS Duration:  113 QT Interval:  351 QTC Calculation: 462 R Axis:   -31  Text Interpretation: Sinus tachycardia Borderline IVCD with LAD Low voltage, precordial leads No significant change since last tracing Confirmed by Jacalyn Lefevre [phone]) on 10/01/2022 8:53:32 PM  Radiology DG Chest Port 1 View  Result Date: 10/01/2022 CLINICAL DATA:  Dyspnea EXAM: PORTABLE CHEST 1 VIEW COMPARISON:  09/27/2022 FINDINGS: Interstitial changes in keeping with underlying emphysema again noted, better visualized on CT examination of 09/21/2022. Persistent bibasilar consolidation,  more severe at the left lung base, stable since prior examination. Small left pleural effusion. No pneumothorax. Cardiac size within normal limits. No acute bone abnormality. IMPRESSION: 1. Persistent bibasilar pneumonic consolidation, more severe at the left lung base, similar to prior examination. 2. Small left pleural effusion. 3. Emphysema. Electronically Signed   By: Helyn Numbers M.D.   On: 10/01/2022 21:08    Procedures Procedures    Medications Ordered in ED Medications  ipratropium-albuterol (DUONEB) 0.5-2.5 (3) MG/3ML nebulizer solution 3 mL (3 mLs Nebulization Given 10/01/22 2237)    ED Course/ Medical Decision Making/ A&P                                 Medical Decision Making Amount and/or Complexity of Data Reviewed Labs: ordered. Radiology: ordered.  Risk Prescription drug management.   This patient presents to the ED for concern of sob, this involves an extensive number of treatment options, and is a complaint that carries with it a high risk of complications and morbidity.  The differential diagnosis includes covid, pna, bronchitis, electrolyte abn   Co morbidities that complicate the patient evaluation   htn, anxiety, depression, cad, copd on 4L chronically, osa, and lung cancer   Additional history obtained:  Additional history obtained from epic chart review External records from outside source obtained and reviewed including EMS report   Lab Tests:  I Ordered, and personally interpreted labs.  The pertinent results include:  cbc with wbc elevated at 21.1, bmp with glucose 195, bnp 16.7, procal 0.1   Imaging Studies ordered:  I ordered imaging studies including cxr  I independently visualized and interpreted imaging which showed  Persistent bibasilar pneumonic consolidation, more severe at the  left lung base, similar to prior examination.  2. Small left pleural effusion.  3. Emphysema.   I agree with the radiologist interpretation   Cardiac  Monitoring:  The patient was maintained on a cardiac monitor.  I personally viewed and interpreted the cardiac monitored which showed an underlying rhythm of: nsr   Medicines ordered and prescription drug management:  I ordered medication including duoneb  for sx  Reevaluation of the patient after these medicines showed that the patient improved I have reviewed the patients home medicines and have made adjustments as needed    Problem List / ED Course:  Covid-19:  pt is saturating in the mid-90s on his usual 4L.  Pt is able to ambulate with O2 sats staying in the mid-90s.  He does have a room mate that can help him.  Home health is getting set up for pt; he has to call on Monday.  Pt has already had all the covid tx.  I don't think an additional hospital stay can help pt as he is saturating well.  Procal remains low, so doubt a bacterial pna.  Pt is stable for d/c.  Return if worse.  F/u with pcp.   Reevaluation:  After the interventions noted above, I reevaluated the patient and found that they have :improved   Social Determinants of Health:  Lives at home   Dispostion:  After consideration of the diagnostic results and the patients response to treatment, I feel that the patent would benefit from discharge with outpatient f/u.    Mario [name] was evaluated in Emergency Department on 10/01/2022 for the symptoms described in the history of present illness. He was evaluated in the context of the global COVID-19 pandemic, which necessitated consideration that the patient might be at risk for infection with the SARS-CoV-2 virus that causes COVID-19. Institutional protocols and algorithms that pertain to the evaluation of patients at risk for COVID-19 are in a state of rapid change based on information released by regulatory bodies including the CDC and federal and state organizations. These policies and algorithms were followed during the patient's care in the ED.         Final  Clinical Impression(s) / ED Diagnoses Final diagnoses:  COVID-19  COPD exacerbation [facility])    Rx / DC Orders ED Discharge Orders     None         Jacalyn Lefevre, MD 10/01/22 2308

## 2022-10-01 NOTE — ED Triage Notes (Signed)
Pt BIB [facility] from home c/o [facility] pt is on 4L at baseline pt was diagnosed with COVID on 7/28 and was admitted to the hospital. Pt states he was discharged today but the [facility] got worse and felt worse than when he tested positive. Pt is c/o 2/10 centralized CP. Pt recently started on Lovenox for a hx of PE.

## 2022-10-07 ENCOUNTER — Emergency Department (HOSPITAL_COMMUNITY)
Admission: EM | Admit: 2022-10-07 | Discharge: 2022-10-09 | Disposition: A | Discharge status: Home / Self Care | Payer: Non-veteran care | Attending: Emergency Medicine | Admitting: Emergency Medicine

## 2022-10-07 ENCOUNTER — Other Ambulatory Visit: Payer: Self-pay

## 2022-10-07 ENCOUNTER — Emergency Department (HOSPITAL_COMMUNITY): Payer: Non-veteran care

## 2022-10-07 ENCOUNTER — Encounter (HOSPITAL_COMMUNITY): Payer: Self-pay

## 2022-10-07 DIAGNOSIS — I10 Essential (primary) hypertension: Secondary | ICD-10-CM | POA: Diagnosis not present

## 2022-10-07 DIAGNOSIS — R0602 Shortness of breath: Secondary | ICD-10-CM | POA: Insufficient documentation

## 2022-10-07 DIAGNOSIS — F339 Major depressive disorder, recurrent, unspecified: Secondary | ICD-10-CM | POA: Diagnosis present

## 2022-10-07 DIAGNOSIS — I2510 Atherosclerotic heart disease of native coronary artery without angina pectoris: Secondary | ICD-10-CM | POA: Diagnosis not present

## 2022-10-07 DIAGNOSIS — Z85118 Personal history of other malignant neoplasm of bronchus and lung: Secondary | ICD-10-CM | POA: Diagnosis not present

## 2022-10-07 DIAGNOSIS — R45851 Suicidal ideations: Secondary | ICD-10-CM | POA: Diagnosis not present

## 2022-10-07 DIAGNOSIS — Z79899 Other long term (current) drug therapy: Secondary | ICD-10-CM | POA: Insufficient documentation

## 2022-10-07 DIAGNOSIS — R079 Chest pain, unspecified: Secondary | ICD-10-CM | POA: Diagnosis not present

## 2022-10-07 DIAGNOSIS — F29 Unspecified psychosis not due to a substance or known physiological condition: Secondary | ICD-10-CM | POA: Insufficient documentation

## 2022-10-07 DIAGNOSIS — J449 Chronic obstructive pulmonary disease, unspecified: Secondary | ICD-10-CM | POA: Diagnosis not present

## 2022-10-07 DIAGNOSIS — R0789 Other chest pain: Secondary | ICD-10-CM

## 2022-10-07 DIAGNOSIS — Z7951 Long term (current) use of inhaled steroids: Secondary | ICD-10-CM | POA: Diagnosis not present

## 2022-10-07 NOTE — ED Triage Notes (Signed)
Pt BIBA with a c/o chest pressure and SI w/ a plan of OD on all his meds. Per medic Pt is non-complaint with his HTN meds and was diaphoretic and hypertensive on arrival 186/108. CBG:105. Pt also endorses taking 20mg  of Norco and Medics gave 324mg  ASA and nitroglycerin x1.

## 2022-10-08 ENCOUNTER — Emergency Department (HOSPITAL_COMMUNITY): Payer: Non-veteran care

## 2022-10-08 DIAGNOSIS — R45851 Suicidal ideations: Secondary | ICD-10-CM | POA: Diagnosis not present

## 2022-10-08 LAB — TROPONIN I (HIGH SENSITIVITY): Troponin I (High Sensitivity): 7 ng/L (ref <18)

## 2022-10-08 MED ORDER — LURASIDONE HCL 40 MG PO TABS
40.0000 mg | ORAL_TABLET | Freq: Every evening | ORAL | Status: DC
  Administered 2022-10-08: 40 mg via ORAL
  Filled 2022-10-08 (×2): qty 1

## 2022-10-08 MED ORDER — OXYCODONE-ACETAMINOPHEN 5-325 MG PO TABS
1.0000 | ORAL_TABLET | Freq: Once | ORAL | Status: AC
  Administered 2022-10-08: 1 via ORAL
  Filled 2022-10-08: qty 1

## 2022-10-08 MED ORDER — GABAPENTIN 300 MG PO CAPS
300.0000 mg | ORAL_CAPSULE | Freq: Three times a day (TID) | ORAL | Status: DC
  Administered 2022-10-08 – 2022-10-09 (×4): 300 mg via ORAL
  Filled 2022-10-08 (×4): qty 1

## 2022-10-08 MED ORDER — TAMSULOSIN HCL 0.4 MG PO CAPS
0.8000 mg | ORAL_CAPSULE | Freq: Every day | ORAL | Status: DC
  Administered 2022-10-08 – 2022-10-09 (×2): 0.8 mg via ORAL
  Filled 2022-10-08 (×3): qty 2

## 2022-10-08 MED ORDER — LISINOPRIL 20 MG PO TABS
20.0000 mg | ORAL_TABLET | Freq: Every day | ORAL | Status: DC
  Administered 2022-10-08 – 2022-10-09 (×2): 20 mg via ORAL
  Filled 2022-10-08: qty 2
  Filled 2022-10-08: qty 1

## 2022-10-08 MED ORDER — DILTIAZEM HCL ER COATED BEADS 180 MG PO CP24
180.0000 mg | ORAL_CAPSULE | Freq: Every day | ORAL | Status: DC
  Administered 2022-10-08 – 2022-10-09 (×2): 180 mg via ORAL
  Filled 2022-10-08 (×3): qty 1

## 2022-10-08 MED ORDER — BUSPIRONE HCL 10 MG PO TABS
15.0000 mg | ORAL_TABLET | Freq: Every day | ORAL | Status: DC
  Administered 2022-10-08 – 2022-10-09 (×2): 15 mg via ORAL
  Filled 2022-10-08 (×2): qty 2

## 2022-10-08 MED ORDER — DULOXETINE HCL 30 MG PO CPEP
60.0000 mg | ORAL_CAPSULE | Freq: Two times a day (BID) | ORAL | Status: DC
  Administered 2022-10-08 – 2022-10-09 (×3): 60 mg via ORAL
  Filled 2022-10-08 (×3): qty 2

## 2022-10-08 MED ORDER — TRAZODONE HCL 50 MG PO TABS
75.0000 mg | ORAL_TABLET | Freq: Every day | ORAL | Status: DC
  Administered 2022-10-08: 75 mg via ORAL
  Filled 2022-10-08: qty 2

## 2022-10-08 MED ORDER — MYCOPHENOLATE MOFETIL 250 MG PO CAPS
1500.0000 mg | ORAL_CAPSULE | Freq: Two times a day (BID) | ORAL | Status: DC
  Administered 2022-10-08 – 2022-10-09 (×3): 1500 mg via ORAL
  Filled 2022-10-08 (×4): qty 6

## 2022-10-08 MED ORDER — FLUTICASONE PROPIONATE 50 MCG/ACT NA SUSP
2.0000 | Freq: Every day | NASAL | Status: DC
  Administered 2022-10-08 – 2022-10-09 (×2): 2 via NASAL
  Filled 2022-10-08 (×2): qty 16

## 2022-10-08 MED ORDER — KETOROLAC TROMETHAMINE 15 MG/ML IJ SOLN
15.0000 mg | Freq: Once | INTRAMUSCULAR | Status: AC
  Administered 2022-10-08: 15 mg via INTRAMUSCULAR
  Filled 2022-10-08 (×2): qty 1

## 2022-10-08 MED ORDER — OXCARBAZEPINE 300 MG PO TABS
600.0000 mg | ORAL_TABLET | Freq: Three times a day (TID) | ORAL | Status: DC
  Administered 2022-10-08 – 2022-10-09 (×4): 600 mg via ORAL
  Filled 2022-10-08 (×4): qty 2

## 2022-10-08 MED ORDER — ARFORMOTEROL TARTRATE 15 MCG/2ML IN NEBU
15.0000 ug | INHALATION_SOLUTION | Freq: Two times a day (BID) | RESPIRATORY_TRACT | Status: DC
  Administered 2022-10-08: 15 ug via RESPIRATORY_TRACT
  Filled 2022-10-08 (×4): qty 2

## 2022-10-08 MED ORDER — UMECLIDINIUM BROMIDE 62.5 MCG/ACT IN AEPB
1.0000 | INHALATION_SPRAY | Freq: Every day | RESPIRATORY_TRACT | Status: DC
  Filled 2022-10-08: qty 7

## 2022-10-08 MED ORDER — IOHEXOL 350 MG/ML SOLN
75.0000 mL | Freq: Once | INTRAVENOUS | Status: AC | PRN
  Administered 2022-10-08: 75 mL via INTRAVENOUS

## 2022-10-08 MED ORDER — BUPROPION HCL ER (XL) 150 MG PO TB24
300.0000 mg | ORAL_TABLET | Freq: Every morning | ORAL | Status: DC
  Administered 2022-10-08 – 2022-10-09 (×2): 300 mg via ORAL
  Filled 2022-10-08 (×2): qty 2

## 2022-10-08 MED ORDER — ALBUTEROL SULFATE HFA 108 (90 BASE) MCG/ACT IN AERS: 2.0000 | INHALATION_SPRAY | RESPIRATORY_TRACT | Status: DC | PRN

## 2022-10-08 MED ORDER — METFORMIN HCL 850 MG PO TABS: 850.0000 mg | ORAL_TABLET | Freq: Every day | ORAL | Status: DC

## 2022-10-08 NOTE — Consult Note (Signed)
BH ED ASSESSMENT   Reason for Consult: suicidal  Referring Physician:  Dr. Manus Gunning Patient Identification: Mario [name] MRN:  [id number] ED Chief Complaint: Major depressive disorder, recurrent episode (HCC)  Diagnosis:  Principal Problem:   Major depressive disorder, recurrent episode [facility])   ED Assessment Time Calculation: Start Time: 1100 Stop Time: 1140 Total Time in Minutes (Assessment Completion): 40   Subjective: Mario [name] is a 72 y.o. male patient admitted with medical history significant of anxiety, bronchitis, COPD, radiation therapy for left lung cancer, hypertension, CAD, history of MI, OSA, history of suicide attempt, tension pneumothorax, uveitis, recent treatment for COVID on July 28 presenting with "not feeling safe at home. States he is having thoughts of plan to hurt himself with a plan to overdose.   HPI: Mario [name], 72 y.o., male patient seen face to face by this provider, consulted with Dr. Clovis Riley; and chart reviewed on 10/08/22.  On evaluation Mario [name] is unable to explain why he was having suicidal thoughts, states that he felt like he was in a dream, but unable to explain the dream.  Patient states that he has suicidal thoughts intermittently, says that he has been feeling depressed about his medical conditions. He talked about his multiple health issues especially his stage 4 lung cancer. He reports feeling depressed due to his health issues and has had thoughts of overdosing on his medications.  He states that he did have a suicidal attempt about 15 years ago, stating that he put a plastic bag over his head, but was unable to go through with it.  States that he was admitted to an inpatient psychiatric facility at that time.  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  He states that he does live with a roommate Thayer Ohm, he states his roommate is a Education officer, environmental and he feels comfortable talking with him. He denies prior history of suicide attempts, prior self injurious behaviors and does not own a firearm--denies access to lethal means.  Patient reports that his appetite is fair, and sleep is good says that he feels sometimes he sleeps too much, he gets about 9 to 11 hours of sleep a day.  Patient denies using any drugs or alcohol, past UDS is have been negative, need to collect UDS on this admission, and BAL.   During evaluation Mario [name] is laying in hospital bed in no acute distress, patient is wearing a nasal cannula which is delivered 4 L of oxygen, which he says he wears daily when he is feeling short of breath. On exam he is alert and oriented x4; Pt is beard and mustache are neatly groomed, he's wearing hospital gown. He appears cared for and does not appear disheveled. Pt is clear and coherent and spontaneously engages in conversation. He does not appear delusional, denies audible or visual hallucinations and no concerns he's responding to internal stimulus The patient expresses feelings of depression and sometimes feeling hopelessness. The patient reports a history of auditory hallucinations in the past but cannot recall the last time he experienced them. He denies paranoia or belief that others are trying to harm them.    Past Psychiatric History: depression, anxiety, PTSD  Risk to Self or Others: Risk to Self:  Yes Risk to Others:  no Prior Inpatient Therapy: denies  Prior Outpatient Therapy:  yes, [facility]  B[place]roft Scale:  Flowsheet Row ED from 10/07/2022 in [facility] Emergency Department at [facility] ED from 10/01/2022 in [facility] Emergency Department at [facility] ED to Hosp-Admission (Discharged) from 09/25/2022 in [place] LONG 4TH FLOOR PROGRESSIVE CARE AND UROLOGY   C-SSRS RISK CATEGORY High Risk No Risk No Risk       AIMS:  , , ,  ,   ASAM:    Substance Abuse:     Past Medical History:  Past Medical History:  Diagnosis Date   Ankylosing spondylitis lumbar region (HCC) 09/25/2022   Anxiety    Bronchitis    COPD (chronic obstructive pulmonary disease) (HCC)    Depression    History of radiation therapy    Left lung- 10/05/20-10/15/20- Dr. Antony Blackbird   Hypertension    lung ca 09/2020   MI (myocardial infarction) [facility])    ????   On home oxygen therapy    4L/min [place]   OSA (obstructive sleep apnea)    Suicide attempt (HCC)    Tension pneumothorax 06/27/2016   Uveitis     Past Surgical History:  Procedure Laterality Date   BIOPSY  07/03/2021   Procedure: BIOPSY;  Surgeon: Kathi Der, MD;  Location: [facility] ENDOSCOPY;  Service: Gastroenterology;;   BRONCHIAL BIOPSY  07/30/2020   Procedure: BRONCHIAL BIOPSIES;  Surgeon: Josephine Igo, DO;  Location: [facility];  Service: Pulmonary;;   BRONCHIAL BRUSHINGS  07/30/2020   Procedure: BRONCHIAL BRUSHINGS;  Surgeon: Josephine Igo, DO;  Location: [facility];  Service: Pulmonary;;   BRONCHIAL NEEDLE ASPIRATION BIOPSY  07/30/2020   Procedure: BRONCHIAL NEEDLE ASPIRATION BIOPSIES;  Surgeon: Josephine Igo, DO;  Location: [facility];  Service: Pulmonary;;   BRONCHIAL WASHINGS  07/30/2020   Procedure: BRONCHIAL WASHINGS;  Surgeon: Josephine Igo, DO;  Location: [facility];  Service: Pulmonary;;   CHEST TUBE INSERTION Left 06/27/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     ESOPHAGOGASTRODUODENOSCOPY N/A 07/03/2021   Procedure: ESOPHAGOGASTRODUODENOSCOPY (EGD);  Surgeon: Kathi Der, MD;  Location: [facility];  Service: Gastroenterology;  Laterality: N/A;   IR PERC PLEURAL DRAIN W/INDWELL CATH W/IMG GUIDE  08/04/2021   IR REMOVAL OF PLURAL CATH W/CUFF  09/01/2021   SKIN CANCER EXCISION     VIDEO BRONCHOSCOPY WITH ENDOBRONCHIAL NAVIGATION Left 07/30/2020   Procedure: VIDEO BRONCHOSCOPY WITH ENDOBRONCHIAL  NAVIGATION;  Surgeon: Josephine Igo, DO;  Location: [facility];  Service: Pulmonary;  Laterality: Left;   Family History:  Family History  Problem Relation Age of Onset   Dementia Father     Social History:  Social History   Substance and Sexual Activity  Alcohol Use No   Alcohol/week: 0.0 standard drinks of alcohol   Comment: denies use of any drugs or alcohol     Social History   Substance and Sexual Activity  Drug Use No    Social History   Socioeconomic History   Marital status: Divorced    Spouse name: Not on file   Number of children: Not on file   Years of education: Not on file   Highest education level: Not on file  Occupational History   Occupation: retired  Tobacco Use   Smoking status: Former    Current packs/day: 0.00    Average packs/day: 1 pack/day for 35.0 years (35.0 ttl pk-yrs)    Types: Cigarettes    Start date: 05/1981  Quit date: 05/2016    Years since quitting: 6.3    Passive exposure: Past   Smokeless tobacco: Never  Vaping Use   Vaping status: Never Used  Substance and Sexual Activity   Alcohol use: No    Alcohol/week: 0.0 standard drinks of alcohol    Comment: denies use of any drugs or alcohol   Drug use: No   Sexual activity: Not Currently  Other Topics Concern   Not on file  Social History Narrative   Not on file   Social Determinants of Health   Financial Resource Strain: Low Risk  (09/23/2020)   Overall Financial Resource Strain (CARDIA)    Difficulty of Paying Living Expenses: Not hard at all  Food Insecurity: No Food Insecurity (09/25/2022)   Hunger Vital Sign    Worried About Running Out of Food in the Last Year: Never true    Ran Out of Food in the Last Year: Never true  Transportation Needs: No Transportation Needs (09/25/2022)   PRAPARE - Administrator, Civil Service (Medical): No    Lack of Transportation (Non-Medical): No  Physical Activity: Not on file  Stress: No Stress Concern Present  (09/23/2020)   [employer] of Occupational Health - Occupational Stress Questionnaire    Feeling of Stress : Not at all  Social Connections: Unknown (09/09/2022)   Received from [facility]   Social Network    Social Network: Not on file   Additional Social History:    Allergies:   Allergies  Allergen Reactions   Demerol [Meperidine] Nausea And Vomiting and Other (See Comments)    Made the patient "violently sick"   Zocor [Simvastatin] Nausea And Vomiting and Other (See Comments)    Made him very jittery, also   Beet [Beta Vulgaris] Nausea And Vomiting   Collard Greens [Wild Lettuce Extract (Lactuca Virosa)] Other (See Comments)    Throw up   Liver Nausea And Vomiting    Labs:  Results for orders placed or performed during the hospital encounter of 10/07/22 (from the past 48 hour(s))  CBC with Differential     Status: Abnormal   Collection Time: 10/08/22 12:16 AM  Result Value Ref Range   WBC 11.9 (H) 4.0 - 10.5 K/uL   RBC 4.64 4.22 - 5.81 MIL/uL   Hemoglobin 14.2 13.0 - 17.0 g/dL   HCT 47.8 29.5 - 62.1 %   MCV 95.5 80.0 - 100.0 fL   MCH 30.6 26.0 - 34.0 pg   MCHC 32.1 30.0 - 36.0 g/dL   RDW 30.8 65.7 - 84.6 %   Platelets 292 150 - 400 K/uL   nRBC 0.0 0.0 - 0.2 %   Neutrophils Relative % 67 %   Neutro Abs 8.0 (H) 1.7 - 7.7 K/uL   Lymphocytes Relative 18 %   Lymphs Abs 2.2 0.7 - 4.0 K/uL   Monocytes Relative 11 %   Monocytes Absolute 1.3 (H) 0.1 - 1.0 K/uL   Eosinophils Relative 3 %   Eosinophils Absolute 0.3 0.0 - 0.5 K/uL   Basophils Relative 0 %   Basophils Absolute 0.1 0.0 - 0.1 K/uL   Immature Granulocytes 1 %   Abs Immature Granulocytes 0.07 0.00 - 0.07 K/uL    Comment: Performed at [facility], [address], [place], [place] [address]  Comprehensive metabolic panel     Status: Abnormal   Collection Time: 10/08/22 12:16 AM  Result Value Ref Range   Sodium 136 135 - 145 mmol/L  Potassium 4.4 3.5 - 5.1 mmol/L   Chloride 102 98 -  111 mmol/L   CO2 24 22 - 32 mmol/L   Glucose, Bld 93 70 - 99 mg/dL    Comment: Glucose reference range applies only to samples taken after fasting for at least 8 hours.   BUN 22 8 - 23 mg/dL   Creatinine, Ser 1.61 0.61 - 1.24 mg/dL   Calcium 8.6 (L) 8.9 - 10.3 mg/dL   Total Protein 7.4 6.5 - 8.1 g/dL   Albumin 3.4 (L) 3.5 - 5.0 g/dL   AST 17 15 - 41 U/L   ALT 18 0 - 44 U/L   Alkaline Phosphatase 89 38 - 126 U/L   Total Bilirubin 0.3 0.3 - 1.2 mg/dL   GFR, Estimated >09 >60 mL/min    Comment: (NOTE) Calculated using the CKD-EPI Creatinine Equation (2021)    Anion gap 10 5 - 15    Comment: Performed at [facility], [address], [name], [place] [address]  Troponin I (High Sensitivity)     Status: None   Collection Time: 10/08/22 12:16 AM  Result Value Ref Range   Troponin I (High Sensitivity) 8 <18 ng/L    Comment: (NOTE) Elevated high sensitivity troponin I (hsTnI) values and significant  changes across serial measurements may suggest ACS but many other  chronic and acute conditions are known to elevate hsTnI results.  Refer to the "Links" section for chest pain algorithms and additional  guidance. Performed at [facility], [address] [place], [place] [address]   Brain natriuretic peptide     Status: None   Collection Time: 10/08/22 12:16 AM  Result Value Ref Range   B Natriuretic Peptide 33.8 0.0 - 100.0 pg/mL    Comment: Performed at [facility], [address] [place], [place] [address]  Troponin I (High Sensitivity)     Status: None   Collection Time: 10/08/22  3:06 AM  Result Value Ref Range   Troponin I (High Sensitivity) 7 <18 ng/L    Comment: (NOTE) Elevated high sensitivity troponin I (hsTnI) values and significant  changes across serial measurements may suggest ACS but many other  chronic and acute conditions are known to elevate hsTnI results.  Refer to the "Links" section for chest pain  algorithms and additional  guidance. Performed at [facility], [address] [place], [place] [address]     Current Facility-Administered Medications  Medication Dose Route Frequency Provider Last Rate Last Admin   albuterol (VENTOLIN HFA) 108 (90 Base) MCG/ACT inhaler 2 puff  2 puff Inhalation Q4H PRN Rancour, Jeannett Senior, MD       arformoterol (BROVANA) nebulizer solution 15 mcg  15 mcg Nebulization BID Rancour, Stephen, MD       And   umeclidinium bromide (INCRUSE ELLIPTA) 62.5 MCG/ACT 1 puff  1 puff Inhalation Daily Rancour, Stephen, MD       buPROPion (WELLBUTRIN XL) 24 hr tablet 300 mg  300 mg Oral q morning Rancour, Stephen, MD   300 mg at 10/08/22 1002   busPIRone (BUSPAR) tablet 15 mg  15 mg Oral Daily Rancour, Stephen, MD   15 mg at 10/08/22 1002   diltiazem (CARDIZEM CD) 24 hr capsule 180 mg  180 mg Oral Daily Rancour, Stephen, MD   180 mg at 10/08/22 1123   DULoxetine (CYMBALTA) DR capsule 60 mg  60 mg Oral BID Glynn Octave, MD   60 mg at 10/08/22 1002  fluticasone (FLONASE) 50 MCG/ACT nasal spray 2 spray  2 spray Each Nare Daily Rancour, Stephen, MD   2 spray at 10/08/22 1123   gabapentin (NEURONTIN) capsule 300 mg  300 mg Oral TID Rancour, Jeannett Senior, MD   300 mg at 10/08/22 1002   lisinopril (ZESTRIL) tablet 20 mg  20 mg Oral Daily Rancour, Stephen, MD   20 mg at 10/08/22 1002   lurasidone (LATUDA) tablet 40 mg  40 mg Oral QPM Rancour, Jeannett Senior, MD       [name] ON 10/10/2022] metFORMIN (GLUCOPHAGE) tablet 850 mg  850 mg Oral Q breakfast Rancour, Jeannett Senior, MD       mycophenolate (CELLCEPT) capsule 1,500 mg  1,500 mg Oral BID Rancour, Jeannett Senior, MD   1,500 mg at 10/08/22 1122   Oxcarbazepine (TRILEPTAL) tablet 600 mg  600 mg Oral TID Glynn Octave, MD   600 mg at 10/08/22 1001   tamsulosin (FLOMAX) capsule 0.8 mg  0.8 mg Oral Daily Rancour, Stephen, MD   0.8 mg at 10/08/22 1002   traZODone (DESYREL) tablet 75 mg  75 mg Oral QHS Rancour, Jeannett Senior, MD        Current Outpatient Medications  Medication Sig Dispense Refill   albuterol (PROVENTIL) (2.5 MG/3ML) 0.083% nebulizer solution Inhale 3 mLs (2.5 mg total) by nebulization every 4 (four) hours as needed for wheezing or shortness of breath. 90 mL 2   albuterol (VENTOLIN HFA) 108 (90 Base) MCG/ACT inhaler Inhale 2 puffs into the lungs every 4 (four) hours as needed for wheezing or shortness of breath.     buPROPion (WELLBUTRIN XL) 300 MG 24 hr tablet Take 300 mg by mouth every morning.     busPIRone (BUSPAR) 15 MG tablet Take 15 mg by mouth daily.     Carboxymethylcellulose Sod PF 0.5 % SOLN Place 1 drop into both eyes 4 (four) times daily as needed (dry eyes).     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     cyclobenzaprine (FLEXERIL) 10 MG tablet Take 10 mg by mouth at bedtime.     cycloSPORINE (RESTASIS) 0.05 % ophthalmic emulsion Place 1 drop into both eyes every 12 (twelve) hours.     diltiazem (CARDIZEM CD) 180 MG 24 hr capsule Take 180 mg by mouth daily.     DULoxetine (CYMBALTA) 60 MG capsule Take 1 capsule (60 mg total) by mouth 2 (two) times daily. 60 capsule 0   fluticasone (FLONASE) 50 MCG/ACT nasal spray Place 2 sprays into both nostrils daily.      folic acid (FOLVITE) 1 MG tablet Take 1 mg by mouth daily.     gabapentin (NEURONTIN) 300 MG capsule Take 1 capsule (300 mg total) by mouth 3 (three) times daily for 10 days. 30 capsule 0   guaiFENesin (MUCINEX) 600 MG 12 hr tablet Take 600 mg by mouth 2 (two) times daily as needed for cough.     lidocaine (HM LIDOCAINE PATCH) 4 % Place 1 patch onto the skin daily. 10 patch 0   lisinopril (ZESTRIL) 20 MG tablet Take 20 mg by mouth daily.     lurasidone (LATUDA) 40 MG TABS tablet Take 40 mg by mouth every evening. After supper     Melatonin 3 MG CAPS Take 6 mg by mouth at bedtime as needed (sleep).     metFORMIN (GLUCOPHAGE) 850 MG tablet Take 850 mg  by mouth daily with breakfast.     mycophenolate (  CELLCEPT) 250 MG capsule Take 1,500 mg by mouth 2 (two) times daily.     Nutritional Supplements (ENSURE ORIGINAL) LIQD Take 1 Bottle by mouth daily as needed (in between meals).     ondansetron (ZOFRAN-ODT) 4 MG disintegrating tablet Take 1 tablet (4 mg total) by mouth every 8 (eight) hours as needed for nausea or vomiting. 20 tablet 0   oxcarbazepine (TRILEPTAL) 600 MG tablet Take 600 mg by mouth See admin instructions. Take 1 tablet BID and at bedtime     oxyCODONE (OXY IR/ROXICODONE) 5 MG immediate release tablet Take 1-2 tablets (5-10 mg total) by mouth every 4 (four) hours as needed for severe pain. 30 tablet 0   polyethylene glycol powder (GLYCOLAX/MIRALAX) 17 GM/SCOOP powder Take 17 g by mouth daily.     prednisoLONE acetate (PRED FORTE) 1 % ophthalmic suspension Place 1 drop into both eyes See admin instructions. 1 drop every hour while awake     tamsulosin (FLOMAX) 0.4 MG CAPS capsule Take 0.8 mg by mouth daily.     Tiotropium Bromide-Olodaterol 2.5-2.5 MCG/ACT AERS Inhale 2 each into the lungs every morning. 2 puffs     traZODone (DESYREL) 50 MG tablet Take 75 mg by mouth at bedtime.     dexamethasone (DECADRON) 4 MG tablet Take 6 mg by mouth 2 (two) times daily.      Musculoskeletal:  Patient is observed resting in bed, states he cannot walk very far due to bilateral leg weakness.   Psychiatric Specialty Exam: Presentation  General Appearance:  Disheveled  Eye Contact: Fleeting  Speech: Clear and Coherent  Speech Volume: Normal  Handedness: Right   Mood and Affect  Mood: Depressed  Affect: Appropriate   Thought Process  Thought Processes: Disorganized  Descriptions of Associations:Intact  Orientation:Full (Time, Place and Person)  Thought Content:Scattered  History of Schizophrenia/Schizoaffective disorder:No data recorded Duration of Psychotic Symptoms:No data  recorded Hallucinations:Hallucinations: None  Ideas of Reference:None  Suicidal Thoughts:Suicidal Thoughts: Yes, Passive SI Passive Intent and/or Plan: With Plan  Homicidal Thoughts:Homicidal Thoughts: No   Sensorium  Memory: Immediate Fair; Recent Fair  Judgment: Poor  Insight: Fair   Chartered certified accountant: Fair  Attention Span: Fair  Recall: [employer] of Knowledge: Fair  Language: Fair   Psychomotor Activity  Psychomotor Activity: Psychomotor Activity: Normal   Assets  Assets: Manufacturing systems engineer; Housing; Social Support    Sleep  Sleep: Sleep: Fair   Physical Exam: Physical Exam Vitals and nursing note reviewed. Exam conducted with a chaperone present.  Neurological:     Mental Status: He is alert.  Psychiatric:        Attention and Perception: Attention normal.        Mood and Affect: Mood is depressed.        Speech: Speech normal.        Behavior: Behavior is cooperative.        Thought Content: Thought content includes suicidal ideation. Thought content includes suicidal plan.        Cognition and Memory: Memory normal.        Judgment: Judgment is impulsive.    Review of Systems  Constitutional: Negative.   Psychiatric/Behavioral:  Positive for depression and suicidal ideas.    Blood pressure 135/70, pulse 95, temperature 98.5 F (36.9 C), temperature source Oral, resp. rate 20, height 6\' 2"  (1.88 m), weight (!) 137.4 kg, SpO2 95%. Body mass index is 38.9 kg/m.   Medical Decision Making: Patient case review and discussed with Dr.  Clovis Riley. Patient needs inpatient psychiatric admission for stabilization and treatment.  Patient is continuing to endorse SI with a plan to overdose on his muscle relaxants.  Patient unable to explain why he is having suicidal thoughts but states that he is feeling depressed due to his medical conditions.  Patient is on 4 L of oxygen, also he is 14 days post-COVID. Pt is a veteran and  will be sent to [place] facilities.    Disposition: Recommend psychiatric Inpatient admission.   Alona Bene, PMHNP 10/08/2022 4:39 PM

## 2022-10-08 NOTE — Progress Notes (Signed)
Pt is under review at [facility] per [place] Hall, Care Coordination [phone] The following items are PENDING for review: VA Transfer form and transport form. Per VA Intake benefits have been verified. CSW will follow up with [facility] after 8:00am on 10/08/2022.   Maryjean Ka, MSW, [facility] 10/08/2022 5:35 PM

## 2022-10-08 NOTE — BH Assessment (Signed)
Clinician messaged Nathen May, RN:  "Hey. It's Trey with TTS. Is the pt able to engage in the assessment, if so the pt will need to be placed in a private room. Is the pt under IVC? Also is the pt medically cleared?"   Clinician awaiting response.    Redmond Pulling, MS, [facility], [facility] Triage Specialist [phone]

## 2022-10-08 NOTE — Progress Notes (Addendum)
LCSW Progress Note  [id number]   Mario [name]  10/08/2022  3:40 PM    Inpatient Behavioral Health Placement  Pt meets inpatient criteria per Mario [name], PMHNP. There are no available beds within [facility] per [facility] Mario Strader,RN  Identified barrier to placement is the use of O2, and 14 days COVID.   Addendum: Pt is a veteran and will be sent to [place] facilities.   [facility] (after hours) [facility]  Situation ongoing,  CSW will follow up.    Mario [name], MSW, [name] 10/08/2022 3:40 PM

## 2022-10-08 NOTE — ED Notes (Signed)
Pt belongings in cabinet behind nursing station.

## 2022-10-08 NOTE — ED Provider Notes (Signed)
[place] EMERGENCY DEPARTMENT AT [facility] Provider Note   CSN: [id number] Arrival date & time: 10/07/22  2317     History  Chief Complaint  Patient presents with   Suicidal    Mario [name] is a 72 y.o. male.   72 yrs old Male with medical history significant of anxiety, bronchitis, COPD, radiation therapy for left lung cancer, hypertension, CAD, history of MI, OSA, history of suicide attempt, tension pneumothorax, uveitis, recent treatment for COVID on July 28 presenting with "not feeling safe at home.  States he is having thoughts of plan to hurt himself with a plan to overdose.  He has been feeling this way for several hours.  Was also having increased shortness of breath and chest pain at home.  States he had central chest pressure that lasted for several hours and is now resolved after aspirin.  States the pain was constant for approximately 3 hours.  Associate with increased shortness of breath over his baseline.  He is on 4 L of oxygen normally.  Does have a history of lung cancer as well.  Recently completed course of treatment for COVID-pneumonia with remdesivir.  Denies any cardiac history.  States the pain was in his center of his chest and not exertional and not pleuritic.  He is pain-free now.  Still feels somewhat short of breath and still feeling suicidal.  The history is provided by the patient.       Home Medications Prior to Admission medications   Medication Sig Start Date End Date Taking? Authorizing Provider  albuterol (PROVENTIL) (2.5 MG/3ML) 0.083% nebulizer solution Inhale 3 mLs (2.5 mg total) by nebulization every 4 (four) hours as needed for wheezing or shortness of breath. 08/04/20 10/02/22  Rolly Salter, MD  albuterol (VENTOLIN HFA) 108 (90 Base) MCG/ACT inhaler Inhale 2 puffs into the lungs every 4 (four) hours as needed for wheezing or shortness of breath. 11/04/21   Glade Lloyd, MD  buPROPion (WELLBUTRIN XL) 300 MG 24 hr tablet Take 300 mg  by mouth every morning. 02/05/20   [provider]  busPIRone (BUSPAR) 15 MG tablet Take 15 mg by mouth daily.    [provider]  Carboxymethylcellulose Sod PF 0.5 % SOLN Place 1 drop into both eyes 4 (four) times daily as needed (dry eyes).    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    [provider]  cyclobenzaprine (FLEXERIL) 10 MG tablet Take 10 mg by mouth at bedtime.    [provider]  cycloSPORINE (RESTASIS) 0.05 % ophthalmic emulsion Place 1 drop into both eyes every 12 (twelve) hours.    [provider]  diltiazem (CARDIZEM CD) 180 MG 24 hr capsule Take 180 mg by mouth daily.    [provider]  DULoxetine (CYMBALTA) 60 MG capsule Take 1 capsule (60 mg total) by mouth 2 (two) times daily. 10/04/21 09/25/22  Earney Navy, NP  fluticasone (FLONASE) 50 MCG/ACT nasal spray Place 2 sprays into both nostrils daily.     [provider]  folic acid (FOLVITE) 1 MG tablet Take 1 mg by mouth daily.    [provider]  gabapentin (NEURONTIN) 300 MG capsule Take 1 capsule (300 mg total) by mouth 3 (three) times daily for 10 days. 09/02/22 09/25/22  Glyn Ade, MD  guaiFENesin (MUCINEX) 600 MG 12 hr tablet Take 600 mg by mouth  2 (two) times daily as needed for cough.    [provider]  lidocaine (HM LIDOCAINE PATCH) 4 % Place 1 patch onto the skin daily. 09/02/22   Glyn Ade, MD  lisinopril (ZESTRIL) 20 MG tablet Take 20 mg by mouth daily.    [provider]  lurasidone (LATUDA) 40 MG TABS tablet Take 40 mg by mouth every evening. After supper    [provider]  Melatonin 3 MG CAPS Take 6 mg by mouth at bedtime as needed (sleep).    [provider]  metFORMIN (GLUCOPHAGE) 850 MG tablet Take 850 mg by mouth daily with breakfast.    [provider]   mycophenolate (CELLCEPT) 250 MG capsule Take 1,500 mg by mouth 2 (two) times daily.    [provider]  Nutritional Supplements (ENSURE ORIGINAL) LIQD Take 1 Bottle by mouth daily as needed (in between meals).    [provider]  ondansetron (ZOFRAN-ODT) 4 MG disintegrating tablet Take 1 tablet (4 mg total) by mouth every 8 (eight) hours as needed for nausea or vomiting. 10/04/21   Earney Navy, NP  oxcarbazepine (TRILEPTAL) 600 MG tablet Take 600 mg by mouth See admin instructions. Take 1 tablet BID and at bedtime    [provider]  oxyCODONE (OXY IR/ROXICODONE) 5 MG immediate release tablet Take 1-2 tablets (5-10 mg total) by mouth every 4 (four) hours as needed for severe pain. 09/10/22   Smitty Knudsen, PA-C  polyethylene glycol powder (GLYCOLAX/MIRALAX) 17 GM/SCOOP powder Take 17 g by mouth daily.    [provider]  prednisoLONE acetate (PRED FORTE) 1 % ophthalmic suspension Place 1 drop into both eyes See admin instructions. 1 drop every hour while awake    [provider]  tamsulosin (FLOMAX) 0.4 MG CAPS capsule Take 0.8 mg by mouth daily.    [provider]  Tiotropium Bromide-Olodaterol 2.5-2.5 MCG/ACT AERS Inhale 2 each into the lungs every morning. 2 puffs    [provider]  traZODone (DESYREL) 50 MG tablet Take 75 mg by mouth at bedtime.    [provider]      Allergies    Demerol [meperidine], Zocor [simvastatin], Beet [beta vulgaris], Collard greens [wild lettuce extract (lactuca virosa)], and Liver    Review of Systems   Review of Systems  Constitutional:  Negative for activity change, appetite change and fever.  HENT:  Negative for congestion and rhinorrhea.   Respiratory:  Positive for cough, chest tightness and shortness of breath.   Cardiovascular:  Positive for chest pain.  Gastrointestinal:  Negative for abdominal pain, nausea and vomiting.  Genitourinary:  Negative for dysuria and hematuria.   Musculoskeletal:  Negative for arthralgias and myalgias.  Skin:  Negative for rash.  Neurological:  Negative for dizziness, weakness and headaches.   all other systems are negative except as noted in the HPI and PMH.    Physical Exam Updated Vital Signs BP (!) 168/104 (BP Location: Left Arm)   Pulse 96   Temp 98 F (36.7 C) (Oral)   Resp 18   Ht 6\' 2"  (1.88 m)   Wt (!) 137.4 kg   SpO2 93%   BMI 38.90 kg/m  Physical Exam Vitals and nursing note reviewed.  Constitutional:      General: He is not in acute distress.    Appearance: He is well-developed.     Comments: Flat affect  HENT:     Head: Normocephalic and atraumatic.     Mouth/Throat:  Pharynx: No oropharyngeal exudate.  Eyes:     Conjunctiva/sclera: Conjunctivae normal.     Pupils: Pupils are equal, round, and reactive to light.  Neck:     Comments: No meningismus. Cardiovascular:     Rate and Rhythm: Normal rate and regular rhythm.     Heart sounds: Normal heart sounds. No murmur heard. Pulmonary:     Effort: Pulmonary effort is normal. No respiratory distress.     Breath sounds: Normal breath sounds.  Chest:     Chest wall: No tenderness.  Abdominal:     Palpations: Abdomen is soft.     Tenderness: There is no abdominal tenderness. There is no guarding or rebound.  Musculoskeletal:        General: No tenderness. Normal range of motion.     Cervical back: Normal range of motion and neck supple.  Skin:    General: Skin is warm.  Neurological:     Mental Status: He is alert and oriented to person, place, and time.     Cranial Nerves: No cranial nerve deficit.     Motor: No abnormal muscle tone.     Coordination: Coordination normal.     Comments:  5/5 strength throughout. CN 2-12 intact.Equal grip strength.   Psychiatric:        Behavior: Behavior normal.     ED Results / Procedures / Treatments   Labs (all labs ordered are listed, but only abnormal results are displayed) Labs Reviewed  CBC WITH  DIFFERENTIAL/PLATELET - Abnormal; Notable for the following components:      Result Value   WBC 11.9 (*)    Neutro Abs 8.0 (*)    Monocytes Absolute 1.3 (*)    All other components within normal limits  COMPREHENSIVE METABOLIC PANEL - Abnormal; Notable for the following components:   Calcium 8.6 (*)    Albumin 3.4 (*)    All other components within normal limits  BRAIN NATRIURETIC PEPTIDE  TROPONIN I (HIGH SENSITIVITY)  TROPONIN I (HIGH SENSITIVITY)    EKG EKG Interpretation Date/Time:  Saturday October 08 2022 00:18:20 EDT Ventricular Rate:  88 PR Interval:  207 QRS Duration:  114 QT Interval:  373 QTC Calculation: 452 R Axis:   -31  Text Interpretation: Sinus rhythm Borderline IVCD with LAD No significant change was found Confirmed by Glynn Octave [phone]) on 10/08/2022 12:19:13 AM  Radiology CT Angio Chest PE W and/or Wo Contrast  Result Date: 10/08/2022 CLINICAL DATA:  COVID diagnosis 2 weeks ago, recently discharged from hospital but with steadily worsening chest pain since discharge. EXAM: CT ANGIOGRAPHY CHEST WITH CONTRAST TECHNIQUE: Multidetector CT imaging of the chest was performed using the standard protocol during bolus administration of intravenous contrast. Multiplanar CT image reconstructions and MIPs were obtained to evaluate the vascular anatomy. RADIATION DOSE REDUCTION: This exam was performed according to the departmental dose-optimization program which includes automated exposure control, adjustment of the mA and/or kV according to patient size and/or use of iterative reconstruction technique. CONTRAST:  75mL OMNIPAQUE IOHEXOL 350 MG/ML SOLN COMPARISON:  Portable chest today, portable chest 10/01/2022, portable chest 09/27/2022, and CTA chest 09/21/2022 as well as 07/02/2022. FINDINGS: Cardiovascular: The cardiac size is normal. There is no substantial pericardial effusion. There is patchy three-vessel coronary artery calcific plaque. There is scattered calcific  atherosclerosis in the aorta and great vessels without aneurysm or dissection. No arterial or venous dilatation or arterial embolus is seen. Mediastinum/Nodes: There are scattered borderline prominent mediastinal and a few left hilar borderline prominent  lymph nodes. No enlarged lymph nodes are seen. The thyroid gland, axillary spaces, thoracic trachea and thoracic esophagus are unremarkable. There is a small amount of retained secretions or aspirate in the right main bronchus. Lungs/Pleura: There is moderate to severe emphysematous disease with centrilobular changes predominating and bullous disease in the right lower lobe. There is a small loculated posterolateral left basal pleural effusion, unchanged. Consolidative airspace disease in the left lower lobe, lingular base and posterolateral right lower lobe is also unchanged in appearance compared with both prior studies. There are no new or further infiltrates. No lung nodule is seen. No layering pleural fluid on either side. Upper Abdomen: No acute findings. Mild features of hepatic cirrhosis. Scattered hepatic cysts. Musculoskeletal: Displaced fractures of the lateral left seventh and eighth ribs are again shown. There is extensive bridging enthesopathy of the spine, mild kyphosis. Review of the MIP images confirms the above findings. IMPRESSION: 1. No evidence of arterial dilatation or embolus. 2. Aortic and coronary artery atherosclerosis. 3. Small amount of retained secretions or aspirate in the right main bronchus. 4. Moderate to severe emphysema. 5. Stable small loculated left basal pleural effusion. 6. Consolidative airspace disease in the left lower lobe, lingular base and posterolateral right lower lobe, unchanged. 7. Displaced fractures of the lateral left seventh and eighth ribs, also seen on both prior studies. 8. Hepatic cirrhosis. Aortic Atherosclerosis (ICD10-I70.0) and Emphysema (ICD10-J43.9). Electronically Signed   By: Almira Bar M.D.   On:  10/08/2022 03:17   DG Chest Portable 1 View  Result Date: 10/08/2022 CLINICAL DATA:  Shortness of breath, COVID EXAM: PORTABLE CHEST 1 VIEW COMPARISON:  10/01/2022 FINDINGS: Patchy left mid lung and bilateral lower lobe opacities, mildly progressive, likely related to the patient's known COVID pneumonia. No definite pleural effusions.  No pneumothorax. The heart is top-normal in size. IMPRESSION: Multifocal pneumonia, mildly progressive. Electronically Signed   By: Charline Bills M.D.   On: 10/08/2022 00:43    Procedures Procedures    Medications Ordered in ED Medications - No data to display  ED Course/ Medical Decision Making/ A&P                                 Medical Decision Making Amount and/or Complexity of Data Reviewed Labs: ordered. Decision-making details documented in ED Course. Radiology: ordered and independent interpretation performed. Decision-making details documented in ED Course. ECG/medicine tests: ordered and independent interpretation performed. Decision-making details documented in ED Course.  Risk Prescription drug management.   Patient here with chest pain and shortness of breath now resolved.  Also feeling suicidal.  No hypoxia on his home oxygen.  Lungs are clear.  EKG shows no acute ischemia. Opponent is negative.  Chest x-ray consistent with multifocal pneumonia as compared to previous which is consistent with his recent COVID infection.  CT scan is obtained given his recent COVID diagnosis and chest pain and shortness of breath.  This is negative for pulmonary embolism but does show multifocal infiltrates similar to previous. Stable rib fractures and stable small pleural effusion  Troponin negative x 2.  Low suspicion for ACS. CT the scan as above shows no pulmonary embolism.  Does show emphysema, chronic left pleural effusion, chronic airspace disease consistent with recent COVID infection.  No hypoxia or increased work of breathing on his home  oxygen.  He is medically clear for TTS evaluation.  Patient's respiratory status stable on his home 4 L of  oxygen.  Troponin negative x 2 with low suspicion for ACS.  CT scan as above shows no pulmonary embolism.  Stable changes from his COVID infection.  Medical cleared for TTS evaluation.  Holding orders are placed        Final Clinical Impression(s) / ED Diagnoses Final diagnoses:  None    Rx / DC Orders ED Discharge Orders     None         , Jeannett Senior, MD 10/08/22 [phone]

## 2022-10-09 DIAGNOSIS — R45851 Suicidal ideations: Secondary | ICD-10-CM

## 2022-10-09 NOTE — Discharge Instructions (Addendum)
Discharge recommendations:  Patient is to take medications as prescribed. Please see information for follow-up appointment with psychiatry and therapy. Please follow up with your primary care provider for all medical related needs.   Therapy: We recommend that patient participate in individual therapy to address mental health concerns.  Medications: The patient or guardian is to contact a medical professional and/or outpatient provider to address any new side effects that develop. The patient or guardian should update outpatient providers of any new medications and/or medication changes.   Atypical antipsychotics: If you are prescribed an atypical antipsychotic, it is recommended that your height, weight, BMI, blood pressure, fasting lipid panel, and fasting blood sugar be monitored by your outpatient providers.  Safety:  The patient should abstain from use of illicit substances/drugs and abuse of any medications. If symptoms worsen or do not continue to improve or if the patient becomes actively suicidal or homicidal then it is recommended that the patient return to the closest hospital emergency department, the [facility], or call 911 for further evaluation and treatment. National Suicide Prevention Lifeline 1-800-SUICIDE or 8633134726.  About 988 988 offers 24/7 access to trained crisis counselors who can help people experiencing mental health-related distress. People can call or text 988 or chat 988lifeline.org for themselves or if they are worried about a loved one who may need crisis support.  Crisis Mobile: [facility]:                     401-435-7328 (for crisis response 24 hours a day) [facility] Hotline:                                            6476532310    Safety Plan Mario [name] will reach out to his Mario [name], call 911 or call mobile crisis, or go to nearest emergency room if condition worsens or if suicidal  thoughts become active Patients' will follow up with behavioral health urgent care for outpatient psychiatric services (therapy/medication management).  The suicide prevention education provided includes the following: Suicide risk factors Suicide prevention and interventions National Suicide Hotline telephone number [facility] assessment telephone number [facility] Emergency Assistance 911 [facility] and/or Residential Mobile Crisis Unit telephone number Request made of family/significant other to:  Mario [name] Remove weapons (e.g., guns, rifles, knives), all items previously/currently identified as safety concern.   Remove drugs/medications (over the counter, prescriptions, illicit drugs), all items previously/currently identified as a safety concern.

## 2022-10-09 NOTE — Progress Notes (Signed)
Pt has been psych cleared per Alona Bene, PMHNP. This CSW will now remove from the [facility] shift report. TOC to assist with any discharge needs.   Maryjean Ka, MSW, LCSWA 10/09/2022 1:24 PM

## 2022-10-09 NOTE — Discharge Summary (Signed)
[facility] Psych ED Discharge  10/09/2022 11:52 AM Mario [name]  MRN:  [id number]  Principal Problem: Major depressive disorder, recurrent episode [facility]) Discharge Diagnoses: Principal Problem:   Major depressive disorder, recurrent episode (HCC)  Clinical Impression:  Final diagnoses:  Suicidal ideation  Atypical chest pain    ED Assessment Time Calculation: Start Time: 0930 Stop Time: 0945 Total Time in Minutes (Assessment Completion): 15   Subjective: On today's reassessment patient is seen sitting on the side of his bed eating breakfast, he allows provider to come in and speak with him.  Patient states that he is feeling better today, states that he was feeling physically sick and exhausted due to his pulse COVID illness.  He states being in the hospital has helped him feel much better emotionally, and seeing that life can be short.  Patient currently denies SI/HI/AVH, states that he knows suicide is not the way to help him deal with his medical issues, states he would like to be in a home for seniors where there are other people "like me, and my age."Patient states he currently lives in a boarding home that has 3 levels and he has a hard time maneuvering up and down the different levels, states that the laundry facility is on the lower level in his bedroom is on the upper level, states that he does have a care nurse that comes in and helps him about 9 hours a week but he would like to move into a senior facility.  He states he also gets resources for physical therapy, occupational therapy and a case IT consultant.  Patient states he has 2 other roommates, that he gets along with, one of them is Mario [name] who is a Education officer, environmental he has known her for about a year, gives provider permission to speak with him for collateral.  Mario [name] is a 72 y.o. male patient admitted with medical history significant of anxiety, bronchitis, COPD, radiation therapy for left lung cancer, hypertension, CAD,  history of MI, OSA, history of suicide attempt, tension pneumothorax, uveitis, recent treatment for COVID on July 28 presenting with "not feeling safe at home. States he is having thoughts of plan to hurt himself with a plan to overdose.    On assessment, patient was calm and cooperative for interview process. His eye contact was good. Psychomotor activity was normal. Patient describes his mood as better and his affect is congruent with mood.  Patient does appear much brighter in mood and affect than yesterday's assessment, patient is also more talkative, and more sharing, not as guarded as yesterday. His thought process was normal and he answered questions appropriately, he did not display any flight of ideas. Associations were intact. There were no overt signs of delusions, paranoia, or responding to internal stimuli. Patient's insight and judgment are good. At this time, the patient denies any suicidal ideations, homicidal ideations, auditory hallucinations, visual hallucinations, in addition to denying any paranoia or delusions apparent otherwise. Lastly, patient denies any elevated mood or euphoria that would be concerning for mania. At the conclusion of the evaluation, the patient voiced no other concerns.  Spoke with patient's roommate pastor Mario [name], he stated that he and patient have been roommates for about a year, he does not feel that patient is a danger to self or anyone else.  Mario [name] states that he does see patient every day as their rooms are not too far from each other, he states that he tries to help patient as much as possible,  but patient does have a lot of resources that come into the home that assist him.  Discussed with Mario [name] safety planning to have the patient return home today, as he does not present currently as an imminent risk to self or others at this time.  Mario [name] stated that he would gladly come and pick patient up to return home upon discharge.     Past Psychiatric History:  depression, anxiety, PTSD   Past Medical History:  Past Medical History:  Diagnosis Date   Ankylosing spondylitis lumbar region (HCC) 09/25/2022   Anxiety    Bronchitis    COPD (chronic obstructive pulmonary disease) (HCC)    Depression    History of radiation therapy    Left lung- 10/05/20-10/15/20- Dr. Antony Blackbird   Hypertension    lung ca 09/2020   MI (myocardial infarction) [facility])    ????   On home oxygen therapy    4L/min [place]   OSA (obstructive sleep apnea)    Suicide attempt (HCC)    Tension pneumothorax 06/27/2016   Uveitis     Past Surgical History:  Procedure Laterality Date   BIOPSY  07/03/2021   Procedure: BIOPSY;  Surgeon: Kathi Der, MD;  Location: [facility];  Service: Gastroenterology;;   BRONCHIAL BIOPSY  07/30/2020   Procedure: BRONCHIAL BIOPSIES;  Surgeon: Josephine Igo, DO;  Location: [facility];  Service: Pulmonary;;   BRONCHIAL BRUSHINGS  07/30/2020   Procedure: BRONCHIAL BRUSHINGS;  Surgeon: Josephine Igo, DO;  Location: [facility];  Service: Pulmonary;;   BRONCHIAL NEEDLE ASPIRATION BIOPSY  07/30/2020   Procedure: BRONCHIAL NEEDLE ASPIRATION BIOPSIES;  Surgeon: Josephine Igo, DO;  Location: [facility];  Service: Pulmonary;;   BRONCHIAL WASHINGS  07/30/2020   Procedure: BRONCHIAL WASHINGS;  Surgeon: Josephine Igo, DO;  Location: [facility];  Service: Pulmonary;;   CHEST TUBE INSERTION Left 06/27/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     ESOPHAGOGASTRODUODENOSCOPY N/A 07/03/2021   Procedure: ESOPHAGOGASTRODUODENOSCOPY (EGD);  Surgeon: Kathi Der, MD;  Location: [facility];  Service: Gastroenterology;  Laterality: N/A;   IR PERC PLEURAL DRAIN W/INDWELL CATH W/IMG GUIDE  08/04/2021   IR REMOVAL OF PLURAL CATH W/CUFF  09/01/2021   SKIN CANCER EXCISION     VIDEO BRONCHOSCOPY WITH ENDOBRONCHIAL NAVIGATION Left 07/30/2020   Procedure: VIDEO BRONCHOSCOPY WITH ENDOBRONCHIAL NAVIGATION;  Surgeon: Josephine Igo, DO;  Location: [facility];  Service:  Pulmonary;  Laterality: Left;   Family History:  Family History  Problem Relation Age of Onset   Dementia Father     Social History:  Social History   Substance and Sexual Activity  Alcohol Use No   Alcohol/week: 0.0 standard drinks of alcohol   Comment: denies use of any drugs or alcohol     Social History   Substance and Sexual Activity  Drug Use No    Social History   Socioeconomic History   Marital status: Divorced    Spouse name: Not on file   Number of children: Not on file   Years of education: Not on file   Highest education level: Not on file  Occupational History   Occupation: retired  Tobacco Use   Smoking status: Former    Current packs/day: 0.00    Average packs/day: 1 pack/day for 35.0 years (35.0 ttl pk-yrs)    Types: Cigarettes    Start date: 05/1981    Quit date: 05/2016    Years since quitting: 6.3    Passive exposure: Past   Smokeless tobacco: Never  Vaping  Use   Vaping status: Never Used  Substance and Sexual Activity   Alcohol use: No    Alcohol/week: 0.0 standard drinks of alcohol    Comment: denies use of any drugs or alcohol   Drug use: No   Sexual activity: Not Currently  Other Topics Concern   Not on file  Social History Narrative   Not on file   Social Determinants of Health   Financial Resource Strain: Low Risk  (09/23/2020)   Overall Financial Resource Strain (CARDIA)    Difficulty of Paying Living Expenses: Not hard at all  Food Insecurity: No Food Insecurity (09/25/2022)   Hunger Vital Sign    Worried About Running Out of Food in the Last Year: Never true    Ran Out of Food in the Last Year: Never true  Transportation Needs: No Transportation Needs (09/25/2022)   PRAPARE - Administrator, Civil Service (Medical): No    Lack of Transportation (Non-Medical): No  Physical Activity: Not on file  Stress: No Stress Concern Present (09/23/2020)   [employer] of Occupational Health - Occupational Stress  Questionnaire    Feeling of Stress : Not at all  Social Connections: Unknown (09/09/2022)   [facility]   Social Network    Social Network: Not on file    Tobacco Cessation:  A prescription for an FDA-approved tobacco cessation medication was offered at discharge and the patient refused  Current Medications: Current Facility-Administered Medications  Medication Dose Route Frequency Provider Last Rate Last Admin   albuterol (VENTOLIN HFA) 108 (90 Base) MCG/ACT inhaler 2 puff  2 puff Inhalation Q4H PRN Rancour, Stephen, MD       arformoterol (BROVANA) nebulizer solution 15 mcg  15 mcg Nebulization BID Rancour, Stephen, MD   15 mcg at 10/08/22 2202   And   umeclidinium bromide (INCRUSE ELLIPTA) 62.5 MCG/ACT 1 puff  1 puff Inhalation Daily Rancour, Stephen, MD       buPROPion (WELLBUTRIN XL) 24 hr tablet 300 mg  300 mg Oral q morning Rancour, Stephen, MD   300 mg at 10/09/22 0936   busPIRone (BUSPAR) tablet 15 mg  15 mg Oral Daily Rancour, Stephen, MD   15 mg at 10/09/22 0936   diltiazem (CARDIZEM CD) 24 hr capsule 180 mg  180 mg Oral Daily Rancour, Stephen, MD   180 mg at 10/09/22 2355   DULoxetine (CYMBALTA) DR capsule 60 mg  60 mg Oral BID Rancour, Jeannett Senior, MD   60 mg at 10/09/22 0936   fluticasone (FLONASE) 50 MCG/ACT nasal spray 2 spray  2 spray Each Nare Daily Rancour, Stephen, MD   2 spray at 10/09/22 7322   gabapentin (NEURONTIN) capsule 300 mg  300 mg Oral TID Glynn Octave, MD   300 mg at 10/09/22 0937   lisinopril (ZESTRIL) tablet 20 mg  20 mg Oral Daily Rancour, Stephen, MD   20 mg at 10/09/22 0936   lurasidone (LATUDA) tablet 40 mg  40 mg Oral QPM Rancour, Stephen, MD   40 mg at 10/08/22 1707   [START ON 10/10/2022] metFORMIN (GLUCOPHAGE) tablet 850 mg  850 mg Oral Q breakfast Rancour, Stephen, MD       mycophenolate (CELLCEPT) capsule 1,500 mg  1,500 mg Oral BID Rancour, Stephen, MD   1,500 mg at 10/09/22 0254   Oxcarbazepine (TRILEPTAL) tablet 600 mg  600 mg  Oral TID Glynn Octave, MD   600 mg at 10/09/22 0936   tamsulosin (FLOMAX) capsule 0.8 mg  0.8 mg Oral Daily Rancour, Stephen, MD   0.8 mg at 10/09/22 7829   traZODone (DESYREL) tablet 75 mg  75 mg Oral QHS Rancour, Jeannett Senior, MD   75 mg at 10/08/22 2149   Current Outpatient Medications  Medication Sig Dispense Refill   albuterol (PROVENTIL) (2.5 MG/3ML) 0.083% nebulizer solution Inhale 3 mLs (2.5 mg total) by nebulization every 4 (four) hours as needed for wheezing or shortness of breath. 90 mL 2   albuterol (VENTOLIN HFA) 108 (90 Base) MCG/ACT inhaler Inhale 2 puffs into the lungs every 4 (four) hours as needed for wheezing or shortness of breath.     buPROPion (WELLBUTRIN XL) 300 MG 24 hr tablet Take 300 mg by mouth every morning.     busPIRone (BUSPAR) 15 MG tablet Take 15 mg by mouth daily.     Carboxymethylcellulose Sod PF 0.5 % SOLN Place 1 drop into both eyes 4 (four) times daily as needed (dry eyes).     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     cyclobenzaprine (FLEXERIL) 10 MG tablet Take 10 mg by mouth at bedtime.     cycloSPORINE (RESTASIS) 0.05 % ophthalmic emulsion Place 1 drop into both eyes every 12 (twelve) hours.     diltiazem (CARDIZEM CD) 180 MG 24 hr capsule Take 180 mg by mouth daily.     DULoxetine (CYMBALTA) 60 MG capsule Take 1 capsule (60 mg total) by mouth 2 (two) times daily. 60 capsule 0   fluticasone (FLONASE) 50 MCG/ACT nasal spray Place 2 sprays into both nostrils daily.      folic acid (FOLVITE) 1 MG tablet Take 1 mg by mouth daily.     gabapentin (NEURONTIN) 300 MG capsule Take 1 capsule (300 mg total) by mouth 3 (three) times daily for 10 days. 30 capsule 0   guaiFENesin (MUCINEX) 600 MG 12 hr tablet Take 600 mg by mouth 2 (two) times daily as needed for cough.     lidocaine (HM LIDOCAINE PATCH) 4 % Place 1 patch onto the skin daily. 10 patch 0   lisinopril (ZESTRIL)  20 MG tablet Take 20 mg by mouth daily.     lurasidone (LATUDA) 40 MG TABS tablet Take 40 mg by mouth every evening. After supper     Melatonin 3 MG CAPS Take 6 mg by mouth at bedtime as needed (sleep).     metFORMIN (GLUCOPHAGE) 850 MG tablet Take 850 mg by mouth daily with breakfast.     mycophenolate (CELLCEPT) 250 MG capsule Take 1,500 mg by mouth 2 (two) times daily.     Nutritional Supplements (ENSURE ORIGINAL) LIQD Take 1 Bottle by mouth daily as needed (in between meals).     ondansetron (ZOFRAN-ODT) 4 MG disintegrating tablet Take 1 tablet (4 mg total) by mouth every 8 (eight) hours as needed for nausea or vomiting. 20 tablet 0   oxcarbazepine (TRILEPTAL) 600 MG tablet Take 600 mg by mouth See admin instructions. Take 1 tablet BID and at bedtime     oxyCODONE (OXY IR/ROXICODONE) 5 MG immediate release tablet Take 1-2 tablets (5-10 mg total) by mouth every 4 (four) hours as needed for severe pain. 30 tablet 0   polyethylene glycol powder (GLYCOLAX/MIRALAX) 17 GM/SCOOP powder Take 17 g by mouth daily.     prednisoLONE acetate (PRED FORTE) 1 % ophthalmic suspension Place 1 drop into both eyes See admin instructions. 1 drop every hour while awake  tamsulosin (FLOMAX) 0.4 MG CAPS capsule Take 0.8 mg by mouth daily.     Tiotropium Bromide-Olodaterol 2.5-2.5 MCG/ACT AERS Inhale 2 each into the lungs every morning. 2 puffs     traZODone (DESYREL) 50 MG tablet Take 75 mg by mouth at bedtime.     dexamethasone (DECADRON) 4 MG tablet Take 6 mg by mouth 2 (two) times daily.     PTA Medications: (Not in a hospital admission)   [place] Scale:  Flowsheet Row ED from 10/07/2022 in [facility] Department at [facility] ED from 10/01/2022 in [facility] Department [facility] ED to Hosp-Admission (Discharged) from 09/25/2022 in [facility] LONG 4TH FLOOR PROGRESSIVE CARE AND UROLOGY  C-SSRS RISK CATEGORY High Risk No Risk No Risk       Musculoskeletal:  Observed  patient sitting on the side of the bed.  Psychiatric Specialty Exam: Presentation  General Appearance:  Appropriate for Environment  Eye Contact: Good  Speech: Clear and Coherent  Speech Volume: Normal  Handedness: Right   Mood and Affect  Mood: Euthymic  Affect: Appropriate   Thought Process  Thought Processes: Coherent  Descriptions of Associations:Intact  Orientation:Full (Time, Place and Person)  Thought Content:Logical; WDL  History of Schizophrenia/Schizoaffective disorder:No data recorded Duration of Psychotic Symptoms:No data recorded Hallucinations:Hallucinations: None  Ideas of Reference:None  Suicidal Thoughts:Suicidal Thoughts: No SI Passive Intent and/or Plan: With Plan  Homicidal Thoughts:Homicidal Thoughts: No   Sensorium  Memory: Immediate Fair; Recent Fair  Judgment: Fair  Insight: Good   Executive Functions  Concentration: Fair  Attention Span: Fair  Recall: Good  Fund of Knowledge: Good  Language: Good   Psychomotor Activity  Psychomotor Activity: Psychomotor Activity: Normal   Assets  Assets: Communication Skills; Desire for Improvement; Social Support; Housing   Sleep  Sleep: Sleep: Good    Physical Exam: Physical Exam Vitals and nursing note reviewed. Exam conducted with a chaperone present.  Neurological:     Mental Status: He is alert.  Psychiatric:        Attention and Perception: Attention normal.        Mood and Affect: Mood normal.        Speech: Speech normal.        Behavior: Behavior is cooperative.        Thought Content: Thought content normal.        Judgment: Judgment normal.    Review of Systems  Constitutional: Negative.   Psychiatric/Behavioral: Negative.     Blood pressure 139/83, pulse 89, temperature 98.4 F (36.9 C), temperature source Oral, resp. rate 20, height 6\' 2"  (1.88 m), weight (!) 137.4 kg, SpO2 98%. Body mass index is 38.9 kg/m.   Demographic  Factors:  Male, Age 32 or older, Caucasian, and Low socioeconomic status  Loss Factors: Decline in physical health  Historical Factors: Prior suicide attempts  Risk Reduction Factors:   Sense of responsibility to family, Religious beliefs about death, Living with another person, especially a relative, and Positive social support  Continued Clinical Symptoms:  Depression:   Hopelessness   Suicide Risk:  Mild:  Suicidal ideation of limited frequency, intensity, duration, and specificity.  There are no identifiable plans, no associated intent, mild dysphoria and related symptoms, good self-control (both objective and subjective assessment), few other risk factors, and identifiable protective factors, including available and accessible social support.   Follow-up Information     [facility]. Call in 2 day(s).   Specialty: Urgent Care Why:  Partial Hospitalization Program for therapy please call, i will give them your info and email address. Contact information: [address] [place] [address] [phone]        [facility] Follow up.   Specialty: Behavioral Health Why: If symptoms worsen, As needed Contact information: [address] [place] [address] [phone]                 Medical Decision Making: Patient is psychiatrically cleared. Patient case review and discussed with Dr. Clovis Riley, and patient does not meet inpatient criteria for inpatient psychiatric treatment. At time of discharge, patient denies SI, HI, AVH and can contract for safety. He demonstrated no overt evidence of psychosis or mania. Prior to discharge, he verbalized that they understood warning signs, triggers, and symptoms of worsening mental health and how to access emergency mental health care if they felt it was needed. Patient was instructed to call 911 or return to the emergency room if they experienced any  concerning symptoms after discharge. Patient voiced understanding and agreed to the above.  Patient given resources to follow up with behavioral health urgent care for therapy and medication management. Patient denies access to weapons. Safety planning completed.  No medication changes made.  Patient's roommate Mario [name] will come and pick him up, when patient is ready for discharge.  Will refer patient to [facility] partial hospitalization program for therapy and medication management.  Safety Plan Mario [name] will reach out to his Mario [name], call 911 or call mobile crisis, or go to nearest emergency room if condition worsens or if suicidal thoughts become active Patients' will follow up with behavioral health urgent care for outpatient psychiatric services (therapy/medication management).  The suicide prevention education provided includes the following: Suicide risk factors Suicide prevention and interventions National Suicide Hotline telephone number [facility] assessment telephone number [facility] Emergency Assistance 911 [facility] and/or Residential Mobile Crisis Unit telephone number Request made of family/significant other to:  Mario [name] Remove weapons (e.g., guns, rifles, knives), all items previously/currently identified as safety concern.   Remove drugs/medications (over the counter, prescriptions, illicit drugs), all items previously/currently identified as a safety concern.      Disposition: Patient does not meet criteria for psychiatric inpatient admission. Supportive therapy provided about ongoing stressors. Discussed crisis plan, support from social network, calling 911, coming to the Emergency Department, and calling Suicide Hotline.    MOTLEY-MANGRUM, PMHNP 10/09/2022, 11:52 AM

## 2022-10-09 NOTE — ED Notes (Signed)
Patient resting in bed quietly calm and cooperative.

## 2022-10-09 NOTE — ED Provider Notes (Signed)
Emergency Medicine Observation Re-evaluation Note  Mario [name] is a 71 y.o. male, seen on rounds today.  Pt initially presented to the ED for complaints of Suicidal Currently, the patient is awake alert improved.  Physical Exam  BP 139/83 (BP Location: Right Arm)   Pulse 89   Temp 98.4 F (36.9 C) (Oral)   Resp 20   Ht 1.88 m (6\' 2" )   Wt (!) 137.4 kg   SpO2 98%   BMI 38.90 kg/m  Physical Exam General: WDWN Cardiac: rrr Lungs: no distress Psych: denies si hi  ED Course / MDM  EKG:EKG Interpretation Date/Time:  Saturday October 08 2022 00:18:20 EDT Ventricular Rate:  88 PR Interval:  207 QRS Duration:  114 QT Interval:  373 QTC Calculation: 452 R Axis:   -31  Text Interpretation: Sinus rhythm Borderline IVCD with LAD No significant change was found Confirmed by Glynn Octave [phone]) on 10/08/2022 12:19:13 AM  I have reviewed the labs performed to date as well as medications administered while in observation.  Recent changes in the last 24 hours include psych clears for d/c.  Plan  Current plan is for d/c to home.    Margarita Grizzle, MD 10/09/22 1224

## 2022-10-09 NOTE — Progress Notes (Signed)
Per Alona Bene, PMHNP pt has been psych cleared. This CSW has contacted [facility] to update that pt no longer needs an inpatient behavioral health bed.  Maryjean Ka, MSW, [facility] 10/09/2022 11:24 AM

## 2022-10-16 ENCOUNTER — Emergency Department (HOSPITAL_COMMUNITY): Payer: Non-veteran care

## 2022-10-16 ENCOUNTER — Emergency Department (HOSPITAL_COMMUNITY)
Admission: EM | Admit: 2022-10-16 | Discharge: 2022-10-16 | Disposition: A | Discharge status: Home / Self Care | Payer: Non-veteran care | Attending: Emergency Medicine | Admitting: Emergency Medicine

## 2022-10-16 DIAGNOSIS — J441 Chronic obstructive pulmonary disease with (acute) exacerbation: Secondary | ICD-10-CM | POA: Insufficient documentation

## 2022-10-16 DIAGNOSIS — R059 Cough, unspecified: Secondary | ICD-10-CM | POA: Diagnosis present

## 2022-10-16 LAB — BASIC METABOLIC PANEL
Anion gap: 11 (ref 5–15)
BUN: 33 mg/dL — ABNORMAL HIGH (ref 8–23)
CO2: 22 mmol/L (ref 22–32)
Calcium: 9.1 mg/dL (ref 8.9–10.3)
Chloride: 101 mmol/L (ref 98–111)
Creatinine, Ser: 1.14 mg/dL (ref 0.61–1.24)
GFR, Estimated: >60 mL/min (ref >60)
Glucose, Bld: 238 mg/dL — ABNORMAL HIGH (ref 70–99)
Potassium: 4.2 mmol/L (ref 3.5–5.1)
Sodium: 134 mmol/L — ABNORMAL LOW (ref 135–145)

## 2022-10-16 LAB — CBC WITH DIFFERENTIAL/PLATELET
Abs Immature Granulocytes: 0.29 10*3/uL — ABNORMAL HIGH (ref 0.00–0.07)
Basophils Absolute: 0.1 10*3/uL (ref 0.0–0.1)
Basophils Relative: 0 %
Eosinophils Absolute: 1.8 10*3/uL — ABNORMAL HIGH (ref 0.0–0.5)
Eosinophils Relative: 8 %
HCT: 46.2 % (ref 39.0–52.0)
Hemoglobin: 15 g/dL (ref 13.0–17.0)
Immature Granulocytes: 1 %
Lymphocytes Relative: 13 %
Lymphs Abs: 3 10*3/uL (ref 0.7–4.0)
MCH: 30.8 pg (ref 26.0–34.0)
MCHC: 32.5 g/dL (ref 30.0–36.0)
MCV: 94.9 fL (ref 80.0–100.0)
Monocytes Absolute: 1.6 10*3/uL — ABNORMAL HIGH (ref 0.1–1.0)
Monocytes Relative: 7 %
Neutro Abs: 16 10*3/uL — ABNORMAL HIGH (ref 1.7–7.7)
Neutrophils Relative %: 71 %
Platelets: 346 10*3/uL (ref 150–400)
RBC: 4.87 MIL/uL (ref 4.22–5.81)
RDW: 15.3 % (ref 11.5–15.5)
WBC: 22.9 10*3/uL — ABNORMAL HIGH (ref 4.0–10.5)
nRBC: 0 % (ref 0.0–0.2)

## 2022-10-16 MED ORDER — PREDNISONE 10 MG PO TABS: 40.0000 mg | ORAL_TABLET | Freq: Every day | ORAL | 0 refills | Status: DC

## 2022-10-16 MED ORDER — AZITHROMYCIN 250 MG PO TABS: 250.0000 mg | ORAL_TABLET | Freq: Every day | ORAL | 0 refills | Status: DC

## 2022-10-16 NOTE — ED Provider Notes (Signed)
[place] EMERGENCY DEPARTMENT AT [facility] Provider Note   CSN: [id number] Arrival date & time: 10/16/22  1851     History  Chief Complaint  Patient presents with   Shortness of Breath    Mario [name] is a 72 y.o. male.  72 year old male with prior medical history as detailed below presents for evaluation.   Patient with known history of COPD, on 4 LPM O2 at baseline.   Patient given breathing treatments and solumedrol during transport.   Patient reports increased cough, wheezing times one day.   He feels improved on arrival.     The history is provided by the patient and medical records.       Home Medications Prior to Admission medications   Medication Sig Start Date End Date Taking? Authorizing Provider  albuterol (PROVENTIL) (2.5 MG/3ML) 0.083% nebulizer solution Inhale 3 mLs (2.5 mg total) by nebulization every 4 (four) hours as needed for wheezing or shortness of breath. 08/04/20 10/08/22  Rolly Salter, MD  albuterol (VENTOLIN HFA) 108 (90 Base) MCG/ACT inhaler Inhale 2 puffs into the lungs every 4 (four) hours as needed for wheezing or shortness of breath. 11/04/21   Glade Lloyd, MD  buPROPion (WELLBUTRIN XL) 300 MG 24 hr tablet Take 300 mg by mouth every morning. 02/05/20   [provider]  busPIRone (BUSPAR) 15 MG tablet Take 15 mg by mouth daily.    [provider]  Carboxymethylcellulose Sod PF 0.5 % SOLN Place 1 drop into both eyes 4 (four) times daily as needed (dry eyes).    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    [provider]  cyclobenzaprine (FLEXERIL) 10 MG tablet Take 10 mg by mouth at bedtime.    [provider]  cycloSPORINE (RESTASIS) 0.05 % ophthalmic emulsion Place 1 drop into both eyes every 12 (twelve) hours.    [provider]  dexamethasone (DECADRON) 4 MG  tablet Take 6 mg by mouth 2 (two) times daily. 10/01/22   [provider]  diltiazem (CARDIZEM CD) 180 MG 24 hr capsule Take 180 mg by mouth daily.    [provider]  DULoxetine (CYMBALTA) 60 MG capsule Take 1 capsule (60 mg total) by mouth 2 (two) times daily. 10/04/21 10/08/22  Earney Navy, NP  fluticasone (FLONASE) 50 MCG/ACT nasal spray Place 2 sprays into both nostrils daily.     [provider]  folic acid (FOLVITE) 1 MG tablet Take 1 mg by mouth daily.    [provider]  gabapentin (NEURONTIN) 300 MG capsule Take 1 capsule (300 mg total) by mouth 3 (three) times daily for 10 days. 09/02/22 10/08/22  Glyn Ade, MD  guaiFENesin (MUCINEX) 600 MG 12 hr tablet Take 600 mg by mouth 2 (two) times daily as needed for cough.    [provider]  lidocaine (HM LIDOCAINE PATCH) 4 % Place 1 patch onto the skin daily. 09/02/22   Glyn Ade, MD  lisinopril (ZESTRIL) 20 MG tablet Take 20 mg by mouth daily.    [provider]  lurasidone (LATUDA) 40 MG TABS tablet Take 40 mg by mouth every evening. After supper    [provider]  Melatonin 3 MG CAPS Take 6 mg by mouth at bedtime as needed (sleep).    [provider]  metFORMIN (GLUCOPHAGE) 850 MG tablet Take 850 mg by mouth  daily with breakfast.    [provider]  mycophenolate (CELLCEPT) 250 MG capsule Take 1,500 mg by mouth 2 (two) times daily.    [provider]  Nutritional Supplements (ENSURE ORIGINAL) LIQD Take 1 Bottle by mouth daily as needed (in between meals).    [provider]  ondansetron (ZOFRAN-ODT) 4 MG disintegrating tablet Take 1 tablet (4 mg total) by mouth every 8 (eight) hours as needed for nausea or vomiting. 10/04/21   Earney Navy, NP  oxcarbazepine (TRILEPTAL) 600 MG tablet Take 600 mg by mouth See admin instructions. Take 1 tablet BID and at bedtime    [provider]  oxyCODONE (OXY IR/ROXICODONE) 5 MG  immediate release tablet Take 1-2 tablets (5-10 mg total) by mouth every 4 (four) hours as needed for severe pain. 09/10/22   Smitty Knudsen, PA-C  polyethylene glycol powder (GLYCOLAX/MIRALAX) 17 GM/SCOOP powder Take 17 g by mouth daily.    [provider]  prednisoLONE acetate (PRED FORTE) 1 % ophthalmic suspension Place 1 drop into both eyes See admin instructions. 1 drop every hour while awake    [provider]  tamsulosin (FLOMAX) 0.4 MG CAPS capsule Take 0.8 mg by mouth daily.    [provider]  Tiotropium Bromide-Olodaterol 2.5-2.5 MCG/ACT AERS Inhale 2 each into the lungs every morning. 2 puffs    [provider]  traZODone (DESYREL) 50 MG tablet Take 75 mg by mouth at bedtime.    [provider]      Allergies    Demerol [meperidine], Zocor [simvastatin], Beet [beta vulgaris], Collard greens [wild lettuce extract (lactuca virosa)], and Liver    Review of Systems   Review of Systems  All other systems reviewed and are negative.   Physical Exam Updated Vital Signs BP 131/80   Pulse (!) 109   Temp 98.5 F (36.9 C) (Oral)   Resp 20   SpO2 95%  Physical Exam Vitals and nursing note reviewed.  Constitutional:      General: He is not in acute distress.    Appearance: Normal appearance. He is well-developed.  HENT:     Head: Normocephalic and atraumatic.  Eyes:     Conjunctiva/sclera: Conjunctivae normal.     Pupils: Pupils are equal, round, and reactive to light.  Cardiovascular:     Rate and Rhythm: Normal rate and regular rhythm.     Heart sounds: Normal heart sounds.  Pulmonary:     Effort: Pulmonary effort is normal. No respiratory distress.     Breath sounds: Normal breath sounds.  Abdominal:     General: There is no distension.     Palpations: Abdomen is soft.     Tenderness: There is no abdominal tenderness.  Musculoskeletal:        General: No deformity. Normal range of motion.     Cervical back: Normal range of  motion and neck supple.  Skin:    General: Skin is warm and dry.  Neurological:     General: No focal deficit present.     Mental Status: He is alert and oriented to person, place, and time.     ED Results / Procedures / Treatments   Labs (all labs ordered are listed, but only abnormal results are displayed) Labs Reviewed  BASIC METABOLIC PANEL - Abnormal; Notable for the following components:      Result Value   Sodium 134 (*)    Glucose, Bld 238 (*)    BUN 33 (*)  All other components within normal limits  CBC WITH DIFFERENTIAL/PLATELET - Abnormal; Notable for the following components:   WBC 22.9 (*)    Neutro Abs 16.0 (*)    Monocytes Absolute 1.6 (*)    Eosinophils Absolute 1.8 (*)    Abs Immature Granulocytes 0.29 (*)    All other components within normal limits    EKG EKG Interpretation Date/Time:  Sunday October 16 2022 19:06:10 EDT Ventricular Rate:  114 PR Interval:  172 QRS Duration:  112 QT Interval:  331 QTC Calculation: 456 R Axis:   -33  Text Interpretation: Sinus tachycardia Borderline IVCD with LAD Confirmed by Darice Vicario ([id number]) on 10/16/2022 7:13:10 PM  Radiology DG Chest Port 1 View  Result Date: 10/16/2022 CLINICAL DATA:  Shortness of breath EXAM: PORTABLE CHEST 1 VIEW COMPARISON:  Chest x-ray dated October 05, 2022 FINDINGS: Visualized cardiomediastinal contours are unchanged. Unchanged left-greater-than-right lower lung consolidations. Small left pleural effusion. Possible new right pleural effusion. Emphysema. No evidence of pneumothorax. IMPRESSION: 1. Chronic left-greater-than-right lower lung consolidations, likely due to known primary lung adenocarcinoma. 2. Stable small left pleural effusion. 3. Possible new right pleural effusion. 4. Emphysema. Electronically Signed   By: Leah  Strickland M.D.   On: 10/16/2022 19:53    Procedures Procedures    Medications Ordered in ED Medications - No data to display  ED Course/ Medical Decision  Making/ A&P                                 Medical Decision Making Amount and/or Complexity of Data Reviewed Labs: ordered. Radiology: ordered.  Risk Prescription drug management.    Medical Screen Complete  This patient presented to the ED with complaint of sob/wheezing.  This complaint involves an extensive number of treatment options. The initial differential diagnosis includes, but is not limited to, copd exacerbation, metabolic abnormality, etc.   This presentation is: Acute, Chronic, Self-Limited, Previously Undiagnosed, Uncertain Prognosis, Complicated, Systemic Symptoms, and Threat to Life/Bodily Function  Patient with history of advanced COPD.  Presents with increased wheeze and SOB.   Symptoms improved on arrival after EMS transport.  ED workup without significant acute pathology identified.   Patient feels improved. He desires DC. Strict return precautions given and understood.    Additional history obtained:  External records from outside sources obtained and reviewed including prior ED visits and prior Inpatient records.    Problem List / ED Course:  COPD exacerbation   Reevaluation:  After the interventions noted above, I reevaluated the patient and found that they have: improved   Disposition:  After consideration of the diagnostic results and the patients response to treatment, I feel that the patent would benefit from close out patient followup.          Final Clinical Impression(s) / ED Diagnoses Final diagnoses:  COPD exacerbation (HCC)    Rx / DC Orders ED Discharge Orders          Ordered    azithromycin (ZITHROMAX) 250 MG tablet  Daily        10/16/22 2211    predniSONE (DELTASONE) 10 MG tablet  Daily        08 /18/24 2211              Wynetta Fines, MD 10/16/22 2317

## 2022-10-16 NOTE — Discharge Instructions (Addendum)
Return for any problem.  ?

## 2022-10-16 NOTE — ED Triage Notes (Addendum)
BIBA from home for [facility] since 0100 today. Wears nasal cannula @ 4 lpm at all times. 10 mg albuterol, 1 mg atrovent, 125 mg solu-medrol given PTA 20 g right hand 148/100 BP 116 HR 93% 4 lmp 205 cbg

## 2022-10-19 ENCOUNTER — Emergency Department (HOSPITAL_COMMUNITY): Payer: Medicare PPO

## 2022-10-19 ENCOUNTER — Observation Stay (HOSPITAL_COMMUNITY)
Admission: EM | Admit: 2022-10-19 | Discharge: 2022-10-21 | Disposition: A | Discharge status: Home Health | Payer: No Typology Code available for payment source | Attending: Internal Medicine | Admitting: Internal Medicine

## 2022-10-19 ENCOUNTER — Other Ambulatory Visit: Payer: Self-pay

## 2022-10-19 DIAGNOSIS — F339 Major depressive disorder, recurrent, unspecified: Secondary | ICD-10-CM | POA: Diagnosis present

## 2022-10-19 DIAGNOSIS — Z8616 Personal history of COVID-19: Secondary | ICD-10-CM | POA: Insufficient documentation

## 2022-10-19 DIAGNOSIS — Z85118 Personal history of other malignant neoplasm of bronchus and lung: Secondary | ICD-10-CM | POA: Diagnosis not present

## 2022-10-19 DIAGNOSIS — R61 Generalized hyperhidrosis: Secondary | ICD-10-CM | POA: Diagnosis present

## 2022-10-19 DIAGNOSIS — R0602 Shortness of breath: Secondary | ICD-10-CM | POA: Insufficient documentation

## 2022-10-19 DIAGNOSIS — I10 Essential (primary) hypertension: Secondary | ICD-10-CM | POA: Diagnosis present

## 2022-10-19 DIAGNOSIS — C3492 Malignant neoplasm of unspecified part of left bronchus or lung: Secondary | ICD-10-CM | POA: Diagnosis present

## 2022-10-19 DIAGNOSIS — G4733 Obstructive sleep apnea (adult) (pediatric): Secondary | ICD-10-CM | POA: Diagnosis present

## 2022-10-19 DIAGNOSIS — Z6838 Body mass index (BMI) 38.0-38.9, adult: Secondary | ICD-10-CM | POA: Diagnosis not present

## 2022-10-19 DIAGNOSIS — I2510 Atherosclerotic heart disease of native coronary artery without angina pectoris: Secondary | ICD-10-CM | POA: Diagnosis not present

## 2022-10-19 DIAGNOSIS — Z87891 Personal history of nicotine dependence: Secondary | ICD-10-CM | POA: Insufficient documentation

## 2022-10-19 DIAGNOSIS — N400 Enlarged prostate without lower urinary tract symptoms: Secondary | ICD-10-CM | POA: Diagnosis not present

## 2022-10-19 DIAGNOSIS — E669 Obesity, unspecified: Secondary | ICD-10-CM | POA: Insufficient documentation

## 2022-10-19 DIAGNOSIS — Z1152 Encounter for screening for COVID-19: Secondary | ICD-10-CM | POA: Diagnosis not present

## 2022-10-19 DIAGNOSIS — R000 Tachycardia, unspecified: Secondary | ICD-10-CM | POA: Diagnosis not present

## 2022-10-19 DIAGNOSIS — E119 Type 2 diabetes mellitus without complications: Secondary | ICD-10-CM

## 2022-10-19 DIAGNOSIS — X58XXXA Exposure to other specified factors, initial encounter: Secondary | ICD-10-CM | POA: Insufficient documentation

## 2022-10-19 DIAGNOSIS — J449 Chronic obstructive pulmonary disease, unspecified: Secondary | ICD-10-CM | POA: Diagnosis not present

## 2022-10-19 DIAGNOSIS — J189 Pneumonia, unspecified organism: Principal | ICD-10-CM | POA: Diagnosis present

## 2022-10-19 DIAGNOSIS — S2232XA Fracture of one rib, left side, initial encounter for closed fracture: Secondary | ICD-10-CM | POA: Diagnosis not present

## 2022-10-19 DIAGNOSIS — Z720 Tobacco use: Secondary | ICD-10-CM | POA: Diagnosis present

## 2022-10-19 LAB — COMPREHENSIVE METABOLIC PANEL
ALT: 19 U/L (ref 0–44)
AST: 17 U/L (ref 15–41)
Albumin: 3.7 g/dL (ref 3.5–5.0)
Alkaline Phosphatase: 92 U/L (ref 38–126)
Anion gap: 12 (ref 5–15)
BUN: 22 mg/dL (ref 8–23)
CO2: 24 mmol/L (ref 22–32)
Calcium: 9.2 mg/dL (ref 8.9–10.3)
Chloride: 98 mmol/L (ref 98–111)
Creatinine, Ser: 1.01 mg/dL (ref 0.61–1.24)
GFR, Estimated: >60 mL/min (ref >60)
Glucose, Bld: 144 mg/dL — ABNORMAL HIGH (ref 70–99)
Potassium: 4.9 mmol/L (ref 3.5–5.1)
Sodium: 134 mmol/L — ABNORMAL LOW (ref 135–145)
Total Bilirubin: 0.4 mg/dL (ref 0.3–1.2)
Total Protein: 7.5 g/dL (ref 6.5–8.1)

## 2022-10-19 LAB — CBC WITH DIFFERENTIAL/PLATELET
Abs Immature Granulocytes: 0.19 10*3/uL — ABNORMAL HIGH (ref 0.00–0.07)
Basophils Absolute: 0 10*3/uL (ref 0.0–0.1)
Basophils Relative: 0 %
Eosinophils Absolute: 0.3 10*3/uL (ref 0.0–0.5)
Eosinophils Relative: 2 %
HCT: 48.4 % (ref 39.0–52.0)
Hemoglobin: 15.7 g/dL (ref 13.0–17.0)
Immature Granulocytes: 1 %
Lymphocytes Relative: 8 %
Lymphs Abs: 1.1 10*3/uL (ref 0.7–4.0)
MCH: 30.8 pg (ref 26.0–34.0)
MCHC: 32.4 g/dL (ref 30.0–36.0)
MCV: 95.1 fL (ref 80.0–100.0)
Monocytes Absolute: 0.5 10*3/uL (ref 0.1–1.0)
Monocytes Relative: 4 %
Neutro Abs: 11.8 10*3/uL — ABNORMAL HIGH (ref 1.7–7.7)
Neutrophils Relative %: 85 %
Platelets: 380 10*3/uL (ref 150–400)
RBC: 5.09 MIL/uL (ref 4.22–5.81)
RDW: 15.2 % (ref 11.5–15.5)
WBC: 13.9 10*3/uL — ABNORMAL HIGH (ref 4.0–10.5)
nRBC: 0 % (ref 0.0–0.2)

## 2022-10-19 LAB — SARS CORONAVIRUS 2 BY RT PCR: SARS Coronavirus 2 by RT PCR: NEGATIVE

## 2022-10-19 LAB — D-DIMER, QUANTITATIVE: D-Dimer, Quant: 0.33 ug{FEU}/mL (ref 0.00–0.50)

## 2022-10-19 LAB — BRAIN NATRIURETIC PEPTIDE: B Natriuretic Peptide: 30.6 pg/mL (ref 0.0–100.0)

## 2022-10-19 LAB — TROPONIN I (HIGH SENSITIVITY)
Troponin I (High Sensitivity): 10 ng/L (ref <18)
Troponin I (High Sensitivity): 8 ng/L (ref <18)

## 2022-10-19 MED ORDER — LACTATED RINGERS IV SOLN: INTRAVENOUS | Status: DC

## 2022-10-19 MED ORDER — SODIUM CHLORIDE 0.9 % IV SOLN
500.0000 mg | Freq: Once | INTRAVENOUS | Status: AC
  Administered 2022-10-19: 500 mg via INTRAVENOUS
  Filled 2022-10-19: qty 5

## 2022-10-19 MED ORDER — SODIUM CHLORIDE 0.9 % IV SOLN
1.0000 g | Freq: Once | INTRAVENOUS | Status: AC
  Administered 2022-10-19: 1 g via INTRAVENOUS
  Filled 2022-10-19: qty 10

## 2022-10-19 MED ORDER — ONDANSETRON HCL 4 MG/2ML IJ SOLN
4.0000 mg | Freq: Once | INTRAMUSCULAR | Status: AC
  Administered 2022-10-19: 4 mg via INTRAVENOUS
  Filled 2022-10-19: qty 2

## 2022-10-19 MED ORDER — ACETAMINOPHEN 325 MG PO TABS
650.0000 mg | ORAL_TABLET | Freq: Once | ORAL | Status: AC
  Administered 2022-10-19: 650 mg via ORAL
  Filled 2022-10-19: qty 2

## 2022-10-19 MED ORDER — VANCOMYCIN HCL 2000 MG/400ML IV SOLN
2000.0000 mg | Freq: Once | INTRAVENOUS | Status: DC
  Filled 2022-10-19: qty 400

## 2022-10-19 MED ORDER — SODIUM CHLORIDE 0.9 % IV SOLN: 2.0000 g | Freq: Once | INTRAVENOUS | Status: DC

## 2022-10-19 MED ORDER — IOHEXOL 300 MG/ML  SOLN
75.0000 mL | Freq: Once | INTRAMUSCULAR | Status: AC | PRN
  Administered 2022-10-19: 75 mL via INTRAVENOUS

## 2022-10-19 NOTE — ED Notes (Signed)
Patient transported to CT 

## 2022-10-19 NOTE — ED Provider Notes (Incomplete)
[place] EMERGENCY DEPARTMENT AT [facility] Provider Note   CSN: [id number] Arrival date & time: 10/19/22  1742     History Chief Complaint  Patient presents with  . diaphoresis    Mario [name] is a 72 y.o. male. Patient with past medical history significant for anxiety, bronchitis, COPD, left lung cancer treated with radiation therapy, HTN, CAD, prior MI, OSA, and recent COVID infection presents to the ED with concerns of diaphoresis. States that he woke up this morning drenched in sweat. Recently seen in the ED and discharged with Zithromax for concern of pneumonia. Also recently admitted on 09/25/2022 for HAP. Denies any chest pain, nausea, vomiting, or diarrhea at this time. States that SOB is typically worsened with exertion. Has had numerous CT angiograms recently showing no findings of PE.  HPI     Home Medications Prior to Admission medications   Medication Sig Start Date End Date Taking? Authorizing Provider  albuterol (PROVENTIL) (2.5 MG/3ML) 0.083% nebulizer solution Inhale 3 mLs (2.5 mg total) by nebulization every 4 (four) hours as needed for wheezing or shortness of breath. 08/04/20 10/08/22  Rolly Salter, MD  albuterol (VENTOLIN HFA) 108 (90 Base) MCG/ACT inhaler Inhale 2 puffs into the lungs every 4 (four) hours as needed for wheezing or shortness of breath. 11/04/21   Glade Lloyd, MD  azithromycin (ZITHROMAX) 250 MG tablet Take 1 tablet (250 mg total) by mouth daily. Take first 2 tablets together, then 1 every day until finished. 10/16/22   Wynetta Fines, MD  buPROPion (WELLBUTRIN XL) 300 MG 24 hr tablet Take 300 mg by mouth every morning. 02/05/20   [provider]  busPIRone (BUSPAR) 15 MG tablet Take 15 mg by mouth daily.    [provider]  Carboxymethylcellulose Sod PF 0.5 % SOLN Place 1 drop into both eyes 4 (four) times daily as needed (dry eyes).    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    [provider]  cyclobenzaprine (FLEXERIL) 10 MG tablet Take 10 mg by mouth at bedtime.    [provider]  cycloSPORINE (RESTASIS) 0.05 % ophthalmic emulsion Place 1 drop into both eyes every 12 (twelve) hours.    [provider]  dexamethasone (DECADRON) 4 MG tablet Take 6 mg by mouth 2 (two) times daily. 10/01/22   [provider]  diltiazem (CARDIZEM CD) 180 MG 24 hr capsule Take 180 mg by mouth daily.    [provider]  DULoxetine (CYMBALTA) 60 MG capsule Take 1 capsule (60 mg total) by mouth 2 (two) times daily. 10/04/21 10/08/22  Earney Navy, NP  fluticasone (FLONASE) 50 MCG/ACT nasal spray Place 2 sprays into both nostrils daily.     [provider]  folic acid (FOLVITE) 1 MG tablet Take 1 mg by mouth daily.    [provider]  gabapentin (NEURONTIN) 300 MG capsule Take 1 capsule (300 mg total) by mouth 3 (three) times daily for 10 days. 09/02/22 10/08/22  Glyn Ade, MD  guaiFENesin (MUCINEX) 600 MG 12 hr tablet Take 600 mg by mouth 2 (two) times daily as needed for cough.    [provider]  lidocaine (HM LIDOCAINE PATCH) 4 % Place 1 patch onto the skin daily. 09/02/22   Glyn Ade, MD  lisinopril (ZESTRIL) 20 MG tablet Take 20 mg by mouth daily.    [provider]  lurasidone (LATUDA) 40 MG TABS tablet Take 40 mg by mouth every evening. After supper    [provider]  Melatonin 3 MG CAPS Take 6 mg by mouth at bedtime as needed (sleep).    [provider]  metFORMIN (GLUCOPHAGE) 850 MG tablet Take 850 mg by mouth daily with breakfast.    [provider]  mycophenolate (CELLCEPT) 250 MG capsule Take 1,500 mg by mouth 2 (two) times daily.    [provider]  Nutritional Supplements (ENSURE ORIGINAL) LIQD Take 1 Bottle by mouth daily as needed (in between meals).    [provider]  ondansetron (ZOFRAN-ODT) 4 MG disintegrating tablet Take 1 tablet (4 mg total) by mouth every 8 (eight) hours as needed for nausea or vomiting. 10/04/21   Earney Navy, NP  oxcarbazepine (TRILEPTAL) 600 MG tablet Take 600 mg by mouth See admin instructions. Take 1 tablet BID and at bedtime    [provider]  oxyCODONE (OXY IR/ROXICODONE) 5 MG immediate release tablet Take 1-2 tablets (5-10 mg total) by mouth every 4 (four) hours as needed for severe pain. 09/10/22   Smitty Knudsen, PA-C  polyethylene glycol powder (GLYCOLAX/MIRALAX) 17 GM/SCOOP powder Take 17 g by mouth daily.    [provider]  prednisoLONE acetate (PRED FORTE) 1 % ophthalmic suspension Place 1 drop into both eyes See admin instructions. 1 drop every hour while awake    [provider]  predniSONE (DELTASONE) 10 MG tablet Take 4 tablets (40 mg total) by mouth daily. 10/16/22   Wynetta Fines, MD  tamsulosin (FLOMAX) 0.4 MG CAPS capsule Take 0.8 mg by mouth daily.    [provider]  Tiotropium Bromide-Olodaterol 2.5-2.5 MCG/ACT AERS Inhale 2 each into the lungs every morning. 2 puffs    [provider]  traZODone (DESYREL) 50 MG tablet Take 75 mg by mouth at bedtime.    [provider]      Allergies    Demerol [meperidine], Zocor [simvastatin], Beet [beta vulgaris], Collard greens [wild lettuce extract (lactuca virosa)], and Liver    Review of Systems   Review of Systems  All other systems reviewed and are negative.   Physical Exam Updated Vital Signs BP (!) 159/91   Pulse 96   Temp 98.7 F (37.1 C) (Oral)   Resp 20   Ht 6\' 2"  (1.88 m)   Wt 136.1 kg   SpO2 94%   BMI 38.52 kg/m  Physical Exam Vitals and nursing note reviewed.  Constitutional:      General: He is not in acute distress.    Appearance: He is well-developed.  HENT:     Head: Normocephalic and atraumatic.  Eyes:     Conjunctiva/sclera: Conjunctivae normal.   Cardiovascular:     Rate and Rhythm: Normal rate and regular rhythm.     Heart sounds: No murmur heard. Pulmonary:     Effort: Pulmonary effort is normal. No respiratory distress.     Breath sounds: Normal breath sounds. No wheezing.     Comments: Slight crackles in lung bases Abdominal:     Palpations: Abdomen is soft.     Tenderness: There is no abdominal tenderness.  Musculoskeletal:        General: No swelling.     Cervical back: Neck supple.  Skin:    General: Skin is warm and dry.     Capillary Refill: Capillary refill takes less than 2 seconds.  Neurological:     Mental Status: He  is alert.  Psychiatric:        Mood and Affect: Mood normal.     ED Results / Procedures / Treatments   Labs (all labs ordered are listed, but only abnormal results are displayed) Labs Reviewed  COMPREHENSIVE METABOLIC PANEL  CBC WITH DIFFERENTIAL/PLATELET  BRAIN NATRIURETIC PEPTIDE  TROPONIN I (HIGH SENSITIVITY)    EKG None  Radiology No results found.  Procedures Procedures   Medications Ordered in ED Medications  lactated ringers infusion (has no administration in time range)  ondansetron (ZOFRAN) injection 4 mg (has no administration in time range)    ED Course/ Medical Decision Making/ A&P Clinical Course as of 10/19/22 2239  Wed Oct 19, 2022  2233 DG Chest [place] 1 View [OZ]    Clinical Course User Index [OZ] Smitty Knudsen, PA-C                               Medical Decision Making Amount and/or Complexity of Data Reviewed Labs: ordered. Radiology: ordered. Decision-making details documented in ED Course. ECG/medicine tests: ordered.  Risk OTC drugs. Prescription drug management. Decision regarding hospitalization.   This patient presents to the ED for concern of diaphoresis. Differential diagnosis includes ACS, MI, pneumonia, sepsis, viral URI   Lab Tests:  I Ordered, and personally interpreted labs.  The pertinent results include:  CBC with  leukocytosis that is downtrending compared to prior now 13.9, CMP unremarkable, troponin at 8, BNP at 30.6, and d-dimer negative at 0.33. COVID-19 pending   Imaging Studies ordered:  I ordered imaging studies including chest xray, CT chest  I independently visualized and interpreted imaging which showed emphysematous lungs with possible infiltrate, multifocal pneumonia worsening compared to prior CT angio I agree with the radiologist interpretation   Medicines ordered and prescription drug management:  I ordered medication including fluids, Tylenol, Zofran, Zithromax, Rocephin for pain, nausea, pneumonia  Reevaluation of the patient after these medicines showed that the patient improved I have reviewed the patients home medicines and have made adjustments as needed   Problem List / ED Course:  Patient presented to the ED with concerns of diaphoresis. Denies any chest pain, nausea, vomiting, dizziness, or other symptoms at the same time. Was recently seen in the ED for SOB and discharged home with Zithromax which he has been taking, but no clear improvement in symptoms. CBC, CMP, troponin, BNP, COVID-19, and d-dimer ordered for assessment of symptoms. CXR and EKG ordered for evaluation as well. CBC with mild leukocytosis noted. CMP largely unremarkable. BNP and troponin are negative and d-dimer is negative as well. Lungs sound surprisingly well and patient continues to deny any chest pain. CT ordered given concern of worsening infection on CXR. CT chest shows worsening pneumonia, now multifocal. Given finding, will initiate IV antibiotic therapy. Will plan on admission to hospitalist. Informed patient of plan and he is in agreement. Not currently in any acute distress and appears comfortable at baseline 4L via [place].  Final Clinical Impression(s) / ED Diagnoses Final diagnoses:  None    Rx / DC Orders ED Discharge Orders     None

## 2022-10-19 NOTE — ED Triage Notes (Signed)
Pt arrives from home via [facility] with reports of diaphoresis since this monring, presenting with wet hair. Denies known cause or recent sick exposure. Hx of copd, on baseline 4L [place].

## 2022-10-19 NOTE — ED Provider Notes (Signed)
[place] [facility] AT [facility] Provider Note   CSN: [id number] Arrival date & time: 10/19/22  1742     History Chief Complaint  Patient presents with   diaphoresis    GIVEN CHIDESTER is a 72 y.o. male. Patient with past medical history significant for anxiety, bronchitis, COPD, left lung cancer treated with radiation therapy, HTN, CAD, prior MI, OSA, and recent COVID infection presents to the ED with concerns of diaphoresis. States that he woke up this morning drenched in sweat. Recently seen in the ED and discharged with Zithromax for concern of pneumonia. Also recently admitted on 09/25/2022 for HAP. Denies any chest pain, nausea, vomiting, or diarrhea at this time. States that SOB is typically worsened with exertion. Has had numerous CT angiograms recently showing no findings of PE.  HPI     Home Medications Prior to Admission medications   Medication Sig Start Date End Date Taking? Authorizing Provider  albuterol (PROVENTIL) (2.5 MG/3ML) 0.083% nebulizer solution Inhale 3 mLs (2.5 mg total) by nebulization every 4 (four) hours as needed for wheezing or shortness of breath. 08/04/20 10/20/22 Yes Rolly Salter, MD  albuterol (VENTOLIN HFA) 108 (90 Base) MCG/ACT inhaler Inhale 2 puffs into the lungs every 4 (four) hours as needed for wheezing or shortness of breath. 11/04/21  Yes Glade Lloyd, MD  azithromycin (ZITHROMAX) 250 MG tablet Take 1 tablet (250 mg total) by mouth daily. Take first 2 tablets together, then 1 every day until finished. 10/16/22  Yes Wynetta Fines, MD  buPROPion (WELLBUTRIN XL) 300 MG 24 hr tablet Take 300 mg by mouth every morning. 02/05/20  Yes [provider]  busPIRone (BUSPAR) 15 MG tablet Take 15 mg by mouth daily.   Yes [provider]  Carboxymethylcellulose Sod PF 0.5 % SOLN Place 1 drop into both eyes 4 (four) times daily as needed (dry eyes).   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   Yes [provider]  cyclobenzaprine (FLEXERIL) 10 MG tablet Take 10 mg by mouth at bedtime.   Yes [provider]  cycloSPORINE (RESTASIS) 0.05 % ophthalmic emulsion Place 1 drop into both eyes every 12 (twelve) hours.   Yes [provider]  diltiazem (CARDIZEM CD) 180 MG 24 hr capsule Take 180 mg by mouth daily.   Yes [provider]  DULoxetine (CYMBALTA) 60 MG capsule Take 1 capsule (60 mg total) by mouth 2 (two) times daily. 10/04/21 10/20/22 Yes Earney Navy, NP  fluticasone (FLONASE) 50 MCG/ACT nasal spray Place 2 sprays into both nostrils daily.    Yes [provider]  folic acid (FOLVITE) 1 MG tablet Take 1 mg by mouth daily.   Yes [provider]  gabapentin (NEURONTIN) 100 MG capsule Take 300 mg by mouth 3 (three) times daily.   Yes [provider]  lisinopril (ZESTRIL) 20 MG tablet Take 20 mg by mouth daily.   Yes [provider]  lurasidone (LATUDA) 40 MG TABS tablet Take 40 mg by mouth every evening. After supper   Yes [provider]  metFORMIN (GLUCOPHAGE) 850 MG tablet Take 850 mg by mouth daily with breakfast.   Yes [provider]  mycophenolate (CELLCEPT) 250 MG capsule Take 1,500 mg by mouth 2 (two) times daily.   Yes [provider]  Nutritional Supplements (ENSURE ORIGINAL) LIQD Take 1 Bottle by mouth  daily as needed (in between meals).   Yes [provider]  oxcarbazepine (TRILEPTAL) 600 MG tablet Take 600 mg by mouth See admin instructions. Take 1 tablet BID and at bedtime   Yes [provider]  oxyCODONE (OXY IR/ROXICODONE) 5 MG immediate release tablet Take 1-2 tablets (5-10 mg total) by mouth every 4 (four) hours as needed for severe pain. 09/10/22  Yes Smitty Knudsen, PA-C  predniSONE (DELTASONE) 10 MG tablet Take 4 tablets (40 mg total) by mouth daily.  10/16/22  Yes Wynetta Fines, MD  tamsulosin (FLOMAX) 0.4 MG CAPS capsule Take 0.8 mg by mouth daily.   Yes [provider]  Tiotropium Bromide-Olodaterol 2.5-2.5 MCG/ACT AERS Inhale 2 each into the lungs every morning. 2 puffs   Yes [provider]  traZODone (DESYREL) 50 MG tablet Take 75 mg by mouth at bedtime.   Yes [provider]  gabapentin (NEURONTIN) 300 MG capsule Take 1 capsule (300 mg total) by mouth 3 (three) times daily for 10 days. 09/02/22 10/08/22  Glyn Ade, MD  Melatonin 3 MG CAPS Take 6 mg by mouth at bedtime as needed (sleep).    [provider]  ondansetron (ZOFRAN-ODT) 4 MG disintegrating tablet Take 1 tablet (4 mg total) by mouth every 8 (eight) hours as needed for nausea or vomiting. Patient not taking: Reported on 10/20/2022 10/04/21   Earney Navy, NP  polyethylene glycol powder (GLYCOLAX/MIRALAX) 17 GM/SCOOP powder Take 17 g by mouth daily.    [provider]  prednisoLONE acetate (PRED FORTE) 1 % ophthalmic suspension Place 1 drop into both eyes See admin instructions. 1 drop every hour while awake Patient not taking: Reported on 10/20/2022    [provider]      Allergies    Demerol [meperidine], Zocor [simvastatin], Beet [beta vulgaris], Collard greens [wild lettuce extract (lactuca virosa)], and Liver    Review of Systems   Review of Systems  All other systems reviewed and are negative.   Physical Exam Updated Vital Signs BP (!) 109/57 (BP Location: Left Arm)   Pulse (!) 106   Temp 98 F (36.7 C) (Oral)   Resp 18   Ht 6\' 2"  (1.88 m)   Wt 136.1 kg   SpO2 95%   BMI 38.52 kg/m  Physical Exam Vitals and nursing note reviewed.  Constitutional:      General: He is not in acute distress.    Appearance: He is well-developed.  HENT:     Head: Normocephalic and atraumatic.  Eyes:     Conjunctiva/sclera: Conjunctivae normal.  Cardiovascular:     Rate and Rhythm: Normal rate and regular  rhythm.     Heart sounds: No murmur heard. Pulmonary:     Effort: Pulmonary effort is normal. No respiratory distress.     Breath sounds: Normal breath sounds. No wheezing.     Comments: Slight crackles in lung bases Abdominal:     Palpations: Abdomen is soft.     Tenderness: There is no abdominal tenderness.  Musculoskeletal:        General: No swelling.     Cervical back: Neck supple.  Skin:    General: Skin is warm and dry.     Capillary Refill: Capillary refill takes less than 2 seconds.  Neurological:     Mental Status: He is alert.  Psychiatric:        Mood and Affect: Mood normal.     ED Results / Procedures / Treatments   Labs (  all labs ordered are listed, but only abnormal results are displayed) Labs Reviewed  COMPREHENSIVE METABOLIC PANEL - Abnormal; Notable for the following components:      Result Value   Sodium 134 (*)    Glucose, Bld 144 (*)    All other components within normal limits  CBC WITH DIFFERENTIAL/PLATELET - Abnormal; Notable for the following components:   WBC 13.9 (*)    Neutro Abs 11.8 (*)    Abs Immature Granulocytes 0.19 (*)    All other components within normal limits  CBC - Abnormal; Notable for the following components:   WBC 11.4 (*)    MCV 100.6 (*)    All other components within normal limits  BASIC METABOLIC PANEL - Abnormal; Notable for the following components:   Glucose, Bld 170 (*)    All other components within normal limits  SARS CORONAVIRUS 2 BY RT PCR  CULTURE, BLOOD (ROUTINE X 2)  CULTURE, BLOOD (ROUTINE X 2)  BRAIN NATRIURETIC PEPTIDE  D-DIMER, QUANTITATIVE  STREP PNEUMONIAE URINARY ANTIGEN  CREATININE, SERUM  PROCALCITONIN  MAGNESIUM  TSH  LEGIONELLA PNEUMOPHILA SEROGP 1 UR AG  T4, FREE  TROPONIN I (HIGH SENSITIVITY)  TROPONIN I (HIGH SENSITIVITY)    EKG EKG Interpretation Date/Time:  Wednesday October 19 2022 19:02:39 EDT Ventricular Rate:  93 PR Interval:  205 QRS Duration:  111 QT Interval:  363 QTC  Calculation: 452 R Axis:   -31  Text Interpretation: Sinus rhythm Premature ventricular complexes Left axis deviation Abnormal R-wave progression, early transition Confirmed by Gloris Manchester [phone]) on 10/19/2022 7:27:55 PM  Radiology CT Chest W Contrast  Result Date: 10/19/2022 CLINICAL DATA:  Respiratory illness. EXAM: CT CHEST WITH CONTRAST TECHNIQUE: Multidetector CT imaging of the chest was performed during intravenous contrast administration. RADIATION DOSE REDUCTION: This exam was performed according to the departmental dose-optimization program which includes automated exposure control, adjustment of the mA and/or kV according to patient size and/or use of iterative reconstruction technique. CONTRAST:  75mL OMNIPAQUE IOHEXOL 300 MG/ML  SOLN COMPARISON:  Chest x-ray same day.  CT angiogram chest 10/08/2022. FINDINGS: Cardiovascular: No significant vascular findings. Normal heart size. No pericardial effusion. There is mild calcified atherosclerotic disease throughout the aorta and within the coronary arteries. Mediastinum/Nodes: No enlarged mediastinal, hilar, or axillary lymph nodes. Thyroid gland, trachea, and esophagus demonstrate no significant findings. Lungs/Pleura: Moderate emphysematous changes are present. Airspace consolidation is again seen in the bilateral lower lobes, left greater than right. This is increased in the right lower lobe. There is a small amount of airspace disease in the lingula which is unchanged. There is no pleural effusion or pneumothorax. Trachea and central airways are patent. Upper Abdomen: 2.4 cm right renal cyst is again noted. There is also small cyst in the caudate lobe of the liver measuring 9 mm, unchanged. Musculoskeletal: Lateral left sixth and seventh rib fractures are mildly displaced and appear unchanged/subacute. Multilevel degenerative changes affect the spine. Bridging enthesophytes are seen in the cervicothoracic spine, unchanged. IMPRESSION: 1. Bilateral  lower lobe airspace consolidation concerning for multifocal pneumonia. This is increased in the right lower lobe in the interval. Follow-up imaging recommended to confirm complete resolution. 2. Stable airspace disease in the lingula. 3. Stable subacute left sixth and seventh rib fractures. 4. Aortic atherosclerosis. Aortic Atherosclerosis (ICD10-I70.0). Electronically Signed   By: Darliss Cheney M.D.   On: 10/19/2022 21:33   DG Chest Port 1 View  Result Date: 10/19/2022 CLINICAL DATA:  Diaphoresis. EXAM: PORTABLE CHEST 1 VIEW COMPARISON:  October 16, 2022 FINDINGS: The heart size and mediastinal contours are within normal limits. There is evidence of emphysematous lung disease with persistent moderate severity areas of bibasilar atelectasis and/or infiltrate. This is mildly increased in severity within the right lung base when compared to the prior study. Small, stable bilateral pleural effusions are seen. No pneumothorax is identified. Mildly displaced seventh and eighth left-sided rib fractures are noted. IMPRESSION: 1. Emphysematous lung disease with moderate severity bibasilar atelectasis and/or infiltrate. 2. Small, stable bilateral pleural effusions. 3. Mildly displaced seventh and eighth left-sided rib fractures. Electronically Signed   By: Aram Candela M.D.   On: 10/19/2022 19:17    Procedures Procedures   Medications Ordered in ED Medications  benzonatate (TESSALON) capsule 100 mg (100 mg Oral Given 10/20/22 0915)  guaiFENesin (ROBITUSSIN) 100 MG/5ML liquid 5 mL (has no administration in time range)  albuterol (PROVENTIL) (2.5 MG/3ML) 0.083% nebulizer solution 2.5 mg (has no administration in time range)  enoxaparin (LOVENOX) injection 40 mg (40 mg Subcutaneous Given 10/20/22 0916)  acetaminophen (TYLENOL) tablet 650 mg (has no administration in time range)    Or  acetaminophen (TYLENOL) suppository 650 mg (has no administration in time range)  senna-docusate (Senokot-S) tablet 1 tablet  (has no administration in time range)  buPROPion (WELLBUTRIN XL) 24 hr tablet 300 mg (300 mg Oral Given 10/20/22 0915)  busPIRone (BUSPAR) tablet 15 mg (15 mg Oral Given 10/20/22 0916)  cyclobenzaprine (FLEXERIL) tablet 10 mg (has no administration in time range)  diltiazem (CARDIZEM CD) 24 hr capsule 180 mg (180 mg Oral Given 10/20/22 0953)  DULoxetine (CYMBALTA) DR capsule 60 mg (60 mg Oral Given 10/20/22 0915)  gabapentin (NEURONTIN) capsule 300 mg (300 mg Oral Given 10/20/22 0915)  Oxcarbazepine (TRILEPTAL) tablet 600 mg (600 mg Oral Given 10/20/22 0915)  tamsulosin (FLOMAX) capsule 0.8 mg (0.8 mg Oral Given 10/20/22 0915)  traZODone (DESYREL) tablet 75 mg (has no administration in time range)  arformoterol (BROVANA) nebulizer solution 15 mcg (15 mcg Nebulization Given 10/20/22 0827)    And  umeclidinium bromide (INCRUSE ELLIPTA) 62.5 MCG/ACT 1 puff (1 puff Inhalation Not Given 10/20/22 0827)  melatonin tablet 6 mg (has no administration in time range)  folic acid (FOLVITE) tablet 1 mg (1 mg Oral Given 10/20/22 0916)  lurasidone (LATUDA) tablet 40 mg (has no administration in time range)  oxyCODONE (Oxy IR/ROXICODONE) immediate release tablet 5 mg (has no administration in time range)  ondansetron (ZOFRAN) injection 4 mg (4 mg Intravenous Given 10/19/22 1909)  iohexol (OMNIPAQUE) 300 MG/ML solution 75 mL (75 mLs Intravenous Contrast Given 10/19/22 2038)  acetaminophen (TYLENOL) tablet 650 mg (650 mg Oral Given 10/19/22 2233)  cefTRIAXone (ROCEPHIN) 1 g in sodium chloride 0.9 % 100 mL IVPB (0 g Intravenous Stopped 10/20/22 0532)  azithromycin (ZITHROMAX) 500 mg in sodium chloride 0.9 % 250 mL IVPB (0 mg Intravenous Stopped 10/19/22 2352)    ED Course/ Medical Decision Making/ A&P Clinical Course as of 10/20/22 1436  Wed Oct 19, 2022  2233 DG Chest [place] 1 View [OZ]    Clinical Course User Index [OZ] Smitty Knudsen, PA-C                               Medical Decision Making Amount and/or  Complexity of Data Reviewed Labs: ordered. Radiology: ordered. Decision-making details documented in ED Course. ECG/medicine tests: ordered.  Risk OTC drugs. Prescription drug management. Decision regarding hospitalization.   This patient  presents to the ED for concern of diaphoresis. Differential diagnosis includes ACS, MI, pneumonia, sepsis, viral URI   Lab Tests:  I Ordered, and personally interpreted labs.  The pertinent results include:  CBC with leukocytosis that is downtrending compared to prior now 13.9, CMP unremarkable, troponin at 8, BNP at 30.6, and d-dimer negative at 0.33. COVID-19 pending   Imaging Studies ordered:  I ordered imaging studies including chest xray, CT chest  I independently visualized and interpreted imaging which showed emphysematous lungs with possible infiltrate, multifocal pneumonia worsening compared to prior CT angio I agree with the radiologist interpretation   Medicines ordered and prescription drug management:  I ordered medication including fluids, Tylenol, Zofran, Zithromax, Rocephin for pain, nausea, pneumonia  Reevaluation of the patient after these medicines showed that the patient improved I have reviewed the patients home medicines and have made adjustments as needed   Problem List / ED Course:  Patient presented to the ED with concerns of diaphoresis. Denies any chest pain, nausea, vomiting, dizziness, or other symptoms at the same time. Was recently seen in the ED for SOB and discharged home with Zithromax which he has been taking, but no clear improvement in symptoms. CBC, CMP, troponin, BNP, COVID-19, and d-dimer ordered for assessment of symptoms. CXR and EKG ordered for evaluation as well. CBC with mild leukocytosis noted. CMP largely unremarkable. BNP and troponin are negative and d-dimer is negative as well. Lungs sound surprisingly well and patient continues to deny any chest pain. CT ordered given concern of worsening  infection on CXR. CT chest shows worsening pneumonia, now multifocal. Given finding, will initiate IV antibiotic therapy. Will plan on admission to hospitalist. Informed patient of plan and he is in agreement. Not currently in any acute distress and appears comfortable at baseline 4L via [place].  0030 Care of Serita Sheller transferred to [facility] and Dr. Madilyn Hook at the end of my shift as the patient will require reassessment once labs/imaging have resulted. Patient presentation, ED course, and plan of care discussed with review of all pertinent labs and imaging. Please see his/her note for further details regarding further ED course and disposition. Plan at time of handoff is await hospitalist call for admission given worsening multifocal pneumonia. Attempted to get consult situation figured out but had to leave patient to oncoming team after waiting for call. This may be altered or completely changed at the discretion of the oncoming team pending results of further workup.     Final Clinical Impression(s) / ED Diagnoses Final diagnoses:  Multifocal pneumonia    Rx / DC Orders ED Discharge Orders     None         Smitty Knudsen, PA-C 10/20/22 1436    Gloris Manchester, MD 10/25/22 [phone]

## 2022-10-20 DIAGNOSIS — J189 Pneumonia, unspecified organism: Secondary | ICD-10-CM | POA: Diagnosis not present

## 2022-10-20 DIAGNOSIS — G4733 Obstructive sleep apnea (adult) (pediatric): Secondary | ICD-10-CM | POA: Diagnosis not present

## 2022-10-20 DIAGNOSIS — J9611 Chronic respiratory failure with hypoxia: Secondary | ICD-10-CM | POA: Diagnosis not present

## 2022-10-20 DIAGNOSIS — C3492 Malignant neoplasm of unspecified part of left bronchus or lung: Secondary | ICD-10-CM

## 2022-10-20 LAB — CBC
HCT: 50.5 % (ref 39.0–52.0)
Hemoglobin: 15.8 g/dL (ref 13.0–17.0)
MCH: 31.5 pg (ref 26.0–34.0)
MCHC: 31.3 g/dL (ref 30.0–36.0)
MCV: 100.6 fL — ABNORMAL HIGH (ref 80.0–100.0)
Platelets: 263 10*3/uL (ref 150–400)
RBC: 5.02 MIL/uL (ref 4.22–5.81)
RDW: 15.2 % (ref 11.5–15.5)
WBC: 11.4 10*3/uL — ABNORMAL HIGH (ref 4.0–10.5)
nRBC: 0 % (ref 0.0–0.2)

## 2022-10-20 LAB — BASIC METABOLIC PANEL
Anion gap: 10 (ref 5–15)
BUN: 19 mg/dL (ref 8–23)
CO2: 26 mmol/L (ref 22–32)
Calcium: 8.9 mg/dL (ref 8.9–10.3)
Chloride: 100 mmol/L (ref 98–111)
Creatinine, Ser: 1.02 mg/dL (ref 0.61–1.24)
GFR, Estimated: >60 mL/min (ref >60)
Glucose, Bld: 170 mg/dL — ABNORMAL HIGH (ref 70–99)
Potassium: 4.3 mmol/L (ref 3.5–5.1)
Sodium: 136 mmol/L (ref 135–145)

## 2022-10-20 LAB — STREP PNEUMONIAE URINARY ANTIGEN: Strep Pneumo Urinary Antigen: NEGATIVE

## 2022-10-20 LAB — CREATININE, SERUM
Creatinine, Ser: 0.77 mg/dL (ref 0.61–1.24)
GFR, Estimated: >60 mL/min (ref >60)

## 2022-10-20 LAB — PROCALCITONIN: Procalcitonin: <0.1 ng/mL

## 2022-10-20 LAB — MAGNESIUM: Magnesium: 2.4 mg/dL (ref 1.7–2.4)

## 2022-10-20 LAB — T4, FREE: Free T4: 0.84 ng/dL (ref 0.61–1.12)

## 2022-10-20 LAB — TSH: TSH: 0.875 u[IU]/mL (ref 0.350–4.500)

## 2022-10-20 MED ORDER — OXYCODONE HCL 5 MG PO TABS
5.0000 mg | ORAL_TABLET | Freq: Four times a day (QID) | ORAL | Status: DC | PRN
  Administered 2022-10-21: 5 mg via ORAL
  Filled 2022-10-20: qty 1

## 2022-10-20 MED ORDER — GUAIFENESIN 100 MG/5ML PO LIQD: 5.0000 mL | ORAL | Status: DC | PRN

## 2022-10-20 MED ORDER — ACETAMINOPHEN 650 MG RE SUPP: 650.0000 mg | Freq: Four times a day (QID) | RECTAL | Status: DC | PRN

## 2022-10-20 MED ORDER — UMECLIDINIUM BROMIDE 62.5 MCG/ACT IN AEPB
1.0000 | INHALATION_SPRAY | Freq: Every day | RESPIRATORY_TRACT | Status: DC
  Filled 2022-10-20: qty 7

## 2022-10-20 MED ORDER — ACETAMINOPHEN 325 MG PO TABS
650.0000 mg | ORAL_TABLET | Freq: Four times a day (QID) | ORAL | Status: DC | PRN
  Administered 2022-10-20 – 2022-10-21 (×2): 650 mg via ORAL
  Filled 2022-10-20 (×2): qty 2

## 2022-10-20 MED ORDER — CYCLOBENZAPRINE HCL 10 MG PO TABS
10.0000 mg | ORAL_TABLET | Freq: Every day | ORAL | Status: DC
  Administered 2022-10-20: 10 mg via ORAL
  Filled 2022-10-20: qty 1

## 2022-10-20 MED ORDER — OXCARBAZEPINE 300 MG PO TABS
600.0000 mg | ORAL_TABLET | Freq: Three times a day (TID) | ORAL | Status: DC
  Administered 2022-10-20 – 2022-10-21 (×4): 600 mg via ORAL
  Filled 2022-10-20 (×6): qty 2

## 2022-10-20 MED ORDER — BUPROPION HCL ER (XL) 150 MG PO TB24
300.0000 mg | ORAL_TABLET | Freq: Every morning | ORAL | Status: DC
  Administered 2022-10-20 – 2022-10-21 (×2): 300 mg via ORAL
  Filled 2022-10-20 (×2): qty 2

## 2022-10-20 MED ORDER — ENOXAPARIN SODIUM 40 MG/0.4ML IJ SOSY
40.0000 mg | PREFILLED_SYRINGE | INTRAMUSCULAR | Status: DC
  Administered 2022-10-20 – 2022-10-21 (×2): 40 mg via SUBCUTANEOUS
  Filled 2022-10-20 (×2): qty 0.4

## 2022-10-20 MED ORDER — DULOXETINE HCL 60 MG PO CPEP
60.0000 mg | ORAL_CAPSULE | Freq: Two times a day (BID) | ORAL | Status: DC
  Administered 2022-10-20 – 2022-10-21 (×3): 60 mg via ORAL
  Filled 2022-10-20 (×2): qty 1
  Filled 2022-10-20: qty 2

## 2022-10-20 MED ORDER — SODIUM CHLORIDE 0.9 % IV SOLN: 1.0000 g | INTRAVENOUS | Status: DC

## 2022-10-20 MED ORDER — SENNOSIDES-DOCUSATE SODIUM 8.6-50 MG PO TABS: 1.0000 | ORAL_TABLET | Freq: Every evening | ORAL | Status: DC | PRN

## 2022-10-20 MED ORDER — TAMSULOSIN HCL 0.4 MG PO CAPS
0.8000 mg | ORAL_CAPSULE | Freq: Every day | ORAL | Status: DC
  Administered 2022-10-20 – 2022-10-21 (×2): 0.8 mg via ORAL
  Filled 2022-10-20 (×2): qty 2

## 2022-10-20 MED ORDER — MELATONIN 3 MG PO TABS: 6.0000 mg | ORAL_TABLET | Freq: Every evening | ORAL | Status: DC | PRN

## 2022-10-20 MED ORDER — LURASIDONE HCL 40 MG PO TABS
40.0000 mg | ORAL_TABLET | Freq: Every evening | ORAL | Status: DC
  Administered 2022-10-20: 40 mg via ORAL
  Filled 2022-10-20 (×3): qty 1

## 2022-10-20 MED ORDER — GABAPENTIN 100 MG PO CAPS
300.0000 mg | ORAL_CAPSULE | Freq: Three times a day (TID) | ORAL | Status: DC
  Administered 2022-10-20 – 2022-10-21 (×4): 300 mg via ORAL
  Filled 2022-10-20 (×2): qty 3
  Filled 2022-10-20: qty 1
  Filled 2022-10-20: qty 3

## 2022-10-20 MED ORDER — FOLIC ACID 1 MG PO TABS
1.0000 mg | ORAL_TABLET | Freq: Every day | ORAL | Status: DC
  Administered 2022-10-20 – 2022-10-21 (×2): 1 mg via ORAL
  Filled 2022-10-20 (×2): qty 1

## 2022-10-20 MED ORDER — DILTIAZEM HCL ER COATED BEADS 180 MG PO CP24
180.0000 mg | ORAL_CAPSULE | Freq: Every day | ORAL | Status: DC
  Administered 2022-10-20 – 2022-10-21 (×2): 180 mg via ORAL
  Filled 2022-10-20 (×2): qty 1

## 2022-10-20 MED ORDER — SODIUM CHLORIDE 0.9 % IV SOLN: 500.0000 mg | INTRAVENOUS | Status: DC

## 2022-10-20 MED ORDER — ARFORMOTEROL TARTRATE 15 MCG/2ML IN NEBU
15.0000 ug | INHALATION_SOLUTION | Freq: Two times a day (BID) | RESPIRATORY_TRACT | Status: DC
  Administered 2022-10-20 – 2022-10-21 (×2): 15 ug via RESPIRATORY_TRACT
  Filled 2022-10-20 (×3): qty 2

## 2022-10-20 MED ORDER — BENZONATATE 100 MG PO CAPS
100.0000 mg | ORAL_CAPSULE | Freq: Three times a day (TID) | ORAL | Status: DC
  Administered 2022-10-20 – 2022-10-21 (×4): 100 mg via ORAL
  Filled 2022-10-20 (×4): qty 1

## 2022-10-20 MED ORDER — LISINOPRIL 10 MG PO TABS
20.0000 mg | ORAL_TABLET | Freq: Every day | ORAL | Status: DC
  Administered 2022-10-20: 20 mg via ORAL
  Filled 2022-10-20: qty 2

## 2022-10-20 MED ORDER — ALBUTEROL SULFATE (2.5 MG/3ML) 0.083% IN NEBU: 2.5000 mg | INHALATION_SOLUTION | RESPIRATORY_TRACT | Status: DC | PRN

## 2022-10-20 MED ORDER — LEVOFLOXACIN IN D5W 750 MG/150ML IV SOLN: 750.0000 mg | INTRAVENOUS | Status: DC

## 2022-10-20 MED ORDER — TRAZODONE HCL 50 MG PO TABS
75.0000 mg | ORAL_TABLET | Freq: Every day | ORAL | Status: DC
  Administered 2022-10-20: 75 mg via ORAL
  Filled 2022-10-20: qty 2

## 2022-10-20 MED ORDER — BUSPIRONE HCL 5 MG PO TABS
15.0000 mg | ORAL_TABLET | Freq: Every day | ORAL | Status: DC
  Administered 2022-10-20 – 2022-10-21 (×2): 15 mg via ORAL
  Filled 2022-10-20: qty 3
  Filled 2022-10-20: qty 2

## 2022-10-20 NOTE — Progress Notes (Signed)
[facility] PLAN OF CARE NOTE Patient: Mario [name] BJY:[id number]   [facility] DOB: 01/11/1951   DOA: 10/19/2022   DOS: 10/20/2022    Patient was admitted by my colleague earlier on 10/20/2022. I have reviewed the H&P as well as assessment and plan and agree with the same. Important changes in the plan are listed below.  Plan of care: Principal Problem:   Multifocal pneumonia Active Problems:   Community acquired pneumonia   DM2 (diabetes mellitus, type 2) (HCC)   Hypertension   Major depressive disorder, recurrent episode (HCC)   OSA (obstructive sleep apnea)   Tobacco abuse   Non-small cell lung cancer, left (HCC) Recently hospitalized between 7/28 - 8/3 for acute on chronic hypoxia secondary to COVID-19 pneumonia. Visited ED on 8/9 - 8/11 for major depression. Present to the hospital with diaphoresis. Has ongoing cough since his August hospitalization. Procalcitonin level is negative. CT does show evidence of unchanged bilateral pneumonia. At present I do not think the patient is suffering from active bacterial pneumonia nor do I think that the patient is actually suffering from a new viral illness/inflammation given his oxygen requirement is about the same. Will discontinue antibiotic and observe.  TSH and free T4 were checked which appears to be within normal range.  Patient ran out of some of his home medication.  Potential withdrawal causing diaphoresis cannot be ruled out.  At present I do not think the patient is suffering from COPD exacerbation and thus does not require any steroid.  Patient does have history of adenocarcinoma of the lung. I am unable to gather any data from [facility] or [facility] with regards to treatment. Suspect his persistent pneumonia is related to his adenocarcinoma. I am unsure whether he has any follow-up PET scan. Patient has seen Dr. [name] here but has not followed up since last 1 year. Will confirm with the  patient and recommend appropriate follow-up.  Level of care: Med-Surg  Author: Lynden Oxford, MD  [facility] 10/20/2022 4:08 PM   If 7PM-7AM, please contact night-coverage at [email or web address]

## 2022-10-20 NOTE — H&P (Signed)
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  Patient was recently admitted 7/28 - 8/3 with pneumonia, started IV antibiotics.  Antibiotics discontinued after patient diagnosed with COVID-pneumonia.  He was treated with a course of remdesivir and Decadron.  Per patient since discharge he has not really felt well.  He has been lethargic, weak with decreased appetite.  Yesterday per patient it all came to ahead.  He felt awful and was sweating all day.  He was lethargic, listless.  He endorses chills but denies fevers.  He has had a persistent cough for the past 3 weeks.  He denies fevers or ill contacts.  Called the [place] nurse, she recommended go to ER. In addition patient has a history of lung cancer.  Treated with radiation therapy.  He has never been on chemotherapy or immunotherapy d/t his comorbidities.  This is being monitored by his [place] oncologist with no recent spread.  In the ER patient's stable at 4 L baseline home oxygen.  Vital stable, afebrile.  WBC 13.9, glucose 144.   CT chest shows Bilateral lower lobe airspace consolidation concerning for multifocal pneumonia. This is increased in the right lower lobe in the interval.  Review of Systems:  Per HPI  Past Medical History: Past Medical History:  Diagnosis Date   Ankylosing spondylitis lumbar region (HCC) 09/25/2022   Anxiety    Bronchitis    COPD (chronic obstructive pulmonary disease) (HCC)    Depression    History of radiation therapy    Left lung- 10/05/20-10/15/20- Dr. Antony Blackbird   Hypertension    lung ca 09/2020   MI (myocardial infarction) [facility])    ????   On home oxygen therapy    4L/min [place]   OSA (obstructive sleep apnea)    Suicide attempt (HCC)    Tension  pneumothorax 06/27/2016   Uveitis    Past Surgical History:  Procedure Laterality Date   BIOPSY  07/03/2021   Procedure: BIOPSY;  Surgeon: Kathi Der, MD;  Location: [facility];  Service: Gastroenterology;;   BRONCHIAL BIOPSY  07/30/2020   Procedure: BRONCHIAL BIOPSIES;  Surgeon: Josephine Igo, DO;  Location: [facility];  Service: Pulmonary;;   BRONCHIAL BRUSHINGS  07/30/2020   Procedure: BRONCHIAL BRUSHINGS;  Surgeon: Josephine Igo, DO;  Location: [facility];  Service: Pulmonary;;   BRONCHIAL NEEDLE ASPIRATION BIOPSY  07/30/2020   Procedure: BRONCHIAL NEEDLE ASPIRATION BIOPSIES;  Surgeon: Josephine Igo, DO;  Location: [facility];  Service: Pulmonary;;   BRONCHIAL WASHINGS  07/30/2020   Procedure: BRONCHIAL WASHINGS;  Surgeon: Josephine Igo, DO;  Location: [facility];  Service: Pulmonary;;   CHEST TUBE INSERTION Left 06/27/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     ESOPHAGOGASTRODUODENOSCOPY N/A 07/03/2021   Procedure: ESOPHAGOGASTRODUODENOSCOPY (EGD);  Surgeon: Kathi Der, MD;  Location: [facility];  Service: Gastroenterology;  Laterality: N/A;   IR PERC PLEURAL DRAIN W/INDWELL CATH W/IMG GUIDE  08/04/2021   IR REMOVAL OF PLURAL CATH W/CUFF  09/01/2021   SKIN CANCER EXCISION     VIDEO BRONCHOSCOPY WITH ENDOBRONCHIAL NAVIGATION Left 07/30/2020   Procedure: VIDEO BRONCHOSCOPY WITH ENDOBRONCHIAL NAVIGATION;  Surgeon: Josephine Igo, DO;  Location: [facility];  Service: Pulmonary;  Laterality: Left;    Medications: Prior to Admission medications  Medication Sig Start Date End Date Taking? Authorizing Provider  albuterol (PROVENTIL) (2.5 MG/3ML) 0.083% nebulizer solution Inhale 3 mLs (2.5 mg total) by nebulization every 4 (four) hours as needed for wheezing or shortness of breath. 08/04/20 10/08/22  Rolly Salter, MD  albuterol (VENTOLIN HFA) 108 (90 Base) MCG/ACT inhaler Inhale 2 puffs into the lungs every 4 (four) hours as needed for wheezing or shortness of breath. 11/04/21   Glade Lloyd, MD  azithromycin (ZITHROMAX) 250 MG tablet Take 1 tablet (250 mg total) by mouth daily. Take first 2 tablets together, then 1 every day until finished. 10/16/22   Wynetta Fines, MD  buPROPion (WELLBUTRIN XL) 300 MG 24 hr tablet Take 300 mg by mouth every morning. 02/05/20   [provider]  busPIRone (BUSPAR) 15 MG tablet Take 15 mg by mouth daily.    [provider]  Carboxymethylcellulose Sod PF 0.5 % SOLN Place 1 drop into both eyes 4 (four) times daily as needed (dry eyes).    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    [provider]  cyclobenzaprine (FLEXERIL) 10 MG tablet Take 10 mg by mouth at bedtime.    [provider]  cycloSPORINE (RESTASIS) 0.05 % ophthalmic emulsion Place 1 drop into both eyes every 12 (twelve) hours.    [provider]  dexamethasone (DECADRON) 4 MG tablet Take 6 mg by mouth 2 (two) times daily. 10/01/22   [provider]  diltiazem (CARDIZEM CD) 180 MG 24 hr capsule Take 180 mg by mouth daily.    [provider]  DULoxetine (CYMBALTA) 60 MG capsule Take 1 capsule (60 mg total) by mouth 2 (two) times daily. 10/04/21 10/08/22  Earney Navy, NP  fluticasone (FLONASE) 50 MCG/ACT nasal spray Place 2 sprays into both nostrils daily.     [provider]  folic acid (FOLVITE) 1 MG tablet Take 1 mg by mouth daily.    [provider]  gabapentin (NEURONTIN) 300 MG capsule Take 1 capsule (300 mg total) by mouth 3 (three) times daily for 10 days. 09/02/22 10/08/22  Glyn Ade, MD  guaiFENesin (MUCINEX) 600 MG 12 hr tablet Take 600 mg by mouth 2 (two) times daily as needed for cough.    [provider]  lidocaine (HM LIDOCAINE PATCH) 4 % Place 1 patch onto the skin daily. 09/02/22   Glyn Ade, MD  lisinopril (ZESTRIL) 20 MG tablet Take 20 mg by mouth  daily.    [provider]  lurasidone (LATUDA) 40 MG TABS tablet Take 40 mg by mouth every evening. After supper    [provider]  Melatonin 3 MG CAPS Take 6 mg by mouth at bedtime as needed (sleep).    [provider]  metFORMIN (GLUCOPHAGE) 850 MG tablet Take 850 mg by mouth daily with breakfast.    [provider]  mycophenolate (CELLCEPT) 250 MG capsule Take 1,500 mg by mouth 2 (two) times daily.    [provider]  Nutritional Supplements (ENSURE ORIGINAL) LIQD Take 1 Bottle by mouth daily as needed (in between meals).    [provider]  ondansetron (ZOFRAN-ODT) 4 MG disintegrating tablet Take 1 tablet (4 mg total) by mouth every 8 (eight) hours as needed for nausea or vomiting. 10/04/21   Earney Navy, NP  oxcarbazepine (TRILEPTAL) 600 MG tablet Take 600 mg by mouth See admin instructions.  Take 1 tablet BID and at bedtime    [provider]  oxyCODONE (OXY IR/ROXICODONE) 5 MG immediate release tablet Take 1-2 tablets (5-10 mg total) by mouth every 4 (four) hours as needed for severe pain. 09/10/22   Smitty Knudsen, PA-C  polyethylene glycol powder (GLYCOLAX/MIRALAX) 17 GM/SCOOP powder Take 17 g by mouth daily.    [provider]  prednisoLONE acetate (PRED FORTE) 1 % ophthalmic suspension Place 1 drop into both eyes See admin instructions. 1 drop every hour while awake    [provider]  predniSONE (DELTASONE) 10 MG tablet Take 4 tablets (40 mg total) by mouth daily. 10/16/22   Wynetta Fines, MD  tamsulosin (FLOMAX) 0.4 MG CAPS capsule Take 0.8 mg by mouth daily.    [provider]  Tiotropium Bromide-Olodaterol 2.5-2.5 MCG/ACT AERS Inhale 2 each into the lungs every morning. 2 puffs    [provider]  traZODone (DESYREL) 50 MG tablet Take 75 mg by mouth at bedtime.    [provider]    Allergies:   Allergies  Allergen Reactions   Demerol [Meperidine] Nausea And Vomiting  and Other (See Comments)    Made the patient "violently sick"   Zocor [Simvastatin] Nausea And Vomiting and Other (See Comments)    Made him very jittery, also   Beet [Beta Vulgaris] Nausea And Vomiting   Collard Greens [Wild Lettuce Extract (Lactuca Virosa)] Other (See Comments)    Throw up   Liver Nausea And Vomiting    Social History:  reports that he quit smoking about 6 years ago. His smoking use included cigarettes. He started smoking about 41 years ago. He has a 35 pack-year smoking history. He has been exposed to tobacco smoke. He has never used smokeless tobacco. He reports that he does not drink alcohol and does not use drugs.  Family History: Family History  Problem Relation Age of Onset   Dementia Father     Physical Exam: Vitals:   10/19/22 2115 10/19/22 2200 10/19/22 2300 10/20/22 0000  BP: 133/69 (!) 142/91  125/73  Pulse: 86 84  90  Resp: 16 16  18   Temp:   98.5 F (36.9 C)   TempSrc:   Oral   SpO2: 94% 96%  93%  Weight:      Height:        General: A & O x 3, well-nourished, looks older than stated age, no distress Eyes: Pink conjunctiva, no scleral icterus ENT: Moist oral mucosa, neck supple, no thyromegaly Lungs: Dry crackles LLL, decreased breath sounds in all the lobes.  No use of accessory muscles Cardiovascular: RRR, no regurgitation, no gallops, no murmurs. 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 Psych: appropriate patient  Labs on Admission:  Recent Labs    10/19/22 1854  NA 134*  K 4.9  CL 98  CO2 24  GLUCOSE 144*  BUN 22  CREATININE 1.01  CALCIUM 9.2   Recent Labs    10/19/22 1854  AST 17  ALT 19  ALKPHOS 92  BILITOT 0.4  PROT 7.5  ALBUMIN 3.7    Recent Labs    10/19/22 1854  WBC 13.9*  NEUTROABS 11.8*  HGB  15.7  HCT 48.4  MCV 95.1  PLT 380    Recent Labs    10/19/22  1854  DDIMER 0.33     Micro Results: Recent Results (from the past 240 hour(s))  SARS Coronavirus 2 by RT PCR (hospital order, performed in [facility] hospital lab) *cepheid single result test* Anterior Nasal Swab     Status: None   Collection Time: 10/19/22 10:56 PM   Specimen: Anterior Nasal Swab  Result Value Ref Range Status   SARS Coronavirus 2 by RT PCR NEGATIVE NEGATIVE Final    Comment: (NOTE) SARS-CoV-2 target nucleic acids are NOT DETECTED.  The SARS-CoV-2 RNA is generally detectable in upper and lower respiratory specimens during the acute phase of infection. The lowest concentration of SARS-CoV-2 viral copies this assay can detect is 250 copies / mL. A negative result does not preclude SARS-CoV-2 infection and should not be used as the sole basis for treatment or other patient management decisions.  A negative result may occur with improper specimen collection / handling, submission of specimen other than nasopharyngeal swab, presence of viral mutation(s) within the areas targeted by this assay, and inadequate number of viral copies (<250 copies / mL). A negative result must be combined with clinical observations, patient history, and epidemiological information.  Fact Sheet for Patients:   RoadLapTop.co.za  Fact Sheet for Healthcare Providers: http://kim-miller.com/  This test is not yet approved or  cleared by the [place] FDA and has been authorized for detection and/or diagnosis of SARS-CoV-2 by FDA under an Emergency Use Authorization (EUA).  This EUA will remain in effect (meaning this test can be used) for the duration of the COVID-19 declaration under Section 564(b)(1) of the Act, 21 U.S.C. section 360bbb-3(b)(1), unless the authorization is terminated or revoked sooner.  Performed at [facility], [address] [place], [place] [address]      Radiological Exams on Admission: CT Chest W Contrast  Result Date: 10/19/2022 CLINICAL DATA:  Respiratory illness. EXAM: CT CHEST WITH CONTRAST TECHNIQUE: Multidetector CT imaging of the chest was performed during intravenous contrast administration. RADIATION DOSE REDUCTION: This exam was performed according to the departmental dose-optimization program which includes automated exposure control, adjustment of the mA and/or kV according to patient size and/or use of iterative reconstruction technique. CONTRAST:  75mL OMNIPAQUE IOHEXOL 300 MG/ML  SOLN COMPARISON:  Chest x-ray same day.  CT angiogram chest 10/08/2022. FINDINGS: Cardiovascular: No significant vascular findings. Normal heart size. No pericardial effusion. There is mild calcified atherosclerotic disease throughout the aorta and within the coronary arteries. Mediastinum/Nodes: No enlarged mediastinal, hilar, or axillary lymph nodes. Thyroid gland, trachea, and esophagus demonstrate no significant findings. Lungs/Pleura: Moderate emphysematous changes are present. Airspace consolidation is again seen in the bilateral lower lobes, left greater than right. This is increased in the right lower lobe. There is a small amount of airspace disease in the lingula which is unchanged. There is no pleural effusion or pneumothorax. Trachea and central airways are patent. Upper Abdomen: 2.4 cm right renal cyst is again noted. There is also small cyst in the caudate lobe of the liver measuring 9 mm, unchanged. Musculoskeletal: Lateral left sixth and seventh rib fractures are mildly displaced and appear unchanged/subacute. Multilevel degenerative changes affect the spine. Bridging enthesophytes are seen in the cervicothoracic spine, unchanged. IMPRESSION: 1. Bilateral lower lobe airspace consolidation concerning for multifocal pneumonia. This is increased in the right lower lobe in the interval. Follow-up imaging recommended to  confirm complete resolution. 2. Stable airspace disease in the lingula. 3. Stable subacute left sixth and seventh rib fractures. 4. Aortic atherosclerosis. Aortic Atherosclerosis (  ICD10-I70.0). Electronically Signed   By: Darliss Cheney M.D.   On: 10/19/2022 21:33   DG Chest Port 1 View  Result Date: 10/19/2022 CLINICAL DATA:  Diaphoresis. EXAM: PORTABLE CHEST 1 VIEW COMPARISON:  October 16, 2022 FINDINGS: The heart size and mediastinal contours are within normal limits. There is evidence of emphysematous lung disease with persistent moderate severity areas of bibasilar atelectasis and/or infiltrate. This is mildly increased in severity within the right lung base when compared to the prior study. Small, stable bilateral pleural effusions are seen. No pneumothorax is identified. Mildly displaced seventh and eighth left-sided rib fractures are noted. IMPRESSION: 1. Emphysematous lung disease with moderate severity bibasilar atelectasis and/or infiltrate. 2. Small, stable bilateral pleural effusions. 3. Mildly displaced seventh and eighth left-sided rib fractures. Electronically Signed   By: Aram Candela M.D.   On: 10/19/2022 19:17    Assessment/Plan Present on Admission:  Multifocal pneumonia //chronic respiratory failure -Pneumonia order set initiated -Rocephin and azithromycin started in ER, continued -Blood cultures x 2 collected -Continue oxygen, see sats greater than 88%. -Nebulizers as needed -Tessalon Perles scheduled and as needed ordered   COPD -Nebulizers as needed ordered   Tachycardia -On Cardizem for tachycardia, resumed.     Hypertension -Lisinopril,   Major depressive disorder, recurrent episode (HCC) -Bupropion, BuSpar, Latuda resumed   Non-small cell lung cancer, left (HCC) -Follows w/ Oncology. Stage 4   OSA (obstructive sleep apnea) -CPAP resumed  BPH -Flomax resumed  Chronic pain -Oxy IR resumed  Rib fractures -old  Javious Hallisey 10/20/2022, 1:30 AM

## 2022-10-20 NOTE — ED Notes (Signed)
Refuses to wear cardiac leads

## 2022-10-20 NOTE — ED Provider Notes (Signed)
  Physical Exam  BP (!) 141/92   Pulse 83   Temp 98.5 F (36.9 C) (Oral)   Resp 16   Ht 6\' 2"  (1.88 m)   Wt 136.1 kg   SpO2 96%   BMI 38.52 kg/m   Physical Exam  Procedures  Procedures  ED Course / MDM   Clinical Course as of 10/20/22 0303  Wed Oct 19, 2022  2233 DG Chest [place] 1 View [OZ]    Clinical Course User Index [OZ] Smitty Knudsen, PA-C   Medical Decision Making Amount and/or Complexity of Data Reviewed Labs: ordered. Radiology: ordered. Decision-making details documented in ED Course. ECG/medicine tests: ordered.  Risk OTC drugs. Prescription drug management. Decision regarding hospitalization.   Patient care assumed at shift handoff.  Please see previous providers note for full details.  Patient with multifocal pneumonia, presented complaining of diaphoresis and worsening shortness of breath.  Plan to discuss case with hospitalist for admission for multifocal pneumonia treatment.  I discussed the case with the hospitalist who agreed to see the patient for admission.       Pamala Duffel 10/20/22 Aniceto Boss    Tilden Fossa, MD 10/20/22 2250

## 2022-10-20 NOTE — Progress Notes (Signed)
   10/20/22 1952  BiPAP/CPAP/SIPAP  Reason BIPAP/CPAP not in use Non-compliant   Pt refused cpap tonight.  Pt encouraged to call should he change his mind.

## 2022-10-21 ENCOUNTER — Encounter (HOSPITAL_COMMUNITY): Payer: Self-pay | Admitting: Family Medicine

## 2022-10-21 DIAGNOSIS — J189 Pneumonia, unspecified organism: Secondary | ICD-10-CM | POA: Diagnosis not present

## 2022-10-21 LAB — BLOOD CULTURE ID PANEL (REFLEXED) - BCID2

## 2022-10-21 LAB — BASIC METABOLIC PANEL
Anion gap: 9 (ref 5–15)
BUN: 23 mg/dL (ref 8–23)
CO2: 24 mmol/L (ref 22–32)
Calcium: 8.1 mg/dL — ABNORMAL LOW (ref 8.9–10.3)
Chloride: 103 mmol/L (ref 98–111)
Creatinine, Ser: 0.95 mg/dL (ref 0.61–1.24)
GFR, Estimated: >60 mL/min (ref >60)
Glucose, Bld: 164 mg/dL — ABNORMAL HIGH (ref 70–99)
Potassium: 4.1 mmol/L (ref 3.5–5.1)
Sodium: 136 mmol/L (ref 135–145)

## 2022-10-21 LAB — CBC WITH DIFFERENTIAL/PLATELET
Abs Immature Granulocytes: 0.14 10*3/uL — ABNORMAL HIGH (ref 0.00–0.07)
Basophils Absolute: 0.1 10*3/uL (ref 0.0–0.1)
Basophils Relative: 1 %
Eosinophils Absolute: 0.8 10*3/uL — ABNORMAL HIGH (ref 0.0–0.5)
Eosinophils Relative: 6 %
HCT: 44.7 % (ref 39.0–52.0)
Hemoglobin: 14.3 g/dL (ref 13.0–17.0)
Immature Granulocytes: 1 %
Lymphocytes Relative: 18 %
Lymphs Abs: 2.2 10*3/uL (ref 0.7–4.0)
MCH: 31 pg (ref 26.0–34.0)
MCHC: 32 g/dL (ref 30.0–36.0)
MCV: 96.8 fL (ref 80.0–100.0)
Monocytes Absolute: 1.3 10*3/uL — ABNORMAL HIGH (ref 0.1–1.0)
Monocytes Relative: 10 %
Neutro Abs: 8 10*3/uL — ABNORMAL HIGH (ref 1.7–7.7)
Neutrophils Relative %: 64 %
Platelets: 285 10*3/uL (ref 150–400)
RBC: 4.62 MIL/uL (ref 4.22–5.81)
RDW: 15.3 % (ref 11.5–15.5)
WBC: 12.4 10*3/uL — ABNORMAL HIGH (ref 4.0–10.5)
nRBC: 0 % (ref 0.0–0.2)

## 2022-10-21 MED ORDER — MYCOPHENOLATE MOFETIL 250 MG PO CAPS
1500.0000 mg | ORAL_CAPSULE | Freq: Two times a day (BID) | ORAL | Status: DC
  Filled 2022-10-21 (×2): qty 6

## 2022-10-21 MED ORDER — CYCLOSPORINE 0.05 % OP EMUL
1.0000 [drp] | Freq: Two times a day (BID) | OPHTHALMIC | Status: DC
  Administered 2022-10-21: 1 [drp] via OPHTHALMIC
  Filled 2022-10-21 (×2): qty 30

## 2022-10-21 NOTE — Progress Notes (Signed)
   10/21/22 1013  TOC Brief Assessment  Insurance and Status Reviewed  Patient has primary care physician Yes  Home environment has been reviewed Resides in single family home  Prior level of function: Independent at baseline  Prior/Current Home Services Current home services (Patient is on 4L/min home oxygen through the [place].)  Social Determinants of Health Reivew SDOH reviewed no interventions necessary  Readmission risk has been reviewed Yes  Transition of care needs no transition of care needs at this time

## 2022-10-21 NOTE — TOC Transition Note (Signed)
Transition of Care [facility]) - CM/SW Discharge Note  Patient Details  Name: Mario [name] MRN: [id number] Date of Birth: [birth year]  Transition of Care [facility]) CM/SW Contact:  Ewing Schlein, LCSW Phone Number: 10/21/2022, 3:19 PM  Clinical Narrative: CSW notified by Merry Proud with [facility] patient is active for PT, OT, and social work. Hospitalist placed new orders. TOC notified patient does not have a ride home or an oxygen tank. CSW attempted to call [facility] [phone]), the DME company contracted with [facility], and spoke with Crystal. Per Crystal, even though the patient is already active with the company they cannot deliver a travel tank without getting orders from the [place] first. CSW attempted to call the [facility] oxygen line [phone]. [phone]), but was unable to reach anyone. CSW left VM, but did not receive a return call.  CSW explained to patient that [facility] will have to send the patient home via PTAR since an oxygen tank from [facility] will not be delivered today if the patient cannot find anyone to bring him his travel tank from home. Patient reported he did not want to go home via PTAR as he gets a $400 bill each time. Patient called his roommate to have his tank delivered to his room, but since his roommate is his usual ride and he works 2nd shift he will need another ride home since he "wasn't told this morning" that he would be discharging today so he could make plans for a ride. Patient requested [facility] to avoid a bill from [place]. Patient signed rider waiver, which was placed on patient's chart. [facility] called. TOC signing off.  Final next level of care: Home w Home Health Services Barriers to Discharge: Barriers Resolved  Patient Goals and CMS Choice CMS Medicare.gov Compare Post Acute Care list provided to:: Patient Choice offered to / list presented to : Patient  Discharge Plan and Services Additional resources added to the After Visit Summary  for         DME Arranged: N/A DME Agency: NA HH Arranged: PT, OT, Social Work [employer] Agency: [facility] Date HH Agency Contacted: 10/21/22 Time HH Agency Contacted: 1426 Representative spoke with at [facility] Agency: Merry Proud  Social Determinants of Health (SDOH) Interventions SDOH Screenings   Food Insecurity: Patient Declined (10/20/2022)  Housing: Patient Declined (10/20/2022)  Transportation Needs: Patient Declined (10/20/2022)  Utilities: Patient Declined (10/20/2022)  Financial Resource Strain: Low Risk  (09/23/2020)  Social Connections: Unknown (09/09/2022)   Received from [facility]  Stress: No Stress Concern Present (09/23/2020)  Tobacco Use: Medium Risk (10/21/2022)   Readmission Risk Interventions    11/03/2021   11:50 AM 08/23/2021   11:48 AM 08/05/2021   11:02 AM  Readmission Risk Prevention Plan  Transportation Screening Complete Complete Complete  Medication Review Ocean[facility]) Complete Complete Complete  PCP or Specialist appointment within 3-5 days of discharge Complete Complete Complete  HRI or Home Care Consult Complete Complete Complete  SW Recovery Care/Counseling Consult Complete Complete Complete  Palliative Care Screening Not Applicable Not Applicable Not Applicable  Skilled Nursing Facility Not Applicable Not Applicable Not Applicable

## 2022-10-21 NOTE — Progress Notes (Signed)
Patient discharged home via safe transport, IV removed, discharge paperwork explained to patient and patient verbalized understanding, awaiting safe transport to arrive.

## 2022-10-21 NOTE — Progress Notes (Signed)
PHARMACY - PHYSICIAN COMMUNICATION CRITICAL VALUE ALERT - BLOOD CULTURE IDENTIFICATION (BCID)  Mario [name] is an 72 y.o. male who presented to [facility] on 10/19/2022 with a chief complaint of shortness of breath. Principal problem determined to be multifocal pneumonia in the setting of acute on chronic hypoxia secondary to COVID-19 pneumonia with history of COPD & adenocarcinoma of the lung.  Assessment: 8/22 BCx: 1 out of 4 staph species positive for gram positive cocci in cluster; BCID detected staphylococcus species.  Name of physician (or Provider) ContactedVirgel Manifold  Current antibiotics: none  Changes to prescribed antibiotics recommended:  Currently not on antimicrobial therapy.  Contamination likely; no changes made.  Results for orders placed or performed during the hospital encounter of 10/19/22  Blood Culture ID Panel (Reflexed) (Collected: 10/20/2022  2:10 AM)  Result Value Ref Range   Enterococcus faecalis NOT DETECTED NOT DETECTED   Enterococcus Faecium NOT DETECTED NOT DETECTED   Listeria monocytogenes NOT DETECTED NOT DETECTED   Staphylococcus species DETECTED (A) NOT DETECTED   Staphylococcus aureus (BCID) NOT DETECTED NOT DETECTED   Staphylococcus epidermidis NOT DETECTED NOT DETECTED   Staphylococcus lugdunensis NOT DETECTED NOT DETECTED   Streptococcus species NOT DETECTED NOT DETECTED   Streptococcus agalactiae NOT DETECTED NOT DETECTED   Streptococcus pneumoniae NOT DETECTED NOT DETECTED   Streptococcus pyogenes NOT DETECTED NOT DETECTED   A.calcoaceticus-baumannii NOT DETECTED NOT DETECTED   Bacteroides fragilis NOT DETECTED NOT DETECTED   Enterobacterales NOT DETECTED NOT DETECTED   Enterobacter cloacae complex NOT DETECTED NOT DETECTED   Escherichia coli NOT DETECTED NOT DETECTED   Klebsiella aerogenes NOT DETECTED NOT DETECTED   Klebsiella oxytoca NOT DETECTED NOT DETECTED   Klebsiella pneumoniae NOT DETECTED NOT DETECTED   Proteus species NOT  DETECTED NOT DETECTED   Salmonella species NOT DETECTED NOT DETECTED   Serratia marcescens NOT DETECTED NOT DETECTED   Haemophilus influenzae NOT DETECTED NOT DETECTED   Neisseria meningitidis NOT DETECTED NOT DETECTED   Pseudomonas aeruginosa NOT DETECTED NOT DETECTED   Stenotrophomonas maltophilia NOT DETECTED NOT DETECTED   Candida albicans NOT DETECTED NOT DETECTED   Candida auris NOT DETECTED NOT DETECTED   Candida glabrata NOT DETECTED NOT DETECTED   Candida krusei NOT DETECTED NOT DETECTED   Candida parapsilosis NOT DETECTED NOT DETECTED   Candida tropicalis NOT DETECTED NOT DETECTED   Cryptococcus neoformans/gattii NOT DETECTED NOT DETECTED    Lynden Ang, PharmD, BCPS 10/21/2022  7:02 AM

## 2022-10-21 NOTE — Evaluation (Signed)
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  Patient was recently admitted 7/28 - 8/3 with pneumonia, and was found to be covid +. Pt now readmitted 10/19/22 with weakness, chills, cough. Dx of PNA.  Clinical Impression  Pt is mobilizing well, he ambulated 140' without an assistive device, SpO2 92% on 4L O2 at rest, 88% on 4L O2 walking, 1 standing rest break 2* 3/4 dyspnea. Pt had no loss of balance with ambulation.  No further PT indicated, will sign off. Will have mobility team follow pt during hospitalization.       If plan is discharge home, recommend the following:     Can travel by private vehicle        Equipment Recommendations None recommended by PT  Recommendations for Other Services       Functional Status Assessment Patient has not had a recent decline in their functional status     Precautions / Restrictions Precautions Precautions: Other (comment) Precaution Comments: monitor O2 Restrictions Weight Bearing Restrictions: No      Mobility  Bed Mobility Overal bed mobility: Modified Independent                  Transfers Overall transfer level: Modified independent                      Ambulation/Gait Ambulation/Gait assistance: Independent Gait Distance (Feet): 140 Feet Assistive device: None Gait Pattern/deviations: WFL(Within Functional Limits) Gait velocity: WFL     General Gait Details: steady, no loss of balance, 1 standing rest break 2* SOB,  SpO2 88% on 4L O2 walking, 3/4 dyspnea, SpO2 92% on 4L at rest  Stairs            Wheelchair Mobility     Tilt Bed    Modified Rankin (Stroke Patients Only)        Balance Overall balance assessment: No apparent balance deficits (not formally assessed)                                           Pertinent Vitals/Pain Pain Assessment Pain Assessment: No/denies pain Pain Score: 0-No pain    Home Living Family/patient expects to be discharged to:: Private residence Living Arrangements: Non-relatives/Friends Available Help at Discharge: Friend(s) Type of Home: House Home Access: Level entry     Alternate Level Stairs-Number of Steps: 5 Home Layout: Two level Home Equipment: Cane - single point;Shower Counsellor (2 wheels);Rollator (4 wheels);Other (comment) Additional Comments: pt reports he rents a room in a home with a few other roommates; goes up/down 5 steps to kitchen/living room area. bedroom on 2nd level of 5 steps. wears 34L O2 at baseline. Pt has a pulse oximeter at home    Prior Function Prior Level of Function : Independent/Modified Independent             Mobility Comments: "I don't walk much". When he walks he does not use an AD. He reports 1 falls in past 6 months due to "tripping on shoes". ADLs Comments: uses [employer] for grocery delivery     Extremity/Trunk Assessment   Upper  Extremity Assessment Upper Extremity Assessment: Overall WFL for tasks assessed    Lower Extremity Assessment Lower Extremity Assessment: Overall WFL for tasks assessed;RLE deficits/detail;LLE deficits/detail RLE Deficits / Details: decr sensation to light touch B feet, pt reports this is baseline since having vasculitis RLE Sensation: decreased light touch LLE Deficits / Details: decr sensation to light touch B feet, pt reports this is baseline since having vasculitis LLE Sensation: decreased light touch    Cervical / Trunk Assessment Cervical / Trunk Assessment: Normal  Communication   Communication Communication: No apparent difficulties  Cognition Arousal: Alert Behavior During Therapy: WFL for tasks  assessed/performed Overall Cognitive Status: Within Functional Limits for tasks assessed                                          General Comments      Exercises     Assessment/Plan    PT Assessment Patient does not need any further PT services  PT Problem List         PT Treatment Interventions      PT Goals (Current goals can be found in the Care Plan section)  Acute Rehab PT Goals Patient Stated Goal: to start exercising PT Goal Formulation: All assessment and education complete, DC therapy    Frequency       Co-evaluation               AM-PAC PT "6 Clicks" Mobility  Outcome Measure Help needed turning from your back to your side while in a flat bed without using bedrails?: None Help needed moving from lying on your back to sitting on the side of a flat bed without using bedrails?: None Help needed moving to and from a bed to a chair (including a wheelchair)?: None Help needed standing up from a chair using your arms (e.g., wheelchair or bedside chair)?: None Help needed to walk in hospital room?: None Help needed climbing 3-5 steps with a railing? : None 6 Click Score: 24    End of Session Equipment Utilized During Treatment: Oxygen;Gait belt Activity Tolerance: Patient tolerated treatment well Patient left: in bed;with call bell/phone within reach Nurse Communication: Mobility status      Time: 1141-1200 PT Time Calculation (min) (ACUTE ONLY): 19 min   Charges:   PT Evaluation $PT Eval Low Complexity: 1 Low   PT General Charges $$ ACUTE PT VISIT: 1 Visit         Tamala Ser PT 10/21/2022  Acute Rehabilitation Services  Office [phone]

## 2022-10-23 LAB — LEGIONELLA PNEUMOPHILA SEROGP 1 UR AG: L. pneumophila Serogp 1 Ur Ag: NEGATIVE

## 2022-10-23 LAB — CULTURE, BLOOD (ROUTINE X 2)

## 2022-10-26 LAB — CULTURE, BLOOD (ROUTINE X 2): Culture: NO GROWTH

## 2022-10-26 NOTE — Discharge Summary (Signed)
Physician Discharge Summary   Patient: Mario [name] MRN: [id number] DOB: [birth year]  Admit date:     10/19/2022  Discharge date: 10/21/2022  Discharge Physician: Lynden Oxford  PCP: [name]  Recommendations at discharge:  Follow up with PCP and Oncology at [facility] as recommended   Follow-up Information     [facility] Schedule an appointment as soon as possible for a visit in 1 week(s).   Contact information: [address] [facility] [place] [address] [phone]         Oncology. Schedule an appointment as soon as possible for a visit in 1 week(s).   Why: for further work up of recurrent pneumonia in relation to his adenocarcinoma.               Discharge Diagnoses: Principal Problem:   Multifocal pneumonia Active Problems:   Community acquired pneumonia   DM2 (diabetes mellitus, type 2) (HCC)   Hypertension   Major depressive disorder, recurrent episode (HCC)   OSA (obstructive sleep apnea)   Tobacco abuse   Non-small cell lung cancer, left [facility])  Hospital Course: Multifocal pneumonia Recent covid  Recently hospitalized between 7/28 - 8/3 for acute on chronic hypoxia secondary to COVID-19 pneumonia. Visited ED on 8/9 - 8/11 for major depression. Present to the hospital with diaphoresis. Has ongoing cough since his August hospitalization. Procalcitonin level is negative. CT does show evidence of unchanged bilateral pneumonia. At present I do not think the patient is suffering from active bacterial pneumonia nor do I think that the patient is actually suffering from a new viral illness/inflammation given his oxygen requirement is about the same. Will discontinue antibiotic. Pt did well after that.  Concern is that his symptoms are related to malignancy.  Diaphoresis  TSH and free T4 were checked which appears to be within normal range. Patient ran out of some of his home medication.  Potential withdrawal causing  diaphoresis cannot be ruled out.   COPD  Chronic resp failure on 4 LPM At present I do not think the patient is suffering from COPD exacerbation and thus does not require any steroid. 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 I am unable to gather any data from patient or [facility] with regards to treatment. Per pt he has received radiation but has not received cheomo or immunotherapy.  Suspect his persistent pneumonia is related to his adenocarcinoma. I am unsure whether he has any follow-up PET scan. Patient has seen Dr. [name] here but has not followed up since last 1 year.since he transferred his care to [place].  [facility].   Obesity Class 2 Body mass index is 38.52 kg/m.  Placing the pt at higher risk of poor outcomes.  Abnormal blood culture Blood culture grew coag negative staph in 1 bottle. Mostly contamination. No change in plan for Antibiotics   Consultants:  none  Procedures performed:  none  DISCHARGE MEDICATION: Allergies as of 10/21/2022       Reactions   Demerol [meperidine] Nausea And Vomiting, Other (See Comments)   Made the patient "violently sick"   Zocor [simvastatin] Nausea And Vomiting, Other (See Comments)   Made him very jittery, also   Beet [beta Vulgaris] Nausea And Vomiting   Collard Greens [wild Lettuce Extract (lactuca Virosa)] Other (See Comments)   Throw up   Liver Nausea And Vomiting        Medication List  STOP taking these medications    lisinopril 20 MG tablet Commonly known as: ZESTRIL       TAKE these medications    albuterol (2.5 MG/3ML) 0.083% nebulizer solution Commonly known as: PROVENTIL Inhale 3 mLs (2.5 mg total) by nebulization every 4 (four) hours as needed for wheezing or shortness of breath.   albuterol 108 (90 Base) MCG/ACT inhaler Commonly known as: VENTOLIN HFA Inhale 2 puffs into the lungs every 4 (four) hours as needed for wheezing  or shortness of breath.   azithromycin 250 MG tablet Commonly known as: ZITHROMAX Take 1 tablet (250 mg total) by mouth daily. Take first 2 tablets together, then 1 every day until finished.   buPROPion 300 MG 24 hr tablet Commonly known as: WELLBUTRIN XL Take 300 mg by mouth every morning.   busPIRone 15 MG tablet Commonly known as: BUSPAR Take 15 mg by mouth daily.   Carboxymethylcellulose Sod PF 0.5 % Soln Place 1 drop into both eyes 4 (four) times daily as needed (dry eyes).   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   cyclobenzaprine 10 MG tablet Commonly known as: FLEXERIL Take 10 mg by mouth at bedtime.   cycloSPORINE 0.05 % ophthalmic emulsion Commonly known as: RESTASIS Place 1 drop into both eyes every 12 (twelve) hours.   diltiazem 180 MG 24 hr capsule Commonly known as: CARDIZEM CD Take 180 mg by mouth daily.   DULoxetine 60 MG capsule Commonly known as: CYMBALTA Take 1 capsule (60 mg total) by mouth 2 (two) times daily.   Ensure Original Liqd Take 1 Bottle by mouth daily as needed (in between meals).   fluticasone 50 MCG/ACT nasal spray Commonly known as: FLONASE Place 2 sprays into both nostrils daily.   folic acid 1 MG tablet Commonly known as: FOLVITE Take 1 mg by mouth daily.   gabapentin 100 MG capsule Commonly known as: NEURONTIN Take 300 mg by mouth 3 (three) times daily. What changed: Another medication with the same name was removed. Continue taking this medication, and follow the directions you see here.   lurasidone 40 MG Tabs tablet Commonly known as: LATUDA Take 40 mg by mouth every evening. After supper   Melatonin 3 MG Caps Take 6 mg by mouth at bedtime as needed (sleep).   metFORMIN 850 MG tablet Commonly known as: GLUCOPHAGE Take 850 mg by mouth daily with breakfast.   mycophenolate 250 MG capsule Commonly known as:  CELLCEPT Take 1,500 mg by mouth 2 (two) times daily.   ondansetron 4 MG disintegrating tablet Commonly known as: ZOFRAN-ODT Take 1 tablet (4 mg total) by mouth every 8 (eight) hours as needed for nausea or vomiting.   oxcarbazepine 600 MG tablet Commonly known as: TRILEPTAL Take 600 mg by mouth See admin instructions. Take 1 tablet BID and at bedtime   oxyCODONE 5 MG immediate release tablet Commonly known as: Oxy IR/ROXICODONE Take 1-2 tablets (5-10 mg total) by mouth every 4 (four) hours as needed for severe pain.   polyethylene glycol powder 17 GM/SCOOP powder Commonly known as: GLYCOLAX/MIRALAX Take 17 g by mouth daily.   prednisoLONE acetate 1 % ophthalmic suspension Commonly known as: PRED FORTE Place 1 drop into both eyes See admin instructions. 1 drop every hour while awake   predniSONE 10 MG tablet Commonly known as: DELTASONE Take 4 tablets (40 mg total) by mouth daily.   tamsulosin 0.4 MG  Caps capsule Commonly known as: FLOMAX Take 0.8 mg by mouth daily.   Tiotropium Bromide-Olodaterol 2.5-2.5 MCG/ACT Aers Inhale 2 each into the lungs every morning. 2 puffs   traZODone 50 MG tablet Commonly known as: DESYREL Take 75 mg by mouth at bedtime.       Disposition: Home Diet recommendation: Cardiac diet  Discharge Exam: Vitals:   10/20/22 2216 10/21/22 0625 10/21/22 1208 10/21/22 1303  BP:  136/75  107/60  Pulse: 96 94  (!) 104  Resp:  16  14  Temp:  98.1 F (36.7 C)  97.8 F (36.6 C)  TempSrc:  Oral  Oral  SpO2:  99% (!) 88% 94%  Weight:      Height:       General: Appear in no distress; no visible Abnormal Neck Mass Or lumps, Conjunctiva normal Cardiovascular: S1 and S2 Present, no Murmur, Respiratory: good respiratory effort, Bilateral Air entry present and CTA, no Crackles, no wheezes Abdomen: Bowel Sound present, Non tender  Extremities: trace Pedal edema Neurology: alert and oriented to time, place, and person  Filed Weights   10/19/22 1751   Weight: 136.1 kg   Condition at discharge: stable  The results of significant diagnostics from this hospitalization (including imaging, microbiology, ancillary and laboratory) are listed below for reference.   Imaging Studies: CT Chest W Contrast  Result Date: 10/19/2022 CLINICAL DATA:  Respiratory illness. EXAM: CT CHEST WITH CONTRAST TECHNIQUE: Multidetector CT imaging of the chest was performed during intravenous contrast administration. RADIATION DOSE REDUCTION: This exam was performed according to the departmental dose-optimization program which includes automated exposure control, adjustment of the mA and/or kV according to patient size and/or use of iterative reconstruction technique. CONTRAST:  75mL OMNIPAQUE IOHEXOL 300 MG/ML  SOLN COMPARISON:  Chest x-ray same day.  CT angiogram chest 10/08/2022. FINDINGS: Cardiovascular: No significant vascular findings. Normal heart size. No pericardial effusion. There is mild calcified atherosclerotic disease throughout the aorta and within the coronary arteries. Mediastinum/Nodes: No enlarged mediastinal, hilar, or axillary lymph nodes. Thyroid gland, trachea, and esophagus demonstrate no significant findings. Lungs/Pleura: Moderate emphysematous changes are present. Airspace consolidation is again seen in the bilateral lower lobes, left greater than right. This is increased in the right lower lobe. There is a small amount of airspace disease in the lingula which is unchanged. There is no pleural effusion or pneumothorax. Trachea and central airways are patent. Upper Abdomen: 2.4 cm right renal cyst is again noted. There is also small cyst in the caudate lobe of the liver measuring 9 mm, unchanged. Musculoskeletal: Lateral left sixth and seventh rib fractures are mildly displaced and appear unchanged/subacute. Multilevel degenerative changes affect the spine. Bridging enthesophytes are seen in the cervicothoracic spine, unchanged. IMPRESSION: 1. Bilateral  lower lobe airspace consolidation concerning for multifocal pneumonia. This is increased in the right lower lobe in the interval. Follow-up imaging recommended to confirm complete resolution. 2. Stable airspace disease in the lingula. 3. Stable subacute left sixth and seventh rib fractures. 4. Aortic atherosclerosis. Aortic Atherosclerosis (ICD10-I70.0). Electronically Signed   By: Darliss Cheney M.D.   On: 10/19/2022 21:33   DG Chest Port 1 View  Result Date: 10/19/2022 CLINICAL DATA:  Diaphoresis. EXAM: PORTABLE CHEST 1 VIEW COMPARISON:  October 16, 2022 FINDINGS: The heart size and mediastinal contours are within normal limits. There is evidence of emphysematous lung disease with persistent moderate severity areas of bibasilar atelectasis and/or infiltrate. This is mildly increased in severity within the right lung base when compared to the  prior study. Small, stable bilateral pleural effusions are seen. No pneumothorax is identified. Mildly displaced seventh and eighth left-sided rib fractures are noted. IMPRESSION: 1. Emphysematous lung disease with moderate severity bibasilar atelectasis and/or infiltrate. 2. Small, stable bilateral pleural effusions. 3. Mildly displaced seventh and eighth left-sided rib fractures. Electronically Signed   By: Aram Candela M.D.   On: 10/19/2022 19:17   DG Chest Port 1 View  Result Date: 10/16/2022 CLINICAL DATA:  Shortness of breath EXAM: PORTABLE CHEST 1 VIEW COMPARISON:  Chest x-ray dated October 05, 2022 FINDINGS: Visualized cardiomediastinal contours are unchanged. Unchanged left-greater-than-right lower lung consolidations. Small left pleural effusion. Possible new right pleural effusion. Emphysema. No evidence of pneumothorax. IMPRESSION: 1. Chronic left-greater-than-right lower lung consolidations, likely due to known primary lung adenocarcinoma. 2. Stable small left pleural effusion. 3. Possible new right pleural effusion. 4. Emphysema. Electronically Signed    By: Allegra Lai M.D.   On: 10/16/2022 19:53   CT Angio Chest PE W and/or Wo Contrast  Result Date: 10/08/2022 CLINICAL DATA:  COVID diagnosis 2 weeks ago, recently discharged from hospital but with steadily worsening chest pain since discharge. EXAM: CT ANGIOGRAPHY CHEST WITH CONTRAST TECHNIQUE: Multidetector CT imaging of the chest was performed using the standard protocol during bolus administration of intravenous contrast. Multiplanar CT image reconstructions and MIPs were obtained to evaluate the vascular anatomy. RADIATION DOSE REDUCTION: This exam was performed according to the departmental dose-optimization program which includes automated exposure control, adjustment of the mA and/or kV according to patient size and/or use of iterative reconstruction technique. CONTRAST:  75mL OMNIPAQUE IOHEXOL 350 MG/ML SOLN COMPARISON:  Portable chest today, portable chest 10/01/2022, portable chest 09/27/2022, and CTA chest 09/21/2022 as well as 07/02/2022. FINDINGS: Cardiovascular: The cardiac size is normal. There is no substantial pericardial effusion. There is patchy three-vessel coronary artery calcific plaque. There is scattered calcific atherosclerosis in the aorta and great vessels without aneurysm or dissection. No arterial or venous dilatation or arterial embolus is seen. Mediastinum/Nodes: There are scattered borderline prominent mediastinal and a few left hilar borderline prominent lymph nodes. No enlarged lymph nodes are seen. The thyroid gland, axillary spaces, thoracic trachea and thoracic esophagus are unremarkable. There is a small amount of retained secretions or aspirate in the right main bronchus. Lungs/Pleura: There is moderate to severe emphysematous disease with centrilobular changes predominating and bullous disease in the right lower lobe. There is a small loculated posterolateral left basal pleural effusion, unchanged. Consolidative airspace disease in the left lower lobe, lingular base  and posterolateral right lower lobe is also unchanged in appearance compared with both prior studies. There are no new or further infiltrates. No lung nodule is seen. No layering pleural fluid on either side. Upper Abdomen: No acute findings. Mild features of hepatic cirrhosis. Scattered hepatic cysts. Musculoskeletal: Displaced fractures of the lateral left seventh and eighth ribs are again shown. There is extensive bridging enthesopathy of the spine, mild kyphosis. Review of the MIP images confirms the above findings. IMPRESSION: 1. No evidence of arterial dilatation or embolus. 2. Aortic and coronary artery atherosclerosis. 3. Small amount of retained secretions or aspirate in the right main bronchus. 4. Moderate to severe emphysema. 5. Stable small loculated left basal pleural effusion. 6. Consolidative airspace disease in the left lower lobe, lingular base and posterolateral right lower lobe, unchanged. 7. Displaced fractures of the lateral left seventh and eighth ribs, also seen on both prior studies. 8. Hepatic cirrhosis. Aortic Atherosclerosis (ICD10-I70.0) and Emphysema (ICD10-J43.9). Electronically Signed  By: Almira Bar M.D.   On: 10/08/2022 03:17   DG Chest Portable 1 View  Result Date: 10/08/2022 CLINICAL DATA:  Shortness of breath, COVID EXAM: PORTABLE CHEST 1 VIEW COMPARISON:  10/01/2022 FINDINGS: Patchy left mid lung and bilateral lower lobe opacities, mildly progressive, likely related to the patient's known COVID pneumonia. No definite pleural effusions.  No pneumothorax. The heart is top-normal in size. IMPRESSION: Multifocal pneumonia, mildly progressive. Electronically Signed   By: Charline Bills M.D.   On: 10/08/2022 00:43   DG Chest Port 1 View  Result Date: 10/01/2022 CLINICAL DATA:  Dyspnea EXAM: PORTABLE CHEST 1 VIEW COMPARISON:  09/27/2022 FINDINGS: Interstitial changes in keeping with underlying emphysema again noted, better visualized on CT examination of 09/21/2022.  Persistent bibasilar consolidation, more severe at the left lung base, stable since prior examination. Small left pleural effusion. No pneumothorax. Cardiac size within normal limits. No acute bone abnormality. IMPRESSION: 1. Persistent bibasilar pneumonic consolidation, more severe at the left lung base, similar to prior examination. 2. Small left pleural effusion. 3. Emphysema. Electronically Signed   By: Helyn Numbers M.D.   On: 10/01/2022 21:08   DG CHEST PORT 1 VIEW  Result Date: 09/27/2022 CLINICAL DATA:  Pneumonia EXAM: PORTABLE CHEST 1 VIEW COMPARISON:  Chest radiograph 09/25/2022 FINDINGS: The heart is enlarged, unchanged. The upper mediastinal contours are stable Left worse than right basilar opacities with associated pleural effusions are again seen, worsened on the left since the prior study. There is no pneumothorax There is no acute osseous abnormality. IMPRESSION: Bilateral pleural effusions and left worse than right basilar airspace opacity, worsened on the left since the prior study from 09/25/2022. Electronically Signed   By: Lesia Hausen M.D.   On: 09/27/2022 12:58    Microbiology: Results for orders placed or performed during the hospital encounter of 10/19/22  SARS Coronavirus 2 by RT PCR (hospital order, performed in [facility] hospital lab) *cepheid single result test* Anterior Nasal Swab     Status: None   Collection Time: 10/19/22 10:56 PM   Specimen: Anterior Nasal Swab  Result Value Ref Range Status   SARS Coronavirus 2 by RT PCR NEGATIVE NEGATIVE Final    Comment: (NOTE) SARS-CoV-2 target nucleic acids are NOT DETECTED.  The SARS-CoV-2 RNA is generally detectable in upper and lower respiratory specimens during the acute phase of infection. The lowest concentration of SARS-CoV-2 viral copies this assay can detect is 250 copies / mL. A negative result does not preclude SARS-CoV-2 infection and should not be used as the sole basis for treatment or other patient  management decisions.  A negative result may occur with improper specimen collection / handling, submission of specimen other than nasopharyngeal swab, presence of viral mutation(s) within the areas targeted by this assay, and inadequate number of viral copies (<250 copies / mL). A negative result must be combined with clinical observations, patient history, and epidemiological information.  Fact Sheet for Patients:   RoadLapTop.co.za  Fact Sheet for Healthcare Providers: http://kim-miller.com/  This test is not yet approved or  cleared by the [place] FDA and has been authorized for detection and/or diagnosis of SARS-CoV-2 by FDA under an Emergency Use Authorization (EUA).  This EUA will remain in effect (meaning this test can be used) for the duration of the COVID-19 declaration under Section 564(b)(1) of the Act, 21 U.S.C. section 360bbb-3(b)(1), unless the authorization is terminated or revoked sooner.  Performed at [facility], [address] [place], [place] [address]  Culture, blood (Routine X 2) w Reflex to ID Panel     Status: Abnormal   Collection Time: 10/20/22  2:10 AM   Specimen: BLOOD  Result Value Ref Range Status   Specimen Description   Final    BLOOD RIGHT ANTECUBITAL Performed at [facility], [address], [place], [place] [address]    Special Requests   Final    BOTTLES DRAWN AEROBIC AND ANAEROBIC Blood Culture results may not be optimal due to an excessive volume of blood received in culture bottles Performed at [facility], [address] [place], [place] [address]    Culture  Setup Time   Final    GRAM POSITIVE COCCI IN CLUSTERS ANAEROBIC BOTTLE ONLY CRITICAL RESULT CALLED TO, READ BACK BY AND VERIFIED WITH: PHARMD M. Fayrene Fearing 10/21/22 @ 0645 BY AB    Culture (A)  Final    STAPHYLOCOCCUS HOMINIS THE SIGNIFICANCE OF ISOLATING THIS ORGANISM  FROM A SINGLE SET OF BLOOD CULTURES WHEN MULTIPLE SETS ARE DRAWN IS UNCERTAIN. PLEASE NOTIFY THE MICROBIOLOGY DEPARTMENT WITHIN ONE WEEK IF SPECIATION AND SENSITIVITIES ARE REQUIRED. Performed at [facility], [address], [place], [place] [address]    Report Status 10/23/2022 FINAL  Final  Blood Culture ID Panel (Reflexed)     Status: Abnormal   Collection Time: 10/20/22  2:10 AM  Result Value Ref Range Status   Enterococcus faecalis NOT DETECTED NOT DETECTED Final   Enterococcus Faecium NOT DETECTED NOT DETECTED Final   Listeria monocytogenes NOT DETECTED NOT DETECTED Final   Staphylococcus species DETECTED (A) NOT DETECTED Final    Comment: CRITICAL RESULT CALLED TO, READ BACK BY AND VERIFIED WITH: PHARMD M. JAMES 10/21/22 @ 0645 BY AB    Staphylococcus aureus (BCID) NOT DETECTED NOT DETECTED Final   Staphylococcus epidermidis NOT DETECTED NOT DETECTED Final   Staphylococcus lugdunensis NOT DETECTED NOT DETECTED Final   Streptococcus species NOT DETECTED NOT DETECTED Final   Streptococcus agalactiae NOT DETECTED NOT DETECTED Final   Streptococcus pneumoniae NOT DETECTED NOT DETECTED Final   Streptococcus pyogenes NOT DETECTED NOT DETECTED Final   A.calcoaceticus-baumannii NOT DETECTED NOT DETECTED Final   Bacteroides fragilis NOT DETECTED NOT DETECTED Final   Enterobacterales NOT DETECTED NOT DETECTED Final   Enterobacter cloacae complex NOT DETECTED NOT DETECTED Final   Escherichia coli NOT DETECTED NOT DETECTED Final   Klebsiella aerogenes NOT DETECTED NOT DETECTED Final   Klebsiella oxytoca NOT DETECTED NOT DETECTED Final   Klebsiella pneumoniae NOT DETECTED NOT DETECTED Final   Proteus species NOT DETECTED NOT DETECTED Final   Salmonella species NOT DETECTED NOT DETECTED Final   Serratia marcescens NOT DETECTED NOT DETECTED Final   Haemophilus influenzae NOT DETECTED NOT DETECTED Final   Neisseria meningitidis NOT DETECTED NOT DETECTED Final   Pseudomonas aeruginosa  NOT DETECTED NOT DETECTED Final   Stenotrophomonas maltophilia NOT DETECTED NOT DETECTED Final   Candida albicans NOT DETECTED NOT DETECTED Final   Candida auris NOT DETECTED NOT DETECTED Final   Candida glabrata NOT DETECTED NOT DETECTED Final   Candida krusei NOT DETECTED NOT DETECTED Final   Candida parapsilosis NOT DETECTED NOT DETECTED Final   Candida tropicalis NOT DETECTED NOT DETECTED Final   Cryptococcus neoformans/gattii NOT DETECTED NOT DETECTED Final    Comment: Performed at [facility], [address], [place], [place] [address]  Culture, blood (Routine X 2) w Reflex to ID Panel     Status: None (Preliminary result)  Collection Time: 10/20/22  2:32 AM   Specimen: BLOOD  Result Value Ref Range Status   Specimen Description   Final    BLOOD RIGHT ANTECUBITAL Performed at [facility], [address] [place], [place] [address]    Special Requests   Final    BOTTLES DRAWN AEROBIC AND ANAEROBIC Blood Culture results may not be optimal due to an excessive volume of blood received in culture bottles Performed at [facility], [address] [place], [place] [address]    Culture   Final    NO GROWTH 4 DAYS Performed at [facility], [address] [place], [place] [address]    Report Status PENDING  Incomplete   Labs: CBC: Recent Labs  Lab 10/19/22 1854 10/20/22 0544 10/21/22 0408  WBC 13.9* 11.4* 12.4*  NEUTROABS 11.8*  --  8.0*  HGB 15.7 15.8 14.3  HCT 48.4 50.5 44.7  MCV 95.1 100.6* 96.8  PLT 380 263 285   Basic Metabolic Panel: Recent Labs  Lab 10/19/22 1854 10/20/22 0544 10/20/22 1155 10/21/22 0408  NA 134*  --  136 136  K 4.9  --  4.3 4.1  CL 98  --  100 103  CO2 24  --  26 24  GLUCOSE 144*  --  170* 164*  BUN 22  --  19 23  CREATININE 1.01 0.77 1.02 0.95  CALCIUM 9.2  --  8.9 8.1*  MG  --   --  2.4  --    Liver Function Tests: Recent Labs  Lab 10/19/22 1854  AST 17  ALT 19  ALKPHOS 92   BILITOT 0.4  PROT 7.5  ALBUMIN 3.7   CBG: No results for input(s): "GLUCAP" in the last 168 hours.  Discharge time spent: greater than 30 minutes.  Author: Lynden Oxford, MD  [facility] 10/21/2022

## 2022-10-29 ENCOUNTER — Emergency Department (HOSPITAL_COMMUNITY)
Admission: EM | Admit: 2022-10-29 | Discharge: 2022-10-30 | Disposition: A | Discharge status: Home / Self Care | Payer: No Typology Code available for payment source

## 2022-10-29 ENCOUNTER — Emergency Department (HOSPITAL_COMMUNITY): Payer: No Typology Code available for payment source

## 2022-10-29 ENCOUNTER — Encounter (HOSPITAL_COMMUNITY): Payer: Self-pay | Admitting: Emergency Medicine

## 2022-10-29 ENCOUNTER — Other Ambulatory Visit: Payer: Self-pay

## 2022-10-29 DIAGNOSIS — J449 Chronic obstructive pulmonary disease, unspecified: Secondary | ICD-10-CM | POA: Diagnosis present

## 2022-10-29 DIAGNOSIS — Z7951 Long term (current) use of inhaled steroids: Secondary | ICD-10-CM | POA: Diagnosis not present

## 2022-10-29 DIAGNOSIS — Z85118 Personal history of other malignant neoplasm of bronchus and lung: Secondary | ICD-10-CM | POA: Diagnosis not present

## 2022-10-29 DIAGNOSIS — J441 Chronic obstructive pulmonary disease with (acute) exacerbation: Secondary | ICD-10-CM | POA: Insufficient documentation

## 2022-10-29 DIAGNOSIS — C3492 Malignant neoplasm of unspecified part of left bronchus or lung: Secondary | ICD-10-CM | POA: Diagnosis present

## 2022-10-29 DIAGNOSIS — J189 Pneumonia, unspecified organism: Secondary | ICD-10-CM | POA: Diagnosis not present

## 2022-10-29 DIAGNOSIS — Z7984 Long term (current) use of oral hypoglycemic drugs: Secondary | ICD-10-CM | POA: Diagnosis not present

## 2022-10-29 DIAGNOSIS — J432 Centrilobular emphysema: Secondary | ICD-10-CM | POA: Diagnosis not present

## 2022-10-29 DIAGNOSIS — Z20822 Contact with and (suspected) exposure to covid-19: Secondary | ICD-10-CM | POA: Insufficient documentation

## 2022-10-29 DIAGNOSIS — R0902 Hypoxemia: Secondary | ICD-10-CM

## 2022-10-29 DIAGNOSIS — Z9981 Dependence on supplemental oxygen: Secondary | ICD-10-CM

## 2022-10-29 DIAGNOSIS — E119 Type 2 diabetes mellitus without complications: Secondary | ICD-10-CM | POA: Insufficient documentation

## 2022-10-29 DIAGNOSIS — J9611 Chronic respiratory failure with hypoxia: Secondary | ICD-10-CM | POA: Diagnosis not present

## 2022-10-29 DIAGNOSIS — R0602 Shortness of breath: Secondary | ICD-10-CM | POA: Diagnosis present

## 2022-10-29 LAB — COMPREHENSIVE METABOLIC PANEL
ALT: 16 U/L (ref 0–44)
AST: 15 U/L (ref 15–41)
Albumin: 3.5 g/dL (ref 3.5–5.0)
Alkaline Phosphatase: 82 U/L (ref 38–126)
Anion gap: 10 (ref 5–15)
BUN: 14 mg/dL (ref 8–23)
CO2: 24 mmol/L (ref 22–32)
Calcium: 8.7 mg/dL — ABNORMAL LOW (ref 8.9–10.3)
Chloride: 101 mmol/L (ref 98–111)
Creatinine, Ser: 1.09 mg/dL (ref 0.61–1.24)
GFR, Estimated: >60 mL/min (ref >60)
Glucose, Bld: 113 mg/dL — ABNORMAL HIGH (ref 70–99)
Potassium: 4.3 mmol/L (ref 3.5–5.1)
Sodium: 135 mmol/L (ref 135–145)
Total Bilirubin: 0.3 mg/dL (ref 0.3–1.2)
Total Protein: 7.3 g/dL (ref 6.5–8.1)

## 2022-10-29 LAB — CBC
HCT: 42.6 % (ref 39.0–52.0)
Hemoglobin: 14 g/dL (ref 13.0–17.0)
MCH: 31.2 pg (ref 26.0–34.0)
MCHC: 32.9 g/dL (ref 30.0–36.0)
MCV: 94.9 fL (ref 80.0–100.0)
Platelets: 265 10*3/uL (ref 150–400)
RBC: 4.49 MIL/uL (ref 4.22–5.81)
RDW: 14.6 % (ref 11.5–15.5)
WBC: 11 10*3/uL — ABNORMAL HIGH (ref 4.0–10.5)
nRBC: 0 % (ref 0.0–0.2)

## 2022-10-29 LAB — RESP PANEL BY RT-PCR (RSV, FLU A&B, COVID)  RVPGX2
Influenza A by PCR: NEGATIVE
Influenza B by PCR: NEGATIVE
Resp Syncytial Virus by PCR: NEGATIVE
SARS Coronavirus 2 by RT PCR: NEGATIVE

## 2022-10-29 MED ORDER — IOHEXOL 350 MG/ML SOLN
75.0000 mL | Freq: Once | INTRAVENOUS | Status: AC | PRN
  Administered 2022-10-29: 75 mL via INTRAVENOUS

## 2022-10-29 MED ORDER — METHYLPREDNISOLONE SODIUM SUCC 125 MG IJ SOLR
125.0000 mg | Freq: Once | INTRAMUSCULAR | Status: AC
  Administered 2022-10-29: 125 mg via INTRAVENOUS
  Filled 2022-10-29: qty 2

## 2022-10-29 MED ORDER — DILTIAZEM HCL ER COATED BEADS 180 MG PO CP24
180.0000 mg | ORAL_CAPSULE | Freq: Once | ORAL | Status: AC
  Administered 2022-10-29: 180 mg via ORAL
  Filled 2022-10-29: qty 1

## 2022-10-29 MED ORDER — IPRATROPIUM-ALBUTEROL 0.5-2.5 (3) MG/3ML IN SOLN
3.0000 mL | Freq: Once | RESPIRATORY_TRACT | Status: AC
  Administered 2022-10-29: 3 mL via RESPIRATORY_TRACT
  Filled 2022-10-29: qty 3

## 2022-10-29 MED ORDER — IPRATROPIUM-ALBUTEROL 0.5-2.5 (3) MG/3ML IN SOLN
3.0000 mL | Freq: Once | RESPIRATORY_TRACT | Status: AC
  Administered 2022-10-29: 3 mL via RESPIRATORY_TRACT
  Filled 2022-10-29: qty 9

## 2022-10-29 MED ORDER — OXYCODONE-ACETAMINOPHEN 5-325 MG PO TABS
1.0000 | ORAL_TABLET | Freq: Once | ORAL | Status: AC
  Administered 2022-10-29: 1 via ORAL
  Filled 2022-10-29: qty 1

## 2022-10-29 NOTE — ED Triage Notes (Signed)
Patient BIB EMS for evaluation of SHOB.  Reports hx of COPD and lung CA.  Baseline 4 L [place] at home.  States he has been having more difficulty breathing.  Worse with ambulation or excursion.  No reports of fevers.  Has had recent hospital admission.  VSS for EMS 127/69 94 % 4 L [place] 114 HR

## 2022-10-29 NOTE — ED Provider Notes (Signed)
[place] EMERGENCY DEPARTMENT AT [facility] Provider Note   CSN: [id number] Arrival date & time: 10/29/22  2016     History {Add pertinent medical, surgical, social history, OB history to HPI:1} Chief Complaint  Patient presents with   Shortness of Breath    Mario [name] is a 72 y.o. male.   Shortness of Breath Associated symptoms: chest pain   72 year old male with past medical history of COPD, diabetes, and lung cancer presents to the emergency department today with worsening shortness of breath.  Patient is normally on 3 L nasal cannula.  He states that over the past few days he is having worsening shortness of breath.  He denies any associated hemoptysis.  He states he is coughing up some sputum with this.  He states that he is starting to have some dyspnea at rest as well as with exertion.  He denies any associated fevers.  She has leg admitted for presumed more of a COPD exacerbation rather than pneumonia.  He states that with this he does have some intermittent chest discomfort mostly in the center of his chest that is more of a pressure sensation with coughing.     Home Medications Prior to Admission medications   Medication Sig Start Date End Date Taking? Authorizing Provider  albuterol (PROVENTIL) (2.5 MG/3ML) 0.083% nebulizer solution Inhale 3 mLs (2.5 mg total) by nebulization every 4 (four) hours as needed for wheezing or shortness of breath. 08/04/20 10/20/22  Rolly Salter, MD  albuterol (VENTOLIN HFA) 108 (90 Base) MCG/ACT inhaler Inhale 2 puffs into the lungs every 4 (four) hours as needed for wheezing or shortness of breath. 11/04/21   Glade Lloyd, MD  azithromycin (ZITHROMAX) 250 MG tablet Take 1 tablet (250 mg total) by mouth daily. Take first 2 tablets together, then 1 every day until finished. 10/16/22   Wynetta Fines, MD  buPROPion (WELLBUTRIN XL) 300 MG 24 hr tablet Take 300 mg by mouth every morning. 02/05/20   [provider]   busPIRone (BUSPAR) 15 MG tablet Take 15 mg by mouth daily.    [provider]  Carboxymethylcellulose Sod PF 0.5 % SOLN Place 1 drop into both eyes 4 (four) times daily as needed (dry eyes).    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    [provider]  cyclobenzaprine (FLEXERIL) 10 MG tablet Take 10 mg by mouth at bedtime.    [provider]  cycloSPORINE (RESTASIS) 0.05 % ophthalmic emulsion Place 1 drop into both eyes every 12 (twelve) hours.    [provider]  diltiazem (CARDIZEM CD) 180 MG 24 hr capsule Take 180 mg by mouth daily.    [provider]  DULoxetine (CYMBALTA) 60 MG capsule Take 1 capsule (60 mg total) by mouth 2 (two) times daily. 10/04/21 10/20/22  Earney Navy, NP  fluticasone (FLONASE) 50 MCG/ACT nasal spray Place 2 sprays into both nostrils daily.     [provider]  folic acid (FOLVITE) 1 MG tablet Take 1 mg by mouth daily.    [provider]  gabapentin (NEURONTIN) 100 MG capsule Take 300 mg by mouth 3 (three) times daily.    [provider]  lurasidone (LATUDA) 40 MG TABS tablet Take 40 mg by mouth every evening. After supper    [provider]  Melatonin 3 MG CAPS Take 6 mg by mouth  at bedtime as needed (sleep).    [provider]  metFORMIN (GLUCOPHAGE) 850 MG tablet Take 850 mg by mouth daily with breakfast.    [provider]  mycophenolate (CELLCEPT) 250 MG capsule Take 1,500 mg by mouth 2 (two) times daily.    [provider]  Nutritional Supplements (ENSURE ORIGINAL) LIQD Take 1 Bottle by mouth daily as needed (in between meals).    [provider]  ondansetron (ZOFRAN-ODT) 4 MG disintegrating tablet Take 1 tablet (4 mg total) by mouth every 8 (eight) hours as needed for nausea or vomiting. Patient not taking: Reported on 10/20/2022  10/04/21   Earney Navy, NP  oxcarbazepine (TRILEPTAL) 600 MG tablet Take 600 mg by mouth See admin instructions. Take 1 tablet BID and at bedtime    [provider]  oxyCODONE (OXY IR/ROXICODONE) 5 MG immediate release tablet Take 1-2 tablets (5-10 mg total) by mouth every 4 (four) hours as needed for severe pain. 09/10/22   Smitty Knudsen, PA-C  polyethylene glycol powder (GLYCOLAX/MIRALAX) 17 GM/SCOOP powder Take 17 g by mouth daily.    [provider]  prednisoLONE acetate (PRED FORTE) 1 % ophthalmic suspension Place 1 drop into both eyes See admin instructions. 1 drop every hour while awake Patient not taking: Reported on 10/20/2022    [provider]  predniSONE (DELTASONE) 10 MG tablet Take 4 tablets (40 mg total) by mouth daily. 10/16/22   Wynetta Fines, MD  tamsulosin (FLOMAX) 0.4 MG CAPS capsule Take 0.8 mg by mouth daily.    [provider]  Tiotropium Bromide-Olodaterol 2.5-2.5 MCG/ACT AERS Inhale 2 each into the lungs every morning. 2 puffs    [provider]  traZODone (DESYREL) 50 MG tablet Take 75 mg by mouth at bedtime.    [provider]      Allergies    Demerol [meperidine], Zocor [simvastatin], Beet [beta vulgaris], Collard greens [wild lettuce extract (lactuca virosa)], and Liver    Review of Systems   Review of Systems  Respiratory:  Positive for shortness of breath.   Cardiovascular:  Positive for chest pain.  All other systems reviewed and are negative.   Physical Exam Updated Vital Signs BP (!) 160/91   Pulse (!) 108   Temp 99.2 F (37.3 C) (Oral)   Resp 20   Ht 6\' 2"  (1.88 m)   Wt 136.1 kg   SpO2 96%   BMI 38.52 kg/m  Physical Exam Vitals and nursing note reviewed.   Gen: Chronically ill-appearing, mild conversational dyspnea noted Eyes: PERRL, EOMI HEENT: no oropharyngeal swelling Neck: trachea midline Resp: Coarse breath sounds at bilateral lung bases Card: Tachycardic Abd: nontender,  nondistended Extremities: no calf tenderness, no edema Vascular: 2+ radial pulses bilaterally, 2+ DP pulses bilaterally Skin: no rashes Psyc: acting appropriately   ED Results / Procedures / Treatments   Labs (all labs ordered are listed, but only abnormal results are displayed) Labs Reviewed  CBC - Abnormal; Notable for the following components:      Result Value   WBC 11.0 (*)    All other components within normal limits  RESP PANEL BY RT-PCR (RSV, FLU A&B, COVID)  [id number]  COMPREHENSIVE METABOLIC PANEL    EKG None  Radiology No results found.  Procedures Procedures  {Document cardiac monitor, telemetry assessment procedure when appropriate:1}  Medications Ordered in ED Medications  ipratropium-albuterol (DUONEB) 0.5-2.5 (3) MG/3ML nebulizer solution 3 mL (has no administration in time range)  ipratropium-albuterol (DUONEB) 0.5-2.5 (3)  MG/3ML nebulizer solution 3 mL (has no administration in time range)  ipratropium-albuterol (DUONEB) 0.5-2.5 (3) MG/3ML nebulizer solution 3 mL (has no administration in time range)  methylPREDNISolone sodium succinate (SOLU-MEDROL) 125 mg/2 mL injection 125 mg (125 mg Intravenous Given 10/29/22 2151)    ED Course/ Medical Decision Making/ A&P   {   Click here for ABCD2, HEART and other calculatorsREFRESH Note before signing :1}                              Medical Decision Making 72 year old male with past medical history of lung cancer, diabetes, COPD, and pulmonary embolism is no longer on anticoagulation presenting to the emergency department today with chest discomfort and shortness of breath.  Patient is tachycardic and borderline hypoxic here.  I will further evaluate him here with basic labs Wels and EKG, chest x-ray, troponin, BNP for further evaluation.  the patient is diminished with some coarse breath sounds.  Given his history of COPD I will give patient Solu-Medrol as well as DuoNebs here.  If his x-ray is unremarkable I will  consider further evaluation for pulmonary embolism.  I will reevaluate for ultimate disposition.  Amount and/or Complexity of Data Reviewed Labs: ordered. Radiology: ordered.  Risk Prescription drug management.   ***  {Document critical care time when appropriate:1} {Document review of labs and clinical decision tools ie heart score, Chads2Vasc2 etc:1}  {Document your independent review of radiology images, and any outside records:1} {Document your discussion with family members, caretakers, and with consultants:1} {Document social determinants of health affecting pt's care:1} {Document your decision making why or why not admission, treatments were needed:1} Final Clinical Impression(s) / ED Diagnoses Final diagnoses:  None    Rx / DC Orders ED Discharge Orders     None

## 2022-10-30 DIAGNOSIS — J432 Centrilobular emphysema: Secondary | ICD-10-CM

## 2022-10-30 DIAGNOSIS — C3492 Malignant neoplasm of unspecified part of left bronchus or lung: Secondary | ICD-10-CM | POA: Diagnosis not present

## 2022-10-30 DIAGNOSIS — J9611 Chronic respiratory failure with hypoxia: Secondary | ICD-10-CM

## 2022-10-30 DIAGNOSIS — Z9981 Dependence on supplemental oxygen: Secondary | ICD-10-CM | POA: Diagnosis not present

## 2022-10-30 LAB — PROCALCITONIN: Procalcitonin: <0.1 ng/mL

## 2022-10-30 MED ORDER — SODIUM CHLORIDE 0.9 % IV SOLN
2.0000 g | Freq: Once | INTRAVENOUS | Status: AC
  Administered 2022-10-30: 2 g via INTRAVENOUS
  Filled 2022-10-30: qty 12.5

## 2022-10-30 MED ORDER — VANCOMYCIN HCL 2000 MG/400ML IV SOLN
2000.0000 mg | Freq: Once | INTRAVENOUS | Status: AC
  Administered 2022-10-30: 2000 mg via INTRAVENOUS
  Filled 2022-10-30: qty 400

## 2022-10-30 MED ORDER — SODIUM CHLORIDE 0.9 % IV SOLN: 1.0000 g | Freq: Once | INTRAVENOUS | Status: DC

## 2022-10-30 NOTE — Assessment & Plan Note (Addendum)
Pt has had difficulty with his O2 levels dropping when performing any kind of physical activity. He states he noted his O2 sats dropping just by moving some items around his desk. Pt also has 25 feet of O2 tubing connected to his O2 concentrator at home. Discussed with him that he may need to compensate for his increased length of O2 tubing by increasing his O2 rate.  In addition, he will need to increase his O2 rate on his concentrator at least 5 mins before performing any physical activity such as walking, doing chores, and moving items around his desk. After looking at his most recent CT chest, his bilateral lower lobe consolidations are not significantly changed. I do not believe that he has pneumonia as the EDP charted. Nor do I believe he has a COPD exacerbation. Pt does have untreated Stage 4 non-small cell lung cancer. He says he follows up with his [place] oncologist. I do not have access to their notes. Pt states he has not been offered any chemo at this time. Pt had been seen by [facility] in 2023. [facility] thought pt was a poor candidate for standard chemo due to poor functional status.   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   I think pt's perceived dyspnea is a mismatch between what little O2 his lungs can absorb and how fast he is burning through his PO2 with activity. I discussed with him that it will be a trial and error process until he learns how much he can exert himself based on his current flow rate of O2.

## 2022-10-30 NOTE — Consult Note (Signed)
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  He has not had any fevers or chills.  He states that his shortness of breath is worsened with any sort of activity.  He was admitted to the hospital briefly on 8 21,024 and discharged the next day.  It was felt that he was neither having a COPD exacerbation or pneumonia.  Patient has not been receiving any sort of chemotherapy, immunotherapy or immune modulators for his stage IV lung cancer.  He had been seen by [facility] oncology in  September 2023.  He was felt to be a poor performance status candidate for regular chemotherapy.  The [facility] oncologist Dr. Tarri Fuller said "KRAS G12C therapy should be offered as first line therapy because he is not a good candidate for chemotherapy and immune therapy due to poor performance status and c-morbid medical problems ".  [facility].  Patient states that K-ras therapy was never offered to him.  Patient states that he has had regular follow-up with the [facility] oncologist but I cannot locate any of these notes.  Over the last several days, the patient has noticed he has increasing shortness of breath really with any activity.  He does have 25 feet of oxygen tubing connected to his concentrator.  He has not increased the flow  rate of his oxygen to compensate for this extra tubing.  He also has never increase the flow rate of his oxygen prior to any activity to see if this makes any difference.  He does note that he walks from his office to the kitchen, he is extremely short winded and and has to sit down when he gets to the kitchen.  On arrival to the ER, temp 98.2 heart rate 114 blood pressure 157/81 satting 93% on room air.  These vitals were obtained immediately after he walked into the exam room.  Labs white count 11.0, hemoglobin 14, platelets of 265  Sodium 135, potassium 4.3, BUN of 14, creatinine 1.09, bicarbonate 24  CTPA was negative for PE.  This showed unchanged consolidation in bilateral lower lobes and lingula since his 10/19/2022 CT.  EDP gave the patient IV antibiotics of cefepime and vancomycin and 125 mg of Solu-Medrol.  Patient was not have any wheezing at that time.  And he had no signs of sepsis.  [facility] hospitalist consulted.   ED Course: WBC 11. CTPA negative for PE. Unchanged bilateral lower lobe consolidation  Review of Systems:  Review of Systems  Constitutional: Negative.   HENT: Negative.    Eyes: Negative.   Respiratory:  Positive for cough. Negative for sputum production and wheezing.        DOE  Cardiovascular:  Negative for chest pain.  Gastrointestinal: Negative.   Genitourinary: Negative.   Musculoskeletal: Negative.   Skin: Negative.   Neurological: Negative.   Psychiatric/Behavioral: Negative.  All other systems reviewed and are negative.   Past Medical History:  Diagnosis Date   Ankylosing spondylitis lumbar region [facility]) 09/25/2022   Anxiety    Bronchitis    COPD (chronic obstructive pulmonary disease) (HCC)    Depression    History of radiation therapy    Left lung- 10/05/20-10/15/20- Dr. Antony Blackbird   Hypertension    lung ca 09/2020   MI (myocardial infarction) [facility])    ????   On home oxygen therapy    4L/min [place]   OSA (obstructive sleep apnea)     Suicide attempt (HCC)    Tension pneumothorax 06/27/2016   Uveitis     Past Surgical History:  Procedure Laterality Date   BIOPSY  07/03/2021   Procedure: BIOPSY;  Surgeon: Kathi Der, MD;  Location: [facility];  Service: Gastroenterology;;   BRONCHIAL BIOPSY  07/30/2020   Procedure: BRONCHIAL BIOPSIES;  Surgeon: Josephine Igo, DO;  Location: [facility];  Service: Pulmonary;;   BRONCHIAL BRUSHINGS  07/30/2020   Procedure: BRONCHIAL BRUSHINGS;  Surgeon: Josephine Igo, DO;  Location: [facility];  Service: Pulmonary;;   BRONCHIAL NEEDLE ASPIRATION BIOPSY  07/30/2020   Procedure: BRONCHIAL NEEDLE ASPIRATION BIOPSIES;  Surgeon: Josephine Igo, DO;  Location: [facility];  Service: Pulmonary;;   BRONCHIAL WASHINGS  07/30/2020   Procedure: BRONCHIAL WASHINGS;  Surgeon: Josephine Igo, DO;  Location: [facility];  Service: Pulmonary;;   CHEST TUBE INSERTION Left 06/27/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     ESOPHAGOGASTRODUODENOSCOPY N/A 07/03/2021   Procedure: ESOPHAGOGASTRODUODENOSCOPY (EGD);  Surgeon: Kathi Der, MD;  Location: [facility];  Service: Gastroenterology;  Laterality: N/A;   IR PERC PLEURAL DRAIN W/INDWELL CATH W/IMG GUIDE  08/04/2021   IR REMOVAL OF PLURAL CATH W/CUFF  09/01/2021   SKIN CANCER EXCISION     VIDEO BRONCHOSCOPY WITH ENDOBRONCHIAL NAVIGATION Left 07/30/2020   Procedure: VIDEO BRONCHOSCOPY WITH ENDOBRONCHIAL NAVIGATION;  Surgeon: Josephine Igo, DO;  Location: [facility];  Service: Pulmonary;  Laterality: Left;     reports that he quit smoking about 6 years ago. His smoking use included cigarettes. He started smoking about 41 years ago. He has a 35 pack-year smoking history. He has been exposed to tobacco smoke. He has never used smokeless tobacco. He reports that he does not drink alcohol and does not use drugs.  Allergies  Allergen Reactions   Demerol [Meperidine] Nausea And Vomiting and Other (See Comments)    Made the patient "violently sick"   Zocor  [Simvastatin] Nausea And Vomiting and Other (See Comments)    Made him very jittery, also   Beet [Beta Vulgaris] Nausea And Vomiting   Collard Greens [Wild Lettuce Extract (Lactuca Virosa)] Other (See Comments)    Throw up   Liver Nausea And Vomiting    Family History  Problem Relation Age of Onset   Dementia Father     Prior to Admission medications   Medication Sig Start Date End Date Taking? Authorizing Provider  albuterol (VENTOLIN HFA) 108 (90 Base) MCG/ACT inhaler Inhale 2 puffs into the lungs every 4 (four) hours as needed for wheezing or shortness of breath. 11/04/21  Yes Glade Lloyd, MD  ascorbic acid (VITAMIN C) 500 MG tablet Take 500 mg by mouth daily.   Yes [provider]  buPROPion (WELLBUTRIN XL) 300 MG 24 hr tablet Take 300 mg by mouth every morning. 02/05/20  Yes [provider]  busPIRone (BUSPAR) 15 MG tablet Take 15 mg by mouth daily.   Yes [provider]  Carboxymethylcellulose Sod PF 0.5 % SOLN Place 1 drop into both eyes 4 (four) times daily as needed (dry eyes).   Yes [provider]  cholecalciferol (VITAMIN D3) 25 MCG (1000 UNIT) tablet Take 1,000 Units by mouth daily.   Yes [provider]  clobetasol ointment (TEMOVATE) 0.05 % Apply 1 Application topically 2 (two) times daily as needed (scars on feet from vasculitis).   Yes [provider]  cyclobenzaprine (FLEXERIL) 10 MG tablet Take 10 mg by mouth at bedtime.   Yes [provider]  cycloSPORINE (RESTASIS) 0.05 % ophthalmic emulsion Place 1 drop into both eyes every 12 (twelve) hours.   Yes [provider]  diltiazem (CARDIZEM CD) 180 MG 24 hr capsule Take 180 mg by mouth daily.   Yes [provider]  DULoxetine (CYMBALTA) 60 MG capsule Take 60 mg by mouth 2 (two) times daily.   Yes [provider]  fluticasone (FLONASE) 50 MCG/ACT nasal spray Place 2 sprays into both nostrils daily.    Yes [provider]  folic  acid (FOLVITE) 1 MG tablet Take 1 mg by mouth daily.   Yes [provider]  gabapentin (NEURONTIN) 100 MG capsule Take 100 mg by mouth 3 (three) times daily.   Yes [provider]  GARLIC PO Take 1 tablet by mouth daily.   Yes [provider]  KRILL OIL PO Take 2 capsules by mouth daily.   Yes [provider]  lurasidone (LATUDA) 40 MG TABS tablet Take 40 mg by mouth every evening. After supper   Yes [provider]  Melatonin 3 MG CAPS Take 6 mg by mouth at bedtime.   Yes [provider]  metFORMIN (GLUCOPHAGE) 850 MG tablet Take 850 mg by mouth every evening.   Yes [provider]  Nutritional Supplements (ENSURE ORIGINAL) LIQD Take 1 Bottle by mouth daily as needed (in between meals).   Yes [provider]  oxcarbazepine (TRILEPTAL) 600 MG tablet Take 600 mg by mouth in the morning, at noon, and at bedtime.   Yes [provider]  oxyCODONE (OXY IR/ROXICODONE) 5 MG immediate release tablet Take 1-2 tablets (5-10 mg total) by mouth every 4 (four) hours as needed for severe pain. 09/10/22  Yes Maryanna Shape A, PA-C  polyethylene glycol powder (GLYCOLAX/MIRALAX) 17 GM/SCOOP powder Take 17 g by mouth daily.   Yes [provider]  tamsulosin (FLOMAX) 0.4 MG CAPS capsule Take 0.8 mg by mouth daily.   Yes [provider]  Tiotropium Bromide-Olodaterol 2.5-2.5 MCG/ACT AERS Inhale 2 each into the lungs every morning. 2 puffs   Yes [provider]  traZODone (DESYREL) 50 MG tablet Take 75 mg by mouth at bedtime.   Yes [provider]  vitamin B-12 (CYANOCOBALAMIN) 100 MCG tablet Take 100 mcg by mouth daily.   Yes [provider]  albuterol (PROVENTIL) (2.5 MG/3ML) 0.083% nebulizer solution Inhale 3 mLs (2.5 mg total) by nebulization every 4 (four) hours as needed for wheezing or shortness of breath. 08/04/20 10/20/22  Rolly Salter, MD  DULoxetine (CYMBALTA) 60 MG capsule Take 1 capsule  (60 mg total) by mouth 2 (two) times daily. 10/04/21 10/20/22  Earney Navy, NP    Physical Exam: Vitals:   10/30/22 0015 10/30/22 0330 10/30/22 0428 10/30/22 0450  BP: (!) 178/107 (!) 166/99 (!) 168/87 (!) 168/87  Pulse: (!) 110  96 96  Resp: (!) 23  18 18   Temp: 98 F (36.7 C)  (!)  97 F (36.1 C) (!) 97 F (36.1 C)  TempSrc:   Oral Oral  SpO2: 93%  100% 100%  Weight:      Height:        Physical Exam Vitals and nursing note reviewed.  Constitutional:      General: He is not in acute distress.    Appearance: He is obese. He is not toxic-appearing or diaphoretic.  HENT:     Nose: Nose normal.  Eyes:     General: No scleral icterus. Cardiovascular:     Rate and Rhythm: Normal rate and regular rhythm.  Pulmonary:     Effort: Pulmonary effort is normal. No respiratory distress.     Breath sounds: No wheezing or rales.  Abdominal:     General: Bowel sounds are normal. There is no distension.     Tenderness: There is no abdominal tenderness.  Skin:    General: Skin is warm and dry.     Capillary Refill: Capillary refill takes less than 2 seconds.  Neurological:     General: No focal deficit present.     Mental Status: He is oriented to person, place, and time.      Labs on Admission: I have personally reviewed following labs and imaging studies  CBC: Recent Labs  Lab 10/29/22 2145  WBC 11.0*  HGB 14.0  HCT 42.6  MCV 94.9  PLT 265   Basic Metabolic Panel: Recent Labs  Lab 10/29/22 2145  NA 135  K 4.3  CL 101  CO2 24  GLUCOSE 113*  BUN 14  CREATININE 1.09  CALCIUM 8.7*   GFR: Estimated Creatinine Clearance: 92.6 mL/min (by C-G formula based on SCr of 1.09 mg/dL). Liver Function Tests: Recent Labs  Lab 10/29/22 2145  AST 15  ALT 16  ALKPHOS 82  BILITOT 0.3  PROT 7.3  ALBUMIN 3.5   BNP (last 3 results) Recent Labs    10/01/22 2030 10/08/22 0016 10/19/22 1854  BNP 16.7 33.8 30.6   Radiological Exams on Admission: I have personally  reviewed images CT Angio Chest PE W and/or Wo Contrast  Result Date: 10/30/2022 CLINICAL DATA:  Shortness of breath. History of COPD and lung cancer. Wears baseline oxygen at home. EXAM: CT ANGIOGRAPHY CHEST WITH CONTRAST TECHNIQUE: Multidetector CT imaging of the chest was performed using the standard protocol during bolus administration of intravenous contrast. Multiplanar CT image reconstructions and MIPs were obtained to evaluate the vascular anatomy. RADIATION DOSE REDUCTION: This exam was performed according to the departmental dose-optimization program which includes automated exposure control, adjustment of the mA and/or kV according to patient size and/or use of iterative reconstruction technique. CONTRAST:  75mL OMNIPAQUE IOHEXOL 350 MG/ML SOLN COMPARISON:  Chest radiograph 10/29/2022 and CT chest 10/19/2022 FINDINGS: Cardiovascular: Negative for acute pulmonary embolism. Normal heart size. No pericardial effusion. Coronary artery and aortic atherosclerotic calcification. Mediastinum/Nodes: No thoracic adenopathy. Unremarkable esophagus. Layering debris in the posterior lower trachea extending into the right mainstem bronchus. Lungs/Pleura: Advanced emphysema. Bullous change in the right lung. Unchanged airspace consolidation and ground-glass opacities in the bilateral lower lobes since 10/19/2022. Unchanged airspace disease in the lingula. No pleural effusion or pneumothorax. Upper Abdomen: No acute abnormality. Musculoskeletal: Multilevel degenerative changes in the spine with bridging anterior enthesophytes. Unchanged appearance of the subacute-chronic lateral left seventh and eighth rib fractures. Review of the MIP images confirms the above findings. IMPRESSION: 1. Negative for acute pulmonary embolism. 2. Unchanged consolidation in the bilateral lower lobes and lingula since 10/19/2022. 3. Retained  secretions or aspirate in the lower trachea and right main bronchus. Aortic Atherosclerosis  (ICD10-I70.0) and Emphysema (ICD10-J43.9). Electronically Signed   By: Minerva Fester M.D.   On: 10/30/2022 00:05   DG Chest Port 1 View  Result Date: 10/29/2022 CLINICAL DATA:  Shortness of breath EXAM: PORTABLE CHEST 1 VIEW COMPARISON:  10/19/2022 FINDINGS: Airspace disease in the left lower lobe, similar prior study. Airspace opacity in the right lung base also similar to prior study. No effusions. Underlying COPD. Heart and mediastinal contours are within normal limits. No acute bony abnormality. IMPRESSION: Continued bibasilar opacities, left greater than right, similar to prior study. COPD. Electronically Signed   By: Charlett Nose M.D.   On: 10/29/2022 22:13    EKG: My personal interpretation of EKG shows: sinus tachycardia    Assessment/Plan Active Problems:   Chronic respiratory failure with hypoxia, on home O2 therapy (HCC) - 4 L/min   COPD (chronic obstructive pulmonary disease) (HCC)   Non-small cell cancer of 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    Assessment and Plan: Chronic respiratory failure with hypoxia, on home O2 therapy (HCC) - 4 L/min Pt has had difficulty with his O2 levels dropping when performing any kind of physical activity. He states he noted his O2 sats dropping just by moving some items around his desk. Pt also has 25 feet of O2 tubing connected to his O2 concentrator at home. Discussed with him that he may need to compensate for his increased length of O2 tubing by increasing his O2 rate.  In addition, he will need to increase his O2 rate on his concentrator at least 5 mins before performing any physical activity such as walking, doing chores, and moving items around his desk. After looking at his most recent CT chest, his bilateral lower lobe consolidations are not significantly changed. I do not believe that he has pneumonia as the EDP charted. Nor do I believe he has a COPD exacerbation. Pt does have untreated Stage 4 non-small cell lung cancer. He says he  follows up with his [place] oncologist. I do not have access to their notes. Pt states he has not been offered any chemo at this time. Pt had been seen by [facility] in 2023. [facility] oncology thought pt was a poor candidate for standard chemo due to poor functional status.   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   I think pt's perceived dyspnea is a mismatch between what little O2 his lungs can absorb and how fast he is burning through his PO2 with activity. I discussed with him that it will be a trial and error process until he learns how much he can exert himself based on his current flow rate of O2.  COPD (chronic obstructive pulmonary disease) (HCC) Not currently exacerbated.  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 Pt has KRAS G12C mutation. Pt has been seen by [facility] oncology in 2023. He was a poor performance candidate and was not offered traditional chemo.  [facility] recommendations were [facility] to [facility]. [facility]  Given that he has untreated Stage 4 NSCLC, I quite surprised he has not progressed in the last 12 months. Pt states he will be following up with his [place] oncologist soon.   Disposition Plan: home  Consults called: none  Admission status:  pt does not require hospital admission ,  medically stable for discharge to home. Pt to f/u with his [facility] PCP and  oncologist.   Carollee Herter, DO [facility] 10/30/2022, 4:57 AM

## 2022-10-30 NOTE — ED Notes (Signed)
Pt ambulated with pulse-ox approximately 28ft; maintain a 88% SpO2 with a steady heart rate of 120 occasionally jumping up to 130.

## 2022-10-30 NOTE — Subjective & Objective (Addendum)
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  He has not had any fevers or chills.  He states that his shortness of breath is worsened with any sort of activity.  He was admitted to the hospital briefly on 8 21,024 and discharged the next day.  It was felt that he was neither having a COPD exacerbation or pneumonia.  Patient has not been receiving any sort of chemotherapy, immunotherapy or immune modulators for his stage IV lung cancer.  He had been seen by [facility] oncology in  September 2023.  He was felt to be a poor performance status candidate for regular chemotherapy.  The [facility] oncologist Dr. Tarri Fuller said "KRAS G12C therapy should be offered as first line therapy because he is not a good candidate for chemotherapy and immune therapy due to poor performance status and c-morbid medical problems ".  [facility].  Patient states that K-ras therapy was never offered to him.  Patient states that he has had regular follow-up with the [facility] oncologist but I cannot locate any of these notes.  Over the last several days, the patient has noticed he has increasing shortness of breath really with any activity.  He does have 25 feet of oxygen tubing connected to his concentrator.  He has not increased the flow rate of his oxygen to compensate for this extra tubing.  He also has never increase the flow rate of his oxygen prior to any activity to see if this makes any difference.  He does note that he walks from his office to the kitchen, he is extremely short winded and and has to sit down when he gets to the kitchen.  On  arrival to the ER, temp 98.2 heart rate 114 blood pressure 157/81 satting 93% on room air.  These vitals were obtained immediately after he walked into the exam room.  Labs white count 11.0, hemoglobin 14, platelets of 265  Sodium 135, potassium 4.3, BUN of 14, creatinine 1.09, bicarbonate 24  CTPA was negative for PE.  This showed unchanged consolidation in bilateral lower lobes and lingula since his 10/19/2022 CT.  EDP gave the patient IV antibiotics of cefepime and vancomycin and 125 mg of Solu-Medrol.  Patient was not have any wheezing at that time.  And he had no signs of sepsis.  [facility] hospital[facility] consulted.

## 2022-10-30 NOTE — Assessment & Plan Note (Signed)
Not currently exacerbated.

## 2022-10-30 NOTE — Assessment & Plan Note (Signed)
Pt has KRAS G12C mutation. Pt has been seen by [facility] oncology in 2023. He was a poor performance candidate and was not offered traditional chemo.  [facility]ology recommendations were sent to [facility]. [facility]  Given that he has untreated Stage 4 NSCLC, I quite surprised he has not progressed in the last 12 months. Pt states he will be following up with his [place] oncologist soon.

## 2022-10-30 NOTE — Progress Notes (Signed)
A consult was received from an ED physician for Vancomycin per pharmacy dosing.  The patient's profile has been reviewed for ht/wt/allergies/indication/available labs.    A one time order has been placed for Vancomycin 2gm IV.  Cefepime dose adjusted to 2gm IV x 1 per antibiotic dose adjustment protocol.    Further antibiotics/pharmacy consults should be ordered by admitting physician if indicated.                       Thank you, Maryellen Pile, PharmD 10/30/2022  12:15 AM

## 2022-10-31 ENCOUNTER — Emergency Department (HOSPITAL_COMMUNITY)
Admission: EM | Admit: 2022-10-31 | Discharge: 2022-11-01 | Disposition: A | Discharge status: Home / Self Care | Payer: No Typology Code available for payment source | Attending: Emergency Medicine | Admitting: Emergency Medicine

## 2022-10-31 ENCOUNTER — Emergency Department (HOSPITAL_COMMUNITY): Payer: No Typology Code available for payment source

## 2022-10-31 ENCOUNTER — Other Ambulatory Visit: Payer: Self-pay

## 2022-10-31 DIAGNOSIS — R5383 Other fatigue: Secondary | ICD-10-CM | POA: Insufficient documentation

## 2022-10-31 DIAGNOSIS — Z9981 Dependence on supplemental oxygen: Secondary | ICD-10-CM | POA: Diagnosis not present

## 2022-10-31 DIAGNOSIS — Z85118 Personal history of other malignant neoplasm of bronchus and lung: Secondary | ICD-10-CM | POA: Diagnosis not present

## 2022-10-31 DIAGNOSIS — R0602 Shortness of breath: Secondary | ICD-10-CM | POA: Insufficient documentation

## 2022-10-31 DIAGNOSIS — Z760 Encounter for issue of repeat prescription: Secondary | ICD-10-CM | POA: Diagnosis not present

## 2022-10-31 DIAGNOSIS — Z7951 Long term (current) use of inhaled steroids: Secondary | ICD-10-CM | POA: Diagnosis not present

## 2022-10-31 DIAGNOSIS — I10 Essential (primary) hypertension: Secondary | ICD-10-CM | POA: Insufficient documentation

## 2022-10-31 DIAGNOSIS — J449 Chronic obstructive pulmonary disease, unspecified: Secondary | ICD-10-CM | POA: Diagnosis not present

## 2022-10-31 DIAGNOSIS — R0609 Other forms of dyspnea: Secondary | ICD-10-CM

## 2022-10-31 LAB — COMPREHENSIVE METABOLIC PANEL
ALT: 16 U/L (ref 0–44)
AST: 15 U/L (ref 15–41)
Albumin: 3.6 g/dL (ref 3.5–5.0)
Alkaline Phosphatase: 81 U/L (ref 38–126)
Anion gap: 11 (ref 5–15)
BUN: 16 mg/dL (ref 8–23)
CO2: 26 mmol/L (ref 22–32)
Calcium: 9.5 mg/dL (ref 8.9–10.3)
Chloride: 100 mmol/L (ref 98–111)
Creatinine, Ser: 1.03 mg/dL (ref 0.61–1.24)
GFR, Estimated: >60 mL/min (ref >60)
Glucose, Bld: 152 mg/dL — ABNORMAL HIGH (ref 70–99)
Potassium: 3.9 mmol/L (ref 3.5–5.1)
Sodium: 137 mmol/L (ref 135–145)
Total Bilirubin: 0.1 mg/dL — ABNORMAL LOW (ref 0.3–1.2)
Total Protein: 7.8 g/dL (ref 6.5–8.1)

## 2022-10-31 LAB — CBC
HCT: 43 % (ref 39.0–52.0)
Hemoglobin: 14 g/dL (ref 13.0–17.0)
MCH: 31 pg (ref 26.0–34.0)
MCHC: 32.6 g/dL (ref 30.0–36.0)
MCV: 95.1 fL (ref 80.0–100.0)
Platelets: 288 10*3/uL (ref 150–400)
RBC: 4.52 MIL/uL (ref 4.22–5.81)
RDW: 14.6 % (ref 11.5–15.5)
WBC: 12.4 10*3/uL — ABNORMAL HIGH (ref 4.0–10.5)
nRBC: 0 % (ref 0.0–0.2)

## 2022-10-31 MED ORDER — SODIUM CHLORIDE 0.9 % IV SOLN
500.0000 mg | INTRAVENOUS | Status: DC
  Administered 2022-10-31: 500 mg via INTRAVENOUS
  Filled 2022-10-31 (×2): qty 5

## 2022-10-31 MED ORDER — OXYCODONE-ACETAMINOPHEN 5-325 MG PO TABS
1.0000 | ORAL_TABLET | Freq: Four times a day (QID) | ORAL | Status: DC | PRN
  Administered 2022-10-31: 1 via ORAL
  Filled 2022-10-31: qty 1

## 2022-10-31 MED ORDER — SODIUM CHLORIDE 0.9 % IV SOLN: Freq: Once | INTRAVENOUS | Status: AC

## 2022-10-31 MED ORDER — SODIUM CHLORIDE 0.9 % IV SOLN
1.0000 g | INTRAVENOUS | Status: DC
  Administered 2022-10-31: 1 g via INTRAVENOUS
  Filled 2022-10-31 (×2): qty 10

## 2022-10-31 NOTE — ED Provider Notes (Cosign Needed Addendum)
[place] EMERGENCY DEPARTMENT AT [facility] Provider Note   CSN: [id number] Arrival date & time: 10/31/22  2044     History  Chief Complaint  Patient presents with   Shortness of Breath    Mario [name] is a 72 y.o. male.   Shortness of Breath 72 year old male history of COPD and metastatic non-small cell lung cancer presents to the ED due to concerns for increased work of breathing.  Patient is on 4 L of home oxygen but reports he is been titrating up to 6 L and still feels short of breath.  Patient reports decrease O2 sat at home even on 6 L.Says " my oxygen can go low as 86% even on 6L"  Of note, patient was discharged from the ED  on 8/31 due to the same presentation but after he left, he is been feeling more tired and more winded. Says he can't even go to the bathroom from his desk without stopping to catch to his breath.He uses CPAP at home nightly.   Denies any fever or chills at this time.   Home Medications Prior to Admission medications   Medication Sig Start Date End Date Taking? Authorizing Provider  albuterol (PROVENTIL) (2.5 MG/3ML) 0.083% nebulizer solution Inhale 3 mLs (2.5 mg total) by nebulization every 4 (four) hours as needed for wheezing or shortness of breath. 08/04/20 10/20/22  Rolly Salter, MD  albuterol (VENTOLIN HFA) 108 (90 Base) MCG/ACT inhaler Inhale 2 puffs into the lungs every 4 (four) hours as needed for wheezing or shortness of breath. 11/04/21   Glade Lloyd, MD  ascorbic acid (VITAMIN C) 500 MG tablet Take 500 mg by mouth daily.    [provider]  buPROPion (WELLBUTRIN XL) 300 MG 24 hr tablet Take 300 mg by mouth every morning. 02/05/20   [provider]  busPIRone (BUSPAR) 15 MG tablet Take 15 mg by mouth daily.    [provider]  Carboxymethylcellulose Sod PF 0.5 % SOLN Place 1 drop into both eyes 4 (four) times daily as needed (dry eyes).    [provider]  cholecalciferol (VITAMIN D3) 25  MCG (1000 UNIT) tablet Take 1,000 Units by mouth daily.    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    [provider]  cyclobenzaprine (FLEXERIL) 10 MG tablet Take 10 mg by mouth at bedtime.    [provider]  cycloSPORINE (RESTASIS) 0.05 % ophthalmic emulsion Place 1 drop into both eyes every 12 (twelve) hours.    [provider]  diltiazem (CARDIZEM CD) 180 MG 24 hr capsule Take 180 mg by mouth daily.    [provider]  DULoxetine (CYMBALTA) 60 MG capsule Take 1 capsule (60 mg total) by mouth 2 (two) times daily. 10/04/21 10/20/22  Earney Navy, NP  DULoxetine (CYMBALTA) 60 MG capsule Take 60 mg by mouth 2 (two) times daily.    [provider]  fluticasone (FLONASE) 50 MCG/ACT nasal spray Place 2 sprays into both nostrils daily.     [provider]  folic acid (FOLVITE) 1 MG tablet Take 1 mg by mouth daily.    [provider]  gabapentin (NEURONTIN) 100 MG capsule Take 100 mg by mouth 3 (three) times daily.    [provider]  GARLIC PO Take 1 tablet by mouth daily.    [provider]  KRILL OIL PO  Take 2 capsules by mouth daily.    [provider]  lurasidone (LATUDA) 40 MG TABS tablet Take 40 mg by mouth every evening. After supper    [provider]  Melatonin 3 MG CAPS Take 6 mg by mouth at bedtime.    [provider]  metFORMIN (GLUCOPHAGE) 850 MG tablet Take 850 mg by mouth every evening.    [provider]  Nutritional Supplements (ENSURE ORIGINAL) LIQD Take 1 Bottle by mouth daily as needed (in between meals).    [provider]  oxcarbazepine (TRILEPTAL) 600 MG tablet Take 600 mg by mouth in the morning, at noon, and at bedtime.    [provider]  oxyCODONE (OXY IR/ROXICODONE) 5 MG immediate release tablet Take 1-2 tablets (5-10 mg  total) by mouth every 4 (four) hours as needed for severe pain. 09/10/22   Smitty Knudsen, PA-C  polyethylene glycol powder (GLYCOLAX/MIRALAX) 17 GM/SCOOP powder Take 17 g by mouth daily.    [provider]  tamsulosin (FLOMAX) 0.4 MG CAPS capsule Take 0.8 mg by mouth daily.    [provider]  Tiotropium Bromide-Olodaterol 2.5-2.5 MCG/ACT AERS Inhale 2 each into the lungs every morning. 2 puffs    [provider]  traZODone (DESYREL) 50 MG tablet Take 75 mg by mouth at bedtime.    [provider]  vitamin B-12 (CYANOCOBALAMIN) 100 MCG tablet Take 100 mcg by mouth daily.    [provider]      Allergies    Demerol [meperidine], Zocor [simvastatin], Beet [beta vulgaris], Collard greens [wild lettuce extract (lactuca virosa)], and Liver    Review of Systems   Review of Systems  Respiratory:  Positive for shortness of breath.     Physical Exam Updated Vital Signs BP (!) 135/101   Pulse 95   Temp (!) 97.5 F (36.4 C) (Oral)   Resp 20   SpO2 100%  Physical Exam Constitutional:      General: He is not in acute distress.    Appearance: He is obese. He is not ill-appearing, toxic-appearing or diaphoretic.  HENT:     Head: Normocephalic and atraumatic.  Pulmonary:     Effort: Respiratory distress present.     Comments: Wheezing noted on bilaterally     ED Results / Procedures / Treatments   Labs (all labs ordered are listed, but only abnormal results are displayed) Labs Reviewed  CBC - Abnormal; Notable for the following components:      Result Value   WBC 12.4 (*)    All other components within normal limits  COMPREHENSIVE METABOLIC PANEL - Abnormal; Notable for the following components:   Glucose, Bld 152 (*)    Total Bilirubin 0.1 (*)    All other components within normal limits    EKG EKG Interpretation Date/Time:  Monday October 31 2022 21:02:57 EDT Ventricular Rate:  114 PR Interval:  164 QRS Duration:  122 QT  Interval:  331 QTC Calculation: 456 R Axis:   -6  Text Interpretation: Sinus tachycardia Nonspecific intraventricular conduction delay ST elevation, consider inferior injury No significant change since last tracing Confirmed by Lorre Nick ([id number]) on 10/31/2022 9:21:13 PM  Radiology DG Chest 2 View  Result Date: 10/31/2022 CLINICAL DATA:  sob EXAM: CHEST - 2 VIEW COMPARISON:  10/29/2022 FINDINGS: Persistent dense consolidation at the left lung base. Coarse linear opacities in the right middle lobe stable. Heart size and mediastinal contours are within normal limits. Blunting of bilateral costophrenic angles. Vertebral  endplate spurring at multiple levels in the mid and lower thoracic spine. IMPRESSION: Persistent left basilar consolidation. Electronically Signed   By: Corlis Leak M.D.   On: 10/31/2022 22:02    Procedures Procedures    Medications Ordered in ED Medications  azithromycin (ZITHROMAX) 500 mg in sodium chloride 0.9 % 250 mL IVPB (500 mg Intravenous New Bag/Given 10/31/22 2305)  cefTRIAXone (ROCEPHIN) 1 g in sodium chloride 0.9 % 100 mL IVPB (0 g Intravenous Stopped 10/31/22 2259)  oxyCODONE-acetaminophen (PERCOCET/ROXICET) 5-325 MG per tablet 1 tablet (1 tablet Oral Given 10/31/22 2319)  0.9 %  sodium chloride infusion ( Intravenous New Bag/Given 10/31/22 2215)    ED Course/ Medical Decision Making/ A&P                                 Medical Decision Making Amount and/or Complexity of Data Reviewed Labs: ordered. Radiology: ordered.    Details: Chest XRAY ECG/medicine tests: ordered.  Risk Prescription drug management.   #Concerns for COPD Excerebration  72 year old male history of COPD on home oxygen 4 L presented to the ED after being discharged about 2 days ago due to concerns of increased work of breathing despite titrating his oxygen from 4 L to 6 L.  Says he cannot even walk to the bathroom without getting winded after discharged.  CT angio of the chest done on  10/29/2022 did not reveal  pulmonary embolism or any acute pulmonary process at that time. However, chest XR today showed Persistent left basilar consolidation unchanged from prior. Patient is also complaining of a lower back pain. - Start Azithromycin 500 MG IV - Start Ceftriaxone 1 g  - Start Percocet for back pain  - Consult hospitalist for admitting         Final Clinical Impression(s) / ED Diagnoses Final diagnoses:  None    Rx / DC Orders ED Discharge Orders     None         Kathleen Lime, MD 10/31/22 2337    Kathleen Lime, MD 11/01/22 0000    Lorre Nick, MD 11/01/22 1627

## 2022-10-31 NOTE — ED Triage Notes (Signed)
Pt BIB [facility] from home. Pt c/o of SOB today. Pt sats 88 on RA 94% on 4L [place]. Pt took breathing treatment with no improvements at 6pm.  110 HR 18RR 138/98. 94% 4L [place] 200CBG

## 2022-10-31 NOTE — ED Provider Notes (Signed)
I saw and evaluated the patient, reviewed the resident's note and I agree with the findings and plan.  EKG Interpretation Date/Time:  Monday October 31 2022 21:02:57 EDT Ventricular Rate:  114 PR Interval:  164 QRS Duration:  122 QT Interval:  331 QTC Calculation: 456 R Axis:   -6  Text Interpretation: Sinus tachycardia Nonspecific intraventricular conduction delay ST elevation, consider inferior injury No significant change since last tracing Confirmed by Lorre Nick ([id number]) on 10/31/2022 9:21:13 PM   Patient is EKG per interpretation shows sinus tach.  Patient presented here complaint of shortness of breath.  Seen in the ED several days ago for similar symptoms.  Patient been recommended for admission but after being seen by the hospitalist physician, felt that patient could be managed as an outpatient.  States that since that time he has been more short of breath especially with exertion.  Has had to increase his home oxygen up to 6 L and despite this when he has any ambulation of short distances has become severely dyspneic.  Patient's chest x-ray today shows persistent left basal consolidation.  Suspect that he has pneumonia.  Will start on IV antibiotics and admit to the hospitalist service   Lorre Nick, MD 10/31/22 2227

## 2022-11-01 ENCOUNTER — Emergency Department (HOSPITAL_COMMUNITY)
Admission: EM | Admit: 2022-11-01 | Discharge: 2022-11-01 | Disposition: A | Discharge status: Home / Self Care | Payer: No Typology Code available for payment source | Source: Home / Self Care | Attending: Emergency Medicine | Admitting: Emergency Medicine

## 2022-11-01 DIAGNOSIS — Z760 Encounter for issue of repeat prescription: Secondary | ICD-10-CM | POA: Insufficient documentation

## 2022-11-01 DIAGNOSIS — Z9981 Dependence on supplemental oxygen: Secondary | ICD-10-CM | POA: Insufficient documentation

## 2022-11-01 DIAGNOSIS — J449 Chronic obstructive pulmonary disease, unspecified: Secondary | ICD-10-CM | POA: Insufficient documentation

## 2022-11-01 DIAGNOSIS — I10 Essential (primary) hypertension: Secondary | ICD-10-CM | POA: Insufficient documentation

## 2022-11-01 DIAGNOSIS — Z85118 Personal history of other malignant neoplasm of bronchus and lung: Secondary | ICD-10-CM | POA: Insufficient documentation

## 2022-11-01 MED ORDER — LEVALBUTEROL HCL 0.63 MG/3ML IN NEBU
0.6300 mg | INHALATION_SOLUTION | Freq: Once | RESPIRATORY_TRACT | Status: AC
  Administered 2022-11-01: 0.63 mg via RESPIRATORY_TRACT
  Filled 2022-11-01: qty 3

## 2022-11-01 MED ORDER — IBUPROFEN 200 MG PO TABS
600.0000 mg | ORAL_TABLET | Freq: Once | ORAL | Status: AC
  Administered 2022-11-01: 600 mg via ORAL
  Filled 2022-11-01: qty 3

## 2022-11-01 MED ORDER — SODIUM CHLORIDE 0.9 % IV BOLUS
1000.0000 mL | Freq: Once | INTRAVENOUS | Status: AC
  Administered 2022-11-01: 1000 mL via INTRAVENOUS

## 2022-11-01 MED ORDER — ACETAMINOPHEN 500 MG PO TABS
1000.0000 mg | ORAL_TABLET | Freq: Once | ORAL | Status: AC
  Administered 2022-11-01: 1000 mg via ORAL
  Filled 2022-11-01: qty 2

## 2022-11-01 NOTE — Discharge Instructions (Signed)
Please follow discharge instructions given to you upon prior discharge.  Please return with any new or worsening symptoms.

## 2022-11-01 NOTE — Progress Notes (Signed)
   Pt seen last night for similar problem. DOE. Pt procalcitonin has been negative yesterday and on 10-20-2022, 10-01-2022, 09-26-2022. All consistent the he does NOT have an active infection.  Furthermore, comparing his various CT chests from 10-29-2022, 10-19-2022, 10-08-2022, 09-21-2022, 06-2022, he as persistent bibasilar/lower lobe consolidation that is unchanged.  He does NOT have active pneumonia and does not need abx therapy.  As stated in my consult note last night, pt's biggest issue is his severe deconditioning and O2/activity mismatch.  Pt has little to no O2 reserve given his advanced lung diseased and untreated lung cancer. As I stated yesterday, oncology at [facility], [place] and [place] have been unwilling to treat his Stage 4 lung cancer with traditional chemo or alternative agents due to his poor performance status. Pt was referred back to his oncologist at the [facility] to discuss this further.  Unfortunately, the only recommendation for the patient is that he needs to increase his O2 rate on his concentrator prior to ANY physical activity (anything more than just sitting) and slow down the pace of his activity.  Carollee Herter, DO [facility]

## 2022-11-01 NOTE — ED Provider Notes (Signed)
I assumed care of this patient from previous provider.  Please see their note for further details of history, exam, and MDM.   Briefly patient is a 72 y.o. male who presented with past medical history of COPD and metastatic non-small cell lung cancer here for increased work of breathing and lower saturations while walking.  Patient was seen by the previous team.  Workup was otherwise reassuring.  Chest x-ray with stable consolidations in the lower lobes.  Unchanged from prior.  Patient was actually seen yesterday for same and evaluated by hospitalist who did not see need for admission.  At that time he had a CTA negative for PE which also showed the stable consolidations.  Plan was to have patient ambulate with pulse ox and reevaluate for possible admission.  On 4 L, patient saturations dropped to 89%.  Heart rate increased in the 120s.  He was given IV fluids.  Upon reassessment, heart rate only increased to the low 100s.  Patient feels stable to be discharged home.  [facility].  The patient appears reasonably screened and/or stabilized for discharge and I doubt any other medical condition or other [facility] requiring further screening, evaluation, or treatment in the ED at this time. I have discussed the findings, Dx and Tx plan with the patient/family who expressed understanding and agree(s) with the plan. Discharge instructions discussed at length. The patient/family was given strict return precautions who verbalized understanding of the instructions. No further questions at time of discharge.  Disposition: Discharge  Condition: Good  ED Discharge Orders     None        Follow Up: [facility], [name] [address] [place] [address] [phone]  Go to  as scheduled         Nira Conn, MD 11/01/22 [phone]

## 2022-11-01 NOTE — ED Provider Notes (Cosign Needed Addendum)
[place] EMERGENCY DEPARTMENT AT [facility] Provider Note   CSN: [id number] Arrival date & time: 11/01/22  6962     History  Chief Complaint  Patient presents with   Medication Refill    Mario [name] is a 72 y.o. male with medical history of lung cancer, tension pneumothorax, COPD, hypertension, MI, home oxygen use.  Patient presents to ED for evaluation.  The patient was just discharged from this hospital 2 hours ago.  Patient reports that while receiving a ride home, his portable home oxygen that he is chronically on ran out.  He reports that he called EMS who was unable to transport him back to his house where he has oxygen waiting for him.  He was transported back to the ED where he will need convalescent transport home due to oxygen status.  Patient denies any new complaints.  The patient is requesting medication for a headache that he reports is from an ambulance ride he took 2 days ago, "ambulance had no suspension".  Denies any new or worsening symptoms of shortness of breath.  Denies any new concerns.   Medication Refill      Home Medications Prior to Admission medications   Medication Sig Start Date End Date Taking? Authorizing Provider  albuterol (PROVENTIL) (2.5 MG/3ML) 0.083% nebulizer solution Inhale 3 mLs (2.5 mg total) by nebulization every 4 (four) hours as needed for wheezing or shortness of breath. 08/04/20 10/20/22  Rolly Salter, MD  albuterol (VENTOLIN HFA) 108 (90 Base) MCG/ACT inhaler Inhale 2 puffs into the lungs every 4 (four) hours as needed for wheezing or shortness of breath. 11/04/21   Glade Lloyd, MD  ascorbic acid (VITAMIN C) 500 MG tablet Take 500 mg by mouth daily.    [provider]  buPROPion (WELLBUTRIN XL) 300 MG 24 hr tablet Take 300 mg by mouth every morning. 02/05/20   [provider]  busPIRone (BUSPAR) 15 MG tablet Take 15 mg by mouth daily.    [provider]  Carboxymethylcellulose Sod PF 0.5 % SOLN  Place 1 drop into both eyes 4 (four) times daily as needed (dry eyes).    [provider]  cholecalciferol (VITAMIN D3) 25 MCG (1000 UNIT) tablet Take 1,000 Units by mouth daily.    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    [provider]  cyclobenzaprine (FLEXERIL) 10 MG tablet Take 10 mg by mouth at bedtime.    [provider]  cycloSPORINE (RESTASIS) 0.05 % ophthalmic emulsion Place 1 drop into both eyes every 12 (twelve) hours.    [provider]  diltiazem (CARDIZEM CD) 180 MG 24 hr capsule Take 180 mg by mouth daily.    [provider]  DULoxetine (CYMBALTA) 60 MG capsule Take 1 capsule (60 mg total) by mouth 2 (two) times daily. 10/04/21 10/20/22  Earney Navy, NP  DULoxetine (CYMBALTA) 60 MG capsule Take 60 mg by mouth 2 (two) times daily.    [provider]  fluticasone (FLONASE) 50 MCG/ACT nasal spray Place 2 sprays into both nostrils daily.     [provider]  folic acid (FOLVITE) 1 MG tablet Take 1 mg by mouth daily.    [provider]  gabapentin (NEURONTIN) 100 MG capsule Take 100 mg by mouth 3 (three) times daily.    [provider]  GARLIC PO Take 1 tablet by mouth  daily.    [provider]  KRILL OIL PO Take 2 capsules by mouth daily.    [provider]  lurasidone (LATUDA) 40 MG TABS tablet Take 40 mg by mouth every evening. After supper    [provider]  Melatonin 3 MG CAPS Take 6 mg by mouth at bedtime.    [provider]  metFORMIN (GLUCOPHAGE) 850 MG tablet Take 850 mg by mouth every evening.    [provider]  Nutritional Supplements (ENSURE ORIGINAL) LIQD Take 1 Bottle by mouth daily as needed (in between meals).    [provider]  oxcarbazepine (TRILEPTAL) 600 MG tablet Take 600 mg by mouth in the morning, at noon,  and at bedtime.    [provider]  oxyCODONE (OXY IR/ROXICODONE) 5 MG immediate release tablet Take 1-2 tablets (5-10 mg total) by mouth every 4 (four) hours as needed for severe pain. 09/10/22   Smitty Knudsen, PA-C  polyethylene glycol powder (GLYCOLAX/MIRALAX) 17 GM/SCOOP powder Take 17 g by mouth daily.    [provider]  tamsulosin (FLOMAX) 0.4 MG CAPS capsule Take 0.8 mg by mouth daily.    [provider]  Tiotropium Bromide-Olodaterol 2.5-2.5 MCG/ACT AERS Inhale 2 each into the lungs every morning. 2 puffs    [provider]  traZODone (DESYREL) 50 MG tablet Take 75 mg by mouth at bedtime.    [provider]  vitamin B-12 (CYANOCOBALAMIN) 100 MCG tablet Take 100 mcg by mouth daily.    [provider]      Allergies    Demerol [meperidine], Zocor [simvastatin], Beet [beta vulgaris], Collard greens [wild lettuce extract (lactuca virosa)], and Liver    Review of Systems   Review of Systems  All other systems reviewed and are negative.   Physical Exam Updated Vital Signs BP (!) 162/90 (BP Location: Right Arm)   Pulse 100   Temp 98 F (36.7 C) (Oral)   SpO2 94%  Physical Exam Vitals and nursing note reviewed.  Constitutional:      General: He is not in acute distress.    Appearance: He is well-developed.  HENT:     Head: Normocephalic and atraumatic.  Eyes:     Conjunctiva/sclera: Conjunctivae normal.  Cardiovascular:     Rate and Rhythm: Normal rate and regular rhythm.     Heart sounds: No murmur heard. Pulmonary:     Effort: Pulmonary effort is normal. No respiratory distress.     Breath sounds: Normal breath sounds.  Abdominal:     Palpations: Abdomen is soft.     Tenderness: There is no abdominal tenderness.  Musculoskeletal:        General: No swelling.     Cervical back: Neck supple.  Skin:    General: Skin is warm and dry.     Capillary Refill: Capillary refill takes less than 2 seconds.  Neurological:      Mental Status: He is alert.  Psychiatric:        Mood and Affect: Mood normal.     ED Results / Procedures / Treatments   Labs (all labs ordered are listed, but only abnormal results are displayed) Labs Reviewed - No data to display  EKG None  Radiology DG Chest 2 View  Result Date: 10/31/2022 CLINICAL DATA:  sob EXAM: CHEST - 2 VIEW COMPARISON:  10/29/2022 FINDINGS: Persistent dense consolidation at the left lung base. Coarse linear opacities in the right middle lobe stable. Heart size and mediastinal contours are  within normal limits. Blunting of bilateral costophrenic angles. Vertebral endplate spurring at multiple levels in the mid and lower thoracic spine. IMPRESSION: Persistent left basilar consolidation. Electronically Signed   By: Corlis Leak M.D.   On: 10/31/2022 22:02    Procedures Procedures   Medications Ordered in ED Medications  acetaminophen (TYLENOL) tablet 1,000 mg (has no administration in time range)  ibuprofen (ADVIL) tablet 600 mg (has no administration in time range)    ED Course/ Medical Decision Making/ A&P  Medical Decision Making Risk OTC drugs.   6-year-old male presents to ED for evaluation.  Please see HPI for further details.  On examination the patient is afebrile and nontachycardic. Not hypoxic on chronic oxygen.  Abdomen soft and compressible throughout.  Neurological examination at baseline.  Patient states that his portable home oxygen ran out on the way home.  Patient denies any new or worsening symptoms, shortness of breath.  Patient states that he was brought back here as EMS was unable to take him back to his house.  He is currently without a ride home, states that he has no portable oxygen.  He reports that he does have home oxygen at home however portable oxygen has ran out.  Will contact [place] for safe transport back to patient's house.  Patient discharged stable condition.  Patient will remain pending  dispo and on hospital oxygen until [facility] has arrived.   Final Clinical Impression(s) / ED Diagnoses Final diagnoses:  Oxygen dependent    Rx / DC Orders ED Discharge Orders     None         Clent Ridges 11/01/22 0701    Nira Conn, MD 11/01/22 0716    Al Decant, PA-C 11/01/22 0802    Nira Conn, MD 11/03/22 2101

## 2022-11-01 NOTE — ED Triage Notes (Signed)
Pt was DC'd appx 1 hour ago, seen here for SOB , pt car broke down on the way home and he ran out of portable O2. Pt called EMS , EMS unable to take pt home, pt was brought here for ride home,. Pt does have 02 at home that is not empty. 4LNC baseline r/t lung ca

## 2022-11-02 ENCOUNTER — Emergency Department (HOSPITAL_COMMUNITY)
Admission: EM | Admit: 2022-11-02 | Discharge: 2022-11-03 | Disposition: A | Discharge status: Home / Self Care | Payer: No Typology Code available for payment source | Attending: Emergency Medicine | Admitting: Emergency Medicine

## 2022-11-02 ENCOUNTER — Emergency Department (HOSPITAL_COMMUNITY): Payer: No Typology Code available for payment source

## 2022-11-02 DIAGNOSIS — R0602 Shortness of breath: Secondary | ICD-10-CM | POA: Diagnosis present

## 2022-11-02 DIAGNOSIS — Z85118 Personal history of other malignant neoplasm of bronchus and lung: Secondary | ICD-10-CM | POA: Diagnosis not present

## 2022-11-02 DIAGNOSIS — I10 Essential (primary) hypertension: Secondary | ICD-10-CM | POA: Diagnosis not present

## 2022-11-02 DIAGNOSIS — I2510 Atherosclerotic heart disease of native coronary artery without angina pectoris: Secondary | ICD-10-CM | POA: Diagnosis not present

## 2022-11-02 DIAGNOSIS — J441 Chronic obstructive pulmonary disease with (acute) exacerbation: Secondary | ICD-10-CM | POA: Diagnosis not present

## 2022-11-02 DIAGNOSIS — Z7951 Long term (current) use of inhaled steroids: Secondary | ICD-10-CM | POA: Insufficient documentation

## 2022-11-02 MED ORDER — OXYCODONE HCL 5 MG PO TABS
5.0000 mg | ORAL_TABLET | Freq: Once | ORAL | Status: AC
  Administered 2022-11-02: 5 mg via ORAL
  Filled 2022-11-02: qty 1

## 2022-11-02 MED ORDER — IPRATROPIUM-ALBUTEROL 0.5-2.5 (3) MG/3ML IN SOLN
3.0000 mL | Freq: Once | RESPIRATORY_TRACT | Status: AC
  Administered 2022-11-02: 3 mL via RESPIRATORY_TRACT
  Filled 2022-11-02: qty 3

## 2022-11-02 NOTE — ED Triage Notes (Signed)
Arrived via EMS from home for SOB. Normally on 4l.. ambulated today and oxygen dropped to 86%. Had to bump self up to 5L. 115/64 93 RR 92% 5L 128

## 2022-11-02 NOTE — ED Provider Notes (Signed)
Care assumed from Dr. Particia Nearing, patient with shortness of breath pending CT angiogram of chest to evaluate pulmonary opacities. Also pending labs. He received a nebulizer treatment with albuterol/ipratropium. Will need to reassess.  I have reviewed his laboratory tests, my interpretation is normal WBC, anemia which is new compared with 10/31/2022, but at the same level he was on 09/26/2022 which improved without any transfusions.  Basic metabolic panel showed elevated random glucose at a level that he has been at previously, normal electrolytes and BUN and creatinine and calcium.  He is maintaining good oxygen saturation on oxygen via nasal cannula.  When I attempted to ambulate him, he is complaining of feeling lightheaded but his vital signs remain normal.  CT scan shows no evidence of pulmonary emboli, persistent consolidation in the lower lobes.  I have independently reviewed the images, and agree with the radiologist's interpretation but consolidation does seem to be gradually improving.  At this point, I do not feel he needs hospitalization.  He may need to transition from oxygen concentrator to actual oxygen tanks as his need for oxygen increases.  He will need to discuss this with his primary care provider with [facility].  Results for orders placed or performed during the hospital encounter of 11/02/22  Basic metabolic panel  Result Value Ref Range   Sodium 135 135 - 145 mmol/L   Potassium 3.8 3.5 - 5.1 mmol/L   Chloride 100 98 - 111 mmol/L   CO2 24 22 - 32 mmol/L   Glucose, Bld 139 (H) 70 - 99 mg/dL   BUN 16 8 - 23 mg/dL   Creatinine, Ser 3.24 0.61 - 1.24 mg/dL   Calcium 8.9 8.9 - 40.1 mg/dL   GFR, Estimated >02 >72 mL/min   Anion gap 11 5 - 15  Brain natriuretic peptide  Result Value Ref Range   B Natriuretic Peptide 30.1 0.0 - 100.0 pg/mL  CBC with Differential  Result Value Ref Range   WBC 9.1 4.0 - 10.5 K/uL   RBC 3.96 (L) 4.22 - 5.81 MIL/uL   Hemoglobin 12.2 (L) 13.0 - 17.0  g/dL   HCT 53.6 (L) 64.4 - 03.4 %   MCV 94.4 80.0 - 100.0 fL   MCH 30.8 26.0 - 34.0 pg   MCHC 32.6 30.0 - 36.0 g/dL   RDW 74.2 59.5 - 63.8 %   Platelets 279 150 - 400 K/uL   nRBC 0.0 0.0 - 0.2 %   Neutrophils Relative % 61 %   Neutro Abs 5.6 1.7 - 7.7 K/uL   Lymphocytes Relative 22 %   Lymphs Abs 2.0 0.7 - 4.0 K/uL   Monocytes Relative 12 %   Monocytes Absolute 1.1 (H) 0.1 - 1.0 K/uL   Eosinophils Relative 3 %   Eosinophils Absolute 0.3 0.0 - 0.5 K/uL   Basophils Relative 1 %   Basophils Absolute 0.1 0.0 - 0.1 K/uL   Immature Granulocytes 1 %   Abs Immature Granulocytes 0.05 0.00 - 0.07 K/uL   CT Angio Chest PE W and/or Wo Contrast  Result Date: 11/03/2022 CLINICAL DATA:  Pulmonary embolism (PE) suspected, high prob. Shortness of breath. Decreasing O2 sats. COPD. History of lung cancer. EXAM: CT ANGIOGRAPHY CHEST WITH CONTRAST TECHNIQUE: Multidetector CT imaging of the chest was performed using the standard protocol during bolus administration of intravenous contrast. Multiplanar CT image reconstructions and MIPs were obtained to evaluate the vascular anatomy. RADIATION DOSE REDUCTION: This exam was performed according to the departmental dose-optimization program which includes  automated exposure control, adjustment of the mA and/or kV according to patient size and/or use of iterative reconstruction technique. CONTRAST:  OMNIPAQUE IOHEXOL 350 MG/ML SOLN COMPARISON:  10/29/2022 FINDINGS: Cardiovascular: Heart is normal size. Aorta is normal caliber. No filling defects in the pulmonary arteries to suggest pulmonary emboli. Moderate coronary artery and aortic calcifications. Mediastinum/Nodes: No mediastinal, hilar, or axillary adenopathy. Trachea and esophagus are unremarkable. Thyroid unremarkable. Lungs/Pleura: Continued consolidation in the lower lobes, unchanged. Moderate centrilobular and paraseptal emphysema. No effusions. Upper Abdomen: No acute findings Musculoskeletal: Chest wall  soft tissues are unremarkable. No acute bony abnormality. Review of the MIP images confirms the above findings. IMPRESSION: No evidence of pulmonary embolus. Continued consolidation in the lower lobes bilaterally concerning for pneumonia. Coronary artery disease. Aortic Atherosclerosis (ICD10-I70.0) and Emphysema (ICD10-J43.9). Electronically Signed   By: Charlett Nose M.D.   On: 11/03/2022 03:27   DG Chest Port 1 View  Result Date: 11/02/2022 CLINICAL DATA:  sob EXAM: PORTABLE CHEST 1 VIEW.  Patient is rotated COMPARISON:  CT chest 10/29/2022, chest x-ray 10/31/2022 FINDINGS: The heart and mediastinal contours are unchanged. Aortic calcification. Bibasilar airspace opacities. Chronic coarsened markings no pulmonary edema. At least small bilateral pleural effusion. No pneumothorax. No acute osseous abnormality. IMPRESSION: 1. Bibasilar airspace opacities. At least small bilateral pleural effusion. Followup PA and lateral chest X-ray is recommended in 3-4 weeks following therapy to ensure resolution and exclude underlying malignancy. 2. Aortic Atherosclerosis (ICD10-I70.0) and Emphysema (ICD10-J43.9). Electronically Signed   By: Tish Frederickson M.D.   On: 11/02/2022 22:43    Dione Booze, MD 11/03/22 [phone]

## 2022-11-02 NOTE — ED Provider Notes (Signed)
[place] EMERGENCY DEPARTMENT AT [facility] Provider Note   CSN: [id number] Arrival date & time: 11/02/22  2146     History  Chief Complaint  Patient presents with   Shortness of Breath    Mario [name] is a 72 y.o. male.  Pt is a 72 yo male with pmhx significant for COPD, HTN, bronchitis, depression, CAD, lung cancer, chronic resp failure (on 4L), and obesity.  Pt has been here frequently recently for sob.  The pt said his oxygen sat dropped to 86% today while at home and on his 4L.  He feels like he is not breathing well.  No fevers.        Home Medications Prior to Admission medications   Medication Sig Start Date End Date Taking? Authorizing Provider  albuterol (PROVENTIL) (2.5 MG/3ML) 0.083% nebulizer solution Inhale 3 mLs (2.5 mg total) by nebulization every 4 (four) hours as needed for wheezing or shortness of breath. 08/04/20 10/20/22  Rolly Salter, MD  albuterol (VENTOLIN HFA) 108 (90 Base) MCG/ACT inhaler Inhale 2 puffs into the lungs every 4 (four) hours as needed for wheezing or shortness of breath. 11/04/21   Glade Lloyd, MD  ascorbic acid (VITAMIN C) 500 MG tablet Take 500 mg by mouth daily.    [provider]  buPROPion (WELLBUTRIN XL) 300 MG 24 hr tablet Take 300 mg by mouth every morning. 02/05/20   [provider]  busPIRone (BUSPAR) 15 MG tablet Take 15 mg by mouth daily.    [provider]  Carboxymethylcellulose Sod PF 0.5 % SOLN Place 1 drop into both eyes 4 (four) times daily as needed (dry eyes).    [provider]  cholecalciferol (VITAMIN D3) 25 MCG (1000 UNIT) tablet Take 1,000 Units by mouth daily.    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    [provider]  cyclobenzaprine (FLEXERIL) 10 MG tablet Take 10 mg by mouth at bedtime.    [provider]  cycloSPORINE  (RESTASIS) 0.05 % ophthalmic emulsion Place 1 drop into both eyes every 12 (twelve) hours.    [provider]  diltiazem (CARDIZEM CD) 180 MG 24 hr capsule Take 180 mg by mouth daily.    [provider]  DULoxetine (CYMBALTA) 60 MG capsule Take 1 capsule (60 mg total) by mouth 2 (two) times daily. 10/04/21 10/20/22  Earney Navy, NP  DULoxetine (CYMBALTA) 60 MG capsule Take 60 mg by mouth 2 (two) times daily.    [provider]  fluticasone (FLONASE) 50 MCG/ACT nasal spray Place 2 sprays into both nostrils daily.     [provider]  folic acid (FOLVITE) 1 MG tablet Take 1 mg by mouth daily.    [provider]  gabapentin (NEURONTIN) 100 MG capsule Take 100 mg by mouth 3 (three) times daily.    [provider]  GARLIC PO Take 1 tablet by mouth daily.    [provider]  KRILL OIL PO Take 2 capsules by mouth daily.    [provider]  lurasidone (LATUDA) 40 MG TABS tablet Take 40 mg by mouth every evening. After supper    [provider]  Melatonin 3 MG CAPS Take 6 mg by mouth at bedtime.    [provider]  metFORMIN (GLUCOPHAGE) 850 MG tablet Take 850 mg by mouth every evening.    [provider]  Nutritional Supplements (ENSURE ORIGINAL) LIQD Take 1 Bottle by mouth daily as needed (in between meals).    [provider]  oxcarbazepine (TRILEPTAL) 600 MG tablet Take 600 mg by mouth in the morning, at noon, and at bedtime.    [provider]  oxyCODONE (OXY IR/ROXICODONE) 5 MG immediate release tablet Take 1-2 tablets (5-10 mg total) by mouth every 4 (four) hours as needed for severe pain. 09/10/22   Smitty Knudsen, PA-C  polyethylene glycol powder (GLYCOLAX/MIRALAX) 17 GM/SCOOP powder Take 17 g by mouth daily.    [provider]  tamsulosin (FLOMAX) 0.4 MG CAPS capsule Take 0.8 mg by mouth daily.    [provider]  Tiotropium Bromide-Olodaterol 2.5-2.5 MCG/ACT  AERS Inhale 2 each into the lungs every morning. 2 puffs    [provider]  traZODone (DESYREL) 50 MG tablet Take 75 mg by mouth at bedtime.    [provider]  vitamin B-12 (CYANOCOBALAMIN) 100 MCG tablet Take 100 mcg by mouth daily.    [provider]      Allergies    Demerol [meperidine], Zocor [simvastatin], Beet [beta vulgaris], Collard greens [wild lettuce extract (lactuca virosa)], and Liver    Review of Systems   Review of Systems  Respiratory:  Positive for shortness of breath.   All other systems reviewed and are negative.   Physical Exam Updated Vital Signs BP 112/70 (BP Location: Right Arm)   Pulse 93   Temp 97.7 F (36.5 C) (Oral)   SpO2 93%  Physical Exam Vitals and nursing note reviewed.  Constitutional:      Appearance: He is well-developed. He is obese.  HENT:     Head: Normocephalic and atraumatic.     Mouth/Throat:     Mouth: Mucous membranes are moist.     Pharynx: Oropharynx is clear.  Eyes:     Extraocular Movements: Extraocular movements intact.     Pupils: Pupils are equal, round, and reactive to light.  Cardiovascular:     Rate and Rhythm: Normal rate and regular rhythm.  Pulmonary:     Effort: Pulmonary effort is normal.     Breath sounds: Normal breath sounds.  Abdominal:     General: Bowel sounds are normal.     Palpations: Abdomen is soft.  Musculoskeletal:        General: Normal range of motion.     Cervical back: Normal range of motion and neck supple.  Skin:    General: Skin is warm.     Capillary Refill: Capillary refill takes less than 2 seconds.  Neurological:     General: No focal deficit present.     Mental Status: He is alert and oriented to person, place, and time.  Psychiatric:        Mood and Affect: Mood normal.        Behavior: Behavior normal.     ED Results / Procedures / Treatments   Labs (all labs ordered are listed, but only abnormal results are displayed) Labs Reviewed  BASIC  METABOLIC PANEL  BRAIN NATRIURETIC PEPTIDE  CBC WITH DIFFERENTIAL/PLATELET    EKG EKG Interpretation Date/Time:  Wednesday November 02 2022 22:00:48 EDT Ventricular Rate:  94 PR Interval:  180 QRS Duration:  120 QT Interval:  356 QTC Calculation: 446 R Axis:   -31  Text Interpretation: Sinus rhythm Nonspecific intraventricular conduction delay No significant change since last tracing Confirmed by Jacalyn Lefevre [phone]) on 11/02/2022 11:25:48 PM  Radiology DG Chest  Port 1 View  Result Date: 11/02/2022 CLINICAL DATA:  sob EXAM: PORTABLE CHEST 1 VIEW.  Patient is rotated COMPARISON:  CT chest 10/29/2022, chest x-ray 10/31/2022 FINDINGS: The heart and mediastinal contours are unchanged. Aortic calcification. Bibasilar airspace opacities. Chronic coarsened markings no pulmonary edema. At least small bilateral pleural effusion. No pneumothorax. No acute osseous abnormality. IMPRESSION: 1. Bibasilar airspace opacities. At least small bilateral pleural effusion. Followup PA and lateral chest X-ray is recommended in 3-4 weeks following therapy to ensure resolution and exclude underlying malignancy. 2. Aortic Atherosclerosis (ICD10-I70.0) and Emphysema (ICD10-J43.9). Electronically Signed   By: Tish Frederickson M.D.   On: 11/02/2022 22:43    Procedures Procedures    Medications Ordered in ED Medications  ipratropium-albuterol (DUONEB) 0.5-2.5 (3) MG/3ML nebulizer solution 3 mL (3 mLs Nebulization Given 11/02/22 2254)  oxyCODONE (Oxy IR/ROXICODONE) immediate release tablet 5 mg (5 mg Oral Given 11/02/22 2254)    ED Course/ Medical Decision Making/ A&P                                 Medical Decision Making Amount and/or Complexity of Data Reviewed Labs: ordered. Radiology: ordered.  Risk Prescription drug management.   This patient presents to the ED for concern of sob, this involves an extensive number of treatment options, and is a complaint that carries with it a high risk of  complications and morbidity.  The differential diagnosis includes copd, pna, chf   Co morbidities that complicate the patient evaluation  COPD, HTN, bronchitis, depression, CAD, lung cancer, chronic resp failure (on 4L), and obesity   Additional history obtained:  Additional history obtained from epic chart review External records from outside source obtained and reviewed including EMS report   Lab Tests:  I Ordered, and personally interpreted labs.  Labs are pending at shift change.   Imaging Studies ordered:  I ordered imaging studies including CXR and CT chest  I independently visualized and interpreted imaging which showed  CXR:  Bibasilar airspace opacities. At least small bilateral pleural  effusion. Followup PA and lateral chest X-ray is recommended in 3-4  weeks following therapy to ensure resolution and exclude underlying  malignancy.  2. Aortic Atherosclerosis (ICD10-I70.0) and Emphysema (ICD10-J43.9).   I agree with the radiologist interpretation   Cardiac Monitoring:  The patient was maintained on a cardiac monitor.  I personally viewed and interpreted the cardiac monitored which showed an underlying rhythm of: nsr   Medicines ordered and prescription drug management:  I ordered medication including duoneb  for sx  Reevaluation of the patient after these medicines showed that the patient improved I have reviewed the patients home medicines and have made adjustments as needed   Test Considered:  ct   Critical Interventions:  nebs   Problem List / ED Course:  BLE opacities:  CT chest ordered for further eval, but these have been seen since 8/21.   Reevaluation:  After the interventions noted above, I reevaluated the patient and found that they have :improved   Social Determinants of Health:  Lives at home         Final Clinical Impression(s) / ED Diagnoses Final diagnoses:  COPD exacerbation (HCC)    Rx / DC Orders ED  Discharge Orders     None         Jacalyn Lefevre, MD 11/02/22 2330

## 2022-11-03 ENCOUNTER — Emergency Department (HOSPITAL_COMMUNITY): Payer: No Typology Code available for payment source

## 2022-11-03 ENCOUNTER — Encounter (HOSPITAL_COMMUNITY): Payer: Self-pay

## 2022-11-03 LAB — BASIC METABOLIC PANEL
Anion gap: 11 (ref 5–15)
BUN: 16 mg/dL (ref 8–23)
CO2: 24 mmol/L (ref 22–32)
Calcium: 8.9 mg/dL (ref 8.9–10.3)
Chloride: 100 mmol/L (ref 98–111)
Creatinine, Ser: 1.06 mg/dL (ref 0.61–1.24)
GFR, Estimated: >60 mL/min (ref >60)
Glucose, Bld: 139 mg/dL — ABNORMAL HIGH (ref 70–99)
Potassium: 3.8 mmol/L (ref 3.5–5.1)
Sodium: 135 mmol/L (ref 135–145)

## 2022-11-03 LAB — CBC WITH DIFFERENTIAL/PLATELET
Abs Immature Granulocytes: 0.05 10*3/uL (ref 0.00–0.07)
Basophils Absolute: 0.1 10*3/uL (ref 0.0–0.1)
Basophils Relative: 1 %
Eosinophils Absolute: 0.3 10*3/uL (ref 0.0–0.5)
Eosinophils Relative: 3 %
HCT: 37.4 % — ABNORMAL LOW (ref 39.0–52.0)
Hemoglobin: 12.2 g/dL — ABNORMAL LOW (ref 13.0–17.0)
Immature Granulocytes: 1 %
Lymphocytes Relative: 22 %
Lymphs Abs: 2 10*3/uL (ref 0.7–4.0)
MCH: 30.8 pg (ref 26.0–34.0)
MCHC: 32.6 g/dL (ref 30.0–36.0)
MCV: 94.4 fL (ref 80.0–100.0)
Monocytes Absolute: 1.1 10*3/uL — ABNORMAL HIGH (ref 0.1–1.0)
Monocytes Relative: 12 %
Neutro Abs: 5.6 10*3/uL (ref 1.7–7.7)
Neutrophils Relative %: 61 %
Platelets: 279 10*3/uL (ref 150–400)
RBC: 3.96 MIL/uL — ABNORMAL LOW (ref 4.22–5.81)
RDW: 14.4 % (ref 11.5–15.5)
WBC: 9.1 10*3/uL (ref 4.0–10.5)
nRBC: 0 % (ref 0.0–0.2)

## 2022-11-03 LAB — BRAIN NATRIURETIC PEPTIDE: B Natriuretic Peptide: 30.1 pg/mL (ref 0.0–100.0)

## 2022-11-03 MED ORDER — IOHEXOL 350 MG/ML SOLN
100.0000 mL | Freq: Once | INTRAVENOUS | Status: AC | PRN
  Administered 2022-11-03: 100 mL via INTRAVENOUS

## 2022-11-03 MED ORDER — SODIUM CHLORIDE (PF) 0.9 % IJ SOLN
INTRAMUSCULAR | Status: AC
  Filled 2022-11-03: qty 50

## 2022-11-03 NOTE — Discharge Instructions (Signed)
Continue using your oxygen concentrator, but discuss with your primary care provider whether you would benefit from an oxygen tank which could give you more oxygen than the concentrator can.

## 2022-11-03 NOTE — ED Notes (Signed)
Pt returned from CT °

## 2022-11-03 NOTE — ED Notes (Signed)
Pt is waiting on his ride in room, due to being on 02

## 2022-11-03 NOTE — ED Notes (Signed)
Attempted to ambulate this pt. Pt stated that he was feeling light headed and could not comfortably ambulate at this time.

## 2023-01-26 ENCOUNTER — Encounter (HOSPITAL_COMMUNITY): Payer: Self-pay

## 2023-01-26 ENCOUNTER — Other Ambulatory Visit: Payer: Self-pay

## 2023-01-26 ENCOUNTER — Emergency Department (HOSPITAL_COMMUNITY): Payer: Medicare PPO

## 2023-01-26 ENCOUNTER — Emergency Department (HOSPITAL_COMMUNITY)
Admission: EM | Admit: 2023-01-26 | Discharge: 2023-01-27 | Disposition: A | Discharge status: Home / Self Care | Payer: Medicare PPO | Attending: Emergency Medicine | Admitting: Emergency Medicine

## 2023-01-26 DIAGNOSIS — K5900 Constipation, unspecified: Secondary | ICD-10-CM

## 2023-01-26 DIAGNOSIS — Z7984 Long term (current) use of oral hypoglycemic drugs: Secondary | ICD-10-CM | POA: Insufficient documentation

## 2023-01-26 DIAGNOSIS — K219 Gastro-esophageal reflux disease without esophagitis: Secondary | ICD-10-CM

## 2023-01-26 LAB — COMPREHENSIVE METABOLIC PANEL
ALT: 12 U/L (ref 0–44)
AST: 16 U/L (ref 15–41)
Albumin: 3.7 g/dL (ref 3.5–5.0)
Alkaline Phosphatase: 83 U/L (ref 38–126)
Anion gap: 10 (ref 5–15)
BUN: 15 mg/dL (ref 8–23)
CO2: 24 mmol/L (ref 22–32)
Calcium: 8.8 mg/dL — ABNORMAL LOW (ref 8.9–10.3)
Chloride: 100 mmol/L (ref 98–111)
Creatinine, Ser: 0.98 mg/dL (ref 0.61–1.24)
GFR, Estimated: >60 mL/min (ref >60)
Glucose, Bld: 102 mg/dL — ABNORMAL HIGH (ref 70–99)
Potassium: 4.4 mmol/L (ref 3.5–5.1)
Sodium: 134 mmol/L — ABNORMAL LOW (ref 135–145)
Total Bilirubin: 0.4 mg/dL (ref <1.2)
Total Protein: 7.1 g/dL (ref 6.5–8.1)

## 2023-01-26 LAB — CBC WITH DIFFERENTIAL/PLATELET
Abs Immature Granulocytes: 0.03 10*3/uL (ref 0.00–0.07)
Basophils Absolute: 0 10*3/uL (ref 0.0–0.1)
Basophils Relative: 0 %
Eosinophils Absolute: 0 10*3/uL (ref 0.0–0.5)
Eosinophils Relative: 0 %
HCT: 39.4 % (ref 39.0–52.0)
Hemoglobin: 12.9 g/dL — ABNORMAL LOW (ref 13.0–17.0)
Immature Granulocytes: 0 %
Lymphocytes Relative: 11 %
Lymphs Abs: 1.1 10*3/uL (ref 0.7–4.0)
MCH: 31.2 pg (ref 26.0–34.0)
MCHC: 32.7 g/dL (ref 30.0–36.0)
MCV: 95.4 fL (ref 80.0–100.0)
Monocytes Absolute: 0.8 10*3/uL (ref 0.1–1.0)
Monocytes Relative: 8 %
Neutro Abs: 8 10*3/uL — ABNORMAL HIGH (ref 1.7–7.7)
Neutrophils Relative %: 81 %
Platelets: 337 10*3/uL (ref 150–400)
RBC: 4.13 MIL/uL — ABNORMAL LOW (ref 4.22–5.81)
RDW: 12.8 % (ref 11.5–15.5)
WBC: 10.1 10*3/uL (ref 4.0–10.5)
nRBC: 0 % (ref 0.0–0.2)

## 2023-01-26 LAB — LIPASE, BLOOD: Lipase: 39 U/L (ref 11–51)

## 2023-01-26 LAB — TROPONIN I (HIGH SENSITIVITY): Troponin I (High Sensitivity): 5 ng/L (ref <18)

## 2023-01-26 MED ORDER — BISACODYL 5 MG PO TBEC: 10.0000 mg | DELAYED_RELEASE_TABLET | Freq: Every day | ORAL | 0 refills | Status: DC | PRN

## 2023-01-26 MED ORDER — OMEPRAZOLE 20 MG PO CPDR: 20.0000 mg | DELAYED_RELEASE_CAPSULE | Freq: Every day | ORAL | 0 refills | Status: DC

## 2023-01-26 NOTE — ED Provider Notes (Addendum)
[place] EMERGENCY DEPARTMENT AT [facility] Provider Note   CSN: [id number] Arrival date & time: 01/26/23  2020     History  Chief Complaint  Patient presents with   Constipation    Mario [name] is a 72 y.o. male.  HPI Patient has had several symptoms.  He reports he had heartburn at about 1 PM today.  He reports he ate a bowl of cereal and a banana.  He reports for about an hour he had a burning uncomfortable sensation in the center of his chest.  He reports is gone now.  Also reports that after sitting for a while he felt like his legs were weak.  He reports it is kind of a general achy feeling in his legs.  He has not noted significant swelling.  He reports he is urinating quite a bit.  No pain with urination.  Patient reports that he thinks he is constipated.  He has not had a bowel movement for about 6 days.  He denies he has any persistent pain but notes that he does get more abdominal discomfort when he coughs. Some discomfort comes and goes.  He reports having tried MiraLAX but still not having a bowel movement.  Patient has chronic hypoxic respiratory failure.  No immediate complaints regarding this.    Home Medications Prior to Admission medications   Medication Sig Start Date End Date Taking? Authorizing Provider  albuterol (PROVENTIL) (2.5 MG/3ML) 0.083% nebulizer solution Inhale 3 mLs (2.5 mg total) by nebulization every 4 (four) hours as needed for wheezing or shortness of breath. 08/04/20 11/03/22  Rolly Salter, MD  albuterol (VENTOLIN HFA) 108 (90 Base) MCG/ACT inhaler Inhale 2 puffs into the lungs every 4 (four) hours as needed for wheezing or shortness of breath. 11/04/21   Glade Lloyd, MD  ascorbic acid (VITAMIN C) 500 MG tablet Take 500 mg by mouth daily.    [provider]  bisacodyl (DULCOLAX) 5 MG EC tablet Take 2 tablets (10 mg total) by mouth daily as needed for moderate constipation. 01/26/23   Arby Barrette, MD  buPROPion  (WELLBUTRIN XL) 300 MG 24 hr tablet Take 300 mg by mouth every morning. 02/05/20   [provider]  busPIRone (BUSPAR) 15 MG tablet Take 15 mg by mouth daily.    [provider]  Carboxymethylcellulose Sod PF 0.5 % SOLN Place 1 drop into both eyes 4 (four) times daily as needed (dry eyes).    [provider]  cholecalciferol (VITAMIN D3) 25 MCG (1000 UNIT) tablet Take 1,000 Units by mouth daily.    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    [provider]  cyclobenzaprine (FLEXERIL) 10 MG tablet Take 10 mg by mouth at bedtime.    [provider]  cycloSPORINE (RESTASIS) 0.05 % ophthalmic emulsion Place 1 drop into both eyes every 12 (twelve) hours.    [provider]  diltiazem (CARDIZEM CD) 180 MG 24 hr capsule Take 180 mg by mouth daily.    [provider]  DULoxetine (CYMBALTA) 60 MG capsule Take 60 mg by mouth 2 (two) times daily.    [provider]  fluticasone (FLONASE) 50 MCG/ACT nasal spray Place 2 sprays into both nostrils daily.     [provider]  folic acid (FOLVITE) 1 MG tablet Take 1 mg by mouth daily. Patient not taking: Reported on 11/03/2022  [provider]  gabapentin (NEURONTIN) 100 MG capsule Take 100 mg by mouth 3 (three) times daily.    [provider]  GARLIC PO Take 1 tablet by mouth daily.    [provider]  KRILL OIL PO Take 2 capsules by mouth daily.    [provider]  lurasidone (LATUDA) 40 MG TABS tablet Take 40 mg by mouth every evening. After supper    [provider]  Melatonin 3 MG CAPS Take 6 mg by mouth at bedtime.    [provider]  metFORMIN (GLUCOPHAGE) 850 MG tablet Take 850 mg by mouth every evening.    [provider]  Nutritional Supplements (ENSURE ORIGINAL) LIQD Take 1 Bottle by mouth daily as  needed (in between meals).    [provider]  omeprazole (PRILOSEC) 20 MG capsule Take 1 capsule (20 mg total) by mouth daily. 01/26/23   Arby Barrette, MD  oxcarbazepine (TRILEPTAL) 600 MG tablet Take 600 mg by mouth in the morning, at noon, and at bedtime.    [provider]  oxyCODONE (OXY IR/ROXICODONE) 5 MG immediate release tablet Take 1-2 tablets (5-10 mg total) by mouth every 4 (four) hours as needed for severe pain. 09/10/22   Smitty Knudsen, PA-C  polyethylene glycol powder (GLYCOLAX/MIRALAX) 17 GM/SCOOP powder Take 17 g by mouth daily.    [provider]  tamsulosin (FLOMAX) 0.4 MG CAPS capsule Take 0.8 mg by mouth daily.    [provider]  Tiotropium Bromide-Olodaterol 2.5-2.5 MCG/ACT AERS Inhale 2 each into the lungs every morning. 2 puffs    [provider]  traZODone (DESYREL) 50 MG tablet Take 75 mg by mouth at bedtime.    [provider]  vitamin B-12 (CYANOCOBALAMIN) 100 MCG tablet Take 100 mcg by mouth daily.    [provider]      Allergies    Demerol [meperidine], Zocor [simvastatin], Beet [beta vulgaris], Collard greens [wild lettuce extract (lactuca virosa)], and Liver    Review of Systems   Review of Systems  Physical Exam Updated Vital Signs BP (!) 146/87 (BP Location: Right Arm)   Pulse 98   Temp 98 F (36.7 C) (Oral)   Resp 18   Ht 6\' 2"  (1.88 m)   Wt 127 kg   SpO2 94%   BMI 35.95 kg/m  Physical Exam Constitutional:      Comments: Alert nontoxic.  No acute respiratory distress.  He is wearing oxygen.  Mental status clear.  HENT:     Mouth/Throat:     Pharynx: Oropharynx is clear.  Eyes:     Extraocular Movements: Extraocular movements intact.  Cardiovascular:     Rate and Rhythm: Normal rate and regular rhythm.  Pulmonary:     Comments: Lungs are grossly clear.  Occasional wheeze.  Adequate airflow to the lower lung fields. Abdominal:     Comments: Patient has protuberant abdomen  with significant central obesity.  Abdomen is soft without guarding.  Patient endorses some discomfort somewhat centrally into the left lateral abdomen.  Musculoskeletal:     Comments: No significant peripheral edema.  Calves are soft and pliable.  Skin:    General: Skin is warm and dry.  Neurological:     General: No focal deficit present.     Mental Status: He is oriented to person, place, and time.     Motor: No weakness.     Coordination: Coordination normal.     ED Results / Procedures / Treatments  Labs (all labs ordered are listed, but only abnormal results are displayed) Labs Reviewed  COMPREHENSIVE METABOLIC PANEL - Abnormal; Notable for the following components:      Result Value   Sodium 134 (*)    Glucose, Bld 102 (*)    Calcium 8.8 (*)    All other components within normal limits  CBC WITH DIFFERENTIAL/PLATELET - Abnormal; Notable for the following components:   RBC 4.13 (*)    Hemoglobin 12.9 (*)    Neutro Abs 8.0 (*)    All other components within normal limits  LIPASE, BLOOD  URINALYSIS, ROUTINE W REFLEX MICROSCOPIC  TROPONIN I (HIGH SENSITIVITY)  TROPONIN I (HIGH SENSITIVITY)    EKG EKG Interpretation Date/Time:  Thursday January 26 2023 23:52:54 EST Ventricular Rate:  101 PR Interval:  222 QRS Duration:  122 QT Interval:  353 QTC Calculation: 458 R Axis:   -34  Text Interpretation: Sinus tachycardia Prolonged PR interval Left bundle branch block no sig change from previous, no acute ischemic appearance Confirmed by Arby Barrette [phone]) on 01/26/2023 11:57:14 PM  Radiology CT ABDOMEN PELVIS WO CONTRAST  Result Date: 01/26/2023 CLINICAL DATA:  Acute nonlocalized abdominal pain EXAM: CT ABDOMEN AND PELVIS WITHOUT CONTRAST TECHNIQUE: Multidetector CT imaging of the abdomen and pelvis was performed following the standard protocol without IV contrast. RADIATION DOSE REDUCTION: This exam was performed according to the departmental dose-optimization  program which includes automated exposure control, adjustment of the mA and/or kV according to patient size and/or use of iterative reconstruction technique. COMPARISON:  CT chest 11/03/2022 and 10/08/2022, CT abdomen pelvis 09/24/2022 FINDINGS: Lower chest: Bibasilar pulmonary consolidation is again identified within the visualized lung bases demonstrating a identical pattern and distribution of consolidation when compared to both prior CT examinations of the chest. The findings would favor a chronic consolidative process, such as cryptogenic organizing pneumonia, chronic eosinophilic pneumonia, or post inflammatory fibrosis. Inflammatory conditions such as recurrent aspiration, drug reaction, vasculitis, or malignancy such as bronchoalveolar carcinoma, are considered less likely given the stability over time. Extensive multi-vessel coronary artery calcification. Cardiac size within normal limits. Hepatobiliary: No focal liver abnormality is seen. No gallstones, gallbladder wall thickening, or biliary dilatation. Pancreas: Unremarkable Spleen: Unremarkable Adrenals/Urinary Tract: The adrenal glands are unremarkable. The kidneys are normal in size and position. Exophytic lesions are seen arising from the interpolar region of the kidneys bilaterally, unchanged from remote prior examination of 09/13/2021 and compatible with mildly hyperdense cortical cyst. No follow-up imaging is recommended. The kidneys are otherwise unremarkable. Bladder unremarkable. Stomach/Bowel: Large colonic stool burden without evidence of obstruction. Extensive descending and sigmoid colon diverticulosis without superimposed focal inflammatory change. Redundant sigmoid colon noted. The stomach, small bowel, and large bowel are otherwise unremarkable. The appendix is absent. No free intraperitoneal gas or fluid. Vascular/Lymphatic: Aortic atherosclerosis. No enlarged abdominal or pelvic lymph nodes. Reproductive: Mild prostatic hypertrophy  Other: Tiny fat containing umbilical and left inguinal hernias. Musculoskeletal: Degenerative changes seen within the thoraco lumbar spine. No acute bone abnormality. No lytic or blastic bone lesion. IMPRESSION: 1. No acute intra-abdominal pathology identified. No definite radiographic explanation for the patient's reported symptoms. 2. Large colonic stool burden without evidence of obstruction. 3. Extensive descending and sigmoid colon diverticulosis without superimposed focal inflammatory change. 4. Extensive multi-vessel coronary artery calcification. 5. Stable bibasilar pulmonary consolidation demonstrating a identical pattern and distribution of consolidation when compared to both prior CT examinations of the chest. The findings would favor a chronic consolidative process, such as cryptogenic organizing pneumonia, chronic eosinophilic  pneumonia, or post inflammatory fibrosis. Inflammatory conditions such as recurrent aspiration, drug reaction, vasculitis, or malignancy such as bronchoalveolar carcinoma, are considered less likely given the stability over time. Aortic Atherosclerosis (ICD10-I70.0). Electronically Signed   By: Helyn Numbers M.D.   On: 01/26/2023 23:24   DG Abdomen Acute W/Chest  Result Date: 01/26/2023 CLINICAL DATA:  abdo pain EXAM: DG ABDOMEN ACUTE WITH 1 VIEW CHEST COMPARISON:  CT chest 11/03/2022, chest x-ray 11/02/2022. FINDINGS: The heart and mediastinal contours are unchanged. Persistent bilateral lower lung zone patchy airspace opacities. No pulmonary edema. Likely bilateral at least trace pleural effusions. No pneumothorax. Limited evaluation due to overlapping osseous structures and overlying soft tissues. There is no evidence of dilated bowel loops or free intraperitoneal air. No radiopaque calculi or other significant radiographic abnormality is seen. No acute osseous abnormality. IMPRESSION: 1. Persist bilateral lower lung zone patchy airspace opacities. 2. Likely bilateral at  least trace pleural effusions. 3. Nonobstructive bowel gas pattern. Electronically Signed   By: Tish Frederickson M.D.   On: 01/26/2023 22:12    Procedures Procedures    Medications Ordered in ED Medications - No data to display  ED Course/ Medical Decision Making/ A&P                                 Medical Decision Making Amount and/or Complexity of Data Reviewed Labs: ordered. Radiology: ordered.  Risk OTC drugs. Prescription drug management.   Patient presents with several different complaints today.  Firstly he notes heartburn that occurred about 1 PM and lasted about an hour.  Differential diagnosis includes GERD\ACS.  Will proceed with EKG troponin and labs for abdominal pain.  Patient reports constipation although does not seem to have any ongoing abdominal pain.  At this time we will obtain plain film x-rays and lab work as well as urinalysis.  Patient's abdominal examination is nonsurgical.  Patient also reported feeling some weakness in both legs after sitting for a while.  At this time no focal deficits no strokelike symptoms.  Will proceed with blood counts and electrolyte panel and reassess.  Lipase 39.  Metabolic panel within normal limits except calcium 8.8.  Normal GFR and LFTs.  White count 10.1 H&H 12.9 and 39 platelets 337.  I have personally reviewed the CT scan and CT scan interpreted by radiology high stool burden but no acute findings.  Chronic lower lung consolidations stable.  At this time troponin is normal.  I suspect patient had an episode of GERD earlier today.  He does describe taking Protonix intermittently.  Counseled on daily use for about the next 2 weeks and then reassessment.  I have included discharge instructions GERD food management.  Patient reports constipation but is not having persistent pain.  CT scan was obtained and confirmed extensive stool but no obstruction and no other acute findings.  I extensively reviewed the plan of ongoing treatment  with baseline stool softener, MiraLAX and as needed Dulcolax.  Patient will continue this regimen and review the dietary instructions.  At this time stable for discharge.         Final Clinical Impression(s) / ED Diagnoses Final diagnoses:  Gastroesophageal reflux disease, unspecified whether esophagitis present  Constipation, unspecified constipation type    Rx / DC Orders ED Discharge Orders          Ordered    bisacodyl (DULCOLAX) 5 MG EC tablet  Daily PRN,   Status:  Discontinued        01/26/23 2329    omeprazole (PRILOSEC) 20 MG capsule  Daily,   Status:  Discontinued        01/26/23 2331    bisacodyl (DULCOLAX) 5 MG EC tablet  Daily PRN        01/26/23 2336    omeprazole (PRILOSEC) 20 MG capsule  Daily        01/26/23 2336              Arby Barrette, MD 01/26/23 1610    Arby Barrette, MD 01/26/23 2357

## 2023-01-26 NOTE — ED Notes (Signed)
[facility] was contacted for pt transport

## 2023-01-26 NOTE — Discharge Instructions (Addendum)
1.  Take Colace twice daily.  Continue taking this as a stool softener.  Take MiraLAX daily.  Take 1-2 Dulcolax tablets daily.  Continue this until you are having regular bowel movements. 2.  Follow-up with your doctor soon as possible for recheck. 2.  You need to be treated for reflux.  Take omeprazole daily.  Take this in the morning before you eat anything.

## 2023-01-26 NOTE — ED Triage Notes (Signed)
Patient arrives via EMS for complaints of constipation for 6 days. States he has tried Miralax and stool softeners with no relief. States he has been passing gas. Patienr reports having heart burn earlier today and taking OTC medications for it. No heartburn at this time.

## 2023-03-17 ENCOUNTER — Inpatient Hospital Stay (HOSPITAL_COMMUNITY)
Admission: EM | Admit: 2023-03-17 | Discharge: 2023-04-01 | DRG: 193 | Disposition: A | Discharge status: Home / Self Care | Payer: No Typology Code available for payment source | Attending: Internal Medicine | Admitting: Internal Medicine

## 2023-03-17 ENCOUNTER — Emergency Department (HOSPITAL_COMMUNITY): Payer: No Typology Code available for payment source

## 2023-03-17 ENCOUNTER — Other Ambulatory Visit: Payer: Self-pay

## 2023-03-17 ENCOUNTER — Encounter (HOSPITAL_COMMUNITY): Payer: Self-pay | Admitting: Emergency Medicine

## 2023-03-17 DIAGNOSIS — J121 Respiratory syncytial virus pneumonia: Secondary | ICD-10-CM | POA: Diagnosis present

## 2023-03-17 DIAGNOSIS — E871 Hypo-osmolality and hyponatremia: Secondary | ICD-10-CM | POA: Diagnosis present

## 2023-03-17 DIAGNOSIS — R079 Chest pain, unspecified: Secondary | ICD-10-CM | POA: Diagnosis not present

## 2023-03-17 DIAGNOSIS — Z9109 Other allergy status, other than to drugs and biological substances: Secondary | ICD-10-CM

## 2023-03-17 DIAGNOSIS — Z87891 Personal history of nicotine dependence: Secondary | ICD-10-CM | POA: Diagnosis not present

## 2023-03-17 DIAGNOSIS — Z7984 Long term (current) use of oral hypoglycemic drugs: Secondary | ICD-10-CM

## 2023-03-17 DIAGNOSIS — J209 Acute bronchitis, unspecified: Secondary | ICD-10-CM | POA: Diagnosis not present

## 2023-03-17 DIAGNOSIS — Z85118 Personal history of other malignant neoplasm of bronchus and lung: Secondary | ICD-10-CM

## 2023-03-17 DIAGNOSIS — Z1152 Encounter for screening for COVID-19: Secondary | ICD-10-CM | POA: Diagnosis not present

## 2023-03-17 DIAGNOSIS — J9621 Acute and chronic respiratory failure with hypoxia: Secondary | ICD-10-CM | POA: Diagnosis present

## 2023-03-17 DIAGNOSIS — E119 Type 2 diabetes mellitus without complications: Secondary | ICD-10-CM | POA: Diagnosis present

## 2023-03-17 DIAGNOSIS — Z6841 Body Mass Index (BMI) 40.0 and over, adult: Secondary | ICD-10-CM

## 2023-03-17 DIAGNOSIS — J189 Pneumonia, unspecified organism: Secondary | ICD-10-CM

## 2023-03-17 DIAGNOSIS — Z888 Allergy status to other drugs, medicaments and biological substances status: Secondary | ICD-10-CM

## 2023-03-17 DIAGNOSIS — J9612 Chronic respiratory failure with hypercapnia: Secondary | ICD-10-CM | POA: Diagnosis present

## 2023-03-17 DIAGNOSIS — M456 Ankylosing spondylitis lumbar region: Secondary | ICD-10-CM | POA: Diagnosis present

## 2023-03-17 DIAGNOSIS — Z9221 Personal history of antineoplastic chemotherapy: Secondary | ICD-10-CM

## 2023-03-17 DIAGNOSIS — Z7982 Long term (current) use of aspirin: Secondary | ICD-10-CM | POA: Diagnosis not present

## 2023-03-17 DIAGNOSIS — J441 Chronic obstructive pulmonary disease with (acute) exacerbation: Secondary | ICD-10-CM | POA: Diagnosis present

## 2023-03-17 DIAGNOSIS — E875 Hyperkalemia: Secondary | ICD-10-CM | POA: Diagnosis present

## 2023-03-17 DIAGNOSIS — J205 Acute bronchitis due to respiratory syncytial virus: Secondary | ICD-10-CM | POA: Diagnosis present

## 2023-03-17 DIAGNOSIS — J449 Chronic obstructive pulmonary disease, unspecified: Secondary | ICD-10-CM | POA: Diagnosis not present

## 2023-03-17 DIAGNOSIS — T501X5A Adverse effect of loop [high-ceiling] diuretics, initial encounter: Secondary | ICD-10-CM | POA: Diagnosis present

## 2023-03-17 DIAGNOSIS — Z7901 Long term (current) use of anticoagulants: Secondary | ICD-10-CM | POA: Diagnosis not present

## 2023-03-17 DIAGNOSIS — F411 Generalized anxiety disorder: Secondary | ICD-10-CM | POA: Diagnosis present

## 2023-03-17 DIAGNOSIS — I252 Old myocardial infarction: Secondary | ICD-10-CM | POA: Diagnosis not present

## 2023-03-17 DIAGNOSIS — R042 Hemoptysis: Secondary | ICD-10-CM | POA: Diagnosis not present

## 2023-03-17 DIAGNOSIS — R000 Tachycardia, unspecified: Secondary | ICD-10-CM | POA: Diagnosis present

## 2023-03-17 DIAGNOSIS — I10 Essential (primary) hypertension: Secondary | ICD-10-CM | POA: Diagnosis present

## 2023-03-17 DIAGNOSIS — G4700 Insomnia, unspecified: Secondary | ICD-10-CM | POA: Diagnosis present

## 2023-03-17 DIAGNOSIS — I2510 Atherosclerotic heart disease of native coronary artery without angina pectoris: Secondary | ICD-10-CM | POA: Diagnosis present

## 2023-03-17 DIAGNOSIS — Z23 Encounter for immunization: Secondary | ICD-10-CM | POA: Diagnosis not present

## 2023-03-17 DIAGNOSIS — Z85828 Personal history of other malignant neoplasm of skin: Secondary | ICD-10-CM

## 2023-03-17 DIAGNOSIS — G4733 Obstructive sleep apnea (adult) (pediatric): Secondary | ICD-10-CM | POA: Diagnosis present

## 2023-03-17 DIAGNOSIS — G8929 Other chronic pain: Secondary | ICD-10-CM | POA: Diagnosis present

## 2023-03-17 DIAGNOSIS — J440 Chronic obstructive pulmonary disease with acute lower respiratory infection: Secondary | ICD-10-CM | POA: Diagnosis present

## 2023-03-17 DIAGNOSIS — J9601 Acute respiratory failure with hypoxia: Principal | ICD-10-CM | POA: Diagnosis present

## 2023-03-17 DIAGNOSIS — J439 Emphysema, unspecified: Secondary | ICD-10-CM | POA: Diagnosis present

## 2023-03-17 DIAGNOSIS — Z86711 Personal history of pulmonary embolism: Secondary | ICD-10-CM | POA: Diagnosis not present

## 2023-03-17 DIAGNOSIS — F32A Depression, unspecified: Secondary | ICD-10-CM | POA: Diagnosis present

## 2023-03-17 DIAGNOSIS — J9611 Chronic respiratory failure with hypoxia: Secondary | ICD-10-CM | POA: Diagnosis not present

## 2023-03-17 DIAGNOSIS — Z79899 Other long term (current) drug therapy: Secondary | ICD-10-CM

## 2023-03-17 DIAGNOSIS — K5900 Constipation, unspecified: Secondary | ICD-10-CM | POA: Diagnosis present

## 2023-03-17 DIAGNOSIS — Z923 Personal history of irradiation: Secondary | ICD-10-CM

## 2023-03-17 DIAGNOSIS — N400 Enlarged prostate without lower urinary tract symptoms: Secondary | ICD-10-CM | POA: Diagnosis present

## 2023-03-17 DIAGNOSIS — J159 Unspecified bacterial pneumonia: Secondary | ICD-10-CM | POA: Diagnosis present

## 2023-03-17 DIAGNOSIS — Z86718 Personal history of other venous thrombosis and embolism: Secondary | ICD-10-CM

## 2023-03-17 DIAGNOSIS — Z9981 Dependence on supplemental oxygen: Secondary | ICD-10-CM

## 2023-03-17 DIAGNOSIS — E66813 Obesity, class 3: Secondary | ICD-10-CM | POA: Diagnosis present

## 2023-03-17 LAB — STREP PNEUMONIAE URINARY ANTIGEN: Strep Pneumo Urinary Antigen: NEGATIVE

## 2023-03-17 LAB — BASIC METABOLIC PANEL WITH GFR
Anion gap: 11 (ref 5–15)
BUN: 18 mg/dL (ref 8–23)
CO2: 23 mmol/L (ref 22–32)
Calcium: 8.9 mg/dL (ref 8.9–10.3)
Chloride: 100 mmol/L (ref 98–111)
Creatinine, Ser: 1.14 mg/dL (ref 0.61–1.24)
GFR, Estimated: >60 mL/min
Glucose, Bld: 229 mg/dL — ABNORMAL HIGH (ref 70–99)
Potassium: 5.2 mmol/L — ABNORMAL HIGH (ref 3.5–5.1)
Sodium: 134 mmol/L — ABNORMAL LOW (ref 135–145)

## 2023-03-17 LAB — GLUCOSE, CAPILLARY
Glucose-Capillary: 244 mg/dL — ABNORMAL HIGH (ref 70–99)
Glucose-Capillary: 246 mg/dL — ABNORMAL HIGH (ref 70–99)
Glucose-Capillary: 216 mg/dL — ABNORMAL HIGH (ref 70–99)

## 2023-03-17 LAB — CBC WITH DIFFERENTIAL/PLATELET
Abs Immature Granulocytes: 0.06 10*3/uL (ref 0.00–0.07)
Basophils Absolute: 0.1 10*3/uL (ref 0.0–0.1)
Basophils Relative: 0 %
Eosinophils Absolute: 0.1 10*3/uL (ref 0.0–0.5)
Eosinophils Relative: 0 %
HCT: 44.3 % (ref 39.0–52.0)
Hemoglobin: 14.3 g/dL (ref 13.0–17.0)
Immature Granulocytes: 1 %
Lymphocytes Relative: 7 %
Lymphs Abs: 1 10*3/uL (ref 0.7–4.0)
MCH: 30 pg (ref 26.0–34.0)
MCHC: 32.3 g/dL (ref 30.0–36.0)
MCV: 93.1 fL (ref 80.0–100.0)
Monocytes Absolute: 0.6 10*3/uL (ref 0.1–1.0)
Monocytes Relative: 5 %
Neutro Abs: 11.1 10*3/uL — ABNORMAL HIGH (ref 1.7–7.7)
Neutrophils Relative %: 87 %
Platelets: 312 10*3/uL (ref 150–400)
RBC: 4.76 MIL/uL (ref 4.22–5.81)
RDW: 13.3 % (ref 11.5–15.5)
WBC: 12.8 10*3/uL — ABNORMAL HIGH (ref 4.0–10.5)
nRBC: 0 % (ref 0.0–0.2)

## 2023-03-17 LAB — BRAIN NATRIURETIC PEPTIDE: B Natriuretic Peptide: 36.1 pg/mL (ref 0.0–100.0)

## 2023-03-17 LAB — RESP PANEL BY RT-PCR (RSV, FLU A&B, COVID)  RVPGX2
Influenza A by PCR: NEGATIVE
Influenza B by PCR: NEGATIVE
Resp Syncytial Virus by PCR: POSITIVE — AB
SARS Coronavirus 2 by RT PCR: NEGATIVE

## 2023-03-17 LAB — TROPONIN I (HIGH SENSITIVITY)
Troponin I (High Sensitivity): 5 ng/L (ref <18)
Troponin I (High Sensitivity): 5 ng/L (ref <18)

## 2023-03-17 LAB — PROCALCITONIN: Procalcitonin: <0.1 ng/mL

## 2023-03-17 LAB — MRSA NEXT GEN BY PCR, NASAL: MRSA by PCR Next Gen: DETECTED — AB

## 2023-03-17 LAB — APTT: aPTT: 31 s (ref 24–36)

## 2023-03-17 LAB — CBG MONITORING, ED: Glucose-Capillary: 247 mg/dL — ABNORMAL HIGH (ref 70–99)

## 2023-03-17 LAB — PROTIME-INR
INR: 1.1 (ref 0.8–1.2)
Prothrombin Time: 14.1 s (ref 11.4–15.2)

## 2023-03-17 MED ORDER — INSULIN ASPART 100 UNIT/ML IV SOLN
10.0000 [IU] | Freq: Once | INTRAVENOUS | Status: AC
  Administered 2023-03-17: 10 [IU] via INTRAVENOUS
  Filled 2023-03-17: qty 0.1

## 2023-03-17 MED ORDER — DEXTROSE 50 % IV SOLN
1.0000 | Freq: Once | INTRAVENOUS | Status: AC
  Administered 2023-03-17: 50 mL via INTRAVENOUS
  Filled 2023-03-17: qty 50

## 2023-03-17 MED ORDER — DILTIAZEM HCL ER COATED BEADS 180 MG PO CP24
180.0000 mg | ORAL_CAPSULE | Freq: Every day | ORAL | Status: DC
  Administered 2023-03-17 – 2023-04-01 (×16): 180 mg via ORAL
  Filled 2023-03-17 (×16): qty 1

## 2023-03-17 MED ORDER — CYCLOBENZAPRINE HCL 10 MG PO TABS
10.0000 mg | ORAL_TABLET | Freq: Every day | ORAL | Status: DC
Start: 2023-03-17 — End: 2023-04-01
  Administered 2023-03-17 – 2023-03-31 (×15): 10 mg via ORAL
  Filled 2023-03-17 (×16): qty 1

## 2023-03-17 MED ORDER — ACETAMINOPHEN 650 MG RE SUPP: 650.0000 mg | Freq: Four times a day (QID) | RECTAL | Status: DC | PRN

## 2023-03-17 MED ORDER — SODIUM CHLORIDE (PF) 0.9 % IJ SOLN
INTRAMUSCULAR | Status: AC
  Filled 2023-03-17: qty 50

## 2023-03-17 MED ORDER — SODIUM CHLORIDE 0.9 % IV SOLN
100.0000 mg | Freq: Two times a day (BID) | INTRAVENOUS | Status: AC
  Administered 2023-03-17 – 2023-03-20 (×7): 100 mg via INTRAVENOUS
  Filled 2023-03-17 (×7): qty 100

## 2023-03-17 MED ORDER — ONDANSETRON HCL 4 MG/2ML IJ SOLN
4.0000 mg | Freq: Four times a day (QID) | INTRAMUSCULAR | Status: DC | PRN
Start: 2023-03-17 — End: 2023-04-01

## 2023-03-17 MED ORDER — ONDANSETRON HCL 4 MG PO TABS: 4.0000 mg | ORAL_TABLET | Freq: Four times a day (QID) | ORAL | Status: DC | PRN

## 2023-03-17 MED ORDER — SENNOSIDES-DOCUSATE SODIUM 8.6-50 MG PO TABS
1.0000 | ORAL_TABLET | Freq: Every evening | ORAL | Status: DC | PRN
  Administered 2023-03-23 – 2023-03-24 (×2): 1 via ORAL
  Filled 2023-03-17 (×2): qty 1

## 2023-03-17 MED ORDER — SODIUM CHLORIDE 0.9% FLUSH
3.0000 mL | Freq: Two times a day (BID) | INTRAVENOUS | Status: DC
  Administered 2023-03-17 – 2023-03-31 (×18): 3 mL via INTRAVENOUS

## 2023-03-17 MED ORDER — POLYVINYL ALCOHOL 1.4 % OP SOLN
1.0000 [drp] | OPHTHALMIC | Status: DC | PRN
  Administered 2023-03-17 – 2023-03-18 (×3): 1 [drp] via OPHTHALMIC
  Filled 2023-03-17: qty 15

## 2023-03-17 MED ORDER — IPRATROPIUM BROMIDE 0.02 % IN SOLN: 0.5000 mg | Freq: Four times a day (QID) | RESPIRATORY_TRACT | Status: DC

## 2023-03-17 MED ORDER — SODIUM CHLORIDE 0.9 % IV SOLN: 250.0000 mL | INTRAVENOUS | Status: AC | PRN

## 2023-03-17 MED ORDER — IOHEXOL 350 MG/ML SOLN
100.0000 mL | Freq: Once | INTRAVENOUS | Status: AC | PRN
  Administered 2023-03-17: 100 mL via INTRAVENOUS

## 2023-03-17 MED ORDER — APIXABAN 5 MG PO TABS: 5.0000 mg | ORAL_TABLET | Freq: Two times a day (BID) | ORAL | Status: DC

## 2023-03-17 MED ORDER — GUAIFENESIN ER 600 MG PO TB12
600.0000 mg | ORAL_TABLET | Freq: Two times a day (BID) | ORAL | Status: DC
  Administered 2023-03-17 – 2023-04-01 (×31): 600 mg via ORAL
  Filled 2023-03-17 (×31): qty 1

## 2023-03-17 MED ORDER — IPRATROPIUM-ALBUTEROL 0.5-2.5 (3) MG/3ML IN SOLN
3.0000 mL | Freq: Once | RESPIRATORY_TRACT | Status: AC
  Administered 2023-03-17: 3 mL via RESPIRATORY_TRACT
  Filled 2023-03-17: qty 3

## 2023-03-17 MED ORDER — TAMSULOSIN HCL 0.4 MG PO CAPS
0.8000 mg | ORAL_CAPSULE | Freq: Every day | ORAL | Status: DC
  Administered 2023-03-17 – 2023-04-01 (×16): 0.8 mg via ORAL
  Filled 2023-03-17 (×16): qty 2

## 2023-03-17 MED ORDER — INSULIN ASPART 100 UNIT/ML IJ SOLN
0.0000 [IU] | Freq: Every day | INTRAMUSCULAR | Status: DC
  Administered 2023-03-17 – 2023-03-21 (×5): 2 [IU] via SUBCUTANEOUS
  Administered 2023-03-23: 3 [IU] via SUBCUTANEOUS
  Filled 2023-03-17: qty 0.05

## 2023-03-17 MED ORDER — IPRATROPIUM-ALBUTEROL 0.5-2.5 (3) MG/3ML IN SOLN: 3.0000 mL | Freq: Four times a day (QID) | RESPIRATORY_TRACT | Status: DC

## 2023-03-17 MED ORDER — MUPIROCIN 2 % EX OINT
1.0000 | TOPICAL_OINTMENT | Freq: Two times a day (BID) | CUTANEOUS | Status: AC
  Administered 2023-03-17 – 2023-03-21 (×10): 1 via NASAL
  Filled 2023-03-17 (×2): qty 22

## 2023-03-17 MED ORDER — FOLIC ACID 1 MG PO TABS
1.0000 mg | ORAL_TABLET | Freq: Every day | ORAL | Status: DC
  Administered 2023-03-17 – 2023-04-01 (×16): 1 mg via ORAL
  Filled 2023-03-17 (×16): qty 1

## 2023-03-17 MED ORDER — ACETYLCYSTEINE 20 % IN SOLN
4.0000 mL | Freq: Four times a day (QID) | RESPIRATORY_TRACT | Status: DC
  Filled 2023-03-17 (×2): qty 4

## 2023-03-17 MED ORDER — HYDRALAZINE HCL 20 MG/ML IJ SOLN
10.0000 mg | Freq: Three times a day (TID) | INTRAMUSCULAR | Status: DC | PRN
  Administered 2023-03-19: 10 mg via INTRAVENOUS
  Filled 2023-03-17: qty 1

## 2023-03-17 MED ORDER — ACETAMINOPHEN 325 MG PO TABS
650.0000 mg | ORAL_TABLET | Freq: Four times a day (QID) | ORAL | Status: DC | PRN
  Administered 2023-03-17 – 2023-04-01 (×17): 650 mg via ORAL
  Filled 2023-03-17 (×19): qty 2

## 2023-03-17 MED ORDER — SODIUM CHLORIDE 0.9 % IV SOLN
2.0000 g | INTRAVENOUS | Status: AC
  Administered 2023-03-17 – 2023-03-23 (×7): 2 g via INTRAVENOUS
  Filled 2023-03-17 (×7): qty 20

## 2023-03-17 MED ORDER — LEVALBUTEROL HCL 0.63 MG/3ML IN NEBU
0.6300 mg | INHALATION_SOLUTION | Freq: Four times a day (QID) | RESPIRATORY_TRACT | Status: DC
Start: 2023-03-17 — End: 2023-03-22
  Administered 2023-03-17 – 2023-03-22 (×16): 0.63 mg via RESPIRATORY_TRACT
  Filled 2023-03-17 (×19): qty 3

## 2023-03-17 MED ORDER — VITAMIN D 25 MCG (1000 UNIT) PO TABS
1000.0000 [IU] | ORAL_TABLET | Freq: Every day | ORAL | Status: DC
  Administered 2023-03-17 – 2023-04-01 (×16): 1000 [IU] via ORAL
  Filled 2023-03-17 (×16): qty 1

## 2023-03-17 MED ORDER — SODIUM CHLORIDE 0.9% FLUSH: 3.0000 mL | INTRAVENOUS | Status: DC | PRN

## 2023-03-17 MED ORDER — LEVALBUTEROL HCL 0.63 MG/3ML IN NEBU: 0.6300 mg | INHALATION_SOLUTION | Freq: Four times a day (QID) | RESPIRATORY_TRACT | Status: DC

## 2023-03-17 MED ORDER — IPRATROPIUM-ALBUTEROL 0.5-2.5 (3) MG/3ML IN SOLN
3.0000 mL | RESPIRATORY_TRACT | Status: DC | PRN
  Administered 2023-03-22 – 2023-03-23 (×2): 3 mL via RESPIRATORY_TRACT
  Filled 2023-03-17 (×2): qty 3

## 2023-03-17 MED ORDER — DULOXETINE HCL 60 MG PO CPEP
60.0000 mg | ORAL_CAPSULE | Freq: Two times a day (BID) | ORAL | Status: DC
Start: 2023-03-17 — End: 2023-04-01
  Administered 2023-03-17 – 2023-04-01 (×30): 60 mg via ORAL
  Filled 2023-03-17: qty 2
  Filled 2023-03-17: qty 1
  Filled 2023-03-17: qty 2
  Filled 2023-03-17 (×6): qty 1
  Filled 2023-03-17: qty 2
  Filled 2023-03-17 (×2): qty 1
  Filled 2023-03-17 (×2): qty 2
  Filled 2023-03-17 (×2): qty 1
  Filled 2023-03-17 (×2): qty 2
  Filled 2023-03-17 (×3): qty 1
  Filled 2023-03-17: qty 2
  Filled 2023-03-17 (×8): qty 1

## 2023-03-17 MED ORDER — INSULIN ASPART 100 UNIT/ML IJ SOLN
0.0000 [IU] | Freq: Three times a day (TID) | INTRAMUSCULAR | Status: DC
Start: 2023-03-17 — End: 2023-03-18
  Administered 2023-03-17 (×3): 2 [IU] via SUBCUTANEOUS
  Administered 2023-03-18: 1 [IU] via SUBCUTANEOUS
  Administered 2023-03-18: 3 [IU] via SUBCUTANEOUS
  Filled 2023-03-17: qty 0.06

## 2023-03-17 MED ORDER — CALCIUM GLUCONATE-NACL 1-0.675 GM/50ML-% IV SOLN
1.0000 g | Freq: Once | INTRAVENOUS | Status: AC
Start: 2023-03-17 — End: 2023-03-17
  Administered 2023-03-17: 1000 mg via INTRAVENOUS
  Filled 2023-03-17: qty 50

## 2023-03-17 MED ORDER — BUSPIRONE HCL 5 MG PO TABS
15.0000 mg | ORAL_TABLET | Freq: Every day | ORAL | Status: DC
  Administered 2023-03-17 – 2023-04-01 (×16): 15 mg via ORAL
  Filled 2023-03-17 (×5): qty 3
  Filled 2023-03-17: qty 2
  Filled 2023-03-17: qty 3
  Filled 2023-03-17 (×3): qty 2
  Filled 2023-03-17 (×5): qty 3
  Filled 2023-03-17: qty 2

## 2023-03-17 MED ORDER — APIXABAN 5 MG PO TABS
5.0000 mg | ORAL_TABLET | Freq: Two times a day (BID) | ORAL | Status: DC
Start: 2023-03-17 — End: 2023-04-01
  Administered 2023-03-17 – 2023-04-01 (×30): 5 mg via ORAL
  Filled 2023-03-17 (×30): qty 1

## 2023-03-17 MED ORDER — MELATONIN 3 MG PO TABS
6.0000 mg | ORAL_TABLET | Freq: Every day | ORAL | Status: DC
  Administered 2023-03-17 – 2023-03-31 (×15): 6 mg via ORAL
  Filled 2023-03-17 (×15): qty 2

## 2023-03-17 MED ORDER — INFLUENZA VAC A&B SURF ANT ADJ 0.5 ML IM SUSY
0.5000 mL | PREFILLED_SYRINGE | INTRAMUSCULAR | Status: AC
  Administered 2023-03-18: 0.5 mL via INTRAMUSCULAR
  Filled 2023-03-17: qty 0.5

## 2023-03-17 MED ORDER — SODIUM CHLORIDE 0.9% FLUSH
3.0000 mL | Freq: Two times a day (BID) | INTRAVENOUS | Status: DC
  Administered 2023-03-17 – 2023-04-01 (×17): 3 mL via INTRAVENOUS

## 2023-03-17 MED ORDER — IPRATROPIUM BROMIDE 0.02 % IN SOLN
0.5000 mg | Freq: Four times a day (QID) | RESPIRATORY_TRACT | Status: DC
Start: 2023-03-17 — End: 2023-03-22
  Administered 2023-03-17 – 2023-03-22 (×16): 0.5 mg via RESPIRATORY_TRACT
  Filled 2023-03-17 (×19): qty 2.5

## 2023-03-17 MED ORDER — METHYLPREDNISOLONE SODIUM SUCC 40 MG IJ SOLR
40.0000 mg | Freq: Two times a day (BID) | INTRAMUSCULAR | Status: AC
  Administered 2023-03-17 – 2023-03-21 (×10): 40 mg via INTRAVENOUS
  Filled 2023-03-17 (×10): qty 1

## 2023-03-17 MED ORDER — CHLORHEXIDINE GLUCONATE CLOTH 2 % EX PADS
6.0000 | MEDICATED_PAD | Freq: Every day | CUTANEOUS | Status: DC
  Administered 2023-03-17 – 2023-03-21 (×3): 6 via TOPICAL

## 2023-03-17 MED ORDER — ORAL CARE MOUTH RINSE: 15.0000 mL | OROMUCOSAL | Status: DC | PRN

## 2023-03-17 MED ORDER — ACETYLCYSTEINE 20 % IN SOLN
4.0000 mL | Freq: Three times a day (TID) | RESPIRATORY_TRACT | Status: DC
Start: 2023-03-17 — End: 2023-03-20
  Administered 2023-03-17 – 2023-03-20 (×8): 4 mL via RESPIRATORY_TRACT
  Filled 2023-03-17 (×12): qty 4

## 2023-03-17 MED ORDER — SODIUM ZIRCONIUM CYCLOSILICATE 10 G PO PACK
10.0000 g | PACK | Freq: Once | ORAL | Status: AC
  Administered 2023-03-17: 10 g via ORAL
  Filled 2023-03-17: qty 1

## 2023-03-17 NOTE — ED Triage Notes (Signed)
BIBA Per EMS: Pt coming from home w/ c/o SHOB.  O2 sats drop upon exertion  Wheezing in RLQ 5L [place] at baseline. Hx lung Ca and COPD  Duoneb  Albuterol neb  Solumedrol given en route  20G L hand

## 2023-03-17 NOTE — Progress Notes (Signed)
RT note: Pt. remains on HFNC(Salter)Device at 6 lpm this time, would like to remain on current device for h/s since being unable to tolerated our full face masks, made aware to notify if needed, RT to  monitor.

## 2023-03-17 NOTE — Progress Notes (Signed)
Found patient with O2 saturation 81 percent. Supplemental O2 tank on the stretcher empty and not supplying adequate flow. Placed on wall O2 source. Difficult maintaining O2 saturations of 92% or greater. Changed to high flow Salter @ 8 lpm. RN made aware of findings and changes.

## 2023-03-17 NOTE — ED Provider Triage Note (Signed)
Emergency Medicine Provider Triage Evaluation Note  Mario [name] , a 73 y.o. male  was evaluated in triage.  Pt complains of shortness of breath, cough, congestion for 3 days.  EMS gave DuoNebs and Solu-Medrol.  Reports desaturation to the 70s with exertion.    pmhx significant for COPD, HTN, bronchitis, depression, PE on eliquis, no missed doses, CAD, lung cancer, chronic resp failure (on 4L), and obesity.   Review of Systems  Positive: Productive cough, chest pain, shortness of breath, sick contacts Negative: Leg swelling, fever  Physical Exam  There were no vitals taken for this visit. Gen:   Awake, no distress   Resp:  Normal effort  MSK:   Moves extremities without difficulty  Other:  Diminished breath sounds bilaterally with expiratory wheezing  Medical Decision Making  Medically screening exam initiated at 12:38 AM.  Appropriate orders placed.  Mario [name] was informed that the remainder of the evaluation will be completed by another provider, this initial triage assessment does not replace that evaluation, and the importance of remaining in the ED until their evaluation is complete. Labs, chest x-ray, treatment for COPD exacerbation   Mario [name], Mario Senior, MD 03/17/23 [phone]

## 2023-03-17 NOTE — Plan of Care (Signed)
  Problem: Education: Goal: Ability to describe self-care measures that may prevent or decrease complications (Diabetes Survival Skills Education) will improve Outcome: Progressing Goal: Individualized Educational Video(s) Outcome: Progressing   Problem: Coping: Goal: Ability to adjust to condition or change in health will improve Outcome: Progressing   Problem: Fluid Volume: Goal: Ability to maintain a balanced intake and output will improve Outcome: Progressing   Problem: Health Behavior/Discharge Planning: Goal: Ability to identify and utilize available resources and services will improve Outcome: Progressing Goal: Ability to manage health-related needs will improve Outcome: Progressing   Problem: Metabolic: Goal: Ability to maintain appropriate glucose levels will improve Outcome: Progressing   Problem: Nutritional: Goal: Maintenance of adequate nutrition will improve Outcome: Progressing Goal: Progress toward achieving an optimal weight will improve Outcome: Progressing   Problem: Skin Integrity: Goal: Risk for impaired skin integrity will decrease Outcome: Progressing   Problem: Tissue Perfusion: Goal: Adequacy of tissue perfusion will improve Outcome: Progressing   Problem: Education: Goal: Knowledge of disease or condition will improve Outcome: Progressing Goal: Knowledge of the prescribed therapeutic regimen will improve Outcome: Progressing Goal: Individualized Educational Video(s) Outcome: Progressing   Problem: Activity: Goal: Ability to tolerate increased activity will improve Outcome: Progressing Goal: Will verbalize the importance of balancing activity with adequate rest periods Outcome: Progressing   Problem: Respiratory: Goal: Ability to maintain a clear airway will improve Outcome: Progressing Goal: Levels of oxygenation will improve Outcome: Progressing Goal: Ability to maintain adequate ventilation will improve Outcome: Progressing

## 2023-03-17 NOTE — H&P (Addendum)
History and Physical    Mario [name] CBJ:[id number] DOB: 07-21-1950 DOA: 03/17/2023  PCP: [name]   Patient coming from: Home   Chief Complaint:  Chief Complaint  Patient presents with   Shortness of Breath   ED TRIAGE note:BIBA Per EMS: Pt coming from home w/ c/o [facility].  O2 sats drop upon exertion  Wheezing in RLQ 5L [place] at baseline. Hx lung Ca and COPD  Duoneb  Albuterol neb  Solumedrol given en route  20G L hand   HPI:  Mario [name] is a 73 y.o. 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  Patient reported that he has productive yellow and greenish.  He had sick contact at home including his caregiver.  Usually wears 5 L oxygen at the baseline but patient believes that his oxygen saturation dropping to 70% while he walks.  Patient also complaining about chest pain with coughing.  In route to ED via EMS patient was treated with bronchodilator and Solu-Medrol.  During my evaluation at the bedside patient reported that with the breathing treatment patient shortness of breath has been significantly improved as well as wheezing has been improved as well.  Patient is endorsing cough with yellow and grayish sputum production.  Denies any fever and chill.  Patient is also reported that he is having exertional dyspnea.  Denies any fever and chill.  Denies any abdominal pain, nausea, vomiting and diarrhea.  No other complaint at this time.   ED Course:  At presentation to ED patient was initially on BiPAP and currently has been weaned down to nasal cannula oxygen 6 L O2 sat 93%.   Tachycardic 102, tachypnea 22 and blood pressure is borderline soft to normotensive.  BMP showing low sodium 132, elevated potassium 5.4 Limited blood glucose 229 otherwise unremarkable. Troponin 5 x 2. Respiratory panel positive with RSV. BNP 36. CBC showing mild leukocytosis 12.8.  Chest x-ray showing chronic interstitial marking and persistent with right basilar consolidation and right mid to lower zone consolidation.  Trace small pleural effusion bilaterally left greater than the right.  Pending CTA of the chest. ED physician Dr. Manus Gunning reported that currently patient O2 sat is 90 to 23% on 6 L but with ambulation patient's O2 sat dropped to mid 80s.  In the ED patient has been treated with multiple DuoNeb nebulizer treatment.  Hospitalist has been contacted for further evaluation and management of COPD exacerbation and concern for pneumonia.  Significant labs in the ED: Lab Orders         Resp panel by RT-PCR (RSV, Flu A&B, Covid) Anterior Nasal Swab         Culture, blood (Routine X 2) w Reflex to ID Panel         Expectorated Sputum Assessment w Gram Stain, Rflx to Resp Cult         CBC with Differential         Brain natriuretic peptide         Basic metabolic panel         Procalcitonin         Protime-INR  Comprehensive metabolic panel         CBC         APTT         Legionella Pneumophila Serogp 1 Ur Ag         Strep pneumoniae urinary antigen         Hemoglobin A1c       Review of Systems:  Review of Systems  Constitutional:  Negative for chills, fever, malaise/fatigue and weight loss.  Respiratory:  Positive for cough, sputum production, shortness of breath and wheezing.   Cardiovascular:  Negative for chest pain and palpitations.  Gastrointestinal:  Negative for abdominal pain, nausea and vomiting.  Musculoskeletal:  Negative for myalgias.  Neurological:  Negative for dizziness and headaches.  Endo/Heme/Allergies:  Negative for environmental allergies.  Does not bruise/bleed easily.  Psychiatric/Behavioral:  The patient is not nervous/anxious.   All other systems reviewed and are negative.   Past Medical History:  Diagnosis Date   Ankylosing spondylitis lumbar region [facility]) 09/25/2022   Anxiety    Bronchitis    COPD (chronic obstructive pulmonary disease) (HCC)    Depression    History of radiation therapy    Left lung- 10/05/20-10/15/20- Dr. Antony Blackbird   Hypertension    lung ca 09/2020   MI (myocardial infarction) [facility])    ????   On home oxygen therapy    4L/min [place]   OSA (obstructive sleep apnea)    Suicide attempt (HCC)    Tension pneumothorax 06/27/2016   Uveitis     Past Surgical History:  Procedure Laterality Date   BIOPSY  07/03/2021   Procedure: BIOPSY;  Surgeon: Kathi Der, MD;  Location: [facility] ENDOSCOPY;  Service: Gastroenterology;;   BRONCHIAL BIOPSY  07/30/2020   Procedure: BRONCHIAL BIOPSIES;  Surgeon: Josephine Igo, DO;  Location: [facility] ENDOSCOPY;  Service: Pulmonary;;   BRONCHIAL BRUSHINGS  07/30/2020   Procedure: BRONCHIAL BRUSHINGS;  Surgeon: Josephine Igo, DO;  Location: [facility] ENDOSCOPY;  Service: Pulmonary;;   BRONCHIAL NEEDLE ASPIRATION BIOPSY  07/30/2020   Procedure: BRONCHIAL NEEDLE ASPIRATION BIOPSIES;  Surgeon: Josephine Igo, DO;  Location: [facility] ENDOSCOPY;  Service: Pulmonary;;   BRONCHIAL WASHINGS  07/30/2020   Procedure: BRONCHIAL WASHINGS;  Surgeon: Josephine Igo, DO;  Location: [facility] ENDOSCOPY;  Service: Pulmonary;;   CHEST TUBE INSERTION Left 06/27/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     ESOPHAGOGASTRODUODENOSCOPY N/A 07/03/2021   Procedure: ESOPHAGOGASTRODUODENOSCOPY (EGD);  Surgeon: Kathi Der, MD;  Location: [facility];  Service: Gastroenterology;  Laterality: N/A;   IR PERC PLEURAL DRAIN W/INDWELL CATH W/IMG GUIDE  08/04/2021   IR REMOVAL OF PLURAL CATH W/CUFF  09/01/2021   SKIN CANCER EXCISION     VIDEO BRONCHOSCOPY WITH ENDOBRONCHIAL NAVIGATION Left 07/30/2020   Procedure: VIDEO BRONCHOSCOPY WITH  ENDOBRONCHIAL NAVIGATION;  Surgeon: Josephine Igo, DO;  Location: [facility] ENDOSCOPY;  Service: Pulmonary;  Laterality: Left;     reports that he quit smoking about 6 years ago. His smoking use included cigarettes. He started smoking about 41 years ago. He has a 35 pack-year smoking history. He has been exposed to tobacco smoke. He has never used smokeless tobacco. He reports that he does not drink alcohol and does not use drugs.  Allergies  Allergen Reactions   Demerol [Meperidine] Nausea And Vomiting and Other (See Comments)    Made the patient "violently sick"   Zocor [Simvastatin] Nausea And Vomiting and Other (See Comments)    Made him very jittery, also   Beet [Beta Vulgaris] Nausea And  Vomiting   Collard Greens [Wild Lettuce Extract (Lactuca Virosa)] Other (See Comments)    Throw up   Liver Nausea And Vomiting    Family History  Problem Relation Age of Onset   Dementia Father     Prior to Admission medications   Medication Sig Start Date End Date Taking? Authorizing Provider  albuterol (PROVENTIL) (2.5 MG/3ML) 0.083% nebulizer solution Inhale 3 mLs (2.5 mg total) by nebulization every 4 (four) hours as needed for wheezing or shortness of breath. 08/04/20 11/03/22  Rolly Salter, MD  albuterol (VENTOLIN HFA) 108 (90 Base) MCG/ACT inhaler Inhale 2 puffs into the lungs every 4 (four) hours as needed for wheezing or shortness of breath. 11/04/21   Glade Lloyd, MD  ascorbic acid (VITAMIN C) 500 MG tablet Take 500 mg by mouth daily.    [provider]  bisacodyl (DULCOLAX) 5 MG EC tablet Take 2 tablets (10 mg total) by mouth daily as needed for moderate constipation. 01/26/23   Arby Barrette, MD  buPROPion (WELLBUTRIN XL) 300 MG 24 hr tablet Take 300 mg by mouth every morning. 02/05/20   [provider]  busPIRone (BUSPAR) 15 MG tablet Take 15 mg by mouth daily.    [provider]  Carboxymethylcellulose Sod PF 0.5 % SOLN Place 1 drop into both eyes 4 (four)  times daily as needed (dry eyes).    [provider]  cholecalciferol (VITAMIN D3) 25 MCG (1000 UNIT) tablet Take 1,000 Units by mouth daily.    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    [provider]  cyclobenzaprine (FLEXERIL) 10 MG tablet Take 10 mg by mouth at bedtime.    [provider]  cycloSPORINE (RESTASIS) 0.05 % ophthalmic emulsion Place 1 drop into both eyes every 12 (twelve) hours.    [provider]  diltiazem (CARDIZEM CD) 180 MG 24 hr capsule Take 180 mg by mouth daily.    [provider]  DULoxetine (CYMBALTA) 60 MG capsule Take 60 mg by mouth 2 (two) times daily.    [provider]  fluticasone (FLONASE) 50 MCG/ACT nasal spray Place 2 sprays into both nostrils daily.     [provider]  folic acid (FOLVITE) 1 MG tablet Take 1 mg by mouth daily. Patient not taking: Reported on 11/03/2022    [provider]  gabapentin (NEURONTIN) 100 MG capsule Take 100 mg by mouth 3 (three) times daily.    [provider]  GARLIC PO Take 1 tablet by mouth daily.    [provider]  KRILL OIL PO Take 2 capsules by mouth daily.    [provider]  lurasidone (LATUDA) 40 MG TABS tablet Take 40 mg by mouth every evening. After supper    [provider]  Melatonin 3 MG CAPS Take 6 mg by mouth at bedtime.    [provider]  metFORMIN (GLUCOPHAGE) 850 MG tablet Take 850 mg by mouth every evening.    [provider]  Nutritional Supplements (ENSURE ORIGINAL) LIQD Take 1 Bottle by mouth daily as needed (in between meals).    [provider]  omeprazole (PRILOSEC) 20 MG capsule Take 1 capsule (20 mg total) by mouth daily. 01/26/23   Arby Barrette, MD  oxcarbazepine (TRILEPTAL) 600 MG tablet Take 600 mg by mouth in the morning, at noon, and at bedtime.     [provider]  oxyCODONE (OXY IR/ROXICODONE) 5 MG immediate release tablet Take 1-2 tablets (5-10 mg total) by mouth every 4 (four) hours as needed for severe pain. 09/10/22   Smitty Knudsen, PA-C  polyethylene glycol powder (GLYCOLAX/MIRALAX) 17 GM/SCOOP powder Take 17 g by mouth daily.    [provider]  tamsulosin (FLOMAX) 0.4 MG CAPS capsule Take 0.8 mg by mouth daily.    [provider]  Tiotropium Bromide-Olodaterol 2.5-2.5 MCG/ACT AERS Inhale 2 each into the lungs every morning. 2 puffs    [provider]  traZODone (DESYREL) 50 MG tablet Take 75 mg by mouth at bedtime.    [provider]  vitamin B-12 (CYANOCOBALAMIN) 100 MCG tablet Take 100 mcg by mouth daily.    [provider]     Physical Exam: Vitals:   03/17/23 0230 03/17/23 0300 03/17/23 0316 03/17/23 0509  BP: 130/72 (!) 142/73 (!) 141/74   Pulse: 100 (!) 103 (!) 102   Resp:   (!) 22   Temp:      TempSrc:      SpO2: 90% 90% 93% 92%  Weight:      Height:        Physical Exam Constitutional:      General: He is not in acute distress.    Appearance: He is obese. He is not ill-appearing.  Cardiovascular:     Rate and Rhythm: Regular rhythm. Tachycardia present.  Pulmonary:     Effort: Pulmonary effort is normal. No respiratory distress.     Breath sounds: No stridor. Examination of the right-upper field reveals wheezing and rhonchi. Examination of the left-upper field reveals wheezing and rhonchi. Examination of the right-middle field reveals rhonchi. Examination of the left-middle field reveals rhonchi. Wheezing and rhonchi present. No decreased breath sounds or rales.  Musculoskeletal:     Right lower leg: No edema.     Left lower leg: No edema.  Skin:    Capillary Refill: Capillary refill takes less than 2 seconds.  Neurological:     Mental Status: He is alert and oriented to person, place, and time.  Psychiatric:        Mood and Affect: Mood normal. Mood  is not anxious.      Labs on Admission: I have personally reviewed following labs and imaging studies  CBC: Recent Labs  Lab 03/17/23 0103  WBC 12.8*  NEUTROABS 11.1*  HGB 14.3  HCT 44.3  MCV 93.1  PLT 312   Basic Metabolic Panel: Recent Labs  Lab 03/17/23 0232  NA 134*  K 5.2*  CL 100  CO2 23  GLUCOSE 229*  BUN 18  CREATININE 1.14  CALCIUM 8.9   GFR: Estimated Creatinine Clearance: 88.4 mL/min (by C-G formula based on SCr of 1.14 mg/dL). Liver Function Tests: No results for input(s): "AST", "ALT", "ALKPHOS", "BILITOT", "PROT", "ALBUMIN" in the last 168 hours. No results for input(s): "LIPASE", "AMYLASE" in the last 168 hours. No results for input(s): "AMMONIA" in the last 168 hours. Coagulation Profile: No results for input(s): "INR", "PROTIME" in the last 168 hours. Cardiac Enzymes: Recent Labs  Lab 03/17/23 0103 03/17/23 0232  TROPONINIHS 5 5   BNP (last 3 results) Recent Labs    10/19/22 1854 11/03/22 0158 03/17/23 0103  BNP 30.6 30.1 36.1   HbA1C: No results for input(s): "HGBA1C" in the last 72 hours. CBG: No results for input(s): "GLUCAP" in the last 168 hours. Lipid Profile: No results for input(s): "CHOL", "HDL", "LDLCALC", "TRIG", "CHOLHDL", "LDLDIRECT" in the last  72 hours. Thyroid Function Tests: No results for input(s): "TSH", "T4TOTAL", "FREET4", "T3FREE", "THYROIDAB" in the last 72 hours. Anemia Panel: No results for input(s): "VITAMINB12", "FOLATE", "FERRITIN", "TIBC", "IRON", "RETICCTPCT" in the last 72 hours. Urine analysis:    Component Value Date/Time   COLORURINE YELLOW 09/25/2022 1030   APPEARANCEUR CLEAR 09/25/2022 1030   LABSPEC 1.023 09/25/2022 1030   PHURINE 6.0 09/25/2022 1030   GLUCOSEU NEGATIVE 09/25/2022 1030   HGBUR NEGATIVE 09/25/2022 1030   BILIRUBINUR NEGATIVE 09/25/2022 1030   KETONESUR NEGATIVE 09/25/2022 1030   PROTEINUR NEGATIVE 09/25/2022 1030   UROBILINOGEN 1.0 01/12/2013 0024   NITRITE NEGATIVE  09/25/2022 1030   LEUKOCYTESUR NEGATIVE 09/25/2022 1030    Radiological Exams on Admission: I have personally reviewed images CT Angio Chest PE W and/or Wo Contrast Result Date: 03/17/2023 CLINICAL DATA:  Pulmonary embolism suspected, low to intermediate probability. Positive D-dimer. EXAM: CT ANGIOGRAPHY CHEST WITH CONTRAST TECHNIQUE: Multidetector CT imaging of the chest was performed using the standard protocol during bolus administration of intravenous contrast. Multiplanar CT image reconstructions and MIPs were obtained to evaluate the vascular anatomy. RADIATION DOSE REDUCTION: This exam was performed according to the departmental dose-optimization program which includes automated exposure control, adjustment of the mA and/or kV according to patient size and/or use of iterative reconstruction technique. CONTRAST:  OMNIPAQUE IOHEXOL 350 MG/ML SOLN COMPARISON:  11/03/2022 FINDINGS: Cardiovascular: Satisfactory opacification of the pulmonary arteries to the segmental level. No evidence of pulmonary embolism. Normal heart size. No pericardial effusion. Atheromatous calcification of the aorta and coronaries. Mediastinum/Nodes: Negative for mass or adenopathy. No esophageal thickening. Lungs/Pleura: Centrilobular emphysema and generalized airway thickening. Chronic airspace disease in the lower lungs with some volume loss. Band of opacity is also seen at the lingula. Compared to prior there is some progression of patchy nodular airspace disease in the right lower lobe along the upper extent of the consolidation. No edema, effusion, or air leak Upper Abdomen: No acute finding or change from prior. Musculoskeletal: Remote lateral left seventh and eighth rib fractures, unchanged. Generalized thoracic spine degeneration with multilevel bridging thoracic osteophytes, chart history of ankylosing spondylitis. Review of the MIP images confirms the above findings. IMPRESSION: 1. Negative for pulmonary embolism.  2. Chronic airspace disease at the lung bases which may relate to chart history of stage IV lung cancer. Progressed area in the right lower lobe since 11/03/2022, this could be superimposed infectious pneumonia or progression of the chronic disease. 3. Atherosclerosis and emphysema. Electronically Signed   By: Tiburcio Pea M.D.   On: 03/17/2023 05:21   DG Chest 2 View Result Date: 03/17/2023 CLINICAL DATA:  Sob Increasing shortness of breath,wheezing,lung cancer COPD EXAM: CHEST - 2 VIEW COMPARISON:  Chest x-ray 10/31/2022, CT chest 11/03/2022 FINDINGS: The heart and mediastinal contours are unchanged. Atherosclerotic plaque. Chronic coarsened interstitial markings with persistent right basilar consolidation and left mid to lower lung zone consolidation. Persistent trace to small volume pleural effusions bilaterally, left greater than right. No pneumothorax. No acute osseous abnormality. IMPRESSION: 1. Chronic coarsened interstitial markings with persistent right basilar consolidation and left mid to lower lung zone consolidation. Persistent trace to small volume pleural effusions bilaterally, left greater than right. 2.  Aortic Atherosclerosis (ICD10-I70.0). Electronically Signed   By: Tish Frederickson M.D.   On: 03/17/2023 01:15     EKG: My personal interpretation of EKG shows: EKG showing sinus and ectopic tachycardia heart rate 110.     Assessment/Plan: Principal Problem:   Community acquired pneumonia Active Problems:  Hyperkalemia   COPD with acute exacerbation (HCC)   Acute on chronic hypoxic respiratory failure (HCC)   History of lung cancer   Essential hypertension   History of pulmonary embolus (PE)   Acute hypoxic on chronic hypercapnic respiratory failure (HCC)    Assessment and Plan: Acute on chronic hypoxic respiratory failure Chronic hypoxic respiratory failure 4-5l [place] oxygen at baseline Community-acquired pneumonia RSV infection > Patient presented with complaining of  cough, sputum production, decreasing O2 sat to upper 70s on ambulation at home and shortness of breath.  Reported home caretaker is sick and he probably contracted infection from caretaker.  Patient usually on 3 to 4 L oxygen at the baseline - In route to ED patient has given albuterol nebulizer and Solu-Medrol 125 mg. - At presentation to ED patient was maintaining O2 sat 91 to 95% on 5 to 6 L oxygen.  However during ambulation patient O2 sat dropped to low 80s. -Patient is afebrile.  CBC showing leukocytosis 12.8.  Normal BNP.  Respiratory panel positive with RSV. -Physical exam showing bilateral upper lung field wheezing and rhonchi. - Chest x-ray showed bilateral lung field consolidation and small pleural effusion. - Pending CTA chest. - Plan to continue treat patient for pneumonia and COPD exacerbation - Checking blood cultures, sputum cultures, urine Legionella urine strep antigen test.  Checking procalcitonin level. - Starting treating with ceftriaxone 2 g daily for 7 days and doxycycline 100 mg twice daily for 7 days - Continue high flow supplemental oxygen, supportive care, continue Mucomyst every 6 hours scheduled with Xopenex and ipratropium. - Continue cardiac monitoring. Addendum: - CTA chest negative for pulmonary embolism.  It showed chronic airspace disease at the lung bases which may relate to chart history of stage IV lung cancer. Progressed area in the right lower lobe since 11/03/2022, this could be superimposed infectious pneumonia or progression of the chronic disease. Atherosclerosis and emphysema. -plan to continue treat patient for pneumonia as mentioned above.    COPD exacerbation Chronic hypoxic respiratory failure 4 to 5 L oxygen at baseline -Severe exacerbation in setting of pneumonia and RSV infection. - By EMS patient was given Solu-Medrol 125 mg - Continue IV Solu-Medrol 40 mg twice daily for 5 days. -Continue Xopenex and ipratropium every 6 hours scheduled and  DuoNeb as needed.  Give patient has history of sinus tachycardia avoiding scheduled DuoNeb. - Continue high flow oxygen and wean down oxygen to nasal cannula oxygen as patient tolerates. -Continue supportive care  Hyperkalemia -Elevated potassium 5.2 without any EKG change -Treating with NovoLog 10 units, dextrose 50 mL, Lokelma 10 g and calcium gluconate 1 g. -Continue monitor potassium level and treat as needed  History of lung cancer-status post radiation chemotherapy Patient reported history of lung cancer [facility].  Unable to retrieve any records on the chart.  Patient reported that he has been following oncology every 3 months and most recent CT scan in November 2024 showed no evidence of recurrent lung cancer. -Recommended patient to follow-up with outpatient oncology on discharge. -Also obtaining CTA chest as mentioned above for ruling out recurrent PE and pneumonia.  If it shows any recurrence of lung cancer in that case will inform our on-call oncologist in the hospital.  Essential hypertension Chronic sinus tachycardia on Cardizem -Continue Cardizem 180 mg daily  History of PE -Patient reported compliant on Eliquis.  Last dose of Eliquis 5 mg yesterday morning 03/16/2023. - Plan to continue Eliquis 5 mg twice daily.  Generalized anxiety disorder -  Continue BuSpar 15 mg daily.  Continue Cymbalta 60 mg twice daily   Prediabetic Patient reported he is prediabetic.  At home he takes metformin. - Holding metformin in acute setting - Checking A1c. - Continue low sliding scale and at bedtime insulin as needed coverage with mealtime.  Insomnia - Continue melatonin.  Holding trazodone in the setting of acute high-pressure respiratory failure  BPH - Continue Flomax   DVT prophylaxis:  SCDs and Eliquis. Code Status:  Full Code.  Verified with patient. Diet: Heart healthy-carb modified Family Communication: None present Disposition Plan: Pending CTA chest,  pneumonia workup.  Pending blood culture sputum culture result.  Tentative discharge to home next 2 to 3 days. Consults: None at this time Admission status:   Inpatient, Step Down Unit  Severity of Illness: The appropriate patient status for this patient is INPATIENT. Inpatient status is judged to be reasonable and necessary in order to provide the required intensity of service to ensure the patient's safety. The patient's presenting symptoms, physical exam findings, and initial radiographic and laboratory data in the context of their chronic comorbidities is felt to place them at high risk for further clinical deterioration. Furthermore, it is not anticipated that the patient will be medically stable for discharge from the hospital within 2 midnights of admission.   * I certify that at the point of admission it is my clinical judgment that the patient will require inpatient hospital care spanning beyond 2 midnights from the point of admission due to high intensity of service, high risk for further deterioration and high frequency of surveillance required.[name]    Tereasa Coop, MD [facility]  How to contact the [facility] Attending or Consulting provider 7A - 7P or covering provider during after hours 7P -7A, for this patient.  Check the care team in [facility] and look for a) attending/consulting [facility] provider listed and b) the [facility] team listed Log into [email or web address] and use [facility] universal password to access. If you do not have the password, please contact the hospital operator. Locate the [facility] provider you are looking for under [facility] and page to a number that you can be directly reached. If you still have difficulty reaching the provider, please page the [facility] (Director on Call) for the Hospitalists listed on amion for assistance.  03/17/2023, 5:28 AM

## 2023-03-17 NOTE — ED Provider Notes (Signed)
[facility] AT [facility] Provider Note   CSN: [id number] Arrival date & time: 03/17/23  0021     History  Chief Complaint  Patient presents with   Shortness of Breath    Mario [name] is a 73 y.o. male.  73 yo male with pmhx significant for COPD, HTN, bronchitis, depression, CAD, lung cancer, chronic resp failure (on 4L), and obesity presents with difficulty breathing with cough and congestion for the past 3 days.  States cough productive with yellow and green mucus.  Has had multiple sick contacts at home including his caregiver.  Wears 5 L of oxygen at baseline and believes his oxygen is dropping when he walks around to the 70s.  Does have sleep apnea as well and uses CPAP at night.  Some chest pain with coughing.  Some episodes of posttussive emesis.  Denies any known fever.  He was given bronchodilators and Solu-Medrol by EMS.  Denies any missed doses of Eliquis. States not receiving treatment for cancer currently and believes it is stable.  The history is provided by the patient.  Shortness of Breath Associated symptoms: chest pain and cough   Associated symptoms: no abdominal pain, no fever, no headaches, no rash and no vomiting        Home Medications Prior to Admission medications   Medication Sig Start Date End Date Taking? Authorizing Provider  albuterol (PROVENTIL) (2.5 MG/3ML) 0.083% nebulizer solution Inhale 3 mLs (2.5 mg total) by nebulization every 4 (four) hours as needed for wheezing or shortness of breath. 08/04/20 11/03/22  Rolly Salter, MD  albuterol (VENTOLIN HFA) 108 (90 Base) MCG/ACT inhaler Inhale 2 puffs into the lungs every 4 (four) hours as needed for wheezing or shortness of breath. 11/04/21   Glade Lloyd, MD  ascorbic acid (VITAMIN C) 500 MG tablet Take 500 mg by mouth daily.    [provider]  bisacodyl (DULCOLAX) 5 MG EC tablet Take 2 tablets (10 mg total) by mouth daily as needed for moderate constipation.  01/26/23   Arby Barrette, MD  buPROPion (WELLBUTRIN XL) 300 MG 24 hr tablet Take 300 mg by mouth every morning. 02/05/20   [provider]  busPIRone (BUSPAR) 15 MG tablet Take 15 mg by mouth daily.    [provider]  Carboxymethylcellulose Sod PF 0.5 % SOLN Place 1 drop into both eyes 4 (four) times daily as needed (dry eyes).    [provider]  cholecalciferol (VITAMIN D3) 25 MCG (1000 UNIT) tablet Take 1,000 Units by mouth daily.    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    [provider]  cyclobenzaprine (FLEXERIL) 10 MG tablet Take 10 mg by mouth at bedtime.    [provider]  cycloSPORINE (RESTASIS) 0.05 % ophthalmic emulsion Place 1 drop into both eyes every 12 (twelve) hours.    [provider]  diltiazem (CARDIZEM CD) 180 MG 24 hr capsule Take 180 mg by mouth daily.    [provider]  DULoxetine (CYMBALTA) 60 MG capsule Take 60 mg by mouth 2 (two) times daily.    [provider]  fluticasone (FLONASE) 50 MCG/ACT nasal spray Place 2 sprays into both nostrils daily.     [provider]  folic acid (FOLVITE) 1 MG tablet Take 1 mg by mouth daily. Patient not taking: Reported on 11/03/2022    [provider]  gabapentin (NEURONTIN) 100 MG capsule Take 100 mg by mouth 3 (three) times daily.    [provider]  GARLIC PO Take 1 tablet by mouth daily.    [provider]  KRILL OIL PO Take 2 capsules by mouth daily.    [provider]  lurasidone (LATUDA) 40 MG TABS tablet Take 40 mg by mouth every evening. After supper    [provider]  Melatonin 3 MG CAPS Take 6 mg by mouth at bedtime.    [provider]  metFORMIN (GLUCOPHAGE) 850 MG tablet Take 850 mg by mouth every evening.    [provider]  Nutritional Supplements (ENSURE  ORIGINAL) LIQD Take 1 Bottle by mouth daily as needed (in between meals).    [provider]  omeprazole (PRILOSEC) 20 MG capsule Take 1 capsule (20 mg total) by mouth daily. 01/26/23   Arby Barrette, MD  oxcarbazepine (TRILEPTAL) 600 MG tablet Take 600 mg by mouth in the morning, at noon, and at bedtime.    [provider]  oxyCODONE (OXY IR/ROXICODONE) 5 MG immediate release tablet Take 1-2 tablets (5-10 mg total) by mouth every 4 (four) hours as needed for severe pain. 09/10/22   Smitty Knudsen, PA-C  polyethylene glycol powder (GLYCOLAX/MIRALAX) 17 GM/SCOOP powder Take 17 g by mouth daily.    [provider]  tamsulosin (FLOMAX) 0.4 MG CAPS capsule Take 0.8 mg by mouth daily.    [provider]  Tiotropium Bromide-Olodaterol 2.5-2.5 MCG/ACT AERS Inhale 2 each into the lungs every morning. 2 puffs    [provider]  traZODone (DESYREL) 50 MG tablet Take 75 mg by mouth at bedtime.    [provider]  vitamin B-12 (CYANOCOBALAMIN) 100 MCG tablet Take 100 mcg by mouth daily.    [provider]      Allergies    Demerol [meperidine], Zocor [simvastatin], Beet [beta vulgaris], Collard greens [wild lettuce extract (lactuca virosa)], and Liver    Review of Systems   Review of Systems  Constitutional:  Positive for appetite change. Negative for activity change, chills and fever.  HENT:  Positive for congestion and rhinorrhea.   Respiratory:  Positive for cough and shortness of breath. Negative for chest tightness.   Cardiovascular:  Positive for chest pain.  Gastrointestinal:  Negative for abdominal pain, nausea and vomiting.  Genitourinary:  Negative for dysuria and hematuria.  Musculoskeletal:  Positive for arthralgias and myalgias.  Skin:  Negative for rash.  Neurological:  Positive for weakness. Negative for headaches.   all other systems are negative except as noted in the HPI and PMH.    Physical Exam Updated Vital  Signs BP 108/65   Pulse (!) 106   Temp 98.3 F (36.8 C) (Oral)   Resp (!) 30   Ht 6\' 2"  (1.88 m)   Wt (!) 139.7 kg   SpO2 91%   BMI 39.54 kg/m  Physical Exam Vitals and nursing note reviewed.  Constitutional:      General: He is not in acute distress.    Appearance: He is well-developed.     Comments: Speaking in full sentences, no distress, tachypneic  HENT:     Head: Normocephalic and atraumatic.     Mouth/Throat:     Pharynx: No oropharyngeal exudate.  Eyes:     Conjunctiva/sclera: Conjunctivae normal.     Pupils: Pupils are equal, round, and reactive to light.  Neck:     Comments: No meningismus. Cardiovascular:  Rate and Rhythm: Normal rate and regular rhythm.     Heart sounds: Normal heart sounds. No murmur heard. Pulmonary:     Effort: Pulmonary effort is normal. No respiratory distress.     Breath sounds: Normal breath sounds. No wheezing.     Comments: Diminished breath sounds at bases Abdominal:     Palpations: Abdomen is soft.     Tenderness: There is no abdominal tenderness. There is no guarding or rebound.  Musculoskeletal:        General: No tenderness. Normal range of motion.     Cervical back: Normal range of motion and neck supple.  Skin:    General: Skin is warm.  Neurological:     General: No focal deficit present.     Mental Status: He is alert and oriented to person, place, and time. Mental status is at baseline.     Cranial Nerves: No cranial nerve deficit.     Motor: No abnormal muscle tone.     Coordination: Coordination normal.     Comments:  5/5 strength throughout. CN 2-12 intact.Equal grip strength.   Psychiatric:        Behavior: Behavior normal.     ED Results / Procedures / Treatments   Labs (all labs ordered are listed, but only abnormal results are displayed) Labs Reviewed  RESP PANEL BY RT-PCR (RSV, FLU A&B, COVID)  [id number] - Abnormal; Notable for the following components:      Result Value   Resp Syncytial Virus by PCR  POSITIVE (*)    All other components within normal limits  CBC WITH DIFFERENTIAL/PLATELET - Abnormal; Notable for the following components:   WBC 12.8 (*)    Neutro Abs 11.1 (*)    All other components within normal limits  BASIC METABOLIC PANEL - Abnormal; Notable for the following components:   Sodium 134 (*)    Potassium 5.2 (*)    Glucose, Bld 229 (*)    All other components within normal limits  BRAIN NATRIURETIC PEPTIDE  PROCALCITONIN  PROTIME-INR  COMPREHENSIVE METABOLIC PANEL  CBC  APTT  TROPONIN I (HIGH SENSITIVITY)  TROPONIN I (HIGH SENSITIVITY)    EKG EKG Interpretation Date/Time:  Friday March 17 2023 00:43:00 EST Ventricular Rate:  110 PR Interval:  150 QRS Duration:  112 QT Interval:  336 QTC Calculation: 455 R Axis:   -54  Text Interpretation: Sinus or ectopic atrial tachycardia Borderline IVCD with LAD Inferior infarct, old Consider anterior infarct No significant change was found Confirmed by Glynn Octave [phone]) on 03/17/2023 12:46:00 AM  Radiology DG Chest 2 View Result Date: 03/17/2023 CLINICAL DATA:  Sob Increasing shortness of breath,wheezing,lung cancer COPD EXAM: CHEST - 2 VIEW COMPARISON:  Chest x-ray 10/31/2022, CT chest 11/03/2022 FINDINGS: The heart and mediastinal contours are unchanged. Atherosclerotic plaque. Chronic coarsened interstitial markings with persistent right basilar consolidation and left mid to lower lung zone consolidation. Persistent trace to small volume pleural effusions bilaterally, left greater than right. No pneumothorax. No acute osseous abnormality. IMPRESSION: 1. Chronic coarsened interstitial markings with persistent right basilar consolidation and left mid to lower lung zone consolidation. Persistent trace to small volume pleural effusions bilaterally, left greater than right. 2.  Aortic Atherosclerosis (ICD10-I70.0). Electronically Signed   By: Tish Frederickson M.D.   On: 03/17/2023 01:15    Procedures .Critical  Care  Performed by: Glynn Octave, MD Authorized by: Glynn Octave, MD   Critical care provider statement:    Critical care time (minutes):  45  Critical care time was exclusive of:  Separately billable procedures and treating other patients   Critical care was necessary to treat or prevent imminent or life-threatening deterioration of the following conditions:  Respiratory failure   Critical care was time spent personally by me on the following activities:  Development of treatment plan with patient or surrogate, discussions with consultants, evaluation of patient's response to treatment, examination of patient, ordering and review of laboratory studies, ordering and review of radiographic studies, ordering and performing treatments and interventions, pulse oximetry, re-evaluation of patient's condition, review of old charts, blood draw for specimens and obtaining history from patient or surrogate   I assumed direction of critical care for this patient from another provider in my specialty: no     Care discussed with: admitting provider       Medications Ordered in ED Medications  ipratropium-albuterol (DUONEB) 0.5-2.5 (3) MG/3ML nebulizer solution 3 mL (has no administration in time range)    ED Course/ Medical Decision Making/ A&P                                 Medical Decision Making Amount and/or Complexity of Data Reviewed Independent Historian: EMS Labs: ordered. Decision-making details documented in ED Course. Radiology: ordered and independent interpretation performed. Decision-making details documented in ED Course. ECG/medicine tests: ordered and independent interpretation performed. Decision-making details documented in ED Course.  Risk Prescription drug management. Decision regarding hospitalization.   3 days of cough, congestion, URI symptoms with chest pain with coughing.  Hypoxic with exertion.  Tachypneic and tachycardic on arrival with borderline O2  saturations at home 5 L of oxygen.  He was given breath and bladders and steroids.  EK G shows no acute ischemia. Chest x-ray with stable interstitial markings and infiltrates.  RSV swab is positive.  COVID and flu swabs are negative.  Patient did desaturate to the mid 80s on his home 4 L of oxygen with attempted ambulation became quite short of breath.  X-ray shows stable interstitial changes and infiltrates.  This is likely chronic but will obtain CT scan to rule out new occult pneumonia and pulmonary embolism.  Denies any missed doses of Eliquis.  The patient states his roommate has also had RSV and been sick as well.  Attempted ambulation, patient desaturates to the mid 80s.  Becomes quite dyspneic.  He does have increased oxygen requirement compared to baseline.  Will plan admission for continued treatment with bronchodilators and steroids.  CT scan to be obtained to rule out occult pneumonia as well as pulmonary embolism.  Troponin remains negative.  Admission for acute on chronic respiratory failure secondary to RSV bronchitis discussed with Dr. Janalyn Shy.        Final Clinical Impression(s) / ED Diagnoses Final diagnoses:  Acute hypoxic respiratory failure (HCC)  RSV bronchitis    Rx / DC Orders ED Discharge Orders     None         Tyrena Gohr, Jeannett Senior, MD 03/17/23 [phone]

## 2023-03-17 NOTE — Progress Notes (Signed)
   03/17/23 0526  BiPAP/CPAP/SIPAP  BiPAP/CPAP/SIPAP Pt Type Adult  Reason BIPAP/CPAP not in use Non-compliant

## 2023-03-17 NOTE — Hospital Course (Signed)
Mario [name] is a 73 yo male with PMH COPD, OSA, lung cancer (with radiation tx), chronic hypoxia on home O2 4L, depression/anxiety, hx PE on Eliquis, morbid obesity, HTN, OSA on CPAP who presented with worsening shortness of breath and cough.  He endorsed sick contacts at home with respiratory infections. CT angio chest was performed which was negative for PE and showed chronic airspace disease with some progression in the right lower lobe concerning for acute infection. He was afebrile with mild leukocytosis, 12.8 on admission. Respiratory swabs were positive for RSV.  He was started on nebs and steroids. Given right lower lobe opacities he was also started on antibiotics.

## 2023-03-17 NOTE — Progress Notes (Signed)
Progress Note    Mario [name]   BMW:[id number]  DOB: 10-14-1950  DOA: 03/17/2023     0 PCP: [name]  Initial CC: SOB  Hospital Course: Mario [name] is a 73 yo male with PMH COPD, OSA, lung cancer (with radiation tx), chronic hypoxia on home O2 4L, depression/anxiety, hx PE on Eliquis, morbid obesity, HTN, OSA on CPAP who presented with worsening shortness of breath and cough.  He endorsed sick contacts at home with respiratory infections. CT angio chest was performed which was negative for PE and showed chronic airspace disease with some progression in the right lower lobe concerning for acute infection. He was afebrile with mild leukocytosis, 12.8 on admission. Respiratory swabs were positive for RSV.  He was started on nebs and steroids. Given right lower lobe opacities he was also started on antibiotics.  Interval History:  Seen this morning in the ER.  Reviewed findings of respiratory swab with him and treatment plan.  Still having ongoing shortness of breath and was on 7 L oxygen when seen.  Assessment and Plan:  Acute on chronic hypoxic respiratory failure CAP RSV infection -Presented with shortness of breath, cough, some sputum and worsening SpO2 despite home O2 -Did endorse sick contacts at home with similar symptoms - RVP swab positive for RSV -CT angio chest concerning for some right lower lobe airspace disease.  In setting of leukocytosis and history of lung cancer, agree with continuing antibiotics for now - continue nebs and steroids also - wean O2 as able     COPD exacerbation Chronic hypoxic respiratory failure 4 to 5 L oxygen at baseline -Severe exacerbation in setting of pneumonia and RSV infection. - continue solumedrol and nebs - continue O2    Hyperkalemia -Elevated potassium 5.2 without any EKG change -Treating with NovoLog 10 units, dextrose 50 mL, Lokelma 10 g and calcium gluconate 1 g. -Continue monitor potassium level and treat as  needed   History of lung cancer-status post radiation chemotherapy Patient reported history of lung cancer following with [facility].  Unable to retrieve any records on the chart.  Patient reported that he has been following oncology every 3 months and most recent CT scan in November 2024 showed no evidence of recurrent lung cancer. 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   Generalized anxiety disorder -Continue BuSpar 15 mg daily.  Continue Cymbalta 60 mg twice daily   Prediabetic Patient reported he is prediabetic.  At home he takes metformin. - Holding metformin in acute setting - Checking A1c. - Continue SSI and CBG monitoring   Insomnia - Continue melatonin.  Holding trazodone    BPH - Continue Flomax   Old records reviewed in assessment of this patient  Antimicrobials: Rocephin 03/17/2023 >> current Doxycycline 03/17/2023 >> current  DVT prophylaxis:  SCDs Start: 03/17/23 0421 Place TED hose Start: 03/17/23 0421 apixaban (ELIQUIS) tablet 5 mg   Code Status:   Code Status: Full Code  Mobility Assessment (Last 72 Hours)     Mobility Assessment   No documentation.           Barriers to discharge: none Disposition Plan:  Home HH orders placed: TBD Status is: Inpt  Objective: Blood pressure (!) 158/83, pulse (!) 114, temperature 98.1 F (36.7 C), temperature source Oral, resp. rate 20, height 6'  2" (1.88 m), weight (!) 141.3 kg, SpO2 93%.  Examination:  Physical Exam Constitutional:      General: He is not in acute distress.    Appearance: Normal appearance.  HENT:     Head: Normocephalic and atraumatic.     Mouth/Throat:     Mouth: Mucous membranes are moist.  Eyes:     Extraocular Movements: Extraocular movements intact.  Cardiovascular:     Rate and Rhythm: Normal rate and  regular rhythm.  Pulmonary:     Effort: Pulmonary effort is normal. No respiratory distress.     Breath sounds: No wheezing.     Comments: Coarse breath sounds bilaterally  Abdominal:     General: Bowel sounds are normal. There is no distension.     Palpations: Abdomen is soft.     Tenderness: There is no abdominal tenderness.  Musculoskeletal:        General: Normal range of motion.     Cervical back: Normal range of motion and neck supple.  Skin:    General: Skin is warm and dry.  Neurological:     General: No focal deficit present.     Mental Status: He is alert.  Psychiatric:        Mood and Affect: Mood normal.        Behavior: Behavior normal.      Consultants:    Procedures:    Data Reviewed: Results for orders placed or performed during the hospital encounter of 03/17/23 (from the past 24 hours)  CBC with Differential     Status: Abnormal   Collection Time: 03/17/23  1:03 AM  Result Value Ref Range   WBC 12.8 (H) 4.0 - 10.5 K/uL   RBC 4.76 4.22 - 5.81 MIL/uL   Hemoglobin 14.3 13.0 - 17.0 g/dL   HCT 62.8 31.5 - 17.6 %   MCV 93.1 80.0 - 100.0 fL   MCH 30.0 26.0 - 34.0 pg   MCHC 32.3 30.0 - 36.0 g/dL   RDW 16.0 73.7 - 10.6 %   Platelets 312 150 - 400 K/uL   nRBC 0.0 0.0 - 0.2 %   Neutrophils Relative % 87 %   Neutro Abs 11.1 (H) 1.7 - 7.7 K/uL   Lymphocytes Relative 7 %   Lymphs Abs 1.0 0.7 - 4.0 K/uL   Monocytes Relative 5 %   Monocytes Absolute 0.6 0.1 - 1.0 K/uL   Eosinophils Relative 0 %   Eosinophils Absolute 0.1 0.0 - 0.5 K/uL   Basophils Relative 0 %   Basophils Absolute 0.1 0.0 - 0.1 K/uL   Immature Granulocytes 1 %   Abs Immature Granulocytes 0.06 0.00 - 0.07 K/uL  Troponin I (High Sensitivity)     Status: None   Collection Time: 03/17/23  1:03 AM  Result Value Ref Range   Troponin I (High Sensitivity) 5 <18 ng/L  Brain natriuretic peptide     Status: None   Collection Time: 03/17/23  1:03 AM  Result Value Ref Range   B Natriuretic Peptide  36.1 0.0 - 100.0 pg/mL  Resp panel by RT-PCR (RSV, Flu A&B, Covid) Anterior Nasal Swab     Status: Abnormal   Collection Time: 03/17/23  1:03 AM   Specimen: Anterior Nasal Swab  Result Value Ref Range   SARS Coronavirus 2 by RT PCR NEGATIVE NEGATIVE   Influenza A by PCR NEGATIVE NEGATIVE   Influenza B by PCR NEGATIVE NEGATIVE   Resp Syncytial Virus by PCR POSITIVE (A) NEGATIVE  Troponin  I (High Sensitivity)     Status: None   Collection Time: 03/17/23  2:32 AM  Result Value Ref Range   Troponin I (High Sensitivity) 5 <18 ng/L  Basic metabolic panel     Status: Abnormal   Collection Time: 03/17/23  2:32 AM  Result Value Ref Range   Sodium 134 (L) 135 - 145 mmol/L   Potassium 5.2 (H) 3.5 - 5.1 mmol/L   Chloride 100 98 - 111 mmol/L   CO2 23 22 - 32 mmol/L   Glucose, Bld 229 (H) 70 - 99 mg/dL   BUN 18 8 - 23 mg/dL   Creatinine, Ser 6.21 0.61 - 1.24 mg/dL   Calcium 8.9 8.9 - 30.8 mg/dL   GFR, Estimated >65 >78 mL/min   Anion gap 11 5 - 15  Procalcitonin     Status: None   Collection Time: 03/17/23  5:30 AM  Result Value Ref Range   Procalcitonin <0.10 ng/mL  Strep pneumoniae urinary antigen     Status: None   Collection Time: 03/17/23  6:07 AM  Result Value Ref Range   Strep Pneumo Urinary Antigen NEGATIVE NEGATIVE  CBG monitoring, ED     Status: Abnormal   Collection Time: 03/17/23  9:56 AM  Result Value Ref Range   Glucose-Capillary 247 (H) 70 - 99 mg/dL  Glucose, capillary     Status: Abnormal   Collection Time: 03/17/23 11:55 AM  Result Value Ref Range   Glucose-Capillary 244 (H) 70 - 99 mg/dL    I have reviewed pertinent nursing notes, vitals, labs, and images as necessary. I have ordered labwork to follow up on as indicated.  I have reviewed the last notes from staff over past 24 hours. I have discussed patient's care plan and test results with nursing staff, CM/SW, and other staff as appropriate.  Time spent: Greater than 50% of the 55 minute visit was spent in  counseling/coordination of care for the patient as laid out in the A&P.   LOS: 0 days   Lewie Chamber, MD [facility] 03/17/2023, 12:10 PM

## 2023-03-18 DIAGNOSIS — J189 Pneumonia, unspecified organism: Secondary | ICD-10-CM | POA: Diagnosis not present

## 2023-03-18 DIAGNOSIS — J159 Unspecified bacterial pneumonia: Secondary | ICD-10-CM | POA: Diagnosis not present

## 2023-03-18 DIAGNOSIS — J9621 Acute and chronic respiratory failure with hypoxia: Secondary | ICD-10-CM | POA: Diagnosis not present

## 2023-03-18 DIAGNOSIS — J121 Respiratory syncytial virus pneumonia: Secondary | ICD-10-CM | POA: Diagnosis not present

## 2023-03-18 DIAGNOSIS — J441 Chronic obstructive pulmonary disease with (acute) exacerbation: Secondary | ICD-10-CM | POA: Diagnosis not present

## 2023-03-18 LAB — COMPREHENSIVE METABOLIC PANEL
ALT: 20 U/L (ref 0–44)
AST: 21 U/L (ref 15–41)
Albumin: 3.7 g/dL (ref 3.5–5.0)
Alkaline Phosphatase: 80 U/L (ref 38–126)
Anion gap: 10 (ref 5–15)
BUN: 21 mg/dL (ref 8–23)
CO2: 25 mmol/L (ref 22–32)
Calcium: 9 mg/dL (ref 8.9–10.3)
Chloride: 99 mmol/L (ref 98–111)
Creatinine, Ser: 0.89 mg/dL (ref 0.61–1.24)
GFR, Estimated: >60 mL/min (ref >60)
Glucose, Bld: 179 mg/dL — ABNORMAL HIGH (ref 70–99)
Potassium: 4.5 mmol/L (ref 3.5–5.1)
Sodium: 134 mmol/L — ABNORMAL LOW (ref 135–145)
Total Bilirubin: 0.2 mg/dL (ref 0.0–1.2)
Total Protein: 7.4 g/dL (ref 6.5–8.1)

## 2023-03-18 LAB — CBC WITH DIFFERENTIAL/PLATELET
Abs Immature Granulocytes: 0.1 10*3/uL — ABNORMAL HIGH (ref 0.00–0.07)
Basophils Absolute: 0 10*3/uL (ref 0.0–0.1)
Basophils Relative: 0 %
Eosinophils Absolute: 0.1 10*3/uL (ref 0.0–0.5)
Eosinophils Relative: 0 %
HCT: 44.5 % (ref 39.0–52.0)
Hemoglobin: 14.5 g/dL (ref 13.0–17.0)
Immature Granulocytes: 1 %
Lymphocytes Relative: 7 %
Lymphs Abs: 1.1 10*3/uL (ref 0.7–4.0)
MCH: 30.7 pg (ref 26.0–34.0)
MCHC: 32.6 g/dL (ref 30.0–36.0)
MCV: 94.1 fL (ref 80.0–100.0)
Monocytes Absolute: 1.3 10*3/uL — ABNORMAL HIGH (ref 0.1–1.0)
Monocytes Relative: 8 %
Neutro Abs: 14.1 10*3/uL — ABNORMAL HIGH (ref 1.7–7.7)
Neutrophils Relative %: 84 %
Platelets: 393 10*3/uL (ref 150–400)
RBC: 4.73 MIL/uL (ref 4.22–5.81)
RDW: 13.2 % (ref 11.5–15.5)
WBC: 16.7 10*3/uL — ABNORMAL HIGH (ref 4.0–10.5)
nRBC: 0 % (ref 0.0–0.2)

## 2023-03-18 LAB — GLUCOSE, CAPILLARY
Glucose-Capillary: 179 mg/dL — ABNORMAL HIGH (ref 70–99)
Glucose-Capillary: 258 mg/dL — ABNORMAL HIGH (ref 70–99)
Glucose-Capillary: 304 mg/dL — ABNORMAL HIGH (ref 70–99)
Glucose-Capillary: 210 mg/dL — ABNORMAL HIGH (ref 70–99)

## 2023-03-18 LAB — HEMOGLOBIN A1C
Hgb A1c MFr Bld: 6.7 % — ABNORMAL HIGH (ref 4.8–5.6)
Mean Plasma Glucose: 145.59 mg/dL

## 2023-03-18 LAB — MAGNESIUM: Magnesium: 2.1 mg/dL (ref 1.7–2.4)

## 2023-03-18 MED ORDER — TRAZODONE HCL 50 MG PO TABS
75.0000 mg | ORAL_TABLET | Freq: Every day | ORAL | Status: DC
  Administered 2023-03-18 – 2023-03-31 (×14): 75 mg via ORAL
  Filled 2023-03-18 (×14): qty 2

## 2023-03-18 MED ORDER — INSULIN ASPART 100 UNIT/ML IJ SOLN
0.0000 [IU] | Freq: Three times a day (TID) | INTRAMUSCULAR | Status: DC
Start: 2023-03-18 — End: 2023-04-01
  Administered 2023-03-18: 15 [IU] via SUBCUTANEOUS
  Administered 2023-03-19: 4 [IU] via SUBCUTANEOUS
  Administered 2023-03-19 (×2): 7 [IU] via SUBCUTANEOUS
  Administered 2023-03-20 (×2): 4 [IU] via SUBCUTANEOUS
  Administered 2023-03-20 – 2023-03-21 (×3): 7 [IU] via SUBCUTANEOUS
  Administered 2023-03-21: 4 [IU] via SUBCUTANEOUS
  Administered 2023-03-22: 5 [IU] via SUBCUTANEOUS
  Administered 2023-03-22 – 2023-03-24 (×5): 3 [IU] via SUBCUTANEOUS
  Administered 2023-03-24 – 2023-03-25 (×2): 7 [IU] via SUBCUTANEOUS
  Administered 2023-03-25: 3 [IU] via SUBCUTANEOUS
  Administered 2023-03-25: 4 [IU] via SUBCUTANEOUS
  Administered 2023-03-26: 7 [IU] via SUBCUTANEOUS
  Administered 2023-03-26: 3 [IU] via SUBCUTANEOUS
  Administered 2023-03-27 – 2023-03-28 (×4): 4 [IU] via SUBCUTANEOUS
  Administered 2023-03-29 (×2): 3 [IU] via SUBCUTANEOUS
  Administered 2023-03-29: 4 [IU] via SUBCUTANEOUS
  Administered 2023-03-30: 11 [IU] via SUBCUTANEOUS
  Administered 2023-03-31 (×2): 3 [IU] via SUBCUTANEOUS
  Administered 2023-04-01: 7 [IU] via SUBCUTANEOUS

## 2023-03-18 NOTE — Progress Notes (Signed)
Progress Note    Mario [name]   [id number]  DOB: [birth year]  DOA: 03/17/2023     1 PCP: [name]  Initial CC: SOB  Hospital Course: Mario [name] is a 73 yo male with PMH COPD, OSA, lung cancer (with radiation tx), chronic hypoxia on home O2 4L, depression/anxiety, hx PE on Eliquis, morbid obesity, HTN, OSA on CPAP who presented with worsening shortness of breath and cough.  He endorsed sick contacts at home with respiratory infections. CT angio chest was performed which was negative for PE and showed chronic airspace disease with some progression in the right lower lobe concerning for acute infection. He was afebrile with mild leukocytosis, 12.8 on admission. Respiratory swabs were positive for RSV.  He was started on nebs and steroids. Given right lower lobe opacities he was also started on antibiotics.  Interval History:  Feeling about the same.  Congested but breathing is more comfortable today.  Just tired and rundown as expected.  Assessment and Plan:  Acute on chronic hypoxic respiratory failure CAP RSV infection -Presented with shortness of breath, cough, some sputum and worsening SpO2 despite home O2 -Did endorse sick contacts at home with similar symptoms - RVP swab positive for RSV -CT angio chest concerning for some right lower lobe airspace disease.  In setting of leukocytosis and history of lung cancer, agree with continuing antibiotics for now - continue nebs and steroids also - wean O2 as able     COPD exacerbation Chronic hypoxic respiratory failure 4 to 5 L oxygen at baseline -Severe exacerbation in setting of pneumonia and RSV infection. - continue solumedrol and nebs - continue O2    Hyperkalemia - resolved  -Elevated potassium 5.2 without any EKG change on admission - s/p insulin, dextrose, lokelma, Ca gluc - trend BMP   History of lung cancer-status post radiation chemotherapy Patient reported history of lung [facility].  Unable to retrieve any records on the chart.  Patient reported that he has been following oncology every 3 months and most recent CT scan in November 2024 showed no evidence of recurrent lung cancer. 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   Generalized anxiety disorder -Continue BuSpar 15 mg daily.  Continue Cymbalta 60 mg twice daily - resume trazodone for sleep at night    DMII - Holding metformin in acute setting - A1c 6.7% - Continue SSI and CBG monitoring   Insomnia - Continue melatonin.  Holding trazodone    BPH - Continue Flomax   Old records reviewed in assessment of this patient  Antimicrobials: Rocephin 03/17/2023 >> current Doxycycline 03/17/2023 >> current  DVT prophylaxis:  SCDs Start: 03/17/23 0421 Place TED hose Start: 03/17/23 0421 apixaban (ELIQUIS) tablet 5 mg   Code Status:   Code Status: Full Code  Mobility Assessment (Last 72 Hours)     Mobility Assessment   No documentation.           Barriers to discharge: none Disposition Plan:  Home HH orders placed: TBD Status is: Inpt  Objective: Blood pressure (!) 141/83, pulse (!) 107, temperature 98.5 F (36.9 C), temperature source Oral, resp. rate (!) 27, height 6\' 2"  (1.88 m), weight (!) 141.3 kg, SpO2 92%.  Examination:  Physical Exam Constitutional:      General: He is not  in acute distress.    Appearance: Normal appearance.  HENT:     Head: Normocephalic and atraumatic.     Mouth/Throat:     Mouth: Mucous membranes are moist.  Eyes:     Extraocular Movements: Extraocular movements intact.  Cardiovascular:     Rate and Rhythm: Normal rate and regular rhythm.  Pulmonary:     Effort: Pulmonary effort is normal. No respiratory distress.     Breath sounds: No wheezing.     Comments: Coarse breath  sounds bilaterally  Abdominal:     General: Bowel sounds are normal. There is no distension.     Palpations: Abdomen is soft.     Tenderness: There is no abdominal tenderness.  Musculoskeletal:        General: Normal range of motion.     Cervical back: Normal range of motion and neck supple.  Skin:    General: Skin is warm and dry.  Neurological:     General: No focal deficit present.     Mental Status: He is alert.  Psychiatric:        Mood and Affect: Mood normal.        Behavior: Behavior normal.      Consultants:    Procedures:    Data Reviewed: Results for orders placed or performed during the hospital encounter of 03/17/23 (from the past 24 hours)  Glucose, capillary     Status: Abnormal   Collection Time: 03/17/23  4:37 PM  Result Value Ref Range   Glucose-Capillary 246 (H) 70 - 99 mg/dL   Comment 1 Document in Chart   Glucose, capillary     Status: Abnormal   Collection Time: 03/17/23  9:32 PM  Result Value Ref Range   Glucose-Capillary 216 (H) 70 - 99 mg/dL  Comprehensive metabolic panel     Status: Abnormal   Collection Time: 03/18/23  3:23 AM  Result Value Ref Range   Sodium 134 (L) 135 - 145 mmol/L   Potassium 4.5 3.5 - 5.1 mmol/L   Chloride 99 98 - 111 mmol/L   CO2 25 22 - 32 mmol/L   Glucose, Bld 179 (H) 70 - 99 mg/dL   BUN 21 8 - 23 mg/dL   Creatinine, Ser 2.95 0.61 - 1.24 mg/dL   Calcium 9.0 8.9 - 62.1 mg/dL   Total Protein 7.4 6.5 - 8.1 g/dL   Albumin 3.7 3.5 - 5.0 g/dL   AST 21 15 - 41 U/L   ALT 20 0 - 44 U/L   Alkaline Phosphatase 80 38 - 126 U/L   Total Bilirubin 0.2 0.0 - 1.2 mg/dL   GFR, Estimated >30 >86 mL/min   Anion gap 10 5 - 15  Hemoglobin A1c     Status: Abnormal   Collection Time: 03/18/23  3:23 AM  Result Value Ref Range   Hgb A1c MFr Bld 6.7 (H) 4.8 - 5.6 %   Mean Plasma Glucose 145.59 mg/dL  CBC with Differential/Platelet     Status: Abnormal   Collection Time: 03/18/23  3:23 AM  Result Value Ref Range   WBC 16.7 (H) 4.0  - 10.5 K/uL   RBC 4.73 4.22 - 5.81 MIL/uL   Hemoglobin 14.5 13.0 - 17.0 g/dL   HCT 57.8 46.9 - 62.9 %   MCV 94.1 80.0 - 100.0 fL   MCH 30.7 26.0 - 34.0 pg   MCHC 32.6 30.0 - 36.0 g/dL   RDW 52.8 41.3 - 24.4 %   Platelets 393 150 -  400 K/uL   nRBC 0.0 0.0 - 0.2 %   Neutrophils Relative % 84 %   Neutro Abs 14.1 (H) 1.7 - 7.7 K/uL   Lymphocytes Relative 7 %   Lymphs Abs 1.1 0.7 - 4.0 K/uL   Monocytes Relative 8 %   Monocytes Absolute 1.3 (H) 0.1 - 1.0 K/uL   Eosinophils Relative 0 %   Eosinophils Absolute 0.1 0.0 - 0.5 K/uL   Basophils Relative 0 %   Basophils Absolute 0.0 0.0 - 0.1 K/uL   Immature Granulocytes 1 %   Abs Immature Granulocytes 0.10 (H) 0.00 - 0.07 K/uL  Magnesium     Status: None   Collection Time: 03/18/23  3:23 AM  Result Value Ref Range   Magnesium 2.1 1.7 - 2.4 mg/dL  Glucose, capillary     Status: Abnormal   Collection Time: 03/18/23  7:45 AM  Result Value Ref Range   Glucose-Capillary 179 (H) 70 - 99 mg/dL   Comment 1 Notify RN    Comment 2 Document in Chart   Glucose, capillary     Status: Abnormal   Collection Time: 03/18/23 11:25 AM  Result Value Ref Range   Glucose-Capillary 258 (H) 70 - 99 mg/dL   Comment 1 Notify RN    Comment 2 Document in Chart     I have reviewed pertinent nursing notes, vitals, labs, and images as necessary. I have ordered labwork to follow up on as indicated.  I have reviewed the last notes from staff over past 24 hours. I have discussed patient's care plan and test results with nursing staff, CM/SW, and other staff as appropriate.  Time spent: Greater than 50% of the 55 minute visit was spent in counseling/coordination of care for the patient as laid out in the A&P.   LOS: 1 day   Lewie Chamber, MD [facility] 03/18/2023, 1:04 PM

## 2023-03-18 NOTE — Plan of Care (Signed)
  Problem: Nutritional: Goal: Maintenance of adequate nutrition will improve Outcome: Progressing   Problem: Tissue Perfusion: Goal: Adequacy of tissue perfusion will improve Outcome: Progressing   Problem: Respiratory: Goal: Ability to maintain a clear airway will improve Outcome: Progressing   Problem: Education: Goal: Knowledge of General Education information will improve Description: Including pain rating scale, medication(s)/side effects and non-pharmacologic comfort measures Outcome: Progressing   Problem: Clinical Measurements: Goal: Cardiovascular complication will be avoided Outcome: Progressing   Problem: Nutrition: Goal: Adequate nutrition will be maintained Outcome: Progressing

## 2023-03-19 DIAGNOSIS — J441 Chronic obstructive pulmonary disease with (acute) exacerbation: Secondary | ICD-10-CM | POA: Diagnosis not present

## 2023-03-19 DIAGNOSIS — J121 Respiratory syncytial virus pneumonia: Secondary | ICD-10-CM | POA: Diagnosis not present

## 2023-03-19 DIAGNOSIS — J9621 Acute and chronic respiratory failure with hypoxia: Secondary | ICD-10-CM | POA: Diagnosis not present

## 2023-03-19 DIAGNOSIS — J189 Pneumonia, unspecified organism: Secondary | ICD-10-CM | POA: Diagnosis not present

## 2023-03-19 LAB — CBC WITH DIFFERENTIAL/PLATELET
Abs Immature Granulocytes: 0.06 10*3/uL (ref 0.00–0.07)
Basophils Absolute: 0 10*3/uL (ref 0.0–0.1)
Basophils Relative: 0 %
Eosinophils Absolute: 0 10*3/uL (ref 0.0–0.5)
Eosinophils Relative: 0 %
HCT: 44.6 % (ref 39.0–52.0)
Hemoglobin: 14.5 g/dL (ref 13.0–17.0)
Immature Granulocytes: 0 %
Lymphocytes Relative: 9 %
Lymphs Abs: 1.2 10*3/uL (ref 0.7–4.0)
MCH: 30.8 pg (ref 26.0–34.0)
MCHC: 32.5 g/dL (ref 30.0–36.0)
MCV: 94.7 fL (ref 80.0–100.0)
Monocytes Absolute: 1 10*3/uL (ref 0.1–1.0)
Monocytes Relative: 8 %
Neutro Abs: 11.1 10*3/uL — ABNORMAL HIGH (ref 1.7–7.7)
Neutrophils Relative %: 83 %
Platelets: 305 10*3/uL (ref 150–400)
RBC: 4.71 MIL/uL (ref 4.22–5.81)
RDW: 13.3 % (ref 11.5–15.5)
WBC: 13.4 10*3/uL — ABNORMAL HIGH (ref 4.0–10.5)
nRBC: 0 % (ref 0.0–0.2)

## 2023-03-19 LAB — GLUCOSE, CAPILLARY
Glucose-Capillary: 194 mg/dL — ABNORMAL HIGH (ref 70–99)
Glucose-Capillary: 233 mg/dL — ABNORMAL HIGH (ref 70–99)
Glucose-Capillary: 234 mg/dL — ABNORMAL HIGH (ref 70–99)
Glucose-Capillary: 232 mg/dL — ABNORMAL HIGH (ref 70–99)

## 2023-03-19 LAB — COMPREHENSIVE METABOLIC PANEL
ALT: 18 U/L (ref 0–44)
AST: 24 U/L (ref 15–41)
Albumin: 3.6 g/dL (ref 3.5–5.0)
Alkaline Phosphatase: 70 U/L (ref 38–126)
Anion gap: 12 (ref 5–15)
BUN: 24 mg/dL — ABNORMAL HIGH (ref 8–23)
CO2: 26 mmol/L (ref 22–32)
Calcium: 9.1 mg/dL (ref 8.9–10.3)
Chloride: 100 mmol/L (ref 98–111)
Creatinine, Ser: 0.78 mg/dL (ref 0.61–1.24)
GFR, Estimated: >60 mL/min (ref >60)
Glucose, Bld: 169 mg/dL — ABNORMAL HIGH (ref 70–99)
Potassium: 4.7 mmol/L (ref 3.5–5.1)
Sodium: 138 mmol/L (ref 135–145)
Total Bilirubin: 0.6 mg/dL (ref 0.0–1.2)
Total Protein: 7.1 g/dL (ref 6.5–8.1)

## 2023-03-19 LAB — MAGNESIUM: Magnesium: 2.1 mg/dL (ref 1.7–2.4)

## 2023-03-19 MED ORDER — HYDRALAZINE HCL 25 MG PO TABS: 25.0000 mg | ORAL_TABLET | ORAL | Status: DC | PRN

## 2023-03-19 MED ORDER — LABETALOL HCL 5 MG/ML IV SOLN
10.0000 mg | INTRAVENOUS | Status: DC | PRN
  Administered 2023-03-19 (×2): 10 mg via INTRAVENOUS
  Filled 2023-03-19 (×2): qty 4

## 2023-03-19 NOTE — Progress Notes (Signed)
   03/19/23 1930  BiPAP/CPAP/SIPAP  BiPAP/CPAP/SIPAP Pt Type Adult  Reason BIPAP/CPAP not in use Non-compliant  BiPAP/CPAP /SiPAP Vitals  Pulse Rate (!) 105  Resp 20  SpO2 95 %  MEWS Score/Color  MEWS Score 1  MEWS Score Color [place]

## 2023-03-19 NOTE — Progress Notes (Signed)
   03/19/23 0100  BiPAP/CPAP/SIPAP  BiPAP/CPAP/SIPAP Pt Type Adult  Reason BIPAP/CPAP not in use Non-compliant  BiPAP/CPAP /SiPAP Vitals  Pulse Rate (!) 103  Resp (!) 21  BP (!) 153/81  SpO2 94 %  MEWS Score/Color  MEWS Score 2  MEWS Score Color Yellow

## 2023-03-19 NOTE — Progress Notes (Signed)
Progress Note    Mario [name]   [id number]  DOB: 05/16/1950  DOA: 03/17/2023     2 [facility]CP: [name]  Initial CC: SOB  Hospital Course: Mario [name] with PMH COPD, OSA, lung cancer (with radiation tx), chronic hypoxia on home O2 4L, depression/anxiety, hx PE on Eliquis, morbid obesity, HTN, OSA on CPAP who presented with worsening shortness of breath and cough.  He endorsed sick contacts at home with respiratory infections. CT angio chest was performed which was negative for PE and showed chronic airspace disease with some progression in the right lower lobe concerning for acute infection. He was afebrile with mild leukocytosis, 12.8 on admission. Respiratory swabs were positive for RSV.  He was started on nebs and steroids. Given right lower lobe opacities he was also started on antibiotics.  Interval History:  Feeling slightly better he says.  Still on more oxygen than home demand.  Assessment and Plan:  Acute on chronic hypoxic respiratory failure CAP RSV infection -Presented with shortness of breath, cough, some sputum and worsening SpO2 despite home O2 -Did endorse sick contacts at home with similar symptoms - RVP swab positive for RSV -CT angio chest concerning for some right lower lobe airspace disease.  In setting of leukocytosis and history of lung cancer, agree with continuing antibiotics for now - continue nebs and steroids also - wean O2 as able     COPD exacerbation Chronic hypoxic respiratory failure 4 to 5 L oxygen at baseline -Severe exacerbation in setting of pneumonia and RSV infection. - continue solumedrol and nebs - continue O2    Hyperkalemia - resolved  -Elevated potassium 5.2 without any EKG change on admission - s/p insulin, dextrose, lokelma, Ca gluc - trend BMP   History of lung cancer-status post radiation chemotherapy Patient reported history of lung [facility].  Unable to retrieve any  records on the chart.  Patient reported that he has been following oncology every 3 months and most recent CT scan in November 2024 showed no evidence of recurrent lung cancer. 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   Generalized anxiety disorder -Continue BuSpar 15 mg daily.  Continue Cymbalta 60 mg twice daily - resume trazodone for sleep at night    DMII - Holding metformin in acute setting - A1c 6.7% - Continue SSI and CBG monitoring   Insomnia - Continue melatonin.  Holding trazodone    BPH - Continue Flomax   Old records reviewed in assessment of this patient  Antimicrobials: Rocephin 03/17/2023 >> current Doxycycline 03/17/2023 >> current  DVT prophylaxis:  SCDs Start: 03/17/23 0421 Place TED hose Start: 03/17/23 0421 apixaban (ELIQUIS) tablet 5 mg   Code Status:   Code Status: Full Code  Mobility Assessment (Last 72 Hours)     Mobility Assessment   No documentation.           Barriers to discharge: none Disposition Plan:  Home HH orders placed: TBD Status is: Inpt  Objective: Blood pressure (!) 159/107, pulse 97, temperature 97.7 F (36.5 C), temperature source Oral, resp. rate 19, height 6\' 2"  (1.88 m), weight (!) 141.3 kg, SpO2 95%.  Examination:  Physical Exam Constitutional:      General: He is not in acute distress.    Appearance: Normal  appearance.  HENT:     Head: Normocephalic and atraumatic.     Mouth/Throat:     Mouth: Mucous membranes are moist.  Eyes:     Extraocular Movements: Extraocular movements intact.  Cardiovascular:     Rate and Rhythm: Normal rate and regular rhythm.  Pulmonary:     Effort: Pulmonary effort is normal. No respiratory distress.     Breath sounds: No wheezing.     Comments: Coarse breath sounds bilaterally  Abdominal:     General:  Bowel sounds are normal. There is no distension.     Palpations: Abdomen is soft.     Tenderness: There is no abdominal tenderness.  Musculoskeletal:        General: Normal range of motion.     Cervical back: Normal range of motion and neck supple.  Skin:    General: Skin is warm and dry.  Neurological:     General: No focal deficit present.     Mental Status: He is alert.  Psychiatric:        Mood and Affect: Mood normal.        Behavior: Behavior normal.      Consultants:    Procedures:    Data Reviewed: Results for orders placed or performed during the hospital encounter of 03/17/23 (from the past 24 hours)  Glucose, capillary     Status: Abnormal   Collection Time: 03/18/23  3:07 PM  Result Value Ref Range   Glucose-Capillary 304 (H) 70 - 99 mg/dL   Comment 1 Notify RN    Comment 2 Document in Chart   Glucose, capillary     Status: Abnormal   Collection Time: 03/18/23  9:58 PM  Result Value Ref Range   Glucose-Capillary 210 (H) 70 - 99 mg/dL   Comment 1 Notify RN    Comment 2 Document in Chart   Comprehensive metabolic panel     Status: Abnormal   Collection Time: 03/19/23  2:56 AM  Result Value Ref Range   Sodium 138 135 - 145 mmol/L   Potassium 4.7 3.5 - 5.1 mmol/L   Chloride 100 98 - 111 mmol/L   CO2 26 22 - 32 mmol/L   Glucose, Bld 169 (H) 70 - 99 mg/dL   BUN 24 (H) 8 - 23 mg/dL   Creatinine, Ser 1.61 0.61 - 1.24 mg/dL   Calcium 9.1 8.9 - 09.6 mg/dL   Total Protein 7.1 6.5 - 8.1 g/dL   Albumin 3.6 3.5 - 5.0 g/dL   AST 24 15 - 41 U/L   ALT 18 0 - 44 U/L   Alkaline Phosphatase 70 38 - 126 U/L   Total Bilirubin 0.6 0.0 - 1.2 mg/dL   GFR, Estimated >04 >54 mL/min   Anion gap 12 5 - 15  CBC with Differential/Platelet     Status: Abnormal   Collection Time: 03/19/23  2:56 AM  Result Value Ref Range   WBC 13.4 (H) 4.0 - 10.5 K/uL   RBC 4.71 4.22 - 5.81 MIL/uL   Hemoglobin 14.5 13.0 - 17.0 g/dL   HCT 09.8 11.9 - 14.7 %   MCV 94.7 80.0 - 100.0 fL   MCH  30.8 26.0 - 34.0 pg   MCHC 32.5 30.0 - 36.0 g/dL   RDW 82.9 56.2 - 13.0 %   Platelets 305 150 - 400 K/uL   nRBC 0.0 0.0 - 0.2 %   Neutrophils Relative % 83 %   Neutro Abs 11.1 (H) 1.7 - 7.7 K/uL  Lymphocytes Relative 9 %   Lymphs Abs 1.2 0.7 - 4.0 K/uL   Monocytes Relative 8 %   Monocytes Absolute 1.0 0.1 - 1.0 K/uL   Eosinophils Relative 0 %   Eosinophils Absolute 0.0 0.0 - 0.5 K/uL   Basophils Relative 0 %   Basophils Absolute 0.0 0.0 - 0.1 K/uL   Immature Granulocytes 0 %   Abs Immature Granulocytes 0.06 0.00 - 0.07 K/uL  Magnesium     Status: None   Collection Time: 03/19/23  2:56 AM  Result Value Ref Range   Magnesium 2.1 1.7 - 2.4 mg/dL  Glucose, capillary     Status: Abnormal   Collection Time: 03/19/23  7:37 AM  Result Value Ref Range   Glucose-Capillary 194 (H) 70 - 99 mg/dL   Comment 1 Notify RN    Comment 2 Document in Chart   Glucose, capillary     Status: Abnormal   Collection Time: 03/19/23 11:48 AM  Result Value Ref Range   Glucose-Capillary 233 (H) 70 - 99 mg/dL   Comment 1 Notify RN    Comment 2 Document in Chart     I have reviewed pertinent nursing notes, vitals, labs, and images as necessary. I have ordered labwork to follow up on as indicated.  I have reviewed the last notes from staff over past 24 hours. I have discussed patient's care plan and test results with nursing staff, CM/SW, and other staff as appropriate.  Time spent: Greater than 50% of the 55 minute visit was spent in counseling/coordination of care for the patient as laid out in the A&P.   LOS: 2 days   Lewie Chamber, MD [facility] 03/19/2023, 12:58 PM

## 2023-03-19 NOTE — Plan of Care (Signed)
  Problem: Skin Integrity: Goal: Risk for impaired skin integrity will decrease Outcome: Progressing   Problem: Tissue Perfusion: Goal: Adequacy of tissue perfusion will improve Outcome: Progressing   Problem: Respiratory: Goal: Ability to maintain a clear airway will improve Outcome: Progressing   Problem: Clinical Measurements: Goal: Will remain free from infection Outcome: Progressing   Problem: Nutrition: Goal: Adequate nutrition will be maintained Outcome: Progressing   Problem: Skin Integrity: Goal: Risk for impaired skin integrity will decrease Outcome: Progressing

## 2023-03-20 DIAGNOSIS — J189 Pneumonia, unspecified organism: Secondary | ICD-10-CM | POA: Diagnosis not present

## 2023-03-20 DIAGNOSIS — J9621 Acute and chronic respiratory failure with hypoxia: Secondary | ICD-10-CM | POA: Diagnosis not present

## 2023-03-20 DIAGNOSIS — J121 Respiratory syncytial virus pneumonia: Secondary | ICD-10-CM | POA: Diagnosis not present

## 2023-03-20 DIAGNOSIS — J441 Chronic obstructive pulmonary disease with (acute) exacerbation: Secondary | ICD-10-CM | POA: Diagnosis not present

## 2023-03-20 LAB — GLUCOSE, CAPILLARY
Glucose-Capillary: 186 mg/dL — ABNORMAL HIGH (ref 70–99)
Glucose-Capillary: 221 mg/dL — ABNORMAL HIGH (ref 70–99)
Glucose-Capillary: 163 mg/dL — ABNORMAL HIGH (ref 70–99)
Glucose-Capillary: 244 mg/dL — ABNORMAL HIGH (ref 70–99)

## 2023-03-20 LAB — LEGIONELLA PNEUMOPHILA SEROGP 1 UR AG: L. pneumophila Serogp 1 Ur Ag: NEGATIVE

## 2023-03-20 MED ORDER — DOXYCYCLINE HYCLATE 100 MG PO TABS
100.0000 mg | ORAL_TABLET | Freq: Two times a day (BID) | ORAL | Status: AC
  Administered 2023-03-20 – 2023-03-23 (×7): 100 mg via ORAL
  Filled 2023-03-20 (×7): qty 1

## 2023-03-20 MED ORDER — BUPROPION HCL ER (XL) 300 MG PO TB24
300.0000 mg | ORAL_TABLET | Freq: Every morning | ORAL | Status: DC
Start: 2023-03-21 — End: 2023-04-01
  Administered 2023-03-21 – 2023-04-01 (×12): 300 mg via ORAL
  Filled 2023-03-20 (×12): qty 1

## 2023-03-20 MED ORDER — OXCARBAZEPINE 300 MG PO TABS
600.0000 mg | ORAL_TABLET | Freq: Three times a day (TID) | ORAL | Status: DC
Start: 2023-03-20 — End: 2023-04-01
  Administered 2023-03-20 – 2023-04-01 (×36): 600 mg via ORAL
  Filled 2023-03-20 (×15): qty 2
  Filled 2023-03-20: qty 4
  Filled 2023-03-20 (×24): qty 2

## 2023-03-20 MED ORDER — GABAPENTIN 100 MG PO CAPS
100.0000 mg | ORAL_CAPSULE | Freq: Three times a day (TID) | ORAL | Status: DC
Start: 2023-03-20 — End: 2023-04-01
  Administered 2023-03-20 – 2023-04-01 (×36): 100 mg via ORAL
  Filled 2023-03-20 (×36): qty 1

## 2023-03-20 NOTE — Progress Notes (Signed)
   03/20/23 1932  BiPAP/CPAP/SIPAP  BiPAP/CPAP/SIPAP Pt Type Adult  Reason BIPAP/CPAP not in use Non-compliant   Pt refused cpap tonight.  Pt remains on 7L [place] / hfnc.

## 2023-03-20 NOTE — Plan of Care (Signed)
  Problem: Skin Integrity: Goal: Risk for impaired skin integrity will decrease Outcome: Progressing   Problem: Tissue Perfusion: Goal: Adequacy of tissue perfusion will improve Outcome: Progressing   Problem: Respiratory: Goal: Ability to maintain a clear airway will improve Outcome: Progressing Goal: Levels of oxygenation will improve Outcome: Progressing Goal: Ability to maintain adequate ventilation will improve Outcome: Progressing   Problem: Clinical Measurements: Goal: Ability to maintain clinical measurements within normal limits will improve Outcome: Progressing Goal: Diagnostic test results will improve Outcome: Progressing   Problem: Activity: Goal: Risk for activity intolerance will decrease Outcome: Progressing   Problem: Nutrition: Goal: Adequate nutrition will be maintained Outcome: Progressing   Problem: Coping: Goal: Level of anxiety will decrease Outcome: Progressing   Problem: Elimination: Goal: Will not experience complications related to urinary retention Outcome: Progressing   Problem: Pain Managment: Goal: General experience of comfort will improve and/or be controlled Outcome: Progressing   Problem: Safety: Goal: Ability to remain free from injury will improve Outcome: Progressing   Problem: Skin Integrity: Goal: Risk for impaired skin integrity will decrease Outcome: Progressing

## 2023-03-20 NOTE — Progress Notes (Signed)
Progress Note    Mario [name]   [id number]  DOB: 1950/08/05  DOA: 03/17/2023     3 [facility]CP: [name]  Initial CC: SOB  Hospital Course: Mario [name] is a 73 yo male with PMH COPD, OSA, lung cancer (with radiation tx), chronic hypoxia on home O2 4L, depression/anxiety, hx PE on Eliquis, morbid obesity, HTN, OSA on CPAP who presented with worsening shortness of breath and cough.  He endorsed sick contacts at home with respiratory infections. CT angio chest was performed which was negative for PE and showed chronic airspace disease with some progression in the right lower lobe concerning for acute infection. He was afebrile with mild leukocytosis, 12.8 on admission. Respiratory swabs were positive for RSV.  He was started on nebs and steroids. Given right lower lobe opacities he was also started on antibiotics.  Interval History:  Still feeling okay and eating breakfast when seen but oxygen requirements still have been slowly increasing.  Assessment and Plan:  Acute on chronic hypoxic respiratory failure CAP RSV infection -Presented with shortness of breath, cough, some sputum and worsening SpO2 despite home O2 -Did endorse sick contacts at home with similar symptoms - RVP swab positive for RSV -CT angio chest concerning for some right lower lobe airspace disease.  In setting of leukocytosis and history of lung cancer, agree with continuing antibiotics for now; complete 7 days for doxy and Rocephin  - continue nebs and steroids also - wean O2 as able     COPD exacerbation Chronic hypoxic respiratory failure 4 to 5 L oxygen at baseline -Severe exacerbation in setting of pneumonia and RSV infection. - continue solumedrol and nebs - continue O2    Hyperkalemia - resolved  -Elevated potassium 5.2 without any EKG change on admission - s/p insulin, dextrose, lokelma, Ca gluc - trend BMP   History of lung cancer-status post radiation chemotherapy Patient reported  history of [facility].  Unable to retrieve any records on the chart.  Patient reported that he has been following oncology every 3 months and most recent CT scan in November 2024 showed no evidence of recurrent lung cancer. 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   Generalized anxiety disorder -Continue BuSpar 15 mg daily.  Continue Cymbalta 60 mg twice daily - resume trazodone for sleep at night    DMII - Holding metformin in acute setting - A1c 6.7% - Continue SSI and CBG monitoring   Insomnia - Continue melatonin and trazodone    BPH - Continue Flomax   Old records reviewed in assessment of this patient  Antimicrobials: Rocephin 03/17/2023 >> current Doxycycline 03/17/2023 >> current  DVT prophylaxis:  SCDs Start: 03/17/23 0421 Place TED hose Start: 03/17/23 0421 apixaban (ELIQUIS) tablet 5 mg   Code Status:   Code Status: Full Code  Mobility Assessment (Last 72 Hours)     Mobility Assessment   No documentation.           Barriers to discharge: none Disposition Plan:  Home HH orders placed: TBD Status is: Inpt  Objective: Blood pressure (!) 152/65, pulse 91, temperature (!) 96.7 F (35.9 C), temperature source Axillary, resp. rate (!) 22, height 6\' 2"  (1.88 m), weight (!) 141.3 kg, SpO2 94%.  Examination:  Physical Exam Constitutional:  General: He is not in acute distress.    Appearance: Normal appearance.  HENT:     Head: Normocephalic and atraumatic.     Mouth/Throat:     Mouth: Mucous membranes are moist.  Eyes:     Extraocular Movements: Extraocular movements intact.  Cardiovascular:     Rate and Rhythm: Normal rate and regular rhythm.  Pulmonary:     Effort: Pulmonary effort is normal. No respiratory distress.     Breath sounds: No  wheezing.     Comments: Coarse breath sounds bilaterally (have been mild and stable) Abdominal:     General: Bowel sounds are normal. There is no distension.     Palpations: Abdomen is soft.     Tenderness: There is no abdominal tenderness.  Musculoskeletal:        General: Normal range of motion.     Cervical back: Normal range of motion and neck supple.  Skin:    General: Skin is warm and dry.  Neurological:     General: No focal deficit present.     Mental Status: He is alert.  Psychiatric:        Mood and Affect: Mood normal.        Behavior: Behavior normal.      Consultants:    Procedures:    Data Reviewed: Results for orders placed or performed during the hospital encounter of 03/17/23 (from the past 24 hours)  Glucose, capillary     Status: Abnormal   Collection Time: 03/19/23 11:48 AM  Result Value Ref Range   Glucose-Capillary 233 (H) 70 - 99 mg/dL   Comment 1 Notify RN    Comment 2 Document in Chart   Glucose, capillary     Status: Abnormal   Collection Time: 03/19/23  4:24 PM  Result Value Ref Range   Glucose-Capillary 234 (H) 70 - 99 mg/dL   Comment 1 Notify RN    Comment 2 Document in Chart   Glucose, capillary     Status: Abnormal   Collection Time: 03/19/23 10:02 PM  Result Value Ref Range   Glucose-Capillary 232 (H) 70 - 99 mg/dL  Glucose, capillary     Status: Abnormal   Collection Time: 03/20/23  7:33 AM  Result Value Ref Range   Glucose-Capillary 186 (H) 70 - 99 mg/dL    I have reviewed pertinent nursing notes, vitals, labs, and images as necessary. I have ordered labwork to follow up on as indicated.  I have reviewed the last notes from staff over past 24 hours. I have discussed patient's care plan and test results with nursing staff, CM/SW, and other staff as appropriate.  Time spent: Greater than 50% of the 55 minute visit was spent in counseling/coordination of care for the patient as laid out in the A&P.   LOS: 3 days   Lewie Chamber,  MD [facility] 03/20/2023, 11:30 AM

## 2023-03-21 DIAGNOSIS — J9621 Acute and chronic respiratory failure with hypoxia: Secondary | ICD-10-CM | POA: Diagnosis not present

## 2023-03-21 DIAGNOSIS — J121 Respiratory syncytial virus pneumonia: Secondary | ICD-10-CM | POA: Diagnosis not present

## 2023-03-21 DIAGNOSIS — J441 Chronic obstructive pulmonary disease with (acute) exacerbation: Secondary | ICD-10-CM | POA: Diagnosis not present

## 2023-03-21 DIAGNOSIS — J189 Pneumonia, unspecified organism: Secondary | ICD-10-CM | POA: Diagnosis not present

## 2023-03-21 LAB — BASIC METABOLIC PANEL
Anion gap: 9 (ref 5–15)
BUN: 26 mg/dL — ABNORMAL HIGH (ref 8–23)
CO2: 24 mmol/L (ref 22–32)
Calcium: 8.7 mg/dL — ABNORMAL LOW (ref 8.9–10.3)
Chloride: 101 mmol/L (ref 98–111)
Creatinine, Ser: 0.55 mg/dL — ABNORMAL LOW (ref 0.61–1.24)
GFR, Estimated: >60 mL/min (ref >60)
Glucose, Bld: 219 mg/dL — ABNORMAL HIGH (ref 70–99)
Potassium: 4.3 mmol/L (ref 3.5–5.1)
Sodium: 134 mmol/L — ABNORMAL LOW (ref 135–145)

## 2023-03-21 LAB — GLUCOSE, CAPILLARY
Glucose-Capillary: 217 mg/dL — ABNORMAL HIGH (ref 70–99)
Glucose-Capillary: 228 mg/dL — ABNORMAL HIGH (ref 70–99)
Glucose-Capillary: 184 mg/dL — ABNORMAL HIGH (ref 70–99)

## 2023-03-21 LAB — CBC WITH DIFFERENTIAL/PLATELET
Abs Immature Granulocytes: 0.09 10*3/uL — ABNORMAL HIGH (ref 0.00–0.07)
Basophils Absolute: 0 10*3/uL (ref 0.0–0.1)
Basophils Relative: 0 %
Eosinophils Absolute: 0 10*3/uL (ref 0.0–0.5)
Eosinophils Relative: 0 %
HCT: 44.8 % (ref 39.0–52.0)
Hemoglobin: 14.8 g/dL (ref 13.0–17.0)
Immature Granulocytes: 1 %
Lymphocytes Relative: 12 %
Lymphs Abs: 1.9 10*3/uL (ref 0.7–4.0)
MCH: 30.5 pg (ref 26.0–34.0)
MCHC: 33 g/dL (ref 30.0–36.0)
MCV: 92.2 fL (ref 80.0–100.0)
Monocytes Absolute: 0.9 10*3/uL (ref 0.1–1.0)
Monocytes Relative: 6 %
Neutro Abs: 12.5 10*3/uL — ABNORMAL HIGH (ref 1.7–7.7)
Neutrophils Relative %: 81 %
Platelets: 317 10*3/uL (ref 150–400)
RBC: 4.86 MIL/uL (ref 4.22–5.81)
RDW: 13.2 % (ref 11.5–15.5)
WBC: 15.4 10*3/uL — ABNORMAL HIGH (ref 4.0–10.5)
nRBC: 0 % (ref 0.0–0.2)

## 2023-03-21 LAB — MAGNESIUM: Magnesium: 2.1 mg/dL (ref 1.7–2.4)

## 2023-03-21 MED ORDER — INSULIN GLARGINE-YFGN 100 UNIT/ML ~~LOC~~ SOLN
5.0000 [IU] | Freq: Every day | SUBCUTANEOUS | Status: DC
Start: 2023-03-21 — End: 2023-04-01
  Administered 2023-03-21 – 2023-04-01 (×12): 5 [IU] via SUBCUTANEOUS
  Filled 2023-03-21 (×13): qty 0.05

## 2023-03-21 NOTE — Progress Notes (Signed)
Report called and given to [facility] on 4-E. Pt to be transferred via WC.

## 2023-03-21 NOTE — Plan of Care (Signed)
  Problem: Education: Goal: Ability to describe self-care measures that may prevent or decrease complications (Diabetes Survival Skills Education) will improve Outcome: Progressing   

## 2023-03-21 NOTE — Progress Notes (Signed)
Pt refuses CPAP 

## 2023-03-21 NOTE — Progress Notes (Signed)
Patient refusing to adhere to safety precautions, continues to set off bed alarm. Attempted to explain rationale and patient refused teaching.

## 2023-03-21 NOTE — Progress Notes (Signed)
Progress Note    Mario [name]   RUE:[id number]  DOB: Dec 12, 1950  DOA: 03/17/2023     4 PCP: [name]  Initial CC: SOB  Hospital Course: Mario [name] is a 73 yo male with PMH COPD, OSA, lung cancer (with radiation tx), chronic hypoxia on home O2 4L, depression/anxiety, hx PE on Eliquis, morbid obesity, HTN, OSA on CPAP who presented with worsening shortness of breath and cough.  He endorsed sick contacts at home with respiratory infections. CT angio chest was performed which was negative for PE and showed chronic airspace disease with some progression in the right lower lobe concerning for acute infection. He was afebrile with mild leukocytosis, 12.8 on admission. Respiratory swabs were positive for RSV.  He was started on nebs and steroids. Given right lower lobe opacities he was also started on antibiotics.  Interval History:  Eating breakfast this morning.  No events overnight.  Oxygen has been weaned some since yesterday.  Down to 6 L [place] high flow.   Assessment and Plan:  Acute on chronic hypoxic respiratory failure CAP RSV infection -Presented with shortness of breath, cough, some sputum and worsening SpO2 despite home O2 -Did endorse sick contacts at home with similar symptoms - RVP swab positive for RSV -CT angio chest concerning for some right lower lobe airspace disease.  In setting of leukocytosis and history of lung cancer, agree with continuing antibiotics for now; complete 7 days for doxy and Rocephin  - continue nebs and steroids also - wean O2 as able     COPD exacerbation Chronic hypoxic respiratory failure 4 to 5 L oxygen at baseline -Severe exacerbation in setting of pneumonia and RSV infection. - continue solumedrol and nebs - continue O2    Hyperkalemia - resolved  -Elevated potassium 5.2 without any EKG change on admission - s/p insulin, dextrose, lokelma, Ca gluc - trend BMP   History of lung cancer-status post radiation  chemotherapy Patient reported history of lung cancer following [facility].  Unable to retrieve any records on the chart.  Patient reported that he has been following oncology every 3 months and most recent CT scan in November 2024 showed no evidence of recurrent lung cancer. 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   Generalized anxiety disorder -Continue BuSpar 15 mg daily.  Continue Cymbalta 60 mg twice daily - resume trazodone for sleep at night    DMII - Holding metformin in acute setting - A1c 6.7% - Continue SSI and CBG monitoring   Insomnia - Continue melatonin and trazodone    BPH - Continue Flomax   Old records reviewed in assessment of this patient  Antimicrobials: Rocephin 03/17/2023 >> current Doxycycline 03/17/2023 >> current  DVT prophylaxis:  SCDs Start: 03/17/23 0421 Place TED hose Start: 03/17/23 0421 apixaban (ELIQUIS) tablet 5 mg   Code Status:   Code Status: Full Code  Mobility Assessment (Last 72 Hours)     Mobility Assessment   No documentation.           Barriers to discharge: none Disposition Plan:  Home HH orders placed: TBD Status is: Inpt  Objective: Blood pressure (!) 169/101, pulse (!) 104, temperature 97.7 F (36.5 C), temperature source Oral, resp. rate (!) 24, height 6\' 2"  (1.88 m), weight (!) 141.3 kg, SpO2 97%.  Examination:  Physical Exam Constitutional:      General: He is not in acute distress.    Appearance: Normal appearance.  HENT:     Head: Normocephalic and atraumatic.     Mouth/Throat:     Mouth: Mucous membranes are moist.  Eyes:     Extraocular Movements: Extraocular movements intact.  Cardiovascular:     Rate and Rhythm: Normal rate and regular rhythm.  Pulmonary:     Effort: Pulmonary effort is normal. No respiratory distress.      Breath sounds: No wheezing.     Comments: Coarse breath sounds bilaterally (have been mild and stable) Abdominal:     General: Bowel sounds are normal. There is no distension.     Palpations: Abdomen is soft.     Tenderness: There is no abdominal tenderness.  Musculoskeletal:        General: Normal range of motion.     Cervical back: Normal range of motion and neck supple.  Skin:    General: Skin is warm and dry.  Neurological:     General: No focal deficit present.     Mental Status: He is alert.  Psychiatric:        Mood and Affect: Mood normal.        Behavior: Behavior normal.      Consultants:    Procedures:    Data Reviewed: Results for orders placed or performed during the hospital encounter of 03/17/23 (from the past 24 hours)  Glucose, capillary     Status: Abnormal   Collection Time: 03/20/23 11:35 AM  Result Value Ref Range   Glucose-Capillary 221 (H) 70 - 99 mg/dL  Glucose, capillary     Status: Abnormal   Collection Time: 03/20/23  4:20 PM  Result Value Ref Range   Glucose-Capillary 163 (H) 70 - 99 mg/dL  Glucose, capillary     Status: Abnormal   Collection Time: 03/20/23  9:15 PM  Result Value Ref Range   Glucose-Capillary 244 (H) 70 - 99 mg/dL   Comment 1 Notify RN    Comment 2 Document in Chart   Basic metabolic panel     Status: Abnormal   Collection Time: 03/21/23  2:53 AM  Result Value Ref Range   Sodium 134 (L) 135 - 145 mmol/L   Potassium 4.3 3.5 - 5.1 mmol/L   Chloride 101 98 - 111 mmol/L   CO2 24 22 - 32 mmol/L   Glucose, Bld 219 (H) 70 - 99 mg/dL   BUN 26 (H) 8 - 23 mg/dL   Creatinine, Ser 1.61 (L) 0.61 - 1.24 mg/dL   Calcium 8.7 (L) 8.9 - 10.3 mg/dL   GFR, Estimated >09 >60 mL/min   Anion gap 9 5 - 15  CBC with Differential/Platelet     Status: Abnormal   Collection Time: 03/21/23  2:53 AM  Result Value Ref Range   WBC 15.4 (H) 4.0 - 10.5 K/uL   RBC 4.86 4.22 - 5.81 MIL/uL   Hemoglobin 14.8 13.0 - 17.0 g/dL   HCT 45.4 09.8 -  11.9 %   MCV 92.2 80.0 - 100.0 fL   MCH 30.5 26.0 - 34.0 pg   MCHC 33.0 30.0 - 36.0 g/dL   RDW 14.7 82.9 - 56.2 %   Platelets 317 150 - 400 K/uL   nRBC 0.0 0.0 - 0.2 %   Neutrophils Relative % 81 %   Neutro Abs 12.5 (H) 1.7 - 7.7 K/uL   Lymphocytes Relative 12 %   Lymphs  Abs 1.9 0.7 - 4.0 K/uL   Monocytes Relative 6 %   Monocytes Absolute 0.9 0.1 - 1.0 K/uL   Eosinophils Relative 0 %   Eosinophils Absolute 0.0 0.0 - 0.5 K/uL   Basophils Relative 0 %   Basophils Absolute 0.0 0.0 - 0.1 K/uL   Immature Granulocytes 1 %   Abs Immature Granulocytes 0.09 (H) 0.00 - 0.07 K/uL  Magnesium     Status: None   Collection Time: 03/21/23  2:53 AM  Result Value Ref Range   Magnesium 2.1 1.7 - 2.4 mg/dL  Glucose, capillary     Status: Abnormal   Collection Time: 03/21/23  7:48 AM  Result Value Ref Range   Glucose-Capillary 217 (H) 70 - 99 mg/dL    I have reviewed pertinent nursing notes, vitals, labs, and images as necessary. I have ordered labwork to follow up on as indicated.  I have reviewed the last notes from staff over past 24 hours. I have discussed patient's care plan and test results with nursing staff, CM/SW, and other staff as appropriate.  Time spent: Greater than 50% of the 55 minute visit was spent in counseling/coordination of care for the patient as laid out in the A&P.   LOS: 4 days   Lewie Chamber, MD [facility] 03/21/2023, 10:48 AM

## 2023-03-21 NOTE — Inpatient Diabetes Management (Signed)
Inpatient Diabetes Program Recommendations  AACE/ADA: New Consensus Statement on Inpatient Glycemic Control   Target Ranges:  Prepandial:   less than 140 mg/dL      Peak postprandial:   less than 180 mg/dL (1-2 hours)      Critically ill patients:  140 - 180 mg/dL    Latest Reference Range & Units 03/20/23 07:33 03/20/23 11:35 03/20/23 16:20 03/20/23 21:15 03/21/23 07:48 03/21/23 11:41  Glucose-Capillary 70 - 99 mg/dL 161 (H) 096 (H) 045 (H) 244 (H) 217 (H) 228 (H)   Review of Glycemic Control  Diabetes history: DM2 Outpatient Diabetes medications: Metformin 850 mg QPM Current orders for Inpatient glycemic control: Novolog 0-20 units TID with meals, Novolog 0-5 units at bedtime; Solumedrol 40 mg Q12H  Inpatient Diabetes Program Recommendations:    Insulin: CBGs today 217 and 228 mg/dl.   If steroids are continued as ordered, please consider ordering Semglee 5 units Q24H and Novolog 3 units TID with meals for meal coverage if patient eats at least 50% of meals.  Thanks, Orlando Penner, RN, MSN, CDCES Diabetes Coordinator Inpatient Diabetes Program [phone] (Team Pager from 8am to 5pm)

## 2023-03-22 DIAGNOSIS — J205 Acute bronchitis due to respiratory syncytial virus: Secondary | ICD-10-CM | POA: Diagnosis not present

## 2023-03-22 LAB — CULTURE, BLOOD (ROUTINE X 2)
Culture: NO GROWTH
Culture: NO GROWTH

## 2023-03-22 LAB — GLUCOSE, CAPILLARY
Glucose-Capillary: 246 mg/dL — ABNORMAL HIGH (ref 70–99)
Glucose-Capillary: 175 mg/dL — ABNORMAL HIGH (ref 70–99)
Glucose-Capillary: 141 mg/dL — ABNORMAL HIGH (ref 70–99)
Glucose-Capillary: 118 mg/dL — ABNORMAL HIGH (ref 70–99)
Glucose-Capillary: 186 mg/dL — ABNORMAL HIGH (ref 70–99)

## 2023-03-22 LAB — BRAIN NATRIURETIC PEPTIDE: B Natriuretic Peptide: 33.4 pg/mL (ref 0.0–100.0)

## 2023-03-22 LAB — TROPONIN I (HIGH SENSITIVITY)
Troponin I (High Sensitivity): 5 ng/L (ref <18)
Troponin I (High Sensitivity): 6 ng/L (ref <18)

## 2023-03-22 MED ORDER — LEVALBUTEROL HCL 0.63 MG/3ML IN NEBU
0.6300 mg | INHALATION_SOLUTION | Freq: Two times a day (BID) | RESPIRATORY_TRACT | Status: DC
  Administered 2023-03-22 – 2023-03-24 (×4): 0.63 mg via RESPIRATORY_TRACT
  Filled 2023-03-22 (×3): qty 3

## 2023-03-22 MED ORDER — IPRATROPIUM BROMIDE 0.02 % IN SOLN
0.5000 mg | Freq: Two times a day (BID) | RESPIRATORY_TRACT | Status: DC
  Administered 2023-03-22 – 2023-03-24 (×4): 0.5 mg via RESPIRATORY_TRACT
  Filled 2023-03-22 (×4): qty 2.5

## 2023-03-22 NOTE — Progress Notes (Signed)
Mobility Specialist - Progress Note   03/22/23 1116  Oxygen Therapy  SpO2 (!) 73 %  O2 Device Nasal Cannula  O2 Flow Rate (L/min) 6 L/min  Patient Activity (if Appropriate) Ambulating  Mobility  Activity Ambulated independently in hallway  Level of Assistance Independent  Assistive Device None  Distance Ambulated (ft) 230 ft  Activity Response Tolerated well  Mobility Referral Yes  Mobility visit 1 Mobility  Mobility Specialist Start Time (ACUTE ONLY) 1058  Mobility Specialist Stop Time (ACUTE ONLY) 1115  Mobility Specialist Time Calculation (min) (ACUTE ONLY) 17 min   Pt received in bed and agreeable to mobility. During ambulation, pt desat to 73%. Encouraged a standing rest break with pursed lip breaths bringing SpO2 back up to 89%. No complaints during session. Pt to EOB after session with all needs met.    Pre-mobility: 91% SpO2 (6L [place]) During mobility: 118 HR, 73-89% SpO2 (6L [name]) Post-mobility: 110 HR, 94% SPO2 (6L [place])  Chief Technology Officer

## 2023-03-22 NOTE — Progress Notes (Signed)
   03/22/23 1937  BiPAP/CPAP/SIPAP  BiPAP/CPAP/SIPAP Pt Type Adult  Reason BIPAP/CPAP not in use Non-compliant (Patient refused cpap qhs.  Patient states he is unable to tolerate our full face mask.  Patient advised he is able to bring his mask in from home to use with our machine.)  BiPAP/CPAP /SiPAP Vitals  Resp (!) 21  SpO2 92 %  Bilateral Breath Sounds Expiratory wheezes;Diminished  MEWS Score/Color  MEWS Score 1  MEWS Score Color Green

## 2023-03-22 NOTE — TOC Initial Note (Signed)
Transition of Care [facility]) - Initial/Assessment Note    Patient Details  Name: Mario [name] MRN: [id number] Date of Birth: 12/06/1950  [facility] CM/SW Contact:    Lanier Clam, RN Phone Number: 03/22/2023, 3:54 PM  Clinical Narrative:  d/c plan home.                 Expected Discharge Plan: Home/Self Care Barriers to Discharge: Continued Medical Work up   Patient Goals and CMS Choice            Expected Discharge Plan and Services                                              Prior Living Arrangements/Services                       Activities of Daily Living   ADL Screening (condition at time of admission) Independently performs ADLs?: Yes (appropriate for developmental age) Is the patient deaf or have difficulty hearing?: No Does the patient have difficulty seeing, even when wearing glasses/contacts?: Yes ("Sometimes") Does the patient have difficulty concentrating, remembering, or making decisions?: No ("Not usually")  Permission Sought/Granted                  Emotional Assessment              Admission diagnosis:  RSV bronchitis [J20.5] Acute hypoxic respiratory failure (HCC) [J96.01] Acute hypoxic on chronic hypercapnic respiratory failure (HCC) [J96.01, J96.12] Patient Active Problem List   Diagnosis Date Noted   RSV bronchitis 03/22/2023   Community acquired pneumonia 03/17/2023   History of lung cancer 03/17/2023   Acute hypoxic on chronic hypercapnic respiratory failure (HCC) 03/17/2023   RSV (respiratory syncytial virus pneumonia) 03/17/2023   Ankylosing spondylitis lumbar region (HCC) 09/25/2022   Vasculitis (HCC) 07/02/2022   Chronic pain 07/02/2022   history of Pulmonary embolism (HCC) 07/02/2022   Anxiety    Acute on chronic hypoxic respiratory failure (HCC) 11/02/2021   Adjustment disorder with depressed mood 10/09/2021   BPH (benign prostatic hyperplasia) 09/23/2021   Fracture of ribs, two,  left, sequela 08/24/2021   Metastatic carcinoma (HCC) 08/07/2021   Non-small cell lung cancer, left (HCC) 08/07/2021   Chronic respiratory failure with hypoxia, on home O2 therapy (HCC) - 4 L/min 08/07/2021   Rib pain 08/03/2021   History of falling 07/29/2021   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   DM2 (  diabetes mellitus, type 2) (HCC) 07/16/2017   HLA B27 (HLA B27 positive) 07/16/2017   Hyperkalemia    Hypoxia    Dyspnea 05/23/2014   Malingering 01/21/2013   Tobacco abuse 01/11/2013   Obesity, unspecified 01/11/2013   Hypertension associated with diabetes (HCC) 01/10/2013   PCP:  [name] Pharmacy:   [facility] - [place], [place] - [address] [place] [address] Phone: [phone] Fax: [phone]     Social Drivers of Health (SDOH) Social History: SDOH Screenings   Food Insecurity: No Food Insecurity (03/17/2023)  Housing: Low Risk  (03/17/2023)  Transportation Needs: No Transportation Needs (03/17/2023)   Utilities: Not At Risk (03/17/2023)  Financial Resource Strain: Low Risk  (09/23/2020)  Social Connections: Socially Isolated (03/17/2023)  Stress: No Stress Concern Present (09/23/2020)  Tobacco Use: Medium Risk (03/17/2023)   SDOH Interventions:     Readmission Risk Interventions    11/03/2021   11:50 AM 08/23/2021   11:48 AM 08/05/2021   11:02 AM  Readmission Risk Prevention Plan  Transportation Screening Complete Complete Complete  Medication Review Oceanographer) Complete Complete Complete  PCP or Specialist appointment within 3-5 days of discharge Complete Complete Complete  HRI or Home Care Consult Complete Complete Complete  SW Recovery Care/Counseling Consult Complete Complete Complete  Palliative Care Screening Not Applicable Not Applicable Not Applicable  Skilled Nursing Facility Not Applicable Not Applicable Not Applicable

## 2023-03-22 NOTE — Progress Notes (Signed)
PROGRESS NOTE    Mario [name]  KZS:[id number] DOB: Sep 17, 1950 DOA: 03/17/2023 PCP: [name]  Chief Complaint  Patient presents with   Shortness of Breath    Brief Narrative:   Mr. Mario [name] is Mario [name] 73 yo male with PMH COPD, OSA, lung cancer (with radiation tx), chronic hypoxia on home O2 4L, depression/anxiety, hx PE on Eliquis, morbid obesity, HTN, OSA on CPAP who presented with worsening shortness of breath and cough.  Found to have RSV infection.  Treating for RSV with presumed superimposed bacterial pneumonia as well as COPD exacerbation with steroids.   Assessment & Plan:   Principal Problem:   Community acquired pneumonia Active Problems:   COPD with acute exacerbation (HCC)   Acute on chronic hypoxic respiratory failure (HCC)   RSV (respiratory syncytial virus pneumonia)   Hyperkalemia   Essential hypertension   History of lung cancer   History of pulmonary embolus (PE)  Chest Pain He notes chronic CP for years.  This episode was brief, about 3 min in duration, occurred  at rest.  No clear exacerbating/alleviating factors.  It's resolved by the time I saw him at bedside.  He notes chronic CP, lasting up to 30 min that would be worth outpatient follow up pending this inpatient workup. Will get EKG and troponin.  Acute on chronic hypoxic respiratory failure CAP RSV infection -Presented with shortness of breath, cough, some sputum and worsening SpO2 despite home O2 -Did endorse sick contacts at home with similar symptoms -positive for RSV -CT chest with chronic airspace disease at bases, progressed area in RLL since 11/03/2022 (superimposed infectious pneumonia or progression of chronic disease)  - in setting of leukocytosis and history of lung cancer, agree with continuing antibiotics for now; complete 7 days for doxy and Rocephin  - continue nebs and steroids also - wean O2 as able     COPD exacerbation Chronic hypoxic respiratory failure 4 to 5 L oxygen  at baseline -Severe exacerbation in setting of pneumonia and RSV infection. - continue solumedrol and nebs - continue O2    Hyperkalemia  resolved    History of lung cancer-status post radiation chemotherapy Patient reported history of lung [facility].  Unable to retrieve any records on the chart.  Patient reported that he has been following oncology every 3 months and most recent CT scan in November 2024 showed no evidence of recurrent lung cancer. 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   Generalized anxiety disorder -Continue BuSpar 15 mg daily.  Continue Cymbalta 60 mg twice daily - resume trazodone for sleep at night    DMII - Holding metformin in acute setting - A1c 6.7% - Continue SSI + basal and CBG monitoring   Insomnia - Continue melatonin and trazodone    BPH - Continue Flomax    DVT prophylaxis: eliquis Code Status: full Family Communication: none Disposition:   Status is: Inpatient Remains inpatient appropriate because: need for inpatient care   Consultants:  none  Procedures:  none  Antimicrobials:  Anti-infectives (From admission, onward)    Start     Dose/Rate Route Frequency Ordered Stop   03/20/23 2200  doxycycline (VIBRA-TABS) tablet 100 mg        100 mg Oral Every 12 hours 03/20/23 1133 03/24/23 0959   03/17/23 0500  doxycycline (VIBRAMYCIN) 100 mg in sodium chloride 0.9 % 250 mL IVPB        100 mg 125 mL/hr over 120 Minutes Intravenous Every 12 hours 03/17/23 0420 03/20/23 0737   03/17/23 0430  cefTRIAXone (ROCEPHIN) 2 g in sodium chloride 0.9 % 100 mL IVPB        2 g 200 mL/hr over 30 Minutes Intravenous Every 24 hours 03/17/23 0420 03/24/23 0429       Subjective: CP resolved, substernal, pressure/achy, lasted 3 min Started when he was at  rest.  Has had it on/off for years, can sometimes be as long as 30 min. Improving overall, feels about 75% he estimates.   Objective: Vitals:   03/22/23 0800 03/22/23 0823 03/22/23 0936 03/22/23 0937  BP: (!) 142/76  (!) 128/55 (!) 128/55  Pulse: 98     Resp:      Temp: 98 F (36.7 C)     TempSrc: Oral     SpO2: 94% 100%    Weight:      Height:        Intake/Output Summary (Last 24 hours) at 03/22/2023 0953 Last data filed at 03/22/2023 0700 Gross per 24 hour  Intake 1040 ml  Output 1450 ml  Net -410 ml   Filed Weights   03/17/23 0108 03/17/23 1147  Weight: (!) 139.7 kg (!) 141.3 kg    Examination:  General exam: Appears calm and comfortable  Respiratory system: bibasilar crackles, scattered wheezing Cardiovascular system: distant, regular rate Central nervous system: Alert and oriented. No focal neurological deficits. Extremities: no LEE   Data Reviewed: I have personally reviewed following labs and imaging studies  CBC: Recent Labs  Lab 03/17/23 0103 03/18/23 0323 03/19/23 0256 03/21/23 0253  WBC 12.8* 16.7* 13.4* 15.4*  NEUTROABS 11.1* 14.1* 11.1* 12.5*  HGB 14.3 14.5 14.5 14.8  HCT 44.3 44.5 44.6 44.8  MCV 93.1 94.1 94.7 92.2  PLT 312 393 305 317    Basic Metabolic Panel: Recent Labs  Lab 03/17/23 0232 03/18/23 0323 03/19/23 0256 03/21/23 0253  NA 134* 134* 138 134*  K 5.2* 4.5 4.7 4.3  CL 100 99 100 101  CO2 23 25 26 24   GLUCOSE 229* 179* 169* 219*  BUN 18 21 24* 26*  CREATININE 1.14 0.89 0.78 0.55*  CALCIUM 8.9 9.0 9.1 8.7*  MG  --  2.1 2.1 2.1    GFR: Estimated Creatinine Clearance: 126.7 mL/min (Seon Gaertner) (by C-G formula based on SCr of 0.55 mg/dL (L)).  Liver Function Tests: Recent Labs  Lab 03/18/23 0323 03/19/23 0256  AST 21 24  ALT 20 18  ALKPHOS 80 70  BILITOT 0.2 0.6  PROT 7.4 7.1  ALBUMIN 3.7 3.6    CBG: Recent Labs  Lab 03/21/23 0748 03/21/23 1141 03/21/23 1610 03/21/23 2126 03/22/23 0750  GLUCAP 217* 228* 184*  246* 175*     Recent Results (from the past 240 hours)  Resp panel by RT-PCR (RSV, Flu Dennette Faulconer&B, Covid) Anterior Nasal Swab     Status: Abnormal   Collection Time: 03/17/23  1:03 AM   Specimen: Anterior Nasal Swab  Result Value Ref Range Status   SARS Coronavirus 2 by RT PCR NEGATIVE NEGATIVE Final    Comment: (NOTE) SARS-CoV-2 target nucleic acids are NOT DETECTED.  The SARS-CoV-2 RNA is generally detectable in upper respiratory specimens during the acute phase of infection. The lowest concentration of SARS-CoV-2 viral copies this assay can detect is 138 copies/mL. Marisel Tostenson negative result does not preclude SARS-Cov-2  infection and should not be used as the sole basis for treatment or other patient management decisions. Lucila Klecka negative result may occur with  improper specimen collection/handling, submission of specimen other than nasopharyngeal swab, presence of viral mutation(s) within the areas targeted by this assay, and inadequate number of viral copies(<138 copies/mL). Jacqualyn Sedgwick negative result must be combined with clinical observations, patient history, and epidemiological information. The expected result is Negative.  Fact Sheet for Patients:  BloggerCourse.com  Fact Sheet for Healthcare Providers:  SeriousBroker.it  This test is no t yet approved or cleared by the [place] FDA and  has been authorized for detection and/or diagnosis of SARS-CoV-2 by FDA under an Emergency Use Authorization (EUA). This EUA will remain  in effect (meaning this test can be used) for the duration of the COVID-19 declaration under Section 564(b)(1) of the Act, 21 U.S.C.section 360bbb-3(b)(1), unless the authorization is terminated  or revoked sooner.       Influenza Denice Cardon by PCR NEGATIVE NEGATIVE Final   Influenza B by PCR NEGATIVE NEGATIVE Final    Comment: (NOTE) The Xpert Xpress SARS-CoV-2/FLU/RSV plus assay is intended as an aid in the diagnosis of  influenza from Nasopharyngeal swab specimens and should not be used as Jenaveve Fenstermaker sole basis for treatment. Nasal washings and aspirates are unacceptable for Xpert Xpress SARS-CoV-2/FLU/RSV testing.  Fact Sheet for Patients: BloggerCourse.com  Fact Sheet for Healthcare Providers: SeriousBroker.it  This test is not yet approved or cleared by the [place] FDA and has been authorized for detection and/or diagnosis of SARS-CoV-2 by FDA under an Emergency Use Authorization (EUA). This EUA will remain in effect (meaning this test can be used) for the duration of the COVID-19 declaration under Section 564(b)(1) of the Act, 21 U.S.C. section 360bbb-3(b)(1), unless the authorization is terminated or revoked.     Resp Syncytial Virus by PCR POSITIVE (Dashae Wilcher) NEGATIVE Final    Comment: (NOTE) Fact Sheet for Patients: BloggerCourse.com  Fact Sheet for Healthcare Providers: SeriousBroker.it  This test is not yet approved or cleared by the [place] FDA and has been authorized for detection and/or diagnosis of SARS-CoV-2 by FDA under an Emergency Use Authorization (EUA). This EUA will remain in effect (meaning this test can be used) for the duration of the COVID-19 declaration under Section 564(b)(1) of the Act, 21 U.S.C. section 360bbb-3(b)(1), unless the authorization is terminated or revoked.  Performed at [facility], [address] [place], [place] [address]   Culture, blood (Routine X 2) w Reflex to ID Panel     Status: None (Preliminary result)   Collection Time: 03/17/23  5:15 AM   Specimen: BLOOD  Result Value Ref Range Status   Specimen Description   Final    BLOOD LEFT ANTECUBITAL Performed at [facility], [address] [place], [place] [address]    Special Requests   Final    BOTTLES DRAWN AEROBIC AND ANAEROBIC Blood Culture results may  not be optimal due to an inadequate volume of blood received in culture bottles Performed at [facility], [address] [place], [place] [address]    Culture   Final    NO GROWTH 4 DAYS Performed at [facility], [address] [place], [place] [address]    Report Status PENDING  Incomplete  Culture, blood (Routine X 2) w Reflex to ID Panel     Status: None (Preliminary result)   Collection Time: 03/17/23  5:30 AM   Specimen: BLOOD RIGHT  HAND  Result Value Ref Range Status   Specimen Description   Final    BLOOD RIGHT HAND Performed at [facility], [address] [place], [place] [address]    Special Requests   Final    BOTTLES DRAWN AEROBIC AND ANAEROBIC Blood Culture results may not be optimal due to an inadequate volume of blood received in culture bottles Performed at [facility], [address] [place], [place] [address]    Culture   Final    NO GROWTH 4 DAYS Performed at [facility], [address], [place], [place] [address]    Report Status PENDING  Incomplete  MRSA Next Gen by PCR, Nasal     Status: Abnormal   Collection Time: 03/17/23 12:14 PM   Specimen: Nasal Mucosa; Nasal Swab  Result Value Ref Range Status   MRSA by PCR Next Gen DETECTED (Koi Zangara) NOT DETECTED Final    Comment: RESULT CALLED TO, READ BACK BY AND VERIFIED WITH: CLAPP, B RN @ 1426 03/17/23. GILBERTL (NOTE) The GeneXpert MRSA Assay (FDA approved for NASAL specimens only), is one component of Janeliz Prestwood comprehensive MRSA colonization surveillance program. It is not intended to diagnose MRSA infection nor to guide or monitor treatment for MRSA infections. Test performance is not FDA approved in patients less than 56 years old. Performed at [facility], [address], [place], [place] [address]          Radiology Studies: No results found.      Scheduled Meds:  apixaban  5 mg Oral BID   buPROPion  300 mg  Oral q morning   busPIRone  15 mg Oral Daily   cholecalciferol  1,000 Units Oral Daily   cyclobenzaprine  10 mg Oral QHS   diltiazem  180 mg Oral Daily   doxycycline  100 mg Oral Q12H   DULoxetine  60 mg Oral BID   folic acid  1 mg Oral Daily   gabapentin  100 mg Oral TID   guaiFENesin  600 mg Oral BID   insulin aspart  0-20 Units Subcutaneous TID WC   insulin aspart  0-5 Units Subcutaneous QHS   insulin glargine-yfgn  5 Units Subcutaneous Daily   ipratropium  0.5 mg Nebulization BID   levalbuterol  0.63 mg Nebulization BID   melatonin  6 mg Oral QHS   OXcarbazepine  600 mg Oral TID   sodium chloride flush  3 mL Intravenous Q12H   sodium chloride flush  3 mL Intravenous Q12H   tamsulosin  0.8 mg Oral Daily   traZODone  75 mg Oral QHS   Continuous Infusions:  cefTRIAXone (ROCEPHIN)  IV 2 g (03/22/23 0432)     LOS: 5 days    Time spent: 35 min    Lacretia Nicks, MD [facility]   To contact the attending provider between 7A-7P or the covering provider during after hours 7P-7A, please log into the web site [email or web address] and access using universal [place] password for that web site. If you do not have the password, please call the hospital operator.  03/22/2023, 9:53 AM

## 2023-03-23 ENCOUNTER — Inpatient Hospital Stay (HOSPITAL_COMMUNITY): Payer: No Typology Code available for payment source

## 2023-03-23 DIAGNOSIS — J205 Acute bronchitis due to respiratory syncytial virus: Secondary | ICD-10-CM

## 2023-03-23 DIAGNOSIS — J9601 Acute respiratory failure with hypoxia: Secondary | ICD-10-CM | POA: Diagnosis not present

## 2023-03-23 LAB — COMPREHENSIVE METABOLIC PANEL
ALT: 33 U/L (ref 0–44)
AST: 17 U/L (ref 15–41)
Albumin: 3.2 g/dL — ABNORMAL LOW (ref 3.5–5.0)
Alkaline Phosphatase: 64 U/L (ref 38–126)
Anion gap: 8 (ref 5–15)
BUN: 28 mg/dL — ABNORMAL HIGH (ref 8–23)
CO2: 25 mmol/L (ref 22–32)
Calcium: 8.5 mg/dL — ABNORMAL LOW (ref 8.9–10.3)
Chloride: 101 mmol/L (ref 98–111)
Creatinine, Ser: 0.84 mg/dL (ref 0.61–1.24)
GFR, Estimated: >60 mL/min (ref >60)
Glucose, Bld: 156 mg/dL — ABNORMAL HIGH (ref 70–99)
Potassium: 3.8 mmol/L (ref 3.5–5.1)
Sodium: 134 mmol/L — ABNORMAL LOW (ref 135–145)
Total Bilirubin: 0.4 mg/dL (ref 0.0–1.2)
Total Protein: 6.5 g/dL (ref 6.5–8.1)

## 2023-03-23 LAB — GLUCOSE, CAPILLARY
Glucose-Capillary: 114 mg/dL — ABNORMAL HIGH (ref 70–99)
Glucose-Capillary: 131 mg/dL — ABNORMAL HIGH (ref 70–99)
Glucose-Capillary: 124 mg/dL — ABNORMAL HIGH (ref 70–99)
Glucose-Capillary: 268 mg/dL — ABNORMAL HIGH (ref 70–99)

## 2023-03-23 LAB — CBC WITH DIFFERENTIAL/PLATELET
Abs Immature Granulocytes: 0.17 10*3/uL — ABNORMAL HIGH (ref 0.00–0.07)
Basophils Absolute: 0.1 10*3/uL (ref 0.0–0.1)
Basophils Relative: 1 %
Eosinophils Absolute: 1.1 10*3/uL — ABNORMAL HIGH (ref 0.0–0.5)
Eosinophils Relative: 8 %
HCT: 45 % (ref 39.0–52.0)
Hemoglobin: 14.8 g/dL (ref 13.0–17.0)
Immature Granulocytes: 1 %
Lymphocytes Relative: 25 %
Lymphs Abs: 3.5 10*3/uL (ref 0.7–4.0)
MCH: 30.5 pg (ref 26.0–34.0)
MCHC: 32.9 g/dL (ref 30.0–36.0)
MCV: 92.8 fL (ref 80.0–100.0)
Monocytes Absolute: 1.2 10*3/uL — ABNORMAL HIGH (ref 0.1–1.0)
Monocytes Relative: 9 %
Neutro Abs: 7.9 10*3/uL — ABNORMAL HIGH (ref 1.7–7.7)
Neutrophils Relative %: 56 %
Platelets: 292 10*3/uL (ref 150–400)
RBC: 4.85 MIL/uL (ref 4.22–5.81)
RDW: 13.2 % (ref 11.5–15.5)
WBC: 13.9 10*3/uL — ABNORMAL HIGH (ref 4.0–10.5)
nRBC: 0 % (ref 0.0–0.2)

## 2023-03-23 LAB — PHOSPHORUS: Phosphorus: 3.6 mg/dL (ref 2.5–4.6)

## 2023-03-23 LAB — MAGNESIUM: Magnesium: 2 mg/dL (ref 1.7–2.4)

## 2023-03-23 LAB — BRAIN NATRIURETIC PEPTIDE: B Natriuretic Peptide: 32.3 pg/mL (ref 0.0–100.0)

## 2023-03-23 MED ORDER — BUDESONIDE 0.25 MG/2ML IN SUSP
0.2500 mg | Freq: Two times a day (BID) | RESPIRATORY_TRACT | Status: DC
  Administered 2023-03-23 – 2023-04-01 (×17): 0.25 mg via RESPIRATORY_TRACT
  Filled 2023-03-23 (×18): qty 2

## 2023-03-23 MED ORDER — PREDNISONE 5 MG PO TABS
30.0000 mg | ORAL_TABLET | Freq: Every day | ORAL | Status: AC
  Administered 2023-03-26 – 2023-03-28 (×3): 30 mg via ORAL
  Filled 2023-03-23 (×3): qty 2

## 2023-03-23 MED ORDER — PREDNISONE 20 MG PO TABS
20.0000 mg | ORAL_TABLET | Freq: Every day | ORAL | Status: AC
  Administered 2023-03-29 – 2023-03-31 (×3): 20 mg via ORAL
  Filled 2023-03-23 (×3): qty 1

## 2023-03-23 MED ORDER — PREDNISONE 20 MG PO TABS
40.0000 mg | ORAL_TABLET | Freq: Every day | ORAL | Status: AC
  Administered 2023-03-23 – 2023-03-25 (×3): 40 mg via ORAL
  Filled 2023-03-23 (×3): qty 2

## 2023-03-23 MED ORDER — PREDNISONE 5 MG PO TABS
10.0000 mg | ORAL_TABLET | Freq: Every day | ORAL | Status: DC
  Administered 2023-04-01: 10 mg via ORAL
  Filled 2023-03-23: qty 2

## 2023-03-23 MED ORDER — ARFORMOTEROL TARTRATE 15 MCG/2ML IN NEBU
15.0000 ug | INHALATION_SOLUTION | Freq: Two times a day (BID) | RESPIRATORY_TRACT | Status: DC
  Administered 2023-03-23 – 2023-04-01 (×17): 15 ug via RESPIRATORY_TRACT
  Filled 2023-03-23 (×18): qty 2

## 2023-03-23 NOTE — Progress Notes (Signed)
Added sterile water to 02 system- passed pressure test.

## 2023-03-23 NOTE — Progress Notes (Signed)
   03/23/23 1933  BiPAP/CPAP/SIPAP  BiPAP/CPAP/SIPAP Pt Type Adult  Reason BIPAP/CPAP not in use Non-compliant (Pt refusing cpap for the night.)  BiPAP/CPAP /SiPAP Vitals  SpO2 92 %  Bilateral Breath Sounds Clear;Diminished

## 2023-03-23 NOTE — Progress Notes (Signed)
PROGRESS NOTE    Mario [name]  ONG:[id number] DOB: [birth year] DOA: 03/17/2023 PCP: [name]  Chief Complaint  Patient presents with   Shortness of Breath    Brief Narrative:   [name] is Mario [name] 73 yo male with PMH COPD, OSA, lung cancer (with radiation tx), chronic hypoxia on home O2 4L, depression/anxiety, hx PE on Eliquis, morbid obesity, HTN, OSA on CPAP who presented with worsening shortness of breath and cough.  Found to have RSV infection.  Treating for RSV with presumed superimposed bacterial pneumonia as well as COPD exacerbation with steroids.   Assessment & Plan:   Principal Problem:   Community acquired pneumonia Active Problems:   COPD with acute exacerbation (HCC)   Acute on chronic hypoxic respiratory failure (HCC)   RSV (respiratory syncytial virus pneumonia)   Hyperkalemia   Essential hypertension   History of lung cancer   History of pulmonary embolus (PE)   RSV bronchitis  Chest Pain He notes chronic CP for years.  This episode was brief, about 3 min in duration, occurred  at rest.  No clear exacerbating/alleviating factors.  It's resolved by the time I saw him at bedside.  He notes chronic CP, lasting up to 30 min that would be worth outpatient follow up pending this inpatient workup. Negative troponin, will follow echo.  EKG with IVCD, T wave inversions in III, aVF.  Acute on chronic hypoxic respiratory failure CAP RSV infection -Presented with shortness of breath, cough, some sputum and worsening SpO2 despite home O2 -Did endorse sick contacts at home with similar symptoms -positive for RSV -CT chest with chronic airspace disease at bases, progressed area in RLL since 11/03/2022 (superimposed infectious pneumonia or progression of chronic disease)  - s/p doxy, ceftriaxone.  He notes worsening symptoms today, will continue steroids, follow CXR. - wean O2 as able     COPD exacerbation Chronic hypoxic respiratory failure 4 to 5 L oxygen at  baseline -Severe exacerbation in setting of pneumonia and RSV infection. - resume steroid taper - repeat CXR with worsening symptoms today - continue O2    Hyperkalemia  resolved    History of lung cancer-status post radiation chemotherapy Patient reported history of lung [facility].  Unable to retrieve any records on the chart.  Patient reported that he has been following oncology every 3 months and most recent CT scan in November 2024 showed no evidence of recurrent lung cancer. 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   Generalized anxiety disorder -Continue BuSpar 15 mg daily.  Continue Cymbalta 60 mg twice daily - resume trazodone for sleep at night    DMII - Holding metformin in acute setting - A1c 6.7% - Continue SSI + basal and CBG monitoring   Insomnia - Continue melatonin and trazodone    BPH - Continue Flomax    DVT prophylaxis: eliquis Code Status: full Family Communication: none Disposition:   Status is: Inpatient Remains inpatient appropriate because: need for inpatient care   Consultants:  none  Procedures:  none  Antimicrobials:  Anti-infectives (From admission, onward)    Start     Dose/Rate Route Frequency Ordered Stop   03/20/23 2200  doxycycline (VIBRA-TABS) tablet 100 mg        100 mg Oral Every 12 hours 03/20/23 1133 03/24/23  8469   03/17/23 0500  doxycycline (VIBRAMYCIN) 100 mg in sodium chloride 0.9 % 250 mL IVPB        100 mg 125 mL/hr over 120 Minutes Intravenous Every 12 hours 03/17/23 0420 03/20/23 0737   03/17/23 0430  cefTRIAXone (ROCEPHIN) 2 g in sodium chloride 0.9 % 100 mL IVPB        2 g 200 mL/hr over 30 Minutes Intravenous Every 24 hours 03/17/23 0420 03/23/23 0551       Subjective: C/o sob today  Feels worse today    Objective: Vitals:   03/23/23 0941 03/23/23 0943 03/23/23 0945 03/23/23 1139  BP:    117/67  Pulse:    91  Resp:      Temp:    98.6 F (37 C)  TempSrc:    Oral  SpO2: 92% 92% 92% 97%  Weight:      Height:        Intake/Output Summary (Last 24 hours) at 03/23/2023 1152 Last data filed at 03/23/2023 0900 Gross per 24 hour  Intake 723 ml  Output 1800 ml  Net -1077 ml   Filed Weights   03/17/23 0108 03/17/23 1147  Weight: (!) 139.7 kg (!) 141.3 kg    Examination:  General: looks more uncomfortable today Cardiovascular: RRR Lungs: expiratory wheezing, diminished, coarse cough - unlabored Neurological: Alert and oriented 3. Moves all extremities 4 with equal strength. Cranial nerves II through XII grossly intact. Extremities: No clubbing or cyanosis. No edema.   Data Reviewed: I have personally reviewed following labs and imaging studies  CBC: Recent Labs  Lab 03/17/23 0103 03/18/23 0323 03/19/23 0256 03/21/23 0253 03/23/23 0455  WBC 12.8* 16.7* 13.4* 15.4* 13.9*  NEUTROABS 11.1* 14.1* 11.1* 12.5* 7.9*  HGB 14.3 14.5 14.5 14.8 14.8  HCT 44.3 44.5 44.6 44.8 45.0  MCV 93.1 94.1 94.7 92.2 92.8  PLT 312 393 305 317 292    Basic Metabolic Panel: Recent Labs  Lab 03/17/23 0232 03/18/23 0323 03/19/23 0256 03/21/23 0253 03/23/23 0455  NA 134* 134* 138 134* 134*  K 5.2* 4.5 4.7 4.3 3.8  CL 100 99 100 101 101  CO2 23 25 26 24 25   GLUCOSE 229* 179* 169* 219* 156*  BUN 18 21 24* 26* 28*  CREATININE 1.14 0.89 0.78 0.55* 0.84  CALCIUM 8.9 9.0 9.1 8.7* 8.5*  MG  --  2.1 2.1 2.1 2.0  PHOS  --   --   --   --  3.6    GFR: Estimated Creatinine Clearance: 120.7 mL/min (by C-G formula based on SCr of 0.84 mg/dL).  Liver Function Tests: Recent Labs  Lab 03/18/23 0323 03/19/23 0256 03/23/23 0455  AST 21 24 17   ALT 20 18 33  ALKPHOS 80 70 64  BILITOT 0.2 0.6 0.4  PROT 7.4 7.1 6.5  ALBUMIN 3.7 3.6 3.2*    CBG: Recent Labs  Lab 03/22/23 1124  03/22/23 1702 03/22/23 2042 03/23/23 0741 03/23/23 1138  GLUCAP 141* 118* 186* 114* 131*     Recent Results (from the past 240 hours)  Resp panel by RT-PCR (RSV, Flu Elener Custodio&B, Covid) Anterior Nasal Swab     Status: Abnormal   Collection Time: 03/17/23  1:03 AM   Specimen: Anterior Nasal Swab  Result Value Ref Range Status   SARS Coronavirus 2 by RT PCR NEGATIVE NEGATIVE Final    Comment: (NOTE) SARS-CoV-2 target nucleic acids are NOT DETECTED.  The SARS-CoV-2 RNA is generally detectable in upper respiratory specimens during the  acute phase of infection. The lowest concentration of SARS-CoV-2 viral copies this assay can detect is 138 copies/mL. Dorthy Magnussen negative result does not preclude SARS-Cov-2 infection and should not be used as the sole basis for treatment or other patient management decisions. Phillips Goulette negative result may occur with  improper specimen collection/handling, submission of specimen other than nasopharyngeal swab, presence of viral mutation(s) within the areas targeted by this assay, and inadequate number of viral copies(<138 copies/mL). Otha Monical negative result must be combined with clinical observations, patient history, and epidemiological information. The expected result is Negative.  Fact Sheet for Patients:  BloggerCourse.com  Fact Sheet for Healthcare Providers:  SeriousBroker.it  This test is no t yet approved or cleared by the [place] FDA and  has been authorized for detection and/or diagnosis of SARS-CoV-2 by FDA under an Emergency Use Authorization (EUA). This EUA will remain  in effect (meaning this test can be used) for the duration of the COVID-19 declaration under Section 564(b)(1) of the Act, 21 U.S.C.section 360bbb-3(b)(1), unless the authorization is terminated  or revoked sooner.       Influenza Jonee Lamore by PCR NEGATIVE NEGATIVE Final   Influenza B by PCR NEGATIVE NEGATIVE Final    Comment: (NOTE) The Xpert  Xpress SARS-CoV-2/FLU/RSV plus assay is intended as an aid in the diagnosis of influenza from Nasopharyngeal swab specimens and should not be used as Kadey Mihalic sole basis for treatment. Nasal washings and aspirates are unacceptable for Xpert Xpress SARS-CoV-2/FLU/RSV testing.  Fact Sheet for Patients: BloggerCourse.com  Fact Sheet for Healthcare Providers: SeriousBroker.it  This test is not yet approved or cleared by the [place] FDA and has been authorized for detection and/or diagnosis of SARS-CoV-2 by FDA under an Emergency Use Authorization (EUA). This EUA will remain in effect (meaning this test can be used) for the duration of the COVID-19 declaration under Section 564(b)(1) of the Act, 21 U.S.C. section 360bbb-3(b)(1), unless the authorization is terminated or revoked.     Resp Syncytial Virus by PCR POSITIVE (Kristine Tiley) NEGATIVE Final    Comment: (NOTE) Fact Sheet for Patients: BloggerCourse.com  Fact Sheet for Healthcare Providers: SeriousBroker.it  This test is not yet approved or cleared by the [place] FDA and has been authorized for detection and/or diagnosis of SARS-CoV-2 by FDA under an Emergency Use Authorization (EUA). This EUA will remain in effect (meaning this test can be used) for the duration of the COVID-19 declaration under Section 564(b)(1) of the Act, 21 U.S.C. section 360bbb-3(b)(1), unless the authorization is terminated or revoked.  Performed at [facility], [address] [place], [place] [address]   Culture, blood (Routine X 2) w Reflex to ID Panel     Status: None   Collection Time: 03/17/23  5:15 AM   Specimen: BLOOD  Result Value Ref Range Status   Specimen Description   Final    BLOOD LEFT ANTECUBITAL Performed at [facility], [address], [place], [place] [address]    Special Requests   Final     BOTTLES DRAWN AEROBIC AND ANAEROBIC Blood Culture results may not be optimal due to an inadequate volume of blood received in culture bottles Performed at [facility], [address], [place], [place] [address]    Culture   Final    NO GROWTH 5 DAYS Performed at [facility], [address], [place], [place] [address]    Report Status 03/22/2023 FINAL  Final  Culture, blood (Routine X 2) w Reflex  to ID Panel     Status: None   Collection Time: 03/17/23  5:30 AM   Specimen: BLOOD RIGHT HAND  Result Value Ref Range Status   Specimen Description   Final    BLOOD RIGHT HAND Performed at [facility], [address] [place], [place] [address]    Special Requests   Final    BOTTLES DRAWN AEROBIC AND ANAEROBIC Blood Culture results may not be optimal due to an inadequate volume of blood received in culture bottles Performed at [facility], [address], [place], [place] [address]    Culture   Final    NO GROWTH 5 DAYS Performed at [facility], [address] [place], [place] [address]    Report Status 03/22/2023 FINAL  Final  MRSA Next Gen by PCR, Nasal     Status: Abnormal   Collection Time: 03/17/23 12:14 PM   Specimen: Nasal Mucosa; Nasal Swab  Result Value Ref Range Status   MRSA by PCR Next Gen DETECTED (Lilac Hoff) NOT DETECTED Final    Comment: RESULT CALLED TO, READ BACK BY AND VERIFIED WITH: CLAPP, B RN @ 1426 03/17/23. GILBERTL (NOTE) The GeneXpert MRSA Assay (FDA approved for NASAL specimens only), is one component of Kathelyn Gombos comprehensive MRSA colonization surveillance program. It is not intended to diagnose MRSA infection nor to guide or monitor treatment for MRSA infections. Test performance is not FDA approved in patients less than 60 years old. Performed at [facility], [address], [place], [place] [address]          Radiology Studies: No results found.      Scheduled  Meds:  apixaban  5 mg Oral BID   buPROPion  300 mg Oral q morning   busPIRone  15 mg Oral Daily   cholecalciferol  1,000 Units Oral Daily   cyclobenzaprine  10 mg Oral QHS   diltiazem  180 mg Oral Daily   doxycycline  100 mg Oral Q12H   DULoxetine  60 mg Oral BID   folic acid  1 mg Oral Daily   gabapentin  100 mg Oral TID   guaiFENesin  600 mg Oral BID   insulin aspart  0-20 Units Subcutaneous TID WC   insulin aspart  0-5 Units Subcutaneous QHS   insulin glargine-yfgn  5 Units Subcutaneous Daily   ipratropium  0.5 mg Nebulization BID   levalbuterol  0.63 mg Nebulization BID   melatonin  6 mg Oral QHS   OXcarbazepine  600 mg Oral TID   predniSONE  40 mg Oral Q breakfast   Followed by   [name] ON 03/26/2023] predniSONE  30 mg Oral Q breakfast   Followed by   [name] ON 03/29/2023] predniSONE  20 mg Oral Q breakfast   Followed by   [name] ON 04/01/2023] predniSONE  10 mg Oral Q breakfast   sodium chloride flush  3 mL Intravenous Q12H   sodium chloride flush  3 mL Intravenous Q12H   tamsulosin  0.8 mg Oral Daily   traZODone  75 mg Oral QHS   Continuous Infusions:     LOS: 6 days    Time spent: 35 min    Lacretia Nicks, MD [facility]   To contact the attending provider between 7A-7P or the covering provider during after hours 7P-7A, please log into the web site [email or web address] and access using universal [place] password for that web site. If you do not have the password, please  call the hospital operator.  03/23/2023, 11:52 AM

## 2023-03-23 NOTE — Progress Notes (Signed)
Mobility Specialist - Progress Note   03/23/23 1117  Mobility  Activity Ambulated independently in hallway  Level of Assistance Independent  Assistive Device None  Distance Ambulated (ft) 120 ft  Activity Response Tolerated well  Mobility Referral Yes  Mobility visit 1 Mobility  Mobility Specialist Start Time (ACUTE ONLY) 1058  Mobility Specialist Stop Time (ACUTE ONLY) 1115  Mobility Specialist Time Calculation (min) (ACUTE ONLY) 17 min   Pt received EOB and agreeable to mobility. Pt took x1 standing rest break during session d/t SOB. Pt to EOB after session with all needs met.    Pre-mobility: 94% SpO2 (6L [place]) During mobility: 115 HR, 92% SpO2 (6L [place]) Post-mobility: 93% SPO2 (6L )  Chief Technology Officer

## 2023-03-24 ENCOUNTER — Inpatient Hospital Stay (HOSPITAL_COMMUNITY): Payer: No Typology Code available for payment source

## 2023-03-24 DIAGNOSIS — R079 Chest pain, unspecified: Secondary | ICD-10-CM | POA: Diagnosis not present

## 2023-03-24 DIAGNOSIS — J9601 Acute respiratory failure with hypoxia: Secondary | ICD-10-CM | POA: Diagnosis not present

## 2023-03-24 DIAGNOSIS — J205 Acute bronchitis due to respiratory syncytial virus: Secondary | ICD-10-CM | POA: Diagnosis not present

## 2023-03-24 LAB — GLUCOSE, CAPILLARY
Glucose-Capillary: 146 mg/dL — ABNORMAL HIGH (ref 70–99)
Glucose-Capillary: 134 mg/dL — ABNORMAL HIGH (ref 70–99)
Glucose-Capillary: 208 mg/dL — ABNORMAL HIGH (ref 70–99)
Glucose-Capillary: 174 mg/dL — ABNORMAL HIGH (ref 70–99)

## 2023-03-24 LAB — ECHOCARDIOGRAM COMPLETE
AR max vel: 3.22 cm2
AV Area VTI: 3 cm2
AV Area mean vel: 3.12 cm2
AV Mean grad: 2 mm[Hg]
AV Peak grad: 3.9 mm[Hg]
Ao pk vel: 0.99 m/s
Height: 74 in
S' Lateral: 3.2 cm
Weight: 4984.16 [oz_av]

## 2023-03-24 LAB — CBC WITH DIFFERENTIAL/PLATELET
Abs Immature Granulocytes: 0.16 10*3/uL — ABNORMAL HIGH (ref 0.00–0.07)
Basophils Absolute: 0.1 10*3/uL (ref 0.0–0.1)
Basophils Relative: 0 %
Eosinophils Absolute: 0.1 10*3/uL (ref 0.0–0.5)
Eosinophils Relative: 1 %
HCT: 41.6 % (ref 39.0–52.0)
Hemoglobin: 13.8 g/dL (ref 13.0–17.0)
Immature Granulocytes: 1 %
Lymphocytes Relative: 18 %
Lymphs Abs: 2.3 10*3/uL (ref 0.7–4.0)
MCH: 30.7 pg (ref 26.0–34.0)
MCHC: 33.2 g/dL (ref 30.0–36.0)
MCV: 92.7 fL (ref 80.0–100.0)
Monocytes Absolute: 1.3 10*3/uL — ABNORMAL HIGH (ref 0.1–1.0)
Monocytes Relative: 10 %
Neutro Abs: 9.1 10*3/uL — ABNORMAL HIGH (ref 1.7–7.7)
Neutrophils Relative %: 70 %
Platelets: 297 10*3/uL (ref 150–400)
RBC: 4.49 MIL/uL (ref 4.22–5.81)
RDW: 13.1 % (ref 11.5–15.5)
WBC: 13.1 10*3/uL — ABNORMAL HIGH (ref 4.0–10.5)
nRBC: 0 % (ref 0.0–0.2)

## 2023-03-24 LAB — COMPREHENSIVE METABOLIC PANEL
ALT: 27 U/L (ref 0–44)
AST: 15 U/L (ref 15–41)
Albumin: 2.9 g/dL — ABNORMAL LOW (ref 3.5–5.0)
Alkaline Phosphatase: 59 U/L (ref 38–126)
Anion gap: 9 (ref 5–15)
BUN: 23 mg/dL (ref 8–23)
CO2: 25 mmol/L (ref 22–32)
Calcium: 8.1 mg/dL — ABNORMAL LOW (ref 8.9–10.3)
Chloride: 99 mmol/L (ref 98–111)
Creatinine, Ser: 0.85 mg/dL (ref 0.61–1.24)
GFR, Estimated: >60 mL/min (ref >60)
Glucose, Bld: 206 mg/dL — ABNORMAL HIGH (ref 70–99)
Potassium: 4 mmol/L (ref 3.5–5.1)
Sodium: 133 mmol/L — ABNORMAL LOW (ref 135–145)
Total Bilirubin: 0.3 mg/dL (ref 0.0–1.2)
Total Protein: 6.2 g/dL — ABNORMAL LOW (ref 6.5–8.1)

## 2023-03-24 LAB — EXPECTORATED SPUTUM ASSESSMENT W GRAM STAIN, RFLX TO RESP C

## 2023-03-24 LAB — MAGNESIUM: Magnesium: 2.1 mg/dL (ref 1.7–2.4)

## 2023-03-24 LAB — PHOSPHORUS: Phosphorus: 3.3 mg/dL (ref 2.5–4.6)

## 2023-03-24 MED ORDER — DOCUSATE SODIUM 100 MG PO CAPS: 100.0000 mg | ORAL_CAPSULE | Freq: Two times a day (BID) | ORAL | Status: DC

## 2023-03-24 MED ORDER — POLYETHYLENE GLYCOL 3350 17 G PO PACK
17.0000 g | PACK | Freq: Two times a day (BID) | ORAL | Status: DC
  Administered 2023-03-24 – 2023-03-25 (×2): 17 g via ORAL
  Filled 2023-03-24 (×2): qty 1

## 2023-03-24 MED ORDER — ALBUTEROL SULFATE (2.5 MG/3ML) 0.083% IN NEBU: 2.5000 mg | INHALATION_SOLUTION | RESPIRATORY_TRACT | Status: DC | PRN

## 2023-03-24 MED ORDER — SALINE SPRAY 0.65 % NA SOLN
1.0000 | NASAL | Status: DC | PRN
  Filled 2023-03-24: qty 44

## 2023-03-24 MED ORDER — IPRATROPIUM-ALBUTEROL 0.5-2.5 (3) MG/3ML IN SOLN
3.0000 mL | Freq: Four times a day (QID) | RESPIRATORY_TRACT | Status: DC
  Administered 2023-03-24 – 2023-03-28 (×15): 3 mL via RESPIRATORY_TRACT
  Filled 2023-03-24 (×17): qty 3

## 2023-03-24 NOTE — Plan of Care (Signed)
Problem: Education: Goal: Ability to describe self-care measures that may prevent or decrease complications (Diabetes Survival Skills Education) will improve Outcome: Progressing Goal: Individualized Educational Video(s) Outcome: Progressing   Problem: Coping: Goal: Ability to adjust to condition or change in health will improve Outcome: Progressing   Problem: Fluid Volume: Goal: Ability to maintain a balanced intake and output will improve Outcome: Progressing   Problem: Health Behavior/Discharge Planning: Goal: Ability to identify and utilize available resources and services will improve Outcome: Progressing Goal: Ability to manage health-related needs will improve Outcome: Progressing   Problem: Metabolic: Goal: Ability to maintain appropriate glucose levels will improve Outcome: Progressing   Problem: Nutritional: Goal: Maintenance of adequate nutrition will improve Outcome: Progressing Goal: Progress toward achieving an optimal weight will improve Outcome: Progressing   Problem: Skin Integrity: Goal: Risk for impaired skin integrity will decrease Outcome: Progressing   Problem: Tissue Perfusion: Goal: Adequacy of tissue perfusion will improve Outcome: Progressing   Problem: Education: Goal: Knowledge of disease or condition will improve Outcome: Progressing Goal: Knowledge of the prescribed therapeutic regimen will improve Outcome: Progressing Goal: Individualized Educational Video(s) Outcome: Progressing   Problem: Activity: Goal: Ability to tolerate increased activity will improve Outcome: Progressing Goal: Will verbalize the importance of balancing activity with adequate rest periods Outcome: Progressing   Problem: Respiratory: Goal: Ability to maintain a clear airway will improve Outcome: Progressing Goal: Levels of oxygenation will improve Outcome: Progressing Goal: Ability to maintain adequate ventilation will improve Outcome: Progressing    Problem: Education: Goal: Knowledge of General Education information will improve Description: Including pain rating scale, medication(s)/side effects and non-pharmacologic comfort measures Outcome: Progressing   Problem: Health Behavior/Discharge Planning: Goal: Ability to manage health-related needs will improve Outcome: Progressing   Problem: Clinical Measurements: Goal: Ability to maintain clinical measurements within normal limits will improve Outcome: Progressing Goal: Will remain free from infection Outcome: Progressing Goal: Diagnostic test results will improve Outcome: Progressing Goal: Respiratory complications will improve Outcome: Progressing Goal: Cardiovascular complication will be avoided Outcome: Progressing   Problem: Activity: Goal: Risk for activity intolerance will decrease Outcome: Progressing   Problem: Nutrition: Goal: Adequate nutrition will be maintained Outcome: Progressing   Problem: Coping: Goal: Level of anxiety will decrease Outcome: Progressing   Problem: Elimination: Goal: Will not experience complications related to bowel motility Outcome: Progressing Goal: Will not experience complications related to urinary retention Outcome: Progressing   Problem: Pain Managment: Goal: General experience of comfort will improve and/or be controlled Outcome: Progressing   Problem: Safety: Goal: Ability to remain free from injury will improve Outcome: Progressing   Problem: Skin Integrity: Goal: Risk for impaired skin integrity will decrease Outcome: Progressing

## 2023-03-24 NOTE — Progress Notes (Signed)
   03/24/23 2358  BiPAP/CPAP/SIPAP  Reason BIPAP/CPAP not in use Non-compliant  BiPAP/CPAP /SiPAP Vitals  Resp 18  MEWS Score/Color  MEWS Score 0  MEWS Score Color [place] Si

## 2023-03-24 NOTE — Progress Notes (Signed)
*  PRELIMINARY RESULTS* Echocardiogram 2D Echocardiogram has been performed.  Mario [name] 03/24/2023, 2:14 PM

## 2023-03-24 NOTE — Progress Notes (Signed)
Mobility Specialist - Progress Note  ([place] 6L) Pre-mobility: 86 bpm HR, 97% SpO2 During mobility: 108 bpm HR, 90% SpO2 Post-mobility: 98 bpm HR, 98% SPO2   03/24/23 0917  Mobility  Activity Ambulated independently in hallway  Level of Assistance Independent after set-up  Assistive Device None  Distance Ambulated (ft) 100 ft  Range of Motion/Exercises Active  Activity Response Tolerated fair  Mobility Referral Yes  Mobility visit 1 Mobility  Mobility Specialist Start Time (ACUTE ONLY) 0905  Mobility Specialist Stop Time (ACUTE ONLY) 0915  Mobility Specialist Time Calculation (min) (ACUTE ONLY) 10 min   Pt was found in bed and agreeable to ambulate. Had x1 brief standing rest break due to feeling a little lightheaded. SPO2 checked to be 90%. At EOS returned to sit EOB with all needs met. Call bell in reach.  Billey Chang Mobility Specialist

## 2023-03-24 NOTE — Progress Notes (Signed)
PROGRESS NOTE    Mario [name]  ZOX:[id number] DOB: Mar 24, 1950 DOA: 03/17/2023 PCP: [name]  Chief Complaint  Patient presents with   Shortness of Breath    Brief Narrative:   Mr. Mario [name] is Mario [name] 73 yo male with PMH COPD, OSA, lung cancer (with radiation tx), chronic hypoxia on home O2 4L, depression/anxiety, hx PE on Eliquis, morbid obesity, HTN, OSA on CPAP who presented with worsening shortness of breath and cough.  Found to have RSV infection.  Treating for RSV with presumed superimposed bacterial pneumonia as well as COPD exacerbation with steroids.   Assessment & Plan:   Principal Problem:   Community acquired pneumonia Active Problems:   COPD with acute exacerbation (HCC)   Acute on chronic hypoxic respiratory failure (HCC)   Acute hypoxic respiratory failure (HCC)   RSV (respiratory syncytial virus pneumonia)   Hyperkalemia   Essential hypertension   History of lung cancer   History of pulmonary embolus (PE)   RSV bronchitis  Chest Pain He notes chronic CP for years.  This episode was brief, about 3 min in duration, occurred  at rest.  No clear exacerbating/alleviating factors.  It's resolved by the time I saw him at bedside.  He notes chronic CP, lasting up to 30 min that would be worth outpatient follow up pending this inpatient workup. Negative troponin, will follow echo.  EKG with IVCD, T wave inversions in III, aVF.  Acute on chronic hypoxic respiratory failure CAP RSV infection -Presented with shortness of breath, cough, some sputum and worsening SpO2 despite home O2 -Did endorse sick contacts at home with similar symptoms -positive for RSV -CT chest with chronic airspace disease at bases, progressed area in RLL since 11/03/2022 (superimposed infectious pneumonia or progression of chronic disease)  - negative urine strep, urine legionella - MRSA positive - s/p doxy, ceftriaxone.  CXR 1/23 with stable bibasilar consolidation.  - wean O2 as able      COPD exacerbation Chronic hypoxic respiratory failure 4 to 5 L oxygen at baseline -Severe exacerbation in setting of pneumonia and RSV infection. - steroid taper - repeat CXR 1/23 with stable bibasilar consolidation  - continue O2    Hyperkalemia  resolved    History of lung cancer-status post radiation chemotherapy Patient reported history [facility] lung [facility].  Unable to retrieve any records on the chart.  Patient reported that he has been following oncology every 3 months and most recent CT scan in November 2024 showed no evidence of recurrent lung cancer.   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   Generalized anxiety disorder -Continue BuSpar 15 mg daily.  Continue Cymbalta 60 mg twice daily - resume trazodone for sleep at night    DMII - Holding metformin in acute setting - A1c 6.7% - Continue SSI + basal and CBG monitoring   Insomnia - Continue melatonin and trazodone    BPH - Continue Flomax    DVT prophylaxis: eliquis Code Status: full Family Communication: none Disposition:   Status is: Inpatient Remains inpatient appropriate because: need for inpatient care   Consultants:  none  Procedures:  none  Antimicrobials:  Anti-infectives (From admission, onward)    Start     Dose/Rate Route Frequency Ordered Stop   03/20/23 2200  doxycycline (VIBRA-TABS) tablet 100 mg        100 mg Oral Every 12 hours 03/20/23  1133 03/23/23 2105   03/17/23 0500  doxycycline (VIBRAMYCIN) 100 mg in sodium chloride 0.9 % 250 mL IVPB        100 mg 125 mL/hr over 120 Minutes Intravenous Every 12 hours 03/17/23 0420 03/20/23 0737   03/17/23 0430  cefTRIAXone (ROCEPHIN) 2 g in sodium chloride 0.9 % 100 mL IVPB        2 g 200 mL/hr over 30 Minutes Intravenous Every 24 hours 03/17/23 0420 03/24/23 0721       Subjective: Continued  SOB  Objective: Vitals:   03/23/23 2120 03/23/23 2200 03/24/23 0538 03/24/23 0808  BP:   (!) 146/71   Pulse:   83   Resp: (!) 25 (!) 21 18   Temp:   97.9 F (36.6 C)   TempSrc:      SpO2:   97% 94%  Weight:      Height:        Intake/Output Summary (Last 24 hours) at 03/24/2023 1141 Last data filed at 03/24/2023 1100 Gross per 24 hour  Intake 1403 ml  Output 3200 ml  Net -1797 ml   Filed Weights   03/17/23 0108 03/17/23 1147  Weight: (!) 139.7 kg (!) 141.3 kg    Examination:  General: modestly more comfortable appearing today Cardiovascular: RRR Lungs: diffuse wheezing Neurological: Alert and oriented 3. Moves all extremities 4 with equal strength. Cranial nerves II through XII grossly intact. Extremities: No clubbing or cyanosis. No edema.   Data Reviewed: I have personally reviewed following labs and imaging studies  CBC: Recent Labs  Lab 03/18/23 0323 03/19/23 0256 03/21/23 0253 03/23/23 0455 03/24/23 0455  WBC 16.7* 13.4* 15.4* 13.9* 13.1*  NEUTROABS 14.1* 11.1* 12.5* 7.9* 9.1*  HGB 14.5 14.5 14.8 14.8 13.8  HCT 44.5 44.6 44.8 45.0 41.6  MCV 94.1 94.7 92.2 92.8 92.7  PLT 393 305 317 292 297    Basic Metabolic Panel: Recent Labs  Lab 03/18/23 0323 03/19/23 0256 03/21/23 0253 03/23/23 0455 03/24/23 0455  NA 134* 138 134* 134* 133*  K 4.5 4.7 4.3 3.8 4.0  CL 99 100 101 101 99  CO2 25 26 24 25 25   GLUCOSE 179* 169* 219* 156* 206*  BUN 21 24* 26* 28* 23  CREATININE 0.89 0.78 0.55* 0.84 0.85  CALCIUM 9.0 9.1 8.7* 8.5* 8.1*  MG 2.1 2.1 2.1 2.0 2.1  PHOS  --   --   --  3.6 3.3    GFR: Estimated Creatinine Clearance: 119.3 mL/min (by C-G formula based on SCr of 0.85 mg/dL).  Liver Function Tests: Recent Labs  Lab 03/18/23 0323 03/19/23 0256 03/23/23 0455 03/24/23 0455  AST 21 24 17 15   ALT 20 18 33 27  ALKPHOS 80 70 64 59  BILITOT 0.2 0.6 0.4 0.3  PROT 7.4 7.1 6.5 6.2*  ALBUMIN 3.7 3.6 3.2* 2.9*    CBG: Recent Labs  Lab  03/23/23 1138 03/23/23 1621 03/23/23 2054 03/24/23 0741 03/24/23 1119  GLUCAP 131* 124* 268* 146* 134*     Recent Results (from the past 240 hours)  Resp panel by RT-PCR (RSV, Flu Mario [name]&B, Covid) Anterior Nasal Swab     Status: Abnormal   Collection Time: 03/17/23  1:03 AM   Specimen: Anterior Nasal Swab  Result Value Ref Range Status   SARS Coronavirus 2 by RT PCR NEGATIVE NEGATIVE Final    Comment: (NOTE) SARS-CoV-2 target nucleic acids are NOT DETECTED.  The SARS-CoV-2 RNA is generally detectable in upper respiratory specimens during the acute  phase of infection. The lowest concentration of SARS-CoV-2 viral copies this assay can detect is 138 copies/mL. Shaela Boer negative result does not preclude SARS-Cov-2 infection and should not be used as the sole basis for treatment or other patient management decisions. Herman Mell negative result may occur with  improper specimen collection/handling, submission of specimen other than nasopharyngeal swab, presence of viral mutation(s) within the areas targeted by this assay, and inadequate number of viral copies(<138 copies/mL). Achillies Buehl negative result must be combined with clinical observations, patient history, and epidemiological information. The expected result is Negative.  Fact Sheet for Patients:  BloggerCourse.com  Fact Sheet for Healthcare Providers:  SeriousBroker.it  This test is no t yet approved or cleared by the [place] FDA and  has been authorized for detection and/or diagnosis of SARS-CoV-2 by FDA under an Emergency Use Authorization (EUA). This EUA will remain  in effect (meaning this test can be used) for the duration of the COVID-19 declaration under Section 564(b)(1) of the Act, 21 U.S.C.section 360bbb-3(b)(1), unless the authorization is terminated  or revoked sooner.       Influenza Tashya Alberty by PCR NEGATIVE NEGATIVE Final   Influenza B by PCR NEGATIVE NEGATIVE Final    Comment:  (NOTE) The Xpert Xpress SARS-CoV-2/FLU/RSV plus assay is intended as an aid in the diagnosis of influenza from Nasopharyngeal swab specimens and should not be used as Dalessandro Baldyga sole basis for treatment. Nasal washings and aspirates are unacceptable for Xpert Xpress SARS-CoV-2/FLU/RSV testing.  Fact Sheet for Patients: BloggerCourse.com  Fact Sheet for Healthcare Providers: SeriousBroker.it  This test is not yet approved or cleared by the [place] FDA and has been authorized for detection and/or diagnosis of SARS-CoV-2 by FDA under an Emergency Use Authorization (EUA). This EUA will remain in effect (meaning this test can be used) for the duration of the COVID-19 declaration under Section 564(b)(1) of the Act, 21 U.S.C. section 360bbb-3(b)(1), unless the authorization is terminated or revoked.     Resp Syncytial Virus by PCR POSITIVE (Yaquelin Langelier) NEGATIVE Final    Comment: (NOTE) Fact Sheet for Patients: BloggerCourse.com  Fact Sheet for Healthcare Providers: SeriousBroker.it  This test is not yet approved or cleared by the [place] FDA and has been authorized for detection and/or diagnosis of SARS-CoV-2 by FDA under an Emergency Use Authorization (EUA). This EUA will remain in effect (meaning this test can be used) for the duration of the COVID-19 declaration under Section 564(b)(1) of the Act, 21 U.S.C. section 360bbb-3(b)(1), unless the authorization is terminated or revoked.  Performed at [facility], [address] [place], [place] [address]   Culture, blood (Routine X 2) w Reflex to ID Panel     Status: None   Collection Time: 03/17/23  5:15 AM   Specimen: BLOOD  Result Value Ref Range Status   Specimen Description   Final    BLOOD LEFT ANTECUBITAL Performed at [facility], [address], [place], [place] [address]    Special  Requests   Final    BOTTLES DRAWN AEROBIC AND ANAEROBIC Blood Culture results may not be optimal due to an inadequate volume of blood received in culture bottles Performed at [facility], [address] [place] [address], [place], [place] [address]    Culture   Final    NO GROWTH 5 DAYS Performed at [facility], [address] [place], [place] [address]    Report Status 03/22/2023 FINAL  Final  Culture, blood (Routine X 2) w Reflex to  ID Panel     Status: None   Collection Time: 03/17/23  5:30 AM   Specimen: BLOOD RIGHT HAND  Result Value Ref Range Status   Specimen Description   Final    BLOOD RIGHT HAND Performed at [facility], [address] [place], [place] [address]    Special Requests   Final    BOTTLES DRAWN AEROBIC AND ANAEROBIC Blood Culture results may not be optimal due to an inadequate volume of blood received in culture bottles Performed at [facility], [address], [place], [place] [address]    Culture   Final    NO GROWTH 5 DAYS Performed at [facility], [address] [place], [place] [address]    Report Status 03/22/2023 FINAL  Final  MRSA Next Gen by PCR, Nasal     Status: Abnormal   Collection Time: 03/17/23 12:14 PM   Specimen: Nasal Mucosa; Nasal Swab  Result Value Ref Range Status   MRSA by PCR Next Gen DETECTED (Mallissa Lorenzen) NOT DETECTED Final    Comment: RESULT CALLED TO, READ BACK BY AND VERIFIED WITH: CLAPP, B RN @ 1426 03/17/23. GILBERTL (NOTE) The GeneXpert MRSA Assay (FDA approved for NASAL specimens only), is one component of Kierstan Auer comprehensive MRSA colonization surveillance program. It is not intended to diagnose MRSA infection nor to guide or monitor treatment for MRSA infections. Test performance is not FDA approved in patients less than 12 years old. Performed at [facility], [address], [place], [place] [address]          Radiology Studies: DG Chest 2  View Result Date: 03/23/2023 CLINICAL DATA:  Short of breath, history of lung cancer EXAM: CHEST - 2 VIEW COMPARISON:  03/17/2023 FINDINGS: Frontal and lateral views of the chest demonstrate an unremarkable cardiac silhouette. The bibasilar consolidation seen on prior study is unchanged. Severe background emphysema is again noted, with areas of subpleural scarring and fibrosis greatest within the left lung base. No effusion or pneumothorax. No acute bony abnormalities. IMPRESSION: 1. Stable bibasilar consolidation, which may reflect pneumonia or progression of known lung cancer. 2. Stable emphysema and scarring. Electronically Signed   By: Sharlet Salina M.D.   On: 03/23/2023 16:56        Scheduled Meds:  apixaban  5 mg Oral BID   arformoterol  15 mcg Nebulization BID   budesonide (PULMICORT) nebulizer solution  0.25 mg Nebulization BID   buPROPion  300 mg Oral q morning   busPIRone  15 mg Oral Daily   cholecalciferol  1,000 Units Oral Daily   cyclobenzaprine  10 mg Oral QHS   diltiazem  180 mg Oral Daily   DULoxetine  60 mg Oral BID   folic acid  1 mg Oral Daily   gabapentin  100 mg Oral TID   guaiFENesin  600 mg Oral BID   insulin aspart  0-20 Units Subcutaneous TID WC   insulin aspart  0-5 Units Subcutaneous QHS   insulin glargine-yfgn  5 Units Subcutaneous Daily   ipratropium-albuterol  3 mL Nebulization Q6H WA   melatonin  6 mg Oral QHS   OXcarbazepine  600 mg Oral TID   predniSONE  40 mg Oral Q breakfast   Followed by   [name] ON 03/26/2023] predniSONE  30 mg Oral Q breakfast   Followed by   [name] ON 03/29/2023] predniSONE  20 mg Oral Q breakfast   Followed by   [name] ON 04/01/2023] predniSONE  10 mg Oral Q breakfast   sodium chloride flush  3 mL Intravenous Q12H   sodium chloride flush  3 mL Intravenous Q12H   tamsulosin  0.8 mg Oral Daily   traZODone  75 mg Oral QHS   Continuous Infusions:     LOS: 7 days    Time spent: 35 min    Lacretia Nicks, MD [facility]   To contact the attending provider between 7A-7P or the covering provider during after hours 7P-7A, please log into the web site [email or web address] and access using universal [place] password for that web site. If you do not have the password, please call the hospital operator.  03/24/2023, 11:41 AM

## 2023-03-25 DIAGNOSIS — J9601 Acute respiratory failure with hypoxia: Secondary | ICD-10-CM | POA: Diagnosis not present

## 2023-03-25 LAB — COMPREHENSIVE METABOLIC PANEL
ALT: 7 U/L (ref 0–44)
AST: 13 U/L — ABNORMAL LOW (ref 15–41)
Albumin: 3.3 g/dL — ABNORMAL LOW (ref 3.5–5.0)
Alkaline Phosphatase: 60 U/L (ref 38–126)
Anion gap: 7 (ref 5–15)
BUN: 21 mg/dL (ref 8–23)
CO2: 31 mmol/L (ref 22–32)
Calcium: 8.4 mg/dL — ABNORMAL LOW (ref 8.9–10.3)
Chloride: 98 mmol/L (ref 98–111)
Creatinine, Ser: 0.94 mg/dL (ref 0.61–1.24)
GFR, Estimated: >60 mL/min (ref >60)
Glucose, Bld: 162 mg/dL — ABNORMAL HIGH (ref 70–99)
Potassium: 4.3 mmol/L (ref 3.5–5.1)
Sodium: 136 mmol/L (ref 135–145)
Total Bilirubin: 0.5 mg/dL (ref 0.0–1.2)
Total Protein: 6.4 g/dL — ABNORMAL LOW (ref 6.5–8.1)

## 2023-03-25 LAB — CBC WITH DIFFERENTIAL/PLATELET
Abs Immature Granulocytes: 0.17 10*3/uL — ABNORMAL HIGH (ref 0.00–0.07)
Basophils Absolute: 0.1 10*3/uL (ref 0.0–0.1)
Basophils Relative: 0 %
Eosinophils Absolute: 0.4 10*3/uL (ref 0.0–0.5)
Eosinophils Relative: 4 %
HCT: 41 % (ref 39.0–52.0)
Hemoglobin: 13.4 g/dL (ref 13.0–17.0)
Immature Granulocytes: 1 %
Lymphocytes Relative: 25 %
Lymphs Abs: 3.1 10*3/uL (ref 0.7–4.0)
MCH: 30.8 pg (ref 26.0–34.0)
MCHC: 32.7 g/dL (ref 30.0–36.0)
MCV: 94.3 fL (ref 80.0–100.0)
Monocytes Absolute: 1.2 10*3/uL — ABNORMAL HIGH (ref 0.1–1.0)
Monocytes Relative: 10 %
Neutro Abs: 7.5 10*3/uL (ref 1.7–7.7)
Neutrophils Relative %: 60 %
Platelets: 283 10*3/uL (ref 150–400)
RBC: 4.35 MIL/uL (ref 4.22–5.81)
RDW: 13.3 % (ref 11.5–15.5)
WBC: 12.4 10*3/uL — ABNORMAL HIGH (ref 4.0–10.5)
nRBC: 0 % (ref 0.0–0.2)

## 2023-03-25 LAB — GLUCOSE, CAPILLARY
Glucose-Capillary: 161 mg/dL — ABNORMAL HIGH (ref 70–99)
Glucose-Capillary: 133 mg/dL — ABNORMAL HIGH (ref 70–99)
Glucose-Capillary: 223 mg/dL — ABNORMAL HIGH (ref 70–99)
Glucose-Capillary: 158 mg/dL — ABNORMAL HIGH (ref 70–99)

## 2023-03-25 LAB — MAGNESIUM: Magnesium: 2.2 mg/dL (ref 1.7–2.4)

## 2023-03-25 LAB — PHOSPHORUS: Phosphorus: 3.3 mg/dL (ref 2.5–4.6)

## 2023-03-25 MED ORDER — POLYETHYLENE GLYCOL 3350 17 G PO PACK
17.0000 g | PACK | Freq: Three times a day (TID) | ORAL | Status: DC
  Administered 2023-03-25 – 2023-04-01 (×21): 17 g via ORAL
  Filled 2023-03-25 (×22): qty 1

## 2023-03-25 MED ORDER — LACTULOSE 10 GM/15ML PO SOLN
20.0000 g | Freq: Two times a day (BID) | ORAL | Status: DC
  Administered 2023-03-25 – 2023-04-01 (×15): 20 g via ORAL
  Filled 2023-03-25 (×15): qty 30

## 2023-03-25 MED ORDER — SENNOSIDES-DOCUSATE SODIUM 8.6-50 MG PO TABS
2.0000 | ORAL_TABLET | Freq: Every day | ORAL | Status: DC
  Administered 2023-03-25 – 2023-03-31 (×7): 2 via ORAL
  Filled 2023-03-25 (×7): qty 2

## 2023-03-25 NOTE — Progress Notes (Signed)
PROGRESS NOTE    Mario [name]  VWU:[id number] DOB: April 11, 1950 DOA: 03/17/2023 PCP: [name]  Chief Complaint  Patient presents with   Shortness of Breath    Brief Narrative:   Mario [name] is Mario [name] 73 yo male with PMH COPD, OSA, lung cancer (with radiation tx), chronic hypoxia on home O2 4L, depression/anxiety, hx PE on Eliquis, morbid obesity, HTN, OSA on CPAP who presented with worsening shortness of breath and cough.  Found to have RSV infection.  Treating for RSV with presumed superimposed bacterial pneumonia as well as COPD exacerbation with steroids.   Assessment & Plan:   Principal Problem:   Community acquired pneumonia Active Problems:   COPD with acute exacerbation (HCC)   Acute on chronic hypoxic respiratory failure (HCC)   Acute hypoxic respiratory failure (HCC)   RSV (respiratory syncytial virus pneumonia)   Hyperkalemia   Essential hypertension   History of lung cancer   History of pulmonary embolus (PE)   RSV bronchitis  Acute on chronic hypoxic respiratory failure CAP RSV infection -Presented with shortness of breath, cough, some sputum and worsening SpO2 despite home O2 -Did endorse sick contacts at home with similar symptoms -positive for RSV -CT chest with chronic airspace disease at bases, progressed area in RLL since 11/03/2022 (superimposed infectious pneumonia or progression of chronic disease)  - negative urine strep, urine legionella - MRSA positive - sputum cx from 1/24 pending, consider extended course of abx based on these results - s/p doxy, ceftriaxone.  CXR 1/23 with stable bibasilar consolidation.  - wean O2 as able     COPD exacerbation Chronic hypoxic respiratory failure 4 to 5 L oxygen at baseline -Severe exacerbation in setting of pneumonia and RSV infection. - steroid taper - repeat CXR 1/23 with stable bibasilar consolidation  - continue O2    Chest Pain He notes chronic CP for years.  This episode was brief, about 3  min in duration, occurred  at rest.  No clear exacerbating/alleviating factors.  It's resolved by the time I saw him at bedside.  He notes chronic CP, lasting up to 30 min that would be worth outpatient follow up pending this inpatient workup. Negative troponin EKG with IVCD, T wave inversions in III, aVF Echo with EF 60-65%, no RWMA, grade 1 diastolic dysfunction  Hyperkalemia  resolved    History of lung cancer-status post radiation chemotherapy Patient reported [facility]  Unable to retrieve any records on the chart.  Patient reported that he has been following oncology every 3 months and most recent CT scan in November 2024 showed no evidence of recurrent lung cancer.   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   Generalized anxiety disorder -Continue BuSpar 15 mg daily.  Continue Cymbalta 60 mg twice daily - resume trazodone for sleep at night    DMII - Holding metformin in acute setting - A1c 6.7% - Continue SSI + basal and CBG monitoring   Insomnia - Continue melatonin and trazodone    BPH - Continue Flomax  Constipation -- miralax, lactulose, senna -- he wants to hold off for now on enema or suppository    DVT prophylaxis: eliquis Code Status: full Family Communication: none Disposition:   Status is: Inpatient Remains inpatient appropriate because: need for inpatient care   Consultants:  none  Procedures:  Echo IMPRESSIONS  1. Left ventricular ejection fraction, by estimation, is 60 to 65%. The  left ventricle has normal function. The left ventricle has no regional  wall motion abnormalities. There is mild concentric left ventricular  hypertrophy. Left ventricular diastolic  parameters are consistent with Grade I diastolic dysfunction (impaired  relaxation).   2. Right ventricular systolic function is  normal. The right ventricular  size is normal. There is normal pulmonary artery systolic pressure.   3. The mitral valve is normal in structure. Trivial mitral valve  regurgitation. No evidence of mitral stenosis.   4. The aortic valve is normal in structure. Aortic valve regurgitation is  not visualized. No aortic stenosis is present.   5. The inferior vena cava is dilated in size with >50% respiratory  variability, suggesting right atrial pressure of 8 mmHg.   Antimicrobials:  Anti-infectives (From admission, onward)    Start     Dose/Rate Route Frequency Ordered Stop   03/20/23 2200  doxycycline (VIBRA-TABS) tablet 100 mg        100 mg Oral Every 12 hours 03/20/23 1133 03/23/23 2105   03/17/23 0500  doxycycline (VIBRAMYCIN) 100 mg in sodium chloride 0.9 % 250 mL IVPB        100 mg 125 mL/hr over 120 Minutes Intravenous Every 12 hours 03/17/23 0420 03/20/23 0737   03/17/23 0430  cefTRIAXone (ROCEPHIN) 2 g in sodium chloride 0.9 % 100 mL IVPB        2 g 200 mL/hr over 30 Minutes Intravenous Every 24 hours 03/17/23 0420 03/24/23 0721       Subjective: Asking for help with constipation Doesn't feel ready to d/c from breathing standpoint  Objective: Vitals:   03/24/23 2358 03/25/23 0354 03/25/23 0449 03/25/23 0754  BP:   119/75   Pulse:   71   Resp: 18  16   Temp:   97.7 F (36.5 C)   TempSrc:      SpO2:  94% 100% 95%  Weight:      Height:        Intake/Output Summary (Last 24 hours) at 03/25/2023 0833 Last data filed at 03/25/2023 0700 Gross per 24 hour  Intake 1320 ml  Output 1750 ml  Net -430 ml   Filed Weights   03/17/23 0108 03/17/23 1147  Weight: (!) 139.7 kg (!) 141.3 kg    Examination:  General: No acute distress. Cardiovascular: distant Lungs: diffuse rhonchi, less notable wheezing today Neurological: Alert and oriented 3. Moves all extremities 4 with equal strength. Cranial nerves II through XII grossly intact. Extremities: No clubbing or cyanosis.  No edema.   Data Reviewed: I have personally reviewed following labs and imaging studies  CBC: Recent Labs  Lab 03/19/23 0256 03/21/23 0253 03/23/23 0455 03/24/23 0455 03/25/23 0551  WBC 13.4* 15.4* 13.9* 13.1* 12.4*  NEUTROABS 11.1* 12.5* 7.9* 9.1* 7.5  HGB 14.5 14.8 14.8 13.8 13.4  HCT 44.6 44.8 45.0 41.6 41.0  MCV 94.7 92.2 92.8 92.7 94.3  PLT 305 317 292 297 283    Basic Metabolic Panel: Recent Labs  Lab 03/19/23 0256 03/21/23 0253 03/23/23 0455 03/24/23 0455 03/25/23 0551  NA 138 134* 134* 133* 136  K 4.7 4.3 3.8 4.0 4.3  CL 100 101 101 99 98  CO2 26 24 25 25 31   GLUCOSE 169* 219* 156* 206* 162*  BUN 24* 26* 28* 23 21  CREATININE 0.78 0.55* 0.84 0.85 0.94  CALCIUM 9.1 8.7* 8.5* 8.1* 8.4*  MG 2.1 2.1 2.0 2.1 2.2  PHOS  --   --  3.6 3.3 3.3    GFR: Estimated Creatinine Clearance: 107.9 mL/min (by C-G formula based on SCr of 0.94 mg/dL).  Liver Function Tests: Recent Labs  Lab 03/19/23 0256 03/23/23 0455 03/24/23 0455 03/25/23 0551  AST 24 17 15  13*  ALT 18 33 27 7  ALKPHOS 70 64 59 60  BILITOT 0.6 0.4 0.3 0.5  PROT 7.1 6.5 6.2* 6.4*  ALBUMIN 3.6 3.2* 2.9* 3.3*    CBG: Recent Labs  Lab 03/24/23 0741 03/24/23 1119 03/24/23 1700 03/24/23 2056 03/25/23 0734  GLUCAP 146* 134* 208* 174* 161*     Recent Results (from the past 240 hours)  Resp panel by RT-PCR (RSV, Flu Dorothey Oetken&B, Covid) Anterior Nasal Swab     Status: Abnormal   Collection Time: 03/17/23  1:03 AM   Specimen: Anterior Nasal Swab  Result Value Ref Range Status   SARS Coronavirus 2 by RT PCR NEGATIVE NEGATIVE Final    Comment: (NOTE) SARS-CoV-2 target nucleic acids are NOT DETECTED.  The SARS-CoV-2 RNA is generally detectable in upper respiratory specimens during the acute phase of infection. The lowest concentration of SARS-CoV-2 viral copies this assay can detect is 138 copies/mL. Ramses Klecka negative result does not preclude SARS-Cov-2 infection and should not be used as the sole basis  for treatment or other patient management decisions. Rosezetta Balderston negative result may occur with  improper specimen collection/handling, submission of specimen other than nasopharyngeal swab, presence of viral mutation(s) within the areas targeted by this assay, and inadequate number of viral copies(<138 copies/mL). Evrett Hakim negative result must be combined with clinical observations, patient history, and epidemiological information. The expected result is Negative.  Fact Sheet for Patients:  BloggerCourse.com  Fact Sheet for Healthcare Providers:  SeriousBroker.it  This test is no t yet approved or cleared by the [place] FDA and  has been authorized for detection and/or diagnosis of SARS-CoV-2 by FDA under an Emergency Use Authorization (EUA). This EUA will remain  in effect (meaning this test can be used) for the duration of the COVID-19 declaration under Section 564(b)(1) of the Act, 21 U.S.C.section 360bbb-3(b)(1), unless the authorization is terminated  or revoked sooner.       Influenza Latina Frank by PCR NEGATIVE NEGATIVE Final   Influenza B by PCR NEGATIVE NEGATIVE Final    Comment: (NOTE) The Xpert Xpress SARS-CoV-2/FLU/RSV plus assay is intended as an aid in the diagnosis of influenza from Nasopharyngeal swab specimens and should not be used as Dani Danis sole basis for treatment. Nasal washings and aspirates are unacceptable for Xpert Xpress SARS-CoV-2/FLU/RSV testing.  Fact Sheet for Patients: BloggerCourse.com  Fact Sheet for Healthcare Providers: SeriousBroker.it  This test is not yet approved or cleared by the [place] FDA and has been authorized for detection and/or diagnosis of SARS-CoV-2 by FDA under an Emergency Use Authorization (EUA). This EUA will remain in effect (meaning this test can be used) for the duration of the COVID-19 declaration under Section 564(b)(1) of the Act, 21  U.S.C. section 360bbb-3(b)(1), unless the authorization is terminated or revoked.     Resp Syncytial Virus by PCR POSITIVE (Kambri Dismore) NEGATIVE Final    Comment: (NOTE) Fact Sheet for Patients: BloggerCourse.com  Fact Sheet for Healthcare Providers: SeriousBroker.it  This test is not yet approved or cleared by the [place] FDA and has been authorized for detection and/or diagnosis of SARS-CoV-2 by FDA under an Emergency Use Authorization (EUA). This EUA will remain in effect (meaning this test can be used) for  the duration of the COVID-19 declaration under Section 564(b)(1) of the Act, 21 U.S.C. section 360bbb-3(b)(1), unless the authorization is terminated or revoked.  Performed at [facility], [address], [place], [place] [address]   Culture, blood (Routine X 2) w Reflex to ID Panel     Status: None   Collection Time: 03/17/23  5:15 AM   Specimen: BLOOD  Result Value Ref Range Status   Specimen Description   Final    BLOOD LEFT ANTECUBITAL Performed at [facility], [address] [place], [place] [address]    Special Requests   Final    BOTTLES DRAWN AEROBIC AND ANAEROBIC Blood Culture results may not be optimal due to an inadequate volume of blood received in culture bottles Performed at [facility], [address], [place], [place] [address]    Culture   Final    NO GROWTH 5 DAYS Performed at [facility], [address] [place], [place] [address]    Report Status 03/22/2023 FINAL  Final  Culture, blood (Routine X 2) w Reflex to ID Panel     Status: None   Collection Time: 03/17/23  5:30 AM   Specimen: BLOOD RIGHT HAND  Result Value Ref Range Status   Specimen Description   Final    BLOOD RIGHT HAND Performed at [facility], [address], [place], [place] [address]    Special Requests   Final    BOTTLES DRAWN AEROBIC AND  ANAEROBIC Blood Culture results may not be optimal due to an inadequate volume of blood received in culture bottles Performed at [facility], [address], [place], [place] [address]    Culture   Final    NO GROWTH 5 DAYS Performed at [facility], [address], [place], [place] [address]    Report Status 03/22/2023 FINAL  Final  MRSA Next Gen by PCR, Nasal     Status: Abnormal   Collection Time: 03/17/23 12:14 PM   Specimen: Nasal Mucosa; Nasal Swab  Result Value Ref Range Status   MRSA by PCR Next Gen DETECTED (Lucie Friedlander) NOT DETECTED Final    Comment: RESULT CALLED TO, READ BACK BY AND VERIFIED WITH: CLAPP, B RN @ 1426 03/17/23. GILBERTL (NOTE) The GeneXpert MRSA Assay (FDA approved for NASAL specimens only), is one component of Dale Ribeiro comprehensive MRSA colonization surveillance program. It is not intended to diagnose MRSA infection nor to guide or monitor treatment for MRSA infections. Test performance is not FDA approved in patients less than 61 years old. Performed at [facility], [address], [place], [place] [address]   Expectorated Sputum Assessment w Gram Stain, Rflx to Resp Cult     Status: None   Collection Time: 03/24/23  4:56 PM   Specimen: Expectorated Sputum  Result Value Ref Range Status   Specimen Description EXPECTORATED SPUTUM  Final   Special Requests NONE  Final   Sputum evaluation   Final    THIS SPECIMEN IS ACCEPTABLE FOR SPUTUM CULTURE Performed at [facility], [address] [place], [place] [address]    Report Status 03/24/2023 FINAL  Final  Culture, Respiratory w Gram Stain     Status: None (Preliminary result)   Collection Time: 03/24/23  4:56 PM  Result Value Ref Range Status   Specimen Description   Final    EXPECTORATED SPUTUM Performed at [facility], [address] [place],  [place] [address]    Special Requests   Final    NONE Reflexed from [id number] Performed  at [facility], [address], [place], [place] [address]    Gram Stain   Final    FEW WBC PRESENT, PREDOMINANTLY PMN RARE GRAM POSITIVE COCCI RARE BUDDING YEAST SEEN Performed at [facility], [address] [place], [place] [address]    Culture PENDING  Incomplete   Report Status PENDING  Incomplete         Radiology Studies: ECHOCARDIOGRAM COMPLETE Result Date: 03/24/2023    ECHOCARDIOGRAM REPORT   Patient Name:   Mario [name] [facility] Date of Exam: 03/24/2023 Medical Rec #:  [id number]        Height:       74.0 in Accession #:    [id number]       Weight:       311.5 lb Date of Birth:  1950/09/13        BSA:          2.624 m Patient Age:    72 years         BP:           122/67 mmHg Patient Gender: M                HR:           97 bpm. Exam Location:  Inpatient Procedure: 2D Echo, Cardiac Doppler and Color Doppler Indications:    Chest pain  History:        Patient has prior history of Echocardiogram examinations, most                 recent 06/29/2021. COPD and CKD, Signs/Symptoms:Chest Pain; Risk                 Factors:Hypertension, Former Smoker and Diabetes.  Sonographer:    Dondra Prader RVT RCS Referring Phys: [phone] Myangel Summons CALDWELL POWELL JR  Sonographer Comments: Technically challenging study due to limited acoustic windows, Technically difficult study due to poor echo windows and patient is obese. Image acquisition challenging due to COPD and Image acquisition challenging due to patient body habitus. Technically limited and challenging due to very limited and poor windows IMPRESSIONS  1. Left ventricular ejection fraction, by estimation, is 60 to 65%. The left ventricle has normal function. The left ventricle has no regional wall motion abnormalities. There is mild concentric left ventricular hypertrophy. Left ventricular diastolic parameters are consistent with Grade I diastolic dysfunction (impaired relaxation).  2. Right ventricular systolic function is normal. The  right ventricular size is normal. There is normal pulmonary artery systolic pressure.  3. The mitral valve is normal in structure. Trivial mitral valve regurgitation. No evidence of mitral stenosis.  4. The aortic valve is normal in structure. Aortic valve regurgitation is not visualized. No aortic stenosis is present.  5. The inferior vena cava is dilated in size with >50% respiratory variability, suggesting right atrial pressure of 8 mmHg. FINDINGS  Left Ventricle: Left ventricular ejection fraction, by estimation, is 60 to 65%. The left ventricle has normal function. The left ventricle has no regional wall motion abnormalities. The left ventricular internal cavity size was normal in size. There is  mild concentric left ventricular hypertrophy. Left ventricular diastolic parameters are consistent with Grade I diastolic dysfunction (impaired relaxation). Right Ventricle: The right ventricular size is normal. No increase in right ventricular wall thickness. Right ventricular systolic function is normal. There is normal pulmonary artery  systolic pressure. The tricuspid regurgitant velocity is 0.93 m/s, and  with an assumed right atrial pressure of 8 mmHg, the estimated right ventricular systolic pressure is 11.5 mmHg. Left Atrium: Left atrial size was normal in size. Right Atrium: Right atrial size was normal in size. Pericardium: There is no evidence of pericardial effusion. Mitral Valve: The mitral valve is normal in structure. Trivial mitral valve regurgitation. No evidence of mitral valve stenosis. Tricuspid Valve: The tricuspid valve is normal in structure. Tricuspid valve regurgitation is trivial. No evidence of tricuspid stenosis. Aortic Valve: The aortic valve is normal in structure. Aortic valve regurgitation is not visualized. No aortic stenosis is present. Aortic valve mean gradient measures 2.0 mmHg. Aortic valve peak gradient measures 3.9 mmHg. Aortic valve area, by VTI measures 3.00 cm. Pulmonic Valve:  The pulmonic valve was normal in structure. Pulmonic valve regurgitation is not visualized. No evidence of pulmonic stenosis. Aorta: The aortic root is normal in size and structure. Venous: The inferior vena cava is dilated in size with greater than 50% respiratory variability, suggesting right atrial pressure of 8 mmHg. IAS/Shunts: No atrial level shunt detected by color flow Doppler.  LEFT VENTRICLE PLAX 2D LVIDd:         5.40 cm LVIDs:         3.20 cm LV PW:         1.20 cm LV IVS:        1.10 cm LVOT diam:     2.20 cm LV SV:         47 LV SV Index:   18 LVOT Area:     3.80 cm  IVC IVC diam: 2.40 cm LEFT ATRIUM           Index LA diam:      3.80 cm 1.45 cm/m LA Vol (A4C): 32.8 ml 12.50 ml/m  AORTIC VALVE                    PULMONIC VALVE AV Area (Vmax):    3.22 cm     PV Vmax:       0.98 m/s AV Area (Vmean):   3.12 cm     PV Peak grad:  3.8 mmHg AV Area (VTI):     3.00 cm AV Vmax:           99.10 cm/s AV Vmean:          65.800 cm/s AV VTI:            0.157 m AV Peak Grad:      3.9 mmHg AV Mean Grad:      2.0 mmHg LVOT Vmax:         84.00 cm/s LVOT Vmean:        54.000 cm/s LVOT VTI:          0.124 m LVOT/AV VTI ratio: 0.79  AORTA Ao Root diam: 3.60 cm TRICUSPID VALVE TR Peak grad:   3.5 mmHg TR Vmax:        93.00 cm/s  SHUNTS Systemic VTI:  0.12 m Systemic Diam: 2.20 cm Chilton Si MD Electronically signed by Chilton Si MD Signature Date/Time: 03/24/2023/5:07:48 PM    Final    DG Chest 2 View Result Date: 03/23/2023 CLINICAL DATA:  Short of breath, history of lung cancer EXAM: CHEST - 2 VIEW COMPARISON:  03/17/2023 FINDINGS: Frontal and lateral views of the chest demonstrate an unremarkable cardiac silhouette. The bibasilar consolidation seen on prior study is unchanged. Severe background emphysema  is again noted, with areas of subpleural scarring and fibrosis greatest within the left lung base. No effusion or pneumothorax. No acute bony abnormalities. IMPRESSION: 1. Stable bibasilar  consolidation, which may reflect pneumonia or progression of known lung cancer. 2. Stable emphysema and scarring. Electronically Signed   By: Sharlet Salina M.D.   On: 03/23/2023 16:56        Scheduled Meds:  apixaban  5 mg Oral BID   arformoterol  15 mcg Nebulization BID   budesonide (PULMICORT) nebulizer solution  0.25 mg Nebulization BID   buPROPion  300 mg Oral q morning   busPIRone  15 mg Oral Daily   cholecalciferol  1,000 Units Oral Daily   cyclobenzaprine  10 mg Oral QHS   diltiazem  180 mg Oral Daily   DULoxetine  60 mg Oral BID   folic acid  1 mg Oral Daily   gabapentin  100 mg Oral TID   guaiFENesin  600 mg Oral BID   insulin aspart  0-20 Units Subcutaneous TID WC   insulin aspart  0-5 Units Subcutaneous QHS   insulin glargine-yfgn  5 Units Subcutaneous Daily   ipratropium-albuterol  3 mL Nebulization Q6H WA   lactulose  20 g Oral BID   melatonin  6 mg Oral QHS   OXcarbazepine  600 mg Oral TID   polyethylene glycol  17 g Oral BID   [START ON 03/26/2023] predniSONE  30 mg Oral Q breakfast   Followed by   [name] ON 03/29/2023] predniSONE  20 mg Oral Q breakfast   Followed by   [name] ON 04/01/2023] predniSONE  10 mg Oral Q breakfast   sodium chloride flush  3 mL Intravenous Q12H   sodium chloride flush  3 mL Intravenous Q12H   tamsulosin  0.8 mg Oral Daily   traZODone  75 mg Oral QHS   Continuous Infusions:     LOS: 8 days    Time spent: 35 min    Lacretia Nicks, MD [facility]   To contact the attending provider between 7A-7P or the covering provider during after hours 7P-7A, please log into the web site [email or web address] and access using universal [place] password for that web site. If you do not have the password, please call the hospital operator.  03/25/2023, 8:33 AM

## 2023-03-25 NOTE — Plan of Care (Signed)
  Problem: Coping: Goal: Ability to adjust to condition or change in health will improve Outcome: Progressing   Problem: Activity: Goal: Ability to tolerate increased activity will improve Outcome: Progressing   Problem: Elimination: Goal: Will not experience complications related to bowel motility Outcome: Not Progressing

## 2023-03-25 NOTE — Progress Notes (Signed)
   03/25/23 2157  BiPAP/CPAP/SIPAP  Reason BIPAP/CPAP not in use Non-compliant  BiPAP/CPAP /SiPAP Vitals  Resp (!) 27  MEWS Score/Color  MEWS Score 2  MEWS Score Color Yellow

## 2023-03-26 ENCOUNTER — Inpatient Hospital Stay (HOSPITAL_COMMUNITY): Payer: No Typology Code available for payment source

## 2023-03-26 DIAGNOSIS — J9601 Acute respiratory failure with hypoxia: Secondary | ICD-10-CM | POA: Diagnosis not present

## 2023-03-26 LAB — GLUCOSE, CAPILLARY
Glucose-Capillary: 118 mg/dL — ABNORMAL HIGH (ref 70–99)
Glucose-Capillary: 146 mg/dL — ABNORMAL HIGH (ref 70–99)
Glucose-Capillary: 202 mg/dL — ABNORMAL HIGH (ref 70–99)
Glucose-Capillary: 123 mg/dL — ABNORMAL HIGH (ref 70–99)

## 2023-03-26 MED ORDER — BISACODYL 10 MG RE SUPP
10.0000 mg | Freq: Once | RECTAL | Status: AC
  Administered 2023-03-26: 10 mg via RECTAL
  Filled 2023-03-26: qty 1

## 2023-03-26 MED ORDER — SODIUM CHLORIDE 0.9 % IV SOLN
3.0000 g | Freq: Four times a day (QID) | INTRAVENOUS | Status: DC
  Administered 2023-03-26 – 2023-03-30 (×17): 3 g via INTRAVENOUS
  Filled 2023-03-26 (×17): qty 8

## 2023-03-26 MED ORDER — FLEET ENEMA RE ENEM
1.0000 | ENEMA | Freq: Once | RECTAL | Status: AC | PRN
  Administered 2023-03-26: 1 via RECTAL
  Filled 2023-03-26: qty 1

## 2023-03-26 MED ORDER — PANTOPRAZOLE SODIUM 40 MG PO TBEC
40.0000 mg | DELAYED_RELEASE_TABLET | Freq: Every day | ORAL | Status: DC
  Administered 2023-03-26 – 2023-04-01 (×7): 40 mg via ORAL
  Filled 2023-03-26 (×7): qty 1

## 2023-03-26 NOTE — Progress Notes (Signed)
Mobility Specialist - Progress Note  (6L [place]) Pre-mobility: 91 bpm HR, 94% SpO2 During mobility: 102 bpm HR, 92% SpO2 Post-mobility: 92 bpm HR, 88% SPO2 Post-mobility: 88 bpm HR, 92% SPO2 (5L [place])    03/26/23 0952  Mobility  Activity Ambulated independently in hallway  Level of Assistance Independent  Assistive Device None  Distance Ambulated (ft) 100 ft  Range of Motion/Exercises Active  Activity Response Tolerated well  Mobility Referral Yes  Mobility visit 1 Mobility  Mobility Specialist Start Time (ACUTE ONLY) 0940  Mobility Specialist Stop Time (ACUTE ONLY) [id number]  Mobility Specialist Time Calculation (min) (ACUTE ONLY) 12 min   Pt was found in room and agreeable to ambulate. No complaints with session. At EOS returned to sit EOB with all needs met. Call bell in reach.  Billey Chang Mobility Specialist

## 2023-03-26 NOTE — Progress Notes (Signed)
PROGRESS NOTE    Mario [name]  EAV:[id number] DOB: 07-09-1950 DOA: 03/17/2023 PCP: [name]  Chief Complaint  Patient presents with   Shortness of Breath    Brief Narrative:   Mario [name] is Mario [name] 73 yo male with PMH COPD, OSA, lung cancer (with radiation tx), chronic hypoxia on home O2 4L, depression/anxiety, hx PE on Eliquis, morbid obesity, HTN, OSA on CPAP who presented with worsening shortness of breath and cough.  Found to have RSV infection.  Treating for RSV with presumed superimposed bacterial pneumonia as well as COPD exacerbation with steroids.   Assessment & Plan:   Principal Problem:   Community acquired pneumonia Active Problems:   COPD with acute exacerbation (HCC)   Acute on chronic hypoxic respiratory failure (HCC)   Acute hypoxic respiratory failure (HCC)   RSV (respiratory syncytial virus pneumonia)   Hyperkalemia   Essential hypertension   History of lung cancer   History of pulmonary embolus (PE)   RSV bronchitis  Acute on chronic hypoxic respiratory failure CAP RSV infection -Presented with shortness of breath, cough, some sputum and worsening SpO2 despite home O2 -Did endorse sick contacts at home with similar symptoms -positive for RSV -CT chest with chronic airspace disease at bases, progressed area in RLL since 11/03/2022 (superimposed infectious pneumonia or progression of chronic disease)  - [name] urine strep, urine legionella - MRSA positive - sputum cx from 1/24 pending, consider extended course of abx based on these results - today he coughed up some rusty sputum  - repeat CXR - s/p doxy, ceftriaxone.  CXR 1/23 with stable bibasilar consolidation.  Start extended abx course with unasyn, follow resp culture results.  - wean O2 as able     COPD exacerbation Chronic hypoxic respiratory failure 4 to 5 L oxygen at baseline -Severe exacerbation in setting of pneumonia and RSV infection. - steroid taper - repeat CXR 1/23 with  stable bibasilar consolidation  - continue O2    Chest Pain He notes chronic CP for years.  This episode was brief, about 3 min in duration, occurred  at rest.  No clear exacerbating/alleviating factors.  It's resolved by the time I saw him at bedside.  He notes chronic CP, lasting up to 30 min that would be worth outpatient follow up pending this inpatient workup. [name] troponin EKG with IVCD, T wave inversions in III, aVF Echo with EF 60-65%, no RWMA, grade 1 diastolic dysfunction  Hyperkalemia  resolved    History of lung cancer-status post radiation chemotherapy Patient reported history [facility].  Unable to retrieve any records on the chart.  Patient reported that he has been following oncology every 3 months and most recent CT scan in November 2024 showed no evidence of recurrent lung cancer.   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   Generalized anxiety disorder -Continue BuSpar 15 mg daily.  Continue Cymbalta 60 mg twice daily - resume trazodone for sleep at night    DMII - Holding metformin in acute setting - A1c 6.7% - Continue SSI + basal and CBG monitoring   Insomnia - Continue melatonin and trazodone    BPH - Continue Flomax  Constipation -- miralax, lactulose, senna -- suppository today -- fleet enema if needed  Obesity -- Body mass index is 40 kg/m.    DVT prophylaxis: eliquis Code Status: full Family Communication: none  Disposition:   Status is: Inpatient Remains inpatient appropriate because: need for inpatient care   Consultants:  none  Procedures:  Echo IMPRESSIONS     1. Left ventricular ejection fraction, by estimation, is 60 to 65%. The  left ventricle has normal function. The left ventricle has no regional  wall motion abnormalities. There is mild concentric left ventricular  hypertrophy.  Left ventricular diastolic  parameters are consistent with Grade I diastolic dysfunction (impaired  relaxation).   2. Right ventricular systolic function is normal. The right ventricular  size is normal. There is normal pulmonary artery systolic pressure.   3. The mitral valve is normal in structure. Trivial mitral valve  regurgitation. No evidence of mitral stenosis.   4. The aortic valve is normal in structure. Aortic valve regurgitation is  not visualized. No aortic stenosis is present.   5. The inferior vena cava is dilated in size with >50% respiratory  variability, suggesting right atrial pressure of 8 mmHg.   Antimicrobials:  Anti-infectives (From admission, onward)    Start     Dose/Rate Route Frequency Ordered Stop   03/20/23 2200  doxycycline (VIBRA-TABS) tablet 100 mg        100 mg Oral Every 12 hours 03/20/23 1133 03/23/23 2105   03/17/23 0500  doxycycline (VIBRAMYCIN) 100 mg in sodium chloride 0.9 % 250 mL IVPB        100 mg 125 mL/hr over 120 Minutes Intravenous Every 12 hours 03/17/23 0420 03/20/23 0737   03/17/23 0430  cefTRIAXone (ROCEPHIN) 2 g in sodium chloride 0.9 % 100 mL IVPB        2 g 200 mL/hr over 30 Minutes Intravenous Every 24 hours 03/17/23 0420 03/24/23 0721       Subjective: Coughed up some rusty sputum  Objective: Vitals:   03/25/23 2200 03/26/23 0310 03/26/23 0534 03/26/23 0800  BP:   136/78   Pulse:   78   Resp: 19     Temp:   97.9 F (36.6 C)   TempSrc:   Oral   SpO2:  93% 93% 96%  Weight:      Height:        Intake/Output Summary (Last 24 hours) at 03/26/2023 1032 Last data filed at 03/26/2023 0913 Gross per 24 hour  Intake 1376 ml  Output 2355 ml  Net -979 ml   Filed Weights   03/17/23 0108 03/17/23 1147  Weight: (!) 139.7 kg (!) 141.3 kg    Examination:  General: No acute distress. Cardiovascular: RRR Lungs: scattered wheezing Neurological: Alert and oriented 3. Moves all extremities 4 with equal strength. Cranial  nerves II through XII grossly intact. Extremities: No clubbing or cyanosis. No edema.  Data Reviewed: I have personally reviewed following labs and imaging studies  CBC: Recent Labs  Lab 03/21/23 0253 03/23/23 0455 03/24/23 0455 03/25/23 0551  WBC 15.4* 13.9* 13.1* 12.4*  NEUTROABS 12.5* 7.9* 9.1* 7.5  HGB 14.8 14.8 13.8 13.4  HCT 44.8 45.0 41.6 41.0  MCV 92.2 92.8 92.7 94.3  PLT 317 292 297 283    Basic Metabolic Panel: Recent Labs  Lab 03/21/23 0253 03/23/23 0455 03/24/23 0455 03/25/23 0551  NA 134* 134* 133* 136  K 4.3 3.8 4.0 4.3  CL 101 101 99 98  CO2 24 25 25 31   GLUCOSE 219* 156* 206* 162*  BUN 26* 28* 23 21  CREATININE 0.55* 0.84 0.85 0.94  CALCIUM 8.7* 8.5* 8.1* 8.4*  MG 2.1 2.0 2.1 2.2  PHOS  --  3.6 3.3 3.3    GFR: Estimated Creatinine Clearance: 107.9 mL/min (by C-G formula based on SCr of 0.94 mg/dL).  Liver Function Tests: Recent Labs  Lab 03/23/23 0455 03/24/23 0455 03/25/23 0551  AST 17 15 13*  ALT 33 27 7  ALKPHOS 64 59 60  BILITOT 0.4 0.3 0.5  PROT 6.5 6.2* 6.4*  ALBUMIN 3.2* 2.9* 3.3*    CBG: Recent Labs  Lab 03/25/23 0734 03/25/23 1154 03/25/23 1656 03/25/23 2040 03/26/23 0728  GLUCAP 161* 133* 223* 158* 118*     Recent Results (from the past 240 hours)  Resp panel by RT-PCR (RSV, Flu Ruthanne Mcneish&B, Covid) Anterior Nasal Swab     Status: Abnormal   Collection Time: 03/17/23  1:03 AM   Specimen: Anterior Nasal Swab  Result Value Ref Range Status   SARS Coronavirus 2 by RT PCR [name] [name] Final    Comment: (NOTE) SARS-CoV-2 target nucleic acids are NOT DETECTED.  The SARS-CoV-2 RNA is generally detectable in upper respiratory specimens during the acute phase of infection. The lowest concentration of SARS-CoV-2 viral copies this assay can detect is 138 copies/mL. Render Mario [name] result does not preclude SARS-Cov-2 infection and should not be used as the sole basis for treatment or other patient management decisions. Mario [name] [name]  result may occur with  improper specimen collection/handling, submission of specimen other than nasopharyngeal swab, presence of viral mutation(s) within the areas targeted by this assay, and inadequate number of viral copies(<138 copies/mL). Mario [name] [name] result must be combined with clinical observations, patient history, and epidemiological information. The expected result is [name].  Fact Sheet for Patients:  BloggerCourse.com  Fact Sheet for Healthcare Providers:  SeriousBroker.it  This test is no t yet approved or cleared by the [place] FDA and  has been authorized for detection and/or diagnosis of SARS-CoV-2 by FDA under an Emergency Use Authorization (EUA). This EUA will remain  in effect (meaning this test can be used) for the duration of the COVID-19 declaration under Section 564(b)(1) of the Act, 21 U.S.C.section 360bbb-3(b)(1), unless the authorization is terminated  or revoked sooner.       Influenza Mario [name] by PCR [name] [name] Final   Influenza B by PCR [name] [name] Final    Comment: (NOTE) The Xpert Xpress SARS-CoV-2/FLU/RSV plus assay is intended as an aid in the diagnosis of influenza from Nasopharyngeal swab specimens and should not be used as Mario [name] sole basis for treatment. Nasal washings and aspirates are unacceptable for Xpert Xpress SARS-CoV-2/FLU/RSV testing.  Fact Sheet for Patients: BloggerCourse.com  Fact Sheet for Healthcare Providers: SeriousBroker.it  This test is not yet approved or cleared by the [place] FDA and has been authorized for detection and/or diagnosis of SARS-CoV-2 by FDA under an Emergency Use Authorization (EUA). This EUA will remain in effect (meaning this test can be used) for the duration of the COVID-19 declaration under Section 564(b)(1) of the Act, 21 U.S.C. section 360bbb-3(b)(1), unless the authorization is  terminated or revoked.     Resp Syncytial Virus by PCR POSITIVE (Mario [name]) [name] Final    Comment: (NOTE) Fact Sheet for Patients: BloggerCourse.com  Fact Sheet for Healthcare Providers: SeriousBroker.it  This test is not yet approved or cleared by the [place] FDA and has been authorized for detection and/or diagnosis of SARS-CoV-2 by FDA under an Emergency Use Authorization (EUA). This EUA will remain in effect (meaning this test can be used) for the duration of the COVID-19 declaration under Section 564(b)(1) of the Act, 21 U.S.C. section  360bbb-3(b)(1), unless the authorization is terminated or revoked.  Performed at [facility], [address] [place], [place] [address]   Culture, blood (Routine X 2) w Reflex to ID Panel     Status: None   Collection Time: 03/17/23  5:15 AM   Specimen: BLOOD  Result Value Ref Range Status   Specimen Description   Final    BLOOD LEFT ANTECUBITAL Performed at [facility], [address] [place], [place] [address]    Special Requests   Final    BOTTLES DRAWN AEROBIC AND ANAEROBIC Blood Culture results may not be optimal due to an inadequate volume of blood received in culture bottles Performed at [facility], [address], [place], [place] [address]    Culture   Final    NO GROWTH 5 DAYS Performed at [facility], [address], [place], [place] [address]    Report Status 03/22/2023 FINAL  Final  Culture, blood (Routine X 2) w Reflex to ID Panel     Status: None   Collection Time: 03/17/23  5:30 AM   Specimen: BLOOD RIGHT HAND  Result Value Ref Range Status   Specimen Description   Final    BLOOD RIGHT HAND Performed at [facility], [address], [place], [place] [address]    Special Requests   Final    BOTTLES DRAWN AEROBIC AND ANAEROBIC Blood Culture results may not be optimal due to an  inadequate volume of blood received in culture bottles Performed at [facility], [address], [place], [place] [address]    Culture   Final    NO GROWTH 5 DAYS Performed at [facility], [address], [place], [place] [address]    Report Status 03/22/2023 FINAL  Final  MRSA Next Gen by PCR, Nasal     Status: Abnormal   Collection Time: 03/17/23 12:14 PM   Specimen: Nasal Mucosa; Nasal Swab  Result Value Ref Range Status   MRSA by PCR Next Gen DETECTED (Mario [name]) NOT DETECTED Final    Comment: RESULT CALLED TO, READ BACK BY AND VERIFIED WITH: CLAPP, B RN @ 1426 03/17/23. GILBERTL (NOTE) The GeneXpert MRSA Assay (FDA approved for NASAL specimens only), is one component of Mario [name] comprehensive MRSA colonization surveillance program. It is not intended to diagnose MRSA infection nor to guide or monitor treatment for MRSA infections. Test performance is not FDA approved in patients less than 37 years old. Performed at [facility], [address] [place], [place] [address]   Expectorated Sputum Assessment w Gram Stain, Rflx to Resp Cult     Status: None   Collection Time: 03/24/23  4:56 PM   Specimen: Expectorated Sputum  Result Value Ref Range Status   Specimen Description EXPECTORATED SPUTUM  Final   Special Requests NONE  Final   Sputum evaluation   Final    THIS SPECIMEN IS ACCEPTABLE FOR SPUTUM CULTURE Performed at [facility], [address] [place], [place] [address]    Report Status 03/24/2023 FINAL  Final  Culture, Respiratory w Gram Stain     Status: None (Preliminary result)   Collection Time: 03/24/23  4:56 PM  Result Value Ref Range Status   Specimen Description   Final    EXPECTORATED SPUTUM Performed at [facility], [address], [place], [place] [address]    Special Requests   Final    NONE Reflexed  from [id number] Performed at [facility], [address]  [place], [place] [address]    Gram Stain   Final    FEW WBC PRESENT, PREDOMINANTLY PMN RARE GRAM POSITIVE COCCI RARE BUDDING YEAST SEEN    Culture   Final    TOO YOUNG TO READ Performed at [facility], [address], [place], [place] [address]    Report Status PENDING  Incomplete         Radiology Studies: ECHOCARDIOGRAM COMPLETE Result Date: 03/24/2023    ECHOCARDIOGRAM REPORT   Patient Name:   Mario [name] [facility] Date of Exam: 03/24/2023 Medical Rec #:  [id number]        Height:       74.0 in Accession #:    [id number]       Weight:       311.5 lb Date of Birth:  [birth year]        BSA:          2.624 m Patient Age:    72 years         BP:           122/67 mmHg Patient Gender: M                HR:           97 bpm. Exam Location:  Inpatient Procedure: 2D Echo, Cardiac Doppler and Color Doppler Indications:    Chest pain  History:        Patient has prior history of Echocardiogram examinations, most                 recent 06/29/2021. COPD and CKD, Signs/Symptoms:Chest Pain; Risk                 Factors:Hypertension, Former Smoker and Diabetes.  Sonographer:    Dondra Prader RVT RCS Referring Phys: [phone] Keviana Guida CALDWELL POWELL JR  Sonographer Comments: Technically challenging study due to limited acoustic windows, Technically difficult study due to poor echo windows and patient is obese. Image acquisition challenging due to COPD and Image acquisition challenging due to patient body habitus. Technically limited and challenging due to very limited and poor windows IMPRESSIONS  1. Left ventricular ejection fraction, by estimation, is 60 to 65%. The left ventricle has normal function. The left ventricle has no regional wall motion abnormalities. There is mild concentric left ventricular hypertrophy. Left ventricular diastolic parameters are consistent with Grade I diastolic dysfunction (impaired relaxation).  2. Right ventricular systolic function is normal. The right ventricular size is normal. There is normal  pulmonary artery systolic pressure.  3. The mitral valve is normal in structure. Trivial mitral valve regurgitation. No evidence of mitral stenosis.  4. The aortic valve is normal in structure. Aortic valve regurgitation is not visualized. No aortic stenosis is present.  5. The inferior vena cava is dilated in size with >50% respiratory variability, suggesting right atrial pressure of 8 mmHg. FINDINGS  Left Ventricle: Left ventricular ejection fraction, by estimation, is 60 to 65%. The left ventricle has normal function. The left ventricle has no regional wall motion abnormalities. The left ventricular internal cavity size was normal in size. There is  mild concentric left ventricular hypertrophy. Left ventricular diastolic parameters are consistent with Grade I diastolic dysfunction (impaired relaxation). Right Ventricle: The right ventricular size is normal. No increase in right ventricular wall thickness. Right ventricular systolic function is normal. There is normal pulmonary artery systolic pressure. The tricuspid regurgitant velocity is  0.93 m/s, and  with an assumed right atrial pressure of 8 mmHg, the estimated right ventricular systolic pressure is 11.5 mmHg. Left Atrium: Left atrial size was normal in size. Right Atrium: Right atrial size was normal in size. Pericardium: There is no evidence of pericardial effusion. Mitral Valve: The mitral valve is normal in structure. Trivial mitral valve regurgitation. No evidence of mitral valve stenosis. Tricuspid Valve: The tricuspid valve is normal in structure. Tricuspid valve regurgitation is trivial. No evidence of tricuspid stenosis. Aortic Valve: The aortic valve is normal in structure. Aortic valve regurgitation is not visualized. No aortic stenosis is present. Aortic valve mean gradient measures 2.0 mmHg. Aortic valve peak gradient measures 3.9 mmHg. Aortic valve area, by VTI measures 3.00 cm. Pulmonic Valve: The pulmonic valve was normal in structure.  Pulmonic valve regurgitation is not visualized. No evidence of pulmonic stenosis. Aorta: The aortic root is normal in size and structure. Venous: The inferior vena cava is dilated in size with greater than 50% respiratory variability, suggesting right atrial pressure of 8 mmHg. IAS/Shunts: No atrial level shunt detected by color flow Doppler.  LEFT VENTRICLE PLAX 2D LVIDd:         5.40 cm LVIDs:         3.20 cm LV PW:         1.20 cm LV IVS:        1.10 cm LVOT diam:     2.20 cm LV SV:         47 LV SV Index:   18 LVOT Area:     3.80 cm  IVC IVC diam: 2.40 cm LEFT ATRIUM           Index LA diam:      3.80 cm 1.45 cm/m LA Vol (A4C): 32.8 ml 12.50 ml/m  AORTIC VALVE                    PULMONIC VALVE AV Area (Vmax):    3.22 cm     PV Vmax:       0.98 m/s AV Area (Vmean):   3.12 cm     PV Peak grad:  3.8 mmHg AV Area (VTI):     3.00 cm AV Vmax:           99.10 cm/s AV Vmean:          65.800 cm/s AV VTI:            0.157 m AV Peak Grad:      3.9 mmHg AV Mean Grad:      2.0 mmHg LVOT Vmax:         84.00 cm/s LVOT Vmean:        54.000 cm/s LVOT VTI:          0.124 m LVOT/AV VTI ratio: 0.79  AORTA Ao Root diam: 3.60 cm TRICUSPID VALVE TR Peak grad:   3.5 mmHg TR Vmax:        93.00 cm/s  SHUNTS Systemic VTI:  0.12 m Systemic Diam: 2.20 cm Chilton Si MD Electronically signed by Chilton Si MD Signature Date/Time: 03/24/2023/5:07:48 PM    Final         Scheduled Meds:  apixaban  5 mg Oral BID   arformoterol  15 mcg Nebulization BID   budesonide (PULMICORT) nebulizer solution  0.25 mg Nebulization BID   buPROPion  300 mg Oral q morning   busPIRone  15 mg Oral Daily   cholecalciferol  1,000 Units Oral  Daily   cyclobenzaprine  10 mg Oral QHS   diltiazem  180 mg Oral Daily   DULoxetine  60 mg Oral BID   folic acid  1 mg Oral Daily   gabapentin  100 mg Oral TID   guaiFENesin  600 mg Oral BID   insulin aspart  0-20 Units Subcutaneous TID WC   insulin aspart  0-5 Units Subcutaneous QHS   insulin  glargine-yfgn  5 Units Subcutaneous Daily   ipratropium-albuterol  3 mL Nebulization Q6H WA   lactulose  20 g Oral BID   melatonin  6 mg Oral QHS   OXcarbazepine  600 mg Oral TID   polyethylene glycol  17 g Oral TID   predniSONE  30 mg Oral Q breakfast   Followed by   [name] ON 03/29/2023] predniSONE  20 mg Oral Q breakfast   Followed by   [name] ON 04/01/2023] predniSONE  10 mg Oral Q breakfast   senna-docusate  2 tablet Oral QHS   sodium chloride flush  3 mL Intravenous Q12H   sodium chloride flush  3 mL Intravenous Q12H   tamsulosin  0.8 mg Oral Daily   traZODone  75 mg Oral QHS   Continuous Infusions:     LOS: 9 days    Time spent: 35 min    Lacretia Nicks, MD [facility]   To contact the attending provider between 7A-7P or the covering provider during after hours 7P-7A, please log into the web site [email or web address] and access using universal [place] password for that web site. If you do not have the password, please call the hospital operator.  03/26/2023, 10:32 AM

## 2023-03-26 NOTE — Progress Notes (Signed)
   03/26/23 2305  BiPAP/CPAP/SIPAP  BiPAP/CPAP/SIPAP Pt Type Adult  Reason BIPAP/CPAP not in use Non-compliant

## 2023-03-26 NOTE — Progress Notes (Signed)
Pharmacy Antibiotic Note  Mario [name] is a 73 y.o. male  with hx COPD who presented to the ED admitted on 03/17/2023 with SOB. He was treated with ceftriaxone and doxycycline from 1/17 to 1/23 for PNA. Pharmacy has been consulted on 1/26 to restart abx back with unasyn for PNA.  Today, 03/26/2023: - scr 0.94 (crcl~100) - afeb  Plan: - unasyn 3gm IV q6h - With good renal function, pharmacy will sign off for abx consult.  Reconsult [place] if need further assistance.   ____________________________________________  Height: 6\' 2"  (188 cm) Weight: (!) 141.3 kg (311 lb 8.2 oz) IBW/kg (Calculated) : 82.2  Temp (24hrs), Avg:98.5 F (36.9 C), Min:97.9 F (36.6 C), Max:98.7 F (37.1 C)  Recent Labs  Lab 03/21/23 0253 03/23/23 0455 03/24/23 0455 03/25/23 0551  WBC 15.4* 13.9* 13.1* 12.4*  CREATININE 0.55* 0.84 0.85 0.94    Estimated Creatinine Clearance: 107.9 mL/min (by C-G formula based on SCr of 0.94 mg/dL).    Allergies  Allergen Reactions   Demerol [Meperidine] Nausea And Vomiting and Other (See Comments)    Made the patient "violently sick"   Zocor [Simvastatin] Nausea And Vomiting and Other (See Comments)    Made him very jittery, also   Beet [Beta Vulgaris] Nausea And Vomiting   Liver Nausea And Vomiting     Thank you for allowing pharmacy to be a part of this patient's care.  Lucia Gaskins 03/26/2023 10:43 AM

## 2023-03-27 DIAGNOSIS — J9601 Acute respiratory failure with hypoxia: Secondary | ICD-10-CM | POA: Diagnosis not present

## 2023-03-27 LAB — CBC
HCT: 42.4 % (ref 39.0–52.0)
Hemoglobin: 14 g/dL (ref 13.0–17.0)
MCH: 30.7 pg (ref 26.0–34.0)
MCHC: 33 g/dL (ref 30.0–36.0)
MCV: 93 fL (ref 80.0–100.0)
Platelets: 302 10*3/uL (ref 150–400)
RBC: 4.56 MIL/uL (ref 4.22–5.81)
RDW: 13.6 % (ref 11.5–15.5)
WBC: 15.3 10*3/uL — ABNORMAL HIGH (ref 4.0–10.5)
nRBC: 0 % (ref 0.0–0.2)

## 2023-03-27 LAB — BRAIN NATRIURETIC PEPTIDE: B Natriuretic Peptide: 36.3 pg/mL (ref 0.0–100.0)

## 2023-03-27 LAB — BASIC METABOLIC PANEL
Anion gap: 9 (ref 5–15)
BUN: 17 mg/dL (ref 8–23)
CO2: 32 mmol/L (ref 22–32)
Calcium: 9.1 mg/dL (ref 8.9–10.3)
Chloride: 94 mmol/L — ABNORMAL LOW (ref 98–111)
Creatinine, Ser: 0.88 mg/dL (ref 0.61–1.24)
GFR, Estimated: >60 mL/min (ref >60)
Glucose, Bld: 137 mg/dL — ABNORMAL HIGH (ref 70–99)
Potassium: 4.7 mmol/L (ref 3.5–5.1)
Sodium: 135 mmol/L (ref 135–145)

## 2023-03-27 LAB — CULTURE, RESPIRATORY W GRAM STAIN

## 2023-03-27 LAB — GLUCOSE, CAPILLARY
Glucose-Capillary: 120 mg/dL — ABNORMAL HIGH (ref 70–99)
Glucose-Capillary: 161 mg/dL — ABNORMAL HIGH (ref 70–99)
Glucose-Capillary: 173 mg/dL — ABNORMAL HIGH (ref 70–99)
Glucose-Capillary: 149 mg/dL — ABNORMAL HIGH (ref 70–99)

## 2023-03-27 LAB — MAGNESIUM: Magnesium: 2.3 mg/dL (ref 1.7–2.4)

## 2023-03-27 LAB — PHOSPHORUS: Phosphorus: 3.4 mg/dL (ref 2.5–4.6)

## 2023-03-27 MED ORDER — FLEET ENEMA RE ENEM: 1.0000 | ENEMA | Freq: Every day | RECTAL | Status: DC | PRN

## 2023-03-27 NOTE — Evaluation (Signed)
Clinical/Bedside Swallow Evaluation Patient Details  Name: KARLIS CREGG MRN: [id number] Date of Birth: 12-12-1950  Today's Date: 03/27/2023 Time: SLP Start Time (ACUTE ONLY): 1215 SLP Stop Time (ACUTE ONLY): 1225 SLP Time Calculation (min) (ACUTE ONLY): 10 min  Past Medical History:  Past Medical History:  Diagnosis Date   Ankylosing spondylitis lumbar region (HCC) 09/25/2022   Anxiety    Bronchitis    COPD (chronic obstructive pulmonary disease) (HCC)    Depression    History of radiation therapy    Left lung- 10/05/20-10/15/20- Dr. Antony Blackbird   Hypertension    lung ca 09/2020   MI (myocardial infarction) [facility])    ????   On home oxygen therapy    4L/min [place]   OSA (obstructive sleep apnea)    Suicide attempt (HCC)    Tension pneumothorax 06/27/2016   Uveitis    Past Surgical History:  Past Surgical History:  Procedure Laterality Date   BIOPSY  07/03/2021   Procedure: BIOPSY;  Surgeon: Kathi Der, MD;  Location: [facility];  Service: Gastroenterology;;   BRONCHIAL BIOPSY  07/30/2020   Procedure: BRONCHIAL BIOPSIES;  Surgeon: Josephine Igo, DO;  Location: [facility];  Service: Pulmonary;;   BRONCHIAL BRUSHINGS  07/30/2020   Procedure: BRONCHIAL BRUSHINGS;  Surgeon: Josephine Igo, DO;  Location: [facility];  Service: Pulmonary;;   BRONCHIAL NEEDLE ASPIRATION BIOPSY  07/30/2020   Procedure: BRONCHIAL NEEDLE ASPIRATION BIOPSIES;  Surgeon: Josephine Igo, DO;  Location: [facility];  Service: Pulmonary;;   BRONCHIAL WASHINGS  07/30/2020   Procedure: BRONCHIAL WASHINGS;  Surgeon: Josephine Igo, DO;  Location: [facility];  Service: Pulmonary;;   CHEST TUBE INSERTION Left 06/27/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     ESOPHAGOGASTRODUODENOSCOPY N/A 07/03/2021   Procedure: ESOPHAGOGASTRODUODENOSCOPY (EGD);  Surgeon: Kathi Der, MD;  Location: [facility];  Service: Gastroenterology;  Laterality: N/A;   IR PERC PLEURAL DRAIN W/INDWELL CATH W/IMG GUIDE  08/04/2021   IR  REMOVAL OF PLURAL CATH W/CUFF  09/01/2021   SKIN CANCER EXCISION     VIDEO BRONCHOSCOPY WITH ENDOBRONCHIAL NAVIGATION Left 07/30/2020   Procedure: VIDEO BRONCHOSCOPY WITH ENDOBRONCHIAL NAVIGATION;  Surgeon: Josephine Igo, DO;  Location: [facility];  Service: Pulmonary;  Laterality: Left;   HPI:  Patient is a 73 y.o. male with PMH: COPD, OSA on CPAP, lung cancer (getting radiation treatment), chronic hypoxia on home O2 at 4L, depression/anxiety, h/o PE, HTN, GERD, EGD in 2023 which reported non bleeding gastric ulcers and esophagitis. He presented to the hospital on 03/17/23 with worsening SOB and cough. He was found to have RSV infection with presumed superimposed bacterial pneumonia as well as COPD exacerbation with steroids. Repeat CXR on 03/26/23 showed increasing bibasilar opacities which may represent PNA. SLP swallow evaluation ordered on 03/27/23.    Assessment / Plan / Recommendation  Clinical Impression  Patient is not currently presenting with clinical s/s of dysphagia as per this this bedside swallow evaluation. He denies any difficulty swallowing and although he does acknowledge h/o GERD, he indicated that this only affected him every once in a while. His voice was clear and strong and swallow initiation was timely with sips of water. SLP reviewed patient's chart including EGD that was performed in 2023 and reported non bleeding gastric ulcers and esophagitis. Patient is at very low risk of aspiration during or immediately after PO intake, but he is at risk for post-prandial aspiration when he is reclined and/or sleeping. SLP not recommending any further skilled intervention at this time.  Patient would likely benefit from following general Reflux/GERD precautions. SLP Visit Diagnosis: Dysphagia, unspecified (R13.10)    Aspiration Risk  No limitations    Diet Recommendation Regular;Thin liquid    Liquid Administration via: Cup;Straw Medication Administration: Whole meds with  liquid Supervision: Patient able to self feed Postural Changes: Seated upright at 90 degrees    Other  Recommendations Oral Care Recommendations: Oral care BID    Recommendations for follow up therapy are one component of a multi-disciplinary discharge planning process, led by the attending physician.  Recommendations may be updated based on patient status, additional functional criteria and insurance authorization.  Follow up Recommendations No SLP follow up      Assistance Recommended at Discharge    Functional Status Assessment Patient has not had a recent decline in their functional status  Frequency and Duration   N/A         Prognosis   N/A     Swallow Study   General Date of Onset: 03/27/23 HPI: Patient is a 73 y.o. male with PMH: COPD, OSA on CPAP, lung cancer (getting radiation treatment), chronic hypoxia on home O2 at 4L, depression/anxiety, h/o PE, HTN, GERD, EGD in 2023 which reported non bleeding gastric ulcers and esophagitis. He presented to the hospital on 03/17/23 with worsening SOB and cough. He was found to have RSV infection with presumed superimposed bacterial pneumonia as well as COPD exacerbation with steroids. Repeat CXR on 03/26/23 showed increasing bibasilar opacities which may represent PNA. SLP swallow evaluation ordered on 03/27/23. Type of Study: Bedside Swallow Evaluation Previous Swallow Assessment: none found Diet Prior to this Study: Regular;Thin liquids (Level 0) Temperature Spikes Noted: No Respiratory Status: Nasal cannula History of Recent Intubation: No Behavior/Cognition: Alert;Cooperative;Pleasant mood Oral Cavity Assessment: Within Functional Limits Oral Care Completed by SLP: No Oral Cavity - Dentition: Adequate natural dentition Vision: Functional for self-feeding Self-Feeding Abilities: Able to feed self Patient Positioning: Upright in bed Baseline Vocal Quality: Normal Volitional Cough: Strong Volitional Swallow: Able to elicit     Oral/Motor/Sensory Function Overall Oral Motor/Sensory Function: Within functional limits   Ice Chips     Thin Liquid Thin Liquid: Within functional limits Presentation: Self Fed    Nectar Thick Nectar Thick Liquid: Not tested   Honey Thick Honey Thick Liquid: Not tested   Puree Puree: Not tested   Solid     Solid: Not tested      Angela Nevin, MA, CCC-SLP Speech Therapy

## 2023-03-27 NOTE — Progress Notes (Signed)
PROGRESS NOTE    Mario [name]  WUJ:[id number] DOB: 1951/01/10 DOA: 03/17/2023 [facility]CP: [name]  Chief Complaint  Patient presents with   Shortness of Breath    Brief Narrative:   Mario [name] is Mario [name] 73 yo male with PMH COPD, OSA, lung cancer (with radiation tx), chronic hypoxia on home O2 4L, depression/anxiety, hx PE on Eliquis, morbid obesity, HTN, OSA on CPAP who presented with worsening shortness of breath and cough.  Found to have RSV infection.  Treating for RSV with presumed superimposed bacterial pneumonia as well as COPD exacerbation with steroids.   Assessment & Plan:   Principal Problem:   Community acquired pneumonia Active Problems:   COPD with acute exacerbation (HCC)   Acute on chronic hypoxic respiratory failure (HCC)   Acute hypoxic respiratory failure (HCC)   RSV (respiratory syncytial virus pneumonia)   Hyperkalemia   Essential hypertension   History of lung cancer   History of pulmonary embolus (PE)   RSV bronchitis  Acute on chronic hypoxic respiratory failure CAP RSV infection -Presented with shortness of breath, cough, some sputum and worsening SpO2 despite home O2 -Did endorse sick contacts at home with similar symptoms -positive for RSV -CT chest with chronic airspace disease at bases, progressed area in RLL since 11/03/2022 (superimposed infectious pneumonia or progression of chronic disease)  - negative urine strep, urine legionella - MRSA positive (treated with doxy earlier) - sputum cx from 1/24 pending, adjust abx based on these results - repeat CXR with increasing bibasilar opacities, possible pneumonia - s/p doxy, ceftriaxone.   - wean O2 as able     COPD exacerbation Chronic hypoxic respiratory failure 4 to 5 L oxygen at baseline -Severe exacerbation in setting of pneumonia and RSV infection. - steroid taper - repeat CXR 1/23 with stable bibasilar consolidation  - continue O2    Chest Pain He notes chronic CP for years.   This episode was brief, about 3 min in duration, occurred  at rest.  No clear exacerbating/alleviating factors.  It's resolved by the time I saw him at bedside.  He notes chronic CP, lasting up to 30 min that would be worth outpatient follow up pending this inpatient workup. Negative troponin EKG with IVCD, T wave inversions in III, aVF Echo with EF 60-65%, no RWMA, grade 1 diastolic dysfunction  Hyperkalemia  resolved    History of lung cancer-status post radiation chemotherapy Patient reported history of lung [facility].  Unable to retrieve any records on the chart.  Patient reported that he has been following oncology every 3 months and most recent CT scan in November 2024 showed no evidence of recurrent lung cancer.   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   Generalized anxiety disorder -Continue BuSpar 15 mg daily.  Continue Cymbalta 60 mg twice daily - resume trazodone for sleep at night    DMII - Holding metformin in acute setting - A1c 6.7% - Continue SSI + basal and CBG monitoring   Insomnia - Continue melatonin and trazodone    BPH - Continue Flomax  Constipation -- miralax, lactulose, senna -- suppository today -- fleet enema if needed  Obesity -- Body mass index is 40 kg/m.    DVT prophylaxis: eliquis Code Status: full Family Communication: none Disposition:   Status is: Inpatient Remains inpatient appropriate because: need for inpatient care   Consultants:  none  Procedures:  Echo IMPRESSIONS     1. Left ventricular ejection fraction, by estimation, is 60 to 65%. The  left ventricle has normal function. The left ventricle has no regional  wall motion abnormalities. There is mild concentric left ventricular  hypertrophy. Left ventricular diastolic  parameters are consistent with Grade I diastolic dysfunction  (impaired  relaxation).   2. Right ventricular systolic function is normal. The right ventricular  size is normal. There is normal pulmonary artery systolic pressure.   3. The mitral valve is normal in structure. Trivial mitral valve  regurgitation. No evidence of mitral stenosis.   4. The aortic valve is normal in structure. Aortic valve regurgitation is  not visualized. No aortic stenosis is present.   5. The inferior vena cava is dilated in size with >50% respiratory  variability, suggesting right atrial pressure of 8 mmHg.   Antimicrobials:  Anti-infectives (From admission, onward)    Start     Dose/Rate Route Frequency Ordered Stop   03/26/23 1200  Ampicillin-Sulbactam (UNASYN) 3 g in sodium chloride 0.9 % 100 mL IVPB        3 g 200 mL/hr over 30 Minutes Intravenous Every 6 hours 03/26/23 1049     03/20/23 2200  doxycycline (VIBRA-TABS) tablet 100 mg        100 mg Oral Every 12 hours 03/20/23 1133 03/23/23 2105   03/17/23 0500  doxycycline (VIBRAMYCIN) 100 mg in sodium chloride 0.9 % 250 mL IVPB        100 mg 125 mL/hr over 120 Minutes Intravenous Every 12 hours 03/17/23 0420 03/20/23 0737   03/17/23 0430  cefTRIAXone (ROCEPHIN) 2 g in sodium chloride 0.9 % 100 mL IVPB        2 g 200 mL/hr over 30 Minutes Intravenous Every 24 hours 03/17/23 0420 03/24/23 0721       Subjective: Coughing up brown sputum Still constipated  Objective: Vitals:   03/26/23 1300 03/26/23 2022 03/27/23 0519 03/27/23 0926  BP:  (!) 147/82 135/79   Pulse:  93 78   Resp:  20 20   Temp: 98.6 F (37 C) 98.7 F (37.1 C) 98 F (36.7 C)   TempSrc: Oral Oral Oral   SpO2:  91% 97% 98%  Weight:      Height:        Intake/Output Summary (Last 24 hours) at 03/27/2023 1006 Last data filed at 03/27/2023 0852 Gross per 24 hour  Intake 1499.87 ml  Output 2520 ml  Net -1020.13 ml   Filed Weights   03/17/23 0108 03/17/23 1147  Weight: (!) 139.7 kg (!) 141.3 kg    Examination:  General: No acute  distress. Cardiovascular: RRR Lungs: diminished, R>L Neurological: Alert and oriented 3. Moves all extremities 4 with equal strength. Cranial nerves II through XII grossly intact. Extremities: No clubbing or cyanosis. No edema.  Data Reviewed: I have personally reviewed following labs and imaging studies  CBC: Recent Labs  Lab 03/21/23 0253 03/23/23 0455 03/24/23 0455 03/25/23 0551  WBC 15.4* 13.9* 13.1* 12.4*  NEUTROABS 12.5* 7.9* 9.1* 7.5  HGB 14.8 14.8 13.8 13.4  HCT 44.8 45.0 41.6 41.0  MCV 92.2 92.8 92.7 94.3  PLT 317 292 297 283    Basic Metabolic Panel: Recent Labs  Lab 03/21/23 0253 03/23/23 0455 03/24/23 0455 03/25/23 0551  NA 134* 134* 133* 136  K 4.3 3.8 4.0 4.3  CL 101 101 99 98  CO2 24 25 25 31   GLUCOSE 219* 156* 206* 162*  BUN 26* 28* 23 21  CREATININE 0.55* 0.84 0.85 0.94  CALCIUM 8.7* 8.5* 8.1* 8.4*  MG 2.1 2.0 2.1 2.2  PHOS  --  3.6 3.3 3.3    GFR: Estimated Creatinine Clearance: 107.9 mL/min (by C-G formula based on SCr of 0.94 mg/dL).  Liver Function Tests: Recent Labs  Lab 03/23/23 0455 03/24/23 0455 03/25/23 0551  AST 17 15 13*  ALT 33 27 7  ALKPHOS 64 59 60  BILITOT 0.4 0.3 0.5  PROT 6.5 6.2* 6.4*  ALBUMIN 3.2* 2.9* 3.3*    CBG: Recent Labs  Lab 03/26/23 0728 03/26/23 1150 03/26/23 1728 03/26/23 2104 03/27/23 0743  GLUCAP 118* 146* 202* 123* 120*     Recent Results (from the past 240 hours)  MRSA Next Gen by PCR, Nasal     Status: Abnormal   Collection Time: 03/17/23 12:14 PM   Specimen: Nasal Mucosa; Nasal Swab  Result Value Ref Range Status   MRSA by PCR Next Gen DETECTED (Ibrahim Mcpheeters) NOT DETECTED Final    Comment: RESULT CALLED TO, READ BACK BY AND VERIFIED WITH: CLAPP, B RN @ 1426 03/17/23. GILBERTL (NOTE) The GeneXpert MRSA Assay (FDA approved for NASAL specimens only), is one component of Alaisha Eversley comprehensive MRSA colonization surveillance program. It is not intended to diagnose MRSA infection nor to guide or monitor  treatment for MRSA infections. Test performance is not FDA approved in patients less than 14 years old. Performed at [facility], [address], [place], [place] [address]   Expectorated Sputum Assessment w Gram Stain, Rflx to Resp Cult     Status: None   Collection Time: 03/24/23  4:56 PM   Specimen: Expectorated Sputum  Result Value Ref Range Status   Specimen Description EXPECTORATED SPUTUM  Final   Special Requests NONE  Final   Sputum evaluation   Final    THIS SPECIMEN IS ACCEPTABLE FOR SPUTUM CULTURE Performed at [facility], [address] [place], [place] [address]    Report Status 03/24/2023 FINAL  Final  Culture, Respiratory w Gram Stain     Status: None (Preliminary result)   Collection Time: 03/24/23  4:56 PM  Result Value Ref Range Status   Specimen Description   Final    EXPECTORATED SPUTUM Performed at [facility], [address], [place], [place] [address]    Special Requests   Final    NONE Reflexed from [id number] Performed at [facility], [address] [place], [place] [address]    Gram Stain   Final    FEW WBC PRESENT, PREDOMINANTLY PMN RARE GRAM POSITIVE COCCI RARE BUDDING YEAST SEEN    Culture   Final    CULTURE REINCUBATED FOR BETTER GROWTH Performed at [facility], [address] [place], [place] [address]    Report Status PENDING  Incomplete         Radiology Studies: DG Chest 2 View Result Date: 03/26/2023 CLINICAL DATA:  Cough.  Chronic hypoxia. EXAM: CHEST - 2 VIEW COMPARISON:  Radiograph 03/23/2023, CT 03/17/2023 FINDINGS: Stable heart size and mediastinal contours. Chronic volume loss in the left hemithorax. Chronic hyperinflation, emphysema and bronchial thickening. Increasing opacity at the right lung base. Equivocal worsening of left lung base opacity. No large pleural effusion. No pneumothorax. IMPRESSION: 1. Increasing bibasilar opacities which may  represent pneumonia in the appropriate clinical setting. 2. Emphysema with chronic hyperinflation and bronchial thickening. Electronically Signed   By: Ivette Loyal.D.  On: 03/26/2023 15:29        Scheduled Meds:  apixaban  5 mg Oral BID   arformoterol  15 mcg Nebulization BID   budesonide (PULMICORT) nebulizer solution  0.25 mg Nebulization BID   buPROPion  300 mg Oral q morning   busPIRone  15 mg Oral Daily   cholecalciferol  1,000 Units Oral Daily   cyclobenzaprine  10 mg Oral QHS   diltiazem  180 mg Oral Daily   DULoxetine  60 mg Oral BID   folic acid  1 mg Oral Daily   gabapentin  100 mg Oral TID   guaiFENesin  600 mg Oral BID   insulin aspart  0-20 Units Subcutaneous TID WC   insulin aspart  0-5 Units Subcutaneous QHS   insulin glargine-yfgn  5 Units Subcutaneous Daily   ipratropium-albuterol  3 mL Nebulization Q6H WA   lactulose  20 g Oral BID   melatonin  6 mg Oral QHS   OXcarbazepine  600 mg Oral TID   pantoprazole  40 mg Oral Daily   polyethylene glycol  17 g Oral TID   predniSONE  30 mg Oral Q breakfast   Followed by   [name] ON 03/29/2023] predniSONE  20 mg Oral Q breakfast   Followed by   [name] ON 04/01/2023] predniSONE  10 mg Oral Q breakfast   senna-docusate  2 tablet Oral QHS   sodium chloride flush  3 mL Intravenous Q12H   sodium chloride flush  3 mL Intravenous Q12H   tamsulosin  0.8 mg Oral Daily   traZODone  75 mg Oral QHS   Continuous Infusions:  ampicillin-sulbactam (UNASYN) IV 3 g (03/27/23 0521)      LOS: 10 days    Time spent: 35 min    Lacretia Nicks, MD [facility]   To contact the attending provider between 7A-7P or the covering provider during after hours 7P-7A, please log into the web site [email or web address] and access using universal [place] password for that web site. If you do not have the password, please call the hospital operator.  03/27/2023, 10:06 AM

## 2023-03-27 NOTE — Progress Notes (Signed)
Mobility Specialist - Progress Note  Pre-mobility: 104 bpm HR, 94% SpO2 (5L [place]) During mobility: 115 bpm HR, 88% SpO2 (6L [place]) Post-mobility: 102 bpm HR, 92% SPO2 (5L [place])   03/27/23 1540  Mobility  Activity Ambulated independently in hallway  Level of Assistance Independent  Assistive Device None  Distance Ambulated (ft) 100 ft  Range of Motion/Exercises Active  Activity Response Tolerated fair  Mobility Referral Yes  Mobility visit 1 Mobility  Mobility Specialist Start Time (ACUTE ONLY) 1527  Mobility Specialist Stop Time (ACUTE ONLY) 1540  Mobility Specialist Time Calculation (min) (ACUTE ONLY) 13 min   Pt was found in bed and agreeable to ambulate. Had x1 brief standing rest break and stated feeling lightheaded. SPO2 checked to be 88%, able to increase within 1 min. At EOS returned to bed with all needs met. Call bell in reach.   Billey Chang Mobility Specialist

## 2023-03-28 DIAGNOSIS — J9601 Acute respiratory failure with hypoxia: Secondary | ICD-10-CM | POA: Diagnosis not present

## 2023-03-28 LAB — CBC WITH DIFFERENTIAL/PLATELET
Abs Immature Granulocytes: 0.11 10*3/uL — ABNORMAL HIGH (ref 0.00–0.07)
Basophils Absolute: 0.1 10*3/uL (ref 0.0–0.1)
Basophils Relative: 0 %
Eosinophils Absolute: 0.3 10*3/uL (ref 0.0–0.5)
Eosinophils Relative: 2 %
HCT: 40.2 % (ref 39.0–52.0)
Hemoglobin: 13.2 g/dL (ref 13.0–17.0)
Immature Granulocytes: 1 %
Lymphocytes Relative: 22 %
Lymphs Abs: 3 10*3/uL (ref 0.7–4.0)
MCH: 30.6 pg (ref 26.0–34.0)
MCHC: 32.8 g/dL (ref 30.0–36.0)
MCV: 93.1 fL (ref 80.0–100.0)
Monocytes Absolute: 1.3 10*3/uL — ABNORMAL HIGH (ref 0.1–1.0)
Monocytes Relative: 9 %
Neutro Abs: 8.9 10*3/uL — ABNORMAL HIGH (ref 1.7–7.7)
Neutrophils Relative %: 66 %
Platelets: 273 10*3/uL (ref 150–400)
RBC: 4.32 MIL/uL (ref 4.22–5.81)
RDW: 13.5 % (ref 11.5–15.5)
WBC: 13.6 10*3/uL — ABNORMAL HIGH (ref 4.0–10.5)
nRBC: 0 % (ref 0.0–0.2)

## 2023-03-28 LAB — COMPREHENSIVE METABOLIC PANEL
ALT: 22 U/L (ref 0–44)
AST: 15 U/L (ref 15–41)
Albumin: 3.2 g/dL — ABNORMAL LOW (ref 3.5–5.0)
Alkaline Phosphatase: 62 U/L (ref 38–126)
Anion gap: 8 (ref 5–15)
BUN: 23 mg/dL (ref 8–23)
CO2: 29 mmol/L (ref 22–32)
Calcium: 8.4 mg/dL — ABNORMAL LOW (ref 8.9–10.3)
Chloride: 96 mmol/L — ABNORMAL LOW (ref 98–111)
Creatinine, Ser: 1.05 mg/dL (ref 0.61–1.24)
GFR, Estimated: >60 mL/min (ref >60)
Glucose, Bld: 167 mg/dL — ABNORMAL HIGH (ref 70–99)
Potassium: 4.4 mmol/L (ref 3.5–5.1)
Sodium: 133 mmol/L — ABNORMAL LOW (ref 135–145)
Total Bilirubin: 0.5 mg/dL (ref 0.0–1.2)
Total Protein: 6.4 g/dL — ABNORMAL LOW (ref 6.5–8.1)

## 2023-03-28 LAB — GLUCOSE, CAPILLARY
Glucose-Capillary: 111 mg/dL — ABNORMAL HIGH (ref 70–99)
Glucose-Capillary: 175 mg/dL — ABNORMAL HIGH (ref 70–99)
Glucose-Capillary: 159 mg/dL — ABNORMAL HIGH (ref 70–99)
Glucose-Capillary: 167 mg/dL — ABNORMAL HIGH (ref 70–99)

## 2023-03-28 LAB — PHOSPHORUS: Phosphorus: 3.8 mg/dL (ref 2.5–4.6)

## 2023-03-28 LAB — MAGNESIUM: Magnesium: 2.3 mg/dL (ref 1.7–2.4)

## 2023-03-28 MED ORDER — IPRATROPIUM-ALBUTEROL 0.5-2.5 (3) MG/3ML IN SOLN
3.0000 mL | Freq: Two times a day (BID) | RESPIRATORY_TRACT | Status: DC
  Administered 2023-03-29 – 2023-04-01 (×7): 3 mL via RESPIRATORY_TRACT
  Filled 2023-03-28 (×7): qty 3

## 2023-03-28 MED ORDER — DOCUSATE SODIUM 100 MG PO CAPS
100.0000 mg | ORAL_CAPSULE | Freq: Two times a day (BID) | ORAL | Status: DC
  Administered 2023-03-28 – 2023-04-01 (×8): 100 mg via ORAL
  Filled 2023-03-28 (×8): qty 1

## 2023-03-28 MED ORDER — FLEET ENEMA RE ENEM
1.0000 | ENEMA | Freq: Once | RECTAL | Status: AC
  Administered 2023-03-28: 1 via RECTAL
  Filled 2023-03-28: qty 1

## 2023-03-28 NOTE — Progress Notes (Signed)
PROGRESS NOTE    Mario [name]  ZOX:[id number] DOB: [birth year] DOA: 03/17/2023 [facility]CP: [name]  Chief Complaint  Patient presents with   Shortness of Breath    Brief Narrative:   Mr. Mario [name] is Mario [name] 73 yo male with PMH COPD, OSA, lung cancer (with radiation tx), chronic hypoxia on home O2 4L, depression/anxiety, hx PE on Eliquis, morbid obesity, HTN, OSA on CPAP who presented with worsening shortness of breath and cough.  Found to have RSV infection.  Treating for RSV with presumed superimposed bacterial pneumonia as well as COPD exacerbation with steroids.   Assessment & Plan:   Principal Problem:   Community acquired pneumonia Active Problems:   COPD with acute exacerbation (HCC)   Acute on chronic hypoxic respiratory failure (HCC)   Acute hypoxic respiratory failure (HCC)   RSV (respiratory syncytial virus pneumonia)   Hyperkalemia   Essential hypertension   History of lung cancer   History of pulmonary embolus (PE)   RSV bronchitis  Acute on chronic hypoxic respiratory failure CAP RSV infection -Presented with shortness of breath, cough, some sputum and worsening SpO2 despite home O2 -Did endorse sick contacts at home with similar symptoms -positive for RSV -CT chest with chronic airspace disease at bases, progressed area in RLL since 11/03/2022 (superimposed infectious pneumonia or progression of chronic disease)  - negative urine strep, urine legionella - MRSA positive (treated with doxy earlier) - sputum cx from 1/24 moderate normal resp flora - CXR 1/26 with increasing bibasilar opacities which may represent pneumonia - s/p doxy, ceftriaxone.  Extending course with unasyn.  Plan to stop after 5 total days.  - wean O2 as able     COPD exacerbation Chronic hypoxic respiratory failure 4 to 5 L oxygen at baseline -Severe exacerbation in setting of pneumonia and RSV infection. - steroid taper - continue O2  - finally he's closer to his home o2 needs,  hopefully he'll be ready for d/c soon.   Chest Pain He notes chronic CP for years.  This episode was brief, about 3 min in duration, occurred  at rest.  No clear exacerbating/alleviating factors.  It's resolved by the time I saw him at bedside.  He notes chronic CP, lasting up to 30 min that would be worth outpatient follow up pending this inpatient workup. Negative troponin EKG with IVCD, T wave inversions in III, aVF Echo with EF 60-65%, no RWMA, grade 1 diastolic dysfunction  Hyperkalemia  resolved    History of lung cancer-status post radiation chemotherapy Patient reported [facility]  Unable to retrieve any records on the chart.  Patient reported that he has been following oncology every 3 months and most recent CT scan in November 2024 showed no evidence of recurrent lung cancer.   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   Generalized anxiety disorder -Continue BuSpar 15 mg daily.  Continue Cymbalta 60 mg twice daily - resume trazodone for sleep at night    DMII - Holding metformin in acute setting - A1c 6.7% - Continue SSI + basal and CBG monitoring   Insomnia - Continue melatonin and trazodone    BPH - Continue Flomax  Constipation -- miralax, lactulose, senna -- fleet enema  Obesity -- Body mass index is 40 kg/m.    DVT prophylaxis: eliquis Code Status: full Family Communication: none Disposition:   Status is: Inpatient  Remains inpatient appropriate because: need for inpatient care   Consultants:  none  Procedures:  Echo IMPRESSIONS     1. Left ventricular ejection fraction, by estimation, is 60 to 65%. The  left ventricle has normal function. The left ventricle has no regional  wall motion abnormalities. There is mild concentric left ventricular  hypertrophy. Left ventricular diastolic  parameters  are consistent with Grade I diastolic dysfunction (impaired  relaxation).   2. Right ventricular systolic function is normal. The right ventricular  size is normal. There is normal pulmonary artery systolic pressure.   3. The mitral valve is normal in structure. Trivial mitral valve  regurgitation. No evidence of mitral stenosis.   4. The aortic valve is normal in structure. Aortic valve regurgitation is  not visualized. No aortic stenosis is present.   5. The inferior vena cava is dilated in size with >50% respiratory  variability, suggesting right atrial pressure of 8 mmHg.   Antimicrobials:  Anti-infectives (From admission, onward)    Start     Dose/Rate Route Frequency Ordered Stop   03/26/23 1200  Ampicillin-Sulbactam (UNASYN) 3 g in sodium chloride 0.9 % 100 mL IVPB        3 g 200 mL/hr over 30 Minutes Intravenous Every 6 hours 03/26/23 1049     03/20/23 2200  doxycycline (VIBRA-TABS) tablet 100 mg        100 mg Oral Every 12 hours 03/20/23 1133 03/23/23 2105   03/17/23 0500  doxycycline (VIBRAMYCIN) 100 mg in sodium chloride 0.9 % 250 mL IVPB        100 mg 125 mL/hr over 120 Minutes Intravenous Every 12 hours 03/17/23 0420 03/20/23 0737   03/17/23 0430  cefTRIAXone (ROCEPHIN) 2 g in sodium chloride 0.9 % 100 mL IVPB        2 g 200 mL/hr over 30 Minutes Intravenous Every 24 hours 03/17/23 0420 03/24/23 0721       Subjective: Still not feelling his baseline Wants to get back home to play his games  Objective: Vitals:   03/27/23 2151 03/28/23 0443 03/28/23 1141 03/28/23 1403  BP:  (!) 141/85 125/72   Pulse:  91 (!) 101   Resp: (!) 22 19 20    Temp:  98 F (36.7 C) 98 F (36.7 C)   TempSrc:  Oral Oral   SpO2: 92% 94% 96% 92%  Weight:      Height:        Intake/Output Summary (Last 24 hours) at 03/28/2023 1745 Last data filed at 03/28/2023 1603 Gross per 24 hour  Intake 1320.13 ml  Output 2825 ml  Net -1504.87 ml   Filed Weights   03/17/23 0108 03/17/23 1147   Weight: (!) 139.7 kg (!) 141.3 kg    Examination:  General: No acute distress. Cardiovascular: RRR Lungs: scattered coarse lung sounds, diminished Neurological: Alert and oriented 3. Moves all extremities 4 with equal strength. Cranial nerves II through XII grossly intact. Extremities: No clubbing or cyanosis. No edema.   Data Reviewed: I have personally reviewed following labs and imaging studies  CBC: Recent Labs  Lab 03/23/23 0455 03/24/23 0455 03/25/23 0551 03/27/23 1046 03/28/23 0532  WBC 13.9* 13.1* 12.4* 15.3* 13.6*  NEUTROABS 7.9* 9.1* 7.5  --  8.9*  HGB 14.8 13.8 13.4 14.0 13.2  HCT 45.0 41.6 41.0 42.4 40.2  MCV 92.8 92.7 94.3 93.0 93.1  PLT 292 297 283 302 273    Basic Metabolic Panel: Recent Labs  Lab 03/23/23 0455 03/24/23 0455  03/25/23 0551 03/27/23 1046 03/28/23 0532  NA 134* 133* 136 135 133*  K 3.8 4.0 4.3 4.7 4.4  CL 101 99 98 94* 96*  CO2 25 25 31  32 29  GLUCOSE 156* 206* 162* 137* 167*  BUN 28* 23 21 17 23   CREATININE 0.84 0.85 0.94 0.88 1.05  CALCIUM 8.5* 8.1* 8.4* 9.1 8.4*  MG 2.0 2.1 2.2 2.3 2.3  PHOS 3.6 3.3 3.3 3.4 3.8    GFR: Estimated Creatinine Clearance: 96.6 mL/min (by C-G formula based on SCr of 1.05 mg/dL).  Liver Function Tests: Recent Labs  Lab 03/23/23 0455 03/24/23 0455 03/25/23 0551 03/28/23 0532  AST 17 15 13* 15  ALT 33 27 7 22   ALKPHOS 64 59 60 62  BILITOT 0.4 0.3 0.5 0.5  PROT 6.5 6.2* 6.4* 6.4*  ALBUMIN 3.2* 2.9* 3.3* 3.2*    CBG: Recent Labs  Lab 03/27/23 1655 03/27/23 1949 03/28/23 0726 03/28/23 1138 03/28/23 1555  GLUCAP 173* 149* 111* 175* 159*     Recent Results (from the past 240 hours)  Expectorated Sputum Assessment w Gram Stain, Rflx to Resp Cult     Status: None   Collection Time: 03/24/23  4:56 PM   Specimen: Expectorated Sputum  Result Value Ref Range Status   Specimen Description EXPECTORATED SPUTUM  Final   Special Requests NONE  Final   Sputum evaluation   Final    THIS  SPECIMEN IS ACCEPTABLE FOR SPUTUM CULTURE Performed at [facility], [address], [place], [place] [address]    Report Status 03/24/2023 FINAL  Final  Culture, Respiratory w Gram Stain     Status: None   Collection Time: 03/24/23  4:56 PM  Result Value Ref Range Status   Specimen Description   Final    EXPECTORATED SPUTUM Performed at [facility], [address], [place], [place] [address]    Special Requests   Final    NONE Reflexed from [id number] Performed at [facility], [address] [place], [place] [address]    Gram Stain   Final    FEW WBC PRESENT, PREDOMINANTLY PMN RARE GRAM POSITIVE COCCI RARE BUDDING YEAST SEEN    Culture   Final    MODERATE Normal respiratory flora-no Staph aureus or Pseudomonas seen Performed at [facility], [address] [place], [place] [address]    Report Status 03/27/2023 FINAL  Final         Radiology Studies: No results found.       Scheduled Meds:  apixaban  5 mg Oral BID   arformoterol  15 mcg Nebulization BID   budesonide (PULMICORT) nebulizer solution  0.25 mg Nebulization BID   buPROPion  300 mg Oral q morning   busPIRone  15 mg Oral Daily   cholecalciferol  1,000 Units Oral Daily   cyclobenzaprine  10 mg Oral QHS   diltiazem  180 mg Oral Daily   docusate sodium  100 mg Oral BID   DULoxetine  60 mg Oral BID   folic acid  1 mg Oral Daily   gabapentin  100 mg Oral TID   guaiFENesin  600 mg Oral BID   insulin aspart  0-20 Units Subcutaneous TID WC   insulin aspart  0-5 Units Subcutaneous QHS   insulin glargine-yfgn  5 Units Subcutaneous Daily   ipratropium-albuterol  3 mL Nebulization Q6H WA   lactulose  20 g Oral BID   melatonin  6 mg Oral QHS  Oxcarbazepine  600 mg Oral TID   pantoprazole  40 mg Oral Daily   polyethylene glycol  17 g Oral TID   [START ON 03/29/2023] predniSONE  20 mg Oral Q breakfast   Followed by   [name] ON 04/01/2023] predniSONE   10 mg Oral Q breakfast   senna-docusate  2 tablet Oral QHS   sodium chloride flush  3 mL Intravenous Q12H   sodium chloride flush  3 mL Intravenous Q12H   sodium phosphate  1 enema Rectal Once   tamsulosin  0.8 mg Oral Daily   traZODone  75 mg Oral QHS   Continuous Infusions:  ampicillin-sulbactam (UNASYN) IV 3 g (03/28/23 1315)      LOS: 11 days    Time spent: 35 min    Lacretia Nicks, MD [facility]   To contact the attending provider between 7A-7P or the covering provider during after hours 7P-7A, please log into the web site [email or web address] and access using universal [place] password for that web site. If you do not have the password, please call the hospital operator.  03/28/2023, 5:45 PM

## 2023-03-28 NOTE — Progress Notes (Signed)
   03/28/23 2030  BiPAP/CPAP/SIPAP  BiPAP/CPAP/SIPAP Pt Type Adult  Reason BIPAP/CPAP not in use Non-compliant  BiPAP/CPAP /SiPAP Vitals  Resp 16  MEWS Score/Color  MEWS Score 0  MEWS Score Color [place]

## 2023-03-28 NOTE — Progress Notes (Signed)
   03/27/23 2151  BiPAP/CPAP/SIPAP  BiPAP/CPAP/SIPAP Pt Type Adult  Reason BIPAP/CPAP not in use Non-compliant  BiPAP/CPAP /SiPAP Vitals  Resp (!) 22  SpO2 92 %  Bilateral Breath Sounds Clear;Diminished  MEWS Score/Color  MEWS Score 1  MEWS Score Color Green   BiPAP/CPAP currently not in Room.

## 2023-03-29 DIAGNOSIS — J189 Pneumonia, unspecified organism: Secondary | ICD-10-CM | POA: Diagnosis not present

## 2023-03-29 LAB — GLUCOSE, CAPILLARY
Glucose-Capillary: 158 mg/dL — ABNORMAL HIGH (ref 70–99)
Glucose-Capillary: 135 mg/dL — ABNORMAL HIGH (ref 70–99)
Glucose-Capillary: 175 mg/dL — ABNORMAL HIGH (ref 70–99)
Glucose-Capillary: 133 mg/dL — ABNORMAL HIGH (ref 70–99)

## 2023-03-29 MED ORDER — PSYLLIUM 95 % PO PACK
1.0000 | PACK | Freq: Every day | ORAL | Status: DC
  Administered 2023-03-29 – 2023-04-01 (×4): 1 via ORAL
  Filled 2023-03-29 (×4): qty 1

## 2023-03-29 NOTE — Progress Notes (Signed)
Mobility Specialist - Progress Note   03/29/23 1055  Oxygen Therapy  SpO2 (!) 83 %  O2 Device Nasal Cannula  O2 Flow Rate (L/min) 6 L/min  Patient Activity (if Appropriate) Ambulating  Mobility  Activity Ambulated independently in hallway  Level of Assistance Independent  Assistive Device None  Distance Ambulated (ft) 230 ft  Activity Response Tolerated well  Mobility Referral Yes  Mobility visit 1 Mobility  Mobility Specialist Start Time (ACUTE ONLY) 1043  Mobility Specialist Stop Time (ACUTE ONLY) 1054  Mobility Specialist Time Calculation (min) (ACUTE ONLY) 11 min   Pt received in bed and agreeable to mobility. Pt desat to 83% during ambulation. Encouraged pursed lip breaths bringing SpO2 to 91%. Pt took x2 standing rest breaks d/t SOB. No complaints during session. Pt to EOB after session with all needs met.   Pre-mobility: 95 HR, 92% SpO2 (6L [place]) During mobility: 110 HR, 83-91% SpO2 (6L [place]) Post-mobility: 106 HR, 92% SPO2 (6L [place])  Chief Technology Officer

## 2023-03-29 NOTE — Progress Notes (Signed)
PROGRESS NOTE  Mario [name]:[id number] DOB: 06-17-1950 DOA: 03/17/2023 [facility]: [name]   LOS: 12 days   Brief Narrative / Interim history: Mario [name] is a 73 yo male with PMH COPD, OSA, lung cancer (with radiation tx), chronic hypoxia on home O2 4L, depression/anxiety, hx PE on Eliquis, morbid obesity, HTN, OSA on CPAP who presented with worsening shortness of breath and cough. Found to have RSV infection. Treating for RSV with presumed superimposed bacterial pneumonia as well as COPD exacerbation with steroids.   Subjective / 24h Interval events: Feeling better but still quite weak today.  Was able to ambulate some in the hallway, not feeling back to baseline  Assesement and Plan: Principal Problem:   Community acquired pneumonia Active Problems:   COPD with acute exacerbation (HCC)   Acute on chronic hypoxic respiratory failure (HCC)   Acute hypoxic respiratory failure (HCC)   RSV (respiratory syncytial virus pneumonia)   Hyperkalemia   Essential hypertension   History of lung cancer   History of pulmonary embolus (PE)   RSV bronchitis  Principal problem Acute on chronic hypoxic respiratory failure in the setting of community-acquired pneumonia, RSV infection -CT of the chest on admission showed chronic airspace disease at the bases, progressed since 11/03/2022.  Sputum culture showed moderate normal respiratory flora.  He is being treated for possible community-acquired pneumonia, currently on Unasyn, plan for total of 5 days  Active problems COPD exacerbation, acute on hypoxic respiratory failure-continue steroid taper, nebulizers, currently on home oxygen levels at 45 L which is his baseline  Chest pain-chronic, for years, resolved now.  Cardiac enzymes negative.  Echo shows normal LVEF, no wall motion abnormalities, grade 1 diastolic dysfunction.  Outpatient follow-up  Hyperkalemia-resolved  History of lung cancer-s/p radiation and chemotherapy.   [facility]  Essential hypertension-continue diltiazem  Constipation-continue aggressive bowel regimen  History of PE-continue Eliquis  GAD-continue BuSpar, Cymbalta  DM2-continue sliding scale, A1c 6.7  BPH-continue Flomax  Obesity, morbid-BMI 40, he would benefit from weight loss  Scheduled Meds:  apixaban  5 mg Oral BID   arformoterol  15 mcg Nebulization BID   budesonide (PULMICORT) nebulizer solution  0.25 mg Nebulization BID   buPROPion  300 mg Oral q morning   busPIRone  15 mg Oral Daily   cholecalciferol  1,000 Units Oral Daily   cyclobenzaprine  10 mg Oral QHS   diltiazem  180 mg Oral Daily   docusate sodium  100 mg Oral BID   DULoxetine  60 mg Oral BID   folic acid  1 mg Oral Daily   gabapentin  100 mg Oral TID   guaiFENesin  600 mg Oral BID   insulin aspart  0-20 Units Subcutaneous TID WC   insulin aspart  0-5 Units Subcutaneous QHS   insulin glargine-yfgn  5 Units Subcutaneous Daily   ipratropium-albuterol  3 mL Nebulization BID   lactulose  20 g Oral BID   melatonin  6 mg Oral QHS   Oxcarbazepine  600 mg Oral TID   pantoprazole  40 mg Oral Daily   polyethylene glycol  17 g Oral TID   predniSONE  20 mg Oral Q breakfast   Followed by   [name] ON 04/01/2023] predniSONE  10 mg Oral Q breakfast   psyllium  1 packet Oral Daily   senna-docusate  2 tablet Oral QHS   sodium chloride flush  3 mL Intravenous Q12H   sodium chloride flush  3 mL Intravenous Q12H  tamsulosin  0.8 mg Oral Daily   traZODone  75 mg Oral QHS   Continuous Infusions:  ampicillin-sulbactam (UNASYN) IV 3 g (03/29/23 0553)   PRN Meds:.acetaminophen **OR** acetaminophen, albuterol, labetalol, ondansetron **OR** ondansetron (ZOFRAN) IV, mouth rinse, polyvinyl alcohol, sodium chloride, sodium chloride flush  Current Outpatient Medications  Medication Instructions   acetaminophen (TYLENOL) 1,000 mg, Oral, Every 6 hours PRN   albuterol (PROVENTIL) (2.5 MG/3ML) 0.083%  nebulizer solution Inhale 3 mLs (2.5 mg total) by nebulization every 4 (four) hours as needed for wheezing or shortness of breath.   albuterol (VENTOLIN HFA) 108 (90 Base) MCG/ACT inhaler 2 puffs, Inhalation, Every 4 hours PRN   apixaban (ELIQUIS) 5 mg, Oral, 2 times daily   ascorbic acid (VITAMIN C) 500 mg, Daily   aspirin EC 81 mg, Oral, Daily, Swallow whole.   azelastine (ASTELIN) 0.1 % nasal spray 1 spray, Each Nare, 2 times daily, Use in each nostril as directed   bisacodyl (DULCOLAX) 10 mg, Oral, Daily PRN   BLACK CURRANT SEED OIL PO 2 each, Oral, Daily   buPROPion (WELLBUTRIN XL) 300 mg, Every morning   busPIRone (BUSPAR) 15 mg, Every morning   Calcium Carbonate (CALCIUM 500 PO) 1 tablet, Oral, Daily   Carboxymethylcellulose Sod PF 0.5 % SOLN 1 drop, 4 times daily PRN   cholecalciferol (VITAMIN D3) 1,000 Units, Daily   clobetasol ointment (TEMOVATE) 0.05 % 1 Application, 2 times daily PRN   cyclobenzaprine (FLEXERIL) 10 mg, Daily at bedtime   cycloSPORINE (RESTASIS) 0.05 % ophthalmic emulsion 1 drop, Every 12 hours   diltiazem (CARDIZEM CD) 180 mg, Every morning   docusate sodium (COLACE) 200 mg, Oral, 2 times daily   DULoxetine (CYMBALTA) 60 mg, 2 times daily   fluticasone (FLONASE) 50 MCG/ACT nasal spray 2 sprays, Daily   folic acid (FOLVITE) 1 mg, Daily   gabapentin (NEURONTIN) 100 mg, 3 times daily   GARLIC PO 1 tablet, Daily   KRILL OIL PO 2 capsules, Daily   LACTULOSE PO 2 Scoops, Oral, 2 times daily PRN, 2 tablespoonfuls   lidocaine (XYLOCAINE) 5 % ointment 1 Application, Apply externally, 2 times daily PRN   MAGNESIUM OXIDE PO 1 tablet, Oral, Daily at bedtime   Melatonin 3 mg, Daily at bedtime   metFORMIN (GLUCOPHAGE) 850 mg, Every evening   mometasone (ASMANEX) 220 MCG/ACT inhaler 2 puffs, Daily PRN   Multiple Vitamin (MULTIVITAMIN ADULT PO) 1 tablet, Oral, Daily   Nutritional Supplements (ENSURE ORIGINAL) LIQD 1 Bottle, Daily PRN   omeprazole (PRILOSEC) 20 mg, Oral,  Daily   Oxcarbazepine (TRILEPTAL) 600 mg, 3 times daily   polyethylene glycol powder (GLYCOLAX/MIRALAX) 17 g, Daily   tamsulosin (FLOMAX) 0.8 mg, Daily   Tiotropium Bromide-Olodaterol 2.5-2.5 MCG/ACT AERS 2 each, Every morning   traZODone (DESYREL) 75 mg, Daily at bedtime   vitamin B-12 (CYANOCOBALAMIN) 100 mcg, Daily    Diet Orders (From admission, onward)     Start     Ordered   03/17/23 0421  Diet heart healthy/carb modified Room service appropriate? Yes; Fluid consistency: Thin  Diet effective now       Question Answer Comment  Diet-HS Snack? Nothing   Room service appropriate? Yes   Fluid consistency: Thin      03/17/23 0420            DVT prophylaxis: SCDs Start: 03/17/23 0421 Place TED hose Start: 03/17/23 0421 apixaban (ELIQUIS) tablet 5 mg   Lab Results  Component Value Date   PLT 273 03/28/2023  Code Status: Full Code  Family Communication: No family at bedside  Status is: Inpatient Remains inpatient appropriate because: severity of illness  Level of care: Progressive  Consultants:  none  Objective: Vitals:   03/29/23 0905 03/29/23 0907 03/29/23 0909 03/29/23 1055  BP:      Pulse:      Resp:      Temp:      TempSrc:      SpO2: 99% 99% 99% (!) 83%  Weight:      Height:        Intake/Output Summary (Last 24 hours) at 03/29/2023 1215 Last data filed at 03/29/2023 0800 Gross per 24 hour  Intake 480 ml  Output 2175 ml  Net -1695 ml   Wt Readings from Last 3 Encounters:  03/17/23 (!) 141.3 kg  01/26/23 127 kg  10/29/22 136.1 kg    Examination:  Constitutional: NAD Eyes: no scleral icterus ENMT: Mucous membranes are moist.  Neck: normal, supple Respiratory: clear to auscultation bilaterally, no wheezing, no crackles.  Cardiovascular: Regular rate and rhythm, no murmurs / rubs / gallops.  Abdomen: non distended, no tenderness. Bowel sounds positive.  Musculoskeletal: no clubbing / cyanosis.   Data Reviewed: I have independently  reviewed following labs and imaging studies   CBC Recent Labs  Lab 03/23/23 0455 03/24/23 0455 03/25/23 0551 03/27/23 1046 03/28/23 0532  WBC 13.9* 13.1* 12.4* 15.3* 13.6*  HGB 14.8 13.8 13.4 14.0 13.2  HCT 45.0 41.6 41.0 42.4 40.2  PLT 292 297 283 302 273  MCV 92.8 92.7 94.3 93.0 93.1  MCH 30.5 30.7 30.8 30.7 30.6  MCHC 32.9 33.2 32.7 33.0 32.8  RDW 13.2 13.1 13.3 13.6 13.5  LYMPHSABS 3.5 2.3 3.1  --  3.0  MONOABS 1.2* 1.3* 1.2*  --  1.3*  EOSABS 1.1* 0.1 0.4  --  0.3  BASOSABS 0.1 0.1 0.1  --  0.1    Recent Labs  Lab 03/23/23 0455 03/23/23 1638 03/24/23 0455 03/25/23 0551 03/27/23 1046 03/28/23 0532  NA 134*  --  133* 136 135 133*  K 3.8  --  4.0 4.3 4.7 4.4  CL 101  --  99 98 94* 96*  CO2 25  --  25 31 32 29  GLUCOSE 156*  --  206* 162* 137* 167*  BUN 28*  --  23 21 17 23   CREATININE 0.84  --  0.85 0.94 0.88 1.05  CALCIUM 8.5*  --  8.1* 8.4* 9.1 8.4*  AST 17  --  15 13*  --  15  ALT 33  --  27 7  --  22  ALKPHOS 64  --  59 60  --  62  BILITOT 0.4  --  0.3 0.5  --  0.5  ALBUMIN 3.2*  --  2.9* 3.3*  --  3.2*  MG 2.0  --  2.1 2.2 2.3 2.3  BNP  --  32.3  --   --  36.3  --     ------------------------------------------------------------------------------------------------------------------ No results for input(s): "CHOL", "HDL", "LDLCALC", "TRIG", "CHOLHDL", "LDLDIRECT" in the last 72 hours.  Lab Results  Component Value Date   HGBA1C 6.7 (H) 03/18/2023   ------------------------------------------------------------------------------------------------------------------ No results for input(s): "TSH", "T4TOTAL", "T3FREE", "THYROIDAB" in the last 72 hours.  Invalid input(s): "FREET3"  Cardiac Enzymes No results for input(s): "CKMB", "TROPONINI", "MYOGLOBIN" in the last 168 hours.  Invalid input(s): "CK" ------------------------------------------------------------------------------------------------------------------    Component Value Date/Time   BNP  36.3 03/27/2023 1046    CBG:  Recent Labs  Lab 03/28/23 1138 03/28/23 1555 03/28/23 1957 03/29/23 0730 03/29/23 1121  GLUCAP 175* 159* 167* 158* 135*    Recent Results (from the past 240 hours)  Expectorated Sputum Assessment w Gram Stain, Rflx to Resp Cult     Status: None   Collection Time: 03/24/23  4:56 PM   Specimen: Expectorated Sputum  Result Value Ref Range Status   Specimen Description EXPECTORATED SPUTUM  Final   Special Requests NONE  Final   Sputum evaluation   Final    THIS SPECIMEN IS ACCEPTABLE FOR SPUTUM CULTURE Performed at [facility], [address] [place], [place] [address]    Report Status 03/24/2023 FINAL  Final  Culture, Respiratory w Gram Stain     Status: None   Collection Time: 03/24/23  4:56 PM  Result Value Ref Range Status   Specimen Description   Final    EXPECTORATED SPUTUM Performed at [facility], [address] [place], [place] [address]    Special Requests   Final    NONE Reflexed from [id number] Performed at [facility], [address], [place], [place] [address]    Gram Stain   Final    FEW WBC PRESENT, PREDOMINANTLY PMN RARE GRAM POSITIVE COCCI RARE BUDDING YEAST SEEN    Culture   Final    MODERATE Normal respiratory flora-no Staph aureus or Pseudomonas seen Performed at [facility], [address] [place], [place] [address]    Report Status 03/27/2023 FINAL  Final     Radiology Studies: No results found.   Pamella Pert, MD, PhD [facility]  Between 7 am - 7 pm I am available, please contact me via Amion (for emergencies) or Securechat (non urgent messages)  Between 7 pm - 7 am I am not available, please contact night coverage MD/APP via Amion

## 2023-03-30 DIAGNOSIS — J189 Pneumonia, unspecified organism: Secondary | ICD-10-CM | POA: Diagnosis not present

## 2023-03-30 LAB — CBC
HCT: 39.5 % (ref 39.0–52.0)
Hemoglobin: 13.1 g/dL (ref 13.0–17.0)
MCH: 30.8 pg (ref 26.0–34.0)
MCHC: 33.2 g/dL (ref 30.0–36.0)
MCV: 92.7 fL (ref 80.0–100.0)
Platelets: 251 10*3/uL (ref 150–400)
RBC: 4.26 MIL/uL (ref 4.22–5.81)
RDW: 13.6 % (ref 11.5–15.5)
WBC: 12.6 10*3/uL — ABNORMAL HIGH (ref 4.0–10.5)
nRBC: 0 % (ref 0.0–0.2)

## 2023-03-30 LAB — COMPREHENSIVE METABOLIC PANEL
ALT: 19 U/L (ref 0–44)
AST: 15 U/L (ref 15–41)
Albumin: 3.2 g/dL — ABNORMAL LOW (ref 3.5–5.0)
Alkaline Phosphatase: 58 U/L (ref 38–126)
Anion gap: 8 (ref 5–15)
BUN: 16 mg/dL (ref 8–23)
CO2: 26 mmol/L (ref 22–32)
Calcium: 8.2 mg/dL — ABNORMAL LOW (ref 8.9–10.3)
Chloride: 98 mmol/L (ref 98–111)
Creatinine, Ser: 0.75 mg/dL (ref 0.61–1.24)
GFR, Estimated: >60 mL/min (ref >60)
Glucose, Bld: 157 mg/dL — ABNORMAL HIGH (ref 70–99)
Potassium: 3.9 mmol/L (ref 3.5–5.1)
Sodium: 132 mmol/L — ABNORMAL LOW (ref 135–145)
Total Bilirubin: 0.5 mg/dL (ref 0.0–1.2)
Total Protein: 6.2 g/dL — ABNORMAL LOW (ref 6.5–8.1)

## 2023-03-30 LAB — GLUCOSE, CAPILLARY
Glucose-Capillary: 103 mg/dL — ABNORMAL HIGH (ref 70–99)
Glucose-Capillary: 113 mg/dL — ABNORMAL HIGH (ref 70–99)
Glucose-Capillary: 284 mg/dL — ABNORMAL HIGH (ref 70–99)
Glucose-Capillary: 110 mg/dL — ABNORMAL HIGH (ref 70–99)

## 2023-03-30 LAB — MAGNESIUM: Magnesium: 2.3 mg/dL (ref 1.7–2.4)

## 2023-03-30 MED ORDER — FUROSEMIDE 10 MG/ML IJ SOLN
40.0000 mg | Freq: Once | INTRAMUSCULAR | Status: AC
  Administered 2023-03-30: 40 mg via INTRAVENOUS
  Filled 2023-03-30: qty 4

## 2023-03-30 NOTE — Progress Notes (Signed)
Mobility Specialist - Progress Note   03/30/23 1035  Oxygen Therapy  SpO2 91 %  O2 Device Nasal Cannula  O2 Flow Rate (L/min) 6 L/min  Patient Activity (if Appropriate) Ambulating  Mobility  Activity Ambulated independently in hallway  Level of Assistance Independent  Assistive Device None  Distance Ambulated (ft) 230 ft  Activity Response Tolerated well  Mobility Referral Yes  Mobility visit 1 Mobility  Mobility Specialist Start Time (ACUTE ONLY) 1022  Mobility Specialist Stop Time (ACUTE ONLY) 1034  Mobility Specialist Time Calculation (min) (ACUTE ONLY) 12 min   Pt received in bed and agreeable to mobility. Pt took x1 standing rest break d/t SOB. No complaints during session. Pt to EOB after session with all needs met.    Pre-mobility: 94% SpO2 (6L [place]) During mobility: 104 HR, 91% SpO2 (6L [place]) Post-mobility: 95 HR, 94%SPO2 (6L [place])  Chief Technology Officer

## 2023-03-30 NOTE — Progress Notes (Signed)
PROGRESS NOTE  [name] RUE:[id number] DOB: [birth year] DOA: 03/17/2023 PCP: [name]   LOS: 13 days   Brief Narrative / Interim history: [name] is a 73 yo male with PMH COPD, OSA, lung cancer (with radiation tx), chronic hypoxia on home O2 4L, depression/anxiety, hx PE on Eliquis, morbid obesity, HTN, OSA on CPAP who presented with worsening shortness of breath and cough. Found to have RSV infection. Treating for RSV with presumed superimposed bacterial pneumonia as well as COPD exacerbation with steroids.   Subjective / 24h Interval events: Complains of a persistent cough, has noted some pink-tinged sputum.  Also complains of lower feet tingling  Assesement and Plan: Principal Problem:   Community acquired pneumonia Active Problems:   COPD with acute exacerbation (HCC)   Acute on chronic hypoxic respiratory failure (HCC)   Acute hypoxic respiratory failure (HCC)   RSV (respiratory syncytial virus pneumonia)   Hyperkalemia   Essential hypertension   History of lung cancer   History of pulmonary embolus (PE)   RSV bronchitis  Principal problem Acute on chronic hypoxic respiratory failure in the setting of community-acquired pneumonia, RSV infection -CT of the chest on admission showed chronic airspace disease at the bases, progressed since 11/03/2022.  Sputum culture showed moderate normal respiratory flora.  He is being treated for possible community-acquired pneumonia, currently on Unasyn, plan for total of 5 days, today's last day.  Off and on has been on antibiotics since 1/17 except for 2 days  Active problems COPD exacerbation, acute on hypoxic respiratory failure-continue steroid taper, nebulizers, currently on home oxygen levels at 4-5 L which is his baseline -Incentive spirometry, encourage ambulation  Chest pain-chronic, for years, resolved now.  Cardiac enzymes negative.  Echo shows normal LVEF, no wall motion abnormalities, grade 1 diastolic  dysfunction.  Outpatient follow-up  Hyperkalemia-resolved  History of lung cancer-s/p radiation and chemotherapy.  [facility]  Essential hypertension-continue diltiazem  Constipation-continue aggressive bowel regimen  History of PE-continue Eliquis  GAD-continue BuSpar, Cymbalta  DM2-continue sliding scale, A1c 6.7  BPH-continue Flomax  Obesity, morbid-BMI 40, he would benefit from weight loss  Scheduled Meds:  apixaban  5 mg Oral BID   arformoterol  15 mcg Nebulization BID   budesonide (PULMICORT) nebulizer solution  0.25 mg Nebulization BID   buPROPion  300 mg Oral q morning   busPIRone  15 mg Oral Daily   cholecalciferol  1,000 Units Oral Daily   cyclobenzaprine  10 mg Oral QHS   diltiazem  180 mg Oral Daily   docusate sodium  100 mg Oral BID   DULoxetine  60 mg Oral BID   folic acid  1 mg Oral Daily   gabapentin  100 mg Oral TID   guaiFENesin  600 mg Oral BID   insulin aspart  0-20 Units Subcutaneous TID WC   insulin aspart  0-5 Units Subcutaneous QHS   insulin glargine-yfgn  5 Units Subcutaneous Daily   ipratropium-albuterol  3 mL Nebulization BID   lactulose  20 g Oral BID   melatonin  6 mg Oral QHS   Oxcarbazepine  600 mg Oral TID   pantoprazole  40 mg Oral Daily   polyethylene glycol  17 g Oral TID   predniSONE  20 mg Oral Q breakfast   Followed by   [name] ON 04/01/2023] predniSONE  10 mg Oral Q breakfast   psyllium  1 packet Oral Daily   senna-docusate  2 tablet Oral QHS   sodium chloride flush  3 mL Intravenous Q12H   sodium chloride flush  3 mL Intravenous Q12H   tamsulosin  0.8 mg Oral Daily   traZODone  75 mg Oral QHS   Continuous Infusions:  ampicillin-sulbactam (UNASYN) IV 3 g (03/30/23 0526)   PRN Meds:.acetaminophen **OR** acetaminophen, albuterol, labetalol, ondansetron **OR** ondansetron (ZOFRAN) IV, mouth rinse, polyvinyl alcohol, sodium chloride, sodium chloride flush  Current Outpatient Medications  Medication  Instructions   acetaminophen (TYLENOL) 1,000 mg, Oral, Every 6 hours PRN   albuterol (PROVENTIL) (2.5 MG/3ML) 0.083% nebulizer solution Inhale 3 mLs (2.5 mg total) by nebulization every 4 (four) hours as needed for wheezing or shortness of breath.   albuterol (VENTOLIN HFA) 108 (90 Base) MCG/ACT inhaler 2 puffs, Inhalation, Every 4 hours PRN   apixaban (ELIQUIS) 5 mg, Oral, 2 times daily   ascorbic acid (VITAMIN C) 500 mg, Daily   aspirin EC 81 mg, Oral, Daily, Swallow whole.   azelastine (ASTELIN) 0.1 % nasal spray 1 spray, Each Nare, 2 times daily, Use in each nostril as directed   bisacodyl (DULCOLAX) 10 mg, Oral, Daily PRN   BLACK CURRANT SEED OIL PO 2 each, Oral, Daily   buPROPion (WELLBUTRIN XL) 300 mg, Every morning   busPIRone (BUSPAR) 15 mg, Every morning   Calcium Carbonate (CALCIUM 500 PO) 1 tablet, Oral, Daily   Carboxymethylcellulose Sod PF 0.5 % SOLN 1 drop, 4 times daily PRN   cholecalciferol (VITAMIN D3) 1,000 Units, Daily   clobetasol ointment (TEMOVATE) 0.05 % 1 Application, 2 times daily PRN   cyclobenzaprine (FLEXERIL) 10 mg, Daily at bedtime   cycloSPORINE (RESTASIS) 0.05 % ophthalmic emulsion 1 drop, Every 12 hours   diltiazem (CARDIZEM CD) 180 mg, Every morning   docusate sodium (COLACE) 200 mg, Oral, 2 times daily   DULoxetine (CYMBALTA) 60 mg, 2 times daily   fluticasone (FLONASE) 50 MCG/ACT nasal spray 2 sprays, Daily   folic acid (FOLVITE) 1 mg, Daily   gabapentin (NEURONTIN) 100 mg, 3 times daily   GARLIC PO 1 tablet, Daily   KRILL OIL PO 2 capsules, Daily   LACTULOSE PO 2 Sco[place], Oral, 2 times daily PRN, 2 tablespoonfuls   lidocaine (XYLOCAINE) 5 % ointment 1 Application, Apply externally, 2 times daily PRN   MAGNESIUM OXIDE PO 1 tablet, Oral, Daily at bedtime   Melatonin 3 mg, Daily at bedtime   metFORMIN (GLUCOPHAGE) 850 mg, Every evening   mometasone (ASMANEX) 220 MCG/ACT inhaler 2 puffs, Daily PRN   Multiple Vitamin (MULTIVITAMIN ADULT PO) 1 tablet,  Oral, Daily   Nutritional Supplements (ENSURE ORIGINAL) LIQD 1 Bottle, Daily PRN   omeprazole (PRILOSEC) 20 mg, Oral, Daily   Oxcarbazepine (TRILEPTAL) 600 mg, 3 times daily   polyethylene glycol powder (GLYCOLAX/MIRALAX) 17 g, Daily   tamsulosin (FLOMAX) 0.8 mg, Daily   Tiotropium Bromide-Olodaterol 2.5-2.5 MCG/ACT AERS 2 each, Every morning   traZODone (DESYREL) 75 mg, Daily at bedtime   vitamin B-12 (CYANOCOBALAMIN) 100 mcg, Daily    Diet Orders (From admission, onward)     Start     Ordered   03/17/23 0421  Diet heart healthy/carb modified Room service appropriate? Yes; Fluid consistency: Thin  Diet effective now       Question Answer Comment  Diet-HS Snack? Nothing   Room service appropriate? Yes   Fluid consistency: Thin      03/17/23 0420            DVT prophylaxis: SCDs Start: 03/17/23 0421 Place TED hose Start: 03/17/23 0421 apixaban (ELIQUIS)  tablet 5 mg   Lab Results  Component Value Date   PLT 251 03/30/2023      Code Status: Full Code  Family Communication: No family at bedside  Status is: Inpatient Remains inpatient appropriate because: severity of illness  Level of care: Progressive  Consultants:  none  Objective: Vitals:   03/29/23 2120 03/30/23 0433 03/30/23 0739 03/30/23 1035  BP:  (!) 143/88    Pulse:  82    Resp:  16    Temp:  97.9 F (36.6 C)    TempSrc:  Oral    SpO2: 97% 96% 97% 91%  Weight:      Height:        Intake/Output Summary (Last 24 hours) at 03/30/2023 1154 Last data filed at 03/30/2023 0800 Gross per 24 hour  Intake 723 ml  Output 1450 ml  Net -727 ml   Wt Readings from Last 3 Encounters:  03/17/23 (!) 141.3 kg  01/26/23 127 kg  10/29/22 136.1 kg    Examination: Constitutional: NAD Eyes: lids and conjunctivae normal, no scleral icterus ENMT: mmm Neck: normal, supple Respiratory: End expiratory wheezing present, no crackles Cardiovascular: Regular rate and rhythm, no murmurs / rubs / gallops. No LE  edema. Abdomen: soft, no distention, no tenderness. Bowel sounds positive.   Data Reviewed: I have independently reviewed following labs and imaging studies   CBC Recent Labs  Lab 03/24/23 0455 03/25/23 0551 03/27/23 1046 03/28/23 0532 03/30/23 0525  WBC 13.1* 12.4* 15.3* 13.6* 12.6*  HGB 13.8 13.4 14.0 13.2 13.1  HCT 41.6 41.0 42.4 40.2 39.5  PLT 297 283 302 273 251  MCV 92.7 94.3 93.0 93.1 92.7  MCH 30.7 30.8 30.7 30.6 30.8  MCHC 33.2 32.7 33.0 32.8 33.2  RDW 13.1 13.3 13.6 13.5 13.6  LYMPHSABS 2.3 3.1  --  3.0  --   MONOABS 1.3* 1.2*  --  1.3*  --   EOSABS 0.1 0.4  --  0.3  --   BASOSABS 0.1 0.1  --  0.1  --     Recent Labs  Lab 03/23/23 1638 03/24/23 0455 03/25/23 0551 03/27/23 1046 03/28/23 0532 03/30/23 0525  NA  --  133* 136 135 133* 132*  K  --  4.0 4.3 4.7 4.4 3.9  CL  --  99 98 94* 96* 98  CO2  --  25 31 32 29 26  GLUCOSE  --  206* 162* 137* 167* 157*  BUN  --  23 21 17 23 16   CREATININE  --  0.85 0.94 0.88 1.05 0.75  CALCIUM  --  8.1* 8.4* 9.1 8.4* 8.2*  AST  --  15 13*  --  15 15  ALT  --  27 7  --  22 19  ALKPHOS  --  59 60  --  62 58  BILITOT  --  0.3 0.5  --  0.5 0.5  ALBUMIN  --  2.9* 3.3*  --  3.2* 3.2*  MG  --  2.1 2.2 2.3 2.3 2.3  BNP 32.3  --   --  36.3  --   --     ------------------------------------------------------------------------------------------------------------------ No results for input(s): "CHOL", "HDL", "LDLCALC", "TRIG", "CHOLHDL", "LDLDIRECT" in the last 72 hours.  Lab Results  Component Value Date   HGBA1C 6.7 (H) 03/18/2023   ------------------------------------------------------------------------------------------------------------------ No results for input(s): "TSH", "T4TOTAL", "T3FREE", "THYROIDAB" in the last 72 hours.  Invalid input(s): "FREET3"  Cardiac Enzymes No results for input(s): "CKMB", "TROPONINI", "MYOGLOBIN" in the  last 168 hours.  Invalid input(s):  "CK" ------------------------------------------------------------------------------------------------------------------    Component Value Date/Time   BNP 36.3 03/27/2023 1046    CBG: Recent Labs  Lab 03/29/23 1121 03/29/23 1614 03/29/23 2000 03/30/23 0724 03/30/23 1112  GLUCAP 135* 175* 133* 103* 113*    Recent Results (from the past 240 hours)  Expectorated Sputum Assessment w Gram Stain, Rflx to Resp Cult     Status: None   Collection Time: 03/24/23  4:56 PM   Specimen: Expectorated Sputum  Result Value Ref Range Status   Specimen Description EXPECTORATED SPUTUM  Final   Special Requests NONE  Final   Sputum evaluation   Final    THIS SPECIMEN IS ACCEPTABLE FOR SPUTUM CULTURE Performed at [facility], [address], [place], [place] [address]    Report Status 03/24/2023 FINAL  Final  Culture, Respiratory w Gram Stain     Status: None   Collection Time: 03/24/23  4:56 PM  Result Value Ref Range Status   Specimen Description   Final    EXPECTORATED SPUTUM Performed at [facility], [address] [place], [place] [address]    Special Requests   Final    NONE Reflexed from [id number] Performed at [facility], [address] [place], [place] [address]    Gram Stain   Final    FEW WBC PRESENT, PREDOMINANTLY PMN RARE GRAM POSITIVE COCCI RARE BUDDING YEAST SEEN    Culture   Final    MODERATE Normal respiratory flora-no Staph aureus or Pseudomonas seen Performed at [facility], [address], [place], [place] [address]    Report Status 03/27/2023 FINAL  Final     Radiology Studies: No results found.   Pamella Pert, MD, PhD [facility]  Between 7 am - 7 pm I am available, please contact me via Amion (for emergencies) or Securechat (non urgent messages)  Between 7 pm - 7 am I am not available, please contact night coverage MD/APP via Amion

## 2023-03-30 NOTE — Progress Notes (Signed)
   03/30/23 1937  BiPAP/CPAP/SIPAP  BiPAP/CPAP/SIPAP Pt Type Adult  Reason BIPAP/CPAP not in use Non-compliant  BiPAP/CPAP /SiPAP Vitals  SpO2 93 %  Bilateral Breath Sounds Diminished

## 2023-03-31 DIAGNOSIS — J189 Pneumonia, unspecified organism: Secondary | ICD-10-CM | POA: Diagnosis not present

## 2023-03-31 LAB — BASIC METABOLIC PANEL
Anion gap: 9 (ref 5–15)
BUN: 18 mg/dL (ref 8–23)
CO2: 26 mmol/L (ref 22–32)
Calcium: 8.5 mg/dL — ABNORMAL LOW (ref 8.9–10.3)
Chloride: 94 mmol/L — ABNORMAL LOW (ref 98–111)
Creatinine, Ser: 0.92 mg/dL (ref 0.61–1.24)
GFR, Estimated: >60 mL/min (ref >60)
Glucose, Bld: 138 mg/dL — ABNORMAL HIGH (ref 70–99)
Potassium: 3.6 mmol/L (ref 3.5–5.1)
Sodium: 129 mmol/L — ABNORMAL LOW (ref 135–145)

## 2023-03-31 LAB — GLUCOSE, CAPILLARY
Glucose-Capillary: 120 mg/dL — ABNORMAL HIGH (ref 70–99)
Glucose-Capillary: 147 mg/dL — ABNORMAL HIGH (ref 70–99)
Glucose-Capillary: 178 mg/dL — ABNORMAL HIGH (ref 70–99)
Glucose-Capillary: 167 mg/dL — ABNORMAL HIGH (ref 70–99)

## 2023-03-31 LAB — MAGNESIUM: Magnesium: 2.4 mg/dL (ref 1.7–2.4)

## 2023-03-31 LAB — PHOSPHORUS: Phosphorus: 3.7 mg/dL (ref 2.5–4.6)

## 2023-03-31 NOTE — Progress Notes (Signed)
Mobility Specialist - Progress Note  Pre-mobility: 104 bpm HR, 93% SpO2 (5L [place]) During mobility: 120 bpm HR, 88% SpO2 (6L [place]) Post-mobility: 105 bpm HR, 94% SPO2 (5L [place])   03/31/23 1440  Mobility  Activity Ambulated independently in hallway  Level of Assistance Independent  Assistive Device None  Distance Ambulated (ft) 200 ft  Range of Motion/Exercises Active  Activity Response Tolerated fair  Mobility Referral Yes  Mobility visit 1 Mobility  Mobility Specialist Start Time (ACUTE ONLY) 1420  Mobility Specialist Stop Time (ACUTE ONLY) 1435  Mobility Specialist Time Calculation (min) (ACUTE ONLY) 15 min   Pt was found in bed and agreeable to ambulate. Stated feeling SOB and had x1 brief standing rest break. SPO2 was 88% and able to increase >90% within 1 min. At EOS returned to sit EOB and SPO2 reading was 86% and able to increase to 89% within 10 seconds. Was left sitting EOB with all needs met. Call bell in reach and RN notified of session.  Billey Chang Mobility Specialist

## 2023-03-31 NOTE — Progress Notes (Signed)
PROGRESS NOTE  Mario [name] ZOX:[id number] DOB: [birth year] DOA: 03/17/2023 PCP: [name]   LOS: 14 days   Brief Narrative / Interim history: Mario [name] is a 73 yo male with PMH COPD, OSA, lung cancer (with radiation tx), chronic hypoxia on home O2 4L, depression/anxiety, hx PE on Eliquis, morbid obesity, HTN, OSA on CPAP who presented with worsening shortness of breath and cough. Found to have RSV infection. Treating for RSV with presumed superimposed bacterial pneumonia as well as COPD exacerbation with steroids.   Subjective / 24h Interval events: Finally started to feel like he is improving.  Was able to ambulate a little bit more yesterday  Assesement and Plan: Principal Problem:   Community acquired pneumonia Active Problems:   COPD with acute exacerbation (HCC)   Acute on chronic hypoxic respiratory failure (HCC)   Acute hypoxic respiratory failure (HCC)   RSV (respiratory syncytial virus pneumonia)   Hyperkalemia   Essential hypertension   History of lung cancer   History of pulmonary embolus (PE)   RSV bronchitis  Principal problem Acute on chronic hypoxic respiratory failure in the setting of community-acquired pneumonia, RSV infection -CT of the chest on admission showed chronic airspace disease at the bases, progressed since 11/03/2022.  Sputum culture showed moderate normal respiratory flora.  Has completed a course of antibiotics as well as steroids.  Gradually improving.  Anticipate home tomorrow if he has a good day today -Lasix x 1 yesterday, will not repeat today  Active problems COPD exacerbation, acute on hypoxic respiratory failure-continue steroid taper, nebulizers, currently on home oxygen levels at 4-5 L which is his baseline -Incentive spirometry, continue to ambulation  Chest pain-chronic, for years, resolved now.  Cardiac enzymes negative.  Echo shows normal LVEF, no wall motion abnormalities, grade 1 diastolic dysfunction.  Outpatient  follow-up  Hyponatremia-due to Lasix  Hyperkalemia-resolved  History of lung cancer-s/p radiation and chemotherapy.  [facility]  Essential hypertension-continue diltiazem  Constipation-continue aggressive bowel regimen  History of PE-continue Eliquis  GAD-continue BuSpar, Cymbalta  DM2-continue sliding scale, A1c 6.7  BPH-continue Flomax  Obesity, morbid-BMI 40, he would benefit from weight loss  Scheduled Meds:  apixaban  5 mg Oral BID   arformoterol  15 mcg Nebulization BID   budesonide (PULMICORT) nebulizer solution  0.25 mg Nebulization BID   buPROPion  300 mg Oral q morning   busPIRone  15 mg Oral Daily   cholecalciferol  1,000 Units Oral Daily   cyclobenzaprine  10 mg Oral QHS   diltiazem  180 mg Oral Daily   docusate sodium  100 mg Oral BID   DULoxetine  60 mg Oral BID   folic acid  1 mg Oral Daily   gabapentin  100 mg Oral TID   guaiFENesin  600 mg Oral BID   insulin aspart  0-20 Units Subcutaneous TID WC   insulin aspart  0-5 Units Subcutaneous QHS   insulin glargine-yfgn  5 Units Subcutaneous Daily   ipratropium-albuterol  3 mL Nebulization BID   lactulose  20 g Oral BID   melatonin  6 mg Oral QHS   Oxcarbazepine  600 mg Oral TID   pantoprazole  40 mg Oral Daily   polyethylene glycol  17 g Oral TID   [START ON 04/01/2023] predniSONE  10 mg Oral Q breakfast   psyllium  1 packet Oral Daily   senna-docusate  2 tablet Oral QHS   sodium chloride flush  3 mL Intravenous Q12H   sodium chloride  flush  3 mL Intravenous Q12H   tamsulosin  0.8 mg Oral Daily   traZODone  75 mg Oral QHS   Continuous Infusions:   PRN Meds:.acetaminophen **OR** acetaminophen, albuterol, labetalol, ondansetron **OR** ondansetron (ZOFRAN) IV, mouth rinse, polyvinyl alcohol, sodium chloride, sodium chloride flush  Current Outpatient Medications  Medication Instructions   acetaminophen (TYLENOL) 1,000 mg, Oral, Every 6 hours PRN   albuterol (PROVENTIL) (2.5 MG/3ML)  0.083% nebulizer solution Inhale 3 mLs (2.5 mg total) by nebulization every 4 (four) hours as needed for wheezing or shortness of breath.   albuterol (VENTOLIN HFA) 108 (90 Base) MCG/ACT inhaler 2 puffs, Inhalation, Every 4 hours PRN   apixaban (ELIQUIS) 5 mg, Oral, 2 times daily   ascorbic acid (VITAMIN C) 500 mg, Daily   aspirin EC 81 mg, Oral, Daily, Swallow whole.   azelastine (ASTELIN) 0.1 % nasal spray 1 spray, Each Nare, 2 times daily, Use in each nostril as directed   bisacodyl (DULCOLAX) 10 mg, Oral, Daily PRN   BLACK CURRANT SEED OIL PO 2 each, Oral, Daily   buPROPion (WELLBUTRIN XL) 300 mg, Every morning   busPIRone (BUSPAR) 15 mg, Every morning   Calcium Carbonate (CALCIUM 500 PO) 1 tablet, Oral, Daily   Carboxymethylcellulose Sod PF 0.5 % SOLN 1 drop, 4 times daily PRN   cholecalciferol (VITAMIN D3) 1,000 Units, Daily   clobetasol ointment (TEMOVATE) 0.05 % 1 Application, 2 times daily PRN   cyclobenzaprine (FLEXERIL) 10 mg, Daily at bedtime   cycloSPORINE (RESTASIS) 0.05 % ophthalmic emulsion 1 drop, Every 12 hours   diltiazem (CARDIZEM CD) 180 mg, Every morning   docusate sodium (COLACE) 200 mg, Oral, 2 times daily   DULoxetine (CYMBALTA) 60 mg, 2 times daily   fluticasone (FLONASE) 50 MCG/ACT nasal spray 2 sprays, Daily   folic acid (FOLVITE) 1 mg, Daily   gabapentin (NEURONTIN) 100 mg, 3 times daily   GARLIC PO 1 tablet, Daily   KRILL OIL PO 2 capsules, Daily   LACTULOSE PO 2 Scoops, Oral, 2 times daily PRN, 2 tablespoonfuls   lidocaine (XYLOCAINE) 5 % ointment 1 Application, Apply externally, 2 times daily PRN   MAGNESIUM OXIDE PO 1 tablet, Oral, Daily at bedtime   Melatonin 3 mg, Daily at bedtime   metFORMIN (GLUCOPHAGE) 850 mg, Every evening   mometasone (ASMANEX) 220 MCG/ACT inhaler 2 puffs, Daily PRN   Multiple Vitamin (MULTIVITAMIN ADULT PO) 1 tablet, Oral, Daily   Nutritional Supplements (ENSURE ORIGINAL) LIQD 1 Bottle, Daily PRN   omeprazole (PRILOSEC) 20 mg,  Oral, Daily   Oxcarbazepine (TRILEPTAL) 600 mg, 3 times daily   polyethylene glycol powder (GLYCOLAX/MIRALAX) 17 g, Daily   tamsulosin (FLOMAX) 0.8 mg, Daily   Tiotropium Bromide-Olodaterol 2.5-2.5 MCG/ACT AERS 2 each, Every morning   traZODone (DESYREL) 75 mg, Daily at bedtime   vitamin B-12 (CYANOCOBALAMIN) 100 mcg, Daily    Diet Orders (From admission, onward)     Start     Ordered   03/17/23 0421  Diet heart healthy/carb modified Room service appropriate? Yes; Fluid consistency: Thin  Diet effective now       Question Answer Comment  Diet-HS Snack? Nothing   Room service appropriate? Yes   Fluid consistency: Thin      03/17/23 0420            DVT prophylaxis: SCDs Start: 03/17/23 0421 Place TED hose Start: 03/17/23 0421 apixaban (ELIQUIS) tablet 5 mg   Lab Results  Component Value Date   PLT 251 03/30/2023  Code Status: Full Code  Family Communication: No family at bedside  Status is: Inpatient Remains inpatient appropriate because: severity of illness  Level of care: Progressive  Consultants:  none  Objective: Vitals:   03/30/23 1957 03/30/23 2208 03/31/23 0230 03/31/23 0454  BP: 136/84   119/67  Pulse: 97   77  Resp: 16 19 17 20   Temp: 97.9 F (36.6 C)   98.2 F (36.8 C)  TempSrc: Oral   Oral  SpO2: 93% 92%  96%  Weight:      Height:        Intake/Output Summary (Last 24 hours) at 03/31/2023 1002 Last data filed at 03/31/2023 0743 Gross per 24 hour  Intake 603 ml  Output 3150 ml  Net -2547 ml   Wt Readings from Last 3 Encounters:  03/17/23 (!) 141.3 kg  01/26/23 127 kg  10/29/22 136.1 kg    Examination: Constitutional: NAD Eyes: lids and conjunctivae normal, no scleral icterus ENMT: mmm Neck: normal, supple Respiratory: Faint bibasilar rhonchi, no wheezing Cardiovascular: Regular rate and rhythm, no murmurs / rubs / gallops.  Trace edema Abdomen: soft, no distention, no tenderness. Bowel sounds positive.   Data Reviewed: I  have independently reviewed following labs and imaging studies   CBC Recent Labs  Lab 03/25/23 0551 03/27/23 1046 03/28/23 0532 03/30/23 0525  WBC 12.4* 15.3* 13.6* 12.6*  HGB 13.4 14.0 13.2 13.1  HCT 41.0 42.4 40.2 39.5  PLT 283 302 273 251  MCV 94.3 93.0 93.1 92.7  MCH 30.8 30.7 30.6 30.8  MCHC 32.7 33.0 32.8 33.2  RDW 13.3 13.6 13.5 13.6  LYMPHSABS 3.1  --  3.0  --   MONOABS 1.2*  --  1.3*  --   EOSABS 0.4  --  0.3  --   BASOSABS 0.1  --  0.1  --     Recent Labs  Lab 03/25/23 0551 03/27/23 1046 03/28/23 0532 03/30/23 0525 03/31/23 0452  NA 136 135 133* 132* 129*  K 4.3 4.7 4.4 3.9 3.6  CL 98 94* 96* 98 94*  CO2 31 32 29 26 26   GLUCOSE 162* 137* 167* 157* 138*  BUN 21 17 23 16 18   CREATININE 0.94 0.88 1.05 0.75 0.92  CALCIUM 8.4* 9.1 8.4* 8.2* 8.5*  AST 13*  --  15 15  --   ALT 7  --  22 19  --   ALKPHOS 60  --  62 58  --   BILITOT 0.5  --  0.5 0.5  --   ALBUMIN 3.3*  --  3.2* 3.2*  --   MG 2.2 2.3 2.3 2.3 2.4  BNP  --  36.3  --   --   --     ------------------------------------------------------------------------------------------------------------------ No results for input(s): "CHOL", "HDL", "LDLCALC", "TRIG", "CHOLHDL", "LDLDIRECT" in the last 72 hours.  Lab Results  Component Value Date   HGBA1C 6.7 (H) 03/18/2023   ------------------------------------------------------------------------------------------------------------------ No results for input(s): "TSH", "T4TOTAL", "T3FREE", "THYROIDAB" in the last 72 hours.  Invalid input(s): "FREET3"  Cardiac Enzymes No results for input(s): "CKMB", "TROPONINI", "MYOGLOBIN" in the last 168 hours.  Invalid input(s): "CK" ------------------------------------------------------------------------------------------------------------------    Component Value Date/Time   BNP 36.3 03/27/2023 1046    CBG: Recent Labs  Lab 03/30/23 0724 03/30/23 1112 03/30/23 1630 03/30/23 2110 03/31/23 0712  GLUCAP  103* 113* 284* 110* 120*    Recent Results (from the past 240 hours)  Expectorated Sputum Assessment w Gram Stain, Rflx to Resp Cult  Status: None   Collection Time: 03/24/23  4:56 PM   Specimen: Expectorated Sputum  Result Value Ref Range Status   Specimen Description EXPECTORATED SPUTUM  Final   Special Requests NONE  Final   Sputum evaluation   Final    THIS SPECIMEN IS ACCEPTABLE FOR SPUTUM CULTURE Performed at [facility], [address], [place], [place] [address]    Report Status 03/24/2023 FINAL  Final  Culture, Respiratory w Gram Stain     Status: None   Collection Time: 03/24/23  4:56 PM  Result Value Ref Range Status   Specimen Description   Final    EXPECTORATED SPUTUM Performed at [facility], [address] [place], [place] [address]    Special Requests   Final    NONE Reflexed from [phone] [id number] Performed at [facility], [address], [place], [place] [address]    Gram Stain   Final    FEW WBC PRESENT, PREDOMINANTLY PMN RARE GRAM POSITIVE COCCI RARE BUDDING YEAST SEEN    Culture   Final    MODERATE Normal respiratory flora-no Staph aureus or Pseudomonas seen Performed at [facility], [address], [place], [place] [address]    Report Status 03/27/2023 FINAL  Final     Radiology Studies: No results found.   Pamella Pert, MD, PhD [facility]  Between 7 am - 7 pm I am available, please contact me via Amion (for emergencies) or Securechat (non urgent messages)  Between 7 pm - 7 am I am not available, please contact night coverage MD/APP via Amion

## 2023-04-01 ENCOUNTER — Encounter (HOSPITAL_COMMUNITY): Payer: Self-pay

## 2023-04-01 ENCOUNTER — Inpatient Hospital Stay (HOSPITAL_COMMUNITY)
Admission: EM | Admit: 2023-04-01 | Discharge: 2023-04-24 | DRG: 163 | Disposition: A | Discharge status: Home / Self Care | Payer: No Typology Code available for payment source | Source: Skilled Nursing Facility | Attending: Internal Medicine | Admitting: Internal Medicine

## 2023-04-01 ENCOUNTER — Other Ambulatory Visit (HOSPITAL_COMMUNITY): Payer: Self-pay

## 2023-04-01 ENCOUNTER — Other Ambulatory Visit: Payer: Self-pay

## 2023-04-01 ENCOUNTER — Emergency Department (HOSPITAL_COMMUNITY): Payer: No Typology Code available for payment source

## 2023-04-01 DIAGNOSIS — M456 Ankylosing spondylitis lumbar region: Secondary | ICD-10-CM | POA: Diagnosis present

## 2023-04-01 DIAGNOSIS — R042 Hemoptysis: Secondary | ICD-10-CM

## 2023-04-01 DIAGNOSIS — T45515A Adverse effect of anticoagulants, initial encounter: Secondary | ICD-10-CM | POA: Diagnosis not present

## 2023-04-01 DIAGNOSIS — Z885 Allergy status to narcotic agent status: Secondary | ICD-10-CM

## 2023-04-01 DIAGNOSIS — Z9221 Personal history of antineoplastic chemotherapy: Secondary | ICD-10-CM

## 2023-04-01 DIAGNOSIS — I252 Old myocardial infarction: Secondary | ICD-10-CM

## 2023-04-01 DIAGNOSIS — R0600 Dyspnea, unspecified: Principal | ICD-10-CM

## 2023-04-01 DIAGNOSIS — Z7982 Long term (current) use of aspirin: Secondary | ICD-10-CM

## 2023-04-01 DIAGNOSIS — Z7951 Long term (current) use of inhaled steroids: Secondary | ICD-10-CM

## 2023-04-01 DIAGNOSIS — G4733 Obstructive sleep apnea (adult) (pediatric): Secondary | ICD-10-CM | POA: Diagnosis present

## 2023-04-01 DIAGNOSIS — K219 Gastro-esophageal reflux disease without esophagitis: Secondary | ICD-10-CM | POA: Diagnosis present

## 2023-04-01 DIAGNOSIS — J189 Pneumonia, unspecified organism: Secondary | ICD-10-CM | POA: Diagnosis not present

## 2023-04-01 DIAGNOSIS — Z9151 Personal history of suicidal behavior: Secondary | ICD-10-CM

## 2023-04-01 DIAGNOSIS — J9611 Chronic respiratory failure with hypoxia: Secondary | ICD-10-CM

## 2023-04-01 DIAGNOSIS — J9621 Acute and chronic respiratory failure with hypoxia: Secondary | ICD-10-CM | POA: Diagnosis not present

## 2023-04-01 DIAGNOSIS — E119 Type 2 diabetes mellitus without complications: Secondary | ICD-10-CM

## 2023-04-01 DIAGNOSIS — E861 Hypovolemia: Secondary | ICD-10-CM | POA: Diagnosis not present

## 2023-04-01 DIAGNOSIS — Z888 Allergy status to other drugs, medicaments and biological substances status: Secondary | ICD-10-CM

## 2023-04-01 DIAGNOSIS — F411 Generalized anxiety disorder: Secondary | ICD-10-CM | POA: Diagnosis present

## 2023-04-01 DIAGNOSIS — J440 Chronic obstructive pulmonary disease with acute lower respiratory infection: Secondary | ICD-10-CM | POA: Diagnosis not present

## 2023-04-01 DIAGNOSIS — Z9981 Dependence on supplemental oxygen: Secondary | ICD-10-CM

## 2023-04-01 DIAGNOSIS — Z91018 Allergy to other foods: Secondary | ICD-10-CM

## 2023-04-01 DIAGNOSIS — Z85118 Personal history of other malignant neoplasm of bronchus and lung: Secondary | ICD-10-CM

## 2023-04-01 DIAGNOSIS — E871 Hypo-osmolality and hyponatremia: Secondary | ICD-10-CM | POA: Diagnosis present

## 2023-04-01 DIAGNOSIS — I5032 Chronic diastolic (congestive) heart failure: Secondary | ICD-10-CM | POA: Diagnosis present

## 2023-04-01 DIAGNOSIS — Z6839 Body mass index (BMI) 39.0-39.9, adult: Secondary | ICD-10-CM

## 2023-04-01 DIAGNOSIS — D6832 Hemorrhagic disorder due to extrinsic circulating anticoagulants: Secondary | ICD-10-CM | POA: Diagnosis not present

## 2023-04-01 DIAGNOSIS — N400 Enlarged prostate without lower urinary tract symptoms: Secondary | ICD-10-CM | POA: Diagnosis present

## 2023-04-01 DIAGNOSIS — Z923 Personal history of irradiation: Secondary | ICD-10-CM

## 2023-04-01 DIAGNOSIS — Z87891 Personal history of nicotine dependence: Secondary | ICD-10-CM

## 2023-04-01 DIAGNOSIS — J209 Acute bronchitis, unspecified: Secondary | ICD-10-CM | POA: Diagnosis present

## 2023-04-01 DIAGNOSIS — Z7984 Long term (current) use of oral hypoglycemic drugs: Secondary | ICD-10-CM

## 2023-04-01 DIAGNOSIS — Z86711 Personal history of pulmonary embolism: Secondary | ICD-10-CM | POA: Diagnosis present

## 2023-04-01 DIAGNOSIS — E1159 Type 2 diabetes mellitus with other circulatory complications: Secondary | ICD-10-CM | POA: Diagnosis present

## 2023-04-01 DIAGNOSIS — R040 Epistaxis: Secondary | ICD-10-CM

## 2023-04-01 DIAGNOSIS — I152 Hypertension secondary to endocrine disorders: Secondary | ICD-10-CM | POA: Diagnosis present

## 2023-04-01 DIAGNOSIS — Z820 Family history of epilepsy and other diseases of the nervous system: Secondary | ICD-10-CM

## 2023-04-01 DIAGNOSIS — G4700 Insomnia, unspecified: Secondary | ICD-10-CM | POA: Diagnosis present

## 2023-04-01 DIAGNOSIS — Z85828 Personal history of other malignant neoplasm of skin: Secondary | ICD-10-CM

## 2023-04-01 DIAGNOSIS — J439 Emphysema, unspecified: Secondary | ICD-10-CM | POA: Diagnosis present

## 2023-04-01 DIAGNOSIS — Z79899 Other long term (current) drug therapy: Secondary | ICD-10-CM

## 2023-04-01 DIAGNOSIS — C3412 Malignant neoplasm of upper lobe, left bronchus or lung: Secondary | ICD-10-CM | POA: Diagnosis present

## 2023-04-01 DIAGNOSIS — M1990 Unspecified osteoarthritis, unspecified site: Secondary | ICD-10-CM | POA: Diagnosis present

## 2023-04-01 DIAGNOSIS — K5900 Constipation, unspecified: Secondary | ICD-10-CM | POA: Diagnosis not present

## 2023-04-01 DIAGNOSIS — E1165 Type 2 diabetes mellitus with hyperglycemia: Secondary | ICD-10-CM | POA: Diagnosis present

## 2023-04-01 DIAGNOSIS — J441 Chronic obstructive pulmonary disease with (acute) exacerbation: Secondary | ICD-10-CM | POA: Diagnosis present

## 2023-04-01 DIAGNOSIS — Z7901 Long term (current) use of anticoagulants: Secondary | ICD-10-CM

## 2023-04-01 DIAGNOSIS — I110 Hypertensive heart disease with heart failure: Secondary | ICD-10-CM | POA: Diagnosis present

## 2023-04-01 DIAGNOSIS — R0602 Shortness of breath: Secondary | ICD-10-CM | POA: Diagnosis not present

## 2023-04-01 LAB — GLUCOSE, CAPILLARY
Glucose-Capillary: 113 mg/dL — ABNORMAL HIGH (ref 70–99)
Glucose-Capillary: 201 mg/dL — ABNORMAL HIGH (ref 70–99)

## 2023-04-01 LAB — CBC WITH DIFFERENTIAL/PLATELET
Abs Immature Granulocytes: 0.07 10*3/uL (ref 0.00–0.07)
Basophils Absolute: 0.1 10*3/uL (ref 0.0–0.1)
Basophils Relative: 0 %
Eosinophils Absolute: 0.1 10*3/uL (ref 0.0–0.5)
Eosinophils Relative: 1 %
HCT: 42 % (ref 39.0–52.0)
Hemoglobin: 14 g/dL (ref 13.0–17.0)
Immature Granulocytes: 1 %
Lymphocytes Relative: 15 %
Lymphs Abs: 2 10*3/uL (ref 0.7–4.0)
MCH: 30.5 pg (ref 26.0–34.0)
MCHC: 33.3 g/dL (ref 30.0–36.0)
MCV: 91.5 fL (ref 80.0–100.0)
Monocytes Absolute: 1.1 10*3/uL — ABNORMAL HIGH (ref 0.1–1.0)
Monocytes Relative: 8 %
Neutro Abs: 10.4 10*3/uL — ABNORMAL HIGH (ref 1.7–7.7)
Neutrophils Relative %: 75 %
Platelets: 292 10*3/uL (ref 150–400)
RBC: 4.59 MIL/uL (ref 4.22–5.81)
RDW: 13.2 % (ref 11.5–15.5)
WBC: 13.7 10*3/uL — ABNORMAL HIGH (ref 4.0–10.5)
nRBC: 0 % (ref 0.0–0.2)

## 2023-04-01 LAB — CBG MONITORING, ED: Glucose-Capillary: 112 mg/dL — ABNORMAL HIGH (ref 70–99)

## 2023-04-01 LAB — COMPREHENSIVE METABOLIC PANEL
ALT: 17 U/L (ref 0–44)
ALT: 18 U/L (ref 0–44)
AST: 15 U/L (ref 15–41)
AST: 18 U/L (ref 15–41)
Albumin: 3 g/dL — ABNORMAL LOW (ref 3.5–5.0)
Albumin: 3.7 g/dL (ref 3.5–5.0)
Alkaline Phosphatase: 58 U/L (ref 38–126)
Alkaline Phosphatase: 65 U/L (ref 38–126)
Anion gap: 8 (ref 5–15)
Anion gap: 9 (ref 5–15)
BUN: 19 mg/dL (ref 8–23)
BUN: 18 mg/dL (ref 8–23)
CO2: 26 mmol/L (ref 22–32)
CO2: 25 mmol/L (ref 22–32)
Calcium: 8.3 mg/dL — ABNORMAL LOW (ref 8.9–10.3)
Calcium: 8.6 mg/dL — ABNORMAL LOW (ref 8.9–10.3)
Chloride: 98 mmol/L (ref 98–111)
Chloride: 96 mmol/L — ABNORMAL LOW (ref 98–111)
Creatinine, Ser: 0.9 mg/dL (ref 0.61–1.24)
Creatinine, Ser: 0.75 mg/dL (ref 0.61–1.24)
GFR, Estimated: >60 mL/min (ref >60)
GFR, Estimated: >60 mL/min (ref >60)
Glucose, Bld: 133 mg/dL — ABNORMAL HIGH (ref 70–99)
Glucose, Bld: 156 mg/dL — ABNORMAL HIGH (ref 70–99)
Potassium: 4.1 mmol/L (ref 3.5–5.1)
Potassium: 4.2 mmol/L (ref 3.5–5.1)
Sodium: 132 mmol/L — ABNORMAL LOW (ref 135–145)
Sodium: 130 mmol/L — ABNORMAL LOW (ref 135–145)
Total Bilirubin: 0.6 mg/dL (ref 0.0–1.2)
Total Bilirubin: 0.6 mg/dL (ref 0.0–1.2)
Total Protein: 6.1 g/dL — ABNORMAL LOW (ref 6.5–8.1)
Total Protein: 6.9 g/dL (ref 6.5–8.1)

## 2023-04-01 LAB — CBC
HCT: 40.2 % (ref 39.0–52.0)
Hemoglobin: 12.9 g/dL — ABNORMAL LOW (ref 13.0–17.0)
MCH: 30.6 pg (ref 26.0–34.0)
MCHC: 32.1 g/dL (ref 30.0–36.0)
MCV: 95.3 fL (ref 80.0–100.0)
Platelets: 260 10*3/uL (ref 150–400)
RBC: 4.22 MIL/uL (ref 4.22–5.81)
RDW: 13.4 % (ref 11.5–15.5)
WBC: 12.4 10*3/uL — ABNORMAL HIGH (ref 4.0–10.5)
nRBC: 0 % (ref 0.0–0.2)

## 2023-04-01 LAB — BLOOD GAS, VENOUS
Acid-Base Excess: 6.2 mmol/L — ABNORMAL HIGH (ref 0.0–2.0)
Bicarbonate: 30.6 mmol/L — ABNORMAL HIGH (ref 20.0–28.0)
O2 Saturation: 84 %
Patient temperature: 37
pCO2, Ven: 42 mm[Hg] — ABNORMAL LOW (ref 44–60)
pH, Ven: 7.47 — ABNORMAL HIGH (ref 7.25–7.43)
pO2, Ven: 50 mm[Hg] — ABNORMAL HIGH (ref 32–45)

## 2023-04-01 LAB — BRAIN NATRIURETIC PEPTIDE: B Natriuretic Peptide: 29 pg/mL (ref 0.0–100.0)

## 2023-04-01 LAB — MAGNESIUM: Magnesium: 2.2 mg/dL (ref 1.7–2.4)

## 2023-04-01 LAB — TROPONIN I (HIGH SENSITIVITY)
Troponin I (High Sensitivity): 11 ng/L (ref <18)
Troponin I (High Sensitivity): 13 ng/L (ref <18)

## 2023-04-01 MED ORDER — TAMSULOSIN HCL 0.4 MG PO CAPS
0.8000 mg | ORAL_CAPSULE | Freq: Every day | ORAL | Status: DC
  Administered 2023-04-02 – 2023-04-24 (×22): 0.8 mg via ORAL
  Filled 2023-04-01 (×23): qty 2

## 2023-04-01 MED ORDER — GABAPENTIN 100 MG PO CAPS
100.0000 mg | ORAL_CAPSULE | Freq: Three times a day (TID) | ORAL | Status: DC
  Administered 2023-04-02 – 2023-04-24 (×66): 100 mg via ORAL
  Filled 2023-04-01 (×65): qty 1

## 2023-04-01 MED ORDER — BUSPIRONE HCL 5 MG PO TABS
15.0000 mg | ORAL_TABLET | Freq: Every day | ORAL | Status: DC
  Administered 2023-04-02 – 2023-04-24 (×22): 15 mg via ORAL
  Filled 2023-04-01 (×23): qty 3

## 2023-04-01 MED ORDER — GUAIFENESIN ER 600 MG PO TB12
1200.0000 mg | ORAL_TABLET | Freq: Two times a day (BID) | ORAL | Status: DC
  Administered 2023-04-01 – 2023-04-23 (×44): 1200 mg via ORAL
  Filled 2023-04-01 (×43): qty 2

## 2023-04-01 MED ORDER — TRAZODONE HCL 50 MG PO TABS
75.0000 mg | ORAL_TABLET | Freq: Every day | ORAL | Status: DC
Start: 2023-04-02 — End: 2023-04-24
  Administered 2023-04-02 – 2023-04-23 (×23): 75 mg via ORAL
  Filled 2023-04-01 (×23): qty 2

## 2023-04-01 MED ORDER — INSULIN ASPART 100 UNIT/ML IJ SOLN
0.0000 [IU] | Freq: Every day | INTRAMUSCULAR | Status: DC
  Administered 2023-04-03 – 2023-04-10 (×2): 2 [IU] via SUBCUTANEOUS
  Filled 2023-04-01: qty 0.05

## 2023-04-01 MED ORDER — METHYLPREDNISOLONE SODIUM SUCC 40 MG IJ SOLR
40.0000 mg | Freq: Once | INTRAMUSCULAR | Status: AC
  Administered 2023-04-01: 40 mg via INTRAVENOUS
  Filled 2023-04-01: qty 1

## 2023-04-01 MED ORDER — DOCUSATE SODIUM 100 MG PO CAPS
200.0000 mg | ORAL_CAPSULE | Freq: Two times a day (BID) | ORAL | Status: DC
  Administered 2023-04-02 – 2023-04-15 (×27): 200 mg via ORAL
  Administered 2023-04-16: 100 mg via ORAL
  Administered 2023-04-16 – 2023-04-17 (×2): 200 mg via ORAL
  Administered 2023-04-18: 100 mg via ORAL
  Administered 2023-04-18 – 2023-04-23 (×10): 200 mg via ORAL
  Filled 2023-04-01 (×45): qty 2

## 2023-04-01 MED ORDER — ARFORMOTEROL TARTRATE 15 MCG/2ML IN NEBU
15.0000 ug | INHALATION_SOLUTION | Freq: Two times a day (BID) | RESPIRATORY_TRACT | Status: DC
  Administered 2023-04-01 – 2023-04-24 (×37): 15 ug via RESPIRATORY_TRACT
  Filled 2023-04-01 (×42): qty 2

## 2023-04-01 MED ORDER — IPRATROPIUM-ALBUTEROL 0.5-2.5 (3) MG/3ML IN SOLN
3.0000 mL | Freq: Four times a day (QID) | RESPIRATORY_TRACT | Status: DC | PRN
  Administered 2023-04-02 – 2023-04-14 (×3): 3 mL via RESPIRATORY_TRACT
  Filled 2023-04-01 (×3): qty 3

## 2023-04-01 MED ORDER — BISACODYL 5 MG PO TBEC
10.0000 mg | DELAYED_RELEASE_TABLET | Freq: Every day | ORAL | Status: DC | PRN
  Administered 2023-04-06: 10 mg via ORAL
  Filled 2023-04-01: qty 2

## 2023-04-01 MED ORDER — DILTIAZEM HCL ER COATED BEADS 180 MG PO CP24
180.0000 mg | ORAL_CAPSULE | Freq: Every day | ORAL | Status: DC
  Administered 2023-04-02 – 2023-04-24 (×22): 180 mg via ORAL
  Filled 2023-04-01 (×22): qty 1

## 2023-04-01 MED ORDER — ACETAMINOPHEN 325 MG PO TABS
650.0000 mg | ORAL_TABLET | Freq: Four times a day (QID) | ORAL | Status: DC | PRN
  Administered 2023-04-01 – 2023-04-23 (×41): 650 mg via ORAL
  Filled 2023-04-01 (×42): qty 2

## 2023-04-01 MED ORDER — MELATONIN 3 MG PO TABS
3.0000 mg | ORAL_TABLET | Freq: Every day | ORAL | Status: DC
  Administered 2023-04-02 – 2023-04-23 (×23): 3 mg via ORAL
  Filled 2023-04-01 (×25): qty 1

## 2023-04-01 MED ORDER — PSYLLIUM 95 % PO PACK
1.0000 | PACK | Freq: Every day | ORAL | 0 refills | Status: DC
  Filled 2023-04-01: qty 30, 30d supply, fill #0

## 2023-04-01 MED ORDER — ONDANSETRON HCL 4 MG PO TABS
4.0000 mg | ORAL_TABLET | Freq: Four times a day (QID) | ORAL | Status: DC | PRN
  Administered 2023-04-20 – 2023-04-24 (×6): 4 mg via ORAL
  Filled 2023-04-01 (×6): qty 1

## 2023-04-01 MED ORDER — ONDANSETRON HCL 4 MG/2ML IJ SOLN
4.0000 mg | Freq: Four times a day (QID) | INTRAMUSCULAR | Status: DC | PRN
  Administered 2023-04-16 – 2023-04-22 (×3): 4 mg via INTRAVENOUS
  Filled 2023-04-01 (×3): qty 2

## 2023-04-01 MED ORDER — BUPROPION HCL ER (XL) 150 MG PO TB24
300.0000 mg | ORAL_TABLET | Freq: Every morning | ORAL | Status: DC
  Administered 2023-04-02 – 2023-04-24 (×22): 300 mg via ORAL
  Filled 2023-04-01 (×23): qty 2

## 2023-04-01 MED ORDER — DULOXETINE HCL 60 MG PO CPEP
60.0000 mg | ORAL_CAPSULE | Freq: Two times a day (BID) | ORAL | Status: DC
Start: 2023-04-02 — End: 2023-04-24
  Administered 2023-04-02 – 2023-04-24 (×45): 60 mg via ORAL
  Filled 2023-04-01 (×45): qty 1

## 2023-04-01 MED ORDER — OXCARBAZEPINE 300 MG PO TABS
600.0000 mg | ORAL_TABLET | Freq: Three times a day (TID) | ORAL | Status: DC
Start: 2023-04-02 — End: 2023-04-24
  Administered 2023-04-02 – 2023-04-24 (×67): 600 mg via ORAL
  Filled 2023-04-01 (×70): qty 2

## 2023-04-01 MED ORDER — PANTOPRAZOLE SODIUM 40 MG PO TBEC
40.0000 mg | DELAYED_RELEASE_TABLET | Freq: Every day | ORAL | Status: DC
Start: 2023-04-02 — End: 2023-04-03
  Administered 2023-04-02 – 2023-04-03 (×2): 40 mg via ORAL
  Filled 2023-04-01 (×2): qty 1

## 2023-04-01 MED ORDER — SENNOSIDES-DOCUSATE SODIUM 8.6-50 MG PO TABS: 1.0000 | ORAL_TABLET | Freq: Every evening | ORAL | Status: DC | PRN

## 2023-04-01 MED ORDER — PREDNISONE 10 MG PO TABS
10.0000 mg | ORAL_TABLET | Freq: Every day | ORAL | 0 refills | Status: DC
  Filled 2023-04-01: qty 2, 2d supply, fill #0

## 2023-04-01 MED ORDER — PREDNISONE 5 MG PO TABS
10.0000 mg | ORAL_TABLET | Freq: Every day | ORAL | Status: DC
  Administered 2023-04-02 – 2023-04-03 (×2): 10 mg via ORAL
  Filled 2023-04-01 (×2): qty 2

## 2023-04-01 MED ORDER — GABAPENTIN 100 MG PO CAPS
100.0000 mg | ORAL_CAPSULE | ORAL | Status: AC
  Administered 2023-04-02: 100 mg via ORAL
  Filled 2023-04-01: qty 1

## 2023-04-01 MED ORDER — ASPIRIN 81 MG PO TBEC
81.0000 mg | DELAYED_RELEASE_TABLET | Freq: Every day | ORAL | Status: DC
Start: 2023-04-02 — End: 2023-04-17
  Administered 2023-04-02 – 2023-04-16 (×15): 81 mg via ORAL
  Filled 2023-04-01 (×16): qty 1

## 2023-04-01 MED ORDER — ACETAMINOPHEN 650 MG RE SUPP: 650.0000 mg | Freq: Four times a day (QID) | RECTAL | Status: DC | PRN

## 2023-04-01 MED ORDER — APIXABAN 5 MG PO TABS
5.0000 mg | ORAL_TABLET | Freq: Two times a day (BID) | ORAL | Status: DC
  Administered 2023-04-02 – 2023-04-03 (×4): 5 mg via ORAL
  Filled 2023-04-01 (×4): qty 1

## 2023-04-01 MED ORDER — INSULIN ASPART 100 UNIT/ML IJ SOLN
0.0000 [IU] | Freq: Three times a day (TID) | INTRAMUSCULAR | Status: DC
  Administered 2023-04-02: 1 [IU] via SUBCUTANEOUS
  Administered 2023-04-02: 2 [IU] via SUBCUTANEOUS
  Administered 2023-04-02: 3 [IU] via SUBCUTANEOUS
  Administered 2023-04-03: 1 [IU] via SUBCUTANEOUS
  Administered 2023-04-03: 2 [IU] via SUBCUTANEOUS
  Administered 2023-04-04: 1 [IU] via SUBCUTANEOUS
  Administered 2023-04-04: 2 [IU] via SUBCUTANEOUS
  Administered 2023-04-04: 3 [IU] via SUBCUTANEOUS
  Administered 2023-04-05: 2 [IU] via SUBCUTANEOUS
  Administered 2023-04-05: 1 [IU] via SUBCUTANEOUS
  Administered 2023-04-05: 2 [IU] via SUBCUTANEOUS
  Administered 2023-04-06 (×2): 1 [IU] via SUBCUTANEOUS
  Administered 2023-04-06: 3 [IU] via SUBCUTANEOUS
  Administered 2023-04-07: 1 [IU] via SUBCUTANEOUS
  Administered 2023-04-07: 3 [IU] via SUBCUTANEOUS
  Administered 2023-04-07: 1 [IU] via SUBCUTANEOUS
  Administered 2023-04-08: 3 [IU] via SUBCUTANEOUS
  Administered 2023-04-09 – 2023-04-10 (×2): 5 [IU] via SUBCUTANEOUS
  Administered 2023-04-10 – 2023-04-11 (×4): 1 [IU] via SUBCUTANEOUS
  Administered 2023-04-11 – 2023-04-12 (×2): 5 [IU] via SUBCUTANEOUS
  Administered 2023-04-12: 2 [IU] via SUBCUTANEOUS
  Administered 2023-04-12: 1 [IU] via SUBCUTANEOUS
  Administered 2023-04-13: 2 [IU] via SUBCUTANEOUS
  Administered 2023-04-13: 1 [IU] via SUBCUTANEOUS
  Administered 2023-04-13: 5 [IU] via SUBCUTANEOUS
  Administered 2023-04-14: 3 [IU] via SUBCUTANEOUS
  Administered 2023-04-14: 7 [IU] via SUBCUTANEOUS
  Administered 2023-04-14: 1 [IU] via SUBCUTANEOUS
  Administered 2023-04-15 (×3): 2 [IU] via SUBCUTANEOUS
  Administered 2023-04-16: 5 [IU] via SUBCUTANEOUS
  Administered 2023-04-16 – 2023-04-17 (×3): 1 [IU] via SUBCUTANEOUS
  Administered 2023-04-17: 2 [IU] via SUBCUTANEOUS
  Administered 2023-04-18: 3 [IU] via SUBCUTANEOUS
  Administered 2023-04-18: 1 [IU] via SUBCUTANEOUS
  Administered 2023-04-18: 3 [IU] via SUBCUTANEOUS
  Administered 2023-04-19 – 2023-04-20 (×2): 2 [IU] via SUBCUTANEOUS
  Administered 2023-04-20: 1 [IU] via SUBCUTANEOUS
  Administered 2023-04-20: 2 [IU] via SUBCUTANEOUS
  Administered 2023-04-21 (×3): 1 [IU] via SUBCUTANEOUS
  Administered 2023-04-22 – 2023-04-23 (×2): 2 [IU] via SUBCUTANEOUS
  Administered 2023-04-23: 1 [IU] via SUBCUTANEOUS
  Filled 2023-04-01: qty 0.09

## 2023-04-01 MED ORDER — BUDESONIDE 0.25 MG/2ML IN SUSP
0.2500 mg | Freq: Two times a day (BID) | RESPIRATORY_TRACT | Status: DC
  Administered 2023-04-01 – 2023-04-24 (×36): 0.25 mg via RESPIRATORY_TRACT
  Filled 2023-04-01 (×43): qty 2

## 2023-04-01 NOTE — ED Triage Notes (Signed)
BIB EMS from home for sob. Pt O2 was in the 70s at home. Pt wears 5L [place] at home. Pt is coughing up pink frothy sputum.  D/c today with PNA and CHF, but not prescribed any medications to manage CHF.  Hx of lung cancer

## 2023-04-01 NOTE — H&P (Signed)
History and Physical    Mario [name] UJW:[id number] DOB: 24-Jul-1950 DOA: 04/01/2023  PCP: [name]  Patient coming from: Home  I have personally briefly reviewed patient's old medical records in [facility]  Chief Complaint: Shortness of breath  HPI: Mario [name] is a 73 y.o. male with medical history significant for COPD, chronic hypoxic respiratory failure on 5 L O2 [place], history of PE on Eliquis, history of left lung cancer (s/p radiation), T2DM, HTN, BPH, GAD who presents to the ED for evaluation of shortness of breath.  Patient was discharged earlier today after being admitted initially on 03/17/2023 for acute on chronic hypoxic respiratory failure in the setting of community-acquired pneumonia, RSV infection, and COPD exacerbation.  He completed a course of steroids and antibiotics.  TTE 03/24/2023 showed EF 60-65%, no RWMA, G1DD.  Patient was discharged home today with SpO2 >94% on 5 L [place] at rest and 6 L [place] when ambulating and 2 more days of prednisone.  Patient states that he got home, rested for a little while.  When he walked up a flight of stairs he became very short of breath.  He saw that his SpO2 dropped into the low 80s while wearing his 5 L O2 via [place].  He also noted that his heart rate increased to 130s.  He felt bad enough that he called EMS to be brought back to the hospital.  Patient states that he has persistent cough that is mostly dry.  He states that he does not smoke.  ED Course  Labs/Imaging on admission: I have personally reviewed following labs and imaging studies.  Vitals showed BP 136/83, pulse 112, RR 26, temp 98.4 F, SpO2 93% on 6 L O2 via [place].  Labs showed WBC 13.7, hemoglobin 14.0, platelets 292,000, sodium 130, potassium 4.2, bicarb 25, BUN 18, creatinine 0.75, serum glucose 156, LFTs within normal limits, troponin 11.  BNP in process.  Portable chest x-ray with little change in radiographic appearance of the chest with small right  effusion and irregular and bandlike right lower lung opacity.  Chronic pleural and consolidative process in the left base, some of which may correspond to the history of lung cancer.  The hospitalist service was consulted to admit.  Review of Systems: All systems reviewed and are negative except as documented in history of present illness above.   Past Medical History:  Diagnosis Date   Ankylosing spondylitis lumbar region [facility]) 09/25/2022   Anxiety    Bronchitis    COPD (chronic obstructive pulmonary disease) (HCC)    Depression    History of radiation therapy    Left lung- 10/05/20-10/15/20- Dr. Antony Blackbird   Hypertension    lung ca 09/2020   MI (myocardial infarction) [facility])    ????   On home oxygen therapy    4L/min [place]   OSA (obstructive sleep apnea)    Suicide attempt (HCC)    Tension pneumothorax 06/27/2016   Uveitis     Past Surgical History:  Procedure Laterality Date   BIOPSY  07/03/2021   Procedure: BIOPSY;  Surgeon: Kathi Der, MD;  Location: [facility] ENDOSCOPY;  Service: Gastroenterology;;   BRONCHIAL BIOPSY  07/30/2020   Procedure: BRONCHIAL BIOPSIES;  Surgeon: Josephine Igo, DO;  Location: [facility] ENDOSCOPY;  Service: Pulmonary;;   BRONCHIAL BRUSHINGS  07/30/2020   Procedure: BRONCHIAL BRUSHINGS;  Surgeon: Josephine Igo, DO;  Location: [facility] ENDOSCOPY;  Service: Pulmonary;;   BRONCHIAL NEEDLE ASPIRATION BIOPSY  07/30/2020   Procedure:  BRONCHIAL NEEDLE ASPIRATION BIOPSIES;  Surgeon: Josephine Igo, DO;  Location: [facility] ENDOSCOPY;  Service: Pulmonary;;   BRONCHIAL WASHINGS  07/30/2020   Procedure: BRONCHIAL WASHINGS;  Surgeon: Josephine Igo, DO;  Location: [facility] ENDOSCOPY;  Service: Pulmonary;;   CHEST TUBE INSERTION Left 06/27/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     ESOPHAGOGASTRODUODENOSCOPY N/A 07/03/2021   Procedure: ESOPHAGOGASTRODUODENOSCOPY (EGD);  Surgeon: Kathi Der, MD;  Location: [facility];  Service: Gastroenterology;  Laterality: N/A;   IR PERC PLEURAL DRAIN  W/[place]WELL CATH W/IMG GUIDE  08/04/2021   IR REMOVAL OF PLURAL CATH W/CUFF  09/01/2021   SKIN CANCER EXCISION     VIDEO BRONCHOSCOPY WITH ENDOBRONCHIAL NAVIGATION Left 07/30/2020   Procedure: VIDEO BRONCHOSCOPY WITH ENDOBRONCHIAL NAVIGATION;  Surgeon: Josephine Igo, DO;  Location: [facility] ENDOS[facility];  Service: Pulmonary;  Laterality: Left;    Social History:  reports that he quit smoking about 6 years ago. His smoking use included cigarettes. He started smoking about 41 years ago. He has a 35 pack-year smoking history. He has been exposed to tobacco smoke. He has never used smokeless tobacco. He reports that he does not drink alcohol and does not use drugs.  Allergies  Allergen Reactions   Demerol [Meperidine] Nausea And Vomiting and Other (See Comments)    Made the patient "violently sick"   Zocor [Simvastatin] Nausea And Vomiting and Other (See Comments)    Made him very jittery, also   Beet [Beta Vulgaris] Nausea And Vomiting   Liver Nausea And Vomiting    Family History  Problem Relation Age of Onset   Dementia Father      Prior to Admission medications   Medication Sig Start Date End Date Taking? Authorizing Provider  acetaminophen (TYLENOL) 500 MG tablet Take 1,000 mg by mouth every 6 (six) hours as needed for mild pain (pain score 1-3) or moderate pain (pain score 4-6).    [provider]  albuterol (PROVENTIL) (2.5 MG/3ML) 0.083% nebulizer solution Inhale 3 mLs (2.5 mg total) by nebulization every 4 (four) hours as needed for wheezing or shortness of breath. Patient not taking: Reported on 03/17/2023 08/04/20 03/17/23  Rolly Salter, MD  albuterol (VENTOLIN HFA) 108 (90 Base) MCG/ACT inhaler Inhale 2 puffs into the lungs every 4 (four) hours as needed for wheezing or shortness of breath. 11/04/21   Glade Lloyd, MD  apixaban (ELIQUIS) 5 MG TABS tablet Take 5 mg by mouth 2 (two) times daily.    [provider]  ascorbic acid (VITAMIN C) 500 MG tablet Take 500 mg by mouth  daily.    [provider]  aspirin EC 81 MG tablet Take 81 mg by mouth daily. Swallow whole.    [provider]  azelastine (ASTELIN) 0.1 % nasal spray Place 1 spray into both nostrils 2 (two) times daily. Use in each nostril as directed    [provider]  bisacodyl (DULCOLAX) 5 MG EC tablet Take 2 tablets (10 mg total) by mouth daily as needed for moderate constipation. 01/26/23   Arby Barrette, MD  BLACK CURRANT SEED OIL PO Take 2 each by mouth daily.    [provider]  buPROPion (WELLBUTRIN XL) 300 MG 24 hr tablet Take 300 mg by mouth every morning. 02/05/20   [provider]  busPIRone (BUSPAR) 15 MG tablet Take 15 mg by mouth in the morning.    [provider]  Calcium Carbonate (CALCIUM 500 PO) Take 1 tablet by mouth daily.    [provider]  Carboxymethylcellulose Sod PF 0.5 % SOLN Place 1 drop into both eyes 4 (four) times daily as needed (dry eyes).    [provider]  cholecalciferol (VITAMIN D3) 25 MCG (1000 UNIT) tablet Take 1,000 Units by mouth daily.    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 Patient not taking: Reported on 03/17/2023    [provider]  cyclobenzaprine (FLEXERIL) 10 MG tablet Take 10 mg by mouth at bedtime.    [provider]  cycloSPORINE (RESTASIS) 0.05 % ophthalmic emulsion Place 1 drop into both eyes every 12 (twelve) hours.    [provider]  diltiazem (CARDIZEM CD) 180 MG 24 hr capsule Take 180 mg by mouth in the morning.    [provider]  docusate sodium (COLACE) 100 MG capsule Take 200 mg by mouth 2 (two) times daily.    [provider]  DULoxetine (CYMBALTA) 60 MG capsule Take 60 mg by mouth 2 (two) times daily.    [provider]  fluticasone (FLONASE) 50 MCG/ACT nasal spray Place 2 sprays into both nostrils  daily.     [provider]  folic acid (FOLVITE) 1 MG tablet Take 1 mg by mouth daily.    [provider]  gabapentin (NEURONTIN) 100 MG capsule Take 100 mg by mouth 3 (three) times daily.    [provider]  GARLIC PO Take 1 tablet by mouth daily.    [provider]  KRILL OIL PO Take 2 capsules by mouth daily.    [provider]  LACTULOSE PO Take 2 Scoops by mouth 2 (two) times daily as needed. 2 tablespoonfuls    [provider]  lidocaine (XYLOCAINE) 5 % ointment Apply 1 Application topically 2 (two) times daily as needed.    [provider]  MAGNESIUM OXIDE PO Take 1 tablet by mouth at bedtime.    [provider]  Melatonin 3 MG CAPS Take 3 mg by mouth at bedtime.    [provider]  metFORMIN (GLUCOPHAGE) 850 MG tablet Take 850 mg by mouth every evening.    [provider]  mometasone [facility]) 220 MCG/ACT inhaler Inhale 2 puffs into the lungs daily as needed. Patient not taking: Reported on 03/17/2023    [provider]  Multiple Vitamin (MULTIVITAMIN ADULT PO) Take 1 tablet by mouth daily.    [provider]  Nutritional Supplements (ENSURE ORIGINAL) LIQD Take 1 Bottle by mouth daily as needed (in between meals).    [provider]  omeprazole (PRILOSEC) 20 MG capsule Take 1 capsule (20 mg total) by mouth daily. 01/26/23   Arby Barrette, MD  Oxcarbazepine (TRILEPTAL) 300 MG tablet Take 600 mg by mouth in the morning, at noon, and at bedtime.    [provider]  polyethylene glycol powder (GLYCOLAX/MIRALAX) 17 GM/SCOOP powder Take 17 g by mouth daily.    [provider]  predniSONE (DELTASONE) 10 MG tablet Take 1 tablet (10 mg total) by mouth daily with breakfast for 2 days. 04/02/23 04/04/23  Leatha Gilding, MD  psyllium (HYDROCIL/METAMUCIL) 95 % PACK Take 1 packet by mouth daily. 04/02/23   Leatha Gilding, MD  tamsulosin (FLOMAX) 0.4 MG CAPS capsule Take  0.8 mg by mouth daily.    [provider]  Tiotropium Bromide-Olodaterol 2.5-2.5 MCG/ACT AERS Inhale 2 each into the lungs every morning. 2 puffs    [provider]  traZODone (DESYREL) 50 MG tablet Take 75 mg by mouth at bedtime.    [provider]  vitamin B-12 (CYANOCOBALAMIN) 100 MCG tablet Take 100 mcg by mouth daily.    [provider]    Physical Exam: Vitals:   04/01/23 1722 04/01/23 1725 04/01/23 1815 04/01/23 2116  BP: 136/83 136/83 (!) 144/84   Pulse: (!) 112 (!) 112 (!) 101   Resp: (!) 26  20   Temp: 98.4 F (36.9 C) 98.4 F (36.9 C)  98.2 F (36.8 C)  TempSrc: Oral Oral  Oral  SpO2: 93% 93% 93%   Weight:      Height:       Constitutional: Sitting up on the side of the bed, NAD, calm, comfortable Eyes: EOMI, lids and conjunctivae normal ENMT: Mucous membranes are moist. Posterior pharynx clear of any exudate or lesions.Normal dentition.  Neck: normal, supple, no masses. Respiratory: Inspiratory crackles left lung base with otherwise distant breath sounds.  Frequent dry cough.  Normal respiratory effort while wearing 5 L O2 via [place]. No accessory muscle use.  Cardiovascular: Regular rate and rhythm, no murmurs / rubs / gallops.  Trace bilateral lower extremity edema. 2+ pedal pulses. Abdomen: no tenderness, no masses palpated.  Musculoskeletal: no clubbing / cyanosis. No joint deformity upper and lower extremities. Good ROM, no contractures. Normal muscle tone.  Skin: no rashes, lesions, ulcers. No induration Neurologic: Sensation intact. Strength 5/5 in all 4.  Psychiatric: Normal judgment and insight. Alert and oriented x 3. Normal mood.   EKG: Personally reviewed. Sinus rhythm, rate 104, borderline prolonged PR interval, no acute ischemic changes.  Assessment/Plan Principal Problem:   COPD with acute bronchitis (HCC) Active Problems:   Chronic respiratory failure with hypoxia, on home O2 therapy (HCC) - 4 L/min   DM2 (diabetes  mellitus, type 2) (HCC)   History of pulmonary embolus (PE)   BPH (benign prostatic hyperplasia)   OSA (obstructive sleep apnea)   Hypertension associated with diabetes (HCC)   Mario [name] is a 73 y.o. male with medical history significant for COPD, chronic hypoxic respiratory failure on 5 L O2 [place], history of PE on Eliquis, history of left lung cancer (s/p radiation), T2DM, HTN, BPH, GAD who is admitted with persistent dyspnea in setting of COPD exacerbation, CAP, RSV infection.  Assessment and Plan: COPD with chronic hypoxic respiratory failure on 5 L [place] at baseline: Patient with same-day readmission for persistent exertional dyspnea.  He was admitted for 2 weeks for management of acute on chronic hypoxic respiratory failure due to COPD exacerbation, CAP, and RSV infection.  He completed antibiotics and steroids course.  Patient became very dyspneic at home after walking up a flight of stairs.  CXR is relatively unchanged from prior imaging.  Persistent dyspnea and cough likely related to bronchitis as well as complicated by obesity and conditioning.  May be some persistent COPD exacerbation component but doubt recurrent/persistent bacterial pneumonia. -Continue Brovana/Pulmicort, DuoNebs as needed -IV Solu-Medrol 40 mg x 1, changed to oral prednisone tomorrow -Continue supplemental O2 5 L via [place] -PT/OT eval  Recent admit for community-acquired pneumonia/RSV: He has completed a full course of antibiotics.  CXR unchanged from prior imaging.  No indication for further antibiotics at this time.  Hyponatremia: Persistent mild hyponatremia over the last several days.  Has been given Lasix sparingly during recent hospitalization.  Will hold diuretics and fluids for now and repeat labs in AM.  History of PE: Continue Eliquis.  Type 2  diabetes: Continue SSI.  Holding metformin.  Hypertension: Continue diltiazem 180 mg daily.  BPH: Continue Flomax.  GAD/insomnia: Continue bupropion,  BuSpar, Cymbalta, Trileptal, trazodone.  OSA: CPAP nightly.   DVT prophylaxis:  apixaban (ELIQUIS) tablet 5 mg   Code Status: Full code, confirmed with patient on admission.  ACP documents reviewed, prior MOST form lists DNR.  This was discussed with patient on admission and he confirms wishes for Full CODE STATUS. Family Communication: Discussed with patient, he has discussed with his acquaintances Disposition Plan: From home, dispo pending clinical progress Consults called: None Severity of Illness: The appropriate patient status for this patient is OBSERVATION. Observation status is judged to be reasonable and necessary in order to provide the required intensity of service to ensure the patient's safety. The patient's presenting symptoms, physical exam findings, and initial radiographic and laboratory data in the context of their medical condition is felt to place them at decreased risk for further clinical deterioration. Furthermore, it is anticipated that the patient will be medically stable for discharge from the hospital within 2 midnights of admission.   Darreld Mclean MD [facility]  If 7PM-7AM, please contact night-coverage [email or web address]  04/01/2023, 9:36 PM

## 2023-04-01 NOTE — Discharge Summary (Signed)
Physician Discharge Summary  Mario [name] HYQ:[id number] DOB: Sep 15, 1950 DOA: 03/17/2023  PCP: [name]  Admit date: 03/17/2023 Discharge date: 04/01/2023  Admitted From: home Disposition:  home  Recommendations for Outpatient Follow-up:  Follow up with PCP in 1-2 weeks  Home Health: none Equipment/Devices: none  Discharge Condition: stable CODE STATUS: Full code Diet Orders (From admission, onward)     Start     Ordered   03/31/23 1615  Diet regular Room service appropriate? Yes; Fluid consistency: Thin  Diet effective now       Question Answer Comment  Room service appropriate? Yes   Fluid consistency: Thin      03/31/23 1615            HPI: Per admitting MD, Mario [name] is a 73 y.o. 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  Patient reported that he has productive yellow and greenish.  He had sick contact at home including his caregiver.  Usually wears 5 L oxygen at the baseline but patient believes that his oxygen saturation dropping to 70% while he walks.  Patient also complaining about chest pain with coughing.  In route to ED via EMS patient was treated with bronchodilator and Solu-Medrol. During my evaluation at the bedside patient reported that with the breathing treatment patient shortness of breath has been significantly improved as well as wheezing has been improved as well.  Patient is endorsing cough with yellow and grayish sputum production.  Denies any fever and chill.  Patient is also reported that he is having exertional dyspnea.  Denies any fever  and chill.  Denies any abdominal pain, nausea, vomiting and diarrhea.  No other complaint at this time.  Hospital Course / Discharge diagnoses: Principal Problem:   Community acquired pneumonia Active Problems:   COPD with acute exacerbation (HCC)   Acute on chronic hypoxic respiratory failure (HCC)   Acute hypoxic respiratory failure (HCC)   RSV (respiratory syncytial virus pneumonia)   Hyperkalemia   Essential hypertension   History of lung cancer   History of pulmonary embolus (PE)   RSV bronchitis   Principal problem Acute on chronic hypoxic respiratory failure in the setting of community-acquired pneumonia, RSV infection -CT of the chest on admission showed chronic airspace disease at the bases, progressed since 11/03/2022.  Sputum culture showed moderate normal respiratory flora.  Has completed a course of antibiotics as well as steroids, with 2 additional days of prednisone remaining.  He has improved, able to ambulate in the hallway, is close to baseline will be discharged home in stable condition   Active problems COPD exacerbation, acute on chronic hypoxic respiratory failure-continue steroid taper, home regimen, currently on his chronic oxygen level Chest pain-chronic, for years, intermittent.  Cardiac enzymes negative.  Echo shows normal LVEF, no wall motion abnormalities, grade 1 diastolic dysfunction.  Outpatient follow-up Hyponatremia-due to Lasix, improving Hyperkalemia-resolved History of lung cancer-s/p radiation and chemotherapy.  [facility] Essential hypertension-continue diltiazem Constipation-continue outpatient bowel regimen History of PE-continue Eliquis GAD-continue BuSpar, Cymbalta DM2-continue home regimen, A1c 6.7 BPH-continue Flomax Obesity, morbid-BMI 40, he would benefit from weight loss  Sepsis ruled out  Discharge Instructions   Allergies as of 04/01/2023       Reactions   Demerol [meperidine] Nausea And Vomiting, Other (See  Comments)   Made the patient "violently sick"   Zocor [simvastatin] Nausea And Vomiting, Other (See Comments)   Made him very jittery, also   Beet [beta Vulgaris] Nausea And Vomiting   Liver Nausea And Vomiting        Medication List     TAKE these medications    acetaminophen 500 MG tablet Commonly known as: TYLENOL Take 1,000 mg by mouth every 6 (six) hours as needed for mild pain (pain score 1-3) or moderate pain (pain score 4-6).   albuterol (2.5 MG/3ML) 0.083% nebulizer solution Commonly known as: PROVENTIL Inhale 3 mLs (2.5 mg total) by nebulization every 4 (four) hours as needed for wheezing or shortness of breath.   albuterol 108 (90 Base) MCG/ACT inhaler Commonly known as: VENTOLIN HFA Inhale 2 puffs into the lungs every 4 (four) hours as needed for wheezing or shortness of breath.   ascorbic acid 500 MG tablet Commonly known as: VITAMIN C Take 500 mg by mouth daily.   aspirin EC 81 MG tablet Take 81 mg by mouth daily. Swallow whole.   azelastine 0.1 % nasal spray Commonly known as: ASTELIN Place 1 spray into both nostrils 2 (two) times daily. Use in each nostril as directed   bisacodyl 5 MG EC tablet Commonly known as: DULCOLAX Take 2 tablets (10 mg total) by mouth daily as needed for moderate constipation.   BLACK CURRANT SEED OIL PO Take 2 each by mouth daily.   buPROPion 300 MG 24 hr tablet Commonly known as: WELLBUTRIN XL Take 300 mg by mouth every morning.   busPIRone 15 MG tablet Commonly known as: BUSPAR Take 15 mg by mouth in the morning.   CALCIUM 500 PO Take 1 tablet by mouth daily.   Carboxymethylcellulose Sod PF 0.5 % Soln Place 1 drop into both eyes 4 (four) times daily as needed (dry eyes).   cholecalciferol 25 MCG (1000 UNIT) tablet Commonly known as: VITAMIN D3 Take 1,000 Units by mouth daily.   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   cyclobenzaprine 10 MG tablet Commonly known as: FLEXERIL Take 10 mg by mouth at bedtime.   cycloSPORINE 0.05 % ophthalmic emulsion Commonly known as: RESTASIS Place 1 drop into both eyes every 12 (twelve) hours.   diltiazem 180 MG 24 hr capsule Commonly known as: CARDIZEM CD Take 180 mg by mouth in the morning.   docusate sodium 100 MG capsule Commonly known as: COLACE Take 200 mg by mouth 2 (two) times daily.   DULoxetine 60 MG capsule Commonly known as: CYMBALTA Take 60 mg by mouth 2 (two) times daily.   Eliquis 5 MG Tabs tablet Generic drug: apixaban Take 5 mg by mouth 2 (two) times daily.   Ensure Original Liqd Take 1 Bottle by mouth daily as needed (in between meals).   fluticasone 50 MCG/ACT nasal spray Commonly known as: FLONASE Place 2 sprays into both nostrils daily.   folic acid 1 MG tablet Commonly known as: FOLVITE Take 1 mg by mouth daily.   gabapentin 100 MG capsule Commonly known as: NEURONTIN Take 100 mg by mouth 3 (three) times daily.   GARLIC PO Take 1 tablet by mouth daily.   KRILL OIL PO Take 2 capsules by mouth daily.  LACTULOSE PO Take 2 Scoops by mouth 2 (two) times daily as needed. 2 tablespoonfuls   lidocaine 5 % ointment Commonly known as: XYLOCAINE Apply 1 Application topically 2 (two) times daily as needed.   MAGNESIUM OXIDE PO Take 1 tablet by mouth at bedtime.   Melatonin 3 MG Caps Take 3 mg by mouth at bedtime.   metFORMIN 850 MG tablet Commonly known as: GLUCOPHAGE Take 850 mg by mouth every evening.   mometasone 220 MCG/ACT inhaler Commonly known as: ASMANEX Inhale 2 puffs into the lungs daily as needed.   MULTIVITAMIN ADULT PO Take 1 tablet by mouth daily.   omeprazole 20 MG capsule Commonly known as: PRILOSEC Take 1 capsule (20 mg total) by mouth daily.   Oxcarbazepine 300 MG tablet Commonly known as: TRILEPTAL Take 600 mg by mouth in the morning, at noon, and at bedtime.    polyethylene glycol powder 17 GM/SCOOP powder Commonly known as: GLYCOLAX/MIRALAX Take 17 g by mouth daily.   predniSONE 10 MG tablet Commonly known as: DELTASONE Take 1 tablet (10 mg total) by mouth daily with breakfast for 2 days. Start taking on: April 02, 2023   psyllium 95 % Pack Commonly known as: HYDROCIL/METAMUCIL Take 1 packet by mouth daily. Start taking on: April 02, 2023   tamsulosin 0.4 MG Caps capsule Commonly known as: FLOMAX Take 0.8 mg by mouth daily.   Tiotropium Bromide-Olodaterol 2.5-2.5 MCG/ACT Aers Inhale 2 each into the lungs every morning. 2 puffs   traZODone 50 MG tablet Commonly known as: DESYREL Take 75 mg by mouth at bedtime.   vitamin B-12 100 MCG tablet Commonly known as: CYANOCOBALAMIN Take 100 mcg by mouth daily.        Follow-up Information     [facility] week([facility]).   Contact information: [address] [facility] [place] [address] [phone]                 Consultations: none  Procedures/Studies:  DG Chest 2 View Result Date: 03/26/2023 CLINICAL DATA:  Cough.  Chronic hypoxia. EXAM: CHEST - 2 VIEW COMPARISON:  Radiograph 03/23/2023, CT 03/17/2023 FINDINGS: Stable heart size and mediastinal contours. Chronic volume loss in the left hemithorax. Chronic hyperinflation, emphysema and bronchial thickening. Increasing opacity at the right lung base. Equivocal worsening of left lung base opacity. No large pleural effusion. No pneumothorax. IMPRESSION: 1. Increasing bibasilar opacities which may represent pneumonia in the appropriate clinical setting. 2. Emphysema with chronic hyperinflation and bronchial thickening. Electronically Signed   By: Narda Rutherford M.D.   On: 03/26/2023 15:29   ECHOCARDIOGRAM COMPLETE Result Date: 03/24/2023    ECHOCARDIOGRAM REPORT   Patient Name:   JAEVIAN SHEAN [facility] Date of Exam: 03/24/2023 Medical Rec #:  [id number]        Height:       74.0 in Accession #:     [id number]       Weight:       311.5 lb Date of Birth:  20-Aug-1950        BSA:          2.624 m Patient Age:    72 years         BP:           122/67 mmHg Patient Gender: M                HR:           97 bpm. Exam Location:  Inpatient Procedure: 2D  Echo, Cardiac Doppler and Color Doppler Indications:    Chest pain  History:        Patient has prior history of Echocardiogram examinations, most                 recent 06/29/2021. COPD and CKD, Signs/Symptoms:Chest Pain; Risk                 Factors:Hypertension, Former Smoker and Diabetes.  Sonographer:    Dondra Prader RVT RCS Referring Phys: [phone] A CALDWELL POWELL JR  Sonographer Comments: Technically challenging study due to limited acoustic windows, Technically difficult study due to poor echo windows and patient is obese. Image acquisition challenging due to COPD and Image acquisition challenging due to patient body habitus. Technically limited and challenging due to very limited and poor windows IMPRESSIONS  1. Left ventricular ejection fraction, by estimation, is 60 to 65%. The left ventricle has normal function. The left ventricle has no regional wall motion abnormalities. There is mild concentric left ventricular hypertrophy. Left ventricular diastolic parameters are consistent with Grade I diastolic dysfunction (impaired relaxation).  2. Right ventricular systolic function is normal. The right ventricular size is normal. There is normal pulmonary artery systolic pressure.  3. The mitral valve is normal in structure. Trivial mitral valve regurgitation. No evidence of mitral stenosis.  4. The aortic valve is normal in structure. Aortic valve regurgitation is not visualized. No aortic stenosis is present.  5. The inferior vena cava is dilated in size with >50% respiratory variability, suggesting right atrial pressure of 8 mmHg. FINDINGS  Left Ventricle: Left ventricular ejection fraction, by estimation, is 60 to 65%. The left ventricle has normal function. The  left ventricle has no regional wall motion abnormalities. The left ventricular internal cavity size was normal in size. There is  mild concentric left ventricular hypertrophy. Left ventricular diastolic parameters are consistent with Grade I diastolic dysfunction (impaired relaxation). Right Ventricle: The right ventricular size is normal. No increase in right ventricular wall thickness. Right ventricular systolic function is normal. There is normal pulmonary artery systolic pressure. The tricuspid regurgitant velocity is 0.93 m/s, and  with an assumed right atrial pressure of 8 mmHg, the estimated right ventricular systolic pressure is 11.5 mmHg. Left Atrium: Left atrial size was normal in size. Right Atrium: Right atrial size was normal in size. Pericardium: There is no evidence of pericardial effusion. Mitral Valve: The mitral valve is normal in structure. Trivial mitral valve regurgitation. No evidence of mitral valve stenosis. Tricuspid Valve: The tricuspid valve is normal in structure. Tricuspid valve regurgitation is trivial. No evidence of tricuspid stenosis. Aortic Valve: The aortic valve is normal in structure. Aortic valve regurgitation is not visualized. No aortic stenosis is present. Aortic valve mean gradient measures 2.0 mmHg. Aortic valve peak gradient measures 3.9 mmHg. Aortic valve area, by VTI measures 3.00 cm. Pulmonic Valve: The pulmonic valve was normal in structure. Pulmonic valve regurgitation is not visualized. No evidence of pulmonic stenosis. Aorta: The aortic root is normal in size and structure. Venous: The inferior vena cava is dilated in size with greater than 50% respiratory variability, suggesting right atrial pressure of 8 mmHg. IAS/Shunts: No atrial level shunt detected by color flow Doppler.  LEFT VENTRICLE PLAX 2D LVIDd:         5.40 cm LVIDs:         3.20 cm LV PW:         1.20 cm LV IVS:        1.10  cm LVOT diam:     2.20 cm LV SV:         47 LV SV Index:   18 LVOT Area:      3.80 cm  IVC IVC diam: 2.40 cm LEFT ATRIUM           Index LA diam:      3.80 cm 1.45 cm/m LA Vol (A4C): 32.8 ml 12.50 ml/m  AORTIC VALVE                    PULMONIC VALVE AV Area (Vmax):    3.22 cm     PV Vmax:       0.98 m/s AV Area (Vmean):   3.12 cm     PV Peak grad:  3.8 mmHg AV Area (VTI):     3.00 cm AV Vmax:           99.10 cm/s AV Vmean:          65.800 cm/s AV VTI:            0.157 m AV Peak Grad:      3.9 mmHg AV Mean Grad:      2.0 mmHg LVOT Vmax:         84.00 cm/s LVOT Vmean:        54.000 cm/s LVOT VTI:          0.124 m LVOT/AV VTI ratio: 0.79  AORTA Ao Root diam: 3.60 cm TRICUSPID VALVE TR Peak grad:   3.5 mmHg TR Vmax:        93.00 cm/s  SHUNTS Systemic VTI:  0.12 m Systemic Diam: 2.20 cm Chilton Si MD Electronically signed by Chilton Si MD Signature Date/Time: 03/24/2023/5:07:48 PM    Final    DG Chest 2 View Result Date: 03/23/2023 CLINICAL DATA:  Short of breath, history of lung cancer EXAM: CHEST - 2 VIEW COMPARISON:  03/17/2023 FINDINGS: Frontal and lateral views of the chest demonstrate an unremarkable cardiac silhouette. The bibasilar consolidation seen on prior study is unchanged. Severe background emphysema is again noted, with areas of subpleural scarring and fibrosis greatest within the left lung base. No effusion or pneumothorax. No acute bony abnormalities. IMPRESSION: 1. Stable bibasilar consolidation, which may reflect pneumonia or progression of known lung cancer. 2. Stable emphysema and scarring. Electronically Signed   By: Sharlet Salina M.D.   On: 03/23/2023 16:56   CT Angio Chest PE W and/or Wo Contrast Result Date: 03/17/2023 CLINICAL DATA:  Pulmonary embolism suspected, low to intermediate probability. Positive D-dimer. EXAM: CT ANGIOGRAPHY CHEST WITH CONTRAST TECHNIQUE: Multidetector CT imaging of the chest was performed using the standard protocol during bolus administration of intravenous contrast. Multiplanar CT image reconstructions and MIPs were  obtained to evaluate the vascular anatomy. RADIATION DOSE REDUCTION: This exam was performed according to the departmental dose-optimization program which includes automated exposure control, adjustment of the mA and/or kV according to patient size and/or use of iterative reconstruction technique. CONTRAST:  OMNIPAQUE IOHEXOL 350 MG/ML SOLN COMPARISON:  11/03/2022 FINDINGS: Cardiovascular: Satisfactory opacification of the pulmonary arteries to the segmental level. No evidence of pulmonary embolism. Normal heart size. No pericardial effusion. Atheromatous calcification of the aorta and coronaries. Mediastinum/Nodes: Negative for mass or adenopathy. No esophageal thickening. Lungs/Pleura: Centrilobular emphysema and generalized airway thickening. Chronic airspace disease in the lower lungs with some volume loss. Band of opacity is also seen at the lingula. Compared to prior there is some progression of patchy nodular airspace disease in  the right lower lobe along the upper extent of the consolidation. No edema, effusion, or air leak Upper Abdomen: No acute finding or change from prior. Musculoskeletal: Remote lateral left seventh and eighth rib fractures, unchanged. Generalized thoracic spine degeneration with multilevel bridging thoracic osteophytes, chart history of ankylosing spondylitis. Review of the MIP images confirms the above findings. IMPRESSION: 1. Negative for pulmonary embolism. 2. Chronic airspace disease at the lung bases which may relate to chart history of stage IV lung cancer. Progressed area in the right lower lobe since 11/03/2022, this could be superimposed infectious pneumonia or progression of the chronic disease. 3. Atherosclerosis and emphysema. Electronically Signed   By: Tiburcio Pea M.D.   On: 03/17/2023 05:21   DG Chest 2 View Result Date: 03/17/2023 CLINICAL DATA:  Sob Increasing shortness of breath,wheezing,lung cancer COPD EXAM: CHEST - 2 VIEW COMPARISON:  Chest x-ray  10/31/2022, CT chest 11/03/2022 FINDINGS: The heart and mediastinal contours are unchanged. Atherosclerotic plaque. Chronic coarsened interstitial markings with persistent right basilar consolidation and left mid to lower lung zone consolidation. Persistent trace to small volume pleural effusions bilaterally, left greater than right. No pneumothorax. No acute osseous abnormality. IMPRESSION: 1. Chronic coarsened interstitial markings with persistent right basilar consolidation and left mid to lower lung zone consolidation. Persistent trace to small volume pleural effusions bilaterally, left greater than right. 2.  Aortic Atherosclerosis (ICD10-I70.0). Electronically Signed   By: Tish Frederickson M.D.   On: 03/17/2023 01:15     Subjective: - no chest pain, shortness of breath, no abdominal pain, nausea or vomiting.   Discharge Exam: BP 126/73   Pulse 88   Temp 97.8 F (36.6 C) (Oral)   Resp (!) 21   Ht 6\' 2"  (1.88 m)   Wt (!) 141.3 kg   SpO2 95%   BMI 40.00 kg/m   General: Pt is alert, awake, not in acute distress Cardiovascular: RRR, S1/S2 +, no rubs, no gallops Respiratory: CTA bilaterally, no wheezing, no rhonchi Abdominal: Soft, NT, ND, bowel sounds + Extremities: no edema, no cyanosis    The results of significant diagnostics from this hospitalization (including imaging, microbiology, ancillary and laboratory) are listed below for reference.     Microbiology: Recent Results (from the past 240 hours)  Expectorated Sputum Assessment w Gram Stain, Rflx to Resp Cult     Status: None   Collection Time: 03/24/23  4:56 PM   Specimen: Expectorated Sputum  Result Value Ref Range Status   Specimen Description EXPECTORATED SPUTUM  Final   Special Requests NONE  Final   Sputum evaluation   Final    THIS SPECIMEN IS ACCEPTABLE FOR SPUTUM CULTURE Performed at [facility], [address], [place], [place] [address]    Report Status 03/24/2023 FINAL  Final  Culture,  Respiratory w Gram Stain     Status: None   Collection Time: 03/24/23  4:56 PM  Result Value Ref Range Status   Specimen Description   Final    EXPECTORATED SPUTUM Performed at [facility], [address], [place], [place] [address]    Special Requests   Final    NONE Reflexed from [id number] Performed at [facility], [address], [place], [place] [address]    Gram Stain   Final    FEW WBC PRESENT, PREDOMINANTLY PMN RARE GRAM POSITIVE COCCI RARE BUDDING YEAST SEEN    Culture   Final    MODERATE Normal respiratory flora-no Staph aureus or Pseudomonas seen Performed  at [facility], [address], [place], [place] [address]    Report Status 03/27/2023 FINAL  Final     Labs: Basic Metabolic Panel: Recent Labs  Lab 03/27/23 1046 03/28/23 0532 03/30/23 0525 03/31/23 0452 04/01/23 0604  NA 135 133* 132* 129* 132*  K 4.7 4.4 3.9 3.6 4.1  CL 94* 96* 98 94* 98  CO2 32 29 26 26 26   GLUCOSE 137* 167* 157* 138* 133*  BUN 17 23 16 18 19   CREATININE 0.88 1.05 0.75 0.92 0.90  CALCIUM 9.1 8.4* 8.2* 8.5* 8.3*  MG 2.3 2.3 2.3 2.4 2.2  PHOS 3.4 3.8  --  3.7  --    Liver Function Tests: Recent Labs  Lab 03/28/23 0532 03/30/23 0525 04/01/23 0604  AST 15 15 15   ALT 22 19 17   ALKPHOS 62 58 58  BILITOT 0.5 0.5 0.6  PROT 6.4* 6.2* 6.1*  ALBUMIN 3.2* 3.2* 3.0*   CBC: Recent Labs  Lab 03/27/23 1046 03/28/23 0532 03/30/23 0525 04/01/23 0604  WBC 15.3* 13.6* 12.6* 12.4*  NEUTROABS  --  8.9*  --   --   HGB 14.0 13.2 13.1 12.9*  HCT 42.4 40.2 39.5 40.2  MCV 93.0 93.1 92.7 95.3  PLT 302 273 251 260   CBG: Recent Labs  Lab 03/31/23 0712 03/31/23 1151 03/31/23 1617 03/31/23 2254 04/01/23 0734  GLUCAP 120* 147* 178* 167* 113*   Hgb A1c No results for input(s): "HGBA1C" in the last 72 hours. Lipid Profile No results for input(s): "CHOL", "HDL", "LDLCALC", "TRIG", "CHOLHDL", "LDLDIRECT" in the last 72 hours. Thyroid function  studies No results for input(s): "TSH", "T4TOTAL", "T3FREE", "THYROIDAB" in the last 72 hours.  Invalid input(s): "FREET3" Urinalysis    Component Value Date/Time   COLORURINE YELLOW 09/25/2022 1030   APPEARANCEUR CLEAR 09/25/2022 1030   LABSPEC 1.023 09/25/2022 1030   PHURINE 6.0 09/25/2022 1030   GLUCOSEU NEGATIVE 09/25/2022 1030   HGBUR NEGATIVE 09/25/2022 1030   BILIRUBINUR NEGATIVE 09/25/2022 1030   KETONESUR NEGATIVE 09/25/2022 1030   PROTEINUR NEGATIVE 09/25/2022 1030   UROBILINOGEN 1.0 01/12/2013 0024   NITRITE NEGATIVE 09/25/2022 1030   LEUKOCYTESUR NEGATIVE 09/25/2022 1030    FURTHER DISCHARGE INSTRUCTIONS:   Get Medicines reviewed and adjusted: Please take all your medications with you for your next visit with your Primary MD   Laboratory/radiological data: Please request your Primary MD to go over all hospital tests and procedure/radiological results at the follow up, please ask your Primary MD to get all Hospital records sent to his/her office.   In some cases, they will be blood work, cultures and biopsy results pending at the time of your discharge. Please request that your primary care M.D. goes through all the records of your hospital data and follows up on these results.   Also Note the following: If you experience worsening of your admission symptoms, develop shortness of breath, life threatening emergency, suicidal or homicidal thoughts you must seek medical attention immediately by calling 911 or calling your MD immediately  if symptoms less severe.   You must read complete instructions/literature along with all the possible adverse reactions/side effects for all the Medicines you take and that have been prescribed to you. Take any new Medicines after you have completely understood and accpet all the possible adverse reactions/side effects.    Do not drive when taking Pain medications or sleeping medications (Benzodaizepines)   Do not take more than  prescribed Pain, Sleep and Anxiety Medications. It is  not advisable to combine anxiety,sleep and pain medications without talking with your primary care practitioner   Special Instructions: If you have smoked or chewed Tobacco  in the last 2 yrs please stop smoking, stop any regular Alcohol  and or any Recreational drug use.   Wear Seat belts while driving.   Please note: You were cared for by a hospitalist during your hospital stay. Once you are discharged, your primary care physician will handle any further medical issues. Please note that NO REFILLS for any discharge medications will be authorized once you are discharged, as it is imperative that you return to your primary care physician (or establish a relationship with a primary care physician if you do not have one) for your post hospital discharge needs so that they can reassess your need for medications and monitor your lab values.  Time coordinating discharge: 40 minutes  SIGNED:  Pamella Pert, MD, PhD 04/01/2023, 11:07 AM

## 2023-04-01 NOTE — ED Provider Notes (Signed)
[place] EMERGENCY DEPARTMENT AT [facility] Provider Note   CSN: [id number] Arrival date & time: 04/01/23  1712     History  Chief Complaint  Patient presents with   Shortness of Breath    Mario [name] is a 73 y.o. male.  73 year old male with prior medical history as detailed below presents for evaluation.  Patient was discharged this afternoon from this facility.  Patient reports that he was admitted for treatment of suspected pneumonia.  On arrival to his residence the patient reports that his dyspnea increased with exertion.  Patient reports that he felt very uncomfortable despite continued use of home O2.  He called EMS for transport back to the hospital.  On arrival to the ED he denies chest pain, fever.  He reports that he typically wears approximately 5 L nasal cannula O2.  On arrival he appears to be comfortable on 6 L nasal cannula O2.  The history is provided by the patient and medical records.       Home Medications Prior to Admission medications   Medication Sig Start Date End Date Taking? Authorizing Provider  acetaminophen (TYLENOL) 500 MG tablet Take 1,000 mg by mouth every 6 (six) hours as needed for mild pain (pain score 1-3) or moderate pain (pain score 4-6).    [provider]  albuterol (PROVENTIL) (2.5 MG/3ML) 0.083% nebulizer solution Inhale 3 mLs (2.5 mg total) by nebulization every 4 (four) hours as needed for wheezing or shortness of breath. Patient not taking: Reported on 03/17/2023 08/04/20 03/17/23  Rolly Salter, MD  albuterol (VENTOLIN HFA) 108 (90 Base) MCG/ACT inhaler Inhale 2 puffs into the lungs every 4 (four) hours as needed for wheezing or shortness of breath. 11/04/21   Glade Lloyd, MD  apixaban (ELIQUIS) 5 MG TABS tablet Take 5 mg by mouth 2 (two) times daily.    [provider]  ascorbic acid (VITAMIN C) 500 MG tablet Take 500 mg by mouth daily.    [provider]  aspirin EC 81 MG tablet Take 81  mg by mouth daily. Swallow whole.    [provider]  azelastine (ASTELIN) 0.1 % nasal spray Place 1 spray into both nostrils 2 (two) times daily. Use in each nostril as directed    [provider]  bisacodyl (DULCOLAX) 5 MG EC tablet Take 2 tablets (10 mg total) by mouth daily as needed for moderate constipation. 01/26/23   Arby Barrette, MD  BLACK CURRANT SEED OIL PO Take 2 each by mouth daily.    [provider]  buPROPion (WELLBUTRIN XL) 300 MG 24 hr tablet Take 300 mg by mouth every morning. 02/05/20   [provider]  busPIRone (BUSPAR) 15 MG tablet Take 15 mg by mouth in the morning.    [provider]  Calcium Carbonate (CALCIUM 500 PO) Take 1 tablet by mouth daily.    [provider]  Carboxymethylcellulose Sod PF 0.5 % SOLN Place 1 drop into both eyes 4 (four) times daily as needed (dry eyes).    [provider]  cholecalciferol (VITAMIN D3) 25 MCG (1000 UNIT) tablet Take 1,000 Units by mouth daily.    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 Patient not taking: Reported on 03/17/2023    [provider]  cyclobenzaprine (FLEXERIL) 10 MG tablet Take 10 mg by mouth at bedtime.    [provider]  cycloSPORINE (RESTASIS) 0.05 % ophthalmic emulsion Place 1 drop into both eyes every 12 (twelve) hours.    [provider]  diltiazem (CARDIZEM CD) 180 MG 24 hr capsule Take 180 mg by mouth in the morning.    [provider]  docusate sodium (COLACE) 100 MG capsule Take 200 mg by mouth 2 (two) times daily.    [provider]  DULoxetine (CYMBALTA) 60 MG capsule Take 60 mg by mouth 2 (two) times daily.    [provider]  fluticasone (FLONASE) 50 MCG/ACT nasal spray Place 2 sprays into both nostrils daily.     [provider]  folic acid (FOLVITE) 1 MG tablet  Take 1 mg by mouth daily.    [provider]  gabapentin (NEURONTIN) 100 MG capsule Take 100 mg by mouth 3 (three) times daily.    [provider]  GARLIC PO Take 1 tablet by mouth daily.    [provider]  KRILL OIL PO Take 2 capsules by mouth daily.    [provider]  LACTULOSE PO Take 2 Scoops by mouth 2 (two) times daily as needed. 2 tablespoonfuls    [provider]  lidocaine (XYLOCAINE) 5 % ointment Apply 1 Application topically 2 (two) times daily as needed.    [provider]  MAGNESIUM OXIDE PO Take 1 tablet by mouth at bedtime.    [provider]  Melatonin 3 MG CAPS Take 3 mg by mouth at bedtime.    [provider]  metFORMIN (GLUCOPHAGE) 850 MG tablet Take 850 mg by mouth every evening.    [provider]  mometasone [facility]) 220 MCG/ACT inhaler Inhale 2 puffs into the lungs daily as needed. Patient not taking: Reported on 03/17/2023    [provider]  Multiple Vitamin (MULTIVITAMIN ADULT PO) Take 1 tablet by mouth daily.    [provider]  Nutritional Supplements (ENSURE ORIGINAL) LIQD Take 1 Bottle by mouth daily as needed (in between meals).    [provider]  omeprazole (PRILOSEC) 20 MG capsule Take 1 capsule (20 mg total) by mouth daily. 01/26/23   Arby Barrette, MD  Oxcarbazepine (TRILEPTAL) 300 MG tablet Take 600 mg by mouth in the morning, at noon, and at bedtime.    [provider]  polyethylene glycol powder (GLYCOLAX/MIRALAX) 17 GM/SCOOP powder Take 17 g by mouth daily.    [provider]  predniSONE (DELTASONE) 10 MG tablet Take 1 tablet (10 mg total) by mouth daily with breakfast for 2 days. 04/02/23 04/04/23  Leatha Gilding, MD  psyllium (HYDROCIL/METAMUCIL) 95 % PACK Take 1 packet by mouth daily. 04/02/23   Leatha Gilding, MD  tamsulosin (FLOMAX) 0.4 MG CAPS capsule Take 0.8 mg by mouth daily.    [provider]  Tiotropium  Bromide-Olodaterol 2.5-2.5 MCG/ACT AERS Inhale 2 each into the lungs every morning. 2 puffs    [provider]  traZODone (DESYREL) 50 MG tablet Take 75 mg by mouth at bedtime.    [provider]  vitamin B-12 (CYANOCOBALAMIN) 100 MCG tablet Take 100 mcg by mouth daily.    [provider]      Allergies    Demerol [meperidine], Zocor [simvastatin], Beet [beta vulgaris], and Liver    Review of Systems   Review of Systems  All other systems reviewed and are negative.   Physical Exam Updated Vital Signs BP (!) 144/84   Pulse (!) 101   Temp 98.4 F (36.9 C) (Oral)  Resp 20   Ht 6\' 2"  (1.88 m)   Wt (!) 141 kg   SpO2 93%   BMI 39.91 kg/m  Physical Exam Vitals and nursing note reviewed.  Constitutional:      General: He is not in acute distress.    Appearance: Normal appearance. He is well-developed.  HENT:     Head: Normocephalic and atraumatic.  Eyes:     Conjunctiva/sclera: Conjunctivae normal.     Pupils: Pupils are equal, round, and reactive to light.  Cardiovascular:     Rate and Rhythm: Normal rate and regular rhythm.     Heart sounds: Normal heart sounds.  Pulmonary:     Effort: Pulmonary effort is normal. No respiratory distress.     Comments: Mildly decreased breath sounds at bilateral bases Abdominal:     General: There is no distension.     Palpations: Abdomen is soft.     Tenderness: There is no abdominal tenderness.  Musculoskeletal:        General: No deformity. Normal range of motion.     Cervical back: Normal range of motion and neck supple.  Skin:    General: Skin is warm and dry.  Neurological:     General: No focal deficit present.     Mental Status: He is alert and oriented to person, place, and time.     ED Results / Procedures / Treatments   Labs (all labs ordered are listed, but only abnormal results are displayed) Labs Reviewed  CBC WITH DIFFERENTIAL/PLATELET - Abnormal; Notable for the following components:       Result Value   WBC 13.7 (*)    Neutro Abs 10.4 (*)    Monocytes Absolute 1.1 (*)    All other components within normal limits  COMPREHENSIVE METABOLIC PANEL - Abnormal; Notable for the following components:   Sodium 130 (*)    Chloride 96 (*)    Glucose, Bld 156 (*)    Calcium 8.6 (*)    All other components within normal limits  BLOOD GAS, VENOUS - Abnormal; Notable for the following components:   pH, Ven 7.47 (*)    pCO2, Ven 42 (*)    pO2, Ven 50 (*)    Bicarbonate 30.6 (*)    Acid-Base Excess 6.2 (*)    All other components within normal limits  BRAIN NATRIURETIC PEPTIDE  TROPONIN I (HIGH SENSITIVITY)  TROPONIN I (HIGH SENSITIVITY)    EKG EKG Interpretation Date/Time:  Saturday April 01 2023 18:12:39 EST Ventricular Rate:  104 PR Interval:  210 QRS Duration:  104 QT Interval:  332 QTC Calculation: 437 R Axis:   -23  Text Interpretation: Sinus tachycardia Borderline prolonged PR interval Borderline left axis deviation Confirmed by Kristine Royal [phone]) on 04/01/2023 6:24:57 PM  Radiology DG Chest Port 1 View Result Date: 04/01/2023 CLINICAL DATA:  Shortness of breath EXAM: PORTABLE CHEST 1 VIEW COMPARISON:  03/26/2023, CT and chest x-ray 03/17/2023, 10/31/2022, 09/21/2022, CT 07/26/2020 FINDINGS: Emphysema. Small right-sided pleural effusion. Irregular and bandlike opacity in the right lower lung without significant change. Chronic pleural and consolidative process at the left base, in addition to left interstitial and ground-glass opacity. Stable cardiomediastinal silhouette. No pneumothorax IMPRESSION: Little change in radiographic appearance of the chest with small right effusion and irregular and bandlike right lower lung opacity. Chronic pleural and consolidative process at left base, some of which may correspond to the history of lung cancer. Electronically Signed   By: Jasmine Pang M.D.   On:  04/01/2023 18:24    Procedures Procedures    Medications Ordered in  ED Medications - No data to display  ED Course/ Medical Decision Making/ A&P                                 Medical Decision Making Amount and/or Complexity of Data Reviewed Labs: ordered. Radiology: ordered.    Medical Screen Complete  This patient presented to the ED with complaint of dyspnea.  This complaint involves an extensive number of treatment options. The initial differential diagnosis includes, but is not limited to, COPD, pneumonia, CHF, metabolic abnormality, etc.  This presentation is: Acute, Chronic, Self-Limited, Previously Undiagnosed, Uncertain Prognosis, Complicated, Systemic Symptoms, and Threat to Life/Bodily Function  Patient was discharged earlier today from this facility after admission for treatment of pneumonia.  Patient reports that on arrival to his residence he felt significant dyspnea associated with exertion.  He felt uncomfortable and called EMS for return to the ED.  On arrival he appears to be comfortable on 6 L nasal cannula.  Baseline O2 requirement is 5 L nasal cannula.  Hospitalist service made aware of case and will evaluate for re-admission.  Additional history obtained:  Additional history obtained from EMS External records from outside sources obtained and reviewed including prior ED visits and prior Inpatient records.    Lab Tests:  I ordered and personally interpreted labs.  The pertinent results include: CBC, CMP, VBG, troponin, BNP   Imaging Studies ordered:  I ordered imaging studies including chest x-ray I independently visualized and interpreted obtained imaging which showed no acute change I agree with the radiologist interpretation.   Cardiac Monitoring:  The patient was maintained on a cardiac monitor.  I personally viewed and interpreted the cardiac monitor which showed an underlying rhythm of: NSR  Problem List / ED Course:  Dyspnea   Reevaluation:  After the interventions noted above, I reevaluated the  patient and found that they have: stayed the same   Disposition:  After consideration of the diagnostic results and the patients response to treatment, I feel that the patent would benefit from likely readmission.          Final Clinical Impression(s) / ED Diagnoses Final diagnoses:  Dyspnea, unspecified type    Rx / DC Orders ED Discharge Orders     None         Wynetta Fines, MD 04/01/23 [phone]

## 2023-04-01 NOTE — Discharge Instructions (Signed)
Follow with [facility] in 5-7 days  Please get a complete blood count and chemistry panel checked by your Primary MD at your next visit, and again as instructed by your Primary MD. Please get your medications reviewed and adjusted by your Primary MD.  Please request your Primary MD to go over all Hospital Tests and Procedure/Radiological results at the follow up, please get all Hospital records sent to your Prim MD by signing hospital release before you go home.  In some cases, there will be blood work, cultures and biopsy results pending at the time of your discharge. Please request that your primary care M.D. goes through all the records of your hospital data and follows up on these results.  If you had Pneumonia of Lung problems at the Hospital: Please get a 2 view Chest X ray done in 6-8 weeks after hospital discharge or sooner if instructed by your Primary MD.  If you have Congestive Heart Failure: Please call your Cardiologist or Primary MD anytime you have any of the following symptoms:  1) 3 pound weight gain in 24 hours or 5 pounds in 1 week  2) shortness of breath, with or without a dry hacking cough  3) swelling in the hands, feet or stomach  4) if you have to sleep on extra pillows at night in order to breathe  Follow cardiac low salt diet and 1.5 lit/day fluid restriction.  If you have diabetes Accuchecks 4 times/day, Once in AM empty stomach and then before each meal. Log in all results and show them to your primary doctor at your next visit. If any glucose reading is under 80 or above 300 call your primary MD immediately.  If you have Seizure/Convulsions/Epilepsy: Please do not drive, operate heavy machinery, participate in activities at heights or participate in high speed sports until you have seen by Primary MD or a Neurologist and advised to do so again. Per [facility] statutes, patients with seizures are not allowed to drive until they have been  seizure-free for six months.  Use caution when using heavy equipment or power tools. Avoid working on ladders or at heights. Take showers instead of baths. Ensure the water temperature is not too high on the home water heater. Do not go swimming alone. Do not lock yourself in a room alone (i.e. bathroom). When caring for infants or small children, sit down when holding, feeding, or changing them to minimize risk of injury to the child in the event you have a seizure. Maintain good sleep hygiene. Avoid alcohol.   If you had Gastrointestinal Bleeding: Please ask your Primary MD to check a complete blood count within one week of discharge or at your next visit. Your endoscopic/colonoscopic biopsies that are pending at the time of discharge, will also need to followed by your Primary MD.  Get Medicines reviewed and adjusted. Please take all your medications with you for your next visit with your Primary MD  Please request your Primary MD to go over all hospital tests and procedure/radiological results at the follow up, please ask your Primary MD to get all Hospital records sent to his/her office.  If you experience worsening of your admission symptoms, develop shortness of breath, life threatening emergency, suicidal or homicidal thoughts you must seek medical attention immediately by calling 911 or calling your MD immediately  if symptoms less severe.  You must read complete instructions/literature along with all the possible adverse reactions/side effects for all the Medicines you take  and that have been prescribed to you. Take any new Medicines after you have completely understood and accpet all the possible adverse reactions/side effects.   Do not drive or operate heavy machinery when taking Pain medications.   Do not take more than prescribed Pain, Sleep and Anxiety Medications  Special Instructions: If you have smoked or chewed Tobacco  in the last 2 yrs please stop smoking, stop any regular  Alcohol  and or any Recreational drug use.  Wear Seat belts while driving.  Please note You were cared for by a hospitalist during your hospital stay. If you have any questions about your discharge medications or the care you received while you were in the hospital after you are discharged, you can call the unit and asked to speak with the hospitalist on call if the hospitalist that took care of you is not available. Once you are discharged, your primary care physician will handle any further medical issues. Please note that NO REFILLS for any discharge medications will be authorized once you are discharged, as it is imperative that you return to your primary care physician (or establish a relationship with a primary care physician if you do not have one) for your aftercare needs so that they can reassess your need for medications and monitor your lab values.  You can reach the hospitalist office at phone [phone] or fax [phone]   If you do not have a primary care physician, you can call [phone] for a physician referral.  Activity: As tolerated with Full fall precautions use walker/cane & assistance as needed    Diet: heart healthy  Disposition Home

## 2023-04-01 NOTE — Plan of Care (Signed)

## 2023-04-01 NOTE — Hospital Course (Addendum)
72 yom w/ COPD, chronic hypoxic respiratory failure on 5 L O2 [place], history of PE on Eliquis, history of left lung cancer (s/p radiation), T2DM, HTN, BPH, GAD admitted on 1/17-2/1 for hypoxic respiratory failure/pneumonia/RSV infection and COPD exacerbation presented in the same day of discharge with persistent dyspnea in setting of COPD exacerbation, CAP, RSV infection. In the ED chest x-ray revealed changing x-ray finding of small right effusion, chronic pleural and consolidative process in the left base.  Was hemodynamically stable.  Patient complained of ongoing shortness of breath cough and hemoptysis-Eliquis was discontinued seen by pulmonary. Due to persistent hemoptysis 2/6-CT chest with contrast done>shows stable large airspace opacity left lower lobe, lingular consistent with pneumonia or possibly fibrosis from prior radiation treatment, small loculated fluid collection seen laterally lung base, right lower lobe opacity slightly improved. Per pulmonary Limited endobronchial options given his high oxygen requirement at 5 L [place] no good endobronchial options for diffuse disease either.Patient continued to have cough along with hemoptysis-being managed with Tranexamic acid nebs. Empirically started on doxycycline, diuretics IS.

## 2023-04-01 NOTE — Progress Notes (Signed)
Mobility Specialist - Progress Note   Pre-mobility: 97 bpm HR, 96% SpO2 (5L [place]) During mobility: 104 bpm HR, 95% SpO2 (6L [place]) Post-mobility: 94 bpm HR, 97% SPO2 (5L [place])     04/01/23 0923  Mobility  Activity Ambulated with assistance in hallway  Level of Assistance Independent  Assistive Device None  Distance Ambulated (ft) 100 ft  Range of Motion/Exercises Active  Activity Response Tolerated well  Mobility Referral Yes  Mobility visit 1 Mobility  Mobility Specialist Start Time (ACUTE ONLY) 0915  Mobility Specialist Stop Time (ACUTE ONLY) [id number]  Mobility Specialist Time Calculation (min) (ACUTE ONLY) 8 min   Pt was found in bed and agreeable to ambulate. No complaints with session. At EOS returned to bed with all needs met. Call bell in reach.  Billey Chang Mobility Specialist

## 2023-04-02 DIAGNOSIS — J209 Acute bronchitis, unspecified: Secondary | ICD-10-CM | POA: Diagnosis not present

## 2023-04-02 DIAGNOSIS — J440 Chronic obstructive pulmonary disease with acute lower respiratory infection: Secondary | ICD-10-CM | POA: Diagnosis not present

## 2023-04-02 LAB — CBC
HCT: 42.7 % (ref 39.0–52.0)
Hemoglobin: 14.4 g/dL (ref 13.0–17.0)
MCH: 30.9 pg (ref 26.0–34.0)
MCHC: 33.7 g/dL (ref 30.0–36.0)
MCV: 91.6 fL (ref 80.0–100.0)
Platelets: 314 10*3/uL (ref 150–400)
RBC: 4.66 MIL/uL (ref 4.22–5.81)
RDW: 13.4 % (ref 11.5–15.5)
WBC: 10.9 10*3/uL — ABNORMAL HIGH (ref 4.0–10.5)
nRBC: 0 % (ref 0.0–0.2)

## 2023-04-02 LAB — BASIC METABOLIC PANEL
Anion gap: 11 (ref 5–15)
BUN: 15 mg/dL (ref 8–23)
CO2: 25 mmol/L (ref 22–32)
Calcium: 8.8 mg/dL — ABNORMAL LOW (ref 8.9–10.3)
Chloride: 99 mmol/L (ref 98–111)
Creatinine, Ser: 0.6 mg/dL — ABNORMAL LOW (ref 0.61–1.24)
GFR, Estimated: >60 mL/min (ref >60)
Glucose, Bld: 184 mg/dL — ABNORMAL HIGH (ref 70–99)
Potassium: 4.8 mmol/L (ref 3.5–5.1)
Sodium: 135 mmol/L (ref 135–145)

## 2023-04-02 LAB — GLUCOSE, CAPILLARY
Glucose-Capillary: 128 mg/dL — ABNORMAL HIGH (ref 70–99)
Glucose-Capillary: 208 mg/dL — ABNORMAL HIGH (ref 70–99)
Glucose-Capillary: 200 mg/dL — ABNORMAL HIGH (ref 70–99)

## 2023-04-02 LAB — CBG MONITORING, ED: Glucose-Capillary: 195 mg/dL — ABNORMAL HIGH (ref 70–99)

## 2023-04-02 MED ORDER — CYCLOBENZAPRINE HCL 5 MG PO TABS
7.5000 mg | ORAL_TABLET | Freq: Once | ORAL | Status: AC
  Administered 2023-04-02: 7.5 mg via ORAL
  Filled 2023-04-02: qty 2

## 2023-04-02 NOTE — ED Notes (Signed)
Pt alert, NAD, calm, interactive, cooperative, updated.

## 2023-04-02 NOTE — Progress Notes (Signed)
PROGRESS NOTE    Mario [name]  ZOX:[id number] DOB: 02/25/1951 DOA: 04/01/2023 PCP: [name]   Brief Narrative:  73 y.o. male with medical history significant for COPD, chronic hypoxic respiratory failure on 5 L O2 [place], history of PE on Eliquis, history of left lung cancer (s/p radiation), T2DM, HTN, BPH, GAD who presents to the ED for evaluation of shortness of breath.  Patient was admitted to the hospital here on 03/17/2023 and discharged 01/29/2024.  He was readmitted on the same day.  Patient was admitted on the 17th for community-acquired pneumonia and RSV with COPD exacerbation received steroids and antibiotics and was discharged home in a stable condition.  Workup at that time included a transthoracic echo with ejection fraction 60 to 65%.  Echo showed ejection fraction 60 to 65% no regional wall motion abnormalities and grade 1 diastolic dysfunction.  He was discharged on 5 L of nasal cannula and 6 L while ambulating along with prednisone.  He comes back in as he was having a difficult time climbing stairs.  He has 3 levels at his house.  Portable chest x-ray with little change in radiographic appearance of the chest with small right effusion and irregular and bandlike right lower lung opacity.  Chronic pleural and consolidative process in the left base, some of which may correspond to the history of lung cancer.  Assessment & Plan:   Principal Problem:   COPD with acute bronchitis (HCC) Active Problems:   Chronic respiratory failure with hypoxia, on home O2 therapy (HCC) - 4 L/min   DM2 (diabetes mellitus, type 2) (HCC)   History of pulmonary embolus (PE)   BPH (benign prostatic hyperplasia)   OSA (obstructive sleep apnea)   Hypertension associated with diabetes (HCC)   COPD with chronic hypoxic respiratory failure on 5 L [place] at baseline: Patient with same-day readmission for persistent exertional dyspnea.  He was admitted for 2 weeks for management of acute on chronic  hypoxic respiratory failure due to COPD exacerbation, CAP, and RSV infection.  He completed antibiotics and steroids course.  Patient became very dyspneic at home after walking up a flight of stairs.  CXR is relatively unchanged from prior imaging.  Persistent dyspnea and cough likely related to bronchitis as well as complicated by obesity and deconditioning. -Continue Brovana/Pulmicort, DuoNebs as needed Received Solu-Medrol in the ED currently on prednisone continue. -PT/OT eval pending.   Recent admit for community-acquired pneumonia/RSV: He has completed a full course of antibiotics.  CXR unchanged from prior imaging.  No indication for further antibiotics at this time.   Hyponatremia:resolved   History of PE: Continue Eliquis.   Type 2 diabetes: Continue SSI.  Holding metformin.   Hypertension: Continue diltiazem 180 mg daily.   BPH: Continue Flomax.   GAD/insomnia: Continue bupropion, BuSpar, Cymbalta, Trileptal, trazodone.   OSA: CPAP nightly.  Estimated body mass index is 39.91 kg/m as calculated from the following:   Height as of this encounter: 6\' 2"  (1.88 m).   Weight as of this encounter: 141 kg.  DVT prophylaxis:   (ELIQUIS) tablet 5 mg   Code Status: Full code Family Communication: none Disposition Plan: From home, dispo pending clinical progress Consults called: None Procedures: none Antimicrobials:none  Subjective: Does not want to go to rehab and does not want to go home He reports his house is 3 levels and has difficulty climbing the stairs  Objective: Vitals:   04/02/23 0730 04/02/23 0800 04/02/23 0830 04/02/23 0929  BP: (!) 143/117 [name])  152/86 (!) 160/99 (!) 159/86  Pulse: 96 95 95 97  Resp:    18  Temp:    97.8 F (36.6 C)  TempSrc:    Oral  SpO2: 93% 93% 92% 94%  Weight:      Height:        Intake/Output Summary (Last 24 hours) at 04/02/2023 1505 Last data filed at 04/02/2023 1048 Gross per 24 hour  Intake 240 ml  Output 300 ml  Net  -60 ml   Filed Weights   04/01/23 1721  Weight: (!) 141 kg    Examination:  General exam: Appears in nad Respiratory system: Clear to auscultation. Respiratory effort normal. Cardiovascular system: S1 & S2 heard, RRR. No JVD, murmurs, rubs, gallops or clicks. No pedal edema. Gastrointestinal system: Abdomen is nondistended, soft and nontender. No organomegaly or masses felt. Normal bowel sounds heard. Central nervous system: Alert and oriented. No focal neurological deficits. Extremities: trace edema    Data Reviewed: I have personally reviewed following labs and imaging studies  CBC: Recent Labs  Lab 03/28/23 0532 03/30/23 0525 04/01/23 0604 04/01/23 1840 04/02/23 0924  WBC 13.6* 12.6* 12.4* 13.7* 10.9*  NEUTROABS 8.9*  --   --  10.4*  --   HGB 13.2 13.1 12.9* 14.0 14.4  HCT 40.2 39.5 40.2 42.0 42.7  MCV 93.1 92.7 95.3 91.5 91.6  PLT 273 251 260 292 314   Basic Metabolic Panel: Recent Labs  Lab 03/27/23 1046 03/28/23 0532 03/30/23 0525 03/31/23 0452 04/01/23 0604 04/01/23 1840 04/02/23 0924  NA 135 133* 132* 129* 132* 130* 135  K 4.7 4.4 3.9 3.6 4.1 4.2 4.8  CL 94* 96* 98 94* 98 96* 99  CO2 32 29 26 26 26 25 25   GLUCOSE 137* 167* 157* 138* 133* 156* 184*  BUN 17 23 16 18 19 18 15   CREATININE 0.88 1.05 0.75 0.92 0.90 0.75 0.60*  CALCIUM 9.1 8.4* 8.2* 8.5* 8.3* 8.6* 8.8*  MG 2.3 2.3 2.3 2.4 2.2  --   --   PHOS 3.4 3.8  --  3.7  --   --   --    GFR: Estimated Creatinine Clearance: 126.6 mL/min (A) (by C-G formula based on SCr of 0.6 mg/dL (L)). Liver Function Tests: Recent Labs  Lab 03/28/23 0532 03/30/23 0525 04/01/23 0604 04/01/23 1840  AST 15 15 15 18   ALT 22 19 17 18   ALKPHOS 62 58 58 65  BILITOT 0.5 0.5 0.6 0.6  PROT 6.4* 6.2* 6.1* 6.9  ALBUMIN 3.2* 3.2* 3.0* 3.7   No results for input(s): "LIPASE", "AMYLASE" in the last 168 hours. No results for input(s): "AMMONIA" in the last 168 hours. Coagulation Profile: No results for input(s): "INR",  "PROTIME" in the last 168 hours. Cardiac Enzymes: No results for input(s): "CKTOTAL", "CKMB", "CKMBINDEX", "TROPONINI" in the last 168 hours. BNP (last 3 results) No results for input(s): "PROBNP" in the last 8760 hours. HbA1C: No results for input(s): "HGBA1C" in the last 72 hours. CBG: Recent Labs  Lab 04/01/23 0734 04/01/23 1147 04/01/23 2216 04/02/23 0755 04/02/23 1233  GLUCAP 113* 201* 112* 195* 128*   Lipid Profile: No results for input(s): "CHOL", "HDL", "LDLCALC", "TRIG", "CHOLHDL", "LDLDIRECT" in the last 72 hours. Thyroid Function Tests: No results for input(s): "TSH", "T4TOTAL", "FREET4", "T3FREE", "THYROIDAB" in the last 72 hours. Anemia Panel: No results for input(s): "VITAMINB12", "FOLATE", "FERRITIN", "TIBC", "IRON", "RETICCTPCT" in the last 72 hours. Sepsis Labs: No results for input(s): "PROCALCITON", "LATICACIDVEN" in the last 168 hours.  Recent Results (from the past 240 hours)  Expectorated Sputum Assessment w Gram Stain, Rflx to Resp Cult     Status: None   Collection Time: 03/24/23  4:56 PM   Specimen: Expectorated Sputum  Result Value Ref Range Status   Specimen Description EXPECTORATED SPUTUM  Final   Special Requests NONE  Final   Sputum evaluation   Final    THIS SPECIMEN IS ACCEPTABLE FOR SPUTUM CULTURE Performed at [facility], [address] [place], [place] [address]    Report Status 03/24/2023 FINAL  Final  Culture, Respiratory w Gram Stain     Status: None   Collection Time: 03/24/23  4:56 PM  Result Value Ref Range Status   Specimen Description   Final    EXPECTORATED SPUTUM Performed at [facility], [address] [place], [place] [address]    Special Requests   Final    NONE Reflexed from [phone] Performed at [facility], [address], [place], [place] [address]    Gram Stain   Final    FEW WBC PRESENT, PREDOMINANTLY PMN RARE GRAM POSITIVE COCCI RARE BUDDING YEAST  SEEN    Culture   Final    MODERATE Normal respiratory flora-no Staph aureus or Pseudomonas seen Performed at [facility], [address], [place], [place] [address]    Report Status 03/27/2023 FINAL  Final         Radiology Studies: DG Chest Port 1 View Result Date: 04/01/2023 CLINICAL DATA:  Shortness of breath EXAM: PORTABLE CHEST 1 VIEW COMPARISON:  03/26/2023, CT and chest x-ray 03/17/2023, 10/31/2022, 09/21/2022, CT 07/26/2020 FINDINGS: Emphysema. Small right-sided pleural effusion. Irregular and bandlike opacity in the right lower lung without significant change. Chronic pleural and consolidative process at the left base, in addition to left interstitial and ground-glass opacity. Stable cardiomediastinal silhouette. No pneumothorax IMPRESSION: Little change in radiographic appearance of the chest with small right effusion and irregular and bandlike right lower lung opacity. Chronic pleural and consolidative process at left base, some of which may correspond to the history of lung cancer. Electronically Signed   By: Jasmine Pang M.D.   On: 04/01/2023 18:24   Scheduled Meds:  apixaban  5 mg Oral BID   arformoterol  15 mcg Nebulization BID   aspirin EC  81 mg Oral Daily   budesonide (PULMICORT) nebulizer solution  0.25 mg Nebulization BID   buPROPion  300 mg Oral q morning   busPIRone  15 mg Oral Daily   diltiazem  180 mg Oral Daily   docusate sodium  200 mg Oral BID   DULoxetine  60 mg Oral BID   gabapentin  100 mg Oral TID   guaiFENesin  1,200 mg Oral BID   insulin aspart  0-5 Units Subcutaneous QHS   insulin aspart  0-9 Units Subcutaneous TID WC   melatonin  3 mg Oral QHS   Oxcarbazepine  600 mg Oral TID   pantoprazole  40 mg Oral Daily   predniSONE  10 mg Oral Q breakfast   tamsulosin  0.8 mg Oral Daily   traZODone  75 mg Oral QHS   Continuous Infusions:   LOS: 0 days    Time spent: 37 min  Alwyn Ren, MD 04/02/2023, 3:05 PM

## 2023-04-02 NOTE — Progress Notes (Signed)
Pt coughs up bloody sputum/phlegm noted. Notified MD on call. Will monitor pt.

## 2023-04-02 NOTE — ED Notes (Signed)
Nebs/ inhalers not given, RT to give, not available.

## 2023-04-02 NOTE — Progress Notes (Signed)
   04/02/23 2139  BiPAP/CPAP/SIPAP  BiPAP/CPAP/SIPAP Pt Type Adult  Reason BIPAP/CPAP not in use Non-compliant

## 2023-04-02 NOTE — Evaluation (Signed)
Physical Therapy Evaluation Patient Details Name: Mario [name] MRN: [id number] DOB: 09/21/1950 Today's Date: 04/02/2023  History of Present Illness  73 y.o. male presents to the ED for evaluation of shortness of breath. pt was discharged earlier same day after being admitted initially on 03/17/2023 for acute on chronic hypoxic respiratory failure in the setting of community-acquired pneumonia, RSV infection, and COPD exacerbation.    medical history significant for COPD, chronic hypoxic respiratory failure on 5 L O2 [place], history of PE on Eliquis, history of left lung cancer (s/p radiation), T2DM, HTN, BPH, GAD  Clinical Impression  Pt admitted with above diagnosis.  Pt pleasant and cooperative with PT; fatigues rapidly with minimal activity. SpO2=88%  after 25', returned to 90% with pursed lip breathing. pt able to continue 25' back to to room; HR 99-115 max Pt concerned about the 5 steps up to bedroom at home (pt lives in room but goes up/down 5 steps more than once per day to common area of home which is shared others) Will follow in acute setting for activity tol, stair training.  No f/u PT indicated at this time. Pt has DME and is aware of EC techniques   Pt currently with functional limitations due to the deficits listed below (see PT Problem List). Pt will benefit from acute skilled PT to increase their independence and safety with mobility to allow discharge.           If plan is discharge home, recommend the following: Help with stairs or ramp for entrance   Can travel by private vehicle        Equipment Recommendations None recommended by PT  Recommendations for Other Services       Functional Status Assessment Patient has had a recent decline in their functional status and demonstrates the ability to make significant improvements in function in a reasonable and predictable amount of time.     Precautions / Restrictions Precautions Precaution Comments: monitor O2/5L at  baseline Restrictions Weight Bearing Restrictions Per Provider Order: No      Mobility  Bed Mobility Overal bed mobility: Modified Independent                  Transfers Overall transfer level: Needs assistance Equipment used: None Transfers: Sit to/from Stand Sit to Stand: Supervision           General transfer comment: for safety    Ambulation/Gait Ambulation/Gait assistance: Supervision Gait Distance (Feet): 50 Feet Assistive device: None Gait Pattern/deviations: Step-through pattern       General Gait Details: therapeutic standing rest with wall support after 25' d/t 4/4 DOE. SpO2=88%  after 25', returned to 90% with pursed lip breathing. pt able to continue 25' back to to room; HR 99-115 max  Stairs            Wheelchair Mobility     Tilt Bed    Modified Rankin (Stroke Patients Only)       Balance Overall balance assessment: Mild deficits observed, not formally tested                                           Pertinent Vitals/Pain Pain Assessment Pain Assessment: No/denies pain    Home Living Family/patient expects to be discharged to:: Private residence Living Arrangements: Non-relatives/Friends   Type of Home: House Home Access: Level entry     Alternate Level  Stairs-Number of Steps: 5 Home Layout: Two level Home Equipment: Cane - single point;Shower Counsellor (2 wheels);Rollator (4 wheels);Other (comment);Hand held shower head Additional Comments: pt reports he rents a room in a home with a few other roommates; goes up/down 5 steps to kitchen/living room area. bedroom on 2nd level of 5 steps. wears 5L O2 at baseline. Pt has a pulse oximeter at home    Prior Function Prior Level of Function : Independent/Modified Independent             Mobility Comments: "I don't walk much". When he walks he does not use an AD. ADLs Comments: senior living companion assists with laundry, meals. cleaning      Extremity/Trunk Assessment   Upper Extremity Assessment Upper Extremity Assessment: Overall WFL for tasks assessed    Lower Extremity Assessment Lower Extremity Assessment: Overall WFL for tasks assessed       Communication   Communication Communication: No apparent difficulties  Cognition Arousal: Alert Behavior During Therapy: WFL for tasks assessed/performed Overall Cognitive Status: Within Functional Limits for tasks assessed                                          General Comments      Exercises     Assessment/Plan    PT Assessment Patient needs continued PT services  PT Problem List Cardiopulmonary status limiting activity;Decreased activity tolerance;Decreased mobility       PT Treatment Interventions Gait training;Functional mobility training;Therapeutic activities;Patient/family education;Stair training    PT Goals (Current goals can be found in the Care Plan section)  Acute Rehab PT Goals PT Goal Formulation: With patient Time For Goal Achievement: 04/16/23 Potential to Achieve Goals: Good    Frequency Min 1X/week     Co-evaluation               AM-PAC PT "6 Clicks" Mobility  Outcome Measure Help needed turning from your back to your side while in a flat bed without using bedrails?: None Help needed moving from lying on your back to sitting on the side of a flat bed without using bedrails?: None Help needed moving to and from a bed to a chair (including a wheelchair)?: A Little Help needed standing up from a chair using your arms (e.g., wheelchair or bedside chair)?: A Little Help needed to walk in hospital room?: A Little Help needed climbing 3-5 steps with a railing? : A Little 6 Click Score: 20    End of Session Equipment Utilized During Treatment: Gait belt Activity Tolerance: Patient tolerated treatment well Patient left: with call bell/phone within reach;in bed   PT Visit Diagnosis: Other abnormalities of gait  and mobility (R26.89)    Time: 1431-1450 PT Time Calculation (min) (ACUTE ONLY): 19 min   Charges:   PT Evaluation $PT Eval Low Complexity: 1 Low   PT General Charges $$ ACUTE PT VISIT: 1 Visit         Paxton Binns, PT  Acute Rehab Dept ([facility]) [phone]  04/02/2023   [facility] 04/02/2023, 3:01 PM

## 2023-04-03 DIAGNOSIS — J440 Chronic obstructive pulmonary disease with acute lower respiratory infection: Secondary | ICD-10-CM | POA: Diagnosis not present

## 2023-04-03 DIAGNOSIS — J209 Acute bronchitis, unspecified: Secondary | ICD-10-CM

## 2023-04-03 DIAGNOSIS — R042 Hemoptysis: Secondary | ICD-10-CM

## 2023-04-03 LAB — GLUCOSE, CAPILLARY
Glucose-Capillary: 158 mg/dL — ABNORMAL HIGH (ref 70–99)
Glucose-Capillary: 119 mg/dL — ABNORMAL HIGH (ref 70–99)
Glucose-Capillary: 137 mg/dL — ABNORMAL HIGH (ref 70–99)
Glucose-Capillary: 240 mg/dL — ABNORMAL HIGH (ref 70–99)

## 2023-04-03 MED ORDER — REVEFENACIN 175 MCG/3ML IN SOLN
175.0000 ug | Freq: Every day | RESPIRATORY_TRACT | Status: DC
  Administered 2023-04-03 – 2023-04-24 (×16): 175 ug via RESPIRATORY_TRACT
  Filled 2023-04-03 (×22): qty 3

## 2023-04-03 MED ORDER — VITAMIN C 500 MG PO TABS
500.0000 mg | ORAL_TABLET | Freq: Every day | ORAL | Status: DC
  Administered 2023-04-04 – 2023-04-24 (×20): 500 mg via ORAL
  Filled 2023-04-03 (×21): qty 1

## 2023-04-03 MED ORDER — VITAMIN D 25 MCG (1000 UNIT) PO TABS
1000.0000 [IU] | ORAL_TABLET | Freq: Every day | ORAL | Status: DC
  Administered 2023-04-04 – 2023-04-24 (×20): 1000 [IU] via ORAL
  Filled 2023-04-03 (×21): qty 1

## 2023-04-03 MED ORDER — HYDROCOD POLI-CHLORPHE POLI ER 10-8 MG/5ML PO SUER
5.0000 mL | Freq: Two times a day (BID) | ORAL | Status: DC | PRN
  Administered 2023-04-03 – 2023-04-05 (×3): 5 mL via ORAL
  Filled 2023-04-03 (×3): qty 5

## 2023-04-03 MED ORDER — PREDNISONE 20 MG PO TABS
40.0000 mg | ORAL_TABLET | Freq: Every day | ORAL | Status: DC
  Administered 2023-04-03 – 2023-04-10 (×8): 40 mg via ORAL
  Filled 2023-04-03 (×8): qty 2

## 2023-04-03 MED ORDER — FLUTICASONE PROPIONATE 50 MCG/ACT NA SUSP
2.0000 | Freq: Every day | NASAL | Status: DC
  Administered 2023-04-03 – 2023-04-19 (×16): 2 via NASAL
  Filled 2023-04-03: qty 16

## 2023-04-03 MED ORDER — PANTOPRAZOLE SODIUM 40 MG PO TBEC
40.0000 mg | DELAYED_RELEASE_TABLET | Freq: Two times a day (BID) | ORAL | Status: DC
  Administered 2023-04-03 – 2023-04-24 (×41): 40 mg via ORAL
  Filled 2023-04-03 (×42): qty 1

## 2023-04-03 MED ORDER — SALINE SPRAY 0.65 % NA SOLN
2.0000 | Freq: Two times a day (BID) | NASAL | Status: DC
  Administered 2023-04-03 – 2023-04-24 (×22): 2 via NASAL
  Filled 2023-04-03: qty 44

## 2023-04-03 MED ORDER — FOLIC ACID 1 MG PO TABS
1.0000 mg | ORAL_TABLET | Freq: Every day | ORAL | Status: DC
  Administered 2023-04-03 – 2023-04-24 (×21): 1 mg via ORAL
  Filled 2023-04-03 (×22): qty 1

## 2023-04-03 MED ORDER — AZELASTINE HCL 0.1 % NA SOLN
2.0000 | Freq: Two times a day (BID) | NASAL | Status: DC
  Administered 2023-04-03 – 2023-04-24 (×32): 2 via NASAL
  Filled 2023-04-03: qty 30

## 2023-04-03 NOTE — Evaluation (Addendum)
Occupational Therapy Evaluation Patient Details Name: Mario [name] MRN: [id number] DOB: 06-23-1950 Today's Date: 04/03/2023   History of Present Illness Patient is a 73 year old male who presented on 2/1 with failed d/c home from hospital with COPD exacerbation, RSV, and CAP. Patient noted to have coughing with blood in sputum with MD aware. PMH: non-small cell lung cancer status post SBRT, malignant pleural effusion and ankylosing spondylitis, PE , OSA with CPAP intolerance, type 2 diabetes mellitus, chronic pain, depression, anxiety, and COPD, chronic hypoxic respiratory failure .   Clinical Impression   Patient is a 73 year old male who was admitted for above. Patient was living at home with PRN support for IADLs with roommate and care giver. Patient was on 5L/min at baseline at home prior to arrival. Patient was noted to have significant shortness of breath with minimal activity. Patient down to 87% on 5L/min with transfer to recliner in room with increased time to reduce SOB. Patient reported plan was to d/c home with [facility] when medically stable.  Patient would continue to benefit from skilled OT services at this time while admitted and after d/c to address noted deficits in order to improve overall safety and independence in ADLs.        If plan is discharge home, recommend the following: A little help with walking and/or transfers;A little help with bathing/dressing/bathroom;Assistance with cooking/housework;Direct supervision/assist for medications management;Direct supervision/assist for financial management;Help with stairs or ramp for entrance;Assist for transportation    Functional Status Assessment  Patient has had a recent decline in their functional status and demonstrates the ability to make significant improvements in function in a reasonable and predictable amount of time.  Equipment Recommendations  None recommended by OT       Precautions / Restrictions  Precautions Precautions: Fall Precaution Comments: monitor O2 7 HR/5L at baseline Restrictions Weight Bearing Restrictions Per Provider Order: No      Mobility Bed Mobility               General bed mobility comments: patient sitting EOB upon arrival and then transitioned to recliner at end of session.           Balance Overall balance assessment: Mild deficits observed, not formally tested             ADL either performed or assessed with clinical judgement   ADL Overall ADL's : Needs assistance/impaired Eating/Feeding: Supervision/ safety   Grooming: Supervision/safety;Sitting   Upper Body Bathing: Set up;Sitting   Lower Body Bathing: Contact guard assist;Supervison/ safety;Sitting/lateral leans Lower Body Bathing Details (indicate cue type and reason): able to figure four legs for simulated task but noted to have increasd SOB with this limiting continued participation with only one leg. Upper Body Dressing : Sitting;Supervision/safety;Set up   Lower Body Dressing: Set up;Supervision/safety;Sitting/lateral leans   Toilet Transfer: Contact guard assist;Ambulation Toilet Transfer Details (indicate cue type and reason): no AD dropped to 87% on 5L/min with transfer with increased time to breath back up with cues to slow down breathing and breath in through nose. Toileting- Clothing Manipulation and Hygiene: Sit to/from stand;Contact guard assist               Vision   Vision Assessment?: No apparent visual deficits            Pertinent Vitals/Pain Pain Assessment Pain Assessment: No/denies pain     Extremity/Trunk Assessment Upper Extremity Assessment Upper Extremity Assessment: Overall WFL for tasks assessed   Lower Extremity Assessment  Lower Extremity Assessment: Defer to PT evaluation   Cervical / Trunk Assessment Cervical / Trunk Assessment: Normal   Communication Communication Communication: No apparent difficulties   Cognition  Arousal: Alert Behavior During Therapy: WFL for tasks assessed/performed Overall Cognitive Status: Within Functional Limits for tasks assessed                            Home Living Family/patient expects to be discharged to:: Private residence Living Arrangements: Non-relatives/Friends (roommate) Available Help at Discharge: Friend(s) Type of Home: House Home Access: Level entry     Home Layout: Two level Alternate Level Stairs-Number of Steps: 5   Bathroom Shower/Tub: Tub/shower unit         Home Equipment: Cane - single point;Shower Counsellor (2 wheels);Rollator (4 wheels);Other (comment);Hand held shower head   Additional Comments: pt reports he rents a room in a home with a few other roommates; goes up/down 5 steps to kitchen/living room area. bedroom on 2nd level of 5 steps. wears 5L O2 at baseline. Pt has a pulse oximeter at home      Prior Functioning/Environment Prior Level of Function : Independent/Modified Independent             Mobility Comments: "I don't walk much". When he walks he does not use an AD. ADLs Comments: senior living companion assists with laundry, meals. cleaning        OT Problem List: Decreased strength;Decreased safety awareness;Cardiopulmonary status limiting activity;Decreased knowledge of precautions;Decreased knowledge of use of DME or AE      OT Treatment/Interventions: Self-care/ADL training;DME and/or AE instruction;Therapeutic activities;Balance training;Patient/family education    OT Goals(Current goals can be found in the care plan section) Acute Rehab OT Goals Patient Stated Goal: to go back home OT Goal Formulation: With patient Time For Goal Achievement: 04/17/23 Potential to Achieve Goals: Fair  OT Frequency: Min 1X/week       AM-PAC OT "6 Clicks" Daily Activity     Outcome Measure Help from another person eating meals?: None Help from another person taking care of personal grooming?: None Help  from another person toileting, which includes using toliet, bedpan, or urinal?: A Little Help from another person bathing (including washing, rinsing, drying)?: A Little Help from another person to put on and taking off regular upper body clothing?: A Little Help from another person to put on and taking off regular lower body clothing?: A Little 6 Click Score: 20   End of Session Equipment Utilized During Treatment: Oxygen  Activity Tolerance: Patient limited by fatigue Patient left: in chair;with call bell/phone within reach  OT Visit Diagnosis: Unsteadiness on feet (R26.81)                Time: 1610-9604 OT Time Calculation (min): 26 min Charges:  OT General Charges $OT Visit: 1 Visit OT Evaluation $OT Eval Low Complexity: 1 Low OT Treatments $Self Care/Home Management : 8-22 mins  Rosalio Loud, MS Acute Rehabilitation Department Office# [phone]   Selinda Flavin 04/03/2023, 4:21 PM

## 2023-04-03 NOTE — Care Management Obs Status (Signed)
MEDICARE OBSERVATION STATUS NOTIFICATION   Patient Details  Name: Mario [name] MRN: [id number] Date of Birth: 27-Jun-1950   Medicare Observation Status Notification Given:  Yes    Amada Jupiter, LCSW 04/03/2023, 11:16 AM

## 2023-04-03 NOTE — Progress Notes (Addendum)
PROGRESS NOTE Mario [name]  XBJ:[id number] DOB: 1950/09/26 DOA: 04/01/2023 PCP: [name]  Brief Narrative/Hospital Course: 54 yom w/ COPD, chronic hypoxic respiratory failure on 5 L O2 [place], history of PE on Eliquis, history of left lung cancer (s/p radiation), T2DM, HTN, BPH, GAD admitted on 1/17-2/1 for hypoxic respiratory failure/pneumonia/RSV infection and COPD exacerbation presented in the same day of discharge with persistent dyspnea in setting of COPD exacerbation, CAP, RSV infection. In the ED chest x-ray revealed changing x-ray finding of small right effusion, chronic pleural and consolidative process in the left base.  Was hemodynamically stable.   Subjective: Seen and examined C/o sputum with blood since last night about 7 times He feels worse but he does not appear to be in any distress, is comfortable on 5 L home setting   Assessment and Plan: Principal Problem:   COPD with acute bronchitis (HCC) Active Problems:   Chronic respiratory failure with hypoxia, on home O2 therapy (HCC) - 4 L/min   DM2 (diabetes mellitus, type 2) (HCC)   History of pulmonary embolus (PE)   BPH (benign prostatic hyperplasia)   OSA (obstructive sleep apnea)   Hypertension associated with diabetes (HCC)   Acute COPD exacerbation Acute on chronic hypoxic respiratory failure CAP/RSV infection Recent admission 1/17 - 2/1 for same: Patient with RSV infection and CAP with subsequent CAP exacerbation and hypoxic respiratory failure acute on chronic, readmitted after 2 weeks of discharge> imaging overall unchanged, clinically with persistent dyspnea and cough in the setting of likely bronchitis COPD with obesity and deconditioning.  Managing with Brovana/Pulmicort/nebs, systemic steroid, supplemental oxygen 5 L [place].  Initial leukocytosis resolved, BNP and troponin negative x 2.  Continue PT OT.  History of PE: Continue Eliquis.  CT angio chest on last admission 1/17 no PE chronic airspace  disease of lung bases progressed area in the right lower lobe.  Cough with blood in sputum/hemoptysis: Suspect in the setting of patient's CAP RSV, also on Eliquis.  will Add antitussives, to minimize forceful cough -will d/w PCCM and may need to hold off his eliquis.   Type 2 diabetes: With uncontrolled hyperglycemia blood sugar at time of 200 but overall comfortable at this time continue SSI holding metformin  Recent Labs  Lab 04/02/23 0755 04/02/23 1233 04/02/23 1653 04/02/23 2035 04/03/23 0730  GLUCAP 195* 128* 208* 200* 158*    Hypertension: Continue diltiazem 180 mg daily.   BPH: Continue Flomax.   GAD/insomnia: Continue bupropion, BuSpar, Cymbalta, Trileptal, trazodone.  Hyponatremia: Resolved    OSA: CPAP nightly.  Obesity: Patient's Body mass index is 39.63 kg/m. : Will benefit with PCP follow-up, weight loss  healthy lifestyle and outpatient sleep evaluation.   DVT prophylaxis:  Code Status:   Code Status: Full Code Family Communication: plan of care discussed with patient at bedside. Patient status is: Remains hospitalized because of severity of illness Level of care: Med-Surg   Dispo: The patient is from: home with a roommate            Anticipated disposition: TBD, PT OT consulted Objective: Vitals last 24 hrs: Vitals:   04/03/23 0500 04/03/23 0542 04/03/23 0911 04/03/23 0913  BP:  134/66    Pulse:  89    Resp:  16    Temp:  98.4 F (36.9 C)    TempSrc:  Oral    SpO2:  98% 95% 95%  Weight: (!) 140 kg     Height:       Weight change: -1 kg  Physical Examination: General exam: alert awake,at baseline, older than stated age HEENT:Oral mucosa moist, Ear/Nose WNL grossly Respiratory system: Bilaterally air entry present with upper airway wheezing,no use of accessory muscle Cardiovascular system: S1 & S2 +, No JVD. Gastrointestinal system: Abdomen soft,NT,ND, BS+ Nervous System: Alert, awake, moving all extremities,and following  commands. Extremities: LE edema neg,distal peripheral pulses palpable and warm.  Skin: No rashes,no icterus. MSK: Normal muscle bulk,tone, power   Medications reviewed:  Scheduled Meds:  apixaban  5 mg Oral BID   arformoterol  15 mcg Nebulization BID   aspirin EC  81 mg Oral Daily   budesonide (PULMICORT) nebulizer solution  0.25 mg Nebulization BID   buPROPion  300 mg Oral q morning   busPIRone  15 mg Oral Daily   diltiazem  180 mg Oral Daily   docusate sodium  200 mg Oral BID   DULoxetine  60 mg Oral BID   gabapentin  100 mg Oral TID   guaiFENesin  1,200 mg Oral BID   insulin aspart  0-5 Units Subcutaneous QHS   insulin aspart  0-9 Units Subcutaneous TID WC   melatonin  3 mg Oral QHS   Oxcarbazepine  600 mg Oral TID   pantoprazole  40 mg Oral Daily   predniSONE  10 mg Oral Q breakfast   tamsulosin  0.8 mg Oral Daily   traZODone  75 mg Oral QHS   Continuous Infusions:   Diet Order             Diet heart healthy/carb modified Room service appropriate? Yes; Fluid consistency: Thin  Diet effective now                  Intake/Output Summary (Last 24 hours) at 04/03/2023 0937 Last data filed at 04/03/2023 0839 Gross per 24 hour  Intake 1200 ml  Output 100 ml  Net 1100 ml   Net IO Since Admission: 800 mL [04/03/23 0937]  Wt Readings from Last 3 Encounters:  04/03/23 (!) 140 kg  03/17/23 (!) 141.3 kg  01/26/23 127 kg     Unresulted Labs (From admission, onward)    None     Data Reviewed: I have personally reviewed following labs and imaging studies CBC: Recent Labs  Lab 03/28/23 0532 03/30/23 0525 04/01/23 0604 04/01/23 1840 04/02/23 0924  WBC 13.6* 12.6* 12.4* 13.7* 10.9*  NEUTROABS 8.9*  --   --  10.4*  --   HGB 13.2 13.1 12.9* 14.0 14.4  HCT 40.2 39.5 40.2 42.0 42.7  MCV 93.1 92.7 95.3 91.5 91.6  PLT 273 251 260 292 314   Basic Metabolic Panel:  Recent Labs  Lab 03/27/23 1046 03/28/23 0532 03/30/23 0525 03/31/23 0452 04/01/23 0604  04/01/23 1840 04/02/23 0924  NA 135 133* 132* 129* 132* 130* 135  K 4.7 4.4 3.9 3.6 4.1 4.2 4.8  CL 94* 96* 98 94* 98 96* 99  CO2 32 29 26 26 26 25 25   GLUCOSE 137* 167* 157* 138* 133* 156* 184*  BUN 17 23 16 18 19 18 15   CREATININE 0.88 1.05 0.75 0.92 0.90 0.75 0.60*  CALCIUM 9.1 8.4* 8.2* 8.5* 8.3* 8.6* 8.8*  MG 2.3 2.3 2.3 2.4 2.2  --   --   PHOS 3.4 3.8  --  3.7  --   --   --    GFR: Estimated Creatinine Clearance: 126.1 mL/min (A) (by C-G formula based on SCr of 0.6 mg/dL (L)). Liver Function Tests:  Recent Labs  Lab 03/28/23  0532 03/30/23 0525 04/01/23 0604 04/01/23 1840  AST 15 15 15 18   ALT 22 19 17 18   ALKPHOS 62 58 58 65  BILITOT 0.5 0.5 0.6 0.6  PROT 6.4* 6.2* 6.1* 6.9  ALBUMIN 3.2* 3.2* 3.0* 3.7   No results for input(s): "HGBA1C" in the last 72 hours. Recent Labs  Lab 04/02/23 0755 04/02/23 1233 04/02/23 1653 04/02/23 2035 04/03/23 0730  GLUCAP 195* 128* 208* 200* 158*   No results for input(s): "CHOL", "HDL", "LDLCALC", "TRIG", "CHOLHDL", "LDLDIRECT" in the last 72 hours. No results for input(s): "TSH", "T4TOTAL", "FREET4", "T3FREE", "THYROIDAB" in the last 72 hours. Sepsis Labs: No results for input(s): "PROCALCITON", "LATICACIDVEN" in the last 168 hours. Recent Results (from the past 240 hours)  Expectorated Sputum Assessment w Gram Stain, Rflx to Resp Cult     Status: None   Collection Time: 03/24/23  4:56 PM   Specimen: Expectorated Sputum  Result Value Ref Range Status   Specimen Description EXPECTORATED SPUTUM  Final   Special Requests NONE  Final   Sputum evaluation   Final    THIS SPECIMEN IS ACCEPTABLE FOR SPUTUM CULTURE Performed at [facility], [address] [place], [place] [address]    Report Status 03/24/2023 FINAL  Final  Culture, Respiratory w Gram Stain     Status: None   Collection Time: 03/24/23  4:56 PM  Result Value Ref Range Status   Specimen Description   Final    EXPECTORATED SPUTUM Performed at  [facility], [address] [place], [place] [address]    Special Requests   Final    NONE Reflexed from [id number] Performed at [facility], [address] [place], [place] [address]    Gram Stain   Final    FEW WBC PRESENT, PREDOMINANTLY PMN RARE GRAM POSITIVE COCCI RARE BUDDING YEAST SEEN    Culture   Final    MODERATE Normal respiratory flora-no Staph aureus or Pseudomonas seen Performed at [facility], [address], [place], [place] [address]    Report Status 03/27/2023 FINAL  Final    Antimicrobials/Microbiology: Anti-infectives (From admission, onward)    None         Component Value Date/Time   SDES EXPECTORATED SPUTUM 03/24/2023 1656   SDES  03/24/2023 1656    EXPECTORATED SPUTUM Performed at [facility], [address] [place], [place] [address]    SPECREQUEST NONE 03/24/2023 1656   SPECREQUEST  03/24/2023 1656    NONE Reflexed from [id number] Performed at [facility], [address] [place], [place] [address]    CULT  03/24/2023 1656    MODERATE Normal respiratory flora-no Staph aureus or Pseudomonas seen Performed at [facility], [address] [place], [place] [address]    REPTSTATUS 03/24/2023 FINAL 03/24/2023 1656   REPTSTATUS 03/27/2023 FINAL 03/24/2023 1656     Radiology Studies: DG Chest Port 1 View Result Date: 04/01/2023 CLINICAL DATA:  Shortness of breath EXAM: PORTABLE CHEST 1 VIEW COMPARISON:  03/26/2023, CT and chest x-ray 03/17/2023, 10/31/2022, 09/21/2022, CT 07/26/2020 FINDINGS: Emphysema. Small right-sided pleural effusion. Irregular and bandlike opacity in the right lower lung without significant change. Chronic pleural and consolidative process at the left base, in addition to left interstitial and ground-glass opacity. Stable cardiomediastinal silhouette. No pneumothorax IMPRESSION: Little change in radiographic appearance of the chest with small  right effusion and irregular and bandlike right lower lung opacity. Chronic pleural and consolidative process  at left base, some of which may correspond to the history of lung cancer. Electronically Signed   By: Jasmine Pang M.D.   On: 04/01/2023 18:24     LOS: 0 days   Total time spent in review of labs and imaging, patient evaluation, formulation of plan, documentation and communication with family: 50 minutes  Lanae Boast, MD  [facility]  04/03/2023, 9:37 AM

## 2023-04-03 NOTE — Consult Note (Signed)
NAME:  Mario [name], MRN:  [id number], DOB:  01/26/1951, LOS: 0 ADMISSION DATE:  04/01/2023, CONSULTATION DATE:  2/3 REFERRING MD:  KC, CHIEF COMPLAINT:  hemoptysis    History of Present Illness:  73 year old male w/ chronic resp failure in setting of COPD (5lpm baseline) remotely seen by [facility] (not seen in office > 2 yrs). Admitted from 1/17 to 2/1 w/ what AECOPD in setting of RSV infection and prob CAP after treated w/ ABX and systemic steroids.. was sent home on 5 liters rest and instructed to increase to 6 lpm w/ activity. Apparently when home rested then got up to move, walked up flight of stairs, sats 80s, marked dyspnea, HR 130s. Reported back to the ER that evening (same day of discharge)  In ER  Wbc 12.4, BNP 29/ CXR w/ right basilar band-like atx, and persistent left sided airspace disease.  Continued on oxygen, scheduled BDs ordered, got IV steroids followed by orals.  On 2/1 started having episodes of hemoptysis.  This has been fairly persistent.  It has been bright red, about a teaspoon in size happening fairly frequently at least once every 2 hours and has persisted over night into 2/3 he also continues to have resting dyspnea and thus PCCM asked to eval  Pertinent  Medical History  Chronic resp failure 5 lpm baseline, remote PE (on DOAC), lung cancer s/p XRT, type II DM, HTN, BPH, GAD.grade I diastolic dysfxn ef 60-65% Significant Hospital Events: Including procedures, antibiotic start and stop dates in addition to other pertinent events   2/1 discharged and readmitted following treatment for RSV infection possible And AECOPD placed back on steroids and nebulizers  Interim History / Subjective:  Still has significant rhonchus cough  Objective   Blood pressure 134/66, pulse 89, temperature 98.4 F (36.9 C), temperature source Oral, resp. rate 16, height 6\' 2"  (1.88 m), weight (!) 140 kg, SpO2 95%.        Intake/Output Summary (Last 24 hours) at 04/03/2023 1144 Last data  filed at 04/03/2023 4098 Gross per 24 hour  Intake 960 ml  Output 100 ml  Net 860 ml   Filed Weights   04/01/23 1721 04/03/23 0500  Weight: (!) 141 kg (!) 140 kg    Examination: General: 73 year old male patient sitting up in bed still has fairly clear increased work of breathing with exertional and resting dyspnea HENT: Upper airway hoarseness and rhonchus cough turbulent upper airway noises note some improving but still present nasal drip Lungs: Some diffuse rhonchi diminished in the left base currently on 6 L/min Cardiovascular: Regular rate and rhythm Abdomen: Soft not tender Extremities: Warm trivial edema Neuro: Awake and oriented GU: Voids  Resolved Hospital Problem list     Assessment & Plan:  Acute on chronic hypoxic resp failure Recent RSV viral infection  Recent CAP (NOS) treated w/ abx AECOPD New hemoptysis  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  He is also had some sinus irritation and some degree of sinusitis show occasional aspiration of blood a possibility as well  plan Hold his DOAC for 48 hours Treat cough by adding nasal hygiene as well as optimizing reflux therapy  Chronic hypoxic resp  failure 2/2 COPD s/p recent RSV URI and CAP superimposed on underlying remote lung cancer w/ chronic scarring and volume loss of left lower lobe Plan Continue supplemental oxygen Will pulse his prednisone back to 40 a day would continue with a slow taper Continue scheduled bronchodilators Continue supplemental oxygen Anticipate a prolonged recovery typical of RSV infection  PE (2022) Latest imaging negative Plan Hold DOAC for now w/ hemoptysis   Obstructive  sleep apnea On CPAP at home but intolerant of our masks Plan I have encouraged him to bring his mask from home Resume home CPAP at at bedtime as tolerated  Would recommend inpatient status clearly not ready for discharge anticipate prolonged recovery, ? LTAC?  Best Practice (right click and "Reselect all SmartList Selections" daily)   Per primary   Labs   CBC: Recent Labs  Lab 03/28/23 0532 03/30/23 0525 04/01/23 0604 04/01/23 1840 04/02/23 0924  WBC 13.6* 12.6* 12.4* 13.7* 10.9*  NEUTROABS 8.9*  --   --  10.4*  --   HGB 13.2 13.1 12.9* 14.0 14.4  HCT 40.2 39.5 40.2 42.0 42.7  MCV 93.1 92.7 95.3 91.5 91.6  PLT 273 251 260 292 314    Basic Metabolic Panel: Recent Labs  Lab 03/28/23 0532 03/30/23 0525 03/31/23 0452 04/01/23 0604 04/01/23 1840 04/02/23 0924  NA 133* 132* 129* 132* 130* 135  K 4.4 3.9 3.6 4.1 4.2 4.8  CL 96* 98 94* 98 96* 99  CO2 29 26 26 26 25 25   GLUCOSE 167* 157* 138* 133* 156* 184*  BUN 23 16 18 19 18 15   CREATININE 1.05 0.75 0.92 0.90 0.75 0.60*  CALCIUM 8.4* 8.2* 8.5* 8.3* 8.6* 8.8*  MG 2.3 2.3 2.4 2.2  --   --   PHOS 3.8  --  3.7  --   --   --    GFR: Estimated Creatinine Clearance: 126.1 mL/min (A) (by C-G formula based on SCr of 0.6 mg/dL (L)). Recent Labs  Lab 03/30/23 0525 04/01/23 0604 04/01/23 1840 04/02/23 0924  WBC 12.6* 12.4* 13.7* 10.9*    Liver Function Tests: Recent Labs  Lab 03/28/23 0532 03/30/23 0525 04/01/23 0604 04/01/23 1840  AST 15 15 15 18   ALT 22 19 17 18   ALKPHOS 62 58 58 65  BILITOT 0.5 0.5 0.6 0.6  PROT 6.4* 6.2* 6.1* 6.9  ALBUMIN 3.2* 3.2* 3.0* 3.7   No results for input(s): "LIPASE", "AMYLASE" in the last 168 hours. No results for input(s): "AMMONIA" in the last 168 hours.  ABG    Component Value Date/Time   PHART 7.39 08/07/2021 1004   PCO2ART 47 08/07/2021 1004   PO2ART 72 (L) 08/07/2021 1004   HCO3 30.6 (H) 04/01/2023 1840   TCO2 28 09/10/2022 1932   ACIDBASEDEF 3.3 (H) 06/04/2021 0810    O2SAT 84 04/01/2023 1840     Coagulation Profile: No results for input(s): "INR", "PROTIME" in the last 168 hours.  Cardiac Enzymes: No results for input(s): "CKTOTAL", "CKMB", "CKMBINDEX", "TROPONINI" in the last 168 hours.  HbA1C: Hgb A1c MFr Bld  Date/Time Value Ref Range Status  03/18/2023 03:23 AM 6.7 (H) 4.8 - 5.6 % Final    Comment:    (NOTE) Pre diabetes:          5.7%-6.4%  Diabetes:              >6.4%  Glycemic control for   <7.0% adults with diabetes   09/25/2022 02:31 PM 6.7 (H) 4.8 - 5.6 % Final  Comment:    (NOTE)         Prediabetes: 5.7 - 6.4         Diabetes: >6.4         Glycemic control for adults with diabetes: <7.0     CBG: Recent Labs  Lab 04/02/23 0755 04/02/23 1233 04/02/23 1653 04/02/23 2035 04/03/23 0730  GLUCAP 195* 128* 208* 200* 158*    Review of Systems:   Review of Systems  Constitutional:  Positive for malaise/fatigue. Negative for fever.  HENT:         Sinus congestion improved but still present, has had blood clots from his nose on occasion but not frequently  Eyes: Negative.   Respiratory:  Positive for cough, hemoptysis, sputum production, shortness of breath and wheezing.   Cardiovascular: Negative.   Gastrointestinal:  Positive for heartburn.  Genitourinary: Negative.   Musculoskeletal: Negative.   Skin: Negative.   Neurological: Negative.   Endo/Heme/Allergies: Negative.      Past Medical History:  He,  has a past medical history of Ankylosing spondylitis lumbar region [facility]) (09/25/2022), Anxiety, Bronchitis, COPD (chronic obstructive pulmonary disease) (HCC), Depression, History of radiation therapy, Hypertension, lung ca (09/2020), MI (myocardial infarction) (HCC), On home oxygen therapy, OSA (obstructive sleep apnea), Suicide attempt (HCC), Tension pneumothorax (06/27/2016), and Uveitis.   Surgical History:   Past Surgical History:  Procedure Laterality Date   BIOPSY  07/03/2021   Procedure: BIOPSY;   Surgeon: Kathi Der, MD;  Location: [facility];  Service: Gastroenterology;;   BRONCHIAL BIOPSY  07/30/2020   Procedure: BRONCHIAL BIOPSIES;  Surgeon: Josephine Igo, DO;  Location: [facility];  Service: Pulmonary;;   BRONCHIAL BRUSHINGS  07/30/2020   Procedure: BRONCHIAL BRUSHINGS;  Surgeon: Josephine Igo, DO;  Location: [facility];  Service: Pulmonary;;   BRONCHIAL NEEDLE ASPIRATION BIOPSY  07/30/2020   Procedure: BRONCHIAL NEEDLE ASPIRATION BIOPSIES;  Surgeon: Josephine Igo, DO;  Location: [facility];  Service: Pulmonary;;   BRONCHIAL WASHINGS  07/30/2020   Procedure: BRONCHIAL WASHINGS;  Surgeon: Josephine Igo, DO;  Location: [facility];  Service: Pulmonary;;   CHEST TUBE INSERTION Left 06/27/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     ESOPHAGOGASTRODUODENOSCOPY N/A 07/03/2021   Procedure: ESOPHAGOGASTRODUODENOSCOPY (EGD);  Surgeon: Kathi Der, MD;  Location: [facility];  Service: Gastroenterology;  Laterality: N/A;   IR PERC PLEURAL DRAIN W/INDWELL CATH W/IMG GUIDE  08/04/2021   IR REMOVAL OF PLURAL CATH W/CUFF  09/01/2021   SKIN CANCER EXCISION     VIDEO BRONCHOSCOPY WITH ENDOBRONCHIAL NAVIGATION Left 07/30/2020   Procedure: VIDEO BRONCHOSCOPY WITH ENDOBRONCHIAL NAVIGATION;  Surgeon: Josephine Igo, DO;  Location: [facility];  Service: Pulmonary;  Laterality: Left;     Social History:   reports that he quit smoking about 6 years ago. His smoking use included cigarettes. He started smoking about 41 years ago. He has a 35 pack-year smoking history. He has been exposed to tobacco smoke. He has never used smokeless tobacco. He reports that he does not drink alcohol and does not use drugs.   Family History:  His family history includes Dementia in his father.   Allergies Allergies  Allergen Reactions   Demerol [Meperidine] Nausea And Vomiting and Other (See Comments)    Made the patient "violently sick"   Zocor [Simvastatin] Nausea And Vomiting and Other (See Comments)    Made  him very jittery, also   Beet [Beta Vulgaris] Nausea And Vomiting   Liver Nausea And Vomiting     Home Medications  Prior to Admission medications   Medication Sig Start Date End Date Taking? Authorizing Provider  acetaminophen (TYLENOL) 500 MG tablet Take 1,000 mg by mouth every 6 (six) hours as needed for mild pain (pain score 1-3) or moderate pain (pain score 4-6).   Yes [provider]  albuterol (PROVENTIL) (2.5 MG/3ML) 0.083% nebulizer solution Take 2.5 mg by nebulization every 6 (six) hours as needed for wheezing or shortness of breath.   Yes [provider]  albuterol (VENTOLIN HFA) 108 (90 Base) MCG/ACT inhaler Inhale 2 puffs into the lungs every 4 (four) hours as needed for wheezing or shortness of breath. 11/04/21  Yes Glade Lloyd, MD  apixaban (ELIQUIS) 5 MG TABS tablet Take 5 mg by mouth 2 (two) times daily.   Yes [provider]  ascorbic acid (VITAMIN C) 500 MG tablet Take 500 mg by mouth daily.   Yes [provider]  aspirin EC 81 MG tablet Take 81 mg by mouth daily. Swallow whole.   Yes [provider]  azelastine (ASTELIN) 0.1 % nasal spray Place 1 spray into both nostrils 2 (two) times daily. Use in each nostril as directed   Yes [provider]  bisacodyl (DULCOLAX) 5 MG EC tablet Take 2 tablets (10 mg total) by mouth daily as needed for moderate constipation. 01/26/23  Yes Pfeiffer, Lebron Conners, MD  BLACK CURRANT SEED OIL PO Take 2 each by mouth daily.   Yes [provider]  buPROPion (WELLBUTRIN XL) 300 MG 24 hr tablet Take 300 mg by mouth every morning. 02/05/20  Yes [provider]  busPIRone (BUSPAR) 15 MG tablet Take 15 mg by mouth in the morning.   Yes [provider]  Calcium Carbonate (CALCIUM 500 PO) Take 1 tablet by mouth daily.   Yes [provider]  Carboxymethylcellulose Sod PF 0.5 % SOLN Place 1 drop into both eyes 4 (four) times daily as needed (dry eyes).   Yes [provider]  cholecalciferol (VITAMIN D3) 25 MCG (1000 UNIT) tablet Take 1,000 Units by mouth daily.   Yes [provider]  cyclobenzaprine (FLEXERIL) 10 MG tablet Take 10 mg by mouth at bedtime.   Yes [provider]  cycloSPORINE (RESTASIS) 0.05 % ophthalmic emulsion Place 1 drop into both eyes every 12 (twelve) hours.   Yes [provider]  diltiazem (CARDIZEM CD) 180 MG 24 hr capsule Take 180 mg by mouth in the morning.   Yes [provider]  docusate sodium (COLACE) 100 MG capsule Take 200 mg by mouth 2 (two) times daily.   Yes [provider]  DULoxetine (CYMBALTA) 60 MG capsule Take 60 mg by mouth 2 (two) times daily.   Yes [provider]  fluticasone (FLONASE) 50 MCG/ACT nasal spray Place 2 sprays into both nostrils daily.    Yes [provider]  folic acid (FOLVITE) 1 MG tablet Take 1 mg by mouth daily.   Yes [provider]  gabapentin (NEURONTIN) 100 MG capsule Take 100 mg by mouth 3 (three) times daily.   Yes [provider]  GARLIC PO Take 1 tablet by mouth daily.   Yes [provider]  KRILL OIL PO Take 2 capsules by mouth daily.   Yes [provider]  LACTULOSE PO Take 2 Scoops by mouth 2 (two) times daily as needed. 2 tablespoonfuls   Yes [provider]  lidocaine (XYLOCAINE) 5 % ointment Apply 1 Application topically 2 (two) times daily as needed.   Yes  [provider]  MAGNESIUM OXIDE PO Take 1 tablet by mouth at bedtime.   Yes [provider]  Melatonin 3 MG CAPS Take 3 mg by mouth at bedtime.   Yes [provider]  metFORMIN (GLUCOPHAGE) 850 MG tablet Take 850 mg by mouth every evening.   Yes [provider]  Multiple Vitamin (MULTIVITAMIN ADULT PO) Take 1 tablet by mouth daily.   Yes [provider]  Nutritional Supplements (ENSURE ORIGINAL) LIQD Take 1 Bottle by mouth daily as needed (in between meals).   Yes [provider]  omeprazole (PRILOSEC) 20 MG capsule Take 1 capsule (20 mg total) by mouth daily. 01/26/23  Yes Arby Barrette, MD  Oxcarbazepine (TRILEPTAL) 300 MG tablet Take 600 mg by mouth in the morning, at noon, and at bedtime.   Yes [provider]  polyethylene glycol powder (GLYCOLAX/MIRALAX) 17 GM/SCOOP powder Take 17 g by mouth daily.   Yes [provider]  predniSONE (DELTASONE) 10 MG tablet Take 1 tablet (10 mg total) by mouth daily with breakfast for 2 days. 04/02/23 04/04/23 Yes Gherghe, Daylene Katayama, MD  psyllium (HYDROCIL/METAMUCIL) 95 % PACK Take 1 packet by mouth daily. 04/02/23  Yes Leatha Gilding, MD  tamsulosin (FLOMAX) 0.4 MG CAPS capsule Take 0.8 mg by mouth daily.   Yes [provider]  Tiotropium Bromide-Olodaterol 2.5-2.5 MCG/ACT AERS Inhale 2 each into the lungs every morning. 2 puffs   Yes [provider]  traZODone (DESYREL) 50 MG tablet Take 75 mg by mouth at bedtime.   Yes [provider]  vitamin B-12 (CYANOCOBALAMIN) 100 MCG tablet Take 100 mcg by mouth daily.   Yes [provider]  albuterol (PROVENTIL) (2.5 MG/3ML) 0.083% nebulizer solution Inhale 3 mLs (2.5 mg total) by nebulization every 4 (four) hours as needed for wheezing or shortness of breath. 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 Patient not taking: Reported on 03/17/2023    [provider]  mometasone [facility]) 220 MCG/ACT inhaler Inhale 2 puffs into the lungs daily as needed. Patient not taking: Reported on 03/17/2023    [provider]     Critical care time: NA

## 2023-04-03 NOTE — Progress Notes (Signed)
Physical Therapy Treatment Patient Details Name: Mario [name] MRN: [id number] DOB: 01-Sep-1950 Today's Date: 04/03/2023   History of Present Illness 73 y.o. male presents to the ED for evaluation of shortness of breath. pt was discharged earlier same day after being admitted initially on 03/17/2023 for acute on chronic hypoxic respiratory failure in the setting of community-acquired pneumonia, RSV infection, and COPD exacerbation.    medical history significant for COPD, chronic hypoxic respiratory failure on 5 L O2 [place], history of PE on Eliquis, history of left lung cancer (s/p radiation), T2DM, HTN, BPH, GAD    PT Comments  Pt states he is generally not feeling well today; activity tol limited, dyspneic with minimal activity and continues to heave hemoptysis during PT session. Pt is steady ascending/descending stairs with single handrail support however fatigues rapidly; SpO2=84% on 6L with sitting rest after stair training, HR 111; recovered to 92 % in 1-2 minutes; returned to 5L at rest with SpO2=92%. Continue PT in acute setting   If plan is discharge home, recommend the following: Help with stairs or ramp for entrance   Can travel by private vehicle        Equipment Recommendations  None recommended by PT    Recommendations for Other Services       Precautions / Restrictions Precautions Precaution Comments: monitor O2 7 HR/5L at baseline Restrictions Weight Bearing Restrictions Per Provider Order: No     Mobility  Bed Mobility Overal bed mobility: Modified Independent                  Transfers   Equipment used: None Transfers: Sit to/from Stand Sit to Stand: Supervision           General transfer comment: for safety    Ambulation/Gait Ambulation/Gait assistance: Supervision Gait Distance (Feet): 30 Feet Assistive device: None Gait Pattern/deviations: Step-through pattern       General Gait Details: limited gait distance today tp conserve energy for  stair practice   Stairs Stairs: Yes Stairs assistance: Supervision, Contact guard assist Stair Management: One rail Right, Forwards, Alternating pattern Number of Stairs: 5 (x2) General stair comments: steady with single handrail support however fatigues easily; SpO2=84% on 6L with sitting rest after stair practice, HR 111; recovered to 92 % in 1-2 minutes; returned to 5L at rest with SpO2=92%   Wheelchair Mobility     Tilt Bed    Modified Rankin (Stroke Patients Only)       Balance                                            Cognition Arousal: Alert Behavior During Therapy: WFL for tasks assessed/performed Overall Cognitive Status: Within Functional Limits for tasks assessed                                 General Comments: pt reports he just generally feels bad today; continuing hemoptysis        Exercises      General Comments        Pertinent Vitals/Pain Pain Assessment Pain Assessment: No/denies pain    Home Living                          Prior Function  PT Goals (current goals can now be found in the care plan section) Acute Rehab PT Goals PT Goal Formulation: With patient Time For Goal Achievement: 04/16/23 Potential to Achieve Goals: Good Progress towards PT goals: Progressing toward goals    Frequency    Min 1X/week      PT Plan      Co-evaluation              AM-PAC PT "6 Clicks" Mobility   Outcome Measure  Help needed turning from your back to your side while in a flat bed without using bedrails?: None Help needed moving from lying on your back to sitting on the side of a flat bed without using bedrails?: None Help needed moving to and from a bed to a chair (including a wheelchair)?: A Little Help needed standing up from a chair using your arms (e.g., wheelchair or bedside chair)?: A Little Help needed to walk in hospital room?: A Little Help needed climbing 3-5 steps  with a railing? : A Little 6 Click Score: 20    End of Session Equipment Utilized During Treatment: Gait belt Activity Tolerance: Patient tolerated treatment well Patient left: in bed;with call bell/phone within reach   PT Visit Diagnosis: Other abnormalities of gait and mobility (R26.89)     Time: 1191-4782 PT Time Calculation (min) (ACUTE ONLY): 14 min  Charges:    $Gait Training: 8-22 mins PT General Charges $$ ACUTE PT VISIT: 1 Visit                     Delice Bison, PT  Acute Rehab Dept [facility]) [phone]  04/03/2023    [facility] 04/03/2023, 12:31 PM

## 2023-04-03 NOTE — Progress Notes (Signed)
   04/03/23 2106  BiPAP/CPAP/SIPAP  BiPAP/CPAP/SIPAP Pt Type Adult  Reason BIPAP/CPAP not in use Non-compliant  BiPAP/CPAP /SiPAP Vitals  SpO2 94 %  Bilateral Breath Sounds Diminished

## 2023-04-03 NOTE — Progress Notes (Signed)
   04/03/23 1119  TOC Brief Assessment  Insurance and Status Reviewed  Patient has primary care physician Yes  Home environment has been reviewed home with roomates  Prior level of function: independent  Prior/Current Home Services No current home services  Social Drivers of Health Review SDOH reviewed no interventions necessary  Readmission risk has been reviewed Yes  Transition of care needs no transition of care needs at this time   May need assistance with dc transportation - monitor.

## 2023-04-04 DIAGNOSIS — E119 Type 2 diabetes mellitus without complications: Secondary | ICD-10-CM | POA: Diagnosis not present

## 2023-04-04 DIAGNOSIS — D6832 Hemorrhagic disorder due to extrinsic circulating anticoagulants: Secondary | ICD-10-CM | POA: Diagnosis not present

## 2023-04-04 DIAGNOSIS — E1165 Type 2 diabetes mellitus with hyperglycemia: Secondary | ICD-10-CM | POA: Diagnosis present

## 2023-04-04 DIAGNOSIS — I10 Essential (primary) hypertension: Secondary | ICD-10-CM | POA: Diagnosis not present

## 2023-04-04 DIAGNOSIS — Z85118 Personal history of other malignant neoplasm of bronchus and lung: Secondary | ICD-10-CM | POA: Diagnosis not present

## 2023-04-04 DIAGNOSIS — F332 Major depressive disorder, recurrent severe without psychotic features: Secondary | ICD-10-CM | POA: Diagnosis present

## 2023-04-04 DIAGNOSIS — Z87891 Personal history of nicotine dependence: Secondary | ICD-10-CM | POA: Diagnosis not present

## 2023-04-04 DIAGNOSIS — E861 Hypovolemia: Secondary | ICD-10-CM | POA: Diagnosis not present

## 2023-04-04 DIAGNOSIS — J440 Chronic obstructive pulmonary disease with acute lower respiratory infection: Secondary | ICD-10-CM | POA: Diagnosis present

## 2023-04-04 DIAGNOSIS — J441 Chronic obstructive pulmonary disease with (acute) exacerbation: Secondary | ICD-10-CM | POA: Diagnosis present

## 2023-04-04 DIAGNOSIS — R0602 Shortness of breath: Secondary | ICD-10-CM | POA: Diagnosis present

## 2023-04-04 DIAGNOSIS — R042 Hemoptysis: Secondary | ICD-10-CM | POA: Diagnosis not present

## 2023-04-04 DIAGNOSIS — I152 Hypertension secondary to endocrine disorders: Secondary | ICD-10-CM | POA: Diagnosis present

## 2023-04-04 DIAGNOSIS — J439 Emphysema, unspecified: Secondary | ICD-10-CM | POA: Diagnosis present

## 2023-04-04 DIAGNOSIS — T45515A Adverse effect of anticoagulants, initial encounter: Secondary | ICD-10-CM | POA: Diagnosis not present

## 2023-04-04 DIAGNOSIS — R45851 Suicidal ideations: Secondary | ICD-10-CM | POA: Diagnosis not present

## 2023-04-04 DIAGNOSIS — C3412 Malignant neoplasm of upper lobe, left bronchus or lung: Secondary | ICD-10-CM | POA: Diagnosis present

## 2023-04-04 DIAGNOSIS — I110 Hypertensive heart disease with heart failure: Secondary | ICD-10-CM | POA: Diagnosis present

## 2023-04-04 DIAGNOSIS — J9621 Acute and chronic respiratory failure with hypoxia: Secondary | ICD-10-CM | POA: Diagnosis not present

## 2023-04-04 DIAGNOSIS — M456 Ankylosing spondylitis lumbar region: Secondary | ICD-10-CM | POA: Diagnosis present

## 2023-04-04 DIAGNOSIS — I5032 Chronic diastolic (congestive) heart failure: Secondary | ICD-10-CM | POA: Diagnosis present

## 2023-04-04 DIAGNOSIS — J449 Chronic obstructive pulmonary disease, unspecified: Secondary | ICD-10-CM | POA: Diagnosis not present

## 2023-04-04 DIAGNOSIS — J209 Acute bronchitis, unspecified: Secondary | ICD-10-CM | POA: Diagnosis present

## 2023-04-04 DIAGNOSIS — E871 Hypo-osmolality and hyponatremia: Secondary | ICD-10-CM | POA: Diagnosis present

## 2023-04-04 DIAGNOSIS — E1159 Type 2 diabetes mellitus with other circulatory complications: Secondary | ICD-10-CM | POA: Diagnosis present

## 2023-04-04 LAB — BASIC METABOLIC PANEL
Anion gap: 8 (ref 5–15)
BUN: 17 mg/dL (ref 8–23)
CO2: 26 mmol/L (ref 22–32)
Calcium: 8.4 mg/dL — ABNORMAL LOW (ref 8.9–10.3)
Chloride: 99 mmol/L (ref 98–111)
Creatinine, Ser: 0.75 mg/dL (ref 0.61–1.24)
GFR, Estimated: >60 mL/min (ref >60)
Glucose, Bld: 163 mg/dL — ABNORMAL HIGH (ref 70–99)
Potassium: 4 mmol/L (ref 3.5–5.1)
Sodium: 133 mmol/L — ABNORMAL LOW (ref 135–145)

## 2023-04-04 LAB — CBC
HCT: 38.9 % — ABNORMAL LOW (ref 39.0–52.0)
Hemoglobin: 12.8 g/dL — ABNORMAL LOW (ref 13.0–17.0)
MCH: 30.4 pg (ref 26.0–34.0)
MCHC: 32.9 g/dL (ref 30.0–36.0)
MCV: 92.4 fL (ref 80.0–100.0)
Platelets: 264 10*3/uL (ref 150–400)
RBC: 4.21 MIL/uL — ABNORMAL LOW (ref 4.22–5.81)
RDW: 13.2 % (ref 11.5–15.5)
WBC: 10.2 10*3/uL (ref 4.0–10.5)
nRBC: 0 % (ref 0.0–0.2)

## 2023-04-04 LAB — GLUCOSE, CAPILLARY
Glucose-Capillary: 137 mg/dL — ABNORMAL HIGH (ref 70–99)
Glucose-Capillary: 210 mg/dL — ABNORMAL HIGH (ref 70–99)
Glucose-Capillary: 188 mg/dL — ABNORMAL HIGH (ref 70–99)
Glucose-Capillary: 126 mg/dL — ABNORMAL HIGH (ref 70–99)

## 2023-04-04 MED ORDER — POLYETHYLENE GLYCOL 3350 17 G PO PACK
17.0000 g | PACK | Freq: Every day | ORAL | Status: DC | PRN
  Administered 2023-04-04 – 2023-04-05 (×2): 17 g via ORAL
  Filled 2023-04-04 (×3): qty 1

## 2023-04-04 MED ORDER — CYCLOBENZAPRINE HCL 5 MG PO TABS
7.5000 mg | ORAL_TABLET | Freq: Once | ORAL | Status: AC
  Administered 2023-04-04: 7.5 mg via ORAL

## 2023-04-04 NOTE — Progress Notes (Signed)
   04/04/23 1959  BiPAP/CPAP/SIPAP  BiPAP/CPAP/SIPAP Pt Type Adult  Reason BIPAP/CPAP not in use Non-compliant

## 2023-04-04 NOTE — Progress Notes (Signed)
 Mobility Specialist - Progress Note   04/04/23 0858  Oxygen  Therapy  SpO2 90 %  O2 Device Nasal Cannula  O2 Flow Rate (L/min) 6 L/min  Patient Activity (if Appropriate) Ambulating  Mobility  Activity Ambulated independently in hallway  Level of Assistance Independent  Assistive Device None  Distance Ambulated (ft) 160 ft  Activity Response Tolerated well  Mobility Referral Yes  Mobility visit 1 Mobility  Mobility Specialist Start Time (ACUTE ONLY) 0845  Mobility Specialist Stop Time (ACUTE ONLY) 0857  Mobility Specialist Time Calculation (min) (ACUTE ONLY) 12 min   Pt received in bed and agreeable to mobility. Pt took x2 standing rest breakst d/t SOB. Upon returning to room, pt desat to 88%. Encouraged pursed lip breaths bringing SpO2% to 91%. No other complaints during session. Pt to bed after session with all needs met.    Pre-mobility: 83 HR, 93% SpO2 (6L [place]) During mobility: 100 HR, 90% SpO2 (6L [place]) Post-mobility: 92 HR, 88-90% SPO2 (6L [place])  Chief Technology Officer

## 2023-04-04 NOTE — Plan of Care (Signed)
  Problem: Coping: Goal: Ability to adjust to condition or change in health will improve Outcome: Progressing   Problem: Fluid Volume: Goal: Ability to maintain a balanced intake and output will improve Outcome: Progressing   Problem: Health Behavior/Discharge Planning: Goal: Ability to manage health-related needs will improve Outcome: Progressing

## 2023-04-04 NOTE — Progress Notes (Signed)
 PROGRESS NOTE Mario [name]  FMW:[id number] DOB: 1950-07-29 DOA: 04/01/2023 PCP: [name]  Brief Narrative/Hospital Course: 53 yom w/ COPD, chronic hypoxic respiratory failure on 5 L O2 [place], history of PE on Eliquis , history of left lung cancer (s/p radiation), T2DM, HTN, BPH, GAD admitted on 1/17-2/1 for hypoxic respiratory failure/pneumonia/RSV infection and COPD exacerbation presented in the same day of discharge with persistent dyspnea in setting of COPD exacerbation, CAP, RSV infection. In the ED chest x-ray revealed changing x-ray finding of small right effusion, chronic pleural and consolidative process in the left base.  Was hemodynamically stable.   Subjective: Seen and examined  Still having cough with hemoptysis this morning Breathing some better at times On 6 to nasal cannula, overnight afebrile, hemoglobin stable   Assessment and Plan: Principal Problem:   COPD with acute bronchitis (HCC) Active Problems:   Chronic respiratory failure with hypoxia, on home O2 therapy (HCC) - 4 L/min   DM2 (diabetes mellitus, type 2) (HCC)   History of pulmonary embolus (PE)   BPH (benign prostatic hyperplasia)   OSA (obstructive sleep apnea)   Hypertension associated with diabetes (HCC)   COPD exacerbation (HCC)   Acute exacerbation of end-stage COPD Acute on chronic hypoxic respiratory failure-on 5 L [place] at baseline CAP/RSV infection Bronchitis Recent admission 1/17- 2/1 for same: Patient with RSV infection and CAP with subsequent COPD exacerbation and hypoxic respiratory failure acute on chronic, readmitted same day after 2 weeks of discharge>imaging overall unchanged.  Having cough shortness of breath, now with hemoptysis Pulmonary following closely, continue Brovana , Pulmicort  twice daily, prednisone  40 mg, bronchodilators antitussives, supplemental oxygen  IS ptot. Initial leukocytosis resolved, BNP and troponin negative x 2.  History of PE: Remote history of PE on  Eliquis - but given his history of lung cancer-was maintained on Eliquis .  Holding for now due to hemoptysisCT angio chest on last admission 1/17 no PE chronic airspace disease of lung bases progressed area in the right lower lobe.  Hemoptysis  Hemoptysis due to DOAC in the setting of CAP/RSV infection.  Holding Eliquis  for now, appreciate pulmonary input, continue antitussives , steroid.  Lung cancer: Not currently on treatment   Type 2 diabetes: With uncontrolled hyperglycemia blood sugar at time of 240s but overall controlled on sliding scale, holding metformin   Recent Labs  Lab 04/03/23 0730 04/03/23 1151 04/03/23 1651 04/03/23 2055 04/04/23 0737  GLUCAP 158* 119* 137* 240* 137*    Hypertension: Controlled on Diltiazem  180 mg daily.   BPH: Continue Flomax .   GAD/insomnia: Continue bupropion , BuSpar , Cymbalta , Trileptal , trazodone .  Hyponatremia: Resolved    OSA: CPAP nightly.  Obesity: Patient's Body mass index is 38.07 kg/m. : Will benefit with PCP follow-up, weight loss  healthy lifestyle and outpatient sleep evaluation.   DVT prophylaxis:  Code Status:   Code Status: Full Code Family Communication: plan of care discussed with patient at bedside. Patient status is: Remains hospitalized because of severity of illness Level of care: Med-Surg   Dispo: The patient is from: home with a roommate            Anticipated disposition: TBD continue PT OT   objective: Vitals last 24 hrs: Vitals:   04/03/23 2106 04/04/23 0609 04/04/23 0610 04/04/23 0858  BP:  130/73    Pulse:  75    Resp:  18    Temp:  98.2 F (36.8 C)    TempSrc:  Oral    SpO2: 94% 97%  90%  Weight:   134.5 kg  Height:       Weight change: -5.5 kg  Physical Examination: General exam: alert awake, obese, coughing oriented at baseline, older than stated age HEENT:Oral mucosa moist, Ear/Nose WNL grossly Respiratory system: Bilaterally diminished BS,no use of accessory muscle Cardiovascular  system: S1 & S2 +, No JVD. Gastrointestinal system: Abdomen soft,NT,ND, BS+ Nervous System: Alert, awake, moving all extremities,and following commands. Extremities: LE edema neg,distal peripheral pulses palpable and warm.  Skin: No rashes,no icterus. MSK: Normal muscle bulk,tone, power   Medications reviewed:  Scheduled Meds:  arformoterol   15 mcg Nebulization BID   ascorbic acid   500 mg Oral Daily   aspirin  EC  81 mg Oral Daily   azelastine   2 spray Each Nare BID   budesonide  (PULMICORT ) nebulizer solution  0.25 mg Nebulization BID   buPROPion   300 mg Oral q morning   busPIRone   15 mg Oral Daily   cholecalciferol   1,000 Units Oral Daily   diltiazem   180 mg Oral Daily   docusate sodium   200 mg Oral BID   DULoxetine   60 mg Oral BID   fluticasone   2 spray Each Nare Daily   folic acid   1 mg Oral Daily   gabapentin   100 mg Oral TID   guaiFENesin   1,200 mg Oral BID   insulin  aspart  0-5 Units Subcutaneous QHS   insulin  aspart  0-9 Units Subcutaneous TID WC   melatonin  3 mg Oral QHS   Oxcarbazepine   600 mg Oral TID   pantoprazole   40 mg Oral BID   predniSONE   40 mg Oral Q breakfast   revefenacin   175 mcg Nebulization Daily   sodium chloride   2 spray Each Nare BID   tamsulosin   0.8 mg Oral Daily   traZODone   75 mg Oral QHS   Continuous Infusions:   Diet Order             Diet heart healthy/carb modified Room service appropriate? Yes; Fluid consistency: Thin  Diet effective now                  Intake/Output Summary (Last 24 hours) at 04/04/2023 1112 Last data filed at 04/04/2023 1039 Gross per 24 hour  Intake 870 ml  Output 2250 ml  Net -1380 ml   Net IO Since Admission: -580 mL [04/04/23 1112]  Wt Readings from Last 3 Encounters:  04/04/23 134.5 kg  03/17/23 (!) 141.3 kg  01/26/23 127 kg     Unresulted Labs (From admission, onward)     Start     Ordered   04/04/23 0500  Basic metabolic panel  Daily,   R      04/03/23 0956   04/04/23 0500  CBC  Daily,   R       04/03/23 0956          Data Reviewed: I have personally reviewed following labs and imaging studies CBC: Recent Labs  Lab 03/30/23 0525 04/01/23 0604 04/01/23 1840 04/02/23 0924 04/04/23 0508  WBC 12.6* 12.4* 13.7* 10.9* 10.2  NEUTROABS  --   --  10.4*  --   --   HGB 13.1 12.9* 14.0 14.4 12.8*  HCT 39.5 40.2 42.0 42.7 38.9*  MCV 92.7 95.3 91.5 91.6 92.4  PLT 251 260 292 314 264   Basic Metabolic Panel:  Recent Labs  Lab 03/30/23 0525 03/31/23 0452 04/01/23 0604 04/01/23 1840 04/02/23 0924 04/04/23 0508  NA 132* 129* 132* 130* 135 133*  K 3.9 3.6 4.1 4.2 4.8 4.0  CL 98 94* 98 96* 99 99  CO2 26 26 26 25 25 26   GLUCOSE 157* 138* 133* 156* 184* 163*  BUN 16 18 19 18 15 17   CREATININE 0.75 0.92 0.90 0.75 0.60* 0.75  CALCIUM  8.2* 8.5* 8.3* 8.6* 8.8* 8.4*  MG 2.3 2.4 2.2  --   --   --   PHOS  --  3.7  --   --   --   --    GFR: Estimated Creatinine Clearance: 123.5 mL/min (by C-G formula based on SCr of 0.75 mg/dL). Liver Function Tests:  Recent Labs  Lab 03/30/23 0525 04/01/23 0604 04/01/23 1840  AST 15 15 18   ALT 19 17 18   ALKPHOS 58 58 65  BILITOT 0.5 0.6 0.6  PROT 6.2* 6.1* 6.9  ALBUMIN 3.2* 3.0* 3.7   No results for input(s): HGBA1C in the last 72 hours. Recent Labs  Lab 04/03/23 0730 04/03/23 1151 04/03/23 1651 04/03/23 2055 04/04/23 0737  GLUCAP 158* 119* 137* 240* 137*   No results for input(s): CHOL, HDL, LDLCALC, TRIG, CHOLHDL, LDLDIRECT in the last 72 hours. No results for input(s): TSH, T4TOTAL, FREET4, T3FREE, THYROIDAB in the last 72 hours. Sepsis Labs: No results for input(s): PROCALCITON, LATICACIDVEN in the last 168 hours. No results found for this or any previous visit (from the past 240 hours).   Antimicrobials/Microbiology: Anti-infectives (From admission, onward)    None         Component Value Date/Time   SDES EXPECTORATED SPUTUM 03/24/2023 1656   SDES  03/24/2023 1656    EXPECTORATED  SPUTUM Performed at [facility], [address], [place], [place] [address]    SPECREQUEST NONE 03/24/2023 1656   SPECREQUEST  03/24/2023 1656    NONE Reflexed from [id number] Performed at [facility], [address], [place], [place] [address]    CULT  03/24/2023 1656    MODERATE Normal respiratory flora-no Staph aureus or Pseudomonas seen Performed at [facility], [address] [place], [place] [address]    REPTSTATUS 03/24/2023 FINAL 03/24/2023 1656   REPTSTATUS 03/27/2023 FINAL 03/24/2023 1656     Radiology Studies: No results found.    LOS: 0 days   Total time spent in review of labs and imaging, patient evaluation, formulation of plan, documentation and communication with family: 50 minutes  Mennie LAMY, MD  [facility]  04/04/2023, 11:12 AM

## 2023-04-04 NOTE — Progress Notes (Signed)
 NAME:  Mario [name], MRN:  [id number], DOB:  [birth year], LOS: 0 ADMISSION DATE:  04/01/2023, CONSULTATION DATE:  2/3 REFERRING MD:  KC, CHIEF COMPLAINT:  hemoptysis    History of Present Illness:  73 year old male w/ chronic resp failure in setting of COPD (5lpm baseline) remotely seen by Icard (not seen in office > 2 yrs). Admitted from 1/17 to 2/1 w/ what AECOPD in setting of RSV infection and prob CAP after treated w/ ABX and systemic steroids.. was sent home on 5 liters rest and instructed to increase to 6 lpm w/ activity. Apparently when home rested then got up to move, walked up flight of stairs, sats 80s, marked dyspnea, HR 130s. Reported back to the ER that evening (same day of discharge)  In ER  Wbc 12.4, BNP 29/ CXR w/ right basilar band-like atx, and persistent left sided airspace disease.  Continued on oxygen , scheduled BDs ordered, got IV steroids followed by orals.  On 2/1 started having episodes of hemoptysis.  This has been fairly persistent.  It has been bright red, about a teaspoon in size happening fairly frequently at least once every 2 hours and has persisted over night into 2/3 he also continues to have resting dyspnea and thus PCCM asked to eval  Pertinent  Medical History  Chronic resp failure 5 lpm baseline, remote PE (on DOAC), lung cancer s/p XRT, type II DM, HTN, BPH, GAD.grade I diastolic dysfxn ef 60-65% Significant Hospital Events: Including procedures, antibiotic start and stop dates in addition to other pertinent events   2/1 discharged and readmitted following treatment for RSV infection possible And AECOPD placed back on steroids and nebulizers 2/3 pulm consulted for shortness of breath and hemoptysis  Interim History / Subjective:  Still coughing up blood.   Objective   Blood pressure 130/73, pulse 75, temperature 98.2 F (36.8 C), temperature source Oral, resp. rate 18, height 6' 2 (1.88 m), weight 134.5 kg, SpO2 95%.        Intake/Output Summary  (Last 24 hours) at 04/04/2023 1151 Last data filed at 04/04/2023 1039 Gross per 24 hour  Intake 870 ml  Output 2050 ml  Net -1180 ml   Filed Weights   04/01/23 1721 04/03/23 0500 04/04/23 0610  Weight: (!) 141 kg (!) 140 kg 134.5 kg    Examination: General: 73 year old male sitting up in bed no distress wob a little better HENT: NCAT on JVD Lungs: occ wheeze after cough, less accessory  use on 6 lpm still coughing up about dime sized dark older blood about 6-8 events since MN Cardiovascular: rrr Abdomen: soft  Extremities: warm  Neuro: intact  GU: voids  Resolved Hospital Problem list     Assessment & Plan:  Acute on chronic hypoxic resp failure Recent RSV viral infection  Recent CAP (NOS) treated w/ abx AECOPD New hemoptysis  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  He is also had some sinus irritation and some degree of sinusitis show occasional aspiration of blood a possibility as well  plan Hold his DOAC until hemoptysis subsides then resume 24 hrs after.  Cont nasal hygiene as well as optimizing reflux therapy  Chronic hypoxic  resp failure 2/2 COPD s/p recent RSV URI and CAP superimposed on underlying remote lung cancer w/ chronic scarring and volume loss of left lower lobe Plan Continue supplemental oxygen  Continue prednisone  with a slow taper (started back on 40mg ) Continue scheduled bronchodilators Continue supplemental oxygen  Anticipate a prolonged recovery typical of RSV infection  PE (2022) Latest imaging negative Plan Hold DOAC for now w/ hemoptysis   Obstructive sleep apnea On CPAP at home but intolerant of our masks Plan I have encouraged him to  bring his mask from home Resume home CPAP at at bedtime as tolerated  Best Practice (right click and Reselect all SmartList Selections daily)   Per primary    Critical care time: NA

## 2023-04-05 DIAGNOSIS — J9621 Acute and chronic respiratory failure with hypoxia: Secondary | ICD-10-CM | POA: Diagnosis not present

## 2023-04-05 DIAGNOSIS — J440 Chronic obstructive pulmonary disease with acute lower respiratory infection: Secondary | ICD-10-CM | POA: Diagnosis not present

## 2023-04-05 DIAGNOSIS — J449 Chronic obstructive pulmonary disease, unspecified: Secondary | ICD-10-CM | POA: Diagnosis not present

## 2023-04-05 DIAGNOSIS — J209 Acute bronchitis, unspecified: Secondary | ICD-10-CM | POA: Diagnosis not present

## 2023-04-05 LAB — BASIC METABOLIC PANEL
Anion gap: 9 (ref 5–15)
BUN: 17 mg/dL (ref 8–23)
CO2: 26 mmol/L (ref 22–32)
Calcium: 8.7 mg/dL — ABNORMAL LOW (ref 8.9–10.3)
Chloride: 94 mmol/L — ABNORMAL LOW (ref 98–111)
Creatinine, Ser: 0.59 mg/dL — ABNORMAL LOW (ref 0.61–1.24)
GFR, Estimated: >60 mL/min (ref >60)
Glucose, Bld: 138 mg/dL — ABNORMAL HIGH (ref 70–99)
Potassium: 3.9 mmol/L (ref 3.5–5.1)
Sodium: 129 mmol/L — ABNORMAL LOW (ref 135–145)

## 2023-04-05 LAB — CBC
HCT: 41.8 % (ref 39.0–52.0)
Hemoglobin: 13.8 g/dL (ref 13.0–17.0)
MCH: 30.1 pg (ref 26.0–34.0)
MCHC: 33 g/dL (ref 30.0–36.0)
MCV: 91.3 fL (ref 80.0–100.0)
Platelets: 278 10*3/uL (ref 150–400)
RBC: 4.58 MIL/uL (ref 4.22–5.81)
RDW: 13.2 % (ref 11.5–15.5)
WBC: 12.7 10*3/uL — ABNORMAL HIGH (ref 4.0–10.5)
nRBC: 0 % (ref 0.0–0.2)

## 2023-04-05 LAB — GLUCOSE, CAPILLARY
Glucose-Capillary: 127 mg/dL — ABNORMAL HIGH (ref 70–99)
Glucose-Capillary: 179 mg/dL — ABNORMAL HIGH (ref 70–99)
Glucose-Capillary: 167 mg/dL — ABNORMAL HIGH (ref 70–99)
Glucose-Capillary: 174 mg/dL — ABNORMAL HIGH (ref 70–99)

## 2023-04-05 MED ORDER — POLYETHYLENE GLYCOL 3350 17 G PO PACK
17.0000 g | PACK | Freq: Three times a day (TID) | ORAL | Status: DC
  Administered 2023-04-05 – 2023-04-23 (×41): 17 g via ORAL
  Filled 2023-04-05 (×47): qty 1

## 2023-04-05 MED ORDER — CYCLOBENZAPRINE HCL 10 MG PO TABS
10.0000 mg | ORAL_TABLET | Freq: Two times a day (BID) | ORAL | Status: DC | PRN
  Administered 2023-04-05 – 2023-04-23 (×23): 10 mg via ORAL
  Filled 2023-04-05 (×24): qty 1

## 2023-04-05 MED ORDER — POLYETHYLENE GLYCOL 3350 17 G PO PACK: 17.0000 g | PACK | Freq: Two times a day (BID) | ORAL | Status: DC

## 2023-04-05 MED ORDER — MAGNESIUM OXIDE -MG SUPPLEMENT 400 (240 MG) MG PO TABS
200.0000 mg | ORAL_TABLET | Freq: Every day | ORAL | Status: DC
  Administered 2023-04-05 – 2023-04-24 (×19): 200 mg via ORAL
  Filled 2023-04-05 (×20): qty 1

## 2023-04-05 MED ORDER — ADULT MULTIVITAMIN W/MINERALS CH
1.0000 | ORAL_TABLET | Freq: Every day | ORAL | Status: DC
  Administered 2023-04-05 – 2023-04-24 (×19): 1 via ORAL
  Filled 2023-04-05 (×20): qty 1

## 2023-04-05 MED ORDER — HYDROCODONE BIT-HOMATROP MBR 5-1.5 MG/5ML PO SOLN
5.0000 mL | ORAL | Status: DC | PRN
  Administered 2023-04-05 – 2023-04-07 (×6): 5 mL via ORAL
  Filled 2023-04-05 (×6): qty 5

## 2023-04-05 MED ORDER — TRANEXAMIC ACID FOR INHALATION
500.0000 mg | Freq: Three times a day (TID) | RESPIRATORY_TRACT | Status: DC
  Administered 2023-04-05 – 2023-04-08 (×8): 500 mg via RESPIRATORY_TRACT
  Filled 2023-04-05 (×8): qty 10

## 2023-04-05 NOTE — Progress Notes (Signed)
 NAME:  Mario [name], MRN:  [id number], DOB:  06/27/1950, LOS: 1 ADMISSION DATE:  04/01/2023, CONSULTATION DATE:  2/3 REFERRING MD:  KC, CHIEF COMPLAINT:  hemoptysis    History of Present Illness:  73 year old male w/ chronic resp failure in setting of COPD (5lpm baseline) remotely seen by Icard (not seen in office > 2 yrs). Admitted from 1/17 to 2/1 w/ what AECOPD in setting of RSV infection and prob CAP after treated w/ ABX and systemic steroids.. was sent home on 5 liters rest and instructed to increase to 6 lpm w/ activity. Apparently when home rested then got up to move, walked up flight of stairs, sats 80s, marked dyspnea, HR 130s. Reported back to the ER that evening (same day of discharge)  In ER  Wbc 12.4, BNP 29/ CXR w/ right basilar band-like atx, and persistent left sided airspace disease.  Continued on oxygen , scheduled BDs ordered, got IV steroids followed by orals.  On 2/1 started having episodes of hemoptysis.  This has been fairly persistent.  It has been bright red, about a teaspoon in size happening fairly frequently at least once every 2 hours and has persisted over night into 2/3 he also continues to have resting dyspnea and thus PCCM asked to eval  Pertinent  Medical History  Chronic resp failure 5 lpm baseline, remote PE (on DOAC), lung cancer s/p XRT, type II DM, HTN, BPH, GAD.grade I diastolic dysfxn ef 60-65% Significant Hospital Events: Including procedures, antibiotic start and stop dates in addition to other pertinent events   2/1 discharged and readmitted following treatment for RSV infection possible And AECOPD placed back on steroids and nebulizers 2/3 pulm consulted for shortness of breath and hemoptysis  Interim History / Subjective:  Still having hemoptysis. Does not feel substantial improvement over the last three days. Cough suppression limited utlity  Objective   Blood pressure 108/60, pulse 86, temperature 97.9 F (36.6 C), temperature source Oral,  resp. rate 20, height 6' 2 (1.88 m), weight 135.7 kg, SpO2 94%.    FiO2 (%):  [40 %] 40 %   Intake/Output Summary (Last 24 hours) at 04/05/2023 1713 Last data filed at 04/05/2023 9370 Gross per 24 hour  Intake 880 ml  Output 1050 ml  Net -170 ml   Filed Weights   04/03/23 0500 04/04/23 0610 04/05/23 0500  Weight: (!) 140 kg 134.5 kg 135.7 kg    Examination: Elderly man, sitting up in bed On nasal cannula 5L No respiratory distress Breath sounds diminished, no wheeze Frequent coughing No peripheral edema   Resolved Hospital Problem list     Assessment & Plan:  Acute on chronic hypoxic resp failure Recent RSV viral infection  Recent CAP (NOS) treated w/ abx AECOPD New hemoptysis  Pulmonary embolus on DOAC since 2022 CT angio 1/17 was negative for PE  Remote lung cancer s/p XRT to left lung (treated in 2022) OSA Grade I diastolic HF HTN,  T2DM BPH Generalized anxiety   Acute Pulm problem list Hemoptysis  Has been off doac for 72 hours without substantial improvement or resolution.  Discussed bronchoscopy (high risk for respiratory failure if on ventilator) vs CT Chest.  For now will repeat CT Chest with contrast to evaluate for bleeding Consideration for IR embolization of bronchial artery if we feel comfortable localizing side of bleeding from imaging TXA nebs TID  Chronic hypoxic resp failure 2/2 COPD s/p recent RSV URI and CAP superimposed on underlying remote lung cancer w/ chronic scarring  and volume loss of left lower lobe Plan Continue supplemental oxygen  Continue prednisone  with a slow taper (started back on 40mg ) Continue scheduled bronchodilators Continue supplemental oxygen  Anticipate a prolonged recovery typical of RSV infection  PE (2022) Latest imaging negative Plan Hold DOAC for now w/ hemoptysis   Obstructive sleep apnea On CPAP at home but intolerant of our masks Plan I have encouraged him to bring his mask from home Resume home CPAP at  at bedtime as tolerated   Will follow tomorrow after CT Chest.   Verdon Gore, MD Pulmonary and Critical Care Medicine [facility] 04/05/2023 5:16 PM Pager: see AMION  If no response to pager, please call critical care on call (see AMION) until 7pm After 7:00 pm call Elink

## 2023-04-05 NOTE — Progress Notes (Signed)
 PROGRESS NOTE Mario [name]  FMW:[id number] DOB: 1950-08-16 DOA: 04/01/2023 PCP: [name]  Brief Narrative/Hospital Course: 82 yom w/ COPD, chronic hypoxic respiratory failure on 5 L O2 [place], history of PE on Eliquis , history of left lung cancer (s/p radiation), T2DM, HTN, BPH, GAD admitted on 1/17-2/1 for hypoxic respiratory failure/pneumonia/RSV infection and COPD exacerbation presented in the same day of discharge with persistent dyspnea in setting of COPD exacerbation, CAP, RSV infection. In the ED chest x-ray revealed changing x-ray finding of small right effusion, chronic pleural and consolidative process in the left base.  Was hemodynamically stable.   Subjective: Seen and examined this morning  Complains of ongoing coughing and hemoptysis -has not cleared up.   Shortness of breath and oxygen  requirement about the same  Requesting Flexeril  Assessment and Plan: Principal Problem:   COPD with acute bronchitis (HCC) Active Problems:   Chronic respiratory failure with hypoxia, on home O2 therapy (HCC) - 4 L/min   DM2 (diabetes mellitus, type 2) (HCC)   History of pulmonary embolus (PE)   BPH (benign prostatic hyperplasia)   OSA (obstructive sleep apnea)   Hypertension associated with diabetes (HCC)   COPD exacerbation (HCC)   Acute exacerbation of end-stage COPD Acute on chronic hypoxic respiratory failure-on 5 L [place] at baseline CAP/RSV infection Bronchitis Recent admission 1/17- 2/1 for same: Patient with RSV infection and CAP with subsequent COPD exacerbation and hypoxic respiratory failure acute on chronic, readmitted same day after 2 weeks of discharge>imaging overall unchanged.  Will continue current treatment plan with Brovana , Pulmicort  twice daily, prednisone  40 mg, bronchodilators antitussives, supplemental oxygen  IS ptot.  Having ongoing hemoptysis Initial leukocytosis resolved, BNP and troponin negative x 2.  History of PE: Remote history of PE on Eliquis -  but given his history of lung cancer-was maintained on Eliquis . Eliquis  discontinued due to hemoptysis for now.CT angio chest on last admission 1/17 no PE  Hemoptysis  Hemoptysis due to DOAC in the setting of CAP/RSV infection.  Still having hemoptysis with ongoing cough.  Eliquis  has been discontinued for now-will need to keep of at least for few weeks.  Change antitussive  to Hycodan every 4 hours as needed, continue scheduled Mucinex , antitussive  and steroid  Lung cancer: Not currently on treatment   Type 2 diabetes: With uncontrolled hyperglycemia blood sugar at time of 240s but controlled currently on sliding scale.  Hold metformin   Recent Labs  Lab 04/04/23 0737 04/04/23 1320 04/04/23 1648 04/04/23 2203 04/05/23 0742  GLUCAP 137* 210* 188* 126* 127*   Hyponatremia: Subacute, asymptomatic.  Monitor  Hypertension: BP is controlled on Diltiazem  180 mg daily.   BPH: Continue Flomax .   GAD/insomnia: Mood stable continue bupropion , BuSpar , Cymbalta , Trileptal , trazodone .  Resume his Flexeril   Hyponatremia: Resolved    OSA: CPAP nightly.  Obesity: Patient's Body mass index is 38.41 kg/m. : Will benefit with PCP follow-up, weight loss  healthy lifestyle and outpatient sleep evaluation.   DVT prophylaxis:  Code Status:   Code Status: Full Code Family Communication: plan of care discussed with patient at bedside. Patient status is: Remains hospitalized because of severity of illness Level of care: Med-Surg   Dispo: The patient is from: home with a roommate            Anticipated disposition: Home once notices improves  1-2 days  objective: Vitals last 24 hrs: Vitals:   04/05/23 0500 04/05/23 0547 04/05/23 0950 04/05/23 1046  BP:  (!) 113/58    Pulse:  71  Resp:  20    Temp:  97.6 F (36.4 C)    TempSrc:  Oral    SpO2:  95% 97% 96%  Weight: 135.7 kg     Height:       Weight change: 1.2 kg  Physical Examination: General exam: alert awake, oriented  obese HEENT:Oral mucosa moist, Ear/Nose WNL grossly Respiratory system: Bilaterally diminished  BS,no use of accessory muscle Cardiovascular system: S1 & S2 +, No JVD. Gastrointestinal system: Abdomen soft,NT,ND, BS+ Nervous System: Alert, awake, moving all extremities,and following commands. Extremities: LE edema neg,distal peripheral pulses palpable and warm.  Skin: No rashes,no icterus. MSK: Normal muscle bulk,tone, power    Medications reviewed:  Scheduled Meds:  arformoterol   15 mcg Nebulization BID   ascorbic acid   500 mg Oral Daily   aspirin  EC  81 mg Oral Daily   azelastine   2 spray Each Nare BID   budesonide  (PULMICORT ) nebulizer solution  0.25 mg Nebulization BID   buPROPion   300 mg Oral q morning   busPIRone   15 mg Oral Daily   cholecalciferol   1,000 Units Oral Daily   diltiazem   180 mg Oral Daily   docusate sodium   200 mg Oral BID   DULoxetine   60 mg Oral BID   fluticasone   2 spray Each Nare Daily   folic acid   1 mg Oral Daily   gabapentin   100 mg Oral TID   guaiFENesin   1,200 mg Oral BID   insulin  aspart  0-5 Units Subcutaneous QHS   insulin  aspart  0-9 Units Subcutaneous TID WC   magnesium  oxide  200 mg Oral Daily   melatonin  3 mg Oral QHS   Multivitamin Adult   Oral Daily   Oxcarbazepine   600 mg Oral TID   pantoprazole   40 mg Oral BID   predniSONE   40 mg Oral Q breakfast   revefenacin   175 mcg Nebulization Daily   sodium chloride   2 spray Each Nare BID   tamsulosin   0.8 mg Oral Daily   traZODone   75 mg Oral QHS   Continuous Infusions:   Diet Order             Diet heart healthy/carb modified Room service appropriate? Yes; Fluid consistency: Thin  Diet effective now                  Intake/Output Summary (Last 24 hours) at 04/05/2023 1110 Last data filed at 04/05/2023 0629 Gross per 24 hour  Intake 880 ml  Output 1750 ml  Net -870 ml   Net IO Since Admission: -1,450 mL [04/05/23 1110]  Wt Readings from Last 3 Encounters:  04/05/23 135.7 kg   03/17/23 (!) 141.3 kg  01/26/23 127 kg     Unresulted Labs (From admission, onward)     Start     Ordered   04/04/23 0500  Basic metabolic panel  Daily,   R      04/03/23 0956          Data Reviewed: I have personally reviewed following labs and imaging studies CBC: Recent Labs  Lab 04/01/23 0604 04/01/23 1840 04/02/23 0924 04/04/23 0508 04/05/23 0458  WBC 12.4* 13.7* 10.9* 10.2 12.7*  NEUTROABS  --  10.4*  --   --   --   HGB 12.9* 14.0 14.4 12.8* 13.8  HCT 40.2 42.0 42.7 38.9* 41.8  MCV 95.3 91.5 91.6 92.4 91.3  PLT 260 292 314 264 278   Basic Metabolic Panel:  Recent Labs  Lab 03/30/23  9474 03/31/23 9547 04/01/23 0604 04/01/23 1840 04/02/23 0924 04/04/23 0508 04/05/23 0458  NA 132* 129* 132* 130* 135 133* 129*  K 3.9 3.6 4.1 4.2 4.8 4.0 3.9  CL 98 94* 98 96* 99 99 94*  CO2 26 26 26 25 25 26 26   GLUCOSE 157* 138* 133* 156* 184* 163* 138*  BUN 16 18 19 18 15 17 17   CREATININE 0.75 0.92 0.90 0.75 0.60* 0.75 0.59*  CALCIUM  8.2* 8.5* 8.3* 8.6* 8.8* 8.4* 8.7*  MG 2.3 2.4 2.2  --   --   --   --   PHOS  --  3.7  --   --   --   --   --    GFR: Estimated Creatinine Clearance: 124.1 mL/min (A) (by C-G formula based on SCr of 0.59 mg/dL (L)). Liver Function Tests:  Recent Labs  Lab 03/30/23 0525 04/01/23 0604 04/01/23 1840  AST 15 15 18   ALT 19 17 18   ALKPHOS 58 58 65  BILITOT 0.5 0.6 0.6  PROT 6.2* 6.1* 6.9  ALBUMIN 3.2* 3.0* 3.7   No results for input(s): HGBA1C in the last 72 hours. Recent Labs  Lab 04/04/23 0737 04/04/23 1320 04/04/23 1648 04/04/23 2203 04/05/23 0742  GLUCAP 137* 210* 188* 126* 127*   No results for input(s): CHOL, HDL, LDLCALC, TRIG, CHOLHDL, LDLDIRECT in the last 72 hours. No results for input(s): TSH, T4TOTAL, FREET4, T3FREE, THYROIDAB in the last 72 hours. Sepsis Labs: No results for input(s): PROCALCITON, LATICACIDVEN in the last 168 hours. No results found for this or any previous visit (from  the past 240 hours).   Antimicrobials/Microbiology: Anti-infectives (From admission, onward)    None         Component Value Date/Time   SDES EXPECTORATED SPUTUM 03/24/2023 1656   SDES  03/24/2023 1656    EXPECTORATED SPUTUM Performed at [facility], [address] [place], [place] [address]    SPECREQUEST NONE 03/24/2023 1656   SPECREQUEST  03/24/2023 1656    NONE Reflexed from [id number] Performed at [facility], [address] [place], [place] [address]    CULT  03/24/2023 1656    MODERATE Normal respiratory flora-no Staph aureus or Pseudomonas seen Performed at [facility], [address] [place], [place] [address]    REPTSTATUS 03/24/2023 FINAL 03/24/2023 1656   REPTSTATUS 03/27/2023 FINAL 03/24/2023 1656     Radiology Studies: No results found.    LOS: 1 day   Total time spent in review of labs and imaging, patient evaluation, formulation of plan, documentation and communication with family: 50 minutes  Mennie LAMY, MD  [facility]  04/05/2023, 11:10 AM

## 2023-04-05 NOTE — Progress Notes (Addendum)
 Physical Therapy Treatment Patient Details Name: Mario [name] MRN: [id number] DOB: 31-Oct-1950 Today's Date: 04/05/2023   History of Present Illness 73 y.o. male presents to the ED for evaluation of shortness of breath. pt was discharged earlier same day after being admitted initially on 03/17/2023 for acute on chronic hypoxic respiratory failure in the setting of community-acquired pneumonia, RSV infection, and COPD exacerbation.    medical history significant for COPD, chronic hypoxic respiratory failure on 5 L O2 , history of PE on Eliquis , history of left lung cancer (s/p radiation), T2DM, HTN, BPH, GAD    PT Comments  Pt supine upon arrival, reports I feel like crap but agreeable to ambulation. Pt also noted to have continued hemoptysis. Pt ambulates 50 ft, 40 ft, 40 ft, 10 ft and then 15 ft, standing rest breaks between each ambulation bout, no overt LOB, no drifting, good bil foot clearance. Dyspnea noted and pt with coughing throughout session- Pt on 5L upon arrival with SpO2 99% and HR 90; placed on 6L with ambulation due to tank constraints, SpO2 92-98% and HR 110 max noted. Pt has personal pulse ox in room, monitoring HR and SpO2, good understanding of values and demonstrates good energy conservation techniques.   If plan is discharge home, recommend the following: Help with stairs or ramp for entrance   Can travel by private vehicle        Equipment Recommendations  None recommended by PT    Recommendations for Other Services       Precautions / Restrictions Precautions Precautions: Fall Precaution Comments: monitor O2 and HR/5L at baseline Restrictions Weight Bearing Restrictions Per Provider Order: No     Mobility  Bed Mobility Overal bed mobility: Independent                  Transfers Overall transfer level: Modified independent                 General transfer comment: hands assisting to transfer, self managing O2 tubing     Ambulation/Gait Ambulation/Gait assistance: Supervision Gait Distance (Feet): 50 Feet (+additional 40, 10, 15) Assistive device: None Gait Pattern/deviations: Step-through pattern Gait velocity: decreased     General Gait Details: step through gait pattern, no drifting or LOB noted, dyspnea noted, on 6L due to tank constraints, standing rest breaks between each ambulation bout, SpO2 98-92% and HR max 110 noted   Stairs             Wheelchair Mobility     Tilt Bed    Modified Rankin (Stroke Patients Only)       Balance Overall balance assessment: No apparent balance deficits (not formally assessed)                                          Cognition Arousal: Alert Behavior During Therapy: WFL for tasks assessed/performed Overall Cognitive Status: Within Functional Limits for tasks assessed                                 General Comments: pt I feel like crap, continued hemoptysis        Exercises      General Comments General comments (skin integrity, edema, etc.): Pt on 5L upon arrival with SpO2 99% and HR 90; placed on 6L with ambulation due to tank constraints,  SpO2 92-98% and HR 110 max noted      Pertinent Vitals/Pain Pain Assessment Pain Assessment: No/denies pain    Home Living                          Prior Function            PT Goals (current goals can now be found in the care plan section) Acute Rehab PT Goals PT Goal Formulation: With patient Time For Goal Achievement: 04/16/23 Potential to Achieve Goals: Good    Frequency    Min 1X/week      PT Plan      Co-evaluation              AM-PAC PT 6 Clicks Mobility   Outcome Measure  Help needed turning from your back to your side while in a flat bed without using bedrails?: None Help needed moving from lying on your back to sitting on the side of a flat bed without using bedrails?: None Help needed moving to and from a bed  to a chair (including a wheelchair)?: A Little Help needed standing up from a chair using your arms (e.g., wheelchair or bedside chair)?: None Help needed to walk in hospital room?: A Little Help needed climbing 3-5 steps with a railing? : A Little 6 Click Score: 21    End of Session Equipment Utilized During Treatment: Oxygen  Activity Tolerance: Patient tolerated treatment well Patient left: Other (comment) (in restroom seated on toilet, aware of call light pull cord, RN aware) Nurse Communication: Mobility status;Other (comment) (in restroom) PT Visit Diagnosis: Other abnormalities of gait and mobility (R26.89)     Time: 8993-8978 PT Time Calculation (min) (ACUTE ONLY): 15 min  Charges:    $Gait Training: 8-22 mins PT General Charges $$ ACUTE PT VISIT: 1 Visit                     Tori Arminta Gamm PT, DPT 04/05/23, 10:30 AM

## 2023-04-05 NOTE — Plan of Care (Signed)
   Problem: Clinical Measurements: Goal: Ability to maintain clinical measurements within normal limits will improve Outcome: Progressing

## 2023-04-06 ENCOUNTER — Inpatient Hospital Stay (HOSPITAL_COMMUNITY): Payer: No Typology Code available for payment source

## 2023-04-06 DIAGNOSIS — R042 Hemoptysis: Secondary | ICD-10-CM

## 2023-04-06 DIAGNOSIS — J209 Acute bronchitis, unspecified: Secondary | ICD-10-CM | POA: Diagnosis not present

## 2023-04-06 DIAGNOSIS — J440 Chronic obstructive pulmonary disease with acute lower respiratory infection: Secondary | ICD-10-CM | POA: Diagnosis not present

## 2023-04-06 LAB — GLUCOSE, CAPILLARY
Glucose-Capillary: 126 mg/dL — ABNORMAL HIGH (ref 70–99)
Glucose-Capillary: 140 mg/dL — ABNORMAL HIGH (ref 70–99)
Glucose-Capillary: 208 mg/dL — ABNORMAL HIGH (ref 70–99)
Glucose-Capillary: 170 mg/dL — ABNORMAL HIGH (ref 70–99)

## 2023-04-06 LAB — BASIC METABOLIC PANEL
Anion gap: 10 (ref 5–15)
BUN: 18 mg/dL (ref 8–23)
CO2: 26 mmol/L (ref 22–32)
Calcium: 8.7 mg/dL — ABNORMAL LOW (ref 8.9–10.3)
Chloride: 94 mmol/L — ABNORMAL LOW (ref 98–111)
Creatinine, Ser: 0.8 mg/dL (ref 0.61–1.24)
GFR, Estimated: >60 mL/min (ref >60)
Glucose, Bld: 126 mg/dL — ABNORMAL HIGH (ref 70–99)
Potassium: 3.9 mmol/L (ref 3.5–5.1)
Sodium: 130 mmol/L — ABNORMAL LOW (ref 135–145)

## 2023-04-06 LAB — PROCALCITONIN: Procalcitonin: <0.1 ng/mL

## 2023-04-06 MED ORDER — IOHEXOL 300 MG/ML  SOLN
75.0000 mL | Freq: Once | INTRAMUSCULAR | Status: AC | PRN
  Administered 2023-04-06: 75 mL via INTRAVENOUS

## 2023-04-06 NOTE — Progress Notes (Signed)
 PROGRESS NOTE Mario [name]  FMW:[id number] DOB: 1950-11-04 DOA: 04/01/2023 [facility]: [name]  Brief Narrative/Hospital Course: 67 yom w/ COPD, chronic hypoxic respiratory failure on 5 L O2 [place], history of PE on Eliquis , history of left lung cancer (s/p radiation), T2DM, HTN, BPH, GAD admitted on 1/17-2/1 for hypoxic respiratory failure/pneumonia/RSV infection and COPD exacerbation presented in the same day of discharge with persistent dyspnea in setting of COPD exacerbation, CAP, RSV infection. In the ED chest x-ray revealed changing x-ray finding of small right effusion, chronic pleural and consolidative process in the left base.  Was hemodynamically stable.   Subjective: Still c/o hemoptysis and had CT chest   Assessment and Plan: Principal Problem:   COPD with acute bronchitis (HCC) Active Problems:   Chronic respiratory failure with hypoxia, on home O2 therapy (HCC) - 4 L/min   DM2 (diabetes mellitus, type 2) (HCC)   History of pulmonary embolus (PE)   BPH (benign prostatic hyperplasia)   OSA (obstructive sleep apnea)   Hypertension associated with diabetes (HCC)   COPD exacerbation (HCC)   Acute exacerbation of end-stage COPD Acute on chronic hypoxic respiratory failure-on 5 L [place] at baseline CAP/RSV infection Bronchitis Recent admission 1/17- 2/1 for same: Patient with RSV infection and CAP with subsequent COPD exacerbation and hypoxic respiratory failure acute on chronic, readmitted same day after 2 weeks of discharge>imaging overall unchanged.Initial leukocytosis resolved, BNP and troponin negative x 2. Respiratory status overall stable with chronic hypoxia, Will continue current treatment plan with Brovana , Pulmicort  twice daily, prednisone  40 mg, bronchodilators antitussives, supplemental oxygen  IS ptot.    History of PE: Remote history of PE on Eliquis - but given his history of lung cancer-was maintained on Eliquis . Eliquis  discontinued due to hemoptysis for  now.CT angio chest on last admission 1/17 no PE  Hemoptysis  Hemoptysis likely due to DOAC in the setting of CAP/RSV infection.  Still with ongoing hemoptysis, getting CT chest this morning to see if we can localize and possibly get IR embolization.  TXA nebs 3 times daily to continue, continue antitussives.  Eliquis  discontinued  Lung cancer: Not currently on treatment   Type 2 diabetes: With uncontrolled hyperglycemia blood sugar at time of 240s but controlled currently on sliding scale.  Hold metformin   Recent Labs  Lab 04/05/23 1205 04/05/23 1642 04/05/23 2136 04/06/23 0747 04/06/23 1145  GLUCAP 179* 167* 174* 126* 140*   Hypertension: BP stable on home Diltiazem  180 mg daily.   BPH: Continue Flomax .   GAD/insomnia: Mood stable.  Continue home bupropion , BuSpar , Cymbalta , Trileptal , trazodone .  Resume his Flexeril   Hyponatremia: Resolved    OSA: CPAP nightly.  Obesity: Patient's Body mass index is 38.41 kg/m. : Will benefit with PCP follow-up, weight loss  healthy lifestyle and outpatient sleep evaluation.   DVT prophylaxis: Place and maintain sequential compression device Start: 04/05/23 1342 Code Status:   Code Status: Full Code Family Communication: plan of care discussed with patient at bedside. Patient status is: Remains hospitalized because of severity of illness Level of care: Med-Surg   Dispo: The patient is from: home with a roommate            Anticipated disposition: TBD  objective: Vitals last 24 hrs: Vitals:   04/05/23 2139 04/06/23 0505 04/06/23 0548 04/06/23 0926  BP:   (!) 115/59   Pulse:   76   Resp:   20   Temp:   98.8 F (37.1 C)   TempSrc:      SpO2: 95% 95%  97% 90%  Weight:      Height:       Weight change:   Physical Examination: General exam: alert awake, oriented, obese, pleasant  HEENT:Oral mucosa moist, Ear/Nose WNL grossly Respiratory system: Bilaterally diminished breath sounds, no use of accessory  muscles Cardiovascular system: S1 & S2 +, No JVD. Gastrointestinal system: Abdomen soft,NT,ND, BS+ Nervous System: Alert, awake, moving all extremities,and following commands. Extremities: LE edema NEG,distal peripheral pulses palpable and warm.  Skin: No rashes,no icterus. MSK: Normal muscle bulk tone and power  Medications reviewed:  Scheduled Meds:  arformoterol   15 mcg Nebulization BID   ascorbic acid   500 mg Oral Daily   aspirin  EC  81 mg Oral Daily   azelastine   2 spray Each Nare BID   budesonide  (PULMICORT ) nebulizer solution  0.25 mg Nebulization BID   buPROPion   300 mg Oral q morning   busPIRone   15 mg Oral Daily   cholecalciferol   1,000 Units Oral Daily   diltiazem   180 mg Oral Daily   docusate sodium   200 mg Oral BID   DULoxetine   60 mg Oral BID   fluticasone   2 spray Each Nare Daily   folic acid   1 mg Oral Daily   gabapentin   100 mg Oral TID   guaiFENesin   1,200 mg Oral BID   insulin  aspart  0-5 Units Subcutaneous QHS   insulin  aspart  0-9 Units Subcutaneous TID WC   magnesium  oxide  200 mg Oral Daily   melatonin  3 mg Oral QHS   multivitamin with minerals  1 tablet Oral Daily   Oxcarbazepine   600 mg Oral TID   pantoprazole   40 mg Oral BID   polyethylene glycol  17 g Oral TID   predniSONE   40 mg Oral Q breakfast   revefenacin   175 mcg Nebulization Daily   sodium chloride   2 spray Each Nare BID   tamsulosin   0.8 mg Oral Daily   tranexamic acid   500 mg Nebulization Q8H   traZODone   75 mg Oral QHS   Continuous Infusions:   Diet Order             Diet heart healthy/carb modified Room service appropriate? Yes; Fluid consistency: Thin  Diet effective now                  Intake/Output Summary (Last 24 hours) at 04/06/2023 1203 Last data filed at 04/06/2023 1000 Gross per 24 hour  Intake 1080 ml  Output 1700 ml  Net -620 ml   Net IO Since Admission: -2,070 mL [04/06/23 1203]  Wt Readings from Last 3 Encounters:  04/05/23 135.7 kg  03/17/23 (!) 141.3 kg   01/26/23 127 kg     Unresulted Labs (From admission, onward)     Start     Ordered   04/04/23 0500  Basic metabolic panel  Daily,   R      04/03/23 0956          Data Reviewed: I have personally reviewed following labs and imaging studies CBC: Recent Labs  Lab 04/01/23 0604 04/01/23 1840 04/02/23 0924 04/04/23 0508 04/05/23 0458  WBC 12.4* 13.7* 10.9* 10.2 12.7*  NEUTROABS  --  10.4*  --   --   --   HGB 12.9* 14.0 14.4 12.8* 13.8  HCT 40.2 42.0 42.7 38.9* 41.8  MCV 95.3 91.5 91.6 92.4 91.3  PLT 260 292 314 264 278   Basic Metabolic Panel:  Recent Labs  Lab 03/31/23 0452 04/01/23 0604  04/01/23 1840 04/02/23 0924 04/04/23 0508 04/05/23 0458 04/06/23 0510  NA 129* 132* 130* 135 133* 129* 130*  K 3.6 4.1 4.2 4.8 4.0 3.9 3.9  CL 94* 98 96* 99 99 94* 94*  CO2 26 26 25 25 26 26 26   GLUCOSE 138* 133* 156* 184* 163* 138* 126*  BUN 18 19 18 15 17 17 18   CREATININE 0.92 0.90 0.75 0.60* 0.75 0.59* 0.80  CALCIUM  8.5* 8.3* 8.6* 8.8* 8.4* 8.7* 8.7*  MG 2.4 2.2  --   --   --   --   --   PHOS 3.7  --   --   --   --   --   --    GFR: Estimated Creatinine Clearance: 124.1 mL/min (by C-G formula based on SCr of 0.8 mg/dL). Liver Function Tests:  Recent Labs  Lab 04/01/23 0604 04/01/23 1840  AST 15 18  ALT 17 18  ALKPHOS 58 65  BILITOT 0.6 0.6  PROT 6.1* 6.9  ALBUMIN 3.0* 3.7   No results for input(s): HGBA1C in the last 72 hours. Recent Labs  Lab 04/05/23 1205 04/05/23 1642 04/05/23 2136 04/06/23 0747 04/06/23 1145  GLUCAP 179* 167* 174* 126* 140*   No results for input(s): CHOL, HDL, LDLCALC, TRIG, CHOLHDL, LDLDIRECT in the last 72 hours. No results for input(s): TSH, T4TOTAL, FREET4, T3FREE, THYROIDAB in the last 72 hours. Sepsis Labs: No results for input(s): PROCALCITON, LATICACIDVEN in the last 168 hours. No results found for this or any previous visit (from the past 240 hours).    Antimicrobials/Microbiology: Anti-infectives (From admission, onward)    None         Component Value Date/Time   SDES EXPECTORATED SPUTUM 03/24/2023 1656   SDES  03/24/2023 1656    EXPECTORATED SPUTUM Performed at [facility], [address] [place], [place] [address]    SPECREQUEST NONE 03/24/2023 1656   SPECREQUEST  03/24/2023 1656    NONE Reflexed from [id number] Performed at [facility], [address] [place], [place] [address]    CULT  03/24/2023 1656    MODERATE Normal respiratory flora-no Staph aureus or Pseudomonas seen Performed at [facility], [address], [place], [place] [address]    REPTSTATUS 03/24/2023 FINAL 03/24/2023 1656   REPTSTATUS 03/27/2023 FINAL 03/24/2023 1656     Radiology Studies: No results found.    LOS: 2 days   Total time spent in review of labs and imaging, patient evaluation, formulation of plan, documentation and communication with family: 50 minutes  Mennie LAMY, MD  [facility]  04/06/2023, 12:03 PM

## 2023-04-06 NOTE — Progress Notes (Addendum)
 NAME:  Mario [name], MRN:  [id number], DOB:  01-11-1951, LOS: 2 ADMISSION DATE:  04/01/2023, CONSULTATION DATE:  2/3 REFERRING MD:  KC, CHIEF COMPLAINT:  hemoptysis    History of Present Illness:  73 year old male w/ chronic resp failure in setting of COPD (5lpm baseline) remotely seen by Icard (not seen in office > 2 yrs). Admitted from 1/17 to 2/1 w/ what AECOPD in setting of RSV infection and prob CAP after treated w/ ABX and systemic steroids.. was sent home on 5 liters rest and instructed to increase to 6 lpm w/ activity. Apparently when home rested then got up to move, walked up flight of stairs, sats 80s, marked dyspnea, HR 130s. Reported back to the ER that evening (same day of discharge)  In ER  Wbc 12.4, BNP 29/ CXR w/ right basilar band-like atx, and persistent left sided airspace disease.  Continued on oxygen , scheduled BDs ordered, got IV steroids followed by orals.  On 2/1 started having episodes of hemoptysis.  This has been fairly persistent.  It has been bright red, about a teaspoon in size happening fairly frequently at least once every 2 hours and has persisted over night into 2/3 he also continues to have resting dyspnea and thus PCCM asked to eval  Pertinent  Medical History  Chronic resp failure 5 lpm baseline, remote PE (on DOAC), lung cancer s/p XRT, type II DM, HTN, BPH, GAD.grade I diastolic dysfxn ef 60-65% Significant Hospital Events: Including procedures, antibiotic start and stop dates in addition to other pertinent events   2/1 discharged and readmitted following treatment for RSV infection possible And AECOPD placed back on steroids and nebulizers 2/3 pulm consulted for shortness of breath and hemoptysis  Interim History / Subjective:  Still having hemoptysis from 1 tsp to 1tbsp in amount a couple of times a day. This is bright red blood. Doesn't seem to have slowed down.   Objective   Blood pressure 138/78, pulse 98, temperature 97.7 F (36.5 C), temperature  source Oral, resp. rate 18, height 6' 2 (1.88 m), weight 135.7 kg, SpO2 96%.    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  Repeated a CT chest is stable. 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  Limited endobronchial options given his high oxygen  requirements at [facility]. No good endobronchial options for  diffuse disease either. Consider systemic tranexamic acid  if still bleeding tomorrow? For now continue TXA nebs TID. Hopefully this continues to slow down.   Chronic hypoxic resp failure 2/2 COPD s/p recent RSV URI and CAP superimposed on underlying remote lung cancer w/ chronic scarring and volume loss of left lower lobe Plan Continue supplemental oxygen  Continue prednisone  with a slow taper (started back on 40mg ) Continue scheduled bronchodilators Continue supplemental oxygen  Anticipate a prolonged recovery typical of RSV infection  PE  (2022) Plan Hold DOAC for now w/ hemoptysis   Obstructive sleep apnea CPAP   PCCM will follow.   Verdon Gore, MD Pulmonary and Critical Care Medicine [place] [facility] 04/06/2023 6:28 PM Pager: see AMION  If no response to pager, please call critical care on call (see AMION) until 7pm After 7:00 pm call Elink

## 2023-04-06 NOTE — Plan of Care (Signed)
  Problem: Education: Goal: Ability to describe self-care measures that may prevent or decrease complications (Diabetes Survival Skills Education) will improve Outcome: Progressing   Problem: Activity: Goal: Risk for activity intolerance will decrease Outcome: Progressing

## 2023-04-06 NOTE — Plan of Care (Signed)
   Problem: Nutritional: Goal: Maintenance of adequate nutrition will improve Outcome: Progressing

## 2023-04-06 NOTE — Progress Notes (Signed)
 Mobility Specialist - Progress Note   04/06/23 0926  Oxygen  Therapy  SpO2 90 %  O2 Device Nasal Cannula  O2 Flow Rate (L/min) 6 L/min  Patient Activity (if Appropriate) Ambulating  Mobility  Activity Ambulated independently in hallway  Level of Assistance Independent  Assistive Device None  Distance Ambulated (ft) 160 ft  Activity Response Tolerated well  Mobility Referral Yes  Mobility visit 1 Mobility  Mobility Specialist Start Time (ACUTE ONLY) [id number]  Mobility Specialist Stop Time (ACUTE ONLY) 0925  Mobility Specialist Time Calculation (min) (ACUTE ONLY) 9 min   Pt received in bed and agreeable to mobility. Ambulated pt on 6L d/t tank restrictions. Pt took x1 standing rest break d/t SOB. C/o feeling extremely SOB throughout session. No other complaints during session. Pt to EOB after session with all needs met.    Pre-mobility: 96 HR, 95% SpO2 (6L [place]) During mobility: 111 HR, 90% SpO2 (6L [place]) Post-mobility: 110 HR, 93% SPO2 (6L [place])  Chief Technology Officer

## 2023-04-07 DIAGNOSIS — J440 Chronic obstructive pulmonary disease with acute lower respiratory infection: Secondary | ICD-10-CM | POA: Diagnosis not present

## 2023-04-07 DIAGNOSIS — J209 Acute bronchitis, unspecified: Secondary | ICD-10-CM | POA: Diagnosis not present

## 2023-04-07 DIAGNOSIS — J9611 Chronic respiratory failure with hypoxia: Secondary | ICD-10-CM

## 2023-04-07 LAB — CBC
HCT: 39.2 % (ref 39.0–52.0)
Hemoglobin: 12.8 g/dL — ABNORMAL LOW (ref 13.0–17.0)
MCH: 30 pg (ref 26.0–34.0)
MCHC: 32.7 g/dL (ref 30.0–36.0)
MCV: 92 fL (ref 80.0–100.0)
Platelets: 263 10*3/uL (ref 150–400)
RBC: 4.26 MIL/uL (ref 4.22–5.81)
RDW: 13.2 % (ref 11.5–15.5)
WBC: 10.1 10*3/uL (ref 4.0–10.5)
nRBC: 0 % (ref 0.0–0.2)

## 2023-04-07 LAB — BASIC METABOLIC PANEL
Anion gap: 8 (ref 5–15)
BUN: 15 mg/dL (ref 8–23)
CO2: 27 mmol/L (ref 22–32)
Calcium: 8.4 mg/dL — ABNORMAL LOW (ref 8.9–10.3)
Chloride: 96 mmol/L — ABNORMAL LOW (ref 98–111)
Creatinine, Ser: 0.76 mg/dL (ref 0.61–1.24)
GFR, Estimated: >60 mL/min (ref >60)
Glucose, Bld: 157 mg/dL — ABNORMAL HIGH (ref 70–99)
Potassium: 3.7 mmol/L (ref 3.5–5.1)
Sodium: 131 mmol/L — ABNORMAL LOW (ref 135–145)

## 2023-04-07 LAB — GLUCOSE, CAPILLARY
Glucose-Capillary: 138 mg/dL — ABNORMAL HIGH (ref 70–99)
Glucose-Capillary: 139 mg/dL — ABNORMAL HIGH (ref 70–99)
Glucose-Capillary: 225 mg/dL — ABNORMAL HIGH (ref 70–99)
Glucose-Capillary: 165 mg/dL — ABNORMAL HIGH (ref 70–99)

## 2023-04-07 MED ORDER — ACETAMINOPHEN-CODEINE 120-12 MG/5ML PO SOLN
5.0000 mL | Freq: Once | ORAL | Status: DC
  Filled 2023-04-07 (×2): qty 5

## 2023-04-07 MED ORDER — LACTULOSE ENEMA
300.0000 mL | Freq: Once | ORAL | Status: AC
  Administered 2023-04-07: 300 mL via RECTAL
  Filled 2023-04-07: qty 300

## 2023-04-07 MED ORDER — HYDROCOD POLI-CHLORPHE POLI ER 10-8 MG/5ML PO SUER
5.0000 mL | Freq: Two times a day (BID) | ORAL | Status: DC | PRN
  Administered 2023-04-07 – 2023-04-08 (×2): 5 mL via ORAL
  Filled 2023-04-07 (×2): qty 5

## 2023-04-07 MED ORDER — ACETAMINOPHEN-CODEINE 120-12 MG/5ML PO SOLN: 5.0000 mL | ORAL | Status: DC | PRN

## 2023-04-07 NOTE — Progress Notes (Signed)
 NAME:  Mario [name], MRN:  [id number], DOB:  1950/09/30, LOS: 3 ADMISSION DATE:  04/01/2023, CONSULTATION DATE:  2/3 REFERRING MD:  KC, CHIEF COMPLAINT:  hemoptysis    History of Present Illness:  73 year old male w/ chronic resp failure in setting of COPD (5lpm baseline) remotely seen by Icard (not seen in office > 2 yrs). Admitted from 1/17 to 2/1 w/ what AECOPD in setting of RSV infection and prob CAP after treated w/ ABX and systemic steroids.. was sent home on 5 liters rest and instructed to increase to 6 lpm w/ activity. Apparently when home rested then got up to move, walked up flight of stairs, sats 80s, marked dyspnea, HR 130s. Reported back to the ER that evening (same day of discharge)  In ER  Wbc 12.4, BNP 29/ CXR w/ right basilar band-like atx, and persistent left sided airspace disease.  Continued on oxygen , scheduled BDs ordered, got IV steroids followed by orals.  On 2/1 started having episodes of hemoptysis.  This has been fairly persistent.  It has been bright red, about a teaspoon in size happening fairly frequently at least once every 2 hours and has persisted over night into 2/3 he also continues to have resting dyspnea and thus PCCM asked to eval  Pertinent  Medical History  Chronic resp failure 5 lpm baseline, remote PE (on DOAC), lung cancer s/p XRT, type II DM, HTN, BPH, GAD.grade I diastolic dysfxn ef 60-65% Significant Hospital Events: Including procedures, antibiotic start and stop dates in addition to other pertinent events   2/1 discharged and readmitted following treatment for RSV infection possible And AECOPD placed back on steroids and nebulizers 2/3 pulm consulted for shortness of breath and hemoptysis  Interim History / Subjective:  Has had 1 tsp to 1 tbsp of frank blood sputum this week. Started on TID TXA nebs starting two days ago and cough syrup. Last hemoptysis yesterday. Nothing this morning but cough syrup has suppressed his cough and feels congested  and junky  Asking about systemic TXA   Objective   Blood pressure 138/73, pulse 72, temperature 97.7 F (36.5 C), temperature source Oral, resp. rate 18, height 6' 2 (1.88 m), weight 135.7 kg, SpO2 97%.    FiO2 (%):  [40 %] 40 %   Intake/Output Summary (Last 24 hours) at 04/07/2023 0856 Last data filed at 04/07/2023 9379 Gross per 24 hour  Intake 1720 ml  Output 2250 ml  Net -530 ml   Filed Weights   04/03/23 0500 04/04/23 0610 04/05/23 0500  Weight: (!) 140 kg 134.5 kg 135.7 kg   Physical Exam: General: Well-appearing, no acute distress HENT: [place], AT Eyes: EOMI, no scleral icterus Respiratory: Central rhonchi, no wheezing Cardiovascular: RRR, -M/R/G, no JVD Extremities:-Edema,-tenderness Neuro: AAO x4, CNII-XII grossly intact Psych: Normal mood, normal affect    Resolved Hospital Problem list     Assessment & Plan:  Acute on chronic hypoxic resp failure Recent RSV viral infection  Recent CAP (NOS) treated w/ abx AECOPD New hemoptysis  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  Repeated a CT chest is stable. 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  Limited endobronchial options given his high oxygen  requirements at [facility]. No good endobronchial options for diffuse disease either. Consider systemic tranexamic acid  if still bleeding tomorrow? Started on 2/6 TXA nebs TID 2/7 No episodes of hemoptysis today however has significant rhonchi and will likely produce sputum later today -Continue TXA nebs TID, hycodan -Will  investigate if systemic TXA is an option  Chronic hypoxic resp failure 2/2 COPD s/p recent RSV URI and CAP superimposed on underlying remote lung cancer w/ chronic scarring and volume loss of left lower lobe Plan Wean supplemental oxygen  Continue prednisone  40 mg daily. May need slow taper Continue scheduled bronchodilators. Currently on triple nebs while inpatient Anticipate a prolonged recovery typical of RSV infection  PE (2022) Plan Hold DOAC for now w/ hemoptysis   Obstructive sleep apnea CPAP   PCCM will follow.   Care Team: 50 min  Slater Staff, M.D. [facility] Pulmonary/Critical Care Medicine 04/07/2023 8:56 AM   See Amion for personal pager For hours between 7 PM to 7 AM, please call Elink for urgent questions

## 2023-04-07 NOTE — Progress Notes (Addendum)
 Occupational Therapy Treatment Patient Details Name: Mario [name] MRN: [id number] DOB: 03/06/1950 Today's Date: 04/07/2023   History of present illness 73 y.o. male presents to the ED for evaluation of shortness of breath. pt was discharged earlier same day after being admitted initially on 03/17/2023 for acute on chronic hypoxic respiratory failure in the setting of community-acquired pneumonia, RSV infection, and COPD exacerbation.    medical history significant for COPD, chronic hypoxic respiratory failure on 5 L O2 [place], history of PE on Eliquis , history of left lung cancer (s/p radiation), T2DM, HTN, BPH, GAD   OT comments  Pt making progress with functional goals. Pt sitting EOB finishing his lunch upon arrival. O2 SATs at rest 92%. Pt Sup level with functional tasks and mobility using no AD with O2 SATs dropping to 86% on 5L O2 during in room activity. Pt able perform pursed lip, deep breathing along with activity pacing to recover O2 SATs to 88-92%. Pt educated on use of A/E for energy conservation for LB dressing tasks with energy conservation handouts provided. OT will continue to follow acutely to maximize level of function and safety      If plan is discharge home, recommend the following:  A little help with walking and/or transfers;A little help with bathing/dressing/bathroom;Assistance with cooking/housework;Direct supervision/assist for medications management;Direct supervision/assist for financial management;Help with stairs or ramp for entrance;Assist for transportation   Equipment Recommendations  None recommended by OT    Recommendations for Other Services      Precautions / Restrictions Precautions Precautions: Fall Precaution Comments: monitor O2 and HR/5L at baseline Restrictions Weight Bearing Restrictions Per Provider Order: No       Mobility Bed Mobility Overal bed mobility: Independent             General bed mobility comments: patient sitting EOB upon  arrival    Transfers Overall transfer level: Needs assistance Equipment used: None Transfers: Sit to/from Stand Sit to Stand: Supervision                 Balance Overall balance assessment: No apparent balance deficits (not formally assessed)                                         ADL either performed or assessed with clinical judgement   ADL Overall ADL's : Needs assistance/impaired     Grooming: Wash/dry hands;Wash/dry face;Supervision/safety;Standing               Lower Body Dressing: Supervision/safety;Sitting/lateral leans;Sit to/from stand Lower Body Dressing Details (indicate cue type and reason): pt educated on use of A/E for energy conservation for LB dressing tasks Toilet Transfer: Supervision/safety;Ambulation   Toileting- Clothing Manipulation and Hygiene: Supervision/safety;Sit to/from stand       Functional mobility during ADLs: Supervision/safety General ADL Comments: O2 SATs dropping to 86% on 5L O2 during in room activity. Pt able perform pursed lip, deep breathing along with activity pacing to recover O2 SATs to 88-92%    Extremity/Trunk Assessment Upper Extremity Assessment Upper Extremity Assessment: Overall WFL for tasks assessed   Lower Extremity Assessment Lower Extremity Assessment: Defer to PT evaluation   Cervical / Trunk Assessment Cervical / Trunk Assessment: Normal    Vision Ability to See in Adequate Light: 0 Adequate Patient Visual Report: No change from baseline     Perception     Praxis      Cognition Arousal:  Alert Behavior During Therapy: WFL for tasks assessed/performed, Flat affect Overall Cognitive Status: Within Functional Limits for tasks assessed                                          Exercises      Shoulder Instructions       General Comments      Pertinent Vitals/ Pain       Pain Assessment Pain Assessment: No/denies pain  Home Living                                           Prior Functioning/Environment              Frequency  Min 1X/week        Progress Toward Goals  OT Goals(current goals can now be found in the care plan section)  Progress towards OT goals: Progressing toward goals     Plan      Co-evaluation                 AM-PAC OT 6 Clicks Daily Activity     Outcome Measure   Help from another person eating meals?: None Help from another person taking care of personal grooming?: None Help from another person toileting, which includes using toliet, bedpan, or urinal?: A Little Help from another person bathing (including washing, rinsing, drying)?: A Little Help from another person to put on and taking off regular upper body clothing?: A Little Help from another person to put on and taking off regular lower body clothing?: A Little 6 Click Score: 20    End of Session Equipment Utilized During Treatment: Oxygen   OT Visit Diagnosis: Unsteadiness on feet (R26.81)   Activity Tolerance Patient limited by fatigue   Patient Left with call bell/phone within reach;in bed;Other (comment) (sitting EOB)   Nurse Communication          Time: 8793-8769 OT Time Calculation (min): 24 min  Charges: OT General Charges $OT Visit: 1 Visit OT Treatments $Self Care/Home Management : 8-22 mins $Therapeutic Activity: 8-22 mins    Jacques Karna Loose 04/07/2023, 1:10 PM

## 2023-04-07 NOTE — Progress Notes (Signed)
 Physical Therapy Treatment Patient Details Name: Mario [name] MRN: [id number] DOB: December 26, 1950 Today's Date: 04/07/2023   History of Present Illness 73 y.o. male presents to the ED for evaluation of shortness of breath. pt was discharged earlier same day after being admitted initially on 03/17/2023 for acute on chronic hypoxic respiratory failure in the setting of community-acquired pneumonia, RSV infection, and COPD exacerbation.    medical history significant for COPD, chronic hypoxic respiratory failure on 5 L O2 [place], history of PE on Eliquis , history of left lung cancer (s/p radiation), T2DM, HTN, BPH, GAD    PT Comments  Pt currently on 5L O2 [place] at rest and SpO2 97%. Pt ambulated short distances with rest breaks on 6L O2 and SPO2 93%.  Pt reports ambulation limited by dyspnea.  Pt continues to have hemoptysis.     If plan is discharge home, recommend the following: Help with stairs or ramp for entrance   Can travel by private vehicle        Equipment Recommendations  None recommended by PT    Recommendations for Other Services       Precautions / Restrictions Precautions Precautions: Fall Precaution Comments: monitor O2 and HR/5L at baseline Restrictions Weight Bearing Restrictions Per Provider Order: No     Mobility  Bed Mobility Overal bed mobility: Independent             General bed mobility comments: sitting EOB on arrival, SpO2 97% on 5L at rest    Transfers Overall transfer level: Needs assistance Equipment used: None Transfers: Sit to/from Stand Sit to Stand: Supervision                Ambulation/Gait Ambulation/Gait assistance: Supervision Gait Distance (Feet): 30 Feet (x2) Assistive device: None Gait Pattern/deviations: Step-through pattern, Decreased stride length Gait velocity: decreased     General Gait Details: only limited by dyspnea, required 2 standing rest breaks 30 ftx2, ambulated on 6L O2 [place] and SPO2 93% during ambulation, HR  112-120 bpm   Stairs             Wheelchair Mobility     Tilt Bed    Modified Rankin (Stroke Patients Only)       Balance                                            Cognition Arousal: Alert Behavior During Therapy: WFL for tasks assessed/performed, Flat affect Overall Cognitive Status: Within Functional Limits for tasks assessed                                          Exercises      General Comments        Pertinent Vitals/Pain Pain Assessment Pain Assessment: No/denies pain    Home Living                          Prior Function            PT Goals (current goals can now be found in the care plan section) Progress towards PT goals: Progressing toward goals    Frequency    Min 1X/week      PT Plan      Co-evaluation  AM-PAC PT 6 Clicks Mobility   Outcome Measure  Help needed turning from your back to your side while in a flat bed without using bedrails?: None Help needed moving from lying on your back to sitting on the side of a flat bed without using bedrails?: None Help needed moving to and from a bed to a chair (including a wheelchair)?: A Little Help needed standing up from a chair using your arms (e.g., wheelchair or bedside chair)?: A Little Help needed to walk in hospital room?: A Little Help needed climbing 3-5 steps with a railing? : A Little 6 Click Score: 20    End of Session Equipment Utilized During Treatment: Oxygen  Activity Tolerance: Patient tolerated treatment well Patient left: with call bell/phone within reach;Other (comment) (sitting EOB)   PT Visit Diagnosis: Other abnormalities of gait and mobility (R26.89)     Time: 8972-8961 PT Time Calculation (min) (ACUTE ONLY): 11 min  Charges:    $Gait Training: 8-22 mins PT General Charges $$ ACUTE PT VISIT: 1 Visit                     Tari KLEIN, DPT Physical Therapist Acute Rehabilitation  Services Office: [phone]  Kati L Payson 04/07/2023, 1:29 PM

## 2023-04-07 NOTE — Progress Notes (Signed)
   04/07/23 1938  BiPAP/CPAP/SIPAP  BiPAP/CPAP/SIPAP Pt Type Adult  Reason BIPAP/CPAP not in use Non-compliant (Patient refused CPAP qhs.)

## 2023-04-07 NOTE — Plan of Care (Signed)
   Problem: Education: Goal: Ability to describe self-care measures that may prevent or decrease complications (Diabetes Survival Skills Education) will improve Outcome: Progressing   Problem: Nutritional: Goal: Maintenance of adequate nutrition will improve Outcome: Progressing

## 2023-04-07 NOTE — Progress Notes (Signed)
 PROGRESS NOTE Mario [name]  FMW:[id number] DOB: August 26, 1950 DOA: 04/01/2023 PCP: [name]  Brief Narrative/Hospital Course: 89 yom w/ COPD, chronic hypoxic respiratory failure on 5 L O2 [name], history of PE on Eliquis , history of left lung cancer (s/p radiation), T2DM, HTN, BPH, GAD admitted on 1/17-2/1 for hypoxic respiratory failure/pneumonia/RSV infection and COPD exacerbation presented in the same day of discharge with persistent dyspnea in setting of COPD exacerbation, CAP, RSV infection. In the ED chest x-ray revealed changing x-ray finding of small right effusion, chronic pleural and consolidative process in the left base.  Was hemodynamically stable.  Patient complained of ongoing shortness of breath cough and hemoptysis-Eliquis  was discontinued seen by pulmonary. Due to persistent hematemesis 2/6-CT chest with contrast done>shows stable large airspace opacity left lower lobe, lingular consistent with pneumonia or possibly fibrosis from prior radiation treatment, small loculated fluid collection seen laterally lung base, right lower lobe opacity slightly improved. Per pulmonary Limited endobronchial options given his high oxygen  requirement at 5 L [name] no good endobronchial options for diffuse disease either.   Subjective: Seen and examined is still having cough and productive now, hemoptysis noticed this morning ?  Maybe getting lighter Overnight afebrile BP stable on 4 L [name] very stable with mild hyponatremia  Procalcitonin less than 0.1 Complains of constipation.  Assessment and Plan: Principal Problem:   COPD with acute bronchitis (HCC) Active Problems:   Chronic respiratory failure with hypoxia, on home O2 therapy (HCC) - 4 L/min   DM2 (diabetes mellitus, type 2) (HCC)   History of pulmonary embolus (PE)   BPH (benign prostatic hyperplasia)   OSA (obstructive sleep apnea)   Hypertension associated with diabetes (HCC)   COPD exacerbation (HCC)   Hemoptysis    Acute exacerbation of end-stage COPD Acute on chronic hypoxic respiratory failure-on 5 L [place] at baseline CAP/RSV infection Bronchitis Recent admission 1/17- 2/1 for same: Patient with RSV infection and CAP with subsequent COPD exacerbation and hypoxic respiratory failure acute on chronic, readmitted same day of discharge after 2 wks of hospitalization.Initial leukocytosis resolved, BNP and troponin negative x 2. Respiratory status stable on 5 L [name] and slow to recover typical of RSV Will continue current treatment plan with Brovana , Pulmicort  BID, prednisone  40 mg, bronchodilators antitussives, supplemental oxygen  IS Encourage PT OT  Hemoptysis  Hemoptysis likely due to DOAC in the setting of CAP/RSV infection.  Still with ongoing hemoptysis  CT chest with contrast done>shows stable large airspace opacity left lower lobe, lingular consistent with pneumonia or possibly fibrosis from prior radiation treatment, small loculated fluid collection seen laterally lung base, right lower lobe opacity slightly improved. Per pulmonary Limited endobronchial options given his high oxygen  requirement at 5 L [name] no good endobronchial options for diffuse disease either. Continue tranexamic acid  nebs 3 times daily, ? N may need sytemic dosing.   History of PE: Remote history of PE on Eliquis - but given his history of lung cancer-was maintained on Eliquis . Eliquis  discontinued due to hemoptysis for now.CT angio chest on last admission 1/17 no PE  Lung cancer: Not currently on treatment   Type 2 diabetes: With uncontrolled hyperglycemia blood sugar at time > 200 but relatively controlled while on steroids.  Keep on SSI,holding metformin   Recent Labs  Lab 04/06/23 0747 04/06/23 1145 04/06/23 1618 04/06/23 2159 04/07/23 0724  GLUCAP 126* 140* 208* 170* 138*   Hypertension: BP stable on home Diltiazem  180 mg daily.   BPH: Continue Flomax .   GAD/insomnia: Mood stable.  Continue  home bupropion ,  BuSpar , Cymbalta , Trileptal , trazodone .  Resume his Flexeril   Hyponatremia: Resolved    OSA: CPAP nightly.  Constipation: No BM since last Saturday despite getting MiraLAX  Dulcolax suppository.  Will order lactulose  enema  Obesity: Patient's Body mass index is 38.41 kg/m. : Will benefit with PCP follow-up, weight loss  healthy lifestyle and outpatient sleep evaluation.   DVT prophylaxis: Place and maintain sequential compression device Start: 04/05/23 1342 Code Status:   Code Status: Full Code Family Communication: plan of care discussed with patient at bedside. Patient status is: Remains hospitalized because of severity of illness Level of care: Med-Surg   Dispo: The patient is from: home with a roommate            Anticipated disposition: TBD  objective: Vitals last 24 hrs: Vitals:   04/06/23 1404 04/06/23 1442 04/06/23 2202 04/07/23 0607  BP:  138/78 130/77 138/73  Pulse:  98 78 72  Resp:  18 17 18   Temp:  97.7 F (36.5 C) 97.9 F (36.6 C) 97.7 F (36.5 C)  TempSrc:  Oral Oral Oral  SpO2: 94% 96% 91% 97%  Weight:      Height:       Weight change:   Physical Examination: General exam: alert awake, oriented , obese pleasant, coughing intermittently  HEENT:Oral mucosa moist, Ear/Nose WNL grossly Respiratory system: Bilaterally diminished breath sounds, no use of accessory muscle Cardiovascular system: S1 & S2 +, No JVD. Gastrointestinal system: Abdomen soft,NT,ND, BS+ Nervous System: Alert, awake, moving all extremities,and following commands. Extremities: LE edema neg,distal peripheral pulses palpable and warm.  Skin: No rashes,no icterus. MSK: Normal muscle bulk,tone, power   Medications reviewed:  Scheduled Meds:  arformoterol   15 mcg Nebulization BID   ascorbic acid   500 mg Oral Daily   aspirin  EC  81 mg Oral Daily   azelastine   2 spray Each Nare BID   budesonide  (PULMICORT ) nebulizer solution  0.25 mg Nebulization BID   buPROPion   300 mg Oral q  morning   busPIRone   15 mg Oral Daily   cholecalciferol   1,000 Units Oral Daily   diltiazem   180 mg Oral Daily   docusate sodium   200 mg Oral BID   DULoxetine   60 mg Oral BID   fluticasone   2 spray Each Nare Daily   folic acid   1 mg Oral Daily   gabapentin   100 mg Oral TID   guaiFENesin   1,200 mg Oral BID   insulin  aspart  0-5 Units Subcutaneous QHS   insulin  aspart  0-9 Units Subcutaneous TID WC   magnesium  oxide  200 mg Oral Daily   melatonin  3 mg Oral QHS   multivitamin with minerals  1 tablet Oral Daily   Oxcarbazepine   600 mg Oral TID   pantoprazole   40 mg Oral BID   polyethylene glycol  17 g Oral TID   predniSONE   40 mg Oral Q breakfast   revefenacin   175 mcg Nebulization Daily   sodium chloride   2 spray Each Nare BID   tamsulosin   0.8 mg Oral Daily   tranexamic acid   500 mg Nebulization Q8H   traZODone   75 mg Oral QHS   Continuous Infusions:   Diet Order             Diet heart healthy/carb modified Room service appropriate? Yes; Fluid consistency: Thin  Diet effective now                  Intake/Output Summary (Last 24 hours) at  04/07/2023 0754 Last data filed at 04/07/2023 0620 Gross per 24 hour  Intake 1720 ml  Output 2250 ml  Net -530 ml   Net IO Since Admission: -2,560 mL [04/07/23 0754]  Wt Readings from Last 3 Encounters:  04/05/23 135.7 kg  03/17/23 (!) 141.3 kg  01/26/23 127 kg     Unresulted Labs (From admission, onward)     Start     Ordered   04/04/23 0500  Basic metabolic panel  Daily,   R      04/03/23 0956          Data Reviewed: I have personally reviewed following labs and imaging studies CBC: Recent Labs  Lab 04/01/23 1840 04/02/23 0924 04/04/23 0508 04/05/23 0458 04/07/23 0501  WBC 13.7* 10.9* 10.2 12.7* 10.1  NEUTROABS 10.4*  --   --   --   --   HGB 14.0 14.4 12.8* 13.8 12.8*  HCT 42.0 42.7 38.9* 41.8 39.2  MCV 91.5 91.6 92.4 91.3 92.0  PLT 292 314 264 278 263   Basic Metabolic Panel:  Recent Labs  Lab  04/01/23 0604 04/01/23 1840 04/02/23 0924 04/04/23 0508 04/05/23 0458 04/06/23 0510 04/07/23 0501  NA 132*   < > 135 133* 129* 130* 131*  K 4.1   < > 4.8 4.0 3.9 3.9 3.7  CL 98   < > 99 99 94* 94* 96*  CO2 26   < > 25 26 26 26 27   GLUCOSE 133*   < > 184* 163* 138* 126* 157*  BUN 19   < > 15 17 17 18 15   CREATININE 0.90   < > 0.60* 0.75 0.59* 0.80 0.76  CALCIUM  8.3*   < > 8.8* 8.4* 8.7* 8.7* 8.4*  MG 2.2  --   --   --   --   --   --    < > = values in this interval not displayed.   GFR: Estimated Creatinine Clearance: 124.1 mL/min (by C-G formula based on SCr of 0.76 mg/dL). Liver Function Tests:  Recent Labs  Lab 04/01/23 0604 04/01/23 1840  AST 15 18  ALT 17 18  ALKPHOS 58 65  BILITOT 0.6 0.6  PROT 6.1* 6.9  ALBUMIN 3.0* 3.7   No results for input(s): HGBA1C in the last 72 hours. Recent Labs  Lab 04/06/23 0747 04/06/23 1145 04/06/23 1618 04/06/23 2159 04/07/23 0724  GLUCAP 126* 140* 208* 170* 138*   No results for input(s): CHOL, HDL, LDLCALC, TRIG, CHOLHDL, LDLDIRECT in the last 72 hours. No results for input(s): TSH, T4TOTAL, FREET4, T3FREE, THYROIDAB in the last 72 hours. Sepsis Labs: Recent Labs  Lab 04/06/23 1810  PROCALCITON <0.10   No results found for this or any previous visit (from the past 240 hours).   Antimicrobials/Microbiology: Anti-infectives (From admission, onward)    None         Component Value Date/Time   SDES EXPECTORATED SPUTUM 03/24/2023 1656   SDES  03/24/2023 1656    EXPECTORATED SPUTUM Performed at [facility], [address], [place], [place] [address]    SPECREQUEST NONE 03/24/2023 1656   SPECREQUEST  03/24/2023 1656    NONE Reflexed from [id number] Performed at [facility], [address] [place], [place] [address]    CULT  03/24/2023 1656    MODERATE Normal respiratory flora-no Staph aureus or Pseudomonas seen Performed at [facility], [address], [place], [place] [address]    REPTSTATUS 03/24/2023 FINAL  03/24/2023 1656   REPTSTATUS 03/27/2023 FINAL 03/24/2023 1656     Radiology Studies: CT CHEST W CONTRAST Result Date: 04/06/2023 CLINICAL DATA:  Hemoptysis. EXAM: CT CHEST WITH CONTRAST TECHNIQUE: Multidetector CT imaging of the chest was performed during intravenous contrast administration. RADIATION DOSE REDUCTION: This exam was performed according to the departmental dose-optimization program which includes automated exposure control, adjustment of the mA and/or kV according to patient size and/or use of iterative reconstruction technique. CONTRAST:  75mL OMNIPAQUE  IOHEXOL  300 MG/ML  SOLN COMPARISON:  March 17, 2023. FINDINGS: Cardiovascular: Atherosclerosis of thoracic aorta is noted without aneurysm or dissection. Normal cardiac size. No pericardial effusion. Mild coronary artery calcifications are noted. Mediastinum/Nodes: No enlarged mediastinal, hilar, or axillary lymph nodes. Thyroid  gland, trachea, and esophagus demonstrate no significant findings. Lungs/Pleura: No pneumothorax is noted. Emphysematous disease is noted bilaterally. Small loculated pleural effusion is noted laterally in the left lung base. Stable airspace opacity is noted in left lower lobe and lingular segment left upper lobe concerning for pneumonia or possibly fibrosis from prior radiation treatment. Multiple patchy airspace opacities are noted in the right lower lobe which are slightly decreased compared to prior exam, most consistent with multifocal pneumonia, although underlying malignancy cannot be excluded given the history of lung cancer. Upper Abdomen: No acute abnormality. Musculoskeletal: Old left rib fractures are again noted. No definite acute osseous abnormality is noted. IMPRESSION: Stable large airspace opacity is noted in left lower lobe as well as in lingular segment of left upper lobe most consistent with pneumonia, or possibly fibrosis from prior  radiation treatment given the history of lung cancer. Small loculated fluid collection is seen laterally in the left lung base which potentially may represent empyema. Continued airspace opacity is noted posteriorly in right lower lobe which is slightly improved compared to prior exam and most consistent with pneumonia. Mild coronary artery calcifications are noted. Aortic Atherosclerosis (ICD10-I70.0) and Emphysema (ICD10-J43.9). Electronically Signed   By: Lynwood Landy Raddle M.D.   On: 04/06/2023 12:39      LOS: 3 days   Total time spent in review of labs and imaging, patient evaluation, formulation of plan, documentation and communication with family: 50 minutes  Mennie LAMY, MD  [facility]  04/07/2023, 7:54 AM

## 2023-04-07 NOTE — Plan of Care (Signed)
  Problem: Fluid Volume: Goal: Ability to maintain a balanced intake and output will improve Outcome: Progressing   Problem: Nutritional: Goal: Maintenance of adequate nutrition will improve Outcome: Progressing   Problem: Education: Goal: Knowledge of General Education information will improve Description: Including pain rating scale, medication(s)/side effects and non-pharmacologic comfort measures Outcome: Progressing   Problem: Clinical Measurements: Goal: Will remain free from infection Outcome: Progressing Goal: Diagnostic test results will improve Outcome: Progressing

## 2023-04-08 DIAGNOSIS — G4733 Obstructive sleep apnea (adult) (pediatric): Secondary | ICD-10-CM

## 2023-04-08 DIAGNOSIS — J209 Acute bronchitis, unspecified: Secondary | ICD-10-CM | POA: Diagnosis not present

## 2023-04-08 DIAGNOSIS — J440 Chronic obstructive pulmonary disease with acute lower respiratory infection: Secondary | ICD-10-CM | POA: Diagnosis not present

## 2023-04-08 LAB — BASIC METABOLIC PANEL
Anion gap: 7 (ref 5–15)
BUN: 16 mg/dL (ref 8–23)
CO2: 29 mmol/L (ref 22–32)
Calcium: 8.5 mg/dL — ABNORMAL LOW (ref 8.9–10.3)
Chloride: 95 mmol/L — ABNORMAL LOW (ref 98–111)
Creatinine, Ser: 0.76 mg/dL (ref 0.61–1.24)
GFR, Estimated: >60 mL/min (ref >60)
Glucose, Bld: 156 mg/dL — ABNORMAL HIGH (ref 70–99)
Potassium: 3.7 mmol/L (ref 3.5–5.1)
Sodium: 131 mmol/L — ABNORMAL LOW (ref 135–145)

## 2023-04-08 LAB — GLUCOSE, CAPILLARY
Glucose-Capillary: 120 mg/dL — ABNORMAL HIGH (ref 70–99)
Glucose-Capillary: 108 mg/dL — ABNORMAL HIGH (ref 70–99)
Glucose-Capillary: 236 mg/dL — ABNORMAL HIGH (ref 70–99)
Glucose-Capillary: 149 mg/dL — ABNORMAL HIGH (ref 70–99)

## 2023-04-08 MED ORDER — GUAIFENESIN-DM 100-10 MG/5ML PO SYRP
5.0000 mL | ORAL_SOLUTION | ORAL | Status: DC | PRN
  Administered 2023-04-08 – 2023-04-12 (×8): 5 mL via ORAL
  Filled 2023-04-08 (×10): qty 10

## 2023-04-08 MED ORDER — TRANEXAMIC ACID FOR INHALATION
500.0000 mg | Freq: Two times a day (BID) | RESPIRATORY_TRACT | Status: DC
  Filled 2023-04-08: qty 10

## 2023-04-08 MED ORDER — HYDROCOD POLI-CHLORPHE POLI ER 10-8 MG/5ML PO SUER
5.0000 mL | Freq: Three times a day (TID) | ORAL | Status: DC | PRN
  Administered 2023-04-08 – 2023-04-19 (×8): 5 mL via ORAL
  Filled 2023-04-08 (×10): qty 5

## 2023-04-08 MED ORDER — TRANEXAMIC ACID FOR INHALATION
500.0000 mg | Freq: Three times a day (TID) | RESPIRATORY_TRACT | Status: DC
  Administered 2023-04-08 – 2023-04-12 (×14): 500 mg via RESPIRATORY_TRACT
  Filled 2023-04-08 (×16): qty 10

## 2023-04-08 NOTE — Progress Notes (Signed)
 PROGRESS NOTE Mario [name]  FMW:[id number] DOB: [birth year] DOA: 04/01/2023 [facility]: [name]  Brief Narrative/Hospital Course: 22 yom w/ COPD, chronic hypoxic respiratory failure on 5 L O2 [place], history of PE on Eliquis , history of left lung cancer (s/p radiation), T2DM, HTN, BPH, GAD admitted on 1/17-2/1 for hypoxic respiratory failure/pneumonia/RSV infection and COPD exacerbation presented in the same day of discharge with persistent dyspnea in setting of COPD exacerbation, CAP, RSV infection. In the ED chest x-ray revealed changing x-ray finding of small right effusion, chronic pleural and consolidative process in the left base.  Was hemodynamically stable.  Patient complained of ongoing shortness of breath cough and hemoptysis-Eliquis  was discontinued seen by pulmonary. Due to persistent hematemesis 2/6-CT chest with contrast done>shows stable large airspace opacity left lower lobe, lingular consistent with pneumonia or possibly fibrosis from prior radiation treatment, small loculated fluid collection seen laterally lung base, right lower lobe opacity slightly improved. Per pulmonary Limited endobronchial options given his high oxygen  requirement at 5 L [place] no good endobronchial options for diffuse disease either. Patient continued to have cough along with hemoptysis also having constipation last bowel movement 2/6   Subjective: Seen and examined Complains of ongoing cough and hemoptysis (is less red now) Overnight afebrile BP stable remains on 4 L nasal cannula. No shortness of breath chest pain fever chills Had a small BM with enema  Assessment and Plan: Principal Problem:   COPD with acute bronchitis (HCC) Active Problems:   Chronic respiratory failure with hypoxia, on home O2 therapy (HCC) - 4 L/min   DM2 (diabetes mellitus, type 2) (HCC)   History of pulmonary embolus (PE)   BPH (benign prostatic hyperplasia)   OSA (obstructive sleep apnea)   Hypertension associated with  diabetes (HCC)   COPD exacerbation (HCC)   Hemoptysis   Acute exacerbation of end-stage COPD Acute on chronic hypoxic respiratory failure-on 5 L [place] at baseline CAP/RSV infection Bronchitis Recent admission 1/17- 2/1 for same: Patient with RSV infection and CAP with subsequent COPD exacerbation and hypoxic respiratory failure acute on chronic, readmitted same day of discharge after 2 wks of hospitalization.Initial leukocytosis resolved, BNP and troponin negative x 2. Respiratory status stable on 5 L [place] and slow to recover typical of RSV Will continue current treatment plan with Brovana , Pulmicort  BID, prednisone  40 mg, bronchodilators antitussives, supplemental oxygen  IS Encourage PT OT  Hemoptysis  Hemoptysis likely due to DOAC in the setting of CAP/RSV infection.  Still with ongoing hemoptysis  CT chest with contrast done>shows stable large airspace opacity left lower lobe, lingular consistent with pneumonia or possibly fibrosis from prior radiation treatment, small loculated fluid collection seen laterally lung base, right lower lobe opacity slightly improved. Per pulmonary Limited endobronchial options given his high oxygen  requirement at 5 L  no good endobronchial options for diffuse disease either. Continue tranexamic acid  nebs tid and cont POC per PCCM. He states if Sytemic dosing of TRX causes clot he does not want to have it .   History of PE: Remote history of PE on Eliquis - but given his history of lung cancer-was maintained on Eliquis . Eliquis  discontinued due to hemoptysis for now.CT angio chest on last admission 1/17 no PE  Lung cancer: Not currently on treatment   Type 2 diabetes: With uncontrolled hyperglycemia blood sugar at time > 200 but relatively controlled while on steroids.  Keep on SSI,holding metformin   Recent Labs  Lab 04/07/23 0724 04/07/23 1130 04/07/23 1518 04/07/23 2028 04/08/23 0726  GLUCAP 138* 139* 225*  165* 120*   Hypertension: BP stable on  home Diltiazem  180 mg daily.   BPH: Continue Flomax .   GAD/insomnia: Mood stable.  Continue home bupropion , BuSpar , Cymbalta , Trileptal , trazodone .  Resume his Flexeril   Hyponatremia: Resolved    OSA: CPAP nightly.  Constipation: No BM since last Saturday despite getting MiraLAX  Dulcolax suppository.  Will order lactulose  enema  Obesity: Patient's Body mass index is 38.41 kg/m. : Will benefit with PCP follow-up, weight loss  healthy lifestyle and outpatient sleep evaluation.   DVT prophylaxis: Place and maintain sequential compression device Start: 04/05/23 1342 Code Status:   Code Status: Full Code Family Communication: plan of care discussed with patient at bedside. Patient status is: Remains hospitalized because of severity of illness Level of care: Med-Surg   Dispo: The patient is from: home with a roommate            Anticipated disposition: TBD  objective: Vitals last 24 hrs: Vitals:   04/07/23 1353 04/07/23 1359 04/07/23 1933 04/08/23 0631  BP: 137/78   130/68  Pulse: 99   71  Resp: 18   18  Temp: 98.4 F (36.9 C)   97.8 F (36.6 C)  TempSrc: Oral   Oral  SpO2: 91% 95% 94% 99%  Weight:      Height:       Weight change:   Physical Examination: General exam: alert awake, oriented , obese, pleasant with intermittent coughing HEENT:Oral mucosa moist, Ear/Nose WNL grossly Respiratory system: Bilaterally diminished BS,no use of accessory muscle Cardiovascular system: S1 & S2 +, No JVD. Gastrointestinal system: Abdomen soft,NT,ND, BS+ Nervous System: Alert, awake, moving all extremities,and following commands. Extremities: LE edema neg,distal peripheral pulses palpable and warm.  Skin: No rashes,no icterus. MSK: Normal muscle bulk,tone, power   Medications reviewed:  Scheduled Meds:  acetaminophen -codeine   5 mL Oral Once   arformoterol   15 mcg Nebulization BID   ascorbic acid   500 mg Oral Daily   aspirin  EC  81 mg Oral Daily   azelastine   2 spray Each  Nare BID   budesonide  (PULMICORT ) nebulizer solution  0.25 mg Nebulization BID   buPROPion   300 mg Oral q morning   busPIRone   15 mg Oral Daily   cholecalciferol   1,000 Units Oral Daily   diltiazem   180 mg Oral Daily   docusate sodium   200 mg Oral BID   DULoxetine   60 mg Oral BID   fluticasone   2 spray Each Nare Daily   folic acid   1 mg Oral Daily   gabapentin   100 mg Oral TID   guaiFENesin   1,200 mg Oral BID   insulin  aspart  0-5 Units Subcutaneous QHS   insulin  aspart  0-9 Units Subcutaneous TID WC   magnesium  oxide  200 mg Oral Daily   melatonin  3 mg Oral QHS   multivitamin with minerals  1 tablet Oral Daily   Oxcarbazepine   600 mg Oral TID   pantoprazole   40 mg Oral BID   polyethylene glycol  17 g Oral TID   predniSONE   40 mg Oral Q breakfast   revefenacin   175 mcg Nebulization Daily   sodium chloride   2 spray Each Nare BID   tamsulosin   0.8 mg Oral Daily   traZODone   75 mg Oral QHS   Continuous Infusions:   Diet Order             Diet regular Fluid consistency: Thin  Diet effective now  Intake/Output Summary (Last 24 hours) at 04/08/2023 1041 Last data filed at 04/08/2023 1000 Gross per 24 hour  Intake 1320 ml  Output 2000 ml  Net -680 ml   Net IO Since Admission: -3,480 mL [04/08/23 1041]  Wt Readings from Last 3 Encounters:  04/05/23 135.7 kg  03/17/23 (!) 141.3 kg  01/26/23 127 kg     Unresulted Labs (From admission, onward)    None     Data Reviewed: I have personally reviewed following labs and imaging studies CBC: Recent Labs  Lab 04/01/23 1840 04/02/23 0924 04/04/23 0508 04/05/23 0458 04/07/23 0501  WBC 13.7* 10.9* 10.2 12.7* 10.1  NEUTROABS 10.4*  --   --   --   --   HGB 14.0 14.4 12.8* 13.8 12.8*  HCT 42.0 42.7 38.9* 41.8 39.2  MCV 91.5 91.6 92.4 91.3 92.0  PLT 292 314 264 278 263   Basic Metabolic Panel:  Recent Labs  Lab 04/04/23 0508 04/05/23 0458 04/06/23 0510 04/07/23 0501 04/08/23 0537  NA 133* 129*  130* 131* 131*  K 4.0 3.9 3.9 3.7 3.7  CL 99 94* 94* 96* 95*  CO2 26 26 26 27 29   GLUCOSE 163* 138* 126* 157* 156*  BUN 17 17 18 15 16   CREATININE 0.75 0.59* 0.80 0.76 0.76  CALCIUM  8.4* 8.7* 8.7* 8.4* 8.5*   GFR: Estimated Creatinine Clearance: 124.1 mL/min (by C-G formula based on SCr of 0.76 mg/dL). Liver Function Tests:  Recent Labs  Lab 04/01/23 1840  AST 18  ALT 18  ALKPHOS 65  BILITOT 0.6  PROT 6.9  ALBUMIN 3.7   No results for input(s): HGBA1C in the last 72 hours. Recent Labs  Lab 04/07/23 0724 04/07/23 1130 04/07/23 1518 04/07/23 2028 04/08/23 0726  GLUCAP 138* 139* 225* 165* 120*   No results for input(s): CHOL, HDL, LDLCALC, TRIG, CHOLHDL, LDLDIRECT in the last 72 hours. No results for input(s): TSH, T4TOTAL, FREET4, T3FREE, THYROIDAB in the last 72 hours. Sepsis Labs: Recent Labs  Lab 04/06/23 1810  PROCALCITON <0.10   No results found for this or any previous visit (from the past 240 hours).   Antimicrobials/Microbiology: Anti-infectives (From admission, onward)    None         Component Value Date/Time   SDES EXPECTORATED SPUTUM 03/24/2023 1656   SDES  03/24/2023 1656    EXPECTORATED SPUTUM Performed at [facility], [address] [place], [place] [address]    SPECREQUEST NONE 03/24/2023 1656   SPECREQUEST  03/24/2023 1656    NONE Reflexed from [id number] Performed at [facility], [address] [place], [place] [address]    CULT  03/24/2023 1656    MODERATE Normal respiratory flora-no Staph aureus or Pseudomonas seen Performed at [facility], [address] [place], [place] [address]    REPTSTATUS 03/24/2023 FINAL 03/24/2023 1656   REPTSTATUS 03/27/2023 FINAL 03/24/2023 1656     Radiology Studies: No results found.     LOS: 4 days   Total time spent in review of labs and imaging, patient evaluation, formulation of plan, documentation and communication with  family:   Mennie LAMY, MD  [facility]  04/08/2023, 10:41 AM

## 2023-04-08 NOTE — Progress Notes (Signed)
 PT states he is unavailable to scheduled treatments at this time- no respiratory distress noted at this time.

## 2023-04-08 NOTE — Progress Notes (Signed)
 NAME:  Mario [name], MRN:  [id number], DOB:  11/22/1950, LOS: 4 ADMISSION DATE:  04/01/2023, CONSULTATION DATE:  2/3 REFERRING MD:  KC, CHIEF COMPLAINT:  hemoptysis    History of Present Illness:  73 year old male w/ chronic resp failure in setting of COPD (5lpm baseline) remotely seen by [facility] (not seen in office > 2 yrs). Admitted from 1/17 to 2/1 w/ what AECOPD in setting of RSV infection and prob CAP after treated w/ ABX and systemic steroids.. was sent home on 5 liters rest and instructed to increase to 6 lpm w/ activity. Apparently when home rested then got up to move, walked up flight of stairs, sats 80s, marked dyspnea, HR 130s. Reported back to the ER that evening (same day of discharge)  In ER  Wbc 12.4, BNP 29/ CXR w/ right basilar band-like atx, and persistent left sided airspace disease.  Continued on oxygen , scheduled BDs ordered, got IV steroids followed by orals.  On 2/1 started having episodes of hemoptysis.  This has been fairly persistent.  It has been bright red, about a teaspoon in size happening fairly frequently at least once every 2 hours and has persisted over night into 2/3 he also continues to have resting dyspnea and thus PCCM asked to eval  Pertinent  Medical History  Chronic resp failure 5 lpm baseline, remote PE (on DOAC), lung cancer s/p XRT, type II DM, HTN, BPH, GAD.grade I diastolic dysfxn ef 60-65% Significant Hospital Events: Including procedures, antibiotic start and stop dates in addition to other pertinent events   2/1 discharged and readmitted following treatment for RSV infection possible And AECOPD placed back on steroids and nebulizers 2/3 pulm consulted for shortness of breath and hemoptysis  Interim History / Subjective:  Continues to have low volume hemoptysis, review of pictures on his phone seems slightly improving over time, frothy and mixed now where more frank before.   Objective   Blood pressure 130/68, pulse 71, temperature 97.8 F (36.6  C), temperature source Oral, resp. rate 18, height 6' 2 (1.88 m), weight 135.7 kg, SpO2 99%.        Intake/Output Summary (Last 24 hours) at 04/08/2023 1211 Last data filed at 04/08/2023 1000 Gross per 24 hour  Intake 1320 ml  Output 2000 ml  Net -680 ml   Filed Weights   04/03/23 0500 04/04/23 0610 04/05/23 0500  Weight: (!) 140 kg 134.5 kg 135.7 kg   Physical Exam: General: Well-appearing, no acute distress HENT: [place], AT Eyes: EOMI, no scleral icterus Respiratory: Central rhonchi, no wheezing Cardiovascular: RRR, -M/R/G, no JVD Extremities:-Edema,-tenderness Neuro: AAO x4, CNII-XII grossly intact Psych: Normal mood, normal affect    Resolved Hospital Problem list     Assessment & Plan:   Hemoptysis: Daily small volume for several days - Has been off doac since 04/03/23  - Repeat CT chest is stable - Discussed with IR on 2/6  for consideration embolization of bronchial artery - however reviewed CT Chest with them and since 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  -Appears nidus is LLL vs lingula, RRL appears more impacted airways suspect aspiration of blood L to right or mucous impaction  --Started on 2/6 TXA nebs TID, helping some - to continue - Increase frequency of opiate cough syrup - use this first, need cough to slow down as likely causing repeated insult and ongoing hemoptysis - in setting of RSV  very challenging    Chronic hypoxic resp failure 2/2 COPD s/p recent RSV URI and CAP superimposed on underlying remote lung cancer w/ chronic scarring and volume loss of left lower lobe -Wean supplemental oxygen  -Continue prednisone  40 mg daily x 7 days (through 2/9), decrease by 10 mg every 7 days to off -Continue scheduled bronchodilators. Currently on triple nebs while inpatient -Anticipate a prolonged recovery typical of RSV infection -IV lasix  as needed  PE (2022) -Hold DOAC for  now w/ hemoptysis  Obstructive sleep apnea -CPAP being refused by patient per RT notes   Donnice JONELLE Beals, MD [facility] Pulmonary/Critical Care Medicine 04/08/2023 12:11 PM   See Amion for personal pager For hours between 7 PM to 7 AM, please call Elink for urgent questions

## 2023-04-08 NOTE — Progress Notes (Signed)
 Mobility Specialist - Progress Note   04/08/23 1648  Oxygen  Therapy  SpO2 92 %  O2 Device Nasal Cannula  O2 Flow Rate (L/min) 6 L/min  Patient Activity (if Appropriate) Ambulating  Mobility  Activity Ambulated independently in hallway  Level of Assistance Independent  Assistive Device None  Distance Ambulated (ft) 160 ft  Activity Response Tolerated well  Mobility Referral Yes  Mobility visit 1 Mobility  Mobility Specialist Start Time (ACUTE ONLY) 1635  Mobility Specialist Stop Time (ACUTE ONLY) 1644  Mobility Specialist Time Calculation (min) (ACUTE ONLY) 9 min   Pt received in bed and agreeable to mobility. No complaints during session. Pt to EOB after session with all needs met.    Pre-mobility: 110 HR, 91% SpO2 (6L [place]) During mobility: 124 HR, 92% SpO2 (6L [place]) Post-mobility: 122 HR, 90% SPO2 (6L [place])  Chief Technology Officer

## 2023-04-08 NOTE — Progress Notes (Signed)
   04/08/23 2222  BiPAP/CPAP/SIPAP  BiPAP/CPAP/SIPAP Pt Type Adult  Reason BIPAP/CPAP not in use Non-compliant (will not wear)

## 2023-04-08 NOTE — Plan of Care (Signed)
   Problem: Fluid Volume: Goal: Ability to maintain a balanced intake and output will improve Outcome: Progressing   Problem: Nutritional: Goal: Maintenance of adequate nutrition will improve Outcome: Progressing

## 2023-04-09 DIAGNOSIS — J440 Chronic obstructive pulmonary disease with acute lower respiratory infection: Secondary | ICD-10-CM | POA: Diagnosis not present

## 2023-04-09 DIAGNOSIS — J209 Acute bronchitis, unspecified: Secondary | ICD-10-CM | POA: Diagnosis not present

## 2023-04-09 LAB — GLUCOSE, CAPILLARY
Glucose-Capillary: 119 mg/dL — ABNORMAL HIGH (ref 70–99)
Glucose-Capillary: 142 mg/dL — ABNORMAL HIGH (ref 70–99)
Glucose-Capillary: 270 mg/dL — ABNORMAL HIGH (ref 70–99)
Glucose-Capillary: 182 mg/dL — ABNORMAL HIGH (ref 70–99)

## 2023-04-09 NOTE — Progress Notes (Signed)
 Mobility Specialist - Progress Note   04/09/23 0952  Oxygen  Therapy  SpO2 95 %  O2 Device Nasal Cannula  O2 Flow Rate (L/min) 6 L/min  Patient Activity (if Appropriate) Ambulating  Mobility  Activity Ambulated independently in hallway  Level of Assistance Independent  Assistive Device None  Distance Ambulated (ft) 80 ft  Activity Response Tolerated well  Mobility Referral Yes  Mobility visit 1 Mobility  Mobility Specialist Start Time (ACUTE ONLY) [id number]  Mobility Specialist Stop Time (ACUTE ONLY) 0858  Mobility Specialist Time Calculation (min) (ACUTE ONLY) 17 min   Pt received in bathroom requesting assistance with pericare and agreeable to ambulate. Upon returning to EOB, pt desat to 85%. Encouraged seated pursed lip breaths bringing SpO2 to 94%. C/o SOB throughout session despite normal SpO2 readings. No other complaints during session. Pt to EOB after session with all needs met.     Pre-mobility: 94 HR, 85%-94% SpO2 (6L [place]) During mobility: 103 HR, 95% SpO2 (6L [place]) Post-mobility: 101 HR, 92% SPO2 (6L [place])  Chief Technology Officer

## 2023-04-09 NOTE — Progress Notes (Signed)
 PROGRESS NOTE Mario [name]  FMW:[id number] DOB: Apr 08, 1950 DOA: 04/01/2023 PCP: [name]  Brief Narrative/Hospital Course: 13 yom w/ COPD, chronic hypoxic respiratory failure on 5 L O2 [place], history of PE on Eliquis , history of left lung cancer (s/p radiation), T2DM, HTN, BPH, GAD admitted on 1/17-2/1 for hypoxic respiratory failure/pneumonia/RSV infection and COPD exacerbation presented in the same day of discharge with persistent dyspnea in setting of COPD exacerbation, CAP, RSV infection. In the ED chest x-ray revealed changing x-ray finding of small right effusion, chronic pleural and consolidative process in the left base.  Was hemodynamically stable.  Patient complained of ongoing shortness of breath cough and hemoptysis-Eliquis  was discontinued seen by pulmonary. Due to persistent hematemesis 2/6-CT chest with contrast done>shows stable large airspace opacity left lower lobe, lingular consistent with pneumonia or possibly fibrosis from prior radiation treatment, small loculated fluid collection seen laterally lung base, right lower lobe opacity slightly improved. Per pulmonary Limited endobronchial options given his high oxygen  requirement at 5 L [place] no good endobronchial options for diffuse disease either. Patient continued to have cough along with hemoptysis also having constipation last bowel movement 2/6   Subjective: Patient seen this morning he showed me picture of sputum with blood Reports bleeding may be slowing down Breathing remains stable on 5 L of cannula noted shortness of breath  Assessment and Plan: Principal Problem:   COPD with acute bronchitis (HCC) Active Problems:   Chronic respiratory failure with hypoxia, on home O2 therapy (HCC) - 4 L/min   DM2 (diabetes mellitus, type 2) (HCC)   History of pulmonary embolus (PE)   BPH (benign prostatic hyperplasia)   OSA (obstructive sleep apnea)   Hypertension associated with diabetes (HCC)   COPD exacerbation  (HCC)   Hemoptysis   Acute exacerbation of end-stage COPD Acute on chronic hypoxic respiratory failure-on 5 L [place] at baseline CAP/RSV infection Bronchitis Recent admission 1/17- 2/1 for same: Patient with RSV infection and CAP with subsequent COPD exacerbation and hypoxic respiratory failure acute on chronic, readmitted same day of discharge after 2 wks of hospitalization.Initial leukocytosis resolved, BNP and troponin negative x 2. Remains on home oxygen  of 5 L, overall very slow to recover. Cont nebs [place] , Pulmicort  BID, prednisone  40 mg> change to 30mg  2/10 with plan to decrease dose by 10mg  weekly and taper off Continue antitussives supplemental oxygen   Hemoptysis  Hemoptysis likely due to DOAC in the setting of CAP/RSV infection.  Still with ongoing hemoptysis  CT chest with contrast done>shows stable large airspace opacity left lower lobe, lingular consistent with pneumonia or possibly fibrosis from prior radiation treatment, small loculated fluid collection seen laterally lung base, right lower lobe opacity slightly improved. Per pulmonary Limited endobronchial options given his high oxygen  requirement at 5 L [place] no good endobronchial options for diffuse disease either. Continue tranexamic acid  nebs tid and cont POC per PCCM.  History of PE: Remote history of PE on Eliquis - but given his history of lung cancer-was maintained on Eliquis . Eliquis  discontinued due to hemoptysis for now.CT angio chest on last admission 1/17 no PE  Lung cancer: Not currently on treatment   Type 2 diabetes: With uncontrolled hyperglycemia blood sugar at time > 200 but relatively controlled while on steroids.  Keep on SSI,holding metformin   Recent Labs  Lab 04/08/23 0726 04/08/23 1135 04/08/23 1705 04/08/23 2217 04/09/23 0733  GLUCAP 120* 108* 236* 149* 119*   Hypertension: BP stable on home Diltiazem  180 mg daily.   BPH: Continue Flomax .  GAD/insomnia: Mood stable.  Continue home  bupropion , BuSpar , Cymbalta , Trileptal , trazodone .  Resume his Flexeril   Hyponatremia: Resolved    OSA: CPAP nightly.  Constipation: No BM since last Saturday despite getting MiraLAX  Dulcolax suppository.  Will order lactulose  enema  Obesity: Patient's Body mass index is 38.41 kg/m. : Will benefit with PCP follow-up, weight loss  healthy lifestyle and outpatient sleep evaluation.   DVT prophylaxis: Place and maintain sequential compression device Start: 04/05/23 1342 Code Status:   Code Status: Full Code Family Communication: plan of care discussed with patient at bedside. Patient status is: Remains hospitalized because of severity of illness Level of care: Med-Surg   Dispo: The patient is from: home with a roommate            Anticipated disposition: TBD  objective: Vitals last 24 hrs: Vitals:   04/08/23 2219 04/08/23 2222 04/09/23 0548 04/09/23 0620  BP:  129/71 119/67   Pulse:  89 69   Resp:  20 20   Temp:  98.1 F (36.7 C) 97.9 F (36.6 C)   TempSrc:  Oral Oral   SpO2: 94% 94% 97% 97%  Weight:      Height:       Weight change:   Physical Examination: General exam: alert awake, oriented at baseline, older than stated age HEENT:Oral mucosa moist, Ear/Nose WNL grossly Respiratory system: Bilaterally diminished BS,no use of accessory muscle Cardiovascular system: S1 & S2 +, No JVD. Gastrointestinal system: Abdomen soft,NT,ND, BS+ Nervous System: Alert, awake, moving all extremities,and following commands. Extremities: LE edema neg,distal peripheral pulses palpable and warm.  Skin: No rashes,no icterus. MSK: Normal muscle bulk,tone, power   Medications reviewed:  Scheduled Meds:  arformoterol   15 mcg Nebulization BID   ascorbic acid   500 mg Oral Daily   aspirin  EC  81 mg Oral Daily   azelastine   2 spray Each Nare BID   budesonide  (PULMICORT ) nebulizer solution  0.25 mg Nebulization BID   buPROPion   300 mg Oral q morning   busPIRone   15 mg Oral Daily    cholecalciferol   1,000 Units Oral Daily   diltiazem   180 mg Oral Daily   docusate sodium   200 mg Oral BID   DULoxetine   60 mg Oral BID   fluticasone   2 spray Each Nare Daily   folic acid   1 mg Oral Daily   gabapentin   100 mg Oral TID   guaiFENesin   1,200 mg Oral BID   insulin  aspart  0-5 Units Subcutaneous QHS   insulin  aspart  0-9 Units Subcutaneous TID WC   magnesium  oxide  200 mg Oral Daily   melatonin  3 mg Oral QHS   multivitamin with minerals  1 tablet Oral Daily   Oxcarbazepine   600 mg Oral TID   pantoprazole   40 mg Oral BID   polyethylene glycol  17 g Oral TID   predniSONE   40 mg Oral Q breakfast   revefenacin   175 mcg Nebulization Daily   sodium chloride   2 spray Each Nare BID   tamsulosin   0.8 mg Oral Daily   tranexamic acid   500 mg Nebulization Q8H   traZODone   75 mg Oral QHS   Continuous Infusions:   Diet Order             Diet regular Fluid consistency: Thin  Diet effective now                  Intake/Output Summary (Last 24 hours) at 04/09/2023 9177 Last data filed at  04/09/2023 0747 Gross per 24 hour  Intake 1560 ml  Output 3250 ml  Net -1690 ml   Net IO Since Admission: -5,300 mL [04/09/23 0822]  Wt Readings from Last 3 Encounters:  04/05/23 135.7 kg  03/17/23 (!) 141.3 kg  01/26/23 127 kg     Unresulted Labs (From admission, onward)    None     Data Reviewed: I have personally reviewed following labs and imaging studies CBC: Recent Labs  Lab 04/02/23 0924 04/04/23 0508 04/05/23 0458 04/07/23 0501  WBC 10.9* 10.2 12.7* 10.1  HGB 14.4 12.8* 13.8 12.8*  HCT 42.7 38.9* 41.8 39.2  MCV 91.6 92.4 91.3 92.0  PLT 314 264 278 263   Basic Metabolic Panel:  Recent Labs  Lab 04/04/23 0508 04/05/23 0458 04/06/23 0510 04/07/23 0501 04/08/23 0537  NA 133* 129* 130* 131* 131*  K 4.0 3.9 3.9 3.7 3.7  CL 99 94* 94* 96* 95*  CO2 26 26 26 27 29   GLUCOSE 163* 138* 126* 157* 156*  BUN 17 17 18 15 16   CREATININE 0.75 0.59* 0.80 0.76 0.76   CALCIUM  8.4* 8.7* 8.7* 8.4* 8.5*   GFR: Estimated Creatinine Clearance: 124.1 mL/min (by C-G formula based on SCr of 0.76 mg/dL). Liver Function Tests:  No results for input(s): AST, ALT, ALKPHOS, BILITOT, PROT, ALBUMIN in the last 168 hours.  No results for input(s): HGBA1C in the last 72 hours. Recent Labs  Lab 04/08/23 0726 04/08/23 1135 04/08/23 1705 04/08/23 2217 04/09/23 0733  GLUCAP 120* 108* 236* 149* 119*   No results for input(s): CHOL, HDL, LDLCALC, TRIG, CHOLHDL, LDLDIRECT in the last 72 hours. No results for input(s): TSH, T4TOTAL, FREET4, T3FREE, THYROIDAB in the last 72 hours. Sepsis Labs: Recent Labs  Lab 04/06/23 1810  PROCALCITON <0.10   No results found for this or any previous visit (from the past 240 hours).   Antimicrobials/Microbiology: Anti-infectives (From admission, onward)    None         Component Value Date/Time   SDES EXPECTORATED SPUTUM 03/24/2023 1656   SDES  03/24/2023 1656    EXPECTORATED SPUTUM Performed at [facility], [address], [place], [place] [address]    SPECREQUEST NONE 03/24/2023 1656   SPECREQUEST  03/24/2023 1656    NONE Reflexed from [id number] Performed at [facility], [address], [place], [place] [address]    CULT  03/24/2023 1656    MODERATE Normal respiratory flora-no Staph aureus or Pseudomonas seen Performed at [facility], [address] [place], [place] [address]    REPTSTATUS 03/24/2023 FINAL 03/24/2023 1656   REPTSTATUS 03/27/2023 FINAL 03/24/2023 1656     Radiology Studies: No results found.     LOS: 5 days   Total time spent in review of labs and imaging, patient evaluation, formulation of plan, documentation and communication with family:   Mennie LAMY, MD  [facility]  04/09/2023, 8:22 AM

## 2023-04-09 NOTE — Plan of Care (Signed)
   Problem: Fluid Volume: Goal: Ability to maintain a balanced intake and output will improve Outcome: Progressing

## 2023-04-09 NOTE — Progress Notes (Signed)
   04/09/23 2214  BiPAP/CPAP/SIPAP  Reason BIPAP/CPAP not in use Non-compliant (patient does not want to wear)

## 2023-04-10 DIAGNOSIS — J209 Acute bronchitis, unspecified: Secondary | ICD-10-CM | POA: Diagnosis not present

## 2023-04-10 DIAGNOSIS — J440 Chronic obstructive pulmonary disease with acute lower respiratory infection: Secondary | ICD-10-CM | POA: Diagnosis not present

## 2023-04-10 LAB — GLUCOSE, CAPILLARY
Glucose-Capillary: 128 mg/dL — ABNORMAL HIGH (ref 70–99)
Glucose-Capillary: 140 mg/dL — ABNORMAL HIGH (ref 70–99)
Glucose-Capillary: 265 mg/dL — ABNORMAL HIGH (ref 70–99)
Glucose-Capillary: 207 mg/dL — ABNORMAL HIGH (ref 70–99)

## 2023-04-10 MED ORDER — DOXYCYCLINE HYCLATE 100 MG PO TABS
100.0000 mg | ORAL_TABLET | Freq: Two times a day (BID) | ORAL | Status: AC
  Administered 2023-04-10 – 2023-04-16 (×14): 100 mg via ORAL
  Filled 2023-04-10 (×14): qty 1

## 2023-04-10 MED ORDER — PREDNISONE 5 MG PO TABS
30.0000 mg | ORAL_TABLET | Freq: Every day | ORAL | Status: DC
  Administered 2023-04-11 – 2023-04-14 (×4): 30 mg via ORAL
  Filled 2023-04-10 (×4): qty 2

## 2023-04-10 MED ORDER — OXYCODONE HCL 5 MG PO TABS
10.0000 mg | ORAL_TABLET | Freq: Two times a day (BID) | ORAL | Status: DC | PRN
  Administered 2023-04-10 – 2023-04-23 (×11): 10 mg via ORAL
  Filled 2023-04-10 (×12): qty 2

## 2023-04-10 MED ORDER — SODIUM CHLORIDE 3 % IN NEBU
4.0000 mL | INHALATION_SOLUTION | Freq: Two times a day (BID) | RESPIRATORY_TRACT | Status: AC
  Administered 2023-04-10 – 2023-04-12 (×6): 4 mL via RESPIRATORY_TRACT
  Filled 2023-04-10 (×6): qty 4

## 2023-04-10 NOTE — Progress Notes (Signed)
 Physical Therapy Treatment Patient Details Name: Mario [name] MRN: [id number] DOB: 1950-08-09 Today's Date: 04/10/2023   History of Present Illness 73 y.o. male presents to the ED for evaluation of shortness of breath. pt was discharged earlier same day after being admitted initially on 03/17/2023 for acute on chronic hypoxic respiratory failure in the setting of community-acquired pneumonia, RSV infection, and COPD exacerbation.    medical history significant for COPD, chronic hypoxic respiratory failure on 5 L O2 [place], history of PE on Eliquis , history of left lung cancer (s/p radiation), T2DM, HTN, BPH, GAD    PT Comments   Pt admitted with above diagnosis.  Pt currently with functional limitations due to the deficits listed below (see PT Problem List). Pt seated EOB when PT arrived. Pt agreeable to therapy intervention. Pt requires 5 L/min supplemental O2 at baseline and was on 6 L/min at beginning of therapy session and during gait tasks. Pt requested PT place pt on 5 L/min and pt desaturated with standing ADL task to 87%. Pt requires frequent therapeutic rest breaks with functional mobility and self care tasks, pt is able to demonstrate breathing strategies. Pt is mod I for bed mobility, S for transfers, S for gait tasks 45 feet x 2 no AD. Pt left seated EOB and all needs in place. Pt will benefit from acute skilled PT to increase their independence and safety with mobility to allow discharge.      If plan is discharge home, recommend the following: Help with stairs or ramp for entrance   Can travel by private vehicle        Equipment Recommendations  None recommended by PT    Recommendations for Other Services       Precautions / Restrictions Precautions Precautions: Fall Precaution Comments: monitor O2 and HR/5L at baseline Restrictions Weight Bearing Restrictions Per Provider Order: No     Mobility  Bed Mobility Overal bed mobility: Independent             General bed  mobility comments: sitting EOB on arrival, SpO2 97% on 6 L at rest    Transfers Overall transfer level: Needs assistance Equipment used: None Transfers: Sit to/from Stand Sit to Stand: Supervision           General transfer comment: min cues for [place] tube management    Ambulation/Gait Ambulation/Gait assistance: Supervision Gait Distance (Feet): 45 Feet Assistive device: None Gait Pattern/deviations: Decreased stride length Gait velocity: decreased     General Gait Details: pt able to amb 45 feet x 2 with one standing theraputic rest break, cues for pursed lip breathing and pt on 6 L/min with O2 saturation 90-91%   Stairs             Wheelchair Mobility     Tilt Bed    Modified Rankin (Stroke Patients Only)       Balance Overall balance assessment: No apparent balance deficits (not formally assessed)                                          Cognition Arousal: Alert Behavior During Therapy: WFL for tasks assessed/performed, Flat affect Overall Cognitive Status: Within Functional Limits for tasks assessed  Exercises      General Comments General comments (skin integrity, edema, etc.): pt on 6 L/min when PT entered room, pt reported he should be on 5 L/min, PT placed pt on 5 L/min and O2 saturation at rest remained the same, 97%. pt reported SOB with standing ADL task at EOB and O2 saturation decreased to 87% on 5 L/min and required > 30s to recover to 90% once seated and with cues for pursed lip breathing.      Pertinent Vitals/Pain Pain Assessment Pain Assessment: No/denies pain    Home Living                          Prior Function            PT Goals (current goals can now be found in the care plan section) Acute Rehab PT Goals PT Goal Formulation: With patient Time For Goal Achievement: 04/16/23 Potential to Achieve Goals: Good Progress towards PT goals:  Progressing toward goals    Frequency    Min 1X/week      PT Plan      Co-evaluation              AM-PAC PT "6 Clicks" Mobility   Outcome Measure  Help needed turning from your back to your side while in a flat bed without using bedrails?: None Help needed moving from lying on your back to sitting on the side of a flat bed without using bedrails?: None Help needed moving to and from a bed to a chair (including a wheelchair)?: A Little Help needed standing up from a chair using your arms (e.g., wheelchair or bedside chair)?: A Little Help needed to walk in hospital room?: A Little Help needed climbing 3-5 steps with a railing? : A Little 6 Click Score: 20    End of Session Equipment Utilized During Treatment: Oxygen  Activity Tolerance: Patient tolerated treatment well Patient left: with call bell/phone within reach;Other (comment) (sitting EOB) Nurse Communication: Mobility status (pt requesting nurse) PT Visit Diagnosis: Other abnormalities of gait and mobility (R26.89)     Time: 0865-7846 PT Time Calculation (min) (ACUTE ONLY): 19 min  Charges:    $Gait Training: 8-22 mins PT General Charges $$ ACUTE PT VISIT: 1 Visit                     Cary Clarks, PT Acute Rehab    Annalee Kiang 04/10/2023, 12:47 PM

## 2023-04-10 NOTE — Plan of Care (Signed)
   Problem: Nutritional: Goal: Maintenance of adequate nutrition will improve Outcome: Progressing   Problem: Education: Goal: Knowledge of General Education information will improve Description: Including pain rating scale, medication(s)/side effects and non-pharmacologic comfort measures Outcome: Progressing

## 2023-04-10 NOTE — Progress Notes (Signed)
 PROGRESS NOTE Mario [name]  OAC:[id number] DOB: 12-06-1950 DOA: 04/01/2023 PCP: [facility]  Brief Narrative/Hospital Course: 67 yom w/ COPD, chronic hypoxic respiratory failure on 5 L O2 [place], history of PE on Eliquis , history of left lung cancer (s/p radiation), T2DM, HTN, BPH, GAD admitted on 1/17-2/1 for hypoxic respiratory failure/pneumonia/RSV infection and COPD exacerbation presented in the same day of discharge with persistent dyspnea in setting of COPD exacerbation, CAP, RSV infection. In the ED chest x-ray revealed changing x-ray finding of small right effusion, chronic pleural and consolidative process in the left base.  Was hemodynamically stable.  Patient complained of ongoing shortness of breath cough and hemoptysis-Eliquis  was discontinued seen by pulmonary. Due to persistent hematemesis 2/6-CT chest with contrast done>shows stable large airspace opacity left lower lobe, lingular consistent with pneumonia or possibly fibrosis from prior radiation treatment, small loculated fluid collection seen laterally lung base, right lower lobe opacity slightly improved. Per pulmonary Limited endobronchial options given his high oxygen  requirement at 5 L [place] no good endobronchial options for diffuse disease either. Patient continued to have cough along with hemoptysis also having constipation last bowel movement 2/6   Subjective: Patient reports she is unable to cough out, still having cough  Remains on 5 L nasal cannula which is baseline  This morning only faint blood in his sputum noticed  He is about to get his first hypertonic saline nebulization  Assessment and Plan: Principal Problem:   COPD with acute bronchitis (HCC) Active Problems:   Chronic respiratory failure with hypoxia, on home O2 therapy (HCC) - 4 L/min   DM2 (diabetes mellitus, type 2) (HCC)   History of pulmonary embolus (PE)   BPH (benign prostatic hyperplasia)   OSA (obstructive sleep apnea)   Hypertension  associated with diabetes (HCC)   COPD exacerbation (HCC)   Hemoptysis   Acute exacerbation of end-stage COPD Acute on chronic hypoxic respiratory failure-on 5 L [place] at baseline CAP/RSV infection Bronchitis Recent admission 1/17- 2/1 for same: Patient with RSV infection and CAP with subsequent COPD exacerbation and hypoxic respiratory failure acute on chronic, readmitted same day of discharge after 2 wks of hospitalization.Initial leukocytosis resolved, BNP and troponin negative x 2. Respiratory status stable on 5 L [place] and slow to recover typical of RSV Will continue current treatment plan with Brovana , Pulmicort  BID, and steroid taper cut to 30 mg from 2/10 and decrease dose by 10 mg every week. Cont bronchodilators antitussives, supplemental oxygen  IS Encourage PT OT  Hemoptysis  Hemoptysis likely due to DOAC in the setting of CAP/RSV infection.  Still with ongoing hemoptysis  CT chest with contrast done>shows stable large airspace opacity left lower lobe, lingular consistent with pneumonia or possibly fibrosis from prior radiation treatment, small loculated fluid collection seen laterally lung base, right lower lobe opacity slightly improved. Per pulmonary Limited endobronchial options given his high oxygen  requirement at 5 L [place] no good endobronchial options for diffuse disease either.  Seems to be clearing up,Continue tranexamic acid  nebs tid and cont POC per PCCM and HS neb added 2/10  History of PE: Remote history of PE on Eliquis - but given his history of lung cancer-was maintained on Eliquis . Eliquis  discontinued due to hemoptysis for now-Per pulmonary discontinue indefinitely.CT angio chest on last admission 1/17 no PE  Lung cancer: Not currently on treatment   Type 2 diabetes: With uncontrolled hyperglycemia blood sugar at time > 200 but relatively controlled while on steroids.  Keep on SSI,holding metformin   Recent Labs  Lab  04/09/23 0733 04/09/23 1136 04/09/23 1627  04/09/23 2117 04/10/23 0739  GLUCAP 119* 142* 270* 182* 128*   Hypertension: BP stable on home Diltiazem  180 mg daily.   BPH: Continue Flomax .   GAD/insomnia: Mood stable.  Continue home bupropion , BuSpar , Cymbalta , Trileptal , trazodone .  Resume his Flexeril   Hyponatremia: Resolved    OSA: CPAP nightly.  Constipation: No BM since last Saturday despite getting MiraLAX  Dulcolax suppository.  Will order lactulose  enema  Obesity: Patient's Body mass index is 38.41 kg/m. : Will benefit with PCP follow-up, weight loss  healthy lifestyle and outpatient sleep evaluation.   DVT prophylaxis: Place and maintain sequential compression device Start: 04/05/23 1342 Code Status:   Code Status: Full Code Family Communication: plan of care discussed with patient at bedside. Patient status is: Remains hospitalized because of severity of illness Level of care: Med-Surg   Dispo: The patient is from: home with a roommate            Anticipated disposition: Home once hemoptysis is better  objective: Vitals last 24 hrs: Vitals:   04/10/23 0542 04/10/23 0627 04/10/23 0819 04/10/23 0820  BP:  132/68    Pulse:  72    Resp:  18    Temp:  97.9 F (36.6 C)    TempSrc:  Oral    SpO2: 94% 98% 98% 98%  Weight:      Height:       Weight change:   Physical Examination: General exam: alert awake, oriented.  Obese.  HEENT:Oral mucosa moist, Ear/Nose WNL grossly Respiratory system: Bilaterally diminished breath sounds no use of accessory respiratory muscles.   Cardiovascular system: S1 & S2 +, No JVD. Gastrointestinal system: Abdomen soft,NT,ND, BS+ Nervous System: Alert, awake, moving all extremities,and following commands. Extremities: LE edema neg,distal peripheral pulses palpable and warm.  Skin: No rashes,no icterus. MSK: Normal muscle bulk,tone, power   Medications reviewed:  Scheduled Meds:  arformoterol   15 mcg Nebulization BID   ascorbic acid   500 mg Oral Daily   aspirin  EC  81  mg Oral Daily   azelastine   2 spray Each Nare BID   budesonide  (PULMICORT ) nebulizer solution  0.25 mg Nebulization BID   buPROPion   300 mg Oral q morning   busPIRone   15 mg Oral Daily   cholecalciferol   1,000 Units Oral Daily   diltiazem   180 mg Oral Daily   docusate sodium   200 mg Oral BID   doxycycline   100 mg Oral Q12H   DULoxetine   60 mg Oral BID   fluticasone   2 spray Each Nare Daily   folic acid   1 mg Oral Daily   gabapentin   100 mg Oral TID   guaiFENesin   1,200 mg Oral BID   insulin  aspart  0-5 Units Subcutaneous QHS   insulin  aspart  0-9 Units Subcutaneous TID WC   magnesium  oxide  200 mg Oral Daily   melatonin  3 mg Oral QHS   multivitamin with minerals  1 tablet Oral Daily   Oxcarbazepine   600 mg Oral TID   pantoprazole   40 mg Oral BID   polyethylene glycol  17 g Oral TID   predniSONE   40 mg Oral Q breakfast   revefenacin   175 mcg Nebulization Daily   sodium chloride   2 spray Each Nare BID   sodium chloride  HYPERTONIC  4 mL Nebulization BID   tamsulosin   0.8 mg Oral Daily   tranexamic acid   500 mg Nebulization Q8H   traZODone   75 mg Oral QHS  Continuous Infusions:   Diet Order             Diet regular Fluid consistency: Thin  Diet effective now                  Intake/Output Summary (Last 24 hours) at 04/10/2023 0911 Last data filed at 04/10/2023 0600 Gross per 24 hour  Intake 440 ml  Output 2150 ml  Net -1710 ml   Net IO Since Admission: -6,770 mL [04/10/23 0911]  Wt Readings from Last 3 Encounters:  04/05/23 135.7 kg  03/17/23 (!) 141.3 kg  01/26/23 127 kg     Unresulted Labs (From admission, onward)    None     Data Reviewed: I have personally reviewed following labs and imaging studies CBC: Recent Labs  Lab 04/04/23 0508 04/05/23 0458 04/07/23 0501  WBC 10.2 12.7* 10.1  HGB 12.8* 13.8 12.8*  HCT 38.9* 41.8 39.2  MCV 92.4 91.3 92.0  PLT 264 278 263   Basic Metabolic Panel:  Recent Labs  Lab 04/04/23 0508 04/05/23 0458  04/06/23 0510 04/07/23 0501 04/08/23 0537  NA 133* 129* 130* 131* 131*  K 4.0 3.9 3.9 3.7 3.7  CL 99 94* 94* 96* 95*  CO2 26 26 26 27 29   GLUCOSE 163* 138* 126* 157* 156*  BUN 17 17 18 15 16   CREATININE 0.75 0.59* 0.80 0.76 0.76  CALCIUM  8.4* 8.7* 8.7* 8.4* 8.5*   GFR: Estimated Creatinine Clearance: 124.1 mL/min (by C-G formula based on SCr of 0.76 mg/dL). Liver Function Tests:  No results for input(s): "AST", "ALT", "ALKPHOS", "BILITOT", "PROT", "ALBUMIN" in the last 168 hours.  No results for input(s): "HGBA1C" in the last 72 hours. Recent Labs  Lab 04/09/23 0733 04/09/23 1136 04/09/23 1627 04/09/23 2117 04/10/23 0739  GLUCAP 119* 142* 270* 182* 128*   No results for input(s): "CHOL", "HDL", "LDLCALC", "TRIG", "CHOLHDL", "LDLDIRECT" in the last 72 hours. No results for input(s): "TSH", "T4TOTAL", "FREET4", "T3FREE", "THYROIDAB" in the last 72 hours. Sepsis Labs: Recent Labs  Lab 04/06/23 1810  PROCALCITON <0.10   No results found for this or any previous visit (from the past 240 hours).   Antimicrobials/Microbiology: Anti-infectives (From admission, onward)    Start     Dose/Rate Route Frequency Ordered Stop   04/10/23 1000  doxycycline  (VIBRA -TABS) tablet 100 mg        100 mg Oral Every 12 hours 04/10/23 0728           Component Value Date/Time   SDES EXPECTORATED SPUTUM 03/24/2023 1656   SDES  03/24/2023 1656    EXPECTORATED SPUTUM Performed at [facility], [address], [place], [place] [address]    SPECREQUEST NONE 03/24/2023 1656   SPECREQUEST  03/24/2023 1656    NONE Reflexed from [id number] Performed at [facility], [address], [place], [place] [address]    CULT  03/24/2023 1656    MODERATE Normal respiratory flora-no Staph aureus or Pseudomonas seen Performed at [facility], [address], [place], [place] [address]    REPTSTATUS 03/24/2023 FINAL 03/24/2023 1656   REPTSTATUS 03/27/2023 FINAL  03/24/2023 1656     Radiology Studies: No results found.     LOS: 6 days   Total time spent in review of labs and imaging, patient evaluation, formulation of plan, documentation and communication with family:   Lesa Rape, MD  [facility]  04/10/2023, 9:11 AM

## 2023-04-10 NOTE — Progress Notes (Signed)
   NAME:  Mario [name], MRN:  [id number], DOB:  [birth year], LOS: 6 ADMISSION DATE:  04/01/2023, CONSULTATION DATE:  2/3 REFERRING MD:  KC, CHIEF COMPLAINT:  hemoptysis    History of Present Illness:  73 year old male w/ chronic resp failure in setting of COPD (5lpm baseline) remotely seen by [facility] (not seen in office > 2 yrs). Admitted from 1/17 to 2/1 w/ what AECOPD in setting of RSV infection and prob CAP after treated w/ ABX and systemic steroids.. was sent home on 5 liters rest and instructed to increase to 6 lpm w/ activity. Apparently when home rested then got up to move, walked up flight of stairs, sats 80s, marked dyspnea, HR 130s. Reported back to the ER that evening (same day of discharge)  In ER  Wbc 12.4, BNP 29/ CXR w/ right basilar band-like atx, and persistent left sided airspace disease.  Continued on oxygen , scheduled BDs ordered, got IV steroids followed by orals.  On 2/1 started having episodes of hemoptysis.  This has been fairly persistent.  It has been bright red, about a teaspoon in size happening fairly frequently at least once every 2 hours and has persisted over night into 2/3 he also continues to have resting dyspnea and thus PCCM asked to eval  Pertinent  Medical History  Chronic resp failure 5 lpm baseline, remote PE (on DOAC), lung cancer s/p XRT, type II DM, HTN, BPH, GAD.grade I diastolic dysfxn ef 60-65% Significant Hospital Events: Including procedures, antibiotic start and stop dates in addition to other pertinent events   2/1 discharged and readmitted following treatment for RSV infection possible And AECOPD placed back on steroids and nebulizers 2/3 pulm consulted for shortness of breath and hemoptysis  Interim History / Subjective:  Having tough time expectorating.  Objective   Blood pressure 132/68, pulse 72, temperature 97.9 F (36.6 C), temperature source Oral, resp. rate 18, height 6\' 2"  (1.88 m), weight 135.7 kg, SpO2 98%.        Intake/Output  Summary (Last 24 hours) at 04/10/2023 0723 Last data filed at 04/10/2023 0600 Gross per 24 hour  Intake 680 ml  Output 2550 ml  Net -1870 ml   Filed Weights   04/03/23 0500 04/04/23 0610 04/05/23 0500  Weight: (!) 140 kg 134.5 kg 135.7 kg   Physical Exam: No distress Rattling upper airway sounds Moves to command RASS 0 Abd soft, +BS  CBGs okay No labs   Resolved Hospital Problem list     Assessment & Plan:   Hemoptysis: Daily small volume for several days; now cannot expectorate and worsening respiratory status Chronic hypoxic resp failure 2/2 COPD s/p recent RSV URI and CAP superimposed on underlying remote lung cancer w/ chronic scarring and volume loss of left lower lobe PE (2022) Obstructive sleep apnea-CPAP being refused by patient per RT notes  - Hypertonic, CPT - Add doxy x 7 days - Hold AC indefinitely for now - Nebs as ordered including TXA - Prednisone  taper as ordered - Progressive mobility: this is hurting him as much as anything  Josiah Nigh, MD [facility] Pulmonary/Critical Care Medicine 04/10/2023 7:23 AM   See Amion for personal pager For hours between 7 PM to 7 AM, please call Elink for urgent questions

## 2023-04-10 NOTE — Progress Notes (Signed)
   04/10/23 2100  BiPAP/CPAP/SIPAP  BiPAP/CPAP/SIPAP Pt Type Adult  Reason BIPAP/CPAP not in use Non-compliant

## 2023-04-11 DIAGNOSIS — J209 Acute bronchitis, unspecified: Secondary | ICD-10-CM | POA: Diagnosis not present

## 2023-04-11 DIAGNOSIS — J440 Chronic obstructive pulmonary disease with acute lower respiratory infection: Secondary | ICD-10-CM | POA: Diagnosis not present

## 2023-04-11 LAB — GLUCOSE, CAPILLARY
Glucose-Capillary: 122 mg/dL — ABNORMAL HIGH (ref 70–99)
Glucose-Capillary: 148 mg/dL — ABNORMAL HIGH (ref 70–99)
Glucose-Capillary: 266 mg/dL — ABNORMAL HIGH (ref 70–99)
Glucose-Capillary: 189 mg/dL — ABNORMAL HIGH (ref 70–99)

## 2023-04-11 MED ORDER — POTASSIUM CHLORIDE CRYS ER 20 MEQ PO TBCR
40.0000 meq | EXTENDED_RELEASE_TABLET | Freq: Once | ORAL | Status: AC
  Administered 2023-04-11: 40 meq via ORAL
  Filled 2023-04-11: qty 2

## 2023-04-11 MED ORDER — FUROSEMIDE 10 MG/ML IJ SOLN
40.0000 mg | Freq: Two times a day (BID) | INTRAMUSCULAR | Status: DC
  Administered 2023-04-11 – 2023-04-13 (×6): 40 mg via INTRAVENOUS
  Filled 2023-04-11 (×6): qty 4

## 2023-04-11 NOTE — Plan of Care (Signed)
Problem: Health Behavior/Discharge Planning: Goal: Ability to manage health-related needs will improve Outcome: Progressing

## 2023-04-11 NOTE — Progress Notes (Signed)
CPT on hold for 16:00 due to pt just doing vest at 14:00. Pt next CPT will be 20:00.

## 2023-04-11 NOTE — Progress Notes (Signed)
PROGRESS NOTE Mario [name]  VHQ:[id number] DOB: December 11, 1950 DOA: 04/01/2023 PCP: [name]  Brief Narrative/Hospital Course: 55 yom w/ COPD, chronic hypoxic respiratory failure on 5 L O2 [place], history of PE on Eliquis, history of left lung cancer (s/p radiation), T2DM, HTN, BPH, GAD admitted on 1/17-2/1 for hypoxic respiratory failure/pneumonia/RSV infection and COPD exacerbation presented in the same day of discharge with persistent dyspnea in setting of COPD exacerbation, CAP, RSV infection. In the ED chest x-ray revealed changing x-ray finding of small right effusion, chronic pleural and consolidative process in the left base.  Was hemodynamically stable.  Patient complained of ongoing shortness of breath cough and hemoptysis-Eliquis was discontinued seen by pulmonary. Due to persistent hematemesis 2/6-CT chest with contrast done>shows stable large airspace opacity left lower lobe, lingular consistent with pneumonia or possibly fibrosis from prior radiation treatment, small loculated fluid collection seen laterally lung base, right lower lobe opacity slightly improved. Per pulmonary Limited endobronchial options given his high oxygen requirement at 5 L [place] no good endobronchial options for diffuse disease either. Patient continued to have cough along with hemoptysis also having constipation last bowel movement 2/6   Subjective: Patient seen examined this morning  Respiratory status about the same on 5 L oxygen no worsening breathing  Still has cough with hemoptysis   Assessment and Plan: Principal Problem:   COPD with acute bronchitis (HCC) Active Problems:   Chronic respiratory failure with hypoxia, on home O2 therapy (HCC) - 4 L/min   DM2 (diabetes mellitus, type 2) (HCC)   History of pulmonary embolus (PE)   BPH (benign prostatic hyperplasia)   OSA (obstructive sleep apnea)   Hypertension associated with diabetes (HCC)   COPD exacerbation (HCC)   Hemoptysis   Acute  exacerbation of end-stage COPD Acute on chronic hypoxic respiratory failure-on 5 L [place] at baseline CAP/RSV infection Bronchitis Recent admission 1/17- 2/1 for same: Patient with RSV infection and CAP with subsequent COPD exacerbation and hypoxic respiratory failure acute on chronic, readmitted same day of discharge after 2 wks of hospitalization.Initial leukocytosis resolved, BNP and troponin negative x 2. Respiratory status stable on 5 L [place] and slow to recover typical of RSV Continue current treatment with Brovana, Pulmicort BID, and steroid taper- decrease dose by 10 mg every week, bronchodilators.  Doxycycline added 2/10, starting Lasix 2/11.  Hemoptysis  Hemoptysis likely due to DOAC in the setting of CAP/RSV infection.  Still with ongoing hemoptysis  CT chest with contrast done>shows stable large airspace opacity left lower lobe, lingular consistent with pneumonia or possibly fibrosis from prior radiation treatment, small loculated fluid collection seen laterally lung base, right lower lobe opacity slightly improved. Per pulmonary Limited endobronchial options given his high oxygen requirement at 5 L  no good endobronchial options for diffuse disease either.  Still has ongoing hemoptysis continue plan of care and pulmonary.  Continue doxycycline and diuretics added today cont Tranexamic acid nebs tid  History of PE: Remote history of PE on Eliquis- but given his history of lung cancer-was maintained on Eliquis. Eliquis discontinued due to hemoptysis for now-Per pulmonary discontinue indefinitely.CT angio chest on last admission 1/17 no PE  Lung cancer: Not currently on treatment   Type 2 diabetes: With uncontrolled hyperglycemia blood sugar at time > 200 but relatively controlled while on steroids.Keep on SSI,holding metformin  Recent Labs  Lab 04/10/23 1132 04/10/23 1645 04/10/23 2131 04/11/23 0729 04/11/23 1148  GLUCAP 140* 265* 207* 122* 148*   Hypertension: Controlled cont  diltiazem 180 mg  daily.   BPH: Continue Flomax.   GAD/insomnia: Mood stable. He is on multiple medication, continue home bupropion, BuSpar, Cymbalta, Trileptal, trazodone.  Resume his Flexeril  Hyponatremia: Resolved    OSA: CPAP nightly.  Constipation: No BM since last Saturday despite getting MiraLAX Dulcolax suppository.  Will order lactulose enema  Obesity: Patient's Body mass index is 38.41 kg/m. : Will benefit with PCP follow-up, weight loss  healthy lifestyle and outpatient sleep evaluation.   DVT prophylaxis: Place and maintain sequential compression device Start: 04/05/23 1342 Code Status:   Code Status: Full Code Family Communication: plan of care discussed with patient at bedside. Patient status is: Remains hospitalized because of severity of illness Level of care: Med-Surg   Dispo: The patient is from: home with a roommate            Anticipated disposition: Home once hemoptysis is better  objective: Vitals last 24 hrs: Vitals:   04/10/23 2135 04/11/23 0622 04/11/23 1001 04/11/23 1011  BP: (!) 152/83 136/64    Pulse: (!) 105 80    Resp: 18 18    Temp: 99.7 F (37.6 C) 98.4 F (36.9 C)    TempSrc: Oral Oral    SpO2: 97% 94% 97% 97%  Weight:      Height:       Weight change:   Physical Examination: General exam: alert awake, oriented at baseline, older than stated age HEENT:Oral mucosa moist, Ear/Nose WNL grossly Respiratory system: Bilaterally diminished BS,no use of accessory muscle Cardiovascular system: S1 & S2 +, No JVD. Gastrointestinal system: Abdomen soft,NT,ND, BS+ Nervous System: Alert, awake, moving all extremities,and following commands. Extremities: LE edema neg,distal peripheral pulses palpable and warm.  Skin: No rashes,no icterus. MSK: Normal muscle bulk,tone, power   Medications reviewed:  Scheduled Meds:  arformoterol  15 mcg Nebulization BID   ascorbic acid  500 mg Oral Daily   aspirin EC  81 mg Oral Daily   azelastine  2  spray Each Nare BID   budesonide (PULMICORT) nebulizer solution  0.25 mg Nebulization BID   buPROPion  300 mg Oral q morning   busPIRone  15 mg Oral Daily   cholecalciferol  1,000 Units Oral Daily   diltiazem  180 mg Oral Daily   docusate sodium  200 mg Oral BID   doxycycline  100 mg Oral Q12H   DULoxetine  60 mg Oral BID   fluticasone  2 spray Each Nare Daily   folic acid  1 mg Oral Daily   furosemide  40 mg Intravenous Q12H   gabapentin  100 mg Oral TID   guaiFENesin  1,200 mg Oral BID   insulin aspart  0-5 Units Subcutaneous QHS   insulin aspart  0-9 Units Subcutaneous TID WC   magnesium oxide  200 mg Oral Daily   melatonin  3 mg Oral QHS   multivitamin with minerals  1 tablet Oral Daily   Oxcarbazepine  600 mg Oral TID   pantoprazole  40 mg Oral BID   polyethylene glycol  17 g Oral TID   potassium chloride  40 mEq Oral Once   predniSONE  30 mg Oral Q breakfast   revefenacin  175 mcg Nebulization Daily   sodium chloride  2 spray Each Nare BID   sodium chloride HYPERTONIC  4 mL Nebulization BID   tamsulosin  0.8 mg Oral Daily   tranexamic acid  500 mg Nebulization Q8H   traZODone  75 mg Oral QHS   Continuous Infusions:  Diet Order             Diet regular Fluid consistency: Thin  Diet effective now                  Intake/Output Summary (Last 24 hours) at 04/11/2023 1152 Last data filed at 04/11/2023 1100 Gross per 24 hour  Intake 2570 ml  Output 2850 ml  Net -280 ml   Net IO Since Admission: -7,290 mL [04/11/23 1152]  Wt Readings from Last 3 Encounters:  04/05/23 135.7 kg  03/17/23 (!) 141.3 kg  01/26/23 127 kg     Unresulted Labs (From admission, onward)     Start     Ordered   04/11/23 1145  Expectorated Sputum Assessment w Gram Stain, Rflx to Resp Cult  Once,   R        04/11/23 1144          Data Reviewed: I have personally reviewed following labs and imaging studies CBC: Recent Labs  Lab 04/05/23 0458 04/07/23 0501  WBC 12.7* 10.1  HGB  13.8 12.8*  HCT 41.8 39.2  MCV 91.3 92.0  PLT 278 263   Basic Metabolic Panel:  Recent Labs  Lab 04/05/23 0458 04/06/23 0510 04/07/23 0501 04/08/23 0537  NA 129* 130* 131* 131*  K 3.9 3.9 3.7 3.7  CL 94* 94* 96* 95*  CO2 26 26 27 29   GLUCOSE 138* 126* 157* 156*  BUN 17 18 15 16   CREATININE 0.59* 0.80 0.76 0.76  CALCIUM 8.7* 8.7* 8.4* 8.5*   GFR: Estimated Creatinine Clearance: 124.1 mL/min (by C-G formula based on SCr of 0.76 mg/dL). Liver Function Tests:  No results for input(s): "AST", "ALT", "ALKPHOS", "BILITOT", "PROT", "ALBUMIN" in the last 168 hours.  No results for input(s): "HGBA1C" in the last 72 hours. Recent Labs  Lab 04/10/23 1132 04/10/23 1645 04/10/23 2131 04/11/23 0729 04/11/23 1148  GLUCAP 140* 265* 207* 122* 148*   No results for input(s): "CHOL", "HDL", "LDLCALC", "TRIG", "CHOLHDL", "LDLDIRECT" in the last 72 hours. No results for input(s): "TSH", "T4TOTAL", "FREET4", "T3FREE", "THYROIDAB" in the last 72 hours. Sepsis Labs: Recent Labs  Lab 04/06/23 1810  PROCALCITON <0.10   No results found for this or any previous visit (from the past 240 hours).   Antimicrobials/Microbiology: Anti-infectives (From admission, onward)    Start     Dose/Rate Route Frequency Ordered Stop   04/10/23 1000  doxycycline (VIBRA-TABS) tablet 100 mg        100 mg Oral Every 12 hours 04/10/23 0728           Component Value Date/Time   SDES EXPECTORATED SPUTUM 03/24/2023 1656   SDES  03/24/2023 1656    EXPECTORATED SPUTUM Performed at [facility], [address], [place], [place] [address]    SPECREQUEST NONE 03/24/2023 1656   SPECREQUEST  03/24/2023 1656    NONE Reflexed from [id number] Performed at [facility], [address] [place], [place] [address]    CULT  03/24/2023 1656    MODERATE Normal respiratory flora-no Staph aureus or Pseudomonas seen Performed at [facility], [address] [place], [place]  [address]    REPTSTATUS 03/24/2023 FINAL 03/24/2023 1656   REPTSTATUS 03/27/2023 FINAL 03/24/2023 1656     Radiology Studies: No results found.     LOS: 7 days   Total time spent in review of labs and imaging, patient evaluation, formulation of plan, documentation and communication with family:   Lanae Boast, MD  [facility]  04/11/2023, 11:52 AM

## 2023-04-11 NOTE — Progress Notes (Signed)
Mobility Specialist - Progress Note   04/11/23 1429  Mobility  Activity Ambulated with assistance in hallway  Level of Assistance Contact guard assist, steadying assist  Assistive Device None  Distance Ambulated (ft) 100 ft  Range of Motion/Exercises Active  Activity Response Tolerated well  Mobility Referral Yes  Mobility visit 1 Mobility  Mobility Specialist Start Time (ACUTE ONLY) 1418  Mobility Specialist Stop Time (ACUTE ONLY) 1429  Mobility Specialist Time Calculation (min) (ACUTE ONLY) 11 min   Received in bed and agreed to mobility. On 6L and desat to 85%. Recovered to 92% in under 2 minutes. No other issues. Returned to bed with all needs met.  Marilynne Halsted Mobility Specialist

## 2023-04-11 NOTE — Progress Notes (Signed)
Occupational Therapy Treatment Patient Details Name: Mario [name] MRN: [id number] DOB: May 03, 1950 Today's Date: 04/11/2023   History of present illness Patient is a 73 year old male presents to the ED for evaluation of shortness of breath. pt was discharged earlier same day after being admitted initially on 03/17/2023 for acute on chronic hypoxic respiratory failure in the setting of community-acquired pneumonia, RSV infection, and COPD exacerbation.    medical history significant for COPD, chronic hypoxic respiratory failure on 5 L O2 [place], history of PE on Eliquis, history of left lung cancer (s/p radiation), T2DM, HTN, BPH, GAD   OT comments  Patient appears to be at baseline for ADLs at this time with patient completing toileting himself in room. Patient was asking therapist for 12 inch stool to put foot up on in standing to complete toileting hygiene. Patient reported using tub ledge at home for this. Patient declined to attempt to use toileting buddy or any alternative methods that OT attempted to educate patient on. Patient endorsed that he does not need any further OT at this time. OT to sign off.       If plan is discharge home, recommend the following:  Assistance with cooking/housework;Assist for transportation;Help with stairs or ramp for entrance;Direct supervision/assist for medications management   Equipment Recommendations  None recommended by OT       Precautions / Restrictions Precautions Precautions: Fall Precaution/Restrictions Comments: monitor O2 and HR/5L at baseline Restrictions Weight Bearing Restrictions Per Provider Order: No              ADL either performed or assessed with clinical judgement   ADL Overall ADL's : Needs assistance/impaired             Toileting - Clothing Manipulation Details (indicate cue type and reason): patient requesting for a 1 foot stool to put foot on. patient reported at home to complete toileting hygiene he places his leg  up onto the tub to reach this area. patient was educated on safer methods to use like toileting buddy or bidet while seated to prevent safety risk with standing. patient declined to attempt any of these methods. patient reported he would be able to complete this at home. patient ending session with RT coming into the room for chest precussion session completed.       General ADL Comments: patient endorsed that there was not more ADLs to review at this time. nurse made aware.      Cognition Arousal: Alert Behavior During Therapy: WFL for tasks assessed/performed, Flat affect                        Pertinent Vitals/ Pain       Pain Assessment Pain Assessment: No/denies pain            Progress Toward Goals  OT Goals(current goals can now be found in the care plan section)  Progress towards OT goals: Goals met/education completed, patient discharged from OT     Plan         AM-PAC OT "6 Clicks" Daily Activity     Outcome Measure   Help from another person eating meals?: None Help from another person taking care of personal grooming?: None Help from another person toileting, which includes using toliet, bedpan, or urinal?: A Little Help from another person bathing (including washing, rinsing, drying)?: A Little Help from another person to put on and taking off regular upper body clothing?: None Help from another person  to put on and taking off regular lower body clothing?: A Little 6 Click Score: 21    End of Session Equipment Utilized During Treatment: Oxygen      Activity Tolerance Patient tolerated treatment well   Patient Left in bed;with call bell/phone within reach;Other (comment) (with RT in room)   Nurse Communication Other (comment) (patient delcining to engage in alternative methods for ADLs.)        Time: 1355-1403 OT Time Calculation (min): 8 min  Charges: OT General Charges $OT Visit: 1 Visit OT Treatments $Self Care/Home Management : 8-22  mins  Rosalio Loud, MS Acute Rehabilitation Department Office# [phone]   Selinda Flavin 04/11/2023, 2:17 PM

## 2023-04-11 NOTE — Progress Notes (Signed)
   NAME:  Mario [name], MRN:  [id number], DOB:  1951-02-03, LOS: 7 ADMISSION DATE:  04/01/2023, CONSULTATION DATE:  2/3 REFERRING MD:  KC, CHIEF COMPLAINT:  hemoptysis    History of Present Illness:  73 year old male w/ chronic resp failure in setting of COPD (5lpm baseline) remotely seen by Icard (not seen in office > 2 yrs). Admitted from 1/17 to 2/1 w/ what AECOPD in setting of RSV infection and prob CAP after treated w/ ABX and systemic steroids.. was sent home on 5 liters rest and instructed to increase to 6 lpm w/ activity. Apparently when home rested then got up to move, walked up flight of stairs, sats 80s, marked dyspnea, HR 130s. Reported back to the ER that evening (same day of discharge)  In ER  Wbc 12.4, BNP 29/ CXR w/ right basilar band-like atx, and persistent left sided airspace disease.  Continued on oxygen, scheduled BDs ordered, got IV steroids followed by orals.  On 2/1 started having episodes of hemoptysis.  This has been fairly persistent.  It has been bright red, about a teaspoon in size happening fairly frequently at least once every 2 hours and has persisted over night into 2/3 he also continues to have resting dyspnea and thus PCCM asked to eval  Pertinent  Medical History  Chronic resp failure 5 lpm baseline, remote PE (on DOAC), lung cancer s/p XRT, type II DM, HTN, BPH, GAD.grade I diastolic dysfxn ef 60-65% Significant Hospital Events: Including procedures, antibiotic start and stop dates in addition to other pertinent events   2/1 discharged and readmitted following treatment for RSV infection possible And AECOPD placed back on steroids and nebulizers 2/3 pulm consulted for shortness of breath and hemoptysis  Interim History / Subjective:  Coughing up blood again. Bright small volume. Feels no different than admission.  Objective   Blood pressure 136/64, pulse 80, temperature 98.4 F (36.9 C), temperature source Oral, resp. rate 18, height 6\' 2"  (1.88 m),  weight 135.7 kg, SpO2 97%.        Intake/Output Summary (Last 24 hours) at 04/11/2023 1145 Last data filed at 04/11/2023 1100 Gross per 24 hour  Intake 2570 ml  Output 2850 ml  Net -280 ml   Filed Weights   04/03/23 0500 04/04/23 0610 04/05/23 0500  Weight: (!) 140 kg 134.5 kg 135.7 kg   Physical Exam: No distress Improved upper airway sounds today Ext warm Trace edema   Resolved Hospital Problem list     Assessment & Plan:   Hemoptysis: Daily small volume for a week now despite AC interruption Chronic hypoxic resp failure 2/2 COPD s/p recent RSV URI and CAP superimposed on underlying remote lung cancer w/ chronic scarring and volume loss of left lower lobe PE (2022) Obstructive sleep apnea-CPAP being refused by patient per RT notes  - Trial of doxy x 7 days - Give some lasix as well to reduce transpulmonary gradient - Check sputum culture - Hold AC indefinitely for now - Nebs as ordered including TXA - Prednisone taper as ordered - Chest X-Mario in AM - Progressive mobility: this is hurting him as much as anything  Discussed utility of bronch and localized therapy with him, he wants to hold off given risk for prolonged mechanical ventilation post procedure  Lorin Glass, MD [facility] Pulmonary/Critical Care Medicine 04/11/2023 11:45 AM   See Loretha Stapler for personal pager For hours between 7 PM to 7 AM, please call Elink for urgent questions

## 2023-04-12 ENCOUNTER — Inpatient Hospital Stay (HOSPITAL_COMMUNITY): Payer: No Typology Code available for payment source

## 2023-04-12 DIAGNOSIS — J209 Acute bronchitis, unspecified: Secondary | ICD-10-CM | POA: Diagnosis not present

## 2023-04-12 DIAGNOSIS — J440 Chronic obstructive pulmonary disease with acute lower respiratory infection: Secondary | ICD-10-CM | POA: Diagnosis not present

## 2023-04-12 LAB — EXPECTORATED SPUTUM ASSESSMENT W GRAM STAIN, RFLX TO RESP C

## 2023-04-12 LAB — GLUCOSE, CAPILLARY
Glucose-Capillary: 137 mg/dL — ABNORMAL HIGH (ref 70–99)
Glucose-Capillary: 154 mg/dL — ABNORMAL HIGH (ref 70–99)
Glucose-Capillary: 286 mg/dL — ABNORMAL HIGH (ref 70–99)
Glucose-Capillary: 174 mg/dL — ABNORMAL HIGH (ref 70–99)

## 2023-04-12 MED ORDER — POTASSIUM CHLORIDE CRYS ER 20 MEQ PO TBCR
40.0000 meq | EXTENDED_RELEASE_TABLET | Freq: Every day | ORAL | Status: AC
  Administered 2023-04-12 – 2023-04-13 (×2): 40 meq via ORAL
  Filled 2023-04-12 (×2): qty 2

## 2023-04-12 NOTE — Progress Notes (Signed)
   NAME:  Mario [name], MRN:  [id number], DOB:  05/26/1950, LOS: 8 ADMISSION DATE:  04/01/2023, CONSULTATION DATE:  2/3 REFERRING MD:  KC, CHIEF COMPLAINT:  hemoptysis    History of Present Illness:  73 year old male w/ chronic resp failure in setting of COPD (5lpm baseline) remotely seen by Icard (not seen in office > 2 yrs). Admitted from 1/17 to 2/1 w/ what AECOPD in setting of RSV infection and prob CAP after treated w/ ABX and systemic steroids.. was sent home on 5 liters rest and instructed to increase to 6 lpm w/ activity. Apparently when home rested then got up to move, walked up flight of stairs, sats 80s, marked dyspnea, HR 130s. Reported back to the ER that evening (same day of discharge)  In ER  Wbc 12.4, BNP 29/ CXR w/ right basilar band-like atx, and persistent left sided airspace disease.  Continued on oxygen, scheduled BDs ordered, got IV steroids followed by orals.  On 2/1 started having episodes of hemoptysis.  This has been fairly persistent.  It has been bright red, about a teaspoon in size happening fairly frequently at least once every 2 hours and has persisted over night into 2/3 he also continues to have resting dyspnea and thus PCCM asked to eval  Pertinent  Medical History  Chronic resp failure 5 lpm baseline, remote PE (on DOAC), lung cancer s/p XRT, type II DM, HTN, BPH, GAD.grade I diastolic dysfxn ef 60-65% Significant Hospital Events: Including procedures, antibiotic start and stop dates in addition to other pertinent events   2/1 discharged and readmitted following treatment for RSV infection possible And AECOPD placed back on steroids and nebulizers 2/3 pulm consulted for shortness of breath and hemoptysis  Interim History / Subjective:  No change.  Objective   Blood pressure (!) 148/78, pulse 80, temperature 97.9 F (36.6 C), temperature source Oral, resp. rate 18, height 6\' 2"  (1.88 m), weight 135.7 kg, SpO2 93%.        Intake/Output Summary (Last 24  hours) at 04/12/2023 0948 Last data filed at 04/12/2023 0756 Gross per 24 hour  Intake 2640 ml  Output 4150 ml  Net -1510 ml   Filed Weights   04/03/23 0500 04/04/23 0610 04/05/23 0500  Weight: (!) 140 kg 134.5 kg 135.7 kg   Physical Exam: No distress Still gurgling upper airway sounds c/w retained secretions Moves to command Globally weak Aox3    Resolved Hospital Problem list     Assessment & Plan:   Hemoptysis: Daily small volume for a week now despite AC interruption Chronic hypoxic resp failure 2/2 COPD s/p recent RSV URI and CAP superimposed on underlying remote lung cancer w/ chronic scarring and volume loss of left lower lobe PE (2022) Obstructive sleep apnea-CPAP being refused by patient per RT notes  - Trial of doxy x 7 days ongoing - Continue to push lasix - Check sputum culture: sent last night, f/u - Hold AC indefinitely for now - Nebs as ordered including TXA - Prednisone taper as ordered - Progressive mobility  Will see if sputum grows anything treatable.  If we are in same spot Monday we will perform bronch at [facility] to see if anything localized that we can spray or cryo.  Lorin Glass, MD [facility] Pulmonary/Critical Care Medicine 04/12/2023 9:48 AM   See Amion for personal pager For hours between 7 PM to 7 AM, please call Elink for urgent questions

## 2023-04-12 NOTE — Progress Notes (Signed)
TOC CSW continues to monitor for possible dc needs.  Please place consult if needs arise.  Brendt Dible, LCSW

## 2023-04-12 NOTE — Progress Notes (Signed)
PROGRESS NOTE Mario [name]  ZOX:[id number] DOB: 09-Jun-1950 DOA: 04/01/2023 PCP: [name]  Brief Narrative/Hospital Course: 50 yom w/ COPD, chronic hypoxic respiratory failure on 5 L O2 [place], history of PE on Eliquis, history of left lung cancer (s/p radiation), T2DM, HTN, BPH, GAD admitted on 1/17-2/1 for hypoxic respiratory failure/pneumonia/RSV infection and COPD exacerbation presented in the same day of discharge with persistent dyspnea in setting of COPD exacerbation, CAP, RSV infection. In the ED chest x-ray revealed changing x-ray finding of small right effusion, chronic pleural and consolidative process in the left base.  Was hemodynamically stable.  Patient complained of ongoing shortness of breath cough and hemoptysis-Eliquis was discontinued seen by pulmonary. Due to persistent hemoptysis 2/6-CT chest with contrast done>shows stable large airspace opacity left lower lobe, lingular consistent with pneumonia or possibly fibrosis from prior radiation treatment, small loculated fluid collection seen laterally lung base, right lower lobe opacity slightly improved. Per pulmonary Limited endobronchial options given his high oxygen requirement at 5 L [place] no good endobronchial options for diffuse disease either.Patient continued to have cough along with hemoptysis-being managed with Tranexamic acid nebs. Empirically started on doxycycline, diuretics IS.   Subjective: Seen and examined Resting comfortably this morning no hemoptysis Voiding well with Lasix.    Assessment and Plan: Principal Problem:   COPD with acute bronchitis (HCC) Active Problems:   Chronic respiratory failure with hypoxia, on home O2 therapy (HCC) - 4 L/min   DM2 (diabetes mellitus, type 2) (HCC)   History of pulmonary embolus (PE)   BPH (benign prostatic hyperplasia)   OSA (obstructive sleep apnea)   Hypertension associated with diabetes (HCC)   COPD exacerbation (HCC)   Hemoptysis   Acute exacerbation  of end-stage COPD Acute on chronic hypoxic respiratory failure-on 5 L [place] at baseline CAP/RSV infection Bronchitis Recent admission 1/17- 2/1 for same Hemoptysis in the setting of infection/COPD  while on DOAC: Patient with RSV infection and CAP with subsequent COPD exacerbation and hypoxic respiratory failure acute on chronic, readmitted same day of discharge after 2 wks of hospitalization.Initial leukocytosis resolved, BNP and troponin negative x 2. Respiratory status stable on 5 L [place] and slow to recover typical of RSV Main issue currently ongoing cough and hemoptysis  CT chest with contrast done>showed stable large airspace opacity left lower lobe, lingular consistent with pneumonia or possibly fibrosis from prior radiation treatment, small loculated fluid collection seen laterally lung base, right lower lobe opacity slightly improved. Continue current treatment with Brovana, Pulmicort BID, and steroid taper- decrease dose by 10 mg every week, bronchodilators. Cont Tranexamic acid nebs tid Continue doxycycline x 7 days ( 2/10>>) cont Lasix  (2/11>>) Follow respiratory culture from 2/11  History of PE: Remote history of PE on Eliquis- but given his history of lung cancer-was maintained on Eliquis. Eliquis discontinued due to hemoptysis for now-Per pulmonary discontinue indefinitely.CT angio chest on last admission 1/17 no PE  Lung cancer: Not currently on treatment   Type 2 diabetes: With uncontrolled hyperglycemia blood sugar at time > 200 but relatively controlled while on steroids.Keep on SSI,holding metformin  Recent Labs  Lab 04/11/23 0729 04/11/23 1148 04/11/23 1623 04/11/23 2152 04/12/23 0747  GLUCAP 122* 148* 266* 189* 137*   Hypertension: Controlled on Diltiazem 180 mg daily.   BPH: Continue Flomax.  Voiding well   GAD/insomnia: Mood stable. He is on multiple medication, continue home bupropion, BuSpar, Cymbalta, Trileptal, trazodone.  Resume his  Flexeril  Hyponatremia: Resolved    OSA: CPAP nightly.  Constipation: Having bowel movement continue laxatives MiraLAX and stool softener as ordered.  Needed lactulose enema/dolcola PR  Obesity: Patient's Body mass index is 38.41 kg/m. : Will benefit with PCP follow-up, weight loss  healthy lifestyle and outpatient sleep evaluation.   DVT prophylaxis: Place and maintain sequential compression device Start: 04/05/23 1342 Code Status:   Code Status: Full Code Family Communication: plan of care discussed with patient at bedside. Patient status is: Remains hospitalized because of severity of illness Level of care: Med-Surg   Dispo: The patient is from: home with a roommate who had Rsv infection.            Anticipated disposition: Home once hemoptysis is better  objective: Vitals last 24 hrs: Vitals:   04/11/23 2156 04/11/23 2331 04/12/23 0556 04/12/23 1003  BP: (!) 124/58  (!) 148/78   Pulse: 80  80   Resp: 18  18   Temp: 98 F (36.7 C)  97.9 F (36.6 C)   TempSrc: Oral  Oral   SpO2: 93% 94% 93% 95%  Weight:      Height:       Weight change:   Physical Examination: General exam: alert awake, oriented at baseline, older than stated age HEENT:Oral mucosa moist, Ear/Nose WNL grossly Respiratory system: Bilaterally diminished BS,no use of accessory muscle Cardiovascular system: S1 & S2 +, No JVD. Gastrointestinal system: Abdomen soft,NT,ND, BS+ Nervous System: Alert, awake, moving all extremities,and following commands. Extremities: LE edema neg,distal peripheral pulses palpable and warm.  Skin: No rashes,no icterus. MSK: Normal muscle bulk,tone, power   Medications reviewed:  Scheduled Meds:  arformoterol  15 mcg Nebulization BID   ascorbic acid  500 mg Oral Daily   aspirin EC  81 mg Oral Daily   azelastine  2 spray Each Nare BID   budesonide (PULMICORT) nebulizer solution  0.25 mg Nebulization BID   buPROPion  300 mg Oral q morning   busPIRone  15 mg Oral Daily    cholecalciferol  1,000 Units Oral Daily   diltiazem  180 mg Oral Daily   docusate sodium  200 mg Oral BID   doxycycline  100 mg Oral Q12H   DULoxetine  60 mg Oral BID   fluticasone  2 spray Each Nare Daily   folic acid  1 mg Oral Daily   furosemide  40 mg Intravenous BID   gabapentin  100 mg Oral TID   guaiFENesin  1,200 mg Oral BID   insulin aspart  0-5 Units Subcutaneous QHS   insulin aspart  0-9 Units Subcutaneous TID WC   magnesium oxide  200 mg Oral Daily   melatonin  3 mg Oral QHS   multivitamin with minerals  1 tablet Oral Daily   Oxcarbazepine  600 mg Oral TID   pantoprazole  40 mg Oral BID   polyethylene glycol  17 g Oral TID   potassium chloride  40 mEq Oral Daily   predniSONE  30 mg Oral Q breakfast   revefenacin  175 mcg Nebulization Daily   sodium chloride  2 spray Each Nare BID   sodium chloride HYPERTONIC  4 mL Nebulization BID   tamsulosin  0.8 mg Oral Daily   tranexamic acid  500 mg Nebulization Q8H   traZODone  75 mg Oral QHS   Continuous Infusions:   Diet Order             Diet regular Fluid consistency: Thin  Diet effective now  Intake/Output Summary (Last 24 hours) at 04/12/2023 1100 Last data filed at 04/12/2023 0756 Gross per 24 hour  Intake 2160 ml  Output 3500 ml  Net -1340 ml   Net IO Since Admission: -8,630 mL [04/12/23 1100]  Wt Readings from Last 3 Encounters:  04/05/23 135.7 kg  03/17/23 (!) 141.3 kg  01/26/23 127 kg     Unresulted Labs (From admission, onward)    None     Data Reviewed: I have personally reviewed following labs and imaging studies CBC: Recent Labs  Lab 04/07/23 0501  WBC 10.1  HGB 12.8*  HCT 39.2  MCV 92.0  PLT 263   Basic Metabolic Panel:  Recent Labs  Lab 04/06/23 0510 04/07/23 0501 04/08/23 0537  NA 130* 131* 131*  K 3.9 3.7 3.7  CL 94* 96* 95*  CO2 26 27 29   GLUCOSE 126* 157* 156*  BUN 18 15 16   CREATININE 0.80 0.76 0.76  CALCIUM 8.7* 8.4* 8.5*   GFR: Estimated  Creatinine Clearance: 124.1 mL/min (by C-G formula based on SCr of 0.76 mg/dL). Liver Function Tests:  No results for input(s): "AST", "ALT", "ALKPHOS", "BILITOT", "PROT", "ALBUMIN" in the last 168 hours.  No results for input(s): "HGBA1C" in the last 72 hours. Recent Labs  Lab 04/11/23 0729 04/11/23 1148 04/11/23 1623 04/11/23 2152 04/12/23 0747  GLUCAP 122* 148* 266* 189* 137*   No results for input(s): "CHOL", "HDL", "LDLCALC", "TRIG", "CHOLHDL", "LDLDIRECT" in the last 72 hours. No results for input(s): "TSH", "T4TOTAL", "FREET4", "T3FREE", "THYROIDAB" in the last 72 hours. Sepsis Labs: Recent Labs  Lab 04/06/23 1810  PROCALCITON <0.10   Recent Results (from the past 240 hours)  Expectorated Sputum Assessment w Gram Stain, Rflx to Resp Cult     Status: None   Collection Time: 04/12/23  3:47 AM   Specimen: Expectorated Sputum  Result Value Ref Range Status   Specimen Description EXPECTORATED SPUTUM  Final   Special Requests NONE  Final   Sputum evaluation   Final    THIS SPECIMEN IS ACCEPTABLE FOR SPUTUM CULTURE Performed at [facility], [address], [place], [place] [address]    Report Status 04/12/2023 FINAL  Final  Culture, Respiratory w Gram Stain     Status: None (Preliminary result)   Collection Time: 04/12/23  3:47 AM  Result Value Ref Range Status   Specimen Description   Final    EXPECTORATED SPUTUM Performed at [facility], [address] [place], [place] [address]    Special Requests   Final    NONE Reflexed from [id number] Performed at [facility], [address], [place], [place] [address]    Gram Stain   Final    NO WBC SEEN RARE GRAM POSITIVE COCCI Performed at [facility], [address] [place], [place] [address]    Culture PENDING  Incomplete   Report Status PENDING  Incomplete     Antimicrobials/Microbiology: Anti-infectives (From admission, onward)    Start     Dose/Rate  Route Frequency Ordered Stop   04/10/23 1000  doxycycline (VIBRA-TABS) tablet 100 mg        100 mg Oral Every 12 hours 04/10/23 0728           Component Value Date/Time   SDES EXPECTORATED SPUTUM 04/12/2023 0347   SDES  04/12/2023 0347    EXPECTORATED SPUTUM Performed at [facility], [address] [place], [place] [address]    SPECREQUEST NONE 04/12/2023  4098   SPECREQUEST  04/12/2023 0347    NONE Reflexed from [id number] Performed at [facility], [address], [place], [place] [address]    CULT PENDING 04/12/2023 0347   REPTSTATUS 04/12/2023 FINAL 04/12/2023 0347   REPTSTATUS PENDING 04/12/2023 0347     Radiology Studies: DG Chest 1 View Result Date: 04/12/2023 CLINICAL DATA:  Hemoptysis EXAM: PORTABLE CHEST 1 VIEW COMPARISON:  04/01/2023 FINDINGS: Cardiac shadow is stable. Persistent bibasilar airspace opacities are noted left greater than right with associated small effusions. The overall appearance is stable from the prior study. No pneumothorax is seen. IMPRESSION: Stable appearance when compared with the previous exam. Electronically Signed   By: Alcide Clever M.D.   On: 04/12/2023 10:07       LOS: 8 days   Total time spent in review of labs and imaging, patient evaluation, formulation of plan, documentation and communication with family:   Lanae Boast, MD  [facility]  04/12/2023, 11:00 AM

## 2023-04-12 NOTE — Progress Notes (Signed)
Physical Therapy Treatment Patient Details Name: Mario [name] MRN: [id number] DOB: April 29, 1950 Today's Date: 04/12/2023   History of Present Illness Patient is a 73 year old male presents to the ED for evaluation of shortness of breath. pt was discharged earlier same day after being admitted initially on 03/17/2023 for acute on chronic hypoxic respiratory failure in the setting of community-acquired pneumonia, RSV infection, and COPD exacerbation.    medical history significant for COPD, chronic hypoxic respiratory failure on 5 L O2 [place], history of PE on Eliquis, history of left lung cancer (s/p radiation), T2DM, HTN, BPH, GAD    PT Comments  PT - Cognition Comments: AxO x 3 pleasant.  Lives with a Roommate who got sick first "RSV" Assisted with amb in hallway then to bathroom.  Pt required rest breaks between each activity.  LIMITED activity tolerance.   Pt plans to return home "not till Monday" stated Pt per MD   If plan is discharge home, recommend the following:     Can travel by private vehicle        Equipment Recommendations  None recommended by PT    Recommendations for Other Services       Precautions / Restrictions Precautions Precautions: Fall Precaution/Restrictions Comments: monitor O2 and HR/5L at baseline Restrictions Weight Bearing Restrictions Per Provider Order: No     Mobility  Bed Mobility Overal bed mobility: Modified Independent             General bed mobility comments: self able with increased time    Transfers Overall transfer level: Modified independent Equipment used: None               General transfer comment: self able with increased effort and freq rest breaks.  Limited activity tolerance.  3/4 dyspnea.  Also assisted to bathroom for a BM.  Assisted with peri care as pt stated "I can't reach".  "at home I have to put my one foot on ther tub".    Ambulation/Gait Ambulation/Gait assistance: Supervision Gait Distance (Feet): 47  Feet Assistive device: None Gait Pattern/deviations: Decreased stride length Gait velocity: decreased     General Gait Details: limited amb distance due to 3/4 dyspnea.  On 5 lts which is his base ave 90% with HR 112 and RR 34.  "been on oxygen for years", stated pt.   Stairs             Wheelchair Mobility     Tilt Bed    Modified Rankin (Stroke Patients Only)       Balance                                            Communication Communication Communication: No apparent difficulties  Cognition Arousal: Alert Behavior During Therapy: WFL for tasks assessed/performed                           PT - Cognition Comments: AxO x 3 pleasant.  Lives with a Roommate who got sick first "RSV"        Cueing    Exercises      General Comments        Pertinent Vitals/Pain Pain Assessment Pain Assessment: No/denies pain    Home Living  Prior Function            PT Goals (current goals can now be found in the care plan section) Progress towards PT goals: Progressing toward goals    Frequency    Min 1X/week      PT Plan      Co-evaluation              AM-PAC PT "6 Clicks" Mobility   Outcome Measure  Help needed turning from your back to your side while in a flat bed without using bedrails?: None Help needed moving from lying on your back to sitting on the side of a flat bed without using bedrails?: None Help needed moving to and from a bed to a chair (including a wheelchair)?: None Help needed standing up from a chair using your arms (e.g., wheelchair or bedside chair)?: None Help needed to walk in hospital room?: None Help needed climbing 3-5 steps with a railing? : A Little 6 Click Score: 23    End of Session Equipment Utilized During Treatment: Gait belt;Oxygen Activity Tolerance: Patient tolerated treatment well Patient left: in bed;with call bell/phone within reach Nurse  Communication: Mobility status PT Visit Diagnosis: Other abnormalities of gait and mobility (R26.89)     Time: 1610-9604 PT Time Calculation (min) (ACUTE ONLY): 24 min  Charges:    $Gait Training: 8-22 mins $Therapeutic Activity: 8-22 mins PT General Charges $$ ACUTE PT VISIT: 1 Visit                     Felecia Shelling  PTA Acute  Rehabilitation Services Office M-F          [phone]

## 2023-04-13 LAB — BASIC METABOLIC PANEL
Anion gap: 11 (ref 5–15)
BUN: 23 mg/dL (ref 8–23)
CO2: 29 mmol/L (ref 22–32)
Calcium: 8.9 mg/dL (ref 8.9–10.3)
Chloride: 97 mmol/L — ABNORMAL LOW (ref 98–111)
Creatinine, Ser: 0.82 mg/dL (ref 0.61–1.24)
GFR, Estimated: >60 mL/min (ref >60)
Glucose, Bld: 145 mg/dL — ABNORMAL HIGH (ref 70–99)
Potassium: 3.7 mmol/L (ref 3.5–5.1)
Sodium: 137 mmol/L (ref 135–145)

## 2023-04-13 LAB — GLUCOSE, CAPILLARY
Glucose-Capillary: 144 mg/dL — ABNORMAL HIGH (ref 70–99)
Glucose-Capillary: 158 mg/dL — ABNORMAL HIGH (ref 70–99)
Glucose-Capillary: 279 mg/dL — ABNORMAL HIGH (ref 70–99)
Glucose-Capillary: 178 mg/dL — ABNORMAL HIGH (ref 70–99)

## 2023-04-13 LAB — CBC
HCT: 42.7 % (ref 39.0–52.0)
Hemoglobin: 13.6 g/dL (ref 13.0–17.0)
MCH: 30 pg (ref 26.0–34.0)
MCHC: 31.9 g/dL (ref 30.0–36.0)
MCV: 94.3 fL (ref 80.0–100.0)
Platelets: 306 10*3/uL (ref 150–400)
RBC: 4.53 MIL/uL (ref 4.22–5.81)
RDW: 13.2 % (ref 11.5–15.5)
WBC: 12.1 10*3/uL — ABNORMAL HIGH (ref 4.0–10.5)
nRBC: 0 % (ref 0.0–0.2)

## 2023-04-13 LAB — STREP PNEUMONIAE URINARY ANTIGEN: Strep Pneumo Urinary Antigen: NEGATIVE

## 2023-04-13 MED ORDER — MAGNESIUM SULFATE 2 GM/50ML IV SOLN
2.0000 g | Freq: Once | INTRAVENOUS | Status: DC
  Filled 2023-04-13: qty 50

## 2023-04-13 MED ORDER — VANCOMYCIN HCL IN DEXTROSE 1-5 GM/200ML-% IV SOLN
1000.0000 mg | Freq: Once | INTRAVENOUS | Status: AC
  Administered 2023-04-13: 1000 mg via INTRAVENOUS
  Filled 2023-04-13: qty 200

## 2023-04-13 MED ORDER — VANCOMYCIN HCL 1750 MG/350ML IV SOLN
1750.0000 mg | Freq: Once | INTRAVENOUS | Status: AC
  Administered 2023-04-13: 1750 mg via INTRAVENOUS
  Filled 2023-04-13: qty 350

## 2023-04-13 MED ORDER — MAGNESIUM SULFATE 2 GM/50ML IV SOLN
2.0000 g | Freq: Once | INTRAVENOUS | Status: AC
  Administered 2023-04-13: 2 g via INTRAVENOUS

## 2023-04-13 MED ORDER — VANCOMYCIN HCL 1250 MG/250ML IV SOLN
1250.0000 mg | Freq: Two times a day (BID) | INTRAVENOUS | Status: AC
  Administered 2023-04-14 – 2023-04-19 (×11): 1250 mg via INTRAVENOUS
  Filled 2023-04-13 (×12): qty 250

## 2023-04-13 MED ORDER — PIPERACILLIN-TAZOBACTAM 3.375 G IVPB
3.3750 g | Freq: Three times a day (TID) | INTRAVENOUS | Status: AC
  Administered 2023-04-13 – 2023-04-19 (×20): 3.375 g via INTRAVENOUS
  Filled 2023-04-13 (×20): qty 50

## 2023-04-13 NOTE — Progress Notes (Signed)
Mobility Specialist - Progress Note   04/13/23 0940  Oxygen Therapy  SpO2 (!) 87 %  O2 Device Nasal Cannula  O2 Flow Rate (L/min) 6 L/min  Patient Activity (if Appropriate) Ambulating  Mobility  Activity Ambulated independently in hallway  Level of Assistance Independent  Assistive Device None  Distance Ambulated (ft) 80 ft  Activity Response Tolerated well  Mobility Referral Yes  Mobility visit 1 Mobility  Mobility Specialist Start Time (ACUTE ONLY) [id number]  Mobility Specialist Stop Time (ACUTE ONLY) 0939  Mobility Specialist Time Calculation (min) (ACUTE ONLY) 12 min   Pt received EOB and agreeable to mobility. Pt coughing throughout session. Upon returning to room, pt desat to 87%. Encouraged seated pursed lip breaths bringing SpO2 to 90%. No complaints during session. Pt to EOB after session with all needs met.    Pre-mobility: 106 HR, 93% SpO2 (6L [place]) During mobility: 121 HR, 93% SpO2 (6L [place]) Post-mobility: 110 HR, 87-90% SPO2 (6L [place])  Chief Technology Officer

## 2023-04-13 NOTE — Progress Notes (Signed)
NAME:  Mario [name], MRN:  [id number], DOB:  12/10/1950, LOS: 9 ADMISSION DATE:  04/01/2023, CONSULTATION DATE:  2/3 REFERRING MD:  KC, CHIEF COMPLAINT:  hemoptysis    History of Present Illness:  73 year old male w/ chronic resp failure in setting of COPD (5lpm baseline) remotely seen by [facility] (not seen in office > 2 yrs). Admitted from 1/17 to 2/1 w/ what AECOPD in setting of RSV infection and prob CAP after treated w/ ABX and systemic steroids.. was sent home on 5 liters rest and instructed to increase to 6 lpm w/ activity. Apparently when home rested then got up to move, walked up flight of stairs, sats 80s, marked dyspnea, HR 130s. Reported back to the ER that evening (same day of discharge)  In ER  Wbc 12.4, BNP 29/ CXR w/ right basilar band-like atx, and persistent left sided airspace disease.  Continued on oxygen, scheduled BDs ordered, got IV steroids followed by orals.  On 2/1 started having episodes of hemoptysis.  This has been fairly persistent.  It has been bright red, about a teaspoon in size happening fairly frequently at least once every 2 hours and has persisted over night into 2/3 he also continues to have resting dyspnea and thus PCCM asked to eval  Pertinent  Medical History  Chronic resp failure 5 lpm baseline, remote PE (on DOAC), lung cancer s/p XRT, type II DM, HTN, BPH, GAD.grade I diastolic dysfxn ef 60-65% Significant Hospital Events: Including procedures, antibiotic start and stop dates in addition to other pertinent events   2/1 discharged and readmitted following treatment for RSV infection possible And AECOPD placed back on steroids and nebulizers 2/3 pulm consulted for shortness of breath and hemoptysis  Interim History / Subjective:  Still coughing up blood. Still DOE.  Objective   Blood pressure 116/82, pulse 81, temperature 97.9 F (36.6 C), resp. rate 17, height 6\' 2"  (1.88 m), weight 135.7 kg, SpO2 (!) 87%.    FiO2 (%):  [40 %] 40 %   Intake/Output  Summary (Last 24 hours) at 04/13/2023 1139 Last data filed at 04/13/2023 1030 Gross per 24 hour  Intake 1690 ml  Output 2950 ml  Net -1260 ml   Filed Weights   04/03/23 0500 04/04/23 0610 04/05/23 0500  Weight: (!) 140 kg 134.5 kg 135.7 kg   Physical Exam: No distress Still gurgling upper airway sounds c/w retained secretions, same again today Moves to command Globally weak Aox3 Age-related skin changes    Resolved Hospital Problem list     Assessment & Plan:   Hemoptysis: Daily small volume for a week now despite AC interruption, doxycycline, diuresis, and steroids Chronic hypoxic resp failure 2/2 COPD s/p recent RSV URI and CAP superimposed on underlying remote lung cancer w/ chronic scarring and volume loss of left lower lobe PE (2022) Obstructive sleep apnea-CPAP being refused by patient per RT notes  GPCs in sputum, trying to treat whatever we can noninvasively before subjecting him to a bronch so will initiate HCAP coverage.  - Lasix challenge ongoing - f/u sputum culture, vanc/zosyn, doxycycline already in place x 7 days - Hold [facility] indefinitely for now - Nebs as ordered; hold further TXA as has been on for a week without imrpovement - Prednisone taper as ordered - Progressive mobility  If still coughing up blood Monday will bronch w/ cryo to see if anything we can stop; tough case  Lorin Glass, MD [facility] Pulmonary/Critical Care Medicine 04/13/2023 11:39 AM  See Amion for personal pager For hours between 7 PM to 7 AM, please call Elink for urgent questions

## 2023-04-13 NOTE — Plan of Care (Signed)
Problem: Metabolic: Goal: Ability to maintain appropriate glucose levels will improve Outcome: Progressing   Problem: Skin Integrity: Goal: Risk for impaired skin integrity will decrease Outcome: Progressing

## 2023-04-13 NOTE — Progress Notes (Signed)
PROGRESS NOTE Mario [name]  ZOX:[id number] DOB: Jul 21, 1950 DOA: 04/01/2023 PCP: [name]  Brief Narrative/Hospital Course: 27 yom w/ COPD, chronic hypoxic respiratory failure on 5 L O2 [place], history of PE on Eliquis, history of left lung cancer (s/p radiation), T2DM, HTN, BPH, GAD admitted on 1/17-2/1 for hypoxic respiratory failure/pneumonia/RSV infection and COPD exacerbation presented in the same day of discharge with persistent dyspnea in setting of COPD exacerbation, CAP, RSV infection. In the ED chest x-ray revealed changing x-ray finding of small right effusion, chronic pleural and consolidative process in the left base.  Was hemodynamically stable.  Patient complained of ongoing shortness of breath cough and hemoptysis-Eliquis was discontinued seen by pulmonary. Due to persistent hemoptysis 2/6-CT chest with contrast done>shows stable large airspace opacity left lower lobe, lingular consistent with pneumonia or possibly fibrosis from prior radiation treatment, small loculated fluid collection seen laterally lung base, right lower lobe opacity slightly improved. Per pulmonary Limited endobronchial options given his high oxygen requirement at 5 L [place] no good endobronchial options for diffuse disease either.Patient continued to have cough along with hemoptysis-being managed with Tranexamic acid nebs. Empirically started on doxycycline, diuretics IS.   Subjective: Seen and examined Overnight afebrile BP stable, remains on 5 L nasal cannula with home setting  Has hemoptysis which is dark. But not bright  Assessment and Plan: Principal Problem:   COPD with acute bronchitis (HCC) Active Problems:   Chronic respiratory failure with hypoxia, on home O2 therapy (HCC) - 4 L/min   DM2 (diabetes mellitus, type 2) (HCC)   History of pulmonary embolus (PE)   BPH (benign prostatic hyperplasia)   OSA (obstructive sleep apnea)   Hypertension associated with diabetes (HCC)   COPD  exacerbation (HCC)   Hemoptysis   Acute exacerbation of end-stage COPD Acute on chronic hypoxic respiratory failure-on 5 L [place] at baseline CAP/RSV infection Bronchitis Recent admission 1/17- 2/1 for same Hemoptysis in the setting of infection/COPD  while on DOAC: Patient with RSV infection and CAP with subsequent COPD exacerbation and hypoxic respiratory failure acute on chronic, readmitted same day of discharge after 2 wks of hospitalization.Initial leukocytosis resolved, BNP and troponin negative x 2. Respiratory status stable on 5 L [place] and slow to recover typical of RSV Main issue currently ongoing cough and hemoptysis  CT chest with contrast done>showed stable large airspace opacity left lower lobe, lingular consistent with pneumonia or possibly fibrosis from prior radiation treatment, small loculated fluid collection seen laterally lung base, right lower lobe opacity slightly improved. Continue current treatment with Brovana, Pulmicort BID, and steroid taper- decrease dose by 10 mg every week, bronchodilators. Cont Tranexamic acid nebs tid as per PCCM Continue doxycycline x 7 days ( 2/10>>) cont Lasix  (2/11>>) Follow respiratory culture from 2/11 Still has ongoing hemoptysis but seems to be less red.  History of PE: Remote history of PE on Eliquis- but given his history of lung cancer-was maintained on Eliquis. Eliquis discontinued due to hemoptysis for now-Per pulmonary discontinue indefinitely.CT angio chest on last admission 1/17 no PE  Lung cancer: Not currently on treatment   Type 2 diabetes: With uncontrolled hyperglycemia blood sugar at time > 200 but relatively controlled while on steroids.Keep on SSI,holding metformin  Recent Labs  Lab 04/12/23 0747 04/12/23 1139 04/12/23 1625 04/12/23 2051 04/13/23 0727  GLUCAP 137* 154* 286* 174* 144*   Hypertension: Controlled on Diltiazem 180 mg daily.   BPH: Voiding well on Flomax.    GAD/insomnia: Stable continue home  bupropion, BuSpar,  Cymbalta, Trileptal, trazodone.  Resume his Flexeril  Hyponatremia: Resolved    OSA: Cont CPAP nightly.  Constipation: Having bowel movement continue laxatives MiraLAX and stool softener as ordered.  Needed lactulose enema/dolcola PR  Obesity: Patient's Body mass index is 38.41 kg/m. : Will benefit with PCP follow-up, weight loss  healthy lifestyle and outpatient sleep evaluation.   DVT prophylaxis: Place and maintain sequential compression device Start: 04/05/23 1342 Code Status:   Code Status: Full Code Family Communication: plan of care discussed with patient at bedside. Patient status is: Remains hospitalized because of severity of illness Level of care: Med-Surg   Dispo: The patient is from: home with a roommate who had Rsv infection.            Anticipated disposition: Home once hemoptysis is better  objective: Vitals last 24 hrs: Vitals:   04/12/23 1617 04/12/23 2049 04/12/23 2159 04/13/23 0541  BP:  (!) 141/85  116/82  Pulse:  93  81  Resp:  17  17  Temp:  98.2 F (36.8 C)  97.9 F (36.6 C)  TempSrc:  Oral    SpO2: 93% 91% 92% 94%  Weight:      Height:       Weight change:   Physical Examination: General exam: alert awake, morbidly obese.  Pleasant.  HEENT:Oral mucosa moist, Ear/Nose WNL grossly Respiratory system: Bilaterally diminished, no wheezing, no use of accessory muscle Cardiovascular system: S1 & S2 +, No JVD. Gastrointestinal system: Abdomen soft,NT,ND, BS+ Nervous System: Alert, awake, moving all extremities,and following commands. Extremities: LE edema neg,distal peripheral pulses palpable and warm.  Skin: No rashes,no icterus. MSK: Normal muscle bulk,tone, power   Medications reviewed:  Scheduled Meds:  arformoterol  15 mcg Nebulization BID   ascorbic acid  500 mg Oral Daily   aspirin EC  81 mg Oral Daily   azelastine  2 spray Each Nare BID   budesonide (PULMICORT) nebulizer solution  0.25 mg Nebulization BID    buPROPion  300 mg Oral q morning   busPIRone  15 mg Oral Daily   cholecalciferol  1,000 Units Oral Daily   diltiazem  180 mg Oral Daily   docusate sodium  200 mg Oral BID   doxycycline  100 mg Oral Q12H   DULoxetine  60 mg Oral BID   fluticasone  2 spray Each Nare Daily   folic acid  1 mg Oral Daily   furosemide  40 mg Intravenous BID   gabapentin  100 mg Oral TID   guaiFENesin  1,200 mg Oral BID   insulin aspart  0-5 Units Subcutaneous QHS   insulin aspart  0-9 Units Subcutaneous TID WC   magnesium oxide  200 mg Oral Daily   melatonin  3 mg Oral QHS   multivitamin with minerals  1 tablet Oral Daily   Oxcarbazepine  600 mg Oral TID   pantoprazole  40 mg Oral BID   polyethylene glycol  17 g Oral TID   potassium chloride  40 mEq Oral Daily   predniSONE  30 mg Oral Q breakfast   revefenacin  175 mcg Nebulization Daily   sodium chloride  2 spray Each Nare BID   tamsulosin  0.8 mg Oral Daily   tranexamic acid  500 mg Nebulization Q8H   traZODone  75 mg Oral QHS   Continuous Infusions:   Diet Order             Diet regular Fluid consistency: Thin  Diet effective now  Intake/Output Summary (Last 24 hours) at 04/13/2023 0806 Last data filed at 04/13/2023 0600 Gross per 24 hour  Intake 1330 ml  Output 3000 ml  Net -1670 ml   Net IO Since Admission: -10,300 mL [04/13/23 0806]  Wt Readings from Last 3 Encounters:  04/05/23 135.7 kg  03/17/23 (!) 141.3 kg  01/26/23 127 kg     Unresulted Labs (From admission, onward)    None     Data Reviewed: I have personally reviewed following labs and imaging studies CBC: Recent Labs  Lab 04/07/23 0501 04/13/23 0506  WBC 10.1 12.1*  HGB 12.8* 13.6  HCT 39.2 42.7  MCV 92.0 94.3  PLT 263 306   Basic Metabolic Panel:  Recent Labs  Lab 04/07/23 0501 04/08/23 0537 04/13/23 0506  NA 131* 131* 137  K 3.7 3.7 3.7  CL 96* 95* 97*  CO2 27 29 29   GLUCOSE 157* 156* 145*  BUN 15 16 23   CREATININE 0.76 0.76  0.82  CALCIUM 8.4* 8.5* 8.9   GFR: Estimated Creatinine Clearance: 121.1 mL/min (by C-G formula based on SCr of 0.82 mg/dL). Liver Function Tests:  No results for input(s): "AST", "ALT", "ALKPHOS", "BILITOT", "PROT", "ALBUMIN" in the last 168 hours.  No results for input(s): "HGBA1C" in the last 72 hours. Recent Labs  Lab 04/12/23 0747 04/12/23 1139 04/12/23 1625 04/12/23 2051 04/13/23 0727  GLUCAP 137* 154* 286* 174* 144*   No results for input(s): "CHOL", "HDL", "LDLCALC", "TRIG", "CHOLHDL", "LDLDIRECT" in the last 72 hours. No results for input(s): "TSH", "T4TOTAL", "FREET4", "T3FREE", "THYROIDAB" in the last 72 hours. Sepsis Labs: Recent Labs  Lab 04/06/23 1810  PROCALCITON <0.10   Recent Results (from the past 240 hours)  Expectorated Sputum Assessment w Gram Stain, Rflx to Resp Cult     Status: None   Collection Time: 04/12/23  3:47 AM   Specimen: Expectorated Sputum  Result Value Ref Range Status   Specimen Description EXPECTORATED SPUTUM  Final   Special Requests NONE  Final   Sputum evaluation   Final    THIS SPECIMEN IS ACCEPTABLE FOR SPUTUM CULTURE Performed at [facility], [address] [place], [place] [address]    Report Status 04/12/2023 FINAL  Final  Culture, Respiratory w Gram Stain     Status: None (Preliminary result)   Collection Time: 04/12/23  3:47 AM  Result Value Ref Range Status   Specimen Description   Final    EXPECTORATED SPUTUM Performed at [facility], [address], [place], [place] [address]    Special Requests   Final    NONE Reflexed from [id number] Performed at [facility], [address], [place], [place] [address]    Gram Stain   Final    NO WBC SEEN RARE GRAM POSITIVE COCCI Performed at [facility], [address] [place], [place] [address]    Culture PENDING  Incomplete   Report Status PENDING  Incomplete     Antimicrobials/Microbiology: Anti-infectives  (From admission, onward)    Start     Dose/Rate Route Frequency Ordered Stop   04/10/23 1000  doxycycline (VIBRA-TABS) tablet 100 mg        100 mg Oral Every 12 hours 04/10/23 0728           Component Value Date/Time   SDES EXPECTORATED SPUTUM 04/12/2023 0347   SDES  04/12/2023 0347    EXPECTORATED SPUTUM Performed at [facility], [address] [place], [place]  [address]    SPECREQUEST NONE 04/12/2023 0347   SPECREQUEST  04/12/2023 0347    NONE Reflexed from [id number] Performed at [facility], [address] [place], [place] [address]    CULT PENDING 04/12/2023 0347   REPTSTATUS 04/12/2023 FINAL 04/12/2023 0347   REPTSTATUS PENDING 04/12/2023 0347     Radiology Studies: DG Chest 1 View Result Date: 04/12/2023 CLINICAL DATA:  Hemoptysis EXAM: PORTABLE CHEST 1 VIEW COMPARISON:  04/01/2023 FINDINGS: Cardiac shadow is stable. Persistent bibasilar airspace opacities are noted left greater than right with associated small effusions. The overall appearance is stable from the prior study. No pneumothorax is seen. IMPRESSION: Stable appearance when compared with the previous exam. Electronically Signed   By: Alcide Clever M.D.   On: 04/12/2023 10:07       LOS: 9 days   Total time spent in review of labs and imaging, patient evaluation, formulation of plan, documentation and communication with family:   Lanae Boast, MD  [facility]  04/13/2023, 8:06 AM

## 2023-04-13 NOTE — Progress Notes (Signed)
Patient coughed up a nickel and dime sized sputum, brown in color, frothy, and thick. PRN medication given. Plan of care ongoing.

## 2023-04-13 NOTE — Progress Notes (Signed)
Pharmacy Antibiotic Note  Mario [name] is a 73 y.o. male admitted on 04/01/2023 with persistent dyspnea. He was admitted 1/17 to 2/1 and presented back on same day of discharge. He has been having hemoptysis, anticoagulation held. He was started on nebulizers and an empiric course of doxycycline. Pharmacy has been consulted for vancomycin and Zosyn dosing.  Plan: -Vancomycin 2750 mg IV x 1 followed by 1250 mg IV q12h -Zosyn 3.375 g IV q8h extended infusion -Continue to follow renal function, cultures and clinical progress for dose adjustments and de-escalation as indicated  Height: 6\' 2"  (188 cm) Weight: 135.7 kg (299 lb 2.6 oz) IBW/kg (Calculated) : 82.2  Temp (24hrs), Avg:98.2 F (36.8 C), Min:97.9 F (36.6 C), Max:98.5 F (36.9 C)  Recent Labs  Lab 04/07/23 0501 04/08/23 0537 04/13/23 0506  WBC 10.1  --  12.1*  CREATININE 0.76 0.76 0.82    Estimated Creatinine Clearance: 121.1 mL/min (by C-G formula based on SCr of 0.82 mg/dL).    Allergies  Allergen Reactions   Demerol [Meperidine] Nausea And Vomiting and Other (See Comments)    Made the patient "violently sick"   Zocor [Simvastatin] Nausea And Vomiting and Other (See Comments)    Made him very jittery, also   Beet [Beta Vulgaris] Nausea And Vomiting   Liver Nausea And Vomiting    Antimicrobials this admission: Zosyn 2/13 >> Vancomycin 2/13 >> Doxycycline 2/10 >>  Dose adjustments this admission: NA  Microbiology results: 2/12 Sputum: pending    Thank you for allowing pharmacy to be a part of this patient's care.  Pricilla Riffle, PharmD, BCPS Clinical Pharmacist 04/13/2023 11:21 AM

## 2023-04-13 NOTE — Plan of Care (Signed)
Problem: Fluid Volume: Goal: Ability to maintain a balanced intake and output will improve Outcome: Progressing   Problem: Metabolic: Goal: Ability to maintain appropriate glucose levels will improve Outcome: Progressing   Problem: Nutritional: Goal: Maintenance of adequate nutrition will improve Outcome: Progressing

## 2023-04-14 LAB — BASIC METABOLIC PANEL
Anion gap: 11 (ref 5–15)
BUN: 25 mg/dL — ABNORMAL HIGH (ref 8–23)
CO2: 25 mmol/L (ref 22–32)
Calcium: 8.4 mg/dL — ABNORMAL LOW (ref 8.9–10.3)
Chloride: 96 mmol/L — ABNORMAL LOW (ref 98–111)
Creatinine, Ser: 0.96 mg/dL (ref 0.61–1.24)
GFR, Estimated: >60 mL/min (ref >60)
Glucose, Bld: 145 mg/dL — ABNORMAL HIGH (ref 70–99)
Potassium: 3.8 mmol/L (ref 3.5–5.1)
Sodium: 132 mmol/L — ABNORMAL LOW (ref 135–145)

## 2023-04-14 LAB — CULTURE, RESPIRATORY W GRAM STAIN: Gram Stain: NONE SEEN

## 2023-04-14 LAB — GLUCOSE, CAPILLARY
Glucose-Capillary: 138 mg/dL — ABNORMAL HIGH (ref 70–99)
Glucose-Capillary: 242 mg/dL — ABNORMAL HIGH (ref 70–99)
Glucose-Capillary: 347 mg/dL — ABNORMAL HIGH (ref 70–99)
Glucose-Capillary: 193 mg/dL — ABNORMAL HIGH (ref 70–99)

## 2023-04-14 LAB — MAGNESIUM: Magnesium: 2.3 mg/dL (ref 1.7–2.4)

## 2023-04-14 MED ORDER — INSULIN ASPART 100 UNIT/ML IJ SOLN
3.0000 [IU] | Freq: Three times a day (TID) | INTRAMUSCULAR | Status: DC
  Administered 2023-04-14 – 2023-04-24 (×25): 3 [IU] via SUBCUTANEOUS

## 2023-04-14 NOTE — Progress Notes (Signed)
Physical Therapy Treatment Patient Details Name: Mario [name] MRN: [id number] DOB: Jan 31, 1951 Today's Date: 04/14/2023   History of Present Illness Patient is a 73 year old male presents to the ED for evaluation of shortness of breath. pt was discharged earlier same day after being admitted initially on 03/17/2023 for acute on chronic hypoxic respiratory failure in the setting of community-acquired pneumonia, RSV infection, and COPD exacerbation.    medical history significant for COPD, chronic hypoxic respiratory failure on 5 L O2 [place], history of PE on Eliquis, history of left lung cancer (s/p radiation), T2DM, HTN, BPH, GAD    PT Comments   Pt admitted with above diagnosis.  Pt currently with functional limitations due to the deficits listed below (see PT Problem List). Pt semi reclined and in L side lying when PT arrived. Pt on 5 L/min and 97% at rest, pt reported no SOB at rest, HA and neck pain. Pt is mod I for bed mobility, S for transfer tasks and gait tasks no AD, pt placed on 6 L/min for gait tasks and desaturated to 88% requiring standing therapeutic rest break with cues for pursed lip breathing with amb bouts 50 feet x 2. Pt returned to room and left seated EOB and all needs in place on 5 L/min and 96%. Pt will benefit from acute skilled PT to increase their independence and safety with mobility to allow discharge.      If plan is discharge home, recommend the following: Help with stairs or ramp for entrance;Assistance with cooking/housework;Assist for transportation   Can travel by private vehicle        Equipment Recommendations  None recommended by PT    Recommendations for Other Services       Precautions / Restrictions Precautions Precautions: Fall Precaution/Restrictions Comments: monitor O2 and HR/5L at baseline Restrictions Weight Bearing Restrictions Per Provider Order: No     Mobility  Bed Mobility Overal bed mobility: Modified Independent              General bed mobility comments: self able with increased time    Transfers Overall transfer level: Modified independent Equipment used: None Transfers: Sit to/from Stand Sit to Stand: Supervision           General transfer comment: min cues    Ambulation/Gait Ambulation/Gait assistance: Supervision Gait Distance (Feet): 50 Feet (x 2) Assistive device: None Gait Pattern/deviations: Decreased stride length, Wide base of support Gait velocity: decreased     General Gait Details: min cues for safety and pursed lip breathing, pt on 6 L/min and desaturated to 88% requiring standing theraputic rest break to recover to 90% prior to returning to room 50 feet x 2, pt 90% once returned to room, pt had frequent but unproductive cough   Stairs             Wheelchair Mobility     Tilt Bed    Modified Rankin (Stroke Patients Only)       Balance Overall balance assessment: No apparent balance deficits (not formally assessed)                                          Communication Communication Communication: No apparent difficulties  Cognition Arousal: Alert Behavior During Therapy: [facility] for tasks assessed/performed  PT - Cognition Comments: AxO x 3 pleasant.  Lives with a Roommate who got sick first "RSV"        Cueing    Exercises      General Comments General comments (skin integrity, edema, etc.): 6 L/min with mobility      Pertinent Vitals/Pain Pain Assessment Pain Assessment: Faces Faces Pain Scale: Hurts little more Pain Location: HA and neck Pain Descriptors / Indicators: Headache, Aching, Discomfort, Dull Pain Intervention(s): Limited activity within patient's tolerance, Monitored during session, Repositioned, Patient requesting pain meds-RN notified    Home Living                          Prior Function            PT Goals (current goals can now be found in the care plan  section) Acute Rehab PT Goals PT Goal Formulation: With patient Time For Goal Achievement: 05/08/23 Potential to Achieve Goals: Good Progress towards PT goals: Progressing toward goals    Frequency    Min 1X/week      PT Plan      Co-evaluation              AM-PAC PT "6 Clicks" Mobility   Outcome Measure  Help needed turning from your back to your side while in a flat bed without using bedrails?: None Help needed moving from lying on your back to sitting on the side of a flat bed without using bedrails?: None Help needed moving to and from a bed to a chair (including a wheelchair)?: None Help needed standing up from a chair using your arms (e.g., wheelchair or bedside chair)?: A Little Help needed to walk in hospital room?: A Little Help needed climbing 3-5 steps with a railing? : A Little 6 Click Score: 21    End of Session Equipment Utilized During Treatment: Gait belt;Oxygen Activity Tolerance: Patient limited by fatigue Patient left: in bed;with call bell/phone within reach Nurse Communication: Mobility status;Patient requests pain meds PT Visit Diagnosis: Other abnormalities of gait and mobility (R26.89)     Time: 6213-0865 PT Time Calculation (min) (ACUTE ONLY): 23 min  Charges:    $Gait Training: 8-22 mins $Therapeutic Activity: 8-22 mins PT General Charges $$ ACUTE PT VISIT: 1 Visit                     Johnny Bridge, PT Acute Rehab    Jacqualyn Posey 04/14/2023, 12:22 PM

## 2023-04-14 NOTE — Progress Notes (Signed)
[facility] PROGRESS NOTE    Progress Note  Mario [name]  UJW:[id number] DOB: [birth year] DOA: 04/01/2023 PCP: [name]     Brief Narrative:   Mario [name] is an 73 y.o. 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  Came back to the ED with a white count of 13 BNP of 21 chest x-ray showed right basilar infiltrate with persistent airspace disease.  Due to ongoing shortness of breath hemoptysis a CT of the chest was done that showed large left lower lobe infiltrate some pulmonary fibrosis mild loculated fluid collection pulmonary and critical care was consulted and related that the limited endobronchial due to his oxygen requirement.  He was continued on empiric antibiotics and diuretics.  He continues to have hemoptysis despite 1 week of anticoagulation interruption.   Assessment/Plan:   Acute respiratory failure with hypoxia in a patient with COPD on 5 L of oxygen dependent with a recent pneumonia and RSV infection readmitted and found to have hemoptysis while on DOAC: Has ongoing hemoptysis despite holding DOAC's. Currently requiring 5 L of oxygen to keep saturations greater than 90%. Due to hemoptysis and ongoing shortness of breath CT angio of the chest was done that showed left lower lobe infiltrates pulmonary fibrosis small loculated effusion. He was continue on inhalers and steroid taper. Pulmonary and critical care was consulted who agreed to continue antibiotics and a Lasix challenge. Procalcitonin is low yield. Due to his ongoing hemoptysis, he was placed on tranexamic acid nebs for 1 week. Will need to continue antibiotics for 7 days. Continue steroid taper. Holding anticoagulation  indefinitely for now. Continue incentive spirometry and flutter valve out of bed to chair. For therapy evaluated the patient no home health PT. Still coughing on Monday will need bronchoscopy. Was given a Lasix challenge, creatinine is slowly rising we will hold.  History of PE: Was on Eliquis prior to admission discontinued due to hemoptysis. Will need to be off Eliquis indefinitely. CT angio of the chest on 03/17/2023 was negative for PE.  History of lung cancer: Status post surgery.  Diabetes mellitus type 2: He is on low-dose steroids which will make his blood glucose erratic, continue sliding scale insulin, add meal coverage.  Essential hypertension: Continue diltiazem.  BPH: Continue Flomax.  General anxiety disorder: Continue bupropion, BuSpar, Cymbalta, Trileptal and trazodone.  Hypovolemic hyponatremia: Low today likely due to diuretic challenge According to chart he is negative about 11 L. His weight is only down by 1 kg.  Sepsis sleep apnea: Continue CPAP at night.  Constipation: Continue MiraLAX.  Morbid obesity: Noted.   DVT prophylaxis: lovenox Family Communication:none Status is: Inpatient Remains inpatient appropriate because: Hemoptysis    Code Status:     Code Status Orders  (From admission, onward)           Start     Ordered   04/01/23 2031  Full code  Continuous       Question:  By:  Answer:  Consent: discussion documented in EHR   04/01/23 2032           Code Status History     Date Active Date Inactive Code Status Order ID Comments User Context   03/17/2023 0419 04/01/2023  1712 Full Code [id number]  Tereasa Coop, MD ED   10/20/2022 0511 10/21/2022 2108 Full Code [id number]  Gery Pray, MD ED   10/08/2022 0430 10/09/2022 1857 Full Code [id number]  Glynn Octave, MD ED   09/25/2022 1202 10/01/2022 1702 Full Code [id number]  Bobette Mo, MD ED   07/02/2022 1956 07/05/2022 1810 Full Code [id number]  Orland Mustard, MD ED    11/03/2021 0057 11/04/2021 1755 Full Code [id number]  Briscoe Deutscher, MD ED   10/08/2021 1333 10/09/2021 1719 Full Code [id number]  Al Decant, PA-C ED   10/01/2021 1932 10/04/2021 1934 Full Code [id number]  Franne Forts, DO ED   09/30/2021 2345 10/01/2021 1822 Full Code [id number]  Nira Conn, MD ED   09/23/2021 0029 09/26/2021 2237 DNR [id number]  Marinda Elk, MD ED   08/25/2021 1020 09/07/2021 1653 DNR [id number]  Joylene Draft, NP Inpatient   08/24/2021 0402 08/25/2021 1020 Full Code [id number]  Briscoe Deutscher, MD ED   08/21/2021 0031 08/23/2021 1845 Full Code [id number]  Briscoe Deutscher, MD ED   08/07/2021 0956 08/11/2021 0427 Full Code [id number]  Drema Dallas, MD ED   08/03/2021 1851 08/06/2021 0348 Full Code [id number]  Joseph Art, DO Inpatient   06/17/2021 0046 07/06/2021 1726 Full Code [id number]  Benita Gutter T, DO ED   06/04/2021 0139 06/11/2021 1609 Full Code [id number]  Howerter, Chaney Born, DO ED   05/21/2021 2320 05/25/2021 0549 Full Code [id number]  Rehman, Areeg N, DO ED   05/20/2021 0150 05/21/2021 2025 Full Code [id number]  Rehman, Areeg N, DO ED   02/04/2021 0105 02/04/2021 2154 Full Code [id number]  Charlsie Quest, MD ED   01/09/2021 0612 01/10/2021 2012 Full Code [id number]  John Giovanni, MD Inpatient   09/04/2020 0923 09/05/2020 1724 Full Code [id number]  Teddy Spike, DO ED   07/26/2020 1449 08/05/2020 1913 Full Code [id number]  Teddy Spike, DO Inpatient   04/04/2020 0033 04/07/2020 2141 Full Code [id number]  Eduard Clos, MD ED   01/04/2020 1615 01/06/2020 1904 Full Code [id number]  Teddy Spike, DO Inpatient   12/26/2019 1039 12/28/2019 1810 Full Code [id number]  Jonah Blue, MD ED   07/12/2019 0557 07/15/2019 1904 Full Code [id number]  Marily Memos, MD ED   07/03/2019 0016 07/03/2019 2011 Full Code [id number]  Bobette Mo, MD ED   07/03/2019 0016 07/03/2019 0016 Full Code [id number]  Bobette Mo, MD ED   06/23/2019 0425 06/25/2019 2122 Full Code [id number]  Thalia Party, DO ED   10/10/2018 2021 10/13/2018 1641 Full Code [id number]  Ward, Chase Picket, PA-C ED   04/30/2018 1759 05/01/2018 1714 Full Code [id number]  Dietrich Pates, PA-C ED   02/20/2018 1759 02/21/2018 1452 Full Code [id number]  Sabas Sous, MD ED   07/16/2017 0309 07/21/2017 1734 Full Code [id number]  Hillary Bow, DO ED   09/17/2016 2107 09/18/2016 2055 Full Code [id number]  Freddrick March, MD ED   06/27/2016 1523 07/07/2016 1718 Full Code [id number]  Ardelle Balls, PA-C ED   05/23/2014 2356 05/26/2014 1656 Full Code [id number]  Pearson Grippe, MD Inpatient   01/21/2013 2202 01/23/2013 2202 Full Code [id number]  Angus Seller PA-C ED   01/12/2013 0020 01/12/2013 0641 Full Code [id number]  Arthor Captain, PA-C ED   01/10/2013 0122 01/11/2013 2122 Full Code [id number]  Eduard Clos, MD Inpatient         IV Access:   Peripheral IV  Procedures and diagnostic studies:   No results found.   Medical Consultants:   None.   Subjective:    ALONSO GAPINSKI relates his hemoptysis is unchanged.  Objective:    Vitals:   04/13/23 1942 04/13/23 2123 04/14/23 0526 04/14/23 0639  BP:  (!) 140/70  131/72  Pulse:  88  70  Resp:  18  18  Temp:  98 F (36.7 C)  98 F (36.7 C)  TempSrc:  Oral  Oral  SpO2: 94% 92%  98%  Weight:   134.7 kg   Height:       SpO2: 98 % O2 Flow Rate (L/min): 5 L/min FiO2 (%): 40 %   Intake/Output Summary (Last 24 hours) at 04/14/2023 0718 Last data filed at 04/14/2023 0519 Gross per 24 hour  Intake 2295.69 ml  Output 3300 ml  Net -1004.31 ml   Filed Weights   04/04/23 0610 04/05/23 0500 04/14/23 0526  Weight: 134.5 kg 135.7 kg 134.7 kg    Exam: General exam: In no acute distress. Respiratory system: Good air movement and clear to auscultation. Cardiovascular system: S1 & S2 heard, RRR. No JVD. Gastrointestinal system: Abdomen is nondistended, soft and nontender.  Extremities: No pedal edema. Skin: No rashes, lesions or  ulcers Psychiatry: Judgement and insight appear normal. Mood & affect appropriate.    Data Reviewed:    Labs: Basic Metabolic Panel: Recent Labs  Lab 04/08/23 0537 04/13/23 0506  NA 131* 137  K 3.7 3.7  CL 95* 97*  CO2 29 29  GLUCOSE 156* 145*  BUN 16 23  CREATININE 0.76 0.82  CALCIUM 8.5* 8.9   GFR Estimated Creatinine Clearance: 120.6 mL/min (by C-G formula based on SCr of 0.82 mg/dL). Liver Function Tests: No results for input(s): "AST", "ALT", "ALKPHOS", "BILITOT", "PROT", "ALBUMIN" in the last 168 hours. No results for input(s): "LIPASE", "AMYLASE" in the last 168 hours. No results for input(s): "AMMONIA" in the last 168 hours. Coagulation profile No results for input(s): "INR", "PROTIME" in the last 168 hours. COVID-19 Labs  No results for input(s): "DDIMER", "FERRITIN", "LDH", "CRP" in the last 72 hours.  Lab Results  Component Value Date   SARSCOV2NAA NEGATIVE 03/17/2023   SARSCOV2NAA NEGATIVE 10/29/2022   SARSCOV2NAA NEGATIVE 10/19/2022   SARSCOV2NAA POSITIVE (A) 09/25/2022    CBC: Recent Labs  Lab 04/13/23 0506  WBC 12.1*  HGB 13.6  HCT 42.7  MCV 94.3  PLT 306   Cardiac Enzymes: No results for input(s): "CKTOTAL", "CKMB", "CKMBINDEX", "TROPONINI" in the last 168 hours. BNP (last 3 results) No results for input(s): "PROBNP" in the last 8760 hours. CBG: Recent Labs  Lab 04/12/23 2051 04/13/23 0727 04/13/23 1117 04/13/23 1645 04/13/23 2118  GLUCAP 174* 144* 158* 279* 178*   D-Dimer: No results for input(s): "DDIMER" in the last 72 hours. Hgb A1c: No results for input(s): "HGBA1C" in the last 72 hours. Lipid Profile: No results for input(s): "CHOL", "HDL", "LDLCALC", "TRIG", "CHOLHDL", "LDLDIRECT" in the last 72 hours. Thyroid function studies: No results for input(s): "TSH", "T4TOTAL", "T3FREE", "THYROIDAB" in the last 72 hours.  Invalid input(s): "FREET3" Anemia work up: No results for input(s): "VITAMINB12", "FOLATE", "FERRITIN",  "TIBC", "IRON", "RETICCTPCT" in the last 72 hours. Sepsis Labs: Recent Labs  Lab 04/13/23 0506  WBC 12.1*   Microbiology Recent Results (from the past 240 hours)  Expectorated Sputum Assessment w Gram Stain, Rflx to Resp Cult     Status: None   Collection Time: 04/12/23  3:47 AM   Specimen:  Expectorated Sputum  Result Value Ref Range Status   Specimen Description EXPECTORATED SPUTUM  Final   Special Requests NONE  Final   Sputum evaluation   Final    THIS SPECIMEN IS ACCEPTABLE FOR SPUTUM CULTURE Performed at [facility], [address], [place], [place] [address]    Report Status 04/12/2023 FINAL  Final  Culture, Respiratory w Gram Stain     Status: None (Preliminary result)   Collection Time: 04/12/23  3:47 AM  Result Value Ref Range Status   Specimen Description   Final    EXPECTORATED SPUTUM Performed at [facility], [address], [place], [place] [address]    Special Requests   Final    NONE Reflexed from [id number] Performed at [facility], [address] [place], [place] [address]    Gram Stain NO WBC SEEN RARE GRAM POSITIVE COCCI   Final   Culture   Final    CULTURE REINCUBATED FOR BETTER GROWTH Performed at [facility], [address], [place], [place] [address]    Report Status PENDING  Incomplete     Medications:    arformoterol  15 mcg Nebulization BID   ascorbic acid  500 mg Oral Daily   aspirin EC  81 mg Oral Daily   azelastine  2 spray Each Nare BID   budesonide (PULMICORT) nebulizer solution  0.25 mg Nebulization BID   buPROPion  300 mg Oral q morning   busPIRone  15 mg Oral Daily   cholecalciferol  1,000 Units Oral Daily   diltiazem  180 mg Oral Daily   docusate sodium  200 mg Oral BID   doxycycline  100 mg Oral Q12H   DULoxetine  60 mg Oral BID   fluticasone  2 spray Each Nare Daily   folic acid  1 mg Oral Daily   furosemide  40 mg Intravenous BID   gabapentin  100 mg Oral  TID   guaiFENesin  1,200 mg Oral BID   insulin aspart  0-5 Units Subcutaneous QHS   insulin aspart  0-9 Units Subcutaneous TID WC   magnesium oxide  200 mg Oral Daily   melatonin  3 mg Oral QHS   multivitamin with minerals  1 tablet Oral Daily   Oxcarbazepine  600 mg Oral TID   pantoprazole  40 mg Oral BID   polyethylene glycol  17 g Oral TID   predniSONE  30 mg Oral Q breakfast   revefenacin  175 mcg Nebulization Daily   sodium chloride  2 spray Each Nare BID   tamsulosin  0.8 mg Oral Daily   traZODone  75 mg Oral QHS   Continuous Infusions:  piperacillin-tazobactam (ZOSYN)  IV 3.375 g (04/14/23 0519)   vancomycin 1,250 mg (04/14/23 0252)      LOS: 10 days   Marinda Elk  [facility]  04/14/2023, 7:18 AM

## 2023-04-15 DIAGNOSIS — J449 Chronic obstructive pulmonary disease, unspecified: Secondary | ICD-10-CM

## 2023-04-15 DIAGNOSIS — J9601 Acute respiratory failure with hypoxia: Secondary | ICD-10-CM

## 2023-04-15 DIAGNOSIS — G4733 Obstructive sleep apnea (adult) (pediatric): Secondary | ICD-10-CM

## 2023-04-15 LAB — MAGNESIUM: Magnesium: 2.1 mg/dL (ref 1.7–2.4)

## 2023-04-15 LAB — BASIC METABOLIC PANEL
Anion gap: 8 (ref 5–15)
BUN: 19 mg/dL (ref 8–23)
CO2: 25 mmol/L (ref 22–32)
Calcium: 8.4 mg/dL — ABNORMAL LOW (ref 8.9–10.3)
Chloride: 101 mmol/L (ref 98–111)
Creatinine, Ser: 0.86 mg/dL (ref 0.61–1.24)
GFR, Estimated: >60 mL/min (ref >60)
Glucose, Bld: 180 mg/dL — ABNORMAL HIGH (ref 70–99)
Potassium: 4.8 mmol/L (ref 3.5–5.1)
Sodium: 134 mmol/L — ABNORMAL LOW (ref 135–145)

## 2023-04-15 LAB — GLUCOSE, CAPILLARY
Glucose-Capillary: 163 mg/dL — ABNORMAL HIGH (ref 70–99)
Glucose-Capillary: 169 mg/dL — ABNORMAL HIGH (ref 70–99)
Glucose-Capillary: 156 mg/dL — ABNORMAL HIGH (ref 70–99)
Glucose-Capillary: 176 mg/dL — ABNORMAL HIGH (ref 70–99)

## 2023-04-15 MED ORDER — INSULIN GLARGINE-YFGN 100 UNIT/ML ~~LOC~~ SOLN
5.0000 [IU] | Freq: Two times a day (BID) | SUBCUTANEOUS | Status: DC
  Administered 2023-04-15 – 2023-04-24 (×19): 5 [IU] via SUBCUTANEOUS
  Filled 2023-04-15 (×21): qty 0.05

## 2023-04-15 MED ORDER — PREDNISONE 20 MG PO TABS
20.0000 mg | ORAL_TABLET | Freq: Every day | ORAL | Status: DC
  Administered 2023-04-15 – 2023-04-16 (×2): 20 mg via ORAL
  Filled 2023-04-15 (×2): qty 1

## 2023-04-15 NOTE — Progress Notes (Signed)
Mobility Specialist - Progress Note   04/15/23 0919  Oxygen Therapy  SpO2 (!) 88 %  O2 Device Nasal Cannula  O2 Flow Rate (L/min) 4 L/min  Patient Activity (if Appropriate) Ambulating  Mobility  Activity Ambulated independently in hallway  Level of Assistance Independent  Assistive Device None  Distance Ambulated (ft) 80 ft  Activity Response Tolerated well  Mobility Referral Yes  Mobility visit 1 Mobility  Mobility Specialist Start Time (ACUTE ONLY) 0907  Mobility Specialist Stop Time (ACUTE ONLY) 0919  Mobility Specialist Time Calculation (min) (ACUTE ONLY) 12 min   Pt received in bed and agreeable to mobility. Upon returning to room, pt desat to 88%. Encouraged pursed lip breaths bringing SpO2 to 95% (~70min). No complaints during session. Pt to EOB after session with all needs met.    Pre-mobility: 86 HR, 93%  SpO2 During mobility: 100 HR, 88-95% SpO2 Post-mobility: 91 HR, 95% SPO2  Chief Technology Officer

## 2023-04-15 NOTE — Progress Notes (Signed)
Pt was unavailable for chest vest this am, eating first check and sleeping the next check. No respiratory distress noted.

## 2023-04-15 NOTE — Progress Notes (Signed)
   NAME:  Mario [name], MRN:  [id number], DOB:  [birth year], LOS: 11 ADMISSION DATE:  04/01/2023, CONSULTATION DATE:  2/3 REFERRING MD:  KC, CHIEF COMPLAINT:  hemoptysis    History of Present Illness:  73 year old male w/ chronic resp failure in setting of COPD (5lpm baseline) remotely seen by [facility] (not seen in office > 2 yrs). Admitted from 1/17 to 2/1 w/ what AECOPD in setting of RSV infection and prob CAP after treated w/ ABX and systemic steroids.. was sent home on 5 liters rest and instructed to increase to 6 lpm w/ activity. Apparently when home rested then got up to move, walked up flight of stairs, sats 80s, marked dyspnea, HR 130s. Reported back to the ER that evening (same day of discharge)  In ER  Wbc 12.4, BNP 29/ CXR w/ right basilar band-like atx, and persistent left sided airspace disease.  Continued on oxygen, scheduled BDs ordered, got IV steroids followed by orals.  On 2/1 started having episodes of hemoptysis.  This has been fairly persistent.  It has been bright red, about a teaspoon in size happening fairly frequently at least once every 2 hours and has persisted over night into 2/3 he also continues to have resting dyspnea and thus PCCM asked to eval  Pertinent  Medical History  Chronic resp failure 5 lpm baseline, remote PE (on DOAC), lung cancer s/p XRT, type II DM, HTN, BPH, GAD.grade I diastolic dysfxn ef 60-65% Significant Hospital Events: Including procedures, antibiotic start and stop dates in addition to other pertinent events   2/1 discharged and readmitted following treatment for RSV infection possible And AECOPD placed back on steroids and nebulizers 2/3 pulm consulted for shortness of breath and hemoptysis 2/15  Interim History / Subjective:  Still with some hemoptysis Sometimes brown sometimes bright red Objective   Blood pressure 117/60, pulse 70, temperature (!) 97.5 F (36.4 C), temperature source Oral, resp. rate 15, height 6\' 2"  (1.88 m), weight 135.2  kg, SpO2 95%.        Intake/Output Summary (Last 24 hours) at 04/15/2023 0755 Last data filed at 04/15/2023 0600 Gross per 24 hour  Intake 1120 ml  Output 1650 ml  Net -530 ml   Filed Weights   04/05/23 0500 04/14/23 0526 04/15/23 0500  Weight: 135.7 kg 134.7 kg 135.2 kg   Physical Exam: In no apparent distress Moist oral mucosa Clear but decreased breath sounds S1-S2 appreciated Bowel sounds appreciated Alert and oriented x 3  Personally reviewed CT scan of the chest -Multilobar infiltrate left lower lobe  Resolved Hospital Problem list     Assessment & Plan:   Hemoptysis persists -Amount is still minimal but consistent -Has been on antibiotics, steroids, off anticoagulation -Currently on doxycycline Zosyn and vancomycin-day 3 -On 20 prednisone  Rare Gram positive cocci on sputum-no specific organism  Chronic hypoxemic respiratory failure secondary to COPD Chronic left lower lobe volume loss -Recent RSV, community-acquired pneumonia  Pulmonary embolism in 2022  Obstructive sleep apnea -Has not been using CPAP  Tentative plans will be bronchoscopy Monday plus or minus cryo if site of bleeding identifiable  I will check on him again tomorrow to see if he still actively bringing up some blood  Virl Diamond, MD [facility] Pager: See Loretha Stapler

## 2023-04-15 NOTE — Plan of Care (Signed)
   Problem: Fluid Volume: Goal: Ability to maintain a balanced intake and output will improve Outcome: Progressing   Problem: Metabolic: Goal: Ability to maintain appropriate glucose levels will improve Outcome: Progressing   Problem: Nutritional: Goal: Maintenance of adequate nutrition will improve Outcome: Progressing

## 2023-04-15 NOTE — Progress Notes (Signed)
[facility] PROGRESS NOTE    Progress Note  Mario [name]  MVH:[id number] DOB: October 06, 1950 DOA: 04/01/2023 PCP: [name]     Brief Narrative:   Mario [name] is an 73 y.o. 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  Came back to the ED with a white count of 13 BNP of 21 chest x-ray showed right basilar infiltrate with persistent airspace disease.  Due to ongoing shortness of breath hemoptysis a CT of the chest was done that showed large left lower lobe infiltrate some pulmonary fibrosis mild loculated fluid collection pulmonary and critical care was consulted and related that the limited endobronchial due to his oxygen requirement.  He was continued on empiric antibiotics and diuretics.  He continues to have hemoptysis despite 1 week of anticoagulation interruption.   Assessment/Plan:   Acute respiratory failure with hypoxia in a patient with COPD on 5 L of oxygen dependent with a recent pneumonia and RSV infection readmitted and found to have hemoptysis while on DOAC: Has ongoing hemoptysis despite holding DOAC's. Holding anticoagulation indefinitely for now. This is this morning is brown color Currently requiring 4-5 L of oxygen to keep saturations greater than 90%. He was continue on inhalers and steroid taper. Will need complete antibiotics for 7 days. Pulmonary and critical care was consulted who agreed to continue antibiotics and a Lasix challenge. Due to his ongoing hemoptysis, he was placed on tranexamic acid nebs for 1 week with no improvement. Continue incentive spirometry and flutter valve out of bed to chair. For therapy evaluated the patient no home health PT. For possible  bronchoscopy on 04/17/2023.  History of PE: Was on Eliquis prior to admission discontinued due to hemoptysis. Will need to be off Eliquis indefinitely. CT angio of the chest on 03/17/2023 was negative for PE.  History of lung cancer: Status post surgery.  Diabetes mellitus type 2: He is on low-dose steroids which will make his blood glucose erratic, continue sliding scale insulin, add meal coverage.  Essential hypertension: Continue diltiazem.  BPH: Continue Flomax.  General anxiety disorder: Continue bupropion, BuSpar, Cymbalta, Trileptal and trazodone.  Hypovolemic hyponatremia: Low today likely due to diuretic challenge According to chart he is negative about 11 L. His weight is only down by 1 kg.  Sepsis sleep apnea: Continue CPAP at night.  Constipation: Continue MiraLAX.  Morbid obesity: Noted.   DVT prophylaxis: lovenox Family Communication:none Status is: Inpatient Remains inpatient appropriate because: Hemoptysis    Code Status:     Code Status Orders  (From admission, onward)           Start     Ordered   04/01/23 2031  Full code  Continuous       Question:  By:  Answer:  Consent: discussion documented in EHR   04/01/23 2032           Code Status History     Date Active Date Inactive Code Status Order ID Comments User Context   03/17/2023 0419 04/01/2023 1712 Full Code [id number]  Tereasa Coop, MD ED   10/20/2022 0511 10/21/2022 2108 Full Code [id number]  Gery Pray, MD ED   10/08/2022 0430 10/09/2022 1857 Full Code [id number]  Glynn Octave, MD ED  09/25/2022 1202 10/01/2022 1702 Full Code [id number]  Bobette Mo, MD ED   07/02/2022 1956 07/05/2022 1810 Full Code [id number]  Orland Mustard, MD ED   11/03/2021 0057 11/04/2021 1755 Full Code [id number]  Briscoe Deutscher, MD ED   10/08/2021 1333 10/09/2021 1719 Full Code [id number]  Al Decant, PA-C ED   10/01/2021 1932 10/04/2021 1934 Full Code [id number]  Franne Forts, DO ED   09/30/2021  2345 10/01/2021 1822 Full Code [id number]  Nira Conn, MD ED   09/23/2021 0029 09/26/2021 2237 DNR [id number]  Marinda Elk, MD ED   08/25/2021 1020 09/07/2021 1653 DNR [id number]  Joylene Draft, NP Inpatient   08/24/2021 0402 08/25/2021 1020 Full Code [id number]  Briscoe Deutscher, MD ED   08/21/2021 0031 08/23/2021 1845 Full Code [id number]  Briscoe Deutscher, MD ED   08/07/2021 0956 08/11/2021 0427 Full Code [id number]  Drema Dallas, MD ED   08/03/2021 1851 08/06/2021 0348 Full Code [id number]  Joseph Art, DO Inpatient   06/17/2021 0046 07/06/2021 1726 Full Code [id number]  Benita Gutter T, DO ED   06/04/2021 0139 06/11/2021 1609 Full Code [id number]  Howerter, Chaney Born, DO ED   05/21/2021 2320 05/25/2021 0549 Full Code [id number]  Rehman, Areeg N, DO ED   05/20/2021 0150 05/21/2021 2025 Full Code [id number]  Rehman, Areeg N, DO ED   02/04/2021 0105 02/04/2021 2154 Full Code [id number]  Charlsie Quest, MD ED   01/09/2021 0612 01/10/2021 2012 Full Code [id number]  John Giovanni, MD Inpatient   09/04/2020 0923 09/05/2020 1724 Full Code [id number]  Teddy Spike, DO ED   07/26/2020 1449 08/05/2020 1913 Full Code [id number]  Teddy Spike, DO Inpatient   04/04/2020 0033 04/07/2020 2141 Full Code [id number]  Eduard Clos, MD ED   01/04/2020 1615 01/06/2020 1904 Full Code [id number]  Teddy Spike, DO Inpatient   12/26/2019 1039 12/28/2019 1810 Full Code [id number]  Jonah Blue, MD ED   07/12/2019 0557 07/15/2019 1904 Full Code [id number]  Marily Memos, MD ED   07/03/2019 0016 07/03/2019 2011 Full Code [id number]  Bobette Mo, MD ED   07/03/2019 0016 07/03/2019 0016 Full Code [id number]  Bobette Mo, MD ED   06/23/2019 0425 06/25/2019 2122 Full Code [id number]  Thalia Party, DO ED   10/10/2018 2021 10/13/2018 1641 Full Code [id number]  Ward, Chase Picket, PA-C ED   04/30/2018 1759 05/01/2018 1714 Full Code [id number]  Dietrich Pates, PA-C ED   02/20/2018 1759 02/21/2018 1452 Full Code [id number]  Sabas Sous,  MD ED   07/16/2017 0309 07/21/2017 1734 Full Code [id number]  Hillary Bow, DO ED   09/17/2016 2107 09/18/2016 2055 Full Code [id number]  Freddrick March, MD ED   06/27/2016 1523 07/07/2016 1718 Full Code [id number]  Ardelle Balls, PA-C ED   05/23/2014 2356 05/26/2014 1656 Full Code [id number]  Pearson Grippe, MD Inpatient   01/21/2013 2202 01/23/2013 2202 Full Code [id number]  Angus Seller PA-C ED   01/12/2013 0020 01/12/2013 0641 Full Code [id number]  Arthor Captain, PA-C ED   01/10/2013 0122 01/11/2013 2122 Full Code [id number]  Eduard Clos, MD Inpatient         IV Access:   Peripheral IV   Procedures and diagnostic studies:   No results found.   Medical Consultants:   None.   Subjective:    Mario [name] continues to have hemoptysis now changed to brown color  Objective:  Vitals:   04/14/23 1818 04/14/23 2008 04/15/23 0448 04/15/23 0500  BP:  (!) 150/94 117/60   Pulse:  94 70   Resp:  17 15   Temp:  98.6 F (37 C) (!) 97.5 F (36.4 C)   TempSrc:  Oral Oral   SpO2: 97% 93% 95%   Weight:    135.2 kg  Height:       SpO2: 95 % O2 Flow Rate (L/min): 4 L/min FiO2 (%): 40 %   Intake/Output Summary (Last 24 hours) at 04/15/2023 0746 Last data filed at 04/15/2023 0600 Gross per 24 hour  Intake 1120 ml  Output 1650 ml  Net -530 ml   Filed Weights   04/05/23 0500 04/14/23 0526 04/15/23 0500  Weight: 135.7 kg 134.7 kg 135.2 kg    Exam: General exam: In no acute distress. Respiratory system: Good air movement and clear to auscultation. Cardiovascular system: S1 & S2 heard, RRR. No JVD. Gastrointestinal system: Abdomen is nondistended, soft and nontender.  Extremities: No pedal edema. Skin: No rashes, lesions or ulcers Psychiatry: Judgement and insight appear normal. Mood & affect appropriate.  Data Reviewed:    Labs: Basic Metabolic Panel: Recent Labs  Lab 04/13/23 0506 04/14/23 0507 04/15/23 0523  NA 137 132* 134*  K 3.7 3.8 4.8  CL  97* 96* 101  CO2 29 25 25   GLUCOSE 145* 145* 180*  BUN 23 25* 19  CREATININE 0.82 0.96 0.86  CALCIUM 8.9 8.4* 8.4*  MG  --  2.3 2.1   GFR Estimated Creatinine Clearance: 115.2 mL/min (by C-G formula based on SCr of 0.86 mg/dL). Liver Function Tests: No results for input(s): "AST", "ALT", "ALKPHOS", "BILITOT", "PROT", "ALBUMIN" in the last 168 hours. No results for input(s): "LIPASE", "AMYLASE" in the last 168 hours. No results for input(s): "AMMONIA" in the last 168 hours. Coagulation profile No results for input(s): "INR", "PROTIME" in the last 168 hours. COVID-19 Labs  No results for input(s): "DDIMER", "FERRITIN", "LDH", "CRP" in the last 72 hours.  Lab Results  Component Value Date   SARSCOV2NAA NEGATIVE 03/17/2023   SARSCOV2NAA NEGATIVE 10/29/2022   SARSCOV2NAA NEGATIVE 10/19/2022   SARSCOV2NAA POSITIVE (A) 09/25/2022    CBC: Recent Labs  Lab 04/13/23 0506  WBC 12.1*  HGB 13.6  HCT 42.7  MCV 94.3  PLT 306   Cardiac Enzymes: No results for input(s): "CKTOTAL", "CKMB", "CKMBINDEX", "TROPONINI" in the last 168 hours. BNP (last 3 results) No results for input(s): "PROBNP" in the last 8760 hours. CBG: Recent Labs  Lab 04/13/23 2118 04/14/23 0800 04/14/23 1204 04/14/23 1649 04/14/23 2004  GLUCAP 178* 138* 242* 347* 193*   D-Dimer: No results for input(s): "DDIMER" in the last 72 hours. Hgb A1c: No results for input(s): "HGBA1C" in the last 72 hours. Lipid Profile: No results for input(s): "CHOL", "HDL", "LDLCALC", "TRIG", "CHOLHDL", "LDLDIRECT" in the last 72 hours. Thyroid function studies: No results for input(s): "TSH", "T4TOTAL", "T3FREE", "THYROIDAB" in the last 72 hours.  Invalid input(s): "FREET3" Anemia work up: No results for input(s): "VITAMINB12", "FOLATE", "FERRITIN", "TIBC", "IRON", "RETICCTPCT" in the last 72 hours. Sepsis Labs: Recent Labs  Lab 04/13/23 0506  WBC 12.1*   Microbiology Recent Results (from the past 240 hours)   Expectorated Sputum Assessment w Gram Stain, Rflx to Resp Cult     Status: None   Collection Time: 04/12/23  3:47 AM   Specimen: Expectorated Sputum  Result Value Ref Range Status   Specimen Description EXPECTORATED SPUTUM  Final   Special  Requests NONE  Final   Sputum evaluation   Final    THIS SPECIMEN IS ACCEPTABLE FOR SPUTUM CULTURE Performed at [facility], [address], [place], [place] [address]    Report Status 04/12/2023 FINAL  Final  Culture, Respiratory w Gram Stain     Status: None   Collection Time: 04/12/23  3:47 AM  Result Value Ref Range Status   Specimen Description   Final    EXPECTORATED SPUTUM Performed at [facility], [address] [place], [place] [address]    Special Requests   Final    NONE Reflexed from [id number] Performed at [facility], [address] [place], [place] [address]    Gram Stain NO WBC SEEN RARE GRAM POSITIVE COCCI   Final   Culture   Final    Normal respiratory flora-no Staph aureus or Pseudomonas seen Performed at [facility], [address], [place], [place] [address]    Report Status 04/14/2023 FINAL  Final     Medications:    arformoterol  15 mcg Nebulization BID   ascorbic acid  500 mg Oral Daily   aspirin EC  81 mg Oral Daily   azelastine  2 spray Each Nare BID   budesonide (PULMICORT) nebulizer solution  0.25 mg Nebulization BID   buPROPion  300 mg Oral q morning   busPIRone  15 mg Oral Daily   cholecalciferol  1,000 Units Oral Daily   diltiazem  180 mg Oral Daily   docusate sodium  200 mg Oral BID   doxycycline  100 mg Oral Q12H   DULoxetine  60 mg Oral BID   fluticasone  2 spray Each Nare Daily   folic acid  1 mg Oral Daily   gabapentin  100 mg Oral TID   guaiFENesin  1,200 mg Oral BID   insulin aspart  0-5 Units Subcutaneous QHS   insulin aspart  0-9 Units Subcutaneous TID WC   insulin aspart  3 Units Subcutaneous TID WC   magnesium oxide  200  mg Oral Daily   melatonin  3 mg Oral QHS   multivitamin with minerals  1 tablet Oral Daily   Oxcarbazepine  600 mg Oral TID   pantoprazole  40 mg Oral BID   polyethylene glycol  17 g Oral TID   predniSONE  30 mg Oral Q breakfast   revefenacin  175 mcg Nebulization Daily   sodium chloride  2 spray Each Nare BID   tamsulosin  0.8 mg Oral Daily   traZODone  75 mg Oral QHS   Continuous Infusions:  piperacillin-tazobactam (ZOSYN)  IV 3.375 g (04/15/23 0511)   vancomycin 1,250 mg (04/15/23 0145)      LOS: 11 days   Marinda Elk  [facility]  04/15/2023, 7:46 AM

## 2023-04-16 LAB — MAGNESIUM: Magnesium: 2.1 mg/dL (ref 1.7–2.4)

## 2023-04-16 LAB — BASIC METABOLIC PANEL
Anion gap: 7 (ref 5–15)
BUN: 18 mg/dL (ref 8–23)
CO2: 27 mmol/L (ref 22–32)
Calcium: 8.3 mg/dL — ABNORMAL LOW (ref 8.9–10.3)
Chloride: 98 mmol/L (ref 98–111)
Creatinine, Ser: 0.78 mg/dL (ref 0.61–1.24)
GFR, Estimated: >60 mL/min (ref >60)
Glucose, Bld: 155 mg/dL — ABNORMAL HIGH (ref 70–99)
Potassium: 3.9 mmol/L (ref 3.5–5.1)
Sodium: 132 mmol/L — ABNORMAL LOW (ref 135–145)

## 2023-04-16 LAB — GLUCOSE, CAPILLARY
Glucose-Capillary: 145 mg/dL — ABNORMAL HIGH (ref 70–99)
Glucose-Capillary: 140 mg/dL — ABNORMAL HIGH (ref 70–99)
Glucose-Capillary: 263 mg/dL — ABNORMAL HIGH (ref 70–99)
Glucose-Capillary: 151 mg/dL — ABNORMAL HIGH (ref 70–99)

## 2023-04-16 NOTE — Progress Notes (Signed)
   NAME:  KAYLIN SCHELLENBERG, MRN:  [id number], DOB:  Feb 29, 1951, LOS: 12 ADMISSION DATE:  04/01/2023, CONSULTATION DATE:  2/3 REFERRING MD:  KC, CHIEF COMPLAINT:  hemoptysis    History of Present Illness:  73 year old male w/ chronic resp failure in setting of COPD (5lpm baseline) remotely seen by Icard (not seen in office > 2 yrs). Admitted from 1/17 to 2/1 w/ what AECOPD in setting of RSV infection and prob CAP after treated w/ ABX and systemic steroids.. was sent home on 5 liters rest and instructed to increase to 6 lpm w/ activity. Apparently when home rested then got up to move, walked up flight of stairs, sats 80s, marked dyspnea, HR 130s. Reported back to the ER that evening (same day of discharge)  In ER  Wbc 12.4, BNP 29/ CXR w/ right basilar band-like atx, and persistent left sided airspace disease.  Continued on oxygen, scheduled BDs ordered, got IV steroids followed by orals.  On 2/1 started having episodes of hemoptysis.  This has been fairly persistent.  It has been bright red, about a teaspoon in size happening fairly frequently at least once every 2 hours and has persisted over night into 2/3 he also continues to have resting dyspnea and thus PCCM asked to eval  Pertinent  Medical History  Chronic resp failure 5 lpm baseline, remote PE (on DOAC), lung cancer s/p XRT, type II DM, HTN, BPH, GAD.grade I diastolic dysfxn ef 60-65% Significant Hospital Events: Including procedures, antibiotic start and stop dates in addition to other pertinent events   2/1 discharged and readmitted following treatment for RSV infection possible And AECOPD placed back on steroids and nebulizers 2/3 pulm consulted for shortness of breath and hemoptysis 2/15  Interim History / Subjective:  Still with hemoptysis -Was showing me pictures of what he is coughing up Objective   Blood pressure 139/70, pulse 68, temperature 98 F (36.7 C), resp. rate 18, height 6\' 2"  (1.88 m), weight (!) 139 kg, SpO2 (!) 86%.     FiO2 (%):  [36 %] 36 %   Intake/Output Summary (Last 24 hours) at 04/16/2023 1050 Last data filed at 04/16/2023 1000 Gross per 24 hour  Intake 1834.76 ml  Output 1700 ml  Net 134.76 ml   Filed Weights   04/14/23 0526 04/15/23 0500 04/16/23 0527  Weight: 134.7 kg 135.2 kg (!) 139 kg   Physical Exam: Does not appear to be in obvious distress Moist oral mucosa Clear but decreased breath sounds bilaterally S1-S2 appreciated Bowel sounds appreciated Alert and oriented x 3, sitting at the side of the bed  I did personally review his CT scan of the chest showing multilobar infiltrate left lower lobe   Resolved Hospital Problem list     Assessment & Plan:   Hemoptysis persists -Has been on antibiotics, steroids -Has been off anticoagulation -Minimal but still persistent hemoptysis -Currently on 20 of prednisone  No specific organism in his sputum but does show Gram positive cocci that is rare  Chronic hypoxemic respiratory failure secondary to COPD Chronic left lower lobe volume loss -Recent RSV, community-acquired pneumonia  Had a pulmonary embolism in 2022 -Off anticoagulation at present  History of obstructive sleep apnea -Has not been able to tolerate CPAP  Tentative plans will be bronchoscopy Monday plus or minus cryo if site of bleeding identifiable -N.p.o. order placed for tonight -Discussed with patient  Virl Diamond, MD [place] [facility] Pager: See Loretha Stapler

## 2023-04-16 NOTE — H&P (View-Only) (Signed)
   NAME:  Mario [name], MRN:  [id number], DOB:  Feb 29, 1951, LOS: 12 ADMISSION DATE:  04/01/2023, CONSULTATION DATE:  2/3 REFERRING MD:  KC, CHIEF COMPLAINT:  hemoptysis    History of Present Illness:  73 year old male w/ chronic resp failure in setting of COPD (5lpm baseline) remotely seen by Icard (not seen in office > 2 yrs). Admitted from 1/17 to 2/1 w/ what AECOPD in setting of RSV infection and prob CAP after treated w/ ABX and systemic steroids.. was sent home on 5 liters rest and instructed to increase to 6 lpm w/ activity. Apparently when home rested then got up to move, walked up flight of stairs, sats 80s, marked dyspnea, HR 130s. Reported back to the ER that evening (same day of discharge)  In ER  Wbc 12.4, BNP 29/ CXR w/ right basilar band-like atx, and persistent left sided airspace disease.  Continued on oxygen, scheduled BDs ordered, got IV steroids followed by orals.  On 2/1 started having episodes of hemoptysis.  This has been fairly persistent.  It has been bright red, about a teaspoon in size happening fairly frequently at least once every 2 hours and has persisted over night into 2/3 he also continues to have resting dyspnea and thus PCCM asked to eval  Pertinent  Medical History  Chronic resp failure 5 lpm baseline, remote PE (on DOAC), lung cancer s/p XRT, type II DM, HTN, BPH, GAD.grade I diastolic dysfxn ef 60-65% Significant Hospital Events: Including procedures, antibiotic start and stop dates in addition to other pertinent events   2/1 discharged and readmitted following treatment for RSV infection possible And AECOPD placed back on steroids and nebulizers 2/3 pulm consulted for shortness of breath and hemoptysis 2/15  Interim History / Subjective:  Still with hemoptysis -Was showing me pictures of what he is coughing up Objective   Blood pressure 139/70, pulse 68, temperature 98 F (36.7 C), resp. rate 18, height 6\' 2"  (1.88 m), weight (!) 139 kg, SpO2 (!) 86%.     FiO2 (%):  [36 %] 36 %   Intake/Output Summary (Last 24 hours) at 04/16/2023 1050 Last data filed at 04/16/2023 1000 Gross per 24 hour  Intake 1834.76 ml  Output 1700 ml  Net 134.76 ml   Filed Weights   04/14/23 0526 04/15/23 0500 04/16/23 0527  Weight: 134.7 kg 135.2 kg (!) 139 kg   Physical Exam: Does not appear to be in obvious distress Moist oral mucosa Clear but decreased breath sounds bilaterally S1-S2 appreciated Bowel sounds appreciated Alert and oriented x 3, sitting at the side of the bed  I did personally review his CT scan of the chest showing multilobar infiltrate left lower lobe   Resolved Hospital Problem list     Assessment & Plan:   Hemoptysis persists -Has been on antibiotics, steroids -Has been off anticoagulation -Minimal but still persistent hemoptysis -Currently on 20 of prednisone  No specific organism in his sputum but does show Gram positive cocci that is rare  Chronic hypoxemic respiratory failure secondary to COPD Chronic left lower lobe volume loss -Recent RSV, community-acquired pneumonia  Had a pulmonary embolism in 2022 -Off anticoagulation at present  History of obstructive sleep apnea -Has not been able to tolerate CPAP  Tentative plans will be bronchoscopy Monday plus or minus cryo if site of bleeding identifiable -N.p.o. order placed for tonight -Discussed with patient  Virl Diamond, MD [place] [facility] Pager: See Loretha Stapler

## 2023-04-16 NOTE — Plan of Care (Signed)
   Problem: Coping: Goal: Ability to adjust to condition or change in health will improve Outcome: Progressing   Problem: Fluid Volume: Goal: Ability to maintain a balanced intake and output will improve Outcome: Progressing   Problem: Metabolic: Goal: Ability to maintain appropriate glucose levels will improve Outcome: Progressing

## 2023-04-16 NOTE — Progress Notes (Signed)
[facility] PROGRESS NOTE    Progress Note  Mario [name]  ZOX:[id number] DOB: April 25, 1950 DOA: 04/01/2023 PCP: [name]     Brief Narrative:   Mario [name] is an 73 y.o. 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  Came back to the ED with a white count of 13 BNP of 21 chest x-ray showed right basilar infiltrate with persistent airspace disease.  Due to ongoing shortness of breath hemoptysis a CT of the chest was done that showed large left lower lobe infiltrate some pulmonary fibrosis mild loculated fluid collection pulmonary and critical care was consulted and related that the limited endobronchial due to his oxygen requirement.  He was continued on empiric antibiotics and diuretics.  He continues to have hemoptysis despite 1 week of anticoagulation interruption.   Assessment/Plan:   Acute respiratory failure with hypoxia in a patient with COPD on 5 L of oxygen dependent with a recent pneumonia and RSV infection readmitted and found to have hemoptysis while on DOAC: She does have hemoptysis. Currently requiring 4-5 L of oxygen to keep saturations greater than 90%. He was continue on inhalers and steroid taper. Will need complete antibiotics for 7 days. Continue incentive spirometry and flutter valve out of bed to chair. For therapy evaluated the patient no home health PT. For possible bronchoscopy on 04/17/2023.  History of PE: Was on Eliquis prior to admission discontinued due to hemoptysis. Will need to be off Eliquis indefinitely. CT angio of the chest on 03/17/2023 was negative for PE.  History of lung cancer: Status post surgery.  Diabetes mellitus type 2: He is on low-dose  steroids which will make his blood glucose erratic, continue sliding scale insulin, add meal coverage.  Essential hypertension: Continue diltiazem.  BPH: Continue Flomax.  General anxiety disorder: Continue bupropion, BuSpar, Cymbalta, Trileptal and trazodone.  Hypovolemic hyponatremia: Low today likely due to diuretic challenge According to chart he is negative about 11 L. His weight is only down by 1 kg.  Sepsis sleep apnea: Continue CPAP at night.  Constipation: Continue MiraLAX.  Morbid obesity: Noted.   DVT prophylaxis: lovenox Family Communication:none Status is: Inpatient Remains inpatient appropriate because: Hemoptysis    Code Status:     Code Status Orders  (From admission, onward)           Start     Ordered   04/01/23 2031  Full code  Continuous       Question:  By:  Answer:  Consent: discussion documented in EHR   04/01/23 2032           Code Status History     Date Active Date Inactive Code Status Order ID Comments User Context   03/17/2023 0419 04/01/2023 1712 Full Code [id number]  Tereasa Coop, MD ED   10/20/2022 0511 10/21/2022 2108 Full Code [id number]  Gery Pray, MD ED   10/08/2022 0430 10/09/2022 1857 Full Code [id number]  Glynn Octave, MD ED   09/25/2022 1202 10/01/2022 1702 Full Code [id number]  Bobette Mo, MD ED   07/02/2022 1956 07/05/2022 1810 Full Code [id number]  Orland Mustard, MD ED   11/03/2021 0057 11/04/2021 1755 Full Code [id number]  Briscoe Deutscher, MD ED   10/08/2021 1333  10/09/2021 1719 Full Code [id number]  Al Decant, PA-C ED   10/01/2021 1932 10/04/2021 1934 Full Code [id number]  Franne Forts, DO ED   09/30/2021 2345 10/01/2021 1822 Full Code [id number]  Nira Conn, MD ED   09/23/2021 0029 09/26/2021 2237 DNR [id number]  Marinda Elk, MD ED   08/25/2021 1020 09/07/2021 1653 DNR [id number]  Joylene Draft, NP Inpatient   08/24/2021 0402 08/25/2021 1020 Full Code [id number]  Briscoe Deutscher, MD ED    08/21/2021 0031 08/23/2021 1845 Full Code [id number]  Briscoe Deutscher, MD ED   08/07/2021 0956 08/11/2021 0427 Full Code [id number]  Drema Dallas, MD ED   08/03/2021 1851 08/06/2021 0348 Full Code [id number]  Joseph Art, DO Inpatient   06/17/2021 0046 07/06/2021 1726 Full Code [id number]  Benita Gutter T, DO ED   06/04/2021 0139 06/11/2021 1609 Full Code [id number]  Howerter, Chaney Born, DO ED   05/21/2021 2320 05/25/2021 0549 Full Code [id number]  Rehman, Areeg N, DO ED   05/20/2021 0150 05/21/2021 2025 Full Code [id number]  Rehman, Areeg N, DO ED   02/04/2021 0105 02/04/2021 2154 Full Code [id number]  Charlsie Quest, MD ED   01/09/2021 0612 01/10/2021 2012 Full Code [id number]  John Giovanni, MD Inpatient   09/04/2020 0923 09/05/2020 1724 Full Code [id number]  Teddy Spike, DO ED   07/26/2020 1449 08/05/2020 1913 Full Code [id number]  Teddy Spike, DO Inpatient   04/04/2020 0033 04/07/2020 2141 Full Code [id number]  Eduard Clos, MD ED   01/04/2020 1615 01/06/2020 1904 Full Code [id number]  Teddy Spike, DO Inpatient   12/26/2019 1039 12/28/2019 1810 Full Code [id number]  Jonah Blue, MD ED   07/12/2019 0557 07/15/2019 1904 Full Code [id number]  Marily Memos, MD ED   07/03/2019 0016 07/03/2019 2011 Full Code [id number]  Bobette Mo, MD ED   07/03/2019 0016 07/03/2019 0016 Full Code [id number]  Bobette Mo, MD ED   06/23/2019 0425 06/25/2019 2122 Full Code [id number]  Thalia Party, DO ED   10/10/2018 2021 10/13/2018 1641 Full Code [id number]  Ward, Chase Picket, PA-C ED   04/30/2018 1759 05/01/2018 1714 Full Code [id number]  Dietrich Pates, PA-C ED   02/20/2018 1759 02/21/2018 1452 Full Code [id number]  Sabas Sous, MD ED   07/16/2017 0309 07/21/2017 1734 Full Code [id number]  Hillary Bow, DO ED   09/17/2016 2107 09/18/2016 2055 Full Code [id number]  Freddrick March, MD ED   06/27/2016 1523 07/07/2016 1718 Full Code [id number]  Ardelle Balls, PA-C ED   05/23/2014 2356 05/26/2014 1656 Full Code [id number]  Pearson Grippe, MD Inpatient   01/21/2013 2202 01/23/2013 2202 Full Code [id number]  Angus Seller PA-C ED   01/12/2013 0020 01/12/2013 0641 Full Code [id number]  Arthor Captain, PA-C ED   01/10/2013 0122 01/11/2013 2122 Full Code [id number]  Eduard Clos, MD Inpatient         IV Access:   Peripheral IV   Procedures and diagnostic studies:   No results found.   Medical Consultants:   None.   Subjective:    Mario [name] continue to have hemoptysis  Objective:    Vitals:   04/15/23 2059 04/15/23 2204 04/16/23 0527 04/16/23 0607  BP:  120/62  139/70  Pulse:  75  68  Resp:  20  18  Temp:  98 F (36.7 C)  98 F (36.7 C)  TempSrc:  Oral    SpO2: 95%  94%  96%  Weight:   (!) 139 kg   Height:       SpO2: 96 % O2 Flow Rate (L/min): 3 L/min FiO2 (%): 36 %   Intake/Output Summary (Last 24 hours) at 04/16/2023 0757 Last data filed at 04/16/2023 0204 Gross per 24 hour  Intake 2272.1 ml  Output 1700 ml  Net 572.1 ml   Filed Weights   04/14/23 0526 04/15/23 0500 04/16/23 0527  Weight: 134.7 kg 135.2 kg (!) 139 kg    Exam: General exam: In no acute distress. Respiratory system: Good air movement and clear to auscultation. Cardiovascular system: S1 & S2 heard, RRR. No JVD. Gastrointestinal system: Abdomen is nondistended, soft and nontender.  Extremities: No pedal edema. Skin: No rashes, lesions or ulcers Psychiatry: Judgement and insight appear normal. Mood & affect appropriate.  Data Reviewed:    Labs: Basic Metabolic Panel: Recent Labs  Lab 04/13/23 0506 04/14/23 0507 04/15/23 0523 04/16/23 0451  NA 137 132* 134* 132*  K 3.7 3.8 4.8 3.9  CL 97* 96* 101 98  CO2 29 25 25 27   GLUCOSE 145* 145* 180* 155*  BUN 23 25* 19 18  CREATININE 0.82 0.96 0.86 0.78  CALCIUM 8.9 8.4* 8.4* 8.3*  MG  --  2.3 2.1 2.1   GFR Estimated Creatinine Clearance: 125.7 mL/min (by C-G formula based on SCr of 0.78 mg/dL). Liver Function Tests: No results for input(s):  "AST", "ALT", "ALKPHOS", "BILITOT", "PROT", "ALBUMIN" in the last 168 hours. No results for input(s): "LIPASE", "AMYLASE" in the last 168 hours. No results for input(s): "AMMONIA" in the last 168 hours. Coagulation profile No results for input(s): "INR", "PROTIME" in the last 168 hours. COVID-19 Labs  No results for input(s): "DDIMER", "FERRITIN", "LDH", "CRP" in the last 72 hours.  Lab Results  Component Value Date   SARSCOV2NAA NEGATIVE 03/17/2023   SARSCOV2NAA NEGATIVE 10/29/2022   SARSCOV2NAA NEGATIVE 10/19/2022   SARSCOV2NAA POSITIVE (A) 09/25/2022    CBC: Recent Labs  Lab 04/13/23 0506  WBC 12.1*  HGB 13.6  HCT 42.7  MCV 94.3  PLT 306   Cardiac Enzymes: No results for input(s): "CKTOTAL", "CKMB", "CKMBINDEX", "TROPONINI" in the last 168 hours. BNP (last 3 results) No results for input(s): "PROBNP" in the last 8760 hours. CBG: Recent Labs  Lab 04/14/23 2004 04/15/23 0746 04/15/23 1201 04/15/23 1738 04/15/23 2209  GLUCAP 193* 163* 169* 156* 176*   D-Dimer: No results for input(s): "DDIMER" in the last 72 hours. Hgb A1c: No results for input(s): "HGBA1C" in the last 72 hours. Lipid Profile: No results for input(s): "CHOL", "HDL", "LDLCALC", "TRIG", "CHOLHDL", "LDLDIRECT" in the last 72 hours. Thyroid function studies: No results for input(s): "TSH", "T4TOTAL", "T3FREE", "THYROIDAB" in the last 72 hours.  Invalid input(s): "FREET3" Anemia work up: No results for input(s): "VITAMINB12", "FOLATE", "FERRITIN", "TIBC", "IRON", "RETICCTPCT" in the last 72 hours. Sepsis Labs: Recent Labs  Lab 04/13/23 0506  WBC 12.1*   Microbiology Recent Results (from the past 240 hours)  Expectorated Sputum Assessment w Gram Stain, Rflx to Resp Cult     Status: None   Collection Time: 04/12/23  3:47 AM   Specimen: Expectorated Sputum  Result Value Ref Range Status   Specimen Description EXPECTORATED SPUTUM  Final   Special Requests NONE  Final   Sputum evaluation    Final    THIS SPECIMEN IS ACCEPTABLE FOR SPUTUM CULTURE Performed at [facility], [address], [place], [place] [address]    Report Status  04/12/2023 FINAL  Final  Culture, Respiratory w Gram Stain     Status: None   Collection Time: 04/12/23  3:47 AM  Result Value Ref Range Status   Specimen Description   Final    EXPECTORATED SPUTUM Performed at [facility], [address] [place], [place] [address]    Special Requests   Final    NONE Reflexed from [id number] Performed at [facility], [address] [place], [place] [address]    Gram Stain NO WBC SEEN RARE GRAM POSITIVE COCCI   Final   Culture   Final    Normal respiratory flora-no Staph aureus or Pseudomonas seen Performed at [facility], [address], [place], [place] [address]    Report Status 04/14/2023 FINAL  Final     Medications:    arformoterol  15 mcg Nebulization BID   ascorbic acid  500 mg Oral Daily   aspirin EC  81 mg Oral Daily   azelastine  2 spray Each Nare BID   budesonide (PULMICORT) nebulizer solution  0.25 mg Nebulization BID   buPROPion  300 mg Oral q morning   busPIRone  15 mg Oral Daily   cholecalciferol  1,000 Units Oral Daily   diltiazem  180 mg Oral Daily   docusate sodium  200 mg Oral BID   doxycycline  100 mg Oral Q12H   DULoxetine  60 mg Oral BID   fluticasone  2 spray Each Nare Daily   folic acid  1 mg Oral Daily   gabapentin  100 mg Oral TID   guaiFENesin  1,200 mg Oral BID   insulin aspart  0-5 Units Subcutaneous QHS   insulin aspart  0-9 Units Subcutaneous TID WC   insulin aspart  3 Units Subcutaneous TID WC   insulin glargine-yfgn  5 Units Subcutaneous BID   magnesium oxide  200 mg Oral Daily   melatonin  3 mg Oral QHS   multivitamin with minerals  1 tablet Oral Daily   Oxcarbazepine  600 mg Oral TID   pantoprazole  40 mg Oral BID   polyethylene glycol  17 g Oral TID   predniSONE  20 mg Oral Q breakfast    revefenacin  175 mcg Nebulization Daily   sodium chloride  2 spray Each Nare BID   tamsulosin  0.8 mg Oral Daily   traZODone  75 mg Oral QHS   Continuous Infusions:  piperacillin-tazobactam (ZOSYN)  IV 3.375 g (04/16/23 0527)   vancomycin 1,250 mg (04/16/23 0155)      LOS: 12 days   Marinda Elk  [facility]  04/16/2023, 7:57 AM

## 2023-04-16 NOTE — Progress Notes (Signed)
Mobility Specialist - Progress Note   04/16/23 1037  Oxygen Therapy  SpO2 (!) 86 %  O2 Device Nasal Cannula  O2 Flow Rate (L/min) 4 L/min  Patient Activity (if Appropriate) Ambulating  Mobility  Activity Ambulated independently in hallway  Level of Assistance Independent  Assistive Device None  Distance Ambulated (ft) 80 ft  Activity Response Tolerated well  Mobility Referral Yes  Mobility visit 1 Mobility  Mobility Specialist Start Time (ACUTE ONLY) 1018  Mobility Specialist Stop Time (ACUTE ONLY) 1033  Mobility Specialist Time Calculation (min) (ACUTE ONLY) 15 min   Pt received in bed and agreeable to mobility. Upon returning to room, pt desat to 86%. Encouraged pursed lip breaths bringing SpO2 to 94%. No complaints during session. Pt to EOB after session with all needs met.    Pre-mobility: 82 HR, 93% SpO2 (4L [place]) During mobility: 98 HR, 86% SpO2 (4L [place]) Post-mobility: 83 HR, 94% SPO2 (4L [place])   Chief Technology Officer

## 2023-04-16 NOTE — Progress Notes (Signed)
Pharmacy Antibiotic Note  Mario [name] is a 73 y.o. male admitted on 04/01/2023 with persistent dyspnea. He was admitted 1/17 to 2/1 and presented back on same day of discharge. He has been having hemoptysis, anticoagulation held. He was started on nebulizers and an empiric course of doxycycline. Pharmacy has been consulted for vancomycin and Zosyn dosing.  Plan: -Continue vancomycin 1250 mg IV q12h -Zosyn 3.375 g IV q8h extended infusion -Continue to follow renal function, cultures and clinical progress for dose adjustments and de-escalation as indicated  Height: 6\' 2"  (188 cm) Weight: (!) 139 kg (306 lb 7 oz) IBW/kg (Calculated) : 82.2  Temp (24hrs), Avg:98.2 F (36.8 C), Min:98 F (36.7 C), Max:98.5 F (36.9 C)  Recent Labs  Lab 04/13/23 0506 04/14/23 0507 04/15/23 0523 04/16/23 0451  WBC 12.1*  --   --   --   CREATININE 0.82 0.96 0.86 0.78    Estimated Creatinine Clearance: 125.7 mL/min (by C-G formula based on SCr of 0.78 mg/dL).    Allergies  Allergen Reactions   Demerol [Meperidine] Nausea And Vomiting and Other (See Comments)    Made the patient "violently sick"   Zocor [Simvastatin] Nausea And Vomiting and Other (See Comments)    Made him very jittery, also   Beet [Beta Vulgaris] Nausea And Vomiting   Liver Nausea And Vomiting    Antimicrobials this admission: Zosyn 2/13 >> Vancomycin 2/13 >> Doxycycline 2/10 >>  Dose adjustments this admission: NA  Microbiology results: 2/12 Sputum: normal flora   Thank you for allowing pharmacy to be a part of this patient's care.  Pricilla Riffle, PharmD, BCPS Clinical Pharmacist 04/16/2023 9:00 AM

## 2023-04-17 ENCOUNTER — Encounter (HOSPITAL_COMMUNITY): Payer: Self-pay | Admitting: Internal Medicine

## 2023-04-17 ENCOUNTER — Inpatient Hospital Stay (HOSPITAL_COMMUNITY): Payer: No Typology Code available for payment source | Admitting: Anesthesiology

## 2023-04-17 ENCOUNTER — Encounter (HOSPITAL_COMMUNITY)
Admission: EM | Disposition: A | Discharge status: Home / Self Care | Payer: Self-pay | Source: Skilled Nursing Facility | Attending: Internal Medicine

## 2023-04-17 DIAGNOSIS — I10 Essential (primary) hypertension: Secondary | ICD-10-CM

## 2023-04-17 DIAGNOSIS — Z87891 Personal history of nicotine dependence: Secondary | ICD-10-CM

## 2023-04-17 DIAGNOSIS — J449 Chronic obstructive pulmonary disease, unspecified: Secondary | ICD-10-CM

## 2023-04-17 DIAGNOSIS — R042 Hemoptysis: Secondary | ICD-10-CM

## 2023-04-17 HISTORY — PX: VIDEO BRONCHOSCOPY: SHX5072

## 2023-04-17 HISTORY — PX: HEMOSTASIS CONTROL: SHX6838

## 2023-04-17 HISTORY — PX: BRONCHIAL WASHINGS: SHX5105

## 2023-04-17 HISTORY — PX: CRYOTHERAPY: SHX6894

## 2023-04-17 LAB — BODY FLUID CELL COUNT WITH DIFFERENTIAL
Eos, Fluid: 3 %
Lymphs, Fluid: 9 %
Monocyte-Macrophage-Serous Fluid: 9 % — ABNORMAL LOW (ref 50–90)
Neutrophil Count, Fluid: 79 % — ABNORMAL HIGH (ref 0–25)
Total Nucleated Cell Count, Fluid: 47 uL (ref 0–1000)

## 2023-04-17 LAB — BASIC METABOLIC PANEL
Anion gap: 8 (ref 5–15)
BUN: 17 mg/dL (ref 8–23)
CO2: 26 mmol/L (ref 22–32)
Calcium: 8.3 mg/dL — ABNORMAL LOW (ref 8.9–10.3)
Chloride: 97 mmol/L — ABNORMAL LOW (ref 98–111)
Creatinine, Ser: 0.83 mg/dL (ref 0.61–1.24)
GFR, Estimated: >60 mL/min (ref >60)
Glucose, Bld: 152 mg/dL — ABNORMAL HIGH (ref 70–99)
Potassium: 3.9 mmol/L (ref 3.5–5.1)
Sodium: 131 mmol/L — ABNORMAL LOW (ref 135–145)

## 2023-04-17 LAB — GLUCOSE, CAPILLARY
Glucose-Capillary: 125 mg/dL — ABNORMAL HIGH (ref 70–99)
Glucose-Capillary: 123 mg/dL — ABNORMAL HIGH (ref 70–99)
Glucose-Capillary: 133 mg/dL — ABNORMAL HIGH (ref 70–99)
Glucose-Capillary: 151 mg/dL — ABNORMAL HIGH (ref 70–99)
Glucose-Capillary: 167 mg/dL — ABNORMAL HIGH (ref 70–99)
Glucose-Capillary: 186 mg/dL — ABNORMAL HIGH (ref 70–99)

## 2023-04-17 LAB — MAGNESIUM: Magnesium: 2 mg/dL (ref 1.7–2.4)

## 2023-04-17 SURGERY — VIDEO BRONCHOSCOPY WITHOUT FLUORO
Anesthesia: General | Laterality: Left

## 2023-04-17 MED ORDER — IPRATROPIUM-ALBUTEROL 0.5-2.5 (3) MG/3ML IN SOLN
3.0000 mL | Freq: Once | RESPIRATORY_TRACT | Status: AC
  Administered 2023-04-17: 3 mL via RESPIRATORY_TRACT

## 2023-04-17 MED ORDER — ROCURONIUM BROMIDE 10 MG/ML (PF) SYRINGE: PREFILLED_SYRINGE | INTRAVENOUS | Status: DC | PRN

## 2023-04-17 MED ORDER — PROPOFOL 500 MG/50ML IV EMUL
INTRAVENOUS | Status: DC | PRN
  Administered 2023-04-17: 150 ug/kg/min via INTRAVENOUS

## 2023-04-17 MED ORDER — ONDANSETRON HCL 4 MG/2ML IJ SOLN
INTRAMUSCULAR | Status: DC | PRN
  Administered 2023-04-17: 4 mg via INTRAVENOUS

## 2023-04-17 MED ORDER — HYDROMORPHONE HCL 1 MG/ML IJ SOLN
INTRAMUSCULAR | Status: AC
  Filled 2023-04-17: qty 1

## 2023-04-17 MED ORDER — PROPOFOL 10 MG/ML IV BOLUS
INTRAVENOUS | Status: DC | PRN
  Administered 2023-04-17: 150 mg via INTRAVENOUS

## 2023-04-17 MED ORDER — SUCCINYLCHOLINE CHLORIDE 200 MG/10ML IV SOSY
PREFILLED_SYRINGE | INTRAVENOUS | Status: DC | PRN
  Administered 2023-04-17: 100 mg via INTRAVENOUS

## 2023-04-17 MED ORDER — HYDROMORPHONE HCL 1 MG/ML IJ SOLN: 0.2500 mg | INTRAMUSCULAR | Status: DC | PRN

## 2023-04-17 MED ORDER — LIDOCAINE HCL 2 % IJ SOLN
INTRAMUSCULAR | Status: AC
  Filled 2023-04-17: qty 20

## 2023-04-17 MED ORDER — LIDOCAINE 2% (20 MG/ML) 5 ML SYRINGE
INTRAMUSCULAR | Status: DC | PRN
  Administered 2023-04-17: 100 mg via INTRAVENOUS

## 2023-04-17 MED ORDER — LACTATED RINGERS IV SOLN: INTRAVENOUS | Status: DC | PRN

## 2023-04-17 MED ORDER — SODIUM CHLORIDE 0.9 % IV SOLN
INTRAVENOUS | Status: AC | PRN
  Administered 2023-04-17: 250 mL via INTRAVENOUS

## 2023-04-17 MED ORDER — OXYCODONE HCL 5 MG/5ML PO SOLN: 5.0000 mg | Freq: Once | ORAL | Status: AC | PRN

## 2023-04-17 MED ORDER — LIDOCAINE HCL (PF) 2% IJ FOR NEBU
5.0000 mL | Freq: Once | RESPIRATORY_TRACT | Status: AC
  Administered 2023-04-17: 5 mL via RESPIRATORY_TRACT
  Filled 2023-04-17: qty 5

## 2023-04-17 MED ORDER — PREDNISONE 5 MG PO TABS
10.0000 mg | ORAL_TABLET | Freq: Every day | ORAL | Status: DC
  Administered 2023-04-17 – 2023-04-18 (×2): 10 mg via ORAL
  Filled 2023-04-17 (×2): qty 2

## 2023-04-17 MED ORDER — AMISULPRIDE (ANTIEMETIC) 5 MG/2ML IV SOLN: 10.0000 mg | Freq: Once | INTRAVENOUS | Status: DC | PRN

## 2023-04-17 MED ORDER — SODIUM CHLORIDE 0.9 % IV SOLN: 12.5000 mg | INTRAVENOUS | Status: DC | PRN

## 2023-04-17 MED ORDER — HYDROMORPHONE HCL 1 MG/ML IJ SOLN
0.2500 mg | Freq: Once | INTRAMUSCULAR | Status: AC
  Administered 2023-04-17: 0.25 mg via INTRAVENOUS

## 2023-04-17 MED ORDER — IPRATROPIUM-ALBUTEROL 0.5-2.5 (3) MG/3ML IN SOLN
RESPIRATORY_TRACT | Status: AC
  Filled 2023-04-17: qty 3

## 2023-04-17 MED ORDER — OXYCODONE HCL 5 MG PO TABS
5.0000 mg | ORAL_TABLET | Freq: Once | ORAL | Status: AC | PRN
  Administered 2023-04-17: 5 mg via ORAL
  Filled 2023-04-17: qty 1

## 2023-04-17 NOTE — Interval H&P Note (Signed)
No changes, agreeable to bronch.

## 2023-04-17 NOTE — Progress Notes (Signed)
Pt presented to endoscopy today for bronchoscopy with cryotherapy and BAL with MD Katrinka Blazing. Post-operatively, pt required 8 LPM by simple mask to maintain O2 saturation, developed expiratory wheezing. Pt noted to be continuously coughing. MD Katrinka Blazing notified and VO for a duoneb. Medication given as ordered.  After the duoneb, pt began c/o 4/10 pleuritic chest pain. MD Katrinka Blazing notified of this and placed order for 0.25 mg dilaudid. MDA Miller notified and agreeable to prescribed medication. Medication given as ordered.  MD Katrinka Blazing arrived at bedside and ordered lidocaine nebulizer. Pt c/o 5/10 sore throat but denied further chest pain. Medication given as ordered. Post-lidocaine, pt coughing less, endorses relief of throat pain, states throat is numb. Educated pt that this is an expected effect of lidocaine neb.  As of 1443, pt satting 91% on 5 LPM by nasal cannula. [facility] en route to transport pt back to [facility]. Report called to [place], RN, [employer].  Eulas Post, RN 04/17/23 ,2:53 PM

## 2023-04-17 NOTE — Transfer of Care (Signed)
Immediate Anesthesia Transfer of Care Note  Patient: Mario [name]  Procedure(s) Performed: VIDEO BRONCHOSCOPY WITHOUT FLUORO (Left) CRYOTHERAPY BRONCHIAL WASHINGS HEMOSTASIS CONTROL  Patient Location: Endoscopy Unit  Anesthesia Type:General  Level of Consciousness: awake, alert , and oriented  Airway & Oxygen Therapy: Patient Spontanous Breathing and Patient connected to face mask oxygen  Post-op Assessment: Report given to RN and Post -op Vital signs reviewed and stable  Post vital signs: Reviewed and stable  Last Vitals:  Vitals Value Taken Time  BP 128/80 04/17/23 1333  Temp    Pulse 111 04/17/23 1336  Resp 20 04/17/23 1336  SpO2 91 % 04/17/23 1336  Vitals shown include unfiled device data.  Last Pain:  Vitals:   04/17/23 1214  TempSrc: Temporal  PainSc: 0-No pain      Patients Stated Pain Goal: 2 (04/16/23 1012)  Complications: No notable events documented.

## 2023-04-17 NOTE — Progress Notes (Signed)
PT Cancellation Note  Patient Details Name: Mario [name] MRN: [id number] DOB: [birth year]   Cancelled Treatment:    Reason Eval/Treat Not Completed: Patient at procedure or test/unavailable (pt stated he's leaving, "any minute now for a brochoscopy at [facility]". Pt requested PT attempt later. Will follow.)   Tamala Ser PT 04/17/2023  Acute Rehabilitation Services  Office [phone]

## 2023-04-17 NOTE — Progress Notes (Signed)
PT refused CPT first rounds.

## 2023-04-17 NOTE — Plan of Care (Signed)
   Problem: Education: Goal: Ability to describe self-care measures that may prevent or decrease complications (Diabetes Survival Skills Education) will improve Outcome: Progressing Goal: Individualized Educational Video(s) Outcome: Progressing   Problem: Coping: Goal: Ability to adjust to condition or change in health will improve Outcome: Progressing

## 2023-04-17 NOTE — Anesthesia Procedure Notes (Signed)
Procedure Name: Intubation Date/Time: 04/17/2023 12:56 PM  Performed by: Allyn Kenner, CRNAPre-anesthesia Checklist: Patient identified, Emergency Drugs available, Suction available and Patient being monitored Patient Re-evaluated:Patient Re-evaluated prior to induction Oxygen Delivery Method: Circle System Utilized Preoxygenation: Pre-oxygenation with 100% oxygen Induction Type: IV induction Ventilation: Mask ventilation without difficulty Laryngoscope Size: Mac and 3 Grade View: Grade I Tube type: Oral Tube size: 8.0 mm Number of attempts: 1 Airway Equipment and Method: Stylet and Oral airway Placement Confirmation: ETT inserted through vocal cords under direct vision, positive ETCO2 and breath sounds checked- equal and bilateral Tube secured with: Tape Dental Injury: Teeth and Oropharynx as per pre-operative assessment

## 2023-04-17 NOTE — Op Note (Signed)
Bronchoscopy Procedure Note  Mario [name]  [id number]  October 14, 1950  Date:04/17/23  Time:1:23 PM   Provider Performing:Onnika Siebel C Christina Waldrop   Procedure(s):  Flexible Bronchoscopy [id number]), Endobronchial biopsy ([id number]), and Initial Therapeutic Aspiration of Tracheobronchial Tree [phone])  Indication(s) Hemoptysis  Consent Risks of the procedure as well as the alternatives and risks of each were explained to the patient and/or caregiver.  Consent for the procedure was obtained and is signed in the bedside chart  Anesthesia General   Time Out Verified patient identification, verified procedure, site/side was marked, verified correct patient position, special equipment/implants available, medications/allergies/relevant history reviewed, required imaging and test results available.   Sterile Technique Usual hand hygiene, masks, gowns, and gloves were used   Procedure Description Bronchoscope advanced through endotracheal tube and into airway.  Airways were examined down to subsegmental level with findings noted below.    Left lower lobe completely occluded by nonbloody mucus, suctioned L>R chronic bronchitis changes Focal area of ?granulation tissue oozing; this was biopsied with cryoprobe and cryoprobe then used to freeze area until no active oozing seen.  BAL then performed in same area.   Complications/Tolerance None; patient tolerated the procedure well. Chest X-ray is not needed post procedure.   EBL Minimal   Specimen(s) BAL LUL: cyto, culture Endobronchial cryobiopsy LUL  Hold further aspirin and hold AC indefinitely for now until we assure no recurrence.

## 2023-04-17 NOTE — Progress Notes (Signed)
   04/17/23 2112  BiPAP/CPAP/SIPAP  BiPAP/CPAP/SIPAP Pt Type Adult  Reason BIPAP/CPAP not in use Non-compliant   Pt refused cpap.  Pt stated the mask is too uncomfortable and does not want to wear it while here in the hospital.

## 2023-04-17 NOTE — Anesthesia Preprocedure Evaluation (Addendum)
Anesthesia Evaluation  Patient identified by MRN, date of birth, ID band Patient awake    Reviewed: Allergy & Precautions, NPO status , Patient's Chart, lab work & pertinent test results  History of Anesthesia Complications Negative for: history of anesthetic complications  Airway Mallampati: II  TM Distance: >3 FB Neck ROM: Full    Dental  (+) Poor Dentition, Missing   Pulmonary sleep apnea , COPD,  oxygen dependent, former smoker   Pulmonary exam normal        Cardiovascular hypertension, Pt. on medications + Past MI  Normal cardiovascular exam     Neuro/Psych   Anxiety Depression    negative neurological ROS     GI/Hepatic Neg liver ROS,,,GIB   Endo/Other  diabetes, Type 2, Oral Hypoglycemic Agents    Renal/GU Renal InsufficiencyRenal disease  negative genitourinary   Musculoskeletal  (+) Arthritis , Osteoarthritis,    Abdominal  (+) + obese  Peds  Hematology  (+) Blood dyscrasia (Hgb 7.7), anemia   Anesthesia Other Findings Day of surgery medications reviewed with patient.  Reproductive/Obstetrics negative OB ROS                             Anesthesia Physical Anesthesia Plan  ASA: 3  Anesthesia Plan: General   Post-op Pain Management: Minimal or no pain anticipated   Induction:   PONV Risk Score and Plan: 2 and Treatment may vary due to age or medical condition, Propofol infusion and Ondansetron  Airway Management Planned: Oral ETT  Additional Equipment: None  Intra-op Plan:   Post-operative Plan: Extubation in OR  Informed Consent:   Plan Discussed with:   Anesthesia Plan Comments:         Anesthesia Quick Evaluation

## 2023-04-17 NOTE — Progress Notes (Signed)
04/17/2023     I have seen and evaluated the patient for hemoptysis.   S:  Unchanged ongoing mild hemoptysis. Seen in endo   O: Blood pressure 96/66, pulse 64, temperature 97.6 F (36.4 C), temperature source Oral, resp. rate 17, height 5\' 3"  (1.6 m), weight 81.6 kg, SpO2 94 %.    +rhonci worse on left Transmitted upper airway sounds Aox3, fair insight  Culture neg   A:  Recurrent hemoptysis in patient with hx of L lung cancer s/p chemoradiation   P:  Diagnostic bronch today with consideration for cryo PRN if anything endobronchial found  Send at least a BAL for cyto  Eval upper airways as well    Myrla Halsted MD [facility] Prefer epic messenger for cross cover needs If after hours, please call [facility]

## 2023-04-17 NOTE — Progress Notes (Signed)
PT is unavailable at this time (Bronchoscopy at [facility]).- therefore scheduled CPT can not be completed at this time. CPT may not be needed post bronchoscopy.

## 2023-04-17 NOTE — Progress Notes (Signed)
   04/17/23 2112  Chest Physiotherapy Tx  $ Chest Physiotherapy (Vest, Bed, Metaneb)  (pt refused vest)

## 2023-04-17 NOTE — Progress Notes (Signed)
[facility] PROGRESS NOTE    Progress Note  Mario [name]  ZOX:[id number] DOB: Oct 15, 1950 DOA: 04/01/2023 PCP: [name]     Brief Narrative:   Mario [name] is an 73 y.o. 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  Came back to the ED with a white count of 13 BNP of 21 chest x-ray showed right basilar infiltrate with persistent airspace disease.  Due to ongoing shortness of breath hemoptysis a CT of the chest was done that showed large left lower lobe infiltrate some pulmonary fibrosis mild loculated fluid collection pulmonary and critical care was consulted and related that the limited endobronchial due to his oxygen requirement.  He was continued on empiric antibiotics and diuretics.  He continues to have hemoptysis despite 1 week of anticoagulation interruption.   Assessment/Plan:   Acute respiratory failure with hypoxia in a patient with COPD on 5 L of oxygen dependent with a recent pneumonia and RSV infection readmitted and found to have hemoptysis while on DOAC: Continues to have hemoptysis. Currently on 4 L of oxygen. Continue taper steroids. He was continue on inhalers and steroid taper. Will need complete antibiotics for 7 days. Continue incentive spirometry and flutter valve out of bed to chair. For therapy evaluated the patient no home health PT. For possible bronchoscopy on 04/17/2023.  History of PE: Was on Eliquis prior to admission discontinued due to hemoptysis. Will need to be off Eliquis indefinitely. CT angio of the chest on 03/17/2023 was negative for PE.  History of lung cancer: Status post surgery.  Diabetes mellitus type 2: He is on low-dose steroids which will  make his blood glucose erratic, continue sliding scale insulin, add meal coverage.  Essential hypertension: Continue diltiazem.  BPH: Continue Flomax.  General anxiety disorder: Continue bupropion, BuSpar, Cymbalta, Trileptal and trazodone.  Hypovolemic hyponatremia: Low today likely due to diuretic challenge According to chart he is negative about 11 L. His weight is only down by 1 kg.  Sepsis sleep apnea: Continue CPAP at night.  Constipation: Continue MiraLAX.  Morbid obesity: Noted.   DVT prophylaxis: lovenox Family Communication:none Status is: Inpatient Remains inpatient appropriate because: Hemoptysis    Code Status:     Code Status Orders  (From admission, onward)           Start     Ordered   04/01/23 2031  Full code  Continuous       Question:  By:  Answer:  Consent: discussion documented in EHR   04/01/23 2032           Code Status History     Date Active Date Inactive Code Status Order ID Comments User Context   03/17/2023 0419 04/01/2023 1712 Full Code [id number]  Tereasa Coop, MD ED   10/20/2022 0511 10/21/2022 2108 Full Code [id number]  Gery Pray, MD ED   10/08/2022 0430 10/09/2022 1857 Full Code [id number]  Glynn Octave, MD ED   09/25/2022 1202 10/01/2022 1702 Full Code [id number]  Bobette Mo, MD ED   07/02/2022 1956 07/05/2022 1810 Full Code [id number]  Orland Mustard, MD ED   11/03/2021 0057 11/04/2021 1755 Full Code [id number]  Briscoe Deutscher, MD ED   10/08/2021 1333 10/09/2021 1719 Full  Code [id number]  Clent Ridges ED   10/01/2021 1932 10/04/2021 1934 Full Code [id number]  Franne Forts, DO ED   09/30/2021 2345 10/01/2021 1822 Full Code [id number]  Nira Conn, MD ED   09/23/2021 [phone] 09/26/2021 2237 DNR [id number]  Marinda Elk, MD ED   08/25/2021 1020 09/07/2021 1653 DNR [id number]  Joylene Draft, NP Inpatient   08/24/2021 0402 08/25/2021 1020 Full Code [id number]  Briscoe Deutscher, MD ED   08/21/2021 0031  08/23/2021 1845 Full Code [id number]  Briscoe Deutscher, MD ED   08/07/2021 0956 08/11/2021 0427 Full Code [id number]  Drema Dallas, MD ED   08/03/2021 1851 08/06/2021 0348 Full Code [id number]  Joseph Art, DO Inpatient   06/17/2021 0046 07/06/2021 1726 Full Code [id number]  Benita Gutter T, DO ED   06/04/2021 0139 06/11/2021 1609 Full Code [id number]  Howerter, Chaney Born, DO ED   05/21/2021 2320 05/25/2021 0549 Full Code [id number]  Rehman, Areeg N, DO ED   05/20/2021 0150 05/21/2021 2025 Full Code [id number]  Rehman, Areeg N, DO ED   02/04/2021 0105 02/04/2021 2154 Full Code [id number]  Charlsie Quest, MD ED   01/09/2021 0612 01/10/2021 2012 Full Code [id number]  John Giovanni, MD Inpatient   09/04/2020 0923 09/05/2020 1724 Full Code [id number]  Teddy Spike, DO ED   07/26/2020 1449 08/05/2020 1913 Full Code [id number]  Teddy Spike, DO Inpatient   04/04/2020 0033 04/07/2020 2141 Full Code [id number]  Eduard Clos, MD ED   01/04/2020 1615 01/06/2020 1904 Full Code [id number]  Teddy Spike, DO Inpatient   12/26/2019 1039 12/28/2019 1810 Full Code [id number]  Jonah Blue, MD ED   07/12/2019 0557 07/15/2019 1904 Full Code [id number]  Marily Memos, MD ED   07/03/2019 0016 07/03/2019 2011 Full Code [id number]  Bobette Mo, MD ED   07/03/2019 0016 07/03/2019 0016 Full Code [id number]  Bobette Mo, MD ED   06/23/2019 0425 06/25/2019 2122 Full Code [id number]  Thalia Party, DO ED   10/10/2018 2021 10/13/2018 1641 Full Code [id number]  Ward, Chase Picket, PA-C ED   04/30/2018 1759 05/01/2018 1714 Full Code [id number]  Dietrich Pates, PA-C ED   02/20/2018 1759 02/21/2018 1452 Full Code [id number]  Sabas Sous, MD ED   07/16/2017 0309 07/21/2017 1734 Full Code [id number]  Hillary Bow, DO ED   09/17/2016 2107 09/18/2016 2055 Full Code [id number]  Freddrick March, MD ED   06/27/2016 1523 07/07/2016 1718 Full Code [id number]  Ardelle Balls, PA-C ED   05/23/2014 2356 05/26/2014 1656 Full Code [id number]  Pearson Grippe, MD  Inpatient   01/21/2013 2202 01/23/2013 2202 Full Code [id number]  Angus Seller PA-C ED   01/12/2013 0020 01/12/2013 0641 Full Code [id number]  Arthor Captain, PA-C ED   01/10/2013 0122 01/11/2013 2122 Full Code [id number]  Eduard Clos, MD Inpatient         IV Access:   Peripheral IV   Procedures and diagnostic studies:   No results found.   Medical Consultants:   None.   Subjective:    Mario [name] continues to have hemoptysis  Objective:    Vitals:   04/16/23 2122 04/16/23 2127 04/17/23 0500 04/17/23 0540  BP: 138/88   123/71  Pulse: 81   64  Resp: 20   20  Temp: 97.9 F (36.6 C)   98 F (36.7 C)  TempSrc: Oral   Oral  SpO2: 93% 96%  96%  Weight:   (!) 139.2 kg   Height:       SpO2: 96 % O2 Flow Rate (L/min): 4 L/min FiO2 (%): 36 %   Intake/Output Summary (Last 24 hours) at 04/17/2023 0708 Last data filed at 04/17/2023 0530 Gross per 24 hour  Intake 2422.2 ml  Output 3250 ml  Net -827.8 ml   Filed Weights   04/15/23 0500 04/16/23 0527 04/17/23 0500  Weight: 135.2 kg (!) 139 kg (!) 139.2 kg    Exam: General exam: In no acute distress. Respiratory system: Good air movement and clear to auscultation. Cardiovascular system: S1 & S2 heard, RRR. No JVD. Gastrointestinal system: Abdomen is nondistended, soft and nontender.  Extremities: No pedal edema. Skin: No rashes, lesions or ulcers Psychiatry: Judgement and insight appear normal. Mood & affect appropriate.  Data Reviewed:    Labs: Basic Metabolic Panel: Recent Labs  Lab 04/13/23 0506 04/14/23 0507 04/15/23 0523 04/16/23 0451 04/17/23 0509  NA 137 132* 134* 132* 131*  K 3.7 3.8 4.8 3.9 3.9  CL 97* 96* 101 98 97*  CO2 29 25 25 27 26   GLUCOSE 145* 145* 180* 155* 152*  BUN 23 25* 19 18 17   CREATININE 0.82 0.96 0.86 0.78 0.83  CALCIUM 8.9 8.4* 8.4* 8.3* 8.3*  MG  --  2.3 2.1 2.1 2.0   GFR Estimated Creatinine Clearance: 121.2 mL/min (by C-G formula based on SCr of 0.83  mg/dL). Liver Function Tests: No results for input(s): "AST", "ALT", "ALKPHOS", "BILITOT", "PROT", "ALBUMIN" in the last 168 hours. No results for input(s): "LIPASE", "AMYLASE" in the last 168 hours. No results for input(s): "AMMONIA" in the last 168 hours. Coagulation profile No results for input(s): "INR", "PROTIME" in the last 168 hours. COVID-19 Labs  No results for input(s): "DDIMER", "FERRITIN", "LDH", "CRP" in the last 72 hours.  Lab Results  Component Value Date   SARSCOV2NAA NEGATIVE 03/17/2023   SARSCOV2NAA NEGATIVE 10/29/2022   SARSCOV2NAA NEGATIVE 10/19/2022   SARSCOV2NAA POSITIVE (A) 09/25/2022    CBC: Recent Labs  Lab 04/13/23 0506  WBC 12.1*  HGB 13.6  HCT 42.7  MCV 94.3  PLT 306   Cardiac Enzymes: No results for input(s): "CKTOTAL", "CKMB", "CKMBINDEX", "TROPONINI" in the last 168 hours. BNP (last 3 results) No results for input(s): "PROBNP" in the last 8760 hours. CBG: Recent Labs  Lab 04/15/23 2209 04/16/23 0747 04/16/23 1141 04/16/23 1617 04/16/23 2119  GLUCAP 176* 145* 140* 263* 151*   D-Dimer: No results for input(s): "DDIMER" in the last 72 hours. Hgb A1c: No results for input(s): "HGBA1C" in the last 72 hours. Lipid Profile: No results for input(s): "CHOL", "HDL", "LDLCALC", "TRIG", "CHOLHDL", "LDLDIRECT" in the last 72 hours. Thyroid function studies: No results for input(s): "TSH", "T4TOTAL", "T3FREE", "THYROIDAB" in the last 72 hours.  Invalid input(s): "FREET3" Anemia work up: No results for input(s): "VITAMINB12", "FOLATE", "FERRITIN", "TIBC", "IRON", "RETICCTPCT" in the last 72 hours. Sepsis Labs: Recent Labs  Lab 04/13/23 0506  WBC 12.1*   Microbiology Recent Results (from the past 240 hours)  Expectorated Sputum Assessment w Gram Stain, Rflx to Resp Cult     Status: None   Collection Time: 04/12/23  3:47 AM   Specimen: Expectorated Sputum  Result Value Ref Range Status   Specimen Description EXPECTORATED SPUTUM  Final    Special Requests NONE  Final   Sputum evaluation   Final    THIS SPECIMEN IS ACCEPTABLE FOR SPUTUM CULTURE Performed at [facility], [address]  [place], [place] [address]    Report Status 04/12/2023 FINAL  Final  Culture, Respiratory w Gram Stain     Status: None   Collection Time: 04/12/23  3:47 AM  Result Value Ref Range Status   Specimen Description   Final    EXPECTORATED SPUTUM Performed at [facility], [address], [place], [place] [address]    Special Requests   Final    NONE Reflexed from [id number] Performed at [facility], [address], [place], [place] [address]    Gram Stain NO WBC SEEN RARE GRAM POSITIVE COCCI   Final   Culture   Final    Normal respiratory flora-no Staph aureus or Pseudomonas seen Performed at [facility], [address], [place], [place] [address]    Report Status 04/14/2023 FINAL  Final     Medications:    arformoterol  15 mcg Nebulization BID   ascorbic acid  500 mg Oral Daily   aspirin EC  81 mg Oral Daily   azelastine  2 spray Each Nare BID   budesonide (PULMICORT) nebulizer solution  0.25 mg Nebulization BID   buPROPion  300 mg Oral q morning   busPIRone  15 mg Oral Daily   cholecalciferol  1,000 Units Oral Daily   diltiazem  180 mg Oral Daily   docusate sodium  200 mg Oral BID   DULoxetine  60 mg Oral BID   fluticasone  2 spray Each Nare Daily   folic acid  1 mg Oral Daily   gabapentin  100 mg Oral TID   guaiFENesin  1,200 mg Oral BID   insulin aspart  0-5 Units Subcutaneous QHS   insulin aspart  0-9 Units Subcutaneous TID WC   insulin aspart  3 Units Subcutaneous TID WC   insulin glargine-yfgn  5 Units Subcutaneous BID   magnesium oxide  200 mg Oral Daily   melatonin  3 mg Oral QHS   multivitamin with minerals  1 tablet Oral Daily   Oxcarbazepine  600 mg Oral TID   pantoprazole  40 mg Oral BID   polyethylene glycol  17 g Oral TID   predniSONE  20 mg  Oral Q breakfast   revefenacin  175 mcg Nebulization Daily   sodium chloride  2 spray Each Nare BID   tamsulosin  0.8 mg Oral Daily   traZODone  75 mg Oral QHS   Continuous Infusions:  piperacillin-tazobactam (ZOSYN)  IV 3.375 g (04/17/23 0508)   vancomycin 1,250 mg (04/17/23 0154)      LOS: 13 days   Marinda Elk  [facility]  04/17/2023, 7:08 AM

## 2023-04-17 NOTE — Plan of Care (Signed)
  Problem: Pain Managment: Goal: General experience of comfort will improve and/or be controlled Outcome: Progressing   Problem: Safety: Goal: Ability to remain free from injury will improve Outcome: Progressing

## 2023-04-18 ENCOUNTER — Encounter (HOSPITAL_COMMUNITY): Payer: Self-pay | Admitting: Internal Medicine

## 2023-04-18 LAB — GLUCOSE, CAPILLARY
Glucose-Capillary: 150 mg/dL — ABNORMAL HIGH (ref 70–99)
Glucose-Capillary: 130 mg/dL — ABNORMAL HIGH (ref 70–99)
Glucose-Capillary: 131 mg/dL — ABNORMAL HIGH (ref 70–99)
Glucose-Capillary: 209 mg/dL — ABNORMAL HIGH (ref 70–99)
Glucose-Capillary: 112 mg/dL — ABNORMAL HIGH (ref 70–99)

## 2023-04-18 LAB — BASIC METABOLIC PANEL
Anion gap: 8 (ref 5–15)
BUN: 20 mg/dL (ref 8–23)
CO2: 25 mmol/L (ref 22–32)
Calcium: 8.2 mg/dL — ABNORMAL LOW (ref 8.9–10.3)
Chloride: 100 mmol/L (ref 98–111)
Creatinine, Ser: 0.89 mg/dL (ref 0.61–1.24)
GFR, Estimated: >60 mL/min (ref >60)
Glucose, Bld: 93 mg/dL (ref 70–99)
Potassium: 3.7 mmol/L (ref 3.5–5.1)
Sodium: 133 mmol/L — ABNORMAL LOW (ref 135–145)

## 2023-04-18 LAB — MAGNESIUM: Magnesium: 2.1 mg/dL (ref 1.7–2.4)

## 2023-04-18 MED ORDER — TRANEXAMIC ACID FOR INHALATION
500.0000 mg | Freq: Three times a day (TID) | RESPIRATORY_TRACT | Status: AC
  Administered 2023-04-18 (×2): 500 mg via RESPIRATORY_TRACT
  Filled 2023-04-18: qty 5

## 2023-04-18 NOTE — Progress Notes (Signed)
[facility] PROGRESS NOTE    Progress Note  Mario [name]  ZOX:[id number] DOB: 09/09/1950 DOA: 04/01/2023 PCP: [name]     Brief Narrative:   Mario SCHWINN is an 73 y.o. 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  Came back to the ED with a white count of 13 BNP of 21 chest x-ray showed right basilar infiltrate with persistent airspace disease.  Due to ongoing shortness of breath hemoptysis a CT of the chest was done that showed large left lower lobe infiltrate some pulmonary fibrosis mild loculated fluid collection pulmonary and critical care was consulted and related that the limited endobronchial due to his oxygen requirement.  He was continued on empiric antibiotics and diuretics.  He continues to have hemoptysis despite 1 week of anticoagulation interruption.   Assessment/Plan:   Acute respiratory failure with hypoxia in a patient with COPD on 5 L of oxygen dependent with a recent pneumonia and RSV infection readmitted and found to have hemoptysis while on DOAC: Continues to have hemoptysis. Currently on 4 L of oxygen. Continue taper steroids. He was continue on inhalers and steroid taper. Will need complete antibiotics for 7 days. Continue incentive spirometry and flutter valve out of bed to chair. For therapy evaluated the patient no home health PT. Status post bronchoscopy on 04/17/2023 showed focal area of granulation tissue that was biopsy as it was oozing cauterized BAL performed. Further recommendations per pulmonary  History of PE: Was on Eliquis prior to admission discontinued due to hemoptysis. Will need to be off Eliquis indefinitely. CT angio of the chest on 03/17/2023 was  negative for PE.  History of lung cancer: Status post surgery.  Diabetes mellitus type 2: He is on low-dose steroids which will make his blood glucose erratic, continue sliding scale insulin,.  Essential hypertension: Continue diltiazem.  BPH: Continue Flomax.  General anxiety disorder: Continue bupropion, BuSpar, Cymbalta, Trileptal and trazodone.  Hypovolemic hyponatremia: Low today likely due to diuretic challenge According to chart he is negative about 11 L. His weight is only down by 1 kg.  Sepsis sleep apnea: Continue CPAP at night.  Constipation: Continue MiraLAX.  Morbid obesity: Noted.   DVT prophylaxis: lovenox Family Communication:none Status is: Inpatient Remains inpatient appropriate because: Hemoptysis    Code Status:     Code Status Orders  (From admission, onward)           Start     Ordered   04/01/23 2031  Full code  Continuous       Question:  By:  Answer:  Consent: discussion documented in EHR   04/01/23 2032           Code Status History     Date Active Date Inactive Code Status Order ID Comments User Context   03/17/2023 0419 04/01/2023 1712 Full Code [id number]  Tereasa Coop, MD ED   10/20/2022 0511 10/21/2022 2108 Full Code [id number]  Gery Pray, MD ED   10/08/2022 0430 10/09/2022 1857 Full Code [id number]  Glynn Octave, MD ED   09/25/2022 1202 10/01/2022 1702 Full Code [id number]  Bobette Mo, MD ED   07/02/2022 1956 07/05/2022 1810 Full Code [id number]  Orland Mustard, MD ED   11/03/2021 [phone] 11/04/2021  1755 Full Code [id number]  Briscoe Deutscher, MD ED   10/08/2021 1333 10/09/2021 1719 Full Code [id number]  Al Decant, PA-C ED   10/01/2021 1932 10/04/2021 1934 Full Code [id number]  Franne Forts, DO ED   09/30/2021 2345 10/01/2021 1822 Full Code [id number]  Nira Conn, MD ED   09/23/2021 0029 09/26/2021 2237 DNR [id number]  Marinda Elk, MD ED   08/25/2021 1020 09/07/2021 1653 DNR [id number]  Joylene Draft,  NP Inpatient   08/24/2021 0402 08/25/2021 1020 Full Code [id number]  Briscoe Deutscher, MD ED   08/21/2021 0031 08/23/2021 1845 Full Code [id number]  Briscoe Deutscher, MD ED   08/07/2021 0956 08/11/2021 0427 Full Code [id number]  Drema Dallas, MD ED   08/03/2021 1851 08/06/2021 0348 Full Code [id number]  Joseph Art, DO Inpatient   06/17/2021 0046 07/06/2021 1726 Full Code [id number]  Benita Gutter T, DO ED   06/04/2021 0139 06/11/2021 1609 Full Code [id number]  Howerter, Chaney Born, DO ED   05/21/2021 2320 05/25/2021 0549 Full Code [id number]  Rehman, Areeg N, DO ED   05/20/2021 0150 05/21/2021 2025 Full Code [id number]  Rehman, Areeg N, DO ED   02/04/2021 0105 02/04/2021 2154 Full Code [id number]  Charlsie Quest, MD ED   01/09/2021 0612 01/10/2021 2012 Full Code [id number]  John Giovanni, MD Inpatient   09/04/2020 0923 09/05/2020 1724 Full Code [id number]  Teddy Spike, DO ED   07/26/2020 1449 08/05/2020 1913 Full Code [id number]  Teddy Spike, DO Inpatient   04/04/2020 0033 04/07/2020 2141 Full Code [id number]  Eduard Clos, MD ED   01/04/2020 1615 01/06/2020 1904 Full Code [id number]  Teddy Spike, DO Inpatient   12/26/2019 1039 12/28/2019 1810 Full Code [id number]  Jonah Blue, MD ED   07/12/2019 0557 07/15/2019 1904 Full Code [id number]  Marily Memos, MD ED   07/03/2019 0016 07/03/2019 2011 Full Code [id number]  Bobette Mo, MD ED   07/03/2019 0016 07/03/2019 0016 Full Code [id number]  Bobette Mo, MD ED   06/23/2019 0425 06/25/2019 2122 Full Code [id number]  Thalia Party, DO ED   10/10/2018 2021 10/13/2018 1641 Full Code [id number]  Ward, Chase Picket, PA-C ED   04/30/2018 1759 05/01/2018 1714 Full Code [id number]  Dietrich Pates, PA-C ED   02/20/2018 1759 02/21/2018 1452 Full Code [id number]  Sabas Sous, MD ED   07/16/2017 0309 07/21/2017 1734 Full Code [id number]  Hillary Bow, DO ED   09/17/2016 2107 09/18/2016 2055 Full Code [id number]  Freddrick March, MD ED   06/27/2016 1523 07/07/2016 1718 Full Code [id number]   Ardelle Balls, PA-C ED   05/23/2014 2356 05/26/2014 1656 Full Code [id number]  Pearson Grippe, MD Inpatient   01/21/2013 2202 01/23/2013 2202 Full Code [id number]  Angus Seller PA-C ED   01/12/2013 0020 01/12/2013 0641 Full Code [id number]  Arthor Captain, PA-C ED   01/10/2013 0122 01/11/2013 2122 Full Code [id number]  Eduard Clos, MD Inpatient         IV Access:   Peripheral IV   Procedures and diagnostic studies:   No results found.   Medical Consultants:   None.   Subjective:    MEKEL HAVERSTOCK continues to have hemoptysis  Objective:    Vitals:   04/17/23 2108 04/17/23 2130 04/18/23 0500 04/18/23 0552  BP:  135/73  136/70  Pulse:  94  78  Resp:  19  18  Temp:  98 F (36.7 C)  98.1 F (36.7 C)  TempSrc:  Oral  Oral  SpO2: 92% 92%  98%  Weight:   (!) 138 kg   Height:       SpO2: 98 % O2 Flow Rate (L/min): 5 L/min (pt found on 4l with spo2 88%, increased to 5L) FiO2 (%): 36 %   Intake/Output Summary (Last 24 hours) at 04/18/2023 0719 Last data filed at 04/18/2023 0630 Gross per 24 hour  Intake 1475.99 ml  Output 2700 ml  Net -1224.01 ml   Filed Weights   04/16/23 0527 04/17/23 0500 04/18/23 0500  Weight: (!) 139 kg (!) 139.2 kg (!) 138 kg    Exam: General exam: In no acute distress. Respiratory system: Good air movement and clear to auscultation. Cardiovascular system: S1 & S2 heard, RRR. No JVD. Gastrointestinal system: Abdomen is nondistended, soft and nontender.  Extremities: No pedal edema. Skin: No rashes, lesions or ulcers Psychiatry: Judgement and insight appear normal. Mood & affect appropriate.  Data Reviewed:    Labs: Basic Metabolic Panel: Recent Labs  Lab 04/14/23 0507 04/15/23 0523 04/16/23 0451 04/17/23 0509 04/18/23 0458  NA 132* 134* 132* 131* 133*  K 3.8 4.8 3.9 3.9 3.7  CL 96* 101 98 97* 100  CO2 25 25 27 26 25   GLUCOSE 145* 180* 155* 152* 93  BUN 25* 19 18 17 20   CREATININE 0.96 0.86 0.78 0.83 0.89   CALCIUM 8.4* 8.4* 8.3* 8.3* 8.2*  MG 2.3 2.1 2.1 2.0 2.1   GFR Estimated Creatinine Clearance: 112.5 mL/min (by C-G formula based on SCr of 0.89 mg/dL). Liver Function Tests: No results for input(s): "AST", "ALT", "ALKPHOS", "BILITOT", "PROT", "ALBUMIN" in the last 168 hours. No results for input(s): "LIPASE", "AMYLASE" in the last 168 hours. No results for input(s): "AMMONIA" in the last 168 hours. Coagulation profile No results for input(s): "INR", "PROTIME" in the last 168 hours. COVID-19 Labs  No results for input(s): "DDIMER", "FERRITIN", "LDH", "CRP" in the last 72 hours.  Lab Results  Component Value Date   SARSCOV2NAA NEGATIVE 03/17/2023   SARSCOV2NAA NEGATIVE 10/29/2022   SARSCOV2NAA NEGATIVE 10/19/2022   SARSCOV2NAA POSITIVE (A) 09/25/2022    CBC: Recent Labs  Lab 04/13/23 0506  WBC 12.1*  HGB 13.6  HCT 42.7  MCV 94.3  PLT 306   Cardiac Enzymes: No results for input(s): "CKTOTAL", "CKMB", "CKMBINDEX", "TROPONINI" in the last 168 hours. BNP (last 3 results) No results for input(s): "PROBNP" in the last 8760 hours. CBG: Recent Labs  Lab 04/17/23 1220 04/17/23 1619 04/17/23 1822 04/17/23 2128 04/18/23 0115  GLUCAP 133* 151* 167* 186* 150*   D-Dimer: No results for input(s): "DDIMER" in the last 72 hours. Hgb A1c: No results for input(s): "HGBA1C" in the last 72 hours. Lipid Profile: No results for input(s): "CHOL", "HDL", "LDLCALC", "TRIG", "CHOLHDL", "LDLDIRECT" in the last 72 hours. Thyroid function studies: No results for input(s): "TSH", "T4TOTAL", "T3FREE", "THYROIDAB" in the last 72 hours.  Invalid input(s): "FREET3" Anemia work up: No results for input(s): "VITAMINB12", "FOLATE", "FERRITIN", "TIBC", "IRON", "RETICCTPCT" in the last 72 hours. Sepsis Labs: Recent Labs  Lab 04/13/23 0506  WBC 12.1*   Microbiology Recent Results (from the past 240 hours)  Expectorated Sputum Assessment w Gram Stain, Rflx to Resp Cult     Status: None    Collection Time: 04/12/23  3:47 AM   Specimen: Expectorated Sputum  Result Value Ref Range Status   Specimen Description EXPECTORATED SPUTUM  Final   Special Requests NONE  Final  Sputum evaluation   Final    THIS SPECIMEN IS ACCEPTABLE FOR SPUTUM CULTURE Performed at [facility], [address], [place], [place] [address]    Report Status 04/12/2023 FINAL  Final  Culture, Respiratory w Gram Stain     Status: None   Collection Time: 04/12/23  3:47 AM  Result Value Ref Range Status   Specimen Description   Final    EXPECTORATED SPUTUM Performed at [facility], [address] [place], [place] [address]    Special Requests   Final    NONE Reflexed from [id number] Performed at [facility], [address] [place], [place] [address]    Gram Stain NO WBC SEEN RARE GRAM POSITIVE COCCI   Final   Culture   Final    Normal respiratory flora-no Staph aureus or Pseudomonas seen Performed at [facility], [address], [place], [place] [address]    Report Status 04/14/2023 FINAL  Final  Culture, BAL-quantitative w Gram Stain     Status: None (Preliminary result)   Collection Time: 04/17/23  1:11 PM   Specimen: Bronchial Alveolar Lavage; Respiratory  Result Value Ref Range Status   Specimen Description BRONCHIAL ALVEOLAR LAVAGE  Final   Special Requests LUL  Final   Gram Stain   Final    RARE WBC PRESENT, PREDOMINANTLY PMN NO ORGANISMS SEEN Performed at [facility], [address] [place], [place] [address]    Culture PENDING  Incomplete   Report Status PENDING  Incomplete  Aerobic/Anaerobic Culture w Gram Stain (surgical/deep wound)     Status: None (Preliminary result)   Collection Time: 04/17/23  1:11 PM   Specimen: Bronchial Alveolar Lavage; Respiratory  Result Value Ref Range Status   Specimen Description BRONCHIAL ALVEOLAR LAVAGE  Final   Special Requests LUL  Final   Gram Stain   Final    RARE WBC  PRESENT, PREDOMINANTLY PMN NO ORGANISMS SEEN    Culture   Final    NO GROWTH < 24 HOURS Performed at [facility], [address], [place], [place] [address]    Report Status PENDING  Incomplete     Medications:    arformoterol  15 mcg Nebulization BID   ascorbic acid  500 mg Oral Daily   azelastine  2 spray Each Nare BID   budesonide (PULMICORT) nebulizer solution  0.25 mg Nebulization BID   buPROPion  300 mg Oral q morning   busPIRone  15 mg Oral Daily   cholecalciferol  1,000 Units Oral Daily   diltiazem  180 mg Oral Daily   docusate sodium  200 mg Oral BID   DULoxetine  60 mg Oral BID   fluticasone  2 spray Each Nare Daily   folic acid  1 mg Oral Daily   gabapentin  100 mg Oral TID   guaiFENesin  1,200 mg Oral BID   insulin aspart  0-5 Units Subcutaneous QHS   insulin aspart  0-9 Units Subcutaneous TID WC   insulin aspart  3 Units Subcutaneous TID WC   insulin glargine-yfgn  5 Units Subcutaneous BID   magnesium oxide  200 mg Oral Daily   melatonin  3 mg Oral QHS   multivitamin with minerals  1 tablet Oral Daily   Oxcarbazepine  600 mg Oral TID   pantoprazole  40 mg Oral BID   polyethylene glycol  17 g Oral TID   predniSONE  10 mg Oral Q breakfast  revefenacin  175 mcg Nebulization Daily   sodium chloride  2 spray Each Nare BID   tamsulosin  0.8 mg Oral Daily   traZODone  75 mg Oral QHS   Continuous Infusions:  piperacillin-tazobactam (ZOSYN)  IV 3.375 g (04/18/23 0531)   vancomycin 1,250 mg (04/18/23 0217)      LOS: 14 days   Marinda Elk  [facility]  04/18/2023, 7:19 AM

## 2023-04-18 NOTE — Progress Notes (Signed)
Pt requesting more ice cream- chocolate, when NT has given him chocolate ice cream and he has had chocolate pudding tonight. Nurse shared that she wasn't giving ice cream, as it tends to raise blood sugar levels. He then stated "everyone else has been doing it". Nurse explained how this doesn't help promote healthy blood sugar levels for him. Nurse explained to him that his current blood sugar level is 186 and explained what medications he has received to help treat diabetes. Pt then went on to say that he doesn't have diabetes. Nurse offered sugar free/low car items for snack. Pt refused.

## 2023-04-18 NOTE — TOC Progression Note (Signed)
Transition of Care [facility]) - Progression Note    Patient Details  Name: Mario [name] MRN: [id number] Date of Birth: [birth year]  [facility]) CM/SW Contact  Amada Jupiter, LCSW Phone Number: 04/18/2023, 2:39 PM  Clinical Narrative:     Spoke with pt to let him know TOC/ CSW continuing to follow along for any dc needs.  Per MD, pt not quite medically ready for dc.  Alerted by RN that [employer] is agency providing home aide services - contact is Delton Coombes at [phone].  He asks that we let him know when pt is discharging so he can make sure to start back services.        Expected Discharge Plan and Services                                               Social Determinants of Health (SDOH) Interventions SDOH Screenings   Food Insecurity: No Food Insecurity (04/02/2023)  Housing: Low Risk  (04/02/2023)  Transportation Needs: No Transportation Needs (04/02/2023)  Utilities: Not At Risk (04/02/2023)  Financial Resource Strain: Low Risk  (09/23/2020)  Social Connections: Socially Isolated (04/02/2023)  Stress: No Stress Concern Present (09/23/2020)  Tobacco Use: Medium Risk (04/01/2023)    Readmission Risk Interventions    03/25/2023    2:23 PM 11/03/2021   11:50 AM 08/23/2021   11:48 AM  Readmission Risk Prevention Plan  Transportation Screening Complete Complete Complete  Medication Review Oceanographer) Complete Complete Complete  PCP or Specialist appointment within 3-5 days of discharge  Complete Complete  HRI or Home Care Consult Complete Complete Complete  SW Recovery Care/Counseling Consult Complete Complete Complete  Palliative Care Screening Not Applicable Not Applicable Not Applicable  Skilled Nursing Facility Not Applicable Not Applicable Not Applicable

## 2023-04-18 NOTE — Plan of Care (Signed)
   Problem: Education: Goal: Individualized Educational Video(s) Outcome: Progressing   Problem: Coping: Goal: Ability to adjust to condition or change in health will improve Outcome: Progressing

## 2023-04-18 NOTE — Progress Notes (Addendum)
   NAME:  Mario [name], MRN:  [id number], DOB:  Jul 09, 1950, LOS: 14 ADMISSION DATE:  04/01/2023, CONSULTATION DATE:  2/3 REFERRING MD:  KC, CHIEF COMPLAINT:  hemoptysis    History of Present Illness:  73 year old male w/ chronic resp failure in setting of COPD (5lpm baseline) remotely seen by I[facility] (not seen in office > 2 yrs). Admitted from 1/17 to 2/1 w/ what AECOPD in setting of RSV infection and prob CAP after treated w/ ABX and systemic steroids.. was sent home on 5 liters rest and instructed to increase to 6 lpm w/ activity. Apparently when home rested then got up to move, walked up flight of stairs, sats 80s, marked dyspnea, HR 130s. Reported back to the ER that evening (same day of discharge)  In ER  Wbc 12.4, BNP 29/ CXR w/ right basilar band-like atx, and persistent left sided airspace disease.  Continued on oxygen, scheduled BDs ordered, got IV steroids followed by orals.  On 2/1 started having episodes of hemoptysis.  This has been fairly persistent.  It has been bright red, about a teaspoon in size happening fairly frequently at least once every 2 hours and has persisted over night into 2/3 he also continues to have resting dyspnea and thus PCCM asked to eval  Pertinent  Medical History  Chronic resp failure 5 lpm baseline, remote PE (on DOAC), lung cancer s/p XRT, type II DM, HTN, BPH, GAD.grade I diastolic dysfxn ef 60-65% Significant Hospital Events: Including procedures, antibiotic start and stop dates in addition to other pertinent events   2/1 discharged and readmitted following treatment for RSV infection possible And AECOPD placed back on steroids and nebulizers 2/3 pulm consulted for shortness of breath and hemoptysis 2/17 bronch cryo LUL take off granulomatous appearing tissue, biopsied  Interim History / Subjective:  Still with some hemoptysis Sometimes brown sometimes bright red  Objective   Blood pressure 132/72, pulse 72, temperature 98.2 F (36.8 C), temperature  source Oral, resp. rate 20, height 6\' 2"  (1.88 m), weight (!) 138 kg, SpO2 96%.        Intake/Output Summary (Last 24 hours) at 04/18/2023 1508 Last data filed at 04/18/2023 1324 Gross per 24 hour  Intake 2042.17 ml  Output 1600 ml  Net 442.17 ml   Filed Weights   04/16/23 0527 04/17/23 0500 04/18/23 0500  Weight: (!) 139 kg (!) 139.2 kg (!) 138 kg   Physical Exam: In no apparent distress Moist oral mucosa Clear but decreased breath sounds S1-S2 appreciated Bowel sounds appreciated Alert and oriented x 3  Personally reviewed CT scan of the chest -Multilobar infiltrate left lower lobe  Resolved Hospital Problem list     Assessment & Plan:   Hemoptysis persists:  -Amount is still minimal but consistent -Has been on antibiotics (completed course Unasyn), steroids, off anticoagulation -No improvement with many days of TXA nebs, 3 more TXA nebs ordered -Day 6 of vancomycin and cefepime -s/p bronchoscopy with cryo and biopsy of what looks like granulation tissue on images -IR was consulted, no option  for embolization  -discontinue prednisone  PCCM will sign off.  Karren Burly, MD  PCCM Pager: See Loretha Stapler

## 2023-04-18 NOTE — Progress Notes (Signed)
Mobility Specialist - Progress Note   04/18/23 1212  Mobility  Activity Ambulated with assistance in hallway  Level of Assistance Modified independent, requires aide device or extra time  Assistive Device None  Distance Ambulated (ft) 90 ft  Range of Motion/Exercises Active  Activity Response Tolerated well  Mobility Referral Yes  Mobility visit 1 Mobility  Mobility Specialist Start Time (ACUTE ONLY) 1201  Mobility Specialist Stop Time (ACUTE ONLY) 1212  Mobility Specialist Time Calculation (min) (ACUTE ONLY) 11 min   Received in bed and agreed to mobility. On 6L for ambulation as tank does not do 5L. 95% throughout session, returned to bed with all needs met. Back on 5L.  Marilynne Halsted Mobility Specialist

## 2023-04-18 NOTE — Progress Notes (Signed)
Pt requesting that his blood sugar be checked again. He stated it is because he can't go  to sleep and that he feels it has lowered some. Blood sugar checked-150. Pt remains warm, dry, no visible distress. In bed resting.

## 2023-04-18 NOTE — Progress Notes (Signed)
Pt refused nebs and chest vest at this time, stated that his ribs hurt, contraindication to chest vest.

## 2023-04-18 NOTE — Anesthesia Postprocedure Evaluation (Signed)
Anesthesia Post Note  Patient: Mario [name]  Procedure(s) Performed: VIDEO BRONCHOSCOPY WITHOUT FLUORO (Left) CRYOTHERAPY BRONCHIAL WASHINGS HEMOSTASIS CONTROL     Patient location during evaluation: PACU Anesthesia Type: General Level of consciousness: awake and alert Pain management: pain level controlled Vital Signs Assessment: post-procedure vital signs reviewed and stable Respiratory status: spontaneous breathing, nonlabored ventilation and respiratory function stable Cardiovascular status: blood pressure returned to baseline and stable Postop Assessment: no apparent nausea or vomiting Anesthetic complications: no   There were no known notable events for this encounter.  Last Vitals:  Vitals:   04/17/23 2130 04/18/23 0552  BP: 135/73 136/70  Pulse: 94 78  Resp: 19 18  Temp: 36.7 C 36.7 C  SpO2: 92% 98%    Last Pain:  Vitals:   04/18/23 0552  TempSrc: Oral  PainSc:                  Lowella Curb

## 2023-04-19 DIAGNOSIS — R040 Epistaxis: Secondary | ICD-10-CM

## 2023-04-19 LAB — CBC WITH DIFFERENTIAL/PLATELET
Abs Immature Granulocytes: 0.09 K/uL — ABNORMAL HIGH (ref 0.00–0.07)
Basophils Absolute: 0.1 K/uL (ref 0.0–0.1)
Basophils Relative: 1 %
Eosinophils Absolute: 0.6 K/uL — ABNORMAL HIGH (ref 0.0–0.5)
Eosinophils Relative: 5 %
HCT: 41.3 % (ref 39.0–52.0)
Hemoglobin: 13.4 g/dL (ref 13.0–17.0)
Immature Granulocytes: 1 %
Lymphocytes Relative: 16 %
Lymphs Abs: 2.1 K/uL (ref 0.7–4.0)
MCH: 30 pg (ref 26.0–34.0)
MCHC: 32.4 g/dL (ref 30.0–36.0)
MCV: 92.6 fL (ref 80.0–100.0)
Monocytes Absolute: 1.2 K/uL — ABNORMAL HIGH (ref 0.1–1.0)
Monocytes Relative: 9 %
Neutro Abs: 9 K/uL — ABNORMAL HIGH (ref 1.7–7.7)
Neutrophils Relative %: 68 %
Platelets: 361 K/uL (ref 150–400)
RBC: 4.46 MIL/uL (ref 4.22–5.81)
RDW: 13.2 % (ref 11.5–15.5)
WBC: 13 K/uL — ABNORMAL HIGH (ref 4.0–10.5)
nRBC: 0 % (ref 0.0–0.2)

## 2023-04-19 LAB — CYTOLOGY - NON PAP

## 2023-04-19 LAB — GLUCOSE, CAPILLARY
Glucose-Capillary: 116 mg/dL — ABNORMAL HIGH (ref 70–99)
Glucose-Capillary: 109 mg/dL — ABNORMAL HIGH (ref 70–99)
Glucose-Capillary: 190 mg/dL — ABNORMAL HIGH (ref 70–99)
Glucose-Capillary: 177 mg/dL — ABNORMAL HIGH (ref 70–99)

## 2023-04-19 LAB — CULTURE, BAL-QUANTITATIVE W GRAM STAIN

## 2023-04-19 LAB — ACID FAST SMEAR (AFB, MYCOBACTERIA): Acid Fast Smear: NEGATIVE

## 2023-04-19 LAB — PROTIME-INR
INR: 0.9 (ref 0.8–1.2)
Prothrombin Time: 12.5 s (ref 11.4–15.2)

## 2023-04-19 LAB — APTT: aPTT: 28 s (ref 24–36)

## 2023-04-19 MED ORDER — OXYMETAZOLINE HCL 0.05 % NA SOLN
1.0000 | Freq: Once | NASAL | Status: AC
  Administered 2023-04-19: 1 via NASAL
  Filled 2023-04-19 (×2): qty 15

## 2023-04-19 MED ORDER — OXYCODONE HCL 5 MG PO TABS
5.0000 mg | ORAL_TABLET | Freq: Once | ORAL | Status: AC
  Administered 2023-04-19: 5 mg via ORAL
  Filled 2023-04-19: qty 1

## 2023-04-19 MED ORDER — IPRATROPIUM-ALBUTEROL 0.5-2.5 (3) MG/3ML IN SOLN
3.0000 mL | Freq: Four times a day (QID) | RESPIRATORY_TRACT | Status: DC
  Administered 2023-04-19 – 2023-04-20 (×2): 3 mL via RESPIRATORY_TRACT
  Filled 2023-04-19 (×3): qty 3

## 2023-04-19 NOTE — Progress Notes (Signed)
Pharmacy Antibiotic Note  Mario [name] is a 73 y.o. male admitted on 04/01/2023 with persistent dyspnea. He was admitted 1/17 to 2/1 and presented back on same day of discharge. He has been having hemoptysis, anticoagulation held. He was started on nebulizers and an empiric course of doxycycline. Pharmacy has been consulted for vancomycin and Zosyn dosing.  Per MD, planning 7 days of therapy. Today is day #7 of antibiotics. Renal function has been stable. Pharmacy will order antibiotics to stop today and sign off.   Plan: -Continue vancomycin 1250 mg IV q12h -Zosyn 3.375 g IV q8h extended infusion  Height: 6\' 2"  (188 cm) Weight: (!) 138 kg (304 lb 3.8 oz) IBW/kg (Calculated) : 82.2  Temp (24hrs), Avg:98.3 F (36.8 C), Min:98.2 F (36.8 C), Max:98.4 F (36.9 C)  Recent Labs  Lab 04/13/23 0506 04/14/23 0507 04/15/23 0523 04/16/23 0451 04/17/23 0509 04/18/23 0458  WBC 12.1*  --   --   --   --   --   CREATININE 0.82 0.96 0.86 0.78 0.83 0.89    Estimated Creatinine Clearance: 112.5 mL/min (by C-G formula based on SCr of 0.89 mg/dL).    Allergies  Allergen Reactions   Demerol [Meperidine] Nausea And Vomiting and Other (See Comments)    Made the patient "violently sick"   Zocor [Simvastatin] Nausea And Vomiting and Other (See Comments)    Made him very jittery, also   Beet [Beta Vulgaris] Nausea And Vomiting   Liver Nausea And Vomiting    Antimicrobials this admission: Zosyn 2/13 >> 2/19 Vancomycin 2/13 >> 2/19 Doxycycline 2/10 >> 2/16  Dose adjustments this admission: NA  Microbiology results: 2/12 Sputum cx: normal flora 2/17 BAL cx: NG <24hrs 2/17 fungal cx: in process   Thank you for allowing pharmacy to be a part of this patient's care.  Cherylin Mylar, PharmD Clinical Pharmacist  2/19/20258:03 AM

## 2023-04-19 NOTE — Plan of Care (Signed)
  Problem: Pain Managment: Goal: General experience of comfort will improve and/or be controlled Outcome: Progressing   Problem: Safety: Goal: Ability to remain free from injury will improve Outcome: Progressing

## 2023-04-19 NOTE — Consult Note (Addendum)
ENT CONSULT:  Reason for Consult: Epistaxis  Referring Physician: Dr. Ashley Royalty  HPI: Mario [name] is an 73 y.o. male w/ h/o PE (prior on eliquis, discontinued), DM, BPH, HTN, GAD and COPD on 3L [place] admitted this month for respiratory failure and PNA. He also had some hemoptysis for which he underwent BAL and Bronch for this, and has known stage IV Lung Ca ENT was consulted on 2/19 for epistaxis, which started suddenly evening of 2/19. He reports that he has some baseline cough but was not straining or sneezing or blowing his nose when this occurred. Epistaxis history: denies history of epistaxis; this episode was bilateral but left worse than right. Nurse and primary team tried afrin soaked gauze but with persistent bleeding so ENT was consulted. HTN: yes CKD/Liver dysfunction: denies Anticoagulation/AP: on lovenox but no other AC/AP currently Trauma: denies History of Sinusitis: denies Nasal obstruction: denies Nasal procedures: denies Current nasal medication use: has been using afrin but did not help. He does have hemoptysis but this was in setting of his lung cancer.   Past Medical History:  Diagnosis Date   Ankylosing spondylitis lumbar region [facility]) 09/25/2022   Anxiety    Bronchitis    COPD (chronic obstructive pulmonary disease) (HCC)    Depression    History of radiation therapy    Left lung- 10/05/20-10/15/20- Dr. Antony Blackbird   Hypertension    lung ca 09/2020   MI (myocardial infarction) [facility])    ????   On home oxygen therapy    4L/min [place]   OSA (obstructive sleep apnea)    Suicide attempt (HCC)    Tension pneumothorax 06/27/2016   Uveitis     Past Surgical History:  Procedure Laterality Date   BIOPSY  07/03/2021   Procedure: BIOPSY;  Surgeon: Kathi Der, MD;  Location: [facility] ENDOSCOPY;  Service: Gastroenterology;;   BRONCHIAL BIOPSY  07/30/2020   Procedure: BRONCHIAL BIOPSIES;  Surgeon: Josephine Igo, DO;  Location: [facility] ENDOSCOPY;  Service: Pulmonary;;    BRONCHIAL BRUSHINGS  07/30/2020   Procedure: BRONCHIAL BRUSHINGS;  Surgeon: Josephine Igo, DO;  Location: [facility] ENDOSCOPY;  Service: Pulmonary;;   BRONCHIAL NEEDLE ASPIRATION BIOPSY  07/30/2020   Procedure: BRONCHIAL NEEDLE ASPIRATION BIOPSIES;  Surgeon: Josephine Igo, DO;  Location: [facility] ENDOSCOPY;  Service: Pulmonary;;   BRONCHIAL WASHINGS  07/30/2020   Procedure: BRONCHIAL WASHINGS;  Surgeon: Josephine Igo, DO;  Location: [facility] ENDOSCOPY;  Service: Pulmonary;;   BRONCHIAL WASHINGS  04/17/2023   Procedure: BRONCHIAL WASHINGS;  Surgeon: Lorin Glass, MD;  Location: [facility] ENDOSCOPY;  Service: Pulmonary;;   CHEST TUBE INSERTION Left 06/27/2016   CRYOTHERAPY  04/17/2023   Procedure: CRYOTHERAPY;  Surgeon: Lorin Glass, MD;  Location: [facility] ENDOSCOPY;  Service: Pulmonary;;   cryptorchidism     ESOPHAGOGASTRODUODENOSCOPY N/A 07/03/2021   Procedure: ESOPHAGOGASTRODUODENOSCOPY (EGD);  Surgeon: Kathi Der, MD;  Location: [facility] ENDOSCOPY;  Service: Gastroenterology;  Laterality: N/A;   HEMOSTASIS CONTROL  04/17/2023   Procedure: HEMOSTASIS CONTROL;  Surgeon: Lorin Glass, MD;  Location: [facility] ENDOSCOPY;  Service: Pulmonary;;   IR PERC PLEURAL DRAIN W/INDWELL CATH W/IMG GUIDE  08/04/2021   IR REMOVAL OF PLURAL CATH W/CUFF  09/01/2021   SKIN CANCER EXCISION     VIDEO BRONCHOSCOPY Left 04/17/2023   Procedure: VIDEO BRONCHOSCOPY WITHOUT FLUORO;  Surgeon: Lorin Glass, MD;  Location[facility] ENDOSCOPY;  Service: Pulmonary;  Laterality: Left;   VIDEO BRONCHOSCOPY WITH ENDOBRONCHIAL NAVIGATION Left 07/30/2020   Procedure: VIDEO BRONCHOSCOPY WITH ENDOBRONCHIAL NAVIGATION;  Surgeon: Josephine Igo, DO;  Location: [facility] ENDOSCOPY;  Service: Pulmonary;  Laterality: Left;    Family History  Problem Relation Age of Onset   Dementia Father     Social History:  reports that he quit smoking about 6 years ago. His smoking use included cigarettes. He started smoking about 41 years ago. He has a 35 pack-year smoking history. He  has been exposed to tobacco smoke. He has never used smokeless tobacco. He reports that he does not drink alcohol and does not use drugs.  Allergies:  Allergies  Allergen Reactions   Demerol [Meperidine] Nausea And Vomiting and Other (See Comments)    Made the patient "violently sick"   Zocor [Simvastatin] Nausea And Vomiting and Other (See Comments)    Made him very jittery, also   Beet [Beta Vulgaris] Nausea And Vomiting   Liver Nausea And Vomiting    Medications: I have reviewed the patient's current medications.  Results for orders placed or performed during the hospital encounter of 04/01/23 (from the past 48 hours)  Glucose, capillary     Status: Abnormal   Collection Time: 04/17/23  9:28 PM  Result Value Ref Range   Glucose-Capillary 186 (H) 70 - 99 mg/dL    Comment: Glucose reference range applies only to samples taken after fasting for at least 8 hours.  Glucose, capillary     Status: Abnormal   Collection Time: 04/18/23  1:15 AM  Result Value Ref Range   Glucose-Capillary 150 (H) 70 - 99 mg/dL    Comment: Glucose reference range applies only to samples taken after fasting for at least 8 hours.  Basic metabolic panel     Status: Abnormal   Collection Time: 04/18/23  4:58 AM  Result Value Ref Range   Sodium 133 (L) 135 - 145 mmol/L   Potassium 3.7 3.5 - 5.1 mmol/L   Chloride 100 98 - 111 mmol/L   CO2 25 22 - 32 mmol/L   Glucose, Bld 93 70 - 99 mg/dL    Comment: Glucose reference range applies only to samples taken after fasting for at least 8 hours.   BUN 20 8 - 23 mg/dL   Creatinine, Ser 8.65 0.61 - 1.24 mg/dL   Calcium 8.2 (L) 8.9 - 10.3 mg/dL   GFR, Estimated >78 >46 mL/min    Comment: (NOTE) Calculated using the CKD-EPI Creatinine Equation (2021)    Anion gap 8 5 - 15    Comment: Performed at [facility], [address], [place], [place] [address]  Magnesium     Status: None   Collection Time: 04/18/23  4:58 AM  Result Value Ref Range    Magnesium 2.1 1.7 - 2.4 mg/dL    Comment: Performed at [facility], [address] [place], [place] [address]  Glucose, capillary     Status: Abnormal   Collection Time: 04/18/23  7:55 AM  Result Value Ref Range   Glucose-Capillary 130 (H) 70 - 99 mg/dL    Comment: Glucose reference range applies only to samples taken after fasting for at least 8 hours.  Glucose, capillary     Status: Abnormal   Collection Time: 04/18/23 11:27 AM  Result Value Ref Range   Glucose-Capillary 131 (H) 70 - 99 mg/dL    Comment: Glucose reference range applies only to samples taken after fasting for at least 8 hours.  Glucose, capillary     Status: Abnormal   Collection Time: 04/18/23  5:17 PM  Result  Value Ref Range   Glucose-Capillary 209 (H) 70 - 99 mg/dL    Comment: Glucose reference range applies only to samples taken after fasting for at least 8 hours.  Glucose, capillary     Status: Abnormal   Collection Time: 04/18/23  9:00 PM  Result Value Ref Range   Glucose-Capillary 112 (H) 70 - 99 mg/dL    Comment: Glucose reference range applies only to samples taken after fasting for at least 8 hours.  Glucose, capillary     Status: Abnormal   Collection Time: 04/19/23  8:01 AM  Result Value Ref Range   Glucose-Capillary 116 (H) 70 - 99 mg/dL    Comment: Glucose reference range applies only to samples taken after fasting for at least 8 hours.  Glucose, capillary     Status: Abnormal   Collection Time: 04/19/23 11:44 AM  Result Value Ref Range   Glucose-Capillary 109 (H) 70 - 99 mg/dL    Comment: Glucose reference range applies only to samples taken after fasting for at least 8 hours.  Glucose, capillary     Status: Abnormal   Collection Time: 04/19/23  4:44 PM  Result Value Ref Range   Glucose-Capillary 190 (H) 70 - 99 mg/dL    Comment: Glucose reference range applies only to samples taken after fasting for at least 8 hours.  CBC with Differential/Platelet     Status: Abnormal    Collection Time: 04/19/23  5:45 PM  Result Value Ref Range   WBC 13.0 (H) 4.0 - 10.5 K/uL   RBC 4.46 4.22 - 5.81 MIL/uL   Hemoglobin 13.4 13.0 - 17.0 g/dL   HCT 21.3 08.6 - 57.8 %   MCV 92.6 80.0 - 100.0 fL   MCH 30.0 26.0 - 34.0 pg   MCHC 32.4 30.0 - 36.0 g/dL   RDW 46.9 62.9 - 52.8 %   Platelets 361 150 - 400 K/uL   nRBC 0.0 0.0 - 0.2 %   Neutrophils Relative % 68 %   Neutro Abs 9.0 (H) 1.7 - 7.7 K/uL   Lymphocytes Relative 16 %   Lymphs Abs 2.1 0.7 - 4.0 K/uL   Monocytes Relative 9 %   Monocytes Absolute 1.2 (H) 0.1 - 1.0 K/uL   Eosinophils Relative 5 %   Eosinophils Absolute 0.6 (H) 0.0 - 0.5 K/uL   Basophils Relative 1 %   Basophils Absolute 0.1 0.0 - 0.1 K/uL   Immature Granulocytes 1 %   Abs Immature Granulocytes 0.09 (H) 0.00 - 0.07 K/uL    Comment: Performed at [facility], [address], [place], [place] [address]  Protime-INR     Status: None   Collection Time: 04/19/23  5:45 PM  Result Value Ref Range   Prothrombin Time 12.5 11.4 - 15.2 seconds   INR 0.9 0.8 - 1.2    Comment: (NOTE) INR goal varies based on device and disease states. Performed at [facility], [address] [place], [place] [address]   APTT     Status: None   Collection Time: 04/19/23  5:45 PM  Result Value Ref Range   aPTT 28 24 - 36 seconds    Comment: Performed at [facility] [address], [place], [place] [address]    No results found.  ROS:see above; otherwise positive for cough and SOB but denies chest pain  Blood pressure (!) 156/88, pulse 98, temperature 97.8 F (36.6 C), temperature source Oral, resp. rate 18, height 6\' 2"  (1.88  m), weight (!) 138 kg, SpO2 (!) 88%.  PHYSICAL EXAM: CONSTITUTIONAL: well developed, nourished and alert and oriented x 3 CARDIOVASCULAR: normal rate PULMONARY/CHEST WALL: effort normal and no stridor, no stertor, no dysphonia HENT: Head : normocephalic Ears: Right ear:   canal normal,  external ear normal  Left ear:   canal normal, external ear normal Nose: bilateral clot but clearly left worse than right; some posterior oropharyngeal bleeding and some left anterior naris bleeding. Once a little clot was suctioned on left, clear fair amount of left sided nasal bleeding. No bleeding from right until left started bleeding. Suspect bleeding source is on left. See below for procedure. After afrin/pressure did not work, 7.5 cm AP rhinorocket placed with subsequent cessation.  Mouth/Throat:  Mouth: uvula midline and no oral lesions; small amount of OP clot Mucous membranes: normal EYES: conjunctiva normal, EOM normal NECK: supple  Studies Reviewed: PTT 04/19/2023 - wnl; INR 04/19/2023: 0.9; CBC w/diff 04/19/2023: WBC 13.0, Hgb 13.4 (around 13-14 this admissino), plt 361;  BMP 04/18/2023: Na 133, K 3.7, Ca 8.2, Bun/CR wnl Notes from Dr. Judeth Horn (Pulm 04/19/2023) reviewed independently: noted hemoptysis and lung ca; on abx, no improvemet; no option for embo for hemoptysis; signed off Dr. Ashley Royalty hospitalist team: 04/19/2023: noted respiratory failure, PE, lung cancer, DM; finishing abx 04/19/2023; on inhalers and steroid taper; no curative optinos for lung it appears  Assessment/Plan: 73 yo prior on eliquis and with hemoptysis likely 2/2 lung cancer who ENT was consulted for epistaxis. Appears to be coming from left, unclear etiology but could be dry nose due to nasal cannula or nasal instrumentation. No noted kidney or liver dysfunction. Not on AP/AC. On exam, bleed appears to be from right. Significant improved and stopped after left AP rhinorocket placement  Plan: - Instructions to help prevent future episodes of nose bleeds and management: - Reduce nasal manipulation - no nose picking, no tissues in the nose other than for dabbing.  - Use Afrin nasal spray q8h right nose (2-3 sprays) for 3 days; can use nasal saline spray q4h prn.  - Will have some blood tinged drainage for nex 24-28  hours. - No nasal cannula, can use humidified face mask. - Maintain tight BP control, as high blood pressure will certainly increase epistaxis occurrences.  - Would recommend keflex 500 BID until pack is removed at conclusion of current abx course. - Contact ENT with questions. If patient is stable for discharge prior to pack removal (anticipate 4-5 days), can discharge and page ENT for follow up  MDM:  Complexity/Problems addressed: mod Data complexity: mod - Morbidity: mod - Prescription Drug prescribed or managed: y   Read Drivers   04/19/2023, 7:35 PM

## 2023-04-19 NOTE — Plan of Care (Signed)
  Problem: Fluid Volume: Goal: Ability to maintain a balanced intake and output will improve 04/19/2023 2315 by Josph Macho, RN Outcome: Progressing 04/19/2023 1928 by Alinda Deem D, RN Outcome: Progressing   Problem: Coping: Goal: Level of anxiety will decrease 04/19/2023 2315 by Josph Macho, RN Outcome: Progressing 04/19/2023 1928 by Josph Macho, RN Outcome: Progressing   Problem: Pain Managment: Goal: General experience of comfort will improve and/or be controlled 04/19/2023 2315 by Josph Macho, RN Outcome: Progressing 04/19/2023 1928 by Josph Macho, RN Outcome: Progressing   Problem: Safety: Goal: Ability to remain free from injury will improve 04/19/2023 2315 by Josph Macho, RN Outcome: Progressing 04/19/2023 1928 by Josph Macho, RN Outcome: Progressing

## 2023-04-19 NOTE — Progress Notes (Signed)
Physical Therapy Treatment Patient Details Name: Mario [name] MRN: [id number] DOB: 03/21/1950 Today's Date: 04/19/2023   History of Present Illness Patient is a 73 year old male presented to the ED on 04/01/23 for evaluation of shortness of breath. Pt was discharged earlier same day after being admitted initially on 03/17/2023 for acute on chronic hypoxic respiratory failure in the setting of community-acquired pneumonia, RSV infection, and COPD exacerbation.    Pt with bronchoscopy on 04/17/23.  Pt with medical history significant for COPD, chronic hypoxic respiratory failure on 5 L O2 [place], history of PE on Eliquis, history of left lung cancer (s/p radiation), T2DM, HTN, BPH, GAD    PT Comments  Pt making good progress with improved activity tolerance.  His goals have been met or are adequate for d/c from PT.  Pt ambulating 100' without AD with steady gait pattern.  He is on 4 L rest and 6 L ambulation with sats maintaining >88%.  At baseline pt on 4-6 L at home.  Pt does have steps at home (split level) but reports just takes his time with rest breaks as needed.   Will sign off from PT at this time.    If plan is discharge home, recommend the following: Help with stairs or ramp for entrance;Assistance with cooking/housework;Assist for transportation   Can travel by private vehicle        Equipment Recommendations  None recommended by PT    Recommendations for Other Services       Precautions / Restrictions Precautions Precautions: Fall     Mobility  Bed Mobility Overal bed mobility: Modified Independent                  Transfers Overall transfer level: Modified independent Equipment used: None Transfers: Sit to/from Stand Sit to Stand: Modified independent (Device/Increase time)                Ambulation/Gait Ambulation/Gait assistance: Supervision Gait Distance (Feet): 100 Feet Assistive device: None Gait Pattern/deviations: Step-through pattern Gait  velocity: functional     General Gait Details: Steady gait; no LOB;took short standing break at window and returned to room.   Stairs             Wheelchair Mobility     Tilt Bed    Modified Rankin (Stroke Patients Only)       Balance Overall balance assessment: Needs assistance Sitting-balance support: No upper extremity supported Sitting balance-Leahy Scale: Good     Standing balance support: No upper extremity supported Standing balance-Leahy Scale: Good                              Communication    Cognition Arousal: Alert Behavior During Therapy: WFL for tasks assessed/performed   PT - Cognitive impairments: No apparent impairments                                Cueing    Exercises      General Comments General comments (skin integrity, edema, etc.): Pt on 4 L O2 rest sats 92%.  6 L for mobility with sats 88% at lowest with walking.  Recovered to 90% in 30 seconds and able to decrease back to 4 L O2      Pertinent Vitals/Pain Pain Assessment Pain Assessment: No/denies pain    Home Living  Prior Function            PT Goals (current goals can now be found in the care plan section) Acute Rehab PT Goals PT Goal Formulation: All assessment and education complete, DC therapy Progress towards PT goals: Goals met/education completed, patient discharged from PT (goals met or adequate for d/c)    Frequency    Min 1X/week      PT Plan      Co-evaluation              AM-PAC PT "6 Clicks" Mobility   Outcome Measure  Help needed turning from your back to your side while in a flat bed without using bedrails?: None Help needed moving from lying on your back to sitting on the side of a flat bed without using bedrails?: None Help needed moving to and from a bed to a chair (including a wheelchair)?: None Help needed standing up from a chair using your arms (e.g., wheelchair or  bedside chair)?: None Help needed to walk in hospital room?: A Little Help needed climbing 3-5 steps with a railing? : A Little 6 Click Score: 22    End of Session Equipment Utilized During Treatment: Gait belt;Oxygen Activity Tolerance: Patient tolerated treatment well Patient left: in bed;with call bell/phone within reach Nurse Communication: Mobility status PT Visit Diagnosis: Other abnormalities of gait and mobility (R26.89)     Time: 1610-9604 PT Time Calculation (min) (ACUTE ONLY): 14 min  Charges:    $Gait Training: 8-22 mins PT General Charges $$ ACUTE PT VISIT: 1 Visit                     Anise Salvo, PT Acute Rehab Services [facility] [phone]'   Rayetta Humphrey 04/19/2023, 3:25 PM

## 2023-04-19 NOTE — Procedures (Signed)
PROCEDURE NOTE: Preoperative diagnosis: epistaxis, bilateral but primarily left Postoperative diagnosis: same  Procedure: Control of nasal hemorrhage (CPT [phone]) Indication: Epistaxis EBL: 25 mL Surgeon: Jovita Kussmaul, MD   Procedure: The patient was identified and properly positioned.  He was examined and noted to have bilateral clot but clearly left worse than right; some posterior oropharyngeal bleeding present and some left anterior naris bleeding. Once a little clot was suctioned on left,  fair amount of left sided nasal bleeding was noted. No bleeding from right until left started bleeding. Suspect bleeding source was on left. Afrin and pressure were then applied for 10 minutes without cessation. Headlight and speculum were unable to definitively identify  source of bleeding. As such, a 7.5 cm AP rhinorocket was placed in left naris and both balloons inflated. There was significant improvement in bleeding thereafter and constant nasal pressure was held for 10 minutes. Minimal clot noted in oropharynx but no active bleeding noted thereafter.  Patient tolerated procedure well  See consult note for recommendations  Read Drivers

## 2023-04-19 NOTE — Progress Notes (Signed)
Pt refused cpt / vest at this time due to nose bleed.

## 2023-04-19 NOTE — Progress Notes (Signed)
Pt called me into the room @ 1700 stating he had a bad nose bleed. Mr Barfoot was sitting on side of the bed with blood saturating his gown and several wash rags that were thrown in the trash. This RN applied pressure to bridge of his nose and asked secretary to call down to ED for a "rhino rocket" to stop the bleeding. Ed sent them but did not have personnel to place so RR and AC came to assist, MD notified and Afrin was ordered and administered. MD  is consulting ENT as well as placed new orders. Mr Boateng is sitting on side of the bed eating dinner, suction by his side and new O2 tubing. Bleeding has currently stopped.

## 2023-04-19 NOTE — Progress Notes (Addendum)
PROGRESS NOTE    Mario [name]  ZOX:[id number] DOB: Feb 12, 1951 DOA: 04/01/2023 PCP: [name]   Brief Narrative: 73 y.o. 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  Came back to the ED with a white count of 13 BNP of 21 chest x-ray showed right basilar infiltrate with persistent airspace disease.  Due to ongoing shortness of breath hemoptysis a CT of the chest was done that showed large left lower lobe infiltrate some pulmonary fibrosis mild loculated fluid collection pulmonary and critical care was consulted and related that the limited endobronchial due to his oxygen requirement.  He was continued on empiric antibiotics and diuretics.  He continues to have hemoptysis despite 1 week of anticoagulation interruption.   Assessment & Plan:   Principal Problem:   COPD with acute bronchitis (HCC) Active Problems:   Chronic respiratory failure with hypoxia, on home O2 therapy (HCC) - 4 L/min   DM2 (diabetes mellitus, type 2) (HCC)   History of pulmonary embolus (PE)   BPH (benign prostatic hyperplasia)   OSA (obstructive sleep apnea)   Hypertension associated with diabetes (HCC)   COPD exacerbation (HCC)   Hemoptysis   Acute respiratory failure with hypoxia in a patient with COPD on 5 L of oxygen dependent with a recent pneumonia and RSV infection readmitted and found to have hemoptysis while on DOAC: Continues to have hemoptysis.  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  Patient with known history of lung malignancy in both lungs.  He has stage IV lung CA per PCCM there is nothing more they can offer.  Patient will follow-up with his outpatient oncologist. He is status post TXA treatments,  IR was unable to embolize, status post bronch He was continue on inhalers and steroid taper.  Will need complete antibiotics for 7 days.  He will finish his 7 days on 04/19/2023. Continue incentive spirometry and flutter valve out of bed to chair. For therapy evaluated the patient no home health PT. Status post bronchoscopy on 04/17/2023 showed focal area of granulation tissue that was biopsy as it was oozing cauterized BAL performed.   History of PE: Was on Eliquis prior to admission discontinued due to hemoptysis. Will need to be off Eliquis indefinitely. CT angio of the chest on 03/17/2023 was negative for PE.   History of lung cancer: Status post surgery.   Diabetes mellitus type 2: He is on low-dose steroids which will make his blood glucose erratic, continue sliding scale insulin,.   Essential hypertension: Continue diltiazem.   BPH: Continue Flomax.   General anxiety disorder: Continue bupropion, BuSpar, Cymbalta, Trileptal and trazodone.   Hypovolemic hyponatremia: Low today likely due to diuretic challenge According to chart he is negative about 11 L. His weight is only down by 1 kg.   Sepsis sleep apnea: Continue CPAP at night.   Constipation: Continue MiraLAX.   Nose bleed- afrin ns Consulted ENT dr patel Stat labs pending  Estimated body mass index is 39.06 kg/m as calculated from the following:   Height as of this encounter: 6\' 2"  (1.88 m).   Weight as of this encounter: 138 kg.  DVT prophylaxis: Lovenox  code Status: Full  family Communication: None  disposition Plan:  Status  is: Inpatient Remains inpatient appropriate because: Inpatient   Consultants:  PCCM  Procedures: Bronch Antimicrobials: Vanco mycin and Zosyn  Subjective: Sitting by the side of the bed getting breathing treatments asking for more breathing treatments multiple times during the day  Objective: Vitals:   04/19/23 0000 04/19/23 0604 04/19/23 0725 04/19/23 1300  BP:  130/66     Pulse:  73    Resp:  18    Temp:  98.4 F (36.9 C)    TempSrc:  Oral    SpO2: 93% 97% 99% 98%  Weight:      Height:        Intake/Output Summary (Last 24 hours) at 04/19/2023 1436 Last data filed at 04/19/2023 1348 Gross per 24 hour  Intake 1606.73 ml  Output 1600 ml  Net 6.73 ml   Filed Weights   04/16/23 0527 04/17/23 0500 04/18/23 0500  Weight: (!) 139 kg (!) 139.2 kg (!) 138 kg    Examination:  General exam: Appears in no acute distress  respiratory system: Coarse to auscultation. Respiratory effort normal. Cardiovascular system: S1 & S2 heard, RRR. No JVD, murmurs, rubs, gallops or clicks. No pedal edema. Gastrointestinal system: Abdomen is nondistended, soft and nontender. No organomegaly or masses felt. Normal bowel sounds heard. Central nervous system: Alert and oriented. Extremities: Trace edema   Data Reviewed: I have personally reviewed following labs and imaging studies  CBC: Recent Labs  Lab 04/13/23 0506  WBC 12.1*  HGB 13.6  HCT 42.7  MCV 94.3  PLT 306   Basic Metabolic Panel: Recent Labs  Lab 04/14/23 0507 04/15/23 0523 04/16/23 0451 04/17/23 0509 04/18/23 0458  NA 132* 134* 132* 131* 133*  K 3.8 4.8 3.9 3.9 3.7  CL 96* 101 98 97* 100  CO2 25 25 27 26 25   GLUCOSE 145* 180* 155* 152* 93  BUN 25* 19 18 17 20   CREATININE 0.96 0.86 0.78 0.83 0.89  CALCIUM 8.4* 8.4* 8.3* 8.3* 8.2*  MG 2.3 2.1 2.1 2.0 2.1   GFR: Estimated Creatinine Clearance: 112.5 mL/min (by C-G formula based on SCr of 0.89 mg/dL). Liver Function Tests: No results for input(s): "AST", "ALT", "ALKPHOS", "BILITOT", "PROT", "ALBUMIN" in the last 168 hours. No results for input(s): "LIPASE", "AMYLASE" in the last 168 hours. No results for input(s): "AMMONIA" in the last 168 hours. Coagulation Profile: No results for input(s): "INR", "PROTIME" in the last 168 hours. Cardiac Enzymes: No results for input(s): "CKTOTAL", "CKMB", "CKMBINDEX", "TROPONINI" in the last 168  hours. BNP (last 3 results) No results for input(s): "PROBNP" in the last 8760 hours. HbA1C: No results for input(s): "HGBA1C" in the last 72 hours. CBG: Recent Labs  Lab 04/18/23 1127 04/18/23 1717 04/18/23 2100 04/19/23 0801 04/19/23 1144  GLUCAP 131* 209* 112* 116* 109*   Lipid Profile: No results for input(s): "CHOL", "HDL", "LDLCALC", "TRIG", "CHOLHDL", "LDLDIRECT" in the last 72 hours. Thyroid Function Tests: No results for input(s): "TSH", "T4TOTAL", "FREET4", "T3FREE", "THYROIDAB" in the last 72 hours. Anemia Panel: No results for input(s): "VITAMINB12", "FOLATE", "FERRITIN", "TIBC", "IRON", "RETICCTPCT" in the last 72 hours. Sepsis Labs: No results for input(s): "PROCALCITON", "LATICACIDVEN" in the last 168 hours.  Recent Results (from the past 240 hours)  Expectorated Sputum Assessment w Gram Stain, Rflx to Resp Cult     Status: None   Collection Time: 04/12/23  3:47 AM   Specimen: Expectorated Sputum  Result Value Ref Range Status   Specimen Description EXPECTORATED SPUTUM  Final   Special Requests NONE  Final   Sputum evaluation   Final    THIS SPECIMEN IS ACCEPTABLE FOR SPUTUM CULTURE Performed at [facility], [address] [place], [place] [address]    Report Status 04/12/2023 FINAL  Final  Culture, Respiratory w Gram Stain     Status: None   Collection Time: 04/12/23  3:47 AM  Result Value Ref Range Status   Specimen Description   Final    EXPECTORATED SPUTUM Performed at [facility], [address] [place], [place] [address]    Special Requests   Final    NONE Reflexed from [id number] Performed at [facility], [address] [place], [place] [address]    Gram Stain NO WBC SEEN RARE GRAM POSITIVE COCCI   Final   Culture   Final    Normal respiratory flora-no Staph aureus or Pseudomonas seen Performed at [facility], [address] [place], [place] [address]    Report Status  04/14/2023 FINAL  Final  Culture, BAL-quantitative w Gram Stain     Status: None   Collection Time: 04/17/23  1:11 PM   Specimen: Bronchial Alveolar Lavage; Respiratory  Result Value Ref Range Status   Specimen Description BRONCHIAL ALVEOLAR LAVAGE  Final   Special Requests LUL  Final   Gram Stain   Final    RARE WBC PRESENT, PREDOMINANTLY PMN NO ORGANISMS SEEN    Culture   Final    NO GROWTH < 24 HOURS Performed at [facility], [address] [place], [place] [address]    Report Status 04/19/2023 FINAL  Final  Aerobic/Anaerobic Culture w Gram Stain (surgical/deep wound)     Status: None (Preliminary result)   Collection Time: 04/17/23  1:11 PM   Specimen: Bronchial Alveolar Lavage; Respiratory  Result Value Ref Range Status   Specimen Description BRONCHIAL ALVEOLAR LAVAGE  Final   Special Requests LUL  Final   Gram Stain   Final    RARE WBC PRESENT, PREDOMINANTLY PMN NO ORGANISMS SEEN Performed at [facility], [address], [place], [place] [address]    Culture   Final    RARE Normal respiratory flora-no Staph aureus or Pseudomonas seen NO ANAEROBES ISOLATED; CULTURE IN PROGRESS FOR 5 DAYS    Report Status PENDING  Incomplete         Radiology Studies: No results found.      Scheduled Meds:  arformoterol  15 mcg Nebulization BID   ascorbic acid  500 mg Oral Daily   azelastine  2 spray Each Nare BID   budesonide (PULMICORT) nebulizer solution  0.25 mg Nebulization BID   buPROPion  300 mg Oral q morning   busPIRone  15 mg Oral Daily   cholecalciferol  1,000 Units Oral Daily   diltiazem  180 mg Oral Daily   docusate sodium  200 mg Oral BID   DULoxetine  60 mg Oral BID   fluticasone  2 spray Each Nare Daily   folic acid  1 mg Oral Daily   gabapentin  100 mg Oral TID   guaiFENesin  1,200 mg Oral BID   insulin aspart  0-5 Units Subcutaneous QHS   insulin aspart  0-9 Units Subcutaneous TID WC   insulin aspart  3 Units Subcutaneous TID WC    insulin glargine-yfgn  5 Units Subcutaneous BID   ipratropium-albuterol  3 mL Nebulization Q6H   magnesium oxide  200 mg Oral Daily   melatonin  3 mg  Oral QHS   multivitamin with minerals  1 tablet Oral Daily   Oxcarbazepine  600 mg Oral TID   pantoprazole  40 mg Oral BID   polyethylene glycol  17 g Oral TID   revefenacin  175 mcg Nebulization Daily   sodium chloride  2 spray Each Nare BID   tamsulosin  0.8 mg Oral Daily   tranexamic acid  500 mg Nebulization Q8H   traZODone  75 mg Oral QHS   Continuous Infusions:  piperacillin-tazobactam (ZOSYN)  IV 3.375 g (04/19/23 1343)   vancomycin 1,250 mg (04/19/23 1344)     LOS: 15 days   Alwyn Ren, MD 04/19/2023, 2:36 PM

## 2023-04-19 NOTE — Plan of Care (Signed)
  Problem: Metabolic: Goal: Ability to maintain appropriate glucose levels will improve Outcome: Completed/Met   Problem: Nutritional: Goal: Maintenance of adequate nutrition will improve Outcome: Completed/Met

## 2023-04-20 LAB — GLUCOSE, CAPILLARY
Glucose-Capillary: 151 mg/dL — ABNORMAL HIGH (ref 70–99)
Glucose-Capillary: 176 mg/dL — ABNORMAL HIGH (ref 70–99)
Glucose-Capillary: 137 mg/dL — ABNORMAL HIGH (ref 70–99)
Glucose-Capillary: 161 mg/dL — ABNORMAL HIGH (ref 70–99)

## 2023-04-20 MED ORDER — METOPROLOL TARTRATE 5 MG/5ML IV SOLN
5.0000 mg | Freq: Four times a day (QID) | INTRAVENOUS | Status: DC | PRN
  Administered 2023-04-20: 5 mg via INTRAVENOUS
  Filled 2023-04-20: qty 5

## 2023-04-20 MED ORDER — IPRATROPIUM-ALBUTEROL 0.5-2.5 (3) MG/3ML IN SOLN
3.0000 mL | Freq: Two times a day (BID) | RESPIRATORY_TRACT | Status: DC
  Filled 2023-04-20 (×2): qty 3

## 2023-04-20 MED ORDER — ALBUTEROL SULFATE (2.5 MG/3ML) 0.083% IN NEBU
2.5000 mg | INHALATION_SOLUTION | RESPIRATORY_TRACT | Status: DC | PRN
  Filled 2023-04-20: qty 3

## 2023-04-20 MED ORDER — OXYMETAZOLINE HCL 0.05 % NA SOLN
3.0000 | Freq: Three times a day (TID) | NASAL | Status: AC
  Administered 2023-04-20 – 2023-04-23 (×7): 3 via NASAL
  Filled 2023-04-20: qty 15

## 2023-04-20 MED ORDER — METOPROLOL TARTRATE 5 MG/5ML IV SOLN: 10.0000 mg | Freq: Four times a day (QID) | INTRAVENOUS | Status: DC | PRN

## 2023-04-20 MED ORDER — HYDRALAZINE HCL 20 MG/ML IJ SOLN
10.0000 mg | Freq: Four times a day (QID) | INTRAMUSCULAR | Status: DC | PRN
  Administered 2023-04-20: 10 mg via INTRAVENOUS
  Filled 2023-04-20: qty 1

## 2023-04-20 NOTE — Progress Notes (Addendum)
PROGRESS NOTE    Mario [name]  BJY:[id number] DOB: 1950-03-19 DOA: 04/01/2023 [facility]: [name]   Brief Narrative: 73 y.o. 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  Came back to the ED with a white count of 13 BNP of 21 chest x-ray showed right basilar infiltrate with persistent airspace disease.  Due to ongoing shortness of breath hemoptysis a CT of the chest was done that showed large left lower lobe infiltrate some pulmonary fibrosis mild loculated fluid collection pulmonary and critical care was consulted and related that the limited endobronchial due to his oxygen requirement.  He was continued on empiric antibiotics and diuretics.  He continues to have hemoptysis despite 1 week of anticoagulation interruption.   Assessment & Plan:   Principal Problem:   COPD with acute bronchitis (HCC) Active Problems:   Chronic respiratory failure with hypoxia, on home O2 therapy (HCC) - 4 L/min   DM2 (diabetes mellitus, type 2) (HCC)   History of pulmonary embolus (PE)   BPH (benign prostatic hyperplasia)   OSA (obstructive sleep apnea)   Hypertension associated with diabetes (HCC)   COPD exacerbation (HCC)   Hemoptysis   Epistaxis   Acute respiratory failure with hypoxia in a patient with COPD on 5 L of oxygen dependent with a recent pneumonia and RSV infection readmitted and found to have hemoptysis while on DOAC:  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  Patient with known history of lung malignancy in both lungs.  He has stage IV lung CA per PCCM there is nothing more they can offer.  Patient will follow-up with his outpatient oncologist. He is status post TXA treatments, IR was unable to  embolize, status post bronch He was continue on inhalers and steroid taper.  Will need complete antibiotics for 7 days.  He initially his antibiotics on 04/19/2023.  Continue incentive spirometry and flutter valve out of bed to chair. Status post bronchoscopy on 04/17/2023 showed focal area of granulation tissue that was biopsy as it was oozing cauterized BAL performed.   History of PE: Was on Eliquis prior to admission discontinued due to hemoptysis. Will need to be off Eliquis indefinitely. CT angio of the chest on 03/17/2023 was negative for PE.   History of lung cancer: Status post surgery.   Diabetes mellitus type 2: He is on low-dose steroids which will make his blood glucose erratic, continue sliding scale insulin CBG (last 3)  Recent Labs    04/19/23 2135 04/20/23 0717 04/20/23 1134  GLUCAP 177* 151* 176*   A1C 6.07 Mar 2023   Essential hypertension: Continue diltiazem. Lopressor 5 mg every 6 as needed for systolic above 150   BPH: Continue Flomax.   General anxiety disorder: Continue bupropion, BuSpar, Cymbalta, Trileptal and trazodone.   Hypovolemic hyponatremia:improved na 133   sleep apnea: Continue CPAP at night.   Constipation: Continue MiraLAX.   Epistaxis-seen by Dr. Allena Katz last evening nasal pack left nostril placed to remain in place for 4 to 5 days, continue the oxygen with facemask aerosolized, continue Afrin nasal spray every 8 hours for 3 days and nasal saline spray every 4 hours  Estimated body mass index is 39.06 kg/m as calculated from the following:  Height as of this encounter: 6\' 2"  (1.88 m).   Weight as of this encounter: 138 kg.  DVT prophylaxis: Lovenox  code Status: Full  family Communication: None  disposition Plan:  Status is: Inpatient Remains inpatient appropriate because: Inpatient   Consultants:  PCCM  Procedures: Bronch Antimicrobials: Vanco mycin and Zosyn  Subjective:  He tells me he cannot take care of himself like  this at home nasal pack on the left nose with facemask in place  Objective: Vitals:   04/20/23 0031 04/20/23 0549 04/20/23 0744 04/20/23 0756  BP: (!) 157/78 (!) 154/80    Pulse: 97 99    Resp: 19 18    Temp: 98.8 F (37.1 C) 98.7 F (37.1 C)    TempSrc: Oral Oral    SpO2: 94% 96% 99% 99%  Weight:      Height:        Intake/Output Summary (Last 24 hours) at 04/20/2023 1313 Last data filed at 04/20/2023 1137 Gross per 24 hour  Intake 1883.6 ml  Output 3200 ml  Net -1316.4 ml   Filed Weights   04/16/23 0527 04/17/23 0500 04/18/23 0500  Weight: (!) 139 kg (!) 139.2 kg (!) 138 kg    Examination:  General exam: Appears in no acute distress  respiratory system: Coarse to auscultation. Respiratory effort normal. Cardiovascular system: S1 & S2 heard, RRR. No JVD, murmurs, rubs, gallops or clicks. No pedal edema. Gastrointestinal system: Abdomen is nondistended, soft and nontender. No organomegaly or masses felt. Normal bowel sounds heard. Central nervous system: Alert and oriented. Extremities: Trace edema   Data Reviewed: I have personally reviewed following labs and imaging studies  CBC: Recent Labs  Lab 04/19/23 1745  WBC 13.0*  NEUTROABS 9.0*  HGB 13.4  HCT 41.3  MCV 92.6  PLT 361   Basic Metabolic Panel: Recent Labs  Lab 04/14/23 0507 04/15/23 0523 04/16/23 0451 04/17/23 0509 04/18/23 0458  NA 132* 134* 132* 131* 133*  K 3.8 4.8 3.9 3.9 3.7  CL 96* 101 98 97* 100  CO2 25 25 27 26 25   GLUCOSE 145* 180* 155* 152* 93  BUN 25* 19 18 17 20   CREATININE 0.96 0.86 0.78 0.83 0.89  CALCIUM 8.4* 8.4* 8.3* 8.3* 8.2*  MG 2.3 2.1 2.1 2.0 2.1   GFR: Estimated Creatinine Clearance: 112.5 mL/min (by C-G formula based on SCr of 0.89 mg/dL). Liver Function Tests: No results for input(s): "AST", "ALT", "ALKPHOS", "BILITOT", "PROT", "ALBUMIN" in the last 168 hours. No results for input(s): "LIPASE", "AMYLASE" in the last 168 hours. No results for input(s): "AMMONIA"  in the last 168 hours. Coagulation Profile: Recent Labs  Lab 04/19/23 1745  INR 0.9   Cardiac Enzymes: No results for input(s): "CKTOTAL", "CKMB", "CKMBINDEX", "TROPONINI" in the last 168 hours. BNP (last 3 results) No results for input(s): "PROBNP" in the last 8760 hours. HbA1C: No results for input(s): "HGBA1C" in the last 72 hours. CBG: Recent Labs  Lab 04/19/23 1144 04/19/23 1644 04/19/23 2135 04/20/23 0717 04/20/23 1134  GLUCAP 109* 190* 177* 151* 176*   Lipid Profile: No results for input(s): "CHOL", "HDL", "LDLCALC", "TRIG", "CHOLHDL", "LDLDIRECT" in the last 72 hours. Thyroid Function Tests: No results for input(s): "TSH", "T4TOTAL", "FREET4", "T3FREE", "THYROIDAB" in the last 72 hours. Anemia Panel: No results for input(s): "VITAMINB12", "FOLATE", "FERRITIN", "TIBC", "IRON", "RETICCTPCT" in the last 72 hours. Sepsis Labs: No results for input(s): "PROCALCITON", "LATICACIDVEN" in the last 168 hours.  Recent Results (from the past 240 hours)  Expectorated  Sputum Assessment w Gram Stain, Rflx to Resp Cult     Status: None   Collection Time: 04/12/23  3:47 AM   Specimen: Expectorated Sputum  Result Value Ref Range Status   Specimen Description EXPECTORATED SPUTUM  Final   Special Requests NONE  Final   Sputum evaluation   Final    THIS SPECIMEN IS ACCEPTABLE FOR SPUTUM CULTURE Performed at [facility], [address] [place], [place] [address]    Report Status 04/12/2023 FINAL  Final  Culture, Respiratory w Gram Stain     Status: None   Collection Time: 04/12/23  3:47 AM  Result Value Ref Range Status   Specimen Description   Final    EXPECTORATED SPUTUM Performed at [facility], [address], [place], [place] [address]    Special Requests   Final    NONE Reflexed from [id number] Performed at [facility], [address] [place], [place] [address]    Gram Stain NO WBC SEEN RARE GRAM POSITIVE  COCCI   Final   Culture   Final    Normal respiratory flora-no Staph aureus or Pseudomonas seen Performed at [facility], [address], [place], [place] [address]    Report Status 04/14/2023 FINAL  Final  Culture, BAL-quantitative w Gram Stain     Status: None   Collection Time: 04/17/23  1:11 PM   Specimen: Bronchial Alveolar Lavage; Respiratory  Result Value Ref Range Status   Specimen Description BRONCHIAL ALVEOLAR LAVAGE  Final   Special Requests LUL  Final   Gram Stain   Final    RARE WBC PRESENT, PREDOMINANTLY PMN NO ORGANISMS SEEN    Culture   Final    NO GROWTH < 24 HOURS Performed at [facility], [address], [place], [place] [address]    Report Status 04/19/2023 FINAL  Final  Aerobic/Anaerobic Culture w Gram Stain (surgical/deep wound)     Status: None (Preliminary result)   Collection Time: 04/17/23  1:11 PM   Specimen: Bronchial Alveolar Lavage; Respiratory  Result Value Ref Range Status   Specimen Description BRONCHIAL ALVEOLAR LAVAGE  Final   Special Requests LUL  Final   Gram Stain   Final    RARE WBC PRESENT, PREDOMINANTLY PMN NO ORGANISMS SEEN Performed at [facility], [address], [place], [place] [address]    Culture   Final    RARE Normal respiratory flora-no Staph aureus or Pseudomonas seen NO ANAEROBES ISOLATED; CULTURE IN PROGRESS FOR 5 DAYS    Report Status PENDING  Incomplete  Acid Fast Smear (AFB)     Status: None   Collection Time: 04/17/23  1:11 PM   Specimen: Bronchial Alveolar Lavage; Respiratory  Result Value Ref Range Status   AFB Specimen Processing Concentration  Final   Acid Fast Smear Negative  Final    Comment: (NOTE) Performed At: [facility] [address] [place], [place] [id number] Jolene Schimke MD QV:[id number]    Source (AFB) LEFT UPPER LOBE, BAL  Final    Comment: Performed at [facility], [address] [place], [place] [address]         Radiology Studies: No results  found.      Scheduled Meds:  arformoterol  15 mcg Nebulization BID   ascorbic acid  500 mg Oral Daily   azelastine  2 spray Each Nare BID   budesonide (PULMICORT) nebulizer solution  0.25 mg Nebulization BID  buPROPion  300 mg Oral q morning   busPIRone  15 mg Oral Daily   cholecalciferol  1,000 Units Oral Daily   diltiazem  180 mg Oral Daily   docusate sodium  200 mg Oral BID   DULoxetine  60 mg Oral BID   folic acid  1 mg Oral Daily   gabapentin  100 mg Oral TID   guaiFENesin  1,200 mg Oral BID   insulin aspart  0-5 Units Subcutaneous QHS   insulin aspart  0-9 Units Subcutaneous TID WC   insulin aspart  3 Units Subcutaneous TID WC   insulin glargine-yfgn  5 Units Subcutaneous BID   ipratropium-albuterol  3 mL Nebulization Q6H   magnesium oxide  200 mg Oral Daily   melatonin  3 mg Oral QHS   multivitamin with minerals  1 tablet Oral Daily   Oxcarbazepine  600 mg Oral TID   pantoprazole  40 mg Oral BID   polyethylene glycol  17 g Oral TID   revefenacin  175 mcg Nebulization Daily   sodium chloride  2 spray Each Nare BID   tamsulosin  0.8 mg Oral Daily   traZODone  75 mg Oral QHS   Continuous Infusions:     LOS: 16 days   Alwyn Ren, MD 04/20/2023, 1:13 PM

## 2023-04-20 NOTE — Progress Notes (Signed)
Patient's most recent blood pressure was 189/100, RN made MD aware and asked for orders to correct patient's high blood pressure, awaiting MD response.

## 2023-04-20 NOTE — Progress Notes (Signed)
RN rechecked patient's blood pressure after administering ordered beta blocker by MD, blood pressure came down to 179/95, MD updated.

## 2023-04-20 NOTE — Progress Notes (Signed)
RN administered IV Hydralazine ordered by MD, RN rechecked patient's blood pressure, patient's blood pressure now 150/87, RN made MD aware of situation.

## 2023-04-20 NOTE — Progress Notes (Signed)
Pt refused chest vest 

## 2023-04-21 ENCOUNTER — Inpatient Hospital Stay (HOSPITAL_COMMUNITY): Payer: No Typology Code available for payment source

## 2023-04-21 LAB — GLUCOSE, CAPILLARY
Glucose-Capillary: 132 mg/dL — ABNORMAL HIGH (ref 70–99)
Glucose-Capillary: 144 mg/dL — ABNORMAL HIGH (ref 70–99)
Glucose-Capillary: 100 mg/dL — ABNORMAL HIGH (ref 70–99)
Glucose-Capillary: 141 mg/dL — ABNORMAL HIGH (ref 70–99)
Glucose-Capillary: 91 mg/dL (ref 70–99)

## 2023-04-21 MED ORDER — HYDRALAZINE HCL 25 MG PO TABS
25.0000 mg | ORAL_TABLET | Freq: Three times a day (TID) | ORAL | Status: DC
  Administered 2023-04-21 – 2023-04-24 (×9): 25 mg via ORAL
  Filled 2023-04-21 (×9): qty 1

## 2023-04-21 NOTE — Progress Notes (Signed)
Pt refused all breathing treatments and vest therapy. Pt stating he is to nauseas at this time.

## 2023-04-21 NOTE — TOC Progression Note (Signed)
Transition of Care [facility]) - Progression Note    Patient Details  Name: Mario [name] MRN: [id number] Date of Birth: [birth year]  Transition of Care [facility]) CM/SW Contact  Bayleigh Loflin, Olegario Messier, RN Phone Number: 04/21/2023, 12:46 PM  Clinical Narrative:Patient already has home 02 & travel tank;[employer] help[employer] already provide aide service contact Thayer Ohm woods prior to d/c so he can have asst @ home [phone];patient will have own transport home.[facility] rep Clifton Custard for HHPT(patient requested).       Expected Discharge Plan: Home w Home Health Services Barriers to Discharge: (P) Continued Medical Work up  Expected Discharge Plan and Services     Post Acute Care Choice: (P) Home Health Living arrangements for the past 2 months: (P) Apartment                           HH Arranged: PT HH Agency: [facility] Date HH Agency Contacted: 04/21/23 Time HH Agency Contacted: 1246 Representative spoke with at [facility] Agency: Clifton Custard   Social Determinants of Health (SDOH) Interventions SDOH Screenings   Food Insecurity: No Food Insecurity (04/02/2023)  Housing: Low Risk  (04/02/2023)  Transportation Needs: No Transportation Needs (04/02/2023)  Utilities: Not At Risk (04/02/2023)  Financial Resource Strain: Low Risk  (09/23/2020)  Social Connections: Socially Isolated (04/02/2023)  Stress: No Stress Concern Present (09/23/2020)  Tobacco Use: Medium Risk (04/01/2023)    Readmission Risk Interventions    03/25/2023    2:23 PM 11/03/2021   11:50 AM 08/23/2021   11:48 AM  Readmission Risk Prevention Plan  Transportation Screening Complete Complete Complete  Medication Review Oceanographer) Complete Complete Complete  PCP or Specialist appointment within 3-5 days of discharge  Complete Complete  HRI or Home Care Consult Complete Complete Complete  SW Recovery Care/Counseling Consult Complete Complete Complete  Palliative Care Screening Not Applicable Not Applicable Not Applicable  Skilled  Nursing Facility Not Applicable Not Applicable Not Applicable

## 2023-04-21 NOTE — Progress Notes (Signed)
PROGRESS NOTE    Mario [name]  ZOX:[id number] DOB: 1951/02/22 DOA: 04/01/2023 PCP: [name]   Brief Narrative: 73 y.o. 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  Came back to the ED with a white count of 13 BNP of 21 chest x-ray showed right basilar infiltrate with persistent airspace disease.  Due to ongoing shortness of breath hemoptysis a CT of the chest was done that showed large left lower lobe infiltrate some pulmonary fibrosis mild loculated fluid collection pulmonary and critical care was consulted and related that the limited endobronchial due to his oxygen requirement.  He was continued on empiric antibiotics and diuretics.  He continues to have hemoptysis despite 1 week of anticoagulation interruption.   Assessment & Plan:   Principal Problem:   COPD with acute bronchitis (HCC) Active Problems:   Chronic respiratory failure with hypoxia, on home O2 therapy (HCC) - 4 L/min   DM2 (diabetes mellitus, type 2) (HCC)   History of pulmonary embolus (PE)   BPH (benign prostatic hyperplasia)   OSA (obstructive sleep apnea)   Hypertension associated with diabetes (HCC)   COPD exacerbation (HCC)   Hemoptysis   Epistaxis   Acute respiratory failure with hypoxia in a patient with COPD on 5 L of oxygen dependent with a recent pneumonia and RSV infection readmitted and found to have hemoptysis while on DOAC:  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  Patient with known history of lung malignancy in both lungs.  He has stage IV lung CA per PCCM there is nothing more they can offer.  Patient will follow-up with his outpatient oncologist. He is status post TXA treatments, IR was unable to  embolize, status post bronch He was continue on inhalers and steroid taper.  Will need complete antibiotics for 7 days.  He initially his antibiotics on 04/19/2023.  Continue incentive spirometry and flutter valve out of bed to chair. Status post bronchoscopy on 04/17/2023 showed focal area of granulation tissue that was biopsy as it was oozing cauterized BAL performed.   History of PE: Was on Eliquis prior to admission discontinued due to hemoptysis. Will need to be off Eliquis indefinitely. CT angio of the chest on 03/17/2023 was negative for PE.   History of lung cancer: Status post surgery.   Diabetes mellitus type 2: He is on low-dose steroids which will make his blood glucose erratic, continue sliding scale insulin CBG (last 3)  Recent Labs    04/20/23 1617 04/20/23 2216 04/21/23 0730  GLUCAP 137* 161* 132*   A1C 6.07 Mar 2023   Essential hypertension: Continue diltiazem. Hydralazine prn   BPH: Continue Flomax.   General anxiety disorder: Continue bupropion, BuSpar, Cymbalta, Trileptal and trazodone.   Hypovolemic hyponatremia:improved na 133   sleep apnea: Continue CPAP at night.   Constipation: Continue MiraLAX.   Epistaxis-seen by Dr. Allena Katz nasal pack left nostril placed to remain in place for 4 to 5 days, continue the oxygen with facemask aerosolized, continue Afrin nasal spray every 8 hours for 3 days and nasal saline spray every 4 hours  Estimated body mass index is 39.06 kg/m as calculated from the following:   Height as of this encounter: 6\' 2"  (1.88 m).  Weight as of this encounter: 138 kg.  DVT prophylaxis: Lovenox  code Status: Full  family Communication: None  disposition Plan:  Status is: Inpatient Remains inpatient appropriate because: Inpatient   Consultants:  PCCM  Procedures: Bronch Antimicrobials: Vanco mycin and Zosyn  Subjective: Does not want to go home worried he is not or cannot take care of himself with a nasal packing in the  oxygen and climbing stairs at home he has no family He wants to get the packing out of his nose out complains of throbbing left sided facial pain  Objective: Vitals:   04/20/23 1616 04/20/23 1740 04/20/23 2219 04/21/23 0545  BP: (!) 185/104 (!) 150/87 135/63 (!) 155/73  Pulse: 91  88 89  Resp: 19   18  Temp: (!) 97.5 F (36.4 C)  98.6 F (37 C) 98.9 F (37.2 C)  TempSrc: Axillary  Oral Oral  SpO2: 97%  96% 100%  Weight:      Height:        Intake/Output Summary (Last 24 hours) at 04/21/2023 2956 Last data filed at 04/21/2023 0615 Gross per 24 hour  Intake 1560 ml  Output 3850 ml  Net -2290 ml   Filed Weights   04/16/23 0527 04/17/23 0500 04/18/23 0500  Weight: (!) 139 kg (!) 139.2 kg (!) 138 kg    Examination:  General exam: Appears in no acute distress  respiratory system: Coarse to auscultation. Respiratory effort normal. Cardiovascular system: S1 & S2 heard, RRR. No JVD, murmurs, rubs, gallops or clicks. No pedal edema. Gastrointestinal system: Abdomen is nondistended, soft and nontender. No organomegaly or masses felt. Normal bowel sounds heard. Central nervous system: Alert and oriented. Extremities: Trace edema   Data Reviewed: I have personally reviewed following labs and imaging studies  CBC: Recent Labs  Lab 04/19/23 1745  WBC 13.0*  NEUTROABS 9.0*  HGB 13.4  HCT 41.3  MCV 92.6  PLT 361   Basic Metabolic Panel: Recent Labs  Lab 04/15/23 0523 04/16/23 0451 04/17/23 0509 04/18/23 0458  NA 134* 132* 131* 133*  K 4.8 3.9 3.9 3.7  CL 101 98 97* 100  CO2 25 27 26 25   GLUCOSE 180* 155* 152* 93  BUN 19 18 17 20   CREATININE 0.86 0.78 0.83 0.89  CALCIUM 8.4* 8.3* 8.3* 8.2*  MG 2.1 2.1 2.0 2.1   GFR: Estimated Creatinine Clearance: 112.5 mL/min (by C-G formula based on SCr of 0.89 mg/dL). Liver Function Tests: No results for input(s): "AST", "ALT", "ALKPHOS", "BILITOT", "PROT", "ALBUMIN" in the last 168 hours. No results for input(s): "LIPASE",  "AMYLASE" in the last 168 hours. No results for input(s): "AMMONIA" in the last 168 hours. Coagulation Profile: Recent Labs  Lab 04/19/23 1745  INR 0.9   Cardiac Enzymes: No results for input(s): "CKTOTAL", "CKMB", "CKMBINDEX", "TROPONINI" in the last 168 hours. BNP (last 3 results) No results for input(s): "PROBNP" in the last 8760 hours. HbA1C: No results for input(s): "HGBA1C" in the last 72 hours. CBG: Recent Labs  Lab 04/20/23 0717 04/20/23 1134 04/20/23 1617 04/20/23 2216 04/21/23 0730  GLUCAP 151* 176* 137* 161* 132*   Lipid Profile: No results for input(s): "CHOL", "HDL", "LDLCALC", "TRIG", "CHOLHDL", "LDLDIRECT" in the last 72 hours. Thyroid Function Tests: No results for input(s): "TSH", "T4TOTAL", "FREET4", "T3FREE", "THYROIDAB" in the last 72 hours. Anemia Panel: No results for input(s): "VITAMINB12", "FOLATE", "FERRITIN", "TIBC", "IRON", "RETICCTPCT" in the last 72 hours. Sepsis Labs: No results for input(s): "PROCALCITON", "LATICACIDVEN" in the last 168 hours.  Recent Results (from the past 240 hours)  Expectorated Sputum Assessment w Gram Stain, Rflx to Resp Cult     Status: None   Collection Time: 04/12/23  3:47 AM   Specimen: Expectorated Sputum  Result Value Ref Range Status   Specimen Description EXPECTORATED SPUTUM  Final   Special Requests NONE  Final   Sputum evaluation   Final    THIS SPECIMEN IS ACCEPTABLE FOR SPUTUM CULTURE Performed at [facility], [address] [place], [place] [address]    Report Status 04/12/2023 FINAL  Final  Culture, Respiratory w Gram Stain     Status: None   Collection Time: 04/12/23  3:47 AM  Result Value Ref Range Status   Specimen Description   Final    EXPECTORATED SPUTUM Performed at [facility], [address] [place], [place] [address]    Special Requests   Final    NONE Reflexed from [id number] Performed at [facility], [address]  [place], [place] [address]    Gram Stain NO WBC SEEN RARE GRAM POSITIVE COCCI   Final   Culture   Final    Normal respiratory flora-no Staph aureus or Pseudomonas seen Performed at [facility], [address], [place], [place] [address]    Report Status 04/14/2023 FINAL  Final  Culture, BAL-quantitative w Gram Stain     Status: None   Collection Time: 04/17/23  1:11 PM   Specimen: Bronchial Alveolar Lavage; Respiratory  Result Value Ref Range Status   Specimen Description BRONCHIAL ALVEOLAR LAVAGE  Final   Special Requests LUL  Final   Gram Stain   Final    RARE WBC PRESENT, PREDOMINANTLY PMN NO ORGANISMS SEEN    Culture   Final    NO GROWTH < 24 HOURS Performed at [facility], [address], [place], [place] [address]    Report Status 04/19/2023 FINAL  Final  Aerobic/Anaerobic Culture w Gram Stain (surgical/deep wound)     Status: None (Preliminary result)   Collection Time: 04/17/23  1:11 PM   Specimen: Bronchial Alveolar Lavage; Respiratory  Result Value Ref Range Status   Specimen Description BRONCHIAL ALVEOLAR LAVAGE  Final   Special Requests LUL  Final   Gram Stain   Final    RARE WBC PRESENT, PREDOMINANTLY PMN NO ORGANISMS SEEN Performed at [facility], [address] [place], [place] [address]    Culture   Final    RARE Normal respiratory flora-no Staph aureus or Pseudomonas seen NO ANAEROBES ISOLATED; CULTURE IN PROGRESS FOR 5 DAYS    Report Status PENDING  Incomplete  Acid Fast Smear (AFB)     Status: None   Collection Time: 04/17/23  1:11 PM   Specimen: Bronchial Alveolar Lavage; Respiratory  Result Value Ref Range Status   AFB Specimen Processing Concentration  Final   Acid Fast Smear Negative  Final    Comment: (NOTE) Performed At: [facility] [address] [place], [place] [id number] Jolene Schimke MD KG:[id number]    Source (AFB) LEFT UPPER LOBE, BAL  Final    Comment: Performed at [facility], [address]  [place], [place] [address]         Radiology Studies: No results found.      Scheduled Meds:  arformoterol  15 mcg Nebulization BID   ascorbic acid  500 mg Oral Daily   azelastine  2 spray Each Nare BID   budesonide (  PULMICORT) nebulizer solution  0.25 mg Nebulization BID   buPROPion  300 mg Oral q morning   busPIRone  15 mg Oral Daily   cholecalciferol  1,000 Units Oral Daily   diltiazem  180 mg Oral Daily   docusate sodium  200 mg Oral BID   DULoxetine  60 mg Oral BID   folic acid  1 mg Oral Daily   gabapentin  100 mg Oral TID   guaiFENesin  1,200 mg Oral BID   insulin aspart  0-5 Units Subcutaneous QHS   insulin aspart  0-9 Units Subcutaneous TID WC   insulin aspart  3 Units Subcutaneous TID WC   insulin glargine-yfgn  5 Units Subcutaneous BID   ipratropium-albuterol  3 mL Nebulization BID   magnesium oxide  200 mg Oral Daily   melatonin  3 mg Oral QHS   multivitamin with minerals  1 tablet Oral Daily   Oxcarbazepine  600 mg Oral TID   oxymetazoline  3 spray Each Nare Q8H   pantoprazole  40 mg Oral BID   polyethylene glycol  17 g Oral TID   revefenacin  175 mcg Nebulization Daily   sodium chloride  2 spray Each Nare BID   tamsulosin  0.8 mg Oral Daily   traZODone  75 mg Oral QHS   Continuous Infusions:     LOS: 17 days   Alwyn Ren, MD 04/21/2023, 9:23 AM

## 2023-04-22 LAB — GLUCOSE, CAPILLARY
Glucose-Capillary: 97 mg/dL (ref 70–99)
Glucose-Capillary: 96 mg/dL (ref 70–99)
Glucose-Capillary: 157 mg/dL — ABNORMAL HIGH (ref 70–99)
Glucose-Capillary: 135 mg/dL — ABNORMAL HIGH (ref 70–99)

## 2023-04-22 LAB — AEROBIC/ANAEROBIC CULTURE W GRAM STAIN (SURGICAL/DEEP WOUND): Culture: NORMAL

## 2023-04-22 MED ORDER — BISACODYL 5 MG PO TBEC
10.0000 mg | DELAYED_RELEASE_TABLET | Freq: Every day | ORAL | Status: DC
  Administered 2023-04-22 – 2023-04-23 (×2): 10 mg via ORAL
  Filled 2023-04-22 (×3): qty 2

## 2023-04-22 NOTE — Progress Notes (Signed)
 PT refused scheduled CPT (nausea).

## 2023-04-22 NOTE — Plan of Care (Signed)
  Problem: Education: Goal: Ability to describe self-care measures that may prevent or decrease complications (Diabetes Survival Skills Education) will improve Outcome: Progressing Goal: Individualized Educational Video(s) Outcome: Progressing   Problem: Coping: Goal: Ability to adjust to condition or change in health will improve Outcome: Progressing   Problem: Fluid Volume: Goal: Ability to maintain a balanced intake and output will improve Outcome: Progressing   Problem: Health Behavior/Discharge Planning: Goal: Ability to identify and utilize available resources and services will improve Outcome: Progressing Goal: Ability to manage health-related needs will improve Outcome: Progressing   Problem: Nutritional: Goal: Progress toward achieving an optimal weight will improve Outcome: Progressing   Problem: Skin Integrity: Goal: Risk for impaired skin integrity will decrease Outcome: Progressing   Problem: Tissue Perfusion: Goal: Adequacy of tissue perfusion will improve Outcome: Progressing   Problem: Education: Goal: Knowledge of General Education information will improve Description: Including pain rating scale, medication(s)/side effects and non-pharmacologic comfort measures Outcome: Progressing   Problem: Health Behavior/Discharge Planning: Goal: Ability to manage health-related needs will improve Outcome: Progressing   Problem: Clinical Measurements: Goal: Ability to maintain clinical measurements within normal limits will improve Outcome: Progressing Goal: Will remain free from infection Outcome: Progressing Goal: Diagnostic test results will improve Outcome: Progressing Goal: Respiratory complications will improve Outcome: Progressing Goal: Cardiovascular complication will be avoided Outcome: Progressing   Problem: Activity: Goal: Risk for activity intolerance will decrease Outcome: Progressing   Problem: Nutrition: Goal: Adequate nutrition will be  maintained Outcome: Progressing   Problem: Coping: Goal: Level of anxiety will decrease Outcome: Progressing   Problem: Elimination: Goal: Will not experience complications related to bowel motility Outcome: Progressing Goal: Will not experience complications related to urinary retention Outcome: Progressing   Problem: Pain Managment: Goal: General experience of comfort will improve and/or be controlled Outcome: Progressing   Problem: Safety: Goal: Ability to remain free from injury will improve Outcome: Progressing   Problem: Skin Integrity: Goal: Risk for impaired skin integrity will decrease Outcome: Progressing

## 2023-04-22 NOTE — Progress Notes (Signed)
 PROGRESS NOTE    Mario [name]  EAV:[id number] DOB: 06/08/1950 DOA: 04/01/2023 PCP: [name]   Brief Narrative: 73 y.o. 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  Came back to the ED with a white count of 13 BNP of 21 chest x-ray showed right basilar infiltrate with persistent airspace disease.  Due to ongoing shortness of breath hemoptysis a CT of the chest was done that showed large left lower lobe infiltrate some pulmonary fibrosis mild loculated fluid collection pulmonary and critical care was consulted and related that the limited endobronchial due to his oxygen requirement.  He was continued on empiric antibiotics and diuretics.  He continues to have hemoptysis despite 1 week of anticoagulation interruption.   Assessment & Plan:   Principal Problem:   COPD with acute bronchitis (HCC) Active Problems:   Chronic respiratory failure with hypoxia, on home O2 therapy (HCC) - 4 L/min   DM2 (diabetes mellitus, type 2) (HCC)   History of pulmonary embolus (PE)   BPH (benign prostatic hyperplasia)   OSA (obstructive sleep apnea)   Hypertension associated with diabetes (HCC)   COPD exacerbation (HCC)   Hemoptysis   Epistaxis   Acute respiratory failure with hypoxia in a patient with COPD on 5 L of oxygen dependent with a recent pneumonia and RSV infection readmitted and found to have hemoptysis while on DOAC:  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  Patient with known history of lung malignancy in both lungs.  He has stage IV lung CA per PCCM there is nothing more they can offer.  Patient will follow-up with his outpatient oncologist. He is status post TXA treatments, IR was unable to  embolize, status post bronch He was continue on inhalers and steroid taper.  Will need complete antibiotics for 7 days.  He initially his antibiotics on 04/19/2023.  Continue incentive spirometry and flutter valve out of bed to chair. Status post bronchoscopy on 04/17/2023 showed focal area of granulation tissue that was biopsy as it was oozing cauterized BAL performed.   History of PE: Was on Eliquis prior to admission discontinued due to hemoptysis. Will need to be off Eliquis indefinitely. CT angio of the chest on 03/17/2023 was negative for PE.   History of lung cancer: Status post surgery.  [facility]   Diabetes mellitus type 2: He is on low-dose steroids which will make his blood glucose erratic, continue sliding scale insulin CBG (last 3)  Recent Labs    04/21/23 2118 04/22/23 0759 04/22/23 1153  GLUCAP 91 97 96   A1C 6.07 Mar 2023   Essential hypertension: Continue diltiazem. Hydralazine prn   BPH: Continue Flomax.   General anxiety disorder: Continue bupropion, BuSpar, Cymbalta, Trileptal and trazodone.   Hypovolemic hyponatremia:improved na 133   sleep apnea: Continue CPAP at night.   Constipation: Continue MiraLAX.   Epistaxis-seen by Dr. Allena Katz nasal pack left nostril placed to remain in place for 4 to 5 days, continue the oxygen with facemask aerosolized, continue Afrin nasal spray every 8 hours for 3 days and nasal saline spray every 4 hours  Estimated body mass index is 39.06 kg/m as calculated from the following:   Height as of this encounter: 6'  2" (1.88 m).   Weight as of this encounter: 138 kg.  DVT prophylaxis: Lovenox  code Status: Full  family Communication: None  disposition Plan:  Status is: Inpatient Remains inpatient appropriate because: Inpatient   Consultants:  PCCM  Procedures: Bronch Antimicrobials: Vanco mycin and Zosyn  Subjective:  Sinus x-rays shows air-fluid level in the left maxillary sinus likely layering  blood products mucosal thickening versus fluid in the inferior left frontal sinus No new c/o  Objective: Vitals:   04/21/23 1337 04/21/23 2116 04/22/23 0553 04/22/23 0948  BP: (!) 153/76 (!) 149/77 (!) 145/71   Pulse: (!) 105 92 87   Resp: 18 18 18    Temp: 97.7 F (36.5 C) 98.4 F (36.9 C) 98.6 F (37 C)   TempSrc: Oral Oral    SpO2: 94% 95% 94% 93%  Weight:      Height:        Intake/Output Summary (Last 24 hours) at 04/22/2023 1318 Last data filed at 04/22/2023 0948 Gross per 24 hour  Intake 660 ml  Output 2150 ml  Net -1490 ml   Filed Weights   04/16/23 0527 04/17/23 0500 04/18/23 0500  Weight: (!) 139 kg (!) 139.2 kg (!) 138 kg    Examination:  General exam: Appears in no acute distress  respiratory system: Coarse to auscultation. Respiratory effort normal. Cardiovascular system: S1 & S2 heard, RRR. No JVD, murmurs, rubs, gallops or clicks. No pedal edema. Gastrointestinal system: Abdomen is nondistended, soft and nontender. No organomegaly or masses felt. Normal bowel sounds heard. Central nervous system: Alert and oriented. Extremities: Trace edema   Data Reviewed: I have personally reviewed following labs and imaging studies  CBC: Recent Labs  Lab 04/19/23 1745  WBC 13.0*  NEUTROABS 9.0*  HGB 13.4  HCT 41.3  MCV 92.6  PLT 361   Basic Metabolic Panel: Recent Labs  Lab 04/16/23 0451 04/17/23 0509 04/18/23 0458  NA 132* 131* 133*  K 3.9 3.9 3.7  CL 98 97* 100  CO2 27 26 25   GLUCOSE 155* 152* 93  BUN 18 17 20   CREATININE 0.78 0.83 0.89  CALCIUM 8.3* 8.3* 8.2*  MG 2.1 2.0 2.1   GFR: Estimated Creatinine Clearance: 112.5 mL/min (by C-G formula based on SCr of 0.89 mg/dL). Liver Function Tests: No results for input(s): "AST", "ALT", "ALKPHOS", "BILITOT", "PROT", "ALBUMIN" in the last 168 hours. No results for input(s): "LIPASE", "AMYLASE" in the last 168 hours. No results for input(s): "AMMONIA" in the last 168 hours. Coagulation  Profile: Recent Labs  Lab 04/19/23 1745  INR 0.9   Cardiac Enzymes: No results for input(s): "CKTOTAL", "CKMB", "CKMBINDEX", "TROPONINI" in the last 168 hours. BNP (last 3 results) No results for input(s): "PROBNP" in the last 8760 hours. HbA1C: No results for input(s): "HGBA1C" in the last 72 hours. CBG: Recent Labs  Lab 04/21/23 1528 04/21/23 1647 04/21/23 2118 04/22/23 0759 04/22/23 1153  GLUCAP 100* 141* 91 97 96   Lipid Profile: No results for input(s): "CHOL", "HDL", "LDLCALC", "TRIG", "CHOLHDL", "LDLDIRECT" in the last 72 hours. Thyroid Function Tests: No results for input(s): "TSH", "T4TOTAL", "FREET4", "T3FREE", "THYROIDAB" in the last 72 hours. Anemia Panel: No results for input(s): "VITAMINB12", "FOLATE", "FERRITIN", "TIBC", "IRON", "RETICCTPCT" in the last 72 hours. Sepsis Labs: No results for input(s): "PROCALCITON", "LATICACIDVEN" in the last 168 hours.  Recent Results (from the past 240 hours)  Fungus Culture With Stain     Status: None (Preliminary result)   Collection Time: 04/17/23  1:11 PM  Specimen: Bronchial Alveolar Lavage; Respiratory  Result Value Ref Range Status   Fungus Stain Final report  Final    Comment: (NOTE) Performed At: [facility] [address] [place], [place] [id number] Jolene Schimke MD WU:[id number]    Fungus (Mycology) Culture PENDING  Incomplete   Fungal Source LEFT UPPER LOBE, BAL  Final    Comment: Performed at [facility], [address] [place], [place] [address]  Culture, BAL-quantitative w Gram Stain     Status: None   Collection Time: 04/17/23  1:11 PM   Specimen: Bronchial Alveolar Lavage; Respiratory  Result Value Ref Range Status   Specimen Description BRONCHIAL ALVEOLAR LAVAGE  Final   Special Requests LUL  Final   Gram Stain   Final    RARE WBC PRESENT, PREDOMINANTLY PMN NO ORGANISMS SEEN    Culture   Final    NO GROWTH < 24 HOURS Performed at [facility], [address]  [place], [place] [address]    Report Status 04/19/2023 FINAL  Final  Aerobic/Anaerobic Culture w Gram Stain (surgical/deep wound)     Status: None   Collection Time: 04/17/23  1:11 PM   Specimen: Bronchial Alveolar Lavage; Respiratory  Result Value Ref Range Status   Specimen Description BRONCHIAL ALVEOLAR LAVAGE  Final   Special Requests LUL  Final   Gram Stain   Final    RARE WBC PRESENT, PREDOMINANTLY PMN NO ORGANISMS SEEN    Culture   Final    RARE Normal respiratory flora-no Staph aureus or Pseudomonas seen NO ANAEROBES ISOLATED Performed at [facility], [address], [place], [place] [address]    Report Status 04/22/2023 FINAL  Final  Acid Fast Smear (AFB)     Status: None   Collection Time: 04/17/23  1:11 PM   Specimen: Bronchial Alveolar Lavage; Respiratory  Result Value Ref Range Status   AFB Specimen Processing Concentration  Final   Acid Fast Smear Negative  Final    Comment: (NOTE) Performed At: [facility] [address] [place], [place] [id number] Jolene Schimke MD KG:[id number]    Source (AFB) LEFT UPPER LOBE, BAL  Final    Comment: Performed at [facility], [address], [place], [place] [address]  Fungus Culture Result     Status: None   Collection Time: 04/17/23  1:11 PM  Result Value Ref Range Status   Result 1 Comment  Final    Comment: (NOTE) KOH/Calcofluor preparation:  no fungus observed. Performed At: [facility] [address] [place], [place] [id number] Jolene Schimke MD OV:[id number]          Radiology Studies: DG Sinuses Complete Result Date: 04/21/2023 CLINICAL DATA:  History of epistaxis, bilateral but worse on the left. EXAM: PARANASAL SINUSES - COMPLETE 3 + VIEW four views COMPARISON:  CT head 10/12/2021. FINDINGS: There is suggestion of layering fluid within the left maxillary sinus. Additional fluid versus mucosal thickening in the inferior left frontal sinus. Visualized paranasal sinuses are otherwise  aerated. No evidence of air-fluid level on the right. Tubing overlying the left nasal cavity likely corresponding to nasal mucosal compression device. No significant bone abnormalities are seen. IMPRESSION: Air-fluid level in the left maxillary sinus, likely layering blood products. Mucosal thickening versus fluid in the inferior left frontal sinus. Electronically Signed   By: Emily Filbert M.D.   On: 04/21/2023 13:53        Scheduled Meds:  arformoterol  15 mcg Nebulization BID  ascorbic acid  500 mg Oral Daily   azelastine  2 spray Each Nare BID   budesonide (PULMICORT) nebulizer solution  0.25 mg Nebulization BID   buPROPion  300 mg Oral q morning   busPIRone  15 mg Oral Daily   cholecalciferol  1,000 Units Oral Daily   diltiazem  180 mg Oral Daily   docusate sodium  200 mg Oral BID   DULoxetine  60 mg Oral BID   folic acid  1 mg Oral Daily   gabapentin  100 mg Oral TID   guaiFENesin  1,200 mg Oral BID   hydrALAZINE  25 mg Oral Q8H   insulin aspart  0-5 Units Subcutaneous QHS   insulin aspart  0-9 Units Subcutaneous TID WC   insulin aspart  3 Units Subcutaneous TID WC   insulin glargine-yfgn  5 Units Subcutaneous BID   magnesium oxide  200 mg Oral Daily   melatonin  3 mg Oral QHS   multivitamin with minerals  1 tablet Oral Daily   Oxcarbazepine  600 mg Oral TID   oxymetazoline  3 spray Each Nare Q8H   pantoprazole  40 mg Oral BID   polyethylene glycol  17 g Oral TID   revefenacin  175 mcg Nebulization Daily   sodium chloride  2 spray Each Nare BID   tamsulosin  0.8 mg Oral Daily   traZODone  75 mg Oral QHS   Continuous Infusions:     LOS: 18 days   Alwyn Ren, MD 04/22/2023, 1:18 PM

## 2023-04-22 NOTE — Progress Notes (Signed)
 Mobility Specialist - Progress Note   04/22/23 0948  Oxygen Therapy  SpO2 93 %  O2 Device Simple Mask  O2 Flow Rate (L/min) 93 L/min  Patient Activity (if Appropriate) Ambulating  Mobility  Activity Ambulated independently in hallway  Level of Assistance Independent  Assistive Device None  Distance Ambulated (ft) 80 ft  Activity Response Tolerated well  Mobility Referral Yes  Mobility visit 1 Mobility  Mobility Specialist Start Time (ACUTE ONLY) [id number]  Mobility Specialist Stop Time (ACUTE ONLY) 0945  Mobility Specialist Time Calculation (min) (ACUTE ONLY) 20 min   Pt received in bed and agreeable to mobility. No complaints during session. Pt to bed after session with all needs met.    Pre-mobility: 102 HR, 96% SpO2 During mobility: 111 HR, 93% SpO2 Post-mobility: 100 HR, 97% SPO2  Chief Technology Officer

## 2023-04-22 NOTE — Plan of Care (Signed)
 Problem: Education: Goal: Ability to describe self-care measures that may prevent or decrease complications (Diabetes Survival Skills Education) will improve 04/22/2023 1740 by Sherian Maroon, RN Outcome: Progressing 04/22/2023 1623 by Sherian Maroon, RN Outcome: Progressing Goal: Individualized Educational Video(s) 04/22/2023 1740 by Sherian Maroon, RN Outcome: Progressing 04/22/2023 1623 by Sherian Maroon, RN Outcome: Progressing   Problem: Coping: Goal: Ability to adjust to condition or change in health will improve 04/22/2023 1740 by Sherian Maroon, RN Outcome: Progressing 04/22/2023 1623 by Sherian Maroon, RN Outcome: Progressing   Problem: Fluid Volume: Goal: Ability to maintain a balanced intake and output will improve 04/22/2023 1740 by Sherian Maroon, RN Outcome: Progressing 04/22/2023 1623 by Sherian Maroon, RN Outcome: Progressing   Problem: Health Behavior/Discharge Planning: Goal: Ability to identify and utilize available resources and services will improve 04/22/2023 1740 by Sherian Maroon, RN Outcome: Progressing 04/22/2023 1623 by Sherian Maroon, RN Outcome: Progressing Goal: Ability to manage health-related needs will improve 04/22/2023 1740 by Sherian Maroon, RN Outcome: Progressing 04/22/2023 1623 by Sherian Maroon, RN Outcome: Progressing   Problem: Nutritional: Goal: Progress toward achieving an optimal weight will improve 04/22/2023 1740 by Sherian Maroon, RN Outcome: Progressing 04/22/2023 1623 by Sherian Maroon, RN Outcome: Progressing   Problem: Skin Integrity: Goal: Risk for impaired skin integrity will decrease 04/22/2023 1740 by Sherian Maroon, RN Outcome: Progressing 04/22/2023 1623 by Sherian Maroon, RN Outcome: Progressing   Problem: Tissue Perfusion: Goal: Adequacy of tissue perfusion will improve 04/22/2023 1740 by Sherian Maroon, RN Outcome: Progressing 04/22/2023 1623 by  Sherian Maroon, RN Outcome: Progressing   Problem: Education: Goal: Knowledge of General Education information will improve Description: Including pain rating scale, medication(s)/side effects and non-pharmacologic comfort measures 04/22/2023 1740 by Sherian Maroon, RN Outcome: Progressing 04/22/2023 1623 by Sherian Maroon, RN Outcome: Progressing   Problem: Health Behavior/Discharge Planning: Goal: Ability to manage health-related needs will improve 04/22/2023 1740 by Sherian Maroon, RN Outcome: Progressing 04/22/2023 1623 by Sherian Maroon, RN Outcome: Progressing   Problem: Clinical Measurements: Goal: Ability to maintain clinical measurements within normal limits will improve 04/22/2023 1740 by Sherian Maroon, RN Outcome: Progressing 04/22/2023 1623 by Sherian Maroon, RN Outcome: Progressing Goal: Will remain free from infection 04/22/2023 1740 by Sherian Maroon, RN Outcome: Progressing 04/22/2023 1623 by Sherian Maroon, RN Outcome: Progressing Goal: Diagnostic test results will improve 04/22/2023 1740 by Sherian Maroon, RN Outcome: Progressing 04/22/2023 1623 by Sherian Maroon, RN Outcome: Progressing Goal: Respiratory complications will improve 04/22/2023 1740 by Sherian Maroon, RN Outcome: Progressing 04/22/2023 1623 by Sherian Maroon, RN Outcome: Progressing Goal: Cardiovascular complication will be avoided 04/22/2023 1740 by Sherian Maroon, RN Outcome: Progressing 04/22/2023 1623 by Sherian Maroon, RN Outcome: Progressing   Problem: Activity: Goal: Risk for activity intolerance will decrease 04/22/2023 1740 by Sherian Maroon, RN Outcome: Progressing 04/22/2023 1623 by Sherian Maroon, RN Outcome: Progressing   Problem: Nutrition: Goal: Adequate nutrition will be maintained 04/22/2023 1740 by Sherian Maroon, RN Outcome: Progressing 04/22/2023 1623 by Sherian Maroon, RN Outcome: Progressing    Problem: Coping: Goal: Level of anxiety will decrease 04/22/2023 1740 by Sherian Maroon, RN Outcome: Progressing 04/22/2023 1623 by Sherian Maroon, RN Outcome: Progressing   Problem: Elimination: Goal: Will not experience complications related to bowel motility 04/22/2023 1740 by Sherian Maroon, RN Outcome: Progressing 04/22/2023 1623 by Sherian Maroon, RN  Outcome: Progressing Goal: Will not experience complications related to urinary retention 04/22/2023 1740 by Sherian Maroon, RN Outcome: Progressing 04/22/2023 1623 by Sherian Maroon, RN Outcome: Progressing   Problem: Pain Managment: Goal: General experience of comfort will improve and/or be controlled 04/22/2023 1740 by Sherian Maroon, RN Outcome: Progressing 04/22/2023 1623 by Sherian Maroon, RN Outcome: Progressing   Problem: Safety: Goal: Ability to remain free from injury will improve 04/22/2023 1740 by Sherian Maroon, RN Outcome: Progressing 04/22/2023 1623 by Sherian Maroon, RN Outcome: Progressing   Problem: Skin Integrity: Goal: Risk for impaired skin integrity will decrease 04/22/2023 1740 by Sherian Maroon, RN Outcome: Progressing 04/22/2023 1623 by Sherian Maroon, RN Outcome: Progressing

## 2023-04-23 LAB — CBC
HCT: 40.4 % (ref 39.0–52.0)
Hemoglobin: 12.6 g/dL — ABNORMAL LOW (ref 13.0–17.0)
MCH: 29.4 pg (ref 26.0–34.0)
MCHC: 31.2 g/dL (ref 30.0–36.0)
MCV: 94.4 fL (ref 80.0–100.0)
Platelets: 335 10*3/uL (ref 150–400)
RBC: 4.28 MIL/uL (ref 4.22–5.81)
RDW: 13.1 % (ref 11.5–15.5)
WBC: 10.6 10*3/uL — ABNORMAL HIGH (ref 4.0–10.5)
nRBC: 0 % (ref 0.0–0.2)

## 2023-04-23 LAB — GLUCOSE, CAPILLARY
Glucose-Capillary: 105 mg/dL — ABNORMAL HIGH (ref 70–99)
Glucose-Capillary: 157 mg/dL — ABNORMAL HIGH (ref 70–99)
Glucose-Capillary: 140 mg/dL — ABNORMAL HIGH (ref 70–99)
Glucose-Capillary: 137 mg/dL — ABNORMAL HIGH (ref 70–99)

## 2023-04-23 LAB — COMPREHENSIVE METABOLIC PANEL
ALT: 14 U/L (ref 0–44)
AST: 16 U/L (ref 15–41)
Albumin: 3.2 g/dL — ABNORMAL LOW (ref 3.5–5.0)
Alkaline Phosphatase: 70 U/L (ref 38–126)
Anion gap: 10 (ref 5–15)
BUN: 12 mg/dL (ref 8–23)
CO2: 29 mmol/L (ref 22–32)
Calcium: 8.7 mg/dL — ABNORMAL LOW (ref 8.9–10.3)
Chloride: 97 mmol/L — ABNORMAL LOW (ref 98–111)
Creatinine, Ser: 0.48 mg/dL — ABNORMAL LOW (ref 0.61–1.24)
GFR, Estimated: >60 mL/min (ref >60)
Glucose, Bld: 137 mg/dL — ABNORMAL HIGH (ref 70–99)
Potassium: 4.5 mmol/L (ref 3.5–5.1)
Sodium: 136 mmol/L (ref 135–145)
Total Bilirubin: 0.4 mg/dL (ref 0.0–1.2)
Total Protein: 6.6 g/dL (ref 6.5–8.1)

## 2023-04-23 MED ORDER — ACETAMINOPHEN 500 MG PO TABS
1000.0000 mg | ORAL_TABLET | Freq: Three times a day (TID) | ORAL | Status: DC
Start: 2023-04-23 — End: 2023-04-26
  Administered 2023-04-23 – 2023-04-24 (×4): 1000 mg via ORAL
  Filled 2023-04-23 (×4): qty 2

## 2023-04-23 MED ORDER — IBUPROFEN 200 MG PO TABS
200.0000 mg | ORAL_TABLET | Freq: Once | ORAL | Status: AC | PRN
  Administered 2023-04-23: 200 mg via ORAL
  Filled 2023-04-23: qty 1

## 2023-04-23 MED ORDER — CHLORHEXIDINE GLUCONATE 0.12 % MT SOLN
10.0000 mL | Freq: Four times a day (QID) | OROMUCOSAL | Status: DC
  Administered 2023-04-23 (×4): 10 mL via OROMUCOSAL
  Filled 2023-04-23 (×5): qty 15

## 2023-04-23 NOTE — Plan of Care (Signed)
  Problem: Education: Goal: Ability to describe self-care measures that may prevent or decrease complications (Diabetes Survival Skills Education) will improve Outcome: Progressing Goal: Individualized Educational Video(s) Outcome: Progressing   Problem: Coping: Goal: Ability to adjust to condition or change in health will improve Outcome: Progressing   Problem: Fluid Volume: Goal: Ability to maintain a balanced intake and output will improve Outcome: Progressing   Problem: Health Behavior/Discharge Planning: Goal: Ability to identify and utilize available resources and services will improve Outcome: Progressing Goal: Ability to manage health-related needs will improve Outcome: Progressing   Problem: Nutritional: Goal: Progress toward achieving an optimal weight will improve Outcome: Progressing   Problem: Skin Integrity: Goal: Risk for impaired skin integrity will decrease Outcome: Progressing   Problem: Tissue Perfusion: Goal: Adequacy of tissue perfusion will improve Outcome: Progressing   Problem: Education: Goal: Knowledge of General Education information will improve Description: Including pain rating scale, medication(s)/side effects and non-pharmacologic comfort measures Outcome: Progressing   Problem: Health Behavior/Discharge Planning: Goal: Ability to manage health-related needs will improve Outcome: Progressing   Problem: Clinical Measurements: Goal: Ability to maintain clinical measurements within normal limits will improve Outcome: Progressing Goal: Will remain free from infection Outcome: Progressing Goal: Diagnostic test results will improve Outcome: Progressing Goal: Respiratory complications will improve Outcome: Progressing Goal: Cardiovascular complication will be avoided Outcome: Progressing   Problem: Activity: Goal: Risk for activity intolerance will decrease Outcome: Progressing   Problem: Nutrition: Goal: Adequate nutrition will be  maintained Outcome: Progressing   Problem: Coping: Goal: Level of anxiety will decrease Outcome: Progressing   Problem: Elimination: Goal: Will not experience complications related to bowel motility Outcome: Progressing Goal: Will not experience complications related to urinary retention Outcome: Progressing   Problem: Pain Managment: Goal: General experience of comfort will improve and/or be controlled Outcome: Progressing   Problem: Safety: Goal: Ability to remain free from injury will improve Outcome: Progressing   Problem: Skin Integrity: Goal: Risk for impaired skin integrity will decrease Outcome: Progressing

## 2023-04-23 NOTE — Progress Notes (Signed)
 PROGRESS NOTE    Mario HUGE  [name]:[id number] DOB: 1951-01-14 DOA: 04/01/2023 PCP: [name]   Brief Narrative: 73 y.o. 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  Came back to the ED with a white count of 13 BNP of 21 chest x-ray showed right basilar infiltrate with persistent airspace disease.  Due to ongoing shortness of breath hemoptysis a CT of the chest was done that showed large left lower lobe infiltrate some pulmonary fibrosis mild loculated fluid collection pulmonary and critical care was consulted and related that the limited endobronchial due to his oxygen requirement.  He was continued on empiric antibiotics and diuretics.  He continues to have hemoptysis despite 1 week of anticoagulation interruption.   Assessment & Plan:   Principal Problem:   COPD with acute bronchitis (HCC) Active Problems:   Chronic respiratory failure with hypoxia, on home O2 therapy (HCC) - 4 L/min   DM2 (diabetes mellitus, type 2) (HCC)   History of pulmonary embolus (PE)   BPH (benign prostatic hyperplasia)   OSA (obstructive sleep apnea)   Hypertension associated with diabetes (HCC)   COPD exacerbation (HCC)   Hemoptysis   Epistaxis   Acute respiratory failure with hypoxia in a patient with COPD on 5 L of oxygen dependent with a recent pneumonia and RSV infection readmitted and found to have hemoptysis while on DOAC:  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  Patient with known history of lung malignancy in both lungs.  He has stage IV lung CA per PCCM there is nothing more they can offer.  Patient will follow-up with his outpatient oncologist. He is status post TXA treatments, IR was unable to  embolize, status post bronch He was continue on inhalers and steroid taper.  Will need complete antibiotics for 7 days.  He initially his antibiotics on 04/19/2023.  Continue incentive spirometry and flutter valve out of bed to chair. Status post bronchoscopy on 04/17/2023 showed focal area of granulation tissue that was biopsy as it was oozing cauterized BAL performed.   History of PE: Was on Eliquis prior to admission discontinued due to hemoptysis. Will need to be off Eliquis indefinitely. CT angio of the chest on 03/17/2023 was negative for PE.   History of lung cancer: Status post surgery.  [facility]   Diabetes mellitus type 2: He is on low-dose steroids which will make his blood glucose erratic, continue sliding scale insulin CBG (last 3)  Recent Labs    04/22/23 2102 04/23/23 0739 04/23/23 1134  GLUCAP 135* 105* 157*   A1C 6.07 Mar 2023   Essential hypertension: Continue diltiazem. Hydralazine prn   BPH: Continue Flomax.   General anxiety disorder: Continue bupropion, BuSpar, Cymbalta, Trileptal and trazodone.   Hypovolemic hyponatremia:improved na 133   sleep apnea: Continue CPAP at night.   Constipation: Continue MiraLAX.   Epistaxis-seen by Dr. Allena Katz nasal pack left nostril placed to remain in place for 4 to 5 days, continue the oxygen with facemask aerosolized, continue Afrin nasal spray every 8 hours for 3 days and nasal saline spray every 4 hours  Estimated body mass index is 39.06 kg/m as calculated from the following:   Height as of this encounter: 6'  2" (1.88 m).   Weight as of this encounter: 138 kg.  DVT prophylaxis: Lovenox  code Status: Full  family Communication: None  disposition Plan:  Status is: Inpatient Remains inpatient appropriate because: Inpatient   Consultants:  PCCM  Procedures: Bronch Antimicrobials: Vanco mycin and Zosyn  Subjective:  C/o sore throat and head ache sinus pain Objective: Vitals:   04/22/23  2105 04/22/23 2106 04/23/23 0604 04/23/23 0803  BP: (!) 152/69  120/71   Pulse: 86  81   Resp: 16  18   Temp:  97.8 F (36.6 C) 98 F (36.7 C)   TempSrc:  Oral Oral   SpO2: 100%  100% 96%  Weight:      Height:        Intake/Output Summary (Last 24 hours) at 04/23/2023 1159 Last data filed at 04/23/2023 1149 Gross per 24 hour  Intake 1420 ml  Output 2250 ml  Net -830 ml   Filed Weights   04/16/23 0527 04/17/23 0500 04/18/23 0500  Weight: (!) 139 kg (!) 139.2 kg (!) 138 kg    Examination:  General exam: Appears in no acute distress nasal packing in place respiratory system: Coarse to auscultation. Respiratory effort normal. Cardiovascular system: S1 & S2 heard, RRR. No JVD, murmurs, rubs, gallops or clicks. No pedal edema. Gastrointestinal system: Abdomen is nondistended, soft and nontender. No organomegaly or masses felt. Normal bowel sounds heard. Central nervous system: Alert and oriented. Extremities: Trace edema   Data Reviewed: I have personally reviewed following labs and imaging studies  CBC: Recent Labs  Lab 04/19/23 1745  WBC 13.0*  NEUTROABS 9.0*  HGB 13.4  HCT 41.3  MCV 92.6  PLT 361   Basic Metabolic Panel: Recent Labs  Lab 04/17/23 0509 04/18/23 0458  NA 131* 133*  K 3.9 3.7  CL 97* 100  CO2 26 25  GLUCOSE 152* 93  BUN 17 20  CREATININE 0.83 0.89  CALCIUM 8.3* 8.2*  MG 2.0 2.1   GFR: Estimated Creatinine Clearance: 112.5 mL/min (by C-G formula based on SCr of 0.89 mg/dL). Liver Function Tests: No results for input(s): "AST", "ALT", "ALKPHOS", "BILITOT", "PROT", "ALBUMIN" in the last 168 hours. No results for input(s): "LIPASE", "AMYLASE" in the last 168 hours. No results for input(s): "AMMONIA" in the last 168 hours. Coagulation Profile: Recent Labs  Lab 04/19/23 1745  INR 0.9   Cardiac Enzymes: No results for input(s): "CKTOTAL", "CKMB", "CKMBINDEX", "TROPONINI" in the last 168 hours. BNP (last 3 results) No results for  input(s): "PROBNP" in the last 8760 hours. HbA1C: No results for input(s): "HGBA1C" in the last 72 hours. CBG: Recent Labs  Lab 04/22/23 1153 04/22/23 1657 04/22/23 2102 04/23/23 0739 04/23/23 1134  GLUCAP 96 157* 135* 105* 157*   Lipid Profile: No results for input(s): "CHOL", "HDL", "LDLCALC", "TRIG", "CHOLHDL", "LDLDIRECT" in the last 72 hours. Thyroid Function Tests: No results for input(s): "TSH", "T4TOTAL", "FREET4", "T3FREE", "THYROIDAB" in the last 72 hours. Anemia Panel: No results for input(s): "VITAMINB12", "FOLATE", "FERRITIN", "TIBC", "IRON", "RETICCTPCT" in the last 72 hours. Sepsis Labs: No results for input(s): "PROCALCITON", "LATICACIDVEN" in the last 168 hours.  Recent Results (from the past 240 hours)  Fungus Culture With Stain     Status: None (Preliminary result)   Collection Time: 04/17/23  1:11 PM   Specimen: Bronchial Alveolar Lavage; Respiratory  Result Value Ref Range Status   Fungus Stain Final report  Final    Comment: (NOTE) Performed At: [facility] [address]  [place], [place] [id number] Jolene Schimke MD MW:[id number]    Fungus (Mycology) Culture PENDING  Incomplete   Fungal Source LEFT UPPER LOBE, BAL  Final    Comment: Performed at [facility], [address] [place], [place] [address]  Culture, BAL-quantitative w Gram Stain     Status: None   Collection Time: 04/17/23  1:11 PM   Specimen: Bronchial Alveolar Lavage; Respiratory  Result Value Ref Range Status   Specimen Description BRONCHIAL ALVEOLAR LAVAGE  Final   Special Requests LUL  Final   Gram Stain   Final    RARE WBC PRESENT, PREDOMINANTLY PMN NO ORGANISMS SEEN    Culture   Final    NO GROWTH < 24 HOURS Performed at [facility], [address] [place], [place] [address]    Report Status 04/19/2023 FINAL  Final  Aerobic/Anaerobic Culture w Gram Stain (surgical/deep wound)     Status: None   Collection Time: 04/17/23  1:11 PM   Specimen: Bronchial  Alveolar Lavage; Respiratory  Result Value Ref Range Status   Specimen Description BRONCHIAL ALVEOLAR LAVAGE  Final   Special Requests LUL  Final   Gram Stain   Final    RARE WBC PRESENT, PREDOMINANTLY PMN NO ORGANISMS SEEN    Culture   Final    RARE Normal respiratory flora-no Staph aureus or Pseudomonas seen NO ANAEROBES ISOLATED Performed at [facility], [address], [place], [place] [address]    Report Status 04/22/2023 FINAL  Final  Acid Fast Smear (AFB)     Status: None   Collection Time: 04/17/23  1:11 PM   Specimen: Bronchial Alveolar Lavage; Respiratory  Result Value Ref Range Status   AFB Specimen Processing Concentration  Final   Acid Fast Smear Negative  Final    Comment: (NOTE) Performed At: [facility] [address] [place], [place] [id number] Jolene Schimke MD PP:[id number]    Source (AFB) LEFT UPPER LOBE, BAL  Final    Comment: Performed at [facility], [address], [place], [place] [address]  Fungus Culture Result     Status: None   Collection Time: 04/17/23  1:11 PM  Result Value Ref Range Status   Result 1 Comment  Final    Comment: (NOTE) KOH/Calcofluor preparation:  no fungus observed. Performed At: [facility] [address] [place], [place] [id number] Jolene Schimke MD TD:[id number]          Radiology Studies: DG Sinuses Complete Result Date: 04/21/2023 CLINICAL DATA:  History of epistaxis, bilateral but worse on the left. EXAM: PARANASAL SINUSES - COMPLETE 3 + VIEW four views COMPARISON:  CT head 10/12/2021. FINDINGS: There is suggestion of layering fluid within the left maxillary sinus. Additional fluid versus mucosal thickening in the inferior left frontal sinus. Visualized paranasal sinuses are otherwise aerated. No evidence of air-fluid level on the right. Tubing overlying the left nasal cavity likely corresponding to nasal mucosal compression device. No significant bone abnormalities are seen. IMPRESSION:  Air-fluid level in the left maxillary sinus, likely layering blood products. Mucosal thickening versus fluid in the inferior left frontal sinus. Electronically Signed   By: Emily Filbert M.D.   On: 04/21/2023 13:53        Scheduled Meds:  acetaminophen  1,000 mg Oral TID   arformoterol  15 mcg Nebulization BID   ascorbic acid  500 mg Oral Daily   azelastine  2 spray Each Nare BID   bisacodyl  10 mg Oral  Daily   budesonide (PULMICORT) nebulizer solution  0.25 mg Nebulization BID   buPROPion  300 mg Oral q morning   busPIRone  15 mg Oral Daily   chlorhexidine  10 mL Mouth/Throat QID   cholecalciferol  1,000 Units Oral Daily   diltiazem  180 mg Oral Daily   docusate sodium  200 mg Oral BID   DULoxetine  60 mg Oral BID   folic acid  1 mg Oral Daily   gabapentin  100 mg Oral TID   guaiFENesin  1,200 mg Oral BID   hydrALAZINE  25 mg Oral Q8H   insulin aspart  0-5 Units Subcutaneous QHS   insulin aspart  0-9 Units Subcutaneous TID WC   insulin aspart  3 Units Subcutaneous TID WC   insulin glargine-yfgn  5 Units Subcutaneous BID   magnesium oxide  200 mg Oral Daily   melatonin  3 mg Oral QHS   multivitamin with minerals  1 tablet Oral Daily   Oxcarbazepine  600 mg Oral TID   oxymetazoline  3 spray Each Nare Q8H   pantoprazole  40 mg Oral BID   polyethylene glycol  17 g Oral TID   revefenacin  175 mcg Nebulization Daily   sodium chloride  2 spray Each Nare BID   tamsulosin  0.8 mg Oral Daily   traZODone  75 mg Oral QHS   Continuous Infusions:     LOS: 19 days   Alwyn Ren, MD 04/23/2023, 11:59 AM

## 2023-04-23 NOTE — Progress Notes (Signed)
 Mobility Specialist - Progress Note   04/23/23 1342  Oxygen Therapy  SpO2 (!) 81 %  O2 Device Nasal Cannula  O2 Flow Rate (L/min) 6 L/min  Patient Activity (if Appropriate) Ambulating  Mobility  Activity Ambulated independently in hallway  Level of Assistance Independent  Assistive Device None  Distance Ambulated (ft) 80 ft  Activity Response Tolerated well  Mobility Referral Yes  Mobility visit 1 Mobility  Mobility Specialist Start Time (ACUTE ONLY) 1330  Mobility Specialist Stop Time (ACUTE ONLY) 1342  Mobility Specialist Time Calculation (min) (ACUTE ONLY) 12 min   Pt received in bed and agreeable to mobility. Pt took x1 standing rest break d/t SOB. Upon returning to room, pt desat to 81%. Encouraged pursed lip breaths bringing SpO2 back to 94%. No complaints during session. Pt to EOB after session with all needs met.   Pre-mobility: 95% SpO2 (6L [place]) During mobility: 81% SpO2 (6L [place]) Post-mobility: 94% SPO2 (6L [place])  Chief Technology Officer

## 2023-04-24 ENCOUNTER — Encounter (HOSPITAL_COMMUNITY): Payer: Self-pay | Admitting: Psychiatric/Mental Health

## 2023-04-24 ENCOUNTER — Emergency Department (HOSPITAL_COMMUNITY): Payer: No Typology Code available for payment source

## 2023-04-24 ENCOUNTER — Other Ambulatory Visit (HOSPITAL_COMMUNITY): Payer: Self-pay

## 2023-04-24 ENCOUNTER — Other Ambulatory Visit: Payer: Self-pay

## 2023-04-24 ENCOUNTER — Emergency Department (HOSPITAL_COMMUNITY)
Admission: EM | Admit: 2023-04-24 | Discharge: 2023-04-28 | Disposition: A | Discharge status: Home / Self Care | Payer: No Typology Code available for payment source | Attending: Emergency Medicine | Admitting: Emergency Medicine

## 2023-04-24 DIAGNOSIS — E119 Type 2 diabetes mellitus without complications: Secondary | ICD-10-CM | POA: Insufficient documentation

## 2023-04-24 DIAGNOSIS — I10 Essential (primary) hypertension: Secondary | ICD-10-CM | POA: Insufficient documentation

## 2023-04-24 DIAGNOSIS — J449 Chronic obstructive pulmonary disease, unspecified: Secondary | ICD-10-CM | POA: Insufficient documentation

## 2023-04-24 DIAGNOSIS — F332 Major depressive disorder, recurrent severe without psychotic features: Secondary | ICD-10-CM | POA: Insufficient documentation

## 2023-04-24 DIAGNOSIS — J189 Pneumonia, unspecified organism: Secondary | ICD-10-CM | POA: Diagnosis not present

## 2023-04-24 DIAGNOSIS — Z85118 Personal history of other malignant neoplasm of bronchus and lung: Secondary | ICD-10-CM | POA: Insufficient documentation

## 2023-04-24 DIAGNOSIS — R45851 Suicidal ideations: Secondary | ICD-10-CM | POA: Diagnosis not present

## 2023-04-24 DIAGNOSIS — F339 Major depressive disorder, recurrent, unspecified: Secondary | ICD-10-CM | POA: Diagnosis present

## 2023-04-24 LAB — RAPID URINE DRUG SCREEN, HOSP PERFORMED
Amphetamines: NOT DETECTED
Barbiturates: NOT DETECTED
Benzodiazepines: NOT DETECTED
Cocaine: NOT DETECTED
Opiates: NOT DETECTED
Tetrahydrocannabinol: NOT DETECTED

## 2023-04-24 LAB — COMPREHENSIVE METABOLIC PANEL
ALT: 16 U/L (ref 0–44)
AST: 20 U/L (ref 15–41)
Albumin: 3.3 g/dL — ABNORMAL LOW (ref 3.5–5.0)
Alkaline Phosphatase: 79 U/L (ref 38–126)
Anion gap: 9 (ref 5–15)
BUN: 13 mg/dL (ref 8–23)
CO2: 27 mmol/L (ref 22–32)
Calcium: 8.5 mg/dL — ABNORMAL LOW (ref 8.9–10.3)
Chloride: 95 mmol/L — ABNORMAL LOW (ref 98–111)
Creatinine, Ser: 0.78 mg/dL (ref 0.61–1.24)
GFR, Estimated: >60 mL/min (ref >60)
Glucose, Bld: 220 mg/dL — ABNORMAL HIGH (ref 70–99)
Potassium: 4.2 mmol/L (ref 3.5–5.1)
Sodium: 131 mmol/L — ABNORMAL LOW (ref 135–145)
Total Bilirubin: 0.3 mg/dL (ref 0.0–1.2)
Total Protein: 6.8 g/dL (ref 6.5–8.1)

## 2023-04-24 LAB — CBC
HCT: 37.5 % — ABNORMAL LOW (ref 39.0–52.0)
Hemoglobin: 12.6 g/dL — ABNORMAL LOW (ref 13.0–17.0)
MCH: 30.6 pg (ref 26.0–34.0)
MCHC: 33.6 g/dL (ref 30.0–36.0)
MCV: 91 fL (ref 80.0–100.0)
Platelets: 353 10*3/uL (ref 150–400)
RBC: 4.12 MIL/uL — ABNORMAL LOW (ref 4.22–5.81)
RDW: 13.1 % (ref 11.5–15.5)
WBC: 11.1 10*3/uL — ABNORMAL HIGH (ref 4.0–10.5)
nRBC: 0 % (ref 0.0–0.2)

## 2023-04-24 LAB — ACETAMINOPHEN LEVEL: Acetaminophen (Tylenol), Serum: 13 ug/mL (ref 10–30)

## 2023-04-24 LAB — SALICYLATE LEVEL: Salicylate Lvl: <7 mg/dL — ABNORMAL LOW (ref 7.0–30.0)

## 2023-04-24 LAB — GLUCOSE, CAPILLARY: Glucose-Capillary: 104 mg/dL — ABNORMAL HIGH (ref 70–99)

## 2023-04-24 LAB — ETHANOL: Alcohol, Ethyl (B): <10 mg/dL (ref <10)

## 2023-04-24 MED ORDER — MELATONIN 3 MG PO TABS
3.0000 mg | ORAL_TABLET | Freq: Every day | ORAL | Status: DC
  Administered 2023-04-25 – 2023-04-27 (×3): 3 mg via ORAL
  Filled 2023-04-24 (×3): qty 1

## 2023-04-24 MED ORDER — GABAPENTIN 100 MG PO CAPS
100.0000 mg | ORAL_CAPSULE | Freq: Three times a day (TID) | ORAL | Status: DC
  Administered 2023-04-24 – 2023-04-28 (×11): 100 mg via ORAL
  Filled 2023-04-24 (×11): qty 1

## 2023-04-24 MED ORDER — TAMSULOSIN HCL 0.4 MG PO CAPS
0.8000 mg | ORAL_CAPSULE | Freq: Every day | ORAL | Status: DC
  Administered 2023-04-25 – 2023-04-28 (×4): 0.8 mg via ORAL
  Filled 2023-04-24 (×4): qty 2

## 2023-04-24 MED ORDER — BUPROPION HCL ER (XL) 150 MG PO TB24
300.0000 mg | ORAL_TABLET | Freq: Every morning | ORAL | Status: DC
  Administered 2023-04-25 – 2023-04-28 (×4): 300 mg via ORAL
  Filled 2023-04-24 (×4): qty 2

## 2023-04-24 MED ORDER — OXYCODONE HCL 5 MG PO TABS
10.0000 mg | ORAL_TABLET | Freq: Two times a day (BID) | ORAL | Status: DC | PRN
  Administered 2023-04-26 – 2023-04-27 (×2): 10 mg via ORAL
  Filled 2023-04-24 (×2): qty 2

## 2023-04-24 MED ORDER — HYDRALAZINE HCL 25 MG PO TABS
25.0000 mg | ORAL_TABLET | Freq: Two times a day (BID) | ORAL | 2 refills | Status: DC
  Filled 2023-04-24: qty 60, 30d supply, fill #0

## 2023-04-24 MED ORDER — TRAZODONE HCL 50 MG PO TABS
75.0000 mg | ORAL_TABLET | Freq: Every day | ORAL | Status: DC
  Administered 2023-04-24 – 2023-04-27 (×4): 75 mg via ORAL
  Filled 2023-04-24 (×4): qty 2

## 2023-04-24 MED ORDER — CYCLOBENZAPRINE HCL 10 MG PO TABS
10.0000 mg | ORAL_TABLET | Freq: Every day | ORAL | Status: DC
  Administered 2023-04-24 – 2023-04-27 (×4): 10 mg via ORAL
  Filled 2023-04-24 (×4): qty 1

## 2023-04-24 MED ORDER — OXYCODONE HCL 10 MG PO TABS
10.0000 mg | ORAL_TABLET | Freq: Two times a day (BID) | ORAL | 0 refills | Status: DC | PRN
  Filled 2023-04-24: qty 30, 15d supply, fill #0

## 2023-04-24 MED ORDER — DILTIAZEM HCL ER COATED BEADS 180 MG PO CP24
180.0000 mg | ORAL_CAPSULE | Freq: Every morning | ORAL | Status: DC
Start: 2023-04-25 — End: 2023-04-28
  Administered 2023-04-25 – 2023-04-28 (×4): 180 mg via ORAL
  Filled 2023-04-24 (×4): qty 1

## 2023-04-24 MED ORDER — ACETAMINOPHEN 500 MG PO TABS
1000.0000 mg | ORAL_TABLET | Freq: Four times a day (QID) | ORAL | Status: DC | PRN
  Administered 2023-04-24 – 2023-04-28 (×8): 1000 mg via ORAL
  Filled 2023-04-24 (×8): qty 2

## 2023-04-24 MED ORDER — SALINE SPRAY 0.65 % NA SOLN: 1.0000 | Freq: Every day | NASAL | Status: DC

## 2023-04-24 MED ORDER — HYDRALAZINE HCL 25 MG PO TABS
25.0000 mg | ORAL_TABLET | Freq: Two times a day (BID) | ORAL | Status: DC
  Administered 2023-04-24 – 2023-04-28 (×8): 25 mg via ORAL
  Filled 2023-04-24 (×8): qty 1

## 2023-04-24 NOTE — Care Management Important Message (Signed)
 Important Message  Patient Details IM Letter given. Name: YAIR DUSZA MRN: [id number] Date of Birth: 1950/12/02   Important Message Given:  Yes - Medicare IM     Caren Macadam 04/24/2023, 10:38 AM

## 2023-04-24 NOTE — Progress Notes (Signed)
 Patient discharged home, IV removed, discharge paperwork provided and explained to patient, patient verbalized understanding, awaiting ordered medications to be delivered at the bedside prior to discharge.

## 2023-04-24 NOTE — Discharge Summary (Signed)
 Physician Discharge Summary  Mario [name]:[id number] DOB: Oct 06, 1950 DOA: 04/01/2023  PCP: [name]  Admit date: 04/01/2023 Discharge date: 04/24/2023  Admitted From: home Disposition:  home  Recommendations for Outpatient Follow-up:  Follow up with PCP in 1-2 weeks Please obtain BMP/CBC in one week Please follow up on the following pending results:  Home Health:none Equipment/Devices:none Discharge Condition:stable CODE STATUS:full Diet recommendation: cardiac  Brief/Interim Summary:   73 y.o. 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  Came back to the ED with a white count of 13 BNP of 21 chest x-ray showed right basilar infiltrate with persistent airspace disease.  Due to ongoing shortness of breath hemoptysis a CT of the chest was done that showed large left lower lobe infiltrate some pulmonary fibrosis mild loculated fluid collection pulmonary and critical care was consulted and related that the limited endobronchial due to his oxygen requirement.  He was continued on empiric antibiotics and diuretics.  He continues to have hemoptysis despite 1 week of anticoagulation interruption.  Discharge Diagnoses:  Principal Problem:   COPD with acute bronchitis (HCC) Active Problems:   Chronic respiratory failure with hypoxia, on home O2 therapy (HCC) - 4 L/min   DM2 (diabetes mellitus, type 2) (HCC)   History of pulmonary embolus (PE)   BPH (benign prostatic hyperplasia)   OSA (obstructive sleep apnea)   Hypertension associated with diabetes (HCC)   COPD exacerbation (HCC)   Hemoptysis   Epistaxis  Acute respiratory failure with hypoxia in a patient with COPD on 5 L of oxygen dependent with a  recent pneumonia and RSV infection readmitted and found to have hemoptysis while on DOAC:  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  Patient with known history of lung malignancy in both lungs.  He has stage IV lung CA per PCCM there is nothing more they can offer.  Patient will follow-up with his outpatient oncologist. He is status post TXA treatments, IR was unable to embolize, status post bronch.  He completed 7 days of Zosyn. Eliquis was stopped indefinitely due to ongoing hemoptysis.  Also note that he had severe epistaxis also during this hospital stay which required nasal packing. Status post bronchoscopy on 04/17/2023 showed focal area of granulation tissue that was biopsy as it was oozing cauterized BAL performed.   History of PE: Was on Eliquis prior to admission discontinued due to hemoptysis. Will need to be off Eliquis indefinitely. CT angio of the chest on 03/17/2023 was negative for PE.   History of lung cancer: Status post surgery.  [facility]   Diabetes mellitus type 2: Continue metformin as you were taking prior to admission.  A1C 6.07 Mar 2023   Essential hypertension: Continue diltiazem.  BPH: Continue Flomax.   General anxiety disorder: Continue bupropion, BuSpar, Cymbalta, Trileptal and trazodone.   Hypovolemic hyponatremia: Resolved    sleep apnea: Continue CPAP at night.   Constipation: Continue MiraLAX.   Epistaxis-seen by Dr. Allena Katz, left nose was packed and patient accidentally pulled it out 4 days later.  At the time of discharge she has not had any further epistaxis or hemoptysis.  He was asked to continue oxygen at home as humidified and use saline nasal spray  daily.   Estimated body mass index is 39.06 kg/m as calculated from the following:   Height as of this encounter: 6\' 2"  (1.88 m).   Weight as of this encounter: 138 kg.  Discharge Instructions  Discharge Instructions     Diet - low sodium heart healthy    Complete by: As directed    Increase activity slowly   Complete by: As directed       Allergies as of 04/24/2023       Reactions   Demerol [meperidine] Nausea And Vomiting, Other (See Comments)   Made the patient "violently sick"   Zocor [simvastatin] Nausea And Vomiting, Other (See Comments)   Made him very jittery, also   Beet [beta Vulgaris] Nausea And Vomiting   Liver Nausea And Vomiting        Medication List     STOP taking these medications    ascorbic acid 500 MG tablet Commonly known as: VITAMIN C   aspirin EC 81 MG tablet   BLACK CURRANT SEED OIL PO   clobetasol ointment 0.05 % Commonly known as: TEMOVATE   Eliquis 5 MG Tabs tablet Generic drug: apixaban   fluticasone 50 MCG/ACT nasal spray Commonly known as: FLONASE   folic acid 1 MG tablet Commonly known as: FOLVITE   GARLIC PO   KRILL OIL PO   lidocaine 5 % ointment Commonly known as: XYLOCAINE   MAGNESIUM OXIDE PO   mometasone 220 MCG/ACT inhaler Commonly known as: ASMANEX   predniSONE 10 MG tablet Commonly known as: DELTASONE   vitamin B-12 100 MCG tablet Commonly known as: CYANOCOBALAMIN       TAKE these medications    acetaminophen 500 MG tablet Commonly known as: TYLENOL Take 1,000 mg by mouth every 6 (six) hours as needed for mild pain (pain score 1-3) or moderate pain (pain score 4-6).   albuterol (2.5 MG/3ML) 0.083% nebulizer solution Commonly known as: PROVENTIL Take 2.5 mg by nebulization every 6 (six) hours as needed for wheezing or shortness of breath. What changed: Another medication with the same name was removed. Continue taking this medication, and follow the directions you see here.   albuterol 108 (90 Base) MCG/ACT inhaler Commonly known as: VENTOLIN HFA Inhale 2 puffs into the lungs every 4 (four) hours as needed for wheezing or shortness of breath. What changed: Another medication with the same name was removed. Continue taking this medication, and follow  the directions you see here.   azelastine 0.1 % nasal spray Commonly known as: ASTELIN Place 1 spray into both nostrils 2 (two) times daily. Use in each nostril as directed   bisacodyl 5 MG EC tablet Commonly known as: DULCOLAX Take 2 tablets (10 mg total) by mouth daily as needed for moderate constipation.   buPROPion 300 MG 24 hr tablet Commonly known as: WELLBUTRIN XL Take 300 mg by mouth every morning.   busPIRone 15 MG tablet Commonly known as: BUSPAR Take 15 mg by mouth in the morning.   CALCIUM 500 PO Take 1 tablet by mouth daily.   Carboxymethylcellulose Sod PF 0.5 % Soln Place 1 drop into both eyes 4 (four) times daily as needed (dry eyes).   cholecalciferol 25 MCG (1000 UNIT) tablet Commonly known as: VITAMIN D3 Take 1,000 Units by mouth daily.   cyclobenzaprine 10 MG tablet Commonly known as: FLEXERIL Take 10 mg by mouth at bedtime.   cycloSPORINE 0.05 % ophthalmic emulsion Commonly known as: RESTASIS Place 1 drop into both eyes every 12 (  twelve) hours.   diltiazem 180 MG 24 hr capsule Commonly known as: CARDIZEM CD Take 180 mg by mouth in the morning.   docusate sodium 100 MG capsule Commonly known as: COLACE Take 200 mg by mouth 2 (two) times daily.   DULoxetine 60 MG capsule Commonly known as: CYMBALTA Take 60 mg by mouth 2 (two) times daily.   Ensure Original Liqd Take 1 Bottle by mouth daily as needed (in between meals).   gabapentin 100 MG capsule Commonly known as: NEURONTIN Take 100 mg by mouth 3 (three) times daily.   hydrALAZINE 25 MG tablet Commonly known as: APRESOLINE Take 1 tablet (25 mg total) by mouth 2 (two) times daily.   LACTULOSE PO Take 2 Scoops by mouth 2 (two) times daily as needed. 2 tablespoonfuls   Melatonin 3 MG Caps Take 3 mg by mouth at bedtime.   metFORMIN 850 MG tablet Commonly known as: GLUCOPHAGE Take 850 mg by mouth every evening.   MULTIVITAMIN ADULT PO Take 1 tablet by mouth daily.   omeprazole 20  MG capsule Commonly known as: PRILOSEC Take 1 capsule (20 mg total) by mouth daily.   Oxcarbazepine 300 MG tablet Commonly known as: TRILEPTAL Take 600 mg by mouth in the morning, at noon, and at bedtime.   Oxycodone HCl 10 MG Tabs Take 1 tablet (10 mg total) by mouth 2 (two) times daily as needed for moderate pain (pain score 4-6) or severe pain (pain score 7-10).   polyethylene glycol powder 17 GM/SCOOP powder Commonly known as: GLYCOLAX/MIRALAX Take 17 g by mouth daily.   psyllium 95 % Pack Commonly known as: HYDROCIL/METAMUCIL Take 1 packet by mouth daily.   tamsulosin 0.4 MG Caps capsule Commonly known as: FLOMAX Take 0.8 mg by mouth daily.   Tiotropium Bromide-Olodaterol 2.5-2.5 MCG/ACT Aers Inhale 2 each into the lungs every morning. 2 puffs   traZODone 50 MG tablet Commonly known as: DESYREL Take 75 mg by mouth at bedtime.        Follow-up Information     [facility] Follow up.   Specialty: [facility] Contact information: [address] [place] [address] [phone]         [name], MD Follow up.   Specialty: Oncology Contact information: [address] [place] [address] [phone]                Allergies  Allergen Reactions   Demerol [Meperidine] Nausea And Vomiting and Other (See Comments)    Made the patient "violently sick"   Zocor [Simvastatin] Nausea And Vomiting and Other (See Comments)    Made him very jittery, also   Beet [Beta Vulgaris] Nausea And Vomiting   Liver Nausea And Vomiting   Consultations:pccm  Procedures/Studies: DG Sinuses Complete Result Date: 04/21/2023 CLINICAL DATA:  History of epistaxis, bilateral but worse on the left. EXAM: PARANASAL SINUSES - COMPLETE 3 + VIEW four views COMPARISON:  CT head 10/12/2021. FINDINGS: There is suggestion of layering fluid within the left maxillary sinus. Additional fluid versus mucosal thickening in the inferior left  frontal sinus. Visualized paranasal sinuses are otherwise aerated. No evidence of air-fluid level on the right. Tubing overlying the left nasal cavity likely corresponding to nasal mucosal compression device. No significant bone abnormalities are seen. IMPRESSION: Air-fluid level in the left maxillary sinus, likely layering blood products. Mucosal thickening versus fluid in the inferior left frontal sinus. Electronically Signed   By: Phylliss Blakes.D.  On: 04/21/2023 13:53   DG Chest 1 View Result Date: 04/12/2023 CLINICAL DATA:  Hemoptysis EXAM: PORTABLE CHEST 1 VIEW COMPARISON:  04/01/2023 FINDINGS: Cardiac shadow is stable. Persistent bibasilar airspace opacities are noted left greater than right with associated small effusions. The overall appearance is stable from the prior study. No pneumothorax is seen. IMPRESSION: Stable appearance when compared with the previous exam. Electronically Signed   By: Alcide Clever M.D.   On: 04/12/2023 10:07   CT CHEST W CONTRAST Result Date: 04/06/2023 CLINICAL DATA:  Hemoptysis. EXAM: CT CHEST WITH CONTRAST TECHNIQUE: Multidetector CT imaging of the chest was performed during intravenous contrast administration. RADIATION DOSE REDUCTION: This exam was performed according to the departmental dose-optimization program which includes automated exposure control, adjustment of the mA and/or kV according to patient size and/or use of iterative reconstruction technique. CONTRAST:  75mL OMNIPAQUE IOHEXOL 300 MG/ML  SOLN COMPARISON:  March 17, 2023. FINDINGS: Cardiovascular: Atherosclerosis of thoracic aorta is noted without aneurysm or dissection. Normal cardiac size. No pericardial effusion. Mild coronary artery calcifications are noted. Mediastinum/Nodes: No enlarged mediastinal, hilar, or axillary lymph nodes. Thyroid gland, trachea, and esophagus demonstrate no significant findings. Lungs/Pleura: No pneumothorax is noted. Emphysematous disease is noted bilaterally. Small  loculated pleural effusion is noted laterally in the left lung base. Stable airspace opacity is noted in left lower lobe and lingular segment left upper lobe concerning for pneumonia or possibly fibrosis from prior radiation treatment. Multiple patchy airspace opacities are noted in the right lower lobe which are slightly decreased compared to prior exam, most consistent with multifocal pneumonia, although underlying malignancy cannot be excluded given the history of lung cancer. Upper Abdomen: No acute abnormality. Musculoskeletal: Old left rib fractures are again noted. No definite acute osseous abnormality is noted. IMPRESSION: Stable large airspace opacity is noted in left lower lobe as well as in lingular segment of left upper lobe most consistent with pneumonia, or possibly fibrosis from prior radiation treatment given the history of lung cancer. Small loculated fluid collection is seen laterally in the left lung base which potentially may represent empyema. Continued airspace opacity is noted posteriorly in right lower lobe which is slightly improved compared to prior exam and most consistent with pneumonia. Mild coronary artery calcifications are noted. Aortic Atherosclerosis (ICD10-I70.0) and Emphysema (ICD10-J43.9). Electronically Signed   By: Lupita Raider M.D.   On: 04/06/2023 12:39   DG Chest Port 1 View Result Date: 04/01/2023 CLINICAL DATA:  Shortness of breath EXAM: PORTABLE CHEST 1 VIEW COMPARISON:  03/26/2023, CT and chest x-ray 03/17/2023, 10/31/2022, 09/21/2022, CT 07/26/2020 FINDINGS: Emphysema. Small right-sided pleural effusion. Irregular and bandlike opacity in the right lower lung without significant change. Chronic pleural and consolidative process at the left base, in addition to left interstitial and ground-glass opacity. Stable cardiomediastinal silhouette. No pneumothorax IMPRESSION: Little change in radiographic appearance of the chest with small right effusion and irregular and  bandlike right lower lung opacity. Chronic pleural and consolidative process at left base, some of which may correspond to the history of lung cancer. Electronically Signed   By: Jasmine Pang M.D.   On: 04/01/2023 18:24   DG Chest 2 View Result Date: 03/26/2023 CLINICAL DATA:  Cough.  Chronic hypoxia. EXAM: CHEST - 2 VIEW COMPARISON:  Radiograph 03/23/2023, CT 03/17/2023 FINDINGS: Stable heart size and mediastinal contours. Chronic volume loss in the left hemithorax. Chronic hyperinflation, emphysema and bronchial thickening. Increasing opacity at the right lung base. Equivocal worsening of left lung base opacity. No large  pleural effusion. No pneumothorax. IMPRESSION: 1. Increasing bibasilar opacities which may represent pneumonia in the appropriate clinical setting. 2. Emphysema with chronic hyperinflation and bronchial thickening. Electronically Signed   By: Narda Rutherford M.D.   On: 03/26/2023 15:29   (Echo, Carotid, EGD, Colonoscopy, ERCP)    Subjective: Patient resting no further nosebleed or hemoptysis reported  Discharge Exam: Vitals:   04/24/23 0625 04/24/23 0748  BP: 120/63   Pulse: 77   Resp: 16   Temp: 97.8 F (36.6 C)   SpO2: 96% 97%   Vitals:   04/23/23 1526 04/23/23 2116 04/24/23 0625 04/24/23 0748  BP: (!) 159/81 129/65 120/63   Pulse:  83 77   Resp:  18 16   Temp:  98.5 F (36.9 C) 97.8 F (36.6 C)   TempSrc:  Oral Oral   SpO2:  98% 96% 97%  Weight:      Height:        General: Pt is alert, awake, not in acute distress Cardiovascular: RRR, S1/S2 +, no rubs, no gallops Respiratory: Few rhonchi bilaterally Abdominal: Soft, NT, ND, bowel sounds + Extremities: no edema, no cyanosis    The results of significant diagnostics from this hospitalization (including imaging, microbiology, ancillary and laboratory) are listed below for reference.     Microbiology: Recent Results (from the past 240 hours)  Fungus Culture With Stain     Status: None (Preliminary  result)   Collection Time: 04/17/23  1:11 PM   Specimen: Bronchial Alveolar Lavage; Respiratory  Result Value Ref Range Status   Fungus Stain Final report  Final    Comment: (NOTE) Performed At: [facility] [address] [place], [place] [id number] Jolene Schimke MD MV:[id number]    Fungus (Mycology) Culture PENDING  Incomplete   Fungal Source LEFT UPPER LOBE, BAL  Final    Comment: Performed at [facility], [address], [place], [place] [address]  Culture, BAL-quantitative w Gram Stain     Status: None   Collection Time: 04/17/23  1:11 PM   Specimen: Bronchial Alveolar Lavage; Respiratory  Result Value Ref Range Status   Specimen Description BRONCHIAL ALVEOLAR LAVAGE  Final   Special Requests LUL  Final   Gram Stain   Final    RARE WBC PRESENT, PREDOMINANTLY PMN NO ORGANISMS SEEN    Culture   Final    NO GROWTH < 24 HOURS Performed at [facility], [address], [place], [place] [address]    Report Status 04/19/2023 FINAL  Final  Aerobic/Anaerobic Culture w Gram Stain (surgical/deep wound)     Status: None   Collection Time: 04/17/23  1:11 PM   Specimen: Bronchial Alveolar Lavage; Respiratory  Result Value Ref Range Status   Specimen Description BRONCHIAL ALVEOLAR LAVAGE  Final   Special Requests LUL  Final   Gram Stain   Final    RARE WBC PRESENT, PREDOMINANTLY PMN NO ORGANISMS SEEN    Culture   Final    RARE Normal respiratory flora-no Staph aureus or Pseudomonas seen NO ANAEROBES ISOLATED Performed at [facility], [address], [place], [place] [address]    Report Status 04/22/2023 FINAL  Final  Acid Fast Smear (AFB)     Status: None   Collection Time: 04/17/23  1:11 PM   Specimen: Bronchial Alveolar Lavage; Respiratory  Result Value Ref Range Status   AFB Specimen Processing Concentration  Final   Acid Fast Smear Negative  Final    Comment: (NOTE) Performed  At: [facility] [address] [place], [place]  [id number] Jolene Schimke MD NW:[id number]    Source (AFB) LEFT UPPER LOBE, BAL  Final    Comment: Performed at [facility], [address] [place], [place] [address]  Fungus Culture Result     Status: None   Collection Time: 04/17/23  1:11 PM  Result Value Ref Range Status   Result 1 Comment  Final    Comment: (NOTE) KOH/Calcofluor preparation:  no fungus observed. Performed At: [facility] [address] [place], [place] [id number] Jolene Schimke MD LK:[id number]      Labs: BNP (last 3 results) Recent Labs    03/23/23 1638 03/27/23 1046 04/01/23 1840  BNP 32.3 36.3 29.0   Basic Metabolic Panel: Recent Labs  Lab 04/18/23 0458 04/23/23 1247  NA 133* 136  K 3.7 4.5  CL 100 97*  CO2 25 29  GLUCOSE 93 137*  BUN 20 12  CREATININE 0.89 0.48*  CALCIUM 8.2* 8.7*  MG 2.1  --    Liver Function Tests: Recent Labs  Lab 04/23/23 1247  AST 16  ALT 14  ALKPHOS 70  BILITOT 0.4  PROT 6.6  ALBUMIN 3.2*   No results for input(s): "LIPASE", "AMYLASE" in the last 168 hours. No results for input(s): "AMMONIA" in the last 168 hours. CBC: Recent Labs  Lab 04/19/23 1745 04/23/23 1247  WBC 13.0* 10.6*  NEUTROABS 9.0*  --   HGB 13.4 12.6*  HCT 41.3 40.4  MCV 92.6 94.4  PLT 361 335   Cardiac Enzymes: No results for input(s): "CKTOTAL", "CKMB", "CKMBINDEX", "TROPONINI" in the last 168 hours. BNP: Invalid input(s): "POCBNP" CBG: Recent Labs  Lab 04/23/23 0739 04/23/23 1134 04/23/23 1702 04/23/23 2116 04/24/23 0740  GLUCAP 105* 157* 140* 137* 104*   D-Dimer No results for input(s): "DDIMER" in the last 72 hours. Hgb A1c No results for input(s): "HGBA1C" in the last 72 hours. Lipid Profile No results for input(s): "CHOL", "HDL", "LDLCALC", "TRIG", "CHOLHDL", "LDLDIRECT" in the last 72 hours. Thyroid function studies No results for input(s): "TSH", "T4TOTAL", "T3FREE", "THYROIDAB" in the last 72 hours.  Invalid input(s): "FREET3" Anemia work  up No results for input(s): "VITAMINB12", "FOLATE", "FERRITIN", "TIBC", "IRON", "RETICCTPCT" in the last 72 hours. Urinalysis    Component Value Date/Time   COLORURINE YELLOW 09/25/2022 1030   APPEARANCEUR CLEAR 09/25/2022 1030   LABSPEC 1.023 09/25/2022 1030   PHURINE 6.0 09/25/2022 1030   GLUCOSEU NEGATIVE 09/25/2022 1030   HGBUR NEGATIVE 09/25/2022 1030   BILIRUBINUR NEGATIVE 09/25/2022 1030   KETONESUR NEGATIVE 09/25/2022 1030   PROTEINUR NEGATIVE 09/25/2022 1030   UROBILINOGEN 1.0 01/12/2013 0024   NITRITE NEGATIVE 09/25/2022 1030   LEUKOCYTESUR NEGATIVE 09/25/2022 1030   Sepsis Labs Recent Labs  Lab 04/19/23 1745 04/23/23 1247  WBC 13.0* 10.6*   Microbiology Recent Results (from the past 240 hours)  Fungus Culture With Stain     Status: None (Preliminary result)   Collection Time: 04/17/23  1:11 PM   Specimen: Bronchial Alveolar Lavage; Respiratory  Result Value Ref Range Status   Fungus Stain Final report  Final    Comment: (NOTE) Performed At: [facility] [address] [place], [place] [id number] Jolene Schimke MD QV:[id number]    Fungus (Mycology) Culture PENDING  Incomplete   Fungal Source LEFT UPPER LOBE, BAL  Final    Comment: Performed at [facility], [address] [place], [place] [address]  Culture, BAL-quantitative w Gram Stain  Status: None   Collection Time: 04/17/23  1:11 PM   Specimen: Bronchial Alveolar Lavage; Respiratory  Result Value Ref Range Status   Specimen Description BRONCHIAL ALVEOLAR LAVAGE  Final   Special Requests LUL  Final   Gram Stain   Final    RARE WBC PRESENT, PREDOMINANTLY PMN NO ORGANISMS SEEN    Culture   Final    NO GROWTH < 24 HOURS Performed at [facility], [address] [place], [place] [address]    Report Status 04/19/2023 FINAL  Final  Aerobic/Anaerobic Culture w Gram Stain (surgical/deep wound)     Status: None   Collection Time: 04/17/23  1:11 PM   Specimen: Bronchial Alveolar  Lavage; Respiratory  Result Value Ref Range Status   Specimen Description BRONCHIAL ALVEOLAR LAVAGE  Final   Special Requests LUL  Final   Gram Stain   Final    RARE WBC PRESENT, PREDOMINANTLY PMN NO ORGANISMS SEEN    Culture   Final    RARE Normal respiratory flora-no Staph aureus or Pseudomonas seen NO ANAEROBES ISOLATED Performed at [facility], [address] [place], [place] [address]    Report Status 04/22/2023 FINAL  Final  Acid Fast Smear (AFB)     Status: None   Collection Time: 04/17/23  1:11 PM   Specimen: Bronchial Alveolar Lavage; Respiratory  Result Value Ref Range Status   AFB Specimen Processing Concentration  Final   Acid Fast Smear Negative  Final    Comment: (NOTE) Performed At: [facility] [address] [place], [place] [id number] Jolene Schimke MD WG:[id number]    Source (AFB) LEFT UPPER LOBE, BAL  Final    Comment: Performed at [facility], [address], [place], [place] [address]  Fungus Culture Result     Status: None   Collection Time: 04/17/23  1:11 PM  Result Value Ref Range Status   Result 1 Comment  Final    Comment: (NOTE) KOH/Calcofluor preparation:  no fungus observed. Performed At: [facility] [address] [place], [place] [id number] Jolene Schimke MD KG:[id number]      SIGNED:   Alwyn Ren, MD  [facility] 04/24/2023, 12:22 PM

## 2023-04-24 NOTE — ED Provider Notes (Signed)
 [place] EMERGENCY DEPARTMENT AT [facility] Provider Note   CSN: [id number] Arrival date & time: 04/24/23  1758     History Chief Complaint  Patient presents with   Shortness of Breath   Suicidal    HPI Mario [name] is a 73 y.o. male presenting for a myriad of complaints. 73 year old male with expansive medical history, discharge from the hospital earlier today.  Frequently evaluated for his dyspnea secondary to COPD. He states that he was discharged home.  Was home for a few hours and was so short of breath that he would rather kill himself. Very complex hospitalization recently.  Some of the plan of care notes from inpatient service discuss placement options.  However patient appears to have declined skilled nursing placement secondary to prior experiences. He states his discomfort at home made him want to kill himself.  He plans to cut himself or overdose with all the pills he was recently discharged on.   Patient's recorded medical, surgical, social, medication list and allergies were reviewed in the Snapshot window as part of the initial history.   Review of Systems   Review of Systems  Constitutional:  Negative for chills and fever.  HENT:  Negative for ear pain and sore throat.   Eyes:  Negative for pain and visual disturbance.  Respiratory:  Positive for cough, chest tightness and shortness of breath.   Cardiovascular:  Positive for chest pain. Negative for palpitations.  Gastrointestinal:  Negative for abdominal pain and vomiting.  Genitourinary:  Negative for dysuria and hematuria.  Musculoskeletal:  Negative for arthralgias and back pain.  Skin:  Negative for color change and rash.  Neurological:  Negative for seizures and syncope.  All other systems reviewed and are negative.   Physical Exam Updated Vital Signs BP 135/84   Pulse (!) 110   Temp 97.8 F (36.6 C)   Resp (!) 21   Ht 6\' 2"  (1.88 m)   Wt (!) 138 kg   SpO2 94%   BMI 39.06 kg/m   Physical Exam Vitals and nursing note reviewed.  Constitutional:      General: He is not in acute distress.    Appearance: He is well-developed.  HENT:     Head: Normocephalic and atraumatic.  Eyes:     Conjunctiva/sclera: Conjunctivae normal.  Cardiovascular:     Rate and Rhythm: Normal rate and regular rhythm.     Heart sounds: No murmur heard. Pulmonary:     Effort: Pulmonary effort is normal. No respiratory distress.     Breath sounds: Normal breath sounds.  Abdominal:     Palpations: Abdomen is soft.     Tenderness: There is no abdominal tenderness.  Musculoskeletal:        General: No swelling.     Cervical back: Neck supple.  Skin:    General: Skin is warm and dry.     Capillary Refill: Capillary refill takes less than 2 seconds.  Neurological:     Mental Status: He is alert.  Psychiatric:        Mood and Affect: Mood normal.      ED Course/ Medical Decision Making/ A&P    Procedures Procedures   Medications Ordered in ED Medications - No data to display  Medical Decision Making:   This is 73 year old male voluntarily presenting with suicidal ideation.  He initially had multiple symptoms including shortness of breath, chest pain, malaise.  However he endorses these are all chronic in nature and has  no acute complaints.  He is exact statement was "I know I do not need any more medical workup.  I just do not know what to do anymore and want to end it all" Concerning suicidal ideation.  It seems acute on chronic. He has longstanding medical problems and he states these are the underlying source of his suicidal ideation. He seems to be struggling with his new worsening deterioration in the outpatient setting including malignancy diagnosis. Given his repeated and persistent statements that he will kill himself if discharged including using utility knife or overdosing on his medication, I believe he warrants immediate psychiatric evaluation. He currently has good  insight into his condition and has come here voluntarily and is voluntarily requesting evaluation and does not warrant involuntary commitment.  This may have to be reassessed if his capacity is changing or his insight is changing into the severity of his presentation. Will consult psychiatry and placed patient into psychiatrical protocol.  No evidence of acute pathology otherwise on his screening lab work and he is medically cleared for psychiatric disposition.  Disposition:   Based on the above findings, I believe this patient is stable for admission.    Patient/family educated about specific findings on our evaluation and explained exact reasons for admission.  Patient/family educated about clinical situation and time was allowed to answer questions.   Admission team communicated with and agreed with need for admission. Patient admitted. Patient ready to move at this time.     Emergency Department Medication Summary:   Medications - No data to display    Clinical Impression:  1. Suicidal ideation      Data Unavailable   Final Clinical Impression(s) / ED Diagnoses Final diagnoses:  Suicidal ideation    Rx / DC Orders ED Discharge Orders     None         Glyn Ade, MD 04/24/23 2010

## 2023-04-24 NOTE — TOC Transition Note (Signed)
 Transition of Care [facility]) - Discharge Note   Patient Details  Name: Mario [name] MRN: [id number] Date of Birth: December 15, 1950  Transition of Care [facility]) CM/SW Contact:  Amada Jupiter, LCSW Phone Number: 04/24/2023, 10:11 AM   Clinical Narrative:     Pt medically cleared for dc home today and he is aware/ agreeable.  Pt confirms roommate will provide dc transport and will bring his portable O2 tank to travel with.  HHPT already set up with [facility] per pt request. Have alerted Delton Coombes with [facility] of pt's dc so aide services can resume today.  No further TOC needs.  Final next level of care: Home w Home Health Services Barriers to Discharge: Barriers Resolved   Patient Goals and CMS Choice Patient states their goals for this hospitalization and ongoing recovery are:: return home CMS Medicare.gov Compare Post Acute Care list provided to:: (P) Patient        Discharge Placement                       Discharge Plan and Services Additional resources added to the After Visit Summary for       Post Acute Care Choice: (P) Home Health          DME Arranged: N/A DME Agency: NA       HH Arranged: PT HH Agency: [facility] Date HH Agency Contacted: 04/21/23 Time HH Agency Contacted: 1246 Representative spoke with at [facility] Agency: Clifton Custard  Social Drivers of Health (SDOH) Interventions SDOH Screenings   Food Insecurity: No Food Insecurity (04/02/2023)  Housing: Low Risk  (04/02/2023)  Transportation Needs: No Transportation Needs (04/02/2023)  Utilities: Not At Risk (04/02/2023)  Financial Resource Strain: Low Risk  (09/23/2020)  Social Connections: Socially Isolated (04/02/2023)  Stress: No Stress Concern Present (09/23/2020)  Tobacco Use: Medium Risk (04/01/2023)     Readmission Risk Interventions    04/24/2023   10:09 AM 03/25/2023    2:23 PM 11/03/2021   11:50 AM  Readmission Risk Prevention Plan  Transportation Screening Complete Complete Complete   Medication Review Oceanographer) Complete Complete Complete  PCP or Specialist appointment within 3-5 days of discharge   Complete  HRI or Home Care Consult Complete Complete Complete  SW Recovery Care/Counseling Consult Complete Complete Complete  Palliative Care Screening Not Applicable Not Applicable Not Applicable  Skilled Nursing Facility Not Applicable Not Applicable Not Applicable

## 2023-04-24 NOTE — Consult Note (Incomplete)
 [facility] Consult Note  Patient Name: Mario [name] MRN: [id number] DOB: 1951-01-04 DATE OF Consult: 04/24/2023  PRIMARY PSYCHIATRIC DIAGNOSES  1.  *** 2.  *** 3.  ***  RECOMMENDATIONS  {Recommendations:304550007::"Medication recommendations: ***","Non-Medication/therapeutic recommendations: ***","Communication: Treatment team members (and family members if applicable) who were involved in treatment/care discussions and planning, and with whom we spoke or engaged with via secure text/chat, include the following: ***"}  Thank you for involving [place] in the care of this patient. If you have any additional questions or concerns, please call [phone] and ask for me or the provider on-call.  TELEPSYCHIATRY ATTESTATION & CONSENT  As the provider for this telehealth consult, I attest that I verified the patient's identity using two separate identifiers, introduced myself to the patient, provided my credentials, disclosed my location, and performed this encounter via a HIPAA-compliant, real-time, face-to-face, two-way, interactive audio and video platform and with the full consent and agreement of the patient (or guardian as applicable.)  Patient physical location: ED in [facility]. Telehealth provider physical location: home office in state of [place].  Video start time: *** (Central Time) Video end time: *** (Central Time)  IDENTIFYING DATA  Mario [name] is a 73 y.o. year-old male for whom a psychiatric consultation has been ordered by the primary provider. The patient was identified using two separate identifiers.  CHIEF COMPLAINT/REASON FOR CONSULT  Suicidal Ideations   HISTORY OF PRESENT ILLNESS (HPI)  The patient is a 73yo male who presented to the emergency department with multiple complaints today. Patient with significant medical history of COPD, chronic respiratory failure with hypoxia on home O2 therapy, DM2, OSA, HTN, BPH, history of lung cancer, and  history of pulmonary embolism. Past psychiatric history of depression, anxiety, PTSD. Patient recently with extensive medical admission from 04/01/2023- 04/24/2023 for COPD with acute bronchitis. Was discharged home today but once returned home he started feeling so short of breath that he would rather kill himself. Patient reported his physical discomfort was worsening his thoughts to kill himself and reported plans to either cut himself with a utility knife or overdose on pills.          PAST PSYCHIATRIC HISTORY  Per chart review, patient has been seen by psychiatry on consultation while in the ED multiple times with most recent encouters 10/08/2022 and 10/09/2022.    Wellbutrin, Buspar, Cymbalta, Latuda, Trazodone  Otherwise as per HPI above.  PAST MEDICAL HISTORY  Past Medical History:  Diagnosis Date  . Ankylosing spondylitis lumbar region (HCC) 09/25/2022  . Anxiety   . Bronchitis   . COPD (chronic obstructive pulmonary disease) (HCC)   . Depression   . History of radiation therapy    Left lung- 10/05/20-10/15/20- Dr. Antony Blackbird  . Hypertension   . lung ca 09/2020  . MI (myocardial infarction) (HCC)    ????  . On home oxygen therapy    4L/min [place]  . OSA (obstructive sleep apnea)   . Suicide attempt (HCC)   . Tension pneumothorax 06/27/2016  . Uveitis    ***  HOME MEDICATIONS  Facility Ordered Medications  Medication  . acetaminophen (TYLENOL) tablet 1,000 mg  . [START ON 04/25/2023] buPROPion (WELLBUTRIN XL) 24 hr tablet 300 mg  . cyclobenzaprine (FLEXERIL) tablet 10 mg  . [START ON 04/25/2023] diltiazem (CARDIZEM CD) 24 hr capsule 180 mg  . gabapentin (NEURONTIN) capsule 100 mg  . hydrALAZINE (APRESOLINE) tablet 25 mg  . Melatonin CAPS 3 mg  . Oxycodone HCl TABS 10  mg  . [START ON 04/25/2023] tamsulosin (FLOMAX) capsule 0.8 mg  . traZODone (DESYREL) tablet 75 mg   PTA Medications  Medication Sig  . busPIRone (BUSPAR) 15 MG tablet Take 15 mg by mouth in the morning.   . diltiazem (CARDIZEM CD) 180 MG 24 hr capsule Take 180 mg by mouth in the morning.  [name] buPROPion (WELLBUTRIN XL) 300 MG 24 hr tablet Take 300 mg by mouth every morning.  . Tiotropium Bromide-Olodaterol 2.5-2.5 MCG/ACT AERS Inhale 2 each into the lungs every morning. 2 puffs  . cyclobenzaprine (FLEXERIL) 10 MG tablet Take 10 mg by mouth at bedtime.  . Carboxymethylcellulose Sod PF 0.5 % SOLN Place 1 drop into both eyes 4 (four) times daily as needed (dry eyes).  . cycloSPORINE (RESTASIS) 0.05 % ophthalmic emulsion Place 1 drop into both eyes every 12 (twelve) hours.  . Melatonin 3 MG CAPS Take 3 mg by mouth at bedtime.  . metFORMIN (GLUCOPHAGE) 850 MG tablet Take 850 mg by mouth every evening.  . Nutritional Supplements (ENSURE ORIGINAL) LIQD Take 1 Bottle by mouth daily as needed (in between meals).  . polyethylene glycol powder (GLYCOLAX/MIRALAX) 17 GM/SCOOP powder Take 17 g by mouth daily.  . tamsulosin (FLOMAX) 0.4 MG CAPS capsule Take 0.8 mg by mouth daily.  . traZODone (DESYREL) 50 MG tablet Take 75 mg by mouth at bedtime.  . Oxcarbazepine (TRILEPTAL) 300 MG tablet Take 600 mg by mouth in the morning, at noon, and at bedtime.  [name] albuterol (VENTOLIN HFA) 108 (90 Base) MCG/ACT inhaler Inhale 2 puffs into the lungs every 4 (four) hours as needed for wheezing or shortness of breath.  . gabapentin (NEURONTIN) 100 MG capsule Take 100 mg by mouth 3 (three) times daily.  . DULoxetine (CYMBALTA) 60 MG capsule Take 60 mg by mouth 2 (two) times daily.  . cholecalciferol (VITAMIN D3) 25 MCG (1000 UNIT) tablet Take 1,000 Units by mouth daily.  . bisacodyl (DULCOLAX) 5 MG EC tablet Take 2 tablets (10 mg total) by mouth daily as needed for moderate constipation.  [name] omeprazole (PRILOSEC) 20 MG capsule Take 1 capsule (20 mg total) by mouth daily.  [name] azelastine (ASTELIN) 0.1 % nasal spray Place 1 spray into both nostrils 2 (two) times daily. Use in each nostril as directed  . Calcium Carbonate (CALCIUM 500  PO) Take 1 tablet by mouth daily.  [name] docusate sodium (COLACE) 100 MG capsule Take 200 mg by mouth 2 (two) times daily.  [name] acetaminophen (TYLENOL) 500 MG tablet Take 1,000 mg by mouth every 6 (six) hours as needed for mild pain (pain score 1-3) or moderate pain (pain score 4-6).  . Multiple Vitamin (MULTIVITAMIN ADULT PO) Take 1 tablet by mouth daily.  [name] LACTULOSE PO Take 2 Scoops by mouth 2 (two) times daily as needed. 2 tablespoonfuls  . psyllium (HYDROCIL/METAMUCIL) 95 % PACK Take 1 packet by mouth daily.  [name] albuterol (PROVENTIL) (2.5 MG/3ML) 0.083% nebulizer solution Take 2.5 mg by nebulization every 6 (six) hours as needed for wheezing or shortness of breath.  . Oxycodone HCl 10 MG TABS Take 1 tablet (10 mg total) by mouth 2 (two) times daily as needed for moderate pain (pain score 4-6) or severe pain (pain score 7-10).  . hydrALAZINE (APRESOLINE) 25 MG tablet Take 1 tablet (25 mg total) by mouth 2 (two) times daily.  . sodium chloride (OCEAN) 0.65 % SOLN nasal spray Place 1 spray into both nostrils daily at 2 am.   ***  ALLERGIES  Allergies  Allergen Reactions  . Demerol [Meperidine] Nausea And Vomiting and Other (See Comments)    Made the patient "violently sick"  . Zocor [Simvastatin] Nausea And Vomiting and Other (See Comments)    Made him very jittery, also  . Beet [Beta Vulgaris] Nausea And Vomiting  . Liver Nausea And Vomiting    SOCIAL & SUBSTANCE USE HISTORY  Social History   Socioeconomic History  . Marital status: Single    Spouse name: Not on file  . Number of children: Not on file  . Years of education: Not on file  . Highest education level: Not on file  Occupational History  . Occupation: retired  Tobacco Use  . Smoking status: Former    Current packs/day: 0.00    Average packs/day: 1 pack/day for 35.0 years (35.0 ttl pk-yrs)    Types: Cigarettes    Start date: 05/1981    Quit date: 05/2016    Years since quitting: 6.9    Passive exposure: Past  .  Smokeless tobacco: Never  Vaping Use  . Vaping status: Never Used  Substance and Sexual Activity  . Alcohol use: No    Alcohol/week: 0.0 standard drinks of alcohol    Comment: denies use of any drugs or alcohol  . Drug use: No  . Sexual activity: Not Currently  Other Topics Concern  . Not on file  Social History Narrative  . Not on file   Social Drivers of Health   Financial Resource Strain: Low Risk  (09/23/2020)   Overall Financial Resource Strain (CARDIA)   . Difficulty of Paying Living Expenses: Not hard at all  Food Insecurity: No Food Insecurity (04/02/2023)   Hunger Vital Sign   . Worried About Programme researcher, broadcasting/film/video in the Last Year: Never true   . Ran Out of Food in the Last Year: Never true  Transportation Needs: No Transportation Needs (04/02/2023)   PRAPARE - Transportation   . Lack of Transportation (Medical): No   . Lack of Transportation (Non-Medical): No  Physical Activity: Not on file  Stress: No Stress Concern Present (09/23/2020)   [employer] of Occupational Health - Occupational Stress Questionnaire   . Feeling of Stress : Not at all  Social Connections: Socially Isolated (04/02/2023)   Social Connection and Isolation Panel [NHANES]   . Frequency of Communication with Friends and Family: Once a week   . Frequency of Social Gatherings with Friends and Family: More than three times a week   . Attends Religious Services: Never   . Active Member of Clubs or Organizations: No   . Attends Banker Meetings: Never   . Marital Status: Divorced   Social History   Tobacco Use  Smoking Status Former  . Current packs/day: 0.00  . Average packs/day: 1 pack/day for 35.0 years (35.0 ttl pk-yrs)  . Types: Cigarettes  . Start date: 05/1981  . Quit date: 05/2016  . Years since quitting: 6.9  . Passive exposure: Past  Smokeless Tobacco Never   Social History   Substance and Sexual Activity  Alcohol Use No  . Alcohol/week: 0.0 standard drinks of  alcohol   Comment: denies use of any drugs or alcohol   Social History   Substance and Sexual Activity  Drug Use No    Additional pertinent information ***.  FAMILY HISTORY  Family History  Problem Relation Age of Onset  . Dementia Father    Family Psychiatric History (if known):  ***  MENTAL STATUS EXAM (  MSE)  Mental Status Exam: General Appearance: {Appearance:22683}  Orientation:  {BHH ORIENTATION (PAA):22689}  Memory:  {BHH MEMORY:22881}  Concentration:  {Concentration:21399}  Recall:  {BHH GOOD/FAIR/POOR:22877}  Attention  {BH Attention Span:31825}  Eye Contact:  {BHH EYE CONTACT:22684}  Speech:  {Speech:22685}  Language:  {BHH GOOD/FAIR/POOR:22877}  Volume:  {Volume (PAA):22686}  Mood: ***  Affect:  {Affect (PAA):22687}  Thought Process:  {Thought Process (PAA):22688}  Thought Content:  {Thought Content:22690}  Suicidal Thoughts:  {ST/HT (PAA):22692}  Homicidal Thoughts:  {ST/HT (PAA):22692}  Judgement:  {Judgement (PAA):22694}  Insight:  {Insight (PAA):22695}  Psychomotor Activity:  {Psychomotor (PAA):22696}  Akathisia:  {BHH YES OR NO:22294}  Fund of Knowledge:  {BHH GOOD/FAIR/POOR:22877}    Assets:  {Assets (PAA):22698}  Cognition:  {chl bhh cognition:304700322}  ADL's:  {BHH ZOX'W:96045}  AIMS (if indicated):       VITALS  Blood pressure 135/84, pulse (!) 110, temperature 97.8 F (36.6 C), resp. rate (!) 21, height 6\' 2"  (1.88 m), weight (!) 138 kg, SpO2 94%.  LABS  Admission on 04/24/2023  Component Date Value Ref Range Status  . Sodium 04/24/2023 131 (L)  135 - 145 mmol/L Final  . Potassium 04/24/2023 4.2  3.5 - 5.1 mmol/L Final  . Chloride 04/24/2023 95 (L)  98 - 111 mmol/L Final  . CO2 04/24/2023 27  22 - 32 mmol/L Final  . Glucose, Bld 04/24/2023 220 (H)  70 - 99 mg/dL Final   Glucose reference range applies only to samples taken after fasting for at least 8 hours.  . BUN 04/24/2023 13  8 - 23 mg/dL Final  . Creatinine, Ser 04/24/2023 0.78   0.61 - 1.24 mg/dL Final  . Calcium 40/98/1191 8.5 (L)  8.9 - 10.3 mg/dL Final  . Total Protein 04/24/2023 6.8  6.5 - 8.1 g/dL Final  . Albumin 47/82/9562 3.3 (L)  3.5 - 5.0 g/dL Final  . AST 13/09/6576 20  15 - 41 U/L Final  . ALT 04/24/2023 16  0 - 44 U/L Final  . Alkaline Phosphatase 04/24/2023 79  38 - 126 U/L Final  . Total Bilirubin 04/24/2023 0.3  0.0 - 1.2 mg/dL Final  . GFR, Estimated 04/24/2023 >60  >60 mL/min Final   Comment: (NOTE) Calculated using the CKD-EPI Creatinine Equation (2021)   . Anion gap 04/24/2023 9  5 - 15 Final   Performed at [facility], [address] [place], [place] [address]  . Alcohol, Ethyl (B) 04/24/2023 <10  <10 mg/dL Final   Comment: (NOTE) Lowest detectable limit for serum alcohol is 10 mg/dL.  For medical purposes only. Performed at [facility], [address] [place], [place] [address]   . Salicylate Lvl 04/24/2023 <7.0 (L)  7.0 - 30.0 mg/dL Final   Performed at [facility], [address], [place], [place] [address]  . Acetaminophen (Tylenol), Serum 04/24/2023 13  10 - 30 ug/mL Final   Comment: (NOTE) Therapeutic concentrations vary significantly. A range of 10-30 ug/mL  may be an effective concentration for many patients. However, some  are best treated at concentrations outside of this range. Acetaminophen concentrations >150 ug/mL at 4 hours after ingestion  and >50 ug/mL at 12 hours after ingestion are often associated with  toxic reactions.  Performed at [facility], [address] [place], [place] [address]   . WBC 04/24/2023 11.1 (H)  4.0 - 10.5 K/uL Final  . RBC 04/24/2023 4.12 (L)  4.22 - 5.81 MIL/uL Final  .  Hemoglobin 04/24/2023 12.6 (L)  13.0 - 17.0 g/dL Final  . HCT 82/95/6213 37.5 (L)  39.0 - 52.0 % Final  . MCV 04/24/2023 91.0  80.0 - 100.0 fL Final  . MCH 04/24/2023 30.6  26.0 - 34.0 pg Final  . MCHC 04/24/2023 33.6  30.0 - 36.0 g/dL  Final  . RDW 08/65/7846 13.1  11.5 - 15.5 % Final  . Platelets 04/24/2023 353  150 - 400 K/uL Final  . nRBC 04/24/2023 0.0  0.0 - 0.2 % Final   Performed at [facility], [address] [place], [place] [address]  . Opiates 04/24/2023 NONE DETECTED  NONE DETECTED Final  . Cocaine 04/24/2023 NONE DETECTED  NONE DETECTED Final  . Benzodiazepines 04/24/2023 NONE DETECTED  NONE DETECTED Final  . Amphetamines 04/24/2023 NONE DETECTED  NONE DETECTED Final  . Tetrahydrocannabinol 04/24/2023 NONE DETECTED  NONE DETECTED Final  . Barbiturates 04/24/2023 NONE DETECTED  NONE DETECTED Final   Comment: (NOTE) DRUG SCREEN FOR MEDICAL PURPOSES ONLY.  IF CONFIRMATION IS NEEDED FOR ANY PURPOSE, NOTIFY LAB WITHIN 5 DAYS.  LOWEST DETECTABLE LIMITS FOR URINE DRUG SCREEN Drug Class                     Cutoff (ng/mL) Amphetamine and metabolites    1000 Barbiturate and metabolites    200 Benzodiazepine                 200 Opiates and metabolites        300 Cocaine and metabolites        300 THC                            50 Performed at [facility], [address], [place], [place] [address]   No results displayed because visit has over 200 results.      PSYCHIATRIC REVIEW OF SYSTEMS (ROS)  ROS: Notable for the following relevant positive findings: ROS  Additional findings:      Musculoskeletal: {Musculoskeletal neeeds/assessment:304550014}      Gait & Station: {Gait and Station:304550016}      Pain Screening: {Pain Description:304550015}      Nutrition & Dental Concerns: {Nutrition & Dental Concerns:304550017}  RISK FORMULATION/ASSESSMENT  Is the patient experiencing any suicidal or homicidal ideations: {yes/no:20286}       Explain if yes: *** Protective factors considered for safety management: ***  Risk factors/concerns considered for safety management: *** {CHL BH Risk Factors Safety Management:304550011}  Is there a safety management plan with  the patient and treatment team to minimize risk factors and promote protective factors: {yes/no:20286}           Explain: *** Is crisis care placement or psychiatric hospitalization recommended: {yes/no:20286}     Based on my current evaluation and risk assessment, patient is determined at this time to be at:  {Risk level:304550009}  *RISK ASSESSMENT Risk assessment is a dynamic process; it is possible that this patient's condition, and risk level, may change. This should be re-evaluated and managed over time as appropriate. Please re-consult psychiatric consult services if additional assistance is needed in terms of risk assessment and management. If your team decides to discharge this patient, please advise the patient how to best access emergency psychiatric services, or to call 911, if their condition worsens or they feel unsafe in any way.   Assunta Gambles, NP Telepsychiatry Consult Services

## 2023-04-24 NOTE — Consult Note (Signed)
 [facility] Consult Note  Patient Name: Mario [name] MRN: [id number] DOB: Jun 17, 1950 DATE OF Consult: 04/24/2023  PRIMARY PSYCHIATRIC DIAGNOSES  1.  Major Depressive Disorder, Recurrent, Severe, without Psychotic Features 2.  Suicidal Ideations   RECOMMENDATIONS  Recommendations: Medication recommendations: Continue home medication regimen.  -- Wellbutrin XL 300mg  po daily for depression and anxiety -- Trazodone 75mg  po at bedtime for insomnia  -- Zyprexa 10mg  PO/IM Q6H PRN for acute agitation   Non-Medication/therapeutic recommendations: Suicide precautions, inpatient hospitalization. 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 Patient agreeable to VOL, if not, meets criteria for IVC.  Is inpatient psychiatric hospitalization recommended for this patient? Yes (Explain why): Patient with significant depression and suicidal ideations with plan to OD on pills. Has been thinking about purchasing a gun. No current access to weapons. Patient agreeable to VOL psychiatric admission at this time, however, patient meets criteria for IVC if necessary.   From a psychiatric perspective, is this patient appropriate for discharge to an outpatient setting/resource or other less restrictive environment for continued care?  No (Explain why): Patient meets criteria for inpatient psychiatric admission.   Follow-Up Telepsychiatry C/L services: We will continue to follow this patient with you until stabilized or discharged.  If you have any questions or concerns, please call our TeleCare Coordination service at  [phone] and ask for myself or the provider on-call.  Communication: Treatment team members (and family members if applicable) who were involved in treatment/care discussions and planning, and with whom we spoke or engaged with via secure text/chat, include the following: ED primary team  Thank you for involving [place] in the care of this patient. If you have any additional  questions or concerns, please call [phone] and ask for me or the provider on-call.  TELEPSYCHIATRY ATTESTATION & CONSENT  As the provider for this telehealth consult, I attest that I verified the patient's identity using two separate identifiers, introduced myself to the patient, provided my credentials, disclosed my location, and performed this encounter via a HIPAA-compliant, real-time, face-to-face, two-way, interactive audio and video platform and with the full consent and agreement of the patient (or guardian as applicable.)  Patient physical location: ED in [facility]. Telehealth provider physical location: home office in state of [place].  Video start time: 2[facility] Time) Video end time: 2330 [facility] Time)  IDENTIFYING DATA  Mario [name] is a 73 y.o. year-old male for whom a psychiatric consultation has been ordered by the primary provider. The patient was identified using two separate identifiers.  CHIEF COMPLAINT/REASON FOR CONSULT  Suicidal Ideations   HISTORY OF PRESENT ILLNESS (HPI)  The patient is a 73yo male who presented to the emergency department with multiple complaints today. Patient with significant medical history of COPD, chronic respiratory failure with hypoxia on home O2 therapy, DM2, OSA, HTN, BPH, history of lung cancer, and history of pulmonary embolism. Past psychiatric history of depression, anxiety, PTSD. Patient recently with extensive medical admission from 04/01/2023- 04/24/2023 for COPD with acute bronchitis. Was discharged home today but once returned home he started feeling so short of breath that he would rather kill himself. Patient reported his physical discomfort was worsening his thoughts to kill himself and reported plans to either cut himself with a utility knife or overdose on pills.   Upon interview, patient is calm and cooperative, alert and oriented x4. Patient states lately he has been feeling "bad". Patient states "I don't  have very good lungs and it's starting to  wear me out". Patient states at times he feels like "throwing the towel in". Patient shares that his activity is limited due to his breathing abilities and becomes short of breath with minimal activity. States that it is starting to get on his nerves and "all I have to look forward to is it getting worse".   Patient endorses years of depression and passive suicidal ideations related to his significant medical issues. He admits he attempted suicide back in 2014 by strangulation; attempted three times in one week. Has been admitted at the [place] in the past for psychiatric concerns. Lives at home with room mates, no access to guns but states "I've been thinking about buying a gun, just to have one". Patient endorses suicidal ideations with a plan to overdose "I have some pills at home that would make it easy". Patient continues to endorse active suicidal ideations and states if he were to go home he "might do it, I don't know". Patient denies symptoms of psychosis, no symptoms indicative of mania.      PAST PSYCHIATRIC HISTORY  Per chart review, patient has been seen by psychiatry on consultation while in the ED multiple times with most recent encounters 10/08/2022 and 10/09/2022. According to documentation, at that time, patient endorsed not feeling safe at home with thoughts to hurt himself by overdose. Multiple health issues causing depression.   Follows with psychiatry at [facility] Per chart review, history of psychotropic medications including: Wellbutrin, Buspar, Cymbalta, Latuda, Trazodone  Prior suicide attempt in 2014 Patient is a veteran- history of PTSD  Otherwise as per HPI above.  PAST MEDICAL HISTORY  Past Medical History:  Diagnosis Date   Ankylosing spondylitis lumbar region [facility]) 09/25/2022   Anxiety    Bronchitis    COPD (chronic obstructive pulmonary disease) (HCC)    Depression    History of radiation therapy    Left lung- 10/05/20-10/15/20- Dr.  Antony Blackbird   Hypertension    lung ca 09/2020   MI (myocardial infarction) [facility])    ????   On home oxygen therapy    4L/min [place]   OSA (obstructive sleep apnea)    Suicide attempt (HCC)    Tension pneumothorax 06/27/2016   Uveitis      HOME MEDICATIONS  Facility Ordered Medications  Medication   acetaminophen (TYLENOL) tablet 1,000 mg   [START ON 04/25/2023] buPROPion (WELLBUTRIN XL) 24 hr tablet 300 mg   cyclobenzaprine (FLEXERIL) tablet 10 mg   [START ON 04/25/2023] diltiazem (CARDIZEM CD) 24 hr capsule 180 mg   gabapentin (NEURONTIN) capsule 100 mg   hydrALAZINE (APRESOLINE) tablet 25 mg   Melatonin CAPS 3 mg   Oxycodone HCl TABS 10 mg   [START ON 04/25/2023] tamsulosin (FLOMAX) capsule 0.8 mg   traZODone (DESYREL) tablet 75 mg   PTA Medications  Medication Sig   busPIRone (BUSPAR) 15 MG tablet Take 15 mg by mouth in the morning.   diltiazem (CARDIZEM CD) 180 MG 24 hr capsule Take 180 mg by mouth in the morning.   buPROPion (WELLBUTRIN XL) 300 MG 24 hr tablet Take 300 mg by mouth every morning.   Tiotropium Bromide-Olodaterol 2.5-2.5 MCG/ACT AERS Inhale 2 each into the lungs every morning. 2 puffs   cyclobenzaprine (FLEXERIL) 10 MG tablet Take 10 mg by mouth at bedtime.   Carboxymethylcellulose Sod PF 0.5 % SOLN Place 1 drop into both eyes 4 (four) times daily as needed (dry eyes).   cycloSPORINE (RESTASIS) 0.05 % ophthalmic emulsion Place 1 drop into both  eyes every 12 (twelve) hours.   Melatonin 3 MG CAPS Take 3 mg by mouth at bedtime.   metFORMIN (GLUCOPHAGE) 850 MG tablet Take 850 mg by mouth every evening.   Nutritional Supplements (ENSURE ORIGINAL) LIQD Take 1 Bottle by mouth daily as needed (in between meals).   polyethylene glycol powder (GLYCOLAX/MIRALAX) 17 GM/SCOOP powder Take 17 g by mouth daily.   tamsulosin (FLOMAX) 0.4 MG CAPS capsule Take 0.8 mg by mouth daily.   traZODone (DESYREL) 50 MG tablet Take 75 mg by mouth at bedtime.   Oxcarbazepine (TRILEPTAL) 300 MG  tablet Take 600 mg by mouth in the morning, at noon, and at bedtime.   albuterol (VENTOLIN HFA) 108 (90 Base) MCG/ACT inhaler Inhale 2 puffs into the lungs every 4 (four) hours as needed for wheezing or shortness of breath.   gabapentin (NEURONTIN) 100 MG capsule Take 100 mg by mouth 3 (three) times daily.   DULoxetine (CYMBALTA) 60 MG capsule Take 60 mg by mouth 2 (two) times daily.   cholecalciferol (VITAMIN D3) 25 MCG (1000 UNIT) tablet Take 1,000 Units by mouth daily.   bisacodyl (DULCOLAX) 5 MG EC tablet Take 2 tablets (10 mg total) by mouth daily as needed for moderate constipation.   omeprazole (PRILOSEC) 20 MG capsule Take 1 capsule (20 mg total) by mouth daily.   azelastine (ASTELIN) 0.1 % nasal spray Place 1 spray into both nostrils 2 (two) times daily. Use in each nostril as directed   Calcium Carbonate (CALCIUM 500 PO) Take 1 tablet by mouth daily.   docusate sodium (COLACE) 100 MG capsule Take 200 mg by mouth 2 (two) times daily.   acetaminophen (TYLENOL) 500 MG tablet Take 1,000 mg by mouth every 6 (six) hours as needed for mild pain (pain score 1-3) or moderate pain (pain score 4-6).   Multiple Vitamin (MULTIVITAMIN ADULT PO) Take 1 tablet by mouth daily.   LACTULOSE PO Take 2 Scoops by mouth 2 (two) times daily as needed. 2 tablespoonfuls   psyllium (HYDROCIL/METAMUCIL) 95 % PACK Take 1 packet by mouth daily.   albuterol (PROVENTIL) (2.5 MG/3ML) 0.083% nebulizer solution Take 2.5 mg by nebulization every 6 (six) hours as needed for wheezing or shortness of breath.   Oxycodone HCl 10 MG TABS Take 1 tablet (10 mg total) by mouth 2 (two) times daily as needed for moderate pain (pain score 4-6) or severe pain (pain score 7-10).   hydrALAZINE (APRESOLINE) 25 MG tablet Take 1 tablet (25 mg total) by mouth 2 (two) times daily.   sodium chloride (OCEAN) 0.65 % SOLN nasal spray Place 1 spray into both nostrils daily at 2 am.     ALLERGIES  Allergies  Allergen Reactions   Demerol  [Meperidine] Nausea And Vomiting and Other (See Comments)    Made the patient "violently sick"   Zocor [Simvastatin] Nausea And Vomiting and Other (See Comments)    Made him very jittery, also   Beet [Beta Vulgaris] Nausea And Vomiting   Liver Nausea And Vomiting    SOCIAL & SUBSTANCE USE HISTORY  Social History   Socioeconomic History   Marital status: Single    Spouse name: Not on file   Number of children: Not on file   Years of education: Not on file   Highest education level: Not on file  Occupational History   Occupation: retired  Tobacco Use   Smoking status: Former    Current packs/day: 0.00    Average packs/day: 1 pack/day for 35.0 years (35.0  ttl pk-yrs)    Types: Cigarettes    Start date: 05/1981    Quit date: 05/2016    Years since quitting: 6.9    Passive exposure: Past   Smokeless tobacco: Never  Vaping Use   Vaping status: Never Used  Substance and Sexual Activity   Alcohol use: No    Alcohol/week: 0.0 standard drinks of alcohol    Comment: denies use of any drugs or alcohol   Drug use: No   Sexual activity: Not Currently  Other Topics Concern   Not on file  Social History Narrative   Not on file   Social Drivers of Health   Financial Resource Strain: Low Risk  (09/23/2020)   Overall Financial Resource Strain (CARDIA)    Difficulty of Paying Living Expenses: Not hard at all  Food Insecurity: No Food Insecurity (04/02/2023)   Hunger Vital Sign    Worried About Running Out of Food in the Last Year: Never true    Ran Out of Food in the Last Year: Never true  Transportation Needs: No Transportation Needs (04/02/2023)   PRAPARE - Administrator, Civil Service (Medical): No    Lack of Transportation (Non-Medical): No  Physical Activity: Not on file  Stress: No Stress Concern Present (09/23/2020)   [employer] of Occupational Health - Occupational Stress Questionnaire    Feeling of Stress : Not at all  Social Connections: Socially Isolated  (04/02/2023)   Social Connection and Isolation Panel [NHANES]    Frequency of Communication with Friends and Family: Once a week    Frequency of Social Gatherings with Friends and Family: More than three times a week    Attends Religious Services: Never    Database administrator or Organizations: No    Attends Engineer, structural: Never    Marital Status: Divorced   Social History   Tobacco Use  Smoking Status Former   Current packs/day: 0.00   Average packs/day: 1 pack/day for 35.0 years (35.0 ttl pk-yrs)   Types: Cigarettes   Start date: 05/1981   Quit date: 05/2016   Years since quitting: 6.9   Passive exposure: Past  Smokeless Tobacco Never   Social History   Substance and Sexual Activity  Alcohol Use No   Alcohol/week: 0.0 standard drinks of alcohol   Comment: denies use of any drugs or alcohol   Social History   Substance and Sexual Activity  Drug Use No    Additional pertinent information: lives with room mates, disabled    FAMILY HISTORY  Family History  Problem Relation Age of Onset   Dementia Father    Family Psychiatric History (if known):  None disclosed at this time.    MENTAL STATUS EXAM (MSE)  Mental Status Exam: General Appearance: Fairly Groomed  Orientation:  Full (Time, Place, and Person)  Memory:  Recent;   Good  Concentration:  Concentration: Good  Recall:  Good  Attention  Good  Eye Contact:  Good  Speech:  Clear and Coherent and Normal Rate  Language:  Good  Volume:  Decreased  Mood: Depressed, sad "bad"  Affect:  Congruent  Thought Process:  Linear  Thought Content:  Logical  Suicidal Thoughts:  Yes.  with intent/plan  Homicidal Thoughts:  No  Judgement:  Fair  Insight:  Fair  Psychomotor Activity:  Normal  Akathisia:  No  Fund of Knowledge:  Good    Assets:  Communication Skills Desire for Improvement Housing Social Support  Cognition:  WNL  ADL's:  Intact  AIMS (if indicated):       VITALS  Blood pressure  135/84, pulse (!) 110, temperature 97.8 F (36.6 C), resp. rate (!) 21, height 6\' 2"  (1.88 m), weight (!) 138 kg, SpO2 94%.  LABS  Admission on 04/24/2023  Component Date Value Ref Range Status   Sodium 04/24/2023 131 (L)  135 - 145 mmol/L Final   Potassium 04/24/2023 4.2  3.5 - 5.1 mmol/L Final   Chloride 04/24/2023 95 (L)  98 - 111 mmol/L Final   CO2 04/24/2023 27  22 - 32 mmol/L Final   Glucose, Bld 04/24/2023 220 (H)  70 - 99 mg/dL Final   Glucose reference range applies only to samples taken after fasting for at least 8 hours.   BUN 04/24/2023 13  8 - 23 mg/dL Final   Creatinine, Ser 04/24/2023 0.78  0.61 - 1.24 mg/dL Final   Calcium 08/65/7846 8.5 (L)  8.9 - 10.3 mg/dL Final   Total Protein 96/29/5284 6.8  6.5 - 8.1 g/dL Final   Albumin 13/24/4010 3.3 (L)  3.5 - 5.0 g/dL Final   AST 27/25/3664 20  15 - 41 U/L Final   ALT 04/24/2023 16  0 - 44 U/L Final   Alkaline Phosphatase 04/24/2023 79  38 - 126 U/L Final   Total Bilirubin 04/24/2023 0.3  0.0 - 1.2 mg/dL Final   GFR, Estimated 04/24/2023 >60  >60 mL/min Final   Comment: (NOTE) Calculated using the CKD-EPI Creatinine Equation (2021)    Anion gap 04/24/2023 9  5 - 15 Final   Performed at [facility], [address], [place], [place] [address]   Alcohol, Ethyl (B) 04/24/2023 <10  <10 mg/dL Final   Comment: (NOTE) Lowest detectable limit for serum alcohol is 10 mg/dL.  For medical purposes only. Performed at [facility], [address] [place], [place] [address]    Salicylate Lvl 04/24/2023 <7.0 (L)  7.0 - 30.0 mg/dL Final   Performed at [facility], [address] [place], [place] [address]   Acetaminophen (Tylenol), Serum 04/24/2023 13  10 - 30 ug/mL Final   Comment: (NOTE) Therapeutic concentrations vary significantly. A range of 10-30 ug/mL  may be an effective concentration for many patients. However, some  are best treated at concentrations outside of  this range. Acetaminophen concentrations >150 ug/mL at 4 hours after ingestion  and >50 ug/mL at 12 hours after ingestion are often associated with  toxic reactions.  Performed at [facility], [address], [place], [place] [address]    WBC 04/24/2023 11.1 (H)  4.0 - 10.5 K/uL Final   RBC 04/24/2023 4.12 (L)  4.22 - 5.81 MIL/uL Final   Hemoglobin 04/24/2023 12.6 (L)  13.0 - 17.0 g/dL Final   HCT 95/18/8416 37.5 (L)  39.0 - 52.0 % Final   MCV 04/24/2023 91.0  80.0 - 100.0 fL Final   MCH 04/24/2023 30.6  26.0 - 34.0 pg Final   MCHC 04/24/2023 33.6  30.0 - 36.0 g/dL Final   RDW 60/63/0160 13.1  11.5 - 15.5 % Final   Platelets 04/24/2023 353  150 - 400 K/uL Final   nRBC 04/24/2023 0.0  0.0 - 0.2 % Final   Performed at [facility], [address] [place], [place] [address]   Opiates 04/24/2023 NONE DETECTED  NONE DETECTED Final   Cocaine 04/24/2023 NONE DETECTED  NONE DETECTED Final   Benzodiazepines 04/24/2023 NONE DETECTED  NONE DETECTED Final   Amphetamines 04/24/2023 NONE DETECTED  NONE DETECTED Final   Tetrahydrocannabinol 04/24/2023 NONE DETECTED  NONE DETECTED Final   Barbiturates 04/24/2023 NONE DETECTED  NONE DETECTED Final   Comment: (NOTE) DRUG SCREEN FOR MEDICAL PURPOSES ONLY.  IF CONFIRMATION IS NEEDED FOR ANY PURPOSE, NOTIFY LAB WITHIN 5 DAYS.  LOWEST DETECTABLE LIMITS FOR URINE DRUG SCREEN Drug Class                     Cutoff (ng/mL) Amphetamine and metabolites    1000 Barbiturate and metabolites    200 Benzodiazepine                 200 Opiates and metabolites        300 Cocaine and metabolites        300 THC                            50 Performed at [facility], [address], [place], [place] [address]   No results displayed because visit has over 200 results.      PSYCHIATRIC REVIEW OF SYSTEMS (ROS)  ROS: Notable for the following relevant positive findings: Review of Systems   Psychiatric/Behavioral:  Positive for depression and suicidal ideas. Negative for hallucinations.     Additional findings:      Musculoskeletal: No abnormal movements observed      Gait & Station: Laying/Sitting      Pain Screening: None mentioned throughout conversation       Nutrition & Dental Concerns: No concerns at this time   RISK FORMULATION/ASSESSMENT  Is the patient experiencing any suicidal or homicidal ideations: Yes       Explain if yes: SI with plan to OD on medications; history of attempts Protective factors considered for safety management: access to care, fair insight, willingness to receive treatment, housing  Risk factors/concerns considered for safety management:  Prior attempt Depression Physical illness/chronic pain Age over 46 Hopelessness Male gender Unmarried  Is there a safety management plan with the patient and treatment team to minimize risk factors and promote protective factors: Yes           Explain: Patient currently in the ED, medication management, suicide precautions, inpatient hospitalization  Is crisis care placement or psychiatric hospitalization recommended: Yes     Based on my current evaluation and risk assessment, patient is determined at this time to be at:  High risk  *RISK ASSESSMENT Risk assessment is a dynamic process; it is possible that this patient's condition, and risk level, may change. This should be re-evaluated and managed over time as appropriate. Please re-consult psychiatric consult services if additional assistance is needed in terms of risk assessment and management. If your team decides to discharge this patient, please advise the patient how to best access emergency psychiatric services, or to call 911, if their condition worsens or they feel unsafe in any way.   Assunta Gambles, NP Telepsychiatry Consult Services

## 2023-04-24 NOTE — Progress Notes (Signed)
 TOC discharge meds in a secure bag delivered to pt in room by this RN. Pt given his cell hone so he can call his roommate for a ride home.

## 2023-04-24 NOTE — Progress Notes (Signed)
 Pt called nurse in room. He was sitting up on side of bed. He looked at nurse and stated that he pulled the Rhino rocket out of his L nare when he was sleeping. Nurse saw the [employer] on his bedside table, on top of kleenex, all intact. No active bleeding in either nostrils.

## 2023-04-24 NOTE — ED Triage Notes (Signed)
 Pt BIBA from home. C/o SOB w/exertion for several weeks. Was just dc'd from hospital for bronchitis. On 4L [place], w/baseline SPO2 90-93 d/t COPD.  Pt also endorsing SI, say they have a plan to either use utility knives or OD on oxy.  AOx4

## 2023-04-24 NOTE — Progress Notes (Signed)
 Dr Allena Katz shared that he is aware of Rhino rocket being out of pt's nose per message sent to nurse, as he read note from nurse around this event.

## 2023-04-24 NOTE — Progress Notes (Signed)
 RT is D/C chest vest since pt is set to be discharged.

## 2023-04-24 NOTE — Progress Notes (Signed)
 ENT Note: Rhinorocket was pulled out this morning accidentally. No bleeding. Both balloons intact. ENT was planning to remove packing today regardless. Would recommend 1 week of face mask rather than nasal cannula.  Ayr gel or saline spray two sprays BID PRN to nostril. ENT will f/u as needed  Read Drivers

## 2023-04-24 NOTE — [facility] Counselor (Signed)
 TTS Note:   Date: 04/24/2023 Time: 21:03  Patient was deferred to [facility] for telehealth services. The intake coordinator, Diego Cory, [phone], will provide updates regarding the availability of the [facility] provider to initiate the telehealth session.   The patient's care team has been informed and provided with necessary updates. Should any questions arise regarding the telepsych services with [facility], or if further updates or concerns need to be addressed, please contact the intake coordinator for assistance

## 2023-04-25 MED ORDER — SALINE SPRAY 0.65 % NA SOLN
1.0000 | NASAL | Status: DC | PRN
  Filled 2023-04-25: qty 44

## 2023-04-25 MED ORDER — ALBUTEROL SULFATE HFA 108 (90 BASE) MCG/ACT IN AERS: 2.0000 | INHALATION_SPRAY | RESPIRATORY_TRACT | Status: DC | PRN

## 2023-04-25 MED ORDER — OXCARBAZEPINE 300 MG PO TABS
600.0000 mg | ORAL_TABLET | Freq: Three times a day (TID) | ORAL | Status: DC
  Administered 2023-04-25 – 2023-04-28 (×10): 600 mg via ORAL
  Filled 2023-04-25 (×10): qty 2

## 2023-04-25 MED ORDER — DULOXETINE HCL 30 MG PO CPEP
60.0000 mg | ORAL_CAPSULE | Freq: Two times a day (BID) | ORAL | Status: DC
  Administered 2023-04-25 – 2023-04-28 (×7): 60 mg via ORAL
  Filled 2023-04-25 (×7): qty 2

## 2023-04-25 MED ORDER — DOCUSATE SODIUM 100 MG PO CAPS
200.0000 mg | ORAL_CAPSULE | Freq: Two times a day (BID) | ORAL | Status: DC
  Administered 2023-04-25 – 2023-04-28 (×6): 200 mg via ORAL
  Filled 2023-04-25 (×7): qty 2

## 2023-04-25 MED ORDER — PANTOPRAZOLE SODIUM 40 MG PO TBEC
40.0000 mg | DELAYED_RELEASE_TABLET | Freq: Every day | ORAL | Status: DC
  Administered 2023-04-25 – 2023-04-28 (×4): 40 mg via ORAL
  Filled 2023-04-25 (×4): qty 1

## 2023-04-25 MED ORDER — BISACODYL 5 MG PO TBEC: 10.0000 mg | DELAYED_RELEASE_TABLET | Freq: Every day | ORAL | Status: DC | PRN

## 2023-04-25 MED ORDER — BUSPIRONE HCL 10 MG PO TABS
15.0000 mg | ORAL_TABLET | Freq: Every morning | ORAL | Status: DC
  Administered 2023-04-25 – 2023-04-28 (×4): 15 mg via ORAL
  Filled 2023-04-25 (×4): qty 2

## 2023-04-25 MED ORDER — UMECLIDINIUM BROMIDE 62.5 MCG/ACT IN AEPB
1.0000 | INHALATION_SPRAY | Freq: Every day | RESPIRATORY_TRACT | Status: DC
  Administered 2023-04-27: 1 via RESPIRATORY_TRACT
  Filled 2023-04-25: qty 7

## 2023-04-25 MED ORDER — ARFORMOTEROL TARTRATE 15 MCG/2ML IN NEBU
15.0000 ug | INHALATION_SOLUTION | Freq: Two times a day (BID) | RESPIRATORY_TRACT | Status: DC
  Administered 2023-04-25 – 2023-04-28 (×6): 15 ug via RESPIRATORY_TRACT
  Filled 2023-04-25 (×9): qty 2

## 2023-04-25 MED ORDER — METFORMIN HCL 850 MG PO TABS
850.0000 mg | ORAL_TABLET | Freq: Every evening | ORAL | Status: DC
  Administered 2023-04-25 – 2023-04-27 (×3): 850 mg via ORAL
  Filled 2023-04-25 (×4): qty 1

## 2023-04-25 MED ORDER — POLYVINYL ALCOHOL 1.4 % OP SOLN: 1.0000 [drp] | Freq: Four times a day (QID) | OPHTHALMIC | Status: DC | PRN

## 2023-04-25 MED ORDER — VITAMIN D 25 MCG (1000 UNIT) PO TABS
1000.0000 [IU] | ORAL_TABLET | Freq: Every day | ORAL | Status: DC
  Administered 2023-04-25 – 2023-04-28 (×4): 1000 [IU] via ORAL
  Filled 2023-04-25 (×4): qty 1

## 2023-04-25 MED ORDER — OLANZAPINE 10 MG IM SOLR: 10.0000 mg | Freq: Once | INTRAMUSCULAR | Status: DC | PRN

## 2023-04-25 MED ORDER — AZELASTINE HCL 0.1 % NA SOLN
1.0000 | Freq: Two times a day (BID) | NASAL | Status: DC
  Administered 2023-04-25 – 2023-04-28 (×7): 1 via NASAL
  Filled 2023-04-25: qty 30

## 2023-04-25 MED ORDER — ADULT MULTIVITAMIN W/MINERALS CH
1.0000 | ORAL_TABLET | Freq: Every day | ORAL | Status: DC
  Administered 2023-04-25 – 2023-04-28 (×4): 1 via ORAL
  Filled 2023-04-25 (×4): qty 1

## 2023-04-25 NOTE — ED Notes (Signed)
 Pt stating that he wants to go home, because he has things to do. Explained to pt that he is to be inpatient for his psych complaints. Pt still says he wants to leave. Provider notified, and says pt will need to be IVC.

## 2023-04-25 NOTE — Progress Notes (Signed)
 LCSW Progress Note  [id number]   Victorious Kundinger [facility]  04/25/2023  1:13 PM  Description:   Inpatient Psychiatric Referral  Patient was recommended inpatient per Dahlia Byes  (NP). There are no available beds at [facility], per [facility] Rona Ravens RN. Patient was referred to the following out of network facilities:   Destination  Service Provider Address Phone Fax  [facility] [place] [address] [phone]  [facility] [address], [place] [address] [phone]  [facility] [address] [place] [address] [phone]  [facility] [address] [place], [place] [address] [phone]  [facility] [address] [place] [address] [phone]  [facility] [address], [place] [address] [phone]  [facility] [address] [place] [address] [phone]  [facility] [address] [place] [address] [phone]  [facility] [address], [place] [address] [phone]  [facility] EFAX [address] [place], [place] [phone]  [facility] [address], [place] [address] [phone]  [facility] [address] [place] [address] [phone]   Discharge Information    Situation ongoing, CSW to continue following and update chart as more information becomes available.      [place] Lalita Ebel, MSW, LCSW  04/25/2023 1:13 PM

## 2023-04-25 NOTE — Consult Note (Signed)
 Provider reevaluated patient today and spoke to his roommate Crisoforo Oxford regarding his visit to the ER.  Patient is requesting to leave and he is here under voluntary admission.  Roommate reported that patient has stage 4 Lung Cancer and is on Oxygen 24/7 at home.  Thayer Ohm reports that patient has had two inpatient hospitalization since January.  Roommate states that patient does not show signs of wanting to hurt himself but he knows that the Lung CA diagnosis makes him sad and anxious.   Patient was hospitalized for almost three weeks Medically at [facility] related to breathing issues.  He was discharged yesterday and he came in for to [facility] for suicide ideation and Depression with plan to OD on Pills.  Patient is on Wellbutrin XL 300 mg daily for depression and Trazodone for sleep.  Patient is a Cytogeneticist, is going through stage 4 Lung Cancer.  He has a roommate who is not always home with him most of the time.  His daughter and family are far away in [place] he says and does not want Provider contact his daughter or brother.  Patient will benefit from inpatient Psychiatry hospitalization.  We have resumed home Medications and will seek inpatient Psychiatry car.  For Patient's safety  and stabilization we will continue to pursue inpatient Psychiatry care.  We will contact the [place] [facility] [place] for Bed.  Plan of care review with Dr Marisue Brooklyn is in agreement to pursue Geropsychiatry hospitalization.

## 2023-04-25 NOTE — Clinical Social Work Placement (Signed)
 Disposition Social Work Note:   Mario [name]   04/25/2023   8:12 PM   Patient was recommended inpatient per Dahlia Byes  (NP). There are no available beds at [facility], per [facility] Rona Ravens RN. Patient was referred to the following out of network facilities during this pm shift:   Destination  Service Provider   Address Phone Fax Patient Preferred  [facility]   [place] [address] [phone] --  [facility]   [address], [place] [address] [phone] --  [facility] Center-Geriatric   [address] [place], [place] [address] [phone] --  [facility] Center-Adult   [address] [place], [place] [address] [phone] --  [facility]   [address] [place] [address] [phone] --  [facility]   [address] [place] [address] [phone] --  [facility]   [address] [place] [address] [phone] --  [facility]   [address], [place] [address] [phone] --  [facility]   [address], [place] [address] [phone] --  [address] EFAX   [address] [place], [place] [phone] --  [facility]   [address] [place] [address] [phone] --  [facility]   [address] [place], [place] [address] [phone] --

## 2023-04-25 NOTE — ED Provider Notes (Signed)
 Emergency Medicine Observation Re-evaluation Note  Mario [name] is a 73 y.o. male, seen on rounds today.  Pt initially presented to the ED for complaints of Shortness of Breath and Suicidal Currently, the patient is in hallway room without complaint.  Physical Exam  BP (!) 167/86 (BP Location: Right Arm)   Pulse 95   Temp 97.9 F (36.6 C) (Oral)   Resp 18   Ht 6\' 2"  (1.88 m)   Wt (!) 138 kg   SpO2 95%   BMI 39.06 kg/m  Physical Exam General: Awake, alert Cardiac: Normal rate Lungs: Normal effort Psych: Appropriate mood  ED Course / MDM  EKG:EKG Interpretation Date/Time:  Monday April 24 2023 18:20:41 EST Ventricular Rate:  109 PR Interval:  192 QRS Duration:  105 QT Interval:  339 QTC Calculation: 457 R Axis:   -32  Text Interpretation: Sinus tachycardia Inferior infarct, old Confirmed by Glyn Ade [phone]) on 04/24/2023 11:09:40 PM  I have reviewed the labs performed to date as well as medications administered while in observation.  Recent changes in the last 24 hours include evaluation by TTS, recommending VA placement..  Plan  Current plan is for [facility] at [facility].[name]    Anders Simmonds T, DO 04/25/23 [phone]

## 2023-04-26 MED ORDER — DIPHENHYDRAMINE HCL 25 MG PO CAPS
25.0000 mg | ORAL_CAPSULE | Freq: Once | ORAL | Status: AC
  Administered 2023-04-26: 25 mg via ORAL
  Filled 2023-04-26: qty 1

## 2023-04-26 MED ORDER — METOCLOPRAMIDE HCL 10 MG PO TABS
10.0000 mg | ORAL_TABLET | Freq: Once | ORAL | Status: AC
  Administered 2023-04-26: 10 mg via ORAL
  Filled 2023-04-26: qty 1

## 2023-04-26 NOTE — BH Assessment (Addendum)
 Disposition Social Work Placement Note:  [name] Date: 04/26/2023 Time: 7:50 PM   Update: Received a call from [facility] Intake Coordinator "Cindee Lame," who expressed interest in the patient and is willing to review for admission. However, there was a request for clarification regarding the patient's ability to complete ADLs and a confirmation of his oxygen needs. The patient was deferred to the patient's nurse Jonny Ruiz, RN) to answer these questions, but per the charge nurse, Jonny Ruiz, RN is currently attending to another patient and unable to answer the intake coordinator's questions at this time.   Updates were provided to Jonny Ruiz, RN, and a request was made for him to contact the intake coordinator at [facility] [phone]) when he becomes available to provide the necessary information/details and address any questions regarding the patient's specific medical needs.

## 2023-04-26 NOTE — ED Provider Notes (Signed)
 Patient wanted me to assess him for headache.  Patient states that he has not headaches for weeks now, it is constant, left-sided and over his vertex.  Pt has no associated nausea, vomiting, seizures, loss of consciousness or new visual complains, weakness, numbness, dizziness or gait instability.  Patient denies any jaw pain, claudication to the jaw.  No history of similar pain.  He states that the pain is fairly constant, and does not respond to the high-dose oxycodone he is taking.  He denies any trauma.  We will get CT scan of his head given that it is a new headache for him and he is over 50.  Given that the symptoms have been present now for months, it makes conditions like temporal arteritis less likely.  Patient has no nausea, vomiting, vision change, neurodeficits and his neuroexam is reassuring -therefore doubt that this is a dissection, aneurysm, thrombosis or even temporal arteritis at this time.  We will give him Tylenol, Reglan for his headache.  We will get CT head.     Derwood Kaplan, MD 04/26/23 [phone]

## 2023-04-26 NOTE — ED Notes (Signed)
 ED doctor notified

## 2023-04-26 NOTE — Clinical Social Work Placement (Signed)
 Disposition Social Work Placement Note:  [name] Date: 04/26/2023 Time: 7:58 PM    Update: Received a call from [facility], and the patient is currently under review for bed placement.  Updates were provided to Jonny Ruiz, RN, and he was informed that [facility] is requesting information regarding the patient's ability to complete his ADLs. This question was deferred to the RN, and we are awaiting updates from the nursing staff to provide clarification.

## 2023-04-26 NOTE — ED Provider Notes (Signed)
 Emergency Medicine Observation Re-evaluation Note  Mario [name] is a 73 y.o. male, seen on rounds today.  Pt initially presented to the ED for complaints of Shortness of Breath and Suicidal Currently, the patient is resting comfortably  Physical Exam  BP 135/76 (BP Location: Left Arm)   Pulse 99   Temp (!) 97.4 F (36.3 C) (Oral)   Resp 20   Ht 6\' 2"  (1.88 m)   Wt (!) 138 kg   SpO2 93%   BMI 39.06 kg/m  Physical Exam General: NAD Lungs: Normal effort Psych: Resting comfortably  ED Course / MDM  EKG:EKG Interpretation Date/Time:  Monday April 24 2023 18:20:41 EST Ventricular Rate:  109 PR Interval:  192 QRS Duration:  105 QT Interval:  339 QTC Calculation: 457 R Axis:   -32  Text Interpretation: Sinus tachycardia Inferior infarct, old Confirmed by Glyn Ade [phone]) on 04/24/2023 11:09:40 PM  I have reviewed the labs performed to date as well as medications administered while in observation.  Recent changes in the last 24 hours include evaluation by TTS, recommending placement.  Plan  Awaiting psych placement   Melene Plan, DO 04/26/23 [phone]

## 2023-04-26 NOTE — BH Assessment (Addendum)
 Disposition Social Work Placement  Note:  [name] Date: 04/26/2023 Time: 7:12 PM  Patient was recommended for inpatient care by Dahlia Byes, NP, on 04/25/2023. However, there are no available beds at [facility], as confirmed by [facility] Rona Ravens, RN. Per the AM shift, there are also no beds available at the [facility], as both are currently on diversion and will not review cases while on diversion. Daily calls will be made for updates. Re-faxed referrals to  alternative facilities during pm shift. A noted barrier to placement is the patient's need for oxygen.   Continued Care and Services - Admitted Since 04/24/2023 Expand All  Collapse All Destination  Service Provider   Address Phone Fax Patient Preferred  [facility]   [place] [address] [phone] --  [facility]   [address], [place] [address] [phone] --  [facility] Center-Geriatric   [address] [place], [place] [address] [phone] --  [facility] Center-Adult   [address] [place], [place] [address] [phone] --  [facility]   [address] [place] [address] [phone] --  [facility]   [address] [place] [address] [phone] --  [facility]   [address], [place] [address] [phone] --  [facility]   [address] [place] [address] [phone] --  [facility]   [address], [place] [address] [phone] --  [address] EFAX   [address] [place], [place] [phone] --  [facility]   [address] [place], [name] [address] [phone] --  [facility]   [address] [place], [place] [address] [phone] --  [facility]   [facility] [place], [place] [phone] --  [facility]   [address] [place] [address] [phone] --  [facility]   [address] [place], [place] [address] [phone] --  [facility]   [address] [place], [place] [address] [phone] --  [facility]   [address] [place] [address] [phone] --  [facility]   [address] [place] [address] [phone] [facility]   [facility] [phone] --  [facility]   [address] [place] [address] [phone] --  [facility]   [address] [place] [address] [phone] --  [facility]   [address] [place] [address] [phone] --  [facility]   [address] [place] [address] [phone] --  [facility]   [address] [place] [address] [phone] --  [facility]   [address] [place] [address] [phone] --  [facility]   [address], [place] [address] [phone] --  [facility]   [address] [place] [address] [phone] --  [facility]   [address] [place] [address] [phone] --

## 2023-04-26 NOTE — ED Notes (Signed)
 Pt requesting to be seen by ED doctor d/t on going headachesDr Roselawn texted.

## 2023-04-27 ENCOUNTER — Emergency Department (HOSPITAL_COMMUNITY): Payer: No Typology Code available for payment source

## 2023-04-27 ENCOUNTER — Other Ambulatory Visit: Payer: Self-pay

## 2023-04-27 DIAGNOSIS — F332 Major depressive disorder, recurrent severe without psychotic features: Secondary | ICD-10-CM | POA: Diagnosis not present

## 2023-04-27 LAB — SARS CORONAVIRUS 2 BY RT PCR: SARS Coronavirus 2 by RT PCR: NEGATIVE

## 2023-04-27 NOTE — ED Notes (Signed)
 Patient went to bathroom and ambulated back to bed, O2 sats 84% with HR of 136. Was able to rest and deep breath came back up to 90% 5L and HR 116.

## 2023-04-27 NOTE — Progress Notes (Signed)
 The proposed treatment discussed in conference is for discussion purpose only and is not a binding recommendation.  The patients have not been physically examined, or presented with their treatment options.  Therefore, final treatment plans cannot be decided.

## 2023-04-27 NOTE — ED Provider Notes (Signed)
 Emergency Medicine Observation Re-evaluation Note  Mario [name] is a 73 y.o. male, seen on rounds today.  Pt initially presented to the ED for complaints of Shortness of Breath and Suicidal Currently, the patient is sleeping.  Physical Exam  BP (!) 163/92 (BP Location: Left Arm)   Pulse (!) 103   Temp 98.6 F (37 C) (Oral)   Resp 20   Ht 6\' 2"  (1.88 m)   Wt (!) 138 kg   SpO2 93%   BMI 39.06 kg/m  Physical Exam General: No acute distress Cardiac: Tachycardic Lungs: Clear Psych: Sleeping  ED Course / MDM  EKG:EKG Interpretation Date/Time:  Monday April 24 2023 18:20:41 EST Ventricular Rate:  109 PR Interval:  192 QRS Duration:  105 QT Interval:  339 QTC Calculation: 457 R Axis:   -32  Text Interpretation: Sinus tachycardia Inferior infarct, old Confirmed by Glyn Ade [phone]) on 04/24/2023 11:09:40 PM  I have reviewed the labs performed to date as well as medications administered while in observation.  Recent changes in the last 24 hours include none.  Plan  Current plan is for psychiatric placement.  Currently patient is IVC.    Gwyneth Sprout, MD 04/27/23 [phone]

## 2023-04-27 NOTE — Consult Note (Signed)
 [facility] Consult Follow-up  Patient Name: .Mario [name]  MRN: [id number]  DOB: [birth year]  Consult Order details:  Orders (From admission, onward)     Start     Ordered   04/24/23 2012  CONSULT TO CALL ACT TEAM       Ordering Provider: Glyn Ade, MD  Provider:  (Not yet assigned)  Question:  Reason for Consult?  Answer:  Psych consult   04/24/23 2011             Mode of Visit: In person    Psychiatry Consult Evaluation  Service Date: April 27, 2023 LOS:  LOS: 0 days  Chief Complaint suicidal ideations   Primary Psychiatric Diagnoses   Major Depressive Disorder, Recurrent, Severe, without Psychotic     Features  Suicidal Ideations  Assessment  Mario [name] is a 73 y.o. male admitted: Presented to the ED on 04/24/2023  6:00 PM for suicidal ideations. He carries the psychiatric diagnoses of Major Depressive Disorder, Recurrent, Severe, without Psychotic Features, anxiety, and PTSD and has a past medical history of COPD, chronic respiratory failure with hypoxia on home O2 therapy, DM2, OSA, HTN, BPH, history of lung cancer, and history of pulmonary embolism .    Mario [name], 73 y.o., male patient seen face to face by this provider, consulted with Dr. Enedina Finner; and chart reviewed on 04/27/23.  On evaluation JATERRIUS RICKETSON reports that he is ready to go home, states he has no thoughts of harming himself.  Patient currently denies SI/HI/AVH.  He states that he was angry with his doctor about being a short of breath and says some things that he should not have said, he states that he will not kill himself because he has a daughter and 2 beautiful grandkids that look after him. He states that his physical discomfort was worsening and he has fleeting thoughts to kill himself, but continues to say he could never act on them. He states that he does have a hard time adjusting to his many of medical diagnoses. Patient continues to ask this provider if she  knows anything about the CT scan he had, he states he has been having a lot of headaches and they did a CT scan and he is worried about results. This provider let him know she will have the EDP come and speak with him, so they can interpret the CT scan properly. Patient said "ok". On evaluation today, the patient is sitting up in his bed. He is calm and cooperative during this assessment. His appearance is appropriate for environment. His eye contact is good.  Speech is clear and coherent, normal pace and normal volume, patient does seem to get short of breath at time, he is currently on oxygen and has on his nasal canula. He is alert and oriented x3 to person, place, and situation. He reports his mood as "feeling good today".   Affect is congruent with mood.  Thought process is coherent.  Thought content is within normal limits. He denies auditory and visual hallucinations.  No indication that he is responding to internal stimuli during this assessment.  No delusions elicited during this assessment.  He denies suicidal ideations. He denies homicidal ideations. Appetite and sleep are fair.   Has been admitted at the [facility] in the past for psychiatric concerns, patient states he has a [place] appointment coming up March 6th for psychiatry, he states he receives all his services at the [place]. Lives at home with room  mates, no access to guns. Patient able to contract or safety.   Diagnoses:  Active Hospital problems: Principal Problem:   Major depressive disorder, recurrent episode (HCC) Active Problems:   Suicidal ideation    Plan   ## Psychiatric Medication Recommendations:  Continue patient home medications   ## Medical Decision Making Capacity: Not specifically addressed in this encounter  ## Further Work-up:  -- No further work up needed at this time  EKG or UDS -- most recent EKG on 04/24/23 had QtC of 457   ## Disposition:-- Will monitor patient overnight and reassess patient in the morning, patient  denies SI/HI/AVH. Can contract for safety, will speak with patient roommate Dorna Mai in morning. [facility] and has [facility] scheduled.   ## Behavioral / Environmental: -To minimize splitting of staff, assign one staff person to communicate all information from the team when feasible. or Utilize compassion and acknowledge the patient's experiences while setting clear and realistic expectations for care.    ## Safety and Observation Level:  - Based on my clinical evaluation, I estimate the patient to be at low risk of self harm in the current setting. - At this time, we recommend  routine. This decision is based on my review of the chart including patient's history and current presentation, interview of the patient, mental status examination, and consideration of suicide risk including evaluating suicidal ideation, plan, intent, suicidal or self-harm behaviors, risk factors, and protective factors. This judgment is based on our ability to directly address suicide risk, implement suicide prevention strategies, and develop a safety plan while the patient is in the clinical setting. Please contact our team if there is a concern that risk level has changed.  CSSR Risk Category:C-SSRS RISK CATEGORY: High Risk  Suicide Risk Assessment: Patient has following modifiable risk factors for suicide: none, we will monitor patient overnight, and reassess in the morning for possible discharge. Patient has following non-modifiable or demographic risk factors for suicide: male gender, history of suicide attempt, and psychiatric hospitalization Patient has the following protective factors against suicide: Access to outpatient mental health care, Supportive family, and Supportive friends  Thank you for this consult request. Recommendations have been communicated to the primary team.  We will Will monitor patient overnight and reassess patient in the morning, patient  denies SI/HI/AVH. Can contract for safety, will speak with patient roommate Dorna Mai in morning. [facility] and has appointments scheduled.   at this time.   Alona Bene, PMHNP       History of Present Illness  Relevant Aspects of Hospital ED Course:  Admitted on 04/24/2023 for suicidal ideations.   Patient Report:   SMARAN GAUS, 73 y.o., male patient seen face to face by this provider, consulted with Dr. Enedina Finner; and chart reviewed on 04/27/23.  On evaluation RANVIR RENOVATO reports that he is ready to go home, states he has no thoughts of harming himself.  Patient currently denies SI/HI/AVH.  He states that he was angry with his doctor about being a short of breath and says some things that he should not have said, he states that he will not kill himself because he has a daughter and 2 beautiful grandkids that look after him. He states that his physical discomfort was worsening and he has fleeting thoughts to kill himself, but continues to say he could never act on them. He states that he does have  a hard time adjusting to his many of medical diagnoses. On evaluation today, the patient is sitting up in his bed. He is calm and cooperative during this assessment. His appearance is appropriate for environment. His eye contact is good.  Speech is clear and coherent, normal pace and normal volume, patient does seem to get short of breath at time, he is currently on oxygen and has on his nasal canula. He is alert and oriented x3 to person, place, and situation. He reports his mood as "feeling good today".   Affect is congruent with mood.  Thought process is coherent.  Thought content is within normal limits. He denies auditory and visual hallucinations.  No indication that he is responding to internal stimuli during this assessment.  No delusions elicited during this assessment.  He denies suicidal ideations. He denies homicidal ideations. Appetite  and sleep are fair.   Has been admitted at the [facility] in the past for psychiatric concerns, patient states he has a [place] appointment coming up March 6th for psychiatry, he states he receives all his services at the [facility]. Lives at home with room mates, no access to guns. Patient able to contract or safety.   Psych ROS:  Depression: Denies Anxiety:  Denies Mania (lifetime and current): Denies Psychosis: (lifetime and current): Denies  Collateral information:  Attempted to contact patient roommate Dorna Mai, no answer will attempt again.   Review of Systems  Psychiatric/Behavioral: Negative.       Psychiatric and Social History  Psychiatric History:  Information collected from patient   Prev Dx/Sx: Depression, anxiety, PTSD Current Psych Provider: At the [facility] Meds (current): See above Previous Med Trials: Yes Therapy: None  Prior Psych Hospitalization: Yes Prior Self Harm: Yes Prior Violence: Denies  Family Psych History: Denies Family Hx suicide: Denies  Social History:  Developmental Hx: Patient appears appropriate for age Educational Hx: Graduated high school Occupational Hx: Retired Armed forces operational officer Hx: Denies Living Situation: Lives with roommates Spiritual Hx: Christian Access to weapons/lethal means: Denies  Substance History Alcohol: Denies Type of alcohol none Last Drink unknown Number of drinks per day none History of alcohol withdrawal seizures denies History of DT's Denies Tobacco: Denies Illicit drugs: Denies Prescription drug abuse: Denies Rehab hx: Denies  Exam Findings  Physical Exam:  Vital Signs:  Temp:  [98 F (36.7 C)-98.6 F (37 C)] 98 F (36.7 C) (02/27 1420) Pulse Rate:  [103-126] 110 (02/27 1803) Resp:  [18-20] 18 (02/27 1803) BP: (132-163)/(71-92) 143/87 (02/27 1803) SpO2:  [84 %-95 %] 94 % (02/27 1803) Blood pressure (!) 143/87, pulse (!) 110, temperature 98 F (36.7 C), temperature source Oral, resp. rate 18, height 6\' 2"  (1.88 m), weight  (!) 138 kg, SpO2 94%. Body mass index is 39.06 kg/m.  Physical Exam Vitals and nursing note reviewed.  Psychiatric:        Mood and Affect: Mood is anxious.        Behavior: Behavior normal.     Mental Status Exam: General Appearance: Casual  Orientation:  Full (Time, Place, and Person)  Memory:  Immediate;   Fair Remote;   Fair  Concentration:  Concentration: Fair and Attention Span: Fair  Recall:  Fair  Attention  Fair  Eye Contact:  Fair  Speech:  Clear and Coherent  Language:  Fair  Volume:  Normal  Mood: anxious, but appropriate  Affect:  Appropriate  Thought Process:  Coherent  Thought Content:  WDL  Suicidal Thoughts:  No  Homicidal Thoughts:  No  Judgement:  Fair  Insight:  Fair  Psychomotor Activity:  Normal  Akathisia:  No  Fund of Knowledge:  Fair      Assets:  Manufacturing systems engineer Desire for Improvement Housing Social Support  Cognition:  WNL  ADL's:  Intact  AIMS (if indicated):        Other History   These have been pulled in through the EMR, reviewed, and updated if appropriate.  Family History:  The patient's family history includes Dementia in his father.  Medical History: Past Medical History:  Diagnosis Date   Ankylosing spondylitis lumbar region [facility]) 09/25/2022   Anxiety    Bronchitis    COPD (chronic obstructive pulmonary disease) (HCC)    Depression    History of radiation therapy    Left lung- 10/05/20-10/15/20- Dr. Antony Blackbird   Hypertension    lung ca 09/2020   MI (myocardial infarction) [facility])    ????   On home oxygen therapy    4L/min [place]   OSA (obstructive sleep apnea)    Suicide attempt (HCC)    Tension pneumothorax 06/27/2016   Uveitis     Surgical History: Past Surgical History:  Procedure Laterality Date   BIOPSY  07/03/2021   Procedure: BIOPSY;  Surgeon: Kathi Der, MD;  Location: [facility] ENDOSCOPY;  Service: Gastroenterology;;   BRONCHIAL BIOPSY  07/30/2020   Procedure: BRONCHIAL BIOPSIES;  Surgeon: Josephine Igo, DO;  Location: [facility] ENDOSCOPY;  Service: Pulmonary;;   BRONCHIAL BRUSHINGS  07/30/2020   Procedure: BRONCHIAL BRUSHINGS;  Surgeon: Josephine Igo, DO;  Location: [facility] ENDOSCOPY;  Service: Pulmonary;;   BRONCHIAL NEEDLE ASPIRATION BIOPSY  07/30/2020   Procedure: BRONCHIAL NEEDLE ASPIRATION BIOPSIES;  Surgeon: Josephine Igo, DO;  Location: [facility] ENDOSCOPY;  Service: Pulmonary;;   BRONCHIAL WASHINGS  07/30/2020   Procedure: BRONCHIAL WASHINGS;  Surgeon: Josephine Igo, DO;  Location: [facility] ENDOSCOPY;  Service: Pulmonary;;   BRONCHIAL WASHINGS  04/17/2023   Procedure: BRONCHIAL WASHINGS;  Surgeon: Lorin Glass, MD;  Location: [facility] ENDOSCOPY;  Service: Pulmonary;;   CHEST TUBE INSERTION Left 06/27/2016   CRYOTHERAPY  04/17/2023   Procedure: CRYOTHERAPY;  Surgeon: Lorin Glass, MD;  Location: [facility] ENDOSCOPY;  Service: Pulmonary;;   cryptorchidism     ESOPHAGOGASTRODUODENOSCOPY N/A 07/03/2021   Procedure: ESOPHAGOGASTRODUODENOSCOPY (EGD);  Surgeon: Kathi Der, MD;  Location: [facility] ENDOSCOPY;  Service: Gastroenterology;  Laterality: N/A;   HEMOSTASIS CONTROL  04/17/2023   Procedure: HEMOSTASIS CONTROL;  Surgeon: Lorin Glass, MD;  Location[facility] ENDOSCOPY;  Service: Pulmonary;;   IR PERC PLEURAL DRAIN W/INDWELL CATH W/IMG GUIDE  08/04/2021   IR REMOVAL OF PLURAL CATH W/CUFF  09/01/2021   SKIN CANCER EXCISION     VIDEO BRONCHOSCOPY Left 04/17/2023   Procedure: VIDEO BRONCHOSCOPY WITHOUT FLUORO;  Surgeon: Lorin Glass, MD;  Location: [facility] ENDOSCOPY;  Service: Pulmonary;  Laterality: Left;   VIDEO BRONCHOSCOPY WITH ENDOBRONCHIAL NAVIGATION Left 07/30/2020   Procedure: VIDEO BRONCHOSCOPY WITH ENDOBRONCHIAL NAVIGATION;  Surgeon: Josephine Igo, DO;  Location: [facility] ENDOSCOPY;  Service: Pulmonary;  Laterality: Left;     Medications:   Current Facility-Administered Medications:    acetaminophen (TYLENOL) tablet 1,000 mg, 1,000 mg, Oral, Q6H PRN, Doran Durand, Chase, MD, 1,000 mg at 04/27/23 1041    albuterol (VENTOLIN HFA) 108 (90 Base) MCG/ACT inhaler 2 puff, 2 puff, Inhalation, Q4H PRN, Anders Simmonds T, DO   arformoterol (BROVANA) nebulizer solution 15 mcg, 15 mcg, Nebulization, BID, 15 mcg at 04/27/23 1002 **AND** umeclidinium bromide (INCRUSE ELLIPTA)  62.5 MCG/ACT 1 puff, 1 puff, Inhalation, Daily, Anders Simmonds T, DO, 1 puff at 04/27/23 0954   azelastine (ASTELIN) 0.1 % nasal spray 1 spray, 1 spray, Each Nare, BID, Anders Simmonds T, DO, 1 spray at 04/27/23 1001   bisacodyl (DULCOLAX) EC tablet 10 mg, 10 mg, Oral, Daily PRN, Anders Simmonds T, DO   buPROPion (WELLBUTRIN XL) 24 hr tablet 300 mg, 300 mg, Oral, q morning, Countryman, Chase, MD, 300 mg at 04/27/23 1001   busPIRone (BUSPAR) tablet 15 mg, 15 mg, Oral, q AM, Anders Simmonds T, DO, 15 mg at 04/27/23 1610   cholecalciferol (VITAMIN D3) 25 MCG (1000 UNIT) tablet 1,000 Units, 1,000 Units, Oral, Daily, Anders Simmonds T, DO, 1,000 Units at 04/27/23 9604   cyclobenzaprine (FLEXERIL) tablet 10 mg, 10 mg, Oral, QHS, Countryman, Chase, MD, 10 mg at 04/26/23 2138   diltiazem (CARDIZEM CD) 24 hr capsule 180 mg, 180 mg, Oral, Daily, Countryman, Chase, MD, 180 mg at 04/27/23 0957   docusate sodium (COLACE) capsule 200 mg, 200 mg, Oral, BID, Anders Simmonds T, DO, 200 mg at 04/27/23 5409   DULoxetine (CYMBALTA) DR capsule 60 mg, 60 mg, Oral, BID, Anders Simmonds T, DO, 60 mg at 04/27/23 8119   gabapentin (NEURONTIN) capsule 100 mg, 100 mg, Oral, TID, Glyn Ade, MD, 100 mg at 04/27/23 1706   hydrALAZINE (APRESOLINE) tablet 25 mg, 25 mg, Oral, BID, Glyn Ade, MD, 25 mg at 04/27/23 0956   melatonin tablet 3 mg, 3 mg, Oral, QHS, Countryman, Chase, MD, 3 mg at 04/26/23 2138   metFORMIN (GLUCOPHAGE) tablet 850 mg, 850 mg, Oral, QPM, Anders Simmonds T, DO, 850 mg at 04/27/23 1813   multivitamin with minerals tablet 1 tablet, 1 tablet, Oral, Daily, Anders Simmonds T, DO, 1 tablet at 04/27/23 0956   OLANZapine (ZYPREXA) injection 10  mg, 10 mg, Intramuscular, Once PRN, Hunt, Katlin E, NP   Oxcarbazepine (TRILEPTAL) tablet 600 mg, 600 mg, Oral, TID, Anders Simmonds T, DO, 600 mg at 04/27/23 1706   oxyCODONE (Oxy IR/ROXICODONE) immediate release tablet 10 mg, 10 mg, Oral, BID PRN, Glyn Ade, MD, 10 mg at 04/27/23 1709   pantoprazole (PROTONIX) EC tablet 40 mg, 40 mg, Oral, Daily, Anders Simmonds T, DO, 40 mg at 04/27/23 1478   polyvinyl alcohol (LIQUIFILM TEARS) 1.4 % ophthalmic solution 1 drop, 1 drop, Both Eyes, QID PRN, Anders Simmonds T, DO   sodium chloride (OCEAN) 0.65 % nasal spray 1 spray, 1 spray, Each Nare, PRN, Anders Simmonds T, DO   tamsulosin (FLOMAX) capsule 0.8 mg, 0.8 mg, Oral, Daily, Countryman, Chase, MD, 0.8 mg at 04/27/23 0957   traZODone (DESYREL) tablet 75 mg, 75 mg, Oral, QHS, Countryman, Almeta Monas, MD, 75 mg at 04/26/23 2137  Current Outpatient Medications:    albuterol (PROVENTIL) (2.5 MG/3ML) 0.083% nebulizer solution, Take 2.5 mg by nebulization every 6 (six) hours as needed for wheezing or shortness of breath., Disp: , Rfl:    albuterol (VENTOLIN HFA) 108 (90 Base) MCG/ACT inhaler, Inhale 2 puffs into the lungs every 4 (four) hours as needed for wheezing or shortness of breath., Disp: , Rfl:    cyclobenzaprine (FLEXERIL) 10 MG tablet, Take 10 mg by mouth at bedtime., Disp: , Rfl:    gabapentin (NEURONTIN) 100 MG capsule, Take 100 mg by mouth 3 (three) times daily., Disp: , Rfl:    OXYGEN, Inhale 5 L/min into the lungs daily., Disp: , Rfl:    traZODone (DESYREL) 50 MG tablet, Take 75 mg by mouth at  bedtime., Disp: , Rfl:    acetaminophen (TYLENOL) 500 MG tablet, Take 1,000 mg by mouth every 6 (six) hours as needed for mild pain (pain score 1-3) or moderate pain (pain score 4-6). (Patient not taking: Reported on 04/26/2023), Disp: , Rfl:    azelastine (ASTELIN) 0.1 % nasal spray, Place 1 spray into both nostrils 2 (two) times daily. Use in each nostril as directed (Patient not taking: Reported on  04/26/2023), Disp: , Rfl:    bisacodyl (DULCOLAX) 5 MG EC tablet, Take 2 tablets (10 mg total) by mouth daily as needed for moderate constipation. (Patient not taking: Reported on 04/26/2023), Disp: 30 tablet, Rfl: 0   buPROPion (WELLBUTRIN XL) 300 MG 24 hr tablet, Take 300 mg by mouth every morning. (Patient not taking: Reported on 04/26/2023), Disp: , Rfl:    busPIRone (BUSPAR) 15 MG tablet, Take 15 mg by mouth in the morning. (Patient not taking: Reported on 04/26/2023), Disp: , Rfl:    Calcium Carbonate (CALCIUM 500 PO), Take 1 tablet by mouth daily. (Patient not taking: Reported on 04/26/2023), Disp: , Rfl:    Carboxymethylcellulose Sod PF 0.5 % SOLN, Place 1 drop into both eyes 4 (four) times daily as needed (dry eyes). (Patient not taking: Reported on 04/26/2023), Disp: , Rfl:    cholecalciferol (VITAMIN D3) 25 MCG (1000 UNIT) tablet, Take 1,000 Units by mouth daily. (Patient not taking: Reported on 04/26/2023), Disp: , Rfl:    cycloSPORINE (RESTASIS) 0.05 % ophthalmic emulsion, Place 1 drop into both eyes every 12 (twelve) hours. (Patient not taking: Reported on 04/26/2023), Disp: , Rfl:    diltiazem (CARDIZEM CD) 180 MG 24 hr capsule, Take 180 mg by mouth in the morning. (Patient not taking: Reported on 04/26/2023), Disp: , Rfl:    docusate sodium (COLACE) 100 MG capsule, Take 200 mg by mouth 2 (two) times daily. (Patient not taking: Reported on 04/26/2023), Disp: , Rfl:    DULoxetine (CYMBALTA) 60 MG capsule, Take 60 mg by mouth 2 (two) times daily. (Patient not taking: Reported on 04/26/2023), Disp: , Rfl:    hydrALAZINE (APRESOLINE) 25 MG tablet, Take 1 tablet (25 mg total) by mouth 2 (two) times daily. (Patient not taking: Reported on 04/26/2023), Disp: 90 tablet, Rfl: 2   LACTULOSE PO, Take 2 Scoops by mouth 2 (two) times daily as needed. 2 tablespoonfuls (Patient not taking: Reported on 04/26/2023), Disp: , Rfl:    Melatonin 3 MG CAPS, Take 3 mg by mouth at bedtime. (Patient not taking: Reported on  04/26/2023), Disp: , Rfl:    metFORMIN (GLUCOPHAGE) 850 MG tablet, Take 850 mg by mouth every evening. (Patient not taking: Reported on 04/26/2023), Disp: , Rfl:    Nutritional Supplements (ENSURE ORIGINAL) LIQD, Take 1 Bottle by mouth daily as needed (in between meals). (Patient not taking: Reported on 04/26/2023), Disp: , Rfl:    omeprazole (PRILOSEC) 20 MG capsule, Take 1 capsule (20 mg total) by mouth daily. (Patient not taking: Reported on 04/26/2023), Disp: 30 capsule, Rfl: 0   Oxcarbazepine (TRILEPTAL) 300 MG tablet, Take 600 mg by mouth in the morning, at noon, and at bedtime. (Patient not taking: Reported on 04/26/2023), Disp: , Rfl:    Oxycodone HCl 10 MG TABS, Take 1 tablet (10 mg total) by mouth 2 (two) times daily as needed for moderate pain (pain score 4-6) or severe pain (pain score 7-10). (Patient not taking: Reported on 04/26/2023), Disp: 30 tablet, Rfl: 0   psyllium (HYDROCIL/METAMUCIL) 95 % PACK, Take 1 packet by  mouth daily. (Patient not taking: Reported on 04/26/2023), Disp: 30 packet, Rfl: 0   sodium chloride (OCEAN) 0.65 % SOLN nasal spray, Place 1 spray into both nostrils daily at 2 am. (Patient not taking: Reported on 04/26/2023), Disp: , Rfl:    tamsulosin (FLOMAX) 0.4 MG CAPS capsule, Take 0.4 mg by mouth daily. (Patient not taking: Reported on 04/26/2023), Disp: , Rfl:    Tiotropium Bromide-Olodaterol 2.5-2.5 MCG/ACT AERS, Inhale 2 each into the lungs every morning. 2 puffs (Patient not taking: Reported on 04/26/2023), Disp: , Rfl:   Allergies: Allergies  Allergen Reactions   Demerol [Meperidine] Nausea And Vomiting and Other (See Comments)    Made the patient "violently sick"   Zocor [Simvastatin] Nausea And Vomiting and Other (See Comments)    Made him very jittery, also   Beet [Beta Vulgaris] Nausea And Vomiting   Liver Nausea And Vomiting    Any Liver    Roshawn Lacina MOTLEY-MANGRUM, PMHNP

## 2023-04-27 NOTE — Progress Notes (Signed)
 Patient has been denied by [facility] due to no appropriate beds available. Patient meets BH inpatient criteria per Phebe Colla, NP. Patient has been faxed out to the following facilities:   [facility] [place] [address] [phone]  [facility] [address], [place] [address] [phone]  [facility] [address] [place] [address] [phone]  [facility] [address] [place], [place] [address] [phone]  [facility] [address] [place] [address] [phone]  [facility] [address], [place] [address] [phone]  [facility] [address], [place] [address] [phone]  [facility] [address], [place] [address] [phone]  [facility] [address] [place] [address] [phone]  [facility] [address] [place], [place] [phone]  [facility] [address], [name] [address] [phone]  [facility] [address] [place], [place] [address] [phone]  [facility] Patient Placement [facility] [place], [place] [phone]  [facility] [address] [place] [address] [phone]  [facility] [address] [place], [place] [address] [phone]  [facility] [address] [place], [place] [address] [phone]  [facility] [address] [place] [address] [phone]  [facility] [address], [place] [address]  [phone]  [facility] [place] [phone]  [facility] [address] [place] [address] [phone]  [facility] [address] [place] [address] [phone]  [facility] [address] [place] [address] [phone]  [facility] [address] [place] [address] [phone]  [facility] [address] [place] [address] [phone]  [facility] [address] [place] [address] [phone]  [facility] [address] [place] [address] [phone]  [facility] [address] [place] [address] [phone]  [facility] [address] [place] [address] [phone]   Damita Dunnings, MSW, LCSW-A  10:06 PM 04/27/2023

## 2023-04-27 NOTE — ED Notes (Signed)
 Patient ambulatory but needs assistance with self care. Currently on 5L of O2

## 2023-04-27 NOTE — Progress Notes (Signed)
 LCSW Progress Note  [id number]   [facility]  04/27/2023  3:44 PM  Description:   Inpatient Psychiatric Referral  Patient was recommended inpatient per Phebe Colla Np). There are [facility] beds at [facility], per [facility] Rona Ravens RN). Patient was referred to the following out of network facilities:    Destination  Service Provider Request Status Services Address Phone Fax Patient Preferred  [facility] Accepted -- [address], [place] [address] [phone] --  [facility] [place] Pending - Request Rhina Brackett [place] [address] [phone] --  [facility] Pending - Request Sent -- [address], [place] [address] [phone] --  [facility] Pending - Request Sent -- [address] [place] [address] [phone] --  [facility] Pending - Request Sent -- [address] [place] [address] [phone] --  [facility] Pending - Request Sent -- [address] [place] [address] [phone] --  [facility] Pending - Request Sent -- [address] [place] [address] [phone] --  [facility] Pending - Request Sent -- [address], [place] [address] [phone] --  [facility] Pending - Request Sent -- [address] [place] [address] [phone] --  [facility] Pending - Request Sent -- [address] [place] [address] [phone] --  [facility] Pending - Request Sent -- [address] Karolee Ohs [place] [phone] --  [facility] Pending - Request Sent -- [address] [place] [address] [phone] --  [facility] Pending  - Request Sent -- [address] [place] [address] [phone] --  [facility] Patient Placement Pending - Request Sent -- [facility], [place] [phone] --  [facility] Pending - Request Sent -- [address], [place] [address] [phone] --  [facility] Pending - Request Sent -- [address] [place], [place] [address] [phone] --  [facility] Pending - Request Sent -- [address] [place], [place] [address] [phone] --  [facility] Pending - Request Sent -- [address] [place] [address] [phone] --  [facility] Pending - Request Sent -- [address] [place] [address] [phone] --  [facility] BED Management Behavioral Health Pending - Request Sent -- [place] [phone] --  [facility] Pending - Request Sent -- [address] Marylou [place] [address] [phone] --  [facility] Pending - Request Sent -- [address] [place] [address] [phone] --  [facility] Pending - Request Sent -- [address], [place] [address] [phone] --  [facility] Pending - Request Sent -- [address] [place] [address] [phone] --  [facility] Pending - Request Sent -- [address] [place] [address] [phone] --  [facility] Pending - Request Sent -- [address], [place] [address] [phone] --  [facility] Pending - Request Sent -- [address], [place] [address] [phone] --  [facility]  Pending - Request Sent -- [address] [place] [address] [phone] --  [facility]  Health Pending - Request Sent -- [address] [place] [address] [phone]       Situation ongoing, CSW to continue following and update chart as more information becomes available.     [place] Daisha Filosa, MSW, LCSW  04/27/2023 3:44 PM

## 2023-04-28 ENCOUNTER — Emergency Department (HOSPITAL_COMMUNITY): Payer: No Typology Code available for payment source

## 2023-04-28 ENCOUNTER — Inpatient Hospital Stay (HOSPITAL_COMMUNITY)
Admission: EM | Admit: 2023-04-28 | Discharge: 2023-05-05 | DRG: 193 | Disposition: A | Discharge status: Home Health | Payer: No Typology Code available for payment source | Attending: Internal Medicine | Admitting: Internal Medicine

## 2023-04-28 ENCOUNTER — Other Ambulatory Visit: Payer: Self-pay

## 2023-04-28 DIAGNOSIS — Z7189 Other specified counseling: Secondary | ICD-10-CM

## 2023-04-28 DIAGNOSIS — I2699 Other pulmonary embolism without acute cor pulmonale: Secondary | ICD-10-CM | POA: Diagnosis present

## 2023-04-28 DIAGNOSIS — Z66 Do not resuscitate: Secondary | ICD-10-CM | POA: Diagnosis present

## 2023-04-28 DIAGNOSIS — J9621 Acute and chronic respiratory failure with hypoxia: Principal | ICD-10-CM | POA: Diagnosis present

## 2023-04-28 DIAGNOSIS — Z1152 Encounter for screening for COVID-19: Secondary | ICD-10-CM

## 2023-04-28 DIAGNOSIS — Z9981 Dependence on supplemental oxygen: Secondary | ICD-10-CM

## 2023-04-28 DIAGNOSIS — J189 Pneumonia, unspecified organism: Secondary | ICD-10-CM | POA: Diagnosis not present

## 2023-04-28 DIAGNOSIS — Z923 Personal history of irradiation: Secondary | ICD-10-CM

## 2023-04-28 DIAGNOSIS — J441 Chronic obstructive pulmonary disease with (acute) exacerbation: Secondary | ICD-10-CM | POA: Diagnosis present

## 2023-04-28 DIAGNOSIS — Z6839 Body mass index (BMI) 39.0-39.9, adult: Secondary | ICD-10-CM

## 2023-04-28 DIAGNOSIS — Z79899 Other long term (current) drug therapy: Secondary | ICD-10-CM

## 2023-04-28 DIAGNOSIS — I252 Old myocardial infarction: Secondary | ICD-10-CM

## 2023-04-28 DIAGNOSIS — J439 Emphysema, unspecified: Secondary | ICD-10-CM | POA: Diagnosis present

## 2023-04-28 DIAGNOSIS — I10 Essential (primary) hypertension: Secondary | ICD-10-CM | POA: Diagnosis present

## 2023-04-28 DIAGNOSIS — G4733 Obstructive sleep apnea (adult) (pediatric): Secondary | ICD-10-CM | POA: Diagnosis present

## 2023-04-28 DIAGNOSIS — J440 Chronic obstructive pulmonary disease with acute lower respiratory infection: Secondary | ICD-10-CM | POA: Diagnosis present

## 2023-04-28 DIAGNOSIS — J449 Chronic obstructive pulmonary disease, unspecified: Secondary | ICD-10-CM | POA: Diagnosis present

## 2023-04-28 DIAGNOSIS — Z85828 Personal history of other malignant neoplasm of skin: Secondary | ICD-10-CM

## 2023-04-28 DIAGNOSIS — C3492 Malignant neoplasm of unspecified part of left bronchus or lung: Secondary | ICD-10-CM | POA: Diagnosis present

## 2023-04-28 DIAGNOSIS — Z86711 Personal history of pulmonary embolism: Secondary | ICD-10-CM

## 2023-04-28 DIAGNOSIS — Z7722 Contact with and (suspected) exposure to environmental tobacco smoke (acute) (chronic): Secondary | ICD-10-CM | POA: Diagnosis present

## 2023-04-28 DIAGNOSIS — F4325 Adjustment disorder with mixed disturbance of emotions and conduct: Secondary | ICD-10-CM | POA: Diagnosis present

## 2023-04-28 DIAGNOSIS — M25511 Pain in right shoulder: Secondary | ICD-10-CM | POA: Diagnosis not present

## 2023-04-28 DIAGNOSIS — Z87891 Personal history of nicotine dependence: Secondary | ICD-10-CM

## 2023-04-28 DIAGNOSIS — E66812 Obesity, class 2: Secondary | ICD-10-CM | POA: Diagnosis present

## 2023-04-28 DIAGNOSIS — F32A Depression, unspecified: Secondary | ICD-10-CM | POA: Diagnosis present

## 2023-04-28 DIAGNOSIS — Z888 Allergy status to other drugs, medicaments and biological substances status: Secondary | ICD-10-CM

## 2023-04-28 DIAGNOSIS — F419 Anxiety disorder, unspecified: Secondary | ICD-10-CM | POA: Diagnosis present

## 2023-04-28 DIAGNOSIS — Z91018 Allergy to other foods: Secondary | ICD-10-CM

## 2023-04-28 DIAGNOSIS — F332 Major depressive disorder, recurrent severe without psychotic features: Secondary | ICD-10-CM

## 2023-04-28 DIAGNOSIS — Z515 Encounter for palliative care: Secondary | ICD-10-CM

## 2023-04-28 DIAGNOSIS — Z9151 Personal history of suicidal behavior: Secondary | ICD-10-CM

## 2023-04-28 DIAGNOSIS — Z7984 Long term (current) use of oral hypoglycemic drugs: Secondary | ICD-10-CM

## 2023-04-28 DIAGNOSIS — E119 Type 2 diabetes mellitus without complications: Secondary | ICD-10-CM

## 2023-04-28 DIAGNOSIS — N400 Enlarged prostate without lower urinary tract symptoms: Secondary | ICD-10-CM | POA: Diagnosis present

## 2023-04-28 LAB — CBC WITH DIFFERENTIAL/PLATELET
Abs Immature Granulocytes: 0.03 10*3/uL (ref 0.00–0.07)
Basophils Absolute: 0.1 10*3/uL (ref 0.0–0.1)
Basophils Relative: 1 %
Eosinophils Absolute: 0.2 10*3/uL (ref 0.0–0.5)
Eosinophils Relative: 2 %
HCT: 39.7 % (ref 39.0–52.0)
Hemoglobin: 12.7 g/dL — ABNORMAL LOW (ref 13.0–17.0)
Immature Granulocytes: 0 %
Lymphocytes Relative: 15 %
Lymphs Abs: 1.6 10*3/uL (ref 0.7–4.0)
MCH: 29.2 pg (ref 26.0–34.0)
MCHC: 32 g/dL (ref 30.0–36.0)
MCV: 91.3 fL (ref 80.0–100.0)
Monocytes Absolute: 1.1 10*3/uL — ABNORMAL HIGH (ref 0.1–1.0)
Monocytes Relative: 10 %
Neutro Abs: 7.8 10*3/uL — ABNORMAL HIGH (ref 1.7–7.7)
Neutrophils Relative %: 72 %
Platelets: 379 10*3/uL (ref 150–400)
RBC: 4.35 MIL/uL (ref 4.22–5.81)
RDW: 13.2 % (ref 11.5–15.5)
WBC: 10.7 10*3/uL — ABNORMAL HIGH (ref 4.0–10.5)
nRBC: 0 % (ref 0.0–0.2)

## 2023-04-28 LAB — BRAIN NATRIURETIC PEPTIDE: B Natriuretic Peptide: 45.4 pg/mL (ref 0.0–100.0)

## 2023-04-28 LAB — RESP PANEL BY RT-PCR (RSV, FLU A&B, COVID)  RVPGX2
Influenza A by PCR: NEGATIVE
Influenza B by PCR: NEGATIVE
Resp Syncytial Virus by PCR: NEGATIVE
SARS Coronavirus 2 by RT PCR: NEGATIVE

## 2023-04-28 LAB — BLOOD GAS, VENOUS
Acid-Base Excess: 5.8 mmol/L — ABNORMAL HIGH (ref 0.0–2.0)
Bicarbonate: 31.2 mmol/L — ABNORMAL HIGH (ref 20.0–28.0)
O2 Saturation: 61.7 %
Patient temperature: 37
pCO2, Ven: 47 mm[Hg] (ref 44–60)
pH, Ven: 7.43 (ref 7.25–7.43)
pO2, Ven: 38 mm[Hg] (ref 32–45)

## 2023-04-28 LAB — COMPREHENSIVE METABOLIC PANEL
ALT: 17 U/L (ref 0–44)
AST: 18 U/L (ref 15–41)
Albumin: 3.5 g/dL (ref 3.5–5.0)
Alkaline Phosphatase: 82 U/L (ref 38–126)
Anion gap: 11 (ref 5–15)
BUN: 14 mg/dL (ref 8–23)
CO2: 24 mmol/L (ref 22–32)
Calcium: 8.8 mg/dL — ABNORMAL LOW (ref 8.9–10.3)
Chloride: 100 mmol/L (ref 98–111)
Creatinine, Ser: 0.75 mg/dL (ref 0.61–1.24)
GFR, Estimated: >60 mL/min (ref >60)
Glucose, Bld: 135 mg/dL — ABNORMAL HIGH (ref 70–99)
Potassium: 4 mmol/L (ref 3.5–5.1)
Sodium: 135 mmol/L (ref 135–145)
Total Bilirubin: 0.5 mg/dL (ref 0.0–1.2)
Total Protein: 7.1 g/dL (ref 6.5–8.1)

## 2023-04-28 MED ORDER — INSULIN ASPART 100 UNIT/ML IJ SOLN
0.0000 [IU] | Freq: Three times a day (TID) | INTRAMUSCULAR | Status: DC
  Administered 2023-04-29: 2 [IU] via SUBCUTANEOUS
  Administered 2023-04-29 – 2023-05-03 (×8): 1 [IU] via SUBCUTANEOUS
  Filled 2023-04-28: qty 0.09

## 2023-04-28 MED ORDER — ACETAMINOPHEN 325 MG PO TABS
650.0000 mg | ORAL_TABLET | Freq: Four times a day (QID) | ORAL | Status: DC | PRN
  Administered 2023-04-29 – 2023-05-05 (×5): 650 mg via ORAL
  Filled 2023-04-28 (×8): qty 2

## 2023-04-28 MED ORDER — SODIUM CHLORIDE 0.9% FLUSH
3.0000 mL | Freq: Two times a day (BID) | INTRAVENOUS | Status: DC
  Administered 2023-04-28 – 2023-05-05 (×14): 3 mL via INTRAVENOUS

## 2023-04-28 MED ORDER — DIPHENHYDRAMINE HCL 50 MG/ML IJ SOLN
25.0000 mg | Freq: Once | INTRAMUSCULAR | Status: AC
  Administered 2023-04-28: 25 mg via INTRAVENOUS
  Filled 2023-04-28: qty 1

## 2023-04-28 MED ORDER — KETOROLAC TROMETHAMINE 15 MG/ML IJ SOLN
15.0000 mg | Freq: Once | INTRAMUSCULAR | Status: AC
  Administered 2023-04-28: 15 mg via INTRAVENOUS
  Filled 2023-04-28: qty 1

## 2023-04-28 MED ORDER — ONDANSETRON HCL 4 MG/2ML IJ SOLN: 4.0000 mg | Freq: Four times a day (QID) | INTRAMUSCULAR | Status: DC | PRN

## 2023-04-28 MED ORDER — BUSPIRONE HCL 5 MG PO TABS
15.0000 mg | ORAL_TABLET | Freq: Every day | ORAL | Status: DC
Start: 2023-04-29 — End: 2023-05-05
  Administered 2023-04-29 – 2023-05-05 (×7): 15 mg via ORAL
  Filled 2023-04-28 (×3): qty 3
  Filled 2023-04-28: qty 2
  Filled 2023-04-28 (×3): qty 3

## 2023-04-28 MED ORDER — SODIUM CHLORIDE 0.9 % IV SOLN
1.0000 g | Freq: Once | INTRAVENOUS | Status: AC
  Administered 2023-04-28: 1 g via INTRAVENOUS
  Filled 2023-04-28: qty 10

## 2023-04-28 MED ORDER — ARFORMOTEROL TARTRATE 15 MCG/2ML IN NEBU
15.0000 ug | INHALATION_SOLUTION | Freq: Two times a day (BID) | RESPIRATORY_TRACT | Status: DC
  Administered 2023-04-29 – 2023-05-05 (×13): 15 ug via RESPIRATORY_TRACT
  Filled 2023-04-28 (×13): qty 2

## 2023-04-28 MED ORDER — HYDRALAZINE HCL 25 MG PO TABS
25.0000 mg | ORAL_TABLET | Freq: Two times a day (BID) | ORAL | Status: DC
  Administered 2023-04-28 – 2023-05-05 (×14): 25 mg via ORAL
  Filled 2023-04-28 (×14): qty 1

## 2023-04-28 MED ORDER — ACETAMINOPHEN 500 MG PO TABS
1000.0000 mg | ORAL_TABLET | Freq: Once | ORAL | Status: AC
  Administered 2023-04-28: 1000 mg via ORAL
  Filled 2023-04-28: qty 2

## 2023-04-28 MED ORDER — ACETAMINOPHEN 650 MG RE SUPP: 650.0000 mg | Freq: Four times a day (QID) | RECTAL | Status: DC | PRN

## 2023-04-28 MED ORDER — DULOXETINE HCL 60 MG PO CPEP
60.0000 mg | ORAL_CAPSULE | Freq: Two times a day (BID) | ORAL | Status: DC
  Administered 2023-04-28 – 2023-05-05 (×14): 60 mg via ORAL
  Filled 2023-04-28: qty 1
  Filled 2023-04-28 (×2): qty 2
  Filled 2023-04-28 (×11): qty 1

## 2023-04-28 MED ORDER — ONDANSETRON HCL 4 MG PO TABS: 4.0000 mg | ORAL_TABLET | Freq: Four times a day (QID) | ORAL | Status: DC | PRN

## 2023-04-28 MED ORDER — BUPROPION HCL ER (XL) 150 MG PO TB24
300.0000 mg | ORAL_TABLET | Freq: Every morning | ORAL | Status: DC
  Administered 2023-04-29 – 2023-05-05 (×7): 300 mg via ORAL
  Filled 2023-04-28 (×7): qty 2

## 2023-04-28 MED ORDER — DILTIAZEM HCL ER COATED BEADS 180 MG PO CP24
180.0000 mg | ORAL_CAPSULE | Freq: Every day | ORAL | Status: DC
Start: 2023-04-29 — End: 2023-05-05
  Administered 2023-04-30 – 2023-05-05 (×6): 180 mg via ORAL
  Filled 2023-04-28 (×7): qty 1

## 2023-04-28 MED ORDER — BISACODYL 5 MG PO TBEC: 5.0000 mg | DELAYED_RELEASE_TABLET | Freq: Every day | ORAL | Status: DC | PRN

## 2023-04-28 MED ORDER — CYCLOSPORINE 0.05 % OP EMUL
1.0000 [drp] | Freq: Two times a day (BID) | OPHTHALMIC | Status: DC
  Administered 2023-04-29 – 2023-05-05 (×12): 1 [drp] via OPHTHALMIC
  Filled 2023-04-28 (×14): qty 30

## 2023-04-28 MED ORDER — MELATONIN 3 MG PO TABS
6.0000 mg | ORAL_TABLET | Freq: Every day | ORAL | Status: DC
  Administered 2023-04-28 – 2023-05-04 (×7): 6 mg via ORAL
  Filled 2023-04-28 (×8): qty 2

## 2023-04-28 MED ORDER — IOHEXOL 350 MG/ML SOLN
75.0000 mL | Freq: Once | INTRAVENOUS | Status: AC | PRN
  Administered 2023-04-28: 75 mL via INTRAVENOUS

## 2023-04-28 MED ORDER — CYCLOBENZAPRINE HCL 10 MG PO TABS
10.0000 mg | ORAL_TABLET | Freq: Every evening | ORAL | Status: DC | PRN
  Administered 2023-04-29 – 2023-05-05 (×8): 10 mg via ORAL
  Filled 2023-04-28 (×8): qty 1

## 2023-04-28 MED ORDER — SODIUM CHLORIDE 0.9 % IV SOLN
500.0000 mg | INTRAVENOUS | Status: DC
  Administered 2023-04-29 – 2023-04-30 (×2): 500 mg via INTRAVENOUS
  Filled 2023-04-28 (×2): qty 5

## 2023-04-28 MED ORDER — TAMSULOSIN HCL 0.4 MG PO CAPS
0.8000 mg | ORAL_CAPSULE | Freq: Every day | ORAL | Status: DC
Start: 2023-04-29 — End: 2023-05-05
  Administered 2023-04-29 – 2023-05-05 (×7): 0.8 mg via ORAL
  Filled 2023-04-28 (×8): qty 2

## 2023-04-28 MED ORDER — OXCARBAZEPINE 150 MG PO TABS
600.0000 mg | ORAL_TABLET | Freq: Three times a day (TID) | ORAL | Status: DC
  Administered 2023-04-28 – 2023-05-05 (×21): 600 mg via ORAL
  Filled 2023-04-28 (×2): qty 2
  Filled 2023-04-28: qty 4
  Filled 2023-04-28: qty 2
  Filled 2023-04-28: qty 4
  Filled 2023-04-28: qty 2
  Filled 2023-04-28 (×5): qty 4
  Filled 2023-04-28: qty 2
  Filled 2023-04-28 (×3): qty 4
  Filled 2023-04-28: qty 2
  Filled 2023-04-28: qty 4
  Filled 2023-04-28: qty 2
  Filled 2023-04-28 (×4): qty 4
  Filled 2023-04-28: qty 2

## 2023-04-28 MED ORDER — GUAIFENESIN ER 600 MG PO TB12
600.0000 mg | ORAL_TABLET | Freq: Two times a day (BID) | ORAL | Status: DC
  Administered 2023-04-28 – 2023-05-05 (×14): 600 mg via ORAL
  Filled 2023-04-28 (×14): qty 1

## 2023-04-28 MED ORDER — SODIUM CHLORIDE 0.9 % IV SOLN
2.0000 g | INTRAVENOUS | Status: AC
  Administered 2023-04-29 – 2023-05-03 (×5): 2 g via INTRAVENOUS
  Filled 2023-04-28 (×5): qty 20

## 2023-04-28 MED ORDER — SODIUM CHLORIDE 0.9 % IV SOLN
500.0000 mg | Freq: Once | INTRAVENOUS | Status: AC
  Administered 2023-04-28: 500 mg via INTRAVENOUS
  Filled 2023-04-28: qty 5

## 2023-04-28 MED ORDER — METOCLOPRAMIDE HCL 5 MG/ML IJ SOLN
10.0000 mg | Freq: Once | INTRAMUSCULAR | Status: AC
  Administered 2023-04-28: 10 mg via INTRAVENOUS
  Filled 2023-04-28: qty 2

## 2023-04-28 MED ORDER — GABAPENTIN 100 MG PO CAPS
100.0000 mg | ORAL_CAPSULE | Freq: Three times a day (TID) | ORAL | Status: DC
  Administered 2023-04-28 – 2023-05-05 (×21): 100 mg via ORAL
  Filled 2023-04-28 (×21): qty 1

## 2023-04-28 MED ORDER — ALBUTEROL SULFATE (2.5 MG/3ML) 0.083% IN NEBU: 2.5000 mg | INHALATION_SOLUTION | Freq: Four times a day (QID) | RESPIRATORY_TRACT | Status: DC | PRN

## 2023-04-28 MED ORDER — SENNOSIDES-DOCUSATE SODIUM 8.6-50 MG PO TABS
1.0000 | ORAL_TABLET | Freq: Every evening | ORAL | Status: DC | PRN
  Filled 2023-04-28: qty 1

## 2023-04-28 MED ORDER — UMECLIDINIUM BROMIDE 62.5 MCG/ACT IN AEPB
1.0000 | INHALATION_SPRAY | Freq: Every day | RESPIRATORY_TRACT | Status: DC
  Administered 2023-05-03: 1 via RESPIRATORY_TRACT
  Filled 2023-04-28: qty 7

## 2023-04-28 MED ORDER — TRAZODONE HCL 50 MG PO TABS
75.0000 mg | ORAL_TABLET | Freq: Every day | ORAL | Status: DC
  Administered 2023-04-28 – 2023-05-04 (×7): 75 mg via ORAL
  Filled 2023-04-28 (×7): qty 2

## 2023-04-28 MED ORDER — HYDROCODONE-ACETAMINOPHEN 10-325 MG PO TABS
1.0000 | ORAL_TABLET | Freq: Four times a day (QID) | ORAL | Status: DC | PRN
  Administered 2023-04-29 – 2023-05-05 (×14): 1 via ORAL
  Filled 2023-04-28 (×14): qty 1

## 2023-04-28 MED ORDER — IPRATROPIUM-ALBUTEROL 0.5-2.5 (3) MG/3ML IN SOLN
3.0000 mL | Freq: Once | RESPIRATORY_TRACT | Status: AC
  Administered 2023-04-28: 3 mL via RESPIRATORY_TRACT
  Filled 2023-04-28: qty 3

## 2023-04-28 NOTE — Discharge Instructions (Addendum)
 Please schedule a follow up appointment at the [facility] clinic for your mental health and for your breathing problems.  Continue using your breathing treatments at home.  Remember that you were recently treated in the hospital for pneumonia with antibiotics.  If you oxygen levels are still dropping low (below 88%) despite your normal home oxygen use, you should return to the ER.  *  Discharge recommendations:  Patient is to take medications as prescribed. Please see information for follow-up appointment with psychiatry and therapy. Please follow up with your primary care provider for all medical related needs.   Therapy: We recommend that patient participate in individual therapy to address mental health concerns.  Medications: The patient or guardian is to contact a medical professional and/or outpatient provider to address any new side effects that develop. The patient or guardian should update outpatient providers of any new medications and/or medication changes.   Atypical antipsychotics: If you are prescribed an atypical antipsychotic, it is recommended that your height, weight, BMI, blood pressure, fasting lipid panel, and fasting blood sugar be monitored by your outpatient providers.  Safety:  The patient should abstain from use of illicit substances/drugs and abuse of any medications. If symptoms worsen or do not continue to improve or if the patient becomes actively suicidal or homicidal then it is recommended that the patient return to the closest hospital emergency department, the [facility], or call 911 for further evaluation and treatment. National Suicide Prevention Lifeline 1-800-SUICIDE or (772)528-1054.  About 988 988 offers 24/7 access to trained crisis counselors who can help people experiencing mental health-related distress. People can call or text 988 or chat 988lifeline.org for themselves or if they are worried about a loved one who may need crisis  support.  Crisis Mobile: [facility]:                     (704)538-3317 (for crisis response 24 hours a day) [facility] Hotline:                                            (774) 388-9294   Safety Plan IAM LIPSON will reach out to his pastor/ roommate Kris Mouton, call 911 or call mobile crisis, or go to nearest emergency room if condition worsens or if suicidal thoughts become active Patients' will follow up with [name] for outpatient psychiatric services (therapy/medication management).  The suicide prevention education provided includes the following: Suicide risk factors Suicide prevention and interventions National Suicide Hotline telephone number [facility] assessment telephone number [facility] Emergency Assistance 911 [facility] and/or Residential Mobile Crisis Unit telephone number Request made of family/significant other to:  pastor/ roommate Kris Mouton [employer] (e.g., guns, rifles, knives), all items previously/currently identified as safety concern.   Remove drugs/medications (over the counter, prescriptions, illicit drugs), all items previously/currently identified as a safety concern.

## 2023-04-28 NOTE — ED Triage Notes (Addendum)
 Pt BIBA from home. C/o SOB w/exertion, and headache. SPO2 dropped into 60's after going upstairs. Up to 90s after rest.  COPD- 90-93 at baseline  On 5L [place] at baseline.  Just dc'd this afternoon

## 2023-04-28 NOTE — ED Provider Notes (Signed)
 [place] EMERGENCY DEPARTMENT AT [facility] Provider Note   CSN: [id number] Arrival date & time: 04/28/23  1725     History  Chief Complaint  Patient presents with   Shortness of Breath    Mario [name] is a 73 y.o. male with PMH as listed below who presents BIBA from home. H/o chronic hypoxic resp failure d/t COPD and lung cancer on 5L [place] at baseline. Complex history. C/o SOB w/exertion, and headache. Felt extremely short of breath when going up the stairs at his home today, much more so than normal, and his SPO2 dropped into 60's on his 5L [place]. Up to 90s after rest. Also dropped into 70s when going to the restroom today, which is just a few steps from his chair. Per chart review, patient was Just dc'd this afternoon after being admitted w/ PNA/COPD exacerbation requiring abx/steroids and subsequent evaluation for suicidal ideation. Also has lung cancer. Reports that he had hemoptysis during his admission and received bronchoscopy. Hasn't had more hemoptysis. Also denies CP, f/c. States he is coughing a little more than normal, not significantly productive. No increase in leg swelling.  Recently in ED from 04/24/23-04/28/23 secondary to suicidal ideation. Previously had prolonged hospitalization from 04/01/23-04/24/23 for resp failure in s/o RSV infection, possible PNA treated w/ abx/steroids, hemoptysis. Had previously declined placement.   Past Medical History:  Diagnosis Date   Ankylosing spondylitis lumbar region [facility]) 09/25/2022   Anxiety    Bronchitis    COPD (chronic obstructive pulmonary disease) (HCC)    Depression    History of radiation therapy    Left lung- 10/05/20-10/15/20- Dr. Antony Blackbird   Hypertension    lung ca 09/2020   MI (myocardial infarction) [facility])    ????   On home oxygen therapy    4L/min [place]   OSA (obstructive sleep apnea)    Suicide attempt (HCC)    Tension pneumothorax 06/27/2016   Uveitis        Home Medications Prior to Admission  medications   Medication Sig Start Date End Date Taking? Authorizing Provider  acetaminophen (TYLENOL) 500 MG tablet Take 1,000 mg by mouth every 6 (six) hours as needed for mild pain (pain score 1-3) or moderate pain (pain score 4-6). Patient not taking: Reported on 04/26/2023    [provider]  albuterol (PROVENTIL) (2.5 MG/3ML) 0.083% nebulizer solution Take 2.5 mg by nebulization every 6 (six) hours as needed for wheezing or shortness of breath.    [provider]  albuterol (VENTOLIN HFA) 108 (90 Base) MCG/ACT inhaler Inhale 2 puffs into the lungs every 4 (four) hours as needed for wheezing or shortness of breath. 11/04/21   Glade Lloyd, MD  azelastine (ASTELIN) 0.1 % nasal spray Place 1 spray into both nostrils 2 (two) times daily. Use in each nostril as directed Patient not taking: Reported on 04/26/2023    [provider]  bisacodyl (DULCOLAX) 5 MG EC tablet Take 2 tablets (10 mg total) by mouth daily as needed for moderate constipation. Patient not taking: Reported on 04/26/2023 01/26/23   Arby Barrette, MD  buPROPion (WELLBUTRIN XL) 300 MG 24 hr tablet Take 300 mg by mouth every morning. Patient not taking: Reported on 04/26/2023 02/05/20   [provider]  busPIRone (BUSPAR) 15 MG tablet Take 15 mg by mouth in the morning. Patient not taking: Reported on 04/26/2023    [provider]  Calcium Carbonate (CALCIUM 500 PO) Take 1 tablet by mouth daily. Patient not taking:  Reported on 04/26/2023    [provider]  Carboxymethylcellulose Sod PF 0.5 % SOLN Place 1 drop into both eyes 4 (four) times daily as needed (dry eyes). Patient not taking: Reported on 04/26/2023    [provider]  cholecalciferol (VITAMIN D3) 25 MCG (1000 UNIT) tablet Take 1,000 Units by mouth daily. Patient not taking: Reported on 04/26/2023    [provider]  cyclobenzaprine (FLEXERIL) 10 MG tablet Take 10 mg by mouth at bedtime.    [provider]  cycloSPORINE (RESTASIS) 0.05 % ophthalmic emulsion Place 1 drop into both eyes every 12 (twelve) hours. Patient not taking: Reported on 04/26/2023    [provider]  diltiazem (CARDIZEM CD) 180 MG 24 hr capsule Take 180 mg by mouth in the morning. Patient not taking: Reported on 04/26/2023    [provider]  docusate sodium (COLACE) 100 MG capsule Take 200 mg by mouth 2 (two) times daily. Patient not taking: Reported on 04/26/2023    [provider]  DULoxetine (CYMBALTA) 60 MG capsule Take 60 mg by mouth 2 (two) times daily. Patient not taking: Reported on 04/26/2023    [provider]  gabapentin (NEURONTIN) 100 MG capsule Take 100 mg by mouth 3 (three) times daily.    [provider]  hydrALAZINE (APRESOLINE) 25 MG tablet Take 1 tablet (25 mg total) by mouth 2 (two) times daily. Patient not taking: Reported on 04/26/2023 04/24/23   Alwyn Ren, MD  LACTULOSE PO Take 2 Scoops by mouth 2 (two) times daily as needed. 2 tablespoonfuls Patient not taking: Reported on 04/26/2023    [provider]  Melatonin 3 MG CAPS Take 3 mg by mouth at bedtime. Patient not taking: Reported on 04/26/2023    [provider]  metFORMIN (GLUCOPHAGE) 850 MG tablet Take 850 mg by mouth every evening. Patient not taking: Reported on 04/26/2023    [provider]  Nutritional Supplements (ENSURE ORIGINAL) LIQD Take 1 Bottle by mouth daily as needed (in between meals). Patient not taking: Reported on 04/26/2023    [provider]  omeprazole (PRILOSEC) 20 MG capsule Take 1 capsule (20 mg total) by mouth daily. Patient not taking: Reported on 04/26/2023 01/26/23   Arby Barrette, MD  Oxcarbazepine (TRILEPTAL) 300 MG tablet Take 600 mg by mouth in the morning, at noon, and at bedtime. Patient not taking: Reported on 04/26/2023    [provider]  Oxycodone HCl 10 MG TABS Take 1 tablet (10 mg total) by mouth 2 (two)  times daily as needed for moderate pain (pain score 4-6) or severe pain (pain score 7-10). Patient not taking: Reported on 04/26/2023 04/24/23   Alwyn Ren, MD  OXYGEN Inhale 5 L/min into the lungs daily.    [provider]  psyllium (HYDROCIL/METAMUCIL) 95 % PACK Take 1 packet by mouth daily. Patient not taking: Reported on 04/26/2023 04/02/23   Leatha Gilding, MD  sodium chloride (OCEAN) 0.65 % SOLN nasal spray Place 1 spray into both nostrils daily at 2 am. Patient not taking: Reported on 04/26/2023 04/24/23   Alwyn Ren, MD  tamsulosin (FLOMAX) 0.4 MG CAPS capsule Take 0.4 mg by mouth daily. Patient not taking: Reported on 04/26/2023    [provider]  Tiotropium Bromide-Olodaterol 2.5-2.5 MCG/ACT AERS Inhale 2 each into the lungs every morning. 2 puffs Patient not taking: Reported on 04/26/2023    [provider]  traZODone (DESYREL) 50 MG tablet Take 75 mg by mouth  at bedtime.    [provider]      Allergies    Demerol [meperidine], Zocor [simvastatin], Beet [beta vulgaris], and Liver    Review of Systems   Review of Systems A 10 point review of systems was performed and is negative unless otherwise reported in HPI.  Physical Exam Updated Vital Signs BP (!) 158/98   Pulse (!) 110   Temp 98.3 F (36.8 C) (Oral)   Resp 16   Ht 6\' 2"  (1.88 m)   Wt (!) 138 kg   SpO2 (!) 89%   BMI 39.06 kg/m  Physical Exam General: Normal appearing male, lying in bed.  HEENT: PERRLA, Sclera anicteric, MMM, trachea midline.  Cardiology: RRR, no murmurs/rubs/gallops. BL radial and DP pulses equal bilaterally.  Resp: Satting 92% on 5L [place]. Normal respiratory rate and effort. CTAB, no wheezes, rhonchi, crackles.  Abd: Soft, non-tender, non-distended. No rebound tenderness or guarding.  GU: Deferred. MSK: No peripheral edema or signs of trauma. Extremities without deformity or TTP. No cyanosis or clubbing. Skin: warm, dry. No rashes or  lesions. Back: No CVA tenderness Neuro: A&Ox4, CNs II-XII grossly intact. MAEs. Sensation grossly intact.  Psych: Normal mood and affect.   ED Results / Procedures / Treatments   Labs (all labs ordered are listed, but only abnormal results are displayed) Labs Reviewed  CBC WITH DIFFERENTIAL/PLATELET - Abnormal; Notable for the following components:      Result Value   WBC 10.7 (*)    Hemoglobin 12.7 (*)    Neutro Abs 7.8 (*)    Monocytes Absolute 1.1 (*)    All other components within normal limits  BLOOD GAS, VENOUS  COMPREHENSIVE METABOLIC PANEL  BRAIN NATRIURETIC PEPTIDE    EKG EKG Interpretation Date/Time:  Friday April 28 2023 17:50:10 EST Ventricular Rate:  102 PR Interval:  191 QRS Duration:  113 QT Interval:  345 QTC Calculation: 450 R Axis:   -22  Text Interpretation: Sinus tachycardia Ventricular premature complex Borderline intraventricular conduction delay Confirmed by Vivi Barrack [phone]) on 04/28/2023 6:56:30 PM  Radiology DG Chest Portable 1 View Result Date: 04/28/2023 CLINICAL DATA:  Shortness of breath.  Headache. EXAM: PORTABLE CHEST 1 VIEW COMPARISON:  Chest radiograph dated 04/24/2023 and CT dated 04/06/2023 FINDINGS: Background of emphysema and chronic interstitial coarsening. Bibasilar airspace densities similar to prior radiograph and CT. There is a small right pleural effusion. No pneumothorax. Stable cardiac silhouette no acute osseous pathology. IMPRESSION: No interval change since the prior radiograph. Electronically Signed   By: Elgie Collard M.D.   On: 04/28/2023 18:36   CT Head Wo Contrast Result Date: 04/27/2023 CLINICAL DATA:  Left-sided headaches for weeks, initial encounter EXAM: CT HEAD WITHOUT CONTRAST TECHNIQUE: Contiguous axial images were obtained from the base of the skull through the vertex without intravenous contrast. RADIATION DOSE REDUCTION: This exam was performed according to the departmental dose-optimization program which  includes automated exposure control, adjustment of the mA and/or kV according to patient size and/or use of iterative reconstruction technique. COMPARISON:  10/12/2021 FINDINGS: Brain: No evidence of acute infarction, hemorrhage, hydrocephalus, extra-axial collection or mass lesion/mass effect. Chronic atrophic changes and white matter ischemic changes noted. Vascular: No hyperdense vessel or unexpected calcification. Skull: Normal. Negative for fracture or focal lesion. Sinuses/Orbits: No acute finding. Other: None. IMPRESSION: Chronic atrophic and ischemic changes.  No acute abnormality noted. Electronically Signed   By: Alcide Clever M.D.   On: 04/27/2023 02:57    Procedures Procedures    Medications  Ordered in ED Medications  ipratropium-albuterol (DUONEB) 0.5-2.5 (3) MG/3ML nebulizer solution 3 mL (has no administration in time range)    ED Course/ Medical Decision Making/ A&P                          Medical Decision Making Amount and/or Complexity of Data Reviewed Labs: ordered. Decision-making details documented in ED Course. Radiology: ordered. Decision-making details documented in ED Course.  Risk OTC drugs. Prescription drug management. Decision regarding hospitalization.    This patient presents to the ED for concern of SOB, hypoxia, this involves an extensive number of treatment options, and is a complaint that carries with it a high risk of complications and morbidity.  I considered the following differential and admission for this acute, potentially life threatening condition.   MDM:    DDX for dyspnea includes but is not limited to:  Acute on chronic hypoxic resp failure w/ h/o PNA and COPD on O2 at baseline, was hypoxic on home O2 today, not dissimilar to prior presentation.s No elevated BNP to indicate acute CHF exacerbation. No PE on CT. Does have worsening PNA. Given duoneb here with some improvement, but did not have significant wheezing on presentation.     Clinical Course as of 05/05/23 2103  Fri Apr 28, 2023  1854 DG Chest Portable 1 View FINDINGS: Background of emphysema and chronic interstitial coarsening. Bibasilar airspace densities similar to prior radiograph and CT. There is a small right pleural effusion. No pneumothorax. Stable cardiac silhouette no acute osseous pathology.  IMPRESSION: No interval change since the prior radiograph.   [HN]  1945 B Natriuretic Peptide: 45.4 wnl [HN]  2127 Worsening pneumonia on CT PE. Given azithromycin and ceftriaxone. Admitting to hospitalist. [HN]    Clinical Course User Index [HN] Loetta Rough, MD    Labs: I Ordered, and personally interpreted labs.  The pertinent results include:  those listed above  Imaging Studies ordered: I ordered imaging studies including CXR, CT PE I independently visualized and interpreted imaging. I agree with the radiologist interpretation  Additional history obtained from chart review.   Cardiac Monitoring: The patient was maintained on a cardiac monitor.  I personally viewed and interpreted the cardiac monitored which showed an underlying rhythm of: ST  Reevaluation: After the interventions noted above, I reevaluated the patient and found that they have :improved  Social Determinants of Health: Lives independently  Disposition:  Admitted to hospitalist  Co morbidities that complicate the patient evaluation  Past Medical History:  Diagnosis Date   Ankylosing spondylitis lumbar region (HCC) 09/25/2022   Anxiety    Bronchitis    COPD (chronic obstructive pulmonary disease) (HCC)    Depression    History of radiation therapy    Left lung- 10/05/20-10/15/20- Dr. Antony Blackbird   Hypertension    lung ca 09/2020   MI (myocardial infarction) [facility])    ????   On home oxygen therapy    4L/min [place]   OSA (obstructive sleep apnea)    Suicide attempt (HCC)    Tension pneumothorax 06/27/2016   Uveitis      Medicines Meds ordered this  encounter  Medications   ipratropium-albuterol (DUONEB) 0.5-2.5 (3) MG/3ML nebulizer solution 3 mL    I have reviewed the patients home medicines and have made adjustments as needed  Problem List / ED Course: Problem List Items Addressed This Visit       Respiratory   Acute on chronic hypoxic respiratory failure (  HCC) - Primary   Other Visit Diagnoses       Controlled type 2 diabetes mellitus without complication, without long-term current use of insulin (HCC)       Relevant Orders   Diet Carb Modified                   This note was created using dictation software, which may contain spelling or grammatical errors.    Loetta Rough, MD 05/10/23 [id number]

## 2023-04-28 NOTE — H&P (Signed)
 History and Physical    Mario [name] DOB: August 30, 1950 DOA: 04/28/2023  PCP: [name]  Patient coming from: Home  I have personally briefly reviewed patient's old medical records in [facility]  Chief Complaint: Shortness of breath, hypoxia  HPI: Mario [name] is a 73 y.o. male with medical history significant for COPD, chronic respiratory failure on 4-5 L O2, history of PE now off anticoagulation due to hemoptysis/epistaxis, left lung cancer, T2DM, HTN, BPH, GAD, OSA with recent prolonged admission who returned to the ED from home for evaluation of shortness of breath.  Patient has spent most of the last 6 weeks in the hospital.  He was admitted 03/17/23-04/01/23 for acute on chronic hypoxic respiratory failure in setting of, RSV infection, and COPD exacerbation and subsequently discharged home.  He was readmitted the same day 04/01/23-04/24/23 again for acute on chronic hypoxic respiratory failure.  During this admission he developed hemoptysis while on Eliquis.  CT chest was performed and showed large left lower lobe airspace opacity, small loculated fluid collection in the left lung base, and persistent airspace opacity in the right lower lobe.  He underwent bronchoscopy 2/17, BAL and biopsy consistent with adenocarcinoma.  Also developed significant epistaxis which required nasal packing by ENT.  Eliquis was discontinued.  He completed 7 days of Zosyn for potential superimposed pneumonia.  Patient was discharged to home.  He returned to the ED again same day of discharge 2/24 with shortness of breath as well as suicidal ideation with plan.  He was seen by psychiatry and initially boarded in the ED under IVC awaiting psychiatric placement until today when he was psychiatrically cleared for discharge and returned home this afternoon.  He comes back to the ED today again with shortness of breath and hypoxia at home.  Patient states that after returning home he  rested for a while on the couch.  He went upstairs to use the bathroom and after he stood back up he became very short of breath.  He uses home pulse ox and saw that his SpO2 was down to 68% while using his home 5 L O2 via [place].  After resting for a while SpO2 improved to mid 70s.  He tried his home maintenance and rescue inhalers without significant improvement therefore came back to the ED.  He has chronic cough which is not significantly changed from baseline.  He has not seen any recent hemoptysis.  ED Course  Labs/Imaging on admission: I have personally reviewed following labs and imaging studies.  Vitals showed BP 158/98, pulse 110, RR 16, temp 98.3 F, SpO2 89-92% on home 5 L O2 via [place].  Labs show WBC 10.7, hemoglobin 12.7, platelets 379,000, sodium 135, potassium 4.0, bicarb 24, BUN 14, creatinine 0.75, serum glucose 135, BNP 45.4.  CTA chest negative for PE.  Increased consolidation and groundglass opacities in the right lower lobe compared with prior CT scan noted, findings favor worsening pneumonia.  Similar consolidative and groundglass opacities in the lingula and left lower lobe noted.  Increased small right pleural effusion and similar small left pleural effusion and debris in the left mainstem bronchus also reported.  Patient was given IV ceftriaxone and azithromycin, DuoNeb, Reglan, Toradol.  The hospitalist service was consulted to admit.  Review of Systems: All systems reviewed and are negative except as documented in history of present illness above.   Past Medical History:  Diagnosis Date   Ankylosing spondylitis lumbar region [facility]) 09/25/2022   Anxiety  Bronchitis    COPD (chronic obstructive pulmonary disease) (HCC)    Depression    History of radiation therapy    Left lung- 10/05/20-10/15/20- Dr. Antony Blackbird   Hypertension    lung ca 09/2020   MI (myocardial infarction) [facility])    ????   On home oxygen therapy    4L[facility]   OSA (obstructive sleep apnea)     Suicide attempt (HCC)    Tension pneumothorax 06/27/2016   Uveitis     Past Surgical History:  Procedure Laterality Date   BIOPSY  07/03/2021   Procedure: BIOPSY;  Surgeon: Kathi Der, MD;  Location: [facility] ENDOSCOPY;  Service: Gastroenterology;;   BRONCHIAL BIOPSY  07/30/2020   Procedure: BRONCHIAL BIOPSIES;  Surgeon: Josephine Igo, DO;  Location: [facility] ENDOSCOPY;  Service: Pulmonary;;   BRONCHIAL BRUSHINGS  07/30/2020   Procedure: BRONCHIAL BRUSHINGS;  Surgeon: Josephine Igo, DO;  Location: [facility] ENDOSCOPY;  Service: Pulmonary;;   BRONCHIAL NEEDLE ASPIRATION BIOPSY  07/30/2020   Procedure: BRONCHIAL NEEDLE ASPIRATION BIOPSIES;  Surgeon: Josephine Igo, DO;  Location: [facility] ENDOSCOPY;  Service: Pulmonary;;   BRONCHIAL WASHINGS  07/30/2020   Procedure: BRONCHIAL WASHINGS;  Surgeon: Josephine Igo, DO;  Location: [facility] ENDOSCOPY;  Service: Pulmonary;;   BRONCHIAL WASHINGS  04/17/2023   Procedure: BRONCHIAL WASHINGS;  Surgeon: Lorin Glass, MD;  Location: [facility] ENDOSCOPY;  Service: Pulmonary;;   CHEST TUBE INSERTION Left 06/27/2016   CRYOTHERAPY  04/17/2023   Procedure: CRYOTHERAPY;  Surgeon: Lorin Glass, MD;  Location: [facility] ENDOSCOPY;  Service: Pulmonary;;   cryptorchidism     ESOPHAGOGASTRODUODENOSCOPY N/A 07/03/2021   Procedure: ESOPHAGOGASTRODUODENOSCOPY (EGD);  Surgeon: Kathi Der, MD;  Location: [facility] ENDOSCOPY;  Service: Gastroenterology;  Laterality: N/A;   HEMOSTASIS CONTROL  04/17/2023   Procedure: HEMOSTASIS CONTROL;  Surgeon: Lorin Glass, MD;  Location: [facility] ENDOSCOPY;  Service: Pulmonary;;   IR PERC PLEURAL DRAIN W/INDWELL CATH W/IMG GUIDE  08/04/2021   IR REMOVAL OF PLURAL CATH W/CUFF  09/01/2021   SKIN CANCER EXCISION     VIDEO BRONCHOSCOPY Left 04/17/2023   Procedure: VIDEO BRONCHOSCOPY WITHOUT FLUORO;  Surgeon: Lorin Glass, MD;  Location[facility] ENDOSCOPY;  Service: Pulmonary;  Laterality: Left;   VIDEO BRONCHOSCOPY WITH ENDOBRONCHIAL NAVIGATION Left 07/30/2020   Procedure: VIDEO  BRONCHOSCOPY WITH ENDOBRONCHIAL NAVIGATION;  Surgeon: Josephine Igo, DO;  Location: [facility] ENDOSCOPY;  Service: Pulmonary;  Laterality: Left;    Social History:  reports that he quit smoking about 6 years ago. His smoking use included cigarettes. He started smoking about 41 years ago. He has a 35 pack-year smoking history. He has been exposed to tobacco smoke. He has never used smokeless tobacco. He reports that he does not drink alcohol and does not use drugs.  Allergies  Allergen Reactions   Demerol [Meperidine] Nausea And Vomiting and Other (See Comments)    Made the patient "violently sick"   Zocor [Simvastatin] Nausea And Vomiting and Other (See Comments)    Made him very jittery, also   Beet [Beta Vulgaris] Nausea And Vomiting   Liver Nausea And Vomiting    Any Liver    Family History  Problem Relation Age of Onset   Dementia Father      Prior to Admission medications   Medication Sig Start Date End Date Taking? Authorizing Provider  acetaminophen (TYLENOL) 500 MG tablet Take 1,000 mg by mouth every 6 (six) hours as needed for mild pain (pain score 1-3) or moderate pain (pain score 4-6). Patient  not taking: Reported on 04/26/2023    [provider]  albuterol (PROVENTIL) (2.5 MG/3ML) 0.083% nebulizer solution Take 2.5 mg by nebulization every 6 (six) hours as needed for wheezing or shortness of breath.    [provider]  albuterol (VENTOLIN HFA) 108 (90 Base) MCG/ACT inhaler Inhale 2 puffs into the lungs every 4 (four) hours as needed for wheezing or shortness of breath. 11/04/21   Glade Lloyd, MD  azelastine (ASTELIN) 0.1 % nasal spray Place 1 spray into both nostrils 2 (two) times daily. Use in each nostril as directed Patient not taking: Reported on 04/26/2023    [provider]  bisacodyl (DULCOLAX) 5 MG EC tablet Take 2 tablets (10 mg total) by mouth daily as needed for moderate constipation. Patient not taking: Reported on 04/26/2023 01/26/23    Arby Barrette, MD  buPROPion (WELLBUTRIN XL) 300 MG 24 hr tablet Take 300 mg by mouth every morning. Patient not taking: Reported on 04/26/2023 02/05/20   [provider]  busPIRone (BUSPAR) 15 MG tablet Take 15 mg by mouth in the morning. Patient not taking: Reported on 04/26/2023    [provider]  Calcium Carbonate (CALCIUM 500 PO) Take 1 tablet by mouth daily. Patient not taking: Reported on 04/26/2023    [provider]  Carboxymethylcellulose Sod PF 0.5 % SOLN Place 1 drop into both eyes 4 (four) times daily as needed (dry eyes). Patient not taking: Reported on 04/26/2023    [provider]  cholecalciferol (VITAMIN D3) 25 MCG (1000 UNIT) tablet Take 1,000 Units by mouth daily. Patient not taking: Reported on 04/26/2023    [provider]  cyclobenzaprine (FLEXERIL) 10 MG tablet Take 10 mg by mouth at bedtime.    [provider]  cycloSPORINE (RESTASIS) 0.05 % ophthalmic emulsion Place 1 drop into both eyes every 12 (twelve) hours. Patient not taking: Reported on 04/26/2023    [provider]  diltiazem (CARDIZEM CD) 180 MG 24 hr capsule Take 180 mg by mouth in the morning. Patient not taking: Reported on 04/26/2023    [provider]  docusate sodium (COLACE) 100 MG capsule Take 200 mg by mouth 2 (two) times daily. Patient not taking: Reported on 04/26/2023    [provider]  DULoxetine (CYMBALTA) 60 MG capsule Take 60 mg by mouth 2 (two) times daily. Patient not taking: Reported on 04/26/2023    [provider]  gabapentin (NEURONTIN) 100 MG capsule Take 100 mg by mouth 3 (three) times daily.    [provider]  hydrALAZINE (APRESOLINE) 25 MG tablet Take 1 tablet (25 mg total) by mouth 2 (two) times daily. Patient not taking: Reported on 04/26/2023 04/24/23   Alwyn Ren, MD  LACTULOSE PO Take 2 Scoops by mouth 2 (two) times daily as needed. 2 tablespoonfuls Patient not taking: Reported  on 04/26/2023    [provider]  Melatonin 3 MG CAPS Take 3 mg by mouth at bedtime. Patient not taking: Reported on 04/26/2023    [provider]  metFORMIN (GLUCOPHAGE) 850 MG tablet Take 850 mg by mouth every evening. Patient not taking: Reported on 04/26/2023    [provider]  Nutritional Supplements (ENSURE ORIGINAL) LIQD Take 1 Bottle by mouth daily as needed (in between meals). Patient not taking: Reported on 04/26/2023    [provider]  omeprazole (PRILOSEC) 20 MG capsule Take 1 capsule (20 mg total) by mouth daily. Patient not taking: Reported on 04/26/2023 01/26/23   Arby Barrette, MD  Oxcarbazepine (TRILEPTAL) 300 MG tablet Take 600 mg by mouth in the morning, at noon, and at bedtime. Patient not taking: Reported on 04/26/2023    [provider]  Oxycodone HCl 10 MG TABS Take 1 tablet (10 mg total) by mouth 2 (two) times daily as needed for moderate pain (pain score 4-6) or severe pain (pain score 7-10). Patient not taking: Reported on 04/26/2023 04/24/23   Alwyn Ren, MD  OXYGEN Inhale 5 L/min into the lungs daily.    [provider]  psyllium (HYDROCIL/METAMUCIL) 95 % PACK Take 1 packet by mouth daily. Patient not taking: Reported on 04/26/2023 04/02/23   Leatha Gilding, MD  sodium chloride (OCEAN) 0.65 % SOLN nasal spray Place 1 spray into both nostrils daily at 2 am. Patient not taking: Reported on 04/26/2023 04/24/23   Alwyn Ren, MD  tamsulosin (FLOMAX) 0.4 MG CAPS capsule Take 0.4 mg by mouth daily. Patient not taking: Reported on 04/26/2023    [provider]  Tiotropium Bromide-Olodaterol 2.5-2.5 MCG/ACT AERS Inhale 2 each into the lungs every morning. 2 puffs Patient not taking: Reported on 04/26/2023    [provider]  traZODone (DESYREL) 50 MG tablet Take 75 mg by mouth at bedtime.    [provider]    Physical Exam: Vitals:   04/28/23 1745 04/28/23 1748 04/28/23 1900  04/28/23 2245  BP:  (!) 158/98 (!) 163/104 (!) 142/86  Pulse: (!) 109 (!) 110 (!) 106 (!) 102  Resp:  16 19 18   Temp: 98.3 F (36.8 C)     TempSrc: Oral     SpO2: 92% (!) 89% 92% 94%  Weight: (!) 138 kg     Height: 6\' 2"  (1.88 m)      Constitutional: Sitting up on the side of the bed, NAD, calm, comfortable Eyes: EOMI, lids and conjunctivae normal ENMT: Mucous membranes are moist. Posterior pharynx clear of any exudate or lesions.Normal dentition.  Neck: normal, supple, no masses. Respiratory: Distant breath sounds with faint bibasilar crackles. Normal respiratory effort while on 5 L O2 via [place]. No accessory muscle use.  Cardiovascular: Regular rate and rhythm, no murmurs / rubs / gallops. No extremity edema. 2+ pedal pulses. Abdomen: no tenderness, no masses palpated.  Musculoskeletal: no clubbing / cyanosis. No joint deformity upper and lower extremities. Good ROM, no contractures. Normal muscle tone.  Skin: no rashes, lesions, ulcers. No induration Neurologic: Sensation intact. Strength 5/5 in all 4.  Psychiatric: Alert and oriented x 3. Normal mood.   EKG: Personally reviewed. Sinus rhythm, rate 102, PVC present.  Assessment/Plan Principal Problem:   Right lower lobe pneumonia Active Problems:   Adjustment disorder with mixed disturbance of emotions and conduct   Acute on chronic hypoxic respiratory failure (HCC)   DM2 (diabetes mellitus, type 2) (HCC)   COPD (chronic obstructive pulmonary disease) (HCC)   Non-small cell cancer of 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   Essential hypertension   history of Pulmonary embolism (HCC)   BPH (benign prostatic hyperplasia)   OSA (obstructive sleep apnea)   Anxiety   Mario [name] is a 73 y.o. male with medical history significant for COPD, chronic respiratory failure on 4-5 L O2, history of PE now off anticoagulation due to hemoptysis/epistaxis, left lung cancer, T2DM, HTN, BPH, GAD, OSA who is admitted with  persistent/progressive right lower lobe pneumonia.  Assessment and Plan: Acute on chronic hypoxic respiratory failure on 4-5 L O2 via [place] at baseline Persistent/progressive right  lower lobe consolidation/infiltrates COPD Left lung adenocarcinoma: Readmission for recurrent exertional hypoxia with SpO2 in the 60s PTA.  CTA chest shows increased consolidation and groundglass opacities right lower lobe, question worsening pneumonia versus noninfectious infiltrate/inflammation as well as similar left lower lobe and lingular consolidation and groundglass opacities.  SpO2 stable on home 5 L [place] at time of admission while patient is resting.  Overall suspect patient is not far from his poor baseline respiratory status which is easily exacerbated with exertion.  For now we will continue empiric antibiotics. -Continue IV ceftriaxone and azithromycin -Continue supplemental oxygen at 4-5 L O2 via [place] to keep SpO2 >88% -Continue Brovana/Incruse and albuterol as needed -Needs outpatient follow-up with his oncologist  History of PE: Off anticoagulation indefinitely due to recent hemoptysis and epistaxis.  Type 2 diabetes: Placed on SSI.  Hypertension: Continue diltiazem and hydralazine.  BPH: Continue Flomax.  Depression/anxiety: Recently held in ED for SI and subsequently cleared by psychiatry.  Patient denies any current suicidal ideation.  Will resume his multiple home medications. -Continue Wellbutrin, BuSpar, Cymbalta, Trileptal, trazodone  OSA: Continue CPAP nightly.   DVT prophylaxis: SCDs Start: 04/28/23 2256 Code Status: Full code, discussed with patient on admission Family Communication: Discussed with patient Disposition Plan: From home, dispo pending clinical progress Consults called: None Severity of Illness: The appropriate patient status for this patient is OBSERVATION. Observation status is judged to be reasonable and necessary in order to provide the required intensity of service  to ensure the patient's safety. The patient's presenting symptoms, physical exam findings, and initial radiographic and laboratory data in the context of their medical condition is felt to place them at decreased risk for further clinical deterioration. Furthermore, it is anticipated that the patient will be medically stable for discharge from the hospital within 2 midnights of admission.   Darreld Mclean MD [facility]  If 7PM-7AM, please contact night-coverage [email or web address]  04/28/2023, 11:21 PM

## 2023-04-28 NOTE — Consult Note (Signed)
 [facility] Consult Follow-up  Patient Name: .Mario [name]  MRN: [id number]  DOB: [birth year]  Consult Order details:  Orders (From admission, onward)     Start     Ordered   04/24/23 2012  CONSULT TO CALL ACT TEAM       Ordering Provider: Glyn Ade, MD  Provider:  (Not yet assigned)  Question:  Reason for Consult?  Answer:  Psych consult   04/24/23 2011             Mode of Visit: In person    Psychiatry Consult Evaluation  Service Date: April 28, 2023 LOS:  LOS: 0 days  Chief Complaint suicidal ideations   Primary Psychiatric Diagnoses   Major Depressive Disorder, Recurrent, Severe, without Psychotic     Features  Suicidal Ideations  Assessment  Mario [name] is a 73 y.o. male admitted: Presented to the ED on 04/24/2023  6:00 PM for suicidal ideations. He carries the psychiatric diagnoses of Major Depressive Disorder, Recurrent, Severe, without Psychotic Features, anxiety, and PTSD and has a past medical history of COPD, chronic respiratory failure with hypoxia on home O2 therapy, DM2, OSA, HTN, BPH, history of lung cancer, and history of pulmonary embolism .    Mario [name], 73 y.o., male patient seen face to face by this provider, consulted with Dr. Enedina Finner; and chart reviewed on 04/28/23.  On evaluation Mario [name] patient continues to say that he is ready to go home, states he has no thoughts of harming himself.  Patient currently denies SI/HI/AVH. Patient continues to say that he will not kill himself because he has a daughter and 2 beautiful grandkids that look after him. Patient is pleasant upon approach and future oriented. He states that he sees his roommate Thayer Ohm daily, and they have "life talks" and Thayer Ohm helps him to feel better about himself, he also states that he is able to talk with his sister, daughter, and he has a "woman friend" who calls and prays with him daily.  On evaluation today, the patient is sitting up in his bed. He is  calm and cooperative during this assessment. His appearance is appropriate for environment. His eye contact is good.  Speech is clear and coherent, normal pace and normal volume, patient does seem to get short of breath at time, he is currently on oxygen and has on his nasal canula. He is alert and oriented x3 to person, place, and situation. He reports his mood as "feeling good today".   Affect is congruent with mood.  Thought process is coherent.  Thought content is within normal limits. He denies auditory and visual hallucinations.  No indication that he is responding to internal stimuli during this assessment.  No delusions elicited during this assessment.  He denies suicidal ideations. He denies homicidal ideations. Lives at home with room mates, no access to guns. Patient able to contract or safety. This provider spoke with Raiford Noble, staff at [facility] to get patient signed up for outpatient therapy sessions. Patient is in agreement.   Diagnoses:  Active Hospital problems: Principal Problem:   Major depressive disorder, recurrent episode (HCC) Active Problems:   Suicidal ideation    Plan   ## Psychiatric Medication Recommendations:  Continue patient home medications   ## Medical Decision Making Capacity: Not specifically addressed in this encounter  ## Further Work-up:  -- No further work up needed at this time  EKG or UDS -- most recent EKG on 04/24/23 had QtC of  457   ## Disposition:-- Patient is psychiatrically cleared. Patient case review and discussed with Dr. Enedina Finner, and patient does not meet inpatient criteria for inpatient psychiatric treatment. At time of discharge, patient denies SI, HI, AVH and can contract for safety. He demonstrated no overt evidence of psychosis or mania. Prior to discharge, he verbalized that they understood warning signs, triggers, and symptoms of worsening mental health and how to access emergency mental health care if they felt it was needed. Patient was  instructed to call 911 or return to the emergency room if they experienced any concerning symptoms after discharge. Patient voiced understanding and agreed to the above.  Patient given resources to follow up with behavioral health urgent care for therapy and medication management. Patient denies access to weapons. Safety planning completed.   Safety Plan Mario [name] will reach out to his pastor/ roommate Kris Mouton, call 911 or call mobile crisis, or go to nearest emergency room if condition worsens or if suicidal thoughts become active Patients' will follow up with [name] for outpatient psychiatric services (therapy/medication management).  The suicide prevention education provided includes the following: Suicide risk factors Suicide prevention and interventions National Suicide Hotline telephone number [facility] assessment telephone number [facility] Emergency Assistance 911 [facility] and/or Residential Mobile Crisis Unit telephone number Request made of family/significant other to:  pastor/ roommate Kris Mouton [employer] (e.g., guns, rifles, knives), all items previously/currently identified as safety concern.   Remove drugs/medications (over the counter, prescriptions, illicit drugs), all items previously/currently identified as a safety concern.      ## Behavioral / Environmental: -To minimize splitting of staff, assign one staff person to communicate all information from the team when feasible. or Utilize compassion and acknowledge the patient's experiences while setting clear and realistic expectations for care.    ## Safety and Observation Level:  - Based on my clinical evaluation, I estimate the patient to be at low risk of self harm in the current setting. - At this time, we recommend  routine. This decision is based on my review of the chart including patient's history and current presentation, interview of the patient, mental status  examination, and consideration of suicide risk including evaluating suicidal ideation, plan, intent, suicidal or self-harm behaviors, risk factors, and protective factors. This judgment is based on our ability to directly address suicide risk, implement suicide prevention strategies, and develop a safety plan while the patient is in the clinical setting. Please contact our team if there is a concern that risk level has changed.  CSSR Risk Category:C-SSRS RISK CATEGORY: High Risk  Suicide Risk Assessment: Patient has following modifiable risk factors for suicide: none, we will monitor patient overnight, and reassess in the morning for possible discharge. Patient has following non-modifiable or demographic risk factors for suicide: male gender, history of suicide attempt, and psychiatric hospitalization Patient has the following protective factors against suicide: Access to outpatient mental health care, Supportive family, and Supportive friends  Thank you for this consult request. Recommendations have been communicated to the primary team.  Patient is psychiatrically cleared at this time.   Alona Bene, PMHNP       History of Present Illness  Relevant Aspects of Hospital ED Course:  Admitted on 04/24/2023 for suicidal ideations.   Patient Report:   Mario [name], 73 y.o., male patient seen face to face by this provider, consulted with Dr. Enedina Finner; and chart reviewed on 04/28/23.  On evaluation Mario [name] patient continues to  say that he is ready to go home, states he has no thoughts of harming himself.  Patient currently denies SI/HI/AVH. Patient continues to say that he will not kill himself because he has a daughter and 2 beautiful grandkids that look after him. Patient is pleasant upon approach and future oriented. He states that he sees his roommate Thayer Ohm daily, and they have "life talks" and Thayer Ohm helps him to feel better about himself, he also states that he is able to talk  with his sister, daughter, and he has a "woman friend" who calls and prays with him daily.  On evaluation today, the patient is sitting up in his bed. He is calm and cooperative during this assessment. His appearance is appropriate for environment. His eye contact is good.  Speech is clear and coherent, normal pace and normal volume, patient does seem to get short of breath at time, he is currently on oxygen and has on his nasal canula. He is alert and oriented x3 to person, place, and situation. He reports his mood as "feeling good today".   Affect is congruent with mood.  Thought process is coherent.  Thought content is within normal limits. He denies auditory and visual hallucinations.  No indication that he is responding to internal stimuli during this assessment.  No delusions elicited during this assessment.  He denies suicidal ideations. He denies homicidal ideations. Lives at home with room mates, no access to guns. Patient able to contract or safety. This provider spoke with Raiford Noble, staff at [facility] to get patient signed up for outpatient therapy sessions. Patient is in agreement.    Psych ROS:  Depression: Denies Anxiety:  Denies Mania (lifetime and current): Denies Psychosis: (lifetime and current): Denies  Collateral information:  Spoke with patient roommate Kris Mouton and he stated that patient is not a danger to self or anyone else. Discussed with Thayer Ohm safety planning to have the patient return home today, as he does not present currently as an imminent risk to himself or others at this time.  Thayer Ohm reports that he has no issues with coming to pick patient up,  he will come pick her up upon discharge.   Review of Systems  Psychiatric/Behavioral: Negative.       Psychiatric and Social History  Psychiatric History:  Information collected from patient   Prev Dx/Sx: Depression, anxiety, PTSD Current Psych Provider: At the [facility] Meds (current): See above Previous Med  Trials: Yes Therapy: None  Prior Psych Hospitalization: Yes Prior Self Harm: Yes Prior Violence: Denies  Family Psych History: Denies Family Hx suicide: Denies  Social History:  Developmental Hx: Patient appears appropriate for age Educational Hx: Graduated high school Occupational Hx: Retired Armed forces operational officer Hx: Denies Living Situation: Lives with roommates Spiritual Hx: Christian Access to weapons/lethal means: Denies  Substance History Alcohol: Denies Type of alcohol none Last Drink unknown Number of drinks per day none History of alcohol withdrawal seizures denies History of DT's Denies Tobacco: Denies Illicit drugs: Denies Prescription drug abuse: Denies Rehab hx: Denies  Exam Findings  Physical Exam:  Vital Signs:  Temp:  [97.4 F (36.3 C)-98 F (36.7 C)] 97.4 F (36.3 C) (02/28 0634) Pulse Rate:  [91-126] 91 (02/28 0634) Resp:  [18] 18 (02/28 0634) BP: (129-146)/(70-87) 146/73 (02/28 0634) SpO2:  [84 %-98 %] 92 % (02/28 1104) Blood pressure (!) 146/73, pulse 91, temperature (!) 97.4 F (36.3 C), temperature source Oral, resp. rate 18, height 6\' 2"  (1.88 m), weight (!) 138 kg, SpO2  92%. Body mass index is 39.06 kg/m.  Physical Exam Vitals and nursing note reviewed.  Psychiatric:        Mood and Affect: Mood is euthymic and pleasant.        Behavior: Behavior normal.    Mental Status Exam: General Appearance: Casual  Orientation:  Full (Time, Place, and Person)  Memory:  Immediate;   Fair Remote;   Fair  Concentration:  Concentration: Fair and Attention Span: Fair  Recall:  Good  Attention  Good  Eye Contact:  Good  Speech:  Clear and Coherent  Language:  Good  Volume:  Normal  Mood: anxious, but appropriate  Affect:  Appropriate  Thought Process:  Coherent  Thought Content:  WDL  Suicidal Thoughts:  No  Homicidal Thoughts:  No  Judgement:  Fair  Insight:  Fair  Psychomotor Activity:  Normal  Akathisia:  No  Fund of Knowledge:  Fair       Assets:  Manufacturing systems engineer Desire for Improvement Housing Social Support  Cognition:  WNL  ADL's:  Intact  AIMS (if indicated):        Other History   These have been pulled in through the EMR, reviewed, and updated if appropriate.  Family History:  The patient's family history includes Dementia in his father.  Medical History: Past Medical History:  Diagnosis Date   Ankylosing spondylitis lumbar region [facility]) 09/25/2022   Anxiety    Bronchitis    COPD (chronic obstructive pulmonary disease) (HCC)    Depression    History of radiation therapy    Left lung- 10/05/20-10/15/20- Dr. Antony Blackbird   Hypertension    lung ca 09/2020   MI (myocardial infarction) [facility])    ????   On home oxygen therapy    4L/min [place]   OSA (obstructive sleep apnea)    Suicide attempt (HCC)    Tension pneumothorax 06/27/2016   Uveitis     Surgical History: Past Surgical History:  Procedure Laterality Date   BIOPSY  07/03/2021   Procedure: BIOPSY;  Surgeon: Kathi Der, MD;  Location: [facility] ENDOSCOPY;  Service: Gastroenterology;;   BRONCHIAL BIOPSY  07/30/2020   Procedure: BRONCHIAL BIOPSIES;  Surgeon: Josephine Igo, DO;  Location: [facility] ENDOSCOPY;  Service: Pulmonary;;   BRONCHIAL BRUSHINGS  07/30/2020   Procedure: BRONCHIAL BRUSHINGS;  Surgeon: Josephine Igo, DO;  Location: [facility] ENDOSCOPY;  Service: Pulmonary;;   BRONCHIAL NEEDLE ASPIRATION BIOPSY  07/30/2020   Procedure: BRONCHIAL NEEDLE ASPIRATION BIOPSIES;  Surgeon: Josephine Igo, DO;  Location: [facility] ENDOSCOPY;  Service: Pulmonary;;   BRONCHIAL WASHINGS  07/30/2020   Procedure: BRONCHIAL WASHINGS;  Surgeon: Josephine Igo, DO;  Location: [facility] ENDOSCOPY;  Service: Pulmonary;;   BRONCHIAL WASHINGS  04/17/2023   Procedure: BRONCHIAL WASHINGS;  Surgeon: Lorin Glass, MD;  Location: [facility] ENDOSCOPY;  Service: Pulmonary;;   CHEST TUBE INSERTION Left 06/27/2016   CRYOTHERAPY  04/17/2023   Procedure: CRYOTHERAPY;  Surgeon: Lorin Glass, MD;   Location: [facility] ENDOSCOPY;  Service: Pulmonary;;   cryptorchidism     ESOPHAGOGASTRODUODENOSCOPY N/A 07/03/2021   Procedure: ESOPHAGOGASTRODUODENOSCOPY (EGD);  Surgeon: Kathi Der, MD;  Location: [facility] ENDOSCOPY;  Service: Gastroenterology;  Laterality: N/A;   HEMOSTASIS CONTROL  04/17/2023   Procedure: HEMOSTASIS CONTROL;  Surgeon: Lorin Glass, MD;  Location: [facility] ENDOSCOPY;  Service: Pulmonary;;   IR PERC PLEURAL DRAIN W/INDWELL CATH W/IMG GUIDE  08/04/2021   IR REMOVAL OF PLURAL CATH W/CUFF  09/01/2021   SKIN CANCER EXCISION  VIDEO BRONCHOSCOPY Left 04/17/2023   Procedure: VIDEO BRONCHOSCOPY WITHOUT FLUORO;  Surgeon: Lorin Glass, MD;  Location: [facility] ENDOSCOPY;  Service: Pulmonary;  Laterality: Left;   VIDEO BRONCHOSCOPY WITH ENDOBRONCHIAL NAVIGATION Left 07/30/2020   Procedure: VIDEO BRONCHOSCOPY WITH ENDOBRONCHIAL NAVIGATION;  Surgeon: Josephine Igo, DO;  Location: [facility] ENDOSCOPY;  Service: Pulmonary;  Laterality: Left;     Medications:   Current Facility-Administered Medications:    acetaminophen (TYLENOL) tablet 1,000 mg, 1,000 mg, Oral, Q6H PRN, Glyn Ade, MD, 1,000 mg at 04/28/23 0955   albuterol (VENTOLIN HFA) 108 (90 Base) MCG/ACT inhaler 2 puff, 2 puff, Inhalation, Q4H PRN, Anders Simmonds T, DO   arformoterol (BROVANA) nebulizer solution 15 mcg, 15 mcg, Nebulization, BID, 15 mcg at 04/28/23 1103 **AND** umeclidinium bromide (INCRUSE ELLIPTA) 62.5 MCG/ACT 1 puff, 1 puff, Inhalation, Daily, Anders Simmonds T, DO, 1 puff at 04/27/23 0954   azelastine (ASTELIN) 0.1 % nasal spray 1 spray, 1 spray, Each Nare, BID, Anders Simmonds T, DO, 1 spray at 04/27/23 2017   bisacodyl (DULCOLAX) EC tablet 10 mg, 10 mg, Oral, Daily PRN, Anders Simmonds T, DO   buPROPion (WELLBUTRIN XL) 24 hr tablet 300 mg, 300 mg, Oral, q morning, Countryman, Chase, MD, 300 mg at 04/28/23 1004   busPIRone (BUSPAR) tablet 15 mg, 15 mg, Oral, q AM, Anders Simmonds T, DO, 15 mg at 04/28/23 1610    cholecalciferol (VITAMIN D3) 25 MCG (1000 UNIT) tablet 1,000 Units, 1,000 Units, Oral, Daily, Anders Simmonds T, DO, 1,000 Units at 04/28/23 1010   cyclobenzaprine (FLEXERIL) tablet 10 mg, 10 mg, Oral, QHS, Countryman, Chase, MD, 10 mg at 04/27/23 2015   diltiazem (CARDIZEM CD) 24 hr capsule 180 mg, 180 mg, Oral, Daily, Countryman, Chase, MD, 180 mg at 04/27/23 0957   docusate sodium (COLACE) capsule 200 mg, 200 mg, Oral, BID, Anders Simmonds T, DO, 200 mg at 04/28/23 9604   DULoxetine (CYMBALTA) DR capsule 60 mg, 60 mg, Oral, BID, Anders Simmonds T, DO, 60 mg at 04/28/23 5409   gabapentin (NEURONTIN) capsule 100 mg, 100 mg, Oral, TID, Glyn Ade, MD, 100 mg at 04/28/23 8119   hydrALAZINE (APRESOLINE) tablet 25 mg, 25 mg, Oral, BID, Countryman, Chase, MD, 25 mg at 04/28/23 0959   melatonin tablet 3 mg, 3 mg, Oral, QHS, Countryman, Chase, MD, 3 mg at 04/27/23 2013   metFORMIN (GLUCOPHAGE) tablet 850 mg, 850 mg, Oral, QPM, Anders Simmonds T, DO, 850 mg at 04/27/23 1813   multivitamin with minerals tablet 1 tablet, 1 tablet, Oral, Daily, Anders Simmonds T, DO, 1 tablet at 04/28/23 0959   OLANZapine (ZYPREXA) injection 10 mg, 10 mg, Intramuscular, Once PRN, Hunt, Katlin E, NP   Oxcarbazepine (TRILEPTAL) tablet 600 mg, 600 mg, Oral, TID, Anders Simmonds T, DO, 600 mg at 04/28/23 1478   oxyCODONE (Oxy IR/ROXICODONE) immediate release tablet 10 mg, 10 mg, Oral, BID PRN, Glyn Ade, MD, 10 mg at 04/27/23 1709   pantoprazole (PROTONIX) EC tablet 40 mg, 40 mg, Oral, Daily, Anders Simmonds T, DO, 40 mg at 04/28/23 2956   polyvinyl alcohol (LIQUIFILM TEARS) 1.4 % ophthalmic solution 1 drop, 1 drop, Both Eyes, QID PRN, Anders Simmonds T, DO   sodium chloride (OCEAN) 0.65 % nasal spray 1 spray, 1 spray, Each Nare, PRN, Anders Simmonds T, DO   tamsulosin (FLOMAX) capsule 0.8 mg, 0.8 mg, Oral, Daily, Countryman, Chase, MD, 0.8 mg at 04/27/23 0957   traZODone (DESYREL) tablet 75 mg, 75 mg, Oral, QHS,  Countryman, Chase, MD, 75 mg  at 04/27/23 2015  Current Outpatient Medications:    albuterol (PROVENTIL) (2.5 MG/3ML) 0.083% nebulizer solution, Take 2.5 mg by nebulization every 6 (six) hours as needed for wheezing or shortness of breath., Disp: , Rfl:    albuterol (VENTOLIN HFA) 108 (90 Base) MCG/ACT inhaler, Inhale 2 puffs into the lungs every 4 (four) hours as needed for wheezing or shortness of breath., Disp: , Rfl:    cyclobenzaprine (FLEXERIL) 10 MG tablet, Take 10 mg by mouth at bedtime., Disp: , Rfl:    gabapentin (NEURONTIN) 100 MG capsule, Take 100 mg by mouth 3 (three) times daily., Disp: , Rfl:    OXYGEN, Inhale 5 L/min into the lungs daily., Disp: , Rfl:    traZODone (DESYREL) 50 MG tablet, Take 75 mg by mouth at bedtime., Disp: , Rfl:    acetaminophen (TYLENOL) 500 MG tablet, Take 1,000 mg by mouth every 6 (six) hours as needed for mild pain (pain score 1-3) or moderate pain (pain score 4-6). (Patient not taking: Reported on 04/26/2023), Disp: , Rfl:    azelastine (ASTELIN) 0.1 % nasal spray, Place 1 spray into both nostrils 2 (two) times daily. Use in each nostril as directed (Patient not taking: Reported on 04/26/2023), Disp: , Rfl:    bisacodyl (DULCOLAX) 5 MG EC tablet, Take 2 tablets (10 mg total) by mouth daily as needed for moderate constipation. (Patient not taking: Reported on 04/26/2023), Disp: 30 tablet, Rfl: 0   buPROPion (WELLBUTRIN XL) 300 MG 24 hr tablet, Take 300 mg by mouth every morning. (Patient not taking: Reported on 04/26/2023), Disp: , Rfl:    busPIRone (BUSPAR) 15 MG tablet, Take 15 mg by mouth in the morning. (Patient not taking: Reported on 04/26/2023), Disp: , Rfl:    Calcium Carbonate (CALCIUM 500 PO), Take 1 tablet by mouth daily. (Patient not taking: Reported on 04/26/2023), Disp: , Rfl:    Carboxymethylcellulose Sod PF 0.5 % SOLN, Place 1 drop into both eyes 4 (four) times daily as needed (dry eyes). (Patient not taking: Reported on 04/26/2023), Disp: , Rfl:     cholecalciferol (VITAMIN D3) 25 MCG (1000 UNIT) tablet, Take 1,000 Units by mouth daily. (Patient not taking: Reported on 04/26/2023), Disp: , Rfl:    cycloSPORINE (RESTASIS) 0.05 % ophthalmic emulsion, Place 1 drop into both eyes every 12 (twelve) hours. (Patient not taking: Reported on 04/26/2023), Disp: , Rfl:    diltiazem (CARDIZEM CD) 180 MG 24 hr capsule, Take 180 mg by mouth in the morning. (Patient not taking: Reported on 04/26/2023), Disp: , Rfl:    docusate sodium (COLACE) 100 MG capsule, Take 200 mg by mouth 2 (two) times daily. (Patient not taking: Reported on 04/26/2023), Disp: , Rfl:    DULoxetine (CYMBALTA) 60 MG capsule, Take 60 mg by mouth 2 (two) times daily. (Patient not taking: Reported on 04/26/2023), Disp: , Rfl:    hydrALAZINE (APRESOLINE) 25 MG tablet, Take 1 tablet (25 mg total) by mouth 2 (two) times daily. (Patient not taking: Reported on 04/26/2023), Disp: 90 tablet, Rfl: 2   LACTULOSE PO, Take 2 Scoops by mouth 2 (two) times daily as needed. 2 tablespoonfuls (Patient not taking: Reported on 04/26/2023), Disp: , Rfl:    Melatonin 3 MG CAPS, Take 3 mg by mouth at bedtime. (Patient not taking: Reported on 04/26/2023), Disp: , Rfl:    metFORMIN (GLUCOPHAGE) 850 MG tablet, Take 850 mg by mouth every evening. (Patient not taking: Reported on 04/26/2023), Disp: , Rfl:    Nutritional Supplements (ENSURE ORIGINAL)  LIQD, Take 1 Bottle by mouth daily as needed (in between meals). (Patient not taking: Reported on 04/26/2023), Disp: , Rfl:    omeprazole (PRILOSEC) 20 MG capsule, Take 1 capsule (20 mg total) by mouth daily. (Patient not taking: Reported on 04/26/2023), Disp: 30 capsule, Rfl: 0   Oxcarbazepine (TRILEPTAL) 300 MG tablet, Take 600 mg by mouth in the morning, at noon, and at bedtime. (Patient not taking: Reported on 04/26/2023), Disp: , Rfl:    Oxycodone HCl 10 MG TABS, Take 1 tablet (10 mg total) by mouth 2 (two) times daily as needed for moderate pain (pain score 4-6) or severe pain  (pain score 7-10). (Patient not taking: Reported on 04/26/2023), Disp: 30 tablet, Rfl: 0   psyllium (HYDROCIL/METAMUCIL) 95 % PACK, Take 1 packet by mouth daily. (Patient not taking: Reported on 04/26/2023), Disp: 30 packet, Rfl: 0   sodium chloride (OCEAN) 0.65 % SOLN nasal spray, Place 1 spray into both nostrils daily at 2 am. (Patient not taking: Reported on 04/26/2023), Disp: , Rfl:    tamsulosin (FLOMAX) 0.4 MG CAPS capsule, Take 0.4 mg by mouth daily. (Patient not taking: Reported on 04/26/2023), Disp: , Rfl:    Tiotropium Bromide-Olodaterol 2.5-2.5 MCG/ACT AERS, Inhale 2 each into the lungs every morning. 2 puffs (Patient not taking: Reported on 04/26/2023), Disp: , Rfl:   Allergies: Allergies  Allergen Reactions   Demerol [Meperidine] Nausea And Vomiting and Other (See Comments)    Made the patient "violently sick"   Zocor [Simvastatin] Nausea And Vomiting and Other (See Comments)    Made him very jittery, also   Beet [Beta Vulgaris] Nausea And Vomiting   Liver Nausea And Vomiting    Any Liver    Mario [name], PMHNP

## 2023-04-28 NOTE — BH Assessment (Addendum)
 Disposition Social Work Placement Note:  Mario [name] Date: 04/26/2023 Time: 12:10 PM   Psych cleared per Alona Bene, NP. Patient referred pt to a therapist at the [facility] clinic. Info will go in pts AVS.

## 2023-04-28 NOTE — ED Notes (Signed)
 [facility] called pts roommate for additional information and to see if he feels pt is safe to return home. [facility] left a HIPAA compliant message to return the call.   Jacquelynn Cree, [facility]   04/28/23

## 2023-04-28 NOTE — ED Provider Notes (Addendum)
 Patient is psychiatrically cleared for discharge with outpatient follow-up after contract for safety with remade to pick him up.  I reviewed the patient's medical chart.  He had admitted to the hospital and discharged on the 24th and treated for 7 days for pulmonary infiltrate or pneumonia.  He is noted to have ambulatory tachycardia and chronic hypoxia at baseline on 4 to 5 L of oxygen.  He has oxygen at home.  He follows with the [facility]. he was treated for acute COPD and bronchitis with steroids and antibiotics in the hospital that time.  He subsequently returned to the hospital within a day with extensive and myriad complaints, having declined skilled nursing home placement on prior discharge, and now conditionally suicidal.  I reviewed his workup and I suspect that the patient's shortness of breath is chronic and likely multifactorial but I have a lower suspicion for acute pneumonia.  X-ray does show patchiness of the lower lobes, which was consistent with his very recently diagnosed and treated pneumonia.  But he does not appear to have sepsis.  He will need to continue with his breathing treatments at home and [facility]    He will either be discharged with his roommate this afternoon or else PTAR if portable oxygen tank is not available.  IVC rescinded.   Terald Sleeper, MD 04/28/23 1153    Terald Sleeper, MD 04/28/23 [phone]

## 2023-04-28 NOTE — Hospital Course (Addendum)
 73 year old male with advanced COPD with known history of adenocarcinoma of the lungs (reconfirmed on 2/17 via bronchoscopy), chronic hypoxic respiratory failure on 4 to 5 L of oxygen at home, history of PE now off anticoagulation due to hemoptysis and epistaxis.  Presenting [facility] with shortness of breath thought to be suffering from COPD exacerbation with right lower lobe pneumonia.    Of note, patient has had multiple recent hospitalizations.  Patient was admitted from 1/17 until 2/1 and then was readmitted the same day on 2/1 and remained hospitalized till 2/24.  Patient then returned to [facility] once again on the same day and was hospitalized from 2/24 until 2/28.  Once again he returned on the same day of discharge with reports of hypoxia at home.  Upon patient, patient has been managed with intravenous antibiotics and bronchodilator therapy.  Patient's COPD with exacerbation was felt to likely also be playing a role and therefore patient was also given systemic steroids.  Over the course the hospitalization the patient was provided with 5 days of intravenous ceftriaxone and azithromycin.  Despite aggressive treatment, patient experienced very little overall improvement in his symptoms and after extensive evaluation it was felt that the majority of the patient's symptoms were likely gradual progression of the patient's multifactorial pulmonary disease.    Patient was felt to be suffering from progressive advanced COPD with superimposed lung malignancy.  Goals of care discussions were had with the patient with assistance of our palliative care team.  Patient was advised of his deteriorating prognosis and need for frequent hospitalizations as a result.  Patient declined hospice services and wished to remain full code.    A good portion of the patient's persisting shortness of breath was likely secondary to longstanding clinical deterioration likely due to a combination of  advanced COPD and lung malignancy.After several days of treatment patient was felt to clinically be at his new baseline.  Arranges were made for the patient to be discharged home with an extended steroid taper.    Arrangements were additionally made for the patient to receive home health physical therapy and aide.  Patient was advised to follow closely with both his primary care provider and his oncologist at the [facility] for follow-up and determination of next steps particularly surrounding his malignancy.  Patient was discharged home in stable condition 05/05/2023.

## 2023-04-29 ENCOUNTER — Encounter (HOSPITAL_COMMUNITY): Payer: Self-pay | Admitting: Internal Medicine

## 2023-04-29 DIAGNOSIS — J189 Pneumonia, unspecified organism: Secondary | ICD-10-CM

## 2023-04-29 DIAGNOSIS — J432 Centrilobular emphysema: Secondary | ICD-10-CM | POA: Diagnosis not present

## 2023-04-29 LAB — CBC
HCT: 39.5 % (ref 39.0–52.0)
Hemoglobin: 12.8 g/dL — ABNORMAL LOW (ref 13.0–17.0)
MCH: 29.4 pg (ref 26.0–34.0)
MCHC: 32.4 g/dL (ref 30.0–36.0)
MCV: 90.8 fL (ref 80.0–100.0)
Platelets: 367 10*3/uL (ref 150–400)
RBC: 4.35 MIL/uL (ref 4.22–5.81)
RDW: 13.2 % (ref 11.5–15.5)
WBC: 9.4 10*3/uL (ref 4.0–10.5)
nRBC: 0 % (ref 0.0–0.2)

## 2023-04-29 LAB — BASIC METABOLIC PANEL
Anion gap: 9 (ref 5–15)
BUN: 15 mg/dL (ref 8–23)
CO2: 27 mmol/L (ref 22–32)
Calcium: 8.9 mg/dL (ref 8.9–10.3)
Chloride: 101 mmol/L (ref 98–111)
Creatinine, Ser: 0.85 mg/dL (ref 0.61–1.24)
GFR, Estimated: >60 mL/min (ref >60)
Glucose, Bld: 132 mg/dL — ABNORMAL HIGH (ref 70–99)
Potassium: 3.9 mmol/L (ref 3.5–5.1)
Sodium: 137 mmol/L (ref 135–145)

## 2023-04-29 LAB — GLUCOSE, CAPILLARY
Glucose-Capillary: 79 mg/dL (ref 70–99)
Glucose-Capillary: 191 mg/dL — ABNORMAL HIGH (ref 70–99)

## 2023-04-29 LAB — CBG MONITORING, ED
Glucose-Capillary: 146 mg/dL — ABNORMAL HIGH (ref 70–99)
Glucose-Capillary: 174 mg/dL — ABNORMAL HIGH (ref 70–99)

## 2023-04-29 LAB — STREP PNEUMONIAE URINARY ANTIGEN: Strep Pneumo Urinary Antigen: NEGATIVE

## 2023-04-29 MED ORDER — KETOROLAC TROMETHAMINE 15 MG/ML IJ SOLN
15.0000 mg | Freq: Once | INTRAMUSCULAR | Status: AC
  Administered 2023-04-29: 15 mg via INTRAVENOUS
  Filled 2023-04-29: qty 1

## 2023-04-29 NOTE — ED Provider Notes (Signed)
 Patient signed out to me by previous provider. Please refer to their note for full HPI.  Briefly this is a 73 year old male with chronic oxygen requirement who presented to the emergency department with hypoxia and shortness of breath.  Noted to have oxygen in the 60s prior to arrival, as low as 88% here on his baseline.  Workup is significant for hypoxia in the setting of worsening pneumonia noted on CT scan.  Antibiotics have been ordered and we will plan for medical admission.  Patients evaluation and results requires admission for further treatment and care.  Spoke with hospitalist, reviewed patient's ED course and they accept admission.  Patient agrees with admission plan, offers no new complaints and is stable/unchanged at time of admit.   Rozelle Logan, DO 04/29/23 0040

## 2023-04-29 NOTE — Progress Notes (Signed)
 PROGRESS NOTE  Mario [name] ZOX:[id number] DOB: 08/30/1950 DOA: 04/28/2023 PCP: [name]   LOS: 0 days   Brief Narrative / Interim history: 73 year old male with advanced COPD with chronic hypoxic respiratory failure on 4 to 5 L of oxygen at home, history of PE now off anticoagulation due to hemoptysis and epistaxis.  He has had several back-to-back hospitalizations this year, he comes in for shortness of breath, is treated for COPD exacerbation, but upon return home he seeks care within hours/few days.  He recently had an RSV infection earlier this year and has been struggling since.  Most recent hospitalizations reviewed, was in the hospital 1/17-2/1 stage, readmitted the same day in 2/1-2/24, developed hemoptysis and epistaxis while on Eliquis, and he was taken off anticoagulation.  Underwent bronchoscopy 2/17, BAL and biopsy consistent with adenocarcinoma.  He was discharged home 2/24, returned the same day with suicidal ideation, psychiatry saw him, he was cleared for discharge on 2/28, return home but by the end of the day he decided to come back to the hospital as he saw his sats go into the 60s.  Subjective / 24h Interval events: He is feeling well at rest, sitting at the edge of bed on my evaluation.  He tells me that he felt okay upon going home, but has flight of stairs in his home that he is having difficulties navigating.  He must go upstairs and he cannot stay at the main level, since that where his room is.  Assesement and Plan: Principal Problem:   Right lower lobe pneumonia Active Problems:   Adjustment disorder with mixed disturbance of emotions and conduct   Acute on chronic hypoxic respiratory failure (HCC)   DM2 (diabetes mellitus, type 2) (HCC)   COPD (chronic obstructive pulmonary disease) (HCC)   Non-small cell cancer of 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   Essential hypertension   history of Pulmonary embolism (HCC)   BPH (benign prostatic  hyperplasia)   OSA (obstructive sleep apnea)   Anxiety  Principal problem Acute on chronic hypoxic respiratory failure, advanced COPD, left lung adenocarcinoma -patient with multiple hospitalizations in the last 2 months, it seems like he is unable to function at all at home and is seeking readmission within 1 to 2 days following discharge for significant dyspnea, especially upon going up a flight of stairs in his home.  He thinks he will need to change living situation -For now continue antibiotics, nebulizers and supportive care for his respiratory status.  Active problems History of PE-most recent CT angiogram without clots.  Off anticoagulation due to recent hemoptysis and epistaxis  DM2-continue sliding scale  Depression/anxiety-seen by psychiatry recently for suicidal ideation, cleared.  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  I wonder whether, at this point, his lung disease is so advanced and also given his malignancy that the process is not reversible.  Will continue discussions over the next few days, will need palliative care consultation likely  Scheduled Meds:  arformoterol  15 mcg Nebulization BID   And   umeclidinium bromide  1 puff Inhalation Daily   buPROPion  300 mg Oral q morning   busPIRone  15 mg Oral Daily  cycloSPORINE  1 drop Both Eyes Q12H   diltiazem  180 mg Oral Daily   DULoxetine  60 mg Oral BID   gabapentin  100 mg Oral TID   guaiFENesin  600 mg Oral BID   hydrALAZINE  25 mg Oral BID   insulin aspart  0-9 Units Subcutaneous TID WC   melatonin  6 mg Oral QHS   Oxcarbazepine  600 mg Oral TID   sodium chloride flush  3 mL Intravenous Q12H   tamsulosin  0.8 mg Oral Daily    traZODone  75 mg Oral QHS   Continuous Infusions:  azithromycin     cefTRIAXone (ROCEPHIN)  IV 2 g (04/29/23 0942)   PRN Meds:.acetaminophen **OR** acetaminophen, albuterol, bisacodyl, cyclobenzaprine, HYDROcodone-acetaminophen, ondansetron **OR** ondansetron (ZOFRAN) IV, senna-docusate  Current Outpatient Medications  Medication Instructions   acetaminophen (TYLENOL) 1,000 mg, Every 6 hours PRN   albuterol (PROVENTIL) 2.5 mg, Nebulization, Every 6 hours PRN   albuterol (VENTOLIN HFA) 108 (90 Base) MCG/ACT inhaler 2 puffs, Inhalation, Every 4 hours PRN   azelastine (ASTELIN) 0.1 % nasal spray 1 spray, Each Nare, 2 times daily PRN, Use in each nostril as directed   bisacodyl (DULCOLAX) 10 mg, Oral, Daily PRN   buPROPion (WELLBUTRIN XL) 300 mg, Oral, Every morning   busPIRone (BUSPAR) 15 mg, Oral, Every morning   Carboxymethylcellulose Sod PF 0.5 % SOLN 1 drop, 4 times daily PRN   cholecalciferol (VITAMIN D3) 1,000 Units, Daily   cyclobenzaprine (FLEXERIL) 10 mg, At bedtime PRN   cycloSPORINE (RESTASIS) 0.05 % ophthalmic emulsion 1 drop, Every 12 hours   diltiazem (CARDIZEM CD) 180 mg, Oral, Every morning   docusate sodium (COLACE) 200 mg, 2 times daily   DULoxetine (CYMBALTA) 60 mg, Oral, 2 times daily   gabapentin (NEURONTIN) 100 mg, Oral, 3 times daily   hydrALAZINE (APRESOLINE) 25 mg, Oral, 2 times daily   HYDROcodone-acetaminophen (NORCO) 10-325 MG tablet 1 tablet, Every 6 hours PRN   LACTULOSE PO 2 Scoops, 2 times daily PRN   Melatonin 6 mg, Daily at bedtime   metFORMIN (GLUCOPHAGE) 850 mg, Every evening   omeprazole (PRILOSEC) 20 mg, Oral, Daily   Oxcarbazepine (TRILEPTAL) 600 mg, 3 times daily   Oxycodone HCl 10 mg, Oral, 2 times daily PRN   OXYGEN 5 L/min, Inhalation, Every 24 hours   psyllium (HYDROCIL/METAMUCIL) 95 % PACK 1 packet, Oral, Daily   sodium chloride (OCEAN) 0.65 % SOLN nasal spray 1 spray, Each Nare, Daily   tamsulosin (FLOMAX) 0.8 mg, Oral, Daily    Tiotropium Bromide-Olodaterol 2.5-2.5 MCG/ACT AERS 2 each, Every morning   traZODone (DESYREL) 75 mg, Daily at bedtime    Diet Orders (From admission, onward)     Start     Ordered   04/28/23 2257  Diet heart healthy/carb modified Room service appropriate? Yes; Fluid consistency: Thin  Diet effective now       Question Answer Comment  Diet-HS Snack? Nothing   Room service appropriate? Yes   Fluid consistency: Thin      04/28/23 2257            DVT prophylaxis: SCDs Start: 04/28/23 2256   Lab Results  Component Value Date   PLT 367 04/29/2023      Code Status: Full Code  Family Communication: No family at bedside  Status is: Observation The patient will require care spanning > 2 midnights and should be moved to inpatient because: Severity of illness   Level of care: Med-Surg  Consultants:  None  Objective: Vitals:   04/28/23 2245 04/29/23 0024 04/29/23 0200 04/29/23 0715  BP: (!) 142/86  (!) 159/94 (!) 143/88  Pulse: (!) 102  90 96  Resp: 18  16 20   Temp:  98.5 F (36.9 C)  98.5 F (36.9 C)  TempSrc:      SpO2: 94%  92% 95%  Weight:      Height:       No intake or output data in the 24 hours ending 04/29/23 0947 Wt Readings from Last 3 Encounters:  04/28/23 (!) 138 kg  04/24/23 (!) 138 kg  04/18/23 (!) 138 kg    Examination:  Constitutional: NAD Eyes: no scleral icterus ENMT: Mucous membranes are moist.  Neck: normal, supple Respiratory: Coarse breath sounds bilaterally, no wheezing heard.  Normal respiratory effort Cardiovascular: Regular rate and rhythm, no murmurs / rubs / gallops. No LE edema.  Abdomen: non distended, no tenderness. Bowel sounds positive.  Musculoskeletal: no clubbing / cyanosis.    Data Reviewed: I have independently reviewed following labs and imaging studies   CBC Recent Labs  Lab 04/23/23 1247 04/24/23 1850 04/28/23 1840 04/29/23 0700  WBC 10.6* 11.1* 10.7* 9.4  HGB 12.6* 12.6* 12.7* 12.8*  HCT 40.4 37.5*  39.7 39.5  PLT 335 353 379 367  MCV 94.4 91.0 91.3 90.8  MCH 29.4 30.6 29.2 29.4  MCHC 31.2 33.6 32.0 32.4  RDW 13.1 13.1 13.2 13.2  LYMPHSABS  --   --  1.6  --   MONOABS  --   --  1.1*  --   EOSABS  --   --  0.2  --   BASOSABS  --   --  0.1  --     Recent Labs  Lab 04/23/23 1247 04/24/23 1850 04/28/23 1840 04/29/23 0700  NA 136 131* 135 137  K 4.5 4.2 4.0 3.9  CL 97* 95* 100 101  CO2 29 27 24 27   GLUCOSE 137* 220* 135* 132*  BUN 12 13 14 15   CREATININE 0.48* 0.78 0.75 0.85  CALCIUM 8.7* 8.5* 8.8* 8.9  AST 16 20 18   --   ALT 14 16 17   --   ALKPHOS 70 79 82  --   BILITOT 0.4 0.3 0.5  --   ALBUMIN 3.2* 3.3* 3.5  --   BNP  --   --  45.4  --     ------------------------------------------------------------------------------------------------------------------ No results for input(s): "CHOL", "HDL", "LDLCALC", "TRIG", "CHOLHDL", "LDLDIRECT" in the last 72 hours.  Lab Results  Component Value Date   HGBA1C 6.7 (H) 03/18/2023   ------------------------------------------------------------------------------------------------------------------ No results for input(s): "TSH", "T4TOTAL", "T3FREE", "THYROIDAB" in the last 72 hours.  Invalid input(s): "FREET3"  Cardiac Enzymes No results for input(s): "CKMB", "TROPONINI", "MYOGLOBIN" in the last 168 hours.  Invalid input(s): "CK" ------------------------------------------------------------------------------------------------------------------    Component Value Date/Time   BNP 45.4 04/28/2023 1840    CBG: Recent Labs  Lab 04/23/23 1134 04/23/23 1702 04/23/23 2116 04/24/23 0740 04/29/23 0751  GLUCAP 157* 140* 137* 104* 146*    Recent Results (from the past 240 hours)  SARS Coronavirus 2 by RT PCR (hospital order, performed in [facility] hospital lab) *cepheid single result test* Anterior Nasal Swab     Status: None   Collection Time: 04/27/23  2:07 PM   Specimen: Anterior Nasal Swab  Result Value Ref Range  Status   SARS Coronavirus 2 by RT PCR NEGATIVE NEGATIVE Final    Comment: (NOTE) SARS-CoV-2 target nucleic acids are NOT DETECTED.  The SARS-CoV-2 RNA is generally detectable in upper and lower respiratory specimens during the acute phase of infection. The lowest concentration of SARS-CoV-2 viral copies this assay can detect is 250 copies / mL. A negative result does not preclude SARS-CoV-2 infection and should not be used as the sole basis for treatment or other patient management decisions.  A negative result may occur with improper specimen collection / handling, submission of specimen other than nasopharyngeal swab, presence of viral mutation(s) within the areas targeted by this assay, and inadequate number of viral copies (<250 copies / mL). A negative result must be combined with clinical observations, patient history, and epidemiological information.  Fact Sheet for Patients:   RoadLapTop.co.za  Fact Sheet for Healthcare Providers: http://kim-miller.com/  This test is not yet approved or  cleared by the [place] FDA and has been authorized for detection and/or diagnosis of SARS-CoV-2 by FDA under an Emergency Use Authorization (EUA).  This EUA will remain in effect (meaning this test can be used) for the duration of the COVID-19 declaration under Section 564(b)(1) of the Act, 21 U.S.C. section 360bbb-3(b)(1), unless the authorization is terminated or revoked sooner.  Performed at [facility], [address] [place], [place] [address]   Resp panel by RT-PCR (RSV, Flu A&B, Covid) Anterior Nasal Swab     Status: None   Collection Time: 04/28/23  9:41 PM   Specimen: Anterior Nasal Swab  Result Value Ref Range Status   SARS Coronavirus 2 by RT PCR NEGATIVE NEGATIVE Final    Comment: (NOTE) SARS-CoV-2 target nucleic acids are NOT DETECTED.  The SARS-CoV-2 RNA is generally detectable in upper  respiratory specimens during the acute phase of infection. The lowest concentration of SARS-CoV-2 viral copies this assay can detect is 138 copies/mL. A negative result does not preclude SARS-Cov-2 infection and should not be used as the sole basis for treatment or other patient management decisions. A negative result may occur with  improper specimen collection/handling, submission of specimen other than nasopharyngeal swab, presence of viral mutation(s) within the areas targeted by this assay, and inadequate number of viral copies(<138 copies/mL). A negative result must be combined with clinical observations, patient history, and epidemiological information. The expected result is Negative.  Fact Sheet for Patients:  BloggerCourse.com  Fact Sheet for Healthcare Providers:  SeriousBroker.it  This test is no t yet approved or cleared by the [place] FDA and  has been authorized for detection and/or diagnosis of SARS-CoV-2 by FDA under an Emergency Use Authorization (EUA). This EUA will remain  in effect (meaning this test can be used) for the duration of the COVID-19 declaration under Section 564(b)(1) of the Act, 21 U.S.C.section 360bbb-3(b)(1), unless the authorization is terminated  or revoked sooner.       Influenza A by PCR NEGATIVE NEGATIVE Final   Influenza B by PCR NEGATIVE NEGATIVE Final    Comment: (NOTE) The Xpert Xpress SARS-CoV-2/FLU/RSV plus assay is intended as an aid in the diagnosis of influenza from Nasopharyngeal swab specimens and should not be used as a sole basis for treatment. Nasal washings and aspirates are unacceptable for Xpert Xpress SARS-CoV-2/FLU/RSV testing.  Fact Sheet for Patients: BloggerCourse.com  Fact Sheet for Healthcare Providers: SeriousBroker.it  This test is not yet approved or cleared by the [place] FDA and has been  authorized for detection and/or diagnosis of SARS-CoV-2 by FDA under an Emergency Use Authorization (EUA). This EUA will remain in effect (meaning this test can be used) for the  duration of the COVID-19 declaration under Section 564(b)(1) of the Act, 21 U.S.C. section 360bbb-3(b)(1), unless the authorization is terminated or revoked.     Resp Syncytial Virus by PCR NEGATIVE NEGATIVE Final    Comment: (NOTE) Fact Sheet for Patients: BloggerCourse.com  Fact Sheet for Healthcare Providers: SeriousBroker.it  This test is not yet approved or cleared by the [place] FDA and has been authorized for detection and/or diagnosis of SARS-CoV-2 by FDA under an Emergency Use Authorization (EUA). This EUA will remain in effect (meaning this test can be used) for the duration of the COVID-19 declaration under Section 564(b)(1) of the Act, 21 U.S.C. section 360bbb-3(b)(1), unless the authorization is terminated or revoked.  Performed at [facility], [address], [place], [place] [address]      Radiology Studies: CT Angio Chest PE W and/or Wo Contrast Result Date: 04/28/2023 CLINICAL DATA:  PE suspected. Shortness of breath with exertion. Headache. Low oxygen saturations. EXAM: CT ANGIOGRAPHY CHEST WITH CONTRAST TECHNIQUE: Multidetector CT imaging of the chest was performed using the standard protocol during bolus administration of intravenous contrast. Multiplanar CT image reconstructions and MIPs were obtained to evaluate the vascular anatomy. RADIATION DOSE REDUCTION: This exam was performed according to the departmental dose-optimization program which includes automated exposure control, adjustment of the mA and/or kV according to patient size and/or use of iterative reconstruction technique. CONTRAST:  75mL OMNIPAQUE IOHEXOL 350 MG/ML SOLN COMPARISON:  Same day chest radiograph and CT chest 04/06/2023 FINDINGS:  Cardiovascular: Negative for acute pulmonary embolism. Coronary artery and aortic atherosclerotic calcification. No pericardial effusion. Mediastinum/Nodes: Trachea is patent. There is layering debris in the left mainstem bronchus. Unremarkable esophagus. No thoracic adenopathy. Lungs/Pleura: Centrilobular and paraseptal emphysema greatest in the upper lobes. Consolidative and ground-glass opacities in the lingula and left lower lobe are similar to prior. Increased consolidation and ground-glass opacities in the right lower lobe compared with 04/06/2023. Increased small right pleural effusion and similar small left pleural effusion. Bronchial wall thickening greatest in the lower lungs. No pneumothorax. Upper Abdomen: No acute abnormality. Musculoskeletal: No acute fracture. Chronic left lateral seventh and eighth rib fractures. Thoracic spondylosis with fusion of anterior osteophytes. Review of the MIP images confirms the above findings. IMPRESSION: 1. Negative for acute pulmonary embolism. 2. Increased consolidation and ground-glass opacities in the right lower lobe compared with 04/06/2023. Findings favor worsening pneumonia. 3. Similar consolidative and ground-glass opacities in the lingula and left lower lobe. Differential considerations include pneumonia, radiation fibrosis, or malignancy given history of lung cancer. 4. Increased small right pleural effusion and similar small left pleural effusion. 5. Debris in the left mainstem bronchus.  Query aspiration. Electronically Signed   By: Minerva Fester M.D.   On: 04/28/2023 21:22   DG Chest Portable 1 View Result Date: 04/28/2023 CLINICAL DATA:  Shortness of breath.  Headache. EXAM: PORTABLE CHEST 1 VIEW COMPARISON:  Chest radiograph dated 04/24/2023 and CT dated 04/06/2023 FINDINGS: Background of emphysema and chronic interstitial coarsening. Bibasilar airspace densities similar to prior radiograph and CT. There is a small right pleural effusion. No  pneumothorax. Stable cardiac silhouette no acute osseous pathology. IMPRESSION: No interval change since the prior radiograph. Electronically Signed   By: Elgie Collard M.D.   On: 04/28/2023 18:36     Pamella Pert, MD, PhD [facility]  Between 7 am - 7 pm I am available, please contact me via Amion (for emergencies) or Securechat (non urgent messages)  Between 7 pm - 7 am I am not  available, please contact night coverage MD/APP via Amion

## 2023-04-29 NOTE — Plan of Care (Signed)
  Problem: Nutritional: Goal: Maintenance of adequate nutrition will improve Outcome: Progressing Goal: Progress toward achieving an optimal weight will improve Outcome: Progressing   Problem: Tissue Perfusion: Goal: Adequacy of tissue perfusion will improve Outcome: Progressing

## 2023-04-29 NOTE — IPAL (Addendum)
  Interdisciplinary Goals of Care Family Meeting   Date carried out: 04/29/2023  Location of the meeting: Bedside  Member's involved: Physician and Other: Patient  Durable Power of Attorney or acting medical decision maker: Patient    Discussion: We discussed goals of care for [employer].  Long discussion with the patient at bedside this afternoon, regarding the fact that, realistically, has not spent more than just a few hours/days outside the hospital since mid January.  He has difficulties at home as he has a flight of stairs to navigate between his bedroom and kitchen, and also he is being exposed to secondhand smoke.  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  He is requiring 5 to 6 L nasal cannula and seems to have failed repeated attempts at discharge into the community.  He understands the gravity of the situation and reports that he was DNR at one point in the past but not currently.  He tells me that he is sure he does not want to be on a breathing machine as he understands it is unlikely that he will ever come off.  We discussed aspects of resuscitative efforts that along with chest compressions, airways have to be addressed in order to increase the rate of success and he understands.  He is thinking about it and for now would like to remain full code.  I did explain to him that my recommendation is that he is DNR moving forward.  We also briefly discussed about palliative and hospice concepts as well.  I do recommend palliative care consultation to have this discussions in more depth and he is in agreement.  I will also contact the daughter who currently lives in [place], he tells me that she probably is unaware of how advanced his lung disease is.    Attempted to call the daughter, Bethena Midget at both numbers listed, no answer.  Voicemail did not appear to be personalized and did not leave a message.  Will attempt later  Code  status:   Code Status: Full Code   Disposition: Continue current acute care  Time spent for the meeting: 25 minutes    Pamella Pert, MD  04/29/2023, 2:49 PM

## 2023-04-30 DIAGNOSIS — Z515 Encounter for palliative care: Secondary | ICD-10-CM | POA: Diagnosis not present

## 2023-04-30 DIAGNOSIS — Z7189 Other specified counseling: Secondary | ICD-10-CM | POA: Diagnosis not present

## 2023-04-30 DIAGNOSIS — J189 Pneumonia, unspecified organism: Secondary | ICD-10-CM | POA: Diagnosis not present

## 2023-04-30 LAB — COMPREHENSIVE METABOLIC PANEL
ALT: 16 U/L (ref 0–44)
AST: 17 U/L (ref 15–41)
Albumin: 3 g/dL — ABNORMAL LOW (ref 3.5–5.0)
Alkaline Phosphatase: 74 U/L (ref 38–126)
Anion gap: 9 (ref 5–15)
BUN: 20 mg/dL (ref 8–23)
CO2: 27 mmol/L (ref 22–32)
Calcium: 8.5 mg/dL — ABNORMAL LOW (ref 8.9–10.3)
Chloride: 100 mmol/L (ref 98–111)
Creatinine, Ser: 1 mg/dL (ref 0.61–1.24)
GFR, Estimated: >60 mL/min (ref >60)
Glucose, Bld: 131 mg/dL — ABNORMAL HIGH (ref 70–99)
Potassium: 4 mmol/L (ref 3.5–5.1)
Sodium: 136 mmol/L (ref 135–145)
Total Bilirubin: 0.5 mg/dL (ref 0.0–1.2)
Total Protein: 6.4 g/dL — ABNORMAL LOW (ref 6.5–8.1)

## 2023-04-30 LAB — CBC
HCT: 37.5 % — ABNORMAL LOW (ref 39.0–52.0)
Hemoglobin: 11.7 g/dL — ABNORMAL LOW (ref 13.0–17.0)
MCH: 29.2 pg (ref 26.0–34.0)
MCHC: 31.2 g/dL (ref 30.0–36.0)
MCV: 93.5 fL (ref 80.0–100.0)
Platelets: 337 10*3/uL (ref 150–400)
RBC: 4.01 MIL/uL — ABNORMAL LOW (ref 4.22–5.81)
RDW: 13.1 % (ref 11.5–15.5)
WBC: 8.6 10*3/uL (ref 4.0–10.5)
nRBC: 0 % (ref 0.0–0.2)

## 2023-04-30 LAB — GLUCOSE, CAPILLARY
Glucose-Capillary: 112 mg/dL — ABNORMAL HIGH (ref 70–99)
Glucose-Capillary: 99 mg/dL (ref 70–99)
Glucose-Capillary: 142 mg/dL — ABNORMAL HIGH (ref 70–99)
Glucose-Capillary: 133 mg/dL — ABNORMAL HIGH (ref 70–99)

## 2023-04-30 LAB — MAGNESIUM: Magnesium: 2.1 mg/dL (ref 1.7–2.4)

## 2023-04-30 MED ORDER — SUMATRIPTAN SUCCINATE 25 MG PO TABS
25.0000 mg | ORAL_TABLET | ORAL | Status: DC | PRN
  Administered 2023-04-30 – 2023-05-01 (×2): 25 mg via ORAL
  Filled 2023-04-30 (×3): qty 1

## 2023-04-30 NOTE — Plan of Care (Signed)
   Problem: Fluid Volume: Goal: Ability to maintain a balanced intake and output will improve Outcome: Progressing

## 2023-04-30 NOTE — Progress Notes (Signed)
   04/30/23 0016  BiPAP/CPAP/SIPAP  Reason BIPAP/CPAP not in use Non-compliant (patient does not want to wear one at the hospital)

## 2023-04-30 NOTE — TOC Initial Note (Addendum)
 Transition of Care [facility] - Initial/Assessment Note    Patient Details  Name: Mario [name] MRN: [id number] Date of Birth: [birth year]  Transition of Care [facility]) CM/SW Contact:    Adrian Prows, RN Phone Number: 04/30/2023, 3:52 PM  Clinical Narrative:                 TOC for d/c planning; spoke w/ pt in room; pt says he lives at home w/ his roommate; he plans to return at d/c; he identified POC [employer] (dtr) [phone]; pt says he will try to arrange transportation; he verified Insurance/PCP; he denies SDOH risks; he has HHPT w/ [facility], HH aide from [employer], and home oxygen w/ [facility]; pt says he has a travel tank in his hospital room; Laurelyn Sickle at [facility] notified; [facility] will follow.  -[id number]- notified by Laurelyn Sickle at [facility] that pt was not admitted by agency on 04/26/23.  Expected Discharge Plan: Home w Home Health Services Barriers to Discharge: Continued Medical Work up   Patient Goals and CMS Choice Patient states their goals for this hospitalization and ongoing recovery are:: home CMS Medicare.gov Compare Post Acute Care list provided to:: Patient        Expected Discharge Plan and Services   Discharge Planning Services: CM Consult   Living arrangements for the past 2 months: Single Family Home                                      Prior Living Arrangements/Services Living arrangements for the past 2 months: Single Family Home Lives with:: Roommate Patient language and need for interpreter reviewed:: Yes Do you feel safe going back to the place where you live?: Yes      Need for Family Participation in Patient Care: Yes (Comment) Care giver support system in place?: Yes (comment) Current home services: DME (home oxygen) Criminal Activity/Legal Involvement Pertinent to Current Situation/Hospitalization: No - Comment as needed  Activities of Daily Living   ADL Screening (condition at time of admission) Independently performs  ADLs?: Yes (appropriate for developmental age) Is the patient deaf or have difficulty hearing?: No Does the patient have difficulty seeing, even when wearing glasses/contacts?: No Does the patient have difficulty concentrating, remembering, or making decisions?: No  Permission Sought/Granted Permission sought to share information with : Case Manager Permission granted to share information with : Yes, Verbal Permission Granted  Share Information with NAME: Case Manager     Permission granted to share info w Relationship: Bethena Midget (dtr) [phone]     Emotional Assessment Appearance:: Appears stated age Attitude/Demeanor/Rapport: Gracious Affect (typically observed): Accepting Orientation: : Oriented to Self, Oriented to Place, Oriented to  Time, Oriented to Situation Alcohol / Substance Use: Not Applicable Psych Involvement: No (comment)  Admission diagnosis:  Right lower lobe pneumonia [J18.9] Acute on chronic hypoxic respiratory failure (HCC) [J96.21] Patient Active Problem List   Diagnosis Date Noted   Right lower lobe pneumonia 04/28/2023   Epistaxis 04/19/2023   Hemoptysis 04/06/2023   COPD exacerbation (HCC) 04/04/2023   COPD with acute bronchitis (HCC) 04/01/2023   RSV bronchitis 03/22/2023   Community acquired pneumonia 03/17/2023   History of lung cancer 03/17/2023   Acute hypoxic respiratory failure (HCC) 03/17/2023   RSV (respiratory syncytial virus pneumonia) 03/17/2023   Ankylosing spondylitis lumbar region (HCC) 09/25/2022   Vasculitis (HCC) 07/02/2022   Chronic pain 07/02/2022   history of Pulmonary embolism (HCC)  07/02/2022   Anxiety    Acute on chronic hypoxic respiratory failure (HCC) 11/02/2021   Adjustment disorder with depressed mood 10/09/2021   BPH (benign prostatic hyperplasia) 09/23/2021   Fracture of ribs, two, left, sequela 08/24/2021   Metastatic carcinoma (HCC) 08/07/2021   Non-small cell lung cancer, left (HCC) 08/07/2021   Chronic  respiratory failure with hypoxia, on home O2 therapy (HCC) - 4 L/min 08/07/2021   Rib pain 08/03/2021   History of falling 07/29/2021   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 [place] [address] Phone: [phone] Fax: [phone]  [facility] [address] [place] [address] Phone: [phone] Fax: [phone]     Social Drivers of Health (SDOH) Social History: SDOH Screenings   Food Insecurity: No Food Insecurity (04/30/2023)  Housing: Low Risk  (04/30/2023)  Transportation Needs: No  Transportation Needs (04/30/2023)  Utilities: Not At Risk (04/30/2023)  Financial Resource Strain: Low Risk  (09/23/2020)  Social Connections: Socially Isolated (04/29/2023)  Stress: No Stress Concern Present (09/23/2020)  Tobacco Use: Medium Risk (04/24/2023)   SDOH Interventions: Food Insecurity Interventions: Intervention Not Indicated, Inpatient TOC Housing Interventions: Intervention Not Indicated, Inpatient TOC Transportation Interventions: Intervention Not Indicated, Inpatient TOC Utilities Interventions: Intervention Not Indicated, Inpatient TOC   Readmission Risk Interventions    04/24/2023   10:09 AM 03/25/2023    2:23 PM 11/03/2021   11:50 AM  Readmission Risk Prevention Plan  Transportation Screening Complete Complete Complete  Medication Review Oceanographer) Complete Complete Complete  PCP or Specialist appointment within 3-5 days of discharge   Complete  HRI or Home Care Consult Complete Complete Complete  SW Recovery Care/Counseling Consult Complete Complete Complete  Palliative Care Screening Not Applicable Not Applicable Not Applicable  Skilled Nursing Facility Not Applicable Not Applicable Not Applicable

## 2023-04-30 NOTE — Care Management Obs Status (Signed)
 MEDICARE OBSERVATION STATUS NOTIFICATION   Patient Details  Name: Mario [name] MRN: [id number] Date of Birth: 1951/01/01   Medicare Observation Status Notification Given:  Yes    Adrian Prows, RN 04/30/2023, 3:46 PM

## 2023-04-30 NOTE — Progress Notes (Signed)
 Mobility Specialist - Progress Note   04/30/23 1105  Oxygen Therapy  SpO2 (!) 79 %  O2 Device Nasal Cannula  O2 Flow Rate (L/min) 6 L/min  Patient Activity (if Appropriate) Ambulating  Mobility  Activity Ambulated independently in hallway  Level of Assistance Independent  Assistive Device None  Distance Ambulated (ft) 80 ft  Activity Response Tolerated well  Mobility Referral Yes  Mobility visit 1 Mobility  Mobility Specialist Start Time (ACUTE ONLY) 1054  Mobility Specialist Stop Time (ACUTE ONLY) 1104  Mobility Specialist Time Calculation (min) (ACUTE ONLY) 10 min   Pt received EOB and agreeable to mobility. Upon returning to room, pt desat to 79%. Encouraged seated pursed lip breaths bringing SpO2 to 91%. Pt SOB throughout session. Pt to EOB after session with all needs met. MD in room.    Pre-mobility: 92% SpO2 (6L [place]) During mobility: 79-91% SpO2 (6L [place]) Post-mobility: 91% SPO2 (6L [place])  Chief Technology Officer

## 2023-04-30 NOTE — Consult Note (Signed)
 Palliative Medicine Inpatient Consult Note  Consulting Provider: Leatha Gilding, MD   Reason for consult:   Palliative Care Consult Services Palliative Medicine Consult  Reason for Consult? GOC   04/30/2023  HPI:  Per intake H&P --> 73 year old male with advanced COPD with chronic hypoxic respiratory failure on 4 to 5 L of oxygen at home, history of PE now off anticoagulation due to hemoptysis and epistaxis. The Palliative care team has been asked to get involved to address goals of care given recurrent hospitalizations.   Clinical Assessment/Goals of Care:  *Please note that this is a verbal dictation therefore any spelling or grammatical errors are due to the "Dragon Medical One" system interpretation.  I have reviewed medical records including EPIC notes, labs and imaging, received report from bedside RN, assessed the patient who is resting comfortably sitting at the edge of the bed.    I met with Mario [name] to further discuss diagnosis prognosis, GOC, EOL wishes, disposition and options.   I introduced Palliative Medicine as specialized medical care for people living with serious illness. It focuses on providing relief from the symptoms and stress of a serious illness. The goal is to improve quality of life for both the patient and the family.  Medical History Review and Understanding:  A review of Mario [name]'s past medical history significant for PE on Eliquis, history of left lung cancer (s/p radiation), T2DM, HTN, BPH, GAD COPD requiring 4 to 5 L of oxygen at baseline, lung cancer, obstructive sleep apnea, and anxiety/depression was completed.  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  He was born in [place] though has lived in various states and countries.  He shares that he worked as a Restaurant manager, fast food for period of time and more recently he had a job at [employer] and thereafter at [employer] [place] in Orth[employer].  He has been married twice and has 1  daughter who lives in [place] as well as 2 grandchildren ages 71 and 22-1/2 years old.  He shares that he enjoys life and music.  He notes that he used a "party" a lot in his youth.  Functional and Nutritional State:  [name] presently lives on the second floor of a single-family home with multiple roommates.  He shares that he does not use a walker for mobility however does note significant shortness of breath when mobilizing up stairs or long distances.  This inhibits him from being able to perform these activities.  He has a very good appetite this is not diminished per self-report.  Palliative Symptoms:  Dyspnea on exertion.  Advance Directives:  A detailed discussion was had today regarding advanced directives.  I shared with Mario [name] the importance of creating advanced directives to guide his daughter if he ever gets in a position where he is unable to make decisions for himself.  I shared the importance of having this made as it describes circumstances where he would not want life-prolonging measures indefinitely.  A blank copy was provided to Mario [name] to review and complete.  Code Status:  Concepts specific to code status, artifical feeding and hydration, continued IV antibiotics and rehospitalization was had.  The difference between a aggressive medical intervention path  and a palliative comfort care path for this patient at this time was had.   Encouraged patient/family to consider DNR/DNI status understanding evidenced based poor outcomes in similar hospitalized patient, as the cause of arrest is likely associated with advanced chronic/terminal illness rather than an easily  reversible acute cardio-pulmonary event. I explained that DNR/DNI does not change the medical plan and it only comes into effect after a person has arrested (died).  It is a protective measure to keep [place] from harming the patient in their last moments of life.   I shared in the setting of Mario [name]'s lung disease he will  most certainly require long-term ventilation.  Mario [name] vocalizes that he would not want to be on a ventilator indefinitely for the rest of his life.  He would want an attempt made at resuscitating him though should he get to a point where that is necessary.  Provided  "Hard Choices for [employer]" booklet.   Discussion:  Mario [name] and I had an open and honest conversation regarding his recurrent rehospitalization's.  He notes that his first hospitalization was in the setting of RSV infection and then he was readmitted in a short period of time thereafter.  He shares now that he is being treated for pneumonia.  I shared with Mario [name] my concern that we are clearly at the end stage of his disease as indicated by his recurrent rehospitalization's, his poor activity tolerance without symptoms.   I broached the topic of hospice for consideration.  Mario [name] shares in the past he had met with a liaison from hospice though he was told he could no longer get every 45-month images for his lung adenocarcinoma.  He shares that he still wants to get imaging to see if this is improving or if it is worsening.  I asked Mario [name] what gives him motivation to get up in the morning.  He shares his daughter who does speak with regularly and her 2 grandchildren.  He notes that he has not been able to see them as a result of his debility from his disease.  He does appreciate being in communication with his daughter and getting an idea of how things are going for her in her life.  As far as [place] living situation he is working on getting apartments on the first level though apparently these have long wait list from what he understands and has researched.  I plan to speak to the transitions of care team regarding this matter.  At this point Meng would like to allow time for outcomes.  Discussed the importance of continued conversation with family and their  medical providers regarding overall plan of care and treatment  options, ensuring decisions are within the context of the patients values and GOCs. __________________________ Addendum:  Patients daughter, Mario [name] was called to provide an update. A HIPAA compliant VM was left.   Decision Maker: Bethena Midget (Daughter): [phone]  SUMMARY OF RECOMMENDATIONS   Full code/full scope of care --> Hard choices booklet provided  Encourage patient to complete advanced directives during hospitalization --> will ask chaplain support to stop by to review documents  Open and honest conversations held in the setting of patient's severe end-stage COPD --> he presently is not at a point whereby he would like to pursue hospice care as this would no longer enable him to get serial scans to identify as his cancer is progressing  Appreciate TOC helping with long term housing options on first floor --> (+) VA benefits but only 30% service connected  Ongoing palliative care support  Code Status/Advance Care Planning: FULL CODE   Symptom Management:  Dyspnea on Exertion: Consider adding Morphine 2.5mg  PO Q3H PRN SOB  Palliative Prophylaxis:  Aspiration, Bowel Regimen, Delirium Protocol, Frequent Pain Assessment, Oral Care, Palliative Wound Care,  and Turn Reposition  Additional Recommendations (Limitations, Scope, Preferences): Continue current care  Psycho-social/Spiritual:  Desire for further Chaplaincy support: Yes Additional Recommendations: Education on end stage COPD   Prognosis: Limited overall - hospice would be a reasonable consideration  Discharge Planning: To be determined  Vitals:   04/29/23 2122 04/30/23 0629  BP:  (!) 154/80  Pulse:  100  Resp:  18  Temp:  98.5 F (36.9 C)  SpO2: 93% 92%    Intake/Output Summary (Last 24 hours) at 04/30/2023 8413 Last data filed at 04/30/2023 0600 Gross per 24 hour  Intake 1060 ml  Output 1600 ml  Net -540 ml   Last Weight  Most recent update: 04/30/2023  5:50 AM    Weight  137 kg (302 lb 0.5 oz)               Gen:  Elderly Caucasian M chronically ill-appearing HEENT: moist mucous membranes CV: Irregular rate and irregular rhythm PULM: On 4 L nasal cannula breathing is even and not labored able to complete sentences ABD: soft/nontender EXT: No edema Neuro: Alert and oriented x3  PPS: 50%   This conversation/these recommendations were discussed with patient primary care team, Dr. Elvera Lennox (page sent to inform of consult completion)  Billing based on MDM: High Time: 46 ______________________________________________________ Lamarr Lulas [facility]arr[facility] Palliative Medicine Team Team Cell Phone: [phone] Please utilize secure chat with additional questions, if there is no response within 30 minutes please call the above phone number  Palliative Medicine Team providers are available by phone from 7am to 7pm daily and can be reached through the team cell phone.  Should this patient require assistance outside of these hours, please call the patient's attending physician.

## 2023-04-30 NOTE — Progress Notes (Addendum)
 Chaplain responded to [facility] consult for assistance with AD paperwork, Pt was alert and willing to visit. Pt shared with this chaplain that his next of kin, his daughter, Yvonne Kendall is his Environmental health practitioner. Pt has AD paperwork and is aware of how to reach chaplain to follow up to complete AD. Additional support available upon request.  Chaplain Daria Pastures Div   04/30/23 1300  Spiritual Encounters  Type of Visit Initial  Care provided to: Patient  Conversation partners present during encounter Nurse  Referral source Physician  Reason for visit Advance directives  Spiritual Framework  Presenting Themes Goals in life/care  Patient Stress Factors Health changes  Interventions  Spiritual Care Interventions Made Established relationship of care and support;Compassionate presence  Intervention Outcomes  Outcomes Connection to spiritual care;Awareness of support  Spiritual Care Plan  Spiritual Care Issues Still Outstanding No further spiritual care needs at this time (see row info)

## 2023-04-30 NOTE — Progress Notes (Signed)
 Patient refusing CPAP, states he does not like our masks.

## 2023-04-30 NOTE — Progress Notes (Signed)
 PROGRESS NOTE  MATHAN DARROCH ZOX:[id number] DOB: May 04, 1950 DOA: 04/28/2023 PCP: [name]   LOS: 0 days   Brief Narrative / Interim history: 73 year old male with advanced COPD with chronic hypoxic respiratory failure on 4 to 5 L of oxygen at home, history of PE now off anticoagulation due to hemoptysis and epistaxis.  He has had several back-to-back hospitalizations this year, he comes in for shortness of breath, is treated for COPD exacerbation, but upon return home he seeks care within hours/few days.  He recently had an RSV infection earlier this year and has been struggling since.  Most recent hospitalizations reviewed, was in the hospital 1/17-2/1 stage, readmitted the same day in 2/1-2/24, developed hemoptysis and epistaxis while on Eliquis, and he was taken off anticoagulation.  Underwent bronchoscopy 2/17, BAL and biopsy consistent with adenocarcinoma.  He was discharged home 2/24, returned the same day with suicidal ideation, psychiatry saw him, he was cleared for discharge on 2/28, return home but by the end of the day he decided to come back to the hospital as he saw his sats go into the 60s.  Subjective / 24h Interval events: Just finished walking about 40 feet, significant difficulties in catching his breath, sats in the upper 70s but slowly recovering  Assesement and Plan: Principal Problem:   Right lower lobe pneumonia Active Problems:   Adjustment disorder with mixed disturbance of emotions and conduct   Acute on chronic hypoxic respiratory failure (HCC)   DM2 (diabetes mellitus, type 2) (HCC)   COPD (chronic obstructive pulmonary disease) (HCC)   Non-small cell cancer of 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   Essential hypertension   history of Pulmonary embolism (HCC)   BPH (benign prostatic hyperplasia)   OSA (obstructive sleep apnea)   Anxiety  Principal problem Acute on chronic hypoxic respiratory failure, advanced COPD, left lung  adenocarcinoma -patient with multiple hospitalizations in the last 2 months, it seems like he is unable to function at all at home and is seeking readmission within 1 to 2 days following discharge for significant dyspnea, especially upon going up a flight of stairs in his home.  He thinks he will need to change living situation, [facility] consult tomorrow -For now continue antibiotics, nebulizers and supportive care for his respiratory status.  Active problems History of PE-most recent CT angiogram without clots.  Off anticoagulation due to recent hemoptysis and epistaxis  DM2-continue sliding scale  Depression/anxiety-seen by psychiatry recently for suicidal ideation, cleared.  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  Goals of care-palliative consulted, remains full code/full scope of care  Scheduled Meds:  arformoterol  15 mcg Nebulization BID   And   umeclidinium bromide  1 puff Inhalation Daily   buPROPion  300 mg Oral q morning   busPIRone  15 mg Oral Daily   cycloSPORINE  1 drop Both Eyes Q12H   diltiazem  180 mg Oral Daily   DULoxetine  60 mg Oral BID   gabapentin  100 mg Oral TID   guaiFENesin  600 mg Oral BID   hydrALAZINE  25 mg Oral BID   insulin aspart  0-9 Units Subcutaneous TID WC   melatonin  6 mg Oral QHS   Oxcarbazepine  600 mg Oral TID   sodium chloride flush  3 mL Intravenous Q12H   tamsulosin  0.8 mg Oral Daily   traZODone  75 mg Oral QHS   Continuous Infusions:  azithromycin 500 mg (04/29/23 2019)  cefTRIAXone (ROCEPHIN)  IV 2 g (04/30/23 0938)   PRN Meds:.acetaminophen **OR** acetaminophen, albuterol, bisacodyl, cyclobenzaprine, HYDROcodone-acetaminophen, ondansetron **OR** ondansetron (ZOFRAN) IV, senna-docusate  Current Outpatient Medications  Medication Instructions   acetaminophen (TYLENOL) 1,000 mg, Every 6 hours PRN   albuterol (PROVENTIL) 2.5 mg, Nebulization, Every 6 hours PRN   albuterol (VENTOLIN HFA) 108 (90 Base)  MCG/ACT inhaler 2 puffs, Inhalation, Every 4 hours PRN   azelastine (ASTELIN) 0.1 % nasal spray 1 spray, Each Nare, 2 times daily PRN, Use in each nostril as directed   bisacodyl (DULCOLAX) 10 mg, Oral, Daily PRN   buPROPion (WELLBUTRIN XL) 300 mg, Oral, Every morning   busPIRone (BUSPAR) 15 mg, Oral, Every morning   Carboxymethylcellulose Sod PF 0.5 % SOLN 1 drop, 4 times daily PRN   cholecalciferol (VITAMIN D3) 1,000 Units, Daily   cyclobenzaprine (FLEXERIL) 10 mg, At bedtime PRN   cycloSPORINE (RESTASIS) 0.05 % ophthalmic emulsion 1 drop, Every 12 hours   diltiazem (CARDIZEM CD) 180 mg, Oral, Every morning   docusate sodium (COLACE) 200 mg, 2 times daily   DULoxetine (CYMBALTA) 60 mg, Oral, 2 times daily   gabapentin (NEURONTIN) 100 mg, Oral, 3 times daily   hydrALAZINE (APRESOLINE) 25 mg, Oral, 2 times daily   HYDROcodone-acetaminophen (NORCO) 10-325 MG tablet 1 tablet, Every 6 hours PRN   LACTULOSE PO 2 Scoops, 2 times daily PRN   Melatonin 6 mg, Daily at bedtime   metFORMIN (GLUCOPHAGE) 850 mg, Every evening   omeprazole (PRILOSEC) 20 mg, Oral, Daily   Oxcarbazepine (TRILEPTAL) 600 mg, 3 times daily   Oxycodone HCl 10 mg, Oral, 2 times daily PRN   OXYGEN 5 L/min, Inhalation, Every 24 hours   psyllium (HYDROCIL/METAMUCIL) 95 % PACK 1 packet, Oral, Daily   sodium chloride (OCEAN) 0.65 % SOLN nasal spray 1 spray, Each Nare, Daily   tamsulosin (FLOMAX) 0.8 mg, Oral, Daily   Tiotropium Bromide-Olodaterol 2.5-2.5 MCG/ACT AERS 2 each, Every morning   traZODone (DESYREL) 75 mg, Daily at bedtime    Diet Orders (From admission, onward)     Start     Ordered   04/29/23 1707  Diet regular Room service appropriate? Yes; Fluid consistency: Thin  Diet effective now       Question Answer Comment  Room service appropriate? Yes   Fluid consistency: Thin      04/29/23 1706            DVT prophylaxis: SCDs Start: 04/28/23 2256   Lab Results  Component Value Date   PLT 337  04/30/2023      Code Status: Full Code  Family Communication: No family at bedside  Status is: Observation The patient will require care spanning > 2 midnights and should be moved to inpatient because: Severity of illness   Level of care: Med-Surg  Consultants:  None  Objective: Vitals:   04/30/23 0550 04/30/23 0629 04/30/23 1051 04/30/23 1105  BP:  (!) 154/80    Pulse:  100    Resp:  18    Temp:  98.5 F (36.9 C)    TempSrc:  Oral    SpO2:  92% 94% (!) 79%  Weight: (!) 137 kg     Height:        Intake/Output Summary (Last 24 hours) at 04/30/2023 1344 Last data filed at 04/30/2023 1031 Gross per 24 hour  Intake 1982.78 ml  Output 2350 ml  Net -367.22 ml   Wt Readings from Last 3 Encounters:  04/30/23 (!) 137 kg  04/24/23 (!) 138 kg  04/18/23 (!) 138 kg    Examination:  Constitutional: NAD Eyes: lids and conjunctivae normal, no scleral icterus ENMT: mmm Neck: normal, supple Respiratory: clear to auscultation bilaterally, no wheezing, no crackles. Cardiovascular: Regular rate and rhythm, no murmurs / rubs / gallops. No LE edema. Abdomen: soft, no distention, no tenderness. Bowel sounds positive.   Data Reviewed: I have independently reviewed following labs and imaging studies   CBC Recent Labs  Lab 04/24/23 1850 04/28/23 1840 04/29/23 0700 04/30/23 0506  WBC 11.1* 10.7* 9.4 8.6  HGB 12.6* 12.7* 12.8* 11.7*  HCT 37.5* 39.7 39.5 37.5*  PLT 353 379 367 337  MCV 91.0 91.3 90.8 93.5  MCH 30.6 29.2 29.4 29.2  MCHC 33.6 32.0 32.4 31.2  RDW 13.1 13.2 13.2 13.1  LYMPHSABS  --  1.6  --   --   MONOABS  --  1.1*  --   --   EOSABS  --  0.2  --   --   BASOSABS  --  0.1  --   --     Recent Labs  Lab 04/24/23 1850 04/28/23 1840 04/29/23 0700 04/30/23 0506  NA 131* 135 137 136  K 4.2 4.0 3.9 4.0  CL 95* 100 101 100  CO2 27 24 27 27   GLUCOSE 220* 135* 132* 131*  BUN 13 14 15 20   CREATININE 0.78 0.75 0.85 1.00  CALCIUM 8.5* 8.8* 8.9 8.5*  AST 20 18  --   17  ALT 16 17  --  16  ALKPHOS 79 82  --  74  BILITOT 0.3 0.5  --  0.5  ALBUMIN 3.3* 3.5  --  3.0*  MG  --   --   --  2.1  BNP  --  45.4  --   --     ------------------------------------------------------------------------------------------------------------------ No results for input(s): "CHOL", "HDL", "LDLCALC", "TRIG", "CHOLHDL", "LDLDIRECT" in the last 72 hours.  Lab Results  Component Value Date   HGBA1C 6.7 (H) 03/18/2023   ------------------------------------------------------------------------------------------------------------------ No results for input(s): "TSH", "T4TOTAL", "T3FREE", "THYROIDAB" in the last 72 hours.  Invalid input(s): "FREET3"  Cardiac Enzymes No results for input(s): "CKMB", "TROPONINI", "MYOGLOBIN" in the last 168 hours.  Invalid input(s): "CK" ------------------------------------------------------------------------------------------------------------------    Component Value Date/Time   BNP 45.4 04/28/2023 1840    CBG: Recent Labs  Lab 04/29/23 1121 04/29/23 1650 04/29/23 2118 04/30/23 0715 04/30/23 1144  GLUCAP 174* 79 191* 112* 99    Recent Results (from the past 240 hours)  SARS Coronavirus 2 by RT PCR (hospital order, performed in [facility] hospital lab) *cepheid single result test* Anterior Nasal Swab     Status: None   Collection Time: 04/27/23  2:07 PM   Specimen: Anterior Nasal Swab  Result Value Ref Range Status   SARS Coronavirus 2 by RT PCR NEGATIVE NEGATIVE Final    Comment: (NOTE) SARS-CoV-2 target nucleic acids are NOT DETECTED.  The SARS-CoV-2 RNA is generally detectable in upper and lower respiratory specimens during the acute phase of infection. The lowest concentration of SARS-CoV-2 viral copies this assay can detect is 250 copies / mL. A negative result does not preclude SARS-CoV-2 infection and should not be used as the sole basis for treatment or other patient management decisions.  A negative result may  occur with improper specimen collection / handling, submission of specimen other than nasopharyngeal swab, presence of viral mutation(s) within the areas targeted by this assay, and inadequate number of viral copies (<250  copies / mL). A negative result must be combined with clinical observations, patient history, and epidemiological information.  Fact Sheet for Patients:   RoadLapTop.co.za  Fact Sheet for Healthcare Providers: http://kim-miller.com/  This test is not yet approved or  cleared by the [place] FDA and has been authorized for detection and/or diagnosis of SARS-CoV-2 by FDA under an Emergency Use Authorization (EUA).  This EUA will remain in effect (meaning this test can be used) for the duration of the COVID-19 declaration under Section 564(b)(1) of the Act, 21 U.S.C. section 360bbb-3(b)(1), unless the authorization is terminated or revoked sooner.  Performed at [facility], [address] [place], [place] [address]   Resp panel by RT-PCR (RSV, Flu A&B, Covid) Anterior Nasal Swab     Status: None   Collection Time: 04/28/23  9:41 PM   Specimen: Anterior Nasal Swab  Result Value Ref Range Status   SARS Coronavirus 2 by RT PCR NEGATIVE NEGATIVE Final    Comment: (NOTE) SARS-CoV-2 target nucleic acids are NOT DETECTED.  The SARS-CoV-2 RNA is generally detectable in upper respiratory specimens during the acute phase of infection. The lowest concentration of SARS-CoV-2 viral copies this assay can detect is 138 copies/mL. A negative result does not preclude SARS-Cov-2 infection and should not be used as the sole basis for treatment or other patient management decisions. A negative result may occur with  improper specimen collection/handling, submission of specimen other than nasopharyngeal swab, presence of viral mutation(s) within the areas targeted by this assay, and inadequate number of  viral copies(<138 copies/mL). A negative result must be combined with clinical observations, patient history, and epidemiological information. The expected result is Negative.  Fact Sheet for Patients:  BloggerCourse.com  Fact Sheet for Healthcare Providers:  SeriousBroker.it  This test is no t yet approved or cleared by the [place] FDA and  has been authorized for detection and/or diagnosis of SARS-CoV-2 by FDA under an Emergency Use Authorization (EUA). This EUA will remain  in effect (meaning this test can be used) for the duration of the COVID-19 declaration under Section 564(b)(1) of the Act, 21 U.S.C.section 360bbb-3(b)(1), unless the authorization is terminated  or revoked sooner.       Influenza A by PCR NEGATIVE NEGATIVE Final   Influenza B by PCR NEGATIVE NEGATIVE Final    Comment: (NOTE) The Xpert Xpress SARS-CoV-2/FLU/RSV plus assay is intended as an aid in the diagnosis of influenza from Nasopharyngeal swab specimens and should not be used as a sole basis for treatment. Nasal washings and aspirates are unacceptable for Xpert Xpress SARS-CoV-2/FLU/RSV testing.  Fact Sheet for Patients: BloggerCourse.com  Fact Sheet for Healthcare Providers: SeriousBroker.it  This test is not yet approved or cleared by the [place] FDA and has been authorized for detection and/or diagnosis of SARS-CoV-2 by FDA under an Emergency Use Authorization (EUA). This EUA will remain in effect (meaning this test can be used) for the duration of the COVID-19 declaration under Section 564(b)(1) of the Act, 21 U.S.C. section 360bbb-3(b)(1), unless the authorization is terminated or revoked.     Resp Syncytial Virus by PCR NEGATIVE NEGATIVE Final    Comment: (NOTE) Fact Sheet for Patients: BloggerCourse.com  Fact Sheet for Healthcare  Providers: SeriousBroker.it  This test is not yet approved or cleared by the [place] FDA and has been authorized for detection and/or diagnosis of SARS-CoV-2 by FDA under an Emergency Use Authorization (EUA). This EUA will remain in effect (meaning this test can be used)  for the duration of the COVID-19 declaration under Section 564(b)(1) of the Act, 21 U.S.C. section 360bbb-3(b)(1), unless the authorization is terminated or revoked.  Performed at [facility], [address] [place], [place] [address]      Radiology Studies: No results found.    Pamella Pert, MD, PhD [facility]  Between 7 am - 7 pm I am available, please contact me via Amion (for emergencies) or Securechat (non urgent messages)  Between 7 pm - 7 am I am not available, please contact night coverage MD/APP via Amion

## 2023-05-01 DIAGNOSIS — E119 Type 2 diabetes mellitus without complications: Secondary | ICD-10-CM | POA: Diagnosis present

## 2023-05-01 DIAGNOSIS — F419 Anxiety disorder, unspecified: Secondary | ICD-10-CM | POA: Diagnosis present

## 2023-05-01 DIAGNOSIS — R0600 Dyspnea, unspecified: Secondary | ICD-10-CM | POA: Diagnosis not present

## 2023-05-01 DIAGNOSIS — J189 Pneumonia, unspecified organism: Secondary | ICD-10-CM | POA: Diagnosis present

## 2023-05-01 DIAGNOSIS — C3490 Malignant neoplasm of unspecified part of unspecified bronchus or lung: Secondary | ICD-10-CM | POA: Diagnosis not present

## 2023-05-01 DIAGNOSIS — Z7189 Other specified counseling: Secondary | ICD-10-CM | POA: Diagnosis not present

## 2023-05-01 DIAGNOSIS — F4325 Adjustment disorder with mixed disturbance of emotions and conduct: Secondary | ICD-10-CM | POA: Diagnosis present

## 2023-05-01 DIAGNOSIS — Z1152 Encounter for screening for COVID-19: Secondary | ICD-10-CM | POA: Diagnosis not present

## 2023-05-01 DIAGNOSIS — C3492 Malignant neoplasm of unspecified part of left bronchus or lung: Secondary | ICD-10-CM | POA: Diagnosis present

## 2023-05-01 DIAGNOSIS — Z66 Do not resuscitate: Secondary | ICD-10-CM | POA: Diagnosis present

## 2023-05-01 DIAGNOSIS — G4733 Obstructive sleep apnea (adult) (pediatric): Secondary | ICD-10-CM | POA: Diagnosis present

## 2023-05-01 DIAGNOSIS — Z6839 Body mass index (BMI) 39.0-39.9, adult: Secondary | ICD-10-CM | POA: Diagnosis not present

## 2023-05-01 DIAGNOSIS — Z9981 Dependence on supplemental oxygen: Secondary | ICD-10-CM | POA: Diagnosis not present

## 2023-05-01 DIAGNOSIS — E66812 Obesity, class 2: Secondary | ICD-10-CM | POA: Diagnosis present

## 2023-05-01 DIAGNOSIS — Z85828 Personal history of other malignant neoplasm of skin: Secondary | ICD-10-CM | POA: Diagnosis not present

## 2023-05-01 DIAGNOSIS — I252 Old myocardial infarction: Secondary | ICD-10-CM | POA: Diagnosis not present

## 2023-05-01 DIAGNOSIS — J441 Chronic obstructive pulmonary disease with (acute) exacerbation: Secondary | ICD-10-CM | POA: Diagnosis present

## 2023-05-01 DIAGNOSIS — Z7984 Long term (current) use of oral hypoglycemic drugs: Secondary | ICD-10-CM | POA: Diagnosis not present

## 2023-05-01 DIAGNOSIS — I10 Essential (primary) hypertension: Secondary | ICD-10-CM | POA: Diagnosis present

## 2023-05-01 DIAGNOSIS — Z87891 Personal history of nicotine dependence: Secondary | ICD-10-CM | POA: Diagnosis not present

## 2023-05-01 DIAGNOSIS — F32A Depression, unspecified: Secondary | ICD-10-CM | POA: Diagnosis present

## 2023-05-01 DIAGNOSIS — J9621 Acute and chronic respiratory failure with hypoxia: Secondary | ICD-10-CM | POA: Diagnosis present

## 2023-05-01 DIAGNOSIS — J439 Emphysema, unspecified: Secondary | ICD-10-CM | POA: Diagnosis present

## 2023-05-01 DIAGNOSIS — Z7722 Contact with and (suspected) exposure to environmental tobacco smoke (acute) (chronic): Secondary | ICD-10-CM | POA: Diagnosis present

## 2023-05-01 DIAGNOSIS — J440 Chronic obstructive pulmonary disease with acute lower respiratory infection: Secondary | ICD-10-CM | POA: Diagnosis present

## 2023-05-01 DIAGNOSIS — Z515 Encounter for palliative care: Secondary | ICD-10-CM | POA: Diagnosis not present

## 2023-05-01 DIAGNOSIS — N400 Enlarged prostate without lower urinary tract symptoms: Secondary | ICD-10-CM | POA: Diagnosis present

## 2023-05-01 LAB — COMPREHENSIVE METABOLIC PANEL
ALT: 14 U/L (ref 0–44)
AST: 14 U/L — ABNORMAL LOW (ref 15–41)
Albumin: 2.9 g/dL — ABNORMAL LOW (ref 3.5–5.0)
Alkaline Phosphatase: 72 U/L (ref 38–126)
Anion gap: 8 (ref 5–15)
BUN: 15 mg/dL (ref 8–23)
CO2: 27 mmol/L (ref 22–32)
Calcium: 8.3 mg/dL — ABNORMAL LOW (ref 8.9–10.3)
Chloride: 100 mmol/L (ref 98–111)
Creatinine, Ser: 0.88 mg/dL (ref 0.61–1.24)
GFR, Estimated: >60 mL/min (ref >60)
Glucose, Bld: 112 mg/dL — ABNORMAL HIGH (ref 70–99)
Potassium: 4 mmol/L (ref 3.5–5.1)
Sodium: 135 mmol/L (ref 135–145)
Total Bilirubin: 0.4 mg/dL (ref 0.0–1.2)
Total Protein: 6.1 g/dL — ABNORMAL LOW (ref 6.5–8.1)

## 2023-05-01 LAB — CBC
HCT: 36.1 % — ABNORMAL LOW (ref 39.0–52.0)
Hemoglobin: 11.3 g/dL — ABNORMAL LOW (ref 13.0–17.0)
MCH: 29.5 pg (ref 26.0–34.0)
MCHC: 31.3 g/dL (ref 30.0–36.0)
MCV: 94.3 fL (ref 80.0–100.0)
Platelets: 332 10*3/uL (ref 150–400)
RBC: 3.83 MIL/uL — ABNORMAL LOW (ref 4.22–5.81)
RDW: 13.2 % (ref 11.5–15.5)
WBC: 8.5 10*3/uL (ref 4.0–10.5)
nRBC: 0 % (ref 0.0–0.2)

## 2023-05-01 LAB — GLUCOSE, CAPILLARY
Glucose-Capillary: 177 mg/dL — ABNORMAL HIGH (ref 70–99)
Glucose-Capillary: 95 mg/dL (ref 70–99)
Glucose-Capillary: 143 mg/dL — ABNORMAL HIGH (ref 70–99)
Glucose-Capillary: 120 mg/dL — ABNORMAL HIGH (ref 70–99)

## 2023-05-01 LAB — MAGNESIUM: Magnesium: 1.9 mg/dL (ref 1.7–2.4)

## 2023-05-01 MED ORDER — AZITHROMYCIN 250 MG PO TABS
500.0000 mg | ORAL_TABLET | Freq: Every day | ORAL | Status: AC
  Administered 2023-05-01 – 2023-05-03 (×3): 500 mg via ORAL
  Filled 2023-05-01 (×3): qty 2

## 2023-05-01 MED ORDER — OXYMETAZOLINE HCL 0.05 % NA SOLN
1.0000 | Freq: Two times a day (BID) | NASAL | Status: AC
  Administered 2023-05-01 – 2023-05-03 (×6): 1 via NASAL
  Filled 2023-05-01 (×2): qty 15

## 2023-05-01 NOTE — Plan of Care (Signed)
   Problem: Fluid Volume: Goal: Ability to maintain a balanced intake and output will improve Outcome: Progressing   Problem: Skin Integrity: Goal: Risk for impaired skin integrity will decrease Outcome: Progressing   Problem: Tissue Perfusion: Goal: Adequacy of tissue perfusion will improve Outcome: Progressing

## 2023-05-01 NOTE — Progress Notes (Signed)
 PROGRESS NOTE  Mario [name]:[id number] DOB: 14-Apr-1950 DOA: 04/28/2023 PCP: [name]   LOS: 0 days   Brief Narrative / Interim history: 73 year old male with advanced COPD with chronic hypoxic respiratory failure on 4 to 5 L of oxygen at home, history of PE now off anticoagulation due to hemoptysis and epistaxis.  He has had several back-to-back hospitalizations this year, he comes in for shortness of breath, is treated for COPD exacerbation, but upon return home he seeks care within hours/few days.  He recently had an RSV infection earlier this year and has been struggling since.  Most recent hospitalizations reviewed, was in the hospital 1/17-2/1 stage, readmitted the same day in 2/1-2/24, developed hemoptysis and epistaxis while on Eliquis, and he was taken off anticoagulation.  Underwent bronchoscopy 2/17, BAL and biopsy consistent with adenocarcinoma.  He was discharged home 2/24, returned the same day with suicidal ideation, psychiatry saw him, he was cleared for discharge on 2/28, return home but by the end of the day he decided to come back to the hospital as he saw his sats go into the 60s.  Subjective / 24h Interval events: Continues to have significant shortness of breath with minimal activity  Assesement and Plan: Principal Problem:   Right lower lobe pneumonia Active Problems:   Adjustment disorder with mixed disturbance of emotions and conduct   Acute on chronic hypoxic respiratory failure (HCC)   DM2 (diabetes mellitus, type 2) (HCC)   COPD (chronic obstructive pulmonary disease) (HCC)   Non-small cell cancer of 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   Essential hypertension   history of Pulmonary embolism (HCC)   BPH (benign prostatic hyperplasia)   OSA (obstructive sleep apnea)   Anxiety  Principal problem Acute on chronic hypoxic respiratory failure, advanced COPD, left lung adenocarcinoma -patient with multiple hospitalizations in the last 2  months, it seems like he is unable to function at all at home and is seeking readmission within 1 to 2 days following discharge for significant dyspnea, especially upon going up a flight of stairs in his home.  He thinks he will need to change living situation, discussed with TOC, she will talk with the patient today -For now continue antibiotics, nebulizers and supportive care for his respiratory status.  Active problems History of PE-most recent CT angiogram without clots.  Off anticoagulation due to recent hemoptysis and epistaxis  DM2-continue sliding scale  Depression/anxiety-seen by psychiatry recently for suicidal ideation, cleared.  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  Goals of care-palliative consulted, remains full code/full scope of care  Scheduled Meds:  arformoterol  15 mcg Nebulization BID   And   umeclidinium bromide  1 puff Inhalation Daily   azithromycin  500 mg Oral QHS   buPROPion  300 mg Oral q morning   busPIRone  15 mg Oral Daily   cycloSPORINE  1 drop Both Eyes Q12H   diltiazem  180 mg Oral Daily   DULoxetine  60 mg Oral BID   gabapentin  100 mg Oral TID   guaiFENesin  600 mg Oral BID   hydrALAZINE  25 mg Oral BID   insulin aspart  0-9 Units Subcutaneous TID WC   melatonin  6 mg Oral QHS   OXcarbazepine  600 mg Oral TID   oxymetazoline  1 spray Each Nare BID   sodium chloride flush  3 mL Intravenous Q12H   tamsulosin  0.8 mg Oral Daily   traZODone  75 mg Oral QHS   Continuous Infusions:  cefTRIAXone (ROCEPHIN)  IV 2 g (05/01/23 0927)   PRN Meds:.acetaminophen **OR** acetaminophen, albuterol, bisacodyl, cyclobenzaprine, HYDROcodone-acetaminophen, ondansetron **OR** ondansetron (ZOFRAN) IV, senna-docusate, SUMAtriptan  Current Outpatient Medications  Medication Instructions   acetaminophen (TYLENOL) 1,000 mg, Every 6 hours PRN   albuterol (PROVENTIL) 2.5 mg, Nebulization, Every 6 hours PRN   albuterol (VENTOLIN HFA)  108 (90 Base) MCG/ACT inhaler 2 puffs, Inhalation, Every 4 hours PRN   azelastine (ASTELIN) 0.1 % nasal spray 1 spray, Each Nare, 2 times daily PRN, Use in each nostril as directed   bisacodyl (DULCOLAX) 10 mg, Oral, Daily PRN   buPROPion (WELLBUTRIN XL) 300 mg, Oral, Every morning   busPIRone (BUSPAR) 15 mg, Oral, Every morning   Carboxymethylcellulose Sod PF 0.5 % SOLN 1 drop, 4 times daily PRN   cholecalciferol (VITAMIN D3) 1,000 Units, Daily   cyclobenzaprine (FLEXERIL) 10 mg, At bedtime PRN   cycloSPORINE (RESTASIS) 0.05 % ophthalmic emulsion 1 drop, Every 12 hours   diltiazem (CARDIZEM CD) 180 mg, Oral, Every morning   docusate sodium (COLACE) 200 mg, 2 times daily   DULoxetine (CYMBALTA) 60 mg, Oral, 2 times daily   gabapentin (NEURONTIN) 100 mg, Oral, 3 times daily   hydrALAZINE (APRESOLINE) 25 mg, Oral, 2 times daily   HYDROcodone-acetaminophen (NORCO) 10-325 MG tablet 1 tablet, Every 6 hours PRN   LACTULOSE PO 2 Scoops, 2 times daily PRN   Melatonin 6 mg, Daily at bedtime   metFORMIN (GLUCOPHAGE) 850 mg, Every evening   omeprazole (PRILOSEC) 20 mg, Oral, Daily   Oxcarbazepine (TRILEPTAL) 600 mg, 3 times daily   Oxycodone HCl 10 mg, Oral, 2 times daily PRN   OXYGEN 5 L/min, Inhalation, Every 24 hours   psyllium (HYDROCIL/METAMUCIL) 95 % PACK 1 packet, Oral, Daily   sodium chloride (OCEAN) 0.65 % SOLN nasal spray 1 spray, Each Nare, Daily   tamsulosin (FLOMAX) 0.8 mg, Oral, Daily   Tiotropium Bromide-Olodaterol 2.5-2.5 MCG/ACT AERS 2 each, Every morning   traZODone (DESYREL) 75 mg, Daily at bedtime    Diet Orders (From admission, onward)     Start     Ordered   04/29/23 1707  Diet regular Room service appropriate? Yes; Fluid consistency: Thin  Diet effective now       Question Answer Comment  Room service appropriate? Yes   Fluid consistency: Thin      04/29/23 1706            DVT prophylaxis: SCDs Start: 04/28/23 2256   Lab Results  Component Value Date    PLT 332 05/01/2023      Code Status: Full Code  Family Communication: No family at bedside  Status is: Inpatient  Level of care: Med-Surg  Consultants:  None  Objective: Vitals:   04/30/23 2136 05/01/23 0500 05/01/23 0537 05/01/23 0853  BP: (!) 153/78  112/68   Pulse: 96  80   Resp: 18  17   Temp: 97.8 F (36.6 C)  98.4 F (36.9 C)   TempSrc: Oral  Oral   SpO2: 94%  93% 95%  Weight:  (!) 137 kg    Height:        Intake/Output Summary (Last 24 hours) at 05/01/2023 1143 Last data filed at 05/01/2023 1000 Gross per 24 hour  Intake 2097.32 ml  Output 850 ml  Net 1247.32 ml   Wt Readings from Last 3 Encounters:  05/01/23 (!) 137 kg  04/24/23 (!) 138 kg  04/18/23 [name])  138 kg    Examination:  Constitutional: NAD Eyes: lids and conjunctivae normal, no scleral icterus ENMT: mmm Neck: normal, supple Respiratory: clear to auscultation bilaterally, no wheezing, no crackles. Normal respiratory effort.  Cardiovascular: Regular rate and rhythm, no murmurs / rubs / gallops. No LE edema. Abdomen: soft, no distention, no tenderness. Bowel sounds positive.   Data Reviewed: I have independently reviewed following labs and imaging studies   CBC Recent Labs  Lab 04/24/23 1850 04/28/23 1840 04/29/23 0700 04/30/23 0506 05/01/23 0511  WBC 11.1* 10.7* 9.4 8.6 8.5  HGB 12.6* 12.7* 12.8* 11.7* 11.3*  HCT 37.5* 39.7 39.5 37.5* 36.1*  PLT 353 379 367 337 332  MCV 91.0 91.3 90.8 93.5 94.3  MCH 30.6 29.2 29.4 29.2 29.5  MCHC 33.6 32.0 32.4 31.2 31.3  RDW 13.1 13.2 13.2 13.1 13.2  LYMPHSABS  --  1.6  --   --   --   MONOABS  --  1.1*  --   --   --   EOSABS  --  0.2  --   --   --   BASOSABS  --  0.1  --   --   --     Recent Labs  Lab 04/24/23 1850 04/28/23 1840 04/29/23 0700 04/30/23 0506 05/01/23 0511  NA 131* 135 137 136 135  K 4.2 4.0 3.9 4.0 4.0  CL 95* 100 101 100 100  CO2 27 24 27 27 27   GLUCOSE 220* 135* 132* 131* 112*  BUN 13 14 15 20 15   CREATININE 0.78 0.75  0.85 1.00 0.88  CALCIUM 8.5* 8.8* 8.9 8.5* 8.3*  AST 20 18  --  17 14*  ALT 16 17  --  16 14  ALKPHOS 79 82  --  74 72  BILITOT 0.3 0.5  --  0.5 0.4  ALBUMIN 3.3* 3.5  --  3.0* 2.9*  MG  --   --   --  2.1 1.9  BNP  --  45.4  --   --   --     ------------------------------------------------------------------------------------------------------------------ No results for input(s): "CHOL", "HDL", "LDLCALC", "TRIG", "CHOLHDL", "LDLDIRECT" in the last 72 hours.  Lab Results  Component Value Date   HGBA1C 6.7 (H) 03/18/2023   ------------------------------------------------------------------------------------------------------------------ No results for input(s): "TSH", "T4TOTAL", "T3FREE", "THYROIDAB" in the last 72 hours.  Invalid input(s): "FREET3"  Cardiac Enzymes No results for input(s): "CKMB", "TROPONINI", "MYOGLOBIN" in the last 168 hours.  Invalid input(s): "CK" ------------------------------------------------------------------------------------------------------------------    Component Value Date/Time   BNP 45.4 04/28/2023 1840    CBG: Recent Labs  Lab 04/30/23 0715 04/30/23 1144 04/30/23 1649 04/30/23 2138 05/01/23 0907  GLUCAP 112* 99 142* 133* 177*    Recent Results (from the past 240 hours)  SARS Coronavirus 2 by RT PCR (hospital order, performed in [facility] hospital lab) *cepheid single result test* Anterior Nasal Swab     Status: None   Collection Time: 04/27/23  2:07 PM   Specimen: Anterior Nasal Swab  Result Value Ref Range Status   SARS Coronavirus 2 by RT PCR NEGATIVE NEGATIVE Final    Comment: (NOTE) SARS-CoV-2 target nucleic acids are NOT DETECTED.  The SARS-CoV-2 RNA is generally detectable in upper and lower respiratory specimens during the acute phase of infection. The lowest concentration of SARS-CoV-2 viral copies this assay can detect is 250 copies / mL. A negative result does not preclude SARS-CoV-2 infection and should not be used  as the sole basis for treatment or other patient management  decisions.  A negative result may occur with improper specimen collection / handling, submission of specimen other than nasopharyngeal swab, presence of viral mutation(s) within the areas targeted by this assay, and inadequate number of viral copies (<250 copies / mL). A negative result must be combined with clinical observations, patient history, and epidemiological information.  Fact Sheet for Patients:   RoadLapTop.co.za  Fact Sheet for Healthcare Providers: http://kim-miller.com/  This test is not yet approved or  cleared by the [place] FDA and has been authorized for detection and/or diagnosis of SARS-CoV-2 by FDA under an Emergency Use Authorization (EUA).  This EUA will remain in effect (meaning this test can be used) for the duration of the COVID-19 declaration under Section 564(b)(1) of the Act, 21 U.S.C. section 360bbb-3(b)(1), unless the authorization is terminated or revoked sooner.  Performed at [facility], [address], [place], [place] [address]   Resp panel by RT-PCR (RSV, Flu A&B, Covid) Anterior Nasal Swab     Status: None   Collection Time: 04/28/23  9:41 PM   Specimen: Anterior Nasal Swab  Result Value Ref Range Status   SARS Coronavirus 2 by RT PCR NEGATIVE NEGATIVE Final    Comment: (NOTE) SARS-CoV-2 target nucleic acids are NOT DETECTED.  The SARS-CoV-2 RNA is generally detectable in upper respiratory specimens during the acute phase of infection. The lowest concentration of SARS-CoV-2 viral copies this assay can detect is 138 copies/mL. A negative result does not preclude SARS-Cov-2 infection and should not be used as the sole basis for treatment or other patient management decisions. A negative result may occur with  improper specimen collection/handling, submission of specimen other than nasopharyngeal swab, presence of  viral mutation(s) within the areas targeted by this assay, and inadequate number of viral copies(<138 copies/mL). A negative result must be combined with clinical observations, patient history, and epidemiological information. The expected result is Negative.  Fact Sheet for Patients:  BloggerCourse.com  Fact Sheet for Healthcare Providers:  SeriousBroker.it  This test is no t yet approved or cleared by the [place] FDA and  has been authorized for detection and/or diagnosis of SARS-CoV-2 by FDA under an Emergency Use Authorization (EUA). This EUA will remain  in effect (meaning this test can be used) for the duration of the COVID-19 declaration under Section 564(b)(1) of the Act, 21 U.S.C.section 360bbb-3(b)(1), unless the authorization is terminated  or revoked sooner.       Influenza A by PCR NEGATIVE NEGATIVE Final   Influenza B by PCR NEGATIVE NEGATIVE Final    Comment: (NOTE) The Xpert Xpress SARS-CoV-2/FLU/RSV plus assay is intended as an aid in the diagnosis of influenza from Nasopharyngeal swab specimens and should not be used as a sole basis for treatment. Nasal washings and aspirates are unacceptable for Xpert Xpress SARS-CoV-2/FLU/RSV testing.  Fact Sheet for Patients: BloggerCourse.com  Fact Sheet for Healthcare Providers: SeriousBroker.it  This test is not yet approved or cleared by the [place] FDA and has been authorized for detection and/or diagnosis of SARS-CoV-2 by FDA under an Emergency Use Authorization (EUA). This EUA will remain in effect (meaning this test can be used) for the duration of the COVID-19 declaration under Section 564(b)(1) of the Act, 21 U.S.C. section 360bbb-3(b)(1), unless the authorization is terminated or revoked.     Resp Syncytial Virus by PCR NEGATIVE NEGATIVE Final    Comment: (NOTE) Fact Sheet for  Patients: BloggerCourse.com  Fact Sheet for Healthcare Providers: SeriousBroker.it  This test is not yet  approved or cleared by the [place] and has been authorized for detection and/or diagnosis of SARS-CoV-2 by FDA under an Emergency Use Authorization (EUA). This EUA will remain in effect (meaning this test can be used) for the duration of the COVID-19 declaration under Section 564(b)(1) of the Act, 21 U.S.C. section 360bbb-3(b)(1), unless the authorization is terminated or revoked.  Performed at [facility], [address], [place], [place] [address]      Radiology Studies: No results found.    Pamella Pert, MD, PhD [facility]  Between 7 am - 7 pm I am available, please contact me via Amion (for emergencies) or Securechat (non urgent messages)  Between 7 pm - 7 am I am not available, please contact night coverage MD/APP via Amion

## 2023-05-01 NOTE — TOC Progression Note (Signed)
 Transition of Care [facility]) - Progression Note    Patient Details  Name: Mario [name] MRN: [id number] Date of Birth: Jun 05, 1950  Transition of Care [facility]) CM/SW Contact  Howell Rucks, RN Phone Number: 05/01/2023, 4:13 PM  Clinical Narrative:   Met with attending on nursing unit, expressed concerns regarding pt's multiple recent hospitalizations ,states patient basically stays home 1-2 days then returns to the ER. He has 1 flight of stairs at home which is the problem, and he is working with his landlord to see if he can more to a 1 level place. Requesting  TOC assistance with resources to assist patient.  Patient currently receiving home caregiver services through [employer]. NCM outreached to [employer] to inquire on current home services and available home services, provided contact name and number: Lowella Fairy, phone [phone]. NCM called to [facility], no answer, left vm with NCM name and phone number requesting call back.        Expected Discharge Plan: Home w Home Health Services Barriers to Discharge: Continued Medical Work up  Expected Discharge Plan and Services   Discharge Planning Services: CM Consult   Living arrangements for the past 2 months: Single Family Home                                       Social Determinants of Health (SDOH) Interventions SDOH Screenings   Food Insecurity: No Food Insecurity (04/30/2023)  Housing: Low Risk  (04/30/2023)  Transportation Needs: No Transportation Needs (04/30/2023)  Utilities: Not At Risk (04/30/2023)  Financial Resource Strain: Low Risk  (09/23/2020)  Social Connections: Socially Isolated (04/29/2023)  Stress: No Stress Concern Present (09/23/2020)  Tobacco Use: Medium Risk (04/24/2023)    Readmission Risk Interventions    04/24/2023   10:09 AM 03/25/2023    2:23 PM 11/03/2021   11:50 AM  Readmission Risk Prevention Plan  Transportation Screening Complete Complete Complete  Medication Review Furniture conservator/restorer) Complete Complete Complete  PCP or Specialist appointment within 3-5 days of discharge   Complete  HRI or Home Care Consult Complete Complete Complete  SW Recovery Care/Counseling Consult Complete Complete Complete  Palliative Care Screening Not Applicable Not Applicable Not Applicable  Skilled Nursing Facility Not Applicable Not Applicable Not Applicable

## 2023-05-01 NOTE — Progress Notes (Signed)
 Mobility Specialist - Progress Note  Pre-mobility: 102 bpm HR, 91% SpO2 ([place] 4L)  During mobility: 117 bpm HR, 80% SpO2 ([place] 6L)  Post-mobility: 104 bpm HR, 92% SPO2 ([place] 4L)    05/01/23 1427  Mobility  Activity Ambulated independently in hallway  Level of Assistance Independent after set-up  Assistive Device None  Distance Ambulated (ft) 80 ft  Range of Motion/Exercises Active  Mobility Referral Yes  Mobility visit 1 Mobility  Mobility Specialist Start Time (ACUTE ONLY) 1415  Mobility Specialist Stop Time (ACUTE ONLY) 1427  Mobility Specialist Time Calculation (min) (ACUTE ONLY) 12 min   Pt was found sitting EOB and agreeable to ambulate. Stated feeling unwell. At EOS returned to sit EOB. Able to increase SPO2 >90% within 1 min. Was left with all needs met. Call bell in reach.  Billey Chang Mobility Specialist

## 2023-05-01 NOTE — TOC Transition Note (Deleted)
 Transition of Care [facility] - Discharge Note   Patient Details  Name: Mario [name] MRN: [id number] Date of Birth: May 12, 1950  Transition of Care [facility]) CM/SW Contact:  Howell Rucks, RN Phone Number: 05/01/2023, 4:04 PM   Clinical Narrative:  Met with attending on nursing unit, expressed concerns regarding pt's multiple recent hospitalizations ,states patient basically stays home 1-2 days then returns to the ER. He has 1 flight of stairs at home which is the problem, and he is working with his landlord to see if he can more to a 1 level place. Requesting  TOC assistance with resources to assist patient.  Patient currently receiving home caregiver services through [employer]. NCM outreached to [employer] to inquire on current home services and available home services, provided contact name and number: Lowella Fairy, phone [phone]. NCM called to [facility], no answer, left vm with NCM name and phone number requesting call back.         Barriers to Discharge: Continued Medical Work up   Patient Goals and CMS Choice Patient states their goals for this hospitalization and ongoing recovery are:: home CMS Medicare.gov Compare Post Acute Care list provided to:: Patient        Discharge Placement                       Discharge Plan and Services Additional resources added to the After Visit Summary for     Discharge Planning Services: CM Consult                                 Social Drivers of Health (SDOH) Interventions SDOH Screenings   Food Insecurity: No Food Insecurity (04/30/2023)  Housing: Low Risk  (04/30/2023)  Transportation Needs: No Transportation Needs (04/30/2023)  Utilities: Not At Risk (04/30/2023)  Financial Resource Strain: Low Risk  (09/23/2020)  Social Connections: Socially Isolated (04/29/2023)  Stress: No Stress Concern Present (09/23/2020)  Tobacco Use: Medium Risk (04/24/2023)     Readmission Risk Interventions    04/24/2023    10:09 AM 03/25/2023    2:23 PM 11/03/2021   11:50 AM  Readmission Risk Prevention Plan  Transportation Screening Complete Complete Complete  Medication Review Oceanographer) Complete Complete Complete  PCP or Specialist appointment within 3-5 days of discharge   Complete  HRI or Home Care Consult Complete Complete Complete  SW Recovery Care/Counseling Consult Complete Complete Complete  Palliative Care Screening Not Applicable Not Applicable Not Applicable  Skilled Nursing Facility Not Applicable Not Applicable Not Applicable

## 2023-05-01 NOTE — Progress Notes (Signed)
   Palliative Medicine Inpatient Follow Up Note HPI: 73 year old male with advanced COPD with chronic hypoxic respiratory failure on 4 to 5 L of oxygen at home, history of PE now off anticoagulation due to hemoptysis and epistaxis. The Palliative care team has been asked to get involved to address goals of care given recurrent hospitalizations.   Today's Discussion 05/01/2023  *Please note that this is a verbal dictation therefore any spelling or grammatical errors are due to the "Dragon Medical One" system interpretation.  Chart reviewed inclusive of vital signs, progress notes, laboratory results, and diagnostic images.   I met with Mario [name] at bedside this afternoon. We discussed that he has had a recurring headache for an indefinite amount of time. He shares that there is some relief with norco. He notes that he otherwise feels about the same.   We discussed the conversation from the day prior. I shared again the importance of completing advanced directives. He and I reviewed the HCPOA and living will together. He was able to fill it in per his wishes. We reviewed the plan for the chaplain to come by to coordinate formal notarization.   Mario [name] shared with me pictures of his granddaughter who bring him great joy. Allowed time and space for reflection.   Questions and concerns addressed/Palliative Support Provided.   Objective Assessment: Vital Signs Vitals:   05/01/23 0537 05/01/23 0853  BP: 112/68   Pulse: 80   Resp: 17   Temp: 98.4 F (36.9 C)   SpO2: 93% 95%    Intake/Output Summary (Last 24 hours) at 05/01/2023 1304 Last data filed at 05/01/2023 1000 Gross per 24 hour  Intake 2097.32 ml  Output 850 ml  Net 1247.32 ml   Last Weight  Most recent update: 05/01/2023  6:59 AM    Weight  137 kg (302 lb 0.5 oz)              Gen:  Elderly Caucasian M chronically ill-appearing HEENT: moist mucous membranes CV: Irregular rate and irregular rhythm PULM: On 4 L nasal cannula  breathing is even and not labored able to complete sentences ABD: soft/nontender EXT: No edema Neuro: Alert and oriented x3  SUMMARY OF RECOMMENDATIONS   Full code/full scope of care --> Hard choices booklet provided   ADs completed --> Appreciate Chaplain support for notarization   Open and honest conversations held in the setting of patient's severe end-stage COPD --> he presently is not at a point whereby he would like to pursue hospice care as this would no longer enable him to get serial scans to identify as his cancer is progressing   Appreciate TOC helping with long term housing options on first floor --> (+) VA benefits but only 30% service connected   Ongoing palliative care support  Billing based on MDM: High in the setting of advanced care planning ______________________________________________________________________________________ Mario [name] [facility] Palliative Medicine Team Team Cell Phone: [phone] Please utilize secure chat with additional questions, if there is no response within 30 minutes please call the above phone number  Palliative Medicine Team providers are available by phone from 7am to 7pm daily and can be reached through the team cell phone.  Should this patient require assistance outside of these hours, please call the patient's attending physician.

## 2023-05-02 ENCOUNTER — Inpatient Hospital Stay (HOSPITAL_COMMUNITY)

## 2023-05-02 ENCOUNTER — Encounter (HOSPITAL_COMMUNITY): Payer: Self-pay | Admitting: Internal Medicine

## 2023-05-02 DIAGNOSIS — J189 Pneumonia, unspecified organism: Secondary | ICD-10-CM | POA: Diagnosis not present

## 2023-05-02 LAB — GLUCOSE, CAPILLARY
Glucose-Capillary: 126 mg/dL — ABNORMAL HIGH (ref 70–99)
Glucose-Capillary: 115 mg/dL — ABNORMAL HIGH (ref 70–99)
Glucose-Capillary: 146 mg/dL — ABNORMAL HIGH (ref 70–99)
Glucose-Capillary: 156 mg/dL — ABNORMAL HIGH (ref 70–99)

## 2023-05-02 MED ORDER — NAPHAZOLINE-PHENIRAMINE 0.025-0.3 % OP SOLN
1.0000 [drp] | Freq: Four times a day (QID) | OPHTHALMIC | Status: DC | PRN
  Administered 2023-05-02: 1 [drp] via OPHTHALMIC
  Filled 2023-05-02: qty 15

## 2023-05-02 MED ORDER — KETOROLAC TROMETHAMINE 15 MG/ML IJ SOLN: 15.0000 mg | Freq: Three times a day (TID) | INTRAMUSCULAR | Status: DC | PRN

## 2023-05-02 NOTE — Progress Notes (Signed)
 PROGRESS NOTE  WINFORD HEHN ZOX:[id number] DOB: 1950/05/30 DOA: 04/28/2023 PCP: [name]   LOS: 1 day   Brief Narrative / Interim history: 73 year old male with advanced COPD with chronic hypoxic respiratory failure on 4 to 5 L of oxygen at home, history of PE now off anticoagulation due to hemoptysis and epistaxis.  He has had several back-to-back hospitalizations this year, he comes in for shortness of breath, is treated for COPD exacerbation, but upon return home he seeks care within hours/few days.  He recently had an RSV infection earlier this year and has been struggling since.  Most recent hospitalizations reviewed, was in the hospital 1/17-2/1 stage, readmitted the same day in 2/1-2/24, developed hemoptysis and epistaxis while on Eliquis, and he was taken off anticoagulation.  Underwent bronchoscopy 2/17, BAL and biopsy consistent with adenocarcinoma.  He was discharged home 2/24, returned the same day with suicidal ideation, psychiatry saw him, he was cleared for discharge on 2/28, return home but by the end of the day he decided to come back to the hospital as he saw his sats go into the 60s.  Subjective / 24h Interval events: Complains of worsening cough today, also right shoulder pain, also dry eyes  Assesement and Plan: Principal Problem:   Right lower lobe pneumonia Active Problems:   Adjustment disorder with mixed disturbance of emotions and conduct   Acute on chronic hypoxic respiratory failure (HCC)   DM2 (diabetes mellitus, type 2) (HCC)   COPD (chronic obstructive pulmonary disease) (HCC)   Non-small cell cancer of 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   Essential hypertension   history of Pulmonary embolism (HCC)   BPH (benign prostatic hyperplasia)   OSA (obstructive sleep apnea)   Anxiety  Principal problem Acute on chronic hypoxic respiratory failure, advanced COPD, left lung adenocarcinoma -patient with multiple hospitalizations in the  last 2 months, it seems like he is unable to function at all at home and is seeking readmission within 1 to 2 days following discharge for significant dyspnea, especially upon going up a flight of stairs in his home.  He thinks he will need to change living situation, discussed with TOC, she will discuss with the patient -CT scan on admission with increased consolidation and groundglass opacities in the right lower lobe, favoring worsening pneumonia.  I wonder whether he has a degree of mucous plugging in that area -For now continue antibiotics, today's day #3 and I doubt he will need more than 5 total days, continue nebulizers and supportive care for his respiratory status. -Repeat chest x-ray given worsening cough  Active problems History of PE-most recent CT angiogram without clots.  Off anticoagulation due to recent hemoptysis and epistaxis  DM2-continue sliding scale  Depression/anxiety-seen by psychiatry recently for suicidal ideation, cleared.  Continue home medications  OSA-continue CPAP  Right shoulder pain -for several months, suspect a degree of rotator cuff issues.  He is asking me to get this addressed, however he has full range of motion and I doubt anything catastrophic.  He is not a candidate for any form of surgical intervention anyway. -OT consult today  Obesity, class II-BMI 39  Goals of care-palliative consulted, remains full code/full scope of care  Disposition-overall difficult situation, he currently is renting a place and his bedroom is upstairs in the kitchen is downstairs.  He is fairly okay walking few feet on level ground, however going up stairs is what is getting him very winded.  It is quite often that  he desats into the 60s-70s upon going up a flight of stairs, and upon not being able to recover and getting more and more dyspneic he called 911.  Probably could discharge home once he finishes antibiotics, however very high chance of readmission  Scheduled Meds:   arformoterol  15 mcg Nebulization BID   And   umeclidinium bromide  1 puff Inhalation Daily   azithromycin  500 mg Oral QHS   buPROPion  300 mg Oral q morning   busPIRone  15 mg Oral Daily   cycloSPORINE  1 drop Both Eyes Q12H   diltiazem  180 mg Oral Daily   DULoxetine  60 mg Oral BID   gabapentin  100 mg Oral TID   guaiFENesin  600 mg Oral BID   hydrALAZINE  25 mg Oral BID   insulin aspart  0-9 Units Subcutaneous TID WC   melatonin  6 mg Oral QHS   OXcarbazepine  600 mg Oral TID   oxymetazoline  1 spray Each Nare BID   sodium chloride flush  3 mL Intravenous Q12H   tamsulosin  0.8 mg Oral Daily   traZODone  75 mg Oral QHS   Continuous Infusions:  cefTRIAXone (ROCEPHIN)  IV 2 g (05/02/23 0935)   PRN Meds:.acetaminophen **OR** acetaminophen, albuterol, bisacodyl, cyclobenzaprine, HYDROcodone-acetaminophen, ketorolac, naphazoline-pheniramine, ondansetron **OR** ondansetron (ZOFRAN) IV, senna-docusate  Current Outpatient Medications  Medication Instructions   acetaminophen (TYLENOL) 1,000 mg, Every 6 hours PRN   albuterol (PROVENTIL) 2.5 mg, Nebulization, Every 6 hours PRN   albuterol (VENTOLIN HFA) 108 (90 Base) MCG/ACT inhaler 2 puffs, Inhalation, Every 4 hours PRN   azelastine (ASTELIN) 0.1 % nasal spray 1 spray, Each Nare, 2 times daily PRN, Use in each nostril as directed   bisacodyl (DULCOLAX) 10 mg, Oral, Daily PRN   buPROPion (WELLBUTRIN XL) 300 mg, Oral, Every morning   busPIRone (BUSPAR) 15 mg, Oral, Every morning   Carboxymethylcellulose Sod PF 0.5 % SOLN 1 drop, 4 times daily PRN   cholecalciferol (VITAMIN D3) 1,000 Units, Daily   cyclobenzaprine (FLEXERIL) 10 mg, At bedtime PRN   cycloSPORINE (RESTASIS) 0.05 % ophthalmic emulsion 1 drop, Every 12 hours   diltiazem (CARDIZEM CD) 180 mg, Oral, Every morning   docusate sodium (COLACE) 200 mg, 2 times daily   DULoxetine (CYMBALTA) 60 mg, Oral, 2 times daily   gabapentin (NEURONTIN) 100 mg, Oral, 3 times daily    hydrALAZINE (APRESOLINE) 25 mg, Oral, 2 times daily   HYDROcodone-acetaminophen (NORCO) 10-325 MG tablet 1 tablet, Every 6 hours PRN   LACTULOSE PO 2 Scoops, 2 times daily PRN   Melatonin 6 mg, Daily at bedtime   metFORMIN (GLUCOPHAGE) 850 mg, Every evening   omeprazole (PRILOSEC) 20 mg, Oral, Daily   Oxcarbazepine (TRILEPTAL) 600 mg, 3 times daily   Oxycodone HCl 10 mg, Oral, 2 times daily PRN   OXYGEN 5 L/min, Inhalation, Every 24 hours   psyllium (HYDROCIL/METAMUCIL) 95 % PACK 1 packet, Oral, Daily   sodium chloride (OCEAN) 0.65 % SOLN nasal spray 1 spray, Each Nare, Daily   tamsulosin (FLOMAX) 0.8 mg, Oral, Daily   Tiotropium Bromide-Olodaterol 2.5-2.5 MCG/ACT AERS 2 each, Every morning   traZODone (DESYREL) 75 mg, Daily at bedtime    Diet Orders (From admission, onward)     Start     Ordered   04/29/23 1707  Diet regular Room service appropriate? Yes; Fluid consistency: Thin  Diet effective now       Question Answer Comment  Room service  appropriate? Yes   Fluid consistency: Thin      04/29/23 1706            DVT prophylaxis: SCDs Start: 04/28/23 2256   Lab Results  Component Value Date   PLT 332 05/01/2023      Code Status: Full Code  Family Communication: No family at bedside  Status is: Inpatient  Level of care: Med-Surg  Consultants:  None  Objective: Vitals:   05/01/23 2109 05/02/23 0442 05/02/23 0445 05/02/23 1040  BP: (!) 161/87 (!) 147/72    Pulse: 97 82    Resp: 18 18    Temp: 98.4 F (36.9 C) 97.7 F (36.5 C)    TempSrc: Oral Oral    SpO2: 94% 92%  96%  Weight:   135.5 kg   Height:        Intake/Output Summary (Last 24 hours) at 05/02/2023 1147 Last data filed at 05/02/2023 1610 Gross per 24 hour  Intake 2260 ml  Output 1975 ml  Net 285 ml   Wt Readings from Last 3 Encounters:  05/02/23 135.5 kg  04/24/23 (!) 138 kg  04/18/23 (!) 138 kg    Examination:  Constitutional: NAD Eyes: lids and conjunctivae normal, no scleral  icterus ENMT: mmm Neck: normal, supple Respiratory: clear to auscultation bilaterally, no wheezing, no crackles. Normal respiratory effort.  Cardiovascular: Regular rate and rhythm, no murmurs / rubs / gallops. No LE edema. Abdomen: soft, no distention, no tenderness. Bowel sounds positive.   Data Reviewed: I have independently reviewed following labs and imaging studies   CBC Recent Labs  Lab 04/28/23 1840 04/29/23 0700 04/30/23 0506 05/01/23 0511  WBC 10.7* 9.4 8.6 8.5  HGB 12.7* 12.8* 11.7* 11.3*  HCT 39.7 39.5 37.5* 36.1*  PLT 379 367 337 332  MCV 91.3 90.8 93.5 94.3  MCH 29.2 29.4 29.2 29.5  MCHC 32.0 32.4 31.2 31.3  RDW 13.2 13.2 13.1 13.2  LYMPHSABS 1.6  --   --   --   MONOABS 1.1*  --   --   --   EOSABS 0.2  --   --   --   BASOSABS 0.1  --   --   --     Recent Labs  Lab 04/28/23 1840 04/29/23 0700 04/30/23 0506 05/01/23 0511  NA 135 137 136 135  K 4.0 3.9 4.0 4.0  CL 100 101 100 100  CO2 24 27 27 27   GLUCOSE 135* 132* 131* 112*  BUN 14 15 20 15   CREATININE 0.75 0.85 1.00 0.88  CALCIUM 8.8* 8.9 8.5* 8.3*  AST 18  --  17 14*  ALT 17  --  16 14  ALKPHOS 82  --  74 72  BILITOT 0.5  --  0.5 0.4  ALBUMIN 3.5  --  3.0* 2.9*  MG  --   --  2.1 1.9  BNP 45.4  --   --   --     ------------------------------------------------------------------------------------------------------------------ No results for input(s): "CHOL", "HDL", "LDLCALC", "TRIG", "CHOLHDL", "LDLDIRECT" in the last 72 hours.  Lab Results  Component Value Date   HGBA1C 6.7 (H) 03/18/2023   ------------------------------------------------------------------------------------------------------------------ No results for input(s): "TSH", "T4TOTAL", "T3FREE", "THYROIDAB" in the last 72 hours.  Invalid input(s): "FREET3"  Cardiac Enzymes No results for input(s): "CKMB", "TROPONINI", "MYOGLOBIN" in the last 168 hours.  Invalid input(s):  "CK" ------------------------------------------------------------------------------------------------------------------    Component Value Date/Time   BNP 45.4 04/28/2023 1840    CBG: Recent Labs  Lab 05/01/23 1200 05/01/23  1616 05/01/23 2111 05/02/23 0727 05/02/23 1138  GLUCAP 95 143* 120* 126* 115*    Recent Results (from the past 240 hours)  SARS Coronavirus 2 by RT PCR (hospital order, performed in [facility] hospital lab) *cepheid single result test* Anterior Nasal Swab     Status: None   Collection Time: 04/27/23  2:07 PM   Specimen: Anterior Nasal Swab  Result Value Ref Range Status   SARS Coronavirus 2 by RT PCR NEGATIVE NEGATIVE Final    Comment: (NOTE) SARS-CoV-2 target nucleic acids are NOT DETECTED.  The SARS-CoV-2 RNA is generally detectable in upper and lower respiratory specimens during the acute phase of infection. The lowest concentration of SARS-CoV-2 viral copies this assay can detect is 250 copies / mL. A negative result does not preclude SARS-CoV-2 infection and should not be used as the sole basis for treatment or other patient management decisions.  A negative result may occur with improper specimen collection / handling, submission of specimen other than nasopharyngeal swab, presence of viral mutation(s) within the areas targeted by this assay, and inadequate number of viral copies (<250 copies / mL). A negative result must be combined with clinical observations, patient history, and epidemiological information.  Fact Sheet for Patients:   RoadLapTop.co.za  Fact Sheet for Healthcare Providers: http://kim-miller.com/  This test is not yet approved or  cleared by the [place] FDA and has been authorized for detection and/or diagnosis of SARS-CoV-2 by FDA under an Emergency Use Authorization (EUA).  This EUA will remain in effect (meaning this test can be used) for the duration of the COVID-19  declaration under Section 564(b)(1) of the Act, 21 U.S.C. section 360bbb-3(b)(1), unless the authorization is terminated or revoked sooner.  Performed at [facility], [address], [place], [place] [address]   Resp panel by RT-PCR (RSV, Flu A&B, Covid) Anterior Nasal Swab     Status: None   Collection Time: 04/28/23  9:41 PM   Specimen: Anterior Nasal Swab  Result Value Ref Range Status   SARS Coronavirus 2 by RT PCR NEGATIVE NEGATIVE Final    Comment: (NOTE) SARS-CoV-2 target nucleic acids are NOT DETECTED.  The SARS-CoV-2 RNA is generally detectable in upper respiratory specimens during the acute phase of infection. The lowest concentration of SARS-CoV-2 viral copies this assay can detect is 138 copies/mL. A negative result does not preclude SARS-Cov-2 infection and should not be used as the sole basis for treatment or other patient management decisions. A negative result may occur with  improper specimen collection/handling, submission of specimen other than nasopharyngeal swab, presence of viral mutation(s) within the areas targeted by this assay, and inadequate number of viral copies(<138 copies/mL). A negative result must be combined with clinical observations, patient history, and epidemiological information. The expected result is Negative.  Fact Sheet for Patients:  BloggerCourse.com  Fact Sheet for Healthcare Providers:  SeriousBroker.it  This test is no t yet approved or cleared by the [place] FDA and  has been authorized for detection and/or diagnosis of SARS-CoV-2 by FDA under an Emergency Use Authorization (EUA). This EUA will remain  in effect (meaning this test can be used) for the duration of the COVID-19 declaration under Section 564(b)(1) of the Act, 21 U.S.C.section 360bbb-3(b)(1), unless the authorization is terminated  or revoked sooner.       Influenza A by PCR NEGATIVE  NEGATIVE Final   Influenza B by PCR NEGATIVE NEGATIVE Final    Comment: (NOTE) The Xpert Xpress SARS-CoV-2/FLU/RSV plus  assay is intended as an aid in the diagnosis of influenza from Nasopharyngeal swab specimens and should not be used as a sole basis for treatment. Nasal washings and aspirates are unacceptable for Xpert Xpress SARS-CoV-2/FLU/RSV testing.  Fact Sheet for Patients: BloggerCourse.com  Fact Sheet for Healthcare Providers: SeriousBroker.it  This test is not yet approved or cleared by the [place] FDA and has been authorized for detection and/or diagnosis of SARS-CoV-2 by FDA under an Emergency Use Authorization (EUA). This EUA will remain in effect (meaning this test can be used) for the duration of the COVID-19 declaration under Section 564(b)(1) of the Act, 21 U.S.C. section 360bbb-3(b)(1), unless the authorization is terminated or revoked.     Resp Syncytial Virus by PCR NEGATIVE NEGATIVE Final    Comment: (NOTE) Fact Sheet for Patients: BloggerCourse.com  Fact Sheet for Healthcare Providers: SeriousBroker.it  This test is not yet approved or cleared by the [place] FDA and has been authorized for detection and/or diagnosis of SARS-CoV-2 by FDA under an Emergency Use Authorization (EUA). This EUA will remain in effect (meaning this test can be used) for the duration of the COVID-19 declaration under Section 564(b)(1) of the Act, 21 U.S.C. section 360bbb-3(b)(1), unless the authorization is terminated or revoked.  Performed at [facility], [address], [place], [place] [address]      Radiology Studies: No results found.    Pamella Pert, MD, PhD [facility]  Between 7 am - 7 pm I am available, please contact me via Amion (for emergencies) or Securechat (non urgent messages)  Between 7 pm - 7 am I am not  available, please contact night coverage MD/APP via Amion

## 2023-05-02 NOTE — Plan of Care (Signed)
  Problem: Nutritional: Goal: Maintenance of adequate nutrition will improve Outcome: Completed/Met   Problem: Clinical Measurements: Goal: Diagnostic test results will improve Outcome: Progressing   Problem: Activity: Goal: Risk for activity intolerance will decrease Outcome: Adequate for Discharge   Problem: Nutrition: Goal: Adequate nutrition will be maintained Outcome: Completed/Met

## 2023-05-02 NOTE — Progress Notes (Signed)
   05/02/23 2359  BiPAP/CPAP/SIPAP  BiPAP/CPAP/SIPAP Pt Type Adult  Reason BIPAP/CPAP not in use Non-compliant

## 2023-05-02 NOTE — Progress Notes (Signed)
   Palliative Medicine Inpatient Follow Up Note HPI: 73 year old male with advanced COPD with chronic hypoxic respiratory failure on 4 to 5 L of oxygen at home, history of PE now off anticoagulation due to hemoptysis and epistaxis. The Palliative care team has been asked to get involved to address goals of care given recurrent hospitalizations.   Today's Discussion 05/02/2023  *Please note that this is a verbal dictation therefore any spelling or grammatical errors are due to the "Dragon Medical One" system interpretation.  Chart reviewed inclusive of vital signs, progress notes, laboratory results, and diagnostic images.   I met with Mario [name] this morning. He is awake and alert. He shares that he just got back from the restroom and is feeling "winded". Allowed him time to recover.   Mario [name] shares that he remains to have ongoing cough and headache.   Mario [name] is aware of the plan for the Chaplain to support completion of AD's.   Questions and concerns addressed/Palliative Support Provided.   Objective Assessment: Vital Signs Vitals:   05/01/23 2109 05/02/23 0442  BP: (!) 161/87 (!) 147/72  Pulse: 97 82  Resp: 18 18  Temp: 98.4 F (36.9 C) 97.7 F (36.5 C)  SpO2: 94% 92%    Intake/Output Summary (Last 24 hours) at 05/02/2023 1039 Last data filed at 05/02/2023 0932 Gross per 24 hour  Intake 2260 ml  Output 1975 ml  Net 285 ml   Last Weight  Most recent update: 05/02/2023  4:45 AM    Weight  135.5 kg (298 lb 11.6 oz)            Gen:  Elderly Caucasian M chronically ill-appearing HEENT: moist mucous membranes CV: Irregular rate and irregular rhythm PULM: On 4 L nasal cannula breathing is even and not labored able to complete sentences ABD: soft/nontender EXT: No edema Neuro: Alert and oriented x3  SUMMARY OF RECOMMENDATIONS   Full code/full scope of care --> Hard choices booklet provided   ADs completed --> Appreciate Chaplain support for notarization   Appreciate TOC  helping with long term housing options on first floor --> (+) [employer] benefits but only 30% service connected   Ongoing palliative care support  Billing based on MDM:  Low ______________________________________________________________________________________ Mario [name] Palliative Medicine Team Team Cell Phone: [phone] Please utilize secure chat with additional questions, if there is no response within 30 minutes please call the above phone number  Palliative Medicine Team providers are available by phone from 7am to 7pm daily and can be reached through the team cell phone.  Should this patient require assistance outside of these hours, please call the patient's attending physician.

## 2023-05-02 NOTE — Progress Notes (Addendum)
   05/02/23 1614  Spiritual Encounters  Type of Visit Initial  Care provided to: Patient  Reason for visit Routine spiritual support  OnCall Visit No   Visited with 73 year old male concerned about the onset of headaches which keep him awake at night. Patient also experiencing a cough which he had thought went away. Patient trying to find new housing as he is having difficulty climbing stair where he lives. Had long visit talking with patient about recent changes in his life and possible plans for the future. Provided spiritual care through reflective listening and compassionate presence as patient has no relatives in [place].   Patient has forms for Advance Directive as he is interested in having a HCPOA appointed. Will need follow-up visit.

## 2023-05-02 NOTE — Evaluation (Signed)
 Occupational Therapy Evaluation Patient Details Name: Mario [name] MRN: [id number] DOB: Dec 05, 1950 Today's Date: 05/02/2023   History of Present Illness   Pt is a 73 y.o. male presenting with increased shortness of breath and low O2 saturation after having d/c earlier same day from hospital. PMH: non-small cell lung cancer status post SBRT, vasculitis and ankylosing spondylitis, PE on Eliquis, OSA with CPAP intolerance, type 2 diabetes mellitus, chronic pain, depression, anxiety, and COPD with chronic hypoxic respiratory failure .     Clinical Impressions Patient evaluated by Occupational Therapy with no further acute OT needs identified. All education has been completed and the patient has no further questions. Patient was educated on using ice and continuing to use RUE for functional tasks as patient was doing. Patient declined to use ice.  See below for any follow-up Occupational Therapy or equipment needs. OT is signing off. Thank you for this referral.      If plan is discharge home, recommend the following:   Assistance with cooking/housework;Assist for transportation;Help with stairs or ramp for entrance;Direct supervision/assist for medications management     Functional Status Assessment   Patient has not had a recent decline in their functional status     Equipment Recommendations   None recommended by OT      Precautions/Restrictions   Precautions Precautions: Fall Precaution/Restrictions Comments: monitor O2 and HR/5L at baseline Restrictions Weight Bearing Restrictions Per Provider Order: No            ADL either performed or assessed with clinical judgement       Pertinent Vitals/Pain Pain Assessment Pain Assessment: Faces Faces Pain Scale: Hurts whole lot Pain Location: R shoulder Pain Descriptors / Indicators: Constant Pain Intervention(s): Monitored during session     Extremity/Trunk Assessment Upper Extremity Assessment Upper  Extremity Assessment: Overall WFL for tasks assessed;RUE deficits/detail;LUE deficits/detail (patient is able to range BUE over 130 degrees for FF) RUE Deficits / Details: reported having RCT issues prior level. patient indicated pain with movement and did have audible 'pop' noise while therapist was in room but no movement was noted at that time. patient was able to FF past 120 degrees and ABDuct shoulder over 90 degrees. patient elbow hand and wrist WFL LUE Deficits / Details: WFL reported no issues able to do whindmill movement with arm.              Cognition Arousal: Alert Behavior During Therapy: WFL for tasks assessed/performed               OT - Cognition Comments: patient has poor insight to deficits.                                    Home Living Family/patient expects to be discharged to:: Private residence Living Arrangements: Non-relatives/Friends (roommate) Available Help at Discharge: Friend(s) Type of Home: House Home Access: Level entry     Home Layout: Two level Alternate Level Stairs-Number of Steps: 5 Alternate Level Stairs-Rails: Left Bathroom Shower/Tub: Tub/shower unit         Home Equipment: Cane - single point;Shower Counsellor (2 wheels);Rollator (4 wheels);Other (comment);Hand held shower head   Additional Comments: pt reports he rents a room in a home with a few other roommates; goes up/down 5 steps to kitchen/living room area. bedroom on 2nd level of 5 steps. wears 5L O2 at baseline. Pt has a pulse oximeter at home  Prior Functioning/Environment Prior Level of Function : Independent/Modified Independent             Mobility Comments: "I don't walk much". When he walks he does not use an AD. ADLs Comments: senior living companion assists with laundry, meals. cleaning    OT Problem List: Pain   OT Treatment/Interventions:        OT Goals(Current goals can be found in the care plan section)   Acute  Rehab OT Goals OT Goal Formulation: All assessment and education complete, DC therapy   OT Frequency:          AM-PAC OT "6 Clicks" Daily Activity     Outcome Measure Help from another person eating meals?: None Help from another person taking care of personal grooming?: None Help from another person toileting, which includes using toliet, bedpan, or urinal?: None Help from another person bathing (including washing, rinsing, drying)?: None Help from another person to put on and taking off regular upper body clothing?: None Help from another person to put on and taking off regular lower body clothing?: None 6 Click Score: 24   End of Session Equipment Utilized During Treatment: Oxygen Nurse Communication: Other (comment) (ok to see patient)  Activity Tolerance: Patient tolerated treatment well Patient left: in bed;with call bell/phone within reach  OT Visit Diagnosis: Pain Pain - Right/Left: Right Pain - part of body: Shoulder                Time: 1610-9604 OT Time Calculation (min): 10 min Charges:  OT General Charges $OT Visit: 1 Visit OT Evaluation $OT Eval Low Complexity: 1 Low  Khaleel Beckom OTR/L, MS Acute Rehabilitation Department Office# [phone]   Selinda Flavin 05/02/2023, 3:56 PM

## 2023-05-02 NOTE — TOC Progression Note (Addendum)
 Transition of Care [facility]) - Progression Note    Patient Details  Name: Mario [name] MRN: [id number] Date of Birth: October 04, 1950  Transition of Care [facility]) CM/SW Contact  Howell Rucks, RN Phone Number: 05/02/2023, 12:54 PM  Clinical Narrative: Voicemail received from [facility] w/  [employer] 05/02/23 at 9:11am, contact : [phone], reports pt is on service with [employer] for [facility] services through his [place], 15 hr/week ( assist with ADL's, bathing, meal prep, grooming, dressing, chores, companionship), states patient may be eligible for more services. Contact [facility] at [phone], ext [phone].  -1:08pm Call to [place], [place] at [phone] ext [phone], left vm with NCM name and phone number requesting call back to discuss additional eligible home services for patient, await call back.     Expected Discharge Plan: Home w Home Health Services Barriers to Discharge: Continued Medical Work up  Expected Discharge Plan and Services   Discharge Planning Services: CM Consult   Living arrangements for the past 2 months: Single Family Home                                       Social Determinants of Health (SDOH) Interventions SDOH Screenings   Food Insecurity: No Food Insecurity (04/30/2023)  Housing: Low Risk  (04/30/2023)  Transportation Needs: No Transportation Needs (04/30/2023)  Utilities: Not At Risk (04/30/2023)  Financial Resource Strain: Low Risk  (09/23/2020)  Social Connections: Socially Isolated (04/29/2023)  Stress: No Stress Concern Present (09/23/2020)  Tobacco Use: Medium Risk (04/24/2023)    Readmission Risk Interventions    04/24/2023   10:09 AM 03/25/2023    2:23 PM 11/03/2021   11:50 AM  Readmission Risk Prevention Plan  Transportation Screening Complete Complete Complete  Medication Review Oceanographer) Complete Complete Complete  PCP or Specialist appointment within 3-5 days of discharge   Complete  HRI or Home Care Consult Complete  Complete Complete  SW Recovery Care/Counseling Consult Complete Complete Complete  Palliative Care Screening Not Applicable Not Applicable Not Applicable  Skilled Nursing Facility Not Applicable Not Applicable Not Applicable

## 2023-05-03 DIAGNOSIS — C3492 Malignant neoplasm of unspecified part of left bronchus or lung: Secondary | ICD-10-CM

## 2023-05-03 DIAGNOSIS — J189 Pneumonia, unspecified organism: Secondary | ICD-10-CM | POA: Diagnosis not present

## 2023-05-03 DIAGNOSIS — J9621 Acute and chronic respiratory failure with hypoxia: Secondary | ICD-10-CM | POA: Diagnosis not present

## 2023-05-03 DIAGNOSIS — I10 Essential (primary) hypertension: Secondary | ICD-10-CM

## 2023-05-03 DIAGNOSIS — Z7189 Other specified counseling: Secondary | ICD-10-CM

## 2023-05-03 DIAGNOSIS — G4733 Obstructive sleep apnea (adult) (pediatric): Secondary | ICD-10-CM

## 2023-05-03 LAB — PHOSPHORUS: Phosphorus: 3.6 mg/dL (ref 2.5–4.6)

## 2023-05-03 LAB — PROCALCITONIN: Procalcitonin: <0.1 ng/mL

## 2023-05-03 LAB — COMPREHENSIVE METABOLIC PANEL
ALT: 13 U/L (ref 0–44)
AST: 15 U/L (ref 15–41)
Albumin: 3.5 g/dL (ref 3.5–5.0)
Alkaline Phosphatase: 83 U/L (ref 38–126)
Anion gap: 9 (ref 5–15)
BUN: 13 mg/dL (ref 8–23)
CO2: 25 mmol/L (ref 22–32)
Calcium: 8.6 mg/dL — ABNORMAL LOW (ref 8.9–10.3)
Chloride: 99 mmol/L (ref 98–111)
Creatinine, Ser: 0.79 mg/dL (ref 0.61–1.24)
GFR, Estimated: >60 mL/min (ref >60)
Glucose, Bld: 128 mg/dL — ABNORMAL HIGH (ref 70–99)
Potassium: 3.6 mmol/L (ref 3.5–5.1)
Sodium: 133 mmol/L — ABNORMAL LOW (ref 135–145)
Total Bilirubin: 0.3 mg/dL (ref 0.0–1.2)
Total Protein: 7 g/dL (ref 6.5–8.1)

## 2023-05-03 LAB — C-REACTIVE PROTEIN: CRP: 5.1 mg/dL — ABNORMAL HIGH (ref <1.0)

## 2023-05-03 LAB — CBC
HCT: 39.2 % (ref 39.0–52.0)
Hemoglobin: 12.5 g/dL — ABNORMAL LOW (ref 13.0–17.0)
MCH: 29.4 pg (ref 26.0–34.0)
MCHC: 31.9 g/dL (ref 30.0–36.0)
MCV: 92.2 fL (ref 80.0–100.0)
Platelets: 410 10*3/uL — ABNORMAL HIGH (ref 150–400)
RBC: 4.25 MIL/uL (ref 4.22–5.81)
RDW: 13 % (ref 11.5–15.5)
WBC: 9.2 10*3/uL (ref 4.0–10.5)
nRBC: 0 % (ref 0.0–0.2)

## 2023-05-03 LAB — GLUCOSE, CAPILLARY
Glucose-Capillary: 116 mg/dL — ABNORMAL HIGH (ref 70–99)
Glucose-Capillary: 132 mg/dL — ABNORMAL HIGH (ref 70–99)
Glucose-Capillary: 129 mg/dL — ABNORMAL HIGH (ref 70–99)
Glucose-Capillary: 150 mg/dL — ABNORMAL HIGH (ref 70–99)

## 2023-05-03 LAB — MAGNESIUM: Magnesium: 2.1 mg/dL (ref 1.7–2.4)

## 2023-05-03 MED ORDER — BUTALBITAL-APAP-CAFFEINE 50-325-40 MG PO TABS
1.0000 | ORAL_TABLET | Freq: Once | ORAL | Status: AC
Start: 2023-05-03 — End: 2023-05-03
  Administered 2023-05-03: 1 via ORAL
  Filled 2023-05-03: qty 1

## 2023-05-03 MED ORDER — SALINE SPRAY 0.65 % NA SOLN
1.0000 | NASAL | Status: DC | PRN
  Filled 2023-05-03: qty 44

## 2023-05-03 MED ORDER — INSULIN ASPART 100 UNIT/ML IJ SOLN
0.0000 [IU] | Freq: Three times a day (TID) | INTRAMUSCULAR | Status: DC
  Administered 2023-05-03: 1 [IU] via SUBCUTANEOUS
  Administered 2023-05-04 (×2): 2 [IU] via SUBCUTANEOUS
  Administered 2023-05-04 (×2): 1 [IU] via SUBCUTANEOUS
  Administered 2023-05-05: 2 [IU] via SUBCUTANEOUS

## 2023-05-03 NOTE — TOC Progression Note (Signed)
 Transition of Care [facility]) - Progression Note    Patient Details  Name: Mario [name] MRN: [id number] Date of Birth: 22-Jun-1950  Transition of Care [facility]) CM/SW Contact  Howell Rucks, RN Phone Number: 05/03/2023, 2:21 PM  Clinical Narrative:  LATE ENTRY, return call received from [place], [place] 05/02/2023 at 3:54pm,  reports patient my be eligible for additional home services through the [place], reports pt up for review of continued and possible new home services or 05/29/23.     Expected Discharge Plan: Home w Home Health Services Barriers to Discharge: Continued Medical Work up  Expected Discharge Plan and Services   Discharge Planning Services: CM Consult   Living arrangements for the past 2 months: Single Family Home                                       Social Determinants of Health (SDOH) Interventions SDOH Screenings   Food Insecurity: No Food Insecurity (04/30/2023)  Housing: Low Risk  (04/30/2023)  Transportation Needs: No Transportation Needs (04/30/2023)  Utilities: Not At Risk (04/30/2023)  Financial Resource Strain: Low Risk  (09/23/2020)  Social Connections: Socially Isolated (04/29/2023)  Stress: No Stress Concern Present (09/23/2020)  Tobacco Use: Medium Risk (05/02/2023)    Readmission Risk Interventions    04/24/2023   10:09 AM 03/25/2023    2:23 PM 11/03/2021   11:50 AM  Readmission Risk Prevention Plan  Transportation Screening Complete Complete Complete  Medication Review Oceanographer) Complete Complete Complete  PCP or Specialist appointment within 3-5 days of discharge   Complete  HRI or Home Care Consult Complete Complete Complete  SW Recovery Care/Counseling Consult Complete Complete Complete  Palliative Care Screening Not Applicable Not Applicable Not Applicable  Skilled Nursing Facility Not Applicable Not Applicable Not Applicable

## 2023-05-03 NOTE — Assessment & Plan Note (Signed)
 Presence of advanced COPD complicates patient's presentation

## 2023-05-03 NOTE — Progress Notes (Signed)
 PROGRESS NOTE   Mario SHAMMAS  YQM:[id number] DOB: [birth year] DOA: 04/28/2023 PCP: [name]   Date of Service: the patient was seen and examined on 05/03/2023  Brief Narrative:  73 year old male with advanced COPD with known history of adenocarcinoma of the lungs (reconfirmed on 2/17 via bronchoscopy), chronic hypoxic respiratory failure on 4 to 5 L of oxygen at home, history of PE now off anticoagulation due to hemoptysis and epistaxis.  Presenting [facility] with shortness of breath thought to be suffering from COPD exacerbation with right lower lobe pneumonia.    Of note, patient has had multiple recent hospitalizations.  Patient was admitted from 1/17 until 2/1 and then was readmitted the same day on 2/1 and remained hospitalized till 2/24.  Patient then returned to [facility] once again on the same day and was hospitalized from 2/24 until 2/28.  Once again he returned on the same day of discharge with reports of hypoxia at home.  During this presentation, patient has been managed with intravenous antibiotics and bronchodilator therapy.  There is suspicion that patient has a right lower lobe pneumonia with superimposed progressive lung malignancy.   Assessment & Plan Right lower lobe pneumonia CT imaging on admission revealing increased consolidation and groundglass opacities in the right lower lobe Day 4 of intravenous antibiotics Continuing bronchodilator therapy Continuing supplemental oxygen although patient continues to exhibit hypoxia with exertion  Acute on chronic hypoxic respiratory failure (HCC) Thought to be secondary to pneumonia likely in combination with progressive pulmonary malignancy and advanced COPD Unclear as to what patient's current baseline is although I think patient's long-term pulmonary status is worsening Manage with antibiotics and bronchodilator therapy as mentioned above Will need to reassess oxygen requirements prior to  eventual disposition Goals of care, counseling/discussion Lengthy discussion with the patient surrounding his slowly worsening lung disease and guarded prognosis Patient continues to wish to receive all modalities of care and wishes to remain full code Palliative care has been consulted and has been assisting in having these discussions with the patient, their input is appreciated.  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 BAL performed 2/17 confirmatory of ongoing adenocarcinoma of the lungs Patient follows with oncology at the [facility], not actively receiving treatment Essential hypertension Continue diltiazem BPH (benign prostatic hyperplasia) Continue Flomax    Subjective:  Patient continuing to complain of shortness of breath, moderate in intensity, worse with exertion and improved with rest.  Overall patient states that he has improved since arrival but continues to report that he does not believe that he is approaching baseline.  Physical Exam:  Vitals:   05/03/23 0856 05/03/23 0931 05/03/23 1349 05/03/23 2158  BP:   (!) 153/86 (!) 141/74  Pulse:   94 91  Resp:   18 16  Temp:   97.8 F (36.6 C) 97.9 F (36.6 C)  TempSrc:   Oral Oral  SpO2: 92% (!) 83% 94% 94%  Weight:      Height:        Constitutional: Awake alert and oriented x3, no associated distress.   Skin: no rashes, no lesions, good skin turgor noted. Eyes: Pupils are equally reactive to  light.  No evidence of scleral icterus or conjunctival pallor.  ENMT: Moist mucous membranes noted.  Posterior pharynx clear of any exudate or  lesions.   Respiratory: Mild basilar rales with intermittent mild expiratory wheezing.  Normal respiratory effort. No accessory muscle use.  Cardiovascular: Regular rate and rhythm, no murmurs / rubs / gallops. No extremity edema. 2+ pedal pulses. No carotid bruits.  Abdomen: Abdomen is soft and nontender.  No evidence of intra-abdominal masses.  Positive bowel sounds noted in all quadrants.   Musculoskeletal: No joint deformity upper and lower extremities. Good ROM, no contractures. Normal muscle tone.    Data Reviewed:  I have personally reviewed and interpreted labs, imaging.  Significant findings are   CBC: Recent Labs  Lab 04/28/23 1840 04/29/23 0700 04/30/23 0506 05/01/23 0511 05/03/23 0522  WBC 10.7* 9.4 8.6 8.5 9.2  NEUTROABS 7.8*  --   --   --   --   HGB 12.7* 12.8* 11.7* 11.3* 12.5*  HCT 39.7 39.5 37.5* 36.1* 39.2  MCV 91.3 90.8 93.5 94.3 92.2  PLT 379 367 337 332 410*   Basic Metabolic Panel: Recent Labs  Lab 04/28/23 1840 04/29/23 0700 04/30/23 0506 05/01/23 0511 05/03/23 0522  NA 135 137 136 135 133*  K 4.0 3.9 4.0 4.0 3.6  CL 100 101 100 100 99  CO2 24 27 27 27 25   GLUCOSE 135* 132* 131* 112* 128*  BUN 14 15 20 15 13   CREATININE 0.75 0.85 1.00 0.88 0.79  CALCIUM 8.8* 8.9 8.5* 8.3* 8.6*  MG  --   --  2.1 1.9 2.1  PHOS  --   --   --   --  3.6   GFR: Estimated Creatinine Clearance: 124.1 mL/min (by C-G formula based on SCr of 0.79 mg/dL). Liver Function Tests: Recent Labs  Lab 04/28/23 1840 04/30/23 0506 05/01/23 0511 05/03/23 0522  AST 18 17 14* 15  ALT 17 16 14 13   ALKPHOS 82 74 72 83  BILITOT 0.5 0.5 0.4 0.3  PROT 7.1 6.4* 6.1* 7.0  ALBUMIN 3.5 3.0* 2.9* 3.5     Code Status:  Full code.  Code status decision has been confirmed with: patient    Severity of Illness:  The appropriate patient status for this patient is INPATIENT. Inpatient status is judged to be reasonable and necessary in order to provide the required intensity of  service to ensure the patient's safety. The patient's presenting symptoms, physical exam findings, and initial radiographic and laboratory data in the context of their chronic comorbidities is felt to place them at high risk for further clinical deterioration. Furthermore, it is not anticipated that the patient will be medically stable for discharge from the hospital within 2 midnights of admission.   * I certify that at the point of admission it is my clinical judgment that the patient will require inpatient hospital care spanning beyond 2 midnights from the point of admission due to high intensity of service, high risk for further deterioration and high frequency of surveillance required.*  Time spent:  49 minutes  Author:  Marinda Elk MD  05/03/2023 11:39 PM

## 2023-05-03 NOTE — Assessment & Plan Note (Signed)
 CT imaging on admission revealing increased consolidation and groundglass opacities in the right lower lobe Day 4 of intravenous antibiotics Continuing bronchodilator therapy Continuing supplemental oxygen although patient continues to exhibit hypoxia with exertion

## 2023-05-03 NOTE — Plan of Care (Signed)
  Problem: Education: Goal: Ability to describe self-care measures that may prevent or decrease complications (Diabetes Survival Skills Education) will improve Outcome: Progressing Goal: Individualized Educational Video(s) Outcome: Progressing   Problem: Coping: Goal: Ability to adjust to condition or change in health will improve Outcome: Progressing   Problem: Fluid Volume: Goal: Ability to maintain a balanced intake and output will improve Outcome: Progressing   Problem: Health Behavior/Discharge Planning: Goal: Ability to identify and utilize available resources and services will improve Outcome: Progressing Goal: Ability to manage health-related needs will improve Outcome: Progressing   Problem: Metabolic: Goal: Ability to maintain appropriate glucose levels will improve Outcome: Progressing   Problem: Nutritional: Goal: Progress toward achieving an optimal weight will improve Outcome: Progressing   Problem: Skin Integrity: Goal: Risk for impaired skin integrity will decrease Outcome: Progressing   Problem: Tissue Perfusion: Goal: Adequacy of tissue perfusion will improve Outcome: Progressing   Problem: Education: Goal: Knowledge of General Education information will improve Description: Including pain rating scale, medication(s)/side effects and non-pharmacologic comfort measures Outcome: Progressing   Problem: Health Behavior/Discharge Planning: Goal: Ability to manage health-related needs will improve Outcome: Progressing   Problem: Clinical Measurements: Goal: Ability to maintain clinical measurements within normal limits will improve Outcome: Progressing Goal: Will remain free from infection Outcome: Progressing Goal: Diagnostic test results will improve Outcome: Progressing Goal: Respiratory complications will improve Outcome: Progressing Goal: Cardiovascular complication will be avoided Outcome: Progressing   Problem: Activity: Goal: Risk for  activity intolerance will decrease Outcome: Progressing   Problem: Coping: Goal: Level of anxiety will decrease Outcome: Progressing   Problem: Elimination: Goal: Will not experience complications related to bowel motility Outcome: Progressing Goal: Will not experience complications related to urinary retention Outcome: Progressing   Problem: Pain Managment: Goal: General experience of comfort will improve and/or be controlled Outcome: Progressing   Problem: Safety: Goal: Ability to remain free from injury will improve Outcome: Progressing   Problem: Skin Integrity: Goal: Risk for impaired skin integrity will decrease Outcome: Progressing   Problem: Activity: Goal: Ability to tolerate increased activity will improve Outcome: Progressing   Problem: Clinical Measurements: Goal: Ability to maintain a body temperature in the normal range will improve Outcome: Progressing   Problem: Respiratory: Goal: Ability to maintain adequate ventilation will improve Outcome: Progressing Goal: Ability to maintain a clear airway will improve Outcome: Progressing

## 2023-05-03 NOTE — Progress Notes (Signed)
 Mobility Specialist - Progress Note   05/03/23 0931  Oxygen Therapy  SpO2 (!) 83 %  O2 Device Nasal Cannula  O2 Flow Rate (L/min) 4 L/min  Patient Activity (if Appropriate) Ambulating  Mobility  Activity Ambulated independently in hallway  Level of Assistance Independent  Assistive Device None  Distance Ambulated (ft) 80 ft  Activity Response Tolerated well  Mobility Referral Yes  Mobility visit 1 Mobility  Mobility Specialist Start Time (ACUTE ONLY) [id number]  Mobility Specialist Stop Time (ACUTE ONLY) 0930  Mobility Specialist Time Calculation (min) (ACUTE ONLY) 9 min   Pt received EOB and agreeable to mobility. Upon returning to room, pt desat to 83%. Encouraged pursed lip breaths bringing SpO2 to 90% in ~78min. No complaints during session. Pt to EOB after session with all needs met.    Pre-mobility:  92% SpO2 (4L [place]) During mobility: 117 HR, 83% SpO2 (4L [place]) Post-mobility: 107 HR, 90% SPO2 (4L [place])  Chief Technology Officer

## 2023-05-03 NOTE — Assessment & Plan Note (Signed)
 -  Continue Flomax

## 2023-05-03 NOTE — Progress Notes (Addendum)
 Palliative Medicine Inpatient Follow Up Note HPI: 73 year old male with advanced COPD with chronic hypoxic respiratory failure on 4 to 5 L of oxygen at home, history of PE now off anticoagulation due to hemoptysis and epistaxis. The Palliative care team has been asked to get involved to address goals of care given recurrent hospitalizations.   Today's Discussion 05/03/2023  *Please note that this is a verbal dictation therefore any spelling or grammatical errors are due to the "[employer]agon [employer] One" system interpretation.  Chart reviewed inclusive of vital signs, progress notes, laboratory results, and diagnostic images.   I met with Mario [name] this afternoon. He was meeting with Mario [name]. It was explained to him by the doctor that there are concerns about patients current status given his recurrent re-hospitalizations. It was shared that we are likely at a point where patient has a new baseline. He stated that medical care will continue though everything that can be done for patient has been and we may be approaching an important time where additional decisions should be made moving into the future.  The topic of hospice was approach. Mario [name] shares he does not want hospice. Mario [name] explained his imaging finding as well as recent cytology results showing cancer cells. He shared it would not be surprising if patients current status is in the setting of worsening lung cancer or worsening disease. He encouraged Mario [name] to consulted next steps.   I was able to speak to Mario [name] after Mario [name] left. We discussed the reality of patients condition. We reviewed the chronic disease trajectory in patients who have multiple co-morbidities. I shared that often an event will occur leading to an  acute hospitalizationsuch as a fall, UTI, PNA, heart failure exacerbation, copd exacerbation, or another illness sort. We discussed that patients may have been functioning at a high plateau initially, then an acute  event occurs. We discussed that after this event their function, mental, and nutritional states are compromised. Often with treatment and rehabilitation there is some regain in each individuals health, though often not to their prior baseline level. We discussed that then another event will occur causing a rehospitalization and a further decline. I shared that often this will become a pattern and each event causes greater burden to the individual, depleting them further or their function, cognition, or nutritional state.   I shared that I would strongly encourage Mario [name] to consider a DNAR/DNI as I worry we will likely cause him more harm than benefit pursuing CPR, shocks, and/or intubation.   We discussed the option of the [name] which allows for longer term stays (up to three months) as it is likely he would be kept more comfortable in such an environment and it would inhibit the constant back and forth to the hospital. He plans to speak to his Mario [name].   In the meanwhile, Mario [name] does agree to OP Palliative support.   Questions and concerns addressed/Palliative Support Provided.   Objective Assessment: Vital Signs Vitals:   05/03/23 0931 05/03/23 1349  BP:  (!) 153/86  Pulse:  94  Resp:  18  Temp:  97.8 F (36.6 C)  SpO2: (!) 83% 94%    Intake/Output Summary (Last 24 hours) at 05/03/2023 1637 Last data filed at 05/03/2023 1400 Gross per 24 hour  Intake 1440 ml  Output 2800 ml  Net -1360 ml   Last Weight  Most recent update: 05/03/2023  5:39 AM    Weight  135.6 kg (298 lb 15.1 oz)  Gen:  Elderly Caucasian M chronically ill-appearing HEENT: moist mucous membranes CV: Irregular rate and irregular rhythm PULM: On 4 L nasal cannula breathing is even and not labored able to complete sentences ABD: soft/nontender EXT: No edema Neuro: Alert and oriented x3  SUMMARY OF RECOMMENDATIONS   Full code/full scope of care --> Hard choices booklet provided   ADs  completed --> Appreciate Chaplain support for notarization   Appreciate TOC helping with long term housing options on first floor --> (+) Mario benefits but only 30% service connected. Patient may be a good candidate for hospice placement at the [place] in [place] or [place] right now he is not amenable though certainly something to consider for the future.  OP Palliative support on discharge   Ongoing palliative care support  Time: 29 ______________________________________________________________________________________ Mario [name] [place] Palliative Medicine Team Team Cell Phone: [phone] Please utilize secure chat with additional questions, if there is no response within 30 minutes please call the above phone number  Palliative Medicine Team providers are available by phone from 7am to 7pm daily and can be reached through the team cell phone.  Should this patient require assistance outside of these hours, please call the patient's attending physician.

## 2023-05-03 NOTE — Assessment & Plan Note (Signed)
 BAL performed 2/17 confirmatory of ongoing adenocarcinoma of the lungs Patient follows with oncology at the [facility], not actively receiving treatment

## 2023-05-03 NOTE — Progress Notes (Signed)
   05/03/23 1411  Spiritual Encounters  Type of Visit Follow up  Care provided to: Patient  Reason for visit Advance directives  OnCall Visit No   Visited with patient to follow up on Advance Directive. Patient reported that he had not discussed with his daughter or his brother what his wishes would be. Patient did update form providing address and email for his daughter and email for his brother, however he "just didn't get to talking about it with them". While form is completed, there seems to be a hesitation.     Patient also reports his condition has not improved as he still has the headaches which are preventing him from sleeping and pneumonia which is affecting his breathing. Patient states that he tried to walk earlier but his oxygen levels continued to drop and he was unable to continue.

## 2023-05-03 NOTE — Assessment & Plan Note (Signed)
 Thought to be secondary to pneumonia likely in combination with progressive pulmonary malignancy and advanced COPD Unclear as to what patient's current baseline is although I think patient's long-term pulmonary status is worsening Manage with antibiotics and bronchodilator therapy as mentioned above Will need to reassess oxygen requirements prior to eventual disposition

## 2023-05-03 NOTE — Assessment & Plan Note (Signed)
 Lengthy discussion with the patient surrounding his slowly worsening lung disease and guarded prognosis Patient continues to wish to receive all modalities of care and wishes to remain full code Palliative care has been consulted and has been assisting in having these discussions with the patient, their input is appreciated.  They report the patient would be interested in some degree of outpatient palliative services, perhaps through the [place] or [facility] [place]

## 2023-05-03 NOTE — Evaluation (Signed)
 Physical Therapy Evaluation Patient Details Name: Mario [name] MRN: [id number] DOB: 1950-10-13 Today's Date: 05/03/2023  History of Present Illness  Pt is 73 yo male presented on 04/28/23 with shortness of breath. 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 Pt with several back to back hospitalizations this year for similar - discharges home then returns quickly with ongoing SOB.  Pt with chronic resp failure on 4-5 L O2 at home, advance COPD, hx of PE, hx of lung CA now new biopsy consistent with lung CA, OSA  Clinical Impression  Pt admitted with above diagnosis. At baseline, pt ambulates without AD and performs ADLs.  He is on home O2 and reports fatigues easily.  Pt with several recent admission due to shortness of breath.  Today, pt reports already ambulated in hallway and fatigued but did agree to ambulation in room.  Just with ambulating 30' on 5 L O2 sats decreased and took 3 mins to recover (see below).  Pt has good strength and balance - mainly limited by cardiorespiratory endurance.  Will try to progress as able, but unfortunately with pt's advance COPD and L lung adenocarcinoma progress may be limited. Does have several readmissions, but doesn't really have a skilled PT need for SNF as main limitation is cardiorespiratory endurance. Recommending HHPT and support at home.  Pt currently with functional limitations due to the deficits listed below (see PT Problem List). Pt will benefit from acute skilled PT to increase their independence and safety with mobility to allow discharge.       Pt on 5 L O2 with sats 93% . Ambulated short distance in room with sats 90% but dropping to 85% with recovery and requiring 3 mins to recover to 90 %       If plan is discharge home, recommend the following: Help with stairs or ramp for entrance;Assistance with cooking/housework;Assist for transportation   Can travel by private vehicle         Equipment Recommendations None recommended by PT  Recommendations for Other Services       Functional Status Assessment Patient has had a recent decline in their functional status and demonstrates the ability to make significant improvements in function in a reasonable and predictable amount of time.     Precautions / Restrictions Precautions Precautions: Fall Precaution/Restrictions Comments: Monitor O2      Mobility  Bed Mobility Overal bed mobility: Modified Independent             General bed mobility comments: sitting EOB at arrival    Transfers   Equipment used: None Transfers: Sit to/from Stand Sit to Stand: Supervision                Ambulation/Gait Ambulation/Gait assistance: Supervision Gait Distance (Feet): 30 Feet Assistive device: None Gait Pattern/deviations: Step-through pattern Gait velocity: functional     General Gait Details: Ambulated in room ;limited by SOB; ambulated in hallway earlier with mobility specialist  Stairs            Wheelchair Mobility     Tilt Bed    Modified Rankin (Stroke Patients Only)       Balance Overall balance assessment: Needs assistance Sitting-balance support: No upper extremity supported Sitting balance-Leahy Scale: Good     Standing balance support: No upper extremity supported Standing balance-Leahy Scale: Good  Pertinent Vitals/Pain Pain Assessment Pain Assessment: No/denies pain    Home Living Family/patient expects to be discharged to:: Private residence Living Arrangements: Non-relatives/Friends (roommate) Available Help at Discharge: Friend(s) Type of Home: House Home Access: Level entry     Alternate Level Stairs-Number of Steps: Split Level : enter on main level with kitchen/living  up 5 steps to bedroom and bathroom   Home Equipment: Cane - single point;Shower Counsellor (2 wheels);Rollator (4 wheels);Other  (comment);Hand held shower head Additional Comments: Wears 5 L O2 at baseline    Prior Function Prior Level of Function : Independent/Modified Independent             Mobility Comments: Ambulates in home; gets SOB easily ; walks without AD ADLs Comments: Independent with ADLs ; has assist with IADLs     Extremity/Trunk Assessment   Upper Extremity Assessment Upper Extremity Assessment: Overall WFL for tasks assessed    Lower Extremity Assessment Lower Extremity Assessment: Overall WFL for tasks assessed    Cervical / Trunk Assessment Cervical / Trunk Assessment: Normal  Communication        Cognition Arousal: Alert Behavior During Therapy: WFL for tasks assessed/performed   PT - Cognitive impairments: No apparent impairments                                 Cueing       General Comments General comments (skin integrity, edema, etc.): Pt on 5 L O2 with sats 93% .  Ambulated short distance in room with sats 90% but dropping to 85% with recovery and requiring 3 mins to recover to 90 %    Exercises Other Exercises Other Exercises: Performed IS correctly to 1500 mL   Assessment/Plan    PT Assessment Patient needs continued PT services  PT Problem List Cardiopulmonary status limiting activity;Decreased activity tolerance;Decreased mobility       PT Treatment Interventions Gait training;Functional mobility training;Therapeutic activities;Patient/family education;Stair training;DME instruction;Therapeutic exercise    PT Goals (Current goals can be found in the Care Plan section)  Acute Rehab PT Goals Patient Stated Goal: improve breathing PT Goal Formulation: With patient Time For Goal Achievement: 05/17/23 Potential to Achieve Goals: Fair    Frequency Min 2X/week     Co-evaluation               AM-PAC PT "6 Clicks" Mobility  Outcome Measure Help needed turning from your back to your side while in a flat bed without using bedrails?:  None Help needed moving from lying on your back to sitting on the side of a flat bed without using bedrails?: None Help needed moving to and from a bed to a chair (including a wheelchair)?: A Little Help needed standing up from a chair using your arms (e.g., wheelchair or bedside chair)?: A Little Help needed to walk in hospital room?: A Little Help needed climbing 3-5 steps with a railing? : A Little 6 Click Score: 20    End of Session Equipment Utilized During Treatment: Gait belt;Oxygen Activity Tolerance: Patient tolerated treatment well Patient left: in bed;with call bell/phone within reach Nurse Communication: Mobility status PT Visit Diagnosis: Other abnormalities of gait and mobility (R26.89)    Time: 0454-0981 PT Time Calculation (min) (ACUTE ONLY): 18 min   Charges:   PT Evaluation $PT Eval Low Complexity: 1 Low   PT General Charges $$ ACUTE PT VISIT: 1 Visit  Anise Salvo, PT Acute Rehab [facility] [phone]   Rayetta Humphrey 05/03/2023, 4:48 PM

## 2023-05-03 NOTE — Progress Notes (Signed)
 While ambulating @ 40 ft on 4L O2 patient desat, I asked patient to continue walking and allow me turn up the oxygen, patient refused and said he had to get back to his room. Upon sitting, pt dstat to 81%. Pt unwilling to ambulate further to see if increasing the O2 would help or not.

## 2023-05-03 NOTE — Assessment & Plan Note (Signed)
 Continue diltiazem.

## 2023-05-03 NOTE — Plan of Care (Signed)
   Problem: Education: Goal: Ability to describe self-care measures that may prevent or decrease complications (Diabetes Survival Skills Education) will improve Outcome: Progressing Goal: Individualized Educational Video(s) Outcome: Progressing   Problem: Coping: Goal: Ability to adjust to condition or change in health will improve Outcome: Progressing

## 2023-05-03 NOTE — Assessment & Plan Note (Signed)
 Continuing Accu-Cheks before every meal and nightly with sliding scale insulin

## 2023-05-04 DIAGNOSIS — C3492 Malignant neoplasm of unspecified part of left bronchus or lung: Secondary | ICD-10-CM | POA: Diagnosis not present

## 2023-05-04 DIAGNOSIS — I10 Essential (primary) hypertension: Secondary | ICD-10-CM | POA: Diagnosis not present

## 2023-05-04 DIAGNOSIS — J189 Pneumonia, unspecified organism: Secondary | ICD-10-CM | POA: Diagnosis not present

## 2023-05-04 DIAGNOSIS — J9621 Acute and chronic respiratory failure with hypoxia: Secondary | ICD-10-CM | POA: Diagnosis not present

## 2023-05-04 LAB — COMPREHENSIVE METABOLIC PANEL
ALT: 13 U/L (ref 0–44)
AST: 15 U/L (ref 15–41)
Albumin: 3 g/dL — ABNORMAL LOW (ref 3.5–5.0)
Alkaline Phosphatase: 72 U/L (ref 38–126)
Anion gap: 12 (ref 5–15)
BUN: 14 mg/dL (ref 8–23)
CO2: 24 mmol/L (ref 22–32)
Calcium: 8.7 mg/dL — ABNORMAL LOW (ref 8.9–10.3)
Chloride: 101 mmol/L (ref 98–111)
Creatinine, Ser: 0.75 mg/dL (ref 0.61–1.24)
GFR, Estimated: >60 mL/min (ref >60)
Glucose, Bld: 122 mg/dL — ABNORMAL HIGH (ref 70–99)
Potassium: 3.8 mmol/L (ref 3.5–5.1)
Sodium: 137 mmol/L (ref 135–145)
Total Bilirubin: 0.5 mg/dL (ref 0.0–1.2)
Total Protein: 6.1 g/dL — ABNORMAL LOW (ref 6.5–8.1)

## 2023-05-04 LAB — CBC WITH DIFFERENTIAL/PLATELET
Abs Immature Granulocytes: 0.03 10*3/uL (ref 0.00–0.07)
Basophils Absolute: 0.1 10*3/uL (ref 0.0–0.1)
Basophils Relative: 1 %
Eosinophils Absolute: 0.3 10*3/uL (ref 0.0–0.5)
Eosinophils Relative: 4 %
HCT: 38 % — ABNORMAL LOW (ref 39.0–52.0)
Hemoglobin: 11.9 g/dL — ABNORMAL LOW (ref 13.0–17.0)
Immature Granulocytes: 0 %
Lymphocytes Relative: 21 %
Lymphs Abs: 1.7 10*3/uL (ref 0.7–4.0)
MCH: 29.5 pg (ref 26.0–34.0)
MCHC: 31.3 g/dL (ref 30.0–36.0)
MCV: 94.3 fL (ref 80.0–100.0)
Monocytes Absolute: 1 10*3/uL (ref 0.1–1.0)
Monocytes Relative: 13 %
Neutro Abs: 4.8 10*3/uL (ref 1.7–7.7)
Neutrophils Relative %: 61 %
Platelets: 356 10*3/uL (ref 150–400)
RBC: 4.03 MIL/uL — ABNORMAL LOW (ref 4.22–5.81)
RDW: 13.1 % (ref 11.5–15.5)
WBC: 7.9 10*3/uL (ref 4.0–10.5)
nRBC: 0 % (ref 0.0–0.2)

## 2023-05-04 LAB — GLUCOSE, CAPILLARY
Glucose-Capillary: 136 mg/dL — ABNORMAL HIGH (ref 70–99)
Glucose-Capillary: 146 mg/dL — ABNORMAL HIGH (ref 70–99)
Glucose-Capillary: 173 mg/dL — ABNORMAL HIGH (ref 70–99)

## 2023-05-04 LAB — MAGNESIUM: Magnesium: 1.9 mg/dL (ref 1.7–2.4)

## 2023-05-04 MED ORDER — PREDNISONE 20 MG PO TABS
50.0000 mg | ORAL_TABLET | Freq: Every day | ORAL | Status: DC
  Administered 2023-05-05: 50 mg via ORAL
  Filled 2023-05-04: qty 2

## 2023-05-04 NOTE — Assessment & Plan Note (Addendum)
 CT imaging on admission revealing increased consolidation and groundglass opacities in the right lower lobe Day 5 of intravenous antibiotics today Continuing bronchodilator therapy Continuing supplemental oxygen although patient continues to exhibit hypoxia with exertion

## 2023-05-04 NOTE — Progress Notes (Signed)
 PROGRESS NOTE   Mario [name]  KGM:[id number] DOB: 04-30-1950 DOA: 04/28/2023 PCP: [name]   Date of Service: the patient was seen and examined on 05/04/2023  Brief Narrative:  73 year old male with advanced COPD with known history of adenocarcinoma of the lungs (reconfirmed on 2/17 via bronchoscopy), chronic hypoxic respiratory failure on 4 to 5 L of oxygen at home, history of PE now off anticoagulation due to hemoptysis and epistaxis.  Presenting [facility] with shortness of breath thought to be suffering from COPD exacerbation with right lower lobe pneumonia.    Of note, patient has had multiple recent hospitalizations.  Patient was admitted from 1/17 until 2/1 and then was readmitted the same day on 2/1 and remained hospitalized till 2/24.  Patient then returned to [facility] once again on the same day and was hospitalized from 2/24 until 2/28.  Once again he returned on the same day of discharge with reports of hypoxia at home.  During this presentation, patient has been managed with intravenous antibiotics and bronchodilator therapy.  There is suspicion that patient has a right lower lobe pneumonia with superimposed progressive lung malignancy.   Assessment & Plan Right lower lobe pneumonia CT imaging on admission revealing increased consolidation and groundglass opacities in the right lower lobe Day 5 of intravenous antibiotics today Continuing bronchodilator therapy Continuing supplemental oxygen although patient continues to exhibit hypoxia with exertion  Acute on chronic hypoxic respiratory failure (HCC) Thought to be secondary to pneumonia likely in combination with progressive pulmonary malignancy and advanced COPD Unclear as to what patient's current baseline is although I think patient's long-term pulmonary status is worsening Manage with antibiotics and bronchodilator therapy as mentioned above Will reassess home-going oxygen requirements  prior to discharge. Goals of care, counseling/discussion Lengthy discussion with the patient surrounding his slowly worsening lung disease and guarded prognosis Patient continues to wish to receive all modalities of care and wishes to remain full code While patient does not want hospice, he is interested in receiving some degree of outpatient palliative services. Case discussed with TOC and arrangements for home health and palliative services will be arranged at time of discharge. 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 BAL performed 2/17 confirmatory of ongoing adenocarcinoma of the lungs Patient follows with oncology at the [facility], not actively receiving treatment Essential hypertension Continue diltiazem BPH (benign prostatic hyperplasia) Continue Flomax    Subjective:  Patient continuing to complain of shortness of breath moderate at rest but severe with exertion.  Patient denies any associated chest pain.  Patient complaining of some associated cough.  Patient is that he is unsure as to whether he is improving compared to admission.  Physical Exam:  Vitals:   05/03/23 2158 05/04/23 0500 05/04/23 0557 05/04/23 0837  BP: (!) 141/74  (!) 145/79   Pulse: 91  88   Resp: 16  17   Temp: 97.9 F (36.6 C)  (!) 97.4 F (36.3 C)   TempSrc: Oral  Oral   SpO2: 94%  97% 97%  Weight:  134.8 kg    Height:        Constitutional: Awake alert and oriented x3, no associated distress.   Skin: no rashes, no lesions, good skin turgor noted. Eyes: Pupils are equally reactive to light.  No  evidence of scleral icterus or conjunctival pallor.  ENMT: Moist mucous membranes noted.  Posterior pharynx clear of any exudate or lesions.   Respiratory:  Mild basilar rales with intermittent mild expiratory wheezing.  Normal respiratory effort. No accessory muscle use.  Cardiovascular: Regular rate and rhythm, no murmurs / rubs / gallops. No extremity edema. 2+ pedal pulses. No carotid bruits.  Abdomen: Abdomen is soft and nontender.  No evidence of intra-abdominal masses.  Positive bowel sounds noted in all quadrants.   Musculoskeletal: No joint deformity upper and lower extremities. Good ROM, no contractures. Normal muscle tone.    Data Reviewed:  I have personally reviewed and interpreted labs, imaging.  Significant findings are   CBC: Recent Labs  Lab 04/28/23 1840 04/29/23 0700 04/30/23 0506 05/01/23 0511 05/03/23 0522 05/04/23 0500  WBC 10.7* 9.4 8.6 8.5 9.2 7.9  NEUTROABS 7.8*  --   --   --   --  4.8  HGB 12.7* 12.8* 11.7* 11.3* 12.5* 11.9*  HCT 39.7 39.5 37.5* 36.1* 39.2 38.0*  MCV 91.3 90.8 93.5 94.3 92.2 94.3  PLT 379 367 337 332 410* 356   Basic Metabolic Panel: Recent Labs  Lab 04/29/23 0700 04/30/23 0506 05/01/23 0511 05/03/23 0522 05/04/23 0500  NA 137 136 135 133* 137  K 3.9 4.0 4.0 3.6 3.8  CL 101 100 100 99 101  CO2 27 27 27 25 24   GLUCOSE 132* 131* 112* 128* 122*  BUN 15 20 15 13 14   CREATININE 0.85 1.00 0.88 0.79 0.75  CALCIUM 8.9 8.5* 8.3* 8.6* 8.7*  MG  --  2.1 1.9 2.1 1.9  PHOS  --   --   --  3.6  --    GFR: Estimated Creatinine Clearance: 123.6 mL/min (by C-G formula based on SCr of 0.75 mg/dL). Liver Function Tests: Recent Labs  Lab 04/28/23 1840 04/30/23 0506 05/01/23 0511 05/03/23 0522 05/04/23 0500  AST 18 17 14* 15 15  ALT 17 16 14 13 13   ALKPHOS 82 74 72 83 72  BILITOT 0.5 0.5 0.4 0.3 0.5  PROT 7.1 6.4* 6.1* 7.0 6.1*  ALBUMIN 3.5 3.0* 2.9* 3.5 3.0*     Code Status:  Full code.  Code status decision has been confirmed with: patient    Severity of Illness:  The appropriate patient status for this patient is INPATIENT. Inpatient status is judged to be reasonable and  necessary in order to provide the required intensity of service to ensure the patient's safety. The patient's presenting symptoms, physical exam findings, and initial radiographic and laboratory data in the context of their chronic comorbidities is felt to place them at high risk for further clinical deterioration. Furthermore, it is not anticipated that the patient will be medically stable for discharge from the hospital within 2 midnights of admission.   * I certify that at the point of admission it is my clinical judgment that the patient will require inpatient hospital care spanning beyond 2 midnights from the point of admission due to high intensity of service, high risk for further deterioration and high frequency of surveillance required.*  Time spent:  45 minutes  Author:  Marinda Elk MD  05/04/2023 9:50 AM

## 2023-05-04 NOTE — Assessment & Plan Note (Signed)
 Presence of advanced COPD complicates patient's presentation

## 2023-05-04 NOTE — Assessment & Plan Note (Signed)
 Continuing Accu-Cheks before every meal and nightly with sliding scale insulin

## 2023-05-04 NOTE — Assessment & Plan Note (Signed)
 Continue diltiazem.

## 2023-05-04 NOTE — Assessment & Plan Note (Addendum)
 Lengthy discussion with the patient surrounding his slowly worsening lung disease and guarded prognosis Patient continues to wish to receive all modalities of care and wishes to remain full code While patient does not want hospice, he is interested in receiving some degree of outpatient palliative services. Case discussed with TOC and arrangements for home health and palliative services will be arranged at time of discharge.

## 2023-05-04 NOTE — Progress Notes (Signed)
   05/04/23 2027  BiPAP/CPAP/SIPAP  BiPAP/CPAP/SIPAP Pt Type Adult  Reason BIPAP/CPAP not in use Non-compliant

## 2023-05-04 NOTE — Assessment & Plan Note (Signed)
 BAL performed 2/17 confirmatory of ongoing adenocarcinoma of the lungs Patient follows with oncology at the [facility], not actively receiving treatment

## 2023-05-04 NOTE — Assessment & Plan Note (Signed)
 -  Continue Flomax

## 2023-05-04 NOTE — Progress Notes (Signed)
 Mobility Specialist - Progress Note   05/04/23 1149  Oxygen Therapy  SpO2 (!) 83 %  O2 Device Nasal Cannula  O2 Flow Rate (L/min) 6 L/min  Patient Activity (if Appropriate) Ambulating  Mobility  Activity Ambulated independently in hallway  Level of Assistance Independent  Assistive Device None  Distance Ambulated (ft) 80 ft  Activity Response Tolerated well  Mobility Referral Yes  Mobility visit 1 Mobility  Mobility Specialist Start Time (ACUTE ONLY) 1141  Mobility Specialist Stop Time (ACUTE ONLY) 1149  Mobility Specialist Time Calculation (min) (ACUTE ONLY) 8 min   Pt received in bed and agreeable to mobility. Pt took x1 seated rest break d/t SOB. Upon returning to room, pt desat to 83%. Encouraged pursed lip breaths bringing SpO2 to 92%. No complaints during session. Pt to EOB after session with all needs met.   [facility]

## 2023-05-04 NOTE — Progress Notes (Signed)
   [facility] Liaison Note:  Notified by [facility] manager of patient/family request for [facility] Palliative services at home after discharge.   Please call with any hospice or outpatient palliative care related questions.   Thank you for the opportunity to participate in this patient's care.   Glenna Fellows, BSN, RN, OCN [employer] [phone]

## 2023-05-04 NOTE — Assessment & Plan Note (Addendum)
 Thought to be secondary to pneumonia likely in combination with progressive pulmonary malignancy and advanced COPD Unclear as to what patient's current baseline is although I think patient's long-term pulmonary status is worsening Manage with antibiotics and bronchodilator therapy as mentioned above Will reassess home-going oxygen requirements prior to discharge.

## 2023-05-04 NOTE — Plan of Care (Signed)
  Problem: Education: Goal: Ability to describe self-care measures that may prevent or decrease complications (Diabetes Survival Skills Education) will improve Outcome: Progressing   Problem: Coping: Goal: Ability to adjust to condition or change in health will improve Outcome: Progressing   Problem: Health Behavior/Discharge Planning: Goal: Ability to manage health-related needs will improve Outcome: Progressing   Problem: Nutritional: Goal: Progress toward achieving an optimal weight will improve Outcome: Progressing   Problem: Health Behavior/Discharge Planning: Goal: Ability to manage health-related needs will improve Outcome: Progressing   Problem: Clinical Measurements: Goal: Respiratory complications will improve Outcome: Progressing   Problem: Activity: Goal: Risk for activity intolerance will decrease Outcome: Progressing

## 2023-05-05 ENCOUNTER — Other Ambulatory Visit: Payer: Self-pay

## 2023-05-05 ENCOUNTER — Other Ambulatory Visit (HOSPITAL_COMMUNITY): Payer: Self-pay

## 2023-05-05 ENCOUNTER — Encounter (HOSPITAL_COMMUNITY): Payer: Self-pay

## 2023-05-05 ENCOUNTER — Emergency Department (HOSPITAL_COMMUNITY)

## 2023-05-05 ENCOUNTER — Inpatient Hospital Stay (HOSPITAL_COMMUNITY)
Admission: EM | Admit: 2023-05-05 | Discharge: 2023-05-16 | DRG: 177 | Disposition: A | Discharge status: Home Health | Attending: Family Medicine | Admitting: Family Medicine

## 2023-05-05 DIAGNOSIS — J9621 Acute and chronic respiratory failure with hypoxia: Secondary | ICD-10-CM | POA: Diagnosis present

## 2023-05-05 DIAGNOSIS — J441 Chronic obstructive pulmonary disease with (acute) exacerbation: Secondary | ICD-10-CM | POA: Diagnosis present

## 2023-05-05 DIAGNOSIS — R0600 Dyspnea, unspecified: Secondary | ICD-10-CM | POA: Diagnosis not present

## 2023-05-05 DIAGNOSIS — C3490 Malignant neoplasm of unspecified part of unspecified bronchus or lung: Secondary | ICD-10-CM | POA: Diagnosis not present

## 2023-05-05 DIAGNOSIS — Z9981 Dependence on supplemental oxygen: Secondary | ICD-10-CM

## 2023-05-05 DIAGNOSIS — G894 Chronic pain syndrome: Secondary | ICD-10-CM | POA: Diagnosis present

## 2023-05-05 DIAGNOSIS — E669 Obesity, unspecified: Secondary | ICD-10-CM | POA: Diagnosis present

## 2023-05-05 DIAGNOSIS — Z7901 Long term (current) use of anticoagulants: Secondary | ICD-10-CM | POA: Diagnosis not present

## 2023-05-05 DIAGNOSIS — I10 Essential (primary) hypertension: Secondary | ICD-10-CM | POA: Diagnosis present

## 2023-05-05 DIAGNOSIS — Z923 Personal history of irradiation: Secondary | ICD-10-CM | POA: Diagnosis not present

## 2023-05-05 DIAGNOSIS — F4325 Adjustment disorder with mixed disturbance of emotions and conduct: Secondary | ICD-10-CM | POA: Diagnosis present

## 2023-05-05 DIAGNOSIS — Z85118 Personal history of other malignant neoplasm of bronchus and lung: Secondary | ICD-10-CM

## 2023-05-05 DIAGNOSIS — E1122 Type 2 diabetes mellitus with diabetic chronic kidney disease: Secondary | ICD-10-CM | POA: Diagnosis present

## 2023-05-05 DIAGNOSIS — J440 Chronic obstructive pulmonary disease with acute lower respiratory infection: Secondary | ICD-10-CM | POA: Diagnosis present

## 2023-05-05 DIAGNOSIS — N400 Enlarged prostate without lower urinary tract symptoms: Secondary | ICD-10-CM | POA: Diagnosis present

## 2023-05-05 DIAGNOSIS — F39 Unspecified mood [affective] disorder: Secondary | ICD-10-CM | POA: Diagnosis present

## 2023-05-05 DIAGNOSIS — Z888 Allergy status to other drugs, medicaments and biological substances status: Secondary | ICD-10-CM | POA: Diagnosis not present

## 2023-05-05 DIAGNOSIS — N1831 Chronic kidney disease, stage 3a: Secondary | ICD-10-CM | POA: Diagnosis present

## 2023-05-05 DIAGNOSIS — Z86711 Personal history of pulmonary embolism: Secondary | ICD-10-CM

## 2023-05-05 DIAGNOSIS — Z85828 Personal history of other malignant neoplasm of skin: Secondary | ICD-10-CM

## 2023-05-05 DIAGNOSIS — I129 Hypertensive chronic kidney disease with stage 1 through stage 4 chronic kidney disease, or unspecified chronic kidney disease: Secondary | ICD-10-CM | POA: Diagnosis present

## 2023-05-05 DIAGNOSIS — E1159 Type 2 diabetes mellitus with other circulatory complications: Secondary | ICD-10-CM | POA: Diagnosis present

## 2023-05-05 DIAGNOSIS — R000 Tachycardia, unspecified: Secondary | ICD-10-CM | POA: Diagnosis present

## 2023-05-05 DIAGNOSIS — Z1152 Encounter for screening for COVID-19: Secondary | ICD-10-CM | POA: Diagnosis not present

## 2023-05-05 DIAGNOSIS — Z91018 Allergy to other foods: Secondary | ICD-10-CM | POA: Diagnosis not present

## 2023-05-05 DIAGNOSIS — Z87891 Personal history of nicotine dependence: Secondary | ICD-10-CM | POA: Diagnosis not present

## 2023-05-05 DIAGNOSIS — G4733 Obstructive sleep apnea (adult) (pediatric): Secondary | ICD-10-CM | POA: Diagnosis present

## 2023-05-05 DIAGNOSIS — E66813 Obesity, class 3: Secondary | ICD-10-CM | POA: Diagnosis present

## 2023-05-05 DIAGNOSIS — I252 Old myocardial infarction: Secondary | ICD-10-CM

## 2023-05-05 DIAGNOSIS — E119 Type 2 diabetes mellitus without complications: Secondary | ICD-10-CM | POA: Diagnosis not present

## 2023-05-05 DIAGNOSIS — Z6838 Body mass index (BMI) 38.0-38.9, adult: Secondary | ICD-10-CM | POA: Diagnosis not present

## 2023-05-05 DIAGNOSIS — J9601 Acute respiratory failure with hypoxia: Secondary | ICD-10-CM | POA: Diagnosis present

## 2023-05-05 DIAGNOSIS — J189 Pneumonia, unspecified organism: Secondary | ICD-10-CM | POA: Diagnosis present

## 2023-05-05 DIAGNOSIS — Z713 Dietary counseling and surveillance: Secondary | ICD-10-CM

## 2023-05-05 DIAGNOSIS — J690 Pneumonitis due to inhalation of food and vomit: Principal | ICD-10-CM | POA: Diagnosis present

## 2023-05-05 DIAGNOSIS — I2699 Other pulmonary embolism without acute cor pulmonale: Secondary | ICD-10-CM | POA: Diagnosis present

## 2023-05-05 DIAGNOSIS — I152 Hypertension secondary to endocrine disorders: Secondary | ICD-10-CM | POA: Diagnosis present

## 2023-05-05 DIAGNOSIS — C3492 Malignant neoplasm of unspecified part of left bronchus or lung: Secondary | ICD-10-CM | POA: Diagnosis present

## 2023-05-05 DIAGNOSIS — Z79899 Other long term (current) drug therapy: Secondary | ICD-10-CM

## 2023-05-05 DIAGNOSIS — R0602 Shortness of breath: Secondary | ICD-10-CM | POA: Diagnosis present

## 2023-05-05 LAB — CBC WITH DIFFERENTIAL/PLATELET
Abs Immature Granulocytes: 0.03 10*3/uL (ref 0.00–0.07)
Basophils Absolute: 0.1 10*3/uL (ref 0.0–0.1)
Basophils Relative: 1 %
Eosinophils Absolute: 0.3 10*3/uL (ref 0.0–0.5)
Eosinophils Relative: 4 %
HCT: 37.5 % — ABNORMAL LOW (ref 39.0–52.0)
Hemoglobin: 11.7 g/dL — ABNORMAL LOW (ref 13.0–17.0)
Immature Granulocytes: 0 %
Lymphocytes Relative: 24 %
Lymphs Abs: 1.9 10*3/uL (ref 0.7–4.0)
MCH: 29.3 pg (ref 26.0–34.0)
MCHC: 31.2 g/dL (ref 30.0–36.0)
MCV: 93.8 fL (ref 80.0–100.0)
Monocytes Absolute: 0.9 10*3/uL (ref 0.1–1.0)
Monocytes Relative: 11 %
Neutro Abs: 5 10*3/uL (ref 1.7–7.7)
Neutrophils Relative %: 60 %
Platelets: 385 10*3/uL (ref 150–400)
RBC: 4 MIL/uL — ABNORMAL LOW (ref 4.22–5.81)
RDW: 13 % (ref 11.5–15.5)
WBC: 8.3 10*3/uL (ref 4.0–10.5)
nRBC: 0 % (ref 0.0–0.2)

## 2023-05-05 LAB — COMPREHENSIVE METABOLIC PANEL
ALT: 13 U/L (ref 0–44)
AST: 13 U/L — ABNORMAL LOW (ref 15–41)
Albumin: 2.9 g/dL — ABNORMAL LOW (ref 3.5–5.0)
Alkaline Phosphatase: 73 U/L (ref 38–126)
Anion gap: 9 (ref 5–15)
BUN: 17 mg/dL (ref 8–23)
CO2: 26 mmol/L (ref 22–32)
Calcium: 8.2 mg/dL — ABNORMAL LOW (ref 8.9–10.3)
Chloride: 101 mmol/L (ref 98–111)
Creatinine, Ser: 0.81 mg/dL (ref 0.61–1.24)
GFR, Estimated: >60 mL/min (ref >60)
Glucose, Bld: 115 mg/dL — ABNORMAL HIGH (ref 70–99)
Potassium: 3.8 mmol/L (ref 3.5–5.1)
Sodium: 136 mmol/L (ref 135–145)
Total Bilirubin: 0.3 mg/dL (ref 0.0–1.2)
Total Protein: 6.2 g/dL — ABNORMAL LOW (ref 6.5–8.1)

## 2023-05-05 LAB — GLUCOSE, CAPILLARY
Glucose-Capillary: 170 mg/dL — ABNORMAL HIGH (ref 70–99)
Glucose-Capillary: 120 mg/dL — ABNORMAL HIGH (ref 70–99)
Glucose-Capillary: 169 mg/dL — ABNORMAL HIGH (ref 70–99)

## 2023-05-05 LAB — PHOSPHORUS: Phosphorus: 3.9 mg/dL (ref 2.5–4.6)

## 2023-05-05 LAB — MAGNESIUM: Magnesium: 2 mg/dL (ref 1.7–2.4)

## 2023-05-05 MED ORDER — HYDRALAZINE HCL 25 MG PO TABS
25.0000 mg | ORAL_TABLET | Freq: Two times a day (BID) | ORAL | Status: DC
  Administered 2023-05-05 – 2023-05-08 (×7): 25 mg via ORAL
  Filled 2023-05-05 (×7): qty 1

## 2023-05-05 MED ORDER — DOCUSATE SODIUM 100 MG PO CAPS
200.0000 mg | ORAL_CAPSULE | Freq: Two times a day (BID) | ORAL | Status: DC
  Administered 2023-05-05 – 2023-05-07 (×5): 200 mg via ORAL
  Filled 2023-05-05 (×5): qty 2

## 2023-05-05 MED ORDER — BUSPIRONE HCL 5 MG PO TABS
15.0000 mg | ORAL_TABLET | Freq: Every day | ORAL | Status: DC
  Administered 2023-05-06 – 2023-05-16 (×11): 15 mg via ORAL
  Filled 2023-05-05 (×11): qty 3

## 2023-05-05 MED ORDER — PREDNISONE 10 MG PO TABS
ORAL_TABLET | ORAL | 0 refills | Status: DC
  Filled 2023-05-05: qty 45, 15d supply, fill #0

## 2023-05-05 MED ORDER — DILTIAZEM HCL ER COATED BEADS 180 MG PO CP24
180.0000 mg | ORAL_CAPSULE | Freq: Every day | ORAL | Status: DC
  Administered 2023-05-06 – 2023-05-16 (×11): 180 mg via ORAL
  Filled 2023-05-05 (×11): qty 1

## 2023-05-05 MED ORDER — DULOXETINE HCL 60 MG PO CPEP
60.0000 mg | ORAL_CAPSULE | Freq: Two times a day (BID) | ORAL | Status: DC
  Administered 2023-05-05 – 2023-05-16 (×22): 60 mg via ORAL
  Filled 2023-05-05 (×22): qty 1

## 2023-05-05 MED ORDER — OXCARBAZEPINE 300 MG PO TABS
600.0000 mg | ORAL_TABLET | Freq: Three times a day (TID) | ORAL | Status: DC
Start: 2023-05-05 — End: 2023-05-16
  Administered 2023-05-05 – 2023-05-16 (×32): 600 mg via ORAL
  Filled 2023-05-05 (×34): qty 2

## 2023-05-05 MED ORDER — CYCLOBENZAPRINE HCL 10 MG PO TABS
10.0000 mg | ORAL_TABLET | Freq: Every evening | ORAL | Status: DC | PRN
  Administered 2023-05-05 – 2023-05-15 (×11): 10 mg via ORAL
  Filled 2023-05-05 (×11): qty 1

## 2023-05-05 MED ORDER — ENOXAPARIN SODIUM 60 MG/0.6ML IJ SOSY
60.0000 mg | PREFILLED_SYRINGE | INTRAMUSCULAR | Status: DC
  Administered 2023-05-05 – 2023-05-15 (×11): 60 mg via SUBCUTANEOUS
  Filled 2023-05-05 (×11): qty 0.6

## 2023-05-05 MED ORDER — SALINE SPRAY 0.65 % NA SOLN
1.0000 | Freq: Every day | NASAL | Status: DC | PRN
  Filled 2023-05-05: qty 44

## 2023-05-05 MED ORDER — PREDNISONE 50 MG PO TABS
50.0000 mg | ORAL_TABLET | Freq: Every day | ORAL | Status: DC
  Administered 2023-05-06 – 2023-05-07 (×2): 50 mg via ORAL
  Filled 2023-05-05 (×2): qty 1

## 2023-05-05 MED ORDER — TAMSULOSIN HCL 0.4 MG PO CAPS
0.8000 mg | ORAL_CAPSULE | Freq: Every day | ORAL | Status: DC
  Administered 2023-05-06 – 2023-05-16 (×11): 0.8 mg via ORAL
  Filled 2023-05-05 (×11): qty 2

## 2023-05-05 MED ORDER — ARFORMOTEROL TARTRATE 15 MCG/2ML IN NEBU
15.0000 ug | INHALATION_SOLUTION | Freq: Two times a day (BID) | RESPIRATORY_TRACT | Status: DC
  Administered 2023-05-06 – 2023-05-16 (×19): 15 ug via RESPIRATORY_TRACT
  Filled 2023-05-05 (×20): qty 2

## 2023-05-05 MED ORDER — AZELASTINE HCL 0.1 % NA SOLN: 1.0000 | Freq: Two times a day (BID) | NASAL | Status: DC | PRN

## 2023-05-05 MED ORDER — PANTOPRAZOLE SODIUM 40 MG PO TBEC
40.0000 mg | DELAYED_RELEASE_TABLET | Freq: Every day | ORAL | Status: DC
  Administered 2023-05-06 – 2023-05-16 (×11): 40 mg via ORAL
  Filled 2023-05-05 (×11): qty 1

## 2023-05-05 MED ORDER — MELATONIN 3 MG PO TABS
6.0000 mg | ORAL_TABLET | Freq: Every day | ORAL | Status: DC
  Administered 2023-05-05 – 2023-05-15 (×11): 6 mg via ORAL
  Filled 2023-05-05 (×11): qty 2

## 2023-05-05 MED ORDER — CYCLOSPORINE 0.05 % OP EMUL
1.0000 [drp] | Freq: Two times a day (BID) | OPHTHALMIC | Status: DC
  Administered 2023-05-05 – 2023-05-16 (×22): 1 [drp] via OPHTHALMIC
  Filled 2023-05-05 (×22): qty 30

## 2023-05-05 MED ORDER — TRAZODONE HCL 50 MG PO TABS
75.0000 mg | ORAL_TABLET | Freq: Every day | ORAL | Status: DC
  Administered 2023-05-05 – 2023-05-15 (×11): 75 mg via ORAL
  Filled 2023-05-05 (×11): qty 2

## 2023-05-05 MED ORDER — PREDNISOLONE 5 MG PO TABS: 50.0000 mg | ORAL_TABLET | Freq: Every day | ORAL | Status: DC

## 2023-05-05 MED ORDER — UMECLIDINIUM BROMIDE 62.5 MCG/ACT IN AEPB
1.0000 | INHALATION_SPRAY | Freq: Every day | RESPIRATORY_TRACT | Status: DC
  Filled 2023-05-05: qty 7

## 2023-05-05 MED ORDER — BISACODYL 5 MG PO TBEC: 10.0000 mg | DELAYED_RELEASE_TABLET | Freq: Every day | ORAL | Status: DC | PRN

## 2023-05-05 MED ORDER — CARBOXYMETHYLCELLULOSE SOD PF 0.5 % OP SOLN: 1.0000 [drp] | Freq: Four times a day (QID) | OPHTHALMIC | Status: DC | PRN

## 2023-05-05 MED ORDER — PSYLLIUM 95 % PO PACK
1.0000 | PACK | Freq: Every day | ORAL | Status: DC
  Administered 2023-05-06 – 2023-05-14 (×6): 1 via ORAL
  Filled 2023-05-05 (×9): qty 1

## 2023-05-05 MED ORDER — IPRATROPIUM-ALBUTEROL 0.5-2.5 (3) MG/3ML IN SOLN
3.0000 mL | Freq: Once | RESPIRATORY_TRACT | Status: AC
  Administered 2023-05-05: 3 mL via RESPIRATORY_TRACT
  Filled 2023-05-05: qty 3

## 2023-05-05 MED ORDER — BUPROPION HCL ER (XL) 300 MG PO TB24
300.0000 mg | ORAL_TABLET | Freq: Every morning | ORAL | Status: DC
  Administered 2023-05-06 – 2023-05-16 (×11): 300 mg via ORAL
  Filled 2023-05-05 (×11): qty 1

## 2023-05-05 MED ORDER — GABAPENTIN 100 MG PO CAPS
100.0000 mg | ORAL_CAPSULE | Freq: Three times a day (TID) | ORAL | Status: DC
  Administered 2023-05-05 – 2023-05-16 (×32): 100 mg via ORAL
  Filled 2023-05-05 (×32): qty 1

## 2023-05-05 MED ORDER — OXYCODONE HCL 5 MG PO TABS
10.0000 mg | ORAL_TABLET | Freq: Two times a day (BID) | ORAL | Status: DC | PRN
  Administered 2023-05-05 – 2023-05-15 (×8): 10 mg via ORAL
  Filled 2023-05-05 (×11): qty 2

## 2023-05-05 MED ORDER — ALBUTEROL SULFATE (2.5 MG/3ML) 0.083% IN NEBU
2.5000 mg | INHALATION_SOLUTION | Freq: Four times a day (QID) | RESPIRATORY_TRACT | Status: DC | PRN
  Filled 2023-05-05: qty 3

## 2023-05-05 MED ORDER — POLYVINYL ALCOHOL 1.4 % OP SOLN
1.0000 [drp] | Freq: Four times a day (QID) | OPHTHALMIC | Status: DC | PRN
  Administered 2023-05-11: 1 [drp] via OPHTHALMIC
  Filled 2023-05-05: qty 15

## 2023-05-05 MED ORDER — HYDROCODONE-ACETAMINOPHEN 10-325 MG PO TABS
1.0000 | ORAL_TABLET | Freq: Four times a day (QID) | ORAL | Status: DC | PRN
  Administered 2023-05-05 – 2023-05-16 (×23): 1 via ORAL
  Filled 2023-05-05 (×24): qty 1

## 2023-05-05 MED ORDER — VITAMIN D 25 MCG (1000 UNIT) PO TABS
1000.0000 [IU] | ORAL_TABLET | Freq: Every day | ORAL | Status: DC
  Administered 2023-05-06 – 2023-05-16 (×11): 1000 [IU] via ORAL
  Filled 2023-05-05 (×11): qty 1

## 2023-05-05 NOTE — ED Provider Notes (Signed)
 [place] EMERGENCY DEPARTMENT AT [facility] Provider Note   CSN: [id number] Arrival date & time: 05/05/23  1609     History  Chief Complaint  Patient presents with   Shortness of Breath    Mario [name] is a 73 y.o. male.   Shortness of Breath Patient presents with shortness of breath.  Discharge from hospital this afternoon.  Did not make it out of the parking lot.  States he went to his car and was too short of breath.  States his heart rate was 150.  States he was feeling okay in the hospital.  Still short of breath.    Past Medical History:  Diagnosis Date   Ankylosing spondylitis lumbar region [facility]) 09/25/2022   Anxiety    Bronchitis    COPD (chronic obstructive pulmonary disease) (HCC)    Depression    History of radiation therapy    Left lung- 10/05/20-10/15/20- Dr. Antony Blackbird   Hypertension    lung ca 09/2020   MI (myocardial infarction) [facility])    ????   On home oxygen therapy    4L/min [place]   OSA (obstructive sleep apnea)    Suicide attempt (HCC)    Tension pneumothorax 06/27/2016   Uveitis     Home Medications Prior to Admission medications   Medication Sig Start Date End Date Taking? Authorizing Provider  acetaminophen (TYLENOL) 500 MG tablet Take 1,000 mg by mouth every 6 (six) hours as needed for mild pain (pain score 1-3) or moderate pain (pain score 4-6).    [provider]  albuterol (PROVENTIL) (2.5 MG/3ML) 0.083% nebulizer solution Take 2.5 mg by nebulization every 6 (six) hours as needed for wheezing or shortness of breath.    [provider]  albuterol (VENTOLIN HFA) 108 (90 Base) MCG/ACT inhaler Inhale 2 puffs into the lungs every 4 (four) hours as needed for wheezing or shortness of breath. 11/04/21   Glade Lloyd, MD  azelastine (ASTELIN) 0.1 % nasal spray Place 1 spray into both nostrils 2 (two) times daily as needed for allergies. Use in each nostril as directed    [provider]  bisacodyl  (DULCOLAX) 5 MG EC tablet Take 2 tablets (10 mg total) by mouth daily as needed for moderate constipation. 01/26/23   Arby Barrette, MD  buPROPion (WELLBUTRIN XL) 300 MG 24 hr tablet Take 300 mg by mouth every morning. 02/05/20   [provider]  busPIRone (BUSPAR) 15 MG tablet Take 15 mg by mouth in the morning.    [provider]  Carboxymethylcellulose Sod PF 0.5 % SOLN Place 1 drop into both eyes 4 (four) times daily as needed (dry eyes).    [provider]  cholecalciferol (VITAMIN D3) 25 MCG (1000 UNIT) tablet Take 1,000 Units by mouth daily.    [provider]  cyclobenzaprine (FLEXERIL) 10 MG tablet Take 10 mg by mouth at bedtime as needed for muscle spasms.    [provider]  cycloSPORINE (RESTASIS) 0.05 % ophthalmic emulsion Place 1 drop into both eyes every 12 (twelve) hours.    [provider]  diltiazem (CARDIZEM CD) 180 MG 24 hr capsule Take 180 mg by mouth in the morning.    [provider]  docusate sodium (COLACE) 100 MG capsule Take 200 mg by mouth 2 (two) times daily.    [provider]  DULoxetine (CYMBALTA) 60 MG capsule Take 60 mg by mouth 2 (two) times daily.    [provider]  gabapentin (  NEURONTIN) 100 MG capsule Take 100 mg by mouth 3 (three) times daily.    [provider]  hydrALAZINE (APRESOLINE) 25 MG tablet Take 1 tablet (25 mg total) by mouth 2 (two) times daily. 04/24/23   Alwyn Ren, MD  HYDROcodone-acetaminophen [facility]) 10-325 MG tablet Take 1 tablet by mouth every 6 (six) hours as needed for moderate pain (pain score 4-6).    [provider]  LACTULOSE PO Take 2 Scoops by mouth 2 (two) times daily as needed. 2 tablespoonfuls    [provider]  Melatonin 3 MG CAPS Take 6 mg by mouth at bedtime.    [provider]  omeprazole (PRILOSEC) 20 MG capsule Take 1 capsule (20 mg total) by mouth daily. Patient taking differently: Take 20 mg by  mouth daily as needed. 01/26/23   Arby Barrette, MD  Oxcarbazepine (TRILEPTAL) 300 MG tablet Take 600 mg by mouth in the morning, at noon, and at bedtime.    [provider]  Oxycodone HCl 10 MG TABS Take 1 tablet (10 mg total) by mouth 2 (two) times daily as needed for moderate pain (pain score 4-6) or severe pain (pain score 7-10). 04/24/23   Alwyn Ren, MD  OXYGEN Inhale 5 L/min into the lungs daily.    [provider]  predniSONE (DELTASONE) 10 MG tablet Take 5 tablets (50 mg total) by mouth daily with breakfast for 3 days, THEN 4 tablets (40 mg total) daily with breakfast for 3 days, THEN 3 tablets (30 mg total) daily with breakfast for 3 days, THEN 2 tablets (20 mg total) daily with breakfast for 3 days, THEN 1 tablet (10 mg total) daily with breakfast for 3 days. 05/06/23 05/21/23  Marinda Elk, MD  psyllium (HYDROCIL/METAMUCIL) 95 % PACK Take 1 packet by mouth daily. Patient not taking: Reported on 04/26/2023 04/02/23   Leatha Gilding, MD  sodium chloride (OCEAN) 0.65 % SOLN nasal spray Place 1 spray into both nostrils daily at 2 am. Patient taking differently: Place 1 spray into both nostrils daily as needed for congestion. 04/24/23   Alwyn Ren, MD  tamsulosin (FLOMAX) 0.4 MG CAPS capsule Take 0.8 mg by mouth daily.    [provider]  Tiotropium Bromide-Olodaterol 2.5-2.5 MCG/ACT AERS Inhale 2 each into the lungs every morning. 2 puffs    [provider]  traZODone (DESYREL) 50 MG tablet Take 75 mg by mouth at bedtime.    [provider]      Allergies    Demerol [meperidine], Zocor [simvastatin], Beet [beta vulgaris], and Liver    Review of Systems   Review of Systems  Respiratory:  Positive for shortness of breath.     Physical Exam Updated Vital Signs BP (!) 154/96   Pulse (!) 129   Temp 98.1 F (36.7 C)   Resp 18   SpO2 92%  Physical Exam Vitals and nursing note reviewed.  Pulmonary:     Comments:   somewhat harsh breath sounds. Skin:    Capillary Refill: Capillary refill takes less than 2 seconds.  Neurological:     Mental Status: He is alert.     ED Results / Procedures / Treatments   Labs (all labs ordered are listed, but only abnormal results are displayed) Labs Reviewed - No data to display  EKG EKG Interpretation Date/Time:  Friday May 05 2023 16:21:53 EST Ventricular Rate:  129 PR Interval:    QRS Duration:  108 QT Interval:  351 QTC  Calculation: 515 R Axis:   -21  Text Interpretation: Sinus tachycardia Inferior infarct, old Prolonged QT interval Confirmed by Benjiman Core [phone]) on 05/05/2023 5:51:20 PM  Radiology No results found.  Procedures Procedures    Medications Ordered in ED Medications  ipratropium-albuterol (DUONEB) 0.5-2.5 (3) MG/3ML nebulizer solution 3 mL (has no administration in time range)    ED Course/ Medical Decision Making/ A&P                                 Medical Decision Making Amount and/or Complexity of Data Reviewed Radiology: ordered.   Patient shortness of breath.  Had not even mated off hospital property before became short of breath again.  States his heart rate went up to 150.  Still has heart rate of 120 or 130 at this time. X-ray done and stable to prior.  Is on chronic oxygen at 5 L.  Reviewed discharge note and it appears that he does have some chronic changes however with unable to ambulate even to the car will discuss with hospitalist for readmission.       Final Clinical Impression(s) / ED Diagnoses Final diagnoses:  Dyspnea, unspecified type  Malignant neoplasm of lung, unspecified laterality, unspecified part of lung [facility])    Rx / DC Orders ED Discharge Orders     None         Benjiman Core, MD 05/05/23 1818

## 2023-05-05 NOTE — Discharge Instructions (Addendum)
 Please take all prescribed medications exactly as instructed including your tapering regimen of prednisone. Please consume a low carbohydrate diet Please increase your physical activity as tolerated. Please continue to use your supplemental oxygen, 5 liters per minute via nasal canula, at all times. Please maintain all outpatient follow-up appointments including follow-up with your primary care provider and your Oncologist at the [facility] Please return to the emergency department if you develop worsening shortness of breath, fevers in excess of 100.4 F, weakness or inability to tolerate oral intake.

## 2023-05-05 NOTE — Progress Notes (Signed)
 I met with Mr. Crystal just prior to discharge. He shared his desire to amend his HCPOA. He also articulated a desire to be DNR and I informed hism that his doctor could make that change with a MOST form.  Mr. Dengel shared his goals for EOL care and that he has a daughter as well as friends (including those he regards as "prayer-warriors") that he could name as surrogate decision makers.  I provided copmpassionate presence and active listening. I explored values and goals of care with Mr. Hardaway, helping to protect his stated preferences by facilitating notarizing his new HCPOA. I placed a paper copy in the shadow chart and provided the original and two copies to Mr. Luff. Will scan document into e-chart (pending).  I also checked in with staff to offer support for them and better understand needs specific to patient.  Oral Hallgren L. Sophronia Simas, M.Div [phone]

## 2023-05-05 NOTE — Discharge Summary (Signed)
 Physician Discharge Summary   Patient: Mario [name] MRN: [id number] DOB: [birth year]  Admit date:     04/28/2023  Discharge date: 05/05/23  Discharge Physician: Marinda Elk   PCP: [name]   Recommendations at discharge:   Please take all prescribed medications exactly as instructed including your tapering regimen of prednisone. Please consume a low carbohydrate diet Please increase your physical activity as tolerated. Please continue to use your supplemental oxygen, 5 liters per minute via nasal canula, at all times. Please maintain all outpatient follow-up appointments including follow-up with your primary care provider and your Oncologist at the [facility] hospital Please return to the emergency department if you develop worsening shortness of breath, fevers in excess of 100.4 F, weakness or inability to tolerate oral intake.  Discharge Diagnoses: Principal Problem:   Right lower lobe pneumonia Active Problems:   Adjustment disorder with mixed disturbance of emotions and conduct   Acute on chronic hypoxic respiratory failure (HCC)   DM2 (diabetes mellitus, type 2) (HCC)   COPD (chronic obstructive pulmonary disease) (HCC)   Non-small cell cancer of 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   Essential hypertension   history of Pulmonary embolism (HCC)   BPH (benign prostatic hyperplasia)   OSA (obstructive sleep apnea)   Goals of care, counseling/discussion   Anxiety  Resolved Problems:   * No resolved hospital problems. *   Hospital Course: 73 year old male with advanced COPD with known history of adenocarcinoma of the lungs (reconfirmed on 2/17 via bronchoscopy), chronic hypoxic respiratory failure on 4 to 5 L of oxygen at home, history of PE now off anticoagulation due to hemoptysis and epistaxis.  Presenting [facility] with shortness of breath thought to be suffering from COPD exacerbation with right lower lobe pneumonia.    Of note, patient  has had multiple recent hospitalizations.  Patient was admitted from 1/17 until 2/1 and then was readmitted the same day on 2/1 and remained hospitalized till 2/24.  Patient then returned to [facility] once again on the same day and was hospitalized from 2/24 until 2/28.  Once again he returned on the same day of discharge with reports of hypoxia at home.  Upon patient, patient has been managed with intravenous antibiotics and bronchodilator therapy.  Patient's COPD with exacerbation was felt to likely also be playing a role and therefore patient was also given systemic steroids.  Over the course the hospitalization the patient was provided with 5 days of intravenous ceftriaxone and azithromycin.  Despite aggressive treatment, patient experienced very little overall improvement in his symptoms and after extensive evaluation it was felt that the majority of the patient's symptoms were likely gradual progression of the patient's multifactorial pulmonary disease.    Patient was felt to be suffering from progressive advanced COPD with superimposed lung malignancy.  Goals of care discussions were had with the patient with assistance of our palliative care team.  Patient was advised of his deteriorating prognosis and need for frequent hospitalizations as a result.  Patient declined hospice services and wished to remain full code.    A good portion of the patient's persisting shortness of breath was likely secondary to longstanding clinical deterioration likely due to a combination of advanced COPD and lung malignancy.After several days of treatment patient was felt to clinically be at his new baseline.  Arranges were made for the patient to be discharged home with an extended steroid taper.    Arrangements were additionally made for the  patient to receive home health physical therapy and aide.  Patient was advised to follow closely with both his primary care provider and his oncologist at the [facility] for  follow-up and determination of next steps particularly surrounding his malignancy.  Patient was discharged home in stable condition 05/05/2023.      Pain control - [employer] Controlled Substance Reporting System database was reviewed. and patient was instructed, not to drive, operate heavy machinery, perform activities at heights, swimming or participation in water activities or provide baby-sitting services while on Pain, Sleep and Anxiety Medications; until their outpatient Physician has advised to do so again. Also recommended to not to take more than prescribed Pain, Sleep and Anxiety Medications.   Consultants: Palliative Care Procedures performed: None  Disposition: Home health Diet recommendation:  Discharge Diet Orders (From admission, onward)     Start     Ordered   05/05/23 0000  Diet Carb Modified        05/05/23 1435           Carb modified diet  DISCHARGE MEDICATION: Allergies as of 05/05/2023       Reactions   Demerol [meperidine] Nausea And Vomiting, Other (See Comments)   Made the patient "violently sick"   Zocor [simvastatin] Nausea And Vomiting, Other (See Comments)   Made him very jittery, also   Beet [beta Vulgaris] Nausea And Vomiting   Liver Nausea And Vomiting   Any Liver        Medication List     STOP taking these medications    metFORMIN 850 MG tablet Commonly known as: GLUCOPHAGE       TAKE these medications    acetaminophen 500 MG tablet Commonly known as: TYLENOL Take 1,000 mg by mouth every 6 (six) hours as needed for mild pain (pain score 1-3) or moderate pain (pain score 4-6).   albuterol (2.5 MG/3ML) 0.083% nebulizer solution Commonly known as: PROVENTIL Take 2.5 mg by nebulization every 6 (six) hours as needed for wheezing or shortness of breath.   albuterol 108 (90 Base) MCG/ACT inhaler Commonly known as: VENTOLIN HFA Inhale 2 puffs into the lungs every 4 (four) hours as needed for wheezing or shortness of breath.    azelastine 0.1 % nasal spray Commonly known as: ASTELIN Place 1 spray into both nostrils 2 (two) times daily as needed for allergies. Use in each nostril as directed   bisacodyl 5 MG EC tablet Commonly known as: DULCOLAX Take 2 tablets (10 mg total) by mouth daily as needed for moderate constipation.   buPROPion 300 MG 24 hr tablet Commonly known as: WELLBUTRIN XL Take 300 mg by mouth every morning.   busPIRone 15 MG tablet Commonly known as: BUSPAR Take 15 mg by mouth in the morning.   Carboxymethylcellulose Sod PF 0.5 % Soln Place 1 drop into both eyes 4 (four) times daily as needed (dry eyes).   cholecalciferol 25 MCG (1000 UNIT) tablet Commonly known as: VITAMIN D3 Take 1,000 Units by mouth daily.   cyclobenzaprine 10 MG tablet Commonly known as: FLEXERIL Take 10 mg by mouth at bedtime as needed for muscle spasms.   cycloSPORINE 0.05 % ophthalmic emulsion Commonly known as: RESTASIS Place 1 drop into both eyes every 12 (twelve) hours.   diltiazem 180 MG 24 hr capsule Commonly known as: CARDIZEM CD Take 180 mg by mouth in the morning.   docusate sodium 100 MG capsule Commonly known as: COLACE Take 200 mg by mouth 2 (two) times daily.  DULoxetine 60 MG capsule Commonly known as: CYMBALTA Take 60 mg by mouth 2 (two) times daily.   gabapentin 100 MG capsule Commonly known as: NEURONTIN Take 100 mg by mouth 3 (three) times daily.   hydrALAZINE 25 MG tablet Commonly known as: APRESOLINE Take 1 tablet (25 mg total) by mouth 2 (two) times daily.   HYDROcodone-acetaminophen 10-325 MG tablet Commonly known as: NORCO Take 1 tablet by mouth every 6 (six) hours as needed for moderate pain (pain score 4-6).   LACTULOSE PO Take 2 Scoops by mouth 2 (two) times daily as needed. 2 tablespoonfuls   Melatonin 3 MG Caps Take 6 mg by mouth at bedtime.   omeprazole 20 MG capsule Commonly known as: PRILOSEC Take 1 capsule (20 mg total) by mouth daily. What changed:   when to take this reasons to take this   Oxcarbazepine 300 MG tablet Commonly known as: TRILEPTAL Take 600 mg by mouth in the morning, at noon, and at bedtime.   Oxycodone HCl 10 MG Tabs Take 1 tablet (10 mg total) by mouth 2 (two) times daily as needed for moderate pain (pain score 4-6) or severe pain (pain score 7-10).   OXYGEN Inhale 5 L/min into the lungs daily.   predniSONE 10 MG tablet Commonly known as: DELTASONE Take 5 tablets (50 mg total) by mouth daily with breakfast for 3 days, THEN 4 tablets (40 mg total) daily with breakfast for 3 days, THEN 3 tablets (30 mg total) daily with breakfast for 3 days, THEN 2 tablets (20 mg total) daily with breakfast for 3 days, THEN 1 tablet (10 mg total) daily with breakfast for 3 days. Start taking on: May 06, 2023   psyllium 95 % Pack Commonly known as: HYDROCIL/METAMUCIL Take 1 packet by mouth daily.   sodium chloride 0.65 % Soln nasal spray Commonly known as: OCEAN Place 1 spray into both nostrils daily at 2 am. What changed:  when to take this reasons to take this   tamsulosin 0.4 MG Caps capsule Commonly known as: FLOMAX Take 0.8 mg by mouth daily.   Tiotropium Bromide-Olodaterol 2.5-2.5 MCG/ACT Aers Inhale 2 each into the lungs every morning. 2 puffs   traZODone 50 MG tablet Commonly known as: DESYREL Take 75 mg by mouth at bedtime.        Follow-up Information     [facility]   Contact information: [address] [place] [address] [phone]                 Discharge Exam: Filed Weights   05/03/23 0500 05/04/23 0500 05/05/23 0500  Weight: 135.6 kg 134.8 kg 135.3 kg    Constitutional: Awake alert and oriented x3, no associated distress.   Respiratory: clear to auscultation bilaterally, no wheezing, no crackles. Normal respiratory effort. No accessory muscle use.  Cardiovascular: Regular rate and rhythm, no murmurs / rubs / gallops. No extremity  edema. 2+ pedal pulses. No carotid bruits.  Abdomen: Abdomen is soft and nontender.  No evidence of intra-abdominal masses.  Positive bowel sounds noted in all quadrants.   Musculoskeletal: No joint deformity upper and lower extremities. Good ROM, no contractures. Normal muscle tone.     Condition at discharge: fair  The results of significant diagnostics from this hospitalization (including imaging, microbiology, ancillary and laboratory) are listed below for reference.   Imaging Studies: DG Chest 2 View Result Date: 05/02/2023 CLINICAL DATA:  Cough. EXAM: CHEST - 2 VIEW COMPARISON:  April 28, 2023.  FINDINGS: Stable cardiomediastinal silhouette. Bibasilar opacities are again noted which may represent scarring, but acute superimposed edema or inflammation cannot be excluded. Small pleural effusions are noted. Bony thorax is unremarkable. IMPRESSION: Stable bibasilar opacities as noted above. Electronically Signed   By: Lupita Raider M.D.   On: 05/02/2023 18:43   CT Angio Chest PE W and/or Wo Contrast Result Date: 04/28/2023 CLINICAL DATA:  PE suspected. Shortness of breath with exertion. Headache. Low oxygen saturations. EXAM: CT ANGIOGRAPHY CHEST WITH CONTRAST TECHNIQUE: Multidetector CT imaging of the chest was performed using the standard protocol during bolus administration of intravenous contrast. Multiplanar CT image reconstructions and MIPs were obtained to evaluate the vascular anatomy. RADIATION DOSE REDUCTION: This exam was performed according to the departmental dose-optimization program which includes automated exposure control, adjustment of the mA and/or kV according to patient size and/or use of iterative reconstruction technique. CONTRAST:  75mL OMNIPAQUE IOHEXOL 350 MG/ML SOLN COMPARISON:  Same day chest radiograph and CT chest 04/06/2023 FINDINGS: Cardiovascular: Negative for acute pulmonary embolism. Coronary artery and aortic atherosclerotic calcification. No pericardial  effusion. Mediastinum/Nodes: Trachea is patent. There is layering debris in the left mainstem bronchus. Unremarkable esophagus. No thoracic adenopathy. Lungs/Pleura: Centrilobular and paraseptal emphysema greatest in the upper lobes. Consolidative and ground-glass opacities in the lingula and left lower lobe are similar to prior. Increased consolidation and ground-glass opacities in the right lower lobe compared with 04/06/2023. Increased small right pleural effusion and similar small left pleural effusion. Bronchial wall thickening greatest in the lower lungs. No pneumothorax. Upper Abdomen: No acute abnormality. Musculoskeletal: No acute fracture. Chronic left lateral seventh and eighth rib fractures. Thoracic spondylosis with fusion of anterior osteophytes. Review of the MIP images confirms the above findings. IMPRESSION: 1. Negative for acute pulmonary embolism. 2. Increased consolidation and ground-glass opacities in the right lower lobe compared with 04/06/2023. Findings favor worsening pneumonia. 3. Similar consolidative and ground-glass opacities in the lingula and left lower lobe. Differential considerations include pneumonia, radiation fibrosis, or malignancy given history of lung cancer. 4. Increased small right pleural effusion and similar small left pleural effusion. 5. Debris in the left mainstem bronchus.  Query aspiration. Electronically Signed   By: Minerva Fester M.D.   On: 04/28/2023 21:22   DG Chest Portable 1 View Result Date: 04/28/2023 CLINICAL DATA:  Shortness of breath.  Headache. EXAM: PORTABLE CHEST 1 VIEW COMPARISON:  Chest radiograph dated 04/24/2023 and CT dated 04/06/2023 FINDINGS: Background of emphysema and chronic interstitial coarsening. Bibasilar airspace densities similar to prior radiograph and CT. There is a small right pleural effusion. No pneumothorax. Stable cardiac silhouette no acute osseous pathology. IMPRESSION: No interval change since the prior radiograph.  Electronically Signed   By: Elgie Collard M.D.   On: 04/28/2023 18:36   CT Head Wo Contrast Result Date: 04/27/2023 CLINICAL DATA:  Left-sided headaches for weeks, initial encounter EXAM: CT HEAD WITHOUT CONTRAST TECHNIQUE: Contiguous axial images were obtained from the base of the skull through the vertex without intravenous contrast. RADIATION DOSE REDUCTION: This exam was performed according to the departmental dose-optimization program which includes automated exposure control, adjustment of the mA and/or kV according to patient size and/or use of iterative reconstruction technique. COMPARISON:  10/12/2021 FINDINGS: Brain: No evidence of acute infarction, hemorrhage, hydrocephalus, extra-axial collection or mass lesion/mass effect. Chronic atrophic changes and white matter ischemic changes noted. Vascular: No hyperdense vessel or unexpected calcification. Skull: Normal. Negative for fracture or focal lesion. Sinuses/Orbits: No acute finding. Other: None. IMPRESSION: Chronic atrophic and  ischemic changes.  No acute abnormality noted. Electronically Signed   By: Alcide Clever M.D.   On: 04/27/2023 02:57   DG Chest Portable 1 View Result Date: 04/24/2023 CLINICAL DATA:  Shortness of breath EXAM: PORTABLE CHEST 1 VIEW COMPARISON:  Chest x-ray 04/12/2023.  CT chest 04/06/2023. FINDINGS: There small bilateral pleural effusions. There are patchy airspace opacities in the bilateral lower lungs, increasing on the right. The cardiomediastinal silhouette is stable. There is no pneumothorax or acute fracture. IMPRESSION: 1. Small bilateral pleural effusions. 2. Patchy airspace opacities in the bilateral lower lungs, increasing on the right. Electronically Signed   By: Darliss Cheney M.D.   On: 04/24/2023 21:46   DG Sinuses Complete Result Date: 04/21/2023 CLINICAL DATA:  History of epistaxis, bilateral but worse on the left. EXAM: PARANASAL SINUSES - COMPLETE 3 + VIEW four views COMPARISON:  CT head 10/12/2021.  FINDINGS: There is suggestion of layering fluid within the left maxillary sinus. Additional fluid versus mucosal thickening in the inferior left frontal sinus. Visualized paranasal sinuses are otherwise aerated. No evidence of air-fluid level on the right. Tubing overlying the left nasal cavity likely corresponding to nasal mucosal compression device. No significant bone abnormalities are seen. IMPRESSION: Air-fluid level in the left maxillary sinus, likely layering blood products. Mucosal thickening versus fluid in the inferior left frontal sinus. Electronically Signed   By: Emily Filbert M.D.   On: 04/21/2023 13:53   DG Chest 1 View Result Date: 04/12/2023 CLINICAL DATA:  Hemoptysis EXAM: PORTABLE CHEST 1 VIEW COMPARISON:  04/01/2023 FINDINGS: Cardiac shadow is stable. Persistent bibasilar airspace opacities are noted left greater than right with associated small effusions. The overall appearance is stable from the prior study. No pneumothorax is seen. IMPRESSION: Stable appearance when compared with the previous exam. Electronically Signed   By: Alcide Clever M.D.   On: 04/12/2023 10:07   CT CHEST W CONTRAST Result Date: 04/06/2023 CLINICAL DATA:  Hemoptysis. EXAM: CT CHEST WITH CONTRAST TECHNIQUE: Multidetector CT imaging of the chest was performed during intravenous contrast administration. RADIATION DOSE REDUCTION: This exam was performed according to the departmental dose-optimization program which includes automated exposure control, adjustment of the mA and/or kV according to patient size and/or use of iterative reconstruction technique. CONTRAST:  75mL OMNIPAQUE IOHEXOL 300 MG/ML  SOLN COMPARISON:  March 17, 2023. FINDINGS: Cardiovascular: Atherosclerosis of thoracic aorta is noted without aneurysm or dissection. Normal cardiac size. No pericardial effusion. Mild coronary artery calcifications are noted. Mediastinum/Nodes: No enlarged mediastinal, hilar, or axillary lymph nodes. Thyroid gland,  trachea, and esophagus demonstrate no significant findings. Lungs/Pleura: No pneumothorax is noted. Emphysematous disease is noted bilaterally. Small loculated pleural effusion is noted laterally in the left lung base. Stable airspace opacity is noted in left lower lobe and lingular segment left upper lobe concerning for pneumonia or possibly fibrosis from prior radiation treatment. Multiple patchy airspace opacities are noted in the right lower lobe which are slightly decreased compared to prior exam, most consistent with multifocal pneumonia, although underlying malignancy cannot be excluded given the history of lung cancer. Upper Abdomen: No acute abnormality. Musculoskeletal: Old left rib fractures are again noted. No definite acute osseous abnormality is noted. IMPRESSION: Stable large airspace opacity is noted in left lower lobe as well as in lingular segment of left upper lobe most consistent with pneumonia, or possibly fibrosis from prior radiation treatment given the history of lung cancer. Small loculated fluid collection is seen laterally in the left lung base which potentially may represent empyema.  Continued airspace opacity is noted posteriorly in right lower lobe which is slightly improved compared to prior exam and most consistent with pneumonia. Mild coronary artery calcifications are noted. Aortic Atherosclerosis (ICD10-I70.0) and Emphysema (ICD10-J43.9). Electronically Signed   By: Lupita Raider M.D.   On: 04/06/2023 12:39    Microbiology: Results for orders placed or performed during the hospital encounter of 04/28/23  Resp panel by RT-PCR (RSV, Flu A&B, Covid) Anterior Nasal Swab     Status: None   Collection Time: 04/28/23  9:41 PM   Specimen: Anterior Nasal Swab  Result Value Ref Range Status   SARS Coronavirus 2 by RT PCR NEGATIVE NEGATIVE Final    Comment: (NOTE) SARS-CoV-2 target nucleic acids are NOT DETECTED.  The SARS-CoV-2 RNA is generally detectable in upper  respiratory specimens during the acute phase of infection. The lowest concentration of SARS-CoV-2 viral copies this assay can detect is 138 copies/mL. A negative result does not preclude SARS-Cov-2 infection and should not be used as the sole basis for treatment or other patient management decisions. A negative result may occur with  improper specimen collection/handling, submission of specimen other than nasopharyngeal swab, presence of viral mutation(s) within the areas targeted by this assay, and inadequate number of viral copies(<138 copies/mL). A negative result must be combined with clinical observations, patient history, and epidemiological information. The expected result is Negative.  Fact Sheet for Patients:  BloggerCourse.com  Fact Sheet for Healthcare Providers:  SeriousBroker.it  This test is no t yet approved or cleared by the [place] FDA and  has been authorized for detection and/or diagnosis of SARS-CoV-2 by FDA under an Emergency Use Authorization (EUA). This EUA will remain  in effect (meaning this test can be used) for the duration of the COVID-19 declaration under Section 564(b)(1) of the Act, 21 U.S.C.section 360bbb-3(b)(1), unless the authorization is terminated  or revoked sooner.       Influenza A by PCR NEGATIVE NEGATIVE Final   Influenza B by PCR NEGATIVE NEGATIVE Final    Comment: (NOTE) The Xpert Xpress SARS-CoV-2/FLU/RSV plus assay is intended as an aid in the diagnosis of influenza from Nasopharyngeal swab specimens and should not be used as a sole basis for treatment. Nasal washings and aspirates are unacceptable for Xpert Xpress SARS-CoV-2/FLU/RSV testing.  Fact Sheet for Patients: BloggerCourse.com  Fact Sheet for Healthcare Providers: SeriousBroker.it  This test is not yet approved or cleared by the [place] FDA and has been  authorized for detection and/or diagnosis of SARS-CoV-2 by FDA under an Emergency Use Authorization (EUA). This EUA will remain in effect (meaning this test can be used) for the duration of the COVID-19 declaration under Section 564(b)(1) of the Act, 21 U.S.C. section 360bbb-3(b)(1), unless the authorization is terminated or revoked.     Resp Syncytial Virus by PCR NEGATIVE NEGATIVE Final    Comment: (NOTE) Fact Sheet for Patients: BloggerCourse.com  Fact Sheet for Healthcare Providers: SeriousBroker.it  This test is not yet approved or cleared by the [place] FDA and has been authorized for detection and/or diagnosis of SARS-CoV-2 by FDA under an Emergency Use Authorization (EUA). This EUA will remain in effect (meaning this test can be used) for the duration of the COVID-19 declaration under Section 564(b)(1) of the Act, 21 U.S.C. section 360bbb-3(b)(1), unless the authorization is terminated or revoked.  Performed at [facility], [address] [place], [place] [address]     Labs: CBC: Recent Labs  Lab 04/28/23 1840 04/29/23 0700  04/30/23 0506 05/01/23 0511 05/03/23 0522 05/04/23 0500 05/05/23 0431  WBC 10.7*   < > 8.6 8.5 9.2 7.9 8.3  NEUTROABS 7.8*  --   --   --   --  4.8 5.0  HGB 12.7*   < > 11.7* 11.3* 12.5* 11.9* 11.7*  HCT 39.7   < > 37.5* 36.1* 39.2 38.0* 37.5*  MCV 91.3   < > 93.5 94.3 92.2 94.3 93.8  PLT 379   < > 337 332 410* 356 385   < > = values in this interval not displayed.   Basic Metabolic Panel: Recent Labs  Lab 04/30/23 0506 05/01/23 0511 05/03/23 0522 05/04/23 0500 05/05/23 0431  NA 136 135 133* 137 136  K 4.0 4.0 3.6 3.8 3.8  CL 100 100 99 101 101  CO2 27 27 25 24 26   GLUCOSE 131* 112* 128* 122* 115*  BUN 20 15 13 14 17   CREATININE 1.00 0.88 0.79 0.75 0.81  CALCIUM 8.5* 8.3* 8.6* 8.7* 8.2*  MG 2.1 1.9 2.1 1.9 2.0  PHOS  --   --  3.6  --  3.9   Liver  Function Tests: Recent Labs  Lab 04/30/23 0506 05/01/23 0511 05/03/23 0522 05/04/23 0500 05/05/23 0431  AST 17 14* 15 15 13*  ALT 16 14 13 13 13   ALKPHOS 74 72 83 72 73  BILITOT 0.5 0.4 0.3 0.5 0.3  PROT 6.4* 6.1* 7.0 6.1* 6.2*  ALBUMIN 3.0* 2.9* 3.5 3.0* 2.9*   CBG: Recent Labs  Lab 05/04/23 1132 05/04/23 1641 05/04/23 2151 05/05/23 0738 05/05/23 1142  GLUCAP 146* 173* 170* 120* 169*    Discharge time spent: greater than 30 minutes.  Signed: Marinda Elk, MD [facility] 05/05/2023

## 2023-05-05 NOTE — Progress Notes (Signed)
 Discharge medication from [facility] outpatient pharmacy delivered by this RN

## 2023-05-05 NOTE — ED Triage Notes (Signed)
 Pt reports having shob and was just released from the hospital today. Pt is on 5 L per baseline.

## 2023-05-05 NOTE — Plan of Care (Signed)
   Problem: Coping: Goal: Ability to adjust to condition or change in health will improve Outcome: Progressing   Problem: Fluid Volume: Goal: Ability to maintain a balanced intake and output will improve Outcome: Progressing

## 2023-05-06 DIAGNOSIS — J441 Chronic obstructive pulmonary disease with (acute) exacerbation: Secondary | ICD-10-CM

## 2023-05-06 DIAGNOSIS — C3490 Malignant neoplasm of unspecified part of unspecified bronchus or lung: Secondary | ICD-10-CM

## 2023-05-06 LAB — HIV ANTIBODY (ROUTINE TESTING W REFLEX): HIV Screen 4th Generation wRfx: NONREACTIVE

## 2023-05-06 LAB — COMPREHENSIVE METABOLIC PANEL
ALT: 14 U/L (ref 0–44)
AST: 10 U/L — ABNORMAL LOW (ref 15–41)
Albumin: 3.6 g/dL (ref 3.5–5.0)
Alkaline Phosphatase: 79 U/L (ref 38–126)
Anion gap: 9 (ref 5–15)
BUN: 15 mg/dL (ref 8–23)
CO2: 25 mmol/L (ref 22–32)
Calcium: 8.5 mg/dL — ABNORMAL LOW (ref 8.9–10.3)
Chloride: 99 mmol/L (ref 98–111)
Creatinine, Ser: 0.75 mg/dL (ref 0.61–1.24)
GFR, Estimated: >60 mL/min (ref >60)
Glucose, Bld: 107 mg/dL — ABNORMAL HIGH (ref 70–99)
Potassium: 4 mmol/L (ref 3.5–5.1)
Sodium: 133 mmol/L — ABNORMAL LOW (ref 135–145)
Total Bilirubin: 0.6 mg/dL (ref 0.0–1.2)
Total Protein: 7.2 g/dL (ref 6.5–8.1)

## 2023-05-06 LAB — CBC
HCT: 40.6 % (ref 39.0–52.0)
Hemoglobin: 12.5 g/dL — ABNORMAL LOW (ref 13.0–17.0)
MCH: 28.8 pg (ref 26.0–34.0)
MCHC: 30.8 g/dL (ref 30.0–36.0)
MCV: 93.5 fL (ref 80.0–100.0)
Platelets: 458 10*3/uL — ABNORMAL HIGH (ref 150–400)
RBC: 4.34 MIL/uL (ref 4.22–5.81)
RDW: 13 % (ref 11.5–15.5)
WBC: 10.8 10*3/uL — ABNORMAL HIGH (ref 4.0–10.5)
nRBC: 0 % (ref 0.0–0.2)

## 2023-05-06 NOTE — Plan of Care (Signed)
  Problem: Education: Goal: Knowledge of General Education information will improve Description: Including pain rating scale, medication(s)/side effects and non-pharmacologic comfort measures Outcome: Progressing   Problem: Clinical Measurements: Goal: Will remain free from infection Outcome: Progressing Goal: Diagnostic test results will improve Outcome: Progressing Goal: Cardiovascular complication will be avoided Outcome: Progressing   Problem: Nutrition: Goal: Adequate nutrition will be maintained Outcome: Progressing   Problem: Elimination: Goal: Will not experience complications related to bowel motility Outcome: Progressing Goal: Will not experience complications related to urinary retention Outcome: Progressing   Problem: Safety: Goal: Ability to remain free from injury will improve Outcome: Progressing   Problem: Skin Integrity: Goal: Risk for impaired skin integrity will decrease Outcome: Progressing   Problem: Activity: Goal: Ability to tolerate increased activity will improve Outcome: Progressing   Problem: Respiratory: Goal: Ability to maintain a clear airway will improve Outcome: Progressing Goal: Ability to maintain adequate ventilation will improve Outcome: Progressing

## 2023-05-06 NOTE — Evaluation (Signed)
 Occupational Therapy Evaluation and Discharge Patient Details Name: Mario [name] MRN: [id number] DOB: [birth year] Today's Date: 05/06/2023   History of Present Illness   Mario [name] is a 73 y.o. male returns from parking lot of hospital with shortness of breath, admitted 05/05/23. Of note, ecurrent hospitalization 2/28-3/7 with acute on chronic hypoxic respiratory failure secondary to right lower lobe pneumonia and COPD exacerbation, suicidal ideation, multiple medical problems.    PMH: shortness of breath as a result of COPD, lung cancer, hx of PE     Clinical Impressions Pt is functioning modified independently in ADLs and mobility. He is well aware of his limitations and monitors his O2 sats and HR with home pulse oximeter. Pt has all necessary DME. No further OT needs.      If plan is discharge home, recommend the following:   Assistance with cooking/housework     Functional Status Assessment   Patient has not had a recent decline in their functional status     Equipment Recommendations   None recommended by OT     Recommendations for Other Services         Precautions/Restrictions   Precautions Precaution/Restrictions Comments: Monitor O2 and HR, 5L O2 baseline Restrictions Weight Bearing Restrictions Per Provider Order: No     Mobility Bed Mobility Overal bed mobility: Modified Independent                  Transfers Overall transfer level: Modified independent Equipment used: None                      Balance Overall balance assessment: No apparent balance deficits (not formally assessed)                                         ADL either performed or assessed with clinical judgement   ADL Overall ADL's : Modified independent                         Toilet Transfer: Modified Independent   Toileting- Clothing Manipulation and Hygiene: Modified independent       Functional mobility during  ADLs: Modified independent General ADL Comments: Pt is well versed in pursed lip breathing and energy conservation. Monitors his O2 sats and HR regularly.     Vision Ability to See in Adequate Light: 0 Adequate Patient Visual Report: No change from baseline       Perception         Praxis         Pertinent Vitals/Pain Pain Assessment Pain Assessment: Faces Faces Pain Scale: Hurts little more Pain Location: R shoulder Pain Descriptors / Indicators: Discomfort, Grimacing, Guarding Pain Intervention(s): Monitored during session     Extremity/Trunk Assessment Upper Extremity Assessment Upper Extremity Assessment: Right hand dominant;RUE deficits/detail RUE Deficits / Details: longstanding rotator cuff dysfunction with pain   Lower Extremity Assessment Lower Extremity Assessment: Defer to PT evaluation   Cervical / Trunk Assessment Cervical / Trunk Assessment: Normal   Communication Communication Communication: No apparent difficulties   Cognition Arousal: Alert Behavior During Therapy: WFL for tasks assessed/performed Cognition: No apparent impairments                               Following commands: Intact  Cueing  General Comments          Exercises     Shoulder Instructions      Home Living Family/patient expects to be discharged to:: Private residence Living Arrangements: Non-relatives/Friends Available Help at Discharge: Friend(s);Other (Comment) (senior care aide) Type of Home: House Home Access: Level entry     Home Layout: Two level Alternate Level Stairs-Number of Steps: Split Level : enter on main level with kitchen/living  up 5 steps to bedroom and bathroom Alternate Level Stairs-Rails: Left Bathroom Shower/Tub: Chief Strategy Officer: Standard     Home Equipment: Cane - single point;Shower Counsellor (2 wheels);Rollator (4 wheels);Other (comment);Hand held shower head;Adaptive equipment Adaptive  Equipment: Reacher Additional Comments: Wears 5 L O2 at baseline, has many pulse oximeters      Prior Functioning/Environment Prior Level of Function : Needs assist             Mobility Comments: Ambulates in home; gets SOB easily, seated rest breaks to recover; walks without AD ADLs Comments: Independent with ADLs; has assist with IADLs; senior helper 3 days/week assists with microwave meals, light cleaning, companionship    OT Problem List:     OT Treatment/Interventions:        OT Goals(Current goals can be found in the care plan section)   Acute Rehab OT Goals OT Goal Formulation: All assessment and education complete, DC therapy   OT Frequency:       Co-evaluation              AM-PAC OT "6 Clicks" Daily Activity     Outcome Measure Help from another person eating meals?: None Help from another person taking care of personal grooming?: None Help from another person toileting, which includes using toliet, bedpan, or urinal?: None Help from another person bathing (including washing, rinsing, drying)?: None Help from another person to put on and taking off regular upper body clothing?: None Help from another person to put on and taking off regular lower body clothing?: None 6 Click Score: 24   End of Session Equipment Utilized During Treatment: Oxygen (5L)  Activity Tolerance: Patient tolerated treatment well Patient left: in bed;with call bell/phone within reach  OT Visit Diagnosis: Other (comment) (decreased activity tolerance)                Time: 1660-6301 OT Time Calculation (min): 37 min Charges:  OT General Charges $OT Visit: 1 Visit OT Evaluation $OT Eval Low Complexity: 1 Low OT Treatments $Self Care/Home Management : 8-22 mins  Berna Spare, OTR/L Acute Rehabilitation Services Office: [phone]   Evern Bio 05/06/2023, 3:45 PM

## 2023-05-06 NOTE — TOC Initial Note (Addendum)
 Transition of Care [facility]) - Initial/Assessment Note    Patient Details  Name: Mario [name] MRN: [id number] Date of Birth: [birth year]  [facility] CM/SW Contact:    Beckie Busing, RN Phone Number:[phone]  05/06/2023, 3:32 PM  Clinical Narrative:                 TOC following patient with high risk for readmission. Patient has PT HH recommendation . CM offered choice . Patient would like to continue services with [facility]. Message has been sent to [facility] with [facility]. AVS has been updated. HH orders have been entered.   Patient is from home where he normally functions independently. Patient is O2 dependent. Patient states that he does live with a roommate ans is able to maintain without difficulty. Patient has PCP Dr. Gerald Leitz at [facility]. Patient has access to affordable medications. Patient reports that he does have DME (cane, walker, rollator  & toilet seat extension. Currently there are no other needs noted. TOC will continue to follow for disposition needs.   Expected Discharge Plan: Home w Home Health Services Barriers to Discharge: No Barriers Identified   Patient Goals and CMS Choice Patient states their goals for this hospitalization and ongoing recovery are:: Wants to go home CMS Medicare.gov Compare Post Acute Care list provided to:: Patient Choice offered to / list presented to : Patient  ownership interest in [facility].provided to::  (n/a)    Expected Discharge Plan and Services In-house Referral: NA Discharge Planning Services: CM Consult Post Acute Care Choice: Home Health Living arrangements for the past 2 months: Single Family Home                 DME Arranged: N/A DME Agency: NA       HH Arranged: PT HH Agency:  ([facility]) Date HH Agency Contacted: 05/06/23 Time HH Agency Contacted: 1529 Representative spoke with at [facility] Agency: Clifton Custard  Prior Living Arrangements/Services Living arrangements  for the past 2 months: Single Family Home Lives with:: Roommate Patient language and need for interpreter reviewed:: Yes Do you feel safe going back to the place where you live?: Yes      Need for Family Participation in Patient Care: Yes (Comment) Care giver support system in place?: Yes (comment) Current home services: DME (O2 ,cane,rollator,walker, toilet extension) Criminal Activity/Legal Involvement Pertinent to Current Situation/Hospitalization: No - Comment as needed  Activities of Daily Living   ADL Screening (condition at time of admission) Independently performs ADLs?: Yes (appropriate for developmental age) Is the patient deaf or have difficulty hearing?: No Does the patient have difficulty seeing, even when wearing glasses/contacts?: No Does the patient have difficulty concentrating, remembering, or making decisions?: No  Permission Sought/Granted Permission sought to share information with : Family Supports Permission granted to share information with : No              Emotional Assessment Appearance:: Appears stated age Attitude/Demeanor/Rapport: Gracious Affect (typically observed): Pleasant Orientation: : Oriented to Self, Oriented to Place, Oriented to  Time, Oriented to Situation Alcohol / Substance Use: Not Applicable Psych Involvement: No (comment)  Admission diagnosis:  Malignant neoplasm of lung, unspecified laterality, unspecified part of lung (HCC) [C34.90] Acute hypoxemic respiratory failure (HCC) [J96.01] Dyspnea, unspecified type [R06.00] Patient Active Problem List   Diagnosis Date Noted   Malignant neoplasm of lung (HCC) 05/06/2023   Acute hypoxemic respiratory failure (HCC) 05/05/2023   Right lower lobe pneumonia 04/28/2023   Epistaxis  04/19/2023   Hemoptysis 04/06/2023   COPD exacerbation (HCC) 04/04/2023   COPD with acute bronchitis (HCC) 04/01/2023   RSV bronchitis 03/22/2023   Community acquired pneumonia 03/17/2023   History of lung  cancer 03/17/2023   Acute hypoxic respiratory failure (HCC) 03/17/2023   RSV (respiratory syncytial virus pneumonia) 03/17/2023   Ankylosing spondylitis lumbar region (HCC) 09/25/2022   Vasculitis (HCC) 07/02/2022   Chronic pain 07/02/2022   history of Pulmonary embolism (HCC) 07/02/2022   Anxiety    Acute on chronic hypoxic respiratory failure (HCC) 11/02/2021   Adjustment disorder with depressed mood 10/09/2021   BPH (benign prostatic hyperplasia) 09/23/2021   Fracture of ribs, two, left, sequela 08/24/2021   Metastatic carcinoma (HCC) 08/07/2021   Non-small cell lung cancer, left (HCC) 08/07/2021   Chronic respiratory failure with hypoxia, on home O2 therapy (HCC) - 4 L/min 08/07/2021   Rib pain 08/03/2021   History of falling 07/29/2021   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   DM2 (diabetes mellitus, type 2) (HCC) 07/16/2017   HLA B27 (HLA B27 positive) 07/16/2017   Hyperkalemia    Hypoxia    Dyspnea 05/23/2014   Malingering 01/21/2013   Tobacco abuse 01/11/2013   Obesity,  unspecified 01/11/2013   Hypertension associated with diabetes (HCC) 01/10/2013   PCP:  Clinic, [name] Pharmacy:   [facility] - [place], [place] - [address] [place] [address] Phone: [phone] Fax: [phone]  [facility] [address] [place] [address] Phone: [phone] Fax: [phone]     Social Drivers of Health (SDOH) Social History: SDOH Screenings   Food Insecurity: No Food Insecurity (05/05/2023)  Housing: Low Risk  (05/05/2023)  Transportation Needs: No Transportation Needs (05/05/2023)  Utilities: Not At Risk (05/05/2023)  Financial Resource Strain: Low Risk  (09/23/2020)  Social Connections: Socially Isolated (05/05/2023)  Stress: No Stress Concern Present (09/23/2020)  Tobacco Use: Medium Risk (05/05/2023)   SDOH Interventions:     Readmission Risk Interventions    05/06/2023    3:25 PM 04/24/2023   10:09 AM 03/25/2023    2:23 PM  Readmission Risk Prevention Plan  Transportation Screening Complete Complete Complete  Medication Review Oceanographer) Complete Complete Complete  PCP or Specialist appointment within 3-5 days of discharge Complete    HRI or Home Care Consult Complete Complete Complete  SW Recovery Care/Counseling Consult Complete Complete Complete  Palliative Care Screening Not Applicable Not Applicable Not Applicable  Skilled Nursing Facility Not Applicable Not Applicable Not Applicable

## 2023-05-06 NOTE — Progress Notes (Signed)
 PROGRESS NOTE    Mario [name]  ZOX:[id number] DOB: 22-Oct-1950 DOA: 05/05/2023 PCP: [name]    Brief Narrative:  This 73 y.o. male with medical history significant of COPD, lung cancer, Chronic shortness of breath at exertion , Who has had recurrent hospitalizations with acute on chronic hypoxic respiratory failure secondary to right lower lobe pneumonia and COPD exacerbation, suicidal ideation, multiple medical problems.  Patient was admitted for above and discharged today on oxygen.  He was discharged after several attempts and as patient was going home at the parking lot apparently he got more short of breath and returned to the ER.  Patient reported that he has his concentrator going only to 5 L.  On room air , he is on 4 L.  When he arrived the ER his heart rate was in the 150s.  He was placed on 6 L of oxygen and then being admitted to the medical service.  Currently he is stable on 4 L at rest.  He goes to the [place] and insist that the [place] will not approve concentrator that is more than 5 L/min.  He is scared of going home.  He has had chronic respiratory failure and appears to be at his baseline earlier in the day.  Patient was readmitted and needs proper discharge planning.  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   It appears patient also has higher oxygen demand than his concentrator can provide.   Patient likely will need home oxygen that goes to at least 6 L or more when he has exertion.   Patient readmitted.  Will attempt to come up with treatment plan that with enable patient stay home rather than multiple hospitalizations.   In the meantime resumed steroid, nebulizer treatments and antibiotics.   Adjustment disorder with mixed disturbance of emotions and conduct:  This may be playing a major role in patient's decision and recurrent hospitalization.   Will continue with home regimen and counseling.   Multilobar pneumonia: Continue with antibiotics and supportive care.   Diabetes Mellitus II : Diet controlled.   Resume sliding scale insulin.  Continue to monitor   Essential hypertension:  Continue hydralazine and diltiazem.     Chronic pain syndrome:  Resume home regimen of oxycodone.   Non-small cell cancer of the left lung:  Continue follow-up with oncology at the [facility].  Currently not on treatment.   History of pulmonary embolism: Resume chronic anticoagulation.   Chronic kidney disease stage IIIa: Appears to be at baseline.  Continue to monitor   Morbid obesity: Dietary counseling   Obstructive sleep apnea: Not on CPAP or BiPAP   BPH: Continue Flomax.     DVT prophylaxis: Lovenox Code Status: Full code Family Communication: No family at bed  side Disposition Plan:    Status is: Inpatient Remains inpatient appropriate because:   Readmitted with worsening shortness of breath    Consultants:  None  Procedures: None Antimicrobials: Anti-infectives (From admission, onward)    None       Subjective: Patient seen and examined at bedside.  Overnight events noted. Patient is morbidly obese , has significant shortness of breath even with walking few steps.   Patient remains on 5 L of supplemental  oxygen.  Objective: Vitals:   05/05/23 2348 05/06/23 0539 05/06/23 0725 05/06/23 0813  BP: (!) 147/83 (!) 164/97 136/76   Pulse: 99 90 88   Resp: 17 18 16    Temp: 97.9 F (36.6 C) 97.6 F (36.4 C) 97.8 F (36.6 C)   TempSrc: Oral Oral Oral   SpO2: 91% 92% 98% 93%  Weight:      Height:        Intake/Output Summary (Last 24 hours) at 05/06/2023 1218 Last data filed at 05/06/2023 0818 Gross per 24 hour  Intake 480 ml  Output 550 ml  Net -70 ml   Filed Weights   05/05/23 2000  Weight: 135.3 kg    Examination:  General exam: Appears calm and comfortable, morbidly obese, not in any acute distress. Respiratory system: Clear to auscultation. Respiratory effort normal.  RR 15 Cardiovascular system: S1 & S2 heard, RRR. No JVD, murmurs, rubs, gallops or clicks.  Gastrointestinal system: Abdomen is non distended, soft and non tender. Normal bowel sounds heard. Central nervous system: Alert and oriented X 3. No focal neurological deficits. Extremities: Symmetric 5 x 5 power. Skin: No rashes, lesions or ulcers Psychiatry: Judgement and insight appear normal. Mood & affect appropriate.     Data Reviewed: I have personally reviewed following labs and imaging studies  CBC: Recent Labs  Lab 05/01/23 0511 05/03/23 0522 05/04/23 0500 05/05/23 0431 05/06/23 0743  WBC 8.5 9.2 7.9 8.3 10.8*  NEUTROABS  --   --  4.8 5.0  --   HGB 11.3* 12.5* 11.9* 11.7* 12.5*  HCT 36.1* 39.2 38.0* 37.5* 40.6  MCV 94.3 92.2 94.3 93.8 93.5  PLT 332 410* 356 385 458*   Basic Metabolic Panel: Recent Labs  Lab 04/30/23 0506 05/01/23 0511 05/03/23 0522 05/04/23 0500 05/05/23 0431 05/06/23 0743  NA 136 135 133* 137 136 133*  K 4.0 4.0 3.6 3.8 3.8 4.0  CL 100 100 99 101 101 99  CO2 27 27 25 24 26 25   GLUCOSE 131* 112* 128* 122* 115* 107*  BUN 20 15 13 14 17 15   CREATININE 1.00 0.88 0.79 0.75 0.81 0.75  CALCIUM 8.5* 8.3* 8.6* 8.7* 8.2* 8.5*  MG 2.1 1.9 2.1 1.9 2.0  --   PHOS  --   --  3.6  --   3.9  --    GFR: Estimated Creatinine Clearance: 123.9 mL/min (by C-G formula based on SCr of 0.75 mg/dL). Liver Function Tests: Recent Labs  Lab 05/01/23 0511 05/03/23 0522 05/04/23 0500 05/05/23 0431 05/06/23 0743  AST 14* 15 15 13* 10*  ALT 14 13 13 13 14   ALKPHOS 72 83 72 73 79  BILITOT 0.4 0.3 0.5 0.3 0.6  PROT 6.1* 7.0 6.1* 6.2* 7.2  ALBUMIN 2.9* 3.5 3.0* 2.9* 3.6   No results for input(s): "LIPASE", "AMYLASE" in the last 168 hours. No results for input(s): "AMMONIA" in the last 168 hours. Coagulation Profile: No results for input(s): "INR", "PROTIME" in the last 168 hours. Cardiac Enzymes: No results for input(s): "  CKTOTAL", "CKMB", "CKMBINDEX", "TROPONINI" in the last 168 hours. BNP (last 3 results) No results for input(s): "PROBNP" in the last 8760 hours. HbA1C: No results for input(s): "HGBA1C" in the last 72 hours. CBG: Recent Labs  Lab 05/04/23 1132 05/04/23 1641 05/04/23 2151 05/05/23 0738 05/05/23 1142  GLUCAP 146* 173* 170* 120* 169*   Lipid Profile: No results for input(s): "CHOL", "HDL", "LDLCALC", "TRIG", "CHOLHDL", "LDLDIRECT" in the last 72 hours. Thyroid Function Tests: No results for input(s): "TSH", "T4TOTAL", "FREET4", "T3FREE", "THYROIDAB" in the last 72 hours. Anemia Panel: No results for input(s): "VITAMINB12", "FOLATE", "FERRITIN", "TIBC", "IRON", "RETICCTPCT" in the last 72 hours. Sepsis Labs: Recent Labs  Lab 05/03/23 0522  PROCALCITON <0.10    Recent Results (from the past 240 hours)  SARS Coronavirus 2 by RT PCR (hospital order, performed in [facility] hospital lab) *cepheid single result test* Anterior Nasal Swab     Status: None   Collection Time: 04/27/23  2:07 PM   Specimen: Anterior Nasal Swab  Result Value Ref Range Status   SARS Coronavirus 2 by RT PCR NEGATIVE NEGATIVE Final    Comment: (NOTE) SARS-CoV-2 target nucleic acids are NOT DETECTED.  The SARS-CoV-2 RNA is generally detectable in upper and  lower respiratory specimens during the acute phase of infection. The lowest concentration of SARS-CoV-2 viral copies this assay can detect is 250 copies / mL. A negative result does not preclude SARS-CoV-2 infection and should not be used as the sole basis for treatment or other patient management decisions.  A negative result may occur with improper specimen collection / handling, submission of specimen other than nasopharyngeal swab, presence of viral mutation(s) within the areas targeted by this assay, and inadequate number of viral copies (<250 copies / mL). A negative result must be combined with clinical observations, patient history, and epidemiological information.  Fact Sheet for Patients:   RoadLapTop.co.za  Fact Sheet for Healthcare Providers: http://kim-miller.com/  This test is not yet approved or  cleared by the [place] FDA and has been authorized for detection and/or diagnosis of SARS-CoV-2 by FDA under an Emergency Use Authorization (EUA).  This EUA will remain in effect (meaning this test can be used) for the duration of the COVID-19 declaration under Section 564(b)(1) of the Act, 21 U.S.C. section 360bbb-3(b)(1), unless the authorization is terminated or revoked sooner.  Performed at [facility], [address] [place], [place] [address]   Resp panel by RT-PCR (RSV, Flu A&B, Covid) Anterior Nasal Swab     Status: None   Collection Time: 04/28/23  9:41 PM   Specimen: Anterior Nasal Swab  Result Value Ref Range Status   SARS Coronavirus 2 by RT PCR NEGATIVE NEGATIVE Final    Comment: (NOTE) SARS-CoV-2 target nucleic acids are NOT DETECTED.  The SARS-CoV-2 RNA is generally detectable in upper respiratory specimens during the acute phase of infection. The lowest concentration of SARS-CoV-2 viral copies this assay can detect is 138 copies/mL. A negative result does not preclude  SARS-Cov-2 infection and should not be used as the sole basis for treatment or other patient management decisions. A negative result may occur with  improper specimen collection/handling, submission of specimen other than nasopharyngeal swab, presence of viral mutation(s) within the areas targeted by this assay, and inadequate number of viral copies(<138 copies/mL). A negative result must be combined with clinical observations, patient history, and epidemiological information. The expected result is Negative.  Fact Sheet for Patients:  BloggerCourse.com  Fact Sheet for Healthcare Providers:  SeriousBroker.it  This test is no t yet approved or cleared by the [place] and  has been authorized for detection and/or diagnosis of SARS-CoV-2 by FDA under an Emergency Use Authorization (EUA). This EUA will remain  in effect (meaning this test can be used) for the duration of the COVID-19 declaration under Section 564(b)(1) of the Act, 21 U.S.C.section 360bbb-3(b)(1), unless the authorization is terminated  or revoked sooner.       Influenza A by PCR NEGATIVE NEGATIVE Final   Influenza B by PCR NEGATIVE NEGATIVE Final    Comment: (NOTE) The Xpert Xpress SARS-CoV-2/FLU/RSV plus assay is intended as an aid in the diagnosis of influenza from Nasopharyngeal swab specimens and should not be used as a sole basis for treatment. Nasal washings and aspirates are unacceptable for Xpert Xpress SARS-CoV-2/FLU/RSV testing.  Fact Sheet for Patients: BloggerCourse.com  Fact Sheet for Healthcare Providers: SeriousBroker.it  This test is not yet approved or cleared by the [place] FDA and has been authorized for detection and/or diagnosis of SARS-CoV-2 by FDA under an Emergency Use Authorization (EUA). This EUA will remain in effect (meaning this test can be used) for the duration of  the COVID-19 declaration under Section 564(b)(1) of the Act, 21 U.S.C. section 360bbb-3(b)(1), unless the authorization is terminated or revoked.     Resp Syncytial Virus by PCR NEGATIVE NEGATIVE Final    Comment: (NOTE) Fact Sheet for Patients: BloggerCourse.com  Fact Sheet for Healthcare Providers: SeriousBroker.it  This test is not yet approved or cleared by the [place] FDA and has been authorized for detection and/or diagnosis of SARS-CoV-2 by FDA under an Emergency Use Authorization (EUA). This EUA will remain in effect (meaning this test can be used) for the duration of the COVID-19 declaration under Section 564(b)(1) of the Act, 21 U.S.C. section 360bbb-3(b)(1), unless the authorization is terminated or revoked.  Performed at [facility], [address] [place], [place] [address]     Radiology Studies: DG Chest 2 View Result Date: 05/05/2023 CLINICAL DATA:  Chest pain and dyspnea EXAM: CHEST - 2 VIEW COMPARISON:  05/02/2023 chest radiograph. FINDINGS: Stable cardiomediastinal silhouette with normal heart size. No pneumothorax. Hyperinflated lungs. Small bilateral pleural effusions are unchanged. Prominent patchy opacity throughout the left lung, slightly improved. No change in streaky patchy right lung base opacity. IMPRESSION: 1. Prominent patchy opacity throughout the left lung, slightly improved, and stable streaky patchy right lung base opacity, compatible with slightly improving multilobar pneumonia. Continued chest radiograph follow-up advised to document resolution. 2. Unchanged small bilateral pleural effusions. 3. Hyperinflated lungs, suggesting COPD. Electronically Signed   By: Delbert Phenix M.D.   On: 05/05/2023 21:20   Scheduled Meds:  arformoterol  15 mcg Nebulization BID   And   umeclidinium bromide  1 puff Inhalation Daily   buPROPion  300 mg Oral q morning   busPIRone  15 mg Oral Daily    cholecalciferol  1,000 Units Oral Daily   cycloSPORINE  1 drop Both Eyes Q12H   diltiazem  180 mg Oral Daily   docusate sodium  200 mg Oral BID   DULoxetine  60 mg Oral BID   enoxaparin (LOVENOX) injection  60 mg Subcutaneous Q24H   gabapentin  100 mg Oral TID   hydrALAZINE  25 mg Oral BID   melatonin  6 mg Oral QHS   Oxcarbazepine  600 mg Oral TID   pantoprazole  40 mg Oral Daily   predniSONE  50 mg Oral Q  breakfast   psyllium  1 packet Oral Daily   tamsulosin  0.8 mg Oral Daily   traZODone  75 mg Oral QHS   Continuous Infusions:   LOS: 1 day    Time spent: 50 mins    Willeen Niece, MD [facility]   If 7PM-7AM, please contact night-coverage

## 2023-05-06 NOTE — Evaluation (Signed)
 Physical Therapy Evaluation Patient Details Name: Mario [name] MRN: [id number] DOB: 1950-09-03 Today's Date: 05/06/2023  History of Present Illness  Mario [name] is a 73 y.o. male returns from parking lot of hospital with shortness of breath, admitted 05/05/23. Of note, ecurrent hospitalization 2/28-3/7 with acute on chronic hypoxic respiratory failure secondary to right lower lobe pneumonia and COPD exacerbation, suicidal ideation, multiple medical problems.    PMH: shortness of breath as a result of COPD, lung cancer, hx of PE  Clinical Impression  Pt admitted with above diagnosis. Pt reports multiple readmissions, reports decline started with RSV and feels like all he does is catch his breath now, reports no AD at baseline, completes self care and household tasks with seated rest breaks, does have senior companion who assists with microwave meals, light household chores and companionship. Pt reports on 5L O2 at baseline, monitors O2 and HR With personal pulse ox. On eval, pt seated EOB, completes transfers without AD ind, amb around bed and room 2x while managing o2 tubing, noted to fatigue and SpO2 desat to 86% post ambulation. Pt too fatigued to mobilize out of room into hallway due to needing seated rest location. Educated pt on pursed lip breathing, frequent walks with nursing and therapy to improve activity tolerance as able, time OOB in recliner or sitting EOB as able and pt verbalizes understanding. Recommend HHPT at d/c. Pt currently with functional limitations due to the deficits listed below (see PT Problem List). Pt will benefit from acute skilled PT to increase their independence and safety with mobility to allow discharge.           If plan is discharge home, recommend the following: Help with stairs or ramp for entrance;Assistance with cooking/housework;Assist for transportation   Can travel by private vehicle        Equipment Recommendations None recommended by PT   Recommendations for Other Services       Functional Status Assessment Patient has had a recent decline in their functional status and demonstrates the ability to make significant improvements in function in a reasonable and predictable amount of time.     Precautions / Restrictions Precautions Precautions: Fall Recall of Precautions/Restrictions: Intact Precaution/Restrictions Comments: Monitor O2, 5L O2 baseline Restrictions Weight Bearing Restrictions Per Provider Order: No      Mobility  Bed Mobility               General bed mobility comments: sitting EOB at arrival    Transfers Overall transfer level: Modified independent Equipment used: None               General transfer comment: mod ind with STS from EOB, able to maneuver o2 tubing appropriately, BUE assisting to power up as needed    Ambulation/Gait Ambulation/Gait assistance: Supervision Gait Distance (Feet): 50 Feet Assistive device: None Gait Pattern/deviations: Step-through pattern, Decreased stride length       General Gait Details: step through gait pattern, pt walking back and forth around bed in room, managing O2 tubing appropriately, pursed lip breathing noted, on 5L with SpO2 desat to 86% after while seated rest break but improves to 94% with seated pursed lip breathing  Stairs            Wheelchair Mobility     Tilt Bed    Modified Rankin (Stroke Patients Only)       Balance Overall balance assessment: No apparent balance deficits (not formally assessed)  Pertinent Vitals/Pain Pain Assessment Pain Assessment: 0-10 Pain Score: 4  Pain Location: R shoulder Pain Descriptors / Indicators: Constant Pain Intervention(s): Limited activity within patient's tolerance, Monitored during session, Premedicated before session    Home Living Family/patient expects to be discharged to:: Private residence Living  Arrangements: Non-relatives/Friends (roommate) Available Help at Discharge: Friend(s) Type of Home: House Home Access: Level entry     Alternate Level Stairs-Number of Steps: Split Level : enter on main level with kitchen/living  up 5 steps to bedroom and bathroom   Home Equipment: Cane - single point;Shower Counsellor (2 wheels);Rollator (4 wheels);Other (comment);Hand held shower head Additional Comments: Wears 5 L O2 at baseline    Prior Function Prior Level of Function : Independent/Modified Independent             Mobility Comments: Ambulates in home; gets SOB easily, seated rest breaks to recover; walks without AD ADLs Comments: Independent with ADLs; has assist with IADLs; senior helper 3 days/week assists with microwave meals, light cleaning, companionship     Extremity/Trunk Assessment   Upper Extremity Assessment Upper Extremity Assessment: Overall WFL for tasks assessed    Lower Extremity Assessment Lower Extremity Assessment: Overall WFL for tasks assessed    Cervical / Trunk Assessment Cervical / Trunk Assessment: Normal  Communication   Communication Communication: No apparent difficulties    Cognition Arousal: Alert Behavior During Therapy: WFL for tasks assessed/performed   PT - Cognitive impairments: No apparent impairments                                 Cueing       General Comments General comments (skin integrity, edema, etc.): pt on 5L with SpO2 94% at rest, desat to 86% post amb, improves to 95% with seated rest; HR 90s-110s with mobility    Exercises     Assessment/Plan    PT Assessment Patient needs continued PT services  PT Problem List Cardiopulmonary status limiting activity;Decreased activity tolerance;Decreased mobility;Pain       PT Treatment Interventions DME instruction;Gait training;Stair training;Functional mobility training;Therapeutic activities;Therapeutic exercise;Patient/family education    PT  Goals (Current goals can be found in the Care Plan section)  Acute Rehab PT Goals Patient Stated Goal: "not be short of breath" PT Goal Formulation: With patient Time For Goal Achievement: 05/20/23 Potential to Achieve Goals: Good    Frequency Min 2X/week     Co-evaluation               AM-PAC PT "6 Clicks" Mobility  Outcome Measure Help needed turning from your back to your side while in a flat bed without using bedrails?: None Help needed moving from lying on your back to sitting on the side of a flat bed without using bedrails?: None Help needed moving to and from a bed to a chair (including a wheelchair)?: None Help needed standing up from a chair using your arms (e.g., wheelchair or bedside chair)?: None Help needed to walk in hospital room?: A Little Help needed climbing 3-5 steps with a railing? : A Little 6 Click Score: 22    End of Session Equipment Utilized During Treatment: Oxygen Activity Tolerance: Patient tolerated treatment well Patient left: in bed;with call bell/phone within reach Nurse Communication: Mobility status PT Visit Diagnosis: Other abnormalities of gait and mobility (R26.89)    Time: 1610-9604 PT Time Calculation (min) (ACUTE ONLY): 16 min   Charges:   PT Evaluation $  PT Eval Low Complexity: 1 Low   PT General Charges $$ ACUTE PT VISIT: 1 Visit         Tori Riyaan Heroux PT, DPT 05/06/23, 10:23 AM

## 2023-05-06 NOTE — H&P (Addendum)
 History and Physical    Patient: Mario [name]:[id number] DOB: [birth year] DOA: 05/05/2023 DOS: the patient was seen and examined on 05/05/2023 PCP: [name]  Patient coming from: Home  Chief Complaint:  Chief Complaint  Patient presents with   Shortness of Breath   HPI: GOTHAM RADEN is a 73 y.o. male with medical history significant of shortness of breath as a result of COPD, lung cancer, who has had recurrent hospitalization with acute on chronic hypoxic respiratory failure secondary to right lower lobe pneumonia and COPD exacerbation, suicidal ideation, multiple medical problems.  Patient was admitted and discharged today on oxygen.  He was discharged after several attempts and as patient was going home at the parking lot apparently he got more short of breath and return to the ER.  Patient reported that he has his concentrator going only to 5 L.  On room air he is on 4 L.  When he arrived the ER his heart rate was in the 150s.  He was placed on 6 L of oxygen and then being admitted to the medical service.  Currently he is stable on 4 L at rest.  He goes to the [place] and insist that the [place] will not approve concentrator that is more than 5 L/min.  He is scared of going home.  He has had chronic respiratory failure and appears to be at his baseline earlier in the day.  We are admitting the patient for evaluation and reassess his care plan and discharge planning.  Review of Systems: As mentioned in the history of present illness. All other systems reviewed and are negative. Past Medical History:  Diagnosis Date   Ankylosing spondylitis lumbar region [facility]) 09/25/2022   Anxiety    Bronchitis    COPD (chronic obstructive pulmonary disease) (HCC)    Depression    History of radiation therapy    Left lung- 10/05/20-10/15/20- Dr. Antony Blackbird   Hypertension    lung ca 09/2020   MI (myocardial infarction) [facility])    ????   On home oxygen therapy    4L/min [place]   OSA  (obstructive sleep apnea)    Suicide attempt (HCC)    Tension pneumothorax 06/27/2016   Uveitis    Past Surgical History:  Procedure Laterality Date   BIOPSY  07/03/2021   Procedure: BIOPSY;  Surgeon: Kathi Der, MD;  Location: [facility] ENDOSCOPY;  Service: Gastroenterology;;   BRONCHIAL BIOPSY  07/30/2020   Procedure: BRONCHIAL BIOPSIES;  Surgeon: Josephine Igo, DO;  Location: [facility] ENDOSCOPY;  Service: Pulmonary;;   BRONCHIAL BRUSHINGS  07/30/2020   Procedure: BRONCHIAL BRUSHINGS;  Surgeon: Josephine Igo, DO;  Location: [facility] ENDOSCOPY;  Service: Pulmonary;;   BRONCHIAL NEEDLE ASPIRATION BIOPSY  07/30/2020   Procedure: BRONCHIAL NEEDLE ASPIRATION BIOPSIES;  Surgeon: Josephine Igo, DO;  Location: [facility] ENDOSCOPY;  Service: Pulmonary;;   BRONCHIAL WASHINGS  07/30/2020   Procedure: BRONCHIAL WASHINGS;  Surgeon: Josephine Igo, DO;  Location: [facility] ENDOSCOPY;  Service: Pulmonary;;   BRONCHIAL WASHINGS  04/17/2023   Procedure: BRONCHIAL WASHINGS;  Surgeon: Lorin Glass, MD;  Location: [facility] ENDOSCOPY;  Service: Pulmonary;;   CHEST TUBE INSERTION Left 06/27/2016   CRYOTHERAPY  04/17/2023   Procedure: CRYOTHERAPY;  Surgeon: Lorin Glass, MD;  Location: [facility] ENDOSCOPY;  Service: Pulmonary;;   cryptorchidism     ESOPHAGOGASTRODUODENOSCOPY N/A 07/03/2021   Procedure: ESOPHAGOGASTRODUODENOSCOPY (EGD);  Surgeon: Kathi Der, MD;  Location: [facility] ENDOSCOPY;  Service: Gastroenterology;  Laterality: N/A;   HEMOSTASIS  CONTROL  04/17/2023   Procedure: HEMOSTASIS CONTROL;  Surgeon: Lorin Glass, MD;  Location: [facility] ENDOSCOPY;  Service: Pulmonary;;   IR PERC PLEURAL DRAIN W/INDWELL CATH W/IMG GUIDE  08/04/2021   IR REMOVAL OF PLURAL CATH W/CUFF  09/01/2021   SKIN CANCER EXCISION     VIDEO BRONCHOSCOPY Left 04/17/2023   Procedure: VIDEO BRONCHOSCOPY WITHOUT FLUORO;  Surgeon: Lorin Glass, MD;  Location: [facility] ENDOSCOPY;  Service: Pulmonary;  Laterality: Left;   VIDEO BRONCHOSCOPY WITH ENDOBRONCHIAL NAVIGATION Left  07/30/2020   Procedure: VIDEO BRONCHOSCOPY WITH ENDOBRONCHIAL NAVIGATION;  Surgeon: Josephine Igo, DO;  Location: [facility] ENDOSCOPY;  Service: Pulmonary;  Laterality: Left;   Social History:  reports that he quit smoking about 6 years ago. His smoking use included cigarettes. He started smoking about 41 years ago. He has a 35 pack-year smoking history. He has been exposed to tobacco smoke. He has never used smokeless tobacco. He reports that he does not drink alcohol and does not use drugs.  Allergies  Allergen Reactions   Demerol [Meperidine] Nausea And Vomiting and Other (See Comments)    Made the patient "violently sick"   Zocor [Simvastatin] Nausea And Vomiting and Other (See Comments)    Made him very jittery, also   Beet [Beta Vulgaris] Nausea And Vomiting   Liver Nausea And Vomiting    Any Liver    Family History  Problem Relation Age of Onset   Dementia Father     Prior to Admission medications   Medication Sig Start Date End Date Taking? Authorizing Provider  acetaminophen (TYLENOL) 500 MG tablet Take 1,000 mg by mouth every 6 (six) hours as needed for mild pain (pain score 1-3) or moderate pain (pain score 4-6).    [provider]  albuterol (PROVENTIL) (2.5 MG/3ML) 0.083% nebulizer solution Take 2.5 mg by nebulization every 6 (six) hours as needed for wheezing or shortness of breath.    [provider]  albuterol (VENTOLIN HFA) 108 (90 Base) MCG/ACT inhaler Inhale 2 puffs into the lungs every 4 (four) hours as needed for wheezing or shortness of breath. 11/04/21   Glade Lloyd, MD  azelastine (ASTELIN) 0.1 % nasal spray Place 1 spray into both nostrils 2 (two) times daily as needed for allergies. Use in each nostril as directed    [provider]  bisacodyl (DULCOLAX) 5 MG EC tablet Take 2 tablets (10 mg total) by mouth daily as needed for moderate constipation. 01/26/23   Arby Barrette, MD  buPROPion (WELLBUTRIN XL) 300 MG 24 hr tablet Take 300 mg by  mouth every morning. 02/05/20   [provider]  busPIRone (BUSPAR) 15 MG tablet Take 15 mg by mouth in the morning.    [provider]  Carboxymethylcellulose Sod PF 0.5 % SOLN Place 1 drop into both eyes 4 (four) times daily as needed (dry eyes).    [provider]  cholecalciferol (VITAMIN D3) 25 MCG (1000 UNIT) tablet Take 1,000 Units by mouth daily.    [provider]  cyclobenzaprine (FLEXERIL) 10 MG tablet Take 10 mg by mouth at bedtime as needed for muscle spasms.    [provider]  cycloSPORINE (RESTASIS) 0.05 % ophthalmic emulsion Place 1 drop into both eyes every 12 (twelve) hours.    [provider]  diltiazem (CARDIZEM CD) 180 MG 24 hr capsule Take 180 mg by mouth in the morning.    [provider]  docusate sodium (COLACE) 100 MG capsule Take 200 mg by mouth  2 (two) times daily.    [provider]  DULoxetine (CYMBALTA) 60 MG capsule Take 60 mg by mouth 2 (two) times daily.    [provider]  gabapentin (NEURONTIN) 100 MG capsule Take 100 mg by mouth 3 (three) times daily.    [provider]  hydrALAZINE (APRESOLINE) 25 MG tablet Take 1 tablet (25 mg total) by mouth 2 (two) times daily. 04/24/23   Alwyn Ren, MD  HYDROcodone-acetaminophen [facility]) 10-325 MG tablet Take 1 tablet by mouth every 6 (six) hours as needed for moderate pain (pain score 4-6).    [provider]  LACTULOSE PO Take 2 Scoops by mouth 2 (two) times daily as needed. 2 tablespoonfuls    [provider]  Melatonin 3 MG CAPS Take 6 mg by mouth at bedtime.    [provider]  omeprazole (PRILOSEC) 20 MG capsule Take 1 capsule (20 mg total) by mouth daily. Patient taking differently: Take 20 mg by mouth daily as needed. 01/26/23   Arby Barrette, MD  Oxcarbazepine (TRILEPTAL) 300 MG tablet Take 600 mg by mouth in the morning, at noon, and at bedtime.    [provider]  Oxycodone HCl 10  MG TABS Take 1 tablet (10 mg total) by mouth 2 (two) times daily as needed for moderate pain (pain score 4-6) or severe pain (pain score 7-10). 04/24/23   Alwyn Ren, MD  OXYGEN Inhale 5 L/min into the lungs daily.    [provider]  predniSONE (DELTASONE) 10 MG tablet Take 5 tablets (50 mg total) by mouth daily with breakfast for 3 days, THEN 4 tablets (40 mg total) daily with breakfast for 3 days, THEN 3 tablets (30 mg total) daily with breakfast for 3 days, THEN 2 tablets (20 mg total) daily with breakfast for 3 days, THEN 1 tablet (10 mg total) daily with breakfast for 3 days. 05/06/23 05/21/23  Marinda Elk, MD  psyllium (HYDROCIL/METAMUCIL) 95 % PACK Take 1 packet by mouth daily. Patient not taking: Reported on 04/26/2023 04/02/23   Leatha Gilding, MD  sodium chloride (OCEAN) 0.65 % SOLN nasal spray Place 1 spray into both nostrils daily at 2 am. Patient taking differently: Place 1 spray into both nostrils daily as needed for congestion. 04/24/23   Alwyn Ren, MD  tamsulosin (FLOMAX) 0.4 MG CAPS capsule Take 0.8 mg by mouth daily.    [provider]  Tiotropium Bromide-Olodaterol 2.5-2.5 MCG/ACT AERS Inhale 2 each into the lungs every morning. 2 puffs    [provider]  traZODone (DESYREL) 50 MG tablet Take 75 mg by mouth at bedtime.    [provider]    Physical Exam: Vitals:   05/05/23 1622 05/05/23 1922 05/05/23 2000 05/05/23 2348  BP: (!) 154/96 (!) 151/86  (!) 147/83  Pulse: (!) 129 (!) 110  99  Resp: 18 18  17   Temp: 98.1 F (36.7 C) 98.3 F (36.8 C)  97.9 F (36.6 C)  TempSrc:  Oral  Oral  SpO2: 92% 92%  91%  Weight:   135.3 kg   Height:   6\' 2"  (1.88 m)    Constitutional: Acutely ill looking, NAD, calm, comfortable Eyes: PERRL, lids and conjunctivae normal ENMT: Mucous membranes are moist. Posterior pharynx clear of any exudate or lesions.Normal dentition.  Neck: normal, supple, no masses, no  thyromegaly Respiratory: Decreased air entry bilaterally with some mild expiratory wheezing no crackles. Normal respiratory effort. No accessory muscle use.  Cardiovascular: Sinus  tachycardia, no murmurs / rubs / gallops. No extremity edema. 2+ pedal pulses. No carotid bruits.  Abdomen: no tenderness, no masses palpated. No hepatosplenomegaly. Bowel sounds positive.  Musculoskeletal: Good range of motion, no joint swelling or tenderness, Skin: no rashes, lesions, ulcers. No induration Neurologic: CN 2-12 grossly intact. Sensation intact, DTR normal. Strength 5/5 in all 4.  Psychiatric: Normal judgment and insight. Alert and oriented x 3.  Anxious mood  Data Reviewed:  Temperature 98.3, blood pressure 144/68, pulse 129, respirate of 18 oxygen sat 88% on 2 L 91% on 4 L hemoglobin 11.7 glucose 115 and calcium 8.2. Chest x-ray showed prominent patchy opacity throughout the left lung slightly improved stable streaky patchy right lung base opacity compatible with improving multilobar pneumonia.  There is unchanged small bilateral pleural effusions.  Assessment and Plan:  #1 acute on chronic hypoxic respiratory failure: Patient still has remnants of pneumonia with COPD exacerbation.  It appears patient also has higher oxygen demand than his concentrator can provide.  Patient likely will need home oxygen that goes to at least 6 L or more when he has activities.  Patient readmitted.  Will attempt to come up with treatment plan that with enable patient stay home rather than multiple hospitalizations.  In the meantime resume steroid, nebulizer treatments and antibiotics.  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  Will continue with home regimen and counseling.  #3 multilobar pneumonia: Continue with antibiotics and supportive care.  #4 diabetes: Diet controlled.  Resume sliding scale insulin.   Continue to monitor  #5 essential hypertension: Patient on hydralazine and diltiazem.  Continue  #6 chronic pain syndrome: Resume home regimen of oxycodone.  #7 non-small cell cancer of the left lung: Continue follow-up with oncology at the [facility].  Currently not on treatment.  #8 history of pulmonary embolism: Resume chronic anticoagulation.  #9 chronic kidney disease stage IIIa: Appears to be at baseline.  Continue to monitor  #10 morbid obesity: Dietary counseling  #11 obstructive sleep apnea: Not on CPAP or BiPAP  #12 BPH: Continue Flomax.    Advance Care Planning:   Code Status: Full Code   Consults: None  Family Communication: No family at baseline  Severity of Illness: The appropriate patient status for this patient is INPATIENT. Inpatient status is judged to be reasonable and necessary in order to provide the required intensity of service to ensure the patient's safety. The patient's presenting symptoms, physical exam findings, and initial radiographic and laboratory data in the context of their chronic comorbidities is felt to place them at high risk for further clinical deterioration. Furthermore, it is not anticipated that the patient will be medically stable for discharge from the hospital within 2 midnights of admission.   * I certify that at the point of admission it is my clinical judgment that the patient will require inpatient hospital care spanning beyond 2 midnights from the point of admission due to high intensity of service, high risk for further deterioration and high frequency of surveillance required.*  AuthorLonia Blood, MD 05/05/2023 7:31 PM  For on call review [email or web address].

## 2023-05-07 DIAGNOSIS — J9621 Acute and chronic respiratory failure with hypoxia: Secondary | ICD-10-CM | POA: Diagnosis not present

## 2023-05-07 MED ORDER — ADULT MULTIVITAMIN W/MINERALS CH
1.0000 | ORAL_TABLET | Freq: Every day | ORAL | Status: DC
  Administered 2023-05-07 – 2023-05-16 (×10): 1 via ORAL
  Filled 2023-05-07 (×10): qty 1

## 2023-05-07 NOTE — Progress Notes (Signed)
 Mobility Specialist - Progress Note   05/07/23 1443  Oxygen Therapy  SpO2 (!) 86 %  O2 Device Nasal Cannula  O2 Flow Rate (L/min) 6 L/min  Patient Activity (if Appropriate) Ambulating  Mobility  Activity Ambulated independently in hallway  Level of Assistance Independent  Assistive Device None  Distance Ambulated (ft) 80 ft  Activity Response Tolerated well  Mobility Referral Yes  Mobility visit 1 Mobility  Mobility Specialist Start Time (ACUTE ONLY) 1427  Mobility Specialist Stop Time (ACUTE ONLY) 1444  Mobility Specialist Time Calculation (min) (ACUTE ONLY) 17 min   Pt received in bed and agreeable to mobility. Upon returning to room, pt desat to 86%. Encouraged pursed lip breaths bringing SpO2 to 92%. SOB with exertion. No complaints during session. Pt to EOB after session with all needs met.    Pre-mobility: 107 HR, 94% SpO2 (6L [place]) During mobility: 123 HR, 90% SpO2 (6L [place]) Post-mobility: 115 HR, 86-92% SPO2 (6L [place])  Chief Technology Officer

## 2023-05-07 NOTE — Progress Notes (Signed)
 PROGRESS NOTE    Mario [name]  ZOX:[id number] DOB: 04-Jul-1950 DOA: 05/05/2023 PCP: [name]   Brief Narrative:  This 73 y.o. male with medical history significant of COPD, lung cancer, Chronic shortness of breath at exertion , Who has had recurrent hospitalizations with acute on chronic hypoxic respiratory failure secondary to right lower lobe pneumonia and COPD exacerbation, suicidal ideation, multiple medical problems.  Patient was admitted for above and discharged 05/05/23 on oxygen.  He was discharged after several attempts and as patient was going home,  at the parking lot apparently he got more short of breath and returned to the ER.  Patient reported that he has his concentrator going only upto 5 L.  On room air , he is on 4 L.  When he arrived the ER his heart rate was in the 150s.  He was placed on 6 L of oxygen and then being admitted to the medical service.  Currently he is stable on 4 L at rest.  He goes to the [place] and insist that the [place] will not approve concentrator that is more than 5 L/min.  He is scared of going home.  He has had chronic respiratory failure and appears to be at his baseline earlier in the day.  Patient was readmitted and needs proper discharge planning.  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   It appears patient also has higher oxygen demand than his concentrator can provide.   Patient likely will need home oxygen that goes to at least 6 L or more when he has exertion.   Patient readmitted.  Will attempt to come up with treatment plan that with enable patient stay home rather than multiple hospitalizations.   In the meantime resumed steroids, nebulizer treatments and antibiotics. Patient seems much improved, back to his oxygen requirement baseline.   Adjustment disorder with mixed disturbance of emotions and conduct:  This may be playing a major role in patient's decision and recurrent hospitalization.   Will continue with home regimen and counseling.   Multilobar pneumonia:  Continue with antibiotics and supportive care.   Diabetes Mellitus II : Diet controlled.   Resume sliding scale insulin.  Continue to monitor   Essential hypertension:  Continue hydralazine and diltiazem.     Chronic pain syndrome:  Resume home regimen of oxycodone.   Non-small cell cancer of the left lung:  Continue follow-up with oncology at the [facility].  Currently not on treatment.   History of pulmonary embolism:  Resume chronic anticoagulation.   Chronic kidney disease stage IIIa:  Appears to be at baseline.  Continue to monitor   Morbid obesity: Dietary counseling.   Obstructive sleep apnea: Not on CPAP or BiPAP   BPH: Continue Flomax.    DVT prophylaxis:  Lovenox Code Status: Full code Family Communication: No family at bed side. Disposition Plan:    Status is: Inpatient Remains inpatient appropriate because:   Readmitted with worsening shortness of breath.  TOC is aware that he needs oxygen with higher concentrator.    Consultants:  None  Procedures: None Antimicrobials: Anti-infectives (From admission, onward)    None       Subjective: Patient seen and examined at bedside.  Overnight events noted. Patient is morbidly  obese , reports feeling improved. Patient remains on 5 L of supplemental oxygen.  Objective: Vitals:   05/06/23 1830 05/06/23 2040 05/07/23 0508 05/07/23 0853  BP:  139/85 (!) 145/81   Pulse:  99 83   Resp:  18 18   Temp:  98.2 F (36.8 C) 97.6 F (36.4 C)   TempSrc:  Oral Oral   SpO2: 97% 94% 95% 99%  Weight:      Height:        Intake/Output Summary (Last 24 hours) at 05/07/2023 1224 Last data filed at 05/07/2023 0851 Gross per 24 hour  Intake 480 ml  Output 1075 ml  Net -595 ml   Filed Weights   05/05/23 2000  Weight: 135.3 kg    Examination:  General exam: Appears comfortable, morbidly obese, not in any acute distress. Respiratory system: CTA Bilaterally . Respiratory effort normal.  RR 14 Cardiovascular system: S1 & S2 heard, RRR. No JVD, murmurs, rubs, gallops or clicks.  Gastrointestinal system: Abdomen is non distended, soft and non tender. Normal bowel sounds heard. Central nervous system: Alert and oriented X 3. No focal neurological deficits. Extremities: No edema, No cyanosis, No clubbing Skin: No rashes, lesions or ulcers Psychiatry: Judgement and insight appear normal. Mood & affect appropriate.   Data Reviewed: I have personally reviewed following labs and imaging studies  CBC: Recent Labs  Lab 05/01/23 0511 05/03/23 0522 05/04/23 0500 05/05/23 0431 05/06/23 0743  WBC 8.5 9.2 7.9 8.3 10.8*  NEUTROABS  --   --  4.8 5.0  --   HGB 11.3* 12.5* 11.9* 11.7* 12.5*  HCT 36.1* 39.2 38.0* 37.5* 40.6  MCV 94.3 92.2 94.3 93.8 93.5  PLT 332 410* 356 385 458*   Basic Metabolic Panel: Recent Labs  Lab 05/01/23 0511 05/03/23 0522 05/04/23 0500 05/05/23 0431 05/06/23 0743  NA 135 133* 137 136 133*  K 4.0 3.6 3.8 3.8 4.0  CL 100 99 101 101 99  CO2 27 25 24 26 25   GLUCOSE 112* 128* 122* 115* 107*  BUN 15 13 14 17 15   CREATININE 0.88 0.79 0.75 0.81 0.75  CALCIUM 8.3* 8.6* 8.7* 8.2* 8.5*  MG 1.9 2.1 1.9 2.0  --   PHOS  --  3.6  --  3.9  --     GFR: Estimated Creatinine Clearance: 123.9 mL/min (by C-G formula based on SCr of 0.75 mg/dL). Liver Function Tests: Recent Labs  Lab 05/01/23 0511 05/03/23 0522 05/04/23 0500 05/05/23 0431 05/06/23 0743  AST 14* 15 15 13* 10*  ALT 14 13 13 13 14   ALKPHOS 72 83 72 73 79  BILITOT 0.4 0.3 0.5 0.3 0.6  PROT 6.1* 7.0 6.1* 6.2* 7.2  ALBUMIN 2.9* 3.5 3.0* 2.9* 3.6   No results for input(s): "LIPASE", "AMYLASE" in the last 168 hours. No results for input(s): "AMMONIA" in the last 168 hours. Coagulation Profile: No results for input(s): "INR", "PROTIME" in the last 168 hours. Cardiac Enzymes: No results for input(s): "CKTOTAL", "CKMB", "CKMBINDEX", "TROPONINI" in the last 168  hours. BNP (last 3 results) No results for input(s): "PROBNP" in the last 8760 hours. HbA1C: No results for input(s): "HGBA1C" in the last 72 hours. CBG: Recent Labs  Lab 05/04/23 1132 05/04/23 1641 05/04/23 2151 05/05/23 0738 05/05/23 1142  GLUCAP 146* 173* 170* 120* 169*   Lipid Profile: No results for input(s): "CHOL", "HDL", "LDLCALC", "TRIG", "CHOLHDL", "LDLDIRECT" in the last 72 hours. Thyroid Function Tests: No results for input(s): "TSH", "T4TOTAL", "FREET4", "T3FREE", "THYROIDAB" in the last 72 hours. Anemia Panel: No results for input(s): "VITAMINB12", "FOLATE", "FERRITIN", "TIBC", "IRON", "RETICCTPCT" in the last 72 hours. Sepsis Labs: Recent Labs  Lab 05/03/23 0522  PROCALCITON <0.10    Recent Results (from the past 240 hours)  SARS Coronavirus 2 by RT PCR (hospital order, performed in [facility] hospital lab) *cepheid single result test* Anterior Nasal Swab     Status: None   Collection Time: 04/27/23  2:07 PM   Specimen: Anterior Nasal Swab  Result Value Ref Range Status   SARS Coronavirus 2 by RT PCR NEGATIVE NEGATIVE Final    Comment: (NOTE) SARS-CoV-2 target nucleic acids are NOT DETECTED.  The SARS-CoV-2 RNA is generally detectable in upper and lower respiratory  specimens during the acute phase of infection. The lowest concentration of SARS-CoV-2 viral copies this assay can detect is 250 copies / mL. A negative result does not preclude SARS-CoV-2 infection and should not be used as the sole basis for treatment or other patient management decisions.  A negative result may occur with improper specimen collection / handling, submission of specimen other than nasopharyngeal swab, presence of viral mutation(s) within the areas targeted by this assay, and inadequate number of viral copies (<250 copies / mL). A negative result must be combined with clinical observations, patient history, and epidemiological information.  Fact Sheet for Patients:   RoadLapTop.co.za  Fact Sheet for Healthcare Providers: http://kim-miller.com/  This test is not yet approved or  cleared by the [place] FDA and has been authorized for detection and/or diagnosis of SARS-CoV-2 by FDA under an Emergency Use Authorization (EUA).  This EUA will remain in effect (meaning this test can be used) for the duration of the COVID-19 declaration under Section 564(b)(1) of the Act, 21 U.S.C. section 360bbb-3(b)(1), unless the authorization is terminated or revoked sooner.  Performed at [facility], [address] [place], [place] [address]   Resp panel by RT-PCR (RSV, Flu A&B, Covid) Anterior Nasal Swab     Status: None   Collection Time: 04/28/23  9:41 PM   Specimen: Anterior Nasal Swab  Result Value Ref Range Status   SARS Coronavirus 2 by RT PCR NEGATIVE NEGATIVE Final    Comment: (NOTE) SARS-CoV-2 target nucleic acids are NOT DETECTED.  The SARS-CoV-2 RNA is generally detectable in upper respiratory specimens during the acute phase of infection. The lowest concentration of SARS-CoV-2 viral copies this assay can detect is 138 copies/mL. A negative result does not preclude SARS-Cov-2 infection and should  not be used as the sole basis for treatment or other patient management decisions. A negative result may occur with  improper specimen collection/handling, submission of specimen other than nasopharyngeal swab, presence of viral mutation(s) within the areas targeted by this assay, and inadequate number of viral copies(<138 copies/mL). A negative result must be combined with clinical observations, patient history, and epidemiological information. The expected result is Negative.  Fact Sheet for Patients:  BloggerCourse.com  Fact Sheet for Healthcare Providers:  SeriousBroker.it  This test is no t  yet approved or cleared by the [place] and  has been authorized for detection and/or diagnosis of SARS-CoV-2 by FDA under an Emergency Use Authorization (EUA). This EUA will remain  in effect (meaning this test can be used) for the duration of the COVID-19 declaration under Section 564(b)(1) of the Act, 21 U.S.C.section 360bbb-3(b)(1), unless the authorization is terminated  or revoked sooner.       Influenza A by PCR NEGATIVE NEGATIVE Final   Influenza B by PCR NEGATIVE NEGATIVE Final    Comment: (NOTE) The Xpert Xpress SARS-CoV-2/FLU/RSV plus assay is intended as an aid in the diagnosis of influenza from Nasopharyngeal swab specimens and should not be used as a sole basis for treatment. Nasal washings and aspirates are unacceptable for Xpert Xpress SARS-CoV-2/FLU/RSV testing.  Fact Sheet for Patients: BloggerCourse.com  Fact Sheet for Healthcare Providers: SeriousBroker.it  This test is not yet approved or cleared by the [place] FDA and has been authorized for detection and/or diagnosis of SARS-CoV-2 by FDA under an Emergency Use Authorization (EUA). This EUA will remain in effect (meaning this test can be used) for the duration of the COVID-19 declaration under  Section 564(b)(1) of the Act, 21 U.S.C. section 360bbb-3(b)(1), unless the authorization is terminated or revoked.     Resp Syncytial Virus by PCR NEGATIVE NEGATIVE Final    Comment: (NOTE) Fact Sheet for Patients: BloggerCourse.com  Fact Sheet for Healthcare Providers: SeriousBroker.it  This test is not yet approved or cleared by the [place] FDA and has been authorized for detection and/or diagnosis of SARS-CoV-2 by FDA under an Emergency Use Authorization (EUA). This EUA will remain in effect (meaning this test can be used) for the duration of the COVID-19 declaration under Section 564(b)(1) of the Act, 21 U.S.C. section 360bbb-3(b)(1), unless the authorization is terminated or revoked.  Performed at [facility], [address], [place], [place] [address]     Radiology Studies: DG Chest 2 View Result Date: 05/05/2023 CLINICAL DATA:  Chest pain and dyspnea EXAM: CHEST - 2 VIEW COMPARISON:  05/02/2023 chest radiograph. FINDINGS: Stable cardiomediastinal silhouette with normal heart size. No pneumothorax. Hyperinflated lungs. Small bilateral pleural effusions are unchanged. Prominent patchy opacity throughout the left lung, slightly improved. No change in streaky patchy right lung base opacity. IMPRESSION: 1. Prominent patchy opacity throughout the left lung, slightly improved, and stable streaky patchy right lung base opacity, compatible with slightly improving multilobar pneumonia. Continued chest radiograph follow-up advised to document resolution. 2. Unchanged small bilateral pleural effusions. 3. Hyperinflated lungs, suggesting COPD. Electronically Signed   By: Delbert Phenix M.D.   On: 05/05/2023 21:20   Scheduled Meds:  arformoterol  15 mcg Nebulization BID   And   umeclidinium bromide  1 puff Inhalation Daily   buPROPion  300 mg Oral q morning   busPIRone  15 mg Oral Daily   cholecalciferol  1,000 Units  Oral Daily   cycloSPORINE  1 drop Both Eyes Q12H   diltiazem  180 mg Oral Daily   docusate sodium  200 mg Oral BID   DULoxetine  60 mg Oral BID   enoxaparin (LOVENOX) injection  60 mg Subcutaneous Q24H   gabapentin  100 mg Oral TID   hydrALAZINE  25 mg Oral BID   melatonin  6 mg Oral QHS   Oxcarbazepine  600 mg Oral TID   pantoprazole  40 mg Oral Daily   predniSONE  50 mg Oral Q breakfast   psyllium  1 packet  Oral Daily   tamsulosin  0.8 mg Oral Daily   traZODone  75 mg Oral QHS   Continuous Infusions:   LOS: 2 days    Time spent: 35 mins    Willeen Niece, MD [facility]   If 7PM-7AM, please contact night-coverage

## 2023-05-07 NOTE — Plan of Care (Signed)
 Plan of care and goals reviewed with patient, time given for questions, patient remains on oxygen via nasal canula at 5 liters, has no complaints of shortness of breath.  Problem: Education: Goal: Knowledge of General Education information will improve Description: Including pain rating scale, medication(s)/side effects and non-pharmacologic comfort measures Outcome: Progressing   Problem: Health Behavior/Discharge Planning: Goal: Ability to manage health-related needs will improve Outcome: Progressing   Problem: Clinical Measurements: Goal: Ability to maintain clinical measurements within normal limits will improve Outcome: Progressing Goal: Will remain free from infection Outcome: Progressing Goal: Diagnostic test results will improve Outcome: Progressing Goal: Respiratory complications will improve Outcome: Progressing Goal: Cardiovascular complication will be avoided Outcome: Progressing   Problem: Activity: Goal: Risk for activity intolerance will decrease Outcome: Progressing   Problem: Nutrition: Goal: Adequate nutrition will be maintained Outcome: Progressing   Problem: Coping: Goal: Level of anxiety will decrease Outcome: Progressing   Problem: Elimination: Goal: Will not experience complications related to bowel motility Outcome: Progressing Goal: Will not experience complications related to urinary retention Outcome: Progressing   Problem: Pain Managment: Goal: General experience of comfort will improve and/or be controlled Outcome: Progressing   Problem: Safety: Goal: Ability to remain free from injury will improve Outcome: Progressing   Problem: Skin Integrity: Goal: Risk for impaired skin integrity will decrease Outcome: Progressing   Problem: Education: Goal: Knowledge of disease or condition will improve Outcome: Progressing Goal: Knowledge of the prescribed therapeutic regimen will improve Outcome: Progressing Goal: Individualized Educational  Video(s) Outcome: Progressing   Problem: Activity: Goal: Ability to tolerate increased activity will improve Outcome: Progressing Goal: Will verbalize the importance of balancing activity with adequate rest periods Outcome: Progressing   Problem: Respiratory: Goal: Ability to maintain a clear airway will improve Outcome: Progressing Goal: Levels of oxygenation will improve Outcome: Progressing Goal: Ability to maintain adequate ventilation will improve Outcome: Progressing

## 2023-05-07 NOTE — Progress Notes (Signed)
 Initial Nutrition Assessment  DOCUMENTATION CODES:   Obesity unspecified  INTERVENTION:  Multivitamin w/ minerals daily Encourage good PO intake   NUTRITION DIAGNOSIS:   Increased nutrient needs related to chronic illness (COPD, Cancer) as evidenced by estimated needs.  GOAL:   Patient will meet greater than or equal to 90% of their needs  MONITOR:   PO intake, Labs, I & O's, Weight trends  REASON FOR ASSESSMENT:   Consult Assessment of nutrition requirement/status  ASSESSMENT:   73 y.o. male presented to the ED with SOB after being discharged earlier. PMH includes COPD, lung cancer, T2DM, HTN, CKD 3a, and BPH. Pt admitted for reevaluation of acute on chronic respiratory failure and ongoing discharge planning.   3/07 - Discharge; Readmitted   RD working remotely at time of assessment. Pt with multiple recent admissions over the past few months.  Discussed with RN, pt requested a regular diet and MD agreeable. RN liberalized pt diet. RN reports no complaints of nausea or vomiting.   Meal Intake 3/8: 100% x 2 meals 3/9: 100% x 1 meal  Per EMR, pt weight has fluctuated over the past 6 months. More recently, pt with a 4% weight loss in the past two months. Unsure if current weight is accurate or pulled from previous admission.   Medications reviewed and include: Vitamin D3, Colace, Melatonin, Protonix, Prednisone, Psyllium Labs reviewed.  NUTRITION - FOCUSED PHYSICAL EXAM: RD working remotely, deferred to follow-up.  Diet Order:   Diet Order             Diet regular Room service appropriate? Yes; Fluid consistency: Thin  Diet effective now                   EDUCATION NEEDS:   No education needs have been identified at this time  Skin:  Skin Assessment: Reviewed RN Assessment  Last BM:  3/8  Height:   Ht Readings from Last 1 Encounters:  05/05/23 6\' 2"  (1.88 m)    Weight:   Wt Readings from Last 1 Encounters:  05/05/23 135.3 kg   Ideal Body  Weight:  86.4 kg  BMI:  Body mass index is 38.3 kg/m.  Estimated Nutritional Needs:  Kcal:  2050-2250 Protein:  100-120 grams Fluid:  >/= 2 L   Kirby Crigler RD, LDN Clinical Dietitian

## 2023-05-07 NOTE — Plan of Care (Signed)
  Problem: Education: Goal: Knowledge of General Education information will improve Description: Including pain rating scale, medication(s)/side effects and non-pharmacologic comfort measures Outcome: Progressing   Problem: Health Behavior/Discharge Planning: Goal: Ability to manage health-related needs will improve Outcome: Progressing   Problem: Clinical Measurements: Goal: Ability to maintain clinical measurements within normal limits will improve Outcome: Progressing Goal: Will remain free from infection Outcome: Progressing Goal: Diagnostic test results will improve Outcome: Progressing Goal: Respiratory complications will improve Outcome: Progressing Goal: Cardiovascular complication will be avoided Outcome: Progressing   Problem: Activity: Goal: Risk for activity intolerance will decrease Outcome: Progressing   Problem: Nutrition: Goal: Adequate nutrition will be maintained Outcome: Progressing   Problem: Elimination: Goal: Will not experience complications related to bowel motility Outcome: Progressing Goal: Will not experience complications related to urinary retention Outcome: Progressing   Problem: Pain Managment: Goal: General experience of comfort will improve and/or be controlled Outcome: Progressing   Problem: Safety: Goal: Ability to remain free from injury will improve Outcome: Progressing   Problem: Skin Integrity: Goal: Risk for impaired skin integrity will decrease Outcome: Progressing   Problem: Education: Goal: Knowledge of disease or condition will improve Outcome: Progressing Goal: Knowledge of the prescribed therapeutic regimen will improve Outcome: Progressing Goal: Individualized Educational Video(s) Outcome: Progressing   Problem: Activity: Goal: Ability to tolerate increased activity will improve Outcome: Progressing Goal: Will verbalize the importance of balancing activity with adequate rest periods Outcome: Progressing    Problem: Respiratory: Goal: Ability to maintain a clear airway will improve Outcome: Progressing Goal: Levels of oxygenation will improve Outcome: Progressing Goal: Ability to maintain adequate ventilation will improve Outcome: Progressing

## 2023-05-08 DIAGNOSIS — J441 Chronic obstructive pulmonary disease with (acute) exacerbation: Secondary | ICD-10-CM | POA: Diagnosis not present

## 2023-05-08 LAB — BASIC METABOLIC PANEL
Anion gap: 8 (ref 5–15)
BUN: 17 mg/dL (ref 8–23)
CO2: 26 mmol/L (ref 22–32)
Calcium: 8.3 mg/dL — ABNORMAL LOW (ref 8.9–10.3)
Chloride: 100 mmol/L (ref 98–111)
Creatinine, Ser: 0.81 mg/dL (ref 0.61–1.24)
GFR, Estimated: >60 mL/min (ref >60)
Glucose, Bld: 136 mg/dL — ABNORMAL HIGH (ref 70–99)
Potassium: 3.5 mmol/L (ref 3.5–5.1)
Sodium: 134 mmol/L — ABNORMAL LOW (ref 135–145)

## 2023-05-08 LAB — CBC
HCT: 36 % — ABNORMAL LOW (ref 39.0–52.0)
Hemoglobin: 11.3 g/dL — ABNORMAL LOW (ref 13.0–17.0)
MCH: 28.8 pg (ref 26.0–34.0)
MCHC: 31.4 g/dL (ref 30.0–36.0)
MCV: 91.6 fL (ref 80.0–100.0)
Platelets: 418 10*3/uL — ABNORMAL HIGH (ref 150–400)
RBC: 3.93 MIL/uL — ABNORMAL LOW (ref 4.22–5.81)
RDW: 13 % (ref 11.5–15.5)
WBC: 11 10*3/uL — ABNORMAL HIGH (ref 4.0–10.5)
nRBC: 0 % (ref 0.0–0.2)

## 2023-05-08 LAB — PHOSPHORUS: Phosphorus: 3.3 mg/dL (ref 2.5–4.6)

## 2023-05-08 LAB — MAGNESIUM: Magnesium: 2.1 mg/dL (ref 1.7–2.4)

## 2023-05-08 MED ORDER — PREDNISONE 20 MG PO TABS
20.0000 mg | ORAL_TABLET | Freq: Every day | ORAL | Status: DC
  Administered 2023-05-08 – 2023-05-11 (×4): 20 mg via ORAL
  Filled 2023-05-08 (×4): qty 1

## 2023-05-08 NOTE — Plan of Care (Signed)

## 2023-05-08 NOTE — Progress Notes (Signed)
 Mobility Specialist - Progress Note   05/08/23 1013  Oxygen Therapy  SpO2 (!) 85 %  O2 Device Nasal Cannula  O2 Flow Rate (L/min) 6 L/min  Patient Activity (if Appropriate) Ambulating  Mobility  Activity Ambulated independently in hallway  Level of Assistance Independent  Assistive Device None  Distance Ambulated (ft) 80 ft  Activity Response Tolerated well  Mobility Referral Yes  Mobility visit 1 Mobility  Mobility Specialist Start Time (ACUTE ONLY) [id number]  Mobility Specialist Stop Time (ACUTE ONLY) 1012  Mobility Specialist Time Calculation (min) (ACUTE ONLY) 16 min   Pt received in bed and agreeable to mobility. Upon returning to room, pt desat to 85%. Encouraged pursed lip breaths bringing SpO2 to 93%. No complaints during session. Pt to EOB after session with all needs met.    Pre-mobility: 95% SpO2 (6L [place]) During mobility: 92% SpO2 (6L [place]) Post-mobility: 85-93%  SPO2 (6L )  Chief Technology Officer

## 2023-05-08 NOTE — Progress Notes (Signed)
 PROGRESS NOTE  MONTEE TALLMAN  ZDG:[id number] DOB: [birth year] DOA: 05/05/2023 PCP: [facility], [name]   Brief Narrative:  Mario [name] 73 y.o. male with medical history significant of COPD, lung cancer, Chronic shortness of breath at exertion, Who has had recurrent hospitalizations with acute on chronic hypoxic respiratory failure secondary to right lower lobe pneumonia and COPD exacerbation, suicidal ideation, multiple medical problems.   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   Has desat to 85% while ambulating on 6L. Home concentrator will only go up to 5L. Will need to remain in hospital until improved to home concentrator or receive new home oxygen equipment.  - wean O2 as tolerated - breathing treatments - continue steroids, Abx - TOC consulted.    Adjustment disorder with mixed disturbance of emotions and conduct:  Will continue with home regimen and counseling.    Diabetes Mellitus II : Diet controlled.   Resume sliding scale insulin while on steroids.  Continue to monitor   Essential hypertension:  Continue hydralazine and diltiazem.     Chronic pain syndrome:  Resume home regimen of oxycodone.   Non-small cell cancer of the left  lung:  Continue follow-up with oncology at the [facility].  Currently not on treatment.   History of pulmonary embolism:  Resume chronic anticoagulation.   Chronic kidney disease stage IIIa:  Appears to be at baseline.  Continue to monitor   Morbid obesity: Dietary counseling.   Obstructive sleep apnea: Not on CPAP or BiPAP   BPH: Continue Flomax.    DVT prophylaxis: Lovenox Code Status: Full code Family Communication: No family at bed side. Disposition Plan:    Status is: Inpatient Remains inpatient appropriate because:   Readmitted with worsening shortness of breath.  TOC is aware that he needs oxygen with higher concentrator.   Consultants:  None  Procedures: None Antimicrobials: Anti-infectives (From admission, onward)    None       Subjective: Patient reports feeling well at rest. Has not been up to ambulate yet. Concerned about his home oxygen concentrator not going up high enough for his needs. Reports that his HR went up to 160 after he left the hospital last time.   Objective: Vitals:   05/07/23 2034 05/07/23 2139 05/08/23 0510 05/08/23 0731  BP: 127/64  136/60   Pulse: 95  82   Resp: 20  14   Temp: (!) 97.5 F (36.4 C)  97.6 F (36.4 C)   TempSrc: Oral  Oral   SpO2: 94% 94% 94% 91%  Weight:      Height:        Intake/Output Summary (Last 24 hours) at 05/08/2023 0740 Last data filed at 05/08/2023 0514 Gross per 24 hour  Intake 480 ml  Output 625  ml  Net -145 ml   Filed Weights   05/05/23 2000  Weight: 135.3 kg    Examination:  General exam: Appears comfortable, morbidly obese, not in any acute distress. Respiratory system: CTA Bilaterally . Respiratory effort normal.  Cardiovascular system: S1 & S2 heard, RRR. No JVD, murmurs, rubs, gallops or clicks.  Gastrointestinal system: Abdomen is non distended, soft and non tender. Normal bowel sounds heard. Central nervous system: Alert and oriented X 3. No focal neurological deficits. Extremities: No  edema, No cyanosis, No clubbing Skin: No rashes, lesions or ulcers Psychiatry: Judgement and insight appear normal. Mood & affect appropriate.   Data Reviewed: I have personally reviewed following labs and imaging studies  CBC: Recent Labs  Lab 05/03/23 0522 05/04/23 0500 05/05/23 0431 05/06/23 0743 05/08/23 0501  WBC 9.2 7.9 8.3 10.8* 11.0*  NEUTROABS  --  4.8 5.0  --   --   HGB 12.5* 11.9* 11.7* 12.5* 11.3*  HCT 39.2 38.0* 37.5* 40.6 36.0*  MCV 92.2 94.3 93.8 93.5 91.6  PLT 410* 356 385 458* 418*   Basic Metabolic Panel: Recent Labs  Lab 05/03/23 0522 05/04/23 0500 05/05/23 0431 05/06/23 0743 05/08/23 0501  NA 133* 137 136 133* 134*  K 3.6 3.8 3.8 4.0 3.5  CL 99 101 101 99 100  CO2 25 24 26 25 26   GLUCOSE 128* 122* 115* 107* 136*  BUN 13 14 17 15 17   CREATININE 0.79 0.75 0.81 0.75 0.81  CALCIUM 8.6* 8.7* 8.2* 8.5* 8.3*  MG 2.1 1.9 2.0  --  2.1  PHOS 3.6  --  3.9  --  3.3   GFR: Estimated Creatinine Clearance: 122.3 mL/min (by C-G formula based on SCr of 0.81 mg/dL). Liver Function Tests: Recent Labs  Lab 05/03/23 0522 05/04/23 0500 05/05/23 0431 05/06/23 0743  AST 15 15 13* 10*  ALT 13 13 13 14   ALKPHOS 83 72 73 79  BILITOT 0.3 0.5 0.3 0.6  PROT 7.0 6.1* 6.2* 7.2  ALBUMIN 3.5 3.0* 2.9* 3.6   Radiology Studies: No results found.  Scheduled Meds:  arformoterol  15 mcg Nebulization BID   And   umeclidinium bromide  1 puff Inhalation Daily   buPROPion  300 mg Oral q morning   busPIRone  15 mg Oral Daily   cholecalciferol  1,000 Units Oral Daily   cycloSPORINE  1 drop Both Eyes Q12H   diltiazem  180 mg Oral Daily   docusate sodium  200 mg Oral BID   DULoxetine  60 mg Oral BID   enoxaparin (LOVENOX) injection  60 mg Subcutaneous Q24H   gabapentin  100 mg Oral TID   hydrALAZINE  25 mg Oral BID   melatonin  6 mg Oral QHS   multivitamin with minerals  1 tablet Oral Daily   Oxcarbazepine  600 mg Oral TID   pantoprazole  40 mg Oral Daily    predniSONE  50 mg Oral Q breakfast   psyllium  1 packet Oral Daily   tamsulosin  0.8 mg Oral Daily   traZODone  75 mg Oral QHS   Continuous Infusions:   LOS: 3 days    Time spent: 35 mins    Leeroy Bock, MD [facility]   If 7PM-7AM, please contact night-coverage

## 2023-05-09 DIAGNOSIS — J441 Chronic obstructive pulmonary disease with (acute) exacerbation: Secondary | ICD-10-CM | POA: Diagnosis not present

## 2023-05-09 MED ORDER — LORAZEPAM 2 MG/ML IJ SOLN
0.5000 mg | Freq: Once | INTRAMUSCULAR | Status: AC | PRN
  Administered 2023-05-09: 0.5 mg via INTRAVENOUS
  Filled 2023-05-09: qty 1

## 2023-05-09 MED ORDER — LORAZEPAM 2 MG/ML IJ SOLN
0.5000 mg | Freq: Once | INTRAMUSCULAR | Status: AC
  Administered 2023-05-09: 0.5 mg via INTRAVENOUS
  Filled 2023-05-09: qty 1

## 2023-05-09 MED ORDER — HYDRALAZINE HCL 25 MG PO TABS
25.0000 mg | ORAL_TABLET | Freq: Three times a day (TID) | ORAL | Status: DC
  Administered 2023-05-09 – 2023-05-16 (×21): 25 mg via ORAL
  Filled 2023-05-09 (×22): qty 1

## 2023-05-09 NOTE — Progress Notes (Signed)
 Physical Therapy Treatment Patient Details Name: Mario [name] MRN: [id number] DOB: January 26, 1951 Today's Date: 05/09/2023   History of Present Illness Mario [name] is a 73 y.o. male returns from parking lot of hospital with shortness of breath, admitted 05/05/23. Dx of acute on chronic respiratory failure, PNA. Of note, recent hospitalization 2/28-3/7 with acute on chronic hypoxic respiratory failure secondary to right lower lobe pneumonia and COPD exacerbation, suicidal ideation, multiple medical problems.    PMH: shortness of breath as a result of COPD, lung cancer, hx of PE    PT Comments  Pt ambulated 100' without an assistive device, no loss of balance, SpO2 87% on 6L O2 while walking, HR 125 max while walking, 3/4 dyspnea. SpO2 93% on 6L at rest. Pt demonstrates good understanding of pursed lip breathing technique.  Activity tolerance limited by dyspnea. Encouraged pt to independently perform seated marching and knee extension AROM for strengthening exercises later after he's recovered from walking.   If plan is discharge home, recommend the following: Help with stairs or ramp for entrance;Assistance with cooking/housework;Assist for transportation   Can travel by private vehicle        Equipment Recommendations  None recommended by PT    Recommendations for Other Services       Precautions / Restrictions Precautions Precautions: Fall Recall of Precautions/Restrictions: Intact Precaution/Restrictions Comments: Monitor O2, 5L O2 baseline Restrictions Weight Bearing Restrictions Per Provider Order: No     Mobility  Bed Mobility Overal bed mobility: Modified Independent             General bed mobility comments: HOB up, used rail    Transfers Overall transfer level: Needs assistance Equipment used: None Transfers: Sit to/from Stand Sit to Stand: Supervision           General transfer comment: supervision for safety, pt reports he lost his balance but did not  fall in bathroom this morning    Ambulation/Gait Ambulation/Gait assistance: Supervision Gait Distance (Feet): 100 Feet Assistive device: None Gait Pattern/deviations: Step-through pattern, Decreased stride length Gait velocity: functional     General Gait Details: steady, no loss of balance, good pursed lip breathing technique, SpO2 87% on 6L O2 towards end of walk, HR 125 max with walking, 3/4 dyspnea with walking, pt required ~2 minutes seated rest for SpO2 to recover to 92% on 6L O2. SpO2 93% at rest on 6L O2 prior to activity.   Stairs             Wheelchair Mobility     Tilt Bed    Modified Rankin (Stroke Patients Only)       Balance Overall balance assessment: No apparent balance deficits (not formally assessed)   Sitting balance-Leahy Scale: Good       Standing balance-Leahy Scale: Good                              Communication Communication Communication: No apparent difficulties  Cognition Arousal: Alert Behavior During Therapy: WFL for tasks assessed/performed   PT - Cognitive impairments: No apparent impairments                                Cueing    Exercises      General Comments        Pertinent Vitals/Pain Pain Assessment Faces Pain Scale: No hurt    Home Living  Prior Function            PT Goals (current goals can now be found in the care plan section) Acute Rehab PT Goals Patient Stated Goal: "not be short of breath" PT Goal Formulation: With patient Time For Goal Achievement: 05/20/23 Potential to Achieve Goals: Good Progress towards PT goals: Progressing toward goals    Frequency    Min 3X/week      PT Plan      Co-evaluation              AM-PAC PT "6 Clicks" Mobility   Outcome Measure  Help needed turning from your back to your side while in a flat bed without using bedrails?: None Help needed moving from lying on your back to sitting  on the side of a flat bed without using bedrails?: None Help needed moving to and from a bed to a chair (including a wheelchair)?: None Help needed standing up from a chair using your arms (e.g., wheelchair or bedside chair)?: None Help needed to walk in hospital room?: None Help needed climbing 3-5 steps with a railing? : A Little 6 Click Score: 23    End of Session Equipment Utilized During Treatment: Oxygen Activity Tolerance: Treatment limited secondary to medical complications (Comment) (3/4 dyspnea with activity) Patient left: in bed;with call bell/phone within reach Nurse Communication: Mobility status PT Visit Diagnosis: Other abnormalities of gait and mobility (R26.89)     Time: 1914-7829 PT Time Calculation (min) (ACUTE ONLY): 10 min  Charges:    $Gait Training: 8-22 mins PT General Charges $$ ACUTE PT VISIT: 1 Visit                     Tamala Ser PT 05/09/2023  Acute Rehabilitation Services  Office [phone]

## 2023-05-09 NOTE — TOC Progression Note (Signed)
 Transition of Care [facility]) - Progression Note    Patient Details  Name: Mario [name] MRN: [id number] Date of Birth: [birth year]  [facility]) CM/SW Contact  Beckie Busing, RN Phone Number:[phone]  05/09/2023, 10:08 AM  Clinical Narrative:    TOC received call from [facility] with [facility]. Thayer Ohm states that patient has reached out to [facility] for assistance with a new living community because patient told him that he has difficulty getting to the second floor of his apartment. CM has informed Thayer Ohm that this will not be a barrier for discharge. TOC will continue to follow.    Expected Discharge Plan: Home w Home Health Services Barriers to Discharge: No Barriers Identified  Expected Discharge Plan and Services In-house Referral: NA Discharge Planning Services: CM Consult Post Acute Care Choice: Home Health Living arrangements for the past 2 months: Single Family Home                 DME Arranged: N/A DME Agency: NA       HH Arranged: PT HH Agency:  [facility]) Date HH Agency Contacted: 05/06/23 Time HH Agency Contacted: 1529 Representative spoke with at [facility] Agency: Clifton Custard   Social Determinants of Health (SDOH) Interventions SDOH Screenings   Food Insecurity: No Food Insecurity (05/05/2023)  Housing: Low Risk  (05/05/2023)  Transportation Needs: No Transportation Needs (05/05/2023)  Utilities: Not At Risk (05/05/2023)  Financial Resource Strain: Low Risk  (09/23/2020)  Social Connections: Socially Isolated (05/05/2023)  Stress: No Stress Concern Present (09/23/2020)  Tobacco Use: Medium Risk (05/05/2023)    Readmission Risk Interventions    05/06/2023    3:25 PM 04/24/2023   10:09 AM 03/25/2023    2:23 PM  Readmission Risk Prevention Plan  Transportation Screening Complete Complete Complete  Medication Review Oceanographer) Complete Complete Complete  PCP or Specialist appointment within 3-5 days of discharge Complete    HRI or Home  Care Consult Complete Complete Complete  SW Recovery Care/Counseling Consult Complete Complete Complete  Palliative Care Screening Not Applicable Not Applicable Not Applicable  Skilled Nursing Facility Not Applicable Not Applicable Not Applicable

## 2023-05-09 NOTE — Plan of Care (Signed)

## 2023-05-09 NOTE — Progress Notes (Signed)
 PROGRESS NOTE  Mario [name]  ZOX:[id number] DOB: 06-Feb-1951 DOA: 05/05/2023 PCP: Clinic, [name]   Brief Narrative:  Mario [name] 73 y.o. male with medical history significant of COPD, lung cancer, Chronic shortness of breath at exertion, Who has had recurrent hospitalizations with acute on chronic hypoxic respiratory failure secondary to right lower lobe pneumonia and COPD exacerbation, suicidal ideation, multiple medical problems.   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   Has desat to 85% while ambulating on 6L. Home concentrator will only go up to 5L. Will need to remain in hospital until can receive a new concentrator that will be able to go up higher.  - wean O2 as tolerated - breathing treatments - continue steroids, Abx - TOC consulted.    Adjustment disorder with mixed disturbance of emotions and conduct:  Will continue with home regimen and counseling.    Diabetes Mellitus II : Diet controlled.   Resume sliding scale insulin while on steroids.  Continue to monitor   Essential hypertension:  Continue hydralazine and diltiazem.     Chronic pain syndrome:  Resume home regimen of oxycodone.   Non-small cell cancer of the left lung:   Continue follow-up with oncology at the [facility].  Currently not on treatment.   History of pulmonary embolism:  Resume chronic anticoagulation.   Chronic kidney disease stage IIIa:  Appears to be at baseline.  Continue to monitor   Morbid obesity: Dietary counseling.   Obstructive sleep apnea: Not on CPAP or BiPAP   BPH: Continue Flomax.    DVT prophylaxis: Lovenox Code Status: Full code Family Communication: No family at bed side. Disposition Plan:    Status is: Inpatient Remains inpatient appropriate because:   Readmitted with worsening shortness of breath.  TOC is aware that he needs oxygen with higher concentrator.   Consultants:  None  Procedures: None Antimicrobials: Anti-infectives (From admission, onward)    None       Subjective: Patient reports feeling well at rest. Still having shortness of breath with exertion.  Objective: Vitals:   05/08/23 1338 05/08/23 2145 05/08/23 2204 05/09/23 0512  BP: (!) 155/80 (!) 165/87  (!) 157/84  Pulse: (!) 109 95 (!) 101 89  Resp: 20 16 (!) 21 16  Temp: 98.4 F (36.9 C) 97.8 F (36.6 C)  97.6 F (36.4 C)  TempSrc: Oral Oral  Oral  SpO2: 95% 96% 94% 91%  Weight:      Height:        Intake/Output Summary (Last 24 hours) at 05/09/2023 0643 Last data filed at 05/09/2023 0513 Gross per 24 hour  Intake 480 ml  Output 1500 ml  Net -1020 ml   Filed Weights   05/05/23 2000  Weight: 135.3 kg  Examination:  General exam: Appears comfortable, morbidly obese, not in any acute distress. Respiratory system: CTA Bilaterally . Respiratory effort normal.  Cardiovascular system: S1 & S2 heard, RRR. No JVD, murmurs, rubs, gallops or clicks.  Gastrointestinal system: Abdomen is non distended, soft and non tender. Normal bowel sounds heard. Central nervous system: Alert and oriented X 3. No focal neurological deficits. Extremities: No edema, No cyanosis, No clubbing Skin: No rashes, lesions or ulcers Psychiatry: Judgement  and insight appear normal. Mood & affect appropriate.   Data Reviewed: I have personally reviewed following labs and imaging studies  CBC: Recent Labs  Lab 05/03/23 0522 05/04/23 0500 05/05/23 0431 05/06/23 0743 05/08/23 0501  WBC 9.2 7.9 8.3 10.8* 11.0*  NEUTROABS  --  4.8 5.0  --   --   HGB 12.5* 11.9* 11.7* 12.5* 11.3*  HCT 39.2 38.0* 37.5* 40.6 36.0*  MCV 92.2 94.3 93.8 93.5 91.6  PLT 410* 356 385 458* 418*   Basic Metabolic Panel: Recent Labs  Lab 05/03/23 0522 05/04/23 0500 05/05/23 0431 05/06/23 0743 05/08/23 0501  NA 133* 137 136 133* 134*  K 3.6 3.8 3.8 4.0 3.5  CL 99 101 101 99 100  CO2 25 24 26 25 26   GLUCOSE 128* 122* 115* 107* 136*  BUN 13 14 17 15 17   CREATININE 0.79 0.75 0.81 0.75 0.81  CALCIUM 8.6* 8.7* 8.2* 8.5* 8.3*  MG 2.1 1.9 2.0  --  2.1  PHOS 3.6  --  3.9  --  3.3   GFR: Estimated Creatinine Clearance: 122.3 mL/min (by C-G formula based on SCr of 0.81 mg/dL). Liver Function Tests: Recent Labs  Lab 05/03/23 0522 05/04/23 0500 05/05/23 0431 05/06/23 0743  AST 15 15 13* 10*  ALT 13 13 13 14   ALKPHOS 83 72 73 79  BILITOT 0.3 0.5 0.3 0.6  PROT 7.0 6.1* 6.2* 7.2  ALBUMIN 3.5 3.0* 2.9* 3.6   Radiology Studies: No results found.  Scheduled Meds:  arformoterol  15 mcg Nebulization BID   And   umeclidinium bromide  1 puff Inhalation Daily   buPROPion  300 mg Oral q morning   busPIRone  15 mg Oral Daily   cholecalciferol  1,000 Units Oral Daily   cycloSPORINE  1 drop Both Eyes Q12H   diltiazem  180 mg Oral Daily   DULoxetine  60 mg Oral BID   enoxaparin (LOVENOX) injection  60 mg Subcutaneous Q24H   gabapentin  100 mg Oral TID   hydrALAZINE  25 mg Oral BID   melatonin  6 mg Oral QHS   multivitamin with minerals  1 tablet Oral Daily   Oxcarbazepine  600 mg Oral TID   pantoprazole  40 mg Oral Daily   predniSONE  20 mg Oral Q breakfast   psyllium  1 packet Oral Daily   tamsulosin  0.8 mg Oral Daily   traZODone  75 mg Oral QHS    Continuous Infusions:   LOS: 4 days    Time spent: 35 mins    Leeroy Bock, MD [facility]   If 7PM-7AM, please contact night-coverage

## 2023-05-10 DIAGNOSIS — J441 Chronic obstructive pulmonary disease with (acute) exacerbation: Secondary | ICD-10-CM | POA: Diagnosis not present

## 2023-05-10 MED ORDER — HYDROXYZINE HCL 10 MG PO TABS
10.0000 mg | ORAL_TABLET | Freq: Three times a day (TID) | ORAL | Status: DC | PRN
  Administered 2023-05-10 – 2023-05-15 (×6): 10 mg via ORAL
  Filled 2023-05-10 (×9): qty 1

## 2023-05-10 MED ORDER — FLUTICASONE PROPIONATE 50 MCG/ACT NA SUSP
1.0000 | Freq: Every day | NASAL | Status: DC
  Administered 2023-05-10 – 2023-05-16 (×7): 1 via NASAL
  Filled 2023-05-10: qty 16

## 2023-05-10 MED ORDER — PSEUDOEPHEDRINE HCL ER 120 MG PO TB12
120.0000 mg | ORAL_TABLET | Freq: Two times a day (BID) | ORAL | Status: AC
  Administered 2023-05-10 – 2023-05-11 (×4): 120 mg via ORAL
  Filled 2023-05-10 (×4): qty 1

## 2023-05-10 MED ORDER — CARVEDILOL PHOSPHATE ER 20 MG PO CP24
20.0000 mg | ORAL_CAPSULE | Freq: Every day | ORAL | Status: DC
  Administered 2023-05-10 – 2023-05-16 (×7): 20 mg via ORAL
  Filled 2023-05-10 (×7): qty 1

## 2023-05-10 NOTE — Progress Notes (Signed)
 SATURATION QUALIFICATIONS: (This note is used to comply with regulatory documentation for home oxygen)  Patient Saturations on Room Air at Rest = 78%  Patient Saturations on Room Air while Ambulating = 72%  Patient Saturations on 5 Liters of oxygen while Ambulating = 95%  Please briefly explain why patient needs home oxygen: cannot maintain acceptable O2 saturation while ambulating.

## 2023-05-10 NOTE — TOC Progression Note (Addendum)
 Transition of Care [facility]) - Progression Note    Patient Details  Name: Mario [name] MRN: [id number] Date of Birth: Nov 19, 1950  [facility]) CM/SW Contact  Beckie Busing, RN Phone Number:[phone]  05/10/2023, 10:56 AM  Clinical Narrative:    TOC consulted for increase in O2 demands.CM at bedside to discuss with patient. Patient verbalizes understanding and states that he was told by [facility] that increase in O2 must go through [place]. CM has called [facility] for info in order to submit new O2 information.  1130 CM received return call from [place] coordinator providing CM with patients contacts at [place]. CM will need to submit new O2 requirements with supporting  supporting documentation. CM has requested a new ambulatory O2 saturation screen.  1545 Cm continues to wait for ambulatory saturation screen.CM can not request increased O2 without this information. CM sending another request at this time.   1900 CM reviewed chart for O2 saturation screen. No documentation noted. Will follow up tomorrow.    Expected Discharge Plan: Home w Home Health Services Barriers to Discharge: No Barriers Identified  Expected Discharge Plan and Services In-house Referral: NA Discharge Planning Services: CM Consult Post Acute Care Choice: Home Health Living arrangements for the past 2 months: Single Family Home                 DME Arranged: N/A DME Agency: NA       HH Arranged: PT HH Agency:  [facility]) Date HH Agency Contacted: 05/06/23 Time HH Agency Contacted: 1529 Representative spoke with at [facility] Agency: Clifton Custard   Social Determinants of Health (SDOH) Interventions SDOH Screenings   Food Insecurity: No Food Insecurity (05/05/2023)  Housing: Low Risk  (05/05/2023)  Transportation Needs: No Transportation Needs (05/05/2023)  Utilities: Not At Risk (05/05/2023)  Financial Resource Strain: Low Risk  (09/23/2020)  Social Connections: Socially Isolated (05/05/2023)  Stress:  No Stress Concern Present (09/23/2020)  Tobacco Use: Medium Risk (05/05/2023)    Readmission Risk Interventions    05/06/2023    3:25 PM 04/24/2023   10:09 AM 03/25/2023    2:23 PM  Readmission Risk Prevention Plan  Transportation Screening Complete Complete Complete  Medication Review Oceanographer) Complete Complete Complete  PCP or Specialist appointment within 3-5 days of discharge Complete    HRI or Home Care Consult Complete Complete Complete  SW Recovery Care/Counseling Consult Complete Complete Complete  Palliative Care Screening Not Applicable Not Applicable Not Applicable  Skilled Nursing Facility Not Applicable Not Applicable Not Applicable

## 2023-05-10 NOTE — Progress Notes (Signed)
 PROGRESS NOTE  Mario [name]  UJW:[id number] DOB: 1950-12-20 DOA: 05/05/2023 PCP: [name]   Brief Narrative:  Mario [name] 73 y.o. male with medical history significant of COPD, lung cancer, Chronic shortness of breath at exertion, Who has had recurrent hospitalizations with acute on chronic hypoxic respiratory failure secondary to right lower lobe pneumonia and COPD exacerbation, suicidal ideation, multiple medical problems.   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 Home concentrator will only go up to 5L. Will need to remain in hospital until can receive a new concentrator that will be able to go up higher.  - wean O2 as tolerated - breathing treatments - continue steroids, Abx - TOC consulted.    Adjustment disorder with mixed disturbance of emotions and conduct:  Will continue with home regimen and counseling.    Diabetes Mellitus II : Diet controlled.   Resume sliding scale insulin while on steroids.  Continue to monitor   Essential hypertension:  Continue hydralazine and diltiazem.   - adding carvedilol for tachycardia with exertion   Chronic pain syndrome:  Resume home regimen of oxycodone.   Non-small cell cancer of the left lung:  Continue  follow-up with oncology at the [facility].  Currently not on treatment.   History of pulmonary embolism: neg PE study on original admission   Chronic kidney disease stage IIIa:  Appears to be at baseline.  Continue to monitor   Morbid obesity: Dietary counseling.   Obstructive sleep apnea: Not on CPAP or BiPAP   BPH: Continue Flomax.    DVT prophylaxis: Lovenox Code Status: Full code Family Communication: No family at bed side. Disposition Plan:    Status is: Inpatient Remains inpatient appropriate because:   Readmitted with worsening shortness of breath.  TOC is aware that he needs oxygen with higher concentrator.   Consultants:  None  Procedures: None Antimicrobials: Anti-infectives (From admission, onward)    None       Subjective: Patient reports feeling well at rest. Still having shortness of breath with exertion. Reports needing help with getting new housing as he has steps in his current house to get to bedroom and bathroom  Objective: Vitals:   05/09/23 1535 05/09/23 1928 05/09/23 2100 05/10/23 0421  BP: (!) 150/92  (!) 157/93 137/82  Pulse:   98 83  Resp:   16 14  Temp:   (!) 97.3 F (36.3 C) 97.7 F (36.5 C)  TempSrc:   Oral Oral  SpO2:  93% 96% 95%  Weight:      Height:        Intake/Output Summary (Last 24 hours) at 05/10/2023 0732 Last data filed at 05/10/2023 0423 Gross per 24 hour  Intake 480 ml  Output 1275 ml  Net -795 ml  Filed Weights   05/05/23 2000  Weight: 135.3 kg    Examination:  General exam: Appears comfortable, morbidly obese, not in any acute distress. Respiratory system: CTA Bilaterally . Respiratory effort normal.  Cardiovascular system: S1 & S2 heard, RRR. No JVD, murmurs, rubs, gallops or clicks.  Gastrointestinal system: Abdomen is non distended, soft and non tender. Normal bowel sounds heard. Central nervous system: Alert and oriented X 3. No focal neurological deficits. Extremities: No edema, No cyanosis, No  clubbing Skin: No rashes, lesions or ulcers Psychiatry: Judgement and insight appear normal. Mood & affect appropriate.   Data Reviewed: I have personally reviewed following labs and imaging studies  CBC: Recent Labs  Lab 05/04/23 0500 05/05/23 0431 05/06/23 0743 05/08/23 0501  WBC 7.9 8.3 10.8* 11.0*  NEUTROABS 4.8 5.0  --   --   HGB 11.9* 11.7* 12.5* 11.3*  HCT 38.0* 37.5* 40.6 36.0*  MCV 94.3 93.8 93.5 91.6  PLT 356 385 458* 418*   Basic Metabolic Panel: Recent Labs  Lab 05/04/23 0500 05/05/23 0431 05/06/23 0743 05/08/23 0501  NA 137 136 133* 134*  K 3.8 3.8 4.0 3.5  CL 101 101 99 100  CO2 24 26 25 26   GLUCOSE 122* 115* 107* 136*  BUN 14 17 15 17   CREATININE 0.75 0.81 0.75 0.81  CALCIUM 8.7* 8.2* 8.5* 8.3*  MG 1.9 2.0  --  2.1  PHOS  --  3.9  --  3.3   GFR: Estimated Creatinine Clearance: 122.3 mL/min (by C-G formula based on SCr of 0.81 mg/dL). Liver Function Tests: Recent Labs  Lab 05/04/23 0500 05/05/23 0431 05/06/23 0743  AST 15 13* 10*  ALT 13 13 14   ALKPHOS 72 73 79  BILITOT 0.5 0.3 0.6  PROT 6.1* 6.2* 7.2  ALBUMIN 3.0* 2.9* 3.6   Radiology Studies: No results found.  Scheduled Meds:  arformoterol  15 mcg Nebulization BID   And   umeclidinium bromide  1 puff Inhalation Daily   buPROPion  300 mg Oral q morning   busPIRone  15 mg Oral Daily   cholecalciferol  1,000 Units Oral Daily   cycloSPORINE  1 drop Both Eyes Q12H   diltiazem  180 mg Oral Daily   DULoxetine  60 mg Oral BID   enoxaparin (LOVENOX) injection  60 mg Subcutaneous Q24H   gabapentin  100 mg Oral TID   hydrALAZINE  25 mg Oral TID   melatonin  6 mg Oral QHS   multivitamin with minerals  1 tablet Oral Daily   Oxcarbazepine  600 mg Oral TID   pantoprazole  40 mg Oral Daily   predniSONE  20 mg Oral Q breakfast   psyllium  1 packet Oral Daily   tamsulosin  0.8 mg Oral Daily   traZODone  75 mg Oral QHS   Continuous Infusions:   LOS: 5 days    Time spent: 35  mins    Mario Bock, MD [facility]   If 7PM-7AM, please contact night-coverage

## 2023-05-10 NOTE — Plan of Care (Signed)
  Problem: Education: Goal: Knowledge of General Education information will improve Description: Including pain rating scale, medication(s)/side effects and non-pharmacologic comfort measures Outcome: Progressing   Problem: Health Behavior/Discharge Planning: Goal: Ability to manage health-related needs will improve Outcome: Progressing   Problem: Clinical Measurements: Goal: Ability to maintain clinical measurements within normal limits will improve Outcome: Progressing Goal: Will remain free from infection Outcome: Progressing Goal: Diagnostic test results will improve Outcome: Progressing Goal: Respiratory complications will improve Outcome: Progressing Goal: Cardiovascular complication will be avoided Outcome: Progressing   Problem: Activity: Goal: Risk for activity intolerance will decrease Outcome: Progressing   Problem: Nutrition: Goal: Adequate nutrition will be maintained Outcome: Progressing   Problem: Coping: Goal: Level of anxiety will decrease Outcome: Progressing   Problem: Elimination: Goal: Will not experience complications related to bowel motility Outcome: Progressing Goal: Will not experience complications related to urinary retention Outcome: Progressing   Problem: Pain Managment: Goal: General experience of comfort will improve and/or be controlled Outcome: Progressing   Problem: Safety: Goal: Ability to remain free from injury will improve Outcome: Progressing   Problem: Skin Integrity: Goal: Risk for impaired skin integrity will decrease Outcome: Progressing   Problem: Activity: Goal: Will verbalize the importance of balancing activity with adequate rest periods Outcome: Progressing   Problem: Respiratory: Goal: Ability to maintain a clear airway will improve Outcome: Progressing

## 2023-05-11 DIAGNOSIS — J441 Chronic obstructive pulmonary disease with (acute) exacerbation: Secondary | ICD-10-CM | POA: Diagnosis not present

## 2023-05-11 MED ORDER — ONDANSETRON 4 MG PO TBDP
4.0000 mg | ORAL_TABLET | Freq: Three times a day (TID) | ORAL | Status: DC | PRN
  Administered 2023-05-11: 4 mg via ORAL
  Filled 2023-05-11: qty 1

## 2023-05-11 MED ORDER — POLYETHYLENE GLYCOL 3350 17 G PO PACK
17.0000 g | PACK | Freq: Two times a day (BID) | ORAL | Status: DC
  Administered 2023-05-11 – 2023-05-14 (×6): 17 g via ORAL
  Filled 2023-05-11 (×12): qty 1

## 2023-05-11 MED ORDER — NAPHAZOLINE-PHENIRAMINE 0.025-0.3 % OP SOLN
1.0000 [drp] | Freq: Three times a day (TID) | OPHTHALMIC | Status: DC
  Administered 2023-05-11 – 2023-05-16 (×15): 1 [drp] via OPHTHALMIC
  Filled 2023-05-11 (×2): qty 15

## 2023-05-11 MED ORDER — ONDANSETRON HCL 4 MG/2ML IJ SOLN
4.0000 mg | Freq: Three times a day (TID) | INTRAMUSCULAR | Status: DC | PRN
  Filled 2023-05-11: qty 2

## 2023-05-11 NOTE — Progress Notes (Signed)
 PROGRESS NOTE  Mario [name]  WUJ:[id number] DOB: 04/09/1950 DOA: 05/05/2023 PCP: [name]   Brief Narrative:  Mario [name] 73 y.o. male with medical history significant of COPD, lung cancer, Chronic shortness of breath at exertion, Who has had recurrent hospitalizations with acute on chronic hypoxic respiratory failure secondary to right lower lobe pneumonia and COPD exacerbation, suicidal ideation, multiple medical problems.   Currently awaiting safe dispo plan including higher capacity oxygen concentrator as well as working on stairs with PT.   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 Home concentrator will only go up to 5L. Will need to remain in hospital until can receive a new concentrator that will be able to go up higher.  - wean O2 as tolerated - breathing treatments - completed steroids, Abx - TOC consulted.  - PT/OT   Adjustment disorder with mixed disturbance of emotions and conduct:  Will continue with home regimen and counseling.    Diabetes Mellitus II : Diet controlled.   Resume sliding scale insulin while on steroids.  Continue to monitor   Essential hypertension:  Continue hydralazine and diltiazem.   - adding carvedilol for tachycardia with  exertion seems to have tolerated well   Chronic pain syndrome:  Resume home regimen of oxycodone.   Non-small cell cancer of the left lung:  Continue follow-up with oncology at the [facility].  Currently not on treatment.   History of pulmonary embolism: neg PE study on original admission   Chronic kidney disease stage IIIa:  Appears to be at baseline.  Continue to monitor   Morbid obesity: Dietary counseling.   Obstructive sleep apnea: Not on CPAP or BiPAP   BPH: Continue Flomax.    DVT prophylaxis: Lovenox Code Status: Full code Family Communication: No family at bed side. Disposition Plan:    Status is: Inpatient Remains inpatient appropriate because:   Readmitted with worsening shortness of breath.  TOC is aware that he needs oxygen with higher concentrator.   Consultants:  None  Procedures: None Antimicrobials: Anti-infectives (From admission, onward)    None       Subjective: Patient reports feeling well at rest.  Worried about going home due to the stairs. Wants help with getting into an assisted living facility   Objective: Vitals:   05/10/23 0840 05/10/23 1413 05/10/23 1944 05/10/23 2023  BP:  (!) 159/84  (!) 156/90  Pulse:  (!) 106  90  Resp:  20  17  Temp:  97.9 F (36.6 C)  (!) 97.4 F (36.3 C)  TempSrc:  Oral  Oral  SpO2: 95% 95% 96% 91%  Weight:      Height:        Intake/Output Summary (Last 24 hours) at 05/11/2023 9562 Last data filed at  05/11/2023 0600 Gross per 24 hour  Intake 1070 ml  Output 1450 ml  Net -380 ml   Filed Weights   05/05/23 2000  Weight: 135.3 kg    Examination:  General exam: Appears comfortable, morbidly obese, not in any acute distress. Respiratory system: CTA Bilaterally . Respiratory effort normal.  Cardiovascular system: S1 & S2 heard, RRR. No JVD, murmurs, rubs, gallops or clicks.  Central nervous system: Alert and oriented X 3. No focal neurological deficits. Extremities: No edema, No cyanosis, No  clubbing Skin: No rashes, lesions or ulcers Psychiatry: Judgement and insight appear normal. Mood & affect appropriate.   Data Reviewed: I have personally reviewed following labs and imaging studies  CBC: Recent Labs  Lab 05/05/23 0431 05/06/23 0743 05/08/23 0501  WBC 8.3 10.8* 11.0*  NEUTROABS 5.0  --   --   HGB 11.7* 12.5* 11.3*  HCT 37.5* 40.6 36.0*  MCV 93.8 93.5 91.6  PLT 385 458* 418*   Basic Metabolic Panel: Recent Labs  Lab 05/05/23 0431 05/06/23 0743 05/08/23 0501  NA 136 133* 134*  K 3.8 4.0 3.5  CL 101 99 100  CO2 26 25 26   GLUCOSE 115* 107* 136*  BUN 17 15 17   CREATININE 0.81 0.75 0.81  CALCIUM 8.2* 8.5* 8.3*  MG 2.0  --  2.1  PHOS 3.9  --  3.3   GFR: Estimated Creatinine Clearance: 122.3 mL/min (by C-G formula based on SCr of 0.81 mg/dL). Liver Function Tests: Recent Labs  Lab 05/05/23 0431 05/06/23 0743  AST 13* 10*  ALT 13 14  ALKPHOS 73 79  BILITOT 0.3 0.6  PROT 6.2* 7.2  ALBUMIN 2.9* 3.6   Radiology Studies: No results found.  Scheduled Meds:  arformoterol  15 mcg Nebulization BID   And   umeclidinium bromide  1 puff Inhalation Daily   buPROPion  300 mg Oral q morning   busPIRone  15 mg Oral Daily   carvedilol  20 mg Oral Daily   cholecalciferol  1,000 Units Oral Daily   cycloSPORINE  1 drop Both Eyes Q12H   diltiazem  180 mg Oral Daily   DULoxetine  60 mg Oral BID   enoxaparin (LOVENOX) injection  60 mg Subcutaneous Q24H   fluticasone  1 spray Each Nare Daily   gabapentin  100 mg Oral TID   hydrALAZINE  25 mg Oral TID   melatonin  6 mg Oral QHS   multivitamin with minerals  1 tablet Oral Daily   Oxcarbazepine  600 mg Oral TID   pantoprazole  40 mg Oral Daily   predniSONE  20 mg Oral Q breakfast   pseudoephedrine  120 mg Oral BID   psyllium  1 packet Oral Daily   tamsulosin  0.8 mg Oral Daily   traZODone  75 mg Oral QHS   Continuous Infusions:   LOS: 6 days    Time spent: 35 mins    Leeroy Bock, MD [facility]   If 7PM-7AM, please contact night-coverage

## 2023-05-11 NOTE — Plan of Care (Signed)
  Problem: Education: Goal: Knowledge of General Education information will improve Description: Including pain rating scale, medication(s)/side effects and non-pharmacologic comfort measures Outcome: Progressing   Problem: Clinical Measurements: Goal: Ability to maintain clinical measurements within normal limits will improve Outcome: Progressing Goal: Will remain free from infection Outcome: Progressing Goal: Diagnostic test results will improve Outcome: Progressing Goal: Cardiovascular complication will be avoided Outcome: Progressing   Problem: Nutrition: Goal: Adequate nutrition will be maintained Outcome: Progressing   Problem: Elimination: Goal: Will not experience complications related to bowel motility Outcome: Progressing Goal: Will not experience complications related to urinary retention Outcome: Progressing   Problem: Safety: Goal: Ability to remain free from injury will improve Outcome: Progressing   Problem: Skin Integrity: Goal: Risk for impaired skin integrity will decrease Outcome: Progressing   Problem: Activity: Goal: Will verbalize the importance of balancing activity with adequate rest periods Outcome: Progressing   Problem: Respiratory: Goal: Ability to maintain a clear airway will improve Outcome: Progressing

## 2023-05-11 NOTE — Progress Notes (Signed)
 Physical Therapy Treatment Patient Details Name: Mario [name] MRN: [id number] DOB: 06/26/1950 Today's Date: 05/11/2023   History of Present Illness Mario [name] is a 73 y.o. male returns from parking lot of hospital with shortness of breath, admitted 05/05/23. Dx of acute on chronic respiratory failure, PNA. Of note, recent hospitalization 2/28-3/7 with acute on chronic hypoxic respiratory failure secondary to right lower lobe pneumonia and COPD exacerbation, suicidal ideation, multiple medical problems.    PMH: shortness of breath as a result of COPD, lung cancer, hx of PE    PT Comments  Pt ambulated 65' without an assistive device, distance limited by 4/4 dyspnea. SpO2 87% on 5L after 2 minutes of standing activity, 88-90% on 6L O2 walking. Pt declined stair training as he stated his "legs feel like rubber" with walking and he had 4/4 dyspnea. Pt required ~4 minutes seated rest after activity for SpO2 to come up to 94% on 5L.     If plan is discharge home, recommend the following: Help with stairs or ramp for entrance;Assistance with cooking/housework;Assist for transportation   Can travel by private vehicle        Equipment Recommendations  None recommended by PT    Recommendations for Other Services       Precautions / Restrictions Precautions Precautions: Fall Recall of Precautions/Restrictions: Intact Precaution/Restrictions Comments: Monitor O2, 5L O2 baseline Restrictions Weight Bearing Restrictions Per Provider Order: No     Mobility  Bed Mobility               General bed mobility comments: sitting up at edge of bed    Transfers Overall transfer level: Independent Equipment used: None Transfers: Sit to/from Stand Sit to Stand: Independent                Ambulation/Gait Ambulation/Gait assistance: Supervision Gait Distance (Feet): 60 Feet Assistive device: None Gait Pattern/deviations: Step-through pattern, Decreased stride length Gait  velocity: functional     General Gait Details: SpO2 87% on 5L O2 with walking, 88-90% on 6L O2 walking, good pursed lip breathing technique. Pt with 4/4 dyspnea with walking, reports his "legs feel like rubber". Required 4 minutes seated rest for SpO2 to come up to 94% on 5L. Dyspnea limits activity tolerance. Stairs deferred 2* dyspnea and BLEs feeling weak.   Stairs             Wheelchair Mobility     Tilt Bed    Modified Rankin (Stroke Patients Only)       Balance Overall balance assessment: No apparent balance deficits (not formally assessed)   Sitting balance-Leahy Scale: Good       Standing balance-Leahy Scale: Good                              Communication Communication Communication: No apparent difficulties  Cognition Arousal: Alert Behavior During Therapy: WFL for tasks assessed/performed   PT - Cognitive impairments: No apparent impairments                       PT - Cognition Comments: AxO x 3 pleasant.  Lives with a Electronics engineer    Exercises      General Comments        Pertinent Vitals/Pain Pain Assessment Pain Score: 0-No pain    Home Living  Prior Function            PT Goals (current goals can now be found in the care plan section) Acute Rehab PT Goals Patient Stated Goal: "not be short of breath", to be able to go up 5 steps to his bedroom PT Goal Formulation: With patient Time For Goal Achievement: 05/20/23 Potential to Achieve Goals: Good Progress towards PT goals: Progressing toward goals    Frequency    Min 3X/week      PT Plan      Co-evaluation              AM-PAC PT "6 Clicks" Mobility   Outcome Measure  Help needed turning from your back to your side while in a flat bed without using bedrails?: None Help needed moving from lying on your back to sitting on the side of a flat bed without using bedrails?: None Help needed moving to  and from a bed to a chair (including a wheelchair)?: None Help needed standing up from a chair using your arms (e.g., wheelchair or bedside chair)?: None Help needed to walk in hospital room?: None Help needed climbing 3-5 steps with a railing? : A Little 6 Click Score: 23    End of Session Equipment Utilized During Treatment: Oxygen Activity Tolerance: Treatment limited secondary to medical complications (Comment) (4/4 dyspnea with activity) Patient left: in bed;with call bell/phone within reach Nurse Communication: Mobility status PT Visit Diagnosis: Other abnormalities of gait and mobility (R26.89)     Time: 1610-9604 PT Time Calculation (min) (ACUTE ONLY): 20 min  Charges:    $Gait Training: 8-22 mins PT General Charges $$ ACUTE PT VISIT: 1 Visit                     Tamala Ser PT 05/11/2023  Acute Rehabilitation Services  Office [phone]

## 2023-05-12 DIAGNOSIS — J441 Chronic obstructive pulmonary disease with (acute) exacerbation: Secondary | ICD-10-CM | POA: Diagnosis not present

## 2023-05-12 LAB — CREATININE, SERUM
Creatinine, Ser: 0.88 mg/dL (ref 0.61–1.24)
GFR, Estimated: >60 mL/min (ref >60)

## 2023-05-12 NOTE — Progress Notes (Addendum)
 SATURATION QUALIFICATIONS: (This note is used to comply with regulatory documentation for home oxygen)  Patient Saturations on Room Air at Rest = 88% HR 95  Patient Saturations on Room Air while Ambulating = N/A Unable to safely assess. Do not attempt per RT.  Patient Saturations on 5 Liters of oxygen while at rest = 96%  HR 95  Patient Saturations on 6 Liters of oxygen while Ambulating = 90% HR 105

## 2023-05-12 NOTE — Progress Notes (Signed)
 PROGRESS NOTE  ISSAI WERLING  WUJ:[id number] DOB: [birth year] DOA: 05/05/2023 PCP: [name]   Brief Narrative:  Mario [name] is a 73 y.o. male with medical history significant of COPD, lung cancer, Chronic shortness of breath at exertion who has had recurrent hospitalizations with acute on chronic hypoxic respiratory failure secondary to right lower lobe pneumonia and COPD exacerbation.  Currently awaiting safe dispo plan including higher capacity oxygen concentrator as well as working on stairs with PT. Respiratory status is stable.  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 Home concentrator will only go up to 5L. Will need to remain in hospital until can receive a new concentrator that will be able to go up higher. TOC has submitted to [facility] for new equipment.  - wean O2 as tolerated - breathing treatments - completed steroids, Abx - TOC consulted.  - PT/OT - ambulate as much as possible   Adjustment disorder with mixed disturbance of emotions and conduct:  Will continue with home regimen and counseling.    Diabetes Mellitus II : Diet controlled.   Resume sliding scale insulin. Continue to monitor   Essential hypertension:  Continue hydralazine and diltiazem.    - added carvedilol for tachycardia with exertion seems to have tolerated well. continue   Chronic pain syndrome:  Resume home regimen of oxycodone.   Non-small cell cancer of the left lung:  Continue follow-up with oncology at the [facility].  Currently not on treatment.   History of pulmonary embolism: neg PE study on original admission   Chronic kidney disease stage IIIa:  Appears to be at baseline.  Continue to monitor   Morbid obesity: Dietary counseling.   Obstructive sleep apnea: Not on CPAP or BiPAP   BPH: Continue Flomax.    DVT prophylaxis: Lovenox Code Status: Full code Family Communication: No family at bed side. Disposition Plan:    Status is: Inpatient Remains inpatient appropriate because:   Readmitted with worsening shortness of breath.  TOC is aware that he needs oxygen with higher concentrator.   Consultants:  None  Procedures: None Antimicrobials: Anti-infectives (From admission, onward)    None       Subjective: Patient reports feeling well at rest. Unable to go home without his oxygen concentrator increased or unable to go up stairs  Objective: Vitals:   05/11/23 1935 05/11/23 1953 05/11/23 2223 05/12/23 0550  BP:  130/73 130/73 (!) 142/68  Pulse:  92  78  Resp:  16  18  Temp:  (!) 97.4 F (36.3 C)  97.6 F (36.4 C)  TempSrc:  Oral  Oral  SpO2: 93% 92%  98%  Weight:      Height:        Intake/Output Summary (Last 24 hours) at 05/12/2023 0747  Last data filed at 05/12/2023 0554 Gross per 24 hour  Intake 720 ml  Output 1875 ml  Net -1155 ml   Filed Weights   05/05/23 2000  Weight: 135.3 kg    Examination:  General exam: Appears comfortable, morbidly obese, not in any acute distress. Respiratory system: CTA Bilaterally . Respiratory effort normal.  Cardiovascular system: S1 & S2 heard, RRR. No JVD, murmurs, rubs, gallops or clicks.  Central nervous system: Alert and oriented X 3. No focal neurological deficits. Extremities: No edema,  No cyanosis, No clubbing Skin: No rashes, lesions or ulcers Psychiatry: Judgement and insight appear normal. Mood & affect appropriate.   Data Reviewed: I have personally reviewed following labs and imaging studies  CBC: Recent Labs  Lab 05/06/23 0743 05/08/23 0501  WBC 10.8* 11.0*  HGB 12.5* 11.3*  HCT 40.6 36.0*  MCV 93.5 91.6  PLT 458* 418*   Basic Metabolic Panel: Recent Labs  Lab 05/06/23 0743 05/08/23 0501 05/12/23 0607  NA 133* 134*  --   K 4.0 3.5  --   CL 99 100  --   CO2 25 26  --   GLUCOSE 107* 136*  --   BUN 15 17  --   CREATININE 0.75 0.81 0.88  CALCIUM 8.5* 8.3*  --   MG  --  2.1  --   PHOS  --  3.3  --    GFR: Estimated Creatinine Clearance: 112.6 mL/min (by C-G formula based on SCr of 0.88 mg/dL). Liver Function Tests: Recent Labs  Lab 05/06/23 0743  AST 10*  ALT 14  ALKPHOS 79  BILITOT 0.6  PROT 7.2  ALBUMIN 3.6   Radiology Studies: No results found.  Scheduled Meds:  arformoterol  15 mcg Nebulization BID   And   umeclidinium bromide  1 puff Inhalation Daily   buPROPion  300 mg Oral q morning   busPIRone  15 mg Oral Daily   carvedilol  20 mg Oral Daily   cholecalciferol  1,000 Units Oral Daily   cycloSPORINE  1 drop Both Eyes Q12H   diltiazem  180 mg Oral Daily   DULoxetine  60 mg Oral BID   enoxaparin (LOVENOX) injection  60 mg Subcutaneous Q24H   fluticasone  1 spray Each Nare Daily   gabapentin  100 mg Oral TID   hydrALAZINE  25 mg Oral TID   melatonin  6 mg Oral QHS   multivitamin with minerals  1 tablet Oral Daily   naphazoline-pheniramine  1 drop Both Eyes TID   Oxcarbazepine  600 mg Oral TID   pantoprazole  40 mg Oral Daily   polyethylene glycol  17 g Oral BID   psyllium  1 packet Oral Daily   tamsulosin  0.8 mg Oral Daily   traZODone  75 mg Oral QHS   Continuous Infusions:   LOS: 7 days    Time spent: 35 mins    Leeroy Bock, MD [facility]   If 7PM-7AM, please contact night-coverage

## 2023-05-12 NOTE — TOC Progression Note (Addendum)
 Transition of Care [facility]) - Progression Note    Patient Details  Name: Mario [name] MRN: [id number] Date of Birth: 09-14-1950  [facility] CM/SW Contact  Beckie Busing, RN Phone [phone]  05/12/2023, 11:30 AM  Clinical Narrative:    Home oxygen discharge qualification form with documentation has been faxed to [facility] for approval for patient with increased oxygen requirements. Awaiting approval. Patient is currently on home oxygen but the concentrator that he has does not have liter flow that is adequate for the patients needs. TOC following.   CM spoke with [employer] in reference to patients home oxygen, Company confirms that patient does have O2 with them but can not change concentrator until they receive confirmation of increased O2 needs from the [place].    Expected Discharge Plan: Home w Home Health Services Barriers to Discharge: No Barriers Identified  Expected Discharge Plan and Services In-house Referral: NA Discharge Planning Services: CM Consult Post Acute Care Choice: Home Health Living arrangements for the past 2 months: Single Family Home                 DME Arranged: N/A DME Agency: NA       HH Arranged: PT HH Agency:  [facility]) Date HH Agency Contacted: 05/06/23 Time HH Agency Contacted: 1529 Representative spoke with at [facility] Agency: Clifton Custard   Social Determinants of Health (SDOH) Interventions SDOH Screenings   Food Insecurity: No Food Insecurity (05/05/2023)  Housing: Low Risk  (05/05/2023)  Transportation Needs: No Transportation Needs (05/05/2023)  Utilities: Not At Risk (05/05/2023)  Financial Resource Strain: Low Risk  (09/23/2020)  Social Connections: Socially Isolated (05/05/2023)  Stress: No Stress Concern Present (09/23/2020)  Tobacco Use: Medium Risk (05/05/2023)    Readmission Risk Interventions    05/06/2023    3:25 PM 04/24/2023   10:09 AM 03/25/2023    2:23 PM  Readmission Risk Prevention Plan  Transportation  Screening Complete Complete Complete  Medication Review Oceanographer) Complete Complete Complete  PCP or Specialist appointment within 3-5 days of discharge Complete    HRI or Home Care Consult Complete Complete Complete  SW Recovery Care/Counseling Consult Complete Complete Complete  Palliative Care Screening Not Applicable Not Applicable Not Applicable  Skilled Nursing Facility Not Applicable Not Applicable Not Applicable

## 2023-05-12 NOTE — Plan of Care (Signed)
  Problem: Clinical Measurements: Goal: Will remain free from infection Outcome: Progressing   Problem: Activity: Goal: Risk for activity intolerance will decrease Outcome: Progressing   Problem: Elimination: Goal: Will not experience complications related to bowel motility Outcome: Progressing Goal: Will not experience complications related to urinary retention Outcome: Progressing   Problem: Pain Managment: Goal: General experience of comfort will improve and/or be controlled Outcome: Progressing   Problem: Safety: Goal: Ability to remain free from injury will improve Outcome: Progressing

## 2023-05-12 NOTE — Progress Notes (Addendum)
 Mobility Specialist - Progress Note   Pre-mobility: 95 bpm HR, 96% SpO2 ([place] 5L) During mobility: 105 bpm HR, 90% SpO2 ([place] 6L) Post-mobility: 95 bpm HR, 95% SPO2 ([place] 5L)   05/12/23 0908  Mobility  Activity Ambulated independently in hallway  Level of Assistance Independent  Assistive Device None  Distance Ambulated (ft) 100 ft  Range of Motion/Exercises Active  Activity Response Tolerated fair  Mobility Referral Yes  Mobility visit 1 Mobility  Mobility Specialist Start Time (ACUTE ONLY) 0855  Mobility Specialist Stop Time (ACUTE ONLY) 0908  Mobility Specialist Time Calculation (min) (ACUTE ONLY) 13 min   Pt was found in bed and agreeable to ambulate. Maintained SPO2 >90% during ambulation, although upon returning to sit EOB SPO2 decreased to 87%. Able to increase >90% within . At EOS was left in room with all needs met and RRT in room.  Billey Chang Mobility Specialist

## 2023-05-13 DIAGNOSIS — J441 Chronic obstructive pulmonary disease with (acute) exacerbation: Secondary | ICD-10-CM | POA: Diagnosis not present

## 2023-05-13 DIAGNOSIS — F4325 Adjustment disorder with mixed disturbance of emotions and conduct: Secondary | ICD-10-CM | POA: Diagnosis not present

## 2023-05-13 DIAGNOSIS — J189 Pneumonia, unspecified organism: Secondary | ICD-10-CM

## 2023-05-13 MED ORDER — GUAIFENESIN ER 600 MG PO TB12
1200.0000 mg | ORAL_TABLET | Freq: Two times a day (BID) | ORAL | Status: DC | PRN
  Administered 2023-05-13: 1200 mg via ORAL
  Filled 2023-05-13: qty 2

## 2023-05-13 NOTE — TOC Progression Note (Signed)
 Transition of Care [facility]) - Progression Note   Patient Details  Name: Mario [name] MRN: [id number] Date of Birth: [birth year]  Transition of Care [facility]) CM/SW Contact  Ewing Schlein, LCSW Phone Number: 05/13/2023, 1:20 PM  Clinical Narrative: TOC did not hear back from the [place] regarding approval for increased home oxygen needs and cannot be reached on the weekend.  Expected Discharge Plan: Home w Home Health Services Barriers to Discharge: No Barriers Identified  Expected Discharge Plan and Services In-house Referral: NA Discharge Planning Services: CM Consult Post Acute Care Choice: Home Health Living arrangements for the past 2 months: Single Family Home          DME Arranged: N/A DME Agency: NA HH Arranged: PT HH Agency:  [facility]) Date HH Agency Contacted: 05/06/23 Time HH Agency Contacted: 1529 Representative spoke with at [facility] Agency: Clifton Custard  Social Determinants of Health (SDOH) Interventions SDOH Screenings   Food Insecurity: No Food Insecurity (05/05/2023)  Housing: Low Risk  (05/05/2023)  Transportation Needs: No Transportation Needs (05/05/2023)  Utilities: Not At Risk (05/05/2023)  Financial Resource Strain: Low Risk  (09/23/2020)  Social Connections: Socially Isolated (05/05/2023)  Stress: No Stress Concern Present (09/23/2020)  Tobacco Use: Medium Risk (05/05/2023)   Readmission Risk Interventions    05/06/2023    3:25 PM 04/24/2023   10:09 AM 03/25/2023    2:23 PM  Readmission Risk Prevention Plan  Transportation Screening Complete Complete Complete  Medication Review Oceanographer) Complete Complete Complete  PCP or Specialist appointment within 3-5 days of discharge Complete    HRI or Home Care Consult Complete Complete Complete  SW Recovery Care/Counseling Consult Complete Complete Complete  Palliative Care Screening Not Applicable Not Applicable Not Applicable  Skilled Nursing Facility Not Applicable Not Applicable Not Applicable

## 2023-05-13 NOTE — Progress Notes (Signed)
  Progress Note   Patient: Mario [name]:[id number] DOB: 1950-08-24 DOA: 05/05/2023     8 DOS: the patient was seen and examined on 05/13/2023        Brief hospital course: 73 y.o. M with recently diagnosed lung CA, hx COPD and chronic respiratory failure on 2L home O2, who presented with hypoxia.  Had recently been admitted 2/28-3/7 for aspiration pneumonia, discharged on home O2 but couldn't make it to his car in the parking lot, became severely SOB and with tachycardia to 150, so returned to the ER.      Assessment and Plan: Recent aspiration pneumonia Resolving acute on chronic hypoxic respiratory failure Chronic hypoxic respiratory failure due to COPD, cancer - Continue supplemental O2 - Continue Brovana, Incruse    Chronic pain syndrome - Continue oxycodone 10 - Continue gabapentin, Cymbalta  Hypertension BP normal - Continue Coreg, diltiazem, hydralazine  Mood disorder -Continue bupropion, BuSpar, duloxetine  CKD stage IIIa Creatinine stable relative to baseline  Morbid obesity Class III  Sleep apnea not on BiPAP  Home medication of unclear indication - Continue home Trileptal  Diet controlled diabetes Glucose normal, okay to defer SSI       Subjective: No new complaints, no nursing concerns.     Physical Exam: BP 131/73   Pulse 85   Temp 97.6 F (36.4 C) (Oral)   Resp 14   Ht 6\' 2"  (1.88 m)   Wt 135.3 kg   SpO2 97%   BMI 38.30 kg/m   Adult male, sitting on the edge of the bed, no acute distress RRR, no murmurs, no peripheral edema Respiratory normal, diminished overall, crackles in the bases, fine, no wheezing Abdomen soft no tenderness palpation or guarding, no ascites or distention Attention normal, affect appropriate, judgment and insight are normal   Data Reviewed: No new labs  Family Communication:     Disposition: Status is: Inpatient Improving but will need concentrator at home that can handle 4-5L o2 and also  will need to improve to handle stairs.   Possibly home Monday if arranged.        Author: Alberteen Sam, MD 05/13/2023 4:14 PM  For on call review [email or web address].

## 2023-05-13 NOTE — Plan of Care (Signed)
  Problem: Education: Goal: Knowledge of General Education information will improve Description: Including pain rating scale, medication(s)/side effects and non-pharmacologic comfort measures Outcome: Progressing   Problem: Health Behavior/Discharge Planning: Goal: Ability to manage health-related needs will improve Outcome: Progressing   Problem: Clinical Measurements: Goal: Ability to maintain clinical measurements within normal limits will improve Outcome: Progressing Goal: Will remain free from infection Outcome: Progressing Goal: Diagnostic test results will improve Outcome: Progressing Goal: Respiratory complications will improve Outcome: Progressing Goal: Cardiovascular complication will be avoided Outcome: Progressing   Problem: Activity: Goal: Risk for activity intolerance will decrease Outcome: Progressing   Problem: Nutrition: Goal: Adequate nutrition will be maintained Outcome: Progressing   Problem: Coping: Goal: Level of anxiety will decrease Outcome: Progressing   Problem: Elimination: Goal: Will not experience complications related to bowel motility Outcome: Progressing Goal: Will not experience complications related to urinary retention Outcome: Progressing   Problem: Pain Managment: Goal: General experience of comfort will improve and/or be controlled Outcome: Progressing   Problem: Safety: Goal: Ability to remain free from injury will improve Outcome: Progressing   Problem: Skin Integrity: Goal: Risk for impaired skin integrity will decrease Outcome: Progressing   Problem: Education: Goal: Knowledge of disease or condition will improve Outcome: Progressing Goal: Knowledge of the prescribed therapeutic regimen will improve Outcome: Progressing   Problem: Activity: Goal: Ability to tolerate increased activity will improve Outcome: Progressing Goal: Will verbalize the importance of balancing activity with adequate rest periods Outcome:  Progressing   Problem: Respiratory: Goal: Ability to maintain a clear airway will improve Outcome: Progressing Goal: Levels of oxygenation will improve Outcome: Progressing Goal: Ability to maintain adequate ventilation will improve Outcome: Progressing

## 2023-05-14 DIAGNOSIS — J189 Pneumonia, unspecified organism: Secondary | ICD-10-CM | POA: Diagnosis not present

## 2023-05-14 DIAGNOSIS — J441 Chronic obstructive pulmonary disease with (acute) exacerbation: Secondary | ICD-10-CM | POA: Diagnosis not present

## 2023-05-14 DIAGNOSIS — R0600 Dyspnea, unspecified: Secondary | ICD-10-CM | POA: Diagnosis not present

## 2023-05-14 DIAGNOSIS — C3490 Malignant neoplasm of unspecified part of unspecified bronchus or lung: Secondary | ICD-10-CM | POA: Diagnosis not present

## 2023-05-14 LAB — COMPREHENSIVE METABOLIC PANEL
ALT: 17 U/L (ref 0–44)
AST: 15 U/L (ref 15–41)
Albumin: 3.3 g/dL — ABNORMAL LOW (ref 3.5–5.0)
Alkaline Phosphatase: 77 U/L (ref 38–126)
Anion gap: 10 (ref 5–15)
BUN: 15 mg/dL (ref 8–23)
CO2: 29 mmol/L (ref 22–32)
Calcium: 8.6 mg/dL — ABNORMAL LOW (ref 8.9–10.3)
Chloride: 95 mmol/L — ABNORMAL LOW (ref 98–111)
Creatinine, Ser: 0.76 mg/dL (ref 0.61–1.24)
GFR, Estimated: >60 mL/min (ref >60)
Glucose, Bld: 119 mg/dL — ABNORMAL HIGH (ref 70–99)
Potassium: 4.3 mmol/L (ref 3.5–5.1)
Sodium: 134 mmol/L — ABNORMAL LOW (ref 135–145)
Total Bilirubin: 0.6 mg/dL (ref 0.0–1.2)
Total Protein: 6.2 g/dL — ABNORMAL LOW (ref 6.5–8.1)

## 2023-05-14 LAB — CBC
HCT: 39.9 % (ref 39.0–52.0)
Hemoglobin: 12.2 g/dL — ABNORMAL LOW (ref 13.0–17.0)
MCH: 28.2 pg (ref 26.0–34.0)
MCHC: 30.6 g/dL (ref 30.0–36.0)
MCV: 92.1 fL (ref 80.0–100.0)
Platelets: 389 10*3/uL (ref 150–400)
RBC: 4.33 MIL/uL (ref 4.22–5.81)
RDW: 12.9 % (ref 11.5–15.5)
WBC: 11.9 10*3/uL — ABNORMAL HIGH (ref 4.0–10.5)
nRBC: 0 % (ref 0.0–0.2)

## 2023-05-14 MED ORDER — PHENOL 1.4 % MT LIQD
1.0000 | OROMUCOSAL | Status: DC | PRN
  Filled 2023-05-14: qty 177

## 2023-05-14 MED ORDER — MILK AND MOLASSES ENEMA
1.0000 | Freq: Once | RECTAL | Status: AC
  Administered 2023-05-14: 240 mL via RECTAL
  Filled 2023-05-14: qty 240

## 2023-05-14 NOTE — Progress Notes (Signed)
 [facility]  PROGRESS NOTE  Mario [name]:[id number] DOB: 1950/07/10 DOA: 05/05/2023 PCP: [name]   Brief HPI:   73 y.o. M with recently diagnosed lung CA, hx COPD and chronic respiratory failure on 2L home O2, who presented with hypoxia.   Had recently been admitted 2/28-3/7 for aspiration pneumonia, discharged on home O2 but couldn't make it to his car in the parking lot, became severely SOB and with tachycardia to 150, so returned to the ER.    Assessment/Plan:   Recent aspiration pneumonia Resolving acute on chronic hypoxic respiratory failure Chronic hypoxic respiratory failure due to COPD, cancer - Continue supplemental O2 - Continue Brovana, Incruse -Awaiting oxygen concentrator at home, likely discharge home in a.m.   Sore throat -Continue phenol spray as needed -No erythema noted on the exam   Chronic pain syndrome - Continue oxycodone 10 - Continue gabapentin, Cymbalta   Hypertension BP normal - Continue Coreg, diltiazem, hydralazine   Mood disorder -Continue bupropion, BuSpar, duloxetine   CKD stage IIIa Creatinine stable relative to baseline   Morbid obesity Class III   Sleep apnea not on BiPAP   Home medication of unclear indication - Continue home Trileptal   Diet controlled diabetes Glucose normal, okay to defer SSI    Medications     arformoterol  15 mcg Nebulization BID   And   umeclidinium bromide  1 puff Inhalation Daily   buPROPion  300 mg Oral q morning   busPIRone  15 mg Oral Daily   carvedilol  20 mg Oral Daily   cholecalciferol  1,000 Units Oral Daily   cycloSPORINE  1 drop Both Eyes Q12H   diltiazem  180 mg Oral Daily   DULoxetine  60 mg Oral BID   enoxaparin (LOVENOX) injection  60 mg Subcutaneous Q24H   fluticasone  1 spray Each Nare Daily   gabapentin  100 mg Oral TID   hydrALAZINE  25 mg Oral TID   melatonin  6 mg Oral QHS   multivitamin with minerals  1 tablet Oral Daily    naphazoline-pheniramine  1 drop Both Eyes TID   Oxcarbazepine  600 mg Oral TID   pantoprazole  40 mg Oral Daily   polyethylene glycol  17 g Oral BID   psyllium  1 packet Oral Daily   tamsulosin  0.8 mg Oral Daily   traZODone  75 mg Oral QHS     Data Reviewed:   CBG:  No results for input(s): "GLUCAP" in the last 168 hours.  SpO2: 98 % O2 Flow Rate (L/min): 5 L/min FiO2 (%): 40 %    Vitals:   05/13/23 2042 05/13/23 2227 05/14/23 0552 05/14/23 0802  BP:  120/67 (!) 150/85   Pulse:  83 77   Resp:  14 14   Temp:  98 F (36.7 C) 97.6 F (36.4 C)   TempSrc:  Oral Oral   SpO2: 96% 94% 98% 98%  Weight:      Height:          Data Reviewed:  Basic Metabolic Panel: Recent Labs  Lab 05/08/23 0501 05/12/23 0607 05/14/23 0614  NA 134*  --  134*  K 3.5  --  4.3  CL 100  --  95*  CO2 26  --  29  GLUCOSE 136*  --  119*  BUN 17  --  15  CREATININE 0.81 0.88 0.76  CALCIUM 8.3*  --  8.6*  MG 2.1  --   --  PHOS 3.3  --   --     CBC: Recent Labs  Lab 05/08/23 0501 05/14/23 0614  WBC 11.0* 11.9*  HGB 11.3* 12.2*  HCT 36.0* 39.9  MCV 91.6 92.1  PLT 418* 389    LFT Recent Labs  Lab 05/14/23 0614  AST 15  ALT 17  ALKPHOS 77  BILITOT 0.6  PROT 6.2*  ALBUMIN 3.3*     Antibiotics: Anti-infectives (From admission, onward)    None        DVT prophylaxis: Lovenox  Code Status: Full code  Family Communication: No family present at bedside   CONSULTS none   Subjective   Complains of sore throat this morning.  Denies shortness of breath.   Objective    Physical Examination:   General-appears in no acute distress HEENT-no pharyngeal erythema noted on exam Heart-S1-S2, regular, no murmur auscultated Lungs-clear to auscultation bilaterally, no wheezing or crackles auscultated Abdomen-soft, nontender, no organomegaly Extremities-no edema in the lower extremities Neuro-alert, oriented x3, no focal deficit noted  Status is: Inpatient:              Meredeth Ide   [facility] If 7PM-7AM, please contact night-coverage at [email or web address], Office  [phone]   05/14/2023, 9:22 AM  LOS: 9 days

## 2023-05-14 NOTE — Plan of Care (Signed)
  Problem: Education: Goal: Knowledge of General Education information will improve Description: Including pain rating scale, medication(s)/side effects and non-pharmacologic comfort measures Outcome: Progressing   Problem: Health Behavior/Discharge Planning: Goal: Ability to manage health-related needs will improve Outcome: Progressing   Problem: Clinical Measurements: Goal: Ability to maintain clinical measurements within normal limits will improve Outcome: Progressing Goal: Will remain free from infection Outcome: Progressing Goal: Diagnostic test results will improve Outcome: Progressing Goal: Respiratory complications will improve Outcome: Progressing Goal: Cardiovascular complication will be avoided Outcome: Progressing   Problem: Activity: Goal: Risk for activity intolerance will decrease Outcome: Progressing   Problem: Nutrition: Goal: Adequate nutrition will be maintained Outcome: Progressing   Problem: Coping: Goal: Level of anxiety will decrease Outcome: Progressing   Problem: Elimination: Goal: Will not experience complications related to bowel motility Outcome: Progressing Goal: Will not experience complications related to urinary retention Outcome: Progressing   Problem: Pain Managment: Goal: General experience of comfort will improve and/or be controlled Outcome: Progressing   Problem: Safety: Goal: Ability to remain free from injury will improve Outcome: Progressing   Problem: Skin Integrity: Goal: Risk for impaired skin integrity will decrease Outcome: Progressing   Problem: Education: Goal: Knowledge of disease or condition will improve Outcome: Progressing Goal: Knowledge of the prescribed therapeutic regimen will improve Outcome: Progressing   Problem: Activity: Goal: Ability to tolerate increased activity will improve Outcome: Progressing Goal: Will verbalize the importance of balancing activity with adequate rest periods Outcome:  Progressing   Problem: Respiratory: Goal: Ability to maintain a clear airway will improve Outcome: Progressing Goal: Levels of oxygenation will improve Outcome: Progressing Goal: Ability to maintain adequate ventilation will improve Outcome: Progressing

## 2023-05-14 NOTE — Progress Notes (Signed)
 Physical Therapy Treatment Patient Details Name: Mario [name] MRN: [id number] DOB: [birth year] Today's Date: 05/14/2023   History of Present Illness Mario [name] is a 73 y.o. male returns from parking lot of hospital with shortness of breath, admitted 05/05/23. Dx of acute on chronic respiratory failure, PNA. Of note, recent hospitalization 2/28-3/7 with acute on chronic hypoxic respiratory failure secondary to right lower lobe pneumonia and COPD exacerbation, suicidal ideation, multiple medical problems.    PMH: shortness of breath as a result of COPD, lung cancer, hx of PE    PT Comments  Pt assisted with ambulating in hallway however only tolerating short distances at this time due to dyspnea.  SPO2 also decreasing to 84% on 6L upon returning to room after ambulating.  Pt requiring approx 2 minutes to recover and improve SpO2 into upper 90s prior to returning to 5L.  Pt aware he will need to perform short distances with frequent rest breaks upon d/c. Pt anticipates d/c home tomorrow.     If plan is discharge home, recommend the following: Help with stairs or ramp for entrance;Assistance with cooking/housework;Assist for transportation   Can travel by private vehicle        Equipment Recommendations  None recommended by PT    Recommendations for Other Services       Precautions / Restrictions Precautions Precautions: Fall Recall of Precautions/Restrictions: Intact Precaution/Restrictions Comments: Monitor O2, 5L O2 baseline     Mobility  Bed Mobility Overal bed mobility: Independent                  Transfers Overall transfer level: Independent                      Ambulation/Gait Ambulation/Gait assistance: Supervision Gait Distance (Feet): 40 Feet (x2) Assistive device: None Gait Pattern/deviations: Step-through pattern, Decreased stride length       General Gait Details: 40'x2 with short standing rest break; Spo2 mostly 91% on 6L during  ambulation until return to room and SPO2 dropped to 84% and required approx 2 min to recover back into upper 90s; SPO2 98% on 5L upon therapist leaving room; dyspnea 3/4   Stairs             Wheelchair Mobility     Tilt Bed    Modified Rankin (Stroke Patients Only)       Balance                                            Communication Communication Communication: No apparent difficulties  Cognition Arousal: Alert Behavior During Therapy: WFL for tasks assessed/performed   PT - Cognitive impairments: No apparent impairments                         Following commands: Intact      Cueing    Exercises      General Comments        Pertinent Vitals/Pain Pain Assessment Pain Assessment: No/denies pain Pain Intervention(s): Monitored during session, Repositioned    Home Living                          Prior Function            PT Goals (current goals can now be found in the care plan section)  Progress towards PT goals: Progressing toward goals    Frequency    Min 3X/week      PT Plan      Co-evaluation              AM-PAC PT "6 Clicks" Mobility   Outcome Measure  Help needed turning from your back to your side while in a flat bed without using bedrails?: None Help needed moving from lying on your back to sitting on the side of a flat bed without using bedrails?: None Help needed moving to and from a bed to a chair (including a wheelchair)?: None Help needed standing up from a chair using your arms (e.g., wheelchair or bedside chair)?: None Help needed to walk in hospital room?: None Help needed climbing 3-5 steps with a railing? : A Little 6 Click Score: 23    End of Session Equipment Utilized During Treatment: Oxygen Activity Tolerance: Patient tolerated treatment well Patient left: in bed;with call bell/phone within reach   PT Visit Diagnosis: Difficulty in walking, not elsewhere classified  (R26.2)     Time: 1610-9604 PT Time Calculation (min) (ACUTE ONLY): 14 min  Charges:    $Gait Training: 8-22 mins PT General Charges $$ ACUTE PT VISIT: 1 Visit                     Paulino Door, DPT Physical Therapist Acute Rehabilitation Services Office: [phone]  Mario [name] 05/14/2023, 4:42 PM

## 2023-05-15 DIAGNOSIS — J189 Pneumonia, unspecified organism: Secondary | ICD-10-CM | POA: Diagnosis not present

## 2023-05-15 DIAGNOSIS — C3490 Malignant neoplasm of unspecified part of unspecified bronchus or lung: Secondary | ICD-10-CM | POA: Diagnosis not present

## 2023-05-15 DIAGNOSIS — J441 Chronic obstructive pulmonary disease with (acute) exacerbation: Secondary | ICD-10-CM | POA: Diagnosis not present

## 2023-05-15 DIAGNOSIS — R0600 Dyspnea, unspecified: Secondary | ICD-10-CM | POA: Diagnosis not present

## 2023-05-15 NOTE — Progress Notes (Signed)
 [facility]  PROGRESS NOTE  Mario [name] VHQ:[id number] DOB: August 17, 1950 DOA: 05/05/2023 PCP: [name]   Brief HPI:   73 y.o. M with recently diagnosed lung CA, hx COPD and chronic respiratory failure on 2L home O2, who presented with hypoxia.   Had recently been admitted 2/28-3/7 for aspiration pneumonia, discharged on home O2 but couldn't make it to his car in the parking lot, became severely SOB and with tachycardia to 150, so returned to the ER.    Assessment/Plan:   Recent aspiration pneumonia Resolving acute on chronic hypoxic respiratory failure Chronic hypoxic respiratory failure due to COPD, cancer - Continue supplemental O2 - Continue Brovana, Incruse -Awaiting oxygen concentrator at home, likely discharge home in a.m. -Patient roommate has not at home today.  Will likely be home tomorrow.   Sore throat -Continue phenol spray as needed -No erythema noted on the exam   Chronic pain syndrome - Continue oxycodone 10 - Continue gabapentin, Cymbalta   Hypertension BP normal - Continue Coreg, diltiazem, hydralazine   Mood disorder -Continue bupropion, BuSpar, duloxetine   CKD stage IIIa Creatinine stable relative to baseline   Morbid obesity Class III   Sleep apnea not on BiPAP   Home medication of unclear indication - Continue home Trileptal   Diet controlled diabetes Glucose normal, okay to defer SSI    Medications     arformoterol  15 mcg Nebulization BID   And   umeclidinium bromide  1 puff Inhalation Daily   buPROPion  300 mg Oral q morning   busPIRone  15 mg Oral Daily   carvedilol  20 mg Oral Daily   cholecalciferol  1,000 Units Oral Daily   cycloSPORINE  1 drop Both Eyes Q12H   diltiazem  180 mg Oral Daily   DULoxetine  60 mg Oral BID   enoxaparin (LOVENOX) injection  60 mg Subcutaneous Q24H   fluticasone  1 spray Each Nare Daily   gabapentin  100 mg Oral TID   hydrALAZINE  25 mg Oral TID   melatonin  6 mg Oral  QHS   multivitamin with minerals  1 tablet Oral Daily   naphazoline-pheniramine  1 drop Both Eyes TID   Oxcarbazepine  600 mg Oral TID   pantoprazole  40 mg Oral Daily   polyethylene glycol  17 g Oral BID   psyllium  1 packet Oral Daily   tamsulosin  0.8 mg Oral Daily   traZODone  75 mg Oral QHS     Data Reviewed:   CBG:  No results for input(s): "GLUCAP" in the last 168 hours.  SpO2: 93 % O2 Flow Rate (L/min): 5 L/min FiO2 (%): 40 %    Vitals:   05/14/23 2142 05/15/23 0538 05/15/23 0817 05/15/23 1343  BP: 135/84 129/68  122/69  Pulse: 94 76  88  Resp: 16 14  20   Temp: 98.2 F (36.8 C) (!) 97.5 F (36.4 C)  (!) 97.5 F (36.4 C)  TempSrc: Oral Oral  Oral  SpO2: 95% 100% 100% 93%  Weight:      Height:          Data Reviewed:  Basic Metabolic Panel: Recent Labs  Lab 05/12/23 0607 05/14/23 0614  NA  --  134*  K  --  4.3  CL  --  95*  CO2  --  29  GLUCOSE  --  119*  BUN  --  15  CREATININE 0.88 0.76  CALCIUM  --  8.6*  CBC: Recent Labs  Lab 05/14/23 0614  WBC 11.9*  HGB 12.2*  HCT 39.9  MCV 92.1  PLT 389    LFT Recent Labs  Lab 05/14/23 0614  AST 15  ALT 17  ALKPHOS 77  BILITOT 0.6  PROT 6.2*  ALBUMIN 3.3*     Antibiotics: Anti-infectives (From admission, onward)    None        DVT prophylaxis: Lovenox  Code Status: Full code  Family Communication: No family present at bedside   CONSULTS none   Subjective   Denies shortness of breath.   Objective    Physical Examination:   General-appears in no acute distress Heart-S1-S2, regular, no murmur auscultated Lungs-clear to auscultation bilaterally, no wheezing or crackles auscultated Abdomen-soft, nontender, no organomegaly Extremities-no edema in the lower extremities Neuro-alert, oriented x3, no focal deficit noted  Status is: Inpatient:             Meredeth Ide   [facility] If 7PM-7AM, please contact night-coverage at  [email or web address], Office  [phone]   05/15/2023, 1:45 PM  LOS: 10 days

## 2023-05-15 NOTE — Plan of Care (Signed)
  Problem: Education: Goal: Knowledge of General Education information will improve Description: Including pain rating scale, medication(s)/side effects and non-pharmacologic comfort measures Outcome: Progressing   Problem: Health Behavior/Discharge Planning: Goal: Ability to manage health-related needs will improve Outcome: Progressing   Problem: Clinical Measurements: Goal: Ability to maintain clinical measurements within normal limits will improve Outcome: Progressing Goal: Will remain free from infection Outcome: Progressing Goal: Diagnostic test results will improve Outcome: Progressing Goal: Respiratory complications will improve Outcome: Progressing Goal: Cardiovascular complication will be avoided Outcome: Progressing   Problem: Activity: Goal: Risk for activity intolerance will decrease Outcome: Progressing   Problem: Pain Managment: Goal: General experience of comfort will improve and/or be controlled Outcome: Progressing   Problem: Safety: Goal: Ability to remain free from injury will improve Outcome: Progressing   Problem: Skin Integrity: Goal: Risk for impaired skin integrity will decrease Outcome: Progressing   Problem: Education: Goal: Knowledge of disease or condition will improve Outcome: Progressing Goal: Knowledge of the prescribed therapeutic regimen will improve Outcome: Progressing   Problem: Activity: Goal: Ability to tolerate increased activity will improve Outcome: Progressing Goal: Will verbalize the importance of balancing activity with adequate rest periods Outcome: Progressing   Problem: Respiratory: Goal: Ability to maintain a clear airway will improve Outcome: Progressing Goal: Levels of oxygenation will improve Outcome: Progressing Goal: Ability to maintain adequate ventilation will improve Outcome: Progressing

## 2023-05-15 NOTE — TOC Progression Note (Addendum)
 Transition of Care [facility]) - Progression Note    Patient Details  Name: Mario [name] MRN: [id number] Date of Birth: 10/03/1950  [facility] CM/SW Contact  Beckie Busing, RN Phone Number:[phone]  05/15/2023, 11:56 AM  Clinical Narrative:    CM received call from [place] at [facility] confirming that all info has been received and that she will process an emergency request for O2 tank that has 10L flow. This information has been given to the patient and patient confirms that he does have his portable tank for transport and roommate will accept new concentrator. MD updated.  1340 Patient now states that he can not go home today because his roommate is not home. Patient has already called  [facility] and told then that no one will be home. MD has been made aware and states that he will discharge patient tomorrow. There are no other TOC needs.      Expected Discharge Plan: Home w Home Health Services Barriers to Discharge: No Barriers Identified  Expected Discharge Plan and Services In-house Referral: NA Discharge Planning Services: CM Consult Post Acute Care Choice: Home Health Living arrangements for the past 2 months: Single Family Home                 DME Arranged: N/A DME Agency: NA       HH Arranged: PT HH Agency:  [facility]) Date HH Agency Contacted: 05/06/23 Time HH Agency Contacted: 1529 Representative spoke with at [facility] Agency: Clifton Custard   Social Determinants of Health (SDOH) Interventions SDOH Screenings   Food Insecurity: No Food Insecurity (05/05/2023)  Housing: Low Risk  (05/05/2023)  Transportation Needs: No Transportation Needs (05/05/2023)  Utilities: Not At Risk (05/05/2023)  Financial Resource Strain: Low Risk  (09/23/2020)  Social Connections: Socially Isolated (05/05/2023)  Stress: No Stress Concern Present (09/23/2020)  Tobacco Use: Medium Risk (05/05/2023)    Readmission Risk Interventions    05/06/2023    3:25 PM 04/24/2023   10:09 AM  03/25/2023    2:23 PM  Readmission Risk Prevention Plan  Transportation Screening Complete Complete Complete  Medication Review [employer]) Complete Complete Complete  PCP or Specialist appointment within 3-5 days of discharge Complete    HRI or Home Care Consult Complete Complete Complete  SW Recovery Care/Counseling Consult Complete Complete Complete  Palliative Care Screening Not Applicable Not Applicable Not Applicable  Skilled Nursing Facility Not Applicable Not Applicable Not Applicable

## 2023-05-16 ENCOUNTER — Emergency Department (HOSPITAL_COMMUNITY)
Admission: EM | Admit: 2023-05-16 | Discharge: 2023-05-16 | Disposition: A | Discharge status: Home / Self Care | Attending: Emergency Medicine | Admitting: Emergency Medicine

## 2023-05-16 ENCOUNTER — Emergency Department (HOSPITAL_COMMUNITY)

## 2023-05-16 ENCOUNTER — Other Ambulatory Visit: Payer: Self-pay

## 2023-05-16 ENCOUNTER — Encounter (HOSPITAL_COMMUNITY): Payer: Self-pay

## 2023-05-16 DIAGNOSIS — E119 Type 2 diabetes mellitus without complications: Secondary | ICD-10-CM | POA: Diagnosis not present

## 2023-05-16 DIAGNOSIS — R0602 Shortness of breath: Secondary | ICD-10-CM | POA: Diagnosis present

## 2023-05-16 DIAGNOSIS — J441 Chronic obstructive pulmonary disease with (acute) exacerbation: Secondary | ICD-10-CM | POA: Insufficient documentation

## 2023-05-16 DIAGNOSIS — Z85118 Personal history of other malignant neoplasm of bronchus and lung: Secondary | ICD-10-CM | POA: Insufficient documentation

## 2023-05-16 DIAGNOSIS — C3490 Malignant neoplasm of unspecified part of unspecified bronchus or lung: Secondary | ICD-10-CM | POA: Diagnosis not present

## 2023-05-16 DIAGNOSIS — R0600 Dyspnea, unspecified: Secondary | ICD-10-CM | POA: Diagnosis not present

## 2023-05-16 DIAGNOSIS — J189 Pneumonia, unspecified organism: Secondary | ICD-10-CM | POA: Diagnosis not present

## 2023-05-16 LAB — CBC WITH DIFFERENTIAL/PLATELET
Abs Immature Granulocytes: 0.09 10*3/uL — ABNORMAL HIGH (ref 0.00–0.07)
Basophils Absolute: 0.1 10*3/uL (ref 0.0–0.1)
Basophils Relative: 1 %
Eosinophils Absolute: 0.2 10*3/uL (ref 0.0–0.5)
Eosinophils Relative: 1 %
HCT: 38.2 % — ABNORMAL LOW (ref 39.0–52.0)
Hemoglobin: 12.4 g/dL — ABNORMAL LOW (ref 13.0–17.0)
Immature Granulocytes: 1 %
Lymphocytes Relative: 10 %
Lymphs Abs: 1.4 10*3/uL (ref 0.7–4.0)
MCH: 28.4 pg (ref 26.0–34.0)
MCHC: 32.5 g/dL (ref 30.0–36.0)
MCV: 87.6 fL (ref 80.0–100.0)
Monocytes Absolute: 0.9 10*3/uL (ref 0.1–1.0)
Monocytes Relative: 7 %
Neutro Abs: 11.1 10*3/uL — ABNORMAL HIGH (ref 1.7–7.7)
Neutrophils Relative %: 80 %
Platelets: 392 10*3/uL (ref 150–400)
RBC: 4.36 MIL/uL (ref 4.22–5.81)
RDW: 12.8 % (ref 11.5–15.5)
WBC: 13.9 10*3/uL — ABNORMAL HIGH (ref 4.0–10.5)
nRBC: 0 % (ref 0.0–0.2)

## 2023-05-16 LAB — RESP PANEL BY RT-PCR (RSV, FLU A&B, COVID)  RVPGX2
Influenza A by PCR: NEGATIVE
Influenza B by PCR: NEGATIVE
Resp Syncytial Virus by PCR: NEGATIVE
SARS Coronavirus 2 by RT PCR: NEGATIVE

## 2023-05-16 LAB — TROPONIN I (HIGH SENSITIVITY)
Troponin I (High Sensitivity): 10 ng/L (ref <18)
Troponin I (High Sensitivity): 8 ng/L (ref <18)

## 2023-05-16 LAB — COMPREHENSIVE METABOLIC PANEL
ALT: 17 U/L (ref 0–44)
AST: 14 U/L — ABNORMAL LOW (ref 15–41)
Albumin: 3.3 g/dL — ABNORMAL LOW (ref 3.5–5.0)
Alkaline Phosphatase: 85 U/L (ref 38–126)
Anion gap: 8 (ref 5–15)
BUN: 12 mg/dL (ref 8–23)
CO2: 27 mmol/L (ref 22–32)
Calcium: 8.6 mg/dL — ABNORMAL LOW (ref 8.9–10.3)
Chloride: 95 mmol/L — ABNORMAL LOW (ref 98–111)
Creatinine, Ser: 0.75 mg/dL (ref 0.61–1.24)
GFR, Estimated: >60 mL/min (ref >60)
Glucose, Bld: 116 mg/dL — ABNORMAL HIGH (ref 70–99)
Potassium: 4.2 mmol/L (ref 3.5–5.1)
Sodium: 130 mmol/L — ABNORMAL LOW (ref 135–145)
Total Bilirubin: 0.5 mg/dL (ref 0.0–1.2)
Total Protein: 6.8 g/dL (ref 6.5–8.1)

## 2023-05-16 LAB — BLOOD GAS, VENOUS
Acid-Base Excess: 6 mmol/L — ABNORMAL HIGH (ref 0.0–2.0)
Bicarbonate: 32.2 mmol/L — ABNORMAL HIGH (ref 20.0–28.0)
O2 Saturation: 53.9 %
Patient temperature: 37
pCO2, Ven: 52 mmHg (ref 44–60)
pH, Ven: 7.4 (ref 7.25–7.43)
pO2, Ven: 31 mmHg — CL (ref 32–45)

## 2023-05-16 LAB — D-DIMER, QUANTITATIVE: D-Dimer, Quant: 0.44 ug{FEU}/mL (ref 0.00–0.50)

## 2023-05-16 LAB — BRAIN NATRIURETIC PEPTIDE: B Natriuretic Peptide: 41.5 pg/mL (ref 0.0–100.0)

## 2023-05-16 MED ORDER — IPRATROPIUM-ALBUTEROL 0.5-2.5 (3) MG/3ML IN SOLN
3.0000 mL | Freq: Once | RESPIRATORY_TRACT | Status: AC
  Administered 2023-05-16: 3 mL via RESPIRATORY_TRACT
  Filled 2023-05-16: qty 3

## 2023-05-16 NOTE — ED Triage Notes (Signed)
 EMS reports from home c/o SOB/ Seen earlier for same, Pt also states he fell on way to bathroom, denies headstrike, LOC blood thinners and no obvious injury.  BP 147/76 HR 92 RR 22 Sp02 93 @ 6 ltrs (on 5 normally 24-7)

## 2023-05-16 NOTE — Discharge Summary (Addendum)
 Physician Discharge Summary   Patient: Mario [name] MRN: [id number] DOB: 05-12-1950  Admit date:     05/05/2023  Discharge date: 05/16/23  Discharge Physician: Meredeth Ide   PCP: [name]   Recommendations at discharge:   Follow-up PCP in 1 week  Discharge Diagnoses: Active Problems:   Adjustment disorder with mixed disturbance of emotions and conduct   COPD with acute exacerbation (HCC)   Right lower lobe pneumonia   DM2 (diabetes mellitus, type 2) (HCC)   Essential hypertension   history of Pulmonary embolism (HCC)   BPH (benign prostatic hyperplasia)   Stage 3a chronic kidney disease (HCC)   OSA (obstructive sleep apnea)   Hypertension associated with diabetes (HCC)   Obesity, unspecified   Non-small cell lung cancer, left (HCC)   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 *  Hospital Course: 73 y.o. M with recently diagnosed lung CA, hx COPD and chronic respiratory failure on 2L home O2, who presented with hypoxia.   Had recently been admitted 2/28-3/7 for aspiration pneumonia, discharged on home O2 but couldn't make it to his car in the parking lot, became severely SOB and with tachycardia to 150, so returned to the ER.    Assessment and Plan:  Recent aspiration pneumonia Resolving acute on chronic hypoxic respiratory failure Chronic hypoxic respiratory failure due to COPD, cancer -Currently requiring 5 L/min of oxygen, which is his baseline. - Continue supplemental O2 - Continue Brovana, Incruse -Awaiting oxygen concentrator at home -Oxygen has been supplied at home.  Will discharge home today.      Chronic pain syndrome - Continue oxycodone 10 - Continue gabapentin, Cymbalta   Hypertension BP normal - Continue Coreg, diltiazem, hydralazine   Mood disorder -Continue bupropion,  BuSpar, duloxetine   CKD stage IIIa Creatinine stable relative to baseline   Morbid obesity Class III   Sleep apnea not on BiPAP   Home medication of unclear indication - Continue home Trileptal   Diet controlled diabetes Glucose normal        Consultants:  Procedures performed:  Disposition: Home Diet recommendation:  Discharge Diet Orders (From admission, onward)     Start     Ordered   05/16/23 0000  Diet - low sodium heart healthy        05/16/23 1038           Regular diet DISCHARGE MEDICATION: Allergies as of 05/16/2023       Reactions   Demerol [meperidine] Nausea And Vomiting, Other (See Comments)   Made the patient "violently sick"   Zocor [simvastatin] Nausea And Vomiting, Other (See Comments)   Made him very jittery, also   Beet [beta Vulgaris] Nausea And Vomiting   Liver Nausea And Vomiting   Any Liver        Medication List     STOP taking these medications    HYDROcodone-acetaminophen 10-325 MG tablet Commonly known as: NORCO       TAKE these medications    acetaminophen 500 MG tablet Commonly known as: TYLENOL Take 1,000 mg by mouth every 6 (six) hours as needed for mild pain (pain score 1-3), moderate pain (pain score 4-6) or headache.   albuterol (2.5 MG/3ML) 0.083% nebulizer solution Commonly known as: PROVENTIL Take 2.5 mg by nebulization every 6 (six) hours as needed for wheezing or shortness of  breath.   albuterol 108 (90 Base) MCG/ACT inhaler Commonly known as: VENTOLIN HFA Inhale 2 puffs into the lungs every 4 (four) hours as needed for wheezing or shortness of breath.   azelastine 0.1 % nasal spray Commonly known as: ASTELIN Place 1 spray into both nostrils 2 (two) times daily as needed for allergies. Use in each nostril as directed   bisacodyl 5 MG EC tablet Commonly known as: DULCOLAX Take 2 tablets (10 mg total) by mouth daily as needed for moderate constipation.   buPROPion 300 MG 24 hr tablet Commonly  known as: WELLBUTRIN XL Take 300 mg by mouth every morning.   busPIRone 15 MG tablet Commonly known as: BUSPAR Take 15 mg by mouth in the morning.   Carboxymethylcellulose Sod PF 0.5 % Soln Place 1 drop into both eyes 4 (four) times daily as needed (dry eyes).   cholecalciferol 25 MCG (1000 UNIT) tablet Commonly known as: VITAMIN D3 Take 1,000 Units by mouth daily.   cyclobenzaprine 10 MG tablet Commonly known as: FLEXERIL Take 10 mg by mouth at bedtime as needed for muscle spasms.   cycloSPORINE 0.05 % ophthalmic emulsion Commonly known as: RESTASIS Place 1 drop into both eyes every 12 (twelve) hours.   diltiazem 180 MG 24 hr capsule Commonly known as: CARDIZEM CD Take 180 mg by mouth in the morning.   docusate sodium 100 MG capsule Commonly known as: COLACE Take 200 mg by mouth 2 (two) times daily.   DULoxetine 60 MG capsule Commonly known as: CYMBALTA Take 60 mg by mouth 2 (two) times daily.   gabapentin 100 MG capsule Commonly known as: NEURONTIN Take 100 mg by mouth 3 (three) times daily.   hydrALAZINE 25 MG tablet Commonly known as: APRESOLINE Take 1 tablet (25 mg total) by mouth 2 (two) times daily.   LACTULOSE PO Take 2 Scoops by mouth 2 (two) times daily as needed. 2 tablespoonfuls   Melatonin 3 MG Caps Take 6 mg by mouth at bedtime.   omeprazole 20 MG capsule Commonly known as: PRILOSEC Take 1 capsule (20 mg total) by mouth daily. What changed:  when to take this reasons to take this   Oxcarbazepine 300 MG tablet Commonly known as: TRILEPTAL Take 600 mg by mouth in the morning, at noon, and at bedtime.   Oxycodone HCl 10 MG Tabs Take 1 tablet (10 mg total) by mouth 2 (two) times daily as needed for moderate pain (pain score 4-6) or severe pain (pain score 7-10).   OXYGEN Inhale 5 L/min into the lungs daily.   predniSONE 10 MG tablet Commonly known as: DELTASONE Take 5 tablets (50 mg total) by mouth daily with breakfast for 3 days, THEN 4  tablets (40 mg total) daily with breakfast for 3 days, THEN 3 tablets (30 mg total) daily with breakfast for 3 days, THEN 2 tablets (20 mg total) daily with breakfast for 3 days, THEN 1 tablet (10 mg total) daily with breakfast for 3 days. Start taking on: May 06, 2023   psyllium 95 % Pack Commonly known as: HYDROCIL/METAMUCIL Take 1 packet by mouth daily.   sodium chloride 0.65 % Soln nasal spray Commonly known as: OCEAN Place 1 spray into both nostrils daily at 2 am. What changed:  when to take this reasons to take this   tamsulosin 0.4 MG Caps capsule Commonly known as: FLOMAX Take 0.8 mg by mouth daily.   Tiotropium Bromide-Olodaterol 2.5-2.5 MCG/ACT Aers Inhale 2 each into the lungs every morning. 2  puffs   traZODone 50 MG tablet Commonly known as: DESYREL Take 75 mg by mouth at bedtime.               Durable Medical Equipment  (From admission, onward)           Start     Ordered   05/11/23 1323  For home use only DME oxygen  Once       Comments: 6-8 liters continuous  Question Answer Comment  Length of Need Lifetime   Mode or (Route) Nasal cannula   Liters per Minute 6   Frequency Continuous (stationary and portable oxygen unit needed)   Oxygen conserving device Yes   Oxygen delivery system Gas      05/11/23 1322            Follow-up Information     [facility].   Specialty: Home Health Services Why: Your home health services will resume with [facility]. The office will call you with resumption of care information. Contact information: [address] [place] [address] [phone]                Discharge Exam: Ceasar Mons Weights   05/05/23 2000  Weight: 135.3 kg   General-appears in no acute distress Heart-S1-S2, regular, no murmur auscultated Lungs-clear to auscultation bilaterally, no wheezing or crackles auscultated Abdomen-soft, nontender, no organomegaly Extremities-no edema in the lower  extremities Neuro-alert, oriented x3, no focal deficit noted  Condition at discharge: good  The results of significant diagnostics from this hospitalization (including imaging, microbiology, ancillary and laboratory) are listed below for reference.   Imaging Studies: DG Chest 2 View Result Date: 05/05/2023 CLINICAL DATA:  Chest pain and dyspnea EXAM: CHEST - 2 VIEW COMPARISON:  05/02/2023 chest radiograph. FINDINGS: Stable cardiomediastinal silhouette with normal heart size. No pneumothorax. Hyperinflated lungs. Small bilateral pleural effusions are unchanged. Prominent patchy opacity throughout the left lung, slightly improved. No change in streaky patchy right lung base opacity. IMPRESSION: 1. Prominent patchy opacity throughout the left lung, slightly improved, and stable streaky patchy right lung base opacity, compatible with slightly improving multilobar pneumonia. Continued chest radiograph follow-up advised to document resolution. 2. Unchanged small bilateral pleural effusions. 3. Hyperinflated lungs, suggesting COPD. Electronically Signed   By: Delbert Phenix M.D.   On: 05/05/2023 21:20   DG Chest 2 View Result Date: 05/02/2023 CLINICAL DATA:  Cough. EXAM: CHEST - 2 VIEW COMPARISON:  April 28, 2023. FINDINGS: Stable cardiomediastinal silhouette. Bibasilar opacities are again noted which may represent scarring, but acute superimposed edema or inflammation cannot be excluded. Small pleural effusions are noted. Bony thorax is unremarkable. IMPRESSION: Stable bibasilar opacities as noted above. Electronically Signed   By: Lupita Raider M.D.   On: 05/02/2023 18:43   CT Angio Chest PE W and/or Wo Contrast Result Date: 04/28/2023 CLINICAL DATA:  PE suspected. Shortness of breath with exertion. Headache. Low oxygen saturations. EXAM: CT ANGIOGRAPHY CHEST WITH CONTRAST TECHNIQUE: Multidetector CT imaging of the chest was performed using the standard protocol during bolus administration of intravenous  contrast. Multiplanar CT image reconstructions and MIPs were obtained to evaluate the vascular anatomy. RADIATION DOSE REDUCTION: This exam was performed according to the departmental dose-optimization program which includes automated exposure control, adjustment of the mA and/or kV according to patient size and/or use of iterative reconstruction technique. CONTRAST:  75mL OMNIPAQUE IOHEXOL 350 MG/ML SOLN COMPARISON:  Same day chest radiograph and CT chest 04/06/2023 FINDINGS: Cardiovascular: Negative for acute  pulmonary embolism. Coronary artery and aortic atherosclerotic calcification. No pericardial effusion. Mediastinum/Nodes: Trachea is patent. There is layering debris in the left mainstem bronchus. Unremarkable esophagus. No thoracic adenopathy. Lungs/Pleura: Centrilobular and paraseptal emphysema greatest in the upper lobes. Consolidative and ground-glass opacities in the lingula and left lower lobe are similar to prior. Increased consolidation and ground-glass opacities in the right lower lobe compared with 04/06/2023. Increased small right pleural effusion and similar small left pleural effusion. Bronchial wall thickening greatest in the lower lungs. No pneumothorax. Upper Abdomen: No acute abnormality. Musculoskeletal: No acute fracture. Chronic left lateral seventh and eighth rib fractures. Thoracic spondylosis with fusion of anterior osteophytes. Review of the MIP images confirms the above findings. IMPRESSION: 1. Negative for acute pulmonary embolism. 2. Increased consolidation and ground-glass opacities in the right lower lobe compared with 04/06/2023. Findings favor worsening pneumonia. 3. Similar consolidative and ground-glass opacities in the lingula and left lower lobe. Differential considerations include pneumonia, radiation fibrosis, or malignancy given history of lung cancer. 4. Increased small right pleural effusion and similar small left pleural effusion. 5. Debris in the left mainstem  bronchus.  Query aspiration. Electronically Signed   By: Minerva Fester M.D.   On: 04/28/2023 21:22   DG Chest Portable 1 View Result Date: 04/28/2023 CLINICAL DATA:  Shortness of breath.  Headache. EXAM: PORTABLE CHEST 1 VIEW COMPARISON:  Chest radiograph dated 04/24/2023 and CT dated 04/06/2023 FINDINGS: Background of emphysema and chronic interstitial coarsening. Bibasilar airspace densities similar to prior radiograph and CT. There is a small right pleural effusion. No pneumothorax. Stable cardiac silhouette no acute osseous pathology. IMPRESSION: No interval change since the prior radiograph. Electronically Signed   By: Elgie Collard M.D.   On: 04/28/2023 18:36   CT Head Wo Contrast Result Date: 04/27/2023 CLINICAL DATA:  Left-sided headaches for weeks, initial encounter EXAM: CT HEAD WITHOUT CONTRAST TECHNIQUE: Contiguous axial images were obtained from the base of the skull through the vertex without intravenous contrast. RADIATION DOSE REDUCTION: This exam was performed according to the departmental dose-optimization program which includes automated exposure control, adjustment of the mA and/or kV according to patient size and/or use of iterative reconstruction technique. COMPARISON:  10/12/2021 FINDINGS: Brain: No evidence of acute infarction, hemorrhage, hydrocephalus, extra-axial collection or mass lesion/mass effect. Chronic atrophic changes and white matter ischemic changes noted. Vascular: No hyperdense vessel or unexpected calcification. Skull: Normal. Negative for fracture or focal lesion. Sinuses/Orbits: No acute finding. Other: None. IMPRESSION: Chronic atrophic and ischemic changes.  No acute abnormality noted. Electronically Signed   By: Alcide Clever M.D.   On: 04/27/2023 02:57   DG Chest Portable 1 View Result Date: 04/24/2023 CLINICAL DATA:  Shortness of breath EXAM: PORTABLE CHEST 1 VIEW COMPARISON:  Chest x-ray 04/12/2023.  CT chest 04/06/2023. FINDINGS: There small bilateral  pleural effusions. There are patchy airspace opacities in the bilateral lower lungs, increasing on the right. The cardiomediastinal silhouette is stable. There is no pneumothorax or acute fracture. IMPRESSION: 1. Small bilateral pleural effusions. 2. Patchy airspace opacities in the bilateral lower lungs, increasing on the right. Electronically Signed   By: Darliss Cheney M.D.   On: 04/24/2023 21:46   DG Sinuses Complete Result Date: 04/21/2023 CLINICAL DATA:  History of epistaxis, bilateral but worse on the left. EXAM: PARANASAL SINUSES - COMPLETE 3 + VIEW four views COMPARISON:  CT head 10/12/2021. FINDINGS: There is suggestion of layering fluid within the left maxillary sinus. Additional fluid versus mucosal thickening in the inferior left frontal sinus. Visualized  paranasal sinuses are otherwise aerated. No evidence of air-fluid level on the right. Tubing overlying the left nasal cavity likely corresponding to nasal mucosal compression device. No significant bone abnormalities are seen. IMPRESSION: Air-fluid level in the left maxillary sinus, likely layering blood products. Mucosal thickening versus fluid in the inferior left frontal sinus. Electronically Signed   By: Emily Filbert M.D.   On: 04/21/2023 13:53    Microbiology: Results for orders placed or performed during the hospital encounter of 04/28/23  Resp panel by RT-PCR (RSV, Flu A&B, Covid) Anterior Nasal Swab     Status: None   Collection Time: 04/28/23  9:41 PM   Specimen: Anterior Nasal Swab  Result Value Ref Range Status   SARS Coronavirus 2 by RT PCR NEGATIVE NEGATIVE Final    Comment: (NOTE) SARS-CoV-2 target nucleic acids are NOT DETECTED.  The SARS-CoV-2 RNA is generally detectable in upper respiratory specimens during the acute phase of infection. The lowest concentration of SARS-CoV-2 viral copies this assay can detect is 138 copies/mL. A negative result does not preclude SARS-Cov-2 infection and should not be used as the  sole basis for treatment or other patient management decisions. A negative result may occur with  improper specimen collection/handling, submission of specimen other than nasopharyngeal swab, presence of viral mutation(s) within the areas targeted by this assay, and inadequate number of viral copies(<138 copies/mL). A negative result must be combined with clinical observations, patient history, and epidemiological information. The expected result is Negative.  Fact Sheet for Patients:  BloggerCourse.com  Fact Sheet for Healthcare Providers:  SeriousBroker.it  This test is no t yet approved or cleared by the [place] FDA and  has been authorized for detection and/or diagnosis of SARS-CoV-2 by FDA under an Emergency Use Authorization (EUA). This EUA will remain  in effect (meaning this test can be used) for the duration of the COVID-19 declaration under Section 564(b)(1) of the Act, 21 U.S.C.section 360bbb-3(b)(1), unless the authorization is terminated  or revoked sooner.       Influenza A by PCR NEGATIVE NEGATIVE Final   Influenza B by PCR NEGATIVE NEGATIVE Final    Comment: (NOTE) The Xpert Xpress SARS-CoV-2/FLU/RSV plus assay is intended as an aid in the diagnosis of influenza from Nasopharyngeal swab specimens and should not be used as a sole basis for treatment. Nasal washings and aspirates are unacceptable for Xpert Xpress SARS-CoV-2/FLU/RSV testing.  Fact Sheet for Patients: BloggerCourse.com  Fact Sheet for Healthcare Providers: SeriousBroker.it  This test is not yet approved or cleared by the [place] FDA and has been authorized for detection and/or diagnosis of SARS-CoV-2 by FDA under an Emergency Use Authorization (EUA). This EUA will remain in effect (meaning this test can be used) for the duration of the COVID-19 declaration under Section 564(b)(1) of the  Act, 21 U.S.C. section 360bbb-3(b)(1), unless the authorization is terminated or revoked.     Resp Syncytial Virus by PCR NEGATIVE NEGATIVE Final    Comment: (NOTE) Fact Sheet for Patients: BloggerCourse.com  Fact Sheet for Healthcare Providers: SeriousBroker.it  This test is not yet approved or cleared by the [place] FDA and has been authorized for detection and/or diagnosis of SARS-CoV-2 by FDA under an Emergency Use Authorization (EUA). This EUA will remain in effect (meaning this test can be used) for the duration of the COVID-19 declaration under Section 564(b)(1) of the Act, 21 U.S.C. section 360bbb-3(b)(1), unless the authorization is terminated or revoked.  Performed at [facility], [address]  [place] [place], [place] [address]     Labs: CBC: Recent Labs  Lab 05/14/23 0614  WBC 11.9*  HGB 12.2*  HCT 39.9  MCV 92.1  PLT 389   Basic Metabolic Panel: Recent Labs  Lab 05/12/23 0607 05/14/23 0614  NA  --  134*  K  --  4.3  CL  --  95*  CO2  --  29  GLUCOSE  --  119*  BUN  --  15  CREATININE 0.88 0.76  CALCIUM  --  8.6*   Liver Function Tests: Recent Labs  Lab 05/14/23 0614  AST 15  ALT 17  ALKPHOS 77  BILITOT 0.6  PROT 6.2*  ALBUMIN 3.3*   CBG: No results for input(s): "GLUCAP" in the last 168 hours.  Discharge time spent: greater than 30 minutes.  Signed: Meredeth Ide, MD [facility] 05/16/2023

## 2023-05-16 NOTE — Discharge Instructions (Signed)
 You were seen in the emerged part for trouble breathing after being discharged earlier today Your chest x-ray blood work EKG all looked okay You were stable on your baseline level of oxygen and have oxygen at home It is important that you follow-up with your primary care doctor in 1 week for reevaluation Return to the Emergency Department for trouble breathing chest pain or other concerns

## 2023-05-16 NOTE — ED Provider Notes (Signed)
 [place] [facility] AT [facility] Provider Note   CSN: [id number] Arrival date & time: 05/16/23  1455     History  Chief Complaint  Patient presents with   Shortness of Breath   Fall    Mario [name] is a 73 y.o. male.  With a history of COPD, lung cancer, PE not on anticoagulation and type 2 diabetes who presents to the ED for shortness of breath.  Patient was recently admitted to the hospital and discharged earlier today.  He returned home with a new oxygen concentrator and has been on 6 L at home.  Unfortunately he began to experience shortness of breath with minimal exertion including standing up to use the restroom.  He measures his oxygen saturation and dropped to the low 80s and was short of breath with oxygen in place.  He has not had any nebulizer treatments today.  No fevers chest pain nausea vomiting or diaphoresis.  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  No advancement in oncologic process based on last CT. No current chemo or radiation.   Shortness of Breath Fall Associated symptoms include shortness of breath.       Home Medications Prior to Admission medications   Medication Sig Start Date End Date Taking? Authorizing Provider  acetaminophen (TYLENOL) 500 MG tablet Take 1,000 mg by mouth every 6 (six) hours as needed for mild pain (pain score 1-3), moderate pain (pain score 4-6) or headache.    [provider]  albuterol (PROVENTIL) (2.5 MG/3ML) 0.083% nebulizer solution Take 2.5 mg by nebulization every 6 (six) hours as needed for wheezing or shortness of breath.    [provider]  albuterol (VENTOLIN HFA) 108 (90 Base) MCG/ACT inhaler Inhale 2 puffs into the lungs every 4 (four) hours as needed for wheezing or shortness of breath. 11/04/21   Glade Lloyd, MD  azelastine (ASTELIN) 0.1 % nasal spray Place 1 spray into both nostrils 2 (two) times daily as needed for allergies. Use  in each nostril as directed    [provider]  bisacodyl (DULCOLAX) 5 MG EC tablet Take 2 tablets (10 mg total) by mouth daily as needed for moderate constipation. 01/26/23   Arby Barrette, MD  buPROPion (WELLBUTRIN XL) 300 MG 24 hr tablet Take 300 mg by mouth every morning. 02/05/20   [provider]  busPIRone (BUSPAR) 15 MG tablet Take 15 mg by mouth in the morning.    [provider]  Carboxymethylcellulose Sod PF 0.5 % SOLN Place 1 drop into both eyes 4 (four) times daily as needed (dry eyes).    [provider]  cholecalciferol (VITAMIN D3) 25 MCG (1000 UNIT) tablet Take 1,000 Units by mouth daily.    [provider]  cyclobenzaprine (FLEXERIL) 10 MG tablet Take 10 mg by mouth at bedtime as needed for muscle spasms.    [provider]  cycloSPORINE (RESTASIS) 0.05 % ophthalmic emulsion Place 1 drop into both eyes every 12 (twelve) hours.    [provider]  diltiazem (CARDIZEM CD) 180 MG 24 hr capsule Take 180 mg by mouth in the morning.    [provider]  docusate sodium (COLACE) 100 MG capsule Take 200 mg by mouth 2 (two) times daily.    [provider]  DULoxetine (CYMBALTA) 60 MG capsule Take 60 mg by mouth 2 (two) times daily.    [provider]  gabapentin (NEURONTIN) 100 MG capsule Take  100 mg by mouth 3 (three) times daily.    [provider]  hydrALAZINE (APRESOLINE) 25 MG tablet Take 1 tablet (25 mg total) by mouth 2 (two) times daily. 04/24/23   Alwyn Ren, MD  LACTULOSE PO Take 2 Scoops by mouth 2 (two) times daily as needed. 2 tablespoonfuls    [provider]  Melatonin 3 MG CAPS Take 6 mg by mouth at bedtime.    [provider]  omeprazole (PRILOSEC) 20 MG capsule Take 1 capsule (20 mg total) by mouth daily. Patient taking differently: Take 20 mg by mouth daily as needed. 01/26/23   Arby Barrette, MD  Oxcarbazepine (TRILEPTAL) 300 MG tablet Take 600  mg by mouth in the morning, at noon, and at bedtime.    [provider]  Oxycodone HCl 10 MG TABS Take 1 tablet (10 mg total) by mouth 2 (two) times daily as needed for moderate pain (pain score 4-6) or severe pain (pain score 7-10). 04/24/23   Alwyn Ren, MD  OXYGEN Inhale 5 L/min into the lungs daily.    [provider]  predniSONE (DELTASONE) 10 MG tablet Take 5 tablets (50 mg total) by mouth daily with breakfast for 3 days, THEN 4 tablets (40 mg total) daily with breakfast for 3 days, THEN 3 tablets (30 mg total) daily with breakfast for 3 days, THEN 2 tablets (20 mg total) daily with breakfast for 3 days, THEN 1 tablet (10 mg total) daily with breakfast for 3 days. 05/06/23 05/21/23  Marinda Elk, MD  psyllium (HYDROCIL/METAMUCIL) 95 % PACK Take 1 packet by mouth daily. Patient not taking: Reported on 04/26/2023 04/02/23   Leatha Gilding, MD  sodium chloride (OCEAN) 0.65 % SOLN nasal spray Place 1 spray into both nostrils daily at 2 am. Patient taking differently: Place 1 spray into both nostrils daily as needed for congestion. 04/24/23   Alwyn Ren, MD  tamsulosin (FLOMAX) 0.4 MG CAPS capsule Take 0.8 mg by mouth daily.    [provider]  Tiotropium Bromide-Olodaterol 2.5-2.5 MCG/ACT AERS Inhale 2 each into the lungs every morning. 2 puffs    [provider]  traZODone (DESYREL) 50 MG tablet Take 75 mg by mouth at bedtime.    [provider]      Allergies    Demerol [meperidine], Zocor [simvastatin], Beet [beta vulgaris], and Liver    Review of Systems   Review of Systems  Respiratory:  Positive for shortness of breath.     Physical Exam Updated Vital Signs BP (!) 142/63   Pulse 84   Temp (!) 97.5 F (36.4 C) (Oral)   Resp (!) 22   SpO2 95%  Physical Exam Vitals and nursing note reviewed.  HENT:     Head: Normocephalic and atraumatic.  Eyes:     Pupils: Pupils are equal, round, and reactive to light.   Cardiovascular:     Rate and Rhythm: Normal rate and regular rhythm.  Pulmonary:     Effort: Pulmonary effort is normal.     Breath sounds: Examination of the right-upper field reveals wheezing. Examination of the left-upper field reveals wheezing. Wheezing present.  Abdominal:     Palpations: Abdomen is soft.     Tenderness: There is no abdominal tenderness.  Skin:    General: Skin is warm and dry.  Neurological:     Mental Status: He is alert.  Psychiatric:        Mood and Affect: Mood normal.  ED Results / Procedures / Treatments   Labs (all labs ordered are listed, but only abnormal results are displayed) Labs Reviewed  COMPREHENSIVE METABOLIC PANEL - Abnormal; Notable for the following components:      Result Value   Sodium 130 (*)    Chloride 95 (*)    Glucose, Bld 116 (*)    Calcium 8.6 (*)    Albumin 3.3 (*)    AST 14 (*)    All other components within normal limits  CBC WITH DIFFERENTIAL/PLATELET - Abnormal; Notable for the following components:   WBC 13.9 (*)    Hemoglobin 12.4 (*)    HCT 38.2 (*)    Neutro Abs 11.1 (*)    Abs Immature Granulocytes 0.09 (*)    All other components within normal limits  BLOOD GAS, VENOUS - Abnormal; Notable for the following components:   pO2, Ven 31 (*)    Bicarbonate 32.2 (*)    Acid-Base Excess 6.0 (*)    All other components within normal limits  RESP PANEL BY RT-PCR (RSV, FLU A&B, COVID)  RVPGX2  BRAIN NATRIURETIC PEPTIDE  D-DIMER, QUANTITATIVE  TROPONIN I (HIGH SENSITIVITY)  TROPONIN I (HIGH SENSITIVITY)    EKG EKG Interpretation Date/Time:  Tuesday May 16 2023 16:29:26 EDT Ventricular Rate:  87 PR Interval:  210 QRS Duration:  121 QT Interval:  382 QTC Calculation: 460 R Axis:   4  Text Interpretation: Sinus rhythm Nonspecific intraventricular conduction delay Confirmed by Estelle June [phone] on 05/16/2023 5:14:19 PM  Radiology DG Chest Portable 1 View Result Date: 05/16/2023 CLINICAL DATA:   COPD. EXAM: PORTABLE CHEST 1 VIEW COMPARISON:  X-ray 05/05/2023 and older. FINDINGS: Bilateral pleural effusions are seen, similar when adjusted for technique. However there is increasing opacity at the right lung base. Slightly increased as well at the left lung base. No pneumothorax. Stable cardiopericardial silhouette. Interstitial prominence. Film is under penetrated. Stable cardiac silhouette. IMPRESSION: Bilateral pleural effusions identified with some adjacent opacities. Recommend continued follow-up. Under penetrated radiograph Electronically Signed   By: Karen Kays M.D.   On: 05/16/2023 17:29    Procedures Procedures    Medications Ordered in ED Medications  ipratropium-albuterol (DUONEB) 0.5-2.5 (3) MG/3ML nebulizer solution 3 mL (3 mLs Nebulization Given 05/16/23 1540)    ED Course/ Medical Decision Making/ A&P Clinical Course as of 05/16/23 2055  Tue May 16, 2023  1713 No significant electrolyte abnormalities on CMP.  Renal function at baseline.  CBC unremarkable compared with labs from 2 days ago.  No significant elevation in troponin or D-dimer.  Low suspicion for ACS or pulmonary embolism at this time but will obtain delta troponin.  COVID influenza RSV all negative.  Venous blood gas shows CO2 within normal limits with no evidence of respiratory acidosis. [MP]  2052 Reevaluated patient.  Stable on baseline O2 6 L.  Appropriate for discharge home with close PCP follow-up. [MP]    Clinical Course User Index [MP] Royanne Foots, DO                                 Medical Decision Making 73 year old male with history as above returns after being discharged from hospital this morning.  He is back with shortness of breath.  Has a new oxygen concentrator at home.  Was short of breath and hypoxic on 6 L at home with minimal exertion.  No fevers chills or other infectious symptoms.  Some  wheezing in the upper lung fields.  Differential diagnosis includes COPD exacerbation,  pneumonia, PE, dysrhythmia and viral respiratory illness.  Will give him a DuoNeb and obtain laboratory workup EKG chest x-ray along with D-dimer to look for PE.  Will obtain CTA of the chest if D-dimer significantly elevated and continue to monitor  Amount and/or Complexity of Data Reviewed Labs: ordered. Radiology: ordered.  Risk Prescription drug management.           Final Clinical Impression(s) / ED Diagnoses Final diagnoses:  COPD exacerbation [facility])    Rx / DC Orders ED Discharge Orders     None         Royanne Foots, DO 05/16/23 2055

## 2023-05-16 NOTE — Plan of Care (Signed)
  Problem: Education: Goal: Knowledge of General Education information will improve Description: Including pain rating scale, medication(s)/side effects and non-pharmacologic comfort measures Outcome: Progressing   Problem: Clinical Measurements: Goal: Ability to maintain clinical measurements within normal limits will improve Outcome: Progressing Goal: Will remain free from infection Outcome: Progressing Goal: Diagnostic test results will improve Outcome: Progressing Goal: Cardiovascular complication will be avoided Outcome: Progressing   Problem: Safety: Goal: Ability to remain free from injury will improve Outcome: Progressing   Problem: Skin Integrity: Goal: Risk for impaired skin integrity will decrease Outcome: Progressing   Problem: Education: Goal: Knowledge of disease or condition will improve Outcome: Progressing Goal: Knowledge of the prescribed therapeutic regimen will improve Outcome: Progressing   Problem: Activity: Goal: Will verbalize the importance of balancing activity with adequate rest periods Outcome: Progressing   Problem: Respiratory: Goal: Ability to maintain a clear airway will improve Outcome: Progressing Goal: Ability to maintain adequate ventilation will improve Outcome: Progressing

## 2023-05-16 NOTE — Plan of Care (Addendum)
 Patient is alert and oriented X4, discharged home with oxygen concentrator. RN spoke to roommate Thayer Ohm and Oxygen concentrator delivered 1015. No other needs atm.  RN and NT wheeled patient down and helped him get in the car, connected home oxygen to 6L [place] and patient left in stable condition.  Problem: Education: Goal: Knowledge of General Education information will improve Description: Including pain rating scale, medication(s)/side effects and non-pharmacologic comfort measures Outcome: Completed/Met   Problem: Health Behavior/Discharge Planning: Goal: Ability to manage health-related needs will improve Outcome: Completed/Met   Problem: Clinical Measurements: Goal: Ability to maintain clinical measurements within normal limits will improve Outcome: Completed/Met Goal: Will remain free from infection Outcome: Completed/Met Goal: Diagnostic test results will improve Outcome: Completed/Met Goal: Respiratory complications will improve Outcome: Completed/Met Goal: Cardiovascular complication will be avoided Outcome: Completed/Met   Problem: Activity: Goal: Risk for activity intolerance will decrease Outcome: Completed/Met   Problem: Pain Managment: Goal: General experience of comfort will improve and/or be controlled Outcome: Completed/Met   Problem: Safety: Goal: Ability to remain free from injury will improve Outcome: Completed/Met   Problem: Skin Integrity: Goal: Risk for impaired skin integrity will decrease Outcome: Completed/Met   Problem: Education: Goal: Knowledge of disease or condition will improve Outcome: Completed/Met Goal: Knowledge of the prescribed therapeutic regimen will improve Outcome: Completed/Met   Problem: Activity: Goal: Ability to tolerate increased activity will improve Outcome: Completed/Met Goal: Will verbalize the importance of balancing activity with adequate rest periods Outcome: Completed/Met   Problem: Respiratory: Goal: Ability to  maintain a clear airway will improve Outcome: Completed/Met Goal: Levels of oxygenation will improve Outcome: Completed/Met Goal: Ability to maintain adequate ventilation will improve Outcome: Completed/Met

## 2023-05-17 ENCOUNTER — Inpatient Hospital Stay (HOSPITAL_COMMUNITY)
Admission: EM | Admit: 2023-05-17 | Discharge: 2023-05-25 | DRG: 191 | Disposition: A | Discharge status: Home Health | Attending: Internal Medicine | Admitting: Internal Medicine

## 2023-05-17 ENCOUNTER — Emergency Department (HOSPITAL_COMMUNITY)

## 2023-05-17 ENCOUNTER — Other Ambulatory Visit: Payer: Self-pay

## 2023-05-17 ENCOUNTER — Encounter (HOSPITAL_COMMUNITY): Payer: Self-pay | Admitting: Emergency Medicine

## 2023-05-17 DIAGNOSIS — Z765 Malingerer [conscious simulation]: Secondary | ICD-10-CM

## 2023-05-17 DIAGNOSIS — W19XXXA Unspecified fall, initial encounter: Secondary | ICD-10-CM | POA: Diagnosis present

## 2023-05-17 DIAGNOSIS — Z91018 Allergy to other foods: Secondary | ICD-10-CM

## 2023-05-17 DIAGNOSIS — Z923 Personal history of irradiation: Secondary | ICD-10-CM

## 2023-05-17 DIAGNOSIS — Z5320 Procedure and treatment not carried out because of patient's decision for unspecified reasons: Secondary | ICD-10-CM | POA: Diagnosis present

## 2023-05-17 DIAGNOSIS — Z888 Allergy status to other drugs, medicaments and biological substances status: Secondary | ICD-10-CM

## 2023-05-17 DIAGNOSIS — I152 Hypertension secondary to endocrine disorders: Secondary | ICD-10-CM | POA: Diagnosis present

## 2023-05-17 DIAGNOSIS — J9601 Acute respiratory failure with hypoxia: Secondary | ICD-10-CM | POA: Diagnosis present

## 2023-05-17 DIAGNOSIS — K5909 Other constipation: Secondary | ICD-10-CM | POA: Diagnosis present

## 2023-05-17 DIAGNOSIS — N1831 Chronic kidney disease, stage 3a: Secondary | ICD-10-CM | POA: Diagnosis present

## 2023-05-17 DIAGNOSIS — C799 Secondary malignant neoplasm of unspecified site: Secondary | ICD-10-CM | POA: Diagnosis present

## 2023-05-17 DIAGNOSIS — J439 Emphysema, unspecified: Secondary | ICD-10-CM | POA: Diagnosis not present

## 2023-05-17 DIAGNOSIS — E1122 Type 2 diabetes mellitus with diabetic chronic kidney disease: Secondary | ICD-10-CM | POA: Diagnosis present

## 2023-05-17 DIAGNOSIS — I951 Orthostatic hypotension: Secondary | ICD-10-CM | POA: Diagnosis present

## 2023-05-17 DIAGNOSIS — F6089 Other specific personality disorders: Secondary | ICD-10-CM | POA: Diagnosis present

## 2023-05-17 DIAGNOSIS — Z1589 Genetic susceptibility to other disease: Secondary | ICD-10-CM

## 2023-05-17 DIAGNOSIS — Z79899 Other long term (current) drug therapy: Secondary | ICD-10-CM

## 2023-05-17 DIAGNOSIS — G8929 Other chronic pain: Secondary | ICD-10-CM | POA: Diagnosis present

## 2023-05-17 DIAGNOSIS — H209 Unspecified iridocyclitis: Secondary | ICD-10-CM | POA: Diagnosis present

## 2023-05-17 DIAGNOSIS — Z9981 Dependence on supplemental oxygen: Secondary | ICD-10-CM

## 2023-05-17 DIAGNOSIS — Z87891 Personal history of nicotine dependence: Secondary | ICD-10-CM

## 2023-05-17 DIAGNOSIS — Z604 Social exclusion and rejection: Secondary | ICD-10-CM | POA: Diagnosis present

## 2023-05-17 DIAGNOSIS — Z885 Allergy status to narcotic agent status: Secondary | ICD-10-CM

## 2023-05-17 DIAGNOSIS — F419 Anxiety disorder, unspecified: Secondary | ICD-10-CM | POA: Diagnosis present

## 2023-05-17 DIAGNOSIS — Z1152 Encounter for screening for COVID-19: Secondary | ICD-10-CM

## 2023-05-17 DIAGNOSIS — I252 Old myocardial infarction: Secondary | ICD-10-CM

## 2023-05-17 DIAGNOSIS — Z515 Encounter for palliative care: Secondary | ICD-10-CM

## 2023-05-17 DIAGNOSIS — E1101 Type 2 diabetes mellitus with hyperosmolarity with coma: Secondary | ICD-10-CM

## 2023-05-17 DIAGNOSIS — Z85828 Personal history of other malignant neoplasm of skin: Secondary | ICD-10-CM

## 2023-05-17 DIAGNOSIS — D631 Anemia in chronic kidney disease: Secondary | ICD-10-CM | POA: Diagnosis present

## 2023-05-17 DIAGNOSIS — Z91199 Patient's noncompliance with other medical treatment and regimen due to unspecified reason: Secondary | ICD-10-CM

## 2023-05-17 DIAGNOSIS — Z86711 Personal history of pulmonary embolism: Secondary | ICD-10-CM | POA: Diagnosis present

## 2023-05-17 DIAGNOSIS — C3492 Malignant neoplasm of unspecified part of left bronchus or lung: Secondary | ICD-10-CM | POA: Diagnosis present

## 2023-05-17 DIAGNOSIS — Z8701 Personal history of pneumonia (recurrent): Secondary | ICD-10-CM

## 2023-05-17 DIAGNOSIS — E119 Type 2 diabetes mellitus without complications: Secondary | ICD-10-CM

## 2023-05-17 DIAGNOSIS — Y92009 Unspecified place in unspecified non-institutional (private) residence as the place of occurrence of the external cause: Secondary | ICD-10-CM

## 2023-05-17 DIAGNOSIS — Z66 Do not resuscitate: Secondary | ICD-10-CM | POA: Insufficient documentation

## 2023-05-17 DIAGNOSIS — N400 Enlarged prostate without lower urinary tract symptoms: Secondary | ICD-10-CM | POA: Diagnosis present

## 2023-05-17 DIAGNOSIS — F32A Depression, unspecified: Secondary | ICD-10-CM | POA: Diagnosis present

## 2023-05-17 DIAGNOSIS — J449 Chronic obstructive pulmonary disease, unspecified: Secondary | ICD-10-CM | POA: Diagnosis present

## 2023-05-17 DIAGNOSIS — J189 Pneumonia, unspecified organism: Principal | ICD-10-CM

## 2023-05-17 DIAGNOSIS — G4733 Obstructive sleep apnea (adult) (pediatric): Secondary | ICD-10-CM | POA: Diagnosis present

## 2023-05-17 DIAGNOSIS — E1159 Type 2 diabetes mellitus with other circulatory complications: Secondary | ICD-10-CM | POA: Diagnosis present

## 2023-05-17 DIAGNOSIS — J9611 Chronic respiratory failure with hypoxia: Secondary | ICD-10-CM | POA: Diagnosis present

## 2023-05-17 DIAGNOSIS — J9621 Acute and chronic respiratory failure with hypoxia: Secondary | ICD-10-CM | POA: Diagnosis present

## 2023-05-17 DIAGNOSIS — E669 Obesity, unspecified: Secondary | ICD-10-CM | POA: Diagnosis present

## 2023-05-17 DIAGNOSIS — Z6838 Body mass index (BMI) 38.0-38.9, adult: Secondary | ICD-10-CM

## 2023-05-17 DIAGNOSIS — Z608 Other problems related to social environment: Secondary | ICD-10-CM | POA: Diagnosis present

## 2023-05-17 LAB — TROPONIN I (HIGH SENSITIVITY)
Troponin I (High Sensitivity): 9 ng/L (ref <18)
Troponin I (High Sensitivity): 11 ng/L (ref <18)

## 2023-05-17 LAB — COMPREHENSIVE METABOLIC PANEL
ALT: 17 U/L (ref 0–44)
AST: 25 U/L (ref 15–41)
Albumin: 3.7 g/dL (ref 3.5–5.0)
Alkaline Phosphatase: 89 U/L (ref 38–126)
Anion gap: 9 (ref 5–15)
BUN: 14 mg/dL (ref 8–23)
CO2: 24 mmol/L (ref 22–32)
Calcium: 8.7 mg/dL — ABNORMAL LOW (ref 8.9–10.3)
Chloride: 101 mmol/L (ref 98–111)
Creatinine, Ser: 0.8 mg/dL (ref 0.61–1.24)
GFR, Estimated: >60 mL/min (ref >60)
Glucose, Bld: 177 mg/dL — ABNORMAL HIGH (ref 70–99)
Potassium: 4.4 mmol/L (ref 3.5–5.1)
Sodium: 134 mmol/L — ABNORMAL LOW (ref 135–145)
Total Bilirubin: 0.6 mg/dL (ref 0.0–1.2)
Total Protein: 7.6 g/dL (ref 6.5–8.1)

## 2023-05-17 LAB — CBC WITH DIFFERENTIAL/PLATELET
Abs Immature Granulocytes: 0.1 10*3/uL — ABNORMAL HIGH (ref 0.00–0.07)
Basophils Absolute: 0.1 10*3/uL (ref 0.0–0.1)
Basophils Relative: 1 %
Eosinophils Absolute: 0.1 10*3/uL (ref 0.0–0.5)
Eosinophils Relative: 1 %
HCT: 38.5 % — ABNORMAL LOW (ref 39.0–52.0)
Hemoglobin: 12.6 g/dL — ABNORMAL LOW (ref 13.0–17.0)
Immature Granulocytes: 1 %
Lymphocytes Relative: 12 %
Lymphs Abs: 1.6 10*3/uL (ref 0.7–4.0)
MCH: 28.6 pg (ref 26.0–34.0)
MCHC: 32.7 g/dL (ref 30.0–36.0)
MCV: 87.5 fL (ref 80.0–100.0)
Monocytes Absolute: 1.3 10*3/uL — ABNORMAL HIGH (ref 0.1–1.0)
Monocytes Relative: 10 %
Neutro Abs: 10 10*3/uL — ABNORMAL HIGH (ref 1.7–7.7)
Neutrophils Relative %: 75 %
Platelets: 434 10*3/uL — ABNORMAL HIGH (ref 150–400)
RBC: 4.4 MIL/uL (ref 4.22–5.81)
RDW: 12.9 % (ref 11.5–15.5)
WBC: 13.2 10*3/uL — ABNORMAL HIGH (ref 4.0–10.5)
nRBC: 0 % (ref 0.0–0.2)

## 2023-05-17 LAB — BLOOD GAS, VENOUS
Acid-Base Excess: 5 mmol/L — ABNORMAL HIGH (ref 0.0–2.0)
Bicarbonate: 29.1 mmol/L — ABNORMAL HIGH (ref 20.0–28.0)
O2 Saturation: 87.9 %
Patient temperature: 37
pCO2, Ven: 40 mmHg — ABNORMAL LOW (ref 44–60)
pH, Ven: 7.47 — ABNORMAL HIGH (ref 7.25–7.43)
pO2, Ven: 53 mmHg — ABNORMAL HIGH (ref 32–45)

## 2023-05-17 LAB — RESP PANEL BY RT-PCR (RSV, FLU A&B, COVID)  RVPGX2
Influenza A by PCR: NEGATIVE
Influenza B by PCR: NEGATIVE
Resp Syncytial Virus by PCR: NEGATIVE
SARS Coronavirus 2 by RT PCR: NEGATIVE

## 2023-05-17 LAB — BRAIN NATRIURETIC PEPTIDE: B Natriuretic Peptide: 46.5 pg/mL (ref 0.0–100.0)

## 2023-05-17 LAB — FUNGAL ORGANISM REFLEX

## 2023-05-17 LAB — FUNGUS CULTURE RESULT

## 2023-05-17 LAB — FUNGUS CULTURE WITH STAIN

## 2023-05-17 MED ORDER — IOHEXOL 350 MG/ML SOLN
75.0000 mL | Freq: Once | INTRAVENOUS | Status: AC | PRN
  Administered 2023-05-17: 75 mL via INTRAVENOUS

## 2023-05-17 MED ORDER — ONDANSETRON HCL 4 MG/2ML IJ SOLN
4.0000 mg | Freq: Once | INTRAMUSCULAR | Status: AC
  Administered 2023-05-17: 4 mg via INTRAVENOUS
  Filled 2023-05-17: qty 2

## 2023-05-17 NOTE — ED Triage Notes (Signed)
 Patient BIB [facility] c/o sob with exertion.  Seen yesterday for the same.  Patient has a home O2 monitor which increases his anxiety.  Patient also endorses constipation x 1 week.   93% 6L [place] chronic

## 2023-05-17 NOTE — ED Provider Triage Note (Signed)
 Emergency Medicine Provider Triage Evaluation Note  Mario [name] , a 73 y.o. male with history of COPD, lung cancer, was evaluated in triage.  Pt complains of shortness of breath and his oxygen saturation dropping at home when he is moving around at home.  States will drop even with just a few steps.  He also reports feelings of presyncope when it drops.  Denies any chest pain.  Also reports feeling generally more weak.  Review of Systems  Positive: As above Negative: As above  Physical Exam  BP (!) 145/85 (BP Location: Left Arm)   Pulse (!) 106   Temp 98.1 F (36.7 C) (Oral)   Resp (!) 21   Wt 136 kg   SpO2 (!) 89%   BMI 38.50 kg/m  Gen:   Awake, no distress   Resp:  Normal effort  MSK:   Moves extremities without difficulty    On baseline 6 L nasal cannula, satting 90%  Medical Decision Making  Medically screening exam initiated at 7:55 PM.  Appropriate orders placed.  Mario [name] was informed that the remainder of the evaluation will be completed by another provider, this initial triage assessment does not replace that evaluation, and the importance of remaining in the ED until their evaluation is complete.  Given tachycardia and shortness of breath, ordered PE study.  Ordered labs, workup started   Arabella Merles, [place] 05/17/23 2002

## 2023-05-17 NOTE — ED Provider Notes (Signed)
  EMERGENCY DEPARTMENT AT [facility] Provider Note   CSN: [id number] Arrival date & time: 05/17/23  1937     History {Add pertinent medical, surgical, social history, OB history to HPI:1} Chief Complaint  Patient presents with   Shortness of Breath    [name] is a 73 y.o. male.  The history is provided by the patient and medical records.  Shortness of Breath  73 year old male with history of diabetes, hypertension, chronic kidney disease, BPH, COPD on chronic O2, history of PE not currently on anticoagulation, presenting to the ED with shortness of breath.  Patient with multiple recent admission for same.  Discharged yesterday around 1030, presented back to the ED yesterday by 1600.  He had reassuring work-up and was back on his baseline O2.  He returns today due to low saturations at home even when walking just a few steps.  States walking from chair to door is about 4 steps and he drops to around 73%.  States he always drops a little but never usually into the 70s..  He does get lightheaded and feels as if he may pass out when this occurs.  He has not lost consciousness.  Patient does express that he feels like he is not really safe living on his own currently.  He does live in a house where [place] without multiple wounds and does have a pastor across the hall that checks on him but he feels like he may need more daily medical care.  Home Medications Prior to Admission medications   Medication Sig Start Date End Date Taking? Authorizing Provider  acetaminophen (TYLENOL) 500 MG tablet Take 1,000 mg by mouth every 6 (six) hours as needed for mild pain (pain score 1-3), moderate pain (pain score 4-6) or headache.    [provider]  albuterol (PROVENTIL) (2.5 MG/3ML) 0.083% nebulizer solution Take 2.5 mg by nebulization every 6 (six) hours as needed for wheezing or shortness of breath.    [provider]  albuterol (VENTOLIN HFA) 108 (90  Base) MCG/ACT inhaler Inhale 2 puffs into the lungs every 4 (four) hours as needed for wheezing or shortness of breath. 11/04/21   Glade Lloyd, MD  azelastine (ASTELIN) 0.1 % nasal spray Place 1 spray into both nostrils 2 (two) times daily as needed for allergies. Use in each nostril as directed    [provider]  bisacodyl (DULCOLAX) 5 MG EC tablet Take 2 tablets (10 mg total) by mouth daily as needed for moderate constipation. 01/26/23   Arby Barrette, MD  buPROPion (WELLBUTRIN XL) 300 MG 24 hr tablet Take 300 mg by mouth every morning. 02/05/20   [provider]  busPIRone (BUSPAR) 15 MG tablet Take 15 mg by mouth in the morning.    [provider]  Carboxymethylcellulose Sod PF 0.5 % SOLN Place 1 drop into both eyes 4 (four) times daily as needed (dry eyes).    [provider]  cholecalciferol (VITAMIN D3) 25 MCG (1000 UNIT) tablet Take 1,000 Units by mouth daily.    [provider]  cyclobenzaprine (FLEXERIL) 10 MG tablet Take 10 mg by mouth at bedtime as needed for muscle spasms.    [provider]  cycloSPORINE (RESTASIS) 0.05 % ophthalmic emulsion Place 1 drop into both eyes every 12 (twelve) hours.    [provider]  diltiazem (CARDIZEM CD) 180 MG 24 hr capsule Take 180 mg by mouth in the morning.    [provider]  docusate  sodium (COLACE) 100 MG capsule Take 200 mg by mouth 2 (two) times daily.    [provider]  DULoxetine (CYMBALTA) 60 MG capsule Take 60 mg by mouth 2 (two) times daily.    [provider]  gabapentin (NEURONTIN) 100 MG capsule Take 100 mg by mouth 3 (three) times daily.    [provider]  hydrALAZINE (APRESOLINE) 25 MG tablet Take 1 tablet (25 mg total) by mouth 2 (two) times daily. 04/24/23   Alwyn Ren, MD  LACTULOSE PO Take 2 Scoops by mouth 2 (two) times daily as needed. 2 tablespoonfuls    [provider]  Melatonin 3 MG CAPS Take 6 mg by mouth  at bedtime.    [provider]  omeprazole (PRILOSEC) 20 MG capsule Take 1 capsule (20 mg total) by mouth daily. Patient taking differently: Take 20 mg by mouth daily as needed. 01/26/23   Arby Barrette, MD  Oxcarbazepine (TRILEPTAL) 300 MG tablet Take 600 mg by mouth in the morning, at noon, and at bedtime.    [provider]  Oxycodone HCl 10 MG TABS Take 1 tablet (10 mg total) by mouth 2 (two) times daily as needed for moderate pain (pain score 4-6) or severe pain (pain score 7-10). 04/24/23   Alwyn Ren, MD  OXYGEN Inhale 5 L/min into the lungs daily.    [provider]  predniSONE (DELTASONE) 10 MG tablet Take 5 tablets (50 mg total) by mouth daily with breakfast for 3 days, THEN 4 tablets (40 mg total) daily with breakfast for 3 days, THEN 3 tablets (30 mg total) daily with breakfast for 3 days, THEN 2 tablets (20 mg total) daily with breakfast for 3 days, THEN 1 tablet (10 mg total) daily with breakfast for 3 days. 05/06/23 05/21/23  Marinda Elk, MD  psyllium (HYDROCIL/METAMUCIL) 95 % PACK Take 1 packet by mouth daily. Patient not taking: Reported on 04/26/2023 04/02/23   Leatha Gilding, MD  sodium chloride (OCEAN) 0.65 % SOLN nasal spray Place 1 spray into both nostrils daily at 2 am. Patient taking differently: Place 1 spray into both nostrils daily as needed for congestion. 04/24/23   Alwyn Ren, MD  tamsulosin (FLOMAX) 0.4 MG CAPS capsule Take 0.8 mg by mouth daily.    [provider]  Tiotropium Bromide-Olodaterol 2.5-2.5 MCG/ACT AERS Inhale 2 each into the lungs every morning. 2 puffs    [provider]  traZODone (DESYREL) 50 MG tablet Take 75 mg by mouth at bedtime.    [provider]      Allergies    Demerol [meperidine], Zocor [simvastatin], Beet [beta vulgaris], and Liver    Review of Systems   Review of Systems  Respiratory:  Positive for shortness of breath.   Neurological:  Positive for  light-headedness.  All other systems reviewed and are negative.   Physical Exam Updated Vital Signs BP 105/62 (BP Location: Left Arm)   Pulse (!) 101   Temp (!) 97.5 F (36.4 C) (Oral)   Resp 14   Wt 136 kg   SpO2 91%   BMI 38.50 kg/m   Physical Exam Vitals and nursing note reviewed.  Constitutional:      Appearance: He is well-developed.  HENT:     Head: Normocephalic and atraumatic.  Eyes:     Conjunctiva/sclera: Conjunctivae normal.     Pupils: Pupils are equal, round, and reactive to light.  Cardiovascular:     Rate and Rhythm: Normal rate  and regular rhythm.     Heart sounds: Normal heart sounds.  Pulmonary:     Effort: Pulmonary effort is normal.     Breath sounds: Normal breath sounds. No wheezing.     Comments: Lungs are grossly clear, saturating around 89 to 90% on 6 L, dropped to 75% just with conversation at rest but did rebound well Abdominal:     General: Bowel sounds are normal.     Palpations: Abdomen is soft.  Musculoskeletal:        General: Normal range of motion.     Cervical back: Normal range of motion.  Skin:    General: Skin is warm and dry.  Neurological:     Mental Status: He is alert and oriented to person, place, and time.     ED Results / Procedures / Treatments   Labs (all labs ordered are listed, but only abnormal results are displayed) Labs Reviewed  CBC WITH DIFFERENTIAL/PLATELET - Abnormal; Notable for the following components:      Result Value   WBC 13.2 (*)    Hemoglobin 12.6 (*)    HCT 38.5 (*)    Platelets 434 (*)    Neutro Abs 10.0 (*)    Monocytes Absolute 1.3 (*)    Abs Immature Granulocytes 0.10 (*)    All other components within normal limits  COMPREHENSIVE METABOLIC PANEL - Abnormal; Notable for the following components:   Sodium 134 (*)    Glucose, Bld 177 (*)    Calcium 8.7 (*)    All other components within normal limits  BLOOD GAS, VENOUS - Abnormal; Notable for the following components:   pH, Ven 7.47  (*)    pCO2, Ven 40 (*)    pO2, Ven 53 (*)    Bicarbonate 29.1 (*)    Acid-Base Excess 5.0 (*)    All other components within normal limits  RESP PANEL BY RT-PCR (RSV, FLU A&B, COVID)  RVPGX2  BRAIN NATRIURETIC PEPTIDE  TROPONIN I (HIGH SENSITIVITY)  TROPONIN I (HIGH SENSITIVITY)    EKG None  Radiology DG Chest Portable 1 View Result Date: 05/16/2023 CLINICAL DATA:  COPD. EXAM: PORTABLE CHEST 1 VIEW COMPARISON:  X-ray 05/05/2023 and older. FINDINGS: Bilateral pleural effusions are seen, similar when adjusted for technique. However there is increasing opacity at the right lung base. Slightly increased as well at the left lung base. No pneumothorax. Stable cardiopericardial silhouette. Interstitial prominence. Film is under penetrated. Stable cardiac silhouette. IMPRESSION: Bilateral pleural effusions identified with some adjacent opacities. Recommend continued follow-up. Under penetrated radiograph Electronically Signed   By: Karen Kays M.D.   On: 05/16/2023 17:29    Procedures Procedures  {Document cardiac monitor, telemetry assessment procedure when appropriate:1}  Medications Ordered in ED Medications  iohexol (OMNIPAQUE) 350 MG/ML injection 75 mL (has no administration in time range)    ED Course/ Medical Decision Making/ A&P   {   Click here for ABCD2, HEART and other calculatorsREFRESH Note before signing :1}                              Medical Decision Making Amount and/or Complexity of Data Reviewed Labs: ordered.  Risk Prescription drug management.   ***  {Document critical care time when appropriate:1} {Document review of labs and clinical decision tools ie heart score, Chads2Vasc2 etc:1}  {Document your independent review of radiology images, and any outside records:1} {Document your discussion with family members, caretakers, and  with consultants:1} {Document social determinants of health affecting pt's care:1} {Document your decision making why or why  not admission, treatments were needed:1} Final Clinical Impression(s) / ED Diagnoses Final diagnoses:  None    Rx / DC Orders ED Discharge Orders     None

## 2023-05-18 ENCOUNTER — Observation Stay (HOSPITAL_COMMUNITY)

## 2023-05-18 ENCOUNTER — Encounter (HOSPITAL_COMMUNITY): Payer: Self-pay | Admitting: Internal Medicine

## 2023-05-18 DIAGNOSIS — C3492 Malignant neoplasm of unspecified part of left bronchus or lung: Secondary | ICD-10-CM

## 2023-05-18 DIAGNOSIS — J189 Pneumonia, unspecified organism: Secondary | ICD-10-CM | POA: Diagnosis not present

## 2023-05-18 DIAGNOSIS — N400 Enlarged prostate without lower urinary tract symptoms: Secondary | ICD-10-CM | POA: Diagnosis present

## 2023-05-18 DIAGNOSIS — G8929 Other chronic pain: Secondary | ICD-10-CM | POA: Diagnosis not present

## 2023-05-18 DIAGNOSIS — W19XXXA Unspecified fall, initial encounter: Secondary | ICD-10-CM | POA: Diagnosis present

## 2023-05-18 DIAGNOSIS — F6089 Other specific personality disorders: Secondary | ICD-10-CM | POA: Diagnosis present

## 2023-05-18 DIAGNOSIS — N1831 Chronic kidney disease, stage 3a: Secondary | ICD-10-CM | POA: Diagnosis present

## 2023-05-18 DIAGNOSIS — J432 Centrilobular emphysema: Secondary | ICD-10-CM

## 2023-05-18 DIAGNOSIS — E1101 Type 2 diabetes mellitus with hyperosmolarity with coma: Secondary | ICD-10-CM

## 2023-05-18 DIAGNOSIS — Y92009 Unspecified place in unspecified non-institutional (private) residence as the place of occurrence of the external cause: Secondary | ICD-10-CM | POA: Diagnosis not present

## 2023-05-18 DIAGNOSIS — Z7189 Other specified counseling: Secondary | ICD-10-CM

## 2023-05-18 DIAGNOSIS — J9601 Acute respiratory failure with hypoxia: Secondary | ICD-10-CM | POA: Diagnosis present

## 2023-05-18 DIAGNOSIS — I129 Hypertensive chronic kidney disease with stage 1 through stage 4 chronic kidney disease, or unspecified chronic kidney disease: Secondary | ICD-10-CM | POA: Diagnosis not present

## 2023-05-18 DIAGNOSIS — R0602 Shortness of breath: Secondary | ICD-10-CM | POA: Diagnosis present

## 2023-05-18 DIAGNOSIS — Z765 Malingerer [conscious simulation]: Secondary | ICD-10-CM | POA: Diagnosis not present

## 2023-05-18 DIAGNOSIS — I951 Orthostatic hypotension: Secondary | ICD-10-CM | POA: Diagnosis present

## 2023-05-18 DIAGNOSIS — Z9981 Dependence on supplemental oxygen: Secondary | ICD-10-CM | POA: Diagnosis not present

## 2023-05-18 DIAGNOSIS — I152 Hypertension secondary to endocrine disorders: Secondary | ICD-10-CM | POA: Diagnosis present

## 2023-05-18 DIAGNOSIS — R0609 Other forms of dyspnea: Secondary | ICD-10-CM | POA: Diagnosis not present

## 2023-05-18 DIAGNOSIS — Z1152 Encounter for screening for COVID-19: Secondary | ICD-10-CM | POA: Diagnosis not present

## 2023-05-18 DIAGNOSIS — J449 Chronic obstructive pulmonary disease, unspecified: Secondary | ICD-10-CM | POA: Diagnosis not present

## 2023-05-18 DIAGNOSIS — Z7901 Long term (current) use of anticoagulants: Secondary | ICD-10-CM | POA: Diagnosis not present

## 2023-05-18 DIAGNOSIS — J9621 Acute and chronic respiratory failure with hypoxia: Secondary | ICD-10-CM | POA: Diagnosis present

## 2023-05-18 DIAGNOSIS — Z85828 Personal history of other malignant neoplasm of skin: Secondary | ICD-10-CM | POA: Diagnosis not present

## 2023-05-18 DIAGNOSIS — C799 Secondary malignant neoplasm of unspecified site: Secondary | ICD-10-CM

## 2023-05-18 DIAGNOSIS — Z66 Do not resuscitate: Secondary | ICD-10-CM | POA: Diagnosis present

## 2023-05-18 DIAGNOSIS — Z7984 Long term (current) use of oral hypoglycemic drugs: Secondary | ICD-10-CM | POA: Diagnosis not present

## 2023-05-18 DIAGNOSIS — J9611 Chronic respiratory failure with hypoxia: Secondary | ICD-10-CM | POA: Diagnosis present

## 2023-05-18 DIAGNOSIS — D631 Anemia in chronic kidney disease: Secondary | ICD-10-CM | POA: Diagnosis present

## 2023-05-18 DIAGNOSIS — Z79899 Other long term (current) drug therapy: Secondary | ICD-10-CM | POA: Diagnosis not present

## 2023-05-18 DIAGNOSIS — E1122 Type 2 diabetes mellitus with diabetic chronic kidney disease: Secondary | ICD-10-CM | POA: Diagnosis present

## 2023-05-18 DIAGNOSIS — E1159 Type 2 diabetes mellitus with other circulatory complications: Secondary | ICD-10-CM | POA: Diagnosis present

## 2023-05-18 DIAGNOSIS — G4733 Obstructive sleep apnea (adult) (pediatric): Secondary | ICD-10-CM | POA: Diagnosis present

## 2023-05-18 DIAGNOSIS — I252 Old myocardial infarction: Secondary | ICD-10-CM | POA: Diagnosis not present

## 2023-05-18 DIAGNOSIS — Z515 Encounter for palliative care: Secondary | ICD-10-CM | POA: Diagnosis not present

## 2023-05-18 DIAGNOSIS — E669 Obesity, unspecified: Secondary | ICD-10-CM | POA: Diagnosis present

## 2023-05-18 DIAGNOSIS — H209 Unspecified iridocyclitis: Secondary | ICD-10-CM | POA: Diagnosis present

## 2023-05-18 DIAGNOSIS — Z85118 Personal history of other malignant neoplasm of bronchus and lung: Secondary | ICD-10-CM | POA: Diagnosis not present

## 2023-05-18 DIAGNOSIS — F32A Depression, unspecified: Secondary | ICD-10-CM | POA: Diagnosis present

## 2023-05-18 DIAGNOSIS — J439 Emphysema, unspecified: Secondary | ICD-10-CM | POA: Diagnosis present

## 2023-05-18 DIAGNOSIS — F419 Anxiety disorder, unspecified: Secondary | ICD-10-CM | POA: Diagnosis present

## 2023-05-18 LAB — CBC
HCT: 38.7 % — ABNORMAL LOW (ref 39.0–52.0)
Hemoglobin: 12.2 g/dL — ABNORMAL LOW (ref 13.0–17.0)
MCH: 28.1 pg (ref 26.0–34.0)
MCHC: 31.5 g/dL (ref 30.0–36.0)
MCV: 89.2 fL (ref 80.0–100.0)
Platelets: 403 10*3/uL — ABNORMAL HIGH (ref 150–400)
RBC: 4.34 MIL/uL (ref 4.22–5.81)
RDW: 13 % (ref 11.5–15.5)
WBC: 13.6 10*3/uL — ABNORMAL HIGH (ref 4.0–10.5)
nRBC: 0 % (ref 0.0–0.2)

## 2023-05-18 LAB — IRON AND TIBC
Iron: 42 ug/dL — ABNORMAL LOW (ref 45–182)
Saturation Ratios: 10 % — ABNORMAL LOW (ref 17.9–39.5)
TIBC: 428 ug/dL (ref 250–450)
UIBC: 386 ug/dL

## 2023-05-18 LAB — RESPIRATORY PANEL BY PCR

## 2023-05-18 LAB — RETICULOCYTES
Immature Retic Fract: 27.1 % — ABNORMAL HIGH (ref 2.3–15.9)
RBC.: 4.45 MIL/uL (ref 4.22–5.81)
Retic Count, Absolute: 88.6 10*3/uL (ref 19.0–186.0)
Retic Ct Pct: 2 % (ref 0.4–3.1)

## 2023-05-18 LAB — CREATININE, SERUM
Creatinine, Ser: 1.07 mg/dL (ref 0.61–1.24)
GFR, Estimated: >60 mL/min (ref >60)

## 2023-05-18 LAB — BASIC METABOLIC PANEL
Anion gap: 11 (ref 5–15)
BUN: 19 mg/dL (ref 8–23)
CO2: 25 mmol/L (ref 22–32)
Calcium: 8.7 mg/dL — ABNORMAL LOW (ref 8.9–10.3)
Chloride: 97 mmol/L — ABNORMAL LOW (ref 98–111)
Creatinine, Ser: 1.18 mg/dL (ref 0.61–1.24)
GFR, Estimated: >60 mL/min (ref >60)
Glucose, Bld: 166 mg/dL — ABNORMAL HIGH (ref 70–99)
Potassium: 4.1 mmol/L (ref 3.5–5.1)
Sodium: 133 mmol/L — ABNORMAL LOW (ref 135–145)

## 2023-05-18 LAB — CBG MONITORING, ED
Glucose-Capillary: 133 mg/dL — ABNORMAL HIGH (ref 70–99)
Glucose-Capillary: 127 mg/dL — ABNORMAL HIGH (ref 70–99)
Glucose-Capillary: 107 mg/dL — ABNORMAL HIGH (ref 70–99)

## 2023-05-18 LAB — FERRITIN: Ferritin: 81 ng/mL (ref 24–336)

## 2023-05-18 LAB — GLUCOSE, CAPILLARY
Glucose-Capillary: 118 mg/dL — ABNORMAL HIGH (ref 70–99)
Glucose-Capillary: 158 mg/dL — ABNORMAL HIGH (ref 70–99)

## 2023-05-18 LAB — TSH: TSH: 3.932 u[IU]/mL (ref 0.350–4.500)

## 2023-05-18 LAB — STREP PNEUMONIAE URINARY ANTIGEN: Strep Pneumo Urinary Antigen: NEGATIVE

## 2023-05-18 LAB — VITAMIN B12: Vitamin B-12: 538 pg/mL (ref 180–914)

## 2023-05-18 LAB — FOLATE: Folate: 19.9 ng/mL (ref >5.9)

## 2023-05-18 MED ORDER — ARFORMOTEROL TARTRATE 15 MCG/2ML IN NEBU
15.0000 ug | INHALATION_SOLUTION | Freq: Two times a day (BID) | RESPIRATORY_TRACT | Status: DC
  Administered 2023-05-18 – 2023-05-24 (×15): 15 ug via RESPIRATORY_TRACT
  Filled 2023-05-18 (×16): qty 2

## 2023-05-18 MED ORDER — ENOXAPARIN SODIUM 80 MG/0.8ML IJ SOSY
65.0000 mg | PREFILLED_SYRINGE | INTRAMUSCULAR | Status: DC
  Administered 2023-05-18 – 2023-05-22 (×5): 65 mg via SUBCUTANEOUS
  Filled 2023-05-18: qty 0.8
  Filled 2023-05-18: qty 0.65
  Filled 2023-05-18 (×3): qty 0.8

## 2023-05-18 MED ORDER — OXCARBAZEPINE 300 MG PO TABS
600.0000 mg | ORAL_TABLET | Freq: Three times a day (TID) | ORAL | Status: DC
  Administered 2023-05-18 – 2023-05-25 (×22): 600 mg via ORAL
  Filled 2023-05-18 (×24): qty 2

## 2023-05-18 MED ORDER — BISACODYL 5 MG PO TBEC
10.0000 mg | DELAYED_RELEASE_TABLET | Freq: Every day | ORAL | Status: DC | PRN
  Administered 2023-05-18 – 2023-05-19 (×2): 10 mg via ORAL
  Filled 2023-05-18 (×2): qty 2

## 2023-05-18 MED ORDER — SODIUM CHLORIDE 0.9 % IV SOLN
100.0000 mg | Freq: Two times a day (BID) | INTRAVENOUS | Status: DC
  Administered 2023-05-18: 100 mg via INTRAVENOUS
  Filled 2023-05-18 (×2): qty 100

## 2023-05-18 MED ORDER — MELATONIN 3 MG PO TABS
6.0000 mg | ORAL_TABLET | Freq: Every day | ORAL | Status: DC
  Administered 2023-05-18 – 2023-05-24 (×7): 6 mg via ORAL
  Filled 2023-05-18 (×7): qty 2

## 2023-05-18 MED ORDER — ENOXAPARIN SODIUM 40 MG/0.4ML IJ SOSY: 40.0000 mg | PREFILLED_SYRINGE | INTRAMUSCULAR | Status: DC

## 2023-05-18 MED ORDER — DULOXETINE HCL 30 MG PO CPEP
60.0000 mg | ORAL_CAPSULE | Freq: Two times a day (BID) | ORAL | Status: DC
  Administered 2023-05-18 – 2023-05-25 (×15): 60 mg via ORAL
  Filled 2023-05-18 (×15): qty 2

## 2023-05-18 MED ORDER — MORPHINE SULFATE 10 MG/5ML PO SOLN
2.5000 mg | ORAL | Status: DC | PRN
  Administered 2023-05-18 – 2023-05-19 (×2): 2.5 mg via ORAL
  Filled 2023-05-18 (×2): qty 5

## 2023-05-18 MED ORDER — TRAZODONE HCL 50 MG PO TABS
75.0000 mg | ORAL_TABLET | Freq: Every day | ORAL | Status: DC
  Administered 2023-05-18 – 2023-05-24 (×7): 75 mg via ORAL
  Filled 2023-05-18 (×7): qty 2

## 2023-05-18 MED ORDER — DOCUSATE SODIUM 100 MG PO CAPS
200.0000 mg | ORAL_CAPSULE | Freq: Two times a day (BID) | ORAL | Status: DC
  Administered 2023-05-18 – 2023-05-25 (×14): 200 mg via ORAL
  Filled 2023-05-18 (×15): qty 2

## 2023-05-18 MED ORDER — ALBUTEROL SULFATE (2.5 MG/3ML) 0.083% IN NEBU: 2.5000 mg | INHALATION_SOLUTION | RESPIRATORY_TRACT | Status: DC | PRN

## 2023-05-18 MED ORDER — VANCOMYCIN HCL 1500 MG/300ML IV SOLN
1500.0000 mg | Freq: Two times a day (BID) | INTRAVENOUS | Status: DC
  Filled 2023-05-18: qty 300

## 2023-05-18 MED ORDER — INSULIN ASPART 100 UNIT/ML IJ SOLN
0.0000 [IU] | Freq: Three times a day (TID) | INTRAMUSCULAR | Status: DC
  Administered 2023-05-18: 1 [IU] via SUBCUTANEOUS
  Administered 2023-05-20: 2 [IU] via SUBCUTANEOUS
  Administered 2023-05-21 – 2023-05-22 (×2): 1 [IU] via SUBCUTANEOUS
  Administered 2023-05-22: 2 [IU] via SUBCUTANEOUS
  Administered 2023-05-22 – 2023-05-24 (×2): 1 [IU] via SUBCUTANEOUS
  Filled 2023-05-18: qty 0.09

## 2023-05-18 MED ORDER — UMECLIDINIUM BROMIDE 62.5 MCG/ACT IN AEPB
1.0000 | INHALATION_SPRAY | Freq: Every day | RESPIRATORY_TRACT | Status: DC
  Filled 2023-05-18: qty 7

## 2023-05-18 MED ORDER — CYCLOBENZAPRINE HCL 10 MG PO TABS
20.0000 mg | ORAL_TABLET | Freq: Every day | ORAL | Status: DC
  Administered 2023-05-18 – 2023-05-24 (×7): 20 mg via ORAL
  Filled 2023-05-18 (×7): qty 2

## 2023-05-18 MED ORDER — TAMSULOSIN HCL 0.4 MG PO CAPS
0.8000 mg | ORAL_CAPSULE | Freq: Every day | ORAL | Status: DC
  Administered 2023-05-18 – 2023-05-25 (×8): 0.8 mg via ORAL
  Filled 2023-05-18 (×8): qty 2

## 2023-05-18 MED ORDER — ACETAMINOPHEN 325 MG PO TABS
650.0000 mg | ORAL_TABLET | Freq: Three times a day (TID) | ORAL | Status: DC
  Administered 2023-05-18 – 2023-05-25 (×21): 650 mg via ORAL
  Filled 2023-05-18 (×21): qty 2

## 2023-05-18 MED ORDER — SODIUM CHLORIDE 0.9 % IV SOLN
2.0000 g | Freq: Three times a day (TID) | INTRAVENOUS | Status: DC
  Administered 2023-05-18: 2 g via INTRAVENOUS
  Filled 2023-05-18: qty 12.5

## 2023-05-18 MED ORDER — VANCOMYCIN HCL 2000 MG/400ML IV SOLN
2000.0000 mg | Freq: Once | INTRAVENOUS | Status: AC
  Administered 2023-05-18: 2000 mg via INTRAVENOUS
  Filled 2023-05-18: qty 400

## 2023-05-18 MED ORDER — SODIUM CHLORIDE 0.9 % IV SOLN
2.0000 g | Freq: Once | INTRAVENOUS | Status: AC
  Administered 2023-05-18: 2 g via INTRAVENOUS
  Filled 2023-05-18: qty 12.5

## 2023-05-18 MED ORDER — GABAPENTIN 100 MG PO CAPS
100.0000 mg | ORAL_CAPSULE | Freq: Three times a day (TID) | ORAL | Status: DC
  Administered 2023-05-18 – 2023-05-25 (×22): 100 mg via ORAL
  Filled 2023-05-18 (×22): qty 1

## 2023-05-18 MED ORDER — SALINE SPRAY 0.65 % NA SOLN
1.0000 | NASAL | Status: DC | PRN
  Administered 2023-05-19 – 2023-05-25 (×3): 1 via NASAL
  Filled 2023-05-18 (×2): qty 44

## 2023-05-18 MED ORDER — BUPROPION HCL ER (XL) 300 MG PO TB24
300.0000 mg | ORAL_TABLET | Freq: Every morning | ORAL | Status: DC
Start: 2023-05-18 — End: 2023-05-25
  Administered 2023-05-18 – 2023-05-25 (×8): 300 mg via ORAL
  Filled 2023-05-18: qty 1
  Filled 2023-05-18: qty 2
  Filled 2023-05-18 (×6): qty 1

## 2023-05-18 MED ORDER — BUSPIRONE HCL 5 MG PO TABS
15.0000 mg | ORAL_TABLET | Freq: Every morning | ORAL | Status: DC
  Administered 2023-05-18 – 2023-05-25 (×8): 15 mg via ORAL
  Filled 2023-05-18 (×2): qty 1
  Filled 2023-05-18: qty 2
  Filled 2023-05-18 (×6): qty 1

## 2023-05-18 NOTE — Progress Notes (Signed)
   05/18/23 1942  BiPAP/CPAP/SIPAP  BiPAP/CPAP/SIPAP Pt Type Adult  Reason BIPAP/CPAP not in use Non-compliant  BiPAP/CPAP /SiPAP Vitals  SpO2 99 %  Bilateral Breath Sounds Clear;Diminished

## 2023-05-18 NOTE — Progress Notes (Addendum)
 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  Patient was on a DOAC at home which was stopped due to hemoptysis and epistaxis.  He has stage IV lung cancer followed by [facility] oncology.  He had a bronch on 04/17/2023 showed focal area of granulation tissue that was biopsied which came back positive for adenocarcinoma.   He came to the hospital with complaints of anxiety hypoxia dyspnea on exertion with any minimal activity he is even afraid afraid to walk take a shower or make coffee as it drops his saturation drastically and most of the time he is alone at home though he has a roommate. [facility]  If that is an option. Admission CT of the neck CT head and chest reviewed rest of the labs reviewed Reviewed prior admissions  Patient received Rocephin and doxycycline in January 17 through January 23 Unasyn January 26 through January 30 Doxycycline February 10 through February 16 Vanco and Zosyn February 13 through February 19 Rocephin and azithromycin February 28 through March 5 Pro cal <0.10  Will dc all current antibiotics Mild leukocytosis on admit was on steroids at home Seen by PCCM last admission after the bronc they [facility] had [facility] more to offer.  Patient is aware of this.  I have seen him in the ER today he is awake alert oriented in no acute distress.  He was able to have a conversation with me and was open to palliative care discussions again he is not sure if he would be ready for hospice yet but he would like to speak with palliative care.

## 2023-05-18 NOTE — Progress Notes (Signed)
 Pharmacy Antibiotic Note  Mario [name] is a 73 y.o. male admitted on 05/17/2023 with history of diabetes, hypertension, chronic kidney disease, BPH, COPD on chronic O2, history of PE not currently on anticoagulation, presenting to the ED with shortness of breath. .  Pharmacy has been consulted to dose vancomycin and cefepime for pna.  Plan: Vancomycin 2gm IV x 1 then 1500mg  q12h (AUC 512.2, Scr 0.8) Cefepime 2gm IV q8h Follow renal function, cultures and clinical course  Weight: 136 kg (299 lb 13.2 oz)  Temp (24hrs), Avg:97.8 F (36.6 C), Min:97.5 F (36.4 C), Max:98.1 F (36.7 C)  Recent Labs  Lab 05/12/23 0607 05/14/23 0614 05/16/23 1529 05/17/23 2012  WBC  --  11.9* 13.9* 13.2*  CREATININE 0.88 0.76 0.75 0.80    Estimated Creatinine Clearance: 124.2 mL/min (by C-G formula based on SCr of 0.8 mg/dL).    Allergies  Allergen Reactions   Demerol [Meperidine] Nausea And Vomiting and Other (See Comments)    Made the patient "violently sick"   Zocor [Simvastatin] Nausea And Vomiting and Other (See Comments)    Made him very jittery, also   Beet [Beta Vulgaris] Nausea And Vomiting   Liver Nausea And Vomiting    Any Liver    Antimicrobials this admission: 3/19 vanc >> 3/19 cefepime >>  Dose adjustments this admission:   Microbiology results:  Thank you for allowing pharmacy to be a part of this patient's care.  Arley Phenix RPh 05/18/2023, 1:36 AM

## 2023-05-18 NOTE — Plan of Care (Signed)
   Problem: Education: Goal: Ability to describe self-care measures that may prevent or decrease complications (Diabetes Survival Skills Education) will improve Outcome: Progressing   Problem: Coping: Goal: Ability to adjust to condition or change in health will improve Outcome: Progressing   Problem: Tissue Perfusion: Goal: Adequacy of tissue perfusion will improve Outcome: Progressing

## 2023-05-18 NOTE — H&P (Addendum)
 History and Physical    Mario [name]:[id number] DOB: May 10, 1950 DOA: 05/17/2023  Chief Complaint: Shortness of breath.  HPI: Mario [name] is a 73 y.o. male with history of COPD, sleep apnea, hypertension, diabetes mellitus type 2, mood disorder, history of lung cancer, history of uveitis and HLA-B27 positive used to be on immunosuppressants was discharged two days ago after being admitted for respiratory failure presents with worsening shortness of breath.  Patient also states he has been having productive cough denies any fever chills.  He also states that he has been having increasing lower neck pain after his leg gave way when he was trying to walk.  Did not hit his head.  Has had multiple recent admission for respiratory failure.  ED Course: Patient is chronically hypoxic on 6 L of oxygen CT angiogram shows multifocal pneumonia with COPD changes.  BNP was 46 troponin negative EKG shows sinus tachycardia    hemoglobin 12.6 WBC 13.2 patient was placed on antibiotics admitted for observation.  Review of Systems: As per HPI, rest all negative.   Past Medical History:  Diagnosis Date   Ankylosing spondylitis lumbar region [facility]) 09/25/2022   Anxiety    Bronchitis    COPD (chronic obstructive pulmonary disease) (HCC)    Depression    History of radiation therapy    Left lung- 10/05/20-10/15/20- Dr. Antony Blackbird   Hypertension    lung ca 09/2020   MI (myocardial infarction) [facility])    ????   On home oxygen therapy    4L/min [place]   OSA (obstructive sleep apnea)    Suicide attempt (HCC)    Tension pneumothorax 06/27/2016   Uveitis     Past Surgical History:  Procedure Laterality Date   BIOPSY  07/03/2021   Procedure: BIOPSY;  Surgeon: Kathi Der, MD;  Location: [facility] ENDOSCOPY;  Service: Gastroenterology;;   BRONCHIAL BIOPSY  07/30/2020   Procedure: BRONCHIAL BIOPSIES;  Surgeon: Josephine Igo, DO;  Location: [facility] ENDOSCOPY;  Service: Pulmonary;;   BRONCHIAL BRUSHINGS   07/30/2020   Procedure: BRONCHIAL BRUSHINGS;  Surgeon: Josephine Igo, DO;  Location: [facility] ENDOSCOPY;  Service: Pulmonary;;   BRONCHIAL NEEDLE ASPIRATION BIOPSY  07/30/2020   Procedure: BRONCHIAL NEEDLE ASPIRATION BIOPSIES;  Surgeon: Josephine Igo, DO;  Location: [facility] ENDOSCOPY;  Service: Pulmonary;;   BRONCHIAL WASHINGS  07/30/2020   Procedure: BRONCHIAL WASHINGS;  Surgeon: Josephine Igo, DO;  Location: [facility] ENDOSCOPY;  Service: Pulmonary;;   BRONCHIAL WASHINGS  04/17/2023   Procedure: BRONCHIAL WASHINGS;  Surgeon: Lorin Glass, MD;  Location: [facility] ENDOSCOPY;  Service: Pulmonary;;   CHEST TUBE INSERTION Left 06/27/2016   CRYOTHERAPY  04/17/2023   Procedure: CRYOTHERAPY;  Surgeon: Lorin Glass, MD;  Location: [facility] ENDOSCOPY;  Service: Pulmonary;;   cryptorchidism     ESOPHAGOGASTRODUODENOSCOPY N/A 07/03/2021   Procedure: ESOPHAGOGASTRODUODENOSCOPY (EGD);  Surgeon: Kathi Der, MD;  Location: [facility] ENDOSCOPY;  Service: Gastroenterology;  Laterality: N/A;   HEMOSTASIS CONTROL  04/17/2023   Procedure: HEMOSTASIS CONTROL;  Surgeon: Lorin Glass, MD;  Location: [facility] ENDOSCOPY;  Service: Pulmonary;;   IR PERC PLEURAL DRAIN W/INDWELL CATH W/IMG GUIDE  08/04/2021   IR REMOVAL OF PLURAL CATH W/CUFF  09/01/2021   SKIN CANCER EXCISION     VIDEO BRONCHOSCOPY Left 04/17/2023   Procedure: VIDEO BRONCHOSCOPY WITHOUT FLUORO;  Surgeon: Lorin Glass, MD;  Location: [facility] ENDOSCOPY;  Service: Pulmonary;  Laterality: Left;   VIDEO BRONCHOSCOPY WITH ENDOBRONCHIAL NAVIGATION Left 07/30/2020   Procedure: VIDEO BRONCHOSCOPY WITH ENDOBRONCHIAL  NAVIGATION;  Surgeon: Josephine Igo, DO;  Location: [facility] ENDOSCOPY;  Service: Pulmonary;  Laterality: Left;     reports that he quit smoking about 6 years ago. His smoking use included cigarettes. He started smoking about 41 years ago. He has a 35 pack-year smoking history. He has been exposed to tobacco smoke. He has never used smokeless tobacco. He reports that he does not drink  alcohol and does not use drugs.  Allergies  Allergen Reactions   Demerol [Meperidine] Nausea And Vomiting and Other (See Comments)    Made the patient "violently sick"   Zocor [Simvastatin] Nausea And Vomiting and Other (See Comments)    Made him very jittery, also   Beet [Beta Vulgaris] Nausea And Vomiting   Liver Nausea And Vomiting    Any Liver    Family History  Problem Relation Age of Onset   Dementia Father     Prior to Admission medications   Medication Sig Start Date End Date Taking? Authorizing Provider  acetaminophen (TYLENOL) 500 MG tablet Take 1,000 mg by mouth every 6 (six) hours as needed for mild pain (pain score 1-3), moderate pain (pain score 4-6) or headache.   Yes [provider]  albuterol (PROVENTIL) (2.5 MG/3ML) 0.083% nebulizer solution Take 2.5 mg by nebulization every 6 (six) hours as needed for wheezing or shortness of breath.   Yes [provider]  albuterol (VENTOLIN HFA) 108 (90 Base) MCG/ACT inhaler Inhale 2 puffs into the lungs every 4 (four) hours as needed for wheezing or shortness of breath. 11/04/21  Yes Glade Lloyd, MD  azelastine (ASTELIN) 0.1 % nasal spray Place 1 spray into both nostrils 2 (two) times daily as needed for allergies. Use in each nostril as directed   Yes [provider]  bisacodyl (DULCOLAX) 5 MG EC tablet Take 2 tablets (10 mg total) by mouth daily as needed for moderate constipation. 01/26/23  Yes Arby Barrette, MD  buPROPion (WELLBUTRIN XL) 300 MG 24 hr tablet Take 300 mg by mouth every morning. 02/05/20  Yes [provider]  busPIRone (BUSPAR) 15 MG tablet Take 15 mg by mouth in the morning.   Yes [provider]  Carboxymethylcellulose Sod PF 0.5 % SOLN Place 1 drop into both eyes 4 (four) times daily as needed (dry eyes).   Yes [provider]  cholecalciferol (VITAMIN D3) 25 MCG (1000 UNIT) tablet Take 1,000 Units by mouth daily.   Yes [provider]   cyclobenzaprine (FLEXERIL) 10 MG tablet Take 20 mg by mouth at bedtime.   Yes [provider]  cycloSPORINE (RESTASIS) 0.05 % ophthalmic emulsion Place 1 drop into both eyes every 12 (twelve) hours.   Yes [provider]  diltiazem (CARDIZEM CD) 180 MG 24 hr capsule Take 180 mg by mouth in the morning.   Yes [provider]  docusate sodium (COLACE) 100 MG capsule Take 200 mg by mouth 2 (two) times daily.   Yes [provider]  DULoxetine (CYMBALTA) 60 MG capsule Take 60 mg by mouth 2 (two) times daily.   Yes [provider]  gabapentin (NEURONTIN) 100 MG capsule Take 100 mg by mouth 3 (three) times daily.   Yes [provider]  hydrALAZINE (APRESOLINE) 25 MG tablet Take 1 tablet (25 mg total) by mouth 2 (two) times daily. 04/24/23  Yes Alwyn Ren, MD  LACTULOSE PO Take 2 Scoops by mouth 2 (two) times daily as needed. 2 tablespoonfuls   Yes [provider]  Melatonin 3 MG CAPS Take 6 mg by mouth at bedtime.   Yes [provider]  omeprazole (PRILOSEC) 20 MG capsule Take 1 capsule (20 mg total) by mouth daily. Patient taking differently: Take 20 mg by mouth daily as needed. 01/26/23  Yes Arby Barrette, MD  Oxcarbazepine (TRILEPTAL) 300 MG tablet Take 600 mg by mouth in the morning, at noon, and at bedtime.   Yes [provider]  Oxycodone HCl 10 MG TABS Take 1 tablet (10 mg total) by mouth 2 (two) times daily as needed for moderate pain (pain score 4-6) or severe pain (pain score 7-10). 04/24/23  Yes Alwyn Ren, MD  OXYGEN Inhale 6 L/min into the lungs daily.   Yes [provider]  sodium chloride (OCEAN) 0.65 % SOLN nasal spray Place 1 spray into both nostrils daily at 2 am. Patient taking differently: Place 1 spray into both nostrils daily as needed for congestion. 04/24/23  Yes Alwyn Ren, MD  tamsulosin (FLOMAX) 0.4 MG CAPS capsule Take 0.8 mg by mouth daily.   Yes [provider]  Tiotropium Bromide-Olodaterol 2.5-2.5 MCG/ACT AERS Inhale 2 each into the lungs every morning. 2 puffs   Yes [provider]  traZODone (DESYREL) 50 MG tablet Take 75 mg by mouth at bedtime.   Yes [provider]  predniSONE (DELTASONE) 10 MG tablet Take 5 tablets (50 mg total) by mouth daily with breakfast for 3 days, THEN 4 tablets (40 mg total) daily with breakfast for 3 days, THEN 3 tablets (30 mg total) daily with breakfast for 3 days, THEN 2 tablets (20 mg total) daily with breakfast for 3 days, THEN 1 tablet (10 mg total) daily with breakfast for 3 days. Patient not taking: Reported on 05/18/2023 05/06/23 05/21/23  Marinda Elk, MD  psyllium (HYDROCIL/METAMUCIL) 95 % PACK Take 1 packet by mouth daily. Patient not taking: Reported on 04/26/2023 04/02/23   Leatha Gilding, MD    Physical Exam: Constitutional: Moderately built and nourished. Vitals:   05/17/23 1944 05/17/23 1949 05/17/23 2150 05/18/23 0142  BP:  (!) 145/85 105/62   Pulse:  (!) 106 (!) 101   Resp:  (!) 21 14   Temp:  98.1 F (36.7 C) (!) 97.5 F (36.4 C) 98.7 F (37.1 C)  TempSrc:  Oral Oral Oral  SpO2:  (!) 89% 91%   Weight: 136 kg      Eyes: Anicteric no pallor. ENMT: No discharge from the ears eyes nose or mouth. Neck: No mass felt.  No neck rigidity. Respiratory: No rhonchi or crepitations. Cardiovascular: S1-S2 heard. Abdomen: Soft nontender bowel sound present. Musculoskeletal: No edema. Skin: No rash. Neurologic: Alert awake oriented to time place and person.  Moves all extremities. Psychiatric: Appears normal.  Normal affect.   Labs on Admission: I have personally reviewed following labs and imaging studies  CBC: Recent Labs  Lab 05/14/23 0614 05/16/23 1529 05/17/23 2012 05/18/23 0137  WBC 11.9* 13.9* 13.2* 13.6*  NEUTROABS  --  11.1* 10.0*  --   HGB 12.2* 12.4* 12.6* 12.2*  HCT 39.9 38.2* 38.5* 38.7*  MCV 92.1 87.6 87.5 89.2  PLT 389 392 434* 403*    Basic Metabolic Panel: Recent Labs  Lab 05/12/23 0607 05/14/23 0614 05/16/23 1529 05/17/23 2012 05/18/23 0137  NA  --  134* 130* 134*  --   K  --  4.3 4.2 4.4  --   CL  --  95* 95* 101  --   CO2  --  29 27 24   --   GLUCOSE  --  119* 116* 177*  --   BUN  --  15 12 14   --   CREATININE 0.88 0.76 0.75 0.80 1.07  CALCIUM  --  8.6* 8.6* 8.7*  --    GFR: Estimated Creatinine Clearance: 92.9 mL/min (by C-G formula based on SCr of 1.07 mg/dL). Liver Function Tests: Recent Labs  Lab 05/14/23 0614 05/16/23 1529 05/17/23 2012  AST 15 14* 25  ALT 17 17 17   ALKPHOS 77 85 89  BILITOT 0.6 0.5 0.6  PROT 6.2* 6.8 7.6  ALBUMIN 3.3* 3.3* 3.7   No results for input(s): "LIPASE", "AMYLASE" in the last 168 hours. No results for input(s): "AMMONIA" in the last 168 hours. Coagulation Profile: No results for input(s): "INR", "PROTIME" in the last 168 hours. Cardiac Enzymes: No results for input(s): "CKTOTAL", "CKMB", "CKMBINDEX", "TROPONINI" in the last 168 hours. BNP (last 3 results) No results for input(s): "PROBNP" in the last 8760 hours. HbA1C: No results for input(s): "HGBA1C" in the last 72 hours. CBG: No results for input(s): "GLUCAP" in the last 168 hours. Lipid Profile: No results for input(s): "CHOL", "HDL", "LDLCALC", "TRIG", "CHOLHDL", "LDLDIRECT" in the last 72 hours. Thyroid Function Tests: No results for input(s): "TSH", "T4TOTAL", "FREET4", "T3FREE", "THYROIDAB" in the last 72 hours. Anemia Panel: No results for input(s): "VITAMINB12", "FOLATE", "FERRITIN", "TIBC", "IRON", "RETICCTPCT" in the last 72 hours. Urine analysis:    Component Value Date/Time   COLORURINE YELLOW 09/25/2022 1030   APPEARANCEUR CLEAR 09/25/2022 1030   LABSPEC 1.023 09/25/2022 1030   PHURINE 6.0 09/25/2022 1030   GLUCOSEU NEGATIVE 09/25/2022 1030   HGBUR NEGATIVE 09/25/2022 1030   BILIRUBINUR NEGATIVE 09/25/2022 1030   KETONESUR NEGATIVE 09/25/2022 1030   PROTEINUR NEGATIVE 09/25/2022 1030    UROBILINOGEN 1.0 01/12/2013 0024   NITRITE NEGATIVE 09/25/2022 1030   LEUKOCYTESUR NEGATIVE 09/25/2022 1030   Sepsis Labs: @LABRCNTIP (procalcitonin:4,lacticidven:4) ) Recent Results (from the past 240 hours)  Resp panel by RT-PCR (RSV, Flu A&B, Covid) Anterior Nasal Swab     Status: None   Collection Time: 05/16/23  3:31 PM   Specimen: Anterior Nasal Swab  Result Value Ref Range Status   SARS Coronavirus 2 by RT PCR NEGATIVE NEGATIVE Final    Comment: (NOTE) SARS-CoV-2 target nucleic acids are NOT DETECTED.  The SARS-CoV-2 RNA is generally detectable in upper respiratory specimens during the acute phase of infection. The lowest concentration of SARS-CoV-2 viral copies this assay can detect is 138 copies/mL. A negative result does not preclude SARS-Cov-2 infection and should not be used as the sole basis for treatment or other patient management decisions. A negative result may occur with  improper specimen collection/handling, submission of specimen other than nasopharyngeal swab, presence of viral mutation(s) within the areas targeted by this assay, and inadequate number of viral copies(<138 copies/mL). A negative result must be combined with clinical observations, patient history, and epidemiological information. The expected result is Negative.  Fact Sheet for Patients:  BloggerCourse.com  Fact Sheet for Healthcare Providers:  SeriousBroker.it  This test is no t yet approved or cleared by the [place] FDA and  has been authorized for detection and/or diagnosis of SARS-CoV-2 by FDA under an Emergency Use Authorization (EUA). This EUA will remain  in effect (meaning this test can be used) for the duration of the COVID-19 declaration under Section 564(b)(1) of the Act, 21 U.S.C.section 360bbb-3(b)(1), unless the authorization is terminated  or revoked sooner.  Influenza A by PCR NEGATIVE NEGATIVE Final    Influenza B by PCR NEGATIVE NEGATIVE Final    Comment: (NOTE) The Xpert Xpress SARS-CoV-2/FLU/RSV plus assay is intended as an aid in the diagnosis of influenza from Nasopharyngeal swab specimens and should not be used as a sole basis for treatment. Nasal washings and aspirates are unacceptable for Xpert Xpress SARS-CoV-2/FLU/RSV testing.  Fact Sheet for Patients: BloggerCourse.com  Fact Sheet for Healthcare Providers: SeriousBroker.it  This test is not yet approved or cleared by the [place] FDA and has been authorized for detection and/or diagnosis of SARS-CoV-2 by FDA under an Emergency Use Authorization (EUA). This EUA will remain in effect (meaning this test can be used) for the duration of the COVID-19 declaration under Section 564(b)(1) of the Act, 21 U.S.C. section 360bbb-3(b)(1), unless the authorization is terminated or revoked.     Resp Syncytial Virus by PCR NEGATIVE NEGATIVE Final    Comment: (NOTE) Fact Sheet for Patients: BloggerCourse.com  Fact Sheet for Healthcare Providers: SeriousBroker.it  This test is not yet approved or cleared by the [place] FDA and has been authorized for detection and/or diagnosis of SARS-CoV-2 by FDA under an Emergency Use Authorization (EUA). This EUA will remain in effect (meaning this test can be used) for the duration of the COVID-19 declaration under Section 564(b)(1) of the Act, 21 U.S.C. section 360bbb-3(b)(1), unless the authorization is terminated or revoked.  Performed at [facility], [address], [place], [place] [address]   Resp panel by RT-PCR (RSV, Flu A&B, Covid) Anterior Nasal Swab     Status: None   Collection Time: 05/17/23  8:12 PM   Specimen: Anterior Nasal Swab  Result Value Ref Range Status   SARS Coronavirus 2 by RT PCR NEGATIVE NEGATIVE Final    Comment: (NOTE) SARS-CoV-2  target nucleic acids are NOT DETECTED.  The SARS-CoV-2 RNA is generally detectable in upper respiratory specimens during the acute phase of infection. The lowest concentration of SARS-CoV-2 viral copies this assay can detect is 138 copies/mL. A negative result does not preclude SARS-Cov-2 infection and should not be used as the sole basis for treatment or other patient management decisions. A negative result may occur with  improper specimen collection/handling, submission of specimen other than nasopharyngeal swab, presence of viral mutation(s) within the areas targeted by this assay, and inadequate number of viral copies(<138 copies/mL). A negative result must be combined with clinical observations, patient history, and epidemiological information. The expected result is Negative.  Fact Sheet for Patients:  BloggerCourse.com  Fact Sheet for Healthcare Providers:  SeriousBroker.it  This test is no t yet approved or cleared by the [place] FDA and  has been authorized for detection and/or diagnosis of SARS-CoV-2 by FDA under an Emergency Use Authorization (EUA). This EUA will remain  in effect (meaning this test can be used) for the duration of the COVID-19 declaration under Section 564(b)(1) of the Act, 21 U.S.C.section 360bbb-3(b)(1), unless the authorization is terminated  or revoked sooner.       Influenza A by PCR NEGATIVE NEGATIVE Final   Influenza B by PCR NEGATIVE NEGATIVE Final    Comment: (NOTE) The Xpert Xpress SARS-CoV-2/FLU/RSV plus assay is intended as an aid in the diagnosis of influenza from Nasopharyngeal swab specimens and should not be used as a sole basis for treatment. Nasal washings and aspirates are unacceptable for Xpert Xpress SARS-CoV-2/FLU/RSV testing.  Fact Sheet for Patients: BloggerCourse.com  Fact Sheet for Healthcare  Providers: SeriousBroker.it  This  test is not yet approved or cleared by the [place] and has been authorized for detection and/or diagnosis of SARS-CoV-2 by FDA under an Emergency Use Authorization (EUA). This EUA will remain in effect (meaning this test can be used) for the duration of the COVID-19 declaration under Section 564(b)(1) of the Act, 21 U.S.C. section 360bbb-3(b)(1), unless the authorization is terminated or revoked.     Resp Syncytial Virus by PCR NEGATIVE NEGATIVE Final    Comment: (NOTE) Fact Sheet for Patients: BloggerCourse.com  Fact Sheet for Healthcare Providers: SeriousBroker.it  This test is not yet approved or cleared by the [place] FDA and has been authorized for detection and/or diagnosis of SARS-CoV-2 by FDA under an Emergency Use Authorization (EUA). This EUA will remain in effect (meaning this test can be used) for the duration of the COVID-19 declaration under Section 564(b)(1) of the Act, 21 U.S.C. section 360bbb-3(b)(1), unless the authorization is terminated or revoked.  Performed at [facility], [address] [place], [place] [address]      Radiological Exams on Admission: CT Angio Chest PE W and/or Wo Contrast Result Date: 05/17/2023 CLINICAL DATA:  Shortness of breath. EXAM: CT ANGIOGRAPHY CHEST WITH CONTRAST TECHNIQUE: Multidetector CT imaging of the chest was performed using the standard protocol during bolus administration of intravenous contrast. Multiplanar CT image reconstructions and MIPs were obtained to evaluate the vascular anatomy. RADIATION DOSE REDUCTION: This exam was performed according to the departmental dose-optimization program which includes automated exposure control, adjustment of the mA and/or kV according to patient size and/or use of iterative reconstruction technique. CONTRAST:  75mL OMNIPAQUE IOHEXOL 350 MG/ML  SOLN COMPARISON:  April 28, 2023 FINDINGS: Cardiovascular: There is mild calcification of the thoracic aorta, without evidence of aortic aneurysm. Satisfactory opacification of the pulmonary arteries to the segmental level. No evidence of pulmonary embolism. Normal heart size with moderate to marked severity coronary artery calcification. No pericardial effusion. Mediastinum/Nodes: Subcentimeter AP window and pretracheal lymph nodes are seen. Thyroid gland, trachea, and esophagus demonstrate no significant findings. Lungs/Pleura: There is evidence of extensive paraseptal and central lobular emphysematous lung disease. Predominantly stable, marked severity lingular and bilateral lower lobe consolidation is noted. Areas of ground-glass appearing lung parenchyma are also seen within these regions. There is a small, stable right pleural effusion. No pneumothorax is identified. Upper Abdomen: There are stable bilateral simple renal cysts. Musculoskeletal: Multilevel degenerative changes are noted throughout the thoracic spine. Review of the MIP images confirms the above findings. IMPRESSION: 1. No evidence of pulmonary embolism. 2. 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 3. Predominantly stable, marked severity lingular and bilateral lower lobe consolidation, consistent with multifocal pneumonia. 4. Small, stable right pleural effusion. 5. Stable bilateral simple renal cysts. No follow-up imaging is recommended. This recommendation follows ACR consensus guidelines: Management of the Incidental Renal Mass on CT: A White Paper of the ACR Incidental Findings Committee. J Am Coll Radiol 2018;15:264-273. 6. Aortic atherosclerosis. Aortic Atherosclerosis (ICD10-I70.0) and Emphysema (ICD10-J43.9). Electronically Signed   By: Aram Candela M.D.   On: 05/17/2023 23:53   DG Chest Portable 1 View Result Date: 05/16/2023 CLINICAL DATA:  COPD. EXAM: PORTABLE CHEST 1 VIEW COMPARISON:  X-ray  05/05/2023 and older. FINDINGS: Bilateral pleural effusions are seen, similar when adjusted for technique. However there is increasing opacity at the right lung base. Slightly increased as well at the left lung base. No pneumothorax. Stable cardiopericardial silhouette. Interstitial prominence. Film is under penetrated. Stable cardiac silhouette. IMPRESSION: Bilateral  pleural effusions identified with some adjacent opacities. Recommend continued follow-up. Under penetrated radiograph Electronically Signed   By: Karen Kays M.D.   On: 05/16/2023 17:29    EKG: Independently reviewed.  Sinus tachycardia.  Assessment/Plan Principal Problem:   Multifocal pneumonia Active Problems:   DM2 (diabetes mellitus, type 2) (HCC)   COPD (chronic obstructive pulmonary disease) (HCC)   Non-small cell cancer of 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   Chronic pain   Stage 3a chronic kidney disease (HCC)   OSA (obstructive sleep apnea)   Hypertension associated with diabetes (HCC)   Acute respiratory failure with hypoxia (HCC)    Multifocal pneumonia -     on 6 L oxygen which is at baseline.  Will keep patient on vancomycin cefepime and doxycycline.  Follow cultures.  Respiratory viral panel. Chronic respiratory failure on 6 L oxygen secondary to COPD on inhalers and nebulizer as needed.  On exam patient is normally wheezing. Hypertension blood pressure is in the low normal.  Holding off patient's beta-blockers hydralazine and Cardizem for now.  Closely observe. Diabetes mellitus type 2 diet controlled on sliding scale coverage last hemoglobin A1c was 6.7. Chronic anemia check anemia panel follow CBC. Sleep apnea on BiPAP at bedtime. Lung cancer status post SBRT need to follow-up with oncologist. History of uveitis and HLA-B27 positive used to be on immunosuppressant previously. Chronic pain on Trileptal and gabapentin.  Complaining of increasing neck pain CT head and C-spine pending. Mood disorder on  Cymbalta BuSpar and Wellbutrin.   BPH on tamsulosin.  Since patient has multifocal pneumonia will need close monitoring and more than 2 midnight stay.   DVT prophylaxis: Lovenox. Code Status: Full code. Family Communication: Discussed with patient. Disposition Plan: Medical floor. Consults called: None. Admission status: Observation.

## 2023-05-18 NOTE — Consult Note (Signed)
                                                                                   Consultation Note Date: 05/18/2023   Patient Name: Mario [name]  DOB: 29-Dec-1950  MRN: [id number]  Age / Sex: 73 y.o., male  PCP: [name] Referring Physician: Alwyn Ren, MD  Reason for Consultation:   HPI/Patient Profile: 73 y.o. male  with past medical history of *** admitted on 05/17/2023 with ***.   Primary Decision Maker {Primary Decision NWGNF:62130}  Discussion: ***    SUMMARY OF RECOMMENDATIONS -Code status changed to DNR- limited- Do not intubate -Full scope otherwise- pt not wanting hospice- wants to pursue workup for possible cancer treatment -Recommend social work for assistance with placement- pt states he cannot return to where he lives  -Morphine 2.5 mg po for SOB -Tylenol 650 mg po TID for generalized arthritis pain in various joints  Code Status/Advance Care Planning:   Code Status: Limited: Do not attempt resuscitation (DNR) -DNR-LIMITED -Do Not Intubate/DNI     Prognosis:   {Palliative Care Prognosis:23504}  Discharge Planning: {Palliative dispostion:23505}  Primary Diagnoses: Present on Admission:  Acute respiratory failure with hypoxia (HCC)  Hypertension associated with diabetes (HCC)  Stage 3a chronic kidney disease (HCC)  OSA (obstructive sleep apnea)  COPD (chronic obstructive pulmonary disease) (HCC)  Non-small cell cancer of 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  Chronic pain  Uveitis of both eyes  Metastatic carcinoma (HCC)   Review of Systems  Physical Exam  Vital Signs: BP (!) 147/87   Pulse 89   Temp 98.1 F (36.7 C) (Oral)   Resp 20   Wt 136 kg   SpO2 93%   BMI 38.50 kg/m  Pain Scale: 0-10   Pain Score: 0-No pain   SpO2: SpO2: 93 % O2 Device:SpO2: 93 % O2 Flow Rate: .O2 Flow Rate (L/min): 6 L/min  IO: Intake/output summary:  Intake/Output Summary (Last 24 hours) at 05/18/2023 1610 Last data filed at  05/18/2023 0451 Gross per 24 hour  Intake 750 ml  Output --  Net 750 ml    LBM:   Baseline Weight: Weight: 136 kg Most recent weight: Weight: 136 kg       Thank you for this consult. Palliative medicine will continue to follow and assist as needed.  Time Total: *** Signed by: Ocie Bob, AGNP-C Palliative Medicine  Time includes:   Preparing to see the patient (e.g., review of tests) Obtaining and/or reviewing separately obtained history Performing a medically necessary appropriate examination and/or evaluation Counseling and educating the patient/family/caregiver Ordering medications, tests, or procedures Referring and communicating with other health care professionals (when not reported separately) Documenting clinical information in the electronic or other health record Independently interpreting results (not reported separately) and communicating results to the patient/family/caregiver Care coordination (not reported separately) Clinical documentation   Please contact Palliative Medicine Team phone at [phone] for questions and concerns.  For individual provider: See Loretha Stapler

## 2023-05-19 DIAGNOSIS — C799 Secondary malignant neoplasm of unspecified site: Secondary | ICD-10-CM | POA: Diagnosis not present

## 2023-05-19 DIAGNOSIS — J189 Pneumonia, unspecified organism: Secondary | ICD-10-CM | POA: Diagnosis not present

## 2023-05-19 DIAGNOSIS — J9621 Acute and chronic respiratory failure with hypoxia: Secondary | ICD-10-CM | POA: Diagnosis not present

## 2023-05-19 DIAGNOSIS — Z7189 Other specified counseling: Secondary | ICD-10-CM | POA: Diagnosis not present

## 2023-05-19 LAB — CBC
HCT: 36.8 % — ABNORMAL LOW (ref 39.0–52.0)
Hemoglobin: 11.7 g/dL — ABNORMAL LOW (ref 13.0–17.0)
MCH: 28.5 pg (ref 26.0–34.0)
MCHC: 31.8 g/dL (ref 30.0–36.0)
MCV: 89.8 fL (ref 80.0–100.0)
Platelets: 359 10*3/uL (ref 150–400)
RBC: 4.1 MIL/uL — ABNORMAL LOW (ref 4.22–5.81)
RDW: 13.2 % (ref 11.5–15.5)
WBC: 10 10*3/uL (ref 4.0–10.5)
nRBC: 0 % (ref 0.0–0.2)

## 2023-05-19 LAB — GLUCOSE, CAPILLARY
Glucose-Capillary: 112 mg/dL — ABNORMAL HIGH (ref 70–99)
Glucose-Capillary: 91 mg/dL (ref 70–99)
Glucose-Capillary: 108 mg/dL — ABNORMAL HIGH (ref 70–99)
Glucose-Capillary: 109 mg/dL — ABNORMAL HIGH (ref 70–99)

## 2023-05-19 MED ORDER — SENNA 8.6 MG PO TABS
2.0000 | ORAL_TABLET | Freq: Two times a day (BID) | ORAL | Status: DC
  Administered 2023-05-19 – 2023-05-25 (×12): 17.2 mg via ORAL
  Filled 2023-05-19 (×13): qty 2

## 2023-05-19 MED ORDER — POLYETHYLENE GLYCOL 3350 17 G PO PACK
17.0000 g | PACK | Freq: Every day | ORAL | Status: DC
  Administered 2023-05-19 – 2023-05-24 (×4): 17 g via ORAL
  Filled 2023-05-19 (×6): qty 1

## 2023-05-19 MED ORDER — NYSTATIN 100000 UNIT/GM EX POWD
Freq: Three times a day (TID) | CUTANEOUS | Status: DC
  Filled 2023-05-19: qty 15

## 2023-05-19 MED ORDER — LACTULOSE 10 GM/15ML PO SOLN
30.0000 g | Freq: Once | ORAL | Status: AC
  Administered 2023-05-19: 30 g via ORAL
  Filled 2023-05-19: qty 45

## 2023-05-19 MED ORDER — MORPHINE SULFATE 10 MG/5ML PO SOLN
5.0000 mg | ORAL | Status: DC | PRN
  Administered 2023-05-19: 5 mg via ORAL
  Filled 2023-05-19: qty 5

## 2023-05-19 MED ORDER — ONDANSETRON 4 MG PO TBDP
4.0000 mg | ORAL_TABLET | Freq: Three times a day (TID) | ORAL | Status: DC | PRN
  Administered 2023-05-19 – 2023-05-24 (×6): 4 mg via ORAL
  Filled 2023-05-19 (×6): qty 1

## 2023-05-19 NOTE — Evaluation (Signed)
 Physical Therapy Evaluation Patient Details Name: Mario [name] MRN: [id number] DOB: 1951-01-13 Today's Date: 05/19/2023  History of Present Illness  Pt is a 73 y.o. male presenting to therapy following hospital admission on 05/17/2023 with increased shortness of breath with productive cough, LBP and B LE weakness. Pt has had multiple recent admissions due to acute restpiraotry failure. Pt is chronically hypoxic and on 6 L/min supplemental O2, imaging reveals PNA with COPD changes. Pt PMH Includes but is not limited to: non-small cell lung cancer status post SBRT, vasculitis and ankylosing spondylitis, PE on Eliquis, OSA with CPAP intolerance, type 2 diabetes mellitus, chronic pain, depression, anxiety, and COPD with chronic hypoxic respiratory failure.  Clinical Impression  Pt admitted with above diagnosis.  Pt currently with functional limitations due to the deficits listed below (see PT Problem List). Pt in bed when PT arrived. Pt agreeable to therapy intervention. Pt reports ongoing SOB and on 6 L/min. Pt is mod I for bed mobility, S for transfer tasks no AD with min cues, gait tasks no AD with close S and min cues, no overt LOB with pt desaturating to 88% with bed to commode ~12 feet when returning to EOB pt able to maintain >/=90% on 6 L/min. Pt left seated EOB, all needs in place and nurse present.  Patient will benefit from continued inpatient follow up therapy, <3 hours/day.  Pt will benefit from acute skilled PT to increase their independence and safety with mobility to allow discharge.         If plan is discharge home, recommend the following: Help with stairs or ramp for entrance;Assistance with cooking/housework;Assist for transportation   Can travel by private vehicle        Equipment Recommendations None recommended by PT  Recommendations for Other Services       Functional Status Assessment Patient has had a recent decline in their functional status and demonstrates the  ability to make significant improvements in function in a reasonable and predictable amount of time.     Precautions / Restrictions Precautions Precautions: Fall Recall of Precautions/Restrictions: Intact Precaution/Restrictions Comments: Monitor O2, 5L O2 baseline Restrictions Weight Bearing Restrictions Per Provider Order: No      Mobility  Bed Mobility Overal bed mobility: Modified Independent                  Transfers Overall transfer level: Independent Equipment used: None Transfers: Sit to/from Stand Sit to Stand: Supervision           General transfer comment: supervision for safety with min cues for bed and commode transfers, pt is able to manage [place] tube    Ambulation/Gait Ambulation/Gait assistance: Supervision Gait Distance (Feet): 12 Feet Assistive device: None Gait Pattern/deviations: Decreased stride length, Step-to pattern, Trunk flexed Gait velocity: functional     General Gait Details: slight trunk flexion, step almost through pattern, SOB with short amb bouts to and from bathroom ~ 12 feet on 6 L/min with pt desaturating with frist amb bout to 88% and able to maintain >/=90% on 6 L/min with return amb bout to EOB. pt is aware of SOB and self monitors O2 saturation with personal pulseoximiter and exhibits good recall for breathing techniques, pt remains limited with functional activity tolerance  Stairs            Wheelchair Mobility     Tilt Bed    Modified Rankin (Stroke Patients Only)       Balance Overall balance assessment: No apparent balance  deficits (not formally assessed) Sitting-balance support: No upper extremity supported, Feet supported Sitting balance-Leahy Scale: Good     Standing balance support: No upper extremity supported Standing balance-Leahy Scale: Good                               Pertinent Vitals/Pain Pain Assessment Pain Assessment: 0-10 Pain Score: 7  Pain Location: R shoulder Pain  Descriptors / Indicators: Discomfort, Grimacing, Guarding Pain Intervention(s): Limited activity within patient's tolerance, Monitored during session    Home Living Family/patient expects to be discharged to:: Private residence Living Arrangements: Non-relatives/Friends Available Help at Discharge: Friend(s);Other (Comment) (senior care aide) Type of Home: House Home Access: Level entry     Alternate Level Stairs-Number of Steps: Split Level : enter on main level with kitchen/living  up 5 steps to bedroom and bathroom Home Layout: Two level Home Equipment: Cane - single point;Shower Counsellor (2 wheels);Rollator (4 wheels);Other (comment);Hand held shower head;Adaptive equipment Additional Comments: Wears 5 L O2 at baseline, has many pulse oximeters    Prior Function Prior Level of Function : Needs assist             Mobility Comments: Ambulates in home; gets SOB easily, seated rest breaks to recover; walks without AD ADLs Comments: Independent with ADLs; has assist with IADLs; senior helper 3 days/week assists with microwave meals, light cleaning, companionship     Extremity/Trunk Assessment   Upper Extremity Assessment Upper Extremity Assessment: Defer to OT evaluation    Lower Extremity Assessment Lower Extremity Assessment: Overall WFL for tasks assessed    Cervical / Trunk Assessment Cervical / Trunk Assessment: Normal  Communication   Communication Communication: No apparent difficulties    Cognition Arousal: Alert Behavior During Therapy: WFL for tasks assessed/performed   PT - Cognitive impairments: No apparent impairments                       PT - Cognition Comments: AxO x 3 pleasant.  Lives with a Roommate Following commands: Intact       Cueing       General Comments General comments (skin integrity, edema, etc.): pt on 6 L/min throughout PT eval today and noted SOB    Exercises     Assessment/Plan    PT Assessment  Patient needs continued PT services  PT Problem List Cardiopulmonary status limiting activity;Decreased activity tolerance;Decreased mobility;Pain       PT Treatment Interventions DME instruction;Gait training;Stair training;Functional mobility training;Therapeutic activities;Therapeutic exercise;Patient/family education;Balance training;Neuromuscular re-education    PT Goals (Current goals can be found in the Care Plan section)  Acute Rehab PT Goals Patient Stated Goal: "not be short of breath" and get rehab so I can go home PT Goal Formulation: With patient Time For Goal Achievement: 06/09/23 Potential to Achieve Goals: Good    Frequency Min 2X/week     Co-evaluation               AM-PAC PT "6 Clicks" Mobility  Outcome Measure Help needed turning from your back to your side while in a flat bed without using bedrails?: None Help needed moving from lying on your back to sitting on the side of a flat bed without using bedrails?: None Help needed moving to and from a bed to a chair (including a wheelchair)?: None Help needed standing up from a chair using your arms (e.g., wheelchair or bedside chair)?: None Help needed to walk  in hospital room?: A Little Help needed climbing 3-5 steps with a railing? : A Little 6 Click Score: 22    End of Session Equipment Utilized During Treatment: Oxygen Activity Tolerance: Patient limited by fatigue;Other (comment) (SOB) Patient left: in bed;with call bell/phone within reach;with nursing/sitter in room Nurse Communication: Mobility status PT Visit Diagnosis: Difficulty in walking, not elsewhere classified (R26.2);Unsteadiness on feet (R26.81);Pain Pain - Right/Left: Right Pain - part of body: Shoulder    Time: 1003-1017 PT Time Calculation (min) (ACUTE ONLY): 14 min   Charges:   PT Evaluation $PT Eval Low Complexity: 1 Low   PT General Charges $$ ACUTE PT VISIT: 1 Visit         Johnny Bridge, PT Acute Rehab   Jacqualyn Posey 05/19/2023, 10:26 AM

## 2023-05-19 NOTE — Progress Notes (Signed)
 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  Patient was on a DOAC at home which was stopped due to hemoptysis and epistaxis.  He has stage IV lung cancer followed by [facility] oncology.  He had a bronch on 04/17/2023 showed focal area of granulation tissue that was biopsied which came back positive for adenocarcinoma.   He came to the hospital with complaints of anxiety hypoxia dyspnea on exertion with any minimal activity he is even afraid afraid to walk take a shower or make coffee as it drops his saturation drastically and most of the time he is alone at home though he has a roommate. will [facility].  If that is an option. Admission CT of the neck CT head and chest reviewed rest of the labs reviewed  Assessment & Plan:   Principal Problem:   Multifocal pneumonia Active Problems:   DM2 (diabetes mellitus, type 2) (HCC)   COPD (chronic obstructive pulmonary disease) (HCC)   Non-small cell cancer of 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   Chronic pain   Stage 3a chronic kidney disease (HCC)   OSA (obstructive sleep apnea)   Hypertension associated with diabetes (HCC)   HLA B27 (HLA B27 positive)   Uveitis of both eyes   Metastatic carcinoma (HCC)   Acute respiratory failure with hypoxia (HCC)  #1 end-stage COPD on 6 L of oxygen/lungs CA recurrence adenocarcinoma antibiotics stopped as patient has received multiple courses of antibiotics in the last 3 months.  Respiratory virus panel is negative.  Procalcitonin has been less than 0.10.  Seen by palliative.  Though [facility] he refuses hospice.  He does not want to go home as he  feels he cannot take care of himself.  His oxygen drops with any activities.  His roommate is not able to care for him.  He has had 5 admissions this year in the last 2-1/2 months.  He was suicidal a month ago.  He wants someone to take care of him.  At the same time he does not want to sign up for hospice.  PCCM saw him last admission and [facility] as there was nothing they could offer him here.  He has finally agreed to DNR/DNI. Palliative have started him on morphine for dyspnea continue.  #2 constipation MiraLAX Colace and senna  #3 hypertension on Cardizem and hydralazine at home this has been held on admission due to soft BP will restart as needed.  #4 type 2 diabetes last A1c 6.7 he is requesting a regular diet continue carb modified.  #5 sleep apnea on BiPAP  #6 anemia of chronic disease stable hemoglobin  #7 chronic pain continue gabapentin and Trileptal  #8 mood disorder on Cymbalta Wellbutrin and BuSpar  #9 BPH on tamsulosin  #10 history of pulmonary embolism he was on Eliquis during his admission in February 2025 this was stopped during that time due to ongoing hemoptysis followed by he developed epistaxis ENT had to be consulted and nasal pack placed.  Eliquis was stopped indefinitely at that point.   Estimated body mass index is 38.5 kg/m as calculated from the  following:   Height as of 05/05/23: 6\' 2"  (1.88 m).   Weight as of this encounter: 136 kg.  DVT prophylaxis: Lovenox  code Status: DNR  family Communication: None Disposition Plan:  Status is: Medically stable for discharge    Consultants:  None  Procedures: None Antimicrobials: None  Subjective: Sitting up by the side of the bed complaining of constipation does not appear to be in any distress  Objective: Vitals:   05/18/23 2121 05/19/23 0119 05/19/23 0521 05/19/23 0823  BP: 120/78 (!) 112/57 125/62   Pulse: 98 81 85   Resp: 18 19 18    Temp: 97.7 F (36.5 C) 98.3 F  (36.8 C) 98.1 F (36.7 C)   TempSrc:      SpO2: 95% 97% 98% 93%  Weight:        Intake/Output Summary (Last 24 hours) at 05/19/2023 1231 Last data filed at 05/19/2023 1200 Gross per 24 hour  Intake 60 ml  Output 575 ml  Net -515 ml   Filed Weights   05/17/23 1944  Weight: 136 kg    Examination:  General exam: Appears in no acute distress Respiratory system: Clear to auscultation. Respiratory effort normal. Cardiovascular system: S1 & S2 heard, RRR. No JVD, murmurs, rubs, gallops or clicks. No pedal edema. Gastrointestinal system: Abdomen is nondistended, soft and nontender. No organomegaly or masses felt. Normal bowel sounds heard. Central nervous system: Alert and oriented. No focal neurological deficits. Extremities: Symmetric 5 x 5 power  Data Reviewed: I have personally reviewed following labs and imaging studies  CBC: Recent Labs  Lab 05/14/23 0614 05/16/23 1529 05/17/23 2012 05/18/23 0137 05/19/23 0520  WBC 11.9* 13.9* 13.2* 13.6* 10.0  NEUTROABS  --  11.1* 10.0*  --   --   HGB 12.2* 12.4* 12.6* 12.2* 11.7*  HCT 39.9 38.2* 38.5* 38.7* 36.8*  MCV 92.1 87.6 87.5 89.2 89.8  PLT 389 392 434* 403* 359   Basic Metabolic Panel: Recent Labs  Lab 05/14/23 0614 05/16/23 1529 05/17/23 2012 05/18/23 0137 05/18/23 0328  NA 134* 130* 134*  --  133*  K 4.3 4.2 4.4  --  4.1  CL 95* 95* 101  --  97*  CO2 29 27 24   --  25  GLUCOSE 119* 116* 177*  --  166*  BUN 15 12 14   --  19  CREATININE 0.76 0.75 0.80 1.07 1.18  CALCIUM 8.6* 8.6* 8.7*  --  8.7*   GFR: Estimated Creatinine Clearance: 84.2 mL/min (by C-G formula based on SCr of 1.18 mg/dL). Liver Function Tests: Recent Labs  Lab 05/14/23 0614 05/16/23 1529 05/17/23 2012  AST 15 14* 25  ALT 17 17 17   ALKPHOS 77 85 89  BILITOT 0.6 0.5 0.6  PROT 6.2* 6.8 7.6  ALBUMIN 3.3* 3.3* 3.7   No results for input(s): "LIPASE", "AMYLASE" in the last 168 hours. No results for input(s): "AMMONIA" in the last 168  hours. Coagulation Profile: No results for input(s): "INR", "PROTIME" in the last 168 hours. Cardiac Enzymes: No results for input(s): "CKTOTAL", "CKMB", "CKMBINDEX", "TROPONINI" in the last 168 hours. BNP (last 3 results) No results for input(s): "PROBNP" in the last 8760 hours. HbA1C: No results for input(s): "HGBA1C" in the last 72 hours. CBG: Recent Labs  Lab 05/18/23 1322 05/18/23 1754 05/18/23 2105 05/19/23 0723 05/19/23 1145  GLUCAP 107* 118* 158* 112* 91   Lipid Profile: No results for input(s): "CHOL", "HDL", "LDLCALC", "TRIG", "CHOLHDL", "LDLDIRECT" in the last 72 hours. Thyroid Function Tests:  Recent Labs    05/18/23 0137  TSH 3.932   Anemia Panel: Recent Labs    05/18/23 0328  VITAMINB12 538  FOLATE 19.9  FERRITIN 81  TIBC 428  IRON 42*  RETICCTPCT 2.0   Sepsis Labs: No results for input(s): "PROCALCITON", "LATICACIDVEN" in the last 168 hours.  Recent Results (from the past 240 hours)  Resp panel by RT-PCR (RSV, Flu A&B, Covid) Anterior Nasal Swab     Status: None   Collection Time: 05/16/23  3:31 PM   Specimen: Anterior Nasal Swab  Result Value Ref Range Status   SARS Coronavirus 2 by RT PCR NEGATIVE NEGATIVE Final    Comment: (NOTE) SARS-CoV-2 target nucleic acids are NOT DETECTED.  The SARS-CoV-2 RNA is generally detectable in upper respiratory specimens during the acute phase of infection. The lowest concentration of SARS-CoV-2 viral copies this assay can detect is 138 copies/mL. A negative result does not preclude SARS-Cov-2 infection and should not be used as the sole basis for treatment or other patient management decisions. A negative result may occur with  improper specimen collection/handling, submission of specimen other than nasopharyngeal swab, presence of viral mutation(s) within the areas targeted by this assay, and inadequate number of viral copies(<138 copies/mL). A negative result must be combined with clinical observations,  patient history, and epidemiological information. The expected result is Negative.  Fact Sheet for Patients:  BloggerCourse.com  Fact Sheet for Healthcare Providers:  SeriousBroker.it  This test is no t yet approved or cleared by the [place] FDA and  has been authorized for detection and/or diagnosis of SARS-CoV-2 by FDA under an Emergency Use Authorization (EUA). This EUA will remain  in effect (meaning this test can be used) for the duration of the COVID-19 declaration under Section 564(b)(1) of the Act, 21 U.S.C.section 360bbb-3(b)(1), unless the authorization is terminated  or revoked sooner.       Influenza A by PCR NEGATIVE NEGATIVE Final   Influenza B by PCR NEGATIVE NEGATIVE Final    Comment: (NOTE) The Xpert Xpress SARS-CoV-2/FLU/RSV plus assay is intended as an aid in the diagnosis of influenza from Nasopharyngeal swab specimens and should not be used as a sole basis for treatment. Nasal washings and aspirates are unacceptable for Xpert Xpress SARS-CoV-2/FLU/RSV testing.  Fact Sheet for Patients: BloggerCourse.com  Fact Sheet for Healthcare Providers: SeriousBroker.it  This test is not yet approved or cleared by the [place] FDA and has been authorized for detection and/or diagnosis of SARS-CoV-2 by FDA under an Emergency Use Authorization (EUA). This EUA will remain in effect (meaning this test can be used) for the duration of the COVID-19 declaration under Section 564(b)(1) of the Act, 21 U.S.C. section 360bbb-3(b)(1), unless the authorization is terminated or revoked.     Resp Syncytial Virus by PCR NEGATIVE NEGATIVE Final    Comment: (NOTE) Fact Sheet for Patients: BloggerCourse.com  Fact Sheet for Healthcare Providers: SeriousBroker.it  This test is not yet approved or cleared by the [place] FDA and has been authorized for detection and/or diagnosis of SARS-CoV-2 by FDA under an Emergency Use Authorization (EUA). This EUA will remain in effect (meaning this test can be used) for the duration of the COVID-19 declaration under Section 564(b)(1) of the Act, 21 U.S.C. section 360bbb-3(b)(1), unless the authorization is terminated or revoked.  Performed at [facility], [address], [place], [place] [address]   Resp panel by RT-PCR (RSV, Flu A&B, Covid) Anterior Nasal Swab     Status:  None   Collection Time: 05/17/23  8:12 PM   Specimen: Anterior Nasal Swab  Result Value Ref Range Status   SARS Coronavirus 2 by RT PCR NEGATIVE NEGATIVE Final    Comment: (NOTE) SARS-CoV-2 target nucleic acids are NOT DETECTED.  The SARS-CoV-2 RNA is generally detectable in upper respiratory specimens during the acute phase of infection. The lowest concentration of SARS-CoV-2 viral copies this assay can detect is 138 copies/mL. A negative result does not preclude SARS-Cov-2 infection and should not be used as the sole basis for treatment or other patient management decisions. A negative result may occur with  improper specimen collection/handling, submission of specimen other than nasopharyngeal swab, presence of viral mutation(s) within the areas targeted by this assay, and inadequate number of viral copies(<138 copies/mL). A negative result must be combined with clinical observations, patient history, and epidemiological information. The expected result is Negative.  Fact Sheet for Patients:  BloggerCourse.com  Fact Sheet for Healthcare Providers:  SeriousBroker.it  This test is no t yet approved or cleared by the [place] FDA and  has been authorized for detection and/or diagnosis of SARS-CoV-2 by FDA under an Emergency Use Authorization (EUA). This EUA will remain  in effect (meaning this test can be  used) for the duration of the COVID-19 declaration under Section 564(b)(1) of the Act, 21 U.S.C.section 360bbb-3(b)(1), unless the authorization is terminated  or revoked sooner.       Influenza A by PCR NEGATIVE NEGATIVE Final   Influenza B by PCR NEGATIVE NEGATIVE Final    Comment: (NOTE) The Xpert Xpress SARS-CoV-2/FLU/RSV plus assay is intended as an aid in the diagnosis of influenza from Nasopharyngeal swab specimens and should not be used as a sole basis for treatment. Nasal washings and aspirates are unacceptable for Xpert Xpress SARS-CoV-2/FLU/RSV testing.  Fact Sheet for Patients: BloggerCourse.com  Fact Sheet for Healthcare Providers: SeriousBroker.it  This test is not yet approved or cleared by the [place] FDA and has been authorized for detection and/or diagnosis of SARS-CoV-2 by FDA under an Emergency Use Authorization (EUA). This EUA will remain in effect (meaning this test can be used) for the duration of the COVID-19 declaration under Section 564(b)(1) of the Act, 21 U.S.C. section 360bbb-3(b)(1), unless the authorization is terminated or revoked.     Resp Syncytial Virus by PCR NEGATIVE NEGATIVE Final    Comment: (NOTE) Fact Sheet for Patients: BloggerCourse.com  Fact Sheet for Healthcare Providers: SeriousBroker.it  This test is not yet approved or cleared by the [place] FDA and has been authorized for detection and/or diagnosis of SARS-CoV-2 by FDA under an Emergency Use Authorization (EUA). This EUA will remain in effect (meaning this test can be used) for the duration of the COVID-19 declaration under Section 564(b)(1) of the Act, 21 U.S.C. section 360bbb-3(b)(1), unless the authorization is terminated or revoked.  Performed at [facility], [address] [place], [place] [address]   Respiratory (~20 pathogens) panel  by PCR     Status: None   Collection Time: 05/18/23  1:37 AM   Specimen: Nasopharyngeal Swab; Respiratory  Result Value Ref Range Status   Adenovirus NOT DETECTED NOT DETECTED Final   Coronavirus 229E NOT DETECTED NOT DETECTED Final    Comment: (NOTE) The Coronavirus on the Respiratory Panel, DOES NOT test for the novel  Coronavirus (2019 nCoV)    Coronavirus HKU1 NOT DETECTED NOT DETECTED Final   Coronavirus NL63 NOT DETECTED NOT DETECTED Final   Coronavirus OC43 NOT DETECTED  NOT DETECTED Final   Metapneumovirus NOT DETECTED NOT DETECTED Final   Rhinovirus / Enterovirus NOT DETECTED NOT DETECTED Final   Influenza A NOT DETECTED NOT DETECTED Final   Influenza B NOT DETECTED NOT DETECTED Final   Parainfluenza Virus 1 NOT DETECTED NOT DETECTED Final   Parainfluenza Virus 2 NOT DETECTED NOT DETECTED Final   Parainfluenza Virus 3 NOT DETECTED NOT DETECTED Final   Parainfluenza Virus 4 NOT DETECTED NOT DETECTED Final   Respiratory Syncytial Virus NOT DETECTED NOT DETECTED Final   Bordetella pertussis NOT DETECTED NOT DETECTED Final   Bordetella Parapertussis NOT DETECTED NOT DETECTED Final   Chlamydophila pneumoniae NOT DETECTED NOT DETECTED Final   Mycoplasma pneumoniae NOT DETECTED NOT DETECTED Final    Comment: Performed at [facility], [address] [place], [place] [address]         Radiology Studies: CT CERVICAL SPINE WO CONTRAST Result Date: 05/18/2023 CLINICAL DATA:  73 year old male sudden severe headache. Neck pain. EXAM: CT CERVICAL SPINE WITHOUT CONTRAST TECHNIQUE: Multidetector CT imaging of the cervical spine was performed without intravenous contrast. Multiplanar CT image reconstructions were also generated. RADIATION DOSE REDUCTION: This exam was performed according to the departmental dose-optimization program which includes automated exposure control, adjustment of the mA and/or kV according to patient size and/or use of iterative reconstruction technique.  COMPARISON:  Head CT today.  Cervical spine CT 08/20/2021. FINDINGS: Alignment: Maintained cervical lordosis. Cervicothoracic junction alignment is within normal limits. Bilateral posterior element alignment is within normal limits. Skull base and vertebrae: Bone mineralization is within normal limits for age. Mild motion artifact at the skull base. Visualized skull base is intact. No atlanto-occipital dissociation. C1 and C2 appear intact and aligned. No acute osseous abnormality identified. Soft tissues and spinal canal: No prevertebral fluid or swelling. No visible canal hematoma. Pharynx motion artifact. Negative visible noncontrast neck soft tissues. Disc levels: Chronic ankylosis through the cervicothoracic junction and into the visible upper thoracic vertebrae. Progressive hyperostosis since 2023 at C6-C7, now with bridging osteophyte and interbody ankylosis there as well. Elsewhere there is mild for age cervical spine degeneration. Upper chest: Partially visible upper thoracic ankylosis. Lung apices remarkable for moderate to severe emphysema. IMPRESSION: 1.  No acute osseous abnormality in the cervical spine. Progressive lower cervical and upper thoracic hyperostosis since 2023, now with interbody ankylosis from C6 through the visible upper thoracic vertebrae. Mild for age cervical spine degeneration elsewhere. 2.  Emphysema (ICD10-J43.9). Electronically Signed   By: Odessa Fleming M.D.   On: 05/18/2023 05:28   CT HEAD WO CONTRAST ( ) Result Date: 05/18/2023 CLINICAL DATA:  73 year old male sudden severe headache. Neck pain. Earlier CTA chest overnight. EXAM: CT HEAD WITHOUT CONTRAST TECHNIQUE: Contiguous axial images were obtained from the base of the skull through the vertex without intravenous contrast. RADIATION DOSE REDUCTION: This exam was performed according to the departmental dose-optimization program which includes automated exposure control, adjustment of the mA and/or kV according to patient size  and/or use of iterative reconstruction technique. COMPARISON:  Brain MRI 10/12/2021.  Head CT 04/27/2023. FINDINGS: Brain: Stable cerebral volume. No midline shift, ventriculomegaly, mass effect, evidence of mass lesion, intracranial hemorrhage or evidence of cortically based acute infarction. Stable gray-white matter differentiation throughout the brain. Mild to moderate patchy bilateral white matter hypodensity is stable. Vascular: No significant residual intravascular contrast. No suspicious intracranial vascular hyperdensity. Calcified atherosclerosis at the skull base. Skull: Stable and intact.  No acute osseous abnormality identified. Sinuses/Orbits: Ongoing fluid and bubbly opacity  in the left maxillary sinus, not significantly changed from last month. Remaining paranasal sinuses, bilateral tympanic cavities and mastoids are clear. Other: Stable postoperative changes to the right globe. No acute orbit or scalp soft tissue finding. IMPRESSION: 1. No acute intracranial abnormality. Stable non contrast CT appearance of mild to moderate for age white matter disease. 2. Left maxillary sinus inflammation not significantly changed. Electronically Signed   By: Odessa Fleming M.D.   On: 05/18/2023 05:25   CT Angio Chest PE W and/or Wo Contrast Result Date: 05/17/2023 CLINICAL DATA:  Shortness of breath. EXAM: CT ANGIOGRAPHY CHEST WITH CONTRAST TECHNIQUE: Multidetector CT imaging of the chest was performed using the standard protocol during bolus administration of intravenous contrast. Multiplanar CT image reconstructions and MIPs were obtained to evaluate the vascular anatomy. RADIATION DOSE REDUCTION: This exam was performed according to the departmental dose-optimization program which includes automated exposure control, adjustment of the mA and/or kV according to patient size and/or use of iterative reconstruction technique. CONTRAST:  75mL OMNIPAQUE IOHEXOL 350 MG/ML SOLN COMPARISON:  April 28, 2023 FINDINGS:  Cardiovascular: There is mild calcification of the thoracic aorta, without evidence of aortic aneurysm. Satisfactory opacification of the pulmonary arteries to the segmental level. No evidence of pulmonary embolism. Normal heart size with moderate to marked severity coronary artery calcification. No pericardial effusion. Mediastinum/Nodes: Subcentimeter AP window and pretracheal lymph nodes are seen. Thyroid gland, trachea, and esophagus demonstrate no significant findings. Lungs/Pleura: There is evidence of extensive paraseptal and central lobular emphysematous lung disease. Predominantly stable, marked severity lingular and bilateral lower lobe consolidation is noted. Areas of ground-glass appearing lung parenchyma are also seen within these regions. There is a small, stable right pleural effusion. No pneumothorax is identified. Upper Abdomen: There are stable bilateral simple renal cysts. Musculoskeletal: Multilevel degenerative changes are noted throughout the thoracic spine. Review of the MIP images confirms the above findings. IMPRESSION: 1. No evidence of pulmonary embolism. 2. 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 3. Predominantly stable, marked severity lingular and bilateral lower lobe consolidation, consistent with multifocal pneumonia. 4. Small, stable right pleural effusion. 5. Stable bilateral simple renal cysts. No follow-up imaging is recommended. This recommendation follows ACR consensus guidelines: Management of the Incidental Renal Mass on CT: A White Paper of the ACR Incidental Findings Committee. J Am Coll Radiol 2018;15:264-273. 6. Aortic atherosclerosis. Aortic Atherosclerosis (ICD10-I70.0) and Emphysema (ICD10-J43.9). Electronically Signed   By: Aram Candela M.D.   On: 05/17/2023 23:53    Scheduled Meds:  acetaminophen  650 mg Oral TID   arformoterol  15 mcg Nebulization BID   And   umeclidinium bromide  1 puff Inhalation Daily   buPROPion  300 mg  Oral q morning   busPIRone  15 mg Oral q AM   cyclobenzaprine  20 mg Oral QHS   docusate sodium  200 mg Oral BID   DULoxetine  60 mg Oral BID   enoxaparin (LOVENOX) injection  65 mg Subcutaneous Q24H   gabapentin  100 mg Oral TID   insulin aspart  0-9 Units Subcutaneous TID WC   melatonin  6 mg Oral QHS   Oxcarbazepine  600 mg Oral TID   polyethylene glycol  17 g Oral Daily   senna  2 tablet Oral BID   tamsulosin  0.8 mg Oral Daily   traZODone  75 mg Oral QHS   Continuous Infusions:   LOS: 1 day    Time spent: 38 min  Alwyn Ren, MD  05/19/2023, 12:31  PM

## 2023-05-19 NOTE — Progress Notes (Addendum)
 Daily Progress Note   Patient Name: Mario [name]       Date: 05/19/2023 DOB: Oct 28, 1950  Age: 73 y.o. MRN#: [id number] Attending Physician: Alwyn Ren, MD Primary Care Physician: [name] Admit Date: 05/17/2023  Reason for Consultation/Follow-up: Establishing goals of care  Patient Profile/HPI:  73 y.o. male  with past medical history of COPD on oxygen at home, sleep apnea, DM2, mood disorder, history of lung cancer (dx in 2022- LLL mass) s/p SBRT radiation tx 09/2020, 06/2021- malignant pleural effusion, not candidate for systemic tx- had pleurx but this was removed, last seen by [facility] 02/01/23- at that time best supportive care and hospice recommended, pt declined hospice; - he had BAL 04/17/23 here at [facility] that indicated adenocarcinoma of the lungs- per chart review- he was to be seen by [facility]cology on 3/5- no note from that visit available)  admitted on 05/17/2023 with shortness of breath. CT scan reviewed- noted severe lingular and bilateral lower lobe consolidation consistent with multifocal pneumonia, however- per attending note-  his procal is <.10, and leucocytosis is mild on steroids- therefore antibiotics discontinued. Palliative consulted for goals of care.   Subjective: Chart reviewed including labs, progress notes, imaging from this and previous encounters.  [facility] to understand [facility].  Appears he was to followup with them on 3/5, however, he was hospitalized at that time.  Met with patient. He is awake and alert. No current distress. He reports little relief of SOB with prn morphine. We further discussed his cancer diagnosis and goals of care. He was unaware that biopsy in February indicated cancer presence.  He  continues to be interested in Oncology consult. He shares that he was offered targeted therapy in December- however, he and his Oncologist made decision to hold off- this is confirmed by [place] [facility] note which states no targeted therapy "until he had clinical evidence of progression".  Review of Systems  Constitutional:  Positive for malaise/fatigue.  Musculoskeletal:  Positive for joint pain.     Physical Exam Vitals and nursing note reviewed.  Constitutional:      Appearance: He is obese.  Cardiovascular:     Rate and Rhythm: Normal rate.  Pulmonary:     Effort: Pulmonary effort is normal.  Neurological:  Mental Status: He is alert and oriented to person, place, and time.             Vital Signs: BP 125/62 (BP Location: Left Arm)   Pulse 85   Temp 98.1 F (36.7 C)   Resp 18   Wt 136 kg   SpO2 93%   BMI 38.50 kg/m  SpO2: SpO2: 93 % O2 Device: O2 Device: Nasal Cannula O2 Flow Rate: O2 Flow Rate (L/min): 6 L/min  Intake/output summary:  Intake/Output Summary (Last 24 hours) at 05/19/2023 1227 Last data filed at 05/19/2023 1200 Gross per 24 hour  Intake 60 ml  Output 575 ml  Net -515 ml   LBM: Last BM Date : 05/16/23 Baseline Weight: Weight: 136 kg Most recent weight: Weight: 136 kg       Palliative Assessment/Data: PPS: 50%      Patient Active Problem List   Diagnosis Date Noted   Acute respiratory failure with hypoxia (HCC) 05/18/2023   Multifocal pneumonia 05/18/2023   Malignant neoplasm of lung (HCC) 05/06/2023   Acute hypoxemic respiratory failure (HCC) 05/05/2023   Right lower lobe pneumonia 04/28/2023   Epistaxis 04/19/2023   Hemoptysis 04/06/2023   COPD exacerbation (HCC) 04/04/2023   COPD with acute bronchitis (HCC) 04/01/2023   RSV bronchitis 03/22/2023   Community acquired pneumonia 03/17/2023   History of lung cancer 03/17/2023   Acute hypoxic respiratory failure (HCC) 03/17/2023   RSV (respiratory syncytial virus pneumonia) 03/17/2023    Ankylosing spondylitis lumbar region (HCC) 09/25/2022   Vasculitis (HCC) 07/02/2022   Chronic pain 07/02/2022   history of Pulmonary embolism (HCC) 07/02/2022   Anxiety    Acute on chronic hypoxic respiratory failure (HCC) 11/02/2021   Adjustment disorder with depressed mood 10/09/2021   BPH (benign prostatic hyperplasia) 09/23/2021   Fracture of ribs, two, left, sequela 08/24/2021   Metastatic carcinoma (HCC) 08/07/2021   Non-small cell lung cancer, left (HCC) 08/07/2021   Chronic respiratory failure with hypoxia, on home O2 therapy (HCC) - 4 L/min 08/07/2021   Rib pain 08/03/2021   History of falling 07/29/2021   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   DM2 (diabetes mellitus, type 2) (HCC) 07/16/2017   HLA B27 (HLA B27 positive) 07/16/2017   Hyperkalemia    Hypoxia    Dyspnea 05/23/2014   Malingering 01/21/2013   Tobacco abuse 01/11/2013   Obesity, unspecified 01/11/2013   Hypertension associated with diabetes (HCC) 01/10/2013    Palliative Care Assessment & Plan     Assessment/Recommendations/Plan  Continue current care- patient is interested in Oncology consult  while he is in hospital Increase liquid morphine to 5mg  po q 4 hours prn for shortness of breath Constipation- he is on Miralax daily- added senna 2 po bid   Code Status:   Code Status: Limited: Do not attempt resuscitation (DNR) -DNR-LIMITED -Do Not Intubate/DNI    Prognosis:  Unable to determine  Discharge Planning: To Be Determined  Care plan was discussed with patient and his RN.   Thank you for allowing the Palliative Medicine Team to assist in the care of this patient.  Total time:  80 minutes Prolonged billing:  Time includes:   Preparing to see the patient (e.g., review of tests) Obtaining and/or reviewing separately obtained history Performing a medically necessary appropriate examination and/or evaluation Counseling and educating the patient/family/caregiver Ordering medications, tests, or procedures Referring and communicating with other health care professionals (when not reported separately) Documenting clinical information in the electronic or other health record Independently interpreting results (not reported separately) and communicating results to the patient/family/caregiver Care coordination (not reported separately) Clinical documentation  Ocie Bob, AGNP-C Palliative Medicine   Please contact Palliative Medicine Team phone at [phone] for questions and concerns.

## 2023-05-19 NOTE — Plan of Care (Signed)
   Problem: Education: Goal: Ability to describe self-care measures that may prevent or decrease complications (Diabetes Survival Skills Education) will improve Outcome: Progressing Goal: Individualized Educational Video(s) Outcome: Progressing   Problem: Coping: Goal: Ability to adjust to condition or change in health will improve Outcome: Progressing

## 2023-05-19 NOTE — TOC Initial Note (Addendum)
 Transition of Care [facility]) - Initial/Assessment Note    Patient Details  Name: Mario [name] MRN: [id number] Date of Birth: October 02, 1950  [facility] CM/SW Contact:    Otelia Santee, LCSW Phone Number: 05/19/2023, 3:13 PM  Clinical Narrative:                 Pt presenting with SOB. Pt lives at a boarding house with roommates. Pt is on 6L of O2 provided by [employer]. Pt's home O2 is able to be increased to 10L if needed. Pt is active with [facility] for HHPT.  Pt requesting SNF placement as he struggles to ambulate far distances due to SOB. Pt able to ambulate without need for AD or assistance. Pt recommended for hospice services however, is currently declining this.  CSW [facility]email with [facility] [place] [phone] ext. [phone]) to determine if pt has LTC benefits.    ADDENDUM: Per Herbert Seta, [facility] pt is 30% service connected and would need to be at least 70% to qualify for LTC benefits. CSW inquired about ST SNF and was informed that pt has [facility] and would need to exhaust MCR benefits prior to [place] covering ST SNF, which would be through [facility] list not [employer].   Expected Discharge Plan: Home w Home Health Services Barriers to Discharge: No Barriers Identified   Patient Goals and CMS Choice Patient states their goals for this hospitalization and ongoing recovery are:: Wants to go to SNF          Expected Discharge Plan and Services In-house Referral: NA Discharge Planning Services: NA Post Acute Care Choice: Skilled Nursing Facility, Nursing Home Living arrangements for the past 2 months: Boarding House                                      Prior Living Arrangements/Services Living arrangements for the past 2 months: [employer] Lives with:: Roommate Patient language and need for interpreter reviewed:: Yes Do you feel safe going back to the place where you live?: Yes      Need for Family Participation in Patient Care: No  (Comment) Care giver support system in place?: Yes (comment) Current home services: DME, Home PT (O2 w/ [employer],cane,rollator,walker, toilet extension) Criminal Activity/Legal Involvement Pertinent to Current Situation/Hospitalization: No - Comment as needed  Activities of Daily Living   ADL Screening (condition at time of admission) Independently performs ADLs?: Yes (appropriate for developmental age) Is the patient deaf or have difficulty hearing?: No Does the patient have difficulty seeing, even when wearing glasses/contacts?: No Does the patient have difficulty concentrating, remembering, or making decisions?: No  Permission Sought/Granted Permission sought to share information with : Facility Industrial/product designer granted to share information with : No              Emotional Assessment Appearance:: Appears stated age Attitude/Demeanor/Rapport: Gracious Affect (typically observed): Hopeful Orientation: : Oriented to Self, Oriented to Place, Oriented to  Time, Oriented to Situation Alcohol / Substance Use: Not Applicable Psych Involvement: No (comment)  Admission diagnosis:  Acute respiratory failure with hypoxia (HCC) [J96.01] Acute on chronic respiratory failure with hypoxia (HCC) [J96.21] Multifocal pneumonia [J18.9] Patient Active Problem List   Diagnosis Date Noted   Acute respiratory failure with hypoxia (HCC) 05/18/2023   Multifocal pneumonia 05/18/2023   Malignant neoplasm of lung (HCC) 05/06/2023   Acute hypoxemic respiratory failure (HCC) 05/05/2023  Right lower lobe pneumonia 04/28/2023   Epistaxis 04/19/2023   Hemoptysis 04/06/2023   COPD exacerbation (HCC) 04/04/2023   COPD with acute bronchitis (HCC) 04/01/2023   RSV bronchitis 03/22/2023   Community acquired pneumonia 03/17/2023   History of lung cancer 03/17/2023   Acute hypoxic respiratory failure (HCC) 03/17/2023   RSV (respiratory syncytial virus pneumonia) 03/17/2023    Ankylosing spondylitis lumbar region (HCC) 09/25/2022   Vasculitis (HCC) 07/02/2022   Chronic pain 07/02/2022   history of Pulmonary embolism (HCC) 07/02/2022   Anxiety    Acute on chronic hypoxic respiratory failure (HCC) 11/02/2021   Adjustment disorder with depressed mood 10/09/2021   BPH (benign prostatic hyperplasia) 09/23/2021   Fracture of ribs, two, left, sequela 08/24/2021   Metastatic carcinoma (HCC) 08/07/2021   Non-small cell lung cancer, left (HCC) 08/07/2021   Chronic respiratory failure with hypoxia, on home O2 therapy (HCC) - 4 L/min 08/07/2021   Rib pain 08/03/2021   History of falling 07/29/2021   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   DM2 (diabetes mellitus, type 2) (HCC) 07/16/2017   HLA B27 (HLA B27 positive) 07/16/2017   Hyperkalemia    Hypoxia    Dyspnea 05/23/2014   Malingering 01/21/2013   Tobacco abuse 01/11/2013   Obesity, unspecified 01/11/2013   Hypertension associated with diabetes (HCC) 01/10/2013   PCP:  [name] Pharmacy:    [facility] - [place], [place] - [address] [place] [address] Phone: [phone] Fax: [phone]  [facility] [address] [place] [address] Phone: [phone] Fax: [phone]     Social Drivers of Health (SDOH) Social History: SDOH Screenings   Food Insecurity: No Food Insecurity (05/19/2023)  Housing: Low Risk  (05/19/2023)  Transportation Needs: No Transportation Needs (05/19/2023)  Utilities: Not At Risk (05/19/2023)  Financial Resource Strain: Low Risk  (09/23/2020)  Social Connections: Socially Isolated (05/19/2023)  Stress: No Stress Concern Present (09/23/2020)  Tobacco Use: Medium Risk (05/18/2023)   SDOH Interventions:     Readmission Risk Interventions    05/06/2023    3:25 PM 04/24/2023   10:09 AM 03/25/2023    2:23 PM  Readmission Risk Prevention Plan  Transportation Screening Complete Complete Complete  Medication Review Oceanographer) Complete Complete Complete  PCP or Specialist appointment within 3-5 days of discharge Complete    HRI or Home Care Consult Complete Complete Complete  SW Recovery Care/Counseling Consult Complete Complete Complete  Palliative Care Screening Not Applicable Not Applicable Not Applicable  Skilled  Nursing Facility Not Applicable Not Applicable Not Applicable

## 2023-05-19 NOTE — Progress Notes (Signed)
   05/19/23 1921  BiPAP/CPAP/SIPAP  BiPAP/CPAP/SIPAP Pt Type Adult  Reason BIPAP/CPAP not in use Non-compliant

## 2023-05-20 DIAGNOSIS — J189 Pneumonia, unspecified organism: Secondary | ICD-10-CM | POA: Diagnosis not present

## 2023-05-20 LAB — BASIC METABOLIC PANEL
Anion gap: 8 (ref 5–15)
BUN: 13 mg/dL (ref 8–23)
CO2: 30 mmol/L (ref 22–32)
Calcium: 8.7 mg/dL — ABNORMAL LOW (ref 8.9–10.3)
Chloride: 98 mmol/L (ref 98–111)
Creatinine, Ser: 0.89 mg/dL (ref 0.61–1.24)
GFR, Estimated: >60 mL/min (ref >60)
Glucose, Bld: 114 mg/dL — ABNORMAL HIGH (ref 70–99)
Potassium: 4.3 mmol/L (ref 3.5–5.1)
Sodium: 136 mmol/L (ref 135–145)

## 2023-05-20 LAB — GLUCOSE, CAPILLARY
Glucose-Capillary: 117 mg/dL — ABNORMAL HIGH (ref 70–99)
Glucose-Capillary: 82 mg/dL (ref 70–99)
Glucose-Capillary: 159 mg/dL — ABNORMAL HIGH (ref 70–99)
Glucose-Capillary: 109 mg/dL — ABNORMAL HIGH (ref 70–99)

## 2023-05-20 MED ORDER — VITAMIN D 25 MCG (1000 UNIT) PO TABS
1000.0000 [IU] | ORAL_TABLET | Freq: Every day | ORAL | Status: DC
  Administered 2023-05-21 – 2023-05-25 (×5): 1000 [IU] via ORAL
  Filled 2023-05-20 (×5): qty 1

## 2023-05-20 MED ORDER — ALBUTEROL SULFATE (2.5 MG/3ML) 0.083% IN NEBU
2.5000 mg | INHALATION_SOLUTION | Freq: Four times a day (QID) | RESPIRATORY_TRACT | Status: DC | PRN
Start: 2023-05-20 — End: 2023-05-25

## 2023-05-20 MED ORDER — AZELASTINE HCL 0.1 % NA SOLN: 1.0000 | Freq: Two times a day (BID) | NASAL | Status: DC | PRN

## 2023-05-20 MED ORDER — DILTIAZEM HCL ER COATED BEADS 180 MG PO CP24
180.0000 mg | ORAL_CAPSULE | Freq: Every day | ORAL | Status: DC
  Administered 2023-05-20 – 2023-05-23 (×4): 180 mg via ORAL
  Filled 2023-05-20 (×4): qty 1

## 2023-05-20 MED ORDER — OXYCODONE HCL 5 MG PO TABS
10.0000 mg | ORAL_TABLET | Freq: Two times a day (BID) | ORAL | Status: DC | PRN
  Administered 2023-05-20 – 2023-05-24 (×5): 10 mg via ORAL
  Filled 2023-05-20 (×5): qty 2

## 2023-05-20 MED ORDER — HYDRALAZINE HCL 20 MG/ML IJ SOLN
5.0000 mg | Freq: Once | INTRAMUSCULAR | Status: AC
  Administered 2023-05-20: 5 mg via INTRAVENOUS
  Filled 2023-05-20: qty 1

## 2023-05-20 MED ORDER — LACTULOSE 10 GM/15ML PO SOLN: 10.0000 g | Freq: Two times a day (BID) | ORAL | Status: DC | PRN

## 2023-05-20 MED ORDER — HYDRALAZINE HCL 25 MG PO TABS
25.0000 mg | ORAL_TABLET | Freq: Two times a day (BID) | ORAL | Status: DC
  Administered 2023-05-20 – 2023-05-23 (×6): 25 mg via ORAL
  Filled 2023-05-20 (×6): qty 1

## 2023-05-20 MED ORDER — ACETAMINOPHEN 325 MG PO TABS: 650.0000 mg | ORAL_TABLET | Freq: Four times a day (QID) | ORAL | Status: DC | PRN

## 2023-05-20 MED ORDER — POLYVINYL ALCOHOL 1.4 % OP SOLN
1.0000 [drp] | Freq: Four times a day (QID) | OPHTHALMIC | Status: DC | PRN
  Administered 2023-05-25: 1 [drp] via OPHTHALMIC
  Filled 2023-05-20: qty 15

## 2023-05-20 MED ORDER — CYCLOSPORINE 0.05 % OP EMUL
1.0000 [drp] | Freq: Two times a day (BID) | OPHTHALMIC | Status: DC
  Administered 2023-05-20 – 2023-05-25 (×10): 1 [drp] via OPHTHALMIC
  Filled 2023-05-20 (×10): qty 30

## 2023-05-20 NOTE — Plan of Care (Signed)
  Problem: Coping: Goal: Ability to adjust to condition or change in health will improve Outcome: Progressing   Problem: Health Behavior/Discharge Planning: Goal: Ability to manage health-related needs will improve Outcome: Progressing   Problem: Metabolic: Goal: Ability to maintain appropriate glucose levels will improve Outcome: Progressing   Problem: Nutritional: Goal: Maintenance of adequate nutrition will improve Outcome: Progressing   Problem: Skin Integrity: Goal: Risk for impaired skin integrity will decrease Outcome: Progressing   Problem: Education: Goal: Knowledge of General Education information will improve Description: Including pain rating scale, medication(s)/side effects and non-pharmacologic comfort measures Outcome: Progressing

## 2023-05-20 NOTE — Progress Notes (Signed)
 Mobility Specialist - Progress Note   05/20/23 1409  Mobility  Activity Ambulated independently in hallway  Level of Assistance Independent  Assistive Device None  Distance Ambulated (ft) 80 ft  Activity Response Tolerated well  Mobility Referral Yes  Mobility visit 1 Mobility  Mobility Specialist Stop Time (ACUTE ONLY) 1409   Pt received in bed and agreeable to mobility. Upon returning to room, pt desat to 82%. Encouraged pursed lip breaths bringing SpO2 to 91%. No complaints during session . Pt to bed after session with all needs met.    Pre-mobility: 107 HR, 92% SpO2 (6L [place]) During mobility: 122  HR, 90% SpO2 (6L [place]) Post-mobility: 113 HR, 82-91% SPO2 (6L [place])  Chief Technology Officer

## 2023-05-20 NOTE — Progress Notes (Signed)
   05/20/23 1919  BiPAP/CPAP/SIPAP  BiPAP/CPAP/SIPAP Pt Type Adult  Reason BIPAP/CPAP not in use Non-compliant  BiPAP/CPAP /SiPAP Vitals  SpO2 97 %  Bilateral Breath Sounds Clear;Diminished

## 2023-05-20 NOTE — Progress Notes (Signed)
 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  Patient was on a DOAC at home which was stopped due to hemoptysis and epistaxis.  He has stage IV lung cancer followed by [facility] oncology.  He had a bronch on 04/17/2023 showed focal area of granulation tissue that was biopsied which came back positive for adenocarcinoma.   He came to the hospital with complaints of anxiety hypoxia dyspnea on exertion with any minimal activity he is even afraid afraid to walk take a shower or make coffee as it drops his saturation drastically and most of the time he is alone at home though he has a roommate. will [facility].  If that is an option. Admission CT of the neck CT head and chest reviewed rest of the labs reviewed  Assessment & Plan:   Principal Problem:   Multifocal pneumonia Active Problems:   DM2 (diabetes mellitus, type 2) (HCC)   COPD (chronic obstructive pulmonary disease) (HCC)   Non-small cell cancer of 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   Chronic pain   Stage 3a chronic kidney disease (HCC)   OSA (obstructive sleep apnea)   Hypertension associated with diabetes (HCC)   HLA B27 (HLA B27 positive)   Uveitis of both eyes   Metastatic carcinoma (HCC)   Acute respiratory failure with hypoxia (HCC)  #1 end-stage COPD on 6 L of oxygen/lungs CA recurrence adenocarcinoma antibiotics stopped as patient has received multiple courses of antibiotics in the last 3 months.  Respiratory virus panel is negative.  Procalcitonin has been less than 0.10.  Seen by palliative.  Though [facility] he refuses hospice.  He does not want to go home as he  feels he cannot take care of himself.  His oxygen drops with any activities.  His roommate is not able to care for him.  He has had 5 admissions this year in the last 2-1/2 months.  He was suicidal a month ago.  He wants someone to take care of him.  At the same time he does not want to sign up for hospice.  PCCM saw him last admission and [facility] as there was nothing they could offer him here.  He has  agreed to DNR/DNI. Palliative have started him on morphine for dyspnea continue. Will consult oncology to get their opinion about stage IV lung CVA and whether he is a hospice candidate.  #2 constipation MiraLAX Colace and senna  #3 hypertension on Cardizem and hydralazine at home this has been held on admission due to soft BP will restart as needed.  #4 type 2 diabetes last A1c 6.7 he is requesting a regular diet continue carb modified.  #5 sleep apnea on BiPAP  #6 anemia of chronic disease stable hemoglobin  #7 chronic pain continue gabapentin and Trileptal  #8 mood disorder on Cymbalta Wellbutrin and BuSpar  #9 BPH on tamsulosin  #10 history of pulmonary embolism he was on Eliquis during his admission in February 2025 this was stopped during that time due to ongoing hemoptysis followed by he developed epistaxis ENT had to be consulted and nasal pack placed.  Eliquis  was stopped indefinitely at that point.   Estimated body mass index is 38.5 kg/m as calculated from the following:   Height as of this encounter: 6\' 2"  (1.88 m).   Weight as of this encounter: 136 kg.  DVT prophylaxis: Lovenox  code Status: DNR  family Communication: None Disposition Plan:  Status is: Medically stable for discharge    Consultants:  None  Procedures: None Antimicrobials: None  Subjective: No specific complaints other than pain chronic  objective: Vitals:   05/19/23 1944 05/20/23 0536 05/20/23 0856 05/20/23 1037  BP: (!) 158/83 (!) 166/98  (!) 166/98  Pulse: 92  87  87  Resp: 16 18 (!) 22 (!) 22  Temp: 98.2 F (36.8 C) 97.8 F (36.6 C)  97.8 F (36.6 C)  TempSrc: Oral Oral  Oral  SpO2: 95% 95% 95%   Weight:    136 kg  Height:    6\' 2"  (1.88 m)    Intake/Output Summary (Last 24 hours) at 05/20/2023 1053 Last data filed at 05/20/2023 0700 Gross per 24 hour  Intake 120 ml  Output 1200 ml  Net -1080 ml   Filed Weights   05/17/23 1944 05/20/23 1037  Weight: 136 kg 136 kg    Examination:  General exam: Appears in no acute distress Respiratory system: Clear to auscultation. Respiratory effort normal. Cardiovascular system: S1 & S2 heard, RRR. No JVD, murmurs, rubs, gallops or clicks. No pedal edema. Gastrointestinal system: Abdomen is nondistended, soft and nontender. No organomegaly or masses felt. Normal bowel sounds heard. Central nervous system: Alert and oriented. No focal neurological deficits. Extremities: Symmetric 5 x 5 power  Data Reviewed: I have personally reviewed following labs and imaging studies  CBC: Recent Labs  Lab 05/14/23 0614 05/16/23 1529 05/17/23 2012 05/18/23 0137 05/19/23 0520  WBC 11.9* 13.9* 13.2* 13.6* 10.0  NEUTROABS  --  11.1* 10.0*  --   --   HGB 12.2* 12.4* 12.6* 12.2* 11.7*  HCT 39.9 38.2* 38.5* 38.7* 36.8*  MCV 92.1 87.6 87.5 89.2 89.8  PLT 389 392 434* 403* 359   Basic Metabolic Panel: Recent Labs  Lab 05/14/23 0614 05/16/23 1529 05/17/23 2012 05/18/23 0137 05/18/23 0328 05/20/23 0558  NA 134* 130* 134*  --  133* 136  K 4.3 4.2 4.4  --  4.1 4.3  CL 95* 95* 101  --  97* 98  CO2 29 27 24   --  25 30  GLUCOSE 119* 116* 177*  --  166* 114*  BUN 15 12 14   --  19 13  CREATININE 0.76 0.75 0.80 1.07 1.18 0.89  CALCIUM 8.6* 8.6* 8.7*  --  8.7* 8.7*   GFR: Estimated Creatinine Clearance: 111.7 mL/min (by C-G formula based on SCr of 0.89 mg/dL). Liver Function Tests: Recent Labs  Lab 05/14/23 0614 05/16/23 1529 05/17/23 2012  AST 15 14* 25  ALT 17 17 17   ALKPHOS 77 85 89  BILITOT  0.6 0.5 0.6  PROT 6.2* 6.8 7.6  ALBUMIN 3.3* 3.3* 3.7   No results for input(s): "LIPASE", "AMYLASE" in the last 168 hours. No results for input(s): "AMMONIA" in the last 168 hours. Coagulation Profile: No results for input(s): "INR", "PROTIME" in the last 168 hours. Cardiac Enzymes: No results for input(s): "CKTOTAL", "CKMB", "CKMBINDEX", "TROPONINI" in the last 168 hours. BNP (last 3 results) No results for input(s): "PROBNP" in the last 8760 hours. HbA1C: No results for input(s): "HGBA1C" in the last 72 hours. CBG: Recent Labs  Lab 05/19/23 [phone] 05/19/23  1145 05/19/23 1631 05/19/23 2128 05/20/23 0726  GLUCAP 112* 91 108* 109* 117*   Lipid Profile: No results for input(s): "CHOL", "HDL", "LDLCALC", "TRIG", "CHOLHDL", "LDLDIRECT" in the last 72 hours. Thyroid Function Tests: Recent Labs    05/18/23 0137  TSH 3.932   Anemia Panel: Recent Labs    05/18/23 0328  VITAMINB12 538  FOLATE 19.9  FERRITIN 81  TIBC 428  IRON 42*  RETICCTPCT 2.0   Sepsis Labs: No results for input(s): "PROCALCITON", "LATICACIDVEN" in the last 168 hours.  Recent Results (from the past 240 hours)  Resp panel by RT-PCR (RSV, Flu A&B, Covid) Anterior Nasal Swab     Status: None   Collection Time: 05/16/23  3:31 PM   Specimen: Anterior Nasal Swab  Result Value Ref Range Status   SARS Coronavirus 2 by RT PCR NEGATIVE NEGATIVE Final    Comment: (NOTE) SARS-CoV-2 target nucleic acids are NOT DETECTED.  The SARS-CoV-2 RNA is generally detectable in upper respiratory specimens during the acute phase of infection. The lowest concentration of SARS-CoV-2 viral copies this assay can detect is 138 copies/mL. A negative result does not preclude SARS-Cov-2 infection and should not be used as the sole basis for treatment or other patient management decisions. A negative result may occur with  improper specimen collection/handling, submission of specimen other than nasopharyngeal swab, presence of  viral mutation(s) within the areas targeted by this assay, and inadequate number of viral copies(<138 copies/mL). A negative result must be combined with clinical observations, patient history, and epidemiological information. The expected result is Negative.  Fact Sheet for Patients:  BloggerCourse.com  Fact Sheet for Healthcare Providers:  SeriousBroker.it  This test is no t yet approved or cleared by the [place] FDA and  has been authorized for detection and/or diagnosis of SARS-CoV-2 by FDA under an Emergency Use Authorization (EUA). This EUA will remain  in effect (meaning this test can be used) for the duration of the COVID-19 declaration under Section 564(b)(1) of the Act, 21 U.S.C.section 360bbb-3(b)(1), unless the authorization is terminated  or revoked sooner.       Influenza A by PCR NEGATIVE NEGATIVE Final   Influenza B by PCR NEGATIVE NEGATIVE Final    Comment: (NOTE) The Xpert Xpress SARS-CoV-2/FLU/RSV plus assay is intended as an aid in the diagnosis of influenza from Nasopharyngeal swab specimens and should not be used as a sole basis for treatment. Nasal washings and aspirates are unacceptable for Xpert Xpress SARS-CoV-2/FLU/RSV testing.  Fact Sheet for Patients: BloggerCourse.com  Fact Sheet for Healthcare Providers: SeriousBroker.it  This test is not yet approved or cleared by the [place] FDA and has been authorized for detection and/or diagnosis of SARS-CoV-2 by FDA under an Emergency Use Authorization (EUA). This EUA will remain in effect (meaning this test can be used) for the duration of the COVID-19 declaration under Section 564(b)(1) of the Act, 21 U.S.C. section 360bbb-3(b)(1), unless the authorization is terminated or revoked.     Resp Syncytial Virus by PCR NEGATIVE NEGATIVE Final    Comment: (NOTE) Fact Sheet for  Patients: BloggerCourse.com  Fact Sheet for Healthcare Providers: SeriousBroker.it  This test is not yet approved or cleared by the [place] FDA and has been authorized for detection and/or diagnosis of SARS-CoV-2 by FDA under an Emergency Use Authorization (EUA). This EUA will remain in effect (meaning this test can be used) for the duration of the COVID-19 declaration under Section 564(b)(1) of the Act, 21 U.S.C. section 360bbb-3(b)(1), unless the authorization  is terminated or revoked.  Performed at [facility], [address], [place], [place] [address]   Resp panel by RT-PCR (RSV, Flu A&B, Covid) Anterior Nasal Swab     Status: None   Collection Time: 05/17/23  8:12 PM   Specimen: Anterior Nasal Swab  Result Value Ref Range Status   SARS Coronavirus 2 by RT PCR NEGATIVE NEGATIVE Final    Comment: (NOTE) SARS-CoV-2 target nucleic acids are NOT DETECTED.  The SARS-CoV-2 RNA is generally detectable in upper respiratory specimens during the acute phase of infection. The lowest concentration of SARS-CoV-2 viral copies this assay can detect is 138 copies/mL. A negative result does not preclude SARS-Cov-2 infection and should not be used as the sole basis for treatment or other patient management decisions. A negative result may occur with  improper specimen collection/handling, submission of specimen other than nasopharyngeal swab, presence of viral mutation(s) within the areas targeted by this assay, and inadequate number of viral copies(<138 copies/mL). A negative result must be combined with clinical observations, patient history, and epidemiological information. The expected result is Negative.  Fact Sheet for Patients:  BloggerCourse.com  Fact Sheet for Healthcare Providers:  SeriousBroker.it  This test is no t yet approved or cleared by the [place] FDA and  has been authorized for detection and/or diagnosis of SARS-CoV-2 by FDA under an Emergency Use Authorization (EUA). This EUA will remain  in effect (meaning this test can be used) for the duration of the COVID-19 declaration under Section 564(b)(1) of the Act, 21 U.S.C.section 360bbb-3(b)(1), unless the authorization is terminated  or revoked sooner.       Influenza A by PCR NEGATIVE NEGATIVE Final   Influenza B by PCR NEGATIVE NEGATIVE Final    Comment: (NOTE) The Xpert Xpress SARS-CoV-2/FLU/RSV plus assay is intended as an aid in the diagnosis of influenza from Nasopharyngeal swab specimens and should not be used as a sole basis for treatment. Nasal washings and aspirates are unacceptable for Xpert Xpress SARS-CoV-2/FLU/RSV testing.  Fact Sheet for Patients: BloggerCourse.com  Fact Sheet for Healthcare Providers: SeriousBroker.it  This test is not yet approved or cleared by the [place] FDA and has been authorized for detection and/or diagnosis of SARS-CoV-2 by FDA under an Emergency Use Authorization (EUA). This EUA will remain in effect (meaning this test can be used) for the duration of the COVID-19 declaration under Section 564(b)(1) of the Act, 21 U.S.C. section 360bbb-3(b)(1), unless the authorization is terminated or revoked.     Resp Syncytial Virus by PCR NEGATIVE NEGATIVE Final    Comment: (NOTE) Fact Sheet for Patients: BloggerCourse.com  Fact Sheet for Healthcare Providers: SeriousBroker.it  This test is not yet approved or cleared by the [place] FDA and has been authorized for detection and/or diagnosis of SARS-CoV-2 by FDA under an Emergency Use Authorization (EUA). This EUA will remain in effect (meaning this test can be used) for the duration of the COVID-19 declaration under Section 564(b)(1) of the Act, 21 U.S.C. section  360bbb-3(b)(1), unless the authorization is terminated or revoked.  Performed at [facility], [address] [place], [place] [address]   Respiratory (~20 pathogens) panel by PCR     Status: None   Collection Time: 05/18/23  1:37 AM   Specimen: Nasopharyngeal Swab; Respiratory  Result Value Ref Range Status   Adenovirus NOT DETECTED NOT DETECTED Final   Coronavirus 229E NOT DETECTED NOT DETECTED Final    Comment: (NOTE) The Coronavirus on the Respiratory  Panel, DOES NOT test for the novel  Coronavirus (2019 nCoV)    Coronavirus HKU1 NOT DETECTED NOT DETECTED Final   Coronavirus NL63 NOT DETECTED NOT DETECTED Final   Coronavirus OC43 NOT DETECTED NOT DETECTED Final   Metapneumovirus NOT DETECTED NOT DETECTED Final   Rhinovirus / Enterovirus NOT DETECTED NOT DETECTED Final   Influenza A NOT DETECTED NOT DETECTED Final   Influenza B NOT DETECTED NOT DETECTED Final   Parainfluenza Virus 1 NOT DETECTED NOT DETECTED Final   Parainfluenza Virus 2 NOT DETECTED NOT DETECTED Final   Parainfluenza Virus 3 NOT DETECTED NOT DETECTED Final   Parainfluenza Virus 4 NOT DETECTED NOT DETECTED Final   Respiratory Syncytial Virus NOT DETECTED NOT DETECTED Final   Bordetella pertussis NOT DETECTED NOT DETECTED Final   Bordetella Parapertussis NOT DETECTED NOT DETECTED Final   Chlamydophila pneumoniae NOT DETECTED NOT DETECTED Final   Mycoplasma pneumoniae NOT DETECTED NOT DETECTED Final    Comment: Performed at [facility], [address] [place], [place] [address]         Radiology Studies: No results found.   Scheduled Meds:  acetaminophen  650 mg Oral TID   arformoterol  15 mcg Nebulization BID   And   umeclidinium bromide  1 puff Inhalation Daily   buPROPion  300 mg Oral q morning   busPIRone  15 mg Oral q AM   cyclobenzaprine  20 mg Oral QHS   docusate sodium  200 mg Oral BID   DULoxetine  60 mg Oral BID   enoxaparin (LOVENOX) injection  65 mg  Subcutaneous Q24H   gabapentin  100 mg Oral TID   insulin aspart  0-9 Units Subcutaneous TID WC   melatonin  6 mg Oral QHS   nystatin   Topical TID   Oxcarbazepine  600 mg Oral TID   polyethylene glycol  17 g Oral Daily   senna  2 tablet Oral BID   tamsulosin  0.8 mg Oral Daily   traZODone  75 mg Oral QHS   Continuous Infusions:   LOS: 2 days    Time spent: 38 min  Alwyn Ren, MD  05/20/2023, 10:53 AM

## 2023-05-20 NOTE — Plan of Care (Signed)

## 2023-05-21 DIAGNOSIS — J189 Pneumonia, unspecified organism: Secondary | ICD-10-CM | POA: Diagnosis not present

## 2023-05-21 LAB — LEGIONELLA PNEUMOPHILA SEROGP 1 UR AG: L. pneumophila Serogp 1 Ur Ag: NEGATIVE

## 2023-05-21 LAB — GLUCOSE, CAPILLARY
Glucose-Capillary: 122 mg/dL — ABNORMAL HIGH (ref 70–99)
Glucose-Capillary: 119 mg/dL — ABNORMAL HIGH (ref 70–99)
Glucose-Capillary: 110 mg/dL — ABNORMAL HIGH (ref 70–99)
Glucose-Capillary: 122 mg/dL — ABNORMAL HIGH (ref 70–99)

## 2023-05-21 MED ORDER — CALCIUM CARBONATE ANTACID 500 MG PO CHEW
1.0000 | CHEWABLE_TABLET | Freq: Once | ORAL | Status: AC
Start: 2023-05-22 — End: 2023-05-21
  Administered 2023-05-21: 200 mg via ORAL
  Filled 2023-05-21: qty 1

## 2023-05-21 MED ORDER — BISACODYL 5 MG PO TBEC
10.0000 mg | DELAYED_RELEASE_TABLET | Freq: Every day | ORAL | Status: DC
  Administered 2023-05-21 – 2023-05-25 (×5): 10 mg via ORAL
  Filled 2023-05-21 (×5): qty 2

## 2023-05-21 MED ORDER — ONDANSETRON HCL 4 MG/2ML IJ SOLN
4.0000 mg | Freq: Once | INTRAMUSCULAR | Status: AC | PRN
  Administered 2023-05-23: 4 mg via INTRAVENOUS
  Filled 2023-05-21: qty 2

## 2023-05-21 MED ORDER — SMOG ENEMA
300.0000 mL | Freq: Once | RECTAL | Status: AC
  Administered 2023-05-22: 300 mL via RECTAL
  Filled 2023-05-21: qty 960

## 2023-05-21 MED ORDER — LACTULOSE 10 GM/15ML PO SOLN
30.0000 g | ORAL | Status: AC
  Administered 2023-05-21 (×3): 30 g via ORAL
  Filled 2023-05-21 (×3): qty 45

## 2023-05-21 NOTE — Progress Notes (Signed)
 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  Patient was on a DOAC at home which was stopped due to hemoptysis and epistaxis.  He has stage IV lung cancer followed by [facility] oncology.  He had a bronch on 04/17/2023 showed focal area of granulation tissue that was biopsied which came back positive for adenocarcinoma.   He came to the hospital with complaints of anxiety hypoxia dyspnea on exertion with any minimal activity he is even afraid afraid to walk take a shower or make coffee as it drops his saturation drastically and most of the time he is alone at home though he has a roommate. will [facility].  If that is an option. Admission CT of the neck CT head and chest reviewed rest of the labs reviewed  Assessment & Plan:   Principal Problem:   Multifocal pneumonia Active Problems:   DM2 (diabetes mellitus, type 2) (HCC)   COPD (chronic obstructive pulmonary disease) (HCC)   Non-small cell cancer of 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   Chronic pain   Stage 3a chronic kidney disease (HCC)   OSA (obstructive sleep apnea)   Hypertension associated with diabetes (HCC)   HLA B27 (HLA B27 positive)   Uveitis of both eyes   Metastatic carcinoma (HCC)   Acute respiratory failure with hypoxia (HCC)  #1 end-stage COPD on 6 L of oxygen/lungs CA recurrence adenocarcinoma antibiotics stopped as patient has received multiple courses of antibiotics in the last 3 months.  Respiratory virus panel is negative.  Procalcitonin has been less than 0.10.  Seen by palliative.  Though [facility] he refuses hospice.  He does not want to go home as he  feels he cannot take care of himself.  His oxygen drops with any activities.  His roommate is not able to care for him.  He has had 5 admissions this year in the last 2-1/2 months.  He was suicidal a month ago.  He wants someone to take care of him.  At the same time he does not want to sign up for hospice.  PCCM saw him last admission and [facility] as there was nothing they could offer him here.  He has  agreed to DNR/DNI. Palliative have started him on morphine for dyspnea continue. Appreciate oncology input  #2 constipation MiraLAX Colace and senna Adding lactulose and Dulcolax  #3 hypertension on Cardizem and hydralazine at home this has been held on admission due to soft BP will restart as needed.  #4 type 2 diabetes last A1c 6.7 he is requesting a regular diet continue carb modified.  #5 sleep apnea on BiPAP  #6 anemia of chronic disease stable hemoglobin  #7 chronic pain continue gabapentin and Trileptal  #8 mood disorder on Cymbalta Wellbutrin and BuSpar  #9 BPH on tamsulosin  #10 history of pulmonary embolism he was on Eliquis during his admission in February 2025 this was stopped during that time due to ongoing hemoptysis followed by he developed epistaxis ENT had to be consulted and nasal pack placed.  Eliquis was stopped indefinitely at that point.   Estimated body mass index  is 38.5 kg/m as calculated from the following:   Height as of this encounter: 6\' 2"  (1.88 m).   Weight as of this encounter: 136 kg.  DVT prophylaxis: Lovenox  code Status: DNR  family Communication: None Disposition Plan:  Status is: Medically stable for discharge    Consultants:  None  Procedures: None Antimicrobials: None  Subjective:   Still has not moved his bowels continues to have constipation seen by Dr. Georgiann Mohs this morning objective: Vitals:   05/20/23 2000 05/20/23 2023 05/20/23 2158 05/21/23 0504  BP: (!) 147/85 (!) 162/86 (!) 164/93 (!) 144/91   Pulse:  90  84  Resp:  20  18  Temp:  97.7 F (36.5 C)  97.9 F (36.6 C)  TempSrc:    Oral  SpO2:  96%  95%  Weight:      Height:        Intake/Output Summary (Last 24 hours) at 05/21/2023 1016 Last data filed at 05/21/2023 0728 Gross per 24 hour  Intake 540 ml  Output 2400 ml  Net -1860 ml   Filed Weights   05/17/23 1944 05/20/23 1037  Weight: 136 kg 136 kg    Examination:  General exam: Appears in no acute distress Respiratory system: Clear to auscultation. Respiratory effort normal. Cardiovascular system: S1 & S2 heard, RRR. No JVD, murmurs, rubs, gallops or clicks. No pedal edema. Gastrointestinal system: Abdomen is nondistended, soft and nontender. No organomegaly or masses felt. Normal bowel sounds heard. Central nervous system: Alert and oriented. No focal neurological deficits. Extremities: Symmetric 5 x 5 power  Data Reviewed: I have personally reviewed following labs and imaging studies  CBC: Recent Labs  Lab 05/16/23 1529 05/17/23 2012 05/18/23 0137 05/19/23 0520  WBC 13.9* 13.2* 13.6* 10.0  NEUTROABS 11.1* 10.0*  --   --   HGB 12.4* 12.6* 12.2* 11.7*  HCT 38.2* 38.5* 38.7* 36.8*  MCV 87.6 87.5 89.2 89.8  PLT 392 434* 403* 359   Basic Metabolic Panel: Recent Labs  Lab 05/16/23 1529 05/17/23 2012 05/18/23 0137 05/18/23 0328 05/20/23 0558  NA 130* 134*  --  133* 136  K 4.2 4.4  --  4.1 4.3  CL 95* 101  --  97* 98  CO2 27 24  --  25 30  GLUCOSE 116* 177*  --  166* 114*  BUN 12 14  --  19 13  CREATININE 0.75 0.80 1.07 1.18 0.89  CALCIUM 8.6* 8.7*  --  8.7* 8.7*   GFR: Estimated Creatinine Clearance: 111.7 mL/min (by C-G formula based on SCr of 0.89 mg/dL). Liver Function Tests: Recent Labs  Lab 05/16/23 1529 05/17/23 2012  AST 14* 25  ALT 17 17  ALKPHOS 85 89  BILITOT 0.5 0.6  PROT 6.8 7.6  ALBUMIN 3.3* 3.7   No results for input(s): "LIPASE", "AMYLASE" in the last 168 hours. No results for input(s): "AMMONIA" in the last 168  hours. Coagulation Profile: No results for input(s): "INR", "PROTIME" in the last 168 hours. Cardiac Enzymes: No results for input(s): "CKTOTAL", "CKMB", "CKMBINDEX", "TROPONINI" in the last 168 hours. BNP (last 3 results) No results for input(s): "PROBNP" in the last 8760 hours. HbA1C: No results for input(s): "HGBA1C" in the last 72 hours. CBG: Recent Labs  Lab 05/20/23 0726 05/20/23 1113 05/20/23 1605 05/20/23 2026 05/21/23 0726  GLUCAP 117* 82 159* 109* 122*   Lipid Profile: No results for input(s): "CHOL", "HDL", "LDLCALC", "TRIG", "CHOLHDL", "LDLDIRECT" in the last 72 hours. Thyroid Function Tests: No results  for input(s): "TSH", "T4TOTAL", "FREET4", "T3FREE", "THYROIDAB" in the last 72 hours.  Anemia Panel: No results for input(s): "VITAMINB12", "FOLATE", "FERRITIN", "TIBC", "IRON", "RETICCTPCT" in the last 72 hours.  Sepsis Labs: No results for input(s): "PROCALCITON", "LATICACIDVEN" in the last 168 hours.  Recent Results (from the past 240 hours)  Resp panel by RT-PCR (RSV, Flu A&B, Covid) Anterior Nasal Swab     Status: None   Collection Time: 05/16/23  3:31 PM   Specimen: Anterior Nasal Swab  Result Value Ref Range Status   SARS Coronavirus 2 by RT PCR NEGATIVE NEGATIVE Final    Comment: (NOTE) SARS-CoV-2 target nucleic acids are NOT DETECTED.  The SARS-CoV-2 RNA is generally detectable in upper respiratory specimens during the acute phase of infection. The lowest concentration of SARS-CoV-2 viral copies this assay can detect is 138 copies/mL. A negative result does not preclude SARS-Cov-2 infection and should not be used as the sole basis for treatment or other patient management decisions. A negative result may occur with  improper specimen collection/handling, submission of specimen other than nasopharyngeal swab, presence of viral mutation(s) within the areas targeted by this assay, and inadequate number of viral copies(<138 copies/mL). A negative result  must be combined with clinical observations, patient history, and epidemiological information. The expected result is Negative.  Fact Sheet for Patients:  BloggerCourse.com  Fact Sheet for Healthcare Providers:  SeriousBroker.it  This test is no t yet approved or cleared by the [place] FDA and  has been authorized for detection and/or diagnosis of SARS-CoV-2 by FDA under an Emergency Use Authorization (EUA). This EUA will remain  in effect (meaning this test can be used) for the duration of the COVID-19 declaration under Section 564(b)(1) of the Act, 21 U.S.C.section 360bbb-3(b)(1), unless the authorization is terminated  or revoked sooner.       Influenza A by PCR NEGATIVE NEGATIVE Final   Influenza B by PCR NEGATIVE NEGATIVE Final    Comment: (NOTE) The Xpert Xpress SARS-CoV-2/FLU/RSV plus assay is intended as an aid in the diagnosis of influenza from Nasopharyngeal swab specimens and should not be used as a sole basis for treatment. Nasal washings and aspirates are unacceptable for Xpert Xpress SARS-CoV-2/FLU/RSV testing.  Fact Sheet for Patients: BloggerCourse.com  Fact Sheet for Healthcare Providers: SeriousBroker.it  This test is not yet approved or cleared by the [place] FDA and has been authorized for detection and/or diagnosis of SARS-CoV-2 by FDA under an Emergency Use Authorization (EUA). This EUA will remain in effect (meaning this test can be used) for the duration of the COVID-19 declaration under Section 564(b)(1) of the Act, 21 U.S.C. section 360bbb-3(b)(1), unless the authorization is terminated or revoked.     Resp Syncytial Virus by PCR NEGATIVE NEGATIVE Final    Comment: (NOTE) Fact Sheet for Patients: BloggerCourse.com  Fact Sheet for Healthcare Providers: SeriousBroker.it  This test is  not yet approved or cleared by the [place] FDA and has been authorized for detection and/or diagnosis of SARS-CoV-2 by FDA under an Emergency Use Authorization (EUA). This EUA will remain in effect (meaning this test can be used) for the duration of the COVID-19 declaration under Section 564(b)(1) of the Act, 21 U.S.C. section 360bbb-3(b)(1), unless the authorization is terminated or revoked.  Performed at [facility], [address], [place], [place] [address]   Resp panel by RT-PCR (RSV, Flu A&B, Covid) Anterior Nasal Swab     Status: None   Collection Time: 05/17/23  8:12 PM  Specimen: Anterior Nasal Swab  Result Value Ref Range Status   SARS Coronavirus 2 by RT PCR NEGATIVE NEGATIVE Final    Comment: (NOTE) SARS-CoV-2 target nucleic acids are NOT DETECTED.  The SARS-CoV-2 RNA is generally detectable in upper respiratory specimens during the acute phase of infection. The lowest concentration of SARS-CoV-2 viral copies this assay can detect is 138 copies/mL. A negative result does not preclude SARS-Cov-2 infection and should not be used as the sole basis for treatment or other patient management decisions. A negative result may occur with  improper specimen collection/handling, submission of specimen other than nasopharyngeal swab, presence of viral mutation(s) within the areas targeted by this assay, and inadequate number of viral copies(<138 copies/mL). A negative result must be combined with clinical observations, patient history, and epidemiological information. The expected result is Negative.  Fact Sheet for Patients:  BloggerCourse.com  Fact Sheet for Healthcare Providers:  SeriousBroker.it  This test is no t yet approved or cleared by the [place] FDA and  has been authorized for detection and/or diagnosis of SARS-CoV-2 by FDA under an Emergency Use Authorization (EUA). This EUA will  remain  in effect (meaning this test can be used) for the duration of the COVID-19 declaration under Section 564(b)(1) of the Act, 21 U.S.C.section 360bbb-3(b)(1), unless the authorization is terminated  or revoked sooner.       Influenza A by PCR NEGATIVE NEGATIVE Final   Influenza B by PCR NEGATIVE NEGATIVE Final    Comment: (NOTE) The Xpert Xpress SARS-CoV-2/FLU/RSV plus assay is intended as an aid in the diagnosis of influenza from Nasopharyngeal swab specimens and should not be used as a sole basis for treatment. Nasal washings and aspirates are unacceptable for Xpert Xpress SARS-CoV-2/FLU/RSV testing.  Fact Sheet for Patients: BloggerCourse.com  Fact Sheet for Healthcare Providers: SeriousBroker.it  This test is not yet approved or cleared by the [place] FDA and has been authorized for detection and/or diagnosis of SARS-CoV-2 by FDA under an Emergency Use Authorization (EUA). This EUA will remain in effect (meaning this test can be used) for the duration of the COVID-19 declaration under Section 564(b)(1) of the Act, 21 U.S.C. section 360bbb-3(b)(1), unless the authorization is terminated or revoked.     Resp Syncytial Virus by PCR NEGATIVE NEGATIVE Final    Comment: (NOTE) Fact Sheet for Patients: BloggerCourse.com  Fact Sheet for Healthcare Providers: SeriousBroker.it  This test is not yet approved or cleared by the [place] FDA and has been authorized for detection and/or diagnosis of SARS-CoV-2 by FDA under an Emergency Use Authorization (EUA). This EUA will remain in effect (meaning this test can be used) for the duration of the COVID-19 declaration under Section 564(b)(1) of the Act, 21 U.S.C. section 360bbb-3(b)(1), unless the authorization is terminated or revoked.  Performed at [facility], [address], [place], [place]  [address]   Respiratory (~20 pathogens) panel by PCR     Status: None   Collection Time: 05/18/23  1:37 AM   Specimen: Nasopharyngeal Swab; Respiratory  Result Value Ref Range Status   Adenovirus NOT DETECTED NOT DETECTED Final   Coronavirus 229E NOT DETECTED NOT DETECTED Final    Comment: (NOTE) The Coronavirus on the Respiratory Panel, DOES NOT test for the novel  Coronavirus (2019 nCoV)    Coronavirus HKU1 NOT DETECTED NOT DETECTED Final   Coronavirus NL63 NOT DETECTED NOT DETECTED Final   Coronavirus OC43 NOT DETECTED NOT DETECTED Final   Metapneumovirus NOT DETECTED NOT DETECTED Final  Rhinovirus / Enterovirus NOT DETECTED NOT DETECTED Final   Influenza A NOT DETECTED NOT DETECTED Final   Influenza B NOT DETECTED NOT DETECTED Final   Parainfluenza Virus 1 NOT DETECTED NOT DETECTED Final   Parainfluenza Virus 2 NOT DETECTED NOT DETECTED Final   Parainfluenza Virus 3 NOT DETECTED NOT DETECTED Final   Parainfluenza Virus 4 NOT DETECTED NOT DETECTED Final   Respiratory Syncytial Virus NOT DETECTED NOT DETECTED Final   Bordetella pertussis NOT DETECTED NOT DETECTED Final   Bordetella Parapertussis NOT DETECTED NOT DETECTED Final   Chlamydophila pneumoniae NOT DETECTED NOT DETECTED Final   Mycoplasma pneumoniae NOT DETECTED NOT DETECTED Final    Comment: Performed at [facility], [address] [place], [place] [address]         Radiology Studies: No results found.   Scheduled Meds:  acetaminophen  650 mg Oral TID   arformoterol  15 mcg Nebulization BID   And   umeclidinium bromide  1 puff Inhalation Daily   bisacodyl  10 mg Oral Daily   buPROPion  300 mg Oral q morning   busPIRone  15 mg Oral q AM   cholecalciferol  1,000 Units Oral Daily   cyclobenzaprine  20 mg Oral QHS   cycloSPORINE  1 drop Both Eyes Q12H   diltiazem  180 mg Oral Daily   docusate sodium  200 mg Oral BID   DULoxetine  60 mg Oral BID   enoxaparin (LOVENOX) injection  65 mg Subcutaneous Q24H    gabapentin  100 mg Oral TID   hydrALAZINE  25 mg Oral BID   insulin aspart  0-9 Units Subcutaneous TID WC   lactulose  30 g Oral Q4H   melatonin  6 mg Oral QHS   nystatin   Topical TID   Oxcarbazepine  600 mg Oral TID   polyethylene glycol  17 g Oral Daily   senna  2 tablet Oral BID   tamsulosin  0.8 mg Oral Daily   traZODone  75 mg Oral QHS   Continuous Infusions:   LOS: 3 days    Time spent: 38 min  Alwyn Ren, MD  05/21/2023, 10:16 AM

## 2023-05-21 NOTE — Plan of Care (Signed)
  Problem: Coping: Goal: Ability to adjust to condition or change in health will improve Outcome: Progressing   Problem: Clinical Measurements: Goal: Cardiovascular complication will be avoided Outcome: Progressing   Problem: Activity: Goal: Risk for activity intolerance will decrease Outcome: Progressing   Problem: Nutrition: Goal: Adequate nutrition will be maintained Outcome: Progressing   Problem: Coping: Goal: Level of anxiety will decrease Outcome: Progressing   Problem: Elimination: Goal: Will not experience complications related to bowel motility Outcome: Progressing   Problem: Pain Managment: Goal: General experience of comfort will improve and/or be controlled Outcome: Progressing   Problem: Safety: Goal: Ability to remain free from injury will improve Outcome: Progressing

## 2023-05-21 NOTE — Progress Notes (Signed)
   05/21/23 2125  BiPAP/CPAP/SIPAP  BiPAP/CPAP/SIPAP Pt Type Adult  Reason BIPAP/CPAP not in use Non-compliant  BiPAP/CPAP /SiPAP Vitals  Resp 14  MEWS Score/Color  MEWS Score 0  MEWS Score Color Mario [name]

## 2023-05-21 NOTE — Consult Note (Signed)
 [name] CONSULT NOTE  Patient Care Team: Clinic, [name] as PCP - General [name], MD as Consulting Physician (Oncology)  CHIEF COMPLAINTS/PURPOSE OF CONSULTATION:  Metastatic lung cancer  HISTORY OF PRESENTING ILLNESS:  Patient is 73 year old previously seen by Dr. [name] almost 2 years ago with what appears to be metastatic lung cancer.  He was originally diagnosed with stage IIb lung cancer and was treated with radiation in 2022.  Since then he has not been treated and has been followed by [facility] as well as [facility].  Because of lack of social support he has been having difficulty with coming for appointments.  He expresses his wishes to see Dr. [name] if we can provide transportation for him to come to the cancer center. He was found to have a K-ras mutation and possibly he may be a candidate for oral therapy.  He has been in and out of the hospital multiple times with symptoms related to RSV infection and requiring oxygen feeling short of breath.  He was discharged on 05/16/2023 and got readmitted on 05/21/2023.  I reviewed her records extensively and collaborated the history with the patient.  SUMMARY OF ONCOLOGIC HISTORY: Oncology History  Non-small cell cancer of 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  09/21/2020 Initial Diagnosis   Non-small cell cancer of left lung (HCC)   09/22/2020 Cancer Staging   Staging form: Lung, AJCC 8th Edition - Clinical: Stage IVA (cT3, cN0, cM1a) - Signed by [name], MD on 07/20/2021      MEDICAL HISTORY:  Past Medical History:  Diagnosis Date   Ankylosing spondylitis lumbar region [facility]) 09/25/2022   Anxiety    Bronchitis    COPD (chronic obstructive pulmonary disease) (HCC)    Depression    History of radiation therapy    Left lung- 10/05/20-10/15/20- Dr. Antony Blackbird   Hypertension    lung ca 09/2020   MI (myocardial infarction) [facility])    ????   On home oxygen therapy    4L/min [place]   OSA  (obstructive sleep apnea)    Suicide attempt (HCC)    Tension pneumothorax 06/27/2016   Uveitis     SURGICAL HISTORY: Past Surgical History:  Procedure Laterality Date   BIOPSY  07/03/2021   Procedure: BIOPSY;  Surgeon: Kathi Der, MD;  Location: [facility] ENDOSCOPY;  Service: Gastroenterology;;   BRONCHIAL BIOPSY  07/30/2020   Procedure: BRONCHIAL BIOPSIES;  Surgeon: Josephine Igo, DO;  Location: [facility] ENDOSCOPY;  Service: Pulmonary;;   BRONCHIAL BRUSHINGS  07/30/2020   Procedure: BRONCHIAL BRUSHINGS;  Surgeon: Josephine Igo, DO;  Location: [facility] ENDOSCOPY;  Service: Pulmonary;;   BRONCHIAL NEEDLE ASPIRATION BIOPSY  07/30/2020   Procedure: BRONCHIAL NEEDLE ASPIRATION BIOPSIES;  Surgeon: Josephine Igo, DO;  Location: [facility] ENDOSCOPY;  Service: Pulmonary;;   BRONCHIAL WASHINGS  07/30/2020   Procedure: BRONCHIAL WASHINGS;  Surgeon: Josephine Igo, DO;  Location: [facility] ENDOSCOPY;  Service: Pulmonary;;   BRONCHIAL WASHINGS  04/17/2023   Procedure: BRONCHIAL WASHINGS;  Surgeon: Lorin Glass, MD;  Location: [facility] ENDOSCOPY;  Service: Pulmonary;;   CHEST TUBE INSERTION Left 06/27/2016   CRYOTHERAPY  04/17/2023   Procedure: CRYOTHERAPY;  Surgeon: Lorin Glass, MD;  Location: [facility] ENDOSCOPY;  Service: Pulmonary;;   cryptorchidism     ESOPHAGOGASTRODUODENOSCOPY N/A 07/03/2021   Procedure: ESOPHAGOGASTRODUODENOSCOPY (EGD);  Surgeon: Kathi Der, MD;  Location: [facility] ENDOSCOPY;  Service: Gastroenterology;  Laterality: N/A;   HEMOSTASIS CONTROL  04/17/2023   Procedure: HEMOSTASIS CONTROL;  Surgeon: Lorin Glass, MD;  Location: [facility] ENDOSCOPY;  Service: Pulmonary;;   IR PERC PLEURAL DRAIN W/INDWELL CATH W/IMG GUIDE  08/04/2021   IR REMOVAL OF PLURAL CATH W/CUFF  09/01/2021   SKIN CANCER EXCISION     VIDEO BRONCHOSCOPY Left 04/17/2023   Procedure: VIDEO BRONCHOSCOPY WITHOUT FLUORO;  Surgeon: Lorin Glass, MD;  Location: [facility] ENDOSCOPY;  Service: Pulmonary;  Laterality: Left;   VIDEO BRONCHOSCOPY WITH  ENDOBRONCHIAL NAVIGATION Left 07/30/2020   Procedure: VIDEO BRONCHOSCOPY WITH ENDOBRONCHIAL NAVIGATION;  Surgeon: Josephine Igo, DO;  Location: [facility] ENDOSCOPY;  Service: Pulmonary;  Laterality: Left;    SOCIAL HISTORY: Social History   Socioeconomic History   Marital status: Single    Spouse name: Not on file   Number of children: Not on file   Years of education: Not on file   Highest education level: Not on file  Occupational History   Occupation: retired  Tobacco Use   Smoking status: Former    Current packs/day: 0.00    Average packs/day: 1 pack/day for 35.0 years (35.0 ttl pk-yrs)    Types: Cigarettes    Start date: 05/1981    Quit date: 05/2016    Years since quitting: 6.9    Passive exposure: Past   Smokeless tobacco: Never  Vaping Use   Vaping status: Never Used  Substance and Sexual Activity   Alcohol use: No    Alcohol/week: 0.0 standard drinks of alcohol    Comment: denies use of any drugs or alcohol   Drug use: No   Sexual activity: Not Currently  Other Topics Concern   Not on file  Social History Narrative   Not on file   Social Drivers of Health   Financial Resource Strain: Low Risk  (09/23/2020)   Overall Financial Resource Strain (CARDIA)    Difficulty of Paying Living Expenses: Not hard at all  Food Insecurity: No Food Insecurity (05/19/2023)   Hunger Vital Sign    Worried About Running Out of Food in the Last Year: Never true    Ran Out of Food in the Last Year: Never true  Transportation Needs: No Transportation Needs (05/19/2023)   PRAPARE - Administrator, Civil Service (Medical): No    Lack of Transportation (Non-Medical): No  Physical Activity: Not on file  Stress: No Stress Concern Present (09/23/2020)   [employer] of Occupational Health - Occupational Stress Questionnaire    Feeling of Stress : Not at all  Social Connections: Socially Isolated (05/20/2023)   Social Connection and Isolation Panel [NHANES]    Frequency of  Communication with Friends and Family: Once a week    Frequency of Social Gatherings with Friends and Family: More than three times a week    Attends Religious Services: Never    Database administrator or Organizations: No    Attends Banker Meetings: Never    Marital Status: Divorced  Catering manager Violence: Not At Risk (05/19/2023)   Humiliation, Afraid, Rape, and Kick questionnaire    Fear of Current or Ex-Partner: No    Emotionally Abused: No    Physically Abused: No    Sexually Abused: No    FAMILY HISTORY: Family History  Problem Relation Age of Onset   Dementia Father     ALLERGIES:  is allergic to demerol [meperidine], zocor [simvastatin], beet [beta vulgaris], and liver.  MEDICATIONS:  Current Facility-Administered Medications  Medication Dose Route Frequency Provider Last Rate Last Admin   acetaminophen (TYLENOL)  tablet 650 mg  650 mg Oral TID Barbara Cower, NP   650 mg at 05/20/23 2158   acetaminophen (TYLENOL) tablet 650 mg  650 mg Oral Q6H PRN Alwyn Ren, MD       albuterol (PROVENTIL) (2.5 MG/3ML) 0.083% nebulizer solution 2.5 mg  2.5 mg Nebulization Q6H PRN Alwyn Ren, MD       arformoterol [facility]) nebulizer solution 15 mcg  15 mcg Nebulization BID Eduard Clos, MD   15 mcg at 05/20/23 1919   And   umeclidinium bromide (INCRUSE ELLIPTA) 62.5 MCG/ACT 1 puff  1 puff Inhalation Daily Eduard Clos, MD       azelastine (ASTELIN) 0.1 % nasal spray 1 spray  1 spray Each Nare BID PRN Alwyn Ren, MD       bisacodyl (DULCOLAX) EC tablet 10 mg  10 mg Oral Daily Alwyn Ren, MD       buPROPion (WELLBUTRIN XL) 24 hr tablet 300 mg  300 mg Oral q morning Eduard Clos, MD   300 mg at 05/20/23 1191   busPIRone (BUSPAR) tablet 15 mg  15 mg Oral q AM Eduard Clos, MD   15 mg at 05/21/23 4782   cholecalciferol (VITAMIN D3) 25 MCG (1000 UNIT) tablet 1,000 Units  1,000 Units Oral Daily Alwyn Ren, MD       cyclobenzaprine (FLEXERIL) tablet 20 mg  20 mg Oral QHS Eduard Clos, MD   20 mg at 05/20/23 2159   cycloSPORINE (RESTASIS) 0.05 % ophthalmic emulsion 1 drop  1 drop Both Eyes Q12H Alwyn Ren, MD   1 drop at 05/20/23 2216   diltiazem (CARDIZEM CD) 24 hr capsule 180 mg  180 mg Oral Daily Alwyn Ren, MD   180 mg at 05/20/23 2000   docusate sodium (COLACE) capsule 200 mg  200 mg Oral BID Eduard Clos, MD   200 mg at 05/20/23 2158   DULoxetine (CYMBALTA) DR capsule 60 mg  60 mg Oral BID Eduard Clos, MD   60 mg at 05/20/23 2159   enoxaparin (LOVENOX) injection 65 mg  65 mg Subcutaneous Q24H Jameire, Kouba, RPH   65 mg at 05/20/23 9562   gabapentin (NEURONTIN) capsule 100 mg  100 mg Oral TID Eduard Clos, MD   100 mg at 05/20/23 2159   hydrALAZINE (APRESOLINE) tablet 25 mg  25 mg Oral BID Alwyn Ren, MD   25 mg at 05/20/23 2158   insulin aspart (novoLOG) injection 0-9 Units  0-9 Units Subcutaneous TID WC Eduard Clos, MD   1 Units at 05/21/23 0755   lactulose (CHRONULAC) 10 GM/15ML solution 10 g  10 g Oral BID PRN Alwyn Ren, MD       lactulose (CHRONULAC) 10 GM/15ML solution 30 g  30 g Oral Q4H Alwyn Ren, MD       melatonin tablet 6 mg  6 mg Oral QHS Eduard Clos, MD   6 mg at 05/20/23 2157   morphine 10 MG/5ML solution 5 mg  5 mg Oral Q4H PRN Barbara Cower, NP   5 mg at 05/19/23 1350   nystatin (MYCOSTATIN/NYSTOP) topical powder   Topical TID Alwyn Ren, MD   Given at 05/20/23 2159   ondansetron (ZOFRAN-ODT) disintegrating tablet 4 mg  4 mg Oral Q8H PRN Barbara Cower, NP   4 mg at 05/19/23 1937   Oxcarbazepine (TRILEPTAL) tablet 600 mg  600 mg Oral TID Eduard Clos, MD   600 mg at 05/20/23 2159   oxyCODONE (Oxy IR/ROXICODONE) immediate release tablet 10 mg  10 mg Oral BID PRN Alwyn Ren, MD   10 mg at 05/21/23 0754   polyethylene glycol (MIRALAX /  GLYCOLAX) packet 17 g  17 g Oral Daily Alwyn Ren, MD   17 g at 05/19/23 1610   polyvinyl alcohol (LIQUIFILM TEARS) 1.4 % ophthalmic solution 1 drop  1 drop Both Eyes QID PRN Alwyn Ren, MD       senna (SENOKOT) tablet 17.2 mg  2 tablet Oral BID Barbara Cower, NP   17.2 mg at 05/20/23 2158   sodium chloride (OCEAN) 0.65 % nasal spray 1 spray  1 spray Each Nare PRN Alwyn Ren, MD   1 spray at 05/19/23 1607   tamsulosin (FLOMAX) capsule 0.8 mg  0.8 mg Oral Daily Eduard Clos, MD   0.8 mg at 05/20/23 9604   traZODone (DESYREL) tablet 75 mg  75 mg Oral QHS Eduard Clos, MD   75 mg at 05/20/23 2157    REVIEW OF SYSTEMS:   Constitutional: Shortness of breath   All other systems were reviewed with the patient and are negative.  PHYSICAL EXAMINATION: ECOG PERFORMANCE STATUS: 1 - Symptomatic but completely ambulatory  Vitals:   05/20/23 2158 05/21/23 0504  BP: (!) 164/93 (!) 144/91  Pulse:  84  Resp:  18  Temp:  97.9 F (36.6 C)  SpO2:  95%   Filed Weights   05/17/23 1944 05/20/23 1037  Weight: 299 lb 13.2 oz (136 kg) 299 lb 13.2 oz (136 kg)    GENERAL:alert, shortness of breath at rest requiring oxygen    LABORATORY DATA:  I have reviewed the data as listed Lab Results  Component Value Date   WBC 10.0 05/19/2023   HGB 11.7 (L) 05/19/2023   HCT 36.8 (L) 05/19/2023   MCV 89.8 05/19/2023   PLT 359 05/19/2023   Lab Results  Component Value Date   NA 136 05/20/2023   K 4.3 05/20/2023   CL 98 05/20/2023   CO2 30 05/20/2023    RADIOGRAPHIC STUDIES: I have personally reviewed the radiological reports and agreed with the findings in the report.  ASSESSMENT AND PLAN:   Metastatic lung cancer: With a K-ras mutation.  Patient has been alive with a diagnosis of metastatic lung cancer for 2 years without being on any therapy.  He informs me that he wishes to pursue treatment if we can provide him help with getting to the appointments  with transportation.  He tells me that he is not ready to go into hospice care. Chronic shortness of breath requiring oxygen possibly related to recent RSV infection and underlying emphysema. I discussed with him extensively that treatments cannot be done if he does not have reliable way to getting back and forth for appointments and if he is not motivated to receive them.  Patient is interested in coming to our cancer center instead of [facility] or [place] hospital.  I will request Dr. [name] to see him tomorrow to discuss if oral therapies for lung cancer would be appropriate considering his condition.    All questions were answered. The patient knows to call the clinic with any problems, questions or concerns.    Tamsen Meek, MD @T @

## 2023-05-21 NOTE — Progress Notes (Signed)
 Mobility Specialist - Progress Note   05/21/23 1033  Mobility  Activity Ambulated independently in hallway  Level of Assistance Independent  Assistive Device None  Distance Ambulated (ft) 80 ft  Activity Response Tolerated well  Mobility Referral Yes  Mobility visit 1 Mobility  Mobility Specialist Start Time (ACUTE ONLY) 1023  Mobility Specialist Stop Time (ACUTE ONLY) 1033  Mobility Specialist Time Calculation (min) (ACUTE ONLY) 10 min   Pt received in bed and agreeable to mobility. Upon returning to room, pt desat to 83%. Encouraged pursed lip breaths bringing SpO to 92%. No complaints during session. Pt to bed after session with all needs met.    Pre-mobility: 106 HR, 92% SpO2 (6L [place]) During mobility: 121 HR, 83% SpO2 (6L [place]) Post-mobility: 108 HR, 90% SPO2 (6L [place])  Chief Technology Officer

## 2023-05-22 DIAGNOSIS — J189 Pneumonia, unspecified organism: Secondary | ICD-10-CM | POA: Diagnosis not present

## 2023-05-22 LAB — GLUCOSE, CAPILLARY
Glucose-Capillary: 140 mg/dL — ABNORMAL HIGH (ref 70–99)
Glucose-Capillary: 141 mg/dL — ABNORMAL HIGH (ref 70–99)
Glucose-Capillary: 153 mg/dL — ABNORMAL HIGH (ref 70–99)
Glucose-Capillary: 140 mg/dL — ABNORMAL HIGH (ref 70–99)

## 2023-05-22 MED ORDER — SMOG ENEMA: 400.0000 mL | Freq: Once | RECTAL | Status: DC

## 2023-05-22 MED ORDER — ENOXAPARIN SODIUM 80 MG/0.8ML IJ SOSY
70.0000 mg | PREFILLED_SYRINGE | INTRAMUSCULAR | Status: DC
  Administered 2023-05-23 – 2023-05-25 (×3): 70 mg via SUBCUTANEOUS
  Filled 2023-05-22 (×3): qty 0.8

## 2023-05-22 MED ORDER — SODIUM CHLORIDE 0.9 % IV SOLN: INTRAVENOUS | Status: AC

## 2023-05-22 NOTE — Progress Notes (Signed)
 Physical Therapy Treatment Patient Details Name: [name] MRN: [id number] DOB: May 23, 1950 Today's Date: 05/22/2023   History of Present Illness Pt is a 73 y.o. male presenting to therapy following hospital admission on 05/17/2023 with increased shortness of breath with productive cough, LBP and B LE weakness. Pt has had multiple recent admissions due to acute restpiraotry failure. Pt is chronically hypoxic and on 6 L/min supplemental O2, imaging reveals PNA with COPD changes. Pt PMH Includes but is not limited to: non-small cell lung cancer status post SBRT, vasculitis and ankylosing spondylitis, PE on Eliquis, OSA with CPAP intolerance, type 2 diabetes mellitus, chronic pain, depression, anxiety, and COPD with chronic hypoxic respiratory failure.    PT Comments  Pt in bed, agreeable to PT prior to his lunch arriving. 4NW trialed to assess endurance response and allow for seated rest breaks prn. Pt is able to amb x30 feet, takes standing rest break, and then returns to room. O2 fluctuates 82%-93%, lowest with transfers, 85% with amb, recovers well with rest and pursed lip breathing. Pt states that he has 4WW at home but it is too big, stairs are his biggest limitation and he desats quickly when attempting, [facility]. His room is nearby at the top of the stairs as we discussed energy conservation layout techniques (chairs nearby). Returns to sitting EOB with lunch, callbell nearby.    If plan is discharge home, recommend the following: Help with stairs or ramp for entrance;Assistance with cooking/housework;Assist for transportation   Can travel by private vehicle        Equipment Recommendations  None recommended by PT    Recommendations for Other Services       Precautions / Restrictions Precautions Precautions: Fall Recall of Precautions/Restrictions: Intact Precaution/Restrictions Comments: Monitor O2, 5L O2  baseline-currently 6L Restrictions Weight Bearing Restrictions Per Provider Order: No     Mobility  Bed Mobility Overal bed mobility: Modified Independent                  Transfers Overall transfer level: Independent Equipment used: None Transfers: Sit to/from Stand Sit to Stand: Supervision           General transfer comment: light headed with STS    Ambulation/Gait Ambulation/Gait assistance: Contact guard assist Gait Distance (Feet): 60 Feet Assistive device: Rollator (4 wheels) (trialed for dec endurance-pt has one at home but states that it is too big to use around the house) Gait Pattern/deviations: Decreased stride length, Step-through pattern, Narrow base of support Gait velocity: dec     General Gait Details: Pt amb with 4WW today to assess endurance response. Able to complete a greater distance and maintains upright posture/gait line, does not use the walker for recovery period.   Stairs             Wheelchair Mobility     Tilt Bed    Modified Rankin (Stroke Patients Only)       Balance Overall balance assessment: Independent Sitting-balance support: No upper extremity supported, Feet supported Sitting balance-Leahy Scale: Good     Standing balance support: No upper extremity supported Standing balance-Leahy Scale: Good                              Communication Communication Communication: No apparent difficulties  Cognition Arousal: Alert Behavior During Therapy: WFL for tasks assessed/performed   PT - Cognitive impairments:  No apparent impairments                       PT - Cognition Comments: AxO x 3 pleasant.  Lives with a Roommate Following commands: Intact      Cueing    Exercises      General Comments General comments (skin integrity, edema, etc.): 6L O2 via [place], desats with activity moreso during transfers than amb      Pertinent Vitals/Pain Pain Assessment Pain Assessment: Faces Faces  Pain Scale: Hurts little more (R shoulder) Pain Location: R shoulder Pain Descriptors / Indicators: Discomfort, Grimacing, Guarding Pain Intervention(s): Limited activity within patient's tolerance, Monitored during session    Home Living                          Prior Function            PT Goals (current goals can now be found in the care plan section) Acute Rehab PT Goals Patient Stated Goal: "not be short of breath" and get rehab so I can go home PT Goal Formulation: With patient Time For Goal Achievement: 06/09/23 Potential to Achieve Goals: Good Progress towards PT goals: Progressing toward goals    Frequency    Min 2X/week      PT Plan      Co-evaluation              AM-PAC PT "6 Clicks" Mobility   Outcome Measure  Help needed turning from your back to your side while in a flat bed without using bedrails?: None Help needed moving from lying on your back to sitting on the side of a flat bed without using bedrails?: None Help needed moving to and from a bed to a chair (including a wheelchair)?: None Help needed standing up from a chair using your arms (e.g., wheelchair or bedside chair)?: None Help needed to walk in hospital room?: A Little Help needed climbing 3-5 steps with a railing? : A Lot 6 Click Score: 21    End of Session Equipment Utilized During Treatment: Oxygen;Gait belt (6L) Activity Tolerance: Patient limited by fatigue Patient left: in bed;with call bell/phone within reach Nurse Communication: Mobility status PT Visit Diagnosis: Difficulty in walking, not elsewhere classified (R26.2);Unsteadiness on feet (R26.81);Pain Pain - Right/Left: Right Pain - part of body: Shoulder     Time: 1610-9604 PT Time Calculation (min) (ACUTE ONLY): 24 min  Charges:    $Gait Training: 23-37 mins PT General Charges $$ ACUTE PT VISIT: 1 Visit                     Madaline Guthrie, PT Acute Rehabilitation Services Office: [phone] 05/22/2023    Evelena Peat 05/22/2023, 3:45 PM

## 2023-05-22 NOTE — Progress Notes (Addendum)
 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 - Last seen in outpatient oncology 07/20/2021.  He was supposed to return in 2 to 3 weeks for follow-up visit however was lost to follow-up.  Patient stated today that he had no transportation and that he called outpatient oncology office several times and left messages asking for assistance with transportation. - Patient had consultation at [facility] 11/10/2021, no chemotherapy was recommended. - Medical oncology/Dr. [name] following  Pneumonia Chronic respiratory failure COPD - Patient was admitted with multifocal pneumonia requiring O2 6 L. - Continue IV antibiotics and respiratory treatment as ordered - Medicine following  Anemia - Mild - Hemoglobin 11.7 today - No transfusional intervention required at this time - Continue to monitor CBC with differential  Mood disorder/mental disorder Depression History of suicidal ideation - On Cymbalta BuSpar and Wellbutrin - Recommend psych eval - Monitor closely   Code Status DNR-limited  Subjective:  Patient seen awake and alert laying on his left side.  Supplemental O2 at 6 L ongoing.  Reports he is breathing okay but that his oxygen level drops whenever he gets up.  When asked why he did not come back to outpatient oncology, he states that he had transportation issues.  No other acute distress is noted  Objective:  Vitals:   05/22/23 0100 05/22/23 0429  BP: 108/61 129/64  Pulse: 82 80  Resp:  20  Temp:  97.6 F (36.4 C)  SpO2: 93% 96%     Intake/Output Summary (Last 24 hours) at 05/22/2023 1031 Last data filed at 05/21/2023 1949 Gross per 24 hour  Intake 240  ml  Output 500 ml  Net -260 ml     REVIEW OF SYSTEMS:   Constitutional: Denies fevers, chills or abnormal night sweats Eyes: Denies blurriness of vision, double vision or watery eyes Ears, nose, mouth, throat, and face: Denies mucositis or sore throat Respiratory: + Dyspnea on exertion Cardiovascular: Denies palpitation, chest discomfort or lower extremity swelling Gastrointestinal:  Denies nausea, heartburn or change in bowel habits Skin: Denies abnormal skin rashes Lymphatics: Denies new lymphadenopathy or easy bruising Neurological: Denies numbness, tingling or new weaknesses Behavioral/Psych: Mood is stable, no new changes  All other systems were reviewed with the patient and are negative.  PHYSICAL EXAMINATION: ECOG PERFORMANCE STATUS: 3 - Symptomatic, >50% confined to bed  Vitals:   05/22/23 0100 05/22/23 0429  BP: 108/61 129/64  Pulse: 82 80  Resp:  20  Temp:  97.6 F (36.4 C)  SpO2: 93% 96%   Filed Weights   05/17/23 1944 05/20/23 1037  Weight: 299 lb 13.2 oz (136 kg) 299 lb 13.2 oz (136 kg)    GENERAL: alert, + mild distress and comfortable SKIN: skin color, texture, turgor are normal, no rashes or significant lesions EYES: normal, conjunctiva are pink and non-injected, sclera clear OROPHARYNX: no exudate, no erythema and lips, buccal mucosa, and tongue normal  NECK: supple, thyroid normal size, non-tender, without nodularity LYMPH: no palpable lymphadenopathy in the cervical, axillary or inguinal LUNGS: clear to auscultation and percussion with normal breathing effort HEART: regular rate & rhythm and no murmurs and no lower extremity edema ABDOMEN: abdomen soft, non-tender and normal bowel  sounds MUSCULOSKELETAL: no cyanosis of digits and no clubbing  PSYCH: alert & oriented x 3 with fluent speech NEURO: no focal motor/sensory deficits   All questions were answered. The patient knows to call the clinic with any problems, questions or concerns.   The total  time spent in the appointment was 40 minutes encounter with patient including review of chart and various tests results, discussions about plan of care and coordination of care plan  Dawson Bills, NP 05/22/2023 10:31 AM    Labs Reviewed:  Lab Results  Component Value Date   WBC 10.0 05/19/2023   HGB 11.7 (L) 05/19/2023   HCT 36.8 (L) 05/19/2023   MCV 89.8 05/19/2023   PLT 359 05/19/2023   Recent Labs    05/14/23 0614 05/16/23 1529 05/17/23 2012 05/18/23 0137 05/18/23 0328 05/20/23 0558  NA 134* 130* 134*  --  133* 136  K 4.3 4.2 4.4  --  4.1 4.3  CL 95* 95* 101  --  97* 98  CO2 29 27 24   --  25 30  GLUCOSE 119* 116* 177*  --  166* 114*  BUN 15 12 14   --  19 13  CREATININE 0.76 0.75 0.80 1.07 1.18 0.89  CALCIUM 8.6* 8.6* 8.7*  --  8.7* 8.7*  GFRNONAA >60 >60 >60 >60 >60 >60  PROT 6.2* 6.8 7.6  --   --   --   ALBUMIN 3.3* 3.3* 3.7  --   --   --   AST 15 14* 25  --   --   --   ALT 17 17 17   --   --   --   ALKPHOS 77 85 89  --   --   --   BILITOT 0.6 0.5 0.6  --   --   --     Studies Reviewed:  CT CERVICAL SPINE WO CONTRAST Result Date: 05/18/2023 CLINICAL DATA:  73 year old male sudden severe headache. Neck pain. EXAM: CT CERVICAL SPINE WITHOUT CONTRAST TECHNIQUE: Multidetector CT imaging of the cervical spine was performed without intravenous contrast. Multiplanar CT image reconstructions were also generated. RADIATION DOSE REDUCTION: This exam was performed according to the departmental dose-optimization program which includes automated exposure control, adjustment of the mA and/or kV according to patient size and/or use of iterative reconstruction technique. COMPARISON:  Head CT today.  Cervical spine CT 08/20/2021. FINDINGS: Alignment: Maintained cervical lordosis. Cervicothoracic junction alignment is within normal limits. Bilateral posterior element alignment is within normal limits. Skull base and vertebrae: Bone mineralization is within normal limits for age. Mild  motion artifact at the skull base. Visualized skull base is intact. No atlanto-occipital dissociation. C1 and C2 appear intact and aligned. No acute osseous abnormality identified. Soft tissues and spinal canal: No prevertebral fluid or swelling. No visible canal hematoma. Pharynx motion artifact. Negative visible noncontrast neck soft tissues. Disc levels: Chronic ankylosis through the cervicothoracic junction and into the visible upper thoracic vertebrae. Progressive hyperostosis since 2023 at C6-C7, now with bridging osteophyte and interbody ankylosis there as well. Elsewhere there is mild for age cervical spine degeneration. Upper chest: Partially visible upper thoracic ankylosis. Lung apices remarkable for moderate to severe emphysema. IMPRESSION: 1.  No acute osseous abnormality in the cervical spine. Progressive lower cervical and upper thoracic hyperostosis since 2023, now with interbody ankylosis from C6 through the visible upper thoracic vertebrae. Mild for age cervical spine degeneration elsewhere. 2.  Emphysema (ICD10-J43.9). Electronically Signed   By: Odessa Fleming M.D.   On:  05/18/2023 05:28   CT HEAD WO CONTRAST ( ) Result Date: 05/18/2023 CLINICAL DATA:  73 year old male sudden severe headache. Neck pain. Earlier CTA chest overnight. EXAM: CT HEAD WITHOUT CONTRAST TECHNIQUE: Contiguous axial images were obtained from the base of the skull through the vertex without intravenous contrast. RADIATION DOSE REDUCTION: This exam was performed according to the departmental dose-optimization program which includes automated exposure control, adjustment of the mA and/or kV according to patient size and/or use of iterative reconstruction technique. COMPARISON:  Brain MRI 10/12/2021.  Head CT 04/27/2023. FINDINGS: Brain: Stable cerebral volume. No midline shift, ventriculomegaly, mass effect, evidence of mass lesion, intracranial hemorrhage or evidence of cortically based acute infarction. Stable gray-white  matter differentiation throughout the brain. Mild to moderate patchy bilateral white matter hypodensity is stable. Vascular: No significant residual intravascular contrast. No suspicious intracranial vascular hyperdensity. Calcified atherosclerosis at the skull base. Skull: Stable and intact.  No acute osseous abnormality identified. Sinuses/Orbits: Ongoing fluid and bubbly opacity in the left maxillary sinus, not significantly changed from last month. Remaining paranasal sinuses, bilateral tympanic cavities and mastoids are clear. Other: Stable postoperative changes to the right globe. No acute orbit or scalp soft tissue finding. IMPRESSION: 1. No acute intracranial abnormality. Stable non contrast CT appearance of mild to moderate for age white matter disease. 2. Left maxillary sinus inflammation not significantly changed. Electronically Signed   By: Odessa Fleming M.D.   On: 05/18/2023 05:25   CT Angio Chest PE W and/or Wo Contrast Result Date: 05/17/2023 CLINICAL DATA:  Shortness of breath. EXAM: CT ANGIOGRAPHY CHEST WITH CONTRAST TECHNIQUE: Multidetector CT imaging of the chest was performed using the standard protocol during bolus administration of intravenous contrast. Multiplanar CT image reconstructions and MIPs were obtained to evaluate the vascular anatomy. RADIATION DOSE REDUCTION: This exam was performed according to the departmental dose-optimization program which includes automated exposure control, adjustment of the mA and/or kV according to patient size and/or use of iterative reconstruction technique. CONTRAST:  75mL OMNIPAQUE IOHEXOL 350 MG/ML SOLN COMPARISON:  April 28, 2023 FINDINGS: Cardiovascular: There is mild calcification of the thoracic aorta, without evidence of aortic aneurysm. Satisfactory opacification of the pulmonary arteries to the segmental level. No evidence of pulmonary embolism. Normal heart size with moderate to marked severity coronary artery calcification. No pericardial  effusion. Mediastinum/Nodes: Subcentimeter AP window and pretracheal lymph nodes are seen. Thyroid gland, trachea, and esophagus demonstrate no significant findings. Lungs/Pleura: There is evidence of extensive paraseptal and central lobular emphysematous lung disease. Predominantly stable, marked severity lingular and bilateral lower lobe consolidation is noted. Areas of ground-glass appearing lung parenchyma are also seen within these regions. There is a small, stable right pleural effusion. No pneumothorax is identified. Upper Abdomen: There are stable bilateral simple renal cysts. Musculoskeletal: Multilevel degenerative changes are noted throughout the thoracic spine. Review of the MIP images confirms the above findings. IMPRESSION: 1. No evidence of pulmonary embolism. 2. 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 3. Predominantly stable, marked severity lingular and bilateral lower lobe consolidation, consistent with multifocal pneumonia. 4. Small, stable right pleural effusion. 5. Stable bilateral simple renal cysts. No follow-up imaging is recommended. This recommendation follows ACR consensus guidelines: Management of the Incidental Renal Mass on CT: A White Paper of the ACR Incidental Findings Committee. J Am Coll Radiol 2018;15:264-273. 6. Aortic atherosclerosis. Aortic Atherosclerosis (ICD10-I70.0) and Emphysema (ICD10-J43.9). Electronically Signed   By: Aram Candela M.D.   On: 05/17/2023 23:53   DG Chest Portable 1 View Result Date: 05/16/2023  CLINICAL DATA:  COPD. EXAM: PORTABLE CHEST 1 VIEW COMPARISON:  X-ray 05/05/2023 and older. FINDINGS: Bilateral pleural effusions are seen, similar when adjusted for technique. However there is increasing opacity at the right lung base. Slightly increased as well at the left lung base. No pneumothorax. Stable cardiopericardial silhouette. Interstitial prominence. Film is under penetrated. Stable cardiac silhouette. IMPRESSION:  Bilateral pleural effusions identified with some adjacent opacities. Recommend continued follow-up. Under penetrated radiograph Electronically Signed   By: Karen Kays M.D.   On: 05/16/2023 17:29   DG Chest 2 View Result Date: 05/05/2023 CLINICAL DATA:  Chest pain and dyspnea EXAM: CHEST - 2 VIEW COMPARISON:  05/02/2023 chest radiograph. FINDINGS: Stable cardiomediastinal silhouette with normal heart size. No pneumothorax. Hyperinflated lungs. Small bilateral pleural effusions are unchanged. Prominent patchy opacity throughout the left lung, slightly improved. No change in streaky patchy right lung base opacity. IMPRESSION: 1. Prominent patchy opacity throughout the left lung, slightly improved, and stable streaky patchy right lung base opacity, compatible with slightly improving multilobar pneumonia. Continued chest radiograph follow-up advised to document resolution. 2. Unchanged small bilateral pleural effusions. 3. Hyperinflated lungs, suggesting COPD. Electronically Signed   By: Delbert Phenix M.D.   On: 05/05/2023 21:20   DG Chest 2 View Result Date: 05/02/2023 CLINICAL DATA:  Cough. EXAM: CHEST - 2 VIEW COMPARISON:  April 28, 2023. FINDINGS: Stable cardiomediastinal silhouette. Bibasilar opacities are again noted which may represent scarring, but acute superimposed edema or inflammation cannot be excluded. Small pleural effusions are noted. Bony thorax is unremarkable. IMPRESSION: Stable bibasilar opacities as noted above. Electronically Signed   By: Lupita Raider M.D.   On: 05/02/2023 18:43   CT Angio Chest PE W and/or Wo Contrast Result Date: 04/28/2023 CLINICAL DATA:  PE suspected. Shortness of breath with exertion. Headache. Low oxygen saturations. EXAM: CT ANGIOGRAPHY CHEST WITH CONTRAST TECHNIQUE: Multidetector CT imaging of the chest was performed using the standard protocol during bolus administration of intravenous contrast. Multiplanar CT image reconstructions and MIPs were obtained to  evaluate the vascular anatomy. RADIATION DOSE REDUCTION: This exam was performed according to the departmental dose-optimization program which includes automated exposure control, adjustment of the mA and/or kV according to patient size and/or use of iterative reconstruction technique. CONTRAST:  75mL OMNIPAQUE IOHEXOL 350 MG/ML SOLN COMPARISON:  Same day chest radiograph and CT chest 04/06/2023 FINDINGS: Cardiovascular: Negative for acute pulmonary embolism. Coronary artery and aortic atherosclerotic calcification. No pericardial effusion. Mediastinum/Nodes: Trachea is patent. There is layering debris in the left mainstem bronchus. Unremarkable esophagus. No thoracic adenopathy. Lungs/Pleura: Centrilobular and paraseptal emphysema greatest in the upper lobes. Consolidative and ground-glass opacities in the lingula and left lower lobe are similar to prior. Increased consolidation and ground-glass opacities in the right lower lobe compared with 04/06/2023. Increased small right pleural effusion and similar small left pleural effusion. Bronchial wall thickening greatest in the lower lungs. No pneumothorax. Upper Abdomen: No acute abnormality. Musculoskeletal: No acute fracture. Chronic left lateral seventh and eighth rib fractures. Thoracic spondylosis with fusion of anterior osteophytes. Review of the MIP images confirms the above findings. IMPRESSION: 1. Negative for acute pulmonary embolism. 2. Increased consolidation and ground-glass opacities in the right lower lobe compared with 04/06/2023. Findings favor worsening pneumonia. 3. Similar consolidative and ground-glass opacities in the lingula and left lower lobe. Differential considerations include pneumonia, radiation fibrosis, or malignancy given history of lung cancer. 4. Increased small right pleural effusion and similar small left pleural effusion. 5. Debris in the left mainstem bronchus.  Query aspiration. Electronically Signed   By: Minerva Fester M.D.    On: 04/28/2023 21:22   DG Chest Portable 1 View Result Date: 04/28/2023 CLINICAL DATA:  Shortness of breath.  Headache. EXAM: PORTABLE CHEST 1 VIEW COMPARISON:  Chest radiograph dated 04/24/2023 and CT dated 04/06/2023 FINDINGS: Background of emphysema and chronic interstitial coarsening. Bibasilar airspace densities similar to prior radiograph and CT. There is a small right pleural effusion. No pneumothorax. Stable cardiac silhouette no acute osseous pathology. IMPRESSION: No interval change since the prior radiograph. Electronically Signed   By: Elgie Collard M.D.   On: 04/28/2023 18:36   CT Head Wo Contrast Result Date: 04/27/2023 CLINICAL DATA:  Left-sided headaches for weeks, initial encounter EXAM: CT HEAD WITHOUT CONTRAST TECHNIQUE: Contiguous axial images were obtained from the base of the skull through the vertex without intravenous contrast. RADIATION DOSE REDUCTION: This exam was performed according to the departmental dose-optimization program which includes automated exposure control, adjustment of the mA and/or kV according to patient size and/or use of iterative reconstruction technique. COMPARISON:  10/12/2021 FINDINGS: Brain: No evidence of acute infarction, hemorrhage, hydrocephalus, extra-axial collection or mass lesion/mass effect. Chronic atrophic changes and white matter ischemic changes noted. Vascular: No hyperdense vessel or unexpected calcification. Skull: Normal. Negative for fracture or focal lesion. Sinuses/Orbits: No acute finding. Other: None. IMPRESSION: Chronic atrophic and ischemic changes.  No acute abnormality noted. Electronically Signed   By: Alcide Clever M.D.   On: 04/27/2023 02:57   DG Chest Portable 1 View Result Date: 04/24/2023 CLINICAL DATA:  Shortness of breath EXAM: PORTABLE CHEST 1 VIEW COMPARISON:  Chest x-ray 04/12/2023.  CT chest 04/06/2023. FINDINGS: There small bilateral pleural effusions. There are patchy airspace opacities in the bilateral lower  lungs, increasing on the right. The cardiomediastinal silhouette is stable. There is no pneumothorax or acute fracture. IMPRESSION: 1. Small bilateral pleural effusions. 2. Patchy airspace opacities in the bilateral lower lungs, increasing on the right. Electronically Signed   By: Darliss Cheney M.D.   On: 04/24/2023 21:46   ADDENDUM: Hematology/Oncology Attending: The patient is seen today.  I reviewed his record, lab, scans and recommended his care plan.  I agree with the above note.  This is a 73 years old white male who was initially diagnosed with a stage IIb (T3, N0, M0) non-small cell lung cancer, adenocarcinoma presented with left lower lobe lung mass in June 2022.  He was treated with SBRT to the left lower lobe lung nodule completed on October 15, 2020 and has been on observation.  In April 2023 he was found to have suspicious pleural-based nodules in addition to small left pleural effusion.  He underwent ultrasound-guided thoracentesis at that time and that showed malignant cells consistent with adenocarcinoma of lung primary.  Molecular studies performed at that time showed positive KRAS G12C mutation and PD-L1 expression of 40%.  I recommended for the patient systemic treatment at that time but also give him the option of palliative care and hospice.  He was not interested in any systemic therapy.  Unfortunately he was lost to follow-up for for close to 2 years now.  He had frequent admission to the hospital with shortness of breath and some psychiatric issues. I had a lengthy discussion with the patient today about his current condition and treatment options.  I again gave the patient the option of palliative systemic chemoimmunotherapy with carboplatin, Alimta and Keytruda every 3 weeks versus palliative care and hospice referral.  The patient is interested in  treatment at this time. I will arrange for him a follow-up appointment with me at the cancer center after discharge for more detailed  discussion of this treatment options and also to start his premedications for the treatment and chemotherapy education class. The patient has some transportation issues and he may benefit from meeting with the case manager or social worker to arrange for his transportation to the cancer center for treatment. Once the patient is discharged from the hospital, we will arrange his follow-up visit within 1 week to discuss his treatment in details. Thank you for taking good care of Mr. Gunner.  Please call if you have any questions. Disclaimer: This note was dictated with voice recognition software. Similar sounding words can inadvertently be transcribed and may be missed upon review. Lajuana Matte, MD

## 2023-05-22 NOTE — TOC Progression Note (Signed)
 Transition of Care [facility]) - Progression Note    Patient Details  Name: Mario [name] MRN: [id number] Date of Birth: 03/17/1950  [facility] CM/SW Contact  Otelia Santee, LCSW Phone Number: 05/22/2023, 3:30 PM  Clinical Narrative:    Met with pt to provide resources for transportation as it is noted that this has been a barrier for him to get to cancer center appointments. Pt accepted resources however, says he does not need them as he has his own car. He shares that he can have his roommate drive him to appointments. He states it is "a pain" to get to appointments because of his SOB but, reports having transportation.    Expected Discharge Plan: Home w Home Health Services Barriers to Discharge: No Barriers Identified  Expected Discharge Plan and Services In-house Referral: NA Discharge Planning Services: NA Post Acute Care Choice: Skilled Nursing Facility, Nursing Home Living arrangements for the past 2 months: Boarding House                                       Social Determinants of Health (SDOH) Interventions SDOH Screenings   Food Insecurity: No Food Insecurity (05/19/2023)  Housing: Low Risk  (05/19/2023)  Transportation Needs: No Transportation Needs (05/19/2023)  Utilities: Not At Risk (05/19/2023)  Financial Resource Strain: Low Risk  (09/23/2020)  Social Connections: Socially Isolated (05/20/2023)  Stress: No Stress Concern Present (09/23/2020)  Tobacco Use: Medium Risk (05/18/2023)    Readmission Risk Interventions    05/06/2023    3:25 PM 04/24/2023   10:09 AM 03/25/2023    2:23 PM  Readmission Risk Prevention Plan  Transportation Screening Complete Complete Complete  Medication Review Oceanographer) Complete Complete Complete  PCP or Specialist appointment within 3-5 days of discharge Complete    HRI or Home Care Consult Complete Complete Complete  SW Recovery Care/Counseling Consult Complete Complete Complete  Palliative Care  Screening Not Applicable Not Applicable Not Applicable  Skilled Nursing Facility Not Applicable Not Applicable Not Applicable

## 2023-05-22 NOTE — Progress Notes (Signed)
   05/22/23 2134  BiPAP/CPAP/SIPAP  BiPAP/CPAP/SIPAP Pt Type Adult  Reason BIPAP/CPAP not in use Non-compliant (Patient continues to refuse CPAP qhs.  Patient encouraged to contact RT should he change his mind.)

## 2023-05-22 NOTE — Discharge Instructions (Signed)
 [facility] [employer] [address] [place], [place] [address] [phone]  [employer] Reservations Line: [phone]     Passengers can simply call the [employer] reservations number at [phone] for pickup. However, with [employer] same-day service is available with at least two hour notice Monday through Friday. You will need your [employer] [place] client ID# when you call for reservations. If you do not know it, you can call [phone] to request it. [employer] offers a flat fare of $8.50 per trip. This will cover travel anywhere within the city limits of [place].

## 2023-05-22 NOTE — Progress Notes (Signed)
   05/22/23 1649  PT Visit Information  Last PT Received On 05/22/23  Assistance Needed +1  Reason Eval/Treat Not Completed Medical issues which prohibited therapy  History of Present Illness Pt is a 72 y.o. male presenting to therapy following hospital admission on 05/17/2023 with increased shortness of breath with productive cough, LBP and B LE weakness. Pt has had multiple recent admissions due to acute restpiraotry failure. Pt is chronically hypoxic and on 6 L/min supplemental O2, imaging reveals PNA with COPD changes. Pt PMH Includes but is not limited to: non-small cell lung cancer status post SBRT, vasculitis and ankylosing spondylitis, PE on Eliquis, OSA with CPAP intolerance, type 2 diabetes mellitus, chronic pain, depression, anxiety, and COPD with chronic hypoxic respiratory failure.   Followed up for stair assessment with pt. Found to be orthostatic and symptomatic with standing, returns to sitting before BP cycle completes but yields the following:  Sitting: 105/60, 101 bpm, 95% O2 Standing: 82/53, 106 bpm, 91% O2 (low HR 38 for 1 second, the recovers )  Madaline Guthrie, PT Acute Rehabilitation Services Office: [phone] 05/22/2023

## 2023-05-22 NOTE — Progress Notes (Signed)
 PROGRESS NOTE    Mario [name]  JJK:[id number] DOB: 02-Oct-1950 DOA: 05/17/2023 PCP: [name]    Brief Narrative: 73 year old male with multiple comorbidities end-stage COPD on 4 to 6 L of oxygen at home, multiple hospital admissions for hypoxic respiratory failure, recent RSV treated with multiple courses of antibiotics and steroids.  Patient was on a DOAC at home which was stopped due to hemoptysis and epistaxis.  He has stage IV lung cancer followed by [facility] oncology.  He had a bronch on 04/17/2023 showed focal area of granulation tissue that was biopsied which came back positive for adenocarcinoma.   He came to the hospital with complaints of anxiety hypoxia dyspnea on exertion with any minimal activity he is even afraid afraid to walk take a shower or make coffee as it drops his saturation drastically and most of the time he is alone at home though he has a roommate. [facility].  If that is an option. Admission CT of the neck CT head and chest reviewed rest of the labs reviewed  Assessment & Plan:   Principal Problem:   Multifocal pneumonia Active Problems:   DM2 (diabetes mellitus, type 2) (HCC)   COPD (chronic obstructive pulmonary disease) (HCC)   Non-small cell cancer of 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  Respiratory virus panel is negative.  Procalcitonin has been less than 0.10.  Seen by palliative.  Though [facility] he refuses hospice.  He does not want to go home as he  feels he cannot take care of himself.  His oxygen drops with any activities.  His roommate is not able to care for him.  He has had 5 admissions this year in the last 2-1/2 months.  He was suicidal a month ago.  He wants someone to take care of him.  At the same time he does not want to sign up for hospice.  PCCM saw him last admission and [facility] as there was nothing they could offer him here.  He has  agreed to DNR/DNI. Palliative have started him on morphine for dyspnea continue. Appreciate oncology input  #2 Constipation MiraLAX Colace and senna Adding lactulose and Dulcolax Smog enema patient refused yesterday he is agreeable to take it today  #3 hypertension -continue Cardizem and hydralazine.    #4 type 2 diabetes last A1c 6.7 he is requesting a regular diet continue carb modified.  #5 sleep apnea on BiPAP  #6 anemia of chronic disease stable hemoglobin  #7 chronic pain continue gabapentin and Trileptal  #8 mood disorder on Cymbalta Wellbutrin and BuSpar  #9 BPH on tamsulosin  #10 history of pulmonary embolism he was on Eliquis during his admission in February 2025 this was stopped during that time due to ongoing hemoptysis followed by he developed epistaxis ENT had to be consulted and nasal pack placed.  Eliquis was stopped indefinitely at that point.   Estimated body mass index is  38.5 kg/m as calculated from the following:   Height as of this encounter: 6\' 2"  (1.88 m).   Weight as of this encounter: 136 kg.  DVT prophylaxis: Lovenox  code Status: DNR  family Communication: None Disposition Plan:  Status is: Medically stable for discharge    Consultants:  None  Procedures: None Antimicrobials: None  Subjective: Sitting up by the side of bed refused to take small enema yesterday still not had a bowel movement he has been in the hospital 4 days on multiple stool softeners and laxatives objective: Vitals:   05/21/23 2125 05/21/23 2245  05/22/23 0100 05/22/23 0429  BP:  (!) 147/80 108/61 129/64  Pulse:   82 80  Resp: 14   20  Temp:    97.6 F (36.4 C)  TempSrc:    Oral  SpO2:   93% 96%  Weight:      Height:        Intake/Output Summary (Last 24 hours) at 05/22/2023 1027 Last data filed at 05/21/2023 1949 Gross per 24 hour  Intake 240 ml  Output 500 ml  Net -260 ml   Filed Weights   05/17/23 1944 05/20/23 1037  Weight: 136 kg 136 kg    Examination:  General exam: Appears in no acute distress Respiratory system: Scattered rhonchi to auscultation. Respiratory effort normal. Cardiovascular system: Regular Gastrointestinal system: Abdomen is distended, soft and nontender. No organomegaly or masses felt. Normal bowel sounds heard. Central nervous system: Alert and oriented. No focal neurological deficits. Extremities: Trace edema  Data Reviewed: I have personally reviewed following labs and imaging studies  CBC: Recent Labs  Lab 05/16/23 1529 05/17/23 2012 05/18/23 0137 05/19/23 0520  WBC 13.9* 13.2* 13.6* 10.0  NEUTROABS 11.1* 10.0*  --   --   HGB 12.4* 12.6* 12.2* 11.7*  HCT 38.2* 38.5* 38.7* 36.8*  MCV 87.6 87.5 89.2 89.8  PLT 392 434* 403* 359   Basic Metabolic Panel: Recent Labs  Lab 05/16/23 1529 05/17/23 2012 05/18/23 0137 05/18/23 0328 05/20/23 0558  NA 130* 134*  --  133* 136  K 4.2 4.4  --  4.1 4.3  CL 95* 101  --  97* 98  CO2 27 24  --  25 30  GLUCOSE 116* 177*  --  166* 114*  BUN 12 14  --  19 13  CREATININE 0.75 0.80 1.07 1.18 0.89  CALCIUM 8.6* 8.7*  --  8.7* 8.7*   GFR: Estimated Creatinine Clearance: 111.7 mL/min (by C-G formula based on SCr of 0.89 mg/dL). Liver Function Tests: Recent Labs  Lab 05/16/23 1529 05/17/23 2012  AST 14* 25  ALT 17 17  ALKPHOS 85 89  BILITOT 0.5 0.6  PROT 6.8 7.6  ALBUMIN 3.3* 3.7   No results for input(s): "LIPASE", "AMYLASE" in the last 168 hours. No results for input(s): "AMMONIA" in the last 168 hours. Coagulation Profile: No  results for input(s): "INR", "PROTIME" in the last 168 hours. Cardiac Enzymes: No results for input(s): "CKTOTAL", "CKMB", "CKMBINDEX", "TROPONINI" in the last 168 hours. BNP (last 3 results) No results for input(s): "PROBNP" in the last 8760 hours. HbA1C: No results for input(s): "HGBA1C" in the last 72 hours. CBG: Recent Labs  Lab 05/21/23 0726 05/21/23 1130 05/21/23 1709 05/21/23 2119 05/22/23 0751  GLUCAP 122* 119* 110* 122* 140*   Lipid Profile: No results for input(s): "CHOL", "HDL", "LDLCALC", "TRIG", "CHOLHDL", "LDLDIRECT" in the last 72 hours. Thyroid Function Tests: No results for input(s): "TSH", "T4TOTAL", "FREET4", "T3FREE", "THYROIDAB" in  the last 72 hours.  Anemia Panel: No results for input(s): "VITAMINB12", "FOLATE", "FERRITIN", "TIBC", "IRON", "RETICCTPCT" in the last 72 hours.  Sepsis Labs: No results for input(s): "PROCALCITON", "LATICACIDVEN" in the last 168 hours.  Recent Results (from the past 240 hours)  Resp panel by RT-PCR (RSV, Flu A&B, Covid) Anterior Nasal Swab     Status: None   Collection Time: 05/16/23  3:31 PM   Specimen: Anterior Nasal Swab  Result Value Ref Range Status   SARS Coronavirus 2 by RT PCR NEGATIVE NEGATIVE Final    Comment: (NOTE) SARS-CoV-2 target nucleic acids are NOT DETECTED.  The SARS-CoV-2 RNA is generally detectable in upper respiratory specimens during the acute phase of infection. The lowest concentration of SARS-CoV-2 viral copies this assay can detect is 138 copies/mL. A negative result does not preclude SARS-Cov-2 infection and should not be used as the sole basis for treatment or other patient management decisions. A negative result may occur with  improper specimen collection/handling, submission of specimen other than nasopharyngeal swab, presence of viral mutation(s) within the areas targeted by this assay, and inadequate number of viral copies(<138 copies/mL). A negative result must be combined  with clinical observations, patient history, and epidemiological information. The expected result is Negative.  Fact Sheet for Patients:  BloggerCourse.com  Fact Sheet for Healthcare Providers:  SeriousBroker.it  This test is no t yet approved or cleared by the [place] FDA and  has been authorized for detection and/or diagnosis of SARS-CoV-2 by FDA under an Emergency Use Authorization (EUA). This EUA will remain  in effect (meaning this test can be used) for the duration of the COVID-19 declaration under Section 564(b)(1) of the Act, 21 U.S.C.section 360bbb-3(b)(1), unless the authorization is terminated  or revoked sooner.       Influenza A by PCR NEGATIVE NEGATIVE Final   Influenza B by PCR NEGATIVE NEGATIVE Final    Comment: (NOTE) The Xpert Xpress SARS-CoV-2/FLU/RSV plus assay is intended as an aid in the diagnosis of influenza from Nasopharyngeal swab specimens and should not be used as a sole basis for treatment. Nasal washings and aspirates are unacceptable for Xpert Xpress SARS-CoV-2/FLU/RSV testing.  Fact Sheet for Patients: BloggerCourse.com  Fact Sheet for Healthcare Providers: SeriousBroker.it  This test is not yet approved or cleared by the [place] FDA and has been authorized for detection and/or diagnosis of SARS-CoV-2 by FDA under an Emergency Use Authorization (EUA). This EUA will remain in effect (meaning this test can be used) for the duration of the COVID-19 declaration under Section 564(b)(1) of the Act, 21 U.S.C. section 360bbb-3(b)(1), unless the authorization is terminated or revoked.     Resp Syncytial Virus by PCR NEGATIVE NEGATIVE Final    Comment: (NOTE) Fact Sheet for Patients: BloggerCourse.com  Fact Sheet for Healthcare Providers: SeriousBroker.it  This test is not yet approved  or cleared by the [place] FDA and has been authorized for detection and/or diagnosis of SARS-CoV-2 by FDA under an Emergency Use Authorization (EUA). This EUA will remain in effect (meaning this test can be used) for the duration of the COVID-19 declaration under Section 564(b)(1) of the Act, 21 U.S.C. section 360bbb-3(b)(1), unless the authorization is terminated or revoked.  Performed at [facility], [address], [place], [place] [address]   Resp panel by RT-PCR (RSV, Flu A&B, Covid) Anterior Nasal Swab     Status: None   Collection Time: 05/17/23  8:12 PM   Specimen: Anterior Nasal Swab  Result Value  Ref Range Status   SARS Coronavirus 2 by RT PCR NEGATIVE NEGATIVE Final    Comment: (NOTE) SARS-CoV-2 target nucleic acids are NOT DETECTED.  The SARS-CoV-2 RNA is generally detectable in upper respiratory specimens during the acute phase of infection. The lowest concentration of SARS-CoV-2 viral copies this assay can detect is 138 copies/mL. A negative result does not preclude SARS-Cov-2 infection and should not be used as the sole basis for treatment or other patient management decisions. A negative result may occur with  improper specimen collection/handling, submission of specimen other than nasopharyngeal swab, presence of viral mutation(s) within the areas targeted by this assay, and inadequate number of viral copies(<138 copies/mL). A negative result must be combined with clinical observations, patient history, and epidemiological information. The expected result is Negative.  Fact Sheet for Patients:  BloggerCourse.com  Fact Sheet for Healthcare Providers:  SeriousBroker.it  This test is no t yet approved or cleared by the [place] FDA and  has been authorized for detection and/or diagnosis of SARS-CoV-2 by FDA under an Emergency Use Authorization (EUA). This EUA will remain  in effect  (meaning this test can be used) for the duration of the COVID-19 declaration under Section 564(b)(1) of the Act, 21 U.S.C.section 360bbb-3(b)(1), unless the authorization is terminated  or revoked sooner.       Influenza A by PCR NEGATIVE NEGATIVE Final   Influenza B by PCR NEGATIVE NEGATIVE Final    Comment: (NOTE) The Xpert Xpress SARS-CoV-2/FLU/RSV plus assay is intended as an aid in the diagnosis of influenza from Nasopharyngeal swab specimens and should not be used as a sole basis for treatment. Nasal washings and aspirates are unacceptable for Xpert Xpress SARS-CoV-2/FLU/RSV testing.  Fact Sheet for Patients: BloggerCourse.com  Fact Sheet for Healthcare Providers: SeriousBroker.it  This test is not yet approved or cleared by the [place] FDA and has been authorized for detection and/or diagnosis of SARS-CoV-2 by FDA under an Emergency Use Authorization (EUA). This EUA will remain in effect (meaning this test can be used) for the duration of the COVID-19 declaration under Section 564(b)(1) of the Act, 21 U.S.C. section 360bbb-3(b)(1), unless the authorization is terminated or revoked.     Resp Syncytial Virus by PCR NEGATIVE NEGATIVE Final    Comment: (NOTE) Fact Sheet for Patients: BloggerCourse.com  Fact Sheet for Healthcare Providers: SeriousBroker.it  This test is not yet approved or cleared by the [place] FDA and has been authorized for detection and/or diagnosis of SARS-CoV-2 by FDA under an Emergency Use Authorization (EUA). This EUA will remain in effect (meaning this test can be used) for the duration of the COVID-19 declaration under Section 564(b)(1) of the Act, 21 U.S.C. section 360bbb-3(b)(1), unless the authorization is terminated or revoked.  Performed at [facility], [address] [place], [place] [address]    Respiratory (~20 pathogens) panel by PCR     Status: None   Collection Time: 05/18/23  1:37 AM   Specimen: Nasopharyngeal Swab; Respiratory  Result Value Ref Range Status   Adenovirus NOT DETECTED NOT DETECTED Final   Coronavirus 229E NOT DETECTED NOT DETECTED Final    Comment: (NOTE) The Coronavirus on the Respiratory Panel, DOES NOT test for the novel  Coronavirus (2019 nCoV)    Coronavirus HKU1 NOT DETECTED NOT DETECTED Final   Coronavirus NL63 NOT DETECTED NOT DETECTED Final   Coronavirus OC43 NOT DETECTED NOT DETECTED Final   Metapneumovirus NOT DETECTED NOT DETECTED Final   Rhinovirus / Enterovirus NOT DETECTED  NOT DETECTED Final   Influenza A NOT DETECTED NOT DETECTED Final   Influenza B NOT DETECTED NOT DETECTED Final   Parainfluenza Virus 1 NOT DETECTED NOT DETECTED Final   Parainfluenza Virus 2 NOT DETECTED NOT DETECTED Final   Parainfluenza Virus 3 NOT DETECTED NOT DETECTED Final   Parainfluenza Virus 4 NOT DETECTED NOT DETECTED Final   Respiratory Syncytial Virus NOT DETECTED NOT DETECTED Final   Bordetella pertussis NOT DETECTED NOT DETECTED Final   Bordetella Parapertussis NOT DETECTED NOT DETECTED Final   Chlamydophila pneumoniae NOT DETECTED NOT DETECTED Final   Mycoplasma pneumoniae NOT DETECTED NOT DETECTED Final    Comment: Performed at [facility], [address] [place], [place] [address]         Radiology Studies: No results found.   Scheduled Meds:  acetaminophen  650 mg Oral TID   arformoterol  15 mcg Nebulization BID   And   umeclidinium bromide  1 puff Inhalation Daily   bisacodyl  10 mg Oral Daily   buPROPion  300 mg Oral q morning   busPIRone  15 mg Oral q AM   cholecalciferol  1,000 Units Oral Daily   cyclobenzaprine  20 mg Oral QHS   cycloSPORINE  1 drop Both Eyes Q12H   diltiazem  180 mg Oral Daily   docusate sodium  200 mg Oral BID   DULoxetine  60 mg Oral BID   enoxaparin (LOVENOX) injection  65 mg Subcutaneous Q24H    gabapentin  100 mg Oral TID   hydrALAZINE  25 mg Oral BID   insulin aspart  0-9 Units Subcutaneous TID WC   melatonin  6 mg Oral QHS   nystatin   Topical TID   Oxcarbazepine  600 mg Oral TID   polyethylene glycol  17 g Oral Daily   senna  2 tablet Oral BID   SMOG  300 mL Rectal Once   tamsulosin  0.8 mg Oral Daily   traZODone  75 mg Oral QHS   Continuous Infusions:   LOS: 4 days    Time spent: 38 min  Alwyn Ren, MD  05/22/2023, 10:27 AM

## 2023-05-22 NOTE — Progress Notes (Signed)
-   [facility] Liaison Note:  We have received a referral for OP Palliative care services for Mr. Storlie but have not opened services yet.   Hospital liaisons will follow.   Please call for any outpatient based palliative care related questions or concerns. Thank you,  Glenna Fellows, BSN, RN, OCN [facility] Facility Liaison [phone]

## 2023-05-22 NOTE — Plan of Care (Addendum)
 VSS. BG 122 at bedtime. Patient given PRN Zofran and one time dose of Tums. No acute events overnight.  Problem: Fluid Volume: Goal: Ability to maintain a balanced intake and output will improve Outcome: Progressing   Problem: Metabolic: Goal: Ability to maintain appropriate glucose levels will improve Outcome: Progressing   Problem: Nutritional: Goal: Maintenance of adequate nutrition will improve Outcome: Progressing   Problem: Education: Goal: Knowledge of General Education information will improve Description: Including pain rating scale, medication(s)/side effects and non-pharmacologic comfort measures Outcome: Progressing   Problem: Clinical Measurements: Goal: Ability to maintain clinical measurements within normal limits will improve Outcome: Progressing Goal: Will remain free from infection Outcome: Progressing Goal: Respiratory complications will improve Outcome: Progressing Goal: Cardiovascular complication will be avoided Outcome: Progressing   Problem: Activity: Goal: Risk for activity intolerance will decrease Outcome: Progressing   Problem: Pain Managment: Goal: General experience of comfort will improve and/or be controlled Outcome: Progressing   Problem: Safety: Goal: Ability to remain free from injury will improve Outcome: Progressing   Problem: Respiratory: Goal: Ability to maintain adequate ventilation will improve Outcome: Progressing Goal: Ability to maintain a clear airway will improve Outcome: Progressing

## 2023-05-23 DIAGNOSIS — J189 Pneumonia, unspecified organism: Secondary | ICD-10-CM | POA: Diagnosis not present

## 2023-05-23 LAB — GLUCOSE, CAPILLARY
Glucose-Capillary: 117 mg/dL — ABNORMAL HIGH (ref 70–99)
Glucose-Capillary: 101 mg/dL — ABNORMAL HIGH (ref 70–99)
Glucose-Capillary: 79 mg/dL (ref 70–99)
Glucose-Capillary: 157 mg/dL — ABNORMAL HIGH (ref 70–99)

## 2023-05-23 MED ORDER — SENNA 8.6 MG PO TABS: 2.0000 | ORAL_TABLET | Freq: Two times a day (BID) | ORAL | 0 refills | Status: DC

## 2023-05-23 MED ORDER — DILTIAZEM HCL ER COATED BEADS 120 MG PO CP24
120.0000 mg | ORAL_CAPSULE | Freq: Every day | ORAL | Status: DC
  Administered 2023-05-24 – 2023-05-25 (×2): 120 mg via ORAL
  Filled 2023-05-23 (×2): qty 1

## 2023-05-23 MED ORDER — ONDANSETRON 4 MG PO TBDP: 4.0000 mg | ORAL_TABLET | Freq: Three times a day (TID) | ORAL | 0 refills | Status: DC | PRN

## 2023-05-23 NOTE — Plan of Care (Signed)
  Problem: Clinical Measurements: Goal: Diagnostic test results will improve Outcome: Progressing Goal: Respiratory complications will improve Outcome: Progressing Goal: Cardiovascular complication will be avoided Outcome: Progressing   Problem: Safety: Goal: Ability to remain free from injury will improve Outcome: Progressing   Problem: Activity: Goal: Ability to tolerate increased activity will improve Outcome: Progressing   Problem: Clinical Measurements: Goal: Ability to maintain a body temperature in the normal range will improve Outcome: Progressing

## 2023-05-23 NOTE — Progress Notes (Signed)
 Okay to dc telemetry per Dr. Jerolyn Center.

## 2023-05-23 NOTE — Progress Notes (Signed)
 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  Patient was on a DOAC at home which was stopped due to hemoptysis and epistaxis.  He has stage IV lung cancer followed by [facility] oncology.  He had a bronch on 04/17/2023 showed focal area of granulation tissue that was biopsied which came back positive for adenocarcinoma.   He came to the hospital with complaints of anxiety hypoxia dyspnea on exertion with any minimal activity he is even afraid afraid to walk take a shower or make coffee as it drops his saturation drastically and most of the time he is alone at home though he has a roommate. will [facility].  If that is an option. Admission CT of the neck CT head and chest reviewed rest of the labs reviewed  Assessment & Plan:   Principal Problem:   Multifocal pneumonia Active Problems:   DM2 (diabetes mellitus, type 2) (HCC)   COPD (chronic obstructive pulmonary disease) (HCC)   Non-small cell cancer of 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  Respiratory virus panel is negative.  Procalcitonin has been less than 0.10.  Seen by palliative.  Though [facility] he refuses hospice.  He does not want to go home as he  feels he cannot take care of himself.  His oxygen drops with any activities.  His roommate is not able to care for him.  He has had 5 admissions this year in the last 2-1/2 months.  He was suicidal a month ago.  He wants someone to take care of him.  At the same time he does not want to sign up for hospice.  PCCM saw him last admission and [facility] as there was nothing they could offer him here.  He has  agreed to DNR/DNI. Palliative have started him on morphine for dyspnea he reports the morphine makes him very sick.  Will stop the morphine.   Appreciate oncology input patient to follow-up with Dr. [name] as an outpatient to start oral chemotherapy.  #2 Constipation MiraLAX Colace and senna Adding lactulose and Dulcolax Smog enema patient refused yesterday he is agreeable to take it today  #3 hypertension -continue Cardizem and hydralazine.    #4 type 2 diabetes last A1c 6.7 he is requesting a regular diet continue carb modified.  #5 sleep apnea on BiPAP  #6 anemia of chronic disease stable hemoglobin  #7 chronic pain continue gabapentin and Trileptal  #8 mood disorder on Cymbalta Wellbutrin and BuSpar  #9 BPH on tamsulosin  #10 history of pulmonary embolism he was on Eliquis during his admission in February 2025 this was stopped during that time due to ongoing hemoptysis followed by  he developed epistaxis ENT had to be consulted and nasal pack placed.  Eliquis was stopped indefinitely at that point.  #11 orthostatic positive and physical therapy worked with him on the 24th patient became orthostatic he was given a liter of fluid and hydralazine was stopped Cardizem dose was decreased.  Will follow.  Estimated body mass index is 38.5 kg/m as calculated from the following:   Height as of this encounter: 6\' 2"  (1.88 m).   Weight as of this encounter: 136 kg.  DVT prophylaxis: Lovenox  code Status: DNR  family Communication: None Disposition Plan:   Status is: Medically stable for discharge 05/24/2023   Consultants:  None  Procedures: None Antimicrobials: None  Subjective: Complains of abdominal cramping, patient received lactulose Dulcolax and smog enema for constipation. objective: Vitals:   05/23/23 0143 05/23/23 0647 05/23/23 0911 05/23/23 1346  BP: (!) 138/49 128/61  (!) 114/57  Pulse: 88 92  92  Resp: 18 20  20   Temp: 98 F (36.7 C) (!) 97.5 F (36.4 C)  97.7 F (36.5 C)  TempSrc: Oral Oral  Oral  SpO2: 100% 97% 93% 95%  Weight:      Height:        Intake/Output Summary (Last 24 hours) at 05/23/2023 1435 Last data filed at 05/23/2023 0901 Gross per 24 hour  Intake 1872.98 ml  Output 980 ml  Net 892.98 ml   Filed Weights   05/17/23 1944 05/20/23 1037  Weight: 136 kg 136 kg    Examination:  General exam: Appears in no acute distress Respiratory system: Scattered rhonchi to auscultation. Respiratory effort normal. Cardiovascular system: Regular Gastrointestinal system: Abdomen is distended, soft and nontender. No organomegaly or masses felt. Normal bowel sounds heard. Central nervous system: Alert and oriented. No focal neurological deficits. Extremities: Trace edema  Data Reviewed: I have personally reviewed following labs and imaging studies  CBC: Recent Labs  Lab 05/16/23 1529 05/17/23 2012 05/18/23 0137 05/19/23 0520  WBC 13.9* 13.2* 13.6* 10.0  NEUTROABS 11.1* 10.0*  --   --   HGB 12.4* 12.6* 12.2* 11.7*  HCT 38.2* 38.5* 38.7* 36.8*  MCV 87.6 87.5 89.2 89.8  PLT 392 434* 403* 359   Basic Metabolic Panel: Recent Labs  Lab 05/16/23 1529 05/17/23 2012 05/18/23 0137 05/18/23 0328 05/20/23 0558  NA 130* 134*  --  133* 136  K 4.2 4.4  --  4.1 4.3  CL 95* 101  --  97* 98  CO2 27 24  --  25 30  GLUCOSE 116* 177*  --  166* 114*  BUN 12 14  --  19 13  CREATININE 0.75 0.80 1.07 1.18 0.89  CALCIUM 8.6* 8.7*  --  8.7* 8.7*   GFR: Estimated Creatinine Clearance: 111.7 mL/min (by C-G formula  based on SCr of 0.89 mg/dL). Liver Function Tests: Recent Labs  Lab 05/16/23 1529 05/17/23 2012  AST 14* 25  ALT 17 17  ALKPHOS 85 89  BILITOT 0.5 0.6  PROT 6.8 7.6  ALBUMIN 3.3* 3.7   No results for input(s): "LIPASE", "AMYLASE" in the last 168 hours. No results for input(s): "AMMONIA" in the last 168 hours. Coagulation Profile: No results for input(s): "INR", "PROTIME" in the last 168 hours. Cardiac Enzymes: No results for input(s): "CKTOTAL", "CKMB", "CKMBINDEX", "TROPONINI" in the last 168 hours. BNP (last 3 results) No results for input(s): "PROBNP" in the last 8760 hours. HbA1C: No results for input(s): "HGBA1C" in the last 72 hours. CBG: Recent Labs  Lab 05/22/23  1138 05/22/23 1624 05/22/23 2202 05/23/23 0803 05/23/23 1203  GLUCAP 141* 153* 140* 117* 101*   Lipid Profile: No results for input(s): "CHOL", "HDL", "LDLCALC", "TRIG", "CHOLHDL", "LDLDIRECT" in the last 72 hours. Thyroid Function Tests: No results for input(s): "TSH", "T4TOTAL", "FREET4", "T3FREE", "THYROIDAB" in the last 72 hours.  Anemia Panel: No results for input(s): "VITAMINB12", "FOLATE", "FERRITIN", "TIBC", "IRON", "RETICCTPCT" in the last 72 hours.  Sepsis Labs: No results for input(s): "PROCALCITON", "LATICACIDVEN" in the last 168 hours.  Recent Results (from the past 240 hours)  Resp panel by RT-PCR (RSV, Flu A&B, Covid) Anterior Nasal Swab     Status: None   Collection Time: 05/16/23  3:31 PM   Specimen: Anterior Nasal Swab  Result Value Ref Range Status   SARS Coronavirus 2 by RT PCR NEGATIVE NEGATIVE Final    Comment: (NOTE) SARS-CoV-2 target nucleic acids are NOT DETECTED.  The SARS-CoV-2 RNA is generally detectable in upper respiratory specimens during the acute phase of infection. The lowest concentration of SARS-CoV-2 viral copies this assay can detect is 138 copies/mL. A negative result does not preclude SARS-Cov-2 infection and should not be used as the sole basis for  treatment or other patient management decisions. A negative result may occur with  improper specimen collection/handling, submission of specimen other than nasopharyngeal swab, presence of viral mutation(s) within the areas targeted by this assay, and inadequate number of viral copies(<138 copies/mL). A negative result must be combined with clinical observations, patient history, and epidemiological information. The expected result is Negative.  Fact Sheet for Patients:  BloggerCourse.com  Fact Sheet for Healthcare Providers:  SeriousBroker.it  This test is no t yet approved or cleared by the [place] FDA and  has been authorized for detection and/or diagnosis of SARS-CoV-2 by FDA under an Emergency Use Authorization (EUA). This EUA will remain  in effect (meaning this test can be used) for the duration of the COVID-19 declaration under Section 564(b)(1) of the Act, 21 U.S.C.section 360bbb-3(b)(1), unless the authorization is terminated  or revoked sooner.       Influenza A by PCR NEGATIVE NEGATIVE Final   Influenza B by PCR NEGATIVE NEGATIVE Final    Comment: (NOTE) The Xpert Xpress SARS-CoV-2/FLU/RSV plus assay is intended as an aid in the diagnosis of influenza from Nasopharyngeal swab specimens and should not be used as a sole basis for treatment. Nasal washings and aspirates are unacceptable for Xpert Xpress SARS-CoV-2/FLU/RSV testing.  Fact Sheet for Patients: BloggerCourse.com  Fact Sheet for Healthcare Providers: SeriousBroker.it  This test is not yet approved or cleared by the [place] FDA and has been authorized for detection and/or diagnosis of SARS-CoV-2 by FDA under an Emergency Use Authorization (EUA). This EUA will remain in effect (meaning this test can be used) for the duration of the COVID-19 declaration under Section 564(b)(1) of the Act, 21  U.S.C. section 360bbb-3(b)(1), unless the authorization is terminated or revoked.     Resp Syncytial Virus by PCR NEGATIVE NEGATIVE Final    Comment: (NOTE) Fact Sheet for Patients: BloggerCourse.com  Fact Sheet for Healthcare Providers: SeriousBroker.it  This test is not yet approved or cleared by the [place] FDA and has been authorized for detection and/or diagnosis of SARS-CoV-2 by FDA under an Emergency Use Authorization (EUA). This EUA will remain in effect (meaning this test can be used) for the duration of the COVID-19 declaration under Section 564(b)(1) of the Act, 21 U.S.C. section 360bbb-3(b)(1), unless the authorization is terminated or revoked.  Performed  at [facility], [address] [place], [place] [address]   Resp panel by RT-PCR (RSV, Flu A&B, Covid) Anterior Nasal Swab     Status: None   Collection Time: 05/17/23  8:12 PM   Specimen: Anterior Nasal Swab  Result Value Ref Range Status   SARS Coronavirus 2 by RT PCR NEGATIVE NEGATIVE Final    Comment: (NOTE) SARS-CoV-2 target nucleic acids are NOT DETECTED.  The SARS-CoV-2 RNA is generally detectable in upper respiratory specimens during the acute phase of infection. The lowest concentration of SARS-CoV-2 viral copies this assay can detect is 138 copies/mL. A negative result does not preclude SARS-Cov-2 infection and should not be used as the sole basis for treatment or other patient management decisions. A negative result may occur with  improper specimen collection/handling, submission of specimen other than nasopharyngeal swab, presence of viral mutation(s) within the areas targeted by this assay, and inadequate number of viral copies(<138 copies/mL). A negative result must be combined with clinical observations, patient history, and epidemiological information. The expected result is Negative.  Fact Sheet for Patients:   BloggerCourse.com  Fact Sheet for Healthcare Providers:  SeriousBroker.it  This test is no t yet approved or cleared by the [place] FDA and  has been authorized for detection and/or diagnosis of SARS-CoV-2 by FDA under an Emergency Use Authorization (EUA). This EUA will remain  in effect (meaning this test can be used) for the duration of the COVID-19 declaration under Section 564(b)(1) of the Act, 21 U.S.C.section 360bbb-3(b)(1), unless the authorization is terminated  or revoked sooner.       Influenza A by PCR NEGATIVE NEGATIVE Final   Influenza B by PCR NEGATIVE NEGATIVE Final    Comment: (NOTE) The Xpert Xpress SARS-CoV-2/FLU/RSV plus assay is intended as an aid in the diagnosis of influenza from Nasopharyngeal swab specimens and should not be used as a sole basis for treatment. Nasal washings and aspirates are unacceptable for Xpert Xpress SARS-CoV-2/FLU/RSV testing.  Fact Sheet for Patients: BloggerCourse.com  Fact Sheet for Healthcare Providers: SeriousBroker.it  This test is not yet approved or cleared by the [place] FDA and has been authorized for detection and/or diagnosis of SARS-CoV-2 by FDA under an Emergency Use Authorization (EUA). This EUA will remain in effect (meaning this test can be used) for the duration of the COVID-19 declaration under Section 564(b)(1) of the Act, 21 U.S.C. section 360bbb-3(b)(1), unless the authorization is terminated or revoked.     Resp Syncytial Virus by PCR NEGATIVE NEGATIVE Final    Comment: (NOTE) Fact Sheet for Patients: BloggerCourse.com  Fact Sheet for Healthcare Providers: SeriousBroker.it  This test is not yet approved or cleared by the [place] FDA and has been authorized for detection and/or diagnosis of SARS-CoV-2 by FDA under an Emergency Use  Authorization (EUA). This EUA will remain in effect (meaning this test can be used) for the duration of the COVID-19 declaration under Section 564(b)(1) of the Act, 21 U.S.C. section 360bbb-3(b)(1), unless the authorization is terminated or revoked.  Performed at [facility], [address] [place], [place] [address]   Respiratory (~20 pathogens) panel by PCR     Status: None   Collection Time: 05/18/23  1:37 AM   Specimen: Nasopharyngeal Swab; Respiratory  Result Value Ref Range Status   Adenovirus NOT DETECTED NOT DETECTED Final   Coronavirus 229E NOT DETECTED NOT DETECTED Final    Comment: (NOTE) The Coronavirus on the Respiratory Panel, DOES NOT test for the  novel  Coronavirus (2019 nCoV)    Coronavirus HKU1 NOT DETECTED NOT DETECTED Final   Coronavirus NL63 NOT DETECTED NOT DETECTED Final   Coronavirus OC43 NOT DETECTED NOT DETECTED Final   Metapneumovirus NOT DETECTED NOT DETECTED Final   Rhinovirus / Enterovirus NOT DETECTED NOT DETECTED Final   Influenza A NOT DETECTED NOT DETECTED Final   Influenza B NOT DETECTED NOT DETECTED Final   Parainfluenza Virus 1 NOT DETECTED NOT DETECTED Final   Parainfluenza Virus 2 NOT DETECTED NOT DETECTED Final   Parainfluenza Virus 3 NOT DETECTED NOT DETECTED Final   Parainfluenza Virus 4 NOT DETECTED NOT DETECTED Final   Respiratory Syncytial Virus NOT DETECTED NOT DETECTED Final   Bordetella pertussis NOT DETECTED NOT DETECTED Final   Bordetella Parapertussis NOT DETECTED NOT DETECTED Final   Chlamydophila pneumoniae NOT DETECTED NOT DETECTED Final   Mycoplasma pneumoniae NOT DETECTED NOT DETECTED Final    Comment: Performed at [facility], [address], [place], [place] [address]         Radiology Studies: No results found.   Scheduled Meds:  acetaminophen  650 mg Oral TID   arformoterol  15 mcg Nebulization BID   And   umeclidinium bromide  1 puff Inhalation Daily   bisacodyl  10 mg Oral  Daily   buPROPion  300 mg Oral q morning   busPIRone  15 mg Oral q AM   cholecalciferol  1,000 Units Oral Daily   cyclobenzaprine  20 mg Oral QHS   cycloSPORINE  1 drop Both Eyes Q12H   diltiazem  180 mg Oral Daily   docusate sodium  200 mg Oral BID   DULoxetine  60 mg Oral BID   enoxaparin (LOVENOX) injection  70 mg Subcutaneous Q24H   gabapentin  100 mg Oral TID   hydrALAZINE  25 mg Oral BID   insulin aspart  0-9 Units Subcutaneous TID WC   melatonin  6 mg Oral QHS   nystatin   Topical TID   Oxcarbazepine  600 mg Oral TID   polyethylene glycol  17 g Oral Daily   senna  2 tablet Oral BID   tamsulosin  0.8 mg Oral Daily   traZODone  75 mg Oral QHS   Continuous Infusions:   LOS: 5 days    Time spent: 38 min  Alwyn Ren, MD  05/23/2023, 2:35 PM

## 2023-05-23 NOTE — Progress Notes (Signed)
   05/23/23 2143  BiPAP/CPAP/SIPAP  BiPAP/CPAP/SIPAP Pt Type Adult  Reason BIPAP/CPAP not in use Non-compliant (Patient continues to refuse CPAP qhs.)

## 2023-05-24 ENCOUNTER — Encounter (HOSPITAL_COMMUNITY): Payer: Self-pay | Admitting: Internal Medicine

## 2023-05-24 DIAGNOSIS — Z765 Malingerer [conscious simulation]: Secondary | ICD-10-CM | POA: Diagnosis not present

## 2023-05-24 DIAGNOSIS — C3492 Malignant neoplasm of unspecified part of left bronchus or lung: Secondary | ICD-10-CM | POA: Diagnosis not present

## 2023-05-24 DIAGNOSIS — Z66 Do not resuscitate: Secondary | ICD-10-CM

## 2023-05-24 DIAGNOSIS — J9621 Acute and chronic respiratory failure with hypoxia: Secondary | ICD-10-CM | POA: Diagnosis not present

## 2023-05-24 DIAGNOSIS — N1831 Chronic kidney disease, stage 3a: Secondary | ICD-10-CM

## 2023-05-24 DIAGNOSIS — C799 Secondary malignant neoplasm of unspecified site: Secondary | ICD-10-CM | POA: Diagnosis not present

## 2023-05-24 LAB — GLUCOSE, CAPILLARY
Glucose-Capillary: 99 mg/dL (ref 70–99)
Glucose-Capillary: 95 mg/dL (ref 70–99)
Glucose-Capillary: 128 mg/dL — ABNORMAL HIGH (ref 70–99)

## 2023-05-24 MED ORDER — POLYETHYLENE GLYCOL 3350 17 G PO PACK
34.0000 g | PACK | Freq: Every day | ORAL | Status: DC
  Administered 2023-05-25: 34 g via ORAL
  Filled 2023-05-24: qty 2

## 2023-05-24 MED ORDER — LINACLOTIDE 145 MCG PO CAPS
290.0000 ug | ORAL_CAPSULE | Freq: Once | ORAL | Status: AC
  Administered 2023-05-24: 290 ug via ORAL
  Filled 2023-05-24: qty 2

## 2023-05-24 MED ORDER — SMOG ENEMA
400.0000 mL | Freq: Once | RECTAL | Status: DC
  Filled 2023-05-24: qty 960

## 2023-05-24 NOTE — Assessment & Plan Note (Signed)
 05-24-2023 non-compliant with wearing CPAP  05-25-2023 non-complaint with CPAP. Refused CPAP again last night.

## 2023-05-24 NOTE — Assessment & Plan Note (Addendum)
 05-24-2023 pt with history of malingering to stay in the hospital. Pt at times refuses to go to SNF and refuses to be discharged to home.  He often falsely testifies about his home situation, financial situation, transportation situation in order to stay in the hospital longer or to absolve himself of any responsibility in providing his own care. CM to attempt to look for SNF. Pt made statement today [facility] status. Not sure if this is accurate because at age 73, I doubt he would be employable to being with. Pt states that this [facility] will be [facility] and may possible make him eligible for 100% service connected status. I doubt any of this story is true.   05-25-2023 Pt with passive-aggressive behavior again. Yesterday, Pt complaining about how he is constipated and requesting another enema to stimulate BM. When enema ordered, pt refused enema. This AM, pt in the bathroom complaining that he is having to strain to have a BM.

## 2023-05-24 NOTE — Hospital Course (Signed)
 HPI: Mario [name] is a 73 y.o. male with medical history significant of shortness of breath as a result of COPD, lung cancer, who has had recurrent hospitalization with acute on chronic hypoxic respiratory failure secondary to right lower lobe pneumonia and COPD exacerbation, suicidal ideation, multiple medical problems.  Patient was admitted and discharged today on oxygen.  He was discharged after several attempts and as patient was going home at the parking lot apparently he got more short of breath and return to the ER.  Patient reported that he has his concentrator going only to 5 L.  On room air he is on 4 L.  When he arrived the ER his heart rate was in the 150s.  He was placed on 6 L of oxygen and then being admitted to the medical service.  Currently he is stable on 4 L at rest.  He goes to the [place] and insist that the [place] will not approve concentrator that is more than 5 L/min.  He is scared of going home.  He has had chronic respiratory failure and appears to be at his baseline earlier in the day.  We are admitting the patient for evaluation and reassess his care plan and discharge planning.    Significant Events: Admitted 05/17/2023 acute on chronic respiratory failure   Significant Labs: WBC 13.2, HgB 12.6, plt 434 Na 134, K 4.4, CO2 of 24, BUN 14, Scr 0.8, glu 177  Significant Imaging Studies: CTPA No evidence of pulmonary embolism. 2. 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 3. Predominantly stable, marked severity lingular and bilateral lower lobe consolidation, consistent with multifocal pneumonia. 4. Small, stable right pleural effusion. 5. Stable bilateral simple renal cysts. No follow-up imaging is recommended  Antibiotic Therapy: Anti-infectives (From admission, onward)    Start     Dose/Rate Route Frequency Ordered Stop   05/18/23 1400  vancomycin (VANCOREADY) IVPB 1500 mg/300 mL  Status:  Discontinued        1,500 mg 150 mL/hr over 120 Minutes  Intravenous Every 12 hours 05/18/23 0222 05/18/23 1040   05/18/23 1000  ceFEPIme (MAXIPIME) 2 g in sodium chloride 0.9 % 100 mL IVPB  Status:  Discontinued        2 g 200 mL/hr over 30 Minutes Intravenous Every 8 hours 05/18/23 0247 05/18/23 1040   05/18/23 0200  doxycycline (VIBRAMYCIN) 100 mg in sodium chloride 0.9 % 250 mL IVPB  Status:  Discontinued        100 mg 125 mL/hr over 120 Minutes Intravenous Every 12 hours 05/18/23 0128 05/18/23 1040   05/18/23 0030  ceFEPIme (MAXIPIME) 2 g in sodium chloride 0.9 % 100 mL IVPB        2 g 200 mL/hr over 30 Minutes Intravenous  Once 05/18/23 0022 05/18/23 0303   05/18/23 0030  vancomycin (VANCOREADY) IVPB 2000 mg/400 mL        2,000 mg 200 mL/hr over 120 Minutes Intravenous  Once 05/18/23 0022 05/18/23 0450       Procedures:   Consultants: Palliative care oncology

## 2023-05-24 NOTE — Assessment & Plan Note (Signed)
 05-24-2023 continue SSI.  05-25-2023 continue with SSI.

## 2023-05-24 NOTE — Progress Notes (Signed)
   05/24/23 2042  BiPAP/CPAP/SIPAP  BiPAP/CPAP/SIPAP Pt Type Adult  Reason BIPAP/CPAP not in use Non-compliant (Pt refusing cpap for the night)

## 2023-05-24 NOTE — TOC Progression Note (Signed)
 Transition of Care [facility]) - Progression Note    Patient Details  Name: Mario [name] MRN: [id number] Date of Birth: Sep 09, 1950  Transition of Care [facility]) CM/SW Contact  Otelia Santee, LCSW Phone Number: 05/24/2023, 3:42 PM  Clinical Narrative:    Pt/MD wanting to attempt SNF. Worked pt up for SNF.   Met with pt to review bed offers that came through. After discussing reason pt would be going to SNF would be for physical therapy, pt says "I can get that at home." CSW discussed pt going home vs SNF. Pt agreeable to go home. But, requests to stay the night. Pt shares he has 15 hours of home care a week and receives HHPT through [facility]. Pt will continue services at discharge.    Expected Discharge Plan: Home w Home Health Services Barriers to Discharge: No Barriers Identified  Expected Discharge Plan and Services In-house Referral: NA Discharge Planning Services: NA Post Acute Care Choice: Skilled Nursing Facility, Nursing Home Living arrangements for the past 2 months: Boarding House                                       Social Determinants of Health (SDOH) Interventions SDOH Screenings   Food Insecurity: No Food Insecurity (05/19/2023)  Housing: Low Risk  (05/19/2023)  Transportation Needs: No Transportation Needs (05/19/2023)  Utilities: Not At Risk (05/19/2023)  Financial Resource Strain: Low Risk  (09/23/2020)  Social Connections: Socially Isolated (05/20/2023)  Stress: No Stress Concern Present (09/23/2020)  Tobacco Use: Medium Risk (05/18/2023)    Readmission Risk Interventions    05/06/2023    3:25 PM 04/24/2023   10:09 AM 03/25/2023    2:23 PM  Readmission Risk Prevention Plan  Transportation Screening Complete Complete Complete  Medication Review Oceanographer) Complete Complete Complete  PCP or Specialist appointment within 3-5 days of discharge Complete    HRI or Home Care Consult Complete Complete Complete  SW Recovery Care/Counseling Consult  Complete Complete Complete  Palliative Care Screening Not Applicable Not Applicable Not Applicable  Skilled Nursing Facility Not Applicable Not Applicable Not Applicable

## 2023-05-24 NOTE — Assessment & Plan Note (Signed)
 05-24-2023 currently not exacerbated  05-25-2023 not exacerbated. No indication for systemic steroids. Pt frequently wheezes after physical exertion. This does NOT mean he has a COPD exacerbation.

## 2023-05-24 NOTE — Progress Notes (Signed)
   05/24/23 1228  PT Visit Information  Last PT Received On 05/22/23  Reason Eval/Treat Not Completed Other (comment) (Pt reports inc nausea, defers session. Will follow up.)  History of Present Illness Pt is a 73 y.o. male presenting to therapy following hospital admission on 05/17/2023 with increased shortness of breath with productive cough, LBP and B LE weakness. Pt has had multiple recent admissions due to acute restpiraotry failure. Pt is chronically hypoxic and on 6 L/min supplemental O2, imaging reveals PNA with COPD changes. Pt PMH Includes but is not limited to: non-small cell lung cancer status post SBRT, vasculitis and ankylosing spondylitis, PE on Eliquis, OSA with CPAP intolerance, type 2 diabetes mellitus, chronic pain, depression, anxiety, and COPD with chronic hypoxic respiratory failure.   Madaline Guthrie, PT Acute Rehabilitation Services Office: [phone] 05/24/2023

## 2023-05-24 NOTE — [place] FL2 (Signed)
 [place] MEDICAID FL2 LEVEL OF CARE FORM     IDENTIFICATION  Patient Name: Mario [name] Birthdate: [birth year] Sex: male Admission Date (Current Location): 05/17/2023  [facility] and [place] Number:  Producer, television/film/video and Address:  [facility],  [address] [place], [place] [address]      Provider Number: [id number]  Attending Physician Name and Address:  Carollee Herter, DO  Relative Name and Phone Number:       Current Level of Care: Hospital Recommended Level of Care: Skilled Nursing Facility Prior Approval Number:    Date Approved/Denied:   PASRR Number: pending  Discharge Plan: SNF    Current Diagnoses: Patient Active Problem List   Diagnosis Date Noted   Acute respiratory failure with hypoxia (HCC) 05/18/2023   Multifocal pneumonia 05/18/2023   Malignant neoplasm of lung (HCC) 05/06/2023   Acute hypoxemic respiratory failure (HCC) 05/05/2023   Right lower lobe pneumonia 04/28/2023   Epistaxis 04/19/2023   Hemoptysis 04/06/2023   COPD exacerbation (HCC) 04/04/2023   COPD with acute bronchitis (HCC) 04/01/2023   RSV bronchitis 03/22/2023   Community acquired pneumonia 03/17/2023   History of lung cancer 03/17/2023   Acute hypoxic respiratory failure (HCC) 03/17/2023   RSV (respiratory syncytial virus pneumonia) 03/17/2023   Ankylosing spondylitis lumbar region (HCC) 09/25/2022   Vasculitis (HCC) 07/02/2022   Chronic pain 07/02/2022   history of Pulmonary embolism (HCC) 07/02/2022   Anxiety    Acute on chronic hypoxic respiratory failure (HCC) 11/02/2021   Adjustment disorder with depressed mood 10/09/2021   BPH (benign prostatic hyperplasia) 09/23/2021   Fracture of ribs, two, left, sequela 08/24/2021   Metastatic carcinoma (HCC) 08/07/2021   Non-small cell lung cancer, left (HCC) 08/07/2021   Chronic respiratory failure with hypoxia, on home O2 therapy (HCC) - 4 L/min 08/07/2021   Rib pain 08/03/2021   History of falling 07/29/2021    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   DM2 (diabetes mellitus, type 2) (HCC) 07/16/2017   HLA B27 (HLA B27 positive) 07/16/2017   Hyperkalemia    Hypoxia    Dyspnea 05/23/2014   Malingering 01/21/2013   Tobacco abuse 01/11/2013   Obesity, unspecified 01/11/2013   Hypertension associated with diabetes (HCC) 01/10/2013    Orientation RESPIRATION BLADDER Height & Weight     Self, Situation, Time, Place  O2 (6L) Continent Weight: 299 lb 13.2 oz (136 kg) Height:  6\' 2"  (188 cm)  BEHAVIORAL SYMPTOMS/MOOD NEUROLOGICAL BOWEL NUTRITION STATUS      Continent Diet (heart healthy/carb modified)  AMBULATORY STATUS COMMUNICATION OF NEEDS  Skin   Supervision Verbally Normal                       Personal Care Assistance Level of Assistance  Bathing, Feeding, Dressing Bathing Assistance: Independent Feeding assistance: Independent Dressing Assistance: Independent     Functional Limitations Info  Sight, Hearing, Speech Sight Info: Adequate (reading glasses) Hearing Info: Adequate Speech  Info: Adequate    SPECIAL CARE FACTORS FREQUENCY  PT (By licensed PT), OT (By licensed OT)     PT Frequency: 5x/wk OT Frequency: 5x/wk            Contractures Contractures Info: Not present    Additional Factors Info  Code Status, Allergies, Psychotropic Code Status Info: DNR Allergies Info: Demerol (Meperidine), Zocor (Simvastatin), Beet (Beta Vulgaris), Liver Psychotropic Info: See MAR         Current Medications (05/24/2023):  This is the current hospital active medication list Current Facility-Administered Medications  Medication Dose Route Frequency Provider Last Rate Last Admin   acetaminophen (TYLENOL) tablet 650 mg  650 mg Oral TID Barbara Cower, NP   650 mg at 05/24/23 0956   acetaminophen (TYLENOL) tablet 650 mg  650 mg Oral Q6H PRN Alwyn Ren, MD       albuterol (PROVENTIL) (2.5 MG/3ML) 0.083% nebulizer solution 2.5 mg  2.5 mg Nebulization Q6H PRN Alwyn Ren, MD       arformoterol [facility]) nebulizer solution 15 mcg  15 mcg Nebulization BID Eduard Clos, MD   15 mcg at 05/24/23 0454   And   umeclidinium bromide (INCRUSE ELLIPTA) 62.5 MCG/ACT 1 puff  1 puff Inhalation Daily Eduard Clos, MD       azelastine (ASTELIN) 0.1 % nasal spray 1 spray  1 spray Each Nare BID PRN Alwyn Ren, MD       bisacodyl (DULCOLAX) EC tablet 10 mg  10 mg Oral Daily Alwyn Ren, MD   10 mg at 05/24/23 0956   buPROPion (WELLBUTRIN XL) 24 hr tablet 300 mg  300 mg Oral q morning Eduard Clos, MD   300 mg at 05/24/23 0956   busPIRone (BUSPAR) tablet 15 mg  15 mg Oral q AM Eduard Clos, MD   15 mg at 05/24/23 0981   cholecalciferol (VITAMIN D3) 25 MCG (1000 UNIT) tablet 1,000 Units  1,000 Units Oral Daily Alwyn Ren, MD   1,000 Units at 05/24/23 0956   cyclobenzaprine (FLEXERIL) tablet 20 mg  20 mg Oral QHS Eduard Clos, MD   20 mg at 05/23/23 2235   cycloSPORINE (RESTASIS) 0.05 % ophthalmic emulsion 1 drop   1 drop Both Eyes Q12H Alwyn Ren, MD   1 drop at 05/24/23 0957   diltiazem (CARDIZEM CD) 24 hr capsule 120 mg  120 mg Oral Daily Alwyn Ren, MD   120 mg at 05/24/23 1914   docusate sodium (COLACE) capsule 200 mg  200 mg Oral BID Eduard Clos, MD   200 mg at 05/24/23 0956   DULoxetine (CYMBALTA) DR capsule 60 mg  60 mg Oral BID Eduard Clos, MD   60 mg at 05/24/23 0956   enoxaparin (LOVENOX) injection 70 mg  70 mg Subcutaneous Q24H Alwyn Ren, MD   70 mg at 05/24/23 1001   gabapentin (NEURONTIN) capsule 100 mg  100 mg Oral TID Eduard Clos, MD   100 mg at 05/24/23 0956   insulin aspart (novoLOG) injection 0-9 Units  0-9 Units Subcutaneous TID WC Eduard Clos, MD   2 Units at 05/22/23 1631   lactulose (CHRONULAC) 10 GM/15ML solution 10 g  10 g Oral BID PRN Alwyn Ren, MD       melatonin tablet 6 mg  6 mg Oral QHS Eduard Clos, MD   6 mg at 05/23/23 2234   nystatin (MYCOSTATIN/NYSTOP) topical powder   Topical TID Alwyn Ren, MD   Given at 05/24/23 1004   ondansetron (ZOFRAN-ODT) disintegrating tablet 4 mg  4 mg Oral Q8H PRN Barbara Cower, NP   4 mg at 05/24/23 1041   Oxcarbazepine (TRILEPTAL) tablet 600 mg  600 mg Oral TID Eduard Clos, MD   600 mg at 05/24/23 0981   oxyCODONE (Oxy IR/ROXICODONE) immediate release tablet 10 mg  10 mg Oral BID PRN Alwyn Ren, MD   10 mg at 05/24/23 0959   polyethylene glycol (MIRALAX / GLYCOLAX) packet 17 g  17 g Oral Daily Alwyn Ren, MD   17 g at 05/24/23 1914   polyvinyl alcohol (LIQUIFILM TEARS) 1.4 % ophthalmic solution 1 drop  1 drop Both Eyes QID PRN Alwyn Ren, MD       senna (SENOKOT) tablet 17.2 mg  2 tablet Oral BID Barbara Cower, NP   17.2 mg at 05/24/23 0956   sodium chloride (OCEAN) 0.65 % nasal spray 1 spray  1 spray Each Nare PRN Alwyn Ren, MD   1 spray at 05/21/23 1008   tamsulosin (FLOMAX) capsule 0.8 mg  0.8 mg  Oral Daily Eduard Clos, MD   0.8 mg at 05/24/23 0956   traZODone (DESYREL) tablet 75 mg  75 mg Oral QHS Eduard Clos, MD   75 mg at 05/23/23 2235     Discharge Medications: Please see discharge summary for a list of discharge medications.  Relevant Imaging Results:  Relevant Lab Results:   Additional Information SSN: [id number]  Otelia Santee, LCSW

## 2023-05-24 NOTE — Assessment & Plan Note (Signed)
 05-24-2023 stable.

## 2023-05-24 NOTE — Assessment & Plan Note (Signed)
 Made DNR/DNI by palliative care on 05-18-2023.

## 2023-05-24 NOTE — Assessment & Plan Note (Signed)
 05-24-2023 continue cardizem-CD 120 mg daily.  05-25-2023 continue with reduced dose cardizem CD 120 mg daily. Hydralazine stopped. These changes made due to orthostatic hypotension noted during this admission.

## 2023-05-24 NOTE — Progress Notes (Signed)
 Physical Therapy Treatment Patient Details Name: Mario [name] MRN: [id number] DOB: 14-Mar-1950 Today's Date: 05/24/2023   History of Present Illness Pt is a 73 y.o. male presenting to therapy following hospital admission on 05/17/2023 with increased shortness of breath with productive cough, LBP and B LE weakness. Pt has had multiple recent admissions due to acute restpiraotry failure. Pt is chronically hypoxic and on 6 L/min supplemental O2, imaging reveals PNA with COPD changes. Pt PMH Includes but is not limited to: non-small cell lung cancer status post SBRT, vasculitis and ankylosing spondylitis, PE on Eliquis, OSA with CPAP intolerance, type 2 diabetes mellitus, chronic pain, depression, anxiety, and COPD with chronic hypoxic respiratory failure.    PT Comments  Pt able to demonstrate improved tolerance to mobility today with ind amb to the bathroom, is SOB with tasks, but able to self identify and recover well. Pt completes 3 STS from bed, reports HA inc and legs became shaky. O2 sats drop 89% <10 seconds with recovery HR 110 recovers to 90bpm. Nursing notified about HA increase. Pt educated on energy conservation and use of 2WW to allow for improved efficiency.     If plan is discharge home, recommend the following: Help with stairs or ramp for entrance;Assistance with cooking/housework;Assist for transportation   Can travel by private vehicle        Equipment Recommendations  None recommended by PT    Recommendations for Other Services       Precautions / Restrictions Precautions Precautions: Fall Recall of Precautions/Restrictions: Intact Precaution/Restrictions Comments: Monitor O2, 5L O2 baseline Restrictions Weight Bearing Restrictions Per Provider Order: No     Mobility  Bed Mobility Overal bed mobility: Modified Independent             General bed mobility comments: sitting up at edge of bed    Transfers Overall transfer level: Independent Equipment used:  None Transfers: Sit to/from Stand Sit to Stand: Independent                Ambulation/Gait                   Stairs             Wheelchair Mobility     Tilt Bed    Modified Rankin (Stroke Patients Only)       Balance Overall balance assessment: Independent Sitting-balance support: No upper extremity supported, Feet supported Sitting balance-Leahy Scale: Good     Standing balance support: No upper extremity supported Standing balance-Leahy Scale: Good                              Communication Communication Communication: No apparent difficulties  Cognition Arousal: Alert Behavior During Therapy: WFL for tasks assessed/performed   PT - Cognitive impairments: No apparent impairments                       PT - Cognition Comments: AxO x 3 pleasant.  Lives with a Roommate Following commands: Intact      Cueing    Exercises Other Exercises Other Exercises: STS completed x3 following amb to bathroom, pt becomes SOB reports an inc in HA    General Comments General comments (skin integrity, edema, etc.): 5L O2, able to complete STS and amb to bathroom with good cadence, has inc difficulty with greater distances      Pertinent Vitals/Pain Pain Assessment Pain Assessment: No/denies pain Pain Intervention(s):  Monitored during session    Home Living                          Prior Function            PT Goals (current goals can now be found in the care plan section) Acute Rehab PT Goals Patient Stated Goal: "not be short of breath" and get rehab so I can go home PT Goal Formulation: With patient Time For Goal Achievement: 06/09/23 Potential to Achieve Goals: Good Progress towards PT goals: Progressing toward goals    Frequency    Min 2X/week      PT Plan      Co-evaluation              AM-PAC PT "6 Clicks" Mobility   Outcome Measure  Help needed turning from your back to your side while  in a flat bed without using bedrails?: None Help needed moving from lying on your back to sitting on the side of a flat bed without using bedrails?: None Help needed moving to and from a bed to a chair (including a wheelchair)?: None Help needed standing up from a chair using your arms (e.g., wheelchair or bedside chair)?: None Help needed to walk in hospital room?: None Help needed climbing 3-5 steps with a railing? : A Little 6 Click Score: 23    End of Session Equipment Utilized During Treatment: Oxygen;Gait belt (5L) Activity Tolerance: Patient limited by fatigue Patient left: in bed;with call bell/phone within reach Nurse Communication: Mobility status PT Visit Diagnosis: Difficulty in walking, not elsewhere classified (R26.2);Unsteadiness on feet (R26.81);Pain Pain - Right/Left: Right Pain - part of body: Shoulder     Time: 5409-8119 PT Time Calculation (min) (ACUTE ONLY): 28 min  Charges:    $Therapeutic Activity: 8-22 mins PT General Charges $$ ACUTE PT VISIT: 1 Visit                     Madaline Guthrie, PT Acute Rehabilitation Services Office: [phone] 05/24/2023    Evelena Peat 05/24/2023, 5:34 PM

## 2023-05-24 NOTE — TOC PASRR Note (Signed)
 30 Day PASRR Note   Patient Details  Name: Mario [name] Date of Birth: 14-Jul-1950   Transition of Care [facility]) CM/SW Contact:    Otelia Santee, LCSW Phone Number: 05/24/2023, 3:09 PM  To Whom It May Concern:  Please be advised that this patient will require a short-term nursing home stay - anticipated 30 days or less for rehabilitation and strengthening.   The plan is for return home.

## 2023-05-24 NOTE — Assessment & Plan Note (Signed)
 05-24-2023 does not have a regular prescriber for his opiates.  05-25-2023 no opiate Rx will be sent home with patient. He does not have a regular provider for opiates.

## 2023-05-24 NOTE — Assessment & Plan Note (Signed)
05-24-2023  

## 2023-05-24 NOTE — Progress Notes (Signed)
 PROGRESS NOTE    Mario [name]  ION:[id number] DOB: Jul 19, 1950 DOA: 05/17/2023 PCP: [name]  Subjective: Pt seen and examined. Pt known to me from prior admissions. He is usually very resistant about being discharged from hospital. He tends to like to stay in the hospital to be cared for.  He often makes false statements regarding his situation, financial status and other life situations.  Pt states he would go to SNF if offered but only he likes the facility. He does give permission to states SNF bed search.  Pt makes statement that he is being evaluated by [facility] if he will be able to get 100% service connected.  I don't think pt ever had multifocal pneumonia. I think his radiology abnormalities are due to his metastatic lung cancer.  Pt complaining about his bowels and constipation. Pt agreed to getting another SMOG enema and more laxatives.   Hospital Course: HPI: Mario [name] is a 73 y.o. male with medical history significant of shortness of breath as a result of COPD, lung cancer, who has had recurrent hospitalization with acute on chronic hypoxic respiratory failure secondary to right lower lobe pneumonia and COPD exacerbation, suicidal ideation, multiple medical problems.  Patient was admitted and discharged today on oxygen.  He was discharged after several attempts and as patient was going home at the parking lot apparently he got more short of breath and return to the ER.  Patient reported that he has his concentrator going only to 5 L.  On room air he is on 4 L.  When he arrived the ER his heart rate was in the 150s.  He was placed on 6 L of oxygen and then being admitted to the medical service.  Currently he is stable on 4 L at rest.  He goes to the [place] and insist that the [place] will not approve concentrator that is more than 5 L/min.  He is scared of going home.  He has had chronic respiratory failure and appears to be at his baseline earlier in the day.   We are admitting the patient for evaluation and reassess his care plan and discharge planning.    Significant Events: Admitted 05/17/2023 acute on chronic respiratory failure   Significant Labs: WBC 13.2, HgB 12.6, plt 434 Na 134, K 4.4, CO2 of 24, BUN 14, Scr 0.8, glu 177  Significant Imaging Studies: CTPA No evidence of pulmonary embolism. 2. 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 3. Predominantly stable, marked severity lingular and bilateral lower lobe consolidation, consistent with multifocal pneumonia. 4. Small, stable right pleural effusion. 5. Stable bilateral simple renal cysts. No follow-up imaging is recommended  Antibiotic Therapy: Anti-infectives (From admission, onward)    Start     Dose/Rate Route Frequency Ordered Stop   05/18/23 1400  vancomycin (VANCOREADY) IVPB 1500 mg/300 mL  Status:  Discontinued        1,500 mg 150 mL/hr over 120 Minutes Intravenous Every 12 hours 05/18/23 0222 05/18/23 1040   05/18/23 1000  ceFEPIme (MAXIPIME) 2 g in sodium chloride 0.9 % 100 mL IVPB  Status:  Discontinued        2 g 200 mL/hr over 30 Minutes Intravenous Every 8 hours 05/18/23 0247 05/18/23 1040   05/18/23 0200  doxycycline (VIBRAMYCIN) 100 mg in sodium chloride 0.9 % 250 mL IVPB  Status:  Discontinued        100 mg 125 mL/hr over 120 Minutes Intravenous Every 12 hours 05/18/23 0128  05/18/23 1040   05/18/23 0030  ceFEPIme (MAXIPIME) 2 g in sodium chloride 0.9 % 100 mL IVPB        2 g 200 mL/hr over 30 Minutes Intravenous  Once 05/18/23 0022 05/18/23 0303   05/18/23 0030  vancomycin (VANCOREADY) IVPB 2000 mg/400 mL        2,000 mg 200 mL/hr over 120 Minutes Intravenous  Once 05/18/23 0022 05/18/23 0450       Procedures:   Consultants: Palliative care oncology    Assessment and Plan: * Acute on chronic respiratory failure with hypoxia (HCC) - chroniically on 5 L/min home O2. 05-17-2023 through 05-22-2024 antibiotics stopped as patient  has received multiple courses of antibiotics in the last 3 months.  Respiratory virus panel is negative.  Procalcitonin has been less than 0.10.  Seen by palliative.  [facility] he refuses hosp[facility].  He does not want to go home as he feels he cannot take care of himself.  His oxygen drops with any activities.  His roommate is not able to care for him.  He has had 5 admissions this year in the last 2-1/2 months.  He was suicidal a month ago.  He wants someone to take care of him.  At the same time he does not want to sign up for hospice.  PCCM saw him last admission and [facility] was nothing they could offer him here.  He has  agreed to DNR/DNI. Palliative have started him on morphine for dyspnea he reports the morphine makes him very sick.  Will stop the morphine.   Appreciate oncology input patient to follow-up with Dr. [name] as an outpatient to start oral chemotherapy.  05-24-2023 continue with 5 L/min home O2. CM starting to look for SNF.  Malingering 05-24-2023 pt with history of malingering to stay in the hospital. Pt at times refuses to go to SNF and refuses to be discharged to home.  He often falsely testifies about his home situation, financial situation, transportation situation in order to stay in the hospital longer or to absolve himself of any responsibility in providing his own care. CM to attempt to look for SNF. Pt made statement today that [facility] he is eligible for unemployment status. Not sure if this is accurate because at age 27, I doubt he would be employable to being with. Pt states that this [facility] will be [facility] and may possible make him eligible for 100% service connected status. I doubt any of this story is true.   Metastatic carcinoma (HCC) 05-24-2023 pt seen by oncology. They will arrange for f/u if pt goes to appointment.  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 05-24-2023 pt seen by oncology. They will arrange for f/u if pt goes to appointment.  Chronic pain 05-24-2023 does not have a regular prescriber for his opiates.  Stage 3a chronic kidney disease (HCC) 05-24-2023 stable.  COPD (chronic obstructive pulmonary disease) (HCC) 05-24-2023 currently not exacerbated  OSA (obstructive sleep apnea) 05-24-2023 non-compliant with wearing CPAP  DM2 (diabetes mellitus, type 2) (HCC) 05-24-2023 continue SSI.  Hypertension associated with diabetes (HCC) 05-24-2023 continue cardizem-CD 120 mg daily.  DNR (do not resuscitate)/DNI(Do not intubate) Made DNR/DNI by palliative care on 05-18-2023.   DVT prophylaxis:   SCDs   Code Status: Limited: Do not attempt resuscitation (DNR) -DNR-LIMITED -Do Not  Intubate/DNI  Family Communication: no family at bedside. Pt is decisional Disposition Plan: return home vs SNF Reason for continuing need for hospitalization: medically stable for DC  Objective: Vitals:   05/24/23 0501 05/24/23 0532 05/24/23 0930 05/24/23 1131  BP: (!) 163/85 (!) 148/74  (!) 167/81  Pulse: 84   94  Resp: 18     Temp: 98.1 F (36.7 C)   97.9 F (36.6 C)  TempSrc: Oral   Oral  SpO2: 96%  93% 94%  Weight:      Height:        Intake/Output Summary (Last 24 hours) at 05/24/2023 1536 Last data filed at 05/24/2023 1300 Gross per 24 hour  Intake 240 ml  Output 2045 ml  Net -1805 ml   Filed Weights   05/17/23 1944 05/20/23 1037  Weight: 136 kg 136 kg    Examination:  Physical Exam Vitals and nursing note reviewed.  Constitutional:      General: He is not in acute distress.    Appearance: He is obese. He is not toxic-appearing or diaphoretic.     Comments: Chronically ill appearing  HENT:     Head: Normocephalic and atraumatic.     Nose: Nose normal.  Cardiovascular:     Rate and Rhythm: Normal rate and regular rhythm.  Pulmonary:     Effort: Pulmonary effort is normal.  No respiratory distress.     Breath sounds: Normal breath sounds. No wheezing or rales.  Abdominal:     General: Abdomen is protuberant. Bowel sounds are normal. There is no distension.     Palpations: Abdomen is soft.  Musculoskeletal:     Right lower leg: No edema.     Left lower leg: No edema.  Skin:    General: Skin is warm and dry.     Capillary Refill: Capillary refill takes less than 2 seconds.  Neurological:     Mental Status: He is alert and oriented to person, place, and time.     Data Reviewed: I have personally reviewed following labs and imaging studies  CBC: Recent Labs  Lab 05/17/23 2012 05/18/23 0137 05/19/23 0520  WBC 13.2* 13.6* 10.0  NEUTROABS 10.0*  --   --   HGB 12.6* 12.2* 11.7*  HCT 38.5* 38.7* 36.8*  MCV 87.5 89.2 89.8  PLT 434* 403* 359   Basic Metabolic Panel: Recent Labs  Lab 05/17/23 2012 05/18/23 0137 05/18/23 0328 05/20/23 0558  NA 134*  --  133* 136  K 4.4  --  4.1 4.3  CL 101  --  97* 98  CO2 24  --  25 30  GLUCOSE 177*  --  166* 114*  BUN 14  --  19 13  CREATININE 0.80 1.07 1.18 0.89  CALCIUM 8.7*  --  8.7* 8.7*   GFR: Estimated Creatinine Clearance: 111.7 mL/min (by C-G formula based on SCr of 0.89 mg/dL). Liver Function Tests: Recent Labs  Lab 05/17/23 2012  AST 25  ALT 17  ALKPHOS 89  BILITOT 0.6  PROT 7.6  ALBUMIN 3.7   BNP (last 3 results) Recent Labs    04/28/23 1840 05/16/23 1529 05/17/23 2012  BNP 45.4 41.5 46.5   CBG: Recent Labs  Lab 05/23/23 1203 05/23/23 1730 05/23/23 2125 05/24/23 0712 05/24/23 1129  GLUCAP 101* 79 157* 99 95   Recent Results (from the past 240 hours)  Resp panel by RT-PCR (RSV, Flu A&B, Covid) Anterior Nasal Swab     Status: None   Collection  Time: 05/16/23  3:31 PM   Specimen: Anterior Nasal Swab  Result Value Ref Range Status   SARS Coronavirus 2 by RT PCR NEGATIVE NEGATIVE Final    Comment: (NOTE) SARS-CoV-2 target nucleic acids are NOT DETECTED.  The SARS-CoV-2  RNA is generally detectable in upper respiratory specimens during the acute phase of infection. The lowest concentration of SARS-CoV-2 viral copies this assay can detect is 138 copies/mL. A negative result does not preclude SARS-Cov-2 infection and should not be used as the sole basis for treatment or other patient management decisions. A negative result may occur with  improper specimen collection/handling, submission of specimen other than nasopharyngeal swab, presence of viral mutation(s) within the areas targeted by this assay, and inadequate number of viral copies(<138 copies/mL). A negative result must be combined with clinical observations, patient history, and epidemiological information. The expected result is Negative.  Fact Sheet for Patients:  BloggerCourse.com  Fact Sheet for Healthcare Providers:  SeriousBroker.it  This test is no t yet approved or cleared by the [place] FDA and  has been authorized for detection and/or diagnosis of SARS-CoV-2 by FDA under an Emergency Use Authorization (EUA). This EUA will remain  in effect (meaning this test can be used) for the duration of the COVID-19 declaration under Section 564(b)(1) of the Act, 21 U.S.C.section 360bbb-3(b)(1), unless the authorization is terminated  or revoked sooner.       Influenza A by PCR NEGATIVE NEGATIVE Final   Influenza B by PCR NEGATIVE NEGATIVE Final    Comment: (NOTE) The Xpert Xpress SARS-CoV-2/FLU/RSV plus assay is intended as an aid in the diagnosis of influenza from Nasopharyngeal swab specimens and should not be used as a sole basis for treatment. Nasal washings and aspirates are unacceptable for Xpert Xpress SARS-CoV-2/FLU/RSV testing.  Fact Sheet for Patients: BloggerCourse.com  Fact Sheet for Healthcare Providers: SeriousBroker.it  This test is not yet approved or cleared by the  [place] FDA and has been authorized for detection and/or diagnosis of SARS-CoV-2 by FDA under an Emergency Use Authorization (EUA). This EUA will remain in effect (meaning this test can be used) for the duration of the COVID-19 declaration under Section 564(b)(1) of the Act, 21 U.S.C. section 360bbb-3(b)(1), unless the authorization is terminated or revoked.     Resp Syncytial Virus by PCR NEGATIVE NEGATIVE Final    Comment: (NOTE) Fact Sheet for Patients: BloggerCourse.com  Fact Sheet for Healthcare Providers: SeriousBroker.it  This test is not yet approved or cleared by the [place] FDA and has been authorized for detection and/or diagnosis of SARS-CoV-2 by FDA under an Emergency Use Authorization (EUA). This EUA will remain in effect (meaning this test can be used) for the duration of the COVID-19 declaration under Section 564(b)(1) of the Act, 21 U.S.C. section 360bbb-3(b)(1), unless the authorization is terminated or revoked.  Performed at [facility], [address], [place], [place] [address]   Resp panel by RT-PCR (RSV, Flu A&B, Covid) Anterior Nasal Swab     Status: None   Collection Time: 05/17/23  8:12 PM   Specimen: Anterior Nasal Swab  Result Value Ref Range Status   SARS Coronavirus 2 by RT PCR NEGATIVE NEGATIVE Final    Comment: (NOTE) SARS-CoV-2 target nucleic acids are NOT DETECTED.  The SARS-CoV-2 RNA is generally detectable in upper respiratory specimens during the acute phase of infection. The lowest concentration of SARS-CoV-2 viral copies this assay can detect is 138 copies/mL. A negative result does not preclude SARS-Cov-2 infection  and should not be used as the sole basis for treatment or other patient management decisions. A negative result may occur with  improper specimen collection/handling, submission of specimen other than nasopharyngeal swab, presence of viral  mutation(s) within the areas targeted by this assay, and inadequate number of viral copies(<138 copies/mL). A negative result must be combined with clinical observations, patient history, and epidemiological information. The expected result is Negative.  Fact Sheet for Patients:  BloggerCourse.com  Fact Sheet for Healthcare Providers:  SeriousBroker.it  This test is no t yet approved or cleared by the [place] FDA and  has been authorized for detection and/or diagnosis of SARS-CoV-2 by FDA under an Emergency Use Authorization (EUA). This EUA will remain  in effect (meaning this test can be used) for the duration of the COVID-19 declaration under Section 564(b)(1) of the Act, 21 U.S.C.section 360bbb-3(b)(1), unless the authorization is terminated  or revoked sooner.       Influenza A by PCR NEGATIVE NEGATIVE Final   Influenza B by PCR NEGATIVE NEGATIVE Final    Comment: (NOTE) The Xpert Xpress SARS-CoV-2/FLU/RSV plus assay is intended as an aid in the diagnosis of influenza from Nasopharyngeal swab specimens and should not be used as a sole basis for treatment. Nasal washings and aspirates are unacceptable for Xpert Xpress SARS-CoV-2/FLU/RSV testing.  Fact Sheet for Patients: BloggerCourse.com  Fact Sheet for Healthcare Providers: SeriousBroker.it  This test is not yet approved or cleared by the [place] FDA and has been authorized for detection and/or diagnosis of SARS-CoV-2 by FDA under an Emergency Use Authorization (EUA). This EUA will remain in effect (meaning this test can be used) for the duration of the COVID-19 declaration under Section 564(b)(1) of the Act, 21 U.S.C. section 360bbb-3(b)(1), unless the authorization is terminated or revoked.     Resp Syncytial Virus by PCR NEGATIVE NEGATIVE Final    Comment: (NOTE) Fact Sheet for  Patients: BloggerCourse.com  Fact Sheet for Healthcare Providers: SeriousBroker.it  This test is not yet approved or cleared by the [place] FDA and has been authorized for detection and/or diagnosis of SARS-CoV-2 by FDA under an Emergency Use Authorization (EUA). This EUA will remain in effect (meaning this test can be used) for the duration of the COVID-19 declaration under Section 564(b)(1) of the Act, 21 U.S.C. section 360bbb-3(b)(1), unless the authorization is terminated or revoked.  Performed at [facility], [address], [place], [place] [address]   Respiratory (~20 pathogens) panel by PCR     Status: None   Collection Time: 05/18/23  1:37 AM   Specimen: Nasopharyngeal Swab; Respiratory  Result Value Ref Range Status   Adenovirus NOT DETECTED NOT DETECTED Final   Coronavirus 229E NOT DETECTED NOT DETECTED Final    Comment: (NOTE) The Coronavirus on the Respiratory Panel, DOES NOT test for the novel  Coronavirus (2019 nCoV)    Coronavirus HKU1 NOT DETECTED NOT DETECTED Final   Coronavirus NL63 NOT DETECTED NOT DETECTED Final   Coronavirus OC43 NOT DETECTED NOT DETECTED Final   Metapneumovirus NOT DETECTED NOT DETECTED Final   Rhinovirus / Enterovirus NOT DETECTED NOT DETECTED Final   Influenza A NOT DETECTED NOT DETECTED Final   Influenza B NOT DETECTED NOT DETECTED Final   Parainfluenza Virus 1 NOT DETECTED NOT DETECTED Final   Parainfluenza Virus 2 NOT DETECTED NOT DETECTED Final   Parainfluenza Virus 3 NOT DETECTED NOT DETECTED Final   Parainfluenza Virus 4 NOT DETECTED NOT DETECTED Final   Respiratory Syncytial  Virus NOT DETECTED NOT DETECTED Final   Bordetella pertussis NOT DETECTED NOT DETECTED Final   Bordetella Parapertussis NOT DETECTED NOT DETECTED Final   Chlamydophila pneumoniae NOT DETECTED NOT DETECTED Final   Mycoplasma pneumoniae NOT DETECTED NOT DETECTED Final    Comment:  Performed at [facility], [address] [place], [place] [address]   Scheduled Meds:  acetaminophen  650 mg Oral TID   arformoterol  15 mcg Nebulization BID   And   umeclidinium bromide  1 puff Inhalation Daily   bisacodyl  10 mg Oral Daily   buPROPion  300 mg Oral q morning   busPIRone  15 mg Oral q AM   cholecalciferol  1,000 Units Oral Daily   cyclobenzaprine  20 mg Oral QHS   cycloSPORINE  1 drop Both Eyes Q12H   diltiazem  120 mg Oral Daily   docusate sodium  200 mg Oral BID   DULoxetine  60 mg Oral BID   enoxaparin (LOVENOX) injection  70 mg Subcutaneous Q24H   gabapentin  100 mg Oral TID   insulin aspart  0-9 Units Subcutaneous TID WC   linaclotide  290 mcg Oral Once   melatonin  6 mg Oral QHS   nystatin   Topical TID   Oxcarbazepine  600 mg Oral TID   [START ON 05/25/2023] polyethylene glycol  34 g Oral Daily   senna  2 tablet Oral BID   SMOG  400 mL Rectal Once   tamsulosin  0.8 mg Oral Daily   traZODone  75 mg Oral QHS   Continuous Infusions:   LOS: 6 days   Time spent: 45 minutes  Carollee Herter, DO  [facility]  05/24/2023, 3:36 PM

## 2023-05-24 NOTE — Assessment & Plan Note (Signed)
 05-24-2023 pt seen by oncology. They will arrange for f/u if pt goes to appointment.  05-25-2023 f/u with oncology

## 2023-05-24 NOTE — Subjective & Objective (Addendum)
 Pt with passive-aggressive behavior again. Yesterday, Pt complaining about how he is constipated and requesting another enema to stimulate BM. When enema ordered, pt refused enema. This AM, pt in the bathroom complaining that he is having to strain to have a BM.  Stable for DC.

## 2023-05-24 NOTE — Assessment & Plan Note (Signed)
 05-17-2023 through 05-22-2024 antibiotics stopped as patient has received multiple courses of antibiotics in the last 3 months.  Respiratory virus panel is negative.  Procalcitonin has been less than 0.10.  Seen by palliative.  [facility] he refuses hospice.  He does not want to go home as he feels he cannot take care of himself.  His oxygen drops with any activities.  His roommate is not able to care for him.  He has had 5 admissions this year in the last 2-1/2 months.  He was suicidal a month ago.  He wants someone to take care of him.  At the same time he does not want to sign up for hospice.  PCCM saw him last admission and [facility] as [facility] was nothing they could offer him here.  He has  agreed to DNR/DNI. Palliative have started him on morphine for dyspnea he reports the morphine makes him very sick.  Will stop the morphine.   Appreciate oncology input patient to follow-up with Dr. [name] as an outpatient to start oral chemotherapy.  05-24-2023 continue with 5 L/min home O2. CM starting to look for SNF.  05-25-2023 yesterday, after agreeing to go to SNF if accepted, pt tells CM that he won't go to SNF because he can do physical therapy at home. SNF bed search canceled. I would not be surprised that when RN gets him ready for DC today, he tries to change his mind again and wants CM to look for SNF. Pt is stable for DC today.  Pt with chronic malingering behavior. Pt is chronically on 3-5 L/min home O2. He has chronic hypoxia. Do NOT admit patient for hypoxia. Keeps O2 sats 88-92%. Pt chronically complains of constipation but then refuses constipation treatment. Pt's O2 sats always drop with activity. This is NOT new. Do NOT admit the patient because his O2 sats decrease with activity.

## 2023-05-25 ENCOUNTER — Inpatient Hospital Stay (HOSPITAL_COMMUNITY)

## 2023-05-25 ENCOUNTER — Encounter (HOSPITAL_COMMUNITY): Payer: Self-pay

## 2023-05-25 ENCOUNTER — Encounter (HOSPITAL_COMMUNITY): Payer: Self-pay | Admitting: Internal Medicine

## 2023-05-25 ENCOUNTER — Emergency Department (HOSPITAL_COMMUNITY)
Admission: EM | Admit: 2023-05-25 | Discharge: 2023-05-26 | Disposition: A | Discharge status: Home / Self Care | Attending: Emergency Medicine | Admitting: Emergency Medicine

## 2023-05-25 ENCOUNTER — Emergency Department (HOSPITAL_COMMUNITY)

## 2023-05-25 DIAGNOSIS — Z7984 Long term (current) use of oral hypoglycemic drugs: Secondary | ICD-10-CM | POA: Diagnosis not present

## 2023-05-25 DIAGNOSIS — Z85118 Personal history of other malignant neoplasm of bronchus and lung: Secondary | ICD-10-CM | POA: Insufficient documentation

## 2023-05-25 DIAGNOSIS — J9621 Acute and chronic respiratory failure with hypoxia: Secondary | ICD-10-CM | POA: Diagnosis not present

## 2023-05-25 DIAGNOSIS — I129 Hypertensive chronic kidney disease with stage 1 through stage 4 chronic kidney disease, or unspecified chronic kidney disease: Secondary | ICD-10-CM | POA: Insufficient documentation

## 2023-05-25 DIAGNOSIS — N1831 Chronic kidney disease, stage 3a: Secondary | ICD-10-CM | POA: Insufficient documentation

## 2023-05-25 DIAGNOSIS — J449 Chronic obstructive pulmonary disease, unspecified: Secondary | ICD-10-CM | POA: Insufficient documentation

## 2023-05-25 DIAGNOSIS — C3492 Malignant neoplasm of unspecified part of left bronchus or lung: Secondary | ICD-10-CM | POA: Diagnosis not present

## 2023-05-25 DIAGNOSIS — G4733 Obstructive sleep apnea (adult) (pediatric): Secondary | ICD-10-CM

## 2023-05-25 DIAGNOSIS — R0609 Other forms of dyspnea: Secondary | ICD-10-CM | POA: Diagnosis not present

## 2023-05-25 DIAGNOSIS — Z7901 Long term (current) use of anticoagulants: Secondary | ICD-10-CM | POA: Diagnosis not present

## 2023-05-25 DIAGNOSIS — E1122 Type 2 diabetes mellitus with diabetic chronic kidney disease: Secondary | ICD-10-CM | POA: Diagnosis not present

## 2023-05-25 DIAGNOSIS — C799 Secondary malignant neoplasm of unspecified site: Secondary | ICD-10-CM | POA: Diagnosis not present

## 2023-05-25 DIAGNOSIS — R0602 Shortness of breath: Secondary | ICD-10-CM | POA: Diagnosis present

## 2023-05-25 DIAGNOSIS — Z765 Malingerer [conscious simulation]: Secondary | ICD-10-CM | POA: Diagnosis not present

## 2023-05-25 LAB — I-STAT ARTERIAL BLOOD GAS, ED
Acid-Base Excess: 4 mmol/L — ABNORMAL HIGH (ref 0.0–2.0)
Bicarbonate: 28.6 mmol/L — ABNORMAL HIGH (ref 20.0–28.0)
Calcium, Ion: 1.24 mmol/L (ref 1.15–1.40)
HCT: 36 % — ABNORMAL LOW (ref 39.0–52.0)
Hemoglobin: 12.2 g/dL — ABNORMAL LOW (ref 13.0–17.0)
O2 Saturation: 95 %
Patient temperature: 97.9
Potassium: 4.1 mmol/L (ref 3.5–5.1)
Sodium: 135 mmol/L (ref 135–145)
TCO2: 30 mmol/L (ref 22–32)
pCO2 arterial: 42.7 mmHg (ref 32–48)
pH, Arterial: 7.432 (ref 7.35–7.45)
pO2, Arterial: 73 mmHg — ABNORMAL LOW (ref 83–108)

## 2023-05-25 LAB — GLUCOSE, CAPILLARY
Glucose-Capillary: 81 mg/dL (ref 70–99)
Glucose-Capillary: 110 mg/dL — ABNORMAL HIGH (ref 70–99)

## 2023-05-25 MED ORDER — DILTIAZEM HCL ER COATED BEADS 120 MG PO CP24: 120.0000 mg | ORAL_CAPSULE | Freq: Every day | ORAL | 0 refills | Status: DC

## 2023-05-25 NOTE — ED Provider Notes (Signed)
  EMERGENCY DEPARTMENT AT [facility] Provider Note  CSN: [id number] Arrival date & time: 05/25/23 2232  Chief Complaint(s) Shortness of Breath and Suicidal  HPI Mario [name] is a 73 y.o. male with a past medical history listed below including COPD on 6 L nasal cannula, lung cancer status post radiation therapy, prior PE on Eliquis, discharged earlier today after being admitted for shortness of breath secondary to possible multifocal pneumonia:   Discharge summary: Acute on chronic respiratory failure with hypoxia (HCC) - chronically on 5 L/min home O2. 05-17-2023 through 05-22-2024 antibiotics stopped as patient has received multiple courses of antibiotics in the last 3 months.  Respiratory virus panel is negative.  Procalcitonin has been less than 0.10.  Seen by palliative.  Though [facility] he refuses hospice.  He does not want to go home as he feels he cannot take care of himself.  His oxygen drops with any activities.  His roommate is not able to care for him.  He has had 5 admissions this year in the last 2-1/2 months.  He was suicidal a month ago.  He wants someone to take care of him.  At the same time he does not want to sign up for hospice.  PCCM saw him last admission and [facility] as there was nothing they could offer him here.  He has  agreed to DNR/DNI. Palliative have started him on morphine for dyspnea he reports the morphine makes him very sick.  Will stop the morphine.   Appreciate oncology input patient to follow-up with Dr. [name] as an outpatient to start oral chemotherapy.   Patient returns today for persistent shortness of breath with exertion.  He reports being on 6 L nasal cannula at home with a concentrator that goes up to 10 L.  States that he notices his oxygen saturations dropped to the low 80s with exertion.  Also notes that his heart rate goes up to the 130s to 150s. Reports that he feels  lightheaded during these episodes.  Reports that he has a half flight of stairs at home that he has to utilize every day and requires help.  He feels that he cannot take care of himself at home.  Additionally he is complaining of chronic abdominal discomfort related to constipation.  Also endorsing passive suicidality which was previously addressed.  He has no active plan.  No additional new complaints.  The history is provided by the patient.    Past Medical History Past Medical History:  Diagnosis Date   Ankylosing spondylitis lumbar region (HCC) 09/25/2022   Anxiety    Bronchitis    COPD (chronic obstructive pulmonary disease) (HCC)    Depression    History of falling 07/29/2021   history of Pulmonary embolism (HCC) 07/02/2022   History of radiation therapy    Left lung- 10/05/20-10/15/20- Dr. Antony Blackbird   Hypertension    lung ca 09/2020   MI (myocardial infarction) [facility])    ????   On home oxygen therapy    4L/min [place]   OSA (obstructive sleep apnea)    Suicidal ideation 06/23/2019   Suicide attempt [facility])    Tension pneumothorax 06/27/2016   Uveitis    Patient Active Problem List   Diagnosis Date Noted   DNR (do not resuscitate)/DNI(Do not intubate) 05/24/2023   Acute on chronic respiratory failure with hypoxia (HCC) - chronically on 5 L/min home O2. 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 ENT consulted. nasal pack placed. Eliquis stopped indefinitely    Mood disorder (HCC) 02/03/2021   Non-small cell cancer of 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 09/21/2020   Constipation    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   Stage 3a chronic kidney disease (HCC) 12/26/2019   COPD (chronic obstructive pulmonary disease) (HCC) - Pt frequently wheezes after physical exertion. This does NOT mean he has a COPD exacerbation.    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 Authorizing Provider  acetaminophen (TYLENOL) 500 MG tablet Take 1,000 mg by mouth every 6 (six) hours as needed for mild pain (pain score 1-3), moderate pain (pain score 4-6) or headache.    [provider]  albuterol (PROVENTIL) (2.5 MG/3ML) 0.083% nebulizer solution Take 2.5 mg by nebulization every 6 (six) hours as needed for wheezing or shortness of breath.    [provider]  albuterol (VENTOLIN HFA) 108 (90 Base) MCG/ACT inhaler Inhale 2 puffs into the lungs every 4 (four) hours as needed for wheezing or shortness of breath. 11/04/21   Glade Lloyd, MD  azelastine (ASTELIN) 0.1 % nasal spray Place 1 spray into both nostrils 2 (two) times daily as needed for allergies. Use in each nostril as directed    [provider]  bisacodyl (DULCOLAX) 5 MG EC tablet Take 2 tablets (10 mg total) by mouth daily as  needed for moderate constipation. 01/26/23   Arby Barrette, MD  buPROPion (WELLBUTRIN XL) 300 MG 24 hr tablet Take 300 mg by mouth every morning. 02/05/20   [provider]  busPIRone (BUSPAR) 15 MG tablet Take 15 mg by mouth in the morning.    [provider]  Carboxymethylcellulose Sod PF 0.5 % SOLN Place 1 drop into both eyes 4 (four) times daily as needed (dry eyes).    [provider]  cholecalciferol (VITAMIN D3) 25 MCG (1000 UNIT) tablet Take 1,000 Units by mouth daily.    [provider]  cyclobenzaprine (FLEXERIL) 10 MG tablet Take 20 mg by mouth at bedtime.    [provider]  cycloSPORINE (RESTASIS) 0.05 % ophthalmic emulsion Place 1 drop into both eyes every 12 (twelve) hours.    [provider]  diltiazem (CARDIZEM CD) 120 MG 24 hr capsule Take 1 capsule (120 mg total) by mouth daily. 05/25/23 08/23/23  Carollee Herter, DO  docusate sodium (COLACE) 100 MG capsule Take 200 mg by mouth 2 (two) times daily.    [provider]  DULoxetine (CYMBALTA) 60 MG capsule Take 60 mg by mouth 2 (two) times daily.    [provider]  gabapentin (NEURONTIN) 100 MG capsule Take 100 mg by mouth 3 (three) times daily.    [provider]  LACTULOSE PO Take 2 Scoops by mouth 2 (two) times daily as needed. 2 tablespoonfuls    [provider]  Melatonin 3 MG CAPS Take 6 mg by mouth at bedtime.    [provider]  omeprazole (PRILOSEC) 20 MG capsule Take 1 capsule (20 mg total) by mouth daily. Patient taking differently: Take 20 mg by mouth daily as needed. 01/26/23   Arby Barrette, MD  ondansetron (ZOFRAN-ODT) 4 MG disintegrating tablet Take 1 tablet (4 mg total) by mouth every 8 (eight) hours as needed for nausea or vomiting. 05/23/23   Alwyn Ren, MD  Oxcarbazepine (TRILEPTAL) 300 MG tablet Take 600 mg by mouth in the morning, at noon, and at bedtime.    [provider]  OXYGEN Inhale 6 L/min  into the lungs daily.    [provider]  psyllium (HYDROCIL/METAMUCIL) 95 % PACK Take 1 packet by mouth daily. Patient not taking: Reported on 04/26/2023 04/02/23   Leatha Gilding, MD  senna (SENOKOT) 8.6 MG TABS tablet Take 2 tablets (17.2 mg total) by mouth 2 (two) times daily. 05/23/23   Alwyn Ren, MD  sodium chloride (OCEAN) 0.65 % SOLN nasal spray Place 1 spray into both nostrils daily at 2 am. Patient taking differently: Place 1 spray into both nostrils daily as needed for congestion. 04/24/23   Alwyn Ren, MD  tamsulosin (FLOMAX) 0.4 MG CAPS capsule Take 0.8 mg by mouth daily.    [provider]  Tiotropium Bromide-Olodaterol 2.5-2.5 MCG/ACT AERS Inhale 2 each into the lungs every morning. 2 puffs    [provider]  traZODone (DESYREL) 50 MG tablet Take 75 mg by mouth at bedtime.    [provider]                                                                                                                                    Allergies Demerol [meperidine], Zocor [simvastatin], Beet [beta vulgaris], and Liver  Review of Systems Review of Systems As noted in HPI  Physical Exam Vital Signs  I have reviewed the triage vital signs BP (!) 144/76 (BP Location: Right Arm)   Pulse 100   Temp 98.3 F (36.8 C) (Oral)   Resp (!) 21   Wt 122.5 kg   SpO2 92% Comment: on six L  Simultaneous filing. User may not have seen previous data.  BMI 34.67 kg/m   Physical Exam Vitals reviewed.  Constitutional:      General: He is not in acute distress.    Appearance: He  is well-developed. He is not diaphoretic.  HENT:     Head: Normocephalic and atraumatic.     Nose: Nose normal.  Eyes:     General: No scleral icterus.       Right eye: No discharge.        Left eye: No discharge.     Conjunctiva/sclera: Conjunctivae normal.     Pupils: Pupils are equal, round, and reactive to light.  Cardiovascular:     Rate and Rhythm: Normal rate  and regular rhythm.     Heart sounds: No murmur heard.    No friction rub. No gallop.  Pulmonary:     Effort: Pulmonary effort is normal. No respiratory distress.     Breath sounds: Normal breath sounds. No stridor. No rales.  Abdominal:     General: There is no distension.     Palpations: Abdomen is soft.     Tenderness: There is no abdominal tenderness.  Musculoskeletal:        General: No tenderness.     Cervical back: Normal range of motion and neck supple.  Skin:    General: Skin is warm and dry.     Findings: No erythema or rash.  Neurological:     Mental Status: He is alert and oriented to person, place, and time.     ED Results and Treatments Labs (all labs ordered are listed, but only abnormal results are displayed) Labs Reviewed  COMPREHENSIVE METABOLIC PANEL WITH GFR - Abnormal; Notable for the following components:      Result Value   Sodium 133 (*)    Glucose, Bld 113 (*)    Albumin 3.4 (*)    All other components within normal limits  CBC - Abnormal; Notable for the following components:   WBC 12.2 (*)    Hemoglobin 12.5 (*)    HCT 38.3 (*)    Platelets 463 (*)    All other components within normal limits  I-STAT ARTERIAL BLOOD GAS, ED - Abnormal; Notable for the following components:   pO2, Arterial 73 (*)    Bicarbonate 28.6 (*)    Acid-Base Excess 4.0 (*)    HCT 36.0 (*)    Hemoglobin 12.2 (*)    All other components within normal limits  RESP PANEL BY RT-PCR (RSV, FLU A&B, COVID)  [id number]                                                                                                                         EKG  EKG Interpretation Date/Time:  Thursday May 25 2023 22:36:07 EDT Ventricular Rate:  103 PR Interval:  201 QRS Duration:  117 QT Interval:  346 QTC Calculation: 453 R Axis:   -31  Text Interpretation: Sinus tachycardia Nonspecific intraventricular conduction delay Confirmed by Alona Bene [phone]) on 05/25/2023 10:41:50 PM        Radiology DG Chest Port 1 View Result Date: 05/25/2023 CLINICAL DATA:  sob EXAM: PORTABLE CHEST 1 VIEW COMPARISON:  Chest x-ray 05/16/2023, CT chest 05/17/2023. FINDINGS: The heart and mediastinal contours are within normal limits. Persistent bilateral airspace opacities most prominent along the mid to lower lung zones. Chronic coarsened interstitial markings with no overt pulmonary edema. Persistent at least small volume bilateral pleural effusions. No pneumothorax. No acute osseous abnormality. IMPRESSION: Persistent bilateral airspace opacities most prominent along the mid to lower lung zones. Persistent at least small volume bilateral pleural effusions. Electronically Signed   By: Tish Frederickson M.D.   On: 05/25/2023 23:44   DG Abd 1 View Result Date: 05/25/2023 CLINICAL DATA:  [id number] Constipation [id number] EXAM: ABDOMEN - 1 VIEW COMPARISON:  01/26/2023 FINDINGS: The bowel gas pattern is nonobstructive. Large volume stool throughout the colon. No radio-opaque calculi or other significant radiographic abnormality are seen. IMPRESSION: Large volume stool throughout the colon. Electronically Signed   By: Duanne Guess D.O.   On: 05/25/2023 09:05    Medications Ordered in ED Medications  ondansetron (ZOFRAN) injection 4 mg (4 mg Intravenous Given 05/26/23 0051)   Procedures Procedures  (including critical care time) Medical Decision Making / ED Course   Medical Decision Making Amount and/or Complexity of Data Reviewed Labs: ordered. Decision-making details documented in ED Course. Radiology: ordered and independent interpretation performed. Decision-making details documented in ED Course. ECG/medicine tests: ordered and independent interpretation performed. Decision-making details documented in ED Course.  Risk Prescription drug management.    Patient returns for persistent shortness of breath. Ambulated here on pulse ox and oxygen dropped to 88% on his baseline 6 L nasal cannula.   Heart rate increased to 112 during ambulation.  At rest, patient quickly recovered.  Rest of the workup was reassuring without significant anemia.  No AKI.  No significant electrolyte derangements.  Chest x-ray shows stable bilateral air opacities.  No new or acute changes.  EKG without tachycardia dysrhythmias.  TOC for additional home health ordered    Final Clinical Impression(s) / ED Diagnoses Final diagnoses:  DOE (dyspnea on exertion)   The patient appears reasonably screened and/or stabilized for discharge and I doubt any other medical condition or other [facility] requiring further screening, evaluation, or treatment in the ED at this time. I have discussed the findings, Dx and Tx plan with the patient/family who expressed understanding and agree(s) with the plan. Discharge instructions discussed at length. The patient/family was given strict return precautions who verbalized understanding of the instructions. No further questions at time of discharge.  Disposition: Discharge  Condition: Good  ED Discharge Orders     None        Follow Up: Clinic, [name] [address] [facility] [place] [address] [phone]  Call  to schedule an appointment for close follow up    This chart was dictated using voice recognition software.  Despite best efforts to proofread,  errors can occur which can change the documentation meaning.    Nira Conn, MD 05/26/23 [phone]

## 2023-05-25 NOTE — Assessment & Plan Note (Signed)
 he was on Eliquis during his admission in February 2025 this was stopped during that time due to ongoing hemoptysis followed by he developed epistaxis ENT had to be consulted and nasal pack placed. Eliquis was stopped indefinitely at that point.

## 2023-05-25 NOTE — TOC Transition Note (Signed)
 Transition of Care [facility]) - Discharge Note   Patient Details  Name: Mario [name] MRN: [id number] Date of Birth: 11/19/1950  Transition of [facility] CM/SW Contact:  Otelia Santee, LCSW Phone Number: 05/25/2023, 9:53 AM   Clinical Narrative:    Pt to return home with home health PT through [facility]. Pt agreeable to ambulance trasnport home. PTAR called at 9:47am. DC packet with signed DNR placed at RN station.    Final next level of care: Home w Home Health Services Barriers to Discharge: No Barriers Identified   Patient Goals and CMS Choice Patient states their goals for this hospitalization and ongoing recovery are:: Wants to go to SNF          Discharge Placement                       Discharge Plan and Services Additional resources added to the After Visit Summary for   In-house Referral: NA Discharge Planning Services: NA Post Acute Care Choice: Skilled Nursing Facility, Nursing Home                               Social Drivers of Health (SDOH) Interventions SDOH Screenings   Food Insecurity: No Food Insecurity (05/19/2023)  Housing: Low Risk  (05/19/2023)  Transportation Needs: No Transportation Needs (05/19/2023)  Utilities: Not At Risk (05/19/2023)  Financial Resource Strain: Low Risk  (09/23/2020)  Social Connections: Socially Isolated (05/20/2023)  Stress: No Stress Concern Present (09/23/2020)  Tobacco Use: Medium Risk (05/18/2023)     Readmission Risk Interventions    05/06/2023    3:25 PM 04/24/2023   10:09 AM 03/25/2023    2:23 PM  Readmission Risk Prevention Plan  Transportation Screening Complete Complete Complete  Medication Review Oceanographer) Complete Complete Complete  PCP or Specialist appointment within 3-5 days of discharge Complete    HRI or Home Care Consult Complete Complete Complete  SW Recovery Care/Counseling Consult Complete Complete Complete  Palliative Care Screening Not Applicable Not Applicable Not  Applicable  Skilled Nursing Facility Not Applicable Not Applicable Not Applicable

## 2023-05-25 NOTE — ED Triage Notes (Signed)
 Pt BIBA due to SOB, pt normally is on 6Ls of oxygen via [place]. PT went to the restroom and became SOB, with fire pt was 88% on his 6ls pt had be placed on 15L's and only got up to low 90's. Pt has hx of COPD, stage 4 lung cancer. PT is to start chemo in a few wks. PT said he is becoming a little suicidal. When ask what that mean, pt said he is sick of his conduction and quality of life. PT said he didn't have a plan to harm is self. Pt seems more sad about his health conduction and not coming up with a plan to harm himself.

## 2023-05-25 NOTE — Progress Notes (Signed)
 PROGRESS NOTE    Mario [name]  NWG:[id number] DOB: 11-17-1950 DOA: 05/17/2023 PCP: [name]  Subjective: Pt with passive-aggressive behavior again. Yesterday, Pt complaining about how he is constipated and requesting another enema to stimulate BM. When enema ordered, pt refused enema. This AM, pt in the bathroom complaining that he is having to strain to have a BM.  Stable for DC.   Hospital Course: HPI: Mario [name] is a 73 y.o. male with medical history significant of shortness of breath as a result of COPD, lung cancer, who has had recurrent hospitalization with acute on chronic hypoxic respiratory failure secondary to right lower lobe pneumonia and COPD exacerbation, suicidal ideation, multiple medical problems.  Patient was admitted and discharged today on oxygen.  He was discharged after several attempts and as patient was going home at the parking lot apparently he got more short of breath and return to the ER.  Patient reported that he has his concentrator going only to 5 L.  On room air he is on 4 L.  When he arrived the ER his heart rate was in the 150s.  He was placed on 6 L of oxygen and then being admitted to the medical service.  Currently he is stable on 4 L at rest.  He goes to the [place] and insist that the [place] will not approve concentrator that is more than 5 L/min.  He is scared of going home.  He has had chronic respiratory failure and appears to be at his baseline earlier in the day.  We are admitting the patient for evaluation and reassess his care plan and discharge planning.    Significant Events: Admitted 05/17/2023 acute on chronic respiratory failure   Significant Labs: WBC 13.2, HgB 12.6, plt 434 Na 134, K 4.4, CO2 of 24, BUN 14, Scr 0.8, glu 177  Significant Imaging Studies: CTPA No evidence of pulmonary embolism. 2. 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 3. Predominantly stable, marked severity lingular and  bilateral lower lobe consolidation, consistent with multifocal pneumonia. 4. Small, stable right pleural effusion. 5. Stable bilateral simple renal cysts. No follow-up imaging is recommended  Antibiotic Therapy: Anti-infectives (From admission, onward)    Start     Dose/Rate Route Frequency Ordered Stop   05/18/23 1400  vancomycin (VANCOREADY) IVPB 1500 mg/300 mL  Status:  Discontinued        1,500 mg 150 mL/hr over 120 Minutes Intravenous Every 12 hours 05/18/23 0222 05/18/23 1040   05/18/23 1000  ceFEPIme (MAXIPIME) 2 g in sodium chloride 0.9 % 100 mL IVPB  Status:  Discontinued        2 g 200 mL/hr over 30 Minutes Intravenous Every 8 hours 05/18/23 0247 05/18/23 1040   05/18/23 0200  doxycycline (VIBRAMYCIN) 100 mg in sodium chloride 0.9 % 250 mL IVPB  Status:  Discontinued        100 mg 125 mL/hr over 120 Minutes Intravenous Every 12 hours 05/18/23 0128 05/18/23 1040   05/18/23 0030  ceFEPIme (MAXIPIME) 2 g in sodium chloride 0.9 % 100 mL IVPB        2 g 200 mL/hr over 30 Minutes Intravenous  Once 05/18/23 0022 05/18/23 0303   05/18/23 0030  vancomycin (VANCOREADY) IVPB 2000 mg/400 mL        2,000 mg 200 mL/hr over 120 Minutes Intravenous  Once 05/18/23 0022 05/18/23 0450       Procedures:   Consultants: Palliative care oncology    Assessment  and Plan: * Acute on chronic respiratory failure with hypoxia (HCC) - chronically on 5 L/min home O2. 05-17-2023 through 05-22-2024 antibiotics stopped as patient has received multiple courses of antibiotics in the last 3 months.  Respiratory virus panel is negative.  Procalcitonin has been less than 0.10.  Seen by palliative.  Though [facility] he refuses hospice.  He does not want to go home as he feels he cannot take care of himself.  His oxygen drops with any activities.  His roommate is not able to care for him.  He has had 5 admissions this year in the last 2-1/2 months.  He was suicidal a month ago.  He  wants someone to take care of him.  At the same time he does not want to sign up for hospice.  PCCM saw him last admission and [facility] as [facility] was nothing they could offer him here.  He has  agreed to DNR/DNI. Palliative have started him on morphine for dyspnea he reports the morphine makes him very sick.  Will stop the morphine.   Appreciate oncology input patient to follow-up with Dr. [name] as an outpatient to start oral chemotherapy.  05-24-2023 continue with 5 L/min home O2. CM starting to look for SNF.  05-25-2023 yesterday, after agreeing to go to SNF if accepted, pt tells CM that he won't go to SNF because he can do physical therapy at home. SNF bed search canceled. I would not be surprised that when RN gets him ready for DC today, he tries to change his mind again and wants CM to look for SNF. Pt is stable for DC today.  Pt with chronic malingering behavior. Pt is chronically on 3-5 L/min home O2. He has chronic hypoxia. Do NOT admit patient for hypoxia. Keeps O2 sats 88-92%. Pt chronically complains of constipation but then refuses constipation treatment. Pt's O2 sats always drop with activity. This is NOT new. Do NOT admit the patient because his O2 sats decrease with activity.   Malingering 05-24-2023 pt with history of malingering to stay in the hospital. Pt at times refuses to go to SNF and refuses to be discharged to home.  He often falsely testifies about his home situation, financial situation, transportation situation in order to stay in the hospital longer or to absolve himself of any responsibility in providing his own care. CM to attempt to look for SNF. Pt made statement today that [facility] eligible for [facility] status. Not sure if this is accurate because at age 52, I doubt he would be employable to being with. Pt states that this [facility] will be virtual and may possible make him  eligible for 100% service connected status. I doubt any of this story is true.   05-25-2023 Pt with passive-aggressive behavior again. Yesterday, Pt complaining about how he is constipated and requesting another enema to stimulate BM. When enema ordered, pt refused enema. This AM, pt in the bathroom complaining that he is having to strain to have a BM.   Metastatic carcinoma (HCC) 05-24-2023 pt seen by oncology. They will arrange for f/u if pt goes to appointment.  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 05-24-2023 pt seen by oncology. They will arrange for f/u if pt goes to appointment.  05-25-2023 f/u with oncology  Chronic pain 05-24-2023  does not have a regular prescriber for his opiates.  05-25-2023 no opiate Rx will be sent home with patient. He does not have a regular provider for opiates.  Stage 3a chronic kidney disease (HCC) 05-24-2023 stable.  COPD (chronic obstructive pulmonary disease) (HCC) - Pt frequently wheezes after physical exertion. This does NOT mean he has a COPD exacerbation. 05-24-2023 currently not exacerbated  05-25-2023 not exacerbated. No indication for systemic steroids. Pt frequently wheezes after physical exertion. This does NOT mean he has a COPD exacerbation.  OSA (obstructive sleep apnea) 05-24-2023 non-compliant with wearing CPAP  05-25-2023 non-complaint with CPAP. Refused CPAP again last night.  DM2 (diabetes mellitus, type 2) (HCC) 05-24-2023 continue SSI.  05-25-2023 continue with SSI.  Hypertension associated with diabetes (HCC) 05-24-2023 continue cardizem-CD 120 mg daily.  05-25-2023 continue with reduced dose cardizem CD 120 mg daily. Hydralazine stopped. These changes made due to orthostatic hypotension noted during this admission.  History of pulmonary embolus (PE) - Eliquis during February 2025 admission was stopped due to ongoing hemoptysis and epistaxis. ENT consulted.  nasal pack placed. Eliquis stopped indefinitely he was on Eliquis during his admission in February 2025 this was stopped during that time due to ongoing hemoptysis followed by he developed epistaxis ENT had to be consulted and nasal pack placed. Eliquis was stopped indefinitely at that point.   DNR (do not resuscitate)/DNI(Do not intubate) Made DNR/DNI by palliative care on 05-18-2023.  DVT prophylaxis:   SCDs   Code Status: Limited: Do not attempt resuscitation (DNR) -DNR-LIMITED -Do Not Intubate/DNI  Family Communication: no family at bedside. Pt is decisional. Disposition Plan: return home. Pt refused SNF placement Reason for continuing need for hospitalization: medically stable for DC 48 hours ago.  Objective: Vitals:   05/24/23 1131 05/24/23 2025 05/24/23 2041 05/25/23 0526  BP: (!) 167/81 138/72  137/68  Pulse: 94 91  87  Resp:  18  20  Temp: 97.9 F (36.6 C) 97.7 F (36.5 C)  97.8 F (36.6 C)  TempSrc: Oral Oral  Oral  SpO2: 94% 96% 92% 94%  Weight:      Height:        Intake/Output Summary (Last 24 hours) at 05/25/2023 0717 Last data filed at 05/24/2023 1700 Gross per 24 hour  Intake 640 ml  Output 645 ml  Net -5 ml   Filed Weights   05/17/23 1944 05/20/23 1037  Weight: 136 kg 136 kg    Examination:  Physical Exam Vitals and nursing note reviewed.  Constitutional:      Comments: Pt in bathroom  Pulmonary:     Comments: Talking in full sentences. No breathlessness noted. Neurological:     Mental Status: He is alert.     Data Reviewed: I have personally reviewed following labs and imaging studies  CBC: Recent Labs  Lab 05/19/23 0520  WBC 10.0  HGB 11.7*  HCT 36.8*  MCV 89.8  PLT 359   Basic Metabolic Panel: Recent Labs  Lab 05/20/23 0558  NA 136  K 4.3  CL 98  CO2 30  GLUCOSE 114*  BUN 13  CREATININE 0.89  CALCIUM 8.7*   GFR: Estimated Creatinine Clearance: 111.7 mL/min (by C-G formula based on SCr of 0.89 mg/dL). BNP (last 3  results) Recent Labs    04/28/23 1840 05/16/23 1529 05/17/23 2012  BNP 45.4 41.5 46.5   CBG: Recent Labs  Lab 05/23/23 1730 05/23/23 2125 05/24/23 0712 05/24/23 1129 05/24/23 1631  GLUCAP 79 157* 99 95 128*  Recent Results (from the past 240 hours)  Resp panel by RT-PCR (RSV, Flu A&B, Covid) Anterior Nasal Swab     Status: None   Collection Time: 05/16/23  3:31 PM   Specimen: Anterior Nasal Swab  Result Value Ref Range Status   SARS Coronavirus 2 by RT PCR NEGATIVE NEGATIVE Final    Comment: (NOTE) SARS-CoV-2 target nucleic acids are NOT DETECTED.  The SARS-CoV-2 RNA is generally detectable in upper respiratory specimens during the acute phase of infection. The lowest concentration of SARS-CoV-2 viral copies this assay can detect is 138 copies/mL. A negative result does not preclude SARS-Cov-2 infection and should not be used as the sole basis for treatment or other patient management decisions. A negative result may occur with  improper specimen collection/handling, submission of specimen other than nasopharyngeal swab, presence of viral mutation(s) within the areas targeted by this assay, and inadequate number of viral copies(<138 copies/mL). A negative result must be combined with clinical observations, patient history, and epidemiological information. The expected result is Negative.  Fact Sheet for Patients:  BloggerCourse.com  Fact Sheet for Healthcare Providers:  SeriousBroker.it  This test is no t yet approved or cleared by the [place] FDA and  has been authorized for detection and/or diagnosis of SARS-CoV-2 by FDA under an Emergency Use Authorization (EUA). This EUA will remain  in effect (meaning this test can be used) for the duration of the COVID-19 declaration under Section 564(b)(1) of the Act, 21 U.S.C.section 360bbb-3(b)(1), unless the authorization is terminated  or revoked sooner.        Influenza A by PCR NEGATIVE NEGATIVE Final   Influenza B by PCR NEGATIVE NEGATIVE Final    Comment: (NOTE) The Xpert Xpress SARS-CoV-2/FLU/RSV plus assay is intended as an aid in the diagnosis of influenza from Nasopharyngeal swab specimens and should not be used as a sole basis for treatment. Nasal washings and aspirates are unacceptable for Xpert Xpress SARS-CoV-2/FLU/RSV testing.  Fact Sheet for Patients: BloggerCourse.com  Fact Sheet for Healthcare Providers: SeriousBroker.it  This test is not yet approved or cleared by the [place] FDA and has been authorized for detection and/or diagnosis of SARS-CoV-2 by FDA under an Emergency Use Authorization (EUA). This EUA will remain in effect (meaning this test can be used) for the duration of the COVID-19 declaration under Section 564(b)(1) of the Act, 21 U.S.C. section 360bbb-3(b)(1), unless the authorization is terminated or revoked.     Resp Syncytial Virus by PCR NEGATIVE NEGATIVE Final    Comment: (NOTE) Fact Sheet for Patients: BloggerCourse.com  Fact Sheet for Healthcare Providers: SeriousBroker.it  This test is not yet approved or cleared by the [place] FDA and has been authorized for detection and/or diagnosis of SARS-CoV-2 by FDA under an Emergency Use Authorization (EUA). This EUA will remain in effect (meaning this test can be used) for the duration of the COVID-19 declaration under Section 564(b)(1) of the Act, 21 U.S.C. section 360bbb-3(b)(1), unless the authorization is terminated or revoked.  Performed at [facility], [address], [place], [place] [address]   Resp panel by RT-PCR (RSV, Flu A&B, Covid) Anterior Nasal Swab     Status: None   Collection Time: 05/17/23  8:12 PM   Specimen: Anterior Nasal Swab  Result Value Ref Range Status   SARS Coronavirus 2 by RT PCR NEGATIVE  NEGATIVE Final    Comment: (NOTE) SARS-CoV-2 target nucleic acids are NOT DETECTED.  The SARS-CoV-2 RNA is generally detectable in upper respiratory specimens  during the acute phase of infection. The lowest concentration of SARS-CoV-2 viral copies this assay can detect is 138 copies/mL. A negative result does not preclude SARS-Cov-2 infection and should not be used as the sole basis for treatment or other patient management decisions. A negative result may occur with  improper specimen collection/handling, submission of specimen other than nasopharyngeal swab, presence of viral mutation(s) within the areas targeted by this assay, and inadequate number of viral copies(<138 copies/mL). A negative result must be combined with clinical observations, patient history, and epidemiological information. The expected result is Negative.  Fact Sheet for Patients:  BloggerCourse.com  Fact Sheet for Healthcare Providers:  SeriousBroker.it  This test is no t yet approved or cleared by the [place] FDA and  has been authorized for detection and/or diagnosis of SARS-CoV-2 by FDA under an Emergency Use Authorization (EUA). This EUA will remain  in effect (meaning this test can be used) for the duration of the COVID-19 declaration under Section 564(b)(1) of the Act, 21 U.S.C.section 360bbb-3(b)(1), unless the authorization is terminated  or revoked sooner.       Influenza A by PCR NEGATIVE NEGATIVE Final   Influenza B by PCR NEGATIVE NEGATIVE Final    Comment: (NOTE) The Xpert Xpress SARS-CoV-2/FLU/RSV plus assay is intended as an aid in the diagnosis of influenza from Nasopharyngeal swab specimens and should not be used as a sole basis for treatment. Nasal washings and aspirates are unacceptable for Xpert Xpress SARS-CoV-2/FLU/RSV testing.  Fact Sheet for Patients: BloggerCourse.com  Fact Sheet for Healthcare  Providers: SeriousBroker.it  This test is not yet approved or cleared by the [place] FDA and has been authorized for detection and/or diagnosis of SARS-CoV-2 by FDA under an Emergency Use Authorization (EUA). This EUA will remain in effect (meaning this test can be used) for the duration of the COVID-19 declaration under Section 564(b)(1) of the Act, 21 U.S.C. section 360bbb-3(b)(1), unless the authorization is terminated or revoked.     Resp Syncytial Virus by PCR NEGATIVE NEGATIVE Final    Comment: (NOTE) Fact Sheet for Patients: BloggerCourse.com  Fact Sheet for Healthcare Providers: SeriousBroker.it  This test is not yet approved or cleared by the [place] FDA and has been authorized for detection and/or diagnosis of SARS-CoV-2 by FDA under an Emergency Use Authorization (EUA). This EUA will remain in effect (meaning this test can be used) for the duration of the COVID-19 declaration under Section 564(b)(1) of the Act, 21 U.S.C. section 360bbb-3(b)(1), unless the authorization is terminated or revoked.  Performed at [facility], [address], [place], [place] [address]   Respiratory (~20 pathogens) panel by PCR     Status: None   Collection Time: 05/18/23  1:37 AM   Specimen: Nasopharyngeal Swab; Respiratory  Result Value Ref Range Status   Adenovirus NOT DETECTED NOT DETECTED Final   Coronavirus 229E NOT DETECTED NOT DETECTED Final    Comment: (NOTE) The Coronavirus on the Respiratory Panel, DOES NOT test for the novel  Coronavirus (2019 nCoV)    Coronavirus HKU1 NOT DETECTED NOT DETECTED Final   Coronavirus NL63 NOT DETECTED NOT DETECTED Final   Coronavirus OC43 NOT DETECTED NOT DETECTED Final   Metapneumovirus NOT DETECTED NOT DETECTED Final   Rhinovirus / Enterovirus NOT DETECTED NOT DETECTED Final   Influenza A NOT DETECTED NOT DETECTED Final   Influenza B  NOT DETECTED NOT DETECTED Final   Parainfluenza Virus 1 NOT DETECTED NOT DETECTED Final   Parainfluenza Virus 2 NOT  DETECTED NOT DETECTED Final   Parainfluenza Virus 3 NOT DETECTED NOT DETECTED Final   Parainfluenza Virus 4 NOT DETECTED NOT DETECTED Final   Respiratory Syncytial Virus NOT DETECTED NOT DETECTED Final   Bordetella pertussis NOT DETECTED NOT DETECTED Final   Bordetella Parapertussis NOT DETECTED NOT DETECTED Final   Chlamydophila pneumoniae NOT DETECTED NOT DETECTED Final   Mycoplasma pneumoniae NOT DETECTED NOT DETECTED Final    Comment: Performed at [facility], [address], [place], [place] [address]     Radiology Studies: No results found.  Scheduled Meds:  acetaminophen  650 mg Oral TID   arformoterol  15 mcg Nebulization BID   And   umeclidinium bromide  1 puff Inhalation Daily   bisacodyl  10 mg Oral Daily   buPROPion  300 mg Oral q morning   busPIRone  15 mg Oral q AM   cholecalciferol  1,000 Units Oral Daily   cyclobenzaprine  20 mg Oral QHS   cycloSPORINE  1 drop Both Eyes Q12H   diltiazem  120 mg Oral Daily   docusate sodium  200 mg Oral BID   DULoxetine  60 mg Oral BID   enoxaparin (LOVENOX) injection  70 mg Subcutaneous Q24H   gabapentin  100 mg Oral TID   insulin aspart  0-9 Units Subcutaneous TID WC   melatonin  6 mg Oral QHS   nystatin   Topical TID   Oxcarbazepine  600 mg Oral TID   polyethylene glycol  34 g Oral Daily   senna  2 tablet Oral BID   SMOG  400 mL Rectal Once   tamsulosin  0.8 mg Oral Daily   traZODone  75 mg Oral QHS   Continuous Infusions:   LOS: 7 days   Time spent: 40 minutes  Carollee Herter, DO  [facility]  05/25/2023, 7:17 AM

## 2023-05-25 NOTE — Care Management Important Message (Signed)
 Important Message  Patient Details I Letter given to the Patient. Name: Mario [name] MRN: [id number] Date of Birth: [birth year]   Important Message Given:  Yes - Medicare IM     Caren Macadam 05/25/2023, 8:46 AM

## 2023-05-25 NOTE — Discharge Summary (Signed)
 [facility] Discharge Summary   Patient name: Mario [name]  Admit date:     05/17/2023  Discharge date: 05/25/2023  Attending Physician: Alwyn Ren [[id number]]  Discharge Physician: Carollee Herter   PCP: [name]  Admitted From: Home  Disposition:  Home  Recommendations for Outpatient Follow-up:  Follow up with PCP in 1-2 weeks F/U with oncology  Home Health:Yes. Home health PT Equipment/Devices: Oxygen 5 L/min. Already has home O2 prior to admission.  Discharge Condition:Stable. High risk for readmission. CODE STATUS:DNR/DNI Diet recommendation: Diabetic Fluid Restriction: None  Hospital Summary: HPI: Mario [name] is a 73 y.o. male with medical history significant of shortness of breath as a result of COPD, lung cancer, who has had recurrent hospitalization with acute on chronic hypoxic respiratory failure secondary to right lower lobe pneumonia and COPD exacerbation, suicidal ideation, multiple medical problems.  Patient was admitted and discharged today on oxygen.  He was discharged after several attempts and as patient was going home at the parking lot apparently he got more short of breath and return to the ER.  Patient reported that he has his concentrator going only to 5 L.  On room air he is on 4 L.  When he arrived the ER his heart rate was in the 150s.  He was placed on 6 L of oxygen and then being admitted to the medical service.  Currently he is stable on 4 L at rest.  He goes to the [place] and insist that the [place] will not approve concentrator that is more than 5 L/min.  He is scared of going home.  He has had chronic respiratory failure and appears to be at his baseline earlier in the day.  We are admitting the patient for evaluation and reassess his care plan and discharge planning.    Significant Events: Admitted 05/17/2023 acute on chronic respiratory failure   Significant Labs: WBC 13.2, HgB 12.6, plt 434 Na 134, K 4.4, CO2 of  24, BUN 14, Scr 0.8, glu 177  Significant Imaging Studies: CTPA No evidence of pulmonary embolism. 2. 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 3. Predominantly stable, marked severity lingular and bilateral lower lobe consolidation, consistent with multifocal pneumonia. 4. Small, stable right pleural effusion. 5. Stable bilateral simple renal cysts. No follow-up imaging is recommended  Antibiotic Therapy: Anti-infectives (From admission, onward)    Start     Dose/Rate Route Frequency Ordered Stop   05/18/23 1400  vancomycin (VANCOREADY) IVPB 1500 mg/300 mL  Status:  Discontinued        1,500 mg 150 mL/hr over 120 Minutes Intravenous Every 12 hours 05/18/23 0222 05/18/23 1040   05/18/23 1000  ceFEPIme (MAXIPIME) 2 g in sodium chloride 0.9 % 100 mL IVPB  Status:  Discontinued        2 g 200 mL/hr over 30 Minutes Intravenous Every 8 hours 05/18/23 0247 05/18/23 1040   05/18/23 0200  doxycycline (VIBRAMYCIN) 100 mg in sodium chloride 0.9 % 250 mL IVPB  Status:  Discontinued        100 mg 125 mL/hr over 120 Minutes Intravenous Every 12 hours 05/18/23 0128 05/18/23 1040   05/18/23 0030  ceFEPIme (MAXIPIME) 2 g in sodium chloride 0.9 % 100 mL IVPB        2 g 200 mL/hr over 30 Minutes Intravenous  Once 05/18/23 0022 05/18/23 0303   05/18/23 0030  vancomycin (VANCOREADY) IVPB 2000 mg/400 mL        2,000  mg 200 mL/hr over 120 Minutes Intravenous  Once 05/18/23 0022 05/18/23 0450       Procedures:   Consultants: Palliative care oncology   Hospital Course by Problem: * Acute on chronic respiratory failure with hypoxia (HCC) - chronically on 5 L/min home O2. 05-17-2023 through 05-22-2024 antibiotics stopped as patient has received multiple courses of antibiotics in the last 3 months.  Respiratory virus panel is negative.  Procalcitonin has been less than 0.10.  Seen by palliative.  Though [facility] he refuses hospice.  He does not want to  go home as he feels he cannot take care of himself.  His oxygen drops with any activities.  His roommate is not able to care for him.  He has had 5 admissions this year in the last 2-1/2 months.  He was suicidal a month ago.  He wants someone to take care of him.  At the same time he does not want to sign up for hospice.  PCCM saw him last admission and [facility] as [facility] was nothing they could offer him here.  He has  agreed to DNR/DNI. Palliative have started him on morphine for dyspnea he reports the morphine makes him very sick.  Will stop the morphine.   Appreciate oncology input patient to follow-up with Dr. [name] as an outpatient to start oral chemotherapy.  05-24-2023 continue with 5 L/min home O2. CM starting to look for SNF.  05-25-2023 yesterday, after agreeing to go to SNF if accepted, pt tells CM that he won't go to SNF because he can do physical therapy at home. SNF bed search canceled. I would not be surprised that when RN gets him ready for DC today, he tries to change his mind again and wants CM to look for SNF. Pt is stable for DC today.  Pt with chronic malingering behavior. Pt is chronically on 3-5 L/min home O2. He has chronic hypoxia. Do NOT admit patient for hypoxia. Keeps O2 sats 88-92%. Pt chronically complains of constipation but then refuses constipation treatment. Pt's O2 sats always drop with activity. This is NOT new. Do NOT admit the patient because his O2 sats decrease with activity.   Malingering 05-24-2023 pt with history of malingering to stay in the hospital. Pt at times refuses to go to SNF and refuses to be discharged to home.  He often falsely testifies about his home situation, financial situation, transportation situation in order to stay in the hospital longer or to absolve himself of any responsibility in providing his own care. CM to attempt to look for SNF. Pt made statement today that [facility] see if he is eligible for unemployment status. Not sure if this is accurate because at age 68, I doubt he would be employable to being with. Pt states that this [facility] will be virtual and may possible make him eligible for 100% service connected status. I doubt any of this story is true.   05-25-2023 Pt with passive-aggressive behavior again. Yesterday, Pt complaining about how he is constipated and requesting another enema to stimulate BM. When enema ordered, pt refused enema. This AM, pt in the bathroom complaining that he is having to strain to have a BM.   Metastatic carcinoma (HCC) 05-24-2023 pt seen by oncology. They will arrange for f/u if pt goes to appointment.  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 05-24-2023 pt seen by oncology. They will arrange for f/u if pt goes to appointment.  05-25-2023 f/u with oncology  Chronic pain 05-24-2023 does not have a regular prescriber for his opiates.  05-25-2023 no opiate Rx will be sent home with patient. He does not have a regular provider for opiates.  Stage 3a chronic kidney disease (HCC) 05-24-2023 stable.  COPD (chronic obstructive pulmonary disease) (HCC) - Pt frequently wheezes after physical exertion. This does NOT mean he has a COPD exacerbation. 05-24-2023 currently not exacerbated  05-25-2023 not exacerbated. No indication for systemic steroids. Pt frequently wheezes after physical exertion. This does NOT mean he has a COPD exacerbation.  OSA (obstructive sleep apnea) 05-24-2023 non-compliant with wearing CPAP  05-25-2023 non-complaint with CPAP. Refused CPAP again last night.  DM2 (diabetes mellitus, type 2) (HCC) 05-24-2023 continue SSI.  05-25-2023 continue with SSI.  Hypertension associated with diabetes (HCC) 05-24-2023 continue cardizem-CD 120 mg daily.  05-25-2023 continue with reduced dose cardizem CD 120 mg daily. Hydralazine  stopped. These changes made due to orthostatic hypotension noted during this admission.  History of pulmonary embolus (PE) - Eliquis during February 2025 admission was stopped due to ongoing hemoptysis and epistaxis. ENT consulted. nasal pack placed. Eliquis stopped indefinitely he was on Eliquis during his admission in February 2025 this was stopped during that time due to ongoing hemoptysis followed by he developed epistaxis ENT had to be consulted and nasal pack placed. Eliquis was stopped indefinitely at that point.   DNR (do not resuscitate)/DNI(Do not intubate) Made DNR/DNI by palliative care on 05-18-2023.    Discharge Diagnoses:  Principal Problem:   Acute on chronic respiratory failure with hypoxia (HCC) - chronically on 5 L/min home O2. Active Problems:   Malingering   Non-small cell cancer of 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   Metastatic carcinoma (HCC)   Hypertension associated with diabetes (HCC)   DM2 (diabetes mellitus, type 2) (HCC)   OSA (obstructive sleep apnea)   COPD (chronic obstructive pulmonary disease) (HCC) - Pt frequently wheezes after physical exertion. This does NOT mean he has a COPD exacerbation.   Stage 3a chronic kidney disease (HCC)   Chronic pain   History of pulmonary embolus (PE) - Eliquis during February 2025 admission was stopped due to ongoing hemoptysis and epistaxis. ENT consulted. nasal pack placed. Eliquis stopped indefinitely   DNR (do not resuscitate)/DNI(Do not intubate)   Discharge Instructions  Discharge Instructions     Ambulatory referral to Hematology / Oncology   Complete by: As directed    F/u with Dr. [name]   Diet - low sodium heart healthy   Complete by: As directed    Diet Carb Modified   Complete by: As directed    Discharge instructions   Complete by: As directed    1. Follow up with your primary care provider in 1-2 weeks following discharge from hospital. 2. Oncology will schedule a follow appointment for  you in regards to chemotherapy.   Increase activity slowly   Complete by: As directed       Allergies as of 05/25/2023       Reactions   Demerol [meperidine] Nausea And Vomiting, Other (See Comments)   Made the patient "violently sick"   Zocor [simvastatin] Nausea And Vomiting, Other (See Comments)   Made him very jittery, also   Beet [beta Vulgaris] Nausea And Vomiting   Liver Nausea And Vomiting   Any Liver  Medication List     STOP taking these medications    hydrALAZINE 25 MG tablet Commonly known as: APRESOLINE   Oxycodone HCl 10 MG Tabs   predniSONE 10 MG tablet Commonly known as: DELTASONE       TAKE these medications    acetaminophen 500 MG tablet Commonly known as: TYLENOL Take 1,000 mg by mouth every 6 (six) hours as needed for mild pain (pain score 1-3), moderate pain (pain score 4-6) or headache.   albuterol (2.5 MG/3ML) 0.083% nebulizer solution Commonly known as: PROVENTIL Take 2.5 mg by nebulization every 6 (six) hours as needed for wheezing or shortness of breath.   albuterol 108 (90 Base) MCG/ACT inhaler Commonly known as: VENTOLIN HFA Inhale 2 puffs into the lungs every 4 (four) hours as needed for wheezing or shortness of breath.   azelastine 0.1 % nasal spray Commonly known as: ASTELIN Place 1 spray into both nostrils 2 (two) times daily as needed for allergies. Use in each nostril as directed   bisacodyl 5 MG EC tablet Commonly known as: DULCOLAX Take 2 tablets (10 mg total) by mouth daily as needed for moderate constipation.   buPROPion 300 MG 24 hr tablet Commonly known as: WELLBUTRIN XL Take 300 mg by mouth every morning.   busPIRone 15 MG tablet Commonly known as: BUSPAR Take 15 mg by mouth in the morning.   Carboxymethylcellulose Sod PF 0.5 % Soln Place 1 drop into both eyes 4 (four) times daily as needed (dry eyes).   cholecalciferol 25 MCG (1000 UNIT) tablet Commonly known as: VITAMIN D3 Take 1,000 Units by mouth  daily.   cyclobenzaprine 10 MG tablet Commonly known as: FLEXERIL Take 20 mg by mouth at bedtime.   cycloSPORINE 0.05 % ophthalmic emulsion Commonly known as: RESTASIS Place 1 drop into both eyes every 12 (twelve) hours.   diltiazem 120 MG 24 hr capsule Commonly known as: CARDIZEM CD Take 1 capsule (120 mg total) by mouth daily. What changed:  medication strength how much to take when to take this   docusate sodium 100 MG capsule Commonly known as: COLACE Take 200 mg by mouth 2 (two) times daily.   DULoxetine 60 MG capsule Commonly known as: CYMBALTA Take 60 mg by mouth 2 (two) times daily.   gabapentin 100 MG capsule Commonly known as: NEURONTIN Take 100 mg by mouth 3 (three) times daily.   LACTULOSE PO Take 2 Scoops by mouth 2 (two) times daily as needed. 2 tablespoonfuls   Melatonin 3 MG Caps Take 6 mg by mouth at bedtime.   omeprazole 20 MG capsule Commonly known as: PRILOSEC Take 1 capsule (20 mg total) by mouth daily. What changed:  when to take this reasons to take this   ondansetron 4 MG disintegrating tablet Commonly known as: ZOFRAN-ODT Take 1 tablet (4 mg total) by mouth every 8 (eight) hours as needed for nausea or vomiting.   Oxcarbazepine 300 MG tablet Commonly known as: TRILEPTAL Take 600 mg by mouth in the morning, at noon, and at bedtime.   OXYGEN Inhale 6 L/min into the lungs daily.   psyllium 95 % Pack Commonly known as: HYDROCIL/METAMUCIL Take 1 packet by mouth daily.   senna 8.6 MG Tabs tablet Commonly known as: SENOKOT Take 2 tablets (17.2 mg total) by mouth 2 (two) times daily.   sodium chloride 0.65 % Soln nasal spray Commonly known as: OCEAN Place 1 spray into both nostrils daily at 2 am. What changed:  when to take this  reasons to take this   tamsulosin 0.4 MG Caps capsule Commonly known as: FLOMAX Take 0.8 mg by mouth daily.   Tiotropium Bromide-Olodaterol 2.5-2.5 MCG/ACT Aers Inhale 2 each into the lungs every  morning. 2 puffs   traZODone 50 MG tablet Commonly known as: DESYREL Take 75 mg by mouth at bedtime.        Allergies  Allergen Reactions   Demerol [Meperidine] Nausea And Vomiting and Other (See Comments)    Made the patient "violently sick"   Zocor [Simvastatin] Nausea And Vomiting and Other (See Comments)    Made him very jittery, also   Beet [Beta Vulgaris] Nausea And Vomiting   Liver Nausea And Vomiting    Any Liver    Discharge Exam: Vitals:   05/24/23 2041 05/25/23 0526  BP:  137/68  Pulse:  87  Resp:  20  Temp:  97.8 F (36.6 C)  SpO2: 92% 94%    Physical Exam Vitals and nursing note reviewed.  Constitutional:      Comments: Pt in bathroom  Pulmonary:     Comments: Talking in full sentences. No breathlessness noted. Neurological:     Mental Status: He is alert.     The results of significant diagnostics from this hospitalization (including imaging, microbiology, ancillary and laboratory) are listed below for reference.    Microbiology: Recent Results (from the past 240 hours)  Resp panel by RT-PCR (RSV, Flu A&B, Covid) Anterior Nasal Swab     Status: None   Collection Time: 05/16/23  3:31 PM   Specimen: Anterior Nasal Swab  Result Value Ref Range Status   SARS Coronavirus 2 by RT PCR NEGATIVE NEGATIVE Final    Comment: (NOTE) SARS-CoV-2 target nucleic acids are NOT DETECTED.  The SARS-CoV-2 RNA is generally detectable in upper respiratory specimens during the acute phase of infection. The lowest concentration of SARS-CoV-2 viral copies this assay can detect is 138 copies/mL. A negative result does not preclude SARS-Cov-2 infection and should not be used as the sole basis for treatment or other patient management decisions. A negative result may occur with  improper specimen collection/handling, submission of specimen other than nasopharyngeal swab, presence of viral mutation(s) within the areas targeted by this assay, and inadequate number of  viral copies(<138 copies/mL). A negative result must be combined with clinical observations, patient history, and epidemiological information. The expected result is Negative.  Fact Sheet for Patients:  BloggerCourse.com  Fact Sheet for Healthcare Providers:  SeriousBroker.it  This test is no t yet approved or cleared by the [place] FDA and  has been authorized for detection and/or diagnosis of SARS-CoV-2 by FDA under an Emergency Use Authorization (EUA). This EUA will remain  in effect (meaning this test can be used) for the duration of the COVID-19 declaration under Section 564(b)(1) of the Act, 21 U.S.C.section 360bbb-3(b)(1), unless the authorization is terminated  or revoked sooner.       Influenza A by PCR NEGATIVE NEGATIVE Final   Influenza B by PCR NEGATIVE NEGATIVE Final    Comment: (NOTE) The Xpert Xpress SARS-CoV-2/FLU/RSV plus assay is intended as an aid in the diagnosis of influenza from Nasopharyngeal swab specimens and should not be used as a sole basis for treatment. Nasal washings and aspirates are unacceptable for Xpert Xpress SARS-CoV-2/FLU/RSV testing.  Fact Sheet for Patients: BloggerCourse.com  Fact Sheet for Healthcare Providers: SeriousBroker.it  This test is not yet approved or cleared by the [place] FDA and has been authorized for detection and/or diagnosis  of SARS-CoV-2 by FDA under an Emergency Use Authorization (EUA). This EUA will remain in effect (meaning this test can be used) for the duration of the COVID-19 declaration under Section 564(b)(1) of the Act, 21 U.S.C. section 360bbb-3(b)(1), unless the authorization is terminated or revoked.     Resp Syncytial Virus by PCR NEGATIVE NEGATIVE Final    Comment: (NOTE) Fact Sheet for Patients: BloggerCourse.com  Fact Sheet for Healthcare  Providers: SeriousBroker.it  This test is not yet approved or cleared by the [place] FDA and has been authorized for detection and/or diagnosis of SARS-CoV-2 by FDA under an Emergency Use Authorization (EUA). This EUA will remain in effect (meaning this test can be used) for the duration of the COVID-19 declaration under Section 564(b)(1) of the Act, 21 U.S.C. section 360bbb-3(b)(1), unless the authorization is terminated or revoked.  Performed at [facility], [address] [place], [place] [address]   Resp panel by RT-PCR (RSV, Flu A&B, Covid) Anterior Nasal Swab     Status: None   Collection Time: 05/17/23  8:12 PM   Specimen: Anterior Nasal Swab  Result Value Ref Range Status   SARS Coronavirus 2 by RT PCR NEGATIVE NEGATIVE Final    Comment: (NOTE) SARS-CoV-2 target nucleic acids are NOT DETECTED.  The SARS-CoV-2 RNA is generally detectable in upper respiratory specimens during the acute phase of infection. The lowest concentration of SARS-CoV-2 viral copies this assay can detect is 138 copies/mL. A negative result does not preclude SARS-Cov-2 infection and should not be used as the sole basis for treatment or other patient management decisions. A negative result may occur with  improper specimen collection/handling, submission of specimen other than nasopharyngeal swab, presence of viral mutation(s) within the areas targeted by this assay, and inadequate number of viral copies(<138 copies/mL). A negative result must be combined with clinical observations, patient history, and epidemiological information. The expected result is Negative.  Fact Sheet for Patients:  BloggerCourse.com  Fact Sheet for Healthcare Providers:  SeriousBroker.it  This test is no t yet approved or cleared by the [place] FDA and  has been authorized for detection and/or diagnosis of SARS-CoV-2  by FDA under an Emergency Use Authorization (EUA). This EUA will remain  in effect (meaning this test can be used) for the duration of the COVID-19 declaration under Section 564(b)(1) of the Act, 21 U.S.C.section 360bbb-3(b)(1), unless the authorization is terminated  or revoked sooner.       Influenza A by PCR NEGATIVE NEGATIVE Final   Influenza B by PCR NEGATIVE NEGATIVE Final    Comment: (NOTE) The Xpert Xpress SARS-CoV-2/FLU/RSV plus assay is intended as an aid in the diagnosis of influenza from Nasopharyngeal swab specimens and should not be used as a sole basis for treatment. Nasal washings and aspirates are unacceptable for Xpert Xpress SARS-CoV-2/FLU/RSV testing.  Fact Sheet for Patients: BloggerCourse.com  Fact Sheet for Healthcare Providers: SeriousBroker.it  This test is not yet approved or cleared by the [place] FDA and has been authorized for detection and/or diagnosis of SARS-CoV-2 by FDA under an Emergency Use Authorization (EUA). This EUA will remain in effect (meaning this test can be used) for the duration of the COVID-19 declaration under Section 564(b)(1) of the Act, 21 U.S.C. section 360bbb-3(b)(1), unless the authorization is terminated or revoked.     Resp Syncytial Virus by PCR NEGATIVE NEGATIVE Final    Comment: (NOTE) Fact Sheet for Patients: BloggerCourse.com  Fact Sheet for Healthcare Providers: SeriousBroker.it  This test is  not yet approved or cleared by the [place] and has been authorized for detection and/or diagnosis of SARS-CoV-2 by FDA under an Emergency Use Authorization (EUA). This EUA will remain in effect (meaning this test can be used) for the duration of the COVID-19 declaration under Section 564(b)(1) of the Act, 21 U.S.C. section 360bbb-3(b)(1), unless the authorization is terminated or revoked.  Performed at  [facility], [address] [place], [place] [address]   Respiratory (~20 pathogens) panel by PCR     Status: None   Collection Time: 05/18/23  1:37 AM   Specimen: Nasopharyngeal Swab; Respiratory  Result Value Ref Range Status   Adenovirus NOT DETECTED NOT DETECTED Final   Coronavirus 229E NOT DETECTED NOT DETECTED Final    Comment: (NOTE) The Coronavirus on the Respiratory Panel, DOES NOT test for the novel  Coronavirus (2019 nCoV)    Coronavirus HKU1 NOT DETECTED NOT DETECTED Final   Coronavirus NL63 NOT DETECTED NOT DETECTED Final   Coronavirus OC43 NOT DETECTED NOT DETECTED Final   Metapneumovirus NOT DETECTED NOT DETECTED Final   Rhinovirus / Enterovirus NOT DETECTED NOT DETECTED Final   Influenza A NOT DETECTED NOT DETECTED Final   Influenza B NOT DETECTED NOT DETECTED Final   Parainfluenza Virus 1 NOT DETECTED NOT DETECTED Final   Parainfluenza Virus 2 NOT DETECTED NOT DETECTED Final   Parainfluenza Virus 3 NOT DETECTED NOT DETECTED Final   Parainfluenza Virus 4 NOT DETECTED NOT DETECTED Final   Respiratory Syncytial Virus NOT DETECTED NOT DETECTED Final   Bordetella pertussis NOT DETECTED NOT DETECTED Final   Bordetella Parapertussis NOT DETECTED NOT DETECTED Final   Chlamydophila pneumoniae NOT DETECTED NOT DETECTED Final   Mycoplasma pneumoniae NOT DETECTED NOT DETECTED Final    Comment: Performed at [facility], [address] [place], [place] [address]     Labs: BNP (last 3 results) Recent Labs    04/28/23 1840 05/16/23 1529 05/17/23 2012  BNP 45.4 41.5 46.5   Basic Metabolic Panel: Recent Labs  Lab 05/20/23 0558  NA 136  K 4.3  CL 98  CO2 30  GLUCOSE 114*  BUN 13  CREATININE 0.89  CALCIUM 8.7*   CBC: Recent Labs  Lab 05/19/23 0520  WBC 10.0  HGB 11.7*  HCT 36.8*  MCV 89.8  PLT 359   CBG: Recent Labs  Lab 05/23/23 1730 05/23/23 2125 05/24/23 0712 05/24/23 1129 05/24/23 1631  GLUCAP 79 157* 99 95 128*    Sepsis Labs Recent Labs  Lab 05/19/23 0520  WBC 10.0    Procedures/Studies: CT CERVICAL SPINE WO CONTRAST Result Date: 05/18/2023 CLINICAL DATA:  73 year old male sudden severe headache. Neck pain. EXAM: CT CERVICAL SPINE WITHOUT CONTRAST TECHNIQUE: Multidetector CT imaging of the cervical spine was performed without intravenous contrast. Multiplanar CT image reconstructions were also generated. RADIATION DOSE REDUCTION: This exam was performed according to the departmental dose-optimization program which includes automated exposure control, adjustment of the mA and/or kV according to patient size and/or use of iterative reconstruction technique. COMPARISON:  Head CT today.  Cervical spine CT 08/20/2021. FINDINGS: Alignment: Maintained cervical lordosis. Cervicothoracic junction alignment is within normal limits. Bilateral posterior element alignment is within normal limits. Skull base and vertebrae: Bone mineralization is within normal limits for age. Mild motion artifact at the skull base. Visualized skull base is intact. No atlanto-occipital dissociation. C1 and C2 appear intact and aligned. No acute osseous abnormality identified. Soft tissues and spinal canal: No prevertebral fluid or  swelling. No visible canal hematoma. Pharynx motion artifact. Negative visible noncontrast neck soft tissues. Disc levels: Chronic ankylosis through the cervicothoracic junction and into the visible upper thoracic vertebrae. Progressive hyperostosis since 2023 at C6-C7, now with bridging osteophyte and interbody ankylosis there as well. Elsewhere there is mild for age cervical spine degeneration. Upper chest: Partially visible upper thoracic ankylosis. Lung apices remarkable for moderate to severe emphysema. IMPRESSION: 1.  No acute osseous abnormality in the cervical spine. 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 Mild for age cervical spine degeneration elsewhere. 2.  Emphysema (ICD10-J43.9). Electronically Signed   By: Odessa Fleming M.D.   On: 05/18/2023 05:28   CT HEAD WO CONTRAST ( ) Result Date: 05/18/2023 CLINICAL DATA:  73 year old male sudden severe headache. Neck pain. Earlier CTA chest overnight. EXAM: CT HEAD WITHOUT CONTRAST TECHNIQUE: Contiguous axial images were obtained from the base of the skull through the vertex without intravenous contrast. RADIATION DOSE REDUCTION: This exam was performed according to the departmental dose-optimization program which includes automated exposure control, adjustment of the mA and/or kV according to patient size and/or use of iterative reconstruction technique. COMPARISON:  Brain MRI 10/12/2021.  Head CT 04/27/2023. FINDINGS: Brain: Stable cerebral volume. No midline shift, ventriculomegaly, mass effect, evidence of mass lesion, intracranial hemorrhage or evidence of cortically based acute infarction. Stable gray-white matter differentiation throughout the brain. Mild to moderate patchy bilateral white matter hypodensity is stable. Vascular: No significant residual intravascular contrast. No suspicious intracranial vascular hyperdensity. Calcified atherosclerosis at the skull base. Skull: Stable and intact.  No acute osseous abnormality identified. Sinuses/Orbits: Ongoing fluid and bubbly opacity in the left maxillary sinus, not significantly changed from last month. Remaining paranasal sinuses, bilateral tympanic cavities and mastoids are clear. Other: Stable postoperative changes to the right globe. No acute orbit or scalp soft tissue finding. IMPRESSION: 1. No acute intracranial abnormality. Stable non contrast CT appearance of mild to moderate for age white matter disease. 2. Left maxillary sinus inflammation not significantly changed. Electronically Signed   By: Odessa Fleming M.D.   On: 05/18/2023 05:25   CT Angio Chest PE W and/or Wo Contrast Result Date:  05/17/2023 CLINICAL DATA:  Shortness of breath. EXAM: CT ANGIOGRAPHY CHEST WITH CONTRAST TECHNIQUE: Multidetector CT imaging of the chest was performed using the standard protocol during bolus administration of intravenous contrast. Multiplanar CT image reconstructions and MIPs were obtained to evaluate the vascular anatomy. RADIATION DOSE REDUCTION: This exam was performed according to the departmental dose-optimization program which includes automated exposure control, adjustment of the mA and/or kV according to patient size and/or use of iterative reconstruction technique. CONTRAST:  75mL OMNIPAQUE IOHEXOL 350 MG/ML SOLN COMPARISON:  April 28, 2023 FINDINGS: Cardiovascular: There is mild calcification of the thoracic aorta, without evidence of aortic aneurysm. Satisfactory opacification of the pulmonary arteries to the segmental level. No evidence of pulmonary embolism. Normal heart size with moderate to marked severity coronary artery calcification. No pericardial effusion. Mediastinum/Nodes: Subcentimeter AP window and pretracheal lymph nodes are seen. Thyroid gland, trachea, and esophagus demonstrate no significant findings. Lungs/Pleura: There is evidence of extensive paraseptal and central lobular emphysematous lung disease. Predominantly stable, marked severity lingular and bilateral lower lobe consolidation is noted. Areas of ground-glass appearing lung parenchyma are also seen within these regions. There is a small, stable right pleural effusion. No pneumothorax is identified. Upper Abdomen: There are stable bilateral simple renal cysts. Musculoskeletal: Multilevel degenerative changes  are noted throughout the thoracic spine. Review of the MIP images confirms the above findings. IMPRESSION: 1. No evidence of pulmonary embolism. 2. 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 3. Predominantly stable, marked severity lingular and bilateral lower lobe consolidation, consistent with  multifocal pneumonia. 4. Small, stable right pleural effusion. 5. Stable bilateral simple renal cysts. No follow-up imaging is recommended. This recommendation follows ACR consensus guidelines: Management of the Incidental Renal Mass on CT: A White Paper of the ACR Incidental Findings Committee. J Am Coll Radiol 2018;15:264-273. 6. Aortic atherosclerosis. Aortic Atherosclerosis (ICD10-I70.0) and Emphysema (ICD10-J43.9). Electronically Signed   By: Aram Candela M.D.   On: 05/17/2023 23:53   DG Chest Portable 1 View Result Date: 05/16/2023 CLINICAL DATA:  COPD. EXAM: PORTABLE CHEST 1 VIEW COMPARISON:  X-ray 05/05/2023 and older. FINDINGS: Bilateral pleural effusions are seen, similar when adjusted for technique. However there is increasing opacity at the right lung base. Slightly increased as well at the left lung base. No pneumothorax. Stable cardiopericardial silhouette. Interstitial prominence. Film is under penetrated. Stable cardiac silhouette. IMPRESSION: Bilateral pleural effusions identified with some adjacent opacities. Recommend continued follow-up. Under penetrated radiograph Electronically Signed   By: Karen Kays M.D.   On: 05/16/2023 17:29   DG Chest 2 View Result Date: 05/05/2023 CLINICAL DATA:  Chest pain and dyspnea EXAM: CHEST - 2 VIEW COMPARISON:  05/02/2023 chest radiograph. FINDINGS: Stable cardiomediastinal silhouette with normal heart size. No pneumothorax. Hyperinflated lungs. Small bilateral pleural effusions are unchanged. Prominent patchy opacity throughout the left lung, slightly improved. No change in streaky patchy right lung base opacity. IMPRESSION: 1. Prominent patchy opacity throughout the left lung, slightly improved, and stable streaky patchy right lung base opacity, compatible with slightly improving multilobar pneumonia. Continued chest radiograph follow-up advised to document resolution. 2. Unchanged small bilateral pleural effusions. 3. Hyperinflated lungs, suggesting  COPD. Electronically Signed   By: Delbert Phenix M.D.   On: 05/05/2023 21:20   DG Chest 2 View Result Date: 05/02/2023 CLINICAL DATA:  Cough. EXAM: CHEST - 2 VIEW COMPARISON:  April 28, 2023. FINDINGS: Stable cardiomediastinal silhouette. Bibasilar opacities are again noted which may represent scarring, but acute superimposed edema or inflammation cannot be excluded. Small pleural effusions are noted. Bony thorax is unremarkable. IMPRESSION: Stable bibasilar opacities as noted above. Electronically Signed   By: Lupita Raider M.D.   On: 05/02/2023 18:43   CT Angio Chest PE W and/or Wo Contrast Result Date: 04/28/2023 CLINICAL DATA:  PE suspected. Shortness of breath with exertion. Headache. Low oxygen saturations. EXAM: CT ANGIOGRAPHY CHEST WITH CONTRAST TECHNIQUE: Multidetector CT imaging of the chest was performed using the standard protocol during bolus administration of intravenous contrast. Multiplanar CT image reconstructions and MIPs were obtained to evaluate the vascular anatomy. RADIATION DOSE REDUCTION: This exam was performed according to the departmental dose-optimization program which includes automated exposure control, adjustment of the mA and/or kV according to patient size and/or use of iterative reconstruction technique. CONTRAST:  75mL OMNIPAQUE IOHEXOL 350 MG/ML SOLN COMPARISON:  Same day chest radiograph and CT chest 04/06/2023 FINDINGS: Cardiovascular: Negative for acute pulmonary embolism. Coronary artery and aortic atherosclerotic calcification. No pericardial effusion. Mediastinum/Nodes: Trachea is patent. There is layering debris in the left mainstem bronchus. Unremarkable esophagus. No thoracic adenopathy. Lungs/Pleura: Centrilobular and paraseptal emphysema greatest in the upper lobes. Consolidative and ground-glass opacities in the lingula and left lower lobe are similar to prior. Increased consolidation and ground-glass opacities in the right lower lobe compared with 04/06/2023.  Increased small right pleural effusion and similar small left pleural effusion. Bronchial wall thickening greatest in the lower lungs. No pneumothorax. Upper Abdomen: No acute abnormality. Musculoskeletal: No acute fracture. Chronic left lateral seventh and eighth rib fractures. Thoracic spondylosis with fusion of anterior osteophytes. Review of the MIP images confirms the above findings. IMPRESSION: 1. Negative for acute pulmonary embolism. 2. Increased consolidation and ground-glass opacities in the right lower lobe compared with 04/06/2023. Findings favor worsening pneumonia. 3. Similar consolidative and ground-glass opacities in the lingula and left lower lobe. Differential considerations include pneumonia, radiation fibrosis, or malignancy given history of lung cancer. 4. Increased small right pleural effusion and similar small left pleural effusion. 5. Debris in the left mainstem bronchus.  Query aspiration. Electronically Signed   By: Minerva Fester M.D.   On: 04/28/2023 21:22   DG Chest Portable 1 View Result Date: 04/28/2023 CLINICAL DATA:  Shortness of breath.  Headache. EXAM: PORTABLE CHEST 1 VIEW COMPARISON:  Chest radiograph dated 04/24/2023 and CT dated 04/06/2023 FINDINGS: Background of emphysema and chronic interstitial coarsening. Bibasilar airspace densities similar to prior radiograph and CT. There is a small right pleural effusion. No pneumothorax. Stable cardiac silhouette no acute osseous pathology. IMPRESSION: No interval change since the prior radiograph. Electronically Signed   By: Elgie Collard M.D.   On: 04/28/2023 18:36   CT Head Wo Contrast Result Date: 04/27/2023 CLINICAL DATA:  Left-sided headaches for weeks, initial encounter EXAM: CT HEAD WITHOUT CONTRAST TECHNIQUE: Contiguous axial images were obtained from the base of the skull through the vertex without intravenous contrast. RADIATION DOSE REDUCTION: This exam was performed according to the departmental dose-optimization  program which includes automated exposure control, adjustment of the mA and/or kV according to patient size and/or use of iterative reconstruction technique. COMPARISON:  10/12/2021 FINDINGS: Brain: No evidence of acute infarction, hemorrhage, hydrocephalus, extra-axial collection or mass lesion/mass effect. Chronic atrophic changes and white matter ischemic changes noted. Vascular: No hyperdense vessel or unexpected calcification. Skull: Normal. Negative for fracture or focal lesion. Sinuses/Orbits: No acute finding. Other: None. IMPRESSION: Chronic atrophic and ischemic changes.  No acute abnormality noted. Electronically Signed   By: Alcide Clever M.D.   On: 04/27/2023 02:57    Time coordinating discharge: 45 mins  SIGNED:  Carollee Herter, DO [facility] 05/25/23, 7:25 AM

## 2023-05-26 LAB — CBC
HCT: 38.3 % — ABNORMAL LOW (ref 39.0–52.0)
Hemoglobin: 12.5 g/dL — ABNORMAL LOW (ref 13.0–17.0)
MCH: 28.8 pg (ref 26.0–34.0)
MCHC: 32.6 g/dL (ref 30.0–36.0)
MCV: 88.2 fL (ref 80.0–100.0)
Platelets: 463 10*3/uL — ABNORMAL HIGH (ref 150–400)
RBC: 4.34 MIL/uL (ref 4.22–5.81)
RDW: 13.2 % (ref 11.5–15.5)
WBC: 12.2 10*3/uL — ABNORMAL HIGH (ref 4.0–10.5)
nRBC: 0 % (ref 0.0–0.2)

## 2023-05-26 LAB — COMPREHENSIVE METABOLIC PANEL WITH GFR
ALT: 24 U/L (ref 0–44)
AST: 35 U/L (ref 15–41)
Albumin: 3.4 g/dL — ABNORMAL LOW (ref 3.5–5.0)
Alkaline Phosphatase: 68 U/L (ref 38–126)
Anion gap: 12 (ref 5–15)
BUN: 11 mg/dL (ref 8–23)
CO2: 23 mmol/L (ref 22–32)
Calcium: 9.1 mg/dL (ref 8.9–10.3)
Chloride: 98 mmol/L (ref 98–111)
Creatinine, Ser: 0.85 mg/dL (ref 0.61–1.24)
GFR, Estimated: >60 mL/min (ref >60)
Glucose, Bld: 113 mg/dL — ABNORMAL HIGH (ref 70–99)
Potassium: 4.3 mmol/L (ref 3.5–5.1)
Sodium: 133 mmol/L — ABNORMAL LOW (ref 135–145)
Total Bilirubin: 0.7 mg/dL (ref 0.0–1.2)
Total Protein: 6.8 g/dL (ref 6.5–8.1)

## 2023-05-26 LAB — RESP PANEL BY RT-PCR (RSV, FLU A&B, COVID)  RVPGX2
Influenza A by PCR: NEGATIVE
Influenza B by PCR: NEGATIVE
Resp Syncytial Virus by PCR: NEGATIVE
SARS Coronavirus 2 by RT PCR: NEGATIVE

## 2023-05-26 MED ORDER — ONDANSETRON HCL 4 MG/2ML IJ SOLN
4.0000 mg | Freq: Once | INTRAMUSCULAR | Status: AC
  Administered 2023-05-26: 4 mg via INTRAVENOUS
  Filled 2023-05-26: qty 2

## 2023-05-26 NOTE — ED Notes (Signed)
 Pt walked down the hallway oxygen level got down to 88% but went back up to 95% while walking. Highest HR was 112 provider made aware.

## 2023-05-26 NOTE — ED Notes (Signed)
 Pt is up for discharge at this time. Pt is calling his roommate for a ride home and to bring his portable oxygen at this time.

## 2023-05-30 ENCOUNTER — Other Ambulatory Visit (HOSPITAL_COMMUNITY): Payer: Self-pay

## 2023-06-02 LAB — ACID FAST CULTURE WITH REFLEXED SENSITIVITIES (MYCOBACTERIA): Acid Fast Culture: NEGATIVE

## 2023-06-08 NOTE — Progress Notes (Signed)
 Requested biopsy results and notes from pt's recent admission and oncology consult with Dr. [name] faxed to [facility] Pilot, [place] [facility] at [phone].

## 2023-06-10 ENCOUNTER — Emergency Department (HOSPITAL_COMMUNITY)

## 2023-06-10 ENCOUNTER — Other Ambulatory Visit: Payer: Self-pay

## 2023-06-10 ENCOUNTER — Encounter (HOSPITAL_COMMUNITY): Payer: Self-pay

## 2023-06-10 ENCOUNTER — Emergency Department (HOSPITAL_COMMUNITY)
Admission: EM | Admit: 2023-06-10 | Discharge: 2023-06-11 | Disposition: A | Discharge status: Home / Self Care | Attending: Emergency Medicine | Admitting: Emergency Medicine

## 2023-06-10 DIAGNOSIS — I10 Essential (primary) hypertension: Secondary | ICD-10-CM | POA: Diagnosis not present

## 2023-06-10 DIAGNOSIS — C3492 Malignant neoplasm of unspecified part of left bronchus or lung: Secondary | ICD-10-CM | POA: Insufficient documentation

## 2023-06-10 DIAGNOSIS — E119 Type 2 diabetes mellitus without complications: Secondary | ICD-10-CM | POA: Diagnosis not present

## 2023-06-10 DIAGNOSIS — Z79899 Other long term (current) drug therapy: Secondary | ICD-10-CM | POA: Diagnosis not present

## 2023-06-10 DIAGNOSIS — J441 Chronic obstructive pulmonary disease with (acute) exacerbation: Secondary | ICD-10-CM | POA: Insufficient documentation

## 2023-06-10 DIAGNOSIS — R0602 Shortness of breath: Secondary | ICD-10-CM | POA: Diagnosis present

## 2023-06-10 DIAGNOSIS — R0902 Hypoxemia: Secondary | ICD-10-CM

## 2023-06-10 LAB — CBC WITH DIFFERENTIAL/PLATELET
Abs Immature Granulocytes: 0.08 10*3/uL — ABNORMAL HIGH (ref 0.00–0.07)
Basophils Absolute: 0.1 10*3/uL (ref 0.0–0.1)
Basophils Relative: 1 %
Eosinophils Absolute: 0.6 10*3/uL — ABNORMAL HIGH (ref 0.0–0.5)
Eosinophils Relative: 4 %
HCT: 37.5 % — ABNORMAL LOW (ref 39.0–52.0)
Hemoglobin: 11.7 g/dL — ABNORMAL LOW (ref 13.0–17.0)
Immature Granulocytes: 1 %
Lymphocytes Relative: 12 %
Lymphs Abs: 1.7 10*3/uL (ref 0.7–4.0)
MCH: 28 pg (ref 26.0–34.0)
MCHC: 31.2 g/dL (ref 30.0–36.0)
MCV: 89.7 fL (ref 80.0–100.0)
Monocytes Absolute: 0.8 10*3/uL (ref 0.1–1.0)
Monocytes Relative: 6 %
Neutro Abs: 11 10*3/uL — ABNORMAL HIGH (ref 1.7–7.7)
Neutrophils Relative %: 76 %
Platelets: 440 10*3/uL — ABNORMAL HIGH (ref 150–400)
RBC: 4.18 MIL/uL — ABNORMAL LOW (ref 4.22–5.81)
RDW: 14.6 % (ref 11.5–15.5)
WBC: 14.3 10*3/uL — ABNORMAL HIGH (ref 4.0–10.5)
nRBC: 0 % (ref 0.0–0.2)

## 2023-06-10 LAB — COMPREHENSIVE METABOLIC PANEL WITH GFR
ALT: 18 U/L (ref 0–44)
AST: 22 U/L (ref 15–41)
Albumin: 3.4 g/dL — ABNORMAL LOW (ref 3.5–5.0)
Alkaline Phosphatase: 69 U/L (ref 38–126)
Anion gap: 9 (ref 5–15)
BUN: 20 mg/dL (ref 8–23)
CO2: 28 mmol/L (ref 22–32)
Calcium: 9 mg/dL (ref 8.9–10.3)
Chloride: 102 mmol/L (ref 98–111)
Creatinine, Ser: 1.05 mg/dL (ref 0.61–1.24)
GFR, Estimated: >60 mL/min (ref >60)
Glucose, Bld: 111 mg/dL — ABNORMAL HIGH (ref 70–99)
Potassium: 3.9 mmol/L (ref 3.5–5.1)
Sodium: 139 mmol/L (ref 135–145)
Total Bilirubin: 0.2 mg/dL (ref 0.0–1.2)
Total Protein: 6.8 g/dL (ref 6.5–8.1)

## 2023-06-10 LAB — BLOOD GAS, VENOUS
Acid-Base Excess: 5.1 mmol/L — ABNORMAL HIGH (ref 0.0–2.0)
Bicarbonate: 31 mmol/L — ABNORMAL HIGH (ref 20.0–28.0)
O2 Saturation: 53.5 %
Patient temperature: 37
pCO2, Ven: 50 mmHg (ref 44–60)
pH, Ven: 7.4 (ref 7.25–7.43)
pO2, Ven: 33 mmHg (ref 32–45)

## 2023-06-10 LAB — PROCALCITONIN: Procalcitonin: <0.1 ng/mL

## 2023-06-10 MED ORDER — IOHEXOL 350 MG/ML SOLN
75.0000 mL | Freq: Once | INTRAVENOUS | Status: AC | PRN
  Administered 2023-06-10: 75 mL via INTRAVENOUS

## 2023-06-10 MED ORDER — ALBUTEROL SULFATE HFA 108 (90 BASE) MCG/ACT IN AERS: 2.0000 | INHALATION_SPRAY | RESPIRATORY_TRACT | Status: DC | PRN

## 2023-06-10 MED ORDER — ONDANSETRON 4 MG PO TBDP: 4.0000 mg | ORAL_TABLET | Freq: Once | ORAL | Status: DC

## 2023-06-10 NOTE — ED Triage Notes (Signed)
 Pt BIB EMS with reports of shob and o2 in the 80s after walking around. Pt is on 6 L per baseline.Pt is at 95% on 6 L. Wheezing per ems. Douneb and solumedrol 125 given. IV 20 g left ac.

## 2023-06-10 NOTE — Discharge Instructions (Signed)
 No evidence of any significant bacterial infection.  So continue your oxygen.  Follow-up with [facility] for your oncology appointment for the treatment of the recurrent lung cancer.  Return for any new or worse symptoms.

## 2023-06-10 NOTE — ED Notes (Signed)
 Procal added on by calling lab

## 2023-06-10 NOTE — ED Provider Notes (Addendum)
 [place] EMERGENCY DEPARTMENT AT [facility] Provider Note   CSN: [id number] Arrival date & time: 06/10/23  1656     History  Chief Complaint  Patient presents with   Shortness of Breath    Mario [name] is a 73 y.o. male.  Patient normally on 6-1/2 L of oxygen at all times.  Today patient with any kind of walking his oxygen sats got down into the low 80s.  He had difficulty recovering.  So EMS was called.  EMS stated that he had bilateral wheezing they gave a DuoNeb and gave Solu-Medrol.  Patient was last evaluated in the emergency department March 27 through 28.  Did improve with his breathing at that time and was discharged home.  He was admitted March 19 through March 27.  At that time patient's CODE STATUS was DNR/DNI.  At that time they recommended oxygen 5 L/min.  He was admitted for acute on chronic respiratory failure.  At that time they noted the patient has a history of recurrent hospitalizations with acute on chronic hypoxic respiratory failure secondary right lower lobe pneumonia and COPD exacerbation.  Also has multiple medical problems.  Patient did have CTPA no evidence of pulmonary embolus extensive paraseptal and central lobar emphysematous lung disease.  Predominantly stable marked severity lingular bilateral lobe consolidation consistent with multifocal pneumonia and a small right pleural effusion.  During that workup patient was found to have a recurrence of his non-small cancer left lung was seen by oncology.  The patient opted to have his cancer treatment at the [place].  But he has not started that yet.  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  Metastatic carcinoma hypertension type 2 diabetes COPD history of pulmonary embolus.  Patient is on Eliquis her was on Eliquis that was stopped due to ongoing hemoptysis and epistasis at that time Eliquis was stopped indefinitely.       Home  Medications Prior to Admission medications   Medication Sig Start Date End Date Taking? Authorizing Provider  acetaminophen (TYLENOL) 500 MG tablet Take 1,000 mg by mouth every 6 (six) hours as needed for mild pain (pain score 1-3), moderate pain (pain score 4-6) or headache.    [provider]  albuterol (PROVENTIL) (2.5 MG/3ML) 0.083% nebulizer solution Take 2.5 mg by nebulization every 6 (six) hours as needed for wheezing or shortness of breath.    [provider]  albuterol (VENTOLIN HFA) 108 (90 Base) MCG/ACT inhaler Inhale 2 puffs into the lungs every 4 (four) hours as needed for wheezing or shortness of breath. 11/04/21   Audria Leather, MD  azelastine (ASTELIN) 0.1 % nasal spray Place 1 spray into both nostrils 2 (two) times daily as needed for allergies. Use in each nostril as directed    [provider]  bisacodyl (DULCOLAX) 5 MG EC tablet Take 2 tablets (10 mg total) by mouth daily as needed for moderate constipation. 01/26/23   Wynetta Heckle, MD  buPROPion (WELLBUTRIN XL) 300 MG 24 hr tablet Take 300 mg by mouth every morning. 02/05/20   [provider]  busPIRone (BUSPAR) 15 MG tablet Take 15 mg by mouth in the morning.    [provider]  Carboxymethylcellulose Sod PF 0.5 % SOLN Place 1 drop into both eyes 4 (four) times daily as needed (dry eyes).    [provider]  cholecalciferol (VITAMIN D3) 25 MCG (1000 UNIT) tablet Take 1,000 Units by mouth  daily.    [provider]  cyclobenzaprine (FLEXERIL) 10 MG tablet Take 20 mg by mouth at bedtime.    [provider]  cycloSPORINE (RESTASIS) 0.05 % ophthalmic emulsion Place 1 drop into both eyes every 12 (twelve) hours.    [provider]  diltiazem (CARDIZEM CD) 120 MG 24 hr capsule Take 1 capsule (120 mg total) by mouth daily. 05/25/23 08/23/23  Unk Garb, DO  docusate sodium (COLACE) 100 MG capsule Take 200 mg by mouth 2 (two) times daily.    [provider]  DULoxetine (CYMBALTA) 60 MG capsule Take 60 mg by mouth 2 (two) times daily.    [provider]  gabapentin (NEURONTIN) 100 MG capsule Take 100 mg by mouth 3 (three) times daily.    [provider]  LACTULOSE PO Take 2 Scoops by mouth 2 (two) times daily as needed. 2 tablespoonfuls    [provider]  Melatonin 3 MG CAPS Take 6 mg by mouth at bedtime.    [provider]  omeprazole (PRILOSEC) 20 MG capsule Take 1 capsule (20 mg total) by mouth daily. Patient taking differently: Take 20 mg by mouth daily as needed. 01/26/23   Wynetta Heckle, MD  ondansetron (ZOFRAN-ODT) 4 MG disintegrating tablet Take 1 tablet (4 mg total) by mouth every 8 (eight) hours as needed for nausea or vomiting. 05/23/23   Barbee Lew, MD  Oxcarbazepine (TRILEPTAL) 300 MG tablet Take 600 mg by mouth in the morning, at noon, and at bedtime.    [provider]  OXYGEN Inhale 6 L/min into the lungs daily.    [provider]  psyllium (HYDROCIL/METAMUCIL) 95 % PACK Take 1 packet by mouth daily. Patient not taking: Reported on 04/26/2023 04/02/23   Gherghe, Costin M, MD  senna (SENOKOT) 8.6 MG TABS tablet Take 2 tablets (17.2 mg total) by mouth 2 (two) times daily. 05/23/23   Barbee Lew, MD  sodium chloride (OCEAN) 0.65 % SOLN nasal spray Place 1 spray into both nostrils daily at 2 am. Patient taking differently: Place 1 spray into both nostrils daily as needed for congestion. 04/24/23   Barbee Lew, MD  tamsulosin (FLOMAX) 0.4 MG CAPS capsule Take 0.8 mg by mouth daily.    [provider]  Tiotropium Bromide-Olodaterol 2.5-2.5 MCG/ACT AERS Inhale 2 each into the lungs every morning. 2 puffs    [provider]  traZODone (DESYREL) 50 MG tablet Take 75 mg by mouth at bedtime.    [provider]      Allergies    Demerol [meperidine], Zocor [simvastatin], Beet [beta vulgaris], and Liver    Review of Systems    Review of Systems  Respiratory:  Positive for shortness of breath and wheezing.     Physical Exam Updated Vital Signs BP 119/65   Pulse (!) 104   Temp 98.2 F (36.8 C) (Oral)   Resp 18   Ht 1.88 m (6\' 2" )   Wt 122.5 kg   SpO2 95%   BMI 34.67 kg/m  Physical Exam Vitals and nursing note reviewed.  Constitutional:      General: He is not in acute distress.    Appearance: He is well-developed.  HENT:     Head: Normocephalic and atraumatic.  Eyes:     Conjunctiva/sclera: Conjunctivae normal.  Cardiovascular:     Rate and Rhythm: Normal rate and regular rhythm.     Heart sounds: No murmur heard. Pulmonary:     Effort: Pulmonary  effort is normal. No respiratory distress.     Breath sounds: Normal breath sounds. No wheezing, rhonchi or rales.  Abdominal:     Palpations: Abdomen is soft.     Tenderness: There is no abdominal tenderness.  Musculoskeletal:        General: No swelling.     Cervical back: Neck supple.  Skin:    General: Skin is warm and dry.     Capillary Refill: Capillary refill takes less than 2 seconds.  Neurological:     Mental Status: He is alert.  Psychiatric:        Mood and Affect: Mood normal.     ED Results / Procedures / Treatments   Labs (all labs ordered are listed, but only abnormal results are displayed) Labs Reviewed  BLOOD GAS, VENOUS - Abnormal; Notable for the following components:      Result Value   Bicarbonate 31.0 (*)    Acid-Base Excess 5.1 (*)    All other components within normal limits  CBC WITH DIFFERENTIAL/PLATELET - Abnormal; Notable for the following components:   WBC 14.3 (*)    RBC 4.18 (*)    Hemoglobin 11.7 (*)    HCT 37.5 (*)    Platelets 440 (*)    Neutro Abs 11.0 (*)    Eosinophils Absolute 0.6 (*)    Abs Immature Granulocytes 0.08 (*)    All other components within normal limits  COMPREHENSIVE METABOLIC PANEL WITH GFR - Abnormal; Notable for the following components:   Glucose, Bld 111 (*)    Albumin 3.4  (*)    All other components within normal limits  PROCALCITONIN  PROCALCITONIN    EKG EKG Interpretation Date/Time:  Saturday June 10 2023 17:17:23 EDT Ventricular Rate:  102 PR Interval:  182 QRS Duration:  121 QT Interval:  338 QTC Calculation: 441 R Axis:   -20  Text Interpretation: Sinus tachycardia Nonspecific intraventricular conduction delay No significant change since last tracing Confirmed by Brya Simerly [phone]) on 06/10/2023 5:26:54 PM  Radiology CT Angio Chest PE W/Cm &/Or Wo Cm Result Date: 06/10/2023 CLINICAL DATA:  Pulmonary embolism (PE) suspected, high prob Also history of multifocal pneumonia COPD and recurrent lung cancer left lower lobe. EXAM: CT ANGIOGRAPHY CHEST WITH CONTRAST TECHNIQUE: Multidetector CT imaging of the chest was performed using the standard protocol during bolus administration of intravenous contrast. Multiplanar CT image reconstructions and MIPs were obtained to evaluate the vascular anatomy. RADIATION DOSE REDUCTION: This exam was performed according to the departmental dose-optimization program which includes automated exposure control, adjustment of the mA and/or kV according to patient size and/or use of iterative reconstruction technique. CONTRAST:  75mL OMNIPAQUE IOHEXOL 350 MG/ML SOLN COMPARISON:  CT angio chest 05/17/2023. CT angiography chest 08/26/2023, CT angio chest 08/23/2021 FINDINGS: Cardiovascular: Satisfactory opacification of the pulmonary arteries to the segmental level. No evidence of pulmonary embolism. Limited evaluation of the subsegmental level. Normal heart size. No significant pericardial effusion. The thoracic aorta is normal in caliber. Moderate to severe atherosclerotic plaque of the thoracic aorta. Four-vessel coronary artery calcifications. Mediastinum/Nodes: No enlarged mediastinal, hilar, or axillary lymph nodes. Thyroid gland, trachea, and esophagus demonstrate no significant findings. Lungs/Pleura: Extensive paraseptal  and centrilobular emphysematous changes. Similar-appearing extensive lingular and lower lung zone predominant ground-glass and consolidative opacities. No pulmonary nodule. No pulmonary mass. Interval decrease of trace right pleural effusion. No pneumothorax. Upper Abdomen: Right renal 2.6 cm hypodense lesion with a density of 30 Hounsfield units. Musculoskeletal: No chest wall abnormality. No suspicious  lytic or blastic osseous lesions. No acute displaced fracture. Multilevel degenerative changes of the spine. Review of the MIP images confirms the above findings. IMPRESSION: 1. No pulmonary embolus. Limited of subsegmental level due to motion artifact and lung changes. 2. Similar-appearing extensive lingular and lower lung zone predominant ground-glass and consolidative opacities. Finding may represent a combination of infection/relation and malignancy. 3.  Interval decrease of trace right pleural effusion. 4. Extensive emphysema (ICD10-J43.9). 5. Aortic Atherosclerosis (ICD10-I70.0) including four-vessel coronary calcification. Electronically Signed   By: Morgane  Naveau M.D.   On: 06/10/2023 19:48   DG Chest 2 View Result Date: 06/10/2023 CLINICAL DATA:  Short of breath EXAM: CHEST - 2 VIEW COMPARISON:  05/25/2023 FINDINGS: Normal cardiac silhouette. There is bilateral airspace disease more dense on the LEFT. Small RIGHT effusion. No interval change. IMPRESSION: No interval change in bibasilar pneumonia. More dense pneumonia on the LEFT. Electronically Signed   By: Deboraha Fallow M.D.   On: 06/10/2023 17:46    Procedures Procedures    Medications Ordered in ED Medications  albuterol (VENTOLIN HFA) 108 (90 Base) MCG/ACT inhaler 2 puff (has no administration in time range)  iohexol (OMNIPAQUE) 350 MG/ML injection 75 mL (75 mLs Intravenous Contrast Given 06/10/23 1919)    ED Course/ Medical Decision Making/ A&P                                 Medical Decision Making Amount and/or Complexity of  Data Reviewed Labs: ordered. Radiology: ordered.  Risk Prescription drug management. Decision regarding hospitalization.   Patient's blood gas pH 7.4 pCO2 50 pO2 33 reassuring.  White count is higher than it has been at 14.3 hemoglobin 11.7 and platelets at 440.  Complete metabolic panel renal function normal LFTs normal.  Two-view chest shows more dense pneumonia on the left.  But that also could be where this neoplastic process is.  Patient's also had a history of PEs in the past.  Based on this we will go ahead and get CT angio chest for further evaluation and to rule out PE.  Patient currently on the 6 L is satting around 94%.  And lungs are clear no evidence of any wheezing.   Patient looking much better.  In discussion with hospitalist.  Patient's chest x-ray CT findings have been in the limb.  They went ahead and ordered procalcitonin if that is elevated then they will consider admission.  Otherwise if he remains stable probably will be able to be discharged home.  Patient's had recurrent admissions for very similar things.   Has been very stable here.  Procalcitonin is extremely normal.  In conversations with the hospitalist that that was very normal with probably not dealing with a worse pneumonia or multifocal pneumonia.  And he has been through and seen multiple times for this.  Oxygen levels have been good.  Patient was okay with going home.  Will discharge home.  Has follow-up with the [facility] for evaluation by oncology there. 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

## 2023-06-14 ENCOUNTER — Telehealth: Payer: Self-pay | Admitting: Medical Oncology

## 2023-06-14 ENCOUNTER — Inpatient Hospital Stay

## 2023-06-14 ENCOUNTER — Inpatient Hospital Stay: Admitting: Internal Medicine

## 2023-06-14 NOTE — Telephone Encounter (Signed)
 [facility] cancelled his appt today .

## 2023-07-19 ENCOUNTER — Other Ambulatory Visit: Payer: Self-pay

## 2023-07-19 ENCOUNTER — Emergency Department (HOSPITAL_COMMUNITY)
Admission: EM | Admit: 2023-07-19 | Discharge: 2023-07-20 | Disposition: A | Discharge status: Home / Self Care | Attending: Emergency Medicine | Admitting: Emergency Medicine

## 2023-07-19 ENCOUNTER — Emergency Department (HOSPITAL_COMMUNITY)

## 2023-07-19 ENCOUNTER — Encounter (HOSPITAL_COMMUNITY): Payer: Self-pay

## 2023-07-19 DIAGNOSIS — E119 Type 2 diabetes mellitus without complications: Secondary | ICD-10-CM | POA: Insufficient documentation

## 2023-07-19 DIAGNOSIS — I10 Essential (primary) hypertension: Secondary | ICD-10-CM | POA: Diagnosis present

## 2023-07-19 DIAGNOSIS — J449 Chronic obstructive pulmonary disease, unspecified: Secondary | ICD-10-CM | POA: Insufficient documentation

## 2023-07-19 DIAGNOSIS — Z9981 Dependence on supplemental oxygen: Secondary | ICD-10-CM | POA: Diagnosis not present

## 2023-07-19 DIAGNOSIS — J9611 Chronic respiratory failure with hypoxia: Secondary | ICD-10-CM | POA: Diagnosis not present

## 2023-07-19 DIAGNOSIS — Z7982 Long term (current) use of aspirin: Secondary | ICD-10-CM | POA: Diagnosis not present

## 2023-07-19 DIAGNOSIS — Z7984 Long term (current) use of oral hypoglycemic drugs: Secondary | ICD-10-CM | POA: Diagnosis not present

## 2023-07-19 DIAGNOSIS — R059 Cough, unspecified: Secondary | ICD-10-CM | POA: Insufficient documentation

## 2023-07-19 LAB — RESP PANEL BY RT-PCR (RSV, FLU A&B, COVID)  RVPGX2
Influenza A by PCR: NEGATIVE
Influenza B by PCR: NEGATIVE
Resp Syncytial Virus by PCR: NEGATIVE
SARS Coronavirus 2 by RT PCR: NEGATIVE

## 2023-07-19 LAB — COMPREHENSIVE METABOLIC PANEL WITH GFR
ALT: 15 U/L (ref 0–44)
AST: 20 U/L (ref 15–41)
Albumin: 3.5 g/dL (ref 3.5–5.0)
Alkaline Phosphatase: 110 U/L (ref 38–126)
Anion gap: 10 (ref 5–15)
BUN: 16 mg/dL (ref 8–23)
CO2: 25 mmol/L (ref 22–32)
Calcium: 8.8 mg/dL — ABNORMAL LOW (ref 8.9–10.3)
Chloride: 97 mmol/L — ABNORMAL LOW (ref 98–111)
Creatinine, Ser: 0.82 mg/dL (ref 0.61–1.24)
GFR, Estimated: >60 mL/min (ref >60)
Glucose, Bld: 100 mg/dL — ABNORMAL HIGH (ref 70–99)
Potassium: 4.3 mmol/L (ref 3.5–5.1)
Sodium: 132 mmol/L — ABNORMAL LOW (ref 135–145)
Total Bilirubin: 0.4 mg/dL (ref 0.0–1.2)
Total Protein: 7.4 g/dL (ref 6.5–8.1)

## 2023-07-19 LAB — TROPONIN I (HIGH SENSITIVITY)
Troponin I (High Sensitivity): 10 ng/L (ref <18)
Troponin I (High Sensitivity): 10 ng/L (ref <18)

## 2023-07-19 LAB — I-STAT CHEM 8, ED
BUN: 16 mg/dL (ref 8–23)
Calcium, Ion: 1.14 mmol/L — ABNORMAL LOW (ref 1.15–1.40)
Chloride: 100 mmol/L (ref 98–111)
Creatinine, Ser: 1 mg/dL (ref 0.61–1.24)
Glucose, Bld: 99 mg/dL (ref 70–99)
HCT: 43 % (ref 39.0–52.0)
Hemoglobin: 14.6 g/dL (ref 13.0–17.0)
Potassium: 4.5 mmol/L (ref 3.5–5.1)
Sodium: 135 mmol/L (ref 135–145)
TCO2: 27 mmol/L (ref 22–32)

## 2023-07-19 LAB — CBC WITH DIFFERENTIAL/PLATELET
Abs Immature Granulocytes: 0.04 10*3/uL (ref 0.00–0.07)
Basophils Absolute: 0.1 10*3/uL (ref 0.0–0.1)
Basophils Relative: 1 %
Eosinophils Absolute: 0.1 10*3/uL (ref 0.0–0.5)
Eosinophils Relative: 1 %
HCT: 41.6 % (ref 39.0–52.0)
Hemoglobin: 13.4 g/dL (ref 13.0–17.0)
Immature Granulocytes: 0 %
Lymphocytes Relative: 15 %
Lymphs Abs: 1.6 10*3/uL (ref 0.7–4.0)
MCH: 28.9 pg (ref 26.0–34.0)
MCHC: 32.2 g/dL (ref 30.0–36.0)
MCV: 89.8 fL (ref 80.0–100.0)
Monocytes Absolute: 1.2 10*3/uL — ABNORMAL HIGH (ref 0.1–1.0)
Monocytes Relative: 11 %
Neutro Abs: 8 10*3/uL — ABNORMAL HIGH (ref 1.7–7.7)
Neutrophils Relative %: 72 %
Platelets: 357 10*3/uL (ref 150–400)
RBC: 4.63 MIL/uL (ref 4.22–5.81)
RDW: 16.7 % — ABNORMAL HIGH (ref 11.5–15.5)
WBC: 11.1 10*3/uL — ABNORMAL HIGH (ref 4.0–10.5)
nRBC: 0 % (ref 0.0–0.2)

## 2023-07-19 LAB — I-STAT CG4 LACTIC ACID, ED
Lactic Acid, Venous: 1.2 mmol/L (ref 0.5–1.9)
Lactic Acid, Venous: 1.4 mmol/L (ref 0.5–1.9)

## 2023-07-19 LAB — BRAIN NATRIURETIC PEPTIDE: B Natriuretic Peptide: 25.4 pg/mL (ref 0.0–100.0)

## 2023-07-19 LAB — D-DIMER, QUANTITATIVE: D-Dimer, Quant: 1.03 ug{FEU}/mL — ABNORMAL HIGH (ref 0.00–0.50)

## 2023-07-19 MED ORDER — SODIUM CHLORIDE 0.9 % IV BOLUS
500.0000 mL | Freq: Once | INTRAVENOUS | Status: AC
  Administered 2023-07-19: 500 mL via INTRAVENOUS

## 2023-07-19 MED ORDER — IPRATROPIUM-ALBUTEROL 0.5-2.5 (3) MG/3ML IN SOLN
3.0000 mL | Freq: Once | RESPIRATORY_TRACT | Status: AC
  Administered 2023-07-19: 3 mL via RESPIRATORY_TRACT
  Filled 2023-07-19: qty 3

## 2023-07-19 NOTE — ED Triage Notes (Signed)
 Pt BIB EMS from Home due to cough and SOB for the last three days; pain in chest from coughing. Pt called EMS after check HR and SpO2 at home; readings showed SpO2 in the 80s and HR of about 140. Pt uses 6.5L [place] O2 baseline at home; pt report for the last two days he placed O2 at 8L [place]. On arrival was placed on 8L non-rebreather. Pt is SOB at rest and exertion. EMS reports pt de-sated and became blue in the face on ambulation. Hx of COPD and PE. Pt reports he is DNR. Pt is AAOx4.  In Route BP 167/76 HR 110 CBG 87 SpO2 93-95% on 15L non-rebreather

## 2023-07-19 NOTE — ED Provider Notes (Signed)
 [place] EMERGENCY DEPARTMENT AT [facility] Provider Note   CSN: [id number] Arrival date & time: 07/19/23  1718     History  Chief Complaint  Patient presents with   Shortness of Breath   Hypoxia   Cough    Mario [name] is a 73 y.o. male.  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  He has been admitted several times for acute on chronic respiratory failure.  He is on chronic oxygen  at 6.5 L/min.  He says over the last couple days he has had worsening cough.  It is somewhat productive.  He has some soreness in his chest related to the coughing.  He said he had some intermittent swelling in his legs.  No known fevers.  His breathing has been worse than it typically is.  He says he gets short of breath with minimal exertion.  His heart rate also goes up.  His oxygen  saturations have been lower than normal and have been in the low 80s and it takes a while to come back up after he ambulates.  He recently turned his oxygen  up to 8 L/min yesterday.  Currently on a nonrebreather.       Home Medications Prior to Admission medications   Medication Sig Start Date End Date Taking? Authorizing Provider  acetaminophen  (TYLENOL ) 500 MG tablet Take 1,000 mg by mouth every 6 (six) hours as needed for mild pain (pain score 1-3), moderate pain (pain score 4-6) or headache.    [provider]  albuterol  (PROVENTIL ) (2.5 MG/3ML) 0.083% nebulizer solution Take 2.5 mg by nebulization every 6 (six) hours as needed for wheezing or shortness of breath.    [provider]  albuterol  (VENTOLIN  HFA) 108 (90 Base) MCG/ACT inhaler Inhale 2 puffs into the lungs every 4 (four) hours as needed for wheezing or shortness of breath. 11/04/21   Audria Leather, MD  aspirin  EC 81 MG tablet Take 81 mg by mouth daily. Swallow whole.    [provider]   azelastine  (ASTELIN ) 0.1 % nasal spray Place 1 spray into both nostrils 2 (two) times daily as needed for allergies or rhinitis (or post-nasal drip).    [provider]  bisacodyl  (DULCOLAX) 5 MG EC tablet Take 2 tablets (10 mg total) by mouth daily as needed for moderate constipation. 01/26/23   Wynetta Heckle, MD  BLACK CURRANT SEED OIL PO Take 2 capsules by mouth in the morning.    [provider]  buPROPion  (WELLBUTRIN  XL) 300 MG 24 hr tablet Take 300 mg by mouth every morning. 02/05/20   [provider]  busPIRone  (BUSPAR ) 15 MG tablet Take 15 mg by mouth in the morning.    [provider]  Calcium  Carbonate (CALCIUM  500 PO) Take 500 mg by mouth in the morning.    [provider]  Carboxymethylcellulose Sod PF 0.5 % SOLN Place 1 drop into both eyes 4 (four) times daily as needed (dry eyes).    [provider]  cholecalciferol  (VITAMIN D3) 25 MCG (1000 UNIT) tablet Take 1,000 Units by mouth daily.    [provider]  Cyanocobalamin (VITAMIN B12 PO) Take 1 tablet by mouth in the morning.    [provider]  cyclobenzaprine  (FLEXERIL ) 10 MG tablet Take 20 mg by mouth at bedtime.    [provider]  cycloSPORINE  (RESTASIS ) 0.05 % ophthalmic emulsion Place 1 drop into  both eyes every 12 (twelve) hours.    [provider]  diltiazem  (CARDIZEM  CD) 120 MG 24 hr capsule Take 1 capsule (120 mg total) by mouth daily. 05/25/23 08/23/23  Unk Garb, DO  docusate sodium  (COLACE) 100 MG capsule Take 200 mg by mouth 2 (two) times daily.    [provider]  DULoxetine  (CYMBALTA ) 60 MG capsule Take 60 mg by mouth 2 (two) times daily.    [provider]  ferrous sulfate 325 (65 FE) MG tablet Take 325 mg by mouth daily with breakfast.    [provider]  fluticasone  (FLONASE ) 50 MCG/ACT nasal spray Place 2 sprays into both nostrils in the morning.    [provider]  folic acid  (FOLVITE ) 1  MG tablet Take 1 mg by mouth daily.    [provider]  gabapentin  (NEURONTIN ) 100 MG capsule Take 100 mg by mouth 3 (three) times daily.    [provider]  GARLIC OIL PO Take 1 capsule by mouth in the morning.    [provider]  Glucerna (GLUCERNA) LIQD Take 237 mLs by mouth See admin instructions. Glucerna Therapeutic Shake chocolate- Drink 1 bottle by mouth once a day    [provider]  HYDROcodone -acetaminophen  (NORCO) 10-325 MG tablet Take 1 tablet by mouth every 6 (six) hours as needed for moderate pain (pain score 4-6).    [provider]  KRILL OIL PO Take 2 capsules by mouth daily.    [provider]  lactulose  (CHRONULAC ) 10 GM/15ML solution Take 20 g by mouth 2 (two) times daily as needed for mild constipation.    [provider]  lidocaine  (XYLOCAINE ) 5 % ointment Apply 1 Application topically 2 (two) times daily as needed (for pain- affected sites).    [provider]  magnesium  citrate SOLN Take 0.5 Bottles by mouth daily as needed for severe constipation.    [provider]  Magnesium  Oxide (MAG-OXIDE PO) Take 1 tablet by mouth daily.    [provider]  Melatonin 3 MG CAPS Take 6 mg by mouth at bedtime.    [provider]  metFORMIN  (GLUCOPHAGE ) 850 MG tablet Take 850 mg by mouth daily after breakfast.    [provider]  Misc Natural Products (TURMERIC CURCUMIN) CAPS Take 3 capsules by mouth See admin instructions. Turmeric/Bioperine/Ginger/Garlic- Take 3 capsules by mouth once a day    [provider]  Mometasone  Furoate (ASMANEX  HFA) 200 MCG/ACT AERO Inhale 2 puffs into the lungs daily as needed (for severe COPD symptoms (when tolerated) and RINSE MOUTH VERY WELL WITH WATER AFTERWARDS).    [provider]  Multiple Vitamin (MULTIVITAMIN) tablet Take 1 tablet by mouth daily with breakfast.    [provider]  NON FORMULARY Place 1 application  into  both nostrils See admin instructions. InstaCure's Original Nose Balm with Manuka Honey- Place into the affected nasal passage twice a day as needed for irritation    [provider]  NON FORMULARY Take 2-4 tablets by mouth See admin instructions. ZzzQuil PURE Zzzs Melatonin Gummies- Chew 2 to 4 gummies by mouth at bedtime    [provider]  omeprazole  (PRILOSEC) 20 MG capsule Take 1 capsule (20 mg total) by mouth daily. Patient taking differently: Take 20 mg by mouth daily as needed (for reflux symptoms). 01/26/23   Wynetta Heckle, MD  ondansetron  (ZOFRAN -ODT) 4 MG disintegrating tablet Take 1 tablet (4 mg total) by mouth every 8 (eight) hours as needed for nausea  or vomiting. Patient taking differently: Take 4 mg by mouth every 8 (eight) hours as needed for nausea or vomiting (dissolve orally). 05/23/23   Barbee Lew, MD  Oxcarbazepine  (TRILEPTAL ) 300 MG tablet Take 600 mg by mouth in the morning, at noon, and at bedtime.    [provider]  OXYGEN  Inhale 6 L/min into the lungs continuous.    [provider]  polyethylene glycol powder (GLYCOLAX /MIRALAX ) 17 GM/SCOOP powder Take 17 g by mouth in the morning.    [provider]  psyllium (HYDROCIL/METAMUCIL) 95 % PACK Take 1 packet by mouth daily. Patient not taking: Reported on 06/10/2023 04/02/23   Gherghe, Costin M, MD  senna (SENOKOT) 8.6 MG TABS tablet Take 2 tablets (17.2 mg total) by mouth 2 (two) times daily. Patient taking differently: Take 1 tablet by mouth daily. 05/23/23   Barbee Lew, MD  sodium chloride  (OCEAN) 0.65 % SOLN nasal spray Place 1 spray into both nostrils daily at 2 am. Patient taking differently: Place 1 spray into both nostrils as needed for congestion. 04/24/23   Barbee Lew, MD  tamsulosin  (FLOMAX ) 0.4 MG CAPS capsule Take 0.8 mg by mouth in the morning.    [provider]  Tiotropium Bromide -Olodaterol 2.5-2.5 MCG/ACT AERS Inhale 2 puffs into  the lungs every morning. 2 puffs    [provider]  traZODone  (DESYREL ) 50 MG tablet Take 75 mg by mouth at bedtime.    [provider]      Allergies    Demerol [meperidine], Zocor [simvastatin], Beet [beta vulgaris], and Liver    Review of Systems   Review of Systems  Constitutional:  Positive for fatigue. Negative for chills, diaphoresis and fever.  HENT:  Negative for congestion, rhinorrhea and sneezing.   Eyes: Negative.   Respiratory:  Positive for cough and shortness of breath. Negative for chest tightness.   Cardiovascular:  Positive for chest pain. Negative for leg swelling.  Gastrointestinal:  Negative for abdominal pain, blood in stool, diarrhea, nausea and vomiting.  Genitourinary:  Negative for difficulty urinating, flank pain, frequency and hematuria.  Musculoskeletal:  Negative for arthralgias and back pain.  Skin:  Negative for rash.  Neurological:  Negative for dizziness, speech difficulty, weakness, numbness and headaches.    Physical Exam Updated Vital Signs BP (!) 157/88   Pulse (!) 106   Temp 98 F (36.7 C) (Oral)   Resp (!) 21   SpO2 93%  Physical Exam Constitutional:      Appearance: He is well-developed.  HENT:     Head: Normocephalic and atraumatic.  Eyes:     Pupils: Pupils are equal, round, and reactive to light.  Cardiovascular:     Rate and Rhythm: Normal rate and regular rhythm.     Heart sounds: Normal heart sounds.  Pulmonary:     Effort: Pulmonary effort is normal. No respiratory distress.     Breath sounds: Decreased breath sounds present. No wheezing or rales.  Chest:     Chest wall: No tenderness.  Abdominal:     General: Bowel sounds are normal.     Palpations: Abdomen is soft.     Tenderness: There is no abdominal tenderness. There is no guarding or rebound.  Musculoskeletal:        General: Normal range of motion.     Cervical back: Normal range of motion and neck supple.     Comments: Trace edema to lower  extremities bilaterally  Lymphadenopathy:     Cervical:  No cervical adenopathy.  Skin:    General: Skin is warm and dry.     Findings: No rash.  Neurological:     Mental Status: He is alert and oriented to person, place, and time.     ED Results / Procedures / Treatments   Labs (all labs ordered are listed, but only abnormal results are displayed) Labs Reviewed  COMPREHENSIVE METABOLIC PANEL WITH GFR - Abnormal; Notable for the following components:      Result Value   Sodium 132 (*)    Chloride 97 (*)    Glucose, Bld 100 (*)    Calcium  8.8 (*)    All other components within normal limits  CBC WITH DIFFERENTIAL/PLATELET - Abnormal; Notable for the following components:   WBC 11.1 (*)    RDW 16.7 (*)    Neutro Abs 8.0 (*)    Monocytes Absolute 1.2 (*)    All other components within normal limits  I-STAT CHEM 8, ED - Abnormal; Notable for the following components:   Calcium , Ion 1.14 (*)    All other components within normal limits  RESP PANEL BY RT-PCR (RSV, FLU A&B, COVID)  RVPGX2  BRAIN NATRIURETIC PEPTIDE  PROCALCITONIN  I-STAT CG4 LACTIC ACID, ED  I-STAT CG4 LACTIC ACID, ED  TROPONIN I (HIGH SENSITIVITY)  TROPONIN I (HIGH SENSITIVITY)    EKG EKG Interpretation Date/Time:  Wednesday Jul 19 2023 17:34:24 EDT Ventricular Rate:  109 PR Interval:  194 QRS Duration:  116 QT Interval:  342 QTC Calculation: 461 R Axis:   -28  Text Interpretation: Sinus tachycardia Nonspecific intraventricular conduction delay Low voltage, precordial leads ST elevation, consider inferior injury Baseline wander in lead(s) II III aVL aVF V6 since last tracing no significant change Confirmed by Hershel Los [phone]) on 07/19/2023 6:04:27 PM  Radiology DG Chest 2 View Result Date: 07/19/2023 EXAM: 2 VIEW(S) XRAY OF THE CHEST 07/19/2023 06:18:00 PM COMPARISON: 2-view chest x-ray of 06/10/2023 and CT angio chest 06/10/2023. CLINICAL HISTORY: Cough. Reason for exam: shortness of breath and  chest pain x 3 days. FINDINGS: LUNGS AND PLEURA: Persistent interstitial and airspace opacities are present bilaterally, left greater than right without significant interval change. Stable bilateral effusions are also present, left greater than right. No pneumothorax is present. HEART AND MEDIASTINUM: No acute abnormality of the cardiac and mediastinal silhouettes. BONES AND SOFT TISSUES: No acute osseous abnormality. IMPRESSION: 1. Persistent interstitial and airspace opacities bilaterally, left greater than right, without significant interval change. 2. Stable bilateral effusions, left greater than right. Electronically signed by: Audree Leas MD 07/19/2023 07:17 PM EDT RP Workstation: [id number]    Procedures Procedures    Medications Ordered in ED Medications  sodium chloride  0.9 % bolus 500 mL (has no administration in time range)  ipratropium-albuterol  (DUONEB) 0.5-2.5 (3) MG/3ML nebulizer solution 3 mL (has no administration in time range)    ED Course/ Medical Decision Making/ A&P                                 Medical Decision Making Amount and/or Complexity of Data Reviewed Labs: ordered. Radiology: ordered.  Risk Prescription drug management.   Patient is a 73 year old who presents with shortness of breath and tachycardia.  He has been seen multiple times in the last few months for similar symptoms.  He has been admitted several times for this as well.  He does have a history of lung cancer but it is  not seeming like he is on any treatment for this.  Reportedly has had a prior history of PE but is not currently on anticoagulants.  However he has had a CT angio of his chest once a month for the last 2 months that have been negative for PE.  He has had similar symptoms of hypoxia and tachycardia on those visits as well.  He was given nebulizer treatment in the ED.  We have been able to switch him back to a nasal cannula and he seems to be tolerating this well.  His heart  rate has improved.  He was given some IV fluids.  His D-dimer is elevated and awaiting CTA of his chest.  Will also check procalcitonin but will not start antibiotics at this point as he seems to be having a presentation similar to his prior episodes.  Care turned over to Dr Carylon Claude pending labs/CT  Notes from last hospital discharge summary 3/27  *" Acute on chronic respiratory failure with hypoxia (HCC) - chronically on 5 L/min home O2. 05-17-2023 through 05-22-2024 antibiotics stopped as patient has received multiple courses of antibiotics in the last 3 months.  Respiratory virus panel is negative.  Procalcitonin has been less than 0.10.  Seen by palliative.  Though [facility] he refuses hospice.  He does not want to go home as he feels he cannot take care of himself.  His oxygen  drops with any activities.  His roommate is not able to care for him.  He has had 5 admissions this year in the last 2-1/2 months.  He was suicidal a month ago.  He wants someone to take care of him.  At the same time he does not want to sign up for hospice.  PCCM saw him last admission and [facility] as there was nothing they could offer him here.  He has  agreed to DNR/DNI. Palliative have started him on morphine  for dyspnea he reports the morphine  makes him very sick.  Will stop the morphine .   Appreciate oncology input patient to follow-up with Dr. Marguerita Shih as an outpatient to start oral chemotherapy.   05-24-2023 continue with 5 L/min home O2. CM starting to look for SNF.   05-25-2023 yesterday, after agreeing to go to SNF if accepted, pt tells CM that he won't go to SNF because he can do physical therapy at home. SNF bed search canceled. I would not be surprised that when RN gets him ready for DC today, he tries to change his mind again and wants CM to look for SNF. Pt is stable for DC today.   Pt with chronic malingering behavior. Pt is chronically on 3-5 L/min home  O2. He has chronic hypoxia. Do NOT admit patient for hypoxia. Keeps O2 sats 88-92%. Pt chronically complains of constipation but then refuses constipation treatment. Pt's O2 sats always drop with activity. This is NOT new. Do NOT admit the patient because his O2 sats decrease with activity. "    Final Clinical Impression(s) / ED Diagnoses Final diagnoses:  None    Rx / DC Orders ED Discharge Orders     None         Hershel Los, MD 07/19/23 2351

## 2023-07-20 ENCOUNTER — Emergency Department (HOSPITAL_COMMUNITY)

## 2023-07-20 ENCOUNTER — Encounter (HOSPITAL_COMMUNITY): Payer: Self-pay

## 2023-07-20 LAB — PROCALCITONIN: Procalcitonin: <0.1 ng/mL

## 2023-07-20 MED ORDER — IOHEXOL 350 MG/ML SOLN
100.0000 mL | Freq: Once | INTRAVENOUS | Status: AC | PRN
  Administered 2023-07-20: 100 mL via INTRAVENOUS

## 2023-07-20 NOTE — ED Provider Notes (Signed)
 Patient signed out pending CT imaging and procalcitonin level.  In brief, patient with multiple hospitalizations and readmissions with chronic respiratory failure on chronic home oxygen .  Recently increased to 8 L.  CT imaging was read as multifocal pneumonia.  However when compared to prior CT imaging in the last 2 months, description of bilateral airspace opacities in the lingula lung changes are similar.  No PE.  Patient's procalcitonin level is less than 10 which would argue against acute infection.  He is on 8 L satting 92% which based on prior data and evaluations is consistent with his baseline.  Will plan for discharge back home.  Will not start antibiotics at this time.  On recheck, patient appears stable at baseline.  Physical Exam  BP (!) 167/97   Pulse 100   Temp 98 F (36.7 C) (Oral)   Resp 17   SpO2 92%    Procedures  Procedures  ED Course / MDM    Medical Decision Making Amount and/or Complexity of Data Reviewed Labs: ordered. Radiology: ordered.  Risk Prescription drug management.   Problem List Items Addressed This Visit   None Visit Diagnoses       Chronic hypoxic respiratory failure, on home oxygen  therapy [facility])    -  Primary             Carylon Claude, Vonzella Guernsey, MD 07/20/23 [phone]

## 2023-07-20 NOTE — ED Notes (Signed)
 Discharge instructions provided to patient, patient calling roommate for ride home.

## 2023-07-20 NOTE — ED Notes (Signed)
 Patient transported to CT

## 2023-07-20 NOTE — ED Notes (Signed)
 Patient just came back from CT, Will try to decrease O2 to 6.5L per provider request.

## 2023-07-20 NOTE — ED Notes (Signed)
 Weaned back down to 8L on HFNC

## 2023-07-20 NOTE — Discharge Instructions (Signed)
 You were seen today for concerns for increasing oxygen  requirement.  You have chronic respiratory failure.  Continue your home oxygen  and medications as previously prescribed.  At this time, your lab testing does not indicate recurrent pneumonia.

## 2023-07-20 NOTE — ED Notes (Addendum)
 Titrated patients O2 to 6.5L [place]

## 2023-07-21 ENCOUNTER — Emergency Department (HOSPITAL_COMMUNITY)
Admission: EM | Admit: 2023-07-21 | Discharge: 2023-07-22 | Disposition: A | Discharge status: Home / Self Care | Attending: Emergency Medicine | Admitting: Emergency Medicine

## 2023-07-21 ENCOUNTER — Other Ambulatory Visit: Payer: Self-pay

## 2023-07-21 DIAGNOSIS — R0602 Shortness of breath: Secondary | ICD-10-CM | POA: Diagnosis present

## 2023-07-21 DIAGNOSIS — J449 Chronic obstructive pulmonary disease, unspecified: Secondary | ICD-10-CM | POA: Diagnosis not present

## 2023-07-21 DIAGNOSIS — Z85118 Personal history of other malignant neoplasm of bronchus and lung: Secondary | ICD-10-CM | POA: Insufficient documentation

## 2023-07-21 DIAGNOSIS — Z7951 Long term (current) use of inhaled steroids: Secondary | ICD-10-CM | POA: Diagnosis not present

## 2023-07-21 DIAGNOSIS — Z7982 Long term (current) use of aspirin: Secondary | ICD-10-CM | POA: Diagnosis not present

## 2023-07-21 DIAGNOSIS — I10 Essential (primary) hypertension: Secondary | ICD-10-CM | POA: Diagnosis not present

## 2023-07-21 DIAGNOSIS — Z79899 Other long term (current) drug therapy: Secondary | ICD-10-CM | POA: Diagnosis not present

## 2023-07-21 MED ORDER — IPRATROPIUM-ALBUTEROL 0.5-2.5 (3) MG/3ML IN SOLN
3.0000 mL | Freq: Once | RESPIRATORY_TRACT | Status: AC
  Administered 2023-07-21: 3 mL via RESPIRATORY_TRACT
  Filled 2023-07-21: qty 3

## 2023-07-21 NOTE — ED Triage Notes (Signed)
 Pt BIB [facility] from home. C/o [facility] for past 4 days. Upon ems arrival pt lung sounds clear. [facility] seen [facility] for same thing. Upon exertion pt sats drop from 96% to high 80s.   102HR 96% 130/82

## 2023-07-21 NOTE — ED Provider Notes (Signed)
 [place] EMERGENCY DEPARTMENT AT [facility] Provider Note   CSN: [id number] Arrival date & time: 07/21/23  2158     History  Chief Complaint  Patient presents with   Shortness of Breath    Mario [name] is a 73 y.o. male.  73 year old male with history of COPD, HTN, depression, MI, OSA, lung CA, PE, presents with complaint of SHOB. Patient states he took a 10mg  hydrocodone  for neck pain and then took a shower. He then felt very SHOB and his HR was elevated more so than usual. Patient states he thinks this happened because of the hydrocodone , states he rarely takes this. Currently on NRB at 10L.        Home Medications Prior to Admission medications   Medication Sig Start Date End Date Taking? Authorizing Provider  acetaminophen  (TYLENOL ) 500 MG tablet Take 1,000 mg by mouth every 6 (six) hours as needed for mild pain (pain score 1-3), moderate pain (pain score 4-6) or headache.    [provider]  albuterol  (PROVENTIL ) (2.5 MG/3ML) 0.083% nebulizer solution Take 2.5 mg by nebulization every 6 (six) hours as needed for wheezing or shortness of breath.    [provider]  albuterol  (VENTOLIN  HFA) 108 (90 Base) MCG/ACT inhaler Inhale 2 puffs into the lungs every 4 (four) hours as needed for wheezing or shortness of breath. 11/04/21   Audria Leather, MD  aspirin  EC 81 MG tablet Take 81 mg by mouth daily. Swallow whole.    [provider]  azelastine  (ASTELIN ) 0.1 % nasal spray Place 1 spray into both nostrils 2 (two) times daily as needed for allergies or rhinitis (or post-nasal drip).    [provider]  bisacodyl  (DULCOLAX) 5 MG EC tablet Take 2 tablets (10 mg total) by mouth daily as needed for moderate constipation. 01/26/23   Wynetta Heckle, MD  BLACK CURRANT SEED OIL PO Take 2 capsules by mouth in the morning.    [provider]  buPROPion  (WELLBUTRIN  XL) 300 MG 24 hr tablet Take 300 mg by mouth every morning. 02/05/20    [provider]  busPIRone  (BUSPAR ) 15 MG tablet Take 15 mg by mouth in the morning.    [provider]  Calcium  Carbonate (CALCIUM  500 PO) Take 500 mg by mouth in the morning.    [provider]  Carboxymethylcellulose Sod PF 0.5 % SOLN Place 1 drop into both eyes 4 (four) times daily as needed (dry eyes).    [provider]  cholecalciferol  (VITAMIN D3) 25 MCG (1000 UNIT) tablet Take 1,000 Units by mouth daily.    [provider]  Cyanocobalamin (VITAMIN B12 PO) Take 1 tablet by mouth in the morning.    [provider]  cyclobenzaprine  (FLEXERIL ) 10 MG tablet Take 20 mg by mouth at bedtime.    [provider]  cycloSPORINE  (RESTASIS ) 0.05 % ophthalmic emulsion Place 1 drop into both eyes every 12 (twelve) hours.    [provider]  diltiazem  (CARDIZEM  CD) 120 MG 24 hr capsule Take 1 capsule (120 mg total) by mouth daily. 05/25/23 08/23/23  Unk Garb, DO  docusate sodium  (COLACE) 100 MG capsule Take 200 mg by mouth 2 (two) times daily.    [provider]  DULoxetine  (CYMBALTA ) 60 MG capsule Take 60 mg by mouth 2 (two) times daily.    [provider]  ferrous sulfate 325 (65 FE) MG tablet Take 325 mg by mouth daily with breakfast.  [provider]  fluticasone  (FLONASE ) 50 MCG/ACT nasal spray Place 2 sprays into both nostrils in the morning.    [provider]  folic acid  (FOLVITE ) 1 MG tablet Take 1 mg by mouth daily.    [provider]  gabapentin  (NEURONTIN ) 100 MG capsule Take 100 mg by mouth 3 (three) times daily.    [provider]  GARLIC OIL PO Take 1 capsule by mouth in the morning.    [provider]  Glucerna (GLUCERNA) LIQD Take 237 mLs by mouth See admin instructions. Glucerna Therapeutic Shake chocolate- Drink 1 bottle by mouth once a day    [provider]  HYDROcodone -acetaminophen  (NORCO) 10-325 MG tablet Take 1 tablet by mouth every 6  (six) hours as needed for moderate pain (pain score 4-6).    [provider]  KRILL OIL PO Take 2 capsules by mouth daily.    [provider]  lactulose  (CHRONULAC ) 10 GM/15ML solution Take 20 g by mouth 2 (two) times daily as needed for mild constipation.    [provider]  lidocaine  (XYLOCAINE ) 5 % ointment Apply 1 Application topically 2 (two) times daily as needed (for pain- affected sites).    [provider]  magnesium  citrate SOLN Take 0.5 Bottles by mouth daily as needed for severe constipation.    [provider]  Magnesium  Oxide (MAG-OXIDE PO) Take 1 tablet by mouth daily.    [provider]  Melatonin 3 MG CAPS Take 6 mg by mouth at bedtime.    [provider]  metFORMIN  (GLUCOPHAGE ) 850 MG tablet Take 850 mg by mouth daily after breakfast.    [provider]  Misc Natural Products (TURMERIC CURCUMIN) CAPS Take 3 capsules by mouth See admin instructions. Turmeric/Bioperine/Ginger/Garlic- Take 3 capsules by mouth once a day    [provider]  Mometasone  Furoate (ASMANEX  HFA) 200 MCG/ACT AERO Inhale 2 puffs into the lungs daily as needed (for severe COPD symptoms (when tolerated) and RINSE MOUTH VERY WELL WITH WATER AFTERWARDS).    [provider]  Multiple Vitamin (MULTIVITAMIN) tablet Take 1 tablet by mouth daily with breakfast.    [provider]  NON FORMULARY Place 1 application  into both nostrils See admin instructions. InstaCure's Original Nose Balm with Manuka Honey- Place into the affected nasal passage twice a day as needed for irritation    [provider]  NON FORMULARY Take 2-4 tablets by mouth See admin instructions. ZzzQuil PURE Zzzs Melatonin Gummies- Chew 2 to 4 gummies by mouth at bedtime    [provider]  omeprazole  (PRILOSEC) 20 MG capsule Take 1 capsule (20 mg total) by mouth daily. Patient taking differently: Take 20 mg by mouth daily as needed (for  reflux symptoms). 01/26/23   Wynetta Heckle, MD  ondansetron  (ZOFRAN -ODT) 4 MG disintegrating tablet Take 1 tablet (4 mg total) by mouth every 8 (eight) hours as needed for nausea or vomiting. Patient taking differently: Take 4 mg by mouth every 8 (eight) hours as needed for nausea or vomiting (dissolve orally). 05/23/23   Barbee Lew, MD  Oxcarbazepine  (TRILEPTAL ) 300 MG tablet Take 600 mg by mouth in the morning, at noon, and at bedtime.    [provider]  OXYGEN  Inhale 6 L/min into the lungs continuous.    [provider]  polyethylene glycol powder (GLYCOLAX /MIRALAX ) 17 GM/SCOOP powder Take 17 g by mouth in the morning.    [provider]  psyllium (HYDROCIL/METAMUCIL) 95 % PACK Take  1 packet by mouth daily. Patient not taking: Reported on 06/10/2023 04/02/23   Gherghe, Costin M, MD  senna (SENOKOT) 8.6 MG TABS tablet Take 2 tablets (17.2 mg total) by mouth 2 (two) times daily. Patient taking differently: Take 1 tablet by mouth daily. 05/23/23   Barbee Lew, MD  sodium chloride  (OCEAN) 0.65 % SOLN nasal spray Place 1 spray into both nostrils daily at 2 am. Patient taking differently: Place 1 spray into both nostrils as needed for congestion. 04/24/23   Barbee Lew, MD  tamsulosin  (FLOMAX ) 0.4 MG CAPS capsule Take 0.8 mg by mouth in the morning.    [provider]  Tiotropium Bromide -Olodaterol 2.5-2.5 MCG/ACT AERS Inhale 2 puffs into the lungs every morning. 2 puffs    [provider]  traZODone  (DESYREL ) 50 MG tablet Take 75 mg by mouth at bedtime.    [provider]      Allergies    Demerol [meperidine], Zocor [simvastatin], Beet [beta vulgaris], and Liver    Review of Systems   Review of Systems Negative except as per HPI Physical Exam Updated Vital Signs BP (!) 168/108 (BP Location: Right Arm)   Pulse (!) 106   Temp 98.9 F (37.2 C) (Oral)   Resp (!) 22   SpO2 95%  Physical Exam Vitals and nursing  note reviewed.  Constitutional:      General: He is not in acute distress.    Appearance: He is well-developed. He is not diaphoretic.  HENT:     Head: Normocephalic and atraumatic.  Cardiovascular:     Rate and Rhythm: Regular rhythm. Tachycardia present.  Pulmonary:     Effort: Pulmonary effort is normal.     Comments: Course lung sounds throughout  Musculoskeletal:     Right lower leg: No edema.     Left lower leg: No edema.  Skin:    General: Skin is warm and dry.  Neurological:     Mental Status: He is alert and oriented to person, place, and time.  Psychiatric:        Behavior: Behavior normal.     ED Results / Procedures / Treatments   Labs (all labs ordered are listed, but only abnormal results are displayed) Labs Reviewed - No data to display  EKG None  Radiology No results found.   Procedures Procedures    Medications Ordered in ED Medications  ipratropium-albuterol  (DUONEB) 0.5-2.5 (3) MG/3ML nebulizer solution 3 mL (3 mLs Nebulization Given 07/21/23 2300)  ipratropium-albuterol  (DUONEB) 0.5-2.5 (3) MG/3ML nebulizer solution 3 mL (3 mLs Nebulization Given 07/22/23 0127)    ED Course/ Medical Decision Making/ A&P                                 Medical Decision Making Risk Prescription drug management.   This patient presents to the ED for concern of shortness of breath, this involves an extensive number of treatment options, and is a complaint that carries with it a high risk of complications and morbidity.  The differential diagnosis includes hypoxia, PE, PNA, COPD, pleural effusion    Co morbidities that complicate the patient evaluation  Lung ca, COPD on 7.5L [place] at baseline, HTN, anxiety, depression, OSA, PE   Additional history obtained:  Additional history obtained from EMS who contributes to history in triage note External records from outside source obtained and reviewed including ER visit dated 07/20/23 including CT report,  labs  Problem List / ED Course / Critical interventions / Medication management  73 year old male presents with complaint of shortness of breath, acute on chronic.  Patient states that he took a hydrocodone  for some pain in his neck and then went and got in the shower tonight.  He then felt worsening of his baseline shortness of breath with elevated heart rate.  This seems to be recurrent problem for him.  He has known COPD and lung cancer, not currently undergoing any treatment for his lung cancer.  He does have a nebulizer however has not used this since Monday.  He states that when he feels short of breath he just increases the oxygen  on his concentrator and questions if he needs to have his oxygen  increased.  He has coarse lung sounds throughout.  He was just recently evaluated in this emergency room 2 days prior to include CTA of the chest which was negative for PE and a negative procalcitonin.  He is afebrile.  His symptoms have improved with oxygen  in the department.  He is agreeable with plan for nebulizer treatment and likely discharge back home.  He is encouraged to follow-up with his care team.  Discussed the role of palliative care.  He states that he is not ready for hospice.  Explained palliative care is not hospice but could work to increase his level of comfort acknowledging his air hunger due to his chronic underlying problems.  He is encouraged to use his nebulizer machine more regularly, especially before he does any sort of exertional activity. I ordered medication including duoneb  for [facility]  Reevaluation of the patient after these medicines showed that the patient improved I have reviewed the patients home medicines and have made adjustments as needed   Social Determinants of Health:  Care through the [place] system    Test / Admission - Considered:  Stable for dc         Final Clinical Impression(s) / ED Diagnoses Final diagnoses:  SOB (shortness of breath)    Rx /  DC Orders ED Discharge Orders     None         Darlis Eisenmenger, PA-C 07/22/23 0201    Earma Gloss, MD 07/22/23 [phone]

## 2023-07-21 NOTE — Discharge Instructions (Signed)
 Recommend discussing palliative care referral with your primary care provider.

## 2023-07-22 MED ORDER — IPRATROPIUM-ALBUTEROL 0.5-2.5 (3) MG/3ML IN SOLN
3.0000 mL | Freq: Once | RESPIRATORY_TRACT | Status: AC
  Administered 2023-07-22: 3 mL via RESPIRATORY_TRACT
  Filled 2023-07-22: qty 3

## 2023-07-22 NOTE — ED Notes (Signed)
 Pt discharge with house mate

## 2023-07-30 IMAGING — CT CT ANGIO CHEST
3 of 7 series · 17 of 36 positions shown · IV contrast (OMNIPAQUE 350)
Comparison: PA Lat chest today, chest x-ray 05/21/2021, CTA chest
05/19/2021 and CTA chest 02/03/2021

CLINICAL DATA: Chest pain, shortness of breath, leukocytosis and
history of COPD.

EXAM:
CT ANGIOGRAPHY CHEST WITH CONTRAST
TECHNIQUE: Multidetector CT imaging of the chest was performed using the
standard protocol during bolus administration of intravenous
contrast. Multiplanar CT image reconstructions and MIPs were
obtained to evaluate the vascular anatomy.

[Series 5: thins · axial · 0.86mm/px · z∈[+1526,+1804]mm · 12 of 330 slices shown]
[im 26/330  lung]
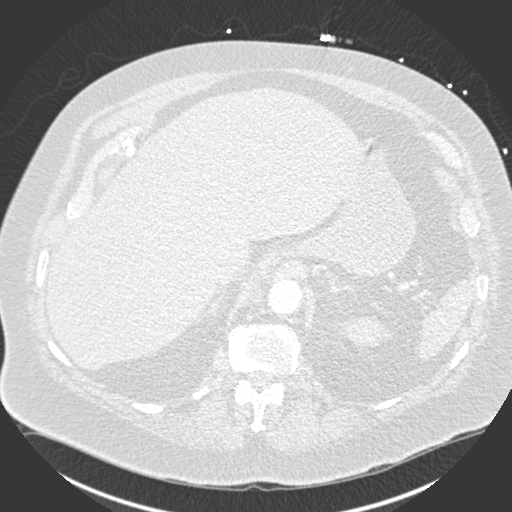
[im 51/330  mediastinal]
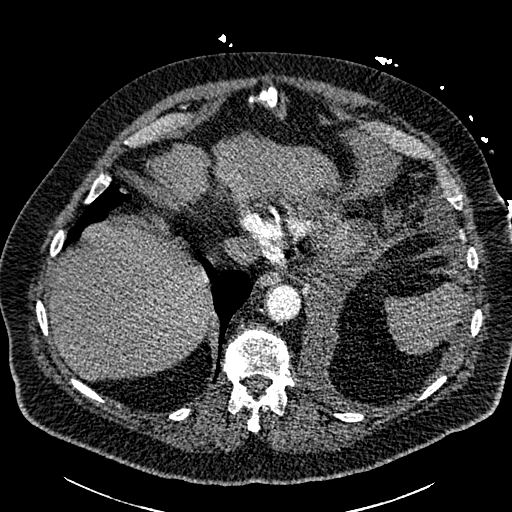
[im 76/330  lung]
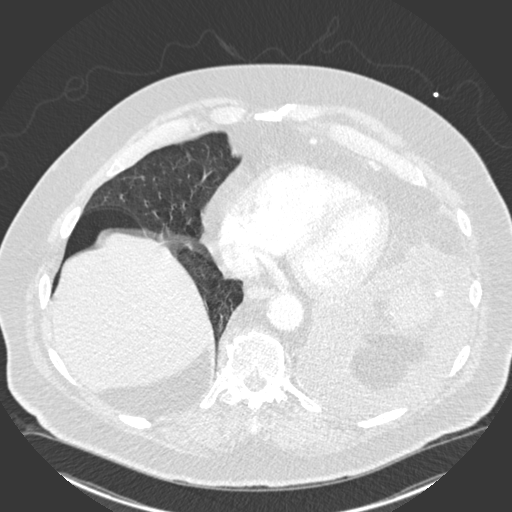
[im 102/330  mediastinal]
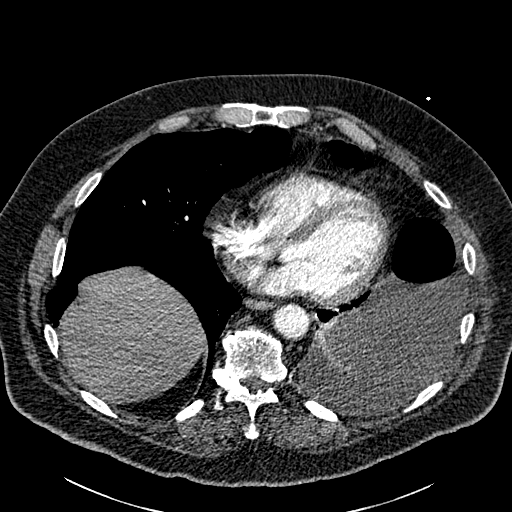
[im 127/330  lung]
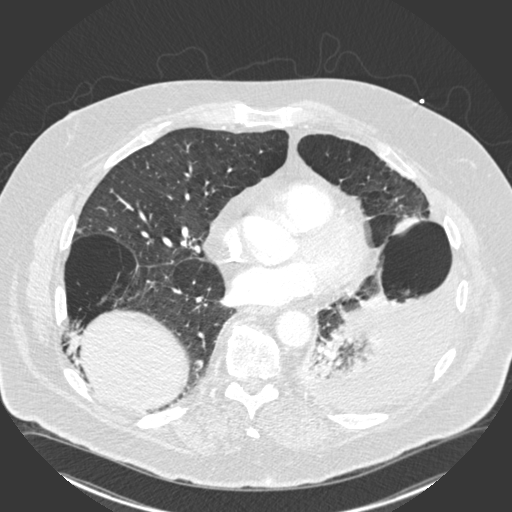
[im 152/330  mediastinal]
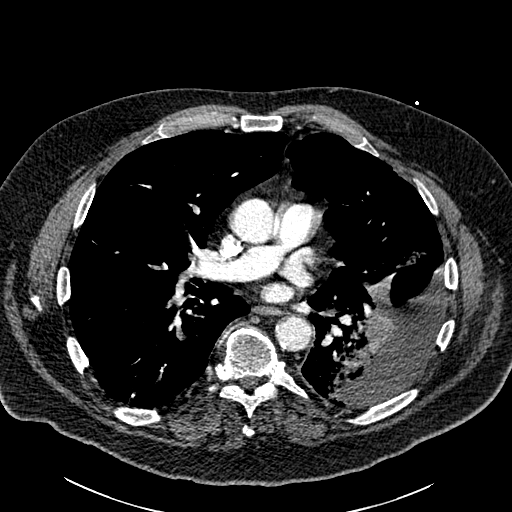
[im 178/330  lung]
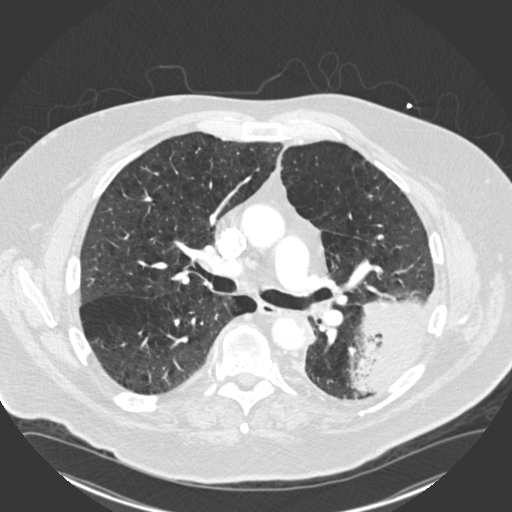
[im 203/330  mediastinal]
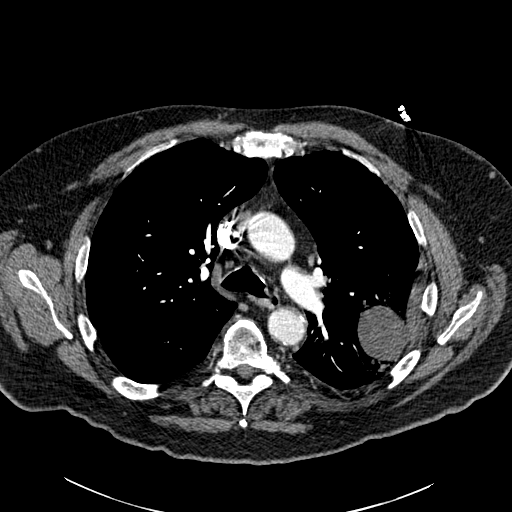
[im 228/330  lung]
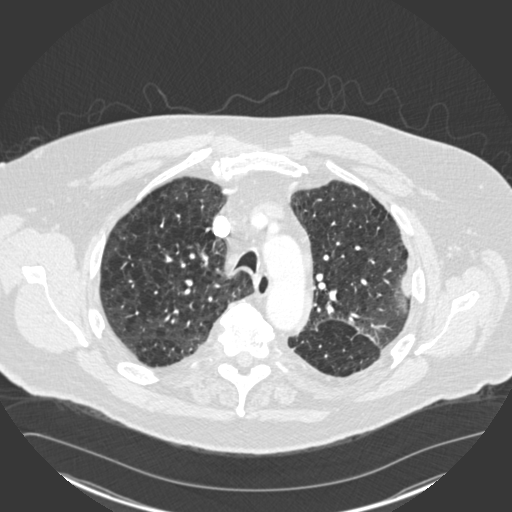
[im 254/330  mediastinal]
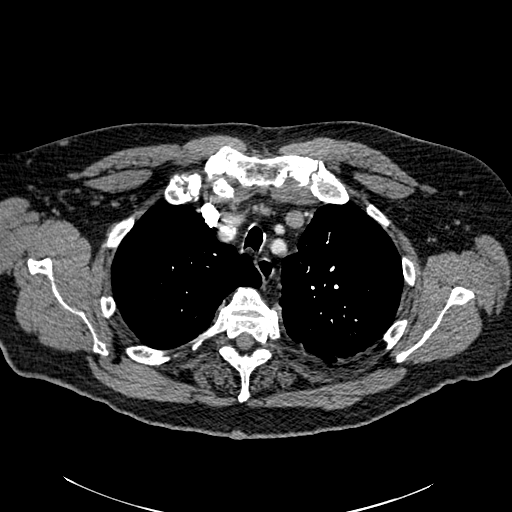
[im 279/330  lung]
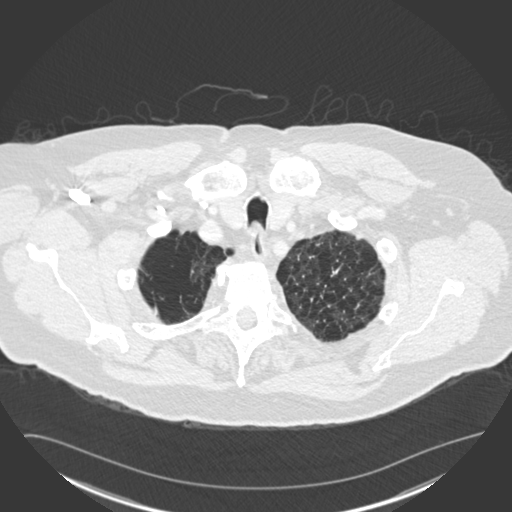
[im 304/330  mediastinal]
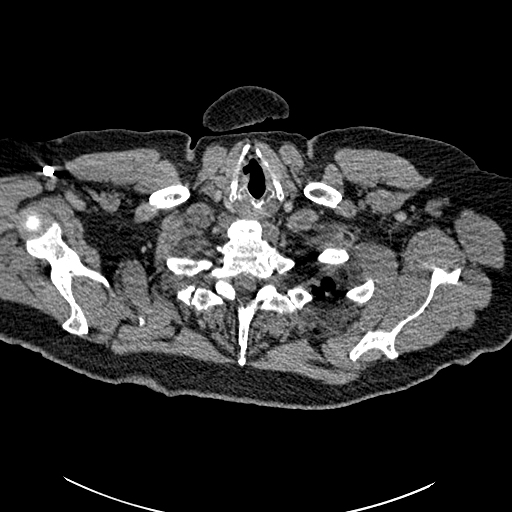

[Series 6: lung · axial · 0.86mm/px · z∈[+1580,+1730]mm · 4 of 151 slices shown]
[im 26/151  mediastinal]
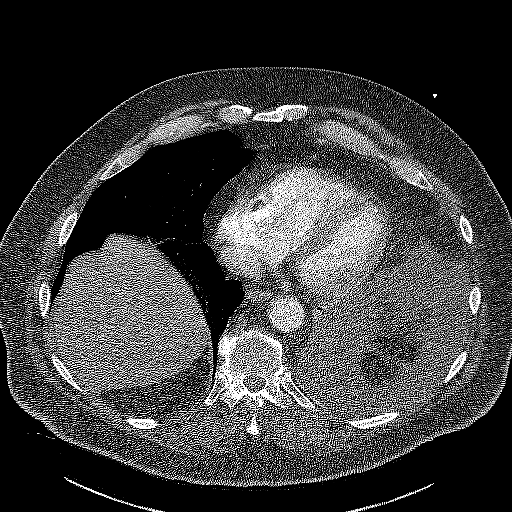
[im 51/151  mediastinal]
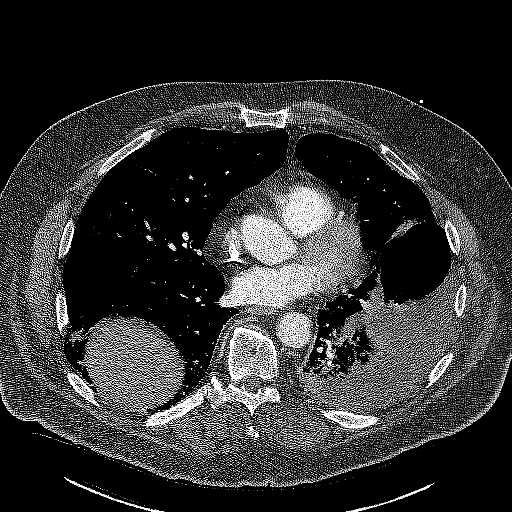
[im 76/151  mediastinal]
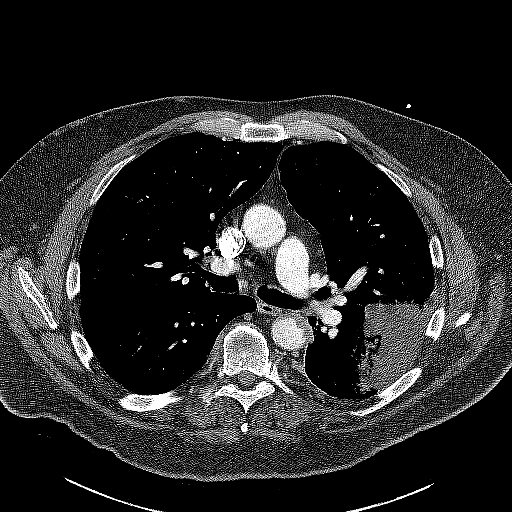
[im 101/151  mediastinal]
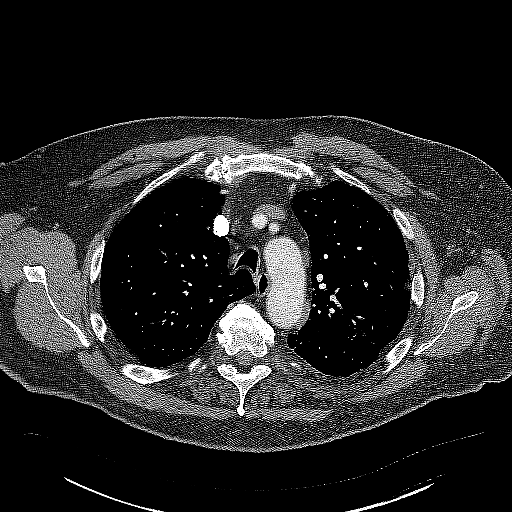

[Series 7: coronal mpr · coronal · 0.66mm/px · 1 of 176 slices shown]
[im 88/176  mediastinal]
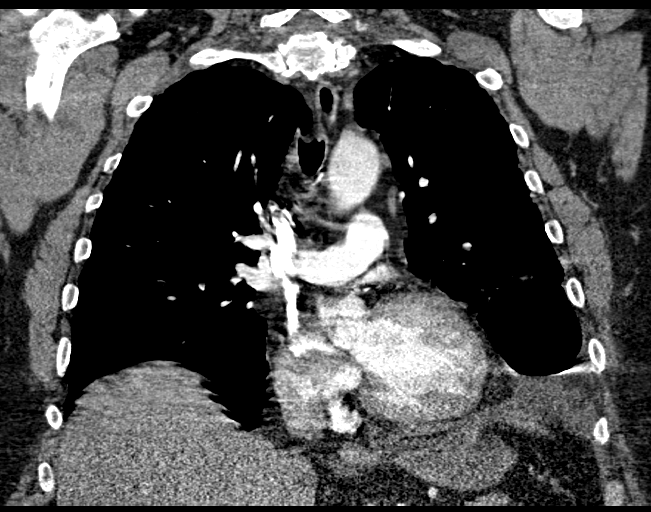

[17 of 36 positions shown; findings below may reference images not displayed]

RADIATION DOSE REDUCTION: This exam was performed according to the
departmental dose-optimization program which includes automated
exposure control, adjustment of the mA and/or kV according to
patient size and/or use of iterative reconstruction technique.

CONTRAST:  100mL OMNIPAQUE IOHEXOL 350 MG/ML SOLN
FINDINGS: Cardiovascular: Left and right main pulmonary arteries are slightly
prominent indicating arterial hypertension, but unchanged. No
arterial embolic filling defect is seen. The cardiac size is normal.

There is no pericardial effusion. There is three-vessel patchy
calcific CAD, and aortic atherosclerosis with mild-to-moderate mixed
plaque in the distal arch and descending segment.

Some of the soft plaque is mildly ulcerative in the mid to distal
descending segment but no penetrating ulcer or dissection or
aneurysm is seen. There is scattered plaque in the great vessels
without stenosis.

Mediastinum/Nodes: No thyroid mass is seen. There is a mucoid
septation in the trachea with no significant filling defect. There
is no intrathoracic or axillary adenopathy or esophageal thickening.

Lungs/Pleura: Severe emphysematous disease again noted with basilar
bullae. Moderate left pleural fluid is increased in the interval and
it appears to be at least partially loculated in the subpulmonic
area posteriorly with a portion extending into the oblique fissure
as before. There is no right pleural effusion. There is eventration
and chronic elevation of the posterior right diaphragm.

Airspace consolidation continues to be seen in the adjacent left
lower lobe relatively sparing the medial basal and superior
segments. Again this obscures the known left lower lobe mass seen on
02/03/2021. There is persistence of a 1.5 cm lingular base fissural
nodule versus small fissural fluid. No pneumothorax is seen.

Rest of the lungs are generally clear. No central airway fluid or
debris is seen.

Upper Abdomen: Mildly prominent liver with mild steatosis.

Musculoskeletal: There is bridging enthesopathy throughout the
thoracic spine and bridging syndesmophytes. Does the patient have
history of ankylosing spondylitis? There is multilevel anterior
ankylosis across the mid to lower thoracic vertebral bodies.

Review of the MIP images confirms the above findings.
IMPRESSION: 1. Slightly prominent main pulmonary arteries. No arterial embolic
filling defect.
2. Left lower lobe consolidation continues to be seen , not
significantly changed from 16 days ago but with increased underlying
left pleural effusion which is predominantly subpulmonic and may at
least be partially loculated, with partial fissural extension of the
fluid.
3. There is no enhancing pleural rind around the fluid to suspect
empyema, and the fluid is low in density more consistent with
transudative than exudative effusion.
4. Persistence of 1.5 cm fissural nodule or fluid in the lingular
base.
5. Advanced emphysematous disease.
6. Aortic and coronary artery atherosclerosis.

## 2023-11-23 ENCOUNTER — Emergency Department (HOSPITAL_COMMUNITY)

## 2023-11-23 ENCOUNTER — Inpatient Hospital Stay (HOSPITAL_COMMUNITY)
Admission: EM | Admit: 2023-11-23 | Discharge: 2023-11-28 | DRG: 190 | Disposition: A | Discharge status: Home Health | Attending: Internal Medicine | Admitting: Internal Medicine

## 2023-11-23 ENCOUNTER — Encounter (HOSPITAL_COMMUNITY): Payer: Self-pay | Admitting: Emergency Medicine

## 2023-11-23 ENCOUNTER — Other Ambulatory Visit: Payer: Self-pay

## 2023-11-23 DIAGNOSIS — Z79899 Other long term (current) drug therapy: Secondary | ICD-10-CM

## 2023-11-23 DIAGNOSIS — R000 Tachycardia, unspecified: Secondary | ICD-10-CM | POA: Diagnosis not present

## 2023-11-23 DIAGNOSIS — F419 Anxiety disorder, unspecified: Secondary | ICD-10-CM | POA: Diagnosis present

## 2023-11-23 DIAGNOSIS — E1140 Type 2 diabetes mellitus with diabetic neuropathy, unspecified: Secondary | ICD-10-CM | POA: Diagnosis present

## 2023-11-23 DIAGNOSIS — J441 Chronic obstructive pulmonary disease with (acute) exacerbation: Principal | ICD-10-CM | POA: Diagnosis present

## 2023-11-23 DIAGNOSIS — M456 Ankylosing spondylitis lumbar region: Secondary | ICD-10-CM | POA: Diagnosis present

## 2023-11-23 DIAGNOSIS — I252 Old myocardial infarction: Secondary | ICD-10-CM

## 2023-11-23 DIAGNOSIS — Z85118 Personal history of other malignant neoplasm of bronchus and lung: Secondary | ICD-10-CM

## 2023-11-23 DIAGNOSIS — Z85828 Personal history of other malignant neoplasm of skin: Secondary | ICD-10-CM

## 2023-11-23 DIAGNOSIS — I152 Hypertension secondary to endocrine disorders: Secondary | ICD-10-CM | POA: Diagnosis present

## 2023-11-23 DIAGNOSIS — Z66 Do not resuscitate: Secondary | ICD-10-CM | POA: Diagnosis present

## 2023-11-23 DIAGNOSIS — Z888 Allergy status to other drugs, medicaments and biological substances status: Secondary | ICD-10-CM

## 2023-11-23 DIAGNOSIS — G4733 Obstructive sleep apnea (adult) (pediatric): Secondary | ICD-10-CM | POA: Diagnosis present

## 2023-11-23 DIAGNOSIS — I872 Venous insufficiency (chronic) (peripheral): Secondary | ICD-10-CM | POA: Diagnosis present

## 2023-11-23 DIAGNOSIS — Z91199 Patient's noncompliance with other medical treatment and regimen due to unspecified reason: Secondary | ICD-10-CM

## 2023-11-23 DIAGNOSIS — Z713 Dietary counseling and surveillance: Secondary | ICD-10-CM

## 2023-11-23 DIAGNOSIS — Z1152 Encounter for screening for COVID-19: Secondary | ICD-10-CM

## 2023-11-23 DIAGNOSIS — J309 Allergic rhinitis, unspecified: Secondary | ICD-10-CM | POA: Diagnosis present

## 2023-11-23 DIAGNOSIS — E6609 Other obesity due to excess calories: Secondary | ICD-10-CM

## 2023-11-23 DIAGNOSIS — Z9151 Personal history of suicidal behavior: Secondary | ICD-10-CM

## 2023-11-23 DIAGNOSIS — E1122 Type 2 diabetes mellitus with diabetic chronic kidney disease: Secondary | ICD-10-CM | POA: Diagnosis present

## 2023-11-23 DIAGNOSIS — E66812 Obesity, class 2: Secondary | ICD-10-CM | POA: Diagnosis present

## 2023-11-23 DIAGNOSIS — J9620 Acute and chronic respiratory failure, unspecified whether with hypoxia or hypercapnia: Secondary | ICD-10-CM | POA: Diagnosis present

## 2023-11-23 DIAGNOSIS — Z7984 Long term (current) use of oral hypoglycemic drugs: Secondary | ICD-10-CM

## 2023-11-23 DIAGNOSIS — Z7722 Contact with and (suspected) exposure to environmental tobacco smoke (acute) (chronic): Secondary | ICD-10-CM | POA: Diagnosis present

## 2023-11-23 DIAGNOSIS — I129 Hypertensive chronic kidney disease with stage 1 through stage 4 chronic kidney disease, or unspecified chronic kidney disease: Secondary | ICD-10-CM | POA: Diagnosis present

## 2023-11-23 DIAGNOSIS — Z87891 Personal history of nicotine dependence: Secondary | ICD-10-CM

## 2023-11-23 DIAGNOSIS — F339 Major depressive disorder, recurrent, unspecified: Secondary | ICD-10-CM | POA: Diagnosis present

## 2023-11-23 DIAGNOSIS — Z6836 Body mass index (BMI) 36.0-36.9, adult: Secondary | ICD-10-CM

## 2023-11-23 DIAGNOSIS — Z91018 Allergy to other foods: Secondary | ICD-10-CM

## 2023-11-23 DIAGNOSIS — C3492 Malignant neoplasm of unspecified part of left bronchus or lung: Secondary | ICD-10-CM | POA: Diagnosis present

## 2023-11-23 DIAGNOSIS — E1159 Type 2 diabetes mellitus with other circulatory complications: Secondary | ICD-10-CM | POA: Diagnosis present

## 2023-11-23 DIAGNOSIS — Z1589 Genetic susceptibility to other disease: Secondary | ICD-10-CM

## 2023-11-23 DIAGNOSIS — G8929 Other chronic pain: Secondary | ICD-10-CM | POA: Diagnosis present

## 2023-11-23 DIAGNOSIS — E1165 Type 2 diabetes mellitus with hyperglycemia: Secondary | ICD-10-CM | POA: Diagnosis present

## 2023-11-23 DIAGNOSIS — E119 Type 2 diabetes mellitus without complications: Secondary | ICD-10-CM

## 2023-11-23 DIAGNOSIS — N400 Enlarged prostate without lower urinary tract symptoms: Secondary | ICD-10-CM | POA: Diagnosis present

## 2023-11-23 DIAGNOSIS — Z86711 Personal history of pulmonary embolism: Secondary | ICD-10-CM

## 2023-11-23 DIAGNOSIS — N1831 Chronic kidney disease, stage 3a: Secondary | ICD-10-CM | POA: Diagnosis present

## 2023-11-23 DIAGNOSIS — Z23 Encounter for immunization: Secondary | ICD-10-CM

## 2023-11-23 DIAGNOSIS — Z7982 Long term (current) use of aspirin: Secondary | ICD-10-CM

## 2023-11-23 DIAGNOSIS — Z7951 Long term (current) use of inhaled steroids: Secondary | ICD-10-CM

## 2023-11-23 DIAGNOSIS — Z923 Personal history of irradiation: Secondary | ICD-10-CM

## 2023-11-23 DIAGNOSIS — Z9981 Dependence on supplemental oxygen: Secondary | ICD-10-CM

## 2023-11-23 DIAGNOSIS — J90 Pleural effusion, not elsewhere classified: Secondary | ICD-10-CM | POA: Diagnosis present

## 2023-11-23 DIAGNOSIS — Z885 Allergy status to narcotic agent status: Secondary | ICD-10-CM

## 2023-11-23 LAB — BLOOD GAS, VENOUS
Acid-Base Excess: 8.1 mmol/L — ABNORMAL HIGH (ref 0.0–2.0)
Bicarbonate: 33.4 mmol/L — ABNORMAL HIGH (ref 20.0–28.0)
O2 Saturation: 58.1 %
Patient temperature: 37
pCO2, Ven: 48 mmHg (ref 44–60)
pH, Ven: 7.45 — ABNORMAL HIGH (ref 7.25–7.43)
pO2, Ven: 33 mmHg (ref 32–45)

## 2023-11-23 MED ORDER — METHYLPREDNISOLONE SODIUM SUCC 125 MG IJ SOLR
125.0000 mg | Freq: Once | INTRAMUSCULAR | Status: AC
  Administered 2023-11-23: 125 mg via INTRAVENOUS
  Filled 2023-11-23: qty 2

## 2023-11-23 MED ORDER — MAGNESIUM SULFATE 2 GM/50ML IV SOLN
2.0000 g | Freq: Once | INTRAVENOUS | Status: AC
  Administered 2023-11-23: 2 g via INTRAVENOUS
  Filled 2023-11-23: qty 50

## 2023-11-23 MED ORDER — IPRATROPIUM-ALBUTEROL 0.5-2.5 (3) MG/3ML IN SOLN
3.0000 mL | Freq: Once | RESPIRATORY_TRACT | Status: AC
  Administered 2023-11-23: 3 mL via RESPIRATORY_TRACT
  Filled 2023-11-23: qty 3

## 2023-11-23 NOTE — ED Provider Triage Note (Signed)
 Emergency Medicine Provider Triage Evaluation Note  Mario [name] , a 73 y.o. male  was evaluated in triage.  Pt complains of SOB.  On 10 L [place] by EMS Got 1 duoneb Pt has lung cancer and COPD wears 8 L at home [place]  Review of Systems  Positive: SOB, coughing Negative: Fevers  Physical Exam  BP (!) 143/81   Pulse (!) 109   Temp 98.2 F (36.8 C)   Resp (!) 22   SpO2 (!) 86%  Gen:   Awake, no distress   Resp:  90% O2 on 10L [place], poor air movement bilaterally, retractions, RR 28, speaking in full sentences MSK:   Moves extremities without difficulty   Medical Decision Making  Medically screening exam initiated at 11:07 PM.  Appropriate orders placed.  Mario [name] was informed that the remainder of the evaluation will be completed by another provider, this initial triage assessment does not replace that evaluation, and the importance of remaining in the ED until their evaluation is complete.  Bipap, COPD medications ordered No indication for intubation on arrival - still has good respiratory effort and speaking in full sentences  Dr. Trine EDP to assume care of patient after MSE   Mario Donnice PARAS, MD 11/23/23 2308

## 2023-11-23 NOTE — ED Provider Notes (Incomplete)
 [place] EMERGENCY DEPARTMENT AT [facility] Provider Note  CSN: [id number] Arrival date & time: 11/23/23 2241  Chief Complaint(s) Shortness of Breath  HPI Mario [name] is a 73 y.o. male with a past medical history listed below including chronic COPD on 4 L nasal cannula at rest, 8 L nasal cannula with exertion at home.  Reports that he has been having increased shortness of breath and dyspnea on exertion over the past several days with productive cough noted at time.  He denies any fevers but states he feels warm.  No chest pain.  No nausea or vomiting.  No peripheral edema.  The history is provided by the patient.    Past Medical History Past Medical History:  Diagnosis Date  . Ankylosing spondylitis lumbar region (HCC) 09/25/2022  . Anxiety   . Bronchitis   . COPD (chronic obstructive pulmonary disease) (HCC)   . Depression   . History of falling 07/29/2021  . history of Pulmonary embolism (HCC) 07/02/2022  . History of radiation therapy    Left lung- 10/05/20-10/15/20- Dr. Lynwood Nasuti  . Hypertension   . lung ca 09/2020  . MI (myocardial infarction) (HCC)    ????  . On home oxygen  therapy    4L/min [place]  . OSA (obstructive sleep apnea)   . Suicidal ideation 06/23/2019  . Suicide attempt (HCC)   . Tension pneumothorax 06/27/2016  . Uveitis    Patient Active Problem List   Diagnosis Date Noted  . DNR (do not resuscitate)/DNI(Do not intubate) 05/24/2023  . Acute on chronic respiratory failure with hypoxia (HCC) - chronically on 5 L/min home O2. 05/18/2023  . Malignant neoplasm of lung (HCC) 05/06/2023  . Ankylosing spondylitis lumbar region (HCC) 09/25/2022  . Chronic pain 07/02/2022  . Anxiety   . Adjustment disorder with depressed mood 10/09/2021  . BPH (benign prostatic hyperplasia) 09/23/2021  . Metastatic carcinoma (HCC) 08/07/2021  . Non-small cell lung cancer, left (HCC) 08/07/2021  . Chronic respiratory failure with hypoxia, on home O2  therapy (HCC) - 5 L/min 08/07/2021  . 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 Obesity, Class III, BMI 40-49.9 (morbid obesity) 06/17/2021  . Essential hypertension   . Allergic rhinitis   . History of pulmonary embolus (PE) - Eliquis  during February 2025 admission was stopped due to ongoing hemoptysis and epistaxis. ENT consulted. nasal pack placed. Eliquis  stopped indefinitely   . Mood disorder 02/03/2021  . 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 09/21/2020  . Constipation   . 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 Stage 3a chronic kidney disease (HCC) 12/26/2019  . COPD (chronic obstructive pulmonary disease) (HCC) - Pt frequently wheezes after physical exertion. This does NOT mean he has a COPD exacerbation.   . OSA (obstructive sleep apnea) 06/23/2019  . Major depressive disorder, recurrent episode 10/12/2018  . 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 DM2 (diabetes mellitus, type 2) (HCC) 07/16/2017  . HLA B27 (HLA B27 positive) 07/16/2017  . Malingering 01/21/2013  . Tobacco abuse 01/11/2013  . Obesity, unspecified 01/11/2013  . Hypertension associated with diabetes (HCC) 01/10/2013   Home Medication(s) Prior to Admission medications   Medication Sig Start Date End Date Taking? Authorizing Provider  acetaminophen  (TYLENOL ) 500 MG tablet Take 1,000 mg by mouth every 6 (six) hours as needed for mild pain (pain score 1-3), moderate pain (pain score 4-6) or headache.    [provider]  albuterol  (PROVENTIL ) (2.5 MG/3ML) 0.083% nebulizer solution Take 2.5 mg by nebulization every 6 (six) hours as needed for wheezing or shortness of breath.    [provider]  albuterol  (VENTOLIN  HFA) 108 (90 Base) MCG/ACT inhaler Inhale 2 puffs into the lungs every 4 (four) hours as needed for wheezing or shortness of breath. 11/04/21   Cheryle Page, MD  aspirin  EC 81 MG tablet Take 81 mg  by mouth daily. Swallow whole.    [provider]  azelastine  (ASTELIN ) 0.1 % nasal spray Place 1 spray into both nostrils 2 (two) times daily as needed for allergies or rhinitis (or post-nasal drip).    [provider]  bisacodyl  (DULCOLAX) 5 MG EC tablet Take 2 tablets (10 mg total) by mouth daily as needed for moderate constipation. 01/26/23   Armenta Canning, MD  BLACK CURRANT SEED OIL PO Take 2 capsules by mouth in the morning.    [provider]  buPROPion  (WELLBUTRIN  XL) 300 MG 24 hr tablet Take 300 mg by mouth every morning. 02/05/20   [provider]  busPIRone  (BUSPAR ) 15 MG tablet Take 15 mg by mouth in the morning.    [provider]  Calcium  Carbonate (CALCIUM  500 PO) Take 500 mg by mouth in the morning.    [provider]  Carboxymethylcellulose Sod PF 0.5 % SOLN Place 1 drop into both eyes 4 (four) times daily as needed (dry eyes).    [provider]  cholecalciferol  (VITAMIN D3) 25 MCG (1000 UNIT) tablet Take 1,000 Units by mouth daily.    [provider]  Cyanocobalamin (VITAMIN B12 PO) Take 1 tablet by mouth in the morning.    [provider]  cyclobenzaprine  (FLEXERIL ) 10 MG tablet Take 20 mg by mouth at bedtime.    [provider]  cycloSPORINE  (RESTASIS ) 0.05 % ophthalmic emulsion Place 1 drop into both eyes every 12 (twelve) hours.    [provider]  diltiazem  (CARDIZEM  CD) 120 MG 24 hr capsule Take 1 capsule (120 mg total) by mouth daily. 05/25/23 08/23/23  Laurence Locus, DO  docusate sodium  (COLACE) 100 MG capsule Take 200 mg by mouth 2 (two) times daily.    [provider]  DULoxetine  (CYMBALTA ) 60 MG capsule Take 60 mg by mouth 2 (two) times daily.    [provider]  ferrous sulfate  325 (65 FE) MG tablet Take 325 mg by mouth daily with breakfast.    [provider]  fluticasone  (FLONASE ) 50 MCG/ACT nasal spray Place 2 sprays into both nostrils in the  morning.    [provider]  folic acid  (FOLVITE ) 1 MG tablet Take 1 mg by mouth daily.    [provider]  gabapentin  (NEURONTIN ) 100 MG capsule Take 100 mg by mouth 3 (three) times daily.    [provider]  GARLIC OIL PO Take 1 capsule by mouth in the morning.    [provider]  Glucerna (GLUCERNA) LIQD Take 237 mLs by mouth See admin instructions. Glucerna Therapeutic Shake chocolate- Drink 1 bottle by mouth once a day    [provider]  HYDROcodone -acetaminophen  (NORCO) 10-325 MG tablet Take 1 tablet by mouth every 6 (six) hours as needed for moderate pain (pain score 4-6).    [provider]  KRILL OIL PO Take 2 capsules by mouth daily.    [provider]  lactulose  (CHRONULAC ) 10 GM/15ML solution Take 20 g by mouth 2 (two) times daily as needed for  mild constipation.    [provider]  lidocaine  (XYLOCAINE ) 5 % ointment Apply 1 Application topically 2 (two) times daily as needed (for pain- affected sites).    [provider]  magnesium  citrate SOLN Take 0.5 Bottles by mouth daily as needed for severe constipation.    [provider]  Magnesium  Oxide (MAG-OXIDE PO) Take 1 tablet by mouth daily.    [provider]  Melatonin 3 MG CAPS Take 6 mg by mouth at bedtime.    [provider]  metFORMIN  (GLUCOPHAGE ) 850 MG tablet Take 850 mg by mouth daily after breakfast.    [provider]  Misc Natural Products (TURMERIC CURCUMIN) CAPS Take 3 capsules by mouth See admin instructions. Turmeric/Bioperine/Ginger/Garlic- Take 3 capsules by mouth once a day    [provider]  Mometasone  Furoate (ASMANEX  HFA) 200 MCG/ACT AERO Inhale 2 puffs into the lungs daily as needed (for severe COPD symptoms (when tolerated) and RINSE MOUTH VERY WELL WITH WATER AFTERWARDS).    [provider]  Multiple Vitamin (MULTIVITAMIN) tablet Take 1 tablet by mouth daily with breakfast.     [provider]  NON FORMULARY Place 1 application  into both nostrils See admin instructions. InstaCure's Original Nose Balm with Manuka Honey- Place into the affected nasal passage twice a day as needed for irritation    [provider]  NON FORMULARY Take 2-4 tablets by mouth See admin instructions. ZzzQuil PURE Zzzs Melatonin Gummies- Chew 2 to 4 gummies by mouth at bedtime    [provider]  omeprazole  (PRILOSEC) 20 MG capsule Take 1 capsule (20 mg total) by mouth daily. Patient taking differently: Take 20 mg by mouth daily as needed (for reflux symptoms). 01/26/23   Armenta Canning, MD  ondansetron  (ZOFRAN -ODT) 4 MG disintegrating tablet Take 1 tablet (4 mg total) by mouth every 8 (eight) hours as needed for nausea or vomiting. Patient taking differently: Take 4 mg by mouth every 8 (eight) hours as needed for nausea or vomiting (dissolve orally). 05/23/23   Will Almarie MATSU, MD  Oxcarbazepine  (TRILEPTAL ) 300 MG tablet Take 600 mg by mouth in the morning, at noon, and at bedtime.    [provider]  OXYGEN  Inhale 6 L/min into the lungs continuous.    [provider]  polyethylene glycol powder (GLYCOLAX /MIRALAX ) 17 GM/SCOOP powder Take 17 g by mouth in the morning.    [provider]  psyllium (HYDROCIL/METAMUCIL) 95 % PACK Take 1 packet by mouth daily. Patient not taking: Reported on 06/10/2023 04/02/23   Gherghe, Costin M, MD  senna (SENOKOT) 8.6 MG TABS tablet Take 2 tablets (17.2 mg total) by mouth 2 (two) times daily. Patient taking differently: Take 1 tablet by mouth daily. 05/23/23   Will Almarie MATSU, MD  sodium chloride  (OCEAN) 0.65 % SOLN nasal spray Place 1 spray into both nostrils daily at 2 am. Patient taking differently: Place 1 spray into both nostrils as needed for congestion. 04/24/23   Will Almarie MATSU, MD  tamsulosin  (FLOMAX ) 0.4 MG CAPS capsule Take 0.8 mg by mouth in the morning.    [provider]   Tiotropium Bromide -Olodaterol 2.5-2.5 MCG/ACT AERS Inhale 2 puffs into the lungs every morning. 2 puffs    [provider]  traZODone  (DESYREL ) 50 MG tablet Take 75 mg by mouth at bedtime.    [provider]  Allergies Demerol [meperidine], Zocor [simvastatin], Beet [beta vulgaris], and Liver  Review of Systems Review of Systems As noted in HPI  Physical Exam Vital Signs  I have reviewed the triage vital signs BP (!) 143/81   Pulse (!) 109   Temp 98.2 F (36.8 C)   Resp (!) 22   Ht 6' 2 (1.88 m)   Wt 127 kg   SpO2 93% Comment: 8L [place]  BMI 35.95 kg/m  *** Physical Exam  ED Results and Treatments Labs (all labs ordered are listed, but only abnormal results are displayed) Labs Reviewed  RESP PANEL BY RT-PCR (RSV, FLU A&B, COVID)  RVPGX2  BASIC METABOLIC PANEL WITH GFR  CBC WITH DIFFERENTIAL/PLATELET  BLOOD GAS, VENOUS  PRO BRAIN NATRIURETIC PEPTIDE                                                                                                                         EKG  EKG Interpretation Date/Time:    Ventricular Rate:    PR Interval:    QRS Duration:    QT Interval:    QTC Calculation:   R Axis:      Text Interpretation:         Radiology No results found.  Medications Ordered in ED Medications  methylPREDNISolone  sodium succinate (SOLU-MEDROL ) 125 mg/2 mL injection 125 mg (has no administration in time range)  ipratropium-albuterol  (DUONEB) 0.5-2.5 (3) MG/3ML nebulizer solution 3 mL (has no administration in time range)  magnesium  sulfate IVPB 2 g 50 mL (has no administration in time range)   Procedures Procedures  (including critical care time) Medical Decision Making / ED Course   Medical Decision Making   ***    Final Clinical Impression(s) / ED Diagnoses Final diagnoses:  None    This  chart was dictated using voice recognition software.  Despite best efforts to proofread,  errors can occur which can change the documentation meaning.

## 2023-11-23 NOTE — ED Triage Notes (Signed)
 73 y/o male comes in c/o SOB for the past 3-4 days. Per EMS, pts initial oxygen  saturation was 88% on his baseline 8L [place]. Pt was given one Duoneb treatment with some relief. On arrival, labored breathing noted with retractions and pt is unable to speak in full sentences with out taking a breath. Pt denies any recent sick contacts, chest pain, fevers, chills, n/v/d . Dr. Cottie at bedside and respiratory notified

## 2023-11-23 NOTE — ED Provider Notes (Signed)
 [place] EMERGENCY DEPARTMENT AT [facility] Provider Note  CSN: [id number] Arrival date & time: 11/23/23 2241  Chief Complaint(s) Shortness of Breath  HPI Mario [name] is a 73 y.o. male {Add pertinent medical, surgical, social history, OB history to HPI:1}    Shortness of Breath   Past Medical History Past Medical History:  Diagnosis Date   Ankylosing spondylitis lumbar region (HCC) 09/25/2022   Anxiety    Bronchitis    COPD (chronic obstructive pulmonary disease) (HCC)    Depression    History of falling 07/29/2021   history of Pulmonary embolism (HCC) 07/02/2022   History of radiation therapy    Left lung- 10/05/20-10/15/20- Dr. Lynwood Nasuti   Hypertension    lung ca 09/2020   MI (myocardial infarction) [facility])    ????   On home oxygen  therapy    4L/min [place]   OSA (obstructive sleep apnea)    Suicidal ideation 06/23/2019   Suicide attempt (HCC)    Tension pneumothorax 06/27/2016   Uveitis    Patient Active Problem List   Diagnosis Date Noted   DNR (do not resuscitate)/DNI(Do not intubate) 05/24/2023   Acute on chronic respiratory failure with hypoxia (HCC) - chronically on 5 L/min home O2. 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 ENT consulted. nasal pack  placed. 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 09/21/2020   Constipation    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   Stage 3a chronic kidney disease (HCC) 12/26/2019   COPD (chronic obstructive pulmonary disease) (HCC) - Pt frequently wheezes after physical exertion. This does NOT mean he has a COPD exacerbation.    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 Authorizing Provider  acetaminophen  (TYLENOL ) 500 MG tablet Take 1,000 mg by mouth every 6 (six) hours as needed for mild pain (pain score 1-3), moderate pain (pain score 4-6) or headache.    [provider]  albuterol  (PROVENTIL ) (2.5 MG/3ML) 0.083% nebulizer solution Take 2.5 mg by nebulization every 6 (six) hours as needed for wheezing or shortness of breath.    [provider]  albuterol  (VENTOLIN  HFA) 108 (90 Base) MCG/ACT inhaler Inhale 2 puffs into the lungs every 4 (four) hours as needed for wheezing or shortness of breath. 11/04/21   Cheryle Page, MD  aspirin  EC 81 MG tablet Take 81 mg by mouth daily. Swallow whole.    [provider]  azelastine  (ASTELIN ) 0.1 % nasal spray Place 1 spray into both nostrils 2 (two) times daily as needed for allergies or rhinitis (or post-nasal drip).    [provider]  bisacodyl  (DULCOLAX) 5 MG EC tablet Take 2 tablets (10 mg total) by mouth daily as needed for moderate constipation. 01/26/23   Armenta Canning, MD  BLACK  CURRANT SEED OIL PO Take 2 capsules by mouth in the morning.    [provider]  buPROPion  (WELLBUTRIN  XL) 300 MG 24 hr tablet Take 300 mg by mouth every morning. 02/05/20   [provider]  busPIRone  (BUSPAR ) 15 MG tablet Take 15 mg by mouth in the morning.    [provider]  Calcium  Carbonate (CALCIUM  500 PO) Take 500 mg by mouth in the morning.    [provider]  Carboxymethylcellulose Sod PF 0.5 % SOLN Place 1 drop into both eyes 4 (four) times daily as needed (dry eyes).    [provider]  cholecalciferol  (VITAMIN D3) 25 MCG (1000 UNIT) tablet Take 1,000 Units by mouth daily.    [provider]  Cyanocobalamin (VITAMIN B12 PO) Take 1 tablet by mouth in the morning.    [provider]  cyclobenzaprine  (FLEXERIL ) 10 MG tablet Take 20 mg by mouth at bedtime.    [provider]  cycloSPORINE  (RESTASIS ) 0.05 % ophthalmic emulsion Place 1 drop into both eyes every 12 (twelve) hours.    [provider]  diltiazem  (CARDIZEM  CD) 120 MG 24 hr capsule Take 1 capsule (120 mg total) by mouth daily. 05/25/23 08/23/23  Laurence Locus, DO  docusate sodium  (COLACE) 100 MG capsule Take 200 mg by mouth 2 (two) times daily.    [provider]  DULoxetine  (CYMBALTA ) 60 MG capsule Take 60 mg by mouth 2 (two) times daily.    [provider]  ferrous sulfate  325 (65 FE) MG tablet Take 325 mg by mouth daily with breakfast.    [provider]  fluticasone  (FLONASE ) 50 MCG/ACT nasal spray Place 2 sprays into both nostrils in the morning.    [provider]  folic acid  (FOLVITE ) 1 MG tablet Take 1 mg by mouth daily.    [provider]  gabapentin  (NEURONTIN ) 100 MG capsule Take 100 mg by mouth 3 (three) times daily.    [provider]  GARLIC OIL PO Take 1 capsule by mouth in the morning.    [provider]  Glucerna (GLUCERNA) LIQD Take 237 mLs by mouth See admin instructions.  Glucerna Therapeutic Shake chocolate- Drink 1 bottle by mouth once a day    [provider]  HYDROcodone -acetaminophen  (NORCO) 10-325 MG tablet Take 1 tablet by mouth every 6 (six) hours as needed for moderate pain (pain score 4-6).    [provider]  KRILL OIL PO Take 2 capsules by mouth daily.    [provider]  lactulose  (CHRONULAC ) 10 GM/15ML solution Take 20 g by mouth 2 (two) times daily as needed for mild constipation.    [provider]  lidocaine  (XYLOCAINE ) 5 % ointment Apply 1 Application topically 2 (two) times daily as needed (for pain- affected sites).    [provider]  magnesium  citrate SOLN Take 0.5 Bottles by mouth daily as needed for severe constipation.    [provider]  Magnesium  Oxide (MAG-OXIDE PO) Take 1 tablet by mouth daily.  [provider]  Melatonin 3 MG CAPS Take 6 mg by mouth at bedtime.    [provider]  metFORMIN  (GLUCOPHAGE ) 850 MG tablet Take 850 mg by mouth daily after breakfast.    [provider]  Misc Natural Products (TURMERIC CURCUMIN) CAPS Take 3 capsules by mouth See admin instructions. Turmeric/Bioperine/Ginger/Garlic- Take 3 capsules by mouth once a day    [provider]  Mometasone  Furoate (ASMANEX  HFA) 200 MCG/ACT AERO Inhale 2 puffs into the lungs daily as needed (for severe COPD symptoms (when tolerated) and RINSE MOUTH VERY WELL WITH WATER AFTERWARDS).    [provider]  Multiple Vitamin (MULTIVITAMIN) tablet Take 1 tablet by mouth daily with breakfast.    [provider]  NON FORMULARY Place 1 application  into both nostrils See admin instructions. InstaCure's Original Nose Balm with Manuka Honey- Place into the affected nasal passage twice a day as needed for irritation    [provider]  NON FORMULARY Take 2-4 tablets by mouth See admin instructions. ZzzQuil PURE Zzzs Melatonin Gummies- Chew 2 to 4 gummies by mouth at  bedtime    [provider]  omeprazole  (PRILOSEC) 20 MG capsule Take 1 capsule (20 mg total) by mouth daily. Patient taking differently: Take 20 mg by mouth daily as needed (for reflux symptoms). 01/26/23   Armenta Canning, MD  ondansetron  (ZOFRAN -ODT) 4 MG disintegrating tablet Take 1 tablet (4 mg total) by mouth every 8 (eight) hours as needed for nausea or vomiting. Patient taking differently: Take 4 mg by mouth every 8 (eight) hours as needed for nausea or vomiting (dissolve orally). 05/23/23   Will Almarie MATSU, MD  Oxcarbazepine  (TRILEPTAL ) 300 MG tablet Take 600 mg by mouth in the morning, at noon, and at bedtime.    [provider]  OXYGEN  Inhale 6 L/min into the lungs continuous.    [provider]  polyethylene glycol powder (GLYCOLAX /MIRALAX ) 17 GM/SCOOP powder Take 17 g by mouth in the morning.    [provider]  psyllium (HYDROCIL/METAMUCIL) 95 % PACK Take 1 packet by mouth daily. Patient not taking: Reported on 06/10/2023 04/02/23   Gherghe, Costin M, MD  senna (SENOKOT) 8.6 MG TABS tablet Take 2 tablets (17.2 mg total) by mouth 2 (two) times daily. Patient taking differently: Take 1 tablet by mouth daily. 05/23/23   Will Almarie MATSU, MD  sodium chloride  (OCEAN) 0.65 % SOLN nasal spray Place 1 spray into both nostrils daily at 2 am. Patient taking differently: Place 1 spray into both nostrils as needed for congestion. 04/24/23   Will Almarie MATSU, MD  tamsulosin  (FLOMAX ) 0.4 MG CAPS capsule Take 0.8 mg by mouth in the morning.    [provider]  Tiotropium Bromide -Olodaterol 2.5-2.5 MCG/ACT AERS Inhale 2 puffs into the lungs every morning. 2 puffs    [provider]  traZODone  (DESYREL ) 50 MG tablet Take 75 mg by mouth at bedtime.    [provider]  Allergies Demerol [meperidine], Zocor  [simvastatin], Beet [beta vulgaris], and Liver  Review of Systems Review of Systems  Respiratory:  Positive for shortness of breath.    As noted in HPI  Physical Exam Vital Signs  I have reviewed the triage vital signs BP (!) 143/81   Pulse (!) 109   Temp 98.2 F (36.8 C)   Resp (!) 22   Ht 6' 2 (1.88 m)   Wt 127 kg   SpO2 93% Comment: 8L [place]  BMI 35.95 kg/m  *** Physical Exam  ED Results and Treatments Labs (all labs ordered are listed, but only abnormal results are displayed) Labs Reviewed  RESP PANEL BY RT-PCR (RSV, FLU A&B, COVID)  RVPGX2  BASIC METABOLIC PANEL WITH GFR  CBC WITH DIFFERENTIAL/PLATELET  BLOOD GAS, VENOUS  PRO BRAIN NATRIURETIC PEPTIDE                                                                                                                         EKG  EKG Interpretation Date/Time:    Ventricular Rate:    PR Interval:    QRS Duration:    QT Interval:    QTC Calculation:   R Axis:      Text Interpretation:         Radiology No results found.  Medications Ordered in ED Medications  methylPREDNISolone  sodium succinate (SOLU-MEDROL ) 125 mg/2 mL injection 125 mg (has no administration in time range)  ipratropium-albuterol  (DUONEB) 0.5-2.5 (3) MG/3ML nebulizer solution 3 mL (has no administration in time range)  magnesium  sulfate IVPB 2 g 50 mL (has no administration in time range)   Procedures Procedures  (including critical care time) Medical Decision Making / ED Course   Medical Decision Making   ***    Final Clinical Impression(s) / ED Diagnoses Final diagnoses:  None    This chart was dictated using voice recognition software.  Despite best efforts to proofread,  errors can occur which can change the documentation meaning.

## 2023-11-23 NOTE — Progress Notes (Signed)
   11/23/23 2332  BiPAP/CPAP/SIPAP  BiPAP/CPAP/SIPAP Pt Type Adult  BiPAP/CPAP/SIPAP V60  Mask Type Full face mask  Dentures removed? Not applicable  Mask Size Large  IPAP 12 cmH20 (Pt requesting more pressure)  EPAP 5 cmH2O  FiO2 (%) 50 %  Minute Ventilation 16.7  Leak 30  Peak Inspiratory Pressure (PIP) 12  Tidal Volume (Vt) 712  Patient Home Machine No  Patient Home Mask No  Patient Home Tubing No  Auto Titrate No  Press High Alarm 30 cmH2O  Press Low Alarm 5 cmH2O  Device Plugged into [employer] Power Outlet Yes

## 2023-11-24 DIAGNOSIS — I252 Old myocardial infarction: Secondary | ICD-10-CM | POA: Diagnosis not present

## 2023-11-24 DIAGNOSIS — R000 Tachycardia, unspecified: Secondary | ICD-10-CM | POA: Diagnosis not present

## 2023-11-24 DIAGNOSIS — Z7189 Other specified counseling: Secondary | ICD-10-CM | POA: Diagnosis not present

## 2023-11-24 DIAGNOSIS — J441 Chronic obstructive pulmonary disease with (acute) exacerbation: Secondary | ICD-10-CM | POA: Diagnosis present

## 2023-11-24 DIAGNOSIS — J9620 Acute and chronic respiratory failure, unspecified whether with hypoxia or hypercapnia: Secondary | ICD-10-CM

## 2023-11-24 DIAGNOSIS — Z9981 Dependence on supplemental oxygen: Secondary | ICD-10-CM | POA: Diagnosis not present

## 2023-11-24 DIAGNOSIS — M456 Ankylosing spondylitis lumbar region: Secondary | ICD-10-CM | POA: Diagnosis present

## 2023-11-24 DIAGNOSIS — Z6836 Body mass index (BMI) 36.0-36.9, adult: Secondary | ICD-10-CM | POA: Diagnosis not present

## 2023-11-24 DIAGNOSIS — N183 Chronic kidney disease, stage 3 unspecified: Secondary | ICD-10-CM | POA: Diagnosis not present

## 2023-11-24 DIAGNOSIS — I152 Hypertension secondary to endocrine disorders: Secondary | ICD-10-CM | POA: Diagnosis present

## 2023-11-24 DIAGNOSIS — I872 Venous insufficiency (chronic) (peripheral): Secondary | ICD-10-CM | POA: Diagnosis present

## 2023-11-24 DIAGNOSIS — F419 Anxiety disorder, unspecified: Secondary | ICD-10-CM | POA: Diagnosis present

## 2023-11-24 DIAGNOSIS — N400 Enlarged prostate without lower urinary tract symptoms: Secondary | ICD-10-CM | POA: Diagnosis present

## 2023-11-24 DIAGNOSIS — Z23 Encounter for immunization: Secondary | ICD-10-CM | POA: Diagnosis not present

## 2023-11-24 DIAGNOSIS — E1159 Type 2 diabetes mellitus with other circulatory complications: Secondary | ICD-10-CM | POA: Diagnosis present

## 2023-11-24 DIAGNOSIS — J90 Pleural effusion, not elsewhere classified: Secondary | ICD-10-CM | POA: Diagnosis present

## 2023-11-24 DIAGNOSIS — Z515 Encounter for palliative care: Secondary | ICD-10-CM | POA: Diagnosis not present

## 2023-11-24 DIAGNOSIS — E1165 Type 2 diabetes mellitus with hyperglycemia: Secondary | ICD-10-CM | POA: Diagnosis present

## 2023-11-24 DIAGNOSIS — E1140 Type 2 diabetes mellitus with diabetic neuropathy, unspecified: Secondary | ICD-10-CM | POA: Diagnosis present

## 2023-11-24 DIAGNOSIS — N1831 Chronic kidney disease, stage 3a: Secondary | ICD-10-CM | POA: Diagnosis present

## 2023-11-24 DIAGNOSIS — Z1152 Encounter for screening for COVID-19: Secondary | ICD-10-CM | POA: Diagnosis not present

## 2023-11-24 DIAGNOSIS — Z66 Do not resuscitate: Secondary | ICD-10-CM | POA: Diagnosis present

## 2023-11-24 DIAGNOSIS — I129 Hypertensive chronic kidney disease with stage 1 through stage 4 chronic kidney disease, or unspecified chronic kidney disease: Secondary | ICD-10-CM | POA: Diagnosis present

## 2023-11-24 DIAGNOSIS — G4733 Obstructive sleep apnea (adult) (pediatric): Secondary | ICD-10-CM | POA: Diagnosis present

## 2023-11-24 DIAGNOSIS — E66812 Obesity, class 2: Secondary | ICD-10-CM | POA: Diagnosis present

## 2023-11-24 DIAGNOSIS — F339 Major depressive disorder, recurrent, unspecified: Secondary | ICD-10-CM | POA: Diagnosis present

## 2023-11-24 DIAGNOSIS — E1122 Type 2 diabetes mellitus with diabetic chronic kidney disease: Secondary | ICD-10-CM | POA: Diagnosis present

## 2023-11-24 LAB — CBC WITH DIFFERENTIAL/PLATELET
Abs Immature Granulocytes: 0.05 K/uL (ref 0.00–0.07)
Basophils Absolute: 0.1 K/uL (ref 0.0–0.1)
Basophils Relative: 0 %
Eosinophils Absolute: 0.3 K/uL (ref 0.0–0.5)
Eosinophils Relative: 2 %
HCT: 42.4 % (ref 39.0–52.0)
Hemoglobin: 13.6 g/dL (ref 13.0–17.0)
Immature Granulocytes: 0 %
Lymphocytes Relative: 16 %
Lymphs Abs: 1.9 K/uL (ref 0.7–4.0)
MCH: 29.5 pg (ref 26.0–34.0)
MCHC: 32.1 g/dL (ref 30.0–36.0)
MCV: 92 fL (ref 80.0–100.0)
Monocytes Absolute: 1.1 K/uL — ABNORMAL HIGH (ref 0.1–1.0)
Monocytes Relative: 9 %
Neutro Abs: 8.9 K/uL — ABNORMAL HIGH (ref 1.7–7.7)
Neutrophils Relative %: 73 %
Platelets: 414 K/uL — ABNORMAL HIGH (ref 150–400)
RBC: 4.61 MIL/uL (ref 4.22–5.81)
RDW: 14.9 % (ref 11.5–15.5)
WBC: 12.2 K/uL — ABNORMAL HIGH (ref 4.0–10.5)
nRBC: 0 % (ref 0.0–0.2)

## 2023-11-24 LAB — CBC
HCT: 43.2 % (ref 39.0–52.0)
Hemoglobin: 13.6 g/dL (ref 13.0–17.0)
MCH: 28.8 pg (ref 26.0–34.0)
MCHC: 31.5 g/dL (ref 30.0–36.0)
MCV: 91.5 fL (ref 80.0–100.0)
Platelets: 397 K/uL (ref 150–400)
RBC: 4.72 MIL/uL (ref 4.22–5.81)
RDW: 15 % (ref 11.5–15.5)
WBC: 13.2 K/uL — ABNORMAL HIGH (ref 4.0–10.5)
nRBC: 0 % (ref 0.0–0.2)

## 2023-11-24 LAB — BASIC METABOLIC PANEL WITH GFR
Anion gap: 13 (ref 5–15)
BUN: 12 mg/dL (ref 8–23)
CO2: 25 mmol/L (ref 22–32)
Calcium: 9.3 mg/dL (ref 8.9–10.3)
Chloride: 100 mmol/L (ref 98–111)
Creatinine, Ser: 1.01 mg/dL (ref 0.61–1.24)
GFR, Estimated: >60 mL/min (ref >60)
Glucose, Bld: 147 mg/dL — ABNORMAL HIGH (ref 70–99)
Potassium: 4.3 mmol/L (ref 3.5–5.1)
Sodium: 138 mmol/L (ref 135–145)

## 2023-11-24 LAB — RESP PANEL BY RT-PCR (RSV, FLU A&B, COVID)  RVPGX2
Influenza A by PCR: NEGATIVE
Influenza B by PCR: NEGATIVE
Resp Syncytial Virus by PCR: NEGATIVE
SARS Coronavirus 2 by RT PCR: NEGATIVE

## 2023-11-24 LAB — COMPREHENSIVE METABOLIC PANEL WITH GFR
ALT: 12 U/L (ref 0–44)
AST: 21 U/L (ref 15–41)
Albumin: 3.9 g/dL (ref 3.5–5.0)
Alkaline Phosphatase: 113 U/L (ref 38–126)
Anion gap: 13 (ref 5–15)
BUN: 16 mg/dL (ref 8–23)
CO2: 22 mmol/L (ref 22–32)
Calcium: 9.3 mg/dL (ref 8.9–10.3)
Chloride: 100 mmol/L (ref 98–111)
Creatinine, Ser: 1.02 mg/dL (ref 0.61–1.24)
GFR, Estimated: >60 mL/min (ref >60)
Glucose, Bld: 225 mg/dL — ABNORMAL HIGH (ref 70–99)
Potassium: 4.6 mmol/L (ref 3.5–5.1)
Sodium: 135 mmol/L (ref 135–145)
Total Bilirubin: 0.2 mg/dL (ref 0.0–1.2)
Total Protein: 7.1 g/dL (ref 6.5–8.1)

## 2023-11-24 LAB — RESPIRATORY PANEL BY PCR

## 2023-11-24 LAB — PRO BRAIN NATRIURETIC PEPTIDE: Pro Brain Natriuretic Peptide: 85.2 pg/mL (ref <300.0)

## 2023-11-24 LAB — PROCALCITONIN: Procalcitonin: <0.1 ng/mL

## 2023-11-24 LAB — MRSA NEXT GEN BY PCR, NASAL: MRSA by PCR Next Gen: DETECTED — AB

## 2023-11-24 LAB — GLUCOSE, CAPILLARY
Glucose-Capillary: 207 mg/dL — ABNORMAL HIGH (ref 70–99)
Glucose-Capillary: 122 mg/dL — ABNORMAL HIGH (ref 70–99)

## 2023-11-24 MED ORDER — BISACODYL 5 MG PO TBEC: 5.0000 mg | DELAYED_RELEASE_TABLET | Freq: Every day | ORAL | Status: DC | PRN

## 2023-11-24 MED ORDER — ENSURE PLUS HIGH PROTEIN PO LIQD
237.0000 mL | Freq: Two times a day (BID) | ORAL | Status: DC
  Administered 2023-11-25 – 2023-11-28 (×7): 237 mL via ORAL

## 2023-11-24 MED ORDER — METHYLPREDNISOLONE SODIUM SUCC 40 MG IJ SOLR
40.0000 mg | Freq: Two times a day (BID) | INTRAMUSCULAR | Status: AC
  Administered 2023-11-24 – 2023-11-26 (×6): 40 mg via INTRAVENOUS
  Filled 2023-11-24 (×5): qty 1

## 2023-11-24 MED ORDER — POLYETHYLENE GLYCOL 3350 17 G PO PACK: 17.0000 g | PACK | Freq: Every day | ORAL | Status: DC | PRN

## 2023-11-24 MED ORDER — CEFEPIME HCL 2 G IV SOLR
2.0000 g | Freq: Three times a day (TID) | INTRAVENOUS | Status: DC
  Administered 2023-11-24 – 2023-11-28 (×11): 2 g via INTRAVENOUS
  Filled 2023-11-24 (×11): qty 12.5

## 2023-11-24 MED ORDER — DOCUSATE SODIUM 100 MG PO CAPS
100.0000 mg | ORAL_CAPSULE | Freq: Two times a day (BID) | ORAL | Status: DC
  Filled 2023-11-24: qty 1

## 2023-11-24 MED ORDER — DULOXETINE HCL 60 MG PO CPEP
60.0000 mg | ORAL_CAPSULE | Freq: Two times a day (BID) | ORAL | Status: DC
Start: 2023-11-24 — End: 2023-11-28
  Administered 2023-11-24 – 2023-11-28 (×8): 60 mg via ORAL
  Filled 2023-11-24: qty 2
  Filled 2023-11-24 (×2): qty 1
  Filled 2023-11-24: qty 2
  Filled 2023-11-24: qty 1
  Filled 2023-11-24: qty 2
  Filled 2023-11-24: qty 1
  Filled 2023-11-24: qty 2

## 2023-11-24 MED ORDER — FLUTICASONE PROPIONATE 50 MCG/ACT NA SUSP
2.0000 | Freq: Every morning | NASAL | Status: DC
  Administered 2023-11-25 – 2023-11-28 (×4): 2 via NASAL
  Filled 2023-11-24: qty 16

## 2023-11-24 MED ORDER — OXCARBAZEPINE 300 MG PO TABS
600.0000 mg | ORAL_TABLET | Freq: Three times a day (TID) | ORAL | Status: DC
  Administered 2023-11-24 – 2023-11-28 (×11): 600 mg via ORAL
  Filled 2023-11-24 (×14): qty 2

## 2023-11-24 MED ORDER — HYDRALAZINE HCL 20 MG/ML IJ SOLN: 5.0000 mg | INTRAMUSCULAR | Status: DC | PRN

## 2023-11-24 MED ORDER — ADULT MULTIVITAMIN W/MINERALS CH
1.0000 | ORAL_TABLET | Freq: Every day | ORAL | Status: DC
  Administered 2023-11-24 – 2023-11-28 (×5): 1 via ORAL
  Filled 2023-11-24 (×4): qty 1

## 2023-11-24 MED ORDER — LEVALBUTEROL HCL 0.63 MG/3ML IN NEBU: 0.6300 mg | INHALATION_SOLUTION | Freq: Four times a day (QID) | RESPIRATORY_TRACT | Status: DC | PRN

## 2023-11-24 MED ORDER — HYDROCODONE-ACETAMINOPHEN 10-325 MG PO TABS
1.0000 | ORAL_TABLET | Freq: Four times a day (QID) | ORAL | Status: DC | PRN
Start: 2023-11-24 — End: 2023-11-28

## 2023-11-24 MED ORDER — IPRATROPIUM-ALBUTEROL 0.5-2.5 (3) MG/3ML IN SOLN
3.0000 mL | Freq: Four times a day (QID) | RESPIRATORY_TRACT | Status: DC
  Administered 2023-11-24 (×3): 3 mL via RESPIRATORY_TRACT
  Filled 2023-11-24 (×3): qty 3

## 2023-11-24 MED ORDER — ONDANSETRON HCL 4 MG/2ML IJ SOLN
4.0000 mg | Freq: Four times a day (QID) | INTRAMUSCULAR | Status: DC | PRN
  Filled 2023-11-24: qty 2

## 2023-11-24 MED ORDER — SODIUM CHLORIDE 0.9 % IV SOLN
500.0000 mg | INTRAVENOUS | Status: DC
  Administered 2023-11-24: 500 mg via INTRAVENOUS
  Filled 2023-11-24: qty 5

## 2023-11-24 MED ORDER — INSULIN ASPART 100 UNIT/ML IJ SOLN
0.0000 [IU] | Freq: Three times a day (TID) | INTRAMUSCULAR | Status: DC
  Administered 2023-11-24: 5 [IU] via SUBCUTANEOUS
  Administered 2023-11-25 (×2): 3 [IU] via SUBCUTANEOUS
  Administered 2023-11-25 – 2023-11-26 (×4): 2 [IU] via SUBCUTANEOUS
  Administered 2023-11-27: 3 [IU] via SUBCUTANEOUS
  Administered 2023-11-27 (×2): 2 [IU] via SUBCUTANEOUS
  Administered 2023-11-28: 3 [IU] via SUBCUTANEOUS

## 2023-11-24 MED ORDER — ACETAMINOPHEN 650 MG RE SUPP: 650.0000 mg | Freq: Four times a day (QID) | RECTAL | Status: DC | PRN

## 2023-11-24 MED ORDER — FUROSEMIDE 10 MG/ML IJ SOLN
40.0000 mg | Freq: Once | INTRAMUSCULAR | Status: AC
Start: 2023-11-24 — End: 2023-11-24
  Administered 2023-11-24: 40 mg via INTRAVENOUS
  Filled 2023-11-24: qty 4

## 2023-11-24 MED ORDER — PANTOPRAZOLE SODIUM 40 MG PO TBEC
40.0000 mg | DELAYED_RELEASE_TABLET | Freq: Every day | ORAL | Status: DC
  Administered 2023-11-25 – 2023-11-28 (×4): 40 mg via ORAL
  Filled 2023-11-24 (×4): qty 1

## 2023-11-24 MED ORDER — SODIUM CHLORIDE 0.9 % IV SOLN
500.0000 mg | INTRAVENOUS | Status: AC
  Administered 2023-11-25 – 2023-11-26 (×2): 500 mg via INTRAVENOUS
  Filled 2023-11-24 (×2): qty 5

## 2023-11-24 MED ORDER — ARFORMOTEROL TARTRATE 15 MCG/2ML IN NEBU
15.0000 ug | INHALATION_SOLUTION | Freq: Two times a day (BID) | RESPIRATORY_TRACT | Status: DC
  Administered 2023-11-24 – 2023-11-28 (×8): 15 ug via RESPIRATORY_TRACT
  Filled 2023-11-24 (×8): qty 2

## 2023-11-24 MED ORDER — ALBUTEROL SULFATE (2.5 MG/3ML) 0.083% IN NEBU: 2.5000 mg | INHALATION_SOLUTION | RESPIRATORY_TRACT | Status: DC | PRN

## 2023-11-24 MED ORDER — CYCLOSPORINE 0.05 % OP EMUL
1.0000 [drp] | Freq: Two times a day (BID) | OPHTHALMIC | Status: DC
  Administered 2023-11-24 – 2023-11-28 (×8): 1 [drp] via OPHTHALMIC
  Filled 2023-11-24 (×9): qty 30

## 2023-11-24 MED ORDER — IPRATROPIUM-ALBUTEROL 0.5-2.5 (3) MG/3ML IN SOLN: 3.0000 mL | Freq: Four times a day (QID) | RESPIRATORY_TRACT | Status: DC | PRN

## 2023-11-24 MED ORDER — BISACODYL 5 MG PO TBEC: 10.0000 mg | DELAYED_RELEASE_TABLET | Freq: Every day | ORAL | Status: DC | PRN

## 2023-11-24 MED ORDER — CHLORHEXIDINE GLUCONATE CLOTH 2 % EX PADS
6.0000 | MEDICATED_PAD | Freq: Every day | CUTANEOUS | Status: DC
Start: 2023-11-24 — End: 2023-11-28
  Administered 2023-11-24: 6 via TOPICAL

## 2023-11-24 MED ORDER — DOCUSATE SODIUM 100 MG PO CAPS
200.0000 mg | ORAL_CAPSULE | Freq: Two times a day (BID) | ORAL | Status: DC
  Administered 2023-11-24 – 2023-11-28 (×8): 200 mg via ORAL
  Filled 2023-11-24 (×8): qty 2

## 2023-11-24 MED ORDER — ONDANSETRON HCL 4 MG PO TABS: 4.0000 mg | ORAL_TABLET | Freq: Four times a day (QID) | ORAL | Status: DC | PRN

## 2023-11-24 MED ORDER — ASPIRIN 81 MG PO TBEC
81.0000 mg | DELAYED_RELEASE_TABLET | Freq: Every day | ORAL | Status: DC
  Administered 2023-11-25 – 2023-11-28 (×4): 81 mg via ORAL
  Filled 2023-11-24 (×4): qty 1

## 2023-11-24 MED ORDER — FOLIC ACID 1 MG PO TABS
1.0000 mg | ORAL_TABLET | Freq: Every day | ORAL | Status: DC
  Administered 2023-11-25 – 2023-11-28 (×4): 1 mg via ORAL
  Filled 2023-11-24 (×4): qty 1

## 2023-11-24 MED ORDER — TRAZODONE HCL 50 MG PO TABS
75.0000 mg | ORAL_TABLET | Freq: Every day | ORAL | Status: DC
  Administered 2023-11-24 – 2023-11-27 (×4): 75 mg via ORAL
  Filled 2023-11-24 (×4): qty 2

## 2023-11-24 MED ORDER — CYCLOBENZAPRINE HCL 10 MG PO TABS
20.0000 mg | ORAL_TABLET | Freq: Every day | ORAL | Status: DC
  Administered 2023-11-24 – 2023-11-27 (×4): 20 mg via ORAL
  Filled 2023-11-24 (×4): qty 2

## 2023-11-24 MED ORDER — FERROUS SULFATE 325 (65 FE) MG PO TABS
325.0000 mg | ORAL_TABLET | Freq: Every day | ORAL | Status: DC
Start: 2023-11-25 — End: 2023-11-28
  Administered 2023-11-25 – 2023-11-28 (×4): 325 mg via ORAL
  Filled 2023-11-24 (×4): qty 1

## 2023-11-24 MED ORDER — ENOXAPARIN SODIUM 40 MG/0.4ML IJ SOSY
40.0000 mg | PREFILLED_SYRINGE | INTRAMUSCULAR | Status: DC
  Administered 2023-11-24 – 2023-11-28 (×5): 40 mg via SUBCUTANEOUS
  Filled 2023-11-24 (×5): qty 0.4

## 2023-11-24 MED ORDER — DILTIAZEM HCL ER COATED BEADS 120 MG PO CP24
120.0000 mg | ORAL_CAPSULE | Freq: Every day | ORAL | Status: DC
  Administered 2023-11-25 – 2023-11-28 (×4): 120 mg via ORAL
  Filled 2023-11-24 (×4): qty 1

## 2023-11-24 MED ORDER — BUSPIRONE HCL 5 MG PO TABS
15.0000 mg | ORAL_TABLET | Freq: Every morning | ORAL | Status: DC
  Administered 2023-11-25 – 2023-11-28 (×4): 15 mg via ORAL
  Filled 2023-11-24: qty 3
  Filled 2023-11-24 (×2): qty 1
  Filled 2023-11-24: qty 3

## 2023-11-24 MED ORDER — SENNA 8.6 MG PO TABS
1.0000 | ORAL_TABLET | Freq: Every day | ORAL | Status: DC
  Administered 2023-11-24 – 2023-11-28 (×5): 8.6 mg via ORAL
  Filled 2023-11-24 (×5): qty 1

## 2023-11-24 MED ORDER — MAGNESIUM CITRATE PO SOLN: 0.5000 | Freq: Every day | ORAL | Status: DC | PRN

## 2023-11-24 MED ORDER — GABAPENTIN 100 MG PO CAPS
100.0000 mg | ORAL_CAPSULE | Freq: Three times a day (TID) | ORAL | Status: DC
Start: 2023-11-24 — End: 2023-11-28
  Administered 2023-11-24 – 2023-11-28 (×11): 100 mg via ORAL
  Filled 2023-11-24 (×11): qty 1

## 2023-11-24 MED ORDER — REVEFENACIN 175 MCG/3ML IN SOLN
175.0000 ug | Freq: Every day | RESPIRATORY_TRACT | Status: DC
  Administered 2023-11-24 – 2023-11-28 (×5): 175 ug via RESPIRATORY_TRACT
  Filled 2023-11-24 (×5): qty 3

## 2023-11-24 MED ORDER — BUPROPION HCL ER (XL) 300 MG PO TB24
300.0000 mg | ORAL_TABLET | Freq: Every morning | ORAL | Status: DC
  Administered 2023-11-25 – 2023-11-28 (×4): 300 mg via ORAL
  Filled 2023-11-24 (×4): qty 1

## 2023-11-24 MED ORDER — SODIUM CHLORIDE 0.9 % IV SOLN
2.0000 g | Freq: Three times a day (TID) | INTRAVENOUS | Status: DC
  Administered 2023-11-24 (×2): 2 g via INTRAVENOUS
  Filled 2023-11-24 (×2): qty 12.5

## 2023-11-24 MED ORDER — PREDNISONE 20 MG PO TABS
40.0000 mg | ORAL_TABLET | Freq: Every day | ORAL | Status: DC
Start: 2023-11-27 — End: 2023-11-28
  Administered 2023-11-27 – 2023-11-28 (×2): 40 mg via ORAL
  Filled 2023-11-24 (×2): qty 2

## 2023-11-24 MED ORDER — MORPHINE SULFATE (PF) 2 MG/ML IV SOLN: 2.0000 mg | INTRAVENOUS | Status: DC | PRN

## 2023-11-24 MED ORDER — IPRATROPIUM-ALBUTEROL 0.5-2.5 (3) MG/3ML IN SOLN
3.0000 mL | RESPIRATORY_TRACT | Status: AC
  Administered 2023-11-24 (×2): 3 mL via RESPIRATORY_TRACT
  Filled 2023-11-24: qty 6

## 2023-11-24 MED ORDER — SODIUM CHLORIDE 0.9% FLUSH
3.0000 mL | Freq: Two times a day (BID) | INTRAVENOUS | Status: DC
  Administered 2023-11-24 – 2023-11-28 (×9): 3 mL via INTRAVENOUS

## 2023-11-24 MED ORDER — POLYVINYL ALCOHOL 1.4 % OP SOLN: 1.0000 [drp] | Freq: Four times a day (QID) | OPHTHALMIC | Status: DC | PRN

## 2023-11-24 MED ORDER — INFLUENZA VAC SPLIT HIGH-DOSE 0.5 ML IM SUSY
0.5000 mL | PREFILLED_SYRINGE | INTRAMUSCULAR | Status: AC
  Administered 2023-11-25: 0.5 mL via INTRAMUSCULAR
  Filled 2023-11-24: qty 0.5

## 2023-11-24 MED ORDER — ACETAMINOPHEN 325 MG PO TABS
650.0000 mg | ORAL_TABLET | Freq: Four times a day (QID) | ORAL | Status: DC | PRN
  Administered 2023-11-24 – 2023-11-26 (×3): 650 mg via ORAL
  Filled 2023-11-24 (×3): qty 2

## 2023-11-24 MED ORDER — BUDESONIDE 0.5 MG/2ML IN SUSP
0.5000 mg | Freq: Two times a day (BID) | RESPIRATORY_TRACT | Status: DC
Start: 2023-11-24 — End: 2023-11-28
  Administered 2023-11-24 – 2023-11-28 (×8): 0.5 mg via RESPIRATORY_TRACT
  Filled 2023-11-24 (×8): qty 2

## 2023-11-24 MED ORDER — GUAIFENESIN ER 600 MG PO TB12: 600.0000 mg | ORAL_TABLET | Freq: Two times a day (BID) | ORAL | Status: DC | PRN

## 2023-11-24 MED ORDER — TAMSULOSIN HCL 0.4 MG PO CAPS
0.8000 mg | ORAL_CAPSULE | Freq: Every morning | ORAL | Status: DC
  Administered 2023-11-25 – 2023-11-28 (×4): 0.8 mg via ORAL
  Filled 2023-11-24 (×4): qty 2

## 2023-11-24 NOTE — H&P (Signed)
 History and Physical    Patient: Mario [name] FMW:[id number] DOB: June 19, 1950 DOA: 11/23/2023 DOS: the patient was seen and examined on 11/24/2023 PCP: [name]  Patient coming from: Home - lives with housemate; NOK: Daughter, Melvin, [phone]   Chief Complaint: SOB  HPI: Mario [name] is a 73 y.o. male with medical history significant of ankylosing spondylitis, COPD on 6-8L home O2, depression with prior suicide attempt, PE, left lung CA s/p radiation, HTN, and OSA who presented on 9/25 with SOB. He reports that he has a productive cough with clear secretions lately.  The last 3 days, his oxygen  was dropping more than usual.   At baseline, he can barely walk through the house without getting very SOB.  Quit smoking in 2018 but his house mate does smoke.  No sick contacts.  No fever.  He is still coughing a lot and feels very winded.  He has not seen a pulmonologist outside of the [place] and has not been keeping his appointments with the [place] - too hard to walk from the living room to get in he van.  MOST form indicates desire for DNR.  He would like to move to ALF with assistance from the [place].    ER Course:  Respiratory distress due to COPD exacerbation, started on BIPAP and weaned to HFNC O2.  CXR with chronic opacities, negative procalcitonin, negative RVP.     Review of Systems: As mentioned in the history of present illness. All other systems reviewed and are negative. Past Medical History:  Diagnosis Date   Ankylosing spondylitis lumbar region [facility]) 09/25/2022   Anxiety    Bronchitis    COPD (chronic obstructive pulmonary disease) (HCC)    Depression    History of falling 07/29/2021   history of Pulmonary embolism (HCC) 07/02/2022   History of radiation therapy    Left lung- 10/05/20-10/15/20- Dr. Lynwood Nasuti   Hypertension    lung ca 09/2020   MI (myocardial infarction) [facility])    ????   On home oxygen  therapy    4L/min [place]   OSA (obstructive sleep  apnea)    Suicidal ideation 06/23/2019   Suicide attempt (HCC)    Tension pneumothorax 06/27/2016   Uveitis    Past Surgical History:  Procedure Laterality Date   BIOPSY  07/03/2021   Procedure: BIOPSY;  Surgeon: Elicia Claw, MD;  Location: [facility] ENDOSCOPY;  Service: Gastroenterology;;   BRONCHIAL BIOPSY  07/30/2020   Procedure: BRONCHIAL BIOPSIES;  Surgeon: Brenna Adine CROME, DO;  Location: [facility] ENDOSCOPY;  Service: Pulmonary;;   BRONCHIAL BRUSHINGS  07/30/2020   Procedure: BRONCHIAL BRUSHINGS;  Surgeon: Brenna Adine CROME, DO;  Location: [facility] ENDOSCOPY;  Service: Pulmonary;;   BRONCHIAL NEEDLE ASPIRATION BIOPSY  07/30/2020   Procedure: BRONCHIAL NEEDLE ASPIRATION BIOPSIES;  Surgeon: Brenna Adine CROME, DO;  Location: [facility] ENDOSCOPY;  Service: Pulmonary;;   BRONCHIAL WASHINGS  07/30/2020   Procedure: BRONCHIAL WASHINGS;  Surgeon: Brenna Adine CROME, DO;  Location: [facility] ENDOSCOPY;  Service: Pulmonary;;   BRONCHIAL WASHINGS  04/17/2023   Procedure: BRONCHIAL WASHINGS;  Surgeon: Claudene Toribio BROCKS, MD;  Location: [facility] ENDOSCOPY;  Service: Pulmonary;;   CHEST TUBE INSERTION Left 06/27/2016   CRYOTHERAPY  04/17/2023   Procedure: CRYOTHERAPY;  Surgeon: Claudene Toribio BROCKS, MD;  Location: [facility] ENDOSCOPY;  Service: Pulmonary;;   cryptorchidism     ESOPHAGOGASTRODUODENOSCOPY N/A 07/03/2021   Procedure: ESOPHAGOGASTRODUODENOSCOPY (EGD);  Surgeon: Elicia Claw, MD;  Location: [facility];  Service: Gastroenterology;  Laterality: N/A;  HEMOSTASIS CONTROL  04/17/2023   Procedure: HEMOSTASIS CONTROL;  Surgeon: Claudene Toribio BROCKS, MD;  Location: [facility] ENDOSCOPY;  Service: Pulmonary;;   IR PERC PLEURAL DRAIN W/INDWELL CATH W/IMG GUIDE  08/04/2021   IR REMOVAL OF PLURAL CATH W/CUFF  09/01/2021   SKIN CANCER EXCISION     VIDEO BRONCHOSCOPY Left 04/17/2023   Procedure: VIDEO BRONCHOSCOPY WITHOUT FLUORO;  Surgeon: Claudene Toribio BROCKS, MD;  Location: [facility] ENDOSCOPY;  Service: Pulmonary;  Laterality: Left;   VIDEO BRONCHOSCOPY WITH ENDOBRONCHIAL  NAVIGATION Left 07/30/2020   Procedure: VIDEO BRONCHOSCOPY WITH ENDOBRONCHIAL NAVIGATION;  Surgeon: Brenna Adine CROME, DO;  Location: [facility] ENDOSCOPY;  Service: Pulmonary;  Laterality: Left;   Social History:  reports that he quit smoking about 7 years ago. His smoking use included cigarettes. He started smoking about 42 years ago. He has a 35 pack-year smoking history. He has been exposed to tobacco smoke. He has never used smokeless tobacco. He reports that he does not drink alcohol  and does not use drugs.  Allergies  Allergen Reactions   Demerol [Meperidine] Nausea And Vomiting and Other (See Comments)    Made the patient violently sick   Zocor [Simvastatin] Nausea And Vomiting and Other (See Comments)    Made patient feel very jittery, also   Beet [Beta Vulgaris] Nausea And Vomiting   Liver Nausea And Vomiting    Any Liver    Family History  Problem Relation Age of Onset   Dementia Father     Prior to Admission medications   Medication Sig Start Date End Date Taking? Authorizing Provider  acetaminophen  (TYLENOL ) 500 MG tablet Take 1,000 mg by mouth every 6 (six) hours as needed for mild pain (pain score 1-3), moderate pain (pain score 4-6) or headache.    [provider]  albuterol  (PROVENTIL ) (2.5 MG/3ML) 0.083% nebulizer solution Take 2.5 mg by nebulization every 6 (six) hours as needed for wheezing or shortness of breath.    [provider]  albuterol  (VENTOLIN  HFA) 108 (90 Base) MCG/ACT inhaler Inhale 2 puffs into the lungs every 4 (four) hours as needed for wheezing or shortness of breath. 11/04/21   Cheryle Page, MD  aspirin  EC 81 MG tablet Take 81 mg by mouth daily. Swallow whole.    [provider]  azelastine  (ASTELIN ) 0.1 % nasal spray Place 1 spray into both nostrils 2 (two) times daily as needed for allergies or rhinitis (or post-nasal drip).    [provider]  bisacodyl  (DULCOLAX) 5 MG EC tablet Take 2 tablets (10 mg total) by mouth daily  as needed for moderate constipation. 01/26/23   Armenta Canning, MD  BLACK CURRANT SEED OIL PO Take 2 capsules by mouth in the morning.    [provider]  buPROPion  (WELLBUTRIN  XL) 300 MG 24 hr tablet Take 300 mg by mouth every morning. 02/05/20   [provider]  busPIRone  (BUSPAR ) 15 MG tablet Take 15 mg by mouth in the morning.    [provider]  Calcium  Carbonate (CALCIUM  500 PO) Take 500 mg by mouth in the morning.    [provider]  Carboxymethylcellulose Sod PF 0.5 % SOLN Place 1 drop into both eyes 4 (four) times daily as needed (dry eyes).    [provider]  cholecalciferol  (VITAMIN D3) 25 MCG (1000 UNIT) tablet Take 1,000 Units by mouth daily.    [provider]  Cyanocobalamin (VITAMIN B12 PO) Take 1 tablet by mouth in the morning.    [provider]  cyclobenzaprine  (FLEXERIL ) 10 MG tablet Take 20 mg by mouth at bedtime.    [provider]  cycloSPORINE  (RESTASIS ) 0.05 % ophthalmic emulsion Place 1 drop into both eyes every 12 (twelve) hours.    [provider]  diltiazem  (CARDIZEM  CD) 120 MG 24 hr capsule Take 1 capsule (120 mg total) by mouth daily. 05/25/23 08/23/23  Laurence Locus, DO  docusate sodium  (COLACE) 100 MG capsule Take 200 mg by mouth 2 (two) times daily.    [provider]  DULoxetine  (CYMBALTA ) 60 MG capsule Take 60 mg by mouth 2 (two) times daily.    [provider]  ferrous sulfate  325 (65 FE) MG tablet Take 325 mg by mouth daily with breakfast.    [provider]  fluticasone  (FLONASE ) 50 MCG/ACT nasal spray Place 2 sprays into both nostrils in the morning.    [provider]  folic acid  (FOLVITE ) 1 MG tablet Take 1 mg by mouth daily.    [provider]  gabapentin  (NEURONTIN ) 100 MG capsule Take 100 mg by mouth 3 (three) times daily.    [provider]  GARLIC OIL PO Take 1 capsule by mouth in the morning.    [provider]   Glucerna (GLUCERNA) LIQD Take 237 mLs by mouth See admin instructions. Glucerna Therapeutic Shake chocolate- Drink 1 bottle by mouth once a day    [provider]  HYDROcodone -acetaminophen  (NORCO) 10-325 MG tablet Take 1 tablet by mouth every 6 (six) hours as needed for moderate pain (pain score 4-6).    [provider]  KRILL OIL PO Take 2 capsules by mouth daily.    [provider]  lactulose  (CHRONULAC ) 10 GM/15ML solution Take 20 g by mouth 2 (two) times daily as needed for mild constipation.    [provider]  lidocaine  (XYLOCAINE ) 5 % ointment Apply 1 Application topically 2 (two) times daily as needed (for pain- affected sites).    [provider]  magnesium  citrate SOLN Take 0.5 Bottles by mouth daily as needed for severe constipation.    [provider]  Magnesium  Oxide (MAG-OXIDE PO) Take 1 tablet by mouth daily.    [provider]  Melatonin 3 MG CAPS Take 6 mg by mouth at bedtime.    [provider]  metFORMIN  (GLUCOPHAGE ) 850 MG tablet Take 850 mg by mouth daily after breakfast.    [provider]  Misc Natural Products (TURMERIC CURCUMIN) CAPS Take 3 capsules by mouth See admin instructions. Turmeric/Bioperine/Ginger/Garlic- Take 3 capsules by mouth once a day    [provider]  Mometasone  Furoate (ASMANEX  HFA) 200 MCG/ACT AERO Inhale 2 puffs into the lungs daily as needed (for severe COPD symptoms (when tolerated) and RINSE MOUTH VERY WELL WITH WATER AFTERWARDS).    [provider]  Multiple Vitamin (MULTIVITAMIN) tablet Take 1 tablet by mouth daily with breakfast.    [provider]  NON FORMULARY Place 1 application  into both nostrils See admin instructions. [employer]'s Original Nose Balm with Manuka Honey- Place into the affected nasal passage twice a day as needed for irritation    [provider]  NON FORMULARY Take 2-4 tablets by mouth See admin instructions.  ZzzQuil PURE Zzzs Melatonin Gummies- Chew 2 to 4 gummies by mouth at bedtime    [provider]  omeprazole  (PRILOSEC) 20 MG capsule Take 1 capsule (20 mg total) by mouth daily. Patient taking differently: Take 20 mg by mouth daily as needed (for reflux symptoms).  01/26/23   Armenta Canning, MD  ondansetron  (ZOFRAN -ODT) 4 MG disintegrating tablet Take 1 tablet (4 mg total) by mouth every 8 (eight) hours as needed for nausea or vomiting. Patient taking differently: Take 4 mg by mouth every 8 (eight) hours as needed for nausea or vomiting (dissolve orally). 05/23/23   Will Almarie MATSU, MD  Oxcarbazepine  (TRILEPTAL ) 300 MG tablet Take 600 mg by mouth in the morning, at noon, and at bedtime.    [provider]  OXYGEN  Inhale 6 L/min into the lungs continuous.    [provider]  polyethylene glycol powder (GLYCOLAX /MIRALAX ) 17 GM/SCOOP powder Take 17 g by mouth in the morning.    [provider]  psyllium (HYDROCIL/METAMUCIL) 95 % PACK Take 1 packet by mouth daily. Patient not taking: Reported on 06/10/2023 04/02/23   Gherghe, Costin M, MD  senna (SENOKOT) 8.6 MG TABS tablet Take 2 tablets (17.2 mg total) by mouth 2 (two) times daily. Patient taking differently: Take 1 tablet by mouth daily. 05/23/23   Will Almarie MATSU, MD  sodium chloride  (OCEAN) 0.65 % SOLN nasal spray Place 1 spray into both nostrils daily at 2 am. Patient taking differently: Place 1 spray into both nostrils as needed for congestion. 04/24/23   Will Almarie MATSU, MD  tamsulosin  (FLOMAX ) 0.4 MG CAPS capsule Take 0.8 mg by mouth in the morning.    [provider]  Tiotropium Bromide -Olodaterol 2.5-2.5 MCG/ACT AERS Inhale 2 puffs into the lungs every morning. 2 puffs    [provider]  traZODone  (DESYREL ) 50 MG tablet Take 75 mg by mouth at bedtime.    [provider]    Physical Exam: Vitals:   11/24/23 1430 11/24/23 1438 11/24/23 1445 11/24/23 1500  BP:       Pulse: (!) 102  (!) 103 (!) 103  Resp: (!) 21  (!) 23 (!) 21  Temp:      TempSrc:      SpO2: 95% 96% 96% 95%  Weight:      Height:       General:  Appears calm and comfortable and is in NAD, comfortable on HFNC O2, very conversant Eyes:   EOMI, normal lids, iris ENT:  grossly normal hearing, lips & tongue, mmm Cardiovascular:  RR with mild tachycardia. No LE edema.  Respiratory:   Poor air movement with some wheezing.  Mildly increased respiratory effort. 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 CLINICAL HISTORY: shortness of breath. Per chart - c/o SOB for the past 3-4 days. Per EMS, pts initial oxygen  saturation was 88% on his baseline 8L [place]. Pt was given one Duoneb treatment with some relief. On arrival, labored breathing noted with retractions and pt is unable to speak in full ; sentences with out taking a breath. Pt denies any recent sick contacts, chest pain, fevers, chills, n/v/d. Dr. Cottie at bedside and respiratory notified FINDINGS: LUNGS AND PLEURA: Stable multifocal opacities are present in the left mid/lower lung and right lower  lung. While this has previously been considered suspicious for pneumonia, given the chronic appearance, multifocal semi-invasive peripheral adenocarcinoma (formally, bronchoalveolar cell carcinoma) should be considered. Associated small bilateral pleural effusions, chronic. No pulmonary edema. No  pneumothorax. HEART AND MEDIASTINUM: No acute abnormality of the cardiac and mediastinal silhouettes. BONES AND SOFT TISSUES: No acute osseous abnormality. IMPRESSION: 1. Stable multifocal opacities in the left mid/lower lung and right lower lung. Given chronic appearance, multifocal semi-invasive peripheral adenocarcinoma should be considered. 2. Small bilateral pleural effusions, chronic. Electronically signed by: Pinkie Pebbles MD 11/23/2023 11:29 PM EDT RP Workstation: [id number]    EKG: Independently reviewed.  Sinus tachcyardia with rate 109; nonspecific ST changes with no evidence of acute ischemia   Labs on Admission: I have personally reviewed the available labs and imaging studies at the time of the admission.  Pertinent labs:    VBG: 7.45/48/33.4 Glucose 225 WBC 13.2 Procalcitonin <0.10 COVID/flu/RSV negative   Assessment and Plan: Principal Problem:   COPD exacerbation (HCC) Active Problems:   Non-small cell cancer of 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   Hypertension associated with diabetes (HCC)   DM2 (diabetes mellitus, type 2) (HCC)   Stage 3a chronic kidney disease (HCC)   Major depressive disorder, recurrent episode   HLA B27 (HLA B27 positive)   Class 2 obesity due to excess calories with body mass index (BMI) of 36.0 to 36.9 in adult   Ankylosing spondylitis lumbar region [facility])    Acute on chronic respiratory failure associated with a COPD exacerbation Patient's shortness of breath and productive cough are most likely caused by acute COPD exacerbation.  O2 sats as low as 86% Transient BIPAP -> HFNC O2 He has history of O2-dependent COPD and is generally wearing 8L at home He has been noncompliant with pulmonology f/u appointments because he gets too winded trying to walk to the [place] outside to take him there and so doesn't go She does not have fever and has mild leukocytosis Chest x-ray is not consistent with pneumonia but lists concern for  multifocal semi-invasive peripheral adenocarcinoma Admitted to stepdown Nebulizers: scheduled Duoneb and prn albuterol  Solu-Medrol   -> Prednisone   IV Azithromycin /Cefepime  for now, but may be able to narrow coverage Budesonide  nebs for Asmanex  Hold tiotropium-olodaterol Coordinated care with [facility] team/PT/OT/RT consults Pulmonology consulted He reports having had a palliative visit at home previously, likely a reasonable palliative vs. Hospice candidate  H/o Left lung CA S/p radiation Current CXR read with concern for multifocal semi-invasive peripheral adenocarcinoma Pulmonology consulted  Ankylosing spondylitis Resume home pain control regimen of gabapentin , cyclobenzaprine , Norco  Depression with prior suicide attempt Continue home bupropion , buspirone , duloxetine , oxcarbazepine , trazodone   HTN Resume diltiazem   BPH Continue tamsulosin   DM Recent A1c 6.7, good control Hold Glucophage  Cover with moderate-scale SSI Carb modified diet   OSA BIPAP at bedside for prn use  Stage 3a CKD Appears to be stable at this time Attempt to avoid nephrotoxic medications Recheck BMP in AM   Obesity Body mass index is 36.17 kg/m.SABRA  Weight loss should be encouraged Outpatient PCP/bariatric medicine f/u encouraged Significantly low or high BMI is associated with higher medical risk including morbidity and mortality   DNR I have discussed code status with the patient and he would not desire resuscitation and would prefer to die a natural death should that situation arise. [facility] documents reviewed Patient will need a gold out of facility DNR form at the time of discharge      Advance Care Planning:  Code Status: Limited: Do not attempt resuscitation (DNR) -DNR-LIMITED -Do Not Intubate/DNI    Consults: Pulmonology; PT/OT; TOC team; RT  DVT Prophylaxis: Lovenox   Family Communication: None present; he declined to have me call family at the time of admission  Severity of  Illness: The appropriate patient status for this patient is INPATIENT. Inpatient status is judged to be reasonable and necessary in order to provide the required intensity of service to ensure the patient's safety. The patient's presenting symptoms, physical exam findings, and initial radiographic and laboratory data in the context of their chronic comorbidities is felt to place them at high risk for further clinical deterioration. Furthermore, it is not anticipated that the patient will be medically stable for discharge from the hospital within 2 midnights of admission.   * I certify that at the point of admission it is my clinical judgment that the patient will require inpatient hospital care spanning beyond 2 midnights from the point of admission due to high intensity of service, high risk for further deterioration and high frequency of surveillance required.*  Author: Delon Herald, MD 11/24/2023 4:24 PM  For on call review [email or web address].

## 2023-11-24 NOTE — Plan of Care (Signed)
  Problem: Clinical Measurements: Goal: Respiratory complications will improve Outcome: Progressing Goal: Cardiovascular complication will be avoided Outcome: Progressing   Problem: Activity: Goal: Risk for activity intolerance will decrease Outcome: Progressing   Problem: Elimination: Goal: Will not experience complications related to urinary retention Outcome: Progressing   Problem: Safety: Goal: Ability to remain free from injury will improve Outcome: Not Progressing

## 2023-11-24 NOTE — ED Notes (Signed)
 .ED TO INPATIENT HANDOFF REPORT  Name/Age/Gender Mario [name] 73 y.o. male  Code Status    Code Status Orders  (From admission, onward)           Start     Ordered   11/24/23 0325  Full code  Continuous       Question:  By:  Answer:  Consent: discussion documented in EHR   11/24/23 0325           Code Status History     Date Active Date Inactive Code Status Order ID Comments User Context   05/18/2023 1549 05/25/2023 1601 Limited: Do not attempt resuscitation (DNR) -DNR-LIMITED -Do Not Intubate/DNI  [id number]  Marcellus Cassondra PARAS, NP ED   05/18/2023 0126 05/18/2023 1549 Full Code [id number]  Franky Redia SAILOR, MD ED   05/05/2023 1950 05/16/2023 1455 Full Code [id number]  Sim Emery CROME, MD Inpatient   04/28/2023 2257 05/05/2023 1610 Full Code [id number]  Tobie Jorie SAUNDERS, MD ED   04/01/2023 2033 04/24/2023 1635 Full Code [id number]  Tobie Jorie SAUNDERS, MD ED   03/17/2023 0419 04/01/2023 1712 Full Code [id number]  Lee Kingfisher, MD ED   10/20/2022 0511 10/21/2022 2108 Full Code [id number]  Laveda Roosevelt, MD ED   10/08/2022 0430 10/09/2022 1857 Full Code [id number]  Carita Senior, MD ED   09/25/2022 1202 10/01/2022 1702 Full Code [id number]  Celinda Alm Lot, MD ED   07/02/2022 1956 07/05/2022 1810 Full Code [id number]  Waddell Rake, MD ED   11/03/2021 0057 11/04/2021 1755 Full Code [id number]  Charlton Evalene RAMAN, MD ED   10/08/2021 1333 10/09/2021 1719 Full Code [id number]  Ruthell Lonni FALCON, PA-C ED   10/01/2021 1932 10/04/2021 1934 Full Code [id number]  Elnor Bernarda SQUIBB, DO ED   09/30/2021 2345 10/01/2021 1822 Full Code [id number]  Trine Raynell Moder, MD ED   09/23/2021 0029 09/26/2021 2237 DNR [id number]  Kenard Zachary PARAS, MD ED   08/25/2021 1020 09/07/2021 1653 DNR [id number]  Tina Tobey Jama SAILOR, NP Inpatient   08/24/2021 0402 08/25/2021 1020 Full Code [id number]  Charlton Evalene RAMAN, MD ED   08/21/2021 0031 08/23/2021 1845 Full Code [id number]  Charlton Evalene RAMAN, MD ED   08/07/2021 0956 08/11/2021 0427 Full Code [id number]   Milissa Tod PARAS, MD ED   08/03/2021 1851 08/06/2021 0348 Full Code [id number]  Juvenal Harlene PENNER, DO Inpatient   06/17/2021 0046 07/06/2021 1726 Full Code [id number]  Ricky Pax T, DO ED   06/04/2021 0139 06/11/2021 1609 Full Code [id number]  Howerter, Eva NOVAK, DO ED   05/21/2021 2320 05/25/2021 0549 Full Code [id number]  Rehman, Areeg N, DO ED   05/20/2021 0150 05/21/2021 2025 Full Code [id number]  Rehman, Areeg N, DO ED   02/04/2021 0105 02/04/2021 2154 Full Code [id number]  Tobie Jorie SAUNDERS, MD ED   01/09/2021 0612 01/10/2021 2012 Full Code [id number]  Alfornia Madison, MD Inpatient   09/04/2020 0923 09/05/2020 1724 Full Code [id number]  Rockey Denece LABOR, DO ED   07/26/2020 1449 08/05/2020 1913 Full Code [id number]  Rockey Denece LABOR, DO Inpatient   04/04/2020 0033 04/07/2020 2141 Full Code [id number]  Franky Redia SAILOR, MD ED   01/04/2020 1615 01/06/2020 1904 Full Code [id number]  Rockey Denece LABOR, DO Inpatient   12/26/2019 1039 12/28/2019 1810 Full Code [id number]  Barbarann Nest, MD ED   07/12/2019 0557 07/15/2019 1904 Full Code [id number]  Lorette Mayo, MD ED   07/03/2019 0016 07/03/2019 2011 Full Code [id number]  Celinda Alm Lot, MD ED   07/03/2019  0016 07/03/2019 0016 Full Code [id number]  Celinda Alm Lot, MD ED   06/23/2019 0425 06/25/2019 2122 Full Code [id number]  Fernand Re Z, DO ED   10/10/2018 2021 10/13/2018 1641 Full Code [id number]  Ward, Ami Copes, PA-C ED   04/30/2018 1759 05/01/2018 1714 Full Code [id number]  Leotis Sole, PA-C ED   02/20/2018 1759 02/21/2018 1452 Full Code [id number]  Theadore Ozell HERO, MD ED   07/16/2017 0309 07/21/2017 1734 Full Code [id number]  Lonzell Emeline HERO, DO ED   09/17/2016 2107 09/18/2016 2055 Full Code [id number]  Caleen Havens, MD ED   06/27/2016 1523 07/07/2016 1718 Full Code [id number]  Dwan Kyla HERO, PA-C ED   05/23/2014 2356 05/26/2014 1656 Full Code [id number]  Luke Agent, MD Inpatient   01/21/2013 2202 01/23/2013 2202 Full Code [id number]  Gabe Maude RAMAN, PA-C ED   01/12/2013 0020 01/12/2013  0641 Full Code [id number]  Arloa Chroman, PA-C ED   01/10/2013 0122 01/11/2013 2122 Full Code [id number]  Franky Redia SAILOR, MD Inpatient      Advance Directive Documentation    Flowsheet Row Most Recent Value  Type of Advance Directive Out of facility DNR (pink MOST or yellow form)  Pre-existing out of facility DNR order (yellow form or pink MOST form) --  MOST Form in Place? --    Home/SNF/Other Home  Chief Complaint COPD exacerbation (HCC) [J44.1]  Level of Care/Admitting Diagnosis ED Disposition     ED Disposition  Admit   Condition  --   Comment  Hospital [facility] [[id number]]  Level of Care: Stepdown [14]  Admit to SDU based on following criteria: Respiratory Distress:  Frequent assessment and/or intervention to maintain adequate ventilation/respiration, pulmonary toilet, and respiratory treatment.  May admit patient to [facility] or [facility] if equivalent level of care is available:: No  Covid Evaluation: Asymptomatic - no recent exposure (last 10 days) testing not required  Diagnosis: COPD exacerbation [facility]) [[id number]]  Admitting Physician: DEBBY CAMILA LABOR [[id number]]  Attending Physician: DEBBY CAMILA LABOR [[id number]]  Certification:: I certify this patient will need inpatient services for at least 2 midnights  Expected Medical Readiness: 11/28/2023          Medical History Past Medical History:  Diagnosis Date   Ankylosing spondylitis lumbar region [facility]) 09/25/2022   Anxiety    Bronchitis    COPD (chronic obstructive pulmonary disease) (HCC)    Depression    History of falling 07/29/2021   history of Pulmonary embolism (HCC) 07/02/2022   History of radiation therapy    Left lung- 10/05/20-10/15/20- Dr. Agent Nasuti   Hypertension    lung ca 09/2020   MI (myocardial infarction) [facility])    ????   On home oxygen  therapy    4L/min [place]   OSA (obstructive sleep apnea)    Suicidal ideation 06/23/2019   Suicide attempt (HCC)     Tension pneumothorax 06/27/2016   Uveitis     Allergies Allergies  Allergen Reactions   Demerol [Meperidine] Nausea And Vomiting and Other (See Comments)    Made the patient violently sick   Zocor [Simvastatin] Nausea And Vomiting and Other (See Comments)    Made patient feel very jittery, also   Beet [Beta Vulgaris] Nausea And Vomiting   Liver Nausea And Vomiting    Any Liver    IV Location/Drains/Wounds Patient Lines/Drains/Airways Status     Active Line/Drains/Airways     Name Placement date Placement time Site Days   Peripheral IV 11/23/23 20 G  Right Antecubital 11/23/23  2318  Antecubital  1            Labs/Imaging Results for orders placed or performed during the hospital encounter of 11/23/23 (from the past 48 hours)  Resp panel by RT-PCR (RSV, Flu A&B, Covid) Anterior Nasal Swab     Status: None   Collection Time: 11/23/23 11:06 PM   Specimen: Anterior Nasal Swab  Result Value Ref Range   SARS Coronavirus 2 by RT PCR NEGATIVE NEGATIVE    Comment: (NOTE) SARS-CoV-2 target nucleic acids are NOT DETECTED.  The SARS-CoV-2 RNA is generally detectable in upper respiratory specimens during the acute phase of infection. The lowest concentration of SARS-CoV-2 viral copies this assay can detect is 138 copies/mL. A negative result does not preclude SARS-Cov-2 infection and should not be used as the sole basis for treatment or other patient management decisions. A negative result may occur with  improper specimen collection/handling, submission of specimen other than nasopharyngeal swab, presence of viral mutation(s) within the areas targeted by this assay, and inadequate number of viral copies(<138 copies/mL). A negative result must be combined with clinical observations, patient history, and epidemiological information. The expected result is Negative.  Fact Sheet for Patients:  BloggerCourse.com  Fact Sheet for Healthcare Providers:   SeriousBroker.it  This test is no t yet approved or cleared by the [place]  FDA and  has been authorized for detection and/or diagnosis of SARS-CoV-2 by FDA under an Emergency Use Authorization (EUA). This EUA will remain  in effect (meaning this test can be used) for the duration of the COVID-19 declaration under Section 564(b)(1) of the Act, 21 U.S.C.section 360bbb-3(b)(1), unless the authorization is terminated  or revoked sooner.       Influenza A by PCR NEGATIVE NEGATIVE   Influenza B by PCR NEGATIVE NEGATIVE    Comment: (NOTE) The Xpert Xpress SARS-CoV-2/FLU/RSV plus assay is intended as an aid in the diagnosis of influenza from Nasopharyngeal swab specimens and should not be used as a sole basis for treatment. Nasal washings and aspirates are unacceptable for Xpert Xpress SARS-CoV-2/FLU/RSV testing.  Fact Sheet for Patients: BloggerCourse.com  Fact Sheet for Healthcare Providers: SeriousBroker.it  This test is not yet approved or cleared by the [place]  FDA and has been authorized for detection and/or diagnosis of SARS-CoV-2 by FDA under an Emergency Use Authorization (EUA). This EUA will remain in effect (meaning this test can be used) for the duration of the COVID-19 declaration under Section 564(b)(1) of the Act, 21 U.S.C. section 360bbb-3(b)(1), unless the authorization is terminated or revoked.     Resp Syncytial Virus by PCR NEGATIVE NEGATIVE    Comment: (NOTE) Fact Sheet for Patients: BloggerCourse.com  Fact Sheet for Healthcare Providers: SeriousBroker.it  This test is not yet approved or cleared by the [place]  FDA and has been authorized for detection and/or diagnosis of SARS-CoV-2 by FDA under an Emergency Use Authorization (EUA). This EUA will remain in effect (meaning this test can be used) for the duration of  the COVID-19 declaration under Section 564(b)(1) of the Act, 21 U.S.C. section 360bbb-3(b)(1), unless the authorization is terminated or revoked.  Performed at [facility], [address], [place], [place] [address]   Basic metabolic panel     Status: Abnormal   Collection Time: 11/23/23 11:15 PM  Result Value Ref Range   Sodium 138 135 - 145 mmol/L   Potassium 4.3 3.5 - 5.1 mmol/L   Chloride 100 98 - 111 mmol/L  CO2 25 22 - 32 mmol/L   Glucose, Bld 147 (H) 70 - 99 mg/dL    Comment: Glucose reference range applies only to samples taken after fasting for at least 8 hours.   BUN 12 8 - 23 mg/dL   Creatinine, Ser 8.98 0.61 - 1.24 mg/dL   Calcium  9.3 8.9 - 10.3 mg/dL   GFR, Estimated >39 >39 mL/min    Comment: (NOTE) Calculated using the CKD-EPI Creatinine Equation (2021)    Anion gap 13 5 - 15    Comment: Performed at [facility], [address], [place], [place] [address]  CBC with Differential/Platelet     Status: Abnormal   Collection Time: 11/23/23 11:15 PM  Result Value Ref Range   WBC 12.2 (H) 4.0 - 10.5 K/uL   RBC 4.61 4.22 - 5.81 MIL/uL   Hemoglobin 13.6 13.0 - 17.0 g/dL   HCT 57.5 60.9 - 47.9 %   MCV 92.0 80.0 - 100.0 fL   MCH 29.5 26.0 - 34.0 pg   MCHC 32.1 30.0 - 36.0 g/dL   RDW 85.0 88.4 - 84.4 %   Platelets 414 (H) 150 - 400 K/uL   nRBC 0.0 0.0 - 0.2 %   Neutrophils Relative % 73 %   Neutro Abs 8.9 (H) 1.7 - 7.7 K/uL   Lymphocytes Relative 16 %   Lymphs Abs 1.9 0.7 - 4.0 K/uL   Monocytes Relative 9 %   Monocytes Absolute 1.1 (H) 0.1 - 1.0 K/uL   Eosinophils Relative 2 %   Eosinophils Absolute 0.3 0.0 - 0.5 K/uL   Basophils Relative 0 %   Basophils Absolute 0.1 0.0 - 0.1 K/uL   Immature Granulocytes 0 %   Abs Immature Granulocytes 0.05 0.00 - 0.07 K/uL    Comment: Performed at [facility], [address], [place], [place] [address]  Blood gas, venous     Status: Abnormal   Collection Time:  11/23/23 11:15 PM  Result Value Ref Range   pH, Ven 7.45 (H) 7.25 - 7.43   pCO2, Ven 48 44 - 60 mmHg   pO2, Ven 33 32 - 45 mmHg   Bicarbonate 33.4 (H) 20.0 - 28.0 mmol/L   Acid-Base Excess 8.1 (H) 0.0 - 2.0 mmol/L   O2 Saturation 58.1 %   Patient temperature 37.0     Comment: Performed at [facility], [address], [place], [place] [address]  Pro Brain natriuretic peptide     Status: None   Collection Time: 11/23/23 11:15 PM  Result Value Ref Range   Pro Brain Natriuretic Peptide 85.2 <300.0 pg/mL    Comment: (NOTE) Age Group        Cut-Points    Interpretation  < 50 years     450 pg/mL       NT-proBNP > 450 pg/mL indicates                                ADHF is likely              50 to 75 years  900 pg/mL      NT-proBNP > 900 pg/mL indicates          ADHF is likely  > 75 years      1800 pg/mL     NT-proBNP > 1800 pg/mL indicates          ADHF is likely  All ages    Results between       Indeterminate. Further clinical             300 and the cut-   information is needed to determine            point for age group   if ADHF is present.                                                             Elecsys proBNP II/ Elecsys proBNP II STAT           Cut-Point                       Interpretation  300 pg/mL                    NT-proBNP <300pg/mL indicates                             ADHF is not likely  Performed at [facility], [address] [place], [place] [address]   Procalcitonin     Status: None   Collection Time: 11/23/23 11:15 PM  Result Value Ref Range   Procalcitonin <0.10 ng/mL    Comment: (NOTE)   Sepsis PCT Algorithm          Lower Respiratory Tract Infection                                         PCT Algorithm -----------------------------------------------------------------  <0.5 ng/mL                    <0.10 ng/mL  Associated with low           Antibiotic therapy strongly   risk for  progression          discouraged. Indicates absence   to severe sepsis              of bacteria infection  and/or septic shock             --------------------------------------------------------------  0.5-2.0 ng/mL                 0.10-0.25 ng/mL  Recommended to retest         Antibiotic therapy discouraged.  PCT within 6-24 hours         Bacterial infection unlikely  ------------------------------------------------------------  >2 ng/mL                      0.26-0.50 ng/mL  Associated with high risk     Antibiotic therapy encouraged.  for progression to severe     Bacterial infection possible  sepsis/and or septic shock    ------------------------------                                 >0.50 ng/mL                                Antibiotic  therapy strongly                                 encouraged.                                Suggestive of presence of                                 bacterial infection.                                 -------------------------------------------------------------------  < or = 0.50 ng/mL OR          < or = 0.25 OR 80% decrease in PCT  80% decrease in PCT           Antibiotic therapy   Antibiotic therapy may        may be discontinued  be discontinued                                 Performed at [facility], [address] [place], [place] [address]    DG Chest Port 1 View Result Date: 11/23/2023 EXAM: 1 VIEW(S) XRAY OF THE CHEST 11/23/2023 11:24:06 PM COMPARISON: CTA chest dated 07/20/2023. CLINICAL HISTORY: shortness of breath. Per chart - c/o SOB for the past 3-4 days. Per EMS, pts initial oxygen  saturation was 88% on his baseline 8L [place]. Pt was given one Duoneb treatment with some relief. On arrival, labored breathing noted with retractions and pt is unable to speak in full ; sentences with out taking a breath. Pt denies any recent sick contacts, chest pain, fevers, chills, n/v/d. Dr. Cottie at bedside and respiratory notified  FINDINGS: LUNGS AND PLEURA: Stable multifocal opacities are present in the left mid/lower lung and right lower lung. While this has previously been considered suspicious for pneumonia, given the chronic appearance, multifocal semi-invasive peripheral adenocarcinoma (formally, bronchoalveolar cell carcinoma) should be considered. Associated small bilateral pleural effusions, chronic. No pulmonary edema. No pneumothorax. HEART AND MEDIASTINUM: No acute abnormality of the cardiac and mediastinal silhouettes. BONES AND SOFT TISSUES: No acute osseous abnormality. IMPRESSION: 1. Stable multifocal opacities in the left mid/lower lung and right lower lung. Given chronic appearance, multifocal semi-invasive peripheral adenocarcinoma should be considered. 2. Small bilateral pleural effusions, chronic. Electronically signed by: Pinkie Pebbles MD 11/23/2023 11:29 PM EDT RP Workstation: [id number]    Pending Labs Unresulted Labs (From admission, onward)     Start     Ordered   11/24/23 0500  Comprehensive metabolic panel  Tomorrow morning,   R        11/24/23 0325   11/24/23 0500  CBC  Tomorrow morning,   R        11/24/23 0325   11/24/23 0323  Respiratory (~20 pathogens) panel by PCR  (COPD / Pneumonia / Cellulitis / Lower Extremity Wound (Diabetic Foot Infection))  Once,   R        11/24/23 0325   11/24/23 0323  Expectorated Sputum Assessment w Gram Stain, Rflx to Resp Cult  (COPD / Pneumonia / Cellulitis / Lower Extremity Wound (Diabetic Foot Infection))  Once,   R  11/24/23 0325            Vitals/Pain Today's Vitals   11/24/23 0145 11/24/23 0200 11/24/23 0215 11/24/23 0300  BP: 137/63 130/75 (!) 141/85 (!) 147/81  Pulse: (!) 102 (!) 103 (!) 103 (!) 102  Resp:   (!) 22 (!) 24  Temp:   98.2 F (36.8 C)   TempSrc:   Oral   SpO2: 94% 94% 95% 94%  Weight:      Height:      PainSc:   6      Isolation Precautions Droplet precaution  Medications Medications  methylPREDNISolone  sodium  succinate (SOLU-MEDROL ) 40 mg/mL injection 40 mg (has no administration in time range)    Followed by  predniSONE  (DELTASONE ) tablet 40 mg (has no administration in time range)  ipratropium-albuterol  (DUONEB) 0.5-2.5 (3) MG/3ML nebulizer solution 3 mL (has no administration in time range)  levalbuterol  (XOPENEX ) nebulizer solution 0.63 mg (has no administration in time range)  acetaminophen  (TYLENOL ) tablet 650 mg (has no administration in time range)    Or  acetaminophen  (TYLENOL ) suppository 650 mg (has no administration in time range)  morphine  (PF) 2 MG/ML injection 2 mg (has no administration in time range)  ondansetron  (ZOFRAN ) tablet 4 mg (has no administration in time range)    Or  ondansetron  (ZOFRAN ) injection 4 mg (has no administration in time range)  methylPREDNISolone  sodium succinate (SOLU-MEDROL ) 125 mg/2 mL injection 125 mg (125 mg Intravenous Given 11/23/23 2324)  ipratropium-albuterol  (DUONEB) 0.5-2.5 (3) MG/3ML nebulizer solution 3 mL (3 mLs Nebulization Given 11/23/23 2324)  magnesium  sulfate IVPB 2 g 50 mL (0 g Intravenous Stopped 11/24/23 0028)  ipratropium-albuterol  (DUONEB) 0.5-2.5 (3) MG/3ML nebulizer solution 3 mL (3 mLs Nebulization Given 11/24/23 0207)    Mobility Bedrest exertional hypoxia noted

## 2023-11-24 NOTE — Progress Notes (Addendum)
 MD requested to wean pt off bipap. Pt placed on an 8L [place] and is doing well at this time and states his breathing is better. RT made pt aware that if he feels SOB to let us  know.

## 2023-11-24 NOTE — Progress Notes (Signed)
 Initial Nutrition Assessment  DOCUMENTATION CODES:   Obesity unspecified  INTERVENTION:   -Ensure Plus High Protein po BID, each supplement provides 350 kcal and 20 grams of protein   -Multivitamin with minerals daily  NUTRITION DIAGNOSIS:   Increased nutrient needs related to chronic illness as evidenced by estimated needs.  GOAL:   Patient will meet greater than or equal to 90% of their needs  MONITOR:   PO intake, Supplement acceptance  REASON FOR ASSESSMENT:   Consult Assessment of nutrition requirement/status  ASSESSMENT:   73 y.o. male with a past medical history listed below including chronic COPD on 4 L nasal cannula at rest, 8 L nasal cannula with exertion at home.  Reports that he has been having increased shortness of breath and dyspnea on exertion over the past several days with productive cough noted at time.  Patient in room, trying to sleep. Pt reports no changes in appetite or eating d/t SOB and cough. Pt does drink Ensure at home, will order here given increased needs associated with COPD.   Per weight records, pt has lost 18 lbs since March 2025 (6% wt loss x 6 months, insignificant for time frame).  Medications: Colace, Prednisone   Labs reviewed.  NUTRITION - FOCUSED PHYSICAL EXAM:  No depletions noted  Diet Order:   Diet Order             Diet regular Room service appropriate? Yes; Fluid consistency: Thin  Diet effective now                   EDUCATION NEEDS:   Education needs have been addressed  Skin:  Skin Assessment: Reviewed RN Assessment  Last BM:  9/25  Height:   Ht Readings from Last 1 Encounters:  11/24/23 6' 2 (1.88 m)    Weight:   Wt Readings from Last 1 Encounters:  11/24/23 127.8 kg    BMI:  Body mass index is 36.17 kg/m.  Estimated Nutritional Needs:   Kcal:  7649-7449  Protein:  115-130g  Fluid:  2.3L/day  Morna Lee, MS, RD, LDN Inpatient Clinical Dietitian Contact via Secure chat

## 2023-11-24 NOTE — Consult Note (Signed)
 NAME:  Mario [name], MRN:  [id number], DOB:  01/01/1951, LOS: 0 ADMISSION DATE:  11/23/2023, CONSULTATION DATE:  11/24/2023 REFERRING MD:  [facility], CHIEF COMPLAINT:  Dyspnea   History of Present Illness:  Mario [name] is a 73 yo male w/ a PMHx ankylosing spondylitis, COPD on 6-8L home O2, former smoker having quit in 2018, depression with prior suicide attempt, PE, left lung CA s/p radiation, HTN, and OSA who presented on 9/25 for SOB. Patient reports worsening dyspnea, increasing O2 requirements, and cough over the past 3 days. Patient sees pulmonologist at the [facility], however has missed several appointments. Patient was admitted to [facility] and started on bronchopulmonary therapies and antibiotics. Infectious pulmonary workup negative. CXR demonstrates stable bilateral multifocal opacities and chronic small bilateral effusions. Empirically started on Zithromax  and Cefepime  for COPD exacerbation. PCCM consulted for evaluation.  Patient seen at bedside in no acute distress on MFNC. Patient endorses dyspnea, similar to previous COPD exacerbation events. Denies systemic symptoms including fever, chills, diarrhea, emesis. Clinically volume overloaded with generalized, nonpitting edema and coarse breath sounds noted in bilateral lung bases. Plan to diurese with Lasix  40mg  with goal net negative 1-2L and optimize COPD nebs.  All questions addressed at time of visit.  Pertinent  Medical History  See above  Significant Hospital Events: Including procedures, antibiotic start and stop dates in addition to other pertinent events   9/26: started on Zithromax  x3 days and Cefepime  x 7 days for COPD exacerbation  Objective    Blood pressure (!) 152/67, pulse (!) 103, temperature 97.7 F (36.5 C), temperature source Oral, resp. rate (!) 21, height 6' 2 (1.88 m), weight 127.8 kg, SpO2 95%.    FiO2 (%):  [50 %] 50 %   Intake/Output Summary (Last 24 hours) at 11/24/2023 1808 Last data filed at 11/24/2023  1625 Gross per 24 hour  Intake 944.21 ml  Output 1200 ml  Net -255.79 ml   Filed Weights   11/23/23 2308 11/24/23 0445  Weight: 127 kg 127.8 kg   Examination: General: NAD, lying in bed, cooperative with exam HENT: atraumatic, normocephalic, PERRL, mmm Lungs: coarse BS bilateral lung bases Cardiovascular: ST,  Abdomen: soft, rounded, non distended, non tender to palpation Extremities: normal strength, mild nonpitting edema, hyperpigmentation and stasis dermatitis to bilateral lower extremities c/w venous insufficiency Neuro: A&Ox4, no focal deficits GU: Voids  Assessment and Plan   Acute on chronic respiratory failure in setting of COPD Exacerbation on 6-8L O2 baseline Hx of left lung cancer s/p radiation - Initially placed on BiPAP>>oxygenating well on HFNC, continue to wean to maintain SpO2 >88% - CXR on admission 9/25 w/ stable bilateral multifocal opacities and chronic small bilateral effusions.  - ICS/LABA/LAMA - Duonebs PRN - Guaifenesin  PRN - Lasix  40mg  once with goal uop net negative 1-2L - Encourage hourly IS - OOB as tolerated - Steroid taper - Started on Zithromax  x 3 days, and Cefepime  x 7 days  Hx venous insufficiency HTN -Continue home Cardizem  -Hydralazine  PRN  Anxiety/Depression w/ prior SA Sleep disturbances -Continue home Wellbutrin /Buspar /Duloxetine /Trazodone   Ankylosing-spondylitis -Continue home MMPR including Gabapentin , cyclobenzaprine , Norco  CKD stage 3 BPH -sCr 1; baseline 0.8-1.1 -Continue home tamsulosin  -Monitor uop with goal 0.87ml/kg/hr -Monitor renal function on BMP as indicated  Hx DM-well controlled Steroid/stress-induced hyperglycemia - Hold home metformin  - A1c 6.7 - SSI - Steroid taper  DVT ppx: Lvx   Labs   CBC: Recent Labs  Lab 11/23/23 2315 11/24/23 0506  WBC 12.2* 13.2*  NEUTROABS 8.9*  --  HGB 13.6 13.6  HCT 42.4 43.2  MCV 92.0 91.5  PLT 414* 397    Basic Metabolic Panel: Recent Labs  Lab  11/23/23 2315 11/24/23 0506  NA 138 135  K 4.3 4.6  CL 100 100  CO2 25 22  GLUCOSE 147* 225*  BUN 12 16  CREATININE 1.01 1.02  CALCIUM  9.3 9.3   GFR: Estimated Creatinine Clearance: 94.3 mL/min (by C-G formula based on SCr of 1.02 mg/dL). Recent Labs  Lab 11/23/23 2315 11/24/23 0506  PROCALCITON <0.10  --   WBC 12.2* 13.2*    Liver Function Tests: Recent Labs  Lab 11/24/23 0506  AST 21  ALT 12  ALKPHOS 113  BILITOT 0.2  PROT 7.1  ALBUMIN 3.9   No results for input(s): LIPASE, AMYLASE in the last 168 hours. No results for input(s): AMMONIA in the last 168 hours.  ABG    Component Value Date/Time   PHART 7.432 05/25/2023 2350   PCO2ART 42.7 05/25/2023 2350   PO2ART 73 (L) 05/25/2023 2350   HCO3 33.4 (H) 11/23/2023 2315   TCO2 27 07/19/2023 2015   ACIDBASEDEF 3.3 (H) 06/04/2021 0810   O2SAT 58.1 11/23/2023 2315     Coagulation Profile: No results for input(s): INR, PROTIME in the last 168 hours.  Cardiac Enzymes: No results for input(s): CKTOTAL, CKMB, CKMBINDEX, TROPONINI in the last 168 hours.  HbA1C: Hgb A1c MFr Bld  Date/Time Value Ref Range Status  03/18/2023 03:23 AM 6.7 (H) 4.8 - 5.6 % Final    Comment:    (NOTE) Pre diabetes:          5.7%-6.4%  Diabetes:              >6.4%  Glycemic control for   <7.0% adults with diabetes   09/25/2022 02:31 PM 6.7 (H) 4.8 - 5.6 % Final    Comment:    (NOTE)         Prediabetes: 5.7 - 6.4         Diabetes: >6.4         Glycemic control for adults with diabetes: <7.0     CBG: Recent Labs  Lab 11/24/23 1803  GLUCAP 207*    Review of Systems:   Review of Systems  Constitutional: Negative.   HENT:  Positive for congestion and ear pain.   Eyes: Negative.   Respiratory:  Positive for cough and shortness of breath.   Cardiovascular: Negative.   Gastrointestinal: Negative.   Genitourinary: Negative.   Musculoskeletal: Negative.   Skin: Negative.   Neurological: Negative.    Endo/Heme/Allergies: Negative.   Psychiatric/Behavioral: Negative.     Past Medical History:  He,  has a past medical history of Ankylosing spondylitis lumbar region [facility]) (09/25/2022), Anxiety, Bronchitis, COPD (chronic obstructive pulmonary disease) (HCC), Depression, History of falling (07/29/2021), history of Pulmonary embolism (HCC) (07/02/2022), History of radiation therapy, Hypertension, lung ca (09/2020), MI (myocardial infarction) (HCC), On home oxygen  therapy, OSA (obstructive sleep apnea), Suicidal ideation (06/23/2019), Suicide attempt (HCC), Tension pneumothorax (06/27/2016), and Uveitis.   Surgical History:   Past Surgical History:  Procedure Laterality Date   BIOPSY  07/03/2021   Procedure: BIOPSY;  Surgeon: Elicia Claw, MD;  Location: [facility] ENDOSCOPY;  Service: Gastroenterology;;   BRONCHIAL BIOPSY  07/30/2020   Procedure: BRONCHIAL BIOPSIES;  Surgeon: Brenna Adine CROME, DO;  Location: [facility];  Service: Pulmonary;;   BRONCHIAL BRUSHINGS  07/30/2020   Procedure: BRONCHIAL BRUSHINGS;  Surgeon: Brenna Adine CROME, DO;  Location: [facility];  Service: Pulmonary;;   BRONCHIAL NEEDLE ASPIRATION BIOPSY  07/30/2020   Procedure: BRONCHIAL NEEDLE ASPIRATION BIOPSIES;  Surgeon: Brenna Adine CROME, DO;  Location: [facility];  Service: Pulmonary;;   BRONCHIAL WASHINGS  07/30/2020   Procedure: BRONCHIAL WASHINGS;  Surgeon: Brenna Adine CROME, DO;  Location: [facility];  Service: Pulmonary;;   BRONCHIAL WASHINGS  04/17/2023   Procedure: BRONCHIAL WASHINGS;  Surgeon: Claudene Toribio BROCKS, MD;  Location[facility] ENDOSCOPY;  Service: Pulmonary;;   CHEST TUBE INSERTION Left 06/27/2016   CRYOTHERAPY  04/17/2023   Procedure: CRYOTHERAPY;  Surgeon: Claudene Toribio BROCKS, MD;  Location: [facility] ENDOSCOPY;  Service: Pulmonary;;   cryptorchidism     ESOPHAGOGASTRODUODENOSCOPY N/A 07/03/2021   Procedure: ESOPHAGOGASTRODUODENOSCOPY (EGD);  Surgeon: Elicia Claw, MD;  Location: [facility];  Service: Gastroenterology;   Laterality: N/A;   HEMOSTASIS CONTROL  04/17/2023   Procedure: HEMOSTASIS CONTROL;  Surgeon: Claudene Toribio BROCKS, MD;  Location: [facility] ENDOSCOPY;  Service: Pulmonary;;   IR PERC PLEURAL DRAIN W/INDWELL CATH W/IMG GUIDE  08/04/2021   IR REMOVAL OF PLURAL CATH W/CUFF  09/01/2021   SKIN CANCER EXCISION     VIDEO BRONCHOSCOPY Left 04/17/2023   Procedure: VIDEO BRONCHOSCOPY WITHOUT FLUORO;  Surgeon: Claudene Toribio BROCKS, MD;  Location: [facility] ENDOSCOPY;  Service: Pulmonary;  Laterality: Left;   VIDEO BRONCHOSCOPY WITH ENDOBRONCHIAL NAVIGATION Left 07/30/2020   Procedure: VIDEO BRONCHOSCOPY WITH ENDOBRONCHIAL NAVIGATION;  Surgeon: Brenna Adine CROME, DO;  Location: [facility] ENDOSCOPY;  Service: Pulmonary;  Laterality: Left;     Social History:   reports that he quit smoking about 7 years ago. His smoking use included cigarettes. He started smoking about 42 years ago. He has a 35 pack-year smoking history. He has been exposed to tobacco smoke. He has never used smokeless tobacco. He reports that he does not drink alcohol  and does not use drugs.   Family History:  His family history includes Dementia in his father.   Allergies Allergies  Allergen Reactions   Demerol [Meperidine] Nausea And Vomiting and Other (See Comments)    Made the patient violently sick   Zocor [Simvastatin] Nausea And Vomiting and Other (See Comments)    Made patient feel very jittery, also   Beet [Beta Vulgaris] Nausea And Vomiting   Liver Nausea And Vomiting    Any Liver     Home Medications  Prior to Admission medications   Medication Sig Start Date End Date Taking? Authorizing Provider  acetaminophen  (TYLENOL ) 500 MG tablet Take 1,000 mg by mouth every 6 (six) hours as needed for mild pain (pain score 1-3), moderate pain (pain score 4-6) or headache.    [provider]  albuterol  (PROVENTIL ) (2.5 MG/3ML) 0.083% nebulizer solution Take 2.5 mg by nebulization every 6 (six) hours as needed for wheezing or shortness of breath.    [provider]  albuterol  (VENTOLIN  HFA) 108 (90 Base) MCG/ACT inhaler Inhale 2 puffs into the lungs every 4 (four) hours as needed for wheezing or shortness of breath. 11/04/21   Cheryle Page, MD  aspirin  EC 81 MG tablet Take 81 mg by mouth daily. Swallow whole.    [provider]  azelastine  (ASTELIN ) 0.1 % nasal spray Place 1 spray into both nostrils 2 (two) times daily as needed for allergies or rhinitis (or post-nasal drip).    [provider]  bisacodyl  (DULCOLAX) 5 MG EC tablet Take 2 tablets (10 mg total) by mouth daily as needed for moderate constipation. 01/26/23   Armenta Canning, MD  BLACK CURRANT SEED OIL  PO Take 2 capsules by mouth in the morning.    [provider]  buPROPion  (WELLBUTRIN  XL) 300 MG 24 hr tablet Take 300 mg by mouth every morning. 02/05/20   [provider]  busPIRone  (BUSPAR ) 15 MG tablet Take 15 mg by mouth in the morning.    [provider]  Calcium  Carbonate (CALCIUM  500 PO) Take 500 mg by mouth in the morning.    [provider]  Carboxymethylcellulose Sod PF 0.5 % SOLN Place 1 drop into both eyes 4 (four) times daily as needed (dry eyes).    [provider]  cholecalciferol  (VITAMIN D3) 25 MCG (1000 UNIT) tablet Take 1,000 Units by mouth daily.    [provider]  Cyanocobalamin (VITAMIN B12 PO) Take 1 tablet by mouth in the morning.    [provider]  cyclobenzaprine  (FLEXERIL ) 10 MG tablet Take 20 mg by mouth at bedtime.    [provider]  cycloSPORINE  (RESTASIS ) 0.05 % ophthalmic emulsion Place 1 drop into both eyes every 12 (twelve) hours.    [provider]  diltiazem  (CARDIZEM  CD) 120 MG 24 hr capsule Take 1 capsule (120 mg total) by mouth daily. 05/25/23 08/23/23  Laurence Locus, DO  docusate sodium  (COLACE) 100 MG capsule Take 200 mg by mouth 2 (two) times daily.    [provider]  DULoxetine  (CYMBALTA ) 60 MG capsule Take 60 mg by mouth 2 (two) times  daily.    [provider]  ferrous sulfate  325 (65 FE) MG tablet Take 325 mg by mouth daily with breakfast.    [provider]  fluticasone  (FLONASE ) 50 MCG/ACT nasal spray Place 2 sprays into both nostrils in the morning.    [provider]  folic acid  (FOLVITE ) 1 MG tablet Take 1 mg by mouth daily.    [provider]  gabapentin  (NEURONTIN ) 100 MG capsule Take 100 mg by mouth 3 (three) times daily.    [provider]  GARLIC OIL PO Take 1 capsule by mouth in the morning.    [provider]  Glucerna (GLUCERNA) LIQD Take 237 mLs by mouth See admin instructions. Glucerna Therapeutic Shake chocolate- Drink 1 bottle by mouth once a day    [provider]  HYDROcodone -acetaminophen  (NORCO) 10-325 MG tablet Take 1 tablet by mouth every 6 (six) hours as needed for moderate pain (pain score 4-6).    [provider]  KRILL OIL PO Take 2 capsules by mouth daily.    [provider]  lactulose  (CHRONULAC ) 10 GM/15ML solution Take 20 g by mouth 2 (two) times daily as needed for mild constipation.    [provider]  lidocaine  (XYLOCAINE ) 5 % ointment Apply 1 Application topically 2 (two) times daily as needed (for pain- affected sites).    [provider]  magnesium  citrate SOLN Take 0.5 Bottles by mouth daily as needed for severe constipation.    [provider]  Magnesium  Oxide (MAG-OXIDE PO) Take 1 tablet by mouth daily.    [provider]  Melatonin 3 MG CAPS Take 6 mg by mouth at bedtime.    [provider]  metFORMIN  (GLUCOPHAGE ) 850 MG tablet Take 850 mg by mouth daily after breakfast.    [provider]  Misc Natural Products (TURMERIC CURCUMIN) CAPS Take 3 capsules by mouth See admin instructions. Turmeric/Bioperine/Ginger/Garlic- Take 3 capsules by mouth once a day    [provider]  Mometasone  Furoate (ASMANEX  HFA) 200 MCG/ACT AERO Inhale 2 puffs  into the  lungs daily as needed (for severe COPD symptoms (when tolerated) and RINSE MOUTH VERY WELL WITH WATER AFTERWARDS).    [provider]  Multiple Vitamin (MULTIVITAMIN) tablet Take 1 tablet by mouth daily with breakfast.    [provider]  NON FORMULARY Place 1 application  into both nostrils See admin instructions. InstaCure's Original Nose Balm with Manuka Honey- Place into the affected nasal passage twice a day as needed for irritation    [provider]  NON FORMULARY Take 2-4 tablets by mouth See admin instructions. ZzzQuil PURE Zzzs Melatonin Gummies- Chew 2 to 4 gummies by mouth at bedtime    [provider]  omeprazole  (PRILOSEC) 20 MG capsule Take 1 capsule (20 mg total) by mouth daily. Patient taking differently: Take 20 mg by mouth daily as needed (for reflux symptoms). 01/26/23   Armenta Canning, MD  ondansetron  (ZOFRAN -ODT) 4 MG disintegrating tablet Take 1 tablet (4 mg total) by mouth every 8 (eight) hours as needed for nausea or vomiting. Patient taking differently: Take 4 mg by mouth every 8 (eight) hours as needed for nausea or vomiting (dissolve orally). 05/23/23   Will Almarie MATSU, MD  Oxcarbazepine  (TRILEPTAL ) 300 MG tablet Take 600 mg by mouth in the morning, at noon, and at bedtime.    [provider]  OXYGEN  Inhale 6 L/min into the lungs continuous.    [provider]  polyethylene glycol powder (GLYCOLAX /MIRALAX ) 17 GM/SCOOP powder Take 17 g by mouth in the morning.    [provider]  senna (SENOKOT) 8.6 MG TABS tablet Take 2 tablets (17.2 mg total) by mouth 2 (two) times daily. Patient taking differently: Take 1 tablet by mouth daily. 05/23/23   Will Almarie MATSU, MD  sodium chloride  (OCEAN) 0.65 % SOLN nasal spray Place 1 spray into both nostrils daily at 2 am. Patient taking differently: Place 1 spray into both nostrils as needed for congestion. 04/24/23   Will Almarie MATSU, MD  tamsulosin  (FLOMAX ) 0.4 MG  CAPS capsule Take 0.8 mg by mouth in the morning.    [provider]  Tiotropium Bromide -Olodaterol 2.5-2.5 MCG/ACT AERS Inhale 2 puffs into the lungs every morning. 2 puffs    [provider]  traZODone  (DESYREL ) 50 MG tablet Take 75 mg by mouth at bedtime.    [provider]   I spent 50 minutes in care of patient including, face to face visit, coordination of care, and review of records.  Johncarlo Maalouf, DNP, AGACNP-BC [facility]  Please see [email or web address] for pager details.  From 7A-7P if no response, please call [phone]. After hours, please call ELink [phone].

## 2023-11-25 DIAGNOSIS — J441 Chronic obstructive pulmonary disease with (acute) exacerbation: Secondary | ICD-10-CM | POA: Diagnosis not present

## 2023-11-25 LAB — CBC
HCT: 41.8 % (ref 39.0–52.0)
Hemoglobin: 13.5 g/dL (ref 13.0–17.0)
MCH: 29.7 pg (ref 26.0–34.0)
MCHC: 32.3 g/dL (ref 30.0–36.0)
MCV: 91.9 fL (ref 80.0–100.0)
Platelets: 382 K/uL (ref 150–400)
RBC: 4.55 MIL/uL (ref 4.22–5.81)
RDW: 15.2 % (ref 11.5–15.5)
WBC: 19.4 K/uL — ABNORMAL HIGH (ref 4.0–10.5)
nRBC: 0 % (ref 0.0–0.2)

## 2023-11-25 LAB — BASIC METABOLIC PANEL WITH GFR
Anion gap: 11 (ref 5–15)
BUN: 18 mg/dL (ref 8–23)
CO2: 25 mmol/L (ref 22–32)
Calcium: 9.3 mg/dL (ref 8.9–10.3)
Chloride: 100 mmol/L (ref 98–111)
Creatinine, Ser: 1.06 mg/dL (ref 0.61–1.24)
GFR, Estimated: >60 mL/min (ref >60)
Glucose, Bld: 167 mg/dL — ABNORMAL HIGH (ref 70–99)
Potassium: 4.6 mmol/L (ref 3.5–5.1)
Sodium: 137 mmol/L (ref 135–145)

## 2023-11-25 LAB — GLUCOSE, CAPILLARY
Glucose-Capillary: 160 mg/dL — ABNORMAL HIGH (ref 70–99)
Glucose-Capillary: 129 mg/dL — ABNORMAL HIGH (ref 70–99)
Glucose-Capillary: 178 mg/dL — ABNORMAL HIGH (ref 70–99)
Glucose-Capillary: 132 mg/dL — ABNORMAL HIGH (ref 70–99)

## 2023-11-25 NOTE — TOC Initial Note (Signed)
 Transition of Care [facility]) - Initial/Assessment Note    Patient Details  Name: Mario [name] MRN: [id number] Date of Birth: 12/16/1950  [facility]) CM/SW Contact:    Sheri ONEIDA Sharps, LCSW Phone Number: 11/25/2023, 11:47 AM  Clinical Narrative:                 Pt from home. Pt continues medical workup. IPCM following for dc needs.    Barriers to Discharge: Continued Medical Work up   Patient Goals and CMS Choice Patient states their goals for this hospitalization and ongoing recovery are:: return to baseline   Choice offered to / list presented to : NA      Expected Discharge Plan and Services In-house Referral: NA Discharge Planning Services: NA   Living arrangements for the past 2 months: Single Family Home                 DME Arranged: N/A DME Agency: NA       HH Arranged: NA HH Agency: NA        Prior Living Arrangements/Services Living arrangements for the past 2 months: Single Family Home Lives with:: Friends Patient language and need for interpreter reviewed:: Yes Do you feel safe going back to the place where you live?: Yes      Need for Family Participation in Patient Care: Yes (Comment) Care giver support system in place?: Yes (comment)   Criminal Activity/Legal Involvement Pertinent to Current Situation/Hospitalization: No - Comment as needed  Activities of Daily Living   ADL Screening (condition at time of admission) Independently performs ADLs?: Yes (appropriate for developmental age) Is the patient deaf or have difficulty hearing?: No Does the patient have difficulty seeing, even when wearing glasses/contacts?: No Does the patient have difficulty concentrating, remembering, or making decisions?: No  Permission Sought/Granted                  Emotional Assessment Appearance:: Appears stated age Attitude/Demeanor/Rapport: Engaged Affect (typically observed): Accepting Orientation: : Oriented to Self, Oriented to Place,  Oriented to  Time, Oriented to Situation Alcohol  / Substance Use: Not Applicable Psych Involvement: No (comment)  Admission diagnosis:  COPD exacerbation (HCC) [J44.1] Patient Active Problem List   Diagnosis Date Noted   COPD exacerbation (HCC) 11/24/2023   DNR (do not resuscitate)/DNI(Do not intubate) 05/24/2023   Acute on chronic respiratory failure with hypoxia (HCC) - chronically on 5 L/min home O2. 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 ENT consulted. nasal pack placed. 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 09/21/2020   Constipation    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   Stage 3a chronic kidney  disease (HCC) 12/26/2019   COPD (chronic obstructive pulmonary disease) (HCC) - Pt frequently wheezes after physical exertion. This does NOT mean he has a COPD exacerbation.    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   DM2 (diabetes mellitus, type 2) (HCC) 07/16/2017   HLA B27 (HLA B27 positive) 07/16/2017   Malingering 01/21/2013    Tobacco abuse 01/11/2013   Obesity, unspecified 01/11/2013   Hypertension associated with diabetes (HCC) 01/10/2013   PCP:  Clinic, [name] Pharmacy:   [facility] - [place], [place] - [address] [place] [address] Phone: [phone] Fax: [phone]     Social Drivers of Health (SDOH) Social History: SDOH Screenings   Food Insecurity: No Food Insecurity (11/24/2023)  Housing: Low Risk  (11/24/2023)  Transportation Needs: No Transportation Needs (11/24/2023)  Utilities: Not At Risk (11/24/2023)  Financial Resource Strain: Low Risk  (09/23/2020)  Social Connections: Socially Isolated (11/24/2023)  Stress: No Stress Concern Present (09/23/2020)  Tobacco Use: Medium Risk (11/23/2023)   SDOH Interventions:     Readmission Risk Interventions    05/06/2023    3:25 PM 04/24/2023   10:09 AM 03/25/2023    2:23 PM  Readmission Risk Prevention Plan  Transportation Screening Complete Complete Complete  Medication Review Oceanographer) Complete Complete Complete  PCP or Specialist appointment within 3-5 days of discharge Complete    HRI or Home Care Consult Complete Complete Complete  SW Recovery Care/Counseling Consult Complete Complete Complete  Palliative Care Screening Not Applicable Not Applicable Not Applicable  Skilled Nursing Facility Not Applicable Not Applicable Not Applicable

## 2023-11-25 NOTE — Progress Notes (Signed)
 Progress Note   Patient: Mario [name] FMW:[id number] DOB: [birth year] DOA: 11/23/2023     1 DOS: the patient was seen and examined on 11/25/2023   Brief hospital course: 73yo with h/o ankylosing spondylitis, COPD on 6-8L home O2, depression with prior suicide attempt, PE, left lung CA s/p radiation, HTN, and OSA who presented on 9/25 with SOB.  He was diagnosed with COPD exacerbation and initially required BIPAP but was able to transition to HFNC O2.  Started on antibiotics, steroids. Nebs.  Pulmonology is consulting.  Assessment and Plan:  Acute on chronic respiratory failure associated with a COPD exacerbation Patient's shortness of breath and productive cough are most likely caused by acute COPD exacerbation.  O2 sats as low as 86% Transient BIPAP -> HFNC O2 He has history of O2-dependent COPD and is generally wearing 8L at home He has been noncompliant with pulmonology f/u appointments because he gets too winded trying to walk to the [employer] outside to take him there and so doesn't go She does not have fever and has mild leukocytosis Chest x-ray is not consistent with pneumonia but lists concern for multifocal semi-invasive peripheral adenocarcinoma Admitted to stepdown Nebulizers: scheduled Duoneb and prn albuterol  Solu-Medrol   -> Prednisone   IV Azithromycin /Cefepime  for now, but may be able to narrow coverage Budesonide  nebs for Asmanex  Hold tiotropium-olodaterol Coordinated care with [facility] team/PT/OT/RT consults Pulmonology consulted He reports having had a palliative visit at home previously, likely a reasonable palliative vs. Hospice candidate   H/o Left lung CA S/p radiation Current CXR read with concern for multifocal semi-invasive peripheral adenocarcinoma Pulmonology consulted   Ankylosing spondylitis Resume home pain control regimen of gabapentin , cyclobenzaprine , Norco   Depression with prior suicide attempt Continue home bupropion , buspirone , duloxetine ,  oxcarbazepine , trazodone    HTN Resume diltiazem    BPH Continue tamsulosin    DM Recent A1c 6.7, good control Hold Glucophage  Cover with moderate-scale SSI Carb modified diet    OSA BIPAP at bedside for prn use   Stage 3a CKD Appears to be stable at this time Attempt to avoid nephrotoxic medications Recheck BMP in AM    Obesity Body mass index is 36.17 kg/m.SABRA  Weight loss should be encouraged Outpatient PCP/bariatric medicine f/u encouraged Significantly low or high BMI is associated with higher medical risk including morbidity and mortality   Nutrition Status: Nutrition Problem: Increased nutrient needs Etiology: chronic illness Signs/Symptoms: estimated needs Interventions: Ensure Enlive (each supplement provides 350kcal and 20 grams of protein), MVI   DNR I have discussed code status with the patient and he would not desire resuscitation and would prefer to die a natural death should that situation arise. [facility] documents reviewed Patient will need a gold out of facility DNR form at the time of discharge     Consultants: Pulmonology PT OT RT TOC team  Procedures: None  Antibiotics: Azithromycin  9/26-29 Cefepime  9/26-10/2   30 Day Unplanned Readmission Risk Score    Flowsheet Row ED to Hosp-Admission (Current) from 11/23/2023 in [facility]-ICU/STEPDOWN  30 Day Unplanned Readmission Risk Score (%) 52.28 Filed at 11/25/2023 0800    This score is the patient's risk of an unplanned readmission within 30 days of being discharged (0 -100%). The score is based on dignosis, age, lab data, medications, orders, and past utilization.   Low:  0-14.9   Medium: 15-21.9   High: 22-29.9   Extreme: 30 and above           Subjective: Cough is improving, back on home O2.  Overall, he is improving.  Didn't sleep well so he is more tired today.   Objective: Vitals:   11/25/23 1100 11/25/23 1200  BP:    Pulse: 91 89  Resp: 19 18  Temp:  97.7  F (36.5 C)  SpO2: 94% 99%    Intake/Output Summary (Last 24 hours) at 11/25/2023 1324 Last data filed at 11/25/2023 1251 Gross per 24 hour  Intake 560 ml  Output 1800 ml  Net -1240 ml   Filed Weights   11/23/23 2308 11/24/23 0445  Weight: 127 kg 127.8 kg    Exam:  General:  Appears calm and comfortable and is in NAD, comfortable on HFNC O2 (8L, which is home O2) Eyes:   EOMI, normal lids, iris ENT:  grossly normal hearing, lips & tongue, mmm Cardiovascular:  RR with mild tachycardia. No LE edema.  Respiratory:   Improved air movement with some wheezing.  Mildly increased respiratory effort. 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  Pertinent labs for today include:   Glucose 167 WBC 19.4 (steroids) MRSA PCR    Family Communication: None present      Code Status: Limited: Do not attempt resuscitation (DNR) -DNR-LIMITED -Do Not Intubate/DNI    Disposition: Status is: Inpatient Remains inpatient appropriate because: ongoing management; will transfer to progressive today     Time spent: 50 minutes  Unresulted Labs (From admission, onward)     Start     Ordered   11/26/23 0500  CBC with Differential/Platelet  Tomorrow morning,   R       Question:  Specimen collection method  Answer:  Lab=Lab collect   11/25/23 1324   11/26/23 0500  Basic metabolic panel with GFR  Tomorrow morning,   R       Question:  Specimen collection method  Answer:  Lab=Lab collect   11/25/23 1324   11/24/23 0323  Expectorated Sputum Assessment w Gram Stain, Rflx to Resp Cult  (COPD / Pneumonia /  Cellulitis / Lower Extremity Wound (Diabetic Foot Infection))  Once,   R        11/24/23 9674             Author: Delon Herald, MD 11/25/2023 1:24 PM  For on call review [email or web address].

## 2023-11-25 NOTE — Hospital Course (Signed)
 72yo with h/o ankylosing spondylitis, COPD on 6-8L home O2, depression with prior suicide attempt, PE, left lung CA s/p radiation, HTN, and OSA who presented on 9/25 with SOB.  He was diagnosed with COPD exacerbation and initially required BIPAP but was able to transition to HFNC O2.  Started on antibiotics, steroids. Nebs.  Pulmonology is consulting.

## 2023-11-25 NOTE — Plan of Care (Signed)
  Problem: Education: Goal: Knowledge of General Education information will improve Description: Including pain rating scale, medication(s)/side effects and non-pharmacologic comfort measures Outcome: Progressing   Problem: Health Behavior/Discharge Planning: Goal: Ability to manage health-related needs will improve Outcome: Progressing   Problem: Clinical Measurements: Goal: Ability to maintain clinical measurements within normal limits will improve Outcome: Progressing Goal: Diagnostic test results will improve Outcome: Progressing Goal: Respiratory complications will improve Outcome: Progressing Goal: Cardiovascular complication will be avoided Outcome: Progressing   Problem: Nutrition: Goal: Adequate nutrition will be maintained Outcome: Progressing   Problem: Elimination: Goal: Will not experience complications related to bowel motility Outcome: Not Progressing Goal: Will not experience complications related to urinary retention Outcome: Progressing   Problem: Activity: Goal: Will verbalize the importance of balancing activity with adequate rest periods Outcome: Progressing   Problem: Respiratory: Goal: Ability to maintain a clear airway will improve Outcome: Progressing

## 2023-11-26 DIAGNOSIS — J441 Chronic obstructive pulmonary disease with (acute) exacerbation: Secondary | ICD-10-CM | POA: Diagnosis not present

## 2023-11-26 LAB — GLUCOSE, CAPILLARY
Glucose-Capillary: 146 mg/dL — ABNORMAL HIGH (ref 70–99)
Glucose-Capillary: 145 mg/dL — ABNORMAL HIGH (ref 70–99)
Glucose-Capillary: 139 mg/dL — ABNORMAL HIGH (ref 70–99)
Glucose-Capillary: 107 mg/dL — ABNORMAL HIGH (ref 70–99)

## 2023-11-26 LAB — CBC WITH DIFFERENTIAL/PLATELET
Abs Immature Granulocytes: 0.11 K/uL — ABNORMAL HIGH (ref 0.00–0.07)
Basophils Absolute: 0.1 K/uL (ref 0.0–0.1)
Basophils Relative: 1 %
Eosinophils Absolute: 0.7 K/uL — ABNORMAL HIGH (ref 0.0–0.5)
Eosinophils Relative: 4 %
HCT: 42.7 % (ref 39.0–52.0)
Hemoglobin: 13.2 g/dL (ref 13.0–17.0)
Immature Granulocytes: 1 %
Lymphocytes Relative: 17 %
Lymphs Abs: 2.8 K/uL (ref 0.7–4.0)
MCH: 28.9 pg (ref 26.0–34.0)
MCHC: 30.9 g/dL (ref 30.0–36.0)
MCV: 93.4 fL (ref 80.0–100.0)
Monocytes Absolute: 1.6 K/uL — ABNORMAL HIGH (ref 0.1–1.0)
Monocytes Relative: 10 %
Neutro Abs: 11.4 K/uL — ABNORMAL HIGH (ref 1.7–7.7)
Neutrophils Relative %: 67 %
Platelets: 377 K/uL (ref 150–400)
RBC: 4.57 MIL/uL (ref 4.22–5.81)
RDW: 15.3 % (ref 11.5–15.5)
WBC: 16.8 K/uL — ABNORMAL HIGH (ref 4.0–10.5)
nRBC: 0 % (ref 0.0–0.2)

## 2023-11-26 LAB — BASIC METABOLIC PANEL WITH GFR
Anion gap: 11 (ref 5–15)
BUN: 19 mg/dL (ref 8–23)
CO2: 25 mmol/L (ref 22–32)
Calcium: 8.9 mg/dL (ref 8.9–10.3)
Chloride: 101 mmol/L (ref 98–111)
Creatinine, Ser: 0.9 mg/dL (ref 0.61–1.24)
GFR, Estimated: >60 mL/min (ref >60)
Glucose, Bld: 117 mg/dL — ABNORMAL HIGH (ref 70–99)
Potassium: 3.8 mmol/L (ref 3.5–5.1)
Sodium: 136 mmol/L (ref 135–145)

## 2023-11-26 NOTE — Evaluation (Signed)
 Physical Therapy Evaluation Patient Details Name: Mario [name] MRN: [id number] DOB: Aug 11, 1950 Today's Date: 11/26/2023  History of Present Illness  Pt is a 73 y.o. male presenting to [facility] with increased shortness of breath with productive cough, LBP and B LE weakness. Pt has had multiple recent admissions due to acute restpiraotry failure. Pt is chronically hypoxic and on 6 L/min supplemental O2, imaging reveals PNA with COPD changes. Pt PMH Includes but is not limited to: non-small cell lung cancer status post SBRT, vasculitis and ankylosing spondylitis, PE on Eliquis , OSA with CPAP intolerance, type 2 diabetes mellitus, chronic pain, depression, anxiety, and COPD with chronic hypoxic respiratory failure.  Clinical Impression  Pt admitted with above diagnosis. Pt reports he's had increased dyspnea walking short household distances and with doing 5 stairs to get to his bedroom at home. At baseline he is on 8L O2 and walks without an assistive device. He takes frequent seated rest breaks 2* dyspnea at home. Today with sit to stand, SpO2 dropped to 84% on 11L O2. SpO2 90% at rest sitting edge of bed on 11L O2. He has had a significant decline in activity tolerance. Pt plans to DC home. He reports he's not able to walk to the transportation [facility] to get to appointments. He'd benefit from a motorized scooter.  Pt currently with functional limitations due to the deficits listed below (see PT Problem List). Pt will benefit from acute skilled PT to increase their independence and safety with mobility to allow discharge.           If plan is discharge home, recommend the following: A little help with bathing/dressing/bathroom;Assistance with cooking/housework;Assist for transportation;Help with stairs or ramp for entrance   Can travel by private vehicle        Equipment Recommendations Other (comment) (would benefit from motorized WC 2* severe dyspnea with minimal activity)  Recommendations for Other  Services       Functional Status Assessment Patient has had a recent decline in their functional status and demonstrates the ability to make significant improvements in function in a reasonable and predictable amount of time.     Precautions / Restrictions Precautions Precautions: Fall;Other (comment) Recall of Precautions/Restrictions: Intact Restrictions Weight Bearing Restrictions Per Provider Order: No      Mobility  Bed Mobility               General bed mobility comments: NT-pt sitting at EOB at start of session    Transfers Overall transfer level: Needs assistance Equipment used: Rolling walker (2 wheels) Transfers: Sit to/from Stand Sit to Stand: Contact guard assist           General transfer comment: STS from edge of bed, one side step towards HOB without UE support; SpO2 dropped to 84% on 11L with this activity. SpO2 90% at rest on 11L sitting edge of bed.    Ambulation/Gait               General Gait Details: deferred 2* 4/4 dyspnea with sit to stand  Stairs            Wheelchair Mobility     Tilt Bed    Modified Rankin (Stroke Patients Only)       Balance Overall balance assessment: Mild deficits observed, not formally tested  Pertinent Vitals/Pain Pain Assessment Pain Assessment: No/denies pain    Home Living Family/patient expects to be discharged to:: Private residence Living Arrangements: Non-relatives/Friends Available Help at Discharge: Available 24 hours/day (Has housemate. HH Aide 3days a week, 5 hours a visit.) Type of Home: House Home Access: Level entry     Alternate Level Stairs-Number of Steps: Split Level : enter on main level with kitchen/living  up 5 steps to bedroom and bathroom Home Layout: Two level Home Equipment: Cane - single point;Shower Counsellor (2 wheels);Rollator (4 wheels);Other (comment);Hand held shower head;Adaptive  equipment Additional Comments: housemate lives across the hall helps too; [place] aide comes 5 hours/day 3 days/week helps with cleaning; [place] for transportation (though recently couldn't walk to the [place]). On 8L O2 at home    Prior Function Prior Level of Function : Needs assist       Physical Assist : ADLs (physical)   ADLs (physical): Dressing;IADLs Mobility Comments: Ambulates in home; gets SOB easily, seated rest breaks to recover; walks without AD; no falls in past 6 months. On 8L O2 at home. ADLs Comments: sits to shower, showering is very taxing; aide helps with dressing, pt showers independently with Aide nearby for safety. Housemate does havey housework and Aide does light housework. Aide does meal prep and laundry but cannot provide transportation.     Extremity/Trunk Assessment   Upper Extremity Assessment Upper Extremity Assessment:  (Unable to formally test due to SHOB/DOE. Appears functional for ADLs, light activity.)    Lower Extremity Assessment Lower Extremity Assessment: Defer to PT evaluation RLE Deficits / Details: reports 2 weeks of R worse than L numbness in B feet, especially medially and on bottoms of feet RLE Sensation: decreased light touch LLE Deficits / Details: reports 2 weeks of R worse than L numbness in B feet, especially medially and on bottoms of feet LLE Sensation: decreased light touch    Cervical / Trunk Assessment Cervical / Trunk Assessment: Normal Cervical / Trunk Exceptions: Reports ~2 weeks of RT worse than LT of burning and numbness.  Communication   Communication Communication: Impaired (Increased efffort and desat with speaking while sitting EOB.  Simultaneous filing. User may not have seen previous data.)    Cognition       PT - Cognitive impairments: No apparent impairments                                 Cueing       General Comments      Exercises     Assessment/Plan    PT Assessment Patient needs continued  PT services  PT Problem List Decreased activity tolerance;Decreased mobility       PT Treatment Interventions Gait training;Therapeutic exercise;Therapeutic activities;Functional mobility training;Patient/family education    PT Goals (Current goals can be found in the Care Plan section)  Acute Rehab PT Goals Patient Stated Goal: return home, be able to do stairs and walk household distances without getting so short of breath PT Goal Formulation: With patient Time For Goal Achievement: 12/10/23 Potential to Achieve Goals: Fair    Frequency Min 3X/week     Co-evaluation PT/OT/SLP Co-Evaluation/Treatment: Yes Reason for Co-Treatment: To address functional/ADL transfers PT goals addressed during session: Mobility/safety with mobility;Balance OT goals addressed during session: ADL's and self-care       AM-PAC PT 6 Clicks Mobility  Outcome Measure Help needed turning from your back to your side while in a  flat bed without using bedrails?: None Help needed moving from lying on your back to sitting on the side of a flat bed without using bedrails?: A Little Help needed moving to and from a bed to a chair (including a wheelchair)?: A Little Help needed standing up from a chair using your arms (e.g., wheelchair or bedside chair)?: A Little Help needed to walk in hospital room?: A Little Help needed climbing 3-5 steps with a railing? : A Little 6 Click Score: 19    End of Session Equipment Utilized During Treatment: Oxygen  Activity Tolerance: Patient limited by fatigue Patient left: in bed;with call bell/phone within reach Nurse Communication: Mobility status PT Visit Diagnosis: Difficulty in walking, not elsewhere classified (R26.2)    Time: 9058-8992 PT Time Calculation (min) (ACUTE ONLY): 26 min   Charges:   PT Evaluation $PT Eval Moderate Complexity: 1 Mod PT Treatments $Therapeutic Activity: 8-22 mins PT General Charges $$ ACUTE PT VISIT: 1 Visit          Sylvan Delon Copp PT 11/26/2023  Acute Rehabilitation Services  Office [phone]

## 2023-11-26 NOTE — Evaluation (Signed)
 Occupational Therapy Evaluation Patient Details Name: Mario [name] MRN: [id number] DOB: [birth year] Today's Date: 11/26/2023   History of Present Illness   Pt is a 73 y.o. male presenting to [facility] with increased shortness of breath with productive cough, LBP and B LE weakness. Pt has had multiple recent admissions due to acute restpiraotry failure. Pt is chronically hypoxic and on 6 L/min supplemental O2, imaging reveals PNA with COPD changes. Pt PMH Includes but is not limited to: non-small cell lung cancer status post SBRT, vasculitis and ankylosing spondylitis, PE on Eliquis , OSA with CPAP intolerance, type 2 diabetes mellitus, chronic pain, depression, anxiety, and COPD with chronic hypoxic respiratory failure.     Clinical Impressions Patient is currently requiring as high as Minimal assistance with basic LB ADLs, as well as close supervision with functional transfers to stand and take 1-2 steps at EOB with usee of O2 at 11L.   Current level of function is below patient's typical baseline.    During this evaluation, patient was limited by generalized weakness, severely impaired activity tolerance, and chronic LBP and acute B feet burning and numbness, all of which has the potential to impact patient's and/or caregivers' safety and independence during functional mobility, as well as performance for ADLs.    Patient lives with a housemate who able to provide some supervision and assistance with heavy housework. Pt has an Aide 3x/wk for 5 hours a visit for other IADLs and supervision with showers  Patient demonstrates fair rehab potential due to poor prognosis, but may benefit from continued skilled occupational therapy services while in acute care to maximize safety, independence and quality of life at home.  Home health OT and motorized WC recommended.  ?      If plan is discharge home, recommend the following:   A little help with bathing/dressing/bathroom;Assistance with  cooking/housework;Assist for transportation     Functional Status Assessment   Patient has had a recent decline in their functional status and demonstrates the ability to make significant improvements in function in a reasonable and predictable amount of time.     Equipment Recommendations   BSC/3in1 Nurse, adult. [facility] [place] [facility] with [facility] lift.)     Recommendations for Other Services         Precautions/Restrictions   Precautions Precaution/Restrictions Comments: Pulmonary: Mon O2 (Simultaneous filing. User may not have seen previous data.)     Mobility Bed Mobility               General bed mobility comments: NT-pt sitting at EOB at start of session    Transfers                   General transfer comment: STS from edge of bed, one side step towards [facility] without UE support; SpO2 dropped to 84% on 11L with this activity      Balance Overall balance assessment: Mild deficits observed, not formally tested                                         ADL either performed or assessed with clinical judgement   ADL Overall ADL's : Needs assistance/impaired Eating/Feeding: Independent   Grooming: Sitting;Set up   Upper Body Bathing: Modified independent;Sitting Upper Body Bathing Details (indicate cue type and reason): Pt educated to allow his Aide to towel him off after  shower to save energy. Pt uses shower chair and long bath brush at baseline. Lower Body Bathing: Supervison/ safety;Sitting/lateral leans;With adaptive equipment   Upper Body Dressing : Set up;Sitting Upper Body Dressing Details (indicate cue type and reason): Assisted for lines   Lower Body Dressing Details (indicate cue type and reason): Pt refused to try due to LBP at EOB. Pt refused recliner. Toilet Transfer: Radiographer, therapeutic Details (indicate cue type and reason): Pt stood from EOB and took ~2 steps  then returned to EOB all with SVN. Noted pt with apical breathing at EOB after activity. Declined supine I can't breath in bed. Toileting- Clothing Manipulation and Hygiene: Supervision/safety;Sitting/lateral lean       Functional mobility during ADLs: Supervision/safety       Vision   Vision Assessment?: No apparent visual deficits     Perception         Praxis         Pertinent Vitals/Pain Pain Assessment Pain Assessment: Faces Faces Pain Scale: Hurts little more Pain Location: Chronic LBP with some LE ROM testing. Pain Intervention(s): Limited activity within patient's tolerance, Monitored during session     Extremity/Trunk Assessment Upper Extremity Assessment Upper Extremity Assessment:  (Unable to formally test due to SHOB/DOE. Appears functional for ADLs, light activity.)   Lower Extremity Assessment Lower Extremity Assessment: Defer to PT evaluation RLE Deficits / Details: reports 2 weeks of R worse than L numbness in B feet, especially medially and on bottoms of feet RLE Sensation: decreased light touch LLE Deficits / Details: reports 2 weeks of R worse than L numbness in B feet, especially medially and on bottoms of feet LLE Sensation: decreased light touch   Cervical / Trunk Assessment Cervical / Trunk Assessment: Normal Cervical / Trunk Exceptions: Reports ~2 weeks of RT worse than LT of burning and numbness.   Communication Communication Communication: Impaired (Increased efffort and desat with speaking while sitting EOB.  Simultaneous filing. User may not have seen previous data.)   Cognition Arousal: Alert (Simultaneous filing. User may not have seen previous data.) Behavior During Therapy: Flat affect (Simultaneous filing. User may not have seen previous data.) Cognition: No apparent impairments                               Following commands: Intact (Pt has his own way of doing things and is not always agreeable to cues.   Simultaneous filing. User may not have seen previous data.)       Cueing  General Comments          Exercises Other Exercises Other Exercises: Pt left with EC handout. Pt reports he is knowledgeable to concepts.   Shoulder Instructions      Home Living Family/patient expects to be discharged to:: Private residence Living Arrangements: Non-relatives/Friends Available Help at Discharge: Available 24 hours/day (Has housemate. HH Aide 3days a week, 5 hours a visit.) Type of Home: House Home Access: Level entry     Home Layout: Two level Alternate Level Stairs-Number of Steps: Split Level : enter on main level with kitchen/living  up 5 steps to bedroom and bathroom Alternate Level Stairs-Rails: Left Bathroom Shower/Tub: Tub/shower unit   Bathroom Toilet: Standard Bathroom Accessibility: Yes   Home Equipment: Cane - single point;Shower Counsellor (2 wheels);Rollator (4 wheels);Other (comment);Hand held shower head;Adaptive equipment Adaptive Equipment: Reacher;Long-handled sponge Additional Comments: housemate lives across the hall helps too; [place] aide comes 5 hours/day 3  days/week helps with cleaning; [place] for transportation (though recently couldn't walk to the [place]). On 8L O2 at home      Prior Functioning/Environment Prior Level of Function : Needs assist       Physical Assist : ADLs (physical)   ADLs (physical): Dressing;IADLs Mobility Comments: Ambulates in home; gets SOB easily, seated rest breaks to recover; walks without AD; no falls in past 6 months. On 8L O2 at home. ADLs Comments: sits to shower, showering is very taxing; aide helps with dressing, pt showers independently with Aide nearby for safety. Housemate does havey housework and Aide does light housework. Aide does meal prep and laundry but cannot provide transportation.    OT Problem List: Decreased activity tolerance;Obesity;Decreased knowledge of use of DME or AE;Cardiopulmonary status limiting  activity   OT Treatment/Interventions: Self-care/ADL training;Therapeutic activities;Energy conservation;DME and/or AE instruction;Patient/family education      OT Goals(Current goals can be found in the care plan section)   Acute Rehab OT Goals Patient Stated Goal: To go home. Agreeable to [facility] PT and OT OT Goal Formulation: With patient Time For Goal Achievement: 12/10/23 Potential to Achieve Goals: Fair (Poor prognosis.) ADL Goals Pt Will Perform Lower Body Dressing: sitting/lateral leans;sit to/from stand;with modified independence;with adaptive equipment Pt Will Transfer to Toilet: with modified independence;ambulating (Short distances) Pt Will Perform Toileting - Clothing Manipulation and hygiene: with modified independence;with adaptive equipment;sitting/lateral leans;sit to/from stand Additional ADL Goal #1: Patient will identify the 5 P's of energy conservation strategies to employ at home in order to maximize function and quality of life and decrease caregiver burden while preventing exacerbation of symptoms and rehospitalization.   OT Frequency:  Min 2X/week    Co-evaluation PT/OT/SLP Co-Evaluation/Treatment: Yes Reason for Co-Treatment: To address functional/ADL transfers PT goals addressed during session: Mobility/safety with mobility;Balance OT goals addressed during session: ADL's and self-care      AM-PAC OT 6 Clicks Daily Activity     Outcome Measure Help from another person eating meals?: None Help from another person taking care of personal grooming?: A Little Help from another person toileting, which includes using toliet, bedpan, or urinal?: A Little Help from another person bathing (including washing, rinsing, drying)?: A Little Help from another person to put on and taking off regular upper body clothing?: A Little Help from another person to put on and taking off regular lower body clothing?: A Little 6 Click Score: 19   End of Session Equipment  Utilized During Treatment: Oxygen  Nurse Communication: Other (comment) (O2 levels. Pt at EOB without bed alarm)  Activity Tolerance: Patient limited by fatigue;Patient limited by pain Patient left: with call bell/phone within reach  OT Visit Diagnosis: Pain (decreased ADLS) Pain - part of body:  (LBP)                Time: 9061-8993 OT Time Calculation (min): 28 min Charges:  OT General Charges $OT Visit: 1 Visit OT Evaluation $OT Eval Moderate Complexity: 1 Mod  Jessilyn Catino, OT Acute Rehab Services Office: [phone] 11/26/2023   Delon Falter 11/26/2023, 10:38 AM

## 2023-11-26 NOTE — Progress Notes (Addendum)
 Progress Note   Patient: Mario [name] FMW:[id number] DOB: Dec 05, 1950 DOA: 11/23/2023     2 DOS: the patient was seen and examined on 11/26/2023   Brief hospital course: 72yo with h/o ankylosing spondylitis, COPD on 6-8L home O2, depression with prior suicide attempt, PE, left lung CA s/p radiation, HTN, and OSA who presented on 9/25 with SOB.  He was diagnosed with COPD exacerbation and initially required BIPAP but was able to transition to HFNC O2.  Started on antibiotics, steroids. Nebs.  Pulmonology is consulting.  Assessment and Plan:  Acute on chronic respiratory failure associated with a COPD exacerbation Patient's shortness of breath and productive cough are most likely caused by acute COPD exacerbation.  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 Hospice candidate He appears to be improving/stabilizing, although  he is tenuous even at baseline Will transfer to progressive, continue to monitor for now Add IS, flutter valve Possible dc in 24-48 hours   H/o Left lung CA S/p radiation Current CXR read with concern for multifocal semi-invasive peripheral adenocarcinoma Pulmonology  consulted   Ankylosing spondylitis Resume home pain control regimen of gabapentin , cyclobenzaprine , Norco   Depression with prior suicide attempt Continue home bupropion , buspirone , duloxetine , oxcarbazepine , trazodone    HTN Resume diltiazem    BPH Continue tamsulosin    DM Recent A1c 6.7, good control Hold Glucophage  Cover with moderate-scale SSI Carb modified diet    OSA BIPAP at bedside for prn use   Stage 3a CKD Appears to be stable at this time Attempt to avoid nephrotoxic medications Recheck BMP in AM    Obesity Body mass index is 36.17 kg/m.SABRA  Weight loss should be encouraged Outpatient PCP/bariatric medicine f/u encouraged Significantly low or high BMI is associated with higher medical risk including morbidity and mortality    Nutrition Status: Nutrition Problem: Increased nutrient needs Etiology: chronic illness Signs/Symptoms: estimated needs Interventions: Ensure Enlive (each supplement provides 350kcal and 20 grams of protein), MVI   DNR I have discussed code status with the patient and he would not desire resuscitation and would prefer to die a natural death should that situation arise. [facility] documents reviewed Patient will need a gold out of facility DNR form at the time of discharge        Consultants: Pulmonology Palliative care PT OT RT [facility] team   Procedures: None   Antibiotics: Azithromycin  9/26-29 Cefepime  9/26-10/2      30 Day Unplanned Readmission Risk Score    Flowsheet Row ED to Hosp-Admission (Current) from 11/23/2023 in [place] 4TH FLOOR PROGRESSIVE CARE AND UROLOGY  30 Day Unplanned Readmission Risk Score (%) 49.72 Filed at 11/26/2023 1600    This score is the patient's risk of an unplanned readmission within 30 days of being discharged (0 -100%). The score  is based on dignosis, age, lab data, medications, orders, and past utilization.   Low:  0-14.9   Medium: 15-21.9   High: 22-29.9   Extreme: 30 and above            Subjective: Thin pink-tinged sputum today.  Slightly increased O2 need with exertion compared to baseline.  Some neuropathy.  No recent Bm.   Objective: Vitals:   11/26/23 1200 11/26/23 1453  BP:  (!) 158/84  Pulse: 89 98  Resp: 18 17  Temp:  98.1 F (36.7 C)  SpO2: 95% 92%    Intake/Output Summary (Last 24 hours) at 11/26/2023 1611 Last data filed at 11/26/2023 1315 Gross per 24 hour  Intake 309.87 ml  Output 1450 ml  Net -1140.13 ml   Filed Weights   11/23/23 2308 11/24/23 0445  Weight: 127 kg 127.8 kg    Exam:  General:  Appears calm and comfortable and is in NAD, comfortable on HFNC O2 Eyes:   EOMI, normal lids, iris ENT:  grossly normal hearing, lips & tongue, mmm Cardiovascular:  RR with mild tachycardia. No LE edema.  Respiratory:   Improved air movement with some wheezing.  Mildly increased respiratory effort. 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  Pertinent labs for today include:    Unremarkable BMP WBC 16.8     Family Communication: None present  Mobility: PT/OT Consulted and are recommending - Home Health Pt9/28/2025 1034    Code Status: Limited: Do not attempt resuscitation (DNR) -DNR-LIMITED -Do Not Intubate/DNI    Disposition: Status is: Inpatient Remains inpatient appropriate because: ongoing monitoring     Time spent: 50 minutes  Unresulted Labs (From admission, onward)     Start     Ordered   11/24/23 0323  Expectorated Sputum Assessment w Gram Stain, Rflx to Resp Cult  (COPD / Pneumonia / Cellulitis / Lower Extremity Wound (Diabetic Foot  Infection))  Once,   R        11/24/23 9674             Author: Delon Herald, MD 11/26/2023 4:11 PM  For on call review [email or web address].

## 2023-11-27 DIAGNOSIS — Z7189 Other specified counseling: Secondary | ICD-10-CM

## 2023-11-27 DIAGNOSIS — J441 Chronic obstructive pulmonary disease with (acute) exacerbation: Secondary | ICD-10-CM | POA: Diagnosis not present

## 2023-11-27 DIAGNOSIS — Z515 Encounter for palliative care: Secondary | ICD-10-CM

## 2023-11-27 LAB — GLUCOSE, CAPILLARY
Glucose-Capillary: 141 mg/dL — ABNORMAL HIGH (ref 70–99)
Glucose-Capillary: 166 mg/dL — ABNORMAL HIGH (ref 70–99)
Glucose-Capillary: 184 mg/dL — ABNORMAL HIGH (ref 70–99)
Glucose-Capillary: 186 mg/dL — ABNORMAL HIGH (ref 70–99)

## 2023-11-27 NOTE — Plan of Care (Signed)

## 2023-11-27 NOTE — Plan of Care (Signed)
  Problem: Education: Goal: Knowledge of General Education information will improve Description: Including pain rating scale, medication(s)/side effects and non-pharmacologic comfort measures Outcome: Progressing   Problem: Health Behavior/Discharge Planning: Goal: Ability to manage health-related needs will improve Outcome: Progressing   Problem: Clinical Measurements: Goal: Ability to maintain clinical measurements within normal limits will improve Outcome: Not Progressing Goal: Respiratory complications will improve Outcome: Not Progressing   Problem: Activity: Goal: Risk for activity intolerance will decrease Outcome: Progressing   Problem: Coping: Goal: Level of anxiety will decrease Outcome: Progressing

## 2023-11-27 NOTE — Consult Note (Addendum)
 Consultation Note Date: 11/27/2023   Patient Name: Mario [name]  DOB: 04/20/1950  MRN: [id number]  Age / Sex: 73 y.o., male  PCP: [name] Referring Physician: Barbarann Nest, MD  Reason for Consultation: Establishing goals of care and Non pain symptom management  HPI/Patient Profile: 73 y.o. male  with past medical history of ankylosing spondylitis, COPD on 6-8L home O2, depression with prior suicide attempt, PE, left lung CA s/p radiation, HTN, and OSA admitted on 11/23/2023 with shortness of breath with productive cough.   Clinical Assessment and Goals of Care: Consult received and chart review completed. I met today at Mario [name]'s bedside. No family present. He is sitting up and able to speak with me - on 9L [place] currently - uses 8L [place] at home baseline most recently. He does become notable short of breath after some time of discussion. He shares that he does not yet feel back to his baseline but getting close - he reports that he may be ready to return home tomorrow.   I spent time discussing with Mario [name] his worsening lung function. He reports that he did well from his cancer. I shared that I am more worried about his COPD and along with infections and cancer (as well as radiation) that can be contributing to worsening lung function. We discussed limited options to improve his lung function. He knows that steroids and antibiotics can sometimes help but this is not improving his overall condition which is expected to get worse with time. We discussed that he is already requiring a significant amount of oxygen  at baseline. We discussed symptom burden from disease progression and symptom management if he has ongoing dyspnea impacting his ability to function at home. We did discuss hospice and how this can help with symptom management. He is very clear that he does not desire hospice support. I  acknowledged but tried to educate why this could be beneficial for him in the future if his status worsens. He shares that he wants to continue to follow up with all his providers and ongoing work up and treatment although he does share that it is very difficult for him to get to his appointments as well.   He is focused at this time on working with the [facility] to see if he can qualify for ALF. He has 15 hours/week aide to assist with laundry/groceries/etc but needs more assistance. He has a housemate but this person smokes and he is concerned for second hand smoke and how this is impacting his lung function. [facility]. He has already received call from [facility] outpatient palliative support as well - I did share that this is helpful but limited resources for him at home.   He received call from [facility] to discuss his care. I left him to discuss with them further as this seems the most benefit to see how they can further assist him.   Updated Dr. Barbarann and RN.  Primary Decision Maker PATIENT    SUMMARY OF RECOMMENDATIONS   - DNR in place (did not discuss today) - Focused on need for new housing (ideally ALF) and getting to follow up appointments - Desires ongoing work up and treatment  Code Status/Advance Care Planning: DNR   Symptom Management:  Educated on use of low dose morphine  for dyspnea to improve symptom management and QOL. Did not order at this time. Would recommend hospice to manage this outpatient if he is needing.   Prognosis:  Overall prognosis poor with declining respiratory status.   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  Major depressive disorder, recurrent episode  Hypertension associated with diabetes (HCC)   Ankylosing spondylitis lumbar region [facility])   I have reviewed the medical record, interviewed the patient and family, and examined the patient. The following aspects are pertinent.  Past Medical History:  Diagnosis Date   Ankylosing spondylitis lumbar region [facility]) 09/25/2022   Anxiety    Bronchitis    COPD (chronic obstructive pulmonary disease) (HCC)    Depression    History of falling 07/29/2021   history of Pulmonary embolism (HCC) 07/02/2022   History of radiation therapy    Left lung- 10/05/20-10/15/20- Dr. Lynwood Nasuti   Hypertension    lung ca 09/2020   MI (myocardial infarction) [facility])    ????   On home oxygen  therapy    4L/min [place]   OSA (obstructive sleep apnea)    Suicidal ideation 06/23/2019   Suicide attempt (HCC)    Tension pneumothorax 06/27/2016   Uveitis    Social History   Socioeconomic History   Marital status: Single    Spouse name: Not on file   Number of children: Not on file   Years of education: Not on file   Highest education level: Not on file  Occupational History   Occupation: retired  Tobacco Use   Smoking status: Former    Current packs/day: 0.00    Average packs/day: 1 pack/day for 35.0 years (35.0 ttl pk-yrs)    Types: Cigarettes    Start date: 05/1981    Quit date: 05/2016    Years since quitting: 7.5    Passive exposure: Past   Smokeless tobacco: Never  Vaping Use   Vaping status: Never Used  Substance and Sexual Activity   Alcohol  use: No    Alcohol /week: 0.0 standard drinks of alcohol     Comment: denies use of any drugs or alcohol    Drug use: No   Sexual activity: Not Currently  Other Topics Concern   Not on file  Social History Narrative   Not on file   Social Drivers of Health   Financial Resource Strain: Low Risk  (09/23/2020)   Overall Financial Resource Strain (CARDIA)    Difficulty of Paying Living Expenses: Not hard at all  Food Insecurity: No Food Insecurity (11/24/2023)   Hunger Vital Sign    Worried About  Running Out of Food in the Last Year: Never true    Ran Out of Food in the Last Year: Never true  Transportation Needs: No Transportation Needs (11/24/2023)   PRAPARE - Administrator, Civil Service (Medical): No    Lack of Transportation (Non-Medical): No  Physical Activity: Not on file  Stress: No Stress Concern Present (09/23/2020)  [employer] of Occupational Health - Occupational Stress Questionnaire    Feeling of Stress : Not at all  Social Connections: Socially Isolated (11/24/2023)   Social Connection and Isolation Panel    Frequency of Communication with Friends and Family: Once a week    Frequency of Social Gatherings with Friends and Family: More than three times a week    Attends Religious Services: Never    Database administrator or Organizations: No    Attends Engineer, structural: Never    Marital Status: Divorced   Family History  Problem Relation Age of Onset   Dementia Father    Scheduled Meds:  arformoterol   15 mcg Nebulization BID   aspirin  EC  81 mg Oral Daily   budesonide   0.5 mg Nebulization BID   buPROPion   300 mg Oral q morning   busPIRone   15 mg Oral q AM   Chlorhexidine  Gluconate Cloth  6 each Topical Daily   cyclobenzaprine   20 mg Oral QHS   cycloSPORINE   1 drop Both Eyes Q12H   diltiazem   120 mg Oral Daily   docusate sodium   200 mg Oral BID   DULoxetine   60 mg Oral BID   enoxaparin  (LOVENOX ) injection  40 mg Subcutaneous Q24H   feeding supplement  237 mL Oral BID BM   ferrous sulfate   325 mg Oral Q breakfast   fluticasone   2 spray Each Nare q AM   folic acid   1 mg Oral Daily   gabapentin   100 mg Oral TID   insulin  aspart  0-15 Units Subcutaneous TID WC   multivitamin with minerals  1 tablet Oral Daily   Oxcarbazepine   600 mg Oral TID   pantoprazole   40 mg Oral Daily   predniSONE   40 mg Oral Q breakfast   revefenacin   175 mcg Nebulization Daily   senna  1 tablet Oral Daily   sodium chloride  flush  3 mL Intravenous  Q12H   tamsulosin   0.8 mg Oral q AM   traZODone   75 mg Oral QHS   Continuous Infusions:  ceFEPime  (MAXIPIME ) IV 2 g (11/27/23 1207)   PRN Meds:.acetaminophen  **OR** acetaminophen , artificial tears, bisacodyl , guaiFENesin , hydrALAZINE , HYDROcodone -acetaminophen , ipratropium-albuterol , magnesium  citrate, morphine  injection, ondansetron  **OR** ondansetron  (ZOFRAN ) IV, polyethylene glycol Allergies  Allergen Reactions   Demerol [Meperidine] Nausea And Vomiting and Other (See Comments)    Made the patient violently sick   Zocor [Simvastatin] Nausea And Vomiting and Other (See Comments)    Made patient feel very jittery, also   Beet [Beta Vulgaris] Nausea And Vomiting   Liver Nausea And Vomiting and Other (See Comments)    ANY Liver!!   Review of Systems  Constitutional:  Positive for activity change.  Respiratory:  Positive for cough and shortness of breath.     Physical Exam Constitutional:      General: He is awake.     Appearance: He is ill-appearing.  Cardiovascular:     Rate and Rhythm: Normal rate.  Pulmonary:     Comments: Dyspnea during conversation while sitting on side of the bed Abdominal:     Palpations: Abdomen is soft.  Neurological:     Mental Status: He is alert and oriented to person, place, and time.     Vital Signs: BP 124/74   Pulse 89   Temp 98.6 F (37 C) (Oral)   Resp 19   Ht 6' 2 (1.88 m)   Wt 127.8 kg   SpO2 95%   BMI 36.17  kg/m  Pain Scale: 0-10   Pain Score: 0-No pain   SpO2: SpO2: 95 % O2 Device:SpO2: 95 % O2 Flow Rate: .O2 Flow Rate (L/min): 10 L/min  IO: Intake/output summary:  Intake/Output Summary (Last 24 hours) at 11/27/2023 1209 Last data filed at 11/27/2023 1050 Gross per 24 hour  Intake 1779.22 ml  Output 4150 ml  Net -2370.78 ml    LBM: Last BM Date : 11/23/23 Baseline Weight: Weight: 127 kg Most recent weight: Weight: 127.8 kg     Palliative Assessment/Data:    Time Total: 75 min  Greater than 50%  of this  time was spent counseling and coordinating care related to the above assessment and plan.  Signed by: Bernarda Kitty, NP Palliative Medicine Team Pager # [phone] (M-F 8a-5p) Team Phone # [phone] (Nights/Weekends)

## 2023-11-27 NOTE — Progress Notes (Signed)
 Updated [place] [phone]) with patient's home health care team about plan for possible discharge tomorrow. [place] said if someone can notify him at the above number with a time frame he can make the arrangements for discharge transportation for the patient.

## 2023-11-27 NOTE — Progress Notes (Signed)
 Progress Note   Patient: Mario [name] FMW:[id number] DOB: 09/03/1950 DOA: 11/23/2023     3 DOS: the patient was seen and examined on 11/27/2023   Brief hospital course: 73yo with h/o ankylosing spondylitis, COPD on 6-8L home O2, depression with prior suicide attempt, PE, left lung CA s/p radiation, HTN, and OSA who presented on 9/25 with SOB.  He was diagnosed with COPD exacerbation and initially required BIPAP but was able to transition to HFNC O2.  Started on antibiotics, steroids. Nebs.  Pulmonology and palliative care consulted.  Assessment and Plan:  Acute on chronic respiratory failure associated with a COPD exacerbation Patient's shortness of breath and productive cough are most likely caused by acute COPD exacerbation.  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 Hospice candidate He appears to be improving/stabilizing, although  he is tenuous even at baseline Will transfer to progressive, continue to monitor for now Add IS, flutter valve Possible dc in 24-48 hours   H/o Left lung CA S/p radiation Current CXR read with concern for multifocal semi-invasive peripheral  adenocarcinoma Pulmonology consulted   Ankylosing spondylitis Resume home pain control regimen of gabapentin , cyclobenzaprine , Norco   Depression with prior suicide attempt Continue home bupropion , buspirone , duloxetine , oxcarbazepine , trazodone    HTN Resume diltiazem    BPH Continue tamsulosin    DM Recent A1c 6.7, good control Hold Glucophage  Cover with moderate-scale SSI Carb modified diet    OSA BIPAP at bedside for prn use   Stage 3a CKD Appears to be stable at this time Attempt to avoid nephrotoxic medications Recheck BMP in AM    Obesity Body mass index is 36.17 kg/m.SABRA  Weight loss should be encouraged Outpatient PCP/bariatric medicine f/u encouraged Significantly low or high BMI is associated with higher medical risk including morbidity and mortality    Nutrition Status: Nutrition Problem: Increased nutrient needs Etiology: chronic illness Signs/Symptoms: estimated needs Interventions: Ensure Enlive (each supplement provides 350kcal and 20 grams of protein), MVI   DNR I have discussed code status with the patient and he would not desire resuscitation and would prefer to die a natural death should that situation arise. [facility] documents reviewed Patient will need a gold out of facility DNR form at the time of discharge        Consultants: Pulmonology Palliative care PT OT RT [facility] team   Procedures: None   Antibiotics: Azithromycin  9/26-29 Cefepime  9/26-10/1   30 Day Unplanned Readmission Risk Score    Flowsheet Row ED to Hosp-Admission (Current) from 11/23/2023 in [facility] 4TH FLOOR PROGRESSIVE CARE AND UROLOGY  30 Day Unplanned Readmission Risk Score (%) 49.77 Filed at 11/27/2023 1200    This score is the patient's risk of an unplanned readmission within 30 days of being discharged (0 -100%). The score is  based on dignosis, age, lab data, medications, orders, and past utilization.   Low:  0-14.9   Medium: 15-21.9   High: 22-29.9   Extreme:  30 and above           Subjective: He is not sure how is feeling.  He is not sleeping well.  Breathing is maybe not that far from baseline.   Objective: Vitals:   11/27/23 0900 11/27/23 1312  BP:  137/74  Pulse:  99  Resp: 19 20  Temp:  (!) 97.2 F (36.2 C)  SpO2:  94%    Intake/Output Summary (Last 24 hours) at 11/27/2023 1424 Last data filed at 11/27/2023 1335 Gross per 24 hour  Intake 2003.27 ml  Output 3725 ml  Net -1721.73 ml   Filed Weights   11/23/23 2308 11/24/23 0445  Weight: 127 kg 127.8 kg    Exam:  General:  Appears calm and comfortable and is in NAD, comfortable on HFNC O2 Eyes:   EOMI, normal lids, iris ENT:  grossly normal hearing, lips & tongue, mmm Cardiovascular:  RR with mild tachycardia. No LE edema.  Respiratory:   Improved air movement with some wheezing.  Mildly increased respiratory effort. 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  Pertinent labs for today include:   None today     Family Communication: Declined to have me call family  Mobility: PT/OT Consulted and are recommending - Home Health Pt9/28/2025 1034    Code Status: Limited: Do not attempt resuscitation (DNR) -DNR-LIMITED -Do Not Intubate/DNI     Disposition: Status is: Inpatient Remains inpatient appropriate because: ongoing monitoring     Time spent: 50 minutes  Unresulted Labs (From admission, onward)     Start     Ordered   11/28/23 0500  CBC with Differential/Platelet  Tomorrow morning,   R       Question:  Specimen collection method  Answer:  Lab=Lab collect    11/27/23 1422   11/28/23 0500  Basic metabolic panel with GFR  Tomorrow morning,   R       Question:  Specimen collection method  Answer:  Lab=Lab collect   11/27/23 1422   11/24/23 0323  Expectorated Sputum Assessment w Gram Stain, Rflx to Resp Cult  (COPD / Pneumonia / Cellulitis / Lower Extremity Wound (Diabetic Foot Infection))  Once,   R        11/24/23 9674             Author: Delon Herald, MD 11/27/2023 2:24 PM  For on call review [email or web address].

## 2023-11-27 NOTE — TOC Progression Note (Signed)
 Transition of Care [facility]) - Progression Note   Patient Details  Name: Mario [name] MRN: [id number] Date of Birth: [birth year]  Transition of Care [facility]) CM/SW Contact  Duwaine GORMAN Aran, LCSW Phone Number: 11/27/2023, 3:02 PM  Clinical Narrative: Patient is currently requiring 10L/min HFNC. Palliative consulted. Care management following for discharge needs.  Barriers to Discharge: Continued Medical Work up  Expected Discharge Plan and Services In-house Referral: NA Discharge Planning Services: NA Living arrangements for the past 2 months: Single Family Home          DME Arranged: N/A DME Agency: NA HH Arranged: NA HH Agency: NA  Social Drivers of Health (SDOH) Interventions SDOH Screenings   Food Insecurity: No Food Insecurity (11/24/2023)  Housing: Low Risk  (11/24/2023)  Transportation Needs: No Transportation Needs (11/24/2023)  Utilities: Not At Risk (11/24/2023)  Financial Resource Strain: Low Risk  (09/23/2020)  Social Connections: Socially Isolated (11/24/2023)  Stress: No Stress Concern Present (09/23/2020)  Tobacco Use: Medium Risk (11/23/2023)   Readmission Risk Interventions    11/27/2023    8:36 AM 05/06/2023    3:25 PM 04/24/2023   10:09 AM  Readmission Risk Prevention Plan  Transportation Screening Complete Complete Complete  Medication Review Oceanographer) Complete Complete Complete  PCP or Specialist appointment within 3-5 days of discharge  Complete   HRI or Home Care Consult Complete Complete Complete  SW Recovery Care/Counseling Consult Complete Complete Complete  Palliative Care Screening Not Applicable Not Applicable Not Applicable  Skilled Nursing Facility Not Applicable Not Applicable Not Applicable

## 2023-11-28 ENCOUNTER — Other Ambulatory Visit (HOSPITAL_COMMUNITY): Payer: Self-pay

## 2023-11-28 DIAGNOSIS — J441 Chronic obstructive pulmonary disease with (acute) exacerbation: Secondary | ICD-10-CM | POA: Diagnosis not present

## 2023-11-28 LAB — CBC WITH DIFFERENTIAL/PLATELET
Abs Immature Granulocytes: 0.12 K/uL — ABNORMAL HIGH (ref 0.00–0.07)
Basophils Absolute: 0.1 K/uL (ref 0.0–0.1)
Basophils Relative: 1 %
Eosinophils Absolute: 0.7 K/uL — ABNORMAL HIGH (ref 0.0–0.5)
Eosinophils Relative: 5 %
HCT: 45.5 % (ref 39.0–52.0)
Hemoglobin: 14.2 g/dL (ref 13.0–17.0)
Immature Granulocytes: 1 %
Lymphocytes Relative: 18 %
Lymphs Abs: 2.8 K/uL (ref 0.7–4.0)
MCH: 28.7 pg (ref 26.0–34.0)
MCHC: 31.2 g/dL (ref 30.0–36.0)
MCV: 91.9 fL (ref 80.0–100.0)
Monocytes Absolute: 1.4 K/uL — ABNORMAL HIGH (ref 0.1–1.0)
Monocytes Relative: 9 %
Neutro Abs: 10.4 K/uL — ABNORMAL HIGH (ref 1.7–7.7)
Neutrophils Relative %: 66 %
Platelets: 402 K/uL — ABNORMAL HIGH (ref 150–400)
RBC: 4.95 MIL/uL (ref 4.22–5.81)
RDW: 15.2 % (ref 11.5–15.5)
WBC: 15.5 K/uL — ABNORMAL HIGH (ref 4.0–10.5)
nRBC: 0 % (ref 0.0–0.2)

## 2023-11-28 LAB — BASIC METABOLIC PANEL WITH GFR
Anion gap: 13 (ref 5–15)
BUN: 21 mg/dL (ref 8–23)
CO2: 25 mmol/L (ref 22–32)
Calcium: 9.1 mg/dL (ref 8.9–10.3)
Chloride: 100 mmol/L (ref 98–111)
Creatinine, Ser: 0.93 mg/dL (ref 0.61–1.24)
GFR, Estimated: >60 mL/min (ref >60)
Glucose, Bld: 117 mg/dL — ABNORMAL HIGH (ref 70–99)
Potassium: 4.6 mmol/L (ref 3.5–5.1)
Sodium: 138 mmol/L (ref 135–145)

## 2023-11-28 LAB — GLUCOSE, CAPILLARY
Glucose-Capillary: 108 mg/dL — ABNORMAL HIGH (ref 70–99)
Glucose-Capillary: 161 mg/dL — ABNORMAL HIGH (ref 70–99)

## 2023-11-28 MED ORDER — DILTIAZEM HCL ER COATED BEADS 120 MG PO CP24
120.0000 mg | ORAL_CAPSULE | Freq: Every day | ORAL | 0 refills | Status: DC
  Filled 2023-11-28: qty 30, 30d supply, fill #0

## 2023-11-28 MED ORDER — GUAIFENESIN ER 600 MG PO TB12: 600.0000 mg | ORAL_TABLET | Freq: Two times a day (BID) | ORAL | 0 refills | Status: DC | PRN

## 2023-11-28 MED ORDER — PREDNISONE 20 MG PO TABS: 40.0000 mg | ORAL_TABLET | Freq: Every day | ORAL | 0 refills | Status: DC

## 2023-11-28 MED ORDER — DILTIAZEM HCL ER COATED BEADS 120 MG PO CP24: 120.0000 mg | ORAL_CAPSULE | Freq: Every day | ORAL | 0 refills | Status: DC

## 2023-11-28 MED ORDER — PREDNISONE 20 MG PO TABS
40.0000 mg | ORAL_TABLET | Freq: Every day | ORAL | 0 refills | Status: DC
  Filled 2023-11-28: qty 4, 2d supply, fill #0

## 2023-11-28 MED ORDER — GUAIFENESIN ER 600 MG PO TB12
600.0000 mg | ORAL_TABLET | Freq: Two times a day (BID) | ORAL | 0 refills | Status: DC | PRN
  Filled 2023-11-28: qty 20, 10d supply, fill #0

## 2023-11-28 NOTE — Discharge Summary (Signed)
 Physician Discharge Summary   Patient: Mario [name] MRN: [id number] DOB: 1950-03-23  Admit date:     11/23/2023  Discharge date: 11/28/23  Discharge Physician: Delon Herald   PCP: [name]   Recommendations at discharge:   You are being discharged with home health physical and occupational therapy Follow up with PCP at the [facility] Follow up with pulmonology at the [facility] Continue to work with the [place] on new housing (ideally Assisted Living) Complete prednisone  - take for 2 more days starting 10/1   Discharge Diagnoses: Principal Problem:   COPD exacerbation (HCC) Active Problems:   Non-small cell cancer of 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   Hypertension associated with diabetes (HCC)   DM2 (diabetes mellitus, type 2) (HCC)   Stage 3a chronic kidney disease (HCC)   Major depressive disorder, recurrent episode   HLA B27 (HLA B27 positive)   Class 2 obesity due to excess calories with body mass index (BMI) of 36.0 to 36.9 in adult   Ankylosing spondylitis lumbar region [facility])    Hospital Course: 72yo with h/o ankylosing spondylitis, COPD on 6-8L home O2, depression with prior suicide attempt, PE, left lung CA s/p radiation, HTN, and OSA who presented on 9/25 with SOB.  He was diagnosed with COPD exacerbation and initially required BIPAP but was able to transition to HFNC O2.  Started on antibiotics, steroids. Nebs.  Pulmonology and palliative care consulted.  Assessment and Plan:  Acute on chronic respiratory failure associated with a COPD exacerbation Patient's shortness of breath and productive cough are most likely caused by acute COPD exacerbation.  O2 sats as low as 84% Transient BIPAP -> HFNC O2 He has history of O2-dependent COPD and is generally wearing 8L at home He has been noncompliant with pulmonology f/u appointments because he gets too winded trying to walk to the [place] outside to take him there and so doesn't go He does not have fever and has  mild leukocytosis Chest x-ray is not consistent with pneumonia but lists concern for multifocal semi-invasive peripheral adenocarcinoma Admitted to stepdown -> progressive Nebulizers: scheduled Duoneb and prn albuterol  Solu-Medrol   -> Prednisone   IV Azithromycin /Cefepime , completed antibiotics Budesonide  nebs for Asmanex  Hold tiotropium-olodaterol Coordinated care with [facility] team/PT/OT/RT consults Pulmonology consulted He appears to be improving/stabilizing, although  he is tenuous even at baseline Added IS, flutter valve Feeling better, will dc to home today   H/o Left lung CA S/p radiation Current CXR read with concern for multifocal semi-invasive peripheral adenocarcinoma Pulmonology consulted   Ankylosing spondylitis Resume home pain control regimen of gabapentin , cyclobenzaprine , Norco   Depression with prior suicide attempt Continue home bupropion , buspirone , duloxetine , oxcarbazepine , trazodone    HTN Resume diltiazem    BPH Continue tamsulosin    DM Recent A1c 6.7, good control Resume Glucophage  Carb modified diet    OSA Wears CPAP qhs   Stage 3a CKD Appears to be stable at this time Attempt to avoid nephrotoxic medications   Obesity Body mass index is 36.17 kg/m.SABRA  Weight loss should be encouraged Outpatient PCP/bariatric medicine f/u encouraged Significantly low or high BMI is associated with higher medical risk including morbidity and mortality    Nutrition Status: Nutrition Problem: Increased nutrient needs Etiology: chronic illness Signs/Symptoms: estimated needs Interventions: Ensure Enlive (each supplement provides 350kcal and 20 grams of protein), MVI   DNR I have discussed code status with the patient and he would not desire resuscitation and would prefer to die a natural death should that situation arise.  [facility] documents reviewed He reports having had a palliative visit at home previously, likely a reasonable palliative vs. Hospice  candidate Palliative care consulted here, he reports that he wants to continue to fight and is not interested in home palliative/hospice at this time         Consultants: Pulmonology Palliative care PT OT RT [facility] team   Procedures: None   Antibiotics: Azithromycin  9/26-29 Cefepime  9/26-30      Pain control - [place]  Controlled Substance Reporting System database was reviewed. and patient was instructed, not to drive, operate heavy machinery, perform activities at heights, swimming or participation in water activities or provide baby-sitting services while on Pain, Sleep and Anxiety Medications; until their outpatient Physician has advised to do so again. Also recommended to not to take more than prescribed Pain, Sleep and Anxiety Medications.   Disposition: Home Diet recommendation:  Carb modified diet DISCHARGE MEDICATION: Allergies as of 11/28/2023       Reactions   Demerol [meperidine] Nausea And Vomiting, Other (See Comments)   Made the patient violently sick   Zocor [simvastatin] Nausea And Vomiting, Other (See Comments)   Made patient feel very jittery, also   Beet [beta Vulgaris] Nausea And Vomiting   Liver Nausea And Vomiting, Other (See Comments)   ANY Liver!!        Medication List     TAKE these medications    acetaminophen  500 MG tablet Commonly known as: TYLENOL  Take 1,000 mg by mouth every 6 (six) hours as needed for mild pain (pain score 1-3), moderate pain (pain score 4-6) or headache.   albuterol  (2.5 MG/3ML) 0.083% nebulizer solution Commonly known as: PROVENTIL  Take 2.5 mg by nebulization every 6 (six) hours as needed for wheezing or shortness of breath.   albuterol  108 (90 Base) MCG/ACT inhaler Commonly known as: VENTOLIN  HFA Inhale 2 puffs into the lungs every 4 (four) hours as needed for wheezing or shortness of breath.   Asmanex  HFA 200 MCG/ACT Aero Generic drug: Mometasone  Furoate Inhale 2 puffs into the lungs daily as  needed (for severe COPD symptoms (when tolerated) and RINSE MOUTH VERY WELL WITH WATER AFTERWARDS).   aspirin  EC 81 MG tablet Take 81 mg by mouth daily. Swallow whole.   azelastine  0.1 % nasal spray Commonly known as: ASTELIN  Place 1 spray into both nostrils 2 (two) times daily as needed for allergies or rhinitis (or post-nasal drip).   bisacodyl  5 MG EC tablet Commonly known as: DULCOLAX Take 2 tablets (10 mg total) by mouth daily as needed for moderate constipation.   BLACK CURRANT SEED OIL PO Take 2 capsules by mouth in the morning.   buPROPion  300 MG 24 hr tablet Commonly known as: WELLBUTRIN  XL Take 300 mg by mouth every morning.   busPIRone  15 MG tablet Commonly known as: BUSPAR  Take 15 mg by mouth in the morning.   CALCIUM  500 PO Take 500 mg by mouth in the morning.   Carboxymethylcellulose Sod PF 0.5 % Soln Place 1 drop into both eyes 4 (four) times daily as needed (dry eyes).   cholecalciferol  25 MCG (1000 UNIT) tablet Commonly known as: VITAMIN D3 Take 1,000 Units by mouth daily.   cyclobenzaprine  10 MG tablet Commonly known as: FLEXERIL  Take 20 mg by mouth at bedtime.   cycloSPORINE  0.05 % ophthalmic emulsion Commonly known as: RESTASIS  Place 1 drop into both eyes every 12 (twelve) hours.   diltiazem  120 MG 24 hr capsule Commonly known as: CARDIZEM  CD Take 1 capsule (  120 mg total) by mouth daily.   docusate sodium  100 MG capsule Commonly known as: COLACE Take 200 mg by mouth 2 (two) times daily.   DULoxetine  60 MG capsule Commonly known as: CYMBALTA  Take 60 mg by mouth 2 (two) times daily.   ferrous sulfate  325 (65 FE) MG tablet Take 325 mg by mouth daily with breakfast.   fluticasone  50 MCG/ACT nasal spray Commonly known as: FLONASE  Place 2 sprays into both nostrils in the morning.   folic acid  1 MG tablet Commonly known as: FOLVITE  Take 1 mg by mouth daily.   gabapentin  100 MG capsule Commonly known as: NEURONTIN  Take 100 mg by mouth 3  (three) times daily.   GARLIC OIL PO Take 1 capsule by mouth in the morning.   Glucerna Liqd Take 237 mLs by mouth See admin instructions. Glucerna Therapeutic Shake chocolate- Drink 1 bottle by mouth once a day   guaiFENesin  600 MG 12 hr tablet Commonly known as: MUCINEX  Take 1 tablet (600 mg total) by mouth 2 (two) times daily as needed for cough or to loosen phlegm.   HYDROcodone -acetaminophen  10-325 MG tablet Commonly known as: NORCO Take 1 tablet by mouth every 6 (six) hours as needed for moderate pain (pain score 4-6).   KRILL OIL PO Take 2 capsules by mouth daily.   lactulose  10 GM/15ML solution Commonly known as: CHRONULAC  Take 20 g by mouth 2 (two) times daily as needed for mild constipation.   lidocaine  5 % ointment Commonly known as: XYLOCAINE  Apply 1 Application topically 2 (two) times daily as needed (for pain- affected sites).   MAG-OXIDE PO Take 1 tablet by mouth daily.   magnesium  citrate Soln Take 0.5 Bottles by mouth daily as needed for severe constipation.   Melatonin 3 MG Caps Take 6 mg by mouth at bedtime.   metFORMIN  850 MG tablet Commonly known as: GLUCOPHAGE  Take 850 mg by mouth daily after breakfast.   multivitamin tablet Take 1 tablet by mouth daily with breakfast.   NON FORMULARY Place 1 application  into both nostrils See admin instructions. InstaCure's Original Nose Balm with Manuka Honey- Place into the affected nasal passage twice a day as needed for irritation   NON FORMULARY Take 2-4 tablets by mouth See admin instructions. ZzzQuil PURE Zzzs Melatonin Gummies- Chew 2 to 4 gummies by mouth at bedtime   omeprazole  20 MG capsule Commonly known as: PRILOSEC Take 1 capsule (20 mg total) by mouth daily. What changed:  when to take this reasons to take this   ondansetron  4 MG disintegrating tablet Commonly known as: ZOFRAN -ODT Take 1 tablet (4 mg total) by mouth every 8 (eight) hours as needed for nausea or vomiting. What changed:  reasons to take this   Oxcarbazepine  300 MG tablet Commonly known as: TRILEPTAL  Take 600 mg by mouth in the morning, at noon, and at bedtime.   OXYGEN  Inhale 6 L/min into the lungs continuous.   polyethylene glycol powder 17 GM/SCOOP powder Commonly known as: GLYCOLAX /MIRALAX  Take 17 g by mouth in the morning.   predniSONE  20 MG tablet Commonly known as: DELTASONE  Take 2 tablets (40 mg total) by mouth daily with breakfast for 2 days. Start taking on: November 29, 2023   senna 8.6 MG Tabs tablet Commonly known as: SENOKOT Take 2 tablets (17.2 mg total) by mouth 2 (two) times daily. What changed:  how much to take when to take this   sodium chloride  0.65 % Soln nasal spray Commonly known as: Nutritional therapist  1 spray into both nostrils daily at 2 am. What changed:  when to take this reasons to take this   tamsulosin  0.4 MG Caps capsule Commonly known as: FLOMAX  Take 0.8 mg by mouth in the morning.   Tiotropium Bromide -Olodaterol 2.5-2.5 MCG/ACT Aers Inhale 2 puffs into the lungs every morning. 2 puffs   traZODone  50 MG tablet Commonly known as: DESYREL  Take 75 mg by mouth at bedtime.   Turmeric Curcumin Caps Take 3 capsules by mouth See admin instructions. Turmeric/Bioperine/Ginger/Garlic- Take 3 capsules by mouth once a day   VITAMIN B12 PO Take 1 tablet by mouth in the morning.        Discharge Exam:   Subjective: Feels better today.  Sitting up at the bedside.  Wants to go home today, celebrate his birthday tomorrow.   Objective: Vitals:   11/28/23 0522 11/28/23 0925  BP: 119/66   Pulse: 89   Resp: 16   Temp: 98.4 F (36.9 C)   SpO2: 97% 98%    Intake/Output Summary (Last 24 hours) at 11/28/2023 0956 Last data filed at 11/28/2023 0900 Gross per 24 hour  Intake 1480 ml  Output 4025 ml  Net -2545 ml   Filed Weights   11/23/23 2308 11/24/23 0445  Weight: 127 kg 127.8 kg    Exam:  General:  Appears calm and comfortable and is in NAD, comfortable  on [place] O2 Eyes:   EOMI, normal lids, iris ENT:  grossly normal hearing, lips & tongue, mmm Cardiovascular:  RR with mild tachycardia. No LE edema.  Respiratory:   Improved air movement with some wheezing.  Normal respiratory effort. 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  Pertinent labs for today include:   Glucose 117 WBC 15.5, improving Platelets 402    Condition at discharge: improving  The results of significant diagnostics from this hospitalization (including imaging, microbiology, ancillary and laboratory) are listed below for reference.   Imaging Studies: DG Chest Port 1 View Result Date: 11/23/2023 EXAM: 1 VIEW(S) XRAY OF THE CHEST 11/23/2023 11:24:06 PM COMPARISON: CTA chest dated 07/20/2023. CLINICAL HISTORY: shortness of breath. Per chart - c/o SOB for the past 3-4 days. Per EMS, pts initial oxygen  saturation was 88% on his baseline 8L [place]. Pt was given one Duoneb treatment with some relief. On arrival, labored breathing noted with retractions and pt is unable to speak in full ; sentences with out taking a breath. Pt denies any recent sick contacts, chest pain, fevers, chills, n/v/d. Dr. Cottie at bedside and respiratory notified FINDINGS: LUNGS AND PLEURA: Stable multifocal opacities are present in the left mid/lower lung and right lower lung. While this has previously been considered suspicious for pneumonia, given the chronic appearance, multifocal semi-invasive peripheral adenocarcinoma (formally, bronchoalveolar cell carcinoma) should be considered. Associated small bilateral pleural  effusions, chronic. No pulmonary edema. No pneumothorax. HEART AND MEDIASTINUM: No acute abnormality of the cardiac and mediastinal silhouettes. BONES AND SOFT TISSUES: No acute osseous abnormality. IMPRESSION: 1. Stable multifocal opacities in the left mid/lower lung and right lower lung. Given chronic appearance, multifocal semi-invasive peripheral adenocarcinoma should be considered. 2. Small bilateral pleural effusions, chronic. Electronically  signed by: Pinkie Pebbles MD 11/23/2023 11:29 PM EDT RP Workstation: [id number]    Microbiology: Results for orders placed or performed during the hospital encounter of 11/23/23  Resp panel by RT-PCR (RSV, Flu A&B, Covid) Anterior Nasal Swab     Status: None   Collection Time: 11/23/23 11:06 PM   Specimen: Anterior Nasal Swab  Result Value Ref Range Status   SARS Coronavirus 2 by RT PCR NEGATIVE NEGATIVE Final    Comment: (NOTE) SARS-CoV-2 target nucleic acids are NOT DETECTED.  The SARS-CoV-2 RNA is generally detectable in upper respiratory specimens during the acute phase of infection. The lowest concentration of SARS-CoV-2 viral copies this assay can detect is 138 copies/mL. A negative result does not preclude SARS-Cov-2 infection and should not be used as the sole basis for treatment or other patient management decisions. A negative result may occur with  improper specimen collection/handling, submission of specimen other than nasopharyngeal swab, presence of viral mutation(s) within the areas targeted by this assay, and inadequate number of viral copies(<138 copies/mL). A negative result must be combined with clinical observations, patient history, and epidemiological information. The expected result is Negative.  Fact Sheet for Patients:  BloggerCourse.com  Fact Sheet for Healthcare Providers:  SeriousBroker.it  This test is no t yet approved or cleared by the [place]  FDA and   has been authorized for detection and/or diagnosis of SARS-CoV-2 by FDA under an Emergency Use Authorization (EUA). This EUA will remain  in effect (meaning this test can be used) for the duration of the COVID-19 declaration under Section 564(b)(1) of the Act, 21 U.S.C.section 360bbb-3(b)(1), unless the authorization is terminated  or revoked sooner.       Influenza A by PCR NEGATIVE NEGATIVE Final   Influenza B by PCR NEGATIVE NEGATIVE Final    Comment: (NOTE) The Xpert Xpress SARS-CoV-2/FLU/RSV plus assay is intended as an aid in the diagnosis of influenza from Nasopharyngeal swab specimens and should not be used as a sole basis for treatment. Nasal washings and aspirates are unacceptable for Xpert Xpress SARS-CoV-2/FLU/RSV testing.  Fact Sheet for Patients: BloggerCourse.com  Fact Sheet for Healthcare Providers: SeriousBroker.it  This test is not yet approved or cleared by the [place]  FDA and has been authorized for detection and/or diagnosis of SARS-CoV-2 by FDA under an Emergency Use Authorization (EUA). This EUA will remain in effect (meaning this test can be used) for the duration of the COVID-19 declaration under Section 564(b)(1) of the Act, 21 U.S.C. section 360bbb-3(b)(1), unless the authorization is terminated or revoked.     Resp Syncytial Virus by PCR NEGATIVE NEGATIVE Final    Comment: (NOTE) Fact Sheet for Patients: BloggerCourse.com  Fact Sheet for Healthcare Providers: SeriousBroker.it  This test is not yet approved or cleared by the [place]  FDA and has been authorized for detection and/or diagnosis of SARS-CoV-2 by FDA under an Emergency Use Authorization (EUA). This EUA will remain in effect (meaning this test can be used) for the duration of the COVID-19 declaration under Section 564(b)(1) of the Act, 21 U.S.C. section 360bbb-3(b)(1),  unless the authorization is terminated or revoked.  Performed at [facility], [address] [place], [place] [address]   MRSA Next Gen by PCR, Nasal     Status: Abnormal   Collection Time: 11/24/23  4:29 AM   Specimen: Nasal Mucosa; Nasal Swab  Result Value Ref Range Status   MRSA by PCR Next Gen DETECTED (A) NOT DETECTED Final    Comment: RESULT CALLED  TO, READ BACK BY AND VERIFIED WITH: CONTRERAS, F. 0907 11/24/23 BY JE (NOTE) The GeneXpert MRSA Assay (FDA approved for NASAL specimens only), is one component of a comprehensive MRSA colonization surveillance program. It is not intended to diagnose MRSA infection nor to guide or monitor treatment for MRSA infections. Test performance is not FDA approved in patients less than 11 years old. Performed at [facility], [address], [place], [place] [address]   Respiratory (~20 pathogens) panel by PCR     Status: None   Collection Time: 11/24/23 12:16 PM   Specimen: Nasopharyngeal Swab; Respiratory  Result Value Ref Range Status   Adenovirus NOT DETECTED NOT DETECTED Final   Coronavirus 229E NOT DETECTED NOT DETECTED Final    Comment: (NOTE) The Coronavirus on the Respiratory Panel, DOES NOT test for the novel  Coronavirus (2019 nCoV)    Coronavirus HKU1 NOT DETECTED NOT DETECTED Final   Coronavirus NL63 NOT DETECTED NOT DETECTED Final   Coronavirus OC43 NOT DETECTED NOT DETECTED Final   Metapneumovirus NOT DETECTED NOT DETECTED Final   Rhinovirus / Enterovirus NOT DETECTED NOT DETECTED Final   Influenza A NOT DETECTED NOT DETECTED Final   Influenza B NOT DETECTED NOT DETECTED Final   Parainfluenza Virus 1 NOT DETECTED NOT DETECTED Final   Parainfluenza Virus 2 NOT DETECTED NOT DETECTED Final   Parainfluenza Virus 3 NOT DETECTED NOT DETECTED Final   Parainfluenza Virus 4 NOT DETECTED NOT DETECTED Final   Respiratory Syncytial Virus NOT DETECTED NOT DETECTED Final   Bordetella pertussis  NOT DETECTED NOT DETECTED Final   Bordetella Parapertussis NOT DETECTED NOT DETECTED Final   Chlamydophila pneumoniae NOT DETECTED NOT DETECTED Final   Mycoplasma pneumoniae NOT DETECTED NOT DETECTED Final    Comment: Performed at [facility], [address], [place], [place] [address]    Labs: CBC: Recent Labs  Lab 11/23/23 2315 11/24/23 0506 11/25/23 0318 11/26/23 0312 11/28/23 0558  WBC 12.2* 13.2* 19.4* 16.8* 15.5*  NEUTROABS 8.9*  --   --  11.4* 10.4*  HGB 13.6 13.6 13.5 13.2 14.2  HCT 42.4 43.2 41.8 42.7 45.5  MCV 92.0 91.5 91.9 93.4 91.9  PLT 414* 397 382 377 402*   Basic Metabolic Panel: Recent Labs  Lab 11/23/23 2315 11/24/23 0506 11/25/23 0318 11/26/23 0312 11/28/23 0558  NA 138 135 137 136 138  K 4.3 4.6 4.6 3.8 4.6  CL 100 100 100 101 100  CO2 25 22 25 25 25   GLUCOSE 147* 225* 167* 117* 117*  BUN 12 16 18 19 21   CREATININE 1.01 1.02 1.06 0.90 0.93  CALCIUM  9.3 9.3 9.3 8.9 9.1   Liver Function Tests: Recent Labs  Lab 11/24/23 0506  AST 21  ALT 12  ALKPHOS 113  BILITOT 0.2  PROT 7.1  ALBUMIN 3.9   CBG: Recent Labs  Lab 11/27/23 0739 11/27/23 1130 11/27/23 1622 11/27/23 2013 11/28/23 0742  GLUCAP 141* 166* 184* 186* 108*    Discharge time spent: greater than 30 minutes.  Signed: Delon Herald, MD [facility] 11/28/2023

## 2023-11-28 NOTE — TOC Transition Note (Signed)
 Transition of Care [facility]) - Discharge Note  Patient Details  Name: Mario [name] MRN: [id number] Date of Birth: Sep 21, 1950  Transition of Care [facility]) CM/SW Contact:  Duwaine GORMAN Aran, LCSW Phone Number: 11/28/2023, 11:59 AM  Clinical Narrative: PT/OT recommended HH. Patient agreeable to [facility] and requested [facility]. CSW made [facility] referral for PT/OT/RN/aide to [facility] with [facility], which was accepted. HH orders placed by hospitalist. Care management signing off.  Final next level of care: Home w Home Health Services Barriers to Discharge: Barriers Resolved  Patient Goals and CMS Choice Patient states their goals for this hospitalization and ongoing recovery are:: Get HH through [facility] CMS Medicare.gov Compare Post Acute Care list provided to:: Patient Choice offered to / list presented to : Patient  Discharge Plan and Services Additional resources added to the After Visit Summary for   In-house Referral: NA Discharge Planning Services: NA    DME Arranged: N/A DME Agency: NA HH Arranged: RN, PT, OT, Nurse's Aide HH Agency: [facility] Date [facility] Agency Contacted: 11/28/23 Time HH Agency Contacted: 1021 Representative spoke with at [facility] Agency: [place]  Social Drivers of Health (SDOH) Interventions SDOH Screenings   Food Insecurity: No Food Insecurity (11/24/2023)  Housing: Low Risk  (11/24/2023)  Transportation Needs: No Transportation Needs (11/24/2023)  Utilities: Not At Risk (11/24/2023)  Financial Resource Strain: Low Risk  (09/23/2020)  Social Connections: Socially Isolated (11/24/2023)  Stress: No Stress Concern Present (09/23/2020)  Tobacco Use: Medium Risk (11/23/2023)   Readmission Risk Interventions    11/27/2023    8:36 AM 05/06/2023    3:25 PM 04/24/2023   10:09 AM  Readmission Risk Prevention Plan  Transportation Screening Complete Complete Complete  Medication Review Oceanographer) Complete Complete Complete  PCP or Specialist appointment within 3-5 days of  discharge  Complete   HRI or Home Care Consult Complete Complete Complete  SW Recovery Care/Counseling Consult Complete Complete Complete  Palliative Care Screening Not Applicable Not Applicable Not Applicable  Skilled Nursing Facility Not Applicable Not Applicable Not Applicable

## 2023-11-28 NOTE — Progress Notes (Signed)
 Discharge medications delivered to patinent at bedside in a secure bag.

## 2023-11-30 ENCOUNTER — Inpatient Hospital Stay (HOSPITAL_COMMUNITY)
Admission: EM | Admit: 2023-11-30 | Discharge: 2023-12-07 | DRG: 180 | Disposition: A | Discharge status: Home Health | Attending: Family Medicine | Admitting: Family Medicine

## 2023-11-30 ENCOUNTER — Emergency Department (HOSPITAL_COMMUNITY)

## 2023-11-30 ENCOUNTER — Encounter (HOSPITAL_COMMUNITY): Payer: Self-pay | Admitting: Internal Medicine

## 2023-11-30 ENCOUNTER — Other Ambulatory Visit: Payer: Self-pay

## 2023-11-30 DIAGNOSIS — N1831 Chronic kidney disease, stage 3a: Secondary | ICD-10-CM | POA: Diagnosis present

## 2023-11-30 DIAGNOSIS — E785 Hyperlipidemia, unspecified: Secondary | ICD-10-CM | POA: Diagnosis present

## 2023-11-30 DIAGNOSIS — J432 Centrilobular emphysema: Secondary | ICD-10-CM | POA: Diagnosis present

## 2023-11-30 DIAGNOSIS — J9621 Acute and chronic respiratory failure with hypoxia: Secondary | ICD-10-CM | POA: Diagnosis present

## 2023-11-30 DIAGNOSIS — C3491 Malignant neoplasm of unspecified part of right bronchus or lung: Secondary | ICD-10-CM | POA: Diagnosis present

## 2023-11-30 DIAGNOSIS — D75839 Thrombocytosis, unspecified: Secondary | ICD-10-CM | POA: Diagnosis present

## 2023-11-30 DIAGNOSIS — J440 Chronic obstructive pulmonary disease with acute lower respiratory infection: Secondary | ICD-10-CM | POA: Diagnosis present

## 2023-11-30 DIAGNOSIS — Z85828 Personal history of other malignant neoplasm of skin: Secondary | ICD-10-CM

## 2023-11-30 DIAGNOSIS — Z1152 Encounter for screening for COVID-19: Secondary | ICD-10-CM

## 2023-11-30 DIAGNOSIS — J441 Chronic obstructive pulmonary disease with (acute) exacerbation: Secondary | ICD-10-CM | POA: Diagnosis present

## 2023-11-30 DIAGNOSIS — J700 Acute pulmonary manifestations due to radiation: Secondary | ICD-10-CM | POA: Diagnosis present

## 2023-11-30 DIAGNOSIS — C3490 Malignant neoplasm of unspecified part of unspecified bronchus or lung: Secondary | ICD-10-CM

## 2023-11-30 DIAGNOSIS — Z9981 Dependence on supplemental oxygen: Secondary | ICD-10-CM

## 2023-11-30 DIAGNOSIS — E1122 Type 2 diabetes mellitus with diabetic chronic kidney disease: Secondary | ICD-10-CM | POA: Diagnosis present

## 2023-11-30 DIAGNOSIS — Z515 Encounter for palliative care: Secondary | ICD-10-CM

## 2023-11-30 DIAGNOSIS — Z608 Other problems related to social environment: Secondary | ICD-10-CM | POA: Diagnosis present

## 2023-11-30 DIAGNOSIS — Y842 Radiological procedure and radiotherapy as the cause of abnormal reaction of the patient, or of later complication, without mention of misadventure at the time of the procedure: Secondary | ICD-10-CM | POA: Diagnosis present

## 2023-11-30 DIAGNOSIS — N400 Enlarged prostate without lower urinary tract symptoms: Secondary | ICD-10-CM | POA: Diagnosis present

## 2023-11-30 DIAGNOSIS — Z66 Do not resuscitate: Secondary | ICD-10-CM | POA: Diagnosis present

## 2023-11-30 DIAGNOSIS — F419 Anxiety disorder, unspecified: Secondary | ICD-10-CM | POA: Diagnosis present

## 2023-11-30 DIAGNOSIS — E1165 Type 2 diabetes mellitus with hyperglycemia: Secondary | ICD-10-CM | POA: Diagnosis present

## 2023-11-30 DIAGNOSIS — C799 Secondary malignant neoplasm of unspecified site: Secondary | ICD-10-CM | POA: Diagnosis present

## 2023-11-30 DIAGNOSIS — J189 Pneumonia, unspecified organism: Secondary | ICD-10-CM | POA: Diagnosis present

## 2023-11-30 DIAGNOSIS — Y95 Nosocomial condition: Secondary | ICD-10-CM | POA: Diagnosis present

## 2023-11-30 DIAGNOSIS — Z7189 Other specified counseling: Secondary | ICD-10-CM

## 2023-11-30 DIAGNOSIS — Z87891 Personal history of nicotine dependence: Secondary | ICD-10-CM

## 2023-11-30 DIAGNOSIS — F32A Depression, unspecified: Secondary | ICD-10-CM | POA: Diagnosis present

## 2023-11-30 DIAGNOSIS — M456 Ankylosing spondylitis lumbar region: Secondary | ICD-10-CM | POA: Diagnosis present

## 2023-11-30 DIAGNOSIS — J910 Malignant pleural effusion: Secondary | ICD-10-CM | POA: Diagnosis present

## 2023-11-30 DIAGNOSIS — Z91148 Patient's other noncompliance with medication regimen for other reason: Secondary | ICD-10-CM

## 2023-11-30 DIAGNOSIS — J188 Other pneumonia, unspecified organism: Secondary | ICD-10-CM | POA: Diagnosis not present

## 2023-11-30 DIAGNOSIS — I2489 Other forms of acute ischemic heart disease: Secondary | ICD-10-CM | POA: Diagnosis present

## 2023-11-30 DIAGNOSIS — Z7984 Long term (current) use of oral hypoglycemic drugs: Secondary | ICD-10-CM

## 2023-11-30 DIAGNOSIS — R042 Hemoptysis: Secondary | ICD-10-CM | POA: Diagnosis present

## 2023-11-30 DIAGNOSIS — Z79899 Other long term (current) drug therapy: Secondary | ICD-10-CM

## 2023-11-30 DIAGNOSIS — Z923 Personal history of irradiation: Secondary | ICD-10-CM

## 2023-11-30 DIAGNOSIS — I129 Hypertensive chronic kidney disease with stage 1 through stage 4 chronic kidney disease, or unspecified chronic kidney disease: Secondary | ICD-10-CM | POA: Diagnosis present

## 2023-11-30 DIAGNOSIS — I252 Old myocardial infarction: Secondary | ICD-10-CM

## 2023-11-30 DIAGNOSIS — Z604 Social exclusion and rejection: Secondary | ICD-10-CM | POA: Diagnosis present

## 2023-11-30 DIAGNOSIS — Z6836 Body mass index (BMI) 36.0-36.9, adult: Secondary | ICD-10-CM

## 2023-11-30 DIAGNOSIS — Z85118 Personal history of other malignant neoplasm of bronchus and lung: Secondary | ICD-10-CM

## 2023-11-30 DIAGNOSIS — Z5982 Transportation insecurity: Secondary | ICD-10-CM

## 2023-11-30 DIAGNOSIS — Z9151 Personal history of suicidal behavior: Secondary | ICD-10-CM

## 2023-11-30 DIAGNOSIS — Z86711 Personal history of pulmonary embolism: Secondary | ICD-10-CM

## 2023-11-30 DIAGNOSIS — Z7982 Long term (current) use of aspirin: Secondary | ICD-10-CM

## 2023-11-30 LAB — CBC WITH DIFFERENTIAL/PLATELET
Abs Immature Granulocytes: 0.12 K/uL — ABNORMAL HIGH (ref 0.00–0.07)
Basophils Absolute: 0.1 K/uL (ref 0.0–0.1)
Basophils Relative: 1 %
Eosinophils Absolute: 0.5 K/uL (ref 0.0–0.5)
Eosinophils Relative: 3 %
HCT: 49.2 % (ref 39.0–52.0)
Hemoglobin: 15.6 g/dL (ref 13.0–17.0)
Immature Granulocytes: 1 %
Lymphocytes Relative: 13 %
Lymphs Abs: 2.1 K/uL (ref 0.7–4.0)
MCH: 29.1 pg (ref 26.0–34.0)
MCHC: 31.7 g/dL (ref 30.0–36.0)
MCV: 91.8 fL (ref 80.0–100.0)
Monocytes Absolute: 1.5 K/uL — ABNORMAL HIGH (ref 0.1–1.0)
Monocytes Relative: 9 %
Neutro Abs: 12.1 K/uL — ABNORMAL HIGH (ref 1.7–7.7)
Neutrophils Relative %: 73 %
Platelets: 431 K/uL — ABNORMAL HIGH (ref 150–400)
RBC: 5.36 MIL/uL (ref 4.22–5.81)
RDW: 15.4 % (ref 11.5–15.5)
WBC: 16.5 K/uL — ABNORMAL HIGH (ref 4.0–10.5)
nRBC: 0 % (ref 0.0–0.2)

## 2023-11-30 LAB — CBC
HCT: 44 % (ref 39.0–52.0)
Hemoglobin: 14 g/dL (ref 13.0–17.0)
MCH: 29 pg (ref 26.0–34.0)
MCHC: 31.8 g/dL (ref 30.0–36.0)
MCV: 91.1 fL (ref 80.0–100.0)
Platelets: 384 K/uL (ref 150–400)
RBC: 4.83 MIL/uL (ref 4.22–5.81)
RDW: 15.3 % (ref 11.5–15.5)
WBC: 13.3 K/uL — ABNORMAL HIGH (ref 4.0–10.5)
nRBC: 0 % (ref 0.0–0.2)

## 2023-11-30 LAB — COMPREHENSIVE METABOLIC PANEL WITH GFR
ALT: 29 U/L (ref 0–44)
AST: 22 U/L (ref 15–41)
Albumin: 4.4 g/dL (ref 3.5–5.0)
Alkaline Phosphatase: 111 U/L (ref 38–126)
Anion gap: 14 (ref 5–15)
BUN: 24 mg/dL — ABNORMAL HIGH (ref 8–23)
CO2: 27 mmol/L (ref 22–32)
Calcium: 9.9 mg/dL (ref 8.9–10.3)
Chloride: 98 mmol/L (ref 98–111)
Creatinine, Ser: 1.2 mg/dL (ref 0.61–1.24)
GFR, Estimated: >60 mL/min (ref >60)
Glucose, Bld: 146 mg/dL — ABNORMAL HIGH (ref 70–99)
Potassium: 4.6 mmol/L (ref 3.5–5.1)
Sodium: 139 mmol/L (ref 135–145)
Total Bilirubin: 0.3 mg/dL (ref 0.0–1.2)
Total Protein: 7.7 g/dL (ref 6.5–8.1)

## 2023-11-30 LAB — BLOOD GAS, VENOUS
Acid-Base Excess: 5.2 mmol/L — ABNORMAL HIGH (ref 0.0–2.0)
Bicarbonate: 31.5 mmol/L — ABNORMAL HIGH (ref 20.0–28.0)
O2 Saturation: 46.9 %
Patient temperature: 37
pCO2, Ven: 52 mmHg (ref 44–60)
pH, Ven: 7.39 (ref 7.25–7.43)
pO2, Ven: <31 mmHg — CL (ref 32–45)

## 2023-11-30 LAB — MRSA NEXT GEN BY PCR, NASAL: MRSA by PCR Next Gen: DETECTED — AB

## 2023-11-30 LAB — RESP PANEL BY RT-PCR (RSV, FLU A&B, COVID)  RVPGX2
Influenza A by PCR: NEGATIVE
Influenza B by PCR: NEGATIVE
Resp Syncytial Virus by PCR: NEGATIVE
SARS Coronavirus 2 by RT PCR: NEGATIVE

## 2023-11-30 LAB — PRO BRAIN NATRIURETIC PEPTIDE: Pro Brain Natriuretic Peptide: 127 pg/mL (ref <300.0)

## 2023-11-30 LAB — HEMOGLOBIN A1C
Hgb A1c MFr Bld: 6 % — ABNORMAL HIGH (ref 4.8–5.6)
Mean Plasma Glucose: 125.5 mg/dL

## 2023-11-30 LAB — HIV ANTIBODY (ROUTINE TESTING W REFLEX): HIV Screen 4th Generation wRfx: NONREACTIVE

## 2023-11-30 LAB — CBG MONITORING, ED
Glucose-Capillary: 180 mg/dL — ABNORMAL HIGH (ref 70–99)
Glucose-Capillary: 127 mg/dL — ABNORMAL HIGH (ref 70–99)

## 2023-11-30 LAB — GLUCOSE, CAPILLARY: Glucose-Capillary: 173 mg/dL — ABNORMAL HIGH (ref 70–99)

## 2023-11-30 LAB — STREP PNEUMONIAE URINARY ANTIGEN: Strep Pneumo Urinary Antigen: NEGATIVE

## 2023-11-30 LAB — CREATININE, SERUM
Creatinine, Ser: 0.96 mg/dL (ref 0.61–1.24)
GFR, Estimated: >60 mL/min (ref >60)

## 2023-11-30 LAB — TROPONIN T, HIGH SENSITIVITY
Troponin T High Sensitivity: 28 ng/L — ABNORMAL HIGH (ref 0–19)
Troponin T High Sensitivity: 23 ng/L — ABNORMAL HIGH (ref 0–19)

## 2023-11-30 MED ORDER — OXCARBAZEPINE 300 MG PO TABS
900.0000 mg | ORAL_TABLET | Freq: Two times a day (BID) | ORAL | Status: DC
  Administered 2023-11-30 – 2023-12-07 (×15): 900 mg via ORAL
  Filled 2023-11-30 (×15): qty 3

## 2023-11-30 MED ORDER — BUPROPION HCL ER (XL) 300 MG PO TB24
300.0000 mg | ORAL_TABLET | Freq: Every morning | ORAL | Status: DC
  Administered 2023-11-30 – 2023-12-07 (×8): 300 mg via ORAL
  Filled 2023-11-30: qty 1
  Filled 2023-11-30: qty 2
  Filled 2023-11-30 (×6): qty 1

## 2023-11-30 MED ORDER — VANCOMYCIN HCL 2000 MG/400ML IV SOLN
2000.0000 mg | Freq: Every day | INTRAVENOUS | Status: DC
  Administered 2023-11-30 – 2023-12-01 (×2): 2000 mg via INTRAVENOUS
  Filled 2023-11-30 (×2): qty 400

## 2023-11-30 MED ORDER — CYCLOBENZAPRINE HCL 10 MG PO TABS
20.0000 mg | ORAL_TABLET | Freq: Every day | ORAL | Status: DC
  Administered 2023-11-30 – 2023-12-06 (×7): 20 mg via ORAL
  Filled 2023-11-30 (×7): qty 2

## 2023-11-30 MED ORDER — INSULIN ASPART 100 UNIT/ML IJ SOLN
0.0000 [IU] | Freq: Three times a day (TID) | INTRAMUSCULAR | Status: DC
  Administered 2023-11-30: 2 [IU] via SUBCUTANEOUS
  Administered 2023-11-30 – 2023-12-01 (×2): 3 [IU] via SUBCUTANEOUS
  Administered 2023-12-01 – 2023-12-02 (×2): 2 [IU] via SUBCUTANEOUS
  Administered 2023-12-03: 3 [IU] via SUBCUTANEOUS
  Administered 2023-12-04: 2 [IU] via SUBCUTANEOUS
  Administered 2023-12-04: 3 [IU] via SUBCUTANEOUS
  Administered 2023-12-05: 2 [IU] via SUBCUTANEOUS
  Administered 2023-12-05: 5 [IU] via SUBCUTANEOUS
  Administered 2023-12-06: 2 [IU] via SUBCUTANEOUS
  Administered 2023-12-06 – 2023-12-07 (×3): 3 [IU] via SUBCUTANEOUS
  Filled 2023-11-30: qty 0.15

## 2023-11-30 MED ORDER — HYDROCODONE-ACETAMINOPHEN 10-325 MG PO TABS
1.0000 | ORAL_TABLET | Freq: Four times a day (QID) | ORAL | Status: DC | PRN
Start: 2023-11-30 — End: 2023-12-07
  Administered 2023-12-01 – 2023-12-07 (×11): 1 via ORAL
  Filled 2023-11-30 (×11): qty 1

## 2023-11-30 MED ORDER — DULOXETINE HCL 60 MG PO CPEP
60.0000 mg | ORAL_CAPSULE | Freq: Two times a day (BID) | ORAL | Status: DC
  Administered 2023-11-30 – 2023-12-07 (×15): 60 mg via ORAL
  Filled 2023-11-30 (×2): qty 3
  Filled 2023-11-30: qty 2
  Filled 2023-11-30: qty 1
  Filled 2023-11-30: qty 3
  Filled 2023-11-30 (×2): qty 2
  Filled 2023-11-30: qty 1
  Filled 2023-11-30 (×2): qty 3
  Filled 2023-11-30 (×2): qty 1
  Filled 2023-11-30: qty 2
  Filled 2023-11-30: qty 1
  Filled 2023-11-30 (×2): qty 2
  Filled 2023-11-30: qty 1
  Filled 2023-11-30: qty 3
  Filled 2023-11-30: qty 1
  Filled 2023-11-30: qty 3

## 2023-11-30 MED ORDER — IPRATROPIUM-ALBUTEROL 0.5-2.5 (3) MG/3ML IN SOLN: 3.0000 mL | RESPIRATORY_TRACT | Status: DC | PRN

## 2023-11-30 MED ORDER — TRAZODONE HCL 50 MG PO TABS
75.0000 mg | ORAL_TABLET | Freq: Every day | ORAL | Status: DC
  Administered 2023-11-30 – 2023-12-06 (×7): 75 mg via ORAL
  Filled 2023-11-30 (×8): qty 2

## 2023-11-30 MED ORDER — VANCOMYCIN HCL IN DEXTROSE 1-5 GM/200ML-% IV SOLN
1000.0000 mg | Freq: Once | INTRAVENOUS | Status: DC
Start: 2023-11-30 — End: 2023-11-30

## 2023-11-30 MED ORDER — SODIUM CHLORIDE (PF) 0.9 % IJ SOLN
INTRAMUSCULAR | Status: AC
  Filled 2023-11-30: qty 50

## 2023-11-30 MED ORDER — IPRATROPIUM-ALBUTEROL 0.5-2.5 (3) MG/3ML IN SOLN: 3.0000 mL | Freq: Four times a day (QID) | RESPIRATORY_TRACT | Status: DC | PRN

## 2023-11-30 MED ORDER — ALBUTEROL SULFATE (2.5 MG/3ML) 0.083% IN NEBU
5.0000 mg | INHALATION_SOLUTION | Freq: Once | RESPIRATORY_TRACT | Status: AC
  Administered 2023-11-30: 5 mg via RESPIRATORY_TRACT
  Filled 2023-11-30: qty 6

## 2023-11-30 MED ORDER — ASPIRIN 81 MG PO TBEC
81.0000 mg | DELAYED_RELEASE_TABLET | Freq: Every day | ORAL | Status: DC
  Administered 2023-11-30 – 2023-12-07 (×8): 81 mg via ORAL
  Filled 2023-11-30 (×8): qty 1

## 2023-11-30 MED ORDER — IPRATROPIUM-ALBUTEROL 0.5-2.5 (3) MG/3ML IN SOLN
3.0000 mL | Freq: Three times a day (TID) | RESPIRATORY_TRACT | Status: DC
  Administered 2023-12-01: 3 mL via RESPIRATORY_TRACT
  Filled 2023-11-30: qty 3

## 2023-11-30 MED ORDER — INSULIN ASPART 100 UNIT/ML IJ SOLN
0.0000 [IU] | Freq: Every day | INTRAMUSCULAR | Status: DC
  Administered 2023-12-06: 2 [IU] via SUBCUTANEOUS
  Filled 2023-11-30: qty 0.05

## 2023-11-30 MED ORDER — METHYLPREDNISOLONE SODIUM SUCC 125 MG IJ SOLR
125.0000 mg | Freq: Once | INTRAMUSCULAR | Status: AC
  Administered 2023-11-30: 125 mg via INTRAVENOUS
  Filled 2023-11-30: qty 2

## 2023-11-30 MED ORDER — ENOXAPARIN SODIUM 40 MG/0.4ML IJ SOSY: 40.0000 mg | PREFILLED_SYRINGE | INTRAMUSCULAR | Status: DC

## 2023-11-30 MED ORDER — ACETAMINOPHEN 325 MG PO TABS
650.0000 mg | ORAL_TABLET | Freq: Once | ORAL | Status: AC
  Administered 2023-11-30: 650 mg via ORAL
  Filled 2023-11-30: qty 2

## 2023-11-30 MED ORDER — IOHEXOL 350 MG/ML SOLN
100.0000 mL | Freq: Once | INTRAVENOUS | Status: AC | PRN
  Administered 2023-11-30: 100 mL via INTRAVENOUS

## 2023-11-30 MED ORDER — PANTOPRAZOLE SODIUM 40 MG PO TBEC
40.0000 mg | DELAYED_RELEASE_TABLET | Freq: Every day | ORAL | Status: DC
Start: 2023-11-30 — End: 2023-12-07
  Administered 2023-11-30 – 2023-12-07 (×8): 40 mg via ORAL
  Filled 2023-11-30 (×8): qty 1

## 2023-11-30 MED ORDER — SODIUM CHLORIDE 0.9 % IV SOLN
2.0000 g | Freq: Once | INTRAVENOUS | Status: AC
  Administered 2023-11-30: 2 g via INTRAVENOUS
  Filled 2023-11-30: qty 12.5

## 2023-11-30 MED ORDER — SODIUM CHLORIDE 0.9 % IV SOLN
2.0000 g | Freq: Three times a day (TID) | INTRAVENOUS | Status: DC
  Administered 2023-11-30 – 2023-12-05 (×15): 2 g via INTRAVENOUS
  Filled 2023-11-30 (×15): qty 12.5

## 2023-11-30 MED ORDER — VANCOMYCIN HCL 2000 MG/400ML IV SOLN
2000.0000 mg | Freq: Once | INTRAVENOUS | Status: AC
Start: 2023-11-30 — End: 2023-11-30
  Administered 2023-11-30: 2000 mg via INTRAVENOUS
  Filled 2023-11-30: qty 400

## 2023-11-30 MED ORDER — DILTIAZEM HCL ER COATED BEADS 120 MG PO CP24
120.0000 mg | ORAL_CAPSULE | Freq: Every day | ORAL | Status: DC
  Administered 2023-11-30 – 2023-12-07 (×8): 120 mg via ORAL
  Filled 2023-11-30 (×8): qty 1

## 2023-11-30 MED ORDER — GABAPENTIN 100 MG PO CAPS
100.0000 mg | ORAL_CAPSULE | Freq: Three times a day (TID) | ORAL | Status: DC
  Administered 2023-11-30 – 2023-12-07 (×21): 100 mg via ORAL
  Filled 2023-11-30 (×21): qty 1

## 2023-11-30 MED ORDER — TAMSULOSIN HCL 0.4 MG PO CAPS
0.8000 mg | ORAL_CAPSULE | Freq: Every morning | ORAL | Status: DC
  Administered 2023-11-30 – 2023-12-07 (×8): 0.8 mg via ORAL
  Filled 2023-11-30 (×8): qty 2

## 2023-11-30 MED ORDER — BUSPIRONE HCL 5 MG PO TABS
15.0000 mg | ORAL_TABLET | Freq: Every morning | ORAL | Status: DC
  Administered 2023-11-30 – 2023-12-07 (×8): 15 mg via ORAL
  Filled 2023-11-30 (×7): qty 1
  Filled 2023-11-30: qty 2

## 2023-11-30 MED ORDER — IPRATROPIUM-ALBUTEROL 0.5-2.5 (3) MG/3ML IN SOLN
3.0000 mL | Freq: Four times a day (QID) | RESPIRATORY_TRACT | Status: DC
  Administered 2023-11-30 (×3): 3 mL via RESPIRATORY_TRACT
  Filled 2023-11-30 (×3): qty 3

## 2023-11-30 MED ORDER — ENOXAPARIN SODIUM 60 MG/0.6ML IJ SOSY
60.0000 mg | PREFILLED_SYRINGE | INTRAMUSCULAR | Status: DC
  Administered 2023-11-30 – 2023-12-06 (×7): 60 mg via SUBCUTANEOUS
  Filled 2023-11-30 (×8): qty 0.6

## 2023-11-30 NOTE — H&P (Signed)
 History and Physical    Mario [name] FMW:[id number] DOB: 1950/07/18 DOA: 11/30/2023  PCP: [name]  Patient coming from: Home  I have personally briefly reviewed patient's old medical records in [facility]  Chief Complaint: Worsening shortness of breath  HPI: Mario [name] is a 73 y.o. male with medical history significant of ankylosing spondylitis, chronic hypoxemic respiratory failure in the setting of COPD on 8 L of oxygen  via nasal cannulae at home, depression with prior history of suicide attempt, left lung cancer s/p radiation therapy, hypertension, OSA, obesity, type 2 diabetes, CKD stage III A presented with worsening shortness of breath  Patient recently discharged on 9/30.  Admitted for COPD exacerbation.  Reports that he was not back to his baseline prior to his discharge however he wanted to celebrate his birthday and wanted to go home.  Since he got home his symptoms started getting worse.  He was unable to walk due to worsening breathing and oxygen  saturation kept going down.  He did not take prednisone  as he supposed to yesterday.  He continued to have worsening shortness of breath and wheezing therefore he called EMS and came to ER for further evaluation and management.  Reports chronic cough which is associated with clear sputum.  No fever, chills, chest pain, headache, blurry vision, abdominal pain, lightheadedness, dizziness.  He lives alone.  Independent on daily life activities.  Reports that he does not smoke, drink or use any illicit drugs.  ED Course: Upon arrival to ED: Patient tachycardic, tachypneic, requiring 10 L of oxygen  via nasal cannula, BP 116/105.  CBC shows leukocytosis of 16.5, PLT 431, H&H 15.6/49.2.  Troponin 28 trended down to 23.  Blood gas: pO2 31, bicarb 31.5, pCO2 52.  NA: 139, K: 4.6, normal kidney and liver enzyme.  proBNP: WNL.  CT angio shows no PE however positive for extensive multifocal pneumonia involving the lower  lobes bilaterally.  Lingula in the right middle lobe.  Moderate to severe centrilobular emphysema.  Patient was given broad-spectrum antibiotics.  [facility]ist consulted for admission.  Review of Systems: As per HPI otherwise negative.    Past Medical History:  Diagnosis Date   Ankylosing spondylitis lumbar region [facility]) 09/25/2022   Anxiety    Bronchitis    COPD (chronic obstructive pulmonary disease) (HCC)    Depression    History of falling 07/29/2021   history of Pulmonary embolism (HCC) 07/02/2022   History of radiation therapy    Left lung- 10/05/20-10/15/20- Dr. Lynwood Nasuti   Hypertension    lung ca 09/2020   MI (myocardial infarction) [facility])    ????   On home oxygen  therapy    4L/min [place]   OSA (obstructive sleep apnea)    Suicidal ideation 06/23/2019   Suicide attempt (HCC)    Tension pneumothorax 06/27/2016   Uveitis     Past Surgical History:  Procedure Laterality Date   BIOPSY  07/03/2021   Procedure: BIOPSY;  Surgeon: Elicia Claw, MD;  Location: [facility] ENDOSCOPY;  Service: Gastroenterology;;   BRONCHIAL BIOPSY  07/30/2020   Procedure: BRONCHIAL BIOPSIES;  Surgeon: Brenna Adine CROME, DO;  Location: [facility] ENDOSCOPY;  Service: Pulmonary;;   BRONCHIAL BRUSHINGS  07/30/2020   Procedure: BRONCHIAL BRUSHINGS;  Surgeon: Brenna Adine CROME, DO;  Location: [facility] ENDOSCOPY;  Service: Pulmonary;;   BRONCHIAL NEEDLE ASPIRATION BIOPSY  07/30/2020   Procedure: BRONCHIAL NEEDLE ASPIRATION BIOPSIES;  Surgeon: Brenna Adine CROME, DO;  Location: [facility] ENDOSCOPY;  Service: Pulmonary;;   BRONCHIAL WASHINGS  07/30/2020   Procedure: BRONCHIAL WASHINGS;  Surgeon: Brenna Adine CROME, DO;  Location: [facility] ENDOSCOPY;  Service: Pulmonary;;   BRONCHIAL WASHINGS  04/17/2023   Procedure: BRONCHIAL WASHINGS;  Surgeon: Claudene Toribio BROCKS, MD;  Location: [facility] ENDOSCOPY;  Service: Pulmonary;;   CHEST TUBE INSERTION Left 06/27/2016   CRYOTHERAPY  04/17/2023   Procedure: CRYOTHERAPY;  Surgeon: Claudene Toribio BROCKS, MD;  Location:  [facility] ENDOSCOPY;  Service: Pulmonary;;   cryptorchidism     ESOPHAGOGASTRODUODENOSCOPY N/A 07/03/2021   Procedure: ESOPHAGOGASTRODUODENOSCOPY (EGD);  Surgeon: Elicia Claw, MD;  Location: [facility]COPY;  Service: Gastroenterology;  Laterality: N/A;   HEMOSTASIS CONTROL  04/17/2023   Procedure: HEMOSTASIS CONTROL;  Surgeon: Claudene Toribio BROCKS, MD;  Location: [facility] ENDOSCOPY;  Service: Pulmonary;;   IR PERC PLEURAL DRAIN W/INDWELL CATH W/IMG GUIDE  08/04/2021   IR REMOVAL OF PLURAL CATH W/CUFF  09/01/2021   SKIN CANCER EXCISION     VIDEO BRONCHOSCOPY Left 04/17/2023   Procedure: VIDEO BRONCHOSCOPY WITHOUT FLUORO;  Surgeon: Claudene Toribio BROCKS, MD;  Location: [facility] ENDOSCOPY;  Service: Pulmonary;  Laterality: Left;   VIDEO BRONCHOSCOPY WITH ENDOBRONCHIAL NAVIGATION Left 07/30/2020   Procedure: VIDEO BRONCHOSCOPY WITH ENDOBRONCHIAL NAVIGATION;  Surgeon: Brenna Adine CROME, DO;  Location: [facility] ENDOSCOPY;  Service: Pulmonary;  Laterality: Left;     reports that he quit smoking about 7 years ago. His smoking use included cigarettes. He started smoking about 42 years ago. He has a 35 pack-year smoking history. He has been exposed to tobacco smoke. He has never used smokeless tobacco. He reports that he does not drink alcohol  and does not use drugs.  Allergies  Allergen Reactions   Demerol [Meperidine] Nausea And Vomiting and Other (See Comments)    Made the patient violently sick   Zocor [Simvastatin] Nausea And Vomiting and Other (See Comments)    Made patient feel very jittery, also   Beet [Beta Vulgaris] Nausea And Vomiting   Liver Nausea And Vomiting and Other (See Comments)    ANY Liver!!    Family History  Problem Relation Age of Onset   Dementia Father     Prior to Admission medications   Medication Sig Start Date End Date Taking? Authorizing Provider  acetaminophen  (TYLENOL ) 500 MG tablet Take 1,000 mg by mouth every 6 (six) hours as needed for mild pain (pain score 1-3), moderate pain (pain score 4-6) or  headache.    [provider]  albuterol  (PROVENTIL ) (2.5 MG/3ML) 0.083% nebulizer solution Take 2.5 mg by nebulization every 6 (six) hours as needed for wheezing or shortness of breath.    [provider]  albuterol  (VENTOLIN  HFA) 108 (90 Base) MCG/ACT inhaler Inhale 2 puffs into the lungs every 4 (four) hours as needed for wheezing or shortness of breath. 11/04/21   Cheryle Page, MD  aspirin  EC 81 MG tablet Take 81 mg by mouth daily. Swallow whole.    [provider]  azelastine  (ASTELIN ) 0.1 % nasal spray Place 1 spray into both nostrils 2 (two) times daily as needed for allergies or rhinitis (or post-nasal drip).    [provider]  bisacodyl  (DULCOLAX) 5 MG EC tablet Take 2 tablets (10 mg total) by mouth daily as needed for moderate constipation. 01/26/23   Armenta Canning, MD  BLACK CURRANT SEED OIL PO Take 2 capsules by mouth in the morning.    [provider]  buPROPion  (WELLBUTRIN  XL) 300 MG 24 hr tablet Take 300 mg by mouth every morning. 02/05/20   [provider]  busPIRone  (BUSPAR ) 15 MG tablet Take 15 mg by mouth in the morning.    [provider]  Calcium  Carbonate (CALCIUM  500 PO) Take 500 mg by mouth in the morning.    [provider]  Carboxymethylcellulose Sod PF 0.5 % SOLN Place 1 drop into both eyes 4 (four) times daily as needed (dry eyes).    [provider]  cholecalciferol  (VITAMIN D3) 25 MCG (1000 UNIT) tablet Take 1,000 Units by mouth daily.    [provider]  Cyanocobalamin (VITAMIN B12 PO) Take 1 tablet by mouth in the morning.    [provider]  cyclobenzaprine  (FLEXERIL ) 10 MG tablet Take 20 mg by mouth at bedtime.    [provider]  cycloSPORINE  (RESTASIS ) 0.05 % ophthalmic emulsion Place 1 drop into both eyes every 12 (twelve) hours.    [provider]  diltiazem  (CARDIZEM  CD) 120 MG 24 hr capsule Take 1 capsule (120 mg total) by mouth daily. 11/28/23  02/26/24  Barbarann Nest, MD  docusate sodium  (COLACE) 100 MG capsule Take 200 mg by mouth 2 (two) times daily.    [provider]  DULoxetine  (CYMBALTA ) 60 MG capsule Take 60 mg by mouth 2 (two) times daily.    [provider]  ferrous sulfate  325 (65 FE) MG tablet Take 325 mg by mouth daily with breakfast.    [provider]  fluticasone  (FLONASE ) 50 MCG/ACT nasal spray Place 2 sprays into both nostrils in the morning.    [provider]  folic acid  (FOLVITE ) 1 MG tablet Take 1 mg by mouth daily.    [provider]  gabapentin  (NEURONTIN ) 100 MG capsule Take 100 mg by mouth 3 (three) times daily.    [provider]  GARLIC OIL PO Take 1 capsule by mouth in the morning.    [provider]  Glucerna (GLUCERNA) LIQD Take 237 mLs by mouth See admin instructions. Glucerna Therapeutic Shake chocolate- Drink 1 bottle by mouth once a day    [provider]  guaiFENesin  (MUCINEX ) 600 MG 12 hr tablet Take 1 tablet (600 mg total) by mouth 2 (two) times daily as needed for cough or to loosen phlegm. 11/28/23   Barbarann Nest, MD  HYDROcodone -acetaminophen  (NORCO) 10-325 MG tablet Take 1 tablet by mouth every 6 (six) hours as needed for moderate pain (pain score 4-6).    [provider]  KRILL OIL PO Take 2 capsules by mouth daily.    [provider]  lactulose  (CHRONULAC ) 10 GM/15ML solution Take 20 g by mouth 2 (two) times daily as needed for mild constipation.    [provider]  lidocaine  (XYLOCAINE ) 5 % ointment Apply 1 Application topically 2 (two) times daily as needed (for pain- affected sites).    [provider]  magnesium  citrate SOLN Take 0.5 Bottles by mouth daily as needed for severe constipation.    [provider]  Magnesium  Oxide (MAG-OXIDE PO) Take 1 tablet by mouth daily.    [provider]  Melatonin 3 MG CAPS Take 6 mg by mouth at bedtime.    [provider]  metFORMIN  (GLUCOPHAGE ) 850 MG tablet Take 850 mg by mouth daily after breakfast.    [provider]  Misc Natural Products (TURMERIC CURCUMIN) CAPS Take 3 capsules by mouth See admin instructions. Turmeric/Bioperine/Ginger/Garlic- Take 3 capsules by mouth once a day    [provider]  Mometasone  Furoate (ASMANEX  HFA) 200 MCG/ACT AERO Inhale 2 puffs into the lungs  daily as needed (for severe COPD symptoms (when tolerated) and RINSE MOUTH VERY WELL WITH WATER AFTERWARDS).    [provider]  Multiple Vitamin (MULTIVITAMIN) tablet Take 1 tablet by mouth daily with breakfast.    [provider]  NON FORMULARY Place 1 application  into both nostrils See admin instructions. Insta[employer]ure's Original Nose Balm with Manuka Honey- Place into the affected nasal passage twice a day as needed for irritation    [provider]  NON FORMULARY Take 2-4 tablets by mouth See admin instructions. ZzzQuil PURE Zzzs Melatonin Gummies- Chew 2 to 4 gummies by mouth at bedtime    [provider]  omeprazole  (PRILOSEC) 20 MG capsule Take 1 capsule (20 mg total) by mouth daily. Patient taking differently: Take 20 mg by mouth daily as needed (for reflux symptoms). 01/26/23   Armenta Canning, MD  ondansetron  (ZOFRAN -ODT) 4 MG disintegrating tablet Take 1 tablet (4 mg total) by mouth every 8 (eight) hours as needed for nausea or vomiting. Patient taking differently: Take 4 mg by mouth every 8 (eight) hours as needed for nausea or vomiting (dissolve orally). 05/23/23   Will Almarie MATSU, MD  Oxcarbazepine  (TRILEPTAL ) 300 MG tablet Take 600 mg by mouth in the morning, at noon, and at bedtime.    [provider]  OXYGEN  Inhale 6 L/min into the lungs continuous.    [provider]  polyethylene glycol powder (GLYCOLAX /MIRALAX ) 17 GM/SCOOP powder Take 17 g by mouth in the morning.    [provider]  predniSONE  (DELTASONE ) 20 MG tablet Take 2 tablets (40  mg total) by mouth daily with breakfast for 2 days. 11/29/23 12/01/23  Barbarann Nest, MD  senna (SENOKOT) 8.6 MG TABS tablet Take 2 tablets (17.2 mg total) by mouth 2 (two) times daily. Patient taking differently: Take 1 tablet by mouth daily. 05/23/23   Will Almarie MATSU, MD  sodium chloride  (OCEAN) 0.65 % SOLN nasal spray Place 1 spray into both nostrils daily at 2 am. Patient taking differently: Place 1 spray into both nostrils as needed for congestion. 04/24/23   Will Almarie MATSU, MD  tamsulosin  (FLOMAX ) 0.4 MG CAPS capsule Take 0.8 mg by mouth in the morning.    [provider]  Tiotropium Bromide -Olodaterol 2.5-2.5 MCG/ACT AERS Inhale 2 puffs into the lungs every morning. 2 puffs    [provider]  traZODone  (DESYREL ) 50 MG tablet Take 75 mg by mouth at bedtime.    [provider]    Physical Exam: Vitals:   11/30/23 0540 11/30/23 0545 11/30/23 0600 11/30/23 0619  BP:  (!) 146/73 (!) 142/70   Pulse: (!) 109 (!) 108 (!) 106   Resp: 17 20 (!) 21   Temp:    97.7 F (36.5 C)  TempSrc:    Axillary  SpO2: 94% 93% 93%     Constitutional: NAD, calm, comfortable, communicating well, on 10 L oxygen  via nasal cannula Eyes: PERRL, lids and conjunctivae normal ENMT: Mucous membranes are moist. Posterior pharynx clear of any exudate or lesions.Normal dentition.  Neck: normal, supple, no masses, no thyromegaly Respiratory: Tachypneic, rhonchi on bases.  No wheezing cardiovascular: Regular rate and rhythm, no murmurs / rubs / gallops.  Trace bilateral pitting edema positive. 2+ pedal pulses. No carotid bruits.  Abdomen: no tenderness, no masses palpated. No hepatosplenomegaly. Bowel sounds positive.  Musculoskeletal: no clubbing / cyanosis. No joint deformity upper and lower extremities. Good ROM, no contractures. Normal muscle tone.  Skin: no rashes, lesions, ulcers. No induration  Neurologic: CN 2-12 grossly intact. Sensation intact, DTR normal. Strength 5/5 in  all 4.  Psychiatric: Normal judgment and insight. Alert and oriented x 3. Normal mood.    Labs on Admission: I have personally reviewed following labs and imaging studies  CBC: Recent Labs  Lab 11/23/23 2315 11/24/23 0506 11/25/23 0318 11/26/23 0312 11/28/23 0558 11/30/23 0300  WBC 12.2* 13.2* 19.4* 16.8* 15.5* 16.5*  NEUTROABS 8.9*  --   --  11.4* 10.4* 12.1*  HGB 13.6 13.6 13.5 13.2 14.2 15.6  HCT 42.4 43.2 41.8 42.7 45.5 49.2  MCV 92.0 91.5 91.9 93.4 91.9 91.8  PLT 414* 397 382 377 402* 431*   Basic Metabolic Panel: Recent Labs  Lab 11/24/23 0506 11/25/23 0318 11/26/23 0312 11/28/23 0558 11/30/23 0300  NA 135 137 136 138 139  K 4.6 4.6 3.8 4.6 4.6  CL 100 100 101 100 98  CO2 22 25 25 25 27   GLUCOSE 225* 167* 117* 117* 146*  BUN 16 18 19 21  24*  CREATININE 1.02 1.06 0.90 0.93 1.20  CALCIUM  9.3 9.3 8.9 9.1 9.9   GFR: Estimated Creatinine Clearance: 79 mL/min (by C-G formula based on SCr of 1.2 mg/dL). Liver Function Tests: Recent Labs  Lab 11/24/23 0506 11/30/23 0300  AST 21 22  ALT 12 29  ALKPHOS 113 111  BILITOT 0.2 0.3  PROT 7.1 7.7  ALBUMIN 3.9 4.4   No results for input(s): LIPASE, AMYLASE in the last 168 hours. No results for input(s): AMMONIA in the last 168 hours. Coagulation Profile: No results for input(s): INR, PROTIME in the last 168 hours. Cardiac Enzymes: No results for input(s): CKTOTAL, CKMB, CKMBINDEX, TROPONINI in the last 168 hours. BNP (last 3 results) Recent Labs    11/23/23 2315 11/30/23 0300  PROBNP 85.2 127.0   HbA1C: No results for input(s): HGBA1C in the last 72 hours. CBG: Recent Labs  Lab 11/27/23 1130 11/27/23 1622 11/27/23 2013 11/28/23 0742 11/28/23 1151  GLUCAP 166* 184* 186* 108* 161*   Lipid Profile: No results for input(s): CHOL, HDL, LDLCALC, TRIG, CHOLHDL, LDLDIRECT in the last 72 hours. Thyroid  Function Tests: No results for input(s): TSH, T4TOTAL, FREET4,  T3FREE, THYROIDAB in the last 72 hours. Anemia Panel: No results for input(s): VITAMINB12, FOLATE, FERRITIN, TIBC, IRON, RETICCTPCT in the last 72 hours. Urine analysis:    Component Value Date/Time   COLORURINE YELLOW 09/25/2022 1030   APPEARANCEUR CLEAR 09/25/2022 1030   LABSPEC 1.023 09/25/2022 1030   PHURINE 6.0 09/25/2022 1030   GLUCOSEU NEGATIVE 09/25/2022 1030   HGBUR NEGATIVE 09/25/2022 1030   BILIRUBINUR NEGATIVE 09/25/2022 1030   KETONESUR NEGATIVE 09/25/2022 1030   PROTEINUR NEGATIVE 09/25/2022 1030   UROBILINOGEN 1.0 01/12/2013 0024   NITRITE NEGATIVE 09/25/2022 1030   LEUKOCYTESUR NEGATIVE 09/25/2022 1030    Radiological Exams on Admission: CT Angio Chest PE W/Cm &/Or Wo Cm Result Date: 11/30/2023 EXAM: CTA CHEST 11/30/2023 05:22:30 AM TECHNIQUE: CTA of the chest was performed without and with the administration of 100 mL iohexol  (OMNIPAQUE ) 350 MG/ML injection. Multiplanar reformatted images are provided for review. MIP images are provided for review. Automated exposure control, iterative reconstruction, and/or weight based adjustment of the mA/kV was utilized to reduce the radiation dose to as low as reasonably achievable. COMPARISON: CT angiogram of the chest dated 07/20/2023. CLINICAL HISTORY: Pulmonary embolism (PE) suspected, high prob. Increased SOB, recent admission for COPD exacerbation, wbc's 16.5, GFR>60, hx of lung ca 09/2020, radiation to left lung; Port cxr prior to ct; 100  omni 350. FINDINGS: PULMONARY ARTERIES: Pulmonary arteries are adequately opacified for evaluation. No acute pulmonary embolus. Main pulmonary artery is normal in caliber. MEDIASTINUM: There is moderate calcific coronary artery disease. The pericardium demonstrates no acute abnormality. The thoracic aorta also demonstrates moderate calcific atheromatous disease. LYMPH NODES: There are numerous shotty mediastinal nodes, as before. No hilar or axillary lymphadenopathy. LUNGS AND  PLEURA: There is moderate-to-severe central lobular emphysema present. There is extensive patchy opacification/consolidation of the lower lobes bilaterally and of the lingula. There are also patchy neuro bronchovascular nodular opacities within the right middle lobe. No evidence of pleural effusion or pneumothorax. UPPER ABDOMEN: Simple-appearing exophytic cysts arising from the kidneys bilaterally. The abdominal aorta demonstrates moderate calcific atheromatous disease. SOFT TISSUES AND BONES: No acute bone or soft tissue abnormality. IMPRESSION: 1. No evidence of pulmonary embolism. 2. Extensive multifocal pneumonia involving the lower lobes bilaterally, lingula, and right middle lobe. 3. Moderate-to-severe centrilobular emphysema. Electronically signed by: Evalene Coho MD 11/30/2023 05:50 AM EDT RP Workstation: [id number]   DG Chest Port 1 View Result Date: 11/30/2023 EXAM: 1 VIEW(S) XRAY OF THE CHEST 11/30/2023 03:07:00 AM COMPARISON: 11/23/2023 CLINICAL HISTORY: sob. Shortness of breath FINDINGS: LUNGS AND PLEURA: Patchy airspace opacities throughout left lung. Right lower lung zone airspace opacity. Small bilateral pleural effusions. No pulmonary edema. No pneumothorax. HEART AND MEDIASTINUM: No acute abnormality of the cardiac and mediastinal silhouettes. BONES AND SOFT TISSUES: No acute osseous abnormality. IMPRESSION: 1. Patchy airspace opacities throughout the left lung and in the right lower lung similar to 11/23/2023. 2. Small bilateral pleural effusions. Electronically signed by: Norman Gatlin MD 11/30/2023 03:19 AM EDT RP Workstation: [id number]    EKG: Independently reviewed.  Sinus tachycardia, left axis deviation.  No acute ST-T wave changes noted.  Assessment/Plan  Acute on chronic hypoxemic respiratory failure in the setting of multifocal pneumonia: - Recently admitted for COPD exacerbation: Discharged on 9/30.  Presented with worsening shortness of breath.  CTA chest concerning  for multifocal pneumonia. - Patient was given IV steroids, vancomycin , cefepime  in ER. - Currently requiring 10 L of oxygen  via nasal cannula.  Baseline is 8 L at home - Will admit patient on the floor.  Will will continue current antibiotics - Will check COVID flu RSV, urine Legionella and strep antigen, sputum culture and blood culture.  Will modify antibiotics based on pending results. -DuoNebs as needed - Will try to wean off of oxygen  as tolerated  Elevated troponin: - Likely demand ischemia.  Patient denies chest pain.  Trending down.  History of left lung cancer - Status post radiation in the past.  Followed by pulmonology at [facility]  Ankylosing spondylitis - On Flexeril , gabapentin  and Norco-Will continue same  Depression with prior history of suicide attempt: - Will continue home medication, bupropion , buspirone , duloxetine , oxcarbazepine  and trazodone   Hypertension: On diltiazem .  Will continue same  BPH: Continue Flomax   Type 2 diabetes: A1c 6.7. - On metformin  at home.  Hold for now.  Start sliding scale insulin   Stage III CKD: - Stable at baseline  Morbid obesity with BMI of 36 - Associated with hypertension, hyperlipidemia - Recommend diet modification, exercise and weight loss  Leukocytosis: - Likely in the setting of steroids.  He is afebrile.  Will continue to monitor  Thrombocytosis: Likely reactive.  Will continue to monitor  DVT prophylaxis: Lovenox  Code Status: DNR Family Communication: None present at bedside.  Plan of care discussed with patient in length and he verbalized understanding and agreed with it. Disposition Plan:  To be determined Consults called: None Admission status: Inpatient   Velna JONELLE Skeeter MD [facility]  If 7PM-7AM, please contact night-coverage [email or web address]  11/30/2023, 9:05 AM

## 2023-11-30 NOTE — ED Triage Notes (Signed)
 Pt BIB [facility] from home following reoccurrence of SOB. Recently DC 9/30 Dx COPD Exacerbation. On admission baseline 8 L [place] - was DC on 10 L [place]. Reports concern for continuation of SOB - worsening today. 1 Duoneb tx in route. No fevers. Has had a productive clear cough since last admission.

## 2023-11-30 NOTE — ED Provider Notes (Signed)
 [place] EMERGENCY DEPARTMENT AT [facility] Provider Note   CSN: [id number] Arrival date & time: 11/30/23  9761     Patient presents with: Shortness of Breath   Mario [name] is a 73 y.o. male.   The history is provided by the patient, the EMS personnel and medical records.  Shortness of Breath Mario [name] is a 73 y.o. male who presents to the Emergency Department complaining of shortness of breath. He presents the emergency department for evaluation of progressive shortness of breath. He has a history of COPD on baseline 8 L nasal cannula oxygen . He was admitted to the hospital September 25 and discharged home on September 30 with an increased oxygen  requirement of 10 L nasal cannula. He was also discharged home on oral prednisone . He states that he was feeling well until he arrived home and started having increased shortness of breath with activities around the home even on the 10 L nasal cannula. Today he did forget to take his prednisone  and states that the shortness of breath significantly worsened. He reports several weeks of cough productive of sputum, did have small-volume pink sputum while he was in the hospital. No associated chest pain, fevers. He does have lower extremity edema, which is baseline for him. Symptoms are severe and constant in nature.     Prior to Admission medications   Medication Sig Start Date End Date Taking? Authorizing Provider  acetaminophen  (TYLENOL ) 500 MG tablet Take 1,000 mg by mouth every 6 (six) hours as needed for mild pain (pain score 1-3), moderate pain (pain score 4-6) or headache.    [provider]  albuterol  (PROVENTIL ) (2.5 MG/3ML) 0.083% nebulizer solution Take 2.5 mg by nebulization every 6 (six) hours as needed for wheezing or shortness of breath.    [provider]  albuterol  (VENTOLIN  HFA) 108 (90 Base) MCG/ACT inhaler Inhale 2 puffs into the lungs every 4 (four) hours as needed for wheezing or  shortness of breath. 11/04/21   Cheryle Page, MD  aspirin  EC 81 MG tablet Take 81 mg by mouth daily. Swallow whole.    [provider]  azelastine  (ASTELIN ) 0.1 % nasal spray Place 1 spray into both nostrils 2 (two) times daily as needed for allergies or rhinitis (or post-nasal drip).    [provider]  bisacodyl  (DULCOLAX) 5 MG EC tablet Take 2 tablets (10 mg total) by mouth daily as needed for moderate constipation. 01/26/23   Armenta Canning, MD  BLACK CURRANT SEED OIL PO Take 2 capsules by mouth in the morning.    [provider]  buPROPion  (WELLBUTRIN  XL) 300 MG 24 hr tablet Take 300 mg by mouth every morning. 02/05/20   [provider]  busPIRone  (BUSPAR ) 15 MG tablet Take 15 mg by mouth in the morning.    [provider]  Calcium  Carbonate (CALCIUM  500 PO) Take 500 mg by mouth in the morning.    [provider]  Carboxymethylcellulose Sod PF 0.5 % SOLN Place 1 drop into both eyes 4 (four) times daily as needed (dry eyes).    [provider]  cholecalciferol  (VITAMIN D3) 25 MCG (1000 UNIT) tablet Take 1,000 Units by mouth daily.    [provider]  Cyanocobalamin (VITAMIN B12 PO) Take 1 tablet by mouth in the morning.    [provider]  cyclobenzaprine  (FLEXERIL ) 10 MG tablet Take 20 mg by mouth at bedtime.    [provider]  cycloSPORINE  (RESTASIS ) 0.05 % ophthalmic emulsion  Place 1 drop into both eyes every 12 (twelve) hours.    [provider]  diltiazem  (CARDIZEM  CD) 120 MG 24 hr capsule Take 1 capsule (120 mg total) by mouth daily. 11/28/23 02/26/24  Barbarann Nest, MD  docusate sodium  (COLACE) 100 MG capsule Take 200 mg by mouth 2 (two) times daily.    [provider]  DULoxetine  (CYMBALTA ) 60 MG capsule Take 60 mg by mouth 2 (two) times daily.    [provider]  ferrous sulfate  325 (65 FE) MG tablet Take 325 mg by mouth daily with breakfast.    [provider]   fluticasone  (FLONASE ) 50 MCG/ACT nasal spray Place 2 sprays into both nostrils in the morning.    [provider]  folic acid  (FOLVITE ) 1 MG tablet Take 1 mg by mouth daily.    [provider]  gabapentin  (NEURONTIN ) 100 MG capsule Take 100 mg by mouth 3 (three) times daily.    [provider]  GARLIC OIL PO Take 1 capsule by mouth in the morning.    [provider]  Glucerna (GLUCERNA) LIQD Take 237 mLs by mouth See admin instructions. Glucerna Therapeutic Shake chocolate- Drink 1 bottle by mouth once a day    [provider]  guaiFENesin  (MUCINEX ) 600 MG 12 hr tablet Take 1 tablet (600 mg total) by mouth 2 (two) times daily as needed for cough or to loosen phlegm. 11/28/23   Barbarann Nest, MD  HYDROcodone -acetaminophen  (NORCO) 10-325 MG tablet Take 1 tablet by mouth every 6 (six) hours as needed for moderate pain (pain score 4-6).    [provider]  KRILL OIL PO Take 2 capsules by mouth daily.    [provider]  lactulose  (CHRONULAC ) 10 GM/15ML solution Take 20 g by mouth 2 (two) times daily as needed for mild constipation.    [provider]  lidocaine  (XYLOCAINE ) 5 % ointment Apply 1 Application topically 2 (two) times daily as needed (for pain- affected sites).    [provider]  magnesium  citrate SOLN Take 0.5 Bottles by mouth daily as needed for severe constipation.    [provider]  Magnesium  Oxide (MAG-OXIDE PO) Take 1 tablet by mouth daily.    [provider]  Melatonin 3 MG CAPS Take 6 mg by mouth at bedtime.    [provider]  metFORMIN  (GLUCOPHAGE ) 850 MG tablet Take 850 mg by mouth daily after breakfast.    [provider]  Misc Natural Products (TURMERIC CURCUMIN) CAPS Take 3 capsules by mouth See admin instructions. Turmeric/Bioperine/Ginger/Garlic- Take 3 capsules by mouth once a day    [provider]  Mometasone  Furoate (ASMANEX  HFA) 200 MCG/ACT AERO  Inhale 2 puffs into the lungs daily as needed (for severe COPD symptoms (when tolerated) and RINSE MOUTH VERY WELL WITH WATER AFTERWARDS).    [provider]  Multiple Vitamin (MULTIVITAMIN) tablet Take 1 tablet by mouth daily with breakfast.    [provider]  NON FORMULARY Place 1 application  into both nostrils See admin instructions. [employer]'s Original Nose Balm with Manuka Honey- Place into the affected nasal passage twice a day as needed for irritation    [provider]  NON FORMULARY Take 2-4 tablets by mouth See admin instructions. ZzzQuil PURE Zzzs Melatonin Gummies- Chew 2 to 4 gummies by mouth at bedtime    [provider]  omeprazole  (PRILOSEC) 20 MG capsule Take 1 capsule (20 mg total) by mouth daily. Patient taking differently: Take 20  mg by mouth daily as needed (for reflux symptoms). 01/26/23   Armenta Canning, MD  ondansetron  (ZOFRAN -ODT) 4 MG disintegrating tablet Take 1 tablet (4 mg total) by mouth every 8 (eight) hours as needed for nausea or vomiting. Patient taking differently: Take 4 mg by mouth every 8 (eight) hours as needed for nausea or vomiting (dissolve orally). 05/23/23   Will Almarie MATSU, MD  Oxcarbazepine  (TRILEPTAL ) 300 MG tablet Take 600 mg by mouth in the morning, at noon, and at bedtime.    [provider]  OXYGEN  Inhale 6 L/min into the lungs continuous.    [provider]  polyethylene glycol powder (GLYCOLAX /MIRALAX ) 17 GM/SCOOP powder Take 17 g by mouth in the morning.    [provider]  predniSONE  (DELTASONE ) 20 MG tablet Take 2 tablets (40 mg total) by mouth daily with breakfast for 2 days. 11/29/23 12/01/23  Barbarann Nest, MD  senna (SENOKOT) 8.6 MG TABS tablet Take 2 tablets (17.2 mg total) by mouth 2 (two) times daily. Patient taking differently: Take 1 tablet by mouth daily. 05/23/23   Will Almarie MATSU, MD  sodium chloride  (OCEAN) 0.65 % SOLN nasal spray Place 1 spray into both  nostrils daily at 2 am. Patient taking differently: Place 1 spray into both nostrils as needed for congestion. 04/24/23   Will Almarie MATSU, MD  tamsulosin  (FLOMAX ) 0.4 MG CAPS capsule Take 0.8 mg by mouth in the morning.    [provider]  Tiotropium Bromide -Olodaterol 2.5-2.5 MCG/ACT AERS Inhale 2 puffs into the lungs every morning. 2 puffs    [provider]  traZODone  (DESYREL ) 50 MG tablet Take 75 mg by mouth at bedtime.    [provider]    Allergies: Demerol [meperidine], Zocor [simvastatin], Beet [beta vulgaris], and Liver    Review of Systems  Respiratory:  Positive for shortness of breath.   All other systems reviewed and are negative.   Updated Vital Signs BP (!) 142/70   Pulse (!) 106   Temp 97.7 F (36.5 C) (Axillary)   Resp (!) 21   SpO2 93%   Physical Exam Vitals and nursing note reviewed.  Constitutional:      Appearance: He is well-developed.  HENT:     Head: Normocephalic and atraumatic.  Cardiovascular:     Rate and Rhythm: Regular rhythm. Tachycardia present.     Heart sounds: No murmur heard. Pulmonary:     Comments: Tachypnea with mild accessory muscle use. Decreased air movement diffusely. Abdominal:     Palpations: Abdomen is soft.     Tenderness: There is no abdominal tenderness. There is no guarding or rebound.  Musculoskeletal:        General: No tenderness.     Comments: Trace non pitting edema to bilateral lower extremities.  Skin:    General: Skin is warm and dry.  Neurological:     Mental Status: He is alert and oriented to person, place, and time.  Psychiatric:        Behavior: Behavior normal.     (all labs ordered are listed, but only abnormal results are displayed) Labs Reviewed  COMPREHENSIVE METABOLIC PANEL WITH GFR - Abnormal; Notable for the following components:      Result Value   Glucose, Bld 146 (*)    BUN 24 (*)    All other components within normal limits  CBC WITH DIFFERENTIAL/PLATELET  - Abnormal; Notable for the following components:   WBC 16.5 (*)    Platelets 431 (*)  Neutro Abs 12.1 (*)    Monocytes Absolute 1.5 (*)    Abs Immature Granulocytes 0.12 (*)    All other components within normal limits  BLOOD GAS, VENOUS - Abnormal; Notable for the following components:   pO2, Ven <31 (*)    Bicarbonate 31.5 (*)    Acid-Base Excess 5.2 (*)    All other components within normal limits  TROPONIN T, HIGH SENSITIVITY - Abnormal; Notable for the following components:   Troponin T High Sensitivity 28 (*)    All other components within normal limits  TROPONIN T, HIGH SENSITIVITY - Abnormal; Notable for the following components:   Troponin T High Sensitivity 23 (*)    All other components within normal limits  MRSA NEXT GEN BY PCR, NASAL  PRO BRAIN NATRIURETIC PEPTIDE    EKG: EKG Interpretation Date/Time:  Thursday November 30 2023 02:53:37 EDT Ventricular Rate:  116 PR Interval:  180 QRS Duration:  103 QT Interval:  324 QTC Calculation: 451 R Axis:   -36  Text Interpretation: Sinus tachycardia Left axis deviation Low voltage, precordial leads Confirmed by Griselda Norris [phone]) on 11/30/2023 3:35:36 AM  Radiology: CT Angio Chest PE W/Cm &/Or Wo Cm Result Date: 11/30/2023 EXAM: CTA CHEST 11/30/2023 05:22:30 AM TECHNIQUE: CTA of the chest was performed without and with the administration of 100 mL iohexol  (OMNIPAQUE ) 350 MG/ML injection. Multiplanar reformatted images are provided for review. MIP images are provided for review. Automated exposure control, iterative reconstruction, and/or weight based adjustment of the mA/kV was utilized to reduce the radiation dose to as low as reasonably achievable. COMPARISON: CT angiogram of the chest dated 07/20/2023. CLINICAL HISTORY: Pulmonary embolism (PE) suspected, high prob. Increased SOB, recent admission for COPD exacerbation, wbc's 16.5, GFR>60, hx of lung ca 09/2020, radiation to left lung; Port cxr prior to ct; 100 omni  350. FINDINGS: PULMONARY ARTERIES: Pulmonary arteries are adequately opacified for evaluation. No acute pulmonary embolus. Main pulmonary artery is normal in caliber. MEDIASTINUM: There is moderate calcific coronary artery disease. The pericardium demonstrates no acute abnormality. The thoracic aorta also demonstrates moderate calcific atheromatous disease. LYMPH NODES: There are numerous shotty mediastinal nodes, as before. No hilar or axillary lymphadenopathy. LUNGS AND PLEURA: There is moderate-to-severe central lobular emphysema present. There is extensive patchy opacification/consolidation of the lower lobes bilaterally and of the lingula. There are also patchy neuro bronchovascular nodular opacities within the right middle lobe. No evidence of pleural effusion or pneumothorax. UPPER ABDOMEN: Simple-appearing exophytic cysts arising from the kidneys bilaterally. The abdominal aorta demonstrates moderate calcific atheromatous disease. SOFT TISSUES AND BONES: No acute bone or soft tissue abnormality. IMPRESSION: 1. No evidence of pulmonary embolism. 2. Extensive multifocal pneumonia involving the lower lobes bilaterally, lingula, and right middle lobe. 3. Moderate-to-severe centrilobular emphysema. Electronically signed by: Evalene Coho MD 11/30/2023 05:50 AM EDT RP Workstation: [id number]   DG Chest Port 1 View Result Date: 11/30/2023 EXAM: 1 VIEW(S) XRAY OF THE CHEST 11/30/2023 03:07:00 AM COMPARISON: 11/23/2023 CLINICAL HISTORY: sob. Shortness of breath FINDINGS: LUNGS AND PLEURA: Patchy airspace opacities throughout left lung. Right lower lung zone airspace opacity. Small bilateral pleural effusions. No pulmonary edema. No pneumothorax. HEART AND MEDIASTINUM: No acute abnormality of the cardiac and mediastinal silhouettes. BONES AND SOFT TISSUES: No acute osseous abnormality. IMPRESSION: 1. Patchy airspace opacities throughout the left lung and in the right lower lung similar to 11/23/2023. 2. Small  bilateral pleural effusions. Electronically signed by: Norman Gatlin MD 11/30/2023 03:19 AM EDT RP Workstation: [id number]  Procedures  CRITICAL CARE Performed by: Almarie Burner   Total critical care time: 35 minutes  Critical care time was exclusive of separately billable procedures and treating other patients.  Critical care was necessary to treat or prevent imminent or life-threatening deterioration.  Critical care was time spent personally by me on the following activities: development of treatment plan with patient and/or surrogate as well as nursing, discussions with consultants, evaluation of patient's response to treatment, examination of patient, obtaining history from patient or surrogate, ordering and performing treatments and interventions, ordering and review of laboratory studies, ordering and review of radiographic studies, pulse oximetry and re-evaluation of patient's condition.  Medications Ordered in the ED  vancomycin  (VANCOREADY) IVPB 2000 mg/400 mL (2,000 mg Intravenous New Bag/Given 11/30/23 0637)  ipratropium-albuterol  (DUONEB) 0.5-2.5 (3) MG/3ML nebulizer solution 3 mL (3 mLs Nebulization Given 11/30/23 0645)  ipratropium-albuterol  (DUONEB) 0.5-2.5 (3) MG/3ML nebulizer solution 3 mL (has no administration in time range)  ceFEPIme  (MAXIPIME ) 2 g in sodium chloride  0.9 % 100 mL IVPB (has no administration in time range)  enoxaparin  (LOVENOX ) injection 60 mg (has no administration in time range)  vancomycin  (VANCOREADY) IVPB 2000 mg/400 mL (has no administration in time range)  methylPREDNISolone  sodium succinate (SOLU-MEDROL ) 125 mg/2 mL injection 125 mg (125 mg Intravenous Given 11/30/23 0304)  albuterol  (PROVENTIL ) (2.5 MG/3ML) 0.083% nebulizer solution 5 mg (5 mg Nebulization Given 11/30/23 0302)  acetaminophen  (TYLENOL ) tablet 650 mg (650 mg Oral Given 11/30/23 0329)  iohexol  (OMNIPAQUE ) 350 MG/ML injection 100 mL (100 mLs Intravenous Contrast Given 11/30/23 0459)   ceFEPIme  (MAXIPIME ) 2 g in sodium chloride  0.9 % 100 mL IVPB (0 g Intravenous Stopped 11/30/23 0636)                                    Medical Decision Making Amount and/or Complexity of Data Reviewed Labs: ordered. Radiology: ordered.  Risk OTC drugs. Prescription drug management. Decision regarding hospitalization.   Patient with severe COPD on 8 L nasal cannula at baseline here for evaluation of progressive shortness of breath, increased oxygen  requirement. Patient with tachypnea, decreased air movement on examination. He does D sat with minimal movement on 10 L. He was treated with Solu-Medrol , duo nebs. His respiratory rate did improve on recheck but there is no change in his lung exam. Given his recent hospitalization and report of hemoptysis a CTA was obtained, which is concerning for multifocal pneumonia. Will start on broad-spectrum antibiotics. CBC was leukocytosis, history of same in the past. Given patient's severe symptoms, increased oxygen  requirement medicine consulted for admission for ongoing care.     Final diagnoses:  Multifocal pneumonia  COPD exacerbation [facility])    ED Discharge Orders     None          Burner Almarie, MD 11/30/23 [phone]

## 2023-11-30 NOTE — ED Notes (Signed)
 Pt transported to and from CT by this RN while on SpO2 and cardiac monitor. Pt returned to room. Reconnected to VS monitor. Pt readjusted sitting on edge of the bed. Denies any additional needs. Call bell within reach.

## 2023-11-30 NOTE — Progress Notes (Signed)
   11/30/23 2035  BiPAP/CPAP/SIPAP  BiPAP/CPAP/SIPAP Pt Type Adult  BiPAP/CPAP/SIPAP Resmed  Mask Type Full face mask (from home)  Dentures removed? Not applicable  EPAP 9 cmH2O  Flow Rate 10 lpm  Heater Temperature  (humidifier filled to full with sterile water)  Patient Home Machine No  Patient Home Mask Yes  Patient Home Tubing No  Auto Titrate No  Device Plugged into RED Power Outlet Yes  BiPAP/CPAP /SiPAP Vitals  Pulse Rate (!) 104  Resp 20  SpO2 98 %  Bilateral Breath Sounds Diminished  MEWS Score/Color  MEWS Score 1  MEWS Score Color Green

## 2023-11-30 NOTE — Progress Notes (Addendum)
 Pharmacy Antibiotic Note  Mario [name] is a 73 y.o. male with COPD on 8L O2 at baseline, admitted on 11/30/2023 with concern for pneumonia; recently admitted and discharged 9/30 on 10L O2 for COPD exacerbation.  Pharmacy has been consulted for vancomycin  dosing.  Plan: Vancomycin  2000 mg IV q24 hr (est AUC 538 based on SCr 1.2; Vd 0.5) Measure vancomycin  AUC at steady state as indicated SCr q48 while on vanc MRSA PCR ordered; f/u and narrow vanc as appropriate Cefepime  per MD; will renally adjust dose to 2g IV q8 hr    Temp (24hrs), Avg:98.1 F (36.7 C), Min:97.7 F (36.5 C), Max:98.3 F (36.8 C)  Recent Labs  Lab 11/24/23 0506 11/25/23 0318 11/26/23 0312 11/28/23 0558 11/30/23 0300  WBC 13.2* 19.4* 16.8* 15.5* 16.5*  CREATININE 1.02 1.06 0.90 0.93 1.20    Estimated Creatinine Clearance: 79 mL/min (by C-G formula based on SCr of 1.2 mg/dL).    Allergies  Allergen Reactions   Demerol [Meperidine] Nausea And Vomiting and Other (See Comments)    Made the patient violently sick   Zocor [Simvastatin] Nausea And Vomiting and Other (See Comments)    Made patient feel very jittery, also   Beet [Beta Vulgaris] Nausea And Vomiting   Liver Nausea And Vomiting and Other (See Comments)    ANY Liver!!     Thank you for allowing pharmacy to be a part of this patient's care.  Ranny Wiebelhaus A 11/30/2023 7:09 AM

## 2023-12-01 DIAGNOSIS — J188 Other pneumonia, unspecified organism: Secondary | ICD-10-CM | POA: Diagnosis not present

## 2023-12-01 LAB — GLUCOSE, CAPILLARY
Glucose-Capillary: 124 mg/dL — ABNORMAL HIGH (ref 70–99)
Glucose-Capillary: 119 mg/dL — ABNORMAL HIGH (ref 70–99)
Glucose-Capillary: 159 mg/dL — ABNORMAL HIGH (ref 70–99)
Glucose-Capillary: 132 mg/dL — ABNORMAL HIGH (ref 70–99)

## 2023-12-01 LAB — BASIC METABOLIC PANEL WITH GFR
Anion gap: 11 (ref 5–15)
BUN: 21 mg/dL (ref 8–23)
CO2: 26 mmol/L (ref 22–32)
Calcium: 9.1 mg/dL (ref 8.9–10.3)
Chloride: 102 mmol/L (ref 98–111)
Creatinine, Ser: 0.98 mg/dL (ref 0.61–1.24)
GFR, Estimated: >60 mL/min (ref >60)
Glucose, Bld: 114 mg/dL — ABNORMAL HIGH (ref 70–99)
Potassium: 4.2 mmol/L (ref 3.5–5.1)
Sodium: 139 mmol/L (ref 135–145)

## 2023-12-01 LAB — CBC
HCT: 44.7 % (ref 39.0–52.0)
Hemoglobin: 13.7 g/dL (ref 13.0–17.0)
MCH: 28.5 pg (ref 26.0–34.0)
MCHC: 30.6 g/dL (ref 30.0–36.0)
MCV: 93.1 fL (ref 80.0–100.0)
Platelets: 351 K/uL (ref 150–400)
RBC: 4.8 MIL/uL (ref 4.22–5.81)
RDW: 15.5 % (ref 11.5–15.5)
WBC: 17.5 K/uL — ABNORMAL HIGH (ref 4.0–10.5)
nRBC: 0 % (ref 0.0–0.2)

## 2023-12-01 MED ORDER — FLUTICASONE PROPIONATE 50 MCG/ACT NA SUSP
2.0000 | Freq: Every day | NASAL | Status: DC
  Administered 2023-12-01 – 2023-12-07 (×7): 2 via NASAL
  Filled 2023-12-01: qty 16

## 2023-12-01 MED ORDER — LACTULOSE 10 GM/15ML PO SOLN: 20.0000 g | Freq: Two times a day (BID) | ORAL | Status: DC | PRN

## 2023-12-01 MED ORDER — ARFORMOTEROL TARTRATE 15 MCG/2ML IN NEBU
15.0000 ug | INHALATION_SOLUTION | Freq: Two times a day (BID) | RESPIRATORY_TRACT | Status: DC
  Administered 2023-12-01 – 2023-12-07 (×13): 15 ug via RESPIRATORY_TRACT
  Filled 2023-12-01 (×14): qty 2

## 2023-12-01 MED ORDER — IPRATROPIUM-ALBUTEROL 0.5-2.5 (3) MG/3ML IN SOLN
3.0000 mL | Freq: Three times a day (TID) | RESPIRATORY_TRACT | Status: DC | PRN
  Administered 2023-12-04: 3 mL via RESPIRATORY_TRACT
  Filled 2023-12-01: qty 3

## 2023-12-01 MED ORDER — SALINE SPRAY 0.65 % NA SOLN
1.0000 | NASAL | Status: DC | PRN
  Administered 2023-12-01 – 2023-12-04 (×2): 1 via NASAL
  Filled 2023-12-01: qty 44

## 2023-12-01 MED ORDER — POLYVINYL ALCOHOL 1.4 % OP SOLN
1.0000 [drp] | OPHTHALMIC | Status: DC | PRN
  Filled 2023-12-01: qty 15

## 2023-12-01 MED ORDER — CYCLOSPORINE 0.05 % OP EMUL
1.0000 [drp] | Freq: Two times a day (BID) | OPHTHALMIC | Status: DC
  Administered 2023-12-01 – 2023-12-07 (×12): 1 [drp] via OPHTHALMIC
  Filled 2023-12-01 (×12): qty 30

## 2023-12-01 MED ORDER — PREDNISONE 20 MG PO TABS
40.0000 mg | ORAL_TABLET | Freq: Every day | ORAL | Status: DC
  Administered 2023-12-02 – 2023-12-06 (×5): 40 mg via ORAL
  Filled 2023-12-01 (×5): qty 2

## 2023-12-01 MED ORDER — GUAIFENESIN 100 MG/5ML PO LIQD
5.0000 mL | ORAL | Status: DC | PRN
  Administered 2023-12-03: 5 mL via ORAL
  Filled 2023-12-01: qty 10

## 2023-12-01 MED ORDER — UMECLIDINIUM BROMIDE 62.5 MCG/ACT IN AEPB
1.0000 | INHALATION_SPRAY | Freq: Every day | RESPIRATORY_TRACT | Status: DC
Start: 2023-12-01 — End: 2023-12-07
  Administered 2023-12-01 – 2023-12-07 (×7): 1 via RESPIRATORY_TRACT
  Filled 2023-12-01: qty 7

## 2023-12-01 NOTE — Progress Notes (Signed)
   12/01/23 2027  BiPAP/CPAP/SIPAP  BiPAP/CPAP/SIPAP Pt Type Adult (prefers self placement)  BiPAP/CPAP/SIPAP Resmed  Mask Type Full face mask (from home)  Dentures removed? Not applicable  EPAP 9 cmH2O  Flow Rate 10 lpm  Heater Temperature  (humidifier filled to full line with sterile water)  Patient Home Machine No  Patient Home Mask Yes  Patient Home Tubing No  Auto Titrate No  CPAP/SIPAP surface wiped down Yes  Device Plugged into RED Power Outlet Yes  BiPAP/CPAP /SiPAP Vitals  Pulse Rate (!) 102  Resp 20  SpO2 92 %  Bilateral Breath Sounds Clear;Diminished  MEWS Score/Color  MEWS Score 1  MEWS Score Color Green

## 2023-12-01 NOTE — Progress Notes (Addendum)
 PROGRESS NOTE    ANTHONYMICHAEL [name]  FMW:[id number] DOB: [birth year] DOA: 11/30/2023 PCP: [name]   Brief Narrative:  Mario [name] is a 73 y.o. male with medical history significant of ankylosing spondylitis, chronic hypoxemic respiratory failure in the setting of COPD on 8 L of oxygen  via nasal cannulae at home, depression with prior history of suicide attempt, left lung cancer s/p radiation therapy, hypertension, OSA, obesity, type 2 diabetes, CKD stage III A presented with worsening shortness of breath.  Patient had bronchoscopy with biopsy this past spring concerning for adenocarcinoma.  He was scheduled to follow-up locally with Dr. [name] but [facility] did not show up to his appointment there and appears to have been lost to follow-up.  Patient was just seen in our facility and discharged on 9/30 for presumed COPD exacerbation, he appears to have been noncompliant with medications at home and reports back to our facility on 10/2 for worsening hypoxia from baseline.  Assessment & Plan:   Active Problems:   Multifocal pneumonia  Acute on chronic hypoxemic respiratory failure in the setting of multifocal pneumonia Rule out worsening lung cancer Hemoptysis, transient -Patient recently admitted and discharged on 9/30 for COPD exacerbation  -Continue steroid taper -Markedly abnormal CTA concerning for multifocal pneumonia, upon further evaluation and discussion with pulmonology his left lung changes appear to be secondary to prior radiation from lung cancer and right sided infiltrate is concerning for possible new malignancy given duration as it was present on imaging earlier this year as well as previously last year. - Patient was to be evaluated locally by oncology for possible systemic treatment but declined and requested transfer of care and follow up at the [facility], it is unclear whether or not he underwent any treatment or  evaluation at their facility. **No show note on May 13 at the [facility]- no notes after this date** - Recent admission for COPD exacerbation discharged on 9/30 - Given abnormal CTA here concerning for possible infiltrate will treat patient for multifocal pneumonia/HCAP given recent admission with cefepime  vancomycin  - Patient does not appear septic, does not meet sepsis criteria, remains afebrile -mild leukocytosis in the setting of recent steroid use - low threshold to discontinue antibiotics - Baseline oxygen  at this point is 8 L nasal cannula at home, he is minimally active due to profound dyspnea -Viral panel negative, cultures remain pending -Previous history of hemoptysis, continue to follow clinically - hold anticoagulation   Presumed recurrent lung cancer, POA - See above, oncology consulted for further insight and recommendations (patient previously refusing systemic treatment) - Unclear at this time whether or not patient has a clear avenue for treatment moving forward, will likely need to involve palliative care in the interim  Elevated troponin: - Likely type II, in the setting of hypoxia.   Prior history of left lung cancer - Status post radiation in the past.  Followed by pulmonology at [facility]   Ankylosing spondylitis - On Flexeril , gabapentin  and Norco-Will continue same   Depression with prior history of suicide attempt: - Continue bupropion , buspirone , duloxetine , oxcarbazepine  and trazodone    Hypertension: On diltiazem .  Will continue same   BPH: Continue Flomax    Type 2 diabetes: A1c 6.7.  Uncontrolled with hyperglycemia - On metformin  at home.  Hold for now.  Start sliding scale insulin    Stage III CKD: - Stable at baseline Morbid obesity with BMI of 36 - Associated with hypertension, hyperlipidemia  Thrombocytosis: Likely reactive.  Will continue to monitor  DVT prophylaxis: SCDs Start: 11/30/23 0901 hold chemical prophylaxis in the setting of hemoptysis Code Status:    Code Status: Limited: Do not attempt resuscitation (DNR) -DNR-LIMITED -Do Not Intubate/DNI  Family Communication: None present  Status is: Inpatient  Dispo: The patient is from: Home              Anticipated d/c is to: To be determined              Anticipated d/c date is: To be determined              Patient currently not medically stable for discharge  Consultants:  Oncology  Procedures:  None  Antimicrobials:  Cefepime , vancomycin   Subjective: No acute issues or events overnight denies chest pain fevers chills nausea vomiting diarrhea constipation.  Shortness of breath ongoing with no real improvement, worsening cough with noted scant hemoptysis overnight as well.  Objective: Vitals:   11/30/23 1945 11/30/23 2035 11/30/23 2226 12/01/23 0512  BP:   135/78 (!) 142/52  Pulse:  (!) 104 (!) 101 90  Resp:  20 20 15   Temp:   97.8 F (36.6 C) 98.3 F (36.8 C)  TempSrc:   Oral   SpO2: 98% 98% 95% 100%  Weight:      Height:        Intake/Output Summary (Last 24 hours) at 12/01/2023 0724 Last data filed at 12/01/2023 0626 Gross per 24 hour  Intake 1003.77 ml  Output 1400 ml  Net -396.23 ml   Filed Weights   11/30/23 1900  Weight: 127.8 kg    Examination:  General:  Pleasantly resting in bed, No acute distress. HEENT:  Normocephalic atraumatic.  Sclerae nonicteric, noninjected.  Neck:  Without mass or deformity.  Trachea is midline. Lungs: Diminished bilaterally with diffuse rhonchi, no wheeze or rales noted Heart:  Regular rate and rhythm.  Without murmurs, rubs, or gallops. Abdomen:  Soft, he is, nontender, nondistended.  Without guarding or rebound. Extremities: Without cyanosis, clubbing, edema, or obvious deformity. Skin:  Warm and dry, no erythema.  Data Reviewed: I have personally reviewed following labs and imaging studies  CBC: Recent Labs  Lab 11/26/23 0312 11/28/23 0558 11/30/23 0300 11/30/23 0901 12/01/23 0525  WBC 16.8* 15.5* 16.5* 13.3* 17.5*   NEUTROABS 11.4* 10.4* 12.1*  --   --   HGB 13.2 14.2 15.6 14.0 13.7  HCT 42.7 45.5 49.2 44.0 44.7  MCV 93.4 91.9 91.8 91.1 93.1  PLT 377 402* 431* 384 351   Basic Metabolic Panel: Recent Labs  Lab 11/25/23 0318 11/26/23 0312 11/28/23 0558 11/30/23 0300 11/30/23 0901 12/01/23 0525  NA 137 136 138 139  --  139  K 4.6 3.8 4.6 4.6  --  4.2  CL 100 101 100 98  --  102  CO2 25 25 25 27   --  26  GLUCOSE 167* 117* 117* 146*  --  114*  BUN 18 19 21  24*  --  21  CREATININE 1.06 0.90 0.93 1.20 0.96 0.98  CALCIUM  9.3 8.9 9.1 9.9  --  9.1   GFR: Estimated Creatinine Clearance: 96.8 mL/min (by C-G formula based on SCr of 0.98 mg/dL). Liver Function Tests: Recent Labs  Lab 11/30/23 0300  AST 22  ALT 29  ALKPHOS 111  BILITOT 0.3  PROT 7.7  ALBUMIN 4.4   BNP (last 3 results) Recent Labs    11/23/23 2315 11/30/23 0300  PROBNP 85.2 127.0  HbA1C: Recent Labs    11/30/23 1737  HGBA1C 6.0*   CBG: Recent Labs  Lab 11/28/23 0742 11/28/23 1151 11/30/23 1053 11/30/23 1632 11/30/23 2058  GLUCAP 108* 161* 180* 127* 173*    Recent Results (from the past 240 hours)  Resp panel by RT-PCR (RSV, Flu A&B, Covid) Anterior Nasal Swab     Status: None   Collection Time: 11/23/23 11:06 PM   Specimen: Anterior Nasal Swab  Result Value Ref Range Status   SARS Coronavirus 2 by RT PCR NEGATIVE NEGATIVE Final    Comment: (NOTE) SARS-CoV-2 target nucleic acids are NOT DETECTED.  The SARS-CoV-2 RNA is generally detectable in upper respiratory specimens during the acute phase of infection. The lowest concentration of SARS-CoV-2 viral copies this assay can detect is 138 copies/mL. A negative result does not preclude SARS-Cov-2 infection and should not be used as the sole basis for treatment or other patient management decisions. A negative result may occur with  improper specimen collection/handling, submission of specimen other than nasopharyngeal swab, presence of viral mutation(s)  within the areas targeted by this assay, and inadequate number of viral copies(<138 copies/mL). A negative result must be combined with clinical observations, patient history, and epidemiological information. The expected result is Negative.  Fact Sheet for Patients:  BloggerCourse.com  Fact Sheet for Healthcare Providers:  SeriousBroker.it  This test is no t yet approved or cleared by the [place]  FDA and  has been authorized for detection and/or diagnosis of SARS-CoV-2 by FDA under an Emergency Use Authorization (EUA). This EUA will remain  in effect (meaning this test can be used) for the duration of the COVID-19 declaration under Section 564(b)(1) of the Act, 21 U.S.C.section 360bbb-3(b)(1), unless the authorization is terminated  or revoked sooner.       Influenza A by PCR NEGATIVE NEGATIVE Final   Influenza B by PCR NEGATIVE NEGATIVE Final    Comment: (NOTE) The Xpert Xpress SARS-CoV-2/FLU/RSV plus assay is intended as an aid in the diagnosis of influenza from Nasopharyngeal swab specimens and should not be used as a sole basis for treatment. Nasal washings and aspirates are unacceptable for Xpert Xpress SARS-CoV-2/FLU/RSV testing.  Fact Sheet for Patients: BloggerCourse.com  Fact Sheet for Healthcare Providers: SeriousBroker.it  This test is not yet approved or cleared by the [place]  FDA and has been authorized for detection and/or diagnosis of SARS-CoV-2 by FDA under an Emergency Use Authorization (EUA). This EUA will remain in effect (meaning this test can be used) for the duration of the COVID-19 declaration under Section 564(b)(1) of the Act, 21 U.S.C. section 360bbb-3(b)(1), unless the authorization is terminated or revoked.     Resp Syncytial Virus by PCR NEGATIVE NEGATIVE Final    Comment: (NOTE) Fact Sheet for  Patients: BloggerCourse.com  Fact Sheet for Healthcare Providers: SeriousBroker.it  This test is not yet approved or cleared by the [place]  FDA and has been authorized for detection and/or diagnosis of SARS-CoV-2 by FDA under an Emergency Use Authorization (EUA). This EUA will remain in effect (meaning this test can be used) for the duration of the COVID-19 declaration under Section 564(b)(1) of the Act, 21 U.S.C. section 360bbb-3(b)(1), unless the authorization is terminated or revoked.  Performed at [facility], [address], [place], [place] [address]   MRSA Next Gen by PCR, Nasal     Status: Abnormal   Collection Time: 11/24/23  4:29 AM   Specimen: Nasal Mucosa; Nasal Swab  Result Value Ref Range Status  MRSA by PCR Next Gen DETECTED (A) NOT DETECTED Final    Comment: RESULT CALLED TO, READ BACK BY AND VERIFIED WITH: CONTRERAS, F. 0907 11/24/23 BY JE (NOTE) The GeneXpert MRSA Assay (FDA approved for NASAL specimens only), is one component of a comprehensive MRSA colonization surveillance program. It is not intended to diagnose MRSA infection nor to guide or monitor treatment for MRSA infections. Test performance is not FDA approved in patients less than 61 years old. Performed at [facility], [address] [place], [place] [address]   Respiratory (~20 pathogens) panel by PCR     Status: None   Collection Time: 11/24/23 12:16 PM   Specimen: Nasopharyngeal Swab; Respiratory  Result Value Ref Range Status   Adenovirus NOT DETECTED NOT DETECTED Final   Coronavirus 229E NOT DETECTED NOT DETECTED Final    Comment: (NOTE) The Coronavirus on the Respiratory Panel, DOES NOT test for the novel  Coronavirus (2019 nCoV)    Coronavirus HKU1 NOT DETECTED NOT DETECTED Final   Coronavirus NL63 NOT DETECTED NOT DETECTED Final   Coronavirus OC43 NOT DETECTED NOT DETECTED Final    Metapneumovirus NOT DETECTED NOT DETECTED Final   Rhinovirus / Enterovirus NOT DETECTED NOT DETECTED Final   Influenza A NOT DETECTED NOT DETECTED Final   Influenza B NOT DETECTED NOT DETECTED Final   Parainfluenza Virus 1 NOT DETECTED NOT DETECTED Final   Parainfluenza Virus 2 NOT DETECTED NOT DETECTED Final   Parainfluenza Virus 3 NOT DETECTED NOT DETECTED Final   Parainfluenza Virus 4 NOT DETECTED NOT DETECTED Final   Respiratory Syncytial Virus NOT DETECTED NOT DETECTED Final   Bordetella pertussis NOT DETECTED NOT DETECTED Final   Bordetella Parapertussis NOT DETECTED NOT DETECTED Final   Chlamydophila pneumoniae NOT DETECTED NOT DETECTED Final   Mycoplasma pneumoniae NOT DETECTED NOT DETECTED Final    Comment: Performed at [facility], [address] [place], [place] [address]  MRSA Next Gen by PCR, Nasal     Status: Abnormal   Collection Time: 11/30/23  6:53 AM   Specimen: Nasal Mucosa; Nasal Swab  Result Value Ref Range Status   MRSA by PCR Next Gen DETECTED (A) NOT DETECTED Final    Comment: RESULT CALLED TO, READ BACK BY AND VERIFIED WITH:  [place], D 11/30/23 1126 AJ (NOTE) The GeneXpert MRSA Assay (FDA approved for NASAL specimens only), is one component of a comprehensive MRSA colonization surveillance program. It is not intended to diagnose MRSA infection nor to guide or monitor treatment for MRSA infections. Test performance is not FDA approved in patients less than 60 years old. Performed at [facility], [address] [place], [place] [address]   Resp panel by RT-PCR (RSV, Flu A&B, Covid) Anterior Nasal Swab     Status: None   Collection Time: 11/30/23  9:00 AM   Specimen: Anterior Nasal Swab  Result Value Ref Range Status   SARS Coronavirus 2 by RT PCR NEGATIVE NEGATIVE Final    Comment: (NOTE) SARS-CoV-2 target nucleic acids are NOT DETECTED.  The SARS-CoV-2 RNA is generally detectable in upper respiratory specimens during the  acute phase of infection. The lowest concentration of SARS-CoV-2 viral copies this assay can detect is 138 copies/mL. A negative result does not preclude SARS-Cov-2 infection and should not be used as the sole basis for treatment or other patient management decisions. A negative result may occur with  improper specimen collection/handling, submission of specimen other than nasopharyngeal swab, presence of viral mutation(s)  within the areas targeted by this assay, and inadequate number of viral copies(<138 copies/mL). A negative result must be combined with clinical observations, patient history, and epidemiological information. The expected result is Negative.  Fact Sheet for Patients:  BloggerCourse.com  Fact Sheet for Healthcare Providers:  SeriousBroker.it  This test is no t yet approved or cleared by the [place]  FDA and  has been authorized for detection and/or diagnosis of SARS-CoV-2 by FDA under an Emergency Use Authorization (EUA). This EUA will remain  in effect (meaning this test can be used) for the duration of the COVID-19 declaration under Section 564(b)(1) of the Act, 21 U.S.C.section 360bbb-3(b)(1), unless the authorization is terminated  or revoked sooner.       Influenza A by PCR NEGATIVE NEGATIVE Final   Influenza B by PCR NEGATIVE NEGATIVE Final    Comment: (NOTE) The Xpert Xpress SARS-CoV-2/FLU/RSV plus assay is intended as an aid in the diagnosis of influenza from Nasopharyngeal swab specimens and should not be used as a sole basis for treatment. Nasal washings and aspirates are unacceptable for Xpert Xpress SARS-CoV-2/FLU/RSV testing.  Fact Sheet for Patients: BloggerCourse.com  Fact Sheet for Healthcare Providers: SeriousBroker.it  This test is not yet approved or cleared by the [place]  FDA and has been authorized for detection and/or  diagnosis of SARS-CoV-2 by FDA under an Emergency Use Authorization (EUA). This EUA will remain in effect (meaning this test can be used) for the duration of the COVID-19 declaration under Section 564(b)(1) of the Act, 21 U.S.C. section 360bbb-3(b)(1), unless the authorization is terminated or revoked.     Resp Syncytial Virus by PCR NEGATIVE NEGATIVE Final    Comment: (NOTE) Fact Sheet for Patients: BloggerCourse.com  Fact Sheet for Healthcare Providers: SeriousBroker.it  This test is not yet approved or cleared by the [place]  FDA and has been authorized for detection and/or diagnosis of SARS-CoV-2 by FDA under an Emergency Use Authorization (EUA). This EUA will remain in effect (meaning this test can be used) for the duration of the COVID-19 declaration under Section 564(b)(1) of the Act, 21 U.S.C. section 360bbb-3(b)(1), unless the authorization is terminated or revoked.  Performed at [facility], [address], [place], [place] [address]   Culture, blood (routine x 2) Call MD if unable to obtain prior to antibiotics being given     Status: None (Preliminary result)   Collection Time: 11/30/23  5:37 PM   Specimen: BLOOD RIGHT HAND  Result Value Ref Range Status   Specimen Description   Final    BLOOD RIGHT HAND Performed at [facility], [address] [place], [place] [address]    Special Requests   Final    BOTTLES DRAWN AEROBIC AND ANAEROBIC Blood Culture adequate volume Performed at [facility], [address], [place], [place] [address]    Culture PENDING  Incomplete   Report Status PENDING  Incomplete  Culture, blood (routine x 2) Call MD if unable to obtain prior to antibiotics being given     Status: None (Preliminary result)   Collection Time: 11/30/23  5:41 PM   Specimen: BLOOD LEFT HAND  Result Value Ref Range Status   Specimen Description   Final    BLOOD  LEFT HAND Performed at [facility], [address], [place], [place] [address]    Special Requests   Final    BOTTLES DRAWN AEROBIC AND ANAEROBIC Blood Culture adequate volume Performed at [facility], [address] [place], [place] [address]    Culture PENDING  Incomplete   Report Status PENDING  Incomplete         Radiology Studies: CT Angio Chest PE W/Cm &/Or Wo Cm Result Date: 11/30/2023 EXAM: CTA CHEST 11/30/2023 05:22:30 AM TECHNIQUE: CTA of the chest was performed without and with the administration of 100 mL iohexol  (OMNIPAQUE ) 350 MG/ML injection. Multiplanar reformatted images are provided for review. MIP images are provided for review. Automated exposure control, iterative reconstruction, and/or weight based adjustment of the mA/kV was utilized to reduce the radiation dose to as low as reasonably achievable. COMPARISON: CT angiogram of the chest dated 07/20/2023. CLINICAL HISTORY: Pulmonary embolism (PE) suspected, high prob. Increased SOB, recent admission for COPD exacerbation, wbc's 16.5, GFR>60, hx of lung ca 09/2020, radiation to left lung; Port cxr prior to ct; 100 omni 350. FINDINGS: PULMONARY ARTERIES: Pulmonary arteries are adequately opacified for evaluation. No acute pulmonary embolus. Main pulmonary artery is normal in caliber. MEDIASTINUM: There is moderate calcific coronary artery disease. The pericardium demonstrates no acute abnormality. The thoracic aorta also demonstrates moderate calcific atheromatous disease. LYMPH NODES: There are numerous shotty mediastinal nodes, as before. No hilar or axillary lymphadenopathy. LUNGS AND PLEURA: There is moderate-to-severe central lobular emphysema present. There is extensive patchy opacification/consolidation of the lower lobes bilaterally and of the lingula. There are also patchy neuro bronchovascular nodular opacities within the right middle lobe. No evidence of pleural effusion or pneumothorax. UPPER  ABDOMEN: Simple-appearing exophytic cysts arising from the kidneys bilaterally. The abdominal aorta demonstrates moderate calcific atheromatous disease. SOFT TISSUES AND BONES: No acute bone or soft tissue abnormality. IMPRESSION: 1. No evidence of pulmonary embolism. 2. Extensive multifocal pneumonia involving the lower lobes bilaterally, lingula, and right middle lobe. 3. Moderate-to-severe centrilobular emphysema. Electronically signed by: Evalene Coho MD 11/30/2023 05:50 AM EDT RP Workstation: [id number]   DG Chest Port 1 View Result Date: 11/30/2023 EXAM: 1 VIEW(S) XRAY OF THE CHEST 11/30/2023 03:07:00 AM COMPARISON: 11/23/2023 CLINICAL HISTORY: sob. Shortness of breath FINDINGS: LUNGS AND PLEURA: Patchy airspace opacities throughout left lung. Right lower lung zone airspace opacity. Small bilateral pleural effusions. No pulmonary edema. No pneumothorax. HEART AND MEDIASTINUM: No acute abnormality of the cardiac and mediastinal silhouettes. BONES AND SOFT TISSUES: No acute osseous abnormality. IMPRESSION: 1. Patchy airspace opacities throughout the left lung and in the right lower lung similar to 11/23/2023. 2. Small bilateral pleural effusions. Electronically signed by: Norman Gatlin MD 11/30/2023 03:19 AM EDT RP Workstation: [id number]        Scheduled Meds:  aspirin  EC  81 mg Oral Daily   buPROPion   300 mg Oral q morning   busPIRone   15 mg Oral q AM   cyclobenzaprine   20 mg Oral QHS   diltiazem   120 mg Oral Daily   DULoxetine   60 mg Oral BID   enoxaparin  (LOVENOX ) injection  60 mg Subcutaneous Q24H   gabapentin   100 mg Oral TID   insulin  aspart  0-15 Units Subcutaneous TID WC   insulin  aspart  0-5 Units Subcutaneous QHS   ipratropium-albuterol   3 mL Nebulization TID   Oxcarbazepine   900 mg Oral BID   pantoprazole   40 mg Oral Daily   tamsulosin   0.8 mg Oral q AM   traZODone   75 mg Oral QHS   Continuous Infusions:  ceFEPime  (MAXIPIME ) IV 2 g (12/01/23 0537)   vancomycin   Stopped (11/30/23 2350)     LOS: 1 day   Time spent:  Elsie JAYSON Montclair,  DO [facility]  If 7PM-7AM, please contact night-coverage [email or web address]  12/01/2023, 7:24 AM

## 2023-12-01 NOTE — Plan of Care (Signed)

## 2023-12-01 NOTE — Plan of Care (Signed)
  Problem: Coping: Goal: Ability to adjust to condition or change in health will improve Outcome: Progressing   Problem: Fluid Volume: Goal: Ability to maintain a balanced intake and output will improve Outcome: Progressing   Problem: Health Behavior/Discharge Planning: Goal: Ability to identify and utilize available resources and services will improve Outcome: Progressing Goal: Ability to manage health-related needs will improve Outcome: Progressing   Problem: Metabolic: Goal: Ability to maintain appropriate glucose levels will improve Outcome: Progressing   Problem: Nutritional: Goal: Maintenance of adequate nutrition will improve Outcome: Progressing   Problem: Education: Goal: Knowledge of General Education information will improve Description: Including pain rating scale, medication(s)/side effects and non-pharmacologic comfort measures Outcome: Progressing   Problem: Clinical Measurements: Goal: Ability to maintain clinical measurements within normal limits will improve Outcome: Progressing Goal: Diagnostic test results will improve Outcome: Progressing Goal: Respiratory complications will improve Outcome: Progressing   Problem: Respiratory: Goal: Ability to maintain adequate ventilation will improve Outcome: Progressing Goal: Ability to maintain a clear airway will improve Outcome: Progressing

## 2023-12-02 DIAGNOSIS — Z7189 Other specified counseling: Secondary | ICD-10-CM | POA: Diagnosis not present

## 2023-12-02 DIAGNOSIS — C3490 Malignant neoplasm of unspecified part of unspecified bronchus or lung: Secondary | ICD-10-CM | POA: Diagnosis not present

## 2023-12-02 DIAGNOSIS — J188 Other pneumonia, unspecified organism: Secondary | ICD-10-CM | POA: Diagnosis not present

## 2023-12-02 LAB — LEGIONELLA PNEUMOPHILA SEROGP 1 UR AG: L. pneumophila Serogp 1 Ur Ag: NEGATIVE

## 2023-12-02 LAB — BASIC METABOLIC PANEL WITH GFR
Anion gap: 10 (ref 5–15)
BUN: 17 mg/dL (ref 8–23)
CO2: 25 mmol/L (ref 22–32)
Calcium: 8.9 mg/dL (ref 8.9–10.3)
Chloride: 100 mmol/L (ref 98–111)
Creatinine, Ser: 0.84 mg/dL (ref 0.61–1.24)
GFR, Estimated: >60 mL/min (ref >60)
Glucose, Bld: 90 mg/dL (ref 70–99)
Potassium: 4.3 mmol/L (ref 3.5–5.1)
Sodium: 135 mmol/L (ref 135–145)

## 2023-12-02 LAB — GLUCOSE, CAPILLARY
Glucose-Capillary: 98 mg/dL (ref 70–99)
Glucose-Capillary: 117 mg/dL — ABNORMAL HIGH (ref 70–99)
Glucose-Capillary: 149 mg/dL — ABNORMAL HIGH (ref 70–99)
Glucose-Capillary: 195 mg/dL — ABNORMAL HIGH (ref 70–99)

## 2023-12-02 LAB — CBC
HCT: 42.6 % (ref 39.0–52.0)
Hemoglobin: 13.4 g/dL (ref 13.0–17.0)
MCH: 29.4 pg (ref 26.0–34.0)
MCHC: 31.5 g/dL (ref 30.0–36.0)
MCV: 93.4 fL (ref 80.0–100.0)
Platelets: 336 K/uL (ref 150–400)
RBC: 4.56 MIL/uL (ref 4.22–5.81)
RDW: 15.4 % (ref 11.5–15.5)
WBC: 12.5 K/uL — ABNORMAL HIGH (ref 4.0–10.5)
nRBC: 0 % (ref 0.0–0.2)

## 2023-12-02 MED ORDER — VANCOMYCIN HCL 1250 MG/250ML IV SOLN
1250.0000 mg | Freq: Two times a day (BID) | INTRAVENOUS | Status: DC
  Administered 2023-12-02 – 2023-12-05 (×6): 1250 mg via INTRAVENOUS
  Filled 2023-12-02 (×6): qty 250

## 2023-12-02 NOTE — Progress Notes (Signed)
 PROGRESS NOTE    Mario [name]  FMW:[id number] DOB: Dec 13, 1950 DOA: 11/30/2023 PCP: [name]   Brief Narrative:  Mario [name] is a 73 y.o. male with medical history significant of ankylosing spondylitis, chronic hypoxemic respiratory failure in the setting of COPD on 8 L of oxygen  via nasal cannulae at home, depression with prior history of suicide attempt, left lung cancer s/p radiation therapy, hypertension, OSA, obesity, type 2 diabetes, CKD stage III A presented with worsening shortness of breath.  Patient had bronchoscopy with biopsy this past spring concerning for adenocarcinoma.  He was scheduled to follow-up locally with Dr. [name] but [facility] did not show up to his appointment there and appears to have been lost to follow-up.  Patient was just seen in our facility and discharged on 9/30 for presumed COPD exacerbation, he appears to have been noncompliant with medications at home and reports back to our facility on 10/2 for worsening hypoxia from baseline.  Assessment & Plan:   Active Problems:   Multifocal pneumonia  Acute on chronic hypoxemic respiratory failure in the setting of multifocal pneumonia Rule out worsening lung cancer Hemoptysis, transient -Patient recently admitted and discharged on 9/30 for COPD exacerbation  -Continue steroid taper -Markedly abnormal CTA concerning for multifocal pneumonia, upon further evaluation and discussion with pulmonology his left lung changes appear to be secondary to prior radiation from lung cancer and right sided infiltrate is concerning for possible new malignancy given duration as it was present on imaging earlier this year as well as previously last year. - Patient was to be evaluated locally by oncology for possible systemic treatment but declined and requested transfer of care and follow up at the [facility], it is unclear whether or not he underwent any treatment or  evaluation at their facility. **No show note on May 13 at the [place]- no notes after this date** - Given abnormal CTA here concerning for possible infiltrate will treat patient for multifocal pneumonia/HCAP given recent admission with cefepime  vancomycin ; Patient does not appear septic, does not meet sepsis criteria, remains afebrile -mild leukocytosis in the setting of recent steroid use - low threshold to discontinue antibiotics - Baseline oxygen  at this point is 8 L nasal cannula at home, he is minimally active due to profound dyspnea -Viral panel negative, cultures remain pending -Previous history of hemoptysis, continue to follow clinically - hold anticoagulation - Patient now requesting oncology evaluation for possible treatment if available.   Presumed recurrent lung cancer, POA - See above, oncology consulted for further insight and recommendations (patient previously refusing systemic treatment) - Unclear at this time whether or not patient has a clear avenue for treatment moving forward, will likely need to involve palliative care in the interim  Elevated troponin: - Likely type II, in the setting of hypoxia.   Prior history of left lung cancer - Status post radiation in the past.  Followed by pulmonology at [facility]   Ankylosing spondylitis - On Flexeril , gabapentin  and Norco-Will continue same   Depression with prior history of suicide attempt: - Continue bupropion , buspirone , duloxetine , oxcarbazepine  and trazodone    Hypertension: On diltiazem .  Will continue same   BPH: Continue Flomax    Type 2 diabetes: A1c 6.7.  Uncontrolled with hyperglycemia - On metformin  at home.  Hold for now.  Start sliding scale insulin    Stage III CKD: - Stable at baseline Morbid obesity with BMI of 36 - Associated with hypertension, hyperlipidemia  Thrombocytosis: Likely reactive.  Will continue to monitor  DVT prophylaxis: SCDs Start: 11/30/23 0901 hold chemical prophylaxis in the setting of  hemoptysis Code Status:   Code Status: Limited: Do not attempt resuscitation (DNR) -DNR-LIMITED -Do Not Intubate/DNI  Family Communication: None present  Status is: Inpatient  Dispo: The patient is from: Home              Anticipated d/c is to: To be determined              Anticipated d/c date is: To be determined              Patient currently not medically stable for discharge  Consultants:  Oncology  Procedures:  None  Antimicrobials:  Cefepime , vancomycin   Subjective: No acute issues or events overnight denies chest pain fevers chills nausea vomiting diarrhea constipation.  Shortness of breath ongoing with no real improvement, worsening cough with noted scant hemoptysis overnight as well.  Objective: Vitals:   12/01/23 1312 12/01/23 1907 12/01/23 2027 12/02/23 0441  BP:  137/88  130/78  Pulse:  (!) 103 (!) 102 92  Resp:  18 20 20   Temp:  98.3 F (36.8 C)  97.7 F (36.5 C)  TempSrc:    Oral  SpO2: 94% 91% 92% 92%  Weight:      Height:        Intake/Output Summary (Last 24 hours) at 12/02/2023 0745 Last data filed at 12/02/2023 0500 Gross per 24 hour  Intake 700 ml  Output 1500 ml  Net -800 ml   Filed Weights   11/30/23 1900  Weight: 127.8 kg    Examination:  General:  Pleasantly resting in bed, No acute distress. HEENT:  Normocephalic atraumatic.  Sclerae nonicteric, noninjected.  Neck:  Without mass or deformity.  Trachea is midline. Lungs: Diminished bilaterally with diffuse rhonchi, no wheeze or rales noted Heart:  Regular rate and rhythm.  Without murmurs, rubs, or gallops. Abdomen:  Soft, he is, nontender, nondistended.  Without guarding or rebound. Extremities: Without cyanosis, clubbing, edema, or obvious deformity. Skin:  Warm and dry, no erythema.  Data Reviewed: I have personally reviewed following labs and imaging studies  CBC: Recent Labs  Lab 11/26/23 0312 11/28/23 0558 11/30/23 0300 11/30/23 0901 12/01/23 0525  WBC 16.8* 15.5*  16.5* 13.3* 17.5*  NEUTROABS 11.4* 10.4* 12.1*  --   --   HGB 13.2 14.2 15.6 14.0 13.7  HCT 42.7 45.5 49.2 44.0 44.7  MCV 93.4 91.9 91.8 91.1 93.1  PLT 377 402* 431* 384 351   Basic Metabolic Panel: Recent Labs  Lab 11/26/23 0312 11/28/23 0558 11/30/23 0300 11/30/23 0901 12/01/23 0525 12/02/23 0621  NA 136 138 139  --  139 135  K 3.8 4.6 4.6  --  4.2 4.3  CL 101 100 98  --  102 100  CO2 25 25 27   --  26 25  GLUCOSE 117* 117* 146*  --  114* 90  BUN 19 21 24*  --  21 17  CREATININE 0.90 0.93 1.20 0.96 0.98 0.84  CALCIUM  8.9 9.1 9.9  --  9.1 8.9   GFR: Estimated Creatinine Clearance: 112.9 mL/min (by C-G formula based on SCr of 0.84 mg/dL). Liver Function Tests: Recent Labs  Lab 11/30/23 0300  AST 22  ALT 29  ALKPHOS 111  BILITOT 0.3  PROT 7.7  ALBUMIN 4.4   BNP (last 3 results) Recent Labs    11/23/23 2315 11/30/23 0300  PROBNP 85.2 127.0  HbA1C: Recent Labs    11/30/23 1737  HGBA1C 6.0*   CBG: Recent Labs  Lab 12/01/23 0753 12/01/23 1138 12/01/23 1643 12/01/23 2055 12/02/23 0721  GLUCAP 124* 119* 159* 132* 98    Recent Results (from the past 240 hours)  Resp panel by RT-PCR (RSV, Flu A&B, Covid) Anterior Nasal Swab     Status: None   Collection Time: 11/23/23 11:06 PM   Specimen: Anterior Nasal Swab  Result Value Ref Range Status   SARS Coronavirus 2 by RT PCR NEGATIVE NEGATIVE Final    Comment: (NOTE) SARS-CoV-2 target nucleic acids are NOT DETECTED.  The SARS-CoV-2 RNA is generally detectable in upper respiratory specimens during the acute phase of infection. The lowest concentration of SARS-CoV-2 viral copies this assay can detect is 138 copies/mL. A negative result does not preclude SARS-Cov-2 infection and should not be used as the sole basis for treatment or other patient management decisions. A negative result may occur with  improper specimen collection/handling, submission of specimen other than nasopharyngeal swab, presence of  viral mutation(s) within the areas targeted by this assay, and inadequate number of viral copies(<138 copies/mL). A negative result must be combined with clinical observations, patient history, and epidemiological information. The expected result is Negative.  Fact Sheet for Patients:  BloggerCourse.com  Fact Sheet for Healthcare Providers:  SeriousBroker.it  This test is no t yet approved or cleared by the [place]  FDA and  has been authorized for detection and/or diagnosis of SARS-CoV-2 by FDA under an Emergency Use Authorization (EUA). This EUA will remain  in effect (meaning this test can be used) for the duration of the COVID-19 declaration under Section 564(b)(1) of the Act, 21 U.S.C.section 360bbb-3(b)(1), unless the authorization is terminated  or revoked sooner.       Influenza A by PCR NEGATIVE NEGATIVE Final   Influenza B by PCR NEGATIVE NEGATIVE Final    Comment: (NOTE) The Xpert Xpress SARS-CoV-2/FLU/RSV plus assay is intended as an aid in the diagnosis of influenza from Nasopharyngeal swab specimens and should not be used as a sole basis for treatment. Nasal washings and aspirates are unacceptable for Xpert Xpress SARS-CoV-2/FLU/RSV testing.  Fact Sheet for Patients: BloggerCourse.com  Fact Sheet for Healthcare Providers: SeriousBroker.it  This test is not yet approved or cleared by the [place]  FDA and has been authorized for detection and/or diagnosis of SARS-CoV-2 by FDA under an Emergency Use Authorization (EUA). This EUA will remain in effect (meaning this test can be used) for the duration of the COVID-19 declaration under Section 564(b)(1) of the Act, 21 U.S.C. section 360bbb-3(b)(1), unless the authorization is terminated or revoked.     Resp Syncytial Virus by PCR NEGATIVE NEGATIVE Final    Comment: (NOTE) Fact Sheet for  Patients: BloggerCourse.com  Fact Sheet for Healthcare Providers: SeriousBroker.it  This test is not yet approved or cleared by the [place]  FDA and has been authorized for detection and/or diagnosis of SARS-CoV-2 by FDA under an Emergency Use Authorization (EUA). This EUA will remain in effect (meaning this test can be used) for the duration of the COVID-19 declaration under Section 564(b)(1) of the Act, 21 U.S.C. section 360bbb-3(b)(1), unless the authorization is terminated or revoked.  Performed at [facility], [address], [place], [place] [address]   MRSA Next Gen by PCR, Nasal     Status: Abnormal   Collection Time: 11/24/23  4:29 AM   Specimen: Nasal Mucosa; Nasal Swab  Result Value Ref Range Status  MRSA by PCR Next Gen DETECTED (A) NOT DETECTED Final    Comment: RESULT CALLED TO, READ BACK BY AND VERIFIED WITH: CONTRERAS, F. 0907 11/24/23 BY JE (NOTE) The GeneXpert MRSA Assay (FDA approved for NASAL specimens only), is one component of a comprehensive MRSA colonization surveillance program. It is not intended to diagnose MRSA infection nor to guide or monitor treatment for MRSA infections. Test performance is not FDA approved in patients less than 90 years old. Performed at [facility], [address], [place], [place] [address]   Respiratory (~20 pathogens) panel by PCR     Status: None   Collection Time: 11/24/23 12:16 PM   Specimen: Nasopharyngeal Swab; Respiratory  Result Value Ref Range Status   Adenovirus NOT DETECTED NOT DETECTED Final   Coronavirus 229E NOT DETECTED NOT DETECTED Final    Comment: (NOTE) The Coronavirus on the Respiratory Panel, DOES NOT test for the novel  Coronavirus (2019 nCoV)    Coronavirus HKU1 NOT DETECTED NOT DETECTED Final   Coronavirus NL63 NOT DETECTED NOT DETECTED Final   Coronavirus OC43 NOT DETECTED NOT DETECTED Final    Metapneumovirus NOT DETECTED NOT DETECTED Final   Rhinovirus / Enterovirus NOT DETECTED NOT DETECTED Final   Influenza A NOT DETECTED NOT DETECTED Final   Influenza B NOT DETECTED NOT DETECTED Final   Parainfluenza Virus 1 NOT DETECTED NOT DETECTED Final   Parainfluenza Virus 2 NOT DETECTED NOT DETECTED Final   Parainfluenza Virus 3 NOT DETECTED NOT DETECTED Final   Parainfluenza Virus 4 NOT DETECTED NOT DETECTED Final   Respiratory Syncytial Virus NOT DETECTED NOT DETECTED Final   Bordetella pertussis NOT DETECTED NOT DETECTED Final   Bordetella Parapertussis NOT DETECTED NOT DETECTED Final   Chlamydophila pneumoniae NOT DETECTED NOT DETECTED Final   Mycoplasma pneumoniae NOT DETECTED NOT DETECTED Final    Comment: Performed at [facility], [address], [place], [place] [address]  MRSA Next Gen by PCR, Nasal     Status: Abnormal   Collection Time: 11/30/23  6:53 AM   Specimen: Nasal Mucosa; Nasal Swab  Result Value Ref Range Status   MRSA by PCR Next Gen DETECTED (A) NOT DETECTED Final    Comment: RESULT CALLED TO, READ BACK BY AND VERIFIED WITH:  [place], D 11/30/23 1126 AJ (NOTE) The GeneXpert MRSA Assay (FDA approved for NASAL specimens only), is one component of a comprehensive MRSA colonization surveillance program. It is not intended to diagnose MRSA infection nor to guide or monitor treatment for MRSA infections. Test performance is not FDA approved in patients less than 64 years old. Performed at [facility], [address], [place], [place] [address]   Resp panel by RT-PCR (RSV, Flu A&B, Covid) Anterior Nasal Swab     Status: None   Collection Time: 11/30/23  9:00 AM   Specimen: Anterior Nasal Swab  Result Value Ref Range Status   SARS Coronavirus 2 by RT PCR NEGATIVE NEGATIVE Final    Comment: (NOTE) SARS-CoV-2 target nucleic acids are NOT DETECTED.  The SARS-CoV-2 RNA is generally detectable in upper respiratory specimens during the  acute phase of infection. The lowest concentration of SARS-CoV-2 viral copies this assay can detect is 138 copies/mL. A negative result does not preclude SARS-Cov-2 infection and should not be used as the sole basis for treatment or other patient management decisions. A negative result may occur with  improper specimen collection/handling, submission of specimen other than nasopharyngeal swab, presence of viral mutation(s)  within the areas targeted by this assay, and inadequate number of viral copies(<138 copies/mL). A negative result must be combined with clinical observations, patient history, and epidemiological information. The expected result is Negative.  Fact Sheet for Patients:  BloggerCourse.com  Fact Sheet for Healthcare Providers:  SeriousBroker.it  This test is no t yet approved or cleared by the [place]  FDA and  has been authorized for detection and/or diagnosis of SARS-CoV-2 by FDA under an Emergency Use Authorization (EUA). This EUA will remain  in effect (meaning this test can be used) for the duration of the COVID-19 declaration under Section 564(b)(1) of the Act, 21 U.S.C.section 360bbb-3(b)(1), unless the authorization is terminated  or revoked sooner.       Influenza A by PCR NEGATIVE NEGATIVE Final   Influenza B by PCR NEGATIVE NEGATIVE Final    Comment: (NOTE) The Xpert Xpress SARS-CoV-2/FLU/RSV plus assay is intended as an aid in the diagnosis of influenza from Nasopharyngeal swab specimens and should not be used as a sole basis for treatment. Nasal washings and aspirates are unacceptable for Xpert Xpress SARS-CoV-2/FLU/RSV testing.  Fact Sheet for Patients: BloggerCourse.com  Fact Sheet for Healthcare Providers: SeriousBroker.it  This test is not yet approved or cleared by the [place]  FDA and has been authorized for detection and/or  diagnosis of SARS-CoV-2 by FDA under an Emergency Use Authorization (EUA). This EUA will remain in effect (meaning this test can be used) for the duration of the COVID-19 declaration under Section 564(b)(1) of the Act, 21 U.S.C. section 360bbb-3(b)(1), unless the authorization is terminated or revoked.     Resp Syncytial Virus by PCR NEGATIVE NEGATIVE Final    Comment: (NOTE) Fact Sheet for Patients: BloggerCourse.com  Fact Sheet for Healthcare Providers: SeriousBroker.it  This test is not yet approved or cleared by the [place]  FDA and has been authorized for detection and/or diagnosis of SARS-CoV-2 by FDA under an Emergency Use Authorization (EUA). This EUA will remain in effect (meaning this test can be used) for the duration of the COVID-19 declaration under Section 564(b)(1) of the Act, 21 U.S.C. section 360bbb-3(b)(1), unless the authorization is terminated or revoked.  Performed at [facility], [address] [place], [place] [address]   Culture, blood (routine x 2) Call MD if unable to obtain prior to antibiotics being given     Status: None (Preliminary result)   Collection Time: 11/30/23  5:37 PM   Specimen: BLOOD RIGHT HAND  Result Value Ref Range Status   Specimen Description   Final    BLOOD RIGHT HAND Performed at [facility], [address] [place], [place] [address]    Special Requests   Final    BOTTLES DRAWN AEROBIC AND ANAEROBIC Blood Culture adequate volume Performed at [facility], [address] [place], [place] [address]    Culture   Final    NO GROWTH < 24 HOURS Performed at [facility], [address], [place], [place] [address]    Report Status PENDING  Incomplete  Culture, blood (routine x 2) Call MD if unable to obtain prior to antibiotics being given     Status: None (Preliminary result)   Collection Time: 11/30/23  5:41 PM    Specimen: BLOOD LEFT HAND  Result Value Ref Range Status   Specimen Description   Final    BLOOD LEFT HAND Performed at [facility], [address], [place], [place] [address]    Special Requests  Final    BOTTLES DRAWN AEROBIC AND ANAEROBIC Blood Culture adequate volume Performed at [facility], [address] [place], [place] [address]    Culture   Final    NO GROWTH < 24 HOURS Performed at [facility], [address], [place], [place] [address]    Report Status PENDING  Incomplete         Radiology Studies: No results found.       Scheduled Meds:  arformoterol   15 mcg Nebulization BID   And   umeclidinium bromide   1 puff Inhalation Daily   aspirin  EC  81 mg Oral Daily   buPROPion   300 mg Oral q morning   busPIRone   15 mg Oral q AM   cyclobenzaprine   20 mg Oral QHS   cycloSPORINE   1 drop Both Eyes Q12H   diltiazem   120 mg Oral Daily   DULoxetine   60 mg Oral BID   enoxaparin  (LOVENOX ) injection  60 mg Subcutaneous Q24H   fluticasone   2 spray Each Nare Daily   gabapentin   100 mg Oral TID   insulin  aspart  0-15 Units Subcutaneous TID WC   insulin  aspart  0-5 Units Subcutaneous QHS   Oxcarbazepine   900 mg Oral BID   pantoprazole   40 mg Oral Daily   predniSONE   40 mg Oral Q breakfast   tamsulosin   0.8 mg Oral q AM   traZODone   75 mg Oral QHS   Continuous Infusions:  ceFEPime  (MAXIPIME ) IV 2 g (12/02/23 0614)   vancomycin  2,000 mg (12/01/23 2205)     LOS: 2 days   Time spent:  Elsie JAYSON [place], DO [facility]  If 7PM-7AM, please contact night-coverage [email or web address]  12/02/2023, 7:45 AM

## 2023-12-02 NOTE — Progress Notes (Signed)
 PHARMACY NOTE:  ANTIMICROBIAL RENAL DOSAGE ADJUSTMENT  Current antimicrobial regimen includes a mismatch between antimicrobial dosage and estimated renal function.  As per policy approved by the Pharmacy & Therapeutics and Medical Executive Committees, the antimicrobial dosage will be adjusted accordingly.  Current antimicrobial dosage: vancomycin  2000 mg q24h  Indication: pneumonia  Renal Function:  Estimated Creatinine Clearance: 112.9 mL/min (by C-G formula based on SCr of 0.84 mg/dL).     Antimicrobial dosage has been changed to:  vancomycin  1250 mg q12h  Thank you for allowing pharmacy to be a part of this patient's care.  Stefano MARLA Bologna, PharmD, BCPS Clinical Pharmacist 12/02/2023 9:31 AM

## 2023-12-02 NOTE — Plan of Care (Signed)
  Problem: Education: Goal: Ability to describe self-care measures that may prevent or decrease complications (Diabetes Survival Skills Education) will improve Outcome: Progressing   Problem: Coping: Goal: Ability to adjust to condition or change in health will improve Outcome: Progressing   Problem: Fluid Volume: Goal: Ability to maintain a balanced intake and output will improve Outcome: Progressing   Problem: Metabolic: Goal: Ability to maintain appropriate glucose levels will improve Outcome: Progressing   Problem: Nutritional: Goal: Maintenance of adequate nutrition will improve Outcome: Progressing   Problem: Education: Goal: Knowledge of General Education information will improve Description: Including pain rating scale, medication(s)/side effects and non-pharmacologic comfort measures Outcome: Progressing   Problem: Clinical Measurements: Goal: Ability to maintain clinical measurements within normal limits will improve Outcome: Progressing Goal: Respiratory complications will improve Outcome: Progressing   Problem: Activity: Goal: Risk for activity intolerance will decrease Outcome: Progressing   Problem: Activity: Goal: Ability to tolerate increased activity will improve Outcome: Progressing   Problem: Respiratory: Goal: Ability to maintain adequate ventilation will improve Outcome: Progressing Goal: Ability to maintain a clear airway will improve Outcome: Progressing

## 2023-12-02 NOTE — Progress Notes (Signed)
   12/02/23 2013  BiPAP/CPAP/SIPAP  BiPAP/CPAP/SIPAP Pt Type Adult (prefers self placement)  BiPAP/CPAP/SIPAP Resmed  Mask Type Full face mask  Dentures removed? Not applicable  Mask Size Large  EPAP 9 cmH2O  Flow Rate 8 lpm  Heater Temperature  (Humidifier filled to full line with sterile water)  Patient Home Machine No  Patient Home Mask Yes  Patient Home Tubing No  Auto Titrate No  CPAP/SIPAP surface wiped down Yes  Device Plugged into RED Power Outlet Yes  BiPAP/CPAP /SiPAP Vitals  Pulse Rate (!) 103  Resp 20  SpO2 93 %  Bilateral Breath Sounds Clear;Diminished  MEWS Score/Color  MEWS Score 1  MEWS Score Color Green

## 2023-12-02 NOTE — Consult Note (Signed)
 [facility]   HEMATOLOGY/ONCOLOGY CONSULTATION NOTE  Date of Service: 12/02/2023  Patient Care Team: Clinic, [name] as PCP - General [name], MD as Consulting Physician (Oncology)  CHIEF COMPLAINTS/PURPOSE OF CONSULTATION:  Metastatic non-small cell lung cancer with K-ras G12C mutation  HISTORY OF PRESENTING ILLNESS:   Mario [name] is a wonderful 73 y.o. male who has been referred to us  by Dr Lue for evaluation and management of metastatic lung cancer to help define treatment options and goals of care. Patient has a history of severe COPD and is oxygen  dependent with nasal cannula oxygen  at 6 to 8 L/min at baseline at home.  And is currently on 6 L/min at rest. He has been admitted for worsening shortness of breath and dropping oxygen  levels with CTA of the chest showing possible multifocal pneumonia.   Patient has been previously seen by Dr. Gatha for his lung cancer as outpatient.  But has been lost to follow-up multiple times.  He also had suggestive [facility] but did not continue follow-up there either. He notes his follow-up issues have resulted due to lack of transportation and limited social support as well as other anxiety issues.  He was initially diagnosed with stage IIb non-small cell lung adenocarcinoma in June 2022 presenting as a left lower lobe lung mass treated with SBRT.  In April 2023 he was noted to have suspicious pleural-based nodules and a small left pleural effusion for which she underwent ultrasound-guided thoracentesis which showed malignant cells consistent with adenocarcinoma resulting in a diagnosis of metastatic disease. Molecular studies at the time showed positive KRAS G12C mutation and PD-L1 expression of 40%.  He was last seen when hospitalized in March 2025 by Dr. [name] .  Goals of care discussion at that time suggested consideration of best supportive care through hospice given his poor  performance status and limited lung function.  He was also alternatively given an option for palliative systemic therapy with carboplatin Alimta pembrolizumab.   Patient reports that he has had weird kind of vasculitis for which she was seen at [facility] and does not feel he can have immunotherapy. I had a detailed goals of care discussion with him and he is still keen to  not just give up and die. He shows me photos of the young girl who he mentions as a model that he met on Facebook.  He notes that he has a meaningful relationship with her on text messaging but has never met this individual. He notes that this makes him want to keep living. I discussed with him to ensure that he is not getting scammed for life insurance or with his personal details for financial fraud. Patient notes that he is still keen to follow-up with Dr. [name] as outpatient to discuss treatment options though he has not followed up over the last 6 months since he was last hospitalized. He notes that he was also not seen at the [facility] oncology.   MEDICAL HISTORY:  Past Medical History:  Diagnosis Date   Ankylosing spondylitis lumbar region [facility]) 09/25/2022   Anxiety    Bronchitis    COPD (chronic obstructive pulmonary disease) (HCC)    Depression    History of falling 07/29/2021   history of Pulmonary embolism (HCC) 07/02/2022   History of radiation therapy    Left lung- 10/05/20-10/15/20- Dr. Lynwood Nasuti   Hypertension    lung ca 09/2020   MI (myocardial infarction) [facility]    ????  On home oxygen  therapy    4L/min [place]   OSA (obstructive sleep apnea)    Suicidal ideation 06/23/2019   Suicide attempt (HCC)    Tension pneumothorax 06/27/2016   Uveitis     SURGICAL HISTORY: Past Surgical History:  Procedure Laterality Date   BIOPSY  07/03/2021   Procedure: BIOPSY;  Surgeon: Elicia Claw, MD;  Location: [facility] ENDOSCOPY;  Service: Gastroenterology;;   BRONCHIAL BIOPSY  07/30/2020   Procedure: BRONCHIAL BIOPSIES;   Surgeon: Brenna Adine CROME, DO;  Location: [facility] ENDOSCOPY;  Service: Pulmonary;;   BRONCHIAL BRUSHINGS  07/30/2020   Procedure: BRONCHIAL BRUSHINGS;  Surgeon: Brenna Adine CROME, DO;  Location: [facility] ENDOSCOPY;  Service: Pulmonary;;   BRONCHIAL NEEDLE ASPIRATION BIOPSY  07/30/2020   Procedure: BRONCHIAL NEEDLE ASPIRATION BIOPSIES;  Surgeon: Brenna Adine CROME, DO;  Location: [facility] ENDOSCOPY;  Service: Pulmonary;;   BRONCHIAL WASHINGS  07/30/2020   Procedure: BRONCHIAL WASHINGS;  Surgeon: Brenna Adine CROME, DO;  Location: [facility] ENDOSCOPY;  Service: Pulmonary;;   BRONCHIAL WASHINGS  04/17/2023   Procedure: BRONCHIAL WASHINGS;  Surgeon: Claudene Toribio BROCKS, MD;  Location: [facility] ENDOSCOPY;  Service: Pulmonary;;   CHEST TUBE INSERTION Left 06/27/2016   CRYOTHERAPY  04/17/2023   Procedure: CRYOTHERAPY;  Surgeon: Claudene Toribio BROCKS, MD;  Location: [facility] ENDOSCOPY;  Service: Pulmonary;;   cryptorchidism     ESOPHAGOGASTRODUODENOSCOPY N/A 07/03/2021   Procedure: ESOPHAGOGASTRODUODENOSCOPY (EGD);  Surgeon: Elicia Claw, MD;  Location: [facility];  Service: Gastroenterology;  Laterality: N/A;   HEMOSTASIS CONTROL  04/17/2023   Procedure: HEMOSTASIS CONTROL;  Surgeon: Claudene Toribio BROCKS, MD;  Location: [facility] ENDOSCOPY;  Service: Pulmonary;;   IR PERC PLEURAL DRAIN W/INDWELL CATH W/IMG GUIDE  08/04/2021   IR REMOVAL OF PLURAL CATH W/CUFF  09/01/2021   SKIN CANCER EXCISION     VIDEO BRONCHOSCOPY Left 04/17/2023   Procedure: VIDEO BRONCHOSCOPY WITHOUT FLUORO;  Surgeon: Claudene Toribio BROCKS, MD;  Location: [facility] ENDOSCOPY;  Service: Pulmonary;  Laterality: Left;   VIDEO BRONCHOSCOPY WITH ENDOBRONCHIAL NAVIGATION Left 07/30/2020   Procedure: VIDEO BRONCHOSCOPY WITH ENDOBRONCHIAL NAVIGATION;  Surgeon: Brenna Adine CROME, DO;  Location: [facility] ENDOSCOPY;  Service: Pulmonary;  Laterality: Left;    SOCIAL HISTORY: Social History   Socioeconomic History   Marital status: Single    Spouse name: Not on file   Number of children: Not on file   Years of education: Not  on file   Highest education level: Not on file  Occupational History   Occupation: retired  Tobacco Use   Smoking status: Former    Current packs/day: 0.00    Average packs/day: 1 pack/day for 35.0 years (35.0 ttl pk-yrs)    Types: Cigarettes    Start date: 05/1981    Quit date: 05/2016    Years since quitting: 7.5    Passive exposure: Past   Smokeless tobacco: Never  Vaping Use   Vaping status: Never Used  Substance and Sexual Activity   Alcohol  use: No    Alcohol /week: 0.0 standard drinks of alcohol     Comment: denies use of any drugs or alcohol    Drug use: No   Sexual activity: Not Currently  Other Topics Concern   Not on file  Social History Narrative   Not on file   Social Drivers of Health   Financial Resource Strain: Low Risk  (09/23/2020)   Overall Financial Resource Strain (CARDIA)    Difficulty of Paying Living Expenses: Not hard at all  Food Insecurity: No Food Insecurity (11/30/2023)   Hunger Vital Sign  Worried About Programme researcher, broadcasting/film/video in the Last Year: Never true    Ran Out of Food in the Last Year: Never true  Transportation Needs: Unmet Transportation Needs (11/30/2023)   PRAPARE - Administrator, Civil Service (Medical): Yes    Lack of Transportation (Non-Medical): Yes  Physical Activity: Not on file  Stress: No Stress Concern Present (09/23/2020)   [employer] of Occupational Health - Occupational Stress Questionnaire    Feeling of Stress : Not at all  Social Connections: Socially Isolated (11/30/2023)   Social Connection and Isolation Panel    Frequency of Communication with Friends and Family: More than three times a week    Frequency of Social Gatherings with Friends and Family: More than three times a week    Attends Religious Services: Never    Database administrator or Organizations: No    Attends Banker Meetings: Never    Marital Status: Divorced  Catering manager Violence: Not At Risk (11/30/2023)    Humiliation, Afraid, Rape, and Kick questionnaire    Fear of Current or Ex-Partner: No    Emotionally Abused: No    Physically Abused: No    Sexually Abused: No    FAMILY HISTORY: Family History  Problem Relation Age of Onset   Dementia Father     ALLERGIES:  is allergic to beet [beta vulgaris], demerol [meperidine], zocor [simvastatin], and liver.  MEDICATIONS:  Current Facility-Administered Medications  Medication Dose Route Frequency Provider Last Rate Last Admin   arformoterol  (BROVANA ) nebulizer solution 15 mcg  15 mcg Nebulization BID Lue Elsie BROCKS, MD   15 mcg at 12/02/23 0830   And   umeclidinium bromide  (INCRUSE ELLIPTA ) 62.5 MCG/ACT 1 puff  1 puff Inhalation Daily Lue Elsie BROCKS, MD   1 puff at 12/02/23 0830   artificial tears ophthalmic solution 1 drop  1 drop Both Eyes PRN Lue Elsie BROCKS, MD       aspirin  EC tablet 81 mg  81 mg Oral Daily Pahwani, Rinka R, MD   81 mg at 12/02/23 9081   buPROPion  (WELLBUTRIN  XL) 24 hr tablet 300 mg  300 mg Oral q morning Pahwani, Rinka R, MD   300 mg at 12/02/23 9081   busPIRone  (BUSPAR ) tablet 15 mg  15 mg Oral q AM Pahwani, Rinka R, MD   15 mg at 12/02/23 9386   ceFEPIme  (MAXIPIME ) 2 g in sodium chloride  0.9 % 100 mL IVPB  2 g Intravenous Q8H Hall, Carole N, DO 200 mL/hr at 12/02/23 0614 2 g at 12/02/23 9385   cyclobenzaprine  (FLEXERIL ) tablet 20 mg  20 mg Oral QHS Pahwani, Rinka R, MD   20 mg at 12/01/23 2107   cycloSPORINE  (RESTASIS ) 0.05 % ophthalmic emulsion 1 drop  1 drop Both Eyes Q12H Lue Elsie BROCKS, MD   1 drop at 12/02/23 0919   diltiazem  (CARDIZEM  CD) 24 hr capsule 120 mg  120 mg Oral Daily Pahwani, Rinka R, MD   120 mg at 12/02/23 0918   DULoxetine  (CYMBALTA ) DR capsule 60 mg  60 mg Oral BID Pahwani, Rinka R, MD   60 mg at 12/02/23 0924   enoxaparin  (LOVENOX ) injection 60 mg  60 mg Subcutaneous Q24H Mark Bard LABOR, RPH   60 mg at 12/01/23 2107   fluticasone  (FLONASE ) 50 MCG/ACT nasal spray 2 spray  2  spray Each Nare Daily Lue Elsie BROCKS, MD   2 spray at 12/02/23 0920   gabapentin  (NEURONTIN ) capsule 100 mg  100 mg Oral TID Pahwani, Rinka R, MD   100 mg at 12/02/23 0918   guaiFENesin  (ROBITUSSIN) 100 MG/5ML liquid 5 mL  5 mL Oral Q4H PRN Lue Elsie BROCKS, MD       HYDROcodone -acetaminophen  (NORCO) 10-325 MG per tablet 1 tablet  1 tablet Oral Q6H PRN Pahwani, Rinka R, MD   1 tablet at 12/02/23 9380   insulin  aspart (novoLOG ) injection 0-15 Units  0-15 Units Subcutaneous TID WC Pahwani, Rinka R, MD   3 Units at 12/01/23 1656   insulin  aspart (novoLOG ) injection 0-5 Units  0-5 Units Subcutaneous QHS Pahwani, Rinka R, MD       ipratropium-albuterol  (DUONEB) 0.5-2.5 (3) MG/3ML nebulizer solution 3 mL  3 mL Nebulization Q8H PRN Lue Elsie BROCKS, MD       lactulose  (CHRONULAC ) 10 GM/15ML solution 20 g  20 g Oral BID PRN Lue Elsie BROCKS, MD       Oxcarbazepine  (TRILEPTAL ) tablet 900 mg  900 mg Oral BID Pahwani, Rinka R, MD   900 mg at 12/02/23 9081   pantoprazole  (PROTONIX ) EC tablet 40 mg  40 mg Oral Daily Pahwani, Rinka R, MD   40 mg at 12/02/23 9081   predniSONE  (DELTASONE ) tablet 40 mg  40 mg Oral Q breakfast Lue Elsie BROCKS, MD   40 mg at 12/02/23 9081   sodium chloride  (OCEAN) 0.65 % nasal spray 1 spray  1 spray Each Nare PRN Lue Elsie BROCKS, MD   1 spray at 12/01/23 2206   tamsulosin  (FLOMAX ) capsule 0.8 mg  0.8 mg Oral q AM Pahwani, Rinka R, MD   0.8 mg at 12/02/23 9386   traZODone  (DESYREL ) tablet 75 mg  75 mg Oral QHS Pahwani, Rinka R, MD   75 mg at 12/01/23 2107   vancomycin  (VANCOREADY) IVPB 1250 mg/250 mL  1,250 mg Intravenous Q12H Ellington, Abby K, RPH        REVIEW OF SYSTEMS:   10 Point review of Systems was done is negative except as noted above.  PHYSICAL EXAMINATION: ECOG PERFORMANCE STATUS: 3 - Symptomatic, >50% confined to bed  . Vitals:   12/02/23 0830 12/02/23 0916  BP:  137/73  Pulse:  96  Resp:    Temp:    SpO2: 97% 93%   Filed Weights    11/30/23 1900  Weight: 281 lb 12 oz (127.8 kg)   .Body mass index is 36.17 kg/m.  GENERAL:alert, on sleep 6 L of oxygen  by nasal cannula with mild respiratory distress with any movement in bed OROPHARYNX: MMM, no exudates, no oropharyngeal erythema or ulceration NECK: supple, no JVD LYMPH:  no palpable lymphadenopathy in the cervical, axillary or inguinal regions LUNGS: clear to auscultation b/l with normal respiratory effort HEART: regular rate & rhythm ABDOMEN:  normoactive bowel sounds , non tender, not distended. Extremity: no pedal edema PSYCH: alert & oriented x 3 with fluent speech NEURO: no focal motor/sensory deficits  LABORATORY DATA:  I have reviewed the data as listed  .    Latest Ref Rng & Units 12/02/2023    8:59 AM 12/01/2023    5:25 AM 11/30/2023    9:01 AM  CBC  WBC 4.0 - 10.5 K/uL 12.5  17.5  13.3   Hemoglobin 13.0 - 17.0 g/dL 86.5  86.2  85.9   Hematocrit 39.0 - 52.0 % 42.6  44.7  44.0   Platelets 150 - 400 K/uL 336  351  384     .    Latest Ref Rng &  Units 12/02/2023    6:21 AM 12/01/2023    5:25 AM 11/30/2023    9:01 AM  CMP  Glucose 70 - 99 mg/dL 90  885    BUN 8 - 23 mg/dL 17  21    Creatinine 9.38 - 1.24 mg/dL 9.15  9.01  9.03   Sodium 135 - 145 mmol/L 135  139    Potassium 3.5 - 5.1 mmol/L 4.3  4.2    Chloride 98 - 111 mmol/L 100  102    CO2 22 - 32 mmol/L 25  26    Calcium  8.9 - 10.3 mg/dL 8.9  9.1       RADIOGRAPHIC STUDIES: I have personally reviewed the radiological images as listed and agreed with the findings in the report. CT Angio Chest PE W/Cm &/Or Wo Cm Result Date: 11/30/2023 EXAM: CTA CHEST 11/30/2023 05:22:30 AM TECHNIQUE: CTA of the chest was performed without and with the administration of 100 mL iohexol  (OMNIPAQUE ) 350 MG/ML injection. Multiplanar reformatted images are provided for review. MIP images are provided for review. Automated exposure control, iterative reconstruction, and/or weight based adjustment of the mA/kV was  utilized to reduce the radiation dose to as low as reasonably achievable. COMPARISON: CT angiogram of the chest dated 07/20/2023. CLINICAL HISTORY: Pulmonary embolism (PE) suspected, high prob. Increased SOB, recent admission for COPD exacerbation, wbc's 16.5, GFR>60, hx of lung ca 09/2020, radiation to left lung; Port cxr prior to ct; 100 omni 350. FINDINGS: PULMONARY ARTERIES: Pulmonary arteries are adequately opacified for evaluation. No acute pulmonary embolus. Main pulmonary artery is normal in caliber. MEDIASTINUM: There is moderate calcific coronary artery disease. The pericardium demonstrates no acute abnormality. The thoracic aorta also demonstrates moderate calcific atheromatous disease. LYMPH NODES: There are numerous shotty mediastinal nodes, as before. No hilar or axillary lymphadenopathy. LUNGS AND PLEURA: There is moderate-to-severe central lobular emphysema present. There is extensive patchy opacification/consolidation of the lower lobes bilaterally and of the lingula. There are also patchy neuro bronchovascular nodular opacities within the right middle lobe. No evidence of pleural effusion or pneumothorax. UPPER ABDOMEN: Simple-appearing exophytic cysts arising from the kidneys bilaterally. The abdominal aorta demonstrates moderate calcific atheromatous disease. SOFT TISSUES AND BONES: No acute bone or soft tissue abnormality. IMPRESSION: 1. No evidence of pulmonary embolism. 2. Extensive multifocal pneumonia involving the lower lobes bilaterally, lingula, and right middle lobe. 3. Moderate-to-severe centrilobular emphysema. Electronically signed by: Evalene Coho MD 11/30/2023 05:50 AM EDT RP Workstation: [id number]   DG Chest Port 1 View Result Date: 11/30/2023 EXAM: 1 VIEW(S) XRAY OF THE CHEST 11/30/2023 03:07:00 AM COMPARISON: 11/23/2023 CLINICAL HISTORY: sob. Shortness of breath FINDINGS: LUNGS AND PLEURA: Patchy airspace opacities throughout left lung. Right lower lung zone airspace  opacity. Small bilateral pleural effusions. No pulmonary edema. No pneumothorax. HEART AND MEDIASTINUM: No acute abnormality of the cardiac and mediastinal silhouettes. BONES AND SOFT TISSUES: No acute osseous abnormality. IMPRESSION: 1. Patchy airspace opacities throughout the left lung and in the right lower lung similar to 11/23/2023. 2. Small bilateral pleural effusions. Electronically signed by: Norman Gatlin MD 11/30/2023 03:19 AM EDT RP Workstation: [id number]   DG Chest Port 1 View Result Date: 11/23/2023 EXAM: 1 VIEW(S) XRAY OF THE CHEST 11/23/2023 11:24:06 PM COMPARISON: CTA chest dated 07/20/2023. CLINICAL HISTORY: shortness of breath. Per chart - c/o SOB for the past 3-4 days. Per EMS, pts initial oxygen  saturation was 88% on his baseline 8L [place]. Pt was given one Duoneb treatment with some relief. On arrival, labored breathing noted with  retractions and pt is unable to speak in full ; sentences with out taking a breath. Pt denies any recent sick contacts, chest pain, fevers, chills, n/v/d. Dr. Cottie at bedside and respiratory notified FINDINGS: LUNGS AND PLEURA: Stable multifocal opacities are present in the left mid/lower lung and right lower lung. While this has previously been considered suspicious for pneumonia, given the chronic appearance, multifocal semi-invasive peripheral adenocarcinoma (formally, bronchoalveolar cell carcinoma) should be considered. Associated small bilateral pleural effusions, chronic. No pulmonary edema. No pneumothorax. HEART AND MEDIASTINUM: No acute abnormality of the cardiac and mediastinal silhouettes. BONES AND SOFT TISSUES: No acute osseous abnormality. IMPRESSION: 1. Stable multifocal opacities in the left mid/lower lung and right lower lung. Given chronic appearance, multifocal semi-invasive peripheral adenocarcinoma should be considered. 2. Small bilateral pleural effusions, chronic. Electronically signed by: Pinkie Pebbles MD 11/23/2023 11:29 PM EDT RP  Workstation: [id number]    ASSESSMENT & PLAN:   73 year old male with metastatic lung adenocarcinoma admitted with possible multilobar pneumonia versus worsening malignancy. Per outpatient oncology follow-up with multiple attempts at failed follow-up both at the [facility] health cancer center as well as that at the [place].  #1 metastatic lung adenocarcinoma with K-ras G 12C mutation #2 poor functional status ECOG performance status of at least 3.  Very limited lung capacity with requiring 6 to 8 L of nasal cannula oxygen . #3 concern for multifocal pneumonia #4 history of mood disorder/depression.  Previous history of suicidal ideation. #5 limited social support.  Lives in a rented room.  Has about 15 hours of Senior care services at home per his report. Plan I had a detailed goals of care discussion with the patient. We discussed that he has incurable disease from his metastatic lung cancer which was noted to have malignant pleural effusion. It is unclear on the CT of the chest if this is multilobar pneumonia or if there is lymphangitic spread of cancer and underlying his pneumonic consolidation.  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 We discussed that given his performance status is 3 generally would be would recommend best supportive cares through hospice and that systemic chemoimmunotherapy options will likely not be tolerated well and may not necessarily buy him more time.  Aggressive palliative treatment options are likely to increased risk of infections hospitalizations and worsening energy levels with decreased independence. He notes that he was told he could take a pill/targeted therapy.  We discussed that though there is targeted therapy that is available for his adenocarcinoma with K-ras G 12C mutation these treatments are often done in a second line setting.  However we would defer this decision to his primary oncologist Dr. Gatha. - He notes that he wants  to consider palliative treatment options.  Especially since he has met a friend who is a model on Facebook and has been communicating with her every day.  He was warned about the concerns that this could be a financial or life insurance scam and to be careful with this. - CODE STATUS has been addressed by the hospitalist and is currently -Do not attempt resuscitation (DNR) -DNR-LIMITED -Do Not Intubate/DNI - Set by Vernon Velna SAUNDERS, MD at 11/30/2023 [phone]  - Will let Dr. Gatha know so that he can be set up for outpatient follow-up. - If his clinical status worsens would consider palliative care and might need more proactive consideration of best supportive cares through hospice.  Appreciate excellent hospital medicine care by Dr. Lue.  The total time spent  in the appointment was 60 minutes*.  All of the patient's questions were answered with apparent satisfaction. The patient knows to call the clinic with any problems, questions or concerns.   Emaline Saran MD MS AAHIVMS [facility] Hematology/Oncology Physician [facility]  .*Total Encounter Time as defined by the Centers for Medicare and Medicaid Services includes, in addition to the face-to-face time of a patient visit (documented in the note above) non-face-to-face time: obtaining and reviewing outside history, ordering and reviewing medications, tests or procedures, care coordination (communications with other health care professionals or caregivers) and documentation in the medical record.   12/02/2023 12:10 PM

## 2023-12-03 DIAGNOSIS — J188 Other pneumonia, unspecified organism: Secondary | ICD-10-CM | POA: Diagnosis not present

## 2023-12-03 LAB — CBC
HCT: 41.3 % (ref 39.0–52.0)
Hemoglobin: 12.7 g/dL — ABNORMAL LOW (ref 13.0–17.0)
MCH: 28.3 pg (ref 26.0–34.0)
MCHC: 30.8 g/dL (ref 30.0–36.0)
MCV: 92 fL (ref 80.0–100.0)
Platelets: 320 K/uL (ref 150–400)
RBC: 4.49 MIL/uL (ref 4.22–5.81)
RDW: 15 % (ref 11.5–15.5)
WBC: 12.4 K/uL — ABNORMAL HIGH (ref 4.0–10.5)
nRBC: 0 % (ref 0.0–0.2)

## 2023-12-03 LAB — BASIC METABOLIC PANEL WITH GFR
Anion gap: 10 (ref 5–15)
BUN: 15 mg/dL (ref 8–23)
CO2: 26 mmol/L (ref 22–32)
Calcium: 8.8 mg/dL — ABNORMAL LOW (ref 8.9–10.3)
Chloride: 100 mmol/L (ref 98–111)
Creatinine, Ser: 0.85 mg/dL (ref 0.61–1.24)
GFR, Estimated: >60 mL/min (ref >60)
Glucose, Bld: 118 mg/dL — ABNORMAL HIGH (ref 70–99)
Potassium: 3.9 mmol/L (ref 3.5–5.1)
Sodium: 136 mmol/L (ref 135–145)

## 2023-12-03 LAB — GLUCOSE, CAPILLARY
Glucose-Capillary: 106 mg/dL — ABNORMAL HIGH (ref 70–99)
Glucose-Capillary: 192 mg/dL — ABNORMAL HIGH (ref 70–99)
Glucose-Capillary: 155 mg/dL — ABNORMAL HIGH (ref 70–99)
Glucose-Capillary: 161 mg/dL — ABNORMAL HIGH (ref 70–99)

## 2023-12-03 NOTE — Progress Notes (Deleted)
 SATURATION QUALIFICATIONS: (This note is used to comply with regulatory documentation for home oxygen )  Patient Saturations on Room Air at Rest = 88%  Patient Saturations on Room Air while Ambulating = did not test  Patient Saturations on 8 Liters of oxygen  while Ambulating = 93%  Please briefly explain why patient needs home oxygen :

## 2023-12-03 NOTE — Progress Notes (Signed)
 PROGRESS NOTE    Mario [name]  FMW:[id number] DOB: 1950-12-29 DOA: 11/30/2023 PCP: [name]   Brief Narrative:  Mario [name] is a 73 y.o. male with medical history significant of ankylosing spondylitis, chronic hypoxemic respiratory failure in the setting of COPD on 8 L of oxygen  via nasal cannulae at home, depression with prior history of suicide attempt, left lung cancer s/p radiation therapy, hypertension, OSA, obesity, type 2 diabetes, CKD stage III A presented with worsening shortness of breath.  Patient had bronchoscopy with biopsy this past spring concerning for adenocarcinoma.  He was scheduled to follow-up locally with Dr. [name] but [facility] did not show up to his appointment there and appears to have been lost to follow-up.  Patient was just seen in our facility and discharged on 9/30 for presumed COPD exacerbation, he appears to have been noncompliant with medications at home and reports back to our facility on 10/2 for worsening hypoxia from baseline.  Assessment & Plan:   Active Problems:   Goals of care, counseling/discussion   Multifocal pneumonia   Metastatic non-small cell lung cancer (HCC)  Acute on chronic hypoxemic respiratory failure in the setting of multifocal pneumonia Rule out worsening lung cancer Hemoptysis, transient -Patient recently admitted and discharged on 9/30 for COPD exacerbation  -Continue steroid taper -Continues to require oxygen  above prior baseline (8L) - now on 10L to keep sats >88 -Markedly abnormal CTA concerning for multifocal pneumonia, upon further evaluation and discussion with pulmonology his left lung changes appear to be secondary to prior radiation from lung cancer and right sided infiltrate is concerning for possible new malignancy given duration as it was present on imaging earlier this year as well as previously last year. - Patient was to be evaluated locally  by oncology for possible systemic treatment but declined and requested transfer of care and follow up at the [facility], it is unclear whether or not he underwent any treatment or evaluation at their facility. **No show note on May 13 at the [facility]- no notes after this date** - Given abnormal CTA here concerning for possible infiltrate will treat patient for multifocal pneumonia/HCAP given recent admission with cefepime  vancomycin ; Patient does not appear septic, does not meet sepsis criteria, remains afebrile -mild leukocytosis in the setting of recent steroid use - low threshold to discontinue antibiotics - Baseline oxygen  at this point is 8 L nasal cannula at home, he is minimally active due to profound dyspnea -Viral panel negative, cultures remain pending -Previous history of hemoptysis, continue to follow clinically - hold anticoagulation - Patient to follow up outpatient with Dr [name] after discharge.   Presumed recurrent lung cancer, POA - See above, oncology consulted for further insight and recommendations (patient previously refusing systemic treatment) - Unclear at this time whether or not patient has a clear avenue for treatment moving forward, will likely need to involve palliative care in the interim  Elevated troponin: - Likely type II, in the setting of hypoxia.   Prior history of left lung cancer - Status post radiation in the past.  Followed by pulmonology at [facility]   Ankylosing spondylitis - On Flexeril , gabapentin  and Norco-Will continue same   Depression with prior history of suicide attempt: - Continue bupropion , buspirone , duloxetine , oxcarbazepine  and trazodone    Hypertension: On diltiazem .  Will continue same   BPH: Continue Flomax    Type 2 diabetes: A1c 6.7.  Uncontrolled with hyperglycemia - On metformin  at  home.  Hold for now.  Start sliding scale insulin    Stage III CKD: - Stable at baseline Morbid obesity with BMI of 36 - Associated with hypertension, hyperlipidemia    Thrombocytosis: Likely reactive.  Will continue to monitor  DVT prophylaxis: SCDs Start: 11/30/23 0901 hold chemical prophylaxis in the setting of hemoptysis Code Status:   Code Status: Limited: Do not attempt resuscitation (DNR) -DNR-LIMITED -Do Not Intubate/DNI  Family Communication: None present  Status is: Inpatient  Dispo: The patient is from: Home              Anticipated d/c is to: To be determined              Anticipated d/c date is: To be determined              Patient currently not medically stable for discharge  Consultants:  Oncology  Procedures:  None  Antimicrobials:  Cefepime , vancomycin   Subjective: No acute issues or events overnight denies chest pain fevers chills nausea vomiting diarrhea constipation.  Shortness of breath ongoing with no real improvement, worsening cough with noted scant hemoptysis overnight as well.  Objective: Vitals:   12/02/23 1935 12/02/23 2012 12/02/23 2013 12/03/23 0437  BP: 132/72   119/67  Pulse: (!) 103  (!) 103 86  Resp: 20  20 20   Temp: 97.8 F (36.6 C)   97.6 F (36.4 C)  TempSrc: Oral   Oral  SpO2: 91% 93% 93% 94%  Weight:      Height:        Intake/Output Summary (Last 24 hours) at 12/03/2023 0733 Last data filed at 12/03/2023 9356 Gross per 24 hour  Intake 670 ml  Output 2100 ml  Net -1430 ml   Filed Weights   11/30/23 1900  Weight: 127.8 kg    Examination:  General:  Pleasantly resting in bed, No acute distress. HEENT:  Normocephalic atraumatic.  Sclerae nonicteric, noninjected.  Neck:  Without mass or deformity.  Trachea is midline. Lungs: Diminished bilaterally with diffuse rhonchi, no wheeze or rales noted Heart:  Regular rate and rhythm.  Without murmurs, rubs, or gallops. Abdomen:  Soft, he is, nontender, nondistended.  Without guarding or rebound. Extremities: Without cyanosis, clubbing, edema, or obvious deformity. Skin:  Warm and dry, no erythema.  Data Reviewed: I have personally reviewed  following labs and imaging studies  CBC: Recent Labs  Lab 11/28/23 0558 11/30/23 0300 11/30/23 0901 12/01/23 0525 12/02/23 0859 12/03/23 0533  WBC 15.5* 16.5* 13.3* 17.5* 12.5* 12.4*  NEUTROABS 10.4* 12.1*  --   --   --   --   HGB 14.2 15.6 14.0 13.7 13.4 12.7*  HCT 45.5 49.2 44.0 44.7 42.6 41.3  MCV 91.9 91.8 91.1 93.1 93.4 92.0  PLT 402* 431* 384 351 336 320   Basic Metabolic Panel: Recent Labs  Lab 11/28/23 0558 11/30/23 0300 11/30/23 0901 12/01/23 0525 12/02/23 0621 12/03/23 0533  NA 138 139  --  139 135 136  K 4.6 4.6  --  4.2 4.3 3.9  CL 100 98  --  102 100 100  CO2 25 27  --  26 25 26   GLUCOSE 117* 146*  --  114* 90 118*  BUN 21 24*  --  21 17 15   CREATININE 0.93 1.20 0.96 0.98 0.84 0.85  CALCIUM  9.1 9.9  --  9.1 8.9 8.8*   GFR: Estimated Creatinine Clearance: 111.6 mL/min (by C-G formula based on SCr of 0.85 mg/dL). Liver Function Tests: Recent  Labs  Lab 11/30/23 0300  AST 22  ALT 29  ALKPHOS 111  BILITOT 0.3  PROT 7.7  ALBUMIN 4.4   BNP (last 3 results) Recent Labs    11/23/23 2315 11/30/23 0300  PROBNP 85.2 127.0   HbA1C: Recent Labs    11/30/23 1737  HGBA1C 6.0*   CBG: Recent Labs  Lab 12/01/23 2055 12/02/23 0721 12/02/23 1157 12/02/23 1640 12/02/23 2105  GLUCAP 132* 98 117* 149* 195*    Recent Results (from the past 240 hours)  Resp panel by RT-PCR (RSV, Flu A&B, Covid) Anterior Nasal Swab     Status: None   Collection Time: 11/23/23 11:06 PM   Specimen: Anterior Nasal Swab  Result Value Ref Range Status   SARS Coronavirus 2 by RT PCR NEGATIVE NEGATIVE Final    Comment: (NOTE) SARS-CoV-2 target nucleic acids are NOT DETECTED.  The SARS-CoV-2 RNA is generally detectable in upper respiratory specimens during the acute phase of infection. The lowest concentration of SARS-CoV-2 viral copies this assay can detect is 138 copies/mL. A negative result does not preclude SARS-Cov-2 infection and should not be used as the sole  basis for treatment or other patient management decisions. A negative result may occur with  improper specimen collection/handling, submission of specimen other than nasopharyngeal swab, presence of viral mutation(s) within the areas targeted by this assay, and inadequate number of viral copies(<138 copies/mL). A negative result must be combined with clinical observations, patient history, and epidemiological information. The expected result is Negative.  Fact Sheet for Patients:  BloggerCourse.com  Fact Sheet for Healthcare Providers:  SeriousBroker.it  This test is no t yet approved or cleared by the [place]  FDA and  has been authorized for detection and/or diagnosis of SARS-CoV-2 by FDA under an Emergency Use Authorization (EUA). This EUA will remain  in effect (meaning this test can be used) for the duration of the COVID-19 declaration under Section 564(b)(1) of the Act, 21 U.S.C.section 360bbb-3(b)(1), unless the authorization is terminated  or revoked sooner.       Influenza A by PCR NEGATIVE NEGATIVE Final   Influenza B by PCR NEGATIVE NEGATIVE Final    Comment: (NOTE) The Xpert Xpress SARS-CoV-2/FLU/RSV plus assay is intended as an aid in the diagnosis of influenza from Nasopharyngeal swab specimens and should not be used as a sole basis for treatment. Nasal washings and aspirates are unacceptable for Xpert Xpress SARS-CoV-2/FLU/RSV testing.  Fact Sheet for Patients: BloggerCourse.com  Fact Sheet for Healthcare Providers: SeriousBroker.it  This test is not yet approved or cleared by the [place]  FDA and has been authorized for detection and/or diagnosis of SARS-CoV-2 by FDA under an Emergency Use Authorization (EUA). This EUA will remain in effect (meaning this test can be used) for the duration of the COVID-19 declaration under Section 564(b)(1) of the Act,  21 U.S.C. section 360bbb-3(b)(1), unless the authorization is terminated or revoked.     Resp Syncytial Virus by PCR NEGATIVE NEGATIVE Final    Comment: (NOTE) Fact Sheet for Patients: BloggerCourse.com  Fact Sheet for Healthcare Providers: SeriousBroker.it  This test is not yet approved or cleared by the [place]  FDA and has been authorized for detection and/or diagnosis of SARS-CoV-2 by FDA under an Emergency Use Authorization (EUA). This EUA will remain in effect (meaning this test can be used) for the duration of the COVID-19 declaration under Section 564(b)(1) of the Act, 21 U.S.C. section 360bbb-3(b)(1), unless the authorization is terminated or revoked.  Performed at [facility], [address] [place], [place] [address]   MRSA Next Gen by PCR, Nasal     Status: Abnormal   Collection Time: 11/24/23  4:29 AM   Specimen: Nasal Mucosa; Nasal Swab  Result Value Ref Range Status   MRSA by PCR Next Gen DETECTED (A) NOT DETECTED Final    Comment: RESULT CALLED TO, READ BACK BY AND VERIFIED WITH: CONTRERAS, F. 0907 11/24/23 BY JE (NOTE) The GeneXpert MRSA Assay (FDA approved for NASAL specimens only), is one component of a comprehensive MRSA colonization surveillance program. It is not intended to diagnose MRSA infection nor to guide or monitor treatment for MRSA infections. Test performance is not FDA approved in patients less than 47 years old. Performed at [facility], [address], [place], [place] [address]   Respiratory (~20 pathogens) panel by PCR     Status: None   Collection Time: 11/24/23 12:16 PM   Specimen: Nasopharyngeal Swab; Respiratory  Result Value Ref Range Status   Adenovirus NOT DETECTED NOT DETECTED Final   Coronavirus 229E NOT DETECTED NOT DETECTED Final    Comment: (NOTE) The Coronavirus on the Respiratory Panel, DOES NOT test for the novel  Coronavirus  (2019 nCoV)    Coronavirus HKU1 NOT DETECTED NOT DETECTED Final   Coronavirus NL63 NOT DETECTED NOT DETECTED Final   Coronavirus OC43 NOT DETECTED NOT DETECTED Final   Metapneumovirus NOT DETECTED NOT DETECTED Final   Rhinovirus / Enterovirus NOT DETECTED NOT DETECTED Final   Influenza A NOT DETECTED NOT DETECTED Final   Influenza B NOT DETECTED NOT DETECTED Final   Parainfluenza Virus 1 NOT DETECTED NOT DETECTED Final   Parainfluenza Virus 2 NOT DETECTED NOT DETECTED Final   Parainfluenza Virus 3 NOT DETECTED NOT DETECTED Final   Parainfluenza Virus 4 NOT DETECTED NOT DETECTED Final   Respiratory Syncytial Virus NOT DETECTED NOT DETECTED Final   Bordetella pertussis NOT DETECTED NOT DETECTED Final   Bordetella Parapertussis NOT DETECTED NOT DETECTED Final   Chlamydophila pneumoniae NOT DETECTED NOT DETECTED Final   Mycoplasma pneumoniae NOT DETECTED NOT DETECTED Final    Comment: Performed at [facility], [address] [place] [address] [place], [place] [address]  MRSA Next Gen by PCR, Nasal     Status: Abnormal   Collection Time: 11/30/23  6:53 AM   Specimen: Nasal Mucosa; Nasal Swab  Result Value Ref Range Status   MRSA by PCR Next Gen DETECTED (A) NOT DETECTED Final    Comment: RESULT CALLED TO, READ BACK BY AND VERIFIED WITH:  [place], D 11/30/23 1126 AJ (NOTE) The GeneXpert MRSA Assay (FDA approved for NASAL specimens only), is one component of a comprehensive MRSA colonization surveillance program. It is not intended to diagnose MRSA infection nor to guide or monitor treatment for MRSA infections. Test performance is not FDA approved in patients less than 13 years old. Performed at [facility], [address], [place], [place] [address]   Resp panel by RT-PCR (RSV, Flu A&B, Covid) Anterior Nasal Swab     Status: None   Collection Time: 11/30/23  9:00 AM   Specimen: Anterior Nasal Swab  Result Value Ref Range Status   SARS Coronavirus 2 by RT PCR NEGATIVE  NEGATIVE Final    Comment: (NOTE) SARS-CoV-2 target nucleic acids are NOT DETECTED.  The SARS-CoV-2 RNA is generally detectable in upper respiratory specimens during the acute phase of infection. The lowest concentration of SARS-CoV-2 viral copies this assay can detect is 138 copies/mL.  A negative result does not preclude SARS-Cov-2 infection and should not be used as the sole basis for treatment or other patient management decisions. A negative result may occur with  improper specimen collection/handling, submission of specimen other than nasopharyngeal swab, presence of viral mutation(s) within the areas targeted by this assay, and inadequate number of viral copies(<138 copies/mL). A negative result must be combined with clinical observations, patient history, and epidemiological information. The expected result is Negative.  Fact Sheet for Patients:  BloggerCourse.com  Fact Sheet for Healthcare Providers:  SeriousBroker.it  This test is no t yet approved or cleared by the [place]  FDA and  has been authorized for detection and/or diagnosis of SARS-CoV-2 by FDA under an Emergency Use Authorization (EUA). This EUA will remain  in effect (meaning this test can be used) for the duration of the COVID-19 declaration under Section 564(b)(1) of the Act, 21 U.S.C.section 360bbb-3(b)(1), unless the authorization is terminated  or revoked sooner.       Influenza A by PCR NEGATIVE NEGATIVE Final   Influenza B by PCR NEGATIVE NEGATIVE Final    Comment: (NOTE) The Xpert Xpress SARS-CoV-2/FLU/RSV plus assay is intended as an aid in the diagnosis of influenza from Nasopharyngeal swab specimens and should not be used as a sole basis for treatment. Nasal washings and aspirates are unacceptable for Xpert Xpress SARS-CoV-2/FLU/RSV testing.  Fact Sheet for Patients: BloggerCourse.com  Fact Sheet for Healthcare  Providers: SeriousBroker.it  This test is not yet approved or cleared by the [place]  FDA and has been authorized for detection and/or diagnosis of SARS-CoV-2 by FDA under an Emergency Use Authorization (EUA). This EUA will remain in effect (meaning this test can be used) for the duration of the COVID-19 declaration under Section 564(b)(1) of the Act, 21 U.S.C. section 360bbb-3(b)(1), unless the authorization is terminated or revoked.     Resp Syncytial Virus by PCR NEGATIVE NEGATIVE Final    Comment: (NOTE) Fact Sheet for Patients: BloggerCourse.com  Fact Sheet for Healthcare Providers: SeriousBroker.it  This test is not yet approved or cleared by the [place]  FDA and has been authorized for detection and/or diagnosis of SARS-CoV-2 by FDA under an Emergency Use Authorization (EUA). This EUA will remain in effect (meaning this test can be used) for the duration of the COVID-19 declaration under Section 564(b)(1) of the Act, 21 U.S.C. section 360bbb-3(b)(1), unless the authorization is terminated or revoked.  Performed at [facility], [address], [place], [place] [address]   Culture, blood (routine x 2) Call MD if unable to obtain prior to antibiotics being given     Status: None (Preliminary result)   Collection Time: 11/30/23  5:37 PM   Specimen: BLOOD RIGHT HAND  Result Value Ref Range Status   Specimen Description   Final    BLOOD RIGHT HAND Performed at [facility], [address], [place], [place] [address]    Special Requests   Final    BOTTLES DRAWN AEROBIC AND ANAEROBIC Blood Culture adequate volume Performed at [facility], [address], [place], [place] [address]    Culture   Final    NO GROWTH 2 DAYS Performed at [facility], [address] [place], [place] [address]    Report Status PENDING  Incomplete  Culture,  blood (routine x 2) Call MD if unable to obtain prior to antibiotics being given     Status: None (Preliminary result)   Collection Time: 11/30/23  5:41  PM   Specimen: BLOOD LEFT HAND  Result Value Ref Range Status   Specimen Description   Final    BLOOD LEFT HAND Performed at [facility], [address], [place], [place] [address]    Special Requests   Final    BOTTLES DRAWN AEROBIC AND ANAEROBIC Blood Culture adequate volume Performed at [facility], [address], [place], [place] [address]    Culture   Final    NO GROWTH 2 DAYS Performed at [facility], [address] [place], [place] [address]    Report Status PENDING  Incomplete         Radiology Studies: No results found.       Scheduled Meds:  arformoterol   15 mcg Nebulization BID   And   umeclidinium bromide   1 puff Inhalation Daily   aspirin  EC  81 mg Oral Daily   buPROPion   300 mg Oral q morning   busPIRone   15 mg Oral q AM   cyclobenzaprine   20 mg Oral QHS   cycloSPORINE   1 drop Both Eyes Q12H   diltiazem   120 mg Oral Daily   DULoxetine   60 mg Oral BID   enoxaparin  (LOVENOX ) injection  60 mg Subcutaneous Q24H   fluticasone   2 spray Each Nare Daily   gabapentin   100 mg Oral TID   insulin  aspart  0-15 Units Subcutaneous TID WC   insulin  aspart  0-5 Units Subcutaneous QHS   Oxcarbazepine   900 mg Oral BID   pantoprazole   40 mg Oral Daily   predniSONE   40 mg Oral Q breakfast   tamsulosin   0.8 mg Oral q AM   traZODone   75 mg Oral QHS   Continuous Infusions:  ceFEPime  (MAXIPIME ) IV 2 g (12/03/23 0614)   vancomycin  Stopped (12/02/23 1841)     LOS: 3 days   Time spent:  Elsie JAYSON Montclair, DO [facility]  If 7PM-7AM, please contact night-coverage [email or web address]  12/03/2023, 7:33 AM

## 2023-12-03 NOTE — Plan of Care (Signed)
  Problem: Coping: Goal: Ability to adjust to condition or change in health will improve Outcome: Progressing   Problem: Fluid Volume: Goal: Ability to maintain a balanced intake and output will improve Outcome: Progressing   Problem: Health Behavior/Discharge Planning: Goal: Ability to identify and utilize available resources and services will improve Outcome: Progressing Goal: Ability to manage health-related needs will improve Outcome: Progressing   Problem: Metabolic: Goal: Ability to maintain appropriate glucose levels will improve Outcome: Progressing   Problem: Nutritional: Goal: Maintenance of adequate nutrition will improve Outcome: Progressing   Problem: Education: Goal: Knowledge of General Education information will improve Description: Including pain rating scale, medication(s)/side effects and non-pharmacologic comfort measures Outcome: Progressing   Problem: Health Behavior/Discharge Planning: Goal: Ability to manage health-related needs will improve Outcome: Progressing   Problem: Clinical Measurements: Goal: Ability to maintain clinical measurements within normal limits will improve Outcome: Progressing Goal: Will remain free from infection Outcome: Progressing Goal: Diagnostic test results will improve Outcome: Progressing Goal: Respiratory complications will improve Outcome: Progressing   Problem: Clinical Measurements: Goal: Will remain free from infection Outcome: Progressing

## 2023-12-03 NOTE — Progress Notes (Signed)
 SATURATION QUALIFICATIONS: (This note is used to comply with regulatory documentation for home oxygen )  Patient Saturations on 8L at Rest = 91%  Patient Saturations on 8L while Ambulating = 85%  Patient Saturations on 15 Liters of oxygen  while Ambulating = 93%  Please briefly explain why patient needs home oxygen :

## 2023-12-03 NOTE — Plan of Care (Signed)

## 2023-12-03 NOTE — TOC Initial Note (Signed)
 Transition of Care [facility]) - Initial/Assessment Note    Patient Details  Name: Mario [name] MRN: [id number] Date of Birth: 03-Aug-1950  Transition of Care [facility]) CM/SW Contact:    Sonda Manuella Quill, RN Phone Number: 12/03/2023, 6:08 PM  Clinical Narrative:                 [place] w/ pt in room; pt said he lives at home w/ his roommate; he plans to return at d/c; pt uses [facility] insurance/[facility]P; his roommate w/ provide transportation; he denied SDOH risks; pt has cane, walker, Rollator, shower chair; HHPT/OT/RN w/ [facility]; and home oxygen  w/ [facility]; he has travel tank; pt expresses concern b/c he said  [facility] does not have concentrator that provides oxygen  > 8L/min; pt recently d/c'd 11/28/23; IP CM is following.  Expected Discharge Plan: Home w Home Health Services Barriers to Discharge: Continued Medical Work up   Patient Goals and CMS Choice Patient states their goals for this hospitalization and ongoing recovery are:: home          Expected Discharge Plan and Services       Living arrangements for the past 2 months: Single Family Home                                      Prior Living Arrangements/Services Living arrangements for the past 2 months: Single Family Home Lives with:: Roommate Patient language and need for interpreter reviewed:: Yes Do you feel safe going back to the place where you live?: Yes      Need for Family Participation in Patient Care: Yes (Comment) Care giver support system in place?: Yes (comment) Current home services: DME, Home OT, Home PT, Home RN (cane, walker, Rollator, shower chair; HHPT/OT/RN w/ [facility]; home oxygen  w/ [facility]) Criminal Activity/Legal Involvement Pertinent to Current Situation/Hospitalization: No - Comment as needed  Activities of Daily Living   ADL Screening (condition at time of admission) Independently performs ADLs?: Yes (appropriate for developmental age) Is the patient deaf or have  difficulty hearing?: No Does the patient have difficulty seeing, even when wearing glasses/contacts?: No Does the patient have difficulty concentrating, remembering, or making decisions?: No  Permission Sought/Granted Permission sought to share information with : Case Manager Permission granted to share information with : Yes, Verbal Permission Granted  Share Information with NAME: Case Manager     Permission granted to share info w Relationship: Bobbi Criolli (dtr) [phone]     Emotional Assessment Appearance:: Appears stated age Attitude/Demeanor/Rapport: Gracious Affect (typically observed): Accepting Orientation: : Oriented to Self, Oriented to Place, Oriented to  Time, Oriented to Situation Alcohol  / Substance Use: Not Applicable Psych Involvement: No (comment)  Admission diagnosis:  COPD exacerbation (HCC) [J44.1] Multifocal pneumonia [J18.8] Patient Active Problem List   Diagnosis Date Noted   Metastatic non-small cell lung cancer (HCC) 12/02/2023   Multifocal pneumonia 11/30/2023   COPD exacerbation (HCC) 11/24/2023   DNR (do not resuscitate)/DNI(Do not intubate) 05/24/2023   Acute on chronic respiratory failure with hypoxia (HCC) - chronically on 5 L/min home O2. 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 ENT consulted. nasal pack placed. 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 09/21/2020   Constipation    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   Stage 3a chronic kidney disease (HCC) 12/26/2019   COPD (chronic obstructive pulmonary disease) (HCC) - Pt frequently wheezes after physical exertion. This does NOT mean he has a COPD exacerbation.    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   DM2 (diabetes mellitus, type 2) (HCC) 07/16/2017   HLA B27 (HLA B27 positive) 07/16/2017   Malingering 01/21/2013   Tobacco abuse 01/11/2013   Obesity, unspecified 01/11/2013   Hypertension associated with diabetes (HCC) 01/10/2013   PCP:  [name] Pharmacy:   [facility] - [place], [place] - [address] [place] [address] Phone: [phone] Fax: [phone]  DARRYLE LONG - [facility] [address] [place] [address] Phone: [phone] Fax: [phone]     Social Drivers of Health (SDOH) Social History: SDOH Screenings   Food Insecurity: No Food Insecurity (12/03/2023)  Housing: Low Risk  (12/03/2023)  Transportation Needs: No Transportation Needs (12/03/2023)  Recent Concern: Transportation Needs - Unmet Transportation Needs (11/30/2023)  Utilities: Not At Risk (12/03/2023)  Financial Resource Strain: Low Risk  (09/23/2020)  Social Connections: Socially Isolated (11/30/2023)  Stress: No Stress Concern Present (09/23/2020)  Tobacco Use: Medium Risk (11/30/2023)   SDOH Interventions: Food Insecurity Interventions: Inpatient TOC,  Intervention Not Indicated Housing Interventions: Intervention Not Indicated, Inpatient TOC Transportation Interventions: Intervention Not Indicated, Inpatient TOC Utilities Interventions: Intervention Not Indicated, Inpatient TOC   Readmission Risk Interventions    12/03/2023    6:04 PM 11/27/2023    8:36 AM 05/06/2023    3:25 PM  Readmission Risk Prevention Plan  Transportation Screening Complete Complete Complete  Medication Review Oceanographer) Complete Complete Complete  PCP or Specialist appointment within 3-5 days of discharge Complete  Complete  HRI or Home Care Consult Complete Complete Complete  SW Recovery Care/Counseling Consult Complete Complete Complete  Palliative Care Screening Not Applicable Not Applicable Not Applicable  Skilled Nursing Facility Not Applicable Not Applicable Not Applicable

## 2023-12-04 DIAGNOSIS — C3490 Malignant neoplasm of unspecified part of unspecified bronchus or lung: Secondary | ICD-10-CM | POA: Diagnosis not present

## 2023-12-04 DIAGNOSIS — J441 Chronic obstructive pulmonary disease with (acute) exacerbation: Secondary | ICD-10-CM

## 2023-12-04 DIAGNOSIS — J188 Other pneumonia, unspecified organism: Secondary | ICD-10-CM | POA: Diagnosis not present

## 2023-12-04 LAB — GLUCOSE, CAPILLARY
Glucose-Capillary: 103 mg/dL — ABNORMAL HIGH (ref 70–99)
Glucose-Capillary: 136 mg/dL — ABNORMAL HIGH (ref 70–99)
Glucose-Capillary: 165 mg/dL — ABNORMAL HIGH (ref 70–99)
Glucose-Capillary: 149 mg/dL — ABNORMAL HIGH (ref 70–99)

## 2023-12-04 LAB — BASIC METABOLIC PANEL WITH GFR
Anion gap: 9 (ref 5–15)
BUN: 16 mg/dL (ref 8–23)
CO2: 24 mmol/L (ref 22–32)
Calcium: 8.9 mg/dL (ref 8.9–10.3)
Chloride: 101 mmol/L (ref 98–111)
Creatinine, Ser: 0.85 mg/dL (ref 0.61–1.24)
GFR, Estimated: >60 mL/min (ref >60)
Glucose, Bld: 113 mg/dL — ABNORMAL HIGH (ref 70–99)
Potassium: 3.7 mmol/L (ref 3.5–5.1)
Sodium: 134 mmol/L — ABNORMAL LOW (ref 135–145)

## 2023-12-04 LAB — CBC
HCT: 43.6 % (ref 39.0–52.0)
Hemoglobin: 13.9 g/dL (ref 13.0–17.0)
MCH: 29 pg (ref 26.0–34.0)
MCHC: 31.9 g/dL (ref 30.0–36.0)
MCV: 90.8 fL (ref 80.0–100.0)
Platelets: 322 K/uL (ref 150–400)
RBC: 4.8 MIL/uL (ref 4.22–5.81)
RDW: 15 % (ref 11.5–15.5)
WBC: 13.4 K/uL — ABNORMAL HIGH (ref 4.0–10.5)
nRBC: 0 % (ref 0.0–0.2)

## 2023-12-04 NOTE — Progress Notes (Signed)
   12/03/23 2300  BiPAP/CPAP/SIPAP  BiPAP/CPAP/SIPAP Pt Type Adult (CPAP setup and patient will place when ready)  BiPAP/CPAP/SIPAP Resmed  Mask Type Full face mask  Dentures removed? Not applicable  Mask Size Large  EPAP 9 cmH2O  Flow Rate 8 lpm  Patient Home Machine No  Patient Home Mask No  Patient Home Tubing No  Auto Titrate No  Device Plugged into RED Power Outlet Yes  BiPAP/CPAP /SiPAP Vitals  Pulse Rate 97  Resp 16  SpO2 93 %  Bilateral Breath Sounds Clear;Diminished  MEWS Score/Color  MEWS Score 0  MEWS Score Color Landy

## 2023-12-04 NOTE — Progress Notes (Signed)
 PROGRESS NOTE    Mario [name]  FMW:[id number] DOB: 1950-03-17 DOA: 11/30/2023 PCP: [name]   Brief Narrative:  Mario [name] is a 73 y.o. male with medical history significant of ankylosing spondylitis, chronic hypoxemic respiratory failure in the setting of COPD on 8 L of oxygen  via nasal cannulae at home, depression with prior history of suicide attempt, left lung cancer s/p radiation therapy, hypertension, OSA, obesity, type 2 diabetes, CKD stage III A presented with worsening shortness of breath.  Patient had bronchoscopy with biopsy this past spring concerning for adenocarcinoma.  He was scheduled to follow-up locally with Dr. [name] but [facility] he did not show up to his appointment there and appears to have been lost to follow-up.  Patient was just seen in our facility and discharged on 9/30 for presumed COPD exacerbation, he appears to have been noncompliant with medications at home and reports back to our facility on 10/2 for worsening hypoxia from baseline.  Assessment & Plan:   Active Problems:   Goals of care, counseling/discussion   Multifocal pneumonia   Metastatic non-small cell lung cancer (HCC)  Acute on chronic hypoxemic respiratory failure in the setting of multifocal pneumonia Rule out worsening lung cancer Hemoptysis, transient -Patient recently admitted and discharged on 9/30 for COPD exacerbation  -Continue steroid taper -Continues to require oxygen  above prior baseline (8L) - now on 10-15L to keep sats >88 -Markedly abnormal CTA concerning for multifocal pneumonia, upon further evaluation and discussion with pulmonology his left lung changes appear to be secondary to prior radiation from lung cancer and right sided infiltrate is concerning for possible new malignancy given duration as it was present on imaging earlier this year as well as previously last year. - Patient was to be evaluated  locally by oncology for possible systemic treatment but declined and requested transfer of care and follow up at the [facility], it is unclear whether or not he underwent any treatment or evaluation at their facility. **No show note on May 13 at the [facility]- no notes after this date** - Given abnormal CTA here concerning for possible infiltrate will treat patient for multifocal pneumonia/HCAP given recent admission with cefepime  vancomycin ; Patient does not appear septic, does not meet sepsis criteria, remains afebrile -mild leukocytosis in the setting of recent steroid use - low threshold to discontinue antibiotics - Baseline oxygen  at this point is 8 L nasal cannula at home, he is minimally active due to profound dyspnea -Viral panel negative, cultures remain pending -Previous history of hemoptysis, continue to follow clinically - hold anticoagulation - Patient to follow up outpatient with Dr [name] after discharge.   Presumed recurrent lung cancer, POA - See above, oncology consulted for further insight and recommendations (patient previously refusing systemic treatment) - Unclear at this time whether or not patient has a clear avenue for treatment moving forward, will likely need to involve palliative care in the interim  Elevated troponin: - Likely type II, in the setting of hypoxia.   Prior history of left lung cancer - Status post radiation in the past.  Followed by pulmonology at [facility]   Ankylosing spondylitis - On Flexeril , gabapentin  and Norco-Will continue same   Depression with prior history of suicide attempt: - Continue bupropion , buspirone , duloxetine , oxcarbazepine  and trazodone    Hypertension: On diltiazem .  Will continue same   BPH: Continue Flomax    Type 2 diabetes: A1c 6.7.  Uncontrolled with hyperglycemia - On metformin  at  home.  Hold for now.  Start sliding scale insulin    Stage III CKD: - Stable at baseline Morbid obesity with BMI of 36 - Associated with hypertension,  hyperlipidemia   Thrombocytosis: Likely reactive.  Will continue to monitor  DVT prophylaxis: SCDs Start: 11/30/23 0901 hold chemical prophylaxis in the setting of hemoptysis Code Status:   Code Status: Limited: Do not attempt resuscitation (DNR) -DNR-LIMITED -Do Not Intubate/DNI  Family Communication: None present  Status is: Inpatient  Dispo: The patient is from: Home              Anticipated d/c is to: To be determined              Anticipated d/c date is: To be determined              Patient currently not medically stable for discharge  Consultants:  Oncology  Procedures:  None  Antimicrobials:  Cefepime , vancomycin   Subjective: No acute issues or events overnight denies chest pain fevers chills nausea vomiting diarrhea constipation.  Shortness of breath ongoing with no real improvement, worsening cough with noted scant hemoptysis overnight as well.  Objective: Vitals:   12/03/23 1303 12/03/23 1916 12/03/23 2300 12/04/23 0453  BP: 119/71 134/69  (!) 155/91  Pulse: (!) 109 99 97 94  Resp: 16 18 16 18   Temp: 98.2 F (36.8 C) 98.3 F (36.8 C)  (!) 97.5 F (36.4 C)  TempSrc: Oral Oral  Oral  SpO2: 93% 91% 93% 92%  Weight:      Height:        Intake/Output Summary (Last 24 hours) at 12/04/2023 0738 Last data filed at 12/03/2023 2336 Gross per 24 hour  Intake --  Output 2100 ml  Net -2100 ml   Filed Weights   11/30/23 1900  Weight: 127.8 kg    Examination:  General:  Pleasantly resting in bed, No acute distress. HEENT:  Normocephalic atraumatic.  Sclerae nonicteric, noninjected.  Neck:  Without mass or deformity.  Trachea is midline. Lungs: Diminished bilaterally with diffuse rhonchi, no wheeze or rales noted Heart:  Regular rate and rhythm.  Without murmurs, rubs, or gallops. Abdomen:  Soft, he is, nontender, nondistended.  Without guarding or rebound. Extremities: Without cyanosis, clubbing, edema, or obvious deformity. Skin:  Warm and dry, no  erythema.  Data Reviewed: I have personally reviewed following labs and imaging studies  CBC: Recent Labs  Lab 11/28/23 0558 11/30/23 0300 11/30/23 0901 12/01/23 0525 12/02/23 0859 12/03/23 0533 12/04/23 0522  WBC 15.5* 16.5* 13.3* 17.5* 12.5* 12.4* 13.4*  NEUTROABS 10.4* 12.1*  --   --   --   --   --   HGB 14.2 15.6 14.0 13.7 13.4 12.7* 13.9  HCT 45.5 49.2 44.0 44.7 42.6 41.3 43.6  MCV 91.9 91.8 91.1 93.1 93.4 92.0 90.8  PLT 402* 431* 384 351 336 320 322   Basic Metabolic Panel: Recent Labs  Lab 11/30/23 0300 11/30/23 0901 12/01/23 0525 12/02/23 0621 12/03/23 0533 12/04/23 0522  NA 139  --  139 135 136 134*  K 4.6  --  4.2 4.3 3.9 3.7  CL 98  --  102 100 100 101  CO2 27  --  26 25 26 24   GLUCOSE 146*  --  114* 90 118* 113*  BUN 24*  --  21 17 15 16   CREATININE 1.20 0.96 0.98 0.84 0.85 0.85  CALCIUM  9.9  --  9.1 8.9 8.8* 8.9   GFR: Estimated Creatinine Clearance: 111.6 mL/min (  by C-G formula based on SCr of 0.85 mg/dL). Liver Function Tests: Recent Labs  Lab 11/30/23 0300  AST 22  ALT 29  ALKPHOS 111  BILITOT 0.3  PROT 7.7  ALBUMIN 4.4   BNP (last 3 results) Recent Labs    11/23/23 2315 11/30/23 0300  PROBNP 85.2 127.0   HbA1C: No results for input(s): HGBA1C in the last 72 hours.  CBG: Recent Labs  Lab 12/03/23 0804 12/03/23 1311 12/03/23 1620 12/03/23 2037 12/04/23 0713  GLUCAP 106* 192* 155* 161* 103*    Recent Results (from the past 240 hours)  Respiratory (~20 pathogens) panel by PCR     Status: None   Collection Time: 11/24/23 12:16 PM   Specimen: Nasopharyngeal Swab; Respiratory  Result Value Ref Range Status   Adenovirus NOT DETECTED NOT DETECTED Final   Coronavirus 229E NOT DETECTED NOT DETECTED Final    Comment: (NOTE) The Coronavirus on the Respiratory Panel, DOES NOT test for the novel  Coronavirus (2019 nCoV)    Coronavirus HKU1 NOT DETECTED NOT DETECTED Final   Coronavirus NL63 NOT DETECTED NOT DETECTED Final    Coronavirus OC43 NOT DETECTED NOT DETECTED Final   Metapneumovirus NOT DETECTED NOT DETECTED Final   Rhinovirus / Enterovirus NOT DETECTED NOT DETECTED Final   Influenza A NOT DETECTED NOT DETECTED Final   Influenza B NOT DETECTED NOT DETECTED Final   Parainfluenza Virus 1 NOT DETECTED NOT DETECTED Final   Parainfluenza Virus 2 NOT DETECTED NOT DETECTED Final   Parainfluenza Virus 3 NOT DETECTED NOT DETECTED Final   Parainfluenza Virus 4 NOT DETECTED NOT DETECTED Final   Respiratory Syncytial Virus NOT DETECTED NOT DETECTED Final   Bordetella pertussis NOT DETECTED NOT DETECTED Final   Bordetella Parapertussis NOT DETECTED NOT DETECTED Final   Chlamydophila pneumoniae NOT DETECTED NOT DETECTED Final   Mycoplasma pneumoniae NOT DETECTED NOT DETECTED Final    Comment: Performed at [facility], [address], [place], [place] [address]  MRSA Next Gen by PCR, Nasal     Status: Abnormal   Collection Time: 11/30/23  6:53 AM   Specimen: Nasal Mucosa; Nasal Swab  Result Value Ref Range Status   MRSA by PCR Next Gen DETECTED (A) NOT DETECTED Final    Comment: RESULT CALLED TO, READ BACK BY AND VERIFIED WITH:  [place], D 11/30/23 1126 AJ (NOTE) The GeneXpert MRSA Assay (FDA approved for NASAL specimens only), is one component of a comprehensive MRSA colonization surveillance program. It is not intended to diagnose MRSA infection nor to guide or monitor treatment for MRSA infections. Test performance is not FDA approved in patients less than 37 years old. Performed at [facility], [address], [place], [place] [address]   Resp panel by RT-PCR (RSV, Flu A&B, Covid) Anterior Nasal Swab     Status: None   Collection Time: 11/30/23  9:00 AM   Specimen: Anterior Nasal Swab  Result Value Ref Range Status   SARS Coronavirus 2 by RT PCR NEGATIVE NEGATIVE Final    Comment: (NOTE) SARS-CoV-2 target nucleic acids are NOT DETECTED.  The SARS-CoV-2 RNA is generally  detectable in upper respiratory specimens during the acute phase of infection. The lowest concentration of SARS-CoV-2 viral copies this assay can detect is 138 copies/mL. A negative result does not preclude SARS-Cov-2 infection and should not be used as the sole basis for treatment or other patient management decisions. A negative result may occur with  improper specimen collection/handling, submission of specimen other  than nasopharyngeal swab, presence of viral mutation(s) within the areas targeted by this assay, and inadequate number of viral copies(<138 copies/mL). A negative result must be combined with clinical observations, patient history, and epidemiological information. The expected result is Negative.  Fact Sheet for Patients:  BloggerCourse.com  Fact Sheet for Healthcare Providers:  SeriousBroker.it  This test is no t yet approved or cleared by the [place]  FDA and  has been authorized for detection and/or diagnosis of SARS-CoV-2 by FDA under an Emergency Use Authorization (EUA). This EUA will remain  in effect (meaning this test can be used) for the duration of the COVID-19 declaration under Section 564(b)(1) of the Act, 21 U.S.C.section 360bbb-3(b)(1), unless the authorization is terminated  or revoked sooner.       Influenza A by PCR NEGATIVE NEGATIVE Final   Influenza B by PCR NEGATIVE NEGATIVE Final    Comment: (NOTE) The Xpert Xpress SARS-CoV-2/FLU/RSV plus assay is intended as an aid in the diagnosis of influenza from Nasopharyngeal swab specimens and should not be used as a sole basis for treatment. Nasal washings and aspirates are unacceptable for Xpert Xpress SARS-CoV-2/FLU/RSV testing.  Fact Sheet for Patients: BloggerCourse.com  Fact Sheet for Healthcare Providers: SeriousBroker.it  This test is not yet approved or cleared by the [place]  FDA  and has been authorized for detection and/or diagnosis of SARS-CoV-2 by FDA under an Emergency Use Authorization (EUA). This EUA will remain in effect (meaning this test can be used) for the duration of the COVID-19 declaration under Section 564(b)(1) of the Act, 21 U.S.C. section 360bbb-3(b)(1), unless the authorization is terminated or revoked.     Resp Syncytial Virus by PCR NEGATIVE NEGATIVE Final    Comment: (NOTE) Fact Sheet for Patients: BloggerCourse.com  Fact Sheet for Healthcare Providers: SeriousBroker.it  This test is not yet approved or cleared by the [place]  FDA and has been authorized for detection and/or diagnosis of SARS-CoV-2 by FDA under an Emergency Use Authorization (EUA). This EUA will remain in effect (meaning this test can be used) for the duration of the COVID-19 declaration under Section 564(b)(1) of the Act, 21 U.S.C. section 360bbb-3(b)(1), unless the authorization is terminated or revoked.  Performed at [facility], [address] [place], [place] [address]   Culture, blood (routine x 2) Call MD if unable to obtain prior to antibiotics being given     Status: None (Preliminary result)   Collection Time: 11/30/23  5:37 PM   Specimen: BLOOD RIGHT HAND  Result Value Ref Range Status   Specimen Description   Final    BLOOD RIGHT HAND Performed at [facility], [address] [place], [place] [address]    Special Requests   Final    BOTTLES DRAWN AEROBIC AND ANAEROBIC Blood Culture adequate volume Performed at [facility], [address], [place], [place] [address]    Culture   Final    NO GROWTH 3 DAYS Performed at [facility], [address] [place], [place] [address]    Report Status PENDING  Incomplete  Culture, blood (routine x 2) Call MD if unable to obtain prior to antibiotics being given     Status: None (Preliminary result)    Collection Time: 11/30/23  5:41 PM   Specimen: BLOOD LEFT HAND  Result Value Ref Range Status   Specimen Description   Final    BLOOD LEFT HAND Performed at [facility], [address], [place], [place]  [address]    Special Requests   Final    BOTTLES DRAWN AEROBIC AND ANAEROBIC Blood Culture adequate volume Performed at [facility], [address] [place], [place] [address]    Culture   Final    NO GROWTH 3 DAYS Performed at [facility], [address], [place], [place] [address]    Report Status PENDING  Incomplete         Radiology Studies: No results found.       Scheduled Meds:  arformoterol   15 mcg Nebulization BID   And   umeclidinium bromide   1 puff Inhalation Daily   aspirin  EC  81 mg Oral Daily   buPROPion   300 mg Oral q morning   busPIRone   15 mg Oral q AM   cyclobenzaprine   20 mg Oral QHS   cycloSPORINE   1 drop Both Eyes Q12H   diltiazem   120 mg Oral Daily   DULoxetine   60 mg Oral BID   enoxaparin  (LOVENOX ) injection  60 mg Subcutaneous Q24H   fluticasone   2 spray Each Nare Daily   gabapentin   100 mg Oral TID   insulin  aspart  0-15 Units Subcutaneous TID WC   insulin  aspart  0-5 Units Subcutaneous QHS   Oxcarbazepine   900 mg Oral BID   pantoprazole   40 mg Oral Daily   predniSONE   40 mg Oral Q breakfast   tamsulosin   0.8 mg Oral q AM   traZODone   75 mg Oral QHS   Continuous Infusions:  ceFEPime  (MAXIPIME ) IV 2 g (12/04/23 0502)   vancomycin  1,250 mg (12/04/23 0541)     LOS: 4 days   Time spent:  Elsie JAYSON [place], DO [facility]  If 7PM-7AM, please contact night-coverage [email or web address]  12/04/2023, 7:38 AM

## 2023-12-04 NOTE — Progress Notes (Signed)
 Mario [name]   DOB:Feb 26, 1951   FM#:[id number]      ASSESSMENT & PLAN:  [name] is a 73 year old male patient with oncologic history significant for metastatic lung cancer.  Patient was admitted on 11/30/2023 with worsening shortness of breath.  Shortness of breath History of COPD Concern for multifocal pneumonia - Improving per patient. - Currently on O2 8 L via nasal cannula.  Patient he states that he is on the same at home. 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 Gatha.  Reports that he wants to proceed with treatment although he has missed treatments in the past.  States he did not have transportation and thought the cancer center was picking him up to bring him to his appointment.  States he now has a car and that he is willing to come to the outpatient appointment to discuss treatment options. - Outpatient oncology appointment will be made upon discharge to discuss best supportive care through hospice or systemic palliative therapy options. - Medical oncology/Dr. [name] will follow closely   Code Status DNR-Limited  Subjective:  Patient seen awake and alert sitting up in bed with O2 8 L via [place] ongoing.  Patient reports that he would like to [facility] for continuation of cancer treatments.  Patient states that he did not come to previous outpatient appointments because he did not have transportation.  States he now has transportation.  No other complaints offered.  Objective:   Intake/Output Summary (Last 24 hours) at 12/04/2023 1050 Last data filed at 12/03/2023 2336 Gross per 24 hour  Intake --  Output 1600 ml  Net -1600 ml     PHYSICAL EXAMINATION: ECOG PERFORMANCE STATUS: 3 - Symptomatic, >50% confined to bed  Vitals:   12/03/23 2300 12/04/23 0453  BP:  (!) 155/91  Pulse: 97 94  Resp: 16 18   Temp:  (!) 97.5 F (36.4 C)  SpO2: 93% 92%   Filed Weights   11/30/23 1900  Weight: 281 lb 12 oz (127.8 kg)    GENERAL: alert, no distress and comfortable +chronically ill-appearing SKIN: + Pale skin color, texture, turgor are normal, no rashes or significant lesions EYES: normal, conjunctiva are pink and non-injected, sclera clear OROPHARYNX: no exudate, no erythema and lips, buccal mucosa, and tongue normal  NECK: supple, thyroid  normal size, non-tender, without nodularity LYMPH: no palpable lymphadenopathy in the cervical, axillary or inguinal LUNGS: + Diminished to auscultation and percussion with normal breathing effort HEART: regular rate & rhythm and no murmurs and no lower extremity edema ABDOMEN: abdomen soft, non-tender and normal bowel sounds MUSCULOSKELETAL: no cyanosis of digits and no clubbing  PSYCH: alert & oriented x 3 with fluent speech NEURO: no focal motor/sensory deficits   All questions were answered. The patient knows to call the clinic with any problems, questions or concerns.   The total time spent in the appointment was 40 minutes encounter with patient including review of chart and various tests results, discussions about plan of care and coordination of care plan  [name], NP 12/04/2023 10:50 AM    Labs Reviewed:  Lab Results  Component Value Date   WBC 13.4 (H) 12/04/2023   HGB 13.9 12/04/2023   HCT 43.6 12/04/2023   MCV 90.8 12/04/2023   PLT 322 12/04/2023   Recent Labs    07/19/23 1927 07/19/23 2015 11/24/23 0506 11/25/23  9681 11/30/23 0300 11/30/23 0901 12/02/23 0621 12/03/23 0533 12/04/23 0522  NA 132*   < > 135   < > 139   < > 135 136 134*  K 4.3   < > 4.6   < > 4.6   < > 4.3 3.9 3.7  CL 97*   < > 100   < > 98   < > 100 100 101  CO2 25   < > 22   < > 27   < > 25 26 24   GLUCOSE 100*   < > 225*   < > 146*   < > 90 118* 113*  BUN 16   < > 16   < > 24*   < > 17 15 16   CREATININE 0.82   < > 1.02   < > 1.20   < > 0.84 0.85  0.85  CALCIUM  8.8*   < > 9.3   < > 9.9   < > 8.9 8.8* 8.9  GFRNONAA >60   < > >60   < > >60   < > >60 >60 >60  PROT 7.4  --  7.1  --  7.7  --   --   --   --   ALBUMIN 3.5  --  3.9  --  4.4  --   --   --   --   AST 20  --  21  --  22  --   --   --   --   ALT 15  --  12  --  29  --   --   --   --   ALKPHOS 110  --  113  --  111  --   --   --   --   BILITOT 0.4  --  0.2  --  0.3  --   --   --   --    < > = values in this interval not displayed.    Studies Reviewed:  CT Angio Chest PE W/Cm &/Or Wo Cm Result Date: 11/30/2023 EXAM: CTA CHEST 11/30/2023 05:22:30 AM TECHNIQUE: CTA of the chest was performed without and with the administration of 100 mL iohexol  (OMNIPAQUE ) 350 MG/ML injection. Multiplanar reformatted images are provided for review. MIP images are provided for review. Automated exposure control, iterative reconstruction, and/or weight based adjustment of the mA/kV was utilized to reduce the radiation dose to as low as reasonably achievable. COMPARISON: CT angiogram of the chest dated 07/20/2023. CLINICAL HISTORY: Pulmonary embolism (PE) suspected, high prob. Increased SOB, recent admission for COPD exacerbation, wbc's 16.5, GFR>60, hx of lung ca 09/2020, radiation to left lung; Port cxr prior to ct; 100 omni 350. FINDINGS: PULMONARY ARTERIES: Pulmonary arteries are adequately opacified for evaluation. No acute pulmonary embolus. Main pulmonary artery is normal in caliber. MEDIASTINUM: There is moderate calcific coronary artery disease. The pericardium demonstrates no acute abnormality. The thoracic aorta also demonstrates moderate calcific atheromatous disease. LYMPH NODES: There are numerous shotty mediastinal nodes, as before. No hilar or axillary lymphadenopathy. LUNGS AND PLEURA: There is moderate-to-severe central lobular emphysema present. There is extensive patchy opacification/consolidation of the lower lobes bilaterally and of the lingula. There are also patchy neuro bronchovascular  nodular opacities within the right middle lobe. No evidence of pleural effusion or pneumothorax. UPPER ABDOMEN: Simple-appearing exophytic cysts arising from the kidneys bilaterally. The abdominal aorta demonstrates moderate calcific atheromatous disease. SOFT TISSUES AND BONES: No acute bone or soft tissue abnormality. IMPRESSION: 1.  No evidence of pulmonary embolism. 2. Extensive multifocal pneumonia involving the lower lobes bilaterally, lingula, and right middle lobe. 3. Moderate-to-severe centrilobular emphysema. Electronically signed by: Evalene Coho MD 11/30/2023 05:50 AM EDT RP Workstation: [id number]   DG Chest Port 1 View Result Date: 11/30/2023 EXAM: 1 VIEW(S) XRAY OF THE CHEST 11/30/2023 03:07:00 AM COMPARISON: 11/23/2023 CLINICAL HISTORY: sob. Shortness of breath FINDINGS: LUNGS AND PLEURA: Patchy airspace opacities throughout left lung. Right lower lung zone airspace opacity. Small bilateral pleural effusions. No pulmonary edema. No pneumothorax. HEART AND MEDIASTINUM: No acute abnormality of the cardiac and mediastinal silhouettes. BONES AND SOFT TISSUES: No acute osseous abnormality. IMPRESSION: 1. Patchy airspace opacities throughout the left lung and in the right lower lung similar to 11/23/2023. 2. Small bilateral pleural effusions. Electronically signed by: Norman Gatlin MD 11/30/2023 03:19 AM EDT RP Workstation: [id number]   DG Chest Port 1 View Result Date: 11/23/2023 EXAM: 1 VIEW(S) XRAY OF THE CHEST 11/23/2023 11:24:06 PM COMPARISON: CTA chest dated 07/20/2023. CLINICAL HISTORY: shortness of breath. Per chart - c/o SOB for the past 3-4 days. Per EMS, pts initial oxygen  saturation was 88% on his baseline 8L [place]. Pt was given one Duoneb treatment with some relief. On arrival, labored breathing noted with retractions and pt is unable to speak in full ; sentences with out taking a breath. Pt denies any recent sick contacts, chest pain, fevers, chills, n/v/d. Dr. Cottie at bedside and  respiratory notified FINDINGS: LUNGS AND PLEURA: Stable multifocal opacities are present in the left mid/lower lung and right lower lung. While this has previously been considered suspicious for pneumonia, given the chronic appearance, multifocal semi-invasive peripheral adenocarcinoma (formally, bronchoalveolar cell carcinoma) should be considered. Associated small bilateral pleural effusions, chronic. No pulmonary edema. No pneumothorax. HEART AND MEDIASTINUM: No acute abnormality of the cardiac and mediastinal silhouettes. BONES AND SOFT TISSUES: No acute osseous abnormality. IMPRESSION: 1. Stable multifocal opacities in the left mid/lower lung and right lower lung. Given chronic appearance, multifocal semi-invasive peripheral adenocarcinoma should be considered. 2. Small bilateral pleural effusions, chronic. Electronically signed by: Pinkie Pebbles MD 11/23/2023 11:29 PM EDT RP Workstation: [id number]

## 2023-12-04 NOTE — Plan of Care (Signed)
  Problem: Education: Goal: Ability to describe self-care measures that may prevent or decrease complications (Diabetes Survival Skills Education) will improve Outcome: Progressing   Problem: Coping: Goal: Ability to adjust to condition or change in health will improve Outcome: Progressing   Problem: Metabolic: Goal: Ability to maintain appropriate glucose levels will improve Outcome: Progressing   Problem: Nutritional: Goal: Maintenance of adequate nutrition will improve Outcome: Progressing   Problem: Education: Goal: Knowledge of General Education information will improve Description: Including pain rating scale, medication(s)/side effects and non-pharmacologic comfort measures Outcome: Progressing   Problem: Clinical Measurements: Goal: Will remain free from infection Outcome: Progressing   Problem: Activity: Goal: Risk for activity intolerance will decrease Outcome: Progressing   Problem: Nutrition: Goal: Adequate nutrition will be maintained Outcome: Progressing   Problem: Safety: Goal: Ability to remain free from injury will improve Outcome: Progressing

## 2023-12-05 DIAGNOSIS — J188 Other pneumonia, unspecified organism: Secondary | ICD-10-CM | POA: Diagnosis not present

## 2023-12-05 DIAGNOSIS — C3490 Malignant neoplasm of unspecified part of unspecified bronchus or lung: Secondary | ICD-10-CM | POA: Diagnosis not present

## 2023-12-05 DIAGNOSIS — J441 Chronic obstructive pulmonary disease with (acute) exacerbation: Secondary | ICD-10-CM | POA: Diagnosis not present

## 2023-12-05 LAB — GLUCOSE, CAPILLARY
Glucose-Capillary: 109 mg/dL — ABNORMAL HIGH (ref 70–99)
Glucose-Capillary: 134 mg/dL — ABNORMAL HIGH (ref 70–99)
Glucose-Capillary: 240 mg/dL — ABNORMAL HIGH (ref 70–99)
Glucose-Capillary: 167 mg/dL — ABNORMAL HIGH (ref 70–99)

## 2023-12-05 LAB — CULTURE, BLOOD (ROUTINE X 2)
Culture: NO GROWTH
Culture: NO GROWTH
Special Requests: ADEQUATE
Special Requests: ADEQUATE

## 2023-12-05 MED ORDER — MUPIROCIN 2 % EX OINT
1.0000 | TOPICAL_OINTMENT | Freq: Two times a day (BID) | CUTANEOUS | Status: DC
  Administered 2023-12-05 – 2023-12-07 (×4): 1 via NASAL
  Filled 2023-12-05 (×2): qty 22

## 2023-12-05 MED ORDER — CHLORHEXIDINE GLUCONATE CLOTH 2 % EX PADS
6.0000 | MEDICATED_PAD | Freq: Every day | CUTANEOUS | Status: DC
  Administered 2023-12-06: 6 via TOPICAL

## 2023-12-05 MED ORDER — ACETAMINOPHEN 325 MG PO TABS
650.0000 mg | ORAL_TABLET | Freq: Four times a day (QID) | ORAL | Status: AC | PRN
  Administered 2023-12-05 – 2023-12-06 (×2): 650 mg via ORAL
  Filled 2023-12-05 (×2): qty 2

## 2023-12-05 NOTE — Progress Notes (Signed)
 PROGRESS NOTE    Mario [name]  FMW:[id number] DOB: May 27, 1950 DOA: 11/30/2023 PCP: [name]   Brief Narrative:  Mario [name] is a 73 y.o. male with medical history significant of ankylosing spondylitis, chronic hypoxemic respiratory failure in the setting of COPD on 8 L of oxygen  via nasal cannulae at home, depression with prior history of suicide attempt, left lung cancer s/p radiation therapy, hypertension, OSA, obesity, type 2 diabetes, CKD stage III A presented with worsening shortness of breath.  Patient had bronchoscopy with biopsy this past spring concerning for adenocarcinoma.  He was scheduled to follow-up locally with Dr. [name] but [facility] he did not show up to his appointment there and appears to have been lost to follow-up.  Patient was just seen in our facility and discharged on 9/30 for presumed COPD exacerbation, he appears to have been noncompliant with medications at home and reports back to our facility on 10/2 for worsening hypoxia from baseline.  Assessment & Plan:   Active Problems:   Goals of care, counseling/discussion   Multifocal pneumonia   Metastatic non-small cell lung cancer (HCC)  Acute on chronic hypoxemic respiratory failure in the setting of multifocal pneumonia Rule out evolving lung cancer Hemoptysis, transient -Patient recently admitted and discharged on 9/30 for COPD exacerbation  -Continue steroid taper -Continues to require oxygen  above prior baseline (8L) - now on 10-15L to keep sats >88 -Markedly abnormal CTA concerning for multifocal pneumonia, upon further evaluation and discussion with pulmonology his left lung changes appear to be secondary to prior radiation from lung cancer and right sided infiltrate is concerning for possible new malignancy given duration as it was present on imaging earlier this year as well as previously last year. - Patient was to be evaluated  locally by oncology for possible systemic treatment but declined and requested transfer of care and follow up at the [facility], it is unclear whether or not he underwent any treatment or evaluation at their facility. **No show note on Jul 11, 2023 at the [facility]- no notes after this date** - Given abnormal CTA here concerning for possible infiltrate will treat patient for multifocal pneumonia/HCAP He has now completed antibiotic course with no real improvement or change - he does not appear septic, does not meet sepsis criteria, remains afebrile -mild leukocytosis in the setting of recent steroid use - low threshold to discontinue antibiotics - Baseline oxygen  at this point is 8 L nasal cannula at home, he is minimally active due to profound dyspnea -Viral panel negative, cultures remain pending -Previous history of hemoptysis, continue to follow clinically - hold anticoagulation - Patient to follow up outpatient with Dr [name] after discharge.   Presumed recurrent/progressive lung cancer, POA - See above, oncology consulted for further insight and recommendations (patient previously refusing systemic treatment) - Unclear at this time whether or not patient has a clear avenue for treatment moving forward, will likely need to involve palliative care in the interim  Elevated troponin: - Likely type II, in the setting of hypoxia.   Prior history of left lung cancer - Status post radiation in the past.  Followed by pulmonology at [facility]   Ankylosing spondylitis - On Flexeril , gabapentin  and Norco-Will continue same   Depression with prior history of suicide attempt: - Continue bupropion , buspirone , duloxetine , oxcarbazepine  and trazodone    Hypertension: On diltiazem .  Will continue same   BPH: Continue Flomax    Type 2 diabetes: A1c 6.7.  Uncontrolled with hyperglycemia - On metformin  at home.  Hold for now.  Start sliding scale insulin    Stage III CKD: - Stable at baseline Morbid obesity with BMI of 36 -  Associated with hypertension, hyperlipidemia   Thrombocytosis: Likely reactive.  Will continue to monitor  DVT prophylaxis: SCDs Start: 11/30/23 0901 hold chemical prophylaxis in the setting of hemoptysis Code Status:   Code Status: Limited: Do not attempt resuscitation (DNR) -DNR-LIMITED -Do Not Intubate/DNI  Family Communication: None present  Status is: Inpatient  Dispo: The patient is from: Home              Anticipated d/c is to: To be determined              Anticipated d/c date is: To be determined              Patient currently not medically stable for discharge  Consultants:  Oncology  Procedures:  None  Antimicrobials:  Cefepime , vancomycin   Subjective: No acute issues or events overnight denies chest pain fevers chills nausea vomiting diarrhea constipation.  Shortness of breath ongoing with no real improvement, worsening cough with noted scant hemoptysis overnight as well.  Objective: Vitals:   12/03/23 2300 12/04/23 0453 12/04/23 1430 12/04/23 2024  BP:  (!) 155/91 136/82 135/61  Pulse: 97 94 92 92  Resp: 16 18 20 14   Temp:  (!) 97.5 F (36.4 C) 97.8 F (36.6 C) 97.7 F (36.5 C)  TempSrc:  Oral Oral Oral  SpO2: 93% 92%  95%  Weight:      Height:        Intake/Output Summary (Last 24 hours) at 12/05/2023 0755 Last data filed at 12/05/2023 0327 Gross per 24 hour  Intake --  Output 2175 ml  Net -2175 ml   Filed Weights   11/30/23 1900  Weight: 127.8 kg    Examination:  General:  Pleasantly resting in bed, No acute distress. HEENT:  Normocephalic atraumatic.  Sclerae nonicteric, noninjected.  Neck:  Without mass or deformity.  Trachea is midline. Lungs: Diminished bilaterally with diffuse rhonchi, no wheeze or rales noted Heart:  Regular rate and rhythm.  Without murmurs, rubs, or gallops. Abdomen:  Soft, he is, nontender, nondistended.  Without guarding or rebound. Extremities: Without cyanosis, clubbing, edema, or obvious deformity. Skin:  Warm  and dry, no erythema.  Data Reviewed: I have personally reviewed following labs and imaging studies  CBC: Recent Labs  Lab 11/30/23 0300 11/30/23 0901 12/01/23 0525 12/02/23 0859 12/03/23 0533 12/04/23 0522  WBC 16.5* 13.3* 17.5* 12.5* 12.4* 13.4*  NEUTROABS 12.1*  --   --   --   --   --   HGB 15.6 14.0 13.7 13.4 12.7* 13.9  HCT 49.2 44.0 44.7 42.6 41.3 43.6  MCV 91.8 91.1 93.1 93.4 92.0 90.8  PLT 431* 384 351 336 320 322   Basic Metabolic Panel: Recent Labs  Lab 11/30/23 0300 11/30/23 0901 12/01/23 0525 12/02/23 0621 12/03/23 0533 12/04/23 0522  NA 139  --  139 135 136 134*  K 4.6  --  4.2 4.3 3.9 3.7  CL 98  --  102 100 100 101  CO2 27  --  26 25 26 24   GLUCOSE 146*  --  114* 90 118* 113*  BUN 24*  --  21 17 15 16   CREATININE 1.20 0.96 0.98 0.84 0.85 0.85  CALCIUM  9.9  --  9.1 8.9 8.8* 8.9   GFR: Estimated Creatinine Clearance: 111.6 mL/min (by C-G  formula based on SCr of 0.85 mg/dL). Liver Function Tests: Recent Labs  Lab 11/30/23 0300  AST 22  ALT 29  ALKPHOS 111  BILITOT 0.3  PROT 7.7  ALBUMIN 4.4   BNP (last 3 results) Recent Labs    11/23/23 2315 11/30/23 0300  PROBNP 85.2 127.0   HbA1C: No results for input(s): HGBA1C in the last 72 hours.  CBG: Recent Labs  Lab 12/04/23 0713 12/04/23 1116 12/04/23 1713 12/04/23 2207 12/05/23 0735  GLUCAP 103* 136* 165* 149* 109*    Recent Results (from the past 240 hours)  MRSA Next Gen by PCR, Nasal     Status: Abnormal   Collection Time: 11/30/23  6:53 AM   Specimen: Nasal Mucosa; Nasal Swab  Result Value Ref Range Status   MRSA by PCR Next Gen DETECTED (A) NOT DETECTED Final    Comment: RESULT CALLED TO, READ BACK BY AND VERIFIED WITH:  [place], D 11/30/23 1126 AJ (NOTE) The GeneXpert MRSA Assay (FDA approved for NASAL specimens only), is one component of a comprehensive MRSA colonization surveillance program. It is not intended to diagnose MRSA infection nor to guide or monitor  treatment for MRSA infections. Test performance is not FDA approved in patients less than 67 years old. Performed at [facility], [address] [place], [place] [address]   Resp panel by RT-PCR (RSV, Flu A&B, Covid) Anterior Nasal Swab     Status: None   Collection Time: 11/30/23  9:00 AM   Specimen: Anterior Nasal Swab  Result Value Ref Range Status   SARS Coronavirus 2 by RT PCR NEGATIVE NEGATIVE Final    Comment: (NOTE) SARS-CoV-2 target nucleic acids are NOT DETECTED.  The SARS-CoV-2 RNA is generally detectable in upper respiratory specimens during the acute phase of infection. The lowest concentration of SARS-CoV-2 viral copies this assay can detect is 138 copies/mL. A negative result does not preclude SARS-Cov-2 infection and should not be used as the sole basis for treatment or other patient management decisions. A negative result may occur with  improper specimen collection/handling, submission of specimen other than nasopharyngeal swab, presence of viral mutation(s) within the areas targeted by this assay, and inadequate number of viral copies(<138 copies/mL). A negative result must be combined with clinical observations, patient history, and epidemiological information. The expected result is Negative.  Fact Sheet for Patients:  BloggerCourse.com  Fact Sheet for Healthcare Providers:  SeriousBroker.it  This test is no t yet approved or cleared by the [place]  FDA and  has been authorized for detection and/or diagnosis of SARS-CoV-2 by FDA under an Emergency Use Authorization (EUA). This EUA will remain  in effect (meaning this test can be used) for the duration of the COVID-19 declaration under Section 564(b)(1) of the Act, 21 U.S.C.section 360bbb-3(b)(1), unless the authorization is terminated  or revoked sooner.       Influenza A by PCR NEGATIVE NEGATIVE Final   Influenza B by PCR  NEGATIVE NEGATIVE Final    Comment: (NOTE) The Xpert Xpress SARS-CoV-2/FLU/RSV plus assay is intended as an aid in the diagnosis of influenza from Nasopharyngeal swab specimens and should not be used as a sole basis for treatment. Nasal washings and aspirates are unacceptable for Xpert Xpress SARS-CoV-2/FLU/RSV testing.  Fact Sheet for Patients: BloggerCourse.com  Fact Sheet for Healthcare Providers: SeriousBroker.it  This test is not yet approved or cleared by the [place]  FDA and has been authorized for detection and/or diagnosis of SARS-CoV-2 by FDA under an Emergency Use  Authorization (EUA). This EUA will remain in effect (meaning this test can be used) for the duration of the COVID-19 declaration under Section 564(b)(1) of the Act, 21 U.S.C. section 360bbb-3(b)(1), unless the authorization is terminated or revoked.     Resp Syncytial Virus by PCR NEGATIVE NEGATIVE Final    Comment: (NOTE) Fact Sheet for Patients: BloggerCourse.com  Fact Sheet for Healthcare Providers: SeriousBroker.it  This test is not yet approved or cleared by the [place]  FDA and has been authorized for detection and/or diagnosis of SARS-CoV-2 by FDA under an Emergency Use Authorization (EUA). This EUA will remain in effect (meaning this test can be used) for the duration of the COVID-19 declaration under Section 564(b)(1) of the Act, 21 U.S.C. section 360bbb-3(b)(1), unless the authorization is terminated or revoked.  Performed at [facility], [address] [place], [place] [address]   Culture, blood (routine x 2) Call MD if unable to obtain prior to antibiotics being given     Status: None (Preliminary result)   Collection Time: 11/30/23  5:37 PM   Specimen: BLOOD RIGHT HAND  Result Value Ref Range Status   Specimen Description   Final    BLOOD RIGHT HAND Performed  at [facility], [address] [place], [place] [address]    Special Requests   Final    BOTTLES DRAWN AEROBIC AND ANAEROBIC Blood Culture adequate volume Performed at [facility], [address], [place], [place] [address]    Culture   Final    NO GROWTH 4 DAYS Performed at [facility], [address] [place], [place] [address]    Report Status PENDING  Incomplete  Culture, blood (routine x 2) Call MD if unable to obtain prior to antibiotics being given     Status: None (Preliminary result)   Collection Time: 11/30/23  5:41 PM   Specimen: BLOOD LEFT HAND  Result Value Ref Range Status   Specimen Description   Final    BLOOD LEFT HAND Performed at [facility], [address] [place], [place] [address]    Special Requests   Final    BOTTLES DRAWN AEROBIC AND ANAEROBIC Blood Culture adequate volume Performed at [facility], [address] [place], [place] [address]    Culture   Final    NO GROWTH 4 DAYS Performed at [facility], [address], [place], [place] [address]    Report Status PENDING  Incomplete         Radiology Studies: No results found.       Scheduled Meds:  arformoterol   15 mcg Nebulization BID   And   umeclidinium bromide   1 puff Inhalation Daily   aspirin  EC  81 mg Oral Daily   buPROPion   300 mg Oral q morning   busPIRone   15 mg Oral q AM   cyclobenzaprine   20 mg Oral QHS   cycloSPORINE   1 drop Both Eyes Q12H   diltiazem   120 mg Oral Daily   DULoxetine   60 mg Oral BID   enoxaparin  (LOVENOX ) injection  60 mg Subcutaneous Q24H   fluticasone   2 spray Each Nare Daily   gabapentin   100 mg Oral TID   insulin  aspart  0-15 Units Subcutaneous TID WC   insulin  aspart  0-5 Units Subcutaneous QHS   Oxcarbazepine   900 mg Oral BID   pantoprazole   40 mg Oral Daily   predniSONE   40 mg Oral Q breakfast  tamsulosin   0.8 mg Oral q AM   traZODone   75 mg Oral QHS   Continuous  Infusions:  ceFEPime  (MAXIPIME ) IV 2 g (12/05/23 0626)   vancomycin  1,250 mg (12/05/23 0656)     LOS: 5 days   Time spent:  Elsie JAYSON [place], DO [facility]  If 7PM-7AM, please contact night-coverage [email or web address]  12/05/2023, 7:55 AM

## 2023-12-05 NOTE — Plan of Care (Signed)

## 2023-12-05 NOTE — Progress Notes (Signed)
   12/05/23 0000  BiPAP/CPAP/SIPAP  BiPAP/CPAP/SIPAP Pt Type Adult (self placement)  BiPAP/CPAP/SIPAP Resmed  Mask Type Full face mask  Dentures removed? Not applicable  Mask Size Large  Flow Rate 8 lpm  Patient Home Machine No  Patient Home Mask Yes  Patient Home Tubing No  Auto Titrate No  Press High Alarm 30 cmH2O  Press Low Alarm 5 cmH2O  Device Plugged into [employer] Power Outlet Yes

## 2023-12-05 NOTE — Progress Notes (Signed)
   12/05/23 2246  BiPAP/CPAP/SIPAP  BiPAP/CPAP/SIPAP Pt Type Adult  BiPAP/CPAP/SIPAP Resmed  Mask Type Full face mask  Dentures removed? Not applicable  Mask Size Large  Flow Rate 10 lpm  Patient Home Machine No  Patient Home Mask Yes  Patient Home Tubing No  Auto Titrate No  Device Plugged into RED Power Outlet Yes

## 2023-12-05 NOTE — Progress Notes (Signed)
 Patient was 85% on 8 liters of oxygen  while standing.  He could not catch his breath to walk.  Chiquita LULLA Longs, RN

## 2023-12-06 DIAGNOSIS — J188 Other pneumonia, unspecified organism: Secondary | ICD-10-CM

## 2023-12-06 DIAGNOSIS — C3490 Malignant neoplasm of unspecified part of unspecified bronchus or lung: Secondary | ICD-10-CM | POA: Diagnosis not present

## 2023-12-06 LAB — GLUCOSE, CAPILLARY
Glucose-Capillary: 128 mg/dL — ABNORMAL HIGH (ref 70–99)
Glucose-Capillary: 193 mg/dL — ABNORMAL HIGH (ref 70–99)
Glucose-Capillary: 169 mg/dL — ABNORMAL HIGH (ref 70–99)
Glucose-Capillary: 221 mg/dL — ABNORMAL HIGH (ref 70–99)

## 2023-12-06 MED ORDER — PREDNISONE 20 MG PO TABS
30.0000 mg | ORAL_TABLET | Freq: Every day | ORAL | Status: DC
  Administered 2023-12-07: 30 mg via ORAL
  Filled 2023-12-06: qty 1

## 2023-12-06 NOTE — TOC Progression Note (Signed)
 Transition of Care [facility]) - Progression Note    Patient Details  Name: Mario [name] MRN: [id number] Date of Birth: 1950-10-23  Transition of Care [facility]) CM/SW Contact  Doneta Glenys DASEN, RN Phone Number: 12/06/2023, 4:23 PM  Clinical Narrative:    CM called [employer] for oxygen . Left message.   Expected Discharge Plan: Home w Home Health Services Barriers to Discharge: Continued Medical Work up               Expected Discharge Plan and Services       Living arrangements for the past 2 months: Single Family Home                                       Social Drivers of Health (SDOH) Interventions SDOH Screenings   Food Insecurity: No Food Insecurity (12/03/2023)  Housing: Low Risk  (12/03/2023)  Transportation Needs: No Transportation Needs (12/03/2023)  Recent Concern: Transportation Needs - Unmet Transportation Needs (11/30/2023)  Utilities: Not At Risk (12/03/2023)  Financial Resource Strain: Low Risk  (09/23/2020)  Social Connections: Socially Isolated (11/30/2023)  Stress: No Stress Concern Present (09/23/2020)  Tobacco Use: Medium Risk (11/30/2023)    Readmission Risk Interventions    12/03/2023    6:04 PM 11/27/2023    8:36 AM 05/06/2023    3:25 PM  Readmission Risk Prevention Plan  Transportation Screening Complete Complete Complete  Medication Review Oceanographer) Complete Complete Complete  PCP or Specialist appointment within 3-5 days of discharge Complete  Complete  HRI or Home Care Consult Complete Complete Complete  SW Recovery Care/Counseling Consult Complete Complete Complete  Palliative Care Screening Not Applicable Not Applicable Not Applicable  Skilled Nursing Facility Not Applicable Not Applicable Not Applicable

## 2023-12-06 NOTE — Progress Notes (Signed)
   12/06/23 2307  BiPAP/CPAP/SIPAP  BiPAP/CPAP/SIPAP Pt Type Adult  BiPAP/CPAP/SIPAP Resmed  Mask Type Full face mask  Dentures removed? Not applicable  Mask Size Large  Flow Rate 11 lpm  Patient Home Machine No  Patient Home Mask Yes  Patient Home Tubing No  Auto Titrate No  Device Plugged into RED Power Outlet Yes

## 2023-12-06 NOTE — Evaluation (Addendum)
 Physical Therapy Evaluation Patient Details Name: Mario [name] MRN: [id number] DOB: 1950/07/15 Today's Date: 12/06/2023  History of Present Illness  Pt is a 73 y.o. male presenting to [facility] with increased shortness of breath with productive cough,. Pt has had multiple recent admissions due to acute restpiraotry failure. DC on 9/30. Pt is chronically hypoxic and on 6 L/min supplemental O2, imaging reveals PNA with COPD changes. Pt PMH Includes but is not limited to: non-small cell lung cancer status post SBRT, vasculitis and ankylosing spondylitis, PE on Eliquis , OSA with CPAP intolerance, type 2 diabetes mellitus, chronic pain, depression, anxiety, and COPD with chronic hypoxic respiratory failure.  Clinical Impression   The patient reports  that he recently performed walk test with SPO2 dropping to 79% on  RA and 85% on 9 L. Patient resting on 9 L 92%. Patient stood at bed side with no support, SPO2 dropped to 89%, while standing an talking with noted  Dyspnea. Patient reports uses 8 L HFNC but now requiring 9 L HFNC to keep SPO2 > 90%. Patient reports using rollator in his home so he can  sit down PRN, Patient does have to negotiate 5 steps which he has placed chairs at top and bottom.  Patient has HHPT on board and recommend continuation. Pt admitted with above diagnosis.  Pt currently with functional limitations due to the deficits listed below (see PT Problem List). Pt will benefit from acute skilled PT to increase their independence and safety with mobility to allow discharge.         If plan is discharge home, recommend the following: A little help with bathing/dressing/bathroom;Assistance with cooking/housework;Assist for transportation;Help with stairs or ramp for entrance   Can travel by private vehicle        Equipment Recommendations  (motorized WC/scooter)  Recommendations for Other Services       Functional Status Assessment Patient has had a recent decline in their  functional status and demonstrates the ability to make significant improvements in function in a reasonable and predictable amount of time.     Precautions / Restrictions Precautions Precautions: Fall;Other (comment) Recall of Precautions/Restrictions: Intact Precaution/Restrictions Comments: Pulmonary: Mon O2 Restrictions Weight Bearing Restrictions Per Provider Order: No      Mobility  Bed Mobility Overal bed mobility: Independent                  Transfers Overall transfer level: Independent                 General transfer comment: no device requird to stand    Ambulation/Gait               General Gait Details: deferred 2* 4/4 dyspnea with sit to stand, rcently amb w/ nsg  with drop to 79%  Stairs            Wheelchair Mobility     Tilt Bed    Modified Rankin (Stroke Patients Only)       Balance Overall balance assessment: Mild deficits observed, not formally tested                                           Pertinent Vitals/Pain Pain Assessment Pain Assessment: No/denies pain    Home Living Family/patient expects to be discharged to:: Private residence Living Arrangements: Non-relatives/Friends Available Help at Discharge: Available 24 hours/day Type of Home: House Home  Access: Level entry     Alternate Level Stairs-Number of Steps: Split Level : enter on main level with kitchen/living  up 5 steps to bedroom and bathroom Home Layout: Two level Home Equipment: Cane - single point;Shower Counsellor (2 wheels);Rollator (4 wheels);Other (comment);Hand held shower head;Adaptive equipment Additional Comments: housemate lives across the hall helps too; [place] aide comes 5 hours/day 3 days/week helps with cleaning; [place] for transportation (though recently couldn't walk to the [place]). On 8L O2 at home    Prior Function Prior Level of Function : Needs assist             Mobility Comments: Ambulates in home;  gets SOB easily, seated rest breaks to recover; walks with AD; no falls in past 6 months. On 8L O2 at home. ADLs Comments: sits to shower, showering is very taxing; aide helps with dressing, pt showers independently with Aide nearby for safety. Housemate does havey housework and Aide does light housework. Aide does meal prep and laundry but cannot provide transportation.     Extremity/Trunk Assessment        Lower Extremity Assessment Lower Extremity Assessment: Overall WFL for tasks assessed LLE Deficits / Details: reports 2 weeks of R worse than L numbness in B feet, especially medially and on bottoms of feet LLE Sensation: decreased light touch    Cervical / Trunk Assessment Cervical / Trunk Assessment: Normal  Communication   Communication Communication: Impaired Factors Affecting Communication: Reduced clarity of speech (due to SOB)    Cognition Arousal: Alert Behavior During Therapy: WFL for tasks assessed/performed   PT - Cognitive impairments: No apparent impairments                         Following commands: Intact       Cueing       General Comments      Exercises     Assessment/Plan    PT Assessment Patient needs continued PT services  PT Problem List Decreased activity tolerance;Decreased mobility       PT Treatment Interventions Gait training;Therapeutic exercise;Therapeutic activities;Functional mobility training;Patient/family education    PT Goals (Current goals can be found in the Care Plan section)  Acute Rehab PT Goals Patient Stated Goal: return home, be able to do stairs and walk household distances without getting so short of breath PT Goal Formulation: With patient Time For Goal Achievement: 12/20/23 Potential to Achieve Goals: Fair    Frequency Min 3X/week     Co-evaluation               AM-PAC PT 6 Clicks Mobility  Outcome Measure Help needed turning from your back to your side while in a flat bed without  using bedrails?: None Help needed moving from lying on your back to sitting on the side of a flat bed without using bedrails?: None Help needed moving to and from a bed to a chair (including a wheelchair)?: None Help needed standing up from a chair using your arms (e.g., wheelchair or bedside chair)?: A Little Help needed to walk in hospital room?: A Little Help needed climbing 3-5 steps with a railing? : A Lot 6 Click Score: 20    End of Session Equipment Utilized During Treatment: Oxygen  Activity Tolerance: Treatment limited secondary to medical complications (Comment) Patient left: in bed;with call bell/phone within reach Nurse Communication: Mobility status PT Visit Diagnosis: Difficulty in walking, not elsewhere classified (R26.2)    Time: 8442-8384 PT Time Calculation (  min) (ACUTE ONLY): 18 min   Charges:   PT Evaluation $PT Eval Low Complexity: 1 Low   PT General Charges $$ ACUTE PT VISIT: 1 Visit         Darice Potters PT Acute Rehabilitation Services Office [phone]   Potters Darice Norris 12/06/2023, 4:34 PM

## 2023-12-06 NOTE — Progress Notes (Cosign Needed Addendum)
 SATURATION QUALIFICATIONS: (This note is used to comply with regulatory documentation for home oxygen )  Patient Saturations on Room Air at Rest = 79%  Patient Saturations on 8 Liters of oxygen  while Ambulating = 85%  Please briefly explain why patient needs home oxygen : At rest sitting with 8L he's 92%, he desats during ambulation, gets dizzy, and needs to sit down immediately.

## 2023-12-06 NOTE — Progress Notes (Signed)
 PROGRESS NOTE    Mario [name]  FMW:[id number] DOB: April 29, 1950 DOA: 11/30/2023 PCP: [name]   Brief Narrative:  Mario [name] is a 73 y.o. male with medical history significant of ankylosing spondylitis, chronic hypoxemic respiratory failure in the setting of COPD on 8 L of oxygen  via nasal cannulae at home, depression with prior history of suicide attempt, left lung cancer s/p radiation therapy, hypertension, OSA, obesity, type 2 diabetes, CKD stage III A presented with worsening shortness of breath.   Patient had bronchoscopy with biopsy this past spring concerning for adenocarcinoma.  He was scheduled to follow-up locally with Dr. [name] but [facility] he did not show up to his appointment there and appears to have been lost to follow-up.   Patient was just seen in our facility and discharged on 9/30 for presumed COPD exacerbation, he appears to have been noncompliant with medications at home and reports back to our facility on 10/2 for worsening hypoxia from baseline.  Assessment & Plan:   Active Problems:   Goals of care, counseling/discussion   Multifocal pneumonia   Metastatic non-small cell lung cancer (HCC)  Acute on chronic hypoxemic respiratory failure in the setting of multifocal pneumonia / Rule out evolving lung cancer / Hemoptysis, transient -Patient recently admitted and discharged on 9/30 for COPD exacerbation. Markedly abnormal CTA concerning for multifocal pneumonia, upon further evaluation and discussion with pulmonology his left lung changes appear to be secondary to prior radiation from lung cancer and right sided infiltrate is concerning for possible new malignancy given duration as it was present on imaging earlier this year as well as previously last year. - Patient was to be evaluated locally by oncology for possible systemic treatment but declined and requested transfer of care and follow up at the  [facility], it is unclear whether or not he underwent any treatment or evaluation at their facility. **No show note on Jul 11, 2023 at the [place]- no notes after this date** - Given abnormal CTA here concerning for possible infiltrate will treat patient for multifocal pneumonia/HCAP He has now completed antibiotic course with no real improvement or change -  -Continues to require oxygen  above prior baseline (8L) - now on 9-10L to keep sats >90 -Previous history of hemoptysis, continue to follow clinically - hold anticoagulation - Patient to follow up outpatient with Dr [name] after discharge.   Presumed recurrent/progressive lung cancer, POA - See above, oncology consulted for further insight and recommendations (patient previously refusing systemic treatment) - Unclear at this time whether or not patient has a clear avenue for treatment moving forward, will likely need to involve palliative care in the interim.  Per oncology, he will need to follow-up with them as outpatient.   Elevated troponin: - Likely type II, in the setting of hypoxia.   Prior history of left lung cancer - Status post radiation in the past.  Followed by pulmonology at [facility]   Ankylosing spondylitis - On Flexeril , gabapentin  and Norco-Will continue same   Depression with prior history of suicide attempt: - Continue bupropion , buspirone , duloxetine , oxcarbazepine  and trazodone    Hypertension: On diltiazem .  Will continue same   BPH: Continue Flomax    Type 2 diabetes: A1c 6.7.  Uncontrolled with hyperglycemia - On metformin  at home.  Hold for now.  Continue sliding scale insulin    Stage III CKD: - Stable at baseline  Morbid obesity with BMI of 36 - Associated with hypertension, hyperlipidemia, weight  loss and diet modification counseled.   Thrombocytosis: Likely reactive.  Will continue to monitor  Generalized weakness: Patient has been hospitalized for more than a week, has not been seen by PT OT.  I have consulted them  today.  DVT prophylaxis: SCDs Start: 11/30/23 0901   Code Status: Limited: Do not attempt resuscitation (DNR) -DNR-LIMITED -Do Not Intubate/DNI   Family Communication:  None present at bedside.  Plan of care discussed with patient in length and he/she verbalized understanding and agreed with it.  Status is: Inpatient Remains inpatient appropriate because: Very weak.   Estimated body mass index is 36.17 kg/m as calculated from the following:   Height as of this encounter: 6' 2 (1.88 m).   Weight as of this encounter: 127.8 kg.    Nutritional Assessment: Body mass index is 36.17 kg/m.SABRA Seen by dietician.  I agree with the assessment and plan as outlined below: Nutrition Status:        . Skin Assessment: I have examined the patient's skin and I agree with the wound assessment as performed by the wound care RN as outlined below:    Consultants:  Pulmonology and oncology  Procedures:  As above  Antimicrobials:  Anti-infectives (From admission, onward)    Start     Dose/Rate Route Frequency Ordered Stop   12/02/23 1800  vancomycin  (VANCOREADY) IVPB 1250 mg/250 mL  Status:  Discontinued        1,250 mg 166.7 mL/hr over 90 Minutes Intravenous Every 12 hours 12/02/23 0931 12/05/23 1139   11/30/23 2200  vancomycin  (VANCOREADY) IVPB 2000 mg/400 mL  Status:  Discontinued        2,000 mg 200 mL/hr over 120 Minutes Intravenous Daily at bedtime 11/30/23 0654 12/02/23 0931   11/30/23 1400  ceFEPIme  (MAXIPIME ) 2 g in sodium chloride  0.9 % 100 mL IVPB  Status:  Discontinued        2 g 200 mL/hr over 30 Minutes Intravenous Every 8 hours 11/30/23 0638 12/05/23 1139   11/30/23 0630  vancomycin  (VANCOREADY) IVPB 2000 mg/400 mL        2,000 mg 200 mL/hr over 120 Minutes Intravenous  Once 11/30/23 0555 11/30/23 0900   11/30/23 0600  vancomycin  (VANCOCIN ) IVPB 1000 mg/200 mL premix  Status:  Discontinued        1,000 mg 200 mL/hr over 60 Minutes Intravenous  Once 11/30/23 0554 11/30/23  0555   11/30/23 0600  ceFEPIme  (MAXIPIME ) 2 g in sodium chloride  0.9 % 100 mL IVPB        2 g 200 mL/hr over 30 Minutes Intravenous  Once 11/30/23 0554 11/30/23 0636         Subjective: Patient seen and examined, he says that he is still having some hemoptysis with pinkish sputum but improving.  Still has shortness of breath with exertion.  When I saw him, he was lying in the bed still requiring 9 L of oxygen .  Objective: Vitals:   12/06/23 0716 12/06/23 0748 12/06/23 1018 12/06/23 1340  BP: (!) 150/90  (!) 150/90 123/73  Pulse: 90   (!) 115  Resp: (!) 22   20  Temp: (!) 97.5 F (36.4 C)   98.2 F (36.8 C)  TempSrc: Oral   Oral  SpO2: 95% 93%  (!) 86%  Weight:      Height:        Intake/Output Summary (Last 24 hours) at 12/06/2023 1410 Last data filed at 12/05/2023 2100 Gross per 24 hour  Intake --  Output 725 ml  Net -725 ml   Filed Weights   11/30/23 1900  Weight: 127.8 kg    Examination:  General exam: Appears calm and comfortable, morbidly obese Respiratory system: Scattered rhonchi. Respiratory effort normal. Cardiovascular system: S1 & S2 heard, RRR. No JVD, murmurs, rubs, gallops or clicks. No pedal edema. Gastrointestinal system: Abdomen is nondistended, soft and nontender. No organomegaly or masses felt. Normal bowel sounds heard. Central nervous system: Alert and oriented. No focal neurological deficits. Extremities: Symmetric 5 x 5 power. Skin: No rashes, lesions or ulcers Psychiatry: Judgement and insight appear normal. Mood & affect appropriate.    Data Reviewed: I have personally reviewed following labs and imaging studies  CBC: Recent Labs  Lab 11/30/23 0300 11/30/23 0901 12/01/23 0525 12/02/23 0859 12/03/23 0533 12/04/23 0522  WBC 16.5* 13.3* 17.5* 12.5* 12.4* 13.4*  NEUTROABS 12.1*  --   --   --   --   --   HGB 15.6 14.0 13.7 13.4 12.7* 13.9  HCT 49.2 44.0 44.7 42.6 41.3 43.6  MCV 91.8 91.1 93.1 93.4 92.0 90.8  PLT 431* 384 351 336 320  322   Basic Metabolic Panel: Recent Labs  Lab 11/30/23 0300 11/30/23 0901 12/01/23 0525 12/02/23 0621 12/03/23 0533 12/04/23 0522  NA 139  --  139 135 136 134*  K 4.6  --  4.2 4.3 3.9 3.7  CL 98  --  102 100 100 101  CO2 27  --  26 25 26 24   GLUCOSE 146*  --  114* 90 118* 113*  BUN 24*  --  21 17 15 16   CREATININE 1.20 0.96 0.98 0.84 0.85 0.85  CALCIUM  9.9  --  9.1 8.9 8.8* 8.9   GFR: Estimated Creatinine Clearance: 111.6 mL/min (by C-G formula based on SCr of 0.85 mg/dL). Liver Function Tests: Recent Labs  Lab 11/30/23 0300  AST 22  ALT 29  ALKPHOS 111  BILITOT 0.3  PROT 7.7  ALBUMIN 4.4   No results for input(s): LIPASE, AMYLASE in the last 168 hours. No results for input(s): AMMONIA in the last 168 hours. Coagulation Profile: No results for input(s): INR, PROTIME in the last 168 hours. Cardiac Enzymes: No results for input(s): CKTOTAL, CKMB, CKMBINDEX, TROPONINI in the last 168 hours. BNP (last 3 results) Recent Labs    11/23/23 2315 11/30/23 0300  PROBNP 85.2 127.0   HbA1C: No results for input(s): HGBA1C in the last 72 hours. CBG: Recent Labs  Lab 12/05/23 1138 12/05/23 1627 12/05/23 2054 12/06/23 0736 12/06/23 1130  GLUCAP 134* 240* 167* 128* 193*   Lipid Profile: No results for input(s): CHOL, HDL, LDLCALC, TRIG, CHOLHDL, LDLDIRECT in the last 72 hours. Thyroid  Function Tests: No results for input(s): TSH, T4TOTAL, FREET4, T3FREE, THYROIDAB in the last 72 hours. Anemia Panel: No results for input(s): VITAMINB12, FOLATE, FERRITIN, TIBC, IRON, RETICCTPCT in the last 72 hours. Sepsis Labs: No results for input(s): PROCALCITON, LATICACIDVEN in the last 168 hours.  Recent Results (from the past 240 hours)  MRSA Next Gen by PCR, Nasal     Status: Abnormal   Collection Time: 11/30/23  6:53 AM   Specimen: Nasal Mucosa; Nasal Swab  Result Value Ref Range Status   MRSA by PCR Next Gen  DETECTED (A) NOT DETECTED Final    Comment: RESULT CALLED TO, READ BACK BY AND VERIFIED WITH:  [place], D 11/30/23 1126 AJ (NOTE) The GeneXpert MRSA Assay (FDA approved for NASAL specimens only), is one component of a comprehensive MRSA  colonization surveillance program. It is not intended to diagnose MRSA infection nor to guide or monitor treatment for MRSA infections. Test performance is not FDA approved in patients less than 27 years old. Performed at [facility], [address] [place], [place] [address]   Resp panel by RT-PCR (RSV, Flu A&B, Covid) Anterior Nasal Swab     Status: None   Collection Time: 11/30/23  9:00 AM   Specimen: Anterior Nasal Swab  Result Value Ref Range Status   SARS Coronavirus 2 by RT PCR NEGATIVE NEGATIVE Final    Comment: (NOTE) SARS-CoV-2 target nucleic acids are NOT DETECTED.  The SARS-CoV-2 RNA is generally detectable in upper respiratory specimens during the acute phase of infection. The lowest concentration of SARS-CoV-2 viral copies this assay can detect is 138 copies/mL. A negative result does not preclude SARS-Cov-2 infection and should not be used as the sole basis for treatment or other patient management decisions. A negative result may occur with  improper specimen collection/handling, submission of specimen other than nasopharyngeal swab, presence of viral mutation(s) within the areas targeted by this assay, and inadequate number of viral copies(<138 copies/mL). A negative result must be combined with clinical observations, patient history, and epidemiological information. The expected result is Negative.  Fact Sheet for Patients:  BloggerCourse.com  Fact Sheet for Healthcare Providers:  SeriousBroker.it  This test is no t yet approved or cleared by the [place]  FDA and  has been authorized for detection and/or diagnosis of SARS-CoV-2 by FDA under an  Emergency Use Authorization (EUA). This EUA will remain  in effect (meaning this test can be used) for the duration of the COVID-19 declaration under Section 564(b)(1) of the Act, 21 U.S.C.section 360bbb-3(b)(1), unless the authorization is terminated  or revoked sooner.       Influenza A by PCR NEGATIVE NEGATIVE Final   Influenza B by PCR NEGATIVE NEGATIVE Final    Comment: (NOTE) The Xpert Xpress SARS-CoV-2/FLU/RSV plus assay is intended as an aid in the diagnosis of influenza from Nasopharyngeal swab specimens and should not be used as a sole basis for treatment. Nasal washings and aspirates are unacceptable for Xpert Xpress SARS-CoV-2/FLU/RSV testing.  Fact Sheet for Patients: BloggerCourse.com  Fact Sheet for Healthcare Providers: SeriousBroker.it  This test is not yet approved or cleared by the [place]  FDA and has been authorized for detection and/or diagnosis of SARS-CoV-2 by FDA under an Emergency Use Authorization (EUA). This EUA will remain in effect (meaning this test can be used) for the duration of the COVID-19 declaration under Section 564(b)(1) of the Act, 21 U.S.C. section 360bbb-3(b)(1), unless the authorization is terminated or revoked.     Resp Syncytial Virus by PCR NEGATIVE NEGATIVE Final    Comment: (NOTE) Fact Sheet for Patients: BloggerCourse.com  Fact Sheet for Healthcare Providers: SeriousBroker.it  This test is not yet approved or cleared by the [place]  FDA and has been authorized for detection and/or diagnosis of SARS-CoV-2 by FDA under an Emergency Use Authorization (EUA). This EUA will remain in effect (meaning this test can be used) for the duration of the COVID-19 declaration under Section 564(b)(1) of the Act, 21 U.S.C. section 360bbb-3(b)(1), unless the authorization is terminated or revoked.  Performed at [facility], [address] [place], [place] [address]   Culture, blood (routine x 2) Call MD if unable to obtain prior to antibiotics being given     Status: None   Collection Time: 11/30/23  5:37 PM  Specimen: BLOOD RIGHT HAND  Result Value Ref Range Status   Specimen Description   Final    BLOOD RIGHT HAND Performed at [facility], [address] [place], [place] [address]    Special Requests   Final    BOTTLES DRAWN AEROBIC AND ANAEROBIC Blood Culture adequate volume Performed at [facility], [address], [place], [place] [address]    Culture   Final    NO GROWTH 5 DAYS Performed at [facility], [address] [place], [place] [address]    Report Status 12/05/2023 FINAL  Final  Culture, blood (routine x 2) Call MD if unable to obtain prior to antibiotics being given     Status: None   Collection Time: 11/30/23  5:41 PM   Specimen: BLOOD LEFT HAND  Result Value Ref Range Status   Specimen Description   Final    BLOOD LEFT HAND Performed at [facility], [address] [place], [place] [address]    Special Requests   Final    BOTTLES DRAWN AEROBIC AND ANAEROBIC Blood Culture adequate volume Performed at [facility], [address], [place], [place] [address]    Culture   Final    NO GROWTH 5 DAYS Performed at [facility], [address], [place], [place] [address]    Report Status 12/05/2023 FINAL  Final     Radiology Studies: No results found.  Scheduled Meds:  arformoterol   15 mcg Nebulization BID   And   umeclidinium bromide   1 puff Inhalation Daily   aspirin  EC  81 mg Oral Daily   buPROPion   300 mg Oral q morning   busPIRone   15 mg Oral q AM   Chlorhexidine  Gluconate Cloth  6 each Topical Daily   cyclobenzaprine   20 mg Oral QHS   cycloSPORINE   1 drop Both Eyes Q12H   diltiazem   120 mg Oral Daily   DULoxetine   60 mg Oral BID   enoxaparin  (LOVENOX ) injection  60 mg Subcutaneous Q24H    fluticasone   2 spray Each Nare Daily   gabapentin   100 mg Oral TID   insulin  aspart  0-15 Units Subcutaneous TID WC   insulin  aspart  0-5 Units Subcutaneous QHS   mupirocin  ointment  1 Application Nasal BID   Oxcarbazepine   900 mg Oral BID   pantoprazole   40 mg Oral Daily   predniSONE   40 mg Oral Q breakfast   tamsulosin   0.8 mg Oral q AM   traZODone   75 mg Oral QHS   Continuous Infusions:   LOS: 6 days   Fredia Skeeter, MD [facility]  12/06/2023, 2:10 PM   *Please note that this is a verbal dictation therefore any spelling or grammatical errors are due to the Dragon Medical One system interpretation.  Please page via Amion and do not message via secure chat for urgent patient care matters. Secure chat can be used for non urgent patient care matters.  How to contact the [facility] Attending or Consulting provider 7A - 7P or covering provider during after hours 7P -7A, for this patient?  Check the care team in [facility] and look for a) attending/consulting [facility] provider listed and b) the [facility] team listed. Page or secure chat 7A-7P. Log into [email or web address] and use 's universal password to access. If you do not have the password, please contact the hospital operator. Locate the [facility] provider you are looking for under [facility] and  page to a number that you can be directly reached. If you still have difficulty reaching the provider, please page the [facility] (Director on Call) for the Hospitalists listed on amion for assistance.

## 2023-12-06 NOTE — Progress Notes (Signed)
 MEWS Progress Note  Patient Details Name: ZAVION SLEIGHT MRN: [id number] DOB: 15-Jul-1950 Today's Date: 12/06/2023   MEWS Flowsheet Documentation:  Assess: MEWS Score Temp: 98.2 F (36.8 C) BP: 123/73 MAP (mmHg): 89 Pulse Rate: (!) 115 ECG Heart Rate: 99 Resp: 20 Level of Consciousness: Alert SpO2: (!) 86 % O2 Device: Room Air Patient Activity (if Appropriate): In bed O2 Flow Rate (L/min): 9 L/min FiO2 (%): (!) 10 % Assess: MEWS Score MEWS Temp: 0 MEWS Systolic: 0 MEWS Pulse: 2 MEWS RR: 0 MEWS LOC: 0 MEWS Score: 2 MEWS Score Color: Yellow Assess: SIRS CRITERIA SIRS Temperature : 0 SIRS Respirations : 0 SIRS Pulse: 1 SIRS WBC: 0 SIRS Score Sum : 1 Assess: if the MEWS score is Yellow or Red Were vital signs accurate and taken at a resting state?: Yes Does the patient meet 2 or more of the SIRS criteria?: Yes Does the patient have a confirmed or suspected source of infection?: Yes MEWS guidelines implemented : Yes, yellow Treat MEWS Interventions: Considered administering scheduled or prn medications/treatments as ordered Take Vital Signs Increase Vital Sign Frequency : Yellow: Q2hr x1, continue Q4hrs until patient remains green for 12hrs Escalate MEWS: Escalate: Yellow: Discuss with charge nurse and consider notifying provider and/or RRT Notify: Charge Nurse/RN Name of Charge Nurse/RN Notified: Radio broadcast assistant Provider Notification Provider Name/Title: Vernon Date Provider Notified: 12/06/23 Time Provider Notified: 1349 Method of Notification: Page Notification Reason: Change in status Provider response: See new orders Date of Provider Response: 12/06/23 Time of Provider Response: 1350 Notify: Rapid Response Name of Rapid Response RN Notified:  (na) Date Rapid Response Notified: 12/06/23 Time Rapid Response Notified: 1350      Meyli Boice G Matylda Fehring 12/06/2023, 1:50 PM

## 2023-12-07 ENCOUNTER — Other Ambulatory Visit (HOSPITAL_COMMUNITY): Payer: Self-pay

## 2023-12-07 DIAGNOSIS — J188 Other pneumonia, unspecified organism: Secondary | ICD-10-CM | POA: Diagnosis not present

## 2023-12-07 DIAGNOSIS — C3490 Malignant neoplasm of unspecified part of unspecified bronchus or lung: Secondary | ICD-10-CM | POA: Diagnosis not present

## 2023-12-07 LAB — BASIC METABOLIC PANEL WITH GFR
Anion gap: 7 (ref 5–15)
BUN: 15 mg/dL (ref 8–23)
CO2: 32 mmol/L (ref 22–32)
Calcium: 9.3 mg/dL (ref 8.9–10.3)
Chloride: 97 mmol/L — ABNORMAL LOW (ref 98–111)
Creatinine, Ser: 0.94 mg/dL (ref 0.61–1.24)
GFR, Estimated: >60 mL/min (ref >60)
Glucose, Bld: 106 mg/dL — ABNORMAL HIGH (ref 70–99)
Potassium: 4.8 mmol/L (ref 3.5–5.1)
Sodium: 136 mmol/L (ref 135–145)

## 2023-12-07 LAB — CREATININE, SERUM
Creatinine, Ser: 0.94 mg/dL (ref 0.61–1.24)
GFR, Estimated: >60 mL/min (ref >60)

## 2023-12-07 LAB — GLUCOSE, CAPILLARY
Glucose-Capillary: 110 mg/dL — ABNORMAL HIGH (ref 70–99)
Glucose-Capillary: 177 mg/dL — ABNORMAL HIGH (ref 70–99)

## 2023-12-07 LAB — PRO BRAIN NATRIURETIC PEPTIDE: Pro Brain Natriuretic Peptide: 52.1 pg/mL (ref <300.0)

## 2023-12-07 MED ORDER — FUROSEMIDE 10 MG/ML IJ SOLN
40.0000 mg | Freq: Once | INTRAMUSCULAR | Status: AC
  Administered 2023-12-07: 40 mg via INTRAVENOUS
  Filled 2023-12-07: qty 4

## 2023-12-07 MED ORDER — PREDNISONE 10 MG PO TABS
ORAL_TABLET | ORAL | 0 refills | Status: DC
  Filled 2023-12-07: qty 18, 9d supply, fill #0

## 2023-12-07 NOTE — Progress Notes (Signed)
 AVS reviewed with patient who verbalized an understanding. PIV removed as noted. Patient's ride will be here at 1500. Pt dressing for discharge to home. No other questions at this time

## 2023-12-07 NOTE — Plan of Care (Signed)

## 2023-12-07 NOTE — Progress Notes (Signed)
 Discharge medication delivered to patient at the bedside in a secure bag

## 2023-12-07 NOTE — Evaluation (Signed)
 Occupational Therapy Evaluation Patient Details Name: Mario [name] MRN: [id number] DOB: 1950-04-06 Today's Date: 12/07/2023   History of Present Illness   Pt is a 73 y.o. male presenting to [facility] with increased shortness of breath with productive cough,. Pt has had multiple recent admissions due to acute restpiraotry failure. DC on 9/30. Pt is chronically hypoxic and on 6 L/min supplemental O2, imaging reveals PNA with COPD changes. Pt PMH Includes but is not limited to: non-small cell lung cancer status post SBRT, vasculitis and ankylosing spondylitis, PE on Eliquis , OSA with CPAP intolerance, type 2 diabetes mellitus, chronic pain, depression, anxiety, and COPD with chronic hypoxic respiratory failure.     Clinical Impressions Pt c/o SOB, fatigue, sitting EOB throughout session, O2 saturation around 88-91% sitting and with light activity on 9L O2 via [place]. Pt lives alone, has neighbor who assists with cleaning, PCA 15 hrs/week for IADLs. Pt reports at times it takes him 2 hours to shower and is barely able to get around, looking for assisted living. Pt currently demos significantly limited activity tolerance, overall fair strength/ROM but limited due to SOB. Pt would benefit from continued acute OT to maximize safety with upright activities and activity tolerance, energy conservation, HHOT follow up recommended. Pt reports his toilet seat riser with handles is breaking, would benefit from a new one, would like to see it before he gets it to make sure it's large enough for him.      If plan is discharge home, recommend the following:   A little help with walking and/or transfers;A little help with bathing/dressing/bathroom;Assistance with cooking/housework;Assist for transportation     Functional Status Assessment   Patient has had a recent decline in their functional status and demonstrates the ability to make significant improvements in function in a reasonable and predictable amount of  time.     Equipment Recommendations   Other (comment) (toilet seat riser with handles, Pt would like to see it prior to getting it if possible. If not, he will order himself)     Recommendations for Other Services         Precautions/Restrictions   Precautions Precautions: Fall;Other (comment) Recall of Precautions/Restrictions: Intact Precaution/Restrictions Comments: Pulmonary: Mon O2 Restrictions Weight Bearing Restrictions Per Provider Order: No     Mobility Bed Mobility Overal bed mobility: Independent                  Transfers Overall transfer level: Needs assistance Equipment used: None Transfers: Sit to/from Stand Sit to Stand: Supervision           General transfer comment: supervision for safety, poor activity tolerance      Balance Overall balance assessment: Mild deficits observed, not formally tested                                         ADL either performed or assessed with clinical judgement   ADL Overall ADL's : Needs assistance/impaired Eating/Feeding: Independent   Grooming: Supervision/safety;Sitting;Standing   Upper Body Bathing: Supervision/ safety;Sitting   Lower Body Bathing: Supervison/ safety;Sitting/lateral leans;With adaptive equipment   Upper Body Dressing : Supervision/safety;Sitting   Lower Body Dressing: Supervision/safety;Sitting/lateral leans   Toilet Transfer: Supervision/safety   Toileting- Clothing Manipulation and Hygiene: Supervision/safety         General ADL Comments: Pt overall doing well but limited due to significantly decreased activity tolerance. Pt has good strength/ROM, but  requires frequent rest breaks and O2 sats remain around 88-91% throughout session while sitting.     Vision Baseline Vision/History: 1 Wears glasses Ability to See in Adequate Light: 0 Adequate Patient Visual Report: No change from baseline       Perception         Praxis          Pertinent Vitals/Pain Pain Assessment Pain Assessment: Faces Faces Pain Scale: Hurts little more Pain Location: LBP Pain Descriptors / Indicators: Aching, Constant, Discomfort Pain Intervention(s): Monitored during session     Extremity/Trunk Assessment Upper Extremity Assessment Upper Extremity Assessment: Overall WFL for tasks assessed           Communication Communication Communication: No apparent difficulties   Cognition Arousal: Alert Behavior During Therapy: WFL for tasks assessed/performed Cognition: No apparent impairments                               Following commands: Intact       Cueing  General Comments   Cueing Techniques: Verbal cues  9L O2 via [place], saturation 88-91% throughout session while sitting and very brief walk   Exercises     Shoulder Instructions      Home Living Family/patient expects to be discharged to:: Private residence Living Arrangements: Non-relatives/Friends Available Help at Discharge: Available PRN/intermittently Type of Home: House Home Access: Level entry     Home Layout: Two level Alternate Level Stairs-Number of Steps: Split Level : enter on main level with kitchen/living  up 5 steps to bedroom and bathroom Alternate Level Stairs-Rails: Left Bathroom Shower/Tub: Chief Strategy Officer: Standard Bathroom Accessibility: Yes     Adaptive Equipment: Reacher;Long-handled sponge        Prior Functioning/Environment Prior Level of Function : Needs assist             Mobility Comments: Ambulates in home; gets SOB easily, seated rest breaks to recover; walks with AD; no falls in past 6 months. On 8L O2 at home. ADLs Comments: sits to shower, showering is very taxing; aide helps with dressing, pt showers independently with Aide nearby for safety. Housemate does havey housework and Aide does light housework. Aide does meal prep and laundry but cannot provide transportation.    OT Problem  List: Decreased activity tolerance;Pain;Obesity   OT Treatment/Interventions: Self-care/ADL training;Therapeutic exercise;Energy conservation;Therapeutic activities      OT Goals(Current goals can be found in the care plan section)   Acute Rehab OT Goals Patient Stated Goal: to improve activity tolerance, return home OT Goal Formulation: With patient Time For Goal Achievement: 12/21/23 Potential to Achieve Goals: Fair   OT Frequency:  Min 2X/week    Co-evaluation              AM-PAC OT 6 Clicks Daily Activity     Outcome Measure Help from another person eating meals?: None Help from another person taking care of personal grooming?: A Little Help from another person toileting, which includes using toliet, bedpan, or urinal?: A Little Help from another person bathing (including washing, rinsing, drying)?: A Little Help from another person to put on and taking off regular upper body clothing?: A Little Help from another person to put on and taking off regular lower body clothing?: A Little 6 Click Score: 19   End of Session Equipment Utilized During Treatment: Oxygen  Nurse Communication: Mobility status  Activity Tolerance: Patient limited by fatigue Patient left: in bed;with  call bell/phone within reach  OT Visit Diagnosis: Pain;Other (comment) (decreased activity tolerance) Pain - part of body:  (back)                Time: 8876-8856 OT Time Calculation (min): 20 min Charges:  OT General Charges $OT Visit: 1 Visit OT Evaluation $OT Eval Moderate Complexity: 1 [address] OTR/L   Elouise JONELLE Bott 12/07/2023, 11:48 AM

## 2023-12-07 NOTE — Discharge Summary (Signed)
 Physician Discharge Summary  Mario [name] FMW:[id number] DOB: 03-21-1950 DOA: 11/30/2023  PCP: [name]  Admit date: 11/30/2023 Discharge date: 12/07/2023 30 Day Unplanned Readmission Risk Score    Flowsheet Row ED to Hosp-Admission (Current) from 11/30/2023 in [facility]  30 Day Unplanned Readmission Risk Score (%) 49.64 Filed at 12/07/2023 1200    This score is the patient's risk of an unplanned readmission within 30 days of being discharged (0 -100%). The score is based on dignosis, age, lab data, medications, orders, and past utilization.   Low:  0-14.9   Medium: 15-21.9   High: 22-29.9   Extreme: 30 and above          Admitted From: Home Disposition: Home  Recommendations for Outpatient Follow-up:  Follow up with PCP in 1-2 weeks Please obtain BMP/CBC in one week Please follow up with your PCP on the following pending results: Unresulted Labs (From admission, onward)     Start     Ordered   12/07/23 0500  Creatinine, serum  (enoxaparin  (LOVENOX )    CrCl >/= 30 ml/min)  Weekly,   R     Comments: while on enoxaparin  therapy    11/30/23 0637              Home Health: Yes Equipment/Devices: Home oxygen   Discharge Condition: Stable CODE STATUS: DNR Diet recommendation:  Diet Order             Diet regular Fluid consistency: Thin  Diet effective now                   Subjective: Patient seen and examined, feeling much better.  Feels like he is back to baseline.  He is in agreement with going home today.  Brief/Interim Summary: Mario [name] is a 73 y.o. male with medical history significant of ankylosing spondylitis, chronic hypoxemic respiratory failure in the setting of COPD on 8 L of oxygen  via nasal cannulae at home, depression with prior history of suicide attempt, left lung cancer s/p radiation therapy, hypertension, OSA, obesity, type 2 diabetes, CKD stage III A presented with worsening  shortness of breath.   Patient had bronchoscopy with biopsy this past spring concerning for adenocarcinoma.  He was scheduled to follow-up locally with Dr. [name] but [facility] he did not show up to his appointment there and appears to have been lost to follow-up.   Patient was just seen in our facility and discharged on 9/30 for presumed COPD exacerbation, he appears to have been noncompliant with medications at home and reports back to our facility on 10/2 for worsening hypoxia from baseline.  He was hospitalized again,  following other details of hospitalization.   Acute on chronic hypoxemic respiratory failure in the setting of multifocal pneumonia / Rule out evolving lung cancer / Hemoptysis, transient -Patient recently admitted and discharged on 9/30 for COPD exacerbation. Markedly abnormal CTA concerning for multifocal pneumonia, upon further evaluation and discussion with pulmonology his left lung changes appear to be secondary to prior radiation from lung cancer and right sided infiltrate is concerning for possible new malignancy given duration as it was present on imaging earlier this year as well as previously last year. - Patient was to be evaluated locally by oncology for possible systemic treatment but declined and requested transfer of care and follow up at the [facility], it is unclear whether or not he underwent any  treatment or evaluation at their facility. **No show note on Jul 11, 2023 at the [place]- no notes after this date** - Given abnormal CTA here concerning for possible infiltrate will treat patient for multifocal pneumonia/HCAP He has now completed antibiotic course -Continues to require oxygen  above prior baseline (8L) - now on 9 L to keep sats >90 -Previous history of hemoptysis, continue to follow clinically -  - Patient to follow up outpatient with Dr [name] after discharge.   Presumed recurrent/progressive lung cancer, POA - See  above, oncology consulted for further insight and recommendations (patient previously refusing systemic treatment) - Unclear at this time whether or not patient has a clear avenue for treatment moving forward, will likely need to involve palliative care in the interim.  Per oncology, he will need to follow-up with them as outpatient.   Elevated troponin: - Likely type II, in the setting of hypoxia.   Prior history of left lung cancer - Status post radiation in the past.  Followed by pulmonology at [facility]   Ankylosing spondylitis - On Flexeril , gabapentin  and Norco-Will continue same   Depression with prior history of suicide attempt: - Continue bupropion , buspirone , duloxetine , oxcarbazepine  and trazodone    Hypertension: On diltiazem .  Will continue same   BPH: Continue Flomax    Type 2 diabetes: A1c 6.7.  Uncontrolled with hyperglycemia.  Resume PTA medications.   Stage III CKD: - Stable at baseline   Morbid obesity with BMI of 36 - Associated with hypertension, hyperlipidemia, weight loss and diet modification counseled.   Thrombocytosis: Likely reactive.  Will continue to monitor   Generalized weakness: Seen by PT OT, home health recommended.  This is arranged for him.  Discharge plan was discussed with patient and/or family member and they verbalized understanding and agreed with it.  Discharge Diagnoses:  Active Problems:   Goals of care, counseling/discussion   Multifocal pneumonia   Metastatic non-small cell lung cancer [facility])    Discharge Instructions   Allergies as of 12/07/2023       Reactions   Beet [beta Vulgaris] Nausea And Vomiting   Demerol [meperidine] Nausea And Vomiting, Other (See Comments)   Made the patient violently sick   Zocor [simvastatin] Nausea And Vomiting, Other (See Comments)   Made patient feel very jittery, also   Liver Nausea And Vomiting, Other (See Comments)   ANY Liver!!        Medication List     TAKE these medications     acetaminophen  500 MG tablet Commonly known as: TYLENOL  Take 1,000 mg by mouth every 6 (six) hours as needed for mild pain (pain score 1-3), moderate pain (pain score 4-6) or headache.   albuterol  (2.5 MG/3ML) 0.083% nebulizer solution Commonly known as: PROVENTIL  Take 2.5 mg by nebulization every 6 (six) hours as needed for wheezing or shortness of breath.   albuterol  108 (90 Base) MCG/ACT inhaler Commonly known as: VENTOLIN  HFA Inhale 2 puffs into the lungs every 4 (four) hours as needed for wheezing or shortness of breath.   aspirin  EC 81 MG tablet Take 81 mg by mouth daily. Swallow whole.   bisacodyl  5 MG EC tablet Commonly known as: DULCOLAX Take 2 tablets (10 mg total) by mouth daily as needed for moderate constipation. What changed: when to take this   BLACK CURRANT SEED OIL PO Take 2 capsules by mouth in the morning.   buPROPion  300 MG 24 hr tablet Commonly known as: WELLBUTRIN  XL Take 300 mg by mouth every morning.  busPIRone  15 MG tablet Commonly known as: BUSPAR  Take 15 mg by mouth in the morning.   CALCIUM  500 PO Take 500 mg by mouth in the morning.   Carboxymethylcellulose Sod PF 0.5 % Soln Place 1 drop into both eyes 4 (four) times daily as needed (dry eyes).   cholecalciferol  25 MCG (1000 UNIT) tablet Commonly known as: VITAMIN D3 Take 1,000 Units by mouth daily.   clobetasol  ointment 0.05 % Commonly known as: TEMOVATE  Apply 1 Application topically 2 (two) times daily. For ankles and feet   cyclobenzaprine  10 MG tablet Commonly known as: FLEXERIL  Take 20 mg by mouth at bedtime.   cycloSPORINE  0.05 % ophthalmic emulsion Commonly known as: RESTASIS  Place 1 drop into both eyes every 12 (twelve) hours.   diltiazem  120 MG 24 hr capsule Commonly known as: CARDIZEM  CD Take 1 capsule (120 mg total) by mouth daily.   docusate sodium  100 MG capsule Commonly known as: COLACE Take 100-200 mg by mouth 2 (two) times daily. Take two in am. Take one in  pm.   DULoxetine  60 MG capsule Commonly known as: CYMBALTA  Take 60 mg by mouth 2 (two) times daily.   ferrous sulfate  325 (65 FE) MG tablet Take 325 mg by mouth daily with breakfast.   fluticasone  50 MCG/ACT nasal spray Commonly known as: FLONASE  Place 2 sprays into both nostrils in the morning.   gabapentin  100 MG capsule Commonly known as: NEURONTIN  Take 100 mg by mouth 3 (three) times daily.   GARLIC OIL PO Take 1 capsule by mouth in the morning.   Glucerna Liqd Take 237 mLs by mouth daily at 12 noon. Glucerna Therapeutic Shake chocolate- Drink 1 bottle by mouth once a day   guaiFENesin  600 MG 12 hr tablet Commonly known as: MUCINEX  Take 1 tablet (600 mg total) by mouth 2 (two) times daily as needed for cough or to loosen phlegm.   HYDROcodone -acetaminophen  10-325 MG tablet Commonly known as: NORCO Take 1 tablet by mouth every 6 (six) hours as needed for moderate pain (pain score 4-6).   KRILL OIL PO Take 2 capsules by mouth daily.   lactulose  10 GM/15ML solution Commonly known as: CHRONULAC  Take 20 g by mouth 2 (two) times daily as needed for mild constipation.   lidocaine  5 % ointment Commonly known as: XYLOCAINE  Apply 1 Application topically 2 (two) times daily as needed (for pain- affected sites).   MAG-OXIDE PO Take 1 tablet by mouth daily.   magnesium  citrate Soln Take 0.5 Bottles by mouth daily as needed for severe constipation.   Melatonin 3 MG Caps Take 6 mg by mouth at bedtime.   multivitamin tablet Take 1 tablet by mouth daily with breakfast.   omeprazole  20 MG capsule Commonly known as: PRILOSEC Take 1 capsule (20 mg total) by mouth daily.   Oxcarbazepine  300 MG tablet Commonly known as: TRILEPTAL  Take 900 mg by mouth 2 (two) times daily.   OXYGEN  Inhale 6 L/min into the lungs continuous.   polyethylene glycol powder 17 GM/SCOOP powder Commonly known as: GLYCOLAX /MIRALAX  Take 17 g by mouth in the morning.   predniSONE  10 MG  tablet Commonly known as: DELTASONE  Take 3 tablets (30 mg total) by mouth daily for 3 days, THEN 2 tablets (20 mg total) daily for 3 days, THEN 1 tablet (10 mg total) daily for 3 days. Start taking on: December 07, 2023 What changed:  medication strength See the new instructions.   senna 8.6 MG Tabs tablet Commonly known as: SENOKOT  Take 2 tablets (17.2 mg total) by mouth 2 (two) times daily. What changed:  how much to take when to take this   tamsulosin  0.4 MG Caps capsule Commonly known as: FLOMAX  Take 0.8 mg by mouth in the morning.   Tiotropium Bromide -Olodaterol 2.5-2.5 MCG/ACT Aers Inhale 2 puffs into the lungs every morning.   traZODone  50 MG tablet Commonly known as: DESYREL  Take 75 mg by mouth at bedtime.   Turmeric Curcumin Caps Take 3 capsules by mouth See admin instructions. Turmeric/Bioperine/Ginger/Garlic- Take 3 capsules by mouth once a day   VITAMIN B12 PO Take 1 tablet by mouth in the morning.        Follow-up Information     [facility] Follow [facility].   Specialty: Home Health Services Why: Please continue Home Health PT/OT/RN services upon discharge. Contact information: [address] [place] [address] [phone]         [facility] in 1 week(s).   Contact information: [address] [facility] [place] [address] [phone]                Allergies  Allergen Reactions   Beet [Beta Vulgaris] Nausea And Vomiting   Demerol [Meperidine] Nausea And Vomiting and Other (See Comments)    Made the patient violently sick   Zocor [Simvastatin] Nausea And Vomiting and Other (See Comments)    Made patient feel very jittery, also   Liver Nausea And Vomiting and Other (See Comments)    ANY Liver!!    Consultations: Oncology   Procedures/Studies: CT Angio Chest PE W/Cm &/Or Wo Cm Result Date: 11/30/2023 EXAM: CTA CHEST 11/30/2023 05:22:30 AM TECHNIQUE: CTA of the chest was  performed without and with the administration of 100 mL iohexol  (OMNIPAQUE ) 350 MG/ML injection. Multiplanar reformatted images are provided for review. MIP images are provided for review. Automated exposure control, iterative reconstruction, and/or weight based adjustment of the mA/kV was utilized to reduce the radiation dose to as low as reasonably achievable. COMPARISON: CT angiogram of the chest dated 07/20/2023. CLINICAL HISTORY: Pulmonary embolism (PE) suspected, high prob. Increased SOB, recent admission for COPD exacerbation, wbc's 16.5, GFR>60, hx of lung ca 09/2020, radiation to left lung; Port cxr prior to ct; 100 omni 350. FINDINGS: PULMONARY ARTERIES: Pulmonary arteries are adequately opacified for evaluation. No acute pulmonary embolus. Main pulmonary artery is normal in caliber. MEDIASTINUM: There is moderate calcific coronary artery disease. The pericardium demonstrates no acute abnormality. The thoracic aorta also demonstrates moderate calcific atheromatous disease. LYMPH NODES: There are numerous shotty mediastinal nodes, as before. No hilar or axillary lymphadenopathy. LUNGS AND PLEURA: There is moderate-to-severe central lobular emphysema present. There is extensive patchy opacification/consolidation of the lower lobes bilaterally and of the lingula. There are also patchy neuro bronchovascular nodular opacities within the right middle lobe. No evidence of pleural effusion or pneumothorax. UPPER ABDOMEN: Simple-appearing exophytic cysts arising from the kidneys bilaterally. The abdominal aorta demonstrates moderate calcific atheromatous disease. SOFT TISSUES AND BONES: No acute bone or soft tissue abnormality. IMPRESSION: 1. No evidence of pulmonary embolism. 2. Extensive multifocal pneumonia involving the lower lobes bilaterally, lingula, and right middle lobe. 3. Moderate-to-severe centrilobular emphysema. Electronically signed by: Evalene Coho MD 11/30/2023 05:50 AM EDT RP Workstation:  [id number]   DG Chest Port 1 View Result Date: 11/30/2023 EXAM: 1 VIEW(S) XRAY OF THE CHEST 11/30/2023 03:07:00 AM COMPARISON: 11/23/2023 CLINICAL HISTORY: sob. Shortness of breath FINDINGS: LUNGS AND PLEURA: Patchy airspace opacities throughout left lung. Right  lower lung zone airspace opacity. Small bilateral pleural effusions. No pulmonary edema. No pneumothorax. HEART AND MEDIASTINUM: No acute abnormality of the cardiac and mediastinal silhouettes. BONES AND SOFT TISSUES: No acute osseous abnormality. IMPRESSION: 1. Patchy airspace opacities throughout the left lung and in the right lower lung similar to 11/23/2023. 2. Small bilateral pleural effusions. Electronically signed by: Norman Gatlin MD 11/30/2023 03:19 AM EDT RP Workstation: [id number]   DG Chest Port 1 View Result Date: 11/23/2023 EXAM: 1 VIEW(S) XRAY OF THE CHEST 11/23/2023 11:24:06 PM COMPARISON: CTA chest dated 07/20/2023. CLINICAL HISTORY: shortness of breath. Per chart - c/o SOB for the past 3-4 days. Per EMS, pts initial oxygen  saturation was 88% on his baseline 8L [place]. Pt was given one Duoneb treatment with some relief. On arrival, labored breathing noted with retractions and pt is unable to speak in full ; sentences with out taking a breath. Pt denies any recent sick contacts, chest pain, fevers, chills, n/v/d. Dr. Cottie at bedside and respiratory notified FINDINGS: LUNGS AND PLEURA: Stable multifocal opacities are present in the left mid/lower lung and right lower lung. While this has previously been considered suspicious for pneumonia, given the chronic appearance, multifocal semi-invasive peripheral adenocarcinoma (formally, bronchoalveolar cell carcinoma) should be considered. Associated small bilateral pleural effusions, chronic. No pulmonary edema. No pneumothorax. HEART AND MEDIASTINUM: No acute abnormality of the cardiac and mediastinal silhouettes. BONES AND SOFT TISSUES: No acute osseous abnormality. IMPRESSION: 1. Stable  multifocal opacities in the left mid/lower lung and right lower lung. Given chronic appearance, multifocal semi-invasive peripheral adenocarcinoma should be considered. 2. Small bilateral pleural effusions, chronic. Electronically signed by: Pinkie Pebbles MD 11/23/2023 11:29 PM EDT RP Workstation: [id number]     Discharge Exam: Vitals:   12/07/23 0736 12/07/23 1149  BP:  (!) 142/73  Pulse:  (!) 110  Resp:  19  Temp:  98 F (36.7 C)  SpO2: 95% 93%   Vitals:   12/06/23 2346 12/07/23 0420 12/07/23 0736 12/07/23 1149  BP: 119/67 134/73  (!) 142/73  Pulse: 86 84  (!) 110  Resp: 19 19  19   Temp: 98 F (36.7 C) 98 F (36.7 C)  98 F (36.7 C)  TempSrc:      SpO2: (!) 89% 93% 95% 93%  Weight:      Height:        General: Pt is alert, awake, not in acute distress Cardiovascular: RRR, S1/S2 +, no rubs, no gallops Respiratory: CTA bilaterally, no wheezing, no rhonchi Abdominal: Soft, NT, ND, bowel sounds + Extremities: no edema, no cyanosis    The results of significant diagnostics from this hospitalization (including imaging, microbiology, ancillary and laboratory) are listed below for reference.     Microbiology: Recent Results (from the past 240 hours)  MRSA Next Gen by PCR, Nasal     Status: Abnormal   Collection Time: 11/30/23  6:53 AM   Specimen: Nasal Mucosa; Nasal Swab  Result Value Ref Range Status   MRSA by PCR Next Gen DETECTED (A) NOT DETECTED Final    Comment: RESULT CALLED TO, READ BACK BY AND VERIFIED WITH:  [place], D 11/30/23 1126 AJ (NOTE) The GeneXpert MRSA Assay (FDA approved for NASAL specimens only), is one component of a comprehensive MRSA colonization surveillance program. It is not intended to diagnose MRSA infection nor to guide or monitor treatment for MRSA infections. Test performance is not FDA approved in patients less than 8 years old. Performed at [facility], [address] [place],  [place] [address]   Resp  panel by RT-PCR (RSV, Flu A&B, Covid) Anterior Nasal Swab     Status: None   Collection Time: 11/30/23  9:00 AM   Specimen: Anterior Nasal Swab  Result Value Ref Range Status   SARS Coronavirus 2 by RT PCR NEGATIVE NEGATIVE Final    Comment: (NOTE) SARS-CoV-2 target nucleic acids are NOT DETECTED.  The SARS-CoV-2 RNA is generally detectable in upper respiratory specimens during the acute phase of infection. The lowest concentration of SARS-CoV-2 viral copies this assay can detect is 138 copies/mL. A negative result does not preclude SARS-Cov-2 infection and should not be used as the sole basis for treatment or other patient management decisions. A negative result may occur with  improper specimen collection/handling, submission of specimen other than nasopharyngeal swab, presence of viral mutation(s) within the areas targeted by this assay, and inadequate number of viral copies(<138 copies/mL). A negative result must be combined with clinical observations, patient history, and epidemiological information. The expected result is Negative.  Fact Sheet for Patients:  BloggerCourse.com  Fact Sheet for Healthcare Providers:  SeriousBroker.it  This test is no t yet approved or cleared by the [place]  FDA and  has been authorized for detection and/or diagnosis of SARS-CoV-2 by FDA under an Emergency Use Authorization (EUA). This EUA will remain  in effect (meaning this test can be used) for the duration of the COVID-19 declaration under Section 564(b)(1) of the Act, 21 U.S.C.section 360bbb-3(b)(1), unless the authorization is terminated  or revoked sooner.       Influenza A by PCR NEGATIVE NEGATIVE Final   Influenza B by PCR NEGATIVE NEGATIVE Final    Comment: (NOTE) The Xpert Xpress SARS-CoV-2/FLU/RSV plus assay is intended as an aid in the diagnosis of influenza from Nasopharyngeal swab specimens and should not be used as a  sole basis for treatment. Nasal washings and aspirates are unacceptable for Xpert Xpress SARS-CoV-2/FLU/RSV testing.  Fact Sheet for Patients: BloggerCourse.com  Fact Sheet for Healthcare Providers: SeriousBroker.it  This test is not yet approved or cleared by the [place]  FDA and has been authorized for detection and/or diagnosis of SARS-CoV-2 by FDA under an Emergency Use Authorization (EUA). This EUA will remain in effect (meaning this test can be used) for the duration of the COVID-19 declaration under Section 564(b)(1) of the Act, 21 U.S.C. section 360bbb-3(b)(1), unless the authorization is terminated or revoked.     Resp Syncytial Virus by PCR NEGATIVE NEGATIVE Final    Comment: (NOTE) Fact Sheet for Patients: BloggerCourse.com  Fact Sheet for Healthcare Providers: SeriousBroker.it  This test is not yet approved or cleared by the [place]  FDA and has been authorized for detection and/or diagnosis of SARS-CoV-2 by FDA under an Emergency Use Authorization (EUA). This EUA will remain in effect (meaning this test can be used) for the duration of the COVID-19 declaration under Section 564(b)(1) of the Act, 21 U.S.C. section 360bbb-3(b)(1), unless the authorization is terminated or revoked.  Performed at [facility], [address] [place], [place] [address]   Culture, blood (routine x 2) Call MD if unable to obtain prior to antibiotics being given     Status: None   Collection Time: 11/30/23  5:37 PM   Specimen: BLOOD RIGHT HAND  Result Value Ref Range Status   Specimen Description   Final    BLOOD RIGHT HAND Performed at [facility], [address], [place], [place] [address]    Special Requests  Final    BOTTLES DRAWN AEROBIC AND ANAEROBIC Blood Culture adequate volume Performed at [facility], [address], [place], [place] [address]    Culture   Final    NO GROWTH 5 DAYS Performed at [facility], [address] [place], [place] [address]    Report Status 12/05/2023 FINAL  Final  Culture, blood (routine x 2) Call MD if unable to obtain prior to antibiotics being given     Status: None   Collection Time: 11/30/23  5:41 PM   Specimen: BLOOD LEFT HAND  Result Value Ref Range Status   Specimen Description   Final    BLOOD LEFT HAND Performed at [facility], [address] [place], [place] [address]    Special Requests   Final    BOTTLES DRAWN AEROBIC AND ANAEROBIC Blood Culture adequate volume Performed at [facility], [address], [place], [place] [address]    Culture   Final    NO GROWTH 5 DAYS Performed at [facility], [address], [place], [place] [address]    Report Status 12/05/2023 FINAL  Final     Labs: BNP (last 3 results) Recent Labs    05/16/23 1529 05/17/23 2012 07/19/23 1927  BNP 41.5 46.5 25.4   Basic Metabolic Panel: Recent Labs  Lab 12/01/23 0525 12/02/23 0621 12/03/23 0533 12/04/23 0522 12/07/23 0538 12/07/23 0828  NA 139 135 136 134*  --  136  K 4.2 4.3 3.9 3.7  --  4.8  CL 102 100 100 101  --  97*  CO2 26 25 26 24   --  32  GLUCOSE 114* 90 118* 113*  --  106*  BUN 21 17 15 16   --  15  CREATININE 0.98 0.84 0.85 0.85 0.94 0.94  CALCIUM  9.1 8.9 8.8* 8.9  --  9.3   Liver Function Tests: No results for input(s): AST, ALT, ALKPHOS, BILITOT, PROT, ALBUMIN in the last 168 hours. No results for input(s): LIPASE, AMYLASE in the last 168 hours. No results for input(s): AMMONIA in the last 168 hours. CBC: Recent Labs  Lab 12/01/23 0525 12/02/23 0859 12/03/23 0533 12/04/23 0522  WBC 17.5* 12.5* 12.4* 13.4*  HGB 13.7 13.4 12.7* 13.9  HCT 44.7 42.6 41.3 43.6  MCV 93.1 93.4 92.0 90.8  PLT 351 336 320 322   Cardiac Enzymes: No results for input(s): CKTOTAL, CKMB, CKMBINDEX,  TROPONINI in the last 168 hours. BNP: Invalid input(s): POCBNP CBG: Recent Labs  Lab 12/06/23 1130 12/06/23 1638 12/06/23 2022 12/07/23 0753 12/07/23 1147  GLUCAP 193* 169* 221* 110* 177*   D-Dimer No results for input(s): DDIMER in the last 72 hours. Hgb A1c No results for input(s): HGBA1C in the last 72 hours. Lipid Profile No results for input(s): CHOL, HDL, LDLCALC, TRIG, CHOLHDL, LDLDIRECT in the last 72 hours. Thyroid  function studies No results for input(s): TSH, T4TOTAL, T3FREE, THYROIDAB in the last 72 hours.  Invalid input(s): FREET3 Anemia work up No results for input(s): VITAMINB12, FOLATE, FERRITIN, TIBC, IRON, RETICCTPCT in the last 72 hours. Urinalysis    Component Value Date/Time   COLORURINE YELLOW 09/25/2022 1030   APPEARANCEUR CLEAR 09/25/2022 1030   LABSPEC 1.023 09/25/2022 1030   PHURINE 6.0 09/25/2022 1030   GLUCOSEU NEGATIVE 09/25/2022 1030   HGBUR NEGATIVE 09/25/2022 1030   BILIRUBINUR NEGATIVE 09/25/2022 1030   KETONESUR NEGATIVE 09/25/2022 1030   PROTEINUR NEGATIVE 09/25/2022 1030   UROBILINOGEN 1.0 01/12/2013 0024  NITRITE NEGATIVE 09/25/2022 1030   LEUKOCYTESUR NEGATIVE 09/25/2022 1030   Sepsis Labs Recent Labs  Lab 12/01/23 0525 12/02/23 0859 12/03/23 0533 12/04/23 0522  WBC 17.5* 12.5* 12.4* 13.4*   Microbiology Recent Results (from the past 240 hours)  MRSA Next Gen by PCR, Nasal     Status: Abnormal   Collection Time: 11/30/23  6:53 AM   Specimen: Nasal Mucosa; Nasal Swab  Result Value Ref Range Status   MRSA by PCR Next Gen DETECTED (A) NOT DETECTED Final    Comment: RESULT CALLED TO, READ BACK BY AND VERIFIED WITH:  [place], D 11/30/23 1126 AJ (NOTE) The GeneXpert MRSA Assay (FDA approved for NASAL specimens only), is one component of a comprehensive MRSA colonization surveillance program. It is not intended to diagnose MRSA infection nor to guide or monitor treatment for  MRSA infections. Test performance is not FDA approved in patients less than 17 years old. Performed at [facility], [address] [place], [place] [address]   Resp panel by RT-PCR (RSV, Flu A&B, Covid) Anterior Nasal Swab     Status: None   Collection Time: 11/30/23  9:00 AM   Specimen: Anterior Nasal Swab  Result Value Ref Range Status   SARS Coronavirus 2 by RT PCR NEGATIVE NEGATIVE Final    Comment: (NOTE) SARS-CoV-2 target nucleic acids are NOT DETECTED.  The SARS-CoV-2 RNA is generally detectable in upper respiratory specimens during the acute phase of infection. The lowest concentration of SARS-CoV-2 viral copies this assay can detect is 138 copies/mL. A negative result does not preclude SARS-Cov-2 infection and should not be used as the sole basis for treatment or other patient management decisions. A negative result may occur with  improper specimen collection/handling, submission of specimen other than nasopharyngeal swab, presence of viral mutation(s) within the areas targeted by this assay, and inadequate number of viral copies(<138 copies/mL). A negative result must be combined with clinical observations, patient history, and epidemiological information. The expected result is Negative.  Fact Sheet for Patients:  BloggerCourse.com  Fact Sheet for Healthcare Providers:  SeriousBroker.it  This test is no t yet approved or cleared by the [place]  FDA and  has been authorized for detection and/or diagnosis of SARS-CoV-2 by FDA under an Emergency Use Authorization (EUA). This EUA will remain  in effect (meaning this test can be used) for the duration of the COVID-19 declaration under Section 564(b)(1) of the Act, 21 U.S.C.section 360bbb-3(b)(1), unless the authorization is terminated  or revoked sooner.       Influenza A by PCR NEGATIVE NEGATIVE Final   Influenza B by PCR NEGATIVE NEGATIVE  Final    Comment: (NOTE) The Xpert Xpress SARS-CoV-2/FLU/RSV plus assay is intended as an aid in the diagnosis of influenza from Nasopharyngeal swab specimens and should not be used as a sole basis for treatment. Nasal washings and aspirates are unacceptable for Xpert Xpress SARS-CoV-2/FLU/RSV testing.  Fact Sheet for Patients: BloggerCourse.com  Fact Sheet for Healthcare Providers: SeriousBroker.it  This test is not yet approved or cleared by the [place]  FDA and has been authorized for detection and/or diagnosis of SARS-CoV-2 by FDA under an Emergency Use Authorization (EUA). This EUA will remain in effect (meaning this test can be used) for the duration of the COVID-19 declaration under Section 564(b)(1) of the Act, 21 U.S.C. section 360bbb-3(b)(1), unless the authorization is terminated or revoked.     Resp Syncytial Virus by PCR NEGATIVE NEGATIVE Final    Comment: (NOTE) Fact Sheet  for Patients: BloggerCourse.com  Fact Sheet for Healthcare Providers: SeriousBroker.it  This test is not yet approved or cleared by the [place]  FDA and has been authorized for detection and/or diagnosis of SARS-CoV-2 by FDA under an Emergency Use Authorization (EUA). This EUA will remain in effect (meaning this test can be used) for the duration of the COVID-19 declaration under Section 564(b)(1) of the Act, 21 U.S.C. section 360bbb-3(b)(1), unless the authorization is terminated or revoked.  Performed at [facility], [address], [place], [place] [address]   Culture, blood (routine x 2) Call MD if unable to obtain prior to antibiotics being given     Status: None   Collection Time: 11/30/23  5:37 PM   Specimen: BLOOD RIGHT HAND  Result Value Ref Range Status   Specimen Description   Final    BLOOD RIGHT HAND Performed at [facility], [address] [place], [place] [address]    Special Requests   Final    BOTTLES DRAWN AEROBIC AND ANAEROBIC Blood Culture adequate volume Performed at [facility], [address] [place], [place] [address]    Culture   Final    NO GROWTH 5 DAYS Performed at [facility], [address], [place], [place] [address]    Report Status 12/05/2023 FINAL  Final  Culture, blood (routine x 2) Call MD if unable to obtain prior to antibiotics being given     Status: None   Collection Time: 11/30/23  5:41 PM   Specimen: BLOOD LEFT HAND  Result Value Ref Range Status   Specimen Description   Final    BLOOD LEFT HAND Performed at [facility], [address] [place], [place] [address]    Special Requests   Final    BOTTLES DRAWN AEROBIC AND ANAEROBIC Blood Culture adequate volume Performed at [facility], [address] [place], [place] [address]    Culture   Final    NO GROWTH 5 DAYS Performed at [facility], [address] [place], [place] [address]    Report Status 12/05/2023 FINAL  Final    FURTHER DISCHARGE INSTRUCTIONS:   Get Medicines reviewed and adjusted: Please take all your medications with you for your next visit with your Primary MD   Laboratory/radiological data: Please request your Primary MD to go over all hospital tests and procedure/radiological results at the follow up, please ask your Primary MD to get all Hospital records sent to his/her office.   In some cases, they will be blood work, cultures and biopsy results pending at the time of your discharge. Please request that your primary care M.D. goes through all the records of your hospital data and follows up on these results.   Also Note the following: If you experience worsening of your admission symptoms, develop shortness of breath, life threatening emergency, suicidal or homicidal thoughts you must seek medical attention immediately by calling 911 or calling your  MD immediately  if symptoms less severe.   You must read complete instructions/literature along with all the possible adverse reactions/side effects for all the Medicines you take and that have been prescribed to you. Take any new Medicines after you have completely understood and accpet all the possible adverse reactions/side effects.    patient was instructed, not to drive, operate heavy machinery, perform activities at heights, swimming or participation in water activities or provide baby-sitting services while on Pain, Sleep and Anxiety Medications;  until their outpatient Physician has advised to do so again. Also recommended to not to take more than prescribed Pain, Sleep and Anxiety Medications.  It is not advisable to combine anxiety, sleep and pain medications without talking with your primary care provider.     Wear Seat belts while driving.   Please note: You were cared for by a hospitalist during your hospital stay. Once you are discharged, your primary care physician will handle any further medical issues. Please note that NO REFILLS for any discharge medications will be authorized once you are discharged, as it is imperative that you return to your primary care physician (or establish a relationship with a primary care physician if you do not have one) for your post hospital discharge needs so that they can reassess your need for medications and monitor your lab values  Time coordinating discharge: Over 30 minutes  SIGNED:   Fredia Skeeter, MD  [facility] 12/07/2023, 12:29 PM *Please note that this is a verbal dictation therefore any spelling or grammatical errors are due to the Dragon Medical One system interpretation. If 7PM-7AM, please contact night-coverage [email or web address]

## 2023-12-08 ENCOUNTER — Other Ambulatory Visit: Payer: Self-pay

## 2023-12-08 ENCOUNTER — Inpatient Hospital Stay (HOSPITAL_COMMUNITY)
Admission: EM | Admit: 2023-12-08 | Discharge: 2023-12-09 | DRG: 191 | Disposition: A | Discharge status: Home / Self Care | Attending: Family Medicine | Admitting: Family Medicine

## 2023-12-08 ENCOUNTER — Encounter (HOSPITAL_COMMUNITY): Payer: Self-pay | Admitting: Emergency Medicine

## 2023-12-08 ENCOUNTER — Emergency Department (HOSPITAL_COMMUNITY)

## 2023-12-08 DIAGNOSIS — N400 Enlarged prostate without lower urinary tract symptoms: Secondary | ICD-10-CM | POA: Diagnosis present

## 2023-12-08 DIAGNOSIS — J439 Emphysema, unspecified: Principal | ICD-10-CM | POA: Diagnosis present

## 2023-12-08 DIAGNOSIS — Z7982 Long term (current) use of aspirin: Secondary | ICD-10-CM

## 2023-12-08 DIAGNOSIS — J188 Other pneumonia, unspecified organism: Secondary | ICD-10-CM | POA: Diagnosis present

## 2023-12-08 DIAGNOSIS — Z6836 Body mass index (BMI) 36.0-36.9, adult: Secondary | ICD-10-CM | POA: Diagnosis not present

## 2023-12-08 DIAGNOSIS — J189 Pneumonia, unspecified organism: Secondary | ICD-10-CM

## 2023-12-08 DIAGNOSIS — Z66 Do not resuscitate: Secondary | ICD-10-CM | POA: Diagnosis present

## 2023-12-08 DIAGNOSIS — Z923 Personal history of irradiation: Secondary | ICD-10-CM | POA: Diagnosis not present

## 2023-12-08 DIAGNOSIS — Z86711 Personal history of pulmonary embolism: Secondary | ICD-10-CM | POA: Diagnosis not present

## 2023-12-08 DIAGNOSIS — K219 Gastro-esophageal reflux disease without esophagitis: Secondary | ICD-10-CM | POA: Diagnosis present

## 2023-12-08 DIAGNOSIS — Z85828 Personal history of other malignant neoplasm of skin: Secondary | ICD-10-CM | POA: Diagnosis not present

## 2023-12-08 DIAGNOSIS — I252 Old myocardial infarction: Secondary | ICD-10-CM

## 2023-12-08 DIAGNOSIS — R7989 Other specified abnormal findings of blood chemistry: Secondary | ICD-10-CM | POA: Diagnosis present

## 2023-12-08 DIAGNOSIS — C3432 Malignant neoplasm of lower lobe, left bronchus or lung: Secondary | ICD-10-CM | POA: Diagnosis present

## 2023-12-08 DIAGNOSIS — N1831 Chronic kidney disease, stage 3a: Secondary | ICD-10-CM | POA: Diagnosis present

## 2023-12-08 DIAGNOSIS — J910 Malignant pleural effusion: Secondary | ICD-10-CM | POA: Diagnosis present

## 2023-12-08 DIAGNOSIS — J449 Chronic obstructive pulmonary disease, unspecified: Secondary | ICD-10-CM | POA: Diagnosis not present

## 2023-12-08 DIAGNOSIS — E1122 Type 2 diabetes mellitus with diabetic chronic kidney disease: Principal | ICD-10-CM | POA: Diagnosis present

## 2023-12-08 DIAGNOSIS — Z7952 Long term (current) use of systemic steroids: Secondary | ICD-10-CM | POA: Diagnosis not present

## 2023-12-08 DIAGNOSIS — I700 Atherosclerosis of aorta: Secondary | ICD-10-CM | POA: Diagnosis present

## 2023-12-08 DIAGNOSIS — Z87891 Personal history of nicotine dependence: Secondary | ICD-10-CM | POA: Diagnosis not present

## 2023-12-08 DIAGNOSIS — E66812 Obesity, class 2: Secondary | ICD-10-CM | POA: Diagnosis present

## 2023-12-08 DIAGNOSIS — F319 Bipolar disorder, unspecified: Secondary | ICD-10-CM | POA: Diagnosis present

## 2023-12-08 DIAGNOSIS — R0600 Dyspnea, unspecified: Principal | ICD-10-CM

## 2023-12-08 DIAGNOSIS — Z79899 Other long term (current) drug therapy: Secondary | ICD-10-CM

## 2023-12-08 DIAGNOSIS — I129 Hypertensive chronic kidney disease with stage 1 through stage 4 chronic kidney disease, or unspecified chronic kidney disease: Secondary | ICD-10-CM | POA: Diagnosis present

## 2023-12-08 DIAGNOSIS — C7800 Secondary malignant neoplasm of unspecified lung: Secondary | ICD-10-CM | POA: Diagnosis not present

## 2023-12-08 DIAGNOSIS — E1140 Type 2 diabetes mellitus with diabetic neuropathy, unspecified: Secondary | ICD-10-CM | POA: Diagnosis present

## 2023-12-08 DIAGNOSIS — G4733 Obstructive sleep apnea (adult) (pediatric): Secondary | ICD-10-CM | POA: Diagnosis present

## 2023-12-08 DIAGNOSIS — J9611 Chronic respiratory failure with hypoxia: Secondary | ICD-10-CM | POA: Diagnosis not present

## 2023-12-08 LAB — CBC WITH DIFFERENTIAL/PLATELET
Abs Immature Granulocytes: 0.12 K/uL — ABNORMAL HIGH (ref 0.00–0.07)
Basophils Absolute: 0.1 K/uL (ref 0.0–0.1)
Basophils Relative: 0 %
Eosinophils Absolute: 0.3 K/uL (ref 0.0–0.5)
Eosinophils Relative: 2 %
HCT: 46.3 % (ref 39.0–52.0)
Hemoglobin: 14.6 g/dL (ref 13.0–17.0)
Immature Granulocytes: 1 %
Lymphocytes Relative: 16 %
Lymphs Abs: 2.8 K/uL (ref 0.7–4.0)
MCH: 28.8 pg (ref 26.0–34.0)
MCHC: 31.5 g/dL (ref 30.0–36.0)
MCV: 91.3 fL (ref 80.0–100.0)
Monocytes Absolute: 1.5 K/uL — ABNORMAL HIGH (ref 0.1–1.0)
Monocytes Relative: 9 %
Neutro Abs: 13 K/uL — ABNORMAL HIGH (ref 1.7–7.7)
Neutrophils Relative %: 72 %
Platelets: 370 K/uL (ref 150–400)
RBC: 5.07 MIL/uL (ref 4.22–5.81)
RDW: 14.8 % (ref 11.5–15.5)
WBC: 17.8 K/uL — ABNORMAL HIGH (ref 4.0–10.5)
nRBC: 0 % (ref 0.0–0.2)

## 2023-12-08 LAB — RESP PANEL BY RT-PCR (RSV, FLU A&B, COVID)  RVPGX2
Influenza A by PCR: NEGATIVE
Influenza B by PCR: NEGATIVE
Resp Syncytial Virus by PCR: NEGATIVE
SARS Coronavirus 2 by RT PCR: NEGATIVE

## 2023-12-08 LAB — COMPREHENSIVE METABOLIC PANEL WITH GFR
ALT: 22 U/L (ref 0–44)
AST: 20 U/L (ref 15–41)
Albumin: 4.2 g/dL (ref 3.5–5.0)
Alkaline Phosphatase: 88 U/L (ref 38–126)
Anion gap: 11 (ref 5–15)
BUN: 19 mg/dL (ref 8–23)
CO2: 27 mmol/L (ref 22–32)
Calcium: 9.8 mg/dL (ref 8.9–10.3)
Chloride: 97 mmol/L — ABNORMAL LOW (ref 98–111)
Creatinine, Ser: 0.99 mg/dL (ref 0.61–1.24)
GFR, Estimated: >60 mL/min (ref >60)
Glucose, Bld: 130 mg/dL — ABNORMAL HIGH (ref 70–99)
Potassium: 4.3 mmol/L (ref 3.5–5.1)
Sodium: 135 mmol/L (ref 135–145)
Total Bilirubin: 0.2 mg/dL (ref 0.0–1.2)
Total Protein: 7.4 g/dL (ref 6.5–8.1)

## 2023-12-08 LAB — URINALYSIS, W/ REFLEX TO CULTURE (INFECTION SUSPECTED)
Bacteria, UA: NONE SEEN
Bilirubin Urine: NEGATIVE
Glucose, UA: NEGATIVE mg/dL
Hgb urine dipstick: NEGATIVE
Ketones, ur: NEGATIVE mg/dL
Leukocytes,Ua: NEGATIVE
Nitrite: NEGATIVE
Protein, ur: NEGATIVE mg/dL
Specific Gravity, Urine: >1.046 — ABNORMAL HIGH (ref 1.005–1.030)
pH: 6 (ref 5.0–8.0)

## 2023-12-08 LAB — CBG MONITORING, ED
Glucose-Capillary: 141 mg/dL — ABNORMAL HIGH (ref 70–99)
Glucose-Capillary: 182 mg/dL — ABNORMAL HIGH (ref 70–99)

## 2023-12-08 LAB — TROPONIN T, HIGH SENSITIVITY
Troponin T High Sensitivity: 22 ng/L — ABNORMAL HIGH (ref 0–19)
Troponin T High Sensitivity: 21 ng/L — ABNORMAL HIGH (ref 0–19)

## 2023-12-08 LAB — GLUCOSE, CAPILLARY: Glucose-Capillary: 129 mg/dL — ABNORMAL HIGH (ref 70–99)

## 2023-12-08 MED ORDER — BUSPIRONE HCL 5 MG PO TABS
15.0000 mg | ORAL_TABLET | Freq: Every morning | ORAL | Status: DC
  Administered 2023-12-08 – 2023-12-09 (×2): 15 mg via ORAL
  Filled 2023-12-08: qty 1
  Filled 2023-12-08: qty 2

## 2023-12-08 MED ORDER — ENOXAPARIN SODIUM 40 MG/0.4ML IJ SOSY
40.0000 mg | PREFILLED_SYRINGE | INTRAMUSCULAR | Status: DC
Start: 2023-12-08 — End: 2023-12-09
  Administered 2023-12-08 – 2023-12-09 (×2): 40 mg via SUBCUTANEOUS
  Filled 2023-12-08 (×2): qty 0.4

## 2023-12-08 MED ORDER — SODIUM CHLORIDE 0.9 % IV SOLN
2.0000 g | Freq: Three times a day (TID) | INTRAVENOUS | Status: DC
  Administered 2023-12-08 – 2023-12-09 (×4): 2 g via INTRAVENOUS
  Filled 2023-12-08 (×4): qty 12.5

## 2023-12-08 MED ORDER — CYCLOBENZAPRINE HCL 10 MG PO TABS
20.0000 mg | ORAL_TABLET | Freq: Every day | ORAL | Status: DC
  Administered 2023-12-08: 20 mg via ORAL
  Filled 2023-12-08: qty 2

## 2023-12-08 MED ORDER — ACETAMINOPHEN 500 MG PO TABS
1000.0000 mg | ORAL_TABLET | Freq: Once | ORAL | Status: AC
  Administered 2023-12-08: 1000 mg via ORAL
  Filled 2023-12-08: qty 2

## 2023-12-08 MED ORDER — METHYLPREDNISOLONE SODIUM SUCC 40 MG IJ SOLR
40.0000 mg | Freq: Two times a day (BID) | INTRAMUSCULAR | Status: DC
  Administered 2023-12-08 – 2023-12-09 (×3): 40 mg via INTRAVENOUS
  Filled 2023-12-08 (×3): qty 1

## 2023-12-08 MED ORDER — DILTIAZEM HCL ER COATED BEADS 120 MG PO CP24
120.0000 mg | ORAL_CAPSULE | Freq: Every day | ORAL | Status: DC
Start: 2023-12-08 — End: 2023-12-09
  Administered 2023-12-08 – 2023-12-09 (×2): 120 mg via ORAL
  Filled 2023-12-08 (×2): qty 1

## 2023-12-08 MED ORDER — IOHEXOL 350 MG/ML SOLN
100.0000 mL | Freq: Once | INTRAVENOUS | Status: AC | PRN
Start: 2023-12-08 — End: 2023-12-08
  Administered 2023-12-08: 100 mL via INTRAVENOUS

## 2023-12-08 MED ORDER — ARFORMOTEROL TARTRATE 15 MCG/2ML IN NEBU
15.0000 ug | INHALATION_SOLUTION | Freq: Two times a day (BID) | RESPIRATORY_TRACT | Status: DC
Start: 2023-12-08 — End: 2023-12-09
  Administered 2023-12-08 – 2023-12-09 (×3): 15 ug via RESPIRATORY_TRACT
  Filled 2023-12-08 (×3): qty 2

## 2023-12-08 MED ORDER — ACETAMINOPHEN 500 MG PO TABS
1000.0000 mg | ORAL_TABLET | Freq: Once | ORAL | Status: AC
  Administered 2023-12-09: 1000 mg via ORAL
  Filled 2023-12-08: qty 2

## 2023-12-08 MED ORDER — OXCARBAZEPINE 300 MG PO TABS
900.0000 mg | ORAL_TABLET | Freq: Two times a day (BID) | ORAL | Status: DC
Start: 2023-12-08 — End: 2023-12-09
  Administered 2023-12-08 – 2023-12-09 (×3): 900 mg via ORAL
  Filled 2023-12-08 (×4): qty 3

## 2023-12-08 MED ORDER — UMECLIDINIUM BROMIDE 62.5 MCG/ACT IN AEPB
1.0000 | INHALATION_SPRAY | Freq: Every day | RESPIRATORY_TRACT | Status: DC
Start: 2023-12-08 — End: 2023-12-09
  Administered 2023-12-08 – 2023-12-09 (×2): 1 via RESPIRATORY_TRACT
  Filled 2023-12-08: qty 7

## 2023-12-08 MED ORDER — GABAPENTIN 100 MG PO CAPS
100.0000 mg | ORAL_CAPSULE | Freq: Three times a day (TID) | ORAL | Status: DC
  Administered 2023-12-08 – 2023-12-09 (×4): 100 mg via ORAL
  Filled 2023-12-08 (×4): qty 1

## 2023-12-08 MED ORDER — CYCLOSPORINE 0.05 % OP EMUL
1.0000 [drp] | Freq: Two times a day (BID) | OPHTHALMIC | Status: DC
  Administered 2023-12-08 – 2023-12-09 (×3): 1 [drp] via OPHTHALMIC
  Filled 2023-12-08 (×4): qty 30

## 2023-12-08 MED ORDER — DULOXETINE HCL 60 MG PO CPEP
60.0000 mg | ORAL_CAPSULE | Freq: Two times a day (BID) | ORAL | Status: DC
Start: 2023-12-08 — End: 2023-12-09
  Administered 2023-12-08 – 2023-12-09 (×3): 60 mg via ORAL
  Filled 2023-12-08: qty 1
  Filled 2023-12-08: qty 2
  Filled 2023-12-08: qty 1

## 2023-12-08 MED ORDER — TRAZODONE HCL 50 MG PO TABS
75.0000 mg | ORAL_TABLET | Freq: Every day | ORAL | Status: DC
  Administered 2023-12-08: 75 mg via ORAL
  Filled 2023-12-08: qty 2

## 2023-12-08 MED ORDER — HYDROCODONE-ACETAMINOPHEN 10-325 MG PO TABS
1.0000 | ORAL_TABLET | Freq: Four times a day (QID) | ORAL | Status: DC | PRN
  Administered 2023-12-08 – 2023-12-09 (×2): 1 via ORAL
  Filled 2023-12-08 (×2): qty 1

## 2023-12-08 MED ORDER — FLUTICASONE PROPIONATE 50 MCG/ACT NA SUSP
2.0000 | Freq: Every morning | NASAL | Status: DC
  Administered 2023-12-08 – 2023-12-09 (×2): 2 via NASAL
  Filled 2023-12-08 (×2): qty 16

## 2023-12-08 MED ORDER — INSULIN ASPART 100 UNIT/ML IJ SOLN
0.0000 [IU] | Freq: Every day | INTRAMUSCULAR | Status: DC
  Filled 2023-12-08: qty 0.05

## 2023-12-08 MED ORDER — VANCOMYCIN HCL IN DEXTROSE 1-5 GM/200ML-% IV SOLN
1000.0000 mg | Freq: Two times a day (BID) | INTRAVENOUS | Status: DC
  Administered 2023-12-08 – 2023-12-09 (×2): 1000 mg via INTRAVENOUS
  Filled 2023-12-08 (×2): qty 200

## 2023-12-08 MED ORDER — TAMSULOSIN HCL 0.4 MG PO CAPS
0.8000 mg | ORAL_CAPSULE | Freq: Every morning | ORAL | Status: DC
  Administered 2023-12-08 – 2023-12-09 (×2): 0.8 mg via ORAL
  Filled 2023-12-08 (×2): qty 2

## 2023-12-08 MED ORDER — PANTOPRAZOLE SODIUM 40 MG PO TBEC
40.0000 mg | DELAYED_RELEASE_TABLET | Freq: Every day | ORAL | Status: DC
  Administered 2023-12-08 – 2023-12-09 (×2): 40 mg via ORAL
  Filled 2023-12-08 (×2): qty 1

## 2023-12-08 MED ORDER — VANCOMYCIN HCL 2000 MG/400ML IV SOLN
2000.0000 mg | Freq: Once | INTRAVENOUS | Status: AC
  Administered 2023-12-08: 2000 mg via INTRAVENOUS
  Filled 2023-12-08: qty 400

## 2023-12-08 MED ORDER — BUPROPION HCL ER (XL) 300 MG PO TB24
300.0000 mg | ORAL_TABLET | Freq: Every morning | ORAL | Status: DC
  Administered 2023-12-08 – 2023-12-09 (×2): 300 mg via ORAL
  Filled 2023-12-08: qty 2
  Filled 2023-12-08: qty 1

## 2023-12-08 MED ORDER — SODIUM CHLORIDE 0.9 % IV SOLN
2.0000 g | Freq: Once | INTRAVENOUS | Status: AC
  Administered 2023-12-08: 2 g via INTRAVENOUS
  Filled 2023-12-08: qty 12.5

## 2023-12-08 MED ORDER — ASPIRIN 81 MG PO TBEC
81.0000 mg | DELAYED_RELEASE_TABLET | Freq: Every day | ORAL | Status: DC
  Administered 2023-12-08 – 2023-12-09 (×2): 81 mg via ORAL
  Filled 2023-12-08 (×2): qty 1

## 2023-12-08 MED ORDER — INSULIN ASPART 100 UNIT/ML IJ SOLN
0.0000 [IU] | Freq: Three times a day (TID) | INTRAMUSCULAR | Status: DC
  Administered 2023-12-08: 3 [IU] via SUBCUTANEOUS
  Administered 2023-12-08: 2 [IU] via SUBCUTANEOUS
  Administered 2023-12-09 (×2): 3 [IU] via SUBCUTANEOUS
  Filled 2023-12-08: qty 0.15

## 2023-12-08 NOTE — ED Provider Notes (Addendum)
 Mario [name] Provider Note   CSN: [id number] Arrival date & time: 12/08/23  9742     Patient presents with: Shortness of Breath   Mario [name] is a 73 y.o. male.   The history is provided by the patient and the EMS personnel.  Shortness of Breath Severity:  Severe Onset quality:  Sudden Timing:  Constant Progression:  Unchanged Chronicity:  New Context: not URI   Relieved by:  Nothing Worsened by:  Nothing Ineffective treatments:  None tried Associated symptoms: cough   Associated symptoms: no fever, no vomiting and no wheezing   Risk factors: no prolonged immobilization   Patient with COPR and OSA discharged with SOB yesterday and was better and now SOB again with cough.      Past Medical History:  Diagnosis Date   Ankylosing spondylitis lumbar region [facility]) 09/25/2022   Anxiety    Bronchitis    COPD (chronic obstructive pulmonary disease) (HCC)    Depression    History of falling 07/29/2021   history of Pulmonary embolism (HCC) 07/02/2022   History of radiation therapy    Left lung- 10/05/20-10/15/20- Dr. Lynwood Nasuti   Hypertension    lung ca 09/2020   MI (myocardial infarction) [facility])    ????   On home oxygen  therapy    4L/min [place]   OSA (obstructive sleep apnea)    Suicidal ideation 06/23/2019   Suicide attempt [facility])    Tension pneumothorax 06/27/2016   Uveitis      Prior to Admission medications   Medication Sig Start Date End Date Taking? Authorizing Provider  acetaminophen  (TYLENOL ) 500 MG tablet Take 1,000 mg by mouth every 6 (six) hours as needed for mild pain (pain score 1-3), moderate pain (pain score 4-6) or headache.    [provider]  albuterol  (PROVENTIL ) (2.5 MG/3ML) 0.083% nebulizer solution Take 2.5 mg by nebulization every 6 (six) hours as needed for wheezing or shortness of breath.    [provider]  albuterol  (VENTOLIN  HFA) 108 (90 Base) MCG/ACT inhaler Inhale 2 puffs  into the lungs every 4 (four) hours as needed for wheezing or shortness of breath. 11/04/21   Cheryle Page, MD  aspirin  EC 81 MG tablet Take 81 mg by mouth daily. Swallow whole.    [provider]  bisacodyl  (DULCOLAX) 5 MG EC tablet Take 2 tablets (10 mg total) by mouth daily as needed for moderate constipation. Patient taking differently: Take 10 mg by mouth daily at 12 noon. 01/26/23   Armenta Canning, MD  BLACK CURRANT SEED OIL PO Take 2 capsules by mouth in the morning.    [provider]  buPROPion  (WELLBUTRIN  XL) 300 MG 24 hr tablet Take 300 mg by mouth every morning. 02/05/20   [provider]  busPIRone  (BUSPAR ) 15 MG tablet Take 15 mg by mouth in the morning.    [provider]  Calcium  Carbonate (CALCIUM  500 PO) Take 500 mg by mouth in the morning.    [provider]  Carboxymethylcellulose Sod PF 0.5 % SOLN Place 1 drop into both eyes 4 (four) times daily as needed (dry eyes).    [provider]  cholecalciferol  (VITAMIN D3) 25 MCG (1000 UNIT) tablet Take 1,000 Units by mouth daily.    [provider]  clobetasol  ointment (TEMOVATE ) 0.05 % Apply 1 Application topically 2 (two) times daily. For ankles and feet    [provider]  Cyanocobalamin (VITAMIN B12 PO) Take 1 tablet  by mouth in the morning.    [provider]  cyclobenzaprine  (FLEXERIL ) 10 MG tablet Take 20 mg by mouth at bedtime.    [provider]  cycloSPORINE  (RESTASIS ) 0.05 % ophthalmic emulsion Place 1 drop into both eyes every 12 (twelve) hours.    [provider]  diltiazem  (CARDIZEM  CD) 120 MG 24 hr capsule Take 1 capsule (120 mg total) by mouth daily. 11/28/23 02/26/24  Barbarann Nest, MD  docusate sodium  (COLACE) 100 MG capsule Take 100-200 mg by mouth 2 (two) times daily. Take two in am. Take one in pm.    [provider]  DULoxetine  (CYMBALTA ) 60 MG capsule Take 60 mg by mouth 2 (two) times daily.    [provider]  ferrous sulfate  325 (65 FE) MG tablet Take 325 mg by mouth daily with breakfast.    [provider]  fluticasone  (FLONASE ) 50 MCG/ACT nasal spray Place 2 sprays into both nostrils in the morning.    [provider]  gabapentin  (NEURONTIN ) 100 MG capsule Take 100 mg by mouth 3 (three) times daily.    [provider]  GARLIC OIL PO Take 1 capsule by mouth in the morning.    [provider]  Glucerna (GLUCERNA) LIQD Take 237 mLs by mouth daily at 12 noon. Glucerna Therapeutic Shake chocolate- Drink 1 bottle by mouth once a day    [provider]  guaiFENesin  (MUCINEX ) 600 MG 12 hr tablet Take 1 tablet (600 mg total) by mouth 2 (two) times daily as needed for cough or to loosen phlegm. 11/28/23   Barbarann Nest, MD  HYDROcodone -acetaminophen  (NORCO) 10-325 MG tablet Take 1 tablet by mouth every 6 (six) hours as needed for moderate pain (pain score 4-6).    [provider]  KRILL OIL PO Take 2 capsules by mouth daily.    [provider]  lactulose  (CHRONULAC ) 10 GM/15ML solution Take 20 g by mouth 2 (two) times daily as needed for mild constipation.    [provider]  lidocaine  (XYLOCAINE ) 5 % ointment Apply 1 Application topically 2 (two) times daily as needed (for pain- affected sites).    [provider]  magnesium  citrate SOLN Take 0.5 Bottles by mouth daily as needed for severe constipation.    [provider]  Magnesium  Oxide (MAG-OXIDE PO) Take 1 tablet by mouth daily.    [provider]  Melatonin 3 MG CAPS Take 6 mg by mouth at bedtime.    [provider]  Misc Natural Products (TURMERIC CURCUMIN) CAPS Take 3 capsules by mouth See admin instructions. Turmeric/Bioperine/Ginger/Garlic- Take 3 capsules by mouth once a day    [provider]  Multiple Vitamin (MULTIVITAMIN) tablet Take 1 tablet by mouth daily with breakfast.    [provider]  omeprazole   (PRILOSEC) 20 MG capsule Take 1 capsule (20 mg total) by mouth daily. 01/26/23   Armenta Canning, MD  Oxcarbazepine  (TRILEPTAL ) 300 MG tablet Take 900 mg by mouth 2 (two) times daily.    [provider]  OXYGEN  Inhale 6 L/min into the lungs continuous.    [provider]  polyethylene glycol powder (GLYCOLAX /MIRALAX ) 17 GM/SCOOP powder Take 17 g by mouth in the morning.    [provider]  predniSONE  (DELTASONE ) 10 MG tablet Take 3 tablets (30 mg total) by mouth daily for 3 days, THEN 2 tablets (20 mg total) daily for 3 days, THEN 1 tablet (10 mg total) daily for 3 days. 12/07/23 12/16/23  Vernon Ranks, MD  senna (SENOKOT) 8.6 MG TABS tablet Take 2 tablets (17.2 mg total) by mouth 2 (two) times daily. Patient taking differently: Take 1 tablet by mouth daily. 05/23/23   Will Almarie MATSU, MD  tamsulosin  (FLOMAX ) 0.4 MG CAPS capsule Take 0.8 mg by mouth in the morning.    [provider]  Tiotropium Bromide -Olodaterol 2.5-2.5 MCG/ACT AERS Inhale 2 puffs into the lungs every morning.    [provider]  traZODone  (DESYREL ) 50 MG tablet Take 75 mg by mouth at bedtime.    [provider]    Allergies: Beet [beta vulgaris], Demerol [meperidine], Zocor [simvastatin], and Liver    Review of Systems  Constitutional:  Negative for fever.  Respiratory:  Positive for cough and shortness of breath. Negative for wheezing.   Gastrointestinal:  Negative for vomiting.  All other systems reviewed and are negative.   Updated Vital Signs BP (!) 166/104   Pulse 91   Resp 18   SpO2 97%   Physical Exam Vitals and nursing note reviewed.  Constitutional:      General: He is not in acute distress.    Appearance: He is well-developed. He is not diaphoretic.  HENT:     Head: Normocephalic and atraumatic.     Nose: Nose normal.  Eyes:     Conjunctiva/sclera: Conjunctivae normal.     Pupils: Pupils are equal, round, and reactive to light.   Cardiovascular:     Rate and Rhythm: Normal rate and regular rhythm.     Pulses: Normal pulses.     Heart sounds: Normal heart sounds.  Pulmonary:     Effort: Pulmonary effort is normal.     Breath sounds: Rhonchi and rales present. No wheezing.  Abdominal:     General: Bowel sounds are normal.     Palpations: Abdomen is soft.     Tenderness: There is no abdominal tenderness. There is no guarding or rebound.  Musculoskeletal:        General: Normal range of motion.     Cervical back: Normal range of motion and neck supple.  Skin:    General: Skin is warm and dry.     Capillary Refill: Capillary refill takes less than 2 seconds.  Neurological:     General: No focal deficit present.     Mental Status: He is alert and oriented to person, place, and time.     Deep Tendon Reflexes: Reflexes normal.  Psychiatric:        Mood and Affect: Mood normal.     (all labs ordered are listed, but only abnormal results are displayed) Results for orders placed or performed during the hospital encounter of 12/08/23  Comprehensive metabolic panel   Collection Time: 12/08/23  3:05 AM  Result Value Ref Range   Sodium 135 135 - 145 mmol/L   Potassium 4.3 3.5 - 5.1 mmol/L   Chloride 97 (L) 98 - 111 mmol/L   CO2 27 22 - 32 mmol/L   Glucose, Bld 130 (H) 70 - 99 mg/dL   BUN 19 8 - 23 mg/dL   Creatinine, Ser 9.00 0.61 - 1.24 mg/dL   Calcium  9.8 8.9 - 10.3 mg/dL   Total Protein 7.4 6.5 - 8.1 g/dL   Albumin 4.2 3.5 - 5.0 g/dL   AST 20 15 - 41 U/L   ALT 22 0 - 44 U/L   Alkaline Phosphatase 88 38 - 126 U/L   Total Bilirubin 0.2 0.0 - 1.2 mg/dL   GFR,  Estimated >60 >60 mL/min   Anion gap 11 5 - 15  CBC with Differential   Collection Time: 12/08/23  3:05 AM  Result Value Ref Range   WBC 17.8 (H) 4.0 - 10.5 K/uL   RBC 5.07 4.22 - 5.81 MIL/uL   Hemoglobin 14.6 13.0 - 17.0 g/dL   HCT 53.6 60.9 - 47.9 %   MCV 91.3 80.0 - 100.0 fL   MCH 28.8 26.0 - 34.0 pg   MCHC 31.5 30.0 - 36.0 g/dL   RDW 85.1  88.4 - 84.4 %   Platelets 370 150 - 400 K/uL   nRBC 0.0 0.0 - 0.2 %   Neutrophils Relative % 72 %   Neutro Abs 13.0 (H) 1.7 - 7.7 K/uL   Lymphocytes Relative 16 %   Lymphs Abs 2.8 0.7 - 4.0 K/uL   Monocytes Relative 9 %   Monocytes Absolute 1.5 (H) 0.1 - 1.0 K/uL   Eosinophils Relative 2 %   Eosinophils Absolute 0.3 0.0 - 0.5 K/uL   Basophils Relative 0 %   Basophils Absolute 0.1 0.0 - 0.1 K/uL   Immature Granulocytes 1 %   Abs Immature Granulocytes 0.12 (H) 0.00 - 0.07 K/uL  Troponin T, High Sensitivity   Collection Time: 12/08/23  3:05 AM  Result Value Ref Range   Troponin T High Sensitivity 22 (H) 0 - 19 ng/L  Resp panel by RT-PCR (RSV, Flu A&B, Covid) Anterior Nasal Swab   Collection Time: 12/08/23  3:14 AM   Specimen: Anterior Nasal Swab  Result Value Ref Range   SARS Coronavirus 2 by RT PCR NEGATIVE NEGATIVE   Influenza A by PCR NEGATIVE NEGATIVE   Influenza B by PCR NEGATIVE NEGATIVE   Resp Syncytial Virus by PCR NEGATIVE NEGATIVE  Troponin T, High Sensitivity   Collection Time: 12/08/23  6:04 AM  Result Value Ref Range   Troponin T High Sensitivity 21 (H) 0 - 19 ng/L   CT Angio Chest PE W and/or Wo Contrast Result Date: 12/08/2023 EXAM: CTA of the Chest with contrast for PE 12/08/2023 05:00:00 AM TECHNIQUE: CTA of the chest was performed without and with the administration of 100 mL of iohexol  (OMNIPAQUE ) 350 MG/ML injection. Multiplanar reformatted images are provided for review. MIP images are provided for review. Automated exposure control, iterative reconstruction, and/or weight based adjustment of the mA/kV was utilized to reduce the radiation dose to as low as reasonably achievable. COMPARISON: 11/30/2023 CLINICAL HISTORY: Syncope/presyncope, cerebrovascular cause suspected. SHOB from home, d/c'd 10/9 for same complaint. 85% on 9L at home, came up to 94% on non-rebreather hx of COPD and lung cancer. Notes from chart; wbc's 17.8, GFR>60. FINDINGS: PULMONARY ARTERIES:  Pulmonary arteries are adequately opacified for evaluation. No pulmonary embolism. Main pulmonary artery is normal in caliber. MEDIASTINUM: The heart and pericardium demonstrate no acute abnormality. Aortic atherosclerotic calcification. Coronary artery calcifications. LYMPH NODES: Prominent mediastinal and hilar lymph nodes likely reactive in the setting of multifocal pneumonia. No axillary lymphadenopathy. LUNGS AND PLEURA: Emphysema and diffuse bronchial wall thickening compatible with COPD. Central airways are patent. Extensive airspace disease noted within both lungs which is most severe in the lower lobes. This is not significantly changed compared with the previous exam. Small right pleural effusion is unchanged. No pneumothorax. UPPER ABDOMEN: Unchanged complicated cyst arising from the lateral cortex of the right kidney measuring 2.2 cm and 41 hounsfield units. SOFT TISSUES AND BONES: Multiple remote healed left lateral rib fracture deformities. Partial ankylosis of the thoracic spine. IMPRESSION: 1.  No evidence of pulmonary embolism. 2. Extensive airspace disease within both lungs, most severe in the lower lobes, not significantly changed compared with the previous exam. 3. Emphysema and diffuse bronchial wall thickening compatible with COPD. 4. Small right pleural effusion, unchanged. 5. Aortic atherosclerosis and coronary artery calcifications. Electronically signed by: Waddell Calk MD 12/08/2023 05:54 AM EDT RP Workstation: [id number]   CT Angio Chest PE W/Cm &/Or Wo Cm Result Date: 11/30/2023 EXAM: CTA CHEST 11/30/2023 05:22:30 AM TECHNIQUE: CTA of the chest was performed without and with the administration of 100 mL iohexol  (OMNIPAQUE ) 350 MG/ML injection. Multiplanar reformatted images are provided for review. MIP images are provided for review. Automated exposure control, iterative reconstruction, and/or weight based adjustment of the mA/kV was utilized to reduce the radiation dose to as low as  reasonably achievable. COMPARISON: CT angiogram of the chest dated 07/20/2023. CLINICAL HISTORY: Pulmonary embolism (PE) suspected, high prob. Increased SOB, recent admission for COPD exacerbation, wbc's 16.5, GFR>60, hx of lung ca 09/2020, radiation to left lung; Port cxr prior to ct; 100 omni 350. FINDINGS: PULMONARY ARTERIES: Pulmonary arteries are adequately opacified for evaluation. No acute pulmonary embolus. Main pulmonary artery is normal in caliber. MEDIASTINUM: There is moderate calcific coronary artery disease. The pericardium demonstrates no acute abnormality. The thoracic aorta also demonstrates moderate calcific atheromatous disease. LYMPH NODES: There are numerous shotty mediastinal nodes, as before. No hilar or axillary lymphadenopathy. LUNGS AND PLEURA: There is moderate-to-severe central lobular emphysema present. There is extensive patchy opacification/consolidation of the lower lobes bilaterally and of the lingula. There are also patchy neuro bronchovascular nodular opacities within the right middle lobe. No evidence of pleural effusion or pneumothorax. UPPER ABDOMEN: Simple-appearing exophytic cysts arising from the kidneys bilaterally. The abdominal aorta demonstrates moderate calcific atheromatous disease. SOFT TISSUES AND BONES: No acute bone or soft tissue abnormality. IMPRESSION: 1. No evidence of pulmonary embolism. 2. Extensive multifocal pneumonia involving the lower lobes bilaterally, lingula, and right middle lobe. 3. Moderate-to-severe centrilobular emphysema. Electronically signed by: Evalene Coho MD 11/30/2023 05:50 AM EDT RP Workstation: [id number]   DG Chest Port 1 View Result Date: 11/30/2023 EXAM: 1 VIEW(S) XRAY OF THE CHEST 11/30/2023 03:07:00 AM COMPARISON: 11/23/2023 CLINICAL HISTORY: sob. Shortness of breath FINDINGS: LUNGS AND PLEURA: Patchy airspace opacities throughout left lung. Right lower lung zone airspace opacity. Small bilateral pleural effusions. No pulmonary  edema. No pneumothorax. HEART AND MEDIASTINUM: No acute abnormality of the cardiac and mediastinal silhouettes. BONES AND SOFT TISSUES: No acute osseous abnormality. IMPRESSION: 1. Patchy airspace opacities throughout the left lung and in the right lower lung similar to 11/23/2023. 2. Small bilateral pleural effusions. Electronically signed by: Norman Gatlin MD 11/30/2023 03:19 AM EDT RP Workstation: [id number]   DG Chest Port 1 View Result Date: 11/23/2023 EXAM: 1 VIEW(S) XRAY OF THE CHEST 11/23/2023 11:24:06 PM COMPARISON: CTA chest dated 07/20/2023. CLINICAL HISTORY: shortness of breath. Per chart - c/o SOB for the past 3-4 days. Per EMS, pts initial oxygen  saturation was 88% on his baseline 8L [place]. Pt was given one Duoneb treatment with some relief. On arrival, labored breathing noted with retractions and pt is unable to speak in full ; sentences with out taking a breath. Pt denies any recent sick contacts, chest pain, fevers, chills, n/v/d. Dr. Cottie at bedside and respiratory notified FINDINGS: LUNGS AND PLEURA: Stable multifocal opacities are present in the left mid/lower lung and right lower lung. While this has previously been considered suspicious for pneumonia, given the chronic appearance, multifocal semi-invasive peripheral adenocarcinoma (  formally, bronchoalveolar cell carcinoma) should be considered. Associated small bilateral pleural effusions, chronic. No pulmonary edema. No pneumothorax. HEART AND MEDIASTINUM: No acute abnormality of the cardiac and mediastinal silhouettes. BONES AND SOFT TISSUES: No acute osseous abnormality. IMPRESSION: 1. Stable multifocal opacities in the left mid/lower lung and right lower lung. Given chronic appearance, multifocal semi-invasive peripheral adenocarcinoma should be considered. 2. Small bilateral pleural effusions, chronic. Electronically signed by: Pinkie Pebbles MD 11/23/2023 11:29 PM EDT RP Workstation: [id number]    EKG:  EKG  Interpretation Date/Time:  Friday December 08 2023 03:04:46 EDT Ventricular Rate:  111 PR Interval:  188 QRS Duration:  118 QT Interval:  333 QTC Calculation: 453 R Axis:   -34  Text Interpretation: Sinus tachycardia Nonspecific intraventricular conduction delay Confirmed by Nettie, Janne Faulk ([id number]) on 12/08/2023 6:49:01 AM        Radiology: CT Angio Chest PE W and/or Wo Contrast Result Date: 12/08/2023 EXAM: CTA of the Chest with contrast for PE 12/08/2023 05:00:00 AM TECHNIQUE: CTA of the chest was performed without and with the administration of 100 mL of iohexol  (OMNIPAQUE ) 350 MG/ML injection. Multiplanar reformatted images are provided for review. MIP images are provided for review. Automated exposure control, iterative reconstruction, and/or weight based adjustment of the mA/kV was utilized to reduce the radiation dose to as low as reasonably achievable. COMPARISON: 11/30/2023 CLINICAL HISTORY: Syncope/presyncope, cerebrovascular cause suspected. SHOB from home, d/c'd 10/9 for same complaint. 85% on 9L at home, came up to 94% on non-rebreather hx of COPD and lung cancer. Notes from chart; wbc's 17.8, GFR>60. FINDINGS: PULMONARY ARTERIES: Pulmonary arteries are adequately opacified for evaluation. No pulmonary embolism. Main pulmonary artery is normal in caliber. MEDIASTINUM: The heart and pericardium demonstrate no acute abnormality. Aortic atherosclerotic calcification. Coronary artery calcifications. LYMPH NODES: Prominent mediastinal and hilar lymph nodes likely reactive in the setting of multifocal pneumonia. No axillary lymphadenopathy. LUNGS AND PLEURA: Emphysema and diffuse bronchial wall thickening compatible with COPD. Central airways are patent. Extensive airspace disease noted within both lungs which is most severe in the lower lobes. This is not significantly changed compared with the previous exam. Small right pleural effusion is unchanged. No pneumothorax. UPPER ABDOMEN: Unchanged  complicated cyst arising from the lateral cortex of the right kidney measuring 2.2 cm and 41 hounsfield units. SOFT TISSUES AND BONES: Multiple remote healed left lateral rib fracture deformities. Partial ankylosis of the thoracic spine. IMPRESSION: 1. No evidence of pulmonary embolism. 2. Extensive airspace disease within both lungs, most severe in the lower lobes, not significantly changed compared with the previous exam. 3. Emphysema and diffuse bronchial wall thickening compatible with COPD. 4. Small right pleural effusion, unchanged. 5. Aortic atherosclerosis and coronary artery calcifications. Electronically signed by: Waddell Calk MD 12/08/2023 05:54 AM EDT RP Workstation: [id number]     Procedures   Medications Ordered in the ED  ceFEPIme  (MAXIPIME ) 2 g in sodium chloride  0.9 % 100 mL IVPB (2 g Intravenous New Bag/Given 12/08/23 9361)  iohexol  (OMNIPAQUE ) 350 MG/ML injection 100 mL (100 mLs Intravenous Contrast Given 12/08/23 0452)  acetaminophen  (TYLENOL ) tablet 1,000 mg (1,000 mg Oral Given 12/08/23 9361)                                    Medical Decision Making Patient with SOB and cough   Amount and/or Complexity of Data Reviewed Independent Historian: EMS    Details: See above  Labs: ordered.  Details: Elevated troponins 21/22.  Negative covid and flu.  Elevated white count 17.8, normal hemoglobin 14.6, normal platelets, normal sodium 135, normal potassium 4.3, normal creatinine 0.99, normal LFTs Radiology: ordered and independent interpretation performed.    Details: No PE by me  ECG/medicine tests: ordered and independent interpretation performed. Decision-making details documented in ED Course.  Risk OTC drugs. Prescription drug management. Decision regarding hospitalization.     Final diagnoses:  Dyspnea, unspecified type  HCAP (healthcare-associated pneumonia)  Elevated troponin   The patient appears reasonably stabilized for admission considering the  current resources, flow, and capabilities available in the ED at this time, and I doubt any other [facility] requiring further screening and/or treatment in the ED prior to admission.  ED Discharge Orders     None          Whalen Trompeter, MD 12/08/23 9346    Nettie Earing, MD 12/08/23 9344

## 2023-12-08 NOTE — Progress Notes (Signed)
 Pharmacy Note   A consult was received from an ED physician for cefepime  per pharmacy dosing.    The patient's profile has been reviewed for ht/wt/allergies/indication/available labs.    A one time order has been placed for cefepime  2 gr IV x 1 .    Further antibiotics/pharmacy consults should be ordered by admitting physician if indicated.                       Thank you,  Dolphus Roller, PharmD, BCPS 12/08/2023 6:22 AM

## 2023-12-08 NOTE — Progress Notes (Signed)
 Pharmacy Antibiotic Note  Mario [name] is a 73 y.o. male admitted on 12/08/2023 with shortness of breath. Pharmacy has been consulted for vancomycin  and cefepime  dosing for pneumonia. Patient was recently admitted on 10/2 and discharged 10/9, presents back to hospital approximately 12 hours post-discharge with shortness of breath. Patient recently on IV vancomycin  and cefepime  10/2 - 10/7 for pneumonia.   Plan: -Vancomycin  2000 mg IV x 1 followed by 1000 mg IV q12h -Cefepime  2 g IV q8h     Temp (24hrs), Avg:97.9 F (36.6 C), Min:97.8 F (36.6 C), Max:98 F (36.7 C)  Recent Labs  Lab 12/02/23 0859 12/03/23 0533 12/04/23 0522 12/07/23 0538 12/07/23 0828 12/08/23 0305  WBC 12.5* 12.4* 13.4*  --   --  17.8*  CREATININE  --  0.85 0.85 0.94 0.94 0.99    Estimated Creatinine Clearance: 95.8 mL/min (by C-G formula based on SCr of 0.99 mg/dL).    Allergies  Allergen Reactions   Beet [Beta Vulgaris] Nausea And Vomiting   Demerol [Meperidine] Nausea And Vomiting and Other (See Comments)    Made the patient violently sick   Zocor [Simvastatin] Nausea And Vomiting and Other (See Comments)    Made patient feel very jittery, also   Liver Nausea And Vomiting and Other (See Comments)    ANY Liver!!    Antimicrobials this admission: Cefepime  10/10 >> Vancomycin  10/10 >>  Recent antibiotics during 10/2-10/9 admission: Cefepime  10/2 >> 10/7 Vancomycin  10/2 >> 10/7  Dose adjustments this admission: NA  Microbiology results: 10/10 BCx: pending 10/2 MRSA PCR: positive  Thank you for allowing pharmacy to be a part of this patient's care.  Stefano MARLA Bologna, PharmD, BCPS Clinical Pharmacist 12/08/2023 9:36 AM

## 2023-12-08 NOTE — ED Triage Notes (Signed)
 Pt BIB [facility] c/o SHOB from home, d/c'd 10/9 for same complaint. 85% on 9L at home, came up to 94% on non-rebreather hx of COPD and lung cancer.  PTA 20 L wrist HR 116 BP 122/76

## 2023-12-08 NOTE — H&P (Signed)
 History and Physical  LYN DEEMER FMW:[id number] DOB: September 14, 1950 DOA: 12/08/2023  PCP: [name]   Chief Complaint: Shortness of breath, cough  HPI: Mario [name] is a 73 y.o. male with medical history significant for ankylosing spondylitis, chronic hypoxic respiratory failure due to COPD on 8 L nasal cannula oxygen  at baseline, depression with prior history of suicide attempt, hypertension, OSA, morbid obesity, type 2 diabetes, CKD stage IIIa who was recently hospitalized for acute on chronic hypoxic respiratory failure in the setting of multifocal pneumonia and discharged home on 10/9 after completing antibiotic therapy and is now being readmitted to the hospital with worsening dyspnea and shortness of breath.  Patient states that he was moderately better at the time of discharge yesterday, he was discharged home on 9 L nasal cannula oxygen  and was saturating greater than 90%.  Patient states that after he got home he was doing okay, but early this morning about 2:30 AM he started having coughing spells, and was short of breath.  He tried doing a breathing treatment but it did not help, so he called EMS.  He was noted by EMS to be saturating 85% on 9 L came up to 94% on nonrebreather mask.  He denies any chest pain, productive cough, or fever.  No nausea or vomiting or other concerns.  Review of Systems: Please see HPI for pertinent positives and negatives. A complete 10 system review of systems are otherwise negative.  Past Medical History:  Diagnosis Date   Ankylosing spondylitis lumbar region [facility]) 09/25/2022   Anxiety    Bronchitis    COPD (chronic obstructive pulmonary disease) (HCC)    Depression    History of falling 07/29/2021   history of Pulmonary embolism (HCC) 07/02/2022   History of radiation therapy    Left lung- 10/05/20-10/15/20- Dr. Lynwood Nasuti   Hypertension    lung ca 09/2020   MI (myocardial infarction) [facility])    ????   On home oxygen  therapy     4L/min [place]   OSA (obstructive sleep apnea)    Suicidal ideation 06/23/2019   Suicide attempt (HCC)    Tension pneumothorax 06/27/2016   Uveitis    Past Surgical History:  Procedure Laterality Date   BIOPSY  07/03/2021   Procedure: BIOPSY;  Surgeon: Elicia Claw, MD;  Location: [facility] ENDOSCOPY;  Service: Gastroenterology;;   BRONCHIAL BIOPSY  07/30/2020   Procedure: BRONCHIAL BIOPSIES;  Surgeon: Brenna Adine CROME, DO;  Location: [facility] ENDOSCOPY;  Service: Pulmonary;;   BRONCHIAL BRUSHINGS  07/30/2020   Procedure: BRONCHIAL BRUSHINGS;  Surgeon: Brenna Adine CROME, DO;  Location: [facility] ENDOSCOPY;  Service: Pulmonary;;   BRONCHIAL NEEDLE ASPIRATION BIOPSY  07/30/2020   Procedure: BRONCHIAL NEEDLE ASPIRATION BIOPSIES;  Surgeon: Brenna Adine CROME, DO;  Location: [facility] ENDOSCOPY;  Service: Pulmonary;;   BRONCHIAL WASHINGS  07/30/2020   Procedure: BRONCHIAL WASHINGS;  Surgeon: Brenna Adine CROME, DO;  Location: [facility] ENDOSCOPY;  Service: Pulmonary;;   BRONCHIAL WASHINGS  04/17/2023   Procedure: BRONCHIAL WASHINGS;  Surgeon: Claudene Toribio BROCKS, MD;  Location: [facility] ENDOSCOPY;  Service: Pulmonary;;   CHEST TUBE INSERTION Left 06/27/2016   CRYOTHERAPY  04/17/2023   Procedure: CRYOTHERAPY;  Surgeon: Claudene Toribio BROCKS, MD;  Location: [facility] ENDOSCOPY;  Service: Pulmonary;;   cryptorchidism     ESOPHAGOGASTRODUODENOSCOPY N/A 07/03/2021   Procedure: ESOPHAGOGASTRODUODENOSCOPY (EGD);  Surgeon: Elicia Claw, MD;  Location: [facility];  Service: Gastroenterology;  Laterality: N/A;   HEMOSTASIS CONTROL  04/17/2023   Procedure: HEMOSTASIS CONTROL;  Surgeon: Claudene,  Toribio BROCKS, MD;  Location: [facility] ENDOSCOPY;  Service: Pulmonary;;   IR PERC PLEURAL DRAIN W/INDWELL CATH W/IMG GUIDE  08/04/2021   IR REMOVAL OF PLURAL CATH W/CUFF  09/01/2021   SKIN CANCER EXCISION     VIDEO BRONCHOSCOPY Left 04/17/2023   Procedure: VIDEO BRONCHOSCOPY WITHOUT FLUORO;  Surgeon: Claudene Toribio BROCKS, MD;  Location: [facility] ENDOSCOPY;  Service: Pulmonary;  Laterality: Left;    VIDEO BRONCHOSCOPY WITH ENDOBRONCHIAL NAVIGATION Left 07/30/2020   Procedure: VIDEO BRONCHOSCOPY WITH ENDOBRONCHIAL NAVIGATION;  Surgeon: Brenna Adine CROME, DO;  Location: [facility] ENDOSCOPY;  Service: Pulmonary;  Laterality: Left;   Social History:  reports that he quit smoking about 7 years ago. His smoking use included cigarettes. He started smoking about 42 years ago. He has a 35 pack-year smoking history. He has been exposed to tobacco smoke. He has never used smokeless tobacco. He reports that he does not drink alcohol  and does not use drugs.  Allergies  Allergen Reactions   Beet [Beta Vulgaris] Nausea And Vomiting   Demerol [Meperidine] Nausea And Vomiting and Other (See Comments)    Made the patient violently sick   Zocor [Simvastatin] Nausea And Vomiting and Other (See Comments)    Made patient feel very jittery, also   Liver Nausea And Vomiting and Other (See Comments)    ANY Liver!!    Family History  Problem Relation Age of Onset   Dementia Father      Prior to Admission medications   Medication Sig Start Date End Date Taking? Authorizing Provider  acetaminophen  (TYLENOL ) 500 MG tablet Take 1,000 mg by mouth every 6 (six) hours as needed for mild pain (pain score 1-3), moderate pain (pain score 4-6) or headache.    [provider]  albuterol  (PROVENTIL ) (2.5 MG/3ML) 0.083% nebulizer solution Take 2.5 mg by nebulization every 6 (six) hours as needed for wheezing or shortness of breath.    [provider]  albuterol  (VENTOLIN  HFA) 108 (90 Base) MCG/ACT inhaler Inhale 2 puffs into the lungs every 4 (four) hours as needed for wheezing or shortness of breath. 11/04/21   Cheryle Page, MD  aspirin  EC 81 MG tablet Take 81 mg by mouth daily. Swallow whole.    [provider]  bisacodyl  (DULCOLAX) 5 MG EC tablet Take 2 tablets (10 mg total) by mouth daily as needed for moderate constipation. Patient taking differently: Take 10 mg by mouth daily at 12 noon. 01/26/23    Armenta Canning, MD  BLACK CURRANT SEED OIL PO Take 2 capsules by mouth in the morning.    [provider]  buPROPion  (WELLBUTRIN  XL) 300 MG 24 hr tablet Take 300 mg by mouth every morning. 02/05/20   [provider]  busPIRone  (BUSPAR ) 15 MG tablet Take 15 mg by mouth in the morning.    [provider]  Calcium  Carbonate (CALCIUM  500 PO) Take 500 mg by mouth in the morning.    [provider]  Carboxymethylcellulose Sod PF 0.5 % SOLN Place 1 drop into both eyes 4 (four) times daily as needed (dry eyes).    [provider]  cholecalciferol  (VITAMIN D3) 25 MCG (1000 UNIT) tablet Take 1,000 Units by mouth daily.    [provider]  clobetasol  ointment (TEMOVATE ) 0.05 % Apply 1 Application topically 2 (two) times daily. For ankles and feet    [provider]  Cyanocobalamin (VITAMIN B12 PO) Take 1 tablet by mouth in the morning.    [provider]  cyclobenzaprine  (FLEXERIL ) 10  MG tablet Take 20 mg by mouth at bedtime.    [provider]  cycloSPORINE  (RESTASIS ) 0.05 % ophthalmic emulsion Place 1 drop into both eyes every 12 (twelve) hours.    [provider]  diltiazem  (CARDIZEM  CD) 120 MG 24 hr capsule Take 1 capsule (120 mg total) by mouth daily. 11/28/23 02/26/24  Barbarann Nest, MD  docusate sodium  (COLACE) 100 MG capsule Take 100-200 mg by mouth 2 (two) times daily. Take two in am. Take one in pm.    [provider]  DULoxetine  (CYMBALTA ) 60 MG capsule Take 60 mg by mouth 2 (two) times daily.    [provider]  ferrous sulfate  325 (65 FE) MG tablet Take 325 mg by mouth daily with breakfast.    [provider]  fluticasone  (FLONASE ) 50 MCG/ACT nasal spray Place 2 sprays into both nostrils in the morning.    [provider]  gabapentin  (NEURONTIN ) 100 MG capsule Take 100 mg by mouth 3 (three) times daily.    [provider]  GARLIC OIL PO Take 1 capsule by mouth in  the morning.    [provider]  Glucerna (GLUCERNA) LIQD Take 237 mLs by mouth daily at 12 noon. Glucerna Therapeutic Shake chocolate- Drink 1 bottle by mouth once a day    [provider]  guaiFENesin  (MUCINEX ) 600 MG 12 hr tablet Take 1 tablet (600 mg total) by mouth 2 (two) times daily as needed for cough or to loosen phlegm. 11/28/23   Barbarann Nest, MD  HYDROcodone -acetaminophen  (NORCO) 10-325 MG tablet Take 1 tablet by mouth every 6 (six) hours as needed for moderate pain (pain score 4-6).    [provider]  KRILL OIL PO Take 2 capsules by mouth daily.    [provider]  lactulose  (CHRONULAC ) 10 GM/15ML solution Take 20 g by mouth 2 (two) times daily as needed for mild constipation.    [provider]  lidocaine  (XYLOCAINE ) 5 % ointment Apply 1 Application topically 2 (two) times daily as needed (for pain- affected sites).    [provider]  magnesium  citrate SOLN Take 0.5 Bottles by mouth daily as needed for severe constipation.    [provider]  Magnesium  Oxide (MAG-OXIDE PO) Take 1 tablet by mouth daily.    [provider]  Melatonin 3 MG CAPS Take 6 mg by mouth at bedtime.    [provider]  Misc Natural Products (TURMERIC CURCUMIN) CAPS Take 3 capsules by mouth See admin instructions. Turmeric/Bioperine/Ginger/Garlic- Take 3 capsules by mouth once a day    [provider]  Multiple Vitamin (MULTIVITAMIN) tablet Take 1 tablet by mouth daily with breakfast.    [provider]  omeprazole  (PRILOSEC) 20 MG capsule Take 1 capsule (20 mg total) by mouth daily. 01/26/23   Armenta Canning, MD  Oxcarbazepine  (TRILEPTAL ) 300 MG tablet Take 900 mg by mouth 2 (two) times daily.    [provider]  OXYGEN  Inhale 6 L/min into the lungs continuous.    [provider]  polyethylene glycol powder (GLYCOLAX /MIRALAX ) 17 GM/SCOOP powder Take 17 g by mouth in the morning.    [provider]  predniSONE  (DELTASONE ) 10 MG tablet Take 3 tablets (30 mg total) by mouth daily for 3 days, THEN 2 tablets (20 mg total) daily for 3 days, THEN 1 tablet (10 mg total) daily for 3 days. 12/07/23 12/16/23  Pahwani, Ravi, MD  senna (SENOKOT) 8.6 MG TABS tablet Take 2 tablets (17.2 mg  total) by mouth 2 (two) times daily. Patient taking differently: Take 1 tablet by mouth daily. 05/23/23   Will Almarie MATSU, MD  tamsulosin  (FLOMAX ) 0.4 MG CAPS capsule Take 0.8 mg by mouth in the morning.    [provider]  Tiotropium Bromide -Olodaterol 2.5-2.5 MCG/ACT AERS Inhale 2 puffs into the lungs every morning.    [provider]  traZODone  (DESYREL ) 50 MG tablet Take 75 mg by mouth at bedtime.    [provider]    Physical Exam: BP 114/77 (BP Location: Right Arm)   Pulse 85   Temp 97.8 F (36.6 C) (Oral)   Resp 16   SpO2 92%  General:  Alert, oriented, calm, in no acute distress, wearing 9 L nasal cannula oxygen .  Intermittent dry sounding cough, speaking in full sentences. Eyes: EOMI, clear conjuctivae, white sclerea Neck: supple, no masses, trachea mildline  Cardiovascular: RRR, no murmurs or rubs, no peripheral edema  Respiratory: Breath sounds are distant, without active wheezing, some diffuse rhonchi.  Mild tachypnea when talking.  No retractions, no stridor. Abdomen: soft, nontender, morbidly obese, normal bowel tones heard  Skin: dry, no rashes  Musculoskeletal: no joint effusions, normal range of motion  Psychiatric: appropriate affect, normal speech  Neurologic: extraocular muscles intact, clear speech, moving all extremities with intact sensorium         Labs on Admission:  Basic Metabolic Panel: Recent Labs  Lab 12/02/23 0621 12/03/23 0533 12/04/23 0522 12/07/23 0538 12/07/23 0828 12/08/23 0305  NA 135 136 134*  --  136 135  K 4.3 3.9 3.7  --  4.8 4.3  CL 100 100 101  --  97* 97*  CO2 25 26 24   --  32 27  GLUCOSE 90 118* 113*  --   106* 130*  BUN 17 15 16   --  15 19  CREATININE 0.84 0.85 0.85 0.94 0.94 0.99  CALCIUM  8.9 8.8* 8.9  --  9.3 9.8   Liver Function Tests: Recent Labs  Lab 12/08/23 0305  AST 20  ALT 22  ALKPHOS 88  BILITOT 0.2  PROT 7.4  ALBUMIN 4.2   No results for input(s): LIPASE, AMYLASE in the last 168 hours. No results for input(s): AMMONIA in the last 168 hours. CBC: Recent Labs  Lab 12/02/23 0859 12/03/23 0533 12/04/23 0522 12/08/23 0305  WBC 12.5* 12.4* 13.4* 17.8*  NEUTROABS  --   --   --  13.0*  HGB 13.4 12.7* 13.9 14.6  HCT 42.6 41.3 43.6 46.3  MCV 93.4 92.0 90.8 91.3  PLT 336 320 322 370   Cardiac Enzymes: No results for input(s): CKTOTAL, CKMB, CKMBINDEX, TROPONINI in the last 168 hours. BNP (last 3 results) Recent Labs    05/16/23 1529 05/17/23 2012 07/19/23 1927  BNP 41.5 46.5 25.4    ProBNP (last 3 results) Recent Labs    11/23/23 2315 11/30/23 0300 12/07/23 0828  PROBNP 85.2 127.0 52.1    CBG: Recent Labs  Lab 12/06/23 1130 12/06/23 1638 12/06/23 2022 12/07/23 0753 12/07/23 1147  GLUCAP 193* 169* 221* 110* 177*    Radiological Exams on Admission: CT Angio Chest PE W and/or Wo Contrast Result Date: 12/08/2023 EXAM: CTA of the Chest with contrast for PE 12/08/2023 05:00:00 AM TECHNIQUE: CTA of the chest was performed without and with the administration of 100 mL of iohexol  (OMNIPAQUE ) 350 MG/ML injection. Multiplanar reformatted images are provided for review. MIP images are provided for review. Automated exposure control, iterative reconstruction, and/or weight based adjustment  of the mA/kV was utilized to reduce the radiation dose to as low as reasonably achievable. COMPARISON: 11/30/2023 CLINICAL HISTORY: Syncope/presyncope, cerebrovascular cause suspected. SHOB from home, d/c'd 10/9 for same complaint. 85% on 9L at home, came up to 94% on non-rebreather hx of COPD and lung cancer. Notes from chart; wbc's 17.8, GFR>60. FINDINGS:  PULMONARY ARTERIES: Pulmonary arteries are adequately opacified for evaluation. No pulmonary embolism. Main pulmonary artery is normal in caliber. MEDIASTINUM: The heart and pericardium demonstrate no acute abnormality. Aortic atherosclerotic calcification. Coronary artery calcifications. LYMPH NODES: Prominent mediastinal and hilar lymph nodes likely reactive in the setting of multifocal pneumonia. No axillary lymphadenopathy. LUNGS AND PLEURA: Emphysema and diffuse bronchial wall thickening compatible with COPD. Central airways are patent. Extensive airspace disease noted within both lungs which is most severe in the lower lobes. This is not significantly changed compared with the previous exam. Small right pleural effusion is unchanged. No pneumothorax. UPPER ABDOMEN: Unchanged complicated cyst arising from the lateral cortex of the right kidney measuring 2.2 cm and 41 hounsfield units. SOFT TISSUES AND BONES: Multiple remote healed left lateral rib fracture deformities. Partial ankylosis of the thoracic spine. IMPRESSION: 1. No evidence of pulmonary embolism. 2. Extensive airspace disease within both lungs, most severe in the lower lobes, not significantly changed compared with the previous exam. 3. Emphysema and diffuse bronchial wall thickening compatible with COPD. 4. Small right pleural effusion, unchanged. 5. Aortic atherosclerosis and coronary artery calcifications. Electronically signed by: Waddell Calk MD 12/08/2023 05:54 AM EDT RP Workstation: [id number]   Assessment/Plan SHARBEL SAHAGUN is a 73 y.o. male with medical history significant for ankylosing spondylitis, chronic hypoxic respiratory failure due to COPD on 8 L nasal cannula oxygen  at baseline, depression with prior history of suicide attempt, hypertension, OSA, morbid obesity, type 2 diabetes, CKD stage IIIa who was recently hospitalized for acute on chronic hypoxic respiratory failure in the setting of multifocal pneumonia and discharged  home on 10/9 after completing antibiotic therapy and is now being readmitted to the hospital with worsening dyspnea and shortness of breath.   Shortness of breath, acute on chronic hypoxia, multifocal pneumonia-patient has had 3 hospitalizations in the last couple of months with concerns of acute on chronic hypoxia due to COPD exacerbation versus multifocal pneumonia.  He has stable CT changes with extensive airspace disease in both lungs, most severe in the lower lobes not significantly changed despite recent empiric treatment for multifocal pneumonia.  Pulmonary evaluation on 11/24/2023 during his last hospitalization notes chronic bilateral infiltrates at least partially related to history of left lung cancer status post radiation.  He has no fever or frankly infectious symptoms, he has increased cough and moderately worse than baseline hypoxia which is concerning for COPD exacerbation.  He does not look significantly volume overloaded on exam, and BNP is normal so I do not think fluid status is playing a role.  Will plan to treat empirically as below, I do not feel there is much for pulmonology to add at the moment so consultation was not requested at this time. -Inpatient admission -Continue supplemental oxygen , wean as tolerated though he is not far off from his baseline requirements -Will treat empirically with IV vancomycin  and IV cefepime , though I do not have a high suspicion for acute bacterial infection -Add Incruse daily and Brovana  nebulizer twice daily, Flonase  daily -IV Solu-Medrol  40 mg twice daily -Albuterol  nebulizer as needed  Leukocytosis-could be due to recent steroid therapy, though has actually increased since last labs were done  on 10/6.  This is part of what prompts me to treat empirically with IV antibiotics.  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  He was supposed to  follow-up with oncology per his preference at the [place], but was lost to follow-up.  Most recently, plan is for him to follow-up here at the cancer center with Dr. [name].  CKD stage IIIa-renal function appears to be at baseline  Hypertension-continue Cardizem   Depression/bipolar disorder-Cymbalta , Wellbutrin , BuSpar , Trileptal   Type 2 diabetes-not on home medications, anticipate some hyperglycemia with IV steroids -Carb modified diet -Moderate dose sliding scale insulin   Neuropathy-gabapentin  3 times daily  GERD-Protonix   BPH-Flomax   DVT prophylaxis: Lovenox      Code Status: Limited: Do not attempt resuscitation (DNR) -DNR-LIMITED -Do Not Intubate/DNI   Consults called: None  Admission status: The appropriate patient status for this patient is INPATIENT. Inpatient status is judged to be reasonable and necessary in order to provide the required intensity of service to ensure the patient's safety. The patient's presenting symptoms, physical exam findings, and initial radiographic and laboratory data in the context of their chronic comorbidities is felt to place them at high risk for further clinical deterioration. Furthermore, it is not anticipated that the patient will be medically stable for discharge from the hospital within 2 midnights of admission.    I certify that at the point of admission it is my clinical judgment that the patient will require inpatient hospital care spanning beyond 2 midnights from the point of admission due to high intensity of service, high risk for further deterioration and high frequency of surveillance required  Time spent: 59 minutes  Zhania Shaheen CHRISTELLA Gail MD [facility] Pager [phone]  If 7PM-7AM, please contact night-coverage [email or web address] Password [email or web address]  12/08/2023, 10:28 AM

## 2023-12-08 NOTE — Progress Notes (Signed)
   12/08/23 2232  BiPAP/CPAP/SIPAP  Reason BIPAP/CPAP not in use Other(comment) (Patient stated he is not ready to put on at this time, and that he knows how to put it on himself. I set it up for him and told him to let the nurse know to call me if he needs any help.)

## 2023-12-09 DIAGNOSIS — C7800 Secondary malignant neoplasm of unspecified lung: Secondary | ICD-10-CM | POA: Diagnosis not present

## 2023-12-09 DIAGNOSIS — J449 Chronic obstructive pulmonary disease, unspecified: Secondary | ICD-10-CM | POA: Diagnosis not present

## 2023-12-09 DIAGNOSIS — J9611 Chronic respiratory failure with hypoxia: Secondary | ICD-10-CM

## 2023-12-09 LAB — GLUCOSE, CAPILLARY
Glucose-Capillary: 182 mg/dL — ABNORMAL HIGH (ref 70–99)
Glucose-Capillary: 157 mg/dL — ABNORMAL HIGH (ref 70–99)
Glucose-Capillary: 207 mg/dL — ABNORMAL HIGH (ref 70–99)

## 2023-12-09 NOTE — Plan of Care (Signed)
  Problem: Fluid Volume: Goal: Ability to maintain a balanced intake and output will improve Outcome: Adequate for Discharge   Problem: Metabolic: Goal: Ability to maintain appropriate glucose levels will improve Outcome: Adequate for Discharge   Problem: Clinical Measurements: Goal: Respiratory complications will improve Outcome: Adequate for Discharge   Problem: Clinical Measurements: Goal: Cardiovascular complication will be avoided Outcome: Adequate for Discharge

## 2023-12-09 NOTE — Evaluation (Signed)
 Occupational Therapy Evaluation Patient Details Name: Mario [name] MRN: [id number] DOB: [birth year] Today's Date: 12/09/2023   History of Present Illness   73 y.o. male with medical history significant for ankylosing spondylitis, chronic hypoxic respiratory failure due to COPD on 8 L nasal cannula oxygen  at baseline, depression with prior history of suicide attempt, hypertension, OSA, morbid obesity, type 2 diabetes, CKD stage IIIa who was recently hospitalized for acute on chronic hypoxic respiratory failure in the setting of multifocal pneumonia and discharged home on 10/9 after completing antibiotic therapy and is now being readmitted to the hospital on 10/10 with worsening dyspnea and shortness of breath.     Clinical Impressions Pt c/o SOB, feeling better than admission. Pt eager to return home, lives alone, has support of neighbors, PLOF mod I for ADLs, frequent rest breaks due to poor activity tolerance, quickly desats with O2 upon mild exertion. Pt currently close to baseline, mod I overall for ADLs, supervision for safety with mobility, no AD. HHOT follow up recommended, mobility to follow to maximize activity tolerance, no acute OT needed. Spoke with Pt about energy conservation strategies and general nutrition guidelines for healthy eating, instructed to listen to MD for specialized supplements or diets. Signing off.      If plan is discharge home, recommend the following:   A little help with walking and/or transfers;A little help with bathing/dressing/bathroom;Assistance with cooking/housework;Assist for transportation     Functional Status Assessment   Patient has had a recent decline in their functional status and demonstrates the ability to make significant improvements in function in a reasonable and predictable amount of time.     Equipment Recommendations   None recommended by OT     Recommendations for Other Services         Precautions/Restrictions    Precautions Precautions: Fall;Other (comment) Recall of Precautions/Restrictions: Intact Precaution/Restrictions Comments: pt has his own pulse oximeter, baseline 8L at home Restrictions Weight Bearing Restrictions Per Provider Order: No     Mobility Bed Mobility Overal bed mobility: Independent                  Transfers Overall transfer level: Modified independent                        Balance Overall balance assessment: No apparent balance deficits (not formally assessed)                                         ADL either performed or assessed with clinical judgement   ADL Overall ADL's : At baseline;Modified independent                                       General ADL Comments: overall mod I, very poor activity tolerance, at baseilne     Vision Baseline Vision/History: 1 Wears glasses Ability to See in Adequate Light: 0 Adequate Patient Visual Report: No change from baseline       Perception         Praxis         Pertinent Vitals/Pain Pain Assessment Pain Assessment: No/denies pain     Extremity/Trunk Assessment Upper Extremity Assessment Upper Extremity Assessment: Overall WFL for tasks assessed   Lower Extremity Assessment Lower Extremity Assessment: Defer to PT evaluation  Communication Communication Communication: No apparent difficulties   Cognition Arousal: Alert Behavior During Therapy: WFL for tasks assessed/performed Cognition: No apparent impairments                               Following commands: Intact       Cueing  General Comments   Cueing Techniques: Verbal cues  desats to 86 after very brief walk, several minutes on 8L O2 to recover   Exercises     Shoulder Instructions      Home Living Family/patient expects to be discharged to:: Private residence Living Arrangements: Non-relatives/Friends Available Help at Discharge: Available 24  hours/day Type of Home: House Home Access: Level entry     Home Layout: Two level Alternate Level Stairs-Number of Steps: Split Level : enter on main level with kitchen/living  up 5 steps to bedroom and bathroom Alternate Level Stairs-Rails: Left Bathroom Shower/Tub: Chief Strategy Officer: Standard Bathroom Accessibility: Yes   Home Equipment: Cane - single point;Shower Counsellor (2 wheels);Rollator (4 wheels);Other (comment);Hand held shower head;Adaptive equipment Adaptive Equipment: Reacher;Long-handled sponge Additional Comments: housemate lives across the hall helps too; [place] aide comes 5 hours/day 3 days/week helps with cleaning; [place] for transportation (though recently couldn't walk to the [place]). On 8L O2 at home      Prior Functioning/Environment Prior Level of Function : Needs assist             Mobility Comments: Ambulates in home; gets SOB easily, seated rest breaks to recover; walks with AD; no falls in past 6 months. On 8L O2 at home. ADLs Comments: sits to shower, showering is very taxing; aide helps with dressing, pt showers independently with Aide nearby for safety. Housemate does havey housework and Aide does light housework. Aide does meal prep and laundry but cannot provide transportation.    OT Problem List: Decreased activity tolerance;Impaired balance (sitting and/or standing)   OT Treatment/Interventions: Self-care/ADL training;Therapeutic exercise;Energy conservation;Therapeutic activities      OT Goals(Current goals can be found in the care plan section)   Acute Rehab OT Goals Patient Stated Goal: to return home OT Goal Formulation: With patient Time For Goal Achievement: 12/23/23 Potential to Achieve Goals: Fair   OT Frequency:  Min 2X/week    Co-evaluation              AM-PAC OT 6 Clicks Daily Activity     Outcome Measure Help from another person eating meals?: None Help from another person taking care of personal  grooming?: A Little Help from another person toileting, which includes using toliet, bedpan, or urinal?: A Little Help from another person bathing (including washing, rinsing, drying)?: A Little Help from another person to put on and taking off regular upper body clothing?: None Help from another person to put on and taking off regular lower body clothing?: A Little 6 Click Score: 20   End of Session Equipment Utilized During Treatment: Oxygen  Nurse Communication: Mobility status  Activity Tolerance: Patient limited by fatigue Patient left: in bed;with call bell/phone within reach  OT Visit Diagnosis: Unsteadiness on feet (R26.81);Other (comment) (decreased activity tolerance)                Time: 8842-8771 OT Time Calculation (min): 31 min Charges:  OT General Charges $OT Visit: 1 Visit OT Evaluation $OT Eval Moderate Complexity: 1 Mod OT Treatments $Self Care/Home Management : 8-22 mins  Elouise Bott, OTR/L  Elouise JONELLE Bott 12/09/2023, 1:34 PM

## 2023-12-09 NOTE — Hospital Course (Signed)
 52M severe COPD on baseline 8 L oxygen  bounced back after being discharged 10/9 for treatment of multifocal pneumonia.    Consultants ***  Procedures/Events ***

## 2023-12-09 NOTE — Discharge Summary (Signed)
 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  Condition quickly improved and he was discharged home in good condition.  No evidence of pneumonia, no indication for further antibiotics.  He needs outpatient follow-up with oncology and ongoing care for COPD.  Consultants None   Procedures/Events None   End-stage COPD  Emphysema Chronic hypoxic respiratory failure on 8 L CTA chest no PE.  Extensive airspace disease both lungs not significantly changed. Continue outpatient follow-up.   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  Plan to follow-up with Dr. [name] as an outpatient.  Patient considering treatment.   Diabetes mellitus type 2 Hemoglobin A1c 6.7   CKD stage III ruled out   Class II obesity Body mass index is 36.34 kg/m.   Aortic atherosclerosis   Hospitalizations 10/2-10/9 for acute on chronic hypoxic respiratory failure, multifocal pneumonia, presumed progression of lung cancer 9/25 - 9/30 for acute on chronic respiratory failure, COPD exacerbation.  Seen by palliative care at that time: DNR, desired ongoing workup and treatment.  Seen by pulmonology. 3/19 - 3/27 for acute on chronic hypoxic respiratory failure.  Concern  for malingering behavior, see Dr. Kathaleen note. 3/7-3/18 for aspiration pneumonia hypoxic respiratory failure 2/28 - 3/7 progressive advanced COPD, superimposed lung malignancy, patient declined hospice services 2/1 - 2/24 for acute hypoxic respiratory failure, hemoptysis while on anticoagulation 1/17 - 2/1 for acute on chronic hypoxic respiratory failure, pneumonia, RSV infection, COPD exacerbation  Disposition: Home Diet recommendation:  Discharge Diet Orders (From admission, onward)     Start     Ordered   12/09/23 0000  Diet - low sodium heart healthy        12/09/23 1537           Regular diet DISCHARGE MEDICATION: Allergies as of 12/09/2023       Reactions   Beet [beta Vulgaris] Nausea And Vomiting   Demerol [meperidine] Nausea And Vomiting, Other (See Comments)   Made the patient violently sick   Nsaids Other (See Comments)   Contraindication due to CKD    Zocor [simvastatin] Nausea And Vomiting, Other (See Comments)   Made patient feel very jittery, also   Liver Nausea And Vomiting, Other (See Comments)   ANY Liver!!        Medication List     TAKE these medications    acetaminophen  500 MG tablet Commonly known as: TYLENOL  Take 1,000 mg by mouth every 6 (six) hours as needed for mild pain (pain score 1-3), moderate pain (pain score 4-6) or headache.   albuterol  (2.5 MG/3ML) 0.083% nebulizer solution Commonly known as: PROVENTIL  Take 2.5 mg by nebulization every 6 (six) hours as needed for wheezing or shortness of breath.   albuterol  108 (90 Base) MCG/ACT inhaler Commonly known as: VENTOLIN  HFA Inhale 2 puffs into the lungs every 4 (four) hours as needed for wheezing or shortness  of breath.   aspirin  EC 81 MG tablet Take 81 mg by mouth daily. Swallow whole.   bisacodyl  5 MG EC tablet Commonly known as: DULCOLAX Take 2 tablets (10 mg total) by mouth daily as needed for moderate constipation. What changed: when to take this   BLACK CURRANT SEED OIL  PO Take 2 capsules by mouth in the morning.   buPROPion  300 MG 24 hr tablet Commonly known as: WELLBUTRIN  XL Take 300 mg by mouth every morning.   busPIRone  15 MG tablet Commonly known as: BUSPAR  Take 15 mg by mouth in the morning.   CALCIUM  500 PO Take 500 mg by mouth in the morning.   Carboxymethylcellulose Sod PF 0.5 % Soln Place 1 drop into both eyes 4 (four) times daily as needed (dry eyes).   cholecalciferol  25 MCG (1000 UNIT) tablet Commonly known as: VITAMIN D3 Take 1,000 Units by mouth daily.   clobetasol  ointment 0.05 % Commonly known as: TEMOVATE  Apply 1 Application topically 2 (two) times daily. For ankles and feet   cyclobenzaprine  10 MG tablet Commonly known as: FLEXERIL  Take 20 mg by mouth at bedtime.   cycloSPORINE  0.05 % ophthalmic emulsion Commonly known as: RESTASIS  Place 1 drop into both eyes every 12 (twelve) hours.   diltiazem  120 MG 24 hr capsule Commonly known as: CARDIZEM  CD Take 1 capsule (120 mg total) by mouth daily.   docusate sodium  100 MG capsule Commonly known as: COLACE Take 100-200 mg by mouth 2 (two) times daily. Take two in am. Take one in pm.   DULoxetine  60 MG capsule Commonly known as: CYMBALTA  Take 60 mg by mouth 2 (two) times daily.   ferrous sulfate  325 (65 FE) MG tablet Take 325 mg by mouth daily with breakfast.   fluticasone  50 MCG/ACT nasal spray Commonly known as: FLONASE  Place 2 sprays into both nostrils in the morning.   gabapentin  100 MG capsule Commonly known as: NEURONTIN  Take 100 mg by mouth 3 (three) times daily.   GARLIC OIL PO Take 1 capsule by mouth in the morning.   Glucerna Liqd Take 237 mLs by mouth daily at 12 noon. Glucerna Therapeutic Shake chocolate- Drink 1 bottle by mouth once a day   guaiFENesin  600 MG 12 hr tablet Commonly known as: MUCINEX  Take 1 tablet (600 mg total) by mouth 2 (two) times daily as needed for cough or to loosen phlegm.   HYDROcodone -acetaminophen  10-325 MG  tablet Commonly known as: NORCO Take 1 tablet by mouth every 6 (six) hours as needed for moderate pain (pain score 4-6).   KRILL OIL PO Take 2 capsules by mouth daily.   lactulose  10 GM/15ML solution Commonly known as: CHRONULAC  Take 20 g by mouth 2 (two) times daily as needed for mild constipation.   lidocaine  5 % ointment Commonly known as: XYLOCAINE  Apply 1 Application topically 2 (two) times daily as needed (for pain- affected sites).   MAG-OXIDE PO Take 1 tablet by mouth daily.   magnesium  citrate Soln Take 0.5 Bottles by mouth daily as needed for severe constipation.   Melatonin 3 MG Caps Take 6 mg by mouth at bedtime.   multivitamin tablet Take 1 tablet by mouth daily with breakfast.   omeprazole  20 MG capsule Commonly known as: PRILOSEC Take 1 capsule (20 mg total) by mouth daily.   Oxcarbazepine  300 MG tablet Commonly known as: TRILEPTAL  Take 900 mg by mouth 2 (two) times daily.   OXYGEN  Inhale 6 L/min into the lungs continuous.   polyethylene  glycol powder 17 GM/SCOOP powder Commonly known as: GLYCOLAX /MIRALAX  Take 17 g by mouth in the morning.   predniSONE  10 MG tablet Commonly known as: DELTASONE  Take 3 tablets (30 mg total) by mouth daily for 3 days, THEN 2 tablets (20 mg total) daily for 3 days, THEN 1 tablet (10 mg total) daily for 3 days. Start taking on: December 07, 2023   senna 8.6 MG Tabs tablet Commonly known as: SENOKOT Take 2 tablets (17.2 mg total) by mouth 2 (two) times daily. What changed:  how much to take when to take this   tamsulosin  0.4 MG Caps capsule Commonly known as: FLOMAX  Take 0.8 mg by mouth in the morning.   Tiotropium Bromide -Olodaterol 2.5-2.5 MCG/ACT Aers Inhale 2 puffs into the lungs every morning.   traZODone  50 MG tablet Commonly known as: DESYREL  Take 75 mg by mouth at bedtime.   Turmeric Curcumin Caps Take 3 capsules by mouth See admin instructions. Turmeric/Bioperine/Ginger/Garlic- Take 3 capsules by mouth  once a day   VITAMIN B12 PO Take 1 tablet by mouth in the morning.        Follow-up Information     [facility] Schedule an appointment as soon as possible for a visit in 1 week(s).   Contact information: [address] [facility] [place] [address] [phone]                Feels better, walked in hall, ready to go home  Discharge Exam: Fredricka Weights   12/08/23 2211  Weight: 128.4 kg   Physical Exam Vitals reviewed.  Constitutional:      General: He is not in acute distress.    Appearance: He is not ill-appearing or toxic-appearing.  Cardiovascular:     Rate and Rhythm: Normal rate and regular rhythm.     Heart sounds: No murmur heard. Pulmonary:     Effort: Pulmonary effort is normal. No respiratory distress.     Breath sounds: No wheezing, rhonchi or rales.  Neurological:     Mental Status: He is alert.  Psychiatric:        Mood and Affect: Mood normal.        Behavior: Behavior normal.      Condition at discharge: good  The results of significant diagnostics from this hospitalization (including imaging, microbiology, ancillary and laboratory) are listed below for reference.   Imaging Studies: CT Angio Chest PE W and/or Wo Contrast Result Date: 12/08/2023 EXAM: CTA of the Chest with contrast for PE 12/08/2023 05:00:00 AM TECHNIQUE: CTA of the chest was performed without and with the administration of 100 mL of iohexol  (OMNIPAQUE ) 350 MG/ML injection. Multiplanar reformatted images are provided for review. MIP images are provided for review. Automated exposure control, iterative reconstruction, and/or weight based adjustment of the mA/kV was utilized to reduce the radiation dose to as low as reasonably achievable. COMPARISON: 11/30/2023 CLINICAL HISTORY: Syncope/presyncope, cerebrovascular cause suspected. SHOB from home, d/c'd 10/9 for same complaint. 85% on 9L at home, came up to 94% on non-rebreather hx of COPD and lung cancer.  Notes from chart; wbc's 17.8, GFR>60. FINDINGS: PULMONARY ARTERIES: Pulmonary arteries are adequately opacified for evaluation. No pulmonary embolism. Main pulmonary artery is normal in caliber. MEDIASTINUM: The heart and pericardium demonstrate no acute abnormality. Aortic atherosclerotic calcification. Coronary artery calcifications. LYMPH NODES: Prominent mediastinal and hilar lymph nodes likely reactive in the setting of multifocal pneumonia. No axillary lymphadenopathy. LUNGS AND PLEURA: Emphysema and diffuse bronchial wall thickening compatible with COPD. Central airways are  patent. Extensive airspace disease noted within both lungs which is most severe in the lower lobes. This is not significantly changed compared with the previous exam. Small right pleural effusion is unchanged. No pneumothorax. UPPER ABDOMEN: Unchanged complicated cyst arising from the lateral cortex of the right kidney measuring 2.2 cm and 41 hounsfield units. SOFT TISSUES AND BONES: Multiple remote healed left lateral rib fracture deformities. Partial ankylosis of the thoracic spine. IMPRESSION: 1. No evidence of pulmonary embolism. 2. Extensive airspace disease within both lungs, most severe in the lower lobes, not significantly changed compared with the previous exam. 3. Emphysema and diffuse bronchial wall thickening compatible with COPD. 4. Small right pleural effusion, unchanged. 5. Aortic atherosclerosis and coronary artery calcifications. Electronically signed by: Waddell Calk MD 12/08/2023 05:54 AM EDT RP Workstation: [id number]   CT Angio Chest PE W/Cm &/Or Wo Cm Result Date: 11/30/2023 EXAM: CTA CHEST 11/30/2023 05:22:30 AM TECHNIQUE: CTA of the chest was performed without and with the administration of 100 mL iohexol  (OMNIPAQUE ) 350 MG/ML injection. Multiplanar reformatted images are provided for review. MIP images are provided for review. Automated exposure control, iterative reconstruction, and/or weight based adjustment  of the mA/kV was utilized to reduce the radiation dose to as low as reasonably achievable. COMPARISON: CT angiogram of the chest dated 07/20/2023. CLINICAL HISTORY: Pulmonary embolism (PE) suspected, high prob. Increased SOB, recent admission for COPD exacerbation, wbc's 16.5, GFR>60, hx of lung ca 09/2020, radiation to left lung; Port cxr prior to ct; 100 omni 350. FINDINGS: PULMONARY ARTERIES: Pulmonary arteries are adequately opacified for evaluation. No acute pulmonary embolus. Main pulmonary artery is normal in caliber. MEDIASTINUM: There is moderate calcific coronary artery disease. The pericardium demonstrates no acute abnormality. The thoracic aorta also demonstrates moderate calcific atheromatous disease. LYMPH NODES: There are numerous shotty mediastinal nodes, as before. No hilar or axillary lymphadenopathy. LUNGS AND PLEURA: There is moderate-to-severe central lobular emphysema present. There is extensive patchy opacification/consolidation of the lower lobes bilaterally and of the lingula. There are also patchy neuro bronchovascular nodular opacities within the right middle lobe. No evidence of pleural effusion or pneumothorax. UPPER ABDOMEN: Simple-appearing exophytic cysts arising from the kidneys bilaterally. The abdominal aorta demonstrates moderate calcific atheromatous disease. SOFT TISSUES AND BONES: No acute bone or soft tissue abnormality. IMPRESSION: 1. No evidence of pulmonary embolism. 2. Extensive multifocal pneumonia involving the lower lobes bilaterally, lingula, and right middle lobe. 3. Moderate-to-severe centrilobular emphysema. Electronically signed by: Evalene Coho MD 11/30/2023 05:50 AM EDT RP Workstation: [id number]   DG Chest Port 1 View Result Date: 11/30/2023 EXAM: 1 VIEW(S) XRAY OF THE CHEST 11/30/2023 03:07:00 AM COMPARISON: 11/23/2023 CLINICAL HISTORY: sob. Shortness of breath FINDINGS: LUNGS AND PLEURA: Patchy airspace opacities throughout left lung. Right lower lung  zone airspace opacity. Small bilateral pleural effusions. No pulmonary edema. No pneumothorax. HEART AND MEDIASTINUM: No acute abnormality of the cardiac and mediastinal silhouettes. BONES AND SOFT TISSUES: No acute osseous abnormality. IMPRESSION: 1. Patchy airspace opacities throughout the left lung and in the right lower lung similar to 11/23/2023. 2. Small bilateral pleural effusions. Electronically signed by: Norman Gatlin MD 11/30/2023 03:19 AM EDT RP Workstation: [id number]   DG Chest Port 1 View Result Date: 11/23/2023 EXAM: 1 VIEW(S) XRAY OF THE CHEST 11/23/2023 11:24:06 PM COMPARISON: CTA chest dated 07/20/2023. CLINICAL HISTORY: shortness of breath. Per chart - c/o SOB for the past 3-4 days. Per EMS, pts initial oxygen  saturation was 88% on his baseline 8L [place]. Pt was given one Duoneb treatment with some relief.  On arrival, labored breathing noted with retractions and pt is unable to speak in full ; sentences with out taking a breath. Pt denies any recent sick contacts, chest pain, fevers, chills, n/v/d. Dr. Cottie at bedside and respiratory notified FINDINGS: LUNGS AND PLEURA: Stable multifocal opacities are present in the left mid/lower lung and right lower lung. While this has previously been considered suspicious for pneumonia, given the chronic appearance, multifocal semi-invasive peripheral adenocarcinoma (formally, bronchoalveolar cell carcinoma) should be considered. Associated small bilateral pleural effusions, chronic. No pulmonary edema. No pneumothorax. HEART AND MEDIASTINUM: No acute abnormality of the cardiac and mediastinal silhouettes. BONES AND SOFT TISSUES: No acute osseous abnormality. IMPRESSION: 1. Stable multifocal opacities in the left mid/lower lung and right lower lung. Given chronic appearance, multifocal semi-invasive peripheral adenocarcinoma should be considered. 2. Small bilateral pleural effusions, chronic. Electronically signed by: Pinkie Pebbles MD 11/23/2023 11:29 PM  EDT RP Workstation: [id number]    Microbiology: Results for orders placed or performed during the hospital encounter of 12/08/23  Culture, blood (Routine x 2)     Status: None (Preliminary result)   Collection Time: 12/08/23  3:05 AM   Specimen: BLOOD  Result Value Ref Range Status   Specimen Description   Final    BLOOD RIGHT ANTECUBITAL Performed at [facility], [address] [place], [place] [address]    Special Requests   Final    Blood Culture adequate volume BOTTLES DRAWN AEROBIC AND ANAEROBIC Performed at [facility], [address], [place], [place] [address]    Culture   Final    NO GROWTH 1 DAY Performed at [facility], [address] [place], [place] [address]    Report Status PENDING  Incomplete  Resp panel by RT-PCR (RSV, Flu A&B, Covid) Anterior Nasal Swab     Status: None   Collection Time: 12/08/23  3:14 AM   Specimen: Anterior Nasal Swab  Result Value Ref Range Status   SARS Coronavirus 2 by RT PCR NEGATIVE NEGATIVE Final    Comment: (NOTE) SARS-CoV-2 target nucleic acids are NOT DETECTED.  The SARS-CoV-2 RNA is generally detectable in upper respiratory specimens during the acute phase of infection. The lowest concentration of SARS-CoV-2 viral copies this assay can detect is 138 copies/mL. A negative result does not preclude SARS-Cov-2 infection and should not be used as the sole basis for treatment or other patient management decisions. A negative result may occur with  improper specimen collection/handling, submission of specimen other than nasopharyngeal swab, presence of viral mutation(s) within the areas targeted by this assay, and inadequate number of viral copies(<138 copies/mL). A negative result must be combined with clinical observations, patient history, and epidemiological information. The expected result is Negative.  Fact Sheet for Patients:  BloggerCourse.com  Fact  Sheet for Healthcare Providers:  SeriousBroker.it  This test is no t yet approved or cleared by the [place]  FDA and  has been authorized for detection and/or diagnosis of SARS-CoV-2 by FDA under an Emergency Use Authorization (EUA). This EUA will remain  in effect (meaning this test can be used) for the duration of the COVID-19 declaration under Section 564(b)(1) of the Act, 21 U.S.C.section 360bbb-3(b)(1), unless the authorization is terminated  or revoked sooner.       Influenza A by PCR NEGATIVE NEGATIVE Final   Influenza B by PCR NEGATIVE NEGATIVE Final    Comment: (NOTE) The Xpert Xpress SARS-CoV-2/FLU/RSV plus assay is intended as an aid in the diagnosis of influenza  from Nasopharyngeal swab specimens and should not be used as a sole basis for treatment. Nasal washings and aspirates are unacceptable for Xpert Xpress SARS-CoV-2/FLU/RSV testing.  Fact Sheet for Patients: BloggerCourse.com  Fact Sheet for Healthcare Providers: SeriousBroker.it  This test is not yet approved or cleared by the [place]  FDA and has been authorized for detection and/or diagnosis of SARS-CoV-2 by FDA under an Emergency Use Authorization (EUA). This EUA will remain in effect (meaning this test can be used) for the duration of the COVID-19 declaration under Section 564(b)(1) of the Act, 21 U.S.C. section 360bbb-3(b)(1), unless the authorization is terminated or revoked.     Resp Syncytial Virus by PCR NEGATIVE NEGATIVE Final    Comment: (NOTE) Fact Sheet for Patients: BloggerCourse.com  Fact Sheet for Healthcare Providers: SeriousBroker.it  This test is not yet approved or cleared by the [place]  FDA and has been authorized for detection and/or diagnosis of SARS-CoV-2 by FDA under an Emergency Use Authorization (EUA). This EUA will remain in effect  (meaning this test can be used) for the duration of the COVID-19 declaration under Section 564(b)(1) of the Act, 21 U.S.C. section 360bbb-3(b)(1), unless the authorization is terminated or revoked.  Performed at [facility], [address] [place], [place] [address]   Culture, blood (Routine x 2)     Status: None (Preliminary result)   Collection Time: 12/08/23  8:11 PM   Specimen: BLOOD RIGHT HAND  Result Value Ref Range Status   Specimen Description   Final    BLOOD RIGHT HAND Performed at [facility], [address], [place], [place] [address]    Special Requests   Final    BOTTLES DRAWN AEROBIC AND ANAEROBIC Blood Culture adequate volume Performed at [facility], [address], [place], [place] [address]    Culture   Final    NO GROWTH < 12 HOURS Performed at [facility], [address], [place], [place] [address]    Report Status PENDING  Incomplete    Labs: CBC: Recent Labs  Lab 12/03/23 0533 12/04/23 0522 12/08/23 0305  WBC 12.4* 13.4* 17.8*  NEUTROABS  --   --  13.0*  HGB 12.7* 13.9 14.6  HCT 41.3 43.6 46.3  MCV 92.0 90.8 91.3  PLT 320 322 370   Basic Metabolic Panel: Recent Labs  Lab 12/03/23 0533 12/04/23 0522 12/07/23 0538 12/07/23 0828 12/08/23 0305  NA 136 134*  --  136 135  K 3.9 3.7  --  4.8 4.3  CL 100 101  --  97* 97*  CO2 26 24  --  32 27  GLUCOSE 118* 113*  --  106* 130*  BUN 15 16  --  15 19  CREATININE 0.85 0.85 0.94 0.94 0.99  CALCIUM  8.8* 8.9  --  9.3 9.8   Liver Function Tests: Recent Labs  Lab 12/08/23 0305  AST 20  ALT 22  ALKPHOS 88  BILITOT 0.2  PROT 7.4  ALBUMIN 4.2   CBG: Recent Labs  Lab 12/08/23 1311 12/08/23 1701 12/08/23 2137 12/09/23 0726 12/09/23 1140  GLUCAP 141* 182* 129* 182* 157*    Discharge time spent: greater than 30 minutes.  Signed: Toribio Door, MD [facility] 12/09/2023

## 2023-12-09 NOTE — Progress Notes (Signed)
   12/09/23 0051  BiPAP/CPAP/SIPAP  $ Non-Invasive Home Ventilator  Initial  Mask Type Full face mask  Dentures removed? Not applicable  Mask Size Medium  EPAP 9 cmH2O  Patient Home Machine No  Patient Home Mask Yes  Patient Home Tubing No  Auto Titrate No  CPAP/SIPAP surface wiped down Yes  Device Plugged into RED Power Outlet Yes  BiPAP/CPAP /SiPAP Vitals  Pulse Rate 87  Resp 20  SpO2 91 %

## 2023-12-09 NOTE — Evaluation (Signed)
 Physical Therapy One Time Evaluation Patient Details Name: Mario [name] MRN: [id number] DOB: 10-31-1950 Today's Date: 12/09/2023  History of Present Illness  73 y.o. male with medical history significant for ankylosing spondylitis, chronic hypoxic respiratory failure due to COPD on 8 L nasal cannula oxygen  at baseline, depression with prior history of suicide attempt, hypertension, OSA, morbid obesity, type 2 diabetes, CKD stage IIIa who was recently hospitalized for acute on chronic hypoxic respiratory failure in the setting of multifocal pneumonia and discharged home on 10/9 after completing antibiotic therapy and is now being readmitted to the hospital on 10/10 with worsening dyspnea and shortness of breath.  Clinical Impression  Patient evaluated by Physical Therapy with no further acute PT needs identified. All education has been completed and the patient has no further questions.  Pt reports he has rollator at home if needed however typically prefers not to use assistive device.  Pt only able to ambulate short distances (room to room) without needing rest breaks due to dyspnea at baseline.  Pt currently on 8L which is his baseline, and he appears knowledgeable about his O2 use and monitoring his SpO2.  Pt reports he is familiar with HHPT and would like this upon d/c.  Pt anticipates d/c home today.  PT is signing off. Thank you for this referral.         If plan is discharge home, recommend the following: A little help with bathing/dressing/bathroom;Assistance with cooking/housework;Assist for transportation;Help with stairs or ramp for entrance   Can travel by private vehicle        Equipment Recommendations None recommended by PT  Recommendations for Other Services       Functional Status Assessment Patient has had a recent decline in their functional status and demonstrates the ability to make significant improvements in function in a reasonable and predictable amount of time.      Precautions / Restrictions Precautions Recall of Precautions/Restrictions: Intact Precaution/Restrictions Comments: pt has his own pulse oximeter, baseline 8L at home      Mobility  Bed Mobility               General bed mobility comments: pt sitting EOB on arrival and departure    Transfers Overall transfer level: Modified independent Equipment used: None Transfers: Sit to/from Stand             General transfer comment: good safety awareness and mindful of lines and monitoring SPO2    Ambulation/Gait Ambulation/Gait assistance: Supervision, Modified independent (Device/Increase time) Gait Distance (Feet): 40 Feet Assistive device: None Gait Pattern/deviations: Step-through pattern, Decreased stride length       General Gait Details: slow but steady, only able to perform short distances at baseline, 40 ft is a max distance for him at one time; Spo2 monitored on 8L HFNC and mostly 89-92% during ambulation however dropped to 80% with sitting and required a few minutes to recover back to 94% (pt's baseline 8L maintained throughout session)  Stairs            Wheelchair Mobility     Tilt Bed    Modified Rankin (Stroke Patients Only)       Balance Overall balance assessment: No apparent balance deficits (not formally assessed)                                           Pertinent Vitals/Pain Pain Assessment  Pain Assessment: No/denies pain    Home Living Family/patient expects to be discharged to:: Private residence Living Arrangements: Non-relatives/Friends Available Help at Discharge: Available 24 hours/day Type of Home: House Home Access: Level entry     Alternate Level Stairs-Number of Steps: Split Level : enter on main level with kitchen/living  up 5 steps to bedroom and bathroom Home Layout: Two level Home Equipment: Cane - single point;Shower Counsellor (2 wheels);Rollator (4 wheels);Other (comment);Hand  held shower head;Adaptive equipment Additional Comments: housemate lives across the hall helps too; [place] aide comes 5 hours/day 3 days/week helps with cleaning; fleeta for transportation (though recently couldn't walk to the [place]). On 8L O2 at home    Prior Function Prior Level of Function : Needs assist             Mobility Comments: Ambulates in home; gets SOB easily, seated rest breaks to recover; walks with AD; no falls in past 6 months. On 8L O2 at home. ADLs Comments: sits to shower, showering is very taxing; aide helps with dressing, pt showers independently with Aide nearby for safety. Housemate does havey housework and Aide does light housework. Aide does meal prep and laundry but cannot provide transportation.     Extremity/Trunk Assessment        Lower Extremity Assessment Lower Extremity Assessment: Overall WFL for tasks assessed (has had issues with numbness in bil feet for a few weeks prior to last admission)       Communication   Communication Communication: No apparent difficulties    Cognition Arousal: Alert Behavior During Therapy: WFL for tasks assessed/performed   PT - Cognitive impairments: No apparent impairments                         Following commands: Intact       Cueing       General Comments      Exercises     Assessment/Plan    PT Assessment All further PT needs can be met in the next venue of care  PT Problem List Decreased activity tolerance;Decreased mobility       PT Treatment Interventions      PT Goals (Current goals can be found in the Care Plan section)  Acute Rehab PT Goals PT Goal Formulation: All assessment and education complete, DC therapy    Frequency       Co-evaluation               AM-PAC PT 6 Clicks Mobility  Outcome Measure Help needed turning from your back to your side while in a flat bed without using bedrails?: None Help needed moving from lying on your back to sitting on the side of  a flat bed without using bedrails?: None Help needed moving to and from a bed to a chair (including a wheelchair)?: None Help needed standing up from a chair using your arms (e.g., wheelchair or bedside chair)?: None Help needed to walk in hospital room?: A Little Help needed climbing 3-5 steps with a railing? : A Little 6 Click Score: 22    End of Session Equipment Utilized During Treatment: Oxygen  Activity Tolerance: Patient tolerated treatment well Patient left: in bed;with call bell/phone within reach Nurse Communication: Mobility status PT Visit Diagnosis: Difficulty in walking, not elsewhere classified (R26.2)    Time: 8962-8946 PT Time Calculation (min) (ACUTE ONLY): 16 min   Charges:   PT Evaluation $PT Eval Low Complexity: 1 Low   PT  General Charges $$ ACUTE PT VISIT: 1 Visit        Tari PT, DPT Physical Therapist Acute Rehabilitation Services Office: [phone]   Tari CROME Payson 12/09/2023, 12:55 PM

## 2023-12-12 ENCOUNTER — Encounter (HOSPITAL_COMMUNITY): Payer: Self-pay | Admitting: Emergency Medicine

## 2023-12-12 ENCOUNTER — Emergency Department (HOSPITAL_COMMUNITY)

## 2023-12-12 ENCOUNTER — Other Ambulatory Visit: Payer: Self-pay

## 2023-12-12 ENCOUNTER — Inpatient Hospital Stay (HOSPITAL_COMMUNITY)
Admission: EM | Admit: 2023-12-12 | Discharge: 2023-12-15 | DRG: 189 | Disposition: A | Discharge status: Skilled Nursing Facility | Attending: Internal Medicine | Admitting: Internal Medicine

## 2023-12-12 DIAGNOSIS — Z86711 Personal history of pulmonary embolism: Secondary | ICD-10-CM

## 2023-12-12 DIAGNOSIS — Z923 Personal history of irradiation: Secondary | ICD-10-CM

## 2023-12-12 DIAGNOSIS — Z515 Encounter for palliative care: Secondary | ICD-10-CM

## 2023-12-12 DIAGNOSIS — Z91199 Patient's noncompliance with other medical treatment and regimen due to unspecified reason: Secondary | ICD-10-CM

## 2023-12-12 DIAGNOSIS — J441 Chronic obstructive pulmonary disease with (acute) exacerbation: Secondary | ICD-10-CM | POA: Diagnosis not present

## 2023-12-12 DIAGNOSIS — Z87891 Personal history of nicotine dependence: Secondary | ICD-10-CM

## 2023-12-12 DIAGNOSIS — G8929 Other chronic pain: Secondary | ICD-10-CM | POA: Diagnosis present

## 2023-12-12 DIAGNOSIS — G894 Chronic pain syndrome: Secondary | ICD-10-CM | POA: Diagnosis present

## 2023-12-12 DIAGNOSIS — Z9151 Personal history of suicidal behavior: Secondary | ICD-10-CM

## 2023-12-12 DIAGNOSIS — K5909 Other constipation: Secondary | ICD-10-CM | POA: Diagnosis present

## 2023-12-12 DIAGNOSIS — Z7982 Long term (current) use of aspirin: Secondary | ICD-10-CM

## 2023-12-12 DIAGNOSIS — I10 Essential (primary) hypertension: Secondary | ICD-10-CM | POA: Diagnosis present

## 2023-12-12 DIAGNOSIS — F39 Unspecified mood [affective] disorder: Secondary | ICD-10-CM | POA: Diagnosis present

## 2023-12-12 DIAGNOSIS — C3490 Malignant neoplasm of unspecified part of unspecified bronchus or lung: Secondary | ICD-10-CM | POA: Diagnosis present

## 2023-12-12 DIAGNOSIS — I252 Old myocardial infarction: Secondary | ICD-10-CM

## 2023-12-12 DIAGNOSIS — E66812 Obesity, class 2: Secondary | ICD-10-CM | POA: Diagnosis present

## 2023-12-12 DIAGNOSIS — N400 Enlarged prostate without lower urinary tract symptoms: Secondary | ICD-10-CM | POA: Diagnosis present

## 2023-12-12 DIAGNOSIS — Z85828 Personal history of other malignant neoplasm of skin: Secondary | ICD-10-CM

## 2023-12-12 DIAGNOSIS — Z66 Do not resuscitate: Secondary | ICD-10-CM | POA: Diagnosis present

## 2023-12-12 DIAGNOSIS — Z888 Allergy status to other drugs, medicaments and biological substances status: Secondary | ICD-10-CM

## 2023-12-12 DIAGNOSIS — J9621 Acute and chronic respiratory failure with hypoxia: Secondary | ICD-10-CM | POA: Diagnosis not present

## 2023-12-12 DIAGNOSIS — Z9181 History of falling: Secondary | ICD-10-CM

## 2023-12-12 DIAGNOSIS — K219 Gastro-esophageal reflux disease without esophagitis: Secondary | ICD-10-CM | POA: Diagnosis present

## 2023-12-12 DIAGNOSIS — T380X5A Adverse effect of glucocorticoids and synthetic analogues, initial encounter: Secondary | ICD-10-CM | POA: Diagnosis present

## 2023-12-12 DIAGNOSIS — Z6836 Body mass index (BMI) 36.0-36.9, adult: Secondary | ICD-10-CM

## 2023-12-12 DIAGNOSIS — E119 Type 2 diabetes mellitus without complications: Secondary | ICD-10-CM

## 2023-12-12 DIAGNOSIS — Z1152 Encounter for screening for COVID-19: Secondary | ICD-10-CM

## 2023-12-12 DIAGNOSIS — Z9981 Dependence on supplemental oxygen: Secondary | ICD-10-CM

## 2023-12-12 DIAGNOSIS — Z886 Allergy status to analgesic agent status: Secondary | ICD-10-CM

## 2023-12-12 DIAGNOSIS — E0965 Drug or chemical induced diabetes mellitus with hyperglycemia: Secondary | ICD-10-CM | POA: Diagnosis present

## 2023-12-12 DIAGNOSIS — F319 Bipolar disorder, unspecified: Secondary | ICD-10-CM | POA: Diagnosis present

## 2023-12-12 LAB — TROPONIN T, HIGH SENSITIVITY
Troponin T High Sensitivity: 24 ng/L — ABNORMAL HIGH (ref 0–19)
Troponin T High Sensitivity: 17 ng/L (ref 0–19)

## 2023-12-12 LAB — BASIC METABOLIC PANEL WITH GFR
Anion gap: 12 (ref 5–15)
BUN: 18 mg/dL (ref 8–23)
CO2: 25 mmol/L (ref 22–32)
Calcium: 8.7 mg/dL — ABNORMAL LOW (ref 8.9–10.3)
Chloride: 99 mmol/L (ref 98–111)
Creatinine, Ser: 0.89 mg/dL (ref 0.61–1.24)
GFR, Estimated: >60 mL/min (ref >60)
Glucose, Bld: 276 mg/dL — ABNORMAL HIGH (ref 70–99)
Potassium: 4.7 mmol/L (ref 3.5–5.1)
Sodium: 136 mmol/L (ref 135–145)

## 2023-12-12 LAB — CBC
HCT: 44.2 % (ref 39.0–52.0)
Hemoglobin: 13.9 g/dL (ref 13.0–17.0)
MCH: 28.9 pg (ref 26.0–34.0)
MCHC: 31.4 g/dL (ref 30.0–36.0)
MCV: 91.9 fL (ref 80.0–100.0)
Platelets: 357 K/uL (ref 150–400)
RBC: 4.81 MIL/uL (ref 4.22–5.81)
RDW: 15.1 % (ref 11.5–15.5)
WBC: 16.2 K/uL — ABNORMAL HIGH (ref 4.0–10.5)
nRBC: 0 % (ref 0.0–0.2)

## 2023-12-12 LAB — CBG MONITORING, ED: Glucose-Capillary: 275 mg/dL — ABNORMAL HIGH (ref 70–99)

## 2023-12-12 LAB — PRO BRAIN NATRIURETIC PEPTIDE: Pro Brain Natriuretic Peptide: <50 pg/mL (ref <300.0)

## 2023-12-12 MED ORDER — METHYLPREDNISOLONE SODIUM SUCC 125 MG IJ SOLR
125.0000 mg | Freq: Once | INTRAMUSCULAR | Status: AC
Start: 2023-12-12 — End: 2023-12-12
  Administered 2023-12-12: 125 mg via INTRAVENOUS
  Filled 2023-12-12: qty 2

## 2023-12-12 NOTE — ED Provider Notes (Signed)
 [place] [facility] AT [facility] Provider Note   CSN: [id number] Arrival date & time: 12/12/23  1906     Patient presents with: Shortness of Breath   Mario [name] is a 73 y.o. male.   Patient is a 73 year old male with a history of COPD and metastatic adenocarcinoma of the lung status post radiation treatment on chronic oxygen  at 8 L/min who presents with shortness of breath.  He has had multiple recent hospitalizations.  He said that he has had some worsening shortness of breath since yesterday.  He is normally on oxygen  8 L but he says he turns it up sometimes and he said recently today he was unable to get his oxygenation over the upper 80s.  He denies any chest pain.  He has a nonproductive cough.  No fevers.  No increased leg swelling.  He has recently been admitted multiple times for similar symptoms.  His x-ray commonly is read as multifocal pneumonia but on prior notes, pulmonology has felt that it was not consistent with infection given the chronic nature of it and it is more likely scarring from his prior radiation and progression of his metastatic disease.          Prior to Admission medications   Medication Sig Start Date End Date Taking? Authorizing Provider  acetaminophen  (TYLENOL ) 500 MG tablet Take 1,000 mg by mouth every 6 (six) hours as needed for mild pain (pain score 1-3), moderate pain (pain score 4-6) or headache.    [provider]  albuterol  (PROVENTIL ) (2.5 MG/3ML) 0.083% nebulizer solution Take 2.5 mg by nebulization every 6 (six) hours as needed for wheezing or shortness of breath.    [provider]  albuterol  (VENTOLIN  HFA) 108 (90 Base) MCG/ACT inhaler Inhale 2 puffs into the lungs every 4 (four) hours as needed for wheezing or shortness of breath. 11/04/21   Cheryle Page, MD  aspirin  EC 81 MG tablet Take 81 mg by mouth daily. Swallow whole.    [provider]  bisacodyl  (DULCOLAX) 5 MG EC tablet  Take 2 tablets (10 mg total) by mouth daily as needed for moderate constipation. Patient taking differently: Take 10 mg by mouth daily at 12 noon. 01/26/23   Armenta Canning, MD  BLACK CURRANT SEED OIL PO Take 2 capsules by mouth in the morning.    [provider]  buPROPion  (WELLBUTRIN  XL) 300 MG 24 hr tablet Take 300 mg by mouth every morning. 02/05/20   [provider]  busPIRone  (BUSPAR ) 15 MG tablet Take 15 mg by mouth in the morning.    [provider]  Calcium  Carbonate (CALCIUM  500 PO) Take 500 mg by mouth in the morning.    [provider]  Carboxymethylcellulose Sod PF 0.5 % SOLN Place 1 drop into both eyes 4 (four) times daily as needed (dry eyes).    [provider]  cholecalciferol  (VITAMIN D3) 25 MCG (1000 UNIT) tablet Take 1,000 Units by mouth daily.    [provider]  clobetasol  ointment (TEMOVATE ) 0.05 % Apply 1 Application topically 2 (two) times daily. For ankles and feet    [provider]  Cyanocobalamin (VITAMIN B12 PO) Take 1 tablet by mouth in the morning.    [provider]  cyclobenzaprine  (FLEXERIL ) 10 MG tablet Take 20 mg by mouth at bedtime.    [provider]  cycloSPORINE  (RESTASIS ) 0.05 % ophthalmic emulsion Place 1 drop into both eyes every 12 (twelve) hours.  [provider]  diltiazem  (CARDIZEM  CD) 120 MG 24 hr capsule Take 1 capsule (120 mg total) by mouth daily. 11/28/23 02/26/24  Barbarann Nest, MD  docusate sodium  (COLACE) 100 MG capsule Take 100-200 mg by mouth 2 (two) times daily. Take two in am. Take one in pm.    [provider]  DULoxetine  (CYMBALTA ) 60 MG capsule Take 60 mg by mouth 2 (two) times daily.    [provider]  ferrous sulfate  325 (65 FE) MG tablet Take 325 mg by mouth daily with breakfast.    [provider]  fluticasone  (FLONASE ) 50 MCG/ACT nasal spray Place 2 sprays into both nostrils in the morning.    [provider]  gabapentin  (NEURONTIN ) 100 MG capsule Take 100 mg by mouth 3 (three) times daily.    [provider]  GARLIC OIL PO Take 1 capsule by mouth in the morning.    [provider]  Glucerna (GLUCERNA) LIQD Take 237 mLs by mouth daily at 12 noon. Glucerna Therapeutic Shake chocolate- Drink 1 bottle by mouth once a day    [provider]  guaiFENesin  (MUCINEX ) 600 MG 12 hr tablet Take 1 tablet (600 mg total) by mouth 2 (two) times daily as needed for cough or to loosen phlegm. 11/28/23   Barbarann Nest, MD  HYDROcodone -acetaminophen  (NORCO) 10-325 MG tablet Take 1 tablet by mouth every 6 (six) hours as needed for moderate pain (pain score 4-6).    [provider]  KRILL OIL PO Take 2 capsules by mouth daily.    [provider]  lactulose  (CHRONULAC ) 10 GM/15ML solution Take 20 g by mouth 2 (two) times daily as needed for mild constipation.    [provider]  lidocaine  (XYLOCAINE ) 5 % ointment Apply 1 Application topically 2 (two) times daily as needed (for pain- affected sites).    [provider]  magnesium  citrate SOLN Take 0.5 Bottles by mouth daily as needed for severe constipation.    [provider]  Magnesium  Oxide (MAG-OXIDE PO) Take 1 tablet by mouth daily.    [provider]  Melatonin 3 MG CAPS Take 6 mg by mouth at bedtime.    [provider]  Misc Natural Products (TURMERIC CURCUMIN) CAPS Take 3 capsules by mouth See admin instructions. Turmeric/Bioperine/Ginger/Garlic- Take 3 capsules by mouth once a day    [provider]  Multiple Vitamin (MULTIVITAMIN) tablet Take 1 tablet by mouth daily with breakfast.    [provider]  omeprazole  (PRILOSEC) 20 MG capsule Take 1 capsule (20 mg total) by mouth daily. 01/26/23   Armenta Canning, MD  Oxcarbazepine  (TRILEPTAL ) 300 MG tablet Take 900 mg by mouth 2 (two) times daily.    [provider]  OXYGEN  Inhale 6 L/min into the lungs  continuous.    [provider]  polyethylene glycol powder (GLYCOLAX /MIRALAX ) 17 GM/SCOOP powder Take 17 g by mouth in the morning.    [provider]  predniSONE  (DELTASONE ) 10 MG tablet Take 3 tablets (30 mg total) by mouth daily for 3 days, THEN 2 tablets (20 mg total) daily for 3 days, THEN 1 tablet (10 mg total) daily for 3 days. 12/07/23 12/16/23  Pahwani, Ravi, MD  senna (SENOKOT) 8.6 MG TABS tablet Take 2 tablets (17.2 mg total) by mouth 2 (two) times daily. Patient taking differently: Take 1 tablet by mouth daily. 05/23/23   Will Almarie MATSU, MD  tamsulosin  (FLOMAX ) 0.4 MG CAPS capsule Take 0.8 mg by mouth  in the morning.    [provider]  Tiotropium Bromide -Olodaterol 2.5-2.5 MCG/ACT AERS Inhale 2 puffs into the lungs every morning.    [provider]  traZODone  (DESYREL ) 50 MG tablet Take 75 mg by mouth at bedtime.    [provider]    Allergies: Beet [beta vulgaris], Demerol [meperidine], Nsaids, Zocor [simvastatin], and Liver    Review of Systems  Constitutional:  Positive for fatigue. Negative for chills, diaphoresis and fever.  HENT:  Negative for congestion, rhinorrhea and sneezing.   Eyes: Negative.   Respiratory:  Positive for cough and shortness of breath. Negative for chest tightness.   Cardiovascular:  Positive for leg swelling (At baseline). Negative for chest pain.  Gastrointestinal:  Negative for abdominal pain, diarrhea, nausea and vomiting.  Genitourinary:  Negative for difficulty urinating, flank pain and frequency.  Musculoskeletal:  Negative for arthralgias and back pain.  Skin:  Negative for rash.  Neurological:  Negative for dizziness, speech difficulty, weakness, numbness and headaches.    Updated Vital Signs BP (!) 149/82   Pulse 95   Temp (!) 97.5 F (36.4 C) (Oral)   Resp 19   Ht 6' 2 (1.88 m)   Wt 128.4 kg   SpO2 98%   BMI 36.34 kg/m   Physical Exam Constitutional:      Appearance: He is  well-developed. He is ill-appearing.  HENT:     Head: Normocephalic and atraumatic.  Eyes:     Pupils: Pupils are equal, round, and reactive to light.  Cardiovascular:     Rate and Rhythm: Normal rate and regular rhythm.     Heart sounds: Normal heart sounds.  Pulmonary:     Effort: Pulmonary effort is normal. Tachypnea (Mild tachypnea) present. No respiratory distress.     Breath sounds: Wheezing (Few scattered wheezes) present. No rales.  Chest:     Chest wall: No tenderness.  Abdominal:     General: Bowel sounds are normal.     Palpations: Abdomen is soft.     Tenderness: There is no abdominal tenderness. There is no guarding or rebound.  Musculoskeletal:        General: Normal range of motion.     Cervical back: Normal range of motion and neck supple.     Comments: 1+ edema to lower extremities bilaterally  Lymphadenopathy:     Cervical: No cervical adenopathy.  Skin:    General: Skin is warm and dry.     Findings: No rash.  Neurological:     Mental Status: He is alert and oriented to person, place, and time.     (all labs ordered are listed, but only abnormal results are displayed) Labs Reviewed  BASIC METABOLIC PANEL WITH GFR - Abnormal; Notable for the following components:      Result Value   Glucose, Bld 276 (*)    Calcium  8.7 (*)    All other components within normal limits  CBC - Abnormal; Notable for the following components:   WBC 16.2 (*)    All other components within normal limits  CBG MONITORING, ED - Abnormal; Notable for the following components:   Glucose-Capillary 275 (*)    All other components within normal limits  TROPONIN T, HIGH SENSITIVITY - Abnormal; Notable for the following components:   Troponin T High Sensitivity 24 (*)    All other components within normal limits  PRO BRAIN NATRIURETIC PEPTIDE  TROPONIN T, HIGH SENSITIVITY    EKG: EKG Interpretation Date/Time:  Tuesday December 12 2023 20:04:14 EDT Ventricular Rate:  108 PR  Interval:  184 QRS Duration:  110 QT Interval:  329 QTC Calculation: 441 R Axis:   -28  Text Interpretation: Sinus tachycardia Borderline left axis deviation Low voltage, precordial leads since last tracing no significant change Confirmed by Lenor Hollering [phone]) on 12/12/2023 8:07:15 PM  Radiology: [facility] Chest 2 View Result Date: 12/12/2023 EXAM: 2 VIEW(S) XRAY OF THE CHEST 12/12/2023 08:15:00 PM COMPARISON: Chest x-ray 11/30/2023 and chest CT 12/08/2023. CLINICAL HISTORY: Shortness of breath. Per chart - Patient presents from home due to shortness of breath which began around 1100 today and has progressively gotten worse since. When fire arrived, they placed him on 8 L and SPO2 was 86%. EMS placed him on a non re breather followed by a duo ; neb. HX: Stage 4 Lung cancer. FINDINGS: LUNGS AND PLEURA: Bilateral left mid and lower lung consolidation and right lower lung consolidation have not significantly changed. No pulmonary edema. No significant pleural effusion or pneumothorax. HEART AND MEDIASTINUM: The heart is mildly enlarged, unchanged. BONES AND SOFT TISSUES: No acute osseous abnormality. IMPRESSION: 1. Bilateral left mid and lower lung consolidation and right lower lung consolidation, not significantly changed. 2. Mild cardiomegaly, unchanged. Electronically signed by: Greig Pique MD 12/12/2023 08:23 PM EDT RP Workstation: [id number]     Procedures   Medications Ordered in the ED  methylPREDNISolone  sodium succinate (SOLU-MEDROL ) 125 mg/2 mL injection 125 mg (125 mg Intravenous Given 12/12/23 2303)                                    Medical Decision Making Amount and/or Complexity of Data Reviewed Labs: ordered. Radiology: ordered.  Risk Prescription drug management. Decision regarding hospitalization.   This patient presents to the ED for concern of shortness of breath, this involves an extensive number of treatment options, and is a complaint that carries with it a high  risk of complications and morbidity.  I considered the following differential and admission for this acute, potentially life threatening condition.  The differential diagnosis includes COPD exacerbation, pneumonia, PE, pleural effusion, ACS, pneumothorax  MDM:    Patient is a 73 year old who presents with shortness of breath and cough.  He has had similar presentations in the past.  His lungs have some rhonchi and a little bit of wheezing.  He got a DuoNeb by EMS.  Chest x-ray shows patchy infiltrates which are unchanged from his prior imaging studies.  Prior notes have indicated that pulmonology does not feel that this represents acute infection.  He has had 2 CTAs of his chest that have not shown any evidence of PEs already this month so I do not feel that we need to repeat that at this point.  Currently he has some increased work of breathing when he is talking but at rest, seems to be at baseline.  He is mildly tachycardic with heart rates in the low 100s.  His lungs are somewhat diminished but he does not have a lot of wheezing or significantly diminished breath sounds currently.  Discussed with him going home versus hospitalization.  I am not sure what we are going to be able to accomplish by continuously readmitting him to the hospital but he advises that he cannot go home.  He feels like if he goes home he will have to come right back.  Discussed with him whether he needs to be in a facility  that can closely monitor him and this needs.  He is adamantly against this as well.  Will consult the hospitalist.  Discussed with Dr. Alfornia who will admit the patient for further treatment.  (Labs, imaging, consults)  Labs: I Ordered, and personally interpreted labs.  The pertinent results include: Elevated WBC count, mildly elevated troponin but flat and similar to prior values.  BNP normal.  Glucose elevated but no signs of DKA.  Imaging Studies ordered: I ordered imaging studies including chest x-ray I  independently visualized and interpreted imaging. I agree with the radiologist interpretation  Additional history obtained from chart.  External records from outside source obtained and reviewed including prior notes  Cardiac Monitoring: The patient was maintained on a cardiac monitor.  If on the cardiac monitor, I personally viewed and interpreted the cardiac monitored which showed an underlying rhythm of: Sinus tachycardia  Reevaluation: After the interventions noted above, I reevaluated the patient and found that they have :improved  Social Determinants of Health:    Disposition: Admit to hospital  Co morbidities that complicate the patient evaluation  Past Medical History:  Diagnosis Date   Ankylosing spondylitis lumbar region (HCC) 09/25/2022   Anxiety    Bronchitis    COPD (chronic obstructive pulmonary disease) (HCC)    Depression    History of falling 07/29/2021   history of Pulmonary embolism (HCC) 07/02/2022   History of radiation therapy    Left lung- 10/05/20-10/15/20- Dr. Lynwood Nasuti   Hypertension    lung ca 09/2020   MI (myocardial infarction) [facility])    ????   On home oxygen  therapy    4L/min [place]   OSA (obstructive sleep apnea)    Suicidal ideation 06/23/2019   Suicide attempt (HCC)    Tension pneumothorax 06/27/2016   Uveitis      Medicines Meds ordered this encounter  Medications   methylPREDNISolone  sodium succinate (SOLU-MEDROL ) 125 mg/2 mL injection 125 mg    I have reviewed the patients home medicines and have made adjustments as needed  Problem List / ED Course: Problem List Items Addressed This Visit       Respiratory   COPD exacerbation (HCC) - Primary             Final diagnoses:  COPD exacerbation [facility])    ED Discharge Orders     None          Lenor Hollering, MD 12/12/23 [phone]

## 2023-12-12 NOTE — ED Triage Notes (Signed)
 Patient presents from home due to shortness of breath which began around 1100 today and has progressively gotten worse since. When fire arrived, they placed him on 8 L and SPO2 was 86%. EMS placed him on a non re breather followed by a duo neb.     HX: Stage 4 Lung cancer      EMS vitals 97% SPO2 on nebulizer 205 CBG 90 HR 128/80 BP

## 2023-12-13 DIAGNOSIS — J441 Chronic obstructive pulmonary disease with (acute) exacerbation: Secondary | ICD-10-CM

## 2023-12-13 LAB — RESPIRATORY PANEL BY PCR

## 2023-12-13 LAB — CULTURE, BLOOD (ROUTINE X 2)
Culture: NO GROWTH
Culture: NO GROWTH
Special Requests: ADEQUATE
Special Requests: ADEQUATE

## 2023-12-13 LAB — CBC
HCT: 46.6 % (ref 39.0–52.0)
Hemoglobin: 14.6 g/dL (ref 13.0–17.0)
MCH: 29.6 pg (ref 26.0–34.0)
MCHC: 31.3 g/dL (ref 30.0–36.0)
MCV: 94.3 fL (ref 80.0–100.0)
Platelets: 349 K/uL (ref 150–400)
RBC: 4.94 MIL/uL (ref 4.22–5.81)
RDW: 15 % (ref 11.5–15.5)
WBC: 11.7 K/uL — ABNORMAL HIGH (ref 4.0–10.5)
nRBC: 0 % (ref 0.0–0.2)

## 2023-12-13 LAB — PROCALCITONIN: Procalcitonin: <0.1 ng/mL

## 2023-12-13 LAB — GLUCOSE, CAPILLARY
Glucose-Capillary: 177 mg/dL — ABNORMAL HIGH (ref 70–99)
Glucose-Capillary: 224 mg/dL — ABNORMAL HIGH (ref 70–99)
Glucose-Capillary: 159 mg/dL — ABNORMAL HIGH (ref 70–99)
Glucose-Capillary: 198 mg/dL — ABNORMAL HIGH (ref 70–99)
Glucose-Capillary: 160 mg/dL — ABNORMAL HIGH (ref 70–99)

## 2023-12-13 LAB — SARS CORONAVIRUS 2 BY RT PCR: SARS Coronavirus 2 by RT PCR: NEGATIVE

## 2023-12-13 MED ORDER — INSULIN ASPART 100 UNIT/ML IJ SOLN
0.0000 [IU] | Freq: Three times a day (TID) | INTRAMUSCULAR | Status: DC
  Administered 2023-12-13: 3 [IU] via SUBCUTANEOUS
  Administered 2023-12-13 (×2): 2 [IU] via SUBCUTANEOUS
  Administered 2023-12-14: 3 [IU] via SUBCUTANEOUS
  Administered 2023-12-14: 2 [IU] via SUBCUTANEOUS
  Administered 2023-12-15: 1 [IU] via SUBCUTANEOUS
  Administered 2023-12-15 (×2): 2 [IU] via SUBCUTANEOUS

## 2023-12-13 MED ORDER — DULOXETINE HCL 60 MG PO CPEP
60.0000 mg | ORAL_CAPSULE | Freq: Two times a day (BID) | ORAL | Status: DC
  Administered 2023-12-13 – 2023-12-15 (×5): 60 mg via ORAL
  Filled 2023-12-13 (×5): qty 1

## 2023-12-13 MED ORDER — BISACODYL 5 MG PO TBEC: 10.0000 mg | DELAYED_RELEASE_TABLET | Freq: Every day | ORAL | Status: DC | PRN

## 2023-12-13 MED ORDER — ASPIRIN 81 MG PO TBEC
81.0000 mg | DELAYED_RELEASE_TABLET | Freq: Every day | ORAL | Status: DC
  Administered 2023-12-13 – 2023-12-15 (×3): 81 mg via ORAL
  Filled 2023-12-13 (×3): qty 1

## 2023-12-13 MED ORDER — OXCARBAZEPINE 300 MG PO TABS
900.0000 mg | ORAL_TABLET | Freq: Two times a day (BID) | ORAL | Status: DC
  Administered 2023-12-13 – 2023-12-15 (×5): 900 mg via ORAL
  Filled 2023-12-13 (×6): qty 3

## 2023-12-13 MED ORDER — VITAMIN D 25 MCG (1000 UNIT) PO TABS
1000.0000 [IU] | ORAL_TABLET | Freq: Every day | ORAL | Status: AC
Start: 2023-12-14 — End: ?
  Administered 2023-12-14 – 2023-12-15 (×2): 1000 [IU] via ORAL
  Filled 2023-12-13 (×2): qty 1

## 2023-12-13 MED ORDER — ACETAMINOPHEN 325 MG PO TABS
650.0000 mg | ORAL_TABLET | Freq: Four times a day (QID) | ORAL | Status: DC | PRN
  Administered 2023-12-14: 650 mg via ORAL
  Filled 2023-12-13: qty 2

## 2023-12-13 MED ORDER — DOCUSATE SODIUM 100 MG PO CAPS
200.0000 mg | ORAL_CAPSULE | Freq: Every day | ORAL | Status: DC
  Administered 2023-12-13 – 2023-12-15 (×3): 200 mg via ORAL
  Filled 2023-12-13 (×3): qty 2

## 2023-12-13 MED ORDER — ACETAMINOPHEN 650 MG RE SUPP: 650.0000 mg | Freq: Four times a day (QID) | RECTAL | Status: DC | PRN

## 2023-12-13 MED ORDER — ARFORMOTEROL TARTRATE 15 MCG/2ML IN NEBU
15.0000 ug | INHALATION_SOLUTION | Freq: Two times a day (BID) | RESPIRATORY_TRACT | Status: DC
  Administered 2023-12-13 – 2023-12-15 (×5): 15 ug via RESPIRATORY_TRACT
  Filled 2023-12-13 (×5): qty 2

## 2023-12-13 MED ORDER — GUAIFENESIN ER 600 MG PO TB12: 600.0000 mg | ORAL_TABLET | Freq: Two times a day (BID) | ORAL | Status: DC | PRN

## 2023-12-13 MED ORDER — UMECLIDINIUM BROMIDE 62.5 MCG/ACT IN AEPB
1.0000 | INHALATION_SPRAY | Freq: Every day | RESPIRATORY_TRACT | Status: DC
  Filled 2023-12-13: qty 7

## 2023-12-13 MED ORDER — HYDROCODONE-ACETAMINOPHEN 10-325 MG PO TABS: 1.0000 | ORAL_TABLET | Freq: Four times a day (QID) | ORAL | Status: DC | PRN

## 2023-12-13 MED ORDER — BUPROPION HCL ER (XL) 300 MG PO TB24
300.0000 mg | ORAL_TABLET | Freq: Every morning | ORAL | Status: DC
  Administered 2023-12-13 – 2023-12-15 (×3): 300 mg via ORAL
  Filled 2023-12-13 (×3): qty 1

## 2023-12-13 MED ORDER — VITAMIN B-12 100 MCG PO TABS
100.0000 ug | ORAL_TABLET | Freq: Every day | ORAL | Status: DC
  Administered 2023-12-14 – 2023-12-15 (×2): 100 ug via ORAL
  Filled 2023-12-13 (×2): qty 1

## 2023-12-13 MED ORDER — GABAPENTIN 100 MG PO CAPS
100.0000 mg | ORAL_CAPSULE | Freq: Three times a day (TID) | ORAL | Status: DC
  Administered 2023-12-13 – 2023-12-15 (×8): 100 mg via ORAL
  Filled 2023-12-13 (×8): qty 1

## 2023-12-13 MED ORDER — PANTOPRAZOLE SODIUM 40 MG PO TBEC
40.0000 mg | DELAYED_RELEASE_TABLET | Freq: Every day | ORAL | Status: DC
  Administered 2023-12-13 – 2023-12-15 (×3): 40 mg via ORAL
  Filled 2023-12-13 (×3): qty 1

## 2023-12-13 MED ORDER — INSULIN ASPART 100 UNIT/ML IJ SOLN
0.0000 [IU] | Freq: Every day | INTRAMUSCULAR | Status: DC
  Administered 2023-12-14: 2 [IU] via SUBCUTANEOUS

## 2023-12-13 MED ORDER — TRAZODONE HCL 50 MG PO TABS
75.0000 mg | ORAL_TABLET | Freq: Every day | ORAL | Status: DC
  Administered 2023-12-13 – 2023-12-14 (×2): 75 mg via ORAL
  Filled 2023-12-13 (×2): qty 2

## 2023-12-13 MED ORDER — OXYCODONE HCL 5 MG PO TABS
2.5000 mg | ORAL_TABLET | ORAL | Status: DC | PRN
  Administered 2023-12-14: 2.5 mg via ORAL
  Filled 2023-12-13: qty 1

## 2023-12-13 MED ORDER — BUDESONIDE 0.25 MG/2ML IN SUSP
0.2500 mg | Freq: Two times a day (BID) | RESPIRATORY_TRACT | Status: DC
  Administered 2023-12-13 – 2023-12-15 (×4): 0.25 mg via RESPIRATORY_TRACT
  Filled 2023-12-13 (×4): qty 2

## 2023-12-13 MED ORDER — LACTULOSE 10 GM/15ML PO SOLN: 20.0000 g | Freq: Two times a day (BID) | ORAL | Status: DC | PRN

## 2023-12-13 MED ORDER — ENOXAPARIN SODIUM 40 MG/0.4ML IJ SOSY
40.0000 mg | PREFILLED_SYRINGE | INTRAMUSCULAR | Status: AC
Start: 2023-12-13 — End: ?
  Administered 2023-12-13 – 2023-12-15 (×3): 40 mg via SUBCUTANEOUS
  Filled 2023-12-13 (×3): qty 0.4

## 2023-12-13 MED ORDER — DOCUSATE SODIUM 100 MG PO CAPS
100.0000 mg | ORAL_CAPSULE | Freq: Every day | ORAL | Status: DC
  Administered 2023-12-13 – 2023-12-14 (×2): 100 mg via ORAL
  Filled 2023-12-13 (×2): qty 1

## 2023-12-13 MED ORDER — TAMSULOSIN HCL 0.4 MG PO CAPS
0.8000 mg | ORAL_CAPSULE | Freq: Every day | ORAL | Status: DC
  Administered 2023-12-13 – 2023-12-15 (×3): 0.8 mg via ORAL
  Filled 2023-12-13 (×3): qty 2

## 2023-12-13 MED ORDER — MELATONIN 3 MG PO TABS
6.0000 mg | ORAL_TABLET | Freq: Every day | ORAL | Status: DC
  Administered 2023-12-13 – 2023-12-14 (×2): 6 mg via ORAL
  Filled 2023-12-13 (×2): qty 2

## 2023-12-13 MED ORDER — FERROUS SULFATE 325 (65 FE) MG PO TABS
325.0000 mg | ORAL_TABLET | Freq: Every day | ORAL | Status: DC
  Administered 2023-12-14 – 2023-12-15 (×2): 325 mg via ORAL
  Filled 2023-12-13 (×2): qty 1

## 2023-12-13 MED ORDER — IPRATROPIUM-ALBUTEROL 0.5-2.5 (3) MG/3ML IN SOLN
3.0000 mL | Freq: Four times a day (QID) | RESPIRATORY_TRACT | Status: DC
  Administered 2023-12-13 – 2023-12-15 (×7): 3 mL via RESPIRATORY_TRACT
  Filled 2023-12-13 (×8): qty 3

## 2023-12-13 MED ORDER — DILTIAZEM HCL ER COATED BEADS 120 MG PO CP24
120.0000 mg | ORAL_CAPSULE | Freq: Every day | ORAL | Status: AC
Start: 2023-12-13 — End: ?
  Administered 2023-12-13 – 2023-12-15 (×3): 120 mg via ORAL
  Filled 2023-12-13 (×3): qty 1

## 2023-12-13 MED ORDER — FLUTICASONE PROPIONATE 50 MCG/ACT NA SUSP
2.0000 | Freq: Every day | NASAL | Status: DC
  Administered 2023-12-13 – 2023-12-15 (×3): 2 via NASAL
  Filled 2023-12-13: qty 16

## 2023-12-13 MED ORDER — BUSPIRONE HCL 5 MG PO TABS
15.0000 mg | ORAL_TABLET | Freq: Every day | ORAL | Status: DC
  Administered 2023-12-13 – 2023-12-15 (×3): 15 mg via ORAL
  Filled 2023-12-13 (×3): qty 3

## 2023-12-13 MED ORDER — POLYETHYLENE GLYCOL 3350 17 G PO PACK
17.0000 g | PACK | Freq: Every day | ORAL | Status: DC
  Administered 2023-12-14 – 2023-12-15 (×2): 17 g via ORAL
  Filled 2023-12-13 (×2): qty 1

## 2023-12-13 MED ORDER — PREDNISONE 20 MG PO TABS
40.0000 mg | ORAL_TABLET | Freq: Every day | ORAL | Status: DC
  Administered 2023-12-13 – 2023-12-14 (×2): 40 mg via ORAL
  Filled 2023-12-13 (×2): qty 2

## 2023-12-13 MED ORDER — CYCLOSPORINE 0.05 % OP EMUL
1.0000 [drp] | Freq: Two times a day (BID) | OPHTHALMIC | Status: DC
  Administered 2023-12-13 – 2023-12-15 (×4): 1 [drp] via OPHTHALMIC
  Filled 2023-12-13 (×5): qty 30

## 2023-12-13 MED ORDER — ALBUTEROL SULFATE (2.5 MG/3ML) 0.083% IN NEBU: 2.5000 mg | INHALATION_SOLUTION | Freq: Four times a day (QID) | RESPIRATORY_TRACT | Status: DC | PRN

## 2023-12-13 NOTE — Progress Notes (Signed)
 Pt refused his [facility] at this time.

## 2023-12-13 NOTE — Consult Note (Signed)
 Palliative Care Consult Note                                  Date: 12/13/2023   Patient Name: Mario [name]  DOB:10/09/1950  FMW:[id number]  Age / Sex:73 y.o., male  PCP: [name] Referring Physician: Madelyne Owen LABOR, MD  Reason for Consultation: Establishing goals of care  Past Medical History:  Diagnosis Date   Ankylosing spondylitis lumbar region [facility]) 09/25/2022   Anxiety    Bronchitis    COPD (chronic obstructive pulmonary disease) (HCC)    Depression    History of falling 07/29/2021   history of Pulmonary embolism (HCC) 07/02/2022   History of radiation therapy    Left lung- 10/05/20-10/15/20- Dr. Lynwood Nasuti   Hypertension    lung ca 09/2020   MI (myocardial infarction) [facility])    ????   On home oxygen  therapy    4L/min [place]   OSA (obstructive sleep apnea)    Suicidal ideation 06/23/2019   Suicide attempt [facility])    Tension pneumothorax 06/27/2016   Uveitis      Assessment & Plan:   HPI/Patient Profile: 73 y.o. male  with past medical history of end-stage COPD with recurrent hospitalizations, metastatic lung adenocarcinoma s/p radiation, OSA noncompliant with CPAP, depression with prior suicide attempt and history of malingering behavior admitted on 12/12/2023 with shortness of breath. Palliative consulted for goals of care conversation in setting of end stage COPD and recurrent admissions.   Of note patient has had 4 admissions in the last 2 weeks for dyspnea and multifocal pneumonia. Multiple ED visits with complaints of shortness of breath in the last year. Patient was most recently discharged on 12/09/2023 after presenting with shortness of breath where no antibiotics were indicated and discharged after 1 day in the hospital. Patient has been seen by palliative service and continues to express that he is not ready for hospice. Patient has also been seen by the [facility] for palliative services but also  expresses that he wants to continue to current medical management.   Based on prior notes from oncology on 10/4, patient would not benefit from systemic chemotherapy and given patient's poor baseline would not tolerate it well. Prior plans were to follow up with outpatient oncology with Dr. Gatha but patient does not follow up.   Palliative medicine is specialized medical care for people living with serious illness. It focuses on providing relief from the symptoms and stress of a serious illness. The goal is to improve quality of life for both the patient and the family.  Goals of care: Broad aims of medical therapy in relation to the patient's values and preferences. Our aim is to provide medical care aimed at enabling patients to achieve the goals that matter most to them, given the circumstances of their particular medical situation and their constraints.    SUMMARY OF RECOMMENDATIONS   DNR - limited Oxycodone  2.5 mg PRN  for dyspnea Outpatient Palliative Consult  Reach out to the [facility] for coordination of care and placement  Symptom Management:  Oxycodone  2.5 mg PRN for dyspnea  Code Status: DNR - Limited (DNR/DNI)  Prognosis:  Unable to determine  Discharge Planning:  To Be Determined   Discussed with: Regalado MD   Celestia RN  Subjective:   Reviewed medical records, received report from team, assessed the patient and then meet at the patient's bedside to discuss diagnosis, prognosis, GOC,  EOL wishes disposition and options.  Before meeting with the patient/family, I spent time reviewing the chart notes including prior discharge notes, oncology notes, palliative notes. I also reviewed vital signs, nursing flowsheets, medication administrations record, labs, and imaging. Labs/imaging reviewed include CXR and respiratory pathogen panel.  I met with the patient bedside. No visitors at bedside.    We meet to discuss diagnosis prognosis, GOC, EOL wishes, disposition and options.  Concept of Palliative Care was introduced as specialized medical care for people and their families living with serious illness.  It focuses on providing relief from the symptoms and stress of a serious illness.  The goal is to improve quality of life for both the patient and the family. Values and goals of care important to patient and family were attempted to be elicited.  Created space and opportunity for patient  and family to explore thoughts and feelings regarding current medical situation   Natural trajectory and current clinical status were discussed. Questions and concerns addressed. Patient encouraged to call with questions or concerns.    Patient/Family Understanding of Illness: - Patient appears to have a poor understanding of his cancer and end stage COPD - Expresses he wants treatment for his metastatic cancer but fails for follow up with oncology - Does not seem to understand that his COPD will continue to worsen as well as not accepting of medications prescribed for dyspnea  Life Review: - Patient is a veteran and has connections to the [place] for healthcare through his PCP.  - Currently single and lives with a roommate, patient is frustrated with his current living situation, he has a roommate who has a dog,patient states he gets no rest at his current home setting.  - Has a daughter who lives in [place]   Baseline Status: - Lives in an apartment with a roommate who smokes - Roommate has a dog that keeps the patient up and does not allow him to rest - Expresses that he does not want to live in a SNF - 6-8 L/min oxygen  baseline - Continues to eat well  Today's Discussion: - Patient shared that he has not been feeling well since he was discharged on 10/11 and that he continues to feel short of breath even though he increased his O2 to 9 L/min at home which prompted him to seek help - Patient expressed that he is not ready for hospice and wants to continue to treat what he is able  to treat - Continues to express that he has not slept well in the last couple of days - Inquired about the patient's previous experience with morphine  for dyspnea and he was unable to share why he was not able to tolerate the morphine  - Patient is okay with starting oxycodone  for dyspnea instead  Goals: - Wants a new living arrangement so that he can be away from his current roommate  Review of Systems  All other systems reviewed and are negative.   Objective:   Primary Diagnoses: Present on Admission:  COPD exacerbation (HCC)  Chronic pain  BPH (benign prostatic hyperplasia)  Mood disorder   Vital Signs:  BP (!) 145/61 (BP Location: Left Arm)   Pulse 96   Temp 98.3 F (36.8 C) (Oral)   Resp 18   Ht 6' 2 (1.88 m)   Wt 128.4 kg   SpO2 91%   BMI 36.34 kg/m   Physical Exam HENT:     Head: Normocephalic.  Eyes:     Extraocular Movements: Extraocular  movements intact.  Pulmonary:     Effort: Tachypnea present.  Neurological:     General: No focal deficit present.     Mental Status: He is alert.  Psychiatric:        Mood and Affect: Mood is anxious.        Behavior: Behavior is agitated.     Palliative Assessment/Data: 70%    Thank you for allowing us  to participate in the care of Mario [name] PMT will continue to support holistically.  Billing based on MDM: High  Problems Addressed: One or more chronic illnesses with severe exacerbation, progression, or side effects of treatment.  Amount and/or Complexity of Data: Category 1:Review of prior external note(s) from each unique source, Review of the result(s) of each unique test, and Assessment requiring an independent historian(s) and Category 3:Discussion of management or test interpretation with external physician/other qualified health care professional/appropriate source (not separately reported)  Risks: Parenteral controlled substances  Detailed review of medical records (labs, imaging, vital signs),  medically appropriate exam, discussed with treatment team, counseling and education to patient, family, & staff, documenting clinical information, medication management, coordination of care.  Signed by: Lonia Serve MD.  Mario [name] Mario [name] Palliative Medicine Team  Team Phone # [phone] (Nights/Weekends)  12/13/2023, 11:33 AM

## 2023-12-13 NOTE — Plan of Care (Signed)

## 2023-12-13 NOTE — Progress Notes (Signed)
 PROGRESS NOTE    Mario [name]  FMW:[id number] DOB: 09/12/1950 DOA: 12/12/2023 [facility]CP: [name]   Brief Narrative: 73 year old with past medical history significant for end-stage COPD with multiple recent hospitalization, chronic hypoxic respiratory failure on 8 L of oxygen , metastatic [place] cancer status post SBRT, malignant pleural effusion, left lower lobe radiation with fibrosis, diabetes type 2, peripheral neuropathy, class II obesity, OSA noncompliant with CPAP, hypertension, history of PE was on Eliquis  until stopped due to ongoing hemoptysis, , ankylosing spondylitis, GERD, BPH, anxiety/depression/bipolar disorder with prior history of suicide attempt and also has history of malingering behavior.   Patient presents complaining of shortness of breath, continued to have cough.  Patient was placed IV Solu-Medrol .  Chest x-ray no need show bilateral left mid and lower lung consolidation and right lower lobe consolidation which is not significantly changed.  Assessment & Plan:   Principal Problem:   COPD exacerbation (HCC) Active Problems:   DM2 (diabetes mellitus, type 2) (HCC)   Chronic pain   BPH (benign prostatic hyperplasia)   Mood disorder   1-End-stage COPD Chronic hypoxic respiratory failure on 8 L of oxygen  - Chest x-ray no acute, show bilateral left mid and lower lung consolidation and right lower lobe consolidation. - He has had 2 recent  CT angio; 10/02 and 10/10 both negative for PE. - Agree with palliative care consultation, for symptom management - Continue with Prednisone  -Continue Brovana , start Pulmicort .  Scheduled DuoNebs, refused Incruse - Procalcitonin less than 0.10, CT findings are chronic.  Afebrile. -COVID and respiratory panel negative  Metastatic lung adenocarcinoma - Patient will need to follow-up with oncology of choice - Palliative care consulted  Diabetes type 2 Steroid-induced hyperglycemia - Sliding scale  insulin   Hypertension; continue Cardizem  BPH; continue Flomax  GERD; continue PPI Mood disorder; continue trazodone , gabapentin , Cymbalta , BuSpar  Chronic pain syndrome/neuropathy Chronic constipation  Estimated body mass index is 36.34 kg/m as calculated from the following:   Height as of this encounter: 6' 2 (1.88 m).   Weight as of this encounter: 128.4 kg.   DVT prophylaxis: Lovenox  Code Status: DNR Family Communication: Care discussed with patient Disposition Plan:  Status is: Observation The patient remains OBS appropriate and will d/c before 2 midnights.    Consultants:  Palliative care  Procedures:  None Antimicrobials:    Subjective: Patient reported that he was not able to sleep last night he only slept 3 hours.  He feels very tired.  He reported continued shortness of breath and cough. Talked that he need to speak with palliative care and hospice care for symptom management.  Objective: Vitals:   12/12/23 2329 12/13/23 0100 12/13/23 0235 12/13/23 0752  BP:  (!) 168/94 (!) 174/101   Pulse:  100 98   Resp:  (!) 22 18   Temp: (!) 97.5 F (36.4 C)  98.3 F (36.8 C)   TempSrc: Oral     SpO2:  98% 95% 91%  Weight:      Height:        Intake/Output Summary (Last 24 hours) at 12/13/2023 0815 Last data filed at 12/13/2023 0707 Gross per 24 hour  Intake 480 ml  Output 350 ml  Net 130 ml   Filed Weights   12/12/23 1921  Weight: 128.4 kg    Examination:  General exam: Appears calm and comfortable  Respiratory system: Clear to auscultation. Respiratory effort normal. Cardiovascular system: S1 & S2 heard, RRR. No JVD, murmurs, rubs, gallops or clicks. No pedal edema. Gastrointestinal system: Abdomen  is nondistended, soft and nontender. No organomegaly or masses felt. Normal bowel sounds heard. Central nervous system: Alert and oriented. No focal neurological deficits. Extremities: Symmetric 5 x 5 power.    Data Reviewed: I have personally reviewed  following labs and imaging studies  CBC: Recent Labs  Lab 12/08/23 0305 12/12/23 1944 12/13/23 0533  WBC 17.8* 16.2* 11.7*  NEUTROABS 13.0*  --   --   HGB 14.6 13.9 14.6  HCT 46.3 44.2 46.6  MCV 91.3 91.9 94.3  PLT 370 357 349   Basic Metabolic Panel: Recent Labs  Lab 12/07/23 0538 12/07/23 0828 12/08/23 0305 12/12/23 1944  NA  --  136 135 136  K  --  4.8 4.3 4.7  CL  --  97* 97* 99  CO2  --  32 27 25  GLUCOSE  --  106* 130* 276*  BUN  --  15 19 18   CREATININE 0.94 0.94 0.99 0.89  CALCIUM   --  9.3 9.8 8.7*   GFR: Estimated Creatinine Clearance: 106.9 mL/min (by C-G formula based on SCr of 0.89 mg/dL). Liver Function Tests: Recent Labs  Lab 12/08/23 0305  AST 20  ALT 22  ALKPHOS 88  BILITOT 0.2  PROT 7.4  ALBUMIN 4.2   No results for input(s): LIPASE, AMYLASE in the last 168 hours. No results for input(s): AMMONIA in the last 168 hours. Coagulation Profile: No results for input(s): INR, PROTIME in the last 168 hours. Cardiac Enzymes: No results for input(s): CKTOTAL, CKMB, CKMBINDEX, TROPONINI in the last 168 hours. BNP (last 3 results) Recent Labs    11/30/23 0300 12/07/23 0828 12/12/23 1944  PROBNP 127.0 52.1 <50.0   HbA1C: No results for input(s): HGBA1C in the last 72 hours. CBG: Recent Labs  Lab 12/09/23 1140 12/09/23 1615 12/12/23 1949 12/13/23 0405 12/13/23 0811  GLUCAP 157* 207* 275* 177* 224*   Lipid Profile: No results for input(s): CHOL, HDL, LDLCALC, TRIG, CHOLHDL, LDLDIRECT in the last 72 hours. Thyroid  Function Tests: No results for input(s): TSH, T4TOTAL, FREET4, T3FREE, THYROIDAB in the last 72 hours. Anemia Panel: No results for input(s): VITAMINB12, FOLATE, FERRITIN, TIBC, IRON, RETICCTPCT in the last 72 hours. Sepsis Labs: Recent Labs  Lab 12/13/23 0533  PROCALCITON <0.10    Recent Results (from the past 240 hours)  Culture, blood (Routine x 2)     Status: None    Collection Time: 12/08/23  3:05 AM   Specimen: BLOOD  Result Value Ref Range Status   Specimen Description   Final    BLOOD RIGHT ANTECUBITAL Performed at [facility], [address] [place], [place] [address]    Special Requests   Final    Blood Culture adequate volume BOTTLES DRAWN AEROBIC AND ANAEROBIC Performed at [facility], [address], [place], [place] [address]    Culture   Final    NO GROWTH 5 DAYS Performed at [facility], [address] [place], [place] [address]    Report Status 12/13/2023 FINAL  Final  Resp panel by RT-PCR (RSV, Flu A&B, Covid) Anterior Nasal Swab     Status: None   Collection Time: 12/08/23  3:14 AM   Specimen: Anterior Nasal Swab  Result Value Ref Range Status   SARS Coronavirus 2 by RT PCR NEGATIVE NEGATIVE Final    Comment: (NOTE) SARS-CoV-2 target nucleic acids are NOT DETECTED.  The SARS-CoV-2 RNA is generally detectable in upper respiratory specimens during the acute phase of infection. The lowest  concentration of SARS-CoV-2 viral copies this assay can detect is 138 copies/mL. A negative result does not preclude SARS-Cov-2 infection and should not be used as the sole basis for treatment or other patient management decisions. A negative result may occur with  improper specimen collection/handling, submission of specimen other than nasopharyngeal swab, presence of viral mutation(s) within the areas targeted by this assay, and inadequate number of viral copies(<138 copies/mL). A negative result must be combined with clinical observations, patient history, and epidemiological information. The expected result is Negative.  Fact Sheet for Patients:  BloggerCourse.com  Fact Sheet for Healthcare Providers:  SeriousBroker.it  This test is no t yet approved or cleared by the [place]  FDA and  has been authorized for detection and/or  diagnosis of SARS-CoV-2 by FDA under an Emergency Use Authorization (EUA). This EUA will remain  in effect (meaning this test can be used) for the duration of the COVID-19 declaration under Section 564(b)(1) of the Act, 21 U.S.C.section 360bbb-3(b)(1), unless the authorization is terminated  or revoked sooner.       Influenza A by PCR NEGATIVE NEGATIVE Final   Influenza B by PCR NEGATIVE NEGATIVE Final    Comment: (NOTE) The Xpert Xpress SARS-CoV-2/FLU/RSV plus assay is intended as an aid in the diagnosis of influenza from Nasopharyngeal swab specimens and should not be used as a sole basis for treatment. Nasal washings and aspirates are unacceptable for Xpert Xpress SARS-CoV-2/FLU/RSV testing.  Fact Sheet for Patients: BloggerCourse.com  Fact Sheet for Healthcare Providers: SeriousBroker.it  This test is not yet approved or cleared by the [place]  FDA and has been authorized for detection and/or diagnosis of SARS-CoV-2 by FDA under an Emergency Use Authorization (EUA). This EUA will remain in effect (meaning this test can be used) for the duration of the COVID-19 declaration under Section 564(b)(1) of the Act, 21 U.S.C. section 360bbb-3(b)(1), unless the authorization is terminated or revoked.     Resp Syncytial Virus by PCR NEGATIVE NEGATIVE Final    Comment: (NOTE) Fact Sheet for Patients: BloggerCourse.com  Fact Sheet for Healthcare Providers: SeriousBroker.it  This test is not yet approved or cleared by the [place]  FDA and has been authorized for detection and/or diagnosis of SARS-CoV-2 by FDA under an Emergency Use Authorization (EUA). This EUA will remain in effect (meaning this test can be used) for the duration of the COVID-19 declaration under Section 564(b)(1) of the Act, 21 U.S.C. section 360bbb-3(b)(1), unless the authorization is terminated  or revoked.  Performed at [facility], [address] [place], [place] [address]   Culture, blood (Routine x 2)     Status: None   Collection Time: 12/08/23  8:11 PM   Specimen: BLOOD RIGHT HAND  Result Value Ref Range Status   Specimen Description   Final    BLOOD RIGHT HAND Performed at [facility], [address] [place], [place] [address]    Special Requests   Final    BOTTLES DRAWN AEROBIC AND ANAEROBIC Blood Culture adequate volume Performed at [facility], [address], [place], [place] [address]    Culture   Final    NO GROWTH 5 DAYS Performed at [facility], [address], [place], [place] [address]    Report Status 12/13/2023 FINAL  Final  SARS Coronavirus 2 by RT PCR (hospital order, performed in [facility] hospital lab) *cepheid single result test* Anterior Nasal Swab     Status: None   Collection Time:  12/13/23  3:20 AM   Specimen: Anterior Nasal Swab  Result Value Ref Range Status   SARS Coronavirus 2 by RT PCR NEGATIVE NEGATIVE Final    Comment: (NOTE) SARS-CoV-2 target nucleic acids are NOT DETECTED.  The SARS-CoV-2 RNA is generally detectable in upper and lower respiratory specimens during the acute phase of infection. The lowest concentration of SARS-CoV-2 viral copies this assay can detect is 250 copies / mL. A negative result does not preclude SARS-CoV-2 infection and should not be used as the sole basis for treatment or other patient management decisions.  A negative result may occur with improper specimen collection / handling, submission of specimen other than nasopharyngeal swab, presence of viral mutation(s) within the areas targeted by this assay, and inadequate number of viral copies (<250 copies / mL). A negative result must be combined with clinical observations, patient history, and epidemiological information.  Fact Sheet for Patients:    RoadLapTop.co.za  Fact Sheet for Healthcare Providers: http://kim-miller.com/  This test is not yet approved or  cleared by the [place]  FDA and has been authorized for detection and/or diagnosis of SARS-CoV-2 by FDA under an Emergency Use Authorization (EUA).  This EUA will remain in effect (meaning this test can be used) for the duration of the COVID-19 declaration under Section 564(b)(1) of the Act, 21 U.S.C. section 360bbb-3(b)(1), unless the authorization is terminated or revoked sooner.  Performed at [facility], [address], [place], [place] [address]          Radiology Studies: DG Chest 2 View Result Date: 12/12/2023 EXAM: 2 VIEW(S) XRAY OF THE CHEST 12/12/2023 08:15:00 PM COMPARISON: Chest x-ray 11/30/2023 and chest CT 12/08/2023. CLINICAL HISTORY: Shortness of breath. Per chart - Patient presents from home due to shortness of breath which began around 1100 today and has progressively gotten worse since. When fire arrived, they placed him on 8 L and SPO2 was 86%. EMS placed him on a non re breather followed by a duo ; neb. HX: Stage 4 Lung cancer. FINDINGS: LUNGS AND PLEURA: Bilateral left mid and lower lung consolidation and right lower lung consolidation have not significantly changed. No pulmonary edema. No significant pleural effusion or pneumothorax. HEART AND MEDIASTINUM: The heart is mildly enlarged, unchanged. BONES AND SOFT TISSUES: No acute osseous abnormality. IMPRESSION: 1. Bilateral left mid and lower lung consolidation and right lower lung consolidation, not significantly changed. 2. Mild cardiomegaly, unchanged. Electronically signed by: Greig Pique MD 12/12/2023 08:23 PM EDT RP Workstation: [id number]        Scheduled Meds:  arformoterol   15 mcg Nebulization BID   And   umeclidinium bromide   1 puff Inhalation Daily   aspirin  EC  81 mg Oral Daily   buPROPion   300 mg Oral q morning    busPIRone   15 mg Oral Daily   diltiazem   120 mg Oral Daily   docusate sodium   100 mg Oral QHS   docusate sodium   200 mg Oral Daily   DULoxetine   60 mg Oral BID   enoxaparin  (LOVENOX ) injection  40 mg Subcutaneous Q24H   fluticasone   2 spray Each Nare Daily   gabapentin   100 mg Oral TID   insulin  aspart  0-5 Units Subcutaneous QHS   insulin  aspart  0-9 Units Subcutaneous TID WC   melatonin  6 mg Oral QHS   Oxcarbazepine   900 mg Oral BID   pantoprazole   40 mg Oral Daily   predniSONE   40 mg Oral Q breakfast   tamsulosin   0.8 mg Oral Daily   traZODone   75 mg Oral QHS   Continuous Infusions:   LOS: 0 days    Time spent: 35 minutes    Wilfrido Luedke A Antonietta Lansdowne, MD [facility]   If 7PM-7AM, please contact night-coverage [email or web address]  12/13/2023, 8:15 AM

## 2023-12-13 NOTE — H&P (Signed)
 History and Physical    [name] FMW:[id number] DOB: [birth year] DOA: 12/12/2023  PCP: [name]  Patient coming from: Home  Chief Complaint: Shortness of breath  HPI: Mario [name] is a 73 y.o. male with medical history significant of end-stage COPD with multiple recent hospitalizations, chronic hypoxemic respiratory failure on 8 L home oxygen , metastatic lung adenocarcinoma status post SBRT, malignant pleural effusion, left lower lobe radiation fibrosis, type 2 diabetes with peripheral neuropathy, class II obesity (BMI 36.34), aortic atherosclerosis, OSA noncompliant with CPAP, hypertension, history of PE for which he was on Eliquis  during his admission in February 2025 which was stopped during that time due to ongoing hemoptysis and epistaxis, ankylosing spondylitis, GERD, BPH, anxiety/depression/bipolar disorder with prior history of suicide attempt and also has history of malingering behavior.  Patient returns to the ED today complaining of shortness of breath.  States he was discharged from the hospital too early last time.  He has continued to have shortness of breath since he went home and continues to cough.  No increase in sputum production from baseline.  Denies fevers or chest pain.  He is using his home maintenance and rescue inhaler.  He stopped smoking cigarettes several years ago.  States his pulmonologist and oncologist are at the [facility] but he has not seen them in a while.  ED Course: EMS placed him on nonrebreather and he was given a DuoNeb treatment.  Slightly tachycardic and tachypneic on arrival to the ED.  Afebrile.  In the ED, patient was satting well on 8 L HFNC.  Patient was given Solu-Medrol  125 mg.  Labs notable for WBC count 16.2, glucose 276, bicarb 25, anion gap 12, proBNP <50, troponin 24> 17.  EKG showing sinus tachycardia and no acute ischemic changes.  Chest x-ray showing no acute findings.  Showing bilateral left mid and lower lung consolidation  and right lower lobe consolidation which is not significantly changed compared to previous imaging.  Patient refused to go home and [facility] called to admit.  Review of Systems:  Review of Systems  All other systems reviewed and are negative.   Past Medical History:  Diagnosis Date   Ankylosing spondylitis lumbar region [facility]) 09/25/2022   Anxiety    Bronchitis    COPD (chronic obstructive pulmonary disease) (HCC)    Depression    History of falling 07/29/2021   history of Pulmonary embolism (HCC) 07/02/2022   History of radiation therapy    Left lung- 10/05/20-10/15/20- Dr. Lynwood Nasuti   Hypertension    lung ca 09/2020   MI (myocardial infarction) [facility])    ????   On home oxygen  therapy    4L/min [place]   OSA (obstructive sleep apnea)    Suicidal ideation 06/23/2019   Suicide attempt (HCC)    Tension pneumothorax 06/27/2016   Uveitis     Past Surgical History:  Procedure Laterality Date   BIOPSY  07/03/2021   Procedure: BIOPSY;  Surgeon: Elicia Claw, MD;  Location: [facility] ENDOSCOPY;  Service: Gastroenterology;;   BRONCHIAL BIOPSY  07/30/2020   Procedure: BRONCHIAL BIOPSIES;  Surgeon: Brenna Adine CROME, DO;  Location: [facility] ENDOSCOPY;  Service: Pulmonary;;   BRONCHIAL BRUSHINGS  07/30/2020   Procedure: BRONCHIAL BRUSHINGS;  Surgeon: Brenna Adine CROME, DO;  Location: [facility] ENDOSCOPY;  Service: Pulmonary;;   BRONCHIAL NEEDLE ASPIRATION BIOPSY  07/30/2020   Procedure: BRONCHIAL NEEDLE ASPIRATION BIOPSIES;  Surgeon: Brenna Adine CROME, DO;  Location: [facility] ENDOSCOPY;  Service: Pulmonary;;   BRONCHIAL WASHINGS  07/30/2020  Procedure: BRONCHIAL WASHINGS;  Surgeon: Brenna Adine CROME, DO;  Location: [facility] ENDOSCOPY;  Service: Pulmonary;;   BRONCHIAL WASHINGS  04/17/2023   Procedure: BRONCHIAL WASHINGS;  Surgeon: Claudene Toribio BROCKS, MD;  Location[facility] ENDOSCOPY;  Service: Pulmonary;;   CHEST TUBE INSERTION Left 06/27/2016   CRYOTHERAPY  04/17/2023   Procedure: CRYOTHERAPY;  Surgeon: Claudene Toribio BROCKS, MD;  Location: [facility]  ENDOSCOPY;  Service: Pulmonary;;   cryptorchidism     ESOPHAGOGASTRODUODENOSCOPY N/A 07/03/2021   Procedure: ESOPHAGOGASTRODUODENOSCOPY (EGD);  Surgeon: Elicia Claw, MD;  Location: [facility];  Service: Gastroenterology;  Laterality: N/A;   HEMOSTASIS CONTROL  04/17/2023   Procedure: HEMOSTASIS CONTROL;  Surgeon: Claudene Toribio BROCKS, MD;  Location: [facility] ENDOSCOPY;  Service: Pulmonary;;   IR PERC PLEURAL DRAIN W/INDWELL CATH W/IMG GUIDE  08/04/2021   IR REMOVAL OF PLURAL CATH W/CUFF  09/01/2021   SKIN CANCER EXCISION     VIDEO BRONCHOSCOPY Left 04/17/2023   Procedure: VIDEO BRONCHOSCOPY WITHOUT FLUORO;  Surgeon: Claudene Toribio BROCKS, MD;  Location: [facility] ENDOSCOPY;  Service: Pulmonary;  Laterality: Left;   VIDEO BRONCHOSCOPY WITH ENDOBRONCHIAL NAVIGATION Left 07/30/2020   Procedure: VIDEO BRONCHOSCOPY WITH ENDOBRONCHIAL NAVIGATION;  Surgeon: Brenna Adine CROME, DO;  Location: [facility] ENDOSCOPY;  Service: Pulmonary;  Laterality: Left;     reports that he quit smoking about 7 years ago. His smoking use included cigarettes. He started smoking about 42 years ago. He has a 35 pack-year smoking history. He has been exposed to tobacco smoke. He has never used smokeless tobacco. He reports that he does not drink alcohol  and does not use drugs.  Allergies  Allergen Reactions   Beet [Beta Vulgaris] Nausea And Vomiting   Demerol [Meperidine] Nausea And Vomiting and Other (See Comments)    Made the patient violently sick   Nsaids Other (See Comments)    Contraindication due to CKD    Zocor [Simvastatin] Nausea And Vomiting and Other (See Comments)    Made patient feel very jittery, also   Liver Nausea And Vomiting and Other (See Comments)    ANY Liver!!    Family History  Problem Relation Age of Onset   Dementia Father     Prior to Admission medications   Medication Sig Start Date End Date Taking? Authorizing Provider  acetaminophen  (TYLENOL ) 500 MG tablet Take 1,000 mg by mouth every 6 (six) hours as needed for  mild pain (pain score 1-3), moderate pain (pain score 4-6) or headache.    [provider]  albuterol  (PROVENTIL ) (2.5 MG/3ML) 0.083% nebulizer solution Take 2.5 mg by nebulization every 6 (six) hours as needed for wheezing or shortness of breath.    [provider]  albuterol  (VENTOLIN  HFA) 108 (90 Base) MCG/ACT inhaler Inhale 2 puffs into the lungs every 4 (four) hours as needed for wheezing or shortness of breath. 11/04/21   Cheryle Page, MD  aspirin  EC 81 MG tablet Take 81 mg by mouth daily. Swallow whole.    [provider]  bisacodyl  (DULCOLAX) 5 MG EC tablet Take 2 tablets (10 mg total) by mouth daily as needed for moderate constipation. Patient taking differently: Take 10 mg by mouth daily at 12 noon. 01/26/23   Armenta Canning, MD  BLACK CURRANT SEED OIL PO Take 2 capsules by mouth in the morning.    [provider]  buPROPion  (WELLBUTRIN  XL) 300 MG 24 hr tablet Take 300 mg by mouth every morning. 02/05/20   [provider]  busPIRone  (BUSPAR ) 15 MG tablet Take 15 mg by  mouth in the morning.    [provider]  Calcium  Carbonate (CALCIUM  500 PO) Take 500 mg by mouth in the morning.    [provider]  Carboxymethylcellulose Sod PF 0.5 % SOLN Place 1 drop into both eyes 4 (four) times daily as needed (dry eyes).    [provider]  cholecalciferol  (VITAMIN D3) 25 MCG (1000 UNIT) tablet Take 1,000 Units by mouth daily.    [provider]  clobetasol  ointment (TEMOVATE ) 0.05 % Apply 1 Application topically 2 (two) times daily. For ankles and feet    [provider]  Cyanocobalamin (VITAMIN B12 PO) Take 1 tablet by mouth in the morning.    [provider]  cyclobenzaprine  (FLEXERIL ) 10 MG tablet Take 20 mg by mouth at bedtime.    [provider]  cycloSPORINE  (RESTASIS ) 0.05 % ophthalmic emulsion Place 1 drop into both eyes every 12 (twelve) hours.    [provider]  diltiazem   (CARDIZEM  CD) 120 MG 24 hr capsule Take 1 capsule (120 mg total) by mouth daily. 11/28/23 02/26/24  Barbarann Nest, MD  docusate sodium  (COLACE) 100 MG capsule Take 100-200 mg by mouth 2 (two) times daily. Take two in am. Take one in pm.    [provider]  DULoxetine  (CYMBALTA ) 60 MG capsule Take 60 mg by mouth 2 (two) times daily.    [provider]  ferrous sulfate  325 (65 FE) MG tablet Take 325 mg by mouth daily with breakfast.    [provider]  fluticasone  (FLONASE ) 50 MCG/ACT nasal spray Place 2 sprays into both nostrils in the morning.    [provider]  gabapentin  (NEURONTIN ) 100 MG capsule Take 100 mg by mouth 3 (three) times daily.    [provider]  GARLIC OIL PO Take 1 capsule by mouth in the morning.    [provider]  Glucerna (GLUCERNA) LIQD Take 237 mLs by mouth daily at 12 noon. Glucerna Therapeutic Shake chocolate- Drink 1 bottle by mouth once a day    [provider]  guaiFENesin  (MUCINEX ) 600 MG 12 hr tablet Take 1 tablet (600 mg total) by mouth 2 (two) times daily as needed for cough or to loosen phlegm. 11/28/23   Barbarann Nest, MD  HYDROcodone -acetaminophen  (NORCO) 10-325 MG tablet Take 1 tablet by mouth every 6 (six) hours as needed for moderate pain (pain score 4-6).    [provider]  KRILL OIL PO Take 2 capsules by mouth daily.    [provider]  lactulose  (CHRONULAC ) 10 GM/15ML solution Take 20 g by mouth 2 (two) times daily as needed for mild constipation.    [provider]  lidocaine  (XYLOCAINE ) 5 % ointment Apply 1 Application topically 2 (two) times daily as needed (for pain- affected sites).    [provider]  magnesium  citrate SOLN Take 0.5 Bottles by mouth daily as needed for severe constipation.    [provider]  Magnesium  Oxide (MAG-OXIDE PO) Take 1 tablet by mouth daily.    [provider]  Melatonin 3 MG CAPS Take 6 mg by mouth at  bedtime.    [provider]  Misc Natural Products (TURMERIC CURCUMIN) CAPS Take 3 capsules by mouth See admin instructions. Turmeric/Bioperine/Ginger/Garlic- Take 3 capsules by mouth once a day    [provider]  Multiple Vitamin (MULTIVITAMIN) tablet Take 1 tablet by mouth daily with breakfast.    [provider]  omeprazole  (PRILOSEC) 20 MG capsule Take 1 capsule (20  mg total) by mouth daily. 01/26/23   Armenta Canning, MD  Oxcarbazepine  (TRILEPTAL ) 300 MG tablet Take 900 mg by mouth 2 (two) times daily.    [provider]  OXYGEN  Inhale 6 L/min into the lungs continuous.    [provider]  polyethylene glycol powder (GLYCOLAX /MIRALAX ) 17 GM/SCOOP powder Take 17 g by mouth in the morning.    [provider]  predniSONE  (DELTASONE ) 10 MG tablet Take 3 tablets (30 mg total) by mouth daily for 3 days, THEN 2 tablets (20 mg total) daily for 3 days, THEN 1 tablet (10 mg total) daily for 3 days. 12/07/23 12/16/23  Pahwani, Ravi, MD  senna (SENOKOT) 8.6 MG TABS tablet Take 2 tablets (17.2 mg total) by mouth 2 (two) times daily. Patient taking differently: Take 1 tablet by mouth daily. 05/23/23   Will Almarie MATSU, MD  tamsulosin  (FLOMAX ) 0.4 MG CAPS capsule Take 0.8 mg by mouth in the morning.    [provider]  Tiotropium Bromide -Olodaterol 2.5-2.5 MCG/ACT AERS Inhale 2 puffs into the lungs every morning.    [provider]  traZODone  (DESYREL ) 50 MG tablet Take 75 mg by mouth at bedtime.    [provider]    Physical Exam: Vitals:   12/12/23 2100 12/12/23 2200 12/12/23 2300 12/12/23 2329  BP: 136/80 (!) 149/82 (!) 149/82   Pulse: (!) 104 (!) 102 95   Resp: (!) 23  19   Temp:    (!) 97.5 F (36.4 C)  TempSrc:    Oral  SpO2: 91% 97% 98%   Weight:      Height:        Physical Exam Vitals reviewed.  Constitutional:      General: He is not in acute distress. HENT:     Head: Normocephalic and atraumatic.   Eyes:     Extraocular Movements: Extraocular movements intact.  Cardiovascular:     Rate and Rhythm: Normal rate and regular rhythm.     Heart sounds: Normal heart sounds.  Pulmonary:     Effort: Pulmonary effort is normal. No respiratory distress.     Breath sounds: No stridor. No wheezing, rhonchi or rales.  Abdominal:     General: Bowel sounds are normal.     Palpations: Abdomen is soft.     Tenderness: There is no abdominal tenderness. There is no guarding.  Musculoskeletal:     Cervical back: Normal range of motion.     Right lower leg: No edema.     Left lower leg: No edema.  Skin:    General: Skin is warm and dry.  Neurological:     General: No focal deficit present.     Mental Status: He is alert and oriented to person, place, and time.     Labs on Admission: I have personally reviewed following labs and imaging studies  CBC: Recent Labs  Lab 12/08/23 0305 12/12/23 1944  WBC 17.8* 16.2*  NEUTROABS 13.0*  --   HGB 14.6 13.9  HCT 46.3 44.2  MCV 91.3 91.9  PLT 370 357   Basic Metabolic Panel: Recent Labs  Lab 12/07/23 0538 12/07/23 0828 12/08/23 0305 12/12/23 1944  NA  --  136 135 136  K  --  4.8 4.3 4.7  CL  --  97* 97* 99  CO2  --  32 27 25  GLUCOSE  --  106* 130* 276*  BUN  --  15 19 18   CREATININE 0.94 0.94 0.99 0.89  CALCIUM   --  9.3 9.8 8.7*   GFR: Estimated Creatinine Clearance: 106.9 mL/min (by C-G formula based on SCr of 0.89 mg/dL). Liver Function Tests: Recent Labs  Lab 12/08/23 0305  AST 20  ALT 22  ALKPHOS 88  BILITOT 0.2  PROT 7.4  ALBUMIN 4.2   No results for input(s): LIPASE, AMYLASE in the last 168 hours. No results for input(s): AMMONIA in the last 168 hours. Coagulation Profile: No results for input(s): INR, PROTIME in the last 168 hours. Cardiac Enzymes: No results for input(s): CKTOTAL, CKMB, CKMBINDEX, TROPONINI in the last 168 hours. BNP (last 3 results) Recent Labs    11/30/23 0300  12/07/23 0828 12/12/23 1944  PROBNP 127.0 52.1 <50.0   HbA1C: No results for input(s): HGBA1C in the last 72 hours. CBG: Recent Labs  Lab 12/08/23 2137 12/09/23 0726 12/09/23 1140 12/09/23 1615 12/12/23 1949  GLUCAP 129* 182* 157* 207* 275*   Lipid Profile: No results for input(s): CHOL, HDL, LDLCALC, TRIG, CHOLHDL, LDLDIRECT in the last 72 hours. Thyroid  Function Tests: No results for input(s): TSH, T4TOTAL, FREET4, T3FREE, THYROIDAB in the last 72 hours. Anemia Panel: No results for input(s): VITAMINB12, FOLATE, FERRITIN, TIBC, IRON, RETICCTPCT in the last 72 hours. Urine analysis:    Component Value Date/Time   COLORURINE YELLOW 12/08/2023 0917   APPEARANCEUR CLEAR 12/08/2023 0917   LABSPEC >1.046 (H) 12/08/2023 0917   PHURINE 6.0 12/08/2023 0917   GLUCOSEU NEGATIVE 12/08/2023 0917   HGBUR NEGATIVE 12/08/2023 0917   BILIRUBINUR NEGATIVE 12/08/2023 0917   KETONESUR NEGATIVE 12/08/2023 0917   PROTEINUR NEGATIVE 12/08/2023 0917   UROBILINOGEN 1.0 01/12/2013 0024   NITRITE NEGATIVE 12/08/2023 0917   LEUKOCYTESUR NEGATIVE 12/08/2023 0917    Radiological Exams on Admission: DG Chest 2 View Result Date: 12/12/2023 EXAM: 2 VIEW(S) XRAY OF THE CHEST 12/12/2023 08:15:00 PM COMPARISON: Chest x-ray 11/30/2023 and chest CT 12/08/2023. CLINICAL HISTORY: Shortness of breath. Per chart - Patient presents from home due to shortness of breath which began around 1100 today and has progressively gotten worse since. When fire arrived, they placed him on 8 L and SPO2 was 86%. EMS placed him on a non re breather followed by a duo ; neb. HX: Stage 4 Lung cancer. FINDINGS: LUNGS AND PLEURA: Bilateral left mid and lower lung consolidation and right lower lung consolidation have not significantly changed. No pulmonary edema. No significant pleural effusion or pneumothorax. HEART AND MEDIASTINUM: The heart is mildly enlarged, unchanged. BONES AND SOFT TISSUES: No  acute osseous abnormality. IMPRESSION: 1. Bilateral left mid and lower lung consolidation and right lower lung consolidation, not significantly changed. 2. Mild cardiomegaly, unchanged. Electronically signed by: Greig Pique MD 12/12/2023 08:23 PM EDT RP Workstation: [id number]    Assessment and Plan  End-stage COPD Chronic hypoxemic respiratory failure on 8 L home oxygen  No change in oxygen  requirement from baseline.  No respiratory distress.  He is not wheezing on exam.  No elevation of proBNP.  Troponin mildly elevated but stable, pattern not consistent with ACS.  Patient is not endorsing chest pain.  Chest x-ray showing no acute findings.  Showing bilateral left mid and lower lung consolidation and right lower lobe consolidation which is not significantly changed compared to previous imaging.  Per review of chart, he was evaluated by pulmonology on 11/24/2023 and his chest x-ray findings were felt to be chronic.  He had 2 recent CT angiogram chest done on 10/2 and 10/10 and both times negative for PE.  WBC count chronically elevated in the setting of  recurrent hospitalizations for COPD exacerbation/steroid use.  Leukocytosis is not any worse compared to labs 5 days ago.  He is afebrile.  No increased sputum production from baseline.  Do not think antibiotics are indicated at this time.  ED physician had spoken to the patient but he refused to go home and demanded hospital admission.  When I informed him that he will be admitted for observation, he became upset and stated he plans on staying in the hospital much longer.  Per review of chart, he does have history of malingering behavior.  He is requesting evaluation by physical therapy but also clearly stating that he will not go to a nursing facility if PT recommends. -Respiratory viral panel ordered -Check procalcitonin -Prednisone  40 mg daily x 5 days -Placed on Brovana + Incruse Ellipta  which is hospital formulary replacement for his home maintenance  inhaler. -Albuterol  neb PRN -Mucinex  PRN -Palliative care consulted for goals of care discussion given end-stage COPD and frequent hospitalizations.  Metastatic lung adenocarcinoma Per review of recent hospital discharge summary from 12/07/2023: Patient was to be evaluated locally by oncology for possible systemic treatment but declined and requested transfer of care and follow up at the [facility], it is unclear whether or not he underwent any treatment or evaluation at their facility. **No show note on Jul 11, 2023 at the [facility]- no notes after this date -Outpatient follow-up  Type 2 diabetes Steroid-induced hyperglycemia Last hemoglobin A1c 6.0 on 11/30/2023.  Placed on sliding scale insulin  ACHS.  Hypertension BPH GERD Mood disorder Chronic pain syndrome/neuropathy Chronic constipation Continue home medications.  DVT prophylaxis: Lovenox  Code Status: DNR/DNI (discussed with the patient) Level of care: Progressive Care Unit Admission status: It is my clinical opinion that referral for OBSERVATION is reasonable and necessary in this patient based on the above information provided. The aforementioned taken together are felt to place the patient at high risk for further clinical deterioration. However, it is anticipated that the patient may be medically stable for discharge from the hospital within 24 to 48 hours.  Mario Ram MD [facility]  If 7PM-7AM, please contact night-coverage [email or web address]  12/13/2023, 1:07 AM

## 2023-12-14 DIAGNOSIS — Z923 Personal history of irradiation: Secondary | ICD-10-CM | POA: Diagnosis not present

## 2023-12-14 DIAGNOSIS — Z9981 Dependence on supplemental oxygen: Secondary | ICD-10-CM | POA: Diagnosis not present

## 2023-12-14 DIAGNOSIS — Z9181 History of falling: Secondary | ICD-10-CM | POA: Diagnosis not present

## 2023-12-14 DIAGNOSIS — E0965 Drug or chemical induced diabetes mellitus with hyperglycemia: Secondary | ICD-10-CM | POA: Diagnosis present

## 2023-12-14 DIAGNOSIS — Z743 Need for continuous supervision: Secondary | ICD-10-CM | POA: Diagnosis not present

## 2023-12-14 DIAGNOSIS — Z9151 Personal history of suicidal behavior: Secondary | ICD-10-CM | POA: Diagnosis not present

## 2023-12-14 DIAGNOSIS — Z7982 Long term (current) use of aspirin: Secondary | ICD-10-CM | POA: Diagnosis not present

## 2023-12-14 DIAGNOSIS — Z85828 Personal history of other malignant neoplasm of skin: Secondary | ICD-10-CM | POA: Diagnosis not present

## 2023-12-14 DIAGNOSIS — R0602 Shortness of breath: Secondary | ICD-10-CM | POA: Diagnosis not present

## 2023-12-14 DIAGNOSIS — K219 Gastro-esophageal reflux disease without esophagitis: Secondary | ICD-10-CM | POA: Diagnosis present

## 2023-12-14 DIAGNOSIS — I10 Essential (primary) hypertension: Secondary | ICD-10-CM | POA: Diagnosis present

## 2023-12-14 DIAGNOSIS — N400 Enlarged prostate without lower urinary tract symptoms: Secondary | ICD-10-CM | POA: Diagnosis present

## 2023-12-14 DIAGNOSIS — T380X5A Adverse effect of glucocorticoids and synthetic analogues, initial encounter: Secondary | ICD-10-CM | POA: Diagnosis present

## 2023-12-14 DIAGNOSIS — I252 Old myocardial infarction: Secondary | ICD-10-CM | POA: Diagnosis not present

## 2023-12-14 DIAGNOSIS — Z87891 Personal history of nicotine dependence: Secondary | ICD-10-CM | POA: Diagnosis not present

## 2023-12-14 DIAGNOSIS — J449 Chronic obstructive pulmonary disease, unspecified: Secondary | ICD-10-CM | POA: Diagnosis not present

## 2023-12-14 DIAGNOSIS — G894 Chronic pain syndrome: Secondary | ICD-10-CM | POA: Diagnosis present

## 2023-12-14 DIAGNOSIS — J441 Chronic obstructive pulmonary disease with (acute) exacerbation: Secondary | ICD-10-CM | POA: Diagnosis present

## 2023-12-14 DIAGNOSIS — Z86711 Personal history of pulmonary embolism: Secondary | ICD-10-CM | POA: Diagnosis not present

## 2023-12-14 DIAGNOSIS — Z66 Do not resuscitate: Secondary | ICD-10-CM | POA: Diagnosis present

## 2023-12-14 DIAGNOSIS — E66812 Obesity, class 2: Secondary | ICD-10-CM | POA: Diagnosis present

## 2023-12-14 DIAGNOSIS — J90 Pleural effusion, not elsewhere classified: Secondary | ICD-10-CM | POA: Diagnosis not present

## 2023-12-14 DIAGNOSIS — C7800 Secondary malignant neoplasm of unspecified lung: Secondary | ICD-10-CM | POA: Diagnosis not present

## 2023-12-14 DIAGNOSIS — C3490 Malignant neoplasm of unspecified part of unspecified bronchus or lung: Secondary | ICD-10-CM | POA: Diagnosis present

## 2023-12-14 DIAGNOSIS — K5909 Other constipation: Secondary | ICD-10-CM | POA: Diagnosis present

## 2023-12-14 DIAGNOSIS — Z515 Encounter for palliative care: Secondary | ICD-10-CM | POA: Diagnosis not present

## 2023-12-14 DIAGNOSIS — F319 Bipolar disorder, unspecified: Secondary | ICD-10-CM | POA: Diagnosis present

## 2023-12-14 DIAGNOSIS — J9621 Acute and chronic respiratory failure with hypoxia: Secondary | ICD-10-CM | POA: Diagnosis present

## 2023-12-14 DIAGNOSIS — Z1152 Encounter for screening for COVID-19: Secondary | ICD-10-CM | POA: Diagnosis not present

## 2023-12-14 LAB — BASIC METABOLIC PANEL WITH GFR
Anion gap: 9 (ref 5–15)
BUN: 17 mg/dL (ref 8–23)
CO2: 28 mmol/L (ref 22–32)
Calcium: 9.2 mg/dL (ref 8.9–10.3)
Chloride: 99 mmol/L (ref 98–111)
Creatinine, Ser: 0.99 mg/dL (ref 0.61–1.24)
GFR, Estimated: >60 mL/min (ref >60)
Glucose, Bld: 172 mg/dL — ABNORMAL HIGH (ref 70–99)
Potassium: 4.4 mmol/L (ref 3.5–5.1)
Sodium: 137 mmol/L (ref 135–145)

## 2023-12-14 LAB — GLUCOSE, CAPILLARY
Glucose-Capillary: 110 mg/dL — ABNORMAL HIGH (ref 70–99)
Glucose-Capillary: 175 mg/dL — ABNORMAL HIGH (ref 70–99)
Glucose-Capillary: 204 mg/dL — ABNORMAL HIGH (ref 70–99)
Glucose-Capillary: 226 mg/dL — ABNORMAL HIGH (ref 70–99)

## 2023-12-14 MED ORDER — POLYVINYL ALCOHOL 1.4 % OP SOLN
1.0000 [drp] | OPHTHALMIC | Status: DC | PRN
  Administered 2023-12-15: 1 [drp] via OPHTHALMIC
  Filled 2023-12-14: qty 15

## 2023-12-14 MED ORDER — BENZONATATE 100 MG PO CAPS
100.0000 mg | ORAL_CAPSULE | Freq: Three times a day (TID) | ORAL | Status: AC | PRN
Start: 2023-12-14 — End: ?
  Administered 2023-12-14 – 2023-12-15 (×2): 100 mg via ORAL
  Filled 2023-12-14 (×2): qty 1

## 2023-12-14 MED ORDER — METHYLPREDNISOLONE SODIUM SUCC 40 MG IJ SOLR
40.0000 mg | Freq: Two times a day (BID) | INTRAMUSCULAR | Status: DC
  Administered 2023-12-14 – 2023-12-15 (×3): 40 mg via INTRAVENOUS
  Filled 2023-12-14 (×3): qty 1

## 2023-12-14 MED ORDER — SODIUM CHLORIDE 0.9 % IV SOLN
2.0000 g | Freq: Three times a day (TID) | INTRAVENOUS | Status: DC
  Administered 2023-12-14 – 2023-12-15 (×4): 2 g via INTRAVENOUS
  Filled 2023-12-14 (×4): qty 12.5

## 2023-12-14 MED ORDER — AZITHROMYCIN 500 MG PO TABS
500.0000 mg | ORAL_TABLET | Freq: Every day | ORAL | Status: AC
  Administered 2023-12-14: 500 mg via ORAL
  Filled 2023-12-14: qty 1

## 2023-12-14 MED ORDER — AZITHROMYCIN 250 MG PO TABS
250.0000 mg | ORAL_TABLET | Freq: Every day | ORAL | Status: DC
  Administered 2023-12-15: 250 mg via ORAL
  Filled 2023-12-14: qty 1

## 2023-12-14 MED ORDER — GUAIFENESIN ER 600 MG PO TB12
1200.0000 mg | ORAL_TABLET | Freq: Two times a day (BID) | ORAL | Status: DC
  Administered 2023-12-14 – 2023-12-15 (×3): 1200 mg via ORAL
  Filled 2023-12-14 (×3): qty 2

## 2023-12-14 NOTE — Evaluation (Signed)
 Physical Therapy Evaluation Patient Details Name: Mario [name] MRN: [id number] DOB: Sep 15, 1950 Today's Date: 12/14/2023  History of Present Illness  Mario [name] is a 73 y.o. male admitted 12/12/23 with COPD excaerbation with medical history significant of end-stage COPD with multiple recent hospitalizations, chronic hypoxemic respiratory failure on 8 L home oxygen , metastatic lung adenocarcinoma status post SBRT, malignant pleural effusion, left lower lobe radiation fibrosis, type 2 diabetes with peripheral neuropathy, class II obesity (BMI 36.34), aortic atherosclerosis, OSA noncompliant with CPAP, hypertension, history of PE for which he was on Eliquis  during his admission in February 2025 which was stopped during that time due to ongoing hemoptysis and epistaxis, ankylosing spondylitis, GERD, BPH, anxiety/depression/bipolar disorder with prior history of suicide attempt.  Clinical Impression  Pt admitted with above diagnosis. Pt sitting edge of bed and agreeable to session. Pt placed on 8L O2 via tank and HFNC, amb at supervision A without AD x12ft with progressive shortness of breath. He sits edge of bed and O2 monitored with low 83% before recovering to 90%+. Pt HR reaches 123 then returns to low 100s with recovery. Pt currently with functional limitations due to the deficits listed below (see PT Problem List). Pt will benefit from acute skilled PT to increase their independence and safety with mobility to allow discharge.           If plan is discharge home, recommend the following: A little help with bathing/dressing/bathroom;Assistance with cooking/housework;Assist for transportation;Help with stairs or ramp for entrance   Can travel by private vehicle        Equipment Recommendations None recommended by PT  Recommendations for Other Services       Functional Status Assessment Patient has had a recent decline in their functional status and demonstrates the ability to make  significant improvements in function in a reasonable and predictable amount of time.     Precautions / Restrictions Precautions Precautions: Fall Recall of Precautions/Restrictions: Intact Precaution/Restrictions Comments: pt has his own pulse oximeter, baseline 8L at home Restrictions Weight Bearing Restrictions Per Provider Order: No      Mobility  Bed Mobility Overal bed mobility: Independent             General bed mobility comments: pt sitting EOB on arrival and departure    Transfers Overall transfer level: Modified independent Equipment used: None Transfers: Sit to/from Stand Sit to Stand: Supervision           General transfer comment: good safety awareness and mindful of lines and monitoring SPO2    Ambulation/Gait Ambulation/Gait assistance: Supervision Gait Distance (Feet): 60 Feet Assistive device: None Gait Pattern/deviations: Step-through pattern, Decreased stride length, Narrow base of support Gait velocity: dec     General Gait Details: Pt able to demonstrate reciprocal pattern with upright trunk position. He has progressive SOB but good safety awareness with activity tolerance limitations.  Stairs            Wheelchair Mobility     Tilt Bed    Modified Rankin (Stroke Patients Only)       Balance Overall balance assessment: Mild deficits observed, not formally tested                                           Pertinent Vitals/Pain Pain Assessment Pain Assessment: No/denies pain Pain Intervention(s): Monitored during session, Limited activity within patient's tolerance    Home  Living Family/patient expects to be discharged to:: Private residence Living Arrangements: Non-relatives/Friends Available Help at Discharge: Available 24 hours/day Type of Home: House Home Access: Level entry     Alternate Level Stairs-Number of Steps: Split Level : enter on main level with kitchen/living  up 5 steps to bedroom  and bathroom Home Layout: Two level Home Equipment: Cane - single point;Shower Counsellor (2 wheels);Rollator (4 wheels);Other (comment);Hand held shower head;Adaptive equipment Additional Comments: housemate lives across the hall helps too; [place] aide comes 5 hours/day 3 days/week helps with cleaning; fleeta for transportation (though recently couldn't walk to the [employer]). On 8L O2 at home    Prior Function Prior Level of Function : Needs assist       Physical Assist : ADLs (physical)   ADLs (physical): Dressing;IADLs Mobility Comments: Ambulates in home; gets SOB easily, seated rest breaks to recover; walks with AD; no falls in past 6 months. On 8L O2 at home. ADLs Comments: sits to shower, showering is very taxing; aide helps with dressing, pt showers independently with Aide nearby for safety. Housemate does havey housework and Aide does light housework. Aide does meal prep and laundry but cannot provide transportation.     Extremity/Trunk Assessment   Upper Extremity Assessment Upper Extremity Assessment: Overall WFL for tasks assessed    Lower Extremity Assessment Lower Extremity Assessment: Generalized weakness       Communication   Communication Communication: No apparent difficulties    Cognition Arousal: Alert Behavior During Therapy: WFL for tasks assessed/performed   PT - Cognitive impairments: No apparent impairments                         Following commands: Intact       Cueing Cueing Techniques: Verbal cues     General Comments General comments (skin integrity, edema, etc.): on 8L O2 via HFNC and has desat with return to room, low 83% O2 and HR reaches 123bpm before O2 and HR improve. resting HR before amb 100bpm    Exercises     Assessment/Plan    PT Assessment Patient needs continued PT services  PT Problem List Decreased activity tolerance;Decreased mobility       PT Treatment Interventions Gait training;Therapeutic  exercise;Therapeutic activities;Functional mobility training;Patient/family education;Balance training;Stair training    PT Goals (Current goals can be found in the Care Plan section)  Acute Rehab PT Goals Patient Stated Goal: return home, be able to do stairs and walk household distances without getting so short of breath PT Goal Formulation: With patient Time For Goal Achievement: 12/28/23 Potential to Achieve Goals: Fair    Frequency Min 3X/week     Co-evaluation               AM-PAC PT 6 Clicks Mobility  Outcome Measure Help needed turning from your back to your side while in a flat bed without using bedrails?: None Help needed moving from lying on your back to sitting on the side of a flat bed without using bedrails?: None Help needed moving to and from a bed to a chair (including a wheelchair)?: None Help needed standing up from a chair using your arms (e.g., wheelchair or bedside chair)?: None Help needed to walk in hospital room?: A Little Help needed climbing 3-5 steps with a railing? : A Little 6 Click Score: 22    End of Session Equipment Utilized During Treatment: Oxygen  (8L on tank and 9L in room) Activity Tolerance: Patient tolerated treatment  well Patient left: in bed;with call bell/phone within reach Nurse Communication: Mobility status PT Visit Diagnosis: Difficulty in walking, not elsewhere classified (R26.2);Muscle weakness (generalized) (M62.81)    Time: 8769-8751 PT Time Calculation (min) (ACUTE ONLY): 18 min   Charges:   PT Evaluation $PT Eval Low Complexity: 1 Low   PT General Charges $$ ACUTE PT VISIT: 1 Visit         Stann, PT Acute Rehabilitation Services Office: [phone] 12/14/2023   Stann DELENA Ohara 12/14/2023, 1:08 PM

## 2023-12-14 NOTE — Progress Notes (Signed)
 PROGRESS NOTE    Mario [name]  FMW:[id number] DOB: 04-12-1950 DOA: 12/12/2023 PCP: [name]   Brief Narrative: 73 year old with past medical history significant for end-stage COPD with multiple recent hospitalization, chronic hypoxic respiratory failure on 8 L of oxygen , metastatic [place] cancer status post SBRT, malignant pleural effusion, left lower lobe radiation with fibrosis, diabetes type 2, peripheral neuropathy, class II obesity, OSA noncompliant with CPAP, hypertension, history of PE was on Eliquis  until stopped due to ongoing hemoptysis, , ankylosing spondylitis, GERD, BPH, anxiety/depression/bipolar disorder with prior history of suicide attempt and also has history of malingering behavior.   Patient presents complaining of shortness of breath, continued to have cough.  Patient was placed IV Solu-Medrol .  Chest x-ray no need show bilateral left mid and lower lung consolidation and right lower lobe consolidation which is not significantly changed.  Assessment & Plan:   Principal Problem:   COPD exacerbation (HCC) Active Problems:   DM2 (diabetes mellitus, type 2) (HCC)   Chronic pain   BPH (benign prostatic hyperplasia)   Mood disorder   1-End-stage COPD Acute on Chronic hypoxic respiratory failure on 8 L of oxygen  - Chest x-ray no acute, show bilateral left mid and lower lung consolidation and right lower lobe consolidation. - He has had 2 recent  CT angio; 10/02 and 10/10 both negative for PE. - Agree with palliative care consultation, for symptom management -Continue Brovana , start Pulmicort .  Scheduled DuoNebs, refused Incruse -COVID and respiratory panel negative -Increased oxygen  requirement today, increase to 10 L. Persistent cough while I was in the room.  -plan to resume IV steroids, unable to ruled out infection on chronic lung findings, resume IV antibiotics.   Metastatic lung adenocarcinoma - Patient will need to follow-up with oncology of  choice - Palliative care consulted, home with palliative care follow up . Patient is not ready fro hospice.   Diabetes type 2 Steroid-induced hyperglycemia - Sliding scale insulin   Hypertension; Continue Cardizem  BPH; continue Flomax  GERD; continue PPI Mood disorder; continue trazodone , gabapentin , Cymbalta , BuSpar  Chronic pain syndrome/neuropathy Chronic constipation  Estimated body mass index is 36.34 kg/m as calculated from the following:   Height as of this encounter: 6' 2 (1.88 m).   Weight as of this encounter: 128.4 kg.   DVT prophylaxis: Lovenox  Code Status: DNR Family Communication: Care discussed with patient Disposition Plan:  Status is: Observation The patient remains OBS appropriate and will d/c before 2 midnights.    Consultants:  Palliative care  Procedures:  None Antimicrobials:    Subjective: Persist cough while I was in the room. Report dyspnea, not feeling better.   Objective: Vitals:   12/14/23 0503 12/14/23 0952 12/14/23 1345 12/14/23 1405  BP: (!) 142/71   (!) 150/62  Pulse: 82   (!) 109  Resp: 14   20  Temp: 97.8 F (36.6 C)   98.2 F (36.8 C)  TempSrc: Oral   Oral  SpO2: 100% 95% 93% 93%  Weight:      Height:        Intake/Output Summary (Last 24 hours) at 12/14/2023 1417 Last data filed at 12/14/2023 0700 Gross per 24 hour  Intake 240 ml  Output 1500 ml  Net -1260 ml   Filed Weights   12/12/23 1921  Weight: 128.4 kg    Examination:  General exam: NAD Respiratory system: BL wheezing Cardiovascular system: S 1, S 2 RRR Gastrointestinal system: BS present, soft nt Central nervous system: non focal.  Extremities: Symmetric 5 x 5  power.    Data Reviewed: I have personally reviewed following labs and imaging studies  CBC: Recent Labs  Lab 12/08/23 0305 12/12/23 1944 12/13/23 0533  WBC 17.8* 16.2* 11.7*  NEUTROABS 13.0*  --   --   HGB 14.6 13.9 14.6  HCT 46.3 44.2 46.6  MCV 91.3 91.9 94.3  PLT 370 357 349    Basic Metabolic Panel: Recent Labs  Lab 12/08/23 0305 12/12/23 1944 12/14/23 0912  NA 135 136 137  K 4.3 4.7 4.4  CL 97* 99 99  CO2 27 25 28   GLUCOSE 130* 276* 172*  BUN 19 18 17   CREATININE 0.99 0.89 0.99  CALCIUM  9.8 8.7* 9.2   GFR: Estimated Creatinine Clearance: 96.1 mL/min (by C-G formula based on SCr of 0.99 mg/dL). Liver Function Tests: Recent Labs  Lab 12/08/23 0305  AST 20  ALT 22  ALKPHOS 88  BILITOT 0.2  PROT 7.4  ALBUMIN 4.2   No results for input(s): LIPASE, AMYLASE in the last 168 hours. No results for input(s): AMMONIA in the last 168 hours. Coagulation Profile: No results for input(s): INR, PROTIME in the last 168 hours. Cardiac Enzymes: No results for input(s): CKTOTAL, CKMB, CKMBINDEX, TROPONINI in the last 168 hours. BNP (last 3 results) Recent Labs    11/30/23 0300 12/07/23 0828 12/12/23 1944  PROBNP 127.0 52.1 <50.0   HbA1C: No results for input(s): HGBA1C in the last 72 hours. CBG: Recent Labs  Lab 12/13/23 1131 12/13/23 1630 12/13/23 2108 12/14/23 0721 12/14/23 1132  GLUCAP 159* 198* 160* 110* 175*   Lipid Profile: No results for input(s): CHOL, HDL, LDLCALC, TRIG, CHOLHDL, LDLDIRECT in the last 72 hours. Thyroid  Function Tests: No results for input(s): TSH, T4TOTAL, FREET4, T3FREE, THYROIDAB in the last 72 hours. Anemia Panel: No results for input(s): VITAMINB12, FOLATE, FERRITIN, TIBC, IRON, RETICCTPCT in the last 72 hours. Sepsis Labs: Recent Labs  Lab 12/13/23 0533  PROCALCITON <0.10    Recent Results (from the past 240 hours)  Culture, blood (Routine x 2)     Status: None   Collection Time: 12/08/23  3:05 AM   Specimen: BLOOD  Result Value Ref Range Status   Specimen Description   Final    BLOOD RIGHT ANTECUBITAL Performed at [facility], [address], [place], [place] [address]    Special Requests   Final    Blood Culture adequate  volume BOTTLES DRAWN AEROBIC AND ANAEROBIC Performed at [facility], [address] [place], [place] [address]    Culture   Final    NO GROWTH 5 DAYS Performed at [facility], [address] [place], [place] [address]    Report Status 12/13/2023 FINAL  Final  Resp panel by RT-PCR (RSV, Flu A&B, Covid) Anterior Nasal Swab     Status: None   Collection Time: 12/08/23  3:14 AM   Specimen: Anterior Nasal Swab  Result Value Ref Range Status   SARS Coronavirus 2 by RT PCR NEGATIVE NEGATIVE Final    Comment: (NOTE) SARS-CoV-2 target nucleic acids are NOT DETECTED.  The SARS-CoV-2 RNA is generally detectable in upper respiratory specimens during the acute phase of infection. The lowest concentration of SARS-CoV-2 viral copies this assay can detect is 138 copies/mL. A negative result does not preclude SARS-Cov-2 infection and should not be used as the sole basis for treatment or other patient management decisions. A negative result may occur with  improper specimen collection/handling, submission of specimen other than nasopharyngeal swab,  presence of viral mutation(s) within the areas targeted by this assay, and inadequate number of viral copies(<138 copies/mL). A negative result must be combined with clinical observations, patient history, and epidemiological information. The expected result is Negative.  Fact Sheet for Patients:  BloggerCourse.com  Fact Sheet for Healthcare Providers:  SeriousBroker.it  This test is no t yet approved or cleared by the [place]  FDA and  has been authorized for detection and/or diagnosis of SARS-CoV-2 by FDA under an Emergency Use Authorization (EUA). This EUA will remain  in effect (meaning this test can be used) for the duration of the COVID-19 declaration under Section 564(b)(1) of the Act, 21 U.S.C.section 360bbb-3(b)(1), unless the authorization is terminated  or  revoked sooner.       Influenza A by PCR NEGATIVE NEGATIVE Final   Influenza B by PCR NEGATIVE NEGATIVE Final    Comment: (NOTE) The Xpert Xpress SARS-CoV-2/FLU/RSV plus assay is intended as an aid in the diagnosis of influenza from Nasopharyngeal swab specimens and should not be used as a sole basis for treatment. Nasal washings and aspirates are unacceptable for Xpert Xpress SARS-CoV-2/FLU/RSV testing.  Fact Sheet for Patients: BloggerCourse.com  Fact Sheet for Healthcare Providers: SeriousBroker.it  This test is not yet approved or cleared by the [place]  FDA and has been authorized for detection and/or diagnosis of SARS-CoV-2 by FDA under an Emergency Use Authorization (EUA). This EUA will remain in effect (meaning this test can be used) for the duration of the COVID-19 declaration under Section 564(b)(1) of the Act, 21 U.S.C. section 360bbb-3(b)(1), unless the authorization is terminated or revoked.     Resp Syncytial Virus by PCR NEGATIVE NEGATIVE Final    Comment: (NOTE) Fact Sheet for Patients: BloggerCourse.com  Fact Sheet for Healthcare Providers: SeriousBroker.it  This test is not yet approved or cleared by the [place]  FDA and has been authorized for detection and/or diagnosis of SARS-CoV-2 by FDA under an Emergency Use Authorization (EUA). This EUA will remain in effect (meaning this test can be used) for the duration of the COVID-19 declaration under Section 564(b)(1) of the Act, 21 U.S.C. section 360bbb-3(b)(1), unless the authorization is terminated or revoked.  Performed at [facility], [address], [place], [place] [address]   Culture, blood (Routine x 2)     Status: None   Collection Time: 12/08/23  8:11 PM   Specimen: BLOOD RIGHT HAND  Result Value Ref Range Status   Specimen Description   Final    BLOOD RIGHT  HAND Performed at [facility], [address], [place], [place] [address]    Special Requests   Final    BOTTLES DRAWN AEROBIC AND ANAEROBIC Blood Culture adequate volume Performed at [facility], [address] [place], [place] [address]    Culture   Final    NO GROWTH 5 DAYS Performed at [facility], [address], [place], [place] [address]    Report Status 12/13/2023 FINAL  Final  Respiratory (~20 pathogens) panel by PCR     Status: None   Collection Time: 12/13/23  3:20 AM   Specimen: Nasopharyngeal Swab; Respiratory  Result Value Ref Range Status   Adenovirus NOT DETECTED NOT DETECTED Final   Coronavirus 229E NOT DETECTED NOT DETECTED Final    Comment: (NOTE) The Coronavirus on the Respiratory Panel, DOES NOT test for the novel  Coronavirus (2019 nCoV)    Coronavirus HKU1 NOT DETECTED NOT DETECTED Final   Coronavirus NL63  NOT DETECTED NOT DETECTED Final   Coronavirus OC43 NOT DETECTED NOT DETECTED Final   Metapneumovirus NOT DETECTED NOT DETECTED Final   Rhinovirus / Enterovirus NOT DETECTED NOT DETECTED Final   Influenza A NOT DETECTED NOT DETECTED Final   Influenza B NOT DETECTED NOT DETECTED Final   Parainfluenza Virus 1 NOT DETECTED NOT DETECTED Final   Parainfluenza Virus 2 NOT DETECTED NOT DETECTED Final   Parainfluenza Virus 3 NOT DETECTED NOT DETECTED Final   Parainfluenza Virus 4 NOT DETECTED NOT DETECTED Final   Respiratory Syncytial Virus NOT DETECTED NOT DETECTED Final   Bordetella pertussis NOT DETECTED NOT DETECTED Final   Bordetella Parapertussis NOT DETECTED NOT DETECTED Final   Chlamydophila pneumoniae NOT DETECTED NOT DETECTED Final   Mycoplasma pneumoniae NOT DETECTED NOT DETECTED Final    Comment: Performed at [facility], [address], [place], [place] [address]  SARS Coronavirus 2 by RT PCR (hospital order, performed in [facility] hospital lab) *cepheid single result test* Anterior Nasal Swab     Status:  None   Collection Time: 12/13/23  3:20 AM   Specimen: Anterior Nasal Swab  Result Value Ref Range Status   SARS Coronavirus 2 by RT PCR NEGATIVE NEGATIVE Final    Comment: (NOTE) SARS-CoV-2 target nucleic acids are NOT DETECTED.  The SARS-CoV-2 RNA is generally detectable in upper and lower respiratory specimens during the acute phase of infection. The lowest concentration of SARS-CoV-2 viral copies this assay can detect is 250 copies / mL. A negative result does not preclude SARS-CoV-2 infection and should not be used as the sole basis for treatment or other patient management decisions.  A negative result may occur with improper specimen collection / handling, submission of specimen other than nasopharyngeal swab, presence of viral mutation(s) within the areas targeted by this assay, and inadequate number of viral copies (<250 copies / mL). A negative result must be combined with clinical observations, patient history, and epidemiological information.  Fact Sheet for Patients:   RoadLapTop.co.za  Fact Sheet for Healthcare Providers: http://kim-miller.com/  This test is not yet approved or  cleared by the [place]  FDA and has been authorized for detection and/or diagnosis of SARS-CoV-2 by FDA under an Emergency Use Authorization (EUA).  This EUA will remain in effect (meaning this test can be used) for the duration of the COVID-19 declaration under Section 564(b)(1) of the Act, 21 U.S.C. section 360bbb-3(b)(1), unless the authorization is terminated or revoked sooner.  Performed at [facility], [address], [place], [place] [address]          Radiology Studies: DG Chest 2 View Result Date: 12/12/2023 EXAM: 2 VIEW(S) XRAY OF THE CHEST 12/12/2023 08:15:00 PM COMPARISON: Chest x-ray 11/30/2023 and chest CT 12/08/2023. CLINICAL HISTORY: Shortness of breath. Per chart - Patient presents from home due to  shortness of breath which began around 1100 today and has progressively gotten worse since. When fire arrived, they placed him on 8 L and SPO2 was 86%. EMS placed him on a non re breather followed by a duo ; neb. HX: Stage 4 Lung cancer. FINDINGS: LUNGS AND PLEURA: Bilateral left mid and lower lung consolidation and right lower lung consolidation have not significantly changed. No pulmonary edema. No significant pleural effusion or pneumothorax. HEART AND MEDIASTINUM: The heart is mildly enlarged, unchanged. BONES AND SOFT TISSUES: No acute osseous abnormality. IMPRESSION: 1. Bilateral left mid and lower lung consolidation and right lower lung consolidation, not significantly changed. 2. Mild  cardiomegaly, unchanged. Electronically signed by: Greig Pique MD 12/12/2023 08:23 PM EDT RP Workstation: [id number]        Scheduled Meds:  arformoterol   15 mcg Nebulization BID   aspirin  EC  81 mg Oral Daily   [START ON 12/15/2023] azithromycin   250 mg Oral Daily   budesonide  (PULMICORT ) nebulizer solution  0.25 mg Nebulization BID   buPROPion   300 mg Oral q morning   busPIRone   15 mg Oral Daily   cholecalciferol   1,000 Units Oral Daily   cycloSPORINE   1 drop Both Eyes Q12H   diltiazem   120 mg Oral Daily   docusate sodium   100 mg Oral QHS   docusate sodium   200 mg Oral Daily   DULoxetine   60 mg Oral BID   enoxaparin  (LOVENOX ) injection  40 mg Subcutaneous Q24H   ferrous sulfate   325 mg Oral Q breakfast   fluticasone   2 spray Each Nare Daily   gabapentin   100 mg Oral TID   guaiFENesin   1,200 mg Oral BID   insulin  aspart  0-5 Units Subcutaneous QHS   insulin  aspart  0-9 Units Subcutaneous TID WC   ipratropium-albuterol   3 mL Nebulization Q6H   melatonin  6 mg Oral QHS   methylPREDNISolone  (SOLU-MEDROL ) injection  40 mg Intravenous Q12H   Oxcarbazepine   900 mg Oral BID   pantoprazole   40 mg Oral Daily   polyethylene glycol  17 g Oral Daily   tamsulosin   0.8 mg Oral Daily   traZODone   75 mg Oral  QHS   cyanocobalamin  100 mcg Oral Daily   Continuous Infusions:  ceFEPime  (MAXIPIME ) IV 2 g (12/14/23 1307)     LOS: 0 days    Time spent: 35 minutes    Antwion Carpenter A Sheriece Jefcoat, MD [facility]   If 7PM-7AM, please contact night-coverage [email or web address]  12/14/2023, 2:17 PM

## 2023-12-14 NOTE — Progress Notes (Signed)
 Daily Progress Note   Patient Name: Mario [name]       Date: 12/14/2023 DOB: 1951/01/31  Age: 73 y.o. MRN#: [id number] Attending Physician: Madelyne Owen LABOR, MD Primary Care Physician: [name] Admit Date: 12/12/2023  Reason for Consultation/Follow-up: Establishing goals of care  Subjective: Awake alert, denies any acute dyspnea, able to speak in full sentences today.  I'm not ready to die, I'm not giving up.  Length of Stay: 0  Current Medications: Scheduled Meds:   arformoterol   15 mcg Nebulization BID   aspirin  EC  81 mg Oral Daily   budesonide  (PULMICORT ) nebulizer solution  0.25 mg Nebulization BID   buPROPion   300 mg Oral q morning   busPIRone   15 mg Oral Daily   cholecalciferol   1,000 Units Oral Daily   cycloSPORINE   1 drop Both Eyes Q12H   diltiazem   120 mg Oral Daily   docusate sodium   100 mg Oral QHS   docusate sodium   200 mg Oral Daily   DULoxetine   60 mg Oral BID   enoxaparin  (LOVENOX ) injection  40 mg Subcutaneous Q24H   ferrous sulfate   325 mg Oral Q breakfast   fluticasone   2 spray Each Nare Daily   gabapentin   100 mg Oral TID   guaiFENesin   1,200 mg Oral BID   insulin  aspart  0-5 Units Subcutaneous QHS   insulin  aspart  0-9 Units Subcutaneous TID WC   ipratropium-albuterol   3 mL Nebulization Q6H   melatonin  6 mg Oral QHS   methylPREDNISolone  (SOLU-MEDROL ) injection  40 mg Intravenous Q12H   Oxcarbazepine   900 mg Oral BID   pantoprazole   40 mg Oral Daily   polyethylene glycol  17 g Oral Daily   tamsulosin   0.8 mg Oral Daily   traZODone   75 mg Oral QHS   cyanocobalamin  100 mcg Oral Daily    Continuous Infusions:  ceFEPime  (MAXIPIME ) IV      PRN Meds: acetaminophen  **OR** acetaminophen , albuterol , bisacodyl ,  HYDROcodone -acetaminophen , lactulose , oxyCODONE   Physical Exam         Awake alert Sitting by the edge of the bed Regular work of breathing On O2 [place]  Vital Signs: BP (!) 142/71 (BP Location: Left Arm)   Pulse 82   Temp 97.8 F (36.6 C) (Oral)   Resp 14  Ht 6' 2 (1.88 m)   Wt 128.4 kg   SpO2 95%   BMI 36.34 kg/m  SpO2: SpO2: 95 % O2 Device: O2 Device: High Flow Nasal Cannula O2 Flow Rate: O2 Flow Rate (L/min): 9 L/min  Intake/output summary:  Intake/Output Summary (Last 24 hours) at 12/14/2023 1158 Last data filed at 12/14/2023 0700 Gross per 24 hour  Intake 240 ml  Output 1500 ml  Net -1260 ml   LBM: Last BM Date : 12/13/23 Baseline Weight: Weight: 128.4 kg Most recent weight: Weight: 128.4 kg       Palliative Assessment/Data:      Patient Active Problem List   Diagnosis Date Noted   Metastatic non-small cell lung cancer (HCC) 12/02/2023   Multifocal pneumonia 11/30/2023   COPD exacerbation (HCC) 11/24/2023   DNR (do not resuscitate)/DNI(Do not intubate) 05/24/2023   Acute on chronic respiratory failure with hypoxia (HCC) - chronically on 5 L/min home O2. 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 ENT consulted. nasal pack placed. 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 09/21/2020   Constipation    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   Stage 3a chronic kidney disease (HCC) 12/26/2019   COPD (chronic obstructive pulmonary disease) (HCC) - Pt frequently wheezes after physical exertion. This does NOT mean he has a COPD exacerbation.    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   DM2 (diabetes mellitus, type 2) (HCC) 07/16/2017   HLA B27 (HLA B27 positive) 07/16/2017   Malingering 01/21/2013   Tobacco abuse 01/11/2013   Obesity, unspecified 01/11/2013   Hypertension associated with diabetes (HCC) 01/10/2013    Palliative Care Assessment & Plan   Patient Profile:    Assessment:  73 y.o. male  with past medical history of end-stage COPD with recurrent hospitalizations, metastatic lung adenocarcinoma s/p radiation, OSA noncompliant with CPAP, depression with prior suicide attempt admitted on 12/12/2023 with shortness of breath. Palliative consulted for goals of care conversation in setting of end stage COPD and recurrent admissions.   Recommendations/Plan:  Agree with DNR Denies any acute uncontrolled symptoms at present, some baseline shortness of breath, not worse than prior.  Disposition - declines ST SNF wants home w/HHC-accepting of home based palliative services.             Code Status:    Code Status Orders  (From admission, onward)           Start     Ordered   12/13/23 0318  Do not attempt resuscitation (  DNR)- Limited -Do Not Intubate (DNI)  Continuous       Question Answer Comment  If pulseless and not breathing No CPR or chest compressions.   In Pre-Arrest Conditions (Patient Is Breathing and Has A Pulse) Do not  intubate. Provide all appropriate non-invasive medical interventions. Avoid ICU transfer unless indicated or required.   Consent: Discussion documented in EHR or advanced directives reviewed      12/13/23 0318           Code Status History     Date Active Date Inactive Code Status Order ID Comments User Context   12/08/2023 0926 12/09/2023 2207 Limited: Do not attempt resuscitation (DNR) -DNR-LIMITED -Do Not Intubate/DNI  [id number]  Zella Katha HERO, MD ED   11/30/2023 [phone] 12/07/2023 2059 Limited: Do not attempt resuscitation (DNR) -DNR-LIMITED -Do Not Intubate/DNI  [id number]  Vernon Velna SAUNDERS, MD ED   11/30/2023 [phone] 11/30/2023 0904 Limited: Do not attempt resuscitation (DNR) -DNR-LIMITED -Do Not Intubate/DNI  [id number]  Shona Terry SAILOR, DO ED   11/24/2023 0903 11/28/2023 1759 Limited: Do not attempt resuscitation (DNR) -DNR-LIMITED -Do Not Intubate/DNI  [id number]  Barbarann Nest, MD Inpatient   11/24/2023 0325 11/24/2023 0903 Full Code [id number]  Debby Camila LABOR, MD ED   05/18/2023 1549 05/25/2023 1601 Limited: Do not attempt resuscitation (DNR) -DNR-LIMITED -Do Not Intubate/DNI  [id number]  Marcellus Cassondra PARAS, NP ED   05/18/2023 0126 05/18/2023 1549 Full Code [id number]  Franky Redia SAILOR, MD ED   05/05/2023 1950 05/16/2023 1455 Full Code [id number]  Sim Emery CROME, MD Inpatient   04/28/2023 2257 05/05/2023 1610 Full Code [id number]  Tobie Jorie SAUNDERS, MD ED   04/01/2023 2033 04/24/2023 1635 Full Code [id number]  Tobie Jorie SAUNDERS, MD ED   03/17/2023 0419 04/01/2023 1712 Full Code [id number]  Lee Kingfisher, MD ED   10/20/2022 0511 10/21/2022 2108 Full Code [id number]  Laveda Roosevelt, MD ED   10/08/2022 0430 10/09/2022 1857 Full Code [id number]  Carita Senior, MD ED   09/25/2022 1202 10/01/2022 1702 Full Code [id number]  Celinda Alm Lot, MD ED   07/02/2022 1956 07/05/2022 1810 Full Code [id number]  Waddell Rake, MD ED   11/03/2021 0057 11/04/2021 1755 Full Code [id number]  Charlton Evalene RAMAN, MD ED   10/08/2021 1333  10/09/2021 1719 Full Code [id number]  Ruthell Lonni FALCON, PA-C ED   10/01/2021 1932 10/04/2021 1934 Full Code [id number]  Elnor Bernarda SQUIBB, DO ED   09/30/2021 2345 10/01/2021 1822 Full Code [id number]  Trine Raynell Moder, MD ED   09/23/2021 0029 09/26/2021 2237 DNR [id number]  Kenard Zachary PARAS, MD ED   08/25/2021 1020 09/07/2021 1653 DNR [id number]  Tina Tobey Jama SAILOR, NP Inpatient   08/24/2021 0402 08/25/2021 1020 Full Code [id number]  Charlton Evalene RAMAN, MD ED   08/21/2021 0031 08/23/2021 1845 Full Code [id number]  Charlton Evalene RAMAN, MD ED   08/07/2021 0956 08/11/2021 0427 Full Code [id number]  Milissa Tod PARAS, MD ED   08/03/2021 1851 08/06/2021 0348 Full Code [id number]  Juvenal Harlene PENNER, DO Inpatient   06/17/2021 0046 07/06/2021 1726 Full Code [id number]  Ricky Pax T, DO ED   06/04/2021 0139 06/11/2021 1609 Full Code [id number]  Marcene Eva NOVAK, DO ED   05/21/2021 2320 05/25/2021 0549 Full Code [id number]  Rehman, Areeg N, DO ED   05/20/2021 0150 05/21/2021 2025 Full Code [id number]  Rehman, Areeg N, DO ED   02/04/2021 0105 02/04/2021 2154 Full Code [id number]  Tobie Jorie SAUNDERS, MD ED   01/09/2021 0612 01/10/2021 2012 Full  Code [id number]  Alfornia Madison, MD Inpatient   09/04/2020 0923 09/05/2020 1724 Full Code [id number]  Rockey Denece LABOR, DO ED   07/26/2020 1449 08/05/2020 1913 Full Code [id number]  Rockey Denece LABOR, DO Inpatient   04/04/2020 0033 04/07/2020 2141 Full Code [id number]  Franky Redia SAILOR, MD ED   01/04/2020 1615 01/06/2020 1904 Full Code [id number]  Rockey Denece LABOR, DO Inpatient   12/26/2019 1039 12/28/2019 1810 Full Code [id number]  Barbarann Nest, MD ED   07/12/2019 0557 07/15/2019 1904 Full Code [id number]  Lorette Mayo, MD ED   07/03/2019 0016 07/03/2019 2011 Full Code [id number]  Celinda Alm Lot, MD ED   07/03/2019 0016 07/03/2019 0016 Full Code [id number]  Celinda Alm Lot, MD ED   06/23/2019 0425 06/25/2019 2122 Full Code [id number]  Fernand Re Z, DO ED   10/10/2018 2021 10/13/2018 1641 Full Code [id number]  Ward, Ami Copes,  PA-C ED   04/30/2018 1759 05/01/2018 1714 Full Code [id number]  Leotis Sole, PA-C ED   02/20/2018 1759 02/21/2018 1452 Full Code [id number]  Theadore Ozell HERO, MD ED   07/16/2017 0309 07/21/2017 1734 Full Code [id number]  Lonzell Emeline HERO, DO ED   09/17/2016 2107 09/18/2016 2055 Full Code [id number]  Caleen Havens, MD ED   06/27/2016 1523 07/07/2016 1718 Full Code [id number]  Dwan Kyla HERO, PA-C ED   05/23/2014 2356 05/26/2014 1656 Full Code [id number]  Luke Agent, MD Inpatient   01/21/2013 2202 01/23/2013 2202 Full Code [id number]  Gabe Maude RAMAN, PA-C ED   01/12/2013 0020 01/12/2013 0641 Full Code [id number]  Arloa Chroman, PA-C ED   01/10/2013 0122 01/11/2013 2122 Full Code [id number]  Franky Redia SAILOR, MD Inpatient       Prognosis:  < 12 months  Discharge Planning: Home with Palliative Services  Care plan was discussed with patient.   Thank you for allowing the Palliative Medicine Team to assist in the care of this patient. Mod MDM     Greater than 50%  of this time was spent counseling and coordinating care related to the above assessment and plan.  Lonia Serve, MD  Please contact Palliative Medicine Team phone at [phone] for questions and concerns.

## 2023-12-14 NOTE — TOC Initial Note (Signed)
 Transition of Care [facility]) - Initial/Assessment Note    Patient Details  Name: Mario [name] MRN: [id number] Date of Birth: 10-02-1950  Transition [facility]) CM/SW Contact:    Bascom Service, RN Phone Number: 12/14/2023, 11:08 AM  Clinical Narrative: spoke to patient about d/c plans-declines ST SNF wants home w/HHC-already active w/[facility] rep [facility] HHRN/PT/OT;[facility] already active home 02-has travel tank;[facility] will set up otpt palliative care services. Has own transport home.                    Barriers to Discharge: Continued Medical Work up   Patient Goals and CMS Choice Patient states their goals for this hospitalization and ongoing recovery are:: Home CMS Medicare.gov Compare Post Acute Care list provided to:: Patient Choice offered to / list presented to : Patient [place] ownership interest in [facility].provided to:: Patient    Expected Discharge Plan and Services   Discharge Planning Services: CM Consult Post Acute Care Choice: Durable Medical Equipment, Home Health Living arrangements for the past 2 months: Apartment                           HH Arranged: RN, PT, OT (Active [facility] HHRN/PT/OT) HH Agency: [facility] Date [facility] Agency Contacted: 12/14/23 Time HH Agency Contacted: [phone] Representative spoke with at [facility] Agency: [facility]  Prior Living Arrangements/Services Living arrangements for the past 2 months: Apartment Lives with:: Roommate   Do you feel safe going back to the place where you live?: Yes          Current home services: DME, Home PT, Home OT, Home RN ([facility]-home 02-has travel tank;Active [facility]-HHRN/PT/OT)    Activities of Daily Living   ADL Screening (condition at time of admission) Independently performs ADLs?: Yes (appropriate for developmental age) Is the patient deaf or have difficulty hearing?: No Does the patient have difficulty seeing, even when wearing glasses/contacts?:  No Does the patient have difficulty concentrating, remembering, or making decisions?: No  Permission Sought/Granted Permission sought to share information with : Case Manager                Emotional Assessment              Admission diagnosis:  COPD exacerbation (HCC) [J44.1] Patient Active Problem List   Diagnosis Date Noted   Metastatic non-small cell lung cancer (HCC) 12/02/2023   Multifocal pneumonia 11/30/2023   COPD exacerbation (HCC) 11/24/2023   DNR (do not resuscitate)/DNI(Do not intubate) 05/24/2023   Acute on chronic respiratory failure with hypoxia (HCC) - chronically on 5 L/min home O2. 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 ENT consulted. nasal pack  placed. 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 09/21/2020   Constipation    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   Stage 3a chronic kidney disease (HCC) 12/26/2019   COPD (chronic obstructive pulmonary disease) (HCC) - Pt frequently wheezes after physical exertion. This does NOT mean he has a COPD exacerbation.    OSA (obstructive sleep apnea)  06/23/2019   Major depressive disorder, recurrent episode 10/12/2018   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   DM2 (diabetes mellitus, type 2) (HCC) 07/16/2017   HLA B27 (HLA B27 positive) 07/16/2017   Malingering 01/21/2013   Tobacco abuse 01/11/2013   Obesity, unspecified 01/11/2013   Hypertension associated with diabetes (HCC) 01/10/2013   PCP:  [name] Pharmacy:   [facility] - [place], [place] - [address] [place] [address] Phone: [phone] Fax: [phone]  DARRYLE LONG - [facility] [address] [place] [address] Phone: [phone] Fax: [phone]     Social Drivers of Health (SDOH) Social History: SDOH Screenings   Food Insecurity: No Food Insecurity (12/13/2023)  Housing: Low Risk  (12/13/2023)  Transportation Needs: No Transportation Needs (12/13/2023)  Recent Concern: Transportation Needs - Unmet Transportation Needs (11/30/2023)  Utilities: Not At Risk (12/13/2023)  Financial Resource Strain: Low Risk  (09/23/2020)  Social Connections: Socially Isolated (12/13/2023)  Stress: No Stress Concern Present (09/23/2020)  Tobacco Use: Medium Risk (12/12/2023)   SDOH Interventions:     Readmission Risk Interventions    12/03/2023    6:04 PM 11/27/2023    8:36 AM 05/06/2023    3:25 PM  Readmission Risk Prevention Plan  Transportation Screening Complete Complete Complete  Medication Review Oceanographer) Complete Complete Complete  PCP or Specialist appointment within 3-5 days of discharge Complete  Complete  HRI or Home Care Consult Complete Complete Complete  SW Recovery Care/Counseling Consult Complete Complete Complete  Palliative Care Screening Not Applicable Not Applicable Not Applicable  Skilled Nursing Facility Not Applicable Not Applicable Not Applicable

## 2023-12-14 NOTE — Progress Notes (Signed)
   12/14/23 2325  BiPAP/CPAP/SIPAP  BiPAP/CPAP/SIPAP Pt Type Adult  BiPAP/CPAP/SIPAP Resmed  Mask Type Full face mask  Dentures removed? Not applicable  Mask Size Large  EPAP 9 cmH2O  Flow Rate 9 lpm  Patient Home Machine No  Patient Home Mask No  Patient Home Tubing No  Auto Titrate No  Device Plugged into RED Power Outlet Yes

## 2023-12-15 ENCOUNTER — Institutional Professional Consult (permissible substitution)
Admission: AD | Admit: 2023-12-15 | Discharge: 2024-02-29 | Disposition: E | Source: Other Acute Inpatient Hospital | Attending: Internal Medicine | Admitting: Internal Medicine

## 2023-12-15 DIAGNOSIS — J441 Chronic obstructive pulmonary disease with (acute) exacerbation: Secondary | ICD-10-CM | POA: Diagnosis not present

## 2023-12-15 LAB — GLUCOSE, CAPILLARY
Glucose-Capillary: 198 mg/dL — ABNORMAL HIGH (ref 70–99)
Glucose-Capillary: 127 mg/dL — ABNORMAL HIGH (ref 70–99)
Glucose-Capillary: 200 mg/dL — ABNORMAL HIGH (ref 70–99)

## 2023-12-15 MED ORDER — AZITHROMYCIN 250 MG PO TABS: 250.0000 mg | ORAL_TABLET | Freq: Every day | ORAL | 0 refills | Status: AC

## 2023-12-15 MED ORDER — IPRATROPIUM-ALBUTEROL 0.5-2.5 (3) MG/3ML IN SOLN: 3.0000 mL | Freq: Four times a day (QID) | RESPIRATORY_TRACT | 0 refills | Status: DC

## 2023-12-15 MED ADMIN — Hydrocod Polst-Chlorphen Polst ER Susp 10-8 MG/5ML: 5 mL | ORAL | NDC 99999090005

## 2023-12-15 MED FILL — Hydrocod Polst-Chlorphen Polst ER Susp 10-8 MG/5ML: 5.0000 mL | ORAL | Qty: 115 | Status: AC

## 2023-12-15 NOTE — TOC Progression Note (Signed)
 Transition of Care [facility]) - Progression Note    Patient Details  Name: JUMAANE WEATHERFORD MRN: [id number] Date of Birth: [birth year]  [facility]) CM/SW Contact  Raley Novicki, Nathanel, RN Phone Number: 12/15/2023, 12:41 PM  Clinical Narrative:  [place] to patient about [facility]-he agrees rep Kristeen will initiate auth. Await auth.     Expected Discharge Plan: Long Term Acute Care (LTAC) Barriers to Discharge: Insurance Authorization               Expected Discharge Plan and Services   Discharge Planning Services: CM Consult Post Acute Care Choice: Durable Medical Equipment, Home Health Living arrangements for the past 2 months: Apartment                           HH Arranged: RN, PT, OT (Active [facility] HHRN/PT/OT) HH Agency: [facility] Date [facility] Agency Contacted: 12/14/23 Time HH Agency Contacted: [phone] Representative spoke with at [facility] Agency: [facility]   Social Drivers of Health (SDOH) Interventions SDOH Screenings   Food Insecurity: No Food Insecurity (12/13/2023)  Housing: Low Risk  (12/13/2023)  Transportation Needs: No Transportation Needs (12/13/2023)  Recent Concern: Transportation Needs - Unmet Transportation Needs (11/30/2023)  Utilities: Not At Risk (12/13/2023)  Financial Resource Strain: Low Risk  (09/23/2020)  Social Connections: Socially Isolated (12/13/2023)  Stress: No Stress Concern Present (09/23/2020)  Tobacco Use: Medium Risk (12/12/2023)    Readmission Risk Interventions    12/03/2023    6:04 PM 11/27/2023    8:36 AM 05/06/2023    3:25 PM  Readmission Risk Prevention Plan  Transportation Screening Complete Complete Complete  Medication Review Oceanographer) Complete Complete Complete  PCP or Specialist appointment within 3-5 days of discharge Complete  Complete  HRI or Home Care Consult Complete Complete Complete  SW Recovery Care/Counseling Consult Complete Complete Complete  Palliative Care Screening Not  Applicable Not Applicable Not Applicable  Skilled Nursing Facility Not Applicable Not Applicable Not Applicable

## 2023-12-15 NOTE — Progress Notes (Addendum)
 PROGRESS NOTE    Mario [name]  FMW:[id number] DOB: [birth year] DOA: 12/12/2023 PCP: [name]   Brief Narrative: 73 year old with past medical history significant for end-stage COPD with multiple recent hospitalization, chronic hypoxic respiratory failure on 8 L of oxygen , metastatic [place] cancer status post SBRT, malignant pleural effusion, left lower lobe radiation with fibrosis, diabetes type 2, peripheral neuropathy, class II obesity, OSA noncompliant with CPAP, hypertension, history of PE was on Eliquis  until stopped due to ongoing hemoptysis, , ankylosing spondylitis, GERD, BPH, anxiety/depression/bipolar disorder with prior history of suicide attempt and also has history of malingering behavior.   Patient presents complaining of shortness of breath, continued to have cough.  Patient was placed IV Solu-Medrol .  Chest x-ray no need show bilateral left mid and lower lung consolidation and right lower lobe consolidation which is not significantly changed.  Assessment & Plan:   Principal Problem:   COPD exacerbation (HCC) Active Problems:   DM2 (diabetes mellitus, type 2) (HCC)   Chronic pain   BPH (benign prostatic hyperplasia)   Mood disorder   1-End-stage COPD Acute on Chronic hypoxic respiratory failure on 8 L of oxygen  - Chest x-ray no acute, show bilateral left mid and lower lung consolidation and right lower lobe consolidation. - He has had 2 recent  CT angio; 10/02 and 10/10 both negative for PE. - Agree with palliative care consultation, for symptom management -Continue Brovana , start Pulmicort .  Scheduled DuoNebs, refused Incruse -COVID and respiratory panel negative -Increased oxygen  requirement today, increase to 10 HFL.  - Continue IV Solu-Medrol , IV antibiotic.  Unable to rule out pneumonia on chronic lung finding Down to 9 L high flow oxygen , continue nebulizers Would treat with IV antibiotics for 7 -10 days to cover for PNA, IV steroids for  days,  might need longer course of oral prednisone . Continue with brovana , pulmicort , Duoneb.   Metastatic lung adenocarcinoma - Patient will need to follow-up with oncology of choice - Palliative care consulted, home with palliative care follow up . Patient is not ready fro hospice.   Diabetes type 2 Steroid-induced hyperglycemia - Sliding scale insulin   Hypertension; Continue Cardizem  BPH; continue Flomax  GERD; continue PPI Mood disorder; continue trazodone , gabapentin , Cymbalta , BuSpar  Chronic pain syndrome/neuropathy Chronic constipation  Estimated body mass index is 36.34 kg/m as calculated from the following:   Height as of this encounter: 6' 2 (1.88 m).   Weight as of this encounter: 128.4 kg.   DVT prophylaxis: Lovenox  Code Status: DNR Family Communication: Care discussed with patient Disposition Plan:  Status is: Observation    SmartLinks   The patient remains OBS appropriate and will d/c before 2 midnights.    Consultants:  Palliative care  Procedures:  None Antimicrobials:    Subjective: Continues to have persistent cough, still having shortness of breath. He is planning to move to an assisted living Objective: Vitals:   12/15/23 0502 12/15/23 0845 12/15/23 0859 12/15/23 1254  BP: (!) 158/82  (!) 147/105 (!) 146/81  Pulse: 82   88  Resp:    19  Temp: 98.2 F (36.8 C)   (!) 97.5 F (36.4 C)  TempSrc: Oral   Oral  SpO2: 98% 95% 95% 98%  Weight:      Height:        Intake/Output Summary (Last 24 hours) at 12/15/2023 1323 Last data filed at 12/15/2023 1223 Gross per 24 hour  Intake 1299.35 ml  Output 2850 ml  Net -1550.65 ml   Filed Weights   12/12/23  1921  Weight: 128.4 kg    Examination:  General exam: No acute distress Respiratory system: Bilateral wheezing Cardiovascular system: S 1, S 2 RRR Gastrointestinal system: BS present, soft, nt Central nervous system: non focal.  Extremities: no edema    Data Reviewed: I have  personally reviewed following labs and imaging studies  CBC: Recent Labs  Lab 12/12/23 1944 12/13/23 0533  WBC 16.2* 11.7*  HGB 13.9 14.6  HCT 44.2 46.6  MCV 91.9 94.3  PLT 357 349   Basic Metabolic Panel: Recent Labs  Lab 12/12/23 1944 12/14/23 0912  NA 136 137  K 4.7 4.4  CL 99 99  CO2 25 28  GLUCOSE 276* 172*  BUN 18 17  CREATININE 0.89 0.99  CALCIUM  8.7* 9.2   GFR: Estimated Creatinine Clearance: 96.1 mL/min (by C-G formula based on SCr of 0.99 mg/dL). Liver Function Tests: No results for input(s): AST, ALT, ALKPHOS, BILITOT, PROT, ALBUMIN in the last 168 hours.  No results for input(s): LIPASE, AMYLASE in the last 168 hours. No results for input(s): AMMONIA in the last 168 hours. Coagulation Profile: No results for input(s): INR, PROTIME in the last 168 hours. Cardiac Enzymes: No results for input(s): CKTOTAL, CKMB, CKMBINDEX, TROPONINI in the last 168 hours. BNP (last 3 results) Recent Labs    11/30/23 0300 12/07/23 0828 12/12/23 1944  PROBNP 127.0 52.1 <50.0   HbA1C: No results for input(s): HGBA1C in the last 72 hours. CBG: Recent Labs  Lab 12/14/23 1132 12/14/23 1633 12/14/23 1954 12/15/23 0737 12/15/23 1134  GLUCAP 175* 204* 226* 198* 127*   Lipid Profile: No results for input(s): CHOL, HDL, LDLCALC, TRIG, CHOLHDL, LDLDIRECT in the last 72 hours. Thyroid  Function Tests: No results for input(s): TSH, T4TOTAL, FREET4, T3FREE, THYROIDAB in the last 72 hours. Anemia Panel: No results for input(s): VITAMINB12, FOLATE, FERRITIN, TIBC, IRON, RETICCTPCT in the last 72 hours. Sepsis Labs: Recent Labs  Lab 12/13/23 0533  PROCALCITON <0.10    Recent Results (from the past 240 hours)  Culture, blood (Routine x 2)     Status: None   Collection Time: 12/08/23  3:05 AM   Specimen: BLOOD  Result Value Ref Range Status   Specimen Description   Final    BLOOD RIGHT  ANTECUBITAL Performed at [facility], [address], [place], [place] [address]    Special Requests   Final    Blood Culture adequate volume BOTTLES DRAWN AEROBIC AND ANAEROBIC Performed at [facility], [address], [place], [place] [address]    Culture   Final    NO GROWTH 5 DAYS Performed at [facility], [address] [place], [place] [address]    Report Status 12/13/2023 FINAL  Final  Resp panel by RT-PCR (RSV, Flu A&B, Covid) Anterior Nasal Swab     Status: None   Collection Time: 12/08/23  3:14 AM   Specimen: Anterior Nasal Swab  Result Value Ref Range Status   SARS Coronavirus 2 by RT PCR NEGATIVE NEGATIVE Final    Comment: (NOTE) SARS-CoV-2 target nucleic acids are NOT DETECTED.  The SARS-CoV-2 RNA is generally detectable in upper respiratory specimens during the acute phase of infection. The lowest concentration of SARS-CoV-2 viral copies this assay can detect is 138 copies/mL. A negative result does not preclude SARS-Cov-2 infection and should not be used as the sole basis for treatment or other patient management decisions. A negative result may occur with  improper specimen collection/handling, submission of specimen  other than nasopharyngeal swab, presence of viral mutation(s) within the areas targeted by this assay, and inadequate number of viral copies(<138 copies/mL). A negative result must be combined with clinical observations, patient history, and epidemiological information. The expected result is Negative.  Fact Sheet for Patients:  BloggerCourse.com  Fact Sheet for Healthcare Providers:  SeriousBroker.it  This test is no t yet approved or cleared by the [place]  FDA and  has been authorized for detection and/or diagnosis of SARS-CoV-2 by FDA under an Emergency Use Authorization (EUA). This EUA will remain  in effect (meaning this test can be used)  for the duration of the COVID-19 declaration under Section 564(b)(1) of the Act, 21 U.S.C.section 360bbb-3(b)(1), unless the authorization is terminated  or revoked sooner.       Influenza A by PCR NEGATIVE NEGATIVE Final   Influenza B by PCR NEGATIVE NEGATIVE Final    Comment: (NOTE) The Xpert Xpress SARS-CoV-2/FLU/RSV plus assay is intended as an aid in the diagnosis of influenza from Nasopharyngeal swab specimens and should not be used as a sole basis for treatment. Nasal washings and aspirates are unacceptable for Xpert Xpress SARS-CoV-2/FLU/RSV testing.  Fact Sheet for Patients: BloggerCourse.com  Fact Sheet for Healthcare Providers: SeriousBroker.it  This test is not yet approved or cleared by the [place]  FDA and has been authorized for detection and/or diagnosis of SARS-CoV-2 by FDA under an Emergency Use Authorization (EUA). This EUA will remain in effect (meaning this test can be used) for the duration of the COVID-19 declaration under Section 564(b)(1) of the Act, 21 U.S.C. section 360bbb-3(b)(1), unless the authorization is terminated or revoked.     Resp Syncytial Virus by PCR NEGATIVE NEGATIVE Final    Comment: (NOTE) Fact Sheet for Patients: BloggerCourse.com  Fact Sheet for Healthcare Providers: SeriousBroker.it  This test is not yet approved or cleared by the [place]  FDA and has been authorized for detection and/or diagnosis of SARS-CoV-2 by FDA under an Emergency Use Authorization (EUA). This EUA will remain in effect (meaning this test can be used) for the duration of the COVID-19 declaration under Section 564(b)(1) of the Act, 21 U.S.C. section 360bbb-3(b)(1), unless the authorization is terminated or revoked.  Performed at [facility], [address], [place], [place] [address]   Culture, blood (Routine x 2)     Status:  None   Collection Time: 12/08/23  8:11 PM   Specimen: BLOOD RIGHT HAND  Result Value Ref Range Status   Specimen Description   Final    BLOOD RIGHT HAND Performed at [facility], [address] [place], [place] [address]    Special Requests   Final    BOTTLES DRAWN AEROBIC AND ANAEROBIC Blood Culture adequate volume Performed at [facility], [address], [place], [place] [address]    Culture   Final    NO GROWTH 5 DAYS Performed at [facility], [address] [place], [place] [address]    Report Status 12/13/2023 FINAL  Final  Respiratory (~20 pathogens) panel by PCR     Status: None   Collection Time: 12/13/23  3:20 AM   Specimen: Nasopharyngeal Swab; Respiratory  Result Value Ref Range Status   Adenovirus NOT DETECTED NOT DETECTED Final   Coronavirus 229E NOT DETECTED NOT DETECTED Final    Comment: (NOTE) The Coronavirus on the Respiratory Panel, DOES NOT test for the novel  Coronavirus (2019 nCoV)    Coronavirus HKU1 NOT DETECTED NOT DETECTED Final  Coronavirus NL63 NOT DETECTED NOT DETECTED Final   Coronavirus OC43 NOT DETECTED NOT DETECTED Final   Metapneumovirus NOT DETECTED NOT DETECTED Final   Rhinovirus / Enterovirus NOT DETECTED NOT DETECTED Final   Influenza A NOT DETECTED NOT DETECTED Final   Influenza B NOT DETECTED NOT DETECTED Final   Parainfluenza Virus 1 NOT DETECTED NOT DETECTED Final   Parainfluenza Virus 2 NOT DETECTED NOT DETECTED Final   Parainfluenza Virus 3 NOT DETECTED NOT DETECTED Final   Parainfluenza Virus 4 NOT DETECTED NOT DETECTED Final   Respiratory Syncytial Virus NOT DETECTED NOT DETECTED Final   Bordetella pertussis NOT DETECTED NOT DETECTED Final   Bordetella Parapertussis NOT DETECTED NOT DETECTED Final   Chlamydophila pneumoniae NOT DETECTED NOT DETECTED Final   Mycoplasma pneumoniae NOT DETECTED NOT DETECTED Final    Comment: Performed at [facility], [address] [place], [place]  [address]  SARS Coronavirus 2 by RT PCR (hospital order, performed in [facility] hospital lab) *cepheid single result test* Anterior Nasal Swab     Status: None   Collection Time: 12/13/23  3:20 AM   Specimen: Anterior Nasal Swab  Result Value Ref Range Status   SARS Coronavirus 2 by RT PCR NEGATIVE NEGATIVE Final    Comment: (NOTE) SARS-CoV-2 target nucleic acids are NOT DETECTED.  The SARS-CoV-2 RNA is generally detectable in upper and lower respiratory specimens during the acute phase of infection. The lowest concentration of SARS-CoV-2 viral copies this assay can detect is 250 copies / mL. A negative result does not preclude SARS-CoV-2 infection and should not be used as the sole basis for treatment or other patient management decisions.  A negative result may occur with improper specimen collection / handling, submission of specimen other than nasopharyngeal swab, presence of viral mutation(s) within the areas targeted by this assay, and inadequate number of viral copies (<250 copies / mL). A negative result must be combined with clinical observations, patient history, and epidemiological information.  Fact Sheet for Patients:   RoadLapTop.co.za  Fact Sheet for Healthcare Providers: http://kim-miller.com/  This test is not yet approved or  cleared by the [place]  FDA and has been authorized for detection and/or diagnosis of SARS-CoV-2 by FDA under an Emergency Use Authorization (EUA).  This EUA will remain in effect (meaning this test can be used) for the duration of the COVID-19 declaration under Section 564(b)(1) of the Act, 21 U.S.C. section 360bbb-3(b)(1), unless the authorization is terminated or revoked sooner.  Performed at [facility], [address], [place], [place] [address]          Radiology Studies: No results found.       Scheduled Meds:  arformoterol   15 mcg Nebulization BID    aspirin  EC  81 mg Oral Daily   azithromycin   250 mg Oral Daily   budesonide  (PULMICORT ) nebulizer solution  0.25 mg Nebulization BID   buPROPion   300 mg Oral q morning   busPIRone   15 mg Oral Daily   cholecalciferol   1,000 Units Oral Daily   cycloSPORINE   1 drop Both Eyes Q12H   diltiazem   120 mg Oral Daily   docusate sodium   100 mg Oral QHS   docusate sodium   200 mg Oral Daily   DULoxetine   60 mg Oral BID   enoxaparin  (LOVENOX ) injection  40 mg Subcutaneous Q24H   ferrous sulfate   325 mg Oral Q breakfast   fluticasone   2 spray Each Nare Daily   gabapentin   100  mg Oral TID   guaiFENesin   1,200 mg Oral BID   insulin  aspart  0-5 Units Subcutaneous QHS   insulin  aspart  0-9 Units Subcutaneous TID WC   ipratropium-albuterol   3 mL Nebulization Q6H   melatonin  6 mg Oral QHS   methylPREDNISolone  (SOLU-MEDROL ) injection  40 mg Intravenous Q12H   Oxcarbazepine   900 mg Oral BID   pantoprazole   40 mg Oral Daily   polyethylene glycol  17 g Oral Daily   tamsulosin   0.8 mg Oral Daily   traZODone   75 mg Oral QHS   cyanocobalamin  100 mcg Oral Daily   Continuous Infusions:  ceFEPime  (MAXIPIME ) IV 2 g (12/15/23 1151)     LOS: 1 day    Time spent: 35 minutes    Judithe Keetch A Daeshaun Specht, MD [facility]   If 7PM-7AM, please contact night-coverage [email or web address]  12/15/2023, 1:23 PM

## 2023-12-15 NOTE — TOC Progression Note (Signed)
 Transition of Care [facility]) - Progression Note    Patient Details  Name: Mario [name] MRN: [id number] Date of Birth: 1950-11-25  Transition [facility]) CM/SW Contact  Raschelle Wisenbaker, Nathanel, RN Phone Number: 12/15/2023, 12:24 PM  Clinical Narrative: On [facility] select specialty rep Kristeen will screen.        Barriers to Discharge: Continued Medical Work up               Expected Discharge Plan and Services   Discharge Planning Services: CM Consult Post Acute Care Choice: Durable Medical Equipment, Home Health Living arrangements for the past 2 months: Apartment                           HH Arranged: RN, PT, OT ([facility] HHRN/PT/OT) HH Agency: [facility] Date [facility] Agency Contacted: 12/14/23 Time HH Agency Contacted: 1108 Representative spoke with at [facility] Agency: [place]   Social Drivers of Health (SDOH) Interventions SDOH Screenings   Food Insecurity: No Food Insecurity (12/13/2023)  Housing: Low Risk  (12/13/2023)  Transportation Needs: No Transportation Needs (12/13/2023)  Recent Concern: Transportation Needs - Unmet Transportation Needs (11/30/2023)  Utilities: Not At Risk (12/13/2023)  Financial Resource Strain: Low Risk  (09/23/2020)  Social Connections: Socially Isolated (12/13/2023)  Stress: No Stress Concern Present (09/23/2020)  Tobacco Use: Medium Risk (12/12/2023)    Readmission Risk Interventions    12/03/2023    6:04 PM 11/27/2023    8:36 AM 05/06/2023    3:25 PM  Readmission Risk Prevention Plan  Transportation Screening Complete Complete Complete  Medication Review Oceanographer) Complete Complete Complete  PCP or Specialist appointment within 3-5 days of discharge Complete  Complete  HRI or Home Care Consult Complete Complete Complete  SW Recovery Care/Counseling Consult Complete Complete Complete  Palliative Care Screening Not Applicable Not Applicable Not Applicable  Skilled Nursing Facility Not Applicable Not Applicable  Not Applicable

## 2023-12-15 NOTE — TOC Transition Note (Signed)
 Transition of Care [facility]) - Discharge Note   Patient Details  Name: DAIMION ADAMCIK MRN: [id number] Date of Birth: 1950-12-01  Transition of Care [facility]) CM/SW Contact:  Bascom Service, RN Phone Number: 12/15/2023, 3:38 PM   Clinical Narrative: Patient accepted to [facility] specialty-LT[facility] rep Ciera received auth-Nsg to manage Acute to acute transfer via carelink. No further CM needs.      Final next level of care: Long Term Acute Care (LTAC) Barriers to Discharge: No Barriers Identified   Patient Goals and CMS Choice Patient states their goals for this hospitalization and ongoing recovery are:: Home CMS Medicare.gov Compare Post Acute Care list provided to:: Patient Choice offered to / list presented to : Patient [place] ownership interest in [facility].provided to:: Patient    Discharge Placement                       Discharge Plan and Services Additional resources added to the After Visit Summary for     Discharge Planning Services: CM Consult Post Acute Care Choice: Durable Medical Equipment, Home Health                    HH Arranged: RN, PT, OT ([facility] HHRN/PT/OT) HH Agency: [facility] Date [facility] Agency Contacted: 12/14/23 Time HH Agency Contacted: [phone] Representative spoke with at [facility] Agency: [facility]  Social Drivers of Health (SDOH) Interventions SDOH Screenings   Food Insecurity: No Food Insecurity (12/13/2023)  Housing: Low Risk  (12/13/2023)  Transportation Needs: No Transportation Needs (12/13/2023)  Recent Concern: Transportation Needs - Unmet Transportation Needs (11/30/2023)  Utilities: Not At Risk (12/13/2023)  Financial Resource Strain: Low Risk  (09/23/2020)  Social Connections: Socially Isolated (12/13/2023)  Stress: No Stress Concern Present (09/23/2020)  Tobacco Use: Medium Risk (12/12/2023)     Readmission Risk Interventions    12/03/2023    6:04 PM 11/27/2023    8:36 AM 05/06/2023    3:25 PM  Readmission  Risk Prevention Plan  Transportation Screening Complete Complete Complete  Medication Review Oceanographer) Complete Complete Complete  PCP or Specialist appointment within 3-5 days of discharge Complete  Complete  HRI or Home Care Consult Complete Complete Complete  SW Recovery Care/Counseling Consult Complete Complete Complete  Palliative Care Screening Not Applicable Not Applicable Not Applicable  Skilled Nursing Facility Not Applicable Not Applicable Not Applicable

## 2023-12-15 NOTE — Discharge Summary (Signed)
 Physician Discharge Summary   Patient: Mario [name] MRN: [id number] DOB: 02-16-1951  Admit date:     12/12/2023  Discharge date: 12/15/23  Discharge Physician: Owen DELENA Lore   PCP: [name]   Recommendations at discharge:    Transfer to LTAC for management of acute on chronic Hypoxic resp failure. PNA.  Consider oxycodone  2.5 mg PRN 6 hours for dyspnea.  Needs IV solumedrol and IV antibiotics.   Discharge Diagnoses: Principal Problem:   COPD exacerbation (HCC) Active Problems:   DM2 (diabetes mellitus, type 2) (HCC)   Chronic pain   BPH (benign prostatic hyperplasia)   Mood disorder  Resolved Problems:   * No resolved hospital problems. *  Hospital Course: 73 year old with past medical history significant for end-stage COPD with multiple recent hospitalization, chronic hypoxic respiratory failure on 8 L of oxygen , metastatic [place] cancer status post SBRT, malignant pleural effusion, left lower lobe radiation with fibrosis, diabetes type 2, peripheral neuropathy, class II obesity, OSA noncompliant with CPAP, hypertension, history of PE was on Eliquis  until stopped due to ongoing hemoptysis, , ankylosing spondylitis, GERD, BPH, anxiety/depression/bipolar disorder with prior history of suicide attempt and also has history of malingering behavior.    Patient presents complaining of shortness of breath, continued to have cough.  Patient was placed IV Solu-Medrol .  Chest x-ray no need show bilateral left mid and lower lung consolidation and right lower lobe consolidation which is not significantly changed.  Assessment and Plan: 1-End-stage COPD Acute on Chronic hypoxic respiratory failure on 8 L of oxygen  - Chest x-ray no acute, show bilateral left mid and lower lung consolidation and right lower lobe consolidation. - He has had 2 recent  CT angio; 10/02 and 10/10 both negative for PE. - Agree with palliative care consultation, for symptom management -Continue  Brovana , start Pulmicort .  Scheduled DuoNebs, refused Incruse -COVID and respiratory panel negative -Increased oxygen  requirement today, increase to 10 HFL.  - Continue IV Solu-Medrol , IV antibiotic.  Unable to rule out pneumonia on chronic lung finding Down to 9 L high flow oxygen , continue nebulizers Would treat with IV antibiotics for 7 -10 days to cover for PNA, IV steroids for  days, might need longer course of oral prednisone . Continue with brovana , pulmicort , Duoneb.   transfer to LTAC to try to maximized Tx, and see if oxygen  requirement improved with IV steroids and IV antibiotics.   Metastatic lung adenocarcinoma - Patient will need to follow-up with oncology of choice - Palliative care consulted, home with palliative care follow up . Patient is not ready fro hospice.    Diabetes type 2 Steroid-induced hyperglycemia - Sliding scale insulin    Hypertension; Continue Cardizem  BPH; continue Flomax  GERD; continue PPI Mood disorder; continue trazodone , gabapentin , Cymbalta , BuSpar  Chronic pain syndrome/neuropathy Chronic constipation   Estimated body mass index is 36.34 kg/m as calculated from the following:   Height as of this encounter: 6' 2 (1.88 m).   Weight as of this encounter: 128.4 kg.            Consultants: None Procedures performed: none Disposition: LTAC Diet recommendation:  Carb modified diet DISCHARGE MEDICATION: Allergies as of 12/15/2023       Reactions   Beet [beta Vulgaris] Nausea And Vomiting   Demerol [meperidine] Nausea And Vomiting, Other (See Comments)   Made the patient violently sick   Nsaids Other (See Comments)   Contraindication due to CKD    Zocor [simvastatin] Nausea And Vomiting, Other (See Comments)   Made patient  feel very jittery, also   Liver Nausea And Vomiting, Other (See Comments)   ANY Liver!!        Medication List     STOP taking these medications    BLACK CURRANT SEED OIL PO   cyclobenzaprine  10 MG  tablet Commonly known as: FLEXERIL    GARLIC OIL PO   Glucerna Liqd   KRILL OIL PO   lidocaine  5 % ointment Commonly known as: XYLOCAINE    MAG-OXIDE PO   magnesium  citrate Soln   predniSONE  5 MG tablet Commonly known as: DELTASONE    Turmeric Curcumin Caps       TAKE these medications    acetaminophen  500 MG tablet Commonly known as: TYLENOL  Take 1,000 mg by mouth every 6 (six) hours as needed for mild pain (pain score 1-3), moderate pain (pain score 4-6) or headache.   albuterol  (2.5 MG/3ML) 0.083% nebulizer solution Commonly known as: PROVENTIL  Take 2.5 mg by nebulization every 6 (six) hours as needed for wheezing or shortness of breath.   albuterol  108 (90 Base) MCG/ACT inhaler Commonly known as: VENTOLIN  HFA Inhale 2 puffs into the lungs every 4 (four) hours as needed for wheezing or shortness of breath.   aspirin  EC 81 MG tablet Take 81 mg by mouth daily. Swallow whole.   azithromycin  250 MG tablet Commonly known as: ZITHROMAX  Take 1 tablet (250 mg total) by mouth daily for 3 days.   bisacodyl  5 MG EC tablet Commonly known as: DULCOLAX Take 2 tablets (10 mg total) by mouth daily as needed for moderate constipation. What changed: when to take this   buPROPion  300 MG 24 hr tablet Commonly known as: WELLBUTRIN  XL Take 300 mg by mouth every morning.   busPIRone  15 MG tablet Commonly known as: BUSPAR  Take 15 mg by mouth in the morning.   CALCIUM  500 PO Take 500 mg by mouth in the morning.   Carboxymethylcellulose Sod PF 0.5 % Soln Place 1 drop into both eyes 4 (four) times daily as needed (dry eyes).   cholecalciferol  25 MCG (1000 UNIT) tablet Commonly known as: VITAMIN D3 Take 1,000 Units by mouth daily.   clobetasol  ointment 0.05 % Commonly known as: TEMOVATE  Apply 1 Application topically 2 (two) times daily. For ankles and feet   cycloSPORINE  0.05 % ophthalmic emulsion Commonly known as: RESTASIS  Place 1 drop into both eyes every 12 (twelve)  hours.   diltiazem  120 MG 24 hr capsule Commonly known as: CARDIZEM  CD Take 1 capsule (120 mg total) by mouth daily.   docusate sodium  100 MG capsule Commonly known as: COLACE Take 100-200 mg by mouth 2 (two) times daily. Take two in am. Take one in pm.   DULoxetine  60 MG capsule Commonly known as: CYMBALTA  Take 60 mg by mouth 2 (two) times daily.   ferrous sulfate  325 (65 FE) MG tablet Take 325 mg by mouth daily with breakfast.   fluticasone  50 MCG/ACT nasal spray Commonly known as: FLONASE  Place 2 sprays into both nostrils in the morning.   gabapentin  100 MG capsule Commonly known as: NEURONTIN  Take 100 mg by mouth 3 (three) times daily.   guaiFENesin  600 MG 12 hr tablet Commonly known as: MUCINEX  Take 1 tablet (600 mg total) by mouth 2 (two) times daily as needed for cough or to loosen phlegm.   HYDROcodone -acetaminophen  10-325 MG tablet Commonly known as: NORCO Take 1 tablet by mouth every 6 (six) hours as needed for moderate pain (pain score 4-6).   ipratropium-albuterol  0.5-2.5 (3) MG/3ML Soln  Commonly known as: DUONEB Take 3 mLs by nebulization every 6 (six) hours.   lactulose  10 GM/15ML solution Commonly known as: CHRONULAC  Take 20 g by mouth 2 (two) times daily as needed for mild constipation.   Melatonin 3 MG Caps Take 6 mg by mouth at bedtime.   multivitamin tablet Take 1 tablet by mouth daily with breakfast.   omeprazole  20 MG capsule Commonly known as: PRILOSEC Take 1 capsule (20 mg total) by mouth daily.   Oxcarbazepine  300 MG tablet Commonly known as: TRILEPTAL  Take 900 mg by mouth 2 (two) times daily.   OXYGEN  Inhale 6 L/min into the lungs continuous.   polyethylene glycol powder 17 GM/SCOOP powder Commonly known as: GLYCOLAX /MIRALAX  Take 17 g by mouth in the morning.   senna 8.6 MG Tabs tablet Commonly known as: SENOKOT Take 2 tablets (17.2 mg total) by mouth 2 (two) times daily. What changed:  how much to take when to take this    tamsulosin  0.4 MG Caps capsule Commonly known as: FLOMAX  Take 0.8 mg by mouth in the morning.   Tiotropium Bromide -Olodaterol 2.5-2.5 MCG/ACT Aers Inhale 2 puffs into the lungs every morning.   traZODone  50 MG tablet Commonly known as: DESYREL  Take 75 mg by mouth at bedtime.   VITAMIN B12 PO Take 1 tablet by mouth in the morning.        Discharge Exam: Filed Weights   12/12/23 1921  Weight: 128.4 kg   General; NAD  Condition at discharge: fair  The results of significant diagnostics from this hospitalization (including imaging, microbiology, ancillary and laboratory) are listed below for reference.   Imaging Studies: DG Chest 2 View Result Date: 12/12/2023 EXAM: 2 VIEW(S) XRAY OF THE CHEST 12/12/2023 08:15:00 PM COMPARISON: Chest x-ray 11/30/2023 and chest CT 12/08/2023. CLINICAL HISTORY: Shortness of breath. Per chart - Patient presents from home due to shortness of breath which began around 1100 today and has progressively gotten worse since. When fire arrived, they placed him on 8 L and SPO2 was 86%. EMS placed him on a non re breather followed by a duo ; neb. HX: Stage 4 Lung cancer. FINDINGS: LUNGS AND PLEURA: Bilateral left mid and lower lung consolidation and right lower lung consolidation have not significantly changed. No pulmonary edema. No significant pleural effusion or pneumothorax. HEART AND MEDIASTINUM: The heart is mildly enlarged, unchanged. BONES AND SOFT TISSUES: No acute osseous abnormality. IMPRESSION: 1. Bilateral left mid and lower lung consolidation and right lower lung consolidation, not significantly changed. 2. Mild cardiomegaly, unchanged. Electronically signed by: Greig Pique MD 12/12/2023 08:23 PM EDT RP Workstation: [id number]   CT Angio Chest PE W and/or Wo Contrast Result Date: 12/08/2023 EXAM: CTA of the Chest with contrast for PE 12/08/2023 05:00:00 AM TECHNIQUE: CTA of the chest was performed without and with the administration of 100 mL of  iohexol  (OMNIPAQUE ) 350 MG/ML injection. Multiplanar reformatted images are provided for review. MIP images are provided for review. Automated exposure control, iterative reconstruction, and/or weight based adjustment of the mA/kV was utilized to reduce the radiation dose to as low as reasonably achievable. COMPARISON: 11/30/2023 CLINICAL HISTORY: Syncope/presyncope, cerebrovascular cause suspected. SHOB from home, d/c'd 10/9 for same complaint. 85% on 9L at home, came up to 94% on non-rebreather hx of COPD and lung cancer. Notes from chart; wbc's 17.8, GFR>60. FINDINGS: PULMONARY ARTERIES: Pulmonary arteries are adequately opacified for evaluation. No pulmonary embolism. Main pulmonary artery is normal in caliber. MEDIASTINUM: The heart and pericardium demonstrate no acute abnormality. Aortic  atherosclerotic calcification. Coronary artery calcifications. LYMPH NODES: Prominent mediastinal and hilar lymph nodes likely reactive in the setting of multifocal pneumonia. No axillary lymphadenopathy. LUNGS AND PLEURA: Emphysema and diffuse bronchial wall thickening compatible with COPD. Central airways are patent. Extensive airspace disease noted within both lungs which is most severe in the lower lobes. This is not significantly changed compared with the previous exam. Small right pleural effusion is unchanged. No pneumothorax. UPPER ABDOMEN: Unchanged complicated cyst arising from the lateral cortex of the right kidney measuring 2.2 cm and 41 hounsfield units. SOFT TISSUES AND BONES: Multiple remote healed left lateral rib fracture deformities. Partial ankylosis of the thoracic spine. IMPRESSION: 1. No evidence of pulmonary embolism. 2. Extensive airspace disease within both lungs, most severe in the lower lobes, not significantly changed compared with the previous exam. 3. Emphysema and diffuse bronchial wall thickening compatible with COPD. 4. Small right pleural effusion, unchanged. 5. Aortic atherosclerosis and  coronary artery calcifications. Electronically signed by: Waddell Calk MD 12/08/2023 05:54 AM EDT RP Workstation: [id number]   CT Angio Chest PE W/Cm &/Or Wo Cm Result Date: 11/30/2023 EXAM: CTA CHEST 11/30/2023 05:22:30 AM TECHNIQUE: CTA of the chest was performed without and with the administration of 100 mL iohexol  (OMNIPAQUE ) 350 MG/ML injection. Multiplanar reformatted images are provided for review. MIP images are provided for review. Automated exposure control, iterative reconstruction, and/or weight based adjustment of the mA/kV was utilized to reduce the radiation dose to as low as reasonably achievable. COMPARISON: CT angiogram of the chest dated 07/20/2023. CLINICAL HISTORY: Pulmonary embolism (PE) suspected, high prob. Increased SOB, recent admission for COPD exacerbation, wbc's 16.5, GFR>60, hx of lung ca 09/2020, radiation to left lung; Port cxr prior to ct; 100 omni 350. FINDINGS: PULMONARY ARTERIES: Pulmonary arteries are adequately opacified for evaluation. No acute pulmonary embolus. Main pulmonary artery is normal in caliber. MEDIASTINUM: There is moderate calcific coronary artery disease. The pericardium demonstrates no acute abnormality. The thoracic aorta also demonstrates moderate calcific atheromatous disease. LYMPH NODES: There are numerous shotty mediastinal nodes, as before. No hilar or axillary lymphadenopathy. LUNGS AND PLEURA: There is moderate-to-severe central lobular emphysema present. There is extensive patchy opacification/consolidation of the lower lobes bilaterally and of the lingula. There are also patchy neuro bronchovascular nodular opacities within the right middle lobe. No evidence of pleural effusion or pneumothorax. UPPER ABDOMEN: Simple-appearing exophytic cysts arising from the kidneys bilaterally. The abdominal aorta demonstrates moderate calcific atheromatous disease. SOFT TISSUES AND BONES: No acute bone or soft tissue abnormality. IMPRESSION: 1. No evidence of  pulmonary embolism. 2. Extensive multifocal pneumonia involving the lower lobes bilaterally, lingula, and right middle lobe. 3. Moderate-to-severe centrilobular emphysema. Electronically signed by: Evalene Coho MD 11/30/2023 05:50 AM EDT RP Workstation: [id number]   DG Chest Port 1 View Result Date: 11/30/2023 EXAM: 1 VIEW(S) XRAY OF THE CHEST 11/30/2023 03:07:00 AM COMPARISON: 11/23/2023 CLINICAL HISTORY: sob. Shortness of breath FINDINGS: LUNGS AND PLEURA: Patchy airspace opacities throughout left lung. Right lower lung zone airspace opacity. Small bilateral pleural effusions. No pulmonary edema. No pneumothorax. HEART AND MEDIASTINUM: No acute abnormality of the cardiac and mediastinal silhouettes. BONES AND SOFT TISSUES: No acute osseous abnormality. IMPRESSION: 1. Patchy airspace opacities throughout the left lung and in the right lower lung similar to 11/23/2023. 2. Small bilateral pleural effusions. Electronically signed by: Norman Gatlin MD 11/30/2023 03:19 AM EDT RP Workstation: [id number]   DG Chest Port 1 View Result Date: 11/23/2023 EXAM: 1 VIEW(S) XRAY OF THE CHEST 11/23/2023 11:24:06 PM COMPARISON: CTA chest  dated 07/20/2023. CLINICAL HISTORY: shortness of breath. Per chart - c/o SOB for the past 3-4 days. Per EMS, pts initial oxygen  saturation was 88% on his baseline 8L [place]. Pt was given one Duoneb treatment with some relief. On arrival, labored breathing noted with retractions and pt is unable to speak in full ; sentences with out taking a breath. Pt denies any recent sick contacts, chest pain, fevers, chills, n/v/d. Dr. Cottie at bedside and respiratory notified FINDINGS: LUNGS AND PLEURA: Stable multifocal opacities are present in the left mid/lower lung and right lower lung. While this has previously been considered suspicious for pneumonia, given the chronic appearance, multifocal semi-invasive peripheral adenocarcinoma (formally, bronchoalveolar cell carcinoma) should be considered.  Associated small bilateral pleural effusions, chronic. No pulmonary edema. No pneumothorax. HEART AND MEDIASTINUM: No acute abnormality of the cardiac and mediastinal silhouettes. BONES AND SOFT TISSUES: No acute osseous abnormality. IMPRESSION: 1. Stable multifocal opacities in the left mid/lower lung and right lower lung. Given chronic appearance, multifocal semi-invasive peripheral adenocarcinoma should be considered. 2. Small bilateral pleural effusions, chronic. Electronically signed by: Pinkie Pebbles MD 11/23/2023 11:29 PM EDT RP Workstation: [id number]    Microbiology: Results for orders placed or performed during the hospital encounter of 12/12/23  Respiratory (~20 pathogens) panel by PCR     Status: None   Collection Time: 12/13/23  3:20 AM   Specimen: Nasopharyngeal Swab; Respiratory  Result Value Ref Range Status   Adenovirus NOT DETECTED NOT DETECTED Final   Coronavirus 229E NOT DETECTED NOT DETECTED Final    Comment: (NOTE) The Coronavirus on the Respiratory Panel, DOES NOT test for the novel  Coronavirus (2019 nCoV)    Coronavirus HKU1 NOT DETECTED NOT DETECTED Final   Coronavirus NL63 NOT DETECTED NOT DETECTED Final   Coronavirus OC43 NOT DETECTED NOT DETECTED Final   Metapneumovirus NOT DETECTED NOT DETECTED Final   Rhinovirus / Enterovirus NOT DETECTED NOT DETECTED Final   Influenza A NOT DETECTED NOT DETECTED Final   Influenza B NOT DETECTED NOT DETECTED Final   Parainfluenza Virus 1 NOT DETECTED NOT DETECTED Final   Parainfluenza Virus 2 NOT DETECTED NOT DETECTED Final   Parainfluenza Virus 3 NOT DETECTED NOT DETECTED Final   Parainfluenza Virus 4 NOT DETECTED NOT DETECTED Final   Respiratory Syncytial Virus NOT DETECTED NOT DETECTED Final   Bordetella pertussis NOT DETECTED NOT DETECTED Final   Bordetella Parapertussis NOT DETECTED NOT DETECTED Final   Chlamydophila pneumoniae NOT DETECTED NOT DETECTED Final   Mycoplasma pneumoniae NOT DETECTED NOT DETECTED Final     Comment: Performed at [facility], [address] [place], [place] [address]  SARS Coronavirus 2 by RT PCR (hospital order, performed in [facility] hospital lab) *cepheid single result test* Anterior Nasal Swab     Status: None   Collection Time: 12/13/23  3:20 AM   Specimen: Anterior Nasal Swab  Result Value Ref Range Status   SARS Coronavirus 2 by RT PCR NEGATIVE NEGATIVE Final    Comment: (NOTE) SARS-CoV-2 target nucleic acids are NOT DETECTED.  The SARS-CoV-2 RNA is generally detectable in upper and lower respiratory specimens during the acute phase of infection. The lowest concentration of SARS-CoV-2 viral copies this assay can detect is 250 copies / mL. A negative result does not preclude SARS-CoV-2 infection and should not be used as the sole basis for treatment or other patient management decisions.  A negative result may occur with improper specimen collection / handling, submission of specimen other than nasopharyngeal swab,  presence of viral mutation(s) within the areas targeted by this assay, and inadequate number of viral copies (<250 copies / mL). A negative result must be combined with clinical observations, patient history, and epidemiological information.  Fact Sheet for Patients:   RoadLapTop.co.za  Fact Sheet for Healthcare Providers: http://kim-miller.com/  This test is not yet approved or  cleared by the [place]  FDA and has been authorized for detection and/or diagnosis of SARS-CoV-2 by FDA under an Emergency Use Authorization (EUA).  This EUA will remain in effect (meaning this test can be used) for the duration of the COVID-19 declaration under Section 564(b)(1) of the Act, 21 U.S.C. section 360bbb-3(b)(1), unless the authorization is terminated or revoked sooner.  Performed at [facility], [address] [name], [place] [address]     Labs: CBC: Recent Labs  Lab  12/12/23 1944 12/13/23 0533  WBC 16.2* 11.7*  HGB 13.9 14.6  HCT 44.2 46.6  MCV 91.9 94.3  PLT 357 349   Basic Metabolic Panel: Recent Labs  Lab 12/12/23 1944 12/14/23 0912  NA 136 137  K 4.7 4.4  CL 99 99  CO2 25 28  GLUCOSE 276* 172*  BUN 18 17  CREATININE 0.89 0.99  CALCIUM  8.7* 9.2   Liver Function Tests: No results for input(s): AST, ALT, ALKPHOS, BILITOT, PROT, ALBUMIN in the last 168 hours. CBG: Recent Labs  Lab 12/14/23 1132 12/14/23 1633 12/14/23 1954 12/15/23 0737 12/15/23 1134  GLUCAP 175* 204* 226* 198* 127*    Discharge time spent: greater than 30 minutes.  Signed: Owen DELENA Lore, MD [facility] 12/15/2023

## 2023-12-15 NOTE — Progress Notes (Signed)
 [facility] here at bedside to transport the patient

## 2023-12-15 NOTE — Progress Notes (Signed)
 Called report to charge nurse in [facility]. Informed pt will be going to 5 east room 25.

## 2023-12-16 ENCOUNTER — Institutional Professional Consult (permissible substitution) (HOSPITAL_COMMUNITY)

## 2023-12-16 DIAGNOSIS — J90 Pleural effusion, not elsewhere classified: Secondary | ICD-10-CM | POA: Diagnosis not present

## 2023-12-16 DIAGNOSIS — J449 Chronic obstructive pulmonary disease, unspecified: Secondary | ICD-10-CM | POA: Diagnosis not present

## 2023-12-16 DIAGNOSIS — C7800 Secondary malignant neoplasm of unspecified lung: Secondary | ICD-10-CM | POA: Diagnosis not present

## 2023-12-16 DIAGNOSIS — J9621 Acute and chronic respiratory failure with hypoxia: Secondary | ICD-10-CM | POA: Diagnosis not present

## 2023-12-16 LAB — COMPREHENSIVE METABOLIC PANEL WITH GFR
ALT: 14 U/L (ref 0–44)
AST: 13 U/L — ABNORMAL LOW (ref 15–41)
Albumin: 2.8 g/dL — ABNORMAL LOW (ref 3.5–5.0)
Alkaline Phosphatase: 63 U/L (ref 38–126)
Anion gap: 9 (ref 5–15)
BUN: 19 mg/dL (ref 8–23)
CO2: 29 mmol/L (ref 22–32)
Calcium: 8.5 mg/dL — ABNORMAL LOW (ref 8.9–10.3)
Chloride: 97 mmol/L — ABNORMAL LOW (ref 98–111)
Creatinine, Ser: 0.96 mg/dL (ref 0.61–1.24)
GFR, Estimated: >60 mL/min (ref >60)
Glucose, Bld: 152 mg/dL — ABNORMAL HIGH (ref 70–99)
Potassium: 3.8 mmol/L (ref 3.5–5.1)
Sodium: 135 mmol/L (ref 135–145)
Total Bilirubin: 0.4 mg/dL (ref 0.0–1.2)
Total Protein: 5.8 g/dL — ABNORMAL LOW (ref 6.5–8.1)

## 2023-12-16 LAB — CBC WITH DIFFERENTIAL/PLATELET
Abs Immature Granulocytes: 0.1 K/uL — ABNORMAL HIGH (ref 0.00–0.07)
Basophils Absolute: 0.1 K/uL (ref 0.0–0.1)
Basophils Relative: 0 %
Eosinophils Absolute: 1.1 K/uL — ABNORMAL HIGH (ref 0.0–0.5)
Eosinophils Relative: 7 %
HCT: 39.9 % (ref 39.0–52.0)
Hemoglobin: 13 g/dL (ref 13.0–17.0)
Immature Granulocytes: 1 %
Lymphocytes Relative: 17 %
Lymphs Abs: 2.9 K/uL (ref 0.7–4.0)
MCH: 29.7 pg (ref 26.0–34.0)
MCHC: 32.6 g/dL (ref 30.0–36.0)
MCV: 91.1 fL (ref 80.0–100.0)
Monocytes Absolute: 1.5 K/uL — ABNORMAL HIGH (ref 0.1–1.0)
Monocytes Relative: 9 %
Neutro Abs: 11.2 K/uL — ABNORMAL HIGH (ref 1.7–7.7)
Neutrophils Relative %: 66 %
Platelets: 319 K/uL (ref 150–400)
RBC: 4.38 MIL/uL (ref 4.22–5.81)
RDW: 14.5 % (ref 11.5–15.5)
WBC: 16.9 K/uL — ABNORMAL HIGH (ref 4.0–10.5)
nRBC: 0 % (ref 0.0–0.2)

## 2023-12-18 DIAGNOSIS — J9621 Acute and chronic respiratory failure with hypoxia: Secondary | ICD-10-CM | POA: Diagnosis not present

## 2023-12-18 DIAGNOSIS — C7800 Secondary malignant neoplasm of unspecified lung: Secondary | ICD-10-CM | POA: Diagnosis not present

## 2023-12-18 DIAGNOSIS — J449 Chronic obstructive pulmonary disease, unspecified: Secondary | ICD-10-CM | POA: Diagnosis not present

## 2023-12-18 DIAGNOSIS — J90 Pleural effusion, not elsewhere classified: Secondary | ICD-10-CM | POA: Diagnosis not present

## 2023-12-18 LAB — CBC WITH DIFFERENTIAL/PLATELET
Abs Immature Granulocytes: 0.1 K/uL — ABNORMAL HIGH (ref 0.00–0.07)
Basophils Absolute: 0.1 K/uL (ref 0.0–0.1)
Basophils Relative: 1 %
Eosinophils Absolute: 0.5 K/uL (ref 0.0–0.5)
Eosinophils Relative: 3 %
HCT: 41.2 % (ref 39.0–52.0)
Hemoglobin: 13.6 g/dL (ref 13.0–17.0)
Immature Granulocytes: 1 %
Lymphocytes Relative: 19 %
Lymphs Abs: 2.8 K/uL (ref 0.7–4.0)
MCH: 29.8 pg (ref 26.0–34.0)
MCHC: 33 g/dL (ref 30.0–36.0)
MCV: 90.4 fL (ref 80.0–100.0)
Monocytes Absolute: 1.4 K/uL — ABNORMAL HIGH (ref 0.1–1.0)
Monocytes Relative: 10 %
Neutro Abs: 9.6 K/uL — ABNORMAL HIGH (ref 1.7–7.7)
Neutrophils Relative %: 66 %
Platelets: 329 K/uL (ref 150–400)
RBC: 4.56 MIL/uL (ref 4.22–5.81)
RDW: 14.4 % (ref 11.5–15.5)
WBC: 14.3 K/uL — ABNORMAL HIGH (ref 4.0–10.5)
nRBC: 0 % (ref 0.0–0.2)

## 2023-12-18 LAB — BASIC METABOLIC PANEL WITH GFR
Anion gap: 11 (ref 5–15)
BUN: 19 mg/dL (ref 8–23)
CO2: 26 mmol/L (ref 22–32)
Calcium: 8.5 mg/dL — ABNORMAL LOW (ref 8.9–10.3)
Chloride: 96 mmol/L — ABNORMAL LOW (ref 98–111)
Creatinine, Ser: 0.84 mg/dL (ref 0.61–1.24)
GFR, Estimated: >60 mL/min (ref >60)
Glucose, Bld: 154 mg/dL — ABNORMAL HIGH (ref 70–99)
Potassium: 4 mmol/L (ref 3.5–5.1)
Sodium: 133 mmol/L — ABNORMAL LOW (ref 135–145)

## 2023-12-19 DIAGNOSIS — C7800 Secondary malignant neoplasm of unspecified lung: Secondary | ICD-10-CM | POA: Diagnosis not present

## 2023-12-19 DIAGNOSIS — J90 Pleural effusion, not elsewhere classified: Secondary | ICD-10-CM | POA: Diagnosis not present

## 2023-12-19 DIAGNOSIS — J9621 Acute and chronic respiratory failure with hypoxia: Secondary | ICD-10-CM | POA: Diagnosis not present

## 2023-12-19 DIAGNOSIS — J449 Chronic obstructive pulmonary disease, unspecified: Secondary | ICD-10-CM | POA: Diagnosis not present

## 2023-12-19 LAB — HEMOGLOBIN A1C
Hgb A1c MFr Bld: 6 % — ABNORMAL HIGH (ref 4.8–5.6)
Mean Plasma Glucose: 125.5 mg/dL

## 2023-12-20 DIAGNOSIS — C7800 Secondary malignant neoplasm of unspecified lung: Secondary | ICD-10-CM

## 2023-12-20 DIAGNOSIS — J449 Chronic obstructive pulmonary disease, unspecified: Secondary | ICD-10-CM

## 2023-12-20 DIAGNOSIS — J9621 Acute and chronic respiratory failure with hypoxia: Secondary | ICD-10-CM

## 2023-12-20 DIAGNOSIS — J90 Pleural effusion, not elsewhere classified: Secondary | ICD-10-CM

## 2023-12-21 ENCOUNTER — Institutional Professional Consult (permissible substitution) (HOSPITAL_COMMUNITY)

## 2023-12-21 DIAGNOSIS — J9621 Acute and chronic respiratory failure with hypoxia: Secondary | ICD-10-CM

## 2023-12-21 DIAGNOSIS — J90 Pleural effusion, not elsewhere classified: Secondary | ICD-10-CM

## 2023-12-21 DIAGNOSIS — J449 Chronic obstructive pulmonary disease, unspecified: Secondary | ICD-10-CM

## 2023-12-21 DIAGNOSIS — C7800 Secondary malignant neoplasm of unspecified lung: Secondary | ICD-10-CM

## 2023-12-21 NOTE — Progress Notes (Signed)
 PATIENT INFORMATION   Patient Name: Mario [name]  Date of Birth: [birth year]  MRN: [id number]   Date of Admission: 12/15/2023  6:15 PM Length of Stay: 6 Length of Stay: 6   Primary Care Physician: No primary care provider on file.  Attending Physician:BROWN, COREAN BIRCH, MD    SUBJECTIVE   Chief Complaint:  Acute on chronic hypoxic and hypercapnic respiratory failure  Interval Summary:  Mario [name] is a 73 y.o. male that has been admitted to [facility] who presents as a transfer from [facility] where he was admitted there on 12/12/2023 due to worsening SOB.  Apparently he has had multiple hospital admissions with 3 admissions just this past month in October for end-stage COPD.  In the emergency room he was found to be satting well on 8 L high-flow nasal cannula.  His white blood cell count was elevated in his chest x-ray showed no acute findings.  His chest x-ray showed bilateral left mid and lower lung consolidation in right lower lobe consolidation which was not significantly changed to previous imaging.  The patient refused to go home and he was admitted to the hospital for further evaluation they felt he had acute on chronic hypoxic respiratory failure in the setting of end-stage COPD.  He has had recent CT angiogram twice this past month on 10 to and 10 10 that are both negative pulmonary embolism.  Palliative Care Medicine saw him and they recommended symptom management.  He is continued on Brovana  and started on Pulmicort  and refused Incruse and placed on scheduled DuoNebs.  He did have some increased oxygen  requirement where he went to 10 L on high-flow nasal cannula.  He is continued on IV antibiotics with IV Solu-Medrol  and they were unable to rule out pneumonia due to his chronic lung findings.  They would recommend a treat him with IV antibiotics for 7-10 days cover pneumonia and continue his IV steroids.  I have reviewed his previous notes from this month  and on bronchoscopy with biopsy this past spring there was concern for adenocarcinoma of his lung.  He is scheduled to follow up locally with Dr. Emery but wanted transfer services to the [facility] which at that point he did not show up to his appointment and he had been appeared to lost to follow up.  He has presumed recurrent/progressive lung cancer and oncology wants to see him as an outpatient for follow up.  He had some steroid induced hyperglycemia during his hospital stay he is continued on all of his medications for his chronic medical problems including his hypertension and mood disorder.  After further discussion it was decided for him to come to [facility] for further care.   Subjective:  Patient seen and examined.  The patient is on 9 L high-flow nasal cannula refuses to use Oxymizer has stated he uses BiPAP at home.  We will place patient on BiPAP now.  Patient desaturates with any exertion OBJECTIVE   Objective  Vital Signs:  BP 124/64   Pulse 90   Temp 97 F (36.1 C) (Axillary)   Resp 22   Ht 6' 2 (1.88 m)   Wt 284 lb 13.4 oz (129.2 kg)   SpO2 89%   BMI 36.57 kg/m    I/O last 24 Hours:  Intake/Output Summary (Last 24 hours) at 12/21/2023 1005 Last data filed at 12/21/2023 0836 Gross per 24 hour  Intake 1680 ml  Output 1000 ml  Net  680 ml     Physical Exam:  Elderly well-nourished well-developed gentleman lying in bed in no obvious distress HEENT:  Normocephalic atraumatic, pupils equal round and reactive, EOMs intact, nares  patent, oropharynx is clear  Neck :  Supple Cardiovascular : S1-S2 no murmurs rubs or gallops Lungs:  Decreased breath sounds at bases bilaterally Abdomen: positive bowel sounds soft Extremities: no clubbing cyanosis or edema  Neuro:  awake, alert and oriented   FiO2 (%):  [60 %] 60 %   Labs:  No new labs 12/21/23   Xrays:  CXR 12/16/2023:  Similar asymmetric airspace disease left greater than right which could reflect asymmetric  edema or diffuse infection.   ASSESSMENT AND PLAN   Mario [name] is a 73 y.o. male  who was admitted on 12/15/2023 with Acute on chronic hypoxemic and hypercapnic respiratory failure [J96.22, J96.21] .    Assessment/Plan:  Acute on chronic hypoxic respiratory failure/end-stage COPD/radiation fibrosis.  He has had multiple admissions to the hospital over the last several months for COPD exacerbation with the end-stage COPD.  On 8 L nasal cannula at home.  Pulmonary Medicine with palliative Care Medicine recommended continued symptom management.  He completed  oral azithromycin  12/18/2023.  Cont'd on prednisone  with taper. The patient is on 9 L high-flow nasal cannula.  We will start patient on BiPAP.  We will continue patient on budesonide  and DuoNebs.  RT/pulmonology continued to follow   Metastatic lung adenocarcinoma.  He has presumed recurrent/progressive lung cancer where he had a bronchoscopy with biopsy this past spring concerning for adenocarcinoma.  He has been lost to follow up with Oncology and we will [facility] or a local oncologist here in town once he is discharged.   Stage 3 chronic kidney disease.  We will continue to monitor renal function.  Avoid nephrotoxic medications   Depression/anxiety.  Continue Wellbutrin  XL with BuSpar .  He is also on Trileptal  and  trazodone  at night for sleep.   Hypertension.  We will continue patient on diltiazem .    Type 2 diabetes.  Hemoglobin A1c is 6.  Continue Accu-Chek sliding scale insulin .  He remains on gabapentin  for his neuropathy.   Debility.  Continue PT and OT   Prevention.  He is currently on heparin  for DVT prophylaxis and omeprazole  for GI prophylaxis  Constipation:  Patient is started on MiraLax  b.i.d., continued on senna and started on bisacodyl    Code Status:  Limited Resuscitation    Spent a total of 50 minutes on this encounter. This includes reviewing patient's extensive  history, assessment and visit with the patient as well as documentation time. Time spent also includes IDT collaboration.    SIGNATURE   Electronically signed:  DELORES COREAN BIRCH, MD  12/21/2023 at 10:05 AM EDT

## 2023-12-22 DIAGNOSIS — J90 Pleural effusion, not elsewhere classified: Secondary | ICD-10-CM

## 2023-12-22 DIAGNOSIS — J9621 Acute and chronic respiratory failure with hypoxia: Secondary | ICD-10-CM

## 2023-12-22 DIAGNOSIS — J449 Chronic obstructive pulmonary disease, unspecified: Secondary | ICD-10-CM

## 2023-12-22 DIAGNOSIS — C7800 Secondary malignant neoplasm of unspecified lung: Secondary | ICD-10-CM

## 2023-12-23 DIAGNOSIS — J90 Pleural effusion, not elsewhere classified: Secondary | ICD-10-CM

## 2023-12-23 DIAGNOSIS — J9621 Acute and chronic respiratory failure with hypoxia: Secondary | ICD-10-CM

## 2023-12-23 DIAGNOSIS — J449 Chronic obstructive pulmonary disease, unspecified: Secondary | ICD-10-CM

## 2023-12-23 DIAGNOSIS — C7800 Secondary malignant neoplasm of unspecified lung: Secondary | ICD-10-CM

## 2023-12-23 LAB — BASIC METABOLIC PANEL WITH GFR
Anion gap: 13 (ref 5–15)
BUN: 20 mg/dL (ref 8–23)
CO2: 26 mmol/L (ref 22–32)
Calcium: 8.4 mg/dL — ABNORMAL LOW (ref 8.9–10.3)
Chloride: 89 mmol/L — ABNORMAL LOW (ref 98–111)
Creatinine, Ser: 1.02 mg/dL (ref 0.61–1.24)
GFR, Estimated: >60 mL/min (ref >60)
Glucose, Bld: 128 mg/dL — ABNORMAL HIGH (ref 70–99)
Potassium: 4.4 mmol/L (ref 3.5–5.1)
Sodium: 128 mmol/L — ABNORMAL LOW (ref 135–145)

## 2023-12-23 LAB — CBC
HCT: 41.6 % (ref 39.0–52.0)
Hemoglobin: 13.8 g/dL (ref 13.0–17.0)
MCH: 29.8 pg (ref 26.0–34.0)
MCHC: 33.2 g/dL (ref 30.0–36.0)
MCV: 89.8 fL (ref 80.0–100.0)
Platelets: 341 K/uL (ref 150–400)
RBC: 4.63 MIL/uL (ref 4.22–5.81)
RDW: 14.2 % (ref 11.5–15.5)
WBC: 15.9 K/uL — ABNORMAL HIGH (ref 4.0–10.5)
nRBC: 0 % (ref 0.0–0.2)

## 2023-12-23 LAB — MAGNESIUM: Magnesium: 1.9 mg/dL (ref 1.7–2.4)

## 2023-12-25 DIAGNOSIS — J90 Pleural effusion, not elsewhere classified: Secondary | ICD-10-CM

## 2023-12-25 DIAGNOSIS — J449 Chronic obstructive pulmonary disease, unspecified: Secondary | ICD-10-CM

## 2023-12-25 DIAGNOSIS — J9621 Acute and chronic respiratory failure with hypoxia: Secondary | ICD-10-CM

## 2023-12-25 DIAGNOSIS — C7800 Secondary malignant neoplasm of unspecified lung: Secondary | ICD-10-CM

## 2023-12-27 ENCOUNTER — Institutional Professional Consult (permissible substitution) (HOSPITAL_COMMUNITY)

## 2023-12-27 DIAGNOSIS — C7800 Secondary malignant neoplasm of unspecified lung: Secondary | ICD-10-CM

## 2023-12-27 DIAGNOSIS — J90 Pleural effusion, not elsewhere classified: Secondary | ICD-10-CM

## 2023-12-27 DIAGNOSIS — J9621 Acute and chronic respiratory failure with hypoxia: Secondary | ICD-10-CM

## 2023-12-27 DIAGNOSIS — J449 Chronic obstructive pulmonary disease, unspecified: Secondary | ICD-10-CM

## 2023-12-28 DIAGNOSIS — J90 Pleural effusion, not elsewhere classified: Secondary | ICD-10-CM

## 2023-12-28 DIAGNOSIS — J9621 Acute and chronic respiratory failure with hypoxia: Secondary | ICD-10-CM

## 2023-12-28 DIAGNOSIS — C7800 Secondary malignant neoplasm of unspecified lung: Secondary | ICD-10-CM

## 2023-12-28 DIAGNOSIS — J449 Chronic obstructive pulmonary disease, unspecified: Secondary | ICD-10-CM

## 2023-12-29 DIAGNOSIS — C7800 Secondary malignant neoplasm of unspecified lung: Secondary | ICD-10-CM

## 2023-12-29 DIAGNOSIS — J449 Chronic obstructive pulmonary disease, unspecified: Secondary | ICD-10-CM

## 2023-12-29 DIAGNOSIS — J90 Pleural effusion, not elsewhere classified: Secondary | ICD-10-CM

## 2023-12-29 DIAGNOSIS — J9621 Acute and chronic respiratory failure with hypoxia: Secondary | ICD-10-CM

## 2023-12-29 LAB — CBC WITH DIFFERENTIAL/PLATELET
Abs Immature Granulocytes: 0.1 K/uL — ABNORMAL HIGH (ref 0.00–0.07)
Basophils Absolute: 0.1 K/uL (ref 0.0–0.1)
Basophils Relative: 1 %
Eosinophils Absolute: 0.6 K/uL — ABNORMAL HIGH (ref 0.0–0.5)
Eosinophils Relative: 4 %
HCT: 46.8 % (ref 39.0–52.0)
Hemoglobin: 15.2 g/dL (ref 13.0–17.0)
Immature Granulocytes: 1 %
Lymphocytes Relative: 15 %
Lymphs Abs: 1.9 K/uL (ref 0.7–4.0)
MCH: 29.7 pg (ref 26.0–34.0)
MCHC: 32.5 g/dL (ref 30.0–36.0)
MCV: 91.4 fL (ref 80.0–100.0)
Monocytes Absolute: 1 K/uL (ref 0.1–1.0)
Monocytes Relative: 8 %
Neutro Abs: 8.8 K/uL — ABNORMAL HIGH (ref 1.7–7.7)
Neutrophils Relative %: 71 %
Platelets: 321 K/uL (ref 150–400)
RBC: 5.12 MIL/uL (ref 4.22–5.81)
RDW: 13.8 % (ref 11.5–15.5)
WBC: 12.5 K/uL — ABNORMAL HIGH (ref 4.0–10.5)
nRBC: 0 % (ref 0.0–0.2)

## 2023-12-29 LAB — BASIC METABOLIC PANEL WITH GFR
Anion gap: 13 (ref 5–15)
BUN: 17 mg/dL (ref 8–23)
CO2: 28 mmol/L (ref 22–32)
Calcium: 8.8 mg/dL — ABNORMAL LOW (ref 8.9–10.3)
Chloride: 91 mmol/L — ABNORMAL LOW (ref 98–111)
Creatinine, Ser: 0.83 mg/dL (ref 0.61–1.24)
GFR, Estimated: >60 mL/min (ref >60)
Glucose, Bld: 140 mg/dL — ABNORMAL HIGH (ref 70–99)
Potassium: 4 mmol/L (ref 3.5–5.1)
Sodium: 132 mmol/L — ABNORMAL LOW (ref 135–145)

## 2023-12-30 ENCOUNTER — Institutional Professional Consult (permissible substitution) (HOSPITAL_COMMUNITY)

## 2023-12-30 DIAGNOSIS — C7800 Secondary malignant neoplasm of unspecified lung: Secondary | ICD-10-CM

## 2023-12-30 DIAGNOSIS — J449 Chronic obstructive pulmonary disease, unspecified: Secondary | ICD-10-CM

## 2023-12-30 DIAGNOSIS — J9621 Acute and chronic respiratory failure with hypoxia: Secondary | ICD-10-CM

## 2023-12-30 DIAGNOSIS — J90 Pleural effusion, not elsewhere classified: Secondary | ICD-10-CM

## 2023-12-30 DEATH — deceased

## 2023-12-31 DIAGNOSIS — J90 Pleural effusion, not elsewhere classified: Secondary | ICD-10-CM

## 2023-12-31 DIAGNOSIS — C7800 Secondary malignant neoplasm of unspecified lung: Secondary | ICD-10-CM

## 2023-12-31 DIAGNOSIS — J449 Chronic obstructive pulmonary disease, unspecified: Secondary | ICD-10-CM

## 2023-12-31 DIAGNOSIS — J9621 Acute and chronic respiratory failure with hypoxia: Secondary | ICD-10-CM

## 2024-01-01 DIAGNOSIS — J449 Chronic obstructive pulmonary disease, unspecified: Secondary | ICD-10-CM

## 2024-01-01 DIAGNOSIS — C7800 Secondary malignant neoplasm of unspecified lung: Secondary | ICD-10-CM

## 2024-01-01 DIAGNOSIS — J90 Pleural effusion, not elsewhere classified: Secondary | ICD-10-CM

## 2024-01-01 DIAGNOSIS — J9621 Acute and chronic respiratory failure with hypoxia: Secondary | ICD-10-CM

## 2024-01-02 DIAGNOSIS — J449 Chronic obstructive pulmonary disease, unspecified: Secondary | ICD-10-CM

## 2024-01-02 DIAGNOSIS — C7800 Secondary malignant neoplasm of unspecified lung: Secondary | ICD-10-CM

## 2024-01-02 DIAGNOSIS — J9621 Acute and chronic respiratory failure with hypoxia: Secondary | ICD-10-CM

## 2024-01-02 DIAGNOSIS — J90 Pleural effusion, not elsewhere classified: Secondary | ICD-10-CM

## 2024-01-03 DIAGNOSIS — J449 Chronic obstructive pulmonary disease, unspecified: Secondary | ICD-10-CM

## 2024-01-03 DIAGNOSIS — J90 Pleural effusion, not elsewhere classified: Secondary | ICD-10-CM

## 2024-01-03 DIAGNOSIS — J9621 Acute and chronic respiratory failure with hypoxia: Secondary | ICD-10-CM

## 2024-01-03 DIAGNOSIS — C7800 Secondary malignant neoplasm of unspecified lung: Secondary | ICD-10-CM

## 2024-01-04 DIAGNOSIS — C7800 Secondary malignant neoplasm of unspecified lung: Secondary | ICD-10-CM

## 2024-01-04 DIAGNOSIS — J9621 Acute and chronic respiratory failure with hypoxia: Secondary | ICD-10-CM

## 2024-01-04 DIAGNOSIS — J449 Chronic obstructive pulmonary disease, unspecified: Secondary | ICD-10-CM

## 2024-01-04 DIAGNOSIS — J90 Pleural effusion, not elsewhere classified: Secondary | ICD-10-CM

## 2024-01-05 DIAGNOSIS — J90 Pleural effusion, not elsewhere classified: Secondary | ICD-10-CM

## 2024-01-05 DIAGNOSIS — C7800 Secondary malignant neoplasm of unspecified lung: Secondary | ICD-10-CM

## 2024-01-05 DIAGNOSIS — J9621 Acute and chronic respiratory failure with hypoxia: Secondary | ICD-10-CM

## 2024-01-05 DIAGNOSIS — J449 Chronic obstructive pulmonary disease, unspecified: Secondary | ICD-10-CM

## 2024-01-06 DIAGNOSIS — C7800 Secondary malignant neoplasm of unspecified lung: Secondary | ICD-10-CM

## 2024-01-06 DIAGNOSIS — J9621 Acute and chronic respiratory failure with hypoxia: Secondary | ICD-10-CM

## 2024-01-06 DIAGNOSIS — J449 Chronic obstructive pulmonary disease, unspecified: Secondary | ICD-10-CM

## 2024-01-06 DIAGNOSIS — J90 Pleural effusion, not elsewhere classified: Secondary | ICD-10-CM

## 2024-01-07 DIAGNOSIS — C7800 Secondary malignant neoplasm of unspecified lung: Secondary | ICD-10-CM

## 2024-01-07 DIAGNOSIS — J90 Pleural effusion, not elsewhere classified: Secondary | ICD-10-CM

## 2024-01-07 DIAGNOSIS — J449 Chronic obstructive pulmonary disease, unspecified: Secondary | ICD-10-CM

## 2024-01-07 DIAGNOSIS — J9621 Acute and chronic respiratory failure with hypoxia: Secondary | ICD-10-CM

## 2024-01-07 LAB — CBC WITH DIFFERENTIAL/PLATELET
Abs Immature Granulocytes: 0.05 K/uL (ref 0.00–0.07)
Basophils Absolute: 0 K/uL (ref 0.0–0.1)
Basophils Relative: 0 %
Eosinophils Absolute: 0 K/uL (ref 0.0–0.5)
Eosinophils Relative: 0 %
HCT: 41.5 % (ref 39.0–52.0)
Hemoglobin: 13.7 g/dL (ref 13.0–17.0)
Immature Granulocytes: 1 %
Lymphocytes Relative: 8 %
Lymphs Abs: 0.8 K/uL (ref 0.7–4.0)
MCH: 29.7 pg (ref 26.0–34.0)
MCHC: 33 g/dL (ref 30.0–36.0)
MCV: 90 fL (ref 80.0–100.0)
Monocytes Absolute: 0.2 K/uL (ref 0.1–1.0)
Monocytes Relative: 2 %
Neutro Abs: 9 K/uL — ABNORMAL HIGH (ref 1.7–7.7)
Neutrophils Relative %: 89 %
Platelets: 388 K/uL (ref 150–400)
RBC: 4.61 MIL/uL (ref 4.22–5.81)
RDW: 13.4 % (ref 11.5–15.5)
WBC: 10 K/uL (ref 4.0–10.5)
nRBC: 0 % (ref 0.0–0.2)

## 2024-01-07 LAB — COMPREHENSIVE METABOLIC PANEL WITH GFR
ALT: 22 U/L (ref 0–44)
AST: 18 U/L (ref 15–41)
Albumin: 2.7 g/dL — ABNORMAL LOW (ref 3.5–5.0)
Alkaline Phosphatase: 81 U/L (ref 38–126)
Anion gap: 13 (ref 5–15)
BUN: 15 mg/dL (ref 8–23)
CO2: 25 mmol/L (ref 22–32)
Calcium: 8.9 mg/dL (ref 8.9–10.3)
Chloride: 91 mmol/L — ABNORMAL LOW (ref 98–111)
Creatinine, Ser: 0.9 mg/dL (ref 0.61–1.24)
GFR, Estimated: >60 mL/min (ref >60)
Glucose, Bld: 221 mg/dL — ABNORMAL HIGH (ref 70–99)
Potassium: 4.7 mmol/L (ref 3.5–5.1)
Sodium: 129 mmol/L — ABNORMAL LOW (ref 135–145)
Total Bilirubin: 0.3 mg/dL (ref 0.0–1.2)
Total Protein: 6.3 g/dL — ABNORMAL LOW (ref 6.5–8.1)

## 2024-01-08 DIAGNOSIS — J449 Chronic obstructive pulmonary disease, unspecified: Secondary | ICD-10-CM

## 2024-01-08 DIAGNOSIS — J9621 Acute and chronic respiratory failure with hypoxia: Secondary | ICD-10-CM

## 2024-01-08 DIAGNOSIS — J90 Pleural effusion, not elsewhere classified: Secondary | ICD-10-CM

## 2024-01-08 DIAGNOSIS — C7800 Secondary malignant neoplasm of unspecified lung: Secondary | ICD-10-CM

## 2024-01-09 DIAGNOSIS — J449 Chronic obstructive pulmonary disease, unspecified: Secondary | ICD-10-CM

## 2024-01-09 DIAGNOSIS — J90 Pleural effusion, not elsewhere classified: Secondary | ICD-10-CM

## 2024-01-09 DIAGNOSIS — J9621 Acute and chronic respiratory failure with hypoxia: Secondary | ICD-10-CM

## 2024-01-09 DIAGNOSIS — C7800 Secondary malignant neoplasm of unspecified lung: Secondary | ICD-10-CM

## 2024-01-10 ENCOUNTER — Institutional Professional Consult (permissible substitution) (HOSPITAL_COMMUNITY)

## 2024-01-10 DIAGNOSIS — C7800 Secondary malignant neoplasm of unspecified lung: Secondary | ICD-10-CM

## 2024-01-10 DIAGNOSIS — J9621 Acute and chronic respiratory failure with hypoxia: Secondary | ICD-10-CM

## 2024-01-10 DIAGNOSIS — J449 Chronic obstructive pulmonary disease, unspecified: Secondary | ICD-10-CM

## 2024-01-10 DIAGNOSIS — J90 Pleural effusion, not elsewhere classified: Secondary | ICD-10-CM

## 2024-01-11 DIAGNOSIS — C7800 Secondary malignant neoplasm of unspecified lung: Secondary | ICD-10-CM

## 2024-01-11 DIAGNOSIS — J449 Chronic obstructive pulmonary disease, unspecified: Secondary | ICD-10-CM

## 2024-01-11 DIAGNOSIS — J90 Pleural effusion, not elsewhere classified: Secondary | ICD-10-CM

## 2024-01-11 DIAGNOSIS — J9621 Acute and chronic respiratory failure with hypoxia: Secondary | ICD-10-CM

## 2024-01-12 DIAGNOSIS — J9621 Acute and chronic respiratory failure with hypoxia: Secondary | ICD-10-CM

## 2024-01-12 DIAGNOSIS — C7800 Secondary malignant neoplasm of unspecified lung: Secondary | ICD-10-CM

## 2024-01-12 DIAGNOSIS — J90 Pleural effusion, not elsewhere classified: Secondary | ICD-10-CM

## 2024-01-12 DIAGNOSIS — J449 Chronic obstructive pulmonary disease, unspecified: Secondary | ICD-10-CM

## 2024-01-12 LAB — BASIC METABOLIC PANEL WITH GFR
Anion gap: 11 (ref 5–15)
BUN: 27 mg/dL — ABNORMAL HIGH (ref 8–23)
CO2: 25 mmol/L (ref 22–32)
Calcium: 8.8 mg/dL — ABNORMAL LOW (ref 8.9–10.3)
Chloride: 93 mmol/L — ABNORMAL LOW (ref 98–111)
Creatinine, Ser: 0.97 mg/dL (ref 0.61–1.24)
GFR, Estimated: >60 mL/min (ref >60)
Glucose, Bld: 297 mg/dL — ABNORMAL HIGH (ref 70–99)
Potassium: 4.7 mmol/L (ref 3.5–5.1)
Sodium: 129 mmol/L — ABNORMAL LOW (ref 135–145)

## 2024-01-12 LAB — CBC
HCT: 44 % (ref 39.0–52.0)
Hemoglobin: 14.6 g/dL (ref 13.0–17.0)
MCH: 29.6 pg (ref 26.0–34.0)
MCHC: 33.2 g/dL (ref 30.0–36.0)
MCV: 89.1 fL (ref 80.0–100.0)
Platelets: 542 K/uL — ABNORMAL HIGH (ref 150–400)
RBC: 4.94 MIL/uL (ref 4.22–5.81)
RDW: 13.8 % (ref 11.5–15.5)
WBC: 25 K/uL — ABNORMAL HIGH (ref 4.0–10.5)
nRBC: 0 % (ref 0.0–0.2)

## 2024-01-12 LAB — MAGNESIUM: Magnesium: 2.1 mg/dL (ref 1.7–2.4)

## 2024-01-13 LAB — THEOPHYLLINE LEVEL: Theophylline Lvl: 1 ug/mL — ABNORMAL LOW (ref 10.0–20.0)

## 2024-01-14 ENCOUNTER — Institutional Professional Consult (permissible substitution) (HOSPITAL_COMMUNITY)

## 2024-01-14 DIAGNOSIS — J9621 Acute and chronic respiratory failure with hypoxia: Secondary | ICD-10-CM

## 2024-01-14 DIAGNOSIS — J449 Chronic obstructive pulmonary disease, unspecified: Secondary | ICD-10-CM

## 2024-01-14 DIAGNOSIS — C7800 Secondary malignant neoplasm of unspecified lung: Secondary | ICD-10-CM

## 2024-01-14 DIAGNOSIS — J90 Pleural effusion, not elsewhere classified: Secondary | ICD-10-CM

## 2024-01-14 LAB — CBC WITH DIFFERENTIAL/PLATELET
Abs Immature Granulocytes: 1.23 K/uL — ABNORMAL HIGH (ref 0.00–0.07)
Basophils Absolute: 0.2 K/uL — ABNORMAL HIGH (ref 0.0–0.1)
Basophils Relative: 1 %
Eosinophils Absolute: 0.1 K/uL (ref 0.0–0.5)
Eosinophils Relative: 0 %
HCT: 42.2 % (ref 39.0–52.0)
Hemoglobin: 14.4 g/dL (ref 13.0–17.0)
Immature Granulocytes: 4 %
Lymphocytes Relative: 7 %
Lymphs Abs: 1.9 K/uL (ref 0.7–4.0)
MCH: 30 pg (ref 26.0–34.0)
MCHC: 34.1 g/dL (ref 30.0–36.0)
MCV: 87.9 fL (ref 80.0–100.0)
Monocytes Absolute: 1.6 K/uL — ABNORMAL HIGH (ref 0.1–1.0)
Monocytes Relative: 6 %
Neutro Abs: 23.6 K/uL — ABNORMAL HIGH (ref 1.7–7.7)
Neutrophils Relative %: 82 %
Platelets: 411 K/uL — ABNORMAL HIGH (ref 150–400)
RBC: 4.8 MIL/uL (ref 4.22–5.81)
RDW: 13.6 % (ref 11.5–15.5)
WBC: 28.5 K/uL — ABNORMAL HIGH (ref 4.0–10.5)
nRBC: 0 % (ref 0.0–0.2)

## 2024-01-14 LAB — COMPREHENSIVE METABOLIC PANEL WITH GFR
ALT: 15 U/L (ref 0–44)
AST: 16 U/L (ref 15–41)
Albumin: 3 g/dL — ABNORMAL LOW (ref 3.5–5.0)
Alkaline Phosphatase: 59 U/L (ref 38–126)
Anion gap: 13 (ref 5–15)
BUN: 27 mg/dL — ABNORMAL HIGH (ref 8–23)
CO2: 26 mmol/L (ref 22–32)
Calcium: 8.7 mg/dL — ABNORMAL LOW (ref 8.9–10.3)
Chloride: 90 mmol/L — ABNORMAL LOW (ref 98–111)
Creatinine, Ser: 0.99 mg/dL (ref 0.61–1.24)
GFR, Estimated: >60 mL/min (ref >60)
Glucose, Bld: 308 mg/dL — ABNORMAL HIGH (ref 70–99)
Potassium: 4.9 mmol/L (ref 3.5–5.1)
Sodium: 129 mmol/L — ABNORMAL LOW (ref 135–145)
Total Bilirubin: 0.4 mg/dL (ref 0.0–1.2)
Total Protein: 6.2 g/dL — ABNORMAL LOW (ref 6.5–8.1)

## 2024-01-15 ENCOUNTER — Institutional Professional Consult (permissible substitution) (HOSPITAL_COMMUNITY)

## 2024-01-15 DIAGNOSIS — J9621 Acute and chronic respiratory failure with hypoxia: Secondary | ICD-10-CM

## 2024-01-15 DIAGNOSIS — C7800 Secondary malignant neoplasm of unspecified lung: Secondary | ICD-10-CM

## 2024-01-15 DIAGNOSIS — J449 Chronic obstructive pulmonary disease, unspecified: Secondary | ICD-10-CM

## 2024-01-15 DIAGNOSIS — R609 Edema, unspecified: Secondary | ICD-10-CM

## 2024-01-15 DIAGNOSIS — J90 Pleural effusion, not elsewhere classified: Secondary | ICD-10-CM

## 2024-01-15 NOTE — Progress Notes (Signed)
 VASCULAR LAB    Bilateral lower extremity venous duplex has been performed.  See CV proc for preliminary results.   Rhiana Morash, RVT 01/15/2024, 6:06 PM

## 2024-01-16 DIAGNOSIS — C7800 Secondary malignant neoplasm of unspecified lung: Secondary | ICD-10-CM

## 2024-01-16 DIAGNOSIS — J449 Chronic obstructive pulmonary disease, unspecified: Secondary | ICD-10-CM

## 2024-01-16 DIAGNOSIS — J9621 Acute and chronic respiratory failure with hypoxia: Secondary | ICD-10-CM

## 2024-01-16 DIAGNOSIS — J90 Pleural effusion, not elsewhere classified: Secondary | ICD-10-CM

## 2024-01-17 DIAGNOSIS — J449 Chronic obstructive pulmonary disease, unspecified: Secondary | ICD-10-CM

## 2024-01-17 DIAGNOSIS — J90 Pleural effusion, not elsewhere classified: Secondary | ICD-10-CM

## 2024-01-17 DIAGNOSIS — J9621 Acute and chronic respiratory failure with hypoxia: Secondary | ICD-10-CM

## 2024-01-17 DIAGNOSIS — C7800 Secondary malignant neoplasm of unspecified lung: Secondary | ICD-10-CM

## 2024-01-18 DIAGNOSIS — C7800 Secondary malignant neoplasm of unspecified lung: Secondary | ICD-10-CM

## 2024-01-18 DIAGNOSIS — J9621 Acute and chronic respiratory failure with hypoxia: Secondary | ICD-10-CM

## 2024-01-18 DIAGNOSIS — J449 Chronic obstructive pulmonary disease, unspecified: Secondary | ICD-10-CM

## 2024-01-18 DIAGNOSIS — J90 Pleural effusion, not elsewhere classified: Secondary | ICD-10-CM

## 2024-01-19 DIAGNOSIS — J9621 Acute and chronic respiratory failure with hypoxia: Secondary | ICD-10-CM

## 2024-01-19 DIAGNOSIS — J90 Pleural effusion, not elsewhere classified: Secondary | ICD-10-CM

## 2024-01-19 DIAGNOSIS — C7800 Secondary malignant neoplasm of unspecified lung: Secondary | ICD-10-CM

## 2024-01-19 DIAGNOSIS — J449 Chronic obstructive pulmonary disease, unspecified: Secondary | ICD-10-CM

## 2024-01-20 DIAGNOSIS — C7800 Secondary malignant neoplasm of unspecified lung: Secondary | ICD-10-CM

## 2024-01-20 DIAGNOSIS — J90 Pleural effusion, not elsewhere classified: Secondary | ICD-10-CM

## 2024-01-20 DIAGNOSIS — J9621 Acute and chronic respiratory failure with hypoxia: Secondary | ICD-10-CM

## 2024-01-20 DIAGNOSIS — J449 Chronic obstructive pulmonary disease, unspecified: Secondary | ICD-10-CM

## 2024-01-21 DIAGNOSIS — C7800 Secondary malignant neoplasm of unspecified lung: Secondary | ICD-10-CM

## 2024-01-21 DIAGNOSIS — J9621 Acute and chronic respiratory failure with hypoxia: Secondary | ICD-10-CM

## 2024-01-21 DIAGNOSIS — J449 Chronic obstructive pulmonary disease, unspecified: Secondary | ICD-10-CM

## 2024-01-21 DIAGNOSIS — J90 Pleural effusion, not elsewhere classified: Secondary | ICD-10-CM

## 2024-01-22 ENCOUNTER — Institutional Professional Consult (permissible substitution) (HOSPITAL_COMMUNITY)

## 2024-01-22 DIAGNOSIS — J90 Pleural effusion, not elsewhere classified: Secondary | ICD-10-CM

## 2024-01-22 DIAGNOSIS — J449 Chronic obstructive pulmonary disease, unspecified: Secondary | ICD-10-CM

## 2024-01-22 DIAGNOSIS — J9621 Acute and chronic respiratory failure with hypoxia: Secondary | ICD-10-CM

## 2024-01-22 DIAGNOSIS — C7800 Secondary malignant neoplasm of unspecified lung: Secondary | ICD-10-CM

## 2024-01-22 LAB — CBC WITH DIFFERENTIAL/PLATELET
Abs Immature Granulocytes: 0.51 K/uL — ABNORMAL HIGH (ref 0.00–0.07)
Basophils Absolute: 0.1 K/uL (ref 0.0–0.1)
Basophils Relative: 0 %
Eosinophils Absolute: 0.4 K/uL (ref 0.0–0.5)
Eosinophils Relative: 2 %
HCT: 43.6 % (ref 39.0–52.0)
Hemoglobin: 14.6 g/dL (ref 13.0–17.0)
Immature Granulocytes: 3 %
Lymphocytes Relative: 17 %
Lymphs Abs: 3.6 K/uL (ref 0.7–4.0)
MCH: 29.7 pg (ref 26.0–34.0)
MCHC: 33.5 g/dL (ref 30.0–36.0)
MCV: 88.8 fL (ref 80.0–100.0)
Monocytes Absolute: 1.5 K/uL — ABNORMAL HIGH (ref 0.1–1.0)
Monocytes Relative: 7 %
Neutro Abs: 14.6 K/uL — ABNORMAL HIGH (ref 1.7–7.7)
Neutrophils Relative %: 71 %
Platelets: 299 K/uL (ref 150–400)
RBC: 4.91 MIL/uL (ref 4.22–5.81)
RDW: 14 % (ref 11.5–15.5)
WBC: 20.6 K/uL — ABNORMAL HIGH (ref 4.0–10.5)
nRBC: 0 % (ref 0.0–0.2)

## 2024-01-22 LAB — COMPREHENSIVE METABOLIC PANEL WITH GFR
ALT: 18 U/L (ref 0–44)
AST: 15 U/L (ref 15–41)
Albumin: 2.8 g/dL — ABNORMAL LOW (ref 3.5–5.0)
Alkaline Phosphatase: 50 U/L (ref 38–126)
Anion gap: 10 (ref 5–15)
BUN: 19 mg/dL (ref 8–23)
CO2: 28 mmol/L (ref 22–32)
Calcium: 8.5 mg/dL — ABNORMAL LOW (ref 8.9–10.3)
Chloride: 88 mmol/L — ABNORMAL LOW (ref 98–111)
Creatinine, Ser: 0.84 mg/dL (ref 0.61–1.24)
GFR, Estimated: >60 mL/min (ref >60)
Glucose, Bld: 206 mg/dL — ABNORMAL HIGH (ref 70–99)
Potassium: 4.8 mmol/L (ref 3.5–5.1)
Sodium: 126 mmol/L — ABNORMAL LOW (ref 135–145)
Total Bilirubin: 0.5 mg/dL (ref 0.0–1.2)
Total Protein: 5.8 g/dL — ABNORMAL LOW (ref 6.5–8.1)

## 2024-01-23 DIAGNOSIS — C7800 Secondary malignant neoplasm of unspecified lung: Secondary | ICD-10-CM

## 2024-01-23 DIAGNOSIS — J90 Pleural effusion, not elsewhere classified: Secondary | ICD-10-CM

## 2024-01-23 DIAGNOSIS — J449 Chronic obstructive pulmonary disease, unspecified: Secondary | ICD-10-CM

## 2024-01-23 DIAGNOSIS — J9621 Acute and chronic respiratory failure with hypoxia: Secondary | ICD-10-CM

## 2024-01-24 ENCOUNTER — Institutional Professional Consult (permissible substitution) (HOSPITAL_COMMUNITY)

## 2024-01-24 DIAGNOSIS — J9621 Acute and chronic respiratory failure with hypoxia: Secondary | ICD-10-CM

## 2024-01-24 DIAGNOSIS — C7800 Secondary malignant neoplasm of unspecified lung: Secondary | ICD-10-CM

## 2024-01-24 DIAGNOSIS — J90 Pleural effusion, not elsewhere classified: Secondary | ICD-10-CM

## 2024-01-24 DIAGNOSIS — J449 Chronic obstructive pulmonary disease, unspecified: Secondary | ICD-10-CM

## 2024-01-25 DIAGNOSIS — J90 Pleural effusion, not elsewhere classified: Secondary | ICD-10-CM

## 2024-01-25 DIAGNOSIS — J449 Chronic obstructive pulmonary disease, unspecified: Secondary | ICD-10-CM

## 2024-01-25 DIAGNOSIS — J9621 Acute and chronic respiratory failure with hypoxia: Secondary | ICD-10-CM

## 2024-01-25 DIAGNOSIS — C7800 Secondary malignant neoplasm of unspecified lung: Secondary | ICD-10-CM

## 2024-01-26 DIAGNOSIS — J90 Pleural effusion, not elsewhere classified: Secondary | ICD-10-CM

## 2024-01-26 DIAGNOSIS — J9621 Acute and chronic respiratory failure with hypoxia: Secondary | ICD-10-CM

## 2024-01-26 DIAGNOSIS — J449 Chronic obstructive pulmonary disease, unspecified: Secondary | ICD-10-CM

## 2024-01-26 DIAGNOSIS — C7800 Secondary malignant neoplasm of unspecified lung: Secondary | ICD-10-CM

## 2024-01-27 DIAGNOSIS — J449 Chronic obstructive pulmonary disease, unspecified: Secondary | ICD-10-CM

## 2024-01-27 DIAGNOSIS — J9621 Acute and chronic respiratory failure with hypoxia: Secondary | ICD-10-CM

## 2024-01-27 DIAGNOSIS — C7800 Secondary malignant neoplasm of unspecified lung: Secondary | ICD-10-CM

## 2024-01-27 DIAGNOSIS — J90 Pleural effusion, not elsewhere classified: Secondary | ICD-10-CM

## 2024-01-28 DIAGNOSIS — J9621 Acute and chronic respiratory failure with hypoxia: Secondary | ICD-10-CM

## 2024-01-28 DIAGNOSIS — C7800 Secondary malignant neoplasm of unspecified lung: Secondary | ICD-10-CM

## 2024-01-28 DIAGNOSIS — J449 Chronic obstructive pulmonary disease, unspecified: Secondary | ICD-10-CM

## 2024-01-28 DIAGNOSIS — J90 Pleural effusion, not elsewhere classified: Secondary | ICD-10-CM

## 2024-01-28 LAB — CBC WITH DIFFERENTIAL/PLATELET
Abs Immature Granulocytes: 0.28 K/uL — ABNORMAL HIGH (ref 0.00–0.07)
Basophils Absolute: 0 K/uL (ref 0.0–0.1)
Basophils Relative: 0 %
Eosinophils Absolute: 0.2 K/uL (ref 0.0–0.5)
Eosinophils Relative: 1 %
HCT: 41.9 % (ref 39.0–52.0)
Hemoglobin: 14.1 g/dL (ref 13.0–17.0)
Immature Granulocytes: 2 %
Lymphocytes Relative: 6 %
Lymphs Abs: 0.9 K/uL (ref 0.7–4.0)
MCH: 30 pg (ref 26.0–34.0)
MCHC: 33.7 g/dL (ref 30.0–36.0)
MCV: 89.1 fL (ref 80.0–100.0)
Monocytes Absolute: 0.5 K/uL (ref 0.1–1.0)
Monocytes Relative: 3 %
Neutro Abs: 13.4 K/uL — ABNORMAL HIGH (ref 1.7–7.7)
Neutrophils Relative %: 88 %
Platelets: 202 K/uL (ref 150–400)
RBC: 4.7 MIL/uL (ref 4.22–5.81)
RDW: 13.9 % (ref 11.5–15.5)
WBC: 15.3 K/uL — ABNORMAL HIGH (ref 4.0–10.5)
nRBC: 0 % (ref 0.0–0.2)

## 2024-01-29 DIAGNOSIS — J449 Chronic obstructive pulmonary disease, unspecified: Secondary | ICD-10-CM

## 2024-01-29 DIAGNOSIS — J9621 Acute and chronic respiratory failure with hypoxia: Secondary | ICD-10-CM

## 2024-01-29 DIAGNOSIS — J90 Pleural effusion, not elsewhere classified: Secondary | ICD-10-CM

## 2024-01-29 DIAGNOSIS — C7800 Secondary malignant neoplasm of unspecified lung: Secondary | ICD-10-CM

## 2024-01-30 DIAGNOSIS — J9621 Acute and chronic respiratory failure with hypoxia: Secondary | ICD-10-CM

## 2024-01-30 DIAGNOSIS — J449 Chronic obstructive pulmonary disease, unspecified: Secondary | ICD-10-CM

## 2024-01-30 DIAGNOSIS — J90 Pleural effusion, not elsewhere classified: Secondary | ICD-10-CM

## 2024-01-30 DIAGNOSIS — C7800 Secondary malignant neoplasm of unspecified lung: Secondary | ICD-10-CM

## 2024-01-30 LAB — BASIC METABOLIC PANEL WITH GFR
Anion gap: 11 (ref 5–15)
BUN: 25 mg/dL — ABNORMAL HIGH (ref 8–23)
CO2: 30 mmol/L (ref 22–32)
Calcium: 9 mg/dL (ref 8.9–10.3)
Chloride: 90 mmol/L — ABNORMAL LOW (ref 98–111)
Creatinine, Ser: 0.97 mg/dL (ref 0.61–1.24)
GFR, Estimated: >60 mL/min (ref >60)
Glucose, Bld: 321 mg/dL — ABNORMAL HIGH (ref 70–99)
Potassium: 5.2 mmol/L — ABNORMAL HIGH (ref 3.5–5.1)
Sodium: 131 mmol/L — ABNORMAL LOW (ref 135–145)

## 2024-01-31 DIAGNOSIS — J9621 Acute and chronic respiratory failure with hypoxia: Secondary | ICD-10-CM

## 2024-01-31 DIAGNOSIS — J449 Chronic obstructive pulmonary disease, unspecified: Secondary | ICD-10-CM

## 2024-01-31 DIAGNOSIS — J90 Pleural effusion, not elsewhere classified: Secondary | ICD-10-CM

## 2024-01-31 DIAGNOSIS — C7800 Secondary malignant neoplasm of unspecified lung: Secondary | ICD-10-CM

## 2024-01-31 LAB — URIC ACID: Uric Acid, Serum: 2.9 mg/dL — ABNORMAL LOW (ref 3.7–8.6)

## 2024-02-01 DIAGNOSIS — J9621 Acute and chronic respiratory failure with hypoxia: Secondary | ICD-10-CM

## 2024-02-01 DIAGNOSIS — C7800 Secondary malignant neoplasm of unspecified lung: Secondary | ICD-10-CM

## 2024-02-01 DIAGNOSIS — J449 Chronic obstructive pulmonary disease, unspecified: Secondary | ICD-10-CM

## 2024-02-01 DIAGNOSIS — J90 Pleural effusion, not elsewhere classified: Secondary | ICD-10-CM

## 2024-02-01 LAB — COMPREHENSIVE METABOLIC PANEL WITH GFR
ALT: 23 U/L (ref 0–44)
AST: 17 U/L (ref 15–41)
Albumin: 2.8 g/dL — ABNORMAL LOW (ref 3.5–5.0)
Alkaline Phosphatase: 48 U/L (ref 38–126)
Anion gap: 14 (ref 5–15)
BUN: 23 mg/dL (ref 8–23)
CO2: 24 mmol/L (ref 22–32)
Calcium: 8.8 mg/dL — ABNORMAL LOW (ref 8.9–10.3)
Chloride: 93 mmol/L — ABNORMAL LOW (ref 98–111)
Creatinine, Ser: 0.88 mg/dL (ref 0.61–1.24)
GFR, Estimated: >60 mL/min
Glucose, Bld: 277 mg/dL — ABNORMAL HIGH (ref 70–99)
Potassium: 4.9 mmol/L (ref 3.5–5.1)
Sodium: 131 mmol/L — ABNORMAL LOW (ref 135–145)
Total Bilirubin: 0.7 mg/dL (ref 0.0–1.2)
Total Protein: 5.7 g/dL — ABNORMAL LOW (ref 6.5–8.1)

## 2024-02-01 LAB — CBC WITH DIFFERENTIAL/PLATELET
Abs Immature Granulocytes: 0.74 K/uL — ABNORMAL HIGH (ref 0.00–0.07)
Basophils Absolute: 0.1 K/uL (ref 0.0–0.1)
Basophils Relative: 1 %
Eosinophils Absolute: 0 K/uL (ref 0.0–0.5)
Eosinophils Relative: 0 %
HCT: 45.3 % (ref 39.0–52.0)
Hemoglobin: 15.4 g/dL (ref 13.0–17.0)
Immature Granulocytes: 4 %
Lymphocytes Relative: 11 %
Lymphs Abs: 1.9 K/uL (ref 0.7–4.0)
MCH: 30.3 pg (ref 26.0–34.0)
MCHC: 34 g/dL (ref 30.0–36.0)
MCV: 89 fL (ref 80.0–100.0)
Monocytes Absolute: 0.5 K/uL (ref 0.1–1.0)
Monocytes Relative: 3 %
Neutro Abs: 13.5 K/uL — ABNORMAL HIGH (ref 1.7–7.7)
Neutrophils Relative %: 81 %
Platelets: 276 K/uL (ref 150–400)
RBC: 5.09 MIL/uL (ref 4.22–5.81)
RDW: 14.3 % (ref 11.5–15.5)
WBC: 16.8 K/uL — ABNORMAL HIGH (ref 4.0–10.5)
nRBC: 0.1 % (ref 0.0–0.2)

## 2024-02-01 LAB — MAGNESIUM: Magnesium: 2 mg/dL (ref 1.7–2.4)

## 2024-02-01 LAB — PHOSPHORUS: Phosphorus: 4.3 mg/dL (ref 2.5–4.6)

## 2024-02-03 ENCOUNTER — Institutional Professional Consult (permissible substitution) (HOSPITAL_COMMUNITY)

## 2024-02-03 LAB — CBC WITH DIFFERENTIAL/PLATELET
Abs Immature Granulocytes: 0.54 K/uL — ABNORMAL HIGH (ref 0.00–0.07)
Basophils Absolute: 0.1 K/uL (ref 0.0–0.1)
Basophils Relative: 1 %
Eosinophils Absolute: 0.1 K/uL (ref 0.0–0.5)
Eosinophils Relative: 0 %
HCT: 43.2 % (ref 39.0–52.0)
Hemoglobin: 14.1 g/dL (ref 13.0–17.0)
Immature Granulocytes: 3 %
Lymphocytes Relative: 8 %
Lymphs Abs: 1.5 K/uL (ref 0.7–4.0)
MCH: 29.8 pg (ref 26.0–34.0)
MCHC: 32.6 g/dL (ref 30.0–36.0)
MCV: 91.3 fL (ref 80.0–100.0)
Monocytes Absolute: 0.8 K/uL (ref 0.1–1.0)
Monocytes Relative: 4 %
Neutro Abs: 15.9 K/uL — ABNORMAL HIGH (ref 1.7–7.7)
Neutrophils Relative %: 84 %
Platelets: 217 K/uL (ref 150–400)
RBC: 4.73 MIL/uL (ref 4.22–5.81)
RDW: 14.4 % (ref 11.5–15.5)
WBC: 19 K/uL — ABNORMAL HIGH (ref 4.0–10.5)
nRBC: 0 % (ref 0.0–0.2)

## 2024-02-03 LAB — ECHOCARDIOGRAM COMPLETE
AR max vel: 2.79 cm2
AV Peak grad: 7.1 mmHg
Ao pk vel: 1.33 m/s
Area-P 1/2: 6.02 cm2
S' Lateral: 3.6 cm

## 2024-02-03 LAB — BASIC METABOLIC PANEL WITH GFR
Anion gap: 17 — ABNORMAL HIGH (ref 5–15)
BUN: 25 mg/dL — ABNORMAL HIGH (ref 8–23)
CO2: 18 mmol/L — ABNORMAL LOW (ref 22–32)
Calcium: 8.3 mg/dL — ABNORMAL LOW (ref 8.9–10.3)
Chloride: 96 mmol/L — ABNORMAL LOW (ref 98–111)
Creatinine, Ser: 0.97 mg/dL (ref 0.61–1.24)
GFR, Estimated: >60 mL/min (ref >60)
Glucose, Bld: 362 mg/dL — ABNORMAL HIGH (ref 70–99)
Potassium: 5.3 mmol/L — ABNORMAL HIGH (ref 3.5–5.1)
Sodium: 131 mmol/L — ABNORMAL LOW (ref 135–145)

## 2024-02-03 LAB — MAGNESIUM: Magnesium: 2.1 mg/dL (ref 1.7–2.4)

## 2024-02-04 LAB — BASIC METABOLIC PANEL WITH GFR
Anion gap: 10 (ref 5–15)
BUN: 21 mg/dL (ref 8–23)
CO2: 27 mmol/L (ref 22–32)
Calcium: 8.2 mg/dL — ABNORMAL LOW (ref 8.9–10.3)
Chloride: 92 mmol/L — ABNORMAL LOW (ref 98–111)
Creatinine, Ser: 0.88 mg/dL (ref 0.61–1.24)
GFR, Estimated: >60 mL/min (ref >60)
Glucose, Bld: 329 mg/dL — ABNORMAL HIGH (ref 70–99)
Potassium: 5.2 mmol/L — ABNORMAL HIGH (ref 3.5–5.1)
Sodium: 129 mmol/L — ABNORMAL LOW (ref 135–145)

## 2024-02-05 LAB — CBC
HCT: 43.7 % (ref 39.0–52.0)
Hemoglobin: 14.6 g/dL (ref 13.0–17.0)
MCH: 30 pg (ref 26.0–34.0)
MCHC: 33.4 g/dL (ref 30.0–36.0)
MCV: 89.7 fL (ref 80.0–100.0)
Platelets: 299 K/uL (ref 150–400)
RBC: 4.87 MIL/uL (ref 4.22–5.81)
RDW: 14.4 % (ref 11.5–15.5)
WBC: 16 K/uL — ABNORMAL HIGH (ref 4.0–10.5)
nRBC: 0 % (ref 0.0–0.2)

## 2024-02-05 LAB — BASIC METABOLIC PANEL WITH GFR
Anion gap: 14 (ref 5–15)
BUN: 22 mg/dL (ref 8–23)
CO2: 28 mmol/L (ref 22–32)
Calcium: 8.5 mg/dL — ABNORMAL LOW (ref 8.9–10.3)
Chloride: 89 mmol/L — ABNORMAL LOW (ref 98–111)
Creatinine, Ser: 0.93 mg/dL (ref 0.61–1.24)
GFR, Estimated: >60 mL/min (ref >60)
Glucose, Bld: 361 mg/dL — ABNORMAL HIGH (ref 70–99)
Potassium: 4.8 mmol/L (ref 3.5–5.1)
Sodium: 131 mmol/L — ABNORMAL LOW (ref 135–145)

## 2024-02-09 NOTE — Progress Notes (Signed)
 PATIENT INFORMATION   Patient Name: Mario [name]  Date of Birth: 1950/10/25  MRN: [id number]   Date of Admission: 12/15/2023  6:15 PM Length of Stay: 56 Length of Stay: 56   Primary Care Physician: No primary care provider on file.  Attending Physician:VAN EYK, JASON J, MD    SUBJECTIVE   Chief Complaint:  Acute on chronic hypoxic and hypercapnic respiratory failure  Interval Summary:  Mario [name] is a 73 y.o. male that has been admitted to [facility] who presents as a transfer from [facility] where he was admitted there on 12/12/2023 due to worsening SOB.  Apparently he has had multiple hospital admissions with 3 admissions just this past month in October for end-stage COPD.  In the emergency room he was found to be satting well on 8 L high-flow nasal cannula.  His white blood cell count was elevated in his chest x-ray showed no acute findings.  His chest x-ray showed bilateral left mid and lower lung consolidation in right lower lobe consolidation which was not significantly changed to previous imaging.  The patient refused to go home and he was admitted to the hospital for further evaluation they felt he had acute on chronic hypoxic respiratory failure in the setting of end-stage COPD.  He has had recent CT angiogram twice this past month on 10 to and 10 10 that are both negative pulmonary embolism.  Palliative Care Medicine saw him and they recommended symptom management.  He is continued on Brovana  and started on Pulmicort  and refused Incruse and placed on scheduled DuoNebs.  He did have some increased oxygen  requirement where he went to 10 L on high-flow nasal cannula.  He is continued on IV antibiotics with IV Solu-Medrol  and they were unable to rule out pneumonia due to his chronic lung findings.  They would recommend a treat him with IV antibiotics for 7-10 days cover pneumonia and continue his IV steroids.  I have reviewed his previous notes from this month  and on bronchoscopy with biopsy this past spring there was concern for adenocarcinoma of his lung.  He is scheduled to follow up locally with Dr. Emery but wanted transfer services to the [facility] which at that point he did not show up to his appointment and he had been appeared to lost to follow up.  He has presumed recurrent/progressive lung cancer and oncology wants to see him as an outpatient for follow up.  He had some steroid induced hyperglycemia during his hospital stay he is continued on all of his medications for his chronic medical problems including his hypertension and mood disorder.  After further discussion it was decided for him to come to [facility] for further care.   Subjective:  The patient is currently on 12 L high-flow nasal cannula and uses a non-rebreather as needed.  He remains alert and oriented x4.  He is taking p.o. well but states he is having swallowing problems.  No other problems overnight per nursing staff.  OBJECTIVE   Objective  Vital Signs:  BP 147/89   Pulse 125   Temp 98.2 F (36.8 C) (Axillary)   Resp 20   Ht (P) 6' 2 (1.88 m)   Wt 233 lb 11.2 oz (106 kg)   SpO2 90%   BMI (P) 30.01 kg/m    I/O last 24 Hours:  Intake/Output Summary (Last 24 hours) at 02/09/2024 1528 Last data filed at 02/08/2024 1828 Gross per 24 hour  Intake 240 ml  Output --  Net 240 ml     Physical Exam:  Elderly well-developed gentleman sitting up  on the side of bed in no distress  HEENT:  Normocephalic atraumatic, pupils equal round and reactive, EOMs intact, nares patent, oropharynx thrush has disappeared.  Neck :  Supple Cardiovascular : S1-S2 irregular tachycardia no murmurs rubs or gallops Lungs:  Decreased air movement Abdomen: positive bowel sounds soft Extremities: no clubbing cyanosis or edema .  He has a large bruise under his right arm/biceps, right heel tenderness + Neuro:  awake, alert   Labs:    No new labs.  Xrays:  CXR 01/24/2024:  Low lung  volumes with hazy reticular opacities bilaterally significantly worse on the left and scattered patchy opacities in the left mid/lower lung zone and right lower lung zone similar to prior study with no significant changes.    CXR 01/10/2024:  Stable bilateral lung opacities left greater than right suspicious for multifocal pneumonia versus scarring.  CXR 12/27/2023:  Persistent consolidation along the left hemidiaphragm left mid lung and right hemidiaphragm with the emphysema  CXR 12/21/2023:  Stable bilateral airspace disease left greater than right with small left effusion.  CXR 12/16/2023:  Similar asymmetric airspace disease left greater than right which could reflect asymmetric edema or diffuse infection.   ASSESSMENT AND PLAN   Mario [name] is a 73 y.o. male  who was admitted on 12/15/2023 with Acute on chronic hypoxemic and hypercapnic respiratory failure [J96.22, J96.21] .    Assessment/Plan:  Acute on chronic hypoxic respiratory failure/end-stage COPD/radiation fibrosis.  He has had multiple admissions to the hospital over the last several months for COPD exacerbation with the end-stage COPD.  He is on 8 L nasal cannula at home.  Pulmonary Medicine with palliative Care Medicine recommended continued symptom management.  He completed  oral azithromycin  12/18/2023.  The patient is on 10 L high-flow nasal cannula.  We will continue patient on budesonide  and DuoNebs.  He received a 1 time dose of IV Lasix  dose on 12/22/2023 due to pulmonary edema seen on chest x-ray.  He has been started on BiPAP but he is refusing.  He was also started on IV Solu-Medrol  on 11/25.  He has been started on Bactrim 3 times a week for PJP prophylaxis as he has been on high-dose steroids for a long time.  Atrial fibrillation.  The patient went into AFib on 02/02/2024 where he was given IV Cardizem  and placed on oral Cardizem  for rate control. We will continue to monitor patient on tele.  We will obtain a 2D  ECHO.   Dental caries.  He was started on Augmentin  which was completed on 01/31/2024.  Leukocytosis.  He had an elevated white blood cell count on 01/12/2024 with repeat on 01/14/2019 8.5.  He is not having any fevers.  Repeat chest x-ray but he was started on IV steroids on 01/06/2024 in his leukocytosis is probably due to steroid use and continues to decrease.  Metastatic lung adenocarcinoma.  He has presumed recurrent/progressive lung cancer where he had a bronchoscopy with biopsy this past spring concerning for adenocarcinoma.  He has been lost to follow up with Oncology and we will need [facility] or a local oncologist here in town once he is discharged.  His cough is related to his metastatic lung cancer where we are using hydrocodone  for cough and continue on scheduled Robitussin.  He was on morphine  for chest  pain related to his lung cancer but this has been switched over to Dilaudid  PRN.  We will continue to monitor for any worsening pain.  He does have a poor prognosis.   Stage 3 chronic kidney disease.  We will continue to monitor renal function.  Avoid nephrotoxic medications.  Potassium improved today   Depression/anxiety.  Continue Wellbutrin  XL with BuSpar .  He is also on Trileptal  and  trazodone  at night for sleep.  We have increased his trazodone  and his melatonin due to worsening insomnia.   Hypertension.  Continue patient on diltiazem  and lisinopril   Type 2 diabetes.  Hemoglobin A1c is 6.  Continue Accu-Chek sliding scale insulin .  He remains on gabapentin  for his neuropathy.  Dysphagia.  I had a discussion with the speech therapist and we have ordered an esophagram as well as a modified barium swallow.   Debility.  Continue PT and OT therapies.   Prevention.  He is currently on heparin  for DVT prophylaxis and omeprazole  for GI prophylaxis.  Dispo.  Case management is currently involved in his discharge disposition.  His high-flow oxygen  as  a barrier to discharge.  We will continue his current medications and care.     Code Status: DNR    Spent a total of 35  minutes on this encounter. This includes reviewing patient's extensive history, assessment and visit with the patient as well as documentation time. Time spent also includes IDT collaboration.    SIGNATURE   Electronically signed:  VAN EYK, JASON J, MD  02/09/2024 at 3:28 PM EST

## 2024-02-10 ENCOUNTER — Institutional Professional Consult (permissible substitution) (HOSPITAL_COMMUNITY)

## 2024-02-10 NOTE — Progress Notes (Signed)
 Respiratory Progress Note Room #:5E25  Admit Date:12/15/2023  Pulmonologist:[facility]  Discharge Date:  DX/HX:Radiation fibrosis, lung CA, end stage COPD, CKD  Isolation:None  Code Status:DNR   Airway Type? (Trach/ETT):  Size:  Cuffed/Cuffless?:  Placement at lip?:  High Risk Airway?:  Date of insertion:  Trach changed:  PMV started:  Cap started:  Decannulated/extubated:   Ventilator ID Number: Current ventilator settings:  Current weaning orders:  Date of first wean:  Date of liberation from vent:   Non-invasive Ventilation Current settings:   Home use? Yes/no:  Frequency? HS/PRN/Continuous:   Oxygen  Therapy Modality? Nasal Cannula/NRB/Trach Collar/etc.: HFNC  FiO2:  LPM: 12L   Treatments Drug: Dose: Frequency: Other:  Duoneb 3mL TID   Pulmicort  0.5mg  BID               RT Progress Date Time Important Event or Update RT Initials  12/16/2023 1237 10L HFNC. Oxymizer on standby HL  12/16/2023 1805 Decreased to 8L HFNC, tried to use Oxymizer but pt refused saying it was uncomfortable CD  12/17/2023 0306 8L HFNC, Standby Oxymizer OO  12/17/2023 1755 8L HFNC, pt asking to get up and shave but Sats 90 when pt is sitting on side of bed, dyspnea w/exertion CD  12/18/2023 0449 8L HFNC, stated can not tolerate oxymizer. OO  12/18/2023 1709 8L HFNC CMG  12/19/2023 0447 8L HFNC OO  12/19/2023 1720 9L HFNC, pt states uses between 8-10LPM at home refuses to use Oxymizer  CMG  12/20/23 0548 9/lpm HFNC SAP  12/20/2023 1701 9L HFNC NP  12/21/23 0020 9L HFNC NP  12/21/2023 1215 9L HFNC, New order bipap at night (pt states he has home machine but unable to have machine brought to hospital) NP  12/22/23 0534 9/lpm HFNC. Refused bipap SAP  12/22/2023 1826 9L HFNC, stated refused bipap/cpap due to room being too hot CMG  12/23/23 0614 Refused bipap. No reason given. Had to increase to 12/lpm HFNC d/t desaturation SAP  12/23/2023 1837 HFNC AT 10 LPM JM  12/24/2023 0409 10L HFNC,  refuses to wear BIPAP. OO  12/24/2023 1523 10/25A wears HFNC 8-10LPM currently on 10lpm JM  12/25/2023 0425 8L HFNC, refuses to wear BIPAP OO  12/25/2023 1816 10 LPM HFNC JM  12/26/2023 0459 10 L HFNC. Refuses BIPAP OO  12/26/2023 0831 10 L HFNC BP  12/27/23 0226 10/lpm HFNC. Refused bipap SAP  12/27/2023 0844 12L HFNC BP  12/28/2023 1746 12L HFNC with humidification. HL  12/29/2023 1811 Remains on HFNC 12 L with a NRB to use as needed. HL  12/30/2023 1840 Patient remains on 12L HFNC with humidification. NRB on hand to use PRN. HL  12/31/2023 0438 12 L HFNC with humidification. Refuses BiPAP, refuses to use HFA prn. Complains shortness of breath. OO  12/31/2023 1740 12L HFNC with NRB as needed CD  01/01/2024 0520 12L HFNC with NRB as needed OO  01/01/2024 0843 12L HFNC w/NRB as needed BP  01/02/2024 0255 12L HFNC w/NRB as needed. Refuses BiPAP unit. OO  01/02/2024 0846 12L HFNC+ NRB PRN BP  01/03/2024 0756 12L HFNC+NRB PRN, Bipap Dc'd,changed to DNR BP  01/04/2024 1644 12L HFNC w/NRB PRN CMG  01/05/2024 1740 HFNC 12L, with NRB as needed CD  01/06/2024 1825 HFNC 12L, NRB as needed CD  01/08/2024 0355 HFNC 8L, NRB as needed OO  01/08/2024 0847 8L HFNC+NRB as needed BP  01/09/2024 0444 8L HFNC+NRB as needed. OO  01/09/2024 0619 8L HFNC+NRB as needed BP  01/10/2024 1903 8L HFNC +  NRB prn  CMG  01/11/2024 1543 8L HFNC + NRB prn CMG  01/12/2024 1608 8L HFNC +NRB prn CMG  01/13/2024 1837 9 LPM  HFNC AND NRB PRN               JM  01/15/2024 0441 9 L HFNC with NRB PRN at bed side. OO  01/15/2024 1620 9 L HFNC, NRB standby JM  01/16/2024 0443 9 L HFNC+ PRN NRB OO  01/16/2024 1605 HFNC 9L, NRB stdby CMG  01/17/2024 0027 9L HFNC, NRB on standby LN  01/17/2024 1021 9L HFNC, NRB on standby NP  01/18/2024 1026 9L HFNC, NRB at bedside. Pt refuses to use incentive spirometer.  NP  01/19/2024 1750 HFNC 9L, NRB as needed CD  01/20/2024 1857 HFNC 9L, NRB as needed JM  01/21/2024 0519 HFNC 9L, NRB as needed OO   01/21/2024 1837 HFNC AT 9 LPM, NRB AS NEEDED JM  01/22/2024 0423 HFNC @ 9L + NRB as needed OO  01/22/2024 1805 HFNC at 10 lpm, NRB AS  NEEDED JM  01/23/2024 1738 Patient had an episode of desaturation at 73% on 10L and a NRB, with tachycardia. Placed on HHFNC 100%, 40L at 1300. Tolerating well. HL  01/24/2024 0055 Heated High Flow [place] 40L/100%  DS  01/24/2024 0606 HHFNC 40L/100% BP  01/23/2024 0517 HHFNC 40L/100% KN  01/25/2024 1740 HHFNC 35L/100% CMG  01/26/2024 0558 HHFNC 35/100% OO  01/26/2024 1728 HHFNC 35L/100% CMG  01/27/2024 0600 HHFNC 35L/100% KN  01/27/2024 1700 HHFNC 35LPM/80% FIO2 JM  01/28/2024 0514 HHFNC 80% 35L KN  01/28/2024 1811 HHFNC 25 LPM/60% FIO2 JM  01/29/2024 0554 HHFNC 25L 70% KN  01/29/2024 1851 HHFNC 25L/80%--complains about flow CMG  01/30/2024 0509 HHFNC 25L/80% DS  01/30/2024 1728 HHFNC 25LPM/80% CMG  01/31/2024 0041 HHFNC 25L/80% DS  01/31/2024 1128 HHFNC 25L/80% NP  02/01/2024 0031 15L High Flow Nasal Cannula DS  02/02/2024 0515 13L High Flow Nasal Cannula + Prn NRB OO  02/02/2024 1735 HFNC 12L with NRB as needed, Rapid called today due to HR 140 (pt tried to stand to use urinal first, then bedside commode, and HR went up), pt stable now resting, pt phone was missing then found in trash, wiped down and given back to pt CD  02/02/2024 0504 HFNC 12L KN  02/03/2024 1730 HFNC 12L w/ NRB as needed CD  02/04/2024 0525 HFNC 12L W/ NRB PRN KN  02/04/2024 1725 HFNC 12L w/ NRB as needed CD  02/05/2024 0532 HFNC 12L NRB PRN KN  02/05/2024 1742 HFNC 12L, NRB PRN HL  02/05/2024 1130 HFNC 12L, NRB PRN. Refused 1900 treatment LN  02/06/2024 1728 HFNC 12L with NRB used PRN. HL  02/07/2024 0426 HFNC 12L with NRB used PRN. DS  02/08/2024 0324 HFNC 12L with NRB used PRN. DS  02/08/2024 1506 HFNC 12L with NRB PRN LN  02/09/2024 0429 HFNC 12L with NRB PRN. OO  02/09/2024 1745 HFNC 12L, NRB as needed CD  02/10/2024 0555 HFNC 12Lwith PRN NRB. OO

## 2024-02-11 ENCOUNTER — Institutional Professional Consult (permissible substitution) (HOSPITAL_COMMUNITY)

## 2024-02-12 ENCOUNTER — Institutional Professional Consult (permissible substitution) (HOSPITAL_COMMUNITY)

## 2024-02-12 LAB — BASIC METABOLIC PANEL WITH GFR
Anion gap: 14 (ref 5–15)
BUN: 21 mg/dL (ref 8–23)
CO2: 28 mmol/L (ref 22–32)
Calcium: 8.7 mg/dL — ABNORMAL LOW (ref 8.9–10.3)
Chloride: 93 mmol/L — ABNORMAL LOW (ref 98–111)
Creatinine, Ser: 0.9 mg/dL (ref 0.61–1.24)
GFR, Estimated: >60 mL/min (ref >60)
Glucose, Bld: 227 mg/dL — ABNORMAL HIGH (ref 70–99)
Potassium: 5.2 mmol/L — ABNORMAL HIGH (ref 3.5–5.1)
Sodium: 135 mmol/L (ref 135–145)

## 2024-02-12 LAB — URINALYSIS, ROUTINE W REFLEX MICROSCOPIC
Bilirubin Urine: NEGATIVE
Glucose, UA: >=500 mg/dL — AB
Ketones, ur: 20 mg/dL — AB
Leukocytes,Ua: NEGATIVE
Nitrite: NEGATIVE
Protein, ur: 100 mg/dL — AB
Specific Gravity, Urine: 1.02 (ref 1.005–1.030)
pH: 5 (ref 5.0–8.0)

## 2024-02-12 LAB — CBC WITH DIFFERENTIAL/PLATELET
Abs Immature Granulocytes: 0.11 K/uL — ABNORMAL HIGH (ref 0.00–0.07)
Basophils Absolute: 0.1 K/uL (ref 0.0–0.1)
Basophils Relative: 0 %
Eosinophils Absolute: 0 K/uL (ref 0.0–0.5)
Eosinophils Relative: 0 %
HCT: 42.2 % (ref 39.0–52.0)
Hemoglobin: 14.2 g/dL (ref 13.0–17.0)
Immature Granulocytes: 1 %
Lymphocytes Relative: 14 %
Lymphs Abs: 1.6 K/uL (ref 0.7–4.0)
MCH: 30.8 pg (ref 26.0–34.0)
MCHC: 33.6 g/dL (ref 30.0–36.0)
MCV: 91.5 fL (ref 80.0–100.0)
Monocytes Absolute: 1.3 K/uL — ABNORMAL HIGH (ref 0.1–1.0)
Monocytes Relative: 11 %
Neutro Abs: 8.6 K/uL — ABNORMAL HIGH (ref 1.7–7.7)
Neutrophils Relative %: 74 %
Platelets: 228 K/uL (ref 150–400)
RBC: 4.61 MIL/uL (ref 4.22–5.81)
RDW: 14.9 % (ref 11.5–15.5)
Smear Review: NORMAL
WBC: 11.7 K/uL — ABNORMAL HIGH (ref 4.0–10.5)
nRBC: 0 % (ref 0.0–0.2)

## 2024-02-12 LAB — LACTATE DEHYDROGENASE: LDH: 236 U/L — ABNORMAL HIGH (ref 105–235)

## 2024-02-13 DIAGNOSIS — C7800 Secondary malignant neoplasm of unspecified lung: Secondary | ICD-10-CM

## 2024-02-13 DIAGNOSIS — J449 Chronic obstructive pulmonary disease, unspecified: Secondary | ICD-10-CM

## 2024-02-13 DIAGNOSIS — J9621 Acute and chronic respiratory failure with hypoxia: Secondary | ICD-10-CM

## 2024-02-13 DIAGNOSIS — J90 Pleural effusion, not elsewhere classified: Secondary | ICD-10-CM

## 2024-02-13 LAB — BLOOD CULTURE ID PANEL (REFLEXED) - BCID2
A.calcoaceticus-baumannii: NOT DETECTED
A.calcoaceticus-baumannii: NOT DETECTED
Bacteroides fragilis: NOT DETECTED
Bacteroides fragilis: NOT DETECTED
Candida albicans: NOT DETECTED
Candida albicans: NOT DETECTED
Candida auris: NOT DETECTED
Candida auris: NOT DETECTED
Candida glabrata: NOT DETECTED
Candida glabrata: NOT DETECTED
Candida krusei: NOT DETECTED
Candida krusei: NOT DETECTED
Candida parapsilosis: NOT DETECTED
Candida parapsilosis: NOT DETECTED
Candida tropicalis: NOT DETECTED
Candida tropicalis: NOT DETECTED
Cryptococcus neoformans/gattii: NOT DETECTED
Cryptococcus neoformans/gattii: NOT DETECTED
Enterobacter cloacae complex: NOT DETECTED
Enterobacter cloacae complex: NOT DETECTED
Enterobacterales: NOT DETECTED
Enterobacterales: NOT DETECTED
Enterococcus Faecium: NOT DETECTED
Enterococcus Faecium: NOT DETECTED
Enterococcus faecalis: NOT DETECTED
Enterococcus faecalis: NOT DETECTED
Escherichia coli: NOT DETECTED
Escherichia coli: NOT DETECTED
Haemophilus influenzae: NOT DETECTED
Haemophilus influenzae: NOT DETECTED
Klebsiella aerogenes: NOT DETECTED
Klebsiella aerogenes: NOT DETECTED
Klebsiella oxytoca: NOT DETECTED
Klebsiella oxytoca: NOT DETECTED
Klebsiella pneumoniae: NOT DETECTED
Klebsiella pneumoniae: NOT DETECTED
Listeria monocytogenes: NOT DETECTED
Listeria monocytogenes: NOT DETECTED
Meth resistant mecA/C and MREJ: DETECTED — AB
Methicillin resistance mecA/C: DETECTED — AB
Neisseria meningitidis: NOT DETECTED
Neisseria meningitidis: NOT DETECTED
Proteus species: NOT DETECTED
Proteus species: NOT DETECTED
Pseudomonas aeruginosa: NOT DETECTED
Pseudomonas aeruginosa: NOT DETECTED
Salmonella species: NOT DETECTED
Salmonella species: NOT DETECTED
Serratia marcescens: NOT DETECTED
Serratia marcescens: NOT DETECTED
Staphylococcus aureus (BCID): DETECTED — AB
Staphylococcus aureus (BCID): NOT DETECTED
Staphylococcus epidermidis: NOT DETECTED
Staphylococcus epidermidis: DETECTED — AB
Staphylococcus lugdunensis: NOT DETECTED
Staphylococcus lugdunensis: NOT DETECTED
Staphylococcus species: DETECTED — AB
Staphylococcus species: DETECTED — AB
Stenotrophomonas maltophilia: NOT DETECTED
Stenotrophomonas maltophilia: NOT DETECTED
Streptococcus agalactiae: NOT DETECTED
Streptococcus agalactiae: NOT DETECTED
Streptococcus pneumoniae: NOT DETECTED
Streptococcus pneumoniae: NOT DETECTED
Streptococcus pyogenes: NOT DETECTED
Streptococcus pyogenes: NOT DETECTED
Streptococcus species: NOT DETECTED
Streptococcus species: NOT DETECTED

## 2024-02-13 LAB — CBC WITH DIFFERENTIAL/PLATELET
Abs Immature Granulocytes: 0.15 K/uL — ABNORMAL HIGH (ref 0.00–0.07)
Basophils Absolute: 0 K/uL (ref 0.0–0.1)
Basophils Relative: 0 %
Eosinophils Absolute: 0.1 K/uL (ref 0.0–0.5)
Eosinophils Relative: 1 %
HCT: 45.5 % (ref 39.0–52.0)
Hemoglobin: 15.4 g/dL (ref 13.0–17.0)
Immature Granulocytes: 1 %
Lymphocytes Relative: 8 %
Lymphs Abs: 0.9 K/uL (ref 0.7–4.0)
MCH: 30.9 pg (ref 26.0–34.0)
MCHC: 33.8 g/dL (ref 30.0–36.0)
MCV: 91.4 fL (ref 80.0–100.0)
Monocytes Absolute: 1.1 K/uL — ABNORMAL HIGH (ref 0.1–1.0)
Monocytes Relative: 10 %
Neutro Abs: 8.5 K/uL — ABNORMAL HIGH (ref 1.7–7.7)
Neutrophils Relative %: 80 %
Platelets: 199 K/uL (ref 150–400)
RBC: 4.98 MIL/uL (ref 4.22–5.81)
RDW: 14.7 % (ref 11.5–15.5)
Smear Review: NORMAL
WBC: 10.7 K/uL — ABNORMAL HIGH (ref 4.0–10.5)
nRBC: 0 % (ref 0.0–0.2)

## 2024-02-13 LAB — COMPREHENSIVE METABOLIC PANEL WITH GFR
ALT: 25 U/L (ref 0–44)
AST: 31 U/L (ref 15–41)
Albumin: 2.1 g/dL — ABNORMAL LOW (ref 3.5–5.0)
Alkaline Phosphatase: 46 U/L (ref 38–126)
Anion gap: 11 (ref 5–15)
BUN: 23 mg/dL (ref 8–23)
CO2: 26 mmol/L (ref 22–32)
Calcium: 8.4 mg/dL — ABNORMAL LOW (ref 8.9–10.3)
Chloride: 97 mmol/L — ABNORMAL LOW (ref 98–111)
Creatinine, Ser: 0.91 mg/dL (ref 0.61–1.24)
GFR, Estimated: >60 mL/min (ref >60)
Glucose, Bld: 181 mg/dL — ABNORMAL HIGH (ref 70–99)
Potassium: 5.3 mmol/L — ABNORMAL HIGH (ref 3.5–5.1)
Sodium: 134 mmol/L — ABNORMAL LOW (ref 135–145)
Total Bilirubin: 1.5 mg/dL — ABNORMAL HIGH (ref 0.0–1.2)
Total Protein: 5.8 g/dL — ABNORMAL LOW (ref 6.5–8.1)

## 2024-02-13 LAB — LACTIC ACID, PLASMA: Lactic Acid, Venous: 2.8 mmol/L (ref 0.5–1.9)

## 2024-02-15 LAB — CULTURE, BLOOD (SINGLE): Special Requests: ADEQUATE

## 2024-02-15 LAB — CARBAPENEM RESISTANCE PANEL
Carba Resistance IMP Gene: NOT DETECTED
Carba Resistance KPC Gene: DETECTED — AB
Carba Resistance NDM Gene: NOT DETECTED
Carba Resistance OXA48 Gene: NOT DETECTED
Carba Resistance VIM Gene: NOT DETECTED

## 2024-02-20 LAB — MIC RESULT

## 2024-02-20 LAB — MINIMUM INHIBITORY CONC. (1 DRUG)

## 2024-02-28 LAB — CULTURE, BLOOD (SINGLE): Special Requests: ADEQUATE

## 2024-02-29 DEATH — deceased

## 2024-03-26 LAB — URINE CULTURE: Culture: >=100000 — AB
